# Patient Record
Sex: Female | Born: 1950 | Race: Black or African American | Hispanic: No | Marital: Single
Health system: Midwestern US, Community
[De-identification: ages and names within clinical notes are randomized; demographics above are authoritative.]

## PROBLEM LIST (undated history)

## (undated) DIAGNOSIS — G4730 Sleep apnea, unspecified: Secondary | ICD-10-CM

## (undated) DIAGNOSIS — J449 Chronic obstructive pulmonary disease, unspecified: Secondary | ICD-10-CM

## (undated) DIAGNOSIS — G459 Transient cerebral ischemic attack, unspecified: Secondary | ICD-10-CM

## (undated) DIAGNOSIS — E079 Disorder of thyroid, unspecified: Secondary | ICD-10-CM

## (undated) DIAGNOSIS — E6609 Other obesity due to excess calories: Secondary | ICD-10-CM

## (undated) DIAGNOSIS — G894 Chronic pain syndrome: Secondary | ICD-10-CM

## (undated) DIAGNOSIS — Z9289 Personal history of other medical treatment: Secondary | ICD-10-CM

## (undated) DIAGNOSIS — Z8719 Personal history of other diseases of the digestive system: Secondary | ICD-10-CM

## (undated) DIAGNOSIS — R51 Headache: Secondary | ICD-10-CM

## (undated) DIAGNOSIS — I10 Essential (primary) hypertension: Secondary | ICD-10-CM

## (undated) DIAGNOSIS — R519 Headache, unspecified: Secondary | ICD-10-CM

## (undated) DIAGNOSIS — R0600 Dyspnea, unspecified: Secondary | ICD-10-CM

## (undated) DIAGNOSIS — R252 Cramp and spasm: Secondary | ICD-10-CM

## (undated) DIAGNOSIS — M1990 Unspecified osteoarthritis, unspecified site: Secondary | ICD-10-CM

## (undated) DIAGNOSIS — E785 Hyperlipidemia, unspecified: Secondary | ICD-10-CM

## (undated) DIAGNOSIS — N189 Chronic kidney disease, unspecified: Secondary | ICD-10-CM

## (undated) DIAGNOSIS — J45909 Unspecified asthma, uncomplicated: Secondary | ICD-10-CM

## (undated) DIAGNOSIS — N393 Stress incontinence (female) (male): Secondary | ICD-10-CM

## (undated) DIAGNOSIS — J40 Bronchitis, not specified as acute or chronic: Secondary | ICD-10-CM

## (undated) DIAGNOSIS — K219 Gastro-esophageal reflux disease without esophagitis: Secondary | ICD-10-CM

## (undated) DIAGNOSIS — Z96649 Presence of unspecified artificial hip joint: Secondary | ICD-10-CM

## (undated) DIAGNOSIS — Z9889 Other specified postprocedural states: Secondary | ICD-10-CM

## (undated) DIAGNOSIS — Z972 Presence of dental prosthetic device (complete) (partial): Secondary | ICD-10-CM

## (undated) DIAGNOSIS — D649 Anemia, unspecified: Secondary | ICD-10-CM

## (undated) DIAGNOSIS — E66812 Obesity, class 2: Secondary | ICD-10-CM

## (undated) DIAGNOSIS — I739 Peripheral vascular disease, unspecified: Secondary | ICD-10-CM

## (undated) DIAGNOSIS — E119 Type 2 diabetes mellitus without complications: Secondary | ICD-10-CM

## (undated) DIAGNOSIS — E039 Hypothyroidism, unspecified: Secondary | ICD-10-CM

## (undated) DIAGNOSIS — K08109 Complete loss of teeth, unspecified cause, unspecified class: Secondary | ICD-10-CM

## (undated) DIAGNOSIS — I509 Heart failure, unspecified: Secondary | ICD-10-CM

## (undated) DIAGNOSIS — T84038A Mechanical loosening of other internal prosthetic joint, initial encounter: Secondary | ICD-10-CM

## (undated) DIAGNOSIS — R9439 Abnormal result of other cardiovascular function study: Secondary | ICD-10-CM

## (undated) DIAGNOSIS — I5032 Chronic diastolic (congestive) heart failure: Secondary | ICD-10-CM

## (undated) DIAGNOSIS — Z8614 Personal history of Methicillin resistant Staphylococcus aureus infection: Secondary | ICD-10-CM

## (undated) DIAGNOSIS — Z973 Presence of spectacles and contact lenses: Secondary | ICD-10-CM

## (undated) DIAGNOSIS — Z6836 Body mass index (BMI) 36.0-36.9, adult: Secondary | ICD-10-CM

## (undated) HISTORY — DX: Chronic pain syndrome: G89.4

## (undated) HISTORY — DX: Morbid (severe) obesity due to excess calories: E66.01

## (undated) HISTORY — DX: Anemia, unspecified: D64.9

## (undated) HISTORY — PX: DILATION AND CURETTAGE OF UTERUS: SHX78

## (undated) HISTORY — DX: Peripheral vascular disease, unspecified: I73.9

## (undated) HISTORY — PX: THYROIDECTOMY: SHX17

## (undated) HISTORY — DX: Hypothyroidism, unspecified: E03.9

## (undated) HISTORY — DX: Personal history of Methicillin resistant Staphylococcus aureus infection: Z86.14

## (undated) HISTORY — PX: MULTIPLE TOOTH EXTRACTIONS: SHX2053

## (undated) HISTORY — PX: HERNIA REPAIR: SHX51

## (undated) HISTORY — PX: FOREARM FRACTURE SURGERY: SHX649

## (undated) HISTORY — DX: Disorder of thyroid, unspecified: E07.9

## (undated) HISTORY — DX: Transient cerebral ischemic attack, unspecified: G45.9

## (undated) HISTORY — DX: Unspecified osteoarthritis, unspecified site: M19.90

## (undated) HISTORY — DX: Hyperlipidemia, unspecified: E78.5

## (undated) HISTORY — PX: ABDOMINAL HYSTERECTOMY: SHX81

## (undated) HISTORY — PX: LAPAROSCOPIC CHOLECYSTECTOMY: SUR755

## (undated) HISTORY — DX: Bronchitis, not specified as acute or chronic: J40

## (undated) HISTORY — DX: Essential (primary) hypertension: I10

## (undated) HISTORY — PX: VASCULAR SURGERY: SHX849

## (undated) HISTORY — DX: Gastro-esophageal reflux disease without esophagitis: K21.9

## (undated) HISTORY — DX: Abnormal result of other cardiovascular function study: R94.39

## (undated) HISTORY — PX: JOINT REPLACEMENT: SHX530

---

## 1993-10-08 HISTORY — PX: COLON SURGERY: SHX602

## 2008-08-10 ENCOUNTER — Encounter: Admission: RE | Admit: 2008-08-10 | Discharge: 2008-08-10 | Payer: Self-pay | Admitting: Internal Medicine

## 2008-11-28 ENCOUNTER — Inpatient Hospital Stay (HOSPITAL_COMMUNITY): Admission: EM | Admit: 2008-11-28 | Discharge: 2008-12-16 | Payer: Self-pay | Admitting: *Deleted

## 2008-11-29 ENCOUNTER — Encounter (INDEPENDENT_AMBULATORY_CARE_PROVIDER_SITE_OTHER): Payer: Self-pay | Admitting: General Surgery

## 2009-08-05 ENCOUNTER — Inpatient Hospital Stay (HOSPITAL_COMMUNITY): Admission: RE | Admit: 2009-08-05 | Discharge: 2009-08-09 | Payer: Self-pay | Admitting: Orthopaedic Surgery

## 2009-08-15 ENCOUNTER — Inpatient Hospital Stay (HOSPITAL_COMMUNITY): Admission: EM | Admit: 2009-08-15 | Discharge: 2009-08-19 | Payer: Self-pay | Admitting: Emergency Medicine

## 2009-09-15 ENCOUNTER — Encounter: Admission: RE | Admit: 2009-09-15 | Discharge: 2009-09-15 | Payer: Self-pay | Admitting: Internal Medicine

## 2010-03-15 ENCOUNTER — Ambulatory Visit: Payer: Self-pay | Admitting: Vascular Surgery

## 2010-06-21 ENCOUNTER — Inpatient Hospital Stay (HOSPITAL_COMMUNITY): Admission: EM | Admit: 2010-06-21 | Discharge: 2010-07-01 | Payer: Self-pay | Admitting: Emergency Medicine

## 2010-06-23 ENCOUNTER — Ambulatory Visit: Payer: Self-pay | Admitting: Surgery

## 2010-08-10 ENCOUNTER — Ambulatory Visit: Payer: Self-pay | Admitting: Vascular Surgery

## 2010-09-19 ENCOUNTER — Encounter
Admission: RE | Admit: 2010-09-19 | Discharge: 2010-09-19 | Payer: Self-pay | Source: Home / Self Care | Attending: Internal Medicine | Admitting: Internal Medicine

## 2010-09-21 ENCOUNTER — Ambulatory Visit: Payer: Self-pay | Admitting: Vascular Surgery

## 2010-10-29 ENCOUNTER — Encounter: Payer: Self-pay | Admitting: Internal Medicine

## 2010-11-09 ENCOUNTER — Other Ambulatory Visit: Payer: Self-pay | Admitting: Orthopaedic Surgery

## 2010-11-09 ENCOUNTER — Other Ambulatory Visit (HOSPITAL_COMMUNITY): Payer: Self-pay | Admitting: Orthopaedic Surgery

## 2010-11-09 DIAGNOSIS — Z96649 Presence of unspecified artificial hip joint: Secondary | ICD-10-CM

## 2010-11-09 DIAGNOSIS — M25551 Pain in right hip: Secondary | ICD-10-CM

## 2010-11-09 DIAGNOSIS — T84038A Mechanical loosening of other internal prosthetic joint, initial encounter: Secondary | ICD-10-CM

## 2010-11-17 ENCOUNTER — Other Ambulatory Visit: Payer: Self-pay | Admitting: Internal Medicine

## 2010-11-17 DIAGNOSIS — Z1231 Encounter for screening mammogram for malignant neoplasm of breast: Secondary | ICD-10-CM

## 2010-11-21 ENCOUNTER — Encounter (HOSPITAL_COMMUNITY)
Admission: RE | Admit: 2010-11-21 | Discharge: 2010-11-21 | Disposition: A | Discharge status: Home / Self Care | Payer: Medicare Other | Source: Ambulatory Visit | Attending: Orthopaedic Surgery | Admitting: Orthopaedic Surgery

## 2010-11-21 ENCOUNTER — Ambulatory Visit (HOSPITAL_COMMUNITY)
Admission: RE | Admit: 2010-11-21 | Discharge: 2010-11-21 | Disposition: A | Discharge status: Home / Self Care | Payer: Medicare Other | Source: Ambulatory Visit | Attending: Orthopaedic Surgery | Admitting: Orthopaedic Surgery

## 2010-11-21 ENCOUNTER — Ambulatory Visit
Admission: RE | Admit: 2010-11-21 | Discharge: 2010-11-21 | Disposition: A | Discharge status: Home / Self Care | Payer: Medicare Other | Source: Ambulatory Visit | Attending: Orthopaedic Surgery | Admitting: Orthopaedic Surgery

## 2010-11-21 ENCOUNTER — Encounter (HOSPITAL_COMMUNITY): Payer: Self-pay

## 2010-11-21 DIAGNOSIS — M25551 Pain in right hip: Secondary | ICD-10-CM

## 2010-11-21 DIAGNOSIS — T84038A Mechanical loosening of other internal prosthetic joint, initial encounter: Secondary | ICD-10-CM

## 2010-11-21 DIAGNOSIS — M25559 Pain in unspecified hip: Secondary | ICD-10-CM | POA: Insufficient documentation

## 2010-11-21 DIAGNOSIS — Z96649 Presence of unspecified artificial hip joint: Secondary | ICD-10-CM | POA: Insufficient documentation

## 2010-11-21 HISTORY — DX: Presence of unspecified artificial hip joint: Z96.649

## 2010-11-21 MED ORDER — TECHNETIUM TC 99M MEDRONATE IV KIT
24.0000 | PACK | Freq: Once | INTRAVENOUS | Status: AC | PRN
  Administered 2010-11-21: 24 via INTRAVENOUS

## 2010-11-23 ENCOUNTER — Ambulatory Visit
Admission: RE | Admit: 2010-11-23 | Discharge: 2010-11-23 | Disposition: A | Discharge status: Home / Self Care | Payer: Medicare Other | Source: Ambulatory Visit | Attending: Internal Medicine | Admitting: Internal Medicine

## 2010-11-23 DIAGNOSIS — Z1231 Encounter for screening mammogram for malignant neoplasm of breast: Secondary | ICD-10-CM

## 2010-11-29 ENCOUNTER — Other Ambulatory Visit: Payer: Self-pay | Admitting: Orthopaedic Surgery

## 2010-11-29 DIAGNOSIS — M4316 Spondylolisthesis, lumbar region: Secondary | ICD-10-CM

## 2010-12-06 ENCOUNTER — Ambulatory Visit
Admission: RE | Admit: 2010-12-06 | Discharge: 2010-12-06 | Disposition: A | Discharge status: Home / Self Care | Payer: Medicare Other | Source: Ambulatory Visit | Attending: Orthopaedic Surgery | Admitting: Orthopaedic Surgery

## 2010-12-06 DIAGNOSIS — M4316 Spondylolisthesis, lumbar region: Secondary | ICD-10-CM

## 2010-12-21 ENCOUNTER — Ambulatory Visit: Payer: Self-pay | Admitting: Vascular Surgery

## 2010-12-21 LAB — GLUCOSE, CAPILLARY
Glucose-Capillary: 111 mg/dL — ABNORMAL HIGH (ref 70–99)
Glucose-Capillary: 116 mg/dL — ABNORMAL HIGH (ref 70–99)
Glucose-Capillary: 116 mg/dL — ABNORMAL HIGH (ref 70–99)
Glucose-Capillary: 117 mg/dL — ABNORMAL HIGH (ref 70–99)
Glucose-Capillary: 124 mg/dL — ABNORMAL HIGH (ref 70–99)
Glucose-Capillary: 124 mg/dL — ABNORMAL HIGH (ref 70–99)
Glucose-Capillary: 128 mg/dL — ABNORMAL HIGH (ref 70–99)
Glucose-Capillary: 130 mg/dL — ABNORMAL HIGH (ref 70–99)
Glucose-Capillary: 131 mg/dL — ABNORMAL HIGH (ref 70–99)
Glucose-Capillary: 133 mg/dL — ABNORMAL HIGH (ref 70–99)
Glucose-Capillary: 136 mg/dL — ABNORMAL HIGH (ref 70–99)
Glucose-Capillary: 137 mg/dL — ABNORMAL HIGH (ref 70–99)
Glucose-Capillary: 138 mg/dL — ABNORMAL HIGH (ref 70–99)
Glucose-Capillary: 142 mg/dL — ABNORMAL HIGH (ref 70–99)
Glucose-Capillary: 148 mg/dL — ABNORMAL HIGH (ref 70–99)
Glucose-Capillary: 150 mg/dL — ABNORMAL HIGH (ref 70–99)
Glucose-Capillary: 159 mg/dL — ABNORMAL HIGH (ref 70–99)
Glucose-Capillary: 159 mg/dL — ABNORMAL HIGH (ref 70–99)
Glucose-Capillary: 160 mg/dL — ABNORMAL HIGH (ref 70–99)
Glucose-Capillary: 164 mg/dL — ABNORMAL HIGH (ref 70–99)
Glucose-Capillary: 172 mg/dL — ABNORMAL HIGH (ref 70–99)
Glucose-Capillary: 173 mg/dL — ABNORMAL HIGH (ref 70–99)
Glucose-Capillary: 174 mg/dL — ABNORMAL HIGH (ref 70–99)
Glucose-Capillary: 177 mg/dL — ABNORMAL HIGH (ref 70–99)
Glucose-Capillary: 183 mg/dL — ABNORMAL HIGH (ref 70–99)
Glucose-Capillary: 187 mg/dL — ABNORMAL HIGH (ref 70–99)
Glucose-Capillary: 196 mg/dL — ABNORMAL HIGH (ref 70–99)
Glucose-Capillary: 198 mg/dL — ABNORMAL HIGH (ref 70–99)
Glucose-Capillary: 199 mg/dL — ABNORMAL HIGH (ref 70–99)
Glucose-Capillary: 201 mg/dL — ABNORMAL HIGH (ref 70–99)
Glucose-Capillary: 207 mg/dL — ABNORMAL HIGH (ref 70–99)
Glucose-Capillary: 210 mg/dL — ABNORMAL HIGH (ref 70–99)
Glucose-Capillary: 223 mg/dL — ABNORMAL HIGH (ref 70–99)
Glucose-Capillary: 252 mg/dL — ABNORMAL HIGH (ref 70–99)
Glucose-Capillary: 326 mg/dL — ABNORMAL HIGH (ref 70–99)
Glucose-Capillary: 348 mg/dL — ABNORMAL HIGH (ref 70–99)
Glucose-Capillary: 361 mg/dL — ABNORMAL HIGH (ref 70–99)
Glucose-Capillary: 89 mg/dL (ref 70–99)
Glucose-Capillary: 94 mg/dL (ref 70–99)
Glucose-Capillary: 186 mg/dL — ABNORMAL HIGH (ref 70–99)
Glucose-Capillary: 190 mg/dL — ABNORMAL HIGH (ref 70–99)
Glucose-Capillary: 198 mg/dL — ABNORMAL HIGH (ref 70–99)

## 2010-12-21 LAB — URINE MICROSCOPIC-ADD ON

## 2010-12-21 LAB — BASIC METABOLIC PANEL
BUN: 12 mg/dL (ref 6–23)
BUN: 12 mg/dL (ref 6–23)
BUN: 14 mg/dL (ref 6–23)
BUN: 19 mg/dL (ref 6–23)
BUN: 19 mg/dL (ref 6–23)
BUN: 20 mg/dL (ref 6–23)
BUN: 22 mg/dL (ref 6–23)
BUN: 28 mg/dL — ABNORMAL HIGH (ref 6–23)
BUN: 30 mg/dL — ABNORMAL HIGH (ref 6–23)
CO2: 28 mEq/L (ref 19–32)
CO2: 29 mEq/L (ref 19–32)
CO2: 29 mEq/L (ref 19–32)
CO2: 31 mEq/L (ref 19–32)
CO2: 31 mEq/L (ref 19–32)
CO2: 32 mEq/L (ref 19–32)
CO2: 32 mEq/L (ref 19–32)
CO2: 33 mEq/L — ABNORMAL HIGH (ref 19–32)
CO2: 33 mEq/L — ABNORMAL HIGH (ref 19–32)
Calcium: 8.4 mg/dL (ref 8.4–10.5)
Calcium: 8.8 mg/dL (ref 8.4–10.5)
Calcium: 8.8 mg/dL (ref 8.4–10.5)
Calcium: 9 mg/dL (ref 8.4–10.5)
Calcium: 9 mg/dL (ref 8.4–10.5)
Calcium: 9 mg/dL (ref 8.4–10.5)
Calcium: 9.1 mg/dL (ref 8.4–10.5)
Calcium: 9.2 mg/dL (ref 8.4–10.5)
Calcium: 9.3 mg/dL (ref 8.4–10.5)
Chloride: 100 mEq/L (ref 96–112)
Chloride: 100 mEq/L (ref 96–112)
Chloride: 100 mEq/L (ref 96–112)
Chloride: 100 mEq/L (ref 96–112)
Chloride: 101 mEq/L (ref 96–112)
Chloride: 101 mEq/L (ref 96–112)
Chloride: 101 mEq/L (ref 96–112)
Chloride: 103 mEq/L (ref 96–112)
Chloride: 99 mEq/L (ref 96–112)
Creatinine, Ser: 1.07 mg/dL (ref 0.4–1.2)
Creatinine, Ser: 1.07 mg/dL (ref 0.4–1.2)
Creatinine, Ser: 1.15 mg/dL (ref 0.4–1.2)
Creatinine, Ser: 1.23 mg/dL — ABNORMAL HIGH (ref 0.4–1.2)
Creatinine, Ser: 1.32 mg/dL — ABNORMAL HIGH (ref 0.4–1.2)
Creatinine, Ser: 1.35 mg/dL — ABNORMAL HIGH (ref 0.4–1.2)
Creatinine, Ser: 1.39 mg/dL — ABNORMAL HIGH (ref 0.4–1.2)
Creatinine, Ser: 1.42 mg/dL — ABNORMAL HIGH (ref 0.4–1.2)
Creatinine, Ser: 1.47 mg/dL — ABNORMAL HIGH (ref 0.4–1.2)
GFR calc Af Amer: 44 mL/min — ABNORMAL LOW (ref >60)
GFR calc Af Amer: 46 mL/min — ABNORMAL LOW (ref >60)
GFR calc Af Amer: 47 mL/min — ABNORMAL LOW (ref >60)
GFR calc Af Amer: 49 mL/min — ABNORMAL LOW (ref >60)
GFR calc Af Amer: 50 mL/min — ABNORMAL LOW (ref >60)
GFR calc Af Amer: 54 mL/min — ABNORMAL LOW (ref >60)
GFR calc Af Amer: 58 mL/min — ABNORMAL LOW (ref >60)
GFR calc Af Amer: >60 mL/min (ref >60)
GFR calc Af Amer: >60 mL/min (ref >60)
GFR calc non Af Amer: 36 mL/min — ABNORMAL LOW (ref >60)
GFR calc non Af Amer: 38 mL/min — ABNORMAL LOW (ref >60)
GFR calc non Af Amer: 39 mL/min — ABNORMAL LOW (ref >60)
GFR calc non Af Amer: 40 mL/min — ABNORMAL LOW (ref >60)
GFR calc non Af Amer: 41 mL/min — ABNORMAL LOW (ref >60)
GFR calc non Af Amer: 45 mL/min — ABNORMAL LOW (ref >60)
GFR calc non Af Amer: 48 mL/min — ABNORMAL LOW (ref >60)
GFR calc non Af Amer: 52 mL/min — ABNORMAL LOW (ref >60)
GFR calc non Af Amer: 52 mL/min — ABNORMAL LOW (ref >60)
Glucose, Bld: 111 mg/dL — ABNORMAL HIGH (ref 70–99)
Glucose, Bld: 146 mg/dL — ABNORMAL HIGH (ref 70–99)
Glucose, Bld: 147 mg/dL — ABNORMAL HIGH (ref 70–99)
Glucose, Bld: 166 mg/dL — ABNORMAL HIGH (ref 70–99)
Glucose, Bld: 181 mg/dL — ABNORMAL HIGH (ref 70–99)
Glucose, Bld: 182 mg/dL — ABNORMAL HIGH (ref 70–99)
Glucose, Bld: 191 mg/dL — ABNORMAL HIGH (ref 70–99)
Glucose, Bld: 194 mg/dL — ABNORMAL HIGH (ref 70–99)
Glucose, Bld: 90 mg/dL (ref 70–99)
Potassium: 3.2 mEq/L — ABNORMAL LOW (ref 3.5–5.1)
Potassium: 3.3 mEq/L — ABNORMAL LOW (ref 3.5–5.1)
Potassium: 3.3 mEq/L — ABNORMAL LOW (ref 3.5–5.1)
Potassium: 3.5 mEq/L (ref 3.5–5.1)
Potassium: 3.6 mEq/L (ref 3.5–5.1)
Potassium: 3.6 mEq/L (ref 3.5–5.1)
Potassium: 3.8 mEq/L (ref 3.5–5.1)
Potassium: 4 mEq/L (ref 3.5–5.1)
Potassium: 4.5 mEq/L (ref 3.5–5.1)
Sodium: 137 mEq/L (ref 135–145)
Sodium: 139 mEq/L (ref 135–145)
Sodium: 140 mEq/L (ref 135–145)
Sodium: 141 mEq/L (ref 135–145)
Sodium: 141 mEq/L (ref 135–145)
Sodium: 142 mEq/L (ref 135–145)
Sodium: 142 mEq/L (ref 135–145)
Sodium: 142 mEq/L (ref 135–145)
Sodium: 143 mEq/L (ref 135–145)

## 2010-12-21 LAB — DIFFERENTIAL
Basophils Absolute: 0 10*3/uL (ref 0.0–0.1)
Basophils Relative: 0 % (ref 0–1)
Eosinophils Absolute: 0.1 10*3/uL (ref 0.0–0.7)
Eosinophils Relative: 1 % (ref 0–5)
Lymphocytes Relative: 15 % (ref 12–46)
Lymphs Abs: 2.6 10*3/uL (ref 0.7–4.0)
Monocytes Absolute: 0.7 10*3/uL (ref 0.1–1.0)
Monocytes Relative: 4 % (ref 3–12)
Neutro Abs: 14 10*3/uL — ABNORMAL HIGH (ref 1.7–7.7)
Neutrophils Relative %: 80 % — ABNORMAL HIGH (ref 43–77)

## 2010-12-21 LAB — URINALYSIS, ROUTINE W REFLEX MICROSCOPIC
Bilirubin Urine: NEGATIVE
Glucose, UA: NEGATIVE mg/dL
Hgb urine dipstick: NEGATIVE
Ketones, ur: NEGATIVE mg/dL
Leukocytes, UA: NEGATIVE
Nitrite: NEGATIVE
Protein, ur: 100 mg/dL — AB
Specific Gravity, Urine: 1.024 (ref 1.005–1.030)
Urobilinogen, UA: 0.2 mg/dL (ref 0.0–1.0)
pH: 6 (ref 5.0–8.0)

## 2010-12-21 LAB — CBC
HCT: 33.4 % — ABNORMAL LOW (ref 36.0–46.0)
HCT: 34.3 % — ABNORMAL LOW (ref 36.0–46.0)
HCT: 35.5 % — ABNORMAL LOW (ref 36.0–46.0)
HCT: 35.7 % — ABNORMAL LOW (ref 36.0–46.0)
HCT: 36.3 % (ref 36.0–46.0)
HCT: 38.2 % (ref 36.0–46.0)
HCT: 39.1 % (ref 36.0–46.0)
HCT: 40.6 % (ref 36.0–46.0)
Hemoglobin: 10.6 g/dL — ABNORMAL LOW (ref 12.0–15.0)
Hemoglobin: 10.8 g/dL — ABNORMAL LOW (ref 12.0–15.0)
Hemoglobin: 11.1 g/dL — ABNORMAL LOW (ref 12.0–15.0)
Hemoglobin: 11.1 g/dL — ABNORMAL LOW (ref 12.0–15.0)
Hemoglobin: 11.3 g/dL — ABNORMAL LOW (ref 12.0–15.0)
Hemoglobin: 11.7 g/dL — ABNORMAL LOW (ref 12.0–15.0)
Hemoglobin: 12.3 g/dL (ref 12.0–15.0)
Hemoglobin: 13.2 g/dL (ref 12.0–15.0)
MCH: 26.2 pg (ref 26.0–34.0)
MCH: 26.3 pg (ref 26.0–34.0)
MCH: 26.5 pg (ref 26.0–34.0)
MCH: 26.8 pg (ref 26.0–34.0)
MCH: 27 pg (ref 26.0–34.0)
MCH: 27 pg (ref 26.0–34.0)
MCH: 27.1 pg (ref 26.0–34.0)
MCH: 27.6 pg (ref 26.0–34.0)
MCHC: 30.6 g/dL (ref 30.0–36.0)
MCHC: 31.1 g/dL (ref 30.0–36.0)
MCHC: 31.1 g/dL (ref 30.0–36.0)
MCHC: 31.3 g/dL (ref 30.0–36.0)
MCHC: 31.5 g/dL (ref 30.0–36.0)
MCHC: 31.5 g/dL (ref 30.0–36.0)
MCHC: 31.7 g/dL (ref 30.0–36.0)
MCHC: 32.5 g/dL (ref 30.0–36.0)
MCV: 84.4 fL (ref 78.0–100.0)
MCV: 84.7 fL (ref 78.0–100.0)
MCV: 85.1 fL (ref 78.0–100.0)
MCV: 85.4 fL (ref 78.0–100.0)
MCV: 85.7 fL (ref 78.0–100.0)
MCV: 85.8 fL (ref 78.0–100.0)
MCV: 86.6 fL (ref 78.0–100.0)
MCV: 84.9 fL (ref 78.0–100.0)
Platelets: 329 10*3/uL (ref 150–400)
Platelets: 332 10*3/uL (ref 150–400)
Platelets: 336 10*3/uL (ref 150–400)
Platelets: 343 10*3/uL (ref 150–400)
Platelets: 353 10*3/uL (ref 150–400)
Platelets: 354 10*3/uL (ref 150–400)
Platelets: 363 10*3/uL (ref 150–400)
Platelets: 400 10*3/uL (ref 150–400)
RBC: 3.91 MIL/uL (ref 3.87–5.11)
RBC: 4.03 MIL/uL (ref 3.87–5.11)
RBC: 4.19 MIL/uL (ref 3.87–5.11)
RBC: 4.19 MIL/uL (ref 3.87–5.11)
RBC: 4.23 MIL/uL (ref 3.87–5.11)
RBC: 4.45 MIL/uL (ref 3.87–5.11)
RBC: 4.56 MIL/uL (ref 3.87–5.11)
RBC: 4.78 MIL/uL (ref 3.87–5.11)
RDW: 14.8 % (ref 11.5–15.5)
RDW: 15 % (ref 11.5–15.5)
RDW: 15.1 % (ref 11.5–15.5)
RDW: 15.2 % (ref 11.5–15.5)
RDW: 15.2 % (ref 11.5–15.5)
RDW: 15.3 % (ref 11.5–15.5)
RDW: 15.4 % (ref 11.5–15.5)
RDW: 14.9 % (ref 11.5–15.5)
WBC: 14 10*3/uL — ABNORMAL HIGH (ref 4.0–10.5)
WBC: 15.8 10*3/uL — ABNORMAL HIGH (ref 4.0–10.5)
WBC: 15.8 10*3/uL — ABNORMAL HIGH (ref 4.0–10.5)
WBC: 16 10*3/uL — ABNORMAL HIGH (ref 4.0–10.5)
WBC: 16.6 10*3/uL — ABNORMAL HIGH (ref 4.0–10.5)
WBC: 17.1 10*3/uL — ABNORMAL HIGH (ref 4.0–10.5)
WBC: 17.2 10*3/uL — ABNORMAL HIGH (ref 4.0–10.5)
WBC: 17.4 10*3/uL — ABNORMAL HIGH (ref 4.0–10.5)

## 2010-12-21 LAB — COMPREHENSIVE METABOLIC PANEL
ALT: 10 U/L (ref 0–35)
AST: 20 U/L (ref 0–37)
Albumin: 3.1 g/dL — ABNORMAL LOW (ref 3.5–5.2)
Alkaline Phosphatase: 101 U/L (ref 39–117)
BUN: 16 mg/dL (ref 6–23)
CO2: 30 mEq/L (ref 19–32)
Calcium: 9.2 mg/dL (ref 8.4–10.5)
Chloride: 97 mEq/L (ref 96–112)
Creatinine, Ser: 1.53 mg/dL — ABNORMAL HIGH (ref 0.4–1.2)
GFR calc Af Amer: 42 mL/min — ABNORMAL LOW (ref >60)
GFR calc non Af Amer: 35 mL/min — ABNORMAL LOW (ref >60)
Glucose, Bld: 199 mg/dL — ABNORMAL HIGH (ref 70–99)
Potassium: 2.8 mEq/L — ABNORMAL LOW (ref 3.5–5.1)
Sodium: 142 mEq/L (ref 135–145)
Total Bilirubin: 0.6 mg/dL (ref 0.3–1.2)
Total Protein: 7.9 g/dL (ref 6.0–8.3)

## 2010-12-21 LAB — CULTURE, BLOOD (ROUTINE X 2)
Culture  Setup Time: 201109150101
Culture  Setup Time: 201109150101
Culture: NO GROWTH
Culture: NO GROWTH

## 2010-12-21 LAB — VANCOMYCIN, TROUGH: Vancomycin Tr: 19.6 ug/mL (ref 10.0–20.0)

## 2010-12-21 LAB — URIC ACID
Uric Acid, Serum: 4.2 mg/dL (ref 2.4–7.0)
Uric Acid, Serum: 4.5 mg/dL (ref 2.4–7.0)

## 2010-12-21 LAB — HEMOGLOBIN A1C
Hgb A1c MFr Bld: 7.2 % — ABNORMAL HIGH (ref <5.7)
Mean Plasma Glucose: 160 mg/dL — ABNORMAL HIGH (ref <117)

## 2010-12-21 LAB — TSH: TSH: 2.141 u[IU]/mL (ref 0.350–4.500)

## 2010-12-21 LAB — MRSA PCR SCREENING: MRSA by PCR: NEGATIVE

## 2010-12-28 ENCOUNTER — Ambulatory Visit: Payer: Medicare Other | Admitting: Vascular Surgery

## 2011-01-10 LAB — GLUCOSE, CAPILLARY
Glucose-Capillary: 106 mg/dL — ABNORMAL HIGH (ref 70–99)
Glucose-Capillary: 109 mg/dL — ABNORMAL HIGH (ref 70–99)
Glucose-Capillary: 113 mg/dL — ABNORMAL HIGH (ref 70–99)
Glucose-Capillary: 121 mg/dL — ABNORMAL HIGH (ref 70–99)
Glucose-Capillary: 122 mg/dL — ABNORMAL HIGH (ref 70–99)
Glucose-Capillary: 136 mg/dL — ABNORMAL HIGH (ref 70–99)
Glucose-Capillary: 139 mg/dL — ABNORMAL HIGH (ref 70–99)
Glucose-Capillary: 139 mg/dL — ABNORMAL HIGH (ref 70–99)
Glucose-Capillary: 143 mg/dL — ABNORMAL HIGH (ref 70–99)
Glucose-Capillary: 146 mg/dL — ABNORMAL HIGH (ref 70–99)
Glucose-Capillary: 150 mg/dL — ABNORMAL HIGH (ref 70–99)
Glucose-Capillary: 156 mg/dL — ABNORMAL HIGH (ref 70–99)
Glucose-Capillary: 163 mg/dL — ABNORMAL HIGH (ref 70–99)
Glucose-Capillary: 175 mg/dL — ABNORMAL HIGH (ref 70–99)
Glucose-Capillary: 177 mg/dL — ABNORMAL HIGH (ref 70–99)
Glucose-Capillary: 196 mg/dL — ABNORMAL HIGH (ref 70–99)
Glucose-Capillary: 197 mg/dL — ABNORMAL HIGH (ref 70–99)
Glucose-Capillary: 200 mg/dL — ABNORMAL HIGH (ref 70–99)
Glucose-Capillary: 213 mg/dL — ABNORMAL HIGH (ref 70–99)
Glucose-Capillary: 284 mg/dL — ABNORMAL HIGH (ref 70–99)
Glucose-Capillary: 87 mg/dL (ref 70–99)
Glucose-Capillary: 88 mg/dL (ref 70–99)

## 2011-01-10 LAB — COMPREHENSIVE METABOLIC PANEL
ALT: 16 U/L (ref 0–35)
AST: 20 U/L (ref 0–37)
Albumin: 1.9 g/dL — ABNORMAL LOW (ref 3.5–5.2)
Alkaline Phosphatase: 105 U/L (ref 39–117)
BUN: 14 mg/dL (ref 6–23)
CO2: 29 mEq/L (ref 19–32)
Calcium: 8.2 mg/dL — ABNORMAL LOW (ref 8.4–10.5)
Chloride: 98 mEq/L (ref 96–112)
Creatinine, Ser: 1.13 mg/dL (ref 0.4–1.2)
GFR calc Af Amer: 60 mL/min — ABNORMAL LOW (ref >60)
GFR calc non Af Amer: 49 mL/min — ABNORMAL LOW (ref >60)
Glucose, Bld: 226 mg/dL — ABNORMAL HIGH (ref 70–99)
Potassium: 2.9 mEq/L — ABNORMAL LOW (ref 3.5–5.1)
Sodium: 134 mEq/L — ABNORMAL LOW (ref 135–145)
Total Bilirubin: 0.6 mg/dL (ref 0.3–1.2)
Total Protein: 5.8 g/dL — ABNORMAL LOW (ref 6.0–8.3)

## 2011-01-10 LAB — CBC
HCT: 23.2 % — ABNORMAL LOW (ref 36.0–46.0)
HCT: 23.8 % — ABNORMAL LOW (ref 36.0–46.0)
HCT: 26 % — ABNORMAL LOW (ref 36.0–46.0)
HCT: 26.7 % — ABNORMAL LOW (ref 36.0–46.0)
HCT: 27 % — ABNORMAL LOW (ref 36.0–46.0)
HCT: 27.4 % — ABNORMAL LOW (ref 36.0–46.0)
Hemoglobin: 7.7 g/dL — ABNORMAL LOW (ref 12.0–15.0)
Hemoglobin: 8 g/dL — ABNORMAL LOW (ref 12.0–15.0)
Hemoglobin: 8.4 g/dL — ABNORMAL LOW (ref 12.0–15.0)
Hemoglobin: 8.8 g/dL — ABNORMAL LOW (ref 12.0–15.0)
Hemoglobin: 9.1 g/dL — ABNORMAL LOW (ref 12.0–15.0)
Hemoglobin: 9.1 g/dL — ABNORMAL LOW (ref 12.0–15.0)
MCHC: 32.5 g/dL (ref 30.0–36.0)
MCHC: 33.1 g/dL (ref 30.0–36.0)
MCHC: 33.1 g/dL (ref 30.0–36.0)
MCHC: 33.2 g/dL (ref 30.0–36.0)
MCHC: 33.6 g/dL (ref 30.0–36.0)
MCHC: 33.7 g/dL (ref 30.0–36.0)
MCV: 83.1 fL (ref 78.0–100.0)
MCV: 83.1 fL (ref 78.0–100.0)
MCV: 83.1 fL (ref 78.0–100.0)
MCV: 84.3 fL (ref 78.0–100.0)
MCV: 85.1 fL (ref 78.0–100.0)
MCV: 85.6 fL (ref 78.0–100.0)
Platelets: 249 10*3/uL (ref 150–400)
Platelets: 251 10*3/uL (ref 150–400)
Platelets: 377 10*3/uL (ref 150–400)
Platelets: 385 10*3/uL (ref 150–400)
Platelets: 490 10*3/uL — ABNORMAL HIGH (ref 150–400)
Platelets: 506 10*3/uL — ABNORMAL HIGH (ref 150–400)
RBC: 2.75 MIL/uL — ABNORMAL LOW (ref 3.87–5.11)
RBC: 2.87 MIL/uL — ABNORMAL LOW (ref 3.87–5.11)
RBC: 3.04 MIL/uL — ABNORMAL LOW (ref 3.87–5.11)
RBC: 3.21 MIL/uL — ABNORMAL LOW (ref 3.87–5.11)
RBC: 3.23 MIL/uL — ABNORMAL LOW (ref 3.87–5.11)
RBC: 3.25 MIL/uL — ABNORMAL LOW (ref 3.87–5.11)
RDW: 14.6 % (ref 11.5–15.5)
RDW: 15 % (ref 11.5–15.5)
RDW: 15 % (ref 11.5–15.5)
RDW: 15.1 % (ref 11.5–15.5)
RDW: 15.1 % (ref 11.5–15.5)
RDW: 15.4 % (ref 11.5–15.5)
WBC: 10.5 10*3/uL (ref 4.0–10.5)
WBC: 12.9 10*3/uL — ABNORMAL HIGH (ref 4.0–10.5)
WBC: 15.5 10*3/uL — ABNORMAL HIGH (ref 4.0–10.5)
WBC: 19.4 10*3/uL — ABNORMAL HIGH (ref 4.0–10.5)
WBC: 19.4 10*3/uL — ABNORMAL HIGH (ref 4.0–10.5)
WBC: 28.4 10*3/uL — ABNORMAL HIGH (ref 4.0–10.5)

## 2011-01-10 LAB — URINE CULTURE: Colony Count: >=100000

## 2011-01-10 LAB — BASIC METABOLIC PANEL
BUN: 15 mg/dL (ref 6–23)
BUN: 19 mg/dL (ref 6–23)
BUN: 20 mg/dL (ref 6–23)
BUN: 24 mg/dL — ABNORMAL HIGH (ref 6–23)
BUN: 25 mg/dL — ABNORMAL HIGH (ref 6–23)
CO2: 25 mEq/L (ref 19–32)
CO2: 25 mEq/L (ref 19–32)
CO2: 26 mEq/L (ref 19–32)
CO2: 26 mEq/L (ref 19–32)
CO2: 27 mEq/L (ref 19–32)
Calcium: 8.1 mg/dL — ABNORMAL LOW (ref 8.4–10.5)
Calcium: 8.2 mg/dL — ABNORMAL LOW (ref 8.4–10.5)
Calcium: 8.4 mg/dL (ref 8.4–10.5)
Calcium: 8.5 mg/dL (ref 8.4–10.5)
Calcium: 8.6 mg/dL (ref 8.4–10.5)
Chloride: 102 mEq/L (ref 96–112)
Chloride: 104 mEq/L (ref 96–112)
Chloride: 104 mEq/L (ref 96–112)
Chloride: 105 mEq/L (ref 96–112)
Chloride: 91 mEq/L — ABNORMAL LOW (ref 96–112)
Creatinine, Ser: 1.13 mg/dL (ref 0.4–1.2)
Creatinine, Ser: 1.15 mg/dL (ref 0.4–1.2)
Creatinine, Ser: 1.42 mg/dL — ABNORMAL HIGH (ref 0.4–1.2)
Creatinine, Ser: 1.51 mg/dL — ABNORMAL HIGH (ref 0.4–1.2)
Creatinine, Ser: 1.64 mg/dL — ABNORMAL HIGH (ref 0.4–1.2)
GFR calc Af Amer: 39 mL/min — ABNORMAL LOW (ref >60)
GFR calc Af Amer: 43 mL/min — ABNORMAL LOW (ref >60)
GFR calc Af Amer: 46 mL/min — ABNORMAL LOW (ref >60)
GFR calc Af Amer: 59 mL/min — ABNORMAL LOW (ref >60)
GFR calc Af Amer: 60 mL/min — ABNORMAL LOW (ref >60)
GFR calc non Af Amer: 32 mL/min — ABNORMAL LOW (ref >60)
GFR calc non Af Amer: 35 mL/min — ABNORMAL LOW (ref >60)
GFR calc non Af Amer: 38 mL/min — ABNORMAL LOW (ref >60)
GFR calc non Af Amer: 48 mL/min — ABNORMAL LOW (ref >60)
GFR calc non Af Amer: 49 mL/min — ABNORMAL LOW (ref >60)
Glucose, Bld: 112 mg/dL — ABNORMAL HIGH (ref 70–99)
Glucose, Bld: 141 mg/dL — ABNORMAL HIGH (ref 70–99)
Glucose, Bld: 166 mg/dL — ABNORMAL HIGH (ref 70–99)
Glucose, Bld: 215 mg/dL — ABNORMAL HIGH (ref 70–99)
Glucose, Bld: 68 mg/dL — ABNORMAL LOW (ref 70–99)
Potassium: 2.7 mEq/L — CL (ref 3.5–5.1)
Potassium: 3.6 mEq/L (ref 3.5–5.1)
Potassium: 3.7 mEq/L (ref 3.5–5.1)
Potassium: 3.9 mEq/L (ref 3.5–5.1)
Potassium: 4 mEq/L (ref 3.5–5.1)
Sodium: 131 mEq/L — ABNORMAL LOW (ref 135–145)
Sodium: 135 mEq/L (ref 135–145)
Sodium: 137 mEq/L (ref 135–145)
Sodium: 137 mEq/L (ref 135–145)
Sodium: 139 mEq/L (ref 135–145)

## 2011-01-10 LAB — URINALYSIS, ROUTINE W REFLEX MICROSCOPIC
Bilirubin Urine: NEGATIVE
Glucose, UA: NEGATIVE mg/dL
Ketones, ur: NEGATIVE mg/dL
Nitrite: POSITIVE — AB
Protein, ur: >300 mg/dL — AB
Specific Gravity, Urine: 1.015 (ref 1.005–1.030)
Urobilinogen, UA: 1 mg/dL (ref 0.0–1.0)
pH: 6 (ref 5.0–8.0)

## 2011-01-10 LAB — CULTURE, BLOOD (ROUTINE X 2)

## 2011-01-10 LAB — DIFFERENTIAL
Basophils Absolute: 0 10*3/uL (ref 0.0–0.1)
Basophils Absolute: 0 10*3/uL (ref 0.0–0.1)
Basophils Relative: 0 % (ref 0–1)
Basophils Relative: 0 % (ref 0–1)
Eosinophils Absolute: 0 10*3/uL (ref 0.0–0.7)
Eosinophils Absolute: 0.1 10*3/uL (ref 0.0–0.7)
Eosinophils Relative: 0 % (ref 0–5)
Eosinophils Relative: 0 % (ref 0–5)
Lymphocytes Relative: 3 % — ABNORMAL LOW (ref 12–46)
Lymphocytes Relative: 3 % — ABNORMAL LOW (ref 12–46)
Lymphs Abs: 0.6 10*3/uL — ABNORMAL LOW (ref 0.7–4.0)
Lymphs Abs: 0.9 10*3/uL (ref 0.7–4.0)
Monocytes Absolute: 0.6 10*3/uL (ref 0.1–1.0)
Monocytes Absolute: 0.7 10*3/uL (ref 0.1–1.0)
Monocytes Relative: 2 % — ABNORMAL LOW (ref 3–12)
Monocytes Relative: 4 % (ref 3–12)
Neutro Abs: 18 10*3/uL — ABNORMAL HIGH (ref 1.7–7.7)
Neutro Abs: 26.9 10*3/uL — ABNORMAL HIGH (ref 1.7–7.7)
Neutrophils Relative %: 93 % — ABNORMAL HIGH (ref 43–77)
Neutrophils Relative %: 95 % — ABNORMAL HIGH (ref 43–77)

## 2011-01-10 LAB — URINE MICROSCOPIC-ADD ON

## 2011-01-10 LAB — CARDIAC PANEL(CRET KIN+CKTOT+MB+TROPI)
CK, MB: 1 ng/mL (ref 0.3–4.0)
Relative Index: INVALID (ref 0.0–2.5)
Total CK: 40 U/L (ref 7–177)
Troponin I: 0.03 ng/mL (ref 0.00–0.06)

## 2011-01-10 LAB — CK TOTAL AND CKMB (NOT AT ARMC)
CK, MB: 1.7 ng/mL (ref 0.3–4.0)
Relative Index: INVALID (ref 0.0–2.5)
Total CK: 99 U/L (ref 7–177)

## 2011-01-10 LAB — PROTIME-INR
INR: 1.18 (ref 0.00–1.49)
INR: 1.21 (ref 0.00–1.49)
INR: 1.71 — ABNORMAL HIGH (ref 0.00–1.49)
Prothrombin Time: 14.9 seconds (ref 11.6–15.2)
Prothrombin Time: 15.2 seconds (ref 11.6–15.2)
Prothrombin Time: 19.9 seconds — ABNORMAL HIGH (ref 11.6–15.2)

## 2011-01-10 LAB — HEMOGLOBIN A1C
Hgb A1c MFr Bld: 6.8 % — ABNORMAL HIGH (ref 4.6–6.1)
Mean Plasma Glucose: 148 mg/dL

## 2011-01-10 LAB — VANCOMYCIN, TROUGH: Vancomycin Tr: 24.2 ug/mL — ABNORMAL HIGH (ref 10.0–20.0)

## 2011-01-10 LAB — TROPONIN I: Troponin I: 0.06 ng/mL (ref 0.00–0.06)

## 2011-01-11 LAB — BASIC METABOLIC PANEL
BUN: 21 mg/dL (ref 6–23)
BUN: 22 mg/dL (ref 6–23)
BUN: 29 mg/dL — ABNORMAL HIGH (ref 6–23)
CO2: 25 mEq/L (ref 19–32)
CO2: 26 mEq/L (ref 19–32)
CO2: 26 mEq/L (ref 19–32)
Calcium: 8.1 mg/dL — ABNORMAL LOW (ref 8.4–10.5)
Calcium: 8.5 mg/dL (ref 8.4–10.5)
Calcium: 9.4 mg/dL (ref 8.4–10.5)
Chloride: 103 mEq/L (ref 96–112)
Chloride: 105 mEq/L (ref 96–112)
Chloride: 109 mEq/L (ref 96–112)
Creatinine, Ser: 1 mg/dL (ref 0.4–1.2)
Creatinine, Ser: 1.17 mg/dL (ref 0.4–1.2)
Creatinine, Ser: 1.34 mg/dL — ABNORMAL HIGH (ref 0.4–1.2)
GFR calc Af Amer: 49 mL/min — ABNORMAL LOW (ref >60)
GFR calc Af Amer: 57 mL/min — ABNORMAL LOW (ref >60)
GFR calc Af Amer: >60 mL/min (ref >60)
GFR calc non Af Amer: 41 mL/min — ABNORMAL LOW (ref >60)
GFR calc non Af Amer: 48 mL/min — ABNORMAL LOW (ref >60)
GFR calc non Af Amer: 57 mL/min — ABNORMAL LOW (ref >60)
Glucose, Bld: 158 mg/dL — ABNORMAL HIGH (ref 70–99)
Glucose, Bld: 161 mg/dL — ABNORMAL HIGH (ref 70–99)
Glucose, Bld: 162 mg/dL — ABNORMAL HIGH (ref 70–99)
Potassium: 3.6 mEq/L (ref 3.5–5.1)
Potassium: 4 mEq/L (ref 3.5–5.1)
Potassium: 4.1 mEq/L (ref 3.5–5.1)
Sodium: 136 mEq/L (ref 135–145)
Sodium: 137 mEq/L (ref 135–145)
Sodium: 141 mEq/L (ref 135–145)

## 2011-01-11 LAB — CBC
HCT: 23 % — ABNORMAL LOW (ref 36.0–46.0)
HCT: 27.2 % — ABNORMAL LOW (ref 36.0–46.0)
HCT: 39 % (ref 36.0–46.0)
Hemoglobin: 12.5 g/dL (ref 12.0–15.0)
Hemoglobin: 7.5 g/dL — CL (ref 12.0–15.0)
Hemoglobin: 8.8 g/dL — ABNORMAL LOW (ref 12.0–15.0)
MCHC: 32 g/dL (ref 30.0–36.0)
MCHC: 32.4 g/dL (ref 30.0–36.0)
MCHC: 32.4 g/dL (ref 30.0–36.0)
MCV: 84.1 fL (ref 78.0–100.0)
MCV: 84.6 fL (ref 78.0–100.0)
MCV: 85.1 fL (ref 78.0–100.0)
Platelets: 222 10*3/uL (ref 150–400)
Platelets: 273 10*3/uL (ref 150–400)
Platelets: 426 10*3/uL — ABNORMAL HIGH (ref 150–400)
RBC: 2.74 MIL/uL — ABNORMAL LOW (ref 3.87–5.11)
RBC: 3.19 MIL/uL — ABNORMAL LOW (ref 3.87–5.11)
RBC: 4.61 MIL/uL (ref 3.87–5.11)
RDW: 14.6 % (ref 11.5–15.5)
RDW: 14.9 % (ref 11.5–15.5)
RDW: 15.2 % (ref 11.5–15.5)
WBC: 14.8 10*3/uL — ABNORMAL HIGH (ref 4.0–10.5)
WBC: 15 10*3/uL — ABNORMAL HIGH (ref 4.0–10.5)
WBC: 15.2 10*3/uL — ABNORMAL HIGH (ref 4.0–10.5)

## 2011-01-11 LAB — URINALYSIS, ROUTINE W REFLEX MICROSCOPIC
Bilirubin Urine: NEGATIVE
Glucose, UA: NEGATIVE mg/dL
Hgb urine dipstick: NEGATIVE
Ketones, ur: NEGATIVE mg/dL
Leukocytes, UA: NEGATIVE
Nitrite: NEGATIVE
Protein, ur: 30 mg/dL — AB
Specific Gravity, Urine: 1.026 (ref 1.005–1.030)
Urobilinogen, UA: 0.2 mg/dL (ref 0.0–1.0)
pH: 5.5 (ref 5.0–8.0)

## 2011-01-11 LAB — DIFFERENTIAL
Basophils Absolute: 0 10*3/uL (ref 0.0–0.1)
Basophils Relative: 0 % (ref 0–1)
Eosinophils Absolute: 0.2 10*3/uL (ref 0.0–0.7)
Eosinophils Relative: 1 % (ref 0–5)
Lymphocytes Relative: 19 % (ref 12–46)
Lymphs Abs: 2.9 10*3/uL (ref 0.7–4.0)
Monocytes Absolute: 0.9 10*3/uL (ref 0.1–1.0)
Monocytes Relative: 6 % (ref 3–12)
Neutro Abs: 11.2 10*3/uL — ABNORMAL HIGH (ref 1.7–7.7)
Neutrophils Relative %: 74 % (ref 43–77)

## 2011-01-11 LAB — PROTIME-INR
INR: 0.89 (ref 0.00–1.49)
INR: 1.2 (ref 0.00–1.49)
INR: 1.22 (ref 0.00–1.49)
Prothrombin Time: 12 seconds (ref 11.6–15.2)
Prothrombin Time: 15.1 seconds (ref 11.6–15.2)
Prothrombin Time: 15.3 seconds — ABNORMAL HIGH (ref 11.6–15.2)

## 2011-01-11 LAB — TYPE AND SCREEN
ABO/RH(D): O POS
Antibody Screen: NEGATIVE

## 2011-01-11 LAB — GLUCOSE, CAPILLARY
Glucose-Capillary: 112 mg/dL — ABNORMAL HIGH (ref 70–99)
Glucose-Capillary: 120 mg/dL — ABNORMAL HIGH (ref 70–99)
Glucose-Capillary: 122 mg/dL — ABNORMAL HIGH (ref 70–99)
Glucose-Capillary: 135 mg/dL — ABNORMAL HIGH (ref 70–99)
Glucose-Capillary: 147 mg/dL — ABNORMAL HIGH (ref 70–99)
Glucose-Capillary: 151 mg/dL — ABNORMAL HIGH (ref 70–99)
Glucose-Capillary: 179 mg/dL — ABNORMAL HIGH (ref 70–99)
Glucose-Capillary: 182 mg/dL — ABNORMAL HIGH (ref 70–99)
Glucose-Capillary: 189 mg/dL — ABNORMAL HIGH (ref 70–99)
Glucose-Capillary: 220 mg/dL — ABNORMAL HIGH (ref 70–99)
Glucose-Capillary: 92 mg/dL (ref 70–99)
Glucose-Capillary: 97 mg/dL (ref 70–99)

## 2011-01-11 LAB — PREPARE RBC (CROSSMATCH)

## 2011-01-11 LAB — APTT: aPTT: 43 seconds — ABNORMAL HIGH (ref 24–37)

## 2011-01-11 LAB — ABO/RH: ABO/RH(D): O POS

## 2011-01-11 LAB — URINE MICROSCOPIC-ADD ON

## 2011-01-18 LAB — CBC
HCT: 19.5 % — ABNORMAL LOW (ref 36.0–46.0)
HCT: 23.6 % — ABNORMAL LOW (ref 36.0–46.0)
HCT: 23.8 % — ABNORMAL LOW (ref 36.0–46.0)
HCT: 23.8 % — ABNORMAL LOW (ref 36.0–46.0)
HCT: 24.4 % — ABNORMAL LOW (ref 36.0–46.0)
HCT: 24.6 % — ABNORMAL LOW (ref 36.0–46.0)
HCT: 25.4 % — ABNORMAL LOW (ref 36.0–46.0)
Hemoglobin: 6.5 g/dL — CL (ref 12.0–15.0)
Hemoglobin: 8 g/dL — ABNORMAL LOW (ref 12.0–15.0)
Hemoglobin: 8.1 g/dL — ABNORMAL LOW (ref 12.0–15.0)
Hemoglobin: 8.1 g/dL — ABNORMAL LOW (ref 12.0–15.0)
Hemoglobin: 8.2 g/dL — ABNORMAL LOW (ref 12.0–15.0)
Hemoglobin: 8.4 g/dL — ABNORMAL LOW (ref 12.0–15.0)
Hemoglobin: 8.6 g/dL — ABNORMAL LOW (ref 12.0–15.0)
MCHC: 33.4 g/dL (ref 30.0–36.0)
MCHC: 33.5 g/dL (ref 30.0–36.0)
MCHC: 33.7 g/dL (ref 30.0–36.0)
MCHC: 33.9 g/dL (ref 30.0–36.0)
MCHC: 34.1 g/dL (ref 30.0–36.0)
MCHC: 34.5 g/dL (ref 30.0–36.0)
MCHC: 34.6 g/dL (ref 30.0–36.0)
MCV: 83.9 fL (ref 78.0–100.0)
MCV: 85.3 fL (ref 78.0–100.0)
MCV: 86.3 fL (ref 78.0–100.0)
MCV: 86.8 fL (ref 78.0–100.0)
MCV: 87.2 fL (ref 78.0–100.0)
MCV: 87.3 fL (ref 78.0–100.0)
MCV: 87.6 fL (ref 78.0–100.0)
Platelets: 416 10*3/uL — ABNORMAL HIGH (ref 150–400)
Platelets: 421 10*3/uL — ABNORMAL HIGH (ref 150–400)
Platelets: 435 10*3/uL — ABNORMAL HIGH (ref 150–400)
Platelets: 495 10*3/uL — ABNORMAL HIGH (ref 150–400)
Platelets: 497 10*3/uL — ABNORMAL HIGH (ref 150–400)
Platelets: 503 10*3/uL — ABNORMAL HIGH (ref 150–400)
Platelets: 507 10*3/uL — ABNORMAL HIGH (ref 150–400)
RBC: 2.32 MIL/uL — ABNORMAL LOW (ref 3.87–5.11)
RBC: 2.73 MIL/uL — ABNORMAL LOW (ref 3.87–5.11)
RBC: 2.75 MIL/uL — ABNORMAL LOW (ref 3.87–5.11)
RBC: 2.76 MIL/uL — ABNORMAL LOW (ref 3.87–5.11)
RBC: 2.81 MIL/uL — ABNORMAL LOW (ref 3.87–5.11)
RBC: 2.83 MIL/uL — ABNORMAL LOW (ref 3.87–5.11)
RBC: 2.91 MIL/uL — ABNORMAL LOW (ref 3.87–5.11)
RDW: 14.6 % (ref 11.5–15.5)
RDW: 14.9 % (ref 11.5–15.5)
RDW: 15.3 % (ref 11.5–15.5)
RDW: 15.4 % (ref 11.5–15.5)
RDW: 15.6 % — ABNORMAL HIGH (ref 11.5–15.5)
RDW: 16.5 % — ABNORMAL HIGH (ref 11.5–15.5)
RDW: 17.6 % — ABNORMAL HIGH (ref 11.5–15.5)
WBC: 10.6 10*3/uL — ABNORMAL HIGH (ref 4.0–10.5)
WBC: 12.9 10*3/uL — ABNORMAL HIGH (ref 4.0–10.5)
WBC: 13.4 10*3/uL — ABNORMAL HIGH (ref 4.0–10.5)
WBC: 14.2 10*3/uL — ABNORMAL HIGH (ref 4.0–10.5)
WBC: 14.5 10*3/uL — ABNORMAL HIGH (ref 4.0–10.5)
WBC: 7.9 10*3/uL (ref 4.0–10.5)
WBC: 8.7 10*3/uL (ref 4.0–10.5)

## 2011-01-18 LAB — RENAL FUNCTION PANEL
Albumin: 1.6 g/dL — ABNORMAL LOW (ref 3.5–5.2)
BUN: 7 mg/dL (ref 6–23)
CO2: 31 mEq/L (ref 19–32)
Calcium: 8.4 mg/dL (ref 8.4–10.5)
Chloride: 103 mEq/L (ref 96–112)
Creatinine, Ser: 1.06 mg/dL (ref 0.4–1.2)
GFR calc Af Amer: >60 mL/min (ref >60)
GFR calc non Af Amer: 53 mL/min — ABNORMAL LOW (ref >60)
Glucose, Bld: 176 mg/dL — ABNORMAL HIGH (ref 70–99)
Phosphorus: 3.7 mg/dL (ref 2.3–4.6)
Potassium: 3.9 mEq/L (ref 3.5–5.1)
Sodium: 142 mEq/L (ref 135–145)

## 2011-01-18 LAB — GLUCOSE, CAPILLARY
Glucose-Capillary: 101 mg/dL — ABNORMAL HIGH (ref 70–99)
Glucose-Capillary: 101 mg/dL — ABNORMAL HIGH (ref 70–99)
Glucose-Capillary: 103 mg/dL — ABNORMAL HIGH (ref 70–99)
Glucose-Capillary: 107 mg/dL — ABNORMAL HIGH (ref 70–99)
Glucose-Capillary: 117 mg/dL — ABNORMAL HIGH (ref 70–99)
Glucose-Capillary: 119 mg/dL — ABNORMAL HIGH (ref 70–99)
Glucose-Capillary: 120 mg/dL — ABNORMAL HIGH (ref 70–99)
Glucose-Capillary: 121 mg/dL — ABNORMAL HIGH (ref 70–99)
Glucose-Capillary: 122 mg/dL — ABNORMAL HIGH (ref 70–99)
Glucose-Capillary: 123 mg/dL — ABNORMAL HIGH (ref 70–99)
Glucose-Capillary: 127 mg/dL — ABNORMAL HIGH (ref 70–99)
Glucose-Capillary: 128 mg/dL — ABNORMAL HIGH (ref 70–99)
Glucose-Capillary: 130 mg/dL — ABNORMAL HIGH (ref 70–99)
Glucose-Capillary: 131 mg/dL — ABNORMAL HIGH (ref 70–99)
Glucose-Capillary: 132 mg/dL — ABNORMAL HIGH (ref 70–99)
Glucose-Capillary: 132 mg/dL — ABNORMAL HIGH (ref 70–99)
Glucose-Capillary: 133 mg/dL — ABNORMAL HIGH (ref 70–99)
Glucose-Capillary: 134 mg/dL — ABNORMAL HIGH (ref 70–99)
Glucose-Capillary: 134 mg/dL — ABNORMAL HIGH (ref 70–99)
Glucose-Capillary: 137 mg/dL — ABNORMAL HIGH (ref 70–99)
Glucose-Capillary: 137 mg/dL — ABNORMAL HIGH (ref 70–99)
Glucose-Capillary: 139 mg/dL — ABNORMAL HIGH (ref 70–99)
Glucose-Capillary: 140 mg/dL — ABNORMAL HIGH (ref 70–99)
Glucose-Capillary: 140 mg/dL — ABNORMAL HIGH (ref 70–99)
Glucose-Capillary: 141 mg/dL — ABNORMAL HIGH (ref 70–99)
Glucose-Capillary: 141 mg/dL — ABNORMAL HIGH (ref 70–99)
Glucose-Capillary: 141 mg/dL — ABNORMAL HIGH (ref 70–99)
Glucose-Capillary: 143 mg/dL — ABNORMAL HIGH (ref 70–99)
Glucose-Capillary: 143 mg/dL — ABNORMAL HIGH (ref 70–99)
Glucose-Capillary: 146 mg/dL — ABNORMAL HIGH (ref 70–99)
Glucose-Capillary: 147 mg/dL — ABNORMAL HIGH (ref 70–99)
Glucose-Capillary: 147 mg/dL — ABNORMAL HIGH (ref 70–99)
Glucose-Capillary: 148 mg/dL — ABNORMAL HIGH (ref 70–99)
Glucose-Capillary: 154 mg/dL — ABNORMAL HIGH (ref 70–99)
Glucose-Capillary: 156 mg/dL — ABNORMAL HIGH (ref 70–99)
Glucose-Capillary: 157 mg/dL — ABNORMAL HIGH (ref 70–99)
Glucose-Capillary: 157 mg/dL — ABNORMAL HIGH (ref 70–99)
Glucose-Capillary: 167 mg/dL — ABNORMAL HIGH (ref 70–99)
Glucose-Capillary: 169 mg/dL — ABNORMAL HIGH (ref 70–99)
Glucose-Capillary: 182 mg/dL — ABNORMAL HIGH (ref 70–99)
Glucose-Capillary: 187 mg/dL — ABNORMAL HIGH (ref 70–99)
Glucose-Capillary: 220 mg/dL — ABNORMAL HIGH (ref 70–99)
Glucose-Capillary: 87 mg/dL (ref 70–99)

## 2011-01-18 LAB — BASIC METABOLIC PANEL
BUN: 10 mg/dL (ref 6–23)
BUN: 6 mg/dL (ref 6–23)
BUN: 7 mg/dL (ref 6–23)
BUN: 7 mg/dL (ref 6–23)
BUN: 8 mg/dL (ref 6–23)
CO2: 28 mEq/L (ref 19–32)
CO2: 29 mEq/L (ref 19–32)
CO2: 31 mEq/L (ref 19–32)
CO2: 32 mEq/L (ref 19–32)
CO2: 33 mEq/L — ABNORMAL HIGH (ref 19–32)
Calcium: 8.1 mg/dL — ABNORMAL LOW (ref 8.4–10.5)
Calcium: 8.1 mg/dL — ABNORMAL LOW (ref 8.4–10.5)
Calcium: 8.2 mg/dL — ABNORMAL LOW (ref 8.4–10.5)
Calcium: 8.3 mg/dL — ABNORMAL LOW (ref 8.4–10.5)
Calcium: 8.5 mg/dL (ref 8.4–10.5)
Chloride: 102 mEq/L (ref 96–112)
Chloride: 102 mEq/L (ref 96–112)
Chloride: 102 mEq/L (ref 96–112)
Chloride: 104 mEq/L (ref 96–112)
Chloride: 98 mEq/L (ref 96–112)
Creatinine, Ser: 0.94 mg/dL (ref 0.4–1.2)
Creatinine, Ser: 0.95 mg/dL (ref 0.4–1.2)
Creatinine, Ser: 0.96 mg/dL (ref 0.4–1.2)
Creatinine, Ser: 1.01 mg/dL (ref 0.4–1.2)
Creatinine, Ser: 1.34 mg/dL — ABNORMAL HIGH (ref 0.4–1.2)
GFR calc Af Amer: 49 mL/min — ABNORMAL LOW (ref >60)
GFR calc Af Amer: >60 mL/min (ref >60)
GFR calc Af Amer: >60 mL/min (ref >60)
GFR calc Af Amer: >60 mL/min (ref >60)
GFR calc Af Amer: >60 mL/min (ref >60)
GFR calc non Af Amer: 41 mL/min — ABNORMAL LOW (ref >60)
GFR calc non Af Amer: 56 mL/min — ABNORMAL LOW (ref >60)
GFR calc non Af Amer: 60 mL/min — ABNORMAL LOW (ref >60)
GFR calc non Af Amer: >60 mL/min (ref >60)
GFR calc non Af Amer: >60 mL/min (ref >60)
Glucose, Bld: 123 mg/dL — ABNORMAL HIGH (ref 70–99)
Glucose, Bld: 123 mg/dL — ABNORMAL HIGH (ref 70–99)
Glucose, Bld: 126 mg/dL — ABNORMAL HIGH (ref 70–99)
Glucose, Bld: 137 mg/dL — ABNORMAL HIGH (ref 70–99)
Glucose, Bld: 157 mg/dL — ABNORMAL HIGH (ref 70–99)
Potassium: 3.2 mEq/L — ABNORMAL LOW (ref 3.5–5.1)
Potassium: 3.3 mEq/L — ABNORMAL LOW (ref 3.5–5.1)
Potassium: 3.5 mEq/L (ref 3.5–5.1)
Potassium: 3.6 mEq/L (ref 3.5–5.1)
Potassium: 4.1 mEq/L (ref 3.5–5.1)
Sodium: 137 mEq/L (ref 135–145)
Sodium: 137 mEq/L (ref 135–145)
Sodium: 140 mEq/L (ref 135–145)
Sodium: 141 mEq/L (ref 135–145)
Sodium: 141 mEq/L (ref 135–145)

## 2011-01-18 LAB — TSH: TSH: 2.603 u[IU]/mL (ref 0.350–4.500)

## 2011-01-18 LAB — HEMOCCULT GUIAC POC 1CARD (OFFICE)
Fecal Occult Bld: NEGATIVE
Fecal Occult Bld: NEGATIVE
Fecal Occult Bld: NEGATIVE

## 2011-01-18 LAB — CROSSMATCH
ABO/RH(D): O POS
Antibody Screen: NEGATIVE

## 2011-01-18 LAB — DIFFERENTIAL
Basophils Absolute: 0 10*3/uL (ref 0.0–0.1)
Basophils Relative: 1 % (ref 0–1)
Eosinophils Absolute: 0.3 10*3/uL (ref 0.0–0.7)
Eosinophils Relative: 3 % (ref 0–5)
Lymphocytes Relative: 20 % (ref 12–46)
Lymphs Abs: 1.7 10*3/uL (ref 0.7–4.0)
Monocytes Absolute: 0.8 10*3/uL (ref 0.1–1.0)
Monocytes Relative: 9 % (ref 3–12)
Neutro Abs: 5.9 10*3/uL (ref 1.7–7.7)
Neutrophils Relative %: 68 % (ref 43–77)

## 2011-01-18 LAB — VANCOMYCIN, TROUGH
Vancomycin Tr: 14.3 ug/mL (ref 10.0–20.0)
Vancomycin Tr: 30.5 ug/mL (ref 10.0–20.0)

## 2011-01-23 LAB — BASIC METABOLIC PANEL
BUN: 14 mg/dL (ref 6–23)
BUN: 18 mg/dL (ref 6–23)
BUN: 18 mg/dL (ref 6–23)
BUN: 23 mg/dL (ref 6–23)
BUN: 26 mg/dL — ABNORMAL HIGH (ref 6–23)
BUN: 29 mg/dL — ABNORMAL HIGH (ref 6–23)
BUN: 43 mg/dL — ABNORMAL HIGH (ref 6–23)
CO2: 23 mEq/L (ref 19–32)
CO2: 23 mEq/L (ref 19–32)
CO2: 24 mEq/L (ref 19–32)
CO2: 25 mEq/L (ref 19–32)
CO2: 25 mEq/L (ref 19–32)
CO2: 27 mEq/L (ref 19–32)
CO2: 28 mEq/L (ref 19–32)
Calcium: 8 mg/dL — ABNORMAL LOW (ref 8.4–10.5)
Calcium: 8.1 mg/dL — ABNORMAL LOW (ref 8.4–10.5)
Calcium: 8.1 mg/dL — ABNORMAL LOW (ref 8.4–10.5)
Calcium: 8.1 mg/dL — ABNORMAL LOW (ref 8.4–10.5)
Calcium: 8.7 mg/dL (ref 8.4–10.5)
Calcium: 8.8 mg/dL (ref 8.4–10.5)
Calcium: 9.1 mg/dL (ref 8.4–10.5)
Chloride: 103 mEq/L (ref 96–112)
Chloride: 104 mEq/L (ref 96–112)
Chloride: 109 mEq/L (ref 96–112)
Chloride: 110 mEq/L (ref 96–112)
Chloride: 111 mEq/L (ref 96–112)
Chloride: 111 mEq/L (ref 96–112)
Chloride: 98 mEq/L (ref 96–112)
Creatinine, Ser: 0.91 mg/dL (ref 0.4–1.2)
Creatinine, Ser: 1.08 mg/dL (ref 0.4–1.2)
Creatinine, Ser: 1.09 mg/dL (ref 0.4–1.2)
Creatinine, Ser: 1.1 mg/dL (ref 0.4–1.2)
Creatinine, Ser: 1.15 mg/dL (ref 0.4–1.2)
Creatinine, Ser: 1.19 mg/dL (ref 0.4–1.2)
Creatinine, Ser: 1.62 mg/dL — ABNORMAL HIGH (ref 0.4–1.2)
GFR calc Af Amer: 39 mL/min — ABNORMAL LOW (ref >60)
GFR calc Af Amer: 56 mL/min — ABNORMAL LOW (ref >60)
GFR calc Af Amer: 59 mL/min — ABNORMAL LOW (ref >60)
GFR calc Af Amer: >60 mL/min (ref >60)
GFR calc Af Amer: >60 mL/min (ref >60)
GFR calc Af Amer: >60 mL/min (ref >60)
GFR calc Af Amer: >60 mL/min (ref >60)
GFR calc non Af Amer: 33 mL/min — ABNORMAL LOW (ref >60)
GFR calc non Af Amer: 47 mL/min — ABNORMAL LOW (ref >60)
GFR calc non Af Amer: 48 mL/min — ABNORMAL LOW (ref >60)
GFR calc non Af Amer: 51 mL/min — ABNORMAL LOW (ref >60)
GFR calc non Af Amer: 52 mL/min — ABNORMAL LOW (ref >60)
GFR calc non Af Amer: 52 mL/min — ABNORMAL LOW (ref >60)
GFR calc non Af Amer: >60 mL/min (ref >60)
Glucose, Bld: 126 mg/dL — ABNORMAL HIGH (ref 70–99)
Glucose, Bld: 137 mg/dL — ABNORMAL HIGH (ref 70–99)
Glucose, Bld: 148 mg/dL — ABNORMAL HIGH (ref 70–99)
Glucose, Bld: 190 mg/dL — ABNORMAL HIGH (ref 70–99)
Glucose, Bld: 276 mg/dL — ABNORMAL HIGH (ref 70–99)
Glucose, Bld: 394 mg/dL — ABNORMAL HIGH (ref 70–99)
Glucose, Bld: 77 mg/dL (ref 70–99)
Potassium: 2.5 mEq/L — CL (ref 3.5–5.1)
Potassium: 2.5 mEq/L — CL (ref 3.5–5.1)
Potassium: 2.7 mEq/L — CL (ref 3.5–5.1)
Potassium: 3.2 mEq/L — ABNORMAL LOW (ref 3.5–5.1)
Potassium: 3.5 mEq/L (ref 3.5–5.1)
Potassium: 3.6 mEq/L (ref 3.5–5.1)
Potassium: 3.8 mEq/L (ref 3.5–5.1)
Sodium: 135 mEq/L (ref 135–145)
Sodium: 137 mEq/L (ref 135–145)
Sodium: 139 mEq/L (ref 135–145)
Sodium: 142 mEq/L (ref 135–145)
Sodium: 142 mEq/L (ref 135–145)
Sodium: 143 mEq/L (ref 135–145)
Sodium: 143 mEq/L (ref 135–145)

## 2011-01-23 LAB — GRAM STAIN

## 2011-01-23 LAB — CBC
HCT: 21.3 % — ABNORMAL LOW (ref 36.0–46.0)
HCT: 21.5 % — ABNORMAL LOW (ref 36.0–46.0)
HCT: 21.8 % — ABNORMAL LOW (ref 36.0–46.0)
HCT: 25 % — ABNORMAL LOW (ref 36.0–46.0)
HCT: 31.8 % — ABNORMAL LOW (ref 36.0–46.0)
HCT: 32.2 % — ABNORMAL LOW (ref 36.0–46.0)
Hemoglobin: 10.9 g/dL — ABNORMAL LOW (ref 12.0–15.0)
Hemoglobin: 11.1 g/dL — ABNORMAL LOW (ref 12.0–15.0)
Hemoglobin: 7.1 g/dL — CL (ref 12.0–15.0)
Hemoglobin: 7.1 g/dL — CL (ref 12.0–15.0)
Hemoglobin: 7.5 g/dL — CL (ref 12.0–15.0)
Hemoglobin: 8.4 g/dL — ABNORMAL LOW (ref 12.0–15.0)
MCHC: 32.9 g/dL (ref 30.0–36.0)
MCHC: 33.5 g/dL (ref 30.0–36.0)
MCHC: 33.6 g/dL (ref 30.0–36.0)
MCHC: 34.1 g/dL (ref 30.0–36.0)
MCHC: 34.3 g/dL (ref 30.0–36.0)
MCHC: 34.3 g/dL (ref 30.0–36.0)
MCV: 82.3 fL (ref 78.0–100.0)
MCV: 82.5 fL (ref 78.0–100.0)
MCV: 83.2 fL (ref 78.0–100.0)
MCV: 83.5 fL (ref 78.0–100.0)
MCV: 84.3 fL (ref 78.0–100.0)
MCV: 84.5 fL (ref 78.0–100.0)
Platelets: 303 10*3/uL (ref 150–400)
Platelets: 315 10*3/uL (ref 150–400)
Platelets: 339 10*3/uL (ref 150–400)
Platelets: 369 10*3/uL (ref 150–400)
Platelets: 372 10*3/uL (ref 150–400)
Platelets: 373 10*3/uL (ref 150–400)
RBC: 2.55 MIL/uL — ABNORMAL LOW (ref 3.87–5.11)
RBC: 2.55 MIL/uL — ABNORMAL LOW (ref 3.87–5.11)
RBC: 2.65 MIL/uL — ABNORMAL LOW (ref 3.87–5.11)
RBC: 2.96 MIL/uL — ABNORMAL LOW (ref 3.87–5.11)
RBC: 3.82 MIL/uL — ABNORMAL LOW (ref 3.87–5.11)
RBC: 3.9 MIL/uL (ref 3.87–5.11)
RDW: 14.1 % (ref 11.5–15.5)
RDW: 14.3 % (ref 11.5–15.5)
RDW: 14.3 % (ref 11.5–15.5)
RDW: 14.5 % (ref 11.5–15.5)
RDW: 14.9 % (ref 11.5–15.5)
RDW: 15 % (ref 11.5–15.5)
WBC: 21 10*3/uL — ABNORMAL HIGH (ref 4.0–10.5)
WBC: 21.5 10*3/uL — ABNORMAL HIGH (ref 4.0–10.5)
WBC: 23.7 10*3/uL — ABNORMAL HIGH (ref 4.0–10.5)
WBC: 24.5 10*3/uL — ABNORMAL HIGH (ref 4.0–10.5)
WBC: 30 10*3/uL — ABNORMAL HIGH (ref 4.0–10.5)
WBC: 33.7 10*3/uL — ABNORMAL HIGH (ref 4.0–10.5)

## 2011-01-23 LAB — URINE MICROSCOPIC-ADD ON

## 2011-01-23 LAB — GLUCOSE, CAPILLARY
Glucose-Capillary: 103 mg/dL — ABNORMAL HIGH (ref 70–99)
Glucose-Capillary: 110 mg/dL — ABNORMAL HIGH (ref 70–99)
Glucose-Capillary: 111 mg/dL — ABNORMAL HIGH (ref 70–99)
Glucose-Capillary: 113 mg/dL — ABNORMAL HIGH (ref 70–99)
Glucose-Capillary: 117 mg/dL — ABNORMAL HIGH (ref 70–99)
Glucose-Capillary: 126 mg/dL — ABNORMAL HIGH (ref 70–99)
Glucose-Capillary: 133 mg/dL — ABNORMAL HIGH (ref 70–99)
Glucose-Capillary: 133 mg/dL — ABNORMAL HIGH (ref 70–99)
Glucose-Capillary: 137 mg/dL — ABNORMAL HIGH (ref 70–99)
Glucose-Capillary: 138 mg/dL — ABNORMAL HIGH (ref 70–99)
Glucose-Capillary: 139 mg/dL — ABNORMAL HIGH (ref 70–99)
Glucose-Capillary: 143 mg/dL — ABNORMAL HIGH (ref 70–99)
Glucose-Capillary: 144 mg/dL — ABNORMAL HIGH (ref 70–99)
Glucose-Capillary: 145 mg/dL — ABNORMAL HIGH (ref 70–99)
Glucose-Capillary: 148 mg/dL — ABNORMAL HIGH (ref 70–99)
Glucose-Capillary: 162 mg/dL — ABNORMAL HIGH (ref 70–99)
Glucose-Capillary: 162 mg/dL — ABNORMAL HIGH (ref 70–99)
Glucose-Capillary: 168 mg/dL — ABNORMAL HIGH (ref 70–99)
Glucose-Capillary: 172 mg/dL — ABNORMAL HIGH (ref 70–99)
Glucose-Capillary: 181 mg/dL — ABNORMAL HIGH (ref 70–99)
Glucose-Capillary: 199 mg/dL — ABNORMAL HIGH (ref 70–99)
Glucose-Capillary: 199 mg/dL — ABNORMAL HIGH (ref 70–99)
Glucose-Capillary: 219 mg/dL — ABNORMAL HIGH (ref 70–99)
Glucose-Capillary: 222 mg/dL — ABNORMAL HIGH (ref 70–99)
Glucose-Capillary: 236 mg/dL — ABNORMAL HIGH (ref 70–99)
Glucose-Capillary: 300 mg/dL — ABNORMAL HIGH (ref 70–99)
Glucose-Capillary: 304 mg/dL — ABNORMAL HIGH (ref 70–99)
Glucose-Capillary: 350 mg/dL — ABNORMAL HIGH (ref 70–99)
Glucose-Capillary: 64 mg/dL — ABNORMAL LOW (ref 70–99)
Glucose-Capillary: 65 mg/dL — ABNORMAL LOW (ref 70–99)
Glucose-Capillary: 69 mg/dL — ABNORMAL LOW (ref 70–99)
Glucose-Capillary: 70 mg/dL (ref 70–99)
Glucose-Capillary: 70 mg/dL (ref 70–99)
Glucose-Capillary: 76 mg/dL (ref 70–99)
Glucose-Capillary: 81 mg/dL (ref 70–99)
Glucose-Capillary: 89 mg/dL (ref 70–99)
Glucose-Capillary: 90 mg/dL (ref 70–99)
Glucose-Capillary: 92 mg/dL (ref 70–99)

## 2011-01-23 LAB — DIFFERENTIAL
Basophils Absolute: 0 10*3/uL (ref 0.0–0.1)
Basophils Absolute: 0 10*3/uL (ref 0.0–0.1)
Basophils Relative: 0 % (ref 0–1)
Basophils Relative: 0 % (ref 0–1)
Eosinophils Absolute: 0 10*3/uL (ref 0.0–0.7)
Eosinophils Absolute: 0.2 10*3/uL (ref 0.0–0.7)
Eosinophils Relative: 0 % (ref 0–5)
Eosinophils Relative: 1 % (ref 0–5)
Lymphocytes Relative: 11 % — ABNORMAL LOW (ref 12–46)
Lymphocytes Relative: 3 % — ABNORMAL LOW (ref 12–46)
Lymphs Abs: 1 10*3/uL (ref 0.7–4.0)
Lymphs Abs: 2.6 10*3/uL (ref 0.7–4.0)
Monocytes Absolute: 0.7 10*3/uL (ref 0.1–1.0)
Monocytes Absolute: 1 10*3/uL (ref 0.1–1.0)
Monocytes Relative: 3 % (ref 3–12)
Monocytes Relative: 3 % (ref 3–12)
Neutro Abs: 20.2 10*3/uL — ABNORMAL HIGH (ref 1.7–7.7)
Neutro Abs: 31.7 10*3/uL — ABNORMAL HIGH (ref 1.7–7.7)
Neutrophils Relative %: 85 % — ABNORMAL HIGH (ref 43–77)
Neutrophils Relative %: 94 % — ABNORMAL HIGH (ref 43–77)

## 2011-01-23 LAB — TYPE AND SCREEN
ABO/RH(D): O POS
Antibody Screen: NEGATIVE

## 2011-01-23 LAB — FUNGUS CULTURE W SMEAR: Fungal Smear: NONE SEEN

## 2011-01-23 LAB — URINALYSIS, ROUTINE W REFLEX MICROSCOPIC
Bilirubin Urine: NEGATIVE
Glucose, UA: 100 mg/dL — AB
Hgb urine dipstick: NEGATIVE
Ketones, ur: NEGATIVE mg/dL
Leukocytes, UA: NEGATIVE
Nitrite: NEGATIVE
Protein, ur: 30 mg/dL — AB
Specific Gravity, Urine: 1.021 (ref 1.005–1.030)
Urobilinogen, UA: 0.2 mg/dL (ref 0.0–1.0)
pH: 6 (ref 5.0–8.0)

## 2011-01-23 LAB — CULTURE, ROUTINE-ABSCESS

## 2011-01-23 LAB — ANAEROBIC CULTURE

## 2011-01-23 LAB — POCT I-STAT 4, (NA,K, GLUC, HGB,HCT)
Glucose, Bld: 109 mg/dL — ABNORMAL HIGH (ref 70–99)
HCT: 34 % — ABNORMAL LOW (ref 36.0–46.0)
Hemoglobin: 11.6 g/dL — ABNORMAL LOW (ref 12.0–15.0)
Potassium: 2.6 mEq/L — CL (ref 3.5–5.1)
Sodium: 145 mEq/L (ref 135–145)

## 2011-01-23 LAB — CULTURE, BLOOD (ROUTINE X 2)
Culture: NO GROWTH
Culture: NO GROWTH

## 2011-01-23 LAB — URINE CULTURE
Colony Count: NO GROWTH
Culture: NO GROWTH

## 2011-01-23 LAB — LIPID PANEL
Cholesterol: 151 mg/dL (ref 0–200)
HDL: <10 mg/dL — ABNORMAL LOW (ref >39)
Triglycerides: 245 mg/dL — ABNORMAL HIGH (ref <150)
VLDL: 49 mg/dL — ABNORMAL HIGH (ref 0–40)

## 2011-01-23 LAB — PROTIME-INR
INR: 1 (ref 0.00–1.49)
Prothrombin Time: 13.9 seconds (ref 11.6–15.2)

## 2011-01-23 LAB — PREALBUMIN
Prealbumin: 10.7 mg/dL — ABNORMAL LOW (ref 18.0–45.0)
Prealbumin: 8.8 mg/dL — ABNORMAL LOW (ref 18.0–45.0)

## 2011-01-23 LAB — PREPARE RBC (CROSSMATCH)

## 2011-01-23 LAB — HEPATIC FUNCTION PANEL
ALT: 14 U/L (ref 0–35)
AST: 11 U/L (ref 0–37)
Albumin: 2.1 g/dL — ABNORMAL LOW (ref 3.5–5.2)
Alkaline Phosphatase: 144 U/L — ABNORMAL HIGH (ref 39–117)
Bilirubin, Direct: <0.1 mg/dL (ref 0.0–0.3)
Total Bilirubin: 0.5 mg/dL (ref 0.3–1.2)
Total Protein: 6.6 g/dL (ref 6.0–8.3)

## 2011-01-23 LAB — POTASSIUM
Potassium: 3.1 mEq/L — ABNORMAL LOW (ref 3.5–5.1)
Potassium: 3.2 mEq/L — ABNORMAL LOW (ref 3.5–5.1)

## 2011-01-23 LAB — HEMOGLOBIN AND HEMATOCRIT, BLOOD
HCT: 23.1 % — ABNORMAL LOW (ref 36.0–46.0)
HCT: 25.3 % — ABNORMAL LOW (ref 36.0–46.0)
HCT: 30.9 % — ABNORMAL LOW (ref 36.0–46.0)
Hemoglobin: 10.4 g/dL — ABNORMAL LOW (ref 12.0–15.0)
Hemoglobin: 7.8 g/dL — CL (ref 12.0–15.0)
Hemoglobin: 8.7 g/dL — ABNORMAL LOW (ref 12.0–15.0)

## 2011-01-23 LAB — URIC ACID: Uric Acid, Serum: 10 mg/dL — ABNORMAL HIGH (ref 2.4–7.0)

## 2011-01-23 LAB — ABO/RH: ABO/RH(D): O POS

## 2011-01-23 LAB — VANCOMYCIN, TROUGH: Vancomycin Tr: 16.9 ug/mL (ref 10.0–20.0)

## 2011-01-23 LAB — MAGNESIUM: Magnesium: 1.6 mg/dL (ref 1.5–2.5)

## 2011-02-20 NOTE — Op Note (Signed)
NAMEKIMIYA, MATOTT             ACCOUNT NO.:  [id number]   MEDICAL RECORD NO.:  [id number]          PATIENT TYPE:  INP   LOCATION:  2550                         FACILITY:  [facility]   PHYSICIAN:  Edsel Petrin. Dalbert Batman, M.D.DATE OF BIRTH:  [birth year]   DATE OF PROCEDURE:  11/29/2008  DATE OF DISCHARGE:                               OPERATIVE REPORT   PREOPERATIVE DIAGNOSIS:  Complex soft tissue infection of the left upper  thigh.   POSTOPERATIVE DIAGNOSIS:  Complex soft tissue infection of the left  upper thigh, left vulva, and left inguinal area.   OPERATION PERFORMED:  Debridement of skin, subcutaneous tissue, and  muscle fascia of left upper thigh, left vulva, and left inguinal area.   SURGEON:  Edsel Petrin. Dalbert Batman, MD   OPERATIVE INDICATIONS:  This is a 60 year old morbidly obese black  female who has a past history of soft tissue infection of the lower  abdominal wall about 10 years ago, managed at [facility].  She has hypertension, morbid obesity, diabetes mellitus, hypothyroidism,  and gastroesophageal reflux disease.  She has had complained of swelling  and pain in the left upper inner thigh for several days and was seen by  Dr. [name] and Dr. Vincente Liberty and found to have cellulitis and  abscess.  Apparently, the emergency room doctors did a needle aspiration  and sent a culture and this has polymicrobial species on Gram-stain.  We  were asked to see the patient late this morning about 11:00 a.m. at  which time I felt that the patient had a fairly significant complex soft  tissue infection in the left upper thigh and needed immediate  debridement.  Her potassium was still 2.6 and we gave several runs of  potassium and got her to the operating room about 4 hours later for  surgery.   OPERATIVE FINDINGS:  The patient had extensive foul-smelling purulent  fluid and soft tissue in the left upper thigh, left vulvar area, and  left inguinal area.  This had a  grayish dark color to it and a very foul  odor.  Aerobic, anaerobic, and fungal cultures were taken.  We had to  debride skin and subcutaneous tissue and muscle fascia.  The deep muscle  compartments actually felt soft.  At the end of the case, although we  had left a very large wound at least 30 cm long by about 20 cm wide, we  felt that we had debrided all the devitalized tissue.   OPERATIVE TECHNIQUE:  Following the induction of general endotracheal  anesthesia, the patient was placed in rigid stirrups in a modified  dorsolithotomy position.  The lower abdominal wall, thigh, and genitalia  were prepped and draped in sterile fashion.  I made an incision in the  abscess and drained approximately 300 mL of very foul smelling pus.  This was cultured aerobically, aerobically, and for fungus and a stat  Gram-stain was requested.   I then had to spend about 1 hour debriding the skin, subcutaneous  tissue, and fascia because the infection extended well up into the left  inguinal canal, then down and  medial to the vulva and down into the  posterior medial thigh.  I debrided the skin, subcutaneous tissue, and  fascia.  Hemostasis was good and achieved with electrocautery.  After I  felt that all of the tissue and all of the infection had been  controlled, I used a pulsatile irrigator to clean up all of the debris  and then did pulse lavage for about 10-15 minutes.  I did a little more  hemostasis with electrocautery and then checked around and found no more  foul-smelling tissue.  No more dark or purulent tissue and hemostasis  was good.  We packed all of the wounds open with saline moistened Kerlix  and put extensive dry bandages on top and used  fishnet panties to hold this in place.  The patient tolerated the  procedure well and was taken to recovery room in stable condition.  Estimated blood loss was about 200 mL.  Complications none.  Sponge,  needle, and instrument counts were  correct.      Edsel Petrin. Dalbert Batman, M.D.  Electronically Signed     HMI/MEDQ  D:  11/29/2008  T:  11/30/2008  Job:  [id number]   cc:   [name], M.D.

## 2011-02-20 NOTE — Assessment & Plan Note (Signed)
OFFICE VISIT   [name] [name], [name] [name]  DOB:  11/05/1950                                       08/10/2010  [id number]   The patient returns for routine followup for peripheral arterial disease  primarily in her right leg.  She was last seen in June of 2011.  At that  time she was having short distance claudication and was put on Pletal.  She states that her walking distance had actually improved somewhat.  However, she developed cellulitis and was only recently discharged from  [facility] regarding this.  She was in the hospital for  approximately 2 weeks.  She states that most of her symptoms have now  resolved from this event and she is now off antibiotics.  However, she  states she is not walking very much because she had been off of her feet  for several weeks.  She currently is able to walk about 20 yards and  then her right foot begins to hurt.  She was walking 1 block before her  hospitalization.   REVIEW OF SYSTEMS:  Today she denies any shortness of breath or chest  pain.   PHYSICAL EXAM:  Blood pressure is 164/88 in the left arm, heart rate 71  and regular, respirations 18.  Lower extremities, the left foot is pink  and warm.  Her right leg and calf still have some mild erythema and  warmth.  The right foot seems well-perfused.  She has no open  ulcerations on the foot.  She has 2+ femoral pulses bilaterally with  absent popliteal and pedal pulses.   Her ABIs today were 0.87 on the left, 0.46 on the right.  This is  similar to her previous ABIs at her last visit.  Monophasic Doppler flow  was noted on the right side with biphasic Doppler flow on the left side.   The patient has marginal perfusion to her right lower extremity.  However, it is difficult to assess what her current symptomatology is  due to her recent cellulitis.  I believe she needs several weeks to  completely recover from this and then needs further evaluation to see  how her symptoms are after the cellulitis has completely resolved.  At  that point we will determine whether or not she needs any intervention  for her right lower extremity circulation.  She is not a very good open  revascularization candidate due to her severe obesity.  If she needs an  intervention hopefully we will be able to do this percutaneously.  I  will see her again in six weeks' time or sooner if her symptoms progress  before then.     Jessy Oto. Fields, MD  Electronically Signed   CEF/MEDQ  D:  08/10/2010  T:  08/11/2010  Job:  [id number]

## 2011-02-20 NOTE — Consult Note (Signed)
[name] [name], [name] [name]             ACCOUNT NO.:  [id number]   MEDICAL RECORD NO.:  [id number]          PATIENT TYPE:  INP   LOCATION:  2303                         FACILITY:  [place]   PHYSICIAN:  Edsel Petrin. Dalbert Batman, M.D.DATE OF BIRTH:  12/29/1950   DATE OF CONSULTATION:  11/29/2008  DATE OF DISCHARGE:                                 CONSULTATION   REQUESTING PHYSICIAN:  [name], MD   REASON FOR CONSULTATION:  Complex left thigh abscess.   HISTORY OF PRESENT ILLNESS:  Ms. Weingart is a 60 year old female patient  with history of diabetes and hypertension, prior history of MRSA  postoperative wound infection in the lower abdominal area.  She was  admitted by Internal Medicine early this morning because of increasing  thigh pain and was found to have a very large and complex thigh abscess.  Her initial white count was 33,000.  She was subsequently started on IV  vancomycin and Unasyn and her white count is only decreased slightly to  30,000 by today.  The patient reports she has never had an abscess like  this before, but did have the postoperative wound infection that was  positive for MRSA at another facility.  A surgical evaluation has been  requested for I&D of this area.   REVIEW OF SYSTEMS:  SKIN:  The patient reports onset of symptoms about 1  week ago in the upper thigh more medial and subsequently increased  largely and significantly over the past several days, especially for the  past 2 days she has had increasing pain.  No significant drainage.  She  is aware of increasing redness and fevers at home.  Pain radiates to the  rectum and into the labia.  ENDOCRINE:  Glucometers have been greater  than 200-250 at home.  She reports that since admission these have  improved.  Otherwise, other review of system categories are negative or  noncontributory.   SOCIAL HISTORY:  Positive for tobacco about 5 cigarettes per day.  Social alcohol only, married.   FAMILY MEDICAL  HISTORY:  Noncontributory.   PAST MEDICAL HISTORY:  1. Diabetes.  2. Obesity.  3. Hypertension.  4. Hypothyroidism.  5. GERD.  6. Abnormal lipids.   PAST SURGICAL HISTORY:  Ventral hernia repair with mesh, open  cholecystectomy, prior hysterectomy with subsequent postoperative MRSA  wound infection.   ALLERGIES:  NKDA.   HOME MEDICATIONS:  Gemfibrozil, indomethacin, doxycycline,  levothyroxine, Lasix, Azor, hydrochlorothiazide, metoprolol extended  release, Nexium, NovoLog pen b.i.d., and hydrocodone.  She also takes  ibuprofen 800 mg p.r.n.   PHYSICAL EXAMINATION:  GENERAL:  A pleasant but anxious female  complaining of severe left thigh pain.  Pain is contributing to the  patient's anxiety level.  VITAL SIGNS:  Temperature 98, BP 157/73, pulse 63 and regular, and  respirations 18.  PSYCH:  The patient is alert and oriented x3.  Affect is appropriate to  current situation.  NEURO:  Cranial nerves II-XII are grossly intact.  She is moving all  extremities x4 without focal neurological deficits.  EYES:  Sclerae are mildly injected, nonicteric.  EARS, NOSE, AND THROAT:  Ears are symmetrical.  No otorrhea.  Nose is  midline.  No rhinorrhea.  Oral mucous membranes are pink and dry.  CHEST:  Bilateral lung sounds are clear to auscultation.  Respiratory  effort is nonlabored.  She is currently on room air satting 97%.  CARDIOVASCULAR:  Heart sounds S1 and S2 without obvious rubs, murmurs,  or gallops.  No JVD.  No peripheral edema except for edema localized to  the thigh abscess to be discussed later in the skin infection.  Pulses  non-tachycardiac.  ABDOMEN:  Obese but soft.  Bowel sounds are present.  Abdomen itself is  nontender.  There are surgical scars as noted.  EXTREMITIES:  Symmetrical in appearance without cyanosis or clubbing.  SKIN:  There is a massively indurated area with probable fluctuant in  the left medial thigh, difficulty to evaluate because of the severe  pain  with even the lightest of touch.  This area extends up to the crease of  the thigh at the hip level involving the medial up to the most lateral  aspect of the thigh towards the hip.  There is significant erythema and  several areas of necrotic superficial skin tissue and significant odor.  There is an area in the upper thigh at the crease of the groin with  yellow purulent fluid under blister-like fluid collections.  On deeper  inspection of the perirectal and labia area, there is no involvement of  the perirectal area, although there is some tenderness with palpation  there.  There is no induration or fluctuance there.  The labia itself  are also not involved, but there are some smaller nodules on the labia  that could be either older abscesses or folliculitis.  She also has  cellulitic skin changes extending up the left groin crease towards the  thigh area and she has an old scar underneath her abdominal pannus which  is a small pannus based on her obesity.  The scar is not involved with  any hernia or infection.  Again, this area is exquisitely tender.   LABORATORY DATA:  White count 30,000 today, was 33,700 on admission;  hemoglobin 10.9; hematocrit 31.8; and platelets 372,000.  Neutrophils on  admission were 94%.  Sodium 142; potassium 2.5; chloride 109; CO2 of 25;  BUN 29; creatinine 1.19, this is down from 1.62; glucose is down from  394, now is 190.  PT is 13.9 and INR is 1.0.  LFTs are normal except for  mild elevation of alkaline phosphatase at 144, HDL is less than 10, LDL  was not calculated, triglycerides were 245.  Urinalysis was negative.  Uric acid 10.   DIAGNOSTICS:  She has an EKG that has been done, which shows sinus.  Brady, sinus arrhythmia, some low voltage criteria for LVH, but could be  normal variant.  She does have T-wave inversion in leads II, III, and  lateral leads V4, V5, and V6 with prolonged QT.   IMPRESSION:  1. Complex left thigh abscess with  cellulitis.  2. Superficial tissue necrosis, probable deeper necrotizing process.  3. Hypokalemia, profound.  4. Prior history of methicillin-resistant Staphylococcus aureus,      postop wound infection, Pfannenstiel incision.  5. Diabetes, uncontrolled at admission.  6. Hypertension, moderately controlled, influenced by increased pain.  7. Low level of regular tobacco usage.   PLAN:  1. Continue Unasyn and vancomycin.  Follow up on would cultures.  2. Operative intervention today.  The patient did eat at 8 o'clock  this morning, so need to await final determination per Anesthesia.  3. Add PCA Dilaudid for improved pain management.  4. N.p.o. for planned surgical procedure later today.  Start IV fluids      at 125 an hour.  Suspect the patient may be somewhat volume      depleted given recent hyperglycemia which has probably been ongoing      at home based on the patient's description.  5. Okay oral replete K, initial doses were x3.  We will go ahead and      at least give 1 dose since she will be n.p.o. and give the ordered      potassium runs x5.  Also start a second line for her maintenance      fluids with normal saline with 30 of potassium per liter, 125 an      hour as previously mentioned.      [place] Lissa Merlin, N.P.      Edsel Petrin. Dalbert Batman, M.D.  Electronically Signed    ALE/MEDQ  D:  11/29/2008  T:  11/29/2008  Job:  [id number]

## 2011-02-20 NOTE — Op Note (Signed)
NAMEKEVEN, SHIBLEY             ACCOUNT NO.:  [id number]   MEDICAL RECORD NO.:  [id number]          PATIENT TYPE:  INP   LOCATION:  2303                         FACILITY:  [place] [facility]   PHYSICIAN:  Edsel Petrin. Dalbert Batman, M.D.DATE OF BIRTH:  08-29-1951   DATE OF PROCEDURE:  11/30/2008  DATE OF DISCHARGE:                               OPERATIVE REPORT   PREOPERATIVE DIAGNOSIS:  Necrotizing fasciitis of left thigh and left  groin.   POSTOPERATIVE DIAGNOSIS:  Necrotizing fasciitis of left thigh and left  groin.   OPERATIONS PERFORMED:  1. Planned return to operating room for dressing change.  2. Debridement of skin, subcutaneous tissue, and muscle fascia of left      thigh and left groin.   SURGEON:  Edsel Petrin. Dalbert Batman, MD   ASSISTANT:  Henreitta Cea, PA   OPERATIVE INDICATIONS:  This is a 60 year old black female who has  insulin-dependent diabetes, morbid obesity, and hypertension.  She was  admitted yesterday with a large abscess in the left thigh proximally.  I  took her to the operating room and under general anesthesia, opened this  up and found that she had necrotizing fasciitis involving the proximal  left thigh extending into the vulva and down into the medial posterior  thigh as well as up into the left inguinal area.  I debrided this  aggressively and packed the wound open.  She has been stable.  She has  returned to the operating room as a planned event for dressing change  and debridement as necessary.   OPERATIVE FINDINGS:  The patient had some devitalized subcutaneous  tissue and fascia.  There was no significant odor at this time.  I could  not tell whether the tissue that I had to debride which was muscle  fascia and subcutaneous tissue and skin was grossly infected or simply  devitalized.  Nevertheless, I debrided it back to healthy bleeding  tissue.  These areas were in the most posterior area as well as the most  lateral in the inguinal area.  At the completion of  the case, I felt  like all of the infection was under control and that we could manage the  wound at the bedside from here on.   OPERATIVE TECHNIQUE:  Following the induction of general endotracheal  anesthesia, the patient was placed in rigid stirrups in a modified  dorsal lithotomy position.  We removed all the packing and inspected the  wound with findings as described above.  We prepped and draped widely  the abdomen, perineum, genitalia, thigh, abdominal wall, and posterior  thigh.  We opened up the wound and debrided some skin and subcutaneous  tissue and muscle fascia at the most lateral part of the inguinal area  as well as the most posterior part of the thigh wound.  This was  relatively conservative.  Hemostasis was excellent and achieved with  electrocautery.  We actually had good bleeding.  The skin actually  looked pretty healthy at the edges and the muscles felt soft, and I felt  we had good control of this.  We used a pulsatile irrigator  for  pulsatile lavage for about 10 or 12 minutes to clean up the wound.  After checking for hemostasis which was good, we packed the wound with  saline-moistened Kerlix and dry cover bandages.  The patient tolerated  the procedure well and was taken to recovery room in stable condition.  Estimated blood loss was about 40-50 mL.  Complications were none.  Sponge, needle and instrument counts were correct.      Edsel Petrin. Dalbert Batman, M.D.  Electronically Signed     HMI/MEDQ  D:  11/30/2008  T:  12/01/2008  Job:  [id number]   cc:   [name], M.D.

## 2011-02-20 NOTE — Assessment & Plan Note (Signed)
OFFICE VISIT   [name], [name]  DOB:  May 11, 1951                                       03/15/2010  [id number]   CHIEF COMPLAINT:  Right leg pain.   HISTORY OF PRESENT ILLNESS:  The patient is a 60 year old female  referred by Dr. [name] for complaints of left lower extremity coolness  and pain.  Chronic medical problems include diabetes, hypertension and  elevated cholesterol.  These are all currently stable and followed by  Dr. [name].  The patient states that her right leg feels cold to the  touch chronically.  This has been going on several months.  She  intermittently does have some pain in her right foot but this was  improved somewhat after starting colchicine and allopurinol for gout.  She can only walk about a block currently before developing calf pain on  the right side.  She states that sometimes she has pain on the left side  but the right is worse.  She had no history of ulcerations on the feet  or ankles.  She states the only thing that relieves her symptoms of calf  pain is rest.   She currently smokes one quarter pack of cigarettes per day and greater  than 3 minutes spent today regarding smoking cessation counseling.   She has extensive history of infections in both lower extremities.  She  had a fairly severe abscess in her left groin which grew out MRSA.  This  was in February of 2010 and was drained by Dr. Dalbert Batman.  She has also  previously had an abscess in her right groin which sounds similar but  this drained spontaneously.  She has also previously had a total  hysterectomy and developed a ventral hernia after this.  She had a mesh  repair of this and this became infected approximately 5 years ago and  required debridement at [facility] in [place].   Past surgical history is as mentioned above and in addition she had a  hip replacement in November of 2010, thyroidectomy, laparoscopic  cholecystectomy and a  left arm ORIF.   Past medical history is as listed above.   SOCIAL HISTORY:  She is married.  She is disabled.  Smoking history is  as listed above.  She does not consume alcohol regularly.  Please see  intake referral form for details regarding that.   Her medications include Colcrys, Nexium, gemfibrozil, albuterol,  hydrochlorothiazide, Allegra, NovoLog, Synthroid, amlodipine, Vesicare,  vitamin D, allopurinol, metoprolol.   She has an allergy listed to ACE inhibitors which causes angioedema.   PHYSICAL EXAM:  Blood pressure is 148/83 in the left arm, heart rate 64  and regular, oxygen saturation 98% on room air.  HEENT is unremarkable.  Neck has 2+ carotid pulses without bruit.  Chest is clear to  auscultation.  Cardiac exam is regular rate and rhythm without murmur.  Abdomen is soft, nontender, nondistended, obese.  Multiple well-healed  scars in the right upper quadrant and lower midline.  No masses.  Extremities:  She has 1+ radial pulses bilaterally.  She has 2+ femoral  pulses bilaterally.  She has absent popliteal and pedal pulses  bilaterally.  She has a large scar in her left groin which extends two  thirds of the way around the circumference of her thigh.  She also has a  4 cm scar in her right groin.  Skin otherwise has no other open ulcers  or rashes.  Cap refill is approximately 5 seconds in the right foot, 3  seconds in the left foot.   She had bilateral ABIs performed on 12/19/2009 at [facility].  This showed an ABI on the right side of 0.48 and on the left  of 0.81.  She had diffuse plaque and multiple areas of narrowing in the  superficial femoral artery bilaterally.   I had a lengthy conversation with the patient today regarding her  current symptoms and her overall perfusion to her lower extremities.  She does have history and exam consistent with peripheral arterial  occlusive disease bilaterally.  The left leg actually has fairly   reasonable perfusion with an ABI of 0.81 and is essentially  asymptomatic.  However, she does have fairly severe symptoms in the  right lower extremity with early claudication and an ABI of 0.5.  However, she does not have any pending limb loss in this right lower  extremity and due to her body habitus as well as multiple prior  infections with MRSA and abscesses especially in her groin and lower  extremities I am somewhat reluctant to consider any procedures at this  point due to her high risk of infection.  I believe the best management  initially is going to be prescription for Pletal and a walking program  of 30 minutes daily.  These were given to her today.  We will also  obtain followup ABIs in 3 months.  If her symptoms continue to  deteriorate we will consider doing an arteriogram in the near future.  She will also try to quit smoking.  She will return sooner if her  symptoms become more debilitating before 3 months is up or if she  develops any nonhealing wounds in her foot.  I emphasized again that she  would be very high risk for infection for any procedure if she requires  this in the future.     Jessy Oto. Fields, MD  Electronically Signed   CEF/MEDQ  D:  03/16/2010  T:  03/16/2010  Job:  [id number]

## 2011-02-20 NOTE — Discharge Summary (Signed)
[name] [name], [name] [name]             ACCOUNT NO.:  [id number]   MEDICAL RECORD NO.:  [id number]          PATIENT TYPE:  INP   LOCATION:  5148                         FACILITY:  [facility]   PHYSICIAN:  [name], M.D.    DATE OF BIRTH:  12-24-1950   DATE OF ADMISSION:  11/28/2008  DATE OF DISCHARGE:  12/16/2008                               DISCHARGE SUMMARY   HISTORY OF PRESENT ILLNESS:  [name] [name] is a 60 year old African  American lady with past medical history significant for type 2 diabetes  mellitus, systemic hypertension, hypothyroidism, gastroesophageal reflux  disease, dyslipidemia, and morbid obesity.  She was admitted via the  emergency room at [facility] with 1-week history of  progressively worsening pain, swelling, and redness over the left upper  thigh region.  She apparently had a prior history of MRSA infection of  the lower abdominal area some years ago and was treated at [facility].  This time, she came to the emergency room at  [facility] after she failed outpatient treatment with doxycycline for  this infection of her left thigh.  She was found to have a large  cellulitis with abscess involving the left upper thigh, was admitted to  the hospital for intravenous antibiotics and surgical exploration.  On  admission, her white count was 33,700, glucose was 394, potassium was  2.7, BUN was 43, creatinine was 1.6.  Her vital signs were stable with a  blood pressure of 147/64, temperature of 97.8, heart rate of 63 regular,  respiratory rate of 20.  She was initially started on intravenous Zosyn  and vancomycin and an urgent surgical consult was requested on November 29, 2008.   HOSPITAL COURSE:  On admission, the patient was evaluated by Dr. Fanny Skates, surgeon who examined the patient and found a massively indurated  area with fluctuance in the left medial thigh and was difficult to  completely evaluate due to severe pain at  the slightest touch.  The area  extended to the crease of the thigh at the hip level and there were  cellulitic skin changes extending off to the left groin crease towards  the thigh area.  There is an old scar underneath her abdominal pannus.  The scar was not involved in the infection of the area.  The patient was  quickly turned off for surgical exploration.  She received several  courses of IV potassium runs and was taken to the OR for surgery.  Operative findings showed an extensively foul-smelling purulent fluid  and soft tissue in the left upper thigh, left vulvar area, and left  inguinal area.  This had a grayish dark color to it and a very foul  odor.  Aerobic/anaerobic and fungal cultures were obtained.  The skin  was debrided of subcutaneous tissue and muscle fascia.  The deep muscle  compartments actually was felt to be soft.  At the end of the case,  there was a large wound at least 30-cm long by about 20-cm wide.  The  entire devitalized tissue were debrided.  Estimated blood loss was  about  200 mL without any complications.  She was taken to the recovery room in  stable condition.  She was placed in the surgical ICU for several days  and plastic surgery consult was also requested.  The patient was seen by  Dr. Denese Killings who helped with wound care and dressing changes.  She has  required intravenous and narcotic pain medication via PCA pump for  several days as well as intravenous morphine prior to dressing changes.  Wound culture did grow polymicrobial organisms as well as proteus  sensitive to ciprofloxacin.  Intravenous Primaxin, clindamycin, and  ciprofloxacin were added for several days to cover Proteus and MRSA.  Antibiotics were later scaled back to just vancomycin and Zosyn which  she continued until today.  Physical therapy consult was requested for  hydrotherapy by wound care by the surgeons were continued.  The wound  was reported to be with decreased erythema and  induration and the  surface was granulating well.  On December 15, 2008, she was taken off of  intravenous analgesia prior to wound dressings and tried on p.o.  medications, which she was able to tolerate.  On December 06, 2008, her  hemoglobin dropped to 6.5.  She initially refused blood transfusion on  the basis of family history of Jehovah's Witness, but she eventually  concurred that she was not in that fate and consented for blood  transfusion.  She received 2 units of packed red blood cell transfusions  with improvement of her hemoglobin to about 8.  Her CBGs were checked  before meals and at bedtime and covered with sliding scale NovoLog  insulin throughout hospitalization.  She was on DVT prophylaxis as well  as GI stress ulcer prophylaxis.  Her white count returned back to within  normal limits and renal function normalized during hospitalization.  She  is not ambulant with a walker, tolerating full diet, and having regular  bowel movements.  She is able to tolerate wound dressing changes with  just p.o. narcotic pain medicines.  She has had no fevers, no chills.   PHYSICAL EXAMINATION:  VITAL SIGNS:  Stable.  CHEST:  Clear to auscultation.  HEART:  Heart sounds 1 and 2 are heard with no murmurs.  No S3 gallops.  ABDOMEN:  Obese, soft, and nontender.  No masses.  Bowel sounds are  present.  EXTREMITIES:  Wound over the left thigh with dressings.  She had trace  pedal edema of the legs.  There is no calf tenderness or swelling.  Peripheral pulses are present bilaterally.  CNS:  She is alert and oriented x3 with no focal neurological deficits.   LABORATORY DATA:  Latest laboratory data of December 15, 2008 shows white  count of 7.9, hemoglobin 8.4, hematocrit 24.4, platelet count of 497.  Sodium is 140, potassium 4.1, chloride is 102, bicarbonate of 29, BUN is  10, creatinine 1.34, glucose is 137.  She is now considered stable for  discharge to home.   DISCHARGE DIAGNOSES:  1.  Necrotizing fasciitis of the left thigh, status post incision and      drainage and debridement, status post completion of intravenous      antibiotics with Zosyn and vancomycin for 2 weeks.  She is to      continue wound dressings at home and might require plastic surgery      consult at some point for wound closure.  2. Hypokalemia on admission, repleted.  3. Renal insufficiency, resolved.  4. Type 2 diabetes  mellitus poorly controlled, now well controlled.  5. Impaired mobility with deconditioning currently receiving physical      therapy.   CONDITION ON DISCHARGE:  Stable.   DISPOSITION:  Home.   DISCHARGE MEDICATIONS:  1. Percocet 5/325 1-2 q.4 h. p.r.n. for pain.  2. Oxycodone 5 mg 1-2 p.o. q.4 h. p.r.n. for breakthrough pain.  3. Ferrous sulfate 325 mg p.o. b.i.d.  4. Nicotine patch 14 mg per day.  5. Xanax 0.5 mg b.i.d. p.r.n. for anxiety.  6. Lopid 600 mg b.i.d.  7. Levothyroxine 175 mcg daily.  8. Lasix 40 mg daily.  9. Azor 10/40 one tablet daily.  10.HCTZ 12.5 mg p.o. daily.  11.Metoprolol succinate ER 200 mg daily.  12.Nexium 40 mg daily.  13.NovoLog 70/30 40 units in the a.m. and 30 units in the p.m.   She has been arranged to have home health with wound dressings wet-to-  dry with Kerlix once a day.  She is to call the surgeon if she begin to  get any purulent or pus-like drainage from the wound.  She can use over-  the-counter stool softener while on narcotic pain medicines.  [facility] will be responsible for her repeat med supplies  that includes rolling walker, bedside commode, toilet bench by wound  care nurse.  She is to follow up with Dr. Dalbert Batman surgeon in about 1-2  weeks and is to follow up with me in about a week.  The plan of care has  been discussed with her and her husband and their questions have been  answered.      [name], M.D.  Electronically Signed     EA/MEDQ  D:  12/16/2008  T:  12/17/2008  Job:  [id number]

## 2011-02-23 NOTE — Assessment & Plan Note (Signed)
OFFICE VISIT   DAKSHA, MACATANGAY  DOB:  1950-11-28                                       09/21/2010  [id number]   The patient returns for followup today.  She is a 60 year old female  with peripheral arterial disease.  When she was last seen in November of  2011 she had a cellulitis in her lower extremity.  This has now  resolved.  She states she feels her walking has improved somewhat,  overall her symptoms are stable.   CHRONIC MEDICAL PROBLEMS:  Include diabetes, elevated cholesterol and  hypertension.  Unfortunately, she continues to smoke 3 to 4 cigarettes  per day.  Greater than 3 minutes today were spent regarding smoking  cessation counseling.   REVIEW OF SYSTEMS:  She denies any shortness of breath or chest pain.   PHYSICAL EXAMINATION:  Vital signs:  Blood pressure 163/83 in her left  arm, heart rate 76 and regular.  Temperature is 98.2.  Abdomen:  Obese,  soft, nontender, nondistended.  No masses.  Extremities:  She has 2+  femoral pulses bilaterally.  She has absent popliteal and pedal pulses.  She has no open ulcerations or wounds on her foot.   Most recent ABIs were 0.46 on the right 0.87 on the left.   In summary, the patient's symptoms overall are stable at this point.  As  mentioned previously, she has severe obesity and has had severe  infections in the groin requiring I and D in the past and she would be  at higher risk for any lower extremity revascularization.  She may be a  candidate for percutaneous revascularization if she requires in the  future but currently her symptoms are stable.  She does not have rest  pain.  She does not have open ulcers on her feet.  I believe the best  option at this point is continued risk factor modification of her  underlying illnesses as well as smoking cessation.  She will follow up  in the office in 3 months' time for further review.     Jessy Oto. Fields, MD  Electronically Signed   CEF/MEDQ  D:  09/22/2010  T:  09/22/2010  Job:  [id number]

## 2011-03-08 ENCOUNTER — Ambulatory Visit: Payer: Medicare Other | Admitting: Vascular Surgery

## 2011-03-12 ENCOUNTER — Encounter: Payer: Self-pay | Admitting: Vascular Surgery

## 2011-04-05 ENCOUNTER — Ambulatory Visit: Payer: Medicare Other | Admitting: Vascular Surgery

## 2011-04-25 ENCOUNTER — Encounter: Payer: Self-pay | Admitting: Vascular Surgery

## 2011-04-26 ENCOUNTER — Encounter (INDEPENDENT_AMBULATORY_CARE_PROVIDER_SITE_OTHER): Payer: Medicaid Other

## 2011-04-26 ENCOUNTER — Encounter: Payer: Self-pay | Admitting: Vascular Surgery

## 2011-04-26 ENCOUNTER — Ambulatory Visit (INDEPENDENT_AMBULATORY_CARE_PROVIDER_SITE_OTHER): Payer: Medicaid Other | Admitting: Vascular Surgery

## 2011-04-26 VITALS — BP 177/99 | HR 92 | Ht 65.5 in | Wt 290.0 lb

## 2011-04-26 DIAGNOSIS — Z720 Tobacco use: Secondary | ICD-10-CM

## 2011-04-26 DIAGNOSIS — I739 Peripheral vascular disease, unspecified: Secondary | ICD-10-CM

## 2011-04-26 DIAGNOSIS — F17200 Nicotine dependence, unspecified, uncomplicated: Secondary | ICD-10-CM

## 2011-04-26 DIAGNOSIS — I70219 Atherosclerosis of native arteries of extremities with intermittent claudication, unspecified extremity: Secondary | ICD-10-CM

## 2011-04-26 NOTE — Progress Notes (Signed)
F/U claudication with labs today,

## 2011-04-26 NOTE — Progress Notes (Signed)
Subjective:     Patient ID: [name] [name], female   DOB: [birth year], 60 y.o.   MRN: [id number]  HPI Patient is a 60 year old female who we have been following for peripheral arterial disease she was last seen in December of 2011. She does not really complain of claudication symptoms at this time. She primarily complains of right hip pain when ambulating. She has had pain in her right hip ever since her right hip her placement. She is currently followed by Dr. Rush Farmer for this.  Chronic medical problems include diabetes elevated cholesterol hypertension. Chills or continues to smoke about a half a pack of cigarettes per day. Greater than 3 minutes they were spent regarding smoking cessation counseling.    Review of Systems  Cardiac: Denies chest pain  Respiratory: Gets short of breath with vigorous activity     Objective:   Physical Exam HEENT: Negative  Neck: No bruit  Chest: Clear to auscultation  Cardiac: Regular rate and rhythm without murmur  Abdomen: Obese soft nontender nondistended no mass  Extremities: No significant edema, 1-2+ femoral pulses bilaterally  Skin: No open ulcer or rash  Data: Bilateral ABIs were reviewed and interpreted by myself today shows ABI on the left side 0.8 to right is 0.55 these are essentially unchanged from November of 2011      Assessment:    Patient was stable peripheral arterial disease bilateral lower extremities right greater than left. 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    Plan:    #1: Smoking cessation #2: Needs to continue to try to lose weight  #3: Needs to walk 30 minutes daily; she currently only walks every 2-3 days  #4 Followup ABIs in 6 months

## 2011-07-31 ENCOUNTER — Other Ambulatory Visit: Payer: Self-pay | Admitting: Orthopaedic Surgery

## 2011-07-31 ENCOUNTER — Encounter (HOSPITAL_COMMUNITY): Payer: PRIVATE HEALTH INSURANCE

## 2011-07-31 ENCOUNTER — Ambulatory Visit (HOSPITAL_COMMUNITY)
Admission: RE | Admit: 2011-07-31 | Discharge: 2011-07-31 | Disposition: A | Discharge status: Home / Self Care | Payer: PRIVATE HEALTH INSURANCE | Source: Ambulatory Visit | Attending: Orthopaedic Surgery | Admitting: Orthopaedic Surgery

## 2011-07-31 ENCOUNTER — Other Ambulatory Visit (HOSPITAL_COMMUNITY): Payer: Self-pay | Admitting: Orthopaedic Surgery

## 2011-07-31 DIAGNOSIS — Z01818 Encounter for other preprocedural examination: Secondary | ICD-10-CM

## 2011-07-31 DIAGNOSIS — J449 Chronic obstructive pulmonary disease, unspecified: Secondary | ICD-10-CM | POA: Insufficient documentation

## 2011-07-31 DIAGNOSIS — J4489 Other specified chronic obstructive pulmonary disease: Secondary | ICD-10-CM | POA: Insufficient documentation

## 2011-07-31 DIAGNOSIS — R9431 Abnormal electrocardiogram [ECG] [EKG]: Secondary | ICD-10-CM | POA: Insufficient documentation

## 2011-07-31 DIAGNOSIS — T8489XA Other specified complication of internal orthopedic prosthetic devices, implants and grafts, initial encounter: Secondary | ICD-10-CM | POA: Insufficient documentation

## 2011-07-31 DIAGNOSIS — I10 Essential (primary) hypertension: Secondary | ICD-10-CM | POA: Insufficient documentation

## 2011-07-31 DIAGNOSIS — Z01812 Encounter for preprocedural laboratory examination: Secondary | ICD-10-CM | POA: Insufficient documentation

## 2011-07-31 DIAGNOSIS — F17200 Nicotine dependence, unspecified, uncomplicated: Secondary | ICD-10-CM | POA: Insufficient documentation

## 2011-07-31 DIAGNOSIS — I517 Cardiomegaly: Secondary | ICD-10-CM | POA: Insufficient documentation

## 2011-07-31 DIAGNOSIS — Y831 Surgical operation with implant of artificial internal device as the cause of abnormal reaction of the patient, or of later complication, without mention of misadventure at the time of the procedure: Secondary | ICD-10-CM | POA: Insufficient documentation

## 2011-07-31 DIAGNOSIS — Z01810 Encounter for preprocedural cardiovascular examination: Secondary | ICD-10-CM | POA: Insufficient documentation

## 2011-07-31 LAB — BASIC METABOLIC PANEL
BUN: 21 mg/dL (ref 6–23)
CO2: 28 mEq/L (ref 19–32)
Calcium: 9.4 mg/dL (ref 8.4–10.5)
Chloride: 101 mEq/L (ref 96–112)
Creatinine, Ser: 1.11 mg/dL — ABNORMAL HIGH (ref 0.50–1.10)
GFR calc Af Amer: 61 mL/min — ABNORMAL LOW (ref >90)
GFR calc non Af Amer: 53 mL/min — ABNORMAL LOW (ref >90)
Glucose, Bld: 194 mg/dL — ABNORMAL HIGH (ref 70–99)
Potassium: 3.9 mEq/L (ref 3.5–5.1)
Sodium: 141 mEq/L (ref 135–145)

## 2011-07-31 LAB — URINE MICROSCOPIC-ADD ON

## 2011-07-31 LAB — PROTIME-INR
INR: 0.92 (ref 0.00–1.49)
Prothrombin Time: 12.6 seconds (ref 11.6–15.2)

## 2011-07-31 LAB — C-REACTIVE PROTEIN: CRP: 1.07 mg/dL — ABNORMAL HIGH (ref <0.60)

## 2011-07-31 LAB — SEDIMENTATION RATE: Sed Rate: 16 mm/hr (ref 0–22)

## 2011-07-31 LAB — DIFFERENTIAL
Basophils Absolute: 0 10*3/uL (ref 0.0–0.1)
Basophils Relative: 0 % (ref 0–1)
Eosinophils Absolute: 0.1 10*3/uL (ref 0.0–0.7)
Eosinophils Relative: 1 % (ref 0–5)
Lymphocytes Relative: 20 % (ref 12–46)
Lymphs Abs: 2.3 10*3/uL (ref 0.7–4.0)
Monocytes Absolute: 0.6 10*3/uL (ref 0.1–1.0)
Monocytes Relative: 5 % (ref 3–12)
Neutro Abs: 8.6 10*3/uL — ABNORMAL HIGH (ref 1.7–7.7)
Neutrophils Relative %: 74 % (ref 43–77)

## 2011-07-31 LAB — URINALYSIS, ROUTINE W REFLEX MICROSCOPIC
Bilirubin Urine: NEGATIVE
Glucose, UA: NEGATIVE mg/dL
Hgb urine dipstick: NEGATIVE
Ketones, ur: NEGATIVE mg/dL
Leukocytes, UA: NEGATIVE
Nitrite: NEGATIVE
Protein, ur: 100 mg/dL — AB
Specific Gravity, Urine: 1.023 (ref 1.005–1.030)
Urobilinogen, UA: 0.2 mg/dL (ref 0.0–1.0)
pH: 5 (ref 5.0–8.0)

## 2011-07-31 LAB — SURGICAL PCR SCREEN
MRSA, PCR: NEGATIVE
Staphylococcus aureus: NEGATIVE

## 2011-07-31 LAB — CBC
HCT: 40.5 % (ref 36.0–46.0)
Hemoglobin: 12.7 g/dL (ref 12.0–15.0)
MCH: 27 pg (ref 26.0–34.0)
MCHC: 31.4 g/dL (ref 30.0–36.0)
MCV: 86 fL (ref 78.0–100.0)
Platelets: 273 10*3/uL (ref 150–400)
RBC: 4.71 MIL/uL (ref 3.87–5.11)
RDW: 15.7 % — ABNORMAL HIGH (ref 11.5–15.5)
WBC: 11.7 10*3/uL — ABNORMAL HIGH (ref 4.0–10.5)

## 2011-07-31 LAB — APTT: aPTT: 38 seconds — ABNORMAL HIGH (ref 24–37)

## 2011-08-02 LAB — TYPE AND SCREEN
ABO/RH(D): O POS
Antibody Screen: NEGATIVE

## 2011-08-03 ENCOUNTER — Inpatient Hospital Stay (HOSPITAL_COMMUNITY): Payer: PRIVATE HEALTH INSURANCE

## 2011-08-03 ENCOUNTER — Inpatient Hospital Stay (HOSPITAL_COMMUNITY)
Admission: RE | Admit: 2011-08-03 | Discharge: 2011-08-06 | DRG: 467 | Disposition: A | Discharge status: Home Health | Payer: PRIVATE HEALTH INSURANCE | Source: Ambulatory Visit | Attending: Orthopaedic Surgery | Admitting: Orthopaedic Surgery

## 2011-08-03 DIAGNOSIS — G4733 Obstructive sleep apnea (adult) (pediatric): Secondary | ICD-10-CM | POA: Diagnosis present

## 2011-08-03 DIAGNOSIS — N183 Chronic kidney disease, stage 3 unspecified: Secondary | ICD-10-CM | POA: Diagnosis present

## 2011-08-03 DIAGNOSIS — T8489XA Other specified complication of internal orthopedic prosthetic devices, implants and grafts, initial encounter: Principal | ICD-10-CM | POA: Diagnosis present

## 2011-08-03 DIAGNOSIS — E119 Type 2 diabetes mellitus without complications: Secondary | ICD-10-CM | POA: Diagnosis present

## 2011-08-03 DIAGNOSIS — Z794 Long term (current) use of insulin: Secondary | ICD-10-CM

## 2011-08-03 DIAGNOSIS — D62 Acute posthemorrhagic anemia: Secondary | ICD-10-CM | POA: Diagnosis not present

## 2011-08-03 DIAGNOSIS — M129 Arthropathy, unspecified: Secondary | ICD-10-CM | POA: Diagnosis present

## 2011-08-03 DIAGNOSIS — Z01810 Encounter for preprocedural cardiovascular examination: Secondary | ICD-10-CM

## 2011-08-03 DIAGNOSIS — Z01812 Encounter for preprocedural laboratory examination: Secondary | ICD-10-CM

## 2011-08-03 DIAGNOSIS — M76899 Other specified enthesopathies of unspecified lower limb, excluding foot: Secondary | ICD-10-CM | POA: Diagnosis present

## 2011-08-03 DIAGNOSIS — I4510 Unspecified right bundle-branch block: Secondary | ICD-10-CM | POA: Diagnosis present

## 2011-08-03 DIAGNOSIS — M25559 Pain in unspecified hip: Secondary | ICD-10-CM | POA: Diagnosis present

## 2011-08-03 DIAGNOSIS — Y831 Surgical operation with implant of artificial internal device as the cause of abnormal reaction of the patient, or of later complication, without mention of misadventure at the time of the procedure: Secondary | ICD-10-CM | POA: Diagnosis present

## 2011-08-03 DIAGNOSIS — Z96649 Presence of unspecified artificial hip joint: Secondary | ICD-10-CM

## 2011-08-03 DIAGNOSIS — F17200 Nicotine dependence, unspecified, uncomplicated: Secondary | ICD-10-CM | POA: Diagnosis present

## 2011-08-03 DIAGNOSIS — Z79899 Other long term (current) drug therapy: Secondary | ICD-10-CM

## 2011-08-03 DIAGNOSIS — K219 Gastro-esophageal reflux disease without esophagitis: Secondary | ICD-10-CM | POA: Diagnosis present

## 2011-08-03 DIAGNOSIS — I129 Hypertensive chronic kidney disease with stage 1 through stage 4 chronic kidney disease, or unspecified chronic kidney disease: Secondary | ICD-10-CM | POA: Diagnosis present

## 2011-08-03 DIAGNOSIS — E079 Disorder of thyroid, unspecified: Secondary | ICD-10-CM | POA: Diagnosis present

## 2011-08-03 LAB — GLUCOSE, CAPILLARY
Glucose-Capillary: 183 mg/dL — ABNORMAL HIGH (ref 70–99)
Glucose-Capillary: 215 mg/dL — ABNORMAL HIGH (ref 70–99)
Glucose-Capillary: 215 mg/dL — ABNORMAL HIGH (ref 70–99)
Glucose-Capillary: 169 mg/dL — ABNORMAL HIGH (ref 70–99)
Glucose-Capillary: 131 mg/dL — ABNORMAL HIGH (ref 70–99)

## 2011-08-03 LAB — GRAM STAIN

## 2011-08-04 LAB — BASIC METABOLIC PANEL
BUN: 18 mg/dL (ref 6–23)
CO2: 30 mEq/L (ref 19–32)
Calcium: 8.1 mg/dL — ABNORMAL LOW (ref 8.4–10.5)
Chloride: 104 mEq/L (ref 96–112)
Creatinine, Ser: 1.28 mg/dL — ABNORMAL HIGH (ref 0.50–1.10)
GFR calc Af Amer: 52 mL/min — ABNORMAL LOW (ref >90)
GFR calc non Af Amer: 45 mL/min — ABNORMAL LOW (ref >90)
Glucose, Bld: 102 mg/dL — ABNORMAL HIGH (ref 70–99)
Potassium: 3.6 mEq/L (ref 3.5–5.1)
Sodium: 140 mEq/L (ref 135–145)

## 2011-08-04 LAB — CBC
HCT: 33.4 % — ABNORMAL LOW (ref 36.0–46.0)
Hemoglobin: 10.2 g/dL — ABNORMAL LOW (ref 12.0–15.0)
MCH: 26.8 pg (ref 26.0–34.0)
MCHC: 30.5 g/dL (ref 30.0–36.0)
MCV: 87.7 fL (ref 78.0–100.0)
Platelets: 226 10*3/uL (ref 150–400)
RBC: 3.81 MIL/uL — ABNORMAL LOW (ref 3.87–5.11)
RDW: 15.8 % — ABNORMAL HIGH (ref 11.5–15.5)
WBC: 10.1 10*3/uL (ref 4.0–10.5)

## 2011-08-04 LAB — GLUCOSE, CAPILLARY
Glucose-Capillary: 112 mg/dL — ABNORMAL HIGH (ref 70–99)
Glucose-Capillary: 180 mg/dL — ABNORMAL HIGH (ref 70–99)
Glucose-Capillary: 153 mg/dL — ABNORMAL HIGH (ref 70–99)
Glucose-Capillary: 109 mg/dL — ABNORMAL HIGH (ref 70–99)

## 2011-08-05 LAB — BASIC METABOLIC PANEL
BUN: 19 mg/dL (ref 6–23)
CO2: 28 mEq/L (ref 19–32)
Calcium: 8.7 mg/dL (ref 8.4–10.5)
Chloride: 103 mEq/L (ref 96–112)
Creatinine, Ser: 1.21 mg/dL — ABNORMAL HIGH (ref 0.50–1.10)
GFR calc Af Amer: 55 mL/min — ABNORMAL LOW (ref >90)
GFR calc non Af Amer: 48 mL/min — ABNORMAL LOW (ref >90)
Glucose, Bld: 79 mg/dL (ref 70–99)
Potassium: 3.3 mEq/L — ABNORMAL LOW (ref 3.5–5.1)
Sodium: 136 mEq/L (ref 135–145)

## 2011-08-05 LAB — CBC
HCT: 30.1 % — ABNORMAL LOW (ref 36.0–46.0)
Hemoglobin: 9.3 g/dL — ABNORMAL LOW (ref 12.0–15.0)
MCH: 26.5 pg (ref 26.0–34.0)
MCHC: 30.9 g/dL (ref 30.0–36.0)
MCV: 85.8 fL (ref 78.0–100.0)
Platelets: 212 10*3/uL (ref 150–400)
RBC: 3.51 MIL/uL — ABNORMAL LOW (ref 3.87–5.11)
RDW: 15.8 % — ABNORMAL HIGH (ref 11.5–15.5)
WBC: 9.6 10*3/uL (ref 4.0–10.5)

## 2011-08-05 LAB — GLUCOSE, CAPILLARY
Glucose-Capillary: 75 mg/dL (ref 70–99)
Glucose-Capillary: 194 mg/dL — ABNORMAL HIGH (ref 70–99)
Glucose-Capillary: 226 mg/dL — ABNORMAL HIGH (ref 70–99)
Glucose-Capillary: 224 mg/dL — ABNORMAL HIGH (ref 70–99)

## 2011-08-05 NOTE — Op Note (Signed)
NAMEKATESSA, VATALARO             ACCOUNT NO.:  [id number]  MEDICAL RECORD NO.:  [id number]  LOCATION:  [id number]                         FACILITY:  [facility]  PHYSICIAN:  Lind Guest. Ninfa Linden, M.D.DATE OF BIRTH:  April 27, 1951  DATE OF PROCEDURE:  08/03/2011 DATE OF DISCHARGE:                              OPERATIVE REPORT   PREOPERATIVE DIAGNOSIS:  Painful right total hip arthroplasty.  POSTOPERATIVE DIAGNOSES: 1. 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 2. Bursitis, right hip.  PROCEDURES: 1. Irrigation and debridement of right hip superficial and deep     tissues. 2. Exchange of metal acetabular liner to polyethylene liner, and a new     hip ball.  COMPONENTS:  32 +4 neutral polyethylene liner, 32 +13 metal hip ball.  SURGEON:  Lind Guest. Ninfa Linden, M.D.  ASSISTANT:  Epimenio Foot, P.A.C.  ANESTHESIA:  General.  ANTIBIOTIC:  2 g IV Ancef.  ESTIMATED BLOOD LOSS:  350 mL.  COMPLICATIONS:  None.  INDICATIONS:  Ms. Russo is a 60 year old female who 2 years ago underwent a right total hip arthroplasty through an anterolateral approach.  She had a metal liner and a metal hip ball.  She has had pain in her hip over the last year and has not been getting better.  All of her peripheral laboratory exams showed no evidence of infection.  An MRI did not show any evidence of pseudotumor and no fluid collection.  She is a morbidly obese individual.  We are going to assess her back feeling that it may be coming from her back and this did show some changes in her back, but she felt the pain she is having in her groin and this is worsening with  weightbearing.  She wanted to have the metal liner exchanged and assessment of why she was hurting.  The risks and benefits of surgery were explained to her in length and she understood about proceeding with surgery.  PROCEDURE IN DETAIL:  After informed consent was obtained, the appropriate right hip was marked.   She was brought to the operating room, placed supine on the operative table.  General anesthesia was then obtained.  A Foley catheter was placed and she was turned into the lateral decubitus position with hip positioners in the front and the back, and an axillary roll placed and padding was done on operative arms.  Pads were placed under her down on operative left leg as well. Right hip was then prepped and draped with ChloraPrep and sterile drapes.  A time-out was called and she was identified as the correct patient and correct right hip.  I then made incision directly over her previous incision and dissected all the way down to the iliotibial band. When I opened the iliotibial band, there was serous fluid collection consistent with bursitis.  We did obtain cultures of this prior to giving antibiotics and even deep cultures as well.  Once I cleaned out the superficial tissue, we proceeded with a direct anterolateral approach to the hip.  I took the gluteus medius and minimus to back off the greater trochanter and reflected these anteriorly.  We dissected down to the hip capsule and opened up the hip capsule,  and there was no significant fluid at all in her capsule.  We did send cultures off from that area as well.  We then gave her antibiotics.  We then dislocated the hip and removed the size 36 hip ball.  I assessed the femoral component and it was stable with no evidence of infection and no loosening that you could see. The acetabulum was also assessed and found to not be loose.  We then removed the metal liner and again found no evidence of loosening.  Next, we placed a 32 +4 neutral polyethylene liner, and then we trialed several hip balls and since she went up to a 36 hip ball that we had to go down to 32 on a metal liner.  We had to go 32 +13 and this offered the most stability.  We then used pulsatile lavage to copiously irrigate the tissues.  Again, I closed the joint capsule  with interrupted #1 Ethibond suture followed by reapproximating the gluteus medius and minimus to the greater trochanter with #1 Ethibond.  We closed the IT band with interrupted #1 Vicryl suture followed by 2-0 Vicryl in the deep and subcutaneous tissue and staples on the skin.  Xeroform followed by sterile dressing was applied.  She was rolled back into a supine position, awakened, extubated, and taken to recovery room in stable condition.  All final counts were correct. There were no complications noted.  Of note, Phillips Hay, P.A.C was present during the entirety of case and her assistance was integral in getting the acetabular liner out and performing the revision components of the case.     Lind Guest. Ninfa Linden, M.D.     CYB/MEDQ  D:  08/03/2011  T:  08/03/2011  Job:  [id number]  Electronically Signed by Jean Rosenthal M.D. on 08/05/2011 10:22:09 PM

## 2011-08-05 NOTE — H&P (Signed)
[name] [name], [name] [name]             ACCOUNT NO.:  [id number]  MEDICAL RECORD NO.:  [id number]  LOCATION:  [id number]                         FACILITY:  [facility]  PHYSICIAN:  Lind Guest. Ninfa Linden, M.D.DATE OF BIRTH:  [birth year]  DATE OF ADMISSION:  08/03/2011 DATE OF DISCHARGE:                             HISTORY & PHYSICAL   CHIEF COMPLAINT:  Right hip pain.  HISTORY OF PRESENT ILLNESS:  [name] [name] is well known to me.  She had a right total hip arthroplasty in 2010.  She has had continued hip pain that has been worsening.  We did place some metal liner, but was not any kind of recalled  component.  We did an MRI of that hip and showed no evidence of loosening, no evidence of pseudotumor fluid collection.  We worked up her back due to her morbid obesity.  This showed some arthritic changes, but she still complains of pain mainly in the groin and the hip when she puts weight on her hip.  At this point, we have no choice, but to proceed with an exploration of her right hip and assessment of the components for infection with the idea of obtaining cultures and then switching up metal liner for a polyethylene liner and change at the hip level, they will revise the acetabular component as needed.  PAST MEDICAL HISTORY: 1. Diabetes. 2. High blood pressure. 3. Thyroid disease. 4. Acid reflux. 5. Arthritis. 6. Aspirin. 7. Bladder problems. 8. Sleep apnea.  MEDICATIONS: 1. Amlodipine. 2. Multivitamin. 3. Gemfibrozil. 4. NovoLog. 5. Flonase. 6. Allegra. 7. Ventolin. 8. Clonidine. 9. Cilostazol. 10.Nexium. 11.Hydrochlorothiazide. 12.Allopurinol. 13.Colcrys. 14.Lasix. 15.Benicar. 16.Levoxyl. 17.Meloxicam. 18.Percocet.  ALLERGIES: 1. MORPHINE. 2. ACE INHIBITOR.  SOCIAL HISTORY:  She does smoke half a pack a day and has done that for many years.  She is on disability.  She is married.  She drinks alcohol on occasion.  FAMILY MEDICAL HISTORY:  Diabetes and high blood  pressure.  REVIEW OF SYSTEMS:  Negative for chest pain, shortness of breath, fever, chills, nausea, vomiting.  PHYSICAL EXAMINATION:  VITAL SIGNS:  She is afebrile.  Stable vital signs and it can be seen on her chart. GENERAL:  She is alert and oriented x3, in no acute distress or obvious discomfort. HEENT:  Normocephalic, atraumatic.  Pupils equal, round, reactive to light. NECK:  Obese but supple. LUNGS:  Clear auscultation bilaterally. HEART:  Regular rate and rhythm. ABDOMEN:  Obese, but benign. EXTREMITIES:  Right hip shows fluid range of motion passively and actively with no pain on range of motion, but only pain on weightbearing.  X-RAYS:  Again reviewed and I did not see any evidence of loosening of acetabular component, or any complicating features.  ASSESSMENT:  Painful right total hip arthroplasty.  PLAN:  Again, we will proceed to the operating room today for exploration of her hip on the right side and possible exchange of components as necessary.  She understands if there is an infection that we find, that we will need to remove all components and treat that accordingly.     Lind Guest. Ninfa Linden, M.D.     CYB/MEDQ  D:  08/03/2011  T:  08/03/2011  Job:  [id number]  Electronically Signed by Jean Rosenthal M.D. on 08/05/2011 10:22:06 PM

## 2011-08-06 LAB — WOUND CULTURE
Culture: NO GROWTH
Culture: NO GROWTH

## 2011-08-06 LAB — CBC
HCT: 26.9 % — ABNORMAL LOW (ref 36.0–46.0)
Hemoglobin: 8.3 g/dL — ABNORMAL LOW (ref 12.0–15.0)
MCH: 26.3 pg (ref 26.0–34.0)
MCHC: 30.9 g/dL (ref 30.0–36.0)
MCV: 85.4 fL (ref 78.0–100.0)
Platelets: 215 10*3/uL (ref 150–400)
RBC: 3.15 MIL/uL — ABNORMAL LOW (ref 3.87–5.11)
RDW: 15.8 % — ABNORMAL HIGH (ref 11.5–15.5)
WBC: 10.5 10*3/uL (ref 4.0–10.5)

## 2011-08-06 LAB — BASIC METABOLIC PANEL
BUN: 16 mg/dL (ref 6–23)
CO2: 29 mEq/L (ref 19–32)
Calcium: 8.6 mg/dL (ref 8.4–10.5)
Chloride: 102 mEq/L (ref 96–112)
Creatinine, Ser: 1.27 mg/dL — ABNORMAL HIGH (ref 0.50–1.10)
GFR calc Af Amer: 52 mL/min — ABNORMAL LOW (ref >90)
GFR calc non Af Amer: 45 mL/min — ABNORMAL LOW (ref >90)
Glucose, Bld: 166 mg/dL — ABNORMAL HIGH (ref 70–99)
Potassium: 3.5 mEq/L (ref 3.5–5.1)
Sodium: 136 mEq/L (ref 135–145)

## 2011-08-06 LAB — GLUCOSE, CAPILLARY: Glucose-Capillary: 173 mg/dL — ABNORMAL HIGH (ref 70–99)

## 2011-08-07 NOTE — Discharge Summary (Signed)
  NAMEAPURVA, GAROFALO             ACCOUNT NO.:  [id number]  MEDICAL RECORD NO.:  [id number]  LOCATION:  [id number]                         FACILITY:  [facility]  PHYSICIAN:  Lind Guest. Ninfa Linden, M.D.DATE OF BIRTH:  24-Apr-1951  DATE OF ADMISSION:  08/03/2011 DATE OF DISCHARGE:  08/06/2011                              DISCHARGE SUMMARY   ADMITTING DIAGNOSIS:  Painful right total hip arthroplasty.  DISCHARGE DIAGNOSIS:  Painful right total hip arthroplasty.  PROCEDURES: 1. Irrigation and debridement of right hip with irrigation of     superficial tissues only. 2. Exchange of metal acetabular liner to polyethylene liner. 3. Exchange of hip ball.  HOSPITAL COURSE:  Ms. Scarborough is a 60 year old female with a painful right total hip arthroplasty.  She had a metal-on-metal implant and when we took her to the operating room, we found the implants to be intact. She likely was having metallosis, so we changed out the polyethylene liner and put a new metal head on a polyethylene liner.  She tolerated this well and was admitted as an inpatient.  Her postoperative course showed that she had acute blood loss anemia, which she did not require transfusion.  By the day of discharge, she was afebrile, has had stable vital signs, ambulating well and was asymptomatic and so she could be discharged safely to home.  DISPOSITION:  Discharged home.  DISCHARGE INSTRUCTIONS:  While she is at home, will get home health PT to work on balance, gait training, coordination, and anterior hip precautions.  She will keep her incision clean and dry.  I will see her back in 2 weeks for followup.  DISCHARGE MEDICATIONS: 1. Xarelto 10 mg p.o. daily. 2. Percocet as needed for pain. 3. Robaxin as needed for spasms. 4. See medical reconciliation orders for continuing all her same home     medications as before.     Lind Guest. Ninfa Linden, M.D.     CYB/MEDQ  D:  08/06/2011  T:  08/06/2011  Job:   [id number]  Electronically Signed by Jean Rosenthal M.D. on 08/07/2011 08:00:21 PM

## 2011-08-08 LAB — ANAEROBIC CULTURE

## 2011-10-25 ENCOUNTER — Ambulatory Visit (INDEPENDENT_AMBULATORY_CARE_PROVIDER_SITE_OTHER): Payer: PRIVATE HEALTH INSURANCE | Admitting: *Deleted

## 2011-10-25 DIAGNOSIS — I739 Peripheral vascular disease, unspecified: Secondary | ICD-10-CM

## 2012-01-18 ENCOUNTER — Other Ambulatory Visit: Payer: Self-pay | Admitting: Vascular Surgery

## 2012-03-10 ENCOUNTER — Other Ambulatory Visit: Payer: Self-pay | Admitting: Internal Medicine

## 2012-03-10 DIAGNOSIS — Z1231 Encounter for screening mammogram for malignant neoplasm of breast: Secondary | ICD-10-CM

## 2012-03-25 ENCOUNTER — Ambulatory Visit
Admission: RE | Admit: 2012-03-25 | Discharge: 2012-03-25 | Disposition: A | Discharge status: Home / Self Care | Payer: PRIVATE HEALTH INSURANCE | Source: Ambulatory Visit | Attending: Internal Medicine | Admitting: Internal Medicine

## 2012-03-25 DIAGNOSIS — Z1231 Encounter for screening mammogram for malignant neoplasm of breast: Secondary | ICD-10-CM

## 2012-05-04 ENCOUNTER — Other Ambulatory Visit: Payer: Self-pay | Admitting: Vascular Surgery

## 2012-08-01 ENCOUNTER — Other Ambulatory Visit: Payer: Self-pay | Admitting: Thoracic Diseases

## 2013-02-02 ENCOUNTER — Emergency Department (HOSPITAL_COMMUNITY): Payer: PRIVATE HEALTH INSURANCE

## 2013-02-02 ENCOUNTER — Other Ambulatory Visit: Payer: Self-pay

## 2013-02-02 ENCOUNTER — Inpatient Hospital Stay (HOSPITAL_COMMUNITY)
Admission: EM | Admit: 2013-02-02 | Discharge: 2013-02-20 | DRG: 853 | Disposition: A | Discharge status: Skilled Nursing Facility | Payer: PRIVATE HEALTH INSURANCE | Attending: Internal Medicine | Admitting: Internal Medicine

## 2013-02-02 ENCOUNTER — Inpatient Hospital Stay (HOSPITAL_COMMUNITY): Payer: PRIVATE HEALTH INSURANCE

## 2013-02-02 DIAGNOSIS — D72829 Elevated white blood cell count, unspecified: Secondary | ICD-10-CM

## 2013-02-02 DIAGNOSIS — R197 Diarrhea, unspecified: Secondary | ICD-10-CM | POA: Diagnosis present

## 2013-02-02 DIAGNOSIS — E119 Type 2 diabetes mellitus without complications: Secondary | ICD-10-CM

## 2013-02-02 DIAGNOSIS — J4489 Other specified chronic obstructive pulmonary disease: Secondary | ICD-10-CM | POA: Diagnosis present

## 2013-02-02 DIAGNOSIS — J449 Chronic obstructive pulmonary disease, unspecified: Secondary | ICD-10-CM | POA: Diagnosis present

## 2013-02-02 DIAGNOSIS — N179 Acute kidney failure, unspecified: Secondary | ICD-10-CM

## 2013-02-02 DIAGNOSIS — N058 Unspecified nephritic syndrome with other morphologic changes: Secondary | ICD-10-CM | POA: Diagnosis present

## 2013-02-02 DIAGNOSIS — I10 Essential (primary) hypertension: Secondary | ICD-10-CM

## 2013-02-02 DIAGNOSIS — E871 Hypo-osmolality and hyponatremia: Secondary | ICD-10-CM | POA: Diagnosis present

## 2013-02-02 DIAGNOSIS — L03317 Cellulitis of buttock: Secondary | ICD-10-CM

## 2013-02-02 DIAGNOSIS — D62 Acute posthemorrhagic anemia: Secondary | ICD-10-CM

## 2013-02-02 DIAGNOSIS — L0231 Cutaneous abscess of buttock: Secondary | ICD-10-CM

## 2013-02-02 DIAGNOSIS — Z6841 Body Mass Index (BMI) 40.0 and over, adult: Secondary | ICD-10-CM

## 2013-02-02 DIAGNOSIS — K5900 Constipation, unspecified: Secondary | ICD-10-CM | POA: Diagnosis present

## 2013-02-02 DIAGNOSIS — E1129 Type 2 diabetes mellitus with other diabetic kidney complication: Secondary | ICD-10-CM | POA: Diagnosis present

## 2013-02-02 DIAGNOSIS — A419 Sepsis, unspecified organism: Principal | ICD-10-CM

## 2013-02-02 DIAGNOSIS — E876 Hypokalemia: Secondary | ICD-10-CM

## 2013-02-02 DIAGNOSIS — K219 Gastro-esophageal reflux disease without esophagitis: Secondary | ICD-10-CM | POA: Diagnosis present

## 2013-02-02 DIAGNOSIS — N183 Chronic kidney disease, stage 3 unspecified: Secondary | ICD-10-CM

## 2013-02-02 DIAGNOSIS — N289 Disorder of kidney and ureter, unspecified: Secondary | ICD-10-CM

## 2013-02-02 DIAGNOSIS — N170 Acute kidney failure with tubular necrosis: Secondary | ICD-10-CM | POA: Diagnosis present

## 2013-02-02 DIAGNOSIS — M109 Gout, unspecified: Secondary | ICD-10-CM | POA: Diagnosis present

## 2013-02-02 DIAGNOSIS — Z720 Tobacco use: Secondary | ICD-10-CM

## 2013-02-02 DIAGNOSIS — N61 Mastitis without abscess: Secondary | ICD-10-CM | POA: Diagnosis present

## 2013-02-02 DIAGNOSIS — N184 Chronic kidney disease, stage 4 (severe): Secondary | ICD-10-CM | POA: Diagnosis present

## 2013-02-02 DIAGNOSIS — F17200 Nicotine dependence, unspecified, uncomplicated: Secondary | ICD-10-CM | POA: Diagnosis present

## 2013-02-02 DIAGNOSIS — E6609 Other obesity due to excess calories: Secondary | ICD-10-CM | POA: Diagnosis present

## 2013-02-02 DIAGNOSIS — R112 Nausea with vomiting, unspecified: Secondary | ICD-10-CM

## 2013-02-02 DIAGNOSIS — L0390 Cellulitis, unspecified: Secondary | ICD-10-CM

## 2013-02-02 DIAGNOSIS — I129 Hypertensive chronic kidney disease with stage 1 through stage 4 chronic kidney disease, or unspecified chronic kidney disease: Secondary | ICD-10-CM | POA: Diagnosis present

## 2013-02-02 DIAGNOSIS — Z96649 Presence of unspecified artificial hip joint: Secondary | ICD-10-CM

## 2013-02-02 LAB — URINE MICROSCOPIC-ADD ON

## 2013-02-02 LAB — URINALYSIS, ROUTINE W REFLEX MICROSCOPIC
Glucose, UA: NEGATIVE mg/dL
Ketones, ur: 15 mg/dL — AB
Nitrite: NEGATIVE
Protein, ur: 100 mg/dL — AB
Specific Gravity, Urine: 1.035 — ABNORMAL HIGH (ref 1.005–1.030)
Urobilinogen, UA: 1 mg/dL (ref 0.0–1.0)
pH: 5 (ref 5.0–8.0)

## 2013-02-02 LAB — GLUCOSE, CAPILLARY
Glucose-Capillary: 212 mg/dL — ABNORMAL HIGH (ref 70–99)
Glucose-Capillary: 245 mg/dL — ABNORMAL HIGH (ref 70–99)

## 2013-02-02 LAB — CBC WITH DIFFERENTIAL/PLATELET
Basophils Absolute: 0 10*3/uL (ref 0.0–0.1)
Basophils Relative: 0 % (ref 0–1)
Eosinophils Absolute: 0 10*3/uL (ref 0.0–0.7)
Eosinophils Relative: 0 % (ref 0–5)
HCT: 33.9 % — ABNORMAL LOW (ref 36.0–46.0)
Hemoglobin: 11.4 g/dL — ABNORMAL LOW (ref 12.0–15.0)
Lymphocytes Relative: 4 % — ABNORMAL LOW (ref 12–46)
Lymphs Abs: 1 10*3/uL (ref 0.7–4.0)
MCH: 26.4 pg (ref 26.0–34.0)
MCHC: 33.6 g/dL (ref 30.0–36.0)
MCV: 78.5 fL (ref 78.0–100.0)
Monocytes Absolute: 1.2 10*3/uL — ABNORMAL HIGH (ref 0.1–1.0)
Monocytes Relative: 5 % (ref 3–12)
Neutro Abs: 21.1 10*3/uL — ABNORMAL HIGH (ref 1.7–7.7)
Neutrophils Relative %: 91 % — ABNORMAL HIGH (ref 43–77)
Platelets: 209 10*3/uL (ref 150–400)
RBC: 4.32 MIL/uL (ref 3.87–5.11)
RDW: 16.1 % — ABNORMAL HIGH (ref 11.5–15.5)
WBC: 23.3 10*3/uL — ABNORMAL HIGH (ref 4.0–10.5)

## 2013-02-02 LAB — COMPREHENSIVE METABOLIC PANEL
ALT: 41 U/L — ABNORMAL HIGH (ref 0–35)
AST: 45 U/L — ABNORMAL HIGH (ref 0–37)
Albumin: 2.5 g/dL — ABNORMAL LOW (ref 3.5–5.2)
Alkaline Phosphatase: 123 U/L — ABNORMAL HIGH (ref 39–117)
BUN: 28 mg/dL — ABNORMAL HIGH (ref 6–23)
CO2: 22 mEq/L (ref 19–32)
Calcium: 9.6 mg/dL (ref 8.4–10.5)
Chloride: 93 mEq/L — ABNORMAL LOW (ref 96–112)
Creatinine, Ser: 1.77 mg/dL — ABNORMAL HIGH (ref 0.50–1.10)
GFR calc Af Amer: 34 mL/min — ABNORMAL LOW (ref >90)
GFR calc non Af Amer: 30 mL/min — ABNORMAL LOW (ref >90)
Glucose, Bld: 231 mg/dL — ABNORMAL HIGH (ref 70–99)
Potassium: 2.9 mEq/L — ABNORMAL LOW (ref 3.5–5.1)
Sodium: 130 mEq/L — ABNORMAL LOW (ref 135–145)
Total Bilirubin: 1.2 mg/dL (ref 0.3–1.2)
Total Protein: 7 g/dL (ref 6.0–8.3)

## 2013-02-02 LAB — BASIC METABOLIC PANEL
BUN: 35 mg/dL — ABNORMAL HIGH (ref 6–23)
CO2: 20 mEq/L (ref 19–32)
Calcium: 8.7 mg/dL (ref 8.4–10.5)
Chloride: 97 mEq/L (ref 96–112)
Creatinine, Ser: 2.23 mg/dL — ABNORMAL HIGH (ref 0.50–1.10)
GFR calc Af Amer: 26 mL/min — ABNORMAL LOW (ref >90)
GFR calc non Af Amer: 22 mL/min — ABNORMAL LOW (ref >90)
Glucose, Bld: 291 mg/dL — ABNORMAL HIGH (ref 70–99)
Potassium: 3.8 mEq/L (ref 3.5–5.1)
Sodium: 130 mEq/L — ABNORMAL LOW (ref 135–145)

## 2013-02-02 LAB — LIPASE, BLOOD: Lipase: 17 U/L (ref 11–59)

## 2013-02-02 LAB — TROPONIN I: Troponin I: <0.3 ng/mL (ref <0.30)

## 2013-02-02 LAB — LACTIC ACID, PLASMA: Lactic Acid, Venous: 1.8 mmol/L (ref 0.5–2.2)

## 2013-02-02 LAB — MAGNESIUM: Magnesium: 1.5 mg/dL (ref 1.5–2.5)

## 2013-02-02 LAB — PHOSPHORUS: Phosphorus: 2.9 mg/dL (ref 2.3–4.6)

## 2013-02-02 MED ORDER — SODIUM CHLORIDE 0.9 % IV BOLUS (SEPSIS)
1000.0000 mL | Freq: Once | INTRAVENOUS | Status: AC
  Administered 2013-02-02: 1000 mL via INTRAVENOUS

## 2013-02-02 MED ORDER — SODIUM CHLORIDE 0.9 % IV SOLN
INTRAVENOUS | Status: DC
  Administered 2013-02-02 – 2013-02-04 (×3): via INTRAVENOUS
  Administered 2013-02-04: 125 mL/h via INTRAVENOUS
  Administered 2013-02-05 – 2013-02-16 (×15): via INTRAVENOUS

## 2013-02-02 MED ORDER — OXYCODONE-ACETAMINOPHEN 5-325 MG PO TABS
1.0000 | ORAL_TABLET | ORAL | Status: DC | PRN
  Administered 2013-02-03: 1 via ORAL
  Filled 2013-02-02 (×2): qty 1

## 2013-02-02 MED ORDER — ONDANSETRON HCL 4 MG/2ML IJ SOLN: 4.0000 mg | Freq: Four times a day (QID) | INTRAMUSCULAR | Status: DC | PRN

## 2013-02-02 MED ORDER — POTASSIUM CHLORIDE 20 MEQ/15ML (10%) PO LIQD: 40.0000 meq | ORAL | Status: DC

## 2013-02-02 MED ORDER — POTASSIUM CHLORIDE 10 MEQ/100ML IV SOLN
10.0000 meq | INTRAVENOUS | Status: AC
  Administered 2013-02-02 (×2): 10 meq via INTRAVENOUS
  Filled 2013-02-02 (×4): qty 100

## 2013-02-02 MED ORDER — SODIUM CHLORIDE 0.9 % IV SOLN: INTRAVENOUS | Status: DC

## 2013-02-02 MED ORDER — POTASSIUM CHLORIDE CRYS ER 20 MEQ PO TBCR
40.0000 meq | EXTENDED_RELEASE_TABLET | ORAL | Status: AC
  Administered 2013-02-02 (×2): 40 meq via ORAL
  Filled 2013-02-02 (×2): qty 2

## 2013-02-02 MED ORDER — ENOXAPARIN SODIUM 30 MG/0.3ML ~~LOC~~ SOLN
30.0000 mg | SUBCUTANEOUS | Status: DC
  Administered 2013-02-02: 30 mg via SUBCUTANEOUS
  Filled 2013-02-02 (×2): qty 0.3

## 2013-02-02 MED ORDER — FLUTICASONE PROPIONATE 50 MCG/ACT NA SUSP
2.0000 | NASAL | Status: DC | PRN
Start: 2013-02-02 — End: 2013-02-20
  Filled 2013-02-02: qty 16

## 2013-02-02 MED ORDER — VANCOMYCIN HCL 10 G IV SOLR
1750.0000 mg | INTRAVENOUS | Status: DC
  Filled 2013-02-02: qty 1750

## 2013-02-02 MED ORDER — HYDROMORPHONE HCL PF 1 MG/ML IJ SOLN
1.0000 mg | Freq: Once | INTRAMUSCULAR | Status: AC
  Administered 2013-02-02: 1 mg via INTRAVENOUS
  Filled 2013-02-02: qty 1

## 2013-02-02 MED ORDER — ALLOPURINOL 100 MG PO TABS
100.0000 mg | ORAL_TABLET | Freq: Every day | ORAL | Status: DC
  Administered 2013-02-03 – 2013-02-20 (×15): 100 mg via ORAL
  Filled 2013-02-02 (×20): qty 1

## 2013-02-02 MED ORDER — INSULIN ASPART PROT & ASPART (70-30 MIX) 100 UNIT/ML ~~LOC~~ SUSP
30.0000 [IU] | Freq: Two times a day (BID) | SUBCUTANEOUS | Status: DC
  Administered 2013-02-02 – 2013-02-03 (×2): 30 [IU] via SUBCUTANEOUS
  Filled 2013-02-02: qty 10

## 2013-02-02 MED ORDER — ONDANSETRON HCL 4 MG/2ML IJ SOLN
4.0000 mg | Freq: Once | INTRAMUSCULAR | Status: AC
  Administered 2013-02-02: 4 mg via INTRAVENOUS
  Filled 2013-02-02: qty 2

## 2013-02-02 MED ORDER — VANCOMYCIN HCL 10 G IV SOLR
2000.0000 mg | Freq: Once | INTRAVENOUS | Status: AC
  Administered 2013-02-02: 2000 mg via INTRAVENOUS
  Filled 2013-02-02: qty 2000

## 2013-02-02 MED ORDER — ALBUTEROL SULFATE HFA 108 (90 BASE) MCG/ACT IN AERS
2.0000 | INHALATION_SPRAY | Freq: Four times a day (QID) | RESPIRATORY_TRACT | Status: DC | PRN
  Filled 2013-02-02: qty 6.7

## 2013-02-02 MED ORDER — ADULT MULTIVITAMIN W/MINERALS CH
1.0000 | ORAL_TABLET | Freq: Every day | ORAL | Status: DC
  Administered 2013-02-02 – 2013-02-20 (×15): 1 via ORAL
  Filled 2013-02-02 (×19): qty 1

## 2013-02-02 MED ORDER — PANTOPRAZOLE SODIUM 40 MG PO TBEC
40.0000 mg | DELAYED_RELEASE_TABLET | Freq: Every day | ORAL | Status: DC
  Administered 2013-02-02 – 2013-02-20 (×16): 40 mg via ORAL
  Filled 2013-02-02 (×15): qty 1

## 2013-02-02 MED ORDER — MAGNESIUM SULFATE 40 MG/ML IJ SOLN
4.0000 g | Freq: Once | INTRAMUSCULAR | Status: AC
  Administered 2013-02-02: 4 g via INTRAVENOUS
  Filled 2013-02-02: qty 100

## 2013-02-02 MED ORDER — PIPERACILLIN-TAZOBACTAM 3.375 G IVPB
3.3750 g | Freq: Three times a day (TID) | INTRAVENOUS | Status: DC
  Administered 2013-02-02 – 2013-02-17 (×41): 3.375 g via INTRAVENOUS
  Filled 2013-02-02 (×48): qty 50

## 2013-02-02 MED ORDER — IOHEXOL 300 MG/ML  SOLN
80.0000 mL | Freq: Once | INTRAMUSCULAR | Status: AC | PRN
  Administered 2013-02-02: 80 mL via INTRAVENOUS

## 2013-02-02 MED ORDER — LEVOTHYROXINE SODIUM 150 MCG PO TABS
150.0000 ug | ORAL_TABLET | Freq: Every day | ORAL | Status: DC
  Administered 2013-02-02 – 2013-02-20 (×19): 150 ug via ORAL
  Filled 2013-02-02 (×26): qty 1

## 2013-02-02 MED ORDER — CILOSTAZOL 100 MG PO TABS
100.0000 mg | ORAL_TABLET | Freq: Two times a day (BID) | ORAL | Status: DC
  Administered 2013-02-02 – 2013-02-20 (×32): 100 mg via ORAL
  Filled 2013-02-02 (×37): qty 1

## 2013-02-02 MED ORDER — ACETAMINOPHEN 325 MG PO TABS
650.0000 mg | ORAL_TABLET | Freq: Four times a day (QID) | ORAL | Status: DC | PRN
  Administered 2013-02-09 – 2013-02-20 (×4): 650 mg via ORAL
  Filled 2013-02-02 (×5): qty 2

## 2013-02-02 MED ORDER — IOHEXOL 300 MG/ML  SOLN
25.0000 mL | INTRAMUSCULAR | Status: AC
  Administered 2013-02-02 (×2): 25 mL via ORAL

## 2013-02-02 MED ORDER — POTASSIUM CHLORIDE 10 MEQ/100ML IV SOLN
INTRAVENOUS | Status: AC
  Administered 2013-02-02: 10 meq
  Filled 2013-02-02: qty 200

## 2013-02-02 MED ORDER — HYDROMORPHONE HCL PF 1 MG/ML IJ SOLN
1.0000 mg | INTRAMUSCULAR | Status: DC | PRN
  Administered 2013-02-02: 1 mg via INTRAVENOUS
  Administered 2013-02-03: 0.5 mg via INTRAVENOUS
  Administered 2013-02-03: 1 mg via INTRAVENOUS
  Administered 2013-02-03: 0.5 mg via INTRAVENOUS
  Filled 2013-02-02 (×3): qty 1

## 2013-02-02 NOTE — Progress Notes (Signed)
Utilization review completed.  

## 2013-02-02 NOTE — Consult Note (Signed)
Consult: renal cyst, possible bladder stone on CT Requested by: Dr. Broadus John  History of Present Illness:  Pt admitted for malaise, diarhea, possible abscess/cellulitis perineum. On CT possible fluid collection/calcific densities noted in the pelvis possibly related to bladder. There were also several renal cysts and left adrenal nodularity. Looking at the CT, the area in the pelvis seems separate from bladder which is supported by her clear U/A and lack of hematuria or dysuria. There seems to be a fat plane between the bladder and this pelvic fluid/calcifications. She also told me she had a few "feet" of colon removed in the past.   She does have some stress incontinence and mild urge incontinence. She is on Vesicare.    Past Medical History  Diagnosis Date  . History of hip replacement, total   . Hx MRSA infection     abscess left groin  . Arthritis   . GERD (gastroesophageal reflux disease)   . Gout   . Diabetes mellitus   . Hypertension   . Thyroid disease    Past Surgical History  Procedure Laterality Date  . Thyroidectomy    . Abdominal hysterectomy    . Ventral hernia repair    . Laparoscopic cholecystectomy    . Forearm fracture surgery      Left arm  . Joint replacement    . Hernia repair      w/ mesh    Home Medications:  Prescriptions prior to admission  Medication Sig Dispense Refill  . albuterol (PROVENTIL HFA;VENTOLIN HFA) 108 (90 BASE) MCG/ACT inhaler Inhale 2 puffs into the lungs every 6 (six) hours as needed for wheezing or shortness of breath.      . allopurinol (ZYLOPRIM) 100 MG tablet Take 100 mg by mouth daily.        Marland Kitchen amLODipine (NORVASC) 10 MG tablet Take 10 mg by mouth daily.        . B Complex-C (B-COMPLEX WITH VITAMIN C) tablet Take 1 tablet by mouth 2 (two) times daily.      . cilostazol (PLETAL) 100 MG tablet take 1 tablet by mouth twice a day  60 tablet  11  . colchicine 0.6 MG tablet Take 0.6 mg by mouth 2 (two) times daily.        . diclofenac  sodium (VOLTAREN) 1 % GEL Apply 2 g topically 2 (two) times daily as needed (for pain).      Marland Kitchen esomeprazole (NEXIUM) 40 MG capsule Take 40 mg by mouth daily before breakfast.        . fluticasone (FLONASE) 50 MCG/ACT nasal spray Place 2 sprays into the nose as needed for allergies.       . hydrochlorothiazide (,MICROZIDE/HYDRODIURIL,) 12.5 MG capsule Take 12.5 mg by mouth daily.        . Insulin Aspart Prot & Aspart (NOVOLOG MIX 70/30 FLEXPEN Carlos) Inject 30-40 Units into the skin 2 (two) times daily with a meal. take 40 units in the morning and 30 units in the evening      . insulin aspart protamine-insulin aspart (NOVOLOG 70/30) (70-30) 100 UNIT/ML injection Inject into the skin 2 (two) times daily with a meal.        . levothyroxine (SYNTHROID, LEVOTHROID) 150 MCG tablet Take 150 mcg by mouth daily.        . meloxicam (MOBIC) 15 MG tablet Take 15 mg by mouth daily.        . Multiple Vitamin (MULTIVITAMIN WITH MINERALS) TABS Take 1 tablet by mouth  daily.      . oxyCODONE-acetaminophen (PERCOCET/ROXICET) 5-325 MG per tablet Take 1 tablet by mouth every 4 (four) hours as needed for pain.      Marland Kitchen solifenacin (VESICARE) 5 MG tablet Take 5 mg by mouth daily.        Allergies:  Allergies  Allergen Reactions  . Ace Inhibitors     Tongue swell  . Morphine And Related Itching    Family History  Problem Relation Age of Onset  . Diabetes Brother    Social History:  reports that she has been smoking Cigarettes.  She has a 10 pack-year smoking history. She does not have any smokeless tobacco history on file. She reports that she does not drink alcohol or use illicit drugs.  ROS: A complete review of systems was performed.  All systems are negative except for pertinent findings as noted. @ROS @   Physical Exam:  Vital signs in last 24 hours: Temp:  [98.1 F (36.7 C)-99.8 F (37.7 C)] 98.1 F (36.7 C) (04/28 2108) Pulse Rate:  [122-134] 122 (04/28 2108) Resp:  [13-30] 30 (04/28 2108) BP:  (73-122)/(37-64) 118/50 mmHg (04/28 2108) SpO2:  [93 %-100 %] 93 % (04/28 2108) Weight:  [133.811 kg (295 lb)-134.7 kg (296 lb 15.4 oz)] 134.7 kg (296 lb 15.4 oz) (04/28 1629) General:  Alert and oriented, No acute distress HEENT: Normocephalic, atraumatic Neck: No JVD or lymphadenopathy Cardiovascular: Regular rate and rhythm Lungs: Regular rate and effort Abdomen: Soft, nontender, nondistended, no abdominal masses Back: No CVA tenderness Extremities: No edema Neurologic: Grossly intact  Laboratory Data:  Results for orders placed during the hospital encounter of 02/02/13 (from the past 24 hour(s))  GLUCOSE, CAPILLARY     Status: Abnormal   Collection Time    02/02/13  8:20 AM      Result Value Range   Glucose-Capillary 212 (*) 70 - 99 mg/dL  CBC WITH DIFFERENTIAL     Status: Abnormal   Collection Time    02/02/13  8:50 AM      Result Value Range   WBC 23.3 (*) 4.0 - 10.5 K/uL   RBC 4.32  3.87 - 5.11 MIL/uL   Hemoglobin 11.4 (*) 12.0 - 15.0 g/dL   HCT 33.9 (*) 36.0 - 46.0 %   MCV 78.5  78.0 - 100.0 fL   MCH 26.4  26.0 - 34.0 pg   MCHC 33.6  30.0 - 36.0 g/dL   RDW 16.1 (*) 11.5 - 15.5 %   Platelets 209  150 - 400 K/uL   Neutrophils Relative 91 (*) 43 - 77 %   Neutro Abs 21.1 (*) 1.7 - 7.7 K/uL   Lymphocytes Relative 4 (*) 12 - 46 %   Lymphs Abs 1.0  0.7 - 4.0 K/uL   Monocytes Relative 5  3 - 12 %   Monocytes Absolute 1.2 (*) 0.1 - 1.0 K/uL   Eosinophils Relative 0  0 - 5 %   Eosinophils Absolute 0.0  0.0 - 0.7 K/uL   Basophils Relative 0  0 - 1 %   Basophils Absolute 0.0  0.0 - 0.1 K/uL  COMPREHENSIVE METABOLIC PANEL     Status: Abnormal   Collection Time    02/02/13  8:50 AM      Result Value Range   Sodium 130 (*) 135 - 145 mEq/L   Potassium 2.9 (*) 3.5 - 5.1 mEq/L   Chloride 93 (*) 96 - 112 mEq/L   CO2 22  19 - 32 mEq/L  Glucose, Bld 231 (*) 70 - 99 mg/dL   BUN 28 (*) 6 - 23 mg/dL   Creatinine, Ser 1.77 (*) 0.50 - 1.10 mg/dL   Calcium 9.6  8.4 - 10.5 mg/dL    Total Protein 7.0  6.0 - 8.3 g/dL   Albumin 2.5 (*) 3.5 - 5.2 g/dL   AST 45 (*) 0 - 37 U/L   ALT 41 (*) 0 - 35 U/L   Alkaline Phosphatase 123 (*) 39 - 117 U/L   Total Bilirubin 1.2  0.3 - 1.2 mg/dL   GFR calc non Af Amer 30 (*) >90 mL/min   GFR calc Af Amer 34 (*) >90 mL/min  LIPASE, BLOOD     Status: None   Collection Time    02/02/13  8:50 AM      Result Value Range   Lipase 17  11 - 59 U/L  TROPONIN I     Status: None   Collection Time    02/02/13  8:50 AM      Result Value Range   Troponin I <0.30  <0.30 ng/mL  LACTIC ACID, PLASMA     Status: None   Collection Time    02/02/13 10:31 AM      Result Value Range   Lactic Acid, Venous 1.8  0.5 - 2.2 mmol/L  URINALYSIS, ROUTINE W REFLEX MICROSCOPIC     Status: Abnormal   Collection Time    02/02/13 12:23 PM      Result Value Range   Color, Urine AMBER (*) YELLOW   APPearance TURBID (*) CLEAR   Specific Gravity, Urine 1.035 (*) 1.005 - 1.030   pH 5.0  5.0 - 8.0   Glucose, UA NEGATIVE  NEGATIVE mg/dL   Hgb urine dipstick SMALL (*) NEGATIVE   Bilirubin Urine MODERATE (*) NEGATIVE   Ketones, ur 15 (*) NEGATIVE mg/dL   Protein, ur 100 (*) NEGATIVE mg/dL   Urobilinogen, UA 1.0  0.0 - 1.0 mg/dL   Nitrite NEGATIVE  NEGATIVE   Leukocytes, UA TRACE (*) NEGATIVE  URINE MICROSCOPIC-ADD ON     Status: Abnormal   Collection Time    02/02/13 12:23 PM      Result Value Range   Squamous Epithelial / LPF FEW (*) RARE   WBC, UA 0-2  <3 WBC/hpf   RBC / HPF 0-2  <3 RBC/hpf   Bacteria, UA FEW (*) RARE   Casts GRANULAR CAST (*) NEGATIVE   Urine-Other AMORPHOUS URATES/PHOSPHATES    MAGNESIUM     Status: None   Collection Time    02/02/13  2:47 PM      Result Value Range   Magnesium 1.5  1.5 - 2.5 mg/dL  PHOSPHORUS     Status: None   Collection Time    02/02/13  2:47 PM      Result Value Range   Phosphorus 2.9  2.3 - 4.6 mg/dL  GLUCOSE, CAPILLARY     Status: Abnormal   Collection Time    02/02/13  5:24 PM      Result Value Range    Glucose-Capillary 245 (*) 70 - 99 mg/dL   No results found for this or any previous visit (from the past 240 hour(s)). Creatinine:  Recent Labs  02/02/13 0850  CREATININE 1.77*    Impression/Assessment:  Patient Active Problem List   Diagnosis Date Noted  . Sepsis 02/02/2013  . DM (diabetes mellitus) 02/02/2013  . CKD (chronic kidney disease), stage III 02/02/2013  . Cellulitis 02/02/2013  .  PAD (peripheral artery disease) 04/26/2011  . Tobacco abuse 04/26/2011  Mixed incontinence  I would be concerned the pelvic fluid/calcifications are a bowel process given her diarrhea and peri-rectal pain. I would consider having Gen Surgery evaluate especially if there is concern for per-rectal/perineal infection.    Plan:  -F/u outpatient for cystoscopy to evaluate bladder and incontinence.  -Renal cysts/adrenal findings will need surveillance imaging in future and MRI is a good option as mentioned in the report.  -I gave patient my card/contact information for outpatient follow-  Cc: Dr. Burnett Sheng  Fredricka Bonine 02/02/2013, 9:17 PM

## 2013-02-02 NOTE — ED Notes (Signed)
CT notified that the pt finished her first cup of contrast.

## 2013-02-02 NOTE — H&P (Addendum)
[facility] History and Physical  [name] [name] [id number] DOB: Mar 08, 1951 DOA: 02/02/2013  Referring physician: EDP PCP: [name], MD    Chief Complaint: diarrhea and gluteal boil  HPI: [name] [name] is a 62 y.o. female with PMH of DM, Obesity, HTN, CKD 3, presents from home today with the above complaint. She c/o diarrhea since Friday, it is non bloodly and non bilious, associated with lower abdominal cramps, she had 2 episodes today thus far, h/o recent antibiotic use was 2-3 weeks ago for UTI. In addition she also noted a boil in her L buttock /perineal region 1 week ago, this has progressively increased in size and is very painful, she denies any drainage from it. She denies any fevers, reports chills. Upon evaluation in the ER she was noted to be tachycardic with HR of 130s, BP as low as 73/49 which improved with IVF to low 100s, WBC of 23K and electrolyte abormalities    Review of Systems:  The patient denies anorexia, fever, weight loss,, vision loss, decreased hearing, hoarseness, chest pain, syncope, dyspnea on exertion, peripheral edema, balance deficits, hemoptysis, melena, hematochezia, severe indigestion/heartburn, hematuria, incontinence, genital sores, muscle weakness, suspicious skin lesions, transient blindness, difficulty walking, depression, unusual weight change, abnormal bleeding, enlarged lymph nodes, angioedema, and breast masses.    Past Medical History  Diagnosis Date  . History of hip replacement, total   . Hx MRSA infection     abscess left groin  . Arthritis   . GERD (gastroesophageal reflux disease)   . Gout   . Diabetes mellitus   . Hypertension   . Thyroid disease    Past Surgical History  Procedure Laterality Date  . Thyroidectomy    . Abdominal hysterectomy    . Ventral hernia repair    . Laparoscopic cholecystectomy    . Forearm fracture surgery      Left arm  . Joint replacement    . Hernia repair      w/ mesh    Social History:  reports that she has been smoking Cigarettes.  She has a 10 pack-year smoking history. She does not have any smokeless tobacco history on file. She reports that she does not drink alcohol or use illicit drugs. Lives at home with nephew  Allergies  Allergen Reactions  . Ace Inhibitors     Tongue swell  . Morphine And Related Itching    Family History  Problem Relation Age of Onset  . Diabetes Brother    Prior to Admission medications   Medication Sig Start Date End Date Taking? Authorizing Provider  albuterol (PROVENTIL HFA;VENTOLIN HFA) 108 (90 BASE) MCG/ACT inhaler Inhale 2 puffs into the lungs every 6 (six) hours as needed for wheezing or shortness of breath.   Yes Historical Provider, MD  allopurinol (ZYLOPRIM) 100 MG tablet Take 100 mg by mouth daily.     Yes Historical Provider, MD  amLODipine (NORVASC) 10 MG tablet Take 10 mg by mouth daily.     Yes Historical Provider, MD  B Complex-C (B-COMPLEX WITH VITAMIN C) tablet Take 1 tablet by mouth 2 (two) times daily.   Yes Historical Provider, MD  cilostazol (PLETAL) 100 MG tablet take 1 tablet by mouth twice a day 08/01/12  Yes Conrad [place], MD  colchicine 0.6 MG tablet Take 0.6 mg by mouth 2 (two) times daily.     Yes Historical Provider, MD  diclofenac sodium (VOLTAREN) 1 % GEL Apply 2 g topically 2 (two) times daily as needed (for  pain).   Yes Historical Provider, MD  esomeprazole (NEXIUM) 40 MG capsule Take 40 mg by mouth daily before breakfast.     Yes Historical Provider, MD  fluticasone (FLONASE) 50 MCG/ACT nasal spray Place 2 sprays into the nose as needed for allergies.    Yes Historical Provider, MD  hydrochlorothiazide (,MICROZIDE/HYDRODIURIL,) 12.5 MG capsule Take 12.5 mg by mouth daily.     Yes Historical Provider, MD  Insulin Aspart Prot & Aspart (NOVOLOG MIX 70/30 FLEXPEN ) Inject 30-40 Units into the skin 2 (two) times daily with a meal. take 40 units in the morning and 30 units in the evening   Yes  Historical Provider, MD  insulin aspart protamine-insulin aspart (NOVOLOG 70/30) (70-30) 100 UNIT/ML injection Inject into the skin 2 (two) times daily with a meal.     Yes Historical Provider, MD  levothyroxine (SYNTHROID, LEVOTHROID) 150 MCG tablet Take 150 mcg by mouth daily.     Yes Historical Provider, MD  meloxicam (MOBIC) 15 MG tablet Take 15 mg by mouth daily.     Yes Historical Provider, MD  Multiple Vitamin (MULTIVITAMIN WITH MINERALS) TABS Take 1 tablet by mouth daily.   Yes Historical Provider, MD  oxyCODONE-acetaminophen (PERCOCET/ROXICET) 5-325 MG per tablet Take 1 tablet by mouth every 4 (four) hours as needed for pain.   Yes Historical Provider, MD  solifenacin (VESICARE) 5 MG tablet Take 5 mg by mouth daily.    Yes Historical Provider, MD   Physical Exam: Filed Vitals:   02/02/13 1330 02/02/13 1400 02/02/13 1430 02/02/13 1530  BP: 90/49 108/45 73/49 109/44  Pulse: 123  130   Temp:      TempSrc:      Resp: 27 22 24    Height:      Weight:      SpO2: 95%  96%      General:  AAOx3  HEENT: PERRLA, EOMI  Cardiovascular: S1S2/RRR  Respiratory: CTAB  Abdomen: soft, obese, minimal tenderness in periumbilical region on L, BS present, no rigidity or rebound  Ext: no edema c/c  Perineum: In L medial gluteal and perineal region, 10/12cm indurated mass, inflamed and tender to palpation, no fluctuation noted, no skin breakdown  Psychiatric: appropriate mood and affect  Neurologic: non focal  Labs on Admission:  Basic Metabolic Panel:  Recent Labs Lab 02/02/13 0850  NA 130*  K 2.9*  CL 93*  CO2 22  GLUCOSE 231*  BUN 28*  CREATININE 1.77*  CALCIUM 9.6   Liver Function Tests:  Recent Labs Lab 02/02/13 0850  AST 45*  ALT 41*  ALKPHOS 123*  BILITOT 1.2  PROT 7.0  ALBUMIN 2.5*    Recent Labs Lab 02/02/13 0850  LIPASE 17   No results found for this basename: AMMONIA,  in the last 168 hours CBC:  Recent Labs Lab 02/02/13 0850  WBC 23.3*   NEUTROABS 21.1*  HGB 11.4*  HCT 33.9*  MCV 78.5  PLT 209   Cardiac Enzymes:  Recent Labs Lab 02/02/13 0850  TROPONINI <0.30    BNP (last 3 results) No results found for this basename: PROBNP,  in the last 8760 hours CBG:  Recent Labs Lab 02/02/13 0820  GLUCAP 212*    Radiological Exams on Admission: Dg Chest 2 View  02/02/2013  *RADIOLOGY REPORT*  Clinical Data: 62 year old female with shortness of breath cough low grade fever diarrhea.  CHEST - 2 VIEW  Comparison: 07/31/2011 and earlier.  Findings: Semi upright AP and lateral views of the chest.  Cardiomegaly not significantly changed. Other mediastinal contours are within normal limits.  Visualized tracheal air column is within normal limits.  No pneumothorax or pulmonary edema.  No pleural effusion or consolidation.  No confluent pulmonary opacity. No acute osseous abnormality identified.  IMPRESSION: Stable cardiomegaly.  No acute cardiopulmonary abnormality.   Original Report Authenticated By: Roselyn Reef, M.D.    Ct Abdomen Pelvis W Contrast  02/02/2013  *RADIOLOGY REPORT*  Clinical Data: Abdominal pain and diarrhea.  CT ABDOMEN AND PELVIS WITH CONTRAST  Technique:  Multidetector CT imaging of the abdomen and pelvis was performed following the standard protocol during bolus administration of intravenous contrast.  Contrast: 34mL OMNIPAQUE IOHEXOL 300 MG/ML  SOLN  Comparison: None.  Findings: Lung bases are clear.  No evidence for free intraperitoneal air.  No gross abnormality to the liver, portal venous system or spleen. The gallbladder appears to be absent.  No gross abnormality to the pancreas.  There is a diffuse thickening and nodularity in the left adrenal gland.  Lateral left adrenal nodule measures up to 2.7 cm.  There is perinephric edema bilaterally, right side greater than left.  There is an exophytic hyperdense structure along the posterior lower pole of the right kidney that measures 1.4 cm. There is an adjacent low  density structure that measures 2.3 cm. Indeterminate 1.4 cm low density structure along the medial right kidney.  Suspect small cysts in the left kidney.  The patient has a right hip replacement that causes artifact in the pelvis.  However, there is a large amount of high density material along the posterior aspect of the bladder which is concerning for multiple stones or calcifications.  No significant free fluid or lymphadenopathy in the abdomen or pelvis.  There is a small ventral hernia containing fat.  No significant colon wall thickening.  No acute bony abnormality. There is subcutaneous edema in the left hemi pelvis.  There is no significant excretion from the kidneys on the delayed images.  The patient is noted to have creatinine level 1.77.  IMPRESSION: There is high density material along the posterior aspect of the bladder.  Differential diagnosis includes multiple bladder stones versus a calcified lesion.  Recommend urology consultation.  No excretion from the kidneys on the delayed images.  This finding along with the elevated creatinine level raises concern for renal function impairment and recommend following renal function to exclude worsening renal function or ATN.  There are multiple low-density structures involving both kidneys that could represent cysts.  There is an exophytic hyperdense structure in the right kidney lower pole which could represent a complex or hemorrhagic cyst.  However, these renal structures are indeterminate.  There is also nodularity and enlargement of the left adrenal gland which could represent hyperplasia or adenomas but indeterminate on this postcontrast examination.  The adrenal and renal findings could be more definitively evaluated with MRI.   Original Report Authenticated By: Markus Daft, M.D.      Assessment/Plan Active Problems:   Sepsis   DM (diabetes mellitus)   CKD (chronic kidney disease), stage III   Cellulitis   1. Sepsis - Source likely  cellulitis vs abscess in perineal/gluteal region - I am not able to palpate a fluctuant area to suspect an abscess at this time - check USG, if abscess noted will need I&D,  - Empiric IV Vanc and Zosyn - IVF - FU Blood CX -lactic acid normal, vitals improved with Abx and IVF -also check CDIFF PCR given diarrhea  2.  Diarrhea: may be sepsis related -check C diff PCR -CT abd pelvis noted - Diet as tolerated  3.  DM:  - resume Insulin 70/30, SSI -check HBaic  4. ARF on  CKD: Last creatinine 1.2-1.3 from 10/12 -hold ACE -IVF -Follow Output and BMET  5. Abnormal Renal/Bladder findings -unclear clinical significance  -Urology input requested  6. H/o PVD: continue pletal  Code Status: FULL Family Communication: none at bedside Disposition Plan: SDU  Time spent: 47min  Shonteria Abeln [facility] Pager [phone]  If 7PM-7AM, please contact night-coverage [email or web address] Password [email or web address] 02/02/2013, 3:46 PM

## 2013-02-02 NOTE — ED Notes (Signed)
Pt is here with abdominal pain all over and diarrhea for three days associated with nausea and denies vomiting.  Pt reports abscess to buttock area.  Pt is diabetic and has not had insulin today.  Pt states blood sugars have been in the 270s

## 2013-02-02 NOTE — ED Notes (Signed)
This RN placed the pt on 2L of O2 because the pt desated when pt coughs, and the percentage would not increase after asked to take a deep breath.

## 2013-02-02 NOTE — ED Notes (Signed)
Patient is unable to get uine at this time will try to get urine later

## 2013-02-02 NOTE — ED Provider Notes (Signed)
History     CSN: [id number]  Arrival date & time 02/02/13  0808   First MD Initiated Contact with Patient 02/02/13 [phone]      Chief Complaint  Patient presents with  . Abdominal Pain  . Abscess  . Diarrhea    (Consider location/radiation/quality/duration/timing/severity/associated sxs/prior treatment) HPI Comments: Pt with a PMH significant for HTN, DM, Arthritis, Gout, GERD, presents to the ED for generalized, crampy abdominal pain and profuse diarrhea x3 days associated with nausea but denies any episodes of vomiting.  Pt states diarrhea has been non-bloody and watery.  Has tried to eat and drink as normal but states everything "runs right through me".  She feels like this is making her weak.  Abx use a few weeks ago for UTI. Denies any recent fever, chills, sweats, or sick contacts.  Also endorses some urinary stress incontinence, notably when coughing, and some increase of urinary frequency- pt takes HCTZ.  Denies any dysuria, hematuria, or vaginal discharge.  Patient also notes she has a large boil on her left buttock. She has been keeping it bandaged, however it has become larger and increasingly TTP.  Denies any drainage from the area.  The history is provided by the patient.    Past Medical History  Diagnosis Date  . History of hip replacement, total   . Hx MRSA infection     abscess left groin  . Arthritis   . GERD (gastroesophageal reflux disease)   . Gout   . Diabetes mellitus   . Hypertension   . Thyroid disease     Past Surgical History  Procedure Laterality Date  . Thyroidectomy    . Abdominal hysterectomy    . Ventral hernia repair    . Laparoscopic cholecystectomy    . Forearm fracture surgery      Left arm  . Joint replacement    . Hernia repair      w/ mesh    Family History  Problem Relation Age of Onset  . Diabetes Brother     History  Substance Use Topics  . Smoking status: Current Every Day Smoker -- 0.25 packs/day for 40 years    Types:  Cigarettes  . Smokeless tobacco: Not on file  . Alcohol Use: No    OB History   Grav Para Term Preterm Abortions TAB SAB Ect Mult Living                  Review of Systems  Gastrointestinal: Positive for nausea, abdominal pain and diarrhea.  Skin:       abscess  All other systems reviewed and are negative.    Allergies  Ace inhibitors and Morphine and related  Home Medications   Current Outpatient Rx  Name  Route  Sig  Dispense  Refill  . ALBUTEROL IN   Inhalation   Inhale into the lungs as needed.           Marland Kitchen allopurinol (ZYLOPRIM) 100 MG tablet   Oral   Take 100 mg by mouth daily.           Marland Kitchen amLODipine (NORVASC) 10 MG tablet   Oral   Take 10 mg by mouth daily.           . cilostazol (PLETAL) 100 MG tablet      take 1 tablet by mouth twice a day   60 tablet   11   . colchicine 0.6 MG tablet   Oral   Take 0.6 mg by  mouth 2 (two) times daily.           Marland Kitchen esomeprazole (NEXIUM) 40 MG capsule   Oral   Take 40 mg by mouth daily before breakfast.           . fluticasone (FLONASE) 50 MCG/ACT nasal spray   Nasal   Place 2 sprays into the nose as needed.           . hydrochlorothiazide (,MICROZIDE/HYDRODIURIL,) 12.5 MG capsule   Oral   Take 12.5 mg by mouth daily.           . insulin aspart protamine-insulin aspart (NOVOLOG 70/30) (70-30) 100 UNIT/ML injection   Subcutaneous   Inject into the skin 2 (two) times daily with a meal.           . levothyroxine (SYNTHROID, LEVOTHROID) 150 MCG tablet   Oral   Take 150 mcg by mouth daily.           . meloxicam (MOBIC) 15 MG tablet   Oral   Take 15 mg by mouth daily.           . solifenacin (VESICARE) 5 MG tablet   Oral   Take 10 mg by mouth daily.             BP 122/64  Pulse 134  Temp(Src) 98.2 F (36.8 C) (Oral)  Resp 22  SpO2 100%  Physical Exam  Nursing note and vitals reviewed. Constitutional: She is oriented to person, place, and time. She appears ill.  Morbidly obese   HENT:  Head: Normocephalic and atraumatic.  Mouth/Throat: Oropharynx is clear and moist.  Eyes: Conjunctivae and EOM are normal. Pupils are equal, round, and reactive to light.  Neck: Normal range of motion. Neck supple.  Cardiovascular: Regular rhythm and normal heart sounds.  Tachycardia present.   Pulmonary/Chest: Tachypnea noted. No respiratory distress. She has rhonchi in the right lower field and the left lower field.  Abdominal: Soft. Bowel sounds are normal. There is generalized tenderness. There is no CVA tenderness, no tenderness at McBurney's point and negative Murphy's sign.  Genitourinary:     Musculoskeletal: Normal range of motion.  Neurological: She is alert and oriented to person, place, and time.  Skin: Skin is warm and dry.  Psychiatric: She has a normal mood and affect.    ED Course  Procedures (including critical care time)   Date: 02/02/2013  Rate: 129  Rhythm: sinus tachycardia  QRS Axis: normal  Intervals: normal  ST/T Wave abnormalities: normal  Conduction Disutrbances:none  Narrative Interpretation: sinus tach, LVH, occasional PVC's, no STEMI  Old EKG Reviewed: unchanged    Labs Reviewed  CBC WITH DIFFERENTIAL - Abnormal; Notable for the following:    WBC 23.3 (*)    Hemoglobin 11.4 (*)    HCT 33.9 (*)    RDW 16.1 (*)    Neutrophils Relative 91 (*)    Neutro Abs 21.1 (*)    Lymphocytes Relative 4 (*)    Monocytes Absolute 1.2 (*)    All other components within normal limits  COMPREHENSIVE METABOLIC PANEL - Abnormal; Notable for the following:    Sodium 130 (*)    Potassium 2.9 (*)    Chloride 93 (*)    Glucose, Bld 231 (*)    BUN 28 (*)    Creatinine, Ser 1.77 (*)    Albumin 2.5 (*)    AST 45 (*)    ALT 41 (*)    Alkaline Phosphatase 123 (*)  GFR calc non Af Amer 30 (*)    GFR calc Af Amer 34 (*)    All other components within normal limits  GLUCOSE, CAPILLARY - Abnormal; Notable for the following:    Glucose-Capillary 212 (*)      All other components within normal limits  CULTURE, BLOOD (ROUTINE X 2)  CULTURE, BLOOD (ROUTINE X 2)  CLOSTRIDIUM DIFFICILE BY PCR  LIPASE, BLOOD  TROPONIN I  LACTIC ACID, PLASMA  URINALYSIS, ROUTINE W REFLEX MICROSCOPIC   Dg Chest 2 View  02/02/2013  *RADIOLOGY REPORT*  Clinical Data: 62 year old female with shortness of breath cough low grade fever diarrhea.  CHEST - 2 VIEW  Comparison: 07/31/2011 and earlier.  Findings: Semi upright AP and lateral views of the chest. Cardiomegaly not significantly changed. Other mediastinal contours are within normal limits.  Visualized tracheal air column is within normal limits.  No pneumothorax or pulmonary edema.  No pleural effusion or consolidation.  No confluent pulmonary opacity. No acute osseous abnormality identified.  IMPRESSION: Stable cardiomegaly.  No acute cardiopulmonary abnormality.   Original Report Authenticated By: Roselyn Reef, M.D.    Ct Abdomen Pelvis W Contrast  02/02/2013  *RADIOLOGY REPORT*  Clinical Data: Abdominal pain and diarrhea.  CT ABDOMEN AND PELVIS WITH CONTRAST  Technique:  Multidetector CT imaging of the abdomen and pelvis was performed following the standard protocol during bolus administration of intravenous contrast.  Contrast: 29mL OMNIPAQUE IOHEXOL 300 MG/ML  SOLN  Comparison: None.  Findings: Lung bases are clear.  No evidence for free intraperitoneal air.  No gross abnormality to the liver, portal venous system or spleen. The gallbladder appears to be absent.  No gross abnormality to the pancreas.  There is a diffuse thickening and nodularity in the left adrenal gland.  Lateral left adrenal nodule measures up to 2.7 cm.  There is perinephric edema bilaterally, right side greater than left.  There is an exophytic hyperdense structure along the posterior lower pole of the right kidney that measures 1.4 cm. There is an adjacent low density structure that measures 2.3 cm. Indeterminate 1.4 cm low density structure along the  medial right kidney.  Suspect small cysts in the left kidney.  The patient has a right hip replacement that causes artifact in the pelvis.  However, there is a large amount of high density material along the posterior aspect of the bladder which is concerning for multiple stones or calcifications.  No significant free fluid or lymphadenopathy in the abdomen or pelvis.  There is a small ventral hernia containing fat.  No significant colon wall thickening.  No acute bony abnormality. There is subcutaneous edema in the left hemi pelvis.  There is no significant excretion from the kidneys on the delayed images.  The patient is noted to have creatinine level 1.77.  IMPRESSION: There is high density material along the posterior aspect of the bladder.  Differential diagnosis includes multiple bladder stones versus a calcified lesion.  Recommend urology consultation.  No excretion from the kidneys on the delayed images.  This finding along with the elevated creatinine level raises concern for renal function impairment and recommend following renal function to exclude worsening renal function or ATN.  There are multiple low-density structures involving both kidneys that could represent cysts.  There is an exophytic hyperdense structure in the right kidney lower pole which could represent a complex or hemorrhagic cyst.  However, these renal structures are indeterminate.  There is also nodularity and enlargement of the left adrenal gland which  could represent hyperplasia or adenomas but indeterminate on this postcontrast examination.  The adrenal and renal findings could be more definitively evaluated with MRI.   Original Report Authenticated By: Markus Daft, M.D.      1. Nausea vomiting and diarrhea   2. Renal impairment   3. Cellulitis   4. Leukocytosis   5. Hypokalemia       MDM   Pt presenting to the ED for profuse diarrhea and generalized abdominal pain x 3 days.  Also significant cellulitis of left buttock.  Pt tachycardic, mildly distressed and ill appearing.  IVF bolus infusing. Initiated sepsis work-up. Labs as above.  Significant Leukocytosis at 23.3- possibly due to GI complaints vs cellulitis.  IV vanc started in the ED. Incidental renal findings on CT abd/pelvis and renal impairment noted- consulted Dr. Junious Silk who will review CT and labs.  Hospitalist consulted, pt will be admitted- [facility], step down, Dr. Ardis Hughs to receive.  Temp admit orders placed.           Larene Pickett, PA-C 02/03/13 1329

## 2013-02-02 NOTE — Progress Notes (Signed)
Pt admitted from ED at 1637. Vital signs are stable. Left buttock is red and very tender to touch but no wound or open area is noted. Will continue to monitor.

## 2013-02-02 NOTE — Progress Notes (Addendum)
ANTIBIOTIC CONSULT NOTE - INITIAL  Pharmacy Consult for Vancomycin, Potassium Indication: cellulitis, hypokalemia  Allergies  Allergen Reactions  . Ace Inhibitors     Tongue swell  . Morphine And Related Itching    Patient Measurements: Height: 5\' 4"  (162.6 cm) Weight: 295 lb (133.811 kg) IBW/kg (Calculated) : 54.7  Vital Signs: Temp: 99.8 F (37.7 C) (04/28 0950) Temp src: Oral (04/28 0950) BP: 112/43 mmHg (04/28 1105) Pulse Rate: 123 (04/28 1105) Intake/Output from previous day:   Intake/Output from this shift:    Labs:  Recent Labs  02/02/13 0850  WBC 23.3*  HGB 11.4*  PLT 209  CREATININE 1.77*   The CrCl is unknown because both a height and weight (above a minimum accepted value) are required for this calculation. No results found for this basename: VANCOTROUGH, VANCOPEAK, VANCORANDOM, GENTTROUGH, GENTPEAK, GENTRANDOM, TOBRATROUGH, TOBRAPEAK, TOBRARND, AMIKACINPEAK, AMIKACINTROU, AMIKACIN,  in the last 72 hours   Microbiology: No results found for this or any previous visit (from the past 720 hour(s)).  Medical History: Past Medical History  Diagnosis Date  . History of hip replacement, total   . Hx MRSA infection     abscess left groin  . Arthritis   . GERD (gastroesophageal reflux disease)   . Gout   . Diabetes mellitus   . Hypertension   . Thyroid disease     Medications:  Albuterol, allopurinol, amlodipine, b-complex, pletal, colchicine, volatren gel, nexium, flonase, hctz, novolog 70/30, synthroid, mobic, mvi, percocet, vesicare  Assessment: [name] [name] is a 62 yo admitted with abdominal pain, diarrhea and buttock abscess. Noted WBC is elevated, patient is afebrile. SCr elevated, appears to be ARF compared to historical Scr.   Noted serum K of 2.9 without EKG changes. This maybe attributable to the diarrhea that the patient has been having, but doubt this is the only reason since HCO3 is nml. No baseline Mg available.  Vanc 4/28>>  4/28:  Blood: 4/28: Urine: 4/28: Cdiff:  Goal of Therapy:  Vancomycin trough level 15-20 mcg/ml  Plan:  - Vancomycin 2gm IV now, then 1750mg  IV q 24h - Will monitor cx/spec/sens, renal fn and clinical status daily. - Will plan to check vanc trough at [facility] d/t using obesity nomogram for dosing. - Will order 3 runs of KCL and 40 Meq PO KCL x 2 doses. - Will check Mg now - Will recheck K at 8pm today to assess need for further supplementation.  Thanks, Meera K. Posey Pronto, PharmD, BCPS.  Clinical Pharmacist Pager [phone]. 02/02/2013 1:55 PM    Addendum  Adding zosyn for gram neg coverage.   Plan  Zosyn 3.375g IV q8

## 2013-02-02 NOTE — ED Notes (Signed)
Pt states that her diarrhea is "lime green" in color. Pt cannot tolerate solids, but has been drinking soda and water.

## 2013-02-02 NOTE — ED Notes (Signed)
Did in and out cath on patient got dark urine in return

## 2013-02-03 ENCOUNTER — Inpatient Hospital Stay (HOSPITAL_COMMUNITY): Payer: PRIVATE HEALTH INSURANCE

## 2013-02-03 ENCOUNTER — Encounter (HOSPITAL_COMMUNITY): Payer: Self-pay | Admitting: *Deleted

## 2013-02-03 DIAGNOSIS — N61 Mastitis without abscess: Secondary | ICD-10-CM

## 2013-02-03 DIAGNOSIS — E66812 Obesity, class 2: Secondary | ICD-10-CM | POA: Diagnosis present

## 2013-02-03 DIAGNOSIS — L0231 Cutaneous abscess of buttock: Secondary | ICD-10-CM

## 2013-02-03 DIAGNOSIS — L03317 Cellulitis of buttock: Secondary | ICD-10-CM

## 2013-02-03 DIAGNOSIS — N179 Acute kidney failure, unspecified: Secondary | ICD-10-CM | POA: Diagnosis present

## 2013-02-03 DIAGNOSIS — I10 Essential (primary) hypertension: Secondary | ICD-10-CM

## 2013-02-03 DIAGNOSIS — A419 Sepsis, unspecified organism: Principal | ICD-10-CM

## 2013-02-03 DIAGNOSIS — N183 Chronic kidney disease, stage 3 unspecified: Secondary | ICD-10-CM

## 2013-02-03 DIAGNOSIS — E119 Type 2 diabetes mellitus without complications: Secondary | ICD-10-CM

## 2013-02-03 DIAGNOSIS — E876 Hypokalemia: Secondary | ICD-10-CM | POA: Diagnosis present

## 2013-02-03 DIAGNOSIS — R197 Diarrhea, unspecified: Secondary | ICD-10-CM | POA: Diagnosis present

## 2013-02-03 DIAGNOSIS — E871 Hypo-osmolality and hyponatremia: Secondary | ICD-10-CM | POA: Diagnosis present

## 2013-02-03 DIAGNOSIS — E6609 Other obesity due to excess calories: Secondary | ICD-10-CM | POA: Diagnosis present

## 2013-02-03 LAB — URINALYSIS, ROUTINE W REFLEX MICROSCOPIC
Glucose, UA: NEGATIVE mg/dL
Ketones, ur: NEGATIVE mg/dL
Leukocytes, UA: NEGATIVE
Nitrite: NEGATIVE
Protein, ur: 30 mg/dL — AB
Specific Gravity, Urine: 1.029 (ref 1.005–1.030)
Urobilinogen, UA: 1 mg/dL (ref 0.0–1.0)
pH: 5 (ref 5.0–8.0)

## 2013-02-03 LAB — CBC
HCT: 29.7 % — ABNORMAL LOW (ref 36.0–46.0)
Hemoglobin: 9.9 g/dL — ABNORMAL LOW (ref 12.0–15.0)
MCH: 26 pg (ref 26.0–34.0)
MCHC: 33.3 g/dL (ref 30.0–36.0)
MCV: 78 fL (ref 78.0–100.0)
Platelets: 193 10*3/uL (ref 150–400)
RBC: 3.81 MIL/uL — ABNORMAL LOW (ref 3.87–5.11)
RDW: 16.4 % — ABNORMAL HIGH (ref 11.5–15.5)
WBC: 20.3 10*3/uL — ABNORMAL HIGH (ref 4.0–10.5)

## 2013-02-03 LAB — COMPREHENSIVE METABOLIC PANEL
ALT: 37 U/L — ABNORMAL HIGH (ref 0–35)
AST: 36 U/L (ref 0–37)
Albumin: 1.9 g/dL — ABNORMAL LOW (ref 3.5–5.2)
Alkaline Phosphatase: 122 U/L — ABNORMAL HIGH (ref 39–117)
BUN: 41 mg/dL — ABNORMAL HIGH (ref 6–23)
CO2: 20 mEq/L (ref 19–32)
Calcium: 8.8 mg/dL (ref 8.4–10.5)
Chloride: 96 mEq/L (ref 96–112)
Creatinine, Ser: 2.4 mg/dL — ABNORMAL HIGH (ref 0.50–1.10)
GFR calc Af Amer: 24 mL/min — ABNORMAL LOW (ref >90)
GFR calc non Af Amer: 21 mL/min — ABNORMAL LOW (ref >90)
Glucose, Bld: 291 mg/dL — ABNORMAL HIGH (ref 70–99)
Potassium: 4.1 mEq/L (ref 3.5–5.1)
Sodium: 128 mEq/L — ABNORMAL LOW (ref 135–145)
Total Bilirubin: 1 mg/dL (ref 0.3–1.2)
Total Protein: 5.8 g/dL — ABNORMAL LOW (ref 6.0–8.3)

## 2013-02-03 LAB — GLUCOSE, CAPILLARY
Glucose-Capillary: 237 mg/dL — ABNORMAL HIGH (ref 70–99)
Glucose-Capillary: 225 mg/dL — ABNORMAL HIGH (ref 70–99)
Glucose-Capillary: 247 mg/dL — ABNORMAL HIGH (ref 70–99)

## 2013-02-03 LAB — URINE CULTURE
Colony Count: NO GROWTH
Culture: NO GROWTH

## 2013-02-03 LAB — URINE MICROSCOPIC-ADD ON

## 2013-02-03 LAB — CK: Total CK: 158 U/L (ref 7–177)

## 2013-02-03 LAB — SODIUM, URINE, RANDOM: Sodium, Ur: <10 mEq/L

## 2013-02-03 LAB — VANCOMYCIN, RANDOM: Vancomycin Rm: 10.7 ug/mL

## 2013-02-03 LAB — CREATININE, URINE, RANDOM: Creatinine, Urine: 160.54 mg/dL

## 2013-02-03 MED ORDER — HYDROMORPHONE HCL PF 1 MG/ML IJ SOLN
1.0000 mg | INTRAMUSCULAR | Status: DC | PRN
  Administered 2013-02-04: 1 mg via INTRAVENOUS
  Filled 2013-02-03 (×2): qty 1

## 2013-02-03 MED ORDER — INSULIN ASPART 100 UNIT/ML ~~LOC~~ SOLN
0.0000 [IU] | Freq: Every day | SUBCUTANEOUS | Status: DC
  Administered 2013-02-03: 2 [IU] via SUBCUTANEOUS

## 2013-02-03 MED ORDER — OXYCODONE-ACETAMINOPHEN 5-325 MG PO TABS
1.0000 | ORAL_TABLET | ORAL | Status: DC | PRN
  Administered 2013-02-03 – 2013-02-05 (×7): 2 via ORAL
  Filled 2013-02-03 (×7): qty 2

## 2013-02-03 MED ORDER — INSULIN ASPART PROT & ASPART (70-30 MIX) 100 UNIT/ML ~~LOC~~ SUSP: 40.0000 [IU] | Freq: Every day | SUBCUTANEOUS | Status: DC

## 2013-02-03 MED ORDER — ENOXAPARIN SODIUM 40 MG/0.4ML ~~LOC~~ SOLN
40.0000 mg | SUBCUTANEOUS | Status: AC
  Administered 2013-02-03: 40 mg via SUBCUTANEOUS
  Filled 2013-02-03: qty 0.4

## 2013-02-03 MED ORDER — ENOXAPARIN SODIUM 40 MG/0.4ML ~~LOC~~ SOLN
40.0000 mg | SUBCUTANEOUS | Status: DC
  Filled 2013-02-03: qty 0.4

## 2013-02-03 MED ORDER — ALBUTEROL SULFATE (5 MG/ML) 0.5% IN NEBU
2.5000 mg | INHALATION_SOLUTION | RESPIRATORY_TRACT | Status: DC | PRN
  Administered 2013-02-08 – 2013-02-16 (×2): 2.5 mg via RESPIRATORY_TRACT
  Filled 2013-02-03 (×4): qty 0.5

## 2013-02-03 MED ORDER — INSULIN ASPART PROT & ASPART (70-30 MIX) 100 UNIT/ML ~~LOC~~ SUSP
40.0000 [IU] | Freq: Every day | SUBCUTANEOUS | Status: DC
  Administered 2013-02-04 – 2013-02-07 (×3): 40 [IU] via SUBCUTANEOUS

## 2013-02-03 MED ORDER — ENOXAPARIN SODIUM 40 MG/0.4ML ~~LOC~~ SOLN
40.0000 mg | SUBCUTANEOUS | Status: DC
  Administered 2013-02-05 – 2013-02-07 (×3): 40 mg via SUBCUTANEOUS
  Filled 2013-02-03 (×4): qty 0.4

## 2013-02-03 MED ORDER — INSULIN ASPART PROT & ASPART (70-30 MIX) 100 UNIT/ML ~~LOC~~ SUSP
30.0000 [IU] | Freq: Every day | SUBCUTANEOUS | Status: DC
  Administered 2013-02-03 – 2013-02-04 (×2): 30 [IU] via SUBCUTANEOUS

## 2013-02-03 MED ORDER — LINEZOLID 2 MG/ML IV SOLN
600.0000 mg | Freq: Two times a day (BID) | INTRAVENOUS | Status: DC
  Administered 2013-02-03 – 2013-02-09 (×10): 600 mg via INTRAVENOUS
  Filled 2013-02-03 (×14): qty 300

## 2013-02-03 MED ORDER — INSULIN ASPART 100 UNIT/ML ~~LOC~~ SOLN
0.0000 [IU] | Freq: Every day | SUBCUTANEOUS | Status: DC
  Administered 2013-02-04: 5 [IU] via SUBCUTANEOUS

## 2013-02-03 NOTE — Progress Notes (Signed)
[facility] Progress Note [place] TEAM 1 - Stepdown/ICU TEAM   Page Danielski [id number] DOB: 20-Dec-1950 DOA: 02/02/2013 PCP: [name], MD  Brief narrative: [name] [name] is a 62 y.o. female with PMH of DM, Obesity, HTN, CKD 3, presents from home today with the above complaint.  She c/o diarrhea since Friday, it is non bloodly and non bilious, associated with lower abdominal cramps, she had 2 episodes today thus far, h/o recent antibiotic use was 2-3 weeks ago for UTI.  In addition she also noted a boil in her L buttock /perineal region 1 week ago, this has progressively increased in size and is very painful, she denies any drainage from it.  She denies any fevers, reports chills.  Upon evaluation in the ER she was noted to be tachycardic with HR of 130s, BP as low as 73/49 which improved with IVF to low 100s, WBC of 23K and electrolyte abormalities   Assessment/Plan: Principal Problem:   Sepsis/ Cellulitis - Vanc being changed to Zyvox due to ARF - surgery consulted for cellulitis- possible abscess - possible nec fascitis  - pt has h/o Nec fascitis in the past- MRSA and Proteus were cultured   Active Problems:     DM (diabetes mellitus) - increase AM 70/30 dose to 40 U as she takes at home - add sliding scale at lunch and bedtime      ARF (acute renal failure) on CKD (chronic kidney disease), stage III - likely prerenal and ATN - appreciate Nephro consult    Diarrhea Resolved per patient    Hyponatremia - likely sign of pre-renal state - cont NS     Hypokalemia -replaced   Morbid obesity  Code Status: full code Family Communication: none Disposition Plan: follow in SDU  Consultants: Nephro Surgery  Procedures: none  Antibiotics: Vanc- 4/29>>  Zosyn- 4/29 Zyvox- 4/29  DVT prophylaxis: Lovenox  HPI/Subjective: Having a significant amount of pain in left buttock- no other complaints. Diarrhea has resolved.    Objective: Blood  pressure 101/44, pulse 105, temperature 98.5 F (36.9 C), temperature source Oral, resp. rate 11, height 5\' 4"  (1.626 m), weight 134.7 kg (296 lb 15.4 oz), SpO2 92.00%.  Intake/Output Summary (Last 24 hours) at 02/03/13 1710 Last data filed at 02/03/13 1100  Gross per 24 hour  Intake   3365 ml  Output      0 ml  Net   3365 ml     Exam: General: No acute respiratory distress Lungs: Clear to auscultation bilaterally without wheezes or crackles Cardiovascular: Regular rate and rhythm without murmur gallop or rub normal S1 and S2 Abdomen: Nontender, nondistended, soft, bowel sounds positive, no rebound, no ascites, no appreciable mass Extremities: No significant cyanosis, clubbing, or edema bilateral lower extremities Skin- induration/ erythema/warm and tenderness noted in left buttock extending into upper thigh  Data Reviewed: Basic Metabolic Panel:  Recent Labs Lab 02/02/13 0850 02/02/13 1447 02/02/13 2102 02/03/13 0515  NA 130*  --  130* 128*  K 2.9*  --  3.8 4.1  CL 93*  --  97 96  CO2 22  --  20 20  GLUCOSE 231*  --  291* 291*  BUN 28*  --  35* 41*  CREATININE 1.77*  --  2.23* 2.40*  CALCIUM 9.6  --  8.7 8.8  MG  --  1.5  --   --   PHOS  --  2.9  --   --    Liver Function Tests:  Recent Labs  Lab 02/02/13 0850 02/03/13 0515  AST 45* 36  ALT 41* 37*  ALKPHOS 123* 122*  BILITOT 1.2 1.0  PROT 7.0 5.8*  ALBUMIN 2.5* 1.9*    Recent Labs Lab 02/02/13 0850  LIPASE 17   No results found for this basename: AMMONIA,  in the last 168 hours CBC:  Recent Labs Lab 02/02/13 0850 02/03/13 0515  WBC 23.3* 20.3*  NEUTROABS 21.1*  --   HGB 11.4* 9.9*  HCT 33.9* 29.7*  MCV 78.5 78.0  PLT 209 193   Cardiac Enzymes:  Recent Labs Lab 02/02/13 0850  TROPONINI <0.30   BNP (last 3 results) No results found for this basename: PROBNP,  in the last 8760 hours CBG:  Recent Labs Lab 02/02/13 0820 02/02/13 1724 02/03/13 1344  GLUCAP 212* 245* 237*    Recent  Results (from the past 240 hour(s))  CULTURE, BLOOD (ROUTINE X 2)     Status: None   Collection Time    02/02/13 10:27 AM      Result Value Range Status   Specimen Description BLOOD FOREARM RIGHT   Final   Special Requests BOTTLES DRAWN AEROBIC ONLY 5CC   Final   Culture  Setup Time 02/02/2013 15:05   Final   Culture     Final   Value:        BLOOD CULTURE RECEIVED NO GROWTH TO DATE CULTURE WILL BE HELD FOR 5 DAYS BEFORE ISSUING A FINAL NEGATIVE REPORT   Report Status PENDING   Incomplete  CULTURE, BLOOD (ROUTINE X 2)     Status: None   Collection Time    02/02/13 10:35 AM      Result Value Range Status   Specimen Description BLOOD HAND RIGHT   Final   Special Requests BOTTLES DRAWN AEROBIC AND ANAEROBIC 10CC   Final   Culture  Setup Time 02/02/2013 15:05   Final   Culture     Final   Value:        BLOOD CULTURE RECEIVED NO GROWTH TO DATE CULTURE WILL BE HELD FOR 5 DAYS BEFORE ISSUING A FINAL NEGATIVE REPORT   Report Status PENDING   Incomplete     Studies:  Recent x-ray studies have been reviewed in detail by the Attending Physician  Scheduled Meds:  Scheduled Meds: . allopurinol  100 mg Oral Daily  . cilostazol  100 mg Oral BID  . enoxaparin (LOVENOX) injection  40 mg Subcutaneous Q24H  . [START ON 02/05/2013] enoxaparin (LOVENOX) injection  40 mg Subcutaneous Q24H  . [START ON 02/04/2013] insulin aspart  0-15 Units Subcutaneous QAC lunch  . insulin aspart  0-5 Units Subcutaneous QHS  . insulin aspart protamine- aspart  30 Units Subcutaneous Q supper  . [START ON 02/04/2013] insulin aspart protamine- aspart  40 Units Subcutaneous Q breakfast  . levothyroxine  150 mcg Oral QAC breakfast  . linezolid  600 mg Intravenous Q12H  . multivitamin with minerals  1 tablet Oral Daily  . pantoprazole  40 mg Oral Daily  . piperacillin-tazobactam (ZOSYN)  IV  3.375 g Intravenous Q8H   Continuous Infusions: . sodium chloride 125 mL/hr at 02/03/13 0402    Time spent on care of this  patient: 35 min   [place]  [phone] Pager - Text Page per Shea Evans as per below:  On-Call/Text Page:      [email or web address]      password [email or web address]  If 7PM-7AM, please contact night-coverage [email or web address] Password [email or web address] 02/03/2013, 5:10 PM  LOS: 1 day

## 2013-02-03 NOTE — Consult Note (Signed)
[name] [name] [birth year]  [id number].   Primary Care MD: Dr. Tarry Kos. [name], MD Requesting MD: Dr. Domenic Polite, MD Chief Complaint/Reason for Consult: Gluteal Cellulitis/?Abscess HPI: Patient is a 62 year old African American female, who has a significant past medical history of diabetes, HTN, thyroid disease, CKD stage 3, presenting with non-bloody/non-bilious diarrhea x 4 days and R gluteal pain.  States that she has recently taken abx for a UTI.  Patient noticed a boil on her R buttock about a week ago that has increased in severity of pain.  Denies fever, abdominal pain, nausea and vomiting.  States that she has had a similar abscess in the past at the same location.   We were consulted due to the cellulitis and possible abscess noted on the R buttock region.     Family History  Problem Relation Age of Onset  . Diabetes Brother     Past Medical History  Diagnosis Date  . History of hip replacement, total   . Hx MRSA infection     abscess left groin  . Arthritis   . GERD (gastroesophageal reflux disease)   . Gout   . Diabetes mellitus   . Hypertension   . Thyroid disease     Past Surgical History  Procedure Laterality Date  . Thyroidectomy    . Abdominal hysterectomy    . Ventral hernia repair    . Laparoscopic cholecystectomy    . Forearm fracture surgery      Left arm  . Joint replacement    . Hernia repair      w/ mesh    Social History:  reports that she has been smoking Cigarettes.  She has a 10 pack-year smoking history. She does not have any smokeless tobacco history on file. She reports that she does not drink alcohol or use illicit drugs.  Allergies:  Allergies  Allergen Reactions  . Ace Inhibitors     Tongue swell  . Morphine And Related Itching    Medications Prior to Admission  Medication Sig Dispense Refill  . albuterol (PROVENTIL HFA;VENTOLIN HFA) 108 (90 BASE) MCG/ACT inhaler Inhale 2 puffs into the lungs every 6 (six) hours as needed for  wheezing or shortness of breath.      . allopurinol (ZYLOPRIM) 100 MG tablet Take 100 mg by mouth daily.        Marland Kitchen amLODipine (NORVASC) 10 MG tablet Take 10 mg by mouth daily.        . B Complex-C (B-COMPLEX WITH VITAMIN C) tablet Take 1 tablet by mouth 2 (two) times daily.      . cilostazol (PLETAL) 100 MG tablet take 1 tablet by mouth twice a day  60 tablet  11  . colchicine 0.6 MG tablet Take 0.6 mg by mouth 2 (two) times daily.        . diclofenac sodium (VOLTAREN) 1 % GEL Apply 2 g topically 2 (two) times daily as needed (for pain).      Marland Kitchen esomeprazole (NEXIUM) 40 MG capsule Take 40 mg by mouth daily before breakfast.        . fluticasone (FLONASE) 50 MCG/ACT nasal spray Place 2 sprays into the nose as needed for allergies.       . hydrochlorothiazide (,MICROZIDE/HYDRODIURIL,) 12.5 MG capsule Take 12.5 mg by mouth daily.        . Insulin Aspart Prot & Aspart (NOVOLOG MIX 70/30 FLEXPEN Mayer) Inject 30-40 Units into the skin 2 (two) times daily with a meal. take 40  units in the morning and 30 units in the evening      . insulin aspart protamine-insulin aspart (NOVOLOG 70/30) (70-30) 100 UNIT/ML injection Inject into the skin 2 (two) times daily with a meal.        . levothyroxine (SYNTHROID, LEVOTHROID) 150 MCG tablet Take 150 mcg by mouth daily.        . meloxicam (MOBIC) 15 MG tablet Take 15 mg by mouth daily.        . Multiple Vitamin (MULTIVITAMIN WITH MINERALS) TABS Take 1 tablet by mouth daily.      Marland Kitchen oxyCODONE-acetaminophen (PERCOCET/ROXICET) 5-325 MG per tablet Take 1 tablet by mouth every 4 (four) hours as needed for pain.      Marland Kitchen solifenacin (VESICARE) 5 MG tablet Take 5 mg by mouth daily.         Blood pressure 101/44, pulse 105, temperature 98.5 F (36.9 C), temperature source Oral, resp. rate 11, height 5\' 4"  (1.626 m), weight 134.7 kg (296 lb 15.4 oz), SpO2 92.00%. Physical Exam: General: pleasant, WD, obese African American female who is laying in bed in NAD HEENT: head is  normocephalic, atraumatic.  Sclera are noninjected.  Ears and nose without any masses or lesions.  Mouth is pink and moist Heart: regular, rate, and rhythm; no obvious murmurs, gallops, or rubs noted - though auscultation was difficult due to body habitus.  Palpable pedal pulses bilaterally Lungs: Bilateral expiratory wheezing noted.  Respiratory effort nonlabored Abd: soft, NT/ND, +BS, no masses, hernias, or organomegaly MS: all 4 extremities are symmetrical with no obvious cyanosis, clubbing, or edema. Skin: R buttock cellulitis that extends 11cm medially, which is indurated and very TTP.  There was an area of fluctuance noted around the gluteal cleft with a central area of ulceration.  No discharge was present. Psych: A&Ox3 with an appropriate affect.  Results for orders placed during the hospital encounter of 02/02/13 (from the past 48 hour(s))  GLUCOSE, CAPILLARY     Status: Abnormal   Collection Time    02/02/13  8:20 AM      Result Value Range   Glucose-Capillary 212 (*) 70 - 99 mg/dL  CBC WITH DIFFERENTIAL     Status: Abnormal   Collection Time    02/02/13  8:50 AM      Result Value Range   WBC 23.3 (*) 4.0 - 10.5 K/uL   RBC 4.32  3.87 - 5.11 MIL/uL   Hemoglobin 11.4 (*) 12.0 - 15.0 g/dL   HCT 33.9 (*) 36.0 - 46.0 %   MCV 78.5  78.0 - 100.0 fL   MCH 26.4  26.0 - 34.0 pg   MCHC 33.6  30.0 - 36.0 g/dL   RDW 16.1 (*) 11.5 - 15.5 %   Platelets 209  150 - 400 K/uL   Neutrophils Relative 91 (*) 43 - 77 %   Neutro Abs 21.1 (*) 1.7 - 7.7 K/uL   Lymphocytes Relative 4 (*) 12 - 46 %   Lymphs Abs 1.0  0.7 - 4.0 K/uL   Monocytes Relative 5  3 - 12 %   Monocytes Absolute 1.2 (*) 0.1 - 1.0 K/uL   Eosinophils Relative 0  0 - 5 %   Eosinophils Absolute 0.0  0.0 - 0.7 K/uL   Basophils Relative 0  0 - 1 %   Basophils Absolute 0.0  0.0 - 0.1 K/uL  COMPREHENSIVE METABOLIC PANEL     Status: Abnormal   Collection Time    02/02/13  8:50  AM      Result Value Range   Sodium 130 (*) 135 - 145  mEq/L   Potassium 2.9 (*) 3.5 - 5.1 mEq/L   Chloride 93 (*) 96 - 112 mEq/L   CO2 22  19 - 32 mEq/L   Glucose, Bld 231 (*) 70 - 99 mg/dL   BUN 28 (*) 6 - 23 mg/dL   Creatinine, Ser 1.77 (*) 0.50 - 1.10 mg/dL   Calcium 9.6  8.4 - 10.5 mg/dL   Total Protein 7.0  6.0 - 8.3 g/dL   Albumin 2.5 (*) 3.5 - 5.2 g/dL   AST 45 (*) 0 - 37 U/L   ALT 41 (*) 0 - 35 U/L   Alkaline Phosphatase 123 (*) 39 - 117 U/L   Total Bilirubin 1.2  0.3 - 1.2 mg/dL   GFR calc non Af Amer 30 (*) >90 mL/min   GFR calc Af Amer 34 (*) >90 mL/min   Comment:            The eGFR has been calculated     using the CKD EPI equation.     This calculation has not been     validated in all clinical     situations.     eGFR's persistently     <90 mL/min signify     possible Chronic Kidney Disease.  LIPASE, BLOOD     Status: None   Collection Time    02/02/13  8:50 AM      Result Value Range   Lipase 17  11 - 59 U/L  TROPONIN I     Status: None   Collection Time    02/02/13  8:50 AM      Result Value Range   Troponin I <0.30  <0.30 ng/mL   Comment:            Due to the release kinetics of cTnI,     a negative result within the first hours     of the onset of symptoms does not rule out     myocardial infarction with certainty.     If myocardial infarction is still suspected,     repeat the test at appropriate intervals.  CULTURE, BLOOD (ROUTINE X 2)     Status: None   Collection Time    02/02/13 10:27 AM      Result Value Range   Specimen Description BLOOD FOREARM RIGHT     Special Requests BOTTLES DRAWN AEROBIC ONLY 5CC     Culture  Setup Time 02/02/2013 15:05     Culture       Value:        BLOOD CULTURE RECEIVED NO GROWTH TO DATE CULTURE WILL BE HELD FOR 5 DAYS BEFORE ISSUING A FINAL NEGATIVE REPORT   Report Status PENDING    LACTIC ACID, PLASMA     Status: None   Collection Time    02/02/13 10:31 AM      Result Value Range   Lactic Acid, Venous 1.8  0.5 - 2.2 mmol/L  CULTURE, BLOOD (ROUTINE X 2)      Status: None   Collection Time    02/02/13 10:35 AM      Result Value Range   Specimen Description BLOOD HAND RIGHT     Special Requests BOTTLES DRAWN AEROBIC AND ANAEROBIC 10CC     Culture  Setup Time 02/02/2013 15:05     Culture       Value:  BLOOD CULTURE RECEIVED NO GROWTH TO DATE CULTURE WILL BE HELD FOR 5 DAYS BEFORE ISSUING A FINAL NEGATIVE REPORT   Report Status PENDING    URINALYSIS, ROUTINE W REFLEX MICROSCOPIC     Status: Abnormal   Collection Time    02/02/13 12:23 PM      Result Value Range   Color, Urine AMBER (*) YELLOW   Comment: BIOCHEMICALS MAY BE AFFECTED BY COLOR   APPearance TURBID (*) CLEAR   Specific Gravity, Urine 1.035 (*) 1.005 - 1.030   pH 5.0  5.0 - 8.0   Glucose, UA NEGATIVE  NEGATIVE mg/dL   Hgb urine dipstick SMALL (*) NEGATIVE   Bilirubin Urine MODERATE (*) NEGATIVE   Ketones, ur 15 (*) NEGATIVE mg/dL   Protein, ur 100 (*) NEGATIVE mg/dL   Urobilinogen, UA 1.0  0.0 - 1.0 mg/dL   Nitrite NEGATIVE  NEGATIVE   Leukocytes, UA TRACE (*) NEGATIVE  URINE MICROSCOPIC-ADD ON     Status: Abnormal   Collection Time    02/02/13 12:23 PM      Result Value Range   Squamous Epithelial / LPF FEW (*) RARE   WBC, UA 0-2  <3 WBC/hpf   RBC / HPF 0-2  <3 RBC/hpf   Bacteria, UA FEW (*) RARE   Casts GRANULAR CAST (*) NEGATIVE   Urine-Other AMORPHOUS URATES/PHOSPHATES    MAGNESIUM     Status: None   Collection Time    02/02/13  2:47 PM      Result Value Range   Magnesium 1.5  1.5 - 2.5 mg/dL  PHOSPHORUS     Status: None   Collection Time    02/02/13  2:47 PM      Result Value Range   Phosphorus 2.9  2.3 - 4.6 mg/dL  GLUCOSE, CAPILLARY     Status: Abnormal   Collection Time    02/02/13  5:24 PM      Result Value Range   Glucose-Capillary 245 (*) 70 - 99 mg/dL  BASIC METABOLIC PANEL     Status: Abnormal   Collection Time    02/02/13  9:02 PM      Result Value Range   Sodium 130 (*) 135 - 145 mEq/L   Potassium 3.8  3.5 - 5.1 mEq/L   Comment: NO  VISIBLE HEMOLYSIS   Chloride 97  96 - 112 mEq/L   CO2 20  19 - 32 mEq/L   Glucose, Bld 291 (*) 70 - 99 mg/dL   BUN 35 (*) 6 - 23 mg/dL   Creatinine, Ser 2.23 (*) 0.50 - 1.10 mg/dL   Calcium 8.7  8.4 - 10.5 mg/dL   GFR calc non Af Amer 22 (*) >90 mL/min   GFR calc Af Amer 26 (*) >90 mL/min   Comment:            The eGFR has been calculated     using the CKD EPI equation.     This calculation has not been     validated in all clinical     situations.     eGFR's persistently     <90 mL/min signify     possible Chronic Kidney Disease.  CBC     Status: Abnormal   Collection Time    02/03/13  5:15 AM      Result Value Range   WBC 20.3 (*) 4.0 - 10.5 K/uL   RBC 3.81 (*) 3.87 - 5.11 MIL/uL   Hemoglobin 9.9 (*) 12.0 - 15.0 g/dL  HCT 29.7 (*) 36.0 - 46.0 %   MCV 78.0  78.0 - 100.0 fL   MCH 26.0  26.0 - 34.0 pg   MCHC 33.3  30.0 - 36.0 g/dL   RDW 16.4 (*) 11.5 - 15.5 %   Platelets 193  150 - 400 K/uL  COMPREHENSIVE METABOLIC PANEL     Status: Abnormal   Collection Time    02/03/13  5:15 AM      Result Value Range   Sodium 128 (*) 135 - 145 mEq/L   Potassium 4.1  3.5 - 5.1 mEq/L   Chloride 96  96 - 112 mEq/L   CO2 20  19 - 32 mEq/L   Glucose, Bld 291 (*) 70 - 99 mg/dL   BUN 41 (*) 6 - 23 mg/dL   Creatinine, Ser 2.40 (*) 0.50 - 1.10 mg/dL   Calcium 8.8  8.4 - 10.5 mg/dL   Total Protein 5.8 (*) 6.0 - 8.3 g/dL   Albumin 1.9 (*) 3.5 - 5.2 g/dL   AST 36  0 - 37 U/L   ALT 37 (*) 0 - 35 U/L   Alkaline Phosphatase 122 (*) 39 - 117 U/L   Total Bilirubin 1.0  0.3 - 1.2 mg/dL   GFR calc non Af Amer 21 (*) >90 mL/min   GFR calc Af Amer 24 (*) >90 mL/min   Comment:            The eGFR has been calculated     using the CKD EPI equation.     This calculation has not been     validated in all clinical     situations.     eGFR's persistently     <90 mL/min signify     possible Chronic Kidney Disease.  GLUCOSE, CAPILLARY     Status: Abnormal   Collection Time    02/03/13  1:44 PM       Result Value Range   Glucose-Capillary 237 (*) 70 - 99 mg/dL   Dg Chest 2 View  02/02/2013  *RADIOLOGY REPORT*  Clinical Data: 62 year old female with shortness of breath cough low grade fever diarrhea.  CHEST - 2 VIEW  Comparison: 07/31/2011 and earlier.  Findings: Semi upright AP and lateral views of the chest. Cardiomegaly not significantly changed. Other mediastinal contours are within normal limits.  Visualized tracheal air column is within normal limits.  No pneumothorax or pulmonary edema.  No pleural effusion or consolidation.  No confluent pulmonary opacity. No acute osseous abnormality identified.  IMPRESSION: Stable cardiomegaly.  No acute cardiopulmonary abnormality.   Original Report Authenticated By: Roselyn Reef, M.D.    US Pelvis Limited  02/02/2013  *RADIOLOGY REPORT*  Clinical Data: Left peroneal pain.  Gluteal mass.  Abscess.  US PELVIS LIMITED OR FOLLOW UP  Comparison: CT 02/02/2013.  Findings: Scanning was per formed over the posterior peroneal region and left medial gluteal region.  Subcutaneous edema is present without focal fluid collection.  No abscess.  IMPRESSION: Negative for abscess.  Subcutaneous edema, probably representing cellulitis.   Original Report Authenticated By: Dereck Ligas, M.D.    Ct Abdomen Pelvis W Contrast  02/02/2013  *RADIOLOGY REPORT*  Clinical Data: Abdominal pain and diarrhea.  CT ABDOMEN AND PELVIS WITH CONTRAST  Technique:  Multidetector CT imaging of the abdomen and pelvis was performed following the standard protocol during bolus administration of intravenous contrast.  Contrast: 13mL OMNIPAQUE IOHEXOL 300 MG/ML  SOLN  Comparison: None.  Findings: Lung bases are clear.  No  evidence for free intraperitoneal air.  No gross abnormality to the liver, portal venous system or spleen. The gallbladder appears to be absent.  No gross abnormality to the pancreas.  There is a diffuse thickening and nodularity in the left adrenal gland.  Lateral left adrenal  nodule measures up to 2.7 cm.  There is perinephric edema bilaterally, right side greater than left.  There is an exophytic hyperdense structure along the posterior lower pole of the right kidney that measures 1.4 cm. There is an adjacent low density structure that measures 2.3 cm. Indeterminate 1.4 cm low density structure along the medial right kidney.  Suspect small cysts in the left kidney.  The patient has a right hip replacement that causes artifact in the pelvis.  However, there is a large amount of high density material along the posterior aspect of the bladder which is concerning for multiple stones or calcifications.  No significant free fluid or lymphadenopathy in the abdomen or pelvis.  There is a small ventral hernia containing fat.  No significant colon wall thickening.  No acute bony abnormality. There is subcutaneous edema in the left hemi pelvis.  There is no significant excretion from the kidneys on the delayed images.  The patient is noted to have creatinine level 1.77.  IMPRESSION: There is high density material along the posterior aspect of the bladder.  Differential diagnosis includes multiple bladder stones versus a calcified lesion.  Recommend urology consultation.  No excretion from the kidneys on the delayed images.  This finding along with the elevated creatinine level raises concern for renal function impairment and recommend following renal function to exclude worsening renal function or ATN.  There are multiple low-density structures involving both kidneys that could represent cysts.  There is an exophytic hyperdense structure in the right kidney lower pole which could represent a complex or hemorrhagic cyst.  However, these renal structures are indeterminate.  There is also nodularity and enlargement of the left adrenal gland which could represent hyperplasia or adenomas but indeterminate on this postcontrast examination.  The adrenal and renal findings could be more definitively  evaluated with MRI.   Original Report Authenticated By: Markus Daft, M.D.        Assessment/Plan 1. R Gluteal Cellulitis/?Abscess  - Since there is an area of fluctuance, it would be appropriate to I & D.  However, given the extension of cellulitis and pt's comorbidities and comfort level, it would be advised to perform this in the OR as opposed to the bedside.  Will plan on taking to the OR tomorrow (02/04/13). - Cont dilaudid/percocet for pain control - Cont vanc/zosyn for abx therapy - Begin NPO at midnight 02/04/13 for surgical intervention - Cont lovenox, SCDs for VTE prophylaxis 2. Diarrhea - Seems to be resolving - Cont IV fluids 3. CKD Stage 3 - Cont home meds per medicine 4. DMII - Cont home meds per medicine 5. HTN - Cont home meds per medicine Donah Driver 02/03/2013, 3:24 PM Office: [phone]

## 2013-02-03 NOTE — ED Provider Notes (Signed)
Medical screening examination/treatment/procedure(s) were conducted as a shared visit with non-physician practitioner(s) and myself.  I personally evaluated the patient during the encounter   Julianne Rice, MD 02/03/13 1740

## 2013-02-03 NOTE — Progress Notes (Addendum)
Pt has boil/abscess erythematous area on upper Left chest noted at bath. As info.  BP 90-100s sys. Pain improved with PO & IVP. Will continue to monitor.

## 2013-02-03 NOTE — Progress Notes (Signed)
Inpatient Diabetes Program Recommendations  AACE/ADA: New Consensus Statement on Inpatient Glycemic Control (2013)  Target Ranges:  Prepandial:   less than 140 mg/dL      Peak postprandial:   less than 180 mg/dL (1-2 hours)      Critically ill patients:  140 - 180 mg/dL   Reason for Visit: Results for LAKEVA, CLEMSON (MRN [id number]) as of 02/03/2013 13:00  Ref. Range 02/02/2013 08:50 02/02/2013 21:02 02/03/2013 05:15  Glucose Latest Range: 70-99 mg/dL 231 (H) 291 (H) 291 (H)   Please add Novolog moderate correction.  Also please consider checking A1C to determine pre-hospitalization glycemic control.  May need adjustment in 70/30 dose also. Will follow.

## 2013-02-03 NOTE — Consult Note (Signed)
Reason for Consult:AKI/CKD Referring Physician: Glendora Score, MD  [name] [name] is an 62 y.o. female.  HPI: Pt is a 62yo F with PMH sig for DM, Obesity, HTN, CKD 3, who presented to [facility] ED on 02/02/13 with c/o diarrhea since Friday, it is non bloody and associated with lower abdominal cramps.  She did have a h/o recent antibiotic use about 2-3 weeks ago for UTI. In addition she also noted a boil in her L buttock /perineal region 1 week ago, this has progressively increased in size and is very painful, she denies any drainage from it. She denies any fevers, reports chills. Upon evaluation in the ER she was noted to be tachycardic with HR of 130s, BP as low as 73/49 which improved with IVF to low 100s, WBC of 23K and electrolyte abormalities.    Of note, she also received IV contrast for a CT scan of abdomen and pelvis in the ED despite her elevated Scr and has also been dosed with Vancomycin.  We were asked to help further eval and manage her AKI/CKD.  Her trend in Scr is seen below:   Trend in Creatinine: Creatinine, Ser  Date/Time Value Range Status  02/03/2013  5:15 AM 2.40* 0.50 - 1.10 mg/dL Final  02/02/2013  9:02 PM 2.23* 0.50 - 1.10 mg/dL Final  02/02/2013  8:50 AM 1.77* 0.50 - 1.10 mg/dL Final  08/06/2011  3:53 AM 1.27* 0.50 - 1.10 mg/dL Final  08/05/2011  4:41 AM 1.21* 0.50 - 1.10 mg/dL Final  08/04/2011  4:45 AM 1.28* 0.50 - 1.10 mg/dL Final  07/31/2011 11:20 AM 1.11* 0.50 - 1.10 mg/dL Final  07/01/2010  4:55 AM 1.39* 0.4 - 1.2 mg/dL Final  06/30/2010  5:00 AM 1.35* 0.4 - 1.2 mg/dL Final  06/28/2010  5:00 AM 1.23* 0.4 - 1.2 mg/dL Final  06/27/2010  9:07 AM 1.47* 0.4 - 1.2 mg/dL Final  06/26/2010  5:00 AM 1.32* 0.4 - 1.2 mg/dL Final  06/25/2010  6:30 AM 1.07  0.4 - 1.2 mg/dL Final  06/24/2010  5:53 AM 1.07  0.4 - 1.2 mg/dL Final  06/23/2010  6:43 AM 1.15  0.4 - 1.2 mg/dL Final  06/22/2010  5:30 AM 1.42* 0.4 - 1.2 mg/dL Final  06/21/2010 12:38 PM 1.53* 0.4 - 1.2 mg/dL Final  08/19/2009 11:15  AM 1.42* 0.4 - 1.2 mg/dL Final  08/18/2009  5:25 AM 1.64* 0.4 - 1.2 mg/dL Final  08/17/2009  4:30 AM 1.51* 0.4 - 1.2 mg/dL Final  08/16/2009  4:55 AM 1.13  0.4 - 1.2 mg/dL Final  08/15/2009  5:00 PM 1.13  0.4 - 1.2 mg/dL Final  08/08/2009  4:03 AM 1.15  0.4 - 1.2 mg/dL Final  08/07/2009  4:30 AM 1.00  0.4 - 1.2 mg/dL Final  08/06/2009  5:00 AM 1.17  0.4 - 1.2 mg/dL Final  08/01/2009 11:20 AM 1.34* 0.4 - 1.2 mg/dL Final  12/15/2008  4:25 AM 1.34* 0.4 - 1.2 mg/dL Final  12/13/2008  4:48 AM 1.06  0.4 - 1.2 mg/dL Final  12/10/2008  4:15 AM 1.01  0.4 - 1.2 mg/dL Final  12/08/2008  4:00 AM 0.94  0.4 - 1.2 mg/dL Final  12/07/2008  4:10 AM 0.96  0.4 - 1.2 mg/dL Final  12/06/2008  4:51 AM 0.95  0.4 - 1.2 mg/dL Final  12/04/2008  5:52 AM 1.08  0.4 - 1.2 mg/dL Final  12/03/2008  4:00 AM 1.10  0.4 - 1.2 mg/dL Final  12/01/2008  4:35 AM 0.91  0.4 - 1.2  mg/dL Final  11/30/2008  3:40 AM 1.09  0.4 - 1.2 mg/dL Final  11/29/2008 12:05 PM 1.15  0.4 - 1.2 mg/dL Final  11/29/2008  7:05 AM 1.19  0.4 - 1.2 mg/dL Final  11/28/2008  6:30 AM 1.62* 0.4 - 1.2 mg/dL Final    PMH:   Past Medical History  Diagnosis Date  . History of hip replacement, total   . Hx MRSA infection     abscess left groin  . Arthritis   . GERD (gastroesophageal reflux disease)   . Gout   . Diabetes mellitus   . Hypertension   . Thyroid disease     PSH:   Past Surgical History  Procedure Laterality Date  . Thyroidectomy    . Abdominal hysterectomy    . Ventral hernia repair    . Laparoscopic cholecystectomy    . Forearm fracture surgery      Left arm  . Joint replacement    . Hernia repair      w/ mesh    Allergies:  Allergies  Allergen Reactions  . Ace Inhibitors     Tongue swell  . Morphine And Related Itching    Medications:   Prior to Admission medications   Medication Sig Start Date End Date Taking? Authorizing Provider  albuterol (PROVENTIL HFA;VENTOLIN HFA) 108 (90 BASE) MCG/ACT inhaler Inhale 2 puffs into the lungs  every 6 (six) hours as needed for wheezing or shortness of breath.   Yes Historical Provider, MD  allopurinol (ZYLOPRIM) 100 MG tablet Take 100 mg by mouth daily.     Yes Historical Provider, MD  amLODipine (NORVASC) 10 MG tablet Take 10 mg by mouth daily.     Yes Historical Provider, MD  B Complex-C (B-COMPLEX WITH VITAMIN C) tablet Take 1 tablet by mouth 2 (two) times daily.   Yes Historical Provider, MD  cilostazol (PLETAL) 100 MG tablet take 1 tablet by mouth twice a day 08/01/12  Yes Conrad Rockland, MD  colchicine 0.6 MG tablet Take 0.6 mg by mouth 2 (two) times daily.     Yes Historical Provider, MD  diclofenac sodium (VOLTAREN) 1 % GEL Apply 2 g topically 2 (two) times daily as needed (for pain).   Yes Historical Provider, MD  esomeprazole (NEXIUM) 40 MG capsule Take 40 mg by mouth daily before breakfast.     Yes Historical Provider, MD  fluticasone (FLONASE) 50 MCG/ACT nasal spray Place 2 sprays into the nose as needed for allergies.    Yes Historical Provider, MD  hydrochlorothiazide (,MICROZIDE/HYDRODIURIL,) 12.5 MG capsule Take 12.5 mg by mouth daily.     Yes Historical Provider, MD  Insulin Aspart Prot & Aspart (NOVOLOG MIX 70/30 FLEXPEN [place]) Inject 30-40 Units into the skin 2 (two) times daily with a meal. take 40 units in the morning and 30 units in the evening   Yes Historical Provider, MD  insulin aspart protamine-insulin aspart (NOVOLOG 70/30) (70-30) 100 UNIT/ML injection Inject into the skin 2 (two) times daily with a meal.     Yes Historical Provider, MD  levothyroxine (SYNTHROID, LEVOTHROID) 150 MCG tablet Take 150 mcg by mouth daily.     Yes Historical Provider, MD  meloxicam (MOBIC) 15 MG tablet Take 15 mg by mouth daily.     Yes Historical Provider, MD  Multiple Vitamin (MULTIVITAMIN WITH MINERALS) TABS Take 1 tablet by mouth daily.   Yes Historical Provider, MD  oxyCODONE-acetaminophen (PERCOCET/ROXICET) 5-325 MG per tablet Take 1 tablet by mouth every 4 (four)  hours as needed  for pain.   Yes Historical Provider, MD  solifenacin (VESICARE) 5 MG tablet Take 5 mg by mouth daily.    Yes Historical Provider, MD    Inpatient medications: . allopurinol  100 mg Oral Daily  . cilostazol  100 mg Oral BID  . enoxaparin (LOVENOX) injection  40 mg Subcutaneous Q24H  . [START ON 02/04/2013] insulin aspart  0-15 Units Subcutaneous QAC lunch  . insulin aspart  0-5 Units Subcutaneous QHS  . insulin aspart protamine- aspart  30 Units Subcutaneous Q supper  . [START ON 02/04/2013] insulin aspart protamine- aspart  40 Units Subcutaneous Q breakfast  . levothyroxine  150 mcg Oral QAC breakfast  . linezolid  600 mg Intravenous Q12H  . multivitamin with minerals  1 tablet Oral Daily  . pantoprazole  40 mg Oral Daily  . piperacillin-tazobactam (ZOSYN)  IV  3.375 g Intravenous Q8H    Discontinued Meds:   Medications Discontinued During This Encounter  Medication Reason  . clindamycin (CLEOCIN) 300 MG capsule Completed Course  . ergocalciferol (VITAMIN D2) 50000 UNITS capsule Discontinued by provider  . ferrous sulfate 325 (65 FE) MG tablet Patient has not taken in last 30 days  . fexofenadine (ALLEGRA) 180 MG tablet Patient has not taken in last 30 days  . gabapentin (NEURONTIN) 300 MG capsule Discontinued by provider  . gemfibrozil (LOPID) 600 MG tablet Patient has not taken in last 30 days  . guaiFENesin (MUCINEX) 600 MG 12 hr tablet Patient has not taken in last 30 days  . HYDROcodone-acetaminophen (NORCO) 5-325 MG per tablet Patient has not taken in last 30 days  . metoprolol (TOPROL-XL) 200 MG 24 hr tablet Discontinued by provider  . rosuvastatin (CRESTOR) 5 MG tablet Discontinued by provider  . ALBUTEROL IN Entry Error  . 0.9 %  sodium chloride infusion   . potassium chloride 20 MEQ/15ML (10%) liquid 40 mEq   . enoxaparin (LOVENOX) injection 30 mg   . HYDROmorphone (DILAUDID) injection 1 mg   . oxyCODONE-acetaminophen (PERCOCET/ROXICET) 5-325 MG per tablet 1 tablet   .  insulin aspart protamine- aspart (NOVOLOG 70/30) injection 30 Units   . insulin aspart protamine- aspart (NOVOLOG 70/30) injection 40 Units Formulary change  . vancomycin (VANCOCIN) 1,750 mg in sodium chloride 0.9 % 500 mL IVPB Change in therapy    Social History:  reports that she has been smoking Cigarettes.  She has a 10 pack-year smoking history. She does not have any smokeless tobacco history on file. She reports that she does not drink alcohol or use illicit drugs.  Family History:   Family History  Problem Relation Age of Onset  . Diabetes Brother     Pertinent items are noted in HPI. Weight change:   Intake/Output Summary (Last 24 hours) at 02/03/13 1503 Last data filed at 02/03/13 1100  Gross per 24 hour  Intake   3365 ml  Output      0 ml  Net   3365 ml    General appearance: cooperative, fatigued and mild distress Head: Normocephalic, without obvious abnormality, atraumatic Eyes: negative findings: lids and lashes normal, conjunctivae and sclerae normal and corneas clear Neck: no adenopathy, no carotid bruit, no JVD, supple, symmetrical, trachea midline and thyroid not enlarged, symmetric, no tenderness/mass/nodules Resp: diminished breath sounds bibasilar Cardio: regular rate and rhythm, S1, S2 normal, no murmur, click, rub or gallop GI: soft, non-tender; bowel sounds normal; no masses,  no organomegaly Extremities: extremities normal, atraumatic, no cyanosis or edema  Skin: large area of induration, erythema, tenderness, and warmth on right gluteal area moving medially  Labs: Basic Metabolic Panel:  Recent Labs Lab 02/02/13 0850 02/02/13 1447 02/02/13 2102 02/03/13 0515  NA 130*  --  130* 128*  K 2.9*  --  3.8 4.1  CL 93*  --  97 96  CO2 22  --  20 20  GLUCOSE 231*  --  291* 291*  BUN 28*  --  35* 41*  CREATININE 1.77*  --  2.23* 2.40*  ALBUMIN 2.5*  --   --  1.9*  CALCIUM 9.6  --  8.7 8.8  PHOS  --  2.9  --   --    Liver Function Tests:  Recent  Labs Lab 02/02/13 0850 02/03/13 0515  AST 45* 36  ALT 41* 37*  ALKPHOS 123* 122*  BILITOT 1.2 1.0  PROT 7.0 5.8*  ALBUMIN 2.5* 1.9*    Recent Labs Lab 02/02/13 0850  LIPASE 17   No results found for this basename: AMMONIA,  in the last 168 hours CBC:  Recent Labs Lab 02/02/13 0850 02/03/13 0515  WBC 23.3* 20.3*  NEUTROABS 21.1*  --   HGB 11.4* 9.9*  HCT 33.9* 29.7*  MCV 78.5 78.0  PLT 209 193   PT/INR: @labrcntip (inr:5) Cardiac Enzymes:  Recent Labs Lab 02/02/13 0850  TROPONINI <0.30   CBG:  Recent Labs Lab 02/02/13 0820 02/02/13 1724 02/03/13 1344  GLUCAP 212* 245* 237*    Iron Studies: No results found for this basename: IRON, TIBC, TRANSFERRIN, FERRITIN,  in the last 168 hours  Xrays/Other Studies: Dg Chest 2 View  02/02/2013  *RADIOLOGY REPORT*  Clinical Data: 62 year old female with shortness of breath cough low grade fever diarrhea.  CHEST - 2 VIEW  Comparison: 07/31/2011 and earlier.  Findings: Semi upright AP and lateral views of the chest. Cardiomegaly not significantly changed. Other mediastinal contours are within normal limits.  Visualized tracheal air column is within normal limits.  No pneumothorax or pulmonary edema.  No pleural effusion or consolidation.  No confluent pulmonary opacity. No acute osseous abnormality identified.  IMPRESSION: Stable cardiomegaly.  No acute cardiopulmonary abnormality.   Original Report Authenticated By: Roselyn Reef, M.D.    US Pelvis Limited  02/02/2013  *RADIOLOGY REPORT*  Clinical Data: Left peroneal pain.  Gluteal mass.  Abscess.  US PELVIS LIMITED OR FOLLOW UP  Comparison: CT 02/02/2013.  Findings: Scanning was per formed over the posterior peroneal region and left medial gluteal region.  Subcutaneous edema is present without focal fluid collection.  No abscess.  IMPRESSION: Negative for abscess.  Subcutaneous edema, probably representing cellulitis.   Original Report Authenticated By: Dereck Ligas, M.D.     Ct Abdomen Pelvis W Contrast  02/02/2013  *RADIOLOGY REPORT*  Clinical Data: Abdominal pain and diarrhea.  CT ABDOMEN AND PELVIS WITH CONTRAST  Technique:  Multidetector CT imaging of the abdomen and pelvis was performed following the standard protocol during bolus administration of intravenous contrast.  Contrast: 87mL OMNIPAQUE IOHEXOL 300 MG/ML  SOLN  Comparison: None.  Findings: Lung bases are clear.  No evidence for free intraperitoneal air.  No gross abnormality to the liver, portal venous system or spleen. The gallbladder appears to be absent.  No gross abnormality to the pancreas.  There is a diffuse thickening and nodularity in the left adrenal gland.  Lateral left adrenal nodule measures up to 2.7 cm.  There is perinephric edema bilaterally, right side greater than left.  There is an exophytic  hyperdense structure along the posterior lower pole of the right kidney that measures 1.4 cm. There is an adjacent low density structure that measures 2.3 cm. Indeterminate 1.4 cm low density structure along the medial right kidney.  Suspect small cysts in the left kidney.  The patient has a right hip replacement that causes artifact in the pelvis.  However, there is a large amount of high density material along the posterior aspect of the bladder which is concerning for multiple stones or calcifications.  No significant free fluid or lymphadenopathy in the abdomen or pelvis.  There is a small ventral hernia containing fat.  No significant colon wall thickening.  No acute bony abnormality. There is subcutaneous edema in the left hemi pelvis.  There is no significant excretion from the kidneys on the delayed images.  The patient is noted to have creatinine level 1.77.  IMPRESSION: There is high density material along the posterior aspect of the bladder.  Differential diagnosis includes multiple bladder stones versus a calcified lesion.  Recommend urology consultation.  No excretion from the kidneys on the delayed  images.  This finding along with the elevated creatinine level raises concern for renal function impairment and recommend following renal function to exclude worsening renal function or ATN.  There are multiple low-density structures involving both kidneys that could represent cysts.  There is an exophytic hyperdense structure in the right kidney lower pole which could represent a complex or hemorrhagic cyst.  However, these renal structures are indeterminate.  There is also nodularity and enlargement of the left adrenal gland which could represent hyperplasia or adenomas but indeterminate on this postcontrast examination.  The adrenal and renal findings could be more definitively evaluated with MRI.   Original Report Authenticated By: Markus Daft, M.D.      Assessment/Plan: 1.  AKI/CKD- oliguric.  Pt was hypotensive, volume depleted and received IV contrast, therefore likely combo of ischemic ATN further c/b contrast-induced nephropathy.  Would hold any further vancomycin as she will likely only require qod dosing in light of worsening renal function and oliguria.  Also would recommend foley cath as she is currently incontinent on herself. 2. SIRS/Gluteal cellulitis worrisome for early fournier's gangrene.  Seen by [facility] and for possible surgery tonight or tomorrow am.  Cont with broad spectrum abx, renal dose 3. DM- per primary svc 4. HTN- now hypotensive with early SIRS. 5. Hypokalemia- repleted and would be careful not to overshoot correction. 6. Gout- stble 7. Hyponatremia- related to AKI 8. ABLA- guaiac stool and follow 9. Diarrhea- ?C. Diff or related to cellulitis/intra-abd process. 10. SIRS- cultures pending.   Berneda Piccininni A 02/03/2013, 3:03 PM

## 2013-02-03 NOTE — Consult Note (Signed)
I have seen and examined the patient and agree with the assessment and plans.  Anjelita Sheahan A. Gabriellah Rabel  MD, FACS  

## 2013-02-04 ENCOUNTER — Encounter (HOSPITAL_COMMUNITY)
Admission: EM | Disposition: A | Discharge status: Skilled Nursing Facility | Payer: Self-pay | Source: Home / Self Care | Attending: Internal Medicine

## 2013-02-04 ENCOUNTER — Encounter (HOSPITAL_COMMUNITY): Payer: Self-pay | Admitting: Anesthesiology

## 2013-02-04 ENCOUNTER — Inpatient Hospital Stay (HOSPITAL_COMMUNITY): Payer: PRIVATE HEALTH INSURANCE | Admitting: Anesthesiology

## 2013-02-04 DIAGNOSIS — R112 Nausea with vomiting, unspecified: Secondary | ICD-10-CM

## 2013-02-04 DIAGNOSIS — R197 Diarrhea, unspecified: Secondary | ICD-10-CM

## 2013-02-04 HISTORY — PX: INCISION AND DRAINAGE ABSCESS: SHX5864

## 2013-02-04 LAB — RENAL FUNCTION PANEL
Albumin: 1.7 g/dL — ABNORMAL LOW (ref 3.5–5.2)
BUN: 48 mg/dL — ABNORMAL HIGH (ref 6–23)
CO2: 19 mEq/L (ref 19–32)
Calcium: 9.3 mg/dL (ref 8.4–10.5)
Chloride: 98 mEq/L (ref 96–112)
Creatinine, Ser: 2.53 mg/dL — ABNORMAL HIGH (ref 0.50–1.10)
GFR calc Af Amer: 22 mL/min — ABNORMAL LOW (ref >90)
GFR calc non Af Amer: 19 mL/min — ABNORMAL LOW (ref >90)
Glucose, Bld: 242 mg/dL — ABNORMAL HIGH (ref 70–99)
Phosphorus: 4.1 mg/dL (ref 2.3–4.6)
Potassium: 4 mEq/L (ref 3.5–5.1)
Sodium: 130 mEq/L — ABNORMAL LOW (ref 135–145)

## 2013-02-04 LAB — CBC
HCT: 30.9 % — ABNORMAL LOW (ref 36.0–46.0)
Hemoglobin: 10.5 g/dL — ABNORMAL LOW (ref 12.0–15.0)
MCH: 26.5 pg (ref 26.0–34.0)
MCHC: 34 g/dL (ref 30.0–36.0)
MCV: 78 fL (ref 78.0–100.0)
Platelets: 212 10*3/uL (ref 150–400)
RBC: 3.96 MIL/uL (ref 3.87–5.11)
RDW: 16.2 % — ABNORMAL HIGH (ref 11.5–15.5)
WBC: 21.1 10*3/uL — ABNORMAL HIGH (ref 4.0–10.5)

## 2013-02-04 LAB — GLUCOSE, CAPILLARY
Glucose-Capillary: 277 mg/dL — ABNORMAL HIGH (ref 70–99)
Glucose-Capillary: 232 mg/dL — ABNORMAL HIGH (ref 70–99)
Glucose-Capillary: 228 mg/dL — ABNORMAL HIGH (ref 70–99)
Glucose-Capillary: 226 mg/dL — ABNORMAL HIGH (ref 70–99)
Glucose-Capillary: 218 mg/dL — ABNORMAL HIGH (ref 70–99)
Glucose-Capillary: 217 mg/dL — ABNORMAL HIGH (ref 70–99)
Glucose-Capillary: 218 mg/dL — ABNORMAL HIGH (ref 70–99)

## 2013-02-04 SURGERY — INCISION AND DRAINAGE, ABSCESS
Anesthesia: General | Site: Buttocks | Laterality: Left | Wound class: Dirty or Infected

## 2013-02-04 MED ORDER — METOCLOPRAMIDE HCL 5 MG/ML IJ SOLN
10.0000 mg | Freq: Once | INTRAMUSCULAR | Status: DC | PRN
  Filled 2013-02-04: qty 2

## 2013-02-04 MED ORDER — LACTATED RINGERS IV SOLN
INTRAVENOUS | Status: DC
  Administered 2013-02-04: 11:00:00 via INTRAVENOUS

## 2013-02-04 MED ORDER — BUPIVACAINE-EPINEPHRINE PF 0.5-1:200000 % IJ SOLN
INTRAMUSCULAR | Status: AC
  Filled 2013-02-04: qty 30

## 2013-02-04 MED ORDER — PROPOFOL 10 MG/ML IV BOLUS
INTRAVENOUS | Status: DC | PRN
  Administered 2013-02-04: 120 mg via INTRAVENOUS
  Administered 2013-02-04: 200 mg via INTRAVENOUS

## 2013-02-04 MED ORDER — OXYCODONE HCL 5 MG/5ML PO SOLN: 5.0000 mg | Freq: Once | ORAL | Status: DC | PRN

## 2013-02-04 MED ORDER — SODIUM CHLORIDE 0.9 % IV BOLUS (SEPSIS): 250.0000 mL | Freq: Once | INTRAVENOUS | Status: AC

## 2013-02-04 MED ORDER — HYDROMORPHONE HCL PF 1 MG/ML IJ SOLN
INTRAMUSCULAR | Status: AC
  Administered 2013-02-04: 0.5 mg via INTRAVENOUS
  Filled 2013-02-04: qty 1

## 2013-02-04 MED ORDER — PHENYLEPHRINE HCL 10 MG/ML IJ SOLN
INTRAMUSCULAR | Status: DC | PRN
  Administered 2013-02-04: 80 ug via INTRAVENOUS
  Administered 2013-02-04 (×2): 160 ug via INTRAVENOUS

## 2013-02-04 MED ORDER — PHENYLEPHRINE HCL 10 MG/ML IJ SOLN
10.0000 mg | INTRAMUSCULAR | Status: DC | PRN
  Administered 2013-02-04: 50 ug/min via INTRAVENOUS

## 2013-02-04 MED ORDER — LIDOCAINE HCL (CARDIAC) 20 MG/ML IV SOLN
INTRAVENOUS | Status: DC | PRN
  Administered 2013-02-04: 100 mg via INTRAVENOUS

## 2013-02-04 MED ORDER — HYDROMORPHONE HCL PF 1 MG/ML IJ SOLN
1.0000 mg | INTRAMUSCULAR | Status: DC | PRN
  Administered 2013-02-04 – 2013-02-06 (×4): 1 mg via INTRAVENOUS
  Filled 2013-02-04 (×4): qty 1

## 2013-02-04 MED ORDER — 0.9 % SODIUM CHLORIDE (POUR BTL) OPTIME
TOPICAL | Status: DC | PRN
  Administered 2013-02-04: 1000 mL

## 2013-02-04 MED ORDER — INSULIN ASPART 100 UNIT/ML ~~LOC~~ SOLN
0.0000 [IU] | Freq: Three times a day (TID) | SUBCUTANEOUS | Status: DC
  Administered 2013-02-05 (×2): 4 [IU] via SUBCUTANEOUS
  Administered 2013-02-05 – 2013-02-07 (×3): 3 [IU] via SUBCUTANEOUS
  Administered 2013-02-08 (×3): 4 [IU] via SUBCUTANEOUS
  Administered 2013-02-09: 7 [IU] via SUBCUTANEOUS

## 2013-02-04 MED ORDER — SUCCINYLCHOLINE CHLORIDE 20 MG/ML IJ SOLN
INTRAMUSCULAR | Status: DC | PRN
  Administered 2013-02-04: 140 mg via INTRAVENOUS

## 2013-02-04 MED ORDER — HYDROMORPHONE HCL PF 1 MG/ML IJ SOLN
0.2500 mg | INTRAMUSCULAR | Status: DC | PRN
  Administered 2013-02-04: 0.5 mg via INTRAVENOUS

## 2013-02-04 MED ORDER — ARTIFICIAL TEARS OP OINT
TOPICAL_OINTMENT | OPHTHALMIC | Status: DC | PRN
  Administered 2013-02-04: 1 via OPHTHALMIC

## 2013-02-04 MED ORDER — OXYCODONE HCL 5 MG PO TABS: 5.0000 mg | ORAL_TABLET | Freq: Once | ORAL | Status: DC | PRN

## 2013-02-04 MED ORDER — SODIUM CHLORIDE 0.9 % IV BOLUS (SEPSIS)
500.0000 mL | Freq: Once | INTRAVENOUS | Status: AC
  Administered 2013-02-04: 500 mL via INTRAVENOUS

## 2013-02-04 MED ORDER — BUPIVACAINE-EPINEPHRINE PF 0.5-1:200000 % IJ SOLN
INTRAMUSCULAR | Status: DC | PRN
  Administered 2013-02-04: 20 mL

## 2013-02-04 MED ORDER — INSULIN ASPART 100 UNIT/ML ~~LOC~~ SOLN
0.0000 [IU] | Freq: Every day | SUBCUTANEOUS | Status: DC
  Administered 2013-02-04: 2 [IU] via SUBCUTANEOUS

## 2013-02-04 MED ORDER — FENTANYL CITRATE 0.05 MG/ML IJ SOLN
INTRAMUSCULAR | Status: DC | PRN
  Administered 2013-02-04 (×2): 50 ug via INTRAVENOUS

## 2013-02-04 MED ORDER — INSULIN ASPART 100 UNIT/ML ~~LOC~~ SOLN
SUBCUTANEOUS | Status: AC
  Filled 2013-02-04: qty 1

## 2013-02-04 MED ORDER — ONDANSETRON HCL 4 MG/2ML IJ SOLN
INTRAMUSCULAR | Status: DC | PRN
  Administered 2013-02-04: 4 mg via INTRAVENOUS

## 2013-02-04 SURGICAL SUPPLY — 33 items
BANDAGE GAUZE ELAST BULKY 4 IN (GAUZE/BANDAGES/DRESSINGS) ×2 IMPLANT
BLADE SURG 15 STRL LF DISP TIS (BLADE) ×2 IMPLANT
BLADE SURG 15 STRL SS (BLADE) ×2
CANISTER SUCTION 2500CC (MISCELLANEOUS) ×2 IMPLANT
CLOTH BEACON ORANGE TIMEOUT ST (SAFETY) ×2 IMPLANT
COVER SURGICAL LIGHT HANDLE (MISCELLANEOUS) ×2 IMPLANT
DRSG PAD ABDOMINAL 8X10 ST (GAUZE/BANDAGES/DRESSINGS) ×4 IMPLANT
ELECT CAUTERY BLADE 6.4 (BLADE) ×2 IMPLANT
ELECT REM PT RETURN 9FT ADLT (ELECTROSURGICAL) ×2
ELECTRODE REM PT RTRN 9FT ADLT (ELECTROSURGICAL) ×1 IMPLANT
GAUZE PACKING IODOFORM 1 (PACKING) IMPLANT
GLOVE SURG SIGNA 7.5 PF LTX (GLOVE) ×2 IMPLANT
GOWN PREVENTION PLUS XLARGE (GOWN DISPOSABLE) ×2 IMPLANT
GOWN STRL NON-REIN LRG LVL3 (GOWN DISPOSABLE) ×2 IMPLANT
KIT BASIN OR (CUSTOM PROCEDURE TRAY) ×2 IMPLANT
KIT ROOM TURNOVER OR (KITS) ×2 IMPLANT
NEEDLE HYPO 25GX1X1/2 BEV (NEEDLE) ×2 IMPLANT
NS IRRIG 1000ML POUR BTL (IV SOLUTION) ×2 IMPLANT
PACK LITHOTOMY IV (CUSTOM PROCEDURE TRAY) ×2 IMPLANT
PAD ARMBOARD 7.5X6 YLW CONV (MISCELLANEOUS) ×2 IMPLANT
PENCIL BUTTON HOLSTER BLD 10FT (ELECTRODE) ×2 IMPLANT
SPONGE GAUZE 4X4 12PLY (GAUZE/BANDAGES/DRESSINGS) ×4 IMPLANT
SPONGE LAP 18X18 X RAY DECT (DISPOSABLE) ×4 IMPLANT
SWAB COLLECTION DEVICE MRSA (MISCELLANEOUS) ×2 IMPLANT
SYR BULB 3OZ (MISCELLANEOUS) ×2 IMPLANT
SYR CONTROL 10ML LL (SYRINGE) ×2 IMPLANT
TAPE CLOTH SURG 6X10 WHT LF (GAUZE/BANDAGES/DRESSINGS) ×4 IMPLANT
TOWEL OR 17X24 6PK STRL BLUE (TOWEL DISPOSABLE) ×2 IMPLANT
TOWEL OR 17X26 10 PK STRL BLUE (TOWEL DISPOSABLE) ×2 IMPLANT
TUBE ANAEROBIC SPECIMEN COL (MISCELLANEOUS) ×2 IMPLANT
TUBE CONNECTING 12X1/4 (SUCTIONS) ×2 IMPLANT
UNDERPAD 30X30 INCONTINENT (UNDERPADS AND DIAPERS) ×2 IMPLANT
YANKAUER SUCT BULB TIP NO VENT (SUCTIONS) ×2 IMPLANT

## 2013-02-04 NOTE — Op Note (Signed)
INCISION AND DRAINAGE L BUTTOCK ABSCESS  Procedure Note  [name] [name] 02/02/2013 - 02/04/2013   Pre-op Diagnosis: left buttock abscess, left breast abscess     Post-op Diagnosis: same  Procedure(s): INCISION AND DRAINAGE L BUTTOCK ABSCESS AND L BREAST ABSCESS  Surgeon(s): Harl Bowie, MD  Anesthesia: General  Staff:  Circulator: Laury Deep, RN Relief Scrub: Kennieth Francois, RN Scrub Person: Rise Mu, CST  Estimated Blood Loss: Minimal                         [name]   Date: 02/04/2013  Time: 12:27 PM

## 2013-02-04 NOTE — Progress Notes (Signed)
Pt. Received back from PACU drowsy but follows directions. Family member at bedside and settled and oriented to the unit.

## 2013-02-04 NOTE — Anesthesia Procedure Notes (Signed)
Procedure Name: Intubation Date/Time: 02/04/2013 11:52 AM Performed by: Sampson Si E Pre-anesthesia Checklist: Patient identified, Timeout performed, Emergency Drugs available, Suction available and Patient being monitored Patient Re-evaluated:Patient Re-evaluated prior to inductionOxygen Delivery Method: Circle system utilized Preoxygenation: Pre-oxygenation with 100% oxygen Intubation Type: IV induction, Rapid sequence and Cricoid Pressure applied Laryngoscope Size: Miller and 3 Grade View: Grade I Tube type: Oral Tube size: 7.5 mm Number of attempts: 1 Airway Equipment and Method: Stylet Placement Confirmation: ETT inserted through vocal cords under direct vision,  breath sounds checked- equal and bilateral and positive ETCO2 Secured at: 22 cm Tube secured with: Tape Dental Injury: Teeth and Oropharynx as per pre-operative assessment

## 2013-02-04 NOTE — Progress Notes (Signed)
Patient ID: [name] [name], female   DOB: Oct 02, 1951, 62 y.o.   MRN: [id number] S:complains of her lips sticking together and wants something to wipe on them O:BP 114/44  Pulse 99  Temp(Src) 98.1 F (36.7 C) (Oral)  Resp 13  Ht 5\' 4"  (1.626 m)  Wt 138.8 kg (306 lb)  BMI 52.5 kg/m2  SpO2 98%  Intake/Output Summary (Last 24 hours) at 02/04/13 0923 Last data filed at 02/04/13 0802  Gross per 24 hour  Intake   2950 ml  Output   1025 ml  Net   1925 ml   Intake/Output: I/O last 3 completed shifts: In: [id number] [P.O.:840; I.V.:3375; IV Piggyback:400] Out: 825 [Urine:825]  Intake/Output this shift:  Total I/O In: -  Out: 200 [Urine:200] Weight change: 4.989 kg (11 lb) Gen:WD obese AAF in mild distress, moaning in pain CVS:no rub Resp:cta [id number] Ext:no edema, left gluteal area more indurated, with serous drainage, more erythemetous and tender with foul odor emanating from secretions.  +fluctuance.   Recent Labs Lab 02/02/13 0850 02/02/13 1447 02/02/13 2102 02/03/13 0515 02/04/13 0358  NA 130*  --  130* 128* 130*  K 2.9*  --  3.8 4.1 4.0  CL 93*  --  97 96 98  CO2 22  --  20 20 19   GLUCOSE 231*  --  291* 291* 242*  BUN 28*  --  35* 41* 48*  CREATININE 1.77*  --  2.23* 2.40* 2.53*  ALBUMIN 2.5*  --   --  1.9* 1.7*  CALCIUM 9.6  --  8.7 8.8 9.3  PHOS  --  2.9  --   --  4.1  AST 45*  --   --  36  --   ALT 41*  --   --  37*  --    Liver Function Tests:  Recent Labs Lab 02/02/13 0850 02/03/13 0515 02/04/13 0358  AST 45* 36  --   ALT 41* 37*  --   ALKPHOS 123* 122*  --   BILITOT 1.2 1.0  --   PROT 7.0 5.8*  --   ALBUMIN 2.5* 1.9* 1.7*    Recent Labs Lab 02/02/13 0850  LIPASE 17   No results found for this basename: AMMONIA,  in the last 168 hours CBC:  Recent Labs Lab 02/02/13 0850 02/03/13 0515 02/04/13 0358  WBC 23.3* 20.3* 21.1*  NEUTROABS 21.1*  --   --   HGB 11.4* 9.9* 10.5*  HCT 33.9* 29.7* 30.9*  MCV 78.5 78.0 78.0  PLT 209 193 212    Cardiac Enzymes:  Recent Labs Lab 02/02/13 0850 02/03/13 1632  CKTOTAL  --  158  TROPONINI <0.30  --    CBG:  Recent Labs Lab 02/02/13 1724 02/03/13 1344 02/03/13 1739 02/03/13 2140 02/04/13 0752  GLUCAP 245* 237* 225* 247* 277*    Iron Studies: No results found for this basename: IRON, TIBC, TRANSFERRIN, FERRITIN,  in the last 72 hours Studies/Results: Dg Chest 2 View  02/02/2013  *RADIOLOGY REPORT*  Clinical Data: 62 year old female with shortness of breath cough low grade fever diarrhea.  CHEST - 2 VIEW  Comparison: 07/31/2011 and earlier.  Findings: Semi upright AP and lateral views of the chest. Cardiomegaly not significantly changed. Other mediastinal contours are within normal limits.  Visualized tracheal air column is within normal limits.  No pneumothorax or pulmonary edema.  No pleural effusion or consolidation.  No confluent pulmonary opacity. No acute osseous abnormality identified.  IMPRESSION: Stable cardiomegaly.  No acute cardiopulmonary abnormality.  Original Report Authenticated By: Roselyn Reef, M.D.    US Pelvis Limited  02/02/2013  *RADIOLOGY REPORT*  Clinical Data: Left peroneal pain.  Gluteal mass.  Abscess.  US PELVIS LIMITED OR FOLLOW UP  Comparison: CT 02/02/2013.  Findings: Scanning was per formed over the posterior peroneal region and left medial gluteal region.  Subcutaneous edema is present without focal fluid collection.  No abscess.  IMPRESSION: Negative for abscess.  Subcutaneous edema, probably representing cellulitis.   Original Report Authenticated By: Dereck Ligas, M.D.    Ct Abdomen Pelvis W Contrast  02/03/2013  **ADDENDUM** CREATED: 02/03/2013 17:18:17  The collection with calcifications in the pelvis is separate from the urinary bladder. Urinary bladder appears to be decompressed or compressed from this irregular fluid collection with calcifications.  Unfortunately, this area is very difficult to evaluate due to artifact from the right hip  replacement. This collection roughly measures 9.2 x 5.1 x 8.7 cm. There is significant edema in the subcutaneous tissues, particularly in the left gluteal region.  The differential diagnosis for this fluid collection includes an atypical abscess or hematoma. The high dense material probably represents calcifications and extravasated oral contrast is thought to be less likely but cannot be completely excluded.  Consider a follow-up pelvic CT without contrast to see if the etiology of this collection is more clear as the previously given oral contrast moves through the distal small bowel and colon.  These findings were discussed with Dr. Junious Silk of Urology and Dr. Wynelle Cleveland of Internal Medicine.  **END ADDENDUM** SIGNED BY: Markus Daft, M.D.   02/02/2013  *RADIOLOGY REPORT*  Clinical Data: Abdominal pain and diarrhea.  CT ABDOMEN AND PELVIS WITH CONTRAST  Technique:  Multidetector CT imaging of the abdomen and pelvis was performed following the standard protocol during bolus administration of intravenous contrast.  Contrast: 49mL OMNIPAQUE IOHEXOL 300 MG/ML  SOLN  Comparison: None.  Findings: Lung bases are clear.  No evidence for free intraperitoneal air.  No gross abnormality to the liver, portal venous system or spleen. The gallbladder appears to be absent.  No gross abnormality to the pancreas.  There is a diffuse thickening and nodularity in the left adrenal gland.  Lateral left adrenal nodule measures up to 2.7 cm.  There is perinephric edema bilaterally, right side greater than left.  There is an exophytic hyperdense structure along the posterior lower pole of the right kidney that measures 1.4 cm. There is an adjacent low density structure that measures 2.3 cm. Indeterminate 1.4 cm low density structure along the medial right kidney.  Suspect small cysts in the left kidney.  The patient has a right hip replacement that causes artifact in the pelvis.  However, there is a large amount of high density material  along the posterior aspect of the bladder which is concerning for multiple stones or calcifications.  No significant free fluid or lymphadenopathy in the abdomen or pelvis.  There is a small ventral hernia containing fat.  No significant colon wall thickening.  No acute bony abnormality. There is subcutaneous edema in the left hemi pelvis.  There is no significant excretion from the kidneys on the delayed images.  The patient is noted to have creatinine level 1.77.  IMPRESSION: There is high density material along the posterior aspect of the bladder.  Differential diagnosis includes multiple bladder stones versus a calcified lesion.  Recommend urology consultation.  No excretion from the kidneys on the delayed images.  This finding along with the elevated creatinine level raises  concern for renal function impairment and recommend following renal function to exclude worsening renal function or ATN.  There are multiple low-density structures involving both kidneys that could represent cysts.  There is an exophytic hyperdense structure in the right kidney lower pole which could represent a complex or hemorrhagic cyst.  However, these renal structures are indeterminate.  There is also nodularity and enlargement of the left adrenal gland which could represent hyperplasia or adenomas but indeterminate on this postcontrast examination.  The adrenal and renal findings could be more definitively evaluated with MRI.  Original Report Authenticated By: Markus Daft, M.D.    US Renal  02/03/2013  *RADIOLOGY REPORT*  Clinical Data: Acute renal insufficiency.  Diabetes.  RENAL/URINARY TRACT ULTRASOUND COMPLETE  Comparison:  CT 02/02/2013  Findings:  Right Kidney:  11.7 cm. No hydronephrosis.  Suspect mild increased renal echogenicity.  Normal renal cortical thickness.  2.6 cm interpolar right renal cyst.  Upper pole right renal cyst of 1.0 cm.  Left Kidney:  13.2 cm. No hydronephrosis.  Suspect mild increased echogenicity and  decreased cortical thickness.  A 1.3 cm inter/upper pole lesion which is markedly hypoechoic to anechoic. Suboptimally evaluated.  Bladder:  Decompressed.  A complex cystic process which was evaluated on yesterday's CT is suboptimally evaluated today.  IMPRESSION:  1. No hydronephrosis. 2.  Findings of medical renal disease, mild. 3.  Probable bilateral renal cysts.  Left-sided renal lesion which is suboptimally evaluated, secondary to patient body habitus. 4.  Cystic pelvic process, as detailed on the 02/02/2013 CT. Please see that report.   Original Report Authenticated By: Abigail Miyamoto, M.D.    . allopurinol  100 mg Oral Daily  . cilostazol  100 mg Oral BID  . [START ON 02/05/2013] enoxaparin (LOVENOX) injection  40 mg Subcutaneous Q24H  . insulin aspart  0-15 Units Subcutaneous QAC lunch  . insulin aspart  0-5 Units Subcutaneous QHS  . insulin aspart protamine- aspart  30 Units Subcutaneous Q supper  . insulin aspart protamine- aspart  40 Units Subcutaneous Q breakfast  . levothyroxine  150 mcg Oral QAC breakfast  . linezolid  600 mg Intravenous Q12H  . multivitamin with minerals  1 tablet Oral Daily  . pantoprazole  40 mg Oral Daily  . piperacillin-tazobactam (ZOSYN)  IV  3.375 g Intravenous Q8H    BMET    Component Value Date/Time   NA 130* 02/04/2013 0358   K 4.0 02/04/2013 0358   CL 98 02/04/2013 0358   CO2 19 02/04/2013 0358   GLUCOSE 242* 02/04/2013 0358   BUN 48* 02/04/2013 0358   CREATININE 2.53* 02/04/2013 0358   CALCIUM 9.3 02/04/2013 0358   GFRNONAA 19* 02/04/2013 0358   GFRAA 22* 02/04/2013 0358   CBC    Component Value Date/Time   WBC 21.1* 02/04/2013 0358   RBC 3.96 02/04/2013 0358   HGB 10.5* 02/04/2013 0358   HCT 30.9* 02/04/2013 0358   PLT 212 02/04/2013 0358   MCV 78.0 02/04/2013 0358   MCH 26.5 02/04/2013 0358   MCHC 34.0 02/04/2013 0358   RDW 16.2* 02/04/2013 0358   LYMPHSABS 1.0 02/02/2013 0850   MONOABS 1.2* 02/02/2013 0850   EOSABS 0.0 02/02/2013 0850   BASOSABS 0.0  02/02/2013 0850     Assessment/Plan:  1. AKI/CKD- non-oliguric (now that foley catheter was placed we can quantify UOP). Pt was hypotensive, volume depleted and received IV contrast, therefore likely combo of ischemic ATN further c/b contrast-induced nephropathy. Would hold any further vancomycin as she  will likely only require qod dosing in light of worsening renal function and oliguria. S/p foley cath as she was incontinent on herself. 2. SIRS- cultures negative thus far. Okay to cont with vanco per pharmacy dosing as random level was only 10. 3. Gluteal cellulitis worrisome for early fournier's gangrene. Seen by [facility] and for surgery today around 11 am. Cont with broad spectrum abx, renal dose. 4. DM- per primary svc 5. HTN- now hypotensive with early SIRS. 6. Hypokalemia- repleted and would be careful not to overshoot correction. 7. Gout- stble 8. Hyponatremia- related to AKI 9. ABLA- guaiac stool and follow 10. Diarrhea- ?C. Diff or related to cellulitis/intra-abd process. 11. SIRS- cultures pending. [place]

## 2013-02-04 NOTE — Transfer of Care (Signed)
Immediate Anesthesia Transfer of Care Note  Patient: [name] [name]  Procedure(s) Performed: Procedure(s): INCISION AND DRAINAGE L BUTTOCK ABSCESS (Left)  Patient Location: PACU  Anesthesia Type:General  Level of Consciousness: lethargic and responds to stimulation  Airway & Oxygen Therapy: Patient Spontanous Breathing and Patient connected to face mask oxygen  Post-op Assessment: Report given to PACU RN  Post vital signs: Reviewed and stable  Complications: No apparent anesthesia complications

## 2013-02-04 NOTE — Anesthesia Preprocedure Evaluation (Addendum)
Anesthesia Evaluation  Patient identified by MRN, date of birth, ID band Patient awake    Reviewed: Allergy & Precautions, H&P , NPO status , Patient's Chart, lab work & pertinent test results, reviewed documented beta blocker date and time   Airway Mallampati: II TM Distance: >3 FB Neck ROM: full    Dental  (+) Edentulous Upper, Partial Lower and Dental Advisory Given   Pulmonary neg pulmonary ROS,  breath sounds clear to auscultation        Cardiovascular hypertension, On Medications + Peripheral Vascular Disease Rhythm:regular Rate:Tachycardia     Neuro/Psych negative neurological ROS  negative psych ROS   GI/Hepatic Neg liver ROS, GERD-  Medicated and Controlled,  Endo/Other  negative endocrine ROSdiabetes, Insulin DependentMorbid obesity  Renal/GU CRF and ARFRenal disease  negative genitourinary   Musculoskeletal   Abdominal   Peds  Hematology negative hematology ROS (+)   Anesthesia Other Findings See surgeon's H&P   Reproductive/Obstetrics negative OB ROS                          Anesthesia Physical Anesthesia Plan  ASA: III  Anesthesia Plan: General   Post-op Pain Management:    Induction: Intravenous  Airway Management Planned: Oral ETT  Additional Equipment:   Intra-op Plan:   Post-operative Plan: Extubation in OR  Informed Consent: I have reviewed the patients History and Physical, chart, labs and discussed the procedure including the risks, benefits and alternatives for the proposed anesthesia with the patient or authorized representative who has indicated his/her understanding and acceptance.   Dental Advisory Given  Plan Discussed with: CRNA and Surgeon  Anesthesia Plan Comments:         Anesthesia Quick Evaluation

## 2013-02-04 NOTE — Anesthesia Postprocedure Evaluation (Signed)
  Anesthesia Post-op Note  Patient: [name] [name]  Procedure(s) Performed: Procedure(s): INCISION AND DRAINAGE LEFT BUTTOCK ABSCESS; INCISION AND DRAINAGE LEFT BREAST ABSCESS (Left)  Patient Location: PACU  Anesthesia Type:General  Level of Consciousness: awake, oriented and patient cooperative  Airway and Oxygen Therapy: Patient Spontanous Breathing  Post-op Pain: mild  Post-op Assessment: Post-op Vital signs reviewed, Patient's Cardiovascular Status Stable, Respiratory Function Stable, Patent Airway, No signs of Nausea or vomiting and Pain level controlled  Post-op Vital Signs: stable  Complications: No apparent anesthesia complications

## 2013-02-04 NOTE — Anesthesia Postprocedure Evaluation (Deleted)
  Anesthesia Post-op Note  Patient: [name] [name]  Procedure(s) Performed: Procedure(s): INCISION AND DRAINAGE LEFT BUTTOCK ABSCESS; INCISION AND DRAINAGE LEFT BREAST ABSCESS (Left)  Patient Location: PACU  Anesthesia Type:General  Level of Consciousness: awake, oriented and patient cooperative  Airway and Oxygen Therapy: Patient Spontanous Breathing  Post-op Pain: mild  Post-op Assessment: Post-op Vital signs reviewed, Patient's Cardiovascular Status Stable, Respiratory Function Stable, Patent Airway, No signs of Nausea or vomiting and Pain level controlled  Post-op Vital Signs: stable  Complications: No apparent anesthesia complications and Patient re-intubated

## 2013-02-04 NOTE — Progress Notes (Signed)
[facility] Progress Note [name] TEAM 1 - Stepdown/ICU TEAM   [name] [name] [id number] DOB: August 16, 1951 DOA: 02/02/2013 PCP: [name], MD  Brief narrative: 62 y.o. female with PMH of DM, Obesity, HTN, CKD 3, who presented with c/o diarrhea and a gluteal boil.  She c/o diarrhea for ~5 days, non bloodly and non bilious, associated with lower abdominal cramps, with h/o recent antibiotic use 2-3 weeks prior for UTI.  In addition she noted a boil of her L buttock /perineal region for 1 week which had progressively increased in size and was very painful.  She denied any drainage from it.  She denied any fevers, but did report chills.  In the ER she was noted to be tachycardic with HR 130s, BP was low as 73/49 (improved with IVF to low 100s), WBC of 23K and electrolyte abormalities.  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  - cont broad coverage until cultures dictate otherwise  DM  - CBGs poorly controlled at the present time - adjust treatment regimen and follow trend  ARF on CKD stage III - likely combo of ischemic ATN further c/b contrast-induced nephropathy - appreciate Nephro consult  Diarrhea - Resolved per patient  Hyponatremia - likely related to her acute renal failure as well as volume depletion - slowly improving  Hypokalemia - Resolved  Morbid obesity  Abnormal fluid and calcium collection in pelvis (noted via CT scan) will need repeat imaging this hospital stay to attempt to further define (MRI suggested by Urologist)  Gout Quiescent  Code Status: FULL Family Communication: Spoke to patient and multiple family members at bedside Disposition Plan: SDU  Consultants: Nephro Gen Surgery  Procedures: 4/30 - I&D L breast and L gluteal abscesses   Antibiotics: Vanc- 4/29 Zosyn- 4/29 >> Zyvox- 4/29  >>  DVT prophylaxis: Lovenox  HPI/Subjective: Is resting comfortably postop.  She denies shortness of breath chest pain or abdominal pain.  Objective: Blood pressure 111/62, pulse 105, temperature 98 F (36.7 C), temperature source Oral, resp. rate 18, height 5\' 4"  (1.626 m), weight 138.8 kg (306 lb), SpO2 100.00%.  Intake/Output Summary (Last 24 hours) at 02/04/13 1751 Last data filed at 02/04/13 1435  Gross per 24 hour  Intake   2315 ml  Output   1245 ml  Net   1070 ml   Exam: General: No acute respiratory distress Lungs: Clear to auscultation bilaterally without wheezes or crackles Cardiovascular: Regular rate and rhythm without murmur gallop or rub normal S1 and S2 Abdomen: Nontender, nondistended, soft, bowel sounds positive, no rebound, no ascites, no appreciable mass Extremities: No significant cyanosis, clubbing, or edema bilateral lower extremities  Data Reviewed: Basic Metabolic Panel:  Recent Labs Lab 02/02/13 0850 02/02/13 1447 02/02/13 2102 02/03/13 0515 02/04/13 0358  NA 130*  --  130* 128* 130*  K 2.9*  --  3.8 4.1 4.0  CL 93*  --  97 96 98  CO2 22  --  20 20 19   GLUCOSE 231*  --  291* 291* 242*  BUN 28*  --  35* 41* 48*  CREATININE 1.77*  --  2.23* 2.40* 2.53*  CALCIUM 9.6  --  8.7 8.8 9.3  MG  --  1.5  --   --   --   PHOS  --  2.9  --   --  4.1   Liver Function Tests:  Recent Labs Lab 02/02/13 0850 02/03/13 0515 02/04/13 0358  AST 45* 36  --   ALT 41* 37*  --   ALKPHOS 123* 122*  --   BILITOT 1.2 1.0  --   PROT 7.0 5.8*  --   ALBUMIN 2.5* 1.9* 1.7*    Recent Labs Lab 02/02/13 0850  LIPASE 17   CBC:  Recent Labs Lab 02/02/13 0850 02/03/13 0515 02/04/13 0358  WBC 23.3* 20.3* 21.1*  NEUTROABS 21.1*  --   --   HGB 11.4* 9.9* 10.5*  HCT 33.9* 29.7* 30.9*  MCV 78.5 78.0 78.0  PLT 209 193 212   Cardiac Enzymes:  Recent Labs Lab 02/02/13 0850 02/03/13 1632  CKTOTAL  --  158  TROPONINI <0.30  --    CBG:  Recent  Labs Lab 02/04/13 1055 02/04/13 1242 02/04/13 1352 02/04/13 1432 02/04/13 1708  GLUCAP 232* 228* 226* 218* 217*    Recent Results (from the past 240 hour(s))  CULTURE, BLOOD (ROUTINE X 2)     Status: None   Collection Time    02/02/13 10:27 AM      Result Value Range Status   Specimen Description BLOOD FOREARM RIGHT   Final   Special Requests BOTTLES DRAWN AEROBIC ONLY 5CC   Final   Culture  Setup Time 02/02/2013 15:05   Final   Culture     Final   Value:        BLOOD CULTURE RECEIVED NO GROWTH TO DATE CULTURE WILL BE HELD FOR 5 DAYS BEFORE ISSUING A FINAL NEGATIVE REPORT   Report Status PENDING   Incomplete  CULTURE, BLOOD (ROUTINE X 2)     Status: None   Collection Time    02/02/13 10:35 AM      Result Value Range Status   Specimen Description BLOOD HAND RIGHT   Final   Special Requests BOTTLES DRAWN AEROBIC AND ANAEROBIC 10CC   Final   Culture  Setup Time 02/02/2013 15:05   Final   Culture     Final   Value:        BLOOD CULTURE RECEIVED NO GROWTH TO DATE CULTURE WILL BE HELD FOR 5 DAYS BEFORE ISSUING A FINAL NEGATIVE REPORT   Report Status PENDING   Incomplete  URINE CULTURE     Status: None   Collection Time    02/02/13 12:23 PM      Result Value Range Status   Specimen Description URINE, CATHETERIZED   Final   Special Requests ADDED [id number]   Final   Culture  Setup Time 02/02/2013 22:37   Final   [place] Count NO GROWTH   Final   Culture NO GROWTH   Final   Report Status 02/03/2013 FINAL   Final     Studies:  Recent x-ray studies have been reviewed in detail by the Attending Physician  Scheduled Meds:  Scheduled Meds: . allopurinol  100 mg Oral Daily  . cilostazol  100 mg Oral BID  . [START ON 02/05/2013] enoxaparin (LOVENOX) injection  40 mg Subcutaneous Q24H  . insulin aspart      . insulin aspart  0-15 Units Subcutaneous QAC lunch  . insulin aspart  0-5 Units Subcutaneous QHS  . insulin aspart protamine- aspart  30 Units Subcutaneous Q supper  .  insulin aspart protamine- aspart  40 Units Subcutaneous Q breakfast  . levothyroxine  150 mcg Oral QAC breakfast  . linezolid  600 mg Intravenous Q12H  .  multivitamin with minerals  1 tablet Oral Daily  . pantoprazole  40 mg Oral Daily  . piperacillin-tazobactam (ZOSYN)  IV  3.375 g Intravenous Q8H   Continuous Infusions: . sodium chloride 125 mL/hr (02/04/13 1622)  . lactated ringers 50 mL/hr at 02/04/13 1100    Time spent on care of this patient: 35 min   [place]  [phone] Pager - Text Page per Shea Evans as per below:  On-Call/Text Page:      [email or web address]      password [email or web address]  If 7PM-7AM, please contact night-coverage [email or web address] Password [email or web address] 02/04/2013, 5:51 PM   LOS: 2 days

## 2013-02-05 DIAGNOSIS — A419 Sepsis, unspecified organism: Secondary | ICD-10-CM

## 2013-02-05 DIAGNOSIS — L0291 Cutaneous abscess, unspecified: Secondary | ICD-10-CM

## 2013-02-05 LAB — GLUCOSE, CAPILLARY
Glucose-Capillary: 160 mg/dL — ABNORMAL HIGH (ref 70–99)
Glucose-Capillary: 143 mg/dL — ABNORMAL HIGH (ref 70–99)
Glucose-Capillary: 168 mg/dL — ABNORMAL HIGH (ref 70–99)
Glucose-Capillary: 99 mg/dL (ref 70–99)

## 2013-02-05 LAB — BASIC METABOLIC PANEL
BUN: 53 mg/dL — ABNORMAL HIGH (ref 6–23)
CO2: 21 mEq/L (ref 19–32)
Calcium: 9.1 mg/dL (ref 8.4–10.5)
Chloride: 99 mEq/L (ref 96–112)
Creatinine, Ser: 2.79 mg/dL — ABNORMAL HIGH (ref 0.50–1.10)
GFR calc Af Amer: 20 mL/min — ABNORMAL LOW (ref >90)
GFR calc non Af Amer: 17 mL/min — ABNORMAL LOW (ref >90)
Glucose, Bld: 191 mg/dL — ABNORMAL HIGH (ref 70–99)
Potassium: 3.8 mEq/L (ref 3.5–5.1)
Sodium: 130 mEq/L — ABNORMAL LOW (ref 135–145)

## 2013-02-05 LAB — CBC
HCT: 27.4 % — ABNORMAL LOW (ref 36.0–46.0)
Hemoglobin: 9.2 g/dL — ABNORMAL LOW (ref 12.0–15.0)
MCH: 26.1 pg (ref 26.0–34.0)
MCHC: 33.6 g/dL (ref 30.0–36.0)
MCV: 77.6 fL — ABNORMAL LOW (ref 78.0–100.0)
Platelets: 249 10*3/uL (ref 150–400)
RBC: 3.53 MIL/uL — ABNORMAL LOW (ref 3.87–5.11)
RDW: 16.3 % — ABNORMAL HIGH (ref 11.5–15.5)
WBC: 23.4 10*3/uL — ABNORMAL HIGH (ref 4.0–10.5)

## 2013-02-05 LAB — URINE CULTURE
Colony Count: NO GROWTH
Culture: NO GROWTH

## 2013-02-05 LAB — HEMOGLOBIN A1C
Hgb A1c MFr Bld: 7.6 % — ABNORMAL HIGH (ref <5.7)
Mean Plasma Glucose: 171 mg/dL — ABNORMAL HIGH (ref <117)

## 2013-02-05 MED ORDER — INSULIN ASPART PROT & ASPART (70-30 MIX) 100 UNIT/ML ~~LOC~~ SUSP
34.0000 [IU] | Freq: Every day | SUBCUTANEOUS | Status: DC
  Administered 2013-02-05 – 2013-02-06 (×2): 34 [IU] via SUBCUTANEOUS

## 2013-02-05 MED ORDER — DARIFENACIN HYDROBROMIDE ER 7.5 MG PO TB24
7.5000 mg | ORAL_TABLET | Freq: Every day | ORAL | Status: DC
  Administered 2013-02-05 – 2013-02-20 (×14): 7.5 mg via ORAL
  Filled 2013-02-05 (×16): qty 1

## 2013-02-05 NOTE — Progress Notes (Signed)
1 Day Post-Op  Subjective: C/o post op pain  Objective: Vital signs in last 24 hours: Temp:  [97.7 F (36.5 C)-98.9 F (37.2 C)] 98.3 F (36.8 C) (05/01 0400) Pulse Rate:  [54-109] 102 (04/30 2315) Resp:  [11-23] 17 (05/01 0400) BP: (89-144)/(33-90) 100/33 mmHg (05/01 0400) SpO2:  [90 %-100 %] 100 % (05/01 0400) Weight:  [317 lb 7.4 oz (144 kg)] 317 lb 7.4 oz (144 kg) (05/01 0434) Last BM Date: 02/02/13  Intake/Output from previous day: 04/30 0701 - 05/01 0700 In: 3775 [P.O.:300; I.V.:3475] Out: 870 [Urine:850; Blood:20] Intake/Output this shift:    Packing in place Still with swelling in left labial area  Lab Results:   Recent Labs  02/04/13 0358 02/05/13 0415  WBC 21.1* 23.4*  HGB 10.5* 9.2*  HCT 30.9* 27.4*  PLT 212 249   BMET  Recent Labs  02/04/13 0358 02/05/13 0415  NA 130* 130*  K 4.0 3.8  CL 98 99  CO2 19 21  GLUCOSE 242* 191*  BUN 48* 53*  CREATININE 2.53* 2.79*  CALCIUM 9.3 9.1   PT/INR No results found for this basename: LABPROT, INR,  in the last 72 hours ABG No results found for this basename: PHART, PCO2, PO2, HCO3,  in the last 72 hours  Studies/Results: US Renal  02/03/2013  *RADIOLOGY REPORT*  Clinical Data: Acute renal insufficiency.  Diabetes.  RENAL/URINARY TRACT ULTRASOUND COMPLETE  Comparison:  CT 02/02/2013  Findings:  Right Kidney:  11.7 cm. No hydronephrosis.  Suspect mild increased renal echogenicity.  Normal renal cortical thickness.  2.6 cm interpolar right renal cyst.  Upper pole right renal cyst of 1.0 cm.  Left Kidney:  13.2 cm. No hydronephrosis.  Suspect mild increased echogenicity and decreased cortical thickness.  A 1.3 cm inter/upper pole lesion which is markedly hypoechoic to anechoic. Suboptimally evaluated.  Bladder:  Decompressed.  A complex cystic process which was evaluated on yesterday's CT is suboptimally evaluated today.  IMPRESSION:  1. No hydronephrosis. 2.  Findings of medical renal disease, mild. 3.  Probable  bilateral renal cysts.  Left-sided renal lesion which is suboptimally evaluated, secondary to patient body habitus. 4.  Cystic pelvic process, as detailed on the 02/02/2013 CT. Please see that report.   Original Report Authenticated By: Abigail Miyamoto, M.D.     Anti-infectives: Anti-infectives   Start     Dose/Rate Route Frequency Ordered Stop   02/03/13 1500  linezolid (ZYVOX) IVPB 600 mg     600 mg 300 mL/hr over 60 Minutes Intravenous Every 12 hours 02/03/13 1353     02/03/13 1400  vancomycin (VANCOCIN) 1,750 mg in sodium chloride 0.9 % 500 mL IVPB  Status:  Discontinued     1,750 mg 250 mL/hr over 120 Minutes Intravenous Every 24 hours 02/02/13 1356 02/03/13 1443   02/02/13 1730  piperacillin-tazobactam (ZOSYN) IVPB 3.375 g     3.375 g 12.5 mL/hr over 240 Minutes Intravenous Every 8 hours 02/02/13 1646     02/02/13 1400  vancomycin (VANCOCIN) 2,000 mg in sodium chloride 0.9 % 500 mL IVPB     2,000 mg 250 mL/hr over 120 Minutes Intravenous  Once 02/02/13 1355 02/02/13 1701      Assessment/Plan: s/p Procedure(s): INCISION AND DRAINAGE LEFT BUTTOCK ABSCESS; INCISION AND DRAINAGE LEFT BREAST ABSCESS (Left)  Will return to OR for dressing change tomorrow and see if any further debridement needed  LOS: 3 days    Oney Folz A 02/05/2013

## 2013-02-05 NOTE — Progress Notes (Signed)
Utilization review completed.  

## 2013-02-05 NOTE — Progress Notes (Signed)
Patient ID: [name] [name], female   DOB: 02/05/1951, 62 y.o.   MRN: [id number] S:feels better O:BP 138/40  Pulse 102  Temp(Src) 97.8 F (36.6 C) (Oral)  Resp 17  Ht 5\' 4"  (1.626 m)  Wt 144 kg (317 lb 7.4 oz)  BMI 54.47 kg/m2  SpO2 100%  Intake/Output Summary (Last 24 hours) at 02/05/13 1126 Last data filed at 02/05/13 0815  Gross per 24 hour  Intake   4675 ml  Output    870 ml  Net   3805 ml   Intake/Output: I/O last 3 completed shifts: In: 5200 [P.O.:300; I.V.:4850; IV Piggyback:50] Out: [id number] [Urine:1675; Blood:20]  Intake/Output this shift:  Total I/O In: [id number] [P.O.:600; I.V.:125; IV Piggyback:50] Out: 200 [Urine:200] Weight change: 5.2 kg (11 lb 7.4 oz) Gen:WD morbidly obese AAF, who appears fatigued but in NAD [id number] Resp:cta [id number], NT Ext:+right gluteal induration/tenderness. No pretib edema   Recent Labs Lab 02/02/13 0850 02/02/13 1447 02/02/13 2102 02/03/13 0515 02/04/13 0358 02/05/13 0415  NA 130*  --  130* 128* 130* 130*  K 2.9*  --  3.8 4.1 4.0 3.8  CL 93*  --  97 96 98 99  CO2 22  --  20 20 19 21   GLUCOSE 231*  --  291* 291* 242* 191*  BUN 28*  --  35* 41* 48* 53*  CREATININE 1.77*  --  2.23* 2.40* 2.53* 2.79*  ALBUMIN 2.5*  --   --  1.9* 1.7*  --   CALCIUM 9.6  --  8.7 8.8 9.3 9.1  PHOS  --  2.9  --   --  4.1  --   AST 45*  --   --  36  --   --   ALT 41*  --   --  37*  --   --    Liver Function Tests:  Recent Labs Lab 02/02/13 0850 02/03/13 0515 02/04/13 0358  AST 45* 36  --   ALT 41* 37*  --   ALKPHOS 123* 122*  --   BILITOT 1.2 1.0  --   PROT 7.0 5.8*  --   ALBUMIN 2.5* 1.9* 1.7*    Recent Labs Lab 02/02/13 0850  LIPASE 17   No results found for this basename: AMMONIA,  in the last 168 hours CBC:  Recent Labs Lab 02/02/13 0850 02/03/13 0515 02/04/13 0358 02/05/13 0415  WBC 23.3* 20.3* 21.1* 23.4*  NEUTROABS 21.1*  --   --   --   HGB 11.4* 9.9* 10.5* 9.2*  HCT 33.9* 29.7* 30.9* 27.4*  MCV 78.5 78.0 78.0 77.6*   PLT 209 193 212 249   Cardiac Enzymes:  Recent Labs Lab 02/02/13 0850 02/03/13 1632  CKTOTAL  --  158  TROPONINI <0.30  --    CBG:  Recent Labs Lab 02/04/13 1352 02/04/13 1432 02/04/13 1708 02/04/13 2155 02/05/13 0807  GLUCAP 226* 218* 217* 218* 160*    Iron Studies: No results found for this basename: IRON, TIBC, TRANSFERRIN, FERRITIN,  in the last 72 hours Studies/Results: US Renal  02/03/2013  *RADIOLOGY REPORT*  Clinical Data: Acute renal insufficiency.  Diabetes.  RENAL/URINARY TRACT ULTRASOUND COMPLETE  Comparison:  CT 02/02/2013  Findings:  Right Kidney:  11.7 cm. No hydronephrosis.  Suspect mild increased renal echogenicity.  Normal renal cortical thickness.  2.6 cm interpolar right renal cyst.  Upper pole right renal cyst of 1.0 cm.  Left Kidney:  13.2 cm. No hydronephrosis.  Suspect mild increased echogenicity and decreased cortical thickness.  A 1.3 cm inter/upper pole lesion which is markedly hypoechoic to anechoic. Suboptimally evaluated.  Bladder:  Decompressed.  A complex cystic process which was evaluated on yesterday's CT is suboptimally evaluated today.  IMPRESSION:  1. No hydronephrosis. 2.  Findings of medical renal disease, mild. 3.  Probable bilateral renal cysts.  Left-sided renal lesion which is suboptimally evaluated, secondary to patient body habitus. 4.  Cystic pelvic process, as detailed on the 02/02/2013 CT. Please see that report.   Original Report Authenticated By: Abigail Miyamoto, M.D.    . allopurinol  100 mg Oral Daily  . cilostazol  100 mg Oral BID  . enoxaparin (LOVENOX) injection  40 mg Subcutaneous Q24H  . insulin aspart  0-20 Units Subcutaneous TID WC  . insulin aspart  0-5 Units Subcutaneous QHS  . insulin aspart protamine- aspart  30 Units Subcutaneous Q supper  . insulin aspart protamine- aspart  40 Units Subcutaneous Q breakfast  . levothyroxine  150 mcg Oral QAC breakfast  . linezolid  600 mg Intravenous Q12H  . multivitamin with minerals   1 tablet Oral Daily  . pantoprazole  40 mg Oral Daily  . piperacillin-tazobactam (ZOSYN)  IV  3.375 g Intravenous Q8H    BMET    Component Value Date/Time   NA 130* 02/05/2013 0415   K 3.8 02/05/2013 0415   CL 99 02/05/2013 0415   CO2 21 02/05/2013 0415   GLUCOSE 191* 02/05/2013 0415   BUN 53* 02/05/2013 0415   CREATININE 2.79* 02/05/2013 0415   CALCIUM 9.1 02/05/2013 0415   GFRNONAA 17* 02/05/2013 0415   GFRAA 20* 02/05/2013 0415   CBC    Component Value Date/Time   WBC 23.4* 02/05/2013 0415   RBC 3.53* 02/05/2013 0415   HGB 9.2* 02/05/2013 0415   HCT 27.4* 02/05/2013 0415   PLT 249 02/05/2013 0415   MCV 77.6* 02/05/2013 0415   MCH 26.1 02/05/2013 0415   MCHC 33.6 02/05/2013 0415   RDW 16.3* 02/05/2013 0415   LYMPHSABS 1.0 02/02/2013 0850   MONOABS 1.2* 02/02/2013 0850   EOSABS 0.0 02/02/2013 0850   BASOSABS 0.0 02/02/2013 0850     Assessment/Plan:  1. AKI/CKD- non-oliguric (now that foley catheter was placed we can quantify UOP). Pt was hypotensive, volume depleted and received IV contrast, therefore likely combo of ischemic ATN further c/b contrast-induced nephropathy. Would hold any further vancomycin as she will likely only require qod dosing in light of worsening renal function and oliguria. S/p foley cath as she was incontinent on herself. No urgent indication for HD. Will cont to follow closely 2. SIRS- GPC in pairs from gluteal abscess.  Urine and blood cultures negative thus far. Okay to cont with vanco per pharmacy dosing as random level was only 10. 3. Gluteal abscess.  S/p I&D 4. DM- per primary svc 5. HTN- now hypotensive with early SIRS. 6. Hypokalemia- repleted and would be careful not to overshoot correction. 7. Gout- stble 8. Hyponatremia- related to AKI 9. ABLA- guaiac stool and follow 10. Diarrhea- ?C. Diff or related to cellulitis/intra-abd process. [facility]

## 2013-02-05 NOTE — Progress Notes (Signed)
[facility] Progress Note [place] TEAM 1 - Stepdown/ICU TEAM   [name] [name] [id number] DOB: [birth year] DOA: 02/02/2013 PCP: [name], MD  Brief narrative: 62 y.o. female with PMH of DM, Obesity, HTN, CKD 3, who presented with c/o diarrhea and a gluteal boil.  She c/o diarrhea for ~5 days, non bloodly and non bilious, associated with lower abdominal cramps, with h/o recent antibiotic use 2-3 weeks prior for UTI.  In addition she noted a boil of her L buttock /perineal region for 1 week which had progressively increased in size and was very painful.  She denied any drainage from it.  She denied any fevers, but did report chills.  In the ER she was noted to be tachycardic with HR 130s, BP was low as 73/49 (improved with IVF to low 100s), WBC of 23K and electrolyte abormalities.  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 No significant change since admission-  Hypokalemia - Resolved  Morbid obesity  Abnormal fluid and calcium collection in pelvis (noted via CT scan) will need repeat imaging this hospital stay to attempt to further define (MRI suggested by Urologist)  Gout Quiescent  Code Status: FULL Family Communication: Spoke to patient  Disposition Plan: transfer to surgical floor  Consultants: Nephro Gen Surgery  Procedures: 4/30 - I&D L breast and L gluteal abscesses    Antibiotics: Vanc- 4/29 Zosyn- 4/29 >> Zyvox- 4/29 >>  DVT prophylaxis: Lovenox  HPI/Subjective: Is awake- pain is controlled- taking clears- willing to advance diet.   Objective: Blood pressure 138/40, pulse 102, temperature 98.3 F (36.8 C), temperature source Oral, resp. rate 17, height 5\' 4"  (1.626 m), weight 144 kg (317 lb 7.4 oz), SpO2 100.00%.  Intake/Output Summary (Last 24 hours) at 02/05/13 1458 Last data filed at 02/05/13 1226  Gross per 24 hour  Intake   4075 ml  Output    800 ml  Net   3275 ml   Exam: General: No acute respiratory distress Lungs: Clear to auscultation bilaterally without wheezes or crackles Cardiovascular: Regular rate and rhythm without murmur gallop or rub normal S1 and S2 Abdomen: Nontender, nondistended, soft, bowel sounds positive, no rebound, no ascites, no appreciable mass Extremities: No significant cyanosis, clubbing, or edema bilateral lower extremities- left buttock dressing appears bloody- not removed.   Data Reviewed: Basic Metabolic Panel:  Recent Labs Lab 02/02/13 0850 02/02/13 1447 02/02/13 2102 02/03/13 0515 02/04/13 0358 02/05/13 0415  NA 130*  --  130* 128* 130* 130*  K 2.9*  --  3.8 4.1 4.0 3.8  CL 93*  --  97 96 98 99  CO2 22  --  20 20 19 21   GLUCOSE 231*  --  291* 291* 242* 191*  BUN 28*  --  35* 41* 48* 53*  CREATININE 1.77*  --  2.23* 2.40* 2.53* 2.79*  CALCIUM 9.6  --  8.7 8.8 9.3  9.1  MG  --  1.5  --   --   --   --   PHOS  --  2.9  --   --  4.1  --    Liver Function Tests:  Recent Labs Lab 02/02/13 0850 02/03/13 0515 02/04/13 0358  AST 45* 36  --   ALT 41* 37*  --   ALKPHOS 123* 122*  --   BILITOT 1.2 1.0  --   PROT 7.0 5.8*  --   ALBUMIN 2.5* 1.9* 1.7*    Recent Labs Lab 02/02/13 0850  LIPASE 17   CBC:  Recent Labs Lab 02/02/13 0850 02/03/13 0515 02/04/13 0358 02/05/13 0415  WBC 23.3* 20.3* 21.1* 23.4*  NEUTROABS 21.1*  --   --   --   HGB 11.4* 9.9* 10.5* 9.2*  HCT 33.9*  29.7* 30.9* 27.4*  MCV 78.5 78.0 78.0 77.6*  PLT 209 193 212 249   Cardiac Enzymes:  Recent Labs Lab 02/02/13 0850 02/03/13 1632  CKTOTAL  --  158  TROPONINI <0.30  --    CBG:  Recent Labs Lab 02/04/13 1432 02/04/13 1708 02/04/13 2155 02/05/13 0807 02/05/13 1222  GLUCAP 218* 217* 218* 160* 143*    Recent Results (from the past 240 hour(s))  CULTURE, BLOOD (ROUTINE X 2)     Status: None   Collection Time    02/02/13 10:27 AM      Result Value Range Status   Specimen Description BLOOD FOREARM RIGHT   Final   Special Requests BOTTLES DRAWN AEROBIC ONLY 5CC   Final   Culture  Setup Time 02/02/2013 15:05   Final   Culture     Final   Value:        BLOOD CULTURE RECEIVED NO GROWTH TO DATE CULTURE WILL BE HELD FOR 5 DAYS BEFORE ISSUING A FINAL NEGATIVE REPORT   Report Status PENDING   Incomplete  CULTURE, BLOOD (ROUTINE X 2)     Status: None   Collection Time    02/02/13 10:35 AM      Result Value Range Status   Specimen Description BLOOD HAND RIGHT   Final   Special Requests BOTTLES DRAWN AEROBIC AND ANAEROBIC 10CC   Final   Culture  Setup Time 02/02/2013 15:05   Final   Culture     Final   Value:        BLOOD CULTURE RECEIVED NO GROWTH TO DATE CULTURE WILL BE HELD FOR 5 DAYS BEFORE ISSUING A FINAL NEGATIVE REPORT   Report Status PENDING   Incomplete  URINE CULTURE     Status: None   Collection Time    02/02/13 12:23 PM      Result Value Range Status   Specimen Description URINE, CATHETERIZED   Final   Special Requests ADDED [id number]   Final   Culture  Setup Time 02/02/2013 22:37   Final   Colony Count NO GROWTH   Final   Culture NO GROWTH   Final   Report Status 02/03/2013 FINAL   Final  URINE CULTURE     Status: None   Collection Time    02/03/13  6:08 PM      Result Value Range Status   Specimen Description URINE, CATHETERIZED   Final   Special Requests NONE   Final   Culture  Setup Time 02/03/2013 20:04   Final   Colony Count NO GROWTH   Final    Culture NO GROWTH   Final   Report  Status 02/05/2013 FINAL   Final  CULTURE, ROUTINE-ABSCESS     Status: None   Collection Time    02/04/13 12:11 PM      Result Value Range Status   Specimen Description ABSCESS   Final   Special Requests LEFT GLUTEAL PT ON ZOSYN   Final   Gram Stain     Final   Value: FEW WBC PRESENT,BOTH PMN AND MONONUCLEAR     NO SQUAMOUS EPITHELIAL CELLS SEEN     MODERATE GRAM POSITIVE COCCI     IN PAIRS   Culture NO GROWTH 1 DAY   Final   Report Status PENDING   Incomplete  ANAEROBIC CULTURE     Status: None   Collection Time    02/04/13 12:11 PM      Result Value Range Status   Specimen Description ABSCESS   Final   Special Requests LEFT GLUTEAL PT ON ZOSYN   Final   Gram Stain PENDING   Incomplete   Culture     Final   Value: NO ANAEROBES ISOLATED; CULTURE IN PROGRESS FOR 5 DAYS   Report Status PENDING   Incomplete     Studies:  Recent x-ray studies have been reviewed in detail by the Attending Physician  Scheduled Meds:  Scheduled Meds: . allopurinol  100 mg Oral Daily  . cilostazol  100 mg Oral BID  . enoxaparin (LOVENOX) injection  40 mg Subcutaneous Q24H  . insulin aspart  0-20 Units Subcutaneous TID WC  . insulin aspart  0-5 Units Subcutaneous QHS  . insulin aspart protamine- aspart  30 Units Subcutaneous Q supper  . insulin aspart protamine- aspart  40 Units Subcutaneous Q breakfast  . levothyroxine  150 mcg Oral QAC breakfast  . linezolid  600 mg Intravenous Q12H  . multivitamin with minerals  1 tablet Oral Daily  . pantoprazole  40 mg Oral Daily  . piperacillin-tazobactam (ZOSYN)  IV  3.375 g Intravenous Q8H   Continuous Infusions: . sodium chloride 125 mL/hr at 02/04/13 1800    Time spent on care of this patient: 25 min   Debbe Odea, [place]  [facility] Office  [phone] Pager - Text Page per Amion as per below:  On-Call/Text Page:      [email or web address]      password [email or web address]  If 7PM-7AM, please contact  night-coverage [email or web address] Password [email or web address] 02/05/2013, 2:58 PM   LOS: 3 days

## 2013-02-05 NOTE — Op Note (Signed)
[name], [name]             ACCOUNT NO.:  [id number]  MEDICAL RECORD NO.:  [id number]  LOCATION:  [id number]                         FACILITY:  [facility]  PHYSICIAN:  [name], M.D. DATE OF BIRTH:  1951/09/18  DATE OF PROCEDURE:  02/04/2013 DATE OF DISCHARGE:                              OPERATIVE REPORT   PREOPERATIVE DIAGNOSES: 1. Left buttock abscess. 2. Left breast abscess.  POSTOPERATIVE DIAGNOSES: 1. Left buttock abscess. 2. Left breast abscess.  PROCEDURE: 1. Incision and drainage of left buttock abscess. 2. Incision and drainage of left breast abscess.  SURGEON:  [name], M.D.  ANESTHESIA:  General and 0.5% Marcaine.  ESTIMATED BLOOD LOSS:  Minimal.  FINDINGS:  The patient was found to have a very large abscess in the left buttock moving towards the rectum as well as up toward the labia. There was necrotic tissue which was excised with the cautery.  The breast abscess was superficial.  PROCEDURE IN DETAIL:  The patient was brought to the operating, identified as [name] [name].  She was placed on the table and general anesthesia was induced.  The patient was then placed in lithotomy position.  Her perianal area was then prepped and draped in the usual sterile fashion.  I made a large incision with a scalpel over the top of the area of fluctuance which had been already draining, and I entered a very large abscess cavity that had a lot of foul-smelling purulent material inside.  I drained all the material out, some necrotic subcutaneous tissue and fascia and fat were excised with electrocautery. This did appear to track as well toward the rectum and toward the labia. I had extended the incision both posteriorly and anteriorly.  Once all the purulence was removed.  I irrigated the wound with saline.  I then packed it with a dry Kerlix for hemostasis.  Dry gauze were applied. The patient had a very superficial tiny abscess on her left breast.  I  unroofed this with a scalpel and then packed it with the edge of a 4 x 4 gauze.  The patient tolerated the procedure well.  All counts were correct at the end of procedure.  The patient was then extubated in operating room and taken in stable condition to recovery room.     [name], M.D.     DB/MEDQ  D:  02/04/2013  T:  02/05/2013  Job:  [id number]

## 2013-02-06 ENCOUNTER — Encounter (HOSPITAL_COMMUNITY)
Admission: EM | Disposition: A | Discharge status: Skilled Nursing Facility | Payer: Self-pay | Source: Home / Self Care | Attending: Internal Medicine

## 2013-02-06 ENCOUNTER — Inpatient Hospital Stay (HOSPITAL_COMMUNITY): Payer: PRIVATE HEALTH INSURANCE | Admitting: Anesthesiology

## 2013-02-06 ENCOUNTER — Inpatient Hospital Stay (HOSPITAL_COMMUNITY): Payer: PRIVATE HEALTH INSURANCE

## 2013-02-06 ENCOUNTER — Encounter (HOSPITAL_COMMUNITY): Payer: Self-pay | Admitting: Anesthesiology

## 2013-02-06 HISTORY — PX: IRRIGATION AND DEBRIDEMENT ABSCESS: SHX5252

## 2013-02-06 LAB — BASIC METABOLIC PANEL WITH GFR
BUN: 57 mg/dL — ABNORMAL HIGH (ref 6–23)
CO2: 19 meq/L (ref 19–32)
Calcium: 9.1 mg/dL (ref 8.4–10.5)
Chloride: 100 meq/L (ref 96–112)
Creatinine, Ser: 2.65 mg/dL — ABNORMAL HIGH (ref 0.50–1.10)
GFR calc Af Amer: 21 mL/min — ABNORMAL LOW
GFR calc non Af Amer: 18 mL/min — ABNORMAL LOW
Glucose, Bld: 125 mg/dL — ABNORMAL HIGH (ref 70–99)
Potassium: 3.9 meq/L (ref 3.5–5.1)
Sodium: 131 meq/L — ABNORMAL LOW (ref 135–145)

## 2013-02-06 LAB — GLUCOSE, CAPILLARY
Glucose-Capillary: 105 mg/dL — ABNORMAL HIGH (ref 70–99)
Glucose-Capillary: 119 mg/dL — ABNORMAL HIGH (ref 70–99)
Glucose-Capillary: 110 mg/dL — ABNORMAL HIGH (ref 70–99)
Glucose-Capillary: 118 mg/dL — ABNORMAL HIGH (ref 70–99)
Glucose-Capillary: 138 mg/dL — ABNORMAL HIGH (ref 70–99)
Glucose-Capillary: 105 mg/dL — ABNORMAL HIGH (ref 70–99)

## 2013-02-06 LAB — CBC
HCT: 27.7 % — ABNORMAL LOW (ref 36.0–46.0)
Hemoglobin: 9.2 g/dL — ABNORMAL LOW (ref 12.0–15.0)
MCH: 26 pg (ref 26.0–34.0)
MCHC: 33.2 g/dL (ref 30.0–36.0)
MCV: 78.2 fL (ref 78.0–100.0)
Platelets: 273 K/uL (ref 150–400)
RBC: 3.54 MIL/uL — ABNORMAL LOW (ref 3.87–5.11)
RDW: 16.3 % — ABNORMAL HIGH (ref 11.5–15.5)
WBC: 26.3 K/uL — ABNORMAL HIGH (ref 4.0–10.5)

## 2013-02-06 SURGERY — IRRIGATION AND DEBRIDEMENT ABSCESS
Anesthesia: General | Site: Buttocks | Wound class: Dirty or Infected

## 2013-02-06 MED ORDER — 0.9 % SODIUM CHLORIDE (POUR BTL) OPTIME
TOPICAL | Status: DC | PRN
  Administered 2013-02-06: 1000 mL

## 2013-02-06 MED ORDER — PROPOFOL 10 MG/ML IV BOLUS
INTRAVENOUS | Status: DC | PRN
  Administered 2013-02-06: 200 mg via INTRAVENOUS

## 2013-02-06 MED ORDER — HYDROMORPHONE HCL PF 1 MG/ML IJ SOLN: 0.2500 mg | INTRAMUSCULAR | Status: DC | PRN

## 2013-02-06 MED ORDER — BUPIVACAINE-EPINEPHRINE 0.5% -1:200000 IJ SOLN
INTRAMUSCULAR | Status: DC | PRN
  Administered 2013-02-06: 1 mL

## 2013-02-06 MED ORDER — OXYCODONE HCL 5 MG PO TABS: 5.0000 mg | ORAL_TABLET | Freq: Once | ORAL | Status: DC | PRN

## 2013-02-06 MED ORDER — HYDROMORPHONE HCL PF 1 MG/ML IJ SOLN
0.2500 mg | INTRAMUSCULAR | Status: DC | PRN
  Administered 2013-02-06: 0.25 mg via INTRAVENOUS
  Administered 2013-02-06: 13:00:00 via INTRAVENOUS

## 2013-02-06 MED ORDER — OXYCODONE HCL 5 MG/5ML PO SOLN: 5.0000 mg | Freq: Once | ORAL | Status: DC | PRN

## 2013-02-06 MED ORDER — ONDANSETRON HCL 4 MG/2ML IJ SOLN: 4.0000 mg | Freq: Once | INTRAMUSCULAR | Status: DC | PRN

## 2013-02-06 MED ORDER — GLUCERNA SHAKE PO LIQD
237.0000 mL | ORAL | Status: DC
  Administered 2013-02-06 – 2013-02-19 (×7): 237 mL via ORAL
  Filled 2013-02-06: qty 237

## 2013-02-06 MED ORDER — PRO-STAT SUGAR FREE PO LIQD
30.0000 mL | Freq: Three times a day (TID) | ORAL | Status: DC
  Administered 2013-02-06 – 2013-02-18 (×21): 30 mL via ORAL
  Filled 2013-02-06 (×38): qty 30

## 2013-02-06 MED ORDER — HYDROMORPHONE HCL PF 1 MG/ML IJ SOLN: 1.0000 mg | INTRAMUSCULAR | Status: DC | PRN

## 2013-02-06 MED ORDER — SUCCINYLCHOLINE CHLORIDE 20 MG/ML IJ SOLN
INTRAMUSCULAR | Status: DC | PRN
  Administered 2013-02-06: 120 mg via INTRAVENOUS

## 2013-02-06 MED ORDER — FENTANYL CITRATE 0.05 MG/ML IJ SOLN: 50.0000 ug | Freq: Once | INTRAMUSCULAR | Status: DC

## 2013-02-06 MED ORDER — WHITE PETROLATUM GEL
Status: AC
  Administered 2013-02-06: 15:00:00
  Filled 2013-02-06: qty 5

## 2013-02-06 MED ORDER — FENTANYL CITRATE 0.05 MG/ML IJ SOLN
INTRAMUSCULAR | Status: DC | PRN
  Administered 2013-02-06: 250 ug via INTRAVENOUS

## 2013-02-06 MED ORDER — HYDROMORPHONE HCL PF 1 MG/ML IJ SOLN
1.0000 mg | INTRAMUSCULAR | Status: DC | PRN
  Administered 2013-02-06 – 2013-02-18 (×57): 1 mg via INTRAVENOUS
  Filled 2013-02-06 (×39): qty 1
  Filled 2013-02-06: qty 2
  Filled 2013-02-06 (×18): qty 1

## 2013-02-06 MED ORDER — METOCLOPRAMIDE HCL 5 MG/ML IJ SOLN: 10.0000 mg | Freq: Once | INTRAMUSCULAR | Status: DC | PRN

## 2013-02-06 MED ORDER — LACTATED RINGERS IV SOLN
INTRAVENOUS | Status: DC | PRN
  Administered 2013-02-06: 11:00:00 via INTRAVENOUS

## 2013-02-06 MED ORDER — OXYCODONE HCL 5 MG PO TABS
5.0000 mg | ORAL_TABLET | ORAL | Status: DC | PRN
  Administered 2013-02-06 – 2013-02-09 (×9): 10 mg via ORAL
  Administered 2013-02-09: 5 mg via ORAL
  Administered 2013-02-09 – 2013-02-15 (×10): 10 mg via ORAL
  Administered 2013-02-16: 5 mg via ORAL
  Administered 2013-02-18 (×2): 10 mg via ORAL
  Filled 2013-02-06 (×10): qty 2
  Filled 2013-02-06: qty 1
  Filled 2013-02-06 (×13): qty 2

## 2013-02-06 SURGICAL SUPPLY — 39 items
BANDAGE GAUZE ELAST BULKY 4 IN (GAUZE/BANDAGES/DRESSINGS) ×4 IMPLANT
BLADE SURG 15 STRL LF DISP TIS (BLADE) ×1 IMPLANT
BLADE SURG 15 STRL SS (BLADE) ×1
CANISTER SUCTION 2500CC (MISCELLANEOUS) ×2 IMPLANT
CLOTH BEACON ORANGE TIMEOUT ST (SAFETY) ×2 IMPLANT
COVER MAYO STAND STRL (DRAPES) ×2 IMPLANT
COVER SURGICAL LIGHT HANDLE (MISCELLANEOUS) ×2 IMPLANT
DRSG PAD ABDOMINAL 8X10 ST (GAUZE/BANDAGES/DRESSINGS) ×4 IMPLANT
ELECT CAUTERY BLADE 6.4 (BLADE) ×2 IMPLANT
ELECT REM PT RETURN 9FT ADLT (ELECTROSURGICAL) ×2
ELECTRODE REM PT RTRN 9FT ADLT (ELECTROSURGICAL) ×1 IMPLANT
GAUZE PACKING IODOFORM 1 (PACKING) IMPLANT
GLOVE BIO SURGEON STRL SZ7.5 (GLOVE) ×2 IMPLANT
GLOVE BIOGEL PI IND STRL 7.0 (GLOVE) ×1 IMPLANT
GLOVE BIOGEL PI IND STRL 7.5 (GLOVE) ×2 IMPLANT
GLOVE BIOGEL PI INDICATOR 7.0 (GLOVE) ×1
GLOVE BIOGEL PI INDICATOR 7.5 (GLOVE) ×2
GLOVE SURG SIGNA 7.5 PF LTX (GLOVE) ×2 IMPLANT
GLOVE SURG SS PI 6.5 STRL IVOR (GLOVE) ×2 IMPLANT
GOWN PREVENTION PLUS XLARGE (GOWN DISPOSABLE) ×4 IMPLANT
GOWN STRL NON-REIN LRG LVL3 (GOWN DISPOSABLE) ×2 IMPLANT
KIT BASIN OR (CUSTOM PROCEDURE TRAY) ×2 IMPLANT
KIT ROOM TURNOVER OR (KITS) ×2 IMPLANT
NEEDLE HYPO 25GX1X1/2 BEV (NEEDLE) ×2 IMPLANT
NS IRRIG 1000ML POUR BTL (IV SOLUTION) ×2 IMPLANT
PACK LITHOTOMY IV (CUSTOM PROCEDURE TRAY) ×2 IMPLANT
PAD ARMBOARD 7.5X6 YLW CONV (MISCELLANEOUS) ×2 IMPLANT
PENCIL BUTTON HOLSTER BLD 10FT (ELECTRODE) ×2 IMPLANT
SPONGE GAUZE 4X4 12PLY (GAUZE/BANDAGES/DRESSINGS) IMPLANT
SPONGE LAP 18X18 X RAY DECT (DISPOSABLE) ×4 IMPLANT
SWAB COLLECTION DEVICE MRSA (MISCELLANEOUS) IMPLANT
SYR BULB 3OZ (MISCELLANEOUS) ×2 IMPLANT
SYR CONTROL 10ML LL (SYRINGE) ×2 IMPLANT
TOWEL OR 17X24 6PK STRL BLUE (TOWEL DISPOSABLE) IMPLANT
TOWEL OR 17X26 10 PK STRL BLUE (TOWEL DISPOSABLE) ×2 IMPLANT
TUBE ANAEROBIC SPECIMEN COL (MISCELLANEOUS) IMPLANT
TUBE CONNECTING 12X1/4 (SUCTIONS) ×2 IMPLANT
UNDERPAD 30X30 INCONTINENT (UNDERPADS AND DIAPERS) ×4 IMPLANT
YANKAUER SUCT BULB TIP NO VENT (SUCTIONS) ×2 IMPLANT

## 2013-02-06 NOTE — Progress Notes (Signed)
INITIAL NUTRITION ASSESSMENT  DOCUMENTATION CODES Per approved criteria  -Morbid Obesity   INTERVENTION: 1. RD to add Low Tyramine Diet restrictions to prevent potential food-medication interaction.  2. Add 30 ml Prostat PO BID and Glucerna Shake PO daily 3. RD to follow nutrition care plan  NUTRITION DIAGNOSIS: 1. Increased nutrient needs related to wound healing as evidenced by estimated needs.  2. Predicted food-medication interaction r/t combined administration of medication and food that results in undesirable interaction AEB order of zyvox.  Goal: 1. Intake to meet at least 90% of estimated needs. 2. Verbalize basic understanding of MAOI Nutrition Therapy.  Monitor:  weight trends, lab trends, I/O's, PO intake, supplement tolerance  Reason for Assessment: Use of Zyvox (Potential Food-Drug Interaction)  62 y.o. female  Admitting Dx: Sepsis  ASSESSMENT: PMHx of DM, obesity, HTN, CKD III. Admitted with diarrhea x 3 days and abdominal cramps. Recent abx use for UTI approx 2-3 weeks ago. Pt with boil increasing in size on L buttock/perineal region.  Surgery evaluated pt on 4/29 with recommendations of I&D of gluteal cellulitis/abscess. Underwent INCISION AND DRAINAGE L BUTTOCK ABSCESS AND L BREAST ABSCESS on 4/30. Returned to OR again today for IRRIGATION AND DEBRIDEMENT LEFT BUTTOCK ABSCESS AND DRESSING CHANGE  RD drawn to chart secondary to current prescription of zyvox, a medication with potential interactions with high tyramine-containing foods.  Oral intake of meals is poor, approximately 25%. Pt is at nutrition risk given increased calorie/protein needs with recent I&D and current poor oral intake.  Height: Ht Readings from Last 1 Encounters:  02/02/13 5\' 4"  (1.626 m)    Weight: Wt Readings from Last 1 Encounters:  02/06/13 303 lb 12.7 oz (137.8 kg)    Ideal Body Weight: 120 lb  % Ideal Body Weight: 253%  Wt Readings from Last 10 Encounters:  02/06/13 303 lb  12.7 oz (137.8 kg)  02/06/13 303 lb 12.7 oz (137.8 kg)  02/06/13 303 lb 12.7 oz (137.8 kg)  04/26/11 290 lb (131.543 kg)    Usual Body Weight: 290 lb  % Usual Body Weight: 104%  BMI:  Body mass index is 52.12 kg/(m^2). Obese Class III - Extreme  Estimated Nutritional Needs: Kcal: 2200 - 2400 Protein: 100 - 115 grams Fluid: 2.2 - 2.4 liters daily  Skin: L perineum + L breast incision  Diet Order: Carb Control Medium 1600 - 2000  EDUCATION NEEDS: -No education needs identified at this time   Intake/Output Summary (Last 24 hours) at 02/06/13 1157 Last data filed at 02/06/13 0517  Gross per 24 hour  Intake   2300 ml  Output    875 ml  Net   1425 ml    Last BM: 4/28  Labs:   Recent Labs Lab 02/02/13 1447  02/04/13 0358 02/05/13 0415 02/06/13 0450  NA  --   < > 130* 130* 131*  K  --   < > 4.0 3.8 3.9  CL  --   < > 98 99 100  CO2  --   < > 19 21 19   BUN  --   < > 48* 53* 57*  CREATININE  --   < > 2.53* 2.79* 2.65*  CALCIUM  --   < > 9.3 9.1 9.1  MG 1.5  --   --   --   --   PHOS 2.9  --  4.1  --   --   GLUCOSE  --   < > 242* 191* 125*  < > = values  in this interval not displayed.  CBG (last 3)   Recent Labs  02/05/13 2145 02/06/13 0735 02/06/13 1059  GLUCAP 99 105* 119*    Scheduled Meds: . allopurinol  100 mg Oral Daily  . cilostazol  100 mg Oral BID  . darifenacin  7.5 mg Oral Daily  . enoxaparin (LOVENOX) injection  40 mg Subcutaneous Q24H  . fentaNYL  50 mcg Intravenous Once  . insulin aspart  0-20 Units Subcutaneous TID WC  . insulin aspart  0-5 Units Subcutaneous QHS  . insulin aspart protamine- aspart  34 Units Subcutaneous Q supper  . insulin aspart protamine- aspart  40 Units Subcutaneous Q breakfast  . levothyroxine  150 mcg Oral QAC breakfast  . linezolid  600 mg Intravenous Q12H  . multivitamin with minerals  1 tablet Oral Daily  . pantoprazole  40 mg Oral Daily  . piperacillin-tazobactam (ZOSYN)  IV  3.375 g Intravenous Q8H  .  white petrolatum        Continuous Infusions: . sodium chloride 125 mL/hr at 02/05/13 2048    Past Medical History  Diagnosis Date  . History of hip replacement, total   . Hx MRSA infection     abscess left groin  . Arthritis   . GERD (gastroesophageal reflux disease)   . Gout   . Diabetes mellitus   . Hypertension   . Thyroid disease     Past Surgical History  Procedure Laterality Date  . Thyroidectomy    . Abdominal hysterectomy    . Ventral hernia repair    . Laparoscopic cholecystectomy    . Forearm fracture surgery      Left arm  . Joint replacement    . Hernia repair      w/ mesh    Inda Coke MS, RD, LDN Pager: [phone] After-hours pager: [phone]

## 2013-02-06 NOTE — Interval H&P Note (Signed)
History and Physical Interval Note: no change in H and P  02/06/2013 8:50 AM  [name] [name]  has presented today for surgery, with the diagnosis of buttock abscess  The various methods of treatment have been discussed with the patient and family. After consideration of risks, benefits and other options for treatment, the patient has consented to  Procedure(s): IRRIGATION AND DEBRIDEMENT BUTTOCK ABSCESS AND DRESSING CHANGE (N/A) as a surgical intervention .  The patient's history has been reviewed, patient examined, no change in status, stable for surgery.  I have reviewed the patient's chart and labs.  Questions were answered to the patient's satisfaction.     Elanore Talcott A

## 2013-02-06 NOTE — Progress Notes (Signed)
Patient ID: [name] [name], female   DOB: 09-03-1951, 62 y.o.   MRN: [id number]  Plan return to OR today for dressing change and possible further debridement of left buttock abscess She agrees to proceed.

## 2013-02-06 NOTE — Op Note (Signed)
IRRIGATION AND DEBRIDEMENT BUTTOCK ABSCESS AND DRESSING CHANGE  Procedure Note  Maddalena Horng 02/02/2013 - 02/06/2013   Pre-op Diagnosis: left buttock abscess     Post-op Diagnosis: same  Procedure(s): IRRIGATION AND DEBRIDEMENT LEFT BUTTOCK ABSCESS AND DRESSING CHANGE  Surgeon(s): Harl Bowie, MD  Anesthesia: General  Staff:  Circulator: Rosalio Macadamia, RN Relief Circulator: Jaci Standard, RN Scrub Person: Kateri Plummer, CST Circulator Assistant: Aneta Mins, RN; Megan Day Cavanaugh, RN; Cyd Silence, RN; Quincy Carnes, RN  Estimated Blood Loss: Minimal                         Kimoni Pagliarulo A   Date: 02/06/2013  Time: 11:43 AM

## 2013-02-06 NOTE — Transfer of Care (Signed)
Immediate Anesthesia Transfer of Care Note  Patient: [name] [name]  Procedure(s) Performed: Procedure(s): IRRIGATION AND DEBRIDEMENT BUTTOCK ABSCESS AND DRESSING CHANGE (N/A)  Patient Location: PACU  Anesthesia Type:General  Level of Consciousness: awake, alert , oriented and sedated  Airway & Oxygen Therapy: Patient Spontanous Breathing and Patient connected to nasal cannula oxygen  Post-op Assessment: Report given to PACU RN, Post -op Vital signs reviewed and stable and Patient moving all extremities  Post vital signs: Reviewed and stable  Complications: No apparent anesthesia complications

## 2013-02-06 NOTE — Anesthesia Preprocedure Evaluation (Addendum)
Anesthesia Evaluation  Patient identified by MRN, date of birth, ID band Patient awake    Reviewed: Allergy & Precautions, H&P , NPO status , Patient's Chart, lab work & pertinent test results, reviewed documented beta blocker date and time   Airway Mallampati: II TM Distance: >3 FB Neck ROM: full    Dental   Pulmonary neg pulmonary ROS,  breath sounds clear to auscultation        Cardiovascular hypertension, On Medications + Peripheral Vascular Disease negative cardio ROS  Rhythm:regular     Neuro/Psych negative neurological ROS  negative psych ROS   GI/Hepatic Neg liver ROS, GERD-  Medicated and Controlled,  Endo/Other  diabetesMorbid obesity  Renal/GU Renal disease  negative genitourinary   Musculoskeletal   Abdominal   Peds  Hematology negative hematology ROS (+)   Anesthesia Other Findings See surgeon's H&P   Reproductive/Obstetrics negative OB ROS                           Anesthesia Physical Anesthesia Plan  ASA: III  Anesthesia Plan: General   Post-op Pain Management:    Induction: Intravenous  Airway Management Planned: Oral ETT  Additional Equipment:   Intra-op Plan:   Post-operative Plan: Extubation in OR  Informed Consent: I have reviewed the patients History and Physical, chart, labs and discussed the procedure including the risks, benefits and alternatives for the proposed anesthesia with the patient or authorized representative who has indicated his/her understanding and acceptance.   Dental Advisory Given  Plan Discussed with: CRNA and Surgeon  Anesthesia Plan Comments:         Anesthesia Quick Evaluation

## 2013-02-06 NOTE — Progress Notes (Signed)
[facility] Progress Note [name] [id number] DOB: 1951-09-29 DOA: 02/02/2013 PCP: [name] Fendt, MD  Brief narrative: 62 y.o. female with PMH of DM, Obesity, HTN, CKD 3, who presented with c/o diarrhea and a gluteal boil.  She c/o diarrhea for ~5 days, non bloodly and non bilious, associated with lower abdominal cramps, with h/o recent antibiotic use 2-3 weeks prior for UTI.  In addition she noted a boil of her L buttock /perineal region for 1 week which had progressively increased in size and was very painful.  She denied any drainage from it.  She denied any fevers, but did report chills.  In the ER she was noted to be tachycardic with HR 130s, BP was low at 73/49 (improved with IVF to low 100s), WBC of 23K and electrolyte abormalities.  Since admission the patient has been treated with IV fluid resuscitation and empiric antibiotics.  General surgery was consulted and the patient has been taken to the operating room twice thus far for I and D of her gluteal abscess.  Culture results are still pending.  The patient stabilized and was able to be transferred to the surgical floor on 02/05/2013.  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  - cont broad coverage until cultures dictate otherwise  DM  - CBGs well controlled at this time - no change in treatment plan today  ARF on CKD stage III - likely combo of prerenal and ATN (contrast-induced nephropathy) - appreciate Nephro consult - renal fxn improving   Diarrhea - Resolved, with patient actually now complaining of constipation  Hyponatremia - likely related to her acute renal failure as well as volume depletion - slowly improving    Hypokalemia - Resolved  Morbid obesity  Abnormal fluid and calcium collection in pelvis (noted via CT scan) will need repeat imaging this hospital stay to attempt to further define (MRI suggested by Urologist) - scheduled for Saturday  Gout Quiescent  Code Status: FULL Family Communication: Spoke to patient  Disposition Plan: Medical bed  Consultants: Nephro Gen Surgery  Procedures: 4/30 - I&D L breast and L gluteal abscesses  5/2 - repeat I&D left gluteal abscess  Antibiotics: Vanc- 4/29 Zosyn- 4/29 >> Zyvox- 4/29 >>  DVT prophylaxis: Lovenox  HPI/Subjective: The patient is awake but somewhat sedated with pain medication.  She does report ongoing pain in her left buttock.  She denies chest pain or shortness of breath.  Objective: Blood pressure 130/52, pulse 103, temperature 98.4 F (36.9 C), temperature source Oral, resp. rate 16, height 5\' 4"  (1.626 m), weight 137.8 kg (303 lb 12.7 oz), SpO2 97.00%.  Intake/Output Summary (Last 24 hours) at 02/06/13 1602 Last data filed at 02/06/13 1400  Gross per 24 hour  Intake   2075 ml  Output   1500 ml  Net    575 ml   Exam: General: No acute respiratory distress Lungs: Clear to auscultation bilaterally without wheezes or crackles Cardiovascular: Regular rate and rhythm without murmur gallop or rub  Abdomen: Nontender, nondistended, soft, bowel sounds positive, no rebound, no ascites, no appreciable mass - obese Extremities: No significant cyanosis, clubbing, or edema bilateral lower extremities  Data Reviewed: Basic  Metabolic Panel:  Recent Labs Lab 02/02/13 1447 02/02/13 2102 02/03/13 0515 02/04/13 0358 02/05/13 0415 02/06/13 0450  NA  --  130* 128* 130* 130* 131*  K  --  3.8 4.1 4.0 3.8 3.9  CL  --  97 96 98 99 100  CO2  --  20 20 19 21 19   GLUCOSE  --  291* 291* 242* 191* 125*  BUN  --  35* 41* 48* 53* 57*  CREATININE  --  2.23* 2.40* 2.53* 2.79* 2.65*  CALCIUM  --  8.7 8.8 9.3 9.1 9.1  MG 1.5  --    --   --   --   --   PHOS 2.9  --   --  4.1  --   --    Liver Function Tests:  Recent Labs Lab 02/02/13 0850 02/03/13 0515 02/04/13 0358  AST 45* 36  --   ALT 41* 37*  --   ALKPHOS 123* 122*  --   BILITOT 1.2 1.0  --   PROT 7.0 5.8*  --   ALBUMIN 2.5* 1.9* 1.7*    Recent Labs Lab 02/02/13 0850  LIPASE 17   CBC:  Recent Labs Lab 02/02/13 0850 02/03/13 0515 02/04/13 0358 02/05/13 0415 02/06/13 0450  WBC 23.3* 20.3* 21.1* 23.4* 26.3*  NEUTROABS 21.1*  --   --   --   --   HGB 11.4* 9.9* 10.5* 9.2* 9.2*  HCT 33.9* 29.7* 30.9* 27.4* 27.7*  MCV 78.5 78.0 78.0 77.6* 78.2  PLT 209 193 212 249 273   Cardiac Enzymes:  Recent Labs Lab 02/02/13 0850 02/03/13 1632  CKTOTAL  --  158  TROPONINI <0.30  --    CBG:  Recent Labs Lab 02/05/13 2145 02/06/13 0735 02/06/13 1059 02/06/13 1227 02/06/13 1434  GLUCAP 99 105* 119* 110* 118*    Recent Results (from the past 240 hour(s))  CULTURE, BLOOD (ROUTINE X 2)     Status: None   Collection Time    02/02/13 10:27 AM      Result Value Range Status   Specimen Description BLOOD FOREARM RIGHT   Final   Special Requests BOTTLES DRAWN AEROBIC ONLY 5CC   Final   Culture  Setup Time 02/02/2013 15:05   Final   Culture     Final   Value:        BLOOD CULTURE RECEIVED NO GROWTH TO DATE CULTURE WILL BE HELD FOR 5 DAYS BEFORE ISSUING A FINAL NEGATIVE REPORT   Report Status PENDING   Incomplete  CULTURE, BLOOD (ROUTINE X 2)     Status: None   Collection Time    02/02/13 10:35 AM      Result Value Range Status   Specimen Description BLOOD HAND RIGHT   Final   Special Requests BOTTLES DRAWN AEROBIC AND ANAEROBIC 10CC   Final   Culture  Setup Time 02/02/2013 15:05   Final   Culture     Final   Value:        BLOOD CULTURE RECEIVED NO GROWTH TO DATE CULTURE WILL BE HELD FOR 5 DAYS BEFORE ISSUING A FINAL NEGATIVE REPORT   Report Status PENDING   Incomplete  URINE CULTURE     Status: None   Collection Time    02/02/13 12:23 PM       Result Value Range Status   Specimen Description URINE, CATHETERIZED   Final   Special Requests ADDED [id number]   Final   Culture  Setup Time 02/02/2013  22:37   Final   Colony Count NO GROWTH   Final   Culture NO GROWTH   Final   Report Status 02/03/2013 FINAL   Final  URINE CULTURE     Status: None   Collection Time    02/03/13  6:08 PM      Result Value Range Status   Specimen Description URINE, CATHETERIZED   Final   Special Requests NONE   Final   Culture  Setup Time 02/03/2013 20:04   Final   Colony Count NO GROWTH   Final   Culture NO GROWTH   Final   Report Status 02/05/2013 FINAL   Final  CULTURE, ROUTINE-ABSCESS     Status: None   Collection Time    02/04/13 12:11 PM      Result Value Range Status   Specimen Description ABSCESS   Final   Special Requests LEFT GLUTEAL PT ON ZOSYN   Final   Gram Stain     Final   Value: FEW WBC PRESENT,BOTH PMN AND MONONUCLEAR     NO SQUAMOUS EPITHELIAL CELLS SEEN     MODERATE GRAM POSITIVE COCCI     IN PAIRS   Culture Culture reincubated for better growth   Final   Report Status PENDING   Incomplete  ANAEROBIC CULTURE     Status: None   Collection Time    02/04/13 12:11 PM      Result Value Range Status   Specimen Description ABSCESS   Final   Special Requests LEFT GLUTEAL PT ON ZOSYN   Final   Gram Stain PENDING   Incomplete   Culture     Final   Value: NO ANAEROBES ISOLATED; CULTURE IN PROGRESS FOR 5 DAYS   Report Status PENDING   Incomplete     Studies:  Recent x-ray studies have been reviewed in detail by the Attending Physician  Scheduled Meds:  Scheduled Meds: . allopurinol  100 mg Oral Daily  . cilostazol  100 mg Oral BID  . darifenacin  7.5 mg Oral Daily  . enoxaparin (LOVENOX) injection  40 mg Subcutaneous Q24H  . feeding supplement  237 mL Oral Q24H  . feeding supplement  30 mL Oral TID WC  . fentaNYL  50 mcg Intravenous Once  . insulin aspart  0-20 Units Subcutaneous TID WC  . insulin aspart  0-5  Units Subcutaneous QHS  . insulin aspart protamine- aspart  34 Units Subcutaneous Q supper  . insulin aspart protamine- aspart  40 Units Subcutaneous Q breakfast  . levothyroxine  150 mcg Oral QAC breakfast  . linezolid  600 mg Intravenous Q12H  . multivitamin with minerals  1 tablet Oral Daily  . pantoprazole  40 mg Oral Daily  . piperacillin-tazobactam (ZOSYN)  IV  3.375 g Intravenous Q8H   Continuous Infusions: . sodium chloride 125 mL/hr at 02/05/13 2048    Time spent on care of this patient: 25 min   [place], MD [place]  [phone] Pager - Text Page per Shea Evans as per below:  On-Call/Text Page:      [email or web address]      password [email or web address]  If 7PM-7AM, please contact night-coverage [email or web address] Password [email or web address] 02/06/2013, 4:02 PM   LOS: 4 days

## 2013-02-06 NOTE — H&P (View-Only) (Signed)
Patient ID: [name] [name], female   DOB: Feb 14, 1951, 62 y.o.   MRN: [id number]  Plan return to OR today for dressing change and possible further debridement of left buttock abscess She agrees to proceed.

## 2013-02-06 NOTE — Anesthesia Procedure Notes (Signed)
Procedure Name: Intubation Date/Time: 02/06/2013 11:21 AM Performed by: Scheryl Darter Pre-anesthesia Checklist: Patient identified, Emergency Drugs available, Suction available, Patient being monitored and Timeout performed Patient Re-evaluated:Patient Re-evaluated prior to inductionOxygen Delivery Method: Circle system utilized Preoxygenation: Pre-oxygenation with 100% oxygen Intubation Type: IV induction, Rapid sequence and Cricoid Pressure applied Laryngoscope Size: Miller and 3 Grade View: Grade I Tube type: Oral Tube size: 7.5 mm Number of attempts: 1 Airway Equipment and Method: Stylet Placement Confirmation: ETT inserted through vocal cords under direct vision,  positive ETCO2 and breath sounds checked- equal and bilateral Secured at: 23 cm Tube secured with: Tape Dental Injury: Teeth and Oropharynx as per pre-operative assessment

## 2013-02-06 NOTE — Anesthesia Postprocedure Evaluation (Signed)
  Anesthesia Post-op Note  Patient: [name] [name]  Procedure(s) Performed: Procedure(s): IRRIGATION AND DEBRIDEMENT BUTTOCK ABSCESS AND DRESSING CHANGE (N/A)  Patient Location: PACU  Anesthesia Type:General  Level of Consciousness: awake, alert , oriented and patient cooperative  Airway and Oxygen Therapy: Patient Spontanous Breathing  Post-op Pain: mild  Post-op Assessment: Post-op Vital signs reviewed, Patient's Cardiovascular Status Stable, Respiratory Function Stable, Patent Airway, No signs of Nausea or vomiting and Pain level controlled  Post-op Vital Signs: stable  Complications: No apparent anesthesia complications

## 2013-02-06 NOTE — Progress Notes (Signed)
Patient ID: [name] [name], female   DOB: 06-01-1951, 62 y.o.   MRN: [id number] S:pt back from second debridement of gluteal abscess.  Somewhat lethargic from anesthesia. No complaints O:BP 108/58  Pulse 100  Temp(Src) 98.2 F (36.8 C) (Oral)  Resp 20  Ht 5\' 4"  (1.626 m)  Wt 137.8 kg (303 lb 12.7 oz)  BMI 52.12 kg/m2  SpO2 97%  Intake/Output Summary (Last 24 hours) at 02/06/13 1032 Last data filed at 02/06/13 0517  Gross per 24 hour  Intake   2425 ml  Output    875 ml  Net   1550 ml   Intake/Output: I/O last 3 completed shifts: In: 4960.4 [P.O.:600; I.V.:3660.4; IV Piggyback:700] Out: 1525 [Urine:1525]  Intake/Output this shift:    Weight change: -6.2 kg (-13 lb 10.7 oz) Gen:WD WN obese AAF CVS:no rub Resp:cta with decreased bs [id number] Ext:no edema   Recent Labs Lab 02/02/13 0850 02/02/13 1447 02/02/13 2102 02/03/13 0515 02/04/13 0358 02/05/13 0415 02/06/13 0450  NA 130*  --  130* 128* 130* 130* 131*  K 2.9*  --  3.8 4.1 4.0 3.8 3.9  CL 93*  --  97 96 98 99 100  CO2 22  --  20 20 19 21 19   GLUCOSE 231*  --  291* 291* 242* 191* 125*  BUN 28*  --  35* 41* 48* 53* 57*  CREATININE 1.77*  --  2.23* 2.40* 2.53* 2.79* 2.65*  ALBUMIN 2.5*  --   --  1.9* 1.7*  --   --   CALCIUM 9.6  --  8.7 8.8 9.3 9.1 9.1  PHOS  --  2.9  --   --  4.1  --   --   AST 45*  --   --  36  --   --   --   ALT 41*  --   --  37*  --   --   --    Liver Function Tests:  Recent Labs Lab 02/02/13 0850 02/03/13 0515 02/04/13 0358  AST 45* 36  --   ALT 41* 37*  --   ALKPHOS 123* 122*  --   BILITOT 1.2 1.0  --   PROT 7.0 5.8*  --   ALBUMIN 2.5* 1.9* 1.7*    Recent Labs Lab 02/02/13 0850  LIPASE 17   No results found for this basename: AMMONIA,  in the last 168 hours CBC:  Recent Labs Lab 02/02/13 0850 02/03/13 0515 02/04/13 0358 02/05/13 0415 02/06/13 0450  WBC 23.3* 20.3* 21.1* 23.4* 26.3*  NEUTROABS 21.1*  --   --   --   --   HGB 11.4* 9.9* 10.5* 9.2* 9.2*  HCT 33.9*  29.7* 30.9* 27.4* 27.7*  MCV 78.5 78.0 78.0 77.6* 78.2  PLT 209 193 212 249 273   Cardiac Enzymes:  Recent Labs Lab 02/02/13 0850 02/03/13 1632  CKTOTAL  --  158  TROPONINI <0.30  --    CBG:  Recent Labs Lab 02/05/13 0807 02/05/13 1222 02/05/13 1614 02/05/13 2145 02/06/13 0735  GLUCAP 160* 143* 168* 99 105*    Iron Studies: No results found for this basename: IRON, TIBC, TRANSFERRIN, FERRITIN,  in the last 72 hours Studies/Results: No results found. Marland Kitchen allopurinol  100 mg Oral Daily  . cilostazol  100 mg Oral BID  . darifenacin  7.5 mg Oral Daily  . enoxaparin (LOVENOX) injection  40 mg Subcutaneous Q24H  . insulin aspart  0-20 Units Subcutaneous TID WC  . insulin aspart  0-5  Units Subcutaneous QHS  . insulin aspart protamine- aspart  34 Units Subcutaneous Q supper  . insulin aspart protamine- aspart  40 Units Subcutaneous Q breakfast  . levothyroxine  150 mcg Oral QAC breakfast  . linezolid  600 mg Intravenous Q12H  . multivitamin with minerals  1 tablet Oral Daily  . pantoprazole  40 mg Oral Daily  . piperacillin-tazobactam (ZOSYN)  IV  3.375 g Intravenous Q8H  . white petrolatum        BMET    Component Value Date/Time   NA 131* 02/06/2013 0450   K 3.9 02/06/2013 0450   CL 100 02/06/2013 0450   CO2 19 02/06/2013 0450   GLUCOSE 125* 02/06/2013 0450   BUN 57* 02/06/2013 0450   CREATININE 2.65* 02/06/2013 0450   CALCIUM 9.1 02/06/2013 0450   GFRNONAA 18* 02/06/2013 0450   GFRAA 21* 02/06/2013 0450   CBC    Component Value Date/Time   WBC 26.3* 02/06/2013 0450   RBC 3.54* 02/06/2013 0450   HGB 9.2* 02/06/2013 0450   HCT 27.7* 02/06/2013 0450   PLT 273 02/06/2013 0450   MCV 78.2 02/06/2013 0450   MCH 26.0 02/06/2013 0450   MCHC 33.2 02/06/2013 0450   RDW 16.3* 02/06/2013 0450   LYMPHSABS 1.0 02/02/2013 0850   MONOABS 1.2* 02/02/2013 0850   EOSABS 0.0 02/02/2013 0850   BASOSABS 0.0 02/02/2013 0850     Assessment/Plan:  1. AKI/CKD- continuing to see an improvement in BUN/Scr.   Increasing UOP.  No indication for HD. Cont to follow.  1. non-oliguric. Pt was hypotensive, volume depleted and received IV contrast, therefore likely combo of ischemic ATN further c/b contrast-induced nephropathy.  2. SIRS- GPC in pairs from gluteal abscess. Urine and blood cultures negative thus far. Okay to cont with vanco per pharmacy dosing as random level was only 10. 3. Gluteal abscess. S/p I&D 4. DM- per primary svc 5. HTN- now hypotensive with early SIRS. 6. Hypokalemia- repleted and would be careful not to overshoot correction. 7. Gout- stble 8. Hyponatremia- related to AKI. stable 9. ABLA- guaiac stool and follow 10. Diarrhea- ?C. Diff or related to cellulitis/intra-abd process. [place]

## 2013-02-07 LAB — RENAL FUNCTION PANEL
Albumin: 1.5 g/dL — ABNORMAL LOW (ref 3.5–5.2)
BUN: 45 mg/dL — ABNORMAL HIGH (ref 6–23)
CO2: 20 mEq/L (ref 19–32)
Calcium: 9.1 mg/dL (ref 8.4–10.5)
Chloride: 104 mEq/L (ref 96–112)
Creatinine, Ser: 1.82 mg/dL — ABNORMAL HIGH (ref 0.50–1.10)
GFR calc Af Amer: 33 mL/min — ABNORMAL LOW (ref >90)
GFR calc non Af Amer: 29 mL/min — ABNORMAL LOW (ref >90)
Glucose, Bld: 119 mg/dL — ABNORMAL HIGH (ref 70–99)
Phosphorus: 4 mg/dL (ref 2.3–4.6)
Potassium: 4 mEq/L (ref 3.5–5.1)
Sodium: 135 mEq/L (ref 135–145)

## 2013-02-07 LAB — CULTURE, ROUTINE-ABSCESS

## 2013-02-07 LAB — GLUCOSE, CAPILLARY
Glucose-Capillary: 96 mg/dL (ref 70–99)
Glucose-Capillary: 99 mg/dL (ref 70–99)
Glucose-Capillary: 130 mg/dL — ABNORMAL HIGH (ref 70–99)
Glucose-Capillary: 153 mg/dL — ABNORMAL HIGH (ref 70–99)

## 2013-02-07 LAB — CBC
HCT: 27.1 % — ABNORMAL LOW (ref 36.0–46.0)
Hemoglobin: 8.9 g/dL — ABNORMAL LOW (ref 12.0–15.0)
MCH: 25.6 pg — ABNORMAL LOW (ref 26.0–34.0)
MCHC: 32.8 g/dL (ref 30.0–36.0)
MCV: 78.1 fL (ref 78.0–100.0)
Platelets: 268 10*3/uL (ref 150–400)
RBC: 3.47 MIL/uL — ABNORMAL LOW (ref 3.87–5.11)
RDW: 16.4 % — ABNORMAL HIGH (ref 11.5–15.5)
WBC: 22.3 10*3/uL — ABNORMAL HIGH (ref 4.0–10.5)

## 2013-02-07 MED ORDER — POLYETHYLENE GLYCOL 3350 17 G PO PACK
17.0000 g | PACK | Freq: Two times a day (BID) | ORAL | Status: DC
  Administered 2013-02-07 – 2013-02-20 (×13): 17 g via ORAL
  Filled 2013-02-07 (×30): qty 1

## 2013-02-07 NOTE — Progress Notes (Signed)
Poor po intake, will be NPO past midnight.  MD notified. New order received.

## 2013-02-07 NOTE — Progress Notes (Signed)
[facility] PROGRESS NOTE  [name] [name] [id number] DOB: 1951/09/07 DOA: 02/02/2013 PCP: [name], MD  Assessment/Plan: 62 y.o. female with PMH of DM, Obesity, HTN, CKD 3, who presented with c/o diarrhea and a gluteal boil. She c/o diarrhea for ~5 days, non bloodly and non bilious, associated with lower abdominal cramps, with h/o recent antibiotic use 2-3 weeks prior for UTI. In addition she noted a boil of her L buttock /perineal region for 1 week which had progressively increased in size and was very painful. She denied any drainage from it. She denied any fevers, but did report chills. In the ER she was noted to be tachycardic with HR 130s, BP was low at 73/49 (improved with IVF to low 100s), WBC of 23K and electrolyte abormalities.  Since admission the patient has been treated with IV fluid resuscitation and empiric antibiotics. General surgery was consulted and the patient has been taken to the operating room twice thus far for I and D of her gluteal abscess.  The patient stabilized and was able to be transferred to the surgical floor on 02/05/2013.    Assessment/Plan:  Sepsis due to Cellulitis - gluteal abscess  - sepsis resolved. - Vanc changed to Zyvox due to ARF  - s/p I&D in OR per Gen Surg 4/30, w/ return to OR 5/2 for dressing change and further I&D  - cont broad coverage. Will consider stop linezolid if ok with surgery. -Culture grew microaerophilic strep.  -Day 5 antibiotics.  -WBC trending down.  -Will give IV pain medication now.   DM  Continue with 70/30.  ARF on CKD stage III  - likely combo of prerenal and ATN (contrast-induced nephropathy)  - appreciate Nephro consult  - renal fxn improving, cr decrease to 1.8. Cr peak to 2.7 Diarrhea  - Resolved, with patient actually now complaining of constipation  Hyponatremia  - likely related to her acute renal failure as well as volume depletion  -Resolved.  Hypokalemia  - Resolved  Morbid obesity   Abnormal fluid and calcium collection in pelvis (noted via CT scan)  MRI 5-2: Interval resolution of intra pelvic fluid collection Gout : Continue with Allopurinol.     Code Status: Full  Family Communication: Care discussed with husband who was at bedside.  Disposition Plan: to be determine.    Consultants: Nephro  Gen Surgery   Procedures: 4/30 - I&D L breast and L gluteal abscesses  5/2 - repeat I&D left gluteal abscess   Antibiotics: Zosyn- 4/29 >>  Zyvox- 4/29 >>   HPI/Subjective: Patient complaining of buttock pain. Crying.  No abdominal pain.    Objective: Filed Vitals:   02/06/13 2250 02/07/13 0154 02/07/13 0648 02/07/13 0933  BP: 138/59 156/69 148/56 154/52  Pulse: 98 92 96 97  Temp: 97.8 F (36.6 C) 98.2 F (36.8 C) 97.9 F (36.6 C) 98.6 F (37 C)  TempSrc: Oral Oral Oral Oral  Resp: 20 20 20 22   Height:      Weight:      SpO2: 100% 92% 95% 97%    Intake/Output Summary (Last 24 hours) at 02/07/13 0947 Last data filed at 02/07/13 0600  Gross per 24 hour  Intake 2343.33 ml  Output   2275 ml  Net  68.33 ml   Filed Weights   02/04/13 0415 02/05/13 0434 02/06/13 0512  Weight: 138.8 kg (306 lb) 144 kg (317 lb 7.4 oz) 137.8 kg (303 lb 12.7 oz)    Exam:   General: Patient crying, distress because  of pain.   Cardiovascular: S 1, S 2 RRR  Respiratory: CTA  Abdomen: BS present, soft, NT.   Musculoskeletal: left Buttock with dressing.   Data Reviewed: Basic Metabolic Panel:  Recent Labs Lab 02/02/13 1447  02/03/13 0515 02/04/13 0358 02/05/13 0415 02/06/13 0450 02/07/13 0509  NA  --   < > 128* 130* 130* 131* 135  K  --   < > 4.1 4.0 3.8 3.9 4.0  CL  --   < > 96 98 99 100 104  CO2  --   < > 20 19 21 19 20   GLUCOSE  --   < > 291* 242* 191* 125* 119*  BUN  --   < > 41* 48* 53* 57* 45*  CREATININE  --   < > 2.40* 2.53* 2.79* 2.65* 1.82*  CALCIUM  --   < > 8.8 9.3 9.1 9.1 9.1  MG 1.5  --   --   --   --   --   --   PHOS 2.9  --    --  4.1  --   --  4.0  < > = values in this interval not displayed. Liver Function Tests:  Recent Labs Lab 02/02/13 0850 02/03/13 0515 02/04/13 0358 02/07/13 0509  AST 45* 36  --   --   ALT 41* 37*  --   --   ALKPHOS 123* 122*  --   --   BILITOT 1.2 1.0  --   --   PROT 7.0 5.8*  --   --   ALBUMIN 2.5* 1.9* 1.7* 1.5*    Recent Labs Lab 02/02/13 0850  LIPASE 17   No results found for this basename: AMMONIA,  in the last 168 hours CBC:  Recent Labs Lab 02/02/13 0850 02/03/13 0515 02/04/13 0358 02/05/13 0415 02/06/13 0450 02/07/13 0509  WBC 23.3* 20.3* 21.1* 23.4* 26.3* 22.3*  NEUTROABS 21.1*  --   --   --   --   --   HGB 11.4* 9.9* 10.5* 9.2* 9.2* 8.9*  HCT 33.9* 29.7* 30.9* 27.4* 27.7* 27.1*  MCV 78.5 78.0 78.0 77.6* 78.2 78.1  PLT 209 193 212 249 273 268   Cardiac Enzymes:  Recent Labs Lab 02/02/13 0850 02/03/13 1632  CKTOTAL  --  158  TROPONINI <0.30  --    BNP (last 3 results) No results found for this basename: PROBNP,  in the last 8760 hours CBG:  Recent Labs Lab 02/06/13 1227 02/06/13 1434 02/06/13 1725 02/06/13 2252 02/07/13 0805  GLUCAP 110* 118* 138* 105* 96    Recent Results (from the past 240 hour(s))  CULTURE, BLOOD (ROUTINE X 2)     Status: None   Collection Time    02/02/13 10:27 AM      Result Value Range Status   Specimen Description BLOOD FOREARM RIGHT   Final   Special Requests BOTTLES DRAWN AEROBIC ONLY 5CC   Final   Culture  Setup Time 02/02/2013 15:05   Final   Culture     Final   Value:        BLOOD CULTURE RECEIVED NO GROWTH TO DATE CULTURE WILL BE HELD FOR 5 DAYS BEFORE ISSUING A FINAL NEGATIVE REPORT   Report Status PENDING   Incomplete  CULTURE, BLOOD (ROUTINE X 2)     Status: None   Collection Time    02/02/13 10:35 AM      Result Value Range Status   Specimen Description BLOOD HAND RIGHT  Final   Special Requests BOTTLES DRAWN AEROBIC AND ANAEROBIC 10CC   Final   Culture  Setup Time 02/02/2013 15:05   Final    Culture     Final   Value:        BLOOD CULTURE RECEIVED NO GROWTH TO DATE CULTURE WILL BE HELD FOR 5 DAYS BEFORE ISSUING A FINAL NEGATIVE REPORT   Report Status PENDING   Incomplete  URINE CULTURE     Status: None   Collection Time    02/02/13 12:23 PM      Result Value Range Status   Specimen Description URINE, CATHETERIZED   Final   Special Requests ADDED [id number]   Final   Culture  Setup Time 02/02/2013 22:37   Final   Colony Count NO GROWTH   Final   Culture NO GROWTH   Final   Report Status 02/03/2013 FINAL   Final  URINE CULTURE     Status: None   Collection Time    02/03/13  6:08 PM      Result Value Range Status   Specimen Description URINE, CATHETERIZED   Final   Special Requests NONE   Final   Culture  Setup Time 02/03/2013 20:04   Final   Colony Count NO GROWTH   Final   Culture NO GROWTH   Final   Report Status 02/05/2013 FINAL   Final  CULTURE, ROUTINE-ABSCESS     Status: None   Collection Time    02/04/13 12:11 PM      Result Value Range Status   Specimen Description ABSCESS   Final   Special Requests LEFT GLUTEAL PT ON ZOSYN   Final   Gram Stain     Final   Value: FEW WBC PRESENT,BOTH PMN AND MONONUCLEAR     NO SQUAMOUS EPITHELIAL CELLS SEEN     MODERATE GRAM POSITIVE COCCI     IN PAIRS   Culture     Final   Value: FEW MICROAEROPHILIC STREPTOCOCCI     Note: Standardized susceptibility testing for this organism is not available.   Report Status 02/07/2013 FINAL   Final  ANAEROBIC CULTURE     Status: None   Collection Time    02/04/13 12:11 PM      Result Value Range Status   Specimen Description ABSCESS   Final   Special Requests LEFT GLUTEAL PT ON ZOSYN   Final   Gram Stain PENDING   Incomplete   Culture     Final   Value: NO ANAEROBES ISOLATED; CULTURE IN PROGRESS FOR 5 DAYS   Report Status PENDING   Incomplete     Studies: Mr Pelvis Wo Contrast  02/07/2013  *RADIOLOGY REPORT*  Clinical Data: Follow-up pelvic fluid collection.  MRI PELVIS WITHOUT  CONTRAST  Technique:  Multi-planar multi-sequence MR imaging of the pelvis was performed following the standard protocol. No intravenous contrast was administered.  Comparison: Pelvic CT 02/02/2013.  Findings: Study is mildly motion degraded, although sufficiently diagnostic.  A Foley catheter has been placed with decompression of the urinary bladder.  The previously demonstrated fluid collection is no longer present.  There is no significant free pelvic fluid. There are some dilated loops of small bowel within the false pelvis.  There is no evidence of pelvic mass or lymphadenopathy.  Subcutaneous edema is present within the anterior pelvic soft tissues.  No focal hernia is identified. There is there is also altered signal within the subcutaneous fat in the inferior left gluteal region where  there are two new focal areas of low T2 and intermediate T1 signal.  This is incompletely visualized on this examination and was not included on the prior pelvic CT.  Patient is status post right total hip arthroplasty with associated susceptibility artifact.  No acute osseous findings are identified. Mild disc bulging is present in the lower lumbar spine.  IMPRESSION:  1.  Interval resolution of intra pelvic fluid collection. 2.  Subcutaneous edema anteriorly with possible atypical fluid or air collections in the inferior left gluteal region. Is there a decubitus ulcer in this area or clinical concern of perianal pathology?  This is incompletely visualized by the current examination and requires clinical correlation.   Follow-up pelvic CT with more inferior imaging may be helpful especially if this is the site of the palpable concern previously evaluated by ultrasound.   Original Report Authenticated By: Richardean Sale, M.D.     Scheduled Meds: . allopurinol  100 mg Oral Daily  . cilostazol  100 mg Oral BID  . darifenacin  7.5 mg Oral Daily  . enoxaparin (LOVENOX) injection  40 mg Subcutaneous Q24H  . feeding  supplement  237 mL Oral Q24H  . feeding supplement  30 mL Oral TID WC  . insulin aspart  0-20 Units Subcutaneous TID WC  . insulin aspart  0-5 Units Subcutaneous QHS  . insulin aspart protamine- aspart  34 Units Subcutaneous Q supper  . insulin aspart protamine- aspart  40 Units Subcutaneous Q breakfast  . levothyroxine  150 mcg Oral QAC breakfast  . linezolid  600 mg Intravenous Q12H  . multivitamin with minerals  1 tablet Oral Daily  . pantoprazole  40 mg Oral Daily  . piperacillin-tazobactam (ZOSYN)  IV  3.375 g Intravenous Q8H   Continuous Infusions: . sodium chloride 100 mL/hr at 02/06/13 2234    Principal Problem:   Sepsis Active Problems:   DM (diabetes mellitus)   CKD (chronic kidney disease), stage III   Cellulitis   Morbid obesity   ARF (acute renal failure)   Diarrhea   Hyponatremia   Hypokalemia    Time spent: 35 minutes.     Gordon Carlson  [facility] Pager [phone]. If 7PM-7AM, please contact night-coverage at [email or web address], password [facility] 02/07/2013, 9:47 AM  LOS: 5 days

## 2013-02-07 NOTE — Progress Notes (Signed)
Patient ID: [name] [name], female   DOB: 1951-06-18, 62 y.o.   MRN: [id number] S:resting comfortably O:BP 148/56  Pulse 96  Temp(Src) 97.9 F (36.6 C) (Oral)  Resp 20  Ht 5\' 4"  (1.626 m)  Wt 137.8 kg (303 lb 12.7 oz)  BMI 52.12 kg/m2  SpO2 95%  Intake/Output Summary (Last 24 hours) at 02/07/13 0916 Last data filed at 02/07/13 0600  Gross per 24 hour  Intake 2343.33 ml  Output   2275 ml  Net  68.33 ml   Intake/Output: I/O last 3 completed shifts: In: 3668.3 [I.V.:3368.3; IV Piggyback:300] Out: 2875 [Urine:2850; Blood:25]  Intake/Output this shift:    Weight change:  Gen:WD morbidly obese AAF lying in bed in NAD CVS:no rub Resp:CTA with decreased air movement at bases [id number] obese, NT Ext:no edema, Left gluteal abscess +induration/tenderness   Recent Labs Lab 02/02/13 0850 02/02/13 1447 02/02/13 2102 02/03/13 0515 02/04/13 0358 02/05/13 0415 02/06/13 0450 02/07/13 0509  NA 130*  --  130* 128* 130* 130* 131* 135  K 2.9*  --  3.8 4.1 4.0 3.8 3.9 4.0  CL 93*  --  97 96 98 99 100 104  CO2 22  --  20 20 19 21 19 20   GLUCOSE 231*  --  291* 291* 242* 191* 125* 119*  BUN 28*  --  35* 41* 48* 53* 57* 45*  CREATININE 1.77*  --  2.23* 2.40* 2.53* 2.79* 2.65* 1.82*  ALBUMIN 2.5*  --   --  1.9* 1.7*  --   --  1.5*  CALCIUM 9.6  --  8.7 8.8 9.3 9.1 9.1 9.1  PHOS  --  2.9  --   --  4.1  --   --  4.0  AST 45*  --   --  36  --   --   --   --   ALT 41*  --   --  37*  --   --   --   --    Liver Function Tests:  Recent Labs Lab 02/02/13 0850 02/03/13 0515 02/04/13 0358 02/07/13 0509  AST 45* 36  --   --   ALT 41* 37*  --   --   ALKPHOS 123* 122*  --   --   BILITOT 1.2 1.0  --   --   PROT 7.0 5.8*  --   --   ALBUMIN 2.5* 1.9* 1.7* 1.5*    Recent Labs Lab 02/02/13 0850  LIPASE 17   No results found for this basename: AMMONIA,  in the last 168 hours CBC:  Recent Labs Lab 02/02/13 0850 02/03/13 0515 02/04/13 0358 02/05/13 0415 02/06/13 0450  02/07/13 0509  WBC 23.3* 20.3* 21.1* 23.4* 26.3* 22.3*  NEUTROABS 21.1*  --   --   --   --   --   HGB 11.4* 9.9* 10.5* 9.2* 9.2* 8.9*  HCT 33.9* 29.7* 30.9* 27.4* 27.7* 27.1*  MCV 78.5 78.0 78.0 77.6* 78.2 78.1  PLT 209 193 212 249 273 268   Cardiac Enzymes:  Recent Labs Lab 02/02/13 0850 02/03/13 1632  CKTOTAL  --  158  TROPONINI <0.30  --    CBG:  Recent Labs Lab 02/06/13 1227 02/06/13 1434 02/06/13 1725 02/06/13 2252 02/07/13 0805  GLUCAP 110* 118* 138* 105* 96    Iron Studies: No results found for this basename: IRON, TIBC, TRANSFERRIN, FERRITIN,  in the last 72 hours Studies/Results: Mr Pelvis Wo Contrast  02/07/2013  *RADIOLOGY REPORT*  Clinical Data: Follow-up pelvic  fluid collection.  MRI PELVIS WITHOUT CONTRAST  Technique:  Multi-planar multi-sequence MR imaging of the pelvis was performed following the standard protocol. No intravenous contrast was administered.  Comparison: Pelvic CT 02/02/2013.  Findings: Study is mildly motion degraded, although sufficiently diagnostic.  A Foley catheter has been placed with decompression of the urinary bladder.  The previously demonstrated fluid collection is no longer present.  There is no significant free pelvic fluid. There are some dilated loops of small bowel within the false pelvis.  There is no evidence of pelvic mass or lymphadenopathy.  Subcutaneous edema is present within the anterior pelvic soft tissues.  No focal hernia is identified. There is there is also altered signal within the subcutaneous fat in the inferior left gluteal region where there are two new focal areas of low T2 and intermediate T1 signal.  This is incompletely visualized on this examination and was not included on the prior pelvic CT.  Patient is status post right total hip arthroplasty with associated susceptibility artifact.  No acute osseous findings are identified. Mild disc bulging is present in the lower lumbar spine.  IMPRESSION:  1.  Interval  resolution of intra pelvic fluid collection. 2.  Subcutaneous edema anteriorly with possible atypical fluid or air collections in the inferior left gluteal region. Is there a decubitus ulcer in this area or clinical concern of perianal pathology?  This is incompletely visualized by the current examination and requires clinical correlation.   Follow-up pelvic CT with more inferior imaging may be helpful especially if this is the site of the palpable concern previously evaluated by ultrasound.   Original Report Authenticated By: Richardean Sale, M.D.    . allopurinol  100 mg Oral Daily  . cilostazol  100 mg Oral BID  . darifenacin  7.5 mg Oral Daily  . enoxaparin (LOVENOX) injection  40 mg Subcutaneous Q24H  . feeding supplement  237 mL Oral Q24H  . feeding supplement  30 mL Oral TID WC  . insulin aspart  0-20 Units Subcutaneous TID WC  . insulin aspart  0-5 Units Subcutaneous QHS  . insulin aspart protamine- aspart  34 Units Subcutaneous Q supper  . insulin aspart protamine- aspart  40 Units Subcutaneous Q breakfast  . levothyroxine  150 mcg Oral QAC breakfast  . linezolid  600 mg Intravenous Q12H  . multivitamin with minerals  1 tablet Oral Daily  . pantoprazole  40 mg Oral Daily  . piperacillin-tazobactam (ZOSYN)  IV  3.375 g Intravenous Q8H    BMET    Component Value Date/Time   NA 135 02/07/2013 0509   K 4.0 02/07/2013 0509   CL 104 02/07/2013 0509   CO2 20 02/07/2013 0509   GLUCOSE 119* 02/07/2013 0509   BUN 45* 02/07/2013 0509   CREATININE 1.82* 02/07/2013 0509   CALCIUM 9.1 02/07/2013 0509   GFRNONAA 29* 02/07/2013 0509   GFRAA 33* 02/07/2013 0509   CBC    Component Value Date/Time   WBC 22.3* 02/07/2013 0509   RBC 3.47* 02/07/2013 0509   HGB 8.9* 02/07/2013 0509   HCT 27.1* 02/07/2013 0509   PLT 268 02/07/2013 0509   MCV 78.1 02/07/2013 0509   MCH 25.6* 02/07/2013 0509   MCHC 32.8 02/07/2013 0509   RDW 16.4* 02/07/2013 0509   LYMPHSABS 1.0 02/02/2013 0850   MONOABS 1.2* 02/02/2013 0850   EOSABS 0.0  02/02/2013 0850   BASOSABS 0.0 02/02/2013 0850     Assessment/Plan:  1. AKI/CKD- continuing to see an improvement in  BUN/Scr. Increasing UOP and significant drop in Scr. Cont to follow.  1. non-oliguric. Pt was hypotensive, volume depleted and received IV contrast, therefore likely combo of ischemic ATN further c/b contrast-induced nephropathy.  2. SIRS- Microaerophillic strep from gluteal abscess..  On zosyn /zyvox.  Should narrow spectrum of abx if ok with primary svc and surgery.  Urine and blood cultures negative thus far. Off Vanco.  ? Need for linezolid.   3. Gluteal abscess. S/p I&D 4. DM- per primary svc 5. HTN- now hypotensive with early SIRS. 6. Hypokalemia- repleted and would be careful not to overshoot correction. 7. Gout- stble 8. Hyponatremia- related to AKI. Now resolved. 9. ABLA- guaiac stool and follow 10. Diarrhea- resolved. 11. Disposition- will need further surgical debridement/irrigation  Xzavian Semmel A

## 2013-02-07 NOTE — Op Note (Signed)
[name] [name], [name] [name]             ACCOUNT NO.:  [id number]  MEDICAL RECORD NO.:  [id number]  LOCATION:  6N08C                        FACILITY:  [facility]  PHYSICIAN:  [name], M.D. DATE OF BIRTH:  [birth year]  DATE OF PROCEDURE:  02/06/2013 DATE OF DISCHARGE:                              OPERATIVE REPORT   PREOPERATIVE DIAGNOSIS:  Left buttock abscess.  POSTOPERATIVE DIAGNOSIS:  Left buttock abscess.  PROCEDURE:  Irrigation and debridement and dressing change of left buttock abscess.  SURGEON:  [name], M.D.  ANESTHESIA:  General and 0.5% Marcaine.  ESTIMATED BLOOD LOSS:  Minimal.  INDICATIONS:  This is a 62 year old female, who is 48 hours ago underwent I and D, as well as debridement of a large left buttock abscess with necrotic tissue.  She returns to the operating room today for further debridement and dressing change.  FINDINGS:  The patient was found to have worsening purulence with necrotic debris going posteriorly as well as distally along the thigh and buttock.  PROCEDURE IN DETAIL:  The patient was brought to the operating room, identified as [name].  She was placed on the operating table and general anesthesia was induced.  She was then placed in lithotomy position.  The packing from her previous wound was then completely removed.  After probing, the wound had further ongoing purulence and necrotic tissue.  This is mostly a subcutaneous fat and some muscle which was debrided with the electrocautery.  Surprisingly, the tissue bled very well.  Once the necrotic tissue was cut away and the purulence was irrigated free, I then packed the wound with a Betadine-soaked Kerlix.  I placed another dry Kerlix on top of this.  Prior to this, I anesthetized the wound with Marcaine.  The patient was then extubated in the operating room and taken in a guarded condition back to the recovery room.     [name], M.D.     DB/MEDQ  D:   02/06/2013  T:  02/07/2013  Job:  [id number]

## 2013-02-07 NOTE — Progress Notes (Signed)
Sepsis  Assessment: S/P drainage of large complex left buttock abscess Developing some skin breakdown, likely from tape and pressure  Plan: External dressing changed today and a small amount of packing removed Will leave Foley in as she needs to keep area dry and not leak urine Will need to go to OR tomorrow for complete dressing change under anesthesia Will use mesh "panties" to keep dressings on as tape causing skin damage   Subjective: Still having moderate pain  Objective: Vital signs in last 24 hours: Temp:  [97.8 F (36.6 C)-98.4 F (36.9 C)] 97.9 F (36.6 C) (05/03 0648) Pulse Rate:  [92-104] 96 (05/03 0648) Resp:  [12-20] 20 (05/03 0648) BP: (105-156)/(51-110) 148/56 mmHg (05/03 0648) SpO2:  [92 %-100 %] 95 % (05/03 0648) Last BM Date: 02/02/13  Intake/Output from previous day: 05/02 0701 - 05/03 0700 In: 2343.3 [I.V.:2343.3] Out: 2275 [Urine:2250; Blood:25]  General appearance: alert, cooperative and mild distress GI: soft, non-tender; bowel sounds normal; no masses,  no organomegaly Wound: Dressings saturated, mainly stained with betadine and old blood. Still tender externally, painful to manipulate dressing much, some early skin breakdown above and lateral to dressing, suspect combination tape, moisture, and pressure Lab Results:  Results for orders placed during the hospital encounter of 02/02/13 (from the past 24 hour(s))  GLUCOSE, CAPILLARY     Status: Abnormal   Collection Time    02/06/13 10:59 AM      Result Value Range   Glucose-Capillary 119 (*) 70 - 99 mg/dL  GLUCOSE, CAPILLARY     Status: Abnormal   Collection Time    02/06/13 12:27 PM      Result Value Range   Glucose-Capillary 110 (*) 70 - 99 mg/dL   Comment 1 Documented in Chart     Comment 2 Notify RN    GLUCOSE, CAPILLARY     Status: Abnormal   Collection Time    02/06/13  2:34 PM      Result Value Range   Glucose-Capillary 118 (*) 70 - 99 mg/dL   Comment 1 Notify RN    GLUCOSE,  CAPILLARY     Status: Abnormal   Collection Time    02/06/13  5:25 PM      Result Value Range   Glucose-Capillary 138 (*) 70 - 99 mg/dL   Comment 1 Notify RN    GLUCOSE, CAPILLARY     Status: Abnormal   Collection Time    02/06/13 10:52 PM      Result Value Range   Glucose-Capillary 105 (*) 70 - 99 mg/dL  RENAL FUNCTION PANEL     Status: Abnormal   Collection Time    02/07/13  5:09 AM      Result Value Range   Sodium 135  135 - 145 mEq/L   Potassium 4.0  3.5 - 5.1 mEq/L   Chloride 104  96 - 112 mEq/L   CO2 20  19 - 32 mEq/L   Glucose, Bld 119 (*) 70 - 99 mg/dL   BUN 45 (*) 6 - 23 mg/dL   Creatinine, Ser 1.82 (*) 0.50 - 1.10 mg/dL   Calcium 9.1  8.4 - 10.5 mg/dL   Phosphorus 4.0  2.3 - 4.6 mg/dL   Albumin 1.5 (*) 3.5 - 5.2 g/dL   GFR calc non Af Amer 29 (*) >90 mL/min   GFR calc Af Amer 33 (*) >90 mL/min  CBC     Status: Abnormal   Collection Time    02/07/13  5:09 AM  Result Value Range   WBC 22.3 (*) 4.0 - 10.5 K/uL   RBC 3.47 (*) 3.87 - 5.11 MIL/uL   Hemoglobin 8.9 (*) 12.0 - 15.0 g/dL   HCT 27.1 (*) 36.0 - 46.0 %   MCV 78.1  78.0 - 100.0 fL   MCH 25.6 (*) 26.0 - 34.0 pg   MCHC 32.8  30.0 - 36.0 g/dL   RDW 16.4 (*) 11.5 - 15.5 %   Platelets 268  150 - 400 K/uL  GLUCOSE, CAPILLARY     Status: None   Collection Time    02/07/13  8:05 AM      Result Value Range   Glucose-Capillary 96  70 - 99 mg/dL   Comment 1 Notify RN       Studies/Results Radiology     MEDS, Scheduled . allopurinol  100 mg Oral Daily  . cilostazol  100 mg Oral BID  . darifenacin  7.5 mg Oral Daily  . enoxaparin (LOVENOX) injection  40 mg Subcutaneous Q24H  . feeding supplement  237 mL Oral Q24H  . feeding supplement  30 mL Oral TID WC  . insulin aspart  0-20 Units Subcutaneous TID WC  . insulin aspart  0-5 Units Subcutaneous QHS  . insulin aspart protamine- aspart  34 Units Subcutaneous Q supper  . insulin aspart protamine- aspart  40 Units Subcutaneous Q breakfast  .  levothyroxine  150 mcg Oral QAC breakfast  . linezolid  600 mg Intravenous Q12H  . multivitamin with minerals  1 tablet Oral Daily  . pantoprazole  40 mg Oral Daily  . piperacillin-tazobactam (ZOSYN)  IV  3.375 g Intravenous Q8H       LOS: 5 days    Haywood Lasso, MD, [facility], [place]   02/07/2013 8:24 AM

## 2013-02-08 ENCOUNTER — Encounter (HOSPITAL_COMMUNITY): Payer: Self-pay | Admitting: Certified Registered Nurse Anesthetist

## 2013-02-08 ENCOUNTER — Inpatient Hospital Stay (HOSPITAL_COMMUNITY): Payer: PRIVATE HEALTH INSURANCE | Admitting: Certified Registered Nurse Anesthetist

## 2013-02-08 ENCOUNTER — Encounter (HOSPITAL_COMMUNITY)
Admission: EM | Disposition: A | Discharge status: Skilled Nursing Facility | Payer: Self-pay | Source: Home / Self Care | Attending: Internal Medicine

## 2013-02-08 HISTORY — PX: IRRIGATION AND DEBRIDEMENT ABSCESS: SHX5252

## 2013-02-08 LAB — RENAL FUNCTION PANEL
Albumin: 1.6 g/dL — ABNORMAL LOW (ref 3.5–5.2)
BUN: 31 mg/dL — ABNORMAL HIGH (ref 6–23)
CO2: 23 mEq/L (ref 19–32)
Calcium: 9.3 mg/dL (ref 8.4–10.5)
Chloride: 107 mEq/L (ref 96–112)
Creatinine, Ser: 1.2 mg/dL — ABNORMAL HIGH (ref 0.50–1.10)
GFR calc Af Amer: 55 mL/min — ABNORMAL LOW (ref >90)
GFR calc non Af Amer: 47 mL/min — ABNORMAL LOW (ref >90)
Glucose, Bld: 161 mg/dL — ABNORMAL HIGH (ref 70–99)
Phosphorus: 3.1 mg/dL (ref 2.3–4.6)
Potassium: 4.1 mEq/L (ref 3.5–5.1)
Sodium: 138 mEq/L (ref 135–145)

## 2013-02-08 LAB — GLUCOSE, CAPILLARY
Glucose-Capillary: 150 mg/dL — ABNORMAL HIGH (ref 70–99)
Glucose-Capillary: 151 mg/dL — ABNORMAL HIGH (ref 70–99)
Glucose-Capillary: 155 mg/dL — ABNORMAL HIGH (ref 70–99)
Glucose-Capillary: 194 mg/dL — ABNORMAL HIGH (ref 70–99)
Glucose-Capillary: 158 mg/dL — ABNORMAL HIGH (ref 70–99)

## 2013-02-08 LAB — CBC
HCT: 26.8 % — ABNORMAL LOW (ref 36.0–46.0)
Hemoglobin: 9 g/dL — ABNORMAL LOW (ref 12.0–15.0)
MCH: 25.9 pg — ABNORMAL LOW (ref 26.0–34.0)
MCHC: 33.6 g/dL (ref 30.0–36.0)
MCV: 77.2 fL — ABNORMAL LOW (ref 78.0–100.0)
Platelets: 300 10*3/uL (ref 150–400)
RBC: 3.47 MIL/uL — ABNORMAL LOW (ref 3.87–5.11)
RDW: 16.4 % — ABNORMAL HIGH (ref 11.5–15.5)
WBC: 20.2 10*3/uL — ABNORMAL HIGH (ref 4.0–10.5)

## 2013-02-08 LAB — CULTURE, BLOOD (ROUTINE X 2)
Culture: NO GROWTH
Culture: NO GROWTH

## 2013-02-08 SURGERY — IRRIGATION AND DEBRIDEMENT ABSCESS
Anesthesia: General | Site: Buttocks | Laterality: Left | Wound class: Dirty or Infected

## 2013-02-08 MED ORDER — OXYCODONE HCL 5 MG PO TABS: 5.0000 mg | ORAL_TABLET | Freq: Once | ORAL | Status: DC | PRN

## 2013-02-08 MED ORDER — ALBUTEROL SULFATE (5 MG/ML) 0.5% IN NEBU
INHALATION_SOLUTION | RESPIRATORY_TRACT | Status: AC
  Filled 2013-02-08: qty 0.5

## 2013-02-08 MED ORDER — OXYCODONE HCL 5 MG/5ML PO SOLN: 5.0000 mg | Freq: Once | ORAL | Status: DC | PRN

## 2013-02-08 MED ORDER — OXYCODONE HCL 5 MG PO TABS
ORAL_TABLET | ORAL | Status: AC
  Filled 2013-02-08: qty 2

## 2013-02-08 MED ORDER — ONDANSETRON HCL 4 MG/2ML IJ SOLN: 4.0000 mg | Freq: Once | INTRAMUSCULAR | Status: DC | PRN

## 2013-02-08 MED ORDER — AMLODIPINE BESYLATE 5 MG PO TABS
5.0000 mg | ORAL_TABLET | Freq: Every day | ORAL | Status: DC
  Administered 2013-02-08 – 2013-02-14 (×6): 5 mg via ORAL
  Filled 2013-02-08 (×7): qty 1

## 2013-02-08 MED ORDER — LIDOCAINE HCL (CARDIAC) 20 MG/ML IV SOLN
INTRAVENOUS | Status: DC | PRN
  Administered 2013-02-08: 80 mg via INTRAVENOUS

## 2013-02-08 MED ORDER — METOPROLOL TARTRATE 1 MG/ML IV SOLN
INTRAVENOUS | Status: DC | PRN
  Administered 2013-02-08 (×2): 1 mg via INTRAVENOUS
  Administered 2013-02-08: 2 mg via INTRAVENOUS
  Administered 2013-02-08: 1 mg via INTRAVENOUS

## 2013-02-08 MED ORDER — INSULIN GLARGINE 100 UNIT/ML ~~LOC~~ SOLN
10.0000 [IU] | Freq: Every day | SUBCUTANEOUS | Status: DC
  Administered 2013-02-08 – 2013-02-13 (×6): 10 [IU] via SUBCUTANEOUS
  Filled 2013-02-08 (×7): qty 0.1

## 2013-02-08 MED ORDER — FENTANYL CITRATE 0.05 MG/ML IJ SOLN
INTRAMUSCULAR | Status: DC | PRN
  Administered 2013-02-08: 50 ug via INTRAVENOUS
  Administered 2013-02-08: 100 ug via INTRAVENOUS
  Administered 2013-02-08 (×2): 50 ug via INTRAVENOUS

## 2013-02-08 MED ORDER — 0.9 % SODIUM CHLORIDE (POUR BTL) OPTIME
TOPICAL | Status: DC | PRN
  Administered 2013-02-08: 1000 mL

## 2013-02-08 MED ORDER — PROPOFOL 10 MG/ML IV BOLUS
INTRAVENOUS | Status: DC | PRN
  Administered 2013-02-08: 200 mg via INTRAVENOUS

## 2013-02-08 MED ORDER — SUCCINYLCHOLINE CHLORIDE 20 MG/ML IJ SOLN
INTRAMUSCULAR | Status: DC | PRN
  Administered 2013-02-08: 140 mg via INTRAVENOUS

## 2013-02-08 MED ORDER — HYDROMORPHONE HCL PF 1 MG/ML IJ SOLN
0.2500 mg | INTRAMUSCULAR | Status: DC | PRN
  Administered 2013-02-08 (×4): 0.5 mg via INTRAVENOUS

## 2013-02-08 MED ORDER — HYDROMORPHONE HCL PF 1 MG/ML IJ SOLN
INTRAMUSCULAR | Status: AC
  Filled 2013-02-08: qty 1

## 2013-02-08 SURGICAL SUPPLY — 30 items
BANDAGE GAUZE ELAST BULKY 4 IN (GAUZE/BANDAGES/DRESSINGS) ×6 IMPLANT
CANISTER SUCTION 2500CC (MISCELLANEOUS) IMPLANT
CLOTH BEACON ORANGE TIMEOUT ST (SAFETY) ×2 IMPLANT
COVER SURGICAL LIGHT HANDLE (MISCELLANEOUS) ×2 IMPLANT
DRAPE LAPAROSCOPIC ABDOMINAL (DRAPES) IMPLANT
DRAPE PED LAPAROTOMY (DRAPES) IMPLANT
DRAPE UTILITY 15X26 W/TAPE STR (DRAPE) ×4 IMPLANT
DRSG PAD ABDOMINAL 8X10 ST (GAUZE/BANDAGES/DRESSINGS) ×2 IMPLANT
ELECT CAUTERY BLADE 6.4 (BLADE) ×2 IMPLANT
ELECT REM PT RETURN 9FT ADLT (ELECTROSURGICAL) ×2
ELECTRODE REM PT RTRN 9FT ADLT (ELECTROSURGICAL) ×1 IMPLANT
FLUID NSS /IRRIG 3000 ML XXX (IV SOLUTION) ×2 IMPLANT
GLOVE BIOGEL PI IND STRL 8 (GLOVE) ×1 IMPLANT
GLOVE BIOGEL PI INDICATOR 8 (GLOVE) ×1
GLOVE ECLIPSE 8.0 STRL XLNG CF (GLOVE) ×2 IMPLANT
GOWN STRL NON-REIN LRG LVL3 (GOWN DISPOSABLE) ×4 IMPLANT
HANDPIECE INTERPULSE COAX TIP (DISPOSABLE) ×1
KIT BASIN OR (CUSTOM PROCEDURE TRAY) ×2 IMPLANT
KIT ROOM TURNOVER OR (KITS) ×2 IMPLANT
LEGGING LITHOTOMY PAIR STRL (DRAPES) ×2 IMPLANT
NS IRRIG 1000ML POUR BTL (IV SOLUTION) ×2 IMPLANT
PACK GENERAL/GYN (CUSTOM PROCEDURE TRAY) ×2 IMPLANT
PAD ARMBOARD 7.5X6 YLW CONV (MISCELLANEOUS) ×2 IMPLANT
SET HNDPC FAN SPRY TIP SCT (DISPOSABLE) ×1 IMPLANT
SPONGE GAUZE 4X4 12PLY (GAUZE/BANDAGES/DRESSINGS) ×2 IMPLANT
SWAB COLLECTION DEVICE MRSA (MISCELLANEOUS) IMPLANT
TAPE CLOTH SURG 6X10 WHT LF (GAUZE/BANDAGES/DRESSINGS) ×2 IMPLANT
TOWEL OR 17X24 6PK STRL BLUE (TOWEL DISPOSABLE) ×2 IMPLANT
TOWEL OR 17X26 10 PK STRL BLUE (TOWEL DISPOSABLE) ×2 IMPLANT
TUBE ANAEROBIC SPECIMEN COL (MISCELLANEOUS) IMPLANT

## 2013-02-08 NOTE — Progress Notes (Signed)
Became disoriented,trying to get OOB,wanting to go home and pulled IV out . MD aware.Tolerating po pain med.

## 2013-02-08 NOTE — Transfer of Care (Signed)
Immediate Anesthesia Transfer of Care Note  Patient: [name] [name]  Procedure(s) Performed: Procedure(s): IRRIGATION AND DEBRIDEMENT ABSCESS/DRESSING CHANGE (Left)  Patient Location: PACU  Anesthesia Type:General  Level of Consciousness: awake  Airway & Oxygen Therapy: Patient Spontanous Breathing and Patient connected to nasal cannula oxygen  Post-op Assessment: Report given to PACU RN and Post -op Vital signs reviewed and stable  Post vital signs: Reviewed and stable  Complications: No apparent anesthesia complications

## 2013-02-08 NOTE — Progress Notes (Signed)
Patient ID: [name] [name], female   DOB: May 21, 1951, 62 y.o.   MRN: [id number] S:AMS/agitation of last night noted. Lethargic but arousable and oriented today O:BP 163/71  Pulse 102  Temp(Src) 98.4 F (36.9 C) (Axillary)  Resp 19  Ht 5\' 4"  (1.626 m)  Wt 150.4 kg (331 lb 9.2 oz)  BMI 56.89 kg/m2  SpO2 95%  Intake/Output Summary (Last 24 hours) at 02/08/13 0857 Last data filed at 02/08/13 [id number]  Gross per 24 hour  Intake      0 ml  Output   2100 ml  Net  -2100 ml   Intake/Output: I/O last 3 completed shifts: In: 1643.3 [I.V.:1643.3] Out: 3600 [Urine:3600]  Intake/Output this shift:    Weight change:  Gen:WD obese AAF VSS Ext- no edema   Recent Labs Lab 02/02/13 0850 02/02/13 1447 02/02/13 2102 02/03/13 0515 02/04/13 0358 02/05/13 0415 02/06/13 0450 02/07/13 0509 02/08/13 0645  NA 130*  --  130* 128* 130* 130* 131* 135 138  K 2.9*  --  3.8 4.1 4.0 3.8 3.9 4.0 4.1  CL 93*  --  97 96 98 99 100 104 107  CO2 22  --  20 20 19 21 19 20 23   GLUCOSE 231*  --  291* 291* 242* 191* 125* 119* 161*  BUN 28*  --  35* 41* 48* 53* 57* 45* 31*  CREATININE 1.77*  --  2.23* 2.40* 2.53* 2.79* 2.65* 1.82* 1.20*  ALBUMIN 2.5*  --   --  1.9* 1.7*  --   --  1.5* 1.6*  CALCIUM 9.6  --  8.7 8.8 9.3 9.1 9.1 9.1 9.3  PHOS  --  2.9  --   --  4.1  --   --  4.0 3.1  AST 45*  --   --  36  --   --   --   --   --   ALT 41*  --   --  37*  --   --   --   --   --    Liver Function Tests:  Recent Labs Lab 02/02/13 0850 02/03/13 0515 02/04/13 0358 02/07/13 0509 02/08/13 0645  AST 45* 36  --   --   --   ALT 41* 37*  --   --   --   ALKPHOS 123* 122*  --   --   --   BILITOT 1.2 1.0  --   --   --   PROT 7.0 5.8*  --   --   --   ALBUMIN 2.5* 1.9* 1.7* 1.5* 1.6*    Recent Labs Lab 02/02/13 0850  LIPASE 17   No results found for this basename: AMMONIA,  in the last 168 hours CBC:  Recent Labs Lab 02/02/13 0850  02/04/13 0358 02/05/13 0415 02/06/13 0450 02/07/13 0509 02/08/13 0645   WBC 23.3*  < > 21.1* 23.4* 26.3* 22.3* 20.2*  NEUTROABS 21.1*  --   --   --   --   --   --   HGB 11.4*  < > 10.5* 9.2* 9.2* 8.9* 9.0*  HCT 33.9*  < > 30.9* 27.4* 27.7* 27.1* 26.8*  MCV 78.5  < > 78.0 77.6* 78.2 78.1 77.2*  PLT 209  < > 212 249 273 268 300  < > = values in this interval not displayed. Cardiac Enzymes:  Recent Labs Lab 02/02/13 0850 02/03/13 1632  CKTOTAL  --  158  TROPONINI <0.30  --    CBG:  Recent Labs  Lab 02/07/13 0805 02/07/13 1222 02/07/13 1732 02/07/13 2158 02/08/13 0752  GLUCAP 96 99 130* 153* 150*    Iron Studies: No results found for this basename: IRON, TIBC, TRANSFERRIN, FERRITIN,  in the last 72 hours Studies/Results: Mr Pelvis Wo Contrast  02/07/2013  *RADIOLOGY REPORT*  Clinical Data: Follow-up pelvic fluid collection.  MRI PELVIS WITHOUT CONTRAST  Technique:  Multi-planar multi-sequence MR imaging of the pelvis was performed following the standard protocol. No intravenous contrast was administered.  Comparison: Pelvic CT 02/02/2013.  Findings: Study is mildly motion degraded, although sufficiently diagnostic.  A Foley catheter has been placed with decompression of the urinary bladder.  The previously demonstrated fluid collection is no longer present.  There is no significant free pelvic fluid. There are some dilated loops of small bowel within the false pelvis.  There is no evidence of pelvic mass or lymphadenopathy.  Subcutaneous edema is present within the anterior pelvic soft tissues.  No focal hernia is identified. There is there is also altered signal within the subcutaneous fat in the inferior left gluteal region where there are two new focal areas of low T2 and intermediate T1 signal.  This is incompletely visualized on this examination and was not included on the prior pelvic CT.  Patient is status post right total hip arthroplasty with associated susceptibility artifact.  No acute osseous findings are identified. Mild disc bulging is present in  the lower lumbar spine.  IMPRESSION:  1.  Interval resolution of intra pelvic fluid collection. 2.  Subcutaneous edema anteriorly with possible atypical fluid or air collections in the inferior left gluteal region. Is there a decubitus ulcer in this area or clinical concern of perianal pathology?  This is incompletely visualized by the current examination and requires clinical correlation.   Follow-up pelvic CT with more inferior imaging may be helpful especially if this is the site of the palpable concern previously evaluated by ultrasound.   Original Report Authenticated By: Richardean Sale, M.D.    . allopurinol  100 mg Oral Daily  . cilostazol  100 mg Oral BID  . darifenacin  7.5 mg Oral Daily  . enoxaparin (LOVENOX) injection  40 mg Subcutaneous Q24H  . feeding supplement  237 mL Oral Q24H  . feeding supplement  30 mL Oral TID WC  . insulin aspart  0-20 Units Subcutaneous TID WC  . levothyroxine  150 mcg Oral QAC breakfast  . linezolid  600 mg Intravenous Q12H  . multivitamin with minerals  1 tablet Oral Daily  . pantoprazole  40 mg Oral Daily  . piperacillin-tazobactam (ZOSYN)  IV  3.375 g Intravenous Q8H  . polyethylene glycol  17 g Oral BID    BMET    Component Value Date/Time   NA 138 02/08/2013 0645   K 4.1 02/08/2013 0645   CL 107 02/08/2013 0645   CO2 23 02/08/2013 0645   GLUCOSE 161* 02/08/2013 0645   BUN 31* 02/08/2013 0645   CREATININE 1.20* 02/08/2013 0645   CALCIUM 9.3 02/08/2013 0645   GFRNONAA 47* 02/08/2013 0645   GFRAA 55* 02/08/2013 0645   CBC    Component Value Date/Time   WBC 20.2* 02/08/2013 0645   RBC 3.47* 02/08/2013 0645   HGB 9.0* 02/08/2013 0645   HCT 26.8* 02/08/2013 0645   PLT 300 02/08/2013 0645   MCV 77.2* 02/08/2013 0645   MCH 25.9* 02/08/2013 0645   MCHC 33.6 02/08/2013 0645   RDW 16.4* 02/08/2013 0645   LYMPHSABS 1.0 02/02/2013 0850   MONOABS  1.2* 02/02/2013 0850   EOSABS 0.0 02/02/2013 0850   BASOSABS 0.0 02/02/2013 0850     Assessment/Plan:  1. AKI/CKD- non-oliguric.  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 Pt was hypotensive, volume depleted and received IV contrast, therefore likely combo of ischemic ATN further c/b contrast-induced nephropathy.  2. SIRS- Microaerophillic strep from gluteal abscess.. On zosyn /zyvox. Should narrow spectrum of abx if ok with primary svc and surgery. Urine and blood cultures negative thus far. Off Vanco. ? Need for linezolid.  3. Gluteal abscess. S/p I&D 4. DM- per primary svc 5. HTN- now hypotensive with early SIRS. 6. Hypokalemia- repleted and would be careful not to overshoot correction. 7. Gout- stble 8. Hyponatremia- related to AKI. Now resolved. 9. ABLA- guaiac stool and follow 10. Diarrhea- resolved. 11. Disposition- will need further surgical debridement/irrigation.  Nothing further to add.  Will sign off.  No need for outpt followup as her renal function is returning to baseline. Call with any questions/concerns   Audiel Scheiber A

## 2013-02-08 NOTE — Anesthesia Preprocedure Evaluation (Addendum)
Anesthesia Evaluation  Patient identified by MRN, date of birth, ID band Patient awake    Reviewed: Allergy & Precautions, H&P , NPO status , Patient's Chart, lab work & pertinent test results  Airway Mallampati: I TM Distance: >3 FB Neck ROM: Full    Dental  (+) Upper Dentures and Dental Advisory Given   Pulmonary  breath sounds clear to auscultation        Cardiovascular hypertension, Pt. on medications Rhythm:Regular Rate:Normal     Neuro/Psych    GI/Hepatic GERD-  Medicated and Controlled,  Endo/Other  diabetes, Poorly Controlled, Type 2, Insulin DependentMorbid obesity  Renal/GU Renal disease     Musculoskeletal   Abdominal   Peds  Hematology   Anesthesia Other Findings Removed upper denture plate prior to coming to holding area.  Reproductive/Obstetrics                          Anesthesia Physical Anesthesia Plan  ASA: III  Anesthesia Plan: General   Post-op Pain Management:    Induction: Intravenous  Airway Management Planned: Oral ETT  Additional Equipment:   Intra-op Plan:   Post-operative Plan: Extubation in OR  Informed Consent: I have reviewed the patients History and Physical, chart, labs and discussed the procedure including the risks, benefits and alternatives for the proposed anesthesia with the patient or authorized representative who has indicated his/her understanding and acceptance.   Dental advisory given  Plan Discussed with: CRNA, Anesthesiologist and Surgeon  Anesthesia Plan Comments:         Anesthesia Quick Evaluation

## 2013-02-08 NOTE — Anesthesia Procedure Notes (Signed)
Procedure Name: Intubation Date/Time: 02/08/2013 10:51 AM Performed by: Marinda Elk A Pre-anesthesia Checklist: Patient identified, Timeout performed, Emergency Drugs available, Suction available and Patient being monitored Patient Re-evaluated:Patient Re-evaluated prior to inductionOxygen Delivery Method: Circle system utilized Preoxygenation: Pre-oxygenation with 100% oxygen Intubation Type: IV induction Laryngoscope Size: Mac and 3 Grade View: Grade I Tube type: Oral Tube size: 7.5 mm Number of attempts: 1 Airway Equipment and Method: Stylet Placement Confirmation: ETT inserted through vocal cords under direct vision,  breath sounds checked- equal and bilateral and positive ETCO2 Secured at: 21 cm Tube secured with: Tape Dental Injury: Teeth and Oropharynx as per pre-operative assessment

## 2013-02-08 NOTE — Progress Notes (Signed)
[facility] PROGRESS NOTE  [name] [name] [id number] DOB: 1951/08/18 DOA: 02/02/2013 PCP: [name], MD  Assessment/Plan: 62 y.o. female with PMH of DM, Obesity, HTN, CKD 3, who presented with c/o diarrhea and a gluteal boil. She c/o diarrhea for ~5 days, non bloodly and non bilious, associated with lower abdominal cramps, with h/o recent antibiotic use 2-3 weeks prior for UTI. In addition she noted a boil of her L buttock /perineal region for 1 week which had progressively increased in size and was very painful. She denied any drainage from it. She denied any fevers, but did report chills. In the ER she was noted to be tachycardic with HR 130s, BP was low at 73/49 (improved with IVF to low 100s), WBC of 23K and electrolyte abormalities.  Since admission the patient has been treated with IV fluid resuscitation and empiric antibiotics. General surgery was consulted and the patient has been taken to the operating room twice thus far for I and D of her gluteal abscess.  The patient stabilized and was able to be transferred to the surgical floor on 02/05/2013.    Assessment/Plan:  Sepsis due to Cellulitis - perirectal and gluteal necrotizing abscess  - sepsis resolved. - Vanc changed to Zyvox due to ARF  - s/p I&D in OR per Gen Surg 4/30,  5/2 , 5/4. - cont broad coverage. Will consider stop linezolid if ok with surgery. -Culture grew microaerophilic strep.  -Day 6 antibiotics.  -WBC trending down, but still 20 K -Plan for more I and D by surgery.   DM  Patient has not been eating well, CBG in the 150 range today. Yesterday in the 100 range. 70/30 discontinue due to concern for hypoglycemia. I will start low dose lantus. Continue with SSI.   ARF on CKD stage III  - likely combo of prerenal and ATN (contrast-induced nephropathy)  - appreciate Nephro consult  - renal fxn improving, cr decrease to 1.2. Cr peak to 2.7 Continue with IV fluids.  Diarrhea  - Resolved. Hyponatremia   - likely related to her acute renal failure as well as volume depletion  -Resolved.  Hypokalemia  - Resolved  Morbid obesity  Abnormal fluid and calcium collection in pelvis (noted via CT scan)  MRI 5-2: Interval resolution of intra pelvic fluid collection Gout : Continue with Allopurinol.  Hypertension: I will start low dose Norvasc. SBP above 160.  Constipation: Continue with Miralax.     Code Status: Full  Family Communication: Care discussed with husband who was at bedside.  Disposition Plan: to be determine.    Consultants: Nephro  Gen Surgery   Procedures: 4/30 - I&D L breast and L gluteal abscesses  5/2 - repeat I&D left gluteal abscess   Antibiotics: Zosyn- 4/29 >>  Zyvox- 4/29 >>   HPI/Subjective: Patient calm. She came from surgery. No complaints. No BM, she is passing gas.    Objective: Filed Vitals:   02/08/13 1230 02/08/13 1245 02/08/13 1300 02/08/13 1323  BP: 179/104 186/92 197/69 160/77  Pulse: 98 100 102 102  Temp:    98.6 F (37 C)  TempSrc:    Oral  Resp: 15 19 15 16   Height:      Weight:      SpO2: 100% 99% 99% 100%    Intake/Output Summary (Last 24 hours) at 02/08/13 1536 Last data filed at 02/08/13 1324  Gross per 24 hour  Intake   1300 ml  Output   2880 ml  Net  -1580 ml  Filed Weights   02/05/13 0434 02/06/13 0512 02/08/13 0605  Weight: 144 kg (317 lb 7.4 oz) 137.8 kg (303 lb 12.7 oz) 150.4 kg (331 lb 9.2 oz)    Exam:   General: Patient crying, distress because of pain.   Cardiovascular: S 1, S 2 RRR  Respiratory: CTA  Abdomen: BS present, soft, NT.   Musculoskeletal: left Buttock with dressing.   Data Reviewed: Basic Metabolic Panel:  Recent Labs Lab 02/02/13 1447  02/04/13 0358 02/05/13 0415 02/06/13 0450 02/07/13 0509 02/08/13 0645  NA  --   < > 130* 130* 131* 135 138  K  --   < > 4.0 3.8 3.9 4.0 4.1  CL  --   < > 98 99 100 104 107  CO2  --   < > 19 21 19 20 23   GLUCOSE  --   < > 242* 191* 125*  119* 161*  BUN  --   < > 48* 53* 57* 45* 31*  CREATININE  --   < > 2.53* 2.79* 2.65* 1.82* 1.20*  CALCIUM  --   < > 9.3 9.1 9.1 9.1 9.3  MG 1.5  --   --   --   --   --   --   PHOS 2.9  --  4.1  --   --  4.0 3.1  < > = values in this interval not displayed. Liver Function Tests:  Recent Labs Lab 02/02/13 0850 02/03/13 0515 02/04/13 0358 02/07/13 0509 02/08/13 0645  AST 45* 36  --   --   --   ALT 41* 37*  --   --   --   ALKPHOS 123* 122*  --   --   --   BILITOT 1.2 1.0  --   --   --   PROT 7.0 5.8*  --   --   --   ALBUMIN 2.5* 1.9* 1.7* 1.5* 1.6*    Recent Labs Lab 02/02/13 0850  LIPASE 17   No results found for this basename: AMMONIA,  in the last 168 hours CBC:  Recent Labs Lab 02/02/13 0850  02/04/13 0358 02/05/13 0415 02/06/13 0450 02/07/13 0509 02/08/13 0645  WBC 23.3*  < > 21.1* 23.4* 26.3* 22.3* 20.2*  NEUTROABS 21.1*  --   --   --   --   --   --   HGB 11.4*  < > 10.5* 9.2* 9.2* 8.9* 9.0*  HCT 33.9*  < > 30.9* 27.4* 27.7* 27.1* 26.8*  MCV 78.5  < > 78.0 77.6* 78.2 78.1 77.2*  PLT 209  < > 212 249 273 268 300  < > = values in this interval not displayed. Cardiac Enzymes:  Recent Labs Lab 02/02/13 0850 02/03/13 1632  CKTOTAL  --  158  TROPONINI <0.30  --    BNP (last 3 results) No results found for this basename: PROBNP,  in the last 8760 hours CBG:  Recent Labs Lab 02/07/13 1732 02/07/13 2158 02/08/13 0752 02/08/13 0929 02/08/13 1304  GLUCAP 130* 153* 150* 151* 155*    Recent Results (from the past 240 hour(s))  CULTURE, BLOOD (ROUTINE X 2)     Status: None   Collection Time    02/02/13 10:27 AM      Result Value Range Status   Specimen Description BLOOD FOREARM RIGHT   Final   Special Requests BOTTLES DRAWN AEROBIC ONLY 5CC   Final   Culture  Setup Time 02/02/2013 15:05   Final  Culture NO GROWTH 5 DAYS   Final   Report Status 02/08/2013 FINAL   Final  CULTURE, BLOOD (ROUTINE X 2)     Status: None   Collection Time    02/02/13  10:35 AM      Result Value Range Status   Specimen Description BLOOD HAND RIGHT   Final   Special Requests BOTTLES DRAWN AEROBIC AND ANAEROBIC 10CC   Final   Culture  Setup Time 02/02/2013 15:05   Final   Culture NO GROWTH 5 DAYS   Final   Report Status 02/08/2013 FINAL   Final  URINE CULTURE     Status: None   Collection Time    02/02/13 12:23 PM      Result Value Range Status   Specimen Description URINE, CATHETERIZED   Final   Special Requests ADDED [id number]   Final   Culture  Setup Time 02/02/2013 22:37   Final   Colony Count NO GROWTH   Final   Culture NO GROWTH   Final   Report Status 02/03/2013 FINAL   Final  URINE CULTURE     Status: None   Collection Time    02/03/13  6:08 PM      Result Value Range Status   Specimen Description URINE, CATHETERIZED   Final   Special Requests NONE   Final   Culture  Setup Time 02/03/2013 20:04   Final   Colony Count NO GROWTH   Final   Culture NO GROWTH   Final   Report Status 02/05/2013 FINAL   Final  CULTURE, ROUTINE-ABSCESS     Status: None   Collection Time    02/04/13 12:11 PM      Result Value Range Status   Specimen Description ABSCESS   Final   Special Requests LEFT GLUTEAL PT ON ZOSYN   Final   Gram Stain     Final   Value: FEW WBC PRESENT,BOTH PMN AND MONONUCLEAR     NO SQUAMOUS EPITHELIAL CELLS SEEN     MODERATE GRAM POSITIVE COCCI     IN PAIRS   Culture     Final   Value: FEW MICROAEROPHILIC STREPTOCOCCI     Note: Standardized susceptibility testing for this organism is not available.   Report Status Feb 13, 2013 FINAL   Final  ANAEROBIC CULTURE     Status: None   Collection Time    02/04/13 12:11 PM      Result Value Range Status   Specimen Description ABSCESS   Final   Special Requests LEFT GLUTEAL PT ON ZOSYN   Final   Gram Stain PENDING   Incomplete   Culture     Final   Value: NO ANAEROBES ISOLATED; CULTURE IN PROGRESS FOR 5 DAYS   Report Status PENDING   Incomplete     Studies: Mr Pelvis Wo  Contrast  13-Feb-2013  *RADIOLOGY REPORT*  Clinical Data: Follow-up pelvic fluid collection.  MRI PELVIS WITHOUT CONTRAST  Technique:  Multi-planar multi-sequence MR imaging of the pelvis was performed following the standard protocol. No intravenous contrast was administered.  Comparison: Pelvic CT 02/02/2013.  Findings: Study is mildly motion degraded, although sufficiently diagnostic.  A Foley catheter has been placed with decompression of the urinary bladder.  The previously demonstrated fluid collection is no longer present.  There is no significant free pelvic fluid. There are some dilated loops of small bowel within the false pelvis.  There is no evidence of pelvic mass or lymphadenopathy.  Subcutaneous edema is present  within the anterior pelvic soft tissues.  No focal hernia is identified. There is there is also altered signal within the subcutaneous fat in the inferior left gluteal region where there are two new focal areas of low T2 and intermediate T1 signal.  This is incompletely visualized on this examination and was not included on the prior pelvic CT.  Patient is status post right total hip arthroplasty with associated susceptibility artifact.  No acute osseous findings are identified. Mild disc bulging is present in the lower lumbar spine.  IMPRESSION:  1.  Interval resolution of intra pelvic fluid collection. 2.  Subcutaneous edema anteriorly with possible atypical fluid or air collections in the inferior left gluteal region. Is there a decubitus ulcer in this area or clinical concern of perianal pathology?  This is incompletely visualized by the current examination and requires clinical correlation.   Follow-up pelvic CT with more inferior imaging may be helpful especially if this is the site of the palpable concern previously evaluated by ultrasound.   Original Report Authenticated By: Richardean Sale, M.D.     Scheduled Meds: . albuterol      . allopurinol  100 mg Oral Daily  . cilostazol   100 mg Oral BID  . darifenacin  7.5 mg Oral Daily  . feeding supplement  237 mL Oral Q24H  . feeding supplement  30 mL Oral TID WC  . HYDROmorphone      . insulin aspart  0-20 Units Subcutaneous TID WC  . levothyroxine  150 mcg Oral QAC breakfast  . linezolid  600 mg Intravenous Q12H  . multivitamin with minerals  1 tablet Oral Daily  . oxyCODONE      . pantoprazole  40 mg Oral Daily  . piperacillin-tazobactam (ZOSYN)  IV  3.375 g Intravenous Q8H  . polyethylene glycol  17 g Oral BID   Continuous Infusions: . sodium chloride      Principal Problem:   Sepsis Active Problems:   DM (diabetes mellitus)   CKD (chronic kidney disease), stage III   Cellulitis   Morbid obesity   ARF (acute renal failure)   Diarrhea   Hyponatremia   Hypokalemia    Time spent: 35 minutes.     [name] [name],[name] [name]  [facility] Pager [phone]. If 7PM-7AM, please contact night-coverage at [email or web address], password [facility] 02/08/2013, 3:36 PM  LOS: 6 days

## 2013-02-08 NOTE — Progress Notes (Signed)
Behavior inappropriate, pulled dressings off breast and buttocks could not explain why. Remains slow to respond, refusing treatments i.e VS. Attempted to remove foley ,however could not stand the pain and has since left alone. Tearful and apologetic

## 2013-02-08 NOTE — Op Note (Signed)
OPERATIVE REPORT  DATE OF OPERATION: 02/02/2013 - 02/08/2013  PATIENT:  [name] [name]  62 y.o. female  PRE-OPERATIVE DIAGNOSIS:  Perirectal abscess  POST-OPERATIVE DIAGNOSIS:  Perirectal and gluteal necrotizing soft tissue infection abscess  PROCEDURE:  Procedure(s): IRRIGATION AND DEBRIDEMENT ABSCESS/DRESSING CHANGE  SURGEON:  Surgeon(s): Gwenyth Ober, MD  ASSISTANT: None  ANESTHESIA:   general  EBL: <50 ml  BLOOD ADMINISTERED: none  DRAINS: Urinary Catheter (Foley)   SPECIMEN:  No Specimen  COUNTS CORRECT:  YES  PROCEDURE DETAILS: The patient was taken to the operating room and placed on the table in the supine position. After an adequate endotracheal anesthetic was administered she was placed in lithotomy position and then prepped and draped in usual sterile manner exposing her perineum and her left gluteal area. After proper time out was performed identifying the patient and the procedure to be performed we removed the packing. We investigate of areas of undrained a pockets of pus. Some were noted anteriorly underneath the labia a deeper pocket was found posteriorly which was opened up with drainage of pus. We then used a pulse lavage device nor to wash out all the wounds. A total 3 L of saline were used. Once we had hemostasis controlled using electrocautery we packed the wound using a total of 3 saline soaked Kerlix gauzes. A sterile dressing was applied all counts were correct.  PATIENT DISPOSITION:  PACU - hemodynamically stable.   Gwenyth Ober 5/4/201412:01 PM

## 2013-02-08 NOTE — Progress Notes (Signed)
Dr. Marval Regal ok'd PICC line insertion, creatinine of 1.20 today.

## 2013-02-08 NOTE — Anesthesia Postprocedure Evaluation (Signed)
  Anesthesia Post-op Note  Patient: [name] [name]  Procedure(s) Performed: Procedure(s): IRRIGATION AND DEBRIDEMENT ABSCESS/DRESSING CHANGE (Left)  Patient Location: PACU  Anesthesia Type:General  Level of Consciousness: awake, alert  and oriented  Airway and Oxygen Therapy: Patient Spontanous Breathing and Patient connected to face mask oxygen  Post-op Pain: mild  Post-op Assessment: Post-op Vital signs reviewed  Post-op Vital Signs: Reviewed  Complications: No apparent anesthesia complications

## 2013-02-09 ENCOUNTER — Inpatient Hospital Stay (HOSPITAL_COMMUNITY): Payer: PRIVATE HEALTH INSURANCE

## 2013-02-09 ENCOUNTER — Encounter (HOSPITAL_COMMUNITY): Payer: Self-pay | Admitting: Surgery

## 2013-02-09 DIAGNOSIS — L03317 Cellulitis of buttock: Secondary | ICD-10-CM

## 2013-02-09 DIAGNOSIS — L0231 Cutaneous abscess of buttock: Secondary | ICD-10-CM

## 2013-02-09 LAB — CBC
HCT: 25.1 % — ABNORMAL LOW (ref 36.0–46.0)
Hemoglobin: 8 g/dL — ABNORMAL LOW (ref 12.0–15.0)
MCH: 25.2 pg — ABNORMAL LOW (ref 26.0–34.0)
MCHC: 31.9 g/dL (ref 30.0–36.0)
MCV: 79.2 fL (ref 78.0–100.0)
Platelets: 294 10*3/uL (ref 150–400)
RBC: 3.17 MIL/uL — ABNORMAL LOW (ref 3.87–5.11)
RDW: 16.3 % — ABNORMAL HIGH (ref 11.5–15.5)
WBC: 19.2 10*3/uL — ABNORMAL HIGH (ref 4.0–10.5)

## 2013-02-09 LAB — RENAL FUNCTION PANEL
Albumin: 1.5 g/dL — ABNORMAL LOW (ref 3.5–5.2)
BUN: 21 mg/dL (ref 6–23)
CO2: 22 mEq/L (ref 19–32)
Calcium: 8.8 mg/dL (ref 8.4–10.5)
Chloride: 108 mEq/L (ref 96–112)
Creatinine, Ser: 1.01 mg/dL (ref 0.50–1.10)
GFR calc Af Amer: 68 mL/min — ABNORMAL LOW (ref >90)
GFR calc non Af Amer: 58 mL/min — ABNORMAL LOW (ref >90)
Glucose, Bld: 234 mg/dL — ABNORMAL HIGH (ref 70–99)
Phosphorus: 2.8 mg/dL (ref 2.3–4.6)
Potassium: 4.2 mEq/L (ref 3.5–5.1)
Sodium: 138 mEq/L (ref 135–145)

## 2013-02-09 LAB — ANAEROBIC CULTURE

## 2013-02-09 LAB — GLUCOSE, CAPILLARY
Glucose-Capillary: 214 mg/dL — ABNORMAL HIGH (ref 70–99)
Glucose-Capillary: 185 mg/dL — ABNORMAL HIGH (ref 70–99)
Glucose-Capillary: 176 mg/dL — ABNORMAL HIGH (ref 70–99)
Glucose-Capillary: 187 mg/dL — ABNORMAL HIGH (ref 70–99)

## 2013-02-09 LAB — MRSA PCR SCREENING: MRSA by PCR: NEGATIVE

## 2013-02-09 MED ORDER — HEPARIN SODIUM (PORCINE) 5000 UNIT/ML IJ SOLN
5000.0000 [IU] | Freq: Three times a day (TID) | INTRAMUSCULAR | Status: AC
  Administered 2013-02-09 – 2013-02-10 (×3): 5000 [IU] via SUBCUTANEOUS
  Filled 2013-02-09 (×3): qty 1

## 2013-02-09 MED ORDER — INSULIN ASPART 100 UNIT/ML ~~LOC~~ SOLN
0.0000 [IU] | Freq: Three times a day (TID) | SUBCUTANEOUS | Status: DC
  Administered 2013-02-09 – 2013-02-10 (×4): 4 [IU] via SUBCUTANEOUS
  Administered 2013-02-10: 7 [IU] via SUBCUTANEOUS
  Administered 2013-02-11: 4 [IU] via SUBCUTANEOUS
  Administered 2013-02-11: 7 [IU] via SUBCUTANEOUS
  Administered 2013-02-11: 3 [IU] via SUBCUTANEOUS
  Administered 2013-02-12 (×2): 4 [IU] via SUBCUTANEOUS
  Administered 2013-02-13: 7 [IU] via SUBCUTANEOUS
  Administered 2013-02-13 (×2): 4 [IU] via SUBCUTANEOUS
  Administered 2013-02-14: 7 [IU] via SUBCUTANEOUS
  Administered 2013-02-14: 4 [IU] via SUBCUTANEOUS
  Administered 2013-02-14 – 2013-02-15 (×2): 7 [IU] via SUBCUTANEOUS
  Administered 2013-02-15: 4 [IU] via SUBCUTANEOUS
  Administered 2013-02-15 – 2013-02-17 (×2): 7 [IU] via SUBCUTANEOUS
  Administered 2013-02-17: 4 [IU] via SUBCUTANEOUS
  Administered 2013-02-17: 7 [IU] via SUBCUTANEOUS
  Administered 2013-02-18 (×2): 3 [IU] via SUBCUTANEOUS
  Administered 2013-02-19 – 2013-02-20 (×5): 4 [IU] via SUBCUTANEOUS

## 2013-02-09 MED ORDER — HEPARIN SODIUM (PORCINE) 5000 UNIT/ML IJ SOLN: 5000.0000 [IU] | Freq: Three times a day (TID) | INTRAMUSCULAR | Status: DC

## 2013-02-09 MED ORDER — SODIUM CHLORIDE 0.9 % IJ SOLN
10.0000 mL | INTRAMUSCULAR | Status: DC | PRN
  Administered 2013-02-11 – 2013-02-19 (×8): 10 mL

## 2013-02-09 MED ORDER — SENNOSIDES-DOCUSATE SODIUM 8.6-50 MG PO TABS
1.0000 | ORAL_TABLET | Freq: Two times a day (BID) | ORAL | Status: DC
  Administered 2013-02-09 – 2013-02-20 (×10): 1 via ORAL
  Filled 2013-02-09 (×11): qty 1

## 2013-02-09 NOTE — Progress Notes (Signed)
More alert, co-operative,able to express needs and understanding of what's happening with her

## 2013-02-09 NOTE — Progress Notes (Signed)
1 Day Post-Op  Subjective: Pt doing okay, c/o pain in buttock.  Pt wants to know what caused her to get this large infection.  Pt states she hasn't been up ambulating yet.  She wants to eat something.  Objective: Vital signs in last 24 hours: Temp:  [98.2 F (36.8 C)-98.7 F (37.1 C)] 98.5 F (36.9 C) (05/05 0605) Pulse Rate:  [98-105] 105 (05/05 0605) Resp:  [15-25] 19 (05/05 0605) BP: (139-197)/(67-104) 149/67 mmHg (05/05 0605) SpO2:  [95 %-100 %] 96 % (05/05 0605) Weight:  [325 lb 13.4 oz (147.8 kg)] 325 lb 13.4 oz (147.8 kg) (05/05 0605) Last BM Date: 02/02/13  Intake/Output from previous day: 05/04 0701 - 05/05 0700 In: 3242 [P.O.:720; I.V.:2522] Out: 2480 [Urine:2450; Blood:30] Intake/Output this shift:    PE: Gen:  Alert, NAD, pleasant Buttock:  Packing in place, will get better exam at dressing change   Lab Results:   Recent Labs  02/08/13 0645 02/09/13 0500  WBC 20.2* 19.2*  HGB 9.0* 8.0*  HCT 26.8* 25.1*  PLT 300 294   BMET  Recent Labs  02/08/13 0645 02/09/13 0500  NA 138 138  K 4.1 4.2  CL 107 108  CO2 23 22  GLUCOSE 161* 234*  BUN 31* 21  CREATININE 1.20* 1.01  CALCIUM 9.3 8.8   PT/INR No results found for this basename: LABPROT, INR,  in the last 72 hours CMP     Component Value Date/Time   NA 138 02/09/2013 0500   K 4.2 02/09/2013 0500   CL 108 02/09/2013 0500   CO2 22 02/09/2013 0500   GLUCOSE 234* 02/09/2013 0500   BUN 21 02/09/2013 0500   CREATININE 1.01 02/09/2013 0500   CALCIUM 8.8 02/09/2013 0500   PROT 5.8* 02/03/2013 0515   ALBUMIN 1.5* 02/09/2013 0500   AST 36 02/03/2013 0515   ALT 37* 02/03/2013 0515   ALKPHOS 122* 02/03/2013 0515   BILITOT 1.0 02/03/2013 0515   GFRNONAA 58* 02/09/2013 0500   GFRAA 68* 02/09/2013 0500   Lipase     Component Value Date/Time   LIPASE 17 02/02/2013 0850       Studies/Results: No results found.  Anti-infectives: Anti-infectives   Start     Dose/Rate Route Frequency Ordered Stop   02/03/13 1500   linezolid (ZYVOX) IVPB 600 mg     600 mg 300 mL/hr over 60 Minutes Intravenous Every 12 hours 02/03/13 1353     02/03/13 1400  vancomycin (VANCOCIN) 1,750 mg in sodium chloride 0.9 % 500 mL IVPB  Status:  Discontinued     1,750 mg 250 mL/hr over 120 Minutes Intravenous Every 24 hours 02/02/13 1356 02/03/13 1443   02/02/13 1730  piperacillin-tazobactam (ZOSYN) IVPB 3.375 g     3.375 g 12.5 mL/hr over 240 Minutes Intravenous Every 8 hours 02/02/13 1646     02/02/13 1400  vancomycin (VANCOCIN) 2,000 mg in sodium chloride 0.9 % 500 mL IVPB     2,000 mg 250 mL/hr over 120 Minutes Intravenous  Once 02/02/13 1355 02/02/13 1701       Assessment/Plan Perirectal and gluteal necrotizing soft tissue infection & abscess POD #1-4 s/p multiple I&D abscess 1.  Cultures growing microaerophilic strep, Return to OR tomorrow for re-debridement/dressing change (tues) 2.  IVF, pain control, IV antibiotics okay to stop linezolid 3.  Mobilize and IS 4.  SCD's, start heparin, hold for OR tomorrow 5.  Diet-okay to advance to reg diet and NPO after midnight  Leukocytosis - improved some to  19.2    LOS: 7 days    [name] 02/09/2013, 8:37 AM Pager: [phone]

## 2013-02-09 NOTE — Progress Notes (Signed)
[facility] PROGRESS NOTE  [name] [name] [id number] DOB: 11-26-1950 DOA: 02/02/2013 PCP: [name], MD  Assessment/Plan: 62 y.o. female with PMH of DM, Obesity, HTN, CKD 3, who presented with c/o diarrhea and a gluteal boil. She c/o diarrhea for ~5 days, non bloodly and non bilious, associated with lower abdominal cramps, with h/o recent antibiotic use 2-3 weeks prior for UTI. In addition she noted a boil of her L buttock /perineal region for 1 week which had progressively increased in size and was very painful. She denied any drainage from it. She denied any fevers, but did report chills. In the ER she was noted to be tachycardic with HR 130s, BP was low at 73/49 (improved with IVF to low 100s), WBC of 23K and electrolyte abormalities.  Since admission the patient has been treated with IV fluid resuscitation and empiric antibiotics. General surgery was consulted and the patient has been taken to the operating room twice thus far for I and D of her gluteal abscess.  The patient stabilized and was able to be transferred to the surgical floor on 02/05/2013.    Assessment/Plan:  Sepsis due to Cellulitis - perirectal and gluteal necrotizing abscess  - Sepsis resolved. - Vanc changed to Zyvox due to ARF  - s/p I&D in OR per Gen Surg 4/30,  5/2 , 5/4. - cont broad coverage. linezolid stopped 5/5. -Culture grew microaerophilic strep.  -Day 7 antibiotics. Continue with Zosyn.  -WBC trending down, today at 19 K. -Plan for more I and D by surgery 5-6.   DM  Patient has not been eating well, . 70/30 discontinue due to concern for hypoglycemia. Continue with lantus 10 units and SSI.   ARF on CKD stage III  - likely combo of prerenal and ATN (contrast-induced nephropathy)  - appreciate Nephro consult  - renal fxn improving, cr decrease to 1.01Cr peak to 2.7 -Continue with IV fluids.   Abnormal fluid and calcium collection in pelvis (noted via CT scan)  MRI 5-2: Interval resolution  of intra pelvic fluid collection Gout : Continue with Allopurinol.  Hypertension: Continue with Norvasc started on 5-4.  Constipation: Continue with Miralax. Add docusate. Check KUB.  DVT Prophylaxis: Heparin.   Problems Resolved.   Morbid obesity  Diarrhea: Resolved.  Hyponatremia  - likely related to her acute renal failure as well as volume depletion  -Resolved.  Hypokalemia  - Resolved   Code Status: Full  Family Communication: Care discussed with husband who was at bedside.  Disposition Plan: to be determine.    Consultants: Nephro  Gen Surgery   Procedures: 4/30 - I&D L breast and L gluteal abscesses  5/2 - repeat I&D left gluteal abscess   Antibiotics: Zosyn- 4/29 >>  Zyvox- 4/29 >>5/5.   HPI/Subjective: Patient calm. She is still confuse. She denies BM. She relates feels abdomen tight.    Objective: Filed Vitals:   02/08/13 1323 02/08/13 2220 02/09/13 0605 02/09/13 1423  BP: 160/77 139/72 149/67 154/63  Pulse: 102 101 105 104  Temp: 98.6 F (37 C) 98.2 F (36.8 C) 98.5 F (36.9 C) 99.5 F (37.5 C)  TempSrc: Oral Oral  Oral  Resp: 16 19 19 18   Height:      Weight:   147.8 kg (325 lb 13.4 oz)   SpO2: 100% 95% 96% 92%    Intake/Output Summary (Last 24 hours) at 02/09/13 1434 Last data filed at 02/09/13 0610  Gross per 24 hour  Intake   1942 ml  Output  1700 ml  Net    242 ml   Filed Weights   02/06/13 0512 02/08/13 0605 02/09/13 0605  Weight: 137.8 kg (303 lb 12.7 oz) 150.4 kg (331 lb 9.2 oz) 147.8 kg (325 lb 13.4 oz)    Exam:   General: Patient crying, distress because of pain.   Cardiovascular: S 1, S 2 RRR  Respiratory: CTA  Abdomen: BS present, soft, NT.   Musculoskeletal: left Buttock with dressing.   Data Reviewed: Basic Metabolic Panel:  Recent Labs Lab 02/02/13 1447  02/04/13 0358 02/05/13 0415 02/06/13 0450 02/07/13 0509 02/08/13 0645 02/09/13 0500  NA  --   < > 130* 130* 131* 135 138 138  K  --   < > 4.0  3.8 3.9 4.0 4.1 4.2  CL  --   < > 98 99 100 104 107 108  CO2  --   < > 19 21 19 20 23 22   GLUCOSE  --   < > 242* 191* 125* 119* 161* 234*  BUN  --   < > 48* 53* 57* 45* 31* 21  CREATININE  --   < > 2.53* 2.79* 2.65* 1.82* 1.20* 1.01  CALCIUM  --   < > 9.3 9.1 9.1 9.1 9.3 8.8  MG 1.5  --   --   --   --   --   --   --   PHOS 2.9  --  4.1  --   --  4.0 3.1 2.8  < > = values in this interval not displayed. Liver Function Tests:  Recent Labs Lab 02/03/13 0515 02/04/13 0358 02/07/13 0509 02/08/13 0645 02/09/13 0500  AST 36  --   --   --   --   ALT 37*  --   --   --   --   ALKPHOS 122*  --   --   --   --   BILITOT 1.0  --   --   --   --   PROT 5.8*  --   --   --   --   ALBUMIN 1.9* 1.7* 1.5* 1.6* 1.5*   No results found for this basename: LIPASE, AMYLASE,  in the last 168 hours No results found for this basename: AMMONIA,  in the last 168 hours CBC:  Recent Labs Lab 02/05/13 0415 02/06/13 0450 02/07/13 0509 02/08/13 0645 02/09/13 0500  WBC 23.4* 26.3* 22.3* 20.2* 19.2*  HGB 9.2* 9.2* 8.9* 9.0* 8.0*  HCT 27.4* 27.7* 27.1* 26.8* 25.1*  MCV 77.6* 78.2 78.1 77.2* 79.2  PLT 249 273 268 300 294   Cardiac Enzymes:  Recent Labs Lab 02/03/13 1632  CKTOTAL 158   BNP (last 3 results) No results found for this basename: PROBNP,  in the last 8760 hours CBG:  Recent Labs Lab 02/08/13 1304 02/08/13 1658 02/08/13 2216 02/09/13 0739 02/09/13 1237  GLUCAP 155* 194* 158* 214* 185*    Recent Results (from the past 240 hour(s))  CULTURE, BLOOD (ROUTINE X 2)     Status: None   Collection Time    02/02/13 10:27 AM      Result Value Range Status   Specimen Description BLOOD FOREARM RIGHT   Final   Special Requests BOTTLES DRAWN AEROBIC ONLY 5CC   Final   Culture  Setup Time 02/02/2013 15:05   Final   Culture NO GROWTH 5 DAYS   Final   Report Status 02/08/2013 FINAL   Final  CULTURE, BLOOD (ROUTINE X 2)  Status: None   Collection Time    02/02/13 10:35 AM      Result  Value Range Status   Specimen Description BLOOD HAND RIGHT   Final   Special Requests BOTTLES DRAWN AEROBIC AND ANAEROBIC 10CC   Final   Culture  Setup Time 02/02/2013 15:05   Final   Culture NO GROWTH 5 DAYS   Final   Report Status 02/08/2013 FINAL   Final  URINE CULTURE     Status: None   Collection Time    02/02/13 12:23 PM      Result Value Range Status   Specimen Description URINE, CATHETERIZED   Final   Special Requests ADDED [id number]   Final   Culture  Setup Time 02/02/2013 22:37   Final   Colony Count NO GROWTH   Final   Culture NO GROWTH   Final   Report Status 02/03/2013 FINAL   Final  URINE CULTURE     Status: None   Collection Time    02/03/13  6:08 PM      Result Value Range Status   Specimen Description URINE, CATHETERIZED   Final   Special Requests NONE   Final   Culture  Setup Time 02/03/2013 20:04   Final   Colony Count NO GROWTH   Final   Culture NO GROWTH   Final   Report Status 02/05/2013 FINAL   Final  CULTURE, ROUTINE-ABSCESS     Status: None   Collection Time    02/04/13 12:11 PM      Result Value Range Status   Specimen Description ABSCESS   Final   Special Requests LEFT GLUTEAL PT ON ZOSYN   Final   Gram Stain     Final   Value: FEW WBC PRESENT,BOTH PMN AND MONONUCLEAR     NO SQUAMOUS EPITHELIAL CELLS SEEN     MODERATE GRAM POSITIVE COCCI     IN PAIRS   Culture     Final   Value: FEW MICROAEROPHILIC STREPTOCOCCI     Note: Standardized susceptibility testing for this organism is not available.   Report Status 02/07/2013 FINAL   Final  ANAEROBIC CULTURE     Status: None   Collection Time    02/04/13 12:11 PM      Result Value Range Status   Specimen Description ABSCESS   Final   Special Requests LEFT GLUTEAL PT ON ZOSYN   Final   Gram Stain     Final   Value: FEW WBC PRESENT,BOTH PMN AND MONONUCLEAR     NO SQUAMOUS EPITHELIAL CELLS SEEN     MODERATE GRAM POSITIVE COCCI     IN PAIRS   Culture NO ANAEROBES ISOLATED   Final   Report Status  02/09/2013 FINAL   Final  MRSA PCR SCREENING     Status: None   Collection Time    02/08/13 11:15 PM      Result Value Range Status   MRSA by PCR NEGATIVE  NEGATIVE Final   Comment:            The GeneXpert MRSA Assay (FDA     approved for NASAL specimens     only), is one component of a     comprehensive MRSA colonization     surveillance program. It is not     intended to diagnose MRSA     infection nor to guide or     monitor treatment for     MRSA infections.  Studies: No results found.  Scheduled Meds: . allopurinol  100 mg Oral Daily  . amLODipine  5 mg Oral Daily  . cilostazol  100 mg Oral BID  . darifenacin  7.5 mg Oral Daily  . feeding supplement  237 mL Oral Q24H  . feeding supplement  30 mL Oral TID WC  . heparin subcutaneous  5,000 Units Subcutaneous Q8H  . insulin aspart  0-20 Units Subcutaneous TID WC  . insulin glargine  10 Units Subcutaneous QHS  . levothyroxine  150 mcg Oral QAC breakfast  . multivitamin with minerals  1 tablet Oral Daily  . pantoprazole  40 mg Oral Daily  . piperacillin-tazobactam (ZOSYN)  IV  3.375 g Intravenous Q8H  . polyethylene glycol  17 g Oral BID  . senna-docusate  1 tablet Oral BID   Continuous Infusions: . sodium chloride 100 mL/hr at 02/09/13 0218    Principal Problem:   Sepsis Active Problems:   DM (diabetes mellitus)   CKD (chronic kidney disease), stage III   Cellulitis   Morbid obesity   ARF (acute renal failure)   Diarrhea   Hyponatremia   Hypokalemia    Time spent: 25 minutes.     Bevin Das  [facility] Pager [phone]. If 7PM-7AM, please contact night-coverage at [email or web address], password [facility] 02/09/2013, 2:34 PM  LOS: 7 days

## 2013-02-09 NOTE — Progress Notes (Signed)
PT Cancellation Note  Patient Details Name: [name] [name] MRN: [id number] DOB: 10/21/1950   Cancelled Treatment:    Reason Eval Not Completed: Patient at procedure or test/unavailable (undergoing PICC line placement)   Santosha Jividen 02/09/2013, 11:34 AM  02/09/2013 Barry Brunner, PT Pager: [phone]

## 2013-02-09 NOTE — Progress Notes (Signed)
Return to OR tomorrow for dressing change/ washout.  [name]. Georgette Dover, MD, [facility] Surgery  02/09/2013 2:59 PM

## 2013-02-09 NOTE — Progress Notes (Signed)
PT Cancellation Note  Patient Details Name: [name] [name] MRN: [id number] DOB: 30-Apr-1951   Cancelled Treatment:    Reason Eval/Treat Not Completed: Patient at procedure or test/unavailable-- gone to xray per RN.   [name] 02/09/2013, 3:59 PM Pager [phone]

## 2013-02-09 NOTE — Progress Notes (Signed)
Peripherally Inserted Central Catheter/Midline Placement  The IV Nurse has discussed with the patient and/or persons authorized to consent for the patient, the purpose of this procedure and the potential benefits and risks involved with this procedure.  The benefits include less needle sticks, lab draws from the catheter and patient may be discharged home with the catheter.  Risks include, but not limited to, infection, bleeding, blood clot (thrombus formation), and puncture of an artery; nerve damage and irregular heat beat.  Alternatives to this procedure were also discussed. Husband present when reviewed  PICC/Midline Placement Documentation        Sheralyn Boatman 02/09/2013, 12:19 PM

## 2013-02-10 ENCOUNTER — Encounter (HOSPITAL_COMMUNITY): Payer: Self-pay | Admitting: Certified Registered"

## 2013-02-10 ENCOUNTER — Inpatient Hospital Stay (HOSPITAL_COMMUNITY): Payer: PRIVATE HEALTH INSURANCE | Admitting: Certified Registered"

## 2013-02-10 ENCOUNTER — Encounter (HOSPITAL_COMMUNITY)
Admission: EM | Disposition: A | Discharge status: Skilled Nursing Facility | Payer: Self-pay | Source: Home / Self Care | Attending: Internal Medicine

## 2013-02-10 HISTORY — PX: INCISION AND DRAINAGE PERIRECTAL ABSCESS: SHX1804

## 2013-02-10 LAB — CBC
HCT: 25.1 % — ABNORMAL LOW (ref 36.0–46.0)
Hemoglobin: 8.3 g/dL — ABNORMAL LOW (ref 12.0–15.0)
MCH: 25.9 pg — ABNORMAL LOW (ref 26.0–34.0)
MCHC: 33.1 g/dL (ref 30.0–36.0)
MCV: 78.2 fL (ref 78.0–100.0)
Platelets: 327 10*3/uL (ref 150–400)
RBC: 3.21 MIL/uL — ABNORMAL LOW (ref 3.87–5.11)
RDW: 16.4 % — ABNORMAL HIGH (ref 11.5–15.5)
WBC: 19.2 10*3/uL — ABNORMAL HIGH (ref 4.0–10.5)

## 2013-02-10 LAB — RENAL FUNCTION PANEL
Albumin: 1.6 g/dL — ABNORMAL LOW (ref 3.5–5.2)
BUN: 14 mg/dL (ref 6–23)
CO2: 25 mEq/L (ref 19–32)
Calcium: 8.7 mg/dL (ref 8.4–10.5)
Chloride: 105 mEq/L (ref 96–112)
Creatinine, Ser: 0.94 mg/dL (ref 0.50–1.10)
GFR calc Af Amer: 74 mL/min — ABNORMAL LOW (ref >90)
GFR calc non Af Amer: 64 mL/min — ABNORMAL LOW (ref >90)
Glucose, Bld: 179 mg/dL — ABNORMAL HIGH (ref 70–99)
Phosphorus: 2.7 mg/dL (ref 2.3–4.6)
Potassium: 3.9 mEq/L (ref 3.5–5.1)
Sodium: 138 mEq/L (ref 135–145)

## 2013-02-10 LAB — GLUCOSE, CAPILLARY
Glucose-Capillary: 179 mg/dL — ABNORMAL HIGH (ref 70–99)
Glucose-Capillary: 153 mg/dL — ABNORMAL HIGH (ref 70–99)
Glucose-Capillary: 175 mg/dL — ABNORMAL HIGH (ref 70–99)
Glucose-Capillary: 227 mg/dL — ABNORMAL HIGH (ref 70–99)
Glucose-Capillary: 272 mg/dL — ABNORMAL HIGH (ref 70–99)

## 2013-02-10 SURGERY — INCISION AND DRAINAGE, ABSCESS, PERIRECTAL
Anesthesia: General | Site: Buttocks | Laterality: Left | Wound class: Dirty or Infected

## 2013-02-10 MED ORDER — SODIUM CHLORIDE 0.9 % IR SOLN
Status: DC | PRN
  Administered 2013-02-10: 3000 mL

## 2013-02-10 MED ORDER — PROPOFOL 10 MG/ML IV BOLUS
INTRAVENOUS | Status: DC | PRN
  Administered 2013-02-10: 150 mg via INTRAVENOUS

## 2013-02-10 MED ORDER — PROMETHAZINE HCL 25 MG/ML IJ SOLN: 6.2500 mg | INTRAMUSCULAR | Status: DC | PRN

## 2013-02-10 MED ORDER — 0.9 % SODIUM CHLORIDE (POUR BTL) OPTIME
TOPICAL | Status: DC | PRN
  Administered 2013-02-10: 1000 mL

## 2013-02-10 MED ORDER — OXYCODONE HCL 5 MG/5ML PO SOLN: 5.0000 mg | Freq: Once | ORAL | Status: DC | PRN

## 2013-02-10 MED ORDER — LIDOCAINE HCL (CARDIAC) 20 MG/ML IV SOLN
INTRAVENOUS | Status: DC | PRN
  Administered 2013-02-10: 60 mg via INTRAVENOUS

## 2013-02-10 MED ORDER — FENTANYL CITRATE 0.05 MG/ML IJ SOLN
INTRAMUSCULAR | Status: DC | PRN
  Administered 2013-02-10 (×2): 50 ug via INTRAVENOUS
  Administered 2013-02-10: 150 ug via INTRAVENOUS

## 2013-02-10 MED ORDER — ONDANSETRON HCL 4 MG/2ML IJ SOLN
INTRAMUSCULAR | Status: DC | PRN
  Administered 2013-02-10: 4 mg via INTRAVENOUS

## 2013-02-10 MED ORDER — HYDROMORPHONE HCL PF 1 MG/ML IJ SOLN
0.2500 mg | INTRAMUSCULAR | Status: DC | PRN
  Administered 2013-02-10 (×4): 0.5 mg via INTRAVENOUS

## 2013-02-10 MED ORDER — SUCCINYLCHOLINE CHLORIDE 20 MG/ML IJ SOLN
INTRAMUSCULAR | Status: DC | PRN
  Administered 2013-02-10: 100 mg via INTRAVENOUS

## 2013-02-10 MED ORDER — MEPERIDINE HCL 25 MG/ML IJ SOLN: 6.2500 mg | INTRAMUSCULAR | Status: DC | PRN

## 2013-02-10 MED ORDER — SORBITOL 70 % SOLN
30.0000 mL | Freq: Once | Status: AC
  Administered 2013-02-10: 30 mL via ORAL
  Filled 2013-02-10: qty 30

## 2013-02-10 MED ORDER — OXYCODONE HCL 5 MG PO TABS: 5.0000 mg | ORAL_TABLET | Freq: Once | ORAL | Status: DC | PRN

## 2013-02-10 MED ORDER — HYDROMORPHONE HCL PF 1 MG/ML IJ SOLN
INTRAMUSCULAR | Status: AC
  Filled 2013-02-10: qty 1

## 2013-02-10 MED ORDER — MIDAZOLAM HCL 2 MG/2ML IJ SOLN: 0.5000 mg | Freq: Once | INTRAMUSCULAR | Status: DC | PRN

## 2013-02-10 MED ORDER — OXYCODONE HCL 5 MG PO TABS
ORAL_TABLET | ORAL | Status: AC
  Administered 2013-02-10: 5 mg
  Filled 2013-02-10: qty 1

## 2013-02-10 MED ORDER — LACTATED RINGERS IV SOLN
INTRAVENOUS | Status: DC
  Administered 2013-02-10: 09:00:00 via INTRAVENOUS

## 2013-02-10 SURGICAL SUPPLY — 39 items
BANDAGE GAUZE ELAST BULKY 4 IN (GAUZE/BANDAGES/DRESSINGS) ×4 IMPLANT
BLADE SURG 10 STRL SS (BLADE) ×2 IMPLANT
BLADE SURG 15 STRL LF DISP TIS (BLADE) ×1 IMPLANT
BLADE SURG 15 STRL SS (BLADE) ×1
CLOTH BEACON ORANGE TIMEOUT ST (SAFETY) ×2 IMPLANT
COVER MAYO STAND STRL (DRAPES) ×2 IMPLANT
COVER SURGICAL LIGHT HANDLE (MISCELLANEOUS) ×2 IMPLANT
DRAIN PENROSE 1X12 LTX STRL (DRAIN) ×4 IMPLANT
DRAPE LAPAROSCOPIC ABDOMINAL (DRAPES) ×2 IMPLANT
DRAPE UTILITY 15X26 W/TAPE STR (DRAPE) ×4 IMPLANT
DRSG PAD ABDOMINAL 8X10 ST (GAUZE/BANDAGES/DRESSINGS) ×4 IMPLANT
ELECT CAUTERY BLADE 6.4 (BLADE) ×2 IMPLANT
ELECT REM PT RETURN 9FT ADLT (ELECTROSURGICAL) ×2
ELECTRODE REM PT RTRN 9FT ADLT (ELECTROSURGICAL) ×1 IMPLANT
GLOVE BIO SURGEON STRL SZ7 (GLOVE) ×2 IMPLANT
GLOVE BIOGEL PI IND STRL 7.0 (GLOVE) ×2 IMPLANT
GLOVE BIOGEL PI IND STRL 7.5 (GLOVE) ×1 IMPLANT
GLOVE BIOGEL PI INDICATOR 7.0 (GLOVE) ×2
GLOVE BIOGEL PI INDICATOR 7.5 (GLOVE) ×1
GLOVE SURG SS PI 7.0 STRL IVOR (GLOVE) ×2 IMPLANT
GOWN STRL NON-REIN LRG LVL3 (GOWN DISPOSABLE) ×4 IMPLANT
HANDPIECE INTERPULSE COAX TIP (DISPOSABLE) ×1
KIT BASIN OR (CUSTOM PROCEDURE TRAY) ×2 IMPLANT
KIT ROOM TURNOVER OR (KITS) ×2 IMPLANT
NS IRRIG 1000ML POUR BTL (IV SOLUTION) ×2 IMPLANT
PACK LITHOTOMY IV (CUSTOM PROCEDURE TRAY) ×2 IMPLANT
PAD ARMBOARD 7.5X6 YLW CONV (MISCELLANEOUS) ×4 IMPLANT
PENCIL BUTTON HOLSTER BLD 10FT (ELECTRODE) ×2 IMPLANT
SET HNDPC FAN SPRY TIP SCT (DISPOSABLE) ×1 IMPLANT
SPONGE GAUZE 4X4 12PLY (GAUZE/BANDAGES/DRESSINGS) IMPLANT
SPONGE LAP 18X18 X RAY DECT (DISPOSABLE) ×2 IMPLANT
SURGILUBE 2OZ TUBE FLIPTOP (MISCELLANEOUS) IMPLANT
SUT ETHILON 2 0 FS 18 (SUTURE) ×4 IMPLANT
SYR BULB 3OZ (MISCELLANEOUS) ×2 IMPLANT
TOWEL OR 17X24 6PK STRL BLUE (TOWEL DISPOSABLE) ×2 IMPLANT
TOWEL OR 17X26 10 PK STRL BLUE (TOWEL DISPOSABLE) ×2 IMPLANT
TUBE CONNECTING 12X1/4 (SUCTIONS) ×2 IMPLANT
WATER STERILE IRR 1000ML POUR (IV SOLUTION) IMPLANT
YANKAUER SUCT BULB TIP NO VENT (SUCTIONS) ×2 IMPLANT

## 2013-02-10 NOTE — Care Management Note (Signed)
  Page 2 of 2   02/18/2013     1:14:20 PM   CARE MANAGEMENT NOTE 02/18/2013  Patient:  [name] [name],[name] [name]   Account Number:  [id number]  Date Initiated:  02/03/2013  Documentation initiated by:  Marvetta Gibbons  Subjective/Objective Assessment:   Pt admitted with abd pain- sepsis     Action/Plan:   PTA pt lived at home with family- NCM to follow pt progression for d/c needs   Anticipated DC Date:     Anticipated DC Plan:  [place]  In-house referral  Clinical Social Worker      DC Planning Services  CM consult      Choice offered to / List presented to:             Status of service:  In process, will continue to follow Medicare Important Message given?   (If response is "NO", the following Medicare IM given date fields will be blank) Date Medicare IM given:   Date Additional Medicare IM given:    Discharge Disposition:    Per UR Regulation:  Reviewed for med. necessity/level of care/duration of stay  If discussed at [place] of Stay Meetings, dates discussed:    Comments:  02-18-13 Discussed home health with patient again . Patient still trying to find someone at home to assist her with dressing changes for when home health is not there. Patient open to discussing SNF short term . Social worker will see.  Magdalen Spatz RN BSN   02-10-13 Received referral for Bariatric bed needed wt= 150.4 kg. Dx is necrotizing gluteal wound, discussed with bedside on 02-09-13 who stated order was for Bariatric bed for use inpatient , bedside was going to order same.   Spoke with patient today , patient currently not on a Bariatric bed , stated she does not want one for home . Joaquim Lai is bedside nurse today and will order same for inpatient use.   Patient wants to go home at discharge.   She is currently married but does not live with her husband , however, patient states her husband helps her " as much as he can". Patinet lives with her sister , prior to this hospitalization she  was in progress of "finding her own place" , states she does not want to live with her sister .  Patient does have an nurses aide through [facility] 5 days a week for 2 hours at a time.  PT to eval , await recommendations. If plan is to discharge home with Home Health patient prefers [facility] ( has had them in the past).  P[place]et already has walker , cane bedside commode and shower chair at home.   Will continued to follow   Magdalen Spatz RN BSN [phone]   02/05/13- [place] RN, BSN [phone] Pt s/p Procedure(s): INCISION AND DRAINAGE LEFT BUTTOCK ABSCESS; INCISION AND DRAINAGE LEFT BREAST ABSCESS (Left) 02/04/13 Will return to OR for dressing change tomorrow and see if any further debridement needed NCM to follow pt for progression and d/c needs.

## 2013-02-10 NOTE — Progress Notes (Signed)
Cancel OT visit, pt off floor for surgical procedure. Will re attempt eval tomorrow  Mosetta Putt, OT 02/10/13

## 2013-02-10 NOTE — Op Note (Signed)
Preop diagnosis: necrotizing soft tissue infection of the left perirectal, perineal, and gluteal regions Postop diagnosis: Same Procedure performed:  Irrigation and debridement of left perirectal, peroneal, and gluteal regions with placement of Penrose drains Surgeon:Aarin Sparkman K. Anesthesia: Gen. Endotracheal Indications:this is a 62 year old female who has had multiple debridements of a large left buttock abscess with necrosis. This extends up into her left labia and down around the left pararectal regions. The wound is too large and far too tender for bedside dressing changes at this time.  Description of procedure: The patient brought to the operating room and placed in a supine position on the operating room table. After an adequate level of general anesthesia was obtained her legs were placed in yellowfin stirrups in lithotomy position. The old dressing was removed. Was prepped with Betadine and draped in sterile fashion. A timeout was taken. We inspected the wound. There was a couple of pockets of purulent pus posteriorly on the left. These were easily drained. There is some undermining of about 6 cm on the scan heading out and the buttock. Anteriorly the skin is undermined on the labia. Minimal purulence was noted in this area. We thoroughly irrigated the wound with pulse lavage using 3 L of normal saline. I then placed 1 inch Penrose drains in the posterior location heading down into the undermined area. This was sutured in place with 2-0 nylon sutures. We also placed another 1 inch Penrose drain anteriorly on the labia. The wound was then packed with 2 rolls of Kerlix gauze soaked in saline. A dry dressing was applied. The patient was then extubated and brought to the recovery was stable condition. All sponge, initially, and needle counts are correct.  [name]. Georgette Dover, MD, [facility] Surgery  General/ Trauma Surgery  02/10/2013 10:30 AM

## 2013-02-10 NOTE — Evaluation (Signed)
Physical Therapy Evaluation Patient Details Name: [name] [name] MRN: [id number] DOB: [birth year] Today's Date: 02/10/2013 Time: [id number] PT Time Calculation (min): 53 min  PT Assessment / Plan / Recommendation Clinical Impression  pt s/p multiple I and D's for necrotizing abscess.  On eval, pt weak and lacking much tolerance for activity, but pushed herself nicely.  Pt can benefit from Pt to maximize function.    PT Assessment  Patient needs continued PT services    Follow Up Recommendations  Home health PT;SNF;Other (comment) (depending on pt's caregiver support and wound needs)    Does the patient have the potential to tolerate intense rehabilitation      Barriers to Discharge Decreased caregiver support      Equipment Recommendations  Rolling walker with 5" wheels;Other (comment) (TBD)    Recommendations for Other Services     Frequency Min 3X/week    Precautions / Restrictions Precautions Precautions: Fall Restrictions Weight Bearing Restrictions: No   Pertinent Vitals/Pain      Mobility  Bed Mobility Bed Mobility: Rolling Right;Right Sidelying to Sit;Sitting - Scoot to Edge of Bed;Sit to Sidelying Right Rolling Right: 6: Modified independent (Device/Increase time);With rail Right Sidelying to Sit: With rails;HOB flat;4: Min guard Sitting - Scoot to Edge of Bed: 5: Supervision Sit to Sidelying Right: 4: Min assist;With rail;HOB flat Details for Bed Mobility Assistance: min guard to min for safety Transfers Transfers: Sit to Stand;Stand to Sit Sit to Stand: 4: Min assist;With upper extremity assist;From bed Stand to Sit: 5: Supervision;With upper extremity assist;To bed Details for Transfer Assistance: vc's for hand placement Ambulation/Gait Ambulation/Gait Assistance: 4: Min guard;4: Min assist Ambulation Distance (Feet): 3 Feet (feet forward/back) Assistive device: Rolling walker Ambulation/Gait Assistance Details: effortful, but steady Gait Pattern: Step-to  pattern;Decreased step length - right;Decreased step length - left Stairs: No Wheelchair Mobility Wheelchair Mobility: No    Exercises     PT Diagnosis: Generalized weakness;Difficulty walking  PT Problem List: Decreased strength;Decreased activity tolerance;Decreased mobility;Cardiopulmonary status limiting activity;Obesity;Pain PT Treatment Interventions: DME instruction;Gait training;Functional mobility training;Therapeutic activities;Therapeutic exercise;Stair training;Patient/family education   PT Goals Acute Rehab PT Goals PT Goal Formulation: With patient Time For Goal Achievement: 02/24/13 Potential to Achieve Goals: Good Pt will go Supine/Side to Sit: with modified independence;with HOB 0 degrees PT Goal: Supine/Side to Sit - Progress: Goal set today Pt will go Sit to Stand: with modified independence PT Goal: Sit to Stand - Progress: Goal set today Pt will Transfer Bed to Chair/Chair to Bed: with modified independence PT Transfer Goal: Bed to Chair/Chair to Bed - Progress: Goal set today Pt will Ambulate: 16 - 50 feet;with modified independence;with least restrictive assistive device PT Goal: Ambulate - Progress: Goal set today Pt will Go Up / Down Stairs: 1-2 stairs;with supervision;with rail(s);Other (comment) (if pt to go directly home) PT Goal: Up/Down Stairs - Progress: Goal set today  Visit Information  Last PT Received On: 02/10/13 Assistance Needed: +1    Subjective Data  Subjective: I love you.... Patient Stated Goal: I want to be able to live by myself.Marland KitchenMarland KitchenMarland KitchenI've got to find an apartment I can afford   Prior [place] Lives With: Family;Other (Comment) (lives with sister, dynamics not good with her adult nephew.) Available Help at Discharge: Family (TBD) Type of Home: House Home Access: Stairs to enter [employer] of Steps: several Home Layout: Two level;Other (Comment) (bed and no bath 2nd level) Alternate Level Stairs-Number of  Steps: flight Alternate Level Stairs-Rails: Right;Left Bathroom Shower/Tub: Tub/shower unit;Curtain Bathroom  Toilet: Standard Home Adaptive Equipment: Bedside commode/3-in-1;Shower chair with back;Tub transfer bench;Straight cane (? walker) Prior Function Level of Independence: Independent with assistive device(s) Able to Take Stairs?: Yes Dominant Hand: Right    Cognition  Cognition Arousal/Alertness: Awake/alert Behavior During Therapy: WFL for tasks assessed/performed Overall Cognitive Status: Within Functional Limits for tasks assessed (still a little confused)    Extremity/Trunk Assessment Right Upper Extremity Assessment RUE ROM/Strength/Tone: Within functional levels Left Upper Extremity Assessment LUE ROM/Strength/Tone: Within functional levels Right Lower Extremity Assessment RLE ROM/Strength/Tone: WFL for tasks assessed Left Lower Extremity Assessment LLE ROM/Strength/Tone: WFL for tasks assessed (generally weak bil, but function and able to bear weight)   Balance Balance Balance Assessed: Yes Static Standing Balance Static Standing - Balance Support: Bilateral upper extremity supported Static Standing - Level of Assistance: 4: Min assist;5: Stand by assistance Static Standing - Comment/# of Minutes: gets tired and propson elbows; stood greater that 4 minutes while bed beign made.  End of Session PT - End of Session Activity Tolerance: Patient tolerated treatment well Patient left: in bed;with call bell/phone within reach Nurse Communication: Mobility status  GP     Gitty Osterlund, [name] 02/10/2013, 5:18 PM 02/10/2013  [name], [place] [phone]  (pager)

## 2013-02-10 NOTE — Anesthesia Preprocedure Evaluation (Addendum)
Anesthesia Evaluation  Patient identified by MRN, date of birth, ID band Patient awake    Reviewed: Allergy & Precautions, H&P , NPO status , Patient's Chart, lab work & pertinent test results, reviewed documented beta blocker date and time   History of Anesthesia Complications Negative for: history of anesthetic complications  Airway Mallampati: II TM Distance: >3 FB Neck ROM: Full    Dental  (+) Edentulous Upper and Dental Advisory Given   Pulmonary asthma (daily inhalers) , Current Smoker,  breath sounds clear to auscultation  Pulmonary exam normal       Cardiovascular hypertension, Pt. on medications and Pt. on home beta blockers + Peripheral Vascular Disease Rhythm:Regular Rate:Normal     Neuro/Psych negative neurological ROS     GI/Hepatic GERD-  Medicated and Controlled,Elevated LFTs    Endo/Other  diabetes (glu 179), Type 2, Insulin DependentMorbid obesity  Renal/GU Renal InsufficiencyRenal disease (recent acute renal failure, creat 0.94, K+ 3.9 today)     Musculoskeletal  (+) Arthritis -, Osteoarthritis,    Abdominal (+) + obese,   Peds  Hematology  (+) Blood dyscrasia (Hb 8.3), anemia ,   Anesthesia Other Findings   Reproductive/Obstetrics                          Anesthesia Physical Anesthesia Plan  ASA: III  Anesthesia Plan: General   Post-op Pain Management:    Induction: Intravenous  Airway Management Planned: Oral ETT  Additional Equipment:   Intra-op Plan:   Post-operative Plan: Extubation in OR  Informed Consent: I have reviewed the patients History and Physical, chart, labs and discussed the procedure including the risks, benefits and alternatives for the proposed anesthesia with the patient or authorized representative who has indicated his/her understanding and acceptance.   Dental advisory given  Plan Discussed with: Surgeon and CRNA  Anesthesia Plan  Comments: (Plan routine monitors, GETA)        Anesthesia Quick Evaluation

## 2013-02-10 NOTE — Preoperative (Signed)
Beta Blockers   Reason not to administer Beta Blockers:Not Applicable 

## 2013-02-10 NOTE — Anesthesia Postprocedure Evaluation (Signed)
  Anesthesia Post-op Note  Patient: [name] [name]  Procedure(s) Performed: Procedure(s): IRRIGATION AND DEBRIDEMENT OF BUTTOCK/PERINEAL ABSCESS (Left)  Patient Location: PACU  Anesthesia Type:General  Level of Consciousness: awake, alert , oriented and patient cooperative  Airway and Oxygen Therapy: Patient Spontanous Breathing and Patient connected to nasal cannula oxygen  Post-op Pain: mild  Post-op Assessment: Post-op Vital signs reviewed, Patient's Cardiovascular Status Stable, Respiratory Function Stable, Patent Airway, No signs of Nausea or vomiting and Pain level controlled  Post-op Vital Signs: Reviewed and stable  Complications: No apparent anesthesia complications

## 2013-02-10 NOTE — Anesthesia Procedure Notes (Signed)
Procedure Name: Intubation Date/Time: 02/10/2013 9:49 AM Performed by: Julian Reil Pre-anesthesia Checklist: Patient identified, Emergency Drugs available, Suction available and Patient being monitored Patient Re-evaluated:Patient Re-evaluated prior to inductionOxygen Delivery Method: Circle system utilized Preoxygenation: Pre-oxygenation with 100% oxygen Intubation Type: IV induction Ventilation: Mask ventilation without difficulty Laryngoscope Size: Mac and 3 Grade View: Grade I Tube type: Oral Tube size: 7.0 mm Number of attempts: 1 Airway Equipment and Method: Stylet Placement Confirmation: ETT inserted through vocal cords under direct vision,  positive ETCO2 and breath sounds checked- equal and bilateral Secured at: 22 cm Tube secured with: Tape Dental Injury: Teeth and Oropharynx as per pre-operative assessment  Comments: Easy mask airway. DL x 1, Grade I view. Epiglottis and cords edematous. Atraumatic intubation.

## 2013-02-10 NOTE — Transfer of Care (Signed)
Immediate Anesthesia Transfer of Care Note  Patient: [name] [name]  Procedure(s) Performed: Procedure(s): IRRIGATION AND DEBRIDEMENT OF BUTTOCK/PERINEAL ABSCESS (Left)  Patient Location: PACU  Anesthesia Type:General  Level of Consciousness: awake, alert , oriented and patient cooperative  Airway & Oxygen Therapy: Patient Spontanous Breathing and Patient connected to face mask oxygen  Post-op Assessment: Report given to PACU RN, Post -op Vital signs reviewed and stable and Patient moving all extremities  Post vital signs: Reviewed and stable  Complications: No apparent anesthesia complications

## 2013-02-10 NOTE — Progress Notes (Signed)
[facility] PROGRESS NOTE  [name] [name] [id number] DOB: 03-Aug-1951 DOA: 02/02/2013 PCP: [name], MD  Assessment/Plan: 62 y.o. female with PMH of DM, Obesity, HTN, CKD 3, who presented with c/o diarrhea and a gluteal boil. She c/o diarrhea for ~5 days, non bloodly and non bilious, associated with lower abdominal cramps, with h/o recent antibiotic use 2-3 weeks prior for UTI. In addition she noted a boil of her L buttock /perineal region for 1 week which had progressively increased in size and was very painful. She denied any drainage from it. She denied any fevers, but did report chills. In the ER she was noted to be tachycardic with HR 130s, BP was low at 73/49 (improved with IVF to low 100s), WBC of 23K and electrolyte abormalities.  Since admission the patient has been treated with IV fluid resuscitation and empiric antibiotics. General surgery was consulted and the patient has been taken to the operating room multiple times  for I and D of her gluteal abscess.  The patient stabilized and was able to be transferred to the surgical floor on 02/05/2013.    Assessment/Plan:  Sepsis due to Cellulitis - perirectal and gluteal necrotizing abscess  - Sepsis resolved. - Vanc changed to Zyvox due to ARF  - s/p I&D in OR per Gen Surg 4/30,  5/2 , 5/4, 5-6. - cont broad coverage. linezolid stopped 5/5. -Culture grew microaerophilic strep.  -Continue with Zosyn day 8.  -WBC trending down, today at 19 K.  DM  Patient has not been eating well, . 70/30 discontinue due to concern for hypoglycemia. Continue with lantus 10 units and SSI.   ARF on CKD stage III  - likely combo of prerenal and ATN (contrast-induced nephropathy)  - appreciate Nephro consult  - renal fxn improving, cr decrease to 1.01Cr peak to 2.7 -Continue with IV fluids.   Abnormal fluid and calcium collection in pelvis (noted via CT scan)  MRI 5-2: Interval resolution of intra pelvic fluid collection. Gout :  Continue with Allopurinol.  Hypertension: Continue with Norvasc started on 5-4.  Constipation: Continue with Miralax. Add docusate.KUB pattern suggestive of ileum. Patient tolerating meals. Will give one dose of sorbitol. Monitor clinical course. Marland Kitchen  DVT Prophylaxis: Heparin.   Problems Resolved.   Morbid obesity  Diarrhea: Resolved.  Hyponatremia  - likely related to her acute renal failure as well as volume depletion  -Resolved.  Hypokalemia  - Resolved   Code Status: Full  Family Communication: Care discussed with husband who was at bedside.  Disposition Plan: to be determine.    Consultants: Nephro  Gen Surgery   Procedures: 4/30 - I&D L breast and L gluteal abscesses  5/2 - repeat I&D left gluteal abscess   Antibiotics: Zosyn- 4/29 >>  Zyvox- 4/29 >>5/5.   HPI/Subjective: Patient calm. She is less confuse. She denies BM. She relates feels abdomen is less tight, no significant pain, she is passing gas. No nausea. She is feeling better today.    Objective: Filed Vitals:   02/10/13 1135 02/10/13 1145 02/10/13 1149 02/10/13 1212  BP: 166/97  170/91 128/73  Pulse:  105 107 114  Temp:   98.5 F (36.9 C) 98.4 F (36.9 C)  TempSrc:    Oral  Resp:  20 16 18   Height:      Weight:      SpO2:  97% 95% 100%    Intake/Output Summary (Last 24 hours) at 02/10/13 1558 Last data filed at 02/10/13 1152  Gross per 24  hour  Intake    720 ml  Output   1975 ml  Net  -1255 ml   Filed Weights   02/09/13 0605 02/09/13 1849 02/10/13 0613  Weight: 147.8 kg (325 lb 13.4 oz) 152.6 kg (336 lb 6.8 oz) 152.6 kg (336 lb 6.8 oz)    Exam:   General: No distress, calm.   Cardiovascular: S 1, S 2 RRR  Respiratory: CTA  Abdomen: BS present, soft, NT.   Musculoskeletal: left Buttock with dressing.   Data Reviewed: Basic Metabolic Panel:  Recent Labs Lab 02/04/13 0358  02/06/13 0450 02/07/13 0509 02/08/13 0645 02/09/13 0500 02/10/13 0550  NA 130*  < > 131* 135 138  138 138  K 4.0  < > 3.9 4.0 4.1 4.2 3.9  CL 98  < > 100 104 107 108 105  CO2 19  < > 19 20 23 22 25   GLUCOSE 242*  < > 125* 119* 161* 234* 179*  BUN 48*  < > 57* 45* 31* 21 14  CREATININE 2.53*  < > 2.65* 1.82* 1.20* 1.01 0.94  CALCIUM 9.3  < > 9.1 9.1 9.3 8.8 8.7  PHOS 4.1  --   --  4.0 3.1 2.8 2.7  < > = values in this interval not displayed. Liver Function Tests:  Recent Labs Lab 02/04/13 0358 02/07/13 0509 02/08/13 0645 02/09/13 0500 02/10/13 0550  ALBUMIN 1.7* 1.5* 1.6* 1.5* 1.6*   No results found for this basename: LIPASE, AMYLASE,  in the last 168 hours No results found for this basename: AMMONIA,  in the last 168 hours CBC:  Recent Labs Lab 02/06/13 0450 02/07/13 0509 02/08/13 0645 02/09/13 0500 02/10/13 0550  WBC 26.3* 22.3* 20.2* 19.2* 19.2*  HGB 9.2* 8.9* 9.0* 8.0* 8.3*  HCT 27.7* 27.1* 26.8* 25.1* 25.1*  MCV 78.2 78.1 77.2* 79.2 78.2  PLT 273 268 300 294 327   Cardiac Enzymes:  Recent Labs Lab 02/03/13 1632  CKTOTAL 158   BNP (last 3 results) No results found for this basename: PROBNP,  in the last 8760 hours CBG:  Recent Labs Lab 02/09/13 1712 02/09/13 2132 02/10/13 0747 02/10/13 1036 02/10/13 1208  GLUCAP 176* 187* 179* 153* 175*    Recent Results (from the past 240 hour(s))  CULTURE, BLOOD (ROUTINE X 2)     Status: None   Collection Time    02/02/13 10:27 AM      Result Value Range Status   Specimen Description BLOOD FOREARM RIGHT   Final   Special Requests BOTTLES DRAWN AEROBIC ONLY 5CC   Final   Culture  Setup Time 02/02/2013 15:05   Final   Culture NO GROWTH 5 DAYS   Final   Report Status 02/08/2013 FINAL   Final  CULTURE, BLOOD (ROUTINE X 2)     Status: None   Collection Time    02/02/13 10:35 AM      Result Value Range Status   Specimen Description BLOOD HAND RIGHT   Final   Special Requests BOTTLES DRAWN AEROBIC AND ANAEROBIC 10CC   Final   Culture  Setup Time 02/02/2013 15:05   Final   Culture NO GROWTH 5 DAYS   Final    Report Status 02/08/2013 FINAL   Final  URINE CULTURE     Status: None   Collection Time    02/02/13 12:23 PM      Result Value Range Status   Specimen Description URINE, CATHETERIZED   Final   Special Requests ADDED [id number]  Final   Culture  Setup Time 02/02/2013 22:37   Final   Colony Count NO GROWTH   Final   Culture NO GROWTH   Final   Report Status 02/03/2013 FINAL   Final  URINE CULTURE     Status: None   Collection Time    02/03/13  6:08 PM      Result Value Range Status   Specimen Description URINE, CATHETERIZED   Final   Special Requests NONE   Final   Culture  Setup Time 02/03/2013 20:04   Final   Colony Count NO GROWTH   Final   Culture NO GROWTH   Final   Report Status 02/05/2013 FINAL   Final  CULTURE, ROUTINE-ABSCESS     Status: None   Collection Time    02/04/13 12:11 PM      Result Value Range Status   Specimen Description ABSCESS   Final   Special Requests LEFT GLUTEAL PT ON ZOSYN   Final   Gram Stain     Final   Value: FEW WBC PRESENT,BOTH PMN AND MONONUCLEAR     NO SQUAMOUS EPITHELIAL CELLS SEEN     MODERATE GRAM POSITIVE COCCI     IN PAIRS   Culture     Final   Value: FEW MICROAEROPHILIC STREPTOCOCCI     Note: Standardized susceptibility testing for this organism is not available.   Report Status 02/07/2013 FINAL   Final  ANAEROBIC CULTURE     Status: None   Collection Time    02/04/13 12:11 PM      Result Value Range Status   Specimen Description ABSCESS   Final   Special Requests LEFT GLUTEAL PT ON ZOSYN   Final   Gram Stain     Final   Value: FEW WBC PRESENT,BOTH PMN AND MONONUCLEAR     NO SQUAMOUS EPITHELIAL CELLS SEEN     MODERATE GRAM POSITIVE COCCI     IN PAIRS   Culture NO ANAEROBES ISOLATED   Final   Report Status 02/09/2013 FINAL   Final  MRSA PCR SCREENING     Status: None   Collection Time    02/08/13 11:15 PM      Result Value Range Status   MRSA by PCR NEGATIVE  NEGATIVE Final   Comment:            The GeneXpert MRSA  Assay (FDA     approved for NASAL specimens     only), is one component of a     comprehensive MRSA colonization     surveillance program. It is not     intended to diagnose MRSA     infection nor to guide or     monitor treatment for     MRSA infections.     Studies: Dg Abd 2 Views  02/09/2013  *RADIOLOGY REPORT*  Clinical Data: Abdominal pain.  Constipation.  ABDOMEN - 2 VIEW  Comparison: CT abdomen and pelvis 02/02/2013.  Findings: No free intraperitoneal air is identified.  There is a large volume of stool in the ascending and transverse colon. Multiple gas filled, dilated loops of small bowel are identified. The patient appears to have a left inguinal hernia with a loop of colon within it.  IMPRESSION:  1.  Bowel gas pattern most suggestive of ileus. 2.  Likely left inguinal hernia.   Original Report Authenticated By: Orlean Patten, M.D.     Scheduled Meds: . allopurinol  100 mg Oral Daily  . amLODipine  5 mg Oral Daily  . cilostazol  100 mg Oral BID  . darifenacin  7.5 mg Oral Daily  . feeding supplement  237 mL Oral Q24H  . feeding supplement  30 mL Oral TID WC  . HYDROmorphone      . HYDROmorphone      . insulin aspart  0-20 Units Subcutaneous TID WC  . insulin glargine  10 Units Subcutaneous QHS  . levothyroxine  150 mcg Oral QAC breakfast  . multivitamin with minerals  1 tablet Oral Daily  . pantoprazole  40 mg Oral Daily  . piperacillin-tazobactam (ZOSYN)  IV  3.375 g Intravenous Q8H  . polyethylene glycol  17 g Oral BID  . senna-docusate  1 tablet Oral BID  . sorbitol  30 mL Oral Once   Continuous Infusions: . sodium chloride 100 mL/hr at 02/10/13 1327    Principal Problem:   Sepsis Active Problems:   DM (diabetes mellitus)   CKD (chronic kidney disease), stage III   Cellulitis   Morbid obesity   ARF (acute renal failure)   Diarrhea   Hyponatremia   Hypokalemia   Cellulitis and abscess of buttock    Time spent: 25 minutes.      Saffron Busey  [facility] Pager [phone]. If 7PM-7AM, please contact night-coverage at [email or web address], password [facility] 02/10/2013, 3:58 PM  LOS: 8 days

## 2013-02-11 ENCOUNTER — Encounter (HOSPITAL_COMMUNITY): Payer: Self-pay | Admitting: Surgery

## 2013-02-11 DIAGNOSIS — E119 Type 2 diabetes mellitus without complications: Secondary | ICD-10-CM

## 2013-02-11 DIAGNOSIS — L0231 Cutaneous abscess of buttock: Secondary | ICD-10-CM

## 2013-02-11 DIAGNOSIS — N179 Acute kidney failure, unspecified: Secondary | ICD-10-CM

## 2013-02-11 DIAGNOSIS — L03317 Cellulitis of buttock: Secondary | ICD-10-CM

## 2013-02-11 LAB — CBC
HCT: 23.2 % — ABNORMAL LOW (ref 36.0–46.0)
Hemoglobin: 7.8 g/dL — ABNORMAL LOW (ref 12.0–15.0)
MCH: 26.6 pg (ref 26.0–34.0)
MCHC: 33.6 g/dL (ref 30.0–36.0)
MCV: 79.2 fL (ref 78.0–100.0)
Platelets: 296 10*3/uL (ref 150–400)
RBC: 2.93 MIL/uL — ABNORMAL LOW (ref 3.87–5.11)
RDW: 16.6 % — ABNORMAL HIGH (ref 11.5–15.5)
WBC: 18.5 10*3/uL — ABNORMAL HIGH (ref 4.0–10.5)

## 2013-02-11 LAB — RENAL FUNCTION PANEL
Albumin: 1.6 g/dL — ABNORMAL LOW (ref 3.5–5.2)
BUN: 14 mg/dL (ref 6–23)
CO2: 25 mEq/L (ref 19–32)
Calcium: 8.5 mg/dL (ref 8.4–10.5)
Chloride: 108 mEq/L (ref 96–112)
Creatinine, Ser: 1.08 mg/dL (ref 0.50–1.10)
GFR calc Af Amer: 62 mL/min — ABNORMAL LOW (ref >90)
GFR calc non Af Amer: 54 mL/min — ABNORMAL LOW (ref >90)
Glucose, Bld: 211 mg/dL — ABNORMAL HIGH (ref 70–99)
Phosphorus: 3.2 mg/dL (ref 2.3–4.6)
Potassium: 3.9 mEq/L (ref 3.5–5.1)
Sodium: 138 mEq/L (ref 135–145)

## 2013-02-11 LAB — GLUCOSE, CAPILLARY
Glucose-Capillary: 147 mg/dL — ABNORMAL HIGH (ref 70–99)
Glucose-Capillary: 240 mg/dL — ABNORMAL HIGH (ref 70–99)
Glucose-Capillary: 195 mg/dL — ABNORMAL HIGH (ref 70–99)
Glucose-Capillary: 197 mg/dL — ABNORMAL HIGH (ref 70–99)

## 2013-02-11 MED ORDER — HEPARIN SODIUM (PORCINE) 5000 UNIT/ML IJ SOLN
5000.0000 [IU] | Freq: Three times a day (TID) | INTRAMUSCULAR | Status: AC
  Administered 2013-02-11 (×2): 5000 [IU] via SUBCUTANEOUS
  Filled 2013-02-11 (×2): qty 1

## 2013-02-11 NOTE — Progress Notes (Signed)
Pt talked about her health; her family; her husband and son. At times she was tearful and we talked about her tears and depression. Chaplain provided emotional support, spiritual support and insight, and listening with compassion. At the end of visit, had prayer w/pt. She was very appreciative of visit. Will follow-up w/pt. Ernest Haber Chaplain  02/11/13 1600  Clinical Encounter Type  Visited With Patient

## 2013-02-11 NOTE — Progress Notes (Signed)
NUTRITION FOLLOW UP  Intervention:   1.  Supplements; continue Glucerna Shake po daily, each supplement provides 220 kcal and 10 grams of protein.  Continue Prostat TID, each supplement provides 100 kcal, and 15g protein. 2.  Modify diet; RD to add Low [place] diet restriction.  Low tyramine diet should be followed for 2 weeks after completing zyvox regimen.  Nutrition Dx:   Increased nutrient needs, ongoing.   Goal:  1. Intake to meet at least 90% of estimated needs.  2. Verbalize basic understanding of MAOI Nutrition Therapy.   Monitor:  weight trends, lab trends, I/O's, PO intake, supplement tolerance  Assessment:   PMHx of DM, obesity, HTN, CKD III. Admitted with diarrhea x 3 days and abdominal cramps. Recent abx use for UTI approx 2-3 weeks ago. Pt with boil increasing in size on L buttock/perineal region.  Pt s/p several I&D and planning for return to OR for dressing change.   Pt previously maintained on zyvox which has been discontinued. Pt's intake at meals has improved to 100% and she is currently accepting supplements- Glucerna once daily and Prostat 2-3 times daily.   Discussed nutrition-related needs with pt, including need for increased protein intake.  Pt verbalizes understanding.     Height: Ht Readings from Last 1 Encounters:  02/02/13 5\' 4"  (1.626 m)    Weight Status:   Wt Readings from Last 1 Encounters:  02/11/13 340 lb 2.7 oz (154.3 kg)    Re-estimated needs:  Kcal: 2200-2400 Protein: 100-115g Fluid: 2.2-2.4 L/day  Skin: incision, wounds  Diet Order: Carb Control   Intake/Output Summary (Last 24 hours) at 02/11/13 1504 Last data filed at 02/11/13 1344  Gross per 24 hour  Intake    480 ml  Output   1350 ml  Net   -870 ml    Last BM: 4/28   Labs:   Recent Labs Lab 02/09/13 0500 02/10/13 0550 02/11/13 0500  NA 138 138 138  K 4.2 3.9 3.9  CL 108 105 108  CO2 22 25 25   BUN 21 14 14   CREATININE 1.01 0.94 1.08  CALCIUM 8.8 8.7 8.5   PHOS 2.8 2.7 3.2  GLUCOSE 234* 179* 211*    CBG (last 3)   Recent Labs  02/10/13 2200 02/11/13 0742 02/11/13 1209  GLUCAP 272* 147* 240*    Scheduled Meds: . allopurinol  100 mg Oral Daily  . amLODipine  5 mg Oral Daily  . cilostazol  100 mg Oral BID  . darifenacin  7.5 mg Oral Daily  . feeding supplement  237 mL Oral Q24H  . feeding supplement  30 mL Oral TID WC  . heparin subcutaneous  5,000 Units Subcutaneous Q8H  . insulin aspart  0-20 Units Subcutaneous TID WC  . insulin glargine  10 Units Subcutaneous QHS  . levothyroxine  150 mcg Oral QAC breakfast  . multivitamin with minerals  1 tablet Oral Daily  . pantoprazole  40 mg Oral Daily  . piperacillin-tazobactam (ZOSYN)  IV  3.375 g Intravenous Q8H  . polyethylene glycol  17 g Oral BID  . senna-docusate  1 tablet Oral BID    Continuous Infusions: . sodium chloride 100 mL/hr at 02/11/13 1014    Brynda Greathouse, MS RD LDN Clinical Inpatient Dietitian Pager: [phone] Weekend/After hours pager: [phone]

## 2013-02-11 NOTE — Progress Notes (Signed)
[facility] PROGRESS NOTE  [name] [name] [id number] DOB: 1951-03-04 DOA: 02/02/2013 PCP: [name], MD  Assessment/Plan: Principal Problem:   Sepsis Active Problems:   DM (diabetes mellitus)   CKD (chronic kidney disease), stage III   Cellulitis   Morbid obesity   ARF (acute renal failure)   Diarrhea   Hyponatremia   Hypokalemia   Cellulitis and abscess of buttock    1. Sepsis/Perirectal and gluteal necrotizing abscess: Patient presented with a boil of her L buttock/perineal region for 1 week which had progressively increased in size and was very painful, associatyed with chills. In the ER she was noted to be tachycardic with HR 130s, BP was low at 73/49 (improved with IVF to low 100s), WBC was 23, consistent with SIRS/Sepsis. She was initially managed with [name] [name], then Zyvox was substituted for vancomycin, due to developing ARF. Dr [name] provided surgical consultation, and patient is s/p I&D in OR per Gen Surg 02/04/13, 02/06/13 , 02/08/13, and 02/10/13. Culture grew microaerophilic strept. Clinically, sepsis has resolved, and Zyvox was discontinued on 02/09/13. Now only on Zosyn day #8. [place] is trending down, patient is afebrile, and feels considerably better. Dressing change in the OR, is planned for 02/12/13.  2. DM: Patient has known insulin-requiring DM-2. Managing currently with Lantus/SSI, as patient has had poor appetite, and there was concern for the possiblity of hypoglycemia with 70/30, which has been discontinued. CBCs are reasonably, but sub-optimally controlled at this time. Will likely add meal cover on 02/12/13.  3. ARF on CKD stage III: Unfortunately, baseline creatinine is unknown. Patient had a creatinine of 1.77 at presentation, but this climbed to a maximum of 2.79 by 02/05/13, likely due to a combination of prerenal and ATN (contrast-induced nephropathy). Dr Katrine Coho provided nephrology consultation. Managed as recommended, and as of 02/08/13,  creatinine had normalized at 1.20. As of 02/10/13, creatinine was 1.01.  4. Abnormal fluid and calcium collection in pelvis (noted via CT scan): This was an incidental finding, of unclear etiology. MRI of 02/06/13, showed interval resolution of intra pelvic fluid collection.  5. Gout: Asymptomatic. Continued on Allopurinol.  6. Hypertension: Controlled on Norvasc, started on 02/08/13.  7. Constipation: Likely due to  Opioid analgesics. Managing with Miralax and Docusate. KUB pattern suggestive of ileum. Patient tolerating meals.  7. Diarrhea: Transient/Resolved.  8. Hyponatremia: Likely related to her acute renal failure as well as volume depletion/Resolved.  9. Hypokalemia: Repleted as indicated/Resolved    Code Status: Full Code.  Family Communication:  Disposition Plan: To be determined.    Brief narrative: 62 y.o. female with PMH of DM, Obesity, HTN, CKD 3, who presented with c/o diarrhea and a gluteal boil. She c/o diarrhea for ~5 days, non bloodly and non bilious, associated with lower abdominal cramps, with h/o recent antibiotic use 2-3 weeks prior for UTI. In addition she noted a boil of her L buttock /perineal region for 1 week which had progressively increased in size and was very painful. She denied any drainage from it. She denied any fevers, but did report chills. In the ER she was noted to be tachycardic with HR 130s, BP was low at 73/49 (improved with IVF to low 100s), WBC of 23K and electrolyte abormalities.  Since admission the patient has been treated with IV fluid resuscitation and empiric antibiotics. General surgery was consulted and the patient has been taken to the operating room multiple times for I and D of her gluteal abscess. The patient stabilized and was able  to be transferred to the surgical floor on 02/05/2013.    Consultants: Nephrology.  General  Surgery   Procedures:  See notes.   Antibiotics:  Zosyn.   HPI/Subjective: Feels better, but still has  perirectal pain. Appetite is improving.   Objective: Vital signs in last 24 hours: Temp:  [98.5 F (36.9 C)-98.6 F (37 C)] 98.6 F (37 C) (05/07 0506) Pulse Rate:  [95-113] 95 (05/07 0506) Resp:  [18] 18 (05/07 0506) BP: (125-134)/(51-62) 125/51 mmHg (05/07 0506) SpO2:  [99 %-100 %] 99 % (05/07 0506) Weight:  [154.3 kg (340 lb 2.7 oz)] 154.3 kg (340 lb 2.7 oz) (05/07 0506) Weight change: 1.7 kg (3 lb 12 oz) Last BM Date: 02/02/13  Intake/Output from previous day: 05/06 0701 - 05/07 0700 In: 960 [P.O.:300; I.V.:660] Out: 1500 [Urine:1450; Blood:50] Total I/O In: 240 [P.O.:240] Out: -    Physical Exam: General: Comfortable, alert, communicative, fully oriented, not short of breath at rest.  HEENT:  Moderate clinical pallor, no jaundice, no conjunctival injection or discharge. NECK:  Supple, JVP not seen, no carotid bruits, no palpable lymphadenopathy, no palpable goiter. CHEST:  Clinically clear to auscultation, no wheezes, no crackles. HEART:  Sounds 1 and 2 heard, normal, regular, no murmurs. ABDOMEN:  Morbidly obese, soft, non-tender, no palpable organomegaly, no palpable masses, normal bowel sounds. GENITALIA:  Not examined.  BUTTOCKS: Gluteal/sacral  region is under heavy dressings, but sanguinous drainage is evident. . LOWER EXTREMITIES:  No pitting edema, palpable peripheral pulses. MUSCULOSKELETAL SYSTEM:  Generalized osteoarthritic changes, otherwise, normal. CENTRAL NERVOUS SYSTEM:  No focal neurologic deficit on gross examination.  Lab Results:  Recent Labs  02/10/13 0550 02/11/13 0500  WBC 19.2* 18.5*  HGB 8.3* 7.8*  HCT 25.1* 23.2*  PLT 327 296    Recent Labs  02/10/13 0550 02/11/13 0500  NA 138 138  K 3.9 3.9  CL 105 108  CO2 25 25  GLUCOSE 179* 211*  BUN 14 14  CREATININE 0.94 1.08  CALCIUM 8.7 8.5   Recent Results (from the past 240 hour(s))  CULTURE, BLOOD (ROUTINE X 2)     Status: None   Collection Time    02/02/13 10:27 AM       Result Value Range Status   Specimen Description BLOOD FOREARM RIGHT   Final   Special Requests BOTTLES DRAWN AEROBIC ONLY 5CC   Final   Culture  Setup Time 02/02/2013 15:05   Final   Culture NO GROWTH 5 DAYS   Final   Report Status 02/08/2013 FINAL   Final  CULTURE, BLOOD (ROUTINE X 2)     Status: None   Collection Time    02/02/13 10:35 AM      Result Value Range Status   Specimen Description BLOOD HAND RIGHT   Final   Special Requests BOTTLES DRAWN AEROBIC AND ANAEROBIC 10CC   Final   Culture  Setup Time 02/02/2013 15:05   Final   Culture NO GROWTH 5 DAYS   Final   Report Status 02/08/2013 FINAL   Final  URINE CULTURE     Status: None   Collection Time    02/02/13 12:23 PM      Result Value Range Status   Specimen Description URINE, CATHETERIZED   Final   Special Requests ADDED [id number]   Final   Culture  Setup Time 02/02/2013 22:37   Final   Colony Count NO GROWTH   Final   Culture NO GROWTH   Final   Report  Status 02/03/2013 FINAL   Final  URINE CULTURE     Status: None   Collection Time    02/03/13  6:08 PM      Result Value Range Status   Specimen Description URINE, CATHETERIZED   Final   Special Requests NONE   Final   Culture  Setup Time 02/03/2013 20:04   Final   Colony Count NO GROWTH   Final   Culture NO GROWTH   Final   Report Status 02/05/2013 FINAL   Final  CULTURE, ROUTINE-ABSCESS     Status: None   Collection Time    02/04/13 12:11 PM      Result Value Range Status   Specimen Description ABSCESS   Final   Special Requests LEFT GLUTEAL PT ON ZOSYN   Final   Gram Stain     Final   Value: FEW WBC PRESENT,BOTH PMN AND MONONUCLEAR     NO SQUAMOUS EPITHELIAL CELLS SEEN     MODERATE GRAM POSITIVE COCCI     IN PAIRS   Culture     Final   Value: FEW MICROAEROPHILIC STREPTOCOCCI     Note: Standardized susceptibility testing for this organism is not available.   Report Status 02/07/2013 FINAL   Final  ANAEROBIC CULTURE     Status: None   Collection Time     02/04/13 12:11 PM      Result Value Range Status   Specimen Description ABSCESS   Final   Special Requests LEFT GLUTEAL PT ON ZOSYN   Final   Gram Stain     Final   Value: FEW WBC PRESENT,BOTH PMN AND MONONUCLEAR     NO SQUAMOUS EPITHELIAL CELLS SEEN     MODERATE GRAM POSITIVE COCCI     IN PAIRS   Culture NO ANAEROBES ISOLATED   Final   Report Status 2013-02-20 FINAL   Final  MRSA PCR SCREENING     Status: None   Collection Time    02/08/13 11:15 PM      Result Value Range Status   MRSA by PCR NEGATIVE  NEGATIVE Final   Comment:            The GeneXpert MRSA Assay (FDA     approved for NASAL specimens     only), is one component of a     comprehensive MRSA colonization     surveillance program. It is not     intended to diagnose MRSA     infection nor to guide or     monitor treatment for     MRSA infections.     Studies/Results: Dg Abd 2 Views  Feb 20, 2013  *RADIOLOGY REPORT*  Clinical Data: Abdominal pain.  Constipation.  ABDOMEN - 2 VIEW  Comparison: CT abdomen and pelvis 02/02/2013.  Findings: No free intraperitoneal air is identified.  There is a large volume of stool in the ascending and transverse colon. Multiple gas filled, dilated loops of small bowel are identified. The patient appears to have a left inguinal hernia with a loop of colon within it.  IMPRESSION:  1.  Bowel gas pattern most suggestive of ileus. 2.  Likely left inguinal hernia.   Original Report Authenticated By: Orlean Patten, M.D.     Medications: Scheduled Meds: . allopurinol  100 mg Oral Daily  . amLODipine  5 mg Oral Daily  . cilostazol  100 mg Oral BID  . darifenacin  7.5 mg Oral Daily  . feeding supplement  237 mL Oral Q24H  .  feeding supplement  30 mL Oral TID WC  . heparin subcutaneous  5,000 Units Subcutaneous Q8H  . insulin aspart  0-20 Units Subcutaneous TID WC  . insulin glargine  10 Units Subcutaneous QHS  . levothyroxine  150 mcg Oral QAC breakfast  . multivitamin with minerals  1  tablet Oral Daily  . pantoprazole  40 mg Oral Daily  . piperacillin-tazobactam (ZOSYN)  IV  3.375 g Intravenous Q8H  . polyethylene glycol  17 g Oral BID  . senna-docusate  1 tablet Oral BID   Continuous Infusions: . sodium chloride 100 mL/hr at 02/11/13 1014   PRN Meds:.acetaminophen, albuterol, fluticasone, HYDROmorphone (DILAUDID) injection, ondansetron (ZOFRAN) IV, oxyCODONE, sodium chloride    LOS: 9 days   Sierrah Luevano,CHRISTOPHER  [facility] Pager [phone]. If 8PM-8AM, please contact night-coverage at [email or web address], password [facility] 02/11/2013, 12:27 PM  LOS: 9 days

## 2013-02-11 NOTE — Progress Notes (Signed)
Slightly less tender Will plan dressing change in the OR tomorrow.    The surgical procedure has been discussed with the patient.  Potential risks, benefits, alternative treatments, and expected outcomes have been explained.  All of the patient's questions at this time have been answered.  The likelihood of reaching the patient's treatment goal is good.  The patient understand the proposed surgical procedure and wishes to proceed.  [name]. Georgette Dover, MD, [facility]  General/ Trauma Surgery  02/11/2013 11:55 AM

## 2013-02-11 NOTE — Plan of Care (Signed)
Problem: Phase II Progression Outcomes Goal: Discharge plan established Recommend HH OT for ADL trg and ADL mobility safety trg; may need SNF depending on progress after acute care d/c

## 2013-02-11 NOTE — Progress Notes (Signed)
Inpatient Diabetes Program Recommendations  AACE/ADA: New Consensus Statement on Inpatient Glycemic Control (2013)  Target Ranges:  Prepandial:   less than 140 mg/dL      Peak postprandial:   less than 180 mg/dL (1-2 hours)      Critically ill patients:  140 - 180 mg/dL   Inpatient Diabetes Program Recommendations Insulin - Meal Coverage: add Novolog 5 units TID with meals (must eat 50% of meal)  Note: Will be NPO at [place] tonight but once eating again, recommend adding Novolog with meals for elevated postprandial CBGs. Thank you  Raoul Pitch BSN, RN,CDE Inpatient Diabetes Coordinator [phone] (team pager)

## 2013-02-11 NOTE — Evaluation (Signed)
Occupational Therapy Evaluation Patient Details Name: [name] [name] MRN: [id number] DOB: 1950-11-24 Today's Date: 02/11/2013 Time: [id number] OT Time Calculation (min): 25 min  OT Assessment / Plan / Recommendation Clinical Impression  Pt demos decline in function with ADLs and ADL mobility following multiple I and D's for necrotizing abscess on buttocks. Pt would benefit from OT services to address impairments to  help retsore PLOF to return home safely. Pt became tearful during session    OT Assessment  Patient needs continued OT Services    Follow Up Recommendations  Home health OT;SNF;Other (comment) (depending on acute stay progress, may need SNF)    Barriers to Discharge Decreased caregiver support;None    Equipment Recommendations   None   Recommendations for Other Services    Frequency  Min 2X/week    Precautions / Restrictions Precautions Precautions: Fall Restrictions Weight Bearing Restrictions: No   Pertinent Vitals/Pain 6/10 L buttock surgical area    ADL  Grooming: Performed;Wash/dry hands;Wash/dry face;Min guard Where Assessed - Grooming: Supported standing Upper Body Bathing: Simulated;Supervision/safety;Set up Lower Body Bathing: Simulated;Maximal assistance Upper Body Dressing: Performed;Supervision/safety;Set up Lower Body Dressing: Performed;Maximal assistance Toilet Transfer: Performed;Minimal assistance Toilet Transfer Method: Sit to stand Toilet Transfer Equipment: Raised toilet seat with arms (or 3-in-1 over toilet);Regular height toilet;Grab bars Toileting - Clothing Manipulation and Hygiene: Simulated;Minimal assistance;Moderate assistance Where Assessed - Toileting Clothing Manipulation and Hygiene: Standing Transfers/Ambulation Related to ADLs: VCs for safe hand placement    OT Diagnosis: Generalized weakness;Acute pain  OT Problem List: Decreased strength;Decreased activity tolerance;Pain;Impaired balance (sitting and/or standing);Decreased  knowledge of use of DME or AE OT Treatment Interventions: Self-care/ADL training;Balance training;Therapeutic exercise;Neuromuscular education;Therapeutic activities;DME and/or AE instruction;Patient/family education   OT Goals Acute Rehab OT Goals OT Goal Formulation: With patient Time For Goal Achievement: 02/18/13 Potential to Achieve Goals: Good ADL Goals Pt Will Perform Grooming: with set-up;with supervision;Standing at sink ADL Goal: Grooming - Progress: Goal set today Pt Will Perform Lower Body Bathing: with min assist;with mod assist ADL Goal: Lower Body Bathing - Progress: Goal set today Pt Will Perform Lower Body Dressing: with min assist;with mod assist ADL Goal: Lower Body Dressing - Progress: Goal set today Pt Will Transfer to Toilet: with supervision;with DME;Grab bars ADL Goal: Toilet Transfer - Progress: Goal set today Pt Will Perform Toileting - Clothing Manipulation: with supervision;with min assist ADL Goal: Toileting - Clothing Manipulation - Progress: Goal set today Pt Will Perform Toileting - Hygiene: with supervision;with min assist ADL Goal: Toileting - Hygiene - Progress: Goal set today  Visit Information  Last OT Received On: 02/11/13 Assistance Needed: +1    Subjective Data  Subjective: " My butt just hurts " Patient Stated Goal: To return home   Prior [place] Lives With: Family;Other (Comment) (nephew) Available Help at Discharge: Family Type of Home: House Home Access: Stairs to enter [employer] of Steps: several Home Layout: Two level Alternate Level Stairs-Number of Steps: flight Alternate Level Stairs-Rails: Right;Left Bathroom Shower/Tub: Tub/shower unit;Curtain Bathroom Toilet: Standard Bathroom Accessibility: Yes How Accessible: Accessible via wheelchair [place]: Bedside commode/3-in-1;Shower chair with back;Tub transfer bench;Straight cane Prior Function Level of Independence:  Independent with assistive device(s) Able to Take Stairs?: Yes Driving: No Vocation: Unemployed Communication Communication: No difficulties Dominant Hand: Right         Vision/Perception Vision - History Baseline Vision: Wears glasses only for reading Patient Visual Report: No change from baseline Perception Perception: Within Functional Limits   Cognition  Cognition Arousal/Alertness:  Awake/alert Behavior During Therapy: WFL for tasks assessed/performed Overall Cognitive Status: Within Functional Limits for tasks assessed    Extremity/Trunk Assessment Right Upper Extremity Assessment RUE ROM/Strength/Tone: Within functional levels RUE Sensation: WFL - Light Touch;WFL - Proprioception RUE Coordination: WFL - gross/fine motor Left Upper Extremity Assessment LUE ROM/Strength/Tone: Within functional levels LUE Sensation: WFL - Light Touch;WFL - Proprioception LUE Coordination: WFL - gross/fine motor     Mobility Bed Mobility Bed Mobility: Rolling Right;Right Sidelying to Sit;Sitting - Scoot to Edge of Bed;Sit to Sidelying Right Rolling Right: 6: Modified independent (Device/Increase time);With rail Right Sidelying to Sit: With rails;HOB flat;4: Min guard Sitting - Scoot to Edge of Bed: 5: Supervision Sit to Sidelying Right: 4: Min assist;With rail;HOB flat Transfers Transfers: Sit to Stand;Stand to Sit Sit to Stand: 4: Min assist;With upper extremity assist;From bed;From chair/3-in-1;From toilet Stand to Sit: With upper extremity assist;To bed;4: Min guard;To chair/3-in-1;To toilet Details for Transfer Assistance: vc's for hand placement     Exercise     Balance Balance Balance Assessed: Yes Static Standing Balance Static Standing - Balance Support: Bilateral upper extremity supported Static Standing - Level of Assistance: 4: Min assist;5: Stand by assistance Dynamic Standing Balance Dynamic Standing - Balance Support: Bilateral upper extremity supported;During  functional activity Dynamic Standing - Level of Assistance: 4: Min assist   End of Session OT - End of Session Equipment Utilized During Treatment: Gait belt;Other (comment) (RW, BSC)  GO     Britt Bottom 02/11/2013, 1:28 PM

## 2013-02-11 NOTE — Progress Notes (Signed)
1 Day Post-Op  Subjective: Pt is feeling okay.  C/o soreness from buttock wounds.  Pt denies abdominal pain.  Pt tolerating reg diet.  Urinating, but no BM's per records??  Ambulating with PT.  Objective: Vital signs in last 24 hours: Temp:  [98.1 F (36.7 C)-98.6 F (37 C)] 98.6 F (37 C) (05/07 0506) Pulse Rate:  [95-116] 95 (05/07 0506) Resp:  [10-29] 18 (05/07 0506) BP: (125-190)/(51-124) 125/51 mmHg (05/07 0506) SpO2:  [95 %-100 %] 99 % (05/07 0506) Weight:  [340 lb 2.7 oz (154.3 kg)] 340 lb 2.7 oz (154.3 kg) (05/07 0506) Last BM Date: 02/02/13  Intake/Output from previous day: 05/06 0701 - 05/07 0700 In: 960 [P.O.:300; I.V.:660] Out: 1500 [Urine:1450; Blood:50] Intake/Output this shift: Total I/O In: 240 [P.O.:240] Out: -   PE: Gen:  Alert, NAD, pleasant, emotional and crying Groin wound:  Deferred, will look at the wound in the OR tomorrow   Lab Results:   Recent Labs  02/10/13 0550 02/11/13 0500  WBC 19.2* 18.5*  HGB 8.3* 7.8*  HCT 25.1* 23.2*  PLT 327 296   BMET  Recent Labs  02/10/13 0550 02/11/13 0500  NA 138 138  K 3.9 3.9  CL 105 108  CO2 25 25  GLUCOSE 179* 211*  BUN 14 14  CREATININE 0.94 1.08  CALCIUM 8.7 8.5   PT/INR No results found for this basename: LABPROT, INR,  in the last 72 hours CMP     Component Value Date/Time   NA 138 02/11/2013 0500   K 3.9 02/11/2013 0500   CL 108 02/11/2013 0500   CO2 25 02/11/2013 0500   GLUCOSE 211* 02/11/2013 0500   BUN 14 02/11/2013 0500   CREATININE 1.08 02/11/2013 0500   CALCIUM 8.5 02/11/2013 0500   PROT 5.8* 02/03/2013 0515   ALBUMIN 1.6* 02/11/2013 0500   AST 36 02/03/2013 0515   ALT 37* 02/03/2013 0515   ALKPHOS 122* 02/03/2013 0515   BILITOT 1.0 02/03/2013 0515   GFRNONAA 54* 02/11/2013 0500   GFRAA 62* 02/11/2013 0500   Lipase     Component Value Date/Time   LIPASE 17 02/02/2013 0850       Studies/Results: Dg Abd 2 Views  02/21/13  *RADIOLOGY REPORT*  Clinical Data: Abdominal pain.   Constipation.  ABDOMEN - 2 VIEW  Comparison: CT abdomen and pelvis 02/02/2013.  Findings: No free intraperitoneal air is identified.  There is a large volume of stool in the ascending and transverse colon. Multiple gas filled, dilated loops of small bowel are identified. The patient appears to have a left inguinal hernia with a loop of colon within it.  IMPRESSION:  1.  Bowel gas pattern most suggestive of ileus. 2.  Likely left inguinal hernia.   Original Report Authenticated By: Orlean Patten, M.D.     Anti-infectives: Anti-infectives   Start     Dose/Rate Route Frequency Ordered Stop   02/03/13 1500  linezolid (ZYVOX) IVPB 600 mg  Status:  Discontinued     600 mg 300 mL/hr over 60 Minutes Intravenous Every 12 hours 02/03/13 1353 02/21/2013 1056   02/03/13 1400  vancomycin (VANCOCIN) 1,750 mg in sodium chloride 0.9 % 500 mL IVPB  Status:  Discontinued     1,750 mg 250 mL/hr over 120 Minutes Intravenous Every 24 hours 02/02/13 1356 02/03/13 1443   02/02/13 1730  piperacillin-tazobactam (ZOSYN) IVPB 3.375 g     3.375 g 12.5 mL/hr over 240 Minutes Intravenous Every 8 hours 02/02/13 1646  02/02/13 1400  vancomycin (VANCOCIN) 2,000 mg in sodium chloride 0.9 % 500 mL IVPB     2,000 mg 250 mL/hr over 120 Minutes Intravenous  Once 02/02/13 1355 02/02/13 1701       Assessment/Plan Perirectal and gluteal necrotizing soft tissue infection & abscess HD #10 s/p multiple I&D abscess and dressing changes most recent yesterday and now has 2 penrose drains 1. Cultures growing microaerophilic strep, Return to OR tomorrow for re-debridement/dressing change (thurs)  2. IVF, pain control, IV antibiotics 3. Mobilize and IS  4. SCD's, restart heparin, hold for OR tomorrow  5. Carb mod diet now and NPO after midnight     LOS: 9 days    [name] [name], [name] [name] 02/11/2013, 10:08 AM Pager: [phone]

## 2013-02-12 ENCOUNTER — Encounter (HOSPITAL_COMMUNITY): Payer: Self-pay | Admitting: Anesthesiology

## 2013-02-12 ENCOUNTER — Inpatient Hospital Stay (HOSPITAL_COMMUNITY): Payer: PRIVATE HEALTH INSURANCE | Admitting: Anesthesiology

## 2013-02-12 ENCOUNTER — Encounter (HOSPITAL_COMMUNITY)
Admission: EM | Disposition: A | Discharge status: Skilled Nursing Facility | Payer: Self-pay | Source: Home / Self Care | Attending: Internal Medicine

## 2013-02-12 HISTORY — PX: INCISION AND DRAINAGE ABSCESS: SHX5864

## 2013-02-12 LAB — CBC
HCT: 23.3 % — ABNORMAL LOW (ref 36.0–46.0)
Hemoglobin: 7.6 g/dL — ABNORMAL LOW (ref 12.0–15.0)
MCH: 26 pg (ref 26.0–34.0)
MCHC: 32.6 g/dL (ref 30.0–36.0)
MCV: 79.8 fL (ref 78.0–100.0)
Platelets: 321 10*3/uL (ref 150–400)
RBC: 2.92 MIL/uL — ABNORMAL LOW (ref 3.87–5.11)
RDW: 16.7 % — ABNORMAL HIGH (ref 11.5–15.5)
WBC: 16.6 10*3/uL — ABNORMAL HIGH (ref 4.0–10.5)

## 2013-02-12 LAB — GLUCOSE, CAPILLARY
Glucose-Capillary: 187 mg/dL — ABNORMAL HIGH (ref 70–99)
Glucose-Capillary: 177 mg/dL — ABNORMAL HIGH (ref 70–99)
Glucose-Capillary: 166 mg/dL — ABNORMAL HIGH (ref 70–99)
Glucose-Capillary: 192 mg/dL — ABNORMAL HIGH (ref 70–99)
Glucose-Capillary: 165 mg/dL — ABNORMAL HIGH (ref 70–99)

## 2013-02-12 SURGERY — INCISION AND DRAINAGE, ABSCESS
Anesthesia: General | Wound class: Dirty or Infected

## 2013-02-12 MED ORDER — INSULIN ASPART 100 UNIT/ML ~~LOC~~ SOLN
4.0000 [IU] | Freq: Three times a day (TID) | SUBCUTANEOUS | Status: DC
  Administered 2013-02-12 – 2013-02-13 (×3): 4 [IU] via SUBCUTANEOUS

## 2013-02-12 MED ORDER — LACTATED RINGERS IV SOLN
INTRAVENOUS | Status: DC | PRN
  Administered 2013-02-12: 09:00:00 via INTRAVENOUS

## 2013-02-12 MED ORDER — METOCLOPRAMIDE HCL 5 MG/ML IJ SOLN: 10.0000 mg | Freq: Once | INTRAMUSCULAR | Status: DC | PRN

## 2013-02-12 MED ORDER — PROPOFOL 10 MG/ML IV BOLUS
INTRAVENOUS | Status: DC | PRN
  Administered 2013-02-12: 300 mg via INTRAVENOUS

## 2013-02-12 MED ORDER — MIDAZOLAM HCL 5 MG/5ML IJ SOLN
INTRAMUSCULAR | Status: DC | PRN
  Administered 2013-02-12: 2 mg via INTRAVENOUS

## 2013-02-12 MED ORDER — 0.9 % SODIUM CHLORIDE (POUR BTL) OPTIME
TOPICAL | Status: DC | PRN
  Administered 2013-02-12: 1000 mL

## 2013-02-12 MED ORDER — FENTANYL CITRATE 0.05 MG/ML IJ SOLN
100.0000 ug | Freq: Once | INTRAMUSCULAR | Status: AC
  Administered 2013-02-12: 25 ug via INTRAVENOUS

## 2013-02-12 MED ORDER — HYDROMORPHONE HCL PF 1 MG/ML IJ SOLN
INTRAMUSCULAR | Status: AC
  Filled 2013-02-12: qty 1

## 2013-02-12 MED ORDER — FENTANYL CITRATE 0.05 MG/ML IJ SOLN
INTRAMUSCULAR | Status: AC
  Administered 2013-02-12: 50 ug via INTRAVENOUS
  Filled 2013-02-12: qty 2

## 2013-02-12 MED ORDER — FENTANYL CITRATE 0.05 MG/ML IJ SOLN
INTRAMUSCULAR | Status: DC | PRN
  Administered 2013-02-12: 100 ug via INTRAVENOUS

## 2013-02-12 MED ORDER — SUCCINYLCHOLINE CHLORIDE 20 MG/ML IJ SOLN
INTRAMUSCULAR | Status: DC | PRN
  Administered 2013-02-12: 140 mg via INTRAVENOUS

## 2013-02-12 MED ORDER — LIDOCAINE HCL (CARDIAC) 20 MG/ML IV SOLN
INTRAVENOUS | Status: DC | PRN
  Administered 2013-02-12: 100 mg via INTRAVENOUS

## 2013-02-12 MED ORDER — HYDROMORPHONE HCL PF 1 MG/ML IJ SOLN
0.2500 mg | INTRAMUSCULAR | Status: DC | PRN
  Administered 2013-02-12 (×2): 0.5 mg via INTRAVENOUS

## 2013-02-12 MED ORDER — OXYCODONE HCL 5 MG PO TABS: 5.0000 mg | ORAL_TABLET | Freq: Once | ORAL | Status: DC | PRN

## 2013-02-12 MED ORDER — OXYCODONE HCL 5 MG/5ML PO SOLN: 5.0000 mg | Freq: Once | ORAL | Status: DC | PRN

## 2013-02-12 MED ORDER — SODIUM CHLORIDE 0.9 % IR SOLN
Status: DC | PRN
  Administered 2013-02-12: 3000 mL

## 2013-02-12 MED ORDER — ONDANSETRON HCL 4 MG/2ML IJ SOLN
INTRAMUSCULAR | Status: DC | PRN
  Administered 2013-02-12: 4 mg via INTRAVENOUS

## 2013-02-12 SURGICAL SUPPLY — 31 items
BANDAGE GAUZE ELAST BULKY 4 IN (GAUZE/BANDAGES/DRESSINGS) ×4 IMPLANT
BLADE SURG 15 STRL LF DISP TIS (BLADE) ×1 IMPLANT
BLADE SURG 15 STRL SS (BLADE) ×1
BLADE SURG ROTATE 9660 (MISCELLANEOUS) IMPLANT
CANISTER SUCTION 2500CC (MISCELLANEOUS) ×2 IMPLANT
CLOTH BEACON ORANGE TIMEOUT ST (SAFETY) ×2 IMPLANT
COVER SURGICAL LIGHT HANDLE (MISCELLANEOUS) ×2 IMPLANT
DRAIN PENROSE 1X12 LTX STRL (DRAIN) ×2 IMPLANT
DRAPE LAPAROSCOPIC ABDOMINAL (DRAPES) ×2 IMPLANT
DRSG PAD ABDOMINAL 8X10 ST (GAUZE/BANDAGES/DRESSINGS) ×4 IMPLANT
ELECT REM PT RETURN 9FT ADLT (ELECTROSURGICAL) ×2
ELECTRODE REM PT RTRN 9FT ADLT (ELECTROSURGICAL) ×1 IMPLANT
GAUZE SPONGE 4X4 16PLY XRAY LF (GAUZE/BANDAGES/DRESSINGS) ×2 IMPLANT
GLOVE BIO SURGEON STRL SZ7 (GLOVE) ×2 IMPLANT
GLOVE BIOGEL PI IND STRL 7.5 (GLOVE) ×1 IMPLANT
GLOVE BIOGEL PI INDICATOR 7.5 (GLOVE) ×1
GOWN STRL NON-REIN LRG LVL3 (GOWN DISPOSABLE) ×4 IMPLANT
KIT BASIN OR (CUSTOM PROCEDURE TRAY) ×2 IMPLANT
KIT ROOM TURNOVER OR (KITS) ×2 IMPLANT
NS IRRIG 1000ML POUR BTL (IV SOLUTION) ×2 IMPLANT
PACK LITHOTOMY IV (CUSTOM PROCEDURE TRAY) ×2 IMPLANT
PAD ARMBOARD 7.5X6 YLW CONV (MISCELLANEOUS) ×4 IMPLANT
SPONGE GAUZE 4X4 12PLY (GAUZE/BANDAGES/DRESSINGS) ×2 IMPLANT
SPONGE LAP 18X18 X RAY DECT (DISPOSABLE) IMPLANT
SUT ETHILON 2 0 FS 18 (SUTURE) ×2 IMPLANT
TAPE CLOTH SURG 6X10 WHT LF (GAUZE/BANDAGES/DRESSINGS) ×2 IMPLANT
TOWEL OR 17X24 6PK STRL BLUE (TOWEL DISPOSABLE) ×2 IMPLANT
TOWEL OR 17X26 10 PK STRL BLUE (TOWEL DISPOSABLE) ×2 IMPLANT
TUBE CONNECTING 12X1/4 (SUCTIONS) ×2 IMPLANT
WATER STERILE IRR 1000ML POUR (IV SOLUTION) ×2 IMPLANT
YANKAUER SUCT BULB TIP NO VENT (SUCTIONS) ×2 IMPLANT

## 2013-02-12 NOTE — Anesthesia Procedure Notes (Addendum)
Performed by: Izora Gala    Procedure Name: Intubation Date/Time: 02/12/2013 9:21 AM Performed by: Izora Gala Pre-anesthesia Checklist: Patient identified, Emergency Drugs available, Suction available, Patient being monitored and Timeout performed Patient Re-evaluated:Patient Re-evaluated prior to inductionOxygen Delivery Method: Circle system utilized Preoxygenation: Pre-oxygenation with 100% oxygen Intubation Type: IV induction Ventilation: Mask ventilation without difficulty and Oral airway inserted - appropriate to patient size Laryngoscope Size: Miller and 2 Grade View: Grade II Tube size: 7.5 mm Number of attempts: 1 (Intubation by Melene Muller CRNA) Airway Equipment and Method: Stylet Placement Confirmation: ETT inserted through vocal cords under direct vision,  breath sounds checked- equal and bilateral and positive ETCO2 Secured at: 23 cm Tube secured with: Tape Dental Injury: Teeth and Oropharynx as per pre-operative assessment

## 2013-02-12 NOTE — Op Note (Signed)
Preop diagnosis: necrotizing soft tissue infection of the left perirectal, perineal, and gluteal regions  Postop diagnosis: Same  Procedure performed: Irrigation and debridement of left perirectal, peroneal, and gluteal regions with placement of Penrose drains  Surgeon:Namrata Dangler K.  Anesthesia: Gen. Endotracheal  Indications:this is a 62 year old female who has had multiple debridements of a large left buttock abscess with necrosis. This extends up into her left labia and down around the left pararectal regions. The wound is too large and far too tender for bedside dressing changes at this time.  Description of procedure: The patient brought to the operating room and placed in a supine position on the operating room table. After an adequate level of general anesthesia was obtained her legs were placed in yellowfin stirrups in lithotomy position. The old dressing was removed. Was prepped with Betadine and draped in sterile fashion. A timeout was taken. We inspected the wound. The Penrose drains remain in place in the labia and posteriorly in the buttock. There seems to be another couple of deep areas posteriorly.  There is some undermining of about 6 cm under the skin heading out into the buttock. Anteriorly the skin is undermined on the labia. Minimal purulence was noted in this area. We thoroughly irrigated the wound with pulse lavage using 3 L of normal saline. I then placed 1 inch Penrose drains in the posterior location heading down into the undermined area. This was sutured in place with 2-0 nylon sutures.  There is one penrose drain anteriorly under the labia and a total of three penrose drains posteriorly in deep pockets.  Overall the sides of the wound are beginning to granulate. The wound was then packed with 2 rolls of Kerlix gauze soaked in saline. A dry dressing was applied. The patient was then extubated and brought to the recovery was stable condition. All sponge, initially, and needle counts  are correct.  [name]. Georgette Dover, MD, [facility] General Par Surgery  General/ Trauma Surgery  02/12/2013 10:15 AM

## 2013-02-12 NOTE — Progress Notes (Signed)
[facility] PROGRESS NOTE  Hanalei Mockus [id number] DOB: [birth year] DOA: 02/02/2013 PCP: [name], MD  Assessment/Plan: Principal Problem:   Sepsis Active Problems:   DM (diabetes mellitus)   CKD (chronic kidney disease), stage III   Cellulitis   Morbid obesity   ARF (acute renal failure)   Diarrhea   Hyponatremia   Hypokalemia   Cellulitis and abscess of buttock    1. Sepsis/Perirectal and gluteal necrotizing abscess: Patient presented with a boil of her L buttock/perineal region for 1 week which had progressively increased in size and was very painful, associatyed with chills. In the ER she was noted to be tachycardic with HR 130s, BP was low at 73/49 (improved with IVF to low 100s), WBC was 23, consistent with SIRS/Sepsis. She was initially managed with [name] [name], then Zyvox was substituted for vancomycin, due to developing ARF. Dr [name] provided surgical consultation, and patient is s/p I&D in OR per Gen Surg 02/04/13, 02/06/13 , 02/08/13, and 02/10/13. Culture grew microaerophilic strept. Clinically, sepsis has resolved, and Zyvox was discontinued on 02/09/13. Now only on Zosyn day #8. Isabella is trending down, patient is afebrile, and feels considerably better. Patient is s/p irrigation and debridement of left perirectal, peroneal, and gluteal regions with placement of Penrose drains by Dr Georgette Dover today.  2. DM: Patient has known insulin-requiring DM-2. Managing currently with Lantus/SSI, as patient has had poor appetite, and there was concern for the possiblity of hypoglycemia with 70/30, which has been discontinued. CBCs are reasonably, but sub-optimally controlled at this time. Have added meal cover on 02/12/13.  3. ARF on CKD stage III: Unfortunately, baseline creatinine is unknown. Patient had a creatinine of 1.77 at presentation, but this climbed to a maximum of 2.79 by 02/05/13, likely due to a combination of prerenal and ATN (contrast-induced nephropathy). Dr  Katrine Coho provided nephrology consultation. Managed as recommended, and as of 02/08/13, creatinine had normalized at 1.20. As of 02/10/13, creatinine was 1.01.  4. Abnormal fluid and calcium collection in pelvis (noted via CT scan): This was an incidental finding, of unclear etiology. MRI of 02/06/13, showed interval resolution of intra pelvic fluid collection.  5. Gout: Asymptomatic. Continued on Allopurinol.  6. Hypertension: Controlled on Norvasc, started on 02/08/13.  7. Constipation: Likely due to  Opioid analgesics. Managing with Miralax and Docusate. KUB pattern suggestive of ileum. Patient tolerating meals.  7. Diarrhea: Transient/Resolved.  8. Hyponatremia: Likely related to her acute renal failure as well as volume depletion/Resolved.  9. Hypokalemia: Repleted as indicated/Resolved    Code Status: Full Code.  Family Communication:  Disposition Plan: To be determined.    Brief narrative: 62 y.o. female with PMH of DM, Obesity, HTN, CKD 3, who presented with c/o diarrhea and a gluteal boil. She c/o diarrhea for ~5 days, non bloodly and non bilious, associated with lower abdominal cramps, with h/o recent antibiotic use 2-3 weeks prior for UTI. In addition she noted a boil of her L buttock /perineal region for 1 week which had progressively increased in size and was very painful. She denied any drainage from it. She denied any fevers, but did report chills. In the ER she was noted to be tachycardic with HR 130s, BP was low at 73/49 (improved with IVF to low 100s), WBC of 23K and electrolyte abormalities.  Since admission the patient has been treated with IV fluid resuscitation and empiric antibiotics. General surgery was consulted and the patient has been taken to the operating room multiple times for I  and D of her gluteal abscess. The patient stabilized and was able to be transferred to the surgical floor on 02/05/2013.    Consultants: Nephrology.  General   Surgery   Procedures:  See notes.   Antibiotics:  Zosyn.   HPI/Subjective: Has some pain, immediately post-op today, otherwise, no new issues. .   Objective: Vital signs in last 24 hours: Temp:  [97.7 F (36.5 C)-98.9 F (37.2 C)] 98.1 F (36.7 C) (05/08 1119) Pulse Rate:  [96-113] 97 (05/08 1120) Resp:  [10-22] 14 (05/08 1120) BP: (138-203)/(63-97) 138/63 mmHg (05/08 1120) SpO2:  [96 %-100 %] 100 % (05/08 1120) Weight change:  Last BM Date: 02/02/13  Intake/Output from previous day: 05/07 0701 - 05/08 0700 In: 3316.7 [P.O.:240; I.V.:3026.7; IV Piggyback:50] Out: 850 [Urine:850] Total I/O In: 450 [I.V.:450] Out: 350 [Urine:350]   Physical Exam: General: Comfortable, alert, communicative, fully oriented, not short of breath at rest.  HEENT:  Moderate clinical pallor, no jaundice, no conjunctival injection or discharge. NECK:  Supple, JVP not seen, no carotid bruits, no palpable lymphadenopathy, no palpable goiter. CHEST:  Clinically clear to auscultation, no wheezes, no crackles. HEART:  Sounds 1 and 2 heard, normal, regular, no murmurs. ABDOMEN:  Morbidly obese, soft, non-tender, no palpable organomegaly, no palpable masses, normal bowel sounds. GENITALIA:  Not examined.  BUTTOCKS: Not examined today, as post-op. LOWER EXTREMITIES:  No pitting edema, palpable peripheral pulses. MUSCULOSKELETAL SYSTEM:  Generalized osteoarthritic changes, otherwise, normal. CENTRAL NERVOUS SYSTEM:  No focal neurologic deficit on gross examination.  Lab Results:  Recent Labs  02/11/13 0500 02/12/13 0450  WBC 18.5* 16.6*  HGB 7.8* 7.6*  HCT 23.2* 23.3*  PLT 296 321    Recent Labs  02/10/13 0550 02/11/13 0500  NA 138 138  K 3.9 3.9  CL 105 108  CO2 25 25  GLUCOSE 179* 211*  BUN 14 14  CREATININE 0.94 1.08  CALCIUM 8.7 8.5   Recent Results (from the past 240 hour(s))  URINE CULTURE     Status: None   Collection Time    02/02/13 12:23 PM      Result Value Range  Status   Specimen Description URINE, CATHETERIZED   Final   Special Requests ADDED [id number]   Final   Culture  Setup Time 02/02/2013 22:37   Final   Colony Count NO GROWTH   Final   Culture NO GROWTH   Final   Report Status 02/03/2013 FINAL   Final  URINE CULTURE     Status: None   Collection Time    02/03/13  6:08 PM      Result Value Range Status   Specimen Description URINE, CATHETERIZED   Final   Special Requests NONE   Final   Culture  Setup Time 02/03/2013 20:04   Final   Colony Count NO GROWTH   Final   Culture NO GROWTH   Final   Report Status 02/05/2013 FINAL   Final  CULTURE, ROUTINE-ABSCESS     Status: None   Collection Time    02/04/13 12:11 PM      Result Value Range Status   Specimen Description ABSCESS   Final   Special Requests LEFT GLUTEAL PT ON ZOSYN   Final   Gram Stain     Final   Value: FEW WBC PRESENT,BOTH PMN AND MONONUCLEAR     NO SQUAMOUS EPITHELIAL CELLS SEEN     MODERATE GRAM POSITIVE COCCI     IN PAIRS   Culture  Final   Value: FEW MICROAEROPHILIC STREPTOCOCCI     Note: Standardized susceptibility testing for this organism is not available.   Report Status 02/07/2013 FINAL   Final  ANAEROBIC CULTURE     Status: None   Collection Time    02/04/13 12:11 PM      Result Value Range Status   Specimen Description ABSCESS   Final   Special Requests LEFT GLUTEAL PT ON ZOSYN   Final   Gram Stain     Final   Value: FEW WBC PRESENT,BOTH PMN AND MONONUCLEAR     NO SQUAMOUS EPITHELIAL CELLS SEEN     MODERATE GRAM POSITIVE COCCI     IN PAIRS   Culture NO ANAEROBES ISOLATED   Final   Report Status 02/09/2013 FINAL   Final  MRSA PCR SCREENING     Status: None   Collection Time    02/08/13 11:15 PM      Result Value Range Status   MRSA by PCR NEGATIVE  NEGATIVE Final   Comment:            The GeneXpert MRSA Assay (FDA     approved for NASAL specimens     only), is one component of a     comprehensive MRSA colonization     surveillance program.  It is not     intended to diagnose MRSA     infection nor to guide or     monitor treatment for     MRSA infections.     Studies/Results: No results found.  Medications: Scheduled Meds: . allopurinol  100 mg Oral Daily  . amLODipine  5 mg Oral Daily  . cilostazol  100 mg Oral BID  . darifenacin  7.5 mg Oral Daily  . feeding supplement  237 mL Oral Q24H  . feeding supplement  30 mL Oral TID WC  . HYDROmorphone      . insulin aspart  0-20 Units Subcutaneous TID WC  . insulin glargine  10 Units Subcutaneous QHS  . levothyroxine  150 mcg Oral QAC breakfast  . multivitamin with minerals  1 tablet Oral Daily  . pantoprazole  40 mg Oral Daily  . piperacillin-tazobactam (ZOSYN)  IV  3.375 g Intravenous Q8H  . polyethylene glycol  17 g Oral BID  . senna-docusate  1 tablet Oral BID   Continuous Infusions: . sodium chloride 100 mL/hr at 02/12/13 0641   PRN Meds:.acetaminophen, albuterol, fluticasone, HYDROmorphone (DILAUDID) injection, ondansetron (ZOFRAN) IV, oxyCODONE, sodium chloride    LOS: 10 days   Sedric Guia,CHRISTOPHER  [facility] Pager [phone]. If 8PM-8AM, please contact night-coverage at [email or web address], password [facility] 02/12/2013, 11:54 AM  LOS: 10 days

## 2013-02-12 NOTE — Progress Notes (Signed)
Cancel OT visit, pt off floor for surgical procedure. Will re attempt tx tomorrow  Mosetta Putt, OT 02/12/13

## 2013-02-12 NOTE — Preoperative (Signed)
Beta Blockers   Reason not to administer Beta Blockers:Not Applicable 

## 2013-02-12 NOTE — Anesthesia Preprocedure Evaluation (Signed)
Anesthesia Evaluation  Patient identified by MRN, date of birth, ID band Patient awake    Reviewed: Allergy & Precautions, H&P , NPO status , Patient's Chart, lab work & pertinent test results, reviewed documented beta blocker date and time   Airway Mallampati: II TM Distance: >3 FB Neck ROM: full    Dental   Pulmonary neg pulmonary ROS,  breath sounds clear to auscultation        Cardiovascular hypertension, On Medications + Peripheral Vascular Disease Rhythm:regular     Neuro/Psych negative neurological ROS  negative psych ROS   GI/Hepatic Neg liver ROS, GERD-  Medicated and Controlled,  Endo/Other  diabetesMorbid obesity  Renal/GU Renal disease  negative genitourinary   Musculoskeletal   Abdominal   Peds  Hematology negative hematology ROS (+)   Anesthesia Other Findings See surgeon's H&P   Reproductive/Obstetrics negative OB ROS                           Anesthesia Physical Anesthesia Plan  ASA: III  Anesthesia Plan: General   Post-op Pain Management:    Induction: Intravenous  Airway Management Planned: Oral ETT  Additional Equipment:   Intra-op Plan:   Post-operative Plan: Extubation in OR  Informed Consent: I have reviewed the patients History and Physical, chart, labs and discussed the procedure including the risks, benefits and alternatives for the proposed anesthesia with the patient or authorized representative who has indicated his/her understanding and acceptance.   Dental Advisory Given  Plan Discussed with: CRNA and Surgeon  Anesthesia Plan Comments:         Anesthesia Quick Evaluation

## 2013-02-12 NOTE — Progress Notes (Signed)
Pt said she felt better today. She explained her surgery this morning and how scared she was and how painful it was for her. She explained to staff and said they apologized. She said she started praying and felt better. Pt was glad for visit and prayer and wishes for continued visits. Ernest Haber Chaplain  02/12/13 1600  Clinical Encounter Type  Visited With Patient

## 2013-02-12 NOTE — Anesthesia Postprocedure Evaluation (Signed)
Anesthesia Post Note  Patient: [name] [name]  Procedure(s) Performed: Procedure(s) (LRB): INCISION AND DEBRIDEMENT BUTTOCK WOUND  (N/A)  Anesthesia type: General  Patient location: PACU  Post pain: Pain level controlled  Post assessment: Patient's Cardiovascular Status Stable  Last Vitals:  Filed Vitals:   02/12/13 1120  BP: 138/63  Pulse: 97  Temp:   Resp: 14    Post vital signs: Reviewed and stable  Level of consciousness: alert  Complications: No apparent anesthesia complications

## 2013-02-12 NOTE — Transfer of Care (Signed)
Immediate Anesthesia Transfer of Care Note  Patient: [name] [name]  Procedure(s) Performed: Procedure(s): INCISION AND DEBRIDEMENT BUTTOCK WOUND  (N/A)  Patient Location: PACU  Anesthesia Type:General  Level of Consciousness: awake, alert , oriented and sedated  Airway & Oxygen Therapy: Patient Spontanous Breathing and Patient connected to nasal cannula oxygen  Post-op Assessment: Report given to PACU RN, Post -op Vital signs reviewed and stable and Patient moving all extremities  Post vital signs: Reviewed and stable  Complications: No apparent anesthesia complications

## 2013-02-12 NOTE — Progress Notes (Signed)
Physical Therapy Treatment Patient Details Name: [name] [name] MRN: [id number] DOB: 09-04-1951 Today's Date: 02/12/2013 Time: [id number] PT Time Calculation (min): 28 min  PT Assessment / Plan / Recommendation Comments on Treatment Session  Progressing slowly, but steady despite return to OR for I and D.    Follow Up Recommendations  Home health PT;SNF;Other (comment) (leaning more toward SNF given family's ability to support he)     Does the patient have the potential to tolerate intense rehabilitation     Barriers to Discharge        Equipment Recommendations  Rolling walker with 5" wheels;Other (comment)    Recommendations for Other Services    Frequency Min 3X/week   Plan Discharge plan remains appropriate;Frequency remains appropriate    Precautions / Restrictions Precautions Precautions: Fall Restrictions Weight Bearing Restrictions: No   Pertinent Vitals/Pain     Mobility  Bed Mobility Bed Mobility: Rolling Right;Rolling Left;Right Sidelying to Sit;Sitting - Scoot to Edge of Bed;Sit to Sidelying Right Rolling Right: 6: Modified independent (Device/Increase time) Rolling Left: 6: Modified independent (Device/Increase time) Right Sidelying to Sit: 4: Min guard;With rails Sitting - Scoot to Edge of Bed: 5: Supervision Sit to Sidelying Right: 4: Min assist Details for Bed Mobility Assistance: min guard to min for safety Transfers Transfers: Sit to Stand;Stand to Sit Sit to Stand: 4: Min assist;From bed Stand to Sit: 4: Min assist;To bed Details for Transfer Assistance: vc's for hand placement Ambulation/Gait Ambulation/Gait Assistance: 4: Min guard;4: Min assist Ambulation Distance (Feet): 14 Feet (total with standing rests on elbows) Assistive device: Rolling walker Ambulation/Gait Assistance Details: effortful, but steady Gait Pattern: Step-to pattern;Decreased step length - right;Decreased step length - left Stairs: No Wheelchair Mobility Wheelchair  Mobility: No    Exercises     PT Diagnosis:    PT Problem List:   PT Treatment Interventions:     PT Goals Acute Rehab PT Goals Time For Goal Achievement: 02/24/13 Potential to Achieve Goals: Good Pt will go Supine/Side to Sit: with modified independence;with HOB 0 degrees PT Goal: Supine/Side to Sit - Progress: Progressing toward goal Pt will go Sit to Stand: with modified independence PT Goal: Sit to Stand - Progress: Progressing toward goal Pt will Transfer Bed to Chair/Chair to Bed: with modified independence PT Transfer Goal: Bed to Chair/Chair to Bed - Progress: Progressing toward goal Pt will Ambulate: 16 - 50 feet;with modified independence;with least restrictive assistive device PT Goal: Ambulate - Progress: Progressing toward goal Pt will Go Up / Down Stairs: 1-2 stairs;with supervision;with rail(s);Other (comment) PT Goal: Up/Down Stairs - Progress: Not met  Visit Information  Last PT Received On: 02/12/13 Assistance Needed: +1    Subjective Data      Cognition  Cognition Arousal/Alertness: Awake/alert Behavior During Therapy: WFL for tasks assessed/performed Overall Cognitive Status: Within Functional Limits for tasks assessed    Balance     End of Session PT - End of Session Activity Tolerance: Patient tolerated treatment well Patient left: in bed;with call bell/phone within reach Nurse Communication: Mobility status   GP     [name] [name], [name] 02/12/2013, 6:43 PM 02/12/2013  [name], PT [phone]  (pager)

## 2013-02-12 NOTE — Progress Notes (Signed)
Occupational Therapy Treatment Patient Details Name: [name] [name] MRN: [id number] DOB: 08-24-1951 Today's Date: 02/12/2013 Time: [id number] OT Time Calculation (min): 28 min  OT Assessment / Plan / Recommendation Comments on Treatment Session Basic functional mobility requiring increased effort and time today.   Making progress slowly.  Feel that pt will benefit from [facility] to further progress rehab before eventually returning home.    Follow Up Recommendations  SNF    Barriers to Discharge       Equipment Recommendations  3 in 1 bedside comode (bariatric)    Recommendations for Other Services    Frequency Min 2X/week   Plan Discharge plan needs to be updated    Precautions / Restrictions     Pertinent Vitals/Pain See vitals    ADL  Lower Body Dressing: Performed;Maximal assistance Where Assessed - Lower Body Dressing: Supported sit to stand Toilet Transfer: Simulated;Minimal assistance Toilet Transfer Method: Sit to stand Toilet Transfer Equipment:  (bed) Equipment Used: Rolling walker Transfers/Ambulation Related to ADLs: Min assist and multiple rest breaks ADL Comments: Pt requiring increased time and effort during all aspects of functinoal mobility and demon'ing poor activity tolerance.      OT Diagnosis:    OT Problem List:   OT Treatment Interventions:     OT Goals ADL Goals Pt Will Transfer to Toilet: with supervision;with DME;Grab bars ADL Goal: Toilet Transfer - Progress: Progressing toward goals Miscellaneous OT Goals Miscellaneous OT Goal #1: Pt will tolerate >15 min of therepeutic activity at sup level with 1 standing rest break. OT Goal: Miscellaneous Goal #1 - Progress: Goal set today  Visit Information  Last OT Received On: 02/12/13    Subjective Data      Prior Functioning       Cognition  Cognition Arousal/Alertness: Awake/alert Behavior During Therapy: WFL for tasks assessed/performed Overall Cognitive Status: Within Functional Limits for  tasks assessed    Mobility  Bed Mobility Bed Mobility: Rolling Right;Rolling Left;Right Sidelying to Sit;Sitting - Scoot to Edge of Bed;Sit to Sidelying Right Rolling Right: 6: Modified independent (Device/Increase time) Rolling Left: 6: Modified independent (Device/Increase time) Right Sidelying to Sit: 4: Min guard;With rails Sitting - Scoot to Edge of Bed: 5: Supervision Sit to Sidelying Right: 4: Min assist Transfers Transfers: Sit to Stand;Stand to Sit Sit to Stand: 4: Min assist;From bed Stand to Sit: 4: Min assist;To bed    Exercises      Balance     End of Session OT - End of Session Equipment Utilized During Treatment:  (RW) Activity Tolerance: Patient limited by fatigue Patient left: in bed;with call bell/phone within reach;with family/visitor present  GO   02/12/2013 Darrol Jump OTR/L Pager [phone] Office [phone]   Darrol Jump 02/12/2013, 6:36 PM

## 2013-02-13 ENCOUNTER — Encounter (HOSPITAL_COMMUNITY): Payer: Self-pay | Admitting: Surgery

## 2013-02-13 DIAGNOSIS — F17200 Nicotine dependence, unspecified, uncomplicated: Secondary | ICD-10-CM

## 2013-02-13 LAB — CBC
HCT: 24.8 % — ABNORMAL LOW (ref 36.0–46.0)
Hemoglobin: 7.9 g/dL — ABNORMAL LOW (ref 12.0–15.0)
MCH: 26.4 pg (ref 26.0–34.0)
MCHC: 31.9 g/dL (ref 30.0–36.0)
MCV: 82.9 fL (ref 78.0–100.0)
Platelets: 314 10*3/uL (ref 150–400)
RBC: 2.99 MIL/uL — ABNORMAL LOW (ref 3.87–5.11)
RDW: 16.8 % — ABNORMAL HIGH (ref 11.5–15.5)
WBC: 16.7 10*3/uL — ABNORMAL HIGH (ref 4.0–10.5)

## 2013-02-13 LAB — BASIC METABOLIC PANEL
BUN: 10 mg/dL (ref 6–23)
CO2: 29 mEq/L (ref 19–32)
Calcium: 9 mg/dL (ref 8.4–10.5)
Chloride: 104 mEq/L (ref 96–112)
Creatinine, Ser: 0.91 mg/dL (ref 0.50–1.10)
GFR calc Af Amer: 77 mL/min — ABNORMAL LOW (ref >90)
GFR calc non Af Amer: 66 mL/min — ABNORMAL LOW (ref >90)
Glucose, Bld: 180 mg/dL — ABNORMAL HIGH (ref 70–99)
Potassium: 4.2 mEq/L (ref 3.5–5.1)
Sodium: 139 mEq/L (ref 135–145)

## 2013-02-13 LAB — GLUCOSE, CAPILLARY
Glucose-Capillary: 172 mg/dL — ABNORMAL HIGH (ref 70–99)
Glucose-Capillary: 192 mg/dL — ABNORMAL HIGH (ref 70–99)
Glucose-Capillary: 205 mg/dL — ABNORMAL HIGH (ref 70–99)
Glucose-Capillary: 164 mg/dL — ABNORMAL HIGH (ref 70–99)

## 2013-02-13 MED ORDER — IPRATROPIUM BROMIDE 0.02 % IN SOLN
0.5000 mg | Freq: Three times a day (TID) | RESPIRATORY_TRACT | Status: DC
  Administered 2013-02-13 – 2013-02-20 (×20): 0.5 mg via RESPIRATORY_TRACT
  Filled 2013-02-13 (×22): qty 2.5

## 2013-02-13 MED ORDER — INSULIN ASPART 100 UNIT/ML ~~LOC~~ SOLN
8.0000 [IU] | Freq: Three times a day (TID) | SUBCUTANEOUS | Status: DC
  Administered 2013-02-13 – 2013-02-18 (×11): 8 [IU] via SUBCUTANEOUS
  Administered 2013-02-18: 6 [IU] via SUBCUTANEOUS
  Administered 2013-02-19 – 2013-02-20 (×5): 8 [IU] via SUBCUTANEOUS

## 2013-02-13 MED ORDER — GUAIFENESIN ER 600 MG PO TB12
1200.0000 mg | ORAL_TABLET | Freq: Two times a day (BID) | ORAL | Status: DC
  Administered 2013-02-13 – 2013-02-20 (×14): 1200 mg via ORAL
  Filled 2013-02-13 (×5): qty 2
  Filled 2013-02-13: qty 1
  Filled 2013-02-13 (×11): qty 2

## 2013-02-13 MED ORDER — ALBUTEROL SULFATE (5 MG/ML) 0.5% IN NEBU
2.5000 mg | INHALATION_SOLUTION | Freq: Three times a day (TID) | RESPIRATORY_TRACT | Status: DC
  Administered 2013-02-13 – 2013-02-20 (×20): 2.5 mg via RESPIRATORY_TRACT
  Filled 2013-02-13 (×19): qty 0.5

## 2013-02-13 MED ORDER — INSULIN ASPART 100 UNIT/ML ~~LOC~~ SOLN: 8.0000 [IU] | Freq: Three times a day (TID) | SUBCUTANEOUS | Status: DC

## 2013-02-13 NOTE — Progress Notes (Signed)
MD made aware of the patients perineal dressing stained with stool. Staff tried to clean up but packing is not to be taken out.

## 2013-02-13 NOTE — Progress Notes (Signed)
OT Cancellation Note  Patient Details Name: [name] [name] MRN: [id number] DOB: May 21, 1951   Cancelled Treatment:    Reason Eval/Treat Not Completed: Pain limiting ability to participate. Pt reports 8/10 pain in surgical area, fatigue after BM and hygiene/pericare by staff. Pt stated that she will participate tomorrow  Britt Bottom 02/13/2013, 3:16 PM

## 2013-02-13 NOTE — Progress Notes (Signed)
0150 Patient had a small loose stool small amt on packing area clean with normal saline and outer dressing changed.

## 2013-02-13 NOTE — Progress Notes (Signed)
1 Day Post-Op  Subjective: Pt resting in bed upon entering the room. Complains of significant pain around the incision site on the buttock as well as L groin pain, which she says began yesterday after having a sheet putted off of her too quickly.  Tolerating her diet well, urinating per Foley, ambulating per PT/OT and stated she had a small BM last night (which is not on record). Pt also complains of dyspnea and cough.    Objective: Vital signs in last 24 hours: Temp:  [98 F (36.7 C)-98.8 F (37.1 C)] 98.8 F (37.1 C) (05/09 0512) Pulse Rate:  [96-110] 109 (05/09 0512) Resp:  [10-22] 18 (05/09 0512) BP: (138-203)/(63-97) 157/74 mmHg (05/09 0512) SpO2:  [95 %-100 %] 95 % (05/09 0512) Weight:  [158.5 kg (349 lb 6.9 oz)] 158.5 kg (349 lb 6.9 oz) (05/09 0512) Last BM Date: 02/02/13  Intake/Output from previous day: 05/08 0701 - 05/09 0700 In: 3003.3 [P.O.:690; I.V.:2213.3; IV Piggyback:100] Out: 1850 [Urine:1850] Intake/Output this shift:    PE: Gen:  Alert, NAD, pleasant Card:  RRR, no M/G/R heard Pulm:  Bilateral anterior expiratory wheezing in upper lobes Skin: L buttock incision intact intermittent packing with gauze dressing with pale yellow, nonpurulent drainage; very TTP around the incision site; slightly indurated around tape of dressing with no fluctuance; unable to visualize the 5 Penrose drains due to patient discomfort/pain  Lab Results:   Recent Labs  02/12/13 0450 02/13/13 0500  WBC 16.6* 16.7*  HGB 7.6* 7.9*  HCT 23.3* 24.8*  PLT 321 314   BMET  Recent Labs  02/11/13 0500 02/13/13 0500  NA 138 139  K 3.9 4.2  CL 108 104  CO2 25 29  GLUCOSE 211* 180*  BUN 14 10  CREATININE 1.08 0.91  CALCIUM 8.5 9.0   PT/INR No results found for this basename: LABPROT, INR,  in the last 72 hours CMP     Component Value Date/Time   NA 139 02/13/2013 0500   K 4.2 02/13/2013 0500   CL 104 02/13/2013 0500   CO2 29 02/13/2013 0500   GLUCOSE 180* 02/13/2013 0500   BUN 10  02/13/2013 0500   CREATININE 0.91 02/13/2013 0500   CALCIUM 9.0 02/13/2013 0500   PROT 5.8* 02/03/2013 0515   ALBUMIN 1.6* 02/11/2013 0500   AST 36 02/03/2013 0515   ALT 37* 02/03/2013 0515   ALKPHOS 122* 02/03/2013 0515   BILITOT 1.0 02/03/2013 0515   GFRNONAA 66* 02/13/2013 0500   GFRAA 77* 02/13/2013 0500   Lipase     Component Value Date/Time   LIPASE 17 02/02/2013 0850       Studies/Results: No results found.  Anti-infectives: Anti-infectives   Start     Dose/Rate Route Frequency Ordered Stop   02/03/13 1500  linezolid (ZYVOX) IVPB 600 mg  Status:  Discontinued     600 mg 300 mL/hr over 60 Minutes Intravenous Every 12 hours 02/03/13 1353 02/09/13 1056   02/03/13 1400  vancomycin (VANCOCIN) 1,750 mg in sodium chloride 0.9 % 500 mL IVPB  Status:  Discontinued     1,750 mg 250 mL/hr over 120 Minutes Intravenous Every 24 hours 02/02/13 1356 02/03/13 1443   02/02/13 1730  piperacillin-tazobactam (ZOSYN) IVPB 3.375 g     3.375 g 12.5 mL/hr over 240 Minutes Intravenous Every 8 hours 02/02/13 1646     02/02/13 1400  vancomycin (VANCOCIN) 2,000 mg in sodium chloride 0.9 % 500 mL IVPB     2,000 mg 250 mL/hr over 120  Minutes Intravenous  Once 02/02/13 1355 02/02/13 1701       Assessment/Plan 1. Perirectal and gluteal necrotizing soft tissue infection and abscess; S/P multiple irrigation and debridement with placement of 5 Penrose drains  - Culture grew microaerophilic streptococcus; cont abx therapy - Will go back to OR tomorrow for further debridement.  Will make NPO after midnight, obtain consent, hold heparin. - Cont PT/OT, SCDs, pain medication   2. Dyspnea/Cough - Pt states she has a PMH significant for asthma and exhibits dyspnea and expiratory wheezing on PE.  Will add IS to the treatment plan for this patient, as she is high risk for atelectasis or PNA.   - Cont albuterol neb treatment PRN     LOS: 11 days    Donah Driver 02/13/2013, 8:15 AM [phone]

## 2013-02-13 NOTE — Progress Notes (Signed)
Plan OR for dressing change/ debridement tomorrow.  [name]. Georgette Dover, MD, [facility] Surgery  General/ Trauma Surgery  02/13/2013 11:27 AM

## 2013-02-13 NOTE — Progress Notes (Signed)
[facility] PROGRESS NOTE  [name] [name] [id number] DOB: 06/30/1951 DOA: 02/02/2013 PCP: [name], MD  Assessment/Plan: Principal Problem:   Sepsis Active Problems:   DM (diabetes mellitus)   CKD (chronic kidney disease), stage III   Cellulitis   Morbid obesity   ARF (acute renal failure)   Diarrhea   Hyponatremia   Hypokalemia   Cellulitis and abscess of buttock    1. Sepsis/Perirectal and gluteal necrotizing abscess: Patient presented with a boil of her L buttock/perineal region for 1 week which had progressively increased in size and was very painful, associatyed with chills. In the ER she was noted to be tachycardic with HR 130s, BP was low at 73/49 (improved with IVF to low 100s), WBC was 23, consistent with SIRS/Sepsis. She was initially managed with [name] [name], then Zyvox was substituted for vancomycin, due to developing ARF. Dr [name] provided surgical consultation, and patient is s/p I&D in OR per Gen Surg 02/04/13, 02/06/13 , 02/08/13, and 02/10/13. Culture grew microaerophilic strept. Clinically, sepsis has resolved, and Zyvox was discontinued on 02/09/13. Now only on Zosyn day #10. [name] is trending down, patient is afebrile, and feels considerably better. Patient is s/p irrigation and debridement of left perirectal, peroneal, and gluteal regions with placement of Penrose drains by Dr Georgette Dover on 02/12/13, and return to OR is planned for 02/14/13.  2. DM: Patient has known insulin-requiring DM-2. Managing currently with Lantus/SSI, as patient has had poor appetite, and there was concern for the possiblity of hypoglycemia with 70/30, which has been discontinued. CBCs are reasonably, but sub-optimally controlled at this time. Have added meal cover on 02/12/13, adjusting as indicated.  3. ARF on CKD stage III: Unfortunately, baseline creatinine is unknown. Patient had a creatinine of 1.77 at presentation, but this climbed to a maximum of 2.79 by 02/05/13, likely due to a  combination of prerenal and ATN (contrast-induced nephropathy). Dr Katrine Coho provided nephrology consultation. Managed as recommended, and as of 02/08/13, creatinine had normalized at 1.20. As of 02/10/13, creatinine was 1.01.  4. Abnormal fluid and calcium collection in pelvis (noted via CT scan): This was an incidental finding, of unclear etiology. MRI of 02/06/13, showed interval resolution of intra pelvic fluid collection.  5. Gout: Asymptomatic. Continued on Allopurinol.  6. Hypertension: Controlled on Norvasc, started on 02/08/13.  7. Constipation: Likely due to  Opioid analgesics. Managing with Miralax and Docusate. KUB pattern suggestive of ileum. Patient tolerating meals.  7. Diarrhea: Transient/Resolved.  8. Hyponatremia: Likely related to her acute renal failure as well as volume depletion/Resolved.  9. Hypokalemia: Repleted as indicated/Resolved  10. Cough: Patient has a mild cough, productive of occasionally brownish phlegm. Chest is clear. Suspect repeated GA/oral airway, contributory. Will add mucinex. Surgical team has commenced incentive spirometry.    Code Status: Full Code.  Family Communication:  Disposition Plan: To be determined.    Brief narrative: 62 y.o. female with PMH of DM, Obesity, HTN, CKD 3, who presented with c/o diarrhea and a gluteal boil. She c/o diarrhea for ~5 days, non bloodly and non bilious, associated with lower abdominal cramps, with h/o recent antibiotic use 2-3 weeks prior for UTI. In addition she noted a boil of her L buttock /perineal region for 1 week which had progressively increased in size and was very painful. She denied any drainage from it. She denied any fevers, but did report chills. In the ER she was noted to be tachycardic with HR 130s, BP was low at 73/49 (improved with IVF  to low 100s), WBC of 23K and electrolyte abormalities.  Since admission the patient has been treated with IV fluid resuscitation and empiric antibiotics. General surgery  was consulted and the patient has been taken to the operating room multiple times for I and D of her gluteal abscess. The patient stabilized and was able to be transferred to the surgical floor on 02/05/2013.    Consultants: Nephrology.  General  Surgery   Procedures:  See notes.   Antibiotics:  Zosyn.   HPI/Subjective: No new issues.   Objective: Vital signs in last 24 hours: Temp:  [98.5 F (36.9 C)-98.8 F (37.1 C)] 98.8 F (37.1 C) (05/09 0512) Pulse Rate:  [109-110] 109 (05/09 0512) Resp:  [16-18] 18 (05/09 0512) BP: (157-162)/(74-87) 157/74 mmHg (05/09 0512) SpO2:  [95 %-100 %] 95 % (05/09 0512) Weight:  [158.5 kg (349 lb 6.9 oz)] 158.5 kg (349 lb 6.9 oz) (05/09 0512) Weight change:  Last BM Date: 02/02/13  Intake/Output from previous day: 05/08 0701 - 05/09 0700 In: 3003.3 [P.O.:690; I.V.:2213.3; IV Piggyback:100] Out: 1850 [Urine:1850]     Physical Exam: General: Comfortable, alert, communicative, fully oriented, not short of breath at rest.  HEENT:  Moderate clinical pallor, no jaundice, no conjunctival injection or discharge. NECK:  Supple, JVP not seen, no carotid bruits, no palpable lymphadenopathy, no palpable goiter. CHEST:  Clinically clear to auscultation, no wheezes, no crackles. HEART:  Sounds 1 and 2 heard, normal, regular, no murmurs. ABDOMEN:  Morbidly obese, soft, non-tender, no palpable organomegaly, no palpable masses, normal bowel sounds. GENITALIA:  Not examined.  BUTTOCKS: Not examined today. LOWER EXTREMITIES:  No pitting edema, palpable peripheral pulses. MUSCULOSKELETAL SYSTEM:  Generalized osteoarthritic changes, otherwise, normal. CENTRAL NERVOUS SYSTEM:  No focal neurologic deficit on gross examination.  Lab Results:  Recent Labs  02/12/13 0450 02/13/13 0500  WBC 16.6* 16.7*  HGB 7.6* 7.9*  HCT 23.3* 24.8*  PLT 321 314    Recent Labs  02/11/13 0500 02/13/13 0500  NA 138 139  K 3.9 4.2  CL 108 104  CO2 25 29   GLUCOSE 211* 180*  BUN 14 10  CREATININE 1.08 0.91  CALCIUM 8.5 9.0   Recent Results (from the past 240 hour(s))  URINE CULTURE     Status: None   Collection Time    02/03/13  6:08 PM      Result Value Range Status   Specimen Description URINE, CATHETERIZED   Final   Special Requests NONE   Final   Culture  Setup Time 02/03/2013 20:04   Final   Colony Count NO GROWTH   Final   Culture NO GROWTH   Final   Report Status 02/05/2013 FINAL   Final  CULTURE, ROUTINE-ABSCESS     Status: None   Collection Time    02/04/13 12:11 PM      Result Value Range Status   Specimen Description ABSCESS   Final   Special Requests LEFT GLUTEAL PT ON ZOSYN   Final   Gram Stain     Final   Value: FEW WBC PRESENT,BOTH PMN AND MONONUCLEAR     NO SQUAMOUS EPITHELIAL CELLS SEEN     MODERATE GRAM POSITIVE COCCI     IN PAIRS   Culture     Final   Value: FEW MICROAEROPHILIC STREPTOCOCCI     Note: Standardized susceptibility testing for this organism is not available.   Report Status 02/07/2013 FINAL   Final  ANAEROBIC CULTURE     Status: None  Collection Time    02/04/13 12:11 PM      Result Value Range Status   Specimen Description ABSCESS   Final   Special Requests LEFT GLUTEAL PT ON ZOSYN   Final   Gram Stain     Final   Value: FEW WBC PRESENT,BOTH PMN AND MONONUCLEAR     NO SQUAMOUS EPITHELIAL CELLS SEEN     MODERATE GRAM POSITIVE COCCI     IN PAIRS   Culture NO ANAEROBES ISOLATED   Final   Report Status 02/09/2013 FINAL   Final  MRSA PCR SCREENING     Status: None   Collection Time    02/08/13 11:15 PM      Result Value Range Status   MRSA by PCR NEGATIVE  NEGATIVE Final   Comment:            The GeneXpert MRSA Assay (FDA     approved for NASAL specimens     only), is one component of a     comprehensive MRSA colonization     surveillance program. It is not     intended to diagnose MRSA     infection nor to guide or     monitor treatment for     MRSA infections.      Studies/Results: No results found.  Medications: Scheduled Meds: . allopurinol  100 mg Oral Daily  . amLODipine  5 mg Oral Daily  . cilostazol  100 mg Oral BID  . darifenacin  7.5 mg Oral Daily  . feeding supplement  237 mL Oral Q24H  . feeding supplement  30 mL Oral TID WC  . insulin aspart  0-20 Units Subcutaneous TID WC  . insulin aspart  4 Units Subcutaneous TID WC  . insulin glargine  10 Units Subcutaneous QHS  . levothyroxine  150 mcg Oral QAC breakfast  . multivitamin with minerals  1 tablet Oral Daily  . pantoprazole  40 mg Oral Daily  . piperacillin-tazobactam (ZOSYN)  IV  3.375 g Intravenous Q8H  . polyethylene glycol  17 g Oral BID  . senna-docusate  1 tablet Oral BID   Continuous Infusions: . sodium chloride 100 mL/hr at 02/12/13 0641   PRN Meds:.acetaminophen, albuterol, fluticasone, HYDROmorphone (DILAUDID) injection, ondansetron (ZOFRAN) IV, oxyCODONE, sodium chloride    LOS: 11 days   Celina Shiley,CHRISTOPHER  [facility] Pager [phone]. If 8PM-8AM, please contact night-coverage at [email or web address], password [facility] 02/13/2013, 12:17 PM  LOS: 11 days

## 2013-02-13 NOTE — Progress Notes (Signed)
Pt's husband was bedside today and they were about to have lunch. Pt informed me she will have surgery again tomorrow. I told her I will be praying for her and will follow-up on Monday. Brief visit since she was about to have lunch. She was thankful for visit. Ernest Haber Chaplain  02/13/13 1200  Clinical Encounter Type  Visited With Patient and family together

## 2013-02-14 ENCOUNTER — Encounter (HOSPITAL_COMMUNITY)
Admission: EM | Disposition: A | Discharge status: Skilled Nursing Facility | Payer: Self-pay | Source: Home / Self Care | Attending: Internal Medicine

## 2013-02-14 ENCOUNTER — Encounter (HOSPITAL_COMMUNITY): Payer: Self-pay | Admitting: Certified Registered"

## 2013-02-14 ENCOUNTER — Inpatient Hospital Stay (HOSPITAL_COMMUNITY): Payer: PRIVATE HEALTH INSURANCE | Admitting: Certified Registered"

## 2013-02-14 DIAGNOSIS — I10 Essential (primary) hypertension: Secondary | ICD-10-CM

## 2013-02-14 DIAGNOSIS — D62 Acute posthemorrhagic anemia: Secondary | ICD-10-CM

## 2013-02-14 HISTORY — PX: INCISION AND DRAINAGE ABSCESS: SHX5864

## 2013-02-14 LAB — CBC
HCT: 24.5 % — ABNORMAL LOW (ref 36.0–46.0)
Hemoglobin: 7.7 g/dL — ABNORMAL LOW (ref 12.0–15.0)
MCH: 26.1 pg (ref 26.0–34.0)
MCHC: 31.4 g/dL (ref 30.0–36.0)
MCV: 83.1 fL (ref 78.0–100.0)
Platelets: 347 10*3/uL (ref 150–400)
RBC: 2.95 MIL/uL — ABNORMAL LOW (ref 3.87–5.11)
RDW: 17.4 % — ABNORMAL HIGH (ref 11.5–15.5)
WBC: 16 10*3/uL — ABNORMAL HIGH (ref 4.0–10.5)

## 2013-02-14 LAB — BASIC METABOLIC PANEL
BUN: 9 mg/dL (ref 6–23)
CO2: 30 mEq/L (ref 19–32)
Calcium: 8.3 mg/dL — ABNORMAL LOW (ref 8.4–10.5)
Chloride: 103 mEq/L (ref 96–112)
Creatinine, Ser: 0.93 mg/dL (ref 0.50–1.10)
GFR calc Af Amer: 75 mL/min — ABNORMAL LOW (ref >90)
GFR calc non Af Amer: 65 mL/min — ABNORMAL LOW (ref >90)
Glucose, Bld: 173 mg/dL — ABNORMAL HIGH (ref 70–99)
Potassium: 4 mEq/L (ref 3.5–5.1)
Sodium: 138 mEq/L (ref 135–145)

## 2013-02-14 LAB — PREPARE RBC (CROSSMATCH)

## 2013-02-14 LAB — GLUCOSE, CAPILLARY
Glucose-Capillary: 165 mg/dL — ABNORMAL HIGH (ref 70–99)
Glucose-Capillary: 226 mg/dL — ABNORMAL HIGH (ref 70–99)
Glucose-Capillary: 244 mg/dL — ABNORMAL HIGH (ref 70–99)
Glucose-Capillary: 217 mg/dL — ABNORMAL HIGH (ref 70–99)

## 2013-02-14 SURGERY — INCISION AND DRAINAGE, ABSCESS
Anesthesia: General | Site: Buttocks | Wound class: Dirty or Infected

## 2013-02-14 MED ORDER — LIDOCAINE HCL 4 % MT SOLN
OROMUCOSAL | Status: DC | PRN
  Administered 2013-02-14: 4 mL via TOPICAL

## 2013-02-14 MED ORDER — AMLODIPINE BESYLATE 10 MG PO TABS
10.0000 mg | ORAL_TABLET | Freq: Every day | ORAL | Status: DC
  Administered 2013-02-15 – 2013-02-20 (×6): 10 mg via ORAL
  Filled 2013-02-14 (×6): qty 1

## 2013-02-14 MED ORDER — HYDROMORPHONE HCL PF 1 MG/ML IJ SOLN
0.2500 mg | INTRAMUSCULAR | Status: DC | PRN
  Administered 2013-02-14 (×2): 0.5 mg via INTRAVENOUS

## 2013-02-14 MED ORDER — ALBUTEROL SULFATE HFA 108 (90 BASE) MCG/ACT IN AERS
INHALATION_SPRAY | RESPIRATORY_TRACT | Status: DC | PRN
  Administered 2013-02-14: 2 via RESPIRATORY_TRACT

## 2013-02-14 MED ORDER — PROPOFOL 10 MG/ML IV BOLUS
INTRAVENOUS | Status: DC | PRN
  Administered 2013-02-14: 200 mg via INTRAVENOUS

## 2013-02-14 MED ORDER — LIDOCAINE HCL (CARDIAC) 20 MG/ML IV SOLN
INTRAVENOUS | Status: DC | PRN
  Administered 2013-02-14: 100 mg via INTRAVENOUS

## 2013-02-14 MED ORDER — SUCCINYLCHOLINE CHLORIDE 20 MG/ML IJ SOLN
INTRAMUSCULAR | Status: DC | PRN
  Administered 2013-02-14: 160 mg via INTRAVENOUS

## 2013-02-14 MED ORDER — 0.9 % SODIUM CHLORIDE (POUR BTL) OPTIME
TOPICAL | Status: DC | PRN
  Administered 2013-02-14: 1000 mL

## 2013-02-14 MED ORDER — FUROSEMIDE 10 MG/ML IJ SOLN
20.0000 mg | Freq: Once | INTRAMUSCULAR | Status: AC
  Administered 2013-02-14: 20 mg via INTRAVENOUS
  Filled 2013-02-14 (×2): qty 2

## 2013-02-14 MED ORDER — INSULIN GLARGINE 100 UNIT/ML ~~LOC~~ SOLN
15.0000 [IU] | Freq: Every day | SUBCUTANEOUS | Status: DC
  Administered 2013-02-14: 15 [IU] via SUBCUTANEOUS
  Filled 2013-02-14 (×2): qty 0.15

## 2013-02-14 MED ORDER — OXYCODONE HCL 5 MG/5ML PO SOLN: 5.0000 mg | Freq: Once | ORAL | Status: DC | PRN

## 2013-02-14 MED ORDER — METOCLOPRAMIDE HCL 5 MG/ML IJ SOLN: 10.0000 mg | Freq: Once | INTRAMUSCULAR | Status: DC | PRN

## 2013-02-14 MED ORDER — HYDRALAZINE HCL 25 MG PO TABS
25.0000 mg | ORAL_TABLET | Freq: Three times a day (TID) | ORAL | Status: DC
  Administered 2013-02-14 – 2013-02-20 (×18): 25 mg via ORAL
  Filled 2013-02-14 (×24): qty 1

## 2013-02-14 MED ORDER — OXYCODONE HCL 5 MG PO TABS: 5.0000 mg | ORAL_TABLET | Freq: Once | ORAL | Status: DC | PRN

## 2013-02-14 MED ORDER — FENTANYL CITRATE 0.05 MG/ML IJ SOLN
INTRAMUSCULAR | Status: DC | PRN
  Administered 2013-02-14: 150 ug via INTRAVENOUS

## 2013-02-14 SURGICAL SUPPLY — 25 items
BANDAGE GAUZE ELAST BULKY 4 IN (GAUZE/BANDAGES/DRESSINGS) ×2 IMPLANT
CANISTER SUCTION 2500CC (MISCELLANEOUS) ×2 IMPLANT
CLOTH BEACON ORANGE TIMEOUT ST (SAFETY) ×2 IMPLANT
COVER SURGICAL LIGHT HANDLE (MISCELLANEOUS) ×2 IMPLANT
DRAPE LAPAROSCOPIC ABDOMINAL (DRAPES) ×2 IMPLANT
DRAPE UTILITY 15X26 W/TAPE STR (DRAPE) ×4 IMPLANT
DRSG PAD ABDOMINAL 8X10 ST (GAUZE/BANDAGES/DRESSINGS) ×2 IMPLANT
ELECT CAUTERY BLADE 6.4 (BLADE) ×2 IMPLANT
ELECT REM PT RETURN 9FT ADLT (ELECTROSURGICAL) ×2
ELECTRODE REM PT RTRN 9FT ADLT (ELECTROSURGICAL) ×1 IMPLANT
GLOVE SURG SIGNA 7.5 PF LTX (GLOVE) ×2 IMPLANT
GOWN PREVENTION PLUS XLARGE (GOWN DISPOSABLE) ×2 IMPLANT
GOWN STRL NON-REIN LRG LVL3 (GOWN DISPOSABLE) ×2 IMPLANT
KIT BASIN OR (CUSTOM PROCEDURE TRAY) ×2 IMPLANT
KIT ROOM TURNOVER OR (KITS) ×2 IMPLANT
NS IRRIG 1000ML POUR BTL (IV SOLUTION) ×2 IMPLANT
PACK GENERAL/GYN (CUSTOM PROCEDURE TRAY) ×2 IMPLANT
PAD ARMBOARD 7.5X6 YLW CONV (MISCELLANEOUS) ×4 IMPLANT
SPECIMEN JAR SMALL (MISCELLANEOUS) IMPLANT
SPONGE GAUZE 4X4 12PLY (GAUZE/BANDAGES/DRESSINGS) ×2 IMPLANT
SWAB COLLECTION DEVICE MRSA (MISCELLANEOUS) IMPLANT
TAPE CLOTH SURG 6X10 WHT LF (GAUZE/BANDAGES/DRESSINGS) ×2 IMPLANT
TOWEL OR 17X24 6PK STRL BLUE (TOWEL DISPOSABLE) ×2 IMPLANT
TOWEL OR 17X26 10 PK STRL BLUE (TOWEL DISPOSABLE) ×2 IMPLANT
TUBE ANAEROBIC SPECIMEN COL (MISCELLANEOUS) IMPLANT

## 2013-02-14 NOTE — Transfer of Care (Signed)
Immediate Anesthesia Transfer of Care Note  Patient: [name] [name]  Procedure(s) Performed: Procedure(s): INCISION AND DRAINAGE/DRESSING CHANGE (N/A)  Patient Location: PACU  Anesthesia Type:General  Level of Consciousness: awake and alert   Airway & Oxygen Therapy: Patient on BIPAP mask with 50% O2  Post-op Assessment: Report given to PACU RN, Post -op Vital signs reviewed and stable and Patient moving all extremities  Post vital signs: Reviewed and stable  Complications: No apparent anesthesia complications

## 2013-02-14 NOTE — Preoperative (Signed)
Beta Blockers   Reason not to administer Beta Blockers:Not Applicable 

## 2013-02-14 NOTE — Anesthesia Procedure Notes (Signed)
Procedure Name: Intubation Date/Time: 02/14/2013 9:06 AM Performed by: Melina Copa, Talasia Saulter R Pre-anesthesia Checklist: Patient identified, Emergency Drugs available, Suction available, Patient being monitored and Timeout performed Patient Re-evaluated:Patient Re-evaluated prior to inductionOxygen Delivery Method: Circle system utilized Preoxygenation: Pre-oxygenation with 100% oxygen Intubation Type: IV induction and Rapid sequence Laryngoscope Size: Mac and 3 Grade View: Grade I Tube type: Oral Tube size: 7.5 mm Number of attempts: 1 Airway Equipment and Method: Stylet Placement Confirmation: ETT inserted through vocal cords under direct vision,  positive ETCO2 and breath sounds checked- equal and bilateral Secured at: 23 cm Tube secured with: Tape Dental Injury: Teeth and Oropharynx as per pre-operative assessment

## 2013-02-14 NOTE — Progress Notes (Signed)
Called to PACU to set up BIPAP. Pt being bagged at time of arrival. Pt placed on NIV/PC 16/8 RR 15 100%. Pt appears much more comfortable on BIPAP. HR is 118, SpO2 99%, diminished bs bilaterally. RT will continue to monitor.

## 2013-02-14 NOTE — Anesthesia Postprocedure Evaluation (Signed)
Anesthesia Post Note  Patient: [name] [name]  Procedure(s) Performed: Procedure(s) (LRB): INCISION AND DRAINAGE/DRESSING CHANGE (N/A)  Anesthesia type: General  Patient location: PACU  Post pain: Pain level controlled  Post assessment: Patient's Cardiovascular Status Stable  Last Vitals:  Filed Vitals:   02/14/13 1115  BP: 189/97  Pulse: 110  Temp:   Resp: 15    Post vital signs: Reviewed and stable  Level of consciousness: alert  Complications: No apparent anesthesia complications   Patient to be placed on CPAP per current home orders and compliance education for the patient initiated.

## 2013-02-14 NOTE — Progress Notes (Signed)
[facility] PROGRESS NOTE  [name] [name] [id number] DOB: 1951/02/25 DOA: 02/02/2013 PCP: [name], MD  Assessment/Plan: Principal Problem:   Sepsis Active Problems:   DM (diabetes mellitus)   CKD (chronic kidney disease), stage III   Cellulitis   Morbid obesity   ARF (acute renal failure)   Diarrhea   Hyponatremia   Hypokalemia   Cellulitis and abscess of buttock    1. Sepsis/Perirectal and gluteal necrotizing abscess: Patient presented with a boil of her L buttock/perineal region for 1 week which had progressively increased in size and was very painful, associatyed with chills. In the ER she was noted to be tachycardic with HR 130s, BP was low at 73/49 (improved with IVF to low 100s), WBC was 23, consistent with SIRS/Sepsis. She was initially managed with [name] [name], then Zyvox was substituted for vancomycin, due to developing ARF. Dr [name] provided surgical consultation, and patient is s/p I&D in OR per Gen Surg 02/04/13, 02/06/13 , 02/08/13, and 02/10/13. Culture grew microaerophilic strept. Clinically, sepsis has resolved, and Zyvox was discontinued on 02/09/13. Now only on Zosyn day #10. [place] is trending down, patient is afebrile, and feels considerably better. Patient is s/p irrigation and debridement of left perirectal, peroneal, and gluteal regions with placement of Penrose drains by Dr Georgette Dover on 02/12/13, and dressings in have been done on 02/14/13.  2. DM: Patient has known insulin-requiring DM-2. Managing currently with Lantus/SSI, as patient has had poor appetite, and there was concern for the possiblity of hypoglycemia with 70/30, which has been discontinued. CBCs have improved, but are still sub-optimally controlled at this time. Have added meal cover on 02/12/13, adjusting Lantus as indicated.  3. ARF on CKD stage III: Unfortunately, baseline creatinine is unknown. Patient had a creatinine of 1.77 at presentation, but this climbed to a maximum of 2.79 by  02/05/13, likely due to a combination of prerenal and ATN (contrast-induced nephropathy). Dr Katrine Coho provided nephrology consultation. Managed as recommended, and as of 02/08/13, creatinine had normalized at 1.20. As of 02/10/13, creatinine was 1.01.  4. Abnormal fluid and calcium collection in pelvis (noted via CT scan): This was an incidental finding, of unclear etiology. MRI of 02/06/13, showed interval resolution of intra pelvic fluid collection.  5. Gout: Asymptomatic. Continued on Allopurinol.  6. Hypertension: Uncontrolled Managing with Norvasc, started on 02/08/13. Have added Hydralazine today.  7. Constipation: Likely due to opioid analgesics. Managing with Miralax and Docusate. KUB pattern suggestive of ileum. Patient tolerating meals.  7. Diarrhea: Transient/Resolved.  8. Hyponatremia: Likely related to her acute renal failure as well as volume depletion/Resolved.  9. Hypokalemia: Repleted as indicated/Resolved  10. Cough: Patient has a mild cough, productive of occasionally brownish phlegm. Chest is clear. Suspect repeated GA/oral airway, contributory. Added Mucinex on 59/14. Surgical team has commenced incentive spirometry.  11. Anemia: HB at presentation was 11.4, but has drifted down during the course of hospitalization, presumably due to ABLA from repeaated surgeries. Today, HB is 7.7 Will transfuse 1 unit PRBC and follow CBC.    Code Status: Full Code.  Family Communication:  Disposition Plan: To be determined.    Brief narrative: 62 y.o. female with PMH of DM, Obesity, HTN, CKD 3, who presented with c/o diarrhea and a gluteal boil. She c/o diarrhea for ~5 days, non bloodly and non bilious, associated with lower abdominal cramps, with h/o recent antibiotic use 2-3 weeks prior for UTI. In addition she noted a boil of her L buttock /perineal region for 1 week  which had progressively increased in size and was very painful. She denied any drainage from it. She denied any fevers, but  did report chills. In the ER she was noted to be tachycardic with HR 130s, BP was low at 73/49 (improved with IVF to low 100s), WBC of 23K and electrolyte abormalities.  Since admission the patient has been treated with IV fluid resuscitation and empiric antibiotics. General surgery was consulted and the patient has been taken to the operating room multiple times for I and D of her gluteal abscess. The patient stabilized and was able to be transferred to the surgical floor on 02/05/2013.    Consultants: Nephrology.  General  Surgery   Procedures:  See notes.   Antibiotics:  Zosyn.   HPI/Subjective: Feels "weak".   Objective: Vital signs in last 24 hours: Temp:  [97.5 F (36.4 C)-99.5 F (37.5 C)] 98.5 F (36.9 C) (05/10 1240) Pulse Rate:  [106-133] 116 (05/10 1240) Resp:  [8-88] 15 (05/10 1240) BP: (154-213)/(66-129) 161/86 mmHg (05/10 1240) SpO2:  [1 %-100 %] 100 % (05/10 1243) FiO2 (%):  [30 %-100 %] 30 % (05/10 1130) Weight change:  Last BM Date: 02/14/13  Intake/Output from previous day: 05/09 0701 - 05/10 0700 In: 240 [P.O.:240] Out: 4025 [Urine:4025] Total I/O In: 545 [I.V.:545] Out: -    Physical Exam: General: Comfortable, alert, communicative, fully oriented, not short of breath at rest.  HEENT:  Moderate-marked clinical pallor, no jaundice, no conjunctival injection or discharge. NECK:  Supple, JVP not seen, no carotid bruits, no palpable lymphadenopathy, no palpable goiter. CHEST:  Clinically clear to auscultation, no wheezes, no crackles. HEART:  Sounds 1 and 2 heard, normal, regular, no murmurs. ABDOMEN:  Morbidly obese, soft, non-tender, no palpable organomegaly, no palpable masses, normal bowel sounds. GENITALIA:  Not examined.  BUTTOCKS: Not examined today. LOWER EXTREMITIES:  No pitting edema, palpable peripheral pulses. MUSCULOSKELETAL SYSTEM:  Generalized osteoarthritic changes, otherwise, normal. CENTRAL NERVOUS SYSTEM:  No focal neurologic  deficit on gross examination.  Lab Results:  Recent Labs  02/13/13 0500 02/14/13 0436  WBC 16.7* 16.0*  HGB 7.9* 7.7*  HCT 24.8* 24.5*  PLT 314 347    Recent Labs  02/13/13 0500 02/14/13 0436  NA 139 138  K 4.2 4.0  CL 104 103  CO2 29 30  GLUCOSE 180* 173*  BUN 10 9  CREATININE 0.91 0.93  CALCIUM 9.0 8.3*   Recent Results (from the past 240 hour(s))  MRSA PCR SCREENING     Status: None   Collection Time    02/08/13 11:15 PM      Result Value Range Status   MRSA by PCR NEGATIVE  NEGATIVE Final   Comment:            The GeneXpert MRSA Assay (FDA     approved for NASAL specimens     only), is one component of a     comprehensive MRSA colonization     surveillance program. It is not     intended to diagnose MRSA     infection nor to guide or     monitor treatment for     MRSA infections.     Studies/Results: No results found.  Medications: Scheduled Meds: . ipratropium  0.5 mg Nebulization TID   And  . albuterol  2.5 mg Nebulization TID  . allopurinol  100 mg Oral Daily  . [START ON 02/15/2013] amLODipine  10 mg Oral Daily  . cilostazol  100 mg Oral BID  .  darifenacin  7.5 mg Oral Daily  . feeding supplement  237 mL Oral Q24H  . feeding supplement  30 mL Oral TID WC  . furosemide  20 mg Intravenous Once  . guaiFENesin  1,200 mg Oral BID  . insulin aspart  0-20 Units Subcutaneous TID WC  . insulin aspart  8 Units Subcutaneous TID WC  . insulin glargine  15 Units Subcutaneous QHS  . levothyroxine  150 mcg Oral QAC breakfast  . multivitamin with minerals  1 tablet Oral Daily  . pantoprazole  40 mg Oral Daily  . piperacillin-tazobactam (ZOSYN)  IV  3.375 g Intravenous Q8H  . polyethylene glycol  17 g Oral BID  . senna-docusate  1 tablet Oral BID   Continuous Infusions: . sodium chloride 100 mL/hr at 02/14/13 0045   PRN Meds:.acetaminophen, albuterol, fluticasone, HYDROmorphone (DILAUDID) injection, ondansetron (ZOFRAN) IV, oxyCODONE, sodium  chloride    LOS: 12 days   Rishika Mccollom,CHRISTOPHER  [facility] Pager [phone]. If 8PM-8AM, please contact night-coverage at [email or web address], password [facility] 02/14/2013, 2:31 PM  LOS: 12 days

## 2013-02-14 NOTE — Op Note (Signed)
INCISION AND DRAINAGE/DRESSING CHANGE  Procedure Note  [name] [name] 02/02/2013 - 02/14/2013   Pre-op Diagnosis: dressing change     Post-op Diagnosis: sam3  Procedure(s): INCISION AND DRAINAGE/DRESSING CHANGE  Surgeon(s): Harl Bowie, MD  Anesthesia: General  Staff:  Circulator: Normajean Glasgow, RN Scrub Person: Lorenza Chick, CST  Estimated Blood Loss: Minimal               Indications: This is a 62 year old female with a left buttock/thigh wound status post incision and drainage of abscess who now presents for dressing change under anesthesia  Procedure: She was brought to the operating room and identified as the correct patient. She was placed supine on the operating room table Gen. Anesthesia was induced. The patient was placed in the lithotomy position. The packing was removed from her wound as well as the Penrose drains. The wound was mostly clean with some stool in it. I irrigated the wound extensively with saline. There was no further necrotic tissue or purulence. I placed the Penrose drains back in their locations and packed the wound with a wet-to-dry saline Curlex. Dry gauze and ABDs were then applied. She tolerated the procedure well.  all counts were correct at the end of the procedure.  She was then extubated and taken in a separate condition to the recovery room.          [name]   Date: 02/14/2013  Time: 9:22 AM

## 2013-02-14 NOTE — Progress Notes (Signed)
2 Days Post-Op  Subjective: Having some abdominal pain  Objective: Vital signs in last 24 hours: Temp:  [98.2 F (36.8 C)-99.5 F (37.5 C)] 98.2 F (36.8 C) (05/10 0636) Pulse Rate:  [110-124] 112 (05/10 0636) Resp:  [18-20] 18 (05/10 0636) BP: (147-169)/(66-86) 161/86 mmHg (05/10 0636) SpO2:  [95 %-97 %] 95 % (05/10 0636) Last BM Date: 02/13/13  Intake/Output from previous day: 05/09 0701 - 05/10 0700 In: 240 [P.O.:240] Out: 4025 [Urine:4025] Intake/Output this shift:   Abdomen soft Wounds with packing  Lab Results:   Recent Labs  02/13/13 0500 02/14/13 0436  WBC 16.7* 16.0*  HGB 7.9* 7.7*  HCT 24.8* 24.5*  PLT 314 347   BMET  Recent Labs  02/13/13 0500 02/14/13 0436  NA 139 138  K 4.2 4.0  CL 104 103  CO2 29 30  GLUCOSE 180* 173*  BUN 10 9  CREATININE 0.91 0.93  CALCIUM 9.0 8.3*   PT/INR No results found for this basename: LABPROT, INR,  in the last 72 hours ABG No results found for this basename: PHART, PCO2, PO2, HCO3,  in the last 72 hours  Studies/Results: No results found.  Anti-infectives: Anti-infectives   Start     Dose/Rate Route Frequency Ordered Stop   02/03/13 1500  linezolid (ZYVOX) IVPB 600 mg  Status:  Discontinued     600 mg 300 mL/hr over 60 Minutes Intravenous Every 12 hours 02/03/13 1353 02/09/13 1056   02/03/13 1400  vancomycin (VANCOCIN) 1,750 mg in sodium chloride 0.9 % 500 mL IVPB  Status:  Discontinued     1,750 mg 250 mL/hr over 120 Minutes Intravenous Every 24 hours 02/02/13 1356 02/03/13 1443   02/02/13 1730  piperacillin-tazobactam (ZOSYN) IVPB 3.375 g     3.375 g 12.5 mL/hr over 240 Minutes Intravenous Every 8 hours 02/02/13 1646     02/02/13 1400  vancomycin (VANCOCIN) 2,000 mg in sodium chloride 0.9 % 500 mL IVPB     2,000 mg 250 mL/hr over 120 Minutes Intravenous  Once 02/02/13 1355 02/02/13 1701      Assessment/Plan: s/p Procedure(s): INCISION AND DEBRIDEMENT BUTTOCK WOUND  (N/A)  To OR today for  dressing change.  I discussed this with her in detail.  She agrees to proceed.  LOS: 12 days    Shahrzad Koble A 02/14/2013

## 2013-02-14 NOTE — Anesthesia Preprocedure Evaluation (Signed)
Anesthesia Evaluation  Patient identified by MRN, date of birth, ID band Patient awake    Reviewed: Allergy & Precautions, H&P , NPO status , Patient's Chart, lab work & pertinent test results, reviewed documented beta blocker date and time   Airway Mallampati: II TM Distance: >3 FB Neck ROM: full    Dental   Pulmonary neg pulmonary ROS,  breath sounds clear to auscultation        Cardiovascular hypertension, On Medications + Peripheral Vascular Disease Rhythm:regular     Neuro/Psych negative neurological ROS  negative psych ROS   GI/Hepatic Neg liver ROS, GERD-  Medicated and Controlled,  Endo/Other  diabetesMorbid obesity  Renal/GU CRFRenal disease  negative genitourinary   Musculoskeletal   Abdominal   Peds  Hematology negative hematology ROS (+)   Anesthesia Other Findings See surgeon's H&P   Reproductive/Obstetrics negative OB ROS                           Anesthesia Physical Anesthesia Plan  ASA: III  Anesthesia Plan: General   Post-op Pain Management:    Induction: Intravenous  Airway Management Planned: Oral ETT  Additional Equipment:   Intra-op Plan:   Post-operative Plan: Extubation in OR  Informed Consent: I have reviewed the patients History and Physical, chart, labs and discussed the procedure including the risks, benefits and alternatives for the proposed anesthesia with the patient or authorized representative who has indicated his/her understanding and acceptance.   Dental Advisory Given  Plan Discussed with: CRNA and Surgeon  Anesthesia Plan Comments:         Anesthesia Quick Evaluation

## 2013-02-14 NOTE — Progress Notes (Signed)
Received back from PACU via bed.  Respiratory in to give treatment.  O2/2L via nasal cannula.

## 2013-02-15 LAB — PREPARE RBC (CROSSMATCH)

## 2013-02-15 LAB — BASIC METABOLIC PANEL
BUN: 11 mg/dL (ref 6–23)
CO2: 29 mEq/L (ref 19–32)
Calcium: 8.6 mg/dL (ref 8.4–10.5)
Chloride: 101 mEq/L (ref 96–112)
Creatinine, Ser: 0.99 mg/dL (ref 0.50–1.10)
GFR calc Af Amer: 69 mL/min — ABNORMAL LOW (ref >90)
GFR calc non Af Amer: 60 mL/min — ABNORMAL LOW (ref >90)
Glucose, Bld: 168 mg/dL — ABNORMAL HIGH (ref 70–99)
Potassium: 4 mEq/L (ref 3.5–5.1)
Sodium: 137 mEq/L (ref 135–145)

## 2013-02-15 LAB — CBC
HCT: 24.6 % — ABNORMAL LOW (ref 36.0–46.0)
Hemoglobin: 8 g/dL — ABNORMAL LOW (ref 12.0–15.0)
MCH: 27.3 pg (ref 26.0–34.0)
MCHC: 32.5 g/dL (ref 30.0–36.0)
MCV: 84 fL (ref 78.0–100.0)
Platelets: 344 10*3/uL (ref 150–400)
RBC: 2.93 MIL/uL — ABNORMAL LOW (ref 3.87–5.11)
RDW: 17.6 % — ABNORMAL HIGH (ref 11.5–15.5)
WBC: 12.7 10*3/uL — ABNORMAL HIGH (ref 4.0–10.5)

## 2013-02-15 LAB — TYPE AND SCREEN
ABO/RH(D): O POS
Antibody Screen: NEGATIVE
Unit division: 0

## 2013-02-15 LAB — GLUCOSE, CAPILLARY
Glucose-Capillary: 191 mg/dL — ABNORMAL HIGH (ref 70–99)
Glucose-Capillary: 209 mg/dL — ABNORMAL HIGH (ref 70–99)
Glucose-Capillary: 220 mg/dL — ABNORMAL HIGH (ref 70–99)
Glucose-Capillary: 210 mg/dL — ABNORMAL HIGH (ref 70–99)

## 2013-02-15 MED ORDER — HEPARIN SODIUM (PORCINE) 5000 UNIT/ML IJ SOLN
5000.0000 [IU] | Freq: Three times a day (TID) | INTRAMUSCULAR | Status: AC
  Administered 2013-02-15 (×3): 5000 [IU] via SUBCUTANEOUS
  Filled 2013-02-15 (×2): qty 1

## 2013-02-15 MED ORDER — INSULIN GLARGINE 100 UNIT/ML ~~LOC~~ SOLN
20.0000 [IU] | Freq: Every day | SUBCUTANEOUS | Status: DC
  Administered 2013-02-15 – 2013-02-16 (×2): 20 [IU] via SUBCUTANEOUS
  Filled 2013-02-15 (×3): qty 0.2

## 2013-02-15 MED ORDER — FUROSEMIDE 10 MG/ML IJ SOLN
20.0000 mg | Freq: Once | INTRAMUSCULAR | Status: AC
  Administered 2013-02-15: 20 mg via INTRAVENOUS
  Filled 2013-02-15: qty 2

## 2013-02-15 NOTE — Progress Notes (Signed)
1 Day Post-Op  Subjective: Pt feeling much better.  Starting to have BM's, urinating well.  Pain much less.  She is also in good spirits.    Objective: Vital signs in last 24 hours: Temp:  [97.5 F (36.4 C)-98.7 F (37.1 C)] 98.4 F (36.9 C) (05/11 0614) Pulse Rate:  [105-133] 114 (05/11 0614) Resp:  [8-88] 18 (05/11 0614) BP: (135-213)/(53-129) 169/78 mmHg (05/11 0614) SpO2:  [1 %-100 %] 97 % (05/11 0614) FiO2 (%):  [30 %-100 %] 30 % (05/10 1130) Last BM Date: 02/14/13  Intake/Output from previous day: 05/10 0701 - 05/11 0700 In: 1538 [I.V.:1538] Out: 2500 [Urine:2500] Intake/Output this shift:    PE: Gen:  Alert, NAD, pleasant Skin:  Buttock/groin dressings in place  Lab Results:   Recent Labs  02/13/13 0500 02/14/13 0436  WBC 16.7* 16.0*  HGB 7.9* 7.7*  HCT 24.8* 24.5*  PLT 314 347   BMET  Recent Labs  02/13/13 0500 02/14/13 0436  NA 139 138  K 4.2 4.0  CL 104 103  CO2 29 30  GLUCOSE 180* 173*  BUN 10 9  CREATININE 0.91 0.93  CALCIUM 9.0 8.3*   PT/INR No results found for this basename: LABPROT, INR,  in the last 72 hours CMP     Component Value Date/Time   NA 138 02/14/2013 0436   K 4.0 02/14/2013 0436   CL 103 02/14/2013 0436   CO2 30 02/14/2013 0436   GLUCOSE 173* 02/14/2013 0436   BUN 9 02/14/2013 0436   CREATININE 0.93 02/14/2013 0436   CALCIUM 8.3* 02/14/2013 0436   PROT 5.8* 02/03/2013 0515   ALBUMIN 1.6* 02/11/2013 0500   AST 36 02/03/2013 0515   ALT 37* 02/03/2013 0515   ALKPHOS 122* 02/03/2013 0515   BILITOT 1.0 02/03/2013 0515   GFRNONAA 65* 02/14/2013 0436   GFRAA 75* 02/14/2013 0436   Lipase     Component Value Date/Time   LIPASE 17 02/02/2013 0850       Studies/Results: No results found.  Anti-infectives: Anti-infectives   Start     Dose/Rate Route Frequency Ordered Stop   02/03/13 1500  linezolid (ZYVOX) IVPB 600 mg  Status:  Discontinued     600 mg 300 mL/hr over 60 Minutes Intravenous Every 12 hours 02/03/13 1353 02/09/13  1056   02/03/13 1400  vancomycin (VANCOCIN) 1,750 mg in sodium chloride 0.9 % 500 mL IVPB  Status:  Discontinued     1,750 mg 250 mL/hr over 120 Minutes Intravenous Every 24 hours 02/02/13 1356 02/03/13 1443   02/02/13 1730  piperacillin-tazobactam (ZOSYN) IVPB 3.375 g     3.375 g 12.5 mL/hr over 240 Minutes Intravenous Every 8 hours 02/02/13 1646     02/02/13 1400  vancomycin (VANCOCIN) 2,000 mg in sodium chloride 0.9 % 500 mL IVPB     2,000 mg 250 mL/hr over 120 Minutes Intravenous  Once 02/02/13 1355 02/02/13 1701       Assessment/Plan 1. Perirectal and gluteal necrotizing soft tissue infection and abscess; S/P multiple irrigation and debridement with placement of 5 Penrose drains  - Culture grew microaerophilic streptococcus; cont abx therapy  - Will go back to OR tomorrow (02/16/13) for further debridement. Will make NPO after midnight, obtain consent, hold heparin.  - Cont PT/OT, SCDs, pain medication  - restart heparin and hold after tonight's dose - labs ordered for tomorrow - will need HH wound changes (wet to dry) BID to TID, can be discharged when tolerating dressing changes with PO alone,  now she's still needing trips to the OR for dressing changes due to pain      LOS: 13 days    [name] [name], [name] [name] 02/15/2013, 7:32 AM Pager: [phone]

## 2013-02-15 NOTE — Progress Notes (Signed)
[facility] PROGRESS NOTE  [name] [name] [id number] DOB: January 16, 1951 DOA: 02/02/2013 PCP: [name], MD  Assessment/Plan: Principal Problem:   Sepsis Active Problems:   DM (diabetes mellitus)   CKD (chronic kidney disease), stage III   Cellulitis   Morbid obesity   ARF (acute renal failure)   Diarrhea   Hyponatremia   Hypokalemia   Cellulitis and abscess of buttock   Acute blood loss anemia   HTN (hypertension)    1. Sepsis/Perirectal and gluteal necrotizing abscess: Patient presented with a boil of her L buttock/perineal region for 1 week which had progressively increased in size and was very painful, associatyed with chills. In the ER she was noted to be tachycardic with HR 130s, BP was low at 73/49 (improved with IVF to low 100s), WBC was 23, consistent with SIRS/Sepsis. She was initially managed with [name] [name], then Zyvox was substituted for vancomycin, due to developing ARF. Dr [name] provided surgical consultation, and patient is s/p I&D in OR per Gen Surg 02/04/13, 02/06/13 , 02/08/13, and 02/10/13. Culture grew microaerophilic strept. Clinically, sepsis has resolved, and Zyvox was discontinued on 02/09/13. Now only on Zosyn day #10. [place] is trending down, patient is afebrile, and feels considerably better. Patient is s/p irrigation and debridement of left perirectal, peroneal, and gluteal regions with placement of Penrose drains by Dr Georgette Dover on 02/12/13, and dressings in have been done on 02/14/13. Another OR session is planned for 02/16/13.  2. DM: Patient has known insulin-requiring DM-2. Managing currently with Lantus/SSI, as patient has had poor appetite, and there was concern for the possiblity of hypoglycemia with 70/30, which has been discontinued. CBCs have improved, added meal cover on 02/12/13, adjusting Lantus as indicated.  3. ARF on CKD stage III: Unfortunately, baseline creatinine is unknown. Patient had a creatinine of 1.77 at presentation, but this  climbed to a maximum of 2.79 by 02/05/13, likely due to a combination of prerenal and ATN (contrast-induced nephropathy). Dr Katrine Coho provided nephrology consultation. Managed as recommended, and as of 02/08/13, creatinine had normalized at 1.20. As of 02/10/13, creatinine was 1.01.  4. Abnormal fluid and calcium collection in pelvis (noted via CT scan): This was an incidental finding, of unclear etiology. MRI of 02/06/13, showed interval resolution of intra pelvic fluid collection.  5. Gout: Asymptomatic. Continued on Allopurinol.  6. Hypertension: Uncontrolled Managing with Norvasc, started on 02/08/13. Have added Hydralazine today.  7. Constipation: Likely due to opioid analgesics. Managing with Miralax and Docusate. KUB pattern suggestive of ileum. Patient tolerating meals.  7. Diarrhea: Transient/Resolved.  8. Hyponatremia: Likely related to her acute renal failure as well as volume depletion/Resolved.  9. Hypokalemia: Repleted as indicated/Resolved  10. Cough: Patient has a mild cough, productive of occasionally brownish phlegm. Chest is clear. Suspect repeated GA/oral airway, contributory. Added Mucinex on 59/14. Surgical team has commenced incentive spirometry.  11. Anemia: HB at presentation was 11.4, but has drifted down during the course of hospitalization, presumably due to ABLA from repeaated surgeries. On 02/14/13, HB was 7.7. Transfused 1 unit PRBC, with a bump in HB to 8.0 today. Will transfuse another unit PRBC today.    Code Status: Full Code.  Family Communication:  Disposition Plan: To be determined.    Brief narrative: 62 y.o. female with PMH of DM, Obesity, HTN, CKD 3, who presented with c/o diarrhea and a gluteal boil. She c/o diarrhea for ~5 days, non bloodly and non bilious, associated with lower abdominal cramps, with h/o recent antibiotic use 2-3  weeks prior for UTI. In addition she noted a boil of her L buttock /perineal region for 1 week which had progressively increased  in size and was very painful. She denied any drainage from it. She denied any fevers, but did report chills. In the ER she was noted to be tachycardic with HR 130s, BP was low at 73/49 (improved with IVF to low 100s), WBC of 23K and electrolyte abormalities.  Since admission the patient has been treated with IV fluid resuscitation and empiric antibiotics. General surgery was consulted and the patient has been taken to the operating room multiple times for I and D of her gluteal abscess. The patient stabilized and was able to be transferred to the surgical floor on 02/05/2013.    Consultants: Nephrology.  General  Surgery   Procedures:  See notes.   Antibiotics:  Zosyn.   HPI/Subjective: Stronger today. No new issues.   Objective: Vital signs in last 24 hours: Temp:  [98.4 F (36.9 C)-98.7 F (37.1 C)] 98.4 F (36.9 C) (05/11 0614) Pulse Rate:  [105-121] 114 (05/11 0614) Resp:  [18-22] 18 (05/11 0614) BP: (135-171)/(53-78) 169/78 mmHg (05/11 0614) SpO2:  [96 %-99 %] 97 % (05/11 [id number]) Weight change:  Last BM Date: 02/14/13  Intake/Output from previous day: 05/10 0701 - 05/11 0700 In: 1538 [I.V.:1538] Out: 2500 [Urine:2500] Total I/O In: -  Out: 400 [Urine:400]   Physical Exam: General: Comfortable, alert, communicative, fully oriented, not short of breath at rest.  HEENT:  Moderate-marked clinical pallor, no jaundice, no conjunctival injection or discharge. NECK:  Supple, JVP not seen, no carotid bruits, no palpable lymphadenopathy, no palpable goiter. CHEST:  Clinically clear to auscultation, no wheezes, no crackles. HEART:  Sounds 1 and 2 heard, normal, regular, no murmurs. ABDOMEN:  Morbidly obese, soft, non-tender, no palpable organomegaly, no palpable masses, normal bowel sounds. GENITALIA:  Not examined.  BUTTOCKS: Not examined today. LOWER EXTREMITIES:  No pitting edema, palpable peripheral pulses. MUSCULOSKELETAL SYSTEM:  Generalized osteoarthritic changes,  otherwise, normal. CENTRAL NERVOUS SYSTEM:  No focal neurologic deficit on gross examination.  Lab Results:  Recent Labs  02/14/13 0436 02/15/13 0500  WBC 16.0* 12.7*  HGB 7.7* 8.0*  HCT 24.5* 24.6*  PLT 347 344    Recent Labs  02/14/13 0436 02/15/13 0500  NA 138 137  K 4.0 4.0  CL 103 101  CO2 30 29  GLUCOSE 173* 168*  BUN 9 11  CREATININE 0.93 0.99  CALCIUM 8.3* 8.6   Recent Results (from the past 240 hour(s))  MRSA PCR SCREENING     Status: None   Collection Time    02/08/13 11:15 PM      Result Value Range Status   MRSA by PCR NEGATIVE  NEGATIVE Final   Comment:            The GeneXpert MRSA Assay (FDA     approved for NASAL specimens     only), is one component of a     comprehensive MRSA colonization     surveillance program. It is not     intended to diagnose MRSA     infection nor to guide or     monitor treatment for     MRSA infections.     Studies/Results: No results found.  Medications: Scheduled Meds: . ipratropium  0.5 mg Nebulization TID   And  . albuterol  2.5 mg Nebulization TID  . allopurinol  100 mg Oral Daily  . amLODipine  10 mg Oral  Daily  . cilostazol  100 mg Oral BID  . darifenacin  7.5 mg Oral Daily  . feeding supplement  237 mL Oral Q24H  . feeding supplement  30 mL Oral TID WC  . guaiFENesin  1,200 mg Oral BID  . heparin subcutaneous  5,000 Units Subcutaneous Q8H  . hydrALAZINE  25 mg Oral Q8H  . insulin aspart  0-20 Units Subcutaneous TID WC  . insulin aspart  8 Units Subcutaneous TID WC  . insulin glargine  15 Units Subcutaneous QHS  . levothyroxine  150 mcg Oral QAC breakfast  . multivitamin with minerals  1 tablet Oral Daily  . pantoprazole  40 mg Oral Daily  . piperacillin-tazobactam (ZOSYN)  IV  3.375 g Intravenous Q8H  . polyethylene glycol  17 g Oral BID  . senna-docusate  1 tablet Oral BID   Continuous Infusions: . sodium chloride 50 mL/hr at 02/15/13 0501   PRN Meds:.acetaminophen, albuterol, fluticasone,  HYDROmorphone (DILAUDID) injection, ondansetron (ZOFRAN) IV, oxyCODONE, sodium chloride    LOS: 13 days   Emily Massar,CHRISTOPHER  [facility] Pager [phone]. If 8PM-8AM, please contact night-coverage at [email or web address], password [facility] 02/15/2013, 12:52 PM  LOS: 13 days

## 2013-02-15 NOTE — Progress Notes (Signed)
Unable to wear with stuffy nose and head congestion. Explained to patient we could try again tomorrow night.

## 2013-02-15 NOTE — Progress Notes (Signed)
General surgery attending note:  Patient interviewed this morning. She is doing reasonably well and in good spirits, considering. No new problems. Wounds looked pretty clean yesterday. Question whether she needs any ongoing antibiotics. We'll continue until  wounds are examined tomorrow.   Edsel Petrin. Dalbert Batman, M.D., [facility] General and Minimally invasive Surgery Breast and Colorectal Surgery Office:   [phone] Pager:   [phone]

## 2013-02-16 ENCOUNTER — Encounter (HOSPITAL_COMMUNITY): Payer: Self-pay | Admitting: Certified Registered Nurse Anesthetist

## 2013-02-16 ENCOUNTER — Encounter (HOSPITAL_COMMUNITY)
Admission: EM | Disposition: A | Discharge status: Skilled Nursing Facility | Payer: Self-pay | Source: Home / Self Care | Attending: Internal Medicine

## 2013-02-16 ENCOUNTER — Inpatient Hospital Stay (HOSPITAL_COMMUNITY): Payer: PRIVATE HEALTH INSURANCE | Admitting: Certified Registered Nurse Anesthetist

## 2013-02-16 HISTORY — PX: INCISION AND DRAINAGE PERIRECTAL ABSCESS: SHX1804

## 2013-02-16 LAB — GLUCOSE, CAPILLARY
Glucose-Capillary: 155 mg/dL — ABNORMAL HIGH (ref 70–99)
Glucose-Capillary: 161 mg/dL — ABNORMAL HIGH (ref 70–99)
Glucose-Capillary: 178 mg/dL — ABNORMAL HIGH (ref 70–99)
Glucose-Capillary: 180 mg/dL — ABNORMAL HIGH (ref 70–99)
Glucose-Capillary: 265 mg/dL — ABNORMAL HIGH (ref 70–99)

## 2013-02-16 LAB — CBC
HCT: 27.4 % — ABNORMAL LOW (ref 36.0–46.0)
Hemoglobin: 8.8 g/dL — ABNORMAL LOW (ref 12.0–15.0)
MCH: 26.9 pg (ref 26.0–34.0)
MCHC: 32.1 g/dL (ref 30.0–36.0)
MCV: 83.8 fL (ref 78.0–100.0)
Platelets: 355 10*3/uL (ref 150–400)
RBC: 3.27 MIL/uL — ABNORMAL LOW (ref 3.87–5.11)
RDW: 18.1 % — ABNORMAL HIGH (ref 11.5–15.5)
WBC: 10.8 10*3/uL — ABNORMAL HIGH (ref 4.0–10.5)

## 2013-02-16 LAB — BASIC METABOLIC PANEL
BUN: 10 mg/dL (ref 6–23)
CO2: 32 mEq/L (ref 19–32)
Calcium: 8.6 mg/dL (ref 8.4–10.5)
Chloride: 101 mEq/L (ref 96–112)
Creatinine, Ser: 0.99 mg/dL (ref 0.50–1.10)
GFR calc Af Amer: 69 mL/min — ABNORMAL LOW (ref >90)
GFR calc non Af Amer: 60 mL/min — ABNORMAL LOW (ref >90)
Glucose, Bld: 185 mg/dL — ABNORMAL HIGH (ref 70–99)
Potassium: 4 mEq/L (ref 3.5–5.1)
Sodium: 137 mEq/L (ref 135–145)

## 2013-02-16 SURGERY — INCISION AND DRAINAGE, ABSCESS, PERIRECTAL
Anesthesia: General | Site: Perineum | Wound class: Dirty or Infected

## 2013-02-16 MED ORDER — LACTATED RINGERS IV SOLN
INTRAVENOUS | Status: DC | PRN
  Administered 2013-02-16: 13:00:00 via INTRAVENOUS

## 2013-02-16 MED ORDER — LIDOCAINE HCL (CARDIAC) 20 MG/ML IV SOLN
INTRAVENOUS | Status: DC | PRN
  Administered 2013-02-16: 90 mg via INTRAVENOUS

## 2013-02-16 MED ORDER — 0.9 % SODIUM CHLORIDE (POUR BTL) OPTIME
TOPICAL | Status: DC | PRN
  Administered 2013-02-16: 1000 mL

## 2013-02-16 MED ORDER — MIDAZOLAM HCL 5 MG/5ML IJ SOLN
INTRAMUSCULAR | Status: DC | PRN
  Administered 2013-02-16: 2 mg via INTRAVENOUS

## 2013-02-16 MED ORDER — LABETALOL HCL 5 MG/ML IV SOLN
INTRAVENOUS | Status: AC
  Filled 2013-02-16: qty 4

## 2013-02-16 MED ORDER — PHENYLEPHRINE HCL 10 MG/ML IJ SOLN
INTRAMUSCULAR | Status: DC | PRN
  Administered 2013-02-16: 80 ug via INTRAVENOUS
  Administered 2013-02-16: 120 ug via INTRAVENOUS

## 2013-02-16 MED ORDER — HYDROMORPHONE HCL PF 1 MG/ML IJ SOLN
0.2500 mg | INTRAMUSCULAR | Status: DC | PRN
  Administered 2013-02-16: 0.5 mg via INTRAVENOUS

## 2013-02-16 MED ORDER — SUCCINYLCHOLINE CHLORIDE 20 MG/ML IJ SOLN
INTRAMUSCULAR | Status: DC | PRN
  Administered 2013-02-16: 100 mg via INTRAVENOUS

## 2013-02-16 MED ORDER — LABETALOL HCL 5 MG/ML IV SOLN
10.0000 mg | INTRAVENOUS | Status: DC | PRN
  Administered 2013-02-16: 10 mg via INTRAVENOUS

## 2013-02-16 MED ORDER — ONDANSETRON HCL 4 MG/2ML IJ SOLN: 4.0000 mg | Freq: Once | INTRAMUSCULAR | Status: DC | PRN

## 2013-02-16 MED ORDER — HYDROMORPHONE HCL PF 1 MG/ML IJ SOLN
INTRAMUSCULAR | Status: AC
  Filled 2013-02-16: qty 1

## 2013-02-16 MED ORDER — PROPOFOL 10 MG/ML IV BOLUS
INTRAVENOUS | Status: DC | PRN
  Administered 2013-02-16: 300 mg via INTRAVENOUS

## 2013-02-16 MED ORDER — ONDANSETRON HCL 4 MG/2ML IJ SOLN
INTRAMUSCULAR | Status: DC | PRN
  Administered 2013-02-16: 4 mg via INTRAVENOUS

## 2013-02-16 MED ORDER — ARTIFICIAL TEARS OP OINT
TOPICAL_OINTMENT | OPHTHALMIC | Status: DC | PRN
  Administered 2013-02-16: 1 via OPHTHALMIC

## 2013-02-16 MED ORDER — FENTANYL CITRATE 0.05 MG/ML IJ SOLN
INTRAMUSCULAR | Status: DC | PRN
  Administered 2013-02-16: 100 ug via INTRAVENOUS
  Administered 2013-02-16: 150 ug via INTRAVENOUS

## 2013-02-16 SURGICAL SUPPLY — 38 items
BANDAGE GAUZE ELAST BULKY 4 IN (GAUZE/BANDAGES/DRESSINGS) ×2 IMPLANT
BLADE SURG 15 STRL LF DISP TIS (BLADE) ×1 IMPLANT
BLADE SURG 15 STRL SS (BLADE) ×1
CANISTER SUCTION 2500CC (MISCELLANEOUS) ×2 IMPLANT
CLEANER TIP ELECTROSURG 2X2 (MISCELLANEOUS) IMPLANT
CLOTH BEACON ORANGE TIMEOUT ST (SAFETY) ×2 IMPLANT
COVER SURGICAL LIGHT HANDLE (MISCELLANEOUS) ×2 IMPLANT
DRAPE UTILITY 15X26 W/TAPE STR (DRAPE) ×4 IMPLANT
DRSG PAD ABDOMINAL 8X10 ST (GAUZE/BANDAGES/DRESSINGS) ×4 IMPLANT
ELECT REM PT RETURN 9FT ADLT (ELECTROSURGICAL)
ELECTRODE REM PT RTRN 9FT ADLT (ELECTROSURGICAL) IMPLANT
GAUZE PACKING IODOFORM 1 (PACKING) IMPLANT
GAUZE SPONGE 4X4 16PLY XRAY LF (GAUZE/BANDAGES/DRESSINGS) ×2 IMPLANT
GLOVE BIO SURGEON STRL SZ8 (GLOVE) ×2 IMPLANT
GLOVE BIOGEL PI IND STRL 7.0 (GLOVE) ×1 IMPLANT
GLOVE BIOGEL PI IND STRL 8 (GLOVE) ×1 IMPLANT
GLOVE BIOGEL PI INDICATOR 7.0 (GLOVE) ×1
GLOVE BIOGEL PI INDICATOR 8 (GLOVE) ×1
GLOVE ECLIPSE 7.0 STRL STRAW (GLOVE) ×2 IMPLANT
GOWN PREVENTION PLUS XLARGE (GOWN DISPOSABLE) ×4 IMPLANT
GOWN STRL NON-REIN LRG LVL3 (GOWN DISPOSABLE) ×4 IMPLANT
KIT BASIN OR (CUSTOM PROCEDURE TRAY) ×2 IMPLANT
KIT ROOM TURNOVER OR (KITS) ×2 IMPLANT
NS IRRIG 1000ML POUR BTL (IV SOLUTION) ×2 IMPLANT
PACK LITHOTOMY IV (CUSTOM PROCEDURE TRAY) ×2 IMPLANT
PAD ARMBOARD 7.5X6 YLW CONV (MISCELLANEOUS) ×4 IMPLANT
PENCIL BUTTON HOLSTER BLD 10FT (ELECTRODE) ×2 IMPLANT
SPONGE GAUZE 4X4 12PLY (GAUZE/BANDAGES/DRESSINGS) IMPLANT
STAPLER VISISTAT 35W (STAPLE) ×2 IMPLANT
SWAB COLLECTION DEVICE MRSA (MISCELLANEOUS) IMPLANT
TAPE CLOTH SURG 6X10 WHT LF (GAUZE/BANDAGES/DRESSINGS) ×2 IMPLANT
TOWEL OR 17X24 6PK STRL BLUE (TOWEL DISPOSABLE) ×2 IMPLANT
TOWEL OR 17X26 10 PK STRL BLUE (TOWEL DISPOSABLE) ×2 IMPLANT
TUBE ANAEROBIC SPECIMEN COL (MISCELLANEOUS) IMPLANT
TUBE CONNECTING 12X1/4 (SUCTIONS) ×2 IMPLANT
UNDERPAD 30X30 INCONTINENT (UNDERPADS AND DIAPERS) ×2 IMPLANT
WATER STERILE IRR 1000ML POUR (IV SOLUTION) ×2 IMPLANT
YANKAUER SUCT BULB TIP NO VENT (SUCTIONS) ×2 IMPLANT

## 2013-02-16 NOTE — Op Note (Addendum)
02/02/2013 - 02/16/2013  2:16 PM  PATIENT:  [name] [name]  62 y.o. female  PRE-OPERATIVE DIAGNOSIS:  perineal abscess  POST-OPERATIVE DIAGNOSIS:  perineal abscess  PROCEDURE:  Procedure(s): IRRIGATION AND DEBRIDEMENT PERINEAL ABSCESS, DRESSING CHANGE UNDER ANESTHESIA  SURGEON:  Surgeon(s): Zenovia Jarred, MD  PHYSICIAN ASSISTANT:   ASSISTANTS: none   ANESTHESIA:   general  EBL:  Total I/O In: 0  Out: 1250 [Urine:1250]  BLOOD ADMINISTERED:none  DRAINS: Penrose drain in the L perineum/buttock - multiple drains remain in place   SPECIMEN:  No Specimen  DISPOSITION OF SPECIMEN:  N/A  COUNTS:  YES  DICTATION: .Dragon Dictation  Patient returns to the operating room for dressing change/incision and drainage abscess left buttock and perineal area. She is on antibiotic protocol. She was identified in the preop holding area. Her site was indicated. She is brought to the operating room and general  Anesthesia Was a minister by the anesthesia staff. She was placed in lithotomy position. Time out procedure was done. Packing was removed from her left perineal/buttock wound. Penrose drains remain. The entire area was prepped and draped in sterile fashion. The wound was closely examined. 80% clean granulation tissue. 20% fibrin slough. Wound was debrided mechanically with sponge cleaning most of the slough away. Next, pulse lavage with 3 L bag was then used to thoroughly irrigate the wound. No undrained collections. Tissue all viable. Penrose drains were placed into previous position. 1 Curlex saline soaked gauze was used for packing. Sterile dressing was placed. All counts were correct. Patient tolerated procedure without apparent complication was taken recovery in stable condition. Plan will be for bedside dressings from now on unless the wound worsens.  PATIENT DISPOSITION:  PACU - hemodynamically stable.   Delay start of Pharmacological VTE agent (>24hrs) due to surgical blood loss or  risk of bleeding:  no  Georganna Skeans, MD, MPH, FACS Pager: [phone]  5/12/20142:16 PM

## 2013-02-16 NOTE — Progress Notes (Signed)
[facility] PROGRESS NOTE  [name] [name] [id number] DOB: 30-Dec-1950 DOA: 02/02/2013 PCP: [name], MD  Assessment/Plan: Principal Problem:   Sepsis Active Problems:   DM (diabetes mellitus)   CKD (chronic kidney disease), stage III   Cellulitis   Morbid obesity   ARF (acute renal failure)   Diarrhea   Hyponatremia   Hypokalemia   Cellulitis and abscess of buttock   Acute blood loss anemia   HTN (hypertension)    1. Sepsis/Perirectal and gluteal necrotizing abscess: Patient presented with a boil of her L buttock/perineal region for 1 week which had progressively increased in size and was very painful, associatyed with chills. In the ER she was noted to be tachycardic with HR 130s, BP was low at 73/49 (improved with IVF to low 100s), WBC was 23, consistent with SIRS/Sepsis. She was initially managed with [name] [name], then Zyvox was substituted for vancomycin, due to developing ARF. Dr [name] provided surgical consultation, and patient is s/p I&D in OR per Gen Surg 02/04/13, 02/06/13 , 02/08/13, and 02/10/13. Culture grew microaerophilic strept. Clinically, sepsis has resolved, and Zyvox was discontinued on 02/09/13. Now only on Zosyn day #15. Herrick is trending down, patient is afebrile, and feels considerably better. Patient is s/p irrigation and debridement of left perirectal, peroneal, and gluteal regions with placement of Penrose drains by Dr Georgette Dover on 02/12/13, and dressings in have been done on 02/14/13. Another OR session is planned for 02/16/13.  2. DM: Patient has known insulin-requiring DM-2. Managing currently with Lantus/SSI, as patient has had poor appetite, and there was concern for the possiblity of hypoglycemia with 70/30, which has been discontinued. CBCs have improved, added meal cover on 02/12/13, adjusting Lantus as indicated.  3. ARF on CKD stage III: Unfortunately, baseline creatinine is unknown. Patient had a creatinine of 1.77 at presentation, but this  climbed to a maximum of 2.79 by 02/05/13, likely due to a combination of prerenal and ATN (contrast-induced nephropathy). Dr Katrine Coho provided nephrology consultation. Managed as recommended, and as of 02/08/13, creatinine had normalized at 1.20. As of 02/10/13, creatinine was 1.01.  4. Abnormal fluid and calcium collection in pelvis (noted via CT scan): This was an incidental finding, of unclear etiology. MRI of 02/06/13, showed interval resolution of intra pelvic fluid collection.  5. Gout: Asymptomatic. Continued on Allopurinol.  6. Hypertension: Uncontrolled Managing with Norvasc, started on 02/08/13. Have added Hydralazine today.  7. Constipation: Likely due to opioid analgesics. Managing with Miralax and Docusate. KUB pattern suggestive of ileum. Patient tolerating meals.  7. Diarrhea: Transient/Resolved.  8. Hyponatremia: Likely related to her acute renal failure as well as volume depletion/Resolved.  9. Hypokalemia: Repleted as indicated/Resolved  10. Cough: Patient has a mild cough, productive of occasionally brownish phlegm. Chest is clear. Suspect repeated GA/oral airway, contributory. Added Mucinex on 59/14. Surgical team has commenced incentive spirometry.  11. Anemia: HB at presentation was 11.4, but has drifted down during the course of hospitalization, presumably due to ABLA from repeaated surgeries. On 02/14/13, HB was 7.7. Transfused total of 2 units PRBC and HB is reasonable at 8.8 today. Following CBC. .    Code Status: Full Code.  Family Communication:  Disposition Plan: To be determined.    Brief narrative: 62 y.o. female with PMH of DM, Obesity, HTN, CKD 3, who presented with c/o diarrhea and a gluteal boil. She c/o diarrhea for ~5 days, non bloodly and non bilious, associated with lower abdominal cramps, with h/o recent antibiotic use 2-3 weeks prior  for UTI. In addition she noted a boil of her L buttock /perineal region for 1 week which had progressively increased in size and  was very painful. She denied any drainage from it. She denied any fevers, but did report chills. In the ER she was noted to be tachycardic with HR 130s, BP was low at 73/49 (improved with IVF to low 100s), WBC of 23K and electrolyte abormalities.  Since admission the patient has been treated with IV fluid resuscitation and empiric antibiotics. General surgery was consulted and the patient has been taken to the operating room multiple times for I and D of her gluteal abscess. The patient stabilized and was able to be transferred to the surgical floor on 02/05/2013.    Consultants: Nephrology.  General  Surgery   Procedures:  See notes.   Antibiotics:  Zosyn.   HPI/Subjective: Stronger today. No new issues.   Objective: Vital signs in last 24 hours: Temp:  [98 F (36.7 C)-98.6 F (37 C)] 98.4 F (36.9 C) (05/12 0620) Pulse Rate:  [107-116] 107 (05/12 0620) Resp:  [16-20] 18 (05/12 0620) BP: (153-166)/(52-86) 161/86 mmHg (05/12 0620) SpO2:  [96 %-100 %] 96 % (05/12 0943) Weight change:  Last BM Date: 02/16/13  Intake/Output from previous day: 05/11 0701 - 05/12 0700 In: 500 [I.V.:250; Blood:250] Out: 5000 [Urine:5000]     Physical Exam: General: Comfortable, alert, communicative, fully oriented, not short of breath at rest.  HEENT:  Moderate-marked clinical pallor, no jaundice, no conjunctival injection or discharge. NECK:  Supple, JVP not seen, no carotid bruits, no palpable lymphadenopathy, no palpable goiter. CHEST:  Clinically clear to auscultation, no wheezes, no crackles. HEART:  Sounds 1 and 2 heard, normal, regular, no murmurs. ABDOMEN:  Morbidly obese, soft, non-tender, no palpable organomegaly, no palpable masses, normal bowel sounds. GENITALIA:  Not examined.  BUTTOCKS: Not examined today. LOWER EXTREMITIES:  No pitting edema, palpable peripheral pulses. MUSCULOSKELETAL SYSTEM:  Generalized osteoarthritic changes, otherwise, normal. CENTRAL NERVOUS SYSTEM:   No focal neurologic deficit on gross examination.  Lab Results:  Recent Labs  02/15/13 0500 02/16/13 0500  WBC 12.7* 10.8*  HGB 8.0* 8.8*  HCT 24.6* 27.4*  PLT 344 355    Recent Labs  02/15/13 0500 02/16/13 0500  NA 137 137  K 4.0 4.0  CL 101 101  CO2 29 32  GLUCOSE 168* 185*  BUN 11 10  CREATININE 0.99 0.99  CALCIUM 8.6 8.6   Recent Results (from the past 240 hour(s))  MRSA PCR SCREENING     Status: None   Collection Time    02/08/13 11:15 PM      Result Value Range Status   MRSA by PCR NEGATIVE  NEGATIVE Final   Comment:            The GeneXpert MRSA Assay (FDA     approved for NASAL specimens     only), is one component of a     comprehensive MRSA colonization     surveillance program. It is not     intended to diagnose MRSA     infection nor to guide or     monitor treatment for     MRSA infections.     Studies/Results: No results found.  Medications: Scheduled Meds: . ipratropium  0.5 mg Nebulization TID   And  . albuterol  2.5 mg Nebulization TID  . allopurinol  100 mg Oral Daily  . amLODipine  10 mg Oral Daily  . cilostazol  100 mg  Oral BID  . darifenacin  7.5 mg Oral Daily  . feeding supplement  237 mL Oral Q24H  . feeding supplement  30 mL Oral TID WC  . guaiFENesin  1,200 mg Oral BID  . hydrALAZINE  25 mg Oral Q8H  . insulin aspart  0-20 Units Subcutaneous TID WC  . insulin aspart  8 Units Subcutaneous TID WC  . insulin glargine  20 Units Subcutaneous QHS  . levothyroxine  150 mcg Oral QAC breakfast  . multivitamin with minerals  1 tablet Oral Daily  . pantoprazole  40 mg Oral Daily  . piperacillin-tazobactam (ZOSYN)  IV  3.375 g Intravenous Q8H  . polyethylene glycol  17 g Oral BID  . senna-docusate  1 tablet Oral BID   Continuous Infusions: . sodium chloride 50 mL/hr at 02/16/13 0813   PRN Meds:.acetaminophen, albuterol, fluticasone, HYDROmorphone (DILAUDID) injection, ondansetron (ZOFRAN) IV, oxyCODONE, sodium chloride    LOS:  14 days   Dezi Schaner,CHRISTOPHER  [facility] Pager [phone]. If 8PM-8AM, please contact night-coverage at [email or web address], password [facility] 02/16/2013, 11:04 AM  LOS: 14 days

## 2013-02-16 NOTE — Progress Notes (Signed)
2 Days Post-Op  Subjective: Hungry, NPO for OR, having early satiety when she does eat  Objective: Vital signs in last 24 hours: Temp:  [98 F (36.7 C)-98.6 F (37 C)] 98.4 F (36.9 C) (05/12 0620) Pulse Rate:  [107-116] 107 (05/12 0620) Resp:  [16-20] 18 (05/12 0620) BP: (153-166)/(52-86) 161/86 mmHg (05/12 0620) SpO2:  [96 %-100 %] 99 % (05/12 0620) Last BM Date: 02/16/13  Intake/Output from previous day: 05/11 0701 - 05/12 0700 In: 500 [I.V.:250; Blood:250] Out: 5000 [Urine:5000] Intake/Output this shift:    General appearance: alert and cooperative Chest wall: NT, lungs CTA Cardio: regular rate and rhythm GI: soft, NT, perineal dressing left in place  Lab Results:   Recent Labs  02/15/13 0500 02/16/13 0500  WBC 12.7* 10.8*  HGB 8.0* 8.8*  HCT 24.6* 27.4*  PLT 344 355   BMET  Recent Labs  02/15/13 0500 02/16/13 0500  NA 137 137  K 4.0 4.0  CL 101 101  CO2 29 32  GLUCOSE 168* 185*  BUN 11 10  CREATININE 0.99 0.99  CALCIUM 8.6 8.6   PT/INR No results found for this basename: LABPROT, INR,  in the last 72 hours ABG No results found for this basename: PHART, PCO2, PO2, HCO3,  in the last 72 hours  Studies/Results: No results found.  Anti-infectives: Anti-infectives   Start     Dose/Rate Route Frequency Ordered Stop   02/03/13 1500  linezolid (ZYVOX) IVPB 600 mg  Status:  Discontinued     600 mg 300 mL/hr over 60 Minutes Intravenous Every 12 hours 02/03/13 1353 02/09/13 1056   02/03/13 1400  vancomycin (VANCOCIN) 1,750 mg in sodium chloride 0.9 % 500 mL IVPB  Status:  Discontinued     1,750 mg 250 mL/hr over 120 Minutes Intravenous Every 24 hours 02/02/13 1356 02/03/13 1443   02/02/13 1730  piperacillin-tazobactam (ZOSYN) IVPB 3.375 g     3.375 g 12.5 mL/hr over 240 Minutes Intravenous Every 8 hours 02/02/13 1646     02/02/13 1400  vancomycin (VANCOCIN) 2,000 mg in sodium chloride 0.9 % 500 mL IVPB     2,000 mg 250 mL/hr over 120 Minutes  Intravenous  Once 02/02/13 1355 02/02/13 1701      Assessment/Plan: s/p Procedure(s): INCISION AND DRAINAGE/DRESSING CHANGE (N/A) 1. Perirectal and gluteal necrotizing soft tissue infection and abscess; S/P multiple irrigation and debridement with placement of 5 Penrose drains  - Culture grew microaerophilic streptococcus; cont zosyn - Will go back to OR today - procedure, risks, benefits D/W patient who agrees 2. VTE - restart heparin post-op  LOS: 14 days    [name] [name] E 02/16/2013

## 2013-02-16 NOTE — Progress Notes (Signed)
Rn had long conversation with patient's husband about surgery. He is concerned about the patient continuing to go under general anesthesia for dressing changes. He was very upset that no one called him today regarding surgery and what the plan is. Rn assured him that tomorrow the md will update him on what the plan is regarding his wife. Husband was determined to get answers tonight regarding what the wound looks like etc. Rn encouraged the family member to wait until in the morning for update.

## 2013-02-16 NOTE — Preoperative (Signed)
Beta Blockers   Reason not to administer Beta Blockers:Not Applicable 

## 2013-02-16 NOTE — Progress Notes (Signed)
Physical Therapy Treatment Patient Details Name: [name] [name] MRN: [id number] DOB: [birth year] Today's Date: 02/16/2013 Time: [id number] PT Time Calculation (min): 24 min  PT Assessment / Plan / Recommendation Comments on Treatment Session  Good progress today, some dyspnea, but maintained sats on RA at 93%  EHR though was 138bpm.    Follow Up Recommendations  Home health PT;SNF;Other (comment)     Does the patient have the potential to tolerate intense rehabilitation     Barriers to Discharge        Equipment Recommendations  Rolling walker with 5" wheels;Other (comment)    Recommendations for Other Services    Frequency Min 3X/week   Plan Discharge plan remains appropriate;Frequency remains appropriate    Precautions / Restrictions Precautions Precautions: Fall   Pertinent Vitals/Pain See comments above    Mobility  Bed Mobility Bed Mobility: Supine to Sit;Sitting - Scoot to [employer] of Bed;Sit to Supine Supine to Sit: 4: Min assist Sitting - Scoot to Edge of Bed: 6: Modified independent (Device/Increase time) Sit to Supine: 4: Min assist Details for Bed Mobility Assistance: moving well, helping a bit due to pain Transfers Transfers: Sit to Stand;Stand to Sit Sit to Stand: 4: Min guard;From bed;From chair/3-in-1 Stand to Sit: 4: Min guard;With upper extremity assist;To bed;To chair/3-in-1 Details for Transfer Assistance: vc's for hand placement Ambulation/Gait Ambulation/Gait Assistance: 4: Min guard Ambulation Distance (Feet): 50 Feet Assistive device: Rolling walker Ambulation/Gait Assistance Details: less effortful, but still laborious with dyspnea Gait Pattern: Step-through pattern;Decreased step length - right;Decreased step length - left;Decreased stride length Stairs: No    Exercises     PT Diagnosis:    PT Problem List:   PT Treatment Interventions:     PT Goals Acute Rehab PT Goals Time For Goal Achievement: 02/24/13 Potential to Achieve Goals:  Good Pt will go Supine/Side to Sit: with modified independence;with HOB 0 degrees PT Goal: Supine/Side to Sit - Progress: Progressing toward goal Pt will go Sit to Stand: with modified independence PT Goal: Sit to Stand - Progress: Progressing toward goal Pt will Transfer Bed to Chair/Chair to Bed: with modified independence PT Transfer Goal: Bed to Chair/Chair to Bed - Progress: Progressing toward goal Pt will Ambulate: 16 - 50 feet;with modified independence;with least restrictive assistive device PT Goal: Ambulate - Progress: Progressing toward goal  Visit Information  Last PT Received On: 02/16/13 Assistance Needed: +1    Subjective Data  Subjective: I just can't sit long, that's when it hurts the most   Cognition  Cognition Arousal/Alertness: Awake/alert Behavior During Therapy: WFL for tasks assessed/performed Overall Cognitive Status: Within Functional Limits for tasks assessed    Balance  Balance Balance Assessed: Yes Static Standing Balance Static Standing - Balance Support: No upper extremity supported;Bilateral upper extremity supported Static Standing - Level of Assistance: 5: Stand by assistance Dynamic Standing Balance Dynamic Standing - Balance Support: No upper extremity supported;During functional activity;Right upper extremity supported;Left upper extremity supported Dynamic Standing - Level of Assistance: 5: Stand by assistance;Other (comment) (washing hands and cleaning up aound sink over 1-2 min)  End of Session PT - End of Session Activity Tolerance: Patient tolerated treatment well Patient left: in bed;with call bell/phone within reach Nurse Communication: Mobility status   GP     Carry Weesner, [name] 02/16/2013, 10:58 AM 02/16/2013  [name], PT [phone]  (pager)

## 2013-02-16 NOTE — Transfer of Care (Addendum)
Immediate Anesthesia Transfer of Care Note  Patient: [name] [name]  Procedure(s) Performed: Procedure(s): IRRIGATION AND DEBRIDEMENT PERINEAL ABSCESS (N/A)  Patient Location: PACU  Anesthesia Type:General  Level of Consciousness: awake, alert , oriented and patient cooperative  Airway & Oxygen Therapy: Patient Spontanous Breathing and Patient connected to face mask oxygen via NRB.    Post-op Assessment: Report given to PACU RN, Post -op Vital signs reviewed and stable and Patient moving all extremities X 4  Post vital signs: Reviewed and stable  Pt changed from NRB 15L to Bipap per RT.  RT at [facility].  See respiratory flow sheet for settings.  Complications: No apparent anesthesia complications

## 2013-02-16 NOTE — Anesthesia Preprocedure Evaluation (Signed)
Anesthesia Evaluation  Patient identified by MRN, date of birth, ID band Patient awake    Reviewed: Allergy & Precautions, H&P , NPO status , Patient's Chart, lab work & pertinent test results  Airway Mallampati: II TM Distance: >3 FB Neck ROM: full    Dental   Pulmonary Current Smoker,          Cardiovascular hypertension, + Peripheral Vascular Disease Rhythm:regular Rate:Normal     Neuro/Psych    GI/Hepatic GERD-  ,  Endo/Other  diabetes, Type 2, Insulin Dependent and Oral Hypoglycemic AgentsMorbid obesity  Renal/GU Renal disease     Musculoskeletal   Abdominal   Peds  Hematology   Anesthesia Other Findings   Reproductive/Obstetrics                           Anesthesia Physical Anesthesia Plan  ASA: III  Anesthesia Plan: General   Post-op Pain Management:    Induction: Intravenous  Airway Management Planned: Oral ETT  Additional Equipment:   Intra-op Plan:   Post-operative Plan: Extubation in OR  Informed Consent: I have reviewed the patients History and Physical, chart, labs and discussed the procedure including the risks, benefits and alternatives for the proposed anesthesia with the patient or authorized representative who has indicated his/her understanding and acceptance.     Plan Discussed with: CRNA, Anesthesiologist and Surgeon  Anesthesia Plan Comments:         Anesthesia Quick Evaluation

## 2013-02-17 ENCOUNTER — Encounter (HOSPITAL_COMMUNITY): Payer: Self-pay | Admitting: General Surgery

## 2013-02-17 LAB — BASIC METABOLIC PANEL
BUN: 10 mg/dL (ref 6–23)
CO2: 32 mEq/L (ref 19–32)
Calcium: 8.6 mg/dL (ref 8.4–10.5)
Chloride: 100 mEq/L (ref 96–112)
Creatinine, Ser: 0.98 mg/dL (ref 0.50–1.10)
GFR calc Af Amer: 70 mL/min — ABNORMAL LOW (ref >90)
GFR calc non Af Amer: 61 mL/min — ABNORMAL LOW (ref >90)
Glucose, Bld: 193 mg/dL — ABNORMAL HIGH (ref 70–99)
Potassium: 4.1 mEq/L (ref 3.5–5.1)
Sodium: 138 mEq/L (ref 135–145)

## 2013-02-17 LAB — GLUCOSE, CAPILLARY
Glucose-Capillary: 152 mg/dL — ABNORMAL HIGH (ref 70–99)
Glucose-Capillary: 229 mg/dL — ABNORMAL HIGH (ref 70–99)
Glucose-Capillary: 224 mg/dL — ABNORMAL HIGH (ref 70–99)
Glucose-Capillary: 139 mg/dL — ABNORMAL HIGH (ref 70–99)

## 2013-02-17 LAB — TYPE AND SCREEN
ABO/RH(D): O POS
Antibody Screen: NEGATIVE
Unit division: 0

## 2013-02-17 LAB — CBC
HCT: 26 % — ABNORMAL LOW (ref 36.0–46.0)
Hemoglobin: 8.3 g/dL — ABNORMAL LOW (ref 12.0–15.0)
MCH: 27.4 pg (ref 26.0–34.0)
MCHC: 31.9 g/dL (ref 30.0–36.0)
MCV: 85.8 fL (ref 78.0–100.0)
Platelets: 366 10*3/uL (ref 150–400)
RBC: 3.03 MIL/uL — ABNORMAL LOW (ref 3.87–5.11)
RDW: 18.6 % — ABNORMAL HIGH (ref 11.5–15.5)
WBC: 9.2 10*3/uL (ref 4.0–10.5)

## 2013-02-17 MED ORDER — FERROUS SULFATE 325 (65 FE) MG PO TABS
325.0000 mg | ORAL_TABLET | Freq: Three times a day (TID) | ORAL | Status: DC
  Administered 2013-02-17 – 2013-02-20 (×8): 325 mg via ORAL
  Filled 2013-02-17 (×14): qty 1

## 2013-02-17 MED ORDER — HYDROMORPHONE HCL PF 1 MG/ML IJ SOLN
1.5000 mg | INTRAMUSCULAR | Status: AC
  Administered 2013-02-17: 1.5 mg via INTRAVENOUS

## 2013-02-17 MED ORDER — INSULIN GLARGINE 100 UNIT/ML ~~LOC~~ SOLN
25.0000 [IU] | Freq: Every day | SUBCUTANEOUS | Status: DC
  Administered 2013-02-17 – 2013-02-19 (×3): 25 [IU] via SUBCUTANEOUS
  Filled 2013-02-17 (×5): qty 0.25

## 2013-02-17 MED ORDER — OXYCODONE HCL 5 MG PO TABS
10.0000 mg | ORAL_TABLET | ORAL | Status: DC | PRN
  Administered 2013-02-17 – 2013-02-19 (×7): 15 mg via ORAL
  Filled 2013-02-17: qty 3
  Filled 2013-02-17: qty 2
  Filled 2013-02-17 (×6): qty 3

## 2013-02-17 NOTE — Anesthesia Postprocedure Evaluation (Signed)
  Anesthesia Post-op Note  Patient: [name] [name]  Procedure(s) Performed: Procedure(s): IRRIGATION AND DEBRIDEMENT PERINEAL ABSCESS (N/A)  Patient Location: Nursing Unit  Anesthesia Type:General  Level of Consciousness: awake, alert , oriented and patient cooperative  Airway and Oxygen Therapy: Patient Spontanous Breathing  Post-op Pain: moderate  Post-op Assessment: Post-op Vital signs reviewed, Patient's Cardiovascular Status Stable, Respiratory Function Stable, Patent Airway, No signs of Nausea or vomiting and Pain level controlled  Post-op Vital Signs: Reviewed and stable  Complications: No apparent anesthesia complications

## 2013-02-17 NOTE — Progress Notes (Signed)
1 Day Post-Op  Subjective: Anxious and wheezing some.  She has had CPAP for some years but doesn't use it.  She has been using IV dilaudid , since her surgeries.  Foley in place.  She has had limited mobility at home, because of right hip replacement x 2. She was also smoking up until she was admitted which also adds to her pulmonary issues.  Objective: Vital signs in last 24 hours: Temp:  [96.7 F (35.9 C)-98.4 F (36.9 C)] 97.9 F (36.6 C) (05/13 0658) Pulse Rate:  [84-122] 105 (05/13 0658) Resp:  [12-30] 16 (05/13 0658) BP: (120-211)/(55-143) 144/81 mmHg (05/13 0658) SpO2:  [89 %-100 %] 99 % (05/13 0755) Last BM Date: 02/16/13 Afebrile,VSS (sats 98% with 2 liter [place]) Labs OK on going anemia Diet: carb modified Last BM this AM. Intake/Output from previous day: 05/12 0701 - 05/13 0700 In: 2292.4 [I.V.:2225.1; IV Piggyback:67.3] Out: 2270 [Urine:2250; Blood:20] Intake/Output this shift:    General appearance: alert, cooperative and no distress Breasts: normal appearance, no masses or tenderness, left breast I/D site looks fine about 1 cm in diam, clean and healing well. GI: soft, non-tender; bowel sounds normal; no masses,  no organomegaly Skin: Skin color, texture, turgor normal. No rashes or lesions or Open area is 10--12 cm in diameter.  She has penrose drains going to sites below the skin each about 8 CM deep.  the wound is clean and pink.  some purulent and some stool staining to the packing.    Lab Results:   Recent Labs  02/16/13 0500 02/17/13 0530  WBC 10.8* 9.2  HGB 8.8* 8.3*  HCT 27.4* 26.0*  PLT 355 366    BMET  Recent Labs  02/16/13 0500 02/17/13 0530  NA 137 138  K 4.0 4.1  CL 101 100  CO2 32 32  GLUCOSE 185* 193*  BUN 10 10  CREATININE 0.99 0.98  CALCIUM 8.6 8.6   PT/INR No results found for this basename: LABPROT, INR,  in the last 72 hours   Recent Labs Lab 02/11/13 0500  ALBUMIN 1.6*     Lipase     Component Value Date/Time    LIPASE 17 02/02/2013 0850     Studies/Results: No results found.  Medications: . ipratropium  0.5 mg Nebulization TID   And  . albuterol  2.5 mg Nebulization TID  . allopurinol  100 mg Oral Daily  . amLODipine  10 mg Oral Daily  . cilostazol  100 mg Oral BID  . darifenacin  7.5 mg Oral Daily  . feeding supplement  237 mL Oral Q24H  . feeding supplement  30 mL Oral TID WC  . guaiFENesin  1,200 mg Oral BID  . hydrALAZINE  25 mg Oral Q8H  . insulin aspart  0-20 Units Subcutaneous TID WC  . insulin aspart  8 Units Subcutaneous TID WC  . insulin glargine  20 Units Subcutaneous QHS  . levothyroxine  150 mcg Oral QAC breakfast  . multivitamin with minerals  1 tablet Oral Daily  . pantoprazole  40 mg Oral Daily  . piperacillin-tazobactam (ZOSYN)  IV  3.375 g Intravenous Q8H  . polyethylene glycol  17 g Oral BID  . senna-docusate  1 tablet Oral BID    Assessment/Plan left buttock abscess, left breast abscess  S/p INCISION AND DRAINAGE L BUTTOCK ABSCESS AND L BREAST ABSCESS, 02/04/2013, Harl Bowie, MD IRRIGATION AND DEBRIDEMENT LEFT BUTTOCK ABSCESS AND DRESSING CHANGE, 02/06/2013, 02/07/13, Harl Bowie, MD  Perirectal and  gluteal necrotizing soft tissue infection abscess, IRRIGATION AND DEBRIDEMENT ABSCESS/DRESSING CHANGE, Gwenyth Ober, MD.02/08/2013   Irrigation and debridement of left perirectal, peroneal, and gluteal regions with placement of Penrose drains, 02/10/2013,  [name]. Tsuei, MD  performed: Irrigation and debridement of left perirectal, peroneal, and gluteal regions with placement of Penrose drains  Surgeon:TSUEI,MATTHEW K. 02/12/2013.  dressing change, 02/14/2013 Harl Bowie, MD  IRRIGATION AND DEBRIDEMENT PERINEAL ABSCESS, DRESSING CHANGE UNDER ANESTHESIA  02/16/2013, Zenovia Jarred, MD.  Sepsis:   8 trips to OR for dressing change of her abscess. FEW MICROAEROPHILIC STREPTOCOCCI Note: Standardized susceptibility testing for this organism  is not available.   Day 15 of Zosyn DM (diabetes mellitus) Poor control CKD (chronic kidney disease), stage III  Cellulitis  Morbid obesity Body mass index is 59.9 ARF (acute renal failure) Acute blood loss anemia  HTN (hypertension) COPD/Tobacco use Sleep apnea, she does not use her CPAP at home.   Plan:  Start her on Wet to dry dressing changes at the bedside, BID. Her wound looks good and there is no more necrotic tissue. We could even get her in the shower if she can stand safely. Work on switching her over to oral medicines, for pain control.    She needs good glucose control.  We need to start mobilizing her so she can get up and use the toilet to void; get her foley out after she starts getting up. Also, to help with pulmonary issues  I think we need to consider SNF for bid dressing changes after d/c.  She does have a sister who may be able to help with dressing changes after she goes home.  She is married, but lives alone and as noted above has limited mobility under the best of circumstances.      LOS: 15 days    Saagar Tortorella 02/17/2013

## 2013-02-17 NOTE — Progress Notes (Signed)
[facility] PROGRESS NOTE  [name] [name] [id number] DOB: 16-May-1951 DOA: 02/02/2013 PCP: [name], MD  Assessment/Plan: Principal Problem:   Sepsis Active Problems:   DM (diabetes mellitus)   CKD (chronic kidney disease), stage III   Cellulitis   Morbid obesity   ARF (acute renal failure)   Diarrhea   Hyponatremia   Hypokalemia   Cellulitis and abscess of buttock   Acute blood loss anemia   HTN (hypertension)    1. Sepsis/Perirectal and gluteal necrotizing abscess: Patient presented with a boil of her L buttock/perineal region for 1 week which had progressively increased in size and was very painful, associatyed with chills. In the ER she was noted to be tachycardic with HR 130s, BP was low at 73/49 (improved with IVF to low 100s), WBC was 23, consistent with SIRS/Sepsis. She was initially managed with [name] [name], then Zyvox was substituted for vancomycin, due to developing ARF. Dr [name] provided surgical consultation, and patient is s/p I&D in OR per Gen Surg 02/04/13, 02/06/13 , 02/08/13, and 02/10/13. Culture grew microaerophilic strept. Clinically, sepsis has resolved, and Zyvox was discontinued on 02/09/13. Now only on Zosyn day #16. [place] is trending down, patient is afebrile, and feels considerably better. Patient is s/p irrigation and debridement of left perirectal, peroneal, and gluteal regions with placement of Penrose drains by Dr Georgette Dover on 02/12/13, dressings change in OR on 02/14/13 and dressing change in OR on 02/16/13. Have discussed duration of antibiotics with surgeons, today, and as wound appears clean, they have recommended discontinue antibiotics today.  2. DM: Patient has known insulin-requiring DM-2. Managing currently with Lantus/SSI, as patient has had poor appetite, and there was concern for the possiblity of hypoglycemia with 70/30, which has been discontinued. CBCs have improved, added meal cover on 02/12/13, adjusting Lantus as indicated.  3. ARF  on CKD stage III: Unfortunately, baseline creatinine is unknown. Patient had a creatinine of 1.77 at presentation, but this climbed to a maximum of 2.79 by 02/05/13, likely due to a combination of prerenal and ATN (contrast-induced nephropathy). Dr Katrine Coho provided nephrology consultation. Managed as recommended, and as of 02/08/13, creatinine had normalized at 1.20. As of 02/10/13, creatinine was 1.01.  4. Abnormal fluid and calcium collection in pelvis (noted via CT scan): This was an incidental finding, of unclear etiology. MRI of 02/06/13, showed interval resolution of intra pelvic fluid collection.  5. Gout: Asymptomatic. Continued on Allopurinol.  6. Hypertension: Uncontrolled initially. Managing with Norvasc, started on 02/08/13, with satisfactory control.  7. Constipation: Likely due to opioid analgesics. Managing with Miralax and Docusate. KUB pattern suggestive of ileum. Patient tolerating meals.  7. Diarrhea: Transient/Resolved.  8. Hyponatremia: Likely related to her acute renal failure as well as volume depletion/Resolved.  9. Hypokalemia: Repleted as indicated/Resolved  10. Cough: Patient has a mild cough, productive of occasionally brownish phlegm. Chest is clear. Suspect repeated GA/oral airway, contributory. Added Mucinex on 59/14. Surgical team has commenced incentive spirometry. As of 02/16/13, cough had resolved. 11. Anemia: HB at presentation was 11.4, but has drifted down during the course of hospitalization, presumably due to ABLA from repeaated surgeries. On 02/14/13, HB was 7.7. Transfused total of 2 units PRBC and HB was reasonable at 8.8 on 02/16/13, and has remained stable. Following CBC. Have commenced iron supplements.    Code Status: Full Code.  Family Communication:  Disposition Plan: To be determined. Recommended CIR vs SNF. Rehab MD consult requested on 02/17/13.    Brief narrative: 62 y.o. female with  PMH of DM, Obesity, HTN, CKD 3, who presented with c/o diarrhea and  a gluteal boil. She c/o diarrhea for ~5 days, non bloodly and non bilious, associated with lower abdominal cramps, with h/o recent antibiotic use 2-3 weeks prior for UTI. In addition she noted a boil of her L buttock /perineal region for 1 week which had progressively increased in size and was very painful. She denied any drainage from it. She denied any fevers, but did report chills. In the ER she was noted to be tachycardic with HR 130s, BP was low at 73/49 (improved with IVF to low 100s), WBC of 23K and electrolyte abormalities.  Since admission the patient has been treated with IV fluid resuscitation and empiric antibiotics. General surgery was consulted and the patient has been taken to the operating room multiple times for I and D of her gluteal abscess. The patient stabilized and was able to be transferred to the surgical floor on 02/05/2013.    Consultants: Nephrology.  General  Surgery   Procedures:  See notes.   Antibiotics:  Zosyn discontinued on 02/17/13. Marland Kitchen   HPI/Subjective: No new issues.   Objective: Vital signs in last 24 hours: Temp:  [97.9 F (36.6 C)-98.4 F (36.9 C)] 98.1 F (36.7 C) (05/13 1404) Pulse Rate:  [84-137] 113 (05/13 1404) Resp:  [12-30] 18 (05/13 1404) BP: (120-202)/(46-143) 135/46 mmHg (05/13 1404) SpO2:  [88 %-100 %] 97 % (05/13 1404) Weight change:  Last BM Date: 02/16/13  Intake/Output from previous day: 05/12 0701 - 05/13 0700 In: 2292.4 [I.V.:2225.1; IV Piggyback:67.3] Out: 2270 [Urine:2250; Blood:20] Total I/O In: 600 [P.O.:600] Out: 725 [Urine:725]   Physical Exam: General: Comfortable, sitting in chair, alert, communicative, fully oriented, not short of breath at rest.  HEENT:  Moderate clinical pallor, no jaundice, no conjunctival injection or discharge. NECK:  Supple, JVP not seen, no carotid bruits, no palpable lymphadenopathy, no palpable goiter. CHEST:  Clinically clear to auscultation, no wheezes, no crackles. HEART:  Sounds  1 and 2 heard, normal, regular, no murmurs. ABDOMEN:  Morbidly obese, soft, non-tender, no palpable organomegaly, no palpable masses, normal bowel sounds. GENITALIA:  Not examined.  BUTTOCKS: Not examined today. LOWER EXTREMITIES:  No pitting edema, palpable peripheral pulses. MUSCULOSKELETAL SYSTEM:  Generalized osteoarthritic changes, otherwise, normal. CENTRAL NERVOUS SYSTEM:  No focal neurologic deficit on gross examination.  Lab Results:  Recent Labs  02/16/13 0500 02/17/13 0530  WBC 10.8* 9.2  HGB 8.8* 8.3*  HCT 27.4* 26.0*  PLT 355 366    Recent Labs  02/16/13 0500 02/17/13 0530  NA 137 138  K 4.0 4.1  CL 101 100  CO2 32 32  GLUCOSE 185* 193*  BUN 10 10  CREATININE 0.99 0.98  CALCIUM 8.6 8.6   Recent Results (from the past 240 hour(s))  MRSA PCR SCREENING     Status: None   Collection Time    02/08/13 11:15 PM      Result Value Range Status   MRSA by PCR NEGATIVE  NEGATIVE Final   Comment:            The GeneXpert MRSA Assay (FDA     approved for NASAL specimens     only), is one component of a     comprehensive MRSA colonization     surveillance program. It is not     intended to diagnose MRSA     infection nor to guide or     monitor treatment for     MRSA  infections.     Studies/Results: No results found.  Medications: Scheduled Meds: . ipratropium  0.5 mg Nebulization TID   And  . albuterol  2.5 mg Nebulization TID  . allopurinol  100 mg Oral Daily  . amLODipine  10 mg Oral Daily  . cilostazol  100 mg Oral BID  . darifenacin  7.5 mg Oral Daily  . feeding supplement  237 mL Oral Q24H  . feeding supplement  30 mL Oral TID WC  . guaiFENesin  1,200 mg Oral BID  . hydrALAZINE  25 mg Oral Q8H  . insulin aspart  0-20 Units Subcutaneous TID WC  . insulin aspart  8 Units Subcutaneous TID WC  . insulin glargine  25 Units Subcutaneous QHS  . levothyroxine  150 mcg Oral QAC breakfast  . multivitamin with minerals  1 tablet Oral Daily  .  pantoprazole  40 mg Oral Daily  . piperacillin-tazobactam (ZOSYN)  IV  3.375 g Intravenous Q8H  . polyethylene glycol  17 g Oral BID  . senna-docusate  1 tablet Oral BID   Continuous Infusions:   PRN Meds:.acetaminophen, albuterol, fluticasone, HYDROmorphone (DILAUDID) injection, ondansetron (ZOFRAN) IV, oxyCODONE, oxyCODONE, sodium chloride    LOS: 15 days   Aroldo Galli,CHRISTOPHER  [facility] Pager [phone]. If 8PM-8AM, please contact night-coverage at [email or web address], password [facility] 02/17/2013, 2:36 PM  LOS: 15 days

## 2013-02-17 NOTE — Progress Notes (Signed)
Rehab Admissions Coordinator Note:  Patient was screened by Cleatrice Burke for appropriateness for an Inpatient Acute Rehab Consult.  At this time, we are recommending [place] or [facility].Marland Kitchen [employer] will not approve an inpt rehab admission for this diagnosis.  Cleatrice Burke 02/17/2013, 9:36 PM  I can be reached at [phone].

## 2013-02-17 NOTE — Progress Notes (Signed)
Physical Therapy Treatment Patient Details Name: [name] [name] MRN: [id number] DOB: [birth year] Today's Date: 02/17/2013 Time: [id number] PT Time Calculation (min): 38 min  PT Assessment / Plan / Recommendation Comments on Treatment Session  62 y.o. female admitted to [facility] for diarrhea and gluteal boil.  She has since had the boil /abcess drained and has been back to the OR many times for I&D and dressing changes.  Today was the first day they did not do the dressing change in the OR.  The pt is progressing slowly, but steadily with mobility.  She has a pretty low tolerance of activity due to breathing and HR increasing, but does not need much outside assistance to be safe.  Would inpatient rehab be a viable option for her?      Follow Up Recommendations  CIR     Does the patient have the potential to tolerate intense rehabilitation    yes  Barriers to Discharge   decreased caregiver support      Equipment Recommendations  Rolling walker with 5" wheels (bariatric)    Recommendations for Other Services Rehab consult  Frequency Min 3X/week   Plan Discharge plan remains appropriate;Frequency remains appropriate    Precautions / Restrictions Precautions Precautions: Fall Precaution Comments: monitor O2 sats with gait Restrictions Weight Bearing Restrictions: No   Pertinent Vitals/Pain O2 sats dropped to 88% on RA with gait, HR increased to 137 from 117 during gait.  DOE 3/4.      Mobility  Bed Mobility Bed Mobility: Supine to Sit Supine to Sit: 3: Mod assist;HOB elevated Sitting - Scoot to Edge of Bed: 5: Supervision Sit to Supine: 3: Mod assist;With rail;HOB flat Details for Bed Mobility Assistance: mod assist of bil legs to get back into bed.   Transfers Sit to Stand: 5: Supervision;With upper extremity assist;With armrests;From chair/3-in-1 Stand to Sit: 5: Supervision;With upper extremity assist;To bed;To elevated surface;With armrests Details for Transfer Assistance:  supervision for safety due to increased time and effort needed to stand from chair.   Ambulation/Gait Ambulation/Gait Assistance: 4: Min assist Ambulation Distance (Feet): 60 Feet Assistive device: Rolling walker Ambulation/Gait Assistance Details: pt with increased DOE with gait, O2 sats did drop today to 88% while walking and HR increased to 137 bpm  Gait Pattern: Step-through pattern;Shuffle;Trunk flexed Gait velocity: less than 1.8 ft/sec indicating risk for recurrent falls General Gait Details: pt needed ~30 sec standing rest break to catch her breath while walking.   She like to lean on her elbows while walking.      Exercises Other Exercises Other Exercises: Spoke at length re: energy conservation, need for multple rest breaks when doing her normal routiene at home.  We also spoke at length re: discharge destination.  She reports that she would really like to go home, but knows that she will need someone to change her dressing twice a day and doesn't have anyone right now who can do that.      PT Goals Acute Rehab PT Goals PT Goal: Sit to Stand - Progress: Progressing toward goal PT Goal: Ambulate - Progress: Progressing toward goal  Visit Information  Last PT Received On: 02/17/13 Assistance Needed: +1    Subjective Data  Subjective: Pt reports she has been sitting up for over an hour Patient Stated Goal: to go home   Cognition  Cognition Arousal/Alertness: Awake/alert Behavior During Therapy: WFL for tasks assessed/performed Overall Cognitive Status: Within Functional Limits for tasks assessed    Balance  Static Standing Balance Static Standing -  Balance Support: Right upper extremity supported;Left upper extremity supported Static Standing - Level of Assistance: 5: Stand by assistance Static Standing - Comment/# of Minutes: Pt stood for 8-10 mins but propped on elbows at the sink majority of time.  End of Session PT - End of Session Activity Tolerance: Patient limited  by fatigue;Patient limited by pain Patient left: in bed;with call bell/phone within reach Nurse Communication: Other (comment) (O2 sats with gait on RA)    Nelli Swalley B. [place], [place], DPT [phone]   02/17/2013, 2:09 PM

## 2013-02-17 NOTE — Progress Notes (Signed)
Patient seen and examined.  Agree with PA's note.  

## 2013-02-17 NOTE — Progress Notes (Signed)
Occupational Therapy Treatment Patient Details Name: [name] [name] MRN: [id number] DOB: May 02, 1951 Today's Date: 02/17/2013 Time: 1112-1200 OT Time Calculation (min): 48 min  OT Assessment / Plan / Recommendation Comments on Treatment Session Pt overall min assist for sit to stand from lower toilet and min guard assist for mobility to and from the bathroom.  O2 sats 88-95% on room air, but unable to obtain consistent accurrate reading.  Able to tolerate increased activity this session including static standing at the sink for 8-10 mins without rest.      Follow Up Recommendations  SNF       Equipment Recommendations  3 in 1 bedside comode       Frequency Min 2X/week   Plan Discharge plan remains appropriate    Precautions / Restrictions Precautions Precautions: Fall Restrictions Weight Bearing Restrictions: No   Pertinent Vitals/Pain Buttocks pain meds given prior to session, O2 sats 88-95% on room air    ADL  Grooming: Performed;Wash/dry hands;Wash/dry face;Teeth care;Min guard Where Assessed - Grooming: Supported standing Lower Body Dressing: Performed;+1 Total assistance;Other (comment) (gripper socks, no assistive device) Where Assessed - Lower Body Dressing: Supported sit to Lobbyist: Performed;Minimal assistance Toilet Transfer Method: Other (comment) (ambulate with RW) Science writer: Comfort height toilet;Grab bars Toileting - Clothing Manipulation and Hygiene: Simulated;Maximal assistance Where Assessed - Toileting Clothing Manipulation and Hygiene: Sit to stand from 3-in-1 or toilet Transfers/Ambulation Related to ADLs: Pt overall min guard assist for mobility using the RW. ADL Comments: Pt motivated to get better.  Able to stand at the sink for 8-10 mins during grooming tasks, but needs use of the counter to prop on her arms.  When attempting to stand unsupported, such as when putting toothpaste on her toothbrush, she needs min assist to keep  from losing her balance.  O2 sats     OT Goals Acute Rehab OT Goals OT Goal Formulation: With patient Time For Goal Achievement: 03/03/13 Potential to Achieve Goals: Good ADL Goals Pt Will Perform Grooming: with modified independence;Supported ADL Goal: Grooming - Progress: Goal set today Pt Will Perform Lower Body Bathing: with supervision;with adaptive equipment;Sit to stand from bed ADL Goal: Lower Body Bathing - Progress: Goal set today Pt Will Perform Lower Body Dressing: with supervision;Sit to stand from bed;with adaptive equipment ADL Goal: Lower Body Dressing - Progress: Goal set today Pt Will Transfer to Toilet: with supervision;with DME;Extra wide 3-in-1 ADL Goal: Toilet Transfer - Progress: Goal set today Pt Will Perform Toileting - Clothing Manipulation: with supervision;Sitting on 3-in-1 or toilet ADL Goal: Toileting - Clothing Manipulation - Progress: Goal set today Pt Will Perform Toileting - Hygiene: with supervision;with adaptive equipment;Sit to stand from 3-in-1/toilet ADL Goal: Toileting - Hygiene - Progress: Goal set today Miscellaneous OT Goals Miscellaneous OT Goal #1: Pt will transfer supine to sit EOB with supervision in preparation for selfcare tasks.  Visit Information  Last OT Received On: 02/17/13 Assistance Needed: +1    Subjective Data  Subjective: I don't know if I can sit up in that chair, but I want to try.      Cognition  Cognition Arousal/Alertness: Awake/alert Behavior During Therapy: WFL for tasks assessed/performed Overall Cognitive Status: Within Functional Limits for tasks assessed    Mobility  Bed Mobility Bed Mobility: Supine to Sit Supine to Sit: 3: Mod assist;HOB elevated Sitting - Scoot to Edge of Bed: 5: Supervision Transfers Transfers: Sit to Stand Sit to Stand: 4: Min assist;From toilet;With upper extremity assist Stand to Sit: 4:  Min guard;With upper extremity assist;To chair/3-in-1       Balance Static Standing  Balance Static Standing - Balance Support: Right upper extremity supported;Left upper extremity supported Static Standing - Level of Assistance: 5: Stand by assistance Static Standing - Comment/# of Minutes: Pt stood for 8-10 mins but propped on elbows at the sink majority of time.   End of Session OT - End of Session Activity Tolerance: Patient tolerated treatment well Patient left: in chair;with call bell/phone within reach Nurse Communication: Mobility status     Waldo OTR/L Pager number [phone] 02/17/2013, 1:17 PM

## 2013-02-18 LAB — BASIC METABOLIC PANEL
BUN: 9 mg/dL (ref 6–23)
CO2: 34 mEq/L — ABNORMAL HIGH (ref 19–32)
Calcium: 8.7 mg/dL (ref 8.4–10.5)
Chloride: 102 mEq/L (ref 96–112)
Creatinine, Ser: 0.95 mg/dL (ref 0.50–1.10)
GFR calc Af Amer: 73 mL/min — ABNORMAL LOW (ref >90)
GFR calc non Af Amer: 63 mL/min — ABNORMAL LOW (ref >90)
Glucose, Bld: 119 mg/dL — ABNORMAL HIGH (ref 70–99)
Potassium: 4 mEq/L (ref 3.5–5.1)
Sodium: 141 mEq/L (ref 135–145)

## 2013-02-18 LAB — GLUCOSE, CAPILLARY
Glucose-Capillary: 105 mg/dL — ABNORMAL HIGH (ref 70–99)
Glucose-Capillary: 148 mg/dL — ABNORMAL HIGH (ref 70–99)
Glucose-Capillary: 147 mg/dL — ABNORMAL HIGH (ref 70–99)
Glucose-Capillary: 163 mg/dL — ABNORMAL HIGH (ref 70–99)

## 2013-02-18 LAB — CBC
HCT: 27.8 % — ABNORMAL LOW (ref 36.0–46.0)
Hemoglobin: 8.6 g/dL — ABNORMAL LOW (ref 12.0–15.0)
MCH: 26.8 pg (ref 26.0–34.0)
MCHC: 30.9 g/dL (ref 30.0–36.0)
MCV: 86.6 fL (ref 78.0–100.0)
Platelets: 373 10*3/uL (ref 150–400)
RBC: 3.21 MIL/uL — ABNORMAL LOW (ref 3.87–5.11)
RDW: 18.6 % — ABNORMAL HIGH (ref 11.5–15.5)
WBC: 8 10*3/uL (ref 4.0–10.5)

## 2013-02-18 NOTE — Progress Notes (Signed)
[facility] PROGRESS NOTE  [name] [name] [id number] DOB: 1950/11/03 DOA: 02/02/2013 PCP: [name], MD  Assessment/Plan: 1. Sepsis/Perirectal and gluteal necrotizing abscess: Patient presented with a boil of her L buttock/perineal region for 1 week which had progressively increased in size and was very painful, associatyed with chills. Patient is s/p irrigation and debridement of left perirectal, peroneal, and gluteal regions with placement of Penrose drains by Dr Georgette Dover on 02/12/13, dressings change in OR on 02/14/13 and dressing change in OR on 02/16/13. Surgical recs to stop antibiotics 2. DM: Patient has known insulin-requiring DM-2. Managing currently with Lantus/SSI, as patient has had poor appetite, and there was concern for the possiblity of hypoglycemia with 70/30, which has been discontinued. Patient continues on Lantus. 3. ARF on CKD stage III: Patient is a peak creatinine of 2.79. This has since resolved. 4. Gout: Asymptomatic. Continued on Allopurinol. Stable 5. Hypertension: Stable  6. Constipation: Reports multiple bowel movements today.  7. Diarrhea: Transient/Resolved.  8. Hyponatremia: Resolved 9. Hypokalemia: Repleted as indicated/Resolved  10. Cough: Resolved.  11. Anemia: HB at presentation was 11.4, but has drifted down during the course of hospitalization, presumably due to ABLA from repeaated surgeries. On 02/14/13, HB was 7.7. Transfused total of 2 units PRBC and HB was reasonable at 8.8 on 02/16/13, resolved w/ transfusion.     Code Status: Full Family Communication: Patient (indicate person spoken with, relationship, and if by phone, the number) Disposition Plan: Skilled nursing facility   Consultants:  Dr. Grandville Silos, M.D.-surgery  HPI/Subjective: Patient with out overnight events. Currently complains of residual pain.  Objective: Filed Vitals:   02/18/13 0638 02/18/13 0733 02/18/13 1028 02/18/13 1536  BP: 158/90  155/76 138/70  Pulse: 108  106  88  Temp: 98.2 F (36.8 C)  98.2 F (36.8 C) 98.1 F (36.7 C)  TempSrc: Oral  Oral Oral  Resp: 18  19 19   Height:      Weight:      SpO2: 98% 98% 98% 92%    Intake/Output Summary (Last 24 hours) at 02/18/13 1550 Last data filed at 02/18/13 1300  Gross per 24 hour  Intake  468.2 ml  Output   2327 ml  Net -1858.8 ml   Filed Weights   02/10/13 0613 02/11/13 0506 02/13/13 0512  Weight: 152.6 kg (336 lb 6.8 oz) 154.3 kg (340 lb 2.7 oz) 158.5 kg (349 lb 6.9 oz)    Exam:   General:  Patient's awake alert and oriented in no apparent distress  Cardiovascular: Regular S1-S2 no murmurs  Respiratory: Normal respiratory effort, no wheezing no crackles  Abdomen: Soft obese nontender nondistended  Musculoskeletal: We'll perfused, no clubbing or cyanosis   Data Reviewed: Basic Metabolic Panel:  Recent Labs Lab 02/14/13 0436 02/15/13 0500 02/16/13 0500 02/17/13 0530 02/18/13 0500  NA 138 137 137 138 141  K 4.0 4.0 4.0 4.1 4.0  CL 103 101 101 100 102  CO2 30 29 32 32 34*  GLUCOSE 173* 168* 185* 193* 119*  BUN 9 11 10 10 9   CREATININE 0.93 0.99 0.99 0.98 0.95  CALCIUM 8.3* 8.6 8.6 8.6 8.7   Liver Function Tests: No results found for this basename: AST, ALT, ALKPHOS, BILITOT, PROT, ALBUMIN,  in the last 168 hours No results found for this basename: LIPASE, AMYLASE,  in the last 168 hours No results found for this basename: AMMONIA,  in the last 168 hours CBC:  Recent Labs Lab 02/14/13 0436 02/15/13 0500 02/16/13 0500 02/17/13 0530 02/18/13 0500  WBC 16.0* 12.7* 10.8* 9.2 8.0  HGB 7.7* 8.0* 8.8* 8.3* 8.6*  HCT 24.5* 24.6* 27.4* 26.0* 27.8*  MCV 83.1 84.0 83.8 85.8 86.6  PLT 347 344 355 366 373   Cardiac Enzymes: No results found for this basename: CKTOTAL, CKMB, CKMBINDEX, TROPONINI,  in the last 168 hours BNP (last 3 results) No results found for this basename: PROBNP,  in the last 8760 hours CBG:  Recent Labs Lab 02/17/13 1203 02/17/13 1711  02/17/13 2132 02/18/13 0749 02/18/13 1138  GLUCAP 229* 224* 139* 105* 148*    Recent Results (from the past 240 hour(s))  MRSA PCR SCREENING     Status: None   Collection Time    02/08/13 11:15 PM      Result Value Range Status   MRSA by PCR NEGATIVE  NEGATIVE Final   Comment:            The GeneXpert MRSA Assay (FDA     approved for NASAL specimens     only), is one component of a     comprehensive MRSA colonization     surveillance program. It is not     intended to diagnose MRSA     infection nor to guide or     monitor treatment for     MRSA infections.     Studies: No results found.  Scheduled Meds: . ipratropium  0.5 mg Nebulization TID   And  . albuterol  2.5 mg Nebulization TID  . allopurinol  100 mg Oral Daily  . amLODipine  10 mg Oral Daily  . cilostazol  100 mg Oral BID  . darifenacin  7.5 mg Oral Daily  . feeding supplement  237 mL Oral Q24H  . ferrous sulfate  325 mg Oral TID WC  . guaiFENesin  1,200 mg Oral BID  . hydrALAZINE  25 mg Oral Q8H  . insulin aspart  0-20 Units Subcutaneous TID WC  . insulin aspart  8 Units Subcutaneous TID WC  . insulin glargine  25 Units Subcutaneous QHS  . levothyroxine  150 mcg Oral QAC breakfast  . multivitamin with minerals  1 tablet Oral Daily  . pantoprazole  40 mg Oral Daily  . polyethylene glycol  17 g Oral BID  . senna-docusate  1 tablet Oral BID   Continuous Infusions:   Principal Problem:   Sepsis Active Problems:   DM (diabetes mellitus)   CKD (chronic kidney disease), stage III   Cellulitis   Morbid obesity   ARF (acute renal failure)   Diarrhea   Hyponatremia   Hypokalemia   Cellulitis and abscess of buttock   Acute blood loss anemia   HTN (hypertension)    Time spent: 20 minutes    CHIU, [facility] Pager [phone]. If 7PM-7AM, please contact night-coverage at [email or web address], password [facility] 02/18/2013, 3:50 PM  LOS: 16 days

## 2013-02-18 NOTE — Progress Notes (Signed)
Patient examined and I agree with the assessment and plan  Georganna Skeans, MD, MPH, FACS Pager: [phone]  02/18/2013 3:15 PM

## 2013-02-18 NOTE — Progress Notes (Signed)
NUTRITION FOLLOW UP  Intervention:   1.  Supplements; continue Glucerna Shake po daily, each supplement provides 220 kcal and 10 grams of protein.  D/C Prostat if pt as pt is refusing. 2.  Modify diet; RD to add double protein portions to minimize CHO-containing supplements while encouraging protein intake.    Nutrition Dx:   Increased nutrient needs, ongoing.   Goal:  1. Intake to meet at least 90% of estimated needs. Met with adequate kcal intake, working to ensure protein adequate. 2. Verbalize basic understanding of MAOI Nutrition Therapy.   Monitor:  weight trends, lab trends, I/O's, PO intake, supplement tolerance  Assessment:   PMHx of DM, obesity, HTN, CKD III. Admitted with diarrhea x 3 days and abdominal cramps. Recent abx use for UTI approx 2-3 weeks ago. Pt with boil increasing in size on L buttock/perineal region.  Pt s/p several I&D and return to OR for dressing change.   Pt previously maintained on zyvox which has been discontinued. Pt's intake at meals has improved to 100% and she is currently accepting supplements- Glucerna once daily and refusing Prostat 2-3 times daily due to diarrhea.  No diet restriction needed at this time as Zyvox completed ~2 weeks ago. Discussed nutrition-related needs with pt, including need for increased protein intake.  Pt verbalizes understanding.  Will order double protein portions.    Height: Ht Readings from Last 1 Encounters:  02/02/13 5\' 4"  (1.626 m)    Weight Status:   Wt Readings from Last 1 Encounters:  02/13/13 349 lb 6.9 oz (158.5 kg)    Re-estimated needs:  Kcal: 2200-2400 Protein: 100-115g Fluid: 2.2-2.4 L/day  Skin: incision, wounds  Diet Order: Carb Control   Intake/Output Summary (Last 24 hours) at 02/18/13 1143 Last data filed at 02/18/13 1000  Gross per 24 hour  Intake  708.2 ml  Output   3051 ml  Net -2342.8 ml    Last BM: 4/28   Labs:   Recent Labs Lab 02/16/13 0500 02/17/13 0530 02/18/13 0500   NA 137 138 141  K 4.0 4.1 4.0  CL 101 100 102  CO2 32 32 34*  BUN 10 10 9   CREATININE 0.99 0.98 0.95  CALCIUM 8.6 8.6 8.7  GLUCOSE 185* 193* 119*    CBG (last 3)   Recent Labs  02/17/13 2132 02/18/13 0749 02/18/13 1138  GLUCAP 139* 105* 148*    Scheduled Meds: . ipratropium  0.5 mg Nebulization TID   And  . albuterol  2.5 mg Nebulization TID  . allopurinol  100 mg Oral Daily  . amLODipine  10 mg Oral Daily  . cilostazol  100 mg Oral BID  . darifenacin  7.5 mg Oral Daily  . feeding supplement  237 mL Oral Q24H  . feeding supplement  30 mL Oral TID WC  . ferrous sulfate  325 mg Oral TID WC  . guaiFENesin  1,200 mg Oral BID  . hydrALAZINE  25 mg Oral Q8H  . insulin aspart  0-20 Units Subcutaneous TID WC  . insulin aspart  8 Units Subcutaneous TID WC  . insulin glargine  25 Units Subcutaneous QHS  . levothyroxine  150 mcg Oral QAC breakfast  . multivitamin with minerals  1 tablet Oral Daily  . pantoprazole  40 mg Oral Daily  . polyethylene glycol  17 g Oral BID  . senna-docusate  1 tablet Oral BID    Continuous Infusions:    Brynda Greathouse, MS RD LDN Clinical Inpatient Dietitian Pager: [phone]  Weekend/After hours pager: [phone]

## 2013-02-18 NOTE — Progress Notes (Signed)
2 Days Post-Op  Subjective: Up to bathroom, still very tender.  Objective: Vital signs in last 24 hours: Temp:  [97.7 F (36.5 C)-98.2 F (36.8 C)] 98.2 F (36.8 C) (05/14 [id number]) Pulse Rate:  [91-137] 108 (05/14 0638) Resp:  [18] 18 (05/14 0638) BP: (122-166)/(46-90) 158/90 mmHg (05/14 0638) SpO2:  [88 %-99 %] 98 % (05/14 0733) Last BM Date: 02/18/13 Diet: Carb modified Afebrile, BP up some, VSS. Labs stable  Intake/Output from previous day: 05/13 0701 - 05/14 0700 In: 828.2 [P.O.:600] Out: 2325 [Urine:2325] Intake/Output this shift: Total I/O In: 240 [P.O.:240] Out: 551 [Urine:550; Stool:1]  General appearance: alert, cooperative, no distress and anxious Skin: Open perirectal area is clean.  I had to put the drains back in place, she had just had a BM.  The site looks very good.  Lab Results:   Recent Labs  02/17/13 0530 02/18/13 0500  WBC 9.2 8.0  HGB 8.3* 8.6*  HCT 26.0* 27.8*  PLT 366 373    BMET  Recent Labs  02/17/13 0530 02/18/13 0500  NA 138 141  K 4.1 4.0  CL 100 102  CO2 32 34*  GLUCOSE 193* 119*  BUN 10 9  CREATININE 0.98 0.95  CALCIUM 8.6 8.7   PT/INR No results found for this basename: LABPROT, INR,  in the last 72 hours  No results found for this basename: AST, ALT, ALKPHOS, BILITOT, PROT, ALBUMIN,  in the last 168 hours   Lipase     Component Value Date/Time   LIPASE 17 02/02/2013 0850     Studies/Results: No results found.  Medications: . ipratropium  0.5 mg Nebulization TID   And  . albuterol  2.5 mg Nebulization TID  . allopurinol  100 mg Oral Daily  . amLODipine  10 mg Oral Daily  . cilostazol  100 mg Oral BID  . darifenacin  7.5 mg Oral Daily  . feeding supplement  237 mL Oral Q24H  . feeding supplement  30 mL Oral TID WC  . ferrous sulfate  325 mg Oral TID WC  . guaiFENesin  1,200 mg Oral BID  . hydrALAZINE  25 mg Oral Q8H  . insulin aspart  0-20 Units Subcutaneous TID WC  . insulin aspart  8 Units Subcutaneous  TID WC  . insulin glargine  25 Units Subcutaneous QHS  . levothyroxine  150 mcg Oral QAC breakfast  . multivitamin with minerals  1 tablet Oral Daily  . pantoprazole  40 mg Oral Daily  . polyethylene glycol  17 g Oral BID  . senna-docusate  1 tablet Oral BID    Assessment/Plan left buttock abscess, left breast abscess  S/p INCISION AND DRAINAGE L BUTTOCK ABSCESS AND L BREAST ABSCESS, 02/04/2013, Harl Bowie, MD  IRRIGATION AND DEBRIDEMENT LEFT BUTTOCK ABSCESS AND DRESSING CHANGE, 02/06/2013, 02/07/13, Harl Bowie, MD  Perirectal and gluteal necrotizing soft tissue infection abscess, IRRIGATION AND DEBRIDEMENT ABSCESS/DRESSING CHANGE, Gwenyth Ober, MD.02/08/2013  Irrigation and debridement of left perirectal, peroneal, and gluteal regions with placement of Penrose drains, 02/10/2013, [name]. Tsuei, MD  performed: Irrigation and debridement of left perirectal, peroneal, and gluteal regions with placement of Penrose drains  Surgeon:TSUEI,MATTHEW K. 02/12/2013.  dressing change, 02/14/2013 Harl Bowie, MD  IRRIGATION AND DEBRIDEMENT PERINEAL ABSCESS, DRESSING CHANGE UNDER ANESTHESIA  02/16/2013, Zenovia Jarred, MD.   Sepsis: 8 trips to OR for dressing change of her abscess.  FEW MICROAEROPHILIC STREPTOCOCCI Note: Standardized susceptibility testing for this organism is not available.  Day 15 of Zosyn (stopped 02/17/13) DM (diabetes mellitus) Poor control  CKD (chronic kidney disease), stage III  Cellulitis  Morbid obesity Body mass index is 59.9  ARF (acute renal failure)  Acute blood loss anemia  HTN (hypertension)  COPD/Tobacco use  Sleep apnea, she does not use her CPAP at home.     Plan;  D/c foley, continue to mobilize.  She needs to be mobilized, and bid dressing changes.  I think we could even start letting her shower in this area. She does have a PICC line so just showering would not work.  LOS: 16 days    Jeffren Dombek 02/18/2013

## 2013-02-19 LAB — GLUCOSE, CAPILLARY
Glucose-Capillary: 161 mg/dL — ABNORMAL HIGH (ref 70–99)
Glucose-Capillary: 176 mg/dL — ABNORMAL HIGH (ref 70–99)
Glucose-Capillary: 194 mg/dL — ABNORMAL HIGH (ref 70–99)

## 2013-02-19 MED ORDER — OXYCODONE HCL 5 MG PO TABS: 20.0000 mg | ORAL_TABLET | ORAL | Status: DC | PRN

## 2013-02-19 MED ORDER — OXYCODONE HCL 5 MG PO TABS
5.0000 mg | ORAL_TABLET | ORAL | Status: DC | PRN
  Administered 2013-02-19 (×2): 20 mg via ORAL
  Administered 2013-02-19: 10 mg via ORAL
  Administered 2013-02-20 (×3): 20 mg via ORAL
  Filled 2013-02-19: qty 4
  Filled 2013-02-19: qty 2
  Filled 2013-02-19 (×4): qty 4

## 2013-02-19 NOTE — Clinical Social Work Psychosocial (Signed)
     Clinical Social Work Department BRIEF PSYCHOSOCIAL ASSESSMENT 02/19/2013  Patient:  [name] [name]     Account Number:  [id number]     Admit date:  02/02/2013  Clinical Social Worker:  Otilio Saber  Date/Time:  02/19/2013 03:25 PM  Referred by:  RN  Date Referred:  02/19/2013 Referred for  SNF Placement   Other Referral:   Interview type:  Patient Other interview type:    PSYCHOSOCIAL DATA Living Status:  ALONE Admitted from facility:   Level of care:   Primary support name:  Tayona Beckert Primary support relationship to patient:  SPOUSE Degree of support available:   adequate, but husband does not live with patient.    CURRENT CONCERNS Current Concerns  Post-Acute Placement   Other Concerns:    SOCIAL WORK ASSESSMENT / PLAN Clincial Social Worker received referral for short term SNF placement at discharge. CSW introduced self and explained reason for visit. CSW explained SNF process and provided SNF list. Patient reported that she prefers to stay within [facility] if at all possible. CSW will initiate SNF search in [facility].    CSW will complete fl2 for MD's signature and continue to follow and update patient when bed offers are made.   Assessment/plan status:  Psychosocial Support/Ongoing Assessment of Needs Other assessment/ plan:   Information/referral to community resources:   SNF packet    PATIENTS/FAMILYS RESPONSE TO PLAN OF CARE: Patient reported that she is agreeable for SNF placement at discharge and is hopeful to stay within [facility]. Patient was appreciative of CSW's visit and assistance.

## 2013-02-19 NOTE — Progress Notes (Signed)
[facility] PROGRESS NOTE  [name] [name] [id number] DOB: [birth year] DOA: 02/02/2013 PCP: [name], MD  Assessment/Plan: 1. Sepsis/Perirectal and gluteal necrotizing abscess: Patient presented with a boil of her L buttock/perineal region for 1 week which had progressively increased in size and was very painful, associatyed with chills. Patient is s/p irrigation and debridement of left perirectal, peroneal, and gluteal regions with placement of Penrose drains by Dr Georgette Dover on 02/12/13, dressings change in OR on 02/14/13 and dressing change in OR on 02/16/13. Surgical recs to hold antibiotics Foley catheter has been reinserted to prevent contamination of dressings to 2. DM: Patient has known insulin-requiring DM-2. Managing currently with Lantus/SSI, as patient has had poor appetite, and there was concern for the possiblity of hypoglycemia with 70/30, which has been discontinued. Patient continues on Lantus.  3. ARF on CKD stage III: Patient is a peak creatinine of 2.79. This has since resolved.  4. Gout: Asymptomatic. Continued on Allopurinol. Stable  5. Hypertension: Stable  6. Constipation: Reports multiple bowel movements today.  7. Diarrhea: Transient/Resolved.  8. Hyponatremia: Resolved  9. Hypokalemia: Repleted as indicated/Resolved  10. Cough: Resolved.  11. Anemia: HB at presentation was 11.4, but has drifted down during the course of hospitalization, presumably due to ABLA from repeaated surgeries. On 02/14/13, HB was 7.7. Transfused total of 2 units PRBC and HB was reasonable at 8.8 on 02/16/13, resolved w/ transfusion. 12. Disposition: Awaiting possible placement to skilled nursing facility   Code Status: Full Family Communication: Patient in room (indicate person spoken with, relationship, and if by phone, the number) Disposition Plan: Pending  Skilled nursing facility   Consultants:  Surgery  Procedures:  Irrigation and debridement of perineal abscess, dressing  change under anesthesia on 02/16/13    HPI/Subjective: Patient complains of intermittent pain of moderate severity partially improved with oral opioids. Objective: Filed Vitals:   02/18/13 2100 02/19/13 0624 02/19/13 0632 02/19/13 0750  BP: 151/87 167/80    Pulse: 113 96    Temp: 98.5 F (36.9 C) 98.1 F (36.7 C)    TempSrc: Oral Oral    Resp: 14 20    Height:      Weight:   154.6 kg (340 lb 13.3 oz)   SpO2: 100% 98%  98%    Intake/Output Summary (Last 24 hours) at 02/19/13 1139 Last data filed at 02/19/13 0819  Gross per 24 hour  Intake     10 ml  Output   1701 ml  Net  -1691 ml   Filed Weights   02/11/13 0506 02/13/13 0512 02/19/13 [id number]  Weight: 154.3 kg (340 lb 2.7 oz) 158.5 kg (349 lb 6.9 oz) 154.6 kg (340 lb 13.3 oz)    Exam:   General:  Patient's awake in no apparent distress  Cardiovascular: Regular S1-S2 no murmurs  Respiratory: Normal respiratory effort no crackles no wheezing  Abdomen: Morbidly obese soft nontender positive bowel sounds  Musculoskeletal: Well perfused no clubbing or cyanosis   Data Reviewed: Basic Metabolic Panel:  Recent Labs Lab 02/14/13 0436 02/15/13 0500 02/16/13 0500 02/17/13 0530 02/18/13 0500  NA 138 137 137 138 141  K 4.0 4.0 4.0 4.1 4.0  CL 103 101 101 100 102  CO2 30 29 32 32 34*  GLUCOSE 173* 168* 185* 193* 119*  BUN 9 11 10 10 9   CREATININE 0.93 0.99 0.99 0.98 0.95  CALCIUM 8.3* 8.6 8.6 8.6 8.7   Liver Function Tests: No results found for this basename: AST, ALT, ALKPHOS, BILITOT, PROT, ALBUMIN,  in the last 168 hours No results found for this basename: LIPASE, AMYLASE,  in the last 168 hours No results found for this basename: AMMONIA,  in the last 168 hours CBC:  Recent Labs Lab 02/14/13 0436 02/15/13 0500 02/16/13 0500 02/17/13 0530 02/18/13 0500  WBC 16.0* 12.7* 10.8* 9.2 8.0  HGB 7.7* 8.0* 8.8* 8.3* 8.6*  HCT 24.5* 24.6* 27.4* 26.0* 27.8*  MCV 83.1 84.0 83.8 85.8 86.6  PLT 347 344 355 366 373    Cardiac Enzymes: No results found for this basename: CKTOTAL, CKMB, CKMBINDEX, TROPONINI,  in the last 168 hours BNP (last 3 results) No results found for this basename: PROBNP,  in the last 8760 hours CBG:  Recent Labs Lab 02/18/13 0749 02/18/13 1138 02/18/13 1720 02/18/13 2143 02/19/13 0732  GLUCAP 105* 148* 147* 163* 161*    No results found for this or any previous visit (from the past 240 hour(s)).   Studies: No results found.  Scheduled Meds: . ipratropium  0.5 mg Nebulization TID   And  . albuterol  2.5 mg Nebulization TID  . allopurinol  100 mg Oral Daily  . amLODipine  10 mg Oral Daily  . cilostazol  100 mg Oral BID  . darifenacin  7.5 mg Oral Daily  . feeding supplement  237 mL Oral Q24H  . ferrous sulfate  325 mg Oral TID WC  . guaiFENesin  1,200 mg Oral BID  . hydrALAZINE  25 mg Oral Q8H  . insulin aspart  0-20 Units Subcutaneous TID WC  . insulin aspart  8 Units Subcutaneous TID WC  . insulin glargine  25 Units Subcutaneous QHS  . levothyroxine  150 mcg Oral QAC breakfast  . multivitamin with minerals  1 tablet Oral Daily  . pantoprazole  40 mg Oral Daily  . polyethylene glycol  17 g Oral BID  . senna-docusate  1 tablet Oral BID   Continuous Infusions:   Principal Problem:   Sepsis Active Problems:   DM (diabetes mellitus)   CKD (chronic kidney disease), stage III   Cellulitis   Morbid obesity   ARF (acute renal failure)   Diarrhea   Hyponatremia   Hypokalemia   Cellulitis and abscess of buttock   Acute blood loss anemia   HTN (hypertension)    Time spent: 20 minutes    [facility], [facility] Pager [phone]. If 7PM-7AM, please contact night-coverage at [email or web address], password [facility] 02/19/2013, 11:39 AM  LOS: 17 days

## 2013-02-19 NOTE — Progress Notes (Signed)
Notified Dr. Georgette Dover that pt dressing getting saturated with urine and falling out every time pt urinates and that dressing including packing has been changed 3 times since 7 pm. Received order to replace foley catheter.

## 2013-02-19 NOTE — Clinical Social Work Placement (Addendum)
    Clinical Social Work Department CLINICAL SOCIAL WORK PLACEMENT NOTE 02/19/2013  Patient:  [name] [name],[name] [name]  Account Number:  [id number] Admit date:  02/02/2013  Clinical Social Worker:  Leandro Reasoner, Latanya Presser  Date/time:  02/19/2013 04:17 PM  Clinical Social Work is seeking post-discharge placement for this patient at the following level of care:   [place]   (*CSW will update this form in Epic as items are completed)   02/19/2013  Patient/family provided with [place] Department of Clinical Social Work's list of facilities offering this level of care within the geographic area requested by the patient (or if unable, by the patient's family).  02/19/2013  Patient/family informed of their freedom to choose among providers that offer the needed level of care, that participate in Medicare, Medicaid or managed care program needed by the patient, have an available bed and are willing to accept the patient.  02/19/2013  Patient/family informed of [facility]' ownership interest in [facility], as well as of the fact that they are under no obligation to receive care at this facility.  PASARR submitted to EDS on 02/19/2013 PASARR number received from EDS on 02/19/2013  FL2 transmitted to all facilities in geographic area requested by pt/family on  02/19/2013 FL2 transmitted to all facilities within larger geographic area on   Patient informed that his/her managed care company has contracts with or will negotiate with  certain facilities, including the following:     Patient/family informed of bed offers received:  02-20-13 Patient chooses bed at [facility] Physician recommends and patient chooses bed at    Patient to be transferred to [facility] on 02-20-13   Patient to be transferred to facility by [facility]  The following physician request were entered in Epic:   Additional Comments:

## 2013-02-19 NOTE — Progress Notes (Signed)
Wound care.  PA removed penroses. Patient examined and I agree with the assessment and plan  Georganna Skeans, MD, MPH, FACS Pager: [phone]  02/19/2013 1:50 PM

## 2013-02-19 NOTE — Progress Notes (Signed)
3 Days Post-Op  Subjective: No real change, she could not keep foley out, because she would soil the dressing each time she voided.  She is contaminating would with stool at each BM also.  She still has allot of discomfort with dressing changes, but is tolerating it.  Objective: Vital signs in last 24 hours: Temp:  [98.1 F (36.7 C)-98.5 F (36.9 C)] 98.1 F (36.7 C) (05/15 0624) Pulse Rate:  [88-113] 96 (05/15 0624) Resp:  [14-20] 20 (05/15 0624) BP: (138-167)/(70-87) 167/80 mmHg (05/15 0624) SpO2:  [92 %-100 %] 98 % (05/15 0750) Weight:  [154.6 kg (340 lb 13.3 oz)] 154.6 kg (340 lb 13.3 oz) (05/15 [id number]) Last BM Date: 02/18/13 2 stools recorded. Foley replaced because of she would soak dressing with each voiding and required new dressing change. Afebrile, VSS,  BP up some. No labs glucose 105-160's Intake/Output from previous day: 05/14 0701 - 05/15 0700 In: 240 [P.O.:240] Out: 2427 [Urine:2425; Stool:2] Intake/Output this shift: Total I/O In: 10 [I.V.:10] Out: -   General appearance: alert, cooperative, no distress and very uncomfortable still. Skin: I have found the best way to do dressing change is with her lying on her abdomen.  She had just had a BM, so there was some stool contamination of the wound.  We took the drains out and cleaned her site as well as we could.  I then repacked the site.  it is clean and beefy red.  Lab Results:   Recent Labs  02/17/13 0530 02/18/13 0500  WBC 9.2 8.0  HGB 8.3* 8.6*  HCT 26.0* 27.8*  PLT 366 373    BMET  Recent Labs  02/17/13 0530 02/18/13 0500  NA 138 141  K 4.1 4.0  CL 100 102  CO2 32 34*  GLUCOSE 193* 119*  BUN 10 9  CREATININE 0.98 0.95  CALCIUM 8.6 8.7   PT/INR No results found for this basename: LABPROT, INR,  in the last 72 hours  No results found for this basename: AST, ALT, ALKPHOS, BILITOT, PROT, ALBUMIN,  in the last 168 hours   Lipase     Component Value Date/Time   LIPASE 17 02/02/2013 0850      Studies/Results: No results found.  Medications: . ipratropium  0.5 mg Nebulization TID   And  . albuterol  2.5 mg Nebulization TID  . allopurinol  100 mg Oral Daily  . amLODipine  10 mg Oral Daily  . cilostazol  100 mg Oral BID  . darifenacin  7.5 mg Oral Daily  . feeding supplement  237 mL Oral Q24H  . ferrous sulfate  325 mg Oral TID WC  . guaiFENesin  1,200 mg Oral BID  . hydrALAZINE  25 mg Oral Q8H  . insulin aspart  0-20 Units Subcutaneous TID WC  . insulin aspart  8 Units Subcutaneous TID WC  . insulin glargine  25 Units Subcutaneous QHS  . levothyroxine  150 mcg Oral QAC breakfast  . multivitamin with minerals  1 tablet Oral Daily  . pantoprazole  40 mg Oral Daily  . polyethylene glycol  17 g Oral BID  . senna-docusate  1 tablet Oral BID    Assessment/Plan left buttock abscess, left breast abscess  S/p INCISION AND DRAINAGE L BUTTOCK ABSCESS AND L BREAST ABSCESS, 02/04/2013, Harl Bowie, MD  IRRIGATION AND DEBRIDEMENT LEFT BUTTOCK ABSCESS AND DRESSING CHANGE, 02/06/2013, 02/07/13, Harl Bowie, MD  Perirectal and gluteal necrotizing soft tissue infection abscess, IRRIGATION AND DEBRIDEMENT ABSCESS/DRESSING CHANGE, Jeneen Rinks  Michael Boston, MD.02/08/2013  Irrigation and debridement of left perirectal, peroneal, and gluteal regions with placement of Penrose drains, 02/10/2013, [name]. Tsuei, MD  performed: Irrigation and debridement of left perirectal, peroneal, and gluteal regions with placement of Penrose drains  Surgeon:TSUEI,MATTHEW K. 02/12/2013.  dressing change, 02/14/2013 Harl Bowie, MD  IRRIGATION AND DEBRIDEMENT PERINEAL ABSCESS, DRESSING CHANGE UNDER ANESTHESIA  02/16/2013, Zenovia Jarred, MD.  Sepsis: 8 trips to OR for dressing change of her abscess.  FEW MICROAEROPHILIC STREPTOCOCCI Note: Standardized susceptibility testing for this organism is not available.   Day 15 of Zosyn (stopped 02/17/13)  DM (diabetes mellitus) Poor control  CKD  (chronic kidney disease), stage III  Cellulitis  Morbid obesity Body mass index is 59.9  ARF (acute renal failure)  Acute blood loss anemia  HTN (hypertension)  COPD/Tobacco use  Sleep apnea, she does not use her CPAP at home.  Plan:  I am going to have her shower and use hand held shower to clean and irrigate the site BID and with each BM.  She needs at least BID dressing changes, but with her going to the bathroom I think it will be more.  She needs SNF for this;  At least for now.  I have shown nurse how to place packing into undermined areas.  She can go to SNF from our standpoint and follow up in the office in 1-2 weeks with Dr. Ninfa Linden. I have placed discharge follow up and wound care instructions in the AVS for her once she is placed in SNF.       LOS: 17 days    [name] [name] 02/19/2013

## 2013-02-20 LAB — GLUCOSE, CAPILLARY
Glucose-Capillary: 171 mg/dL — ABNORMAL HIGH (ref 70–99)
Glucose-Capillary: 181 mg/dL — ABNORMAL HIGH (ref 70–99)

## 2013-02-20 MED ORDER — CLINDAMYCIN PHOSPHATE 1 % EX GEL
Freq: Three times a day (TID) | CUTANEOUS | Status: DC
  Administered 2013-02-20: 15:00:00 via TOPICAL
  Filled 2013-02-20 (×2): qty 30

## 2013-02-20 MED ORDER — FERROUS SULFATE 325 (65 FE) MG PO TABS: 325.0000 mg | ORAL_TABLET | Freq: Three times a day (TID) | ORAL | Status: DC

## 2013-02-20 MED ORDER — HYDRALAZINE HCL 25 MG PO TABS: 25.0000 mg | ORAL_TABLET | Freq: Three times a day (TID) | ORAL | Status: DC

## 2013-02-20 MED ORDER — POLYETHYLENE GLYCOL 3350 17 G PO PACK: 17.0000 g | PACK | Freq: Two times a day (BID) | ORAL | Status: DC

## 2013-02-20 MED ORDER — SENNOSIDES-DOCUSATE SODIUM 8.6-50 MG PO TABS: 1.0000 | ORAL_TABLET | Freq: Two times a day (BID) | ORAL | Status: DC

## 2013-02-20 MED ORDER — OXYCODONE-ACETAMINOPHEN 10-325 MG PO TABS: 1.0000 | ORAL_TABLET | ORAL | Status: DC | PRN

## 2013-02-20 MED ORDER — CLINDAMYCIN PHOSPHATE 1 % EX GEL: Freq: Three times a day (TID) | CUTANEOUS | Status: DC

## 2013-02-20 NOTE — Discharge Summary (Signed)
Physician Discharge Summary  Luz Reitmeier [id number] DOB: [birth year] DOA: 02/02/2013  PCP: [name], MD  Admit date: 02/02/2013 Discharge date: 02/20/2013  Time spent: 25 minutes  Recommendations for Outpatient Follow-up:  1. Followup with Dr. Grandville Silos and scheduled 2. Follow up with your primary care provider in 1-2 weeks  Discharge Diagnoses:  Principal Problem:   Sepsis Active Problems:   DM (diabetes mellitus)   CKD (chronic kidney disease), stage III   Cellulitis   Morbid obesity   ARF (acute renal failure)   Diarrhea   Hyponatremia   Hypokalemia   Cellulitis and abscess of buttock   Acute blood loss anemia   HTN (hypertension)   Discharge Condition: Improved  Diet recommendation: Diabetic  Filed Weights   02/11/13 0506 02/13/13 0512 02/19/13 [id number]  Weight: 154.3 kg (340 lb 2.7 oz) 158.5 kg (349 lb 6.9 oz) 154.6 kg (340 lb 13.3 oz)    History of present illness:  Joniqua Lukins is a 62 y.o. female with PMH of DM, Obesity, HTN, CKD 3, presents from home today with diarrhea and the gluteal boil .  She c/o diarrhea for several days prior to admission, it is non bloodly and non bilious, associated with lower abdominal cramps, she had 2 episodes today thus far, h/o recent antibiotic use was 2-3 weeks ago for UTI.  In addition she also noted a boil in her L buttock /perineal region 1 week prior to admission, this has progressively increased in size and is very painful, she denies any drainage from it.  She denies any fevers, reports chills.  Upon evaluation in the ER she was noted to be tachycardic with HR of 130s, BP as low as 73/49 which improved with IVF to low 100s, WBC of 23K and electrolyte abormalities   Hospital Course:  1. Sepsis/Perirectal and gluteal necrotizing abscess: Patient presented with a boil of her L buttock/perineal region for 1 week which had progressively increased in size and was very painful, associatyed with chills. In the ER she was  noted to be tachycardic with HR 130s, BP was low at 73/49 (improved with IVF to low 100s), WBC was 23, consistent with SIRS/Sepsis. She was initially managed with [name] [name], then Zyvox was substituted for vancomycin, due to developing ARF. Dr [name] provided surgical consultation, and patient is s/p I&D in OR per Gen Surg 02/04/13, 02/06/13 , 02/08/13, and 02/10/13. Culture grew microaerophilic strept. Clinically, sepsis has resolved, and Zyvox was discontinued on 02/09/13.  The patient was ultimately continued on another 16 days of Zosyn which was later discontinued per surgery recommendations. Patient is s/p irrigation and debridement of left perirectal, peroneal, and gluteal regions with placement of Penrose drains by Dr Georgette Dover on 02/12/13, dressings change in OR on 02/14/13 and dressing change in OR on 02/16/13. Patient will followup with surgery upon hospital discharge. 2. DM: Patient has known insulin-requiring DM-2. Managing currently with Lantus/SSI, as patient has had poor appetite, and there was concern for the possiblity of hypoglycemia with 70/30, which had been discontinued temporarily. The patient resume her home regimen upon discharge. 3. ARF on CKD stage III: Unfortunately, baseline creatinine is unknown. Patient had a creatinine of 1.77 at presentation, but this climbed to a maximum of 2.79 by 02/05/13, likely due to a combination of prerenal and ATN (contrast-induced nephropathy). Dr Katrine Coho provided nephrology consultation. Managed as recommended, and as of 02/08/13, creatinine had normalized at 1.20. As of 02/10/13, creatinine was 1.01. The creatinine has remained stable through the remainder  of the hospital course 4. Abnormal fluid and calcium collection in pelvis (noted via CT scan): This was an incidental finding, of unclear etiology. MRI of 02/06/13, showed interval resolution of intra pelvic fluid collection.  5. Gout: Asymptomatic. Continued on Allopurinol.  6. Hypertension:  Uncontrolled initially. Managing with Norvasc, started on 02/08/13, with satisfactory control.  7. Constipation: Likely due to opioid analgesics. Managing with Miralax and Docusate. KUB pattern suggestive of ileum.  7. Diarrhea: Transient/Resolved.  8. Hyponatremia: Likely related to her acute renal failure as well as volume depletion/Resolved.  9. Hypokalemia: Resolved 10. Cough: Patient has a mild cough, productive of occasionally brownish phlegm. Chest is clear. Suspect repeated GA/oral airway, contributory. Added Mucinex on 59/14. Surgical team has commenced incentive spirometry. As of 02/16/13, cough had resolved.  11. Anemia: HB at presentation was 11.4, but has drifted down during the course of hospitalization, presumably due to ABLA from repeaated surgeries. On 02/14/13, HB was 7.7. Transfused total of 2 units PRBC and HB was reasonable at 8.8 on 02/16/13, and has remained stable. The patient was started on iron replacement.  Procedures:  I&D in OR per Gen Surg 02/04/13, 02/06/13 , 02/08/13, and 02/10/13  Consultations:  Dr. Grandville Silos of surgery  Dr. Arty Baumgartner of nephrology  Discharge Exam: Filed Vitals:   02/19/13 2055 02/20/13 0539 02/20/13 0828 02/20/13 1037  BP: 162/75 174/89  135/70  Pulse: 110 120  126  Temp: 99.1 F (37.3 C)     TempSrc: Oral     Resp: 18 18  20   Height:      Weight:      SpO2: 100% 100% 100% 100%    General: Patient is awake in no apparent Cardiovascular: Regular S1-S2 no murmurs Respiratory: Normal respiratory effort no crackles no wheezing  Discharge Instructions     Medication List    ASK your doctor about these medications       albuterol 108 (90 BASE) MCG/ACT inhaler  Commonly known as:  PROVENTIL HFA;VENTOLIN HFA  Inhale 2 puffs into the lungs every 6 (six) hours as needed for wheezing or shortness of breath.     allopurinol 100 MG tablet  Commonly known as:  ZYLOPRIM  Take 100 mg by mouth daily.     amLODipine 10 MG tablet  Commonly  known as:  NORVASC  Take 10 mg by mouth daily.     B-complex with vitamin C tablet  Take 1 tablet by mouth 2 (two) times daily.     cilostazol 100 MG tablet  Commonly known as:  PLETAL  take 1 tablet by mouth twice a day     colchicine 0.6 MG tablet  Take 0.6 mg by mouth 2 (two) times daily.     diclofenac sodium 1 % Gel  Commonly known as:  VOLTAREN  Apply 2 g topically 2 (two) times daily as needed (for pain).     esomeprazole 40 MG capsule  Commonly known as:  NEXIUM  Take 40 mg by mouth daily before breakfast.     fluticasone 50 MCG/ACT nasal spray  Commonly known as:  FLONASE  Place 2 sprays into the nose as needed for allergies.     hydrochlorothiazide 12.5 MG capsule  Commonly known as:  MICROZIDE  Take 12.5 mg by mouth daily.     insulin aspart protamine- aspart (70-30) 100 UNIT/ML injection  Commonly known as:  NOVOLOG 70/30  Inject into the skin 2 (two) times daily with a meal.     NOVOLOG MIX 70/30  FLEXPEN [place]  Inject 30-40 Units into the skin 2 (two) times daily with a meal. take 40 units in the morning and 30 units in the evening     levothyroxine 150 MCG tablet  Commonly known as:  SYNTHROID, LEVOTHROID  Take 150 mcg by mouth daily.     meloxicam 15 MG tablet  Commonly known as:  MOBIC  Take 15 mg by mouth daily.     multivitamin with minerals Tabs  Take 1 tablet by mouth daily.     oxyCODONE-acetaminophen 5-325 MG per tablet  Commonly known as:  PERCOCET/ROXICET  Take 1 tablet by mouth every 4 (four) hours as needed for pain.     solifenacin 5 MG tablet  Commonly known as:  VESICARE  Take 5 mg by mouth daily.       Allergies  Allergen Reactions  . Ace Inhibitors     Tongue swell  . Morphine And Related Itching       Follow-up Information   Follow up with [facility], MD. Schedule an appointment as soon as possible for a visit in 1 week. (Make a follow up appointment for 1-2 weeks after discharge from hospital .  Continue twice  daily and as needed dressing changes to abscess cavity.)    Contact information:   [place] [id number] [phone]       Follow up with [name], MD In 2 weeks.   Contact information:   [place] [address] [phone]        The results of significant diagnostics from this hospitalization (including imaging, microbiology, ancillary and laboratory) are listed below for reference.    Significant Diagnostic Studies: Dg Chest 2 View  02/02/2013   *RADIOLOGY REPORT*  Clinical Data: 62 year old female with shortness of breath cough low grade fever diarrhea.  CHEST - 2 VIEW  Comparison: 07/31/2011 and earlier.  Findings: Semi upright AP and lateral views of the chest. Cardiomegaly not significantly changed. Other mediastinal contours are within normal limits.  Visualized tracheal air column is within normal limits.  No pneumothorax or pulmonary edema.  No pleural effusion or consolidation.  No confluent pulmonary opacity. No acute osseous abnormality identified.  IMPRESSION: Stable cardiomegaly.  No acute cardiopulmonary abnormality.   Original Report Authenticated By: Roselyn Reef, M.D.   Mr Pelvis Wo Contrast  02/07/2013   *RADIOLOGY REPORT*  Clinical Data: Follow-up pelvic fluid collection.  MRI PELVIS WITHOUT CONTRAST  Technique:  Multi-planar multi-sequence MR imaging of the pelvis was performed following the standard protocol. No intravenous contrast was administered.  Comparison: Pelvic CT 02/02/2013.  Findings: Study is mildly motion degraded, although sufficiently diagnostic.  A Foley catheter has been placed with decompression of the urinary bladder.  The previously demonstrated fluid collection is no longer present.  There is no significant free pelvic fluid. There are some dilated loops of small bowel within the false pelvis.  There is no evidence of pelvic mass or lymphadenopathy.  Subcutaneous edema is present within the anterior  pelvic soft tissues.  No focal hernia is identified. There is there is also altered signal within the subcutaneous fat in the inferior left gluteal region where there are two new focal areas of low T2 and intermediate T1 signal.  This is incompletely visualized on this examination and was not included on the prior pelvic CT.  Patient is status post right total hip arthroplasty with associated susceptibility artifact.  No acute osseous findings are identified. Mild  disc bulging is present in the lower lumbar spine.  IMPRESSION:  1.  Interval resolution of intra pelvic fluid collection. 2.  Subcutaneous edema anteriorly with possible atypical fluid or air collections in the inferior left gluteal region. Is there a decubitus ulcer in this area or clinical concern of perianal pathology?  This is incompletely visualized by the current examination and requires clinical correlation.   Follow-up pelvic CT with more inferior imaging may be helpful especially if this is the site of the palpable concern previously evaluated by ultrasound.   Original Report Authenticated By: Richardean Sale, M.D.   US Pelvis Limited  02/02/2013   *RADIOLOGY REPORT*  Clinical Data: Left peroneal pain.  Gluteal mass.  Abscess.  US PELVIS LIMITED OR FOLLOW UP  Comparison: CT 02/02/2013.  Findings: Scanning was per formed over the posterior peroneal region and left medial gluteal region.  Subcutaneous edema is present without focal fluid collection.  No abscess.  IMPRESSION: Negative for abscess.  Subcutaneous edema, probably representing cellulitis.   Original Report Authenticated By: Dereck Ligas, M.D.   Ct Abdomen Pelvis W Contrast  02/03/2013   **ADDENDUM** CREATED: 02/03/2013 17:18:17  The collection with calcifications in the pelvis is separate from the urinary bladder. Urinary bladder appears to be decompressed or compressed from this irregular fluid collection with calcifications.  Unfortunately, this area is very difficult to  evaluate due to artifact from the right hip replacement. This collection roughly measures 9.2 x 5.1 x 8.7 cm. There is significant edema in the subcutaneous tissues, particularly in the left gluteal region.  The differential diagnosis for this fluid collection includes an atypical abscess or hematoma. The high dense material probably represents calcifications and extravasated oral contrast is thought to be less likely but cannot be completely excluded.  Consider a follow-up pelvic CT without contrast to see if the etiology of this collection is more clear as the previously given oral contrast moves through the distal small bowel and colon.  These findings were discussed with Dr. Junious Silk of Urology and Dr. Wynelle Cleveland of Internal Medicine.  **END ADDENDUM** SIGNED BY: Markus Daft, M.D.  02/02/2013   *RADIOLOGY REPORT*  Clinical Data: Abdominal pain and diarrhea.  CT ABDOMEN AND PELVIS WITH CONTRAST  Technique:  Multidetector CT imaging of the abdomen and pelvis was performed following the standard protocol during bolus administration of intravenous contrast.  Contrast: 44mL OMNIPAQUE IOHEXOL 300 MG/ML  SOLN  Comparison: None.  Findings: Lung bases are clear.  No evidence for free intraperitoneal air.  No gross abnormality to the liver, portal venous system or spleen. The gallbladder appears to be absent.  No gross abnormality to the pancreas.  There is a diffuse thickening and nodularity in the left adrenal gland.  Lateral left adrenal nodule measures up to 2.7 cm.  There is perinephric edema bilaterally, right side greater than left.  There is an exophytic hyperdense structure along the posterior lower pole of the right kidney that measures 1.4 cm. There is an adjacent low density structure that measures 2.3 cm. Indeterminate 1.4 cm low density structure along the medial right kidney.  Suspect small cysts in the left kidney.  The patient has a right hip replacement that causes artifact in the pelvis.  However, there is  a large amount of high density material along the posterior aspect of the bladder which is concerning for multiple stones or calcifications.  No significant free fluid or lymphadenopathy in the abdomen or pelvis.  There is a small ventral hernia containing  fat.  No significant colon wall thickening.  No acute bony abnormality. There is subcutaneous edema in the left hemi pelvis.  There is no significant excretion from the kidneys on the delayed images.  The patient is noted to have creatinine level 1.77.  IMPRESSION: There is high density material along the posterior aspect of the bladder.  Differential diagnosis includes multiple bladder stones versus a calcified lesion.  Recommend urology consultation.  No excretion from the kidneys on the delayed images.  This finding along with the elevated creatinine level raises concern for renal function impairment and recommend following renal function to exclude worsening renal function or ATN.  There are multiple low-density structures involving both kidneys that could represent cysts.  There is an exophytic hyperdense structure in the right kidney lower pole which could represent a complex or hemorrhagic cyst.  However, these renal structures are indeterminate.  There is also nodularity and enlargement of the left adrenal gland which could represent hyperplasia or adenomas but indeterminate on this postcontrast examination.  The adrenal and renal findings could be more definitively evaluated with MRI.  Original Report Authenticated By: Markus Daft, M.D.   US Renal  02/03/2013   *RADIOLOGY REPORT*  Clinical Data: Acute renal insufficiency.  Diabetes.  RENAL/URINARY TRACT ULTRASOUND COMPLETE  Comparison:  CT 02/02/2013  Findings:  Right Kidney:  11.7 cm. No hydronephrosis.  Suspect mild increased renal echogenicity.  Normal renal cortical thickness.  2.6 cm interpolar right renal cyst.  Upper pole right renal cyst of 1.0 cm.  Left Kidney:  13.2 cm. No hydronephrosis.   Suspect mild increased echogenicity and decreased cortical thickness.  A 1.3 cm inter/upper pole lesion which is markedly hypoechoic to anechoic. Suboptimally evaluated.  Bladder:  Decompressed.  A complex cystic process which was evaluated on yesterday's CT is suboptimally evaluated today.  IMPRESSION:  1. No hydronephrosis. 2.  Findings of medical renal disease, mild. 3.  Probable bilateral renal cysts.  Left-sided renal lesion which is suboptimally evaluated, secondary to patient body habitus. 4.  Cystic pelvic process, as detailed on the 02/02/2013 CT. Please see that report.   Original Report Authenticated By: Abigail Miyamoto, M.D.   Dg Abd 2 Views  02/09/2013   *RADIOLOGY REPORT*  Clinical Data: Abdominal pain.  Constipation.  ABDOMEN - 2 VIEW  Comparison: CT abdomen and pelvis 02/02/2013.  Findings: No free intraperitoneal air is identified.  There is a large volume of stool in the ascending and transverse colon. Multiple gas filled, dilated loops of small bowel are identified. The patient appears to have a left inguinal hernia with a loop of colon within it.  IMPRESSION:  1.  Bowel gas pattern most suggestive of ileus. 2.  Likely left inguinal hernia.   Original Report Authenticated By: Orlean Patten, M.D.    Microbiology: No results found for this or any previous visit (from the past 240 hour(s)).   Labs: Basic Metabolic Panel:  Recent Labs Lab 02/14/13 0436 02/15/13 0500 02/16/13 0500 02/17/13 0530 02/18/13 0500  NA 138 137 137 138 141  K 4.0 4.0 4.0 4.1 4.0  CL 103 101 101 100 102  CO2 30 29 32 32 34*  GLUCOSE 173* 168* 185* 193* 119*  BUN 9 11 10 10 9   CREATININE 0.93 0.99 0.99 0.98 0.95  CALCIUM 8.3* 8.6 8.6 8.6 8.7   Liver Function Tests: No results found for this basename: AST, ALT, ALKPHOS, BILITOT, PROT, ALBUMIN,  in the last 168 hours No results found for this basename: LIPASE,  AMYLASE,  in the last 168 hours No results found for this basename: AMMONIA,  in the last 168  hours CBC:  Recent Labs Lab 02/14/13 0436 02/15/13 0500 02/16/13 0500 02/17/13 0530 02/18/13 0500  WBC 16.0* 12.7* 10.8* 9.2 8.0  HGB 7.7* 8.0* 8.8* 8.3* 8.6*  HCT 24.5* 24.6* 27.4* 26.0* 27.8*  MCV 83.1 84.0 83.8 85.8 86.6  PLT 347 344 355 366 373   Cardiac Enzymes: No results found for this basename: CKTOTAL, CKMB, CKMBINDEX, TROPONINI,  in the last 168 hours BNP: BNP (last 3 results) No results found for this basename: PROBNP,  in the last 8760 hours CBG:  Recent Labs Lab 02/19/13 0732 02/19/13 1206 02/19/13 1702 02/20/13 0741 02/20/13 1137  GLUCAP 161* 176* 194* 171* 181*       Signed:  CHIU, STEPHEN K  [facility] 02/20/2013, 1:47 PM

## 2013-02-20 NOTE — Clinical Social Work Note (Addendum)
Clinical Social Worker provided patient a list of bed offers received for SNF placement. CSW will check with patient later for confirmation on decision for SNF placement.   13:01pm CSW spoke with patient to see what facility she confirmed and she confirmed [facility] SNF who is able to accept patient today. CSW will notify MD.  14:10pm CSW facilitated discharge by contacting facility, [facility]. Patient will be transported via ambulance. Patient's discharge packet will be placed with shadow chart. CSW will sign off, as social work intervention is no longer needed.  Leandro Reasoner MSW, [place]

## 2013-02-20 NOTE — Progress Notes (Signed)
Physical Therapy Treatment Patient Details Name: [name] [name] MRN: [id number] DOB: 1951-03-21 Today's Date: 02/20/2013 Time: [id number] PT Time Calculation (min): 29 min  PT Assessment / Plan / Recommendation Comments on Treatment Session  62 y.o. female admitted to [facility] for diarrhea and gluteal boil. She has since had the boil /abcess drained and has been back to the OR many times for I&D and dressing changes. Progressing slowly, but well. Anticipate d/c to SNF soon'    Follow Up Recommendations  SNF;Supervision for mobility/OOB     Does the patient have the potential to tolerate intense rehabilitation     Barriers to Discharge        Equipment Recommendations  Rolling walker with 5" wheels (bariatric)    Recommendations for Other Services    Frequency Min 2X/week   Plan Discharge plan needs to be updated;Frequency needs to be updated    Precautions / Restrictions Precautions Precautions: Fall Precaution Comments: monitor O2 sats with gait   Pertinent Vitals/Pain Buttock pain 5/10 throughout session; pt pre-medicated for pain    Mobility  Bed Mobility Bed Mobility: Rolling Left;Left Sidelying to Sit;Sit to Sidelying Left Rolling Left: 6: Modified independent (Device/Increase time) Left Sidelying to Sit: 5: Supervision Sit to Supine: HOB flat;4: Min assist Details for Bed Mobility Assistance: pt up to EOB with supervision due to air mattress and height of bed (for safety), returned to supine with assist for raising legs (for MD to examine pt's groin) and then up to sitting again Transfers Sit to Stand: With upper extremity assist;4: Min guard Stand to Sit: 5: Supervision;With upper extremity assist Details for Transfer Assistance: minguard due to pt pushing on RW with Lt hand and RW tipped slightly; PT to stabilize RW Ambulation/Gait Ambulation/Gait Assistance: 5: Supervision Ambulation Distance (Feet): 120 Feet Assistive device: Rolling walker Ambulation/Gait Assistance  Details: instructed to walk slowly focusing on controling her breath; vc for upright posture and safe use of RW Gait Pattern: Step-through pattern;Trunk flexed Gait velocity: less than 1.8 ft/sec indicating risk for recurrent falls General Gait Details: pt with standing rest break x1 for 30 sec to check SaO2 (97% on RA); stood on return to room to place lunch order x 3 minutes    Exercises Other Exercises Other Exercises: Discussed d/c plan, anticipated progress with activity and emphasized safety concerns with pt getting up alone (she has gone to [facility] by herself); pt did well with self-monitoring her energy/breath support today   PT Diagnosis:    PT Problem List:   PT Treatment Interventions:     PT Goals Acute Rehab PT Goals Pt will go Supine/Side to Sit: with modified independence;with HOB 0 degrees PT Goal: Supine/Side to Sit - Progress: Progressing toward goal Pt will go Sit to Stand: with modified independence PT Goal: Sit to Stand - Progress: Progressing toward goal Pt will Transfer Bed to Chair/Chair to Bed: with modified independence PT Transfer Goal: Bed to Chair/Chair to Bed - Progress: Progressing toward goal Pt will Ambulate: 16 - 50 feet;with modified independence;with least restrictive assistive device PT Goal: Ambulate - Progress: Progressing toward goal  Visit Information  Last PT Received On: 02/20/13 Assistance Needed: +1    Subjective Data  Subjective: Pt reports she is going to go to SNF Patient Stated Goal: to go to SNF for wound care and rehab, then home   Cognition  Cognition Arousal/Alertness: Awake/alert Behavior During Therapy: [facility] for tasks assessed/performed Overall Cognitive Status: Within Functional Limits for tasks assessed    Balance  End of Session PT - End of Session Activity Tolerance: Patient limited by fatigue Patient left: with call bell/phone within reach;in chair   GP     [name] [name] 02/20/2013, 9:48 AM Pager [phone]

## 2013-02-24 ENCOUNTER — Telehealth (INDEPENDENT_AMBULATORY_CARE_PROVIDER_SITE_OTHER): Payer: Self-pay

## 2013-02-24 NOTE — Telephone Encounter (Signed)
Nurse calling in asking to make an appt with Dr Ninfa Linden for this week since pt discharged from the hospital. I read the discharge note which stated the pt f/u with Dr Grandville Silos in the next 2wks but the pt thinks she is really supposed to see Dr Ninfa Linden. Please call pt to make appt.

## 2013-03-10 ENCOUNTER — Ambulatory Visit (INDEPENDENT_AMBULATORY_CARE_PROVIDER_SITE_OTHER): Payer: PRIVATE HEALTH INSURANCE | Admitting: Surgery

## 2013-03-10 ENCOUNTER — Encounter (INDEPENDENT_AMBULATORY_CARE_PROVIDER_SITE_OTHER): Payer: Self-pay | Admitting: Surgery

## 2013-03-10 VITALS — BP 132/72 | HR 112 | Temp 96.3°F | Resp 18

## 2013-03-10 DIAGNOSIS — Z09 Encounter for follow-up examination after completed treatment for conditions other than malignant neoplasm: Secondary | ICD-10-CM

## 2013-03-10 NOTE — Progress Notes (Signed)
Subjective:     Patient ID: [name] [name], female   DOB: 05-Feb-1951, 62 y.o.   MRN: [id number]  HPI She is here for a postop visit. She had a long hospital visit for severe left buttock abscess which required multiple debridements in the operating room. She is now in a rehabilitation facility undergoing wet-to-dry dressing changes twice daily. Currently she feels well.  Review of Systems     Objective:   Physical Exam Even though she is in a wheelchair, she looks much better than I expected. She moved herself on exam table very well. The wound is still large but shows excellent granulation tissue with no purulence or signs of infection    Assessment:     Patient doing well postop     Plan:     We will continue the twice daily wet to dry dressing changes and I will see her back in one month

## 2013-03-27 ENCOUNTER — Encounter (INDEPENDENT_AMBULATORY_CARE_PROVIDER_SITE_OTHER): Payer: Self-pay | Admitting: General Surgery

## 2013-03-27 ENCOUNTER — Ambulatory Visit (INDEPENDENT_AMBULATORY_CARE_PROVIDER_SITE_OTHER): Payer: PRIVATE HEALTH INSURANCE | Admitting: General Surgery

## 2013-03-27 VITALS — BP 160/92 | HR 102 | Temp 98.6°F | Resp 18 | Ht 65.0 in | Wt 305.0 lb

## 2013-03-27 DIAGNOSIS — L0231 Cutaneous abscess of buttock: Secondary | ICD-10-CM

## 2013-03-27 DIAGNOSIS — L03317 Cellulitis of buttock: Secondary | ICD-10-CM

## 2013-03-27 NOTE — Progress Notes (Signed)
Chief Complaint  Patient presents with  . Colon Polyps    thigh abscess    HISTORY:  [name] [name] is a 62 y.o. female who presents to clinic with a new mass on her buttock.  She is s/p an I&D that required a prolonged hospitalization.  She denies fevers.  Her blood glucose levels have been ranging between 150 and 250 per pt report.    Past Medical History  Diagnosis Date  . History of hip replacement, total   . Hx MRSA infection     abscess left groin  . Arthritis   . GERD (gastroesophageal reflux disease)   . Gout   . Diabetes mellitus   . Hypertension   . Thyroid disease        Past Surgical History  Procedure Laterality Date  . Thyroidectomy    . Abdominal hysterectomy    . Ventral hernia repair    . Laparoscopic cholecystectomy    . Forearm fracture surgery      Left arm  . Joint replacement    . Hernia repair      w/ mesh  . Incision and drainage abscess Left 02/04/2013    Procedure: INCISION AND DRAINAGE LEFT BUTTOCK ABSCESS; INCISION AND DRAINAGE LEFT BREAST ABSCESS;  Surgeon: Harl Bowie, MD;  Location: [facility];  Service: General;  Laterality: Left;  . Irrigation and debridement abscess N/A 02/06/2013    Procedure: IRRIGATION AND DEBRIDEMENT BUTTOCK ABSCESS AND DRESSING CHANGE;  Surgeon: Harl Bowie, MD;  Location: [facility];  Service: General;  Laterality: N/A;  . Irrigation and debridement abscess Left 02/08/2013    Procedure: IRRIGATION AND DEBRIDEMENT ABSCESS/DRESSING CHANGE;  Surgeon: Gwenyth Ober, MD;  Location: [facility];  Service: General;  Laterality: Left;  . Incision and drainage perirectal abscess Left 02/10/2013    Procedure: IRRIGATION AND DEBRIDEMENT OF BUTTOCK/PERINEAL ABSCESS;  Surgeon: [name]. Georgette Dover, MD;  Location: [facility];  Service: General;  Laterality: Left;  . Incision and drainage abscess N/A 02/12/2013    Procedure: INCISION AND DEBRIDEMENT BUTTOCK WOUND ;  Surgeon: [name]. Georgette Dover, MD;  Location: [facility];  Service: General;  Laterality: N/A;   . Incision and drainage abscess N/A 02/14/2013    Procedure: INCISION AND DRAINAGE/DRESSING CHANGE;  Surgeon: Harl Bowie, MD;  Location: [facility];  Service: General;  Laterality: N/A;  . Incision and drainage perirectal abscess N/A 02/16/2013    Procedure: IRRIGATION AND DEBRIDEMENT PERINEAL ABSCESS;  Surgeon: Zenovia Jarred, MD;  Location: [facility];  Service: General;  Laterality: N/A;      Current Outpatient Prescriptions  Medication Sig Dispense Refill  . albuterol (PROVENTIL HFA;VENTOLIN HFA) 108 (90 BASE) MCG/ACT inhaler Inhale 2 puffs into the lungs every 6 (six) hours as needed for wheezing or shortness of breath.      . allopurinol (ZYLOPRIM) 100 MG tablet Take 100 mg by mouth daily.        Marland Kitchen amLODipine (NORVASC) 10 MG tablet Take 10 mg by mouth daily.        . B Complex-C (B-COMPLEX WITH VITAMIN C) tablet Take 1 tablet by mouth 2 (two) times daily.      . cilostazol (PLETAL) 100 MG tablet take 1 tablet by mouth twice a day  60 tablet  11  . clindamycin (CLINDAGEL) 1 % gel Apply topically 3 (three) times daily.  30 g  0  . colchicine 0.6 MG tablet Take 0.6 mg by mouth 2 (two) times daily.        Marland Kitchen  diclofenac sodium (VOLTAREN) 1 % GEL Apply 2 g topically 2 (two) times daily as needed (for pain).      Marland Kitchen esomeprazole (NEXIUM) 40 MG capsule Take 40 mg by mouth daily before breakfast.        . ferrous sulfate 325 (65 FE) MG tablet Take 1 tablet (325 mg total) by mouth 3 (three) times daily with meals.  30 tablet  0  . fluticasone (FLONASE) 50 MCG/ACT nasal spray Place 2 sprays into the nose as needed for allergies.       . hydrALAZINE (APRESOLINE) 25 MG tablet Take 1 tablet (25 mg total) by mouth every 8 (eight) hours.  90 tablet  0  . hydrochlorothiazide (,MICROZIDE/HYDRODIURIL,) 12.5 MG capsule Take 12.5 mg by mouth daily.        . Insulin Aspart Prot & Aspart (NOVOLOG MIX 70/30 FLEXPEN [place]) Inject 30-40 Units into the skin 2 (two) times daily with a meal. take 40 units in the morning  and 30 units in the evening      . insulin aspart protamine-insulin aspart (NOVOLOG 70/30) (70-30) 100 UNIT/ML injection Inject into the skin 2 (two) times daily with a meal.        . levothyroxine (SYNTHROID, LEVOTHROID) 150 MCG tablet Take 150 mcg by mouth daily.        . meloxicam (MOBIC) 15 MG tablet Take 15 mg by mouth daily.        . Multiple Vitamin (MULTIVITAMIN WITH MINERALS) TABS Take 1 tablet by mouth daily.      Marland Kitchen oxyCODONE-acetaminophen (PERCOCET) 10-325 MG per tablet Take 1 tablet by mouth every 4 (four) hours as needed for pain.  30 tablet  0  . polyethylene glycol (MIRALAX / GLYCOLAX) packet Take 17 g by mouth 2 (two) times daily.  14 each  0  . senna-docusate (SENOKOT-S) 8.6-50 MG per tablet Take 1 tablet by mouth 2 (two) times daily.  30 tablet  0  . solifenacin (VESICARE) 5 MG tablet Take 5 mg by mouth daily.        No current facility-administered medications for this visit.     Allergies  Allergen Reactions  . Ace Inhibitors     Tongue swell  . Morphine And Related Itching      Family History  Problem Relation Age of Onset  . Diabetes Brother       History   Social History  . Marital Status: Married    Spouse Name: N/A    Number of Children: N/A  . Years of Education: N/A   Social History Main Topics  . Smoking status: Current Every Day Smoker -- 0.25 packs/day for 40 years    Types: Cigarettes  . Smokeless tobacco: None  . Alcohol Use: No  . Drug Use: No  . Sexually Active:    Other Topics Concern  . None   Social History Narrative  . None       REVIEW OF SYSTEMS - PERTINENT POSITIVES ONLY: Review of Systems - General ROS: negative for - chills or fever Gastrointestinal ROS: negative for - change in stools, diarrhea or nausea/vomiting Genito-Urinary ROS: no dysuria, trouble voiding, or hematuria  EXAM: Filed Vitals:   03/27/13 1537  BP: 160/92  Pulse: 102  Temp: 98.6 F (37 C)  Resp: 18    General appearance: alert and  cooperative Resp: clear to auscultation bilaterally Cardio: regular rate and rhythm GI: normal findings: soft, non-tender Large perineal wound with good granulation on inner thigh.  Fluctuant mass noted distal to this that is tender to palpation.  Another fluctuant mass noted on opposite thigh that is nontender to touch and not erythematous.    Procedure: I&D Surgeon: Marcello Moores Assistant: Alphonzo Severance After the risks and benefits were explained, verbal consent was obtained for above procedure  Anesthesia: none Diagnosis: abscess of L inner thigh Findings: Area infused with lidocaine.  Aspirated for culture.  Wound opened and large amount of purulent drainage evacuated.  Edges trimmed to facilitate packing.  Wound packed tightly with a 4x4 gauze   ASSESSMENT AND PLAN: ADABEL SWORDS is a 62 y.o. F with a h/o MRSA abscess, who presents to office with an enlarging mass near her previous abscess site.  This was a new abscess, which was drained and packed.  Pt to f/u with Dr Ninfa Linden on Mon.  Continue antibiotics.  Percocet for pain.  Will send wound drainage for culture.  Strict blood glucose control recommended to rehab facility.      Rosario Adie, MD Colon and Rectal Surgery / [facility]      Visit Diagnoses: 1. Cellulitis and abscess of buttock     Primary Care Physician: [name], MD

## 2013-03-27 NOTE — Patient Instructions (Signed)
See attached consult sheet

## 2013-03-30 ENCOUNTER — Ambulatory Visit (INDEPENDENT_AMBULATORY_CARE_PROVIDER_SITE_OTHER): Payer: PRIVATE HEALTH INSURANCE | Admitting: Surgery

## 2013-03-30 ENCOUNTER — Encounter (INDEPENDENT_AMBULATORY_CARE_PROVIDER_SITE_OTHER): Payer: Self-pay | Admitting: Surgery

## 2013-03-30 VITALS — BP 140/80 | HR 108 | Temp 97.6°F | Resp 18 | Ht 64.0 in | Wt 306.4 lb

## 2013-03-30 DIAGNOSIS — L03317 Cellulitis of buttock: Secondary | ICD-10-CM

## 2013-03-30 DIAGNOSIS — Z09 Encounter for follow-up examination after completed treatment for conditions other than malignant neoplasm: Secondary | ICD-10-CM

## 2013-03-30 DIAGNOSIS — L0231 Cutaneous abscess of buttock: Secondary | ICD-10-CM

## 2013-03-30 NOTE — Progress Notes (Signed)
Subjective:     Patient ID: [name] [name], female   DOB: [birth year], 62 y.o.   MRN: [id number]  HPI She is here for a followup from the incision and drainage of her wound on Friday. She is more comfortable today  Review of Systems     Objective:   Physical Exam On exam, all wounds are clean. There is no further purulence. The culture showed Escherichia coli.    Assessment:     Perirectal abscesses     Plan:     I believe her wound care can be moved from twice a day to daily. She will continue her antibiotics and followup with me on July 7

## 2013-03-31 LAB — WOUND CULTURE
Gram Stain: NONE SEEN
Gram Stain: NONE SEEN
Gram Stain: NONE SEEN

## 2013-04-13 ENCOUNTER — Encounter (INDEPENDENT_AMBULATORY_CARE_PROVIDER_SITE_OTHER): Payer: Self-pay | Admitting: Surgery

## 2013-04-13 ENCOUNTER — Ambulatory Visit (INDEPENDENT_AMBULATORY_CARE_PROVIDER_SITE_OTHER): Payer: PRIVATE HEALTH INSURANCE | Admitting: Surgery

## 2013-04-13 ENCOUNTER — Telehealth (INDEPENDENT_AMBULATORY_CARE_PROVIDER_SITE_OTHER): Payer: Self-pay | Admitting: General Surgery

## 2013-04-13 VITALS — BP 152/82 | HR 88 | Resp 18 | Ht 64.0 in | Wt 296.0 lb

## 2013-04-13 DIAGNOSIS — Z09 Encounter for follow-up examination after completed treatment for conditions other than malignant neoplasm: Secondary | ICD-10-CM

## 2013-04-13 NOTE — Telephone Encounter (Signed)
LMOM for patient to call back for apt with Dr Ninfa Linden

## 2013-04-13 NOTE — Progress Notes (Signed)
Subjective:     Patient ID: AFI CHESTANG, female   DOB: 15-Sep-1951, 62 y.o.   MRN: [id number]  HPI She is here for another wound check. She is doing well has no complaints. She is undergoing twice a day wet-to-dry dressing changes  Review of Systems     Objective:   Physical Exam The skin is starting to undermine somewhat but there is excellent granulation tissue and no evidence of infection    Assessment:     Patient stable postop     Plan:     We will continue the current wound care and I will see her back in 3 weeks

## 2013-04-16 ENCOUNTER — Encounter (INDEPENDENT_AMBULATORY_CARE_PROVIDER_SITE_OTHER): Payer: Self-pay | Admitting: Surgery

## 2013-04-16 ENCOUNTER — Ambulatory Visit (INDEPENDENT_AMBULATORY_CARE_PROVIDER_SITE_OTHER): Payer: PRIVATE HEALTH INSURANCE | Admitting: Surgery

## 2013-04-16 VITALS — BP 138/78 | HR 98 | Temp 98.2°F | Resp 16 | Ht 64.0 in | Wt 294.6 lb

## 2013-04-16 DIAGNOSIS — L02419 Cutaneous abscess of limb, unspecified: Secondary | ICD-10-CM | POA: Insufficient documentation

## 2013-04-16 NOTE — Progress Notes (Signed)
Bangs, MD,  [address]  [place], [place]    [place] Phone:  [phone] FAX:  [phone]   Re:   [name] [name] DOB:   [birth year] MRN:   [id number]  URGENT OFFICE  ASSESSMENT AND PLAN: 1.  Right upper inner thigh abscess, approx 2 cm  I&D in office today.  The changing of this wound can be included with her left thigh wound dressing.  I don't think that there is a reason for antibiotics at this time.  She is to see Dr. Ninfa Linden back at the end of this month.  Her son [name] is here with her today.  2.  Major left thigh/perineum abscess drained at multiple I&D from 02/04/2013 - 02/16/2013 by Drs. Georjean Mode, and [place].  Still getting left thigh dressing changes twice a week with [facility].  Her sister, Frederich Balding, is learning to change the dressing.  3.  GERD 4.  Gout 5.  Diabetes mellitus 6.  Hypertension 7.  Morbid obesity  HISTORY OF PRESENT ILLNESS: Chief Complaint  Patient presents with  . New Evaluation    abcess on rt buttock    [name] [name] is a 62 y.o. (DOB: March 11, 1951)  AA  female who is a patient of AVBUERE,EDWIN A, MD and comes to Urgent Office today for new right upper inner thigh abscess.  The patient is getting home health care dressing changes to her left upper inner thigh and perineum for a large wound. There was noticed a new abscess involving her right upper inner thigh. She came to the office for evaluation of this abscess.  Past Medical History  Diagnosis Date  . History of hip replacement, total   . Hx MRSA infection     abscess left groin  . Arthritis   . GERD (gastroesophageal reflux disease)   . Gout   . Diabetes mellitus   . Hypertension   . Thyroid disease     Current Outpatient Prescriptions  Medication Sig Dispense Refill  . albuterol (PROVENTIL HFA;VENTOLIN HFA) 108 (90 BASE) MCG/ACT inhaler Inhale 2 puffs into the lungs every 6 (six) hours as  needed for wheezing or shortness of breath.      . allopurinol (ZYLOPRIM) 100 MG tablet Take 100 mg by mouth daily.        Marland Kitchen amLODipine (NORVASC) 10 MG tablet Take 10 mg by mouth daily.        . B Complex-C (B-COMPLEX WITH VITAMIN C) tablet Take 1 tablet by mouth 2 (two) times daily.      . cilostazol (PLETAL) 100 MG tablet take 1 tablet by mouth twice a day  60 tablet  11  . clindamycin (CLINDAGEL) 1 % gel Apply topically 3 (three) times daily.  30 g  0  . colchicine 0.6 MG tablet Take 0.6 mg by mouth 2 (two) times daily.        . diclofenac sodium (VOLTAREN) 1 % GEL Apply 2 g topically 2 (two) times daily as needed (for pain).      Marland Kitchen esomeprazole (NEXIUM) 40 MG capsule Take 40 mg by mouth daily before breakfast.        . ferrous sulfate 325 (65 FE) MG tablet Take 1 tablet (325 mg total) by mouth 3 (three) times daily with meals.  30 tablet  0  . fluticasone (FLONASE) 50 MCG/ACT nasal spray Place 2 sprays into the nose as needed for allergies.       Marland Kitchen  hydrALAZINE (APRESOLINE) 25 MG tablet Take 1 tablet (25 mg total) by mouth every 8 (eight) hours.  90 tablet  0  . hydrochlorothiazide (,MICROZIDE/HYDRODIURIL,) 12.5 MG capsule Take 12.5 mg by mouth daily.        . Insulin Aspart Prot & Aspart (NOVOLOG MIX 70/30 FLEXPEN [place]) Inject 30-40 Units into the skin 2 (two) times daily with a meal. take 40 units in the morning and 30 units in the evening      . insulin aspart protamine-insulin aspart (NOVOLOG 70/30) (70-30) 100 UNIT/ML injection Inject into the skin 2 (two) times daily with a meal.        . levothyroxine (SYNTHROID, LEVOTHROID) 150 MCG tablet Take 150 mcg by mouth daily.        . meloxicam (MOBIC) 15 MG tablet Take 15 mg by mouth daily.        . Multiple Vitamin (MULTIVITAMIN WITH MINERALS) TABS Take 1 tablet by mouth daily.      Marland Kitchen oxyCODONE-acetaminophen (PERCOCET) 10-325 MG per tablet Take 1 tablet by mouth every 4 (four) hours as needed for pain.  30 tablet  0  . polyethylene glycol  (MIRALAX / GLYCOLAX) packet Take 17 g by mouth 2 (two) times daily.  14 each  0  . senna-docusate (SENOKOT-S) 8.6-50 MG per tablet Take 1 tablet by mouth 2 (two) times daily.  30 tablet  0  . solifenacin (VESICARE) 5 MG tablet Take 5 mg by mouth daily.        No current facility-administered medications for this visit.    PHYSICAL EXAM: BP 138/78  Pulse 98  Temp(Src) 98.2 F (36.8 C) (Temporal)  Resp 16  Ht 5\' 4"  (1.626 m)  Wt 294 lb 9.6 oz (133.63 kg)  BMI 50.54 kg/m2  Perineum/thigh:  Open wound on left side is clean with good granulation tissue.  Right upper inner thigh she has a 2 cm abscess  Procedure:  The area was prepped with ChloroPrep.  Infiltrated with 10 cc 1% xylocaine with epi.  An 2 cm incision was made into the abscess.  The wound was packed.  I did not culture the wound.  DATA REVIEWED: Epic notes.  Alphonsa Overall, MD, [place] Office:  [phone]

## 2013-05-04 ENCOUNTER — Encounter (INDEPENDENT_AMBULATORY_CARE_PROVIDER_SITE_OTHER): Payer: Self-pay | Admitting: Surgery

## 2013-05-04 ENCOUNTER — Ambulatory Visit (INDEPENDENT_AMBULATORY_CARE_PROVIDER_SITE_OTHER): Payer: PRIVATE HEALTH INSURANCE | Admitting: Surgery

## 2013-05-04 VITALS — BP 130/80 | HR 92 | Temp 97.8°F | Resp 16 | Ht 64.0 in | Wt 289.0 lb

## 2013-05-04 DIAGNOSIS — T8189XA Other complications of procedures, not elsewhere classified, initial encounter: Secondary | ICD-10-CM

## 2013-05-04 NOTE — Progress Notes (Signed)
Subjective:     Patient ID: [name] [name], female   DOB: [birth year], 62 y.o.   MRN: [id number]  HPI She is here for another wound check. She reports she is developing a small nodule on the left side. This is near an area that Dr. Lucia Gaskins had to incise and drain several weeks ago. She reports no problems with her large wound on the left leg  Review of Systems     Objective:   Physical Exam There is a small palpable nodule on the right perianal area laterally. The right leg wound is well healing with excellent granulation tissue. I treated this with silver nitrate.    Assessment:     Nonhealing surgical wound as well as new developing nodule     Plan:     This area is quite small. I will place her on antibiotics and see her back and hopefully this will resolve without need for open drainage

## 2013-05-15 ENCOUNTER — Telehealth (INDEPENDENT_AMBULATORY_CARE_PROVIDER_SITE_OTHER): Payer: Self-pay

## 2013-05-15 NOTE — Telephone Encounter (Signed)
Patient is scheduled for [facility] 05/18/13 2:45 with DR. Newman patient is aware

## 2013-05-15 NOTE — Telephone Encounter (Signed)
Patient states she has a boil on her rt inner thigh; She has p/o appt on 05/22/13 . She is asking to be seen for both areas and asking for sooner appointment please advise

## 2013-05-18 ENCOUNTER — Ambulatory Visit (INDEPENDENT_AMBULATORY_CARE_PROVIDER_SITE_OTHER): Payer: PRIVATE HEALTH INSURANCE | Admitting: Surgery

## 2013-05-18 ENCOUNTER — Encounter (INDEPENDENT_AMBULATORY_CARE_PROVIDER_SITE_OTHER): Payer: Self-pay | Admitting: Surgery

## 2013-05-18 VITALS — BP 170/84 | HR 100 | Temp 97.9°F | Resp 16 | Ht 64.0 in | Wt 297.8 lb

## 2013-05-18 DIAGNOSIS — L02419 Cutaneous abscess of limb, unspecified: Secondary | ICD-10-CM

## 2013-05-18 DIAGNOSIS — L03119 Cellulitis of unspecified part of limb: Secondary | ICD-10-CM

## 2013-05-18 NOTE — Progress Notes (Addendum)
[place], MD,  [address]  [place], [place]    [place] Phone:  [phone] FAX:  [phone]   Re:   [name] [name] DOB:   04-Apr-1951 MRN:   [id number]  ASSESSMENT AND PLAN: 1.  Right pubic area abscess (1.5 cm) - this about 4 cm above inguinal crease.   [Photo at the end of the note]  I&D in office today  To see Dr. Ninfa Linden on Thursday - 8/14.  2.  Right thigh abscess (2.0 cm - most of this had already drained) -  This about 2 cm below the inguinal crease.  I&D in office today  3.  Major left thigh/perineum abscess drained at multiple I&D from 02/04/2013 - 02/16/2013 by Drs. Georjean Mode, and [place].  Wound - clean 4.  GERD  5.  Gout  6.  Diabetes mellitus  7.  Hypertension  8.  Morbid obesity 9.  Chronic indwelling foley  HISTORY OF PRESENT ILLNESS: Chief Complaint  Patient presents with  . URGENT    rt thigh abscess/boil    [name] [name] is a 62 y.o. (DOB: [birth year])  AA  female who is a patient of AVBUERE,EDWIN A, MD and comes to the urgent office today for 2 draining areas on her right side.  One is in her lower pubic area and one is on her right upper thigh.  Current Outpatient Prescriptions  Medication Sig Dispense Refill  . albuterol (PROVENTIL HFA;VENTOLIN HFA) 108 (90 BASE) MCG/ACT inhaler Inhale 2 puffs into the lungs every 6 (six) hours as needed for wheezing or shortness of breath.      . allopurinol (ZYLOPRIM) 100 MG tablet Take 100 mg by mouth daily.        Marland Kitchen amLODipine (NORVASC) 10 MG tablet Take 10 mg by mouth daily.        . B Complex-C (B-COMPLEX WITH VITAMIN C) tablet Take 1 tablet by mouth 2 (two) times daily.      Marland Kitchen BENICAR 40 MG tablet       . cilostazol (PLETAL) 100 MG tablet take 1 tablet by mouth twice a day  60 tablet  11  . clindamycin (CLINDAGEL) 1 % gel Apply topically 3 (three) times daily.  30 g  0  . colchicine 0.6 MG tablet Take 0.6 mg by mouth 2 (two) times  daily.        . diclofenac sodium (VOLTAREN) 1 % GEL Apply 2 g topically 2 (two) times daily as needed (for pain).      Marland Kitchen esomeprazole (NEXIUM) 40 MG capsule Take 40 mg by mouth daily before breakfast.        . ferrous sulfate 325 (65 FE) MG tablet Take 1 tablet (325 mg total) by mouth 3 (three) times daily with meals.  30 tablet  0  . fluticasone (FLONASE) 50 MCG/ACT nasal spray Place 2 sprays into the nose as needed for allergies.       . furosemide (LASIX) 40 MG tablet       . glycopyrrolate (ROBINUL) 1 MG tablet       . hydrALAZINE (APRESOLINE) 25 MG tablet Take 1 tablet (25 mg total) by mouth every 8 (eight) hours.  90 tablet  0  . hydrochlorothiazide (,MICROZIDE/HYDRODIURIL,) 12.5 MG capsule Take 12.5 mg by mouth daily.        . Insulin Aspart Prot & Aspart (NOVOLOG MIX 70/30 FLEXPEN [place]) Inject 30-40 Units into the skin 2 (  two) times daily with a meal. take 40 units in the morning and 30 units in the evening      . insulin aspart protamine-insulin aspart (NOVOLOG 70/30) (70-30) 100 UNIT/ML injection Inject into the skin 2 (two) times daily with a meal.        . KLOR-CON M20 20 MEQ tablet       . levothyroxine (SYNTHROID, LEVOTHROID) 150 MCG tablet Take 150 mcg by mouth daily.        . meloxicam (MOBIC) 15 MG tablet Take 15 mg by mouth daily.        . Multiple Vitamin (MULTIVITAMIN WITH MINERALS) TABS Take 1 tablet by mouth daily.      Marland Kitchen oxyCODONE-acetaminophen (PERCOCET) 10-325 MG per tablet Take 1 tablet by mouth every 4 (four) hours as needed for pain.  30 tablet  0  . polyethylene glycol (MIRALAX / GLYCOLAX) packet Take 17 g by mouth 2 (two) times daily.  14 each  0  . senna-docusate (SENOKOT-S) 8.6-50 MG per tablet Take 1 tablet by mouth 2 (two) times daily.  30 tablet  0  . solifenacin (VESICARE) 5 MG tablet Take 5 mg by mouth daily.        No current facility-administered medications for this visit.   Social History: Her sister helps her changes the dressing.  But she is here with  her son.  He was not in the room.  PHYSICAL EXAM: BP 170/84  Pulse 100  Temp(Src) 97.9 F (36.6 C) (Temporal)  Resp 16  Ht 5\' 4"  (1.626 m)  Wt 297 lb 12.8 oz (135.081 kg)  BMI 51.09 kg/m2  General: Obese AA F who is alert. Abdomen: Soft. Obese.  Right pubic area abscess (1.5 cm) - this about 4 cm above inguinal crease.    Has foley in place. Extremities:  Right upper thigh (about 2 cm below inguinal crease), she has a small 3 mm area which has drained with a harder 2.5 cm area under this.  Left perineum, the wound is healing.  Procedure:  The area in the right pubic area and the right thigh were cleaned with Chloroprep.  The skin was infiltrated with a total of 1% xylocaine with epi.  I did an I&D of both the right pubic area (about 3 cc of pus) and the right thigh area (very little pus).  The areas with sterilely dressed with gauze.    DATA REVIEWED: Epic notes   Alphonsa Overall, MD,  [facility], [place] [address]  [place], [place]    [place] Phone:  [phone] FAX:  [phone]

## 2013-05-22 ENCOUNTER — Ambulatory Visit (INDEPENDENT_AMBULATORY_CARE_PROVIDER_SITE_OTHER): Payer: PRIVATE HEALTH INSURANCE | Admitting: Surgery

## 2013-05-22 ENCOUNTER — Encounter (INDEPENDENT_AMBULATORY_CARE_PROVIDER_SITE_OTHER): Payer: Self-pay | Admitting: Surgery

## 2013-05-22 VITALS — BP 140/90 | HR 84 | Temp 98.0°F | Resp 16 | Ht 64.0 in | Wt 296.2 lb

## 2013-05-22 DIAGNOSIS — Z09 Encounter for follow-up examination after completed treatment for conditions other than malignant neoplasm: Secondary | ICD-10-CM

## 2013-05-22 MED ORDER — DOXYCYCLINE HYCLATE 100 MG PO TABS: 100.0000 mg | ORAL_TABLET | Freq: Two times a day (BID) | ORAL | Status: DC

## 2013-05-22 NOTE — Progress Notes (Signed)
Subjective:     Patient ID: [name] [name], female   DOB: 01/09/1951, 62 y.o.   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 Dr. Lucia Gaskins had to drain two more abscesses last week.    Review of Systems     Objective:   Physical Exam On exam, but the sites are healing well.She has a slightly enlarged lymph node in the groin. There was an area in the perineum which I inserted a needle into but found no purulence. Her other large abscess site is healing well    Assessment:     Patient stable postop     Plan:     She will continue her current wound care. I will place her back on doxycycline for 3 weeks. I will see her back in 3 weeks

## 2013-05-25 ENCOUNTER — Telehealth (INDEPENDENT_AMBULATORY_CARE_PROVIDER_SITE_OTHER): Payer: Self-pay

## 2013-05-25 DIAGNOSIS — G8918 Other acute postprocedural pain: Secondary | ICD-10-CM

## 2013-05-25 DIAGNOSIS — T3695XA Adverse effect of unspecified systemic antibiotic, initial encounter: Secondary | ICD-10-CM

## 2013-05-25 DIAGNOSIS — B379 Candidiasis, unspecified: Secondary | ICD-10-CM

## 2013-05-25 MED ORDER — FLUCONAZOLE 150 MG PO TABS: ORAL_TABLET | ORAL | Status: DC

## 2013-05-25 MED ORDER — HYDROCODONE-ACETAMINOPHEN 5-325 MG PO TABS: ORAL_TABLET | ORAL | Status: DC

## 2013-05-25 NOTE — Telephone Encounter (Signed)
Patient calling in to request a refill on her Oxycodone pain medication.  Patient also reports that she has developed a yeast infection from taking antibiotics.  Patient would like to have her [place] pick up her pain medication rx for Oxycodone and for yeast infection.  Per Dr. Ninfa Linden call in pain medication protocol for Norco 5/325mg , 1 po, q4-6 hrs prn pain, #30, 0 refills.  Also, Diflucan 150mg , patient to take 1 tablet po, w/3 refills. Drug interaction allergy was flagged at the time of entering the order in EPIC.  Called [facility] and spoke to pharmacist to discuss the drug interaction between Colchicine and Diflucan.  Pharmacy will place drug interaction alert on the medication prior to given to patient to ALERT to hold Colchicine while taking the Diflucan.  Prescription called in to [facility] on [address] in [place], [place].  Patient aware and will pick up medication.  Patient verbalized understanding.

## 2013-05-26 ENCOUNTER — Other Ambulatory Visit: Payer: Self-pay

## 2013-05-26 DIAGNOSIS — Z1231 Encounter for screening mammogram for malignant neoplasm of breast: Secondary | ICD-10-CM

## 2013-06-09 ENCOUNTER — Encounter (INDEPENDENT_AMBULATORY_CARE_PROVIDER_SITE_OTHER): Payer: Self-pay | Admitting: Surgery

## 2013-06-09 ENCOUNTER — Ambulatory Visit (INDEPENDENT_AMBULATORY_CARE_PROVIDER_SITE_OTHER): Payer: PRIVATE HEALTH INSURANCE | Admitting: Surgery

## 2013-06-09 VITALS — BP 150/90 | HR 116 | Resp 16 | Ht 64.0 in | Wt 291.2 lb

## 2013-06-09 DIAGNOSIS — L02419 Cutaneous abscess of limb, unspecified: Secondary | ICD-10-CM

## 2013-06-09 MED ORDER — SULFAMETHOXAZOLE-TRIMETHOPRIM 800-160 MG PO TABS: 2.0000 | ORAL_TABLET | Freq: Two times a day (BID) | ORAL | Status: DC

## 2013-06-09 NOTE — Progress Notes (Signed)
Subjective:     Patient ID: [name] [name], female   DOB: 11/15/1950, 62 y.o.   MRN: [id number]  HPI She is here for another visit. She has had several areas that have opened up and drained. She reports her perineal wound is doing well.   she still has a Foley catheter in place  Review of Systems     Objective:   Physical Exam On exam, her wounds are all clean and healing well    Assessment:     Patient with chronic perianal wounds     Plan:     I am going to switch her to Bactrim and get her Foley catheter out. I will see her back in 3 weeks

## 2013-06-16 ENCOUNTER — Ambulatory Visit: Payer: PRIVATE HEALTH INSURANCE

## 2013-06-22 ENCOUNTER — Ambulatory Visit: Payer: PRIVATE HEALTH INSURANCE

## 2013-06-30 ENCOUNTER — Ambulatory Visit (INDEPENDENT_AMBULATORY_CARE_PROVIDER_SITE_OTHER): Payer: PRIVATE HEALTH INSURANCE | Admitting: Surgery

## 2013-06-30 ENCOUNTER — Encounter (INDEPENDENT_AMBULATORY_CARE_PROVIDER_SITE_OTHER): Payer: Self-pay | Admitting: Surgery

## 2013-06-30 VITALS — BP 150/70 | HR 104 | Temp 97.4°F | Resp 18 | Ht 64.5 in | Wt 289.2 lb

## 2013-06-30 DIAGNOSIS — L02419 Cutaneous abscess of limb, unspecified: Secondary | ICD-10-CM

## 2013-06-30 NOTE — Progress Notes (Signed)
Subjective:     Patient ID: [name] [name], female   DOB: Mar 21, 1951, 62 y.o.   MRN: [id number]  HPI She is here for a followup visit. She is doing well and has no complaints. Her voiding is improving with the catheter out. She does have some mild incontinence  Review of Systems     Objective:   Physical Exam Her wounds are now healed and there is no recurrent infections    Assessment:     Patient improved     Plan:     She will finish her course of antibiotics. I will see her back as needed. If she still has urinary issues, we could consider referral to a urologist

## 2013-07-09 ENCOUNTER — Ambulatory Visit (INDEPENDENT_AMBULATORY_CARE_PROVIDER_SITE_OTHER): Payer: PRIVATE HEALTH INSURANCE | Admitting: Internal Medicine

## 2013-07-09 ENCOUNTER — Encounter: Payer: Self-pay | Admitting: Internal Medicine

## 2013-07-09 VITALS — BP 162/64 | HR 96 | Temp 98.4°F | Wt 286.0 lb

## 2013-07-09 DIAGNOSIS — L089 Local infection of the skin and subcutaneous tissue, unspecified: Secondary | ICD-10-CM

## 2013-07-09 NOTE — Progress Notes (Signed)
[facility] NOTE  RFV: community referral  Subjective:    Patient ID: [name] [name], female    DOB: 01-31-1951, 62 y.o.   MRN: [id number]  HPI 62 yo F with DM who recurrent boils, and skin abscess requiring frequent I x D and various rounds of antibiotics. Mostly occurring in buttocks area. She was I x D'd in June requiring packing, cx + pan sensitive ecoli. Since then has had still smaller lesions lanced and courses of doxycycline and bactrim. She has remote hx of MRSA deep tissue infection from hernia repair as well as nec fasciitis of left thigh in 2010 threatening the loss of left leg. Dr. Ninfa Linden is now managing her wound. Currently she does not have any lesions, and all healed over the last 10 days. Finished bactrim a few days ago.  Allergies  Allergen Reactions  . Ace Inhibitors     Tongue swell  . Morphine And Related Itching    Current Outpatient Prescriptions on File Prior to Visit  Medication Sig Dispense Refill  . albuterol (PROVENTIL HFA;VENTOLIN HFA) 108 (90 BASE) MCG/ACT inhaler Inhale 2 puffs into the lungs every 6 (six) hours as needed for wheezing or shortness of breath.      . allopurinol (ZYLOPRIM) 100 MG tablet Take 100 mg by mouth daily.        Marland Kitchen amLODipine (NORVASC) 10 MG tablet Take 10 mg by mouth daily.        . B Complex-C (B-COMPLEX WITH VITAMIN C) tablet Take 1 tablet by mouth 2 (two) times daily.      Marland Kitchen BENICAR 40 MG tablet       . cilostazol (PLETAL) 100 MG tablet take 1 tablet by mouth twice a day  60 tablet  11  . colchicine 0.6 MG tablet Take 0.6 mg by mouth 2 (two) times daily.        . diclofenac sodium (VOLTAREN) 1 % GEL Apply 2 g topically 2 (two) times daily as needed (for pain).      Marland Kitchen esomeprazole (NEXIUM) 40 MG capsule Take 40 mg by mouth daily before breakfast.        . ferrous sulfate 325 (65 FE) MG tablet Take 1 tablet (325 mg total) by mouth 3 (three) times daily with meals.  30 tablet  0  . fluconazole (DIFLUCAN) 150 MG tablet Take 1 tablet  by mouth.  DO NOT TAKE COLCHICINE while taking Diflucan.  1 tablet  3  . fluticasone (FLONASE) 50 MCG/ACT nasal spray Place 2 sprays into the nose as needed for allergies.       . furosemide (LASIX) 40 MG tablet       . glycopyrrolate (ROBINUL) 1 MG tablet       . hydrALAZINE (APRESOLINE) 25 MG tablet Take 1 tablet (25 mg total) by mouth every 8 (eight) hours.  90 tablet  0  . hydrochlorothiazide (,MICROZIDE/HYDRODIURIL,) 12.5 MG capsule Take 12.5 mg by mouth daily.        Marland Kitchen HYDROcodone-acetaminophen (NORCO) 5-325 MG per tablet Take 1 tablet by mouth every 4-6 hours as needed for pain  30 tablet  0  . Insulin Aspart Prot & Aspart (NOVOLOG MIX 70/30 FLEXPEN [place]) Inject 30-40 Units into the skin 2 (two) times daily with a meal. take 40 units in the morning and 30 units in the evening      . insulin aspart protamine-insulin aspart (NOVOLOG 70/30) (70-30) 100 UNIT/ML injection Inject into the skin 2 (two) times daily with a  meal.        . KLOR-CON M20 20 MEQ tablet       . levothyroxine (SYNTHROID, LEVOTHROID) 150 MCG tablet Take 150 mcg by mouth daily.        . meloxicam (MOBIC) 15 MG tablet Take 15 mg by mouth daily.        . Multiple Vitamin (MULTIVITAMIN WITH MINERALS) TABS Take 1 tablet by mouth daily.      Marland Kitchen oxyCODONE-acetaminophen (PERCOCET) 10-325 MG per tablet Take 1 tablet by mouth every 4 (four) hours as needed for pain.  30 tablet  0  . polyethylene glycol (MIRALAX / GLYCOLAX) packet Take 17 g by mouth 2 (two) times daily.  14 each  0  . senna-docusate (SENOKOT-S) 8.6-50 MG per tablet Take 1 tablet by mouth 2 (two) times daily.  30 tablet  0  . solifenacin (VESICARE) 5 MG tablet Take 5 mg by mouth daily.        No current facility-administered medications on file prior to visit.    History   Social History  . Marital Status: Married    Spouse Name: N/A    Number of Children: N/A  . Years of Education: N/A   Occupational History  . Not on file.   Social History Main Topics  .  Smoking status: Current Every Day Smoker -- 0.25 packs/day for 40 years    Types: Cigarettes  . Smokeless tobacco: Not on file  . Alcohol Use: No  . Drug Use: No  . Sexual Activity: Not on file   Other Topics Concern  . Not on file   Social History Narrative  . No narrative on file   family history includes Diabetes in her brother.  Review of Systems Review of Systems  Constitutional: Negative for fever, chills, diaphoresis, activity change, appetite change, fatigue and unexpected weight change.  HENT: Negative for congestion, sore throat, rhinorrhea, sneezing, trouble swallowing and sinus pressure.  Eyes: Negative for photophobia and visual disturbance.  Respiratory: Negative for cough, chest tightness, shortness of breath, wheezing and stridor.  Cardiovascular: Negative for chest pain, palpitations and leg swelling.  Gastrointestinal: Negative for nausea, vomiting, abdominal pain, diarrhea, constipation, blood in stool, abdominal distention and anal bleeding.  Genitourinary: urinary incontinence with depends. Musculoskeletal: Negative for myalgias, back pain, joint swelling, arthralgias and gait problem.  Skin: Negative for color change, pallor, rash and wound.  Neurological: Negative for dizziness, tremors, weakness and light-headedness.  Hematological: Negative for adenopathy. Does not bruise/bleed easily.  Psychiatric/Behavioral: Negative for behavioral problems, confusion, sleep disturbance, dysphoric mood, decreased concentration and agitation.       Objective:   Physical Exam BP 162/64  Pulse 96  Temp(Src) 98.4 F (36.9 C) (Oral)  Wt 286 lb (129.729 kg)  BMI 48.35 kg/m2 Physical Exam  Constitutional: oriented to person, place, and time.  appears well-developed and well-nourished. No distress.  HENT:  Mouth/Throat: Oropharynx is clear and moist. No oropharyngeal exudate.  Cardiovascular: Normal rate, regular rhythm and normal heart sounds. Exam reveals no gallop and  no friction rub.  No murmur heard.  Pulmonary/Chest: Effort normal and breath sounds normal. No respiratory distress.  no wheezes.  Abdominal: Soft. Bowel sounds are normal.  exhibits no distension. There is no tenderness.  Lymphadenopathy: no cervical adenopathy.  Neurological:  alert and oriented to person, place, and time.  Skin: Skin is warm and dry. No rash noted. No erythema.  Psychiatric:  a normal mood and affect.  behavior is normal.  Assessment & Plan:   furuncles = now resolved. Screen for mrsa to see if decolonization. With chg bath, and mupirocin cream

## 2013-07-12 LAB — MRSA CULTURE

## 2013-07-13 ENCOUNTER — Ambulatory Visit
Admission: RE | Admit: 2013-07-13 | Discharge: 2013-07-13 | Disposition: A | Discharge status: Home / Self Care | Payer: PRIVATE HEALTH INSURANCE | Source: Ambulatory Visit

## 2013-07-13 DIAGNOSIS — Z1231 Encounter for screening mammogram for malignant neoplasm of breast: Secondary | ICD-10-CM

## 2013-07-23 ENCOUNTER — Ambulatory Visit (INDEPENDENT_AMBULATORY_CARE_PROVIDER_SITE_OTHER): Payer: PRIVATE HEALTH INSURANCE | Admitting: Internal Medicine

## 2013-07-23 ENCOUNTER — Encounter: Payer: Self-pay | Admitting: Internal Medicine

## 2013-07-23 VITALS — BP 169/92 | HR 62 | Temp 97.7°F | Wt 287.0 lb

## 2013-07-23 DIAGNOSIS — L0292 Furuncle, unspecified: Secondary | ICD-10-CM

## 2013-07-23 DIAGNOSIS — L0293 Carbuncle, unspecified: Secondary | ICD-10-CM

## 2013-07-23 MED ORDER — OXYCODONE-ACETAMINOPHEN 10-325 MG PO TABS: 1.0000 | ORAL_TABLET | Freq: Four times a day (QID) | ORAL | Status: DC | PRN

## 2013-07-23 MED ORDER — DOXYCYCLINE HYCLATE 100 MG PO TABS: 100.0000 mg | ORAL_TABLET | Freq: Two times a day (BID) | ORAL | Status: DC

## 2013-07-23 NOTE — Progress Notes (Signed)
[facility] NOTE  RFV: recurrent skin abscess Subjective:    Patient ID: [name] [name], female    DOB: [birth year], 62 y.o.   MRN: [id number]  HPI 62 yo F with DM who recurrent boils, and skin abscess requiring frequent I x D and various rounds of antibiotics. Mostly occurring in buttocks area. She was I x D'd in June requiring packing, cx + pan sensitive ecoli. Since then has had still smaller lesions lanced and courses of doxycycline and bactrim. She has remote hx of MRSA deep tissue infection from hernia repair as well as nec fasciitis of left thigh in 2010 threatening the loss of left leg. Dr. Ninfa Linden is now managing her wound.   Currently she has had 3 lesions occur noticeably in right groin, mons pubis, and right axilla  Current Outpatient Prescriptions on File Prior to Visit  Medication Sig Dispense Refill  . albuterol (PROVENTIL HFA;VENTOLIN HFA) 108 (90 BASE) MCG/ACT inhaler Inhale 2 puffs into the lungs every 6 (six) hours as needed for wheezing or shortness of breath.      . allopurinol (ZYLOPRIM) 100 MG tablet Take 100 mg by mouth daily.        . B Complex-C (B-COMPLEX WITH VITAMIN C) tablet Take 1 tablet by mouth 2 (two) times daily.      Marland Kitchen BENICAR 40 MG tablet       . cilostazol (PLETAL) 100 MG tablet take 1 tablet by mouth twice a day  60 tablet  11  . diclofenac sodium (VOLTAREN) 1 % GEL Apply 2 g topically 2 (two) times daily as needed (for pain).      Marland Kitchen esomeprazole (NEXIUM) 40 MG capsule Take 40 mg by mouth daily before breakfast.        . ferrous sulfate 325 (65 FE) MG tablet Take 1 tablet (325 mg total) by mouth 3 (three) times daily with meals.  30 tablet  0  . fluticasone (FLONASE) 50 MCG/ACT nasal spray Place 2 sprays into the nose as needed for allergies.       . furosemide (LASIX) 40 MG tablet       . glycopyrrolate (ROBINUL) 1 MG tablet       . hydrochlorothiazide (,MICROZIDE/HYDRODIURIL,) 12.5 MG capsule Take 12.5 mg by mouth daily.        . Insulin Aspart  Prot & Aspart (NOVOLOG MIX 70/30 FLEXPEN [place]) Inject 30-40 Units into the skin 2 (two) times daily with a meal. take 40 units in the morning and 30 units in the evening      . insulin aspart protamine-insulin aspart (NOVOLOG 70/30) (70-30) 100 UNIT/ML injection Inject into the skin 2 (two) times daily with a meal.        . KLOR-CON M20 20 MEQ tablet       . levothyroxine (SYNTHROID, LEVOTHROID) 150 MCG tablet Take 150 mcg by mouth daily.        . meloxicam (MOBIC) 15 MG tablet Take 15 mg by mouth daily.        . Multiple Vitamin (MULTIVITAMIN WITH MINERALS) TABS Take 1 tablet by mouth daily.      Marland Kitchen oxyCODONE-acetaminophen (PERCOCET) 10-325 MG per tablet Take 1 tablet by mouth every 4 (four) hours as needed for pain.  30 tablet  0  . solifenacin (VESICARE) 5 MG tablet Take 5 mg by mouth daily.        No current facility-administered medications on file prior to visit.    Soc hx = had hectic  day today running between appt    Review of Systems 12 point ROS negative other than recurrent skin infection, feels like she is having concurrent yeast infection    Objective:   Physical Exam bp 169/92 and pulse 62 BP 177/112  Pulse 121  Temp(Src) 97.7 F (36.5 C) (Oral)  Wt 287 lb (130.182 kg)  BMI 48.52 kg/m2 gen = a x o by 3 gu = hard nodule in right inguinal canal area. Left nodule with purulent exudate on left side of mons pubis. Right axilla firm tender nodule no erythema, no fluctuance       Assessment & Plan:  Recurrent boils = will give another course of antibiotics. Recommend doxycycline 100mg  BID x 10 days. Use hot compress to facilitate drainage. Will give percocet for associated pain  Vaginal candidiasis = fluc 150mg  x 1

## 2013-08-04 ENCOUNTER — Encounter: Payer: Self-pay | Admitting: Podiatry

## 2013-08-04 ENCOUNTER — Ambulatory Visit (INDEPENDENT_AMBULATORY_CARE_PROVIDER_SITE_OTHER): Payer: Medicare Other | Admitting: Podiatry

## 2013-08-04 VITALS — BP 158/80 | HR 83 | Resp 20 | Ht 65.0 in | Wt 295.0 lb

## 2013-08-04 DIAGNOSIS — B353 Tinea pedis: Secondary | ICD-10-CM

## 2013-08-04 DIAGNOSIS — L02619 Cutaneous abscess of unspecified foot: Secondary | ICD-10-CM

## 2013-08-04 NOTE — Progress Notes (Signed)
N - itching L - between 4th and 5th toes right D - September 2014 O - gradual C - raw, moist, peeling skin, better A - none T - cream Rx'd while in hosptial? Jessicia presents today with a chief complaint of painful itching and the split between the fourth and fifth toes of the right foot. She states this been going on for the past week or so. Has gotten some better with some of the medication that she's been using for rash under her arms. She states that the diabetic shoes that she was dispensed several months back are painful and she's unable to wear them. She did not bring them with her today.  Objective: Pulses are palpable bilateral lower extremity no calf pain. She has a very mild superficial laceration between the fourth and fifth digits of the right foot. This appears to be healing. There is mild erythema but appears to be resolving. There is some area of maceration but again it appears to be resolving as well.  Assessment: Probable cellulitis with tinea pedis.  Plan: I provided her with samples of Naftin gel today. She will start applying this on a twice a day basis. I expressed to her that she needs a dry between her toes thoroughly. She will also start soaking in Epsom salts and water with a small amount of white vinegar. She will bring her diabetic shoes with her in one month when she follows up for this tinea infection.

## 2013-09-01 ENCOUNTER — Other Ambulatory Visit: Payer: Self-pay | Admitting: *Deleted

## 2013-09-01 DIAGNOSIS — I739 Peripheral vascular disease, unspecified: Secondary | ICD-10-CM

## 2013-09-01 MED ORDER — CILOSTAZOL 100 MG PO TABS: 100.0000 mg | ORAL_TABLET | Freq: Two times a day (BID) | ORAL | Status: DC

## 2013-09-08 ENCOUNTER — Encounter: Payer: Self-pay | Admitting: Podiatry

## 2013-09-08 ENCOUNTER — Ambulatory Visit (INDEPENDENT_AMBULATORY_CARE_PROVIDER_SITE_OTHER): Payer: Medicare Other | Admitting: Podiatry

## 2013-09-08 VITALS — BP 128/87 | HR 79 | Resp 16

## 2013-09-08 DIAGNOSIS — M79609 Pain in unspecified limb: Secondary | ICD-10-CM

## 2013-09-08 NOTE — Progress Notes (Signed)
[name] [name] presents today for followup of her painful right foot. She states it is no longer itching and pain between the fourth and fifth toes of the right foot.  Objective: Vital signs are stable she is alert and oriented x3. She has mild tinea pedis which is slowly resolving to the fourth interdigital space of the right foot.  Assessment: Well-healing tinea pedis.  Plan: Continue the use of the Naftin cream and followup with me as needed.

## 2013-10-12 ENCOUNTER — Encounter (INDEPENDENT_AMBULATORY_CARE_PROVIDER_SITE_OTHER): Payer: Self-pay | Admitting: General Surgery

## 2013-10-12 ENCOUNTER — Ambulatory Visit (INDEPENDENT_AMBULATORY_CARE_PROVIDER_SITE_OTHER): Payer: PRIVATE HEALTH INSURANCE | Admitting: General Surgery

## 2013-10-12 VITALS — BP 148/82 | HR 108 | Temp 98.3°F | Resp 15 | Ht 65.0 in | Wt 297.0 lb

## 2013-10-12 DIAGNOSIS — L0231 Cutaneous abscess of buttock: Secondary | ICD-10-CM

## 2013-10-12 DIAGNOSIS — L03317 Cellulitis of buttock: Secondary | ICD-10-CM

## 2013-10-12 MED ORDER — DOXYCYCLINE HYCLATE 100 MG PO CAPS: 100.0000 mg | ORAL_CAPSULE | Freq: Two times a day (BID) | ORAL | Status: DC

## 2013-10-12 NOTE — Progress Notes (Signed)
Subjective:     Patient ID: [name] [name], female   DOB: 02/28/1951, 63 y.o.   MRN: [id number]  HPI Patient's history of diabetes and is status post large incision and drainage of left peritonsillar abscess last year by Dr. Ninfa Linden. She developed some pain and a little bit of drainage from the superior portion of her previous scar. She's not had fevers. She comes to urgent clinic to be evaluated as a referral by Dr. Remigio Eisenmenger to see her in consultation.  Review of Systems     Objective:   Physical Exam  Constitutional: She appears well-developed. No distress.  Cardiovascular: Normal rate and normal heart sounds.   Pulmonary/Chest: Effort normal. No respiratory distress. She has no wheezes.  Abdominal: Soft. She exhibits no distension. There is no tenderness.  Genitourinary:  Left buttock at superior scar there is induration without redness or drainage, remainder of scar and perianal region appears without infection  Procedure area of induration was prepped in a sterile fashion. Local was injected. Incision was made and there was a small amount of bloody drainage. No significant purulent drainage. Iodoform was packed and a dressing was placed       Assessment:     Left buttock abscess    Plan:     This was incised and drained as above. Wound care instructions were given. Doxycycline 100 mg twice a day. Return in 3 days for recheck.

## 2013-10-15 ENCOUNTER — Encounter (INDEPENDENT_AMBULATORY_CARE_PROVIDER_SITE_OTHER): Payer: Self-pay

## 2013-10-15 ENCOUNTER — Ambulatory Visit (INDEPENDENT_AMBULATORY_CARE_PROVIDER_SITE_OTHER): Payer: PRIVATE HEALTH INSURANCE | Admitting: General Surgery

## 2013-10-15 ENCOUNTER — Encounter (INDEPENDENT_AMBULATORY_CARE_PROVIDER_SITE_OTHER): Payer: Self-pay | Admitting: General Surgery

## 2013-10-15 VITALS — BP 146/88 | HR 80 | Temp 97.0°F | Resp 18 | Ht 64.0 in | Wt 294.2 lb

## 2013-10-15 DIAGNOSIS — L03317 Cellulitis of buttock: Secondary | ICD-10-CM

## 2013-10-15 DIAGNOSIS — L0231 Cutaneous abscess of buttock: Secondary | ICD-10-CM

## 2013-10-15 NOTE — Progress Notes (Signed)
Subjective:     Patient ID: [name] [name], female   DOB: [birth year], 63 y.o.   MRN: [id number]  HPI  Patient is status post incision and drainage of left buttock abscess. She is doing well. Drainage has decreased. She is taking Doxycycline. Review of Systems     Objective:   Physical Exam Incision and drainage site is closing. There is no surrounding induration. Erythema is resolved. No evidence of significant ongoing infection.    Assessment:     Improving status post incision and drainage of left buttock abscess    Plan:     Complete course of doxycycline. Wound care instructions were given. Return when necessary.

## 2013-10-15 NOTE — Patient Instructions (Signed)
Completes your course of antibiotics. Please call if any symptoms return.

## 2013-11-03 ENCOUNTER — Other Ambulatory Visit (HOSPITAL_COMMUNITY): Payer: Self-pay | Admitting: Internal Medicine

## 2013-11-03 DIAGNOSIS — G459 Transient cerebral ischemic attack, unspecified: Secondary | ICD-10-CM

## 2013-11-05 ENCOUNTER — Ambulatory Visit (HOSPITAL_COMMUNITY)
Admission: RE | Admit: 2013-11-05 | Discharge: 2013-11-05 | Disposition: A | Discharge status: Home / Self Care | Payer: PRIVATE HEALTH INSURANCE | Source: Ambulatory Visit | Attending: Internal Medicine | Admitting: Internal Medicine

## 2013-11-05 DIAGNOSIS — G459 Transient cerebral ischemic attack, unspecified: Secondary | ICD-10-CM

## 2013-11-05 DIAGNOSIS — Z6841 Body Mass Index (BMI) 40.0 and over, adult: Secondary | ICD-10-CM | POA: Insufficient documentation

## 2013-11-05 DIAGNOSIS — E119 Type 2 diabetes mellitus without complications: Secondary | ICD-10-CM

## 2013-11-05 DIAGNOSIS — F17200 Nicotine dependence, unspecified, uncomplicated: Secondary | ICD-10-CM | POA: Insufficient documentation

## 2013-11-05 DIAGNOSIS — Z8673 Personal history of transient ischemic attack (TIA), and cerebral infarction without residual deficits: Secondary | ICD-10-CM | POA: Insufficient documentation

## 2013-11-05 DIAGNOSIS — I10 Essential (primary) hypertension: Secondary | ICD-10-CM

## 2013-11-05 DIAGNOSIS — I079 Rheumatic tricuspid valve disease, unspecified: Secondary | ICD-10-CM | POA: Insufficient documentation

## 2013-11-05 NOTE — Progress Notes (Signed)
Echocardiogram 2D Echocardiogram has been performed.  [name] [name] 11/05/2013, 12:25 PM

## 2013-12-06 DIAGNOSIS — R9439 Abnormal result of other cardiovascular function study: Secondary | ICD-10-CM

## 2013-12-06 HISTORY — DX: Abnormal result of other cardiovascular function study: R94.39

## 2013-12-23 ENCOUNTER — Encounter: Payer: Self-pay | Admitting: Neurology

## 2013-12-23 ENCOUNTER — Telehealth: Payer: Self-pay | Admitting: Neurology

## 2013-12-23 ENCOUNTER — Encounter (INDEPENDENT_AMBULATORY_CARE_PROVIDER_SITE_OTHER): Payer: Self-pay

## 2013-12-23 ENCOUNTER — Ambulatory Visit (INDEPENDENT_AMBULATORY_CARE_PROVIDER_SITE_OTHER): Payer: PRIVATE HEALTH INSURANCE | Admitting: Neurology

## 2013-12-23 VITALS — BP 173/100 | HR 91 | Ht 65.0 in | Wt 296.0 lb

## 2013-12-23 DIAGNOSIS — N179 Acute kidney failure, unspecified: Secondary | ICD-10-CM

## 2013-12-23 DIAGNOSIS — I10 Essential (primary) hypertension: Secondary | ICD-10-CM

## 2013-12-23 DIAGNOSIS — R29898 Other symptoms and signs involving the musculoskeletal system: Secondary | ICD-10-CM

## 2013-12-23 DIAGNOSIS — Z720 Tobacco use: Secondary | ICD-10-CM

## 2013-12-23 DIAGNOSIS — E119 Type 2 diabetes mellitus without complications: Secondary | ICD-10-CM

## 2013-12-23 DIAGNOSIS — F17200 Nicotine dependence, unspecified, uncomplicated: Secondary | ICD-10-CM

## 2013-12-23 DIAGNOSIS — G459 Transient cerebral ischemic attack, unspecified: Secondary | ICD-10-CM | POA: Insufficient documentation

## 2013-12-23 MED ORDER — CLOPIDOGREL BISULFATE 75 MG PO TABS: 75.0000 mg | ORAL_TABLET | Freq: Every day | ORAL | Status: DC

## 2013-12-23 NOTE — Telephone Encounter (Addendum)
I received call from [facility] radiologist, I failed to reach her by cell, home phones  Right frontal small acute stroke.  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  Sandy, please call her for above result, also advise her to go to ED for worsening focal symptoms, palpitation, chest pain.  Please make sure that she got her prolonged 28 days cardiac monitoring and ECHO soon, order is placed.

## 2013-12-23 NOTE — Progress Notes (Signed)
PATIENT: [name] [name] DOB: June 01, 1951  HISTORICAL  [name] [name] is a 63 years old AA female, referred by her primary care physician Dr. [name] for evaluation of left arm difficulty  She has PMHx of obesity, HTN, DM, insulin-dependent, presented with recurrent episode of left arm difficulty  About 3 months ago, in December 2014, while sitting down watching TV, she felt left facial droopy, she massaged it for few minutes, gradually subsided,  2 months ago, in January 2015, while folding her cloth, reaching down, she noticed left arm weakness, has difficulty lifting it up against gravity, the blood pressure was 60/50s,  when she checked on it, her symptoms lost about few minutes, gradually subsided  The second episode was a few weeks ago, she was standing on, felt fainting sensation, her left arm went limp, she has difficulty moving her left fourth and fifth fingers, lasting for 5 minutes, recovered after she lied down resting  Today, on her way here, around 9:30 AM, she felt her heart was acting out, irregular heart rate, feeling weird, lasting for a few minutes, she felt again left arm a little bit heavy, clumsy, she can still recent left arm against gravity without difficulty, she has mild gait difficulty due to her right hip pain,    She had a history of right hip replacement. She denies significant chest pain, no shortness of breath    REVIEW OF SYSTEMS: Full 14 system review of systems performed and notable only for palpitation, swelling in legs, blurry vision, shortness of breath, snoring, incontinence, joint pain, cramps, allergies, frequent infection, weakness, snoring, restless legs, too much sleep, decreased energy   ALLERGIES: Allergies  Allergen Reactions  . Ace Inhibitors     Tongue swell  . Morphine And Related Itching    HOME MEDICATIONS: Current Outpatient Prescriptions on File Prior to Visit  Medication Sig Dispense Refill  . albuterol (PROVENTIL  HFA;VENTOLIN HFA) 108 (90 BASE) MCG/ACT inhaler Inhale 2 puffs into the lungs every 6 (six) hours as needed for wheezing or shortness of breath.      . allopurinol (ZYLOPRIM) 100 MG tablet Take 100 mg by mouth daily.        . B Complex-C (B-COMPLEX WITH VITAMIN C) tablet Take 1 tablet by mouth 2 (two) times daily.      Marland Kitchen BENICAR 40 MG tablet       . cilostazol (PLETAL) 100 MG tablet       . esomeprazole (NEXIUM) 40 MG capsule Take 40 mg by mouth daily before breakfast.        . fluticasone (FLONASE) 50 MCG/ACT nasal spray Place 2 sprays into the nose as needed for allergies.       . furosemide (LASIX) 40 MG tablet       . hydrochlorothiazide (,MICROZIDE/HYDRODIURIL,) 12.5 MG capsule Take 12.5 mg by mouth daily.        . insulin aspart protamine-insulin aspart (NOVOLOG 70/30) (70-30) 100 UNIT/ML injection Inject into the skin 2 (two) times daily with a meal.        . KLOR-CON M20 20 MEQ tablet       . levothyroxine (SYNTHROID, LEVOTHROID) 150 MCG tablet Take 150 mcg by mouth daily.        . meloxicam (MOBIC) 15 MG tablet Take 15 mg by mouth daily.        . Multiple Vitamin (MULTIVITAMIN WITH MINERALS) TABS Take 1 tablet by mouth daily.      Marland Kitchen oxyCODONE-acetaminophen (PERCOCET) 10-325  MG per tablet Take 1 tablet by mouth every 6 (six) hours as needed for pain.  20 tablet  0  . VESICARE 10 MG tablet          PAST MEDICAL HISTORY: Past Medical History  Diagnosis Date  . History of hip replacement, total right 2010, revise in 2012.   Marland Kitchen Hx MRSA infection     abscess left groin  . Arthritis   . GERD (gastroesophageal reflux disease)   . Gout   . Diabetes mellitus since 2008   . Hypertension   . Thyroid disease     PAST SURGICAL HISTORY: Past Surgical History  Procedure Laterality Date  . Thyroidectomy    . Abdominal hysterectomy    . Ventral hernia repair    . Laparoscopic cholecystectomy    . Forearm fracture surgery      Left arm  . Joint replacement    . Hernia repair      w/  mesh  . Incision and drainage abscess Left 02/04/2013    Procedure: INCISION AND DRAINAGE LEFT BUTTOCK ABSCESS; INCISION AND DRAINAGE LEFT BREAST ABSCESS;  Surgeon: Harl Bowie, MD;  Location: [facility];  Service: General;  Laterality: Left;  . Irrigation and debridement abscess N/A 02/06/2013    Procedure: IRRIGATION AND DEBRIDEMENT BUTTOCK ABSCESS AND DRESSING CHANGE;  Surgeon: Harl Bowie, MD;  Location: [facility];  Service: General;  Laterality: N/A;  . Irrigation and debridement abscess Left 02/08/2013    Procedure: IRRIGATION AND DEBRIDEMENT ABSCESS/DRESSING CHANGE;  Surgeon: Gwenyth Ober, MD;  Location: [facility];  Service: General;  Laterality: Left;  . Incision and drainage perirectal abscess Left 02/10/2013    Procedure: IRRIGATION AND DEBRIDEMENT OF BUTTOCK/PERINEAL ABSCESS;  Surgeon: [name]. Georgette Dover, MD;  Location: [facility];  Service: General;  Laterality: Left;  . Incision and drainage abscess N/A 02/12/2013    Procedure: INCISION AND DEBRIDEMENT BUTTOCK WOUND ;  Surgeon: [name]. Georgette Dover, MD;  Location: [facility];  Service: General;  Laterality: N/A;  . Incision and drainage abscess N/A 02/14/2013    Procedure: INCISION AND DRAINAGE/DRESSING CHANGE;  Surgeon: Harl Bowie, MD;  Location: [facility];  Service: General;  Laterality: N/A;  . Incision and drainage perirectal abscess N/A 02/16/2013    Procedure: IRRIGATION AND DEBRIDEMENT PERINEAL ABSCESS;  Surgeon: Zenovia Jarred, MD;  Location: [facility];  Service: General;  Laterality: N/A;    FAMILY HISTORY: Family History  Problem Relation Age of Onset  . Diabetes Brother     SOCIAL HISTORY:  History   Social History  . Marital Status: Married    Spouse Name: N/A    Number of Children: 1  . Years of Education: college   Occupational History    Disabled   Social History Main Topics  . Smoking status: Current Every Day Smoker -- 0.25 packs/day for 40 years    Types: Cigarettes  . Smokeless tobacco: Never Used  . Alcohol Use: 0.5  oz/week    1 drink(s) per week     Comment: OCC  . Drug Use: No     Comment: Quit four years ago.  Marland Kitchen Sexual Activity: Not on file   Other Topics Concern  . Not on file   Social History Narrative   Patient is separated  and lives at home with her friend. Patient is disabled.   Education one year of college   Right handed   Caffeine two cups daily.  PHYSICAL EXAM   Filed Vitals:   12/23/13 1033  BP: 173/100  Pulse: 91  Height: 5\' 5"  (1.651 m)  Weight: 296 lb (134.265 kg)    Not recorded    Body mass index is 49.26 kg/(m^2).   Generalized: In no acute distress  Neck: Supple, no carotid bruits   Cardiac: Regular rate rhythm  Pulmonary: Clear to auscultation bilaterally  Musculoskeletal: No deformity  Neurological examination  Mentation: Alert oriented to time, place, history taking, and causual conversation  Cranial nerve II-XII: Pupils were equal round reactive to light. Extraocular movements were full.  Visual field were full on confrontational test. Bilateral fundi were sharp.  Facial sensation and strength were normal. Hearing was intact to finger rubbing bilaterally. Uvula tongue midline.  Head turning and shoulder shrug and were normal and symmetric.Tongue protrusion into cheek strength was normal.  Motor: Normal tone, bulk and strength,  She has a surgical scar at left wrist, mild fixation of left arm upon rapid alternating movements,.  Sensory: Intact to fine touch, pinprick, preserved vibratory sensation, and proprioception at toes.  Coordination: Normal finger to nose, heel-to-shin bilaterally there was no truncal ataxia  Gait: Rising up from seated position without assistance, normal stance,  mild ache allergy, cautious gait,  Deep tendon reflexes: Hypoactive and symmetric plantar responses were flexor bilaterally.   DIAGNOSTIC DATA (LABS, IMAGING, TESTING) - I reviewed patient records, labs, notes, testing and imaging myself where  available.  Lab Results  Component Value Date   WBC 8.0 02/18/2013   HGB 8.6* 02/18/2013   HCT 27.8* 02/18/2013   MCV 86.6 02/18/2013   PLT 373 02/18/2013      Component Value Date/Time   NA 141 02/18/2013 0500   K 4.0 02/18/2013 0500   CL 102 02/18/2013 0500   CO2 34* 02/18/2013 0500   GLUCOSE 119* 02/18/2013 0500   BUN 9 02/18/2013 0500   CREATININE 0.95 02/18/2013 0500   CALCIUM 8.7 02/18/2013 0500   PROT 5.8* 02/03/2013 0515   ALBUMIN 1.6* 02/11/2013 0500   AST 36 02/03/2013 0515   ALT 37* 02/03/2013 0515   ALKPHOS 122* 02/03/2013 0515   BILITOT 1.0 02/03/2013 0515   GFRNONAA 63* 02/18/2013 0500   GFRAA 73* 02/18/2013 0500   Lab Results  Component Value Date   CHOL  Value: 151        ATP III CLASSIFICATION:  <200     mg/dL   Desirable  200-239  mg/dL   Borderline High  >=240    mg/dL   High        11/28/2008   HDL <10* 11/28/2008   LDLCALC NOT CALCULATED 11/28/2008   TRIG 245* 11/28/2008   CHOLHDL NOT CALCULATED 11/28/2008   Lab Results  Component Value Date   HGBA1C 7.6* 02/05/2013   No results found for this basename: [id number]   Lab Results  Component Value Date   TSH 2.141 06/21/2010      ASSESSMENT AND PLAN  [name] [name] is a 63 y.o. female  with vascular risk factor of obesity, hypertension, diabetes, hyperlipidemia, presenting with recurrent episode of left arm weakness, most suggestive of a TIA, involving right hemisphere, She also complains of heart beating weird on her way to office today could indicate arrhythmia,  1 I have suggested her going to the emergency room to expedite the workup, but she first to have workup as outpatient  2. My office has coordinated with [facility], she would be able to have MRI of  brain, MRA of brain and neck today at [facility]. 3.   I will also refer her to cardiac monitoring, echocardiogram  4.she is already taking aspirin 81 mg, add on Plavix   75 mg  5 laboratory evaluation today, including troponin  6. I have advised her to go to  the emergency room for recurrent chest pain, irregular heartbeat, left-sided weakness she voice understanding,  7 return to clinic in 2 weeks, I will call her report of above workup. Marcial Pacas, M.D. Ph.D.  [facility] [address], [place], [place] [address] [phone]

## 2013-12-24 ENCOUNTER — Other Ambulatory Visit: Payer: Self-pay | Admitting: *Deleted

## 2013-12-24 DIAGNOSIS — R002 Palpitations: Secondary | ICD-10-CM

## 2013-12-24 LAB — C-REACTIVE PROTEIN: CRP: 17.5 mg/L — ABNORMAL HIGH (ref 0.0–4.9)

## 2013-12-24 LAB — LIPID PANEL
Chol/HDL Ratio: 3.9 ratio units (ref 0.0–4.4)
Cholesterol, Total: 188 mg/dL (ref 100–199)
HDL: 48 mg/dL (ref >39)
LDL Calculated: 119 mg/dL — ABNORMAL HIGH (ref 0–99)
Triglycerides: 103 mg/dL (ref 0–149)
VLDL Cholesterol Cal: 21 mg/dL (ref 5–40)

## 2013-12-24 LAB — CBC WITH DIFFERENTIAL
Basophils Absolute: 0 10*3/uL (ref 0.0–0.2)
Basos: 0 %
Eos: 1 %
Eosinophils Absolute: 0.1 10*3/uL (ref 0.0–0.4)
HCT: 36.4 % (ref 34.0–46.6)
Hemoglobin: 12 g/dL (ref 11.1–15.9)
Immature Grans (Abs): 0 10*3/uL (ref 0.0–0.1)
Immature Granulocytes: 0 %
Lymphocytes Absolute: 2.5 10*3/uL (ref 0.7–3.1)
Lymphs: 19 %
MCH: 25.6 pg — ABNORMAL LOW (ref 26.6–33.0)
MCHC: 33 g/dL (ref 31.5–35.7)
MCV: 78 fL — ABNORMAL LOW (ref 79–97)
Monocytes Absolute: 0.5 10*3/uL (ref 0.1–0.9)
Monocytes: 4 %
Neutrophils Absolute: 10 10*3/uL — ABNORMAL HIGH (ref 1.4–7.0)
Neutrophils Relative %: 76 %
Platelets: 326 10*3/uL (ref 150–379)
RBC: 4.68 x10E6/uL (ref 3.77–5.28)
RDW: 15.7 % — ABNORMAL HIGH (ref 12.3–15.4)
WBC: 13.2 10*3/uL — ABNORMAL HIGH (ref 3.4–10.8)

## 2013-12-24 LAB — COMPREHENSIVE METABOLIC PANEL
ALT: 10 IU/L (ref 0–32)
AST: 13 IU/L (ref 0–40)
Albumin/Globulin Ratio: 1.3 (ref 1.1–2.5)
Albumin: 4.1 g/dL (ref 3.6–4.8)
Alkaline Phosphatase: 88 IU/L (ref 39–117)
BUN/Creatinine Ratio: 22 (ref 11–26)
BUN: 31 mg/dL — ABNORMAL HIGH (ref 8–27)
CO2: 22 mmol/L (ref 18–29)
Calcium: 9.9 mg/dL (ref 8.7–10.3)
Chloride: 104 mmol/L (ref 97–108)
Creatinine, Ser: 1.38 mg/dL — ABNORMAL HIGH (ref 0.57–1.00)
GFR calc Af Amer: 47 mL/min/{1.73_m2} — ABNORMAL LOW (ref >59)
GFR calc non Af Amer: 41 mL/min/{1.73_m2} — ABNORMAL LOW (ref >59)
Globulin, Total: 3.2 g/dL (ref 1.5–4.5)
Glucose: 86 mg/dL (ref 65–99)
Potassium: 4.5 mmol/L (ref 3.5–5.2)
Sodium: 146 mmol/L — ABNORMAL HIGH (ref 134–144)
Total Bilirubin: 0.2 mg/dL (ref 0.0–1.2)
Total Protein: 7.3 g/dL (ref 6.0–8.5)

## 2013-12-24 LAB — THYROID PANEL WITH TSH
Free Thyroxine Index: 2.9 (ref 1.2–4.9)
T3 Uptake Ratio: 29 % (ref 24–39)
T4, Total: 10 ug/dL (ref 4.5–12.0)
TSH: 0.262 u[IU]/mL — ABNORMAL LOW (ref 0.450–4.500)

## 2013-12-24 LAB — HGB A1C W/O EAG: Hgb A1c MFr Bld: 7.5 % — ABNORMAL HIGH (ref 4.8–5.6)

## 2013-12-24 LAB — CK TOTAL AND CKMB (NOT AT ARMC)
CK-MB Index: 2.5 ng/mL (ref 0.0–5.3)
Total CK: 81 U/L (ref 24–173)

## 2013-12-24 LAB — RPR: RPR: NONREACTIVE

## 2013-12-24 LAB — VITAMIN B12: Vitamin B-12: 1058 pg/mL — ABNORMAL HIGH (ref 211–946)

## 2013-12-24 LAB — TROPONIN T

## 2013-12-24 LAB — ANA W/REFLEX IF POSITIVE: Anti Nuclear Antibody(ANA): NEGATIVE

## 2013-12-24 LAB — FOLATE: Folate: >19.9 ng/mL (ref >3.0)

## 2013-12-24 NOTE — Telephone Encounter (Addendum)
I called pt and relayed the information below.  She verbalized understanding.   I told her about the cardiac event monitor and EKG.  2D echo cancelled since had one in January 2015 per referrals.   She will call back if questions.   She has friend living with her.  She was informed if worsening sx , palpitations or chest pain to go to ED.

## 2013-12-24 NOTE — Addendum Note (Signed)
Addended by: Marcial Pacas on: 12/24/2013 10:44 AM   Modules accepted: Orders

## 2013-12-24 NOTE — Addendum Note (Signed)
Addended byOliver Hum on: 12/24/2013 11:52 AM   Modules accepted: Orders

## 2013-12-25 ENCOUNTER — Telehealth: Payer: Self-pay | Admitting: Neurology

## 2013-12-25 NOTE — Telephone Encounter (Signed)
I have called patient, she has mild acute right frontal stroke, she has one more episode of heart rate irregular beat December 24 2013, but there was no recurrent weakness, she is taking aspirin, plus Plavix right now, cardiac monitoring is scheduled for March 20 fourth, laboratory showed elevated creatinine 1 point 3 8, which is higher than her baseline, elevated white count 13 point 2, TSH, she denies fever, no signs of infection,  1, I will fax the laboratory result to her primary care, she were seeing him again in March 31 2, continue aspirin, Plavix, keep  well hydration,

## 2013-12-29 ENCOUNTER — Encounter: Payer: Self-pay | Admitting: *Deleted

## 2013-12-29 ENCOUNTER — Ambulatory Visit (INDEPENDENT_AMBULATORY_CARE_PROVIDER_SITE_OTHER): Payer: PRIVATE HEALTH INSURANCE | Admitting: Cardiology

## 2013-12-29 ENCOUNTER — Encounter: Payer: Self-pay | Admitting: Cardiology

## 2013-12-29 ENCOUNTER — Other Ambulatory Visit: Payer: Self-pay | Admitting: *Deleted

## 2013-12-29 ENCOUNTER — Encounter (INDEPENDENT_AMBULATORY_CARE_PROVIDER_SITE_OTHER): Payer: PRIVATE HEALTH INSURANCE

## 2013-12-29 VITALS — BP 160/80 | HR 90 | Ht 65.0 in | Wt 298.0 lb

## 2013-12-29 DIAGNOSIS — I499 Cardiac arrhythmia, unspecified: Secondary | ICD-10-CM

## 2013-12-29 DIAGNOSIS — R42 Dizziness and giddiness: Secondary | ICD-10-CM

## 2013-12-29 DIAGNOSIS — R002 Palpitations: Secondary | ICD-10-CM

## 2013-12-29 DIAGNOSIS — R55 Syncope and collapse: Secondary | ICD-10-CM

## 2013-12-29 NOTE — Progress Notes (Signed)
Spoke with Dr Krista Blue and will change her event monitor to 48 hour monitor secondary to billing issues. Order in [place] under Dr Meda Coffee, doctor of the day.

## 2013-12-29 NOTE — Progress Notes (Signed)
Patient ID: [name] [name], female   DOB: Jun 29, 1951, 63 y.o.   MRN: [id number] E-Cardio 48 hour holter monitor applied to patient.  Order changed from event monitor to 48 hour per [facility] after she spoke with Dr. Krista Blue to clarify orders.

## 2013-12-30 NOTE — Progress Notes (Signed)
Patient ID: [name] [name], female   DOB: May 12, 1951, 63 y.o.   MRN: [id number]  The patient came for an ECG.

## 2014-01-19 ENCOUNTER — Telehealth: Payer: Self-pay | Admitting: Cardiovascular Disease

## 2014-01-25 ENCOUNTER — Ambulatory Visit (INDEPENDENT_AMBULATORY_CARE_PROVIDER_SITE_OTHER): Payer: PRIVATE HEALTH INSURANCE | Admitting: Neurology

## 2014-01-25 ENCOUNTER — Encounter: Payer: Self-pay | Admitting: Neurology

## 2014-01-25 ENCOUNTER — Encounter (INDEPENDENT_AMBULATORY_CARE_PROVIDER_SITE_OTHER): Payer: Self-pay

## 2014-01-25 VITALS — Ht 65.0 in | Wt 294.0 lb

## 2014-01-25 DIAGNOSIS — E119 Type 2 diabetes mellitus without complications: Secondary | ICD-10-CM

## 2014-01-25 DIAGNOSIS — F17200 Nicotine dependence, unspecified, uncomplicated: Secondary | ICD-10-CM

## 2014-01-25 DIAGNOSIS — D62 Acute posthemorrhagic anemia: Secondary | ICD-10-CM

## 2014-01-25 DIAGNOSIS — I10 Essential (primary) hypertension: Secondary | ICD-10-CM

## 2014-01-25 DIAGNOSIS — Z720 Tobacco use: Secondary | ICD-10-CM

## 2014-01-25 MED ORDER — ROSUVASTATIN CALCIUM 10 MG PO TABS: 10.0000 mg | ORAL_TABLET | Freq: Every day | ORAL | Status: DC

## 2014-01-25 NOTE — Progress Notes (Addendum)
PATIENT: [name] [name] DOB: [birth year]  HISTORICAL  [name] [name] is a 63 years old AA female, referred by her primary care physician Dr. [name] for evaluation of left arm difficulty, initial visit March 18th 2015.  She has PMHx of obesity, HTN, DM, insulin-dependent, presented with recurrent episode of left arm difficulty  About 3 months ago, in December 2014, while sitting down watching TV, she felt left facial droopy, she massaged it for few minutes, gradually subsided,  2 months ago, in January 2015, while folding her cloth, reaching down, she noticed left arm weakness, has difficulty lifting it up against gravity, the blood pressure was 60/50s,  when she checked on it, her symptoms lost about few minutes, gradually subsided  The second episode was a few weeks ago, she was standing on, felt fainting sensation, her left arm went limp, she has difficulty moving her left fourth and fifth fingers, lasting for 5 minutes, recovered after she lied down resting  Today, March 18th 2015, on her way here, around 9:30 AM, she felt her heart was acting out, irregular heart rate, feeling weird, lasting for a few minutes, she felt again left arm a little bit heavy, clumsy, she can still raise her left arm against gravity without difficulty, she has mild gait difficulty due to her right hip pain,    She had a history of right hip replacement. She denies significant chest pain, no shortness of breath  UPDATE April 20th 2015;  Ultrasound of carotid artery in January 2015: has demonstrate bilateral 1-39% internal carotid artery stenosis  Echocardiogram: Left ventricle: The cavity size was normal. There was moderate concentric hypertrophy. Systolic function was mildly reduced. The estimated ejection fraction was in the range of 45% to 50%. Possible hypokinesis of the lateral myocardium. Doppler parameters are consistent with abnormal left ventricular relaxation   She also had cardiac monitoring  March 25th to 27th, 2015, which has demonstrated sinus bradycardia to sinus tachycardia, with heart rate ranging from 49-140 eats per minute, first degree AV block, recurrence of the brought T-wave, there was supraventricular ectopy, 222 single ventricular ectopy, she was made appointments with his cardiologist Dr. Gena Fray in May 8th 2015.  She also reported 2 weeks history of heart beating loud tinnitus since early April 2015, she complained of dark black stool, asa was taken off by her PCP Dr. Rhys Martini, per patient, stool occult blood was negative,  now she has no black stool anymore, she is still taking Plavix 75 mg a day, before she came to see me in March 2015,  she was taking aspirin 81 mg daily, I have added Plavix to her treatment. 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  MRA of the neck was normal, patent bilateral carotid, and vertebral arteries.  Laboratory evaluation showed normal or negative F63, folic acid, RPR, ANA, CMP, with exception of mild elevated creatinine 1.3 8, mild elevated WBC 13.7, MCV 78, Decreased LDL 119, normal CK-MB,  REVIEW OF SYSTEMS: Full 14 system review of systems performed and notable only for blurred vision, shortness of  breath, leg swelling, palpitation, frequent awakening, muscle cramps, environmental allergies, mole, bruise easily   ALLERGIES: Allergies  Allergen Reactions  . Ace Inhibitors     Tongue swell  . Morphine And Related Itching    HOME  MEDICATIONS: Current Outpatient Prescriptions on File Prior to Visit  Medication Sig Dispense Refill  . albuterol (PROVENTIL HFA;VENTOLIN HFA) 108 (90 BASE) MCG/ACT inhaler Inhale 2 puffs into the lungs every 6 (six) hours as needed for wheezing or shortness of breath.      . allopurinol (ZYLOPRIM) 100 MG tablet Take 100 mg by mouth daily.        . B Complex-C (B-COMPLEX WITH VITAMIN C) tablet Take 1 tablet by mouth 2 (two) times daily.      Marland Kitchen BENICAR 40 MG tablet       . cilostazol (PLETAL) 100 MG tablet       . esomeprazole (NEXIUM) 40 MG capsule Take 40 mg by mouth daily before breakfast.        . fluticasone (FLONASE) 50 MCG/ACT nasal spray Place 2 sprays into the nose as needed for allergies.       . furosemide (LASIX) 40 MG tablet       . hydrochlorothiazide (,MICROZIDE/HYDRODIURIL,) 12.5 MG capsule Take 12.5 mg by mouth daily.        . insulin aspart protamine-insulin aspart (NOVOLOG 70/30) (70-30) 100 UNIT/ML injection Inject into the skin 2 (two) times daily with a meal.        . KLOR-CON M20 20 MEQ tablet       . levothyroxine (SYNTHROID, LEVOTHROID) 150 MCG tablet Take 150 mcg by mouth daily.        . meloxicam (MOBIC) 15 MG tablet Take 15 mg by mouth daily.        . Multiple Vitamin (MULTIVITAMIN WITH MINERALS) TABS Take 1 tablet by mouth daily.      Marland Kitchen oxyCODONE-acetaminophen (PERCOCET) 10-325 MG per tablet Take 1 tablet by mouth every 6 (six) hours as needed for pain.  20 tablet  0  . VESICARE 10 MG tablet          PAST MEDICAL HISTORY: Past Medical History  Diagnosis Date  . History of hip replacement, total right 2010, revise in 2012.   Marland Kitchen Hx MRSA infection     abscess left groin  . Arthritis   . GERD (gastroesophageal reflux disease)   . Gout   . Diabetes mellitus since 2008   . Hypertension   . Thyroid disease     PAST SURGICAL HISTORY: Past Surgical History  Procedure Laterality Date  . Thyroidectomy    . Abdominal hysterectomy    . Ventral hernia repair     . Laparoscopic cholecystectomy    . Forearm fracture surgery      Left arm  . Joint replacement    . Hernia repair      w/ mesh  . Incision and drainage abscess Left 02/04/2013    Procedure: INCISION AND DRAINAGE LEFT BUTTOCK ABSCESS; INCISION AND DRAINAGE LEFT BREAST ABSCESS;  Surgeon: Harl Bowie, MD;  Location: [facility];  Service: General;  Laterality: Left;  . Irrigation and debridement abscess N/A 02/06/2013    Procedure: IRRIGATION AND DEBRIDEMENT BUTTOCK ABSCESS AND DRESSING CHANGE;  Surgeon: Harl Bowie, MD;  Location: [facility];  Service: General;  Laterality: N/A;  . Irrigation and debridement abscess Left 02/08/2013    Procedure: IRRIGATION AND DEBRIDEMENT ABSCESS/DRESSING CHANGE;  Surgeon: Gwenyth Ober, MD;  [facility];  Service: General;  Laterality: Left;  . Incision and drainage perirectal abscess Left 02/10/2013    Procedure: IRRIGATION AND DEBRIDEMENT OF BUTTOCK/PERINEAL ABSCESS;  Surgeon: [name]. Georgette Dover, MD;  Location: [place];  Service: General;  Laterality: Left;  . Incision and drainage abscess N/A 02/12/2013    Procedure: INCISION AND DEBRIDEMENT BUTTOCK WOUND ;  Surgeon: [name]. Georgette Dover, MD;  Location: [place];  Service: General;  Laterality: N/A;  . Incision and drainage abscess N/A 02/14/2013    Procedure: INCISION AND DRAINAGE/DRESSING CHANGE;  Surgeon: Harl Bowie, MD;  Location: [place];  Service: General;  Laterality: N/A;  . Incision and drainage perirectal abscess N/A 02/16/2013    Procedure: IRRIGATION AND DEBRIDEMENT PERINEAL ABSCESS;  Surgeon: Zenovia Jarred, MD;  Location: [place];  Service: General;  Laterality: N/A;    FAMILY HISTORY: Family History  Problem Relation Age of Onset  . Diabetes Brother     SOCIAL HISTORY:  History   Social History  . Marital Status: Married    Spouse Name: N/A    Number of Children: 1  . Years of Education: college   Occupational History    Disabled   Social History Main Topics  . Smoking status:  Current Every Day Smoker -- 0.25 packs/day for 40 years    Types: Cigarettes  . Smokeless tobacco: Never Used  . Alcohol Use: 0.5 oz/week    1 drink(s) per week     Comment: OCC  . Drug Use: No     Comment: Quit four years ago.  Marland Kitchen Sexual Activity: Not on file   Other Topics Concern  . Not on file   Social History Narrative   Patient is separated  and lives at home with her friend. Patient is disabled.   Education one year of college   Right handed   Caffeine two cups daily.         PHYSICAL EXAM   Filed Vitals:   01/25/14 1258  Height: 5\' 5"  (1.651 m)  Weight: 294 lb (133.358 kg)    Not recorded    Body mass index is 48.92 kg/(m^2).   Generalized: In no acute distress  Neck: Supple, no carotid bruits   Cardiac: Regular rate rhythm  Pulmonary: Clear to auscultation bilaterally  Musculoskeletal: No deformity  Neurological examination  Mentation: Alert oriented to time, place, history taking, and causual conversation  Cranial nerve II-XII: Pupils were equal round reactive to light. Extraocular movements were full.  Visual field were full on confrontational test. Bilateral fundi were sharp.  Facial sensation and strength were normal. Hearing was intact to finger rubbing bilaterally. Uvula tongue midline.  Head turning and shoulder shrug and were normal and symmetric.Tongue protrusion into cheek strength was normal.  Motor: Normal tone, bulk and strength,  She has a surgical scar at left wrist, mild fixation of left arm upon rapid alternating movements,.  Sensory: Intact to fine touch, pinprick, preserved vibratory sensation, and proprioception at toes.  Coordination: Normal finger to nose, heel-to-shin bilaterally there was no truncal ataxia  Gait: Rising up from seated position without assistance, normal stance,  mild atalgic cautious gait,  Deep tendon reflexes: Hypoactive and symmetric plantar responses were flexor bilaterally.   DIAGNOSTIC DATA (LABS,  IMAGING, TESTING) - I reviewed patient records, labs, notes, testing and imaging myself where available.  Lab Results  Component Value Date   WBC 13.2* 12/23/2013   HGB 12.0 12/23/2013   HCT 36.4 12/23/2013   MCV 78* 12/23/2013  PLT 326 12/23/2013      Component Value Date/Time   NA 146* 12/23/2013 1132   NA 141 02/18/2013 0500   K 4.5 12/23/2013 1132   CL 104 12/23/2013 1132   CO2 22 12/23/2013 1132   GLUCOSE 86 12/23/2013 1132   GLUCOSE 119* 02/18/2013 0500   BUN 31* 12/23/2013 1132   BUN 9 02/18/2013 0500   CREATININE 1.38* 12/23/2013 1132   CALCIUM 9.9 12/23/2013 1132   PROT 7.3 12/23/2013 1132   PROT 5.8* 02/03/2013 0515   ALBUMIN 1.6* 02/11/2013 0500   AST 13 12/23/2013 1132   ALT 10 12/23/2013 1132   ALKPHOS 88 12/23/2013 1132   BILITOT 0.2 12/23/2013 1132   GFRNONAA 41* 12/23/2013 1132   GFRAA 47* 12/23/2013 1132   Lab Results  Component Value Date   CHOL  Value: 151        ATP III CLASSIFICATION:  <200     mg/dL   Desirable  200-239  mg/dL   Borderline High  >=240    mg/dL   High        11/28/2008   HDL 48 12/23/2013   LDLCALC 119* 12/23/2013   TRIG 103 12/23/2013   CHOLHDL 3.9 12/23/2013   Lab Results  Component Value Date   HGBA1C 7.5* 12/23/2013   Lab Results  Component Value Date   VITAMINB12 1058* 12/23/2013   Lab Results  Component Value Date   TSH 0.262* 12/23/2013      ASSESSMENT AND PLAN  [name] [name] is a 64 y.o. female  with vascular risk factor of obesity, hypertension, diabetes, hyperlipidemia, presenting with recurrent episode of left arm weakness, MRI confirmed small right frontal stroke, which consistent with her complaints, potential etiology of her stroke including small vessel, vs. cardiac embolic stroke, with her complaints of racing heart, she has developed black stool while taking aspirin, Plavix, and Pletal, per patient, stool occult blood study testing was negative, she is now only taking Plavix, no longer has black stool,  1. she is to continue  Plavix 2. Continue follow up with her cardiologist 3. return to clinic with Hoyle Sauer in 3-4 months    Marcial Pacas, M.D. Ph.D.  [facility] [address], [place], [place] [address] [phone]

## 2014-01-28 NOTE — Telephone Encounter (Signed)
Closed encounter °

## 2014-02-02 ENCOUNTER — Ambulatory Visit: Payer: PRIVATE HEALTH INSURANCE | Admitting: Cardiovascular Disease

## 2014-02-09 ENCOUNTER — Other Ambulatory Visit (HOSPITAL_COMMUNITY): Payer: Self-pay | Admitting: Orthopaedic Surgery

## 2014-02-09 DIAGNOSIS — M25559 Pain in unspecified hip: Secondary | ICD-10-CM

## 2014-02-12 ENCOUNTER — Encounter: Payer: Self-pay | Admitting: Cardiovascular Disease

## 2014-02-12 ENCOUNTER — Ambulatory Visit (INDEPENDENT_AMBULATORY_CARE_PROVIDER_SITE_OTHER): Payer: PRIVATE HEALTH INSURANCE | Admitting: Cardiovascular Disease

## 2014-02-12 VITALS — BP 136/74 | HR 87 | Ht 65.0 in | Wt 296.0 lb

## 2014-02-12 DIAGNOSIS — E785 Hyperlipidemia, unspecified: Secondary | ICD-10-CM

## 2014-02-12 DIAGNOSIS — E119 Type 2 diabetes mellitus without complications: Secondary | ICD-10-CM

## 2014-02-12 DIAGNOSIS — I10 Essential (primary) hypertension: Secondary | ICD-10-CM

## 2014-02-12 MED ORDER — LOSARTAN POTASSIUM 100 MG PO TABS: 100.0000 mg | ORAL_TABLET | Freq: Every day | ORAL | Status: DC

## 2014-02-12 NOTE — Assessment & Plan Note (Signed)
Controlled on current medications. She does say that she checks her blood pressure and her systolics are in the 008 range. I'm going to increase her losartan from 50-100 mg daily.

## 2014-02-12 NOTE — Assessment & Plan Note (Signed)
On statin therapy followed by her PCP 

## 2014-02-12 NOTE — Progress Notes (Signed)
02/12/2014 [name] [name]   03/12/1951  [id number]  Primary Physician [name], MD Primary Cardiologist: Lorretta Harp MD Renae Gloss   HPI:  [name] [name] is a very pleasant 63 year old severely overweight married African American female mother of one son, grandmother of 2 grandchildren referred for cardiovascular evaluation because of multiple cardiac risk factors and recent TIAs. Her neurologist is Dr. Krista Blue and her primary care physician is Dr. Shawna Orleans. Her cardiovascular risk factor profile is notable for 50 pack years of tobacco abuse continuing to smoke 7 cigarettes a day. Her mother died of a myocardial function age 80. She has treated hypertension, diabetes and hyperlipidemia. She has never had a heart attack. She apparently said to TIAs which resolved and has had a neurologic workup by Dr. Krista Blue. Her carotid Dopplers were unremarkable. And the monitor showed PVCs. A 2-D echo showed low normal LV function with an ejection fraction of 45-50% and question of a lateral wall motion abnormality. The patient was put on Plavix prophylactically.   Current Outpatient Prescriptions  Medication Sig Dispense Refill  . albuterol (PROVENTIL HFA;VENTOLIN HFA) 108 (90 BASE) MCG/ACT inhaler Inhale 2 puffs into the lungs every 6 (six) hours as needed for wheezing or shortness of breath.      . allopurinol (ZYLOPRIM) 100 MG tablet Take 100 mg by mouth daily.        . B Complex-C (B-COMPLEX WITH VITAMIN C) tablet Take 1 tablet by mouth 2 (two) times daily.      . Blood Glucose Monitoring Suppl (ONE TOUCH ULTRA MINI) W/DEVICE KIT       . clopidogrel (PLAVIX) 75 MG tablet Take 1 tablet (75 mg total) by mouth daily with breakfast.  30 tablet  12  . esomeprazole (NEXIUM) 40 MG capsule Take 40 mg by mouth daily before breakfast.        . etodolac (LODINE) 400 MG tablet 400 mg daily.      . fluticasone (FLONASE) 50 MCG/ACT nasal spray Place 2 sprays into the nose as needed for allergies.        . furosemide (LASIX) 40 MG tablet       . hydrochlorothiazide (,MICROZIDE/HYDRODIURIL,) 12.5 MG capsule Take 12.5 mg by mouth daily.        . insulin aspart protamine-insulin aspart (NOVOLOG 70/30) (70-30) 100 UNIT/ML injection Inject into the skin 2 (two) times daily with a meal.        . KLOR-CON M20 20 MEQ tablet Take 20 mEq by mouth once.       Marland Kitchen levothyroxine (SYNTHROID, LEVOTHROID) 150 MCG tablet Take 150 mcg by mouth daily.        Marland Kitchen losartan (COZAAR) 100 MG tablet Take 1 tablet (100 mg total) by mouth daily.  30 tablet  11  . Multiple Vitamin (MULTIVITAMIN WITH MINERALS) TABS Take 1 tablet by mouth daily.      . NON FORMULARY [place]      . omega-3 acid ethyl esters (LOVAZA) 1 G capsule Take by mouth 2 (two) times daily.      . ONE TOUCH ULTRA TEST test strip       . oxyCODONE-acetaminophen (PERCOCET) 10-325 MG per tablet Take 1 tablet by mouth every 6 (six) hours as needed for pain.  20 tablet  0  . rosuvastatin (CRESTOR) 10 MG tablet Take 1 tablet (10 mg total) by mouth daily.  30 tablet  12  . tiZANidine (ZANAFLEX) 4 MG tablet 4 mg daily.      Marland Kitchen  VESICARE 10 MG tablet Take 10 mg by mouth daily.        No current facility-administered medications for this visit.    Allergies  Allergen Reactions  . Ace Inhibitors     Tongue swell  . Morphine And Related Itching    History   Social History  . Marital Status: Married    Spouse Name: N/A    Number of Children: 1  . Years of Education: college   Occupational History  .      Disabled   Social History Main Topics  . Smoking status: Current Every Day Smoker -- 0.25 packs/day for 40 years    Types: Cigarettes  . Smokeless tobacco: Never Used  . Alcohol Use: 0.5 oz/week    1 drink(s) per week     Comment: OCC  . Drug Use: No     Comment: Quit four years ago.  Marland Kitchen Sexual Activity: Not on file   Other Topics Concern  . Not on file   Social History Narrative   Patient is se prated  and lives at home with her friend.  Patient is disabled.   Education one year of college   Right handed   Caffeine two cups daily.           Review of Systems: General: negative for chills, fever, night sweats or weight changes.  Cardiovascular: negative for chest pain, dyspnea on exertion, edema, orthopnea, palpitations, paroxysmal nocturnal dyspnea or shortness of breath Dermatological: negative for rash Respiratory: negative for cough or wheezing Urologic: negative for hematuria Abdominal: negative for nausea, vomiting, diarrhea, bright red blood per rectum, melena, or hematemesis Neurologic: negative for visual changes, syncope, or dizziness All other systems reviewed and are otherwise negative except as noted above.    Blood pressure 136/74, pulse 87, height '5\' 5"'  (1.651 m), weight 296 lb (134.265 kg).  General appearance: alert and no distress Neck: no adenopathy, no carotid bruit, no JVD, supple, symmetrical, trachea midline and thyroid not enlarged, symmetric, no tenderness/mass/nodules Lungs: clear to auscultation bilaterally Heart: regular rate and rhythm, S1, S2 normal, no murmur, click, rub or gallop Extremities: extremities normal, atraumatic, no cyanosis or edema and 1+ pedal pulses bilaterally  EKG normal sinus rhythm at 87 with left ventricular hypertrophy  ASSESSMENT AND PLAN:   HTN (hypertension) Controlled on current medications. She does say that she checks her blood pressure and her systolics are in the 403 range. I'm going to increase her losartan from 50-100 mg daily.  Hyperlipidemia On statin therapy followed by her PCP.      Lorretta Harp MD FACP,FACC,FAHA, [facility] 02/12/2014 8:47 AM

## 2014-02-12 NOTE — Patient Instructions (Addendum)
We request that you follow-up in: 6 months with an extender and in 1 year with Dr Andria Rhein will receive a reminder letter in the mail two months in advance. If you don't receive a letter, please call our office to schedule the follow-up appointment.  We have referred you to Estelle Grumbles, nutritionist.  I have made the referral for you.  Her number is [phone]  Increase your losartan to 100mg  daily

## 2014-02-23 ENCOUNTER — Encounter (HOSPITAL_COMMUNITY)
Admission: RE | Admit: 2014-02-23 | Discharge: 2014-02-23 | Disposition: A | Discharge status: Home / Self Care | Payer: PRIVATE HEALTH INSURANCE | Source: Ambulatory Visit | Attending: Orthopaedic Surgery | Admitting: Orthopaedic Surgery

## 2014-02-23 DIAGNOSIS — M25559 Pain in unspecified hip: Secondary | ICD-10-CM | POA: Insufficient documentation

## 2014-02-23 MED ORDER — TECHNETIUM TC 99M MEDRONATE IV KIT
25.0000 | PACK | Freq: Once | INTRAVENOUS | Status: AC | PRN
  Administered 2014-02-23: 25 via INTRAVENOUS

## 2014-05-07 ENCOUNTER — Ambulatory Visit: Payer: PRIVATE HEALTH INSURANCE | Admitting: Nurse Practitioner

## 2014-05-17 ENCOUNTER — Telehealth: Payer: Self-pay | Admitting: *Deleted

## 2014-05-17 NOTE — Telephone Encounter (Signed)
Calling to see if he can call me in a prescription for some cream to go on my toes.  I got that rash again or do I need to come in?  Please call me.  Have a blessed day.

## 2014-05-17 NOTE — Telephone Encounter (Signed)
You may prescribe the same medication she had before.

## 2014-05-18 MED ORDER — NAFTIFINE HCL 1 % EX CREA: TOPICAL_CREAM | Freq: Every day | CUTANEOUS | Status: DC

## 2014-05-18 NOTE — Telephone Encounter (Signed)
I called and informed her that Dr. Marta Antu the refill.  I sent to order to [facility] on [address].  She asked if we could send it to [facility] on [address].  I told her I would change it.

## 2014-05-20 ENCOUNTER — Encounter: Payer: Self-pay | Admitting: Nurse Practitioner

## 2014-05-20 ENCOUNTER — Ambulatory Visit (INDEPENDENT_AMBULATORY_CARE_PROVIDER_SITE_OTHER): Payer: PRIVATE HEALTH INSURANCE | Admitting: Nurse Practitioner

## 2014-05-20 VITALS — BP 158/77 | HR 87 | Ht 65.0 in | Wt 295.0 lb

## 2014-05-20 DIAGNOSIS — I10 Essential (primary) hypertension: Secondary | ICD-10-CM

## 2014-05-20 DIAGNOSIS — G459 Transient cerebral ischemic attack, unspecified: Secondary | ICD-10-CM

## 2014-05-20 NOTE — Patient Instructions (Addendum)
Continue Plavix for secondary stroke prevention Continue with cane for safe ambulation Keep B/P less than 683 systolic LDL cholesterol less than 70 F/U in 6 months

## 2014-05-20 NOTE — Progress Notes (Signed)
I agree with the assessment and plan as directed by NP .The patient is followed by Dr Alexandria Lodge, MD

## 2014-05-20 NOTE — Progress Notes (Signed)
[facility]  PATIENT: [name] [name] DOB: July 12, 1951   REASON FOR VISIT: Followup for TIA   HISTORY OF PRESENT ILLNESS: UPDATE 05/20/14  [name] [name], 63 year old female returns for followup. She was last seen by Dr. Krista Blue 01/26/2012. MRI of the brain showed 2 tiny acute subacute infarctions in the right frontal lobe with chronic periventricular white matter ischemic changes. 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 She is currently on Plavix with minimal bruising. She denies further stroke or TIA symptoms. She still complains of palpitations and follows with her cardiologist Dr. Gwenlyn Found. She is also having a lot of right hip pain and is due to see an orthopedist she continues to smoke. She has risk factors of hypertension, morbid obesity, insulin-dependent diabetic and hyperlipidemia. She returns for reevaluation  UPDATE April 20th 2015;  Ultrasound of carotid artery in January 2015: has demonstrate bilateral 1-39% internal carotid artery stenosis  Echocardiogram: Left ventricle: The cavity size was normal. There was moderate concentric hypertrophy. Systolic function was mildly reduced. The estimated ejection fraction was in the range of 45% to 50%. Possible hypokinesis of the lateral myocardium. Doppler parameters are consistent with abnormal left ventricular relaxation  She also had cardiac monitoring March 25th to 27th, 2015, which has demonstrated sinus bradycardia to sinus tachycardia, with heart rate ranging from 49-140 eats per minute, first degree AV block, recurrence of the brought T-wave, there was supraventricular ectopy, 222 single ventricular ectopy, she was made appointments with his cardiologist Dr. Gena Fray in May 8th 2015.  She also reported 2 weeks history of heart beating loud  tinnitus since early April 2015, she complained of dark black stool, asa was taken off by her PCP Dr. Rhys Martini, per patient, stool occult blood was negative, now she has no black stool anymore, she is still taking Plavix 75 mg a day, before she came to see me in March 2015, she was taking aspirin 81 mg daily, I have added Plavix to her treatment. 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  MRA of the neck was normal, patent bilateral carotid, and vertebral arteries.  Laboratory evaluation showed normal or negative Z61, folic acid, RPR, ANA, CMP, with exception of mild elevated creatinine 1.3 8, mild elevated WBC 13.7, MCV 78, Decreased LDL 119, normal CK-MB,  HISTORY:[name] [name] is a 63 years old AA female, referred by her primary care physician Dr. [name] for evaluation of left arm difficulty, initial visit March 18th 2015.  She has PMHx of obesity, HTN, DM, insulin-dependent, presented with recurrent episode of left arm difficulty  About 3 months ago, in December 2014, while sitting down watching TV, she felt left facial droopy, she massaged it for few minutes, gradually subsided,  2 months ago, in January 2015, while folding her cloth, reaching down, she noticed left arm weakness, has difficulty lifting it up against gravity, the blood pressure was 60/50s, when she checked on it, her symptoms  lost about few minutes, gradually subsided  The second episode was a few weeks ago, she was standing on, felt  fainting sensation, her left arm went limp, she has difficulty moving her left fourth and fifth fingers, lasting for 5 minutes, recovered after she lied down resting  Today, March 18th 2015, on her way here, around 9:30 AM, she felt her heart was acting out, irregular heart rate, feeling weird, lasting for a few minutes, she felt again left arm a little bit heavy, clumsy, she can still raise her left arm against gravity without difficulty, she has mild gait difficulty due to her right hip pain,  She had a history of right hip replacement. She denies significant chest pain, no shortness of breath    REVIEW OF SYSTEMS: Full 14 system review of systems performed and notable only for those listed, all others are neg:  Constitutional: Fatigue Cardiovascular: Palpitations  Ear/Nose/Throat: N/A  Skin: N/A  Eyes: Blurred vision Respiratory: Cough, wheezing Gastroitestinal: Urinary urgency Hematology/Lymphatic: N/A  Endocrine: N/A Musculoskeletal: Joint pain  Allergy/Immunology: N/A  Neurological: N/A Psychiatric: N/A Sleep : Daytime sleepiness   ALLERGIES: Allergies  Allergen Reactions  . Ace Inhibitors     Tongue swell  . Morphine And Related Itching    HOME MEDICATIONS: Outpatient Prescriptions Prior to Visit  Medication Sig Dispense Refill  . albuterol (PROVENTIL HFA;VENTOLIN HFA) 108 (90 BASE) MCG/ACT inhaler Inhale 2 puffs into the lungs every 6 (six) hours as needed for wheezing or shortness of breath.      . allopurinol (ZYLOPRIM) 100 MG tablet Take 100 mg by mouth daily.        . B Complex-C (B-COMPLEX WITH VITAMIN C) tablet Take 1 tablet by mouth 2 (two) times daily.      . Blood Glucose Monitoring Suppl (ONE TOUCH ULTRA MINI) W/DEVICE KIT       . clopidogrel (PLAVIX) 75 MG tablet Take 1 tablet (75 mg total) by mouth daily with breakfast.  30 tablet  12  . etodolac (LODINE) 400 MG tablet 400 mg daily.      . fluticasone (FLONASE) 50 MCG/ACT nasal spray Place 2 sprays into the  nose as needed for allergies.       . furosemide (LASIX) 40 MG tablet       . hydrochlorothiazide (,MICROZIDE/HYDRODIURIL,) 12.5 MG capsule Take 12.5 mg by mouth daily.        . insulin aspart protamine-insulin aspart (NOVOLOG 70/30) (70-30) 100 UNIT/ML injection Inject into the skin 2 (two) times daily with a meal.        . KLOR-CON M20 20 MEQ tablet Take 20 mEq by mouth once.       Marland Kitchen levothyroxine (SYNTHROID, LEVOTHROID) 150 MCG tablet Take 150 mcg by mouth daily.        Marland Kitchen losartan (COZAAR) 100 MG tablet Take 1 tablet (100 mg total) by mouth daily.  30 tablet  11  . Multiple Vitamin (MULTIVITAMIN WITH MINERALS) TABS Take 1 tablet by mouth daily.      . NON FORMULARY [place]      . omega-3 acid ethyl esters (LOVAZA) 1 G capsule Take by mouth 2 (two) times daily.      . ONE TOUCH ULTRA TEST test strip       . oxyCODONE-acetaminophen (PERCOCET) 10-325 MG per tablet Take 1 tablet by mouth every 6 (six) hours as needed for pain.  20 tablet  0  . rosuvastatin (CRESTOR) 10 MG tablet Take 1 tablet (10 mg total)  by mouth daily.  30 tablet  12  . VESICARE 10 MG tablet Take 10 mg by mouth daily.       Marland Kitchen esomeprazole (NEXIUM) 40 MG capsule Take 40 mg by mouth daily before breakfast.        . naftifine (NAFTIN) 1 % cream Apply topically daily.  90 g  1  . tiZANidine (ZANAFLEX) 4 MG tablet 4 mg daily.       No facility-administered medications prior to visit.    PAST MEDICAL HISTORY: Past Medical History  Diagnosis Date  . History of hip replacement, total   . Hx MRSA infection     abscess left groin  . Arthritis   . GERD (gastroesophageal reflux disease)   . Gout   . Diabetes mellitus   . Hypertension   . Thyroid disease   . Hyperlipidemia   . TIA (transient ischemic attack)     PAST SURGICAL HISTORY: Past Surgical History  Procedure Laterality Date  . Thyroidectomy    . Abdominal hysterectomy    . Ventral hernia repair    . Laparoscopic cholecystectomy    . Forearm fracture surgery       Left arm  . Joint replacement    . Hernia repair      w/ mesh  . Incision and drainage abscess Left 02/04/2013    Procedure: INCISION AND DRAINAGE LEFT BUTTOCK ABSCESS; INCISION AND DRAINAGE LEFT BREAST ABSCESS;  Surgeon: Harl Bowie, MD;  Location: [place];  Service: General;  Laterality: Left;  . Irrigation and debridement abscess N/A 02/06/2013    Procedure: IRRIGATION AND DEBRIDEMENT BUTTOCK ABSCESS AND DRESSING CHANGE;  Surgeon: Harl Bowie, MD;  Location: [place];  Service: General;  Laterality: N/A;  . Irrigation and debridement abscess Left 02/08/2013    Procedure: IRRIGATION AND DEBRIDEMENT ABSCESS/DRESSING CHANGE;  Surgeon: Gwenyth Ober, MD;  Location: [place];  Service: General;  Laterality: Left;  . Incision and drainage perirectal abscess Left 02/10/2013    Procedure: IRRIGATION AND DEBRIDEMENT OF BUTTOCK/PERINEAL ABSCESS;  Surgeon: [name]. Georgette Dover, MD;  Location: [place];  Service: General;  Laterality: Left;  . Incision and drainage abscess N/A 02/12/2013    Procedure: INCISION AND DEBRIDEMENT BUTTOCK WOUND ;  Surgeon: [name]. Georgette Dover, MD;  Location: [place];  Service: General;  Laterality: N/A;  . Incision and drainage abscess N/A 02/14/2013    Procedure: INCISION AND DRAINAGE/DRESSING CHANGE;  Surgeon: Harl Bowie, MD;  Location: [place];  Service: General;  Laterality: N/A;  . Incision and drainage perirectal abscess N/A 02/16/2013    Procedure: IRRIGATION AND DEBRIDEMENT PERINEAL ABSCESS;  Surgeon: Zenovia Jarred, MD;  Location: [place];  Service: General;  Laterality: N/A;    FAMILY HISTORY: Family History  Problem Relation Age of Onset  . Diabetes Brother     SOCIAL HISTORY: History   Social History  . Marital Status: Married    Spouse Name: N/A    Number of Children: 1  . Years of Education: college   Occupational History  .      Disabled   Social History Main Topics  . Smoking status: Current Every Day Smoker -- 0.25 packs/day for 40 years     Types: Cigarettes  . Smokeless tobacco: Never Used  . Alcohol Use: 0.5 oz/week    1 drink(s) per week     Comment: OCC  . Drug Use: No     Comment: Quit four years ago.  Marland Kitchen Sexual Activity: Not  on file   Other Topics Concern  . Not on file   Social History Narrative   Patient is se prated  and lives at home with her friend. Patient is disabled.   Education one year of college   Right handed   Caffeine two cups daily.           PHYSICAL EXAM  Filed Vitals:   05/20/14 0949  BP: 164/87  Pulse: 96  Height: '5\' 5"'  (1.651 m)  Weight: 295 lb (133.811 kg)   Body mass index is 49.09 kg/(m^2). Generalized: In no acute distress, morbidly obese  Neck: Supple, no carotid bruits  Cardiac: Regular rate rhythm  Pulmonary: Clear to auscultation bilaterally  Musculoskeletal: No deformity  Neurological examination  Mentation: Alert oriented to time, place, history taking, and causual conversation  Cranial nerve II-XII: Pupils were equal round reactive to light. Extraocular movements were full. Visual field were full on confrontational test. Bilateral fundi were sharp. Facial sensation and strength were normal. Hearing was intact to finger rubbing bilaterally. Uvula tongue midline. Head turning and shoulder shrug and were normal and symmetric.Tongue protrusion into cheek strength was normal.  Motor: Normal tone, bulk and strength, She has a surgical scar at left wrist, mild fixation of left arm upon rapid alternating movements,.  Sensory: Intact to fine touch, pinprick, preserved vibratory sensation, and proprioception at toes.  Coordination: Normal finger to nose, heel-to-shin bilaterally  Gait: Rising up from seated position without assistance, normal stance, mild atalgic cautious gait, ambulates with single-point cane Deep tendon reflexes: Hypoactive and symmetric plantar responses were flexor bilaterally.    DIAGNOSTIC DATA (LABS, IMAGING, TESTING) - I reviewed patient records, labs,  notes, testing and imaging myself where available.  Lab Results  Component Value Date   WBC 13.2* 12/23/2013   HGB 12.0 12/23/2013   HCT 36.4 12/23/2013   MCV 78* 12/23/2013   PLT 326 12/23/2013      Component Value Date/Time   NA 146* 12/23/2013 1132   NA 141 02/18/2013 0500   K 4.5 12/23/2013 1132   CL 104 12/23/2013 1132   CO2 22 12/23/2013 1132   GLUCOSE 86 12/23/2013 1132   GLUCOSE 119* 02/18/2013 0500   BUN 31* 12/23/2013 1132   BUN 9 02/18/2013 0500   CREATININE 1.38* 12/23/2013 1132   CALCIUM 9.9 12/23/2013 1132   PROT 7.3 12/23/2013 1132   PROT 5.8* 02/03/2013 0515   ALBUMIN 1.6* 02/11/2013 0500   AST 13 12/23/2013 1132   ALT 10 12/23/2013 1132   ALKPHOS 88 12/23/2013 1132   BILITOT 0.2 12/23/2013 1132   GFRNONAA 41* 12/23/2013 1132   GFRAA 47* 12/23/2013 1132   Lab Results  Component Value Date   CHOL  Value: 151        ATP III CLASSIFICATION:  <200     mg/dL   Desirable  200-239  mg/dL   Borderline High  >=240    mg/dL   High        11/28/2008   HDL 48 12/23/2013   LDLCALC 119* 12/23/2013   TRIG 103 12/23/2013   CHOLHDL 3.9 12/23/2013   Lab Results  Component Value Date   HGBA1C 7.5* 12/23/2013   Lab Results  Component Value Date   VITAMINB12 1058* 12/23/2013   Lab Results  Component Value Date   TSH 0.262* 12/23/2013      ASSESSMENT AND PLAN  63 y.o. year old female  has a past medical history of TIA with vascular risk factors of  obesity, hypertension, insulin-dependent diabetes hyperlipidemia. 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 She is currently on Plavix with minimal bruising. She is a patient of Dr. Krista Blue who is out of the office  Continue Plavix for secondary stroke prevention Continue with cane for safe ambulation Keep B/P less than 292 systolic todays reading 909/03. LDL cholesterol less than 70 Need to  think about sleep study F/U in 6 months Dennie Bible, [facility], [facility], APRN  [facility] [address], [place],  [place] [address] [phone]

## 2014-06-07 ENCOUNTER — Other Ambulatory Visit: Payer: Self-pay

## 2014-06-07 DIAGNOSIS — Z1231 Encounter for screening mammogram for malignant neoplasm of breast: Secondary | ICD-10-CM

## 2014-06-08 ENCOUNTER — Telehealth: Payer: Self-pay | Admitting: *Deleted

## 2014-06-08 DIAGNOSIS — Z9889 Other specified postprocedural states: Secondary | ICD-10-CM

## 2014-06-08 DIAGNOSIS — Z01818 Encounter for other preprocedural examination: Secondary | ICD-10-CM

## 2014-06-08 HISTORY — DX: Other specified postprocedural states: Z98.890

## 2014-06-08 NOTE — Telephone Encounter (Signed)
Dr Gwenlyn Found reviewed the chart and recommds that patient under a lexiscan myoview prior to hip surgery.   I called patient and she is agreeable to proceed with stress test for clearance

## 2014-06-09 ENCOUNTER — Other Ambulatory Visit (HOSPITAL_COMMUNITY): Payer: Self-pay | Admitting: Cardiovascular Disease

## 2014-06-09 ENCOUNTER — Telehealth (HOSPITAL_COMMUNITY): Payer: Self-pay | Admitting: *Deleted

## 2014-06-09 DIAGNOSIS — Z01818 Encounter for other preprocedural examination: Secondary | ICD-10-CM

## 2014-06-11 ENCOUNTER — Telehealth (HOSPITAL_COMMUNITY): Payer: Self-pay

## 2014-06-11 NOTE — Telephone Encounter (Signed)
Encounter complete. 

## 2014-06-15 ENCOUNTER — Telehealth (HOSPITAL_COMMUNITY): Payer: Self-pay

## 2014-06-15 NOTE — Telephone Encounter (Signed)
Encounter complete. 

## 2014-06-16 ENCOUNTER — Encounter (HOSPITAL_COMMUNITY): Payer: PRIVATE HEALTH INSURANCE

## 2014-06-17 ENCOUNTER — Encounter (HOSPITAL_COMMUNITY): Payer: PRIVATE HEALTH INSURANCE

## 2014-06-18 ENCOUNTER — Telehealth (HOSPITAL_COMMUNITY): Payer: Self-pay

## 2014-06-18 NOTE — Telephone Encounter (Signed)
Encounter complete. 

## 2014-06-22 ENCOUNTER — Telehealth (HOSPITAL_COMMUNITY): Payer: Self-pay

## 2014-06-22 NOTE — Telephone Encounter (Signed)
Encounter complete. 

## 2014-06-23 ENCOUNTER — Ambulatory Visit (HOSPITAL_COMMUNITY)
Admission: RE | Admit: 2014-06-23 | Discharge: 2014-06-23 | Disposition: A | Discharge status: Home / Self Care | Payer: Commercial Managed Care - HMO | Source: Ambulatory Visit | Attending: Cardiovascular Disease | Admitting: Cardiovascular Disease

## 2014-06-23 DIAGNOSIS — I10 Essential (primary) hypertension: Secondary | ICD-10-CM | POA: Insufficient documentation

## 2014-06-23 DIAGNOSIS — E785 Hyperlipidemia, unspecified: Secondary | ICD-10-CM | POA: Diagnosis not present

## 2014-06-23 DIAGNOSIS — Z8249 Family history of ischemic heart disease and other diseases of the circulatory system: Secondary | ICD-10-CM | POA: Diagnosis not present

## 2014-06-23 DIAGNOSIS — Z01818 Encounter for other preprocedural examination: Secondary | ICD-10-CM

## 2014-06-23 DIAGNOSIS — Z01810 Encounter for preprocedural cardiovascular examination: Secondary | ICD-10-CM

## 2014-06-23 MED ORDER — REGADENOSON 0.4 MG/5ML IV SOLN
0.4000 mg | Freq: Once | INTRAVENOUS | Status: AC
  Administered 2014-06-23: 0.4 mg via INTRAVENOUS

## 2014-06-23 MED ORDER — TECHNETIUM TC 99M SESTAMIBI GENERIC - CARDIOLITE
30.0000 | Freq: Once | INTRAVENOUS | Status: AC | PRN
  Administered 2014-06-23: 30 via INTRAVENOUS

## 2014-06-23 NOTE — Procedures (Addendum)
[facility] [address] [place] [address] [phone]  Cardiology Nuclear Med Study  [name] [name] is a 63 y.o. female     MRN : [id number]     DOB: 05-31-1951  Procedure Date: 06/23/2014  Nuclear Med Background Indication for Stress Test:  Surgical Clearance History:  Asthma and No prior cardiac history reported;No priorNUC MPI for comparison. Cardiac Risk Factors: Family History - CAD, Hypertension, Lipids, NIDDM, Obesity, PVD and TIA  Symptoms:  DOE and Left arm weakness!!!   Nuclear Pre-Procedure Caffeine/Decaff Intake:  1:00am NPO After: 11am   IV Site: R Forearm  IV 0.9% NS with Angio Cath:  22g  Chest Size (in):  n/a IV Started by: Rolene Course, RN  Height: 5\' 5"  (1.651 m)  Cup Size: D  BMI:  Body mass index is 49.26 kg/(m^2). Weight:  296 lb (134.265 kg)   Tech Comments:  n/a    Nuclear Med Study 1 or 2 day study: 2 day  Stress Test Type:  [place] Provider:  Quay Burow, MD   Resting Radionuclide: Technetium 29m Sestamibi  Resting Radionuclide Dose: 30.9 mCi   Stress Radionuclide:  Technetium 41m Sestamibi  Stress Radionuclide Dose: 29.2 mCi           Stress Protocol Rest HR: 90 Stress HR: 117  Rest BP:168/99 Stress BP: 172/72  Exercise Time (min): n/a METS: n/a          Dose of Adenosine (mg):  n/a Dose of Lexiscan: 0.4 mg  Dose of Atropine (mg): n/a Dose of Dobutamine: n/a mcg/kg/min (at max HR)  Stress Test Technologist: Mellody Memos, CCT Nuclear Technologist: Otho Perl, CNMT   Rest Procedure:  Myocardial perfusion imaging was performed at rest 45 minutes following the intravenous administration of Technetium 64m Sestamibi. Stress Procedure:  The patient received IV Lexiscan 0.4 mg over 15-seconds.  Technetium 26m Sestamibi injected IV at 30-seconds.  There were no significant changes with Lexiscan.  Quantitative spect images were obtained after a 45 minute  delay.  Transient Ischemic Dilatation (Normal <1.22):  0.82 QGS EDV:  163 ml QGS ESV:  111 ml LV Ejection Fraction: 32%  Rest ECG: NSR with IVCD  Stress ECG: No significant change from baseline ECG  QPS Raw Data Images:  Significant bowel activity, does not interfere with the study. There is a gating abnormality. Stress Images:  There is decreased uptake in the anterior wall. Rest Images:  Normal homogeneous uptake in all areas of the myocardium. Subtraction (SDS):  These findings are consistent with ischemia.  Impression Exercise Capacity:  Lexiscan with no exercise. BP Response:  Normal blood pressure response. Clinical Symptoms:  No symptoms. ECG Impression:  No significant ECG changes with Lexiscan. Comparison with Prior Nuclear Study: No previous nuclear study performed  Overall Impression:  High risk stress nuclear study with significant reversible anterior ischemia.  LV Wall Motion:  Global hypokinesis with EF of 32% ( there is gating abnormality in the raw images, therefore, the EF may not be accurate)  Pixie Casino, MD, [facility] Board Certified in Nuclear Cardiology Attending Cardiologist [place], MD  06/24/2014 1:09 PM

## 2014-06-24 ENCOUNTER — Encounter: Payer: Self-pay | Admitting: Cardiovascular Disease

## 2014-06-24 ENCOUNTER — Encounter (HOSPITAL_COMMUNITY): Payer: Medicare HMO

## 2014-06-24 ENCOUNTER — Ambulatory Visit (HOSPITAL_COMMUNITY)
Admission: RE | Admit: 2014-06-24 | Discharge: 2014-06-24 | Disposition: A | Discharge status: Home / Self Care | Payer: Commercial Managed Care - HMO | Source: Ambulatory Visit | Attending: Internal Medicine | Admitting: Internal Medicine

## 2014-06-24 DIAGNOSIS — Z8673 Personal history of transient ischemic attack (TIA), and cerebral infarction without residual deficits: Secondary | ICD-10-CM | POA: Insufficient documentation

## 2014-06-24 DIAGNOSIS — R9439 Abnormal result of other cardiovascular function study: Secondary | ICD-10-CM | POA: Diagnosis not present

## 2014-06-24 DIAGNOSIS — Z01818 Encounter for other preprocedural examination: Secondary | ICD-10-CM

## 2014-06-24 DIAGNOSIS — F17200 Nicotine dependence, unspecified, uncomplicated: Secondary | ICD-10-CM | POA: Diagnosis not present

## 2014-06-24 DIAGNOSIS — M7989 Other specified soft tissue disorders: Secondary | ICD-10-CM | POA: Diagnosis not present

## 2014-06-24 DIAGNOSIS — Z01810 Encounter for preprocedural cardiovascular examination: Secondary | ICD-10-CM

## 2014-06-24 DIAGNOSIS — R0602 Shortness of breath: Secondary | ICD-10-CM | POA: Insufficient documentation

## 2014-06-24 DIAGNOSIS — I10 Essential (primary) hypertension: Secondary | ICD-10-CM | POA: Diagnosis present

## 2014-06-24 DIAGNOSIS — E785 Hyperlipidemia, unspecified: Secondary | ICD-10-CM | POA: Diagnosis present

## 2014-06-24 MED ORDER — TECHNETIUM TC 99M SESTAMIBI GENERIC - CARDIOLITE
30.0000 | Freq: Once | INTRAVENOUS | Status: AC | PRN
  Administered 2014-06-24: 30 via INTRAVENOUS

## 2014-06-29 ENCOUNTER — Ambulatory Visit
Admission: RE | Admit: 2014-06-29 | Discharge: 2014-06-29 | Disposition: A | Discharge status: Home / Self Care | Payer: Medicare HMO | Source: Ambulatory Visit | Attending: Cardiovascular Disease | Admitting: Cardiovascular Disease

## 2014-06-29 ENCOUNTER — Encounter: Payer: Self-pay | Admitting: Cardiovascular Disease

## 2014-06-29 ENCOUNTER — Ambulatory Visit (INDEPENDENT_AMBULATORY_CARE_PROVIDER_SITE_OTHER): Payer: Commercial Managed Care - HMO | Admitting: Cardiovascular Disease

## 2014-06-29 VITALS — BP 156/75 | HR 85 | Ht 64.0 in | Wt 303.5 lb

## 2014-06-29 DIAGNOSIS — Z01818 Encounter for other preprocedural examination: Secondary | ICD-10-CM

## 2014-06-29 DIAGNOSIS — Z720 Tobacco use: Secondary | ICD-10-CM

## 2014-06-29 DIAGNOSIS — E785 Hyperlipidemia, unspecified: Secondary | ICD-10-CM

## 2014-06-29 DIAGNOSIS — I10 Essential (primary) hypertension: Secondary | ICD-10-CM

## 2014-06-29 DIAGNOSIS — F17200 Nicotine dependence, unspecified, uncomplicated: Secondary | ICD-10-CM

## 2014-06-29 DIAGNOSIS — R9439 Abnormal result of other cardiovascular function study: Secondary | ICD-10-CM

## 2014-06-29 LAB — CBC
HCT: 33.9 % — ABNORMAL LOW (ref 36.0–46.0)
Hemoglobin: 10.8 g/dL — ABNORMAL LOW (ref 12.0–15.0)
MCH: 25.9 pg — ABNORMAL LOW (ref 26.0–34.0)
MCHC: 31.9 g/dL (ref 30.0–36.0)
MCV: 81.3 fL (ref 78.0–100.0)
Platelets: 345 10*3/uL (ref 150–400)
RBC: 4.17 MIL/uL (ref 3.87–5.11)
RDW: 17.2 % — ABNORMAL HIGH (ref 11.5–15.5)
WBC: 10.9 10*3/uL — ABNORMAL HIGH (ref 4.0–10.5)

## 2014-06-29 LAB — BASIC METABOLIC PANEL
BUN: 40 mg/dL — ABNORMAL HIGH (ref 6–23)
CO2: 28 mEq/L (ref 19–32)
Calcium: 9.6 mg/dL (ref 8.4–10.5)
Chloride: 105 mEq/L (ref 96–112)
Creat: 1.32 mg/dL — ABNORMAL HIGH (ref 0.50–1.10)
Glucose, Bld: 89 mg/dL (ref 70–99)
Potassium: 4.1 mEq/L (ref 3.5–5.3)
Sodium: 146 mEq/L — ABNORMAL HIGH (ref 135–145)

## 2014-06-29 LAB — PROTIME-INR
INR: 1.03 (ref <1.50)
Prothrombin Time: 13.5 seconds (ref 11.6–15.2)

## 2014-06-29 LAB — APTT: aPTT: 39 seconds — ABNORMAL HIGH (ref 24–37)

## 2014-06-29 LAB — TSH: TSH: 0.505 u[IU]/mL (ref 0.350–4.500)

## 2014-06-29 NOTE — Progress Notes (Signed)
   06/29/2014 [name] [name]   04/14/1951  [id number]  Primary Physician AVBUERE,EDWIN A, MD Primary Cardiologist: Jonathan J. Berry MD FACP,FACC,FAHA, FSCAI   HPI:  Ms. [name] [name] is a very pleasant 63-year-old severely overweight married African American female mother of one son, grandmother of 2 grandchildren referred for cardiovascular evaluation because of multiple cardiac risk factors and recent TIAs. Her neurologist is Dr. Yan and her primary care physician is Dr. Avebeure. Her cardiovascular risk factor profile is notable for 50 pack years of tobacco abuse continuing to smoke 7 cigarettes a day. Her mother died of a myocardial function age 49. She has treated hypertension, diabetes and hyperlipidemia. She has never had a heart attack. She apparently said to TIAs which resolved and has had a neurologic workup by Dr. YAN. Her carotid Dopplers were unremarkable. And the monitor showed PVCs. A 2-D echo showed low normal LV function with an ejection fraction of 45-50% and question of a lateral wall motion abnormality. The patient was put on Plavix prophylactically. She had a Myoview stress test performed on 06/24/14 which was read as a high risk with anterolateral ischemia. She denies chest pain. She is scheduled for elective total hip replacement in November. Because of this she'll need to undergo elective outpatient cardiac catheterization to define anatomy and risk stratify her.    Current Outpatient Prescriptions  Medication Sig Dispense Refill  . allopurinol (ZYLOPRIM) 100 MG tablet Take 100 mg by mouth daily.        . B Complex-C (B-COMPLEX WITH VITAMIN C) tablet Take 1 tablet by mouth 2 (two) times daily.      . Blood Glucose Monitoring Suppl (ONE TOUCH ULTRA MINI) W/DEVICE KIT       . budesonide-formoterol (SYMBICORT) 160-4.5 MCG/ACT inhaler Inhale 2 puffs into the lungs 2 (two) times daily.      . Cholecalciferol (VITAMIN D) 400 UNIT/ML LIQD Take 400 Units by mouth.      . clopidogrel  (PLAVIX) 75 MG tablet Take 1 tablet (75 mg total) by mouth daily with breakfast.  30 tablet  12  . fluticasone (FLONASE) 50 MCG/ACT nasal spray Place 2 sprays into the nose as needed for allergies.       . furosemide (LASIX) 40 MG tablet       . gemfibrozil (LOPID) 600 MG tablet Take 600 mg by mouth 2 (two) times daily before a meal.      . hydrochlorothiazide (,MICROZIDE/HYDRODIURIL,) 12.5 MG capsule Take 12.5 mg by mouth daily.        . insulin aspart protamine-insulin aspart (NOVOLOG 70/30) (70-30) 100 UNIT/ML injection Inject into the skin 2 (two) times daily with a meal.        . KLOR-CON M20 20 MEQ tablet Take 20 mEq by mouth once.       . levothyroxine (SYNTHROID, LEVOTHROID) 150 MCG tablet Take 150 mcg by mouth daily.        . losartan (COZAAR) 100 MG tablet Take 1 tablet (100 mg total) by mouth daily.  30 tablet  11  . Multiple Vitamin (MULTIVITAMIN WITH MINERALS) TABS Take 1 tablet by mouth daily.      . omega-3 acid ethyl esters (LOVAZA) 1 G capsule Take by mouth 2 (two) times daily.      . ONE TOUCH ULTRA TEST test strip       . oxyCODONE-acetaminophen (PERCOCET) 10-325 MG per tablet Take 1 tablet by mouth every 6 (six) hours as needed for pain.  20 tablet    0  . rosuvastatin (CRESTOR) 10 MG tablet Take 1 tablet (10 mg total) by mouth daily.  30 tablet  12  . VESICARE 10 MG tablet Take 10 mg by mouth daily.        No current facility-administered medications for this visit.    Allergies  Allergen Reactions  . Ace Inhibitors     Tongue swell  . Morphine And Related Itching    History   Social History  . Marital Status: Married    Spouse Name: N/A    Number of Children: 1  . Years of Education: college   Occupational History  .      Disabled   Social History Main Topics  . Smoking status: Current Every Day Smoker -- 0.25 packs/day for 40 years    Types: Cigarettes  . Smokeless tobacco: Never Used  . Alcohol Use: 0.5 oz/week    1 drink(s) per week     Comment: OCC    . Drug Use: No     Comment: Quit four years ago.  . Sexual Activity: Not on file   Other Topics Concern  . Not on file   Social History Narrative   Patient is se prated  and lives at home with her friend. Patient is disabled.   Education one year of college   Right handed   Caffeine two cups daily.           Review of Systems: General: negative for chills, fever, night sweats or weight changes.  Cardiovascular: negative for chest pain, dyspnea on exertion, edema, orthopnea, palpitations, paroxysmal nocturnal dyspnea or shortness of breath Dermatological: negative for rash Respiratory: negative for cough or wheezing Urologic: negative for hematuria Abdominal: negative for nausea, vomiting, diarrhea, bright red blood per rectum, melena, or hematemesis Neurologic: negative for visual changes, syncope, or dizziness All other systems reviewed and are otherwise negative except as noted above.    Blood pressure 156/75, pulse 85, height 5' 4" (1.626 m), weight 303 lb 8 oz (137.667 kg).  General appearance: alert and no distress Neck: no adenopathy, no carotid bruit, no JVD, supple, symmetrical, trachea midline and thyroid not enlarged, symmetric, no tenderness/mass/nodules Lungs: clear to auscultation bilaterally Heart: regular rate and rhythm, S1, S2 normal, no murmur, click, rub or gallop Extremities: extremities normal, atraumatic, no cyanosis or edema  EKG not performed today  ASSESSMENT AND PLAN:   Positive cardiac stress test Ms. [name] [name] had a high-risk Myoview stress test performed 06/24/14 done for preoperative surgical clearance before elective total hip replacement. She has multiple cardiac risk factors. I'm going to perform an outpatient diagnostic coronary angiography via the right radial approach. I thoroughly discussed the risks and benefits.  Hyperlipidemia On Lopid, followed by her PCP  HTN (hypertension) Controlled on current medications      Jonathan J.  Berry MD FACP,FACC,FAHA, FSCAI 06/29/2014 10:07 AM  

## 2014-06-29 NOTE — Assessment & Plan Note (Signed)
[name] [name] had a high-risk Myoview stress test performed 06/24/14 done for preoperative surgical clearance before elective total hip replacement. She has multiple cardiac risk factors. I'm going to perform an outpatient diagnostic coronary angiography via the right radial approach. I thoroughly discussed the risks and benefits.

## 2014-06-29 NOTE — Assessment & Plan Note (Signed)
Controlled on current medications 

## 2014-06-29 NOTE — Patient Instructions (Signed)
Your physician has requested that you have a cardiac catheterization (radial). Cardiac catheterization is used to diagnose and/or treat various heart conditions. Doctors may recommend this procedure for a number of different reasons. The most common reason is to evaluate chest pain. Chest pain can be a symptom of coronary artery disease (CAD), and cardiac catheterization can show whether plaque is narrowing or blocking your heart's arteries. This procedure is also used to evaluate the valves, as well as measure the blood flow and oxygen levels in different parts of your heart. For further information please visit [email or web address].   Following your catheterization, you will not be allowed to drive for 3 days.  No lifting, pushing, or pulling greater that 10 pounds is allowed for 1 week.  You will be required to have the following tests prior to the procedure:  1. Blood work-the blood work can be done no more than 7 days prior to the procedure.  It can be done at any [facility] lab.  There is one downstairs on the first floor of this building and one in the [place] ([address])  2. Chest Xray-the chest xray order has already been placed at the [facility].

## 2014-06-29 NOTE — Assessment & Plan Note (Signed)
On Lopid, followed by her PCP

## 2014-07-01 ENCOUNTER — Encounter (HOSPITAL_COMMUNITY): Payer: Self-pay | Admitting: Pharmacy Technician

## 2014-07-02 ENCOUNTER — Ambulatory Visit (HOSPITAL_COMMUNITY)
Admission: RE | Admit: 2014-07-02 | Discharge: 2014-07-02 | Disposition: A | Discharge status: Home / Self Care | Payer: Medicare HMO | Source: Ambulatory Visit | Attending: Cardiovascular Disease | Admitting: Cardiovascular Disease

## 2014-07-02 ENCOUNTER — Encounter (HOSPITAL_COMMUNITY)
Admission: RE | Disposition: A | Discharge status: Home / Self Care | Payer: Self-pay | Source: Ambulatory Visit | Attending: Cardiovascular Disease

## 2014-07-02 DIAGNOSIS — E119 Type 2 diabetes mellitus without complications: Secondary | ICD-10-CM | POA: Diagnosis not present

## 2014-07-02 DIAGNOSIS — Z01818 Encounter for other preprocedural examination: Secondary | ICD-10-CM

## 2014-07-02 DIAGNOSIS — E663 Overweight: Secondary | ICD-10-CM | POA: Diagnosis not present

## 2014-07-02 DIAGNOSIS — Z794 Long term (current) use of insulin: Secondary | ICD-10-CM | POA: Insufficient documentation

## 2014-07-02 DIAGNOSIS — F17200 Nicotine dependence, unspecified, uncomplicated: Secondary | ICD-10-CM | POA: Diagnosis not present

## 2014-07-02 DIAGNOSIS — E785 Hyperlipidemia, unspecified: Secondary | ICD-10-CM | POA: Insufficient documentation

## 2014-07-02 DIAGNOSIS — I10 Essential (primary) hypertension: Secondary | ICD-10-CM | POA: Insufficient documentation

## 2014-07-02 DIAGNOSIS — Z8673 Personal history of transient ischemic attack (TIA), and cerebral infarction without residual deficits: Secondary | ICD-10-CM | POA: Insufficient documentation

## 2014-07-02 DIAGNOSIS — R9439 Abnormal result of other cardiovascular function study: Secondary | ICD-10-CM | POA: Diagnosis present

## 2014-07-02 DIAGNOSIS — Z7902 Long term (current) use of antithrombotics/antiplatelets: Secondary | ICD-10-CM | POA: Insufficient documentation

## 2014-07-02 DIAGNOSIS — Z720 Tobacco use: Secondary | ICD-10-CM

## 2014-07-02 HISTORY — PX: LEFT HEART CATHETERIZATION WITH CORONARY ANGIOGRAM: SHX5451

## 2014-07-02 LAB — GLUCOSE, CAPILLARY
Glucose-Capillary: 129 mg/dL — ABNORMAL HIGH (ref 70–99)
Glucose-Capillary: 108 mg/dL — ABNORMAL HIGH (ref 70–99)

## 2014-07-02 SURGERY — LEFT HEART CATHETERIZATION WITH CORONARY ANGIOGRAM
Anesthesia: LOCAL

## 2014-07-02 MED ORDER — HEPARIN SODIUM (PORCINE) 1000 UNIT/ML IJ SOLN
INTRAMUSCULAR | Status: AC
  Filled 2014-07-02: qty 1

## 2014-07-02 MED ORDER — LIDOCAINE HCL (PF) 1 % IJ SOLN
INTRAMUSCULAR | Status: AC
  Filled 2014-07-02: qty 30

## 2014-07-02 MED ORDER — SODIUM CHLORIDE 0.9 % IJ SOLN: 3.0000 mL | INTRAMUSCULAR | Status: DC | PRN

## 2014-07-02 MED ORDER — HEPARIN (PORCINE) IN NACL 2-0.9 UNIT/ML-% IJ SOLN
INTRAMUSCULAR | Status: AC
  Filled 2014-07-02: qty 1000

## 2014-07-02 MED ORDER — SODIUM CHLORIDE 0.9 % IV SOLN
INTRAVENOUS | Status: DC
  Administered 2014-07-02: 09:00:00 via INTRAVENOUS

## 2014-07-02 MED ORDER — NITROGLYCERIN 1 MG/10 ML FOR IR/CATH LAB
INTRA_ARTERIAL | Status: AC
  Filled 2014-07-02: qty 10

## 2014-07-02 MED ORDER — ASPIRIN 81 MG PO CHEW
CHEWABLE_TABLET | ORAL | Status: AC
  Administered 2014-07-02: 81 mg via ORAL
  Filled 2014-07-02: qty 1

## 2014-07-02 MED ORDER — SODIUM CHLORIDE 0.9 % IV SOLN: INTRAVENOUS | Status: AC

## 2014-07-02 MED ORDER — ONDANSETRON HCL 4 MG/2ML IJ SOLN: 4.0000 mg | Freq: Four times a day (QID) | INTRAMUSCULAR | Status: DC | PRN

## 2014-07-02 MED ORDER — FENTANYL CITRATE 0.05 MG/ML IJ SOLN
INTRAMUSCULAR | Status: AC
  Filled 2014-07-02: qty 2

## 2014-07-02 MED ORDER — MIDAZOLAM HCL 2 MG/2ML IJ SOLN
INTRAMUSCULAR | Status: AC
  Filled 2014-07-02: qty 2

## 2014-07-02 MED ORDER — VERAPAMIL HCL 2.5 MG/ML IV SOLN
INTRAVENOUS | Status: AC
  Filled 2014-07-02: qty 2

## 2014-07-02 MED ORDER — ASPIRIN 81 MG PO CHEW
81.0000 mg | CHEWABLE_TABLET | ORAL | Status: AC
  Administered 2014-07-02: 81 mg via ORAL

## 2014-07-02 MED ORDER — HYDRALAZINE HCL 20 MG/ML IJ SOLN: 10.0000 mg | INTRAMUSCULAR | Status: DC | PRN

## 2014-07-02 MED ORDER — ACETAMINOPHEN 325 MG PO TABS: 650.0000 mg | ORAL_TABLET | ORAL | Status: DC | PRN

## 2014-07-02 NOTE — Discharge Instructions (Signed)
Radial Site Care Refer to this sheet in the next few weeks. These instructions provide you with information on caring for yourself after your procedure. Your caregiver may also give you more specific instructions. Your treatment has been planned according to current medical practices, but problems sometimes occur. Call your caregiver if you have any problems or questions after your procedure. HOME CARE INSTRUCTIONS  You may shower the day after the procedure.Remove the bandage (dressing) and gently wash the site with plain soap and water.Gently pat the site dry.  Do not apply powder or lotion to the site.  Do not submerge the affected site in water for 3 to 5 days.  Inspect the site at least twice daily.  Do not flex or bend the affected arm for 24 hours.  No lifting over 5 pounds (2.3 kg) for 5 days after your procedure.  Do not drive home if you are discharged the same day of the procedure. Have someone else drive you.  You may drive 87-56 hours after the procedure unless otherwise instructed by your caregiver.  Do not operate machinery or power tools for 48 hours.  A responsible adult should be with you for the first 24 hours after you arrive home. What to expect:  Any bruising will usually fade within 1 to 2 weeks.  Blood that collects in the tissue (hematoma) may be painful to the touch. It should usually decrease in size and tenderness within 1 to 2 weeks. SEEK IMMEDIATE MEDICAL CARE IF:  You have unusual pain at the radial site.  You have redness, warmth, swelling, or pain at the radial site.  You have drainage (other than a small amount of blood on the dressing).  You have chills.  You have a fever or persistent symptoms for more than 72 hours.  You have a fever and your symptoms suddenly get worse.  Your arm becomes pale, cool, tingly, or numb.  You have heavy bleeding from the site. Hold pressure on the site and call 911. Document Released: 10/27/2010 Document  Revised: 12/17/2011 Document Reviewed: 10/27/2010 [facility] Patient Information 2015 [place], [place]. This information is not intended to replace advice given to you by your health care provider. Make sure you discuss any questions you have with your health care provider.

## 2014-07-02 NOTE — Interval H&P Note (Signed)
Cath Lab Visit (complete for each Cath Lab visit)  Clinical Evaluation Leading to the Procedure:   ACS: No.  Non-ACS:    Anginal Classification: No Symptoms  Anti-ischemic medical therapy: No Therapy  Non-Invasive Test Results: High-risk stress test findings: cardiac mortality >3%/year  Prior CABG: No previous CABG      History and Physical Interval Note:  07/02/2014 10:17 AM  [name] [name]  has presented today for surgery, with the diagnosis of blockage/adnormal stress  The various methods of treatment have been discussed with the patient and family. After consideration of risks, benefits and other options for treatment, the patient has consented to  Procedure(s): LEFT HEART CATHETERIZATION WITH CORONARY ANGIOGRAM (N/A) as a surgical intervention .  The patient's history has been reviewed, patient examined, no change in status, stable for surgery.  I have reviewed the patient's chart and labs.  Questions were answered to the patient's satisfaction.     Lorretta Harp

## 2014-07-02 NOTE — H&P (View-Only) (Signed)
   06/29/2014 [name] [name]   07/10/1951  [id number]  Primary Physician AVBUERE,[name] A, MD Primary Cardiologist: [name] J. Berry MD FACP,FACC,FAHA, FSCAI   HPI:  Ms. [name] [name] is a very pleasant 63-year-old severely overweight married African American female mother of one son, grandmother of 2 grandchildren referred for cardiovascular evaluation because of multiple cardiac risk factors and recent TIAs. Her neurologist is Dr. Yan and her primary care physician is Dr. Avebeure. Her cardiovascular risk factor profile is notable for 50 pack years of tobacco abuse continuing to smoke 7 cigarettes a day. Her mother died of a myocardial function age 49. She has treated hypertension, diabetes and hyperlipidemia. She has never had a heart attack. She apparently said to TIAs which resolved and has had a neurologic workup by Dr. YAN. Her carotid Dopplers were unremarkable. And the monitor showed PVCs. A 2-D echo showed low normal LV function with an ejection fraction of 45-50% and question of a lateral wall motion abnormality. The patient was put on Plavix prophylactically. She had a Myoview stress test performed on 06/24/14 which was read as a high risk with anterolateral ischemia. She denies chest pain. She is scheduled for elective total hip replacement in November. Because of this she'll need to undergo elective outpatient cardiac catheterization to define anatomy and risk stratify her.    Current Outpatient Prescriptions  Medication Sig Dispense Refill  . allopurinol (ZYLOPRIM) 100 MG tablet Take 100 mg by mouth daily.        . B Complex-C (B-COMPLEX WITH VITAMIN C) tablet Take 1 tablet by mouth 2 (two) times daily.      . Blood Glucose Monitoring Suppl (ONE TOUCH ULTRA MINI) W/DEVICE KIT       . budesonide-formoterol (SYMBICORT) 160-4.5 MCG/ACT inhaler Inhale 2 puffs into the lungs 2 (two) times daily.      . Cholecalciferol (VITAMIN D) 400 UNIT/ML LIQD Take 400 Units by mouth.      . clopidogrel  (PLAVIX) 75 MG tablet Take 1 tablet (75 mg total) by mouth daily with breakfast.  30 tablet  12  . fluticasone (FLONASE) 50 MCG/ACT nasal spray Place 2 sprays into the nose as needed for allergies.       . furosemide (LASIX) 40 MG tablet       . gemfibrozil (LOPID) 600 MG tablet Take 600 mg by mouth 2 (two) times daily before a meal.      . hydrochlorothiazide (,MICROZIDE/HYDRODIURIL,) 12.5 MG capsule Take 12.5 mg by mouth daily.        . insulin aspart protamine-insulin aspart (NOVOLOG 70/30) (70-30) 100 UNIT/ML injection Inject into the skin 2 (two) times daily with a meal.        . KLOR-CON M20 20 MEQ tablet Take 20 mEq by mouth once.       . levothyroxine (SYNTHROID, LEVOTHROID) 150 MCG tablet Take 150 mcg by mouth daily.        . losartan (COZAAR) 100 MG tablet Take 1 tablet (100 mg total) by mouth daily.  30 tablet  11  . Multiple Vitamin (MULTIVITAMIN WITH MINERALS) TABS Take 1 tablet by mouth daily.      . omega-3 acid ethyl esters (LOVAZA) 1 G capsule Take by mouth 2 (two) times daily.      . ONE TOUCH ULTRA TEST test strip       . oxyCODONE-acetaminophen (PERCOCET) 10-325 MG per tablet Take 1 tablet by mouth every 6 (six) hours as needed for pain.  20 tablet    0  . rosuvastatin (CRESTOR) 10 MG tablet Take 1 tablet (10 mg total) by mouth daily.  30 tablet  12  . VESICARE 10 MG tablet Take 10 mg by mouth daily.        No current facility-administered medications for this visit.    Allergies  Allergen Reactions  . Ace Inhibitors     Tongue swell  . Morphine And Related Itching    History   Social History  . Marital Status: Married    Spouse Name: N/A    Number of Children: 1  . Years of Education: college   Occupational History  .      Disabled   Social History Main Topics  . Smoking status: Current Every Day Smoker -- 0.25 packs/day for 40 years    Types: Cigarettes  . Smokeless tobacco: Never Used  . Alcohol Use: 0.5 oz/week    1 drink(s) per week     Comment: OCC    . Drug Use: No     Comment: Quit four years ago.  . Sexual Activity: Not on file   Other Topics Concern  . Not on file   Social History Narrative   Patient is se prated  and lives at home with her friend. Patient is disabled.   Education one year of college   Right handed   Caffeine two cups daily.           Review of Systems: General: negative for chills, fever, night sweats or weight changes.  Cardiovascular: negative for chest pain, dyspnea on exertion, edema, orthopnea, palpitations, paroxysmal nocturnal dyspnea or shortness of breath Dermatological: negative for rash Respiratory: negative for cough or wheezing Urologic: negative for hematuria Abdominal: negative for nausea, vomiting, diarrhea, bright red blood per rectum, melena, or hematemesis Neurologic: negative for visual changes, syncope, or dizziness All other systems reviewed and are otherwise negative except as noted above.    Blood pressure 156/75, pulse 85, height 5' 4" (1.626 m), weight 303 lb 8 oz (137.667 kg).  General appearance: alert and no distress Neck: no adenopathy, no carotid bruit, no JVD, supple, symmetrical, trachea midline and thyroid not enlarged, symmetric, no tenderness/mass/nodules Lungs: clear to auscultation bilaterally Heart: regular rate and rhythm, S1, S2 normal, no murmur, click, rub or gallop Extremities: extremities normal, atraumatic, no cyanosis or edema  EKG not performed today  ASSESSMENT AND PLAN:   Positive cardiac stress test [name] had a high-risk Myoview stress test performed 06/24/14 done for preoperative surgical clearance before elective total hip replacement. She has multiple cardiac risk factors. I'm going to perform an outpatient diagnostic coronary angiography via the right radial approach. I thoroughly discussed the risks and benefits.  Hyperlipidemia On Lopid, followed by her PCP  HTN (hypertension) Controlled on current medications      [name] J.  Berry MD FACP,FACC,FAHA, FSCAI 06/29/2014 10:07 AM  

## 2014-07-02 NOTE — CV Procedure (Signed)
[name] [name] is a 63 y.o. female    [id number] LOCATION:  FACILITY: [facility]  PHYSICIAN: Quay Burow, M.D. [birth year]   DATE OF PROCEDURE:  07/02/2014  DATE OF DISCHARGE:     CARDIAC CATHETERIZATION     History obtained from chart review.[name] is a very pleasant 63 year old severely overweight married African American female mother of one son, grandmother of 2 grandchildren referred for cardiovascular evaluation because of multiple cardiac risk factors and recent TIAs. Her neurologist is Dr. Krista Blue and her primary care physician is Dr. Shawna Orleans. Her cardiovascular risk factor profile is notable for 50 pack years of tobacco abuse continuing to smoke 7 cigarettes a day. Her mother died of a myocardial function age 75. She has treated hypertension, diabetes and hyperlipidemia. She has never had a heart attack. She apparently said to TIAs which resolved and has had a neurologic workup by Dr. Krista Blue. Her carotid Dopplers were unremarkable. And the monitor showed PVCs. A 2-D echo showed low normal LV function with an ejection fraction of 45-50% and question of a lateral wall motion abnormality. The patient was put on Plavix prophylactically. She had a Myoview stress test performed on 06/24/14 which was read as a high risk with anterolateral ischemia. She denies chest pain. She is scheduled for elective total hip replacement in November. Because of this she'll need to undergo elective outpatient cardiac catheterization to define anatomy and risk stratify her.    PROCEDURE DESCRIPTION:   The patient was brought to the second floor [facility] cath lab in the postabsorptive state. She was premedicated with Valium 5 mg by mouth. Her right wristwas prepped and shaved in usual sterile fashion. Xylocaine 1% was used  for local anesthesia. A 5 French sheath was inserted into the right radial artery using standard Seldinger technique. The patient received 6000 units  of heparin  intravenously.  A 5  French right and left Judkins method catheters and pigtail catheters were used for selective coronary angiography and left ventriculography respectively.Omnipaque dye was used for the entirety of the case (80 cc administered the patient). Retrograde aorta, left ventricular end pullback pressures were recorded.    HEMODYNAMICS:    AO SYSTOLIC/AO DIASTOLIC: 956/38   LV SYSTOLIC/LV DIASTOLIC: 756/43  ANGIOGRAPHIC RESULTS:   1. Left main; normal  2. LAD; normal 3. Left circumflex; left dominant and normal.  4. Right coronary artery; nondominant and normal 5. Left ventriculography; RAO left ventriculogram was performed using  25 mL of Visipaque dye at 12 mL/second. The overall LVEF estimated  40-45 % Without wall motion abnormalities  IMPRESSION:Ms. Seier has normal coronary arteries and mild LV dysfunction. She is left dominant. I believe her Myoview was false positive because of her body habitus. She'll be cleared for her upcoming hip surgery at low cardiovascular risk. The sheath was removed and a TR Band was placed on the right wrist to achieve patent hemostasis. The patient left the lab in stable condition. She'll be hydrated for 2 hours and discharged home.  Lorretta Harp MD, [facility] 07/02/2014 11:05 AM

## 2014-07-05 ENCOUNTER — Encounter: Payer: Self-pay | Admitting: *Deleted

## 2014-07-06 ENCOUNTER — Telehealth: Payer: Self-pay | Admitting: *Deleted

## 2014-07-06 NOTE — Telephone Encounter (Signed)
Dr Gwenlyn Found cleared patient for her hip surgery.  He signed form for [facility] and form was faxed back.   Patient notified

## 2014-07-15 ENCOUNTER — Ambulatory Visit
Admission: RE | Admit: 2014-07-15 | Discharge: 2014-07-15 | Disposition: A | Discharge status: Home / Self Care | Payer: Commercial Managed Care - HMO | Source: Ambulatory Visit

## 2014-07-15 DIAGNOSIS — Z1231 Encounter for screening mammogram for malignant neoplasm of breast: Secondary | ICD-10-CM

## 2014-07-22 ENCOUNTER — Other Ambulatory Visit: Payer: Self-pay | Admitting: Orthopedic Surgery

## 2014-07-22 NOTE — Progress Notes (Signed)
Preoperative surgical orders have been place into the Epic hospital system for [name] [name] on 07/22/2014, 1:08 PM  by Mickel Crow for surgery on 08/11/2014.  Preop Total Hip orders including Experel Injecion, IV Tylenol, and IV Decadron as long as there are no contraindications to the above medications. Arlee Muslim, PA-C

## 2014-07-27 ENCOUNTER — Telehealth: Payer: Self-pay | Admitting: Neurology

## 2014-07-27 NOTE — Telephone Encounter (Signed)
[name] [name], I have generated a letter dated Oct 20th 2015, please fax over to [facility]

## 2014-07-27 NOTE — Telephone Encounter (Signed)
Drew from [facility] calling to check if it is safe for patient to come off of Plavix before surgery and if so, for how long. Please return call and advise.

## 2014-07-29 NOTE — Telephone Encounter (Signed)
Note has been faxed.

## 2014-08-02 ENCOUNTER — Encounter (HOSPITAL_COMMUNITY): Payer: Self-pay | Admitting: Pharmacy Technician

## 2014-08-03 ENCOUNTER — Other Ambulatory Visit: Payer: Self-pay

## 2014-08-03 MED ORDER — LOSARTAN POTASSIUM 100 MG PO TABS: 100.0000 mg | ORAL_TABLET | ORAL | Status: DC

## 2014-08-03 NOTE — Telephone Encounter (Signed)
Rx was sent to pharmacy electronically. 

## 2014-08-04 ENCOUNTER — Encounter (HOSPITAL_COMMUNITY): Payer: Self-pay

## 2014-08-04 ENCOUNTER — Ambulatory Visit (HOSPITAL_COMMUNITY)
Admission: RE | Admit: 2014-08-04 | Discharge: 2014-08-04 | Disposition: A | Discharge status: Home / Self Care | Payer: Medicare HMO | Source: Ambulatory Visit | Attending: Orthopedic Surgery | Admitting: Orthopedic Surgery

## 2014-08-04 ENCOUNTER — Encounter (HOSPITAL_COMMUNITY)
Admission: RE | Admit: 2014-08-04 | Discharge: 2014-08-04 | Disposition: A | Discharge status: Home / Self Care | Payer: Medicare HMO | Source: Ambulatory Visit | Attending: Orthopedic Surgery | Admitting: Orthopedic Surgery

## 2014-08-04 DIAGNOSIS — Z01818 Encounter for other preprocedural examination: Secondary | ICD-10-CM | POA: Diagnosis not present

## 2014-08-04 DIAGNOSIS — X58XXXA Exposure to other specified factors, initial encounter: Secondary | ICD-10-CM | POA: Insufficient documentation

## 2014-08-04 DIAGNOSIS — T84030S Mechanical loosening of internal right hip prosthetic joint, sequela: Secondary | ICD-10-CM | POA: Insufficient documentation

## 2014-08-04 HISTORY — DX: Cramp and spasm: R25.2

## 2014-08-04 HISTORY — DX: Mechanical loosening of other internal prosthetic joint, initial encounter: T84.038A

## 2014-08-04 HISTORY — DX: Headache, unspecified: R51.9

## 2014-08-04 HISTORY — DX: Presence of unspecified artificial hip joint: Z96.649

## 2014-08-04 HISTORY — DX: Personal history of other medical treatment: Z92.89

## 2014-08-04 HISTORY — DX: Unspecified asthma, uncomplicated: J45.909

## 2014-08-04 HISTORY — DX: Stress incontinence (female) (male): N39.3

## 2014-08-04 HISTORY — DX: Sleep apnea, unspecified: G47.30

## 2014-08-04 HISTORY — DX: Other specified postprocedural states: Z98.890

## 2014-08-04 HISTORY — DX: Headache: R51

## 2014-08-04 LAB — APTT: aPTT: 43 seconds — ABNORMAL HIGH (ref 24–37)

## 2014-08-04 LAB — COMPREHENSIVE METABOLIC PANEL
ALT: 9 U/L (ref 0–35)
AST: 17 U/L (ref 0–37)
Albumin: 3.4 g/dL — ABNORMAL LOW (ref 3.5–5.2)
Alkaline Phosphatase: 86 U/L (ref 39–117)
Anion gap: 12 (ref 5–15)
BUN: 40 mg/dL — ABNORMAL HIGH (ref 6–23)
CO2: 29 mEq/L (ref 19–32)
Calcium: 9.5 mg/dL (ref 8.4–10.5)
Chloride: 99 mEq/L (ref 96–112)
Creatinine, Ser: 1.65 mg/dL — ABNORMAL HIGH (ref 0.50–1.10)
GFR calc Af Amer: 37 mL/min — ABNORMAL LOW (ref >90)
GFR calc non Af Amer: 32 mL/min — ABNORMAL LOW (ref >90)
Glucose, Bld: 96 mg/dL (ref 70–99)
Potassium: 3.7 mEq/L (ref 3.7–5.3)
Sodium: 140 mEq/L (ref 137–147)
Total Bilirubin: <0.2 mg/dL — ABNORMAL LOW (ref 0.3–1.2)
Total Protein: 7.8 g/dL (ref 6.0–8.3)

## 2014-08-04 LAB — PROTIME-INR
INR: 1.04 (ref 0.00–1.49)
Prothrombin Time: 13.7 seconds (ref 11.6–15.2)

## 2014-08-04 LAB — URINALYSIS, ROUTINE W REFLEX MICROSCOPIC
Bilirubin Urine: NEGATIVE
Glucose, UA: NEGATIVE mg/dL
Hgb urine dipstick: NEGATIVE
Ketones, ur: NEGATIVE mg/dL
Nitrite: NEGATIVE
Protein, ur: NEGATIVE mg/dL
Specific Gravity, Urine: 1.015 (ref 1.005–1.030)
Urobilinogen, UA: 0.2 mg/dL (ref 0.0–1.0)
pH: 5.5 (ref 5.0–8.0)

## 2014-08-04 LAB — CBC
HCT: 37.1 % (ref 36.0–46.0)
Hemoglobin: 11.3 g/dL — ABNORMAL LOW (ref 12.0–15.0)
MCH: 26.3 pg (ref 26.0–34.0)
MCHC: 30.5 g/dL (ref 30.0–36.0)
MCV: 86.5 fL (ref 78.0–100.0)
Platelets: 334 10*3/uL (ref 150–400)
RBC: 4.29 MIL/uL (ref 3.87–5.11)
RDW: 16.2 % — ABNORMAL HIGH (ref 11.5–15.5)
WBC: 11.6 10*3/uL — ABNORMAL HIGH (ref 4.0–10.5)

## 2014-08-04 LAB — URINE MICROSCOPIC-ADD ON

## 2014-08-04 LAB — SURGICAL PCR SCREEN
MRSA, PCR: NEGATIVE
Staphylococcus aureus: NEGATIVE

## 2014-08-04 NOTE — Patient Instructions (Signed)
[name] [name]  08/04/2014                           YOUR PROCEDURE IS SCHEDULED ON:  08/11/14                ENTER FROM [address] - GO TO [address] DECK               LOOK FOR [facility]  / [employer]                              FOLLOW  SIGNS TO SHORT STAY CENTER                 ARRIVE AT SHORT STAY AT: 12:45 PM               CALL THIS NUMBER IF ANY PROBLEMS THE DAY OF SURGERY :               [phone]                                REMEMBER:   Do not eat food AFTER MIDNIGHT              MAY HAVE CLEAR LIQUIDS UNTIL 9:15 AM     CLEAR LIQUID DIET   Foods Allowed                                                                     Foods Excluded  Coffee and tea, regular and decaf                             liquids that you cannot  Plain Jell-O in any flavor                                             see through such as: Fruit ices (not with fruit pulp)                                     milk, soups, orange juice  Iced Popsicles                                    All solid food Carbonated beverages, regular and diet                                    Cranberry, grape and apple juices Sports drinks like Gatorade Lightly seasoned clear broth or consume(fat free) Sugar, honey syrup                      Take these medicines the morning of surgery with  A SIPS OF WATER :     ALLOPURINOL  / NEXIUM / GEMFIBROZIL / LEVOTHYROXINE / TIZANIDINE / VESICARE / USE SYMBICORT / MAY USE FLONASE              TAKE ONLY 1/2 DOSE OF EVENING INSULIN AND NO INSULIN THE MORNING OF SURGERY     Do not wear jewelry, make-up   Do not wear lotions, powders, or perfumes.   Do not shave legs or underarms 12 hrs. before surgery (men may shave face)  Do not bring valuables to the hospital.  Contacts, dentures or bridgework may not be worn into surgery.  Leave suitcase in the car. After surgery it may be brought to your room.  For patients admitted to the  hospital more than one night, checkout time is            11:00 AM                                                         ________________________________________________________________________                                                                                                  [place]  Before surgery, you can play an important role.  Because skin is not sterile, your skin needs to be as free of germs as possible.  You can reduce the number of germs on your skin by washing with CHG (chlorahexidine gluconate) soap before surgery.  CHG is an antiseptic cleaner which kills germs and bonds with the skin to continue killing germs even after washing. Please DO NOT use if you have an allergy to CHG or antibacterial soaps.  If your skin becomes reddened/irritated stop using the CHG and inform your nurse when you arrive at Short Stay. Do not shave (including legs and underarms) for at least 48 hours prior to the first CHG shower.  You may shave your face. Please follow these instructions carefully:   1.  Shower with CHG Soap the night before surgery and the  morning of Surgery.   2.  If you choose to wash your hair, wash your hair first as usual with your  normal  Shampoo.   3.  After you shampoo, rinse your hair and body thoroughly to remove the  shampoo.                                         4.  Use CHG as you would any other liquid soap.  You can apply chg directly  to the skin and wash . Gently wash with scrungie or clean wascloth    5.  Apply the CHG Soap to your body ONLY FROM THE NECK DOWN.   Do not use on open  Wound or open sores. Avoid contact with eyes, ears mouth and genitals (private parts).                        Genitals (private parts) with your normal soap.              6.  Wash thoroughly, paying special attention to the area where your surgery  will be performed.   7.  Thoroughly rinse your body with warm  water from the neck down.   8.  DO NOT shower/wash with your normal soap after using and rinsing off  the CHG Soap .                9.  Pat yourself dry with a clean towel.             10.  Wear clean pajamas.             11.  Place clean sheets on your bed the night of your first shower and do not  sleep with pets.  Day of Surgery : Do not apply any lotions/deodorants the morning of surgery.  Please wear clean clothes to the hospital/surgery center.  FAILURE TO FOLLOW THESE INSTRUCTIONS MAY RESULT IN THE CANCELLATION OF YOUR SURGERY    PATIENT SIGNATURE_________________________________  ______________________________________________________________________     [name]  An incentive spirometer is a tool that can help keep your lungs clear and active. This tool measures how well you are filling your lungs with each breath. Taking long deep breaths may help reverse or decrease the chance of developing breathing (pulmonary) problems (especially infection) following:  A long period of time when you are unable to move or be active. BEFORE THE PROCEDURE   If the spirometer includes an indicator to show your best effort, your nurse or respiratory therapist will set it to a desired goal.  If possible, sit up straight or lean slightly forward. Try not to slouch.  Hold the incentive spirometer in an upright position. INSTRUCTIONS FOR USE  1. Sit on the edge of your bed if possible, or sit up as far as you can in bed or on a chair. 2. Hold the incentive spirometer in an upright position. 3. Breathe out normally. 4. Place the mouthpiece in your mouth and seal your lips tightly around it. 5. Breathe in slowly and as deeply as possible, raising the piston or the ball toward the top of the column. 6. Hold your breath for 3-5 seconds or for as long as possible. Allow the piston or ball to fall to the bottom of the column. 7. Remove the mouthpiece from your mouth and breathe  out normally. 8. Rest for a few seconds and repeat Steps 1 through 7 at least 10 times every 1-2 hours when you are awake. Take your time and take a few normal breaths between deep breaths. 9. The spirometer may include an indicator to show your best effort. Use the indicator as a goal to work toward during each repetition. 10. After each set of 10 deep breaths, practice coughing to be sure your lungs are clear. If you have an incision (the cut made at the time of surgery), support your incision when coughing by placing a pillow or rolled up towels firmly against it. Once you are able to get out of bed, walk around indoors and cough well. You may stop using the incentive spirometer when instructed by your caregiver.  RISKS AND COMPLICATIONS  Take your time so you do not get dizzy or light-headed.  If you are in pain, you may need to take or ask for pain medication before doing incentive spirometry. It is harder to take a deep breath if you are having pain. AFTER USE  Rest and breathe slowly and easily.  It can be helpful to keep track of a log of your progress. Your caregiver can provide you with a simple table to help with this. If you are using the spirometer at home, follow these instructions: [place] IF:   You are having difficultly using the spirometer.  You have trouble using the spirometer as often as instructed.  Your pain medication is not giving enough relief while using the spirometer.  You develop fever of 100.5 F (38.1 C) or higher. SEEK IMMEDIATE MEDICAL CARE IF:   You cough up bloody sputum that had not been present before.  You develop fever of 102 F (38.9 C) or greater.  You develop worsening pain at or near the incision site. MAKE SURE YOU:   Understand these instructions.  Will watch your condition.  Will get help right away if you are not doing well or get worse. Document Released: 02/04/2007 Document Revised: 12/17/2011 Document Reviewed:  04/07/2007 ExitCare Patient Information 2014 Exit[employer], [place].   ________________________________________________________________________  WHAT IS A BLOOD TRANSFUSION? Blood Transfusion Information  A transfusion is the replacement of blood or some of its parts. Blood is made up of multiple cells which provide different functions.  Red blood cells carry oxygen and are used for blood loss replacement.  White blood cells fight against infection.  Platelets control bleeding.  Plasma helps clot blood.  Other blood products are available for specialized needs, such as hemophilia or other clotting disorders. BEFORE THE TRANSFUSION  Who gives blood for transfusions?   Healthy volunteers who are fully evaluated to make sure their blood is safe. This is blood bank blood. Transfusion therapy is the safest it has ever been in the practice of medicine. Before blood is taken from a donor, a complete history is taken to make sure that person has no history of diseases nor engages in risky social behavior (examples are intravenous drug use or sexual activity with multiple partners). The donor's travel history is screened to minimize risk of transmitting infections, such as malaria. The donated blood is tested for signs of infectious diseases, such as HIV and hepatitis. The blood is then tested to be sure it is compatible with you in order to minimize the chance of a transfusion reaction. If you or a relative donates blood, this is often done in anticipation of surgery and is not appropriate for emergency situations. It takes many days to process the donated blood. RISKS AND COMPLICATIONS Although transfusion therapy is very safe and saves many lives, the main dangers of transfusion include:   Getting an infectious disease.  Developing a transfusion reaction. This is an allergic reaction to something in the blood you were given. Every precaution is taken to prevent this. The decision to have a blood  transfusion has been considered carefully by your caregiver before blood is given. Blood is not given unless the benefits outweigh the risks. AFTER THE TRANSFUSION  Right after receiving a blood transfusion, you will usually feel much better and more energetic. This is especially true if your red blood cells have gotten low (anemic). The transfusion raises the level of the red blood cells which carry oxygen, and this usually causes an energy  increase.  The nurse administering the transfusion will monitor you carefully for complications. HOME CARE INSTRUCTIONS  No special instructions are needed after a transfusion. You may find your energy is better. Speak with your caregiver about any limitations on activity for underlying diseases you may have. SEEK MEDICAL CARE IF:   Your condition is not improving after your transfusion.  You develop redness or irritation at the intravenous (IV) site. SEEK IMMEDIATE MEDICAL CARE IF:  Any of the following symptoms occur over the next 12 hours:  Shaking chills.  You have a temperature by mouth above 102 F (38.9 C), not controlled by medicine.  Chest, back, or muscle pain.  People around you feel you are not acting correctly or are confused.  Shortness of breath or difficulty breathing.  Dizziness and fainting.  You get a rash or develop hives.  You have a decrease in urine output.  Your urine turns a dark color or changes to pink, red, or brown. Any of the following symptoms occur over the next 10 days:  You have a temperature by mouth above 102 F (38.9 C), not controlled by medicine.  Shortness of breath.  Weakness after normal activity.  The white part of the eye turns yellow (jaundice).  You have a decrease in the amount of urine or are urinating less often.  Your urine turns a dark color or changes to pink, red, or brown. Document Released: 09/21/2000 Document Revised: 12/17/2011 Document Reviewed: 05/10/2008 [facility] Patient  Information 2014 [place], [place].  _______________________________________________________________________

## 2014-08-05 ENCOUNTER — Ambulatory Visit: Payer: Self-pay | Admitting: Orthopedic Surgery

## 2014-08-05 ENCOUNTER — Other Ambulatory Visit: Payer: Self-pay | Admitting: *Deleted

## 2014-08-05 MED ORDER — LOSARTAN POTASSIUM 100 MG PO TABS
100.0000 mg | ORAL_TABLET | ORAL | Status: DC
Start: 2014-08-05 — End: 2016-03-14

## 2014-08-05 NOTE — Progress Notes (Signed)
Abnormal CBC / CMET / UA faxed to Dr, Wynelle Link

## 2014-08-05 NOTE — Telephone Encounter (Signed)
Refilled electronically after adjusting to correct pharmacy

## 2014-08-05 NOTE — H&P (Signed)
[name] [name] DOB: Jan 18, 1951 Married / Language: [name] [name] / Race: Black or African American, White Female Date of Admission:  08-11-2014 Chief Complaint:  Loose Right Total Hip Arthroplasty History of Present Illness The patient is a 63 year old female who comes in  for a preoperative History and Physical. The patient is scheduled for a right actetabular revison versus total hip revision to be performed by Dr. Dione Plover. Aluisio, MD at [facility] on 08/11/2014. The patient is a 63 year old female who presents with a hip problem. The patient was seen for a second opinion.The patient reports right hip problems including pain symptoms that have been present for 5 year(s). The symptoms began without any known injury. Prior to being seen today the patient was previously evaluated by a colleague 5 year(s) ago. Symptoms present at the patient's previous evaluation included pain in the hip. Previous workup for this problem has included pelvic x-rays, hip x-rays, spinal MRI, hip MRI, bone scan and surgical consultation. She had the hip replaced 08/05/10. She had complications after that and had the liner revised from metal to poly on 08/02/12. She continues to have pain. She states that she did fairly well initially after the initial procedure in 2011. She rapidly started to have increasing pain along the lateral aspect of the hip and right groin. She also had a metallic taste in her mouth. She was revised from metal on metal to metal on polyethylene in 2013. That did well initially, but then she started to develop this increasing pain again. She is at her wit's end now. She is hurting at all times. Pain is throughout the lateral hip and right groin. It does radiate down to the anterior thigh. It is worse with weight bearing but also does occur at rest. She's not having any lower extremity weakness or paresthesias associated with this. She is very discouraged and upset. She had a bone scan and was  told that the bone scan did not show abnormality. Unfortunately, we don't have old x-rays to see if the cup has actually shifted. I feel it probably has shifted because she's got excessive polyethylene wear, as she's got excessive loads going in a superior position from the verticality of the cup. I have told her that there is no definitive loosening of the socket but she probably has loosening; regardless, she has excessive wear at an early state and either has a problem with the polyethylene itself or there are abnormal stresses on it. Unfortunately, the only thing that is going to potentially get her better is to revise this once again. Even if the cup is not loose, we may have to revise the cup to put it in a position where the poly will not be as stressed and will not wear as quickly. We discussed all this in detail, the procedure, risks and potential complications, rehab. course and discussed that there is a possibility that her pain might not get better with revision surgery. I was very clear about this, but she said she is at her wit's end and feels like she needs to do something. She is ready to proceed with surgery at this time. They have been treated conservatively in the past for the above stated problem and despite conservative measures, they continue to have progressive pain and severe functional limitations and dysfunction. They have failed non-operative management including home exercise, medications, and one revision procedure in 2013. It is felt that they would benefit from undergoing revison total joint replacement.  Risks and benefits of the procedure have been discussed with the patient and they elect to proceed with surgery. There are no active contraindications to surgery such as ongoing infection or rapidly progressive neurological disease.  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 First Degree Relatives reported Diabetes Mellitus Brother, Maternal Grandmother. Heart Disease Mother. Hypertension Brother, Maternal Grandmother, Mother, Sister. child Heart disease in female family member before age 49  Social History Children 1 Current drinker 06/04/2014: Currently drinks wine only occasionally per week Exercise Exercises rarely Current work status disabled Tobacco use Current every day smoker. 06/04/2014: smoke(d) less than 1/2 pack(s) per day Marital status married Living situation live alone No history of drug/alcohol rehab Number of flights of stairs before winded less than 1 Not under pain contract  Medication History Gemfibrozil (600MG  Tablet, Oral) Active. Hydrochlorothiazide (12.5MG  Tablet, Oral) Active. Flonase (50MCG/ACT Suspension, Nasal) Active. Allopurinol (100MG  Tablet, Oral) Active. ProAir HFA (108 (90 Base)MCG/ACT Aerosol Soln, Inhalation) Active. VESIcare (10MG  Tablet, Oral) Active. Furosemide (40MG  Tablet, Oral) Active. NovoLOG Mix 70/30 FlexPen ((70-30) 100UNIT/ML Susp Pen-inj, Subcutaneous) Active. Potassium Chloride Crys ER (20MEQ Tablet ER, Oral) Active. Levothyroxine Sodium (137MCG Tablet, Oral) Active. Ferrous Sulfate (325 (65 Fe)MG Tablet, Oral) Active. Crestor (10MG  Tablet, Oral) Active. Oxycodone-Acetaminophen (5-325MG  Tablet, Oral) Active. Symbicort (160-4.5MCG/ACT Aerosol, Inhalation) Active. Meloxicam (15MG  Tablet, Oral) Active. NexIUM (40MG  Capsule DR, Oral) Active. Clopidogrel  Bisulfate (75MG  Tablet, Oral) Active. (Plavix) Losartan Potassium (100MG  Tablet, Oral) Active. Vitamin D (400UNIT Tablet, Oral) Active. Lovaza (1GM Capsule, Oral) Active. (Omega 3)   Past Surgical History Thyroidectomy; Total Inguinal Hernia Repair open: left Total Hip Replacement right Tubal Ligation Colectomy partial Carpal Tunnel Repair bilateral Colon Polyp Removal - Colonoscopy Hysterectomy complete (non-cancerous) Gallbladder Surgery open   Review of Systems General Present- Night Sweats. Not Present- Chills, Fatigue, Fever, Memory Loss, Weight Gain and Weight Loss. Skin Not Present- Eczema, Hives, Itching, Lesions and Rash. HEENT Present- Blurred Vision. Not Present- Dentures, Double Vision, Headache, Hearing Loss, Tinnitus and Visual Loss. Respiratory Not Present- Allergies, Chronic Cough, Coughing up blood, Shortness of breath at rest and Shortness of breath with exertion. Cardiovascular Not Present- Chest Pain, Difficulty Breathing Lying Down, Murmur, Palpitations, Racing/skipping heartbeats and Swelling. Gastrointestinal Not Present- Abdominal Pain, Bloody Stool, Constipation, Diarrhea, Difficulty Swallowing, Heartburn, Jaundice, Loss of appetitie, Nausea and Vomiting. Female Genitourinary Present- Urinary frequency and Urinating at Night. Not Present- Blood in Urine, Discharge, Flank Pain, Incontinence, Painful Urination, Urgency, Urinary Retention and Weak urinary stream. Musculoskeletal Present- Joint Pain, Joint Swelling and Spasms. Not Present- Back Pain, Morning Stiffness, Muscle Pain and Muscle Weakness. Neurological Not Present- Blackout spells, Difficulty with balance, Dizziness, Paralysis, Tremor and Weakness. Psychiatric Not Present- Insomnia.  Vitals  Height: 64in Pulse: 76 (Regular)  BP: 168/86 (Sitting, Right Arm, Standard)   Physical Exam General Mental Status -Alert, cooperative and good historian. General Appearance-pleasant, Not  in acute distress. Orientation-Oriented X3. Build & Nutrition-Well nourished and Well developed.  Head and Neck Head-normocephalic, atraumatic . Neck Global Assessment - supple, no bruit auscultated on the right, no bruit auscultated on the left. Note: upper denture plate   Eye Pupil - Bilateral-Regular  and Round. Motion - Bilateral-EOMI.  Chest and Lung Exam Auscultation Breath sounds - clear at anterior chest wall and clear at posterior chest wall. Adventitious sounds - No Adventitious sounds.  Cardiovascular Auscultation Rhythm - Regular rate and rhythm. Heart Sounds - S1 WNL and S2 WNL. Murmurs & Other Heart Sounds - Auscultation of the heart reveals - No Murmurs.  Abdomen Palpation/Percussion Tenderness - Abdomen is non-tender to palpation. Rigidity (guarding) - Abdomen is soft. Auscultation Auscultation of the abdomen reveals - Bowel sounds normal.  Female Genitourinary Note: Not done, not pertinent to present illness   Musculoskeletal Note: On exam, she's A&O, in no apparent distress. Evaluation of her right hip shows flexion to 110, rotation in 20, out 30, abduction 30 with pain on ROM. She does have some tenderness of the greater trochanter. Palpation there does not reproduce her pain. Left hip shows pain-free ROM.  RADIOGRAPHS: AP pelvis and AP and lateral of the right hip show that she has a Corail stem in good position with no abnormalities. She has a Pinnacle cup which, to me, looks like it has probably shifted. It is abducted and anteverted and probably has shifted at some point. She has an excessive amount of polyethylene wear in this hip. She does have some osteolysis in the inferior pubic ramus.  Bone scan. For the most part it looks normal, but there is one window where she has intense uptake around the acetabular component.   Assessment & Plan Complications of internal orthopedic device right hip, implant, and graft  (T84.9XXA)  Note:Plan is for a Right Acetabular versus Total Hip Revision by Dr. Wynelle Link.  Plan is to go home following the procedure.  PCP - Dr. [name] Neurology - Dr. Krista Blue - Patient has been seen preoperatively and felt to be stable for surgery. Dr. Krista Blue detailed a letter stating that the patient could come off her Plavix for 5 days prior to surgery, but to be aware that she is at high risk for recurrent stroke with her mulitple vascualr risk factors.  Cardiology - Dr. Adora Fridge - Patient has been seen preoperatively and felt to be stable for surgery.  Topical TXA - history of CVA/TIA  Signed electronically by Joelene Millin, III PA-C

## 2014-08-10 MED ORDER — DEXTROSE 5 % IV SOLN
3.0000 g | INTRAVENOUS | Status: AC
  Administered 2014-08-11: 3 g via INTRAVENOUS
  Filled 2014-08-10: qty 3000

## 2014-08-11 ENCOUNTER — Inpatient Hospital Stay (HOSPITAL_COMMUNITY): Payer: Medicare HMO | Admitting: Anesthesiology

## 2014-08-11 ENCOUNTER — Encounter (HOSPITAL_COMMUNITY)
Admission: RE | Disposition: A | Discharge status: Home Health | Payer: Self-pay | Source: Ambulatory Visit | Attending: Orthopedic Surgery

## 2014-08-11 ENCOUNTER — Encounter (HOSPITAL_COMMUNITY): Payer: Self-pay | Admitting: *Deleted

## 2014-08-11 ENCOUNTER — Inpatient Hospital Stay (HOSPITAL_COMMUNITY)
Admission: RE | Admit: 2014-08-11 | Discharge: 2014-08-13 | DRG: 467 | Disposition: A | Discharge status: Home Health | Payer: Medicare HMO | Source: Ambulatory Visit | Attending: Orthopedic Surgery | Admitting: Orthopedic Surgery

## 2014-08-11 ENCOUNTER — Inpatient Hospital Stay (HOSPITAL_COMMUNITY): Payer: Medicare HMO

## 2014-08-11 DIAGNOSIS — I5022 Chronic systolic (congestive) heart failure: Secondary | ICD-10-CM | POA: Diagnosis present

## 2014-08-11 DIAGNOSIS — T84018A Broken internal joint prosthesis, other site, initial encounter: Secondary | ICD-10-CM

## 2014-08-11 DIAGNOSIS — N183 Chronic kidney disease, stage 3 (moderate): Secondary | ICD-10-CM | POA: Diagnosis present

## 2014-08-11 DIAGNOSIS — E039 Hypothyroidism, unspecified: Secondary | ICD-10-CM | POA: Diagnosis present

## 2014-08-11 DIAGNOSIS — I129 Hypertensive chronic kidney disease with stage 1 through stage 4 chronic kidney disease, or unspecified chronic kidney disease: Secondary | ICD-10-CM | POA: Diagnosis present

## 2014-08-11 DIAGNOSIS — E780 Pure hypercholesterolemia: Secondary | ICD-10-CM | POA: Diagnosis present

## 2014-08-11 DIAGNOSIS — Z6841 Body Mass Index (BMI) 40.0 and over, adult: Secondary | ICD-10-CM

## 2014-08-11 DIAGNOSIS — T8489XA Other specified complication of internal orthopedic prosthetic devices, implants and grafts, initial encounter: Secondary | ICD-10-CM | POA: Diagnosis present

## 2014-08-11 DIAGNOSIS — N184 Chronic kidney disease, stage 4 (severe): Secondary | ICD-10-CM | POA: Diagnosis present

## 2014-08-11 DIAGNOSIS — T84060D Wear of articular bearing surface of internal prosthetic right hip joint, subsequent encounter: Secondary | ICD-10-CM

## 2014-08-11 DIAGNOSIS — E6609 Other obesity due to excess calories: Secondary | ICD-10-CM | POA: Diagnosis present

## 2014-08-11 DIAGNOSIS — Z8673 Personal history of transient ischemic attack (TIA), and cerebral infarction without residual deficits: Secondary | ICD-10-CM | POA: Diagnosis not present

## 2014-08-11 DIAGNOSIS — Z8614 Personal history of Methicillin resistant Staphylococcus aureus infection: Secondary | ICD-10-CM

## 2014-08-11 DIAGNOSIS — T84018D Broken internal joint prosthesis, other site, subsequent encounter: Secondary | ICD-10-CM

## 2014-08-11 DIAGNOSIS — E119 Type 2 diabetes mellitus without complications: Secondary | ICD-10-CM | POA: Diagnosis present

## 2014-08-11 DIAGNOSIS — T84030A Mechanical loosening of internal right hip prosthetic joint, initial encounter: Secondary | ICD-10-CM | POA: Diagnosis present

## 2014-08-11 DIAGNOSIS — E869 Volume depletion, unspecified: Secondary | ICD-10-CM | POA: Diagnosis present

## 2014-08-11 DIAGNOSIS — G4733 Obstructive sleep apnea (adult) (pediatric): Secondary | ICD-10-CM | POA: Diagnosis present

## 2014-08-11 DIAGNOSIS — Z794 Long term (current) use of insulin: Secondary | ICD-10-CM

## 2014-08-11 DIAGNOSIS — T84090A Other mechanical complication of internal right hip prosthesis, initial encounter: Secondary | ICD-10-CM | POA: Diagnosis present

## 2014-08-11 DIAGNOSIS — E785 Hyperlipidemia, unspecified: Secondary | ICD-10-CM | POA: Diagnosis present

## 2014-08-11 DIAGNOSIS — F17210 Nicotine dependence, cigarettes, uncomplicated: Secondary | ICD-10-CM | POA: Diagnosis present

## 2014-08-11 DIAGNOSIS — I5023 Acute on chronic systolic (congestive) heart failure: Secondary | ICD-10-CM | POA: Diagnosis present

## 2014-08-11 DIAGNOSIS — M109 Gout, unspecified: Secondary | ICD-10-CM | POA: Diagnosis present

## 2014-08-11 DIAGNOSIS — J45909 Unspecified asthma, uncomplicated: Secondary | ICD-10-CM | POA: Diagnosis present

## 2014-08-11 DIAGNOSIS — J4489 Other specified chronic obstructive pulmonary disease: Secondary | ICD-10-CM | POA: Diagnosis present

## 2014-08-11 DIAGNOSIS — Z720 Tobacco use: Secondary | ICD-10-CM | POA: Diagnosis present

## 2014-08-11 DIAGNOSIS — J449 Chronic obstructive pulmonary disease, unspecified: Secondary | ICD-10-CM | POA: Diagnosis present

## 2014-08-11 DIAGNOSIS — K219 Gastro-esophageal reflux disease without esophagitis: Secondary | ICD-10-CM | POA: Diagnosis present

## 2014-08-11 DIAGNOSIS — N189 Chronic kidney disease, unspecified: Secondary | ICD-10-CM

## 2014-08-11 DIAGNOSIS — E1122 Type 2 diabetes mellitus with diabetic chronic kidney disease: Secondary | ICD-10-CM

## 2014-08-11 DIAGNOSIS — Z96649 Presence of unspecified artificial hip joint: Secondary | ICD-10-CM

## 2014-08-11 DIAGNOSIS — I10 Essential (primary) hypertension: Secondary | ICD-10-CM | POA: Diagnosis present

## 2014-08-11 DIAGNOSIS — R34 Anuria and oliguria: Secondary | ICD-10-CM | POA: Diagnosis present

## 2014-08-11 HISTORY — PX: TOTAL HIP REVISION: SHX763

## 2014-08-11 LAB — COMPREHENSIVE METABOLIC PANEL
ALT: 20 U/L (ref 0–35)
AST: 31 U/L (ref 0–37)
Albumin: 3.3 g/dL — ABNORMAL LOW (ref 3.5–5.2)
Alkaline Phosphatase: 90 U/L (ref 39–117)
Anion gap: 14 (ref 5–15)
BUN: 43 mg/dL — ABNORMAL HIGH (ref 6–23)
CO2: 25 mEq/L (ref 19–32)
Calcium: 9.5 mg/dL (ref 8.4–10.5)
Chloride: 100 mEq/L (ref 96–112)
Creatinine, Ser: 1.56 mg/dL — ABNORMAL HIGH (ref 0.50–1.10)
GFR calc Af Amer: 40 mL/min — ABNORMAL LOW (ref >90)
GFR calc non Af Amer: 34 mL/min — ABNORMAL LOW (ref >90)
Glucose, Bld: 180 mg/dL — ABNORMAL HIGH (ref 70–99)
Potassium: 4.1 mEq/L (ref 3.7–5.3)
Sodium: 139 mEq/L (ref 137–147)
Total Bilirubin: <0.2 mg/dL — ABNORMAL LOW (ref 0.3–1.2)
Total Protein: 7.7 g/dL (ref 6.0–8.3)

## 2014-08-11 LAB — TYPE AND SCREEN
ABO/RH(D): O POS
Antibody Screen: NEGATIVE

## 2014-08-11 LAB — CBC WITH DIFFERENTIAL/PLATELET
Basophils Absolute: 0 10*3/uL (ref 0.0–0.1)
Basophils Relative: 0 % (ref 0–1)
Eosinophils Absolute: 0 10*3/uL (ref 0.0–0.7)
Eosinophils Relative: 0 % (ref 0–5)
HCT: 36.6 % (ref 36.0–46.0)
Hemoglobin: 11.1 g/dL — ABNORMAL LOW (ref 12.0–15.0)
Lymphocytes Relative: 8 % — ABNORMAL LOW (ref 12–46)
Lymphs Abs: 1.6 10*3/uL (ref 0.7–4.0)
MCH: 26.4 pg (ref 26.0–34.0)
MCHC: 30.3 g/dL (ref 30.0–36.0)
MCV: 86.9 fL (ref 78.0–100.0)
Monocytes Absolute: 0.9 10*3/uL (ref 0.1–1.0)
Monocytes Relative: 5 % (ref 3–12)
Neutro Abs: 16.4 10*3/uL — ABNORMAL HIGH (ref 1.7–7.7)
Neutrophils Relative %: 87 % — ABNORMAL HIGH (ref 43–77)
Platelets: 307 10*3/uL (ref 150–400)
RBC: 4.21 MIL/uL (ref 3.87–5.11)
RDW: 15.7 % — ABNORMAL HIGH (ref 11.5–15.5)
WBC: 18.9 10*3/uL — ABNORMAL HIGH (ref 4.0–10.5)

## 2014-08-11 LAB — GLUCOSE, CAPILLARY
Glucose-Capillary: 138 mg/dL — ABNORMAL HIGH (ref 70–99)
Glucose-Capillary: 170 mg/dL — ABNORMAL HIGH (ref 70–99)
Glucose-Capillary: 158 mg/dL — ABNORMAL HIGH (ref 70–99)

## 2014-08-11 LAB — PRO B NATRIURETIC PEPTIDE: Pro B Natriuretic peptide (BNP): 449.3 pg/mL — ABNORMAL HIGH (ref 0–125)

## 2014-08-11 SURGERY — TOTAL HIP REVISION
Anesthesia: General | Site: Hip | Laterality: Right

## 2014-08-11 MED ORDER — HYDROMORPHONE HCL 1 MG/ML IJ SOLN
INTRAMUSCULAR | Status: AC
  Administered 2014-08-11: 0.5 mg via INTRAVENOUS
  Filled 2014-08-11: qty 1

## 2014-08-11 MED ORDER — ACETAMINOPHEN 10 MG/ML IV SOLN
1000.0000 mg | Freq: Once | INTRAVENOUS | Status: AC
  Administered 2014-08-11: 1000 mg via INTRAVENOUS
  Filled 2014-08-11: qty 100

## 2014-08-11 MED ORDER — TRANEXAMIC ACID 100 MG/ML IV SOLN
2000.0000 mg | Freq: Once | INTRAVENOUS | Status: DC
  Filled 2014-08-11: qty 20

## 2014-08-11 MED ORDER — FUROSEMIDE 40 MG PO TABS
40.0000 mg | ORAL_TABLET | Freq: Every day | ORAL | Status: DC
  Filled 2014-08-11: qty 1

## 2014-08-11 MED ORDER — FLUTICASONE PROPIONATE 50 MCG/ACT NA SUSP
2.0000 | NASAL | Status: DC | PRN
  Filled 2014-08-11: qty 16

## 2014-08-11 MED ORDER — ACETAMINOPHEN 500 MG PO TABS
1000.0000 mg | ORAL_TABLET | Freq: Four times a day (QID) | ORAL | Status: AC
  Administered 2014-08-11 – 2014-08-12 (×4): 1000 mg via ORAL
  Filled 2014-08-11 (×6): qty 2

## 2014-08-11 MED ORDER — INSULIN ASPART PROT & ASPART (70-30 MIX) 100 UNIT/ML ~~LOC~~ SUSP
40.0000 [IU] | Freq: Every day | SUBCUTANEOUS | Status: DC
  Administered 2014-08-12 – 2014-08-13 (×2): 40 [IU] via SUBCUTANEOUS
  Filled 2014-08-11: qty 10

## 2014-08-11 MED ORDER — LACTATED RINGERS IV SOLN
INTRAVENOUS | Status: DC | PRN
  Administered 2014-08-11 (×2): via INTRAVENOUS

## 2014-08-11 MED ORDER — ALLOPURINOL 100 MG PO TABS
100.0000 mg | ORAL_TABLET | Freq: Every day | ORAL | Status: DC
  Administered 2014-08-12 – 2014-08-13 (×2): 100 mg via ORAL
  Filled 2014-08-11 (×2): qty 1

## 2014-08-11 MED ORDER — ESMOLOL HCL 10 MG/ML IV SOLN
INTRAVENOUS | Status: DC | PRN
  Administered 2014-08-11: 30 mg via INTRAVENOUS

## 2014-08-11 MED ORDER — BUPIVACAINE HCL (PF) 0.25 % IJ SOLN
INTRAMUSCULAR | Status: AC
  Filled 2014-08-11: qty 30

## 2014-08-11 MED ORDER — TRAMADOL HCL 50 MG PO TABS: 50.0000 mg | ORAL_TABLET | Freq: Four times a day (QID) | ORAL | Status: DC | PRN

## 2014-08-11 MED ORDER — LACTATED RINGERS IV SOLN
INTRAVENOUS | Status: DC
  Administered 2014-08-11: 1000 mL via INTRAVENOUS

## 2014-08-11 MED ORDER — TRANEXAMIC ACID 100 MG/ML IV SOLN
2000.0000 mg | INTRAVENOUS | Status: DC | PRN
  Administered 2014-08-11: 2000 mg via TOPICAL

## 2014-08-11 MED ORDER — LIDOCAINE HCL (CARDIAC) 20 MG/ML IV SOLN
INTRAVENOUS | Status: DC | PRN
  Administered 2014-08-11: 60 mg via INTRAVENOUS

## 2014-08-11 MED ORDER — SODIUM CHLORIDE 0.9 % IJ SOLN
INTRAMUSCULAR | Status: DC | PRN
  Administered 2014-08-11: 30 mL

## 2014-08-11 MED ORDER — GLYCOPYRROLATE 0.2 MG/ML IJ SOLN
INTRAMUSCULAR | Status: AC
  Filled 2014-08-11: qty 3

## 2014-08-11 MED ORDER — OXYCODONE HCL 5 MG PO TABS
5.0000 mg | ORAL_TABLET | ORAL | Status: DC | PRN
  Administered 2014-08-12 – 2014-08-13 (×11): 20 mg via ORAL
  Filled 2014-08-11 (×11): qty 4

## 2014-08-11 MED ORDER — POTASSIUM CHLORIDE CRYS ER 20 MEQ PO TBCR
20.0000 meq | EXTENDED_RELEASE_TABLET | Freq: Every day | ORAL | Status: DC
  Administered 2014-08-11 – 2014-08-12 (×2): 20 meq via ORAL
  Filled 2014-08-11 (×3): qty 1

## 2014-08-11 MED ORDER — ALBUTEROL SULFATE (2.5 MG/3ML) 0.083% IN NEBU: 2.5000 mg | INHALATION_SOLUTION | RESPIRATORY_TRACT | Status: DC | PRN

## 2014-08-11 MED ORDER — ROCURONIUM BROMIDE 100 MG/10ML IV SOLN
INTRAVENOUS | Status: DC | PRN
  Administered 2014-08-11: 40 mg via INTRAVENOUS
  Administered 2014-08-11: 10 mg via INTRAVENOUS

## 2014-08-11 MED ORDER — ROCURONIUM BROMIDE 100 MG/10ML IV SOLN
INTRAVENOUS | Status: AC
  Filled 2014-08-11: qty 1

## 2014-08-11 MED ORDER — METOCLOPRAMIDE HCL 10 MG PO TABS: 5.0000 mg | ORAL_TABLET | Freq: Three times a day (TID) | ORAL | Status: DC | PRN

## 2014-08-11 MED ORDER — PROPOFOL 10 MG/ML IV BOLUS
INTRAVENOUS | Status: AC
  Filled 2014-08-11: qty 20

## 2014-08-11 MED ORDER — METOCLOPRAMIDE HCL 5 MG/ML IJ SOLN: 5.0000 mg | Freq: Three times a day (TID) | INTRAMUSCULAR | Status: DC | PRN

## 2014-08-11 MED ORDER — ONDANSETRON HCL 4 MG/2ML IJ SOLN
INTRAMUSCULAR | Status: DC | PRN
  Administered 2014-08-11: 4 mg via INTRAVENOUS

## 2014-08-11 MED ORDER — BUDESONIDE-FORMOTEROL FUMARATE 160-4.5 MCG/ACT IN AERO
2.0000 | INHALATION_SPRAY | Freq: Two times a day (BID) | RESPIRATORY_TRACT | Status: DC
  Administered 2014-08-11 – 2014-08-13 (×4): 2 via RESPIRATORY_TRACT
  Filled 2014-08-11: qty 6

## 2014-08-11 MED ORDER — LACTATED RINGERS IV BOLUS (SEPSIS): 500.0000 mL | Freq: Once | INTRAVENOUS | Status: DC

## 2014-08-11 MED ORDER — HYDROMORPHONE HCL 2 MG/ML IJ SOLN
INTRAMUSCULAR | Status: AC
  Filled 2014-08-11: qty 1

## 2014-08-11 MED ORDER — HYDROMORPHONE HCL 1 MG/ML IJ SOLN
0.5000 mg | INTRAMUSCULAR | Status: DC | PRN
  Administered 2014-08-11 – 2014-08-12 (×3): 1 mg via INTRAVENOUS
  Filled 2014-08-11 (×3): qty 1

## 2014-08-11 MED ORDER — MIDAZOLAM HCL 5 MG/5ML IJ SOLN
INTRAMUSCULAR | Status: DC | PRN
  Administered 2014-08-11: 2 mg via INTRAVENOUS

## 2014-08-11 MED ORDER — ONDANSETRON HCL 4 MG/2ML IJ SOLN: 4.0000 mg | Freq: Four times a day (QID) | INTRAMUSCULAR | Status: DC | PRN

## 2014-08-11 MED ORDER — LACTATED RINGERS IV BOLUS (SEPSIS): 250.0000 mL | Freq: Once | INTRAVENOUS | Status: DC

## 2014-08-11 MED ORDER — CHLORHEXIDINE GLUCONATE 4 % EX LIQD: 60.0000 mL | Freq: Once | CUTANEOUS | Status: DC

## 2014-08-11 MED ORDER — 0.9 % SODIUM CHLORIDE (POUR BTL) OPTIME
TOPICAL | Status: DC | PRN
  Administered 2014-08-11: 1000 mL

## 2014-08-11 MED ORDER — HYDROMORPHONE HCL 1 MG/ML IJ SOLN
0.2500 mg | INTRAMUSCULAR | Status: DC | PRN
  Administered 2014-08-11 (×3): 0.5 mg via INTRAVENOUS

## 2014-08-11 MED ORDER — PHENOL 1.4 % MT LIQD: 1.0000 | OROMUCOSAL | Status: DC | PRN

## 2014-08-11 MED ORDER — NEOSTIGMINE METHYLSULFATE 10 MG/10ML IV SOLN
INTRAVENOUS | Status: AC
  Filled 2014-08-11: qty 1

## 2014-08-11 MED ORDER — BUPIVACAINE LIPOSOME 1.3 % IJ SUSP
INTRAMUSCULAR | Status: DC | PRN
  Administered 2014-08-11: 20 mL

## 2014-08-11 MED ORDER — ONDANSETRON HCL 4 MG/2ML IJ SOLN
INTRAMUSCULAR | Status: AC
  Filled 2014-08-11: qty 2

## 2014-08-11 MED ORDER — BUPIVACAINE HCL 0.25 % IJ SOLN
INTRAMUSCULAR | Status: DC | PRN
  Administered 2014-08-11: 20 mL

## 2014-08-11 MED ORDER — BISACODYL 10 MG RE SUPP: 10.0000 mg | Freq: Every day | RECTAL | Status: DC | PRN

## 2014-08-11 MED ORDER — STERILE WATER FOR IRRIGATION IR SOLN
Status: DC | PRN
  Administered 2014-08-11: 1500 mL

## 2014-08-11 MED ORDER — PROPOFOL 10 MG/ML IV BOLUS
INTRAVENOUS | Status: DC | PRN
  Administered 2014-08-11: 300 mg via INTRAVENOUS

## 2014-08-11 MED ORDER — ESMOLOL HCL 10 MG/ML IV SOLN
INTRAVENOUS | Status: AC
  Filled 2014-08-11: qty 10

## 2014-08-11 MED ORDER — TIZANIDINE HCL 4 MG PO TABS
4.0000 mg | ORAL_TABLET | Freq: Two times a day (BID) | ORAL | Status: DC
  Administered 2014-08-11 – 2014-08-13 (×4): 4 mg via ORAL
  Filled 2014-08-11 (×5): qty 1

## 2014-08-11 MED ORDER — RIVAROXABAN 10 MG PO TABS
10.0000 mg | ORAL_TABLET | Freq: Every day | ORAL | Status: DC
Start: 2014-08-12 — End: 2014-08-13
  Administered 2014-08-12 – 2014-08-13 (×2): 10 mg via ORAL
  Filled 2014-08-11 (×3): qty 1

## 2014-08-11 MED ORDER — MIDAZOLAM HCL 2 MG/2ML IJ SOLN
INTRAMUSCULAR | Status: AC
  Filled 2014-08-11: qty 2

## 2014-08-11 MED ORDER — BUPIVACAINE LIPOSOME 1.3 % IJ SUSP
20.0000 mL | Freq: Once | INTRAMUSCULAR | Status: DC
  Filled 2014-08-11: qty 20

## 2014-08-11 MED ORDER — ACETAMINOPHEN 650 MG RE SUPP: 650.0000 mg | Freq: Four times a day (QID) | RECTAL | Status: DC | PRN

## 2014-08-11 MED ORDER — SODIUM CHLORIDE 0.9 % IJ SOLN
INTRAMUSCULAR | Status: AC
Start: 2014-08-11 — End: 2014-08-11
  Filled 2014-08-11: qty 50

## 2014-08-11 MED ORDER — FENTANYL CITRATE 0.05 MG/ML IJ SOLN
INTRAMUSCULAR | Status: DC | PRN
  Administered 2014-08-11: 50 ug via INTRAVENOUS
  Administered 2014-08-11 (×2): 75 ug via INTRAVENOUS
  Administered 2014-08-11: 50 ug via INTRAVENOUS

## 2014-08-11 MED ORDER — INSULIN ASPART 100 UNIT/ML ~~LOC~~ SOLN
0.0000 [IU] | SUBCUTANEOUS | Status: DC
  Administered 2014-08-11 – 2014-08-12 (×2): 2 [IU] via SUBCUTANEOUS

## 2014-08-11 MED ORDER — DEXAMETHASONE SODIUM PHOSPHATE 10 MG/ML IJ SOLN
10.0000 mg | Freq: Once | INTRAMUSCULAR | Status: AC
  Administered 2014-08-12: 10 mg via INTRAVENOUS
  Filled 2014-08-11: qty 1

## 2014-08-11 MED ORDER — POLYETHYLENE GLYCOL 3350 17 G PO PACK: 17.0000 g | PACK | Freq: Every day | ORAL | Status: DC | PRN

## 2014-08-11 MED ORDER — METHOCARBAMOL 500 MG PO TABS
500.0000 mg | ORAL_TABLET | Freq: Four times a day (QID) | ORAL | Status: DC | PRN
  Administered 2014-08-12 – 2014-08-13 (×4): 500 mg via ORAL
  Filled 2014-08-11 (×4): qty 1

## 2014-08-11 MED ORDER — FENTANYL CITRATE 0.05 MG/ML IJ SOLN
INTRAMUSCULAR | Status: AC
  Filled 2014-08-11: qty 5

## 2014-08-11 MED ORDER — MENTHOL 3 MG MT LOZG: 1.0000 | LOZENGE | OROMUCOSAL | Status: DC | PRN

## 2014-08-11 MED ORDER — INSULIN ASPART PROT & ASPART (70-30 MIX) 100 UNIT/ML ~~LOC~~ SUSP
30.0000 [IU] | Freq: Every day | SUBCUTANEOUS | Status: DC
  Administered 2014-08-12 – 2014-08-13 (×2): 30 [IU] via SUBCUTANEOUS

## 2014-08-11 MED ORDER — DARIFENACIN HYDROBROMIDE ER 15 MG PO TB24
15.0000 mg | ORAL_TABLET | Freq: Every day | ORAL | Status: DC
  Administered 2014-08-12 – 2014-08-13 (×2): 15 mg via ORAL
  Filled 2014-08-11 (×2): qty 1

## 2014-08-11 MED ORDER — FLEET ENEMA 7-19 GM/118ML RE ENEM: 1.0000 | ENEMA | Freq: Once | RECTAL | Status: AC | PRN

## 2014-08-11 MED ORDER — NEOSTIGMINE METHYLSULFATE 10 MG/10ML IV SOLN
INTRAVENOUS | Status: DC | PRN
  Administered 2014-08-11: 5 mg via INTRAVENOUS

## 2014-08-11 MED ORDER — PANTOPRAZOLE SODIUM 40 MG PO TBEC
80.0000 mg | DELAYED_RELEASE_TABLET | Freq: Every day | ORAL | Status: DC
  Filled 2014-08-11: qty 2

## 2014-08-11 MED ORDER — ACETAMINOPHEN 325 MG PO TABS: 650.0000 mg | ORAL_TABLET | Freq: Four times a day (QID) | ORAL | Status: DC | PRN

## 2014-08-11 MED ORDER — LIDOCAINE HCL (CARDIAC) 20 MG/ML IV SOLN
INTRAVENOUS | Status: AC
  Filled 2014-08-11: qty 5

## 2014-08-11 MED ORDER — LABETALOL HCL 5 MG/ML IV SOLN
INTRAVENOUS | Status: AC
Start: 2014-08-11 — End: 2014-08-11
  Administered 2014-08-11: 5 mg via INTRAVENOUS
  Filled 2014-08-11: qty 4

## 2014-08-11 MED ORDER — PROMETHAZINE HCL 25 MG/ML IJ SOLN: 6.2500 mg | INTRAMUSCULAR | Status: DC | PRN

## 2014-08-11 MED ORDER — GLYCOPYRROLATE 0.2 MG/ML IJ SOLN
INTRAMUSCULAR | Status: DC | PRN
  Administered 2014-08-11: .8 mg via INTRAVENOUS

## 2014-08-11 MED ORDER — GLYCOPYRROLATE 0.2 MG/ML IJ SOLN
INTRAMUSCULAR | Status: AC
  Filled 2014-08-11: qty 1

## 2014-08-11 MED ORDER — LOSARTAN POTASSIUM 50 MG PO TABS
100.0000 mg | ORAL_TABLET | Freq: Every day | ORAL | Status: DC
  Administered 2014-08-12 – 2014-08-13 (×2): 100 mg via ORAL
  Filled 2014-08-11 (×2): qty 2

## 2014-08-11 MED ORDER — HYDROMORPHONE HCL 1 MG/ML IJ SOLN
INTRAMUSCULAR | Status: DC | PRN
  Administered 2014-08-11: 0.5 mg via INTRAVENOUS
  Administered 2014-08-11: 1 mg via INTRAVENOUS
  Administered 2014-08-11: 0.5 mg via INTRAVENOUS

## 2014-08-11 MED ORDER — POLYVINYL ALCOHOL 1.4 % OP SOLN
2.0000 [drp] | Freq: Every day | OPHTHALMIC | Status: DC | PRN
  Filled 2014-08-11: qty 15

## 2014-08-11 MED ORDER — LABETALOL HCL 5 MG/ML IV SOLN
INTRAVENOUS | Status: DC | PRN
  Administered 2014-08-11: 5 mg via INTRAVENOUS

## 2014-08-11 MED ORDER — LOSARTAN POTASSIUM 50 MG PO TABS: 100.0000 mg | ORAL_TABLET | ORAL | Status: DC

## 2014-08-11 MED ORDER — LEVOTHYROXINE SODIUM 137 MCG PO TABS
137.0000 ug | ORAL_TABLET | Freq: Every day | ORAL | Status: DC
  Administered 2014-08-12 – 2014-08-13 (×2): 137 ug via ORAL
  Filled 2014-08-11 (×3): qty 1

## 2014-08-11 MED ORDER — SUCCINYLCHOLINE CHLORIDE 20 MG/ML IJ SOLN
INTRAMUSCULAR | Status: DC | PRN
  Administered 2014-08-11: 100 mg via INTRAVENOUS

## 2014-08-11 MED ORDER — DIPHENHYDRAMINE HCL 12.5 MG/5ML PO ELIX
12.5000 mg | ORAL_SOLUTION | ORAL | Status: DC | PRN
  Administered 2014-08-12 (×2): 25 mg via ORAL
  Filled 2014-08-11 (×2): qty 10

## 2014-08-11 MED ORDER — SODIUM CHLORIDE 0.9 % IV SOLN: INTRAVENOUS | Status: DC

## 2014-08-11 MED ORDER — INSULIN ASPART 100 UNIT/ML ~~LOC~~ SOLN
0.0000 [IU] | Freq: Three times a day (TID) | SUBCUTANEOUS | Status: DC
  Administered 2014-08-12: 3 [IU] via SUBCUTANEOUS
  Administered 2014-08-12: 5 [IU] via SUBCUTANEOUS
  Administered 2014-08-12: 2 [IU] via SUBCUTANEOUS
  Administered 2014-08-13: 5 [IU] via SUBCUTANEOUS

## 2014-08-11 MED ORDER — HYDROCHLOROTHIAZIDE 12.5 MG PO CAPS
12.5000 mg | ORAL_CAPSULE | Freq: Every day | ORAL | Status: DC
  Administered 2014-08-12 – 2014-08-13 (×2): 12.5 mg via ORAL
  Filled 2014-08-11 (×2): qty 1

## 2014-08-11 MED ORDER — METHOCARBAMOL 1000 MG/10ML IJ SOLN
500.0000 mg | Freq: Four times a day (QID) | INTRAVENOUS | Status: DC | PRN
  Administered 2014-08-11: 500 mg via INTRAVENOUS
  Filled 2014-08-11 (×2): qty 5

## 2014-08-11 MED ORDER — ONDANSETRON HCL 4 MG PO TABS: 4.0000 mg | ORAL_TABLET | Freq: Four times a day (QID) | ORAL | Status: DC | PRN

## 2014-08-11 MED ORDER — CEFAZOLIN SODIUM-DEXTROSE 2-3 GM-% IV SOLR
2.0000 g | Freq: Four times a day (QID) | INTRAVENOUS | Status: AC
  Administered 2014-08-11 – 2014-08-12 (×2): 2 g via INTRAVENOUS
  Filled 2014-08-11 (×3): qty 50

## 2014-08-11 MED ORDER — DEXAMETHASONE SODIUM PHOSPHATE 10 MG/ML IJ SOLN: 10.0000 mg | Freq: Once | INTRAMUSCULAR | Status: DC

## 2014-08-11 MED ORDER — GEMFIBROZIL 600 MG PO TABS
600.0000 mg | ORAL_TABLET | Freq: Two times a day (BID) | ORAL | Status: DC
  Administered 2014-08-12 – 2014-08-13 (×4): 600 mg via ORAL
  Filled 2014-08-11 (×5): qty 1

## 2014-08-11 MED ORDER — INSULIN ASPART PROT & ASPART (70-30 MIX) 100 UNIT/ML ~~LOC~~ SUSP: 30.0000 [IU] | Freq: Two times a day (BID) | SUBCUTANEOUS | Status: DC

## 2014-08-11 MED ORDER — LABETALOL HCL 5 MG/ML IV SOLN
5.0000 mg | INTRAVENOUS | Status: DC | PRN
  Administered 2014-08-11 (×2): 5 mg via INTRAVENOUS

## 2014-08-11 MED ORDER — DOCUSATE SODIUM 100 MG PO CAPS
100.0000 mg | ORAL_CAPSULE | Freq: Two times a day (BID) | ORAL | Status: DC
  Administered 2014-08-11 – 2014-08-13 (×4): 100 mg via ORAL

## 2014-08-11 MED ORDER — SODIUM CHLORIDE 0.9 % IV SOLN
INTRAVENOUS | Status: DC
  Administered 2014-08-11: 1000 mL via INTRAVENOUS

## 2014-08-11 SURGICAL SUPPLY — 65 items
BAG ZIPLOCK 12X15 (MISCELLANEOUS) ×6 IMPLANT
BIT DRILL 2.8X128 (BIT) ×2 IMPLANT
BLADE EXTENDED COATED 6.5IN (ELECTRODE) ×2 IMPLANT
BLADE SAW SAG 73X25 THK (BLADE) ×1
BLADE SAW SGTL 73X25 THK (BLADE) ×1 IMPLANT
BRUSH FEMORAL CANAL (MISCELLANEOUS) IMPLANT
CUP PINNACLE SZ 56MM (Hips) ×2 IMPLANT
DRAPE INCISE IOBAN 66X45 STRL (DRAPES) ×2 IMPLANT
DRAPE POUCH INSTRU U-SHP 10X18 (DRAPES) ×2 IMPLANT
DRAPE SPLIT 77X100IN (DRAPES) ×4 IMPLANT
DRAPE U-SHAPE 47X51 STRL (DRAPES) ×2 IMPLANT
DRSG ADAPTIC 3X8 NADH LF (GAUZE/BANDAGES/DRESSINGS) ×2 IMPLANT
DRSG EMULSION OIL 3X16 NADH (GAUZE/BANDAGES/DRESSINGS) ×2 IMPLANT
DRSG MEPILEX BORDER 4X4 (GAUZE/BANDAGES/DRESSINGS) ×2 IMPLANT
DRSG MEPILEX BORDER 4X8 (GAUZE/BANDAGES/DRESSINGS) ×4 IMPLANT
DURAPREP 26ML APPLICATOR (WOUND CARE) ×4 IMPLANT
ELECT REM PT RETURN 9FT ADLT (ELECTROSURGICAL) ×2
ELECTRODE REM PT RTRN 9FT ADLT (ELECTROSURGICAL) ×1 IMPLANT
EVACUATOR 1/8 PVC DRAIN (DRAIN) ×2 IMPLANT
FACESHIELD WRAPAROUND (MASK) ×8 IMPLANT
GAUZE SPONGE 4X4 12PLY STRL (GAUZE/BANDAGES/DRESSINGS) IMPLANT
GLOVE BIO SURGEON STRL SZ7.5 (GLOVE) ×2 IMPLANT
GLOVE BIO SURGEON STRL SZ8 (GLOVE) ×2 IMPLANT
GLOVE BIOGEL PI IND STRL 8 (GLOVE) ×3 IMPLANT
GLOVE BIOGEL PI INDICATOR 8 (GLOVE) ×3
GLOVE SURG SS PI 6.5 STRL IVOR (GLOVE) ×4 IMPLANT
GOWN STRL REUS W/TWL LRG LVL3 (GOWN DISPOSABLE) ×4 IMPLANT
GOWN STRL REUS W/TWL XL LVL3 (GOWN DISPOSABLE) ×2 IMPLANT
HANDPIECE INTERPULSE COAX TIP (DISPOSABLE)
HEAD FEM STD 32X+13 STRL (Hips) ×2 IMPLANT
IMMOBILIZER KNEE 20 (SOFTGOODS) ×2
IMMOBILIZER KNEE 20 THIGH 36 (SOFTGOODS) ×1 IMPLANT
KIT BASIN OR (CUSTOM PROCEDURE TRAY) ×2 IMPLANT
LINER MARATHON 32 50 (Hips) ×2 IMPLANT
MANIFOLD NEPTUNE II (INSTRUMENTS) ×2 IMPLANT
NDL SAFETY ECLIPSE 18X1.5 (NEEDLE) ×2 IMPLANT
NEEDLE HYPO 18GX1.5 SHARP (NEEDLE) ×2
NS IRRIG 1000ML POUR BTL (IV SOLUTION) ×2 IMPLANT
PACK TOTAL JOINT (CUSTOM PROCEDURE TRAY) ×2 IMPLANT
PADDING CAST COTTON 6X4 STRL (CAST SUPPLIES) IMPLANT
PASSER SUT SWANSON 36MM LOOP (INSTRUMENTS) ×2 IMPLANT
POSITIONER SURGICAL ARM (MISCELLANEOUS) ×2 IMPLANT
PRESSURIZER FEMORAL UNIV (MISCELLANEOUS) IMPLANT
SCREW 6.5MMX30MM (Screw) ×2 IMPLANT
SCREW 6.5MMX35MM (Screw) ×2 IMPLANT
SCREW TPR HD 5.0MM DIA 50 (Screw) ×4 IMPLANT
SET HNDPC FAN SPRY TIP SCT (DISPOSABLE) IMPLANT
SPONGE LAP 18X18 X RAY DECT (DISPOSABLE) ×2 IMPLANT
STAPLER VISISTAT 35W (STAPLE) IMPLANT
STRIP CLOSURE SKIN 1/2X4 (GAUZE/BANDAGES/DRESSINGS) ×4 IMPLANT
SUCTION FRAZIER TIP 10 FR DISP (SUCTIONS) ×2 IMPLANT
SUT ETHIBOND NAB CT1 #1 30IN (SUTURE) ×4 IMPLANT
SUT MNCRL AB 4-0 PS2 18 (SUTURE) ×2 IMPLANT
SUT VIC AB 1 CT1 27 (SUTURE) ×3
SUT VIC AB 1 CT1 27XBRD ANTBC (SUTURE) ×3 IMPLANT
SUT VIC AB 2-0 CT1 27 (SUTURE) ×3
SUT VIC AB 2-0 CT1 TAPERPNT 27 (SUTURE) ×3 IMPLANT
SUT VLOC 180 0 24IN GS25 (SUTURE) ×4 IMPLANT
SWAB COLLECTION DEVICE MRSA (MISCELLANEOUS) IMPLANT
SYR 50ML LL SCALE MARK (SYRINGE) ×4 IMPLANT
TOWEL OR 17X26 10 PK STRL BLUE (TOWEL DISPOSABLE) ×4 IMPLANT
TOWER CARTRIDGE SMART MIX (DISPOSABLE) IMPLANT
TRAY FOLEY CATH 14FRSI W/METER (CATHETERS) ×2 IMPLANT
TUBE ANAEROBIC SPECIMEN COL (MISCELLANEOUS) IMPLANT
WATER STERILE IRR 1500ML POUR (IV SOLUTION) ×2 IMPLANT

## 2014-08-11 NOTE — Transfer of Care (Signed)
Immediate Anesthesia Transfer of Care Note  Patient: [name] [name]  Procedure(s) Performed: Procedure(s): RIGHT ACETABULAR REVISION (Right)  Patient Location: PACU  Anesthesia Type:General  Level of Consciousness: awake, alert , oriented and patient cooperative  Airway & Oxygen Therapy: Patient Spontanous Breathing and Patient connected to face mask oxygen  Post-op Assessment: Report given to PACU RN and Post -op Vital signs reviewed and stable  Post vital signs: Reviewed and stable  Complications: No apparent anesthesia complications

## 2014-08-11 NOTE — Op Note (Signed)
NAMEVERNE, COVE             ACCOUNT NO.:  [id number]  MEDICAL RECORD NO.:  [id number]  LOCATION:  80                         FACILITY:  [facility]  PHYSICIAN:  Gaynelle Arabian, M.D.    DATE OF BIRTH:  May 25, 1951  DATE OF PROCEDURE:  08/11/2014 DATE OF DISCHARGE:                              OPERATIVE REPORT   PREOPERATIVE DIAGNOSIS:  Failed right total hip arthroplasty.  POSTOPERATIVE DIAGNOSIS:  Failed right total hip arthroplasty.  PROCEDURE:  Right total hip arthroplasty revision.  SURGEON:  Gaynelle Arabian, M.D.  ASSISTANT:  Alexzandrew L. Dara Lords, PA-C  ANESTHESIA:  General.  ESTIMATED BLOOD LOSS:  250.  DRAINS:  Hemovac x1.  COMPLICATIONS:  None.  CONDITION:  Stable to recovery.  BRIEF CLINICAL NOTE:  [name] [name] is a 63 year old female with complex history in regards to her right hip.  She had a right total hip arthroplasty done in the outside facility in 2011, with metal-on-metal. She had a reaction to the metal, eventually had a conversion to a polyethylene liner.  She has had persistent pain since.  I saw her in second opinion few months ago and noted that she had severe wear of the polyethylene and it appeared that her acetabular component was excessively anteverted and abducted.  It was felt that it was either a loose cup or just impingement leading to poly wear.  She had significant pain and dysfunction and presents for acetabular versus total hip revision.  PROCEDURE IN DETAIL:  After successful administration of general anesthetic, the patient was placed in the left lateral decubitus position with the right side up and held with the hip positioner.  Right lower extremity was isolated from her perineum with plastic drapes and prepped and draped in the usual sterile fashion.  Standard posterolateral incision was made with a 10 blade through subcutaneous tissue to the fascia lata which was incised in line with the skin incision.  Sciatic nerve was palpated  and protected and posterior pseudocapsule was isolated off the femur and removed.  The hip was then dislocated.  The femoral head was removed off the femoral neck.  The femoral stem was well fixed and was in good position.  The femur was then translocated anteriorly to gain acetabular exposure.  Acetabular retractors are placed to gain circumferential exposure.  Soft tissue was removed around the rim of the acetabulum.  The component was excessively anteverted and was abducted about 90 degrees.  There was severe wear of the polyethylene posteriorly consistent with impingement of the femoral head or neck against the posterior lip of the acetabulum with complete wear through the polyethylene.  I removed the polyethylene from the acetabular shell with the extraction device.  This was a pinnacle acetabular shell.  We then utilized a Moreland revision acetabular instruments to disrupt the interface between the femoral component and bone and removed the acetabular component.  It was stable and fixed, but easily removed as there were only a few points of fixation between the bone and the shell.  Fortunately, there was no significant bony defect. She did have some defect posterosuperior, but the posterior column and anterior column were intact.  I then reamed starting at 47 mm up to 55  mm in about 40 degrees of abduction, 20 degrees of anteversion.  I impacted a 56 mm pinnacle revision acetabular shell with Gription.  We actually had excellent purchase with the shell and then placed 2 additional dome screws and 2 peripheral screws with good purchase.  The shell was placed in slightly less abduction than usual in order to get better bony coverage.  The 32 mm neutral +4 marathon liner was then placed into the acetabular shell.  We trialed the femoral head, so we need along this possible femoral head which was a 32+ 13.  I placed in a 32+ 13 metal head and reduced the hip with excellent stability  with full extension, full external rotation, 70 degrees flexion, 40 degrees adduction, 90 degrees internal rotation, 90 degrees of flexion and 70 degrees of internal rotation.  The trials were removed and the permanent 32+ 13 head was placed in the same stability parameters.  Wound was copiously irrigated with saline solution and posterior soft tissues reattached to the femur through drill holes with Ethibond suture.  A 20 mL of Exparel mixed with 40 mL of saline that injected into the fascia lata and gluteal muscles, and subcu tissue.  Additional 20 mL of 0.25% Marcaine was injected into the same tissues.  The fascia lata was then closed over Hemovac drain with a running #1 V-Loc suture.  Deep subcu was closed with another V-lock superficial subcu interrupted 2-0 Vicryl and subcuticular running 4-0 Monocryl.  The drains were hooked to suction.  Incision cleaned and dried and Steri-Strips and a bulky sterile dressing applied.  She was placed into a knee immobilizer, awakened and transported to recovery in stable condition.  Note that, a surgical assistant was a medical necessity for this procedure to do it in a safe and expeditious manner.  Surgical assistance necessary for retraction of vital neurovascular structures and proper positioning of the limb for removal of the old prosthesis and safe placement and accurate placement of the new prosthesis.     Gaynelle Arabian, M.D.     FA/MEDQ  D:  08/11/2014  T:  08/11/2014  Job:  [id number]

## 2014-08-11 NOTE — Consult Note (Signed)
PCP:  [name], MD    Assessment/Plan 63 yo F with hx of  Morbid obesity, CKD, stage III, CHF, HTN, DM2 on insulin, OSA non-compliant with CPAP, hx of positive stress test but negative cardiac catheterization here after revision of Right hip prosthesis.  Was noted to have poor urine output wile in PACU which improved after 1L NS bolus.   Present on Admission:  . CKD (chronic kidney disease), stage III - will obtain renal labs, continue to monitor urine output, agree with gentle IVF, hold lasix for tonight but likely will need to restart to avoid fluid overload.  . Morbid obesity - chronic with multiple co morbidities, dietary consult . HTN (hypertension) - hold HCTZ, continue cozaar and monitor labs.  . Low urine output - patient was probably fluid down perioperatively due to poor PO intake, gentle IV hydration until able to take good PO and careful monitoring of Fluid status.  . Chronic systolic CHF (congestive heart failure) - hold lasix for tonight, patient on cozaar. Monitor daily weight, strict urine out put Diabetes mellitus the chronic kidney disease - while taking poor by mouth intake agreed with a lower dose of insulin. Written for sliding scale. We'll need to increase baseline insulin as her intake improves. Hypothyroidism -  continue home medications check TSH Obstructive sleep apnea - patient refuses to use CPAP this has been offered, patient and family has been educated Tobacco abuse ongoing  - spoke about importance of quitting at this point patient is not interested History of positives stress test -  repeat artery catheterization showed no evidence of coronary artery disease History of TIAs  - Plavix currently on hold due to recent surgery Reason for consult multiple medical problems:    HPI: [name] [name] is a 63 y.o. female   has a past medical history of History of hip replacement, total; MRSA infection; Arthritis; GERD (gastroesophageal reflux disease); Gout;  Diabetes mellitus; Hypertension; Thyroid disease; Hyperlipidemia; TIA (transient ischemic attack); Positive cardiac stress test (12/2013); cardiac cath (06/2014); Asthma; Headache; Loosening of prosthetic hip; Cramping of feet; Stress incontinence; History of transfusion; and Sleep apnea.   Patient had undergone right hip revision on 11/4. Her medical problems include DM 2 on insulin 84/69, HTN, systolic CHF and CKD with Cr at 1.6 1 week ago. She denies any recent chest pain or shortness of breath. Patient was not aware of CKD diagnosis. She is on Lasix 40 mg a day. Patient endorses peripheral edema. She has 2 pillow orthopnea. Denies any significant dyspnea. She has known hx of OSA but refuses CPAP. While being evaluated noted to have apneic episode with desaturation despite being on 2 L of O2.  In PACU patient was noted to have decreased urine output she was given 1L NS bolus and when was brought back up to the floor. Put out 50 ml of clear urine.  Hospitalist was called for consult for  Management of medical problems.   Review of Systems:    Pertinent positives include: Bilateral lower extremity swelling   Constitutional:  No weight loss, night sweats, Fevers, chills, fatigue, weight loss  HEENT:  No headaches, Difficulty swallowing,Tooth/dental problems,Sore throat,  No sneezing, itching, ear ache, nasal congestion, post nasal drip,  Cardio-vascular:  No chest pain, Orthopnea, PND, anasarca, dizziness, palpitations  GI:  No heartburn, indigestion, abdominal pain, nausea, vomiting, diarrhea, change in bowel habits, loss of appetite, melena, blood in stool, hematemesis Resp:  no shortness of breath at rest. No dyspnea on  exertion, No excess mucus, no productive cough, No non-productive cough, No coughing up of blood.No change in color of mucus.No wheezing. Skin:  no rash or lesions. No jaundice GU:  no dysuria, change in color of urine, no urgency or frequency. No straining to urinate.  No  flank pain.  Musculoskeletal:  No joint pain or no joint swelling. No decreased range of motion. No back pain.  Psych:  No change in mood or affect. No depression or anxiety. No memory loss.  Neuro: no localizing neurological complaints, no tingling, no weakness, no double vision, no gait abnormality, no slurred speech, no confusion  Otherwise ROS are negative except for above, 10 systems were reviewed  Past Medical History: Past Medical History  Diagnosis Date  . History of hip replacement, total   . Hx MRSA infection     abscess left groin  . Arthritis   . GERD (gastroesophageal reflux disease)   . Gout   . Diabetes mellitus   . Hypertension   . Thyroid disease   . Hyperlipidemia   . TIA (transient ischemic attack)      X2 NO RESIDUAL PROBLEMS  . Positive cardiac stress test 12/2013    anterior and lateral ischemia on Myoview  . Hx of cardiac cath 06/2014  . Asthma   . Headache   . Loosening of prosthetic hip   . Cramping of feet   . Stress incontinence   . History of transfusion   . Sleep apnea     UNABLE TO TOLERATE C PAP   Past Surgical History  Procedure Laterality Date  . Thyroidectomy    . Abdominal hysterectomy    . Laparoscopic cholecystectomy    . Forearm fracture surgery      Left arm  . Hernia repair      w/ mesh  . Incision and drainage abscess Left 02/04/2013    Procedure: INCISION AND DRAINAGE LEFT BUTTOCK ABSCESS; INCISION AND DRAINAGE LEFT BREAST ABSCESS;  Surgeon: Harl Bowie, MD;  Location: [place];  Service: General;  Laterality: Left;  . Irrigation and debridement abscess N/A 02/06/2013    Procedure: IRRIGATION AND DEBRIDEMENT BUTTOCK ABSCESS AND DRESSING CHANGE;  Surgeon: Harl Bowie, MD;  Location: [place];  Service: General;  Laterality: N/A;  . Irrigation and debridement abscess Left 02/08/2013    Procedure: IRRIGATION AND DEBRIDEMENT ABSCESS/DRESSING CHANGE;  Surgeon: Gwenyth Ober, MD;  Location: [place];  Service: General;  Laterality:  Left;  . Incision and drainage perirectal abscess Left 02/10/2013    Procedure: IRRIGATION AND DEBRIDEMENT OF BUTTOCK/PERINEAL ABSCESS;  Surgeon: [name]. Georgette Dover, MD;  Location: [place];  Service: General;  Laterality: Left;  . Incision and drainage abscess N/A 02/12/2013    Procedure: INCISION AND DEBRIDEMENT BUTTOCK WOUND ;  Surgeon: [name]. Georgette Dover, MD;  Location: [place];  Service: General;  Laterality: N/A;  . Incision and drainage abscess N/A 02/14/2013    Procedure: INCISION AND DRAINAGE/DRESSING CHANGE;  Surgeon: Harl Bowie, MD;  Location: [place];  Service: General;  Laterality: N/A;  . Incision and drainage perirectal abscess N/A 02/16/2013    Procedure: IRRIGATION AND DEBRIDEMENT PERINEAL ABSCESS;  Surgeon: Zenovia Jarred, MD;  Location: [place];  Service: General;  Laterality: N/A;  . Joint replacement  2010 / 2012   . Colon surgery  1995    DUE TO POLYP     Medications: Prior to Admission medications   Medication Sig Start Date End Date Taking? Authorizing Provider  allopurinol (ZYLOPRIM) 100 MG tablet Take 100 mg by mouth every morning.    Yes Historical Provider, MD  B Complex-C (B-COMPLEX WITH VITAMIN C) tablet Take 1 tablet by mouth 2 (two) times daily.   Yes Historical Provider, MD  Blood Glucose Monitoring Suppl (ONE TOUCH ULTRA MINI) W/DEVICE KIT  11/25/13  Yes Historical Provider, MD  budesonide-formoterol (SYMBICORT) 160-4.5 MCG/ACT inhaler Inhale 2 puffs into the lungs 2 (two) times daily.   Yes Historical Provider, MD  Cholecalciferol (VITAMIN D PO) Take 1 capsule by mouth every morning.   Yes Historical Provider, MD  esomeprazole (NEXIUM) 40 MG capsule Take 40 mg by mouth every morning.   Yes Historical Provider, MD  fluticasone (FLONASE) 50 MCG/ACT nasal spray Place 2 sprays into the nose as needed for allergies.    Yes Historical Provider, MD  furosemide (LASIX) 40 MG tablet Take 40 mg by mouth at bedtime.  04/07/13  Yes Historical Provider, MD  gemfibrozil (LOPID) 600  MG tablet Take 600 mg by mouth 2 (two) times daily before a meal.   Yes Historical Provider, MD  hydrochlorothiazide (,MICROZIDE/HYDRODIURIL,) 12.5 MG capsule Take 12.5 mg by mouth every morning.    Yes Historical Provider, MD  insulin aspart protamine-insulin aspart (NOVOLOG 70/30) (70-30) 100 UNIT/ML injection Inject 30-40 Units into the skin 2 (two) times daily with a meal. 40 units in the morning and 30 units in the evening.   Yes Historical Provider, MD  KLOR-CON M20 20 MEQ tablet Take 20 mEq by mouth at bedtime.  04/08/13  Yes Historical Provider, MD  levothyroxine (SYNTHROID, LEVOTHROID) 137 MCG tablet Take 137 mcg by mouth daily before breakfast.   Yes Historical Provider, MD  losartan (COZAAR) 100 MG tablet Take 1 tablet (100 mg total) by mouth every morning. 08/05/14  Yes Lorretta Harp, MD  Multiple Vitamin (MULTIVITAMIN WITH MINERALS) TABS Take 1 tablet by mouth every morning.    Yes Historical Provider, MD  omega-3 acid ethyl esters (LOVAZA) 1 G capsule Take 1 g by mouth 2 (two) times daily.    Yes Historical Provider, MD  polyvinyl alcohol (LIQUIFILM TEARS) 1.4 % ophthalmic solution Place 2 drops into both eyes daily as needed for dry eyes.   Yes Historical Provider, MD  rosuvastatin (CRESTOR) 10 MG tablet Take 10 mg by mouth at bedtime.   Yes Historical Provider, MD  tiZANidine (ZANAFLEX) 4 MG tablet Take 4 mg by mouth 2 (two) times daily.   Yes Historical Provider, MD  VESICARE 10 MG tablet Take 10 mg by mouth every morning.  09/24/13  Yes Historical Provider, MD  clopidogrel (PLAVIX) 75 MG tablet Take 75 mg by mouth every morning.    Historical Provider, MD  naftifine (NAFTIN) 1 % cream Apply 1 application topically daily as needed (rash).    Historical Provider, MD  ONE TOUCH ULTRA TEST test strip 1 each by Other route 3 (three) times daily as needed (testing blood sugar.).  11/25/13   Historical Provider, MD  oxyCODONE-acetaminophen (PERCOCET/ROXICET) 5-325 MG per tablet Take 1-2  tablets by mouth every 6 (six) hours as needed for moderate pain or severe pain.    Historical Provider, MD    Allergies:   Allergies  Allergen Reactions  . Ace Inhibitors     Tongue swell  . Morphine And Related Itching    Social History:  Ambulatory  cane,   Lives at home  With family     reports that she has been smoking Cigarettes.  She has  a 10 pack-year smoking history. She has never used smokeless tobacco. She reports that she drinks about 0.5 oz of alcohol per week. She reports that she does not use illicit drugs.    Family History: family history includes Cardiomyopathy in her mother; Diabetes in her brother.    Physical Exam: Patient Vitals for the past 24 hrs:  BP Temp Temp src Pulse Resp SpO2 Height Weight  08/11/14 2027 (!) 146/61 mmHg 97.8 F (36.6 C) - 69 12 92 % - -  08/11/14 2000 - 97.8 F (36.6 C) - 71 16 97 % - -  08/11/14 1945 (!) 143/83 mmHg - - 70 10 95 % - -  08/11/14 1930 (!) 168/75 mmHg - - 75 10 96 % - -  08/11/14 1915 (!) 162/93 mmHg - - 72 (!) 8 92 % - -  08/11/14 1912 (!) 167/83 mmHg - - - - - - -  08/11/14 1904 (!) 168/95 mmHg - - - - - - -  08/11/14 1902 (!) 175/81 mmHg - - - - - - -  08/11/14 1900 (!) 173/89 mmHg - - - - 98 % - -  08/11/14 1858 (!) 181/94 mmHg - - - - - - -  08/11/14 1854 (!) 159/87 mmHg - - - - - - -  08/11/14 1848 (!) 194/100 mmHg - - - - - - -  08/11/14 1846 (!) 187/104 mmHg - - - - - - -  08/11/14 1845 - - - 87 11 99 % - -  08/11/14 1843 (!) 189/108 mmHg - - - - - - -  08/11/14 1840 (!) 209/104 mmHg - - - - - - -  08/11/14 1838 (!) 199/101 mmHg - - - - - - -  08/11/14 1836 (!) 203/104 mmHg - - - - - - -  08/11/14 1834 (!) 191/97 mmHg - - - - - - -  08/11/14 1832 (!) 205/97 mmHg 98 F (36.7 C) - 91 14 95 % - -  08/11/14 1830 (!) 199/109 mmHg - - 90 17 95 % - -  08/11/14 1825 (!) 197/116 mmHg - - - - - - -  08/11/14 1311 - - - - - - '5\' 4"'  (1.626 m) (!) 140.615 kg (310 lb)  08/11/14 1237 (!) 175/78 mmHg 97.8 F  (36.6 C) Oral 77 16 96 % - -    1. General:  in No Acute distress 2. Psychological: somnolent but Oriented 3. Head/ENT:    Dy Mucous Membranes                          Head Non traumatic, neck supple                          Normal  dntition 4. SKIN: normal  sin turgor,  Skin clean Dry and intact no rash 5. Heart: Regular rate and rhythm no Murmur, Rub or gallop 6. Lungs: Clear to auscultation bilaterally, no wheezes or crackles   7. Abdomen: Soft, non-tender, Non distended 8. Lower extremities: no clubbing, cyanosis, or edema 9. Neurologically Grossly intact, moving all 4 extremities equally 10. MSK: Normal range of motion  body mass index is 53.19 kg/(m^2).   Labs on Admission:   Results for orders placed or performed during the hospital encounter of 08/11/14 (from the past 24 hour(s))  Glucose, capillary     Status: Abnormal   Collection Time: 08/11/14 12:34  PM  Result Value Ref Range   Glucose-Capillary 138 (H) 70 - 99 mg/dL  Glucose, capillary     Status: Abnormal   Collection Time: 08/11/14  6:55 PM  Result Value Ref Range   Glucose-Capillary 170 (H) 70 - 99 mg/dL    Lab Results  Component Value Date   HGBA1C 7.5* 12/23/2013    Estimated Creatinine Clearance: 49.1 mL/min (by C-G formula based on Cr of 1.65).  BNP (last 3 results) No results for input(s): PROBNP in the last 8760 hours.   Filed Weights   08/11/14 1311  Weight: 140.615 kg (310 lb)    Radiological Exams on Admission: Dg Pelvis Portable  08/11/2014   CLINICAL DATA:  Subsequent encounter for postop right hip surgery today.  EXAM: PORTABLE PELVIS 1-2 VIEWS  COMPARISON:  02/09/2013.  FINDINGS: Frontal pelvis shows interval revision of the right total hip arthroplasty. Fine detail is limited by portable technique. No gross hardware complication at this time.  IMPRESSION: Status post right total hip revision without evidence for complicating features.   Electronically Signed   By: Misty Stanley M.D.    On: 08/11/2014 18:38    Chart has been reviewed   CODE STATUS:  FULL CODE   Other plan as per orders.  I have spent a total of 65  on this consult thank you fall on his to participate in patient's care  Vance 08/11/2014, 9:03 PM  [facility]  Pager [phone]   after 2 AM please page floor coverage PA If 7AM-7PM, please contact the day team taking care of the patient  [email or web address]  Password [email or web address]

## 2014-08-11 NOTE — Brief Op Note (Signed)
08/11/2014  6:33 PM  PATIENT:  [name]  63 y.o. female  PRE-OPERATIVE DIAGNOSIS:  loose right total hip arthroplasty  POST-OPERATIVE DIAGNOSIS:  loose right total hip arthroplasty  PROCEDURE:  Procedure(s): RIGHT ACETABULAR REVISION (Right)  SURGEON:  Surgeon(s) and Role:    * Gearlean Alf, MD - Primary  PHYSICIAN ASSISTANT:   ASSISTANTS: Arlee Muslim, PA-C   ANESTHESIA:   general  EBL:  Total I/O In: 1200 [I.V.:1200] Out: 300 [Urine:50; Blood:250]  BLOOD ADMINISTERED:none  DRAINS: (Medium) Hemovact drain(s) in the right hip with  Suction Open   LOCAL MEDICATIONS USED:  OTHER Expare  COUNTS:  YES  TOURNIQUET:  * No tourniquets in log *  DICTATION: .Other Dictation: Dictation Number [id number]  PLAN OF CARE: Admit to inpatient   PATIENT DISPOSITION:  PACU - hemodynamically stable.

## 2014-08-11 NOTE — H&P (View-Only) (Signed)
[name] [name] DOB: [birth year] Married / Language: [name] [name] / Race: Black or African American, White Female Date of Admission:  08-11-2014 Chief Complaint:  Loose Right Total Hip Arthroplasty History of Present Illness The patient is a 63 year old female who comes in  for a preoperative History and Physical. The patient is scheduled for a right actetabular revison versus total hip revision to be performed by Dr. Dione Plover. Aluisio, MD at [facility] on 08/11/2014. The patient is a 63 year old female who presents with a hip problem. The patient was seen for a second opinion.The patient reports right hip problems including pain symptoms that have been present for 5 year(s). The symptoms began without any known injury. Prior to being seen today the patient was previously evaluated by a colleague 5 year(s) ago. Symptoms present at the patient's previous evaluation included pain in the hip. Previous workup for this problem has included pelvic x-rays, hip x-rays, spinal MRI, hip MRI, bone scan and surgical consultation. She had the hip replaced 08/05/10. She had complications after that and had the liner revised from metal to poly on 08/02/12. She continues to have pain. She states that she did fairly well initially after the initial procedure in 2011. She rapidly started to have increasing pain along the lateral aspect of the hip and right groin. She also had a metallic taste in her mouth. She was revised from metal on metal to metal on polyethylene in 2013. That did well initially, but then she started to develop this increasing pain again. She is at her wit's end now. She is hurting at all times. Pain is throughout the lateral hip and right groin. It does radiate down to the anterior thigh. It is worse with weight bearing but also does occur at rest. She's not having any lower extremity weakness or paresthesias associated with this. She is very discouraged and upset. She had a bone scan and was  told that the bone scan did not show abnormality. Unfortunately, we don't have old x-rays to see if the cup has actually shifted. I feel it probably has shifted because she's got excessive polyethylene wear, as she's got excessive loads going in a superior position from the verticality of the cup. I have told her that there is no definitive loosening of the socket but she probably has loosening; regardless, she has excessive wear at an early state and either has a problem with the polyethylene itself or there are abnormal stresses on it. Unfortunately, the only thing that is going to potentially get her better is to revise this once again. Even if the cup is not loose, we may have to revise the cup to put it in a position where the poly will not be as stressed and will not wear as quickly. We discussed all this in detail, the procedure, risks and potential complications, rehab. course and discussed that there is a possibility that her pain might not get better with revision surgery. I was very clear about this, but she said she is at her wit's end and feels like she needs to do something. She is ready to proceed with surgery at this time. They have been treated conservatively in the past for the above stated problem and despite conservative measures, they continue to have progressive pain and severe functional limitations and dysfunction. They have failed non-operative management including home exercise, medications, and one revision procedure in 2013. It is felt that they would benefit from undergoing revison total joint replacement.  Risks and benefits of the procedure have been discussed with the patient and they elect to proceed with surgery. There are no active contraindications to surgery such as ongoing infection or rapidly progressive neurological disease.  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 First Degree Relatives reported Diabetes Mellitus Brother, Maternal Grandmother. Heart Disease Mother. Hypertension Brother, Maternal Grandmother, Mother, Sister. child Heart disease in female family member before age 76  Social History Children 1 Current drinker 06/04/2014: Currently drinks wine only occasionally per week Exercise Exercises rarely Current work status disabled Tobacco use Current every day smoker. 06/04/2014: smoke(d) less than 1/2 pack(s) per day Marital status married Living situation live alone No history of drug/alcohol rehab Number of flights of stairs before winded less than 1 Not under pain contract  Medication History Gemfibrozil (600MG  Tablet, Oral) Active. Hydrochlorothiazide (12.5MG  Tablet, Oral) Active. Flonase (50MCG/ACT Suspension, Nasal) Active. Allopurinol (100MG  Tablet, Oral) Active. ProAir HFA (108 (90 Base)MCG/ACT Aerosol Soln, Inhalation) Active. VESIcare (10MG  Tablet, Oral) Active. Furosemide (40MG  Tablet, Oral) Active. NovoLOG Mix 70/30 FlexPen ((70-30) 100UNIT/ML Susp Pen-inj, Subcutaneous) Active. Potassium Chloride Crys ER (20MEQ Tablet ER, Oral) Active. Levothyroxine Sodium (137MCG Tablet, Oral) Active. Ferrous Sulfate (325 (65 Fe)MG Tablet, Oral) Active. Crestor (10MG  Tablet, Oral) Active. Oxycodone-Acetaminophen (5-325MG  Tablet, Oral) Active. Symbicort (160-4.5MCG/ACT Aerosol, Inhalation) Active. Meloxicam (15MG  Tablet, Oral) Active. NexIUM (40MG  Capsule DR, Oral) Active. Clopidogrel  Bisulfate (75MG  Tablet, Oral) Active. (Plavix) Losartan Potassium (100MG  Tablet, Oral) Active. Vitamin D (400UNIT Tablet, Oral) Active. Lovaza (1GM Capsule, Oral) Active. (Omega 3)   Past Surgical History Thyroidectomy; Total Inguinal Hernia Repair open: left Total Hip Replacement right Tubal Ligation Colectomy partial Carpal Tunnel Repair bilateral Colon Polyp Removal - Colonoscopy Hysterectomy complete (non-cancerous) Gallbladder Surgery open   Review of Systems General Present- Night Sweats. Not Present- Chills, Fatigue, Fever, Memory Loss, Weight Gain and Weight Loss. Skin Not Present- Eczema, Hives, Itching, Lesions and Rash. HEENT Present- Blurred Vision. Not Present- Dentures, Double Vision, Headache, Hearing Loss, Tinnitus and Visual Loss. Respiratory Not Present- Allergies, Chronic Cough, Coughing up blood, Shortness of breath at rest and Shortness of breath with exertion. Cardiovascular Not Present- Chest Pain, Difficulty Breathing Lying Down, Murmur, Palpitations, Racing/skipping heartbeats and Swelling. Gastrointestinal Not Present- Abdominal Pain, Bloody Stool, Constipation, Diarrhea, Difficulty Swallowing, Heartburn, Jaundice, Loss of appetitie, Nausea and Vomiting. Female Genitourinary Present- Urinary frequency and Urinating at Night. Not Present- Blood in Urine, Discharge, Flank Pain, Incontinence, Painful Urination, Urgency, Urinary Retention and Weak urinary stream. Musculoskeletal Present- Joint Pain, Joint Swelling and Spasms. Not Present- Back Pain, Morning Stiffness, Muscle Pain and Muscle Weakness. Neurological Not Present- Blackout spells, Difficulty with balance, Dizziness, Paralysis, Tremor and Weakness. Psychiatric Not Present- Insomnia.  Vitals  Height: 64in Pulse: 76 (Regular)  BP: 168/86 (Sitting, Right Arm, Standard)   Physical Exam General Mental Status -Alert, cooperative and good historian. General Appearance-pleasant, Not  in acute distress. Orientation-Oriented X3. Build & Nutrition-Well nourished and Well developed.  Head and Neck Head-normocephalic, atraumatic . Neck Global Assessment - supple, no bruit auscultated on the right, no bruit auscultated on the left. Note: upper denture plate   Eye Pupil - Bilateral-Regular  and Round. Motion - Bilateral-EOMI.  Chest and Lung Exam Auscultation Breath sounds - clear at anterior chest wall and clear at posterior chest wall. Adventitious sounds - No Adventitious sounds.  Cardiovascular Auscultation Rhythm - Regular rate and rhythm. Heart Sounds - S1 WNL and S2 WNL. Murmurs & Other Heart Sounds - Auscultation of the heart reveals - No Murmurs.  Abdomen Palpation/Percussion Tenderness - Abdomen is non-tender to palpation. Rigidity (guarding) - Abdomen is soft. Auscultation Auscultation of the abdomen reveals - Bowel sounds normal.  Female Genitourinary Note: Not done, not pertinent to present illness   Musculoskeletal Note: On exam, she's A&O, in no apparent distress. Evaluation of her right hip shows flexion to 110, rotation in 20, out 30, abduction 30 with pain on ROM. She does have some tenderness of the greater trochanter. Palpation there does not reproduce her pain. Left hip shows pain-free ROM.  RADIOGRAPHS: AP pelvis and AP and lateral of the right hip show that she has a Corail stem in good position with no abnormalities. She has a Pinnacle cup which, to me, looks like it has probably shifted. It is abducted and anteverted and probably has shifted at some point. She has an excessive amount of polyethylene wear in this hip. She does have some osteolysis in the inferior pubic ramus.  Bone scan. For the most part it looks normal, but there is one window where she has intense uptake around the acetabular component.   Assessment & Plan Complications of internal orthopedic device right hip, implant, and graft  (T84.9XXA)  Note:Plan is for a Right Acetabular versus Total Hip Revision by Dr. Wynelle Link.  Plan is to go home following the procedure.  PCP - Dr. [name] Neurology - Dr. Krista Blue - Patient has been seen preoperatively and felt to be stable for surgery. Dr. Krista Blue detailed a letter stating that the patient could come off her Plavix for 5 days prior to surgery, but to be aware that she is at high risk for recurrent stroke with her mulitple vascualr risk factors.  Cardiology - Dr. Adora Fridge - Patient has been seen preoperatively and felt to be stable for surgery.  Topical TXA - history of CVA/TIA  Signed electronically by Joelene Millin, III PA-C

## 2014-08-11 NOTE — Interval H&P Note (Signed)
History and Physical Interval Note:  08/11/2014 3:20 PM  [name] [name]  has presented today for surgery, with the diagnosis of loose right total hip arthroplasty  The various methods of treatment have been discussed with the patient and family. After consideration of risks, benefits and other options for treatment, the patient has consented to  Procedure(s): RIGHT ACETABULAR VS RIGHT TOTAL HIP REVISION (Right) as a surgical intervention .  The patient's history has been reviewed, patient examined, no change in status, stable for surgery.  I have reviewed the patient's chart and labs.  Questions were answered to the patient's satisfaction.     Gearlean Alf

## 2014-08-11 NOTE — Anesthesia Postprocedure Evaluation (Addendum)
  Anesthesia Post-op Note  Patient: [name] [name]  Procedure(s) Performed: Procedure(s) (LRB): RIGHT ACETABULAR REVISION (Right)  Patient Location: PACU  Anesthesia Type: General  Level of Consciousness: awake and alert   Airway and Oxygen Therapy: Patient Spontanous Breathing  Post-op Pain: mild  Post-op Assessment: Post-op Vital signs reviewed, Patient's Cardiovascular Status Stable, Respiratory Function Stable, Patent Airway and No signs of Nausea or vomiting  Last Vitals:  Filed Vitals:   08/11/14 1930  BP: 168/75  Pulse: 75  Temp:   Resp: 10    Post-op Vital Signs: stable   Complications: Low urine output in PACU. She urinated just before going to the OR. Only 50 cc urine in the OR. 1200 cc IVF in OR, EBL 250. 750 cc crystalloid bolus in PACU with increased urine output. Bladder scan performed and showing empty bladder. EF 32 % on recent nuclear medicine imaging and EF 45-50% on ECHO in January 2015. Cr 1.65; H/O Acute renal failure and CKD stage 3.  I have called the ortho service and recommended hospitalist consult to manage this complicated patient on the floor.

## 2014-08-11 NOTE — Anesthesia Preprocedure Evaluation (Addendum)
Anesthesia Evaluation  Patient identified by MRN, date of birth, ID band Patient awake    Reviewed: Allergy & Precautions, H&P , NPO status , Patient's Chart, lab work & pertinent test results  Airway Mallampati: II  TM Distance: >3 FB Neck ROM: Full    Dental no notable dental hx.    Pulmonary asthma , sleep apnea , Current Smoker,  CXR: reviewed. breath sounds clear to auscultation  Pulmonary exam normal       Cardiovascular hypertension, Pt. on medications + Peripheral Vascular Disease Rhythm:Regular Rate:Normal  ECG: reviewed.  Cath 07-02-14 reviewed: normal coronaries.  Clearance Dr. Gwenlyn Found 07-06-14   Neuro/Psych  Headaches, TIAnegative psych ROS   GI/Hepatic Neg liver ROS, GERD-  Medicated,  Endo/Other  diabetes, Insulin Dependent  Renal/GU Renal disease  negative genitourinary   Musculoskeletal  (+) Arthritis -,   Abdominal   Peds negative pediatric ROS (+)  Hematology  (+) anemia ,   Anesthesia Other Findings   Reproductive/Obstetrics negative OB ROS                         Anesthesia Physical Anesthesia Plan  ASA: III  Anesthesia Plan: General   Post-op Pain Management:    Induction: Intravenous  Airway Management Planned: Oral ETT  Additional Equipment:   Intra-op Plan:   Post-operative Plan: Extubation in OR  Informed Consent: I have reviewed the patients History and Physical, chart, labs and discussed the procedure including the risks, benefits and alternatives for the proposed anesthesia with the patient or authorized representative who has indicated his/her understanding and acceptance.   Dental advisory given  Plan Discussed with: CRNA  Anesthesia Plan Comments:         Anesthesia Quick Evaluation

## 2014-08-12 ENCOUNTER — Encounter (HOSPITAL_COMMUNITY): Payer: Self-pay

## 2014-08-12 DIAGNOSIS — T84098D Other mechanical complication of other internal joint prosthesis, subsequent encounter: Secondary | ICD-10-CM

## 2014-08-12 DIAGNOSIS — E039 Hypothyroidism, unspecified: Secondary | ICD-10-CM

## 2014-08-12 DIAGNOSIS — G4733 Obstructive sleep apnea (adult) (pediatric): Secondary | ICD-10-CM

## 2014-08-12 LAB — CBC
HCT: 33.5 % — ABNORMAL LOW (ref 36.0–46.0)
Hemoglobin: 10 g/dL — ABNORMAL LOW (ref 12.0–15.0)
MCH: 26 pg (ref 26.0–34.0)
MCHC: 29.9 g/dL — ABNORMAL LOW (ref 30.0–36.0)
MCV: 87 fL (ref 78.0–100.0)
Platelets: 313 10*3/uL (ref 150–400)
RBC: 3.85 MIL/uL — ABNORMAL LOW (ref 3.87–5.11)
RDW: 15.8 % — ABNORMAL HIGH (ref 11.5–15.5)
WBC: 12 10*3/uL — ABNORMAL HIGH (ref 4.0–10.5)

## 2014-08-12 LAB — GLUCOSE, CAPILLARY
Glucose-Capillary: 192 mg/dL — ABNORMAL HIGH (ref 70–99)
Glucose-Capillary: 161 mg/dL — ABNORMAL HIGH (ref 70–99)
Glucose-Capillary: 123 mg/dL — ABNORMAL HIGH (ref 70–99)
Glucose-Capillary: 205 mg/dL — ABNORMAL HIGH (ref 70–99)

## 2014-08-12 LAB — BASIC METABOLIC PANEL
Anion gap: 13 (ref 5–15)
BUN: 42 mg/dL — ABNORMAL HIGH (ref 6–23)
CO2: 25 mEq/L (ref 19–32)
Calcium: 9.2 mg/dL (ref 8.4–10.5)
Chloride: 101 mEq/L (ref 96–112)
Creatinine, Ser: 1.52 mg/dL — ABNORMAL HIGH (ref 0.50–1.10)
GFR calc Af Amer: 41 mL/min — ABNORMAL LOW (ref >90)
GFR calc non Af Amer: 35 mL/min — ABNORMAL LOW (ref >90)
Glucose, Bld: 177 mg/dL — ABNORMAL HIGH (ref 70–99)
Potassium: 4.1 mEq/L (ref 3.7–5.3)
Sodium: 139 mEq/L (ref 137–147)

## 2014-08-12 LAB — HEMOGLOBIN A1C
Hgb A1c MFr Bld: 6.9 % — ABNORMAL HIGH (ref <5.7)
Mean Plasma Glucose: 151 mg/dL — ABNORMAL HIGH (ref <117)

## 2014-08-12 LAB — TSH: TSH: 0.381 u[IU]/mL (ref 0.350–4.500)

## 2014-08-12 MED ORDER — DIPHENHYDRAMINE HCL 25 MG PO CAPS: 25.0000 mg | ORAL_CAPSULE | Freq: Four times a day (QID) | ORAL | Status: DC | PRN

## 2014-08-12 MED ORDER — ESOMEPRAZOLE MAGNESIUM 20 MG PO CPDR
40.0000 mg | DELAYED_RELEASE_CAPSULE | Freq: Every morning | ORAL | Status: DC
  Administered 2014-08-12 – 2014-08-13 (×2): 40 mg via ORAL
  Filled 2014-08-12 (×2): qty 2

## 2014-08-12 NOTE — Progress Notes (Signed)
UR completed.  Mariane Masters, BSN, CM [phone].

## 2014-08-12 NOTE — Plan of Care (Signed)
Problem: Phase I Progression Outcomes Goal: Incision/dressings dry and intact Outcome: Completed/Met Date Met:  08/12/14     

## 2014-08-12 NOTE — Evaluation (Signed)
Occupational Therapy Evaluation Patient Details Name: [name] [name] MRN: [id number] DOB: Apr 02, 1951 Today's Date: 08/12/2014    History of Present Illness R THR revision   Clinical Impression   This 63 year old female was admitted for the above surgery.  She will benefit from skilled OT to increase safety and independence with adls; goals in acute are for supervision level following posterior THPs.  Prior to sx, pt was mod I with adls.     Follow Up Recommendations  Home health OT;Supervision/Assistance - 24 hour    Equipment Recommendations  3 in 1 bedside comode    Recommendations for Other Services       Precautions / Restrictions Precautions Precautions: Posterior Hip;Fall Restrictions Other Position/Activity Restrictions: WBAT      Mobility Bed Mobility               General bed mobility comments: not tested with OT  Transfers   Equipment used: Rolling walker (2 wheeled) Transfers: Sit to/from Stand Sit to Stand: Min assist         General transfer comment: cues for UE/LE placement    Balance                                            ADL Overall ADL's : Needs assistance/impaired     Grooming: Set up;Sitting   Upper Body Bathing: Set up;Sitting   Lower Body Bathing: Moderate assistance;Sit to/from stand   Upper Body Dressing : Set up;Sitting   Lower Body Dressing: Maximal assistance;Sit to/from stand   Toilet Transfer: Minimal assistance;BSC;Stand-pivot   Toileting- Clothing Manipulation and Hygiene: Maximal assistance;Sit to/from stand         General ADL Comments: Pt had last hip sx in '12.  Reviewed ADLs and THPs.  Pt needs reinforcement.  She stil has a reacher at home but no longer has a sock aide.  She wants to be as independent as possible.  Will work through ADL on next visit.       Vision                     Perception     Praxis      Pertinent Vitals/Pain Pain Score: 8  Pain Location: R  hip Pain Descriptors / Indicators: Aching Pain Intervention(s): Limited activity within patient's tolerance     Hand Dominance Right   Extremity/Trunk Assessment Upper Extremity Assessment Upper Extremity Assessment: Overall WFL for tasks assessed           Communication Communication Communication: No difficulties   Cognition Arousal/Alertness: Awake/alert Behavior During Therapy: WFL for tasks assessed/performed Overall Cognitive Status: Within Functional Limits for tasks assessed                     General Comments       Exercises       Shoulder Instructions      Home Living Family/patient expects to be discharged to:: Private residence Living Arrangements: Spouse/significant other Available Help at Discharge: Family Type of Home: Apartment Home Access: Stairs to enter Technical brewer of Steps: 2 Entrance Stairs-Rails: Right Home Layout: One level               Home Equipment: Cane - single point          Prior Functioning/Environment Level of Independence: Independent with assistive device(s)  OT Diagnosis: Generalized weakness;Acute pain   OT Problem List: Decreased strength;Decreased activity tolerance;Decreased knowledge of use of DME or AE;Decreased knowledge of precautions;Pain   OT Treatment/Interventions: Self-care/ADL training;DME and/or AE instruction;Patient/family education    OT Goals(Current goals can be found in the care plan section) Acute Rehab OT Goals OT Goal Formulation: With patient Time For Goal Achievement: 08/26/14 Potential to Achieve Goals: Good  OT Frequency: Min 2X/week   Barriers to D/C:            Co-evaluation              End of Session    Activity Tolerance: Patient tolerated treatment well Patient left: in chair;with call bell/phone within reach   Time: 1400-1426 OT Time Calculation (min): 26 min Charges:  OT General Charges $OT Visit: 1 Procedure OT  Evaluation $Initial OT Evaluation Tier I: 1 Procedure OT Treatments $Self Care/Home Management : 8-22 mins G-Codes:    [name] Aug 14, 2014, 3:53 PM  [name], OTR/L [phone] 08-14-14

## 2014-08-12 NOTE — Evaluation (Signed)
Physical Therapy Evaluation Patient Details Name: [name] [name] MRN: [id number] DOB: Jan 26, 1951 Today's Date: 08/12/2014   History of Present Illness  R THR revision  Clinical Impression  Pt s/p R THR revision presents with decreased R LE strength/ROM, post op pain, and posterior THP limiting functional mobility.  Pt should progress to d/c home with family assist and HHPT follow up.    Follow Up Recommendations Home health PT    Equipment Recommendations  Rolling walker with 5" wheels (Pt will need WIDE RW)    Recommendations for Other Services OT consult     Precautions / Restrictions Precautions Precautions: Posterior Hip;Fall Restrictions Weight Bearing Restrictions: No Other Position/Activity Restrictions: WBAT      Mobility  Bed Mobility Overal bed mobility: Needs Assistance Bed Mobility: Supine to Sit;Sit to Supine     Supine to sit: Min assist;Mod assist;+2 for physical assistance     General bed mobility comments: cues for sequence and use of L LE to self assist  Transfers Overall transfer level: Needs assistance Equipment used: Rolling walker (2 wheeled) Transfers: Sit to/from Stand Sit to Stand: Min assist;From elevated surface;+2 physical assistance         General transfer comment: cues for LE management and use of UEs to self assist  Ambulation/Gait Ambulation/Gait assistance: Min assist;+2 physical assistance Ambulation Distance (Feet): 50 Feet Assistive device: Rolling walker (2 wheeled) Gait Pattern/deviations: Step-to pattern;Decreased step length - right;Decreased step length - left;Shuffle Gait velocity: decr   General Gait Details: cues for sequence, posture, position from [employer]            Wheelchair Mobility    Modified Rankin (Stroke Patients Only)       Balance                                             Pertinent Vitals/Pain Pain Assessment: 0-10 Pain Score: 4  Pain Location: R hip Pain  Descriptors / Indicators: Aching;Sore Pain Intervention(s): Limited activity within patient's tolerance;Monitored during session;Premedicated before session;Ice applied    Home Living Family/patient expects to be discharged to:: Private residence Living Arrangements: Spouse/significant other Available Help at Discharge: Family Type of Home: Apartment Home Access: Stairs to enter Entrance Stairs-Rails: Right Entrance Stairs-Number of Steps: 2 Home Layout: One level Home Equipment: Cane - single point      Prior Function Level of Independence: Independent with assistive device(s)               Hand Dominance   Dominant Hand: Right    Extremity/Trunk Assessment   Upper Extremity Assessment: Overall WFL for tasks assessed           Lower Extremity Assessment: RLE deficits/detail RLE Deficits / Details: 2+/5 hip strength with AAROM to 75 flex and 15 abd    Cervical / Trunk Assessment: Normal  Communication   Communication: No difficulties  Cognition Arousal/Alertness: Awake/alert Behavior During Therapy: WFL for tasks assessed/performed Overall Cognitive Status: Within Functional Limits for tasks assessed                      General Comments      Exercises Total Joint Exercises Ankle Circles/Pumps: AROM;Both;15 reps;Supine Quad Sets: AROM;Both;10 reps;Supine Heel Slides: AAROM;15 reps;Supine;Right Hip ABduction/ADduction: AAROM;Right;10 reps;Supine      Assessment/Plan    PT Assessment Patient needs continued PT services  PT  Diagnosis Difficulty walking   PT Problem List Decreased strength;Decreased range of motion;Decreased activity tolerance;Decreased mobility;Decreased knowledge of use of DME;Pain;Obesity;Decreased knowledge of precautions  PT Treatment Interventions DME instruction;Gait training;Stair training;Functional mobility training;Therapeutic activities;Therapeutic exercise;Patient/family education   PT Goals (Current goals can be  found in the Care Plan section) Acute Rehab PT Goals Patient Stated Goal: Resume previous lifestyle with decreased pain PT Goal Formulation: With patient Time For Goal Achievement: 08/19/14 Potential to Achieve Goals: Good    Frequency 7X/week   Barriers to discharge        Co-evaluation               End of Session Equipment Utilized During Treatment: Gait belt Activity Tolerance: Patient tolerated treatment well Patient left: in chair;with call bell/phone within reach Nurse Communication: Mobility status         Time: 3254-9826 PT Time Calculation (min): 33 min   Charges:   PT Evaluation $Initial PT Evaluation Tier I: 1 Procedure PT Treatments $Gait Training: 8-22 mins $Therapeutic Exercise: 8-22 mins   PT G Codes:          Sheng Pritz 08/12/2014, 12:43 PM

## 2014-08-12 NOTE — Progress Notes (Signed)
RT offered CPAP to Pt and she declined for tonight.  Pt reminded to call RT if she changes her mind.  RT to monitor and assess as needed.

## 2014-08-12 NOTE — Progress Notes (Signed)
Physical Therapy Treatment Patient Details Name: [name] [name] MRN: [id number] DOB: May 18, 1951 Today's Date: 2014/08/14    History of Present Illness R THR revision    PT Comments    Pt progressing with increased activity tolerance this pm.  Follow Up Recommendations  Home health PT     Equipment Recommendations  Rolling walker with 5" wheels    Recommendations for Other Services OT consult     Precautions / Restrictions Precautions Precautions: Posterior Hip;Fall Precaution Booklet Issued: Yes (comment) Precaution Comments: sign hung on wall Restrictions Weight Bearing Restrictions: No Other Position/Activity Restrictions: WBAT    Mobility  Bed Mobility Overal bed mobility: Needs Assistance Bed Mobility: Sit to Supine       Sit to supine: Min assist;Mod assist   General bed mobility comments: not tested with OT  Transfers Overall transfer level: Needs assistance Equipment used: Rolling walker (2 wheeled) Transfers: Sit to/from Stand Sit to Stand: Min assist         General transfer comment: cues for UE/LE placement  Ambulation/Gait Ambulation/Gait assistance: Min assist Ambulation Distance (Feet): 88 Feet Assistive device: Rolling walker (2 wheeled) Gait Pattern/deviations: Step-to pattern;Decreased step length - right;Decreased step length - left;Shuffle Gait velocity: decr   General Gait Details: cues for sequence, posture, position from [employer]            Wheelchair Mobility    Modified Rankin (Stroke Patients Only)       Balance                                    Cognition Arousal/Alertness: Awake/alert Behavior During Therapy: WFL for tasks assessed/performed Overall Cognitive Status: Within Functional Limits for tasks assessed                      Exercises      General Comments        Pertinent Vitals/Pain Pain Assessment: 0-10 Pain Score: 8  Pain Location: R hip Pain Descriptors /  Indicators: Aching Pain Intervention(s): Limited activity within patient's tolerance    Home Living Family/patient expects to be discharged to:: Private residence Living Arrangements: Spouse/significant other Available Help at Discharge: Family Type of Home: Apartment Home Access: Stairs to enter Entrance Stairs-Rails: Right Home Layout: One level Home Equipment: Cane - single point      Prior Function Level of Independence: Independent with assistive device(s)          PT Goals (current goals can now be found in the care plan section) Acute Rehab PT Goals Patient Stated Goal: Resume previous lifestyle with decreased pain PT Goal Formulation: With patient Time For Goal Achievement: 08/19/14 Potential to Achieve Goals: Good Progress towards PT goals: Progressing toward goals    Frequency  7X/week    PT Plan Current plan remains appropriate    Co-evaluation             End of Session Equipment Utilized During Treatment: Gait belt Activity Tolerance: Patient tolerated treatment well Patient left: with call bell/phone within reach;in bed     Time: 1440-1507 PT Time Calculation (min): 27 min  Charges:  $Gait Training: 8-22 mins $Therapeutic Activity: 8-22 mins                    G Codes:      Ichelle Harral 2014/08/14, 4:45 PM

## 2014-08-12 NOTE — Care Management Note (Signed)
    Page 1 of 2   08/12/2014     3:35:41 PM CARE MANAGEMENT NOTE 08/12/2014  Patient:  [name],[name]   Account Number:  [id number]  Date Initiated:  08/12/2014  Documentation initiated by:  [facility]  Subjective/Objective Assessment:   adm:  RIGHT ACETABULAR REVISION (Right)     Action/Plan:   discharge planning   Anticipated DC Date:  08/12/2014   Anticipated DC Plan:  [place]  CM consult      [facility] Choice  HOME HEALTH   Choice offered to / List presented to:  C-1 Patient   DME arranged  [place]      DME agency  [place] arranged  HH-2 PT      [place]   Status of service:  In process, will continue to follow Medicare Important Message given?   (If response is "NO", the following Medicare IM given date fields will be blank) Date Medicare IM given:   Medicare IM given by:   Date Additional Medicare IM given:   Additional Medicare IM given by:    Discharge Disposition:  [place]  Per UR Regulation:    If discussed at Long Length of Stay Meetings, dates discussed:    Comments:  08/12/14 07:45 CM met with pt in room to offer choice of home health agency.  Pt chooses [facility] to render HHPT.  Pt needs rolling walker and CM waiting for MD to place DME order Mcarthur Rossetti requires MD electronic signature).  CM will fax request to [facility] ins.). Address and contact information verified with pt.  Referral called to [facility] for HHPT.  No other CM needs were communicated. Mariane Masters, BSN, CM [phone].

## 2014-08-12 NOTE — Progress Notes (Addendum)
Subjective: 1 Day Post-Op Procedure(s) (LRB): RIGHT ACETABULAR REVISION (Right) Patient reports pain as mild and moderate.   Patient seen in rounds by Dr. Wynelle Link. Patient is well, but has had some minor complaints of pain in the hip, requiring pain medications We will start therapy today.  Plan is to go Home after hospital stay.  Objective: Vital signs in last 24 hours: Temp:  [97.7 F (36.5 C)-98.1 F (36.7 C)] 98.1 F (36.7 C) (11/05 0525) Pulse Rate:  [68-91] 84 (11/05 0525) Resp:  [8-20] 18 (11/05 0525) BP: (143-209)/(61-116) 163/87 mmHg (11/05 0525) SpO2:  [92 %-99 %] 97 % (11/05 0525) Weight:  [140.615 kg (310 lb)] 140.615 kg (310 lb) (11/04 1311)  Intake/Output from previous day:  Intake/Output Summary (Last 24 hours) at 08/12/14 0726 Last data filed at 08/12/14 0600  Gross per 24 hour  Intake   2015 ml  Output   1225 ml  Net    790 ml   Labs:  Recent Labs  08/11/14 2112 08/12/14 0448  HGB 11.1* 10.0*    Recent Labs  08/11/14 2112 08/12/14 0448  WBC 18.9* 12.0*  RBC 4.21 3.85*  HCT 36.6 33.5*  PLT 307 313    Recent Labs  08/11/14 2112 08/12/14 0448  NA 139 139  K 4.1 4.1  CL 100 101  CO2 25 25  BUN 43* 42*  CREATININE 1.56* 1.52*  GLUCOSE 180* 177*  CALCIUM 9.5 9.2   No results for input(s): LABPT, INR in the last 72 hours.  EXAM General - Patient is Alert, Appropriate and Oriented Extremity - Neurovascular intact Sensation intact distally Dorsiflexion/Plantar flexion intact Dressing - dressing C/D/I Motor Function - intact, moving foot and toes well on exam.  Hemovac pulled without difficulty.  Past Medical History  Diagnosis Date  . History of hip replacement, total   . Hx MRSA infection     abscess left groin  . Arthritis   . GERD (gastroesophageal reflux disease)   . Gout   . Diabetes mellitus   . Hypertension   . Thyroid disease   . Hyperlipidemia   . TIA (transient ischemic attack)      X2 NO RESIDUAL PROBLEMS  .  Positive cardiac stress test 12/2013    anterior and lateral ischemia on Myoview  . Hx of cardiac cath 06/2014  . Asthma   . Headache   . Loosening of prosthetic hip   . Cramping of feet   . Stress incontinence   . History of transfusion   . Sleep apnea     UNABLE TO TOLERATE C PAP    Assessment/Plan: 1 Day Post-Op Procedure(s) (LRB): RIGHT ACETABULAR REVISION (Right) Principal Problem:   Failed total hip arthroplasty Active Problems:   Tobacco abuse   DM (diabetes mellitus)   CKD (chronic kidney disease), stage III   Morbid obesity   HTN (hypertension)   Low urine output   Chronic systolic CHF (congestive heart failure)   Asthma, chronic   Hypothyroidism   OSA (obstructive sleep apnea)  Estimated body mass index is 53.19 kg/(m^2) as calculated from the following:   Height as of this encounter: 5\' 4"  (1.626 m).   Weight as of this encounter: 140.615 kg (310 lb). Advance diet Up with therapy Discharge home with home health  DVT Prophylaxis - Xarelto for three weeks and then resume her Plavix Weight Bearing As Tolerated right Leg D/C Knee Immobilizer Hemovac Pulled Begin Therapy Hip Preacutions  Appreciate Medicine consult last night.  CKD - UOP good since last night.  I&O's look okay at this point Hx of TIA's - on Plavix preop.  Held five days prior to surgery.  Currently on Xarelto for three weeks postop and then will resume the Plavix. Cozaar - pressures have been on higher side but put parameter if drops to prevent hypotension.  Arlee Muslim, PA-C Orthopaedic Surgery 08/12/2014, 7:26 AM

## 2014-08-12 NOTE — Progress Notes (Signed)
[facility] PROGRESS NOTE  [name] [name] AVW:[id number] DOB: 05-Feb-1951 DOA: 08/11/2014 PCP: [name], MD  Assessment/Plan: #1 failed right total hip arthroplasty Status post right total hip arthroplasty revision 08/11/2014. Per primary team.  #2 poor urine output Likely secondary to volume depletion secondary to poor oral intake prior to surgery. Improvement in urine output with gentle hydration. Continue to hold Lasix today. Follow.  #3 hypertension Continue Cozaar and HCTZ. Will add low-dose Norvasc.  #4 hypothyroidism Continue home dose Synthroid.  #5 diabetes mellitus Hemoglobin A1c is 6.9. CBGs have ranged from 123 -192. Continue current regimen of 70/30. Sliding scale insulin.  #6 obstructive sleep apnea Patient stated unable to tolerate full facemask at home and that's why she doesn't use her CPAP. Will place on CPAP during this hospitalization with a nasal mask.  #7 chronic kidney disease stage III Stable. Continue to hold Lasix. Follow. Monitor for fluid overload.  #8 tobacco abuse Tobacco cessation.  #9 history of TIAs Patient on xarelto for DVT prophylaxis.Once DVT prophylaxis is done patient will be placed back on Plavix.  Code Status: full Family Communication: updated patient and husband at bedside. Disposition Plan: per primary team.   Consultants:  [facility] hospitalist: Dr. Roel Cluck 08/11/2014  Procedures:  Right total hip arthroplasty revision 08/11/2014 Dr.Aluisio  Antibiotics:  none  HPI/Subjective: Patient with no complaints. Patient states voiding well.  Objective: Filed Vitals:   08/12/14 1125  BP: 149/61  Pulse: 96  Temp: 97.7 F (36.5 C)  Resp:     Intake/Output Summary (Last 24 hours) at 08/12/14 1348 Last data filed at 08/12/14 1300  Gross per 24 hour  Intake   2495 ml  Output   1775 ml  Net    720 ml   Filed Weights   08/11/14 1311  Weight: 140.615 kg (310 lb)    Exam:   General:   NAD  Cardiovascular: RRR with no murmurs rubs or gallops  Respiratory: clear to auscultation bilaterally.  Abdomen: soft, nontender, nondistended, positive bowel sounds.  Musculoskeletal: no clubbing cyanosis or edema  Data Reviewed: Basic Metabolic Panel:  Recent Labs Lab 08/11/14 2112 08/12/14 0448  NA 139 139  K 4.1 4.1  CL 100 101  CO2 25 25  GLUCOSE 180* 177*  BUN 43* 42*  CREATININE 1.56* 1.52*  CALCIUM 9.5 9.2   Liver Function Tests:  Recent Labs Lab 08/11/14 2112  AST 31  ALT 20  ALKPHOS 90  BILITOT <0.2*  PROT 7.7  ALBUMIN 3.3*   No results for input(s): LIPASE, AMYLASE in the last 168 hours. No results for input(s): AMMONIA in the last 168 hours. CBC:  Recent Labs Lab 08/11/14 2112 08/12/14 0448  WBC 18.9* 12.0*  NEUTROABS 16.4*  --   HGB 11.1* 10.0*  HCT 36.6 33.5*  MCV 86.9 87.0  PLT 307 313   Cardiac Enzymes: No results for input(s): CKTOTAL, CKMB, CKMBINDEX, TROPONINI in the last 168 hours. BNP (last 3 results)  Recent Labs  08/11/14 2113  PROBNP 449.3*   CBG:  Recent Labs Lab 08/11/14 1855 08/11/14 2207 08/12/14 0514 08/12/14 0728 08/12/14 1203  GLUCAP 170* 158* 192* 161* 123*    Recent Results (from the past 240 hour(s))  Surgical pcr screen     Status: None   Collection Time: 08/04/14  4:30 PM  Result Value Ref Range Status   MRSA, PCR NEGATIVE NEGATIVE Final   Staphylococcus aureus NEGATIVE NEGATIVE Final    Comment:        The  Xpert SA Assay (FDA approved for NASAL specimens in patients over 44 years of age), is one component of a comprehensive surveillance program.  Test performance has been validated by [employer] for patients greater than or equal to 3 year old. It is not intended to diagnose infection nor to guide or monitor treatment.     Studies: Dg Pelvis Portable  08/11/2014   CLINICAL DATA:  Subsequent encounter for postop right hip surgery today.  EXAM: PORTABLE PELVIS 1-2 VIEWS   COMPARISON:  02/09/2013.  FINDINGS: Frontal pelvis shows interval revision of the right total hip arthroplasty. Fine detail is limited by portable technique. No gross hardware complication at this time.  IMPRESSION: Status post right total hip revision without evidence for complicating features.   Electronically Signed   By: Misty Stanley M.D.   On: 08/11/2014 18:38    Scheduled Meds: . acetaminophen  1,000 mg Oral 4 times per day  . allopurinol  100 mg Oral Daily  . budesonide-formoterol  2 puff Inhalation BID  . darifenacin  15 mg Oral Daily  . docusate sodium  100 mg Oral BID  . esomeprazole  40 mg Oral q morning - 10a  . gemfibrozil  600 mg Oral BID AC  . hydrochlorothiazide  12.5 mg Oral Daily  . insulin aspart  0-15 Units Subcutaneous TID WC  . insulin aspart protamine- aspart  30 Units Subcutaneous Q supper  . insulin aspart protamine- aspart  40 Units Subcutaneous Q breakfast  . levothyroxine  137 mcg Oral QAC breakfast  . losartan  100 mg Oral Daily  . potassium chloride SA  20 mEq Oral QHS  . rivaroxaban  10 mg Oral Q breakfast  . tiZANidine  4 mg Oral BID   Continuous Infusions: . sodium chloride 50 mL/hr at 08/11/14 2146    Principal Problem:   Failed total hip arthroplasty Active Problems:   Tobacco abuse   DM (diabetes mellitus)   CKD (chronic kidney disease), stage III   Morbid obesity   HTN (hypertension)   Low urine output   Chronic systolic CHF (congestive heart failure)   Asthma, chronic   Hypothyroidism   OSA (obstructive sleep apnea)    Time spent: 5 minutes    Elgie Landino M.D. [facility] Pager [phone]. If 7PM-7AM, please contact night-coverage at [email or web address], password [email or web address] 08/12/2014, 1:48 PM  LOS: 1 day

## 2014-08-12 NOTE — Discharge Instructions (Addendum)
Dr. Gaynelle Arabian Total Joint Specialist [facility] [address], [place], [place] [address] [phone]   TOTAL HIP REPLACEMENT POSTOPERATIVE DIRECTIONS    Hip Rehabilitation, Guidelines Following Surgery  The results of a hip operation are greatly improved after range of motion and muscle strengthening exercises. Follow all safety measures which are given to protect your hip. If any of these exercises cause increased pain or swelling in your joint, decrease the amount until you are comfortable again. Then slowly increase the exercises. Call your caregiver if you have problems or questions.  HOME CARE INSTRUCTIONS  Most of the following instructions are designed to prevent the dislocation of your new hip.  Remove items at home which could result in a fall. This includes throw rugs or furniture in walking pathways.  Continue medications as instructed at time of discharge.  You may have some home medications which will be placed on hold until you complete the course of blood thinner medication.  You may start showering once you are discharged home but do not submerge the incision under water. Just pat the incision dry and apply a dry gauze dressing on daily. Do not put on socks or shoes without following the instructions of your caregivers.  Sit on high chairs so your hips are not bent more than 90 degrees.  Sit on chairs with arms. Use the chair arms to help push yourself up when arising.  Keep your leg on the side of the operation out in front of you when standing up.  Arrange for the use of a toilet seat elevator so you are not sitting low.  Do not do any exercises or get in any positions that cause your toes to point in (pigeon toed).  Always sleep with a pillow between your legs. Do not lie on your side in sleep with both knees touching the bed.   Walk with walker as instructed.  You may resume a sexual relationship in one month or when given the OK by  your caregiver.  Use walker as long as suggested by your caregivers.  You may put full weight on your legs and walk as much as is comfortable. Avoid periods of inactivity such as sitting longer than an hour when not asleep. This helps prevent blood clots.  You may return to work once you are cleared by [employer], production.  Do not drive a car for 6 weeks or until released by your surgeon.  Do not drive while taking narcotics.  Wear elastic stockings for three weeks following surgery during the day but you may remove then at night.  Make sure you keep all of your appointments after your operation with all of your doctors and caregivers. You should call the office at the above phone number and make an appointment for approximately two weeks after the date of your surgery. Change the dressing daily and reapply a dry dressing each time. Please pick up a stool softener and laxative for home use as long as you are requiring pain medications.  Continue to use ice on the hip for pain and swelling from surgery. You may notice swelling that will progress down to the foot and ankle.  This is normal after  surgery.  Elevate the leg when you are not up walking on it.   It is important for you to complete the blood thinner medication as prescribed by your doctor.  Continue to use the breathing machine which will help keep your temperature down.  It  is common for your temperature to cycle up and down following surgery, especially at night when you are not up moving around and exerting yourself.  The breathing machine keeps your lungs expanded and your temperature down.  RANGE OF MOTION AND STRENGTHENING EXERCISES  These exercises are designed to help you keep full movement of your hip joint. Follow your caregiver's or physical therapist's instructions. Perform all exercises about fifteen times, three times per day or as directed. Exercise both hips, even if you have had only one joint replacement. These exercises can be  done on a training (exercise) mat, on the floor, on a table or on a bed. Use whatever works the best and is most comfortable for you. Use music or television while you are exercising so that the exercises are a pleasant break in your day. This will make your life better with the exercises acting as a break in routine you can look forward to.  Lying on your back, slowly slide your foot toward your buttocks, raising your knee up off the floor. Then slowly slide your foot back down until your leg is straight again.  Lying on your back spread your legs as far apart as you can without causing discomfort.  Lying on your side, raise your upper leg and foot straight up from the floor as far as is comfortable. Slowly lower the leg and repeat.  Lying on your back, tighten up the muscle in the front of your thigh (quadriceps muscles). You can do this by keeping your leg straight and trying to raise your heel off the floor. This helps strengthen the largest muscle supporting your knee.  Lying on your back, tighten up the muscles of your buttocks both with the legs straight and with the knee bent at a comfortable angle while keeping your heel on the floor.   SKILLED REHAB INSTRUCTIONS: If the patient is transferred to a skilled rehab facility following release from the hospital, a list of the current medications will be sent to the facility for the patient to continue.  When discharged from the skilled rehab facility, please have the facility set up the patient's [place] prior to being released. Also, the skilled facility will be responsible for providing the patient with their medications at time of release from the facility to include their pain medication, the muscle relaxants, and their blood thinner medication. If the patient is still at the rehab facility at time of the two week follow up appointment, the skilled rehab facility will also need to assist the patient in arranging follow up  appointment in our office and any transportation needs.  MAKE SURE YOU:  Understand these instructions.  Will watch your condition.  Will get help right away if you are not doing well or get worse.  Pick up stool softner and laxative for home. Do not submerge incision under water. May shower. Continue to use ice for pain and swelling from surgery. Hip precautions.  Total Hip Protocol.  Take Xarelto for eight more days, then discontinue Xarelto. Once the patient has completed the Xarelto after eight days, they may resume the Plavix 75 mg daily at home.  Postoperative Constipation Protocol  Constipation - defined medically as fewer than three stools per week and severe constipation as less than one stool per week.  One of the most common issues patients have following surgery is constipation.  Even if you have a regular bowel pattern at home, your normal regimen is likely to be  disrupted due to multiple reasons following surgery.  Combination of anesthesia, postoperative narcotics, change in appetite and fluid intake all can affect your bowels.  In order to avoid complications following surgery, here are some recommendations in order to help you during your recovery period.  Colace (docusate) - Pick up an over-the-counter form of Colace or another stool softener and take twice a day as long as you are requiring postoperative pain medications.  Take with a full glass of water daily.  If you experience loose stools or diarrhea, hold the colace until you stool forms back up.  If your symptoms do not get better within 1 week or if they get worse, check with your doctor.  Dulcolax (bisacodyl) - Pick up over-the-counter and take as directed by the product packaging as needed to assist with the movement of your bowels.  Take with a full glass of water.  Use this product as needed if not relieved by Colace only.   MiraLax (polyethylene glycol) - Pick up over-the-counter to have on hand.  MiraLax is  a solution that will increase the amount of water in your bowels to assist with bowel movements.  Take as directed and can mix with a glass of water, juice, soda, coffee, or tea.  Take if you go more than two days without a movement. Do not use MiraLax more than once per day. Call your doctor if you are still constipated or irregular after using this medication for 7 days in a row.  If you continue to have problems with postoperative constipation, please contact the office for further assistance and recommendations.  If you experience "the worst abdominal pain ever" or develop nausea or vomiting, please contact the office immediatly for further recommendations for treatment.    Information on my medicine - XARELTO (Rivaroxaban)  This medication education was reviewed with me or my healthcare representative as part of my discharge preparation.  The pharmacist that spoke with me during my hospital stay was:  Absher, Julieta Bellini, RPH  Why was Xarelto prescribed for you? Xarelto was prescribed for you to reduce the risk of blood clots forming after orthopedic surgery. The medical term for these abnormal blood clots is venous thromboembolism (VTE).  What do you need to know about xarelto ? Take your Xarelto ONCE DAILY at the same time every day. You may take it either with or without food.  If you have difficulty swallowing the tablet whole, you may crush it and mix in applesauce just prior to taking your dose.  Take Xarelto exactly as prescribed by your doctor and DO NOT stop taking Xarelto without talking to the doctor who prescribed the medication.  Stopping without other VTE prevention medication to take the place of Xarelto may increase your risk of developing a clot.  After discharge, you should have regular check-up appointments with your healthcare provider that is prescribing your Xarelto.    What do you do if you miss a dose? If you miss a dose, take it as soon as you remember on  the same day then continue your regularly scheduled once daily regimen the next day. Do not take two doses of Xarelto on the same day.   Important Safety Information A possible side effect of Xarelto is bleeding. You should call your healthcare provider right away if you experience any of the following: ? Bleeding from an injury or your nose that does not stop. ? Unusual colored urine (red or dark brown) or unusual colored stools (red  or black). ? Unusual bruising for unknown reasons. ? A serious fall or if you hit your head (even if there is no bleeding).  Some medicines may interact with Xarelto and might increase your risk of bleeding while on Xarelto. To help avoid this, consult your healthcare provider or pharmacist prior to using any new prescription or non-prescription medications, including herbals, vitamins, non-steroidal anti-inflammatory drugs (NSAIDs) and supplements.  This website has more information on Xarelto: [email or web address]

## 2014-08-13 DIAGNOSIS — R34 Anuria and oliguria: Secondary | ICD-10-CM

## 2014-08-13 LAB — CBC
HCT: 28.7 % — ABNORMAL LOW (ref 36.0–46.0)
Hemoglobin: 8.8 g/dL — ABNORMAL LOW (ref 12.0–15.0)
MCH: 26.3 pg (ref 26.0–34.0)
MCHC: 30.7 g/dL (ref 30.0–36.0)
MCV: 85.7 fL (ref 78.0–100.0)
Platelets: 283 10*3/uL (ref 150–400)
RBC: 3.35 MIL/uL — ABNORMAL LOW (ref 3.87–5.11)
RDW: 15.8 % — ABNORMAL HIGH (ref 11.5–15.5)
WBC: 11 10*3/uL — ABNORMAL HIGH (ref 4.0–10.5)

## 2014-08-13 LAB — GLUCOSE, CAPILLARY
Glucose-Capillary: 250 mg/dL — ABNORMAL HIGH (ref 70–99)
Glucose-Capillary: 158 mg/dL — ABNORMAL HIGH (ref 70–99)
Glucose-Capillary: 227 mg/dL — ABNORMAL HIGH (ref 70–99)
Glucose-Capillary: 146 mg/dL — ABNORMAL HIGH (ref 70–99)

## 2014-08-13 LAB — BASIC METABOLIC PANEL
Anion gap: 13 (ref 5–15)
BUN: 38 mg/dL — ABNORMAL HIGH (ref 6–23)
CO2: 26 mEq/L (ref 19–32)
Calcium: 9.1 mg/dL (ref 8.4–10.5)
Chloride: 102 mEq/L (ref 96–112)
Creatinine, Ser: 1.36 mg/dL — ABNORMAL HIGH (ref 0.50–1.10)
GFR calc Af Amer: 47 mL/min — ABNORMAL LOW (ref >90)
GFR calc non Af Amer: 40 mL/min — ABNORMAL LOW (ref >90)
Glucose, Bld: 188 mg/dL — ABNORMAL HIGH (ref 70–99)
Potassium: 3.7 mEq/L (ref 3.7–5.3)
Sodium: 141 mEq/L (ref 137–147)

## 2014-08-13 MED ORDER — TRAMADOL HCL 50 MG PO TABS: 50.0000 mg | ORAL_TABLET | Freq: Four times a day (QID) | ORAL | Status: DC | PRN

## 2014-08-13 MED ORDER — OXYCODONE HCL 5 MG PO TABS: 5.0000 mg | ORAL_TABLET | ORAL | Status: DC | PRN

## 2014-08-13 MED ORDER — METHOCARBAMOL 500 MG PO TABS: 500.0000 mg | ORAL_TABLET | Freq: Four times a day (QID) | ORAL | Status: DC | PRN

## 2014-08-13 MED ORDER — AMLODIPINE BESYLATE 5 MG PO TABS
5.0000 mg | ORAL_TABLET | Freq: Every day | ORAL | Status: DC
  Administered 2014-08-13: 5 mg via ORAL
  Filled 2014-08-13: qty 1

## 2014-08-13 MED ORDER — RIVAROXABAN 10 MG PO TABS: 10.0000 mg | ORAL_TABLET | Freq: Every day | ORAL | Status: DC

## 2014-08-13 NOTE — Progress Notes (Signed)
Physical Therapy Treatment Patient Details Name: [name] [name] MRN: [id number] DOB: 04/28/1951 Today's Date: 2014-08-26    History of Present Illness R THR revision    PT Comments    Pt progressing well.  Reviewed stairs, car transfers and post THP.  Follow Up Recommendations  Home health PT     Equipment Recommendations  Rolling walker with 5" wheels    Recommendations for Other Services OT consult     Precautions / Restrictions Precautions Precautions: Posterior Hip;Fall Precaution Booklet Issued: Yes (comment) Precaution Comments: all THP reviewed Restrictions Weight Bearing Restrictions: No Other Position/Activity Restrictions: WBAT    Mobility  Bed Mobility           Sit to supine: Min assist   General bed mobility comments: Pt states reviewed with OT  Transfers Overall transfer level: Needs assistance Equipment used: Rolling walker (2 wheeled) Transfers: Sit to/from Stand Sit to Stand: Min guard;Supervision Stand pivot transfers: Min guard;Supervision       General transfer comment: min cues for LE management and use of UEs to self assist  Ambulation/Gait Ambulation/Gait assistance: Min guard;Supervision Ambulation Distance (Feet): 80 Feet Assistive device: Rolling walker (2 wheeled) Gait Pattern/deviations: Step-to pattern;Decreased step length - right;Decreased step length - left;Shuffle;Trunk flexed;Antalgic Gait velocity: decr   General Gait Details: cues for sequence, posture, position from RW   Stairs Stairs: Yes Stairs assistance: Min assist Stair Management: One rail Right;Step to pattern;Forwards;With crutches Number of Stairs: 3 General stair comments: cues for sequence and crutch/foot placement  Wheelchair Mobility    Modified Rankin (Stroke Patients Only)       Balance                                    Cognition Arousal/Alertness: Awake/alert Behavior During Therapy: WFL for tasks  assessed/performed Overall Cognitive Status: Within Functional Limits for tasks assessed                      Exercises      General Comments        Pertinent Vitals/Pain Pain Assessment: 0-10 Pain Score: 5  Pain Location: R hip Pain Descriptors / Indicators: Aching;Sore Pain Intervention(s): Limited activity within patient's tolerance;Monitored during session;Premedicated before session;Ice applied    Home Living                      Prior Function            PT Goals (current goals can now be found in the care plan section) Acute Rehab PT Goals Patient Stated Goal: Resume previous lifestyle with decreased pain PT Goal Formulation: With patient Time For Goal Achievement: 08/19/14 Potential to Achieve Goals: Good Progress towards PT goals: Progressing toward goals    Frequency  7X/week    PT Plan Current plan remains appropriate    Co-evaluation             End of Session   Activity Tolerance: Patient tolerated treatment well Patient left: in chair;with call bell/phone within reach     Time: 1143-1208 PT Time Calculation (min): 25 min  Charges:  $Gait Training: 8-22 mins $Therapeutic Exercise: 8-22 mins                    G Codes:      Raheel Kunkle 2014-08-26, 12:55 PM

## 2014-08-13 NOTE — Progress Notes (Signed)
Occupational Therapy Treatment Patient Details Name: [name] [name] MRN: [id number] DOB: 1951-03-13 Today's Date: 08/13/2014    History of present illness R THR revision   OT comments  Performed ADL and reviewed THPs/AE  Follow Up Recommendations  Home health OT;Supervision/Assistance - 24 hour    Equipment Recommendations  3 in 1 bedside comode    Recommendations for Other Services      Precautions / Restrictions Precautions Precautions: Posterior Hip;Fall Precaution Booklet Issued: Yes (comment) Precaution Comments: all THP reviewed Restrictions Weight Bearing Restrictions: No Other Position/Activity Restrictions: WBAT       Mobility Bed Mobility           Sit to supine: Min assist   General bed mobility comments: Pt states reviewed with OT  Transfers Overall transfer level: Needs assistance Equipment used: Rolling walker (2 wheeled) Transfers: Sit to/from Stand Sit to Stand: Min guard;Supervision Stand pivot transfers: Min guard;Supervision       General transfer comment: cues for LE placement    Balance                                   ADL                       Lower Body Dressing: Minimal assistance;With adaptive equipment;Adhering to hip precautions;Sit to/from stand   Toilet Transfer: Minimal assistance;BSC;Stand-pivot   Toileting- Clothing Manipulation and Hygiene: Maximal assistance;Sit to/from stand         General ADL Comments: Pt completed dressing with AE.  Cues for sock aide for THPs and assist to pull pants and underwear over hips due to tightness.  Pt requires rest breaks as she gets SOB.      Vision                     Perception     Praxis      Cognition   Behavior During Therapy: WFL for tasks assessed/performed Overall Cognitive Status: Within Functional Limits for tasks assessed                       Extremity/Trunk Assessment               Exercises      Shoulder Instructions       General Comments      Pertinent Vitals/ Pain       Pain Assessment: 0-10 Pain Score: 3  Pain Location: R hip Pain Descriptors / Indicators: Sore Pain Intervention(s): Limited activity within patient's tolerance;Monitored during session;Premedicated before session;Repositioned  Home Living Family/patient expects to be discharged to:: Private residence                                        Prior Functioning/Environment              Frequency Min 2X/week     Progress Toward Goals  OT Goals(current goals can now be found in the care plan section)  Progress towards OT goals: Progressing toward goals  Acute Rehab OT Goals Patient Stated Goal: Resume previous lifestyle with decreased pain  Plan      Co-evaluation                 End of Session     Activity Tolerance Patient  tolerated treatment well   Patient Left in chair;with call bell/phone within reach   Nurse Communication          Time: [id number] OT Time Calculation (min): 28 min  Charges: OT General Charges $OT Visit: 1 Procedure OT Treatments $Self Care/Home Management : 23-37 mins  Krish Bailly 08/13/2014, 1:48 PM  [name], OTR/L [phone] 08/13/2014

## 2014-08-13 NOTE — Progress Notes (Signed)
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 Per primary team.  #2 poor urine output Likely secondary to volume depletion secondary to poor oral intake prior to surgery. Improvement in urine output with gentle hydration. Resume lasix on discharge. Follow.  #3 hypertension Continue Cozaar and HCTZ. Resume lasix on discharge. Outpatient follow up.  #4 hypothyroidism Continue home dose Synthroid.  #5 diabetes mellitus Hemoglobin A1c is 6.9. CBGs have ranged from 158 -250. Continue current regimen of 70/30. Sliding scale insulin.Outpatient follow up.  #6 obstructive sleep apnea Patient stated unable to tolerate full facemask at home and that's why she doesn't use her CPAP. Patient refused CPAP last night. Outpatient follow up.  #7 chronic kidney disease stage III Stable. May resume lasix on discharge. Follow. Monitor for fluid overload.  #8 tobacco abuse Tobacco cessation.  #9 history of TIAs Patient on xarelto for DVT prophylaxis.Once DVT prophylaxis is done patient will be placed back on Plavix.  Code Status: full Family Communication: updated patient, no family at bedside. Disposition Plan: ok to d/c from medicine standpoint.   Consultants:  [facility] hospital[facility]: Dr. Roel Cluck 08/11/2014  Procedures:  Right total hip arthroplasty revision 08/11/2014 Dr.Aluisio  Antibiotics:  none  HPI/Subjective: Patient with no complaints. Patient with good urine output.  Objective: Filed Vitals:   08/13/14 0500  BP: 153/66  Pulse: 77  Temp: 99.6 F (37.6 C)  Resp: 18    Intake/Output Summary (Last 24 hours) at 08/13/14 0857 Last data filed at 08/13/14 0550  Gross per 24 hour  Intake    480 ml  Output   2150 ml  Net  -1670 ml   Filed Weights   08/11/14 1311 08/12/14 1515  08/13/14 0735  Weight: 140.615 kg (310 lb) 146.1 kg (322 lb 1.5 oz) 141.2 kg (311 lb 4.6 oz)    Exam:   General:  NAD  Cardiovascular: RRR with no murmurs rubs or gallops  Respiratory: clear to auscultation bilaterally.  Abdomen: soft, nontender, nondistended, positive bowel sounds.  Musculoskeletal: no clubbing cyanosis or edema  Data Reviewed: Basic Metabolic Panel:  Recent Labs Lab 08/11/14 2112 08/12/14 0448 08/13/14 0500  NA 139 139 141  K 4.1 4.1 3.7  CL 100 101 102  CO2 25 25 26   GLUCOSE 180* 177* 188*  BUN 43* 42* 38*  CREATININE 1.56* 1.52* 1.36*  CALCIUM 9.5 9.2 9.1   Liver Function Tests:  Recent Labs Lab 08/11/14 2112  AST 31  ALT 20  ALKPHOS 90  BILITOT <0.2*  PROT 7.7  ALBUMIN 3.3*   No results for input(s): LIPASE, AMYLASE in the last 168 hours. No results for input(s): AMMONIA in the last 168 hours. CBC:  Recent Labs Lab 08/11/14 2112 08/12/14 0448 08/13/14 0500  WBC 18.9* 12.0* 11.0*  NEUTROABS 16.4*  --   --   HGB 11.1* 10.0* 8.8*  HCT 36.6 33.5* 28.7*  MCV 86.9 87.0 85.7  PLT 307 313 283   Cardiac Enzymes: No results for input(s): CKTOTAL, CKMB, CKMBINDEX, TROPONINI in the last 168 hours. BNP (last 3 results)  Recent Labs  08/11/14 2113  PROBNP 449.3*   CBG:  Recent Labs Lab 08/12/14 0728 08/12/14 1203 08/12/14 1715 08/12/14 2132 08/13/14 0730  GLUCAP 161* 123* 205* 250* 158*    Recent Results (from the past 240 hour(s))  Surgical pcr screen     Status:  None   Collection Time: 08/04/14  4:30 PM  Result Value Ref Range Status   MRSA, PCR NEGATIVE NEGATIVE Final   Staphylococcus aureus NEGATIVE NEGATIVE Final    Comment:        The Xpert SA Assay (FDA approved for NASAL specimens in patients over 31 years of age), is one component of a comprehensive surveillance program.  Test performance has been validated by [employer] for patients greater than or equal to 44 year old. It is not intended to  diagnose infection nor to guide or monitor treatment.     Studies: Dg Pelvis Portable  08/11/2014   CLINICAL DATA:  Subsequent encounter for postop right hip surgery today.  EXAM: PORTABLE PELVIS 1-2 VIEWS  COMPARISON:  02/09/2013.  FINDINGS: Frontal pelvis shows interval revision of the right total hip arthroplasty. Fine detail is limited by portable technique. No gross hardware complication at this time.  IMPRESSION: Status post right total hip revision without evidence for complicating features.   Electronically Signed   By: Misty Stanley M.D.   On: 08/11/2014 18:38    Scheduled Meds: . allopurinol  100 mg Oral Daily  . amLODipine  5 mg Oral Daily  . budesonide-formoterol  2 puff Inhalation BID  . darifenacin  15 mg Oral Daily  . docusate sodium  100 mg Oral BID  . esomeprazole  40 mg Oral q morning - 10a  . gemfibrozil  600 mg Oral BID AC  . hydrochlorothiazide  12.5 mg Oral Daily  . insulin aspart  0-15 Units Subcutaneous TID WC  . insulin aspart protamine- aspart  30 Units Subcutaneous Q supper  . insulin aspart protamine- aspart  40 Units Subcutaneous Q breakfast  . levothyroxine  137 mcg Oral QAC breakfast  . losartan  100 mg Oral Daily  . potassium chloride SA  20 mEq Oral QHS  . rivaroxaban  10 mg Oral Q breakfast  . tiZANidine  4 mg Oral BID   Continuous Infusions: . sodium chloride 50 mL/hr at 08/11/14 2146    Principal Problem:   Failed total hip arthroplasty Active Problems:   Tobacco abuse   DM (diabetes mellitus)   CKD (chronic kidney disease), stage III   Morbid obesity   HTN (hypertension)   Low urine output   Chronic systolic CHF (congestive heart failure)   Asthma, chronic   Hypothyroidism   OSA (obstructive sleep apnea)    Time spent: 106 minutes    Sabrinna Yearwood M.D. [facility] Pager [phone]. If 7PM-7AM, please contact night-coverage at [email or web address], password [facility] 08/13/2014, 8:57 AM  LOS: 2 days

## 2014-08-13 NOTE — Progress Notes (Signed)
Occupational Therapy Treatment Patient Details Name: RAHI CHANDONNET MRN: [id number] DOB: 10/03/1951 Today's Date: 08/13/2014    History of present illness R THR revision   OT comments  Continues to need min cues for THPs  Follow Up Recommendations  Home health OT;Supervision/Assistance - 24 hour    Equipment Recommendations  3 in 1 bedside comode    Recommendations for Other Services      Precautions / Restrictions Precautions Precautions: Posterior Hip;Fall Precaution Booklet Issued: Yes (comment) Restrictions Weight Bearing Restrictions: No Other Position/Activity Restrictions: WBAT       Mobility Bed Mobility           Sit to supine: Min assist   General bed mobility comments: with leg lifter.  Cues for THPs when scooting forward  Transfers   Equipment used: Rolling walker (2 wheeled) Transfers: Sit to/from Stand Sit to Stand: Min guard         General transfer comment: cues for LE placement    Balance                                   ADL                           Toilet Transfer: Minimal assistance;BSC;Stand-pivot   Toileting- Clothing Manipulation and Hygiene: Maximal assistance;Sit to/from stand         General ADL Comments: Pt ambulated around bed with min guard to recliner after using commode.  She does not have clothing here yet so I will return later to have to practice with AE.  Pt used leg lifter to get OOB--cues for THPs when she was scooting to EOB.  Also demonstrated use of sheet in place of leg lifter      Vision                     Perception     Praxis      Cognition   Behavior During Therapy: WFL for tasks assessed/performed Overall Cognitive Status: Within Functional Limits for tasks assessed                       Extremity/Trunk Assessment               Exercises     Shoulder Instructions       General Comments      Pertinent Vitals/ Pain       Pain Score:  6  Pain Location: R hip Pain Descriptors / Indicators: Aching Pain Intervention(s): Limited activity within patient's tolerance;Monitored during session;Premedicated before session;Repositioned;Ice applied  Home Living                                          Prior Functioning/Environment              Frequency Min 2X/week     Progress Toward Goals  OT Goals(current goals can now be found in the care plan section)  Progress towards OT goals: Progressing toward goals     Plan      Co-evaluation                 End of Session     Activity Tolerance Patient limited by pain   Patient Left in  chair;with call bell/phone within reach   Nurse Communication          Time: [phone] OT Time Calculation (min): 28 min  Charges: OT General Charges $OT Visit: 1 Procedure OT Treatments $Self Care/Home Management : 23-37 mins  Nelline Lio 08/13/2014, 10:06 AM  [name], OTR/L [phone] 08/13/2014

## 2014-08-13 NOTE — Progress Notes (Signed)
Subjective: 2 Days Post-Op Procedure(s) (LRB): RIGHT ACETABULAR REVISION (Right) Patient reports pain as mild and moderate.   Patient seen in rounds with Dr. Wynelle Link. Patient is well, but has had some minor complaints of pain in the hip, requiring pain medications.  The deep pain has improved since surgery. Patient is ready to go home  Objective: Vital signs in last 24 hours: Temp:  [97.7 F (36.5 C)-99.6 F (37.6 C)] 99.6 F (37.6 C) (11/06 0500) Pulse Rate:  [73-96] 77 (11/06 0500) Resp:  [18] 18 (11/06 0500) BP: (139-160)/(57-69) 153/66 mmHg (11/06 0500) SpO2:  [95 %-100 %] 98 % (11/06 0500) Weight:  [146.1 kg (322 lb 1.5 oz)] 146.1 kg (322 lb 1.5 oz) (11/05 1515)  Intake/Output from previous day:  Intake/Output Summary (Last 24 hours) at 08/13/14 0716 Last data filed at 08/13/14 0550  Gross per 24 hour  Intake    480 ml  Output   2150 ml  Net  -1670 ml     Labs:  Recent Labs  08/11/14 2112 08/12/14 0448 08/13/14 0500  HGB 11.1* 10.0* 8.8*    Recent Labs  08/12/14 0448 08/13/14 0500  WBC 12.0* 11.0*  RBC 3.85* 3.35*  HCT 33.5* 28.7*  PLT 313 283    Recent Labs  08/12/14 0448 08/13/14 0500  NA 139 141  K 4.1 3.7  CL 101 102  CO2 25 26  BUN 42* 38*  CREATININE 1.52* 1.36*  GLUCOSE 177* 188*  CALCIUM 9.2 9.1   No results for input(s): LABPT, INR in the last 72 hours.  EXAM: General - Patient is Alert, Appropriate and Oriented Extremity - Neurovascular intact Sensation intact distally Incision - clean, dry, no drainage Motor Function - intact, moving foot and toes well on exam.   Assessment/Plan: 2 Days Post-Op Procedure(s) (LRB): RIGHT ACETABULAR REVISION (Right) Procedure(s) (LRB): RIGHT ACETABULAR REVISION (Right) Past Medical History  Diagnosis Date  . History of hip replacement, total   . Hx MRSA infection     abscess left groin  . Arthritis   . GERD (gastroesophageal reflux disease)   . Gout   . Diabetes mellitus   .  Hypertension   . Thyroid disease   . Hyperlipidemia   . TIA (transient ischemic attack)      X2 NO RESIDUAL PROBLEMS  . Positive cardiac stress test 12/2013    anterior and lateral ischemia on Myoview  . Hx of cardiac cath 06/2014  . Asthma   . Headache   . Loosening of prosthetic hip   . Cramping of feet   . Stress incontinence   . History of transfusion   . Sleep apnea     UNABLE TO TOLERATE C PAP   Principal Problem:   Failed total hip arthroplasty Active Problems:   Tobacco abuse   DM (diabetes mellitus)   CKD (chronic kidney disease), stage III   Morbid obesity   HTN (hypertension)   Low urine output   Chronic systolic CHF (congestive heart failure)   Asthma, chronic   Hypothyroidism   OSA (obstructive sleep apnea)  Estimated body mass index is 55.26 kg/(m^2) as calculated from the following:   Height as of this encounter: 5\' 4"  (1.626 m).   Weight as of this encounter: 146.1 kg (322 lb 1.5 oz). Up with therapy Discharge home with home health Diet - Cardiac diet, Diabetic diet and Renal diet Follow up - in 2 weeks Activity - WBAT Disposition - Home Condition Upon Discharge - improving D/C Meds -  See DC Summary DVT Prophylaxis - Xarelto  Arlee Muslim, PA-C Orthopaedic Surgery 08/13/2014, 7:16 AM

## 2014-08-13 NOTE — Plan of Care (Signed)
Problem: Consults Goal: Total Joint Replacement Patient Education See Patient Education Module for education specifics. Outcome: Completed/Met Date Met:  08/13/14 Goal: Diagnosis- Total Joint Replacement Outcome: Completed/Met Date Met:  08/13/14 Revision Total Hip RIGHT Goal: Skin Care Protocol Initiated - if Braden Score 18 or less If consults are not indicated, leave blank or document N/A Outcome: Not Applicable Date Met:  93/01/23 Goal: Nutrition Consult-if indicated Outcome: Not Applicable Date Met:  79/90/94 Goal: Diabetes Guidelines if Diabetic/Glucose > 140 If diabetic or lab glucose is > 140 mg/dl - Initiate Diabetes/Hyperglycemia Guidelines & Document Interventions  Outcome: Completed/Met Date Met:  08/13/14  Problem: Phase I Progression Outcomes Goal: CMS/Neurovascular status WDL Outcome: Completed/Met Date Met:  08/13/14 Goal: Pain controlled with appropriate interventions Outcome: Completed/Met Date Met:  08/13/14 Goal: Dangle or out of bed evening of surgery Outcome: Completed/Met Date Met:  08/13/14 Goal: Initial discharge plan identified Outcome: Completed/Met Date Met:  08/13/14 Goal: Hemodynamically stable Outcome: Completed/Met Date Met:  08/13/14 Goal: Other Phase I Outcomes/Goals Outcome: Not Applicable Date Met:  00/05/05  Problem: Phase II Progression Outcomes Goal: Ambulates Outcome: Completed/Met Date Met:  08/13/14 Goal: Tolerating diet Outcome: Completed/Met Date Met:  08/13/14 Goal: Discharge plan established Outcome: Completed/Met Date Met:  08/13/14 Goal: Other Phase II Outcomes/Goals Outcome: Not Applicable Date Met:  67/88/93  Problem: Phase III Progression Outcomes Goal: Pain controlled on oral analgesia Outcome: Completed/Met Date Met:  08/13/14 Goal: Ambulates Outcome: Completed/Met Date Met:  08/13/14 Goal: Discharge plan remains appropriate-arrangements made Outcome: Completed/Met Date Met:  08/13/14 Goal: Anticoagulant follow-up in  place Outcome: Not Applicable Date Met:  38/82/66 Xarelto VTE no f/u needed. Goal: Other Phase III Outcomes/Goals Outcome: Not Applicable Date Met:  66/48/61  Problem: Discharge Progression Outcomes Goal: Barriers To Progression Addressed/Resolved Outcome: Not Applicable Date Met:  61/22/40 Goal: CMS/Neurovascular status at or above baseline Outcome: Completed/Met Date Met:  08/13/14 Goal: Anticoagulant follow-up in place Outcome: Not Applicable Date Met:  01/80/97 Goal: Pain controlled with appropriate interventions Outcome: Completed/Met Date Met:  08/13/14 Goal: Hemodynamically stable Outcome: Completed/Met Date Met:  04/49/25 Goal: Complications resolved/controlled Outcome: Completed/Met Date Met:  08/13/14 Goal: Tolerates diet Outcome: Completed/Met Date Met:  08/13/14 Goal: Activity appropriate for discharge plan Outcome: Completed/Met Date Met:  08/13/14

## 2014-08-13 NOTE — Discharge Summary (Signed)
Physician Discharge Summary   Patient ID: [name] [name] MRN: [id number] DOB/AGE: [birth year] 63 y.o.  Admit date: 08/11/2014 Discharge date: 08-13-2014  Primary Diagnosis:  Failed right total hip arthroplasty. Admission Diagnoses:  Past Medical History  Diagnosis Date  . History of hip replacement, total   . Hx MRSA infection     abscess left groin  . Arthritis   . GERD (gastroesophageal reflux disease)   . Gout   . Diabetes mellitus   . Hypertension   . Thyroid disease   . Hyperlipidemia   . TIA (transient ischemic attack)      X2 NO RESIDUAL PROBLEMS  . Positive cardiac stress test 12/2013    anterior and lateral ischemia on Myoview  . Hx of cardiac cath 06/2014  . Asthma   . Headache   . Loosening of prosthetic hip   . Cramping of feet   . Stress incontinence   . History of transfusion   . Sleep apnea     UNABLE TO TOLERATE C PAP   Discharge Diagnoses:   Principal Problem:   Failed total hip arthroplasty Active Problems:   Tobacco abuse   DM (diabetes mellitus)   CKD (chronic kidney disease), stage III   Morbid obesity   HTN (hypertension)   Low urine output   Chronic systolic CHF (congestive heart failure)   Asthma, chronic   Hypothyroidism   OSA (obstructive sleep apnea)  Estimated body mass index is 53.41 kg/(m^2) as calculated from the following:   Height as of this encounter: '5\' 4"'  (1.626 m).   Weight as of this encounter: 141.2 kg (311 lb 4.6 oz).  Procedure(s) (LRB): RIGHT ACETABULAR REVISION (Right)   Consults: Medicine Consult  HPI: [name] [name] is a 63 year old female with complex history in regards to her right hip. She had a right total hip arthroplasty done in the outside facility in 2011, with metal-on-metal. She had a reaction to the metal, eventually had a conversion to a polyethylene liner. She has had persistent pain since. I saw her in second opinion few months ago and noted that she had severe wear of the polyethylene and it  appeared that her acetabular component was excessively anteverted and abducted. It was felt that it was either a loose cup or just impingement leading to poly wear. She had significant pain and dysfunction and presents for acetabular versus total hip Revision.  Laboratory Data: Admission on 08/11/2014, Discharged on 08/13/2014  Component Date Value Ref Range Status  . Glucose-Capillary 08/11/2014 138* 70 - 99 mg/dL Final  . Glucose-Capillary 08/11/2014 170* 70 - 99 mg/dL Final  . WBC 08/12/2014 12.0* 4.0 - 10.5 K/uL Final  . RBC 08/12/2014 3.85* 3.87 - 5.11 MIL/uL Final  . Hemoglobin 08/12/2014 10.0* 12.0 - 15.0 g/dL Final  . HCT 08/12/2014 33.5* 36.0 - 46.0 % Final  . MCV 08/12/2014 87.0  78.0 - 100.0 fL Final  . MCH 08/12/2014 26.0  26.0 - 34.0 pg Final  . MCHC 08/12/2014 29.9* 30.0 - 36.0 g/dL Final  . RDW 08/12/2014 15.8* 11.5 - 15.5 % Final  . Platelets 08/12/2014 313  150 - 400 K/uL Final  . Sodium 08/12/2014 139  137 - 147 mEq/L Final  . Potassium 08/12/2014 4.1  3.7 - 5.3 mEq/L Final  . Chloride 08/12/2014 101  96 - 112 mEq/L Final  . CO2 08/12/2014 25  19 - 32 mEq/L Final  . Glucose, Bld 08/12/2014 177* 70 - 99 mg/dL Final  . BUN 08/12/2014 42* 6 -  23 mg/dL Final  . Creatinine, Ser 08/12/2014 1.52* 0.50 - 1.10 mg/dL Final  . Calcium 08/12/2014 9.2  8.4 - 10.5 mg/dL Final  . GFR calc non Af Amer 08/12/2014 35* >90 mL/min Final  . GFR calc Af Amer 08/12/2014 41* >90 mL/min Final   Comment: (NOTE) The eGFR has been calculated using the CKD EPI equation. This calculation has not been validated in all clinical situations. eGFR's persistently <90 mL/min signify possible Chronic Kidney Disease.   . Anion gap 08/12/2014 13  5 - 15 Final  . Sodium 08/11/2014 139  137 - 147 mEq/L Final  . Potassium 08/11/2014 4.1  3.7 - 5.3 mEq/L Final  . Chloride 08/11/2014 100  96 - 112 mEq/L Final  . CO2 08/11/2014 25  19 - 32 mEq/L Final  . Glucose, Bld 08/11/2014 180* 70 - 99 mg/dL  Final  . BUN 08/11/2014 43* 6 - 23 mg/dL Final  . Creatinine, Ser 08/11/2014 1.56* 0.50 - 1.10 mg/dL Final  . Calcium 08/11/2014 9.5  8.4 - 10.5 mg/dL Final  . Total Protein 08/11/2014 7.7  6.0 - 8.3 g/dL Final  . Albumin 08/11/2014 3.3* 3.5 - 5.2 g/dL Final  . AST 08/11/2014 31  0 - 37 U/L Final  . ALT 08/11/2014 20  0 - 35 U/L Final  . Alkaline Phosphatase 08/11/2014 90  39 - 117 U/L Final  . Total Bilirubin 08/11/2014 <0.2* 0.3 - 1.2 mg/dL Final  . GFR calc non Af Amer 08/11/2014 34* >90 mL/min Final  . GFR calc Af Amer 08/11/2014 40* >90 mL/min Final   Comment: (NOTE) The eGFR has been calculated using the CKD EPI equation. This calculation has not been validated in all clinical situations. eGFR's persistently <90 mL/min signify possible Chronic Kidney Disease.   . Anion gap 08/11/2014 14  5 - 15 Final  . WBC 08/11/2014 18.9* 4.0 - 10.5 K/uL Final  . RBC 08/11/2014 4.21  3.87 - 5.11 MIL/uL Final  . Hemoglobin 08/11/2014 11.1* 12.0 - 15.0 g/dL Final  . HCT 08/11/2014 36.6  36.0 - 46.0 % Final  . MCV 08/11/2014 86.9  78.0 - 100.0 fL Final  . MCH 08/11/2014 26.4  26.0 - 34.0 pg Final  . MCHC 08/11/2014 30.3  30.0 - 36.0 g/dL Final  . RDW 08/11/2014 15.7* 11.5 - 15.5 % Final  . Platelets 08/11/2014 307  150 - 400 K/uL Final  . Neutrophils Relative % 08/11/2014 87* 43 - 77 % Final  . Neutro Abs 08/11/2014 16.4* 1.7 - 7.7 K/uL Final  . Lymphocytes Relative 08/11/2014 8* 12 - 46 % Final  . Lymphs Abs 08/11/2014 1.6  0.7 - 4.0 K/uL Final  . Monocytes Relative 08/11/2014 5  3 - 12 % Final  . Monocytes Absolute 08/11/2014 0.9  0.1 - 1.0 K/uL Final  . Eosinophils Relative 08/11/2014 0  0 - 5 % Final  . Eosinophils Absolute 08/11/2014 0.0  0.0 - 0.7 K/uL Final  . Basophils Relative 08/11/2014 0  0 - 1 % Final  . Basophils Absolute 08/11/2014 0.0  0.0 - 0.1 K/uL Final  . Pro B Natriuretic peptide (BNP) 08/11/2014 449.3* 0 - 125 pg/mL Final  . Hgb A1c MFr Bld 08/11/2014 6.9* <5.7 % Final    Comment: (NOTE)  According to the ADA Clinical Practice Recommendations for 2011, when HbA1c is used as a screening test:  >=6.5%   Diagnostic of Diabetes Mellitus           (if abnormal result is confirmed) 5.7-6.4%   Increased risk of developing Diabetes Mellitus References:Diagnosis and Classification of Diabetes Mellitus,Diabetes LPFX,9024,09(BDZHG 1):S62-S69 and Standards of Medical Care in         Diabetes - 2011,Diabetes DJME,2683,41 (Suppl 1):S11-S61.   . Mean Plasma Glucose 08/11/2014 151* <117 mg/dL Final   Performed at [facility]  . TSH 08/12/2014 0.381  0.350 - 4.500 uIU/mL Final   Performed at [facility]  . Glucose-Capillary 08/11/2014 158* 70 - 99 mg/dL Final  . Comment 1 08/11/2014 Notify RN   Final  . Glucose-Capillary 08/12/2014 192* 70 - 99 mg/dL Final  . Comment 1 08/12/2014 Notify RN   Final  . Glucose-Capillary 08/12/2014 161* 70 - 99 mg/dL Final  . Glucose-Capillary 08/12/2014 123* 70 - 99 mg/dL Final  . WBC 08/13/2014 11.0* 4.0 - 10.5 K/uL Final  . RBC 08/13/2014 3.35* 3.87 - 5.11 MIL/uL Final  . Hemoglobin 08/13/2014 8.8* 12.0 - 15.0 g/dL Final  . HCT 08/13/2014 28.7* 36.0 - 46.0 % Final  . MCV 08/13/2014 85.7  78.0 - 100.0 fL Final  . MCH 08/13/2014 26.3  26.0 - 34.0 pg Final  . MCHC 08/13/2014 30.7  30.0 - 36.0 g/dL Final  . RDW 08/13/2014 15.8* 11.5 - 15.5 % Final  . Platelets 08/13/2014 283  150 - 400 K/uL Final  . Sodium 08/13/2014 141  137 - 147 mEq/L Final  . Potassium 08/13/2014 3.7  3.7 - 5.3 mEq/L Final  . Chloride 08/13/2014 102  96 - 112 mEq/L Final  . CO2 08/13/2014 26  19 - 32 mEq/L Final  . Glucose, Bld 08/13/2014 188* 70 - 99 mg/dL Final  . BUN 08/13/2014 38* 6 - 23 mg/dL Final  . Creatinine, Ser 08/13/2014 1.36* 0.50 - 1.10 mg/dL Final  . Calcium 08/13/2014 9.1  8.4 - 10.5 mg/dL Final  . GFR calc non Af Amer 08/13/2014 40* >90 mL/min Final  . GFR  calc Af Amer 08/13/2014 47* >90 mL/min Final   Comment: (NOTE) The eGFR has been calculated using the CKD EPI equation. This calculation has not been validated in all clinical situations. eGFR's persistently <90 mL/min signify possible Chronic Kidney Disease.   . Anion gap 08/13/2014 13  5 - 15 Final  . Glucose-Capillary 08/12/2014 205* 70 - 99 mg/dL Final  . Glucose-Capillary 08/12/2014 250* 70 - 99 mg/dL Final  . Glucose-Capillary 08/13/2014 158* 70 - 99 mg/dL Final  . Glucose-Capillary 08/13/2014 227* 70 - 99 mg/dL Final  . Glucose-Capillary 08/13/2014 146* 70 - 99 mg/dL Final  Hospital Outpatient Visit on 08/04/2014  Component Date Value Ref Range Status  . aPTT 08/04/2014 43* 24 - 37 seconds Final   Comment:                                 IF BASELINE aPTT IS ELEVATED,                          SUGGEST PATIENT RISK ASSESSMENT                          BE USED TO DETERMINE APPROPRIATE  ANTICOAGULANT THERAPY.  . WBC 08/04/2014 11.6* 4.0 - 10.5 K/uL Final  . RBC 08/04/2014 4.29  3.87 - 5.11 MIL/uL Final  . Hemoglobin 08/04/2014 11.3* 12.0 - 15.0 g/dL Final  . HCT 08/04/2014 37.1  36.0 - 46.0 % Final  . MCV 08/04/2014 86.5  78.0 - 100.0 fL Final  . MCH 08/04/2014 26.3  26.0 - 34.0 pg Final  . MCHC 08/04/2014 30.5  30.0 - 36.0 g/dL Final  . RDW 08/04/2014 16.2* 11.5 - 15.5 % Final  . Platelets 08/04/2014 334  150 - 400 K/uL Final  . Sodium 08/04/2014 140  137 - 147 mEq/L Final  . Potassium 08/04/2014 3.7  3.7 - 5.3 mEq/L Final  . Chloride 08/04/2014 99  96 - 112 mEq/L Final  . CO2 08/04/2014 29  19 - 32 mEq/L Final  . Glucose, Bld 08/04/2014 96  70 - 99 mg/dL Final  . BUN 08/04/2014 40* 6 - 23 mg/dL Final  . Creatinine, Ser 08/04/2014 1.65* 0.50 - 1.10 mg/dL Final  . Calcium 08/04/2014 9.5  8.4 - 10.5 mg/dL Final  . Total Protein 08/04/2014 7.8  6.0 - 8.3 g/dL Final  . Albumin 08/04/2014 3.4* 3.5 - 5.2 g/dL Final  . AST 08/04/2014 17  0 - 37 U/L Final  .  ALT 08/04/2014 9  0 - 35 U/L Final  . Alkaline Phosphatase 08/04/2014 86  39 - 117 U/L Final  . Total Bilirubin 08/04/2014 <0.2* 0.3 - 1.2 mg/dL Final  . GFR calc non Af Amer 08/04/2014 32* >90 mL/min Final  . GFR calc Af Amer 08/04/2014 37* >90 mL/min Final   Comment: (NOTE)                          The eGFR has been calculated using the CKD EPI equation.                          This calculation has not been validated in all clinical situations.                          eGFR's persistently <90 mL/min signify possible Chronic Kidney                          Disease.  . Anion gap 08/04/2014 12  5 - 15 Final  . Prothrombin Time 08/04/2014 13.7  11.6 - 15.2 seconds Final  . INR 08/04/2014 1.04  0.00 - 1.49 Final  . Color, Urine 08/04/2014 YELLOW  YELLOW Final  . APPearance 08/04/2014 CLEAR  CLEAR Final  . Specific Gravity, Urine 08/04/2014 1.015  1.005 - 1.030 Final  . pH 08/04/2014 5.5  5.0 - 8.0 Final  . Glucose, UA 08/04/2014 NEGATIVE  NEGATIVE mg/dL Final  . Hgb urine dipstick 08/04/2014 NEGATIVE  NEGATIVE Final  . Bilirubin Urine 08/04/2014 NEGATIVE  NEGATIVE Final  . Ketones, ur 08/04/2014 NEGATIVE  NEGATIVE mg/dL Final  . Protein, ur 08/04/2014 NEGATIVE  NEGATIVE mg/dL Final  . Urobilinogen, UA 08/04/2014 0.2  0.0 - 1.0 mg/dL Final  . Nitrite 08/04/2014 NEGATIVE  NEGATIVE Final  . Leukocytes, UA 08/04/2014 TRACE* NEGATIVE Final  . ABO/RH(D) 08/04/2014 O POS   Final  . Antibody Screen 08/04/2014 NEG   Final  . Sample Expiration 08/04/2014 08/14/2014   Final  . MRSA, PCR 08/04/2014 NEGATIVE  NEGATIVE Final  . Staphylococcus aureus 08/04/2014  NEGATIVE  NEGATIVE Final   Comment:                                 The Xpert SA Assay (FDA                          approved for NASAL specimens                          in patients over 18 years of age),                          is one component of                          a comprehensive surveillance                          program.   Test performance has                          been validated by [employer] for patients greater                          than or equal to 23 year old.                          It is not intended                          to diagnose infection nor to                          guide or monitor treatment.  . Squamous Epithelial / LPF 08/04/2014 RARE  RARE Final  . WBC, UA 08/04/2014 3-6  <3 WBC/hpf Final  Admission on 07/02/2014, Discharged on 07/02/2014  Component Date Value Ref Range Status  . Glucose-Capillary 07/02/2014 129* 70 - 99 mg/dL Final  . Glucose-Capillary 07/02/2014 108* 70 - 99 mg/dL Final  Office Visit on 06/29/2014  Component Date Value Ref Range Status  . aPTT 06/29/2014 39* 24 - 37 seconds Final   Comment: This test is for screening purposes only; it should not be used for                          therapeutic unfractionated heparin monitoring.  Please refer to                          Heparin Anti-Xa (71696).  . Sodium 06/29/2014 146* 135 - 145 mEq/L Final  . Potassium 06/29/2014 4.1  3.5 - 5.3 mEq/L Final  . Chloride 06/29/2014 105  96 - 112 mEq/L Final  . CO2 06/29/2014 28  19 - 32 mEq/L Final  . Glucose, Bld 06/29/2014 89  70 - 99 mg/dL Final  . BUN 06/29/2014 40* 6 - 23 mg/dL Final  . Creat 06/29/2014 1.32* 0.50 - 1.10 mg/dL Final  .  Calcium 06/29/2014 9.6  8.4 - 10.5 mg/dL Final  . WBC 06/29/2014 10.9* 4.0 - 10.5 K/uL Final  . RBC 06/29/2014 4.17  3.87 - 5.11 MIL/uL Final  . Hemoglobin 06/29/2014 10.8* 12.0 - 15.0 g/dL Final  . HCT 06/29/2014 33.9* 36.0 - 46.0 % Final  . MCV 06/29/2014 81.3  78.0 - 100.0 fL Final  . MCH 06/29/2014 25.9* 26.0 - 34.0 pg Final  . MCHC 06/29/2014 31.9  30.0 - 36.0 g/dL Final  . RDW 06/29/2014 17.2* 11.5 - 15.5 % Final  . Platelets 06/29/2014 345  150 - 400 K/uL Final  . Prothrombin Time 06/29/2014 13.5  11.6 - 15.2 seconds Final  . INR 06/29/2014 1.03  <1.50 Final   Comment: The INR is of principal  utility in following patients on stable doses                          of oral anticoagulants.  The therapeutic range is generally 2.0 to                          3.0, but may be 3.0 to 4.0 in patients with mechanical cardiac valves,                          recurrent embolisms and antiphospholipid antibodies (including lupus                          inhibitors).  . TSH 06/29/2014 0.505  0.350 - 4.500 uIU/mL Final     X-Rays:Dg Hip Complete Right  08/04/2014   CLINICAL DATA:  Preop right hip surgery peeled right total hip arthroplasty devices loose.  EXAM: RIGHT HIP - COMPLETE 2+ VIEW  COMPARISON:  02/02/2013.  02/23/2014.  FINDINGS: The femoral component of the right hip arthroplasty device appears to be intact. No periprosthetic lucency identified. No fracture. There is of lucency along the inferior margin of the acetabular component of the right hip arthroplasty device. Moderate to advanced changes involve the left hip.  IMPRESSION: 1. Lucency noted along the inferior margin of the acetabular component of the right hip arthroplasty device. This may reflect sequelae of loosening. 2. No periprosthetic fracture or dislocation.   Electronically Signed   By: Kerby Moors M.D.   On: 08/04/2014 16:51   Dg Pelvis Portable  08/11/2014   CLINICAL DATA:  Subsequent encounter for postop right hip surgery today.  EXAM: PORTABLE PELVIS 1-2 VIEWS  COMPARISON:  02/09/2013.  FINDINGS: Frontal pelvis shows interval revision of the right total hip arthroplasty. Fine detail is limited by portable technique. No gross hardware complication at this time.  IMPRESSION: Status post right total hip revision without evidence for complicating features.   Electronically Signed   By: Misty Stanley M.D.   On: 08/11/2014 18:38    EKG: Orders placed or performed during the hospital encounter of 07/02/14  . EKG 12-Lead  . EKG 12-Lead     Hospital Course: Patient was admitted to [facility] and taken to the OR and  underwent the above state procedure without complications.  Patient tolerated the procedure well and was later transferred to the recovery room and then to the orthopaedic floor for postoperative care.  They were given PO and IV analgesics for pain control following their surgery.  They were given 24 hours of postoperative antibiotics of  Anti-infectives  Start     Dose/Rate Route Frequency Ordered Stop   08/11/14 2200  ceFAZolin (ANCEF) IVPB 2 g/50 mL premix     2 g100 mL/hr over 30 Minutes Intravenous Every 6 hours 08/11/14 2042 08/12/14 0529   08/11/14 0600  ceFAZolin (ANCEF) 3 g in dextrose 5 % 50 mL IVPB     3 g160 mL/hr over 30 Minutes Intravenous On call to O.R. 08/10/14 1412 08/11/14 1611     and started on DVT prophylaxis in the form of Xarelto.   PT and OT were ordered for total hip protocol.  The patient was allowed to be WBAT with therapy. Discharge planning was consulted to help with postop disposition and equipment needs. Medicine consult was called to assist with medical management of the patient.  Reason for consult multiple medical problems:  HPI: Shenique Childers Tapley is a 63 y.o. female   has a past medical history of History of hip replacement, total; MRSA infection; Arthritis; GERD (gastroesophageal reflux disease); Gout; Diabetes mellitus; Hypertension; Thyroid disease; Hyperlipidemia; TIA (transient ischemic attack); Positive cardiac stress test (12/2013); cardiac cath (06/2014); Asthma; Headache; Loosening of prosthetic hip; Cramping of feet; Stress incontinence; History of transfusion; and Sleep apnea.  Patient had undergone right hip revision on 11/4. Her medical problems include DM 2 on insulin 45/80, HTN, systolic CHF and CKD with Cr at 1.6 1 week ago. She denies any recent chest pain or shortness of breath. Patient was not aware of CKD diagnosis. She is on Lasix 40 mg a day. Patient endorses peripheral edema. She has 2 pillow orthopnea. Denies any significant dyspnea. She  has known hx of OSA but refuses CPAP. While being evaluated noted to have apneic episode with desaturation despite being on 2 L of O2.  In PACU patient was noted to have decreased urine output she was given 1L NS bolus and when was brought back up to the floor. Put out 50 ml of clear urine.  Hospitalist was called for consult for Management of medical problems.  POD 1 - Patient had a tough night on the evening of surgery but was doing a little better the next morning.  They started to get up OOB with therapy on day one.  Hemovac drain was pulled without difficulty.  The knee immobilizer was removed and discontinued.   POD 2 - Continued to work with therapy into day two.  Dressing was changed on day two and the incision was healing well. Assessment/Plan: #1 failed right total hip arthroplasty Status post right total hip arthroplasty revision 08/11/2014. Per primary team. #2 poor urine output Likely secondary to volume depletion secondary to poor oral intake prior to surgery. Improvement in urine output with gentle hydration. Continue to hold Lasix today. Follow. #3 hypertension Continue Cozaar and HCTZ. Will add low-dose Norvasc. #4 hypothyroidism Continue home dose Synthroid. #5 diabetes mellitus Hemoglobin A1c is 6.9. CBGs have ranged from 123 -192. Continue current regimen of 70/30. Sliding scale insulin. #6 obstructive sleep apnea Patient stated unable to tolerate full facemask at home and that's why she doesn't use her CPAP. Will place on CPAP during this hospitalization with a nasal mask. #7 chronic kidney disease stage III Stable. Continue to hold Lasix. Follow. Monitor for fluid overload. #8 tobacco abuse Tobacco cessation. #9 history of TIAs Patient on xarelto for DVT prophylaxis.Once DVT prophylaxis is done patient will be placed back on Plavix.  Patient was seen in rounds  On day two by Dr. Wynelle Link and was ready to go home.  Discharge home with home health Diet - Cardiac  diet, Diabetic diet and Renal diet Follow up - in 2 weeks Activity - WBAT Disposition - Home Condition Upon Discharge - improving D/C Meds - See DC Summary DVT Prophylaxis - Xarelto      Discharge Instructions    Call MD / Call 911    Complete by:  As directed   If you experience chest pain or shortness of breath, CALL 911 and be transported to the hospital emergency room.  If you develope a fever above 101 F, pus (white drainage) or increased drainage or redness at the wound, or calf pain, call your surgeon's office.     Change dressing    Complete by:  As directed   You may change your dressing dressing daily with sterile 4 x 4 inch gauze dressing and paper tape.  Do not submerge the incision under water.     Constipation Prevention    Complete by:  As directed   Drink plenty of fluids.  Prune juice may be helpful.  You may use a stool softener, such as Colace (over the counter) 100 mg twice a day.  Use MiraLax (over the counter) for constipation as needed.     Diet - low sodium heart healthy    Complete by:  As directed      Diet Carb Modified    Complete by:  As directed      Discharge instructions    Complete by:  As directed   Pick up stool softner and laxative for home. Do not submerge incision under water. May shower. Continue to use ice for pain and swelling from surgery. Hip precautions.  Total Hip Protocol.  Take Xarelto for eight more days, then discontinue Xarelto. Once the patient has completed the Xarelto after eight days, they may resume the Plavix 75 mg daily at home.     Do not sit on low chairs, stoools or toilet seats, as it may be difficult to get up from low surfaces    Complete by:  As directed      Driving restrictions    Complete by:  As directed   No driving until released by the physician.     Follow the hip precautions as taught in Physical Therapy    Complete by:  As directed      Increase activity slowly as tolerated    Complete by:  As directed       Lifting restrictions    Complete by:  As directed   No lifting until released by the physician.     Patient may shower    Complete by:  As directed   You may shower without a dressing once there is no drainage.  Do not wash over the wound.  If drainage remains, do not shower until drainage stops.     TED hose    Complete by:  As directed   Use stockings (TED hose) for 3 weeks on both leg(s).  You may remove them at night for sleeping.     Weight bearing as tolerated    Complete by:  As directed             Medication List    STOP taking these medications        B-complex with vitamin C tablet     clopidogrel 75 MG tablet  Commonly known as:  PLAVIX     multivitamin with minerals Tabs tablet     omega-3  acid ethyl esters 1 G capsule  Commonly known as:  LOVAZA     ONE TOUCH ULTRA MINI W/DEVICE Kit     oxyCODONE-acetaminophen 5-325 MG per tablet  Commonly known as:  PERCOCET/ROXICET     tiZANidine 4 MG tablet  Commonly known as:  ZANAFLEX     VITAMIN D PO      TAKE these medications        allopurinol 100 MG tablet  Commonly known as:  ZYLOPRIM  Take 100 mg by mouth every morning.     budesonide-formoterol 160-4.5 MCG/ACT inhaler  Commonly known as:  SYMBICORT  Inhale 2 puffs into the lungs 2 (two) times daily.     esomeprazole 40 MG capsule  Commonly known as:  NEXIUM  Take 40 mg by mouth every morning.     fluticasone 50 MCG/ACT nasal spray  Commonly known as:  FLONASE  Place 2 sprays into the nose as needed for allergies.     furosemide 40 MG tablet  Commonly known as:  LASIX  Take 40 mg by mouth at bedtime.     gemfibrozil 600 MG tablet  Commonly known as:  LOPID  Take 600 mg by mouth 2 (two) times daily before a meal.     hydrochlorothiazide 12.5 MG capsule  Commonly known as:  MICROZIDE  Take 12.5 mg by mouth every morning.     insulin aspart protamine- aspart (70-30) 100 UNIT/ML injection  Commonly known as:  NOVOLOG MIX 70/30    Inject 30-40 Units into the skin 2 (two) times daily with a meal. 40 units in the morning and 30 units in the evening.     KLOR-CON M20 20 MEQ tablet  Generic drug:  potassium chloride SA  Take 20 mEq by mouth at bedtime.     levothyroxine 137 MCG tablet  Commonly known as:  SYNTHROID, LEVOTHROID  Take 137 mcg by mouth daily before breakfast.     losartan 100 MG tablet  Commonly known as:  COZAAR  Take 1 tablet (100 mg total) by mouth every morning.     methocarbamol 500 MG tablet  Commonly known as:  ROBAXIN  Take 1 tablet (500 mg total) by mouth every 6 (six) hours as needed for muscle spasms.     naftifine 1 % cream  Commonly known as:  NAFTIN  Apply 1 application topically daily as needed (rash).     ONE TOUCH ULTRA TEST test strip  Generic drug:  glucose blood  1 each by Other route 3 (three) times daily as needed (testing blood sugar.).     oxyCODONE 5 MG immediate release tablet  Commonly known as:  Oxy IR/ROXICODONE  Take 1-4 tablets (5-20 mg total) by mouth every 3 (three) hours as needed for moderate pain, severe pain or breakthrough pain.     polyvinyl alcohol 1.4 % ophthalmic solution  Commonly known as:  LIQUIFILM TEARS  Place 2 drops into both eyes daily as needed for dry eyes.     rivaroxaban 10 MG Tabs tablet  Commonly known as:  XARELTO  - Take 1 tablet (10 mg total) by mouth daily with breakfast. Take Xarelto for eight more days, then discontinue Xarelto.  - Once the patient has completed the Xarelto after eight days, they may resume the Plavix 75 mg daily at home.     rosuvastatin 10 MG tablet  Commonly known as:  CRESTOR  Take 10 mg by mouth at bedtime.     traMADol 50 MG tablet  Commonly known as:  ULTRAM  Take 1-2 tablets (50-100 mg total) by mouth every 6 (six) hours as needed (mild pain).     VESICARE 10 MG tablet  Generic drug:  solifenacin  Take 10 mg by mouth every morning.       Follow-up Information    Follow up with [facility].   Why:  home health physical therapy   Contact information:   [address] [place] [address] [phone]       Follow up with [facility].   Why:  rolling walker   Contact information:   [address] [place] [address] [phone]       Follow up with Gearlean Alf, MD. Schedule an appointment as soon as possible for a visit on 08/24/2014.   Specialty:  Orthopedic Surgery   Why:  Call office at [phone] to set up appointment on Tuesday 08/24/2014 with Dr. Wynelle Link.   Contact information:   [address] [place] [address] [phone]       Follow up with [name], MD Today.   Specialty:  Internal Medicine   Why:  f/u as scheduled   Contact information:   [address] [place] [address] [phone]       Signed: Arlee Muslim, PA-C Orthopaedic Surgery 08/26/2014, 9:17 AM

## 2014-08-14 NOTE — Progress Notes (Signed)
CARE MANAGEMENT NOTE 08/14/2014  Patient:  [name] [name],[name] [name]   Account Number:  [id number]  Date Initiated:  08/12/2014  Documentation initiated by:  [facility]  Subjective/Objective Assessment:   adm:  RIGHT ACETABULAR REVISION (Right)     Action/Plan:   discharge planning   Anticipated DC Date:  08/12/2014   Anticipated DC Plan:  [place]  CM consult      [facility]  HOME HEALTH   Choice offered to / List presented to:  C-1 Patient   DME arranged  [place]      DME agency  [place] arranged  HH-2 PT      [place]   Status of service:  Completed, signed off Medicare Important Message given?  NA - LOS <3 / Initial given by admissions (If response is "NO", the following Medicare IM given date fields will be blank) Date Medicare IM given:   Medicare IM given by:   Date Additional Medicare IM given:   Additional Medicare IM given by:    Discharge Disposition:  [place]  Per UR Regulation:    If discussed at Long Length of Stay Meetings, dates discussed:    Comments:  [id number] and order for face to face and rolling walker faxed to [employer]/request for r.walker to be delivered to the patients room at 1210.  Fax successful transmission recieved/rhonda davis,RN,BSN,CCM  08/12/14 07:45 CM met with pt in room to offer choice of home health agency.  Pt chooses [facility] to render HHPT.  Pt needs rolling walker and CM waiting for MD to place DME order Mcarthur Rossetti requires MD electronic signature).  CM will fax request to [facility] ins.). Address and contact information verified with pt.  Referral called to [facility] for HHPT.  No other CM needs were communicated. Mariane Masters, BSN, CM [phone].

## 2014-08-31 ENCOUNTER — Encounter: Payer: Self-pay | Admitting: Neurology

## 2014-09-15 ENCOUNTER — Ambulatory Visit (INDEPENDENT_AMBULATORY_CARE_PROVIDER_SITE_OTHER): Payer: Commercial Managed Care - HMO | Admitting: Physician Assistant

## 2014-09-15 ENCOUNTER — Encounter: Payer: Self-pay | Admitting: Physician Assistant

## 2014-09-15 VITALS — BP 178/92 | HR 93 | Ht 64.0 in | Wt 310.8 lb

## 2014-09-15 DIAGNOSIS — I10 Essential (primary) hypertension: Secondary | ICD-10-CM

## 2014-09-15 DIAGNOSIS — I5022 Chronic systolic (congestive) heart failure: Secondary | ICD-10-CM

## 2014-09-15 DIAGNOSIS — E785 Hyperlipidemia, unspecified: Secondary | ICD-10-CM

## 2014-09-15 MED ORDER — AMLODIPINE BESYLATE 5 MG PO TABS: 5.0000 mg | ORAL_TABLET | Freq: Every day | ORAL | Status: DC

## 2014-09-15 NOTE — Assessment & Plan Note (Signed)
She seems to be euvolemic. However given her body habitus it's hard to tell. Her lungs are clear on exam and she has minimal lower extremity edema. Continue current Lasix and HCTZ doses.

## 2014-09-15 NOTE — Assessment & Plan Note (Signed)
We discussed diet and exercise in detail.  She did see a dietitian one time but is not interested in going back. She feels confident that she can do this on her own. We discussed all the foods that she needs to avoid. She will likely need insulin adjustment, if she can actually decrease her carbohydrate intake.

## 2014-09-15 NOTE — Assessment & Plan Note (Signed)
Lipid Panel     Component Value Date/Time   CHOL  11/28/2008 0630    151        ATP III CLASSIFICATION:  <200     mg/dL   Desirable  200-239  mg/dL   Borderline High  >=240    mg/dL   High          TRIG 103 12/23/2013 1132   HDL 48 12/23/2013 1132   HDL <10* 11/28/2008 0630   CHOLHDL 3.9 12/23/2013 1132   CHOLHDL NOT CALCULATED 11/28/2008 0630   VLDL 49* 11/28/2008 0630   LDLCALC 119* 12/23/2013 1132   LDLCALC NOT CALCULATED 11/28/2008 0630   Continue statin

## 2014-09-15 NOTE — Patient Instructions (Signed)
Your physician wants you to follow-up in: 6 Months with Dr Andria Rhein will receive a reminder letter in the mail two months in advance. If you don't receive a letter, please call our office to schedule the follow-up appointment.  Your physician recommends that you schedule a follow-up appointment in: 2 weeks with [facility] for Blood pressure check  Your physician has recommended you make the following change in your medication: Start Amlodipine 5 mg daily

## 2014-09-15 NOTE — Progress Notes (Signed)
Patient ID: [name] [name], female   DOB: 1951/03/21, 63 y.o.   MRN: [id number]    Date:  09/15/2014   ID:  [name], [name] [birth year], MRN [id number]  PCP:  [name] Fendt, MD  Primary Cardiologist:  [name] [name]    History of Present Illness: [name] [name] is a 63 y.o. female severely overweight married African American female mother of one son, grandmother of 2 grandchildren referred for cardiovascular evaluation because of multiple cardiac risk factors and recent TIAs. Her neurologist is Dr. Krista [name] and her primary care physician is Dr. Shawna [name]. Her cardiovascular risk factor profile is notable for 50 pack years of tobacco abuse continuing to smoke 7 cigarettes a day. Her mother died of a myocardial function age 64. She has treated hypertension, diabetes and hyperlipidemia. She has never had a heart attack. She apparently said to TIAs which resolved and has had a neurologic workup by Dr. Krista [name]. Her carotid Dopplers were unremarkable. And the monitor showed PVCs. A 2-D echo showed low normal LV function with an ejection fraction of 45-50% and question of a lateral wall motion abnormality. The patient was put on Plavix prophylactically. She had a Myoview stress test performed on 06/24/14 which was read as a high risk with anterolateral ischemia.   The patient underwent cardiac catheterization which revealed normal coronary arteries and the ejection fraction of 40-45%. She had her hip surgery and feels fantastic. She's been having pain now for 5 years and is completely resolved. She no longer requires pain medicine.  She was severely short of breath upon walking into the office. She likely has not been doing any walking for the last 5 years and is severely deconditioned. The patient currently denies nausea, vomiting, fever, chest pain, shortness of breath, orthopnea, dizziness, PND, cough, congestion, abdominal pain, hematochezia, melena, lower extremity edema.  Wt Readings from Last 3 Encounters:    09/15/14 310 lb 12.8 oz (140.978 kg)  08/13/14 311 lb 4.6 oz (141.2 kg)  08/04/14 310 lb (140.615 kg)     Past Medical History  Diagnosis Date  . History of hip replacement, total   . Hx MRSA infection     abscess left groin  . Arthritis   . GERD (gastroesophageal reflux disease)   . Gout   . Diabetes mellitus   . Hypertension   . Thyroid disease   . Hyperlipidemia   . TIA (transient ischemic attack)      X2 NO RESIDUAL PROBLEMS  . Positive cardiac stress test 12/2013    anterior and lateral ischemia on Myoview  . Hx of cardiac cath 06/2014  . Asthma   . Headache   . Loosening of prosthetic hip   . Cramping of feet   . Stress incontinence   . History of transfusion   . Sleep apnea     UNABLE TO TOLERATE C PAP    Current Outpatient Prescriptions  Medication Sig Dispense Refill  . allopurinol (ZYLOPRIM) 100 MG tablet Take 100 mg by mouth every morning.     . budesonide-formoterol (SYMBICORT) 160-4.5 MCG/ACT inhaler Inhale 2 puffs into the lungs 2 (two) times daily.    . clopidogrel (PLAVIX) 75 MG tablet Take 1 tablet by mouth daily.  8  . esomeprazole (NEXIUM) 40 MG capsule Take 40 mg by mouth every morning.    . fluticasone (FLONASE) 50 MCG/ACT nasal spray Place 2 sprays into the nose as needed for allergies.     . furosemide (LASIX) 40 MG tablet Take 40 mg  by mouth at bedtime.     Marland Kitchen gemfibrozil (LOPID) 600 MG tablet Take 600 mg by mouth 2 (two) times daily before a meal.    . hydrochlorothiazide (,MICROZIDE/HYDRODIURIL,) 12.5 MG capsule Take 12.5 mg by mouth every morning.     . insulin aspart protamine-insulin aspart (NOVOLOG 70/30) (70-30) 100 UNIT/ML injection Inject 30-40 Units into the skin 2 (two) times daily with a meal. 40 units in the morning and 30 units in the evening.    Marland Kitchen KLOR-CON M20 20 MEQ tablet Take 20 mEq by mouth at bedtime.     Marland Kitchen levothyroxine (SYNTHROID, LEVOTHROID) 137 MCG tablet Take 137 mcg by mouth daily before breakfast.    . losartan (COZAAR)  100 MG tablet Take 1 tablet (100 mg total) by mouth every morning. 90 tablet 3  . methocarbamol (ROBAXIN) 500 MG tablet Take 1 tablet (500 mg total) by mouth every 6 (six) hours as needed for muscle spasms. 90 tablet 0  . naftifine (NAFTIN) 1 % cream Apply 1 application topically daily as needed (rash).    . ONE TOUCH ULTRA TEST test strip 1 each by Other route 3 (three) times daily as needed (testing blood sugar.).     Marland Kitchen polyvinyl alcohol (LIQUIFILM TEARS) 1.4 % ophthalmic solution Place 2 drops into both eyes daily as needed for dry eyes.    . rosuvastatin (CRESTOR) 10 MG tablet Take 10 mg by mouth at bedtime.    . traMADol (ULTRAM) 50 MG tablet Take 1-2 tablets (50-100 mg total) by mouth every 6 (six) hours as needed (mild pain). 60 tablet 1  . VESICARE 10 MG tablet Take 10 mg by mouth every morning.     Marland Kitchen amLODipine (NORVASC) 5 MG tablet Take 1 tablet (5 mg total) by mouth daily. 30 tablet 6   No current facility-administered medications for this visit.    Allergies:    Allergies  Allergen Reactions  . Ace Inhibitors     Tongue swell  . Morphine And Related Itching    Social History:  The patient  reports that she has been smoking Cigarettes.  She has a 10 pack-year smoking history. She has never used smokeless tobacco. She reports that she drinks about 0.5 oz of alcohol per week. She reports that she does not use illicit drugs.   Family history:   Family History  Problem Relation Age of Onset  . Diabetes Brother   . Cardiomyopathy Mother     ROS:  Please see the history of present illness.  All other systems reviewed and negative.   PHYSICAL EXAM: VS:  BP 178/92 mmHg  Pulse 93  Ht 5\' 4"  (1.626 m)  Wt 310 lb 12.8 oz (140.978 kg)  BMI 53.32 kg/m2 Morbidly obese, well developed, in no acute distress HEENT: Pupils are equal round react to light accommodation extraocular movements are intact.  Neck:No cervical lymphadenopathy. Cardiac: Regular rate and rhythm without murmurs  rubs or gallops. Lungs:  clear to auscultation bilaterally, no wheezing, rhonchi or rales Abd: soft, nontender, positive bowel sounds Ext: 1+ lower extremity edema.  2+ radial and dorsalis pedis pulses. Skin: warm and dry Neuro:  Grossly normal  EKG:  Normal sinus rhythm to ventricular conduction delay. Rate 92 bpm  ASSESSMENT AND PLAN:  Problem List Items Addressed This Visit    Chronic systolic CHF (congestive heart failure)    She seems to be euvolemic. However given her body habitus it's hard to tell. Her lungs are clear on exam and she  has minimal lower extremity edema. Continue current Lasix and HCTZ doses.    Relevant Medications      amLODIpine (NORVASC) tablet   HTN (hypertension) - Primary    Adding amlodipine 5 mg daily. She'll follow-up in 2 weeks for blood pressure check.    Relevant Medications      amLODIpine (NORVASC) tablet   Other Relevant Orders      EKG 12-Lead   Hyperlipidemia    Lipid Panel     Component Value Date/Time   CHOL  11/28/2008 0630    151        ATP III CLASSIFICATION:  <200     mg/dL   Desirable  200-239  mg/dL   Borderline High  >=240    mg/dL   High          TRIG 103 12/23/2013 1132   HDL 48 12/23/2013 1132   HDL <10* 11/28/2008 0630   CHOLHDL 3.9 12/23/2013 1132   CHOLHDL NOT CALCULATED 11/28/2008 0630   VLDL 49* 11/28/2008 0630   LDLCALC 119* 12/23/2013 1132   LDLCALC NOT CALCULATED 11/28/2008 0630   Continue statin     Relevant Medications      amLODIpine (NORVASC) tablet   Morbid obesity    We discussed diet and exercise in detail.  She did see a dietitian one time but is not interested in going back. She feels confident that she can do this on her own. We discussed all the foods that she needs to avoid. She will likely need insulin adjustment, if she can actually decrease her carbohydrate intake.

## 2014-09-15 NOTE — Assessment & Plan Note (Signed)
Adding amlodipine 5 mg daily. She'll follow-up in 2 weeks for blood pressure check.

## 2014-09-16 ENCOUNTER — Encounter (HOSPITAL_COMMUNITY): Payer: Self-pay | Admitting: Cardiovascular Disease

## 2014-09-18 ENCOUNTER — Encounter (HOSPITAL_COMMUNITY): Payer: Self-pay | Admitting: Emergency Medicine

## 2014-09-18 ENCOUNTER — Emergency Department (HOSPITAL_COMMUNITY)
Admission: EM | Admit: 2014-09-18 | Discharge: 2014-09-18 | Disposition: A | Discharge status: Home / Self Care | Payer: Commercial Managed Care - HMO | Attending: Emergency Medicine | Admitting: Emergency Medicine

## 2014-09-18 DIAGNOSIS — J45909 Unspecified asthma, uncomplicated: Secondary | ICD-10-CM | POA: Diagnosis not present

## 2014-09-18 DIAGNOSIS — Z720 Tobacco use: Secondary | ICD-10-CM | POA: Insufficient documentation

## 2014-09-18 DIAGNOSIS — E079 Disorder of thyroid, unspecified: Secondary | ICD-10-CM | POA: Insufficient documentation

## 2014-09-18 DIAGNOSIS — M109 Gout, unspecified: Secondary | ICD-10-CM | POA: Diagnosis not present

## 2014-09-18 DIAGNOSIS — M62838 Other muscle spasm: Secondary | ICD-10-CM | POA: Insufficient documentation

## 2014-09-18 DIAGNOSIS — E119 Type 2 diabetes mellitus without complications: Secondary | ICD-10-CM | POA: Diagnosis not present

## 2014-09-18 DIAGNOSIS — Z794 Long term (current) use of insulin: Secondary | ICD-10-CM | POA: Insufficient documentation

## 2014-09-18 DIAGNOSIS — G4730 Sleep apnea, unspecified: Secondary | ICD-10-CM | POA: Insufficient documentation

## 2014-09-18 DIAGNOSIS — Z96641 Presence of right artificial hip joint: Secondary | ICD-10-CM | POA: Diagnosis not present

## 2014-09-18 DIAGNOSIS — Z79899 Other long term (current) drug therapy: Secondary | ICD-10-CM | POA: Insufficient documentation

## 2014-09-18 DIAGNOSIS — K219 Gastro-esophageal reflux disease without esophagitis: Secondary | ICD-10-CM | POA: Diagnosis not present

## 2014-09-18 DIAGNOSIS — Z7901 Long term (current) use of anticoagulants: Secondary | ICD-10-CM | POA: Insufficient documentation

## 2014-09-18 DIAGNOSIS — Z7951 Long term (current) use of inhaled steroids: Secondary | ICD-10-CM | POA: Diagnosis not present

## 2014-09-18 DIAGNOSIS — I10 Essential (primary) hypertension: Secondary | ICD-10-CM | POA: Diagnosis not present

## 2014-09-18 DIAGNOSIS — Z8619 Personal history of other infectious and parasitic diseases: Secondary | ICD-10-CM | POA: Insufficient documentation

## 2014-09-18 DIAGNOSIS — Z8673 Personal history of transient ischemic attack (TIA), and cerebral infarction without residual deficits: Secondary | ICD-10-CM | POA: Insufficient documentation

## 2014-09-18 MED ORDER — DIAZEPAM 5 MG PO TABS: 5.0000 mg | ORAL_TABLET | Freq: Two times a day (BID) | ORAL | Status: DC

## 2014-09-18 MED ORDER — HYDROMORPHONE HCL 1 MG/ML IJ SOLN
1.0000 mg | Freq: Once | INTRAMUSCULAR | Status: AC
  Administered 2014-09-18: 1 mg via INTRAMUSCULAR
  Filled 2014-09-18: qty 1

## 2014-09-18 NOTE — Discharge Instructions (Signed)
Return to the emergency room with worsening of symptoms, new symptoms or with symptoms that are concerning, especially fevers, loss of control of bladder or bowels, numbness or tingling around genital region or anus, weakness. RICE: Rest, Ice (three cycles of 20 mins on, 77mins off at least twice a day), compression/brace, elevation. Heating pad works well for back pain. Thallium for severe muscle spasms. Do not drink alcohol, take other sedating meds, drive or operate any machinery. Follow up with PCP if symptoms worsen or are persistent. Continue to drink plenty of fluids and take your medicines as prescribed.   Heat Therapy Heat therapy can help make painful, stiff muscles and joints feel better. Do not use heat on new injuries. Wait at least 48 hours after an injury to use heat. Do not use heat when you have aches or pains right after an activity. If you still have pain 3 hours after stopping the activity, then you may use heat. HOME CARE Wet heat pack  Soak a clean towel in warm water. Squeeze out the extra water.  Put the warm, wet towel in a plastic bag.  Place a thin, dry towel between your skin and the bag.  Put the heat pack on the area for 5 minutes, and check your skin. Your skin may be pink, but it should not be red.  Leave the heat pack on the area for 15 to 30 minutes.  Repeat this every 2 to 4 hours while awake. Do not use heat while you are sleeping. Warm water bath  Fill a tub with warm water.  Place the affected body part in the tub.  Soak the area for 20 to 40 minutes.  Repeat as needed. Hot water bottle  Fill the water bottle half full with hot water.  Press out the extra air. Close the cap tightly.  Place a dry towel between your skin and the bottle.  Put the bottle on the area for 5 minutes, and check your skin. Your skin may be pink, but it should not be red.  Leave the bottle on the area for 15 to 30 minutes.  Repeat this every 2 to 4 hours while  awake. Electric heating pad  Place a dry towel between your skin and the heating pad.  Set the heating pad on low heat.  Put the heating pad on the area for 10 minutes, and check your skin. Your skin may be pink, but it should not be red.  Leave the heating pad on the area for 20 to 40 minutes.  Repeat this every 2 to 4 hours while awake.  Do not lie on the heating pad.  Do not fall asleep while using the heating pad.  Do not use the heating pad near water. GET HELP RIGHT AWAY IF:  You get blisters or red skin.  Your skin is puffy (swollen), or you lose feeling (numbness) in the affected area.  You have any new problems.  Your problems are getting worse.  You have any questions or concerns. If you have any problems, stop using heat therapy until you see your doctor. MAKE SURE YOU:  Understand these instructions.  Will watch your condition.  Will get help right away if you are not doing well or get worse. Document Released: 12/17/2011 Document Reviewed: 11/17/2013 [facility] Patient Information 2015 [place]. This information is not intended to replace advice given to you by your health care provider. Make sure you discuss any questions you have with your health  care provider.

## 2014-09-18 NOTE — ED Notes (Signed)
Declined W/C at D/C and was escorted to lobby by RN. 

## 2014-09-18 NOTE — ED Provider Notes (Signed)
CSN: [id number]     Arrival date & time 09/18/14  1447 History   This chart was scribed for non-physician practitione, Doyce Loose, PA-C working with Carmin Muskrat, MD, by Erling Conte, ED Scribe. This patient was seen in room TR05C/TR05C and the patient's care was started at 3:30 PM.    Chief Complaint  Patient presents with  . Spasms    The history is provided by the patient. No language interpreter was used.    HPI Comments: [name] [name] is a 63 y.o. female with a h/o hip replacement, arthritis, GERD, gout, DM, HTN, thyroid disease, hyperlipidemia, TIA, asthma, and sleep apnea who presents to the Emergency Department complaining of generalized body spasms that began about 12 hours ago. She states she woke up in the middle of the night and began having "cramping" pain that spread all over her body. She is having an associated mild HA that came on gradually. Pt notes she does not get HAs often. But it is similar to other HA. Pt applied an ice back pain no relief. She also took her prescribed Tramadol and muscle relaxer with no relief. Pt states the pain is worse with movement. She is having difficulty ambulating due to pain and notes she usually walks with a walker.She has a nurses aids that comes to help her at home during the day. She denies any fever, chills, chest pain, nausea, vomiting, or abdominal pain, saddle anesthesia, numbness, tingling or weakness. No fevers, chills, night sweats, weight loss, IVDU, history of malignancy.   PCP: Dr. [name]     Past Medical History  Diagnosis Date  . History of hip replacement, total   . Hx MRSA infection     abscess left groin  . Arthritis   . GERD (gastroesophageal reflux disease)   . Gout   . Diabetes mellitus   . Hypertension   . Thyroid disease   . Hyperlipidemia   . TIA (transient ischemic attack)      X2 NO RESIDUAL PROBLEMS  . Positive cardiac stress test 12/2013    anterior and lateral ischemia on Myoview  . Hx of  cardiac cath 06/2014  . Asthma   . Headache   . Loosening of prosthetic hip   . Cramping of feet   . Stress incontinence   . History of transfusion   . Sleep apnea     UNABLE TO TOLERATE C PAP   Past Surgical History  Procedure Laterality Date  . Thyroidectomy    . Abdominal hysterectomy    . Laparoscopic cholecystectomy    . Forearm fracture surgery      Left arm  . Hernia repair      w/ mesh  . Incision and drainage abscess Left 02/04/2013    Procedure: INCISION AND DRAINAGE LEFT BUTTOCK ABSCESS; INCISION AND DRAINAGE LEFT BREAST ABSCESS;  Surgeon: Harl Bowie, MD;  Location: [facility];  Service: General;  Laterality: Left;  . Irrigation and debridement abscess N/A 02/06/2013    Procedure: IRRIGATION AND DEBRIDEMENT BUTTOCK ABSCESS AND DRESSING CHANGE;  Surgeon: Harl Bowie, MD;  Location: [place];  Service: General;  Laterality: N/A;  . Irrigation and debridement abscess Left 02/08/2013    Procedure: IRRIGATION AND DEBRIDEMENT ABSCESS/DRESSING CHANGE;  Surgeon: Gwenyth Ober, MD;  Location: [place];  Service: General;  Laterality: Left;  . Incision and drainage perirectal abscess Left 02/10/2013    Procedure: IRRIGATION AND DEBRIDEMENT OF BUTTOCK/PERINEAL ABSCESS;  Surgeon: [name]. Georgette Dover, MD;  Location: [facility];  Service: General;  Laterality: Left;  . Incision and drainage abscess N/A 02/12/2013    Procedure: INCISION AND DEBRIDEMENT BUTTOCK WOUND ;  Surgeon: [name]. Georgette Dover, MD;  Location: [place];  Service: General;  Laterality: N/A;  . Incision and drainage abscess N/A 02/14/2013    Procedure: INCISION AND DRAINAGE/DRESSING CHANGE;  Surgeon: Harl Bowie, MD;  Location: [place];  Service: General;  Laterality: N/A;  . Incision and drainage perirectal abscess N/A 02/16/2013    Procedure: IRRIGATION AND DEBRIDEMENT PERINEAL ABSCESS;  Surgeon: Zenovia Jarred, MD;  Location: [facility];  Service: General;  Laterality: N/A;  . Joint replacement  2010 / 2012   . Colon surgery  1995     DUE TO POLYP  . Total hip revision Right 08/11/2014    Procedure: RIGHT ACETABULAR REVISION;  Surgeon: Gearlean Alf, MD;  Location: [facility];  Service: Orthopedics;  Laterality: Right;  . Left heart catheterization with coronary angiogram N/A 07/02/2014    Procedure: LEFT HEART CATHETERIZATION WITH CORONARY ANGIOGRAM;  Surgeon: Lorretta Harp, MD;  Location: [facility] C[facility];  Service: Cardiovascular;  Laterality: N/A;   Family History  Problem Relation Age of Onset  . Diabetes Brother   . Cardiomyopathy Mother    History  Substance Use Topics  . Smoking status: Current Every Day Smoker -- 0.25 packs/day for 40 years    Types: Cigarettes  . Smokeless tobacco: Never Used  . Alcohol Use: 0.5 oz/week    1 Not specified per week     Comment: OCC   OB History    No data available     Review of Systems  Constitutional: Negative for fever and chills.  Cardiovascular: Negative for chest pain.  Gastrointestinal: Negative for nausea, vomiting and abdominal pain.  Endocrine: Negative for polyuria.  Genitourinary: Negative for frequency, flank pain and difficulty urinating.  Musculoskeletal: Positive for myalgias (generalized) and gait problem (due to pain).  Skin: Negative for wound.  Neurological: Positive for headaches. Negative for weakness and numbness.      Allergies  Ace inhibitors and Morphine and related  Home Medications   Prior to Admission medications   Medication Sig Start Date End Date Taking? Authorizing Provider  allopurinol (ZYLOPRIM) 100 MG tablet Take 100 mg by mouth every morning.     Historical Provider, MD  amLODipine (NORVASC) 5 MG tablet Take 1 tablet (5 mg total) by mouth daily. 09/15/14   Brett Canales, PA-C  budesonide-formoterol (SYMBICORT) 160-4.5 MCG/ACT inhaler Inhale 2 puffs into the lungs 2 (two) times daily.    Historical Provider, MD  clopidogrel (PLAVIX) 75 MG tablet Take 1 tablet by mouth daily. 06/13/14   Historical Provider, MD  diazepam (VALIUM) 5  MG tablet Take 1 tablet (5 mg total) by mouth 2 (two) times daily. 09/18/14   Pura Spice, PA-C  esomeprazole (NEXIUM) 40 MG capsule Take 40 mg by mouth every morning.    Historical Provider, MD  fluticasone (FLONASE) 50 MCG/ACT nasal spray Place 2 sprays into the nose as needed for allergies.     Historical Provider, MD  furosemide (LASIX) 40 MG tablet Take 40 mg by mouth at bedtime.  04/07/13   Historical Provider, MD  gemfibrozil (LOPID) 600 MG tablet Take 600 mg by mouth 2 (two) times daily before a meal.    Historical Provider, MD  hydrochlorothiazide (,MICROZIDE/HYDRODIURIL,) 12.5 MG capsule Take 12.5 mg by mouth every morning.     Historical Provider, MD  insulin aspart  protamine-insulin aspart (NOVOLOG 70/30) (70-30) 100 UNIT/ML injection Inject 30-40 Units into the skin 2 (two) times daily with a meal. 40 units in the morning and 30 units in the evening.    Historical Provider, MD  KLOR-CON M20 20 MEQ tablet Take 20 mEq by mouth at bedtime.  04/08/13   Historical Provider, MD  levothyroxine (SYNTHROID, LEVOTHROID) 137 MCG tablet Take 137 mcg by mouth daily before breakfast.    Historical Provider, MD  losartan (COZAAR) 100 MG tablet Take 1 tablet (100 mg total) by mouth every morning. 08/05/14   Lorretta Harp, MD  methocarbamol (ROBAXIN) 500 MG tablet Take 1 tablet (500 mg total) by mouth every 6 (six) hours as needed for muscle spasms. 08/13/14   Arlee Muslim, PA-C  naftifine (NAFTIN) 1 % cream Apply 1 application topically daily as needed (rash).    Historical Provider, MD  ONE TOUCH ULTRA TEST test strip 1 each by Other route 3 (three) times daily as needed (testing blood sugar.).  11/25/13   Historical Provider, MD  polyvinyl alcohol (LIQUIFILM TEARS) 1.4 % ophthalmic solution Place 2 drops into both eyes daily as needed for dry eyes.    Historical Provider, MD  rosuvastatin (CRESTOR) 10 MG tablet Take 10 mg by mouth at bedtime.    Historical Provider, MD  traMADol (ULTRAM) 50 MG  tablet Take 1-2 tablets (50-100 mg total) by mouth every 6 (six) hours as needed (mild pain). 08/13/14   Arlee Muslim, PA-C  VESICARE 10 MG tablet Take 10 mg by mouth every morning.  09/24/13   Historical Provider, MD   Triage Vitals: BP 183/94 mmHg  Pulse 109  Temp(Src) 98.2 F (36.8 C)  Resp 18  Ht 5\' 4"  (1.626 m)  Wt 300 lb (136.079 kg)  BMI 51.47 kg/m2  SpO2 95%  Physical Exam  Constitutional: She appears well-developed and well-nourished. No distress.  HENT:  Head: Normocephalic and atraumatic.  Eyes: Conjunctivae and EOM are normal. Right eye exhibits no discharge. Left eye exhibits no discharge.  Cardiovascular: Normal rate, regular rhythm and normal heart sounds.   Pulmonary/Chest: Effort normal and breath sounds normal. No respiratory distress. She has no wheezes.  Abdominal: Soft. Bowel sounds are normal. She exhibits no distension. There is no tenderness.  Musculoskeletal:  No midline back tenderness, step off or crepitus. Right and left sided lower back tenderness. No CVA tenderness.    Neurological: She is alert. She exhibits normal muscle tone. Coordination normal.  Equal muscle tone. 5/5 strength in lower extremities. Diminished DTRs but equal bilaterally.Negative straight leg test. Antalgic gait with assistance, steady.   Skin: Skin is warm and dry. She is not diaphoretic.  Nursing note and vitals reviewed.   ED Course  Procedures (including critical care time) Labs Review Labs Reviewed - No data to display  Imaging Review No results found.   EKG Interpretation None      MDM   Final diagnoses:  Muscle spasm   Patient with muscle spasms in back, arms and legs as well as back pain. No loss of bowel or bladder control. No saddle anesthesia. No fever, night sweats, weight loss, h/o cancer, IVDU. VSS. No neurological deficits and normal neuro exam. Patient can walk but states is painful. No concern for cauda equina.  RICE protocol and pain medicine indicated  and discussed with patient. Driving and sedation precautions provided. Discussed the importance of oral intake as well as taking all her medications as prescribed. Pain managed in ED. Patient is afebrile,  nontoxic, and in no acute distress. Patient is appropriate for outpatient management and is stable for discharge. Pt to follow up with her PCP.  Discussed return precautions with patient. Discussed all results and patient verbalizes understanding and agrees with plan.      Pura Spice, PA-C 09/18/14 1811  Carmin Muskrat, MD 09/19/14 [phone]

## 2014-09-18 NOTE — ED Notes (Signed)
Pt c/o generalized body spasms onset 0200 today. Pt tearful in triage. Pt took tramadol at 1200 today and muscle relaxer last night all without relief.

## 2014-09-29 ENCOUNTER — Ambulatory Visit: Payer: Medicare HMO | Admitting: Pharmacist Clinician (PhC)/ Clinical Pharmacy Specialist

## 2014-10-22 ENCOUNTER — Encounter: Payer: Self-pay | Admitting: Pharmacist Clinician (PhC)/ Clinical Pharmacy Specialist

## 2014-10-22 ENCOUNTER — Ambulatory Visit (INDEPENDENT_AMBULATORY_CARE_PROVIDER_SITE_OTHER): Payer: Commercial Managed Care - HMO | Admitting: Pharmacist Clinician (PhC)/ Clinical Pharmacy Specialist

## 2014-10-22 VITALS — BP 162/84 | HR 88 | Ht 64.0 in | Wt 313.4 lb

## 2014-10-22 DIAGNOSIS — I10 Essential (primary) hypertension: Secondary | ICD-10-CM

## 2014-10-22 DIAGNOSIS — E785 Hyperlipidemia, unspecified: Secondary | ICD-10-CM

## 2014-10-22 MED ORDER — AMLODIPINE BESYLATE 10 MG PO TABS: 10.0000 mg | ORAL_TABLET | Freq: Every day | ORAL | Status: DC

## 2014-10-22 MED ORDER — FENOFIBRATE 145 MG PO TABS: 145.0000 mg | ORAL_TABLET | Freq: Every day | ORAL | Status: DC

## 2014-10-22 NOTE — Progress Notes (Signed)
10/22/2014 [name] [name] Feb 15, 1951 [id number]   HPI:  [name] [name] is a 64 y.o. female patient of Dr [name] [name], with a PMH below who presents today for hypertension clinic evaluation.  Her non-cardiac history is significant for a hip replacement this past November.  She is feeling much better, with no pain and can now move more freely.   Also of note in her chart, she has been taking gemfibrozil and crestor, and does complain of muscle pains in her legs today.  Cardiac Hx: hypertension, hyperlipidemia, diabetes (last A1c was 7 per patient), high risk Myoview stress test (06/2014) with anterolateral ischemia, 50 pack year smoker  Family Hx: mother died of MI at age 84, son has hypertension also  Social Hx: states that 1 pack cigarettes lasts her 2-3 days, drinks only rare alcohol, mostly diet caffeine free soda, some coffee  Diet: states she was used to baking her meats, trying to improve her diet, but her ex-husband has been cooking for her since her hip replacement, he tends to fry foods.  Exercise: none prior to surgery, now just getting to point where she can move freely.  Home BP readings: highes 202/100, mostly in 150-160s, did not bring cuff  Current antihypertensive medications: losartan 100mg , HCTZ 12.5 mg, amlodipine 5 mg   Current Outpatient Prescriptions  Medication Sig Dispense Refill  . allopurinol (ZYLOPRIM) 100 MG tablet Take 100 mg by mouth every morning.     Marland Kitchen amLODipine (NORVASC) 5 MG tablet Take 1 tablet (5 mg total) by mouth daily. 30 tablet 6  . budesonide-formoterol (SYMBICORT) 160-4.5 MCG/ACT inhaler Inhale 2 puffs into the lungs 2 (two) times daily.    . clopidogrel (PLAVIX) 75 MG tablet Take 1 tablet by mouth daily.  8  . diazepam (VALIUM) 5 MG tablet Take 1 tablet (5 mg total) by mouth 2 (two) times daily. 10 tablet 0  . esomeprazole (NEXIUM) 40 MG capsule Take 40 mg by mouth every morning.    . fluticasone (FLONASE) 50 MCG/ACT nasal spray Place 2  sprays into the nose as needed for allergies.     . furosemide (LASIX) 40 MG tablet Take 40 mg by mouth at bedtime.     Marland Kitchen gemfibrozil (LOPID) 600 MG tablet Take 600 mg by mouth 2 (two) times daily before a meal.    . hydrochlorothiazide (,MICROZIDE/HYDRODIURIL,) 12.5 MG capsule Take 12.5 mg by mouth every morning.     . insulin aspart protamine-insulin aspart (NOVOLOG 70/30) (70-30) 100 UNIT/ML injection Inject 30-40 Units into the skin 2 (two) times daily with a meal. 40 units in the morning and 30 units in the evening.    Marland Kitchen KLOR-CON M20 20 MEQ tablet Take 20 mEq by mouth at bedtime.     Marland Kitchen levothyroxine (SYNTHROID, LEVOTHROID) 137 MCG tablet Take 137 mcg by mouth daily before breakfast.    . losartan (COZAAR) 100 MG tablet Take 1 tablet (100 mg total) by mouth every morning. 90 tablet 3  . methocarbamol (ROBAXIN) 500 MG tablet Take 1 tablet (500 mg total) by mouth every 6 (six) hours as needed for muscle spasms. 90 tablet 0  . naftifine (NAFTIN) 1 % cream Apply 1 application topically daily as needed (rash).    . ONE TOUCH ULTRA TEST test strip 1 each by Other route 3 (three) times daily as needed (testing blood sugar.).     Marland Kitchen polyvinyl alcohol (LIQUIFILM TEARS) 1.4 % ophthalmic solution Place 2 drops into both eyes daily as needed  for dry eyes.    . rosuvastatin (CRESTOR) 10 MG tablet Take 10 mg by mouth at bedtime.    . traMADol (ULTRAM) 50 MG tablet Take 1-2 tablets (50-100 mg total) by mouth every 6 (six) hours as needed (mild pain). 60 tablet 1  . VESICARE 10 MG tablet Take 10 mg by mouth every morning.      No current facility-administered medications for this visit.    Allergies  Allergen Reactions  . Ace Inhibitors     Tongue swell  . Morphine And Related Itching    Past Medical History  Diagnosis Date  . History of hip replacement, total   . Hx MRSA infection     abscess left groin  . Arthritis   . GERD (gastroesophageal reflux disease)   . Gout   . Diabetes mellitus   .  Hypertension   . Thyroid disease   . Hyperlipidemia   . TIA (transient ischemic attack)      X2 NO RESIDUAL PROBLEMS  . Positive cardiac stress test 12/2013    anterior and lateral ischemia on Myoview  . Hx of cardiac cath 06/2014  . Asthma   . Headache   . Loosening of prosthetic hip   . Cramping of feet   . Stress incontinence   . History of transfusion   . Sleep apnea     UNABLE TO TOLERATE C PAP    Blood pressure 162/84, pulse 88, height 5\' 4"  (1.626 m), weight 313 lb 6.4 oz (142.157 kg).    Tommy Medal PharmD CPP [facility]

## 2014-10-22 NOTE — Assessment & Plan Note (Signed)
Her blood pressure remains elevated today, despite the addition of amlodipine 5mg .  She takes all 3 blood pressure medications in the mornings.  I have asked her to increase the amlodipine to 10 mg and switch it to be with her nighttime meds.  She is to continue with home BP checks several times each week and I will see her back in another month for follow up

## 2014-10-22 NOTE — Patient Instructions (Signed)
Return for a a follow up appointment in 1 month  Your blood pressure today is 162/84  Check your blood pressure at home daily (if able) and keep record of the readings.  Take your BP meds as follows: increase amlodipine to 10 mg once daily,  Take in the evenings  Bring all of your meds, your BP cuff and your record of home blood pressures to your next appointment.  Exercise as you're able, try to walk approximately 30 minutes per day.  Keep salt intake to a minimum, especially watch canned and prepared boxed foods.  Eat more fresh fruits and vegetables and fewer canned items.  Avoid eating in fast food restaurants.    HOW TO TAKE YOUR BLOOD PRESSURE: . Rest 5 minutes before taking your blood pressure. .  Don't smoke or drink caffeinated beverages for at least 30 minutes before. . Take your blood pressure before (not after) you eat. . Sit comfortably with your back supported and both feet on the floor (don't cross your legs). . Elevate your arm to heart level on a table or a desk. . Use the proper sized cuff. It should fit smoothly and snugly around your bare upper arm. There should be enough room to slip a fingertip under the cuff. The bottom edge of the cuff should be 1 inch above the crease of the elbow. . Ideally, take 3 measurements at one sitting and record the average.   Stop your gemfibrozil now.  Start the fenofibrate on February 1.  If you continue to have muscle aches/pains, please give me a call.  Erasmo Downer at [phone]

## 2014-10-22 NOTE — Assessment & Plan Note (Signed)
Pt has been on gemfibrozil, which is contraindicated with her Crestor.  She does complain of muscle aches/pain in her thighs and calves.  I have asked that she stop the gemfibrozil completely and explained the interaction to her.  I will send in a prescription for fenofibrate 145 mg for her to replace it.  I did ask that she wait about two weeks before starting the new medication, to let her muscles improve.

## 2014-11-19 ENCOUNTER — Ambulatory Visit: Payer: Medicare HMO | Admitting: Pharmacist Clinician (PhC)/ Clinical Pharmacy Specialist

## 2014-11-22 ENCOUNTER — Ambulatory Visit: Payer: Medicaid Other | Admitting: Neurology

## 2014-11-22 ENCOUNTER — Ambulatory Visit: Payer: PRIVATE HEALTH INSURANCE | Admitting: Nurse Practitioner

## 2014-12-15 ENCOUNTER — Ambulatory Visit (INDEPENDENT_AMBULATORY_CARE_PROVIDER_SITE_OTHER): Payer: Commercial Managed Care - HMO | Admitting: Pharmacist Clinician (PhC)/ Clinical Pharmacy Specialist

## 2014-12-15 VITALS — BP 150/68 | Ht 64.0 in | Wt 312.9 lb

## 2014-12-15 DIAGNOSIS — I10 Essential (primary) hypertension: Secondary | ICD-10-CM

## 2014-12-15 NOTE — Patient Instructions (Signed)
Return for a a follow up appointment in 2 weeks  Your blood pressure today is 150/68  Check your blood pressure at home 3-4 times each week and keep record of the readings.  Take your BP meds as follows: continue all medicines EXCEPT PLEASE STOP AMLODIPINE  Continue with your fenofibrate, but leave off the Crestor for now.  Repeat your cholesterol labs in 1 week  Bring all of your meds, your BP cuff and your record of home blood pressures to your next appointment.  Exercise as you're able, try to walk approximately 30 minutes per day.  Keep salt intake to a minimum, especially watch canned and prepared boxed foods.  Eat more fresh fruits and vegetables and fewer canned items.  Avoid eating in fast food restaurants.    HOW TO TAKE YOUR BLOOD PRESSURE: . Rest 5 minutes before taking your blood pressure. .  Don't smoke or drink caffeinated beverages for at least 30 minutes before. . Take your blood pressure before (not after) you eat. . Sit comfortably with your back supported and both feet on the floor (don't cross your legs). . Elevate your arm to heart level on a table or a desk. . Use the proper sized cuff. It should fit smoothly and snugly around your bare upper arm. There should be enough room to slip a fingertip under the cuff. The bottom edge of the cuff should be 1 inch above the crease of the elbow. . Ideally, take 3 measurements at one sitting and record the average.

## 2014-12-16 ENCOUNTER — Encounter: Payer: Self-pay | Admitting: Pharmacist Clinician (PhC)/ Clinical Pharmacy Specialist

## 2014-12-16 NOTE — Progress Notes (Signed)
12/16/2014 [name] [name] [birth year] [id number]   HPI:  [name] [name] is a 64 y.o. female patient of Dr Gwenlyn Found, with a PMH below who presents today for a return hypertension clinic visit.  Her non-cardiac history is significant for a hip replacement this past November.  When I last saw her, BP was elevated at 162/84 and I increased the amlodipine from 5 mg to 10 mg daily.  Since then she has noted an increase in swelling/edema, to the point that she is uncomfortable and hurting much of the time.  She says the fluid pills (hctz, furosemide) seem to work, but every morning the swelling returns.  She saw her PCP recently, with these symptoms, but apparently no changes were made, although some labs were drawn.  We will try to get copies of these.      Cardiac Hx: hypertension, hyperlipidemia, diabetes (last A1c was 7 per patient), high risk Myoview stress test (06/2014) with anterolateral ischemia, 50 pack year smoker  Family Hx: mother died of MI at age 96, son has hypertension also  Social Hx: states that 1 pack cigarettes lasts her 2-3 days, drinks only rare alcohol, mostly diet caffeine free soda, some coffee  Diet: states she was used to baking her meats, trying to improve her diet, but her ex-husband has been cooking for her since her hip replacement, he tends to fry foods.  Exercise: none prior to surgery, now just getting to point where she can move freely.  Has not checked her home BP much, as she has been "too miserable"  Current antihypertensive medications: losartan 100mg , HCTZ 12.5 mg, amlodipine 10 mg   Current Outpatient Prescriptions  Medication Sig Dispense Refill  . allopurinol (ZYLOPRIM) 100 MG tablet Take 100 mg by mouth every morning.     Marland Kitchen amLODipine (NORVASC) 10 MG tablet Take 1 tablet (10 mg total) by mouth daily. 90 tablet 1  . budesonide-formoterol (SYMBICORT) 160-4.5 MCG/ACT inhaler Inhale 2 puffs into the lungs 2 (two) times daily.    . clopidogrel  (PLAVIX) 75 MG tablet Take 1 tablet by mouth daily.  8  . diazepam (VALIUM) 5 MG tablet Take 1 tablet (5 mg total) by mouth 2 (two) times daily. 10 tablet 0  . esomeprazole (NEXIUM) 40 MG capsule Take 40 mg by mouth every morning.    . fenofibrate (TRICOR) 145 MG tablet Take 1 tablet (145 mg total) by mouth daily. 90 tablet 1  . fluticasone (FLONASE) 50 MCG/ACT nasal spray Place 2 sprays into the nose as needed for allergies.     . furosemide (LASIX) 40 MG tablet Take 40 mg by mouth at bedtime.     . hydrochlorothiazide (,MICROZIDE/HYDRODIURIL,) 12.5 MG capsule Take 12.5 mg by mouth every morning.     . insulin aspart protamine-insulin aspart (NOVOLOG 70/30) (70-30) 100 UNIT/ML injection Inject 30-40 Units into the skin 2 (two) times daily with a meal. 40 units in the morning and 30 units in the evening.    Marland Kitchen KLOR-CON M20 20 MEQ tablet Take 20 mEq by mouth at bedtime.     Marland Kitchen levothyroxine (SYNTHROID, LEVOTHROID) 137 MCG tablet Take 137 mcg by mouth daily before breakfast.    . losartan (COZAAR) 100 MG tablet Take 1 tablet (100 mg total) by mouth every morning. 90 tablet 3  . methocarbamol (ROBAXIN) 500 MG tablet Take 1 tablet (500 mg total) by mouth every 6 (six) hours as needed for muscle spasms. 90 tablet 0  . naftifine (NAFTIN)  1 % cream Apply 1 application topically daily as needed (rash).    . ONE TOUCH ULTRA TEST test strip 1 each by Other route 3 (three) times daily as needed (testing blood sugar.).     Marland Kitchen polyvinyl alcohol (LIQUIFILM TEARS) 1.4 % ophthalmic solution Place 2 drops into both eyes daily as needed for dry eyes.    . rosuvastatin (CRESTOR) 10 MG tablet Take 10 mg by mouth at bedtime.    . traMADol (ULTRAM) 50 MG tablet Take 1-2 tablets (50-100 mg total) by mouth every 6 (six) hours as needed (mild pain). 60 tablet 1  . VESICARE 10 MG tablet Take 10 mg by mouth every morning.      No current facility-administered medications for this visit.    Allergies  Allergen Reactions  .  Ace Inhibitors     Tongue swell  . Morphine And Related Itching    Past Medical History  Diagnosis Date  . History of hip replacement, total   . Hx MRSA infection     abscess left groin  . Arthritis   . GERD (gastroesophageal reflux disease)   . Gout   . Diabetes mellitus   . Hypertension   . Thyroid disease   . Hyperlipidemia   . TIA (transient ischemic attack)      X2 NO RESIDUAL PROBLEMS  . Positive cardiac stress test 12/2013    anterior and lateral ischemia on Myoview  . Hx of cardiac cath 06/2014  . Asthma   . Headache   . Loosening of prosthetic hip   . Cramping of feet   . Stress incontinence   . History of transfusion   . Sleep apnea     UNABLE TO TOLERATE C PAP    Blood pressure 150/68, height 5\' 4"  (1.626 m), weight 312 lb 14.4 oz (141.931 kg).    Tommy Medal PharmD CPP [facility]

## 2014-12-16 NOTE — Assessment & Plan Note (Signed)
Edema may be caused by increase in amlodipine, will have patient discontinue med today.  She was tearful in the office, but felt better after I explained that this may be the cause of her discomfort.  Her BP was actually better than has been in awhile, at 150/68.  I am stopping amlodipine, but not starting anything else, as need to determine if this is the cause of edema.  I will see her back in 2 weeks.  Advised her to call the office in 4-5 days if the edema does not resolve.  Otherwise I will see her in 2 weeks to determine what the next step in BP management needs to be.

## 2014-12-23 ENCOUNTER — Ambulatory Visit: Payer: Commercial Managed Care - HMO | Admitting: Neurology

## 2014-12-29 ENCOUNTER — Telehealth: Payer: Self-pay | Admitting: Pharmacist Clinician (PhC)/ Clinical Pharmacy Specialist

## 2014-12-31 ENCOUNTER — Ambulatory Visit: Payer: Commercial Managed Care - HMO | Admitting: Pharmacist Clinician (PhC)/ Clinical Pharmacy Specialist

## 2014-12-31 NOTE — Telephone Encounter (Signed)
Pt states feeling much better now off amlodipine.  Swelling has decreased and she states home BP readings all WNL  Would like to cancel appt and will call if she has further problems.

## 2015-01-19 ENCOUNTER — Encounter: Payer: Self-pay | Admitting: Neurology

## 2015-01-19 ENCOUNTER — Telehealth: Payer: Self-pay | Admitting: *Deleted

## 2015-01-19 ENCOUNTER — Ambulatory Visit (INDEPENDENT_AMBULATORY_CARE_PROVIDER_SITE_OTHER): Payer: Commercial Managed Care - HMO | Admitting: Neurology

## 2015-01-19 VITALS — BP 156/77 | HR 96 | Ht 64.0 in | Wt 305.0 lb

## 2015-01-19 DIAGNOSIS — G459 Transient cerebral ischemic attack, unspecified: Secondary | ICD-10-CM

## 2015-01-19 DIAGNOSIS — E1149 Type 2 diabetes mellitus with other diabetic neurological complication: Secondary | ICD-10-CM

## 2015-01-19 DIAGNOSIS — I10 Essential (primary) hypertension: Secondary | ICD-10-CM

## 2015-01-19 NOTE — Telephone Encounter (Signed)
No showed appt today.

## 2015-01-19 NOTE — Progress Notes (Signed)
PATIENT: [name] [name] DOB: 05-14-1951  HISTORICAL  [name] [name] is a 64 years old AA female, referred by her primary care physician Dr. [name] for evaluation of left arm difficulty, initial visit March 18th 2015.  She has PMHx of obesity, HTN, DM, insulin-dependent, presented with recurrent episode of left arm difficulty  About 3 months ago, in December 2014, while sitting down watching TV, she felt left facial droopy, she massaged it for few minutes, gradually subsided,  2 months ago, in January 2015, while folding her cloth, reaching down, she noticed left arm weakness, has difficulty lifting it up against gravity, the blood pressure was 60/50s,  when she checked on it, her symptoms lost about few minutes, gradually subsided  The second episode was a few weeks ago, she was standing on, felt fainting sensation, her left arm went limp, she has difficulty moving her left fourth and fifth fingers, lasting for 5 minutes, recovered after she lied down resting  Today, March 18th 2015, on her way here, around 9:30 AM, she felt her heart was acting out, irregular heart rate, feeling weird, lasting for a few minutes, she felt again left arm a little bit heavy, clumsy, she can still raise her left arm against gravity without difficulty, she has mild gait difficulty due to her right hip pain,    She had a history of right hip replacement. She denies significant chest pain, no shortness of breath  UPDATE April 20th 2015;  Ultrasound of carotid artery in January 2015: has demonstrate bilateral 1-39% internal carotid artery stenosis  Echocardiogram: Left ventricle: The cavity size was normal. There was moderate concentric hypertrophy. Systolic function was mildly reduced. The estimated ejection fraction was in the range of 45% to 50%. Possible hypokinesis of the lateral myocardium. Doppler parameters are consistent with abnormal left ventricular relaxation   She also had cardiac monitoring  March 25th to 27th, 2015, which has demonstrated sinus bradycardia to sinus tachycardia, with heart rate ranging from 49-140 eats per minute, first degree AV block, recurrence of the brought T-wave, there was supraventricular ectopy, 222 single ventricular ectopy, she was made appointments with his cardiologist Dr. Gena Fray in May 8th 2015.  She also reported 2 weeks history of heart beating loud tinnitus since early April 2015, she complained of dark black stool, asa was taken off by her PCP Dr. Rhys Martini, per patient, stool occult blood was negative,  now she has no black stool anymore, she is still taking Plavix 75 mg a day, before she came to see me in March 2015,  she was taking aspirin 81 mg daily, I have added Plavix to her treatment. 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  MRA of the neck was normal, patent bilateral carotid, and vertebral arteries.  Laboratory evaluation showed normal or negative L89, folic acid, RPR, ANA, CMP, with exception of mild elevated creatinine 1.3 8, mild elevated WBC 13.7, MCV 78, Decreased LDL 119, normal CK-MB,  UPDATE January 19 2015: She has right hip replacement in Nov 2015, reported recent normal cardiac cath by  Dr. Gwenlyn Found, recent adjustment of her BP medications, swelling, lasix has helped. No stroke like symptoms, no recurrent episode of stroke like symptoms  REVIEW OF SYSTEMS: Full 14 system review of systems performed and notable only for runny nose, blurred  vision, cough, wheezing, leg swelling, excessive thirst, abdominal pain, bladder incontinence, urgency, joint pain, swelling, back pain, achy muscles, muscle cramps, walking difficulty, bruise easily   ALLERGIES: Allergies  Allergen Reactions  . Ace Inhibitors     Tongue swell  . Morphine And Related Itching    HOME MEDICATIONS: Current Outpatient Prescriptions on File Prior to Visit  Medication Sig Dispense Refill  . albuterol (PROVENTIL HFA;VENTOLIN HFA) 108 (90 BASE) MCG/ACT inhaler Inhale 2 puffs into the lungs every 6 (six) hours as needed for wheezing or shortness of breath.      . allopurinol (ZYLOPRIM) 100 MG tablet Take 100 mg by mouth daily.        . B Complex-C (B-COMPLEX WITH VITAMIN C) tablet Take 1 tablet by mouth 2 (two) times daily.      Marland Kitchen BENICAR 40 MG tablet       . cilostazol (PLETAL) 100 MG tablet       . esomeprazole (NEXIUM) 40 MG capsule Take 40 mg by mouth daily before breakfast.        . fluticasone (FLONASE) 50 MCG/ACT nasal spray Place 2 sprays into the nose as needed for allergies.       . furosemide (LASIX) 40 MG tablet       . hydrochlorothiazide (,MICROZIDE/HYDRODIURIL,) 12.5 MG capsule Take 12.5 mg by mouth daily.        . insulin aspart protamine-insulin aspart (NOVOLOG 70/30) (70-30) 100 UNIT/ML injection Inject into the skin 2 (two) times daily with a meal.        . KLOR-CON M20 20 MEQ tablet       . levothyroxine (SYNTHROID, LEVOTHROID) 150 MCG tablet Take 150 mcg by mouth daily.        . meloxicam (MOBIC) 15 MG tablet Take 15 mg by mouth daily.        . Multiple Vitamin (MULTIVITAMIN WITH MINERALS) TABS Take 1 tablet by mouth daily.      Marland Kitchen oxyCODONE-acetaminophen (PERCOCET) 10-325 MG per tablet Take 1 tablet by mouth every 6 (six) hours as needed for pain.  20 tablet  0  . VESICARE 10 MG tablet          PAST MEDICAL HISTORY: Past Medical History  Diagnosis Date  . History of hip replacement, total right 2010, revise in 2012.   Marland Kitchen Hx MRSA  infection     abscess left groin  . Arthritis   . GERD (gastroesophageal reflux disease)   . Gout   . Diabetes mellitus since 2008   . Hypertension   . Thyroid disease     PAST SURGICAL HISTORY: Past Surgical History  Procedure Laterality Date  . Thyroidectomy    . Abdominal hysterectomy    . Ventral hernia repair    . Laparoscopic cholecystectomy    . Forearm fracture surgery      Left arm  . Joint replacement    . Hernia repair      w/ mesh  . Incision and drainage abscess Left 02/04/2013    Procedure: INCISION AND DRAINAGE LEFT BUTTOCK ABSCESS; INCISION AND DRAINAGE LEFT BREAST ABSCESS;  Surgeon: Harl Bowie, MD;  Location: [place];  Service: General;  Laterality: Left;  . Irrigation and debridement abscess N/A 02/06/2013  Procedure: IRRIGATION AND DEBRIDEMENT BUTTOCK ABSCESS AND DRESSING CHANGE;  Surgeon: Harl Bowie, MD;  Location: [place];  Service: General;  Laterality: N/A;  . Irrigation and debridement abscess Left 02/08/2013    Procedure: IRRIGATION AND DEBRIDEMENT ABSCESS/DRESSING CHANGE;  Surgeon: Gwenyth Ober, MD;  Location: [place];  Service: General;  Laterality: Left;  . Incision and drainage perirectal abscess Left 02/10/2013    Procedure: IRRIGATION AND DEBRIDEMENT OF BUTTOCK/PERINEAL ABSCESS;  Surgeon: [name]. Georgette Dover, MD;  Location: [place];  Service: General;  Laterality: Left;  . Incision and drainage abscess N/A 02/12/2013    Procedure: INCISION AND DEBRIDEMENT BUTTOCK WOUND ;  Surgeon: [name]. Georgette Dover, MD;  Location: [place];  Service: General;  Laterality: N/A;  . Incision and drainage abscess N/A 02/14/2013    Procedure: INCISION AND DRAINAGE/DRESSING CHANGE;  Surgeon: Harl Bowie, MD;  Location: [place];  Service: General;  Laterality: N/A;  . Incision and drainage perirectal abscess N/A 02/16/2013    Procedure: IRRIGATION AND DEBRIDEMENT PERINEAL ABSCESS;  Surgeon: Zenovia Jarred, MD;  Location: [place];  Service: General;  Laterality: N/A;     FAMILY HISTORY: Family History  Problem Relation Age of Onset  . Diabetes Brother   . Cardiomyopathy Mother     SOCIAL HISTORY:  History   Social History  . Marital Status: Married    Spouse Name: N/A    Number of Children: 1  . Years of Education: college   Occupational History    Disabled   Social History Main Topics  . Smoking status: Current Every Day Smoker -- 0.25 packs/day for 40 years    Types: Cigarettes  . Smokeless tobacco: Never Used  . Alcohol Use: 0.5 oz/week    1 drink(s) per week     Comment: OCC  . Drug Use: No     Comment: Quit four years ago.  Marland Kitchen Sexual Activity: Not on file   Other Topics Concern  . Not on file   Social History Narrative   Patient is separated  and lives at home with her friend. Patient is disabled.   Education one year of college   Right handed   Caffeine two cups daily.         PHYSICAL EXAM   There were no vitals filed for this visit.  Not recorded      There is no weight on file to calculate BMI.  PHYSICAL EXAMNIATION:  Gen: NAD, conversant, well nourised, obese, well groomed                     Cardiovascular: Regular rate rhythm, no peripheral edema, warm, nontender. Eyes: Conjunctivae clear without exudates or hemorrhage Neck: Supple, no carotid bruise. Pulmonary: Clear to auscultation bilaterally   NEUROLOGICAL EXAM:  MENTAL STATUS: Speech:    Speech is normal; fluent and spontaneous with normal comprehension.  Cognition:    The patient is oriented to person, place, and time;     recent and remote memory intact;     language fluent;     normal attention, concentration,     fund of knowledge.  CRANIAL NERVES: CN II: Visual fields are full to confrontation. Fundoscopic exam is normal with sharp discs and no vascular changes. Venous pulsations are present bilaterally. Pupils are 4 mm and briskly reactive to light. Visual acuity is 20/20 bilaterally. CN III, IV, VI: extraocular movement are normal.  No ptosis. CN V: Facial sensation is intact to pinprick in all 3  divisions bilaterally. Corneal responses are intact.  CN VII: Face is symmetric with normal eye closure and smile. CN VIII: Hearing is normal to rubbing fingers CN IX, X: Palate elevates symmetrically. Phonation is normal. CN XI: Head turning and shoulder shrug are intact CN XII: Tongue is midline with normal movements and no atrophy.  MOTOR: There is no pronator drift of out-stretched arms. Muscle bulk and tone are normal. Muscle strength is normal.   Shoulder abduction Shoulder external rotation Elbow flexion Elbow extension Wrist flexion Wrist extension Finger abduction Hip flexion Knee flexion Knee extension Ankle dorsi flexion Ankle plantar flexion  R 5 5 5 5 5 5 5 5 5 5 5 5   L 5 5 5 5 5 5 5 5 5 5 5 5     REFLEXES: Reflexes are 2+ and symmetric at the biceps, triceps, knees, and ankles. Plantar responses are flexor.  SENSORY: Light touch, pinprick, position sense, and vibration sense are intact in fingers and toes.  COORDINATION: Rapid alternating movements and fine finger movements are intact. There is no dysmetria on finger-to-nose and heel-knee-shin. There are no abnormal or extraneous movements.   GAIT/STANCE: Obese, antaglic, cautious gait Romberg is absent.  DIAGNOSTIC DATA (LABS, IMAGING, TESTING) - I reviewed patient records, labs, notes, testing and imaging myself where available.  Lab Results  Component Value Date   WBC 11.0* 08/13/2014   HGB 8.8* 08/13/2014   HCT 28.7* 08/13/2014   MCV 85.7 08/13/2014   PLT 283 08/13/2014      Component Value Date/Time   NA 141 08/13/2014 0500   NA 146* 12/23/2013 1132   K 3.7 08/13/2014 0500   CL 102 08/13/2014 0500   CO2 26 08/13/2014 0500   GLUCOSE 188* 08/13/2014 0500   GLUCOSE 86 12/23/2013 1132   BUN 38* 08/13/2014 0500   BUN 31* 12/23/2013 1132   CREATININE 1.36* 08/13/2014 0500   CREATININE 1.32* 06/29/2014 1100   CALCIUM 9.1 08/13/2014 0500    PROT 7.7 08/11/2014 2112   PROT 7.3 12/23/2013 1132   ALBUMIN 3.3* 08/11/2014 2112   AST 31 08/11/2014 2112   ALT 20 08/11/2014 2112   ALKPHOS 90 08/11/2014 2112   BILITOT <0.2* 08/11/2014 2112   GFRNONAA 40* 08/13/2014 0500   GFRAA 47* 08/13/2014 0500   Lab Results  Component Value Date   CHOL 188 12/23/2013   HDL 48 12/23/2013   LDLCALC 119* 12/23/2013   TRIG 103 12/23/2013   CHOLHDL 3.9 12/23/2013   Lab Results  Component Value Date   HGBA1C 6.9* 08/11/2014   Lab Results  Component Value Date   VITAMINB12 1058* 12/23/2013   Lab Results  Component Value Date   TSH 0.381 08/12/2014      ASSESSMENT AND PLAN  [name] [name] is a 64 y.o. female  with vascular risk factor of obesity, hypertension, diabetes, hyperlipidemia, presenting with recurrent episode of left arm weakness, MRI confirmed small right frontal stroke, which consistent with her complaints, potential etiology of her stroke including small vessel, vs. cardiac embolic stroke, with her complaints of racing heart, she has developed black stool while taking aspirin, Plavix, and Pletal, per patient, stool occult blood study testing was negative, she is now only taking Plavix, no longer has black stool,  1. she is to continue Plavix 2. Continue to optimize her BP, DM, HLD control 3. Water Aerobic 4. RTC as needed.   Marcial Pacas, M.D. Ph.D.  [facility] [address] [place], [place] [address] [phone]

## 2015-01-19 NOTE — Telephone Encounter (Signed)
Patient showed late for appt and Dr.Yan agreed to see her.

## 2015-02-22 ENCOUNTER — Other Ambulatory Visit: Payer: Self-pay | Admitting: Nephrology

## 2015-02-23 ENCOUNTER — Other Ambulatory Visit: Payer: Self-pay | Admitting: Nephrology

## 2015-02-23 DIAGNOSIS — I159 Secondary hypertension, unspecified: Secondary | ICD-10-CM

## 2015-02-23 DIAGNOSIS — R32 Unspecified urinary incontinence: Secondary | ICD-10-CM

## 2015-02-23 DIAGNOSIS — N189 Chronic kidney disease, unspecified: Secondary | ICD-10-CM

## 2015-02-28 ENCOUNTER — Ambulatory Visit
Admission: RE | Admit: 2015-02-28 | Discharge: 2015-02-28 | Disposition: A | Discharge status: Home / Self Care | Payer: Commercial Managed Care - HMO | Source: Ambulatory Visit | Attending: Nephrology | Admitting: Nephrology

## 2015-02-28 DIAGNOSIS — N189 Chronic kidney disease, unspecified: Secondary | ICD-10-CM

## 2015-02-28 DIAGNOSIS — I159 Secondary hypertension, unspecified: Secondary | ICD-10-CM

## 2015-02-28 DIAGNOSIS — R32 Unspecified urinary incontinence: Secondary | ICD-10-CM

## 2015-03-21 ENCOUNTER — Encounter (HOSPITAL_COMMUNITY)
Admission: EM | Disposition: A | Discharge status: Home Health | Payer: Self-pay | Source: Home / Self Care | Attending: Internal Medicine

## 2015-03-21 ENCOUNTER — Inpatient Hospital Stay (HOSPITAL_COMMUNITY): Payer: Commercial Managed Care - HMO | Admitting: Anesthesiology

## 2015-03-21 ENCOUNTER — Inpatient Hospital Stay (HOSPITAL_COMMUNITY)
Admission: EM | Admit: 2015-03-21 | Discharge: 2015-03-23 | DRG: 603 | Disposition: A | Discharge status: Home Health | Payer: Commercial Managed Care - HMO | Attending: Internal Medicine | Admitting: Internal Medicine

## 2015-03-21 ENCOUNTER — Encounter (HOSPITAL_COMMUNITY): Payer: Self-pay | Admitting: Emergency Medicine

## 2015-03-21 DIAGNOSIS — I739 Peripheral vascular disease, unspecified: Secondary | ICD-10-CM | POA: Diagnosis present

## 2015-03-21 DIAGNOSIS — Z79891 Long term (current) use of opiate analgesic: Secondary | ICD-10-CM | POA: Diagnosis not present

## 2015-03-21 DIAGNOSIS — Z8614 Personal history of Methicillin resistant Staphylococcus aureus infection: Secondary | ICD-10-CM

## 2015-03-21 DIAGNOSIS — Z8673 Personal history of transient ischemic attack (TIA), and cerebral infarction without residual deficits: Secondary | ICD-10-CM | POA: Diagnosis not present

## 2015-03-21 DIAGNOSIS — E1121 Type 2 diabetes mellitus with diabetic nephropathy: Secondary | ICD-10-CM | POA: Diagnosis present

## 2015-03-21 DIAGNOSIS — Z79899 Other long term (current) drug therapy: Secondary | ICD-10-CM | POA: Diagnosis not present

## 2015-03-21 DIAGNOSIS — G4730 Sleep apnea, unspecified: Secondary | ICD-10-CM | POA: Diagnosis present

## 2015-03-21 DIAGNOSIS — Z6841 Body Mass Index (BMI) 40.0 and over, adult: Secondary | ICD-10-CM

## 2015-03-21 DIAGNOSIS — L03311 Cellulitis of abdominal wall: Secondary | ICD-10-CM | POA: Diagnosis present

## 2015-03-21 DIAGNOSIS — L03319 Cellulitis of trunk, unspecified: Secondary | ICD-10-CM

## 2015-03-21 DIAGNOSIS — F17210 Nicotine dependence, cigarettes, uncomplicated: Secondary | ICD-10-CM | POA: Diagnosis present

## 2015-03-21 DIAGNOSIS — I10 Essential (primary) hypertension: Secondary | ICD-10-CM | POA: Diagnosis present

## 2015-03-21 DIAGNOSIS — Z7902 Long term (current) use of antithrombotics/antiplatelets: Secondary | ICD-10-CM

## 2015-03-21 DIAGNOSIS — I129 Hypertensive chronic kidney disease with stage 1 through stage 4 chronic kidney disease, or unspecified chronic kidney disease: Secondary | ICD-10-CM | POA: Diagnosis present

## 2015-03-21 DIAGNOSIS — Z885 Allergy status to narcotic agent status: Secondary | ICD-10-CM | POA: Diagnosis not present

## 2015-03-21 DIAGNOSIS — N184 Chronic kidney disease, stage 4 (severe): Secondary | ICD-10-CM | POA: Diagnosis present

## 2015-03-21 DIAGNOSIS — B9689 Other specified bacterial agents as the cause of diseases classified elsewhere: Secondary | ICD-10-CM | POA: Diagnosis present

## 2015-03-21 DIAGNOSIS — L02211 Cutaneous abscess of abdominal wall: Secondary | ICD-10-CM | POA: Diagnosis present

## 2015-03-21 DIAGNOSIS — K219 Gastro-esophageal reflux disease without esophagitis: Secondary | ICD-10-CM | POA: Diagnosis present

## 2015-03-21 DIAGNOSIS — Z96649 Presence of unspecified artificial hip joint: Secondary | ICD-10-CM | POA: Diagnosis present

## 2015-03-21 DIAGNOSIS — I509 Heart failure, unspecified: Secondary | ICD-10-CM | POA: Diagnosis present

## 2015-03-21 DIAGNOSIS — N179 Acute kidney failure, unspecified: Secondary | ICD-10-CM | POA: Diagnosis present

## 2015-03-21 DIAGNOSIS — N183 Chronic kidney disease, stage 3 (moderate): Secondary | ICD-10-CM | POA: Diagnosis present

## 2015-03-21 DIAGNOSIS — Z794 Long term (current) use of insulin: Secondary | ICD-10-CM | POA: Diagnosis not present

## 2015-03-21 DIAGNOSIS — R21 Rash and other nonspecific skin eruption: Secondary | ICD-10-CM | POA: Diagnosis present

## 2015-03-21 DIAGNOSIS — N189 Chronic kidney disease, unspecified: Secondary | ICD-10-CM

## 2015-03-21 DIAGNOSIS — Z833 Family history of diabetes mellitus: Secondary | ICD-10-CM | POA: Diagnosis not present

## 2015-03-21 DIAGNOSIS — M1990 Unspecified osteoarthritis, unspecified site: Secondary | ICD-10-CM | POA: Diagnosis present

## 2015-03-21 DIAGNOSIS — J45909 Unspecified asthma, uncomplicated: Secondary | ICD-10-CM | POA: Diagnosis present

## 2015-03-21 DIAGNOSIS — D649 Anemia, unspecified: Secondary | ICD-10-CM | POA: Diagnosis present

## 2015-03-21 DIAGNOSIS — E785 Hyperlipidemia, unspecified: Secondary | ICD-10-CM | POA: Diagnosis present

## 2015-03-21 DIAGNOSIS — E039 Hypothyroidism, unspecified: Secondary | ICD-10-CM | POA: Diagnosis present

## 2015-03-21 DIAGNOSIS — E1129 Type 2 diabetes mellitus with other diabetic kidney complication: Secondary | ICD-10-CM

## 2015-03-21 DIAGNOSIS — Z888 Allergy status to other drugs, medicaments and biological substances status: Secondary | ICD-10-CM | POA: Diagnosis not present

## 2015-03-21 DIAGNOSIS — M109 Gout, unspecified: Secondary | ICD-10-CM | POA: Diagnosis present

## 2015-03-21 DIAGNOSIS — E1122 Type 2 diabetes mellitus with diabetic chronic kidney disease: Secondary | ICD-10-CM

## 2015-03-21 HISTORY — PX: INCISION AND DRAINAGE ABSCESS: SHX5864

## 2015-03-21 LAB — COMPREHENSIVE METABOLIC PANEL
ALT: 14 U/L (ref 14–54)
AST: 19 U/L (ref 15–41)
Albumin: 3.3 g/dL — ABNORMAL LOW (ref 3.5–5.0)
Alkaline Phosphatase: 46 U/L (ref 38–126)
Anion gap: 11 (ref 5–15)
BUN: 62 mg/dL — ABNORMAL HIGH (ref 6–20)
CO2: 29 mmol/L (ref 22–32)
Calcium: 9.4 mg/dL (ref 8.9–10.3)
Chloride: 104 mmol/L (ref 101–111)
Creatinine, Ser: 2 mg/dL — ABNORMAL HIGH (ref 0.44–1.00)
GFR calc Af Amer: 29 mL/min — ABNORMAL LOW (ref >60)
GFR calc non Af Amer: 25 mL/min — ABNORMAL LOW (ref >60)
Glucose, Bld: 184 mg/dL — ABNORMAL HIGH (ref 65–99)
Potassium: 3.6 mmol/L (ref 3.5–5.1)
Sodium: 144 mmol/L (ref 135–145)
Total Bilirubin: 0.4 mg/dL (ref 0.3–1.2)
Total Protein: 7.2 g/dL (ref 6.5–8.1)

## 2015-03-21 LAB — CBG MONITORING, ED: Glucose-Capillary: 169 mg/dL — ABNORMAL HIGH (ref 65–99)

## 2015-03-21 LAB — CBC WITH DIFFERENTIAL/PLATELET
Basophils Absolute: 0 10*3/uL (ref 0.0–0.1)
Basophils Relative: 0 % (ref 0–1)
Eosinophils Absolute: 0.1 10*3/uL (ref 0.0–0.7)
Eosinophils Relative: 1 % (ref 0–5)
HCT: 33.5 % — ABNORMAL LOW (ref 36.0–46.0)
Hemoglobin: 10 g/dL — ABNORMAL LOW (ref 12.0–15.0)
Lymphocytes Relative: 15 % (ref 12–46)
Lymphs Abs: 2.1 10*3/uL (ref 0.7–4.0)
MCH: 24.5 pg — ABNORMAL LOW (ref 26.0–34.0)
MCHC: 29.9 g/dL — ABNORMAL LOW (ref 30.0–36.0)
MCV: 82.1 fL (ref 78.0–100.0)
Monocytes Absolute: 0.7 10*3/uL (ref 0.1–1.0)
Monocytes Relative: 5 % (ref 3–12)
Neutro Abs: 11 10*3/uL — ABNORMAL HIGH (ref 1.7–7.7)
Neutrophils Relative %: 79 % — ABNORMAL HIGH (ref 43–77)
Platelets: 339 10*3/uL (ref 150–400)
RBC: 4.08 MIL/uL (ref 3.87–5.11)
RDW: 18.3 % — ABNORMAL HIGH (ref 11.5–15.5)
WBC: 14 10*3/uL — ABNORMAL HIGH (ref 4.0–10.5)

## 2015-03-21 LAB — GLUCOSE, CAPILLARY: Glucose-Capillary: 162 mg/dL — ABNORMAL HIGH (ref 65–99)

## 2015-03-21 SURGERY — INCISION AND DRAINAGE, ABSCESS
Anesthesia: General | Site: Perineum

## 2015-03-21 MED ORDER — NEOSTIGMINE METHYLSULFATE 10 MG/10ML IV SOLN
INTRAVENOUS | Status: AC
  Filled 2015-03-21: qty 1

## 2015-03-21 MED ORDER — LIDOCAINE HCL (CARDIAC) 20 MG/ML IV SOLN
INTRAVENOUS | Status: DC | PRN
  Administered 2015-03-21: 80 mg via INTRAVENOUS

## 2015-03-21 MED ORDER — FENTANYL CITRATE (PF) 250 MCG/5ML IJ SOLN
INTRAMUSCULAR | Status: AC
  Filled 2015-03-21: qty 5

## 2015-03-21 MED ORDER — ALBUTEROL SULFATE HFA 108 (90 BASE) MCG/ACT IN AERS
INHALATION_SPRAY | RESPIRATORY_TRACT | Status: DC | PRN
  Administered 2015-03-21: 2 via RESPIRATORY_TRACT

## 2015-03-21 MED ORDER — ALBUTEROL SULFATE HFA 108 (90 BASE) MCG/ACT IN AERS
INHALATION_SPRAY | RESPIRATORY_TRACT | Status: AC
  Filled 2015-03-21: qty 6.7

## 2015-03-21 MED ORDER — ONDANSETRON HCL 4 MG/2ML IJ SOLN
INTRAMUSCULAR | Status: AC
  Filled 2015-03-21: qty 2

## 2015-03-21 MED ORDER — ALBUTEROL SULFATE (2.5 MG/3ML) 0.083% IN NEBU
INHALATION_SOLUTION | RESPIRATORY_TRACT | Status: AC
  Filled 2015-03-21: qty 3

## 2015-03-21 MED ORDER — LIDOCAINE HCL (CARDIAC) 20 MG/ML IV SOLN
INTRAVENOUS | Status: AC
  Filled 2015-03-21: qty 5

## 2015-03-21 MED ORDER — SODIUM CHLORIDE 0.9 % IV SOLN
INTRAVENOUS | Status: DC | PRN
  Administered 2015-03-21: 21:00:00 via INTRAVENOUS

## 2015-03-21 MED ORDER — PROPOFOL 10 MG/ML IV BOLUS
INTRAVENOUS | Status: DC | PRN
  Administered 2015-03-21: 150 mg via INTRAVENOUS

## 2015-03-21 MED ORDER — ALBUTEROL SULFATE (2.5 MG/3ML) 0.083% IN NEBU
2.5000 mg | INHALATION_SOLUTION | Freq: Once | RESPIRATORY_TRACT | Status: AC
  Administered 2015-03-21: 2.5 mg via RESPIRATORY_TRACT

## 2015-03-21 MED ORDER — PROMETHAZINE HCL 25 MG/ML IJ SOLN: 6.2500 mg | INTRAMUSCULAR | Status: DC | PRN

## 2015-03-21 MED ORDER — SUCCINYLCHOLINE CHLORIDE 20 MG/ML IJ SOLN
INTRAMUSCULAR | Status: DC | PRN
  Administered 2015-03-21: 100 mg via INTRAVENOUS

## 2015-03-21 MED ORDER — PROPOFOL 10 MG/ML IV BOLUS
INTRAVENOUS | Status: AC
  Filled 2015-03-21: qty 20

## 2015-03-21 MED ORDER — FENTANYL CITRATE (PF) 100 MCG/2ML IJ SOLN
INTRAMUSCULAR | Status: DC | PRN
  Administered 2015-03-21: 100 ug via INTRAVENOUS
  Administered 2015-03-21: 150 ug via INTRAVENOUS

## 2015-03-21 MED ORDER — ROCURONIUM BROMIDE 100 MG/10ML IV SOLN
INTRAVENOUS | Status: AC
  Filled 2015-03-21: qty 1

## 2015-03-21 MED ORDER — ONDANSETRON HCL 4 MG/2ML IJ SOLN
INTRAMUSCULAR | Status: DC | PRN
  Administered 2015-03-21: 4 mg via INTRAVENOUS

## 2015-03-21 MED ORDER — ARTIFICIAL TEARS OP OINT
TOPICAL_OINTMENT | OPHTHALMIC | Status: AC
  Filled 2015-03-21: qty 3.5

## 2015-03-21 MED ORDER — PHENYLEPHRINE HCL 10 MG/ML IJ SOLN
INTRAMUSCULAR | Status: DC | PRN
  Administered 2015-03-21 (×2): 80 ug via INTRAVENOUS

## 2015-03-21 MED ORDER — FENTANYL CITRATE (PF) 100 MCG/2ML IJ SOLN
INTRAMUSCULAR | Status: AC
  Filled 2015-03-21: qty 2

## 2015-03-21 MED ORDER — LIDOCAINE-EPINEPHRINE 1 %-1:100000 IJ SOLN
INTRAMUSCULAR | Status: AC
  Filled 2015-03-21: qty 1

## 2015-03-21 MED ORDER — FENTANYL CITRATE (PF) 100 MCG/2ML IJ SOLN
25.0000 ug | INTRAMUSCULAR | Status: DC | PRN
  Administered 2015-03-21: 25 ug via INTRAVENOUS
  Administered 2015-03-21: 50 ug via INTRAVENOUS
  Administered 2015-03-21 (×2): 25 ug via INTRAVENOUS

## 2015-03-21 MED ORDER — 0.9 % SODIUM CHLORIDE (POUR BTL) OPTIME
TOPICAL | Status: DC | PRN
  Administered 2015-03-21: 1000 mL

## 2015-03-21 MED ORDER — VANCOMYCIN HCL IN DEXTROSE 1-5 GM/200ML-% IV SOLN
1000.0000 mg | Freq: Once | INTRAVENOUS | Status: AC
  Administered 2015-03-21: 1000 mg via INTRAVENOUS
  Filled 2015-03-21: qty 200

## 2015-03-21 MED ORDER — GLYCOPYRROLATE 0.2 MG/ML IJ SOLN
INTRAMUSCULAR | Status: AC
  Filled 2015-03-21: qty 2

## 2015-03-21 MED ORDER — SODIUM CHLORIDE 0.9 % IV BOLUS (SEPSIS)
1000.0000 mL | Freq: Once | INTRAVENOUS | Status: AC
  Administered 2015-03-21: 1000 mL via INTRAVENOUS

## 2015-03-21 SURGICAL SUPPLY — 25 items
BNDG GAUZE ELAST 4 BULKY (GAUZE/BANDAGES/DRESSINGS) IMPLANT
DECANTER SPIKE VIAL GLASS SM (MISCELLANEOUS) IMPLANT
DRAPE LAPAROSCOPIC ABDOMINAL (DRAPES) ×2 IMPLANT
DRSG PAD ABDOMINAL 8X10 ST (GAUZE/BANDAGES/DRESSINGS) IMPLANT
ELECT REM PT RETURN 9FT ADLT (ELECTROSURGICAL) ×2
ELECTRODE REM PT RTRN 9FT ADLT (ELECTROSURGICAL) ×1 IMPLANT
GAUZE SPONGE 4X4 12PLY STRL (GAUZE/BANDAGES/DRESSINGS) ×2 IMPLANT
GLOVE BIO SURGEON STRL SZ7.5 (GLOVE) ×2 IMPLANT
GLOVE ECLIPSE 7.5 STRL STRAW (GLOVE) ×2 IMPLANT
GOWN STRL REUS W/TWL XL LVL3 (GOWN DISPOSABLE) ×4 IMPLANT
KIT BASIN OR (CUSTOM PROCEDURE TRAY) ×2 IMPLANT
NEEDLE HYPO 25X1 1.5 SAFETY (NEEDLE) ×2 IMPLANT
NS IRRIG 1000ML POUR BTL (IV SOLUTION) ×2 IMPLANT
PACK GENERAL/GYN (CUSTOM PROCEDURE TRAY) ×2 IMPLANT
PAD ABD 8X10 STRL (GAUZE/BANDAGES/DRESSINGS) ×2 IMPLANT
SPONGE LAP 18X18 X RAY DECT (DISPOSABLE) ×2 IMPLANT
SUT MNCRL AB 4-0 PS2 18 (SUTURE) IMPLANT
SUT VIC AB 3-0 SH 27 (SUTURE)
SUT VIC AB 3-0 SH 27XBRD (SUTURE) IMPLANT
SWAB COLLECTION DEVICE MRSA (MISCELLANEOUS) ×2 IMPLANT
SYR CONTROL 10ML LL (SYRINGE) ×2 IMPLANT
TAPE CLOTH SURG 4X10 WHT LF (GAUZE/BANDAGES/DRESSINGS) ×2 IMPLANT
TOWEL OR 17X26 10 PK STRL BLUE (TOWEL DISPOSABLE) ×2 IMPLANT
TOWEL OR NON WOVEN STRL DISP B (DISPOSABLE) ×2 IMPLANT
TUBE ANAEROBIC SPECIMEN COL (MISCELLANEOUS) ×2 IMPLANT

## 2015-03-21 NOTE — Anesthesia Procedure Notes (Signed)
Procedure Name: Intubation Date/Time: 03/21/2015 9:48 PM Performed by: Bard Haupert, Virgel Gess Pre-anesthesia Checklist: Patient identified, Emergency Drugs available, Suction available, Patient being monitored and Timeout performed Patient Re-evaluated:Patient Re-evaluated prior to inductionOxygen Delivery Method: Circle system utilized Preoxygenation: Pre-oxygenation with 100% oxygen Intubation Type: IV induction Ventilation: Mask ventilation without difficulty Laryngoscope Size: Mac and 4 Grade View: Grade I Tube type: Oral Tube size: 7.5 mm Number of attempts: 1 Airway Equipment and Method: Stylet Placement Confirmation: ETT inserted through vocal cords under direct vision,  positive ETCO2,  CO2 detector and breath sounds checked- equal and bilateral Secured at: 22 cm Tube secured with: Tape Dental Injury: Teeth and Oropharynx as per pre-operative assessment

## 2015-03-21 NOTE — Op Note (Signed)
Preoperative Diagnosis: Abscess and Cellulitis of suprapubic region   Postoprative Diagnosis: Same  Procedure: Procedure(s): INCISION AND DRAINAGE PUBIC ABSCESS   Surgeon: Excell Seltzer T   Assistants: None  Anesthesia:  General endotracheal anesthesia  Indications: Patient is a 64 year old female with comorbidities of morbid obesity and diabetes mellitus with a history of multiple previous skin and soft tissue infections of the groin and thighs. She presents with several days of worsening pain and swelling in the suprapubic area to the left of midline. Examination reveals about a 7 cm area of erythema and induration and central fluctuance. I have recommended incision and drainage under general anesthesia. We discussed the nature the procedure and its indications as well as risks of anesthesia, bleeding, infection and she is in agreement    Procedure Detail:  Patient was brought to the operating room, placed in supine position on the operating table and general endotracheal anesthesia induced. She had received preoperative antibiotics. PAS port in place. The suprapubic area was widely sterilely prepped and draped. Patient timeout was performed and correct procedure verified. I incised into the central area of fluctuance and drained a moderate amount of thick bloody purulent material. Cultures were obtained. I elliptically excised approximately 1.5 x 1 cm area of thinned skin over the abscess cavity and the abscess was widely opened and drained and measured only about 2 cm in diameter with a lot of surrounding induration and cellulitis. No deep tracking out of the subcutaneous tissue and no necrotic tissue. The wound was packed with Betadine gauze. Dry sterile dressing was applied. Sponge needle and instrument counts were correct.    Findings: As above  Estimated Blood Loss:  Minimal         Drains: wound packed open with iodine gauze  Blood Given: none          Specimens: Culture  and sensitivity        Complications:  * No complications entered in OR log *         Disposition: PACU - hemodynamically stable.         Condition: stable

## 2015-03-21 NOTE — Anesthesia Postprocedure Evaluation (Signed)
  Anesthesia Post-op Note  Patient: [name] [name]  Procedure(s) Performed: Procedure(s): INCISION AND DRAINAGE PUBIC ABSCESS (N/A)  Patient Location: PACU  Anesthesia Type:General  Level of Consciousness: awake  Airway and Oxygen Therapy: Patient Spontanous Breathing  Post-op Pain: mild  Post-op Assessment: Post-op Vital signs reviewed              Post-op Vital Signs: Reviewed  Last Vitals:  Filed Vitals:   03/21/15 1904  BP: 121/49  Pulse: 83  Temp: 36.9 C  Resp: 20    Complications: No apparent anesthesia complications

## 2015-03-21 NOTE — ED Notes (Signed)
[name] [name] (husband) would like to be contacted when pt is out of surgery. [phone].

## 2015-03-21 NOTE — Anesthesia Preprocedure Evaluation (Signed)
Anesthesia Evaluation  Patient identified by MRN, date of birth, ID band Patient awake    Reviewed: Allergy & Precautions, NPO status , Patient's Chart, lab work & pertinent test results  Airway Mallampati: II  TM Distance: >3 FB Neck ROM: Full    Dental   Pulmonary asthma , sleep apnea , Current Smoker,  breath sounds clear to auscultation        Cardiovascular hypertension, + Peripheral Vascular Disease and +CHF Rhythm:Regular Rate:Normal     Neuro/Psych    GI/Hepatic Neg liver ROS, GERD-  ,  Endo/Other  diabetesHypothyroidism   Renal/GU Renal disease     Musculoskeletal   Abdominal   Peds  Hematology   Anesthesia Other Findings   Reproductive/Obstetrics                             Anesthesia Physical Anesthesia Plan  ASA: III  Anesthesia Plan: General   Post-op Pain Management:    Induction: Intravenous  Airway Management Planned: Oral ETT  Additional Equipment:   Intra-op Plan:   Post-operative Plan: Possible Post-op intubation/ventilation  Informed Consent: I have reviewed the patients History and Physical, chart, labs and discussed the procedure including the risks, benefits and alternatives for the proposed anesthesia with the patient or authorized representative who has indicated his/her understanding and acceptance.   Dental advisory given  Plan Discussed with: CRNA, Anesthesiologist and Surgeon  Anesthesia Plan Comments:         Anesthesia Quick Evaluation

## 2015-03-21 NOTE — ED Notes (Signed)
Md at bedside

## 2015-03-21 NOTE — H&P (Addendum)
[facility] History and Physical  [name] [name] AVW:[id number] DOB: 1951-07-22 DOA: 03/21/2015  Referring physician: Caryl Ada, PA PCP: [name], MD   Chief Complaint: Abdominal wall infection  HPI: [name] [name] is a 64 y.o. female with history of Diabetes type 2 CKD III hyperlipidemia hypertension hypothyroid Asthma presents with a rash around the suprapubic abdominal wall. Patient is a longstanding diabetic. She states that she has noted overnight appearance of an induratetd area which is very tender around the pubic area and abdominal wall. She states that she has had similar abscesses in the past which were in the groin area and buttocks. She states that she almost lost her leg from one of the abscesses. Patient states that the has felt feverish though she is afebrile here. Patient states she has some nausea no vomiting. She has no diarrhea noted. She has some pain down her leg. Patient states that pain down her leg started before the abscess became visible. Patient states that she has some cough noted and she does admit to smoking. She is bringing up some green sputum. Patient states that she has no headaches and she has some back ache noted.    Review of Systems:  Constitutional:  No weight loss, +night sweats, +chills, +fatigue.  HEENT:  No headaches, itching, ear ache, nasal congestion, post nasal drip,  Cardio-vascular:  No chest pain, PND, +swelling in lower extremities, dizziness  GI:  No heartburn, indigestion, +abdominal pain, +nausea, no vomiting, diarrhea  Resp:  +shortness of breath with exertion. +productive cough,No coughing up of blood.  Skin:  Induration noted with erythema over pubic area  GU:  no dysuria, change in color of urine Musculoskeletal:  No joint pain or swelling. +No back pain.  Psych:  No change in mood or affect. No depression or anxiety  Past Medical History  Diagnosis Date  . History of hip replacement, total   . Hx MRSA  infection     abscess left groin  . Arthritis   . GERD (gastroesophageal reflux disease)   . Gout   . Diabetes mellitus   . Hypertension   . Thyroid disease   . Hyperlipidemia   . TIA (transient ischemic attack)      X2 NO RESIDUAL PROBLEMS  . Positive cardiac stress test 12/2013    anterior and lateral ischemia on Myoview  . Hx of cardiac cath 06/2014  . Asthma   . Headache   . Loosening of prosthetic hip   . Cramping of feet   . Stress incontinence   . History of transfusion   . Sleep apnea     UNABLE TO TOLERATE C PAP   Past Surgical History  Procedure Laterality Date  . Thyroidectomy    . Abdominal hysterectomy    . Laparoscopic cholecystectomy    . Forearm fracture surgery      Left arm  . Hernia repair      w/ mesh  . Incision and drainage abscess Left 02/04/2013    Procedure: INCISION AND DRAINAGE LEFT BUTTOCK ABSCESS; INCISION AND DRAINAGE LEFT BREAST ABSCESS;  Surgeon: Harl Bowie, MD;  Location: [place];  Service: General;  Laterality: Left;  . Irrigation and debridement abscess N/A 02/06/2013    Procedure: IRRIGATION AND DEBRIDEMENT BUTTOCK ABSCESS AND DRESSING CHANGE;  Surgeon: Harl Bowie, MD;  Location: [place];  Service: General;  Laterality: N/A;  . Irrigation and debridement abscess Left 02/08/2013    Procedure: IRRIGATION AND DEBRIDEMENT ABSCESS/DRESSING CHANGE;  Surgeon:  Gwenyth Ober, MD;  Location: [place];  Service: General;  Laterality: Left;  . Incision and drainage perirectal abscess Left 02/10/2013    Procedure: IRRIGATION AND DEBRIDEMENT OF BUTTOCK/PERINEAL ABSCESS;  Surgeon: [name]. Georgette Dover, MD;  Location: [place];  Service: General;  Laterality: Left;  . Incision and drainage abscess N/A 02/12/2013    Procedure: INCISION AND DEBRIDEMENT BUTTOCK WOUND ;  Surgeon: [name]. Georgette Dover, MD;  Location: [place];  Service: General;  Laterality: N/A;  . Incision and drainage abscess N/A 02/14/2013    Procedure: INCISION AND DRAINAGE/DRESSING CHANGE;  Surgeon:  Harl Bowie, MD;  Location: [place];  Service: General;  Laterality: N/A;  . Incision and drainage perirectal abscess N/A 02/16/2013    Procedure: IRRIGATION AND DEBRIDEMENT PERINEAL ABSCESS;  Surgeon: Zenovia Jarred, MD;  Location: [place];  Service: General;  Laterality: N/A;  . Joint replacement  2010 / 2012   . Colon surgery  1995    DUE TO POLYP  . Total hip revision Right 08/11/2014    Procedure: RIGHT ACETABULAR REVISION;  Surgeon: Gearlean Alf, MD;  Location: [facility];  Service: Orthopedics;  Laterality: Right;  . Left heart catheterization with coronary angiogram N/A 07/02/2014    Procedure: LEFT HEART CATHETERIZATION WITH CORONARY ANGIOGRAM;  Surgeon: Lorretta Harp, MD;  Location: [facility] CATH LAB;  Service: Cardiovascular;  Laterality: N/A;   Social History:  reports that she has been smoking Cigarettes.  She has a 10 pack-year smoking history. She has never used smokeless tobacco. She reports that she drinks about 0.5 oz of alcohol per week. She reports that she does not use illicit drugs.  Allergies  Allergen Reactions  . Ace Inhibitors     Tongue swell  . Morphine And Related Itching    Family History  Problem Relation Age of Onset  . Diabetes Brother   . Cardiomyopathy Mother      Prior to Admission medications   Medication Sig Start Date End Date Taking? Authorizing Provider  allopurinol (ZYLOPRIM) 100 MG tablet Take 100 mg by mouth every morning.    Yes Historical Provider, MD  B Complex Vitamins (VITAMIN B COMPLEX PO) Take by mouth.   Yes Historical Provider, MD  budesonide-formoterol (SYMBICORT) 160-4.5 MCG/ACT inhaler Inhale 2 puffs into the lungs 2 (two) times daily.   Yes Historical Provider, MD  cetirizine (ZYRTEC) 10 MG tablet Take 1 tablet by mouth daily as needed. allergies 03/14/15  Yes Historical Provider, MD  clopidogrel (PLAVIX) 75 MG tablet Take 1 tablet by mouth daily. 06/13/14  Yes Historical Provider, MD  esomeprazole (NEXIUM) 40 MG capsule Take 40 mg  by mouth daily at 12 noon.   Yes Historical Provider, MD  fenofibrate (TRICOR) 145 MG tablet Take 1 tablet (145 mg total) by mouth daily. 10/22/14  Yes Lorretta Harp, MD  fluticasone (FLONASE) 50 MCG/ACT nasal spray Place 2 sprays into the nose as needed for allergies.    Yes Historical Provider, MD  furosemide (LASIX) 40 MG tablet Take 40 mg by mouth 2 (two) times daily.  04/07/13  Yes Historical Provider, MD  hydrochlorothiazide (,MICROZIDE/HYDRODIURIL,) 12.5 MG capsule Take 12.5 mg by mouth every morning.    Yes Historical Provider, MD  insulin aspart protamine- aspart (NOVOLOG MIX 70/30) (70-30) 100 UNIT/ML injection Inject 30-40 Units into the skin 2 (two) times daily with a meal. Take 40 units in the AM and 30 units at Night   Yes Historical Provider, MD  levothyroxine (SYNTHROID, LEVOTHROID)  137 MCG tablet Take 137 mcg by mouth daily before breakfast.   Yes Historical Provider, MD  losartan (COZAAR) 100 MG tablet Take 1 tablet (100 mg total) by mouth every morning. 08/05/14  Yes Lorretta Harp, MD  ONE TOUCH ULTRA TEST test strip 1 each by Other route 3 (three) times daily as needed (testing blood sugar.).  11/25/13  Yes Historical Provider, MD  oxyCODONE-acetaminophen (PERCOCET/ROXICET) 5-325 MG per tablet Take 1 tablet by mouth every 6 (six) hours as needed for moderate pain or severe pain.  01/14/15  Yes Historical Provider, MD  polyvinyl alcohol (LIQUIFILM TEARS) 1.4 % ophthalmic solution Place 2 drops into both eyes daily as needed for dry eyes.   Yes Historical Provider, MD  potassium chloride SA (K-DUR,KLOR-CON) 20 MEQ tablet Take 20 mEq by mouth every evening.   Yes Historical Provider, MD  tiZANidine (ZANAFLEX) 4 MG capsule Take 4 mg by mouth 3 (three) times daily.   Yes Historical Provider, MD  VESICARE 10 MG tablet Take 10 mg by mouth every morning.  09/24/13  Yes Historical Provider, MD   Physical Exam: Filed Vitals:   03/21/15 1351  BP: 141/71  Pulse: 91  Temp: 97.7 F (36.5  C)  TempSrc: Oral  Resp: 18  SpO2: 96%    Wt Readings from Last 3 Encounters:  01/19/15 138.347 kg (305 lb)  12/16/14 141.931 kg (312 lb 14.4 oz)  10/22/14 142.157 kg (313 lb 6.4 oz)    General:  Appears calm and comfortable Eyes: PERRL, normal lids, irises & conjunctiva ENT: grossly normal hearing, lips & tongue Neck: no LAD, masses or thyromegaly Cardiovascular: RRR, no m/r/g. No LE edema. Respiratory: CTA bilaterally, no w/r/r. Normal respiratory effort. Abdomen: soft, +suprapubic area induration and erythema with tenderness Skin: +induration with erythema.noted suprapubic area Musculoskeletal: grossly normal tone BUE/BLE Psychiatric: grossly normal mood and affect Neurologic: grossly non-focal.          Labs on Admission:  Basic Metabolic Panel:  Recent Labs Lab 03/21/15 1741  NA 144  K 3.6  CL 104  CO2 29  GLUCOSE 184*  BUN 62*  CREATININE 2.00*  CALCIUM 9.4   Liver Function Tests:  Recent Labs Lab 03/21/15 1741  AST 19  ALT 14  ALKPHOS 46  BILITOT 0.4  PROT 7.2  ALBUMIN 3.3*   No results for input(s): LIPASE, AMYLASE in the last 168 hours. No results for input(s): AMMONIA in the last 168 hours. CBC:  Recent Labs Lab 03/21/15 1741  WBC 14.0*  NEUTROABS 11.0*  HGB 10.0*  HCT 33.5*  MCV 82.1  PLT 339   Cardiac Enzymes: No results for input(s): CKTOTAL, CKMB, CKMBINDEX, TROPONINI in the last 168 hours.  BNP (last 3 results) No results for input(s): BNP in the last 8760 hours.  ProBNP (last 3 results)  Recent Labs  08/11/14 2113  PROBNP 449.3*    CBG: No results for input(s): GLUCAP in the last 168 hours.  Radiological Exams on Admission: No results found.    Assessment/Plan Principal Problem:   Cellulitis of abdominal wall Active Problems:   CKD (chronic kidney disease), stage III   HTN (hypertension)   Hyperlipidemia   Hypothyroidism   Acute on chronic renal failure   Type 2 diabetes mellitus   1. Cellulitis of  Abdominal Wall with abscess -will start on Vanc per cellulitis protocol in addition to zosyn -I think she needs to have surgery evaluate for possible I&D  -ED spoke with surgery they will try to  see tonight or in the morning -Consider a CT but she cannot have contrast due to renal issues -will hold plavix for now in case she needs I&D  2. CKD III with acute renal failure -will hold diuretics for now -monitor labs -?baseline creatinine is around 1.3-1.5 now 2.0 -will need to monitor IOs closely due to fluid retention  3. HTN -will monitor pressures her pressures in the ED were on the soft side -will hold diuretics for now though due to her creatinine being elevated and hold losartan  4. Hypothyroid -check TSH -synthroid replacement  5. Hyperlipidemia -will check lipid panel  6. Type 2 diabetes with kidney manifestations -will monitor FSBS -SSI as needed -continue with insulin regimen  7. Chronic Anemia -appears to be at baseline -monitor labs  8. Asthma -will continue with symbicort as ordered     Code Status: Full Code (must indicate code status--if unknown or must be presumed, indicate so) DVT Prophylaxis:Heparin Family Communication: None (indicate person spoken with, if applicable, with phone number if by telephone) Disposition Plan: Home (indicate anticipated LOS)  Time spent: 22mn  Roxanne Orner A [facility] Pager [phone]

## 2015-03-21 NOTE — ED Provider Notes (Signed)
CSN: [id number]     Arrival date & time 03/21/15  1346 History   First MD Initiated Contact with Patient 03/21/15 1643     Chief Complaint  Patient presents with  . Abscess     (Consider location/radiation/quality/duration/timing/severity/associated sxs/prior Treatment) Patient is a 64 y.o. female presenting with rash. The history is provided by the patient. No language interpreter was used.  Rash Location: suprapubic. Quality: painful and redness   Pain details:    Quality:  Hot   Severity:  Severe   Onset quality:  Gradual   Duration:  4 days   Timing:  Constant Timing:  Constant Progression:  Worsening Chronicity:  New Relieved by:  Nothing Worsened by:  Nothing tried Ineffective treatments:  None tried Pt has a history of multiple hospitalizations second to infections and abscesses  Past Medical History  Diagnosis Date  . History of hip replacement, total   . Hx MRSA infection     abscess left groin  . Arthritis   . GERD (gastroesophageal reflux disease)   . Gout   . Diabetes mellitus   . Hypertension   . Thyroid disease   . Hyperlipidemia   . TIA (transient ischemic attack)      X2 NO RESIDUAL PROBLEMS  . Positive cardiac stress test 12/2013    anterior and lateral ischemia on Myoview  . Hx of cardiac cath 06/2014  . Asthma   . Headache   . Loosening of prosthetic hip   . Cramping of feet   . Stress incontinence   . History of transfusion   . Sleep apnea     UNABLE TO TOLERATE C PAP   Past Surgical History  Procedure Laterality Date  . Thyroidectomy    . Abdominal hysterectomy    . Laparoscopic cholecystectomy    . Forearm fracture surgery      Left arm  . Hernia repair      w/ mesh  . Incision and drainage abscess Left 02/04/2013    Procedure: INCISION AND DRAINAGE LEFT BUTTOCK ABSCESS; INCISION AND DRAINAGE LEFT BREAST ABSCESS;  Surgeon: Harl Bowie, MD;  Location: [place];  Service: General;  Laterality: Left;  . Irrigation and debridement  abscess N/A 02/06/2013    Procedure: IRRIGATION AND DEBRIDEMENT BUTTOCK ABSCESS AND DRESSING CHANGE;  Surgeon: Harl Bowie, MD;  Location: [place];  Service: General;  Laterality: N/A;  . Irrigation and debridement abscess Left 02/08/2013    Procedure: IRRIGATION AND DEBRIDEMENT ABSCESS/DRESSING CHANGE;  Surgeon: Gwenyth Ober, MD;  Location: [place];  Service: General;  Laterality: Left;  . Incision and drainage perirectal abscess Left 02/10/2013    Procedure: IRRIGATION AND DEBRIDEMENT OF BUTTOCK/PERINEAL ABSCESS;  Surgeon: [name]. Georgette Dover, MD;  Location: [place];  Service: General;  Laterality: Left;  . Incision and drainage abscess N/A 02/12/2013    Procedure: INCISION AND DEBRIDEMENT BUTTOCK WOUND ;  Surgeon: [name]. Georgette Dover, MD;  Location: [place];  Service: General;  Laterality: N/A;  . Incision and drainage abscess N/A 02/14/2013    Procedure: INCISION AND DRAINAGE/DRESSING CHANGE;  Surgeon: Harl Bowie, MD;  Location: [place];  Service: General;  Laterality: N/A;  . Incision and drainage perirectal abscess N/A 02/16/2013    Procedure: IRRIGATION AND DEBRIDEMENT PERINEAL ABSCESS;  Surgeon: Zenovia Jarred, MD;  Location: [place];  Service: General;  Laterality: N/A;  . Joint replacement  2010 / 2012   . Colon surgery  1995    DUE TO POLYP  .  Total hip revision Right 08/11/2014    Procedure: RIGHT ACETABULAR REVISION;  Surgeon: Gearlean Alf, MD;  Location: [facility];  Service: Orthopedics;  Laterality: Right;  . Left heart catheterization with coronary angiogram N/A 07/02/2014    Procedure: LEFT HEART CATHETERIZATION WITH CORONARY ANGIOGRAM;  Surgeon: Lorretta Harp, MD;  Location: [facility] C[facility] LAB;  Service: Cardiovascular;  Laterality: N/A;   Family History  Problem Relation Age of Onset  . Diabetes Brother   . Cardiomyopathy Mother    History  Substance Use Topics  . Smoking status: Current Every Day Smoker -- 0.25 packs/day for 40 years    Types: Cigarettes  . Smokeless tobacco: Never  Used  . Alcohol Use: 0.5 oz/week    1 Standard drinks or equivalent per week     Comment: OCC   OB History    No data available     Review of Systems  Skin: Positive for rash.  All other systems reviewed and are negative.     Allergies  Ace inhibitors and Morphine and related  Home Medications   Prior to Admission medications   Medication Sig Start Date End Date Taking? Authorizing Provider  allopurinol (ZYLOPRIM) 100 MG tablet Take 100 mg by mouth every morning.     Historical Provider, MD  B Complex Vitamins (VITAMIN B COMPLEX PO) Take by mouth.    Historical Provider, MD  budesonide-formoterol (SYMBICORT) 160-4.5 MCG/ACT inhaler Inhale 2 puffs into the lungs 2 (two) times daily.    Historical Provider, MD  clopidogrel (PLAVIX) 75 MG tablet Take 1 tablet by mouth daily. 06/13/14   Historical Provider, MD  fenofibrate (TRICOR) 145 MG tablet Take 1 tablet (145 mg total) by mouth daily. 10/22/14   Lorretta Harp, MD  fluticasone (FLONASE) 50 MCG/ACT nasal spray Place 2 sprays into the nose as needed for allergies.     Historical Provider, MD  furosemide (LASIX) 40 MG tablet Take 40 mg by mouth at bedtime.  04/07/13   Historical Provider, MD  gabapentin (NEURONTIN) 100 MG capsule 3 (three) times daily. 01/17/15   Historical Provider, MD  hydrochlorothiazide (,MICROZIDE/HYDRODIURIL,) 12.5 MG capsule Take 12.5 mg by mouth every morning.     Historical Provider, MD  insulin aspart protamine-insulin aspart (NOVOLOG 70/30) (70-30) 100 UNIT/ML injection Inject 30-40 Units into the skin 2 (two) times daily with a meal. 40 units in the morning and 30 units in the evening.    Historical Provider, MD  KLOR-CON M20 20 MEQ tablet Take 20 mEq by mouth at bedtime.  04/08/13   Historical Provider, MD  levothyroxine (SYNTHROID, LEVOTHROID) 137 MCG tablet Take 137 mcg by mouth daily before breakfast.    Historical Provider, MD  losartan (COZAAR) 100 MG tablet Take 1 tablet (100 mg total) by mouth every  morning. 08/05/14   Lorretta Harp, MD  ONE TOUCH ULTRA TEST test strip 1 each by Other route 3 (three) times daily as needed (testing blood sugar.).  11/25/13   Historical Provider, MD  oxyCODONE-acetaminophen (PERCOCET/ROXICET) 5-325 MG per tablet  01/14/15   Historical Provider, MD  polyvinyl alcohol (LIQUIFILM TEARS) 1.4 % ophthalmic solution Place 2 drops into both eyes daily as needed for dry eyes.    Historical Provider, MD  rosuvastatin (CRESTOR) 10 MG tablet Take 10 mg by mouth at bedtime.    Historical Provider, MD  tiZANidine (ZANAFLEX) 4 MG capsule Take 4 mg by mouth 3 (three) times daily.    Historical Provider, MD  VESICARE 10 MG  tablet Take 10 mg by mouth every morning.  09/24/13   Historical Provider, MD   BP 141/71 mmHg  Pulse 91  Temp(Src) 97.7 F (36.5 C) (Oral)  Resp 18  SpO2 96% Physical Exam  Constitutional: She is oriented to person, place, and time. She appears well-developed and well-nourished.  HENT:  Head: Normocephalic.  Eyes: EOM are normal. Pupils are equal, round, and reactive to light.  Neck: Normal range of motion.  Cardiovascular: Normal rate and normal heart sounds.   Pulmonary/Chest: Effort normal.  Abdominal: She exhibits no distension.  Musculoskeletal: Normal range of motion.  Neurological: She is alert and oriented to person, place, and time.  Skin: There is erythema.  Large red swollen area suprapubic, tender to palpation   Psychiatric: She has a normal mood and affect.  Nursing note and vitals reviewed.   ED Course  Procedures (including critical care time) Labs Review Labs Reviewed - No data to display  Imaging Review No results found.   EKG Interpretation None      MDM  Pt given Iv fluids, labs show wbc's 14.0 Bun 62 and creatine is 2.00. Increased from 7 months ago.   I will obtain bllod cultures, start vancomycin and consult hospitalist for evaluation and admission   Final diagnoses:  None    Dr. Humphrey Rolls evaluated pt.   I  spoke to Dr. Excell Seltzer who will consult.  He advised medicine admission    Fransico Meadow, PA-C 03/21/15 [place], PA-C 03/21/15 2103  Lacretia Leigh, MD 03/21/15 [phone]

## 2015-03-21 NOTE — Transfer of Care (Signed)
Immediate Anesthesia Transfer of Care Note  Patient: [name] [name]  Procedure(s) Performed: Procedure(s): INCISION AND DRAINAGE PUBIC ABSCESS (N/A)  Patient Location: PACU  Anesthesia Type:General  Level of Consciousness:  sedated, patient cooperative and responds to stimulation  Airway & Oxygen Therapy:Patient Spontanous Breathing and Patient connected to face mask oxgen  Post-op Assessment:  Report given to PACU RN and Post -op Vital signs reviewed and stable  Post vital signs:  Reviewed and stable  Last Vitals:  Filed Vitals:   03/21/15 1904  BP: 121/49  Pulse: 83  Temp: 36.9 C  Resp: 20    Complications: No apparent anesthesia complications

## 2015-03-21 NOTE — Consult Note (Signed)
Reason for Consult:Soft tissue abscess    [name] [name] is an 64 y.o. female.  HPI: She has multiple chronic medical problems including diabetes mellitus and morbid obesity. She has a history of multiple skin and soft tissue abscesses involving the groins and upper thighs. She states that 4 days ago she noted a small area of swelling and pain on the left side of her pubis. This was not severe until she awoke this morning when she said it had enlarged dramatically overnight and become quite painful. She presented to the emergency department. She has had some fever and felt chilled. No drainage.  Past Medical History  Diagnosis Date  . History of hip replacement, total   . Hx MRSA infection     abscess left groin  . Arthritis   . GERD (gastroesophageal reflux disease)   . Gout   . Diabetes mellitus   . Hypertension   . Thyroid disease   . Hyperlipidemia   . TIA (transient ischemic attack)      X2 NO RESIDUAL PROBLEMS  . Positive cardiac stress test 12/2013    anterior and lateral ischemia on Myoview  . Hx of cardiac cath 06/2014  . Asthma   . Headache   . Loosening of prosthetic hip   . Cramping of feet   . Stress incontinence   . History of transfusion   . Sleep apnea     UNABLE TO TOLERATE C PAP    Past Surgical History  Procedure Laterality Date  . Thyroidectomy    . Abdominal hysterectomy    . Laparoscopic cholecystectomy    . Forearm fracture surgery      Left arm  . Hernia repair      w/ mesh  . Incision and drainage abscess Left 02/04/2013    Procedure: INCISION AND DRAINAGE LEFT BUTTOCK ABSCESS; INCISION AND DRAINAGE LEFT BREAST ABSCESS;  Surgeon: Harl Bowie, MD;  Location: [place];  Service: General;  Laterality: Left;  . Irrigation and debridement abscess N/A 02/06/2013    Procedure: IRRIGATION AND DEBRIDEMENT BUTTOCK ABSCESS AND DRESSING CHANGE;  Surgeon: Harl Bowie, MD;  Location: [facility];  Service: General;  Laterality: N/A;  . Irrigation and  debridement abscess Left 02/08/2013    Procedure: IRRIGATION AND DEBRIDEMENT ABSCESS/DRESSING CHANGE;  Surgeon: Gwenyth Ober, MD;  Location: [place];  Service: General;  Laterality: Left;  . Incision and drainage perirectal abscess Left 02/10/2013    Procedure: IRRIGATION AND DEBRIDEMENT OF BUTTOCK/PERINEAL ABSCESS;  Surgeon: [name]. Georgette Dover, MD;  Location: [place] [employer] [facility];  Service: General;  Laterality: Left;  . Incision and drainage abscess N/A 02/12/2013    Procedure: INCISION AND DEBRIDEMENT BUTTOCK WOUND ;  Surgeon: [name]. Georgette Dover, MD;  Location: [place];  Service: General;  Laterality: N/A;  . Incision and drainage abscess N/A 02/14/2013    Procedure: INCISION AND DRAINAGE/DRESSING CHANGE;  Surgeon: Harl Bowie, MD;  Location: [place];  Service: General;  Laterality: N/A;  . Incision and drainage perirectal abscess N/A 02/16/2013    Procedure: IRRIGATION AND DEBRIDEMENT PERINEAL ABSCESS;  Surgeon: Zenovia Jarred, MD;  Location: [place];  Service: General;  Laterality: N/A;  . Joint replacement  2010 / 2012   . Colon surgery  1995    DUE TO POLYP  . Total hip revision Right 08/11/2014    Procedure: RIGHT ACETABULAR REVISION;  Surgeon: Gearlean Alf, MD;  Location: [facility];  Service: Orthopedics;  Laterality: Right;  . Left heart catheterization  with coronary angiogram N/A 07/02/2014    Procedure: LEFT HEART CATHETERIZATION WITH CORONARY ANGIOGRAM;  Surgeon: Lorretta Harp, MD;  [facility] C[facility];  Service: Cardiovascular;  Laterality: N/A;    Family History  Problem Relation Age of Onset  . Diabetes Brother   . Cardiomyopathy Mother     Social History:  reports that she has been smoking Cigarettes.  She has a 10 pack-year smoking history. She has never used smokeless tobacco. She reports that she drinks about 0.5 oz of alcohol per week. She reports that she does not use illicit drugs.  Allergies:  Allergies  Allergen Reactions  . Ace Inhibitors     Tongue swell  . Morphine And Related  Itching    Current Facility-Administered Medications  Medication Dose Route Frequency Provider Last Rate Last Dose  . vancomycin (VANCOCIN) IVPB 1000 mg/200 mL premix  1,000 mg Intravenous Once Fransico Meadow, PA-C 200 mL/hr at 03/21/15 2002 1,000 mg at 03/21/15 2002   Current Outpatient Prescriptions  Medication Sig Dispense Refill  . allopurinol (ZYLOPRIM) 100 MG tablet Take 100 mg by mouth every morning.     . B Complex Vitamins (VITAMIN B COMPLEX PO) Take by mouth.    . budesonide-formoterol (SYMBICORT) 160-4.5 MCG/ACT inhaler Inhale 2 puffs into the lungs 2 (two) times daily.    . cetirizine (ZYRTEC) 10 MG tablet Take 1 tablet by mouth daily as needed. allergies    . clopidogrel (PLAVIX) 75 MG tablet Take 1 tablet by mouth daily.  8  . esomeprazole (NEXIUM) 40 MG capsule Take 40 mg by mouth daily at 12 noon.    . fenofibrate (TRICOR) 145 MG tablet Take 1 tablet (145 mg total) by mouth daily. 90 tablet 1  . fluticasone (FLONASE) 50 MCG/ACT nasal spray Place 2 sprays into the nose as needed for allergies.     . furosemide (LASIX) 40 MG tablet Take 40 mg by mouth 2 (two) times daily.     . hydrochlorothiazide (,MICROZIDE/HYDRODIURIL,) 12.5 MG capsule Take 12.5 mg by mouth every morning.     . insulin aspart protamine- aspart (NOVOLOG MIX 70/30) (70-30) 100 UNIT/ML injection Inject 30-40 Units into the skin 2 (two) times daily with a meal. Take 40 units in the AM and 30 units at Night    . levothyroxine (SYNTHROID, LEVOTHROID) 137 MCG tablet Take 137 mcg by mouth daily before breakfast.    . losartan (COZAAR) 100 MG tablet Take 1 tablet (100 mg total) by mouth every morning. 90 tablet 3  . ONE TOUCH ULTRA TEST test strip 1 each by Other route 3 (three) times daily as needed (testing blood sugar.).     Marland Kitchen oxyCODONE-acetaminophen (PERCOCET/ROXICET) 5-325 MG per tablet Take 1 tablet by mouth every 6 (six) hours as needed for moderate pain or severe pain.   0  . polyvinyl alcohol (LIQUIFILM  TEARS) 1.4 % ophthalmic solution Place 2 drops into both eyes daily as needed for dry eyes.    . potassium chloride SA (K-DUR,KLOR-CON) 20 MEQ tablet Take 20 mEq by mouth every evening.    Marland Kitchen tiZANidine (ZANAFLEX) 4 MG capsule Take 4 mg by mouth 3 (three) times daily.    . VESICARE 10 MG tablet Take 10 mg by mouth every morning.       Results for orders placed or performed during the hospital encounter of 03/21/15 (from the past 48 hour(s))  CBC with Differential/Platelet     Status: Abnormal   Collection Time: 03/21/15  5:41  PM  Result Value Ref Range   WBC 14.0 (H) 4.0 - 10.5 K/uL   RBC 4.08 3.87 - 5.11 MIL/uL   Hemoglobin 10.0 (L) 12.0 - 15.0 g/dL   HCT 33.5 (L) 36.0 - 46.0 %   MCV 82.1 78.0 - 100.0 fL   MCH 24.5 (L) 26.0 - 34.0 pg   MCHC 29.9 (L) 30.0 - 36.0 g/dL   RDW 18.3 (H) 11.5 - 15.5 %   Platelets 339 150 - 400 K/uL   Neutrophils Relative % 79 (H) 43 - 77 %   Neutro Abs 11.0 (H) 1.7 - 7.7 K/uL   Lymphocytes Relative 15 12 - 46 %   Lymphs Abs 2.1 0.7 - 4.0 K/uL   Monocytes Relative 5 3 - 12 %   Monocytes Absolute 0.7 0.1 - 1.0 K/uL   Eosinophils Relative 1 0 - 5 %   Eosinophils Absolute 0.1 0.0 - 0.7 K/uL   Basophils Relative 0 0 - 1 %   Basophils Absolute 0.0 0.0 - 0.1 K/uL  Comprehensive metabolic panel     Status: Abnormal   Collection Time: 03/21/15  5:41 PM  Result Value Ref Range   Sodium 144 135 - 145 mmol/L   Potassium 3.6 3.5 - 5.1 mmol/L   Chloride 104 101 - 111 mmol/L   CO2 29 22 - 32 mmol/L   Glucose, Bld 184 (H) 65 - 99 mg/dL   BUN 62 (H) 6 - 20 mg/dL   Creatinine, Ser 2.00 (H) 0.44 - 1.00 mg/dL   Calcium 9.4 8.9 - 10.3 mg/dL   Total Protein 7.2 6.5 - 8.1 g/dL   Albumin 3.3 (L) 3.5 - 5.0 g/dL   AST 19 15 - 41 U/L   ALT 14 14 - 54 U/L   Alkaline Phosphatase 46 38 - 126 U/L   Total Bilirubin 0.4 0.3 - 1.2 mg/dL   GFR calc non Af Amer 25 (L) >60 mL/min   GFR calc Af Amer 29 (L) >60 mL/min    Comment: (NOTE) The eGFR has been calculated using the CKD  EPI equation. This calculation has not been validated in all clinical situations. eGFR's persistently <60 mL/min signify possible Chronic Kidney Disease.    Anion gap 11 5 - 15  CBG monitoring, ED     Status: Abnormal   Collection Time: 03/21/15  7:17 PM  Result Value Ref Range   Glucose-Capillary 169 (H) 65 - 99 mg/dL    No results found.  Review of Systems  Constitutional: Positive for fever and chills.  Respiratory: Negative.   Cardiovascular: Negative.   Gastrointestinal: Negative.    Blood pressure 121/49, pulse 83, temperature 98.4 F (36.9 C), temperature source Oral, resp. rate 20, SpO2 97 %. Physical Exam General: Morbidly obese Afro-American female in no acute distress Skin: Warm and dry. See abdomen below HEENT: Sclerae nonicteric. No palpable masses. Lungs: Clear breath sounds bilaterally without wheezing or increased work of breathing Cardiac: Regular rate and rhythm. 1+ lower extremity edema. No JVD. Abdomen: Obese. Healed right upper quadrant incision. Generally soft and nontender. Over the left mons pubis is a 7 cm area of swelling and tenderness and erythema with central fluctuance. Extremities: Healed previous I and D sites proximal left thigh. No infection. Trace edema Neurologic: She is alert and oriented and conversant. No focal deficits. Affect and thought content normal.  Assessment/Plan: Large pubic abscess patient with diabetes and morbid obesity and history of multiple skin and soft tissue infections. This will need to be  incised and drained under general anesthesia. I discussed this with the patient including the nature of the procedure and some risks of general anesthesia including pulmonary and cardiac problems and the fact that we will need to be left open with packing and require IV antibiotics in the hospital for at least a couple of days. All questions were answered and she is in agreement.  Angeliyah Kirkey T 03/21/2015, 8:37 PM

## 2015-03-21 NOTE — ED Notes (Signed)
Pt c/o vaginal abscess that started thursday, woke up to it larger.

## 2015-03-22 ENCOUNTER — Encounter (HOSPITAL_COMMUNITY): Payer: Self-pay | Admitting: General Surgery

## 2015-03-22 DIAGNOSIS — E785 Hyperlipidemia, unspecified: Secondary | ICD-10-CM

## 2015-03-22 DIAGNOSIS — N189 Chronic kidney disease, unspecified: Secondary | ICD-10-CM

## 2015-03-22 DIAGNOSIS — N179 Acute kidney failure, unspecified: Secondary | ICD-10-CM

## 2015-03-22 LAB — COMPREHENSIVE METABOLIC PANEL
ALT: 13 U/L — ABNORMAL LOW (ref 14–54)
AST: 20 U/L (ref 15–41)
Albumin: 3.2 g/dL — ABNORMAL LOW (ref 3.5–5.0)
Alkaline Phosphatase: 45 U/L (ref 38–126)
Anion gap: 10 (ref 5–15)
BUN: 59 mg/dL — ABNORMAL HIGH (ref 6–20)
CO2: 25 mmol/L (ref 22–32)
Calcium: 8.6 mg/dL — ABNORMAL LOW (ref 8.9–10.3)
Chloride: 106 mmol/L (ref 101–111)
Creatinine, Ser: 2.06 mg/dL — ABNORMAL HIGH (ref 0.44–1.00)
GFR calc Af Amer: 28 mL/min — ABNORMAL LOW (ref >60)
GFR calc non Af Amer: 24 mL/min — ABNORMAL LOW (ref >60)
Glucose, Bld: 236 mg/dL — ABNORMAL HIGH (ref 65–99)
Potassium: 3.7 mmol/L (ref 3.5–5.1)
Sodium: 141 mmol/L (ref 135–145)
Total Bilirubin: 0.5 mg/dL (ref 0.3–1.2)
Total Protein: 6.6 g/dL (ref 6.5–8.1)

## 2015-03-22 LAB — CBC
HCT: 30.8 % — ABNORMAL LOW (ref 36.0–46.0)
Hemoglobin: 9.3 g/dL — ABNORMAL LOW (ref 12.0–15.0)
MCH: 24.8 pg — ABNORMAL LOW (ref 26.0–34.0)
MCHC: 30.2 g/dL (ref 30.0–36.0)
MCV: 82.1 fL (ref 78.0–100.0)
Platelets: 336 10*3/uL (ref 150–400)
RBC: 3.75 MIL/uL — ABNORMAL LOW (ref 3.87–5.11)
RDW: 18.4 % — ABNORMAL HIGH (ref 11.5–15.5)
WBC: 14.2 10*3/uL — ABNORMAL HIGH (ref 4.0–10.5)

## 2015-03-22 LAB — TSH: TSH: 2.523 u[IU]/mL (ref 0.350–4.500)

## 2015-03-22 LAB — HIV ANTIBODY (ROUTINE TESTING W REFLEX): HIV Screen 4th Generation wRfx: NONREACTIVE

## 2015-03-22 LAB — GLUCOSE, CAPILLARY
Glucose-Capillary: 162 mg/dL — ABNORMAL HIGH (ref 65–99)
Glucose-Capillary: 200 mg/dL — ABNORMAL HIGH (ref 65–99)
Glucose-Capillary: 152 mg/dL — ABNORMAL HIGH (ref 65–99)
Glucose-Capillary: 182 mg/dL — ABNORMAL HIGH (ref 65–99)
Glucose-Capillary: 161 mg/dL — ABNORMAL HIGH (ref 65–99)

## 2015-03-22 LAB — LIPID PANEL
Cholesterol: 130 mg/dL (ref 0–200)
HDL: 27 mg/dL — ABNORMAL LOW (ref >40)
LDL Cholesterol: 74 mg/dL (ref 0–99)
Total CHOL/HDL Ratio: 4.8 RATIO
Triglycerides: 144 mg/dL (ref <150)
VLDL: 29 mg/dL (ref 0–40)

## 2015-03-22 LAB — PROTIME-INR
INR: 1.09 (ref 0.00–1.49)
Prothrombin Time: 14.3 seconds (ref 11.6–15.2)

## 2015-03-22 LAB — APTT: aPTT: 31 seconds (ref 24–37)

## 2015-03-22 MED ORDER — INSULIN ASPART PROT & ASPART (70-30 MIX) 100 UNIT/ML ~~LOC~~ SUSP
40.0000 [IU] | Freq: Every day | SUBCUTANEOUS | Status: DC
  Administered 2015-03-22 – 2015-03-23 (×2): 40 [IU] via SUBCUTANEOUS
  Filled 2015-03-22: qty 10

## 2015-03-22 MED ORDER — FENOFIBRATE 160 MG PO TABS
160.0000 mg | ORAL_TABLET | Freq: Every day | ORAL | Status: DC
  Administered 2015-03-22 – 2015-03-23 (×2): 160 mg via ORAL
  Filled 2015-03-22 (×2): qty 1

## 2015-03-22 MED ORDER — LORATADINE 10 MG PO TABS
10.0000 mg | ORAL_TABLET | Freq: Every day | ORAL | Status: DC
  Administered 2015-03-22 – 2015-03-23 (×2): 10 mg via ORAL
  Filled 2015-03-22 (×2): qty 1

## 2015-03-22 MED ORDER — HEPARIN SODIUM (PORCINE) 5000 UNIT/ML IJ SOLN
5000.0000 [IU] | Freq: Three times a day (TID) | INTRAMUSCULAR | Status: DC
  Administered 2015-03-22 – 2015-03-23 (×5): 5000 [IU] via SUBCUTANEOUS
  Filled 2015-03-22 (×8): qty 1

## 2015-03-22 MED ORDER — DARIFENACIN HYDROBROMIDE ER 15 MG PO TB24
15.0000 mg | ORAL_TABLET | Freq: Every day | ORAL | Status: DC
  Administered 2015-03-22 – 2015-03-23 (×2): 15 mg via ORAL
  Filled 2015-03-22 (×2): qty 1

## 2015-03-22 MED ORDER — BUDESONIDE-FORMOTEROL FUMARATE 160-4.5 MCG/ACT IN AERO
2.0000 | INHALATION_SPRAY | Freq: Two times a day (BID) | RESPIRATORY_TRACT | Status: DC
  Administered 2015-03-22 – 2015-03-23 (×3): 2 via RESPIRATORY_TRACT
  Filled 2015-03-22: qty 6

## 2015-03-22 MED ORDER — INSULIN ASPART 100 UNIT/ML ~~LOC~~ SOLN
0.0000 [IU] | Freq: Three times a day (TID) | SUBCUTANEOUS | Status: DC
  Administered 2015-03-22 (×3): 3 [IU] via SUBCUTANEOUS
  Administered 2015-03-23: 2 [IU] via SUBCUTANEOUS
  Administered 2015-03-23: 3 [IU] via SUBCUTANEOUS

## 2015-03-22 MED ORDER — VANCOMYCIN HCL 10 G IV SOLR
1750.0000 mg | INTRAVENOUS | Status: DC
  Administered 2015-03-22 – 2015-03-23 (×2): 1750 mg via INTRAVENOUS
  Filled 2015-03-22 (×2): qty 1750

## 2015-03-22 MED ORDER — ACETAMINOPHEN 325 MG PO TABS: 650.0000 mg | ORAL_TABLET | Freq: Four times a day (QID) | ORAL | Status: DC | PRN

## 2015-03-22 MED ORDER — VITAMIN B-1 100 MG PO TABS
100.0000 mg | ORAL_TABLET | Freq: Every day | ORAL | Status: DC
  Administered 2015-03-22 – 2015-03-23 (×2): 100 mg via ORAL
  Filled 2015-03-22 (×2): qty 1

## 2015-03-22 MED ORDER — POLYVINYL ALCOHOL 1.4 % OP SOLN
2.0000 [drp] | Freq: Every day | OPHTHALMIC | Status: DC | PRN
  Filled 2015-03-22: qty 15

## 2015-03-22 MED ORDER — HYDRALAZINE HCL 20 MG/ML IJ SOLN: 5.0000 mg | INTRAMUSCULAR | Status: DC | PRN

## 2015-03-22 MED ORDER — ACETAMINOPHEN 650 MG RE SUPP: 650.0000 mg | Freq: Four times a day (QID) | RECTAL | Status: DC | PRN

## 2015-03-22 MED ORDER — VANCOMYCIN HCL IN DEXTROSE 1-5 GM/200ML-% IV SOLN: 1000.0000 mg | Freq: Once | INTRAVENOUS | Status: DC

## 2015-03-22 MED ORDER — FOLIC ACID 1 MG PO TABS
1.0000 mg | ORAL_TABLET | Freq: Every day | ORAL | Status: DC
  Administered 2015-03-22 – 2015-03-23 (×2): 1 mg via ORAL
  Filled 2015-03-22 (×2): qty 1

## 2015-03-22 MED ORDER — HYDROCHLOROTHIAZIDE 12.5 MG PO CAPS
12.5000 mg | ORAL_CAPSULE | ORAL | Status: DC
  Administered 2015-03-22: 12.5 mg via ORAL
  Filled 2015-03-22 (×2): qty 1

## 2015-03-22 MED ORDER — HYDROMORPHONE HCL 1 MG/ML IJ SOLN
0.5000 mg | Freq: Once | INTRAMUSCULAR | Status: AC
  Administered 2015-03-22: 0.5 mg via INTRAVENOUS
  Filled 2015-03-22: qty 1

## 2015-03-22 MED ORDER — INSULIN ASPART PROT & ASPART (70-30 MIX) 100 UNIT/ML ~~LOC~~ SUSP
30.0000 [IU] | Freq: Every day | SUBCUTANEOUS | Status: DC
  Administered 2015-03-22: 30 [IU] via SUBCUTANEOUS
  Filled 2015-03-22: qty 10

## 2015-03-22 MED ORDER — SODIUM CHLORIDE 0.9 % IV SOLN
INTRAVENOUS | Status: DC
Start: 2015-03-22 — End: 2015-03-23
  Administered 2015-03-22: 15:00:00 via INTRAVENOUS

## 2015-03-22 MED ORDER — PANTOPRAZOLE SODIUM 40 MG PO TBEC
80.0000 mg | DELAYED_RELEASE_TABLET | Freq: Every day | ORAL | Status: DC
  Administered 2015-03-22 – 2015-03-23 (×2): 80 mg via ORAL
  Filled 2015-03-22 (×3): qty 2

## 2015-03-22 MED ORDER — OXYCODONE HCL 5 MG PO TABS
5.0000 mg | ORAL_TABLET | ORAL | Status: DC | PRN
  Administered 2015-03-22 (×3): 5 mg via ORAL
  Filled 2015-03-22 (×3): qty 1

## 2015-03-22 MED ORDER — ONDANSETRON HCL 4 MG/2ML IJ SOLN: 4.0000 mg | Freq: Four times a day (QID) | INTRAMUSCULAR | Status: DC | PRN

## 2015-03-22 MED ORDER — LEVOTHYROXINE SODIUM 137 MCG PO TABS
137.0000 ug | ORAL_TABLET | Freq: Every day | ORAL | Status: DC
  Administered 2015-03-22 – 2015-03-23 (×2): 137 ug via ORAL
  Filled 2015-03-22 (×3): qty 1

## 2015-03-22 MED ORDER — DOCUSATE SODIUM 100 MG PO CAPS
100.0000 mg | ORAL_CAPSULE | Freq: Two times a day (BID) | ORAL | Status: DC
  Administered 2015-03-22 – 2015-03-23 (×3): 100 mg via ORAL
  Filled 2015-03-22 (×4): qty 1

## 2015-03-22 MED ORDER — ALLOPURINOL 100 MG PO TABS
100.0000 mg | ORAL_TABLET | ORAL | Status: DC
  Administered 2015-03-22 – 2015-03-23 (×2): 100 mg via ORAL
  Filled 2015-03-22 (×3): qty 1

## 2015-03-22 MED ORDER — ONDANSETRON HCL 4 MG PO TABS: 4.0000 mg | ORAL_TABLET | Freq: Four times a day (QID) | ORAL | Status: DC | PRN

## 2015-03-22 MED ORDER — SODIUM CHLORIDE 0.45 % IV SOLN
INTRAVENOUS | Status: DC
  Administered 2015-03-22: 01:00:00 via INTRAVENOUS

## 2015-03-22 MED ORDER — FLUTICASONE PROPIONATE 50 MCG/ACT NA SUSP
2.0000 | Freq: Every day | NASAL | Status: DC
  Administered 2015-03-22 – 2015-03-23 (×2): 2 via NASAL
  Filled 2015-03-22: qty 16

## 2015-03-22 MED ORDER — ADULT MULTIVITAMIN W/MINERALS CH
1.0000 | ORAL_TABLET | Freq: Every day | ORAL | Status: DC
  Administered 2015-03-22 – 2015-03-23 (×2): 1 via ORAL
  Filled 2015-03-22 (×2): qty 1

## 2015-03-22 MED ORDER — PIPERACILLIN-TAZOBACTAM 3.375 G IVPB
3.3750 g | Freq: Three times a day (TID) | INTRAVENOUS | Status: DC
  Administered 2015-03-22 – 2015-03-23 (×5): 3.375 g via INTRAVENOUS
  Filled 2015-03-22 (×7): qty 50

## 2015-03-22 MED ORDER — TIZANIDINE HCL 4 MG PO TABS
4.0000 mg | ORAL_TABLET | Freq: Three times a day (TID) | ORAL | Status: DC
  Administered 2015-03-22 – 2015-03-23 (×4): 4 mg via ORAL
  Filled 2015-03-22 (×6): qty 1

## 2015-03-22 NOTE — Care Management Note (Signed)
Case Management Note  Patient Details  Name: [name] [name] MRN: [id number] Date of Birth: 07-03-1951  Subjective/Objective:          Admitted s/p I & D of abscess       Action/Plan: Discharge planning. Spoke with patient at bedside, has aide from [employer] 4h/d, 7 d/wk. Will need wound care at d/c and would like to use [facility] for [facility] services, contacted [facility] to arrange. Anticipate d/c in am.         Expected Discharge Date:   (unknown)               Expected Discharge Plan:  [place]  In-House Referral:  NA  Discharge planning Services  CM Consult  Post Acute Care Choice:  Home Health Choice offered to:  Patient  DME Arranged:    DME Agency:     HH Arranged:  RN, Disease Management [place] Agency:  [place]  Status of Service:  Completed, signed off  Medicare Important Message Given:  N/A - LOS <3 / Initial given by admissions Date Medicare IM Given:    Medicare IM give by:    Date Additional Medicare IM Given:    Additional Medicare Important Message give by:     If discussed at [place] of Stay Meetings, dates discussed:    Additional Comments:  Guadalupe Maple, RN 03/22/2015, 2:07 PM

## 2015-03-22 NOTE — Progress Notes (Signed)
Patient ID: [name] [name], female   DOB: [birth year], 64 y.o.   MRN: [id number] 1 Day Post-Op  Subjective: Pt having some pain this morning, but controlled.    Objective: Vital signs in last 24 hours: Temp:  [97.7 F (36.5 C)-98.6 F (37 C)] 98.6 F (37 C) (06/14 0555) Pulse Rate:  [66-91] 84 (06/14 0555) Resp:  [11-20] 18 (06/14 0555) BP: (119-152)/(45-123) 119/63 mmHg (06/14 0555) SpO2:  [90 %-100 %] 96 % (06/14 0843) Weight:  [140.2 kg (309 lb 1.4 oz)-141.8 kg (312 lb 9.8 oz)] 141.8 kg (312 lb 9.8 oz) (06/14 0500) Last BM Date: 03/20/15  Intake/Output from previous day: 06/13 0701 - 06/14 0700 In: 573.3 [P.O.:325; I.V.:248.3] Out: 400 [Urine:400] Intake/Output this shift: Total I/O In: -  Out: 300 [Urine:300]  PE: Skin: pubic wound is clean and packed.  Still with some surrounding induration, but no active bleeding or purulent drainage currently.  Lab Results:   Recent Labs  03/21/15 1741 03/22/15 0259  WBC 14.0* 14.2*  HGB 10.0* 9.3*  HCT 33.5* 30.8*  PLT 339 336   BMET  Recent Labs  03/21/15 1741 03/22/15 0236  NA 144 141  K 3.6 3.7  CL 104 106  CO2 29 25  GLUCOSE 184* 236*  BUN 62* 59*  CREATININE 2.00* 2.06*  CALCIUM 9.4 8.6*   PT/INR  Recent Labs  03/22/15 0236  LABPROT 14.3  INR 1.09   CMP     Component Value Date/Time   NA 141 03/22/2015 0236   NA 146* 12/23/2013 1132   K 3.7 03/22/2015 0236   CL 106 03/22/2015 0236   CO2 25 03/22/2015 0236   GLUCOSE 236* 03/22/2015 0236   GLUCOSE 86 12/23/2013 1132   BUN 59* 03/22/2015 0236   BUN 31* 12/23/2013 1132   CREATININE 2.06* 03/22/2015 0236   CREATININE 1.32* 06/29/2014 1100   CALCIUM 8.6* 03/22/2015 0236   PROT 6.6 03/22/2015 0236   PROT 7.3 12/23/2013 1132   ALBUMIN 3.2* 03/22/2015 0236   AST 20 03/22/2015 0236   ALT 13* 03/22/2015 0236   ALKPHOS 45 03/22/2015 0236   BILITOT 0.5 03/22/2015 0236   GFRNONAA 24* 03/22/2015 0236   GFRAA 28* 03/22/2015 0236   Lipase      Component Value Date/Time   LIPASE 17 02/02/2013 0850       Studies/Results: No results found.  Anti-infectives: Anti-infectives    Start     Dose/Rate Route Frequency Ordered Stop   03/22/15 0900  vancomycin (VANCOCIN) 1,750 mg in sodium chloride 0.9 % 500 mL IVPB     1,750 mg 250 mL/hr over 120 Minutes Intravenous Every 24 hours 03/22/15 0832     03/22/15 0045  piperacillin-tazobactam (ZOSYN) IVPB 3.375 g     3.375 g 12.5 mL/hr over 240 Minutes Intravenous 3 times per day 03/22/15 0031     03/22/15 0030  vancomycin (VANCOCIN) IVPB 1000 mg/200 mL premix  Status:  Discontinued     1,000 mg 200 mL/hr over 60 Minutes Intravenous  Once 03/22/15 0023 03/22/15 0029   03/21/15 1900  vancomycin (VANCOCIN) IVPB 1000 mg/200 mL premix     1,000 mg 200 mL/hr over 60 Minutes Intravenous  Once 03/21/15 1850 03/21/15 2102       Assessment/Plan  POD 1, s/p I&D of mons pubis wound - [facility] - 03/21/2015  -begin NS WD dressing changes BID today (note suture possibly has caught gauze) -cont IV vanc and zosyn, await cultures, prelim GPC, GPR, GNR -hopefully  if doing well, can go home tomorrow.  Will follow.   DM CDK - Creatinine - 2.06 - 03/22/2015 Morbid obesity   LOS: 1 day   OSBORNE,KELLY E 03/22/2015, 8:48 AM Pager: [phone]  Agree with above. She is on chronic disability for hips and other problems. Her brother and sister are visiting her.  Alphonsa Overall, MD, [facility] Surgery Pager: [phone] Office phone:  [phone]

## 2015-03-22 NOTE — Progress Notes (Signed)
[facility] PROGRESS NOTE  [name] [name] TKZ:[id number] DOB: [birth year] DOA: 03/21/2015 PCP: [name] Fendt, MD  Assessment/Plan: 1. Abdominal wall cellulitis without sepsis 1. General Surgery following 2. Pt is s/p I/D of pubic abscess on 6/13 3. Wound cx with mod gm neg rods and mod gm pos cocci 4. Currently continued on empiric vanc and zosyn 5. Awaiting final culture results 6. Mild leukocytosis remains stable 7. Surgery recs for NS WD dressing changes bid 8. Hopefully d/c soon 2. CDK3 1. Cr remains stable at around 2 2. Will continue on NS ata 75cc/hr 3. Monitor renal function 3. HTN 1. BP stable albeit suboptimally contolled 2. Will hold HCTZ as pt is being hydrated per above 3. Start PRN hydralazine 4. Hypothyroid 1. Thyroid replacement per home regimen 5. Hyperlipidemia 1. Cont on fibrate per home regimen 6. DM2 with nephropathy 1. Stable 2. Cont insulin per home regimen 3. On SSI coverage 7. Chronic anemia 1. Hgb stable 2. Cont monitor hgb 8. Asthma 1. Stable 2. No wheezing on exam 9. DVT prophylaxis 1. Heparin subQ  Code Status: Full Family Communication: Pt in room (indicate person spoken with, relationship, and if by phone, the number) Disposition Plan: Possible d/c home 24-48hrs   Consultants:  General Surgery  Procedures:  I/D of pubic abscess 6/13  Antibiotics:  Vancomycin 6/13>>>  Zosyn 6/13>>>  HPI/Subjective: No complaints.  Objective: Filed Vitals:   03/22/15 0555 03/22/15 0843 03/22/15 1015 03/22/15 1350  BP: 119/63  132/56 146/62  Pulse: 84  82 72  Temp: 98.6 F (37 C)  97.8 F (36.6 C) 98.2 F (36.8 C)  TempSrc: Oral  Oral Oral  Resp: '18  18 18  '$ Height:      Weight:      SpO2: 90% 96% 92% 100%    Intake/Output Summary (Last 24 hours) at 03/22/15 1407 Last data filed at 03/22/15 1350  Gross per 24 hour  Intake 1063.33 ml  Output    700 ml  Net 363.33 ml   Filed Weights   03/22/15 0024 03/22/15 0500   Weight: 140.2 kg (309 lb 1.4 oz) 141.8 kg (312 lb 9.8 oz)    Exam:   General:  Awake, in nad  Cardiovascular: regular, s1, s2  Respiratory: normal resp effort, no wheezing  Abdomen: soft,nondistended, post-op dressings in place  Musculoskeletal: perfused, no clubbing   Data Reviewed: Basic Metabolic Panel:  Recent Labs Lab 03/21/15 1741 03/22/15 0236  NA 144 141  K 3.6 3.7  CL 104 106  CO2 29 25  GLUCOSE 184* 236*  BUN 62* 59*  CREATININE 2.00* 2.06*  CALCIUM 9.4 8.6*   Liver Function Tests:  Recent Labs Lab 03/21/15 1741 03/22/15 0236  AST 19 20  ALT 14 13*  ALKPHOS 46 45  BILITOT 0.4 0.5  PROT 7.2 6.6  ALBUMIN 3.3* 3.2*   No results for input(s): LIPASE, AMYLASE in the last 168 hours. No results for input(s): AMMONIA in the last 168 hours. CBC:  Recent Labs Lab 03/21/15 1741 03/22/15 0259  WBC 14.0* 14.2*  NEUTROABS 11.0*  --   HGB 10.0* 9.3*  HCT 33.5* 30.8*  MCV 82.1 82.1  PLT 339 336   Cardiac Enzymes: No results for input(s): CKTOTAL, CKMB, CKMBINDEX, TROPONINI in the last 168 hours. BNP (last 3 results) No results for input(s): BNP in the last 8760 hours.  ProBNP (last 3 results)  Recent Labs  08/11/14 2113  PROBNP 449.3*    CBG:  Recent Labs Lab 03/21/15  1917 03/21/15 2234 03/22/15 0123 03/22/15 0745 03/22/15 1227  GLUCAP 169* 162* 162* 200* 152*    Recent Results (from the past 240 hour(s))  Anaerobic culture     Status: None (Preliminary result)   Collection Time: 03/21/15  9:59 PM  Result Value Ref Range Status   Specimen Description ABSCESS  Final   Special Requests NONE  Final   Gram Stain PENDING  Incomplete   Culture   Final    NO ANAEROBES ISOLATED; CULTURE IN PROGRESS FOR 5 DAYS Performed at [facility]    Report Status PENDING  Incomplete  Culture, routine-abscess     Status: None (Preliminary result)   Collection Time: 03/21/15  9:59 PM  Result Value Ref Range Status   Specimen  Description ABSCESS  Final   Special Requests NONE  Final   Gram Stain   Final    FEW WBC PRESENT,BOTH PMN AND MONONUCLEAR NO SQUAMOUS EPITHELIAL CELLS SEEN MODERATE GRAM POSITIVE COCCI IN PAIRS MODERATE GRAM NEGATIVE RODS RARE GRAM POSITIVE RODS    Culture NO GROWTH Performed at [facility]   Final   Report Status PENDING  Incomplete     Studies: No results found.  Scheduled Meds: . allopurinol  100 mg Oral BH-q7a  . budesonide-formoterol  2 puff Inhalation BID  . darifenacin  15 mg Oral Daily  . docusate sodium  100 mg Oral BID  . fenofibrate  160 mg Oral Daily  . fluticasone  2 spray Each Nare Daily  . folic acid  1 mg Oral Daily  . heparin  5,000 Units Subcutaneous 3 times per day  . hydrochlorothiazide  12.5 mg Oral BH-q7a  . insulin aspart  0-15 Units Subcutaneous TID WC  . insulin aspart protamine- aspart  30 Units Subcutaneous Q supper  . insulin aspart protamine- aspart  40 Units Subcutaneous Q breakfast  . levothyroxine  137 mcg Oral QAC breakfast  . loratadine  10 mg Oral Daily  . multivitamin with minerals  1 tablet Oral Daily  . pantoprazole  80 mg Oral Q1200  . piperacillin-tazobactam (ZOSYN)  IV  3.375 g Intravenous 3 times per day  . thiamine  100 mg Oral Daily  . tiZANidine  4 mg Oral TID  . vancomycin  1,750 mg Intravenous Q24H   Continuous Infusions: . sodium chloride 10 mL/hr at 03/22/15 0110    Principal Problem:   Cellulitis of abdominal wall Active Problems:   CKD (chronic kidney disease), stage III   HTN (hypertension)   Hyperlipidemia   Hypothyroidism   Acute on chronic renal failure   Type 2 diabetes mellitus   Abdominal wall abscess    [name] K  [facility] Pager [phone]. If 7PM-7AM, please contact night-coverage at [email or web address], password [facility] 03/22/2015, 2:07 PM  LOS: 1 day

## 2015-03-22 NOTE — Progress Notes (Signed)
ANTIBIOTIC CONSULT NOTE - INITIAL  Pharmacy Consult for Vancomycin and Zosyn  Indication: abdominal infection, abscess  Allergies  Allergen Reactions  . Ace Inhibitors     Tongue swell  . Morphine And Related Itching    Patient Measurements: Height: 5' 4.5" (163.8 cm) Weight: (!) 309 lb 1.4 oz (140.2 kg) IBW/kg (Calculated) : 55.85 Adjusted Body Weight:   Vital Signs: Temp: 97.9 F (36.6 C) (06/14 0215) Temp Source: Oral (06/14 0215) BP: 123/52 mmHg (06/14 0215) Pulse Rate: 89 (06/14 0215) Intake/Output from previous day: 06/13 0701 - 06/14 0700 In: 533.3 [P.O.:325; I.V.:208.3] Out: 400 [Urine:400] Intake/Output from this shift: Total I/O In: 533.3 [P.O.:325; I.V.:208.3] Out: 400 [Urine:400]  Labs:  Recent Labs  03/21/15 1741 03/22/15 0236 03/22/15 0259  WBC 14.0*  --  14.2*  HGB 10.0*  --  9.3*  PLT 339  --  336  CREATININE 2.00* 2.06*  --    Estimated Creatinine Clearance: 39 mL/min (by C-G formula based on Cr of 2.06). No results for input(s): VANCOTROUGH, VANCOPEAK, VANCORANDOM, GENTTROUGH, GENTPEAK, GENTRANDOM, TOBRATROUGH, TOBRAPEAK, TOBRARND, AMIKACINPEAK, AMIKACINTROU, AMIKACIN in the last 72 hours.   Microbiology: No results found for this or any previous visit (from the past 720 hour(s)).  Medical History: Past Medical History  Diagnosis Date  . History of hip replacement, total   . Hx MRSA infection     abscess left groin  . Arthritis   . GERD (gastroesophageal reflux disease)   . Gout   . Diabetes mellitus   . Hypertension   . Thyroid disease   . Hyperlipidemia   . TIA (transient ischemic attack)      X2 NO RESIDUAL PROBLEMS  . Positive cardiac stress test 12/2013    anterior and lateral ischemia on Myoview  . Hx of cardiac cath 06/2014  . Asthma   . Headache   . Loosening of prosthetic hip   . Cramping of feet   . Stress incontinence   . History of transfusion   . Sleep apnea     UNABLE TO TOLERATE C PAP    Medications:   Anti-infectives    Start     Dose/Rate Route Frequency Ordered Stop   03/22/15 0045  piperacillin-tazobactam (ZOSYN) IVPB 3.375 g     3.375 g 12.5 mL/hr over 240 Minutes Intravenous 3 times per day 03/22/15 0031     03/22/15 0030  vancomycin (VANCOCIN) IVPB 1000 mg/200 mL premix  Status:  Discontinued     1,000 mg 200 mL/hr over 60 Minutes Intravenous  Once 03/22/15 0023 03/22/15 0029   03/21/15 1900  vancomycin (VANCOCIN) IVPB 1000 mg/200 mL premix     1,000 mg 200 mL/hr over 60 Minutes Intravenous  Once 03/21/15 1850 03/21/15 2102     Assessment: Patient with diabetes and abdominal abscess/infection.  First dose of antibiotics already given.  Patient with reduced renal function.  Goal of Therapy:  Vancomycin trough level 15-20 mcg/ml  Zosyn based on renal function   Plan:  Measure antibiotic drug levels at steady state Follow up culture results Vancomycin '1750mg'$  iv q24hr   Zosyn 3.375g IV Q8H infused over 4hrs.   Tyler Deis, Shea Stakes [place] 03/22/2015,5:38 AM

## 2015-03-23 DIAGNOSIS — L03311 Cellulitis of abdominal wall: Secondary | ICD-10-CM

## 2015-03-23 DIAGNOSIS — I10 Essential (primary) hypertension: Secondary | ICD-10-CM

## 2015-03-23 DIAGNOSIS — N183 Chronic kidney disease, stage 3 (moderate): Secondary | ICD-10-CM

## 2015-03-23 DIAGNOSIS — L02211 Cutaneous abscess of abdominal wall: Principal | ICD-10-CM

## 2015-03-23 LAB — GLUCOSE, CAPILLARY
Glucose-Capillary: 142 mg/dL — ABNORMAL HIGH (ref 65–99)
Glucose-Capillary: 179 mg/dL — ABNORMAL HIGH (ref 65–99)

## 2015-03-23 LAB — HEMOGLOBIN A1C
Hgb A1c MFr Bld: 9.3 % — ABNORMAL HIGH (ref 4.8–5.6)
Mean Plasma Glucose: 220 mg/dL

## 2015-03-23 MED ORDER — HYDROMORPHONE HCL 1 MG/ML IJ SOLN
0.5000 mg | Freq: Once | INTRAMUSCULAR | Status: AC
  Administered 2015-03-23: 0.5 mg via INTRAVENOUS
  Filled 2015-03-23: qty 1

## 2015-03-23 MED ORDER — DOXYCYCLINE MONOHYDRATE 100 MG PO TABS: 100.0000 mg | ORAL_TABLET | Freq: Two times a day (BID) | ORAL | Status: DC

## 2015-03-23 MED ORDER — OXYCODONE HCL 5 MG PO TABS
5.0000 mg | ORAL_TABLET | ORAL | Status: DC | PRN
  Administered 2015-03-23: 10 mg via ORAL
  Filled 2015-03-23: qty 2

## 2015-03-23 MED ORDER — ACETAMINOPHEN 325 MG PO TABS: 650.0000 mg | ORAL_TABLET | Freq: Four times a day (QID) | ORAL | Status: DC | PRN

## 2015-03-23 NOTE — Discharge Summary (Signed)
Physician Discharge Summary  [name] [name] KVQ:[id number] DOB: 01-Mar-1951 DOA: 03/21/2015  PCP: [name], MD  Admit date: 03/21/2015 Discharge date: 03/23/2015  Recommendations for Outpatient Follow-up:  1. Please continue doxycycline for 12 more days on discharge  2. Please note we have put on hold Lasix and HCTZ because of renal insufficiency. Please check with your primary care physician when would be safe to continue these medications. 3. Please have your primary care physician recheck kidney function during next appointment.  Discharge Diagnoses:  Principal Problem:   Cellulitis of abdominal wall Active Problems:   CKD (chronic kidney disease), stage III   HTN (hypertension)   Hyperlipidemia   Hypothyroidism   Acute on chronic renal failure   Type 2 diabetes mellitus   Abdominal wall abscess    Discharge Condition: stable   Diet recommendation: as tolerated   History of present illness:  64 y.o. female with history of Diabetes type 2, CKD stage 3, hyperlipidemia, hypertension, hypothyroidism, asthma who presented to [facility] ED with suprapubic rash which she noted overnight and it was very tender to touch. Apparently she has had history of abscesses in this area in past. She reported associated fevers on admission. No nausea or vomiting. No chest pain.No blood ins tool or urine.  She underwent incision and drainage by surgery. She was on vanco and zosyn on admission and D/C on doxycycline as mentioned above.    Hospital Course:   Abdominal wall cellulitis without sepsis / leukocytosis  - Status post I&D on pubic abscess 6/13 by surgery - Initially on vanco and zosyn but D/C on doxycycline as prescribed - Wound cx with mod gm neg rods and mod gm pos cocci  Chronic kidney disease, stage 3 - Cr remains stable at around 2  Anemia of chronic disease - Secondary to CKD - Hemoglobin stable   Essential HTN - Continue losartan on discharge   Hypothyroidism -  Continue synthroid  Hyperlipidemia - Cont fibrate per home regimen  DM2 with nephropathy and renal manifestations - Continue insulin per home regimen   DVT prophylaxis - Heparin subQ in hospital   Code Status: Full    Consultants:  General Surgery  Procedures:  I/D of pubic abscess 6/13  Antibiotics:  Vancomycin 6/13>>> 03/23/2015  Zosyn 6/13>>> 03/23/2015    Signed:  Leisa Lenz, MD  [facility] 03/23/2015, 10:32 AM  Pager #: [phone]  Time spent in minutes: more than 30 minutes   Discharge Exam: Filed Vitals:   03/23/15 0923  BP:   Pulse: 79  Temp:   Resp: 18   Filed Vitals:   03/22/15 2151 03/23/15 0612 03/23/15 0900 03/23/15 0923  BP: 155/60 156/57    Pulse: 80 77 79 79  Temp: 98.3 F (36.8 C) 98.3 F (36.8 C)    TempSrc: Oral Oral    Resp: '18 18 18 18  '$ Height:      Weight:      SpO2: 98% 97% 98% 98%    General: Pt is alert, follows commands appropriately, not in acute distress Cardiovascular: Regular rate and rhythm, S1/S2 +, no murmurs Respiratory: Clear to auscultation bilaterally, no wheezing, no crackles, no rhonchi Abdominal: Soft, non tender, non distended, bowel sounds +, no guarding Extremities: no edema, no cyanosis, pulses palpable bilaterally DP and PT Neuro: Grossly nonfocal  Discharge Instructions  Discharge Instructions    Call MD for:  persistant dizziness or light-headedness    Complete by:  As directed      Call MD  for:  persistant nausea and vomiting    Complete by:  As directed      Call MD for:  severe uncontrolled pain    Complete by:  As directed      Diet - low sodium heart healthy    Complete by:  As directed      Discharge instructions    Complete by:  As directed   1. Please continue doxycycline for 12 more days on discharge  2. Please note we have put on hold Lasix and HCTZ because of renal insufficiency. Please check with your primary care physician when would be safe to continue these  medications. 3. Please have your primary care physician recheck kidney function during next appointment.     Increase activity slowly    Complete by:  As directed             Medication List    STOP taking these medications        furosemide 40 MG tablet  Commonly known as:  LASIX     hydrochlorothiazide 12.5 MG capsule  Commonly known as:  MICROZIDE     potassium chloride SA 20 MEQ tablet  Commonly known as:  K-DUR,KLOR-CON      TAKE these medications        acetaminophen 325 MG tablet  Commonly known as:  TYLENOL  Take 2 tablets (650 mg total) by mouth every 6 (six) hours as needed for mild pain (or Fever >/= 101).     allopurinol 100 MG tablet  Commonly known as:  ZYLOPRIM  Take 100 mg by mouth every morning.     budesonide-formoterol 160-4.5 MCG/ACT inhaler  Commonly known as:  SYMBICORT  Inhale 2 puffs into the lungs 2 (two) times daily.     cetirizine 10 MG tablet  Commonly known as:  ZYRTEC  Take 1 tablet by mouth daily as needed. allergies     clopidogrel 75 MG tablet  Commonly known as:  PLAVIX  Take 1 tablet by mouth daily.     doxycycline 100 MG tablet  Commonly known as:  ADOXA  Take 1 tablet (100 mg total) by mouth 2 (two) times daily.     esomeprazole 40 MG capsule  Commonly known as:  NEXIUM  Take 40 mg by mouth daily at 12 noon.     fenofibrate 145 MG tablet  Commonly known as:  TRICOR  Take 1 tablet (145 mg total) by mouth daily.     fluticasone 50 MCG/ACT nasal spray  Commonly known as:  FLONASE  Place 2 sprays into the nose as needed for allergies.     insulin aspart protamine- aspart (70-30) 100 UNIT/ML injection  Commonly known as:  NOVOLOG MIX 70/30  Inject 30-40 Units into the skin 2 (two) times daily with a meal. Take 40 units in the AM and 30 units at Night     levothyroxine 137 MCG tablet  Commonly known as:  SYNTHROID, LEVOTHROID  Take 137 mcg by mouth daily before breakfast.     losartan 100 MG tablet  Commonly known  as:  COZAAR  Take 1 tablet (100 mg total) by mouth every morning.     ONE TOUCH ULTRA TEST test strip  Generic drug:  glucose blood  1 each by Other route 3 (three) times daily as needed (testing blood sugar.).     oxyCODONE-acetaminophen 5-325 MG per tablet  Commonly known as:  PERCOCET/ROXICET  Take 1 tablet by mouth every 6 (six) hours as  needed for moderate pain or severe pain.     polyvinyl alcohol 1.4 % ophthalmic solution  Commonly known as:  LIQUIFILM TEARS  Place 2 drops into both eyes daily as needed for dry eyes.     tiZANidine 4 MG capsule  Commonly known as:  ZANAFLEX  Take 4 mg by mouth 3 (three) times daily.     VESICARE 10 MG tablet  Generic drug:  solifenacin  Take 10 mg by mouth every morning.     VITAMIN B COMPLEX PO  Take by mouth.           Follow-up Information    Follow up with [facility].   Why:  Nurse for wound care   Contact information:   [address] [place] [address] [phone]       Follow up with [facility] On 04/12/2015.   Specialty:  General Surgery   Why:  [facility], 3:30pm arrive no later than 3:00pm for paperwork   Contact information:   [address] [place] [address] [phone]       Follow up with [name] A, MD. Schedule an appointment as soon as possible for a visit in 1 week.   Specialty:  Internal Medicine   Why:  Follow up appt after recent hospitalization   Contact information:   [place] [address] [phone]        The results of significant diagnostics from this hospitalization (including imaging, microbiology, ancillary and laboratory) are listed below for reference.    Significant Diagnostic Studies: US Renal  02/28/2015   CLINICAL DATA:  Chronic kidney disease. Incontinence. Hypertension. History of renal cysts.  EXAM: RENAL / URINARY TRACT ULTRASOUND COMPLETE  COMPARISON:  None.  FINDINGS: Right Kidney:   Length: 11.7 cm. Echogenicity is normal. No hydronephrosis. Lateral midpole cyst is 2.2 x 1.9 x 1.9 cm.  Left Kidney:  Length: 12.8 cm. Echogenicity is normal. No hydronephrosis. Midpole cyst is 1.4 x 1.4 x 1.4 cm. Lower pole cyst is 2.2 x 2.2 x 2.1 cm.  Bladder:  Appears normal for degree of bladder distention.  Study is technically degraded by patient body habitus.  IMPRESSION: 1. Bilateral renal cysts. 2. No hydronephrosis.   Electronically Signed   By: Nolon Nations M.D.   On: 02/28/2015 12:26    Microbiology: Recent Results (from the past 240 hour(s))  Blood culture (routine x 2)     Status: None (Preliminary result)   Collection Time: 03/21/15  7:42 PM  Result Value Ref Range Status   Specimen Description BLOOD RIGHT ANTECUBITAL  Final   Special Requests BOTTLES DRAWN AEROBIC AND ANAEROBIC 5 CC EACH  Final   Culture   Final           BLOOD CULTURE RECEIVED NO GROWTH TO DATE CULTURE WILL BE HELD FOR 5 DAYS BEFORE ISSUING A FINAL NEGATIVE REPORT Performed at [facility]    Report Status PENDING  Incomplete  Blood culture (routine x 2)     Status: None (Preliminary result)   Collection Time: 03/21/15  7:42 PM  Result Value Ref Range Status   Specimen Description BLOOD LEFT HAND  Final   Special Requests BOTTLES DRAWN AEROBIC AND ANAEROBIC 5 CC EACH  Final   Culture   Final           BLOOD CULTURE RECEIVED NO GROWTH TO DATE CULTURE WILL BE HELD FOR 5 DAYS BEFORE ISSUING  A FINAL NEGATIVE REPORT Performed at [facility]    Report Status PENDING  Incomplete  Anaerobic culture     Status: None (Preliminary result)   Collection Time: 03/21/15  9:59 PM  Result Value Ref Range Status   Specimen Description ABSCESS  Final   Special Requests NONE  Final   Gram Stain PENDING  Incomplete   Culture   Final    NO ANAEROBES ISOLATED; CULTURE IN PROGRESS FOR 5 DAYS Performed at [facility]    Report Status PENDING  Incomplete  Culture, routine-abscess     Status:  None (Preliminary result)   Collection Time: 03/21/15  9:59 PM  Result Value Ref Range Status   Specimen Description ABSCESS  Final   Special Requests NONE  Final   Gram Stain   Final    FEW WBC PRESENT,BOTH PMN AND MONONUCLEAR NO SQUAMOUS EPITHELIAL CELLS SEEN MODERATE GRAM POSITIVE COCCI IN PAIRS MODERATE GRAM NEGATIVE RODS RARE GRAM POSITIVE RODS    Culture   Final    NO GROWTH 1 DAY Performed at [facility]    Report Status PENDING  Incomplete  Culture, blood (routine x 2)     Status: None (Preliminary result)   Collection Time: 03/22/15  2:36 AM  Result Value Ref Range Status   Specimen Description BLOOD RIGHT ARM  Final   Special Requests BOTTLES DRAWN AEROBIC AND ANAEROBIC 5CC  Final   Culture   Final           BLOOD CULTURE RECEIVED NO GROWTH TO DATE CULTURE WILL BE HELD FOR 5 DAYS BEFORE ISSUING A FINAL NEGATIVE REPORT Performed at [facility]    Report Status PENDING  Incomplete  Culture, blood (routine x 2)     Status: None (Preliminary result)   Collection Time: 03/22/15  2:59 AM  Result Value Ref Range Status   Specimen Description BLOOD RIGHT HAND  Final   Special Requests BOTTLES DRAWN AEROBIC AND ANAEROBIC 5CC  Final   Culture   Final           BLOOD CULTURE RECEIVED NO GROWTH TO DATE CULTURE WILL BE HELD FOR 5 DAYS BEFORE ISSUING A FINAL NEGATIVE REPORT Performed at [facility]    Report Status PENDING  Incomplete     Labs: Basic Metabolic Panel:  Recent Labs Lab 03/21/15 1741 03/22/15 0236  NA 144 141  K 3.6 3.7  CL 104 106  CO2 29 25  GLUCOSE 184* 236*  BUN 62* 59*  CREATININE 2.00* 2.06*  CALCIUM 9.4 8.6*   Liver Function Tests:  Recent Labs Lab 03/21/15 1741 03/22/15 0236  AST 19 20  ALT 14 13*  ALKPHOS 46 45  BILITOT 0.4 0.5  PROT 7.2 6.6  ALBUMIN 3.3* 3.2*   No results for input(s): LIPASE, AMYLASE in the last 168 hours. No results for input(s): AMMONIA in the last 168 hours. CBC:  Recent  Labs Lab 03/21/15 1741 03/22/15 0259  WBC 14.0* 14.2*  NEUTROABS 11.0*  --   HGB 10.0* 9.3*  HCT 33.5* 30.8*  MCV 82.1 82.1  PLT 339 336   Cardiac Enzymes: No results for input(s): CKTOTAL, CKMB, CKMBINDEX, TROPONINI in the last 168 hours. BNP: BNP (last 3 results) No results for input(s): BNP in the last 8760 hours.  ProBNP (last 3 results)  Recent Labs  08/11/14 2113  PROBNP 449.3*    CBG:  Recent Labs Lab 03/22/15 0745 03/22/15 1227 03/22/15 1800 03/22/15 2240 03/23/15 1610  GLUCAP 200* 152* 182* 161* 142*

## 2015-03-23 NOTE — Progress Notes (Signed)
Pt for discharge. [facility] to be out this evening for dressing change. Discharge instructions discussed with pt until no further questions ask.

## 2015-03-23 NOTE — Progress Notes (Addendum)
Patient ID: [name] [name], female   DOB: 12-02-1950, 64 y.o.   MRN: [id number] 2 Days Post-Op  Subjective: Pt having pain, but otherwise ok.  Objective: Vital signs in last 24 hours: Temp:  [97.8 F (36.6 C)-98.3 F (36.8 C)] 98.3 F (36.8 C) (06/15 0612) Pulse Rate:  [72-82] 77 (06/15 0612) Resp:  [18] 18 (06/15 0612) BP: (132-156)/(56-62) 156/57 mmHg (06/15 0612) SpO2:  [92 %-100 %] 97 % (06/15 0612) Last BM Date: 03/20/15  Intake/Output from previous day: 06/14 0701 - 06/15 0700 In: 2846.3 [P.O.:1690; I.V.:1106.3; IV Piggyback:50] Out: 2050 [Urine:2050] Intake/Output this shift:    PE: Abd: wound is clean with no induration or erythema  Lab Results:   Recent Labs  03/21/15 1741 03/22/15 0259  WBC 14.0* 14.2*  HGB 10.0* 9.3*  HCT 33.5* 30.8*  PLT 339 336   BMET  Recent Labs  03/21/15 1741 03/22/15 0236  NA 144 141  K 3.6 3.7  CL 104 106  CO2 29 25  GLUCOSE 184* 236*  BUN 62* 59*  CREATININE 2.00* 2.06*  CALCIUM 9.4 8.6*   PT/INR  Recent Labs  03/22/15 0236  LABPROT 14.3  INR 1.09   CMP     Component Value Date/Time   NA 141 03/22/2015 0236   NA 146* 12/23/2013 1132   K 3.7 03/22/2015 0236   CL 106 03/22/2015 0236   CO2 25 03/22/2015 0236   GLUCOSE 236* 03/22/2015 0236   GLUCOSE 86 12/23/2013 1132   BUN 59* 03/22/2015 0236   BUN 31* 12/23/2013 1132   CREATININE 2.06* 03/22/2015 0236   CREATININE 1.32* 06/29/2014 1100   CALCIUM 8.6* 03/22/2015 0236   PROT 6.6 03/22/2015 0236   PROT 7.3 12/23/2013 1132   ALBUMIN 3.2* 03/22/2015 0236   AST 20 03/22/2015 0236   ALT 13* 03/22/2015 0236   ALKPHOS 45 03/22/2015 0236   BILITOT 0.5 03/22/2015 0236   GFRNONAA 24* 03/22/2015 0236   GFRAA 28* 03/22/2015 0236   Lipase     Component Value Date/Time   LIPASE 17 02/02/2013 0850       Studies/Results: No results found.  Anti-infectives: Anti-infectives    Start     Dose/Rate Route Frequency Ordered Stop   03/22/15 0900  vancomycin  (VANCOCIN) 1,750 mg in sodium chloride 0.9 % 500 mL IVPB     1,750 mg 250 mL/hr over 120 Minutes Intravenous Every 24 hours 03/22/15 0832     03/22/15 0045  piperacillin-tazobactam (ZOSYN) IVPB 3.375 g     3.375 g 12.5 mL/hr over 240 Minutes Intravenous 3 times per day 03/22/15 0031     03/22/15 0030  vancomycin (VANCOCIN) IVPB 1000 mg/200 mL premix  Status:  Discontinued     1,000 mg 200 mL/hr over 60 Minutes Intravenous  Once 03/22/15 0023 03/22/15 0029   03/21/15 1900  vancomycin (VANCOCIN) IVPB 1000 mg/200 mL premix     1,000 mg 200 mL/hr over 60 Minutes Intravenous  Once 03/21/15 1850 03/21/15 2102     Assessment/Plan  1. POD2, s/p I&D od mons pubis abscess -wound is clean -arrange [facility] -patient is surgically stable for dc home. -cx are still prelim, but given G+C present would cover here with doxy or bactrim to go home given she has a hx of MRSA -follow up with [name] in 2-3 weeks for wound check    LOS: 2 days    OSBORNE,KELLY E 03/23/2015, 9:06 AM Pager: [phone]  Agree with above. Home health care in room arranging follow  up.  Alphonsa Overall, MD, [facility] Pager: [phone] Office phone:  [phone]

## 2015-03-23 NOTE — Discharge Instructions (Signed)
Dressing Change °A dressing is a material placed over wounds. It keeps the wound clean, dry, and protected from further injury. This provides an environment that favors wound healing.  °BEFORE YOU BEGIN °· Get your supplies together. Things you may need include: °¨ Saline solution. °¨ Flexible gauze dressing. °¨ Medicated cream. °¨ Tape. °¨ Gloves. °¨ Abdominal dressing pads. °¨ Gauze squares. °¨ Plastic bags. °· Take pain medicine 30 minutes before the dressing change if you need it. °· Take a shower before you do the first dressing change of the day. Use plastic wrap or a plastic bag to prevent the dressing from getting wet. °REMOVING YOUR OLD DRESSING  °· Wash your hands with soap and water. Dry your hands with a clean towel. °· Put on your gloves. °· Remove any tape. °· Carefully remove the old dressing. If the dressing sticks, you may dampen it with warm water to loosen it, or follow your caregiver's specific directions. °· Remove any gauze or packing tape that is in your wound. °· Take off your gloves. °· Put the gloves, tape, gauze, or any packing tape into a plastic bag. °CHANGING YOUR DRESSING °· Open the supplies. °· Take the cap off the saline solution. °· Open the gauze package so that the gauze remains on the inside of the package. °· Put on your gloves. °· Clean your wound as told by your caregiver. °· If you have been told to keep your wound dry, follow those instructions. °· Your caregiver may tell you to do one or more of the following: °¨ Pick up the gauze. Pour the saline solution over the gauze. Squeeze out the extra saline solution. °¨ Put medicated cream or other medicine on your wound if you have been told to do so. °¨ Put the solution soaked gauze only in your wound, not on the skin around it. °¨ Pack your wound loosely or as told by your caregiver. °¨ Put dry gauze on your wound. °¨ Put abdominal dressing pads over the dry gauze if your wet gauze soaks through. °· Tape the abdominal dressing  pads in place so they will not fall off. Do not wrap the tape completely around the affected part (arm, leg, abdomen). °· Wrap the dressing pads with a flexible gauze dressing to secure it in place. °· Take off your gloves. Put them in the plastic bag with the old dressing. Tie the bag shut and throw it away. °· Keep the dressing clean and dry until your next dressing change. °· Wash your hands. °SEEK MEDICAL CARE IF: °· Your skin around the wound looks red. °· Your wound feels more tender or sore. °· You see pus in the wound. °· Your wound smells bad. °· You have a fever. °· Your skin around the wound has a rash that itches and burns. °· You see black or yellow skin in your wound that was not there before. °· You feel nauseous, throw up, and feel very tired. °Document Released: 11/01/2004 Document Revised: 12/17/2011 Document Reviewed: 08/06/2011 °[employer]® Patient Information ©2015 [employer]. This information is not intended to replace advice given to you by your health care provider. Make sure you discuss any questions you have with your health care provider. ° °

## 2015-03-25 LAB — CULTURE, ROUTINE-ABSCESS

## 2015-03-26 LAB — ANAEROBIC CULTURE

## 2015-03-28 LAB — CULTURE, BLOOD (ROUTINE X 2)
Culture: NO GROWTH
Culture: NO GROWTH
Culture: NO GROWTH
Culture: NO GROWTH

## 2015-04-08 ENCOUNTER — Emergency Department (HOSPITAL_COMMUNITY): Payer: Commercial Managed Care - HMO

## 2015-04-08 ENCOUNTER — Encounter (HOSPITAL_COMMUNITY): Payer: Self-pay | Admitting: Emergency Medicine

## 2015-04-08 ENCOUNTER — Emergency Department (HOSPITAL_COMMUNITY)
Admission: EM | Admit: 2015-04-08 | Discharge: 2015-04-08 | Disposition: A | Discharge status: Home / Self Care | Payer: Commercial Managed Care - HMO | Attending: Emergency Medicine | Admitting: Emergency Medicine

## 2015-04-08 DIAGNOSIS — Z7902 Long term (current) use of antithrombotics/antiplatelets: Secondary | ICD-10-CM | POA: Insufficient documentation

## 2015-04-08 DIAGNOSIS — M7989 Other specified soft tissue disorders: Secondary | ICD-10-CM | POA: Diagnosis not present

## 2015-04-08 DIAGNOSIS — Z8669 Personal history of other diseases of the nervous system and sense organs: Secondary | ICD-10-CM | POA: Diagnosis not present

## 2015-04-08 DIAGNOSIS — N189 Chronic kidney disease, unspecified: Secondary | ICD-10-CM | POA: Diagnosis not present

## 2015-04-08 DIAGNOSIS — R0602 Shortness of breath: Secondary | ICD-10-CM

## 2015-04-08 DIAGNOSIS — Z720 Tobacco use: Secondary | ICD-10-CM | POA: Diagnosis not present

## 2015-04-08 DIAGNOSIS — Z9889 Other specified postprocedural states: Secondary | ICD-10-CM | POA: Diagnosis not present

## 2015-04-08 DIAGNOSIS — Z7951 Long term (current) use of inhaled steroids: Secondary | ICD-10-CM | POA: Insufficient documentation

## 2015-04-08 DIAGNOSIS — M1990 Unspecified osteoarthritis, unspecified site: Secondary | ICD-10-CM | POA: Insufficient documentation

## 2015-04-08 DIAGNOSIS — K219 Gastro-esophageal reflux disease without esophagitis: Secondary | ICD-10-CM | POA: Insufficient documentation

## 2015-04-08 DIAGNOSIS — E119 Type 2 diabetes mellitus without complications: Secondary | ICD-10-CM | POA: Diagnosis not present

## 2015-04-08 DIAGNOSIS — E039 Hypothyroidism, unspecified: Secondary | ICD-10-CM | POA: Insufficient documentation

## 2015-04-08 DIAGNOSIS — M109 Gout, unspecified: Secondary | ICD-10-CM | POA: Insufficient documentation

## 2015-04-08 DIAGNOSIS — Z8673 Personal history of transient ischemic attack (TIA), and cerebral infarction without residual deficits: Secondary | ICD-10-CM | POA: Insufficient documentation

## 2015-04-08 DIAGNOSIS — Z792 Long term (current) use of antibiotics: Secondary | ICD-10-CM | POA: Diagnosis not present

## 2015-04-08 DIAGNOSIS — Z794 Long term (current) use of insulin: Secondary | ICD-10-CM | POA: Insufficient documentation

## 2015-04-08 DIAGNOSIS — J45901 Unspecified asthma with (acute) exacerbation: Secondary | ICD-10-CM | POA: Insufficient documentation

## 2015-04-08 DIAGNOSIS — Z79899 Other long term (current) drug therapy: Secondary | ICD-10-CM | POA: Diagnosis not present

## 2015-04-08 DIAGNOSIS — I129 Hypertensive chronic kidney disease with stage 1 through stage 4 chronic kidney disease, or unspecified chronic kidney disease: Secondary | ICD-10-CM | POA: Insufficient documentation

## 2015-04-08 DIAGNOSIS — E785 Hyperlipidemia, unspecified: Secondary | ICD-10-CM | POA: Diagnosis not present

## 2015-04-08 DIAGNOSIS — Z96649 Presence of unspecified artificial hip joint: Secondary | ICD-10-CM | POA: Insufficient documentation

## 2015-04-08 HISTORY — DX: Chronic kidney disease, unspecified: N18.9

## 2015-04-08 LAB — CBC WITH DIFFERENTIAL/PLATELET
Basophils Absolute: 0 10*3/uL (ref 0.0–0.1)
Basophils Relative: 0 % (ref 0–1)
Eosinophils Absolute: 0.1 10*3/uL (ref 0.0–0.7)
Eosinophils Relative: 1 % (ref 0–5)
HCT: 29.2 % — ABNORMAL LOW (ref 36.0–46.0)
Hemoglobin: 8.6 g/dL — ABNORMAL LOW (ref 12.0–15.0)
Lymphocytes Relative: 17 % (ref 12–46)
Lymphs Abs: 2.3 10*3/uL (ref 0.7–4.0)
MCH: 24.2 pg — ABNORMAL LOW (ref 26.0–34.0)
MCHC: 29.5 g/dL — ABNORMAL LOW (ref 30.0–36.0)
MCV: 82.3 fL (ref 78.0–100.0)
Monocytes Absolute: 0.6 10*3/uL (ref 0.1–1.0)
Monocytes Relative: 5 % (ref 3–12)
Neutro Abs: 10.2 10*3/uL — ABNORMAL HIGH (ref 1.7–7.7)
Neutrophils Relative %: 77 % (ref 43–77)
Platelets: 308 10*3/uL (ref 150–400)
RBC: 3.55 MIL/uL — ABNORMAL LOW (ref 3.87–5.11)
RDW: 19.9 % — ABNORMAL HIGH (ref 11.5–15.5)
WBC: 13.3 10*3/uL — ABNORMAL HIGH (ref 4.0–10.5)

## 2015-04-08 LAB — BASIC METABOLIC PANEL
Anion gap: 8 (ref 5–15)
BUN: 46 mg/dL — ABNORMAL HIGH (ref 6–20)
CO2: 29 mmol/L (ref 22–32)
Calcium: 9.3 mg/dL (ref 8.9–10.3)
Chloride: 107 mmol/L (ref 101–111)
Creatinine, Ser: 1.49 mg/dL — ABNORMAL HIGH (ref 0.44–1.00)
GFR calc Af Amer: 42 mL/min — ABNORMAL LOW (ref >60)
GFR calc non Af Amer: 36 mL/min — ABNORMAL LOW (ref >60)
Glucose, Bld: 114 mg/dL — ABNORMAL HIGH (ref 65–99)
Potassium: 4 mmol/L (ref 3.5–5.1)
Sodium: 144 mmol/L (ref 135–145)

## 2015-04-08 LAB — TROPONIN I: Troponin I: 0.05 ng/mL — ABNORMAL HIGH (ref <0.031)

## 2015-04-08 MED ORDER — FUROSEMIDE 10 MG/ML IJ SOLN
80.0000 mg | Freq: Once | INTRAMUSCULAR | Status: AC
  Administered 2015-04-08: 80 mg via INTRAVENOUS
  Filled 2015-04-08: qty 8

## 2015-04-08 NOTE — ED Notes (Signed)
Unable to start PIV and draw blood after 2 attempts, second RN to assess.

## 2015-04-08 NOTE — Discharge Instructions (Signed)
Recommend continue taking her Lasix as directed on a when necessary basis. Most likely need a dose tomorrow. Today's workup showed improvement in your kidney function. You are anemic and will need follow-up blood work to make sure that you don't drop below the threshold of 8 on your hemoglobin and require blood transfusion. Use resource guide below to help you find a regular doctor since she wanted switch doctors. Chest x-ray raises concerns for a pneumonia and CT scan shows no evidence of any pulmonary problems. 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   Emergency Department Resource Guide 1) Find a Doctor and Pay Out of Pocket Although you won't have to find out who is covered by your insurance plan, it is a good idea to ask around and get recommendations. You will then need to call the office and see if the doctor you have chosen will accept you as a new patient and what types of options they offer for patients who are self-pay. Some doctors offer discounts or will set up payment plans for their patients who do not have insurance, but you will need to ask so you aren't surprised when you get to your appointment.  2) Contact Your Local Health Department Not all health departments have doctors that can see patients for sick visits, but many do, so it is worth a call to see if yours does. If you don't know where your local health department is, you can check in your phone book. The CDC also has a tool to help you locate your state's health department, and many state websites also have listings of all of their local health departments.  3) Find a [facility] If your illness is not likely to be very severe or complicated, you may want to try a walk in clinic. These are popping up all over the country in pharmacies, drugstores, and shopping centers. They're usually staffed by nurse practitioners or physician assistants that have been trained to treat common illnesses and complaints. They're usually fairly  quick and inexpensive. However, if you have serious medical issues or chronic medical problems, these are probably not your best option.  No Primary Care Doctor: - Call Health Connect at  [phone] - they can help you locate a primary care doctor that  accepts your insurance, provides certain services, etc. - Physician Referral Service- [phone]  Chronic Pain Problems: Organization         Address  Phone   Notes  [facility]  [phone] Patients need to be referred by their primary care doctor.   Medication Assistance: Organization         Address  Phone   Notes  [facility] Medication [facility] [address] [place], [place] [address] [phone] --Must be a resident of [facility] -- Must have NO insurance coverage whatsoever (no Medicaid/ Medicare, etc.) -- The pt. MUST have a primary care doctor that directs their care regularly and follows them in the community   MedAssist  [phone]   [employer]  [phone]    Agencies that provide inexpensive medical care: Organization         Address  Phone   Notes  [place]  [phone]   [facility]    [phone]   [facility] [place], [place] [address] [phone]   [place] [address], [place] [phone]  Planned Parenthood    [phone]   [facility]    [phone]   Community [facility] and [address], [place] Phone:  [phone], Fax:  [phone] Hours of Operation:  9 am - 6 pm, M-F.  Also accepts Medicaid/Medicare and self-pay.  [facility] [place], [address], [place] Phone: [phone], Fax: [phone]. Hours of Operation:  8:30 am - 5:30 pm, M-F.  Also accepts Medicaid and self-pay.  [facility] [address] [place] Phone: [phone]    [place], [place], [place] [phone], Ext. [phone] Mondays & Thursdays: 7-9 AM.  First 15 patients are seen on a first come, first serve basis.    [place] Providers:  Organization         Address  Phone   Notes  [facility] [address], [place] [phone] Also accepts self-pay patients.  [facility] [address], [place]  [phone]   [place], [address], [place] [phone]   [facility] [address] [place] [phone]   Lucianne Lei [address], [place]   [phone] Only accepts [place] Access [place] patients after they have their name applied to their card.   Self-Pay (no insurance) in [facility]:  Organization         Address  Phone   Notes  Sickle Cell Patients, [facility] [place] [phone]   [facility] [place] [phone]   [facility] [place]  [place], [place], [place] [phone]   [facility]/Dr. Osei-Bonsu  [address], [place] or [address], [place] [phone] Phone number for both [place] and [place] locations is the same.  [facility] [address] [place] [phone]   [facility] [address] [place] or [address] [phone]   [facility] [address] [place] [phone], phone; [phone], fax Sees patients 1st and 3rd Saturday of every month.  Must not qualify for public or private insurance (i.e. Medicaid, Medicare, [place] Health Choice, Veterans' Benefits)  Household income should be no more than 200% of the poverty level The clinic cannot treat you if you are  pregnant or think you are pregnant  Sexually transmitted diseases are not treated at the clinic.    Dental Care: Organization         Address  Phone  Notes  [facility] Department of [facility] [place] [phone] Accepts children up to age 53 who are enrolled in [place] or [place]; pregnant women with a Medicaid card; and children who have applied for Medicaid or [place] Health Choice, but were declined, whose parents can pay a reduced fee at time of service.  [facility] Department of [facility]  [address], [place] [phone] Accepts children up to age 22 who are enrolled in [place] or [place]; pregnant women with a Medicaid card; and children who have applied for Medicaid or [place], but were declined,  whose parents can pay a reduced fee at time of service.  [facility] Adult [facility] Access PROGRAM  [place] [phone] Patients are seen by appointment only. Walk-ins are not accepted. [place] will see patients 63 years of age and older. Monday - Tuesday (8am-5pm) Most Wednesdays (8:30-5pm) $30 per visit, cash only  [facility] Adult [facility] Access PRO[facility]  [address], [facility] [phone] Patients are seen by appointment only. Walk-ins are not accepted. [place] will see patients 27 years of age and older. One Wednesday Evening (Monthly: Volunteer Based).  $30 per visit, cash only  [place]  [phone] for adults; Children under age 65, call Graduate Pediatric Dentistry at [phone]. Children aged 65-14, please call [phone] to request a pediatric application.  Dental services are provided in all areas of dental care including fillings, crowns and bridges, complete and partial dentures, implants, gum treatment, root canals, and extractions. Preventive care is also provided. Treatment is provided to  both adults and children. Patients are selected via a lottery and there is often a waiting list.   [facility] [address], [place]  [phone] [email or web address]   [facility] [address] [place], [place] [phone], Ext. [phone] Second and Fourth Thursday of each month, opens at 6:30 AM; Clinic ends at 9 AM.  Patients are seen on a first-come first-served basis, and a limited number are seen during each clinic.   [facility]  [address] Hillard Danker [place], [place] [phone]   Eligibility Requirements You must have lived in [place], [place], or [place] counties for at least the last three months.   You cannot be eligible for state or federal sponsored [employer], including [employer], [place], or [employer].   You generally cannot be eligible for healthcare insurance through your employer.    How to apply: Eligibility screenings are held every Tuesday and Wednesday afternoon from 1:00 pm until 4:00 pm. You do not need an appointment for the interview!  [facility] [address], [place], [place]   [place]  [place] Department  [place]  [phone]    Behavioral Health Resources in the Community: Intensive Outpatient Programs Organization         Address  Phone  Notes  [place]. [address], [place], [place] [phone]   [facility] [address], [place], [place]   ADS: Alcohol & Drug Svcs [address] [place], [place]   [place] [address]  [place], [place] or [phone]   Substance Abuse Resources Organization         Address  Phone  Notes  Alcohol and Drug Services  [phone]   [place]  [phone]   The [place]   Chinita Pester  [phone]   Residential & Outpatient Substance Abuse Program  [phone]   Psychological Services Organization         Address  Phone  Notes  [facility] [place]  [place]  [phone]   [place] [address] [place] or [phone]    Mobile Crisis Teams Organization         Address  Phone  Notes  [facility]rapeutic Alternatives, Mobile  Crisis Care Unit  [phone]   [facility]ive Psychotherapeutic Services  [address] [place], [place]   [facility] [address] [place] [phone]    Self-Help/Support Groups Organization         Address  Phone             Notes  [place]. of [place] - variety of support groups  [place] Call for more information  Narcotics Anonymous (NA), [facility] [address], [employer] [place]  2 meetings at this location   Special educational needs teacher         Address  Phone  Notes  [facility]AP Residential Treatment [place],    [place]  [phone]   [facility]  [address], [place] [address], [place], [place]   [place], [place] [phone] Admissions: 8am-3pm M-F  Incentives Substance [place] [address],    [place], [place] [phone]   [facility] [address] [place], [place], [place]   [facility] [address],  [place], [place]   Insight Programs - Intensive Outpatient [address] Kristeen [address], [place], [place]   [facility] ([place].,  [place], [place] [phone] or [phone]   [facility] Services (RTS) [address], [place], [place] Accepts Medicaid  [facility] [place] [address]  [place] [phone] Substance Abuse/Addiction Treatment   [facility] Organization         Address  Phone  Notes  [facility]  [phone]   Domenic Schwab, PhD [address] Arlis Porta [place], [place]   [phone] or [phone]   [place], [place] [phone]   [facility] [address], [place], [place] [phone] Insurance/Medicaid/sponsorship through [facility] and Families [address] [place]                                    [place], [place] [phone] [place] [address] [place], [place] [phone]    Dr. Adele Schilder  [phone]   [facility] Dept. 1) [address] [place] 2) [place] 3)  [place] [address], [place] [phone]  [phone]   [place] [phone] or [phone] (After Hours)

## 2015-04-08 NOTE — ED Notes (Signed)
Natalie RN at bedside to attempt ultrasound IV.

## 2015-04-08 NOTE — ED Notes (Signed)
Pt reports Chest tightness that began this am along with SOB. Pt has audible wheezing. Also has bilateral leg swelling. Pt on lasix at home, but swelling continuing to get worse.

## 2015-04-08 NOTE — ED Provider Notes (Signed)
CSN: [id number]     Arrival date & time 04/08/15  1217 History   First MD Initiated Contact with Patient 04/08/15 1233     Chief Complaint  Patient presents with  . Chest Pain  . Shortness of Breath     (Consider location/radiation/quality/duration/timing/severity/associated sxs/prior Treatment) Patient is a 64 y.o. female presenting with chest pain and shortness of breath. The history is provided by the patient.  Chest Pain Associated symptoms: cough, fatigue and shortness of breath   Associated symptoms: no abdominal pain, no back pain, no fever and no headache   Shortness of Breath Associated symptoms: cough   Associated symptoms: no abdominal pain, no chest pain, no fever, no headaches and no rash    patient presents with bilateral leg swelling and shortness of breath. Felt the patient was wheezing upon arrival. Patient states that the leg swelling is been increasing over the past several days. Patient supposed to be on Lasix but it is a when necessary basis but probably has not been taking it because she says it gives her cramps. Patient with some very brief chest tightness no prolonged chest pain. Patient without history of congestive heart failure. Patient known to have renal insufficiency. Patient followed by nephrology.  Past Medical History  Diagnosis Date  . History of hip replacement, total   . Hx MRSA infection     abscess left groin  . Arthritis   . GERD (gastroesophageal reflux disease)   . Gout   . Diabetes mellitus   . Hypertension   . Thyroid disease   . Hyperlipidemia   . TIA (transient ischemic attack)      X2 NO RESIDUAL PROBLEMS  . Positive cardiac stress test 12/2013    anterior and lateral ischemia on Myoview  . Hx of cardiac cath 06/2014  . Asthma   . Headache   . Loosening of prosthetic hip   . Cramping of feet   . Stress incontinence   . History of transfusion   . Sleep apnea     UNABLE TO TOLERATE C PAP  . Chronic kidney disease    Past  Surgical History  Procedure Laterality Date  . Thyroidectomy    . Abdominal hysterectomy    . Laparoscopic cholecystectomy    . Forearm fracture surgery      Left arm  . Hernia repair      w/ mesh  . Incision and drainage abscess Left 02/04/2013    Procedure: INCISION AND DRAINAGE LEFT BUTTOCK ABSCESS; INCISION AND DRAINAGE LEFT BREAST ABSCESS;  Surgeon: Harl Bowie, MD;  Location: [place];  Service: General;  Laterality: Left;  . Irrigation and debridement abscess N/A 02/06/2013    Procedure: IRRIGATION AND DEBRIDEMENT BUTTOCK ABSCESS AND DRESSING CHANGE;  Surgeon: Harl Bowie, MD;  Location: [place];  Service: General;  Laterality: N/A;  . Irrigation and debridement abscess Left 02/08/2013    Procedure: IRRIGATION AND DEBRIDEMENT ABSCESS/DRESSING CHANGE;  Surgeon: Gwenyth Ober, MD;  Location: [place];  Service: General;  Laterality: Left;  . Incision and drainage perirectal abscess Left 02/10/2013    Procedure: IRRIGATION AND DEBRIDEMENT OF BUTTOCK/PERINEAL ABSCESS;  Surgeon: [name]. Georgette Dover, MD;  Location: [place];  Service: General;  Laterality: Left;  . Incision and drainage abscess N/A 02/12/2013    Procedure: INCISION AND DEBRIDEMENT BUTTOCK WOUND ;  Surgeon: [name]. Georgette Dover, MD;  Location: [place];  Service: General;  Laterality: N/A;  . Incision and drainage abscess N/A 02/14/2013  Procedure: INCISION AND DRAINAGE/DRESSING CHANGE;  Surgeon: Harl Bowie, MD;  Location: [place];  Service: General;  Laterality: N/A;  . Incision and drainage perirectal abscess N/A 02/16/2013    Procedure: IRRIGATION AND DEBRIDEMENT PERINEAL ABSCESS;  Surgeon: Zenovia Jarred, MD;  Location: [place];  Service: General;  Laterality: N/A;  . Joint replacement  2010 / 2012   . Colon surgery  1995    DUE TO POLYP  . Total hip revision Right 08/11/2014    Procedure: RIGHT ACETABULAR REVISION;  Surgeon: Gearlean Alf, MD;  Location: [facility];  Service: Orthopedics;  Laterality: Right;  . Left heart  catheterization with coronary angiogram N/A 07/02/2014    Procedure: LEFT HEART CATHETERIZATION WITH CORONARY ANGIOGRAM;  Surgeon: Lorretta Harp, MD;  Location: [facility];  Service: Cardiovascular;  Laterality: N/A;  . Incision and drainage abscess N/A 03/21/2015    Procedure: INCISION AND DRAINAGE PUBIC ABSCESS;  Surgeon: Excell Seltzer, MD;  Location: [facility];  Service: General;  Laterality: N/A;   Family History  Problem Relation Age of Onset  . Diabetes Brother   . Cardiomyopathy Mother    History  Substance Use Topics  . Smoking status: Current Every Day Smoker -- 0.25 packs/day for 40 years    Types: Cigarettes  . Smokeless tobacco: Never Used  . Alcohol Use: 0.5 oz/week    1 Standard drinks or equivalent per week     Comment: OCC   OB History    No data available     Review of Systems  Constitutional: Positive for fatigue. Negative for fever.  HENT: Positive for congestion.   Eyes: Negative for visual disturbance.  Respiratory: Positive for cough and shortness of breath.   Cardiovascular: Negative for chest pain.  Gastrointestinal: Negative for abdominal pain.  Genitourinary: Negative for dysuria.  Musculoskeletal: Negative for back pain.  Skin: Negative for rash.  Neurological: Negative for headaches.  Hematological: Does not bruise/bleed easily.  Psychiatric/Behavioral: Negative for confusion.      Allergies  Ace inhibitors; Bystolic; and Morphine and related  Home Medications   Prior to Admission medications   Medication Sig Start Date End Date Taking? Authorizing Provider  acetaminophen (TYLENOL) 325 MG tablet Take 2 tablets (650 mg total) by mouth every 6 (six) hours as needed for mild pain (or Fever >/= 101). 03/23/15  Yes Robbie Lis, MD  allopurinol (ZYLOPRIM) 100 MG tablet Take 100 mg by mouth every morning.    Yes Historical Provider, MD  B Complex Vitamins (VITAMIN B COMPLEX PO) Take 1 tablet by mouth 2 (two) times daily.    Yes Historical  Provider, MD  budesonide-formoterol (SYMBICORT) 160-4.5 MCG/ACT inhaler Inhale 2 puffs into the lungs 2 (two) times daily.   Yes Historical Provider, MD  BYSTOLIC 10 MG tablet Take 10 mg by mouth daily. 03/29/15  Yes Historical Provider, MD  cetirizine (ZYRTEC) 10 MG tablet Take 1 tablet by mouth daily as needed. allergies 03/14/15  Yes Historical Provider, MD  clopidogrel (PLAVIX) 75 MG tablet Take 75 mg by mouth daily.  06/13/14  Yes Historical Provider, MD  esomeprazole (NEXIUM) 40 MG capsule Take 40 mg by mouth daily at 12 noon.   Yes Historical Provider, MD  fenofibrate (TRICOR) 145 MG tablet Take 1 tablet (145 mg total) by mouth daily. 10/22/14  Yes Lorretta Harp, MD  fluticasone (FLONASE) 50 MCG/ACT nasal spray Place 2 sprays into the nose as needed for allergies.    Yes Historical Provider, MD  furosemide (LASIX) 40 MG tablet Take 40 mg by mouth daily. 03/31/15  Yes Historical Provider, MD  insulin aspart protamine- aspart (NOVOLOG MIX 70/30) (70-30) 100 UNIT/ML injection Inject 30-40 Units into the skin 2 (two) times daily with a meal. Take 40 units in the AM and 30 units at Night   Yes Historical Provider, MD  levothyroxine (SYNTHROID, LEVOTHROID) 137 MCG tablet Take 137 mcg by mouth daily before breakfast.   Yes Historical Provider, MD  losartan (COZAAR) 100 MG tablet Take 1 tablet (100 mg total) by mouth every morning. 08/05/14  Yes Lorretta Harp, MD  ONE TOUCH ULTRA TEST test strip 1 each by Other route 3 (three) times daily as needed (testing blood sugar.).  11/25/13  Yes Historical Provider, MD  oxyCODONE-acetaminophen (PERCOCET/ROXICET) 5-325 MG per tablet Take 1 tablet by mouth every 6 (six) hours as needed for moderate pain or severe pain.  01/14/15  Yes Historical Provider, MD  polyvinyl alcohol (LIQUIFILM TEARS) 1.4 % ophthalmic solution Place 2 drops into both eyes daily as needed for dry eyes.   Yes Historical Provider, MD  tiZANidine (ZANAFLEX) 4 MG capsule Take 4 mg by mouth 3  (three) times daily.   Yes Historical Provider, MD  VESICARE 10 MG tablet Take 10 mg by mouth every morning.  09/24/13  Yes Historical Provider, MD  doxycycline (ADOXA) 100 MG tablet Take 1 tablet (100 mg total) by mouth 2 (two) times daily. Patient not taking: Reported on 04/08/2015 03/23/15   Robbie Lis, MD   BP 154/56 mmHg  Pulse 67  Temp(Src) 97.8 F (36.6 C) (Oral)  Resp 24  SpO2 95% Physical Exam  Constitutional: She is oriented to person, place, and time. She appears well-developed and well-nourished. No distress.  HENT:  Head: Normocephalic and atraumatic.  Mouth/Throat: Oropharynx is clear and moist.  Eyes: Conjunctivae and EOM are normal. Pupils are equal, round, and reactive to light.  Neck: Normal range of motion.  Cardiovascular: Normal rate, regular rhythm and normal heart sounds.   Pulmonary/Chest: Effort normal and breath sounds normal.  Abdominal: Soft. Bowel sounds are normal. There is no tenderness.  Musculoskeletal: She exhibits edema.  Neurological: She is alert and oriented to person, place, and time. No cranial nerve deficit. She exhibits normal muscle tone. Coordination normal.  Skin: Skin is warm.  Nursing note and vitals reviewed.   ED Course  Procedures (including critical care time) Labs Review Labs Reviewed  CBC WITH DIFFERENTIAL/PLATELET - Abnormal; Notable for the following:    WBC 13.3 (*)    RBC 3.55 (*)    Hemoglobin 8.6 (*)    HCT 29.2 (*)    MCH 24.2 (*)    MCHC 29.5 (*)    RDW 19.9 (*)    Neutro Abs 10.2 (*)    All other components within normal limits  BASIC METABOLIC PANEL - Abnormal; Notable for the following:    Glucose, Bld 114 (*)    BUN 46 (*)    Creatinine, Ser 1.49 (*)    GFR calc non Af Amer 36 (*)    GFR calc Af Amer 42 (*)    All other components within normal limits  TROPONIN I - Abnormal; Notable for the following:    Troponin I 0.05 (*)    All other components within normal limits    Imaging Review Dg Chest 2  View  04/08/2015   CLINICAL DATA:  Initial encounter for shortness of breath on limb swelling.  EXAM: CHEST  2 VIEW  COMPARISON:  06/29/2014.  FINDINGS: Two views study limited by patient body habitus. The cardio pericardial silhouette is enlarged. There is pulmonary vascular congestion without overt pulmonary edema. Focal 3 cm opacity in the right apex concerning for neoplasm. Imaged bony structures of the thorax are intact.  IMPRESSION: 3 cm focal opacity in the right apex. Neoplasm is a distinct concern. CT chest recommended to further evaluate.  These results will be called to the ordering clinician or representative by the Radiologist Assistant, and communication documented in the PACS or zVision Dashboard.   Electronically Signed   By: Misty Stanley M.D.   On: 04/08/2015 13:43   Ct Chest Wo Contrast  04/08/2015   CLINICAL DATA:  Chest tightness today with shortness of breath an audible wheezing. Bilateral leg swelling. Initial encounter.  EXAM: CT CHEST WITHOUT CONTRAST  TECHNIQUE: Multidetector CT imaging of the chest was performed following the standard protocol without IV contrast.  COMPARISON:  Radiographs 06/29/2014 and today.  CT 08/15/2009.  FINDINGS: Mediastinum/Nodes: There are no enlarged mediastinal, hilar or axillary lymph nodes. There is a small hiatal hernia. There is stable cardiomegaly. No significant pericardial effusion. Minimal atherosclerosis of the aorta and coronary arteries noted.  Lungs/Pleura: There is no pleural effusion. 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 The lungs are clear.  Upper abdomen:  Diffuse hepatic steatosis.  No acute findings.  Musculoskeletal/Chest wall: There is no chest wall mass or suspicious osseous finding.  IMPRESSION: 1. No acute chest findings demonstrated on noncontrast imaging. 2. Stable cardiomegaly and mild atherosclerosis. 3. 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 4. Diffuse hepatic  steatosis.   Electronically Signed   By: Richardean Sale M.D.   On: 04/08/2015 14:56     EKG Interpretation   Date/Time:  Friday April 08 2015 12:32:42 EDT Ventricular Rate:  62 PR Interval:  163 QRS Duration: 120 QT Interval:  471 QTC Calculation: 478 R Axis:   -32 Text Interpretation:  Sinus rhythm Atrial premature complex Left  ventricular hypertrophy Borderline T abnormalities, diffuse leads No  significant change since last tracing Confirmed by Laqueshia Cihlar  MD, Boluwatife Mutchler  ([id number]) on 04/08/2015 12:38:23 PM      MDM   Final diagnoses:  SOB (shortness of breath)  Leg swelling    Patient presented with concerns for shortness of breath no real chest pain. And the bilateral leg swelling. Felt like she had swelling in her lungs. Patient is known to have renal insufficiency. Patient is on Lasix on a when necessary basis.   Workup here showed no evidence of any congestive heart failure or pulmonary edema. Regular plain x-ray raises concerns for a mass in the apex. CT of the chest showed that this was a lipomatous outside the lungs. Patient's renal function here today shows improvement from June 14. Patient's hemoglobin is a little low but still above 8 this down some from June. No history of any bleeding. Patient's oxygen saturations off oxygen are in the mid 90s. On oxygen she is in the upper 90s. Patient currently stable and can probably be discharged home. Patient will be gotten up on her feet and ambulated to make sure that she's doing okay. Patient prefers to go home. Patient once information to help find a new primary care doctor.   Patient's leg swelling probably due to not taking her Lasix on a regular basis.  Fredia Sorrow, MD 04/08/15 [phone]

## 2015-04-14 ENCOUNTER — Other Ambulatory Visit (HOSPITAL_COMMUNITY): Payer: Self-pay | Admitting: Internal Medicine

## 2015-04-14 DIAGNOSIS — R609 Edema, unspecified: Secondary | ICD-10-CM

## 2015-04-15 ENCOUNTER — Ambulatory Visit (HOSPITAL_COMMUNITY)
Admission: RE | Admit: 2015-04-15 | Discharge: 2015-04-15 | Disposition: A | Discharge status: Home / Self Care | Payer: Commercial Managed Care - HMO | Source: Ambulatory Visit | Attending: Internal Medicine | Admitting: Internal Medicine

## 2015-04-15 DIAGNOSIS — I10 Essential (primary) hypertension: Secondary | ICD-10-CM | POA: Diagnosis not present

## 2015-04-15 DIAGNOSIS — R609 Edema, unspecified: Secondary | ICD-10-CM | POA: Diagnosis not present

## 2015-04-15 DIAGNOSIS — E119 Type 2 diabetes mellitus without complications: Secondary | ICD-10-CM | POA: Insufficient documentation

## 2015-04-15 DIAGNOSIS — R0600 Dyspnea, unspecified: Secondary | ICD-10-CM | POA: Insufficient documentation

## 2015-04-15 DIAGNOSIS — E785 Hyperlipidemia, unspecified: Secondary | ICD-10-CM | POA: Diagnosis not present

## 2015-04-15 NOTE — Progress Notes (Signed)
  Echocardiogram 2D Echocardiogram has been performed.  Diamond Nickel 04/15/2015, 1:47 PM

## 2015-05-11 ENCOUNTER — Other Ambulatory Visit: Payer: Self-pay | Admitting: Cardiovascular Disease

## 2015-05-11 NOTE — Telephone Encounter (Signed)
REFILL 

## 2015-05-22 ENCOUNTER — Other Ambulatory Visit: Payer: Self-pay | Admitting: Podiatry

## 2015-09-19 ENCOUNTER — Other Ambulatory Visit: Payer: Self-pay

## 2015-09-19 DIAGNOSIS — Z1231 Encounter for screening mammogram for malignant neoplasm of breast: Secondary | ICD-10-CM

## 2015-09-30 ENCOUNTER — Other Ambulatory Visit: Payer: Self-pay | Admitting: Specialist

## 2015-09-30 DIAGNOSIS — M545 Low back pain: Secondary | ICD-10-CM

## 2015-10-05 ENCOUNTER — Other Ambulatory Visit: Payer: Self-pay | Admitting: Neurology

## 2015-10-12 ENCOUNTER — Ambulatory Visit: Payer: Commercial Managed Care - HMO | Attending: Physical Medicine and Rehabilitation

## 2015-10-12 DIAGNOSIS — R293 Abnormal posture: Secondary | ICD-10-CM | POA: Diagnosis present

## 2015-10-12 DIAGNOSIS — M6281 Muscle weakness (generalized): Secondary | ICD-10-CM | POA: Diagnosis present

## 2015-10-12 DIAGNOSIS — R29898 Other symptoms and signs involving the musculoskeletal system: Secondary | ICD-10-CM

## 2015-10-12 DIAGNOSIS — R6889 Other general symptoms and signs: Secondary | ICD-10-CM | POA: Diagnosis present

## 2015-10-12 DIAGNOSIS — M5442 Lumbago with sciatica, left side: Secondary | ICD-10-CM | POA: Insufficient documentation

## 2015-10-12 DIAGNOSIS — M6289 Other specified disorders of muscle: Secondary | ICD-10-CM | POA: Insufficient documentation

## 2015-10-12 NOTE — Therapy (Signed)
[place], [place], [address] Phone: [phone]   Fax:  [phone]  Physical Therapy Evaluation  Patient Details  Name: [name] [name] MRN: [id number] Date of Birth: [birth year] Referring Provider: Suella Broad, MD  Encounter Date: 10/12/2015      PT End of Session - 10/12/15 1308    Visit Number 1   Number of Visits 12   Date for PT Re-Evaluation 11/04/15   Authorization Type Medicare   PT Start Time 1225   PT Stop Time 1330   PT Time Calculation (min) 65 min   Activity Tolerance Patient limited by pain   Behavior During Therapy [facility] for tasks assessed/performed      Past Medical History  Diagnosis Date  . History of hip replacement, total   . Hx MRSA infection     abscess left groin  . Arthritis   . GERD (gastroesophageal reflux disease)   . Gout   . Diabetes mellitus   . Hypertension   . Thyroid disease   . Hyperlipidemia   . TIA (transient ischemic attack)      X2 NO RESIDUAL PROBLEMS  . Positive cardiac stress test 12/2013    anterior and lateral ischemia on Myoview  . Hx of cardiac cath 06/2014  . Asthma   . Headache   . Loosening of prosthetic hip   . Cramping of feet   . Stress incontinence   . History of transfusion   . Sleep apnea     UNABLE TO TOLERATE C PAP  . Chronic kidney disease     Past Surgical History  Procedure Laterality Date  . Thyroidectomy    . Abdominal hysterectomy    . Laparoscopic cholecystectomy    . Forearm fracture surgery      Left arm  . Hernia repair      w/ mesh  . Incision and drainage abscess Left 02/04/2013    Procedure: INCISION AND DRAINAGE LEFT BUTTOCK ABSCESS; INCISION AND DRAINAGE LEFT BREAST ABSCESS;  Surgeon: Harl Bowie, MD;  Location: [place];  Service: General;  Laterality: Left;  . Irrigation and debridement abscess N/A 02/06/2013    Procedure: IRRIGATION AND DEBRIDEMENT BUTTOCK ABSCESS AND DRESSING CHANGE;  Surgeon: Harl Bowie, MD;  Location: [place];  Service: General;  Laterality: N/A;  . Irrigation and debridement abscess Left 02/08/2013    Procedure: IRRIGATION AND DEBRIDEMENT ABSCESS/DRESSING CHANGE;  Surgeon: Gwenyth Ober, MD;  Location: [place];  Service: General;  Laterality: Left;  . Incision and drainage perirectal abscess Left 02/10/2013    Procedure: IRRIGATION AND DEBRIDEMENT OF BUTTOCK/PERINEAL ABSCESS;  Surgeon: [name]. Georgette Dover, MD;  Location: [place];  Service: General;  Laterality: Left;  . Incision and drainage abscess N/A 02/12/2013    Procedure: INCISION AND DEBRIDEMENT BUTTOCK WOUND ;  Surgeon: [name]. Georgette Dover, MD;  Location: [place];  Service: General;  Laterality: N/A;  . Incision and drainage abscess N/A 02/14/2013    Procedure: INCISION AND DRAINAGE/DRESSING CHANGE;  Surgeon: Harl Bowie, MD;  Location: [place];  Service: General;  Laterality: N/A;  . Incision and drainage perirectal abscess N/A 02/16/2013    Procedure: IRRIGATION AND DEBRIDEMENT PERINEAL ABSCESS;  Surgeon: Zenovia Jarred, MD;  Location: [place];  Service: General;  Laterality: N/A;  . Joint replacement  2010 / 2012   . Colon surgery  1995    DUE TO POLYP  . Total hip revision Right 08/11/2014    Procedure:  RIGHT ACETABULAR REVISION;  Surgeon: Gearlean Alf, MD;  [facility];  Service: Orthopedics;  Laterality: Right;  . Left heart catheterization with coronary angiogram N/A 07/02/2014    Procedure: LEFT HEART CATHETERIZATION WITH CORONARY ANGIOGRAM;  Surgeon: Lorretta Harp, MD;  Location: [facility];  Service: Cardiovascular;  Laterality: N/A;  . Incision and drainage abscess N/A 03/21/2015    Procedure: INCISION AND DRAINAGE PUBIC ABSCESS;  Surgeon: Excell Seltzer, MD;  [facility];  Service: General;  Laterality: N/A;    There were no vitals filed for this visit.  Visit Diagnosis:  Bilateral low back pain with left-sided sciatica  Activity intolerance  Abnormal posture  Weakness of both  hips  Weakness of trunk musculature      Subjective Assessment - 10/12/15 1231    Subjective She reports LBP with Posterior lateral LT leg pain. She reports lumbar stenosis pressing on nerves.  She began to have more pain post THA RT in 2015. She had 3 THA on same hp.    Pertinent History RT THA   Limitations Walking  Unable to do anythign without severe pain.    How long can you sit comfortably? She is able to sit 2 ours.    How long can you stand comfortably? 2 min   How long can you walk comfortably? 50-75 feet   Diagnostic tests MRI in future   Patient Stated Goals Decrease pain.    Currently in Pain? Yes   Pain Score 1   8-9/10 when on feet.    Pain Location Back   Pain Orientation Lower;Posterior;Right;Left  more to LT   Pain Descriptors / Indicators Throbbing  beating on  it   Pain Type Chronic pain   Pain Radiating Towards LT leg   Pain Onset More than a month ago   Pain Frequency Constant   Aggravating Factors  Standing and walking   Pain Relieving Factors Sitting   Effect of Pain on Daily Activities Limited in all areas   Multiple Pain Sites No            [facility] PT Assessment - 10/12/15 1239    Assessment   Medical Diagnosis Lumbar stenosis /DDD/ pain   Referring Provider Suella Broad, MD   Onset Date/Surgical Date --  She has chronic back pian over 6 years and worsening in past   Hand Dominance Right   Next MD Visit After 3rd week of PT   Prior Therapy None   Precautions   Precautions None   Required Braces or Orthoses --  No   Balance Screen   Has the patient fallen in the past 6 months No   Has the patient had a decrease in activity level because of a fear of falling?  --  Yes due to pain   Is the patient reluctant to leave their home because of a fear of falling?  No   Home Environment   Living Environment Private residence   Living Arrangements Alone   Type of [place] to enter   Entrance Stairs-Number of Steps One  rail 2 steps   Prior Function   Level of Independence Needs assistance with homemaking;Needs assistance with ADLs   Cognition   Overall Cognitive Status Within Functional Limits for tasks assessed   Posture/Postural Control   Posture Comments Flexed in trunk and hips.    ROM / Strength   AROM / PROM / Strength AROM;Strength   AROM  AROM Assessment Site Lumbar   Lumbar Flexion She is able to touch her toes   Lumbar Extension Unable to come to erect position   Lumbar - Right Side Bend 25   Lumbar - Left Side Bend 25   Lumbar - Right Rotation lower trunk decr 50%   Lumbar - Left Rotation lower trunk decr 50%   Strength   Overall Strength Comments Below hips 4+/5 , LT hip 4/5  RT 3/5  flexion  abduction RT 3-/5  LT 3+/5    Extension   3+/5 bilaterally    rotation RT 3+/5  RT 4/5  ,   Abdominals 4/5 , good pelvic tilt   Flexibility   Soft Tissue Assessment /Muscle Length yes   Hamstrings WNL with asssitance..    Palpation   Palpation comment Tender across lower back    Bed Mobility   Bed Mobility --  She did not need help but was having to make a strenuous eff   Transfers   Comments Need use of hands to go to mat from chair.    Ambulation/Gait   Ambulation Distance (Feet) 75 Feet   Assistive device None   Gait Pattern --  flexed in trunk and hips bilaterally     There Exer:  Abdominal setting with posterior pelvic tilt with tactile and verbal cues 15 reps, 3-5 sec hold              [facility] Adult PT Treatment/Exercise - 10/12/15 1239    Modalities   Modalities Electrical Stimulation;Moist Heat   Moist Heat Therapy   Number Minutes Moist Heat 20 Minutes   Moist Heat Location Lumbar Spine   Electrical Stimulation   Electrical Stimulation Location Lumbar spine   Electrical Stimulation Action IFC   Electrical Stimulation Parameters L15   Electrical Stimulation Goals Pain                PT Education - 10/12/15 1307    Education provided Yes   Education  Details POC   Person(s) Educated Patient   Methods Explanation   Comprehension Verbalized understanding          PT Short Term Goals - 10/12/15 1314    PT SHORT TERM GOAL #1   Title She will reort able to stand for 5 -8 min before need to sit.    Time 3   Period Weeks   Status New   PT SHORT TERM GOAL #2   Title She will be able to walk 150 feet before need to stop due to pain   Time 3   Period Weeks   Status New   PT SHORT TERM GOAL #3   Title She will be able to demo inital HEP   Time 3   Period Weeks   Status New   PT SHORT TERM GOAL #4   Title She will be able to walk in and out of clinic  , no need of wheel chair    Time 3   Period Weeks   Status New           PT Long Term Goals - 10/12/15 1315    PT LONG TERM GOAL #1   Title She will be able to perform all hEP as of last visit   Time 6   Period Weeks   Status New   PT LONG TERM GOAL #2   Title She will report pain decrased so she is able to be on feet 10-15 min before  need to sit   Time 6   Period Weeks   Status New   PT LONG TERM GOAL #3   Title She will be able to walk 200 feet or more without need to sit due to pain.    Time 6   Period Weeks   Status New               Plan - 10-28-15 1309    Clinical Impression Statement ms Runyan presents with sever difficulty with all mobility and home and selfcare tasks. She is most comfortable sitting . Weakness of hips and trunk expecially RT hip .   If her stenosis is bad she has limited prognosis  but she is  not doing any exercise for strength of LE and core so she needs to initiate this and be consistent at home.    Pt will benefit from skilled therapeutic intervention in order to improve on the following deficits Pain;Postural dysfunction;Increased muscle spasms;Decreased mobility;Decreased activity tolerance;Decreased range of motion;Decreased strength;Difficulty walking   Rehab Potential Fair   Clinical Impairments Affecting Rehab Potential  weakness, obesity, ultiple revisions of THA on LT   PT Frequency --  2x/week for first week then 3x/week for 2 weeks and if improving continue 2-3x/week for 2-3 weeks to see if improvement continues.    PT Treatment/Interventions Electrical Stimulation;Moist Heat;Therapeutic exercise;Patient/family education;Manual techniques;Taping;Passive range of motion   PT Next Visit Plan LE and core stability exercise and HEP , modalities if helpful   Consulted and Agree with Plan of Care Patient          G-Codes - October 28, 2015 1741    Functional Assessment Tool Used FOTO 65% limited   Functional Limitation Other PT primary   Other PT Primary Current Status (H9622) At least 60 percent but less than 80 percent impaired, limited or restricted   Other PT Primary Goal Status ([id number]) At least 40 percent but less than 60 percent impaired, limited or restricted       Problem List Patient Active Problem List   Diagnosis Date Noted  . Cellulitis of abdominal wall 03/21/2015  . Acute on chronic renal failure ([place]) 03/21/2015  . Type 2 diabetes mellitus ([place]) 03/21/2015  . Abdominal wall abscess 03/21/2015  . Failed total hip arthroplasty ([place]) 08/11/2014  . Low urine output 08/11/2014  . Chronic systolic CHF (congestive heart failure) ([place]) 08/11/2014  . Asthma, chronic 08/11/2014  . Hypothyroidism 08/11/2014  . OSA (obstructive sleep apnea) 08/11/2014  . Positive cardiac stress test 06/29/2014  . Hyperlipidemia 02/12/2014  . Left arm weakness 12/23/2013  . TIA (transient ischemic attack) 12/23/2013  . Left buttock abscess 10/12/2013  . Cellulitis and abscess of leg, right 04/16/2013  . Acute blood loss anemia 02/14/2013  . HTN (hypertension) 02/14/2013  . Cellulitis and abscess of buttock 02/09/2013  . Morbid obesity ([place]) 02/03/2013  . ARF (acute renal failure) ([place]) 02/03/2013  . Diarrhea 02/03/2013  . Hyponatremia 02/03/2013  . Hypokalemia 02/03/2013  . Sepsis ([place]) 02/02/2013  . DM  (diabetes mellitus) ([place]) 02/02/2013  . CKD (chronic kidney disease), stage III 02/02/2013  . Cellulitis 02/02/2013  . PAD (peripheral artery disease) ([place]) 04/26/2011  . Tobacco abuse 04/26/2011    Darrel Hoover PT 10-28-15, 5:42 PM  [facility] [address] [place], [place], [address] Phone: [phone]   Fax:  [phone]  Name: [name] [name] MRN: [id number] Date of Birth: [birth year]

## 2015-10-13 ENCOUNTER — Ambulatory Visit: Payer: Commercial Managed Care - HMO | Admitting: Physical Therapy

## 2015-10-13 VITALS — BP 160/100 | HR 104

## 2015-10-13 DIAGNOSIS — R293 Abnormal posture: Secondary | ICD-10-CM

## 2015-10-13 DIAGNOSIS — R6889 Other general symptoms and signs: Secondary | ICD-10-CM

## 2015-10-13 DIAGNOSIS — M5442 Lumbago with sciatica, left side: Secondary | ICD-10-CM

## 2015-10-13 NOTE — Patient Instructions (Signed)

## 2015-10-13 NOTE — Therapy (Signed)
[place], [place], [address] Phone: [phone]   Fax:  [phone]  Physical Therapy Treatment  Patient Details  Name: [name] [name] MRN: [id number] Date of Birth: Aug 01, 1951 Referring Provider: Suella Broad, MD  Encounter Date: 10/13/2015      PT End of Session - 10/13/15 1735    Visit Number 2   Number of Visits 12   Date for PT Re-Evaluation 11/04/15   PT Start Time 1632   PT Stop Time 1725   PT Time Calculation (min) 53 min   Activity Tolerance Patient tolerated treatment well   Behavior During Therapy [facility] for tasks assessed/performed      Past Medical History  Diagnosis Date  . History of hip replacement, total   . Hx MRSA infection     abscess left groin  . Arthritis   . GERD (gastroesophageal reflux disease)   . Gout   . Diabetes mellitus   . Hypertension   . Thyroid disease   . Hyperlipidemia   . TIA (transient ischemic attack)      X2 NO RESIDUAL PROBLEMS  . Positive cardiac stress test 12/2013    anterior and lateral ischemia on Myoview  . Hx of cardiac cath 06/2014  . Asthma   . Headache   . Loosening of prosthetic hip   . Cramping of feet   . Stress incontinence   . History of transfusion   . Sleep apnea     UNABLE TO TOLERATE C PAP  . Chronic kidney disease     Past Surgical History  Procedure Laterality Date  . Thyroidectomy    . Abdominal hysterectomy    . Laparoscopic cholecystectomy    . Forearm fracture surgery      Left arm  . Hernia repair      w/ mesh  . Incision and drainage abscess Left 02/04/2013    Procedure: INCISION AND DRAINAGE LEFT BUTTOCK ABSCESS; INCISION AND DRAINAGE LEFT BREAST ABSCESS;  Surgeon: Harl Bowie, MD;  Location: [place];  Service: General;  Laterality: Left;  . Irrigation and debridement abscess N/A 02/06/2013    Procedure: IRRIGATION AND DEBRIDEMENT BUTTOCK ABSCESS AND DRESSING CHANGE;  Surgeon: Harl Bowie, MD;   Location: [place];  Service: General;  Laterality: N/A;  . Irrigation and debridement abscess Left 02/08/2013    Procedure: IRRIGATION AND DEBRIDEMENT ABSCESS/DRESSING CHANGE;  Surgeon: Gwenyth Ober, MD;  Location: [place];  Service: General;  Laterality: Left;  . Incision and drainage perirectal abscess Left 02/10/2013    Procedure: IRRIGATION AND DEBRIDEMENT OF BUTTOCK/PERINEAL ABSCESS;  Surgeon: [name]. Georgette Dover, MD;  Location: [place];  Service: General;  Laterality: Left;  . Incision and drainage abscess N/A 02/12/2013    Procedure: INCISION AND DEBRIDEMENT BUTTOCK WOUND ;  Surgeon: [name]. Georgette Dover, MD;  Location: [place];  Service: General;  Laterality: N/A;  . Incision and drainage abscess N/A 02/14/2013    Procedure: INCISION AND DRAINAGE/DRESSING CHANGE;  Surgeon: Harl Bowie, MD;  Location: [place];  Service: General;  Laterality: N/A;  . Incision and drainage perirectal abscess N/A 02/16/2013    Procedure: IRRIGATION AND DEBRIDEMENT PERINEAL ABSCESS;  Surgeon: Zenovia Jarred, MD;  Location: [place];  Service: General;  Laterality: N/A;  . Joint replacement  2010 / 2012   . Colon surgery  1995    DUE TO POLYP  . Total hip revision Right 08/11/2014    Procedure: RIGHT ACETABULAR REVISION;  Surgeon:  Gearlean Alf, MD;  [facility];  Service: Orthopedics;  Laterality: Right;  . Left heart catheterization with coronary angiogram N/A 07/02/2014    Procedure: LEFT HEART CATHETERIZATION WITH CORONARY ANGIOGRAM;  Surgeon: Lorretta Harp, MD;  [facility];  Service: Cardiovascular;  Laterality: N/A;  . Incision and drainage abscess N/A 03/21/2015    Procedure: INCISION AND DRAINAGE PUBIC ABSCESS;  Surgeon: Excell Seltzer, MD;  [facility];  Service: General;  Laterality: N/A;    Filed Vitals:   10/13/15 1639  BP: 160/100  Pulse: 104  SpO2: 97%    Visit Diagnosis:  Bilateral low back pain with left-sided sciatica  Activity intolerance  Abnormal posture       Subjective Assessment - 10/13/15 1638    Subjective Has Asthma,  Short of breath walking to the door.              [facility] PT Assessment - 10/12/15 1239    Assessment   Medical Diagnosis Lumbar stenosis /DDD/ pain   Referring Provider Suella Broad, MD   Onset Date/Surgical Date --  She has chronic back pian over 6 years and worsening in past   Hand Dominance Right   Next MD Visit After 3rd week of PT   Prior Therapy None   Precautions   Precautions None   Required Braces or Orthoses --  No   Balance Screen   Has the patient fallen in the past 6 months No   Has the patient had a decrease in activity level because of a fear of falling?  --  Yes due to pain   Is the patient reluctant to leave their home because of a fear of falling?  No   Home Environment   Living Environment Private residence   Living Arrangements Alone   Type of [place] to enter   Entrance Stairs-Number of Steps One rail 2 steps   Prior Function   Level of Independence Needs assistance with homemaking;Needs assistance with ADLs   Cognition   Overall Cognitive Status Within Functional Limits for tasks assessed   Posture/Postural Control   Posture Comments Flexed in trunk and hips.    ROM / Strength   AROM / PROM / Strength AROM;Strength   AROM   AROM Assessment Site Lumbar   Lumbar Flexion She is able to touch her toes   Lumbar Extension Unable to come to erect position   Lumbar - Right Side Bend 25   Lumbar - Left Side Bend 25   Lumbar - Right Rotation lower trunk decr 50%   Lumbar - Left Rotation lower trunk decr 50%   Strength   Overall Strength Comments Below hips 4+/5 , LT hip 4/5  RT 3/5  flexion  abduction RT 3-/5  LT 3+/5    Extension   3+/5 bilaterally    rotation RT 3+/5  RT 4/5  ,   Abdominals 4/5 , good pelvic tilt   Flexibility   Soft Tissue Assessment /Muscle Length yes   Hamstrings WNL with asssitance..    Palpation   Palpation comment Tender across lower  back    Bed Mobility   Bed Mobility --  She did not need help but was having to make a strenuous eff   Transfers   Comments Need use of hands to go to mat from chair.    Ambulation/Gait   Ambulation Distance (Feet) 75 Feet   Assistive device None   Gait  Pattern --  flexed in trunk and hips bilaterally                     [facility] Adult PT Treatment/Exercise - 10/13/15 0001    Transfers   Transfer Cueing car, sit first then turn legs   Self-Care   Self-Care --  posture sitting, bed, car transfers   Modalities   Modalities Electrical Stimulation;Moist Heat   Moist Heat Therapy   Number Minutes Moist Heat 20 Minutes   Moist Heat Location Lumbar Spine   Electrical Stimulation   Electrical Stimulation Location Lumbar   Electrical Stimulation Action IFC   Electrical Stimulation Parameters 12   Electrical Stimulation Goals Pain                PT Education - 10/13/15 1710    Education provided --  ADL posture   Education Details Posture   Person(s) Educated Patient   Methods Explanation;Demonstration;Handout   Comprehension Verbalized understanding          PT Short Term Goals - 10/31/15 1314    PT SHORT TERM GOAL #1   Title She will reort able to stand for 5 -8 min before need to sit.    Time 3   Period Weeks   Status New   PT SHORT TERM GOAL #2   Title She will be able to walk 150 feet before need to stop due to pain   Time 3   Period Weeks   Status New   PT SHORT TERM GOAL #3   Title She will be able to demo inital HEP   Time 3   Period Weeks   Status New   PT SHORT TERM GOAL #4   Title She will be able to walk in and out of clinic  , no need of wheel chair    Time 3   Period Weeks   Status New           PT Long Term Goals - Oct 31, 2015 1315    PT LONG TERM GOAL #1   Title She will be able to perform all hEP as of last visit   Time 6   Period Weeks   Status New   PT LONG TERM GOAL #2   Title She will report pain decrased so she  is able to be on feet 10-15 min before need to sit   Time 6   Period Weeks   Status New   PT LONG TERM GOAL #3   Title She will be able to walk 200 feet or more without need to sit due to pain.    Time 6   Period Weeks   Status New               Plan - 10/13/15 1735    Clinical Impression Statement Patient having high BP/HR and shortness of breath. Patient did not want to go home she agreed to education and modalities.  Pain improved with modalities.  Patient was open to any suggestions re: posture.  I opted to pass on exercises today.   PT Next Visit Plan LE and core stability exercise and HEP , modalities if helpful   PT Home Exercise Plan practice good posture   Consulted and Agree with Plan of Care Patient          G-Codes - 2015-10-31 1741    Functional Assessment Tool Used FOTO 65% limited   Functional Limitation Other PT primary   Other  PT Primary Current Status [phone]) At least 60 percent but less than 80 percent impaired, limited or restricted   Other PT Primary Goal Status ([id number]) At least 40 percent but less than 60 percent impaired, limited or restricted      Problem List Patient Active Problem List   Diagnosis Date Noted  . Cellulitis of abdominal wall 03/21/2015  . Acute on chronic renal failure ([employer]) 03/21/2015  . Type 2 diabetes mellitus ([place]) 03/21/2015  . Abdominal wall abscess 03/21/2015  . Failed total hip arthroplasty ([place]) 08/11/2014  . Low urine output 08/11/2014  . Chronic systolic CHF (congestive heart failure) ([place]) 08/11/2014  . Asthma, chronic 08/11/2014  . Hypothyroidism 08/11/2014  . OSA (obstructive sleep apnea) 08/11/2014  . Positive cardiac stress test 06/29/2014  . Hyperlipidemia 02/12/2014  . Left arm weakness 12/23/2013  . TIA (transient ischemic attack) 12/23/2013  . Left buttock abscess 10/12/2013  . Cellulitis and abscess of leg, right 04/16/2013  . Acute blood loss anemia 02/14/2013  . HTN (hypertension) 02/14/2013  .  Cellulitis and abscess of buttock 02/09/2013  . Morbid obesity ([place]) 02/03/2013  . ARF (acute renal failure) ([place]) 02/03/2013  . Diarrhea 02/03/2013  . Hyponatremia 02/03/2013  . Hypokalemia 02/03/2013  . Sepsis ([place]) 02/02/2013  . DM (diabetes mellitus) ([place]) 02/02/2013  . CKD (chronic kidney disease), stage III 02/02/2013  . Cellulitis 02/02/2013  . PAD (peripheral artery disease) ([place]) 04/26/2011  . Tobacco abuse 04/26/2011    [name] [name] 10/13/2015, 5:39 PM  [facility] [address] [place], [place], [address] Phone: [phone]   Fax:  [phone]  Name: [name] [name] MRN: [id number] Date of Birth: 1950/12/01    Melvenia Needles, PTA 10/13/2015 5:39 PM Phone: [phone] Fax: [phone]

## 2015-10-17 ENCOUNTER — Ambulatory Visit: Payer: Commercial Managed Care - HMO | Admitting: Physical Therapy

## 2015-10-20 ENCOUNTER — Ambulatory Visit
Admission: RE | Admit: 2015-10-20 | Discharge: 2015-10-20 | Disposition: A | Discharge status: Home / Self Care | Payer: Commercial Managed Care - HMO | Source: Ambulatory Visit

## 2015-10-20 DIAGNOSIS — Z1231 Encounter for screening mammogram for malignant neoplasm of breast: Secondary | ICD-10-CM

## 2015-10-21 ENCOUNTER — Ambulatory Visit: Payer: Commercial Managed Care - HMO

## 2015-10-21 VITALS — BP 175/90 | HR 116 | Resp 36

## 2015-10-21 DIAGNOSIS — R6889 Other general symptoms and signs: Secondary | ICD-10-CM

## 2015-10-21 DIAGNOSIS — M5442 Lumbago with sciatica, left side: Secondary | ICD-10-CM

## 2015-10-21 NOTE — Patient Instructions (Signed)
A note was prepared with HR, BP readings and this was sent to her primary care MD whom she was seeing after this appointment

## 2015-10-21 NOTE — Therapy (Signed)
[place], [place], [address] Phone: [phone]   Fax:  [phone]  Physical Therapy Treatment  Patient Details  Name: [name] [name] MRN: [id number] Date of Birth: [birth year] Referring Provider: Suella Broad, MD  Encounter Date: 10/21/2015      PT End of Session - 10/21/15 1148    Visit Number 3   Number of Visits 12   Date for PT Re-Evaluation 11/04/15   PT Start Time 1105   PT Stop Time 1132   PT Time Calculation (min) 27 min   Activity Tolerance Patient tolerated treatment well   Behavior During Therapy [facility] for tasks assessed/performed      Past Medical History  Diagnosis Date  . History of hip replacement, total   . Hx MRSA infection     abscess left groin  . Arthritis   . GERD (gastroesophageal reflux disease)   . Gout   . Diabetes mellitus   . Hypertension   . Thyroid disease   . Hyperlipidemia   . TIA (transient ischemic attack)      X2 NO RESIDUAL PROBLEMS  . Positive cardiac stress test 12/2013    anterior and lateral ischemia on Myoview  . Hx of cardiac cath 06/2014  . Asthma   . Headache   . Loosening of prosthetic hip   . Cramping of feet   . Stress incontinence   . History of transfusion   . Sleep apnea     UNABLE TO TOLERATE C PAP  . Chronic kidney disease     Past Surgical History  Procedure Laterality Date  . Thyroidectomy    . Abdominal hysterectomy    . Laparoscopic cholecystectomy    . Forearm fracture surgery      Left arm  . Hernia repair      w/ mesh  . Incision and drainage abscess Left 02/04/2013    Procedure: INCISION AND DRAINAGE LEFT BUTTOCK ABSCESS; INCISION AND DRAINAGE LEFT BREAST ABSCESS;  Surgeon: Harl Bowie, MD;  Location: [place];  Service: General;  Laterality: Left;  . Irrigation and debridement abscess N/A 02/06/2013    Procedure: IRRIGATION AND DEBRIDEMENT BUTTOCK ABSCESS AND DRESSING CHANGE;  Surgeon: Harl Bowie, MD;   Location: [place];  Service: General;  Laterality: N/A;  . Irrigation and debridement abscess Left 02/08/2013    Procedure: IRRIGATION AND DEBRIDEMENT ABSCESS/DRESSING CHANGE;  Surgeon: Gwenyth Ober, MD;  Location: [place];  Service: General;  Laterality: Left;  . Incision and drainage perirectal abscess Left 02/10/2013    Procedure: IRRIGATION AND DEBRIDEMENT OF BUTTOCK/PERINEAL ABSCESS;  Surgeon: [name]. Georgette Dover, MD;  Location: [place];  Service: General;  Laterality: Left;  . Incision and drainage abscess N/A 02/12/2013    Procedure: INCISION AND DEBRIDEMENT BUTTOCK WOUND ;  Surgeon: [name]. Georgette Dover, MD;  Location: [place];  Service: General;  Laterality: N/A;  . Incision and drainage abscess N/A 02/14/2013    Procedure: INCISION AND DRAINAGE/DRESSING CHANGE;  Surgeon: Harl Bowie, MD;  Location: [place];  Service: General;  Laterality: N/A;  . Incision and drainage perirectal abscess N/A 02/16/2013    Procedure: IRRIGATION AND DEBRIDEMENT PERINEAL ABSCESS;  Surgeon: Zenovia Jarred, MD;  Location: [place];  Service: General;  Laterality: N/A;  . Joint replacement  2010 / 2012   . Colon surgery  1995    DUE TO POLYP  . Total hip revision Right 08/11/2014    Procedure: RIGHT ACETABULAR REVISION;  Surgeon:  Gearlean Alf, MD;  Location: [facility];  Service: Orthopedics;  Laterality: Right;  . Left heart catheterization with coronary angiogram N/A 07/02/2014    Procedure: LEFT HEART CATHETERIZATION WITH CORONARY ANGIOGRAM;  Surgeon: Lorretta Harp, MD;  [facility];  Service: Cardiovascular;  Laterality: N/A;  . Incision and drainage abscess N/A 03/21/2015    Procedure: INCISION AND DRAINAGE PUBIC ABSCESS;  Surgeon: Excell Seltzer, MD;  Location: [facility];  Service: General;  Laterality: N/A;    Filed Vitals:   10/21/15 1105  BP: 175/90  Pulse: 116  Resp: 36  SpO2: 94%    Visit Diagnosis:  Bilateral low back pain with left-sided sciatica  Activity intolerance      Subjective  Assessment - 10/21/15 1124    Subjective I think my high BP related to my pain. I need some help. Sitting is good, on feet is not.   Currently in Pain? Yes   Pain Score 2    Pain Location Back   Pain Orientation Lower;Posterior;Right;Left   Pain Descriptors / Indicators Throbbing   Pain Type Chronic pain   Pain Onset More than a month ago   Pain Frequency Constant   Aggravating Factors  Standing and walking   Pain Relieving Factors sitting   Multiple Pain Sites No                                 PT Education - 10/21/15 1147    Education provided Yes   Education Details I related the importance of controlled BP and how not doing this increased her likely hood of MI and CVA and when high we had to limit  the amount of exercise and that we could not see her for only heat and electric stim.    Person(s) Educated Patient   Methods Explanation   Comprehension Verbalized understanding          PT Short Term Goals - 10/21/15 1153    PT SHORT TERM GOAL #1   Title She will report able to stand for 5 -8 min before need to sit due to pain.    Status On-going   PT SHORT TERM GOAL #2   Title She will be able to walk 150 feet before need to stop due to pain   Status On-going   PT SHORT TERM GOAL #3   Title She will be able to demo inital HEP   Status On-going   PT SHORT TERM GOAL #4   Title She will be able to walk in and out of clinic  , no need of wheel chair or walker   Status On-going           PT Long Term Goals - 10/12/15 1315    PT LONG TERM GOAL #1   Title She will be able to perform all hEP as of last visit   Time 6   Period Weeks   Status New   PT LONG TERM GOAL #2   Title She will report pain decrased so she is able to be on feet 10-15 min before need to sit   Time 6   Period Weeks   Status New   PT LONG TERM GOAL #3   Title She will be able to walk 200 feet or more without need to sit due to pain.    Time 6   Period Weeks   Status New  Plan - 10/21/15 1149    Clinical Impression Statement With assessment of HR and BP and preping note to MD and limited time due to next appointment at 11:45 with MD  we did not do any treatment. She walked better with using WC for support. SOB by pt related to asthma but anxiety may contribute. A note related to BP was sent  with MS Stansbery to MD today.   PT Next Visit Plan Assess HR and BP and begin stretching and core strength exercise, HMP and electric stinm   Consulted and Agree with Plan of Care Patient        Problem List Patient Active Problem List   Diagnosis Date Noted  . Cellulitis of abdominal wall 03/21/2015  . Acute on chronic renal failure ([place]) 03/21/2015  . Type 2 diabetes mellitus ([place]) 03/21/2015  . Abdominal wall abscess 03/21/2015  . Failed total hip arthroplasty ([place]) 08/11/2014  . Low urine output 08/11/2014  . Chronic systolic CHF (congestive heart failure) ([place]) 08/11/2014  . Asthma, chronic 08/11/2014  . Hypothyroidism 08/11/2014  . OSA (obstructive sleep apnea) 08/11/2014  . Positive cardiac stress test 06/29/2014  . Hyperlipidemia 02/12/2014  . Left arm weakness 12/23/2013  . TIA (transient ischemic attack) 12/23/2013  . Left buttock abscess 10/12/2013  . Cellulitis and abscess of leg, right 04/16/2013  . Acute blood loss anemia 02/14/2013  . HTN (hypertension) 02/14/2013  . Cellulitis and abscess of buttock 02/09/2013  . Morbid obesity ([place]) 02/03/2013  . ARF (acute renal failure) ([place]) 02/03/2013  . Diarrhea 02/03/2013  . Hyponatremia 02/03/2013  . Hypokalemia 02/03/2013  . Sepsis ([place]) 02/02/2013  . DM (diabetes mellitus) ([place]) 02/02/2013  . CKD (chronic kidney disease), stage III 02/02/2013  . Cellulitis 02/02/2013  . PAD (peripheral artery disease) ([place]) 04/26/2011  . Tobacco abuse 04/26/2011    Darrel Hoover PT 10/21/2015, 11:55 AM  [facility] [address] [place], [place], [address] Phone: [phone]   Fax:  [phone]  Name: PALMINA CLODFELTER MRN: [id number] Date of Birth: May 22, 1951

## 2015-10-24 ENCOUNTER — Ambulatory Visit: Payer: Commercial Managed Care - HMO

## 2015-10-24 VITALS — BP 155/75 | HR 105 | Resp 15

## 2015-10-24 DIAGNOSIS — M5442 Lumbago with sciatica, left side: Secondary | ICD-10-CM

## 2015-10-24 DIAGNOSIS — M6281 Muscle weakness (generalized): Secondary | ICD-10-CM

## 2015-10-24 DIAGNOSIS — R293 Abnormal posture: Secondary | ICD-10-CM

## 2015-10-24 DIAGNOSIS — R6889 Other general symptoms and signs: Secondary | ICD-10-CM

## 2015-10-24 DIAGNOSIS — R29898 Other symptoms and signs involving the musculoskeletal system: Secondary | ICD-10-CM

## 2015-10-24 NOTE — Patient Instructions (Signed)
From cabinet ocre stab exercises with transverse abdom, pelvic floor and clams, bridge,  All 10-15 reps 2 x/day, and stretching knee to chest sit or supine  30 sec with strap supine. Sx/day 1-3 reps

## 2015-10-24 NOTE — Therapy (Signed)
[name], [place], [address] Phone: [phone]   Fax:  [phone]  Physical Therapy Treatment  Patient Details  Name: [name] [name] MRN: [id number] Date of Birth: [birth year] Referring Provider: Suella Broad, MD  Encounter Date: 10/24/2015      PT End of Session - 10/24/15 1315    Visit Number 4   Number of Visits 12   Date for PT Re-Evaluation 11/04/15   PT Start Time 1220   PT Stop Time 1323   PT Time Calculation (min) 63 min   Activity Tolerance Patient tolerated treatment well   Behavior During Therapy [facility] for tasks assessed/performed      Past Medical History  Diagnosis Date  . History of hip replacement, total   . Hx MRSA infection     abscess left groin  . Arthritis   . GERD (gastroesophageal reflux disease)   . Gout   . Diabetes mellitus   . Hypertension   . Thyroid disease   . Hyperlipidemia   . TIA (transient ischemic attack)      X2 NO RESIDUAL PROBLEMS  . Positive cardiac stress test 12/2013    anterior and lateral ischemia on Myoview  . Hx of cardiac cath 06/2014  . Asthma   . Headache   . Loosening of prosthetic hip   . Cramping of feet   . Stress incontinence   . History of transfusion   . Sleep apnea     UNABLE TO TOLERATE C PAP  . Chronic kidney disease     Past Surgical History  Procedure Laterality Date  . Thyroidectomy    . Abdominal hysterectomy    . Laparoscopic cholecystectomy    . Forearm fracture surgery      Left arm  . Hernia repair      w/ mesh  . Incision and drainage abscess Left 02/04/2013    Procedure: INCISION AND DRAINAGE LEFT BUTTOCK ABSCESS; INCISION AND DRAINAGE LEFT BREAST ABSCESS;  Surgeon: Harl Bowie, MD;  Location: [place];  Service: General;  Laterality: Left;  . Irrigation and debridement abscess N/A 02/06/2013    Procedure: IRRIGATION AND DEBRIDEMENT BUTTOCK ABSCESS AND DRESSING CHANGE;  Surgeon: Harl Bowie, MD;   Location: [place];  Service: General;  Laterality: N/A;  . Irrigation and debridement abscess Left 02/08/2013    Procedure: IRRIGATION AND DEBRIDEMENT ABSCESS/DRESSING CHANGE;  Surgeon: Gwenyth Ober, MD;  Location: [place];  Service: General;  Laterality: Left;  . Incision and drainage perirectal abscess Left 02/10/2013    Procedure: IRRIGATION AND DEBRIDEMENT OF BUTTOCK/PERINEAL ABSCESS;  Surgeon: [name]. Georgette Dover, MD;  Location: [place];  Service: General;  Laterality: Left;  . Incision and drainage abscess N/A 02/12/2013    Procedure: INCISION AND DEBRIDEMENT BUTTOCK WOUND ;  Surgeon: [name]. Georgette Dover, MD;  Location: [place];  Service: General;  Laterality: N/A;  . Incision and drainage abscess N/A 02/14/2013    Procedure: INCISION AND DRAINAGE/DRESSING CHANGE;  Surgeon: Harl Bowie, MD;  Location: [place];  Service: General;  Laterality: N/A;  . Incision and drainage perirectal abscess N/A 02/16/2013    Procedure: IRRIGATION AND DEBRIDEMENT PERINEAL ABSCESS;  Surgeon: Zenovia Jarred, MD;  Location: [place];  Service: General;  Laterality: N/A;  . Joint replacement  2010 / 2012   . Colon surgery  1995    DUE TO POLYP  . Total hip revision Right 08/11/2014    Procedure: RIGHT ACETABULAR REVISION;  Surgeon:  Gearlean Alf, MD;  [facility];  Service: Orthopedics;  Laterality: Right;  . Left heart catheterization with coronary angiogram N/A 07/02/2014    Procedure: LEFT HEART CATHETERIZATION WITH CORONARY ANGIOGRAM;  Surgeon: Lorretta Harp, MD;  Location: [facility] C[facility] LAB;  Service: Cardiovascular;  Laterality: N/A;  . Incision and drainage abscess N/A 03/21/2015    Procedure: INCISION AND DRAINAGE PUBIC ABSCESS;  Surgeon: Excell Seltzer, MD;  [facility];  Service: General;  Laterality: N/A;    Filed Vitals:   10/24/15 1234  BP: 155/75  Pulse: 105  Resp: 15    Visit Diagnosis:  Bilateral low back pain with left-sided sciatica  Activity intolerance  Abnormal posture  Weakness of  both hips  Weakness of trunk musculature      Subjective Assessment - 10/24/15 1235    Subjective MD gave me some BP medication (cannot remember name) and antibiotics for lung congestion   Currently in Pain? Yes   Pain Score 2   sitting Pain always worse with being on feet   Pain Location Back                         [facility] Adult PT Treatment/Exercise - 10/24/15 1238    Exercises   Exercises Lumbar   Lumbar Exercises: Stretches   Single Knee to Chest Stretch 2 reps;30 seconds  with strap   Lower Trunk Rotation 2 reps;30 seconds  RT and LT   Lumbar Exercises: Seated   Other Seated Lumbar Exercises pelivic floor x 8 reps 5 sec hold , cued to breathe and hip flexion stretches. 30 sec x1 RT and LT    Lumbar Exercises: Supine   Ab Set 15 reps   Bridge 15 reps   Moist Heat Therapy   Number Minutes Moist Heat 15 Minutes   Moist Heat Location Lumbar Spine     clams x 12 RT and LT done well after verbal and tactile cues, demo.            PT Education - 10/24/15 1315    Education provided Yes   Education Details HEP   Person(s) Educated Patient   Methods Explanation;Demonstration;Verbal cues;Handout;Tactile cues   Comprehension Returned demonstration;Verbalized understanding          PT Short Term Goals - 10/21/15 1153    PT SHORT TERM GOAL #1   Title She will report able to stand for 5 -8 min before need to sit due to pain.    Status On-going   PT SHORT TERM GOAL #2   Title She will be able to walk 150 feet before need to stop due to pain   Status On-going   PT SHORT TERM GOAL #3   Title She will be able to demo inital HEP   Status On-going   PT SHORT TERM GOAL #4   Title She will be able to walk in and out of clinic  , no need of wheel chair or walker   Status On-going           PT Long Term Goals - 10/12/15 1315    PT LONG TERM GOAL #1   Title She will be able to perform all hEP as of last visit   Time 6   Period Weeks   Status New    PT LONG TERM GOAL #2   Title She will report pain decrased so she is able to be on feet 10-15 min before need  to sit   Time 6   Period Weeks   Status New   PT LONG TERM GOAL #3   Title She will be able to walk 200 feet or more without need to sit due to pain.    Time 6   Period Weeks   Status New               Plan - 10/24/15 1316    Clinical Impression Statement Ms Dimitri now on BP meds and BP improved with diastolic  but not systolic. Will monitor this/ She had her RW today and she was able to walk back to gym with improved smoothness and less exertion. . She did well with exercise today   PT Next Visit Plan Assess HR and BP and begin stretching and core strength exercise, HMP    PT Home Exercise Plan cont HEP , add LTR    Consulted and Agree with Plan of Care Patient        Problem List Patient Active Problem List   Diagnosis Date Noted  . Cellulitis of abdominal wall 03/21/2015  . Acute on chronic renal failure ([place]) 03/21/2015  . Type 2 diabetes mellitus ([place]) 03/21/2015  . Abdominal wall abscess 03/21/2015  . Failed total hip arthroplasty ([place]) 08/11/2014  . Low urine output 08/11/2014  . Chronic systolic CHF (congestive heart failure) ([place]) 08/11/2014  . Asthma, chronic 08/11/2014  . Hypothyroidism 08/11/2014  . OSA (obstructive sleep apnea) 08/11/2014  . Positive cardiac stress test 06/29/2014  . Hyperlipidemia 02/12/2014  . Left arm weakness 12/23/2013  . TIA (transient ischemic attack) 12/23/2013  . Left buttock abscess 10/12/2013  . Cellulitis and abscess of leg, right 04/16/2013  . Acute blood loss anemia 02/14/2013  . HTN (hypertension) 02/14/2013  . Cellulitis and abscess of buttock 02/09/2013  . Morbid obesity ([place]) 02/03/2013  . ARF (acute renal failure) ([place]) 02/03/2013  . Diarrhea 02/03/2013  . Hyponatremia 02/03/2013  . Hypokalemia 02/03/2013  . Sepsis ([place]) 02/02/2013  . DM (diabetes mellitus) ([place]) 02/02/2013  . CKD (chronic kidney  disease), stage III 02/02/2013  . Cellulitis 02/02/2013  . PAD (peripheral artery disease) ([place]) 04/26/2011  . Tobacco abuse 04/26/2011    Darrel Hoover PT 10/24/2015, 1:19 PM  [facility] [address] [place], [place], [address] Phone: [phone]   Fax:  [phone]  Name: ROWENE SUTO MRN: [id number] Date of Birth: 03-07-1951

## 2015-10-26 ENCOUNTER — Ambulatory Visit: Payer: Commercial Managed Care - HMO

## 2015-10-26 DIAGNOSIS — M5442 Lumbago with sciatica, left side: Secondary | ICD-10-CM | POA: Diagnosis not present

## 2015-10-26 DIAGNOSIS — R293 Abnormal posture: Secondary | ICD-10-CM

## 2015-10-26 DIAGNOSIS — R6889 Other general symptoms and signs: Secondary | ICD-10-CM

## 2015-10-26 DIAGNOSIS — R29898 Other symptoms and signs involving the musculoskeletal system: Secondary | ICD-10-CM

## 2015-10-26 DIAGNOSIS — M6281 Muscle weakness (generalized): Secondary | ICD-10-CM

## 2015-10-26 NOTE — Therapy (Signed)
[place], [place], [address] Phone: [phone]   Fax:  [phone]  Physical Therapy Treatment  Patient Details  Name: [name] [name] MRN: [id number] Date of Birth: 10-10-1950 Referring Provider: Suella Broad, MD  Encounter Date: 10/26/2015      PT End of Session - 10/26/15 1317    Visit Number 5   Number of Visits 12   Date for PT Re-Evaluation 11/04/15   PT Start Time 1230   PT Stop Time 1330   PT Time Calculation (min) 60 min   Activity Tolerance Patient tolerated treatment well   Behavior During Therapy [facility] for tasks assessed/performed      Past Medical History  Diagnosis Date  . History of hip replacement, total   . Hx MRSA infection     abscess left groin  . Arthritis   . GERD (gastroesophageal reflux disease)   . Gout   . Diabetes mellitus   . Hypertension   . Thyroid disease   . Hyperlipidemia   . TIA (transient ischemic attack)      X2 NO RESIDUAL PROBLEMS  . Positive cardiac stress test 12/2013    anterior and lateral ischemia on Myoview  . Hx of cardiac cath 06/2014  . Asthma   . Headache   . Loosening of prosthetic hip   . Cramping of feet   . Stress incontinence   . History of transfusion   . Sleep apnea     UNABLE TO TOLERATE C PAP  . Chronic kidney disease     Past Surgical History  Procedure Laterality Date  . Thyroidectomy    . Abdominal hysterectomy    . Laparoscopic cholecystectomy    . Forearm fracture surgery      Left arm  . Hernia repair      w/ mesh  . Incision and drainage abscess Left 02/04/2013    Procedure: INCISION AND DRAINAGE LEFT BUTTOCK ABSCESS; INCISION AND DRAINAGE LEFT BREAST ABSCESS;  Surgeon: Harl Bowie, MD;  Location: [place];  Service: General;  Laterality: Left;  . Irrigation and debridement abscess N/A 02/06/2013    Procedure: IRRIGATION AND DEBRIDEMENT BUTTOCK ABSCESS AND DRESSING CHANGE;  Surgeon: Harl Bowie, MD;   Location: [place];  Service: General;  Laterality: N/A;  . Irrigation and debridement abscess Left 02/08/2013    Procedure: IRRIGATION AND DEBRIDEMENT ABSCESS/DRESSING CHANGE;  Surgeon: Gwenyth Ober, MD;  Location: [place];  Service: General;  Laterality: Left;  . Incision and drainage perirectal abscess Left 02/10/2013    Procedure: IRRIGATION AND DEBRIDEMENT OF BUTTOCK/PERINEAL ABSCESS;  Surgeon: [name]. Georgette Dover, MD;  Location: [place];  Service: General;  Laterality: Left;  . Incision and drainage abscess N/A 02/12/2013    Procedure: INCISION AND DEBRIDEMENT BUTTOCK WOUND ;  Surgeon: [name]. Georgette Dover, MD;  Location: [place];  Service: General;  Laterality: N/A;  . Incision and drainage abscess N/A 02/14/2013    Procedure: INCISION AND DRAINAGE/DRESSING CHANGE;  Surgeon: Harl Bowie, MD;  Location: [place];  Service: General;  Laterality: N/A;  . Incision and drainage perirectal abscess N/A 02/16/2013    Procedure: IRRIGATION AND DEBRIDEMENT PERINEAL ABSCESS;  Surgeon: Zenovia Jarred, MD;  Location: [place];  Service: General;  Laterality: N/A;  . Joint replacement  2010 / 2012   . Colon surgery  1995    DUE TO POLYP  . Total hip revision Right 08/11/2014    Procedure: RIGHT ACETABULAR REVISION;  Surgeon:  Gearlean Alf, MD;  Location: [facility];  Service: Orthopedics;  Laterality: Right;  . Left heart catheterization with coronary angiogram N/A 07/02/2014    Procedure: LEFT HEART CATHETERIZATION WITH CORONARY ANGIOGRAM;  Surgeon: Lorretta Harp, MD;  Location: [facility];  Service: Cardiovascular;  Laterality: N/A;  . Incision and drainage abscess N/A 03/21/2015    Procedure: INCISION AND DRAINAGE PUBIC ABSCESS;  Surgeon: Excell Seltzer, MD;  Location: [facility];  Service: General;  Laterality: N/A;    There were no vitals filed for this visit.  Visit Diagnosis:  Bilateral low back pain with left-sided sciatica  Activity intolerance  Abnormal posture  Weakness of both hips  Weakness of trunk  musculature      Subjective Assessment - 10/26/15 1247    Subjective Edarbi 40 mg was new medication fro MD. no functional or pain improvement   Currently in Pain? Yes   Pain Score 5    Pain Location Back   Pain Orientation Lower;Posterior   Pain Descriptors / Indicators Throbbing   Pain Type Chronic pain   Pain Radiating Towards LT leg   Pain Onset More than a month ago   Pain Frequency Constant   Multiple Pain Sites No  Knees felt stiff after nustep and HR 95 post                         [facility] Adult PT Treatment/Exercise - 10/26/15 1250    Ambulation/Gait   Ambulation Distance (Feet) 75 Feet  then 30 feet  HR 122 bpm   Assistive device Rolling walker   Gait Pattern Step-through pattern   Ambulation Surface Level;Indoor   Gait Comments then 60 feet x 3 with HR max 126 bpm with 2 min rest with best HR 96 bpm   Lumbar Exercises: Aerobic   Stationary Bike Nustep legs only L4 5 min   Lumbar Exercises: Seated   Other Seated Lumbar Exercises We worked for 20 min with pelvic floor and transverse abdominus contraction by it self then with arm and leg movement with cues to keep trunk erect.    Moist Heat Therapy   Number Minutes Moist Heat 15 Minutes   Moist Heat Location Lumbar Spine                PT Education - 10/26/15 1316    Education provided Yes   Education Details Followec up with lower core contration and added extremity movements   Person(s) Educated Patient   Methods Explanation;Demonstration;Verbal cues;Tactile cues   Comprehension Returned demonstration          PT Short Term Goals - 10/26/15 1319    PT SHORT TERM GOAL #1   Title She will report able to stand for 5 -8 min before need to sit due to pain.    Status On-going   PT SHORT TERM GOAL #2   Title She will be able to walk 150 feet before need to stop due to pain   Status On-going   PT SHORT TERM GOAL #3   Title She will be able to demo inital HEP   Status On-going   PT  SHORT TERM GOAL #4   Title She will be able to walk in and out of clinic  , no need of wheel chair or walker   Status On-going           PT Long Term Goals - 10/12/15 1315    PT LONG TERM GOAL #  1   Title She will be able to perform all hEP as of last visit   Time 6   Period Weeks   Status New   PT LONG TERM GOAL #2   Title She will report pain decrased so she is able to be on feet 10-15 min before need to sit   Time 6   Period Weeks   Status New   PT LONG TERM GOAL #3   Title She will be able to walk 200 feet or more without need to sit due to pain.    Time 6   Period Weeks   Status New               Plan - 10/26/15 1317    Clinical Impression Statement Today HR and BP still elevated with wlaking but we did some for general conditioning. She did well with lower abdominals and pelvic floor contractin and movements. She appears to understand as when I asked for less contraction she was able to adjust.    PT Next Visit Plan Assess HR and BP and begin stretching and core strength exercise, HMP    PT Home Exercise Plan cont HEP , add LTR    Consulted and Agree with Plan of Care Patient        Problem List Patient Active Problem List   Diagnosis Date Noted  . Cellulitis of abdominal wall 03/21/2015  . Acute on chronic renal failure ([place]) 03/21/2015  . Type 2 diabetes mellitus ([place]) 03/21/2015  . Abdominal wall abscess 03/21/2015  . Failed total hip arthroplasty ([place]) 08/11/2014  . Low urine output 08/11/2014  . Chronic systolic CHF (congestive heart failure) ([place]) 08/11/2014  . Asthma, chronic 08/11/2014  . Hypothyroidism 08/11/2014  . OSA (obstructive sleep apnea) 08/11/2014  . Positive cardiac stress test 06/29/2014  . Hyperlipidemia 02/12/2014  . Left arm weakness 12/23/2013  . TIA (transient ischemic attack) 12/23/2013  . Left buttock abscess 10/12/2013  . Cellulitis and abscess of leg, right 04/16/2013  . Acute blood loss anemia 02/14/2013  . HTN  (hypertension) 02/14/2013  . Cellulitis and abscess of buttock 02/09/2013  . Morbid obesity ([place]) 02/03/2013  . ARF (acute renal failure) ([place]) 02/03/2013  . Diarrhea 02/03/2013  . Hyponatremia 02/03/2013  . Hypokalemia 02/03/2013  . Sepsis ([place]) 02/02/2013  . DM (diabetes mellitus) ([place]) 02/02/2013  . CKD (chronic kidney disease), stage III 02/02/2013  . Cellulitis 02/02/2013  . PAD (peripheral artery disease) ([place]) 04/26/2011  . Tobacco abuse 04/26/2011    Darrel Hoover PT 10/26/2015, 1:28 PM  [facility] [address] [place], [place], [address] Phone: [phone]   Fax:  [phone]  Name: [name] [name] MRN: [id number] Date of Birth: 10/13/1950

## 2015-10-28 ENCOUNTER — Ambulatory Visit: Payer: Commercial Managed Care - HMO

## 2015-10-28 VITALS — BP 165/90 | HR 107

## 2015-10-28 DIAGNOSIS — M5442 Lumbago with sciatica, left side: Secondary | ICD-10-CM

## 2015-10-28 DIAGNOSIS — R6889 Other general symptoms and signs: Secondary | ICD-10-CM

## 2015-10-28 DIAGNOSIS — R29898 Other symptoms and signs involving the musculoskeletal system: Secondary | ICD-10-CM

## 2015-10-28 DIAGNOSIS — R293 Abnormal posture: Secondary | ICD-10-CM

## 2015-10-28 NOTE — Therapy (Signed)
[place], [place], [address] Phone: [phone]   Fax:  [phone]  Physical Therapy Treatment  Patient Details  Name: [name] [name] MRN: [id number] Date of Birth: Feb 04, 1951 Referring Provider: Suella Broad, MD  Encounter Date: 10/28/2015      PT End of Session - 10/28/15 1151    Visit Number 6   Number of Visits 12   Authorization Time Period KX at visit 15   PT Start Time 1100   PT Stop Time 1200   PT Time Calculation (min) 60 min   Activity Tolerance Patient tolerated treatment well   Behavior During Therapy [facility] for tasks assessed/performed      Past Medical History  Diagnosis Date  . History of hip replacement, total   . Hx MRSA infection     abscess left groin  . Arthritis   . GERD (gastroesophageal reflux disease)   . Gout   . Diabetes mellitus   . Hypertension   . Thyroid disease   . Hyperlipidemia   . TIA (transient ischemic attack)      X2 NO RESIDUAL PROBLEMS  . Positive cardiac stress test 12/2013    anterior and lateral ischemia on Myoview  . Hx of cardiac cath 06/2014  . Asthma   . Headache   . Loosening of prosthetic hip   . Cramping of feet   . Stress incontinence   . History of transfusion   . Sleep apnea     UNABLE TO TOLERATE C PAP  . Chronic kidney disease     Past Surgical History  Procedure Laterality Date  . Thyroidectomy    . Abdominal hysterectomy    . Laparoscopic cholecystectomy    . Forearm fracture surgery      Left arm  . Hernia repair      w/ mesh  . Incision and drainage abscess Left 02/04/2013    Procedure: INCISION AND DRAINAGE LEFT BUTTOCK ABSCESS; INCISION AND DRAINAGE LEFT BREAST ABSCESS;  Surgeon: Harl Bowie, MD;  Location: [place];  Service: General;  Laterality: Left;  . Irrigation and debridement abscess N/A 02/06/2013    Procedure: IRRIGATION AND DEBRIDEMENT BUTTOCK ABSCESS AND DRESSING CHANGE;  Surgeon: Harl Bowie, MD;   Location: [place];  Service: General;  Laterality: N/A;  . Irrigation and debridement abscess Left 02/08/2013    Procedure: IRRIGATION AND DEBRIDEMENT ABSCESS/DRESSING CHANGE;  Surgeon: Gwenyth Ober, MD;  Location: [place];  Service: General;  Laterality: Left;  . Incision and drainage perirectal abscess Left 02/10/2013    Procedure: IRRIGATION AND DEBRIDEMENT OF BUTTOCK/PERINEAL ABSCESS;  Surgeon: [name]. Georgette Dover, MD;  Location: [place];  Service: General;  Laterality: Left;  . Incision and drainage abscess N/A 02/12/2013    Procedure: INCISION AND DEBRIDEMENT BUTTOCK WOUND ;  Surgeon: [name]. Georgette Dover, MD;  Location: [place];  Service: General;  Laterality: N/A;  . Incision and drainage abscess N/A 02/14/2013    Procedure: INCISION AND DRAINAGE/DRESSING CHANGE;  Surgeon: Harl Bowie, MD;  Location: [place];  Service: General;  Laterality: N/A;  . Incision and drainage perirectal abscess N/A 02/16/2013    Procedure: IRRIGATION AND DEBRIDEMENT PERINEAL ABSCESS;  Surgeon: Zenovia Jarred, MD;  Location: [place];  Service: General;  Laterality: N/A;  . Joint replacement  2010 / 2012   . Colon surgery  1995    DUE TO POLYP  . Total hip revision Right 08/11/2014    Procedure: RIGHT ACETABULAR REVISION;  Surgeon: Gearlean Alf, MD;  Location[facility];  Service: Orthopedics;  Laterality: Right;  . Left heart catheterization with coronary angiogram N/A 07/02/2014    Procedure: LEFT HEART CATHETERIZATION WITH CORONARY ANGIOGRAM;  Surgeon: Lorretta Harp, MD;  Location[facility];  Service: Cardiovascular;  Laterality: N/A;  . Incision and drainage abscess N/A 03/21/2015    Procedure: INCISION AND DRAINAGE PUBIC ABSCESS;  Surgeon: Excell Seltzer, MD;  Location[facility];  Service: General;  Laterality: N/A;    Filed Vitals:   10/28/15 1123  BP: 165/90  Pulse: 107    Visit Diagnosis:  Bilateral low back pain with left-sided sciatica  Activity intolerance  Abnormal posture  Weakness of both hips       Subjective Assessment - 10/28/15 1123    Subjective Pain is better 2/10 but BP may be high as I've had some bad news.    Currently in Pain? Yes   Pain Score 2    Pain Location Back   Pain Orientation Lower;Posterior   Pain Descriptors / Indicators Throbbing   Pain Type Chronic pain   Pain Frequency Constant   Aggravating Factors  stand and walking   Pain Relieving Factors sitting   Multiple Pain Sites No                         [facility] Adult PT Treatment/Exercise - 10/28/15 1125    Lumbar Exercises: Aerobic   Stationary Bike Nustep legs only L4 5 min  pst this HR 111 and BP 178/90   Lumbar Exercises: Seated   Other Seated Lumbar Exercises Worked again with abdominal sets with movement of UE and LE multiple reps each 2 sets  with 2 sets of flexion stretch over knees 20 sec each. She had hard time breathing so kept stretch < 30 sec. Then rotation stretch RT and Lt 10 sec. She had a cramp RT side from rotating too far so stopped this.  and with adduction/abduction  5 set of 3 reps and with LE flex to extension.   HR was105 bpm and BP was 160/90 after exercise   Moist Heat Therapy   Number Minutes Moist Heat 15 Minutes   Moist Heat Location Lumbar Spine                  PT Short Term Goals - 10/26/15 1319    PT SHORT TERM GOAL #1   Title She will report able to stand for 5 -8 min before need to sit due to pain.    Status On-going   PT SHORT TERM GOAL #2   Title She will be able to walk 150 feet before need to stop due to pain   Status On-going   PT SHORT TERM GOAL #3   Title She will be able to demo inital HEP   Status On-going   PT SHORT TERM GOAL #4   Title She will be able to walk in and out of clinic  , no need of wheel chair or walker   Status On-going           PT Long Term Goals - 10/12/15 1315    PT LONG TERM GOAL #1   Title She will be able to perform all hEP as of last visit   Time 6   Period Weeks   Status New   PT LONG TERM GOAL  #2   Title She will report pain decrased so she is  able to be on feet 10-15 min before need to sit   Time 6   Period Weeks   Status New   PT LONG TERM GOAL #3   Title She will be able to walk 200 feet or more without need to sit due to pain.    Time 6   Period Weeks   Status New               Plan - 10/28/15 1152    Clinical Impression Statement It appears her pain has improved . BP and HR are still elevated and increases with even the shortest walk of 25 to 50 feet. Will contniue to work of core strength and stretching, HMP PRN   Pt will benefit from skilled therapeutic intervention in order to improve on the following deficits Pain;Postural dysfunction;Increased muscle spasms;Decreased mobility;Decreased activity tolerance;Decreased range of motion;Decreased strength;Difficulty walking   PT Next Visit Plan Assess HR and BP and begin stretching and core strength exercise, HMP    Consulted and Agree with Plan of Care Patient     Before leaving HR 104  BP 162/90   Problem List Patient Active Problem List   Diagnosis Date Noted  . Cellulitis of abdominal wall 03/21/2015  . Acute on chronic renal failure ([place]) 03/21/2015  . Type 2 diabetes mellitus ([place]) 03/21/2015  . Abdominal wall abscess 03/21/2015  . Failed total hip arthroplasty ([place]) 08/11/2014  . Low urine output 08/11/2014  . Chronic systolic CHF (congestive heart failure) ([place]) 08/11/2014  . Asthma, chronic 08/11/2014  . Hypothyroidism 08/11/2014  . OSA (obstructive sleep apnea) 08/11/2014  . Positive cardiac stress test 06/29/2014  . Hyperlipidemia 02/12/2014  . Left arm weakness 12/23/2013  . TIA (transient ischemic attack) 12/23/2013  . Left buttock abscess 10/12/2013  . Cellulitis and abscess of leg, right 04/16/2013  . Acute blood loss anemia 02/14/2013  . HTN (hypertension) 02/14/2013  . Cellulitis and abscess of buttock 02/09/2013  . Morbid obesity ([place]) 02/03/2013  . ARF (acute renal failure) ([place])  02/03/2013  . Diarrhea 02/03/2013  . Hyponatremia 02/03/2013  . Hypokalemia 02/03/2013  . Sepsis ([place]) 02/02/2013  . DM (diabetes mellitus) ([place]) 02/02/2013  . CKD (chronic kidney disease), stage III 02/02/2013  . Cellulitis 02/02/2013  . PAD (peripheral artery disease) ([place]) 04/26/2011  . Tobacco abuse 04/26/2011    Darrel Hoover PT 10/28/2015, 11:55 AM  [facility] [address] [place], [place], [address] Phone: [phone]   Fax:  [phone]  Name: WENDIE DISKIN MRN: [id number] Date of Birth: November 29, 1950

## 2015-10-31 ENCOUNTER — Ambulatory Visit: Payer: Commercial Managed Care - HMO

## 2015-10-31 VITALS — BP 158/85 | HR 104

## 2015-10-31 DIAGNOSIS — R6889 Other general symptoms and signs: Secondary | ICD-10-CM

## 2015-10-31 DIAGNOSIS — M6281 Muscle weakness (generalized): Secondary | ICD-10-CM

## 2015-10-31 DIAGNOSIS — M5442 Lumbago with sciatica, left side: Secondary | ICD-10-CM

## 2015-10-31 DIAGNOSIS — R29898 Other symptoms and signs involving the musculoskeletal system: Secondary | ICD-10-CM

## 2015-10-31 NOTE — Therapy (Signed)
[place], [place], [address] Phone: [phone]   Fax:  [phone]  Physical Therapy Treatment  Patient Details  Name: [name] [name] MRN: [id number] Date of Birth: 01/15/1951 Referring Provider: Suella Broad, MD  Encounter Date: 10/31/2015      PT End of Session - 10/31/15 1320    Visit Number 6   Number of Visits 12   Date for PT Re-Evaluation 11/04/15   PT Start Time 1225   PT Stop Time 1325   PT Time Calculation (min) 60 min   Activity Tolerance Treatment limited secondary to medical complications (Comment)   Behavior During Therapy [facility] for tasks assessed/performed      Past Medical History  Diagnosis Date  . History of hip replacement, total   . Hx MRSA infection     abscess left groin  . Arthritis   . GERD (gastroesophageal reflux disease)   . Gout   . Diabetes mellitus   . Hypertension   . Thyroid disease   . Hyperlipidemia   . TIA (transient ischemic attack)      X2 NO RESIDUAL PROBLEMS  . Positive cardiac stress test 12/2013    anterior and lateral ischemia on Myoview  . Hx of cardiac cath 06/2014  . Asthma   . Headache   . Loosening of prosthetic hip   . Cramping of feet   . Stress incontinence   . History of transfusion   . Sleep apnea     UNABLE TO TOLERATE C PAP  . Chronic kidney disease     Past Surgical History  Procedure Laterality Date  . Thyroidectomy    . Abdominal hysterectomy    . Laparoscopic cholecystectomy    . Forearm fracture surgery      Left arm  . Hernia repair      w/ mesh  . Incision and drainage abscess Left 02/04/2013    Procedure: INCISION AND DRAINAGE LEFT BUTTOCK ABSCESS; INCISION AND DRAINAGE LEFT BREAST ABSCESS;  Surgeon: Harl Bowie, MD;  Location: [place];  Service: General;  Laterality: Left;  . Irrigation and debridement abscess N/A 02/06/2013    Procedure: IRRIGATION AND DEBRIDEMENT BUTTOCK ABSCESS AND DRESSING CHANGE;  Surgeon:  Harl Bowie, MD;  Location: [place];  Service: General;  Laterality: N/A;  . Irrigation and debridement abscess Left 02/08/2013    Procedure: IRRIGATION AND DEBRIDEMENT ABSCESS/DRESSING CHANGE;  Surgeon: Gwenyth Ober, MD;  Location: [place];  Service: General;  Laterality: Left;  . Incision and drainage perirectal abscess Left 02/10/2013    Procedure: IRRIGATION AND DEBRIDEMENT OF BUTTOCK/PERINEAL ABSCESS;  Surgeon: [name]. Georgette Dover, MD;  Location: [place];  Service: General;  Laterality: Left;  . Incision and drainage abscess N/A 02/12/2013    Procedure: INCISION AND DEBRIDEMENT BUTTOCK WOUND ;  Surgeon: [name]. Georgette Dover, MD;  Location: [place];  Service: General;  Laterality: N/A;  . Incision and drainage abscess N/A 02/14/2013    Procedure: INCISION AND DRAINAGE/DRESSING CHANGE;  Surgeon: Harl Bowie, MD;  Location: [place];  Service: General;  Laterality: N/A;  . Incision and drainage perirectal abscess N/A 02/16/2013    Procedure: IRRIGATION AND DEBRIDEMENT PERINEAL ABSCESS;  Surgeon: Zenovia Jarred, MD;  Location: [place];  Service: General;  Laterality: N/A;  . Joint replacement  2010 / 2012   . Colon surgery  1995    DUE TO POLYP  . Total hip revision Right 08/11/2014    Procedure: RIGHT ACETABULAR  REVISION;  Surgeon: Gearlean Alf, MD;  [facility];  Service: Orthopedics;  Laterality: Right;  . Left heart catheterization with coronary angiogram N/A 07/02/2014    Procedure: LEFT HEART CATHETERIZATION WITH CORONARY ANGIOGRAM;  Surgeon: Lorretta Harp, MD;  Location: [facility] CATH LAB;  Service: Cardiovascular;  Laterality: N/A;  . Incision and drainage abscess N/A 03/21/2015    Procedure: INCISION AND DRAINAGE PUBIC ABSCESS;  Surgeon: Excell Seltzer, MD;  [facility];  Service: General;  Laterality: N/A;    Filed Vitals:   10/31/15 1230  BP: 158/85  Pulse: 104    Visit Diagnosis:  Bilateral low back pain with left-sided sciatica  Activity intolerance  Weakness of both  hips  Weakness of trunk musculature      Subjective Assessment - 10/31/15 1230    Subjective LBP 2/10, Shoulder RT>LT 8/10.  Worse since yesterday. She says she has done nothing different. She reports OA.      HR 118 bpm and BP 168/82 after walking back 75 feet.    Currently in Pain? Yes   Pain Score 2    Pain Location Back   Pain Orientation Posterior;Lower   Pain Descriptors / Indicators Aching   Pain Type Chronic pain   Pain Onset More than a month ago   Pain Frequency Constant   Multiple Pain Sites Yes  RT shoulder 8/10                         [facility] Adult PT Treatment/Exercise - 10/31/15 1234    Lumbar Exercises: Aerobic   Stationary Bike Nustep legs only L4 5 min  3 sets  worse HE 120 bpm, BP 162/88 all sets   Lumbar Exercises: Seated   Other Seated Lumbar Exercises Worked again with abdominal sets with movement   LE 10 reps leg circles LT and RT  each 2 sets  and with adductionball squeeze/abduction blue band  10  reps .   HR was105 bpm and BP was 164/90 after exercise   Moist Heat Therapy   Number Minutes Moist Heat 15 Minutes   Moist Heat Location Lumbar Spine      BP on leaving 165/80 and BP 104 BPM            PT Short Term Goals - 10/26/15 1319    PT SHORT TERM GOAL #1   Title She will report able to stand for 5 -8 min before need to sit due to pain.    Status On-going   PT SHORT TERM GOAL #2   Title She will be able to walk 150 feet before need to stop due to pain   Status On-going   PT SHORT TERM GOAL #3   Title She will be able to demo inital HEP   Status On-going   PT SHORT TERM GOAL #4   Title She will be able to walk in and out of clinic  , no need of wheel chair or walker   Status On-going           PT Long Term Goals - 10/12/15 1315    PT LONG TERM GOAL #1   Title She will be able to perform all hEP as of last visit   Time 6   Period Weeks   Status New   PT LONG TERM GOAL #2   Title She will report pain decrased so  she is able to be on feet 10-15 min before need  to sit   Time 6   Period Weeks   Status New   PT LONG TERM GOAL #3   Title She will be able to walk 200 feet or more without need to sit due to pain.    Time 6   Period Weeks   Status New               Plan - 10/31/15 1321    Clinical Impression Statement She reports increased shoulder pain for no reason yesterday., LBP is still improve to level she spoke of returning to Silver sneakers at [facility] and she could use the nustep if she returns. This would help overall conditining. IF pain same in back next 3 session will look to discharge   PT Next Visit Plan HEP for core strength and stretch , cont HMP   Consulted and Agree with Plan of Care Patient        Problem List Patient Active Problem List   Diagnosis Date Noted  . Cellulitis of abdominal wall 03/21/2015  . Acute on chronic renal failure ([place]) 03/21/2015  . Type 2 diabetes mellitus ([place]) 03/21/2015  . Abdominal wall abscess 03/21/2015  . Failed total hip arthroplasty ([place]) 08/11/2014  . Low urine output 08/11/2014  . Chronic systolic CHF (congestive heart failure) ([place]) 08/11/2014  . Asthma, chronic 08/11/2014  . Hypothyroidism 08/11/2014  . OSA (obstructive sleep apnea) 08/11/2014  . Positive cardiac stress test 06/29/2014  . Hyperlipidemia 02/12/2014  . Left arm weakness 12/23/2013  . TIA (transient ischemic attack) 12/23/2013  . Left buttock abscess 10/12/2013  . Cellulitis and abscess of leg, right 04/16/2013  . Acute blood loss anemia 02/14/2013  . HTN (hypertension) 02/14/2013  . Cellulitis and abscess of buttock 02/09/2013  . Morbid obesity ([place]) 02/03/2013  . ARF (acute renal failure) ([place]) 02/03/2013  . Diarrhea 02/03/2013  . Hyponatremia 02/03/2013  . Hypokalemia 02/03/2013  . Sepsis ([place]) 02/02/2013  . DM (diabetes mellitus) ([place]) 02/02/2013  . CKD (chronic kidney disease), stage III 02/02/2013  . Cellulitis 02/02/2013  . PAD (peripheral artery  disease) ([place]) 04/26/2011  . Tobacco abuse 04/26/2011    Darrel Hoover PT 10/31/2015, 1:25 PM  [facility] [address] [place], [place], [address] Phone: [phone]   Fax:  [phone]  Name: [name] [name] MRN: [id number] Date of Birth: Jun 03, 1951

## 2015-11-02 ENCOUNTER — Ambulatory Visit: Payer: Commercial Managed Care - HMO

## 2015-11-02 VITALS — BP 192/100 | HR 118

## 2015-11-02 DIAGNOSIS — M5442 Lumbago with sciatica, left side: Secondary | ICD-10-CM

## 2015-11-02 DIAGNOSIS — R6889 Other general symptoms and signs: Secondary | ICD-10-CM

## 2015-11-02 DIAGNOSIS — R29898 Other symptoms and signs involving the musculoskeletal system: Secondary | ICD-10-CM

## 2015-11-02 DIAGNOSIS — M6281 Muscle weakness (generalized): Secondary | ICD-10-CM

## 2015-11-02 NOTE — Addendum Note (Signed)
Addended by: Darrel Hoover on: 11/02/2015 01:37 PM   Modules accepted: Orders

## 2015-11-02 NOTE — Therapy (Signed)
[place], [place], [address] Phone: [phone]   Fax:  [phone]  Physical Therapy Treatment  Patient Details  Name: [name] [name] MRN: [id number] Date of Birth: 03/11/1951 Referring Provider: Suella Broad, MD  Encounter Date: 11/02/2015      PT End of Session - 11/02/15 1255    Visit Number 7   Number of Visits 12   Date for PT Re-Evaluation 11/04/15   PT Start Time 1240   PT Stop Time 1312   PT Time Calculation (min) 32 min   Activity Tolerance Other (comment)  Limited by high BP   Behavior During Therapy [facility] for tasks assessed/performed      Past Medical History  Diagnosis Date  . History of hip replacement, total   . Hx MRSA infection     abscess left groin  . Arthritis   . GERD (gastroesophageal reflux disease)   . Gout   . Diabetes mellitus   . Hypertension   . Thyroid disease   . Hyperlipidemia   . TIA (transient ischemic attack)      X2 NO RESIDUAL PROBLEMS  . Positive cardiac stress test 12/2013    anterior and lateral ischemia on Myoview  . Hx of cardiac cath 06/2014  . Asthma   . Headache   . Loosening of prosthetic hip   . Cramping of feet   . Stress incontinence   . History of transfusion   . Sleep apnea     UNABLE TO TOLERATE C PAP  . Chronic kidney disease     Past Surgical History  Procedure Laterality Date  . Thyroidectomy    . Abdominal hysterectomy    . Laparoscopic cholecystectomy    . Forearm fracture surgery      Left arm  . Hernia repair      w/ mesh  . Incision and drainage abscess Left 02/04/2013    Procedure: INCISION AND DRAINAGE LEFT BUTTOCK ABSCESS; INCISION AND DRAINAGE LEFT BREAST ABSCESS;  Surgeon: [name] Bowie, MD;  Location: [place];  Service: General;  Laterality: Left;  . Irrigation and debridement abscess N/A 02/06/2013    Procedure: IRRIGATION AND DEBRIDEMENT BUTTOCK ABSCESS AND DRESSING CHANGE;  Surgeon: [name] Bowie, MD;   Location: [place];  Service: General;  Laterality: N/A;  . Irrigation and debridement abscess Left 02/08/2013    Procedure: IRRIGATION AND DEBRIDEMENT ABSCESS/DRESSING CHANGE;  Surgeon: [name] Ober, MD;  Location: [place];  Service: General;  Laterality: Left;  . Incision and drainage perirectal abscess Left 02/10/2013    Procedure: IRRIGATION AND DEBRIDEMENT OF BUTTOCK/PERINEAL ABSCESS;  Surgeon: [name] Burn. Georgette Dover, MD;  Location: [place];  Service: General;  Laterality: Left;  . Incision and drainage abscess N/A 02/12/2013    Procedure: INCISION AND DEBRIDEMENT BUTTOCK WOUND ;  Surgeon: [name] Burn. Georgette Dover, MD;  Location: [place];  Service: General;  Laterality: N/A;  . Incision and drainage abscess N/A 02/14/2013    Procedure: INCISION AND DRAINAGE/DRESSING CHANGE;  Surgeon: [name] Bowie, MD;  Location: [place];  Service: General;  Laterality: N/A;  . Incision and drainage perirectal abscess N/A 02/16/2013    Procedure: IRRIGATION AND DEBRIDEMENT PERINEAL ABSCESS;  Surgeon: [name] Jarred, MD;  Location: [place];  Service: General;  Laterality: N/A;  . Joint replacement  2010 / 2012   . Colon surgery  1995    DUE TO POLYP  . Total hip revision Right 08/11/2014    Procedure: RIGHT ACETABULAR  REVISION;  Surgeon: [name] Alf, MD;  [facility];  Service: Orthopedics;  Laterality: Right;  . Left heart catheterization with coronary angiogram N/A 07/02/2014    Procedure: LEFT HEART CATHETERIZATION WITH CORONARY ANGIOGRAM;  Surgeon: [name] Harp, MD;  Location: [facility] CATH LAB;  Service: Cardiovascular;  Laterality: N/A;  . Incision and drainage abscess N/A 03/21/2015    Procedure: INCISION AND DRAINAGE PUBIC ABSCESS;  Surgeon: [name] Seltzer, MD;  [facility];  Service: General;  Laterality: N/A;    Filed Vitals:   11/02/15 1242  BP: 192/100  Pulse: 118    Visit Diagnosis:  Bilateral low back pain with left-sided sciatica  Activity intolerance  Weakness of both hips  Weakness of trunk  musculature      Subjective Assessment - 11/02/15 1248    Subjective Had to rush to get here so BP up (after 5 min rest HR 96  BP  178/90   Currently in Pain? Yes   Pain Score 2    Pain Location Back   Pain Orientation Posterior;Lower   Pain Descriptors / Indicators Aching   Pain Type Chronic pain                         [facility] Adult PT Treatment/Exercise - 11/02/15 1251    Lumbar Exercises: Aerobic   Stationary Bike Nustep legs only L6  5 min  BP 200/100 post HR 118  and O2 sat 87    BP and H, O2 monitored from time she arrived in clinic until she left.               PT Short Term Goals - 11/02/15 1321    PT SHORT TERM GOAL #1   Title She will report able to stand for 5 -8 min before need to sit due to pain.    Status On-going   PT SHORT TERM GOAL #2   Title She will be able to walk 150 feet before need to stop due to pain   Status On-going   PT SHORT TERM GOAL #3   Title She will be able to demo inital HEP   Status Achieved  did this last visit   PT SHORT TERM GOAL #4   Title She will be able to walk in and out of clinic  , no need of wheel chair or walker   Baseline No need for wheel chair   Status Partially Met           PT Long Term Goals - 10/12/15 1315    PT LONG TERM GOAL #1   Title She will be able to perform all hEP as of last visit   Time 6   Period Weeks   Status New   PT LONG TERM GOAL #2   Title She will report pain decrased so she is able to be on feet 10-15 min before need to sit   Time 6   Period Weeks   Status New   PT LONG TERM GOAL #3   Title She will be able to walk 200 feet or more without need to sit due to pain.    Time 6   Period Weeks   Status New               Plan - 11/02/15 1259    Clinical Impression Statement [name] [name] BP contniues to be significantly elevated. Her pain is much improved in lower back . Her  BP did only came down to 185/100 after 5 min  rest post nustep so I did not feel  comfortable moving to exercise. Her O2 sat was 86-87 while resting and this varied some but stayed below 90 , also influenciing my choice not to continue the treatment.     PT Next Visit Plan HEP for core strength and stretch , cont HMP, REduce Nustep to L4 again    PT Home Exercise Plan cont HEP , add LTR    Consulted and Agree with Plan of Care Patient        Problem List Patient Active Problem List   Diagnosis Date Noted  . Cellulitis of abdominal wall 03/21/2015  . Acute on chronic renal failure ([place]) 03/21/2015  . Type 2 diabetes mellitus ([place]) 03/21/2015  . Abdominal wall abscess 03/21/2015  . Failed total hip arthroplasty ([place]) 08/11/2014  . Low urine output 08/11/2014  . Chronic systolic CHF (congestive heart failure) ([place]) 08/11/2014  . Asthma, chronic 08/11/2014  . Hypothyroidism 08/11/2014  . OSA (obstructive sleep apnea) 08/11/2014  . Positive cardiac stress test 06/29/2014  . Hyperlipidemia 02/12/2014  . Left arm weakness 12/23/2013  . TIA (transient ischemic attack) 12/23/2013  . Left buttock abscess 10/12/2013  . Cellulitis and abscess of leg, right 04/16/2013  . Acute blood loss anemia 02/14/2013  . HTN (hypertension) 02/14/2013  . Cellulitis and abscess of buttock 02/09/2013  . Morbid obesity ([place]) 02/03/2013  . ARF (acute renal failure) ([place]) 02/03/2013  . Diarrhea 02/03/2013  . Hyponatremia 02/03/2013  . Hypokalemia 02/03/2013  . Sepsis ([place]) 02/02/2013  . DM (diabetes mellitus) ([place]) 02/02/2013  . CKD (chronic kidney disease), stage III 02/02/2013  . Cellulitis 02/02/2013  . PAD (peripheral artery disease) ([place]) 04/26/2011  . Tobacco abuse 04/26/2011    Darrel Hoover PT 11/02/2015, 1:23 PM  [facility] [address] [place], [place], [address] Phone: [phone]   Fax:  [phone]  Name: [name] [name] MRN: [id number] Date of Birth: 09-May-1951

## 2015-11-04 ENCOUNTER — Ambulatory Visit: Payer: Commercial Managed Care - HMO

## 2015-11-04 VITALS — BP 170/88 | HR 114

## 2015-11-04 DIAGNOSIS — R29898 Other symptoms and signs involving the musculoskeletal system: Secondary | ICD-10-CM

## 2015-11-04 DIAGNOSIS — R6889 Other general symptoms and signs: Secondary | ICD-10-CM

## 2015-11-04 DIAGNOSIS — R293 Abnormal posture: Secondary | ICD-10-CM

## 2015-11-04 DIAGNOSIS — M6281 Muscle weakness (generalized): Secondary | ICD-10-CM

## 2015-11-04 DIAGNOSIS — M5442 Lumbago with sciatica, left side: Secondary | ICD-10-CM

## 2015-11-04 NOTE — Therapy (Signed)
[place], [place], [address] Phone: [phone]   Fax:  [phone]  Physical Therapy Treatment  Patient Details  Name: [name] [name] MRN: [id number] Date of Birth: 12/29/1950 Referring Provider: Suella Broad, MD  Encounter Date: 11/04/2015      PT End of Session - 11/04/15 1031    Visit Number 8   Number of Visits 12   Date for PT Re-Evaluation 11/25/15   PT Start Time 1020   PT Stop Time 1110   PT Time Calculation (min) 50 min   Activity Tolerance Patient limited by pain   Behavior During Therapy [facility] for tasks assessed/performed      Past Medical History  Diagnosis Date  . History of hip replacement, total   . Hx MRSA infection     abscess left groin  . Arthritis   . GERD (gastroesophageal reflux disease)   . Gout   . Diabetes mellitus   . Hypertension   . Thyroid disease   . Hyperlipidemia   . TIA (transient ischemic attack)      X2 NO RESIDUAL PROBLEMS  . Positive cardiac stress test 12/2013    anterior and lateral ischemia on Myoview  . Hx of cardiac cath 06/2014  . Asthma   . Headache   . Loosening of prosthetic hip   . Cramping of feet   . Stress incontinence   . History of transfusion   . Sleep apnea     UNABLE TO TOLERATE C PAP  . Chronic kidney disease     Past Surgical History  Procedure Laterality Date  . Thyroidectomy    . Abdominal hysterectomy    . Laparoscopic cholecystectomy    . Forearm fracture surgery      Left arm  . Hernia repair      w/ mesh  . Incision and drainage abscess Left 02/04/2013    Procedure: INCISION AND DRAINAGE LEFT BUTTOCK ABSCESS; INCISION AND DRAINAGE LEFT BREAST ABSCESS;  Surgeon: Harl Bowie, MD;  Location: [place];  Service: General;  Laterality: Left;  . Irrigation and debridement abscess N/A 02/06/2013    Procedure: IRRIGATION AND DEBRIDEMENT BUTTOCK ABSCESS AND DRESSING CHANGE;  Surgeon: Harl Bowie, MD;  Location: [place];  Service: General;  Laterality: N/A;  . Irrigation and debridement abscess Left 02/08/2013    Procedure: IRRIGATION AND DEBRIDEMENT ABSCESS/DRESSING CHANGE;  Surgeon: Gwenyth Ober, MD;  Location: [place];  Service: General;  Laterality: Left;  . Incision and drainage perirectal abscess Left 02/10/2013    Procedure: IRRIGATION AND DEBRIDEMENT OF BUTTOCK/PERINEAL ABSCESS;  Surgeon: [name]. Georgette Dover, MD;  Location: [place];  Service: General;  Laterality: Left;  . Incision and drainage abscess N/A 02/12/2013    Procedure: INCISION AND DEBRIDEMENT BUTTOCK WOUND ;  Surgeon: [name]. Georgette Dover, MD;  Location: [place];  Service: General;  Laterality: N/A;  . Incision and drainage abscess N/A 02/14/2013    Procedure: INCISION AND DRAINAGE/DRESSING CHANGE;  Surgeon: Harl Bowie, MD;  Location: [place];  Service: General;  Laterality: N/A;  . Incision and drainage perirectal abscess N/A 02/16/2013    Procedure: IRRIGATION AND DEBRIDEMENT PERINEAL ABSCESS;  Surgeon: Zenovia Jarred, MD;  Location: [place];  Service: General;  Laterality: N/A;  . Joint replacement  2010 / 2012   . Colon surgery  1995    DUE TO POLYP  . Total hip revision Right 08/11/2014    Procedure: RIGHT ACETABULAR REVISION;  Surgeon:  Gearlean Alf, MD;  [facility];  Service: Orthopedics;  Laterality: Right;  . Left heart catheterization with coronary angiogram N/A 07/02/2014    Procedure: LEFT HEART CATHETERIZATION WITH CORONARY ANGIOGRAM;  Surgeon: Lorretta Harp, MD;  Location: [facility] CATH LAB;  Service: Cardiovascular;  Laterality: N/A;  . Incision and drainage abscess N/A 03/21/2015    Procedure: INCISION AND DRAINAGE PUBIC ABSCESS;  Surgeon: Excell Seltzer, MD;  [facility];  Service: General;  Laterality: N/A;    Filed Vitals:   11/04/15 1022  BP: 170/88  Pulse: 114    Visit Diagnosis:  Bilateral low back pain with left-sided sciatica  Activity intolerance  Weakness of both hips  Weakness of trunk  musculature  Abnormal posture      Subjective Assessment - 11/04/15 1027    Subjective Vitals taken after walking back from lobby. LBP 1/10.   RT shoulder higher moderate to severe.    Currently in Pain? Yes   Pain Score 1    Pain Location Back   Multiple Pain Sites Yes  RT shoulder                         [facility] Adult PT Treatment/Exercise - 11/04/15 1033    Ambulation/Gait   Gait Comments After rest  prior to walk HR 96 , BP 150/90, O2 91.  Walked 100 feet withRW with LBP 2/10 max  and HR 118, BP 170/88, O2 9  then she rested 3-4 min and walked agoain 150 feet . Prior to HR  98  O2  92  BP 165/82  . Post walk  HR 118  O2 92 BP  165/90.         Lumbar Exercises: Supine   Heel Slides 10 reps   RT and LT     Bent Knee Raise 10 reps;1 second  RT and LT 2 sets   Other Supine Lumbar Exercises Ball squeeze adductionx 15 , single leg drop LT and RT x 12 and post pelvic tilt and abdominal tightening. x 15.      Nustep L4 5 min LE.  Much of treatment is spent monitoring her vital signs and monitoring her vitals and pain level while she rests.             PT Short Term Goals - 11/04/15 1029    PT SHORT TERM GOAL #1   Title She will report able to stand for 5 -8 min before need to sit due to pain.    Baseline 3-5 min   Status Partially Met   PT SHORT TERM GOAL #2   Title She will be able to walk 150 feet before need to stop due to pain   Baseline Pain was 4/10   Status Achieved   PT SHORT TERM GOAL #3   Title She will be able to demo inital HEP   Status Achieved   PT SHORT TERM GOAL #4   Title She will be able to walk in and out of clinic  , no need of wheel chair or walker   Baseline No need for wheel chair   Status Partially Met           PT Long Term Goals - 11/04/15 1031    PT LONG TERM GOAL #1   Title She will be able to perform all HEP as of last visit   Status On-going   PT LONG TERM GOAL #2   Title  She will report pain decreased so she is  able to be on feet 10-15 min before need to sit   Status On-going   PT LONG TERM GOAL #3   Title She will be able to walk 200 feet or more without need to sit due to pain.    Baseline 150n feet max with increased pain and respirations   Status On-going               Plan - 11/04/15 1205    Clinical Impression Statement Her LBP remains low now when she sits and after walks 62f100 feet or less but 150 feet  gave LBP 4/10 and increase respiration.  After mat exercise she reported back pain 5/10 after leg movements and stated her RT shoulder was high level of pain and this is affecting her life more than her back pain.  I advised her to go see her MD for check up to her back and to assess shoulder pain.  Her HR  and BP are high but more steady now. We will continue 2 more weeks for back treatment then discharge.    PT Next Visit Plan HEP for core strength and stretch , cont HMP, REduce Nustep to L4 again    Consulted and Agree with Plan of Care Patient        Problem List Patient Active Problem List   Diagnosis Date Noted  . Cellulitis of abdominal wall 03/21/2015  . Acute on chronic renal failure ([place] 03/21/2015  . Type 2 diabetes mellitus ([place] 03/21/2015  . Abdominal wall abscess 03/21/2015  . Failed total hip arthroplasty ([place] 08/11/2014  . Low urine output 08/11/2014  . Chronic systolic CHF (congestive heart failure) ([place] 08/11/2014  . Asthma, chronic 08/11/2014  . Hypothyroidism 08/11/2014  . OSA (obstructive sleep apnea) 08/11/2014  . Positive cardiac stress test 06/29/2014  . Hyperlipidemia 02/12/2014  . Left arm weakness 12/23/2013  . TIA (transient ischemic attack) 12/23/2013  . Left buttock abscess 10/12/2013  . Cellulitis and abscess of leg, right 04/16/2013  . Acute blood loss anemia 02/14/2013  . HTN (hypertension) 02/14/2013  . Cellulitis and abscess of buttock 02/09/2013  . Morbid obesity ([place] 02/03/2013  . ARF (acute renal failure) ([place] 02/03/2013  .  Diarrhea 02/03/2013  . Hyponatremia 02/03/2013  . Hypokalemia 02/03/2013  . Sepsis ([place] 02/02/2013  . DM (diabetes mellitus) ([place] 02/02/2013  . CKD (chronic kidney disease), stage III 02/02/2013  . Cellulitis 02/02/2013  . PAD (peripheral artery disease) ([place] 04/26/2011  . Tobacco abuse 04/26/2011    CDarrel HooverPT 11/04/2015, 12:11 PM  [facility] [place] [address]Phone: [phone]  Fax:  [phone] Name: [name] PEACHMRN: [id number] of Birth: [birth year]

## 2015-11-08 ENCOUNTER — Ambulatory Visit: Payer: Commercial Managed Care - HMO

## 2015-11-14 ENCOUNTER — Ambulatory Visit: Payer: Commercial Managed Care - HMO

## 2015-11-16 ENCOUNTER — Ambulatory Visit: Payer: Commercial Managed Care - HMO

## 2015-11-21 ENCOUNTER — Ambulatory Visit: Payer: Commercial Managed Care - HMO | Attending: Physical Medicine and Rehabilitation

## 2015-11-21 VITALS — BP 175/80 | HR 104

## 2015-11-21 DIAGNOSIS — R6889 Other general symptoms and signs: Secondary | ICD-10-CM

## 2015-11-21 DIAGNOSIS — R293 Abnormal posture: Secondary | ICD-10-CM | POA: Diagnosis present

## 2015-11-21 DIAGNOSIS — M5442 Lumbago with sciatica, left side: Secondary | ICD-10-CM

## 2015-11-21 DIAGNOSIS — M6289 Other specified disorders of muscle: Secondary | ICD-10-CM | POA: Diagnosis present

## 2015-11-21 DIAGNOSIS — M6281 Muscle weakness (generalized): Secondary | ICD-10-CM | POA: Diagnosis present

## 2015-11-21 DIAGNOSIS — R29898 Other symptoms and signs involving the musculoskeletal system: Secondary | ICD-10-CM

## 2015-11-21 NOTE — Therapy (Signed)
[place], [place], [address] Phone: [phone]   Fax:  [phone]  Physical Therapy Treatment  Patient Details  Name: [name] [name] MRN: [id number] Date of Birth: [birth year] Referring Provider: Suella Broad, MD  Encounter Date: 11/21/2015      PT End of Session - 11/21/15 1315    Visit Number 9   Number of Visits 12   PT Start Time 3716   PT Stop Time 1312   PT Time Calculation (min) 48 min   Activity Tolerance Patient tolerated treatment well   Behavior During Therapy [facility] for tasks assessed/performed      Past Medical History  Diagnosis Date  . History of hip replacement, total   . Hx MRSA infection     abscess left groin  . Arthritis   . GERD (gastroesophageal reflux disease)   . Gout   . Diabetes mellitus   . Hypertension   . Thyroid disease   . Hyperlipidemia   . TIA (transient ischemic attack)      X2 NO RESIDUAL PROBLEMS  . Positive cardiac stress test 12/2013    anterior and lateral ischemia on Myoview  . Hx of cardiac cath 06/2014  . Asthma   . Headache   . Loosening of prosthetic hip   . Cramping of feet   . Stress incontinence   . History of transfusion   . Sleep apnea     UNABLE TO TOLERATE C PAP  . Chronic kidney disease     Past Surgical History  Procedure Laterality Date  . Thyroidectomy    . Abdominal hysterectomy    . Laparoscopic cholecystectomy    . Forearm fracture surgery      Left arm  . Hernia repair      w/ mesh  . Incision and drainage abscess Left 02/04/2013    Procedure: INCISION AND DRAINAGE LEFT BUTTOCK ABSCESS; INCISION AND DRAINAGE LEFT BREAST ABSCESS;  Surgeon: Harl Bowie, MD;  Location: [place];  Service: General;  Laterality: Left;  . Irrigation and debridement abscess N/A 02/06/2013    Procedure: IRRIGATION AND DEBRIDEMENT BUTTOCK ABSCESS AND DRESSING CHANGE;  Surgeon: Harl Bowie, MD;  Location: [place];  Service: General;   Laterality: N/A;  . Irrigation and debridement abscess Left 02/08/2013    Procedure: IRRIGATION AND DEBRIDEMENT ABSCESS/DRESSING CHANGE;  Surgeon: Gwenyth Ober, MD;  Location: [place];  Service: General;  Laterality: Left;  . Incision and drainage perirectal abscess Left 02/10/2013    Procedure: IRRIGATION AND DEBRIDEMENT OF BUTTOCK/PERINEAL ABSCESS;  Surgeon: [name]. Georgette Dover, MD;  Location: [place];  Service: General;  Laterality: Left;  . Incision and drainage abscess N/A 02/12/2013    Procedure: INCISION AND DEBRIDEMENT BUTTOCK WOUND ;  Surgeon: [name]. Georgette Dover, MD;  Location: [place];  Service: General;  Laterality: N/A;  . Incision and drainage abscess N/A 02/14/2013    Procedure: INCISION AND DRAINAGE/DRESSING CHANGE;  Surgeon: Harl Bowie, MD;  Location: [place];  Service: General;  Laterality: N/A;  . Incision and drainage perirectal abscess N/A 02/16/2013    Procedure: IRRIGATION AND DEBRIDEMENT PERINEAL ABSCESS;  Surgeon: Zenovia Jarred, MD;  Location: [place];  Service: General;  Laterality: N/A;  . Joint replacement  2010 / 2012   . Colon surgery  1995    DUE TO POLYP  . Total hip revision Right 08/11/2014    Procedure: RIGHT ACETABULAR REVISION;  Surgeon: Gearlean Alf, MD;  Location: [facility];  Service: Orthopedics;  Laterality: Right;  . Left heart catheterization with coronary angiogram N/A 07/02/2014    Procedure: LEFT HEART CATHETERIZATION WITH CORONARY ANGIOGRAM;  Surgeon: Lorretta Harp, MD;  Location: [facility] CATH LAB;  Service: Cardiovascular;  Laterality: N/A;  . Incision and drainage abscess N/A 03/21/2015    Procedure: INCISION AND DRAINAGE PUBIC ABSCESS;  Surgeon: Excell Seltzer, MD;  [facility];  Service: General;  Laterality: N/A;    Filed Vitals:   11/21/15 1224  BP: 175/80  Pulse: 104    Visit Diagnosis:  Bilateral low back pain with left-sided sciatica  Activity intolerance  Weakness of both hips  Weakness of trunk musculature  Abnormal posture       Subjective Assessment - 11/21/15 1224    Subjective I was told by MD to rest arm for a while and hold PT after injection to RT shoulder to calm pain. She reports injection eased pain but he only used 1/2 dose due to concerns about diabetes.  She reported shs does her HEP daily when sitting and lying.    Pain Score 2    Pain Location Back   Pain Orientation Posterior;Lower   Pain Descriptors / Indicators Aching   Pain Type Chronic pain   Pain Onset More than a month ago   Pain Frequency Constant   Aggravating Factors  stand and walk   Pain Relieving Factors sitting   Multiple Pain Sites Yes  RT shoulder 3/10            [facility] PT Assessment - 11/21/15 1219    AROM   Lumbar Flexion She is able to touch her toes   Lumbar Extension Unable to come to erect position   Lumbar - Right Side Bend 25   Lumbar - Left Side Bend 25                     [facility] Adult PT Treatment/Exercise - 11/21/15 1229    Ambulation/Gait   Pre-Gait Activities We timed standing tolerance. and she was able to stand for 1 min 42 sec and sat due to back paion at 4/10 level and stood with limited weight to LT leg.    Gait Comments Prior to walk HR 104 bpm, BP 135/80,   walked 140 feet and stopped due to tightness in LT posterior thigh and calf  and this eased off after sitting for 2-3 min.  Then walked after BP decr to  160/78 she then walked 140 feet and needed to stop for same reason of tighness into LT leg than eased after 2-3 min sitting. Her BP at end of walk was 180/84  HR 108  BPM   Her LBP was no more than 2.5 /10                 PT Education - 11/21/15 1249    Education provided Yes   Education Details Discussed discharge due to lack of progress with limiting factors of radicular symptoms, incr BP, RT shoulder pain. all a factor in limiting time in feet and walking.           PT Short Term Goals - 11/21/15 1253    PT SHORT TERM GOAL #1   Title She will report able to stand for 5  -8 min before need to sit due to pain.    Status Not Met   PT SHORT TERM GOAL #2   Title She will be able to walk 150  feet before need to stop due to pain   PT SHORT TERM GOAL #3   Title She will be able to demo inital HEP   Status Deferred   PT SHORT TERM GOAL #4   Title She will be able to walk in and out of clinic  , no need of wheel chair or walker   Status Not Met           PT Long Term Goals - 2015/12/03 1254    PT LONG TERM GOAL #1   Title She will be able to perform all HEP as of last visit   Status Achieved   PT LONG TERM GOAL #2   Title She will report pain decreased so she is able to be on feet 10-15 min before need to sit   Baseline 28mn 45 sec   Status Not Met   PT LONG TERM GOAL #3   Title She will be able to walk 200 feet or more without need to sit due to pain.    Status Not Met               Plan - [id number]    Clinical Impression Statement Ms PBentlyis no worse today after being off PT for a week. She has deccr shoulder pain post injection and   back pain limits her less per her report tolerating walking and standing longer though still limited She stands on RT leg due to pain /tightness in LT leg.  She has maxed PT benefit and she was encouraged to continue HEP daily and to see MD for injection when appropriate.   If injections make a significant benefit and the doctors feel she could do a more advanced HEP, return to PT may be appropriate.    PT Next Visit Plan discharge today   Consulted and Agree with Plan of Care Patient          G-Codes - [id number]    Functional Assessment Tool Used FOTO 63% limited   Functional Limitation Other PT primary   Other PT Primary Goal Status ((Q6761 At least 40 percent but less than 60 percent impaired, limited or restricted   Other PT Primary Discharge Status ((P5093 At least 60 percent but less than 80 percent impaired, limited or restricted      Problem List Patient Active Problem List   Diagnosis  Date Noted  . Cellulitis of abdominal wall 03/21/2015  . Acute on chronic renal failure ([facility] 03/21/2015  . Type 2 diabetes mellitus ([facility] 03/21/2015  . Abdominal wall abscess 03/21/2015  . Failed total hip arthroplasty ([facility] 08/11/2014  . Low urine output 08/11/2014  . Chronic systolic CHF (congestive heart failure) ([facility] 08/11/2014  . Asthma, chronic 08/11/2014  . Hypothyroidism 08/11/2014  . OSA (obstructive sleep apnea) 08/11/2014  . Positive cardiac stress test 06/29/2014  . Hyperlipidemia 02/12/2014  . Left arm weakness 12/23/2013  . TIA (transient ischemic attack) 12/23/2013  . Left buttock abscess 10/12/2013  . Cellulitis and abscess of leg, right 04/16/2013  . Acute blood loss anemia 02/14/2013  . HTN (hypertension) 02/14/2013  . Cellulitis and abscess of buttock 02/09/2013  . Morbid obesity ([facility] 02/03/2013  . ARF (acute renal failure) ([facility] 02/03/2013  . Diarrhea 02/03/2013  . Hyponatremia 02/03/2013  . Hypokalemia 02/03/2013  . Sepsis ([facility] 02/02/2013  . DM (diabetes mellitus) ([facility] 02/02/2013  . CKD (chronic kidney disease), stage III 02/02/2013  . Cellulitis 02/02/2013  . PAD (peripheral artery disease) ([facility] 04/26/2011  .  Tobacco abuse 04/26/2011    Darrel Hoover PT 11/21/2015, 1:26 PM  [facility] [address] [place], [place], [address] Phone: [phone]   Fax:  [phone]  Name: [name] [name] MRN: [id number] Date of Birth: 1951/06/20   PHYSICAL THERAPY DISCHARGE SUMMARY  Visits from Start of Care: 9  Current functional level related to goals / functional outcomes: See Above   Remaining deficits: Continues limited in standing and wlaking due to LBP, radicular symptoms, shoulder pain, high BP   Education / Equipment: HEP Plan: Patient agrees to discharge.  Patient goals were partially met. Patient is being discharged due to                                                     ?????    Maximizing rehab potential

## 2016-02-14 ENCOUNTER — Other Ambulatory Visit: Payer: Self-pay | Admitting: Vascular Surgery

## 2016-02-14 ENCOUNTER — Encounter: Payer: Self-pay | Admitting: Vascular Surgery

## 2016-02-14 DIAGNOSIS — I739 Peripheral vascular disease, unspecified: Secondary | ICD-10-CM

## 2016-02-29 ENCOUNTER — Encounter: Payer: Self-pay | Admitting: Vascular Surgery

## 2016-03-08 ENCOUNTER — Encounter: Payer: Self-pay | Admitting: Vascular Surgery

## 2016-03-08 ENCOUNTER — Ambulatory Visit (INDEPENDENT_AMBULATORY_CARE_PROVIDER_SITE_OTHER): Payer: Medicare Other | Admitting: Vascular Surgery

## 2016-03-08 ENCOUNTER — Other Ambulatory Visit: Payer: Self-pay

## 2016-03-08 ENCOUNTER — Ambulatory Visit (HOSPITAL_COMMUNITY)
Admission: RE | Admit: 2016-03-08 | Discharge: 2016-03-08 | Disposition: A | Discharge status: Home / Self Care | Payer: Medicare Other | Source: Ambulatory Visit | Attending: Vascular Surgery | Admitting: Vascular Surgery

## 2016-03-08 VITALS — BP 178/92 | HR 102 | Ht 64.5 in | Wt 310.0 lb

## 2016-03-08 DIAGNOSIS — I739 Peripheral vascular disease, unspecified: Secondary | ICD-10-CM | POA: Insufficient documentation

## 2016-03-08 DIAGNOSIS — N189 Chronic kidney disease, unspecified: Secondary | ICD-10-CM | POA: Insufficient documentation

## 2016-03-08 DIAGNOSIS — K219 Gastro-esophageal reflux disease without esophagitis: Secondary | ICD-10-CM | POA: Diagnosis not present

## 2016-03-08 DIAGNOSIS — F17210 Nicotine dependence, cigarettes, uncomplicated: Secondary | ICD-10-CM | POA: Diagnosis not present

## 2016-03-08 DIAGNOSIS — R938 Abnormal findings on diagnostic imaging of other specified body structures: Secondary | ICD-10-CM | POA: Insufficient documentation

## 2016-03-08 DIAGNOSIS — I129 Hypertensive chronic kidney disease with stage 1 through stage 4 chronic kidney disease, or unspecified chronic kidney disease: Secondary | ICD-10-CM | POA: Diagnosis not present

## 2016-03-08 DIAGNOSIS — E785 Hyperlipidemia, unspecified: Secondary | ICD-10-CM | POA: Diagnosis not present

## 2016-03-08 DIAGNOSIS — E1122 Type 2 diabetes mellitus with diabetic chronic kidney disease: Secondary | ICD-10-CM | POA: Diagnosis not present

## 2016-03-08 NOTE — Progress Notes (Signed)
Referring Physician: [name], MD  Patient name: [name] [name] MRN: [id number] DOB: January 28, 1951 Sex: female  REASON FOR CONSULT: Right foot pain  HPI: [name] [name] is a 65 y.o. female that we have seen intermittently over the last several years for peripheral arterial disease. She was last seen about 5 years ago. At that time she had bilateral ABIs in the 0.5 range and had some mild claudication symptoms. Over the last few years she has progressed significantly. She now has pain in her right foot intermittently consistent with rest pain. She also has very short claudication distance right leg worse than the left. Unfortunately despite several smoking cessation counseling session over the last few years she continues to smoke. Greater than 3 minutes were again spent regarding smoking cessation counseling. Other medical problems include diabetes, previous MRSA infection left groin, hypertension, hyperlipidemia, obesity, asthma, sleep apnea. These are all currently stable. She is on Plavix. She is followed by Dr. Florene Glen for some element of renal dysfunction.  Past Medical History  Diagnosis Date  . History of hip replacement, total   . Hx MRSA infection     abscess left groin  . Arthritis   . GERD (gastroesophageal reflux disease)   . Gout   . Diabetes mellitus   . Hypertension   . Thyroid disease   . Hyperlipidemia   . TIA (transient ischemic attack)      X2 NO RESIDUAL PROBLEMS  . Positive cardiac stress test 12/2013    anterior and lateral ischemia on Myoview  . Hx of cardiac cath 06/2014  . Asthma   . Headache   . 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[place])   . Cramping of feet   . Stress incontinence   . History of transfusion   . Sleep apnea     UNABLE TO TOLERATE C PAP  . Chronic kidney disease   . Chronic kidney disease   . Anemia   . Bronchitis   . Chronic pain syndrome   . Morbidly obese ([place])   . Hypothyroid   . Peripheral vascular disease [facility])    Past  Surgical History  Procedure Laterality Date  . Thyroidectomy    . Abdominal hysterectomy    . Laparoscopic cholecystectomy    . Forearm fracture surgery      Left arm  . Hernia repair      w/ mesh  . Incision and drainage abscess Left 02/04/2013    Procedure: INCISION AND DRAINAGE LEFT BUTTOCK ABSCESS; INCISION AND DRAINAGE LEFT BREAST ABSCESS;  Surgeon: Harl Bowie, MD;  Location: [place];  Service: General;  Laterality: Left;  . Irrigation and debridement abscess N/A 02/06/2013    Procedure: IRRIGATION AND DEBRIDEMENT BUTTOCK ABSCESS AND DRESSING CHANGE;  Surgeon: Harl Bowie, MD;  Location: [place];  Service: General;  Laterality: N/A;  . Irrigation and debridement abscess Left 02/08/2013    Procedure: IRRIGATION AND DEBRIDEMENT ABSCESS/DRESSING CHANGE;  Surgeon: Gwenyth Ober, MD;  Location: [place];  Service: General;  Laterality: Left;  . Incision and drainage perirectal abscess Left 02/10/2013    Procedure: IRRIGATION AND DEBRIDEMENT OF BUTTOCK/PERINEAL ABSCESS;  Surgeon: [name]. Georgette Dover, MD;  Location: [place];  Service: General;  Laterality: Left;  . Incision and drainage abscess N/A 02/12/2013    Procedure: INCISION AND DEBRIDEMENT BUTTOCK WOUND ;  Surgeon: [name]. Georgette Dover, MD;  Location: [place];  Service: General;  Laterality: N/A;  . Incision and drainage abscess N/A 02/14/2013  Procedure: INCISION AND DRAINAGE/DRESSING CHANGE;  Surgeon: Harl Bowie, MD;  Location: [place];  Service: General;  Laterality: N/A;  . Incision and drainage perirectal abscess N/A 02/16/2013    Procedure: IRRIGATION AND DEBRIDEMENT PERINEAL ABSCESS;  Surgeon: Zenovia Jarred, MD;  Location: [place];  Service: General;  Laterality: N/A;  . Joint replacement  2010 / 2012   . Colon surgery  1995    DUE TO POLYP  . Total hip revision Right 08/11/2014    Procedure: RIGHT ACETABULAR REVISION;  Surgeon: Gearlean Alf, MD;  [facility];  Service: Orthopedics;  Laterality: Right;  . Left heart  catheterization with coronary angiogram N/A 07/02/2014    Procedure: LEFT HEART CATHETERIZATION WITH CORONARY ANGIOGRAM;  Surgeon: Lorretta Harp, MD;  Location: [facility] CATH LAB;  Service: Cardiovascular;  Laterality: N/A;  . Incision and drainage abscess N/A 03/21/2015    Procedure: INCISION AND DRAINAGE PUBIC ABSCESS;  Surgeon: Excell Seltzer, MD;  [facility];  Service: General;  Laterality: N/A;    Family History  Problem Relation Age of Onset  . Diabetes Brother   . Cardiomyopathy Mother   . Heart disease Mother   . Cancer Father     SOCIAL HISTORY: Social History   Social History  . Marital Status: Married    Spouse Name: N/A  . Number of Children: 1  . Years of Education: college   Occupational History  .      Disabled   Social History Main Topics  . Smoking status: Current Every Day Smoker -- 0.50 packs/day for 40 years    Types: Cigarettes  . Smokeless tobacco: Never Used  . Alcohol Use: 0.6 oz/week    1 Standard drinks or equivalent per week     Comment: OCC  . Drug Use: No     Comment: Quit four years ago.  Marland Kitchen Sexual Activity: Not on file   Other Topics Concern  . Not on file   Social History Narrative   Patient is se prated  and lives at home with her friend. Patient is disabled.   Education one year of college   Right handed   Caffeine two cups daily.          Allergies  Allergen Reactions  . Ace Inhibitors     Tongue swell  . Bystolic [Nebivolol Hcl] Swelling and Other (See Comments)    Chest pain   . Morphine And Related Itching    Current Outpatient Prescriptions  Medication Sig Dispense Refill  . acetaminophen (TYLENOL) 325 MG tablet Take 2 tablets (650 mg total) by mouth every 6 (six) hours as needed for mild pain (or Fever >/= 101). 30 tablet 0  . albuterol (PROVENTIL) (2.5 MG/3ML) 0.083% nebulizer solution Take 2.5 mg by nebulization every 6 (six) hours as needed for wheezing or shortness of breath.    . allopurinol (ZYLOPRIM) 100  MG tablet Take 100 mg by mouth every morning.     . B Complex Vitamins (VITAMIN B COMPLEX PO) Take 1 tablet by mouth 2 (two) times daily.     . budesonide-formoterol (SYMBICORT) 160-4.5 MCG/ACT inhaler Inhale 2 puffs into the lungs 2 (two) times daily.    . cetirizine (ZYRTEC) 10 MG tablet Take 1 tablet by mouth daily as needed. allergies    . clopidogrel (PLAVIX) 75 MG tablet TAKE 1 TABLET EVERY DAY 90 tablet 1  . diclofenac sodium (VOLTAREN) 1 % GEL Apply 4 g topically 4 (four) times daily.    Marland Kitchen  Dulaglutide (TRULICITY) 7.85 YI/5.0YD SOPN Inject 0.75 mg into the skin.    Marland Kitchen esomeprazole (NEXIUM) 40 MG capsule Take 40 mg by mouth daily at 12 noon.    . fenofibrate (TRICOR) 145 MG tablet TAKE 1 TABLET (145 MG TOTAL) BY MOUTH DAILY. 90 tablet 2  . fluticasone (FLONASE) 50 MCG/ACT nasal spray Place 2 sprays into the nose as needed for allergies. Reported on 02/14/2016    . furosemide (LASIX) 40 MG tablet Take 40 mg by mouth daily.    . Garcinia Cambogia-Chromium 500-200 MG-MCG TABS Take 1,600 mg by mouth daily.    . insulin aspart protamine- aspart (NOVOLOG MIX 70/30) (70-30) 100 UNIT/ML injection Inject 30-40 Units into the skin 2 (two) times daily with a meal. Take 40 units in the AM and 30 units at Night    . levothyroxine (SYNTHROID, LEVOTHROID) 137 MCG tablet Take 150 mcg by mouth daily before breakfast.     . losartan (COZAAR) 100 MG tablet Take 1 tablet (100 mg total) by mouth every morning. 90 tablet 3  . mirabegron ER (MYRBETRIQ) 50 MG TB24 tablet Take 50 mg by mouth daily.    . naftifine (NAFTIN) 1 % cream APPLY TO THE AFFECTED AREA ON SKIN EVERY DAY 60 g 0  . nortriptyline (PAMELOR) 10 MG capsule Take 10 mg by mouth at bedtime.    . ONE TOUCH ULTRA TEST test strip 1 each by Other route 3 (three) times daily as needed (testing blood sugar.).     Marland Kitchen oxyCODONE-acetaminophen (PERCOCET/ROXICET) 5-325 MG per tablet Take 1 tablet by mouth every 6 (six) hours as needed for moderate pain or severe  pain.   0  . polyvinyl alcohol (LIQUIFILM TEARS) 1.4 % ophthalmic solution Place 2 drops into both eyes daily as needed for dry eyes.    . potassium chloride SA (K-DUR,KLOR-CON) 20 MEQ tablet Take 20 mEq by mouth 2 (two) times daily.    Marland Kitchen tiZANidine (ZANAFLEX) 4 MG capsule Take 4 mg by mouth 3 (three) times daily.    . Azilsartan Medoxomil (EDARBI) 40 MG TABS Take 40 mg by mouth daily. Reported on 03/08/2016    . BYSTOLIC 10 MG tablet Take 10 mg by mouth daily. Reported on 03/08/2016  2  . doxycycline (ADOXA) 100 MG tablet Take 1 tablet (100 mg total) by mouth 2 (two) times daily. (Patient not taking: Reported on 10/12/2015) 24 tablet 0  . VESICARE 10 MG tablet Take 10 mg by mouth every morning. Reported on 03/08/2016     No current facility-administered medications for this visit.    ROS:   General:  No weight loss, Fever, chills  HEENT: No recent headaches, no nasal bleeding, no visual changes, no sore throat  Neurologic: No dizziness, blackouts, seizures. No recent symptoms of stroke or mini- stroke. No recent episodes of slurred speech, or temporary blindness.  Cardiac: No recent episodes of chest pain/pressure, no shortness of breath at rest.  No shortness of breath with exertion.  Denies history of atrial fibrillation or irregular heartbeat  Vascular: No history of rest pain in feet.  + history of claudication.  No history of non-healing ulcer, No history of DVT   Pulmonary: No home oxygen, no productive cough, no hemoptysis,  + asthma or wheezing  Musculoskeletal:  '[ ]'$  Arthritis, '[ ]'$  Low back pain,  '[ ]'$  Joint pain  Hematologic:No history of hypercoagulable state.  No history of easy bleeding.  No history of anemia  Gastrointestinal: No hematochezia or melena,  No gastroesophageal reflux, no trouble swallowing  Urinary: [x ] chronic Kidney disease, '[ ]'$  on HD - '[ ]'$  MWF or '[ ]'$  TTHS, '[ ]'$  Burning with urination, '[ ]'$  Frequent urination, '[ ]'$  Difficulty urinating;   Skin: No  rashes  Psychological: No history of anxiety,  No history of depression   Physical Examination  Filed Vitals:   03/08/16 1013 03/08/16 1016  BP: 172/90 178/92  Pulse: 102   Height: 5' 4.5" (1.638 m)   Weight: 310 lb (140.615 kg)   SpO2: 95%     Body mass index is 52.41 kg/(m^2).  General:  Alert and oriented, no acute distress HEENT: Normal Neck: No bruit or JVD Pulmonary: Clear to auscultation bilaterally Cardiac: Regular Rate and Rhythm without murmur Abdomen: Soft, non-tender, non-distended, no mass, no scars Skin: No rash Extremity Pulses:  2+ radial, brachial, femoral, Absent dorsalis pedis, posterior tibial pulses bilaterally Musculoskeletal: No deformity trace right ankle edema dependent rubor right foot no ulcers  Neurologic: Upper and lower extremity motor 5/5 and symmetric  DATA:  Jeanette bilateral ABIs were 0.3 on the right 0.57 on the left monophasic waveforms bilaterally this is a significant decline of about 50% on both sides from January 2013.  ASSESSMENT:  Worsening peripheral arterial disease right foot now threatened   PLAN:  Aortogram bilateral lower extremity runoff possible intervention scheduled for June 9.  Risks benefits possible palpitations and procedure details were discussed the patient today including but not limited to bleeding infection and much higher risk of this due to her obesity. Also possible contrast reaction or vessel injury. We will need to check her creatinine preprocedure to make sure that it is reasonable prior to giving her contrast load. The patient would not be a candidate for open revascularization due to her severe obesity and high risk of wound complications. Hopefully a percutaneous option will exist for her. It was again strongly emphasized to the patient today to quit smoking.   [name], MD [facility] Office: [phone] Pager: [phone]

## 2016-03-15 ENCOUNTER — Ambulatory Visit: Payer: Commercial Managed Care - HMO | Admitting: Vascular Surgery

## 2016-03-16 ENCOUNTER — Other Ambulatory Visit: Payer: Self-pay | Admitting: *Deleted

## 2016-03-16 ENCOUNTER — Encounter (HOSPITAL_COMMUNITY)
Admission: RE | Disposition: A | Discharge status: Home / Self Care | Payer: Self-pay | Source: Ambulatory Visit | Attending: Vascular Surgery

## 2016-03-16 ENCOUNTER — Ambulatory Visit (HOSPITAL_COMMUNITY)
Admission: RE | Admit: 2016-03-16 | Discharge: 2016-03-16 | Disposition: A | Discharge status: Home / Self Care | Payer: Medicare Other | Source: Ambulatory Visit | Attending: Vascular Surgery | Admitting: Vascular Surgery

## 2016-03-16 DIAGNOSIS — Z6829 Body mass index (BMI) 29.0-29.9, adult: Secondary | ICD-10-CM | POA: Insufficient documentation

## 2016-03-16 DIAGNOSIS — I739 Peripheral vascular disease, unspecified: Secondary | ICD-10-CM | POA: Insufficient documentation

## 2016-03-16 DIAGNOSIS — L97509 Non-pressure chronic ulcer of other part of unspecified foot with unspecified severity: Secondary | ICD-10-CM | POA: Diagnosis not present

## 2016-03-16 DIAGNOSIS — Z6841 Body Mass Index (BMI) 40.0 and over, adult: Secondary | ICD-10-CM | POA: Diagnosis not present

## 2016-03-16 DIAGNOSIS — N289 Disorder of kidney and ureter, unspecified: Secondary | ICD-10-CM | POA: Diagnosis not present

## 2016-03-16 DIAGNOSIS — R7989 Other specified abnormal findings of blood chemistry: Secondary | ICD-10-CM

## 2016-03-16 DIAGNOSIS — F17210 Nicotine dependence, cigarettes, uncomplicated: Secondary | ICD-10-CM | POA: Diagnosis not present

## 2016-03-16 DIAGNOSIS — E669 Obesity, unspecified: Secondary | ICD-10-CM | POA: Diagnosis not present

## 2016-03-16 DIAGNOSIS — Z5309 Procedure and treatment not carried out because of other contraindication: Secondary | ICD-10-CM | POA: Diagnosis not present

## 2016-03-16 LAB — POCT I-STAT, CHEM 8
BUN: 68 mg/dL — ABNORMAL HIGH (ref 6–20)
Calcium, Ion: 1.26 mmol/L (ref 1.13–1.30)
Chloride: 99 mmol/L — ABNORMAL LOW (ref 101–111)
Creatinine, Ser: 2.3 mg/dL — ABNORMAL HIGH (ref 0.44–1.00)
Glucose, Bld: 235 mg/dL — ABNORMAL HIGH (ref 65–99)
HCT: 40 % (ref 36.0–46.0)
Hemoglobin: 13.6 g/dL (ref 12.0–15.0)
Potassium: 4.2 mmol/L (ref 3.5–5.1)
Sodium: 139 mmol/L (ref 135–145)
TCO2: 32 mmol/L (ref 0–100)

## 2016-03-16 SURGERY — ABDOMINAL AORTOGRAM W/LOWER EXTREMITY

## 2016-03-16 MED ORDER — SODIUM CHLORIDE 0.9 % IV SOLN
INTRAVENOUS | Status: DC
Start: 2016-03-16 — End: 2016-03-16
  Administered 2016-03-16: 06:00:00 via INTRAVENOUS

## 2016-03-16 NOTE — Progress Notes (Signed)
[facility]  Subjective  - pt scheduled for agram today.  Says her right foot is a little better.  She has no wounds on the foot   Objective 170/85 94 98.8 F (37.1 C) (Oral) 16 95% No intake or output data in the 24 hours ending 03/16/16 0742  Right foot pink warm no open wounds  No current facility-administered medications on file prior to encounter.   Current Outpatient Prescriptions on File Prior to Encounter  Medication Sig Dispense Refill  . albuterol (PROVENTIL) (2.5 MG/3ML) 0.083% nebulizer solution Take 2.5 mg by nebulization every 6 (six) hours as needed for wheezing or shortness of breath.    . allopurinol (ZYLOPRIM) 100 MG tablet Take 100 mg by mouth every morning.     . B Complex Vitamins (VITAMIN B COMPLEX PO) Take 1 tablet by mouth 2 (two) times daily.     . budesonide-formoterol (SYMBICORT) 160-4.5 MCG/ACT inhaler Inhale 2 puffs into the lungs 2 (two) times daily.    . cetirizine (ZYRTEC) 10 MG tablet Take 10 mg by mouth daily as needed for allergies.     Marland Kitchen clopidogrel (PLAVIX) 75 MG tablet TAKE 1 TABLET EVERY DAY 90 tablet 1  . diclofenac sodium (VOLTAREN) 1 % GEL Apply 4 g topically 4 (four) times daily as needed (For pain.).     Marland Kitchen Dulaglutide (TRULICITY) 4.43 XV/4.0GQ SOPN Inject 0.75 mg into the skin every Monday.     . esomeprazole (NEXIUM) 40 MG capsule Take 40 mg by mouth daily.     . fenofibrate (TRICOR) 145 MG tablet TAKE 1 TABLET (145 MG TOTAL) BY MOUTH DAILY. 90 tablet 2  . fluticasone (FLONASE) 50 MCG/ACT nasal spray Place 2 sprays into the nose as needed for allergies.     . furosemide (LASIX) 40 MG tablet Take 120 mg by mouth daily.     . insulin aspart protamine- aspart (NOVOLOG MIX 70/30) (70-30) 100 UNIT/ML injection Inject 40-50 Units into the skin 2 (two) times daily with a meal. Take 50 units in the morning and 40 units at bedtime.    Marland Kitchen levothyroxine (SYNTHROID, LEVOTHROID) 137 MCG tablet Take 150 mcg by mouth daily  before breakfast.     . mirabegron ER (MYRBETRIQ) 50 MG TB24 tablet Take 50 mg by mouth daily.    . naftifine (NAFTIN) 1 % cream APPLY TO THE AFFECTED AREA ON SKIN EVERY DAY 60 g 0  . nortriptyline (PAMELOR) 10 MG capsule Take 10 mg by mouth at bedtime.    . ONE TOUCH ULTRA TEST test strip 1 each by Other route 3 (three) times daily as needed (testing blood sugar.).     Marland Kitchen oxyCODONE-acetaminophen (PERCOCET/ROXICET) 5-325 MG per tablet Take 1 tablet by mouth every 6 (six) hours as needed for moderate pain or severe pain.   0  . polyvinyl alcohol (LIQUIFILM TEARS) 1.4 % ophthalmic solution Place 2 drops into both eyes daily as needed for dry eyes.    . potassium chloride SA (K-DUR,KLOR-CON) 20 MEQ tablet Take 40-80 mEq by mouth 2 (two) times daily. Take four tablets in the morning and two tablets at bedtime.    Marland Kitchen tiZANidine (ZANAFLEX) 4 MG capsule Take 4 mg by mouth 2 (two) times daily.        Assessment/Planning:  Pt with PAD some element of rest pain right foot but found to have creatinine above 2 today.  She will not be a good candidate for CO2 contrast due to her obesity making  image quality poor.  She would be at high risk for contrast nephropathy and permanent dialysis if we proceed.  Will arrange for outpt appt with Dr Florene Glen to see if her function can be improved any.  Otherwise may defer agram until her foot gets worse with open wound and also have her continue to try to quit smoking.  Will arrange for outpt appt with pt to see me after seeing Dr Gretchen Portela, Juanda Crumble 03/16/2016 7:42 AM --  Laboratory Lab Results:  Recent Labs  03/16/16 0639  HGB 13.6  HCT 40.0   BMET  Recent Labs  03/16/16 0639  NA 139  K 4.2  CL 99*  GLUCOSE 235*  BUN 68*  CREATININE 2.30*    COAG Lab Results  Component Value Date   INR 1.09 03/22/2015   INR 1.04 08/04/2014   INR 1.03 06/29/2014   No results found for: PTT

## 2016-03-16 NOTE — Progress Notes (Signed)
Pt procedure cancelled due to kidney function.  Pt instruction to follow up with Dr Oneida Alar .

## 2016-03-31 ENCOUNTER — Emergency Department: Admit: 2016-03-31 | Payer: PRIVATE HEALTH INSURANCE

## 2016-03-31 ENCOUNTER — Inpatient Hospital Stay
Admit: 2016-03-31 | Discharge: 2016-04-01 | Disposition: A | Discharge status: Home / Self Care | Payer: PRIVATE HEALTH INSURANCE | Attending: Emergency Medicine

## 2016-03-31 DIAGNOSIS — I739 Peripheral vascular disease, unspecified: Secondary | ICD-10-CM

## 2016-03-31 LAB — METABOLIC PANEL, COMPREHENSIVE
A-G Ratio: 0.8 (ref 0.8–1.7)
ALT (SGPT): 22 U/L (ref 13–56)
AST (SGOT): 26 U/L (ref 15–37)
Albumin: 3.4 g/dL (ref 3.4–5.0)
Alk. phosphatase: 77 U/L (ref 45–117)
Anion gap: 10 mmol/L (ref 3.0–18)
BUN/Creatinine ratio: 20 (ref 12–20)
BUN: 56 MG/DL — ABNORMAL HIGH (ref 7.0–18)
Bilirubin, total: 0.3 MG/DL (ref 0.2–1.0)
CO2: 28 mmol/L (ref 21–32)
Calcium: 9.4 MG/DL (ref 8.5–10.1)
Chloride: 100 mmol/L (ref 100–108)
Creatinine: 2.81 MG/DL — ABNORMAL HIGH (ref 0.6–1.3)
GFR est AA: 20 mL/min/{1.73_m2} — ABNORMAL LOW (ref >60)
GFR est non-AA: 17 mL/min/{1.73_m2} — ABNORMAL LOW (ref >60)
Globulin: 4.1 g/dL — ABNORMAL HIGH (ref 2.0–4.0)
Glucose: 214 mg/dL — ABNORMAL HIGH (ref 74–99)
Potassium: 4.5 mmol/L (ref 3.5–5.5)
Protein, total: 7.5 g/dL (ref 6.4–8.2)
Sodium: 138 mmol/L (ref 136–145)

## 2016-03-31 LAB — CBC WITH AUTOMATED DIFF
ABS. BASOPHILS: 0 10*3/uL (ref 0.0–0.06)
ABS. EOSINOPHILS: 0.1 10*3/uL (ref 0.0–0.4)
ABS. LYMPHOCYTES: 2 10*3/uL (ref 0.9–3.6)
ABS. MONOCYTES: 0.5 10*3/uL (ref 0.05–1.2)
ABS. NEUTROPHILS: 9.2 10*3/uL — ABNORMAL HIGH (ref 1.8–8.0)
BASOPHILS: 0 % (ref 0–2)
EOSINOPHILS: 1 % (ref 0–5)
HCT: 39.3 % (ref 35.0–45.0)
HGB: 12.3 g/dL (ref 12.0–16.0)
LYMPHOCYTES: 17 % — ABNORMAL LOW (ref 21–52)
MCH: 26.7 PG (ref 24.0–34.0)
MCHC: 31.3 g/dL (ref 31.0–37.0)
MCV: 85.4 FL (ref 74.0–97.0)
MONOCYTES: 4 % (ref 3–10)
MPV: 10.7 FL (ref 9.2–11.8)
NEUTROPHILS: 78 % — ABNORMAL HIGH (ref 40–73)
PLATELET: 302 10*3/uL (ref 135–420)
RBC: 4.6 M/uL (ref 4.20–5.30)
RDW: 17.3 % — ABNORMAL HIGH (ref 11.6–14.5)
WBC: 11.7 10*3/uL (ref 4.6–13.2)

## 2016-03-31 LAB — URINALYSIS W/ RFLX MICROSCOPIC
Bilirubin: NEGATIVE
Blood: NEGATIVE
Glucose: 500 mg/dL — AB
Ketone: NEGATIVE mg/dL
Leukocyte Esterase: NEGATIVE
Nitrites: NEGATIVE
Protein: NEGATIVE mg/dL
Specific gravity: 1.015 (ref 1.005–1.030)
Urobilinogen: 0.2 EU/dL (ref 0.2–1.0)
pH (UA): 5 (ref 5.0–8.0)

## 2016-03-31 LAB — NT-PRO BNP: NT pro-BNP: 1822 PG/ML — ABNORMAL HIGH (ref 0–900)

## 2016-03-31 MED ORDER — DIPHENHYDRAMINE HCL 50 MG/ML IJ SOLN
50 mg/mL | INTRAMUSCULAR | Status: AC
Start: 2016-03-31 — End: 2016-03-31
  Administered 2016-03-31: 22:00:00 via INTRAVENOUS

## 2016-03-31 MED ORDER — MORPHINE 4 MG/ML SYRINGE
4 mg/mL | INTRAMUSCULAR | Status: AC
Start: 2016-03-31 — End: 2016-03-31
  Administered 2016-03-31: via INTRAVENOUS

## 2016-03-31 MED ORDER — MORPHINE 4 MG/ML SYRINGE
4 mg/mL | INTRAMUSCULAR | Status: AC
Start: 2016-03-31 — End: 2016-03-31
  Administered 2016-03-31: 22:00:00 via INTRAVENOUS

## 2016-03-31 MED FILL — MORPHINE 4 MG/ML SYRINGE: 4 mg/mL | INTRAMUSCULAR | Qty: 1

## 2016-03-31 MED FILL — DIPHENHYDRAMINE HCL 50 MG/ML IJ SOLN: 50 mg/mL | INTRAMUSCULAR | Qty: 1

## 2016-03-31 NOTE — Procedures (Signed)
[facility]  *** FINAL REPORT ***    Name: Mao, Arthella  MRN: [id number]    Outpatient  DOB: 14 Oct 1950  HIS Order #: [id number]  TRAKnet Visit #: [id number]  Date: 31 Mar 2016    TYPE OF TEST: Peripheral Arterial Testing    REASON FOR TEST  Pulseless    Right Leg  Doppler:    Abnormal  Ankle/Brachial: 0.35    Left Leg  Doppler:    Abnormal  Ankle/Brachial: 0.75    INTERPRETATION/FINDINGS  Physiologic testing was performed using continuous wave Doppler and  segmental pressures.  1. Severe peripheral arterial disease indicated at rest in the right  leg at the level of the superficial femoral artery.  2. Moderate peripheral arterial disease indicated at rest in the left  leg at the level of the superficial femoral artery.  3. The right ankle/brachial index is 0.35 and the left ankle/brachial  index is 0.75.  4. The right digit/brachial index is 0.34 and the left digit/brachial  index is 0.63.    ADDITIONAL COMMENTS  Stacy Forbes, PA-C given a verbal preliminary.    I have personally reviewed the data relevant to the interpretation of  this  study.    TECHNOLOGIST: Theresa Trowbridge, RVT  Signed: 03/31/2016 09:41 PM    PHYSICIAN: Frederick N. Amarius Toto, MD  Signed: 04/01/2016 11:26 PM

## 2016-03-31 NOTE — Procedures (Signed)
[facility]  *** FINAL REPORT ***    Name: Cheyenne French, Cheyenne French  MRN: [id number]    Outpatient  DOB: 14 Oct 1950  HIS Order #: [id number]  TRAKnet Visit #: [id number]  Date: 31 Mar 2016    TYPE OF TEST: Peripheral Venous Testing    REASON FOR TEST  Pain in limb, Limb swelling    Right Leg:-  Deep venous thrombosis:           No  Superficial venous thrombosis:    No  Deep venous insufficiency:        Not examined  Superficial venous insufficiency: Not examined      INTERPRETATION/FINDINGS  Duplex images were obtained using 2-D gray scale, color flow, and  spectral Doppler analysis.  Right leg :  1. Deep vein(s) visualized include the common femoral, deep femoral,  proximal femoral, mid femoral, distal femoral, popliteal(above knee),  popliteal(fossa), popliteal(below knee), posterior tibial and peroneal   veins.  2. No evidence of deep venous thrombosis detected in the veins  visualized.  3. No evidence of deep vein thrombosis in the contralateral common  femoral vein.  4. No evidence of superficial thrombosis detected.    ADDITIONAL COMMENTS    I have personally reviewed the data relevant to the interpretation of  this  study.    TECHNOLOGIST: Theresa Trowbridge, RVT  Signed: 03/31/2016 06:55 PM    PHYSICIAN: Frederick N. Shadi Larner, MD  Signed: 04/01/2016 11:26 PM

## 2016-03-31 NOTE — ED Notes (Signed)
I have reviewed discharge instructions with the patient and family member.  The patient and family member verbalized understanding. Patient NAD, VSS.  Patient armband removed and shredded.

## 2016-03-31 NOTE — ED Triage Notes (Signed)
Patient c/o right leg pain and swelling.

## 2016-03-31 NOTE — ED Provider Notes (Signed)
HPI Comments: Patient is a 65 y/o female w/ PMH TIA, CKD, BL leg edema, blood clots, HTN, who presents to the ER via EMS c/o right leg pain and heaviness.  Patient states her pain in the right leg last night, and has worsened since.  She was at a family picnic this afternoon, when she stood up, stating her right leg felt heavy.  This occurred around 1430.  Patient states she drove from [facility] yesterday.  She stopped taking her HTN meds back in February.  She denied any headache, dizziness, SOB, chest pain, abdominal pain, nausea, vomiting, numbness or tingling and has no other complaints.  Patient states she is taking high doses of lasix due to the swelling in her legs.      Patient is a 65 y.o. female presenting with leg pain. The history is provided by the patient.   Leg Pain           No past medical history on file.    No past surgical history on file.      No family history on file.    Social History     Social History   ??? Marital status: SINGLE     Spouse name: N/A   ??? Number of children: N/A   ??? Years of education: N/A     Occupational History   ??? Not on file.     Social History Main Topics   ??? Smoking status: Not on file   ??? Smokeless tobacco: Not on file   ??? Alcohol use Not on file   ??? Drug use: Not on file   ??? Sexual activity: Not on file     Other Topics Concern   ??? Not on file     Social History Narrative         ALLERGIES: Ace inhibitors and Morphine    Review of Systems   Constitutional: Negative for chills, fatigue and fever.   HENT: Negative.  Negative for sore throat.    Eyes: Negative.    Respiratory: Negative for cough and shortness of breath.    Cardiovascular: Negative for chest pain and palpitations.   Gastrointestinal: Negative for abdominal pain, nausea and vomiting.   Genitourinary: Negative for dysuria.   Musculoskeletal: Negative.    Skin: Negative.    Neurological: Positive for weakness. Negative for dizziness, light-headedness and headaches.        Right leg pain, heaviness    Psychiatric/Behavioral: Negative.    All other systems reviewed and are negative.      Vitals:    03/31/16 1603   BP: (!) 176/109   Pulse: (!) 115   Resp: 18   Temp: 97.4 ??F (36.3 ??C)   SpO2: 96%            Physical Exam   Constitutional: She is oriented to person, place, and time. She appears well-developed and well-nourished. No distress.   Morbidly obese   HENT:   Head: Normocephalic and atraumatic.   Mouth/Throat: Oropharynx is clear and moist.   Eyes: Conjunctivae and EOM are normal. Pupils are equal, round, and reactive to light. No scleral icterus.   Neck: Neck supple. No JVD present. No tracheal deviation present.   Cardiovascular: Regular rhythm and normal heart sounds.  Tachycardia present.    Pulmonary/Chest: Effort normal and breath sounds normal. No respiratory distress. She has no wheezes. She has no rales. She exhibits no tenderness.   Abdominal: Soft. She exhibits no distension. There is no tenderness.  There is no rebound and no guarding.   Musculoskeletal: Normal range of motion.        Right ankle: She exhibits swelling and abnormal pulse.        Right lower leg: She exhibits swelling and edema.        Left lower leg: She exhibits swelling and edema.   + posterior tibialis pulse with doppler; unable to obtain dorsalis pedis pulse   Neurological: She is alert and oriented to person, place, and time. She has normal strength. No cranial nerve deficit. GCS eye subscore is 4. GCS verbal subscore is 5. GCS motor subscore is 6.   Stroke screen negative; CN 2-12 grossly in tact   Skin: Skin is warm and dry. She is not diaphoretic.   Psychiatric: She has a normal mood and affect.   Nursing note and vitals reviewed.       MDM  Number of Diagnoses or Management Options  Essential hypertension:   PAD (peripheral artery disease) ([place]):   Right foot pain:   Diagnosis management comments: 4:51 PM  65 y/o female w/ significant PMH presents to ER via EMS c/o right leg  pain/heaviness onset earlier today.  States she is feeling better now, however concerned due to previous mini-stroke hx in past.  Also currently on plavix; unsure for what.  States hx of clots, and TIA.  Stroke screen negative at this time.  Will plan on labs, imaging and reeval.  Randolm Idol, PA-C    7:10 PM  Venous duplex negative for acute DVT.  Discussed pt with Dr. Louretta Parma, who also evaluated patient.  Pain seems out of proportion to exam.  Findings not really consistent with cellulitis or infection.  Will obtain arterial duplex to rule out acute blockage.  No white count.  UA unremarkable.  No shift noted in electrolytes.  Will medicate for pain control and reeval.  Randolm Idol, PA-C    8:48 PM  Vascular at bedside.  Having difficulty obtaining ABI due to pt stating pain in leg/cramping is worsening.  Will plan on IV Valium for pain relief.  Randolm Idol, PA-C    9:36 PM  Per vascular, pt noted to have severe disease in right leg, and moderate disease in left.  Discussed this with pt.  Per patient, she is already established with vascular in [place].  They have discussed options to improve blood flow to the right leg already.  Patient states she forgot to bring her percocet with her to [place], and is requesting a refill until returning home tomorrow.  Will plan on small prescription.  Also discussed BP readings.  Pt advised to follow up with pcp since she has decided to stop taking meds just because.  Strict return precautions given.  All questions answered and patient in agreement with plan of care.  Will plan for discharge.  Randolm Idol, PA-C      Clinical Impression:  Right leg pain, PAD, HTN         Amount and/or Complexity of Data Reviewed  Clinical lab tests: ordered and reviewed  Tests in the radiology section of CPT??: ordered and reviewed    Risk of Complications, Morbidity, and/or Mortality  Presenting problems: moderate  Diagnostic procedures: moderate  Management options: moderate     Patient Progress  Patient progress: stable    ED Course       Procedures           Vitals:  Patient Vitals  for the past 12 hrs:   Temp Pulse Resp BP SpO2   03/31/16 1603 97.4 ??F (36.3 ??C) (!) 115 18 (!) 176/109 96 %         Medications ordered:   Medications   morphine injection 4 mg (4 mg IntraVENous Given 03/31/16 1805)   diphenhydrAMINE (BENADRYL) injection 25 mg (25 mg IntraVENous Given 03/31/16 1804)   morphine injection 4 mg (4 mg IntraVENous Given 03/31/16 1934)   diazePAM (VALIUM) injection 5 mg (5 mg IntraVENous Given 03/31/16 2052)         Lab findings:  Recent Results (from the past 12 hour(s))   CBC WITH AUTOMATED DIFF    Collection Time: 03/31/16  4:52 PM   Result Value Ref Range    WBC 11.7 4.6 - 13.2 K/uL    RBC 4.60 4.20 - 5.30 M/uL    HGB 12.3 12.0 - 16.0 g/dL    HCT 39.3 35.0 - 45.0 %    MCV 85.4 74.0 - 97.0 FL    MCH 26.7 24.0 - 34.0 PG    MCHC 31.3 31.0 - 37.0 g/dL    RDW 17.3 (H) 11.6 - 14.5 %    PLATELET 302 135 - 420 K/uL    MPV 10.7 9.2 - 11.8 FL    NEUTROPHILS 78 (H) 40 - 73 %    LYMPHOCYTES 17 (L) 21 - 52 %    MONOCYTES 4 3 - 10 %    EOSINOPHILS 1 0 - 5 %    BASOPHILS 0 0 - 2 %    ABS. NEUTROPHILS 9.2 (H) 1.8 - 8.0 K/UL    ABS. LYMPHOCYTES 2.0 0.9 - 3.6 K/UL    ABS. MONOCYTES 0.5 0.05 - 1.2 K/UL    ABS. EOSINOPHILS 0.1 0.0 - 0.4 K/UL    ABS. BASOPHILS 0.0 0.0 - 0.06 K/UL    DF AUTOMATED     METABOLIC PANEL, COMPREHENSIVE    Collection Time: 03/31/16  4:52 PM   Result Value Ref Range    Sodium 138 136 - 145 mmol/L    Potassium 4.5 3.5 - 5.5 mmol/L    Chloride 100 100 - 108 mmol/L    CO2 28 21 - 32 mmol/L    Anion gap 10 3.0 - 18 mmol/L    Glucose 214 (H) 74 - 99 mg/dL    BUN 56 (H) 7.0 - 18 MG/DL    Creatinine 2.81 (H) 0.6 - 1.3 MG/DL    BUN/Creatinine ratio 20 12 - 20      GFR est AA 20 (L) >60 ml/min/1.2m    GFR est non-AA 17 (L) >60 ml/min/1.715m   Calcium 9.4 8.5 - 10.1 MG/DL    Bilirubin, total 0.3 0.2 - 1.0 MG/DL    ALT (SGPT) 22 13 - 56 U/L    AST (SGOT) 26 15 - 37 U/L     Alk. phosphatase 77 45 - 117 U/L    Protein, total 7.5 6.4 - 8.2 g/dL    Albumin 3.4 3.4 - 5.0 g/dL    Globulin 4.1 (H) 2.0 - 4.0 g/dL    A-G Ratio 0.8 0.8 - 1.7     PRO-BNP    Collection Time: 03/31/16  4:52 PM   Result Value Ref Range    NT pro-BNP 1822 (H) 0 - 900 PG/ML   URINALYSIS W/ RFLX MICROSCOPIC    Collection Time: 03/31/16  4:53 PM   Result Value Ref Range    Color DARK YELLOW  Appearance CLEAR      Specific gravity 1.015 1.005 - 1.030      pH (UA) 5.0 5.0 - 8.0      Protein NEGATIVE  NEG mg/dL    Glucose 500 (A) NEG mg/dL    Ketone NEGATIVE  NEG mg/dL    Bilirubin NEGATIVE  NEG      Blood NEGATIVE  NEG      Urobilinogen 0.2 0.2 - 1.0 EU/dL    Nitrites NEGATIVE  NEG      Leukocyte Esterase NEGATIVE  NEG             X-Ray, CT or other radiology findings or impressions:  ANKLE BRACHIAL INDEX         DUPLEX LOWER EXT VENOUS RIGHT         CT HEAD WO CONT    (Results Pending)           Disposition:  Diagnosis:   1. Right foot pain    2. PAD (peripheral artery disease) ([place])    3. Essential hypertension        Disposition: discharged    Follow-up Information     Follow up With Details Comments Contact Info    [facility] EMERGENCY DEPT  If symptoms worsen [place] [id number]  [phone]           Patient's Medications   Start Taking    DIAZEPAM (VALIUM) 5 MG TABLET    Take 1 Tab by mouth every eight (8) hours as needed (pain, cramps). Max Daily Amount: 15 mg.    OXYCODONE-ACETAMINOPHEN (PERCOCET) 5-325 MG PER TABLET    Take 1 Tab by mouth every six (6) hours as needed for Pain. Max Daily Amount: 4 Tabs.   Continue Taking    ESOMEPRAZOLE (NEXIUM) 40 MG CAPSULE    Take 40 mg by mouth daily.    FUROSEMIDE (LASIX) 80 MG TABLET    Take 240 mg by mouth daily. Patient says that she takes 160 mg in the morning and 80 mg at night   These Medications have changed    No medications on file   Stop Taking    No medications on file

## 2016-04-01 MED ORDER — DIAZEPAM 5 MG TAB
5 mg | ORAL_TABLET | Freq: Three times a day (TID) | ORAL | 0 refills | Status: AC | PRN
Start: 2016-04-01 — End: ?

## 2016-04-01 MED ORDER — DIAZEPAM 5 MG/ML SYRINGE
5 mg/mL | Freq: Once | INTRAMUSCULAR | Status: AC
Start: 2016-04-01 — End: 2016-03-31
  Administered 2016-04-01: 01:00:00 via INTRAVENOUS

## 2016-04-01 MED ORDER — OXYCODONE-ACETAMINOPHEN 5 MG-325 MG TAB
5-325 mg | ORAL_TABLET | Freq: Four times a day (QID) | ORAL | 0 refills | Status: AC | PRN
Start: 2016-04-01 — End: ?

## 2016-04-01 MED FILL — DIAZEPAM 5 MG/ML SYRINGE: 5 mg/mL | INTRAMUSCULAR | Qty: 2

## 2016-04-01 NOTE — Procedures (Signed)
[facility]  *** FINAL REPORT ***    Name: Cheyenne French, Cheyenne French  MRN: [id number]    Outpatient  DOB: 14 Oct 1950  HIS Order #: [id number]  TRAKnet Visit #: [id number]  Date: 31 Mar 2016    TYPE OF TEST: Peripheral Venous Testing    REASON FOR TEST  Pain in limb, Limb swelling    Right Leg:-  Deep venous thrombosis:           No  Superficial venous thrombosis:    No  Deep venous insufficiency:        Not examined  Superficial venous insufficiency: Not examined      INTERPRETATION/FINDINGS  Duplex images were obtained using 2-D gray scale, color flow, and  spectral Doppler analysis.  Right leg :  1. Deep vein(s) visualized include the common femoral, deep femoral,  proximal femoral, mid femoral, distal femoral, popliteal(above knee),  popliteal(fossa), popliteal(below knee), posterior tibial and peroneal   veins.  2. No evidence of deep venous thrombosis detected in the veins  visualized.  3. No evidence of deep vein thrombosis in the contralateral common  femoral vein.  4. No evidence of superficial thrombosis detected.    ADDITIONAL COMMENTS    I have personally reviewed the data relevant to the interpretation of  this  study.    TECHNOLOGIST: Darcel Smallingheresa Trowbridge, RVT  Signed: 03/31/2016 06:55 PM    PHYSICIAN: Modena SlaterFrederick N. Keondra Haydu, MD  Signed: 04/01/2016 11:26 PM

## 2016-04-01 NOTE — Procedures (Signed)
[facility]  *** [facility] ***    Name: Cheyenne SiasRICK, Nadirah  MRN: [id number]    Outpatient  DOB: 14 Oct 1950  HIS Order #: [id number]  TRAKnet Visit #: [id number]  Date: 31 Mar 2016    TYPE OF TEST: Peripheral Arterial Testing    REASON FOR TEST  Pulseless    Right Leg  Doppler:    Abnormal  Ankle/Brachial: 0.35    Left Leg  Doppler:    Abnormal  Ankle/Brachial: 0.75    INTERPRETATION/FINDINGS  Physiologic testing was performed using continuous wave Doppler and  segmental pressures.  1. Severe peripheral arterial disease indicated at rest in the right  leg at the level of the superficial femoral artery.  2. Moderate peripheral arterial disease indicated at rest in the left  leg at the level of the superficial femoral artery.  3. The right ankle/brachial index is 0.35 and the left ankle/brachial  index is 0.75.  4. The right digit/brachial index is 0.34 and the left digit/brachial  index is 0.63.    ADDITIONAL COMMENTS  Janit BernStacy Forbes, PA-C given a verbal preliminary.    I have personally reviewed the data relevant to the interpretation of  this  study.    TECHNOLOGIST: Darcel Smallingheresa Trowbridge, RVT  Signed: 03/31/2016 09:41 PM    PHYSICIAN: Modena SlaterFrederick N. Jamil Castillo, MD  Signed: 04/01/2016 11:26 PM

## 2016-04-03 ENCOUNTER — Telehealth: Payer: Self-pay

## 2016-04-03 ENCOUNTER — Telehealth: Payer: Self-pay | Admitting: Vascular Surgery

## 2016-04-03 NOTE — Telephone Encounter (Signed)
Spoke to [facility], moved appt w/ Dr. Florene Glen to 04/04/16 - pt aware. Resched [facility] appt to 04/12/16; labs at 9:30 and MD at 11:45. Spoke to pt to inform them of appts.

## 2016-04-03 NOTE — Telephone Encounter (Signed)
Pt. called to inform Dr. Oneida Alar that she recently had tests done on her legs at a [facility].  Reported she was advised the "right leg has severe blockage" and the left leg has "moderate blockage", in the arterial system.  Reported she was to have an arteriogram done, but due to kidney function, was not able to get this done.  The pt. stated she has an appt. with Dr. Florene Glen 7/12, and was told by Dr. Oneida Alar that our office would try to move that appt. To an earlier date.  C/o pain right leg and foot with walking, and doesn't stop with rest.  Reported she was told, due to the blockage, she would risk losing her right leg.  Advised pt. Will attempt to move appt. Forward with Dr. Florene Glen and contact her re: this.  Also will check with Dr. Oneida Alar re: next step in evaluation.  Encouraged pt. to obtain copies of the vascular studies done at the [facility]. Agreed with plan.

## 2016-04-03 NOTE — Telephone Encounter (Signed)
-----   Message from Mena Goes, RN sent at 03/16/2016  1:13 PM EDT ----- Regarding: schedule   ----- Message -----    From: Elam Dutch, MD    Sent: 03/16/2016   7:47 AM      To: [facility] Charge Pool  Angio cancelled today due to elevated creatinine Please arrange for pt to see Dr Florene Glen to see if there is anything we can do to improve kidney function  Please have pt see me in office with bilateral ABI and bilateral duplex after she sees [employer]

## 2016-04-03 NOTE — Telephone Encounter (Signed)
[name] Salt Pullins, RN  P [facility] Admin Pool            This pt. Was seen by [facility] 6/1; scheduled for Aortogram 6/9, which had to be cancelled due to worsening renal function. Per a note by Dr. Oneida Alar on 6/9, pt. Needs to see Dr. Florene Glen, to try to see if renal function can be improved. She has an appt. with Dr. Florene Glen on 7/12; Can we try to move that to an earlier date, since she reports that recent testing at a [facility] revealed she has severe blockage in right LE? She reported her Right LE symptoms have worsened.

## 2016-04-04 NOTE — Telephone Encounter (Signed)
RE: need recommendation  Received: Today    Elam Dutch, MD  Denman George, RN           Do not schedule agram. Needs office visit with me after seeing Tomi Likens

## 2016-04-11 ENCOUNTER — Encounter: Payer: Self-pay | Admitting: Vascular Surgery

## 2016-04-12 ENCOUNTER — Ambulatory Visit (HOSPITAL_COMMUNITY)
Admission: RE | Admit: 2016-04-12 | Discharge: 2016-04-12 | Disposition: A | Discharge status: Home / Self Care | Payer: Medicare Other | Source: Ambulatory Visit | Attending: Vascular Surgery | Admitting: Vascular Surgery

## 2016-04-12 ENCOUNTER — Ambulatory Visit (INDEPENDENT_AMBULATORY_CARE_PROVIDER_SITE_OTHER)
Admission: RE | Admit: 2016-04-12 | Discharge: 2016-04-12 | Disposition: A | Discharge status: Home / Self Care | Payer: Medicare Other | Source: Ambulatory Visit | Attending: Vascular Surgery | Admitting: Vascular Surgery

## 2016-04-12 ENCOUNTER — Encounter: Payer: Self-pay | Admitting: Vascular Surgery

## 2016-04-12 ENCOUNTER — Encounter: Payer: Self-pay | Admitting: *Deleted

## 2016-04-12 ENCOUNTER — Ambulatory Visit (INDEPENDENT_AMBULATORY_CARE_PROVIDER_SITE_OTHER): Payer: Medicare Other | Admitting: Vascular Surgery

## 2016-04-12 VITALS — BP 162/91 | HR 100 | Temp 98.2°F | Ht 64.5 in | Wt 309.0 lb

## 2016-04-12 DIAGNOSIS — E785 Hyperlipidemia, unspecified: Secondary | ICD-10-CM | POA: Diagnosis not present

## 2016-04-12 DIAGNOSIS — K219 Gastro-esophageal reflux disease without esophagitis: Secondary | ICD-10-CM | POA: Insufficient documentation

## 2016-04-12 DIAGNOSIS — E1122 Type 2 diabetes mellitus with diabetic chronic kidney disease: Secondary | ICD-10-CM | POA: Diagnosis not present

## 2016-04-12 DIAGNOSIS — I129 Hypertensive chronic kidney disease with stage 1 through stage 4 chronic kidney disease, or unspecified chronic kidney disease: Secondary | ICD-10-CM | POA: Diagnosis not present

## 2016-04-12 DIAGNOSIS — I70203 Unspecified atherosclerosis of native arteries of extremities, bilateral legs: Secondary | ICD-10-CM | POA: Diagnosis not present

## 2016-04-12 DIAGNOSIS — R0989 Other specified symptoms and signs involving the circulatory and respiratory systems: Secondary | ICD-10-CM | POA: Diagnosis present

## 2016-04-12 DIAGNOSIS — I739 Peripheral vascular disease, unspecified: Secondary | ICD-10-CM

## 2016-04-12 DIAGNOSIS — N189 Chronic kidney disease, unspecified: Secondary | ICD-10-CM | POA: Diagnosis not present

## 2016-04-12 DIAGNOSIS — G4730 Sleep apnea, unspecified: Secondary | ICD-10-CM | POA: Diagnosis not present

## 2016-04-12 NOTE — Progress Notes (Signed)
Referring Physician: [name], MD  Patient name: [name] [name]        MRN: [id number]       DOB: [birth year]           Sex: female  REASON FOR CONSULT: Right foot pain  HPI: [name] [name] is a 65 y.o. female that we have seen intermittently over the last several years for peripheral arterial disease. She was last seen about 5 years ago. At that time she had bilateral ABIs in the 0.5 range and had some mild claudication symptoms. Over the last few years she has progressed significantly. She now has pain in her right foot intermittently consistent with rest pain. She also has very short claudication distance right leg worse than the left.  Other medical problems include diabetes, previous MRSA infection left groin, hypertension, hyperlipidemia, obesity, asthma, sleep apnea. These are all currently stable. She is on Plavix. She was recently scheduled for an arteriogram time of her procedure she was noted to have an elevated creatinine of 2.3 .  Her procedure was canceled and then she was evaluated by Dr. Florene Glen. She returns today for follow-up after that visit with Dr. Florene Glen. Dr. Tito Dine notes were reviewed and he also expressed concerns that she would be at high risk for contrast nephropathy. He did recommend IV hydration the night prior to the procedure and limiting contrast if possible.    Past Medical History   Diagnosis  Date   .  History of hip replacement, total     .  Hx MRSA infection         abscess left groin   .  Arthritis     .  GERD (gastroesophageal reflux disease)     .  Gout     .  Diabetes mellitus     .  Hypertension     .  Thyroid disease     .  Hyperlipidemia     .  TIA (transient ischemic attack)          X2 NO RESIDUAL PROBLEMS   .  Positive cardiac stress test  12/2013       anterior and lateral ischemia on Myoview   .  Hx of cardiac cath  06/2014   .  Asthma     .  Headache     .  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  Cramping of feet     .  Stress  incontinence     .  History of transfusion     .  Sleep apnea         UNABLE TO TOLERATE C PAP   .  Chronic kidney disease     .  Chronic kidney disease     .  Anemia     .  Bronchitis     .  Chronic pain syndrome     .  Morbidly obese ([place])     .  Hypothyroid     .  Peripheral vascular disease [facility])      Past Surgical History   Procedure  Laterality  Date   .  Thyroidectomy       .  Abdominal hysterectomy       .  Laparoscopic cholecystectomy       .  Forearm fracture surgery           Left arm   .  Hernia repair  w/ mesh   .  Incision and drainage abscess  Left  02/04/2013       Procedure: INCISION AND DRAINAGE LEFT BUTTOCK ABSCESS; INCISION AND DRAINAGE LEFT BREAST ABSCESS;  Surgeon: Harl Bowie, MD;  Location: [place];  Service: General;  Laterality: Left;   .  Irrigation and debridement abscess  N/A  02/06/2013       Procedure: IRRIGATION AND DEBRIDEMENT BUTTOCK ABSCESS AND DRESSING CHANGE;  Surgeon: Harl Bowie, MD;  Location: [place];  Service: General;  Laterality: N/A;   .  Irrigation and debridement abscess  Left  02/08/2013       Procedure: IRRIGATION AND DEBRIDEMENT ABSCESS/DRESSING CHANGE;  Surgeon: Gwenyth Ober, MD;  Location: [place];  Service: General;  Laterality: Left;   .  Incision and drainage perirectal abscess  Left  02/10/2013       Procedure: IRRIGATION AND DEBRIDEMENT OF BUTTOCK/PERINEAL ABSCESS;  Surgeon: [name]. Georgette Dover, MD;  Location: [place];  Service: General;  Laterality: Left;   .  Incision and drainage abscess  N/A  02/12/2013       Procedure: INCISION AND DEBRIDEMENT BUTTOCK WOUND ;  Surgeon: [name]. Georgette Dover, MD;  Location: [place];  Service: General;  Laterality: N/A;   .  Incision and drainage abscess  N/A  02/14/2013       Procedure: INCISION AND DRAINAGE/DRESSING CHANGE;  Surgeon: Harl Bowie, MD;  Location: [place];  Service: General;  Laterality: N/A;   .  Incision and drainage perirectal abscess  N/A  02/16/2013       Procedure:  IRRIGATION AND DEBRIDEMENT PERINEAL ABSCESS;  Surgeon: Zenovia Jarred, MD;  Location: [place];  Service: General;  Laterality: N/A;   .  Joint replacement    2010 / 2012    .  Colon surgery    1995       DUE TO POLYP   .  Total hip revision  Right  08/11/2014       Procedure: RIGHT ACETABULAR REVISION;  Surgeon: Gearlean Alf, MD;  [facility];  Service: Orthopedics;  Laterality: Right;   .  Left heart catheterization with coronary angiogram  N/A  07/02/2014       Procedure: LEFT HEART CATHETERIZATION WITH CORONARY ANGIOGRAM;  Surgeon: Lorretta Harp, MD;  Location: [facility] CATH LAB;  Service: Cardiovascular;  Laterality: N/A;   .  Incision and drainage abscess  N/A  03/21/2015       Procedure: INCISION AND DRAINAGE PUBIC ABSCESS;  Surgeon: Excell Seltzer, MD;  [facility];  Service: General;  Laterality: N/A;       Family History   Problem  Relation  Age of Onset   .  Diabetes  Brother     .  Cardiomyopathy  Mother     .  Heart disease  Mother     .  Cancer  Father       SOCIAL HISTORY: Social History      Social History   .  Marital Status:  Married       Spouse Name:  N/A   .  Number of Children:  1   .  Years of Education:  college      Occupational History   .           Disabled      Social History Main Topics   .  Smoking status:  Current Every Day Smoker -- 0.50 packs/day for 40 years  Types:  Cigarettes   .  Smokeless tobacco:  Never Used   .  Alcohol Use:  0.6 oz/week       1 Standard drinks or equivalent per week         Comment: OCC   .  Drug Use:  No         Comment: Quit four years ago.   Marland Kitchen  Sexual Activity:  Not on file      Other Topics  Concern   .  Not on file      Social History Narrative     Patient is se prated  and lives at home with her friend. Patient is disabled.     Education one year of college     Right handed     Caffeine two cups daily.                 Allergies   Allergen  Reactions   .  Ace Inhibitors          Tongue swell   .  Bystolic [Nebivolol Hcl]  Swelling and Other (See Comments)       Chest pain    .  Morphine And Related  Itching       Current Outpatient Prescriptions   Medication  Sig  Dispense  Refill   .  acetaminophen (TYLENOL) 325 MG tablet  Take 2 tablets (650 mg total) by mouth every 6 (six) hours as needed for mild pain (or Fever >/= 101).  30 tablet  0   .  albuterol (PROVENTIL) (2.5 MG/3ML) 0.083% nebulizer solution  Take 2.5 mg by nebulization every 6 (six) hours as needed for wheezing or shortness of breath.       .  allopurinol (ZYLOPRIM) 100 MG tablet  Take 100 mg by mouth every morning.        .  B Complex Vitamins (VITAMIN B COMPLEX PO)  Take 1 tablet by mouth 2 (two) times daily.        .  budesonide-formoterol (SYMBICORT) 160-4.5 MCG/ACT inhaler  Inhale 2 puffs into the lungs 2 (two) times daily.       .  cetirizine (ZYRTEC) 10 MG tablet  Take 1 tablet by mouth daily as needed. allergies       .  clopidogrel (PLAVIX) 75 MG tablet  TAKE 1 TABLET EVERY DAY  90 tablet  1   .  diclofenac sodium (VOLTAREN) 1 % GEL  Apply 4 g topically 4 (four) times daily.       .  Dulaglutide (TRULICITY) 8.18 EX/9.3ZJ SOPN  Inject 0.75 mg into the skin.       Marland Kitchen  esomeprazole (NEXIUM) 40 MG capsule  Take 40 mg by mouth daily at 12 noon.       .  fenofibrate (TRICOR) 145 MG tablet  TAKE 1 TABLET (145 MG TOTAL) BY MOUTH DAILY.  90 tablet  2   .  fluticasone (FLONASE) 50 MCG/ACT nasal spray  Place 2 sprays into the nose as needed for allergies. Reported on 02/14/2016       .  furosemide (LASIX) 40 MG tablet  Take 40 mg by mouth daily.       .  Garcinia Cambogia-Chromium 500-200 MG-MCG TABS  Take 1,600 mg by mouth daily.       .  insulin aspart protamine- aspart (NOVOLOG MIX 70/30) (70-30) 100 UNIT/ML injection  Inject 30-40 Units into the skin 2 (two) times daily with a meal.  Take 40 units in the AM and 30 units at Night       .  levothyroxine (SYNTHROID, LEVOTHROID) 137 MCG tablet  Take 150 mcg by  mouth daily before breakfast.        .  losartan (COZAAR) 100 MG tablet  Take 1 tablet (100 mg total) by mouth every morning.  90 tablet  3   .  mirabegron ER (MYRBETRIQ) 50 MG TB24 tablet  Take 50 mg by mouth daily.       .  naftifine (NAFTIN) 1 % cream  APPLY TO THE AFFECTED AREA ON SKIN EVERY DAY  60 g  0   .  nortriptyline (PAMELOR) 10 MG capsule  Take 10 mg by mouth at bedtime.       .  ONE TOUCH ULTRA TEST test strip  1 each by Other route 3 (three) times daily as needed (testing blood sugar.).        Marland Kitchen  oxyCODONE-acetaminophen (PERCOCET/ROXICET) 5-325 MG per tablet  Take 1 tablet by mouth every 6 (six) hours as needed for moderate pain or severe pain.     0   .  polyvinyl alcohol (LIQUIFILM TEARS) 1.4 % ophthalmic solution  Place 2 drops into both eyes daily as needed for dry eyes.       .  potassium chloride SA (K-DUR,KLOR-CON) 20 MEQ tablet  Take 20 mEq by mouth 2 (two) times daily.       Marland Kitchen  tiZANidine (ZANAFLEX) 4 MG capsule  Take 4 mg by mouth 3 (three) times daily.       .  Azilsartan Medoxomil (EDARBI) 40 MG TABS  Take 40 mg by mouth daily. Reported on 03/08/2016       .  BYSTOLIC 10 MG tablet  Take 10 mg by mouth daily. Reported on 03/08/2016    2   .  doxycycline (ADOXA) 100 MG tablet  Take 1 tablet (100 mg total) by mouth 2 (two) times daily. (Patient not taking: Reported on 10/12/2015)  24 tablet  0   .  VESICARE 10 MG tablet  Take 10 mg by mouth every morning. Reported on 03/08/2016          No current facility-administered medications for this visit.     ROS:    General:  No weight loss, Fever, chills  HEENT: No recent headaches, no nasal bleeding, no visual changes, no sore throat  Neurologic: No dizziness, blackouts, seizures. No recent symptoms of stroke or mini- stroke. No recent episodes of slurred speech, or temporary blindness.  Cardiac: No recent episodes of chest pain/pressure, no shortness of breath at rest.  No shortness of breath with exertion.  Denies history of  atrial fibrillation or irregular heartbeat  Vascular: No history of rest pain in feet.  + history of claudication.  No history of non-healing ulcer, No history of DVT    Pulmonary: No home oxygen, no productive cough, no hemoptysis,  + asthma or wheezing  Musculoskeletal:  '[ ]'$  Arthritis, '[ ]'$  Low back pain,  '[ ]'$  Joint pain  Hematologic:No history of hypercoagulable state.  No history of easy bleeding.  No history of anemia  Gastrointestinal: No hematochezia or melena,  No gastroesophageal reflux, no trouble swallowing  Urinary: [x ] chronic Kidney disease, '[ ]'$  on HD - '[ ]'$  MWF or '[ ]'$  TTHS, '[ ]'$  Burning with urination, '[ ]'$  Frequent urination, '[ ]'$  Difficulty urinating;    Skin: No rashes  Psychological: No history of anxiety,  No history of depression   Physical Examination    Filed Vitals:   04/12/16 1131 04/12/16 1136  BP: 164/96 162/91  Pulse: 100   Temp: 98.2 F (36.8 C)   TempSrc: Oral   Height: 5' 4.5" (1.638 m)   Weight: 309 lb (140.161 kg)   SpO2: 93%     General:  Alert and oriented, no acute distress HEENT: Normal Skin: No rash, right foot rubor no ulcer Extremity Pulses:  2+ radial, brachial, femoral, Absent dorsalis pedis, posterior tibial pulses bilaterally Musculoskeletal: No deformity trace right ankle edema dependent rubor right foot no ulcers   Neurologic: Upper and lower extremity motor 5/5 and symmetric  DATA:  Patient had repeat ABIs today which were 0 on the right side and 0.45 on the left side. Arterial duplex suggested right popliteal artery stenosis as well as left superficial femoral artery stenosis. The tibial vessels were not visualized distally in the right leg.  ASSESSMENT:  Worsening peripheral arterial disease right foot now threatened.  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 I discussed with the patient today that this may be unreconstructable. I also discussed with her that without intervention she is at high risk of  losing her leg. She may lose her leg in either case. I also discussed with her the findings of her recent visit with Dr. Florene Glen and discussed with her that she will be at high risk for contrast nephropathy and possible dialysis. We will admit her to the hospital the night before and hydrate her in order to try to reduce this risk. Unfortunately due to the patient's obesity she is not a very good candidate for CO2 contrast as this usually does not provide very good visualization but we will attempt this. She also was informed she is high risk because of her obesity for bleeding complications from the groin.   PLAN:  Aortogram bilateral lower extremity runoff possible intervention scheduled for April 20, 2016.   Hopefully a percutaneous option will exist for her. It was again strongly emphasized to the patient today to quit smoking.   [name], MD [facility] [place] Office: [phone] Pager: [phone]

## 2016-04-19 ENCOUNTER — Encounter (HOSPITAL_COMMUNITY): Payer: Self-pay | Admitting: General Practice

## 2016-04-19 ENCOUNTER — Inpatient Hospital Stay (HOSPITAL_COMMUNITY)
Admission: RE | Admit: 2016-04-19 | Discharge: 2016-04-21 | DRG: 253 | Disposition: A | Discharge status: Home / Self Care | Payer: Medicare Other | Source: Ambulatory Visit | Attending: Vascular Surgery | Admitting: Vascular Surgery

## 2016-04-19 DIAGNOSIS — E1122 Type 2 diabetes mellitus with diabetic chronic kidney disease: Secondary | ICD-10-CM | POA: Diagnosis not present

## 2016-04-19 DIAGNOSIS — K219 Gastro-esophageal reflux disease without esophagitis: Secondary | ICD-10-CM | POA: Diagnosis not present

## 2016-04-19 DIAGNOSIS — E1151 Type 2 diabetes mellitus with diabetic peripheral angiopathy without gangrene: Secondary | ICD-10-CM | POA: Diagnosis present

## 2016-04-19 DIAGNOSIS — Z96641 Presence of right artificial hip joint: Secondary | ICD-10-CM | POA: Diagnosis present

## 2016-04-19 DIAGNOSIS — Z8673 Personal history of transient ischemic attack (TIA), and cerebral infarction without residual deficits: Secondary | ICD-10-CM

## 2016-04-19 DIAGNOSIS — Z7902 Long term (current) use of antithrombotics/antiplatelets: Secondary | ICD-10-CM | POA: Diagnosis not present

## 2016-04-19 DIAGNOSIS — E662 Morbid (severe) obesity with alveolar hypoventilation: Secondary | ICD-10-CM | POA: Diagnosis present

## 2016-04-19 DIAGNOSIS — I129 Hypertensive chronic kidney disease with stage 1 through stage 4 chronic kidney disease, or unspecified chronic kidney disease: Secondary | ICD-10-CM | POA: Diagnosis not present

## 2016-04-19 DIAGNOSIS — G894 Chronic pain syndrome: Secondary | ICD-10-CM | POA: Diagnosis present

## 2016-04-19 DIAGNOSIS — E875 Hyperkalemia: Secondary | ICD-10-CM | POA: Diagnosis not present

## 2016-04-19 DIAGNOSIS — Z6841 Body Mass Index (BMI) 40.0 and over, adult: Secondary | ICD-10-CM

## 2016-04-19 DIAGNOSIS — I70221 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, right leg: Secondary | ICD-10-CM | POA: Diagnosis not present

## 2016-04-19 DIAGNOSIS — E785 Hyperlipidemia, unspecified: Secondary | ICD-10-CM | POA: Diagnosis not present

## 2016-04-19 DIAGNOSIS — M79671 Pain in right foot: Secondary | ICD-10-CM | POA: Diagnosis present

## 2016-04-19 DIAGNOSIS — E039 Hypothyroidism, unspecified: Secondary | ICD-10-CM | POA: Diagnosis not present

## 2016-04-19 DIAGNOSIS — F17210 Nicotine dependence, cigarettes, uncomplicated: Secondary | ICD-10-CM | POA: Diagnosis not present

## 2016-04-19 DIAGNOSIS — I70229 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, unspecified extremity: Secondary | ICD-10-CM | POA: Diagnosis present

## 2016-04-19 LAB — URINALYSIS, ROUTINE W REFLEX MICROSCOPIC
Bilirubin Urine: NEGATIVE
Glucose, UA: NEGATIVE mg/dL
Hgb urine dipstick: NEGATIVE
Ketones, ur: NEGATIVE mg/dL
Leukocytes, UA: NEGATIVE
Nitrite: NEGATIVE
Protein, ur: NEGATIVE mg/dL
Specific Gravity, Urine: 1.016 (ref 1.005–1.030)
pH: 5.5 (ref 5.0–8.0)

## 2016-04-19 LAB — CBC
HCT: 38.3 % (ref 36.0–46.0)
Hemoglobin: 11.7 g/dL — ABNORMAL LOW (ref 12.0–15.0)
MCH: 26.7 pg (ref 26.0–34.0)
MCHC: 30.5 g/dL (ref 30.0–36.0)
MCV: 87.4 fL (ref 78.0–100.0)
Platelets: 345 10*3/uL (ref 150–400)
RBC: 4.38 MIL/uL (ref 3.87–5.11)
RDW: 16.8 % — ABNORMAL HIGH (ref 11.5–15.5)
WBC: 12 10*3/uL — ABNORMAL HIGH (ref 4.0–10.5)

## 2016-04-19 LAB — GLUCOSE, CAPILLARY
Glucose-Capillary: 146 mg/dL — ABNORMAL HIGH (ref 65–99)
Glucose-Capillary: 247 mg/dL — ABNORMAL HIGH (ref 65–99)

## 2016-04-19 LAB — COMPREHENSIVE METABOLIC PANEL
ALT: 14 U/L (ref 14–54)
AST: 25 U/L (ref 15–41)
Albumin: 3.2 g/dL — ABNORMAL LOW (ref 3.5–5.0)
Alkaline Phosphatase: 40 U/L (ref 38–126)
Anion gap: 10 (ref 5–15)
BUN: 62 mg/dL — ABNORMAL HIGH (ref 6–20)
CO2: 31 mmol/L (ref 22–32)
Calcium: 9.6 mg/dL (ref 8.9–10.3)
Chloride: 99 mmol/L — ABNORMAL LOW (ref 101–111)
Creatinine, Ser: 2.24 mg/dL — ABNORMAL HIGH (ref 0.44–1.00)
GFR calc Af Amer: 25 mL/min — ABNORMAL LOW (ref >60)
GFR calc non Af Amer: 22 mL/min — ABNORMAL LOW (ref >60)
Glucose, Bld: 165 mg/dL — ABNORMAL HIGH (ref 65–99)
Potassium: 3.8 mmol/L (ref 3.5–5.1)
Sodium: 140 mmol/L (ref 135–145)
Total Bilirubin: 0.4 mg/dL (ref 0.3–1.2)
Total Protein: 6.9 g/dL (ref 6.5–8.1)

## 2016-04-19 LAB — PROTIME-INR
INR: 1.05 (ref 0.00–1.49)
Prothrombin Time: 14 seconds (ref 11.6–15.2)

## 2016-04-19 MED ORDER — POTASSIUM CHLORIDE CRYS ER 20 MEQ PO TBCR
40.0000 meq | EXTENDED_RELEASE_TABLET | Freq: Every day | ORAL | Status: DC
  Administered 2016-04-19 – 2016-04-20 (×2): 40 meq via ORAL
  Filled 2016-04-19 (×2): qty 2

## 2016-04-19 MED ORDER — OXYCODONE-ACETAMINOPHEN 5-325 MG PO TABS
1.0000 | ORAL_TABLET | ORAL | Status: DC | PRN
  Administered 2016-04-20 – 2016-04-21 (×4): 1 via ORAL
  Filled 2016-04-19 (×4): qty 1

## 2016-04-19 MED ORDER — METOPROLOL TARTRATE 5 MG/5ML IV SOLN: 2.0000 mg | INTRAVENOUS | Status: DC | PRN

## 2016-04-19 MED ORDER — HYDRALAZINE HCL 20 MG/ML IJ SOLN: 5.0000 mg | INTRAMUSCULAR | Status: DC | PRN

## 2016-04-19 MED ORDER — LEVOTHYROXINE SODIUM 75 MCG PO TABS
150.0000 ug | ORAL_TABLET | Freq: Every day | ORAL | Status: DC
  Administered 2016-04-20 – 2016-04-21 (×2): 150 ug via ORAL
  Filled 2016-04-19 (×2): qty 2

## 2016-04-19 MED ORDER — ALLOPURINOL 100 MG PO TABS
100.0000 mg | ORAL_TABLET | ORAL | Status: DC
  Administered 2016-04-20: 100 mg via ORAL
  Filled 2016-04-19 (×2): qty 1

## 2016-04-19 MED ORDER — ALUM & MAG HYDROXIDE-SIMETH 200-200-20 MG/5ML PO SUSP: 15.0000 mL | ORAL | Status: DC | PRN

## 2016-04-19 MED ORDER — MORPHINE SULFATE (PF) 2 MG/ML IV SOLN
2.0000 mg | INTRAVENOUS | Status: DC | PRN
  Administered 2016-04-20: 4 mg via INTRAVENOUS
  Filled 2016-04-19: qty 2

## 2016-04-19 MED ORDER — OXYCODONE HCL 5 MG PO TABS
5.0000 mg | ORAL_TABLET | ORAL | Status: DC | PRN
  Administered 2016-04-19 – 2016-04-21 (×9): 5 mg via ORAL
  Filled 2016-04-19 (×9): qty 1

## 2016-04-19 MED ORDER — LORATADINE 10 MG PO TABS
10.0000 mg | ORAL_TABLET | Freq: Every day | ORAL | Status: DC
  Administered 2016-04-20: 10 mg via ORAL
  Filled 2016-04-19 (×2): qty 1

## 2016-04-19 MED ORDER — PHENOL 1.4 % MT LIQD: 1.0000 | OROMUCOSAL | Status: DC | PRN

## 2016-04-19 MED ORDER — SODIUM CHLORIDE 0.45 % IV SOLN
INTRAVENOUS | Status: DC
  Administered 2016-04-19 (×2): via INTRAVENOUS

## 2016-04-19 MED ORDER — ALBUTEROL SULFATE (2.5 MG/3ML) 0.083% IN NEBU
2.5000 mg | INHALATION_SOLUTION | Freq: Four times a day (QID) | RESPIRATORY_TRACT | Status: DC | PRN
Start: 2016-04-19 — End: 2016-04-21
  Administered 2016-04-19 – 2016-04-21 (×3): 2.5 mg via RESPIRATORY_TRACT
  Filled 2016-04-19 (×3): qty 3

## 2016-04-19 MED ORDER — DULAGLUTIDE 0.75 MG/0.5ML ~~LOC~~ SOAJ: 0.7500 mg | SUBCUTANEOUS | Status: DC

## 2016-04-19 MED ORDER — GUAIFENESIN-DM 100-10 MG/5ML PO SYRP: 15.0000 mL | ORAL_SOLUTION | ORAL | Status: DC | PRN

## 2016-04-19 MED ORDER — TIZANIDINE HCL 4 MG PO TABS
4.0000 mg | ORAL_TABLET | Freq: Two times a day (BID) | ORAL | Status: DC | PRN
  Filled 2016-04-19: qty 1

## 2016-04-19 MED ORDER — POTASSIUM CHLORIDE CRYS ER 20 MEQ PO TBCR: 40.0000 meq | EXTENDED_RELEASE_TABLET | Freq: Two times a day (BID) | ORAL | Status: DC

## 2016-04-19 MED ORDER — MIRABEGRON ER 25 MG PO TB24
50.0000 mg | ORAL_TABLET | Freq: Every day | ORAL | Status: DC
  Administered 2016-04-20: 50 mg via ORAL
  Filled 2016-04-19 (×2): qty 2

## 2016-04-19 MED ORDER — POLYVINYL ALCOHOL 1.4 % OP SOLN: 2.0000 [drp] | Freq: Every day | OPHTHALMIC | Status: DC | PRN

## 2016-04-19 MED ORDER — INSULIN ASPART 100 UNIT/ML ~~LOC~~ SOLN
0.0000 [IU] | Freq: Three times a day (TID) | SUBCUTANEOUS | Status: DC
  Administered 2016-04-19: 4 [IU] via SUBCUTANEOUS
  Administered 2016-04-20: 11 [IU] via SUBCUTANEOUS
  Administered 2016-04-20 – 2016-04-21 (×2): 15 [IU] via SUBCUTANEOUS

## 2016-04-19 MED ORDER — OXYCODONE-ACETAMINOPHEN 10-325 MG PO TABS: 1.0000 | ORAL_TABLET | ORAL | Status: DC | PRN

## 2016-04-19 MED ORDER — PANTOPRAZOLE SODIUM 40 MG PO TBEC
80.0000 mg | DELAYED_RELEASE_TABLET | Freq: Every day | ORAL | Status: DC
  Administered 2016-04-20: 80 mg via ORAL
  Filled 2016-04-19: qty 2

## 2016-04-19 MED ORDER — MOMETASONE FURO-FORMOTEROL FUM 200-5 MCG/ACT IN AERO
2.0000 | INHALATION_SPRAY | Freq: Two times a day (BID) | RESPIRATORY_TRACT | Status: DC
  Administered 2016-04-20 – 2016-04-21 (×2): 2 via RESPIRATORY_TRACT
  Filled 2016-04-19: qty 8.8

## 2016-04-19 MED ORDER — LABETALOL HCL 5 MG/ML IV SOLN: 10.0000 mg | INTRAVENOUS | Status: DC | PRN

## 2016-04-19 MED ORDER — TERBINAFINE HCL 1 % EX CREA
1.0000 "application " | TOPICAL_CREAM | Freq: Every day | CUTANEOUS | Status: DC
  Filled 2016-04-19: qty 12

## 2016-04-19 MED ORDER — FLUTICASONE PROPIONATE 50 MCG/ACT NA SUSP: 2.0000 | NASAL | Status: DC | PRN

## 2016-04-19 MED ORDER — FENOFIBRATE 160 MG PO TABS
160.0000 mg | ORAL_TABLET | Freq: Every day | ORAL | Status: DC
  Administered 2016-04-20: 160 mg via ORAL
  Filled 2016-04-19 (×2): qty 1

## 2016-04-19 MED ORDER — ENOXAPARIN SODIUM 40 MG/0.4ML ~~LOC~~ SOLN
40.0000 mg | SUBCUTANEOUS | Status: DC
  Administered 2016-04-20: 40 mg via SUBCUTANEOUS
  Filled 2016-04-19 (×2): qty 0.4

## 2016-04-19 MED ORDER — POTASSIUM CHLORIDE CRYS ER 20 MEQ PO TBCR
80.0000 meq | EXTENDED_RELEASE_TABLET | Freq: Every day | ORAL | Status: DC
  Filled 2016-04-19 (×2): qty 4

## 2016-04-19 MED ORDER — FUROSEMIDE 80 MG PO TABS
120.0000 mg | ORAL_TABLET | Freq: Every day | ORAL | Status: DC
  Administered 2016-04-20: 120 mg via ORAL
  Filled 2016-04-19 (×2): qty 1

## 2016-04-19 MED ORDER — ONDANSETRON HCL 4 MG/2ML IJ SOLN: 4.0000 mg | Freq: Four times a day (QID) | INTRAMUSCULAR | Status: DC | PRN

## 2016-04-19 MED ORDER — NORTRIPTYLINE HCL 10 MG PO CAPS
10.0000 mg | ORAL_CAPSULE | Freq: Every day | ORAL | Status: DC
  Administered 2016-04-19 – 2016-04-20 (×2): 10 mg via ORAL
  Filled 2016-04-19 (×2): qty 1

## 2016-04-20 ENCOUNTER — Encounter (HOSPITAL_COMMUNITY)
Admission: RE | Disposition: A | Discharge status: Home / Self Care | Payer: Self-pay | Source: Ambulatory Visit | Attending: Vascular Surgery

## 2016-04-20 ENCOUNTER — Other Ambulatory Visit: Payer: Self-pay | Admitting: *Deleted

## 2016-04-20 ENCOUNTER — Encounter (HOSPITAL_COMMUNITY): Payer: Self-pay | Admitting: Vascular Surgery

## 2016-04-20 DIAGNOSIS — M79671 Pain in right foot: Secondary | ICD-10-CM | POA: Diagnosis not present

## 2016-04-20 DIAGNOSIS — I739 Peripheral vascular disease, unspecified: Secondary | ICD-10-CM

## 2016-04-20 DIAGNOSIS — I70221 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, right leg: Secondary | ICD-10-CM

## 2016-04-20 HISTORY — PX: PERIPHERAL VASCULAR CATHETERIZATION: SHX172C

## 2016-04-20 HISTORY — PX: LOWER EXTREMITY ANGIOGRAM: SHX5508

## 2016-04-20 LAB — BASIC METABOLIC PANEL
Anion gap: 8 (ref 5–15)
BUN: 66 mg/dL — ABNORMAL HIGH (ref 6–20)
CO2: 30 mmol/L (ref 22–32)
Calcium: 9.2 mg/dL (ref 8.9–10.3)
Chloride: 98 mmol/L — ABNORMAL LOW (ref 101–111)
Creatinine, Ser: 2.28 mg/dL — ABNORMAL HIGH (ref 0.44–1.00)
GFR calc Af Amer: 25 mL/min — ABNORMAL LOW (ref >60)
GFR calc non Af Amer: 21 mL/min — ABNORMAL LOW (ref >60)
Glucose, Bld: 291 mg/dL — ABNORMAL HIGH (ref 65–99)
Potassium: 4.3 mmol/L (ref 3.5–5.1)
Sodium: 136 mmol/L (ref 135–145)

## 2016-04-20 LAB — CBC
HCT: 34.9 % — ABNORMAL LOW (ref 36.0–46.0)
Hemoglobin: 10.6 g/dL — ABNORMAL LOW (ref 12.0–15.0)
MCH: 26.4 pg (ref 26.0–34.0)
MCHC: 30.4 g/dL (ref 30.0–36.0)
MCV: 86.8 fL (ref 78.0–100.0)
Platelets: 345 10*3/uL (ref 150–400)
RBC: 4.02 MIL/uL (ref 3.87–5.11)
RDW: 16.9 % — ABNORMAL HIGH (ref 11.5–15.5)
WBC: 12 10*3/uL — ABNORMAL HIGH (ref 4.0–10.5)

## 2016-04-20 LAB — GLUCOSE, CAPILLARY
Glucose-Capillary: 260 mg/dL — ABNORMAL HIGH (ref 65–99)
Glucose-Capillary: 351 mg/dL — ABNORMAL HIGH (ref 65–99)
Glucose-Capillary: 260 mg/dL — ABNORMAL HIGH (ref 65–99)

## 2016-04-20 LAB — POCT ACTIVATED CLOTTING TIME
Activated Clotting Time: 202 seconds
Activated Clotting Time: 153 seconds
Activated Clotting Time: 257 seconds

## 2016-04-20 SURGERY — ABDOMINAL AORTOGRAM
Anesthesia: LOCAL | Laterality: Right

## 2016-04-20 MED ORDER — PANTOPRAZOLE SODIUM 40 MG PO TBEC: 40.0000 mg | DELAYED_RELEASE_TABLET | Freq: Every day | ORAL | Status: DC

## 2016-04-20 MED ORDER — ONDANSETRON HCL 4 MG/2ML IJ SOLN
INTRAMUSCULAR | Status: AC
  Filled 2016-04-20: qty 2

## 2016-04-20 MED ORDER — HEPARIN SODIUM (PORCINE) 1000 UNIT/ML IJ SOLN
INTRAMUSCULAR | Status: AC
  Filled 2016-04-20: qty 1

## 2016-04-20 MED ORDER — HEPARIN (PORCINE) IN NACL 2-0.9 UNIT/ML-% IJ SOLN
INTRAMUSCULAR | Status: AC
  Filled 2016-04-20: qty 1000

## 2016-04-20 MED ORDER — SODIUM CHLORIDE 0.9 % IV SOLN
INTRAVENOUS | Status: DC | PRN
  Administered 2016-04-20: 500 mL via INTRAVENOUS

## 2016-04-20 MED ORDER — SODIUM CHLORIDE 0.45 % IV SOLN: INTRAVENOUS | Status: DC

## 2016-04-20 MED ORDER — CLOPIDOGREL BISULFATE 300 MG PO TABS
ORAL_TABLET | ORAL | Status: DC | PRN
  Administered 2016-04-20: 75 mg via ORAL

## 2016-04-20 MED ORDER — LIDOCAINE HCL (PF) 1 % IJ SOLN
INTRAMUSCULAR | Status: DC | PRN
  Administered 2016-04-20: 15 mL via SUBCUTANEOUS

## 2016-04-20 MED ORDER — ACETAMINOPHEN 325 MG PO TABS: 325.0000 mg | ORAL_TABLET | ORAL | Status: DC | PRN

## 2016-04-20 MED ORDER — HYDRALAZINE HCL 20 MG/ML IJ SOLN: 5.0000 mg | INTRAMUSCULAR | Status: DC | PRN

## 2016-04-20 MED ORDER — CLOPIDOGREL BISULFATE 75 MG PO TABS
ORAL_TABLET | ORAL | Status: AC
  Filled 2016-04-20: qty 1

## 2016-04-20 MED ORDER — ONDANSETRON HCL 4 MG/2ML IJ SOLN
INTRAMUSCULAR | Status: DC | PRN
  Administered 2016-04-20: 4 mg via INTRAVENOUS

## 2016-04-20 MED ORDER — DIPHENHYDRAMINE HCL 25 MG PO CAPS
25.0000 mg | ORAL_CAPSULE | Freq: Four times a day (QID) | ORAL | Status: DC | PRN
  Administered 2016-04-20: 25 mg via ORAL
  Filled 2016-04-20: qty 1

## 2016-04-20 MED ORDER — HEPARIN SODIUM (PORCINE) 1000 UNIT/ML IJ SOLN
INTRAMUSCULAR | Status: DC | PRN
  Administered 2016-04-20 (×2): 7000 [IU] via INTRAVENOUS
  Administered 2016-04-20: 3000 [IU] via INTRAVENOUS

## 2016-04-20 MED ORDER — MORPHINE SULFATE (PF) 2 MG/ML IV SOLN: 2.0000 mg | INTRAVENOUS | Status: DC | PRN

## 2016-04-20 MED ORDER — CLOPIDOGREL BISULFATE 75 MG PO TABS
75.0000 mg | ORAL_TABLET | ORAL | Status: AC
  Administered 2016-04-20: 75 mg via ORAL

## 2016-04-20 MED ORDER — ONDANSETRON HCL 4 MG/2ML IJ SOLN: 4.0000 mg | Freq: Four times a day (QID) | INTRAMUSCULAR | Status: DC | PRN

## 2016-04-20 MED ORDER — DOCUSATE SODIUM 100 MG PO CAPS
100.0000 mg | ORAL_CAPSULE | Freq: Every day | ORAL | Status: DC
  Filled 2016-04-20: qty 1

## 2016-04-20 MED ORDER — ALUM & MAG HYDROXIDE-SIMETH 200-200-20 MG/5ML PO SUSP: 15.0000 mL | ORAL | Status: DC | PRN

## 2016-04-20 MED ORDER — LIDOCAINE HCL (PF) 1 % IJ SOLN
INTRAMUSCULAR | Status: AC
  Filled 2016-04-20: qty 30

## 2016-04-20 MED ORDER — SODIUM CHLORIDE 0.9 % IV SOLN: 500.0000 mL | Freq: Once | INTRAVENOUS | Status: DC | PRN

## 2016-04-20 MED ORDER — IODIXANOL 320 MG/ML IV SOLN
INTRAVENOUS | Status: DC | PRN
  Administered 2016-04-20: 40 mL via INTRA_ARTERIAL

## 2016-04-20 MED ORDER — HEPARIN (PORCINE) IN NACL 2-0.9 UNIT/ML-% IJ SOLN
INTRAMUSCULAR | Status: DC | PRN
  Administered 2016-04-20: 1000 mL

## 2016-04-20 MED ORDER — ACETAMINOPHEN 325 MG RE SUPP: 325.0000 mg | RECTAL | Status: DC | PRN

## 2016-04-20 SURGICAL SUPPLY — 34 items
BALLN LUTONIX DCB 5X40X130 (BALLOONS) ×3
BALLN MUSTANG 4X150X135 (BALLOONS) ×3
BALLN MUSTANG 5X200X135 (BALLOONS) ×3
BALLOON LUTONIX DCB 5X40X130 (BALLOONS) ×2 IMPLANT
BALLOON MUSTANG 4X150X135 (BALLOONS) ×2 IMPLANT
BALLOON MUSTANG 5X200X135 (BALLOONS) ×2 IMPLANT
CATH CROSS OVER TEMPO 5F (CATHETERS) ×3 IMPLANT
CATH NAVICROSS ANGLED 135CM (MICROCATHETER) ×3 IMPLANT
CATH OMNI FLUSH 5F 65CM (CATHETERS) ×3 IMPLANT
CATH STRAIGHT 5FR 65CM (CATHETERS) ×3 IMPLANT
CATH TEMPO AQUA 5F 100CM (CATHETERS) ×3 IMPLANT
COVER PRB 48X5XTLSCP FOLD TPE (BAG) ×2 IMPLANT
COVER PROBE 5X48 (BAG) ×1
DEVICE CLOSURE PERCLS PRGLD 6F (VASCULAR PRODUCTS) ×2 IMPLANT
FILTER CO2 0.2 MICRON (VASCULAR PRODUCTS) ×3 IMPLANT
GUIDEWIRE ANGLED .035X150CM (WIRE) ×3 IMPLANT
GUIDEWIRE ANGLED .035X260CM (WIRE) ×3 IMPLANT
HOVERMATT SINGLE USE (MISCELLANEOUS) ×3 IMPLANT
KIT ENCORE 26 ADVANTAGE (KITS) ×3 IMPLANT
KIT MICROINTRODUCER STIFF 5F (SHEATH) ×3 IMPLANT
KIT PV (KITS) ×3 IMPLANT
PERCLOSE PROGLIDE 6F (VASCULAR PRODUCTS) ×3
RESERVOIR CO2 (VASCULAR PRODUCTS) ×3 IMPLANT
SET FLUSH CO2 (MISCELLANEOUS) ×3 IMPLANT
SHEATH PINNACLE 5F 10CM (SHEATH) ×3 IMPLANT
SHEATH PINNACLE MP 7F 45CM (SHEATH) ×3 IMPLANT
STENT INNOVA 6X200X130 (Permanent Stent) ×3 IMPLANT
STOPCOCK MORSE 400PSI 3WAY (MISCELLANEOUS) ×3 IMPLANT
SYR MEDRAD MARK V 150ML (SYRINGE) IMPLANT
TRANSDUCER W/STOPCOCK (MISCELLANEOUS) ×3 IMPLANT
TRAY PV CATH (CUSTOM PROCEDURE TRAY) ×3 IMPLANT
TUBING CIL FLEX 10 FLL-RA (TUBING) ×3 IMPLANT
WIRE HITORQ VERSACORE ST 145CM (WIRE) ×6 IMPLANT
WIRE ROSEN-J .035X260CM (WIRE) ×3 IMPLANT

## 2016-04-20 NOTE — Progress Notes (Signed)
Utilization review completed.  

## 2016-04-20 NOTE — Progress Notes (Signed)
Results for [name] [name], [name] [name] (MRN [id number]) as of 04/20/2016 13:45  Ref. Range 04/19/2016 16:38 04/19/2016 21:19 04/20/2016 06:41  Glucose-Capillary Latest Ref Range: 65-99 mg/dL 146 (H) 247 (H) 260 (H)  Patient takes 70/30 insulin at home along with the Trulicity once a week.  Recommend starting at least 1/2 of the home dose of 70/30. Start on 70/30  25 units BID if eating at least 50% of meals and continue Novolog correction scale either MODERATE or RESISTANT TID & HS. Will continue to monitor blood sugars while in the hospital. Harvel Ricks RN BSN CDE

## 2016-04-20 NOTE — H&P (Signed)
Referring Physician: [name], MD  Patient name: [name] Utecht PatrickMRN: [id number] DOB: 10/02/1952Sex: female  REASON FOR CONSULT: Right foot pain  HPI: [name] [name] is a 65 y.o. female that we have seen intermittently over the last several years for peripheral arterial disease. She was last seen about 5 years ago. At that time she had bilateral ABIs in the 0.5 range and had some mild claudication symptoms. Over the last few years she has progressed significantly. She now has pain in her right foot intermittently consistent with rest pain. She also has very short claudication distance right leg worse than the left. Unfortunately despite several smoking cessation counseling session over the last few years she continues to smoke. Greater than 3 minutes were again spent regarding smoking cessation counseling. Other medical problems include diabetes, previous MRSA infection left groin, hypertension, hyperlipidemia, obesity, asthma, sleep apnea. These are all currently stable. She is on Plavix. She is followed by Dr. Florene Glen for some element of renal dysfunction.  Past Medical History  Diagnosis Date  . History of hip replacement, total   . Hx MRSA infection     abscess left groin  . Arthritis   . GERD (gastroesophageal reflux disease)   . Gout   . Diabetes mellitus   . Hypertension   . Thyroid disease   . Hyperlipidemia   . TIA (transient ischemic attack)     X2 NO RESIDUAL PROBLEMS  . Positive cardiac stress test 12/2013    anterior and lateral ischemia on Myoview  . Hx of cardiac cath 06/2014  . Asthma   . Headache   . 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 Cramping of feet   . Stress incontinence   . History of transfusion   . Sleep apnea     UNABLE TO TOLERATE C PAP  . Chronic kidney disease   . Chronic kidney disease   . Anemia   . Bronchitis   . Chronic  pain syndrome   . Morbidly obese ([place])   . Hypothyroid   . Peripheral vascular disease [facility])    Past Surgical History  Procedure Laterality Date  . Thyroidectomy    . Abdominal hysterectomy    . Laparoscopic cholecystectomy    . Forearm fracture surgery      Left arm  . Hernia repair      w/ mesh  . Incision and drainage abscess Left 02/04/2013    Procedure: INCISION AND DRAINAGE LEFT BUTTOCK ABSCESS; INCISION AND DRAINAGE LEFT BREAST ABSCESS; Surgeon: Harl Bowie, MD; Location: [place]; Service: General; Laterality: Left;  . Irrigation and debridement abscess N/A 02/06/2013    Procedure: IRRIGATION AND DEBRIDEMENT BUTTOCK ABSCESS AND DRESSING CHANGE; Surgeon: Harl Bowie, MD; Location: [place]; Service: General; Laterality: N/A;  . Irrigation and debridement abscess Left 02/08/2013    Procedure: IRRIGATION AND DEBRIDEMENT ABSCESS/DRESSING CHANGE; Surgeon: Gwenyth Ober, MD; Location: [place]; Service: General; Laterality: Left;  . Incision and drainage perirectal abscess Left 02/10/2013    Procedure: IRRIGATION AND DEBRIDEMENT OF BUTTOCK/PERINEAL ABSCESS; Surgeon: [name]. Georgette Dover, MD; Location: [place]; Service: General; Laterality: Left;  . Incision and drainage abscess N/A 02/12/2013    Procedure: INCISION AND DEBRIDEMENT BUTTOCK WOUND ; Surgeon: [name]. Georgette Dover, MD; Location: [place]; Service: General; Laterality: N/A;  . Incision and drainage abscess N/A 02/14/2013    Procedure: INCISION AND DRAINAGE/DRESSING CHANGE; Surgeon: Harl Bowie, MD; Location: [place]; Service: General; Laterality: N/A;  . Incision and drainage perirectal abscess  N/A 02/16/2013    Procedure: IRRIGATION AND DEBRIDEMENT PERINEAL ABSCESS; Surgeon: Zenovia Jarred, MD; Location: [place]; Service: General; Laterality: N/A;  . Joint replacement  2010 / 2012   . Colon surgery  1995    DUE  TO POLYP  . Total hip revision Right 08/11/2014    Procedure: RIGHT ACETABULAR REVISION; Surgeon: Gearlean Alf, MD; [facility]; Service: Orthopedics; Laterality: Right;  . Left heart catheterization with coronary angiogram N/A 07/02/2014    Procedure: LEFT HEART CATHETERIZATION WITH CORONARY ANGIOGRAM; Surgeon: Lorretta Harp, MD; Location: [facility]; Service: Cardiovascular; Laterality: N/A;  . Incision and drainage abscess N/A 03/21/2015    Procedure: INCISION AND DRAINAGE PUBIC ABSCESS; Surgeon: Excell Seltzer, MD; [facility]; Service: General; Laterality: N/A;    Family History  Problem Relation Age of Onset  . Diabetes Brother   . Cardiomyopathy Mother   . Heart disease Mother   . Cancer Father     SOCIAL HISTORY: Social History   Social History  . Marital Status: Married    Spouse Name: N/A  . Number of Children: 1  . Years of Education: college   Occupational History  .      Disabled   Social History Main Topics  . Smoking status: Current Every Day Smoker -- 0.50 packs/day for 40 years    Types: Cigarettes  . Smokeless tobacco: Never Used  . Alcohol Use: 0.6 oz/week    1 Standard drinks or equivalent per week     Comment: OCC  . Drug Use: No     Comment: Quit four years ago.  Marland Kitchen Sexual Activity: Not on file   Other Topics Concern  . Not on file   Social History Narrative   Patient is se prated and lives at home with her friend. Patient is disabled.   Education one year of college   Right handed   Caffeine two cups daily.          Allergies  Allergen Reactions  . Ace Inhibitors     Tongue swell  . Bystolic [Nebivolol Hcl] Swelling and Other (See Comments)    Chest pain   . Morphine And Related Itching    Current Outpatient Prescriptions  Medication Sig Dispense Refill    . acetaminophen (TYLENOL) 325 MG tablet Take 2 tablets (650 mg total) by mouth every 6 (six) hours as needed for mild pain (or Fever >/= 101). 30 tablet 0  . albuterol (PROVENTIL) (2.5 MG/3ML) 0.083% nebulizer solution Take 2.5 mg by nebulization every 6 (six) hours as needed for wheezing or shortness of breath.    . allopurinol (ZYLOPRIM) 100 MG tablet Take 100 mg by mouth every morning.     . B Complex Vitamins (VITAMIN B COMPLEX PO) Take 1 tablet by mouth 2 (two) times daily.     . budesonide-formoterol (SYMBICORT) 160-4.5 MCG/ACT inhaler Inhale 2 puffs into the lungs 2 (two) times daily.    . cetirizine (ZYRTEC) 10 MG tablet Take 1 tablet by mouth daily as needed. allergies    . clopidogrel (PLAVIX) 75 MG tablet TAKE 1 TABLET EVERY DAY 90 tablet 1  . diclofenac sodium (VOLTAREN) 1 % GEL Apply 4 g topically 4 (four) times daily.    . Dulaglutide (TRULICITY) 5.78 IO/9.6EX SOPN Inject 0.75 mg into the skin.    Marland Kitchen esomeprazole (NEXIUM) 40 MG capsule Take 40 mg by mouth daily at 12 noon.    . fenofibrate (TRICOR) 145 MG tablet TAKE 1 TABLET (  145 MG TOTAL) BY MOUTH DAILY. 90 tablet 2  . fluticasone (FLONASE) 50 MCG/ACT nasal spray Place 2 sprays into the nose as needed for allergies. Reported on 02/14/2016    . furosemide (LASIX) 40 MG tablet Take 40 mg by mouth daily.    . Garcinia Cambogia-Chromium 500-200 MG-MCG TABS Take 1,600 mg by mouth daily.    . insulin aspart protamine- aspart (NOVOLOG MIX 70/30) (70-30) 100 UNIT/ML injection Inject 30-40 Units into the skin 2 (two) times daily with a meal. Take 40 units in the AM and 30 units at Night    . levothyroxine (SYNTHROID, LEVOTHROID) 137 MCG tablet Take 150 mcg by mouth daily before breakfast.     . losartan (COZAAR) 100 MG tablet Take 1 tablet (100 mg total) by mouth every morning. 90 tablet 3  . mirabegron ER (MYRBETRIQ) 50 MG TB24 tablet Take 50 mg by mouth  daily.    . naftifine (NAFTIN) 1 % cream APPLY TO THE AFFECTED AREA ON SKIN EVERY DAY 60 g 0  . nortriptyline (PAMELOR) 10 MG capsule Take 10 mg by mouth at bedtime.    . ONE TOUCH ULTRA TEST test strip 1 each by Other route 3 (three) times daily as needed (testing blood sugar.).     Marland Kitchen oxyCODONE-acetaminophen (PERCOCET/ROXICET) 5-325 MG per tablet Take 1 tablet by mouth every 6 (six) hours as needed for moderate pain or severe pain.   0  . polyvinyl alcohol (LIQUIFILM TEARS) 1.4 % ophthalmic solution Place 2 drops into both eyes daily as needed for dry eyes.    . potassium chloride SA (K-DUR,KLOR-CON) 20 MEQ tablet Take 20 mEq by mouth 2 (two) times daily.    Marland Kitchen tiZANidine (ZANAFLEX) 4 MG capsule Take 4 mg by mouth 3 (three) times daily.    . Azilsartan Medoxomil (EDARBI) 40 MG TABS Take 40 mg by mouth daily. Reported on 03/08/2016    . BYSTOLIC 10 MG tablet Take 10 mg by mouth daily. Reported on 03/08/2016  2  . doxycycline (ADOXA) 100 MG tablet Take 1 tablet (100 mg total) by mouth 2 (two) times daily. (Patient not taking: Reported on 10/12/2015) 24 tablet 0  . VESICARE 10 MG tablet Take 10 mg by mouth every morning. Reported on 03/08/2016      Filed Vitals:   04/19/16 1143 04/19/16 1948 04/19/16 2156 04/20/16 0421  BP: 150/76 165/81  143/57  Pulse: 98 99 96 102  Temp:  97.9 F (36.6 C)  98.2 F (36.8 C)  TempSrc:  Oral  Oral  Resp:  '20 20 20  '$ Height: 5' 4.5" (1.638 m)     Weight: 319 lb 14.4 oz (145.106 kg)     SpO2: 93% 93% 97% 93%    Body mass index is 52.41 kg/(m^2).  General: Alert and oriented, no acute distress HEENT: Normal Neck: No bruit or JVD Pulmonary: Clear to auscultation bilaterally Cardiac: Regular Rate and Rhythm without murmur Abdomen: Soft, non-tender, non-distended, no mass, no scars Skin: No rash Extremity Pulses: 2+ radial, brachial, femoral, Absent dorsalis pedis, posterior tibial pulses  bilaterally Musculoskeletal: No deformity trace right ankle edema dependent rubor right foot no ulcers Neurologic: Upper and lower extremity motor 5/5 and symmetric  DATA:  [name] [name] bilateral ABIs were 0.3 on the right 0.57 on the left monophasic waveforms bilaterally this is a significant decline of about 50% on both sides from January 2013.  ASSESSMENT: Worsening peripheral arterial disease right foot now threatened   PLAN: Aortogram bilateral lower extremity runoff  possible intervention scheduled for June 9. Risks benefits possible palpitations and procedure details were discussed the patient today including but not limited to bleeding infection and much higher risk of this due to her obesity. Also possible contrast reaction or vessel injury. We will need to check her creatinine preprocedure to make sure that it is reasonable prior to giving her contrast load. The patient would not be a candidate for open revascularization due to her severe obesity and high risk of wound complications. Hopefully a percutaneous option will exist for her. It was again strongly emphasized to the patient today to quit smoking.   [name], MD [facility] [place] Office: [phone] Pager: [phone]

## 2016-04-20 NOTE — Op Note (Signed)
Procedure: Abdominal aortogram with right lower extremity runoff, right superficial femoral artery angioplasty, right superficial femoral artery/popliteal artery stent  Preoperative diagnosis: Rest pain right foot  Postoperative diagnosis: Same  Anesthesia: Local  Operative findings: #1 patent aortoiliac system                                #2  80% stenosis origin right superficial femoral artery angioplasty with Lutonix drug coated balloon to 0 residual stenosis                                 #3 right distal superficial femoral artery above-knee popliteal artery occlusion recanalized and stented with a 6 x 200 Innova self-expanding stent                                #4 total contrast 40 cc  Operative details: After obtaining informed consent, the patient was taken the PV lab. The patient was placed in supine position the Angio table. Both groins were prepped and draped in usual sterile fashion. Local anesthesia was demonstrated of the left common femoral artery. Ultrasound was used to identify the left common femoral artery as well as the femoral bifurcation. A micropuncture needle was then used to cannulate the left common femoral artery under ultrasound guidance. A micropunch or wire was advanced through this. The micropuncture sheath was placed over this. The micropuncture sheath dilator and guidewire were removed and an 035 versacore wire advanced up into the abdominal aorta under fluoroscopic guidance. The micropuncture sheath was removed and exchanged for a 5 French short sheath and this was thoroughly flushed with heparinized saline. Next a 5 French Omni Flush catheter was advanced over the guidewire up in the abdominal aorta. An abdominal aortogram was then obtained in AP projection using carbon dioxide contrast. The infrarenal abdominal aorta is patent. The left and right renal arteries are patent. The left and right common external and internal iliac arteries are all widely patent. Next  the Omni Flush catheter was removed over a guidewire and exchanged for a 5 [place] crossover catheter. This was used to slightly catheterized the right common iliac artery. The versacore wire was then advanced down in the distal right external iliac artery and a crossover catheter exchange for a 5 French straight catheter. Contrast angiogram using again carbon dioxide was then used to do a right lower extremity runoff film. The right common femoral artery is patent. There is a long common femoral artery with the femoral bifurcation well below the ischial bone. The profunda is widely patent. The right superficial femoral artery has an 80% stenosis at the origin and is diffusely diseased throughout its course. The distal superficial femoral artery occludes and the popliteal artery does reconstitute via SFA collaterals and profunda collaterals. Using CO2 contrast it appeared that the patient did have patent runoff at least by the posterior tibial artery to the foot. However, the CO2 contrast did not give precise views I felt I needed to use regular eye and a contrast at this point to get good views to perform an intervention on the right lower extremity. The patient was given 7000 units of intravenous heparin. She was then given an additional 3000 units and an additional 7000 units of heparin during the course of the case. ACT was confirmed to  be greater than 200 during the entirety of intervention. The 5 French sheath was then exchanged for a 7 [place] Pinnacle sheath and this was advanced up and over the aortic bifurcation to the distal right external iliac artery using fluoroscopic guidance. I then advanced a a Nevi cross catheter down into the right superficial femoral artery and attempted to reflux a Glidewire to recanalize the artery however there was a large collateral that the guidewire kept advancing into. Therefore I removed the Nevi cross catheter and exchange this for a 5 French straight catheter. I was able  to advance this down to the level of the occlusion and then reflux the wire on itself and then I was able to gain reentry into the popliteal artery. This was confirmed with contrast angiogram. I then proceeded to predilate the entire occluded region with a 4 x 1 50 balloon inflated to 10 atm for 1 minute. Contrast angiogram was then again performed to determine entire length that would need to be stented. A 6 x 200 I know the self-expanding stent was brought up in the operative field and this was advanced to the popliteal artery at the level of the patella and then extended proximally. It was then postdilated with a 5 x 200 balloon to 10 atm for 1 minute. Completion angiogram of this showed no dissection I then brought up a 5 x 40 L on X balloon and this drug coated balloon was advanced to the level of the origin of the right superficial femoral artery. This was inflated at this area to 6 atm for 3 minutes. This was then deflated and advanced down to the proximal level of the stent and reinflated to 6 atm for 30 seconds just to make sure that we had the proximal aspect of the stent fully deployed. Completion arteriogram was then performed which showed residual less than 20% proximal SFA stenosis a widely patent 0 residual percent stenosis of the distal SFA and popliteal artery above the knee through the stent. There was runoff via the proximal anterior tibial posterior tibial and peroneal arteries. The posterior tibial artery however was the primary runoff vessel to the foot. At this point the guidewire and sheath were pulled down and the left hemipelvis. The guidewire was then advanced up into the abdominal aorta. A 7 French sheath was removed and a 6 [place] Proglide advanced over the guidewire into the left common femoral artery. The guidewire was then removed and the Proglide device deployed. The sutures were then retrieved and cinched down. There was good hemostasis. Direct pressure was then held for  approximately 10 minutes. The patient did briefly experience a vagal episode with break brief systolic pressure in the 34H. This recovered with a fluid bolus she also had some nausea which also resolved as her blood pressure improved. The patient was then taken to the holding area in stable condition.  [name], MD [facility] [place] [facility] Office: [phone] Pager: [phone]

## 2016-04-21 DIAGNOSIS — I70221 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, right leg: Secondary | ICD-10-CM | POA: Diagnosis not present

## 2016-04-21 DIAGNOSIS — M79671 Pain in right foot: Secondary | ICD-10-CM | POA: Diagnosis not present

## 2016-04-21 LAB — CBC
HCT: 36.4 % (ref 36.0–46.0)
Hemoglobin: 10.8 g/dL — ABNORMAL LOW (ref 12.0–15.0)
MCH: 26.5 pg (ref 26.0–34.0)
MCHC: 29.7 g/dL — ABNORMAL LOW (ref 30.0–36.0)
MCV: 89.4 fL (ref 78.0–100.0)
Platelets: 315 10*3/uL (ref 150–400)
RBC: 4.07 MIL/uL (ref 3.87–5.11)
RDW: 16.8 % — ABNORMAL HIGH (ref 11.5–15.5)
WBC: 14.6 10*3/uL — ABNORMAL HIGH (ref 4.0–10.5)

## 2016-04-21 LAB — BASIC METABOLIC PANEL
Anion gap: 12 (ref 5–15)
BUN: 60 mg/dL — ABNORMAL HIGH (ref 6–20)
CO2: 27 mmol/L (ref 22–32)
Calcium: 9.2 mg/dL (ref 8.9–10.3)
Chloride: 97 mmol/L — ABNORMAL LOW (ref 101–111)
Creatinine, Ser: 2.47 mg/dL — ABNORMAL HIGH (ref 0.44–1.00)
GFR calc Af Amer: 22 mL/min — ABNORMAL LOW (ref >60)
GFR calc non Af Amer: 19 mL/min — ABNORMAL LOW (ref >60)
Glucose, Bld: 345 mg/dL — ABNORMAL HIGH (ref 65–99)
Potassium: 5.3 mmol/L — ABNORMAL HIGH (ref 3.5–5.1)
Sodium: 136 mmol/L (ref 135–145)

## 2016-04-21 LAB — GLUCOSE, CAPILLARY: Glucose-Capillary: 344 mg/dL — ABNORMAL HIGH (ref 65–99)

## 2016-04-21 MED ORDER — POTASSIUM CHLORIDE CRYS ER 20 MEQ PO TBCR: 80.0000 meq | EXTENDED_RELEASE_TABLET | Freq: Every day | ORAL | Status: DC

## 2016-04-21 MED ORDER — INSULIN ASPART PROT & ASPART (70-30 MIX) 100 UNIT/ML ~~LOC~~ SUSP
25.0000 [IU] | Freq: Two times a day (BID) | SUBCUTANEOUS | Status: DC
  Filled 2016-04-21: qty 10

## 2016-04-21 MED ORDER — CLOPIDOGREL BISULFATE 75 MG PO TABS: 75.0000 mg | ORAL_TABLET | Freq: Every day | ORAL | Status: DC

## 2016-04-21 MED ORDER — ALBUTEROL SULFATE (2.5 MG/3ML) 0.083% IN NEBU
2.5000 mg | INHALATION_SOLUTION | Freq: Three times a day (TID) | RESPIRATORY_TRACT | Status: DC
  Administered 2016-04-21: 2.5 mg via RESPIRATORY_TRACT
  Filled 2016-04-21: qty 3

## 2016-04-21 MED ORDER — POTASSIUM CHLORIDE CRYS ER 20 MEQ PO TBCR: 40.0000 meq | EXTENDED_RELEASE_TABLET | Freq: Every day | ORAL | Status: DC

## 2016-04-21 NOTE — Progress Notes (Addendum)
  [facility] Progress Note  Subjective  - POD #1  Complaining of swelling right foot. Says that this has been present for the past several months. Denies chest discomfort, shortness of breath.   Objective Filed Vitals:   04/20/16 2048 04/21/16 0614  BP: 136/65 122/53  Pulse: 74 98  Temp: 98.2 F (36.8 C) 97.6 F (36.4 C)  Resp: 18 18    Intake/Output Summary (Last 24 hours) at 04/21/16 0833 Last data filed at 04/21/16 0530  Gross per 24 hour  Intake      0 ml  Output   1500 ml  Net  -1500 ml   Left groin without hematoma.  Lungs clear RRR Right foot swollen.    Assessment/Planning: 65 y.o. female is s/p: abdominal aortogram with right lower extremity runoff, right superficial femoral artery angioplasty, right superficial femoral artery/popliteal artery stent for rest pain right foot.  1 Day Post-Op   Left groin without hematoma. Plavix started today.  CKD: Creatinine elevated at 2.47. Was 2.28 yesterday. Previous arteriogram canceled due to elevated creatinine. Total contrast 40 cc. Good UOP.  Hyperkalemia: 5.3 today. Currently sinus tachy (low 100s). Will monitor.  Will monitor renal function and potassium today. D/c in am if labs improve.     Alvia Grove 04/21/2016 8:33 AM --  Laboratory CBC    Component Value Date/Time   WBC 14.6* 04/21/2016 0551   WBC 13.2* 12/23/2013 1132   HGB 10.8* 04/21/2016 0551   HCT 36.4 04/21/2016 0551   PLT 315 04/21/2016 0551    BMET    Component Value Date/Time   NA 136 04/21/2016 0551   NA 146* 12/23/2013 1132   K 5.3* 04/21/2016 0551   CL 97* 04/21/2016 0551   CO2 27 04/21/2016 0551   GLUCOSE 345* 04/21/2016 0551   GLUCOSE 86 12/23/2013 1132   BUN 60* 04/21/2016 0551   BUN 31* 12/23/2013 1132   CREATININE 2.47* 04/21/2016 0551   CREATININE 1.32* 06/29/2014 1100   CALCIUM 9.2 04/21/2016 0551   GFRNONAA 19* 04/21/2016 0551   GFRAA 22* 04/21/2016 0551    COAG Lab Results  Component  Value Date   INR 1.05 04/19/2016   INR 1.09 03/22/2015   INR 1.04 08/04/2014   No results found for: PTT  Antibiotics Anti-infectives    None       Virgina Jock, PA-C [facility] Office: [phone] Pager: [phone] 04/21/2016 8:33 AM  Addendum  I have independently interviewed and examined the patient, and I agree with the physician assistant's findings.  No access complication.  Ok to D/C  Adele Barthel, MD [facility] [place] Office: [phone] Pager: [phone]  04/21/2016, 10:02 AM

## 2016-04-21 NOTE — Progress Notes (Signed)
Pt given discharge instructions, medication lists, follow up appointments, and when to call the doctor.  Pt verbalizes understanding. Pt given signs and symptoms of infection.

## 2016-04-21 NOTE — Progress Notes (Signed)
Patient refused all daily medications.  She states she will take when she gets home. Payton Emerald, RN

## 2016-04-23 ENCOUNTER — Telehealth: Payer: Self-pay | Admitting: Vascular Surgery

## 2016-04-23 MED FILL — Clopidogrel Bisulfate Tab 75 MG (Base Equiv): ORAL | Qty: 1 | Status: AC

## 2016-04-23 NOTE — Telephone Encounter (Signed)
Sched appt 8/10; labs at 10:00 and MD at 1:00. Spoke to pt to inform them of appt.

## 2016-04-23 NOTE — Telephone Encounter (Signed)
-----   Message from Mena Goes, RN sent at 04/20/2016  1:18 PM EDT ----- Regarding: post angio, needs LE duplex and ABIs    ----- Message -----    From: Elam Dutch, MD    Sent: 04/20/2016  11:59 AM      To: [facility] Charge Pool  [name] groin Right SFA drug balloon PTA of proximal SFA Right popliteal artery stent  She needs follow up duplex and ABIs and appt with me in 1 month  [name]

## 2016-04-26 NOTE — Discharge Summary (Signed)
[facility] Discharge Summary  [name] [name] 06-23-1951 65 y.o. female  [id number]  Admission Date: 04/19/2016  Discharge Date: 04/20/2016  Physician: [name], MD  Admission Diagnosis: pvd  HPI:   This is a 65 y.o. female that we have seen intermittently over the last several years for peripheral arterial disease. She was last seen about 5 years ago. At that time she had bilateral ABIs in the 0.5 range and had some mild claudication symptoms. Over the last few years she has progressed significantly. She now has pain in her right foot intermittently consistent with rest pain. She also has very short claudication distance right leg worse than the left. Unfortunately despite several smoking cessation counseling session over the last few years she continues to smoke. Greater than 3 minutes were again spent regarding smoking cessation counseling. Other medical problems include diabetes, previous MRSA infection left groin, hypertension, hyperlipidemia, obesity, asthma, sleep apnea. These are all currently stable. She is on Plavix. She is followed by Dr. Florene Glen for some element of renal dysfunction.  Hospital Course:  The patient was admitted to the hospital and taken to the operating room on 04/19/2016 and underwent abdominal aortogram with right lower extremity runoff, right superficial femoral artery angioplasty, right superficial femoral artery/popliteal artery stent.  Operative findings: #1 patent aortoiliac system #2 80% stenosis origin right superficial femoral artery angioplasty with Lutonix drug coated balloon to 0 residual stenosis  #3 right distal superficial femoral artery above-knee popliteal artery occlusion recanalized and stented with a 6 x 200 Innova self-expanding stent #4 total contrast 40 cc  The patient tolerated the procedure well and was transported to the PACU in good condition.   The patient had no access site complications post-op. She  had mild hyperkalemia but was asymptomatic. Her serum creatinine was minimally elevated from her baseline. She was discharged home on POD 1 in good condition.      CBC    Component Value Date/Time   WBC 14.6* 04/21/2016 0551   WBC 13.2* 12/23/2013 1132   RBC 4.07 04/21/2016 0551   RBC 4.68 12/23/2013 1132   HGB 10.8* 04/21/2016 0551   HCT 36.4 04/21/2016 0551   PLT 315 04/21/2016 0551   MCV 89.4 04/21/2016 0551   MCH 26.5 04/21/2016 0551   MCH 25.6* 12/23/2013 1132   MCHC 29.7* 04/21/2016 0551   MCHC 33.0 12/23/2013 1132   RDW 16.8* 04/21/2016 0551   RDW 15.7* 12/23/2013 1132   LYMPHSABS 2.3 04/08/2015 1333   LYMPHSABS 2.5 12/23/2013 1132   MONOABS 0.6 04/08/2015 1333   EOSABS 0.1 04/08/2015 1333   EOSABS 0.1 12/23/2013 1132   BASOSABS 0.0 04/08/2015 1333   BASOSABS 0.0 12/23/2013 1132    BMET    Component Value Date/Time   NA 136 04/21/2016 0551   NA 146* 12/23/2013 1132   K 5.3* 04/21/2016 0551   CL 97* 04/21/2016 0551   CO2 27 04/21/2016 0551   GLUCOSE 345* 04/21/2016 0551   GLUCOSE 86 12/23/2013 1132   BUN 60* 04/21/2016 0551   BUN 31* 12/23/2013 1132   CREATININE 2.47* 04/21/2016 0551   CREATININE 1.32* 06/29/2014 1100   CALCIUM 9.2 04/21/2016 0551   GFRNONAA 19* 04/21/2016 0551   GFRAA 22* 04/21/2016 0551     Discharge Instructions:   The patient is discharged to home with extensive instructions on wound care and progressive ambulation.  They are instructed not to drive or perform any heavy lifting until returning to see the physician in his  office.  Discharge Instructions    Call MD for:  redness, tenderness, or signs of infection (pain, swelling, bleeding, redness, odor or green/yellow discharge around incision site)    Complete by:  As directed      Call MD for:  severe or increased pain, loss or decreased feeling  in affected limb(s)    Complete by:  As directed      Call MD for:  temperature >100.5    Complete by:  As directed      Discharge  wound care:    Complete by:  As directed   Wash left groin with soap and water and pat dry for 3 days. You do not have to reapply a dressing.     Driving Restrictions    Complete by:  As directed   No driving for 1 week     Increase activity slowly    Complete by:  As directed   Keep right leg elevated above level of the heart to minimize swelling.     Lifting restrictions    Complete by:  As directed   No lifting for 1 week     Resume previous diet    Complete by:  As directed            Discharge Diagnosis:  pvd  Secondary Diagnosis: Patient Active Problem List   Diagnosis Date Noted  . 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 Cellulitis of abdominal wall 03/21/2015  . Acute on chronic renal failure ([place]) 03/21/2015  . Type 2 diabetes mellitus ([place]) 03/21/2015  . Abdominal wall abscess 03/21/2015  . 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 Low urine output 08/11/2014  . Chronic systolic CHF (congestive heart failure) ([place]) 08/11/2014  . Asthma, chronic 08/11/2014  . Hypothyroidism 08/11/2014  . OSA (obstructive sleep apnea) 08/11/2014  . Positive cardiac stress test 06/29/2014  . Hyperlipidemia 02/12/2014  . Left arm weakness 12/23/2013  . TIA (transient ischemic attack) 12/23/2013  . Left buttock abscess 10/12/2013  . Cellulitis and abscess of leg, right 04/16/2013  . Acute blood loss anemia 02/14/2013  . HTN (hypertension) 02/14/2013  . Cellulitis and abscess of buttock 02/09/2013  . Morbid obesity ([place]) 02/03/2013  . ARF (acute renal failure) ([place]) 02/03/2013  . Diarrhea 02/03/2013  . Hyponatremia 02/03/2013  . Hypokalemia 02/03/2013  . Sepsis ([place]) 02/02/2013  . DM (diabetes mellitus) ([place]) 02/02/2013  . CKD (chronic kidney disease), stage III 02/02/2013  . Cellulitis 02/02/2013  . PAD (peripheral artery disease) ([place]) 04/26/2011  . Tobacco abuse 04/26/2011   Past Medical History    Diagnosis Date  . History of hip replacement, total   . Hx MRSA infection     abscess left groin  . Arthritis   . GERD (gastroesophageal reflux disease)   . Gout   . Diabetes mellitus   . Hypertension   . Thyroid disease   . Hyperlipidemia   . TIA (transient ischemic attack)      X2 NO RESIDUAL PROBLEMS  . Positive cardiac stress test 12/2013    anterior and lateral ischemia on Myoview  . Hx of cardiac cath 06/2014  . Asthma   . Headache   . 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 Cramping of feet   . Stress incontinence   . History of transfusion   . Sleep apnea     UNABLE TO TOLERATE C PAP  . Chronic kidney disease   .  Chronic kidney disease   . Anemia   . Bronchitis   . Chronic pain syndrome   . Morbidly obese ([place])   . Hypothyroid   . Peripheral vascular disease ([place])        Medication List    TAKE these medications        albuterol (2.5 MG/3ML) 0.083% nebulizer solution  Commonly known as:  PROVENTIL  Take 2.5 mg by nebulization every 6 (six) hours as needed for wheezing or shortness of breath.     allopurinol 100 MG tablet  Commonly known as:  ZYLOPRIM  Take 100 mg by mouth every morning.     budesonide-formoterol 160-4.5 MCG/ACT inhaler  Commonly known as:  SYMBICORT  Inhale 2 puffs into the lungs 2 (two) times daily as needed (for SOB).     cetirizine 10 MG tablet  Commonly known as:  ZYRTEC  Take 10 mg by mouth daily as needed for allergies.     clopidogrel 75 MG tablet  Commonly known as:  PLAVIX  Take 1 tablet (75 mg total) by mouth daily.     diazepam 5 MG tablet  Commonly known as:  VALIUM  Take 5 mg by mouth daily as needed.     esomeprazole 40 MG capsule  Commonly known as:  NEXIUM  Take 40 mg by mouth daily.     fenofibrate 145 MG tablet  Commonly known as:  TRICOR  TAKE 1 TABLET (145 MG TOTAL) BY MOUTH DAILY.     fluticasone 50 MCG/ACT nasal spray  Commonly known as:  FLONASE  Place 2 sprays into the nose as needed for  allergies.     furosemide 40 MG tablet  Commonly known as:  LASIX  Take 180 mg by mouth 2 (two) times daily.     furosemide 40 MG tablet  Commonly known as:  LASIX  Take 120 mg by mouth daily.     insulin aspart protamine- aspart (70-30) 100 UNIT/ML injection  Commonly known as:  NOVOLOG MIX 70/30  Inject 50 Units into the skin 2 (two) times daily with a meal.     levothyroxine 150 MCG tablet  Commonly known as:  SYNTHROID, LEVOTHROID  Take 150 mcg by mouth daily before breakfast.     multivitamin with minerals Tabs tablet  Take 1 tablet by mouth daily.     MYRBETRIQ 50 MG Tb24 tablet  Generic drug:  mirabegron ER  Take 50 mg by mouth daily.     naftifine 1 % cream  Commonly known as:  NAFTIN  APPLY TO THE AFFECTED AREA ON SKIN EVERY DAY     nortriptyline 10 MG capsule  Commonly known as:  PAMELOR  Take 10 mg by mouth at bedtime.     oxyCODONE-acetaminophen 10-325 MG tablet  Commonly known as:  PERCOCET  Take 1 tablet by mouth every 4 (four) hours as needed for pain.     polyvinyl alcohol 1.4 % ophthalmic solution  Commonly known as:  LIQUIFILM TEARS  Place 2 drops into both eyes daily as needed for dry eyes.     potassium chloride SA 20 MEQ tablet  Commonly known as:  K-DUR,KLOR-CON  Take 40 mEq by mouth 2 (two) times daily.     tiZANidine 4 MG capsule  Commonly known as:  ZANAFLEX  Take 4 mg by mouth 2 (two) times daily.     TRULICITY 8.36 OQ/9.4TM Sopn  Generic drug:  Dulaglutide  Inject 0.75 mg into the skin every Monday.  VITAMIN B COMPLEX PO  Take 1 tablet by mouth 2 (two) times daily.        Disposition: Home  Patient's condition: is Good  Follow up: 1. Dr. Oneida Alar in 4 weeks   Virgina Jock, PA-C [facility] [phone] 04/26/2016  9:36 AM

## 2016-05-03 ENCOUNTER — Encounter (HOSPITAL_COMMUNITY): Payer: Medicare Other

## 2016-05-03 ENCOUNTER — Ambulatory Visit: Payer: Medicare Other | Admitting: Vascular Surgery

## 2016-05-10 ENCOUNTER — Encounter: Payer: Self-pay | Admitting: Vascular Surgery

## 2016-05-17 ENCOUNTER — Ambulatory Visit (INDEPENDENT_AMBULATORY_CARE_PROVIDER_SITE_OTHER): Payer: Medicare Other | Admitting: Vascular Surgery

## 2016-05-17 ENCOUNTER — Ambulatory Visit (HOSPITAL_COMMUNITY)
Admission: RE | Admit: 2016-05-17 | Discharge: 2016-05-17 | Disposition: A | Discharge status: Home / Self Care | Payer: Medicare Other | Source: Ambulatory Visit | Attending: Vascular Surgery | Admitting: Vascular Surgery

## 2016-05-17 ENCOUNTER — Encounter: Payer: Self-pay | Admitting: Vascular Surgery

## 2016-05-17 ENCOUNTER — Ambulatory Visit (INDEPENDENT_AMBULATORY_CARE_PROVIDER_SITE_OTHER)
Admission: RE | Admit: 2016-05-17 | Discharge: 2016-05-17 | Disposition: A | Discharge status: Home / Self Care | Payer: Medicare Other | Source: Ambulatory Visit | Attending: Vascular Surgery | Admitting: Vascular Surgery

## 2016-05-17 VITALS — BP 124/86 | HR 76 | Temp 97.8°F | Resp 18 | Ht 64.5 in | Wt 320.0 lb

## 2016-05-17 DIAGNOSIS — I739 Peripheral vascular disease, unspecified: Secondary | ICD-10-CM

## 2016-05-17 DIAGNOSIS — I129 Hypertensive chronic kidney disease with stage 1 through stage 4 chronic kidney disease, or unspecified chronic kidney disease: Secondary | ICD-10-CM | POA: Insufficient documentation

## 2016-05-17 DIAGNOSIS — E1122 Type 2 diabetes mellitus with diabetic chronic kidney disease: Secondary | ICD-10-CM | POA: Insufficient documentation

## 2016-05-17 DIAGNOSIS — K219 Gastro-esophageal reflux disease without esophagitis: Secondary | ICD-10-CM | POA: Insufficient documentation

## 2016-05-17 DIAGNOSIS — G4730 Sleep apnea, unspecified: Secondary | ICD-10-CM | POA: Diagnosis not present

## 2016-05-17 DIAGNOSIS — E785 Hyperlipidemia, unspecified: Secondary | ICD-10-CM | POA: Insufficient documentation

## 2016-05-17 DIAGNOSIS — N189 Chronic kidney disease, unspecified: Secondary | ICD-10-CM | POA: Insufficient documentation

## 2016-05-17 DIAGNOSIS — E039 Hypothyroidism, unspecified: Secondary | ICD-10-CM | POA: Diagnosis not present

## 2016-05-17 DIAGNOSIS — E1152 Type 2 diabetes mellitus with diabetic peripheral angiopathy with gangrene: Secondary | ICD-10-CM | POA: Diagnosis not present

## 2016-05-17 DIAGNOSIS — R0989 Other specified symptoms and signs involving the circulatory and respiratory systems: Secondary | ICD-10-CM | POA: Diagnosis present

## 2016-05-17 NOTE — Progress Notes (Signed)
Referring Physician: [name], MD   Patient name: [name] [name]        MRN: [id number]       DOB: 1951-09-03           Sex: female   REASON FOR CONSULT: Right foot pain   HPI: [name] [name] is a 65 y.o. female that we have seen intermittently over the last several years for peripheral arterial disease. She recently underwent angioplasty with a drug-eluting balloon of her right superficial femoral artery as well as distal right superficial femoral proximal popliteal artery stenting. His was done for rest pain and shortness and claudication. However, the patient is minimally ambulatory secondary to osteoarthritis and her significant obesity. She states her right foot rest pain has resolved. Unfortunately she continues to smoke. Other medical problems include diabetes, previous MRSA infection left groin, hypertension, hyperlipidemia, obesity, asthma, sleep apnea. These are all currently stable. She is on Plavix.         Past Medical History   Diagnosis  Date   .  History of hip replacement, total     .  Hx MRSA infection         abscess left groin   .  Arthritis     .  GERD (gastroesophageal reflux disease)     .  Gout     .  Diabetes mellitus     .  Hypertension     .  Thyroid disease     .  Hyperlipidemia     .  TIA (transient ischemic attack)          X2 NO RESIDUAL PROBLEMS   .  Positive cardiac stress test  12/2013       anterior and lateral ischemia on Myoview   .  Hx of cardiac cath  06/2014   .  Asthma     .  Headache     .  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  Cramping of feet     .  Stress incontinence     .  History of transfusion     .  Sleep apnea         UNABLE TO TOLERATE C PAP   .  Chronic kidney disease     .  Chronic kidney disease     .  Anemia     .  Bronchitis     .  Chronic pain syndrome     .  Morbidly obese ([place])     .  Hypothyroid     .  Peripheral vascular disease [facility])             Past Surgical History   Procedure   Laterality  Date   .  Thyroidectomy       .  Abdominal hysterectomy       .  Laparoscopic cholecystectomy       .  Forearm fracture surgery           Left arm   .  Hernia repair           w/ mesh   .  Incision and drainage abscess  Left  02/04/2013       Procedure: INCISION AND DRAINAGE LEFT BUTTOCK ABSCESS; INCISION AND DRAINAGE LEFT BREAST ABSCESS;  Surgeon: Harl Bowie, MD;  Location: [place];  Service: General;  Laterality: Left;   .  Irrigation and debridement abscess  N/A  02/06/2013  Procedure: IRRIGATION AND DEBRIDEMENT BUTTOCK ABSCESS AND DRESSING CHANGE;  Surgeon: Harl Bowie, MD;  Location: [place];  Service: General;  Laterality: N/A;   .  Irrigation and debridement abscess  Left  02/08/2013       Procedure: IRRIGATION AND DEBRIDEMENT ABSCESS/DRESSING CHANGE;  Surgeon: Gwenyth Ober, MD;  Location: [place];  Service: General;  Laterality: Left;   .  Incision and drainage perirectal abscess  Left  02/10/2013       Procedure: IRRIGATION AND DEBRIDEMENT OF BUTTOCK/PERINEAL ABSCESS;  Surgeon: [name]. Georgette Dover, MD;  Location: [place];  Service: General;  Laterality: Left;   .  Incision and drainage abscess  N/A  02/12/2013       Procedure: INCISION AND DEBRIDEMENT BUTTOCK WOUND ;  Surgeon: [name]. Georgette Dover, MD;  Location: [place];  Service: General;  Laterality: N/A;   .  Incision and drainage abscess  N/A  02/14/2013       Procedure: INCISION AND DRAINAGE/DRESSING CHANGE;  Surgeon: Harl Bowie, MD;  Location: [place];  Service: General;  Laterality: N/A;   .  Incision and drainage perirectal abscess  N/A  02/16/2013       Procedure: IRRIGATION AND DEBRIDEMENT PERINEAL ABSCESS;  Surgeon: Zenovia Jarred, MD;  Location: [place];  Service: General;  Laterality: N/A;   .  Joint replacement    2010 / 2012    .  Colon surgery    1995       DUE TO POLYP   .  Total hip revision  Right  08/11/2014       Procedure: RIGHT ACETABULAR REVISION;  Surgeon: Gearlean Alf, MD;  [facility];   Service: Orthopedics;  Laterality: Right;   .  Left heart catheterization with coronary angiogram  N/A  07/02/2014       Procedure: LEFT HEART CATHETERIZATION WITH CORONARY ANGIOGRAM;  Surgeon: Lorretta Harp, MD;  Location: [facility];  Service: Cardiovascular;  Laterality: N/A;   .  Incision and drainage abscess  N/A  03/21/2015       Procedure: INCISION AND DRAINAGE PUBIC ABSCESS;  Surgeon: Excell Seltzer, MD;  Location: [facility];  Service: General;  Laterality: N/A;            Family History   Problem  Relation  Age of Onset   .  Diabetes  Brother     .  Cardiomyopathy  Mother     .  Heart disease  Mother     .  Cancer  Father         SOCIAL HISTORY: Social History          Social History   .  Marital Status:  Married       Spouse Name:  N/A   .  Number of Children:  1   .  Years of Education:  college          Occupational History   .           Disabled           Social History Main Topics   .  Smoking status:  Current Every Day Smoker -- 0.50 packs/day for 40 years       Types:  Cigarettes   .  Smokeless tobacco:  Never Used   .  Alcohol Use:  0.6 oz/week       1 Standard drinks or equivalent per week  Comment: OCC   .  Drug Use:  No         Comment: Quit four years ago.   Marland Kitchen  Sexual Activity:  Not on file         Other Topics  Concern   .  Not on file        Social History Narrative     Patient is se prated  and lives at home with her friend. Patient is disabled.     Education one year of college     Right handed     Caffeine two cups daily.                      Allergies   Allergen  Reactions   .  Ace Inhibitors         Tongue swell   .  Bystolic [Nebivolol Hcl]  Swelling and Other (See Comments)       Chest pain    .  Morphine And Related  Itching       Current Outpatient Prescriptions on File Prior to Visit  Medication Sig Dispense Refill  . albuterol (PROVENTIL) (2.5 MG/3ML) 0.083% nebulizer solution Take 2.5 mg by nebulization  every 6 (six) hours as needed for wheezing or shortness of breath.    . allopurinol (ZYLOPRIM) 100 MG tablet Take 100 mg by mouth every morning.     . B Complex Vitamins (VITAMIN B COMPLEX PO) Take 1 tablet by mouth 2 (two) times daily.     . budesonide-formoterol (SYMBICORT) 160-4.5 MCG/ACT inhaler Inhale 2 puffs into the lungs 2 (two) times daily as needed (for SOB).     . cetirizine (ZYRTEC) 10 MG tablet Take 10 mg by mouth daily as needed for allergies.     Marland Kitchen clopidogrel (PLAVIX) 75 MG tablet Take 1 tablet (75 mg total) by mouth daily. 30 tablet 11  . Dulaglutide (TRULICITY) 2.69 SW/5.4OE SOPN Inject 0.75 mg into the skin every Monday.     . esomeprazole (NEXIUM) 40 MG capsule Take 40 mg by mouth daily.     . fenofibrate (TRICOR) 145 MG tablet TAKE 1 TABLET (145 MG TOTAL) BY MOUTH DAILY. 90 tablet 2  . fluticasone (FLONASE) 50 MCG/ACT nasal spray Place 2 sprays into the nose as needed for allergies.     . furosemide (LASIX) 40 MG tablet Take 180 mg by mouth 2 (two) times daily.    Marland Kitchen levothyroxine (SYNTHROID, LEVOTHROID) 150 MCG tablet Take 150 mcg by mouth daily before breakfast.    . mirabegron ER (MYRBETRIQ) 50 MG TB24 tablet Take 50 mg by mouth daily.    . Multiple Vitamin (MULTIVITAMIN WITH MINERALS) TABS tablet Take 1 tablet by mouth daily.    . naftifine (NAFTIN) 1 % cream APPLY TO THE AFFECTED AREA ON SKIN EVERY DAY (Patient taking differently: APPLY TO THE AFFECTED AREA ON SKIN AS NEEDED FOR IRRITATION) 60 g 0  . nortriptyline (PAMELOR) 10 MG capsule Take 10 mg by mouth at bedtime.    Marland Kitchen oxyCODONE-acetaminophen (PERCOCET) 10-325 MG tablet Take 1 tablet by mouth every 4 (four) hours as needed for pain.    . polyvinyl alcohol (LIQUIFILM TEARS) 1.4 % ophthalmic solution Place 2 drops into both eyes daily as needed for dry eyes.    . potassium chloride SA (K-DUR,KLOR-CON) 20 MEQ tablet Take 40 mEq by mouth 2 (two) times daily.     Marland Kitchen tiZANidine (ZANAFLEX) 4 MG capsule Take 4 mg by mouth 2  (  two) times daily.     . diazepam (VALIUM) 5 MG tablet Take 5 mg by mouth daily as needed.  0  . furosemide (LASIX) 40 MG tablet Take 120 mg by mouth daily.     . insulin aspart protamine- aspart (NOVOLOG MIX 70/30) (70-30) 100 UNIT/ML injection Inject 50 Units into the skin 2 (two) times daily with a meal.      No current facility-administered medications on file prior to visit.      ROS:     General:  No weight loss, Fever, chills   HEENT: No recent headaches, no nasal bleeding, no visual changes, no sore throat   Neurologic: No dizziness, blackouts, seizures. No recent symptoms of stroke or mini- stroke. No recent episodes of slurred speech, or temporary blindness.   Cardiac: No recent episodes of chest pain/pressure, no shortness of breath at rest.  No shortness of breath with exertion.  Denies history of atrial fibrillation or irregular heartbeat   Vascular: No history of rest pain in feet.  + history of claudication.  No history of non-healing ulcer, No history of DVT     Pulmonary: No home oxygen, no productive cough, no hemoptysis,  + asthma or wheezing   Musculoskeletal:  '[ ]'$  Arthritis, '[ ]'$  Low back pain,  '[ ]'$  Joint pain   Hematologic:No history of hypercoagulable state.  No history of easy bleeding.  No history of anemia   Gastrointestinal: No hematochezia or melena,  No gastroesophageal reflux, no trouble swallowing   Urinary: [x ] chronic Kidney disease, '[ ]'$  on HD - '[ ]'$  MWF or '[ ]'$  TTHS, '[ ]'$  Burning with urination, '[ ]'$  Frequent urination, '[ ]'$  Difficulty urinating;     Skin: No rashes   Psychological: No history of anxiety,  No history of depression     Physical Examination    Vitals:   05/17/16 1218  BP: 124/86  Pulse: 76  Resp: 18  Temp: 97.8 F (36.6 C)  TempSrc: Oral  SpO2: 94%  Weight: (!) 320 lb (145.2 kg)  Height: 5' 4.5" (1.638 m)     General:  Alert and oriented, no acute distress Skin: No rash, right foot rubor no ulcer Extremity Pulses:  2+  radial, brachial, femoral, Absent dorsalis pedis, posterior tibial pulses bilaterally Musculoskeletal: No deformity trace right ankle edema right foot no ulcers , 3 cm area of ecchymosis mid right lateral thigh no obvious point tenderness hip or thigh Neurologic: Upper and lower extremity motor 5/5 and symmetric   DATA:  Patient had repeat ABIs today which were 0.65 improved from 0 on the right side and 0.49 on the left side. Arterial duplex suggested right popliteal artery and superficial femoral arteries are patent.    ASSESSMENT:   Patent right superficial and popliteal artery after recent angioplasty and stenting with resolution of her rest pain.  She would not be a candidate for an open bypass procedure due to her significant obesity as well as minimally ambulatory status.     PLAN:   Patient will be at very high risk for restenosis and potentially limb loss if she continues to smoke. She will continue her Plavix. It was again strongly emphasized to the patient today to quit smoking.  She will follow-up in 3 months time with repeat ABIs repeat duplex.     [name], MD [facility] Office: [phone] Pager: [phone]

## 2016-07-10 ENCOUNTER — Other Ambulatory Visit: Payer: Self-pay | Admitting: Internal Medicine

## 2016-07-10 DIAGNOSIS — E2839 Other primary ovarian failure: Secondary | ICD-10-CM

## 2016-07-24 ENCOUNTER — Ambulatory Visit
Admission: RE | Admit: 2016-07-24 | Discharge: 2016-07-24 | Disposition: A | Discharge status: Home / Self Care | Payer: Medicare Other | Source: Ambulatory Visit | Attending: Internal Medicine | Admitting: Internal Medicine

## 2016-07-24 ENCOUNTER — Encounter (HOSPITAL_COMMUNITY): Payer: Self-pay | Admitting: *Deleted

## 2016-07-24 DIAGNOSIS — E2839 Other primary ovarian failure: Secondary | ICD-10-CM

## 2016-08-27 ENCOUNTER — Other Ambulatory Visit: Payer: Self-pay | Admitting: *Deleted

## 2016-08-27 DIAGNOSIS — I739 Peripheral vascular disease, unspecified: Secondary | ICD-10-CM

## 2016-08-29 ENCOUNTER — Encounter: Payer: Self-pay | Admitting: Vascular Surgery

## 2016-09-06 ENCOUNTER — Encounter (HOSPITAL_COMMUNITY): Payer: Medicare Other

## 2016-09-06 ENCOUNTER — Ambulatory Visit: Payer: Medicare Other | Admitting: Vascular Surgery

## 2016-09-06 ENCOUNTER — Inpatient Hospital Stay (HOSPITAL_COMMUNITY): Admission: RE | Admit: 2016-09-06 | Payer: Medicare Other | Source: Ambulatory Visit

## 2016-09-11 ENCOUNTER — Telehealth: Payer: Self-pay | Admitting: Cardiovascular Disease

## 2016-09-11 NOTE — Telephone Encounter (Signed)
Okay to interrupt antiplatelet therapy for injection

## 2016-09-11 NOTE — Telephone Encounter (Signed)
Requesting surgical clearance:  1. Type of procedure:  Injection---not specified what type  2. Surgeon: not specified  3.Surgical Date:  09/19/16  4. Medications that need to be held: Plavix, 5 days prior to injection   5. CAD: No  6. I will defer to:  Dr. Pearla Dubonnet Information:  Phone: [phone] Fax: [phone]

## 2016-09-12 ENCOUNTER — Encounter: Payer: Self-pay | Admitting: Cardiovascular Disease

## 2016-09-12 NOTE — Telephone Encounter (Signed)
Clearance faxed to number provided via EPIC. 

## 2016-11-27 ENCOUNTER — Other Ambulatory Visit: Payer: Self-pay | Admitting: Internal Medicine

## 2016-11-27 DIAGNOSIS — Z1231 Encounter for screening mammogram for malignant neoplasm of breast: Secondary | ICD-10-CM

## 2016-11-30 ENCOUNTER — Encounter: Payer: Self-pay | Admitting: Vascular Surgery

## 2016-12-06 ENCOUNTER — Ambulatory Visit: Payer: Medicare Other | Admitting: Vascular Surgery

## 2016-12-06 ENCOUNTER — Ambulatory Visit (INDEPENDENT_AMBULATORY_CARE_PROVIDER_SITE_OTHER)
Admission: RE | Admit: 2016-12-06 | Discharge: 2016-12-06 | Disposition: A | Discharge status: Home / Self Care | Payer: Medicare Other | Source: Ambulatory Visit | Attending: Vascular Surgery | Admitting: Vascular Surgery

## 2016-12-06 ENCOUNTER — Ambulatory Visit (HOSPITAL_COMMUNITY)
Admission: RE | Admit: 2016-12-06 | Discharge: 2016-12-06 | Disposition: A | Discharge status: Home / Self Care | Payer: Medicare Other | Source: Ambulatory Visit | Attending: Vascular Surgery | Admitting: Vascular Surgery

## 2016-12-06 DIAGNOSIS — Z95820 Peripheral vascular angioplasty status with implants and grafts: Secondary | ICD-10-CM | POA: Diagnosis not present

## 2016-12-06 DIAGNOSIS — I739 Peripheral vascular disease, unspecified: Secondary | ICD-10-CM | POA: Insufficient documentation

## 2016-12-07 ENCOUNTER — Encounter: Payer: Self-pay | Admitting: Vascular Surgery

## 2016-12-13 ENCOUNTER — Ambulatory Visit
Admission: RE | Admit: 2016-12-13 | Discharge: 2016-12-13 | Disposition: A | Discharge status: Home / Self Care | Payer: Medicare Other | Source: Ambulatory Visit | Attending: Internal Medicine | Admitting: Internal Medicine

## 2016-12-13 DIAGNOSIS — Z1231 Encounter for screening mammogram for malignant neoplasm of breast: Secondary | ICD-10-CM

## 2016-12-17 ENCOUNTER — Other Ambulatory Visit: Payer: Self-pay | Admitting: Internal Medicine

## 2016-12-17 DIAGNOSIS — R928 Other abnormal and inconclusive findings on diagnostic imaging of breast: Secondary | ICD-10-CM

## 2016-12-19 ENCOUNTER — Encounter: Payer: Self-pay | Admitting: Vascular Surgery

## 2016-12-19 ENCOUNTER — Ambulatory Visit (INDEPENDENT_AMBULATORY_CARE_PROVIDER_SITE_OTHER): Payer: Medicare Other | Admitting: Vascular Surgery

## 2016-12-19 VITALS — BP 159/79 | HR 100 | Temp 97.9°F | Resp 18 | Ht 64.0 in | Wt 289.0 lb

## 2016-12-19 DIAGNOSIS — I739 Peripheral vascular disease, unspecified: Secondary | ICD-10-CM

## 2016-12-19 NOTE — Progress Notes (Signed)
Vitals:   12/19/16 1336  BP: (!) 151/85  Pulse: 100  Resp: 18  Temp: 97.9 F (36.6 C)  SpO2: 95%  Weight: 289 lb (131.1 kg)  Height: '5\' 4"'$  (1.626 m)

## 2016-12-19 NOTE — Progress Notes (Signed)
Referring Physician: [name], MD   Patient name: [name] [name]        MRN: [id number]       DOB: [birth year]           Sex: female   REASON FOR CONSULT: Right foot pain   HPI: [name] [name] is a 66 y.o. female that we have seen intermittently over the last several years for peripheral arterial disease. She underwent angioplasty with a drug-eluting balloon of her right superficial femoral artery as well as distal right superficial femoral proximal popliteal artery stenting. His was done for rest pain and short distance claudication. However, the patient is minimally ambulatory secondary to osteoarthritis/back pain and her significant obesity. She states her right foot rest pain has resolved.  She denies claudication. She has no nonhealing wounds. Unfortunately she continues to smoke. Other medical problems include diabetes, previous MRSA infection left groin, hypertension, hyperlipidemia, obesity, asthma, sleep apnea. These are all currently stable. She is on Plavix.               Past Medical History   Diagnosis  Date   .  History of hip replacement, total     .  Hx MRSA infection         abscess left groin   .  Arthritis     .  GERD (gastroesophageal reflux disease)     .  Gout     .  Diabetes mellitus     .  Hypertension     .  Thyroid disease     .  Hyperlipidemia     .  TIA (transient ischemic attack)          X2 NO RESIDUAL PROBLEMS   .  Positive cardiac stress test  12/2013       anterior and lateral ischemia on Myoview   .  Hx of cardiac cath  06/2014   .  Asthma     .  Headache     .  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  Cramping of feet     .  Stress incontinence     .  History of transfusion     .  Sleep apnea         UNABLE TO TOLERATE C PAP   .  Chronic kidney disease     .  Chronic kidney disease     .  Anemia     .  Bronchitis     .  Chronic pain syndrome     .  Morbidly obese ([place])     .  Hypothyroid     .  Peripheral vascular disease  [facility])                  Past Surgical History   Procedure  Laterality  Date   .  Thyroidectomy       .  Abdominal hysterectomy       .  Laparoscopic cholecystectomy       .  Forearm fracture surgery           Left arm   .  Hernia repair           w/ mesh   .  Incision and drainage abscess  Left  02/04/2013       Procedure: INCISION AND DRAINAGE LEFT BUTTOCK ABSCESS; INCISION AND DRAINAGE LEFT BREAST ABSCESS;  Surgeon: Harl Bowie, MD;  Location:  [place] [facility];  Service: General;  Laterality: Left;   .  Irrigation and debridement abscess  N/A  02/06/2013       Procedure: IRRIGATION AND DEBRIDEMENT BUTTOCK ABSCESS AND DRESSING CHANGE;  Surgeon: Harl Bowie, MD;  Location: [place];  Service: General;  Laterality: N/A;   .  Irrigation and debridement abscess  Left  02/08/2013       Procedure: IRRIGATION AND DEBRIDEMENT ABSCESS/DRESSING CHANGE;  Surgeon: Gwenyth Ober, MD;  Location: [place];  Service: General;  Laterality: Left;   .  Incision and drainage perirectal abscess  Left  02/10/2013       Procedure: IRRIGATION AND DEBRIDEMENT OF BUTTOCK/PERINEAL ABSCESS;  Surgeon: [name]. Georgette Dover, MD;  Location: [place];  Service: General;  Laterality: Left;   .  Incision and drainage abscess  N/A  02/12/2013       Procedure: INCISION AND DEBRIDEMENT BUTTOCK WOUND ;  Surgeon: [name]. Georgette Dover, MD;  Location: [place];  Service: General;  Laterality: N/A;   .  Incision and drainage abscess  N/A  02/14/2013       Procedure: INCISION AND DRAINAGE/DRESSING CHANGE;  Surgeon: Harl Bowie, MD;  Location: [place];  Service: General;  Laterality: N/A;   .  Incision and drainage perirectal abscess  N/A  02/16/2013       Procedure: IRRIGATION AND DEBRIDEMENT PERINEAL ABSCESS;  Surgeon: Zenovia Jarred, MD;  Location: [place];  Service: General;  Laterality: N/A;   .  Joint replacement    2010 / 2012    .  Colon surgery    1995       DUE TO POLYP   .  Total hip revision  Right  08/11/2014       Procedure: RIGHT  ACETABULAR REVISION;  Surgeon: Gearlean Alf, MD;  [facility];  Service: Orthopedics;  Laterality: Right;   .  Left heart catheterization with coronary angiogram  N/A  07/02/2014       Procedure: LEFT HEART CATHETERIZATION WITH CORONARY ANGIOGRAM;  Surgeon: Lorretta Harp, MD;  Location: [facility];  Service: Cardiovascular;  Laterality: N/A;   .  Incision and drainage abscess  N/A  03/21/2015       Procedure: INCISION AND DRAINAGE PUBIC ABSCESS;  Surgeon: Excell Seltzer, MD;  [facility];  Service: General;  Laterality: N/A;                Family History   Problem  Relation  Age of Onset   .  Diabetes  Brother     .  Cardiomyopathy  Mother     .  Heart disease  Mother     .  Cancer  Father         SOCIAL HISTORY: Social History              Social History   .  Marital Status:  Married       Spouse Name:  N/A   .  Number of Children:  1   .  Years of Education:  college              Occupational History   .           Disabled                Social History Main Topics   .  Smoking status:  Current Every Day Smoker -- 0.50 packs/day for 40 years  Types:  Cigarettes   .  Smokeless tobacco:  Never Used   .  Alcohol Use:  0.6 oz/week       1 Standard drinks or equivalent per week         Comment: OCC   .  Drug Use:  No         Comment: Quit four years ago.   Marland Kitchen  Sexual Activity:  Not on file            Other Topics  Concern   .  Not on file          Social History Narrative     Patient is se prated  and lives at home with her friend. Patient is disabled.     Education one year of college     Right handed     Caffeine two cups daily.                          Allergies   Allergen  Reactions   .  Ace Inhibitors         Tongue swell   .  Bystolic [Nebivolol Hcl]  Swelling and Other (See Comments)       Chest pain    .  Morphine And Related  Itching       Current Outpatient Prescriptions on File Prior to Visit  Medication Sig Dispense  Refill  . albuterol (PROVENTIL) (2.5 MG/3ML) 0.083% nebulizer solution Take 2.5 mg by nebulization every 6 (six) hours as needed for wheezing or shortness of breath.    . allopurinol (ZYLOPRIM) 100 MG tablet Take 100 mg by mouth every morning.     . B Complex Vitamins (VITAMIN B COMPLEX PO) Take 1 tablet by mouth 2 (two) times daily.     . budesonide-formoterol (SYMBICORT) 160-4.5 MCG/ACT inhaler Inhale 2 puffs into the lungs 2 (two) times daily as needed (for SOB).     . cetirizine (ZYRTEC) 10 MG tablet Take 10 mg by mouth daily as needed for allergies.     Marland Kitchen clopidogrel (PLAVIX) 75 MG tablet Take 1 tablet (75 mg total) by mouth daily. 30 tablet 11  . Dulaglutide (TRULICITY) 7.16 RC/7.8LF SOPN Inject 0.75 mg into the skin every Monday.     . fenofibrate (TRICOR) 145 MG tablet TAKE 1 TABLET (145 MG TOTAL) BY MOUTH DAILY. 90 tablet 2  . fluticasone (FLONASE) 50 MCG/ACT nasal spray Place 2 sprays into the nose as needed for allergies.     . furosemide (LASIX) 40 MG tablet Take 120 mg by mouth daily.     . insulin aspart protamine- aspart (NOVOLOG MIX 70/30) (70-30) 100 UNIT/ML injection Inject 50 Units into the skin 2 (two) times daily with a meal.     . insulin lispro protamine-lispro (HUMALOG 75/25 MIX) (75-25) 100 UNIT/ML SUSP injection Inject 50 Units into the skin 2 (two) times daily with a meal.    . levothyroxine (SYNTHROID, LEVOTHROID) 150 MCG tablet Take 150 mcg by mouth daily before breakfast.    . mirabegron ER (MYRBETRIQ) 50 MG TB24 tablet Take 50 mg by mouth daily.    . Multiple Vitamin (MULTIVITAMIN WITH MINERALS) TABS tablet Take 1 tablet by mouth daily.    . naftifine (NAFTIN) 1 % cream APPLY TO THE AFFECTED AREA ON SKIN EVERY DAY (Patient taking differently: APPLY TO THE AFFECTED AREA ON SKIN AS NEEDED FOR IRRITATION) 60 g 0  . oxyCODONE-acetaminophen (PERCOCET) 10-325 MG  tablet Take 1 tablet by mouth every 4 (four) hours as needed for pain.    . polyvinyl alcohol (LIQUIFILM TEARS)  1.4 % ophthalmic solution Place 2 drops into both eyes daily as needed for dry eyes.    . potassium chloride SA (K-DUR,KLOR-CON) 20 MEQ tablet Take 40 mEq by mouth 2 (two) times daily.     Marland Kitchen SIMVASTATIN PO Take by mouth daily.    . diazepam (VALIUM) 5 MG tablet Take 5 mg by mouth daily as needed.  0  . esomeprazole (NEXIUM) 40 MG capsule Take 40 mg by mouth daily.     . furosemide (LASIX) 40 MG tablet Take 180 mg by mouth 2 (two) times daily.    . nortriptyline (PAMELOR) 10 MG capsule Take 10 mg by mouth at bedtime.    Marland Kitchen tiZANidine (ZANAFLEX) 4 MG capsule Take 4 mg by mouth 2 (two) times daily.      No current facility-administered medications on file prior to visit.      ROS:     General:  No weight loss, Fever, chills   HEENT: No recent headaches, no nasal bleeding, no visual changes, no sore throat   Neurologic: No dizziness, blackouts, seizures. No recent symptoms of stroke or mini- stroke. No recent episodes of slurred speech, or temporary blindness.   Cardiac: No recent episodes of chest pain/pressure, no shortness of breath at rest.  No shortness of breath with exertion.  Denies history of atrial fibrillation or irregular heartbeat   Vascular: No history of rest pain in feet.  + history of claudication.  No history of non-healing ulcer, No history of DVT     Pulmonary: No home oxygen, no productive cough, no hemoptysis,  + asthma or wheezing   Musculoskeletal:  '[ ]'$  Arthritis, '[ ]'$  Low back pain,  '[ ]'$  Joint pain   Hematologic:No history of hypercoagulable state.  No history of easy bleeding.  No history of anemia   Gastrointestinal: No hematochezia or melena,  No gastroesophageal reflux, no trouble swallowing   Urinary: [x ] chronic Kidney disease, '[ ]'$  on HD - '[ ]'$  MWF or '[ ]'$  TTHS, '[ ]'$  Burning with urination, '[ ]'$  Frequent urination, '[ ]'$  Difficulty urinating;     Skin: No rashes   Psychological: No history of anxiety,  No history of depression     Physical Examination        Vitals:   12/19/16 1336 12/19/16 1342  BP: (!) 151/85 (!) 159/79  Pulse: 100 100  Resp: 18   Temp: 97.9 F (36.6 C)   SpO2: 95%   Weight: 289 lb (131.1 kg)   Height: '5\' 4"'$  (1.626 m)      General:  Alert and oriented, no acute distress Skin: No rash, no ulcer Extremity Pulses:  2+ radial, brachial, femoral, Absent dorsalis pedis, posterior tibial pulses bilaterally Musculoskeletal: No deformity trace right ankle edema right foot no ulcers Neurologic: Upper and lower extremity motor 5/5 and symmetric   DATA:  Patient had repeat ABIs today which were 0.61 improved from 0 on the right side pre procedure and 0.49 on the left side. Arterial duplex suggested right popliteal artery and superficial femoral arteries are patent.    ASSESSMENT:   Patent right superficial and popliteal artery after recent angioplasty and stenting by duplex with resolution of her rest pain.   however, her ABIs are discrepant from the duplex findings and I believe we need to get a CT scan with runoff to establish whether  or not the SFA is still patent bilaterally.  We will schedule this in the next few weeks and then she will see me following the CT scan for further plan.  Greater than 3 minutes today spent regarding smoking cessation counseling.  She would not be a candidate for an open bypass procedure due to her significant obesity as well as minimally ambulatory status.     PLAN:   Patient will be at very high risk for restenosis and potentially limb loss if she continues to smoke. She will continue her Plavix. It was again strongly emphasized to the patient today to quit smoking.   She will follow-up with me after her CT angiogram of the abdomen and pelvis with lower extremity runoff.     [name], MD [facility] [place] Office: [phone] Pager: [phone]

## 2016-12-19 NOTE — Addendum Note (Signed)
Addended by: Lianne Cure A on: 12/19/2016 03:08 PM   Modules accepted: Orders

## 2016-12-20 ENCOUNTER — Other Ambulatory Visit: Payer: Medicare Other

## 2016-12-26 ENCOUNTER — Other Ambulatory Visit: Payer: Medicare Other

## 2016-12-27 ENCOUNTER — Ambulatory Visit: Payer: Medicare Other | Admitting: Vascular Surgery

## 2016-12-31 ENCOUNTER — Ambulatory Visit
Admission: RE | Admit: 2016-12-31 | Discharge: 2016-12-31 | Disposition: A | Discharge status: Home / Self Care | Payer: Medicare Other | Source: Ambulatory Visit | Attending: Internal Medicine | Admitting: Internal Medicine

## 2016-12-31 ENCOUNTER — Other Ambulatory Visit: Payer: Self-pay | Admitting: *Deleted

## 2016-12-31 ENCOUNTER — Ambulatory Visit
Admission: RE | Admit: 2016-12-31 | Discharge: 2016-12-31 | Disposition: A | Discharge status: Home / Self Care | Payer: Medicare Other | Source: Ambulatory Visit | Attending: Vascular Surgery | Admitting: Vascular Surgery

## 2016-12-31 ENCOUNTER — Telehealth: Payer: Self-pay | Admitting: *Deleted

## 2016-12-31 DIAGNOSIS — R928 Other abnormal and inconclusive findings on diagnostic imaging of breast: Secondary | ICD-10-CM

## 2016-12-31 DIAGNOSIS — I739 Peripheral vascular disease, unspecified: Secondary | ICD-10-CM

## 2016-12-31 MED ORDER — IOPAMIDOL (ISOVUE-370) INJECTION 76%: 125.0000 mL | Freq: Once | INTRAVENOUS | Status: DC | PRN

## 2016-12-31 NOTE — Telephone Encounter (Signed)
LMTCB to patient to discuss this.     Elam Dutch, MD sent to Mena Goes, RN        Repeat duplex and ABIs in June   [name]   Previous Messages    ----- Message -----  From: Mena Goes, RN  Sent: 12/31/2016 11:43 AM  To: Elam Dutch, MD  Subject: CTA w/ runoff cancelled              Imaging called to cancel her Runoff CTA. Pt's labwork abnormal. Creat 2.6, BUN 76 and GFR is 21. She is the pt that had the discrepancy between her ABIs and LE duplex. They won't do dye with GFR below 30. What do you what me to schedule?

## 2016-12-31 NOTE — Telephone Encounter (Signed)
-----   Message from Elam Dutch, MD sent at 12/31/2016  3:54 PM EDT ----- She needs to see renal if she hasnt in the past  [name] ----- Message ----- From: Doyne Keel. Danie Chandler Sent: 12/31/2016  11:53 AM To: Elam Dutch, MD

## 2017-01-01 ENCOUNTER — Telehealth: Payer: Self-pay | Admitting: Vascular Surgery

## 2017-01-01 NOTE — Telephone Encounter (Signed)
-----   Message from Mena Goes, RN sent at 12/31/2016  4:26 PM EDT ----- Regarding: Orders are in EPIC    ----- Message ----- From: Elam Dutch, MD Sent: 12/31/2016   3:52 PM To: Mena Goes, RN Subject: RE: CTA w/ runoff cancelled                    Repeat duplex and ABIs in June  Charles Fields ----- Message ----- From: Mena Goes, RN Sent: 12/31/2016  11:43 AM To: Elam Dutch, MD Subject: CTA w/ runoff cancelled                        Imaging called to cancel her Runoff CTA. Pt's labwork abnormal. Creat 2.6, BUN 76 and GFR is 21. She is the pt that had the discrepancy between her ABIs and LE duplex. They won't do dye with GFR below 30. What do you what me to schedule?

## 2017-01-01 NOTE — Telephone Encounter (Signed)
Sched appt 04/04/17; lab at 12:00, MD at 1:00. Spoke to pt, pt said she will be out of town that day. Resched to 03/14/17; labs at 3:00, MD at 3:45.

## 2017-01-17 ENCOUNTER — Ambulatory Visit: Payer: Medicare Other | Admitting: Vascular Surgery

## 2017-03-11 ENCOUNTER — Encounter: Payer: Self-pay | Admitting: Vascular Surgery

## 2017-03-14 ENCOUNTER — Encounter: Payer: Self-pay | Admitting: Vascular Surgery

## 2017-03-14 ENCOUNTER — Ambulatory Visit (HOSPITAL_COMMUNITY)
Admission: RE | Admit: 2017-03-14 | Discharge: 2017-03-14 | Disposition: A | Discharge status: Home / Self Care | Payer: Medicare Other | Source: Ambulatory Visit | Attending: Vascular Surgery | Admitting: Vascular Surgery

## 2017-03-14 ENCOUNTER — Ambulatory Visit (INDEPENDENT_AMBULATORY_CARE_PROVIDER_SITE_OTHER): Payer: Medicare Other | Admitting: Vascular Surgery

## 2017-03-14 ENCOUNTER — Ambulatory Visit (INDEPENDENT_AMBULATORY_CARE_PROVIDER_SITE_OTHER)
Admission: RE | Admit: 2017-03-14 | Discharge: 2017-03-14 | Disposition: A | Discharge status: Home / Self Care | Payer: Medicare Other | Source: Ambulatory Visit | Attending: Vascular Surgery | Admitting: Vascular Surgery

## 2017-03-14 VITALS — BP 185/99 | HR 105 | Temp 97.4°F | Resp 18 | Ht 64.0 in | Wt 285.5 lb

## 2017-03-14 DIAGNOSIS — I739 Peripheral vascular disease, unspecified: Secondary | ICD-10-CM

## 2017-03-14 DIAGNOSIS — Z959 Presence of cardiac and vascular implant and graft, unspecified: Secondary | ICD-10-CM | POA: Diagnosis not present

## 2017-03-14 NOTE — Progress Notes (Signed)
Patient is a 66 year old female who returns for follow-up today for peripheral arterial disease. She underwent angioplasty and drug-eluting balloon in her right superficial femoral artery as well as distal right superficial femoral proximal popliteal artery stenting. This was done July 2017. She does not currently complain of rest pain or claudication symptoms. However, she is minimally ambulatory. She states that she has lost some weight recently about 5 pounds. She still currently weighs 285 pounds. Unfortunate she continues to smoke and she was counseled against this again today. She is on Plavix and on fenofibrate for her cholesterol.  Review of systems: She develops shortness of breath with minimal exertion. She denies chest pain. She recently had an injection for her back and states that this back issue limits her walking as well.  Current Outpatient Prescriptions on File Prior to Visit  Medication Sig Dispense Refill  . albuterol (PROVENTIL) (2.5 MG/3ML) 0.083% nebulizer solution Take 2.5 mg by nebulization every 6 (six) hours as needed for wheezing or shortness of breath.    . allopurinol (ZYLOPRIM) 100 MG tablet Take 100 mg by mouth every morning.     . B Complex Vitamins (VITAMIN B COMPLEX PO) Take 1 tablet by mouth 2 (two) times daily.     . budesonide-formoterol (SYMBICORT) 160-4.5 MCG/ACT inhaler Inhale 2 puffs into the lungs 2 (two) times daily as needed (for SOB).     . cetirizine (ZYRTEC) 10 MG tablet Take 10 mg by mouth daily as needed for allergies.     Marland Kitchen clopidogrel (PLAVIX) 75 MG tablet Take 1 tablet (75 mg total) by mouth daily. 30 tablet 11  . Dulaglutide (TRULICITY) 6.38 VF/6.4PP SOPN Inject 0.75 mg into the skin every Monday.     . fenofibrate (TRICOR) 145 MG tablet TAKE 1 TABLET (145 MG TOTAL) BY MOUTH DAILY. 90 tablet 2  . furosemide (LASIX) 40 MG tablet Take 120 mg by mouth daily.     . furosemide (LASIX) 40 MG tablet Take 180 mg by mouth 2 (two) times daily.    . insulin  aspart protamine- aspart (NOVOLOG MIX 70/30) (70-30) 100 UNIT/ML injection Inject 50 Units into the skin 2 (two) times daily with a meal.     . levothyroxine (SYNTHROID, LEVOTHROID) 150 MCG tablet Take 150 mcg by mouth daily before breakfast.    . mirabegron ER (MYRBETRIQ) 50 MG TB24 tablet Take 50 mg by mouth daily.    . Multiple Vitamin (MULTIVITAMIN WITH MINERALS) TABS tablet Take 1 tablet by mouth daily.    . naftifine (NAFTIN) 1 % cream APPLY TO THE AFFECTED AREA ON SKIN EVERY DAY (Patient taking differently: APPLY TO THE AFFECTED AREA ON SKIN AS NEEDED FOR IRRITATION) 60 g 0  . oxyCODONE-acetaminophen (PERCOCET) 10-325 MG tablet Take 1 tablet by mouth every 4 (four) hours as needed for pain.    . polyvinyl alcohol (LIQUIFILM TEARS) 1.4 % ophthalmic solution Place 2 drops into both eyes daily as needed for dry eyes.    . potassium chloride SA (K-DUR,KLOR-CON) 20 MEQ tablet Take 40 mEq by mouth 2 (two) times daily.     Marland Kitchen SIMVASTATIN PO Take by mouth daily.    Marland Kitchen tiZANidine (ZANAFLEX) 4 MG capsule Take 4 mg by mouth 2 (two) times daily.     . diazepam (VALIUM) 5 MG tablet Take 5 mg by mouth daily as needed.  0  . esomeprazole (NEXIUM) 40 MG capsule Take 40 mg by mouth daily.     . fluticasone (FLONASE) 50 MCG/ACT nasal spray  Place 2 sprays into the nose as needed for allergies.     Marland Kitchen insulin lispro protamine-lispro (HUMALOG 75/25 MIX) (75-25) 100 UNIT/ML SUSP injection Inject 50 Units into the skin 2 (two) times daily with a meal.    . nortriptyline (PAMELOR) 10 MG capsule Take 10 mg by mouth at bedtime.     No current facility-administered medications on file prior to visit.      Physical exam:  Vitals:   03/14/17 1612 03/14/17 1613  BP: (!) 182/98 (!) 185/99  Pulse: (!) 105   Resp: 18   Temp: 97.4 F (36.3 C)   TempSrc: Oral   SpO2: 94%   Weight: 285 lb 8 oz (129.5 kg)   Height: 5\' 4"  (1.626 m)     Extremities: No palpable pedal pulses  Abdomen: Obese  Data: Patient had  bilateral ABIs performed today which were 0.61 on the left 0.61 on the right monophasic waveforms bilaterally duplex ultrasound shows a right lower extremity stent with triphasic waveforms throughout the stent.  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  Plan: The patient will follow-up in 6 months time with repeat duplex and bilateral ABIs. She'll follow-up sooner she has any worsening of her symptoms.  [name], MD [facility] Office: [phone] Pager: [phone]

## 2017-03-27 ENCOUNTER — Ambulatory Visit: Payer: Medicare Other | Admitting: Podiatry

## 2017-03-29 NOTE — Addendum Note (Signed)
Addended by: Lianne Cure A on: 03/29/2017 09:38 AM   Modules accepted: Orders

## 2017-04-04 ENCOUNTER — Encounter (HOSPITAL_COMMUNITY): Payer: Medicare Other

## 2017-04-04 ENCOUNTER — Ambulatory Visit: Payer: Medicare Other | Admitting: Vascular Surgery

## 2017-05-06 ENCOUNTER — Emergency Department (HOSPITAL_COMMUNITY): Payer: Medicare Other

## 2017-05-06 ENCOUNTER — Encounter (HOSPITAL_COMMUNITY): Payer: Self-pay | Admitting: Emergency Medicine

## 2017-05-06 ENCOUNTER — Emergency Department (HOSPITAL_COMMUNITY)
Admission: EM | Admit: 2017-05-06 | Discharge: 2017-05-07 | Disposition: A | Discharge status: Home / Self Care | Payer: Medicare Other | Attending: Emergency Medicine | Admitting: Emergency Medicine

## 2017-05-06 DIAGNOSIS — K644 Residual hemorrhoidal skin tags: Secondary | ICD-10-CM | POA: Diagnosis not present

## 2017-05-06 DIAGNOSIS — K625 Hemorrhage of anus and rectum: Secondary | ICD-10-CM

## 2017-05-06 DIAGNOSIS — J45909 Unspecified asthma, uncomplicated: Secondary | ICD-10-CM | POA: Diagnosis not present

## 2017-05-06 DIAGNOSIS — F17210 Nicotine dependence, cigarettes, uncomplicated: Secondary | ICD-10-CM | POA: Insufficient documentation

## 2017-05-06 DIAGNOSIS — I129 Hypertensive chronic kidney disease with stage 1 through stage 4 chronic kidney disease, or unspecified chronic kidney disease: Secondary | ICD-10-CM | POA: Insufficient documentation

## 2017-05-06 DIAGNOSIS — Z794 Long term (current) use of insulin: Secondary | ICD-10-CM | POA: Diagnosis not present

## 2017-05-06 DIAGNOSIS — D649 Anemia, unspecified: Secondary | ICD-10-CM | POA: Diagnosis not present

## 2017-05-06 DIAGNOSIS — N183 Chronic kidney disease, stage 3 (moderate): Secondary | ICD-10-CM | POA: Insufficient documentation

## 2017-05-06 DIAGNOSIS — Z8673 Personal history of transient ischemic attack (TIA), and cerebral infarction without residual deficits: Secondary | ICD-10-CM | POA: Insufficient documentation

## 2017-05-06 DIAGNOSIS — E119 Type 2 diabetes mellitus without complications: Secondary | ICD-10-CM | POA: Insufficient documentation

## 2017-05-06 DIAGNOSIS — R1030 Lower abdominal pain, unspecified: Secondary | ICD-10-CM | POA: Diagnosis present

## 2017-05-06 LAB — CBC
HCT: 36.2 % (ref 36.0–46.0)
HCT: 42 % (ref 36.0–46.0)
Hemoglobin: 11.1 g/dL — ABNORMAL LOW (ref 12.0–15.0)
Hemoglobin: 13.3 g/dL (ref 12.0–15.0)
MCH: 26.1 pg (ref 26.0–34.0)
MCH: 26.5 pg (ref 26.0–34.0)
MCHC: 30.7 g/dL (ref 30.0–36.0)
MCHC: 31.7 g/dL (ref 30.0–36.0)
MCV: 85 fL (ref 78.0–100.0)
MCV: 83.7 fL (ref 78.0–100.0)
Platelets: 343 10*3/uL (ref 150–400)
Platelets: 299 10*3/uL (ref 150–400)
RBC: 4.26 MIL/uL (ref 3.87–5.11)
RBC: 5.02 MIL/uL (ref 3.87–5.11)
RDW: 15.6 % — ABNORMAL HIGH (ref 11.5–15.5)
RDW: 15.1 % (ref 11.5–15.5)
WBC: 11.8 10*3/uL — ABNORMAL HIGH (ref 4.0–10.5)
WBC: 13.4 10*3/uL — ABNORMAL HIGH (ref 4.0–10.5)

## 2017-05-06 LAB — URINALYSIS, ROUTINE W REFLEX MICROSCOPIC
Bilirubin Urine: NEGATIVE
Glucose, UA: 150 mg/dL — AB
Hgb urine dipstick: NEGATIVE
Ketones, ur: NEGATIVE mg/dL
Leukocytes, UA: NEGATIVE
Nitrite: NEGATIVE
Protein, ur: NEGATIVE mg/dL
Specific Gravity, Urine: 1.014 (ref 1.005–1.030)
pH: 5 (ref 5.0–8.0)

## 2017-05-06 LAB — COMPREHENSIVE METABOLIC PANEL
ALT: 29 U/L (ref 14–54)
AST: 33 U/L (ref 15–41)
Albumin: 3.3 g/dL — ABNORMAL LOW (ref 3.5–5.0)
Alkaline Phosphatase: 58 U/L (ref 38–126)
Anion gap: 13 (ref 5–15)
BUN: 91 mg/dL — ABNORMAL HIGH (ref 6–20)
CO2: 28 mmol/L (ref 22–32)
Calcium: 9.6 mg/dL (ref 8.9–10.3)
Chloride: 97 mmol/L — ABNORMAL LOW (ref 101–111)
Creatinine, Ser: 2.44 mg/dL — ABNORMAL HIGH (ref 0.44–1.00)
GFR calc Af Amer: 23 mL/min — ABNORMAL LOW (ref >60)
GFR calc non Af Amer: 20 mL/min — ABNORMAL LOW (ref >60)
Glucose, Bld: 191 mg/dL — ABNORMAL HIGH (ref 65–99)
Potassium: 3.2 mmol/L — ABNORMAL LOW (ref 3.5–5.1)
Sodium: 138 mmol/L (ref 135–145)
Total Bilirubin: 0.5 mg/dL (ref 0.3–1.2)
Total Protein: 7 g/dL (ref 6.5–8.1)

## 2017-05-06 LAB — POC OCCULT BLOOD, ED: Fecal Occult Bld: POSITIVE — AB

## 2017-05-06 LAB — LIPASE, BLOOD: Lipase: 91 U/L — ABNORMAL HIGH (ref 11–51)

## 2017-05-06 IMAGING — US US RENAL
1 series · 14 of 25 positions shown · non-contrast
Comparison: None.

CLINICAL DATA: Chronic kidney disease. Incontinence. Hypertension.
History of renal cysts.

EXAM:
RENAL / URINARY TRACT ULTRASOUND COMPLETE

[Series 1: us renal · 0.28mm/px · 14 of 36 slices shown]
[im 1/36]
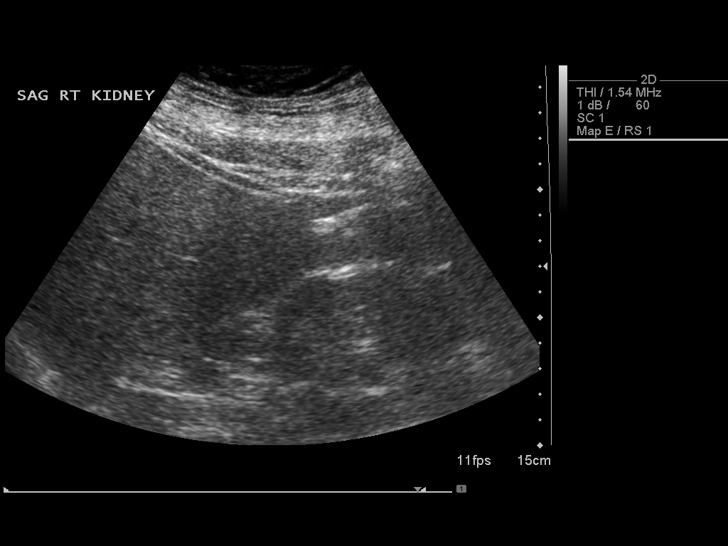
[im 3/36]
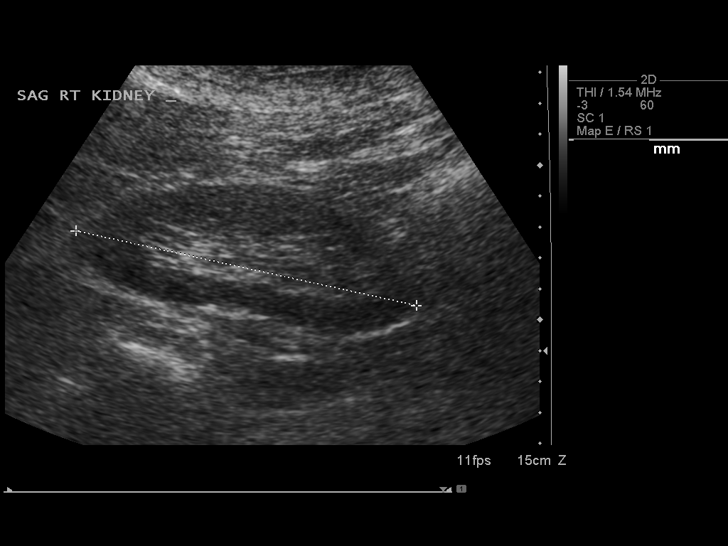
[im 6/36]
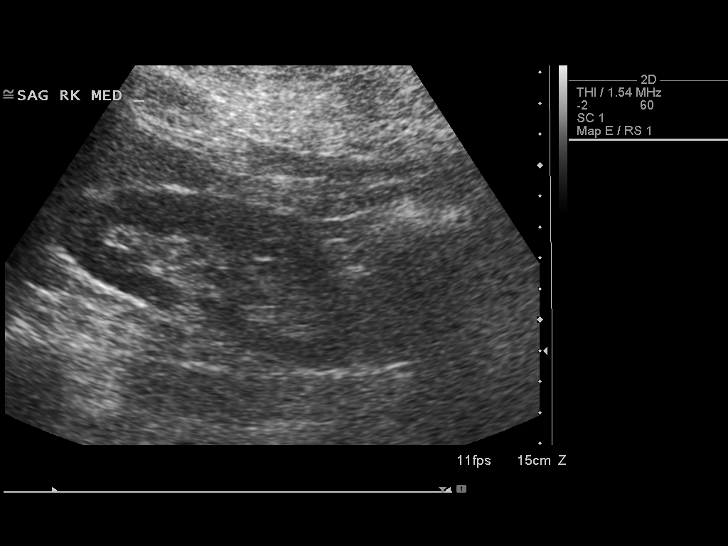
[im 9/36]
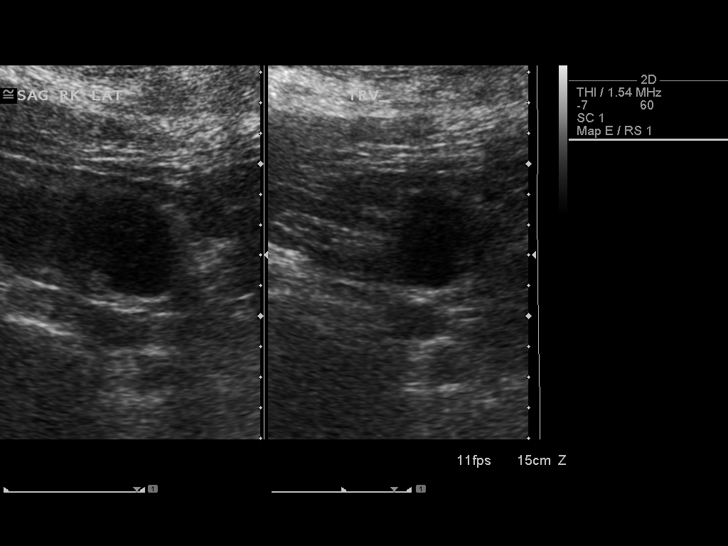
[im 12/36]
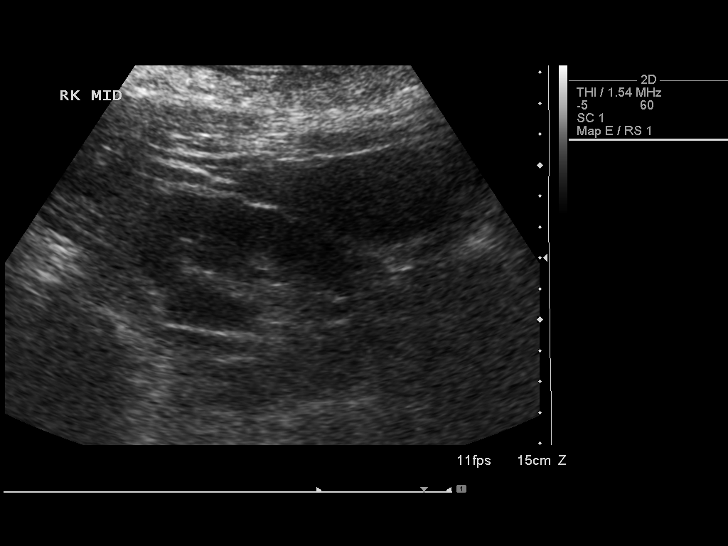
[im 14/36]
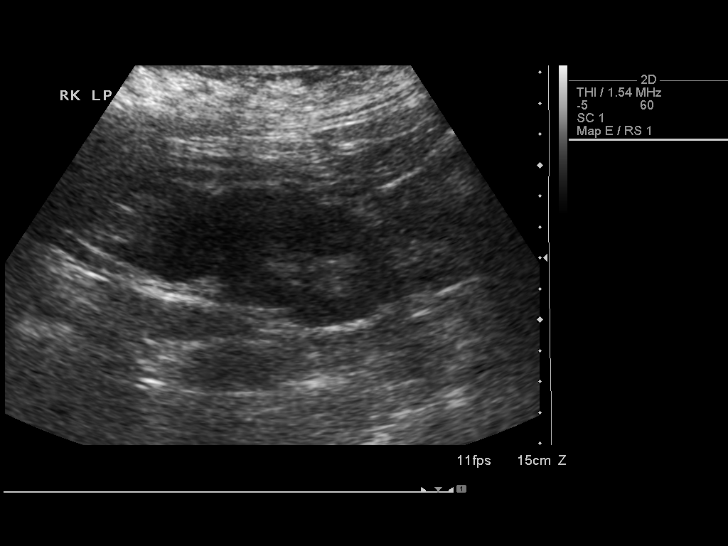
[im 17/36]
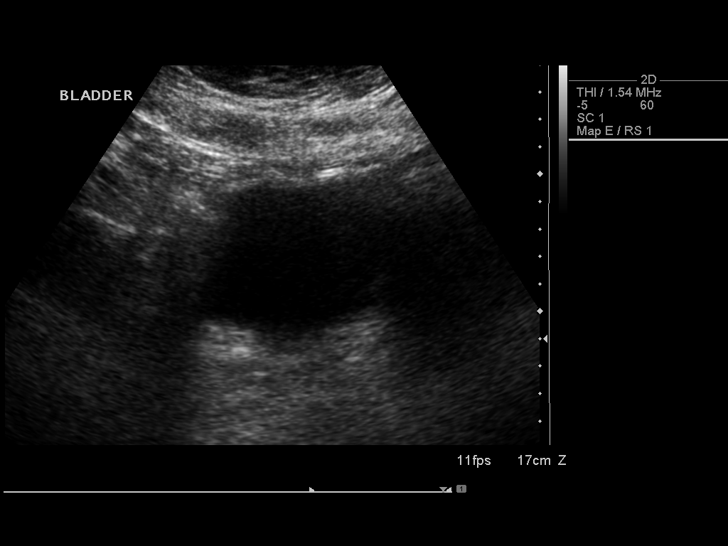
[im 19/36]
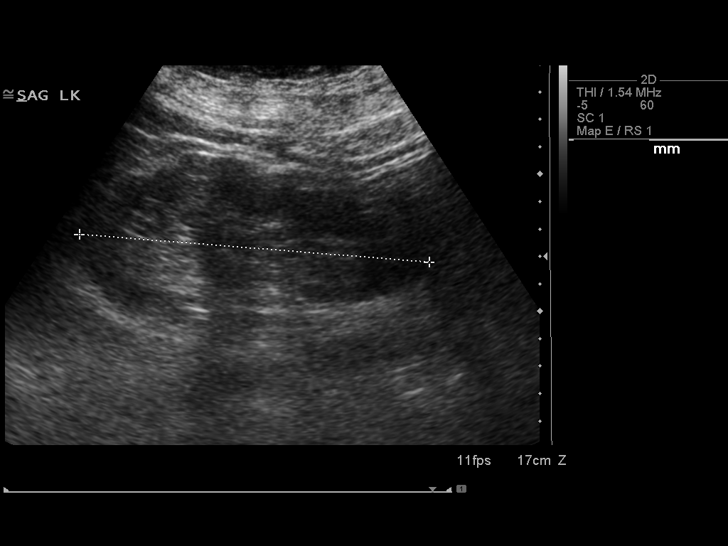
[im 22/36]
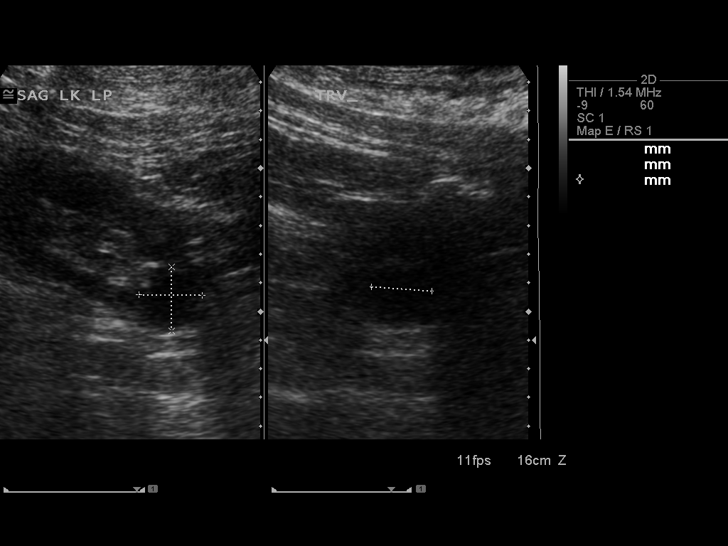
[im 24/36]
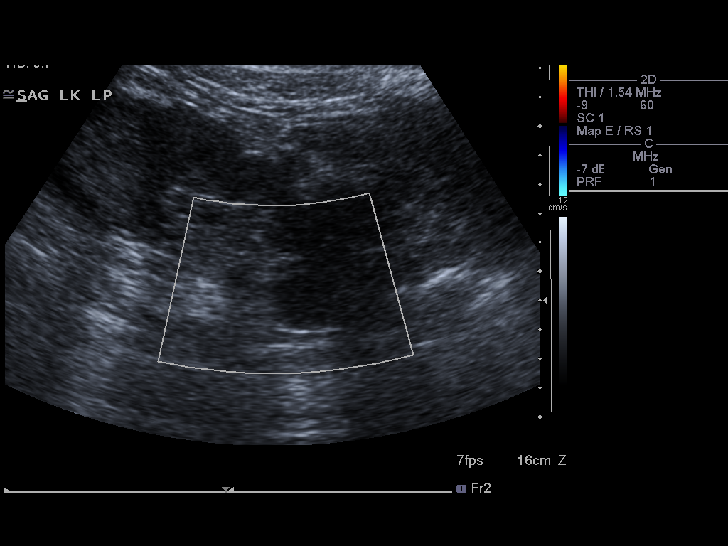
[im 27/36]
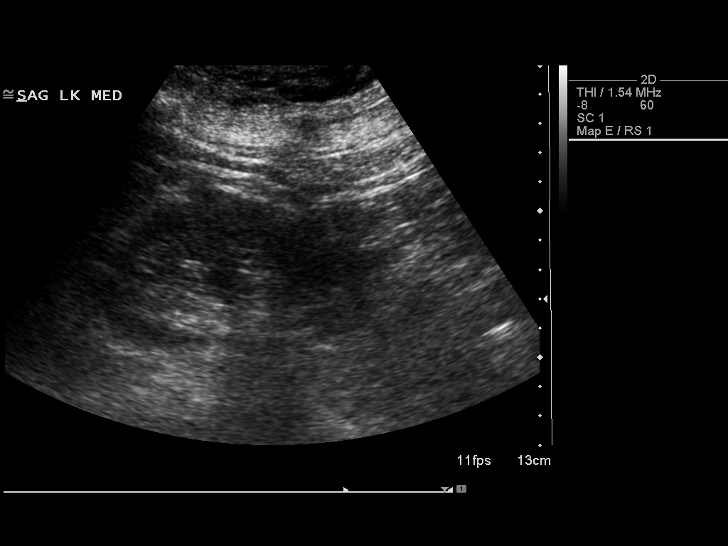
[im 30/36]
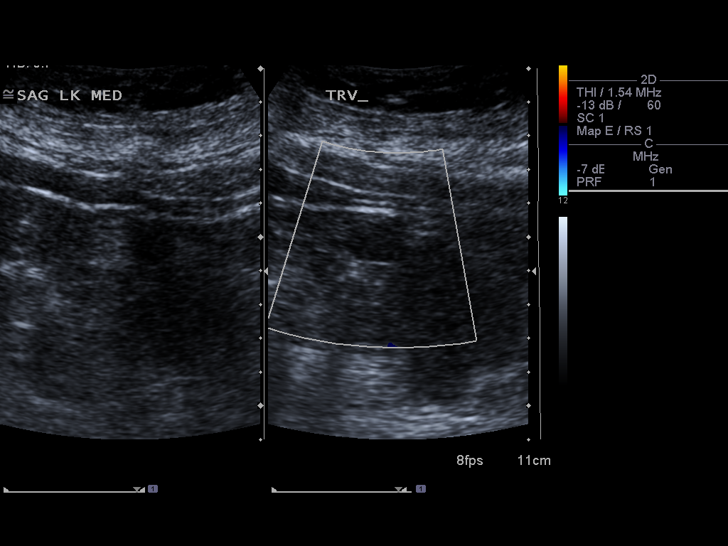
[im 33/36]
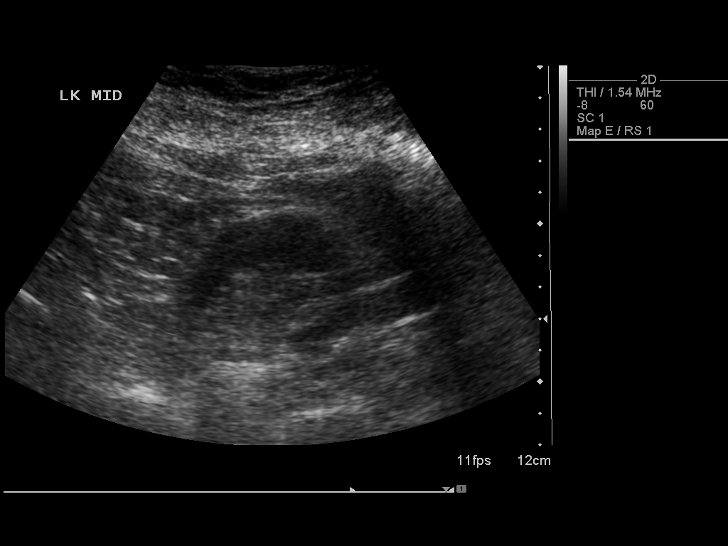
[im 36/36]
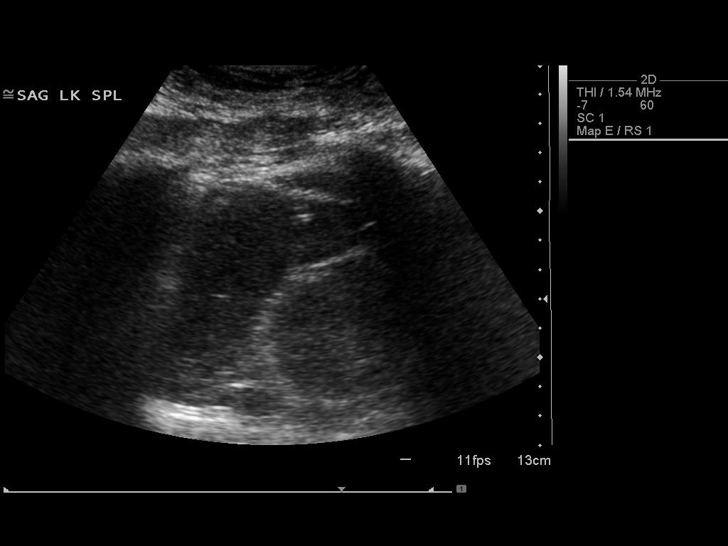

[14 of 25 positions shown; findings below may reference images not displayed]

FINDINGS: Right Kidney:

Length: 11.7 cm. Echogenicity is normal. No hydronephrosis. Lateral
midpole cyst is 2.2 x 1.9 x 1.9 cm.

Left Kidney:

Length: 12.8 cm. Echogenicity is normal. No hydronephrosis. Midpole
cyst is 1.4 x 1.4 x 1.4 cm. Lower pole cyst is 2.2 x 2.2 x 2.1 cm.

Bladder:

Appears normal for degree of bladder distention.

Study is technically degraded by patient body habitus.
IMPRESSION: 1. Bilateral renal cysts.
2. No hydronephrosis.

## 2017-05-06 MED ORDER — ONDANSETRON HCL 4 MG/2ML IJ SOLN
4.0000 mg | Freq: Once | INTRAMUSCULAR | Status: AC
  Administered 2017-05-06: 4 mg via INTRAVENOUS
  Filled 2017-05-06: qty 2

## 2017-05-06 MED ORDER — HYDROMORPHONE HCL 1 MG/ML IJ SOLN
1.0000 mg | Freq: Once | INTRAMUSCULAR | Status: AC
  Administered 2017-05-06: 1 mg via INTRAVENOUS
  Filled 2017-05-06: qty 1

## 2017-05-06 MED ORDER — SODIUM CHLORIDE 0.9 % IV BOLUS (SEPSIS)
1000.0000 mL | Freq: Once | INTRAVENOUS | Status: AC
  Administered 2017-05-06: 1000 mL via INTRAVENOUS

## 2017-05-06 MED ORDER — IOPAMIDOL (ISOVUE-300) INJECTION 61%
INTRAVENOUS | Status: AC
  Filled 2017-05-06: qty 30

## 2017-05-06 NOTE — ED Provider Notes (Signed)
[facility] DEPT Provider Note   CSN: [id number] Arrival date & time: 05/06/17  1524     History   Chief Complaint Chief Complaint  Patient presents with  . Abdominal Pain    HPI [name] [name] is a 66 y.o. female.  HPI   66 y.o. female with a hx of Anemia, CKD, DM, HTN, HLD, PVD, presents to the Emergency Department today due to lower abdominal pain with onset x 3-4 days ago. Notes bright red stool with clots present from rectum. States pain is 3/10 and isolated to LLQ. Pt notes hx hernia with mesh implanted several years ago. Denies emesis, but does endorse nausea. Able to tolerate PO. Pt with BMs with blood mixed in stool as well as out of stool. No black stools. Pain with BMs. No CP/SOB. No fevers. No URI symptoms. No hx diverticulitis. Denies dysuria. No vaginal bleeding/discharge. Pt is on Plavix for stroke prophylaxis. No other symptoms noted   Past Medical History:  Diagnosis Date  . Anemia   . Arthritis   . Asthma   . Bronchitis   . Chronic kidney disease   . Chronic kidney disease   . Chronic pain syndrome   . Cramping of feet   . Diabetes mellitus   . GERD (gastroesophageal reflux disease)   . Gout   . Headache   . History of hip replacement, total   . History of transfusion   . Hx MRSA infection    abscess left groin  . Hx of cardiac cath 06/2014  . Hyperlipidemia   . Hypertension   . Hypothyroid   . 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 Morbidly obese ([place])   . Peripheral vascular disease ([place])   . Positive cardiac stress test 12/2013   anterior and lateral ischemia on Myoview  . Sleep apnea    UNABLE TO TOLERATE C PAP  . Stress incontinence   . Thyroid disease   . TIA (transient ischemic attack)     X2 NO RESIDUAL PROBLEMS    Patient Active Problem List   Diagnosis Date Noted  . 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 Cellulitis of abdominal wall 03/21/2015  . Acute on  chronic renal failure ([place]) 03/21/2015  . Type 2 diabetes mellitus ([place]) 03/21/2015  . Abdominal wall abscess 03/21/2015  . 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 Low urine output 08/11/2014  . Chronic systolic CHF (congestive heart failure) ([place]) 08/11/2014  . Asthma, chronic 08/11/2014  . Hypothyroidism 08/11/2014  . OSA (obstructive sleep apnea) 08/11/2014  . Positive cardiac stress test 06/29/2014  . Hyperlipidemia 02/12/2014  . Left arm weakness 12/23/2013  . TIA (transient ischemic attack) 12/23/2013  . Left buttock abscess 10/12/2013  . Cellulitis and abscess of leg, right 04/16/2013  . Acute blood loss anemia 02/14/2013  . HTN (hypertension) 02/14/2013  . Cellulitis and abscess of buttock 02/09/2013  . Morbid obesity ([place]) 02/03/2013  . ARF (acute renal failure) ([place]) 02/03/2013  . Diarrhea 02/03/2013  . Hyponatremia 02/03/2013  . Hypokalemia 02/03/2013  . Sepsis ([place]) 02/02/2013  . DM (diabetes mellitus) ([place]) 02/02/2013  . CKD (chronic kidney disease), stage III 02/02/2013  . Cellulitis 02/02/2013  . PAD (peripheral artery disease) ([place]) 04/26/2011  . Tobacco abuse 04/26/2011    Past Surgical History:  Procedure Laterality Date  . ABDOMINAL HYSTERECTOMY    . COLON SURGERY  1995   DUE TO POLYP  . FOREARM FRACTURE SURGERY  Left arm  . HERNIA REPAIR     w/ mesh  . INCISION AND DRAINAGE ABSCESS Left 02/04/2013   Procedure: INCISION AND DRAINAGE LEFT BUTTOCK ABSCESS; INCISION AND DRAINAGE LEFT BREAST ABSCESS;  Surgeon: Harl Bowie, MD;  Location: [facility];  Service: General;  Laterality: Left;  . INCISION AND DRAINAGE ABSCESS N/A 02/12/2013   Procedure: INCISION AND DEBRIDEMENT BUTTOCK WOUND ;  Surgeon: [name]. Georgette Dover, MD;  Location: [facility];  Service: General;  Laterality: N/A;  . INCISION AND DRAINAGE ABSCESS N/A 02/14/2013   Procedure: INCISION AND DRAINAGE/DRESSING CHANGE;  Surgeon: Harl Bowie, MD;  Location: [facility];  Service: General;   Laterality: N/A;  . INCISION AND DRAINAGE ABSCESS N/A 03/21/2015   Procedure: INCISION AND DRAINAGE PUBIC ABSCESS;  Surgeon: Excell Seltzer, MD;  Location: [facility];  Service: General;  Laterality: N/A;  . INCISION AND DRAINAGE PERIRECTAL ABSCESS Left 02/10/2013   Procedure: IRRIGATION AND DEBRIDEMENT OF BUTTOCK/PERINEAL ABSCESS;  Surgeon: [name]. Georgette Dover, MD;  Location: [facility];  Service: General;  Laterality: Left;  . INCISION AND DRAINAGE PERIRECTAL ABSCESS N/A 02/16/2013   Procedure: IRRIGATION AND DEBRIDEMENT PERINEAL ABSCESS;  Surgeon: Zenovia Jarred, MD;  Location: [facility];  Service: General;  Laterality: N/A;  . IRRIGATION AND DEBRIDEMENT ABSCESS N/A 02/06/2013   Procedure: IRRIGATION AND DEBRIDEMENT BUTTOCK ABSCESS AND DRESSING CHANGE;  Surgeon: Harl Bowie, MD;  Location: [facility];  Service: General;  Laterality: N/A;  . IRRIGATION AND DEBRIDEMENT ABSCESS Left 02/08/2013   Procedure: IRRIGATION AND DEBRIDEMENT ABSCESS/DRESSING CHANGE;  Surgeon: Gwenyth Ober, MD;  Location: [facility];  Service: General;  Laterality: Left;  . JOINT REPLACEMENT  2010 / 2012   . LAPAROSCOPIC CHOLECYSTECTOMY    . LEFT HEART CATHETERIZATION WITH CORONARY ANGIOGRAM N/A 07/02/2014   Procedure: LEFT HEART CATHETERIZATION WITH CORONARY ANGIOGRAM;  Surgeon: Lorretta Harp, MD;  Location: [facility] C[facility];  Service: Cardiovascular;  Laterality: N/A;  . LOWER EXTREMITY ANGIOGRAM Right 04/20/2016   Procedure: Lower Extremity Angiogram;  Surgeon: Elam Dutch, MD;  Location: [facility] CV LAB;  Service: Cardiovascular;  Laterality: Right;  . PERIPHERAL VASCULAR CATHETERIZATION N/A 04/20/2016   Procedure: Abdominal Aortogram;  Surgeon: Elam Dutch, MD;  Location: [facility];  Service: Cardiovascular;  Laterality: N/A;  . PERIPHERAL VASCULAR CATHETERIZATION Right 04/20/2016   Procedure: Peripheral Vascular Intervention;  Surgeon: Elam Dutch, MD;  Location: [facility];  Service: Cardiovascular;   Laterality: Right;  popiteal  . THYROIDECTOMY    . TOTAL HIP REVISION Right 08/11/2014   Procedure: RIGHT ACETABULAR REVISION;  Surgeon: Gearlean Alf, MD;  Location: [facility];  Service: Orthopedics;  Laterality: Right;    OB History    No data available       Home Medications    Prior to Admission medications   Medication Sig Start Date End Date Taking? Authorizing Provider  albuterol (PROVENTIL) (2.5 MG/3ML) 0.083% nebulizer solution Take 2.5 mg by nebulization every 6 (six) hours as needed for wheezing or shortness of breath.    [provider]  allopurinol (ZYLOPRIM) 100 MG tablet Take 100 mg by mouth every morning.     [provider]  B Complex Vitamins (VITAMIN B COMPLEX PO) Take 1 tablet by mouth 2 (two) times daily.     [provider]  budesonide-formoterol (SYMBICORT) 160-4.5 MCG/ACT inhaler Inhale 2 puffs into the lungs 2 (two) times daily as needed (for SOB).     [provider]  cetirizine (ZYRTEC) 10 MG tablet Take 10 mg by mouth daily as needed for allergies.  03/14/15   [provider]  clopidogrel (PLAVIX) 75 MG tablet Take 1 tablet (75 mg total) by mouth daily. 04/21/16   Virgina Jock A, PA-C  diazepam (VALIUM) 5 MG tablet Take 5 mg by mouth daily as needed. 03/31/16   [provider]  Dulaglutide (TRULICITY) 6.83 MH/9.6QI SOPN Inject 0.75 mg into the skin every Monday.     [provider]  esomeprazole (NEXIUM) 40 MG capsule Take 40 mg by mouth daily.     [provider]  fenofibrate (TRICOR) 145 MG tablet TAKE 1 TABLET (145 MG TOTAL) BY MOUTH DAILY. 05/11/15   Lorretta Harp, MD  fluticasone (FLONASE) 50 MCG/ACT nasal spray Place 2 sprays into the nose as needed for allergies.     [provider]  furosemide (LASIX) 40 MG tablet Take 120 mg by mouth daily.  03/31/15   [provider]  furosemide (LASIX) 40 MG tablet Take 180 mg by mouth 2 (two) times daily.    [provider]  insulin aspart protamine- aspart (NOVOLOG MIX 70/30) (70-30) 100 UNIT/ML injection Inject 50 Units into the skin 2 (two) times daily with a meal.     [provider]  insulin lispro protamine-lispro (HUMALOG 75/25 MIX) (75-25) 100 UNIT/ML SUSP injection Inject 50 Units into the skin 2 (two) times daily with a meal.    [provider]  levothyroxine (SYNTHROID, LEVOTHROID) 150 MCG tablet Take 150 mcg by mouth daily before breakfast.    [provider]  mirabegron ER (MYRBETRIQ) 50 MG TB24 tablet Take 50 mg by mouth daily.    [provider]  Multiple Vitamin (MULTIVITAMIN WITH MINERALS) TABS tablet Take 1 tablet by mouth daily.    [provider]  naftifine (NAFTIN) 1 % cream APPLY TO THE AFFECTED AREA ON SKIN EVERY DAY Patient taking differently: APPLY TO THE AFFECTED AREA ON SKIN AS NEEDED FOR IRRITATION 05/23/15   Hyatt, Max T, DPM  nortriptyline (PAMELOR) 10 MG capsule Take 10 mg by mouth at bedtime.    [provider]  oxyCODONE-acetaminophen (PERCOCET) 10-325 MG tablet Take 1 tablet by mouth every 4 (four) hours as needed for pain.    [provider]  polyvinyl alcohol (LIQUIFILM TEARS) 1.4 % ophthalmic solution Place 2 drops into both eyes daily as needed for dry eyes.    [provider]  potassium chloride SA (K-DUR,KLOR-CON) 20 MEQ tablet Take 40 mEq by mouth 2 (two) times daily.     [provider]  SIMVASTATIN PO Take by mouth daily.    [provider]  tiZANidine (ZANAFLEX) 4 MG capsule Take 4 mg by mouth 2 (two) times daily.     [provider]    Family History Family History  Problem Relation Age of Onset  . Diabetes Brother   . Cardiomyopathy Mother   . Heart disease Mother   . Cancer Father     Social History Social History  Substance Use Topics  . Smoking status: Current Every Day Smoker    Packs/day: 0.50    Years: 40.00    Types: Cigarettes  . Smokeless tobacco:  Never Used  . Alcohol use 0.6 oz/week    1 Standard drinks or equivalent per week     Comment: OCC   Allergies   Nebivolol; Ace inhibitors; Bystolic [nebivolol hcl]; and Morphine and related   Review of Systems Review of  Systems ROS reviewed and all are negative for acute change except as noted in the HPI.  Physical Exam Updated Vital Signs BP (!) 156/82 (BP Location: Left Arm)   Pulse 91   Temp 97.9 F (36.6 C) (Oral)   Resp 15   Ht 5\' 4"  (1.626 m)   Wt 129.3 kg (285 lb)   SpO2 98%   BMI 48.92 kg/m   Physical Exam  Constitutional: She is oriented to person, place, and time. Vital signs are normal. She appears well-developed and well-nourished. No distress.  Morbidly obese  HENT:  Head: Normocephalic and atraumatic.  Right Ear: Hearing, tympanic membrane, external ear and ear canal normal.  Left Ear: Hearing, tympanic membrane, external ear and ear canal normal.  Nose: Nose normal.  Mouth/Throat: Uvula is midline, oropharynx is clear and moist and mucous membranes are normal. No trismus in the jaw. No oropharyngeal exudate, posterior oropharyngeal erythema or tonsillar abscesses.  Eyes: Pupils are equal, round, and reactive to light. Conjunctivae and EOM are normal.  Neck: Normal range of motion. Neck supple. No tracheal deviation present.  Cardiovascular: Normal rate, regular rhythm, S1 normal, S2 normal, normal heart sounds, intact distal pulses and normal pulses.   Pulmonary/Chest: Effort normal and breath sounds normal. No respiratory distress. She has no decreased breath sounds. She has no wheezes. She has no rhonchi. She has no rales.  Abdominal: Soft. Normal appearance and bowel sounds are normal. There is tenderness in the left lower quadrant. There is no rigidity, no rebound, no guarding, no CVA tenderness, no tenderness at McBurney's point and negative Murphy's sign. A hernia is present.  Possible hernia palpable in LLQ. No discoloration. TTP. Difficult to discern  given body habitus.   Genitourinary:  Genitourinary Comments: Chaperone was present. Patient with pain around the rectal area. There are no external fissures noted. No induration of the skin or swelling.External hemorrhoids seen. Non thrombosed. Patient able to tolerate examination. I was able to feel the first 2-3cm of the rectum digitally without gross abnormality. There is no gross blood.  No signs of perirectal abscess.  Musculoskeletal: Normal range of motion.  Neurological: She is alert and oriented to person, place, and time.  Skin: Skin is warm and dry.  Psychiatric: She has a normal mood and affect. Her speech is normal and behavior is normal. Thought content normal.  Nursing note and vitals reviewed.  ED Treatments / Results  Labs (all labs ordered are listed, but only abnormal results are displayed) Labs Reviewed  LIPASE, BLOOD - Abnormal; Notable for the following:       Result Value   Lipase 91 (*)    All other components within normal limits  COMPREHENSIVE METABOLIC PANEL - Abnormal; Notable for the following:    Potassium 3.2 (*)    Chloride 97 (*)    Glucose, Bld 191 (*)    BUN 91 (*)    Creatinine, Ser 2.44 (*)    Albumin 3.3 (*)    GFR calc non Af Amer 20 (*)    GFR calc Af Amer 23 (*)    All other components within normal limits  CBC - Abnormal; Notable for the following:    WBC 11.8 (*)    Hemoglobin 11.1 (*)    RDW 15.6 (*)    All other components within normal limits  URINALYSIS, ROUTINE W REFLEX MICROSCOPIC - Abnormal; Notable for the following:    APPearance HAZY (*)    Glucose, UA 150 (*)    All  other components within normal limits  CBC - Abnormal; Notable for the following:    WBC 13.4 (*)    All other components within normal limits  POC OCCULT BLOOD, ED - Abnormal; Notable for the following:    Fecal Occult Bld POSITIVE (*)    All other components within normal limits    EKG  EKG Interpretation None      Radiology Ct Abdomen Pelvis Wo  Contrast  Result Date: 05/07/2017 CLINICAL DATA:  66 year old female with left lower quadrant abdominal pain and rectal bleeding. EXAM: CT ABDOMEN AND PELVIS WITHOUT CONTRAST TECHNIQUE: Multidetector CT imaging of the abdomen and pelvis was performed following the standard protocol without IV contrast. COMPARISON:  Abdominal CT dated 02/02/2013 FINDINGS: Evaluation of this exam is limited in the absence of intravenous contrast. Lower chest: The visualized lung bases are clear. No intra-abdominal free air or free fluid. Hepatobiliary: There is diffuse fatty infiltration of the liver. No intrahepatic biliary ductal dilatation. Cholecystectomy. Pancreas: Unremarkable. No pancreatic ductal dilatation or surrounding inflammatory changes. Spleen: Normal in size without focal abnormality. Adrenals/Urinary Tract: There is thickened appearance of the left adrenal gland as seen on the prior CT which may represent adrenal thickening/ hyperplasia. The right adrenal gland is unremarkable. There is mild bilateral renal parenchymal atrophy. Multiple small renal hypodense lesions as seen on the prior CT. These lesions are not characterized on this noncontrast CT. Ultrasound may provide better characterization if clinically indicated. Punctate nonobstructing left renal mid to lower pole calculi may be present. There is no hydronephrosis or obstructing stone on either side. The visualized ureters appear unremarkable. The urinary bladder is grossly unremarkable. Stomach/Bowel: This postsurgical changes of partial sigmoid resection with anastomotic suture. Small scattered colonic diverticula noted without active inflammatory changes. There is no evidence of bowel obstruction or active inflammation. Normal appendix. Vascular/Lymphatic: There is moderate aortoiliac atherosclerotic disease. Evaluation of the abdominal aorta and IVC is very limited on this noncontrast study. No portal venous gas identified. There is no adenopathy.  Reproductive: Hysterectomy. Other: None Musculoskeletal: There is osteopenia with degenerative changes of the spine. There is a total right hip arthroplasty. There is moderate osteoarthritic changes of the left hip. No acute fracture. IMPRESSION: 1. No acute intra-abdominal or pelvic pathology. No bowel obstruction or active inflammation. Normal appendix. 2. Scattered colonic diverticula without active inflammatory changes. 3. Fatty liver. 4. No hydronephrosis or obstructing stone. 5.  Aortic Atherosclerosis (ICD10-I70.0). Electronically Signed   By: Anner Crete M.D.   On: 05/07/2017 00:07    Procedures Procedures (including critical care time)  Medications Ordered in ED Medications  iopamidol (ISOVUE-300) 61 % injection (not administered)  sodium chloride 0.9 % bolus 1,000 mL (1,000 mLs Intravenous New Bag/Given 05/06/17 2223)  ondansetron (ZOFRAN) injection 4 mg (4 mg Intravenous Given 05/06/17 2223)  HYDROmorphone (DILAUDID) injection 1 mg (1 mg Intravenous Given 05/06/17 2223)   Initial Impression / Assessment and Plan / ED Course  I have reviewed the triage vital signs and the nursing notes.  Pertinent labs & imaging results that were available during my care of the patient were reviewed by me and considered in my medical decision making (see chart for details).  Final Clinical Impressions(s) / ED Diagnoses  {I have reviewed and evaluated the relevant laboratory values. {I have reviewed and evaluated the relevant imaging studies.  {I have reviewed the relevant previous healthcare records.   {I obtained HPI from historian. {Patient discussed with supervising physician.  ED Course:  Assessment: Pt is a  66 y.o. female with a hx of Anemia, CKD, DM, HTN, HLD, PVD, presents to the Emergency Department today due to lower abdominal pain with onset x 3-4 days ago. Notes bright red stool with clots present from rectum. States pain is 3/10 and isolated to LLQ. Pt notes hx hernia with mesh  implanted several years ago. Denies emesis, but does endorse nausea. Able to tolerate PO. Pt with BMs with blood mixed in stool as well as out of stool. Pain with BMs. No CP/SOB. No fevers. No URI symptoms. No hx diverticulitis. Denies dysuria. No vaginal bleeding/discharge. Pt is on Plavix for stroke prophylaxis. On exam, pt in NAD. Nontoxic/nonseptic appearing. VSS. Afebrile. Lungs CTA. Heart RRR. Abdomen TTP LLQ. Possible hernia, but difficult to discern given body habitus. No discoloration. Labs with mild leukocytosis. CBC stable at 11.1. Repeat with improved Hgb. Creatinine baseline CKD. BUN elevated with suspicion for bleed. POC stool positive. DRE with external hemorrhoid seen. Non thrombosed. Not gross blood on exam. No melena. Anal fissure is possible due to pain with examination, but did not appreciate any findings. Given analgesia. Due to exam, will further evaluate for possible hernia. Clinically does not appear incarcerated or strangulated. Attempted to reduce in ED withotu success given body habitus. Will eval further for possible diverticulitis given rectal bleeding as well as leukocytosis. CT Abdomen without contrast pending. Given analgesia and antiemetics in ED. Dispo is pending CT imaging. Anticipate DC home with follow up  with Gastroenterology for Colonoscopy and further evaluation.   12:16 AM- CT negative for hernia. Likely body habitus. Negative for diverticulitis. Diverticula noted. Will DC patient home with follow up to Gastroenterology for outpatient colonoscopy. At time of discharge, Patient is in no acute distress. Vital Signs are stable. Patient is able to ambulate. Patient able to tolerate PO.   Disposition/Plan:  DC Home Strict return precautions given Pt acknowledges and agrees with plan  Supervising Physician Mesner, Corene Cornea, MD  Final diagnoses:  Rectal bleeding  External hemorrhoid    New Prescriptions New Prescriptions   No medications on file     Conni Slipper 05/07/17 0021    Mesner, Corene Cornea, MD 05/07/17 [phone]

## 2017-05-06 NOTE — ED Notes (Signed)
ED Provider at bedside. 

## 2017-05-06 NOTE — ED Triage Notes (Signed)
Pt to ER with complaint of lower abdominal pain onset 3-4 days ago with bright red blood clots per rectum, per patient. States is on plavix. States nausea without vomiting. VSS. A/o x4.

## 2017-05-07 DIAGNOSIS — K644 Residual hemorrhoidal skin tags: Secondary | ICD-10-CM | POA: Diagnosis not present

## 2017-05-07 NOTE — ED Notes (Signed)
Pt understood dc material. NAD noted. 

## 2017-05-07 NOTE — Discharge Instructions (Signed)
Please read and follow all provided instructions.  Your diagnoses today include:  1. Rectal bleeding   2. External hemorrhoid     Tests performed today include: Blood counts and electrolytes Blood tests to check liver and kidney function Blood tests to check pancreas function Urine test to look for infection and pregnancy (in women) Vital signs. See below for your results today.   Medications prescribed:   Take any prescribed medications only as directed.  Home care instructions:  Follow any educational materials contained in this packet.  Follow-up instructions: Please follow-up with your primary care provider in the next 2 days for further evaluation of your symptoms.    Return instructions:  SEEK IMMEDIATE MEDICAL ATTENTION IF: The pain does not go away or becomes severe  A temperature above 101F develops  Repeated vomiting occurs (multiple episodes)  The pain becomes localized to portions of the abdomen. The right side could possibly be appendicitis. In an adult, the left lower portion of the abdomen could be colitis or diverticulitis.  Blood is being passed in stools or vomit (bright red or black tarry stools)  You develop chest pain, difficulty breathing, dizziness or fainting, or become confused, poorly responsive, or inconsolable (young children) If you have any other emergent concerns regarding your health  Additional Information: Abdominal (belly) pain can be caused by many things. Your caregiver performed an examination and possibly ordered blood/urine tests and imaging (CT scan, x-rays, ultrasound). Many cases can be observed and treated at home after initial evaluation in the emergency department. Even though you are being discharged home, abdominal pain can be unpredictable. Therefore, you need a repeated exam if your pain does not resolve, returns, or worsens. Most patients with abdominal pain don't have to be admitted to the hospital or have surgery, but serious  problems like appendicitis and gallbladder attacks can start out as nonspecific pain. Many abdominal conditions cannot be diagnosed in one visit, so follow-up evaluations are very important.  Your vital signs today were: BP (!) 145/83    Pulse 80    Temp 97.9 F (36.6 C) (Oral)    Resp 15    Ht 5\' 4"  (1.626 m)    Wt 129.3 kg (285 lb)    SpO2 98%    BMI 48.92 kg/m  If your blood pressure (bp) was elevated above 135/85 this visit, please have this repeated by your doctor within one month. --------------

## 2017-05-22 ENCOUNTER — Other Ambulatory Visit: Payer: Self-pay | Admitting: Internal Medicine

## 2017-05-22 DIAGNOSIS — N63 Unspecified lump in unspecified breast: Secondary | ICD-10-CM

## 2017-06-11 ENCOUNTER — Other Ambulatory Visit: Payer: Self-pay | Admitting: Vascular Surgery

## 2017-06-14 ENCOUNTER — Other Ambulatory Visit: Payer: Self-pay | Admitting: *Deleted

## 2017-06-14 MED ORDER — CLOPIDOGREL BISULFATE 75 MG PO TABS: 75.0000 mg | ORAL_TABLET | Freq: Every day | ORAL | 11 refills | Status: DC

## 2017-07-04 ENCOUNTER — Other Ambulatory Visit: Payer: Medicare Other

## 2017-07-09 ENCOUNTER — Ambulatory Visit
Admission: RE | Admit: 2017-07-09 | Discharge: 2017-07-09 | Disposition: A | Discharge status: Home / Self Care | Payer: Medicare Other | Source: Ambulatory Visit | Attending: Internal Medicine | Admitting: Internal Medicine

## 2017-07-09 ENCOUNTER — Other Ambulatory Visit: Payer: Self-pay | Admitting: Internal Medicine

## 2017-07-09 DIAGNOSIS — N63 Unspecified lump in unspecified breast: Secondary | ICD-10-CM

## 2017-07-24 ENCOUNTER — Ambulatory Visit (INDEPENDENT_AMBULATORY_CARE_PROVIDER_SITE_OTHER): Payer: Medicare Other | Admitting: Cardiovascular Disease

## 2017-07-24 ENCOUNTER — Encounter: Payer: Self-pay | Admitting: Cardiovascular Disease

## 2017-07-24 DIAGNOSIS — E7800 Pure hypercholesterolemia, unspecified: Secondary | ICD-10-CM

## 2017-07-24 DIAGNOSIS — Z720 Tobacco use: Secondary | ICD-10-CM

## 2017-07-24 DIAGNOSIS — R9439 Abnormal result of other cardiovascular function study: Secondary | ICD-10-CM | POA: Diagnosis not present

## 2017-07-24 NOTE — Assessment & Plan Note (Addendum)
History of continued tobacco abuse of one third of a pack a day recalcitrant to modification.

## 2017-07-24 NOTE — Addendum Note (Signed)
Addended by: Zebedee Iba on: 07/24/2017 04:39 PM   Modules accepted: Orders

## 2017-07-24 NOTE — Progress Notes (Signed)
07/24/2017 [name] [name]   1951-04-07  [id number]  Primary Physician [name], MD Primary Cardiologist: Lorretta Harp MD Lupe Carney, [place]  HPI:  [name] [name] is a 66 y.o. female severely overweight married African American female mother of one son, grandmother of 2 grandchildren referred for cardiovascular evaluation because of multiple cardiac risk factors and recent TIAs. Her neurologist is Dr. Krista Blue and her primary care physician is Dr. Shawna Orleans. I last saw her in the office 06/29/14. Her cardiovascular risk factor profile is notable for 50 pack years of tobacco abuse continuing to smoke 7 cigarettes a day. Her mother died of a myocardial function age 45. She has treated hypertension, diabetes and hyperlipidemia. She has never had a heart attack. She apparently said to TIAs which resolved and has had a neurologic workup by Dr. Krista Blue. Her carotid Dopplers were unremarkable. And the monitor showed PVCs. A 2-D echo showed low normal LV function with an ejection fraction of 45-50% and question of a lateral wall motion abnormality. The patient was put on Plavix prophylactically. She had a Myoview stress test performed on 06/24/14 which was read as a high risk with anterolateral ischemia. She denies chest pain. She was scheduled for elective total hip replacement in November 2015. Because of this she underwent outpatient diagnostic were angiography by myself 07/02/14. The right radial approach revealing essentially normal coronary arteries and moderate LV dysfunction consistent with a nonischemic cardiomyopathy. She scheduled for elective colonoscopy in the near future.  Current Meds  Medication Sig  . albuterol (PROVENTIL) (2.5 MG/3ML) 0.083% nebulizer solution Take 2.5 mg by nebulization every 6 (six) hours as needed for wheezing or shortness of breath.  . allopurinol (ZYLOPRIM) 100 MG tablet Take 100 mg by mouth every morning.   . B Complex Vitamins (VITAMIN B COMPLEX PO) Take 1  tablet by mouth 2 (two) times daily.   . budesonide-formoterol (SYMBICORT) 160-4.5 MCG/ACT inhaler Inhale 2 puffs into the lungs 2 (two) times daily as needed (for SOB).   . cetirizine (ZYRTEC) 10 MG tablet Take 10 mg by mouth daily as needed for allergies.   Marland Kitchen clopidogrel (PLAVIX) 75 MG tablet TAKE 1 TABLET BY MOUTH EVERY DAY  . Dulaglutide (TRULICITY) 7.98 XQ/1.1HE SOPN Inject 1.5 mg into the skin every Monday.   . fenofibrate (TRICOR) 145 MG tablet TAKE 1 TABLET (145 MG TOTAL) BY MOUTH DAILY.  . fluticasone (FLONASE) 50 MCG/ACT nasal spray Place 2 sprays into the nose as needed for allergies.   . furosemide (LASIX) 80 MG tablet Take 160 mg by mouth 2 (two) times daily.  . insulin lispro protamine-lispro (HUMALOG 75/25 MIX) (75-25) 100 UNIT/ML SUSP injection Inject 70 Units into the skin 2 (two) times daily with a meal.   . levothyroxine (SYNTHROID, LEVOTHROID) 150 MCG tablet Take 150 mcg by mouth daily before breakfast.  . mirabegron ER (MYRBETRIQ) 50 MG TB24 tablet Take 50 mg by mouth daily.  . Multiple Vitamin (MULTIVITAMIN WITH MINERALS) TABS tablet Take 1 tablet by mouth daily.  . naftifine (NAFTIN) 1 % cream APPLY TO THE AFFECTED AREA ON SKIN EVERY DAY (Patient taking differently: APPLY TO THE AFFECTED AREA ON SKIN AS NEEDED FOR IRRITATION)  . oxyCODONE-acetaminophen (PERCOCET) 10-325 MG tablet Take 1 tablet by mouth every 4 (four) hours as needed for pain.  . polyvinyl alcohol (LIQUIFILM TEARS) 1.4 % ophthalmic solution Place 2 drops into both eyes daily as needed for dry eyes.  . potassium chloride SA (K-DUR,KLOR-CON) 20 MEQ  tablet Take 40 mEq by mouth 2 (two) times daily.   . simvastatin (ZOCOR) 20 MG tablet Take 20 mg by mouth at bedtime.  Marland Kitchen tiZANidine (ZANAFLEX) 4 MG capsule Take 4 mg by mouth 2 (two) times daily.      Allergies  Allergen Reactions  . Nebivolol Swelling  . Ace Inhibitors Swelling and Other (See Comments)    Tongue swell  . Bystolic [Nebivolol Hcl] Swelling and  Other (See Comments)    Chest pain   . Morphine And Related Itching    Social History   Social History  . Marital status: Legally Separated    Spouse name: N/A  . Number of children: 1  . Years of education: college   Occupational History  .      Disabled   Social History Main Topics  . Smoking status: Current Every Day Smoker    Packs/day: 0.50    Years: 40.00    Types: Cigarettes  . Smokeless tobacco: Never Used  . Alcohol use 0.6 oz/week    1 Standard drinks or equivalent per week     Comment: OCC  . Drug use: No     Comment: Quit four years ago.  Marland Kitchen Sexual activity: Not on file   Other Topics Concern  . Not on file   Social History Narrative   Patient is se prated  and lives at home with her friend. Patient is disabled.   Education one year of college   Right handed   Caffeine two cups daily.           Review of Systems: General: negative for chills, fever, night sweats or weight changes.  Cardiovascular: negative for chest pain, dyspnea on exertion, edema, orthopnea, palpitations, paroxysmal nocturnal dyspnea or shortness of breath Dermatological: negative for rash Respiratory: negative for cough or wheezing Urologic: negative for hematuria Abdominal: negative for nausea, vomiting, diarrhea, bright red blood per rectum, melena, or hematemesis Neurologic: negative for visual changes, syncope, or dizziness All other systems reviewed and are otherwise negative except as noted above.    Blood pressure (!) 150/78, pulse 85, height 5\' 4"  (1.626 m), weight 278 lb 12.8 oz (126.5 kg).  General appearance: alert and no distress Neck: no adenopathy, no carotid bruit, no JVD, supple, symmetrical, trachea midline and thyroid not enlarged, symmetric, no tenderness/mass/nodules Lungs: clear to auscultation bilaterally Heart: regular rate and rhythm, S1, S2 normal, no murmur, click, rub or gallop Extremities: extremities normal, atraumatic, no cyanosis or edema Pulses:  Absent pedal pulses Skin: Skin color, texture, turgor normal. No rashes or lesions Neurologic: Alert and oriented X 3, normal strength and tone. Normal symmetric reflexes. Normal coordination and gait  EKG sinus rhythm at 85 with left bundle branch block. I personally reviewed this EKG.  ASSESSMENT AND PLAN:   Tobacco abuse History of continued tobacco abuse of one third of a pack a day recalcitrant to modification.  Hyperlipidemia History of hyperlipidemia on statin therapy followed by her PCP  Positive cardiac stress test History of abnormal stress test performed 06/23/14 suggesting reversible anterior ischemia. This did lead to an outpatient cardiac catheterization by myself 07/02/14 that showed essentially normal coronary arteries and EF of 40-45% suggesting a nonischemic cardiomyopathy and a false positive stress test.      Lorretta Harp MD [facility] 07/24/2017 11:25 AM

## 2017-07-24 NOTE — Patient Instructions (Signed)
Medication Instructions: Your physician recommends that you continue on your current medications as directed. Please refer to the Current Medication list given to you today.  Follow-Up: Your physician recommends that you schedule a follow-up appointment as needed with Dr. Gwenlyn Found.  I will fax your clearance letter to Dr. Dr. Edwyna Shell office.

## 2017-07-24 NOTE — Assessment & Plan Note (Signed)
History of hyperlipidemia on statin therapy followed by her PCP. 

## 2017-07-24 NOTE — Assessment & Plan Note (Signed)
History of abnormal stress test performed 06/23/14 suggesting reversible anterior ischemia. This did lead to an outpatient cardiac catheterization by myself 07/02/14 that showed essentially normal coronary arteries and EF of 40-45% suggesting a nonischemic cardiomyopathy and a false positive stress test.

## 2017-08-02 ENCOUNTER — Telehealth: Payer: Self-pay | Admitting: Cardiovascular Disease

## 2017-08-02 NOTE — Telephone Encounter (Signed)
Patient reports that she has taken her Plavix today. As stated, we need her to hold this medication for 5 days prior to procedure. Advised patient of this. Returned call to [employer]. Advised the staff of this as well. They will be sending a message to her MD for further recommendations.

## 2017-08-02 NOTE — Telephone Encounter (Signed)
Chart reviewed, she was cleared by Dr. Gwenlyn Found on 07/24/2017 to proceed with surgery. History of nonischemic cardiomyopathy, abnormal myoview but normal coronaries on cath. She is on plavix for TIA?  She will need to hold plavix for 5 days prior to colonoscopy. If she has taken her plavix this morning and her procedure is scheduled on 10/30, her procedure may need to be pushed back for 1-2 days, if she has not taken her plavix, she should be fine going through the colonoscopy. She will need to restart afterward as soon as deemed low bleeding risk by GI  Signed, Almyra Deforest PA Pager: [phone]

## 2017-08-02 NOTE — Telephone Encounter (Signed)
New message   Alyse Low @ [facility] [phone] Needs clarification on what date to stop Plavix, procedure on 10/30. Clearance says low risk, but did not give stop date of medication. Please call.

## 2017-08-02 NOTE — Telephone Encounter (Signed)
[email or web address]

## 2017-08-06 NOTE — Telephone Encounter (Signed)
Please let surgeon know to hold Plavix 5 days prior to colonoscopy and close encounter.

## 2017-08-07 NOTE — Telephone Encounter (Signed)
Called to instruct Alyse Low at [facility] that patient needs to hold Plavix 5 days prior to procedure. She is agreeable

## 2017-09-13 ENCOUNTER — Ambulatory Visit (HOSPITAL_COMMUNITY)
Admission: RE | Admit: 2017-09-13 | Discharge: 2017-09-13 | Disposition: A | Discharge status: Home / Self Care | Payer: Medicare Other | Source: Ambulatory Visit | Attending: Vascular Surgery | Admitting: Vascular Surgery

## 2017-09-13 ENCOUNTER — Ambulatory Visit (INDEPENDENT_AMBULATORY_CARE_PROVIDER_SITE_OTHER): Payer: Medicare Other | Admitting: Family

## 2017-09-13 ENCOUNTER — Encounter: Payer: Self-pay | Admitting: Family

## 2017-09-13 ENCOUNTER — Ambulatory Visit (INDEPENDENT_AMBULATORY_CARE_PROVIDER_SITE_OTHER)
Admission: RE | Admit: 2017-09-13 | Discharge: 2017-09-13 | Disposition: A | Discharge status: Home / Self Care | Payer: Medicare Other | Source: Ambulatory Visit | Attending: Vascular Surgery | Admitting: Vascular Surgery

## 2017-09-13 VITALS — BP 151/83 | HR 97 | Temp 98.9°F | Resp 17 | Wt 273.3 lb

## 2017-09-13 DIAGNOSIS — I779 Disorder of arteries and arterioles, unspecified: Secondary | ICD-10-CM

## 2017-09-13 DIAGNOSIS — I739 Peripheral vascular disease, unspecified: Secondary | ICD-10-CM | POA: Diagnosis not present

## 2017-09-13 DIAGNOSIS — Z959 Presence of cardiac and vascular implant and graft, unspecified: Secondary | ICD-10-CM

## 2017-09-13 NOTE — Progress Notes (Signed)
[facility] [place]   CC: Follow up peripheral artery occlusive disease  History of Present Illness [name] [name] is a 66 y.o. female who returns for follow-up today for peripheral arterial disease. She underwent angioplasty and drug-eluting balloon in her right superficial femoral artery as well as distal right superficial femoral proximal popliteal artery stenting in July 2017 by Dr. Oneida Alar. She does not currently complain of rest pain or claudication symptoms. However, she is minimally ambulatory.   Unfortunate she continues to smoke and she was counseled against this again today. She is on Plavix and on fenofibrate for her cholesterol.  Review of systems: She develops shortness of breath with minimal exertion. She denies chest pain. She recently had an injection for her back and states that this back issue limits her walking as well.   She has stage 4 CKD.   Pt states no heart problems.   Dr. Oneida Alar last evaluated pt on 03-14-17. At that time ABIs were 0.61 on the left 0.61 on the right, monophasic waveforms bilaterally; duplex ultrasound showed a right lower extremity stent with triphasic waveforms throughout the stent. Patent right superficial femoral artery stent, asymptomatic at that visit,  ABIs of 0.6 probably suggestive of tibial disease below the level of the stent. The patient was to follow-up in 6 months time with repeat duplex and bilateral ABIs. She'll follow-up sooner she has any worsening of her symptoms.  She reports two strokes about 2008, denies any residual neurological deficits.   Pt states her blood pressure was OK in her PCP's office recently.    Diabetic: Yes, states her last A1C was 7.9 Tobacco use: smoker  (6 cigarettes/day, started at age 54 yrs)  Pt meds include: Statin :Yes ASA: No Other anticoagulants/antiplatelets: Plavix  Past Medical History:  Diagnosis Date  . Anemia   . Arthritis   . Asthma   . Bronchitis   .  Chronic kidney disease   . Chronic kidney disease   . Chronic pain syndrome   . Cramping of feet   . Diabetes mellitus   . GERD (gastroesophageal reflux disease)   . Gout   . Headache   . History of hip replacement, total   . History of transfusion   . Hx MRSA infection    abscess left groin  . Hx of cardiac cath 06/2014  . Hyperlipidemia   . Hypertension   . Hypothyroid   . 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 Morbidly obese ([place])   . Peripheral vascular disease ([place])   . Positive cardiac stress test 12/2013   anterior and lateral ischemia on Myoview  . Sleep apnea    UNABLE TO TOLERATE C PAP  . Stress incontinence   . Thyroid disease   . TIA (transient ischemic attack)     X2 NO RESIDUAL PROBLEMS    Social History Social History   Tobacco Use  . Smoking status: Current Every Day Smoker    Packs/day: 0.25    Years: 40.00    Pack years: 10.00    Types: Cigarettes  . Smokeless tobacco: Never Used  Substance Use Topics  . Alcohol use: Yes    Alcohol/week: 0.6 oz    Types: 1 [name] drinks or equivalent per week    Comment: OCC  . Drug use: No    Comment: Quit four years ago.    Family History Family History  Problem Relation Age of Onset  . Diabetes Brother   . Cardiomyopathy Mother   .  Heart disease Mother   . Cancer Father     Past Surgical History:  Procedure Laterality Date  . ABDOMINAL HYSTERECTOMY    . COLON SURGERY  1995   DUE TO POLYP  . FOREARM FRACTURE SURGERY     Left arm  . HERNIA REPAIR     w/ mesh  . INCISION AND DRAINAGE ABSCESS Left 02/04/2013   Procedure: INCISION AND DRAINAGE LEFT BUTTOCK ABSCESS; INCISION AND DRAINAGE LEFT BREAST ABSCESS;  Surgeon: Harl Bowie, MD;  Location: [place];  Service: General;  Laterality: Left;  . INCISION AND DRAINAGE ABSCESS N/A 02/12/2013   Procedure: INCISION AND DEBRIDEMENT BUTTOCK WOUND ;  Surgeon: [name]. Georgette Dover, MD;  Location: [place];  Service: General;  Laterality: N/A;  . INCISION AND  DRAINAGE ABSCESS N/A 02/14/2013   Procedure: INCISION AND DRAINAGE/DRESSING CHANGE;  Surgeon: Harl Bowie, MD;  Location: [place];  Service: General;  Laterality: N/A;  . INCISION AND DRAINAGE ABSCESS N/A 03/21/2015   Procedure: INCISION AND DRAINAGE PUBIC ABSCESS;  Surgeon: Excell Seltzer, MD;  Location: [facility];  Service: General;  Laterality: N/A;  . INCISION AND DRAINAGE PERIRECTAL ABSCESS Left 02/10/2013   Procedure: IRRIGATION AND DEBRIDEMENT OF BUTTOCK/PERINEAL ABSCESS;  Surgeon: [name]. Georgette Dover, MD;  Location: [place];  Service: General;  Laterality: Left;  . INCISION AND DRAINAGE PERIRECTAL ABSCESS N/A 02/16/2013   Procedure: IRRIGATION AND DEBRIDEMENT PERINEAL ABSCESS;  Surgeon: Zenovia Jarred, MD;  Location: [place];  Service: General;  Laterality: N/A;  . IRRIGATION AND DEBRIDEMENT ABSCESS N/A 02/06/2013   Procedure: IRRIGATION AND DEBRIDEMENT BUTTOCK ABSCESS AND DRESSING CHANGE;  Surgeon: Harl Bowie, MD;  Location: [place];  Service: General;  Laterality: N/A;  . IRRIGATION AND DEBRIDEMENT ABSCESS Left 02/08/2013   Procedure: IRRIGATION AND DEBRIDEMENT ABSCESS/DRESSING CHANGE;  Surgeon: Gwenyth Ober, MD;  Location: [place];  Service: General;  Laterality: Left;  . JOINT REPLACEMENT  2010 / 2012   . LAPAROSCOPIC CHOLECYSTECTOMY    . LEFT HEART CATHETERIZATION WITH CORONARY ANGIOGRAM N/A 07/02/2014   Procedure: LEFT HEART CATHETERIZATION WITH CORONARY ANGIOGRAM;  Surgeon: Lorretta Harp, MD;  [facility];  Service: Cardiovascular;  Laterality: N/A;  . LOWER EXTREMITY ANGIOGRAM Right 04/20/2016   Procedure: Lower Extremity Angiogram;  Surgeon: Elam Dutch, MD;  Location: [place] CV [facility];  Service: Cardiovascular;  Laterality: Right;  . PERIPHERAL VASCULAR CATHETERIZATION N/A 04/20/2016   Procedure: Abdominal Aortogram;  Surgeon: Elam Dutch, MD;  Location: [place] CV [facility];  Service: Cardiovascular;  Laterality: N/A;  . PERIPHERAL VASCULAR CATHETERIZATION Right  04/20/2016   Procedure: Peripheral Vascular Intervention;  Surgeon: Elam Dutch, MD;  Location: [place] CV [facility];  Service: Cardiovascular;  Laterality: Right;  popiteal  . THYROIDECTOMY    . TOTAL HIP REVISION Right 08/11/2014   Procedure: RIGHT ACETABULAR REVISION;  Surgeon: Gearlean Alf, MD;  Location: [facility];  Service: Orthopedics;  Laterality: Right;    Allergies  Allergen Reactions  . Nebivolol Swelling  . Ace Inhibitors Swelling and Other (See Comments)    Tongue swell  . Bystolic [Nebivolol Hcl] Swelling and Other (See Comments)    Chest pain   . Morphine And Related Itching    Current Outpatient Medications  Medication Sig Dispense Refill  . albuterol (PROVENTIL) (2.5 MG/3ML) 0.083% nebulizer solution Take 2.5 mg by nebulization every 6 (six) hours as needed for wheezing or shortness of breath.    . allopurinol (ZYLOPRIM) 100 MG tablet Take  100 mg by mouth every morning.     . B Complex Vitamins (VITAMIN B COMPLEX PO) Take 1 tablet by mouth 2 (two) times daily.     . budesonide-formoterol (SYMBICORT) 160-4.5 MCG/ACT inhaler Inhale 2 puffs into the lungs 2 (two) times daily as needed (for SOB).     . cetirizine (ZYRTEC) 10 MG tablet Take 10 mg by mouth daily as needed for allergies.     Marland Kitchen clopidogrel (PLAVIX) 75 MG tablet TAKE 1 TABLET BY MOUTH EVERY DAY 30 tablet 0  . Dulaglutide 1.5 MG/0.5ML SOPN Inject into the skin.    . fenofibrate (TRICOR) 145 MG tablet TAKE 1 TABLET (145 MG TOTAL) BY MOUTH DAILY. 90 tablet 2  . fluticasone (FLONASE) 50 MCG/ACT nasal spray Place 2 sprays into the nose as needed for allergies.     . furosemide (LASIX) 80 MG tablet Take 160 mg by mouth 2 (two) times daily.    . insulin lispro protamine-lispro (HUMALOG 75/25 MIX) (75-25) 100 UNIT/ML SUSP injection Inject 70 Units into the skin 2 (two) times daily with a meal.     . levothyroxine (SYNTHROID, LEVOTHROID) 150 MCG tablet Take 150 mcg by mouth daily before breakfast.    . mirabegron ER  (MYRBETRIQ) 50 MG TB24 tablet Take 50 mg by mouth daily.    . Multiple Vitamin (MULTIVITAMIN WITH MINERALS) TABS tablet Take 1 tablet by mouth daily.    . naftifine (NAFTIN) 1 % cream APPLY TO THE AFFECTED AREA ON SKIN EVERY DAY (Patient taking differently: APPLY TO THE AFFECTED AREA ON SKIN AS NEEDED FOR IRRITATION) 60 g 0  . oxyCODONE-acetaminophen (PERCOCET) 10-325 MG tablet Take 1 tablet by mouth every 4 (four) hours as needed for pain.    . polyvinyl alcohol (LIQUIFILM TEARS) 1.4 % ophthalmic solution Place 2 drops into both eyes daily as needed for dry eyes.    . potassium chloride SA (K-DUR,KLOR-CON) 20 MEQ tablet Take 40 mEq by mouth 2 (two) times daily.     . simvastatin (ZOCOR) 20 MG tablet Take 20 mg by mouth at bedtime.  1  . tiZANidine (ZANAFLEX) 4 MG capsule Take 4 mg by mouth 2 (two) times daily.     . metolazone (ZAROXOLYN) 2.5 MG tablet TK 1 T PO BID B TAKING LASIX  2   No current facility-administered medications for this visit.     ROS: See HPI for pertinent positives and negatives.   Physical Examination  Vitals:   09/13/17 1426 09/13/17 1429  BP: (!) 163/85 (!) 151/83  Pulse: 97   Resp: 17   Temp: 98.9 F (37.2 C)   TempSrc: Oral   SpO2: 93%   Weight: 273 lb 4.8 oz (124 kg)    Body mass index is 46.91 kg/m.  General: A&O x 3, WDWN, morbidly obese female. Gait: seated in w/c, from exam table to w/c needs assistance.  Eyes: PERRLA. Pulmonary: Respirations are non labored, CTAB, distant breath sounds Cardiac: regular Rrythm, no detected murmur.         Carotid Bruits Right Left   Negative Negative   Radial pulses are 2+ palpable bilaterally   Adominal aortic pulse is not palpable                         VASCULAR EXAM: Extremities without ischemic changes, without Gangrene; without open wounds.  LE Pulses Right Left       FEMORAL  not palpable  1+  palpable        POPLITEAL  not palpable   not palpable       POSTERIOR TIBIAL  not palpable   not palpable        DORSALIS PEDIS      ANTERIOR TIBIAL not palpable  not palpable    Abdomen: soft, NT, no palpable masses, large panuus. Skin: no rashes, no ulcers noted. Musculoskeletal: no muscle wasting or atrophy.  Neurologic: A&O X 3; Appropriate Affect ; SENSATION: normal; MOTOR FUNCTION:  moving all extremities equally, motor strength 5/5 throughout. Speech is fluent/normal. CN 2-12 intact.    ASSESSMENT: [name] [name] is a 66 y.o. female who is s/p angioplasty and drug-eluting balloon in her right superficial femoral artery as well as distal right superficial femoral proximal popliteal artery stenting in July 2017.  There are no signs of ischemia in her feet or legs.   She does not walk much, not enough to elicit claudication symptoms , seems mostly due to lumbar spine issues with right sciatic pain, sees Dr. Nelva Bush for this, receives injections in her spine to help pain.   DATA  Right LE Arterial Duplex (09/13/17): Patent right distal SFA stent with no restenosis, bi and triphasic waveforms. No significant change compared to the exam on 03-14-17.   ABI (Date: 09/13/2017):  R:   ABI: 0.68 (was 0.61 on 03-14-17),   PT: bi  DP: bi  TBI:  0.53 (was 0.59)  L:   ABI: 0.47 (was 0.61),   PT: bi  DP: bi  TBI: 0.43 (was 0.42) Slightly improved on the right with moderate disease, decline in the left ABI with severe disease; all biphasic waveforms. Bilateral TBI are stable.     PLAN:  Based on the patient's vascular studies and examination, pt will return to clinic in 6 months with ABI's and right LE arterial duplex.  I advised her to notify us if she develops concerns re the circulation in her feet or legs.  Daily seated leg exercises discussed and demonstrated.   I discussed in depth with the patient the nature of atherosclerosis, and emphasized the importance  of maximal medical management including strict control of blood pressure, blood glucose, and lipid levels, obtaining regular exercise, and continued cessation of smoking.  The patient is aware that without maximal medical management the underlying atherosclerotic disease process will progress, limiting the benefit of any interventions.  The patient was given information about PAD including signs, symptoms, treatment, what symptoms should prompt the patient to seek immediate medical care, and risk reduction measures to take.  Clemon Chambers, RN, MSN, FNP-C [facility] Phone: [phone]  Clinic MD: [place]  09/13/17 2:38 PM

## 2017-09-13 NOTE — Patient Instructions (Signed)
Steps to Quit Smoking Smoking tobacco can be bad for your health. It can also affect almost every organ in your body. Smoking puts you and people around you at risk for many serious long-lasting (chronic) diseases. Quitting smoking is hard, but it is one of the best things that you can do for your health. It is never too late to quit. What are the benefits of quitting smoking? When you quit smoking, you lower your risk for getting serious diseases and conditions. They can include:  Lung cancer or lung disease.  Heart disease.  Stroke.  Heart attack.  Not being able to have children (infertility).  Weak bones (osteoporosis) and broken bones (fractures).  If you have coughing, wheezing, and shortness of breath, those symptoms may get better when you quit. You may also get sick less often. If you are pregnant, quitting smoking can help to lower your chances of having a baby of low birth weight. What can I do to help me quit smoking? Talk with your doctor about what can help you quit smoking. Some things you can do (strategies) include:  Quitting smoking totally, instead of slowly cutting back how much you smoke over a period of time.  Going to in-person counseling. You are more likely to quit if you go to many counseling sessions.  Using resources and support systems, such as: ? Online chats with a counselor. ? Phone quitlines. ? Printed self-help materials. ? Support groups or group counseling. ? Text messaging programs. ? Mobile phone apps or applications.  Taking medicines. Some of these medicines may have nicotine in them. If you are pregnant or breastfeeding, do not take any medicines to quit smoking unless your doctor says it is okay. Talk with your doctor about counseling or other things that can help you.  Talk with your doctor about using more than one strategy at the same time, such as taking medicines while you are also going to in-person counseling. This can help make  quitting easier. What things can I do to make it easier to quit? Quitting smoking might feel very hard at first, but there is a lot that you can do to make it easier. Take these steps:  Talk to your family and friends. Ask them to support and encourage you.  Call phone quitlines, reach out to support groups, or work with a counselor.  Ask people who smoke to not smoke around you.  Avoid places that make you want (trigger) to smoke, such as: ? Bars. ? Parties. ? Smoke-break areas at work.  Spend time with people who do not smoke.  Lower the stress in your life. Stress can make you want to smoke. Try these things to help your stress: ? Getting regular exercise. ? Deep-breathing exercises. ? Yoga. ? Meditating. ? Doing a body scan. To do this, close your eyes, focus on one area of your body at a time from head to toe, and notice which parts of your body are tense. Try to relax the muscles in those areas.  Download or buy apps on your mobile phone or tablet that can help you stick to your quit plan. There are many free apps, such as QuitGuide from the CDC (Centers for Disease Control and Prevention). You can find more support from smokefree.gov and other websites.  This information is not intended to replace advice given to you by your health care provider. Make sure you discuss any questions you have with your health care provider. Document Released: 07/21/2009 Document   Revised: 05/22/2016 Document Reviewed: 02/08/2015 Elsevier Interactive Patient Education  2018 Elsevier Inc.     Peripheral Vascular Disease Peripheral vascular disease (PVD) is a disease of the blood vessels that are not part of your heart and brain. A simple term for PVD is poor circulation. In most cases, PVD narrows the blood vessels that carry blood from your heart to the rest of your body. This can result in a decreased supply of blood to your arms, legs, and internal organs, like your stomach or kidneys.  However, it most often affects a person's lower legs and feet. There are two types of PVD.  Organic PVD. This is the more common type. It is caused by damage to the structure of blood vessels.  Functional PVD. This is caused by conditions that make blood vessels contract and tighten (spasm).  Without treatment, PVD tends to get worse over time. PVD can also lead to acute ischemic limb. This is when an arm or limb suddenly has trouble getting enough blood. This is a medical emergency. Follow these instructions at home:  Take medicines only as told by your doctor.  Do not use any tobacco products, including cigarettes, chewing tobacco, or electronic cigarettes. If you need help quitting, ask your doctor.  Lose weight if you are overweight, and maintain a healthy weight as told by your doctor.  Eat a diet that is low in fat and cholesterol. If you need help, ask your doctor.  Exercise regularly. Ask your doctor for some good activities for you.  Take good care of your feet. ? Wear comfortable shoes that fit well. ? Check your feet often for any cuts or sores. Contact a doctor if:  You have cramps in your legs while walking.  You have leg pain when you are at rest.  You have coldness in a leg or foot.  Your skin changes.  You are unable to get or have an erection (erectile dysfunction).  You have cuts or sores on your feet that are not healing. Get help right away if:  Your arm or leg turns cold and blue.  Your arms or legs become red, warm, swollen, painful, or numb.  You have chest pain or trouble breathing.  You suddenly have weakness in your face, arm, or leg.  You become very confused or you cannot speak.  You suddenly have a very bad headache.  You suddenly cannot see. This information is not intended to replace advice given to you by your health care provider. Make sure you discuss any questions you have with your health care provider. Document Released:  12/19/2009 Document Revised: 03/01/2016 Document Reviewed: 03/04/2014 Elsevier Interactive Patient Education  2017 Elsevier Inc.  

## 2017-10-11 ENCOUNTER — Other Ambulatory Visit: Payer: Self-pay | Admitting: Internal Medicine

## 2017-10-11 DIAGNOSIS — N63 Unspecified lump in unspecified breast: Secondary | ICD-10-CM

## 2017-10-14 ENCOUNTER — Other Ambulatory Visit: Payer: Medicare Other

## 2017-10-23 ENCOUNTER — Ambulatory Visit
Admission: RE | Admit: 2017-10-23 | Discharge: 2017-10-23 | Disposition: A | Discharge status: Home / Self Care | Payer: Medicare Other | Source: Ambulatory Visit | Attending: Internal Medicine | Admitting: Internal Medicine

## 2017-10-23 ENCOUNTER — Other Ambulatory Visit: Payer: Self-pay | Admitting: Internal Medicine

## 2017-10-23 DIAGNOSIS — N63 Unspecified lump in unspecified breast: Secondary | ICD-10-CM

## 2017-12-25 ENCOUNTER — Other Ambulatory Visit: Payer: Self-pay | Admitting: Internal Medicine

## 2017-12-25 ENCOUNTER — Ambulatory Visit
Admission: RE | Admit: 2017-12-25 | Discharge: 2017-12-25 | Disposition: A | Discharge status: Home / Self Care | Payer: Medicare Other | Source: Ambulatory Visit | Attending: Internal Medicine | Admitting: Internal Medicine

## 2017-12-25 DIAGNOSIS — N63 Unspecified lump in unspecified breast: Secondary | ICD-10-CM

## 2017-12-27 ENCOUNTER — Other Ambulatory Visit: Payer: Medicare Other

## 2018-01-01 ENCOUNTER — Ambulatory Visit
Admission: RE | Admit: 2018-01-01 | Discharge: 2018-01-01 | Disposition: A | Discharge status: Home / Self Care | Payer: Medicare Other | Source: Ambulatory Visit | Attending: Internal Medicine | Admitting: Internal Medicine

## 2018-01-01 DIAGNOSIS — N63 Unspecified lump in unspecified breast: Secondary | ICD-10-CM

## 2018-03-07 ENCOUNTER — Other Ambulatory Visit: Payer: Self-pay

## 2018-03-07 DIAGNOSIS — I779 Disorder of arteries and arterioles, unspecified: Secondary | ICD-10-CM

## 2018-03-07 DIAGNOSIS — I739 Peripheral vascular disease, unspecified: Secondary | ICD-10-CM

## 2018-03-20 ENCOUNTER — Encounter: Payer: Self-pay | Admitting: Family

## 2018-03-20 ENCOUNTER — Ambulatory Visit (INDEPENDENT_AMBULATORY_CARE_PROVIDER_SITE_OTHER): Payer: Medicare Other | Admitting: Family

## 2018-03-20 ENCOUNTER — Ambulatory Visit (INDEPENDENT_AMBULATORY_CARE_PROVIDER_SITE_OTHER)
Admission: RE | Admit: 2018-03-20 | Discharge: 2018-03-20 | Disposition: A | Discharge status: Home / Self Care | Payer: Medicare Other | Source: Ambulatory Visit | Attending: Vascular Surgery | Admitting: Vascular Surgery

## 2018-03-20 ENCOUNTER — Ambulatory Visit (HOSPITAL_COMMUNITY)
Admission: RE | Admit: 2018-03-20 | Discharge: 2018-03-20 | Disposition: A | Discharge status: Home / Self Care | Payer: Medicare Other | Source: Ambulatory Visit | Attending: Vascular Surgery | Admitting: Vascular Surgery

## 2018-03-20 VITALS — BP 172/86 | HR 92 | Temp 98.3°F | Resp 16 | Wt 262.0 lb

## 2018-03-20 DIAGNOSIS — F17200 Nicotine dependence, unspecified, uncomplicated: Secondary | ICD-10-CM

## 2018-03-20 DIAGNOSIS — Z959 Presence of cardiac and vascular implant and graft, unspecified: Secondary | ICD-10-CM

## 2018-03-20 DIAGNOSIS — E785 Hyperlipidemia, unspecified: Secondary | ICD-10-CM | POA: Insufficient documentation

## 2018-03-20 DIAGNOSIS — I739 Peripheral vascular disease, unspecified: Secondary | ICD-10-CM

## 2018-03-20 DIAGNOSIS — Z95828 Presence of other vascular implants and grafts: Secondary | ICD-10-CM | POA: Insufficient documentation

## 2018-03-20 DIAGNOSIS — I779 Disorder of arteries and arterioles, unspecified: Secondary | ICD-10-CM

## 2018-03-20 DIAGNOSIS — I10 Essential (primary) hypertension: Secondary | ICD-10-CM | POA: Insufficient documentation

## 2018-03-20 DIAGNOSIS — E119 Type 2 diabetes mellitus without complications: Secondary | ICD-10-CM | POA: Diagnosis not present

## 2018-03-20 NOTE — Progress Notes (Signed)
[facility] [place]   CC: Follow up peripheral artery occlusive disease  History of Present Illness [name] [name] is a 67 y.o. female who returns for follow-up today for peripheral arterial disease. She underwent angioplasty and drug-eluting balloon in her right superficial femoral artery as well as distal right superficial femoral proximal popliteal artery stenting in July 2017 by Dr. Oneida Alar. She does not currently complain of rest pain or claudication symptoms. However, she is minimally ambulatory.   Review of systems: She develops shortness of breath with minimal exertion. She denies chest pain. She receives injections for her back and states that this back issue limits her walking as well.   She has stage 4 CKD.   Pt states no heart problems.   Dr. Oneida Alar last evaluated pt on 03-14-17. At that time ABIs were 0.61 on the left 0.61 on the right, monophasic waveforms bilaterally; duplex ultrasound showed a right lower extremity stent with triphasic waveforms throughout the stent. Patent right superficial femoral artery stent, asymptomatic at that visit,  ABIs of 0.6 probably suggestive of tibial disease below the level of the stent. The patient was to follow-up in 6 months time with repeat duplex and bilateral ABIs. She'll follow-up sooner she has any worsening of her symptoms.  She reports two strokes about 2008, denies any residual neurological deficits.   Pt states her blood pressure was OK in her PCP's office recently.    Diabetic: Yes, states her last A1C was 7.1 Tobacco use: smoker  (4-5 cigarettes/day, started at age 69 yrs)  Pt meds include: Statin :Yes ASA: No Other anticoagulants/antiplatelets: Plavix    Past Medical History:  Diagnosis Date  . Anemia   . Arthritis   . Asthma   . Bronchitis   . Chronic kidney disease   . Chronic kidney disease   . Chronic pain syndrome   . Cramping of feet   . Diabetes mellitus   . GERD  (gastroesophageal reflux disease)   . Gout   . Headache   . History of hip replacement, total   . History of transfusion   . Hx MRSA infection    abscess left groin  . Hx of cardiac cath 06/2014  . Hyperlipidemia   . Hypertension   . Hypothyroid   . 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 Morbidly obese ([place])   . Peripheral vascular disease ([place])   . Positive cardiac stress test 12/2013   anterior and lateral ischemia on Myoview  . Sleep apnea    UNABLE TO TOLERATE C PAP  . Stress incontinence   . Thyroid disease   . TIA (transient ischemic attack)     X2 NO RESIDUAL PROBLEMS    Social History Social History   Tobacco Use  . Smoking status: Current Every Day Smoker    Packs/day: 0.25    Years: 40.00    Pack years: 10.00    Types: Cigarettes  . Smokeless tobacco: Never Used  Substance Use Topics  . Alcohol use: Yes    Alcohol/week: 0.6 oz    Types: 1 Standard drinks or equivalent per week    Comment: OCC  . Drug use: No    Comment: Quit four years ago.    Family History Family History  Problem Relation Age of Onset  . Diabetes Brother   . Cardiomyopathy Mother   . Heart disease Mother   . Cancer Father     Past Surgical History:  Procedure Laterality Date  .  ABDOMINAL HYSTERECTOMY    . COLON SURGERY  1995   DUE TO POLYP  . FOREARM FRACTURE SURGERY     Left arm  . HERNIA REPAIR     w/ mesh  . INCISION AND DRAINAGE ABSCESS Left 02/04/2013   Procedure: INCISION AND DRAINAGE LEFT BUTTOCK ABSCESS; INCISION AND DRAINAGE LEFT BREAST ABSCESS;  Surgeon: Harl Bowie, MD;  Location: [facility];  Service: General;  Laterality: Left;  . INCISION AND DRAINAGE ABSCESS N/A 02/12/2013   Procedure: INCISION AND DEBRIDEMENT BUTTOCK WOUND ;  Surgeon: [name]. Georgette Dover, MD;  Location: [facility];  Service: General;  Laterality: N/A;  . INCISION AND DRAINAGE ABSCESS N/A 02/14/2013   Procedure: INCISION AND DRAINAGE/DRESSING CHANGE;  Surgeon: Harl Bowie, MD;  Location: [facility];   Service: General;  Laterality: N/A;  . INCISION AND DRAINAGE ABSCESS N/A 03/21/2015   Procedure: INCISION AND DRAINAGE PUBIC ABSCESS;  Surgeon: Excell Seltzer, MD;  Location: [facility];  Service: General;  Laterality: N/A;  . INCISION AND DRAINAGE PERIRECTAL ABSCESS Left 02/10/2013   Procedure: IRRIGATION AND DEBRIDEMENT OF BUTTOCK/PERINEAL ABSCESS;  Surgeon: [name]. Georgette Dover, MD;  Location: [facility];  Service: General;  Laterality: Left;  . INCISION AND DRAINAGE PERIRECTAL ABSCESS N/A 02/16/2013   Procedure: IRRIGATION AND DEBRIDEMENT PERINEAL ABSCESS;  Surgeon: Zenovia Jarred, MD;  Location: [facility];  Service: General;  Laterality: N/A;  . IRRIGATION AND DEBRIDEMENT ABSCESS N/A 02/06/2013   Procedure: IRRIGATION AND DEBRIDEMENT BUTTOCK ABSCESS AND DRESSING CHANGE;  Surgeon: Harl Bowie, MD;  Location: [facility];  Service: General;  Laterality: N/A;  . IRRIGATION AND DEBRIDEMENT ABSCESS Left 02/08/2013   Procedure: IRRIGATION AND DEBRIDEMENT ABSCESS/DRESSING CHANGE;  Surgeon: Gwenyth Ober, MD;  Location: [facility];  Service: General;  Laterality: Left;  . JOINT REPLACEMENT  2010 / 2012   . LAPAROSCOPIC CHOLECYSTECTOMY    . LEFT HEART CATHETERIZATION WITH CORONARY ANGIOGRAM N/A 07/02/2014   Procedure: LEFT HEART CATHETERIZATION WITH CORONARY ANGIOGRAM;  Surgeon: Lorretta Harp, MD;  Location: [facility];  Service: Cardiovascular;  Laterality: N/A;  . LOWER EXTREMITY ANGIOGRAM Right 04/20/2016   Procedure: Lower Extremity Angiogram;  Surgeon: Elam Dutch, MD;  Location: [facility];  Service: Cardiovascular;  Laterality: Right;  . PERIPHERAL VASCULAR CATHETERIZATION N/A 04/20/2016   Procedure: Abdominal Aortogram;  Surgeon: Elam Dutch, MD;  Location: [place] [facility];  Service: Cardiovascular;  Laterality: N/A;  . PERIPHERAL VASCULAR CATHETERIZATION Right 04/20/2016   Procedure: Peripheral Vascular Intervention;  Surgeon: Elam Dutch, MD;  Location: [facility];  Service:  Cardiovascular;  Laterality: Right;  popiteal  . THYROIDECTOMY    . TOTAL HIP REVISION Right 08/11/2014   Procedure: RIGHT ACETABULAR REVISION;  Surgeon: Gearlean Alf, MD;  Location: [facility];  Service: Orthopedics;  Laterality: Right;    Allergies  Allergen Reactions  . Nebivolol Swelling  . Ace Inhibitors Swelling and Other (See Comments)    Tongue swell  . Bystolic [Nebivolol Hcl] Swelling and Other (See Comments)    Chest pain   . Morphine And Related Itching    Current Outpatient Medications  Medication Sig Dispense Refill  . albuterol (PROVENTIL) (2.5 MG/3ML) 0.083% nebulizer solution Take 2.5 mg by nebulization every 6 (six) hours as needed for wheezing or shortness of breath.    . allopurinol (ZYLOPRIM) 100 MG tablet Take 100 mg by mouth every morning.     . B Complex Vitamins (VITAMIN B COMPLEX PO) Take 1 tablet  by mouth 2 (two) times daily.     . budesonide-formoterol (SYMBICORT) 160-4.5 MCG/ACT inhaler Inhale 2 puffs into the lungs 2 (two) times daily as needed (for SOB).     . Calcium Carbonate-Vit D-Min (CALCIUM 1200 PO) Take by mouth.    . cetirizine (ZYRTEC) 10 MG tablet Take 10 mg by mouth daily as needed for allergies.     Marland Kitchen clopidogrel (PLAVIX) 75 MG tablet TAKE 1 TABLET BY MOUTH EVERY DAY 30 tablet 0  . Dulaglutide 1.5 MG/0.5ML SOPN Inject into the skin.    . fenofibrate (TRICOR) 145 MG tablet TAKE 1 TABLET (145 MG TOTAL) BY MOUTH DAILY. 90 tablet 2  . fluticasone (FLONASE) 50 MCG/ACT nasal spray Place 2 sprays into the nose as needed for allergies.     . furosemide (LASIX) 80 MG tablet Take 160 mg by mouth 2 (two) times daily.    . insulin lispro protamine-lispro (HUMALOG 75/25 MIX) (75-25) 100 UNIT/ML SUSP injection Inject 70 Units into the skin 2 (two) times daily with a meal.     . levothyroxine (SYNTHROID, LEVOTHROID) 150 MCG tablet Take 150 mcg by mouth daily before breakfast.    . MAGNESIUM PO Take 600 mg by mouth daily.    . metolazone (ZAROXOLYN) 2.5 MG  tablet TK 1 T PO BID B TAKING LASIX  2  . mirabegron ER (MYRBETRIQ) 50 MG TB24 tablet Take 50 mg by mouth daily.    . Multiple Vitamin (MULTIVITAMIN WITH MINERALS) TABS tablet Take 1 tablet by mouth daily.    . naftifine (NAFTIN) 1 % cream APPLY TO THE AFFECTED AREA ON SKIN EVERY DAY (Patient taking differently: APPLY TO THE AFFECTED AREA ON SKIN AS NEEDED FOR IRRITATION) 60 g 0  . oxyCODONE-acetaminophen (PERCOCET) 10-325 MG tablet Take 1 tablet by mouth every 4 (four) hours as needed for pain.    . polyvinyl alcohol (LIQUIFILM TEARS) 1.4 % ophthalmic solution Place 2 drops into both eyes daily as needed for dry eyes.    . potassium chloride SA (K-DUR,KLOR-CON) 20 MEQ tablet Take 40 mEq by mouth 2 (two) times daily.     . simvastatin (ZOCOR) 20 MG tablet Take 20 mg by mouth at bedtime.  1  . tiZANidine (ZANAFLEX) 4 MG capsule Take 4 mg by mouth 2 (two) times daily.      No current facility-administered medications for this visit.     ROS: See HPI for pertinent positives and negatives.   Physical Examination  Vitals:   03/20/18 1311 03/20/18 1313  BP: (!) 157/87 (!) 172/86  Pulse: 95 92  Resp: 16   Temp: 98.3 F (36.8 C)   TempSrc: Oral   SpO2: 95% 95%  Weight: 262 lb (118.8 kg)    Body mass index is 44.97 kg/m.  General: A&O x 3, WDWN, morbidly obese female. Gait: slow, steady, using cane Eyes: PERRLA. Pulmonary: Respirations are non labored, CTAB, distant breath sounds Cardiac: regular Rrythm, no detected murmur.         Carotid Bruits Right Left   Negative Negative   Radial pulses are 2+ palpable bilaterally   Adominal aortic pulse is not palpable                         VASCULAR EXAM: Extremities without ischemic changes, without Gangrene; without open wounds. Right foot larger than left which seems to be pitting and non pitting edema and adipose tissue.  LE Pulses Right Left       FEMORAL  faintly palpable  1+ palpable        POPLITEAL  not palpable   not palpable       POSTERIOR TIBIAL  not palpable   not palpable        DORSALIS PEDIS      ANTERIOR TIBIAL not palpable  not palpable    Abdomen: soft, NT, no palpable masses, large panuus. Musculoskeletal: no muscle wasting or atrophy.      Skin: no rashes, no cellulitis, no ulcers noted. Neurologic: A&O X 3; appropriate affect, Sensation is normal; MOTOR FUNCTION:  moving all extremities equally, motor strength 5/5 throughout. Speech is fluent/normal. CN 2-12 intact. Psychiatric: Thought content is normal, mood appropriate for clinical situation.     ASSESSMENT: HERMENIA FRITCHER is a 67 y.o. female who is s/p angioplasty and drug-eluting balloon in her right superficial femoral artery as well as distal right superficial femoral proximal popliteal artery stenting in July 2017.  There are no signs of ischemia in her feet or legs.   She does not walk much, not enough to elicit claudication symptoms , seems mostly due to lumbar spine issues with right sciatic pain, sees Dr. Nelva Bush for this, receives injections in her spine to help pain.   She has been walking more since her last visit, and left ABI has improved, pt is pleased with her weight loss.    DATA  Right LE Arterial Duplex (03-20-18): Patent right distal SFA stent with no restenosis, biphasic waveforms. No significant change compared to the exams on 03-14-17 and 09-13-17.    ABI (Date: 03/20/2018):  R:   ABI: 0.70 (was 0.68 on 09-13-17),   PT: bi  DP: bi  TBI:  0.55 (was 0.53)  L:   ABI: 0.56 (was 0.49),   PT: bi  DP: bi  TBI: 0.47 (was 0.43)  Stable right ABI and TBI with moderate disease; improved left ABI with moderate disease, left TBI stable. All biphasic waveforms.      PLAN:  Based on the patient's vascular studies and examination, pt will  return to clinic in 1 year with ABI's and right LE arterial duplex.  I advised her to notify us if she develops concerns re the circulation in her feet or legs.  Daily seated leg exercises discussed and demonstrated.    Over 3 minutes was spent counseling patient re smoking cessation, and patient was given several free resources re smoking cessation.  I discussed in depth with the patient the nature of atherosclerosis, and emphasized the importance of maximal medical management including strict control of blood pressure, blood glucose, and lipid levels, obtaining regular exercise, and cessation of smoking.  The patient is aware that without maximal medical management the underlying atherosclerotic disease process will progress, limiting the benefit of any interventions.  The patient was given information about PAD including signs, symptoms, treatment, what symptoms should prompt the patient to seek immediate medical care, and risk reduction measures to take.  Clemon Chambers, RN, MSN, FNP-C [facility] Phone: [phone]  Clinic MD: Oneida Alar  03/20/18 1:15 PM

## 2018-03-20 NOTE — Patient Instructions (Signed)
Steps to Quit Smoking Smoking tobacco can be bad for your health. It can also affect almost every organ in your body. Smoking puts you and people around you at risk for many serious long-lasting (chronic) diseases. Quitting smoking is hard, but it is one of the best things that you can do for your health. It is never too late to quit. What are the benefits of quitting smoking? When you quit smoking, you lower your risk for getting serious diseases and conditions. They can include:  Lung cancer or lung disease.  Heart disease.  Stroke.  Heart attack.  Not being able to have children (infertility).  Weak bones (osteoporosis) and broken bones (fractures).  If you have coughing, wheezing, and shortness of breath, those symptoms may get better when you quit. You may also get sick less often. If you are pregnant, quitting smoking can help to lower your chances of having a baby of low birth weight. What can I do to help me quit smoking? Talk with your doctor about what can help you quit smoking. Some things you can do (strategies) include:  Quitting smoking totally, instead of slowly cutting back how much you smoke over a period of time.  Going to in-person counseling. You are more likely to quit if you go to many counseling sessions.  Using resources and support systems, such as: ? Online chats with a counselor. ? Phone quitlines. ? Printed self-help materials. ? Support groups or group counseling. ? Text messaging programs. ? Mobile phone apps or applications.  Taking medicines. Some of these medicines may have nicotine in them. If you are pregnant or breastfeeding, do not take any medicines to quit smoking unless your doctor says it is okay. Talk with your doctor about counseling or other things that can help you.  Talk with your doctor about using more than one strategy at the same time, such as taking medicines while you are also going to in-person counseling. This can help make  quitting easier. What things can I do to make it easier to quit? Quitting smoking might feel very hard at first, but there is a lot that you can do to make it easier. Take these steps:  Talk to your family and friends. Ask them to support and encourage you.  Call phone quitlines, reach out to support groups, or work with a counselor.  Ask people who smoke to not smoke around you.  Avoid places that make you want (trigger) to smoke, such as: ? Bars. ? Parties. ? Smoke-break areas at work.  Spend time with people who do not smoke.  Lower the stress in your life. Stress can make you want to smoke. Try these things to help your stress: ? Getting regular exercise. ? Deep-breathing exercises. ? Yoga. ? Meditating. ? Doing a body scan. To do this, close your eyes, focus on one area of your body at a time from head to toe, and notice which parts of your body are tense. Try to relax the muscles in those areas.  Download or buy apps on your mobile phone or tablet that can help you stick to your quit plan. There are many free apps, such as QuitGuide from the CDC (Centers for Disease Control and Prevention). You can find more support from smokefree.gov and other websites.  This information is not intended to replace advice given to you by your health care provider. Make sure you discuss any questions you have with your health care provider. Document Released: 07/21/2009 Document   Revised: 05/22/2016 Document Reviewed: 02/08/2015 Elsevier Interactive Patient Education  2018 Elsevier Inc.     Peripheral Vascular Disease Peripheral vascular disease (PVD) is a disease of the blood vessels that are not part of your heart and brain. A simple term for PVD is poor circulation. In most cases, PVD narrows the blood vessels that carry blood from your heart to the rest of your body. This can result in a decreased supply of blood to your arms, legs, and internal organs, like your stomach or kidneys.  However, it most often affects a person's lower legs and feet. There are two types of PVD.  Organic PVD. This is the more common type. It is caused by damage to the structure of blood vessels.  Functional PVD. This is caused by conditions that make blood vessels contract and tighten (spasm).  Without treatment, PVD tends to get worse over time. PVD can also lead to acute ischemic limb. This is when an arm or limb suddenly has trouble getting enough blood. This is a medical emergency. Follow these instructions at home:  Take medicines only as told by your doctor.  Do not use any tobacco products, including cigarettes, chewing tobacco, or electronic cigarettes. If you need help quitting, ask your doctor.  Lose weight if you are overweight, and maintain a healthy weight as told by your doctor.  Eat a diet that is low in fat and cholesterol. If you need help, ask your doctor.  Exercise regularly. Ask your doctor for some good activities for you.  Take good care of your feet. ? Wear comfortable shoes that fit well. ? Check your feet often for any cuts or sores. Contact a doctor if:  You have cramps in your legs while walking.  You have leg pain when you are at rest.  You have coldness in a leg or foot.  Your skin changes.  You are unable to get or have an erection (erectile dysfunction).  You have cuts or sores on your feet that are not healing. Get help right away if:  Your arm or leg turns cold and blue.  Your arms or legs become red, warm, swollen, painful, or numb.  You have chest pain or trouble breathing.  You suddenly have weakness in your face, arm, or leg.  You become very confused or you cannot speak.  You suddenly have a very bad headache.  You suddenly cannot see. This information is not intended to replace advice given to you by your health care provider. Make sure you discuss any questions you have with your health care provider. Document Released:  12/19/2009 Document Revised: 03/01/2016 Document Reviewed: 03/04/2014 Elsevier Interactive Patient Education  2017 Elsevier Inc.  

## 2018-10-08 HISTORY — PX: BACK SURGERY: SHX140

## 2018-10-20 ENCOUNTER — Other Ambulatory Visit: Payer: Self-pay

## 2018-10-20 ENCOUNTER — Emergency Department (HOSPITAL_COMMUNITY): Payer: Medicare Other

## 2018-10-20 ENCOUNTER — Inpatient Hospital Stay (HOSPITAL_COMMUNITY)
Admission: EM | Admit: 2018-10-20 | Discharge: 2018-10-31 | DRG: 286 | Disposition: A | Discharge status: Home / Self Care | Payer: Medicare Other | Attending: Family Medicine | Admitting: Family Medicine

## 2018-10-20 ENCOUNTER — Inpatient Hospital Stay (HOSPITAL_COMMUNITY): Payer: Medicare Other

## 2018-10-20 ENCOUNTER — Encounter (HOSPITAL_COMMUNITY): Payer: Self-pay | Admitting: Emergency Medicine

## 2018-10-20 DIAGNOSIS — Z79891 Long term (current) use of opiate analgesic: Secondary | ICD-10-CM

## 2018-10-20 DIAGNOSIS — Z885 Allergy status to narcotic agent status: Secondary | ICD-10-CM

## 2018-10-20 DIAGNOSIS — D509 Iron deficiency anemia, unspecified: Secondary | ICD-10-CM | POA: Diagnosis present

## 2018-10-20 DIAGNOSIS — I70229 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, unspecified extremity: Secondary | ICD-10-CM | POA: Diagnosis present

## 2018-10-20 DIAGNOSIS — I130 Hypertensive heart and chronic kidney disease with heart failure and stage 1 through stage 4 chronic kidney disease, or unspecified chronic kidney disease: Principal | ICD-10-CM | POA: Diagnosis present

## 2018-10-20 DIAGNOSIS — I959 Hypotension, unspecified: Secondary | ICD-10-CM | POA: Diagnosis not present

## 2018-10-20 DIAGNOSIS — E6609 Other obesity due to excess calories: Secondary | ICD-10-CM | POA: Diagnosis present

## 2018-10-20 DIAGNOSIS — J9811 Atelectasis: Secondary | ICD-10-CM | POA: Diagnosis present

## 2018-10-20 DIAGNOSIS — I5033 Acute on chronic diastolic (congestive) heart failure: Secondary | ICD-10-CM

## 2018-10-20 DIAGNOSIS — Z90710 Acquired absence of both cervix and uterus: Secondary | ICD-10-CM | POA: Diagnosis not present

## 2018-10-20 DIAGNOSIS — T502X5A Adverse effect of carbonic-anhydrase inhibitors, benzothiadiazides and other diuretics, initial encounter: Secondary | ICD-10-CM | POA: Diagnosis not present

## 2018-10-20 DIAGNOSIS — Z794 Long term (current) use of insulin: Secondary | ICD-10-CM

## 2018-10-20 DIAGNOSIS — I472 Ventricular tachycardia: Secondary | ICD-10-CM | POA: Diagnosis not present

## 2018-10-20 DIAGNOSIS — N183 Chronic kidney disease, stage 3 (moderate): Secondary | ICD-10-CM

## 2018-10-20 DIAGNOSIS — F17210 Nicotine dependence, cigarettes, uncomplicated: Secondary | ICD-10-CM | POA: Diagnosis present

## 2018-10-20 DIAGNOSIS — I5023 Acute on chronic systolic (congestive) heart failure: Secondary | ICD-10-CM | POA: Diagnosis not present

## 2018-10-20 DIAGNOSIS — I509 Heart failure, unspecified: Secondary | ICD-10-CM | POA: Diagnosis not present

## 2018-10-20 DIAGNOSIS — I447 Left bundle-branch block, unspecified: Secondary | ICD-10-CM | POA: Diagnosis present

## 2018-10-20 DIAGNOSIS — G4733 Obstructive sleep apnea (adult) (pediatric): Secondary | ICD-10-CM | POA: Diagnosis present

## 2018-10-20 DIAGNOSIS — R911 Solitary pulmonary nodule: Secondary | ICD-10-CM | POA: Diagnosis present

## 2018-10-20 DIAGNOSIS — Y92239 Unspecified place in hospital as the place of occurrence of the external cause: Secondary | ICD-10-CM | POA: Diagnosis not present

## 2018-10-20 DIAGNOSIS — Z720 Tobacco use: Secondary | ICD-10-CM | POA: Diagnosis not present

## 2018-10-20 DIAGNOSIS — Z6841 Body Mass Index (BMI) 40.0 and over, adult: Secondary | ICD-10-CM | POA: Diagnosis not present

## 2018-10-20 DIAGNOSIS — R0602 Shortness of breath: Secondary | ICD-10-CM

## 2018-10-20 DIAGNOSIS — I370 Nonrheumatic pulmonary valve stenosis: Secondary | ICD-10-CM | POA: Diagnosis not present

## 2018-10-20 DIAGNOSIS — J9601 Acute respiratory failure with hypoxia: Secondary | ICD-10-CM | POA: Diagnosis present

## 2018-10-20 DIAGNOSIS — Z8614 Personal history of Methicillin resistant Staphylococcus aureus infection: Secondary | ICD-10-CM

## 2018-10-20 DIAGNOSIS — Z7902 Long term (current) use of antithrombotics/antiplatelets: Secondary | ICD-10-CM

## 2018-10-20 DIAGNOSIS — I739 Peripheral vascular disease, unspecified: Secondary | ICD-10-CM | POA: Diagnosis present

## 2018-10-20 DIAGNOSIS — E1122 Type 2 diabetes mellitus with diabetic chronic kidney disease: Secondary | ICD-10-CM | POA: Diagnosis present

## 2018-10-20 DIAGNOSIS — Z833 Family history of diabetes mellitus: Secondary | ICD-10-CM

## 2018-10-20 DIAGNOSIS — Z7951 Long term (current) use of inhaled steroids: Secondary | ICD-10-CM

## 2018-10-20 DIAGNOSIS — R609 Edema, unspecified: Secondary | ICD-10-CM

## 2018-10-20 DIAGNOSIS — I5043 Acute on chronic combined systolic (congestive) and diastolic (congestive) heart failure: Secondary | ICD-10-CM | POA: Diagnosis present

## 2018-10-20 DIAGNOSIS — G894 Chronic pain syndrome: Secondary | ICD-10-CM | POA: Diagnosis present

## 2018-10-20 DIAGNOSIS — E875 Hyperkalemia: Secondary | ICD-10-CM | POA: Diagnosis not present

## 2018-10-20 DIAGNOSIS — Z888 Allergy status to other drugs, medicaments and biological substances status: Secondary | ICD-10-CM

## 2018-10-20 DIAGNOSIS — Z5329 Procedure and treatment not carried out because of patient's decision for other reasons: Secondary | ICD-10-CM | POA: Diagnosis present

## 2018-10-20 DIAGNOSIS — R0600 Dyspnea, unspecified: Secondary | ICD-10-CM

## 2018-10-20 DIAGNOSIS — M1990 Unspecified osteoarthritis, unspecified site: Secondary | ICD-10-CM | POA: Diagnosis present

## 2018-10-20 DIAGNOSIS — K219 Gastro-esophageal reflux disease without esophagitis: Secondary | ICD-10-CM | POA: Diagnosis present

## 2018-10-20 DIAGNOSIS — Z8673 Personal history of transient ischemic attack (TIA), and cerebral infarction without residual deficits: Secondary | ICD-10-CM

## 2018-10-20 DIAGNOSIS — E1151 Type 2 diabetes mellitus with diabetic peripheral angiopathy without gangrene: Secondary | ICD-10-CM | POA: Diagnosis present

## 2018-10-20 DIAGNOSIS — Z96641 Presence of right artificial hip joint: Secondary | ICD-10-CM | POA: Diagnosis present

## 2018-10-20 DIAGNOSIS — E785 Hyperlipidemia, unspecified: Secondary | ICD-10-CM | POA: Diagnosis present

## 2018-10-20 DIAGNOSIS — Z9049 Acquired absence of other specified parts of digestive tract: Secondary | ICD-10-CM | POA: Diagnosis not present

## 2018-10-20 DIAGNOSIS — J209 Acute bronchitis, unspecified: Secondary | ICD-10-CM | POA: Diagnosis present

## 2018-10-20 DIAGNOSIS — E1129 Type 2 diabetes mellitus with other diabetic kidney complication: Secondary | ICD-10-CM | POA: Diagnosis present

## 2018-10-20 DIAGNOSIS — N184 Chronic kidney disease, stage 4 (severe): Secondary | ICD-10-CM | POA: Diagnosis present

## 2018-10-20 DIAGNOSIS — E662 Morbid (severe) obesity with alveolar hypoventilation: Secondary | ICD-10-CM | POA: Diagnosis present

## 2018-10-20 DIAGNOSIS — Z8249 Family history of ischemic heart disease and other diseases of the circulatory system: Secondary | ICD-10-CM

## 2018-10-20 DIAGNOSIS — N179 Acute kidney failure, unspecified: Secondary | ICD-10-CM

## 2018-10-20 DIAGNOSIS — Z716 Tobacco abuse counseling: Secondary | ICD-10-CM

## 2018-10-20 DIAGNOSIS — I272 Pulmonary hypertension, unspecified: Secondary | ICD-10-CM | POA: Diagnosis present

## 2018-10-20 DIAGNOSIS — Z79890 Hormone replacement therapy (postmenopausal): Secondary | ICD-10-CM

## 2018-10-20 DIAGNOSIS — I255 Ischemic cardiomyopathy: Secondary | ICD-10-CM | POA: Diagnosis present

## 2018-10-20 DIAGNOSIS — E873 Alkalosis: Secondary | ICD-10-CM | POA: Diagnosis not present

## 2018-10-20 DIAGNOSIS — Z79899 Other long term (current) drug therapy: Secondary | ICD-10-CM

## 2018-10-20 DIAGNOSIS — I361 Nonrheumatic tricuspid (valve) insufficiency: Secondary | ICD-10-CM | POA: Diagnosis not present

## 2018-10-20 DIAGNOSIS — N185 Chronic kidney disease, stage 5: Secondary | ICD-10-CM | POA: Diagnosis not present

## 2018-10-20 LAB — GLUCOSE, CAPILLARY
Glucose-Capillary: 208 mg/dL — ABNORMAL HIGH (ref 70–99)
Glucose-Capillary: 235 mg/dL — ABNORMAL HIGH (ref 70–99)

## 2018-10-20 LAB — COMPREHENSIVE METABOLIC PANEL
ALT: 20 U/L (ref 0–44)
AST: 33 U/L (ref 15–41)
Albumin: 3.6 g/dL (ref 3.5–5.0)
Alkaline Phosphatase: 39 U/L (ref 38–126)
Anion gap: 12 (ref 5–15)
BUN: 55 mg/dL — ABNORMAL HIGH (ref 8–23)
CO2: 28 mmol/L (ref 22–32)
Calcium: 10.1 mg/dL (ref 8.9–10.3)
Chloride: 101 mmol/L (ref 98–111)
Creatinine, Ser: 1.97 mg/dL — ABNORMAL HIGH (ref 0.44–1.00)
GFR calc Af Amer: 30 mL/min — ABNORMAL LOW (ref >60)
GFR calc non Af Amer: 25 mL/min — ABNORMAL LOW (ref >60)
Glucose, Bld: 154 mg/dL — ABNORMAL HIGH (ref 70–99)
Potassium: 3.5 mmol/L (ref 3.5–5.1)
Sodium: 141 mmol/L (ref 135–145)
Total Bilirubin: 0.8 mg/dL (ref 0.3–1.2)
Total Protein: 7 g/dL (ref 6.5–8.1)

## 2018-10-20 LAB — CBC WITH DIFFERENTIAL/PLATELET
Abs Immature Granulocytes: 0.05 10*3/uL (ref 0.00–0.07)
Basophils Absolute: 0 10*3/uL (ref 0.0–0.1)
Basophils Relative: 0 %
Eosinophils Absolute: 0.2 10*3/uL (ref 0.0–0.5)
Eosinophils Relative: 1 %
HCT: 37.2 % (ref 36.0–46.0)
Hemoglobin: 11.2 g/dL — ABNORMAL LOW (ref 12.0–15.0)
Immature Granulocytes: 0 %
Lymphocytes Relative: 12 %
Lymphs Abs: 1.7 10*3/uL (ref 0.7–4.0)
MCH: 25.6 pg — ABNORMAL LOW (ref 26.0–34.0)
MCHC: 30.1 g/dL (ref 30.0–36.0)
MCV: 85.1 fL (ref 80.0–100.0)
Monocytes Absolute: 0.6 10*3/uL (ref 0.1–1.0)
Monocytes Relative: 4 %
Neutro Abs: 11.3 10*3/uL — ABNORMAL HIGH (ref 1.7–7.7)
Neutrophils Relative %: 83 %
Platelets: 356 10*3/uL (ref 150–400)
RBC: 4.37 MIL/uL (ref 3.87–5.11)
RDW: 16.5 % — ABNORMAL HIGH (ref 11.5–15.5)
WBC: 13.8 10*3/uL — ABNORMAL HIGH (ref 4.0–10.5)
nRBC: 0 % (ref 0.0–0.2)

## 2018-10-20 LAB — PROTIME-INR
INR: 0.96
Prothrombin Time: 12.6 seconds (ref 11.4–15.2)

## 2018-10-20 LAB — URINALYSIS, ROUTINE W REFLEX MICROSCOPIC
Bacteria, UA: NONE SEEN
Bilirubin Urine: NEGATIVE
Glucose, UA: NEGATIVE mg/dL
Hgb urine dipstick: NEGATIVE
Ketones, ur: NEGATIVE mg/dL
Leukocytes, UA: NEGATIVE
Nitrite: NEGATIVE
Protein, ur: 30 mg/dL — AB
Specific Gravity, Urine: 1.011 (ref 1.005–1.030)
pH: 5 (ref 5.0–8.0)

## 2018-10-20 LAB — HEMOGLOBIN A1C
Hgb A1c MFr Bld: 7.2 % — ABNORMAL HIGH (ref 4.8–5.6)
Mean Plasma Glucose: 159.94 mg/dL

## 2018-10-20 LAB — CBC
HCT: 37.1 % (ref 36.0–46.0)
Hemoglobin: 11.3 g/dL — ABNORMAL LOW (ref 12.0–15.0)
MCH: 26.5 pg (ref 26.0–34.0)
MCHC: 30.5 g/dL (ref 30.0–36.0)
MCV: 86.9 fL (ref 80.0–100.0)
Platelets: 365 10*3/uL (ref 150–400)
RBC: 4.27 MIL/uL (ref 3.87–5.11)
RDW: 16.5 % — ABNORMAL HIGH (ref 11.5–15.5)
WBC: 13.6 10*3/uL — ABNORMAL HIGH (ref 4.0–10.5)
nRBC: 0 % (ref 0.0–0.2)

## 2018-10-20 LAB — CREATININE, SERUM
Creatinine, Ser: 2.07 mg/dL — ABNORMAL HIGH (ref 0.44–1.00)
GFR calc Af Amer: 28 mL/min — ABNORMAL LOW (ref >60)
GFR calc non Af Amer: 24 mL/min — ABNORMAL LOW (ref >60)

## 2018-10-20 LAB — I-STAT TROPONIN, ED: Troponin i, poc: 0.1 ng/mL (ref 0.00–0.08)

## 2018-10-20 LAB — BRAIN NATRIURETIC PEPTIDE: B Natriuretic Peptide: 500.3 pg/mL — ABNORMAL HIGH (ref 0.0–100.0)

## 2018-10-20 MED ORDER — INSULIN ASPART PROT & ASPART (70-30 MIX) 100 UNIT/ML ~~LOC~~ SUSP
60.0000 [IU] | Freq: Two times a day (BID) | SUBCUTANEOUS | Status: DC
  Filled 2018-10-20: qty 10

## 2018-10-20 MED ORDER — HYDROXYZINE HCL 25 MG PO TABS
25.0000 mg | ORAL_TABLET | Freq: Two times a day (BID) | ORAL | Status: DC | PRN
  Administered 2018-10-23 – 2018-10-30 (×7): 25 mg via ORAL
  Filled 2018-10-20 (×8): qty 1

## 2018-10-20 MED ORDER — ALLOPURINOL 100 MG PO TABS
100.0000 mg | ORAL_TABLET | Freq: Every day | ORAL | Status: DC
  Administered 2018-10-20 – 2018-10-31 (×12): 100 mg via ORAL
  Filled 2018-10-20 (×12): qty 1

## 2018-10-20 MED ORDER — OXYCODONE-ACETAMINOPHEN 10-325 MG PO TABS: 1.0000 | ORAL_TABLET | ORAL | Status: DC | PRN

## 2018-10-20 MED ORDER — MAGNESIUM OXIDE 400 (241.3 MG) MG PO TABS
600.0000 mg | ORAL_TABLET | Freq: Every day | ORAL | Status: DC
  Administered 2018-10-20 – 2018-10-23 (×4): 600 mg via ORAL
  Filled 2018-10-20 (×4): qty 2

## 2018-10-20 MED ORDER — FLUTICASONE PROPIONATE 50 MCG/ACT NA SUSP
2.0000 | NASAL | Status: DC | PRN
  Filled 2018-10-20: qty 16

## 2018-10-20 MED ORDER — FUROSEMIDE 10 MG/ML IJ SOLN
160.0000 mg | Freq: Two times a day (BID) | INTRAVENOUS | Status: DC
  Administered 2018-10-20 – 2018-10-22 (×4): 160 mg via INTRAVENOUS
  Filled 2018-10-20: qty 16
  Filled 2018-10-20: qty 10
  Filled 2018-10-20: qty 4
  Filled 2018-10-20 (×2): qty 16

## 2018-10-20 MED ORDER — ADULT MULTIVITAMIN W/MINERALS CH
1.0000 | ORAL_TABLET | Freq: Every day | ORAL | Status: DC
  Administered 2018-10-20 – 2018-10-31 (×12): 1 via ORAL
  Filled 2018-10-20 (×12): qty 1

## 2018-10-20 MED ORDER — SIMVASTATIN 20 MG PO TABS
20.0000 mg | ORAL_TABLET | Freq: Every day | ORAL | Status: DC
  Administered 2018-10-20 – 2018-10-30 (×11): 20 mg via ORAL
  Filled 2018-10-20 (×11): qty 1

## 2018-10-20 MED ORDER — FENOFIBRATE 160 MG PO TABS
160.0000 mg | ORAL_TABLET | Freq: Every day | ORAL | Status: DC
  Administered 2018-10-20 – 2018-10-31 (×12): 160 mg via ORAL
  Filled 2018-10-20 (×12): qty 1

## 2018-10-20 MED ORDER — CARVEDILOL 3.125 MG PO TABS
3.1250 mg | ORAL_TABLET | Freq: Two times a day (BID) | ORAL | Status: DC
  Administered 2018-10-20 – 2018-10-31 (×22): 3.125 mg via ORAL
  Filled 2018-10-20 (×22): qty 1

## 2018-10-20 MED ORDER — CLOPIDOGREL BISULFATE 75 MG PO TABS
75.0000 mg | ORAL_TABLET | Freq: Every day | ORAL | Status: DC
  Administered 2018-10-20 – 2018-10-31 (×12): 75 mg via ORAL
  Filled 2018-10-20 (×12): qty 1

## 2018-10-20 MED ORDER — SODIUM CHLORIDE 0.9% FLUSH
3.0000 mL | INTRAVENOUS | Status: DC | PRN
  Administered 2018-10-21: 3 mL via INTRAVENOUS
  Filled 2018-10-20: qty 3

## 2018-10-20 MED ORDER — METOLAZONE 5 MG PO TABS
5.0000 mg | ORAL_TABLET | Freq: Two times a day (BID) | ORAL | Status: DC
  Administered 2018-10-20 – 2018-10-21 (×2): 5 mg via ORAL
  Filled 2018-10-20 (×2): qty 1

## 2018-10-20 MED ORDER — SODIUM CHLORIDE 0.9% FLUSH
3.0000 mL | Freq: Two times a day (BID) | INTRAVENOUS | Status: DC
  Administered 2018-10-21 – 2018-10-30 (×14): 3 mL via INTRAVENOUS

## 2018-10-20 MED ORDER — INSULIN ASPART 100 UNIT/ML ~~LOC~~ SOLN
0.0000 [IU] | Freq: Three times a day (TID) | SUBCUTANEOUS | Status: DC
  Administered 2018-10-21: 4 [IU] via SUBCUTANEOUS

## 2018-10-20 MED ORDER — CALCIUM 1200 1200-1000 MG-UNIT PO CHEW: CHEWABLE_TABLET | Freq: Every day | ORAL | Status: DC

## 2018-10-20 MED ORDER — IPRATROPIUM-ALBUTEROL 0.5-2.5 (3) MG/3ML IN SOLN
3.0000 mL | Freq: Once | RESPIRATORY_TRACT | Status: AC
  Administered 2018-10-20: 3 mL via RESPIRATORY_TRACT
  Filled 2018-10-20: qty 3

## 2018-10-20 MED ORDER — ALBUTEROL SULFATE (2.5 MG/3ML) 0.083% IN NEBU
2.5000 mg | INHALATION_SOLUTION | Freq: Four times a day (QID) | RESPIRATORY_TRACT | Status: DC | PRN
  Administered 2018-10-20 – 2018-10-22 (×3): 2.5 mg via RESPIRATORY_TRACT
  Filled 2018-10-20 (×3): qty 3

## 2018-10-20 MED ORDER — OXYCODONE HCL 5 MG PO TABS
5.0000 mg | ORAL_TABLET | ORAL | Status: DC | PRN
  Administered 2018-10-20 – 2018-10-31 (×34): 5 mg via ORAL
  Filled 2018-10-20 (×36): qty 1

## 2018-10-20 MED ORDER — DULAGLUTIDE 1.5 MG/0.5ML ~~LOC~~ SOAJ
1.5000 mg | SUBCUTANEOUS | Status: DC
  Administered 2018-10-20 – 2018-10-27 (×2): 1.5 mg via SUBCUTANEOUS
  Filled 2018-10-20: qty 1

## 2018-10-20 MED ORDER — SPIRONOLACTONE 25 MG PO TABS
25.0000 mg | ORAL_TABLET | Freq: Every day | ORAL | Status: DC
  Administered 2018-10-20 – 2018-10-21 (×2): 25 mg via ORAL
  Filled 2018-10-20 (×2): qty 1

## 2018-10-20 MED ORDER — SODIUM CHLORIDE 0.9 % IV SOLN
250.0000 mL | INTRAVENOUS | Status: DC | PRN
  Administered 2018-10-22: 250 mL via INTRAVENOUS

## 2018-10-20 MED ORDER — ONDANSETRON HCL 4 MG/2ML IJ SOLN
4.0000 mg | Freq: Four times a day (QID) | INTRAMUSCULAR | Status: DC | PRN
  Administered 2018-10-21 – 2018-10-31 (×6): 4 mg via INTRAVENOUS
  Filled 2018-10-20 (×6): qty 2

## 2018-10-20 MED ORDER — CALCIUM CARBONATE-VITAMIN D 500-200 MG-UNIT PO TABS
1.0000 | ORAL_TABLET | Freq: Every day | ORAL | Status: DC
  Administered 2018-10-21: 1 via ORAL
  Filled 2018-10-20 (×2): qty 1

## 2018-10-20 MED ORDER — POTASSIUM CHLORIDE CRYS ER 20 MEQ PO TBCR
40.0000 meq | EXTENDED_RELEASE_TABLET | Freq: Two times a day (BID) | ORAL | Status: DC
  Administered 2018-10-20 – 2018-10-21 (×3): 40 meq via ORAL
  Filled 2018-10-20 (×3): qty 2

## 2018-10-20 MED ORDER — TIZANIDINE HCL 4 MG PO TABS
4.0000 mg | ORAL_TABLET | Freq: Two times a day (BID) | ORAL | Status: DC
  Administered 2018-10-20 – 2018-10-31 (×22): 4 mg via ORAL
  Filled 2018-10-20 (×22): qty 1

## 2018-10-20 MED ORDER — ENOXAPARIN SODIUM 60 MG/0.6ML ~~LOC~~ SOLN
60.0000 mg | SUBCUTANEOUS | Status: DC
  Administered 2018-10-20 – 2018-10-21 (×2): 60 mg via SUBCUTANEOUS
  Filled 2018-10-20 (×2): qty 0.6

## 2018-10-20 MED ORDER — OXYCODONE-ACETAMINOPHEN 5-325 MG PO TABS
1.0000 | ORAL_TABLET | ORAL | Status: DC | PRN
  Administered 2018-10-20 – 2018-10-31 (×36): 1 via ORAL
  Filled 2018-10-20 (×38): qty 1

## 2018-10-20 MED ORDER — LEVOTHYROXINE SODIUM 75 MCG PO TABS
150.0000 ug | ORAL_TABLET | Freq: Every day | ORAL | Status: DC
  Administered 2018-10-21 – 2018-10-31 (×11): 150 ug via ORAL
  Filled 2018-10-20 (×11): qty 2

## 2018-10-20 MED ORDER — MOMETASONE FURO-FORMOTEROL FUM 200-5 MCG/ACT IN AERO
2.0000 | INHALATION_SPRAY | Freq: Two times a day (BID) | RESPIRATORY_TRACT | Status: DC
  Administered 2018-10-20 – 2018-10-31 (×21): 2 via RESPIRATORY_TRACT
  Filled 2018-10-20 (×2): qty 8.8

## 2018-10-20 MED ORDER — ACETAMINOPHEN 325 MG PO TABS
650.0000 mg | ORAL_TABLET | ORAL | Status: DC | PRN
  Administered 2018-10-26: 650 mg via ORAL
  Filled 2018-10-20 (×2): qty 2

## 2018-10-20 MED ORDER — POLYVINYL ALCOHOL 1.4 % OP SOLN
2.0000 [drp] | Freq: Every day | OPHTHALMIC | Status: DC | PRN
  Administered 2018-10-24: 2 [drp] via OPHTHALMIC
  Filled 2018-10-20: qty 15

## 2018-10-20 NOTE — ED Provider Notes (Signed)
[facility] Provider Note   CSN: [id number] Arrival date & time: 10/20/18  0840     History   Chief Complaint Chief Complaint  Patient presents with  . Shortness of Breath    HPI [name] [name] is a 68 y.o. female.  68 year old female with prior medical history as detailed below presents for evaluation of dyspnea.  Patient reports gradually worsening shortness of breath over the last 2 weeks.  She denies cough.  She complains of "chest congestion."  She denies chest pain or fever.  She does report worse symptoms with exertion.  She complains of increased lower extremity edema.  She complains of difficulty sleeping flat.  She reports that she has taken her regular breathing treatments at home without improvement.  Patient denies prior history of CHF.    The history is provided by medical records, a relative and the patient.  Shortness of Breath  Severity:  Moderate Onset quality:  Gradual Duration:  2 weeks Timing:  Constant Progression:  Worsening Chronicity:  New Relieved by:  Nothing Worsened by:  Exertion Ineffective treatments:  None tried Associated symptoms: no chest pain and no fever     Past Medical History:  Diagnosis Date  . Anemia   . Arthritis   . Asthma   . Bronchitis   . Chronic kidney disease   . Chronic kidney disease   . Chronic pain syndrome   . Cramping of feet   . Diabetes mellitus   . GERD (gastroesophageal reflux disease)   . Gout   . Headache   . History of hip replacement, total   . History of transfusion   . Hx MRSA infection    abscess left groin  . Hx of cardiac cath 06/2014  . Hyperlipidemia   . Hypertension   . Hypothyroid   . 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 Morbidly obese ([place])   . Peripheral vascular disease ([place])   . Positive cardiac stress test 12/2013   anterior and lateral ischemia on Myoview  . Sleep apnea    UNABLE TO TOLERATE C PAP  . Stress incontinence   .  Thyroid disease   . TIA (transient ischemic attack)     X2 NO RESIDUAL PROBLEMS    Patient Active Problem List   Diagnosis Date Noted  . 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 Cellulitis of abdominal wall 03/21/2015  . Acute on chronic renal failure ([place]) 03/21/2015  . Type 2 diabetes mellitus ([place]) 03/21/2015  . Abdominal wall abscess 03/21/2015  . 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 Low urine output 08/11/2014  . Chronic systolic CHF (congestive heart failure) ([place]) 08/11/2014  . Asthma, chronic 08/11/2014  . Hypothyroidism 08/11/2014  . OSA (obstructive sleep apnea) 08/11/2014  . Positive cardiac stress test 06/29/2014  . Hyperlipidemia 02/12/2014  . Left arm weakness 12/23/2013  . TIA (transient ischemic attack) 12/23/2013  . Left buttock abscess 10/12/2013  . Cellulitis and abscess of leg, right 04/16/2013  . Acute blood loss anemia 02/14/2013  . HTN (hypertension) 02/14/2013  . Cellulitis and abscess of buttock 02/09/2013  . Morbid obesity ([place]) 02/03/2013  . ARF (acute renal failure) ([place]) 02/03/2013  . Diarrhea 02/03/2013  . Hyponatremia 02/03/2013  . Hypokalemia 02/03/2013  . Sepsis ([place]) 02/02/2013  . DM (diabetes mellitus) ([place]) 02/02/2013  . CKD (chronic kidney disease), stage III ([place]) 02/02/2013  . Cellulitis 02/02/2013  . PAD (  peripheral artery disease) ([place]) 04/26/2011  . Tobacco abuse 04/26/2011    Past Surgical History:  Procedure Laterality Date  . ABDOMINAL HYSTERECTOMY    . COLON SURGERY  1995   DUE TO POLYP  . FOREARM FRACTURE SURGERY     Left arm  . HERNIA REPAIR     w/ mesh  . INCISION AND DRAINAGE ABSCESS Left 02/04/2013   Procedure: INCISION AND DRAINAGE LEFT BUTTOCK ABSCESS; INCISION AND DRAINAGE LEFT BREAST ABSCESS;  Surgeon: Harl Bowie, MD;  Location: [facility];  Service: General;  Laterality: Left;  . INCISION AND DRAINAGE ABSCESS N/A 02/12/2013    Procedure: INCISION AND DEBRIDEMENT BUTTOCK WOUND ;  Surgeon: [name]. Georgette Dover, MD;  Location: [facility];  Service: General;  Laterality: N/A;  . INCISION AND DRAINAGE ABSCESS N/A 02/14/2013   Procedure: INCISION AND DRAINAGE/DRESSING CHANGE;  Surgeon: Harl Bowie, MD;  Location: [facility];  Service: General;  Laterality: N/A;  . INCISION AND DRAINAGE ABSCESS N/A 03/21/2015   Procedure: INCISION AND DRAINAGE PUBIC ABSCESS;  Surgeon: Excell Seltzer, MD;  Location: [facility];  Service: General;  Laterality: N/A;  . INCISION AND DRAINAGE PERIRECTAL ABSCESS Left 02/10/2013   Procedure: IRRIGATION AND DEBRIDEMENT OF BUTTOCK/PERINEAL ABSCESS;  Surgeon: [name]. Georgette Dover, MD;  Location: [facility];  Service: General;  Laterality: Left;  . INCISION AND DRAINAGE PERIRECTAL ABSCESS N/A 02/16/2013   Procedure: IRRIGATION AND DEBRIDEMENT PERINEAL ABSCESS;  Surgeon: Zenovia Jarred, MD;  Location: [facility];  Service: General;  Laterality: N/A;  . IRRIGATION AND DEBRIDEMENT ABSCESS N/A 02/06/2013   Procedure: IRRIGATION AND DEBRIDEMENT BUTTOCK ABSCESS AND DRESSING CHANGE;  Surgeon: Harl Bowie, MD;  Location: [facility];  Service: General;  Laterality: N/A;  . IRRIGATION AND DEBRIDEMENT ABSCESS Left 02/08/2013   Procedure: IRRIGATION AND DEBRIDEMENT ABSCESS/DRESSING CHANGE;  Surgeon: Gwenyth Ober, MD;  Location: [facility];  Service: General;  Laterality: Left;  . JOINT REPLACEMENT  2010 / 2012   . LAPAROSCOPIC CHOLECYSTECTOMY    . LEFT HEART CATHETERIZATION WITH CORONARY ANGIOGRAM N/A 07/02/2014   Procedure: LEFT HEART CATHETERIZATION WITH CORONARY ANGIOGRAM;  Surgeon: Lorretta Harp, MD;  Location: [facility] C[facility];  Service: Cardiovascular;  Laterality: N/A;  . LOWER EXTREMITY ANGIOGRAM Right 04/20/2016   Procedure: Lower Extremity Angiogram;  Surgeon: Elam Dutch, MD;  Location: [facility] CV LAB;  Service: Cardiovascular;  Laterality: Right;  . PERIPHERAL VASCULAR CATHETERIZATION N/A 04/20/2016   Procedure: Abdominal  Aortogram;  Surgeon: Elam Dutch, MD;  Location: [facility];  Service: Cardiovascular;  Laterality: N/A;  . PERIPHERAL VASCULAR CATHETERIZATION Right 04/20/2016   Procedure: Peripheral Vascular Intervention;  Surgeon: Elam Dutch, MD;  Location: [facility] CV [facility];  Service: Cardiovascular;  Laterality: Right;  popiteal  . THYROIDECTOMY    . TOTAL HIP REVISION Right 08/11/2014   Procedure: RIGHT ACETABULAR REVISION;  Surgeon: Gearlean Alf, MD;  Location: [facility];  Service: Orthopedics;  Laterality: Right;     OB History   No obstetric history on file.      Home Medications    Prior to Admission medications   Medication Sig Start Date End Date Taking? Authorizing Provider  albuterol (PROVENTIL) (2.5 MG/3ML) 0.083% nebulizer solution Take 2.5 mg by nebulization every 6 (six) hours as needed for wheezing or shortness of breath.    [provider]  allopurinol (ZYLOPRIM) 100 MG tablet Take 100 mg by mouth every morning.     [provider]  B Complex Vitamins (  VITAMIN B COMPLEX PO) Take 1 tablet by mouth 2 (two) times daily.     [provider]  budesonide-formoterol (SYMBICORT) 160-4.5 MCG/ACT inhaler Inhale 2 puffs into the lungs 2 (two) times daily as needed (for SOB).     [provider]  Calcium Carbonate-Vit D-Min (CALCIUM 1200 PO) Take by mouth.    [provider]  cetirizine (ZYRTEC) 10 MG tablet Take 10 mg by mouth daily as needed for allergies.  03/14/15   [provider]  clopidogrel (PLAVIX) 75 MG tablet TAKE 1 TABLET BY MOUTH EVERY DAY 06/18/17   Waynetta Sandy, MD  Dulaglutide 1.5 MG/0.5ML SOPN Inject into the skin.    [provider]  fenofibrate (TRICOR) 145 MG tablet TAKE 1 TABLET (145 MG TOTAL) BY MOUTH DAILY. 05/11/15   Lorretta Harp, MD  fluticasone (FLONASE) 50 MCG/ACT nasal spray Place 2 sprays into the nose as needed for allergies.     [provider]  furosemide (LASIX)  80 MG tablet Take 160 mg by mouth 2 (two) times daily.    [provider]  insulin lispro protamine-lispro (HUMALOG 75/25 MIX) (75-25) 100 UNIT/ML SUSP injection Inject 70 Units into the skin 2 (two) times daily with a meal.     [provider]  levothyroxine (SYNTHROID, LEVOTHROID) 150 MCG tablet Take 150 mcg by mouth daily before breakfast.    [provider]  MAGNESIUM PO Take 600 mg by mouth daily.    [provider]  metolazone (ZAROXOLYN) 2.5 MG tablet TK 1 T PO BID B TAKING LASIX 08/15/17   [provider]  mirabegron ER (MYRBETRIQ) 50 MG TB24 tablet Take 50 mg by mouth daily.    [provider]  Multiple Vitamin (MULTIVITAMIN WITH MINERALS) TABS tablet Take 1 tablet by mouth daily.    [provider]  naftifine (NAFTIN) 1 % cream APPLY TO THE AFFECTED AREA ON SKIN EVERY DAY Patient taking differently: APPLY TO THE AFFECTED AREA ON SKIN AS NEEDED FOR IRRITATION 05/23/15   Hyatt, Max T, DPM  oxyCODONE-acetaminophen (PERCOCET) 10-325 MG tablet Take 1 tablet by mouth every 4 (four) hours as needed for pain.    [provider]  polyvinyl alcohol (LIQUIFILM TEARS) 1.4 % ophthalmic solution Place 2 drops into both eyes daily as needed for dry eyes.    [provider]  potassium chloride SA (K-DUR,KLOR-CON) 20 MEQ tablet Take 40 mEq by mouth 2 (two) times daily.     [provider]  simvastatin (ZOCOR) 20 MG tablet Take 20 mg by mouth at bedtime. 04/08/17   [provider]  tiZANidine (ZANAFLEX) 4 MG capsule Take 4 mg by mouth 2 (two) times daily.     [provider]    Family History Family History  Problem Relation Age of Onset  . Diabetes Brother   . Cardiomyopathy Mother   . Heart disease Mother   . Cancer Father     Social History Social History   Tobacco Use  . Smoking status: Current Every Day Smoker    Packs/day: 0.25    Years: 40.00    Pack years: 10.00    Types:  Cigarettes  . Smokeless tobacco: Never Used  Substance Use Topics  . Alcohol use: Yes    Alcohol/week: 1.0 standard drinks    Types: 1 Standard drinks or equivalent per week    Comment: OCC  . Drug use: No    Comment: Quit four years ago.  Allergies   Nebivolol; Ace inhibitors; Bystolic [nebivolol hcl]; and Morphine and related   Review of Systems Review of Systems  Constitutional: Negative for fever.  Respiratory: Positive for shortness of breath.   Cardiovascular: Negative for chest pain.  All other systems reviewed and are negative.    Physical Exam Updated Vital Signs BP (!) 175/83 (BP Location: Right Arm)   Pulse 94   Temp 99 F (37.2 C) (Oral)   Resp 20   SpO2 94%   Physical Exam Vitals signs and nursing note reviewed.  Constitutional:      General: She is not in acute distress.    Appearance: She is well-developed. She is obese.  HENT:     Head: Normocephalic and atraumatic.  Eyes:     Conjunctiva/sclera: Conjunctivae normal.     Pupils: Pupils are equal, round, and reactive to light.  Neck:     Musculoskeletal: Normal range of motion and neck supple.  Cardiovascular:     Rate and Rhythm: Normal rate and regular rhythm.     Heart sounds: Normal heart sounds.  Pulmonary:     Effort: Pulmonary effort is normal. No respiratory distress.     Breath sounds: Examination of the right-middle field reveals decreased breath sounds. Examination of the left-middle field reveals decreased breath sounds. Examination of the right-lower field reveals decreased breath sounds. Examination of the left-lower field reveals decreased breath sounds. Decreased breath sounds present.  Abdominal:     General: There is no distension.     Palpations: Abdomen is soft.     Tenderness: There is no abdominal tenderness.  Musculoskeletal: Normal range of motion.        General: No deformity.  Skin:    General: Skin is warm and dry.  Neurological:     Mental Status: She is alert  and oriented to person, place, and time.      ED Treatments / Results  Labs (all labs ordered are listed, but only abnormal results are displayed) Labs Reviewed  COMPREHENSIVE METABOLIC PANEL - Abnormal; Notable for the following components:      Result Value   Glucose, Bld 154 (*)    BUN 55 (*)    Creatinine, Ser 1.97 (*)    GFR calc non Af Amer 25 (*)    GFR calc Af Amer 30 (*)    All other components within normal limits  CBC WITH DIFFERENTIAL/PLATELET - Abnormal; Notable for the following components:   WBC 13.8 (*)    Hemoglobin 11.2 (*)    MCH 25.6 (*)    RDW 16.5 (*)    Neutro Abs 11.3 (*)    All other components within normal limits  BRAIN NATRIURETIC PEPTIDE - Abnormal; Notable for the following components:   B Natriuretic Peptide 500.3 (*)    All other components within normal limits  I-STAT TROPONIN, ED - Abnormal; Notable for the following components:   Troponin i, poc 0.10 (*)    All other components within normal limits  PROTIME-INR  URINALYSIS, ROUTINE W REFLEX MICROSCOPIC  I-STAT VENOUS BLOOD GAS, ED    EKG EKG Interpretation  Date/Time:  Monday October 20 2018 08:49:46 EST Ventricular Rate:  96 PR Interval:    QRS Duration: 134 QT Interval:  402 QTC Calculation: 508 R Axis:   -45 Text Interpretation:  Sinus rhythm Nonspecific IVCD with LAD Left ventricular hypertrophy ST elevation, consider inferior injury Confirmed by Dene Gentry [phone]) on 10/20/2018 8:52:01 AM   Radiology Dg Chest 1 View  Result Date: 10/20/2018 CLINICAL DATA:  Dyspnea EXAM: CHEST  1 VIEW COMPARISON:  04/17/2015 FINDINGS: Cardiac shadow is enlarged but stable. Aortic calcifications are again seen. The lungs are well aerated bilaterally without focal infiltrate or sizable effusion. No acute bony abnormality is seen. IMPRESSION: No acute abnormality noted. Electronically Signed   By: Inez Catalina M.D.   On: 10/20/2018 09:15   Vas [name] Lower Extremity Venous (dvt) (only Mc & Wl  7a-7p)  Result Date: 10/20/2018  Lower Venous Study Indications: SOB, and Edema. Other Indications: Morbid obesity. Risk Factors: Tobacco abuse, PAD. Limitations: Body habitus and patient would not allow compression. Comparison Study: No prior study on file Performing Technologist: Sharion Dove RVS  Examination Guidelines: A complete evaluation includes B-mode imaging, spectral Doppler, color Doppler, and power Doppler as needed of all accessible portions of each vessel. Bilateral testing is considered an integral part of a complete examination. Limited examinations for reoccurring indications may be performed as noted.  Right Venous Findings: +---------+---------------+---------+-----------+----------+--------------+          CompressibilityPhasicitySpontaneityPropertiesSummary        +---------+---------------+---------+-----------+----------+--------------+ CFV                     Yes      Yes                                 +---------+---------------+---------+-----------+----------+--------------+ FV Prox  Full           Yes      Yes                                 +---------+---------------+---------+-----------+----------+--------------+ FV Mid                  Yes      Yes                  visualized     +---------+---------------+---------+-----------+----------+--------------+ FV Distal               Yes      Yes                  visualized     +---------+---------------+---------+-----------+----------+--------------+ PFV                                                   Not visualized +---------+---------------+---------+-----------+----------+--------------+ POP      Full           Yes      Yes                                 +---------+---------------+---------+-----------+----------+--------------+ PTV                                                   Not visualized +---------+---------------+---------+-----------+----------+--------------+  PERO  Not visualized +---------+---------------+---------+-----------+----------+--------------+  Left Venous Findings: +---------+---------------+---------+-----------+----------+--------------+          CompressibilityPhasicitySpontaneityPropertiesSummary        +---------+---------------+---------+-----------+----------+--------------+ CFV                     Yes      Yes                  visualized     +---------+---------------+---------+-----------+----------+--------------+ FV Prox                 Yes      Yes                  visualized     +---------+---------------+---------+-----------+----------+--------------+ FV Mid                  Yes      Yes                  visualized     +---------+---------------+---------+-----------+----------+--------------+ FV Distal               Yes      Yes                  visualized     +---------+---------------+---------+-----------+----------+--------------+ PFV                                                   Not visualized +---------+---------------+---------+-----------+----------+--------------+ POP      Full           Yes      Yes                                 +---------+---------------+---------+-----------+----------+--------------+ PTV                                                   visualized     +---------+---------------+---------+-----------+----------+--------------+ PERO                                                  Not visualized +---------+---------------+---------+-----------+----------+--------------+    Summary: Right: There is no evidence of deep vein thrombosis in the lower extremity. However, portions of this examination were limited- see technologist comments above. Left: There is no evidence of deep vein thrombosis in the lower extremity. However, portions of this examination were limited- see technologist comments  above.  *See table(s) above for measurements and observations.    Preliminary     Procedures Procedures (including critical care time)  Medications Ordered in ED Medications  ipratropium-albuterol (DUONEB) 0.5-2.5 (3) MG/3ML nebulizer solution 3 mL (has no administration in time range)     Initial Impression / Assessment and Plan / ED Course  I have reviewed the triage vital signs and the nursing notes.  Pertinent labs & imaging results that were available during my care of the patient were reviewed by me and considered in my medical decision making (see chart for details).     MDM  Screen  complete  Patient is presenting for evaluation of gradually worsening dyspnea.  Symptoms are concerning for possible CHF exacerbation.  Patient noted to have a mildly elevated troponin at 0.1 with renal dysfunction apparently close to baseline.  EKG is without evidence of acute ischemia.  Chest x-ray does not demonstrate clear fluid overload but given patient's body habitus this is somewhat unreliable.  Patient likely would benefit from admission for further work-up and treatment.  Dr. Evangeline Gula of the hospitalist service is aware of case and will evaluate for admission.  Final Clinical Impressions(s) / ED Diagnoses   Final diagnoses:  Dyspnea, unspecified type    ED Discharge Orders    None       Valarie Merino, MD 10/20/18 1208

## 2018-10-20 NOTE — ED Triage Notes (Signed)
Pt in from home with worsening sob x 1 wk. C/o cough, has wheezing and leg swelling bilaterally. Able to speak in short sentences, sats 93% on RA.

## 2018-10-20 NOTE — Progress Notes (Signed)
VASCULAR LAB PRELIMINARY  PRELIMINARY  PRELIMINARY  PRELIMINARY  Bilateral lower extremity venous duplex completed.    Preliminary report:  There is no obvious evidence of DVT or SVT noted in the visualized veins of the bilateral lower extremities.  See CV Proc for preliminary report.  Gave results to Dr. Thera Flake, [facility], RVT 10/20/2018, 10:58 AM

## 2018-10-20 NOTE — ED Notes (Signed)
Dr. Francia Greaves notified of elevated I-stat trop of 0.10

## 2018-10-20 NOTE — H&P (Signed)
History and Physical    [name] [name]:[id number] DOB: August 18, 1951 DOA: 10/20/2018  PCP: [name], MD   Patient consultants: Dr. Gwenlyn Found from cardiology  Patient coming from: Home  I have personally briefly reviewed patient's old medical records in [place]  Chief Complaint: Shortness of breath worse in the past couple of weeks  HPI: [name] [name] is a 68 y.o. female with medical history significant of diabetes type II, chronic kidney disease stage III, hyperlipidemia, hypertension, and hypothyroidism and obstructive sleep apnea who presents to the emergency department for evaluation of dyspnea.  She has had gradually worsening shortness of breath over the past 2 weeks.  She has had no cough.  She complains of chest congestions but no sputum production she has had no chest pain or fever.  Symptoms are worse with exertion and significantly worse with lying flat she has now developed 3 pillow orthopnea and significant PND.  She is waking up every night breath.  Seated symptoms include worsening lower extremity edema for the past week.  She has taken her regular breathing treatments at home without any improvement.  Patient is also complaining of associated right arm pain that she says is coming from the swelling.  ED Course: X-ray shows no acute cardiopulmonary abnormality but on my review there is cardiomegaly with increase in volume when compared to 2016.  EKG is unremarkable troponin is 0.10, BNP is elevated at 500 previously was measured at 308 years ago, creatinine is 1.97 which appears to be somewhere near her baseline.  Blood pressure is markedly elevated.  Is improved in the ER from 175/83 on presentation to 157/81.  Patient to be admitted to the hospital for further evaluation and management of acute systolic congestive heart failure.  Review of Systems: As per HPI otherwise all other systems reviewed and  negative.    Past Medical History:  Diagnosis Date  .  Anemia   . Arthritis   . Asthma   . Bronchitis   . Chronic kidney disease   . Chronic kidney disease   . Chronic pain syndrome   . Cramping of feet   . Diabetes mellitus   . GERD (gastroesophageal reflux disease)   . Gout   . Headache   . History of hip replacement, total   . History of transfusion   . Hx MRSA infection    abscess left groin  . Hx of cardiac cath 06/2014  . Hyperlipidemia   . Hypertension   . Hypothyroid   . 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 Morbidly obese ([place])   . Peripheral vascular disease ([place])   . Positive cardiac stress test 12/2013   anterior and lateral ischemia on Myoview  . Sleep apnea    UNABLE TO TOLERATE C PAP  . Stress incontinence   . Thyroid disease   . TIA (transient ischemic attack)     X2 NO RESIDUAL PROBLEMS    Past Surgical History:  Procedure Laterality Date  . ABDOMINAL HYSTERECTOMY    . COLON SURGERY  1995   DUE TO POLYP  . FOREARM FRACTURE SURGERY     Left arm  . HERNIA REPAIR     w/ mesh  . INCISION AND DRAINAGE ABSCESS Left 02/04/2013   Procedure: INCISION AND DRAINAGE LEFT BUTTOCK ABSCESS; INCISION AND DRAINAGE LEFT BREAST ABSCESS;  Surgeon: Harl Bowie, MD;  Location: [facility];  Service: General;  Laterality: Left;  . INCISION AND DRAINAGE ABSCESS N/A 02/12/2013  Procedure: INCISION AND DEBRIDEMENT BUTTOCK WOUND ;  Surgeon: [name]. Georgette Dover, MD;  Location: [facility];  Service: General;  Laterality: N/A;  . INCISION AND DRAINAGE ABSCESS N/A 02/14/2013   Procedure: INCISION AND DRAINAGE/DRESSING CHANGE;  Surgeon: Harl Bowie, MD;  Location: [place];  Service: General;  Laterality: N/A;  . INCISION AND DRAINAGE ABSCESS N/A 03/21/2015   Procedure: INCISION AND DRAINAGE PUBIC ABSCESS;  Surgeon: Excell Seltzer, MD;  Location: [facility];  Service: General;  Laterality: N/A;  . INCISION AND DRAINAGE PERIRECTAL ABSCESS Left 02/10/2013   Procedure: IRRIGATION AND DEBRIDEMENT OF BUTTOCK/PERINEAL ABSCESS;  Surgeon: [name].  Georgette Dover, MD;  Location: [facility];  Service: General;  Laterality: Left;  . INCISION AND DRAINAGE PERIRECTAL ABSCESS N/A 02/16/2013   Procedure: IRRIGATION AND DEBRIDEMENT PERINEAL ABSCESS;  Surgeon: Zenovia Jarred, MD;  Location: [place];  Service: General;  Laterality: N/A;  . IRRIGATION AND DEBRIDEMENT ABSCESS N/A 02/06/2013   Procedure: IRRIGATION AND DEBRIDEMENT BUTTOCK ABSCESS AND DRESSING CHANGE;  Surgeon: Harl Bowie, MD;  Location: [facility];  Service: General;  Laterality: N/A;  . IRRIGATION AND DEBRIDEMENT ABSCESS Left 02/08/2013   Procedure: IRRIGATION AND DEBRIDEMENT ABSCESS/DRESSING CHANGE;  Surgeon: Gwenyth Ober, MD;  Location: [place];  Service: General;  Laterality: Left;  . JOINT REPLACEMENT  2010 / 2012   . LAPAROSCOPIC CHOLECYSTECTOMY    . LEFT HEART CATHETERIZATION WITH CORONARY ANGIOGRAM N/A 07/02/2014   Procedure: LEFT HEART CATHETERIZATION WITH CORONARY ANGIOGRAM;  Surgeon: Lorretta Harp, MD;  Location: [facility];  Service: Cardiovascular;  Laterality: N/A;  . LOWER EXTREMITY ANGIOGRAM Right 04/20/2016   Procedure: Lower Extremity Angiogram;  Surgeon: Elam Dutch, MD;  Location: [facility];  Service: Cardiovascular;  Laterality: Right;  . PERIPHERAL VASCULAR CATHETERIZATION N/A 04/20/2016   Procedure: Abdominal Aortogram;  Surgeon: Elam Dutch, MD;  Location: [place] [facility];  Service: Cardiovascular;  Laterality: N/A;  . PERIPHERAL VASCULAR CATHETERIZATION Right 04/20/2016   Procedure: Peripheral Vascular Intervention;  Surgeon: Elam Dutch, MD;  Location: [place] [facility];  Service: Cardiovascular;  Laterality: Right;  popiteal  . THYROIDECTOMY    . TOTAL HIP REVISION Right 08/11/2014   Procedure: RIGHT ACETABULAR REVISION;  Surgeon: Gearlean Alf, MD;  Location: [facility];  Service: Orthopedics;  Laterality: Right;    Social History   Social History Narrative   Patient is separated  and lives at home with her friend. Patient is disabled.    Education one year of college   Right handed   Caffeine two cups daily.        reports that she has been smoking cigarettes. She has a 10.00 pack-year smoking history. She has never used smokeless tobacco. She reports current alcohol use of about 1.0 standard drinks of alcohol per week. She reports that she does not use drugs.  Allergies  Allergen Reactions  . Nebivolol Swelling  . Ace Inhibitors Swelling and Other (See Comments)    Tongue swell  . Bystolic [Nebivolol Hcl] Swelling and Other (See Comments)    Chest pain   . Morphine And Related Itching    Family History  Problem Relation Age of Onset  . Diabetes Brother   . Cardiomyopathy Mother   . Heart disease Mother   . Cancer Father     Prior to Admission medications   Medication Sig Start Date End Date Taking? Authorizing Provider  albuterol (PROVENTIL) (2.5 MG/3ML) 0.083% nebulizer solution Take 2.5 mg by nebulization  every 6 (six) hours as needed for wheezing or shortness of breath.   Yes [provider]  allopurinol (ZYLOPRIM) 100 MG tablet Take 100 mg by mouth every morning.    Yes [provider]  B Complex Vitamins (VITAMIN B COMPLEX PO) Take 1 tablet by mouth 2 (two) times daily.    Yes [provider]  budesonide-formoterol (SYMBICORT) 160-4.5 MCG/ACT inhaler Inhale 2 puffs into the lungs 2 (two) times daily as needed (for SOB).    Yes [provider]  Calcium Carbonate-Vit D-Min (CALCIUM 1200 PO) Take 1,200 mg by mouth daily.    Yes [provider]  cetirizine (ZYRTEC) 10 MG tablet Take 10 mg by mouth daily as needed for allergies.  03/14/15  Yes [provider]  clopidogrel (PLAVIX) 75 MG tablet TAKE 1 TABLET BY MOUTH EVERY DAY Patient taking differently: Take 75 mg by mouth daily.  06/18/17  Yes Waynetta Sandy, MD  Dulaglutide 1.5 MG/0.5ML SOPN Inject 1.5 mg into the skin every Monday.    Yes [provider]  fenofibrate (TRICOR) 145 MG tablet  TAKE 1 TABLET (145 MG TOTAL) BY MOUTH DAILY. Patient taking differently: Take 145 mg by mouth daily.  05/11/15  Yes Lorretta Harp, MD  fluticasone (FLONASE) 50 MCG/ACT nasal spray Place 2 sprays into the nose as needed for allergies.    Yes [provider]  furosemide (LASIX) 80 MG tablet Take 160 mg by mouth 2 (two) times daily.   Yes [provider]  hydrOXYzine (ATARAX/VISTARIL) 25 MG tablet Take 25 mg by mouth 2 (two) times daily as needed for itching.   Yes [provider]  insulin lispro protamine-lispro (HUMALOG 75/25 MIX) (75-25) 100 UNIT/ML SUSP injection Inject 70 Units into the skin 2 (two) times daily with a meal.    Yes [provider]  levothyroxine (SYNTHROID, LEVOTHROID) 150 MCG tablet Take 150 mcg by mouth daily before breakfast.   Yes [provider]  MAGNESIUM PO Take 600 mg by mouth daily.   Yes [provider]  metolazone (ZAROXOLYN) 2.5 MG tablet Take 2.5 mg by mouth 2 (two) times daily. Take with laix 08/15/17  Yes [provider]  mirabegron ER (MYRBETRIQ) 50 MG TB24 tablet Take 50 mg by mouth daily.   Yes [provider]  Multiple Vitamin (MULTIVITAMIN WITH MINERALS) TABS tablet Take 1 tablet by mouth daily.   Yes [provider]  naftifine (NAFTIN) 1 % cream APPLY TO THE AFFECTED AREA ON SKIN EVERY DAY Patient taking differently: Apply 1 application topically daily as needed (skin irritation).  05/23/15  Yes Hyatt, Max T, DPM  oxyCODONE-acetaminophen (PERCOCET) 10-325 MG tablet Take 1 tablet by mouth every 4 (four) hours as needed for pain.   Yes [provider]  polyvinyl alcohol (LIQUIFILM TEARS) 1.4 % ophthalmic solution Place 2 drops into both eyes daily as needed for dry eyes.   Yes [provider]  potassium chloride SA (K-DUR,KLOR-CON) 20 MEQ tablet Take 40 mEq by mouth 2 (two) times daily.    Yes [provider]  simvastatin (ZOCOR) 20 MG tablet Take 20 mg by  mouth at bedtime. 04/08/17  Yes [provider]  tiZANidine (ZANAFLEX) 4 MG capsule Take 4 mg by mouth 2 (two) times daily.    Yes [provider]    Physical Exam:  Constitutional: Sitting bolt upright in the bed with difficulty forming complete sentences due to dyspnea.  Mild accessory muscle use. Vitals:   10/20/18  1100 10/20/18 1130 10/20/18 1215 10/20/18 1230  BP: (!) 157/98 (!) 173/87 (!) 157/81 (!) 148/82  Pulse: 88 94 90 93  Resp: (!) 23 (!) 21 17 (!) 24  Temp:      TempSrc:      SpO2: 95% 95% 100% 97%  Weight:       Eyes: PERRL, lids and conjunctivae normal ENMT: Mucous membranes are moist. Posterior pharynx clear of any exudate or lesions.Normal dentition.  Neck: normal, supple, no masses, no thyromegaly Respiratory:   Bilateral rales halfway up the lung fields, with end expiratory wheezing which are faint consistent with cardiac wheezes, no rhonchi appreciated, no dullness to percussion.  Adequate air movement. Cardiovascular: Regular rate and rhythm, no murmurs / rubs / gallops.  3+ extremity edema. 2+ pedal pulses. No carotid bruits.,  No JVD Abdomen: no tenderness, no masses palpated. No hepatosplenomegaly. Bowel sounds positive.  Musculoskeletal: no clubbing / cyanosis. No joint deformity upper and lower extremities. Good ROM, no contractures. Normal muscle tone.  Skin: no rashes, lesions, ulcers. No induration Neurologic: CN 2-12 grossly intact. Sensation intact, DTR normal. Strength 5/5 in all 4.  Psychiatric: Normal judgment and insight. Alert and oriented x 3. Normal mood.   Labs on Admission: I have personally reviewed following labs and imaging studies  CBC: Recent Labs  Lab 10/20/18 0853  WBC 13.8*  NEUTROABS 11.3*  HGB 11.2*  HCT 37.2  MCV 85.1  PLT 297   Basic Metabolic Panel: Recent Labs  Lab 10/20/18 0853  NA 141  K 3.5  CL 101  CO2 28  GLUCOSE 154*  BUN 55*  CREATININE 1.97*  CALCIUM 10.1   GFR: CrCl cannot be  calculated (Unknown ideal weight.). Liver Function Tests: Recent Labs  Lab 10/20/18 0853  AST 33  ALT 20  ALKPHOS 39  BILITOT 0.8  PROT 7.0  ALBUMIN 3.6   No results for input(s): LIPASE, AMYLASE in the last 168 hours. No results for input(s): AMMONIA in the last 168 hours. Coagulation Profile: Recent Labs  Lab 10/20/18 0853  INR 0.96   Urine analysis:    Component Value Date/Time   COLORURINE YELLOW 05/06/2017 1616   APPEARANCEUR HAZY (A) 05/06/2017 1616   LABSPEC 1.014 05/06/2017 1616   PHURINE 5.0 05/06/2017 1616   GLUCOSEU 150 (A) 05/06/2017 1616   HGBUR NEGATIVE 05/06/2017 1616   BILIRUBINUR NEGATIVE 05/06/2017 1616   KETONESUR NEGATIVE 05/06/2017 1616   PROTEINUR NEGATIVE 05/06/2017 1616   UROBILINOGEN 0.2 08/04/2014 1409   NITRITE NEGATIVE 05/06/2017 1616   LEUKOCYTESUR NEGATIVE 05/06/2017 1616    Radiological Exams on Admission: Dg Chest 1 View  Result Date: 10/20/2018 CLINICAL DATA:  Dyspnea EXAM: CHEST  1 VIEW COMPARISON:  04/17/2015 FINDINGS: Cardiac shadow is enlarged but stable. Aortic calcifications are again seen. The lungs are well aerated bilaterally without focal infiltrate or sizable effusion. No acute bony abnormality is seen. IMPRESSION: No acute abnormality noted. Electronically Signed   By: Inez Catalina M.D.   On: 10/20/2018 09:15   Vas [name] Lower Extremity Venous (dvt) (only Mc & Wl 7a-7p)  Result Date: 10/20/2018  Lower Venous Study Indications: SOB, and Edema. Other Indications: Morbid obesity. Risk Factors: Tobacco abuse, PAD. Limitations: Body habitus and patient would not allow compression. Comparison Study: No prior study on file Performing Technologist: Sharion Dove RVS  Examination Guidelines: A complete evaluation includes B-mode imaging, spectral Doppler, color Doppler, and power Doppler as needed of all accessible portions of each vessel. Bilateral testing is considered an  integral part of a complete examination. Limited examinations for  reoccurring indications may be performed as noted.  Right Venous Findings: +---------+---------------+---------+-----------+----------+--------------+          CompressibilityPhasicitySpontaneityPropertiesSummary        +---------+---------------+---------+-----------+----------+--------------+ CFV                     Yes      Yes                                 +---------+---------------+---------+-----------+----------+--------------+ FV Prox  Full           Yes      Yes                                 +---------+---------------+---------+-----------+----------+--------------+ FV Mid                  Yes      Yes                  visualized     +---------+---------------+---------+-----------+----------+--------------+ FV Distal               Yes      Yes                  visualized     +---------+---------------+---------+-----------+----------+--------------+ PFV                                                   Not visualized +---------+---------------+---------+-----------+----------+--------------+ POP      Full           Yes      Yes                                 +---------+---------------+---------+-----------+----------+--------------+ PTV                                                   Not visualized +---------+---------------+---------+-----------+----------+--------------+ PERO                                                  Not visualized +---------+---------------+---------+-----------+----------+--------------+  Left Venous Findings: +---------+---------------+---------+-----------+----------+--------------+          CompressibilityPhasicitySpontaneityPropertiesSummary        +---------+---------------+---------+-----------+----------+--------------+ CFV                     Yes      Yes                  visualized     +---------+---------------+---------+-----------+----------+--------------+ FV Prox                  Yes      Yes                  visualized     +---------+---------------+---------+-----------+----------+--------------+ FV Mid  Yes      Yes                  visualized     +---------+---------------+---------+-----------+----------+--------------+ FV Distal               Yes      Yes                  visualized     +---------+---------------+---------+-----------+----------+--------------+ PFV                                                   Not visualized +---------+---------------+---------+-----------+----------+--------------+ POP      Full           Yes      Yes                                 +---------+---------------+---------+-----------+----------+--------------+ PTV                                                   visualized     +---------+---------------+---------+-----------+----------+--------------+ PERO                                                  Not visualized +---------+---------------+---------+-----------+----------+--------------+    Summary: Right: There is no evidence of deep vein thrombosis in the lower extremity. However, portions of this examination were limited- see technologist comments above. Left: There is no evidence of deep vein thrombosis in the lower extremity. However, portions of this examination were limited- see technologist comments above.  *See table(s) above for measurements and observations.    Preliminary     EKG: Independently reviewed.  Sinus rhythm with nonspecific interventricular conduction delay with left axis deviation and LVH slightly elevated STs.  When compared to prior no significant change.  Chest x-ray: My personal reading of the chest x-ray shows no acute cardiopulmonary abnormality but increased vascular markings and cardiomegaly cardiomegaly is unchanged since 2016 but concerning for CHF.  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  Acute on chronic systolic congestive heart failure New York Heart Association class III: Patient takes 160 mg twice a day of Lasix at home along with metolazone.  Based on her history I suspect that she is drinking as much liquids as she feels she needs.  I do not think that she is observing a fluid restriction.  She has not been weighing herself daily.  Patient will be started on Lasix 160 IV every 12 hours as well as increasing metolazone  to 5 mg twice daily 30 minutes prior to Lasix.  We will do strict I's and O's and a 1200 mL fluid restriction.  If patient is not responding to current therapy may require cardiology consultation.  2.  Hypertensive heart and renal disease stage III with unspecified chronic kidney disease with heart failure: Concerned the patient may be developing cardiorenal syndrome.  We will need to watch her creatinine very closely with this increased level of diuresis.  On her to closely may require cardiology or renal consultation.  3.  Chronic kidney disease stage III: Avoid nephrotoxic agents monitor renal function closely measure ins and outs and daily weights.  4.  Diabetes type II with renal complications: Continue home insulin regimen.  Will check stick blood glucoses before meals and at bedtime and cover with sliding scale.  Hemoglobin A1c.  5.  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  Patient seen by Dr. Oneida Alar as an outpatient.  Continue to  monitor.  6.  Morbid obesity: Dietary counseling prior to discharge may be of benefit.  7.  Obstructive sleep apnea: This is listed as a diagnosis in the patient's history although the patient says she does not have snoring or sleep apnea.  I am wondering if this needs to be looked into as an outpatient.   DVT prophylaxis: Lovenox Code Status: Full code Family Communication: No family present.  Patient retains capacity Disposition Plan: Home in 3 to 4 days when breathing better Consults called: None Admission status: Inpatient   [name] MD FACP [facility] Pager [phone]  If 7PM-7AM, please contact night-coverage [email or web address] Password [facility]  10/20/2018, 12:41 PM

## 2018-10-20 NOTE — Progress Notes (Signed)
PT Cancellation Note  Patient Details Name: [name] [name] MRN: [id number] DOB: March 14, 1951   Cancelled Treatment:    Reason Eval/Treat Not Completed: Other (comment).  Pt was with the pharmacist and will try again at another time.  Has just arrived from the ED.   Ramond Dial 10/20/2018, 4:18 PM   Mee Hives, PT MS Acute Rehab Dept. Number: [place]

## 2018-10-21 ENCOUNTER — Inpatient Hospital Stay (HOSPITAL_COMMUNITY): Payer: Medicare Other

## 2018-10-21 DIAGNOSIS — I370 Nonrheumatic pulmonary valve stenosis: Secondary | ICD-10-CM

## 2018-10-21 DIAGNOSIS — I361 Nonrheumatic tricuspid (valve) insufficiency: Secondary | ICD-10-CM

## 2018-10-21 DIAGNOSIS — I70229 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, unspecified extremity: Secondary | ICD-10-CM

## 2018-10-21 LAB — HIV ANTIBODY (ROUTINE TESTING W REFLEX): HIV Screen 4th Generation wRfx: NONREACTIVE

## 2018-10-21 LAB — BASIC METABOLIC PANEL
Anion gap: 9 (ref 5–15)
BUN: 62 mg/dL — ABNORMAL HIGH (ref 8–23)
CO2: 31 mmol/L (ref 22–32)
Calcium: 10 mg/dL (ref 8.9–10.3)
Chloride: 100 mmol/L (ref 98–111)
Creatinine, Ser: 2.36 mg/dL — ABNORMAL HIGH (ref 0.44–1.00)
GFR calc Af Amer: 24 mL/min — ABNORMAL LOW (ref >60)
GFR calc non Af Amer: 20 mL/min — ABNORMAL LOW (ref >60)
Glucose, Bld: 191 mg/dL — ABNORMAL HIGH (ref 70–99)
Potassium: 4.8 mmol/L (ref 3.5–5.1)
Sodium: 140 mmol/L (ref 135–145)

## 2018-10-21 LAB — GLUCOSE, CAPILLARY
Glucose-Capillary: 171 mg/dL — ABNORMAL HIGH (ref 70–99)
Glucose-Capillary: 199 mg/dL — ABNORMAL HIGH (ref 70–99)
Glucose-Capillary: 181 mg/dL — ABNORMAL HIGH (ref 70–99)
Glucose-Capillary: 208 mg/dL — ABNORMAL HIGH (ref 70–99)

## 2018-10-21 LAB — ECHOCARDIOGRAM COMPLETE
Height: 64 in
Weight: 4377.6 oz

## 2018-10-21 MED ORDER — PNEUMOCOCCAL VAC POLYVALENT 25 MCG/0.5ML IJ INJ
0.5000 mL | INJECTION | INTRAMUSCULAR | Status: DC
  Filled 2018-10-21: qty 0.5

## 2018-10-21 MED ORDER — INSULIN ASPART PROT & ASPART (70-30 MIX) 100 UNIT/ML ~~LOC~~ SUSP
30.0000 [IU] | Freq: Two times a day (BID) | SUBCUTANEOUS | Status: DC
  Administered 2018-10-21 – 2018-10-30 (×17): 30 [IU] via SUBCUTANEOUS
  Filled 2018-10-21 (×2): qty 10

## 2018-10-21 MED ORDER — IPRATROPIUM-ALBUTEROL 0.5-2.5 (3) MG/3ML IN SOLN: 3.0000 mL | Freq: Three times a day (TID) | RESPIRATORY_TRACT | Status: DC

## 2018-10-21 MED ORDER — CALCIUM CARBONATE ANTACID 500 MG PO CHEW
1.0000 | CHEWABLE_TABLET | Freq: Three times a day (TID) | ORAL | Status: DC | PRN
  Administered 2018-10-28 – 2018-10-30 (×4): 200 mg via ORAL
  Filled 2018-10-21 (×4): qty 1

## 2018-10-21 MED ORDER — METHYLPREDNISOLONE SODIUM SUCC 40 MG IJ SOLR
40.0000 mg | Freq: Two times a day (BID) | INTRAMUSCULAR | Status: AC
  Administered 2018-10-21 – 2018-10-22 (×4): 40 mg via INTRAVENOUS
  Filled 2018-10-21 (×4): qty 1

## 2018-10-21 MED ORDER — INSULIN ASPART 100 UNIT/ML ~~LOC~~ SOLN: 0.0000 [IU] | Freq: Every day | SUBCUTANEOUS | Status: DC

## 2018-10-21 MED ORDER — POTASSIUM CHLORIDE CRYS ER 20 MEQ PO TBCR: 40.0000 meq | EXTENDED_RELEASE_TABLET | Freq: Every day | ORAL | Status: DC

## 2018-10-21 MED ORDER — IPRATROPIUM-ALBUTEROL 0.5-2.5 (3) MG/3ML IN SOLN
3.0000 mL | Freq: Three times a day (TID) | RESPIRATORY_TRACT | Status: DC
  Administered 2018-10-21 – 2018-10-24 (×10): 3 mL via RESPIRATORY_TRACT
  Filled 2018-10-21 (×10): qty 3

## 2018-10-21 MED ORDER — INSULIN ASPART 100 UNIT/ML ~~LOC~~ SOLN
0.0000 [IU] | Freq: Three times a day (TID) | SUBCUTANEOUS | Status: DC
  Administered 2018-10-21: 2 [IU] via SUBCUTANEOUS

## 2018-10-21 NOTE — Progress Notes (Signed)
PROGRESS NOTE  [name] [name]  JEH:[id number] DOB: 23-Dec-1950 DOA: 10/20/2018 PCP: [name], MD  Outpatient Specialists: Cardiology, Dr. Gwenlyn Found Brief Narrative: [name] [name] is a 68 y.o. female with a history of morbid obesity, OSA, HTN, HLD, T2DM, stage III CKD who presented to the ED at the insistence of her husband for shortness of breath for several weeks limiting her already limited mobility. She had orthopnea, leg swelling, and PND and stopped exerting herself due to dyspnea, and has also had increasing cough. Evaluation in ED included elevated BNP, troponin 0.10, creatinine near baseline at 1.97. ECG is nonischemic. 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 - Wean oxygen as tolerated - Diuresis as below - Breathing treatments as below - OOB as much as possible, PT/OT.   Acute on chronic systolic, diastolic CHF: Echo 3/78 shows EF 45-50%, elevated left sided filling pressures. RV systolic function ok.  - Hold metolazone with AKI. Still has crackles so will plan on continuing lasix 160mg  IVPB BID (on 160mg  PO BID at home) - Strict I/O, daily weights - Will ask for  cardiology consultation on 1/15.  Wheezing/bronchitis, tobacco use:  - Dulera, albuterol prn, start IV steroids.  - Cessation counseling discussed  AKI on stage III CKD: Due to diuresis.  - Monitor BMP in AM - Avoid nephrotoxins  IDT2DM: HbA1c 7.2% - Continue basal-bolus insulin and correct with SSI. Decrease 70/30 mix because she will not be eating nearly as poorly here as at home. - Continue trulicity.  OSA:  - Needs outpatient evaluation  PAD:  - Continue plavix, statin  Morbid obesity: BMI 46. Discussed that this is a primary underlying factor in her future health. - Dietitian consult  Hypothyroidism:  - Continue synthroid  Incidental pulmonary nodule: Interval development of 11 mm nodule seen in left lower lobe. This is concerning for possible neoplasm or malignancy.  - Consider one of the following in 3 months for both low-risk and high-risk individuals: (a) repeat chest CT, (b) follow-up PET-CT, or (c) tissue sampling.  PAD:  - Follow up with Dr. Oneida Alar  DVT prophylaxis: Weight-dosed lovenox Code Status: Full Family Communication: Husband at bedside Disposition Plan: Uncertain.   Consultants:   Cardiology to evaluate 1/15  Procedures:   Echocardiogram 10/21/2018: - Left ventricle: The cavity size was mildly dilated. Wall   thickness was normal. Systolic function was mildly reduced. The   estimated ejection fraction was in the range of 45% to 50%.   Diffuse hypokinesis. Doppler parameters are consistent with both   elevated ventricular end-diastolic filling pressure and elevated   left atrial filling pressure. - Left atrium: The atrium was moderately dilated.  Antimicrobials:  None   Subjective: Breathing is worse than usual, not significantly improved from admission. Hasn't  gotten up yet. No chest pain.  Objective: Vitals:   10/21/18 0802 10/21/18 0854 10/21/18 1151 10/21/18 1421  BP: 125/64 (!) 141/69 122/60   Pulse: 82 96 84   Resp:  19 17     Temp:  98.3 F (36.8 C) 98.4 F (36.9 C)   TempSrc:  Oral Oral   SpO2: 95% 94% 90% 95%  Weight:      Height:        Intake/Output Summary (Last 24 hours) at 10/21/2018 1930 Last data filed at 10/21/2018 1849 Gross per 24 hour  Intake 1323 ml  Output 2700 ml  Net -1377 ml   Filed Weights   10/20/18 0922 10/21/18 0325  Weight: 118.8 kg 124.1 kg   Gen: Obese female in no distress  Pulm: Tachypneic with supplemental oxygen, audible wheezing grossly and confirmed bilaterally posteriorly. Bibasilar crackles also noted.  CV: Regular rate and rhythm. No murmur, rub, or gallop. Uncertain JVD, + pedal edema. GI: Abdomen soft, obese, non-tender, non-distended, with normoactive bowel sounds. No organomegaly or masses felt. Ext: Warm, no deformities Skin: No rashes, lesions or ulcers Neuro: Alert and oriented. No focal neurological deficits. Psych: Judgement and insight appear normal. Mood & affect appropriate.   Data Reviewed: I have personally reviewed following labs and imaging studies  CBC: Recent Labs  Lab 10/20/18 0853 10/20/18 1342  WBC 13.8* 13.6*  NEUTROABS 11.3*  --   HGB 11.2* 11.3*  HCT 37.2 37.1  MCV 85.1 86.9  PLT 356 035   Basic Metabolic Panel: Recent Labs  Lab 10/20/18 0853 10/20/18 1342 10/21/18 0442  NA 141  --  140  K 3.5  --  4.8  CL 101  --  100  CO2 28  --  31  GLUCOSE 154*  --  191*  BUN 55*  --  62*  CREATININE 1.97* 2.07* 2.36*  CALCIUM 10.1  --  10.0   GFR: Estimated Creatinine Clearance: 29.7 mL/min (A) (by C-G formula based on SCr of 2.36 mg/dL (H)). Liver Function Tests: Recent Labs  Lab 10/20/18 0853  AST 33  ALT 20  ALKPHOS 39  BILITOT 0.8  PROT 7.0  ALBUMIN 3.6   No results for input(s): LIPASE, AMYLASE in the last 168 hours. No results for input(s): AMMONIA in the last 168 hours. Coagulation Profile: Recent Labs  Lab 10/20/18 0853  INR 0.96   Cardiac Enzymes: No results for input(s): CKTOTAL, CKMB, CKMBINDEX,  TROPONINI in the last 168 hours. BNP (last 3 results) No results for input(s): PROBNP in the last 8760 hours. HbA1C: Recent Labs    10/20/18 1607  HGBA1C 7.2*   CBG: Recent Labs  Lab 10/20/18 1623 10/20/18 2109 10/21/18 0850 10/21/18 1152 10/21/18 1715  GLUCAP 208* 235* 171* 199* 181*   Lipid Profile: No results for input(s): CHOL, HDL, LDLCALC, TRIG, CHOLHDL, LDLDIRECT in the last 72 hours. Thyroid Function Tests: No results for input(s): TSH, T4TOTAL, FREET4, T3FREE, THYROIDAB in the last 72 hours. Anemia Panel: No results for input(s): VITAMINB12, FOLATE, FERRITIN, TIBC, IRON, RETICCTPCT in the last 72 hours. Urine analysis:    Component Value Date/Time   COLORURINE YELLOW 10/20/2018 0853   APPEARANCEUR HAZY (A) 10/20/2018 0853   LABSPEC 1.011 10/20/2018 0853   PHURINE 5.0 10/20/2018 0853   GLUCOSEU NEGATIVE 10/20/2018 0853   HGBUR NEGATIVE 10/20/2018 0853   BILIRUBINUR NEGATIVE 10/20/2018 0853   KETONESUR NEGATIVE 10/20/2018 0853   PROTEINUR 30 (A) 10/20/2018 0853   UROBILINOGEN 0.2 08/04/2014 1409   NITRITE  NEGATIVE 10/20/2018 0853   LEUKOCYTESUR NEGATIVE 10/20/2018 0853   No results found for this or any previous visit (from the past 240 hour(s)).    Radiology Studies: Dg Chest 1 View  Result Date: 10/20/2018 CLINICAL DATA:  Dyspnea EXAM: CHEST  1 VIEW COMPARISON:  04/17/2015 FINDINGS: Cardiac shadow is enlarged but stable. Aortic calcifications are again seen. The lungs are well aerated bilaterally without focal infiltrate or sizable effusion. No acute bony abnormality is seen. IMPRESSION: No acute abnormality noted. Electronically Signed   By: Inez Catalina M.D.   On: 10/20/2018 09:15   Dg Chest 2 View  Result Date: 10/21/2018 CLINICAL DATA:  Congestive heart failure. EXAM: CHEST - 2 VIEW COMPARISON:  Radiograph of October 20, 2018. FINDINGS: Stable cardiomegaly. Atherosclerosis of thoracic aorta is noted. No pneumothorax is noted. No acute pulmonary disease  is noted. Bony thorax is unremarkable. IMPRESSION: Stable cardiomegaly.  No acute cardiopulmonary abnormality seen. Aortic Atherosclerosis (ICD10-I70.0). Electronically Signed   By: Marijo Conception, M.D.   On: 10/21/2018 07:43   Ct Chest Wo Contrast  Result Date: 10/20/2018 CLINICAL DATA:  Shortness of breath, cough. EXAM: CT CHEST WITHOUT CONTRAST TECHNIQUE: Multidetector CT imaging of the chest was performed following the standard protocol without IV contrast. COMPARISON:  CT scan of April 08, 2015. FINDINGS: Cardiovascular: Atherosclerosis of thoracic aorta is noted without aneurysm formation. Mild cardiomegaly is noted. No pericardial effusion is noted. Mild coronary artery calcifications are noted. Mediastinum/Nodes: No enlarged mediastinal or axillary lymph nodes. Thyroid gland, trachea, and esophagus demonstrate no significant findings. Lungs/Pleura: No pneumothorax or pleural effusion is noted. 11 x 10 mm nodule is seen in left lower lobe best seen on image number 89 of series 4. This was not present on prior exam and is concerning for possible neoplasm. 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 Upper Abdomen: No acute abnormality. Musculoskeletal: No chest wall mass or suspicious bone lesions identified. IMPRESSION: Interval development of 11 mm nodule seen in left lower lobe. This is concerning for possible neoplasm or malignancy. Consider one of the following in 3 months for both low-risk and high-risk individuals: (a) repeat chest CT, (b) follow-up PET-CT, or (c) tissue sampling. This recommendation follows the consensus statement: Guidelines for Management of Incidental Pulmonary Nodules Detected on CT Images: From the Fleischner Society 2017; Radiology 2017; 284:228-243. Mild coronary artery calcifications are noted. Aortic Atherosclerosis (ICD10-I70.0). Electronically Signed   By: Marijo Conception, M.D.   On: 10/20/2018 13:18   Vas [name] Lower Extremity Venous (dvt) (only Mc & Wl  7a-7p)  Result Date: 10/20/2018  Lower Venous Study Indications: SOB, and Edema. Other Indications: Morbid obesity. Risk Factors: Tobacco abuse, PAD. Limitations: Body habitus and patient would not allow compression. Comparison Study: No prior study on file Performing Technologist: Sharion Dove RVS  Examination Guidelines: A complete evaluation includes B-mode imaging, spectral Doppler, color Doppler, and power Doppler as needed of all accessible portions of each vessel. Bilateral testing is considered an integral part of a complete examination. Limited examinations for reoccurring indications may be performed as noted.  Right Venous Findings: +---------+---------------+---------+-----------+----------+--------------+          CompressibilityPhasicitySpontaneityPropertiesSummary        +---------+---------------+---------+-----------+----------+--------------+ CFV                     Yes      Yes                                 +---------+---------------+---------+-----------+----------+--------------+  FV Prox  Full           Yes      Yes                                 +---------+---------------+---------+-----------+----------+--------------+ FV Mid                  Yes      Yes                  visualized     +---------+---------------+---------+-----------+----------+--------------+ FV Distal               Yes      Yes                  visualized     +---------+---------------+---------+-----------+----------+--------------+ PFV                                                   Not visualized +---------+---------------+---------+-----------+----------+--------------+ POP      Full           Yes      Yes                                 +---------+---------------+---------+-----------+----------+--------------+ PTV                                                   Not visualized +---------+---------------+---------+-----------+----------+--------------+  PERO                                                  Not visualized +---------+---------------+---------+-----------+----------+--------------+  Left Venous Findings: +---------+---------------+---------+-----------+----------+--------------+          CompressibilityPhasicitySpontaneityPropertiesSummary        +---------+---------------+---------+-----------+----------+--------------+ CFV                     Yes      Yes                  visualized     +---------+---------------+---------+-----------+----------+--------------+ FV Prox                 Yes      Yes                  visualized     +---------+---------------+---------+-----------+----------+--------------+ FV Mid                  Yes      Yes                  visualized     +---------+---------------+---------+-----------+----------+--------------+ FV Distal               Yes      Yes                  visualized     +---------+---------------+---------+-----------+----------+--------------+ PFV  Not visualized +---------+---------------+---------+-----------+----------+--------------+ POP      Full           Yes      Yes                                 +---------+---------------+---------+-----------+----------+--------------+ PTV                                                   visualized     +---------+---------------+---------+-----------+----------+--------------+ PERO                                                  Not visualized +---------+---------------+---------+-----------+----------+--------------+    Summary: Right: There is no evidence of deep vein thrombosis in the lower extremity. However, portions of this examination were limited- see technologist comments above. Left: There is no evidence of deep vein thrombosis in the lower extremity. However, portions of this examination were limited- see technologist comments  above.  *See table(s) above for measurements and observations. Electronically signed by Deitra Mayo MD on 10/20/2018 at 5:11:00 PM.    Final     Scheduled Meds: . allopurinol  100 mg Oral Daily  . calcium-vitamin D  1 tablet Oral Q breakfast  . carvedilol  3.125 mg Oral BID WC  . clopidogrel  75 mg Oral Daily  . Dulaglutide  1.5 mg Subcutaneous Q Mon  . enoxaparin (LOVENOX) injection  60 mg Subcutaneous Q24H  . fenofibrate  160 mg Oral Daily  . insulin aspart  0-5 Units Subcutaneous QHS  . insulin aspart  0-9 Units Subcutaneous TID WC  . insulin aspart protamine- aspart  30 Units Subcutaneous BID WC  . ipratropium-albuterol  3 mL Nebulization TID  . levothyroxine  150 mcg Oral QAC breakfast  . magnesium oxide  600 mg Oral Daily  . methylPREDNISolone (SOLU-MEDROL) injection  40 mg Intravenous Q12H  . mometasone-formoterol  2 puff Inhalation BID  . multivitamin with minerals  1 tablet Oral Daily  . [START ON 10/22/2018] pneumococcal 23 valent vaccine  0.5 mL Intramuscular Tomorrow-1000  . [START ON 10/22/2018] potassium chloride SA  40 mEq Oral Daily  . simvastatin  20 mg Oral QHS  . sodium chloride flush  3 mL Intravenous Q12H  . tiZANidine  4 mg Oral BID   Continuous Infusions: . sodium chloride    . furosemide 160 mg (10/21/18 1316)     LOS: 1 day   Time spent: 25 minutes.  Patrecia Pour, MD [facility] [email or web address] Password [email or web address] 10/21/2018, 7:30 PM

## 2018-10-21 NOTE — Care Management Note (Signed)
Case Management Note  Patient Details  Name: [name] [name] MRN: [id number] Date of Birth: 10-18-1950  Subjective/Objective:  CHF                 Action/Plan: Patient lives at home alone; she has a spouse that stays with his brother but he checks on her also; PCP: [name], MD; has private insurance with [employer]; pharmacy of choice is [facility] on [address]; she was getting her medication through [facility] but stated that they would send her too much (90 day supply); CM talked to the patient about the benefit of mail order pharmacy; patient stated " I just don't like looking at all that medication." she has a nurses aide throught a [facility] 6 days a week for 3.5 hrs; patient cooks but continues to use salt; Nutritional consult placed. Patient continues to smoke cigarettes - 1 pack in 3 days and is trying to stop; DME - cane and walker at home; [facility] offered to patient/ [facility], she chose [facility]; Dan with [facility] called for arrangements; CM will continue to follow for progression of care.  Expected Discharge Date:    possibly 10/25/2018              Expected Discharge Plan:  [place]  Discharge planning Services  CM Consult  Choice offered to:  Patient  HH Arranged:  RN, Disease Management, PT [place] Agency:  [facility]  Status of Service:  In process, will continue to follow  Sherrilyn Rist [phone] 10/21/2018, 11:24 AM

## 2018-10-21 NOTE — Progress Notes (Signed)
Patient transported to Radiology department in the bed.

## 2018-10-21 NOTE — Progress Notes (Signed)
PRN zofran given for nausea after returning from [facility]

## 2018-10-21 NOTE — Progress Notes (Signed)
PT Cancellation Note  Patient Details Name: EMERY BINZ MRN: [id number] DOB: 02-Jun-1951   Cancelled Treatment:    Reason Eval/Treat Not Completed: Medical issues which prohibited therapy- RN advises that patient is too short of breath to participate in therapy today. Will return tomorrow.    Harley Fitzwater 10/21/2018, 12:18 PM

## 2018-10-21 NOTE — Progress Notes (Signed)
  Echocardiogram 2D Echocardiogram has been performed.  [name] [name] 10/21/2018, 4:47 PM

## 2018-10-22 DIAGNOSIS — N183 Chronic kidney disease, stage 3 (moderate): Secondary | ICD-10-CM

## 2018-10-22 DIAGNOSIS — E875 Hyperkalemia: Secondary | ICD-10-CM

## 2018-10-22 DIAGNOSIS — I130 Hypertensive heart and chronic kidney disease with heart failure and stage 1 through stage 4 chronic kidney disease, or unspecified chronic kidney disease: Principal | ICD-10-CM

## 2018-10-22 DIAGNOSIS — I5023 Acute on chronic systolic (congestive) heart failure: Secondary | ICD-10-CM

## 2018-10-22 LAB — GLUCOSE, CAPILLARY
Glucose-Capillary: 148 mg/dL — ABNORMAL HIGH (ref 70–99)
Glucose-Capillary: 181 mg/dL — ABNORMAL HIGH (ref 70–99)
Glucose-Capillary: 229 mg/dL — ABNORMAL HIGH (ref 70–99)
Glucose-Capillary: 234 mg/dL — ABNORMAL HIGH (ref 70–99)
Glucose-Capillary: 248 mg/dL — ABNORMAL HIGH (ref 70–99)

## 2018-10-22 LAB — BASIC METABOLIC PANEL
Anion gap: 10 (ref 5–15)
BUN: 73 mg/dL — ABNORMAL HIGH (ref 8–23)
CO2: 34 mmol/L — ABNORMAL HIGH (ref 22–32)
Calcium: 10.5 mg/dL — ABNORMAL HIGH (ref 8.9–10.3)
Chloride: 95 mmol/L — ABNORMAL LOW (ref 98–111)
Creatinine, Ser: 2.99 mg/dL — ABNORMAL HIGH (ref 0.44–1.00)
GFR calc Af Amer: 18 mL/min — ABNORMAL LOW (ref >60)
GFR calc non Af Amer: 15 mL/min — ABNORMAL LOW (ref >60)
Glucose, Bld: 202 mg/dL — ABNORMAL HIGH (ref 70–99)
Potassium: 5.7 mmol/L — ABNORMAL HIGH (ref 3.5–5.1)
Sodium: 139 mmol/L (ref 135–145)

## 2018-10-22 MED ORDER — HEPARIN SODIUM (PORCINE) 5000 UNIT/ML IJ SOLN
5000.0000 [IU] | Freq: Three times a day (TID) | INTRAMUSCULAR | Status: DC
  Administered 2018-10-23 – 2018-10-30 (×24): 5000 [IU] via SUBCUTANEOUS
  Filled 2018-10-22 (×25): qty 1

## 2018-10-22 MED ORDER — PREDNISONE 20 MG PO TABS
30.0000 mg | ORAL_TABLET | Freq: Every day | ORAL | Status: DC
  Administered 2018-10-23: 30 mg via ORAL
  Filled 2018-10-22: qty 1

## 2018-10-22 MED ORDER — CALCIUM CARBONATE-VITAMIN D 500-200 MG-UNIT PO TABS
1.0000 | ORAL_TABLET | Freq: Every day | ORAL | Status: DC
  Administered 2018-10-22 – 2018-10-31 (×10): 1 via ORAL
  Filled 2018-10-22 (×10): qty 1

## 2018-10-22 MED ORDER — SODIUM POLYSTYRENE SULFONATE 15 GM/60ML PO SUSP
60.0000 g | Freq: Once | ORAL | Status: AC
  Administered 2018-10-22: 60 g via ORAL
  Filled 2018-10-22: qty 240

## 2018-10-22 MED ORDER — INSULIN ASPART 100 UNIT/ML ~~LOC~~ SOLN
0.0000 [IU] | Freq: Three times a day (TID) | SUBCUTANEOUS | Status: DC
  Administered 2018-10-22 (×2): 3 [IU] via SUBCUTANEOUS
  Administered 2018-10-22: 2 [IU] via SUBCUTANEOUS
  Administered 2018-10-23: 5 [IU] via SUBCUTANEOUS
  Administered 2018-10-23: 3 [IU] via SUBCUTANEOUS
  Administered 2018-10-23: 1 [IU] via SUBCUTANEOUS
  Administered 2018-10-24: 2 [IU] via SUBCUTANEOUS
  Administered 2018-10-24: 5 [IU] via SUBCUTANEOUS
  Administered 2018-10-25: 2 [IU] via SUBCUTANEOUS
  Administered 2018-10-26 (×2): 1 [IU] via SUBCUTANEOUS
  Administered 2018-10-26 – 2018-10-29 (×5): 2 [IU] via SUBCUTANEOUS
  Administered 2018-10-30: 1 [IU] via SUBCUTANEOUS

## 2018-10-22 NOTE — Plan of Care (Signed)
Nutrition Education Note  RD consulted for nutrition education regarding CHF and diabetes.   Lab Results  Component Value Date   HGBA1C 7.2 (H) 10/20/2018  PTA DM medications are 1.5 mg dulagultide every Monday and 70 units insulin lispro-protamine lispro BID   Labs reviewed: CBGS: 148-208 (inpatient orders for glycemic control are 1.5 mg dulaglutide every Monday, 0-5 units insulin aspart q HS, and 0-9 units insulin aspart TID).    Spoke with pt at bedside, who reports frustration about not being able to consume foods that she likes. She shares that she typivally consumes 2 meals per day (Breakfast: 2 slices of bacon, grits, eggs, and toast (which she usually consumes half of and consumes the rest in the afternoon for lunch) and Dinner: frozen meal OR chicken with pasta salad or green salad). Pt also endorses consuming fast food approximately 2-3 times per week (popcorn chicken and steak fries from [facility] or [employer]). Pt reports she salts food at the table and also when cooking. She also complains about lack of flavor of foods; she is amenable to using Mrs Deliah Boston, however "I'll need a gallon of that on my food for it to have any flavor".   Spent most of the visit discussing with pt ways to reduce sodium intake in her diet. Pt shares that an RD told her not to salt when cooking, but use it only at the table after she has flavored it. Pt seemed somewhat receptive to this concept and encouraged her to continue to decrease added salt in her diet.   RD provided "Heart Healthy, Consistent Carbohydrate Nutrition Therapy" handout from the Academy of Nutrition and Dietetics. Reviewed patient's dietary recall. Provided examples on ways to decrease sodium intake in diet. Discouraged intake of processed foods and use of salt shaker. Encouraged fresh fruits and vegetables as well as whole grain sources of carbohydrates to maximize fiber intake.   RD discussed why it is important for patient to adhere to diet  recommendations, and emphasized the role of fluids, foods to avoid, and importance of weighing self daily.   Discussed different food groups and their effects on blood sugar, emphasizing carbohydrate-containing foods. Provided list of carbohydrates and recommended serving sizes of common foods.  Discussed importance of controlled and consistent carbohydrate intake throughout the day. Provided examples of ways to balance meals/snacks and encouraged intake of high-fiber, whole grain complex carbohydrates. Teach back method used.  Expect fair to poor compliance.  Body mass index is 46.74 kg/m. Pt meets criteria for extreme obesity, class III based on current BMI.  Current diet order is Heart Healthy/ Carb Modified with 1.8 L fluid restriction, patient is consuming approximately 100% of meals at this time. Labs and medications reviewed. No further nutrition interventions warranted at this time. RD contact information provided. If additional nutrition issues arise, please re-consult RD.   Meiko Ives A. Jimmye Norman, RD, LDN, CDE Pager: [phone] After hours Pager: [phone]

## 2018-10-22 NOTE — Consult Note (Signed)
Cardiology Consultation:   Patient ID: [name] [name] MRN: [id number]; DOB: Sep 02, 1951  Admit date: 10/20/2018 Date of Consult: 10/22/2018  Primary Care Provider: [name], MD Primary Cardiologist: Dr. Quay Burow Primary Electrophysiologist:  None    Patient Profile:   [name] [name] is a 68 y.o. female with a hx of ischemic cardiomyopathy who is being seen today for the evaluation of combined systolic and diastolic heart failure at the request of Dr Waldron Labs.  History of Present Illness:   [name] [name] is a 68 year old morbidly overweight married African-American female mother of 1 son, grandmother of 2 grandchildren who I last saw in the office 07/24/2017.  She has a history of treated hypertension, diabetes and hyperlipidemia.  She does continue to smoke a third of a pack a day.  I performed cardiac catheterization on her 9/15 revealing normal coronary arteries with mild LV dysfunction in the 45 to 50% range.  She is also had peripheral angiography and intervention by Dr. Oneida Alar 3 years ago.  She admits to dietary indiscretion over the holidays.  She was on high-dose oral diuretics as an outpatient.  She noticed progressive increasing dyspnea lower extremity edema and was admitted with combined systolic and diastolic heart failure.  Past Medical History:  Diagnosis Date  . Anemia   . Arthritis   . Asthma   . Bronchitis   . Chronic kidney disease   . Chronic kidney disease   . Chronic pain syndrome   . Cramping of feet   . Diabetes mellitus   . GERD (gastroesophageal reflux disease)   . Gout   . Headache   . History of hip replacement, total   . History of transfusion   . Hx MRSA infection    abscess left groin  . Hx of cardiac cath 06/2014  . Hyperlipidemia   . Hypertension   . Hypothyroid   . 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 Morbidly obese ([place])   . Peripheral vascular disease ([place])   . Positive cardiac stress test 12/2013   anterior and lateral  ischemia on Myoview  . Sleep apnea    UNABLE TO TOLERATE C PAP  . Stress incontinence   . Thyroid disease   . TIA (transient ischemic attack)     X2 NO RESIDUAL PROBLEMS    Past Surgical History:  Procedure Laterality Date  . ABDOMINAL HYSTERECTOMY    . COLON SURGERY  1995   DUE TO POLYP  . FOREARM FRACTURE SURGERY     Left arm  . HERNIA REPAIR     w/ mesh  . INCISION AND DRAINAGE ABSCESS Left 02/04/2013   Procedure: INCISION AND DRAINAGE LEFT BUTTOCK ABSCESS; INCISION AND DRAINAGE LEFT BREAST ABSCESS;  Surgeon: Harl Bowie, MD;  Location: [facility];  Service: General;  Laterality: Left;  . INCISION AND DRAINAGE ABSCESS N/A 02/12/2013   Procedure: INCISION AND DEBRIDEMENT BUTTOCK WOUND ;  Surgeon: [name]. Georgette Dover, MD;  Location: [place];  Service: General;  Laterality: N/A;  . INCISION AND DRAINAGE ABSCESS N/A 02/14/2013   Procedure: INCISION AND DRAINAGE/DRESSING CHANGE;  Surgeon: Harl Bowie, MD;  Location: [place];  Service: General;  Laterality: N/A;  . INCISION AND DRAINAGE ABSCESS N/A 03/21/2015   Procedure: INCISION AND DRAINAGE PUBIC ABSCESS;  Surgeon: Excell Seltzer, MD;  Location: [facility];  Service: General;  Laterality: N/A;  . INCISION AND DRAINAGE PERIRECTAL ABSCESS Left 02/10/2013   Procedure: IRRIGATION AND DEBRIDEMENT OF BUTTOCK/PERINEAL ABSCESS;  Surgeon: [name]. Tsuei,  MD;  Location: [place];  Service: General;  Laterality: Left;  . INCISION AND DRAINAGE PERIRECTAL ABSCESS N/A 02/16/2013   Procedure: IRRIGATION AND DEBRIDEMENT PERINEAL ABSCESS;  Surgeon: Zenovia Jarred, MD;  Location: [place];  Service: General;  Laterality: N/A;  . IRRIGATION AND DEBRIDEMENT ABSCESS N/A 02/06/2013   Procedure: IRRIGATION AND DEBRIDEMENT BUTTOCK ABSCESS AND DRESSING CHANGE;  Surgeon: Harl Bowie, MD;  Location: [place];  Service: General;  Laterality: N/A;  . IRRIGATION AND DEBRIDEMENT ABSCESS Left 02/08/2013   Procedure: IRRIGATION AND DEBRIDEMENT ABSCESS/DRESSING CHANGE;   Surgeon: Gwenyth Ober, MD;  Location: [place];  Service: General;  Laterality: Left;  . JOINT REPLACEMENT  2010 / 2012   . LAPAROSCOPIC CHOLECYSTECTOMY    . LEFT HEART CATHETERIZATION WITH CORONARY ANGIOGRAM N/A 07/02/2014   Procedure: LEFT HEART CATHETERIZATION WITH CORONARY ANGIOGRAM;  Surgeon: Lorretta Harp, MD;  Location: [facility];  Service: Cardiovascular;  Laterality: N/A;  . LOWER EXTREMITY ANGIOGRAM Right 04/20/2016   Procedure: Lower Extremity Angiogram;  Surgeon: Elam Dutch, MD;  Location: [place] [facility];  Service: Cardiovascular;  Laterality: Right;  . PERIPHERAL VASCULAR CATHETERIZATION N/A 04/20/2016   Procedure: Abdominal Aortogram;  Surgeon: Elam Dutch, MD;  Location: [place] [facility];  Service: Cardiovascular;  Laterality: N/A;  . PERIPHERAL VASCULAR CATHETERIZATION Right 04/20/2016   Procedure: Peripheral Vascular Intervention;  Surgeon: Elam Dutch, MD;  Location: [place];  Service: Cardiovascular;  Laterality: Right;  popiteal  . THYROIDECTOMY    . TOTAL HIP REVISION Right 08/11/2014   Procedure: RIGHT ACETABULAR REVISION;  Surgeon: Gearlean Alf, MD;  Location: [facility];  Service: Orthopedics;  Laterality: Right;     Home Medications:  Prior to Admission medications   Medication Sig Start Date End Date Taking? Authorizing Provider  albuterol (PROVENTIL) (2.5 MG/3ML) 0.083% nebulizer solution Take 2.5 mg by nebulization every 6 (six) hours as needed for wheezing or shortness of breath.   Yes [provider]  allopurinol (ZYLOPRIM) 100 MG tablet Take 100 mg by mouth every morning.    Yes [provider]  B Complex Vitamins (VITAMIN B COMPLEX PO) Take 1 tablet by mouth 2 (two) times daily.    Yes [provider]  budesonide-formoterol (SYMBICORT) 160-4.5 MCG/ACT inhaler Inhale 2 puffs into the lungs 2 (two) times daily as needed (for SOB).    Yes [provider]  Calcium Carbonate-Vit D-Min (CALCIUM 1200 PO)  Take 1,200 mg by mouth daily.    Yes [provider]  cetirizine (ZYRTEC) 10 MG tablet Take 10 mg by mouth daily as needed for allergies.  03/14/15  Yes [provider]  clopidogrel (PLAVIX) 75 MG tablet TAKE 1 TABLET BY MOUTH EVERY DAY Patient taking differently: Take 75 mg by mouth daily.  06/18/17  Yes Waynetta Sandy, MD  Dulaglutide 1.5 MG/0.5ML SOPN Inject 1.5 mg into the skin every Monday.    Yes [provider]  fenofibrate (TRICOR) 145 MG tablet TAKE 1 TABLET (145 MG TOTAL) BY MOUTH DAILY. Patient taking differently: Take 145 mg by mouth daily.  05/11/15  Yes Lorretta Harp, MD  fluticasone (FLONASE) 50 MCG/ACT nasal spray Place 2 sprays into the nose as needed for allergies.    Yes [provider]  furosemide (LASIX) 80 MG tablet Take 160 mg by mouth 2 (two) times daily.   Yes [provider]  hydrOXYzine (ATARAX/VISTARIL) 25 MG tablet Take 25 mg by mouth 2 (two) times  daily as needed for itching.   Yes [provider]  insulin lispro protamine-lispro (HUMALOG 75/25 MIX) (75-25) 100 UNIT/ML SUSP injection Inject 40 Units into the skin 2 (two) times daily with a meal.    Yes [provider]  levothyroxine (SYNTHROID, LEVOTHROID) 150 MCG tablet Take 150 mcg by mouth daily before breakfast.   Yes [provider]  MAGNESIUM PO Take 600 mg by mouth daily.   Yes [provider]  metolazone (ZAROXOLYN) 2.5 MG tablet Take 2.5 mg by mouth 2 (two) times daily. Take with laix 08/15/17  Yes [provider]  mirabegron ER (MYRBETRIQ) 50 MG TB24 tablet Take 50 mg by mouth daily.   Yes [provider]  Multiple Vitamin (MULTIVITAMIN WITH MINERALS) TABS tablet Take 1 tablet by mouth daily.   Yes [provider]  naftifine (NAFTIN) 1 % cream APPLY TO THE AFFECTED AREA ON SKIN EVERY DAY Patient taking differently: Apply 1 application topically daily as needed (skin irritation).  05/23/15  Yes  Hyatt, Max T, DPM  oxyCODONE-acetaminophen (PERCOCET) 10-325 MG tablet Take 1 tablet by mouth every 4 (four) hours as needed for pain.   Yes [provider]  polyvinyl alcohol (LIQUIFILM TEARS) 1.4 % ophthalmic solution Place 2 drops into both eyes daily as needed for dry eyes.   Yes [provider]  potassium chloride SA (K-DUR,KLOR-CON) 20 MEQ tablet Take 40 mEq by mouth 2 (two) times daily.    Yes [provider]  simvastatin (ZOCOR) 20 MG tablet Take 20 mg by mouth at bedtime. 04/08/17  Yes [provider]  tiZANidine (ZANAFLEX) 4 MG capsule Take 4 mg by mouth 2 (two) times daily.    Yes [provider]    Inpatient Medications: Scheduled Meds: . allopurinol  100 mg Oral Daily  . calcium-vitamin D  1 tablet Oral Q breakfast  . carvedilol  3.125 mg Oral BID WC  . clopidogrel  75 mg Oral Daily  . Dulaglutide  1.5 mg Subcutaneous Q Mon  . fenofibrate  160 mg Oral Daily  . [START ON 10/23/2018] heparin injection (subcutaneous)  5,000 Units Subcutaneous Q8H  . insulin aspart  0-5 Units Subcutaneous QHS  . insulin aspart  0-9 Units Subcutaneous TID WC  . insulin aspart protamine- aspart  30 Units Subcutaneous BID WC  . ipratropium-albuterol  3 mL Nebulization TID  . levothyroxine  150 mcg Oral QAC breakfast  . magnesium oxide  600 mg Oral Daily  . methylPREDNISolone (SOLU-MEDROL) injection  40 mg Intravenous Q12H  . mometasone-formoterol  2 puff Inhalation BID  . multivitamin with minerals  1 tablet Oral Daily  . pneumococcal 23 valent vaccine  0.5 mL Intramuscular Tomorrow-1000  . [START ON 10/23/2018] predniSONE  30 mg Oral Q breakfast  . simvastatin  20 mg Oral QHS  . sodium chloride flush  3 mL Intravenous Q12H  . sodium polystyrene  60 g Oral Once  . tiZANidine  4 mg Oral BID   Continuous Infusions: . sodium chloride Stopped (10/22/18 0200)   PRN Meds: sodium chloride, acetaminophen, albuterol, calcium carbonate, fluticasone,  hydrOXYzine, ondansetron (ZOFRAN) IV, oxyCODONE-acetaminophen **AND** oxyCODONE, polyvinyl alcohol, sodium chloride flush  Allergies:    Allergies  Allergen Reactions  . Nebivolol Swelling  . Ace Inhibitors Swelling and Other (See Comments)    Tongue swell  . Bystolic [Nebivolol Hcl] Swelling and Other (See Comments)    Chest pain   . Morphine And Related Itching    Social History:  Social History   Socioeconomic History  . Marital status: Legally Separated    Spouse name: Not on file  . Number of children: 1  . Years of education: college  . Highest education level: Not on file  Occupational History    Comment: Disabled  Social Needs  . Financial resource strain: Not on file  . Food insecurity:    Worry: Not on file    Inability: Not on file  . Transportation needs:    Medical: Not on file    Non-medical: Not on file  Tobacco Use  . Smoking status: Current Every Day Smoker    Packs/day: 0.25    Years: 40.00    Pack years: 10.00    Types: Cigarettes  . Smokeless tobacco: Never Used  Substance and Sexual Activity  . Alcohol use: Yes    Alcohol/week: 1.0 standard drinks    Types: 1 Standard drinks or equivalent per week    Comment: OCC  . Drug use: No    Comment: Quit four years ago.  Marland Kitchen Sexual activity: Not on file  Lifestyle  . Physical activity:    Days per week: Not on file    Minutes per session: Not on file  . Stress: Not on file  Relationships  . Social connections:    Talks on phone: Not on file    Gets together: Not on file    Attends religious service: Not on file    Active member of club or organization: Not on file    Attends meetings of clubs or organizations: Not on file    Relationship status: Not on file  . Intimate partner violence:    Fear of current or ex partner: Not on file    Emotionally abused: Not on file    Physically abused: Not on file    Forced sexual activity: Not on file  Other Topics Concern  . Not on file  Social History  Narrative   Patient is separated  and lives at home with her friend. Patient is disabled.   Education one year of college   Right handed   Caffeine two cups daily.       Family History:    Family History  Problem Relation Age of Onset  . Diabetes Brother   . Cardiomyopathy Mother   . Heart disease Mother   . Cancer Father      ROS:  Please see the history of present illness.   All other ROS reviewed and negative.     Physical Exam/Data:   Vitals:   10/22/18 0043 10/22/18 0536 10/22/18 0813 10/22/18 1209  BP: 136/82 126/63  (!) 112/57  Pulse: 99 92  77  Resp: 13 20  18   Temp: 98.5 F (36.9 C) 97.9 F (36.6 C)  98.7 F (37.1 C)  TempSrc: Oral Oral  Oral  SpO2: 98% 95% 95% 96%  Weight:  123.5 kg    Height:        Intake/Output Summary (Last 24 hours) at 10/22/2018 1336 Last data filed at 10/22/2018 0958 Gross per 24 hour  Intake 653.77 ml  Output 2250 ml  Net -1596.23 ml   Last 3 Weights 10/22/2018 10/21/2018 10/20/2018  Weight (lbs) 272 lb 4.8 oz 273 lb 9.6 oz 261 lb 14.5 oz  Weight (kg) 123.514 kg 124.104 kg 118.8 kg     Body mass index is 46.74 kg/m.  General:  Well nourished, well developed, in no acute distress HEENT: normal Lymph: no adenopathy Neck:  no JVD Endocrine:  No thryomegaly Vascular: No carotid bruits; FA pulses 2+ bilaterally without bruits  Cardiac:  normal S1, S2; RRR; no murmur  Lungs:  clear to auscultation bilaterally, no wheezing, rhonchi or rales  Abd: soft, nontender, no hepatomegaly  Ext: 2-3+ pitting edema bilaterally Musculoskeletal:  No deformities, BUE and BLE strength normal and equal Skin: warm and dry  Neuro:  CNs 2-12 intact, no focal abnormalities noted Psych:  Normal affect   EKG:  The EKG was personally reviewed and demonstrates: Sinus rhythm at 96 with left axis deviation and nonspecific IVCD Telemetry:  Telemetry was personally reviewed and demonstrates: Minus rhythm with runs of nonsustained ventricular  tachycardia  Relevant CV Studies:   2D echocardiogram (10/21/2018)  Study Conclusions  - Left ventricle: The cavity size was mildly dilated. Wall   thickness was normal. Systolic function was mildly reduced. The   estimated ejection fraction was in the range of 45% to 50%.   Diffuse hypokinesis. Doppler parameters are consistent with both   elevated ventricular end-diastolic filling pressure and elevated   left atrial filling pressure. - Left atrium: The atrium was moderately dilated.  Laboratory Data:  Chemistry Recent Labs  Lab 10/20/18 0853 10/20/18 1342 10/21/18 0442 10/22/18 0453  NA 141  --  140 139  K 3.5  --  4.8 5.7*  CL 101  --  100 95*  CO2 28  --  31 34*  GLUCOSE 154*  --  191* 202*  BUN 55*  --  62* 73*  CREATININE 1.97* 2.07* 2.36* 2.99*  CALCIUM 10.1  --  10.0 10.5*  GFRNONAA 25* 24* 20* 15*  GFRAA 30* 28* 24* 18*  ANIONGAP 12  --  9 10    Recent Labs  Lab 10/20/18 0853  PROT 7.0  ALBUMIN 3.6  AST 33  ALT 20  ALKPHOS 39  BILITOT 0.8   Hematology Recent Labs  Lab 10/20/18 0853 10/20/18 1342  WBC 13.8* 13.6*  RBC 4.37 4.27  HGB 11.2* 11.3*  HCT 37.2 37.1  MCV 85.1 86.9  MCH 25.6* 26.5  MCHC 30.1 30.5  RDW 16.5* 16.5*  PLT 356 365   Cardiac EnzymesNo results for input(s): TROPONINI in the last 168 hours.  Recent Labs  Lab 10/20/18 0904  TROPIPOC 0.10*    BNP Recent Labs  Lab 10/20/18 0853  BNP 500.3*    DDimer No results for input(s): DDIMER in the last 168 hours.  Radiology/Studies:  Dg Chest 1 View  Result Date: 10/20/2018 CLINICAL DATA:  Dyspnea EXAM: CHEST  1 VIEW COMPARISON:  04/17/2015 FINDINGS: Cardiac shadow is enlarged but stable. Aortic calcifications are again seen. The lungs are well aerated bilaterally without focal infiltrate or sizable effusion. No acute bony abnormality is seen. IMPRESSION: No acute abnormality noted. Electronically Signed   By: Inez Catalina M.D.   On: 10/20/2018 09:15   Dg Chest 2  View  Result Date: 10/21/2018 CLINICAL DATA:  Congestive heart failure. EXAM: CHEST - 2 VIEW COMPARISON:  Radiograph of October 20, 2018. FINDINGS: Stable cardiomegaly. Atherosclerosis of thoracic aorta is noted. No pneumothorax is noted. No acute pulmonary disease is noted. Bony thorax is unremarkable. IMPRESSION: Stable cardiomegaly.  No acute cardiopulmonary abnormality seen. Aortic Atherosclerosis (ICD10-I70.0). Electronically Signed   By: Marijo Conception, M.D.   On: 10/21/2018 07:43   Ct Chest Wo Contrast  Result Date: 10/20/2018 CLINICAL DATA:  Shortness of breath, cough. EXAM: CT CHEST WITHOUT CONTRAST TECHNIQUE: Multidetector CT imaging of the chest  was performed following the standard protocol without IV contrast. COMPARISON:  CT scan of April 08, 2015. FINDINGS: Cardiovascular: Atherosclerosis of thoracic aorta is noted without aneurysm formation. Mild cardiomegaly is noted. No pericardial effusion is noted. Mild coronary artery calcifications are noted. Mediastinum/Nodes: No enlarged mediastinal or axillary lymph nodes. Thyroid gland, trachea, and esophagus demonstrate no significant findings. Lungs/Pleura: No pneumothorax or pleural effusion is noted. 11 x 10 mm nodule is seen in left lower lobe best seen on image number 89 of series 4. This was not present on prior exam and is concerning for possible neoplasm. 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 Upper Abdomen: No acute abnormality. Musculoskeletal: No chest wall mass or suspicious bone lesions identified. IMPRESSION: Interval development of 11 mm nodule seen in left lower lobe. This is concerning for possible neoplasm or malignancy. Consider one of the following in 3 months for both low-risk and high-risk individuals: (a) repeat chest CT, (b) follow-up PET-CT, or (c) tissue sampling. This recommendation follows the consensus statement: Guidelines for Management of Incidental Pulmonary Nodules Detected on CT Images: From the  Fleischner Society 2017; Radiology 2017; [id number]:228-243. Mild coronary artery calcifications are noted. Aortic Atherosclerosis (ICD10-I70.0). Electronically Signed   By: Marijo Conception, M.D.   On: 10/20/2018 13:18   Vas [name] Lower Extremity Venous (dvt) (only Mc & Wl 7a-7p)  Result Date: 10/20/2018  Lower Venous Study Indications: SOB, and Edema. Other Indications: Morbid obesity. Risk Factors: Tobacco abuse, PAD. Limitations: Body habitus and patient would not allow compression. Comparison Study: No prior study on file Performing Technologist: Sharion Dove RVS  Examination Guidelines: A complete evaluation includes B-mode imaging, spectral Doppler, color Doppler, and power Doppler as needed of all accessible portions of each vessel. Bilateral testing is considered an integral part of a complete examination. Limited examinations for reoccurring indications may be performed as noted.  Right Venous Findings: +---------+---------------+---------+-----------+----------+--------------+          CompressibilityPhasicitySpontaneityPropertiesSummary        +---------+---------------+---------+-----------+----------+--------------+ CFV                     Yes      Yes                                 +---------+---------------+---------+-----------+----------+--------------+ FV Prox  Full           Yes      Yes                                 +---------+---------------+---------+-----------+----------+--------------+ FV Mid                  Yes      Yes                  visualized     +---------+---------------+---------+-----------+----------+--------------+ FV Distal               Yes      Yes                  visualized     +---------+---------------+---------+-----------+----------+--------------+ PFV  Not visualized +---------+---------------+---------+-----------+----------+--------------+ POP      Full           Yes       Yes                                 +---------+---------------+---------+-----------+----------+--------------+ PTV                                                   Not visualized +---------+---------------+---------+-----------+----------+--------------+ PERO                                                  Not visualized +---------+---------------+---------+-----------+----------+--------------+  Left Venous Findings: +---------+---------------+---------+-----------+----------+--------------+          CompressibilityPhasicitySpontaneityPropertiesSummary        +---------+---------------+---------+-----------+----------+--------------+ CFV                     Yes      Yes                  visualized     +---------+---------------+---------+-----------+----------+--------------+ FV Prox                 Yes      Yes                  visualized     +---------+---------------+---------+-----------+----------+--------------+ FV Mid                  Yes      Yes                  visualized     +---------+---------------+---------+-----------+----------+--------------+ FV Distal               Yes      Yes                  visualized     +---------+---------------+---------+-----------+----------+--------------+ PFV                                                   Not visualized +---------+---------------+---------+-----------+----------+--------------+ POP      Full           Yes      Yes                                 +---------+---------------+---------+-----------+----------+--------------+ PTV                                                   visualized     +---------+---------------+---------+-----------+----------+--------------+ PERO  Not visualized +---------+---------------+---------+-----------+----------+--------------+    Summary: Right: There is no evidence of deep vein  thrombosis in the lower extremity. However, portions of this examination were limited- see technologist comments above. Left: There is no evidence of deep vein thrombosis in the lower extremity. However, portions of this examination were limited- see technologist comments above.  *See table(s) above for measurements and observations. Electronically signed by Deitra Mayo MD on 10/20/2018 at 5:11:00 PM.    Final     Assessment and Plan:   1. Combined systolic and diastolic heart failure- she was admitted with significant volume overload.  She was on high-dose oral diuretics at home.  She does admit to dietary indiscretion.  She is placed on IV furosemide 160 mg p.o. twice daily with only 1.8 L of output.  Her serum creatinine rose from 2-3 over the last 2 days.  I do not think that the addition of inotropes would be beneficial.  She was on metolazone at home which was discontinued by her nephrologist Dr. Florene Glen.  We will ask for the assistance of the advanced heart failure service for further recommendations with regards to diuresis. 2. Tobacco abuse- continues to smoke a third of a pack a day 3. Hyperlipidemia-on statin therapy 4. Abnormal stress test- history of abnormal stress test in the past with cardiac catheterization for performed by myself 07/02/2014 revealing normal coronary arteries 5. Nonsustained ventricular tachycardia- low-dose carvedilol was added.  Would hold off on diuresis today given rising creatinine.  He still has 2+ pitting edema and is significantly volume overloaded.  Will ask the advanced heart failure service for recommendations regarding additional therapy.     For questions or updates, please contact [place] Please consult [email or web address] for contact info under     Signed, Quay Burow, MD  10/22/2018 1:36 PM

## 2018-10-22 NOTE — Plan of Care (Signed)
  Problem: Activity: Goal: Ability to tolerate increased activity will improve Outcome: Progressing   Problem: Respiratory: Goal: Ability to maintain a clear airway will improve Outcome: Progressing

## 2018-10-22 NOTE — Evaluation (Signed)
Occupational Therapy Evaluation Patient Details Name: [name] [name] MRN: [id number] DOB: 01/12/1951 Today's Date: 10/22/2018    History of Present Illness Pt is a 68 y.o. female admitted 10/20/18 with worsening SOB. Worked up for CHF exacerbation. 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 PMH includes CHF, CKD III, HTN, DM2, obesity, OSA.   Clinical Impression   Pt presents to O2 with deficits in functional activity tolerance and cardiorespiratory support that limits her ability to perform ADLs with PLOF. Pt completed full UB/LB bathing/dressing with min guard during standing. Pt required intermittent redirection to task and frequent rest breaks. Pt's SpO2 levels fluctuated from 87-90% following standing intervals. Recommend HHOT upon d/c and OT will follow acutely.    Follow Up Recommendations  Home health OT    Equipment Recommendations  None recommended by OT    Recommendations for Other Services Speech consult;PT consult     Precautions / Restrictions Precautions Precautions: Fall;Other (comment) Precaution Comments: Currently requiring 2L O2 [place] to maintain SpO2 >90% Restrictions Weight Bearing Restrictions: No      Mobility Bed Mobility               General bed mobility comments: Received sitting EOB  Transfers Overall transfer level: Needs assistance Equipment used: Rolling walker (2 wheeled) Transfers: Sit to/from [facility] Sit to Stand: Supervision Stand pivot transfers: Min guard       General transfer comment: Required cues to lock rollator brakes prior to sitting in hallway; supervision for safety    Balance Overall balance assessment: Needs assistance Sitting-balance support: Feet supported Sitting balance-Leahy Scale: Good     Standing balance support: Bilateral upper extremity supported Standing balance-Leahy Scale: Fair Standing balance comment: Can static stand without UE support; dynamic stability  improved with at least single UE support                           ADL either performed or assessed with clinical judgement   ADL Overall ADL's : Needs assistance/impaired Eating/Feeding: Independent;Sitting   Grooming: Wash/dry face;Wash/dry hands;Supervision/safety;Standing   Upper Body Bathing: Supervision/ safety;Sitting   Lower Body Bathing: Min guard;Sit to/from stand   Upper Body Dressing : Set up;Sitting   Lower Body Dressing: Min guard;Sit to/from stand   Toilet Transfer: Min guard;RW   Toileting- Water quality scientist and Hygiene: Min guard;Sit to/from stand       Functional mobility during ADLs: Min guard;Rolling walker General ADL Comments: Pt completed 2 intervals of functional mobility, one with RW and one without, both min guard      Vision Baseline Vision/History: No visual deficits Patient Visual Report: No change from baseline Vision Assessment?: No apparent visual deficits            Pertinent Vitals/Pain Pain Assessment: No/denies pain     Hand Dominance Right   Extremity/Trunk Assessment Upper Extremity Assessment Upper Extremity Assessment: Overall WFL for tasks assessed   Lower Extremity Assessment Lower Extremity Assessment: Defer to PT evaluation   Cervical / Trunk Assessment Cervical / Trunk Assessment: Kyphotic   Communication Communication Communication: No difficulties   Cognition Arousal/Alertness: Awake/alert Behavior During Therapy: WFL for tasks assessed/performed Overall Cognitive Status: Within Functional Limits for tasks assessed                                 General Comments: WFL for simple tasks. Some decreased attention and  poor insight into deficits, likely baseline cognition   General Comments  Husband present during half of session            [place] expects to be discharged to:: Private residence Living Arrangements: Spouse/significant other Available Help at  Discharge: Family;Personal care attendant;Available PRN/intermittently(Aide comes 6 days/week for 3.5 hours) Type of Home: Apartment Home Access: Stairs to enter Entrance Stairs-Number of Steps: 3 Entrance Stairs-Rails: Right Home Layout: One level     Bathroom Shower/Tub: Teacher, early years/pre: Standard     Home Equipment: Hand held shower head;Shower seat;Cane - single point;Walker - 4 wheels   Additional Comments: Husband works      Prior Functioning/Environment Level of Independence: Needs assistance  Gait / Transfers Assistance Needed: Ambulatory with intermittent use of rollator or SPC. Community amb with rollator ADL's / Homemaking Assistance Needed: Aide comes 3 hrs/day for 6 days/wk to assist with household tasks (food prep, cleaning) and ADLs (bathing, setting out clothes, dressing)   Comments: Not on home O2 at baseline        OT Problem List: Decreased strength;Impaired balance (sitting and/or standing);Decreased knowledge of use of DME or AE;Decreased safety awareness;Decreased knowledge of precautions;Cardiopulmonary status limiting activity;Obesity;Decreased activity tolerance      OT Treatment/Interventions: Self-care/ADL training;Therapeutic exercise;Energy conservation;DME and/or AE instruction;Therapeutic activities;Balance training;Patient/family education    OT Goals(Current goals can be found in the care plan section) Acute Rehab OT Goals Patient Stated Goal: Return home without having to wear oxygen OT Goal Formulation: With patient/family Time For Goal Achievement: 11/01/18 Potential to Achieve Goals: Good  OT Frequency: Min 2X/week    AM-PAC OT "6 Clicks" Daily Activity     Outcome Measure Help from another person eating meals?: None Help from another person taking care of personal grooming?: None Help from another person toileting, which includes using toliet, bedpan, or urinal?: A Little Help from another person bathing (including  washing, rinsing, drying)?: A Little Help from another person to put on and taking off regular upper body clothing?: A Little Help from another person to put on and taking off regular lower body clothing?: A Little 6 Click Score: 20   End of Session Equipment Utilized During Treatment: Oxygen(2LNC) Nurse Communication: Mobility status;Other (comment)(slight skin tear under skin fold on L flank)  Activity Tolerance: Patient tolerated treatment well Patient left: in bed;with call bell/phone within reach;with bed alarm set  OT Visit Diagnosis: Unsteadiness on feet (R26.81);Muscle weakness (generalized) (M62.81)                Time: 4163-8453 OT Time Calculation (min): 62 min Charges:  OT General Charges $OT Visit: 1 Visit OT Evaluation $OT Eval Low Complexity: 1 Low OT Treatments $Self Care/Home Management : 38-52 mins  Curtis Sites OTR/L  10/22/2018, 1:18 PM

## 2018-10-22 NOTE — Progress Notes (Signed)
Pt. Requesting a shower, explained to patient the safety concerns about getting in the shower with her shortness of breath. Pt verbalized understanding.

## 2018-10-22 NOTE — Progress Notes (Addendum)
PROGRESS NOTE  [name] [name]  IEP:[id number] DOB: 1951-02-05 DOA: 10/20/2018 PCP: [name], MD  Outpatient Specialists: Cardiology, Dr. Gwenlyn Found  Brief Narrative: [name] [name] is a 68 y.o. female with a history of morbid obesity, OSA, HTN, HLD, T2DM, stage III CKD who presented to the ED at the insistence of her husband for shortness of breath for several weeks limiting her already limited mobility. She had orthopnea, leg swelling, and PND and stopped exerting herself due to dyspnea, and has also had increasing cough. Evaluation in ED included elevated BNP, troponin 0.10, creatinine near baseline at 1.97. ECG is nonischemic. 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 -Some wheezing, possible bronchitis on admission,and definitely complications of obesity including OHS/OSA, and atelectasis from sedentary lifestyle. -Patient to use incentive spirometry and flutter valve, remains on oxygen, will wean as tolerated  Acute on chronic systolic, diastolic CHF: -  Echo 6/60 shows EF 45-50%, elevated left sided filling pressures. RV  systolic function ok.  -On 160 mg oral Lasix twice daily at home, with significant volume overload currently, she is on IV Lasix 160 mg IV twice daily, significant bump in creatinine today to 2.99, I will hold her IV diuresis today, she is 1.8 L negative since admission, with appears to be inaccurate, as 119 kg on admission, currently 123?? -Cardiology has been consulted  -Zaroxolyn is currently on hold  -Was counseled about fluid restriction and low-salt diet today  Wheezing/bronchitis, tobacco use:  - Dulera, albuterol prn, he is on IV steroids, will transition to prednisone - Cessation counseling discussed  AKI CKD stage IV  -Baseline creatinine around 2-2.4, it did increase to 2.99 today, this is secondary to diuresis, which I will hold, hold nephrotoxic medications.  Hyperkalemia -potassium of 5.7 today, will give Kayexalate, will hold potassium supplements.  IDT2DM: HbA1c 7.2% - Continue basal-bolus insulin and correct with SSI. Decrease 70/30 mix because she will not be eating nearly as poorly here as at home. - Continue trulicity.  OSA:  - Needs outpatient evaluation  PAD:  - Continue plavix, statin  Morbid obesity: BMI 46. Discussed that this is a primary underlying factor in her future health. - Dietitian consult  Hypothyroidism:  - Continue synthroid  Incidental pulmonary nodule: Interval development of 11 mm nodule seen in left lower lobe. This is concerning for possible neoplasm or malignancy.  - Consider one of the following in 3 months for both low-risk and high-risk individuals: (a) repeat chest CT, (b) follow-up PET-CT, or (c) tissue sampling.  PAD:  - Follow up with Dr. Oneida Alar  DVT prophylaxis: Weight-dosed lovenox Code Status: Full Family Communication: Husband at bedside Disposition Plan: Uncertain.   Consultants:  Cardiology to evaluate 1/15  Procedures:   Echocardiogram 10/21/2018: - Left ventricle: The cavity size was mildly dilated. Wall    thickness was normal. Systolic function was mildly reduced. The   estimated ejection fraction was in the range of 45% to 50%.   Diffuse hypokinesis. Doppler parameters are consistent with both   elevated ventricular end-diastolic filling pressure and elevated   left atrial filling pressure. - Left atrium: The atrium was moderately dilated.  Antimicrobials:  None     Objective: Vitals:   10/21/18 1959 10/22/18 0043 10/22/18 0536 10/22/18 0813  BP:  136/82 126/63   Pulse: 84 99 92   Resp: 16 13 20    Temp:  98.5 F (36.9 C) 97.9 F (36.6 C)   TempSrc:  Oral Oral   SpO2: 94% 98% 95% 95%  Weight:   123.5 kg   Height:        Intake/Output Summary (Last 24 hours) at 10/22/2018 1152 Last data filed at 10/22/2018 0958 Gross per 24 hour  Intake 653.77 ml  Output 2250 ml  Net -1596.23 ml   Filed Weights   10/20/18 0922 10/21/18 0325 10/22/18 0536  Weight: 118.8 kg 124.1 kg 123.5 kg    Physical exam:  Awake Alert, Oriented X 3, No new F.N deficits, Normal affect Symmetrical Chest wall movement, Minister at the bases, with bibasilar crackles, no wheezing today RRR,No Gallops,Rubs or new Murmurs, No Parasternal Heave +ve B.Sounds, Abd Soft, No tenderness, No rebound - guarding or rigidity. No Cyanosis, Clubbing , +2 edema, No new Rash or bruise    Data Reviewed: I have personally reviewed following labs and imaging studies  CBC: Recent Labs  Lab 10/20/18 0853 10/20/18 1342  WBC 13.8* 13.6*  NEUTROABS 11.3*  --   HGB 11.2* 11.3*  HCT 37.2 37.1  MCV 85.1 86.9  PLT 356 194   Basic Metabolic Panel: Recent Labs  Lab 10/20/18 0853 10/20/18 1342 10/21/18 0442 10/22/18 0453  NA 141  --  140 139  K 3.5  --  4.8 5.7*  CL 101  --  100 95*  CO2 28  --  31 34*  GLUCOSE 154*  --  191* 202*  BUN 55*  --  62* 73*  CREATININE 1.97* 2.07* 2.36* 2.99*  CALCIUM 10.1  --  10.0 10.5*   GFR: Estimated Creatinine Clearance: 23.4 mL/min (A) (by C-G formula based on SCr of 2.99  mg/dL (H)). Liver Function Tests: Recent Labs  Lab 10/20/18 0853  AST 33  ALT 20  ALKPHOS 39  BILITOT 0.8  PROT 7.0  ALBUMIN 3.6   No results for input(s): LIPASE, AMYLASE in the last 168 hours. No results for input(s): AMMONIA in the last 168 hours. Coagulation Profile: Recent Labs  Lab 10/20/18 0853  INR 0.96   Cardiac Enzymes: No results for input(s): CKTOTAL, CKMB, CKMBINDEX, TROPONINI in the last 168 hours. BNP (last 3 results) No results for input(s): PROBNP in the last 8760 hours. HbA1C: Recent Labs    10/20/18 1607  HGBA1C 7.2*   CBG: Recent Labs  Lab 10/21/18 1715 10/21/18 2103 10/22/18 0034 10/22/18 0736 10/22/18 1138  GLUCAP 181* 208* 148* 229* 181*   Lipid Profile: No results for input(s): CHOL, HDL, LDLCALC, TRIG, CHOLHDL, LDLDIRECT in the last 72 hours. Thyroid Function Tests: No results for input(s): TSH, T4TOTAL, FREET4, T3FREE, THYROIDAB in the last 72 hours. Anemia Panel: No results for input(s): VITAMINB12, FOLATE, FERRITIN, TIBC, IRON, RETICCTPCT in the last 72 hours. Urine analysis:  Component Value Date/Time   COLORURINE YELLOW 10/20/2018 0853   APPEARANCEUR HAZY (A) 10/20/2018 0853   LABSPEC 1.011 10/20/2018 0853   PHURINE 5.0 10/20/2018 [place] 10/20/2018 0853   HGBUR NEGATIVE 10/20/2018 0853   [place] 10/20/2018 0853   KETONESUR NEGATIVE 10/20/2018 0853   PROTEINUR 30 (A) 10/20/2018 0853   UROBILINOGEN 0.2 08/04/2014 1409   NITRITE NEGATIVE 10/20/2018 0853   LEUKOCYTESUR NEGATIVE 10/20/2018 0853   No results found for this or any previous visit (from the past 240 hour(s)).    Radiology Studies: Dg Chest 2 View  Result Date: 10/21/2018 CLINICAL DATA:  Congestive heart failure. EXAM: CHEST - 2 VIEW COMPARISON:  Radiograph of October 20, 2018. FINDINGS: Stable cardiomegaly. Atherosclerosis of thoracic aorta is noted. No pneumothorax is noted. No acute pulmonary disease is noted. Bony thorax is  unremarkable. IMPRESSION: Stable cardiomegaly.  No acute cardiopulmonary abnormality seen. Aortic Atherosclerosis (ICD10-I70.0). Electronically Signed   By: Marijo Conception, M.D.   On: 10/21/2018 07:43   Ct Chest Wo Contrast  Result Date: 10/20/2018 CLINICAL DATA:  Shortness of breath, cough. EXAM: CT CHEST WITHOUT CONTRAST TECHNIQUE: Multidetector CT imaging of the chest was performed following the standard protocol without IV contrast. COMPARISON:  CT scan of April 08, 2015. FINDINGS: Cardiovascular: Atherosclerosis of thoracic aorta is noted without aneurysm formation. Mild cardiomegaly is noted. No pericardial effusion is noted. Mild coronary artery calcifications are noted. Mediastinum/Nodes: No enlarged mediastinal or axillary lymph nodes. Thyroid gland, trachea, and esophagus demonstrate no significant findings. Lungs/Pleura: No pneumothorax or pleural effusion is noted. 11 x 10 mm nodule is seen in left lower lobe best seen on image number 89 of series 4. This was not present on prior exam and is concerning for possible neoplasm. 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 Upper Abdomen: No acute abnormality. Musculoskeletal: No chest wall mass or suspicious bone lesions identified. IMPRESSION: Interval development of 11 mm nodule seen in left lower lobe. This is concerning for possible neoplasm or malignancy. Consider one of the following in 3 months for both low-risk and high-risk individuals: (a) repeat chest CT, (b) follow-up PET-CT, or (c) tissue sampling. This recommendation follows the consensus statement: Guidelines for Management of Incidental Pulmonary Nodules Detected on CT Images: From the Fleischner Society 2017; Radiology 2017; 284:228-243. Mild coronary artery calcifications are noted. Aortic Atherosclerosis (ICD10-I70.0). Electronically Signed   By: Marijo Conception, M.D.   On: 10/20/2018 13:18    Scheduled Meds: . allopurinol  100 mg Oral Daily  . calcium-vitamin D  1  tablet Oral Q breakfast  . carvedilol  3.125 mg Oral BID WC  . clopidogrel  75 mg Oral Daily  . Dulaglutide  1.5 mg Subcutaneous Q Mon  . enoxaparin (LOVENOX) injection  60 mg Subcutaneous Q24H  . fenofibrate  160 mg Oral Daily  . insulin aspart  0-5 Units Subcutaneous QHS  . insulin aspart  0-9 Units Subcutaneous TID WC  . insulin aspart protamine- aspart  30 Units Subcutaneous BID WC  . ipratropium-albuterol  3 mL Nebulization TID  . levothyroxine  150 mcg Oral QAC breakfast  . magnesium oxide  600 mg Oral Daily  . methylPREDNISolone (SOLU-MEDROL) injection  40 mg Intravenous Q12H  . mometasone-formoterol  2 puff Inhalation BID  . multivitamin with minerals  1 tablet Oral Daily  . pneumococcal 23 valent vaccine  0.5 mL Intramuscular Tomorrow-1000  . potassium chloride SA  40 mEq Oral Daily  . simvastatin  20 mg Oral QHS  . sodium chloride flush  3 mL Intravenous Q12H  . tiZANidine  4 mg Oral BID   Continuous Infusions: . sodium chloride Stopped (10/22/18 0200)  . furosemide Stopped (10/22/18 0154)     LOS: 2 days    Phillips Climes, MD [facility] [email or web address] Password [facility] 10/22/2018, 11:52 AM

## 2018-10-22 NOTE — Progress Notes (Signed)
SATURATION QUALIFICATIONS: (This note is used to comply with regulatory documentation for home oxygen)  Patient Saturations on Room Air at Rest = 88%  Patient Saturations on Room Air while Ambulating = 84%  Patient Saturations on 2 Liters of oxygen while Ambulating = 94%  Please briefly explain why patient needs home oxygen: Pt required supplemental oxygen to maintain SpO2 >/88% with ambulation.  Mabeline Caras, PT, DPT Acute Rehabilitation Services  Pager [phone] Office [phone]

## 2018-10-22 NOTE — Evaluation (Addendum)
Physical Therapy Evaluation Patient Details Name: [name] [name] MRN: [id number] DOB: 1951/08/21 Today's Date: 10/22/2018   History of Present Illness  Pt is a 68 y.o. female admitted 10/20/18 with worsening SOB. Worked up for CHF exacerbation. 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 PMH includes CHF, CKD III, HTN, DM2, obesity, OSA.    Clinical Impression  Pt presents with an overall decrease in functional mobility secondary to above. PTA, pt ambulatory with rollator and SPC; lives with husband who works; has aide support 6x/wk to assist with household tasks and ADLs. Today, pt ambulatory with rollator at supervision-level; educ on energy conservation and cues for safety with rollator (I.e. locking brakes prior to seated rest). Pt required 2L O2 [place] to maintain SpO2 >/88% (see SATURATIONS QUALIFICATIONS note). Pt would benefit from continued acute PT services to maximize functional mobility and independence prior to d/c with HHPT services.     Follow Up Recommendations Home health PT;Supervision - Intermittent    Equipment Recommendations  None recommended by PT    Recommendations for Other Services       Precautions / Restrictions Precautions Precautions: Fall;Other (comment) Precaution Comments: Currently requiring 2L O2 [place] to maintain SpO2 >90% Restrictions Weight Bearing Restrictions: No      Mobility  Bed Mobility               General bed mobility comments: Received sitting EOB  Transfers Overall transfer level: Needs assistance Equipment used: 4-wheeled walker Transfers: Sit to/from Stand Sit to Stand: Supervision         General transfer comment: Required cues to lock rollator brakes prior to sitting in hallway; supervision for safety  Ambulation/Gait Ambulation/Gait assistance: Supervision Gait Distance (Feet): 200 Feet Assistive device: 4-wheeled walker Gait Pattern/deviations: Step-through pattern;Decreased stride  length;Trunk flexed   Gait velocity interpretation: 1.31 - 2.62 ft/sec, indicative of limited community ambulator General Gait Details: Fast, swerving gait with rollator requiring cues to slow speed as pt SOB; Spo2 down to 84% on RA, returning to 94% on 2L O2 [place]. Pt required seated rest break on rollator seat secondary to fatigue and DOE  Stairs            Wheelchair Mobility    Modified Rankin (Stroke Patients Only)       Balance Overall balance assessment: Needs assistance   Sitting balance-Leahy Scale: Good       Standing balance-Leahy Scale: Fair Standing balance comment: Can static stand without UE support; dynamic stability improved with at least single UE support                             Pertinent Vitals/Pain Pain Assessment: No/denies pain    Home Living Family/patient expects to be discharged to:: Private residence Living Arrangements: Spouse/significant other Available Help at Discharge: Family;Available PRN/intermittently Type of Home: Apartment Home Access: Stairs to enter Entrance Stairs-Rails: Right Entrance Stairs-Number of Steps: 3 Home Layout: One level Home Equipment: Hand held shower head;Shower seat;Cane - single point;Walker - 4 wheels Additional Comments: Husband works    Prior Function Level of Independence: Needs assistance   Gait / Transfers Assistance Needed: Ambulatory with intermittent use of rollator or SPC. Community amb with rollator  ADL's / Homemaking Assistance Needed: Aide comes 3 hrs/day for 6 days/wk to assist with household tasks (food prep, cleaning) and ADLs (bathing, setting out clothes, dressing)  Comments: Not on home O2 at baseline     Hand  Dominance   Dominant Hand: Right    Extremity/Trunk Assessment   Upper Extremity Assessment Upper Extremity Assessment: Overall WFL for tasks assessed    Lower Extremity Assessment Lower Extremity Assessment: Generalized weakness    Cervical / Trunk  Assessment Cervical / Trunk Assessment: Kyphotic  Communication   Communication: No difficulties  Cognition Arousal/Alertness: Awake/alert Behavior During Therapy: WFL for tasks assessed/performed Overall Cognitive Status: Within Functional Limits for tasks assessed                                 General Comments: WFL for simple tasks. Some decreased attention and poor insight into deficits, likely baseline cognition      General Comments General comments (skin integrity, edema, etc.): Husband present at beginning of session    Exercises     Assessment/Plan    PT Assessment Patient needs continued PT services  PT Problem List Decreased strength;Decreased activity tolerance;Decreased balance;Decreased mobility;Cardiopulmonary status limiting activity       PT Treatment Interventions DME instruction;Gait training;Stair training;Functional mobility training;Therapeutic activities;Therapeutic exercise;Balance training;Patient/family education    PT Goals (Current goals can be found in the Care Plan section)  Acute Rehab PT Goals Patient Stated Goal: Return home without having to wear oxygen PT Goal Formulation: With patient Time For Goal Achievement: 11/05/18 Potential to Achieve Goals: Fair    Frequency Min 3X/week   Barriers to discharge Decreased caregiver support Husband works majority of day; aide support 3 hrs/day    Co-evaluation               AM-PAC PT "6 Clicks" Mobility  Outcome Measure Help needed turning from your back to your side while in a flat bed without using bedrails?: None Help needed moving from lying on your back to sitting on the side of a flat bed without using bedrails?: A Little Help needed moving to and from a bed to a chair (including a wheelchair)?: A Little Help needed standing up from a chair using your arms (e.g., wheelchair or bedside chair)?: A Little Help needed to walk in hospital room?: A Little Help needed  climbing 3-5 steps with a railing? : A Lot 6 Click Score: 18    End of Session Equipment Utilized During Treatment: Oxygen Activity Tolerance: Patient tolerated treatment well Patient left: in bed;with call bell/phone within reach(seated EOB) Nurse Communication: Mobility status PT Visit Diagnosis: Other abnormalities of gait and mobility (R26.89)    Time: 0347-4259 PT Time Calculation (min) (ACUTE ONLY): 24 min   Charges:   PT Evaluation $PT Eval Moderate Complexity: 1 Mod PT Treatments $Gait Training: 8-22 mins      Mabeline Caras, PT, DPT Acute Rehabilitation Services  Pager [phone] Office [phone]  Derry Lory 10/22/2018, 11:52 AM

## 2018-10-23 DIAGNOSIS — N184 Chronic kidney disease, stage 4 (severe): Secondary | ICD-10-CM

## 2018-10-23 DIAGNOSIS — Z720 Tobacco use: Secondary | ICD-10-CM

## 2018-10-23 DIAGNOSIS — N179 Acute kidney failure, unspecified: Secondary | ICD-10-CM

## 2018-10-23 LAB — BASIC METABOLIC PANEL
Anion gap: 11 (ref 5–15)
BUN: 84 mg/dL — ABNORMAL HIGH (ref 8–23)
CO2: 35 mmol/L — ABNORMAL HIGH (ref 22–32)
Calcium: 10.3 mg/dL (ref 8.9–10.3)
Chloride: 95 mmol/L — ABNORMAL LOW (ref 98–111)
Creatinine, Ser: 2.72 mg/dL — ABNORMAL HIGH (ref 0.44–1.00)
GFR calc Af Amer: 20 mL/min — ABNORMAL LOW (ref >60)
GFR calc non Af Amer: 17 mL/min — ABNORMAL LOW (ref >60)
Glucose, Bld: 121 mg/dL — ABNORMAL HIGH (ref 70–99)
Potassium: 4.3 mmol/L (ref 3.5–5.1)
Sodium: 141 mmol/L (ref 135–145)

## 2018-10-23 LAB — GLUCOSE, CAPILLARY
Glucose-Capillary: 144 mg/dL — ABNORMAL HIGH (ref 70–99)
Glucose-Capillary: 212 mg/dL — ABNORMAL HIGH (ref 70–99)
Glucose-Capillary: 260 mg/dL — ABNORMAL HIGH (ref 70–99)
Glucose-Capillary: 165 mg/dL — ABNORMAL HIGH (ref 70–99)

## 2018-10-23 LAB — MAGNESIUM: Magnesium: 2.8 mg/dL — ABNORMAL HIGH (ref 1.7–2.4)

## 2018-10-23 MED ORDER — ISOSORB DINITRATE-HYDRALAZINE 20-37.5 MG PO TABS
1.0000 | ORAL_TABLET | Freq: Three times a day (TID) | ORAL | Status: DC
  Administered 2018-10-23 – 2018-10-24 (×4): 1 via ORAL
  Filled 2018-10-23 (×4): qty 1

## 2018-10-23 MED ORDER — METOLAZONE 2.5 MG PO TABS
2.5000 mg | ORAL_TABLET | Freq: Once | ORAL | Status: AC
  Administered 2018-10-23: 2.5 mg via ORAL
  Filled 2018-10-23: qty 1

## 2018-10-23 MED ORDER — PREDNISONE 10 MG PO TABS
10.0000 mg | ORAL_TABLET | Freq: Every day | ORAL | Status: DC
  Administered 2018-10-24: 10 mg via ORAL
  Filled 2018-10-23: qty 1

## 2018-10-23 MED ORDER — POLYETHYLENE GLYCOL 3350 17 G PO PACK
34.0000 g | PACK | Freq: Every day | ORAL | Status: DC
  Administered 2018-10-23 – 2018-10-27 (×4): 34 g via ORAL
  Filled 2018-10-23 (×8): qty 2

## 2018-10-23 MED ORDER — FUROSEMIDE 10 MG/ML IJ SOLN
15.0000 mg/h | INTRAVENOUS | Status: DC
  Administered 2018-10-23 – 2018-10-26 (×5): 15 mg/h via INTRAVENOUS
  Filled 2018-10-23: qty 20
  Filled 2018-10-23 (×2): qty 25
  Filled 2018-10-23: qty 21
  Filled 2018-10-23 (×2): qty 25

## 2018-10-23 MED ORDER — FUROSEMIDE 10 MG/ML IJ SOLN
80.0000 mg | Freq: Once | INTRAMUSCULAR | Status: AC
  Administered 2018-10-23: 80 mg via INTRAVENOUS
  Filled 2018-10-23: qty 8

## 2018-10-23 NOTE — Consult Note (Addendum)
Advanced Heart Failure Team Consult Note   Primary Physician: [name], MD PCP-Cardiologist: Dr Gwenlyn Found  Reason for Consultation: A/C combined HF  HPI:    [name] [name] is seen today for evaluation of A/C combined HF at the request of Dr Gwenlyn Found.   [name] [name] is a 68 y.o. female with a history of TIAs, tobacco use, HTN, DM, HL, PAD followed by Dr Oneida Alar, chronic diastolic HF, CKD IV followed by Dr Florene Glen, hypothyroidism, morbid obesity, and LBBB.  She was last seen in Dr Kennon Holter office 07/2017. She was referred from neuro due to cardiac risk factors and hx of TIAs.   Her primary care doctor has been dosing her diuretics. She was previously taking lasix 160 mg BID + metolazone 5 mg three times weekly. She said she ran out of metolazone a few weeks ago and the pharmacy would not let her refill it. She started to hold onto fluid after that. Weight is up 15+ lbs. +orthopnea. +cough. +faituge. Appetite is great. No CP. She had still been urinating a lot at home. Was taking all medications besides metolazone. She does not snore that she knows of. 1 pack of cigarettes will last her 3-4 days. No ETOH or drug use. Lives with her husband, who has to take care of his brother with massive CVA, so is home alone a lot. Has an aide that helps her. She was previously able to walk around grocery store with no SOB. No issues with transportation or affording medications.   She presented to [facility] 10/20/2018 with SOB x 2 weeks, BLE edema, and orthopnea. Has required O2 since admission.  Pertinent admission labs include: K 3.5, creatinine 1.97, BNP 500, troponin 0.10, WBC 13.8. CXR stable CM. No acute cardiopulmonary abnormality.   She had been diuresing with 160 mg IV lasix BID with sluggish diuresis. Weight has gone up 3 lbs since admission. She received 2 doses of metolazone since admit, but was stopped 2 days ago. Last stopped on 1/14 with rise in creatinine to 2.99. Creatinine now 2.72 (baseline  unclear.) She had hyperkalemia to 5.7 yesterday. Received kayexelate. K is 4.3 today.   She is tearful today. She wants to feel better and get out of the hospital. SOB is a little bit better. She does not want HD.   Echo 10/2018: EF 45-50%, diffuse HK, LA mildly dilated  Echo 04/2015: EF 60-65%, grade 1 DD  LHC 07/02/14: essentially normal coronary arteries and EF of 40-45%  Myoview stress test 06/24/14: high risk with anterolateral ischemia.   Echo 11/05/13: EF 45-50% and question of a lateral wall motion abnormality  Review of systems complete and found to be negative unless listed in HPI.   SH: lives with her husband. No ETOH or drugs. Current smoker. 1 pack lasts 3-4 days. No problems with affording medications or transportation.   FH: Mother with CHF  Home Medications Prior to Admission medications   Medication Sig Start Date End Date Taking? Authorizing Provider  albuterol (PROVENTIL) (2.5 MG/3ML) 0.083% nebulizer solution Take 2.5 mg by nebulization every 6 (six) hours as needed for wheezing or shortness of breath.   Yes [provider]  allopurinol (ZYLOPRIM) 100 MG tablet Take 100 mg by mouth every morning.    Yes [provider]  B Complex Vitamins (VITAMIN B COMPLEX PO) Take 1 tablet by mouth 2 (two) times daily.    Yes [provider]  budesonide-formoterol (SYMBICORT) 160-4.5 MCG/ACT inhaler Inhale 2 puffs into the  lungs 2 (two) times daily as needed (for SOB).    Yes [provider]  Calcium Carbonate-Vit D-Min (CALCIUM 1200 PO) Take 1,200 mg by mouth daily.    Yes [provider]  cetirizine (ZYRTEC) 10 MG tablet Take 10 mg by mouth daily as needed for allergies.  03/14/15  Yes [provider]  clopidogrel (PLAVIX) 75 MG tablet TAKE 1 TABLET BY MOUTH EVERY DAY Patient taking differently: Take 75 mg by mouth daily.  06/18/17  Yes Waynetta Sandy, MD  Dulaglutide 1.5 MG/0.5ML SOPN Inject 1.5 mg into the skin every  Monday.    Yes [provider]  fenofibrate (TRICOR) 145 MG tablet TAKE 1 TABLET (145 MG TOTAL) BY MOUTH DAILY. Patient taking differently: Take 145 mg by mouth daily.  05/11/15  Yes Lorretta Harp, MD  fluticasone (FLONASE) 50 MCG/ACT nasal spray Place 2 sprays into the nose as needed for allergies.    Yes [provider]  furosemide (LASIX) 80 MG tablet Take 160 mg by mouth 2 (two) times daily.   Yes [provider]  hydrOXYzine (ATARAX/VISTARIL) 25 MG tablet Take 25 mg by mouth 2 (two) times daily as needed for itching.   Yes [provider]  insulin lispro protamine-lispro (HUMALOG 75/25 MIX) (75-25) 100 UNIT/ML SUSP injection Inject 40 Units into the skin 2 (two) times daily with a meal.    Yes [provider]  levothyroxine (SYNTHROID, LEVOTHROID) 150 MCG tablet Take 150 mcg by mouth daily before breakfast.   Yes [provider]  MAGNESIUM PO Take 600 mg by mouth daily.   Yes [provider]  metolazone (ZAROXOLYN) 2.5 MG tablet Take 2.5 mg by mouth 2 (two) times daily. Take with laix 08/15/17  Yes [provider]  mirabegron ER (MYRBETRIQ) 50 MG TB24 tablet Take 50 mg by mouth daily.   Yes [provider]  Multiple Vitamin (MULTIVITAMIN WITH MINERALS) TABS tablet Take 1 tablet by mouth daily.   Yes [provider]  naftifine (NAFTIN) 1 % cream APPLY TO THE AFFECTED AREA ON SKIN EVERY DAY Patient taking differently: Apply 1 application topically daily as needed (skin irritation).  05/23/15  Yes Hyatt, Max T, DPM  oxyCODONE-acetaminophen (PERCOCET) 10-325 MG tablet Take 1 tablet by mouth every 4 (four) hours as needed for pain.   Yes [provider]  polyvinyl alcohol (LIQUIFILM TEARS) 1.4 % ophthalmic solution Place 2 drops into both eyes daily as needed for dry eyes.   Yes [provider]  potassium chloride SA (K-DUR,KLOR-CON) 20 MEQ tablet Take 40 mEq by mouth 2 (two) times daily.     Yes [provider]  simvastatin (ZOCOR) 20 MG tablet Take 20 mg by mouth at bedtime. 04/08/17  Yes [provider]  tiZANidine (ZANAFLEX) 4 MG capsule Take 4 mg by mouth 2 (two) times daily.    Yes [provider]    Past Medical History: Past Medical History:  Diagnosis Date  . Anemia   . Arthritis   . Asthma   . Bronchitis   . Chronic kidney disease   . Chronic kidney disease   . Chronic pain syndrome   . Cramping of feet   . Diabetes mellitus   . GERD (gastroesophageal reflux disease)   . Gout   . Headache   . History of hip replacement, total   . History of transfusion   . Hx MRSA infection    abscess left groin  . Hx of cardiac cath  06/2014  . Hyperlipidemia   . Hypertension   . Hypothyroid   . 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 Morbidly obese ([place])   . Peripheral vascular disease ([place])   . Positive cardiac stress test 12/2013   anterior and lateral ischemia on Myoview  . Sleep apnea    UNABLE TO TOLERATE C PAP  . Stress incontinence   . Thyroid disease   . TIA (transient ischemic attack)     X2 NO RESIDUAL PROBLEMS    Past Surgical History: Past Surgical History:  Procedure Laterality Date  . ABDOMINAL HYSTERECTOMY    . COLON SURGERY  1995   DUE TO POLYP  . FOREARM FRACTURE SURGERY     Left arm  . HERNIA REPAIR     w/ mesh  . INCISION AND DRAINAGE ABSCESS Left 02/04/2013   Procedure: INCISION AND DRAINAGE LEFT BUTTOCK ABSCESS; INCISION AND DRAINAGE LEFT BREAST ABSCESS;  Surgeon: Harl Bowie, MD;  Location: [place];  Service: General;  Laterality: Left;  . INCISION AND DRAINAGE ABSCESS N/A 02/12/2013   Procedure: INCISION AND DEBRIDEMENT BUTTOCK WOUND ;  Surgeon: [name]. Georgette Dover, MD;  Location: [place];  Service: General;  Laterality: N/A;  . INCISION AND DRAINAGE ABSCESS N/A 02/14/2013   Procedure: INCISION AND DRAINAGE/DRESSING CHANGE;  Surgeon: Harl Bowie, MD;  Location: [place];  Service: General;  Laterality: N/A;  .  INCISION AND DRAINAGE ABSCESS N/A 03/21/2015   Procedure: INCISION AND DRAINAGE PUBIC ABSCESS;  Surgeon: Excell Seltzer, MD;  Location: [facility];  Service: General;  Laterality: N/A;  . INCISION AND DRAINAGE PERIRECTAL ABSCESS Left 02/10/2013   Procedure: IRRIGATION AND DEBRIDEMENT OF BUTTOCK/PERINEAL ABSCESS;  Surgeon: [name]. Georgette Dover, MD;  Location: [place];  Service: General;  Laterality: Left;  . INCISION AND DRAINAGE PERIRECTAL ABSCESS N/A 02/16/2013   Procedure: IRRIGATION AND DEBRIDEMENT PERINEAL ABSCESS;  Surgeon: Zenovia Jarred, MD;  Location: [place];  Service: General;  Laterality: N/A;  . IRRIGATION AND DEBRIDEMENT ABSCESS N/A 02/06/2013   Procedure: IRRIGATION AND DEBRIDEMENT BUTTOCK ABSCESS AND DRESSING CHANGE;  Surgeon: Harl Bowie, MD;  Location: [place];  Service: General;  Laterality: N/A;  . IRRIGATION AND DEBRIDEMENT ABSCESS Left 02/08/2013   Procedure: IRRIGATION AND DEBRIDEMENT ABSCESS/DRESSING CHANGE;  Surgeon: Gwenyth Ober, MD;  Location: [place];  Service: General;  Laterality: Left;  . JOINT REPLACEMENT  2010 / 2012   . LAPAROSCOPIC CHOLECYSTECTOMY    . LEFT HEART CATHETERIZATION WITH CORONARY ANGIOGRAM N/A 07/02/2014   Procedure: LEFT HEART CATHETERIZATION WITH CORONARY ANGIOGRAM;  Surgeon: Lorretta Harp, MD;  Location: [facility];  Service: Cardiovascular;  Laterality: N/A;  . LOWER EXTREMITY ANGIOGRAM Right 04/20/2016   Procedure: Lower Extremity Angiogram;  Surgeon: Elam Dutch, MD;  Location: [place] [facility];  Service: Cardiovascular;  Laterality: Right;  . PERIPHERAL VASCULAR CATHETERIZATION N/A 04/20/2016   Procedure: Abdominal Aortogram;  Surgeon: Elam Dutch, MD;  Location: [place] [facility];  Service: Cardiovascular;  Laterality: N/A;  . PERIPHERAL VASCULAR CATHETERIZATION Right 04/20/2016   Procedure: Peripheral Vascular Intervention;  Surgeon: Elam Dutch, MD;  Location: [place] [facility];  Service: Cardiovascular;  Laterality: Right;  popiteal   . THYROIDECTOMY    . TOTAL HIP REVISION Right 08/11/2014   Procedure: RIGHT ACETABULAR REVISION;  Surgeon: Gearlean Alf, MD;  Location: [facility];  Service: Orthopedics;  Laterality: Right;    Family History: Family History  Problem Relation Age of Onset  .  Diabetes Brother   . Cardiomyopathy Mother   . Heart disease Mother   . Cancer Father     Social History: Social History   Socioeconomic History  . Marital status: Legally Separated    Spouse name: Not on file  . Number of children: 1  . Years of education: college  . Highest education level: Not on file  Occupational History    Comment: Disabled  Social Needs  . Financial resource strain: Not on file  . Food insecurity:    Worry: Not on file    Inability: Not on file  . Transportation needs:    Medical: Not on file    Non-medical: Not on file  Tobacco Use  . Smoking status: Current Every Day Smoker    Packs/day: 0.25    Years: 40.00    Pack years: 10.00    Types: Cigarettes  . Smokeless tobacco: Never Used  Substance and Sexual Activity  . Alcohol use: Yes    Alcohol/week: 1.0 standard drinks    Types: 1 Standard drinks or equivalent per week    Comment: OCC  . Drug use: No    Comment: Quit four years ago.  Marland Kitchen Sexual activity: Not on file  Lifestyle  . Physical activity:    Days per week: Not on file    Minutes per session: Not on file  . Stress: Not on file  Relationships  . Social connections:    Talks on phone: Not on file    Gets together: Not on file    Attends religious service: Not on file    Active member of club or organization: Not on file    Attends meetings of clubs or organizations: Not on file    Relationship status: Not on file  Other Topics Concern  . Not on file  Social History Narrative   Patient is separated  and lives at home with her friend. Patient is disabled.   Education one year of college   Right handed   Caffeine two cups daily.       Allergies:  Allergies    Allergen Reactions  . Nebivolol Swelling  . Ace Inhibitors Swelling and Other (See Comments)    Tongue swell  . Bystolic [Nebivolol Hcl] Swelling and Other (See Comments)    Chest pain   . Morphine And Related Itching    Objective:    Vital Signs:   Temp:  [98.5 F (36.9 C)-98.7 F (37.1 C)] 98.6 F (37 C) (01/16 0435) Pulse Rate:  [77-103] 96 (01/16 0816) Resp:  [16-20] 20 (01/16 0435) BP: (112-146)/(57-84) 146/82 (01/16 0816) SpO2:  [96 %-100 %] 97 % (01/16 0435) FiO2 (%):  [98 %] 98 % (01/15 1421) Weight:  [125.6 kg] 125.6 kg (01/16 0435) Last BM Date: 10/20/18  Weight change: Filed Weights   10/21/18 0325 10/22/18 0536 10/23/18 0435  Weight: 124.1 kg 123.5 kg 125.6 kg    Intake/Output:   Intake/Output Summary (Last 24 hours) at 10/23/2018 0936 Last data filed at 10/23/2018 0600 Gross per 24 hour  Intake 720 ml  Output 900 ml  Net -180 ml      Physical Exam    General:  Obese. Tearful. Sitting on side of bed. No resp difficulty HEENT: normal Neck: supple. JVP to jaw. Carotids 2+ bilat; no bruits. No lymphadenopathy or thyromegaly appreciated. Cor: PMI nondisplaced. Regular rate & rhythm. No rubs, gallops or murmurs. Lungs: tight, occ wheeze. Sitting on side of bed.  Abdomen: soft, nontender, nondistended.  No hepatosplenomegaly. No bruits or masses. Good bowel sounds. Extremities: no cyanosis, clubbing, rash, 2+ edema, cool extremities.  Neuro: alert & orientedx3, cranial nerves grossly intact. moves all 4 extremities w/o difficulty. Affect pleasant   Telemetry   NSR 90s with IVCD. Personally reviewed.   EKG    NSR with LBBB 96 bpm. Personally reviewed.   Labs   Basic Metabolic Panel: Recent Labs  Lab 10/20/18 0853 10/20/18 1342 10/21/18 0442 10/22/18 0453 10/23/18 0555  NA 141  --  140 139 141  K 3.5  --  4.8 5.7* 4.3  CL 101  --  100 95* 95*  CO2 28  --  31 34* 35*  GLUCOSE 154*  --  191* 202* 121*  BUN 55*  --  62* 73* 84*  CREATININE  1.97* 2.07* 2.36* 2.99* 2.72*  CALCIUM 10.1  --  10.0 10.5* 10.3  MG  --   --   --   --  2.8*    Liver Function Tests: Recent Labs  Lab 10/20/18 0853  AST 33  ALT 20  ALKPHOS 39  BILITOT 0.8  PROT 7.0  ALBUMIN 3.6   No results for input(s): LIPASE, AMYLASE in the last 168 hours. No results for input(s): AMMONIA in the last 168 hours.  CBC: Recent Labs  Lab 10/20/18 0853 10/20/18 1342  WBC 13.8* 13.6*  NEUTROABS 11.3*  --   HGB 11.2* 11.3*  HCT 37.2 37.1  MCV 85.1 86.9  PLT 356 365    Cardiac Enzymes: No results for input(s): CKTOTAL, CKMB, CKMBINDEX, TROPONINI in the last 168 hours.  BNP: BNP (last 3 results) Recent Labs    10/20/18 0853  BNP 500.3*    ProBNP (last 3 results) No results for input(s): PROBNP in the last 8760 hours.   CBG: Recent Labs  Lab 10/22/18 0736 10/22/18 1138 10/22/18 1651 10/22/18 2113 10/23/18 0805  GLUCAP 229* 181* 234* 248* 144*    Coagulation Studies: No results for input(s): LABPROT, INR in the last 72 hours.   Imaging    No results found.   Medications:     Current Medications: . allopurinol  100 mg Oral Daily  . calcium-vitamin D  1 tablet Oral Q breakfast  . carvedilol  3.125 mg Oral BID WC  . clopidogrel  75 mg Oral Daily  . Dulaglutide  1.5 mg Subcutaneous Q Mon  . fenofibrate  160 mg Oral Daily  . heparin injection (subcutaneous)  5,000 Units Subcutaneous Q8H  . insulin aspart  0-5 Units Subcutaneous QHS  . insulin aspart  0-9 Units Subcutaneous TID WC  . insulin aspart protamine- aspart  30 Units Subcutaneous BID WC  . ipratropium-albuterol  3 mL Nebulization TID  . levothyroxine  150 mcg Oral QAC breakfast  . magnesium oxide  600 mg Oral Daily  . mometasone-formoterol  2 puff Inhalation BID  . multivitamin with minerals  1 tablet Oral Daily  . pneumococcal 23 valent vaccine  0.5 mL Intramuscular Tomorrow-1000  . polyethylene glycol  34 g Oral Daily  . predniSONE  30 mg Oral Q breakfast  .  simvastatin  20 mg Oral QHS  . sodium chloride flush  3 mL Intravenous Q12H  . tiZANidine  4 mg Oral BID     Infusions: . sodium chloride Stopped (10/22/18 0200)       Patient Profile   [name] [name] is a 68 y.o. female with a history of TIAs, tobacco use, HTN, DM, HL, PAD followed by Dr  Fields, chronic diastolic HF, CKD IV followed by Dr Florene Glen, hypothyroidism, morbid obesity, and LBBB.  Admitted with volume overload despite high dose diuretics at home. Dr Gwenlyn Found requests that we assist with diuresis in setting of AKI.  Assessment/Plan   1. A/C combined (primary diastolic) HF - LHC 3295 with normal coronaries.  - Echo 1/15: EF 45-50%. Echo 04/2015: EF 60-65%, grade 1 DD. Echo 10/2018: EF 45-50%, diffuse HK, LA mildly dilated - Volume elevated. Up at least 15 lbs. Complicated by CKD IV with AKI.  - Lasix has been on hold with AKI. Will need to resume. Give 80 mg IV lasix + 2.5 mg metolazone now, then start lasix drip @ 15 mg/hr. - Continue coreg 3.125 mg BID - Add bidil 1 tab TID. SBP 140s.   2. CKD IV with AKI - Unclear baseline. No labs in epic since 04/2017, but creatinine was 2.44 at that time. - Followed with Dr Florene Glen outpatient.  - Creatinine 2.72 today.  - May need to consult renal. She wants to avoid HD.   3. Acute hypoxic respiratory failure - On 2 L O2. Does not wear any oxygen at home.  - On prednisone and PRN nebs.  - No PFTs on file.   4. Tobacco use - Continues to smoke. 1 pack lasts her 3-4 days.   5. Obesity - Body mass index is 47.53 kg/m.  6. Hyperkalemia - K 5.7 yesterday. Given kayexalate. K 4.3 today  7. ?OSA - Needs sleep study.   8. Hx of TIA - Continue plavix and simvastatin.   Medication concerns reviewed with patient and pharmacy team. Barriers identified: none at the time.   Length of Stay: [place], NP  10/23/2018, 9:36 AM  Advanced Heart Failure Team Pager [phone] (M-F; 7a - 4p)  Please contact [facility] for  night-coverage after hours (4p -7a ) and weekends on [email or web address]  68 yo with CKD stage IV, chronic primarily diastolic CHF, smoking/suspected COPD presented with dyspnea likely primarily due to volume overload.  She ran out of metolazone a few weeks prior to admission and weight rose steadily.  She was initially diuresed here with IV Lasix 160 bid but creatinine rose from 1.97 => 2.7 today and she has been off diuretics since yesterday morning.    ECG with NSR, LBBB.   On exam, she is volume overloaded with JVP 14+ cm and 2+ edema to knees.  Regular S1S2. Mild crackles at bases.   1. Acute on chronic primarily diastolic CHF: Echo with EF 45-50% in 1/20. CHF is complicated by CKD stage IV at baseline, now with AKI in hospital.  She remains very volume overloaded and short of breath.   - Continue Coreg.  - Agree with use of Bidil, make sure that BP does not drop significantly (currently SBP 140s).  - Would give Lasix 80 mg IV x 1 followed by Lasix 15 mg/hr with metolazone 2.5 x 1.  Hopefully, if we can effect some diuresis, renal venous pressure will lower and creatinine will improve.   2. AKI on CKD stage IV: Patient is volume overloaded but creatinine is rising.  Difficult situation.  Will continue to attempt diuresis as above.  If creatinine continues to rise, may need input from nephrology.  She likely would be a dialysis candidate if necessary.  3. Smoking/suspected COPD:  I urged her to quit smoking.  Currently on prednisone for ?component of COPD exacerbation.  4. H/o TIA: On Plavix + statin.  Loralie Champagne 10/23/2018 11:18 AM

## 2018-10-23 NOTE — Progress Notes (Signed)
Occupational Therapy Treatment Patient Details Name: [name] [name] MRN: [id number] DOB: 02-09-1951 Today's Date: 10/23/2018    History of present illness Pt is a 68 y.o. female admitted 10/20/18 with worsening SOB. Worked up for CHF exacerbation. 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 PMH includes CHF, CKD III, HTN, DM2, obesity, OSA.   OT comments  Pt making good progress with functional goals. Pt very pleasant, cooperative and jovial.Pt participated in ADL transfers to toilet and shower, toileting tasks and grooming/hygiene tasks standing at sink. Pt's sister present this session. OT will continue to follow acutely  Follow Up Recommendations  Home health OT    Equipment Recommendations  None recommended by OT    Recommendations for Other Services      Precautions / Restrictions Precautions Precautions: Fall;Other (comment) Precaution Comments: 2 L O2 Restrictions Weight Bearing Restrictions: No       Mobility Bed Mobility Overal bed mobility: Independent                Transfers Overall transfer level: Needs assistance Equipment used: Rolling walker (2 wheeled);1 person hand held assist Transfers: Sit to/from Stand Sit to Stand: Supervision Stand pivot transfers: Supervision       General transfer comment: cues for safety    Balance Overall balance assessment: Needs assistance Sitting-balance support: Feet supported Sitting balance-Leahy Scale: Good     Standing balance support: Bilateral upper extremity supported;During functional activity Standing balance-Leahy Scale: Fair                             ADL either performed or assessed with clinical judgement   ADL Overall ADL's : Needs assistance/impaired     Grooming: Wash/dry face;Wash/dry hands;Supervision/safety;Standing   Upper Body Bathing: Supervision/ safety;Sitting   Lower Body Bathing: Min guard;Sit to/from stand;Supervison/ safety   Upper Body  Dressing : Sitting;Supervision/safety   Lower Body Dressing: Min guard;Sit to/from stand;Supervision/safety Lower Body Dressing Details (indicate cue type and reason): donnes sicks sitting EOB Toilet Transfer: Designer, television/film set Details (indicate cue type and reason): pt ambulating too quickly to bathroom, getting tangled in O2 lines Toileting- Clothing Manipulation and Hygiene: Sit to/from stand;Supervision/safety       Functional mobility during ADLs: Rolling walker;Supervision/safety General ADL Comments: pt stood at sink for hygiene and ADL tasks     Vision Patient Visual Report: No change from baseline     Perception     Praxis      Cognition Arousal/Alertness: Awake/alert Behavior During Therapy: WFL for tasks assessed/performed Overall Cognitive Status: Within Functional Limits for tasks assessed                                          Exercises     Shoulder Instructions       General Comments      Pertinent Vitals/ Pain       Pain Assessment: No/denies pain  Home Living                                          Prior Functioning/Environment              Frequency  Min 2X/week        Progress Toward Goals  OT Goals(current goals can now be found in the care plan section)  Progress towards OT goals: Progressing toward goals     Plan Discharge plan remains appropriate    Co-evaluation                 AM-PAC OT "6 Clicks" Daily Activity     Outcome Measure   Help from another person eating meals?: None Help from another person taking care of personal grooming?: None Help from another person toileting, which includes using toliet, bedpan, or urinal?: A Little Help from another person bathing (including washing, rinsing, drying)?: A Little Help from another person to put on and taking off regular upper body clothing?: A Little Help from another person to put on and taking off regular lower  body clothing?: A Little 6 Click Score: 20    End of Session Equipment Utilized During Treatment: Oxygen;Gait belt  OT Visit Diagnosis: Unsteadiness on feet (R26.81);Muscle weakness (generalized) (M62.81)   Activity Tolerance Patient tolerated treatment well   Patient Left with call bell/phone within reach;in chair;with family/visitor present   Nurse Communication          Time: 1610-9604 OT Time Calculation (min): 34 min  Charges: OT Treatments $Self Care/Home Management : 8-22 mins $Therapeutic Activity: 8-22 mins     Britt Bottom 10/23/2018, 12:53 PM

## 2018-10-23 NOTE — Progress Notes (Signed)
RN rounded on pt. Pt states she does not need anything at this time. 

## 2018-10-23 NOTE — Care Management Important Message (Signed)
Important Message  Patient Details  Name: [name] [name] MRN: [id number] Date of Birth: July 05, 1951   Medicare Important Message Given:  Yes    Val Schiavo P Jayleen Afonso 10/23/2018, 4:07 PM

## 2018-10-23 NOTE — Progress Notes (Signed)
PROGRESS NOTE  [name]  [facility]:[id number] DOB: 04-04-1951 DOA: 10/20/2018 PCP: [name], MD  Outpatient Specialists: Cardiology, Dr. Gwenlyn Found  Brief Narrative: [name] [name] is a 68 y.o. female with a history of morbid obesity, OSA, HTN, HLD, T2DM, stage III CKD who presented to the ED at the insistence of her husband for shortness of breath for several weeks limiting her already limited mobility. She had orthopnea, leg swelling, and PND and stopped exerting herself due to dyspnea, and has also had increasing cough. Evaluation in ED included elevated BNP, troponin 0.10, creatinine near baseline at 1.97. ECG is nonischemic. 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 -encourage to use incentive perimetry, flutter valve, continue with diuresis . -Is on oxygen, wean as tolerated   Acute on chronic systolic, diastolic CHF: -  Echo 6/62 shows EF 45-50%, elevated left sided filling pressures. RV systolic function ok.  - On 160 mg oral Lasix twice daily at home, with significant volume overload currently, on IV Lasix 160 IV twice daily, which has been held  given worsening renal function, her home dose metolazone has been held by her primary nephrologist as an outpatient . -CHF team input greatly appreciated, she will be started on Lasix drip, metolazone, renal function closely . -Was counseled about fluid restriction and low-salt diet today  Wheezing/bronchitis, tobacco use:  - Dulera, albuterol prn, he is on IV steroids, transition to prednisone, will continue with rapid taper - Cessation counseling discussed  AKI CKD stage IV  -Baseline creatinine around 2-2.4, it did increase to 2.99, will continue with diuresis, will monitor renal function closely, if worsening on diuresis, will then will consult renal.  Hyperkalemia -potassium of 5.7, resolved with Kayexalate, will monitor closely as she is on diuresis,  IDT2DM: HbA1c 7.2% - Continue basal-bolus insulin and correct with SSI. Decrease 70/30 mix because she will not be eating nearly as poorly here as at home. - Continue trulicity.  OSA:  - Needs outpatient evaluation  PAD:  - Continue plavix, statin  Morbid obesity: BMI 46. Discussed that this is a primary underlying factor in her future health. - Dietitian consult  Hypothyroidism:  - Continue synthroid  Incidental pulmonary nodule: Interval development of 11 mm nodule seen in left lower lobe. This is concerning for possible neoplasm or malignancy.  - Consider one of the following in 3 months for both low-risk and high-risk individuals: (a) repeat chest CT, (b) follow-up PET-CT, or (c) tissue sampling. -I have scheduled an appointment for her with pulmonary as an outpatient with Dr. Elsworth Soho on January 30 at 11:15 AM  PAD:  - Follow up with Dr. Oneida Alar  DVT prophylaxis: Weight-dosed lovenox Code Status: Full Family Communication: Husband  at bedside Disposition Plan: Uncertain.   Consultants:   Cardiology to evaluate 1/15  Procedures:   Echocardiogram 10/21/2018: - Left ventricle: The cavity size was mildly dilated. Wall    thickness was normal. Systolic function was mildly reduced. The   estimated ejection fraction was in the range of 45% to 50%.   Diffuse hypokinesis. Doppler parameters are consistent with both   elevated ventricular end-diastolic filling pressure and elevated   left atrial filling pressure. - Left atrium: The atrium was moderately dilated.  Antimicrobials:  None     Objective: Vitals:   10/23/18 0435 10/23/18 0737 10/23/18 0816 10/23/18 1000  BP: 140/84  (!) 146/82 (!) 148/82  Pulse: (!) 103  96 94  Resp: 20     Temp: 98.6 F (37 C)     TempSrc: Oral     SpO2: 97% 94%    Weight: 125.6 kg     Height:        Intake/Output Summary (Last 24 hours) at 10/23/2018 1313 Last data filed at 10/23/2018 1029 Gross per 24 hour  Intake 483 ml  Output 900 ml  Net -417 ml   Filed Weights   10/21/18 0325 10/22/18 0536 10/23/18 0435  Weight: 124.1 kg 123.5 kg 125.6 kg    Physical exam:  Awake Alert, Oriented X 3, No new F.N deficits, Normal affect Symmetrical Chest wall movement, Minister entry at the bases, no wheezing RRR,No Gallops,Rubs or new Murmurs, No Parasternal Heave +ve B.Sounds, Abd Soft, No tenderness, No rebound - guarding or rigidity. No Cyanosis, Clubbing, +2 edema, No new Rash or bruise     Data Reviewed: I have personally reviewed following labs and imaging studies  CBC: Recent Labs  Lab 10/20/18 0853 10/20/18 1342  WBC 13.8* 13.6*  NEUTROABS 11.3*  --   HGB 11.2* 11.3*  HCT 37.2 37.1  MCV 85.1 86.9  PLT 356 093   Basic Metabolic Panel: Recent Labs  Lab 10/20/18 0853 10/20/18 1342 10/21/18 0442 10/22/18 0453 10/23/18 0555  NA 141  --  140 139 141  K 3.5  --  4.8 5.7* 4.3  CL 101  --  100 95* 95*  CO2 28  --  31 34* 35*  GLUCOSE 154*  --  191* 202* 121*  BUN 55*  --  62* 73* 84*  CREATININE 1.97* 2.07* 2.36* 2.99* 2.72*  CALCIUM 10.1  --  10.0 10.5* 10.3  MG  --   --   --   --  2.8*   GFR: Estimated Creatinine Clearance: 26 mL/min (A) (by  C-G formula based on SCr of 2.72 mg/dL (H)). Liver Function Tests: Recent Labs  Lab 10/20/18 0853  AST 33  ALT 20  ALKPHOS 39  BILITOT 0.8  PROT 7.0  ALBUMIN 3.6   No results for input(s): LIPASE, AMYLASE in the last 168 hours. No results for input(s): AMMONIA in the last 168 hours. Coagulation Profile: Recent Labs  Lab 10/20/18 0853  INR 0.96   Cardiac Enzymes: No results for input(s): CKTOTAL, CKMB, CKMBINDEX, TROPONINI in the last 168 hours. BNP (last 3 results) No results for input(s): PROBNP in the last 8760 hours. HbA1C: Recent Labs    10/20/18 1607  HGBA1C 7.2*   CBG: Recent Labs  Lab 10/22/18 1138 10/22/18 1651 10/22/18 2113 10/23/18 0805 10/23/18 1149  GLUCAP 181* 234* 248* 144* 212*   Lipid Profile: No results for input(s): CHOL, HDL, LDLCALC, TRIG, CHOLHDL, LDLDIRECT in the last 72 hours. Thyroid Function Tests: No results  for input(s): TSH, T4TOTAL, FREET4, T3FREE, THYROIDAB in the last 72 hours. Anemia Panel: No results for input(s): VITAMINB12, FOLATE, FERRITIN, TIBC, IRON, RETICCTPCT in the last 72 hours. Urine analysis:    Component Value Date/Time   COLORURINE YELLOW 10/20/2018 0853   APPEARANCEUR HAZY (A) 10/20/2018 0853   LABSPEC 1.011 10/20/2018 0853   PHURINE 5.0 10/20/2018 [place] 10/20/2018 0853   HGBUR NEGATIVE 10/20/2018 0853   [place] 10/20/2018 0853   [place] 10/20/2018 0853   PROTEINUR 30 (A) 10/20/2018 0853   UROBILINOGEN 0.2 08/04/2014 1409   NITRITE NEGATIVE 10/20/2018 0853   LEUKOCYTESUR NEGATIVE 10/20/2018 0853   No results found for this or any previous visit (from the past 240 hour(s)).    Radiology Studies: No results found.  Scheduled Meds: . allopurinol  100 mg Oral Daily  . calcium-vitamin D  1 tablet Oral Q breakfast  . carvedilol  3.125 mg Oral BID WC  . clopidogrel  75 mg Oral Daily  . Dulaglutide  1.5 mg Subcutaneous Q Mon  . fenofibrate  160 mg Oral Daily  .  heparin injection (subcutaneous)  5,000 Units Subcutaneous Q8H  . insulin aspart  0-5 Units Subcutaneous QHS  . insulin aspart  0-9 Units Subcutaneous TID WC  . insulin aspart protamine- aspart  30 Units Subcutaneous BID WC  . ipratropium-albuterol  3 mL Nebulization TID  . isosorbide-hydrALAZINE  1 tablet Oral TID  . levothyroxine  150 mcg Oral QAC breakfast  . mometasone-formoterol  2 puff Inhalation BID  . multivitamin with minerals  1 tablet Oral Daily  . pneumococcal 23 valent vaccine  0.5 mL Intramuscular Tomorrow-1000  . polyethylene glycol  34 g Oral Daily  . predniSONE  30 mg Oral Q breakfast  . simvastatin  20 mg Oral QHS  . sodium chloride flush  3 mL Intravenous Q12H  . tiZANidine  4 mg Oral BID   Continuous Infusions: . sodium chloride Stopped (10/22/18 0200)  . furosemide (LASIX) infusion 15 mg/hr (10/23/18 1157)     LOS: 3 days    Phillips Climes, MD [facility] [email or web address] Password [email or web address] 10/23/2018, 1:13 PM

## 2018-10-23 NOTE — Plan of Care (Signed)
  Problem: Education: Goal: Ability to demonstrate management of disease process will improve Outcome: Progressing   Problem: Activity: Goal: Capacity to carry out activities will improve Outcome: Progressing   Problem: Cardiac: Goal: Ability to achieve and maintain adequate cardiopulmonary perfusion will improve Outcome: Progressing   Problem: Education: Goal: Knowledge of disease or condition will improve Outcome: Progressing   Problem: Activity: Goal: Ability to tolerate increased activity will improve Outcome: Progressing   Problem: Respiratory: Goal: Ability to maintain a clear airway will improve Outcome: Progressing   Problem: Education: Goal: Knowledge of General Education information will improve Description Including pain rating scale, medication(s)/side effects and non-pharmacologic comfort measures Outcome: Progressing

## 2018-10-24 LAB — GLUCOSE, CAPILLARY
Glucose-Capillary: 120 mg/dL — ABNORMAL HIGH (ref 70–99)
Glucose-Capillary: 268 mg/dL — ABNORMAL HIGH (ref 70–99)
Glucose-Capillary: 179 mg/dL — ABNORMAL HIGH (ref 70–99)
Glucose-Capillary: 151 mg/dL — ABNORMAL HIGH (ref 70–99)

## 2018-10-24 LAB — BASIC METABOLIC PANEL
Anion gap: 8 (ref 5–15)
BUN: 90 mg/dL — ABNORMAL HIGH (ref 8–23)
CO2: 40 mmol/L — ABNORMAL HIGH (ref 22–32)
Calcium: 9.8 mg/dL (ref 8.9–10.3)
Chloride: 91 mmol/L — ABNORMAL LOW (ref 98–111)
Creatinine, Ser: 2.68 mg/dL — ABNORMAL HIGH (ref 0.44–1.00)
GFR calc Af Amer: 20 mL/min — ABNORMAL LOW (ref >60)
GFR calc non Af Amer: 18 mL/min — ABNORMAL LOW (ref >60)
Glucose, Bld: 157 mg/dL — ABNORMAL HIGH (ref 70–99)
Potassium: 3.8 mmol/L (ref 3.5–5.1)
Sodium: 139 mmol/L (ref 135–145)

## 2018-10-24 MED ORDER — METOLAZONE 5 MG PO TABS
5.0000 mg | ORAL_TABLET | Freq: Once | ORAL | Status: AC
  Administered 2018-10-24: 5 mg via ORAL
  Filled 2018-10-24: qty 1

## 2018-10-24 MED ORDER — IPRATROPIUM-ALBUTEROL 0.5-2.5 (3) MG/3ML IN SOLN
3.0000 mL | Freq: Two times a day (BID) | RESPIRATORY_TRACT | Status: DC
  Administered 2018-10-24 – 2018-10-30 (×12): 3 mL via RESPIRATORY_TRACT
  Filled 2018-10-24 (×11): qty 3

## 2018-10-24 MED ORDER — METOLAZONE 2.5 MG PO TABS: 2.5000 mg | ORAL_TABLET | Freq: Once | ORAL | Status: DC

## 2018-10-24 MED ORDER — ISOSORB DINITRATE-HYDRALAZINE 20-37.5 MG PO TABS: 0.5000 | ORAL_TABLET | Freq: Three times a day (TID) | ORAL | Status: DC

## 2018-10-24 NOTE — Progress Notes (Addendum)
Advanced Heart Failure Rounding Note  PCP-Cardiologist: No primary care provider on file.   Subjective:    Given 80 mg IV lasix + metolazone, then started on lasix drip at 15 mg/hr. I/O only negative 350 mls. Lots of PO intake. Weight down 2 lbs. Now on 1 L [place].   Creatinine improving 2.72 -> 2.68. K 3.8.   Started on bidil. No BP readings this morning, but 90-110s overnight.   SOB is better. Says she doesn't drink everything that's brought in.   Objective:   Weight Range: 124.5 kg Body mass index is 47.12 kg/m.   Vital Signs:   Temp:  [98 F (36.7 C)-98.8 F (37.1 C)] 98.8 F (37.1 C) (01/16 2136) Pulse Rate:  [90-94] 90 (01/16 2321) Resp:  [18-20] 20 (01/16 2136) BP: (94-148)/(60-82) 110/60 (01/16 2321) SpO2:  [86 %-99 %] 97 % (01/16 2324) Weight:  [124.5 kg] 124.5 kg (01/17 0500) Last BM Date: 10/20/18  Weight change: Filed Weights   10/22/18 0536 10/23/18 0435 10/24/18 0500  Weight: 123.5 kg 125.6 kg 124.5 kg    Intake/Output:   Intake/Output Summary (Last 24 hours) at 10/24/2018 0831 Last data filed at 10/24/2018 0345 Gross per 24 hour  Intake 1488.67 ml  Output 2200 ml  Net -711.33 ml      Physical Exam    General:  No resp difficulty HEENT: Normal Neck: Supple. JVP 10-12. Carotids 2+ bilat; no bruits. No lymphadenopathy or thyromegaly appreciated. Cor: PMI nondisplaced. Regular rate & rhythm. No rubs, gallops or murmurs. Lungs: crackles in LLL Abdomen: Soft, nontender, nondistended. No hepatosplenomegaly. No bruits or masses. Good bowel sounds. Extremities: No cyanosis, clubbing, rash, BLE 1+ edema.  Neuro: Alert & orientedx3, cranial nerves grossly intact. moves all 4 extremities w/o difficulty. Affect pleasant   Telemetry   NSR 90-100s. Personally reviewed.   EKG    No new tracings.   Labs    CBC No results for input(s): WBC, NEUTROABS, HGB, HCT, MCV, PLT in the last 72 hours. Basic Metabolic Panel Recent Labs    10/23/18 0555  10/24/18 0408  NA 141 139  K 4.3 3.8  CL 95* 91*  CO2 35* 40*  GLUCOSE 121* 157*  BUN 84* 90*  CREATININE 2.72* 2.68*  CALCIUM 10.3 9.8  MG 2.8*  --    Liver Function Tests No results for input(s): AST, ALT, ALKPHOS, BILITOT, PROT, ALBUMIN in the last 72 hours. No results for input(s): LIPASE, AMYLASE in the last 72 hours. Cardiac Enzymes No results for input(s): CKTOTAL, CKMB, CKMBINDEX, TROPONINI in the last 72 hours.  BNP: BNP (last 3 results) Recent Labs    10/20/18 0853  BNP 500.3*    ProBNP (last 3 results) No results for input(s): PROBNP in the last 8760 hours.   D-Dimer No results for input(s): DDIMER in the last 72 hours. Hemoglobin A1C No results for input(s): HGBA1C in the last 72 hours. Fasting Lipid Panel No results for input(s): CHOL, HDL, LDLCALC, TRIG, CHOLHDL, LDLDIRECT in the last 72 hours. Thyroid Function Tests No results for input(s): TSH, T4TOTAL, T3FREE, THYROIDAB in the last 72 hours.  Invalid input(s): FREET3  Other results:   Imaging     No results found.   Medications:     Scheduled Medications: . allopurinol  100 mg Oral Daily  . calcium-vitamin D  1 tablet Oral Q breakfast  . carvedilol  3.125 mg Oral BID WC  . clopidogrel  75 mg Oral Daily  . Dulaglutide  1.5  mg Subcutaneous Q Mon  . fenofibrate  160 mg Oral Daily  . heparin injection (subcutaneous)  5,000 Units Subcutaneous Q8H  . insulin aspart  0-5 Units Subcutaneous QHS  . insulin aspart  0-9 Units Subcutaneous TID WC  . insulin aspart protamine- aspart  30 Units Subcutaneous BID WC  . ipratropium-albuterol  3 mL Nebulization TID  . isosorbide-hydrALAZINE  1 tablet Oral TID  . levothyroxine  150 mcg Oral QAC breakfast  . mometasone-formoterol  2 puff Inhalation BID  . multivitamin with minerals  1 tablet Oral Daily  . pneumococcal 23 valent vaccine  0.5 mL Intramuscular Tomorrow-1000  . polyethylene glycol  34 g Oral Daily  . predniSONE  10 mg Oral Q breakfast    . simvastatin  20 mg Oral QHS  . sodium chloride flush  3 mL Intravenous Q12H  . tiZANidine  4 mg Oral BID     Infusions: . sodium chloride Stopped (10/22/18 0200)  . furosemide (LASIX) infusion 15 mg/hr (10/24/18 0314)     PRN Medications:  sodium chloride, acetaminophen, albuterol, calcium carbonate, fluticasone, hydrOXYzine, ondansetron (ZOFRAN) IV, oxyCODONE-acetaminophen **AND** oxyCODONE, polyvinyl alcohol, sodium chloride flush    Patient Profile   [name] [name] is a 68 y.o. female with a history of TIAs, tobacco use, HTN, DM, HL, PAD followed by Dr Oneida Alar, chronic diastolic HF, CKD IV followed by Dr Florene Glen, hypothyroidism, morbid obesity, and LBBB.  Admitted with volume overload despite high dose diuretics at home. Dr Gwenlyn Found requests that we assist with diuresis in setting of AKI.  Assessment/Plan   1. A/C combined (primary diastolic) HF - LHC 5277 with normal coronaries.  - Echo 1/15: EF 45-50%. Echo 04/2015: EF 60-65%, grade 1 DD. Echo 10/2018: EF 45-50%, diffuse HK, LA mildly dilated - Volume elevated, but responded okay to lasix drip. Complicated by CKD IV with AKI.  - Continue lasix drip @ 15 mg/hr. Repeat metolazone 2.5 mg today.  - Continue coreg 3.125 mg BID - SBP 90-110s. Decrease bidil to 1/2 tab TID. - Discussed limiting fluid intake. Will add restriction to her diet.   2. CKD IV with AKI - Unclear baseline. No labs in epic since 04/2017, but creatinine was 2.44 at that time. - Followed with Dr Florene Glen outpatient.  - Creatinine 2.72 -> 2.68 - May need to consult renal. She wants to avoid HD.   3. Acute hypoxic respiratory failure - On RA this morning. Does not wear any oxygen at home.  - On prednisone and PRN nebs.  - No PFTs on file.   4. Tobacco use - Continues to smoke. 1 pack lasts her 3-4 days. Discussed cessation.   5. Obesity - Needs to lose weight.   6. Hyperkalemia - K 3.8 today.   7. ?OSA - Needs sleep study. No change.    8. Hx of TIA - Continue plavix and simvastatin. No change.   Medication concerns reviewed with patient and pharmacy team. Barriers identified: none at the time.    Length of Stay: [place], NP  10/24/2018, 8:31 AM  Advanced Heart Failure Team Pager [phone] (M-F; 7a - 4p)  Please contact [facility] for night-coverage after hours (4p -7a ) and weekends on [email or web address]  Patient seen with NP, agree with the above note.   Did better on walk with PT today, was able to walk without oxygen.   On exam, she remains volume overloaded with JVP 14 cm and 1+ edema to knees.  Regular S1S2. Mild  crackles at bases.   1. Acute on chronic primarily diastolic CHF: Echo with EF 45-50% in 1/20. CHF is complicated by CKD stage IV at baseline, now with AKI in hospital.  She diuresed some yesterday, still volume overloaded.  BUN/creatinine elevated but stable.    - Continue Coreg.  - Stop Bidil for now, avoid low BP.   - Continue Lasix 15 mg/hr with metolazone 5 mg x 1 today.  Hopefully, if we can effect some diuresis, renal venous pressure will lower and creatinine will improve.   2. AKI on CKD stage IV: Patient is volume overloaded but creatinine is elevated.  Difficult situation.  Will continue to attempt diuresis as above.  If creatinine continues to rise, may need input from nephrology.  She likely would be a dialysis candidate if necessary.  BUN/creatinine stable today.  3. Smoking/suspected COPD:  I urged her to quit smoking.  Currently on prednisone for ?component of COPD exacerbation.  4. H/o TIA: On Plavix + statin.   Loralie Champagne 10/24/2018 8:52 AM

## 2018-10-24 NOTE — Progress Notes (Signed)
PROGRESS NOTE  ZAIYAH SOTTILE  CVE:[id number] DOB: 1950-10-23 DOA: 10/20/2018 PCP: [name], MD  Outpatient Specialists: Cardiology, Dr. Gwenlyn Found  Brief Narrative: Karmella Bouvier Cisek is a 68 y.o. female with a history of morbid obesity, OSA, HTN, HLD, T2DM, stage III CKD who presented to the ED at the insistence of her husband for shortness of breath for several weeks limiting her already limited mobility. She had orthopnea, leg swelling, and PND and stopped exerting herself due to dyspnea, and has also had increasing cough. Evaluation in ED included elevated BNP, troponin 0.10, creatinine near baseline at 1.97. ECG is nonischemic. 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 -encourage to use incentive perimetry, flutter valve, continue with diuresis . -Wean down as tolerated, down from 3 L to 1 L today  Acute on chronic systolic/diastolic CHF -  Echo 0/25 shows EF 45-50%, elevated left sided filling pressures. RV systolic function ok.  -She is on large dose Lasix at home 160 mg oral twice daily, on 160 mg oral Lasix twice daily at home, diuresis has been  difficult initially giving worsening renal function, and CKD stage IV, CHF team input greatly appreciated, currently diuresis per CHF team, she received IV Lasix yesterday, currently on Lasix drip 15 mg/h, and dosed with metolazone on as needed, renal function remained stable, diuresing well, . -Started on Coreg 3.125 mg oral twice daily  -Pressure remains soft, no room to add BiDil,  -As well has ACE allergy  -Was counseled about fluid restriction and low-salt diet today  Wheezing/bronchitis, tobacco use:  - Dulera, albuterol prn, he is on IV steroids, currently steroids has been tapered- Cessation counseling discussed  AKI CKD stage IV  -Baseline creatinine around 2-2.4, it did increase to 2.99, will continue with diuresis, will monitor renal function closely, if worsening on diuresis, will then will consult renal.  Hyperkalemia -potassium of 5.7, resolved with Kayexalate, will monitor closely as she is on diuresis,  IDT2DM: HbA1c 7.2% - Continue basal-bolus insulin and correct with SSI. Decrease 70/30 mix because she will not be eating nearly as poorly here as at home. - Continue trulicity.  OSA:  - Needs outpatient evaluation  PAD:  - Continue plavix, statin  Morbid obesity: BMI 46. Discussed that this is a primary underlying factor in her future health. - Dietitian consult  Hypothyroidism:  - Continue synthroid  Incidental pulmonary nodule: Interval development of 11 mm nodule seen in left lower lobe. This is concerning for possible neoplasm or malignancy.  - Consider one of the following in 3 months for both low-risk and high-risk individuals: (a) repeat chest CT, (b) follow-up PET-CT, or (c) tissue sampling. -I have scheduled an appointment for her with pulmonary as  an outpatient with Dr. Elsworth Soho on January 30 at 11:15 AM  Tobacco abuse -Was counseled  DVT prophylaxis: Weight-dosed lovenox Code Status: Full Family Communication: None at bedside Disposition Plan: Home when  stable  Consultants:   Cardiology to evaluate 1/15  Procedures:   Echocardiogram 10/21/2018: - Left ventricle: The cavity size was mildly dilated. Wall   thickness was normal. Systolic function was mildly reduced. The   estimated ejection fraction was in the range of 45% to 50%.   Diffuse hypokinesis. Doppler parameters are consistent with both   elevated ventricular end-diastolic filling pressure and elevated   left atrial filling pressure. - Left atrium: The atrium was moderately dilated.  Antimicrobials:  None     Objective: Vitals:   10/23/18 2322 10/23/18 2324 10/24/18 0500 10/24/18 0858  BP:      Pulse:    90  Resp:    18  Temp:      TempSrc:      SpO2: 97% 97%    Weight:   124.5 kg   Height:        Intake/Output Summary (Last 24 hours) at 10/24/2018 1122 Last data filed at 10/24/2018 0345 Gross per 24 hour  Intake 1485.67 ml  Output 2200 ml  Net -714.33 ml   Filed Weights   10/22/18 0536 10/23/18 0435 10/24/18 0500  Weight: 123.5 kg 125.6 kg 124.5 kg    Physical exam:  Awake Alert, Oriented X 3, No new F.N deficits, Normal affect Symmetrical Chest wall movement, Good air movement bilaterally, diminished air entry at the bases, no wheezing RRR,No Gallops,Rubs or new Murmurs, No Parasternal Heave +ve B.Sounds, Abd Soft, No tenderness, No rebound - guarding or rigidity. No Cyanosis, Clubbing, +2 edema, No new Rash or bruise     Data Reviewed: I have personally reviewed following labs and imaging studies  CBC: Recent Labs  Lab 10/20/18 0853 10/20/18 1342  WBC 13.8* 13.6*  NEUTROABS 11.3*  --   HGB 11.2* 11.3*  HCT 37.2 37.1  MCV 85.1 86.9  PLT 356 903   Basic Metabolic Panel: Recent Labs  Lab 10/20/18 0853 10/20/18 1342 10/21/18 0442 10/22/18 0453 10/23/18 0555 10/24/18 0408  NA 141  --  140 139 141 139  K 3.5  --  4.8 5.7* 4.3 3.8  CL 101  --  100 95* 95* 91*  CO2 28  --  31 34* 35* 40*  GLUCOSE 154*  --  191* 202* 121* 157*  BUN  55*  --  62* 73* 84* 90*  CREATININE 1.97* 2.07* 2.36* 2.99* 2.72* 2.68*  CALCIUM 10.1  --  10.0 10.5* 10.3 9.8  MG  --   --   --   --  2.8*  --    GFR: Estimated Creatinine Clearance: 26.2 mL/min (A) (by C-G formula based on SCr of 2.68 mg/dL (H)). Liver Function Tests: Recent Labs  Lab 10/20/18 0853  AST 33  ALT 20  ALKPHOS 39  BILITOT 0.8  PROT 7.0  ALBUMIN 3.6   No results for input(s): LIPASE, AMYLASE in the last 168 hours. No results for input(s): AMMONIA in the last 168 hours. Coagulation Profile: Recent Labs  Lab 10/20/18 0853  INR 0.96   Cardiac Enzymes: No results for input(s): CKTOTAL, CKMB, CKMBINDEX, TROPONINI in the last 168 hours. BNP (last 3 results) No results for input(s): PROBNP in the last 8760 hours. HbA1C: No results for input(s): HGBA1C in the last 72 hours. CBG: Recent Labs  Lab 10/23/18 0805 10/23/18  1149 10/23/18 1630 10/23/18 2213 10/24/18 0734  GLUCAP 144* 212* 260* 165* 120*   Lipid Profile: No results for input(s): CHOL, HDL, LDLCALC, TRIG, CHOLHDL, LDLDIRECT in the last 72 hours. Thyroid Function Tests: No results for input(s): TSH, T4TOTAL, FREET4, T3FREE, THYROIDAB in the last 72 hours. Anemia Panel: No results for input(s): VITAMINB12, FOLATE, FERRITIN, TIBC, IRON, RETICCTPCT in the last 72 hours. Urine analysis:    Component Value Date/Time   COLORURINE YELLOW 10/20/2018 0853   APPEARANCEUR HAZY (A) 10/20/2018 0853   LABSPEC 1.011 10/20/2018 0853   PHURINE 5.0 10/20/2018 [place] 10/20/2018 0853   HGBUR NEGATIVE 10/20/2018 0853   [place] 10/20/2018 0853   [place] 10/20/2018 0853   PROTEINUR 30 (A) 10/20/2018 0853   UROBILINOGEN 0.2 08/04/2014 1409   NITRITE NEGATIVE 10/20/2018 0853   LEUKOCYTESUR NEGATIVE 10/20/2018 0853   No results found for this or any previous visit (from the past 240 hour(s)).    Radiology Studies: No results found.  Scheduled Meds: . allopurinol  100  mg Oral Daily  . calcium-vitamin D  1 tablet Oral Q breakfast  . carvedilol  3.125 mg Oral BID WC  . clopidogrel  75 mg Oral Daily  . Dulaglutide  1.5 mg Subcutaneous Q Mon  . fenofibrate  160 mg Oral Daily  . heparin injection (subcutaneous)  5,000 Units Subcutaneous Q8H  . insulin aspart  0-5 Units Subcutaneous QHS  . insulin aspart  0-9 Units Subcutaneous TID WC  . insulin aspart protamine- aspart  30 Units Subcutaneous BID WC  . ipratropium-albuterol  3 mL Nebulization TID  . levothyroxine  150 mcg Oral QAC breakfast  . metolazone  5 mg Oral Once  . mometasone-formoterol  2 puff Inhalation BID  . multivitamin with minerals  1 tablet Oral Daily  . pneumococcal 23 valent vaccine  0.5 mL Intramuscular Tomorrow-1000  . polyethylene glycol  34 g Oral Daily  . predniSONE  10 mg Oral Q breakfast  . simvastatin  20 mg Oral QHS  . sodium chloride flush  3 mL Intravenous Q12H  . tiZANidine  4 mg Oral BID   Continuous Infusions: . sodium chloride Stopped (10/22/18 0200)  . furosemide (LASIX) infusion 15 mg/hr (10/24/18 0314)     LOS: 4 days    Phillips Climes, MD [facility] [email or web address] Password [email or web address] 10/24/2018, 11:22 AM

## 2018-10-24 NOTE — Progress Notes (Signed)
Physical Therapy Treatment Patient Details Name: [name] [name] MRN: [id number] DOB: [birth year] Today's Date: 10/24/2018    History of Present Illness Pt is a 68 y.o. female admitted 10/20/18 with worsening SOB. Worked up for CHF exacerbation. 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 PMH includes CHF, CKD III, HTN, DM2, obesity, OSA.   PT Comments    Pt progressing with mobility. Ambulatory with rollator at supervision-level, improved gait mechanics this session; pt reports increased fatigue, although breathing noted to be improved. SpO2 >/89% on RA with ambulation; 96% on RA at rest. Performed 10x sit<>stand progressing to no UE support. Encouraged pt to continue ambulating at least 2-3x/day during hospital admission. Will follow acutely.    Follow Up Recommendations  Home health PT;Supervision - Intermittent     Equipment Recommendations  None recommended by PT    Recommendations for Other Services       Precautions / Restrictions Precautions Precautions: Fall Restrictions Weight Bearing Restrictions: No    Mobility  Bed Mobility Overal bed mobility: Independent             General bed mobility comments: Momentum to power into sitting  Transfers Overall transfer level: Modified independent Equipment used: 4-wheeled walker Transfers: Sit to/from Stand           General transfer comment: Performed 10x sit<>stand mod indep with and without UE support  Ambulation/Gait Ambulation/Gait assistance: Supervision Gait Distance (Feet): 120 Feet Assistive device: 4-wheeled walker Gait Pattern/deviations: Step-through pattern;Decreased stride length;Trunk flexed Gait velocity: Decreased Gait velocity interpretation: 1.31 - 2.62 ft/sec, indicative of limited community ambulator General Gait Details: More slow, controlled gait with rollator this session; supervision for safety. Pt reports increased fatigue. SpO2 >/89% on RA    Stairs              Wheelchair Mobility    Modified Rankin (Stroke Patients Only)       Balance Overall balance assessment: Needs assistance Sitting-balance support: Feet supported Sitting balance-Leahy Scale: Good     Standing balance support: Bilateral upper extremity supported;During functional activity Standing balance-Leahy Scale: Fair Standing balance comment: Can static stand without UE support; dynamic stability improved with at least single UE support                            Cognition Arousal/Alertness: Awake/alert Behavior During Therapy: WFL for tasks assessed/performed Overall Cognitive Status: Within Functional Limits for tasks assessed                                 General Comments: [facility] for simple tasks. Some decreased attention and poor insight into deficits, likely baseline cognition      Exercises Other Exercises Other Exercises: 10x sit<>stand, progressing to no UE support    General Comments General comments (skin integrity, edema, etc.): Educ on importance of walking at least 2-3x/day while admitted, with supervision/assist from staff for IV pole      Pertinent Vitals/Pain Pain Assessment: Faces Faces Pain Scale: Hurts a little bit Pain Location: R knee Pain Descriptors / Indicators: Discomfort Pain Intervention(s): Monitored during session    Home Living                      Prior Function            PT Goals (current goals can now be found in the care  plan section) Acute Rehab PT Goals Patient Stated Goal: Return home without having to wear oxygen PT Goal Formulation: With patient Time For Goal Achievement: 11/05/18 Potential to Achieve Goals: Good Progress towards PT goals: Progressing toward goals    Frequency    Min 3X/week      PT Plan Current plan remains appropriate    Co-evaluation              AM-PAC PT "6 Clicks" Mobility   Outcome Measure  Help needed turning from your back to  your side while in a flat bed without using bedrails?: None Help needed moving from lying on your back to sitting on the side of a flat bed without using bedrails?: None Help needed moving to and from a bed to a chair (including a wheelchair)?: None Help needed standing up from a chair using your arms (e.g., wheelchair or bedside chair)?: A Little Help needed to walk in hospital room?: A Little Help needed climbing 3-5 steps with a railing? : A Lot 6 Click Score: 20    End of Session   Activity Tolerance: Patient tolerated treatment well Patient left: in chair;with call bell/phone within reach Nurse Communication: Mobility status PT Visit Diagnosis: Other abnormalities of gait and mobility (R26.89)     Time: 8478-4128 PT Time Calculation (min) (ACUTE ONLY): 24 min  Charges:  $Gait Training: 8-22 mins $Therapeutic Exercise: 8-22 mins                    Mabeline Caras, PT, DPT Acute Rehabilitation Services  Pager [phone] Office [place] 10/24/2018, 9:41 AM

## 2018-10-25 LAB — GLUCOSE, CAPILLARY
Glucose-Capillary: 110 mg/dL — ABNORMAL HIGH (ref 70–99)
Glucose-Capillary: 91 mg/dL (ref 70–99)
Glucose-Capillary: 161 mg/dL — ABNORMAL HIGH (ref 70–99)
Glucose-Capillary: 107 mg/dL — ABNORMAL HIGH (ref 70–99)

## 2018-10-25 LAB — BASIC METABOLIC PANEL
Anion gap: 12 (ref 5–15)
BUN: 95 mg/dL — ABNORMAL HIGH (ref 8–23)
CO2: 42 mmol/L — ABNORMAL HIGH (ref 22–32)
Calcium: 10.4 mg/dL — ABNORMAL HIGH (ref 8.9–10.3)
Chloride: 85 mmol/L — ABNORMAL LOW (ref 98–111)
Creatinine, Ser: 2.41 mg/dL — ABNORMAL HIGH (ref 0.44–1.00)
GFR calc Af Amer: 23 mL/min — ABNORMAL LOW (ref >60)
GFR calc non Af Amer: 20 mL/min — ABNORMAL LOW (ref >60)
Glucose, Bld: 93 mg/dL (ref 70–99)
Potassium: 3.8 mmol/L (ref 3.5–5.1)
Sodium: 139 mmol/L (ref 135–145)

## 2018-10-25 MED ORDER — POTASSIUM CHLORIDE CRYS ER 10 MEQ PO TBCR
20.0000 meq | EXTENDED_RELEASE_TABLET | Freq: Every day | ORAL | Status: DC
  Administered 2018-10-25 – 2018-10-28 (×4): 20 meq via ORAL
  Filled 2018-10-25 (×6): qty 2

## 2018-10-25 MED ORDER — ACETAZOLAMIDE 250 MG PO TABS
250.0000 mg | ORAL_TABLET | Freq: Two times a day (BID) | ORAL | Status: DC
  Administered 2018-10-25 – 2018-10-26 (×4): 250 mg via ORAL
  Filled 2018-10-25 (×5): qty 1

## 2018-10-25 NOTE — Progress Notes (Signed)
CN responded to room, bed alarm went off  Pt states she has not fallen in the last 6 months and wants the bed alarm turned off  Primary RN aware and understanding  Bed alarm turned off Pt educated on calling for assistance due to IV pump  Pt verbalizes understanding  Family at bedside  Will continue to monitor

## 2018-10-25 NOTE — Progress Notes (Addendum)
Advanced Heart Failure Rounding Note  PCP-Cardiologist: No primary care provider on file.   Subjective:    Yesterday switch to lasix gtt at 15 and metolazone added. Diuresis improved. Weight down 5 pounds. Still with several cups of water on her bedside table but says she is cutting back. Breathing better but still with orthopnea. Not ambulating much. Legs sore and having hard time with TED hose.   Objective:   Weight Range: 122.1 kg Body mass index is 46.21 kg/m.   Vital Signs:   Temp:  [98 F (36.7 C)-99 F (37.2 C)] 98 F (36.7 C) (01/18 0602) Pulse Rate:  [84-98] 86 (01/18 0903) Resp:  [18] 18 (01/18 0602) BP: (132-140)/(63-94) 138/63 (01/18 0903) SpO2:  [91 %-97 %] 96 % (01/18 0828) FiO2 (%):  [94 %] 94 % (01/17 1945) Weight:  [122.1 kg] 122.1 kg (01/18 0602) Last BM Date: 10/25/18  Weight change: Filed Weights   10/23/18 0435 10/24/18 0500 10/25/18 0602  Weight: 125.6 kg 124.5 kg 122.1 kg    Intake/Output:   Intake/Output Summary (Last 24 hours) at 10/25/2018 1057 Last data filed at 10/25/2018 0937 Gross per 24 hour  Intake 942 ml  Output 6800 ml  Net -5858 ml      Physical Exam    General:  Obese woman lying in bed NAD HEENT: normal Neck: supple. JVP hard to see but appears ~ 10. Carotids 2+ bilat; no bruits. No lymphadenopathy or thryomegaly appreciated. Cor: PMI nondisplaced. Regular rate & rhythm. 2/6 TR Lungs: clear Abdomen: obese soft, nontender, nondistended. No hepatosplenomegaly. No bruits or masses. Good bowel sounds. Extremities: no cyanosis, clubbing, rash, 1-2+ edema Neuro: alert & orientedx3, cranial nerves grossly intact. moves all 4 extremities w/o difficulty. Affect pleasant  Telemetry   NSR 80-90s. Personally reviewed.   EKG    No new tracings.   Labs    CBC No results for input(s): WBC, NEUTROABS, HGB, HCT, MCV, PLT in the last 72 hours. Basic Metabolic Panel Recent Labs    10/23/18 0555 10/24/18 0408 10/25/18 0648    NA 141 139 139  K 4.3 3.8 3.8  CL 95* 91* 85*  CO2 35* 40* 42*  GLUCOSE 121* 157* 93  BUN 84* 90* 95*  CREATININE 2.72* 2.68* 2.41*  CALCIUM 10.3 9.8 10.4*  MG 2.8*  --   --    Liver Function Tests No results for input(s): AST, ALT, ALKPHOS, BILITOT, PROT, ALBUMIN in the last 72 hours. No results for input(s): LIPASE, AMYLASE in the last 72 hours. Cardiac Enzymes No results for input(s): CKTOTAL, CKMB, CKMBINDEX, TROPONINI in the last 72 hours.  BNP: BNP (last 3 results) Recent Labs    10/20/18 0853  BNP 500.3*    ProBNP (last 3 results) No results for input(s): PROBNP in the last 8760 hours.   D-Dimer No results for input(s): DDIMER in the last 72 hours. Hemoglobin A1C No results for input(s): HGBA1C in the last 72 hours. Fasting Lipid Panel No results for input(s): CHOL, HDL, LDLCALC, TRIG, CHOLHDL, LDLDIRECT in the last 72 hours. Thyroid Function Tests No results for input(s): TSH, T4TOTAL, T3FREE, THYROIDAB in the last 72 hours.  Invalid input(s): FREET3  Other results:   Imaging    No results found.   Medications:     Scheduled Medications: . allopurinol  100 mg Oral Daily  . calcium-vitamin D  1 tablet Oral Q breakfast  . carvedilol  3.125 mg Oral BID WC  . clopidogrel  75 mg Oral  Daily  . Dulaglutide  1.5 mg Subcutaneous Q Mon  . fenofibrate  160 mg Oral Daily  . heparin injection (subcutaneous)  5,000 Units Subcutaneous Q8H  . insulin aspart  0-5 Units Subcutaneous QHS  . insulin aspart  0-9 Units Subcutaneous TID WC  . insulin aspart protamine- aspart  30 Units Subcutaneous BID WC  . ipratropium-albuterol  3 mL Nebulization BID  . levothyroxine  150 mcg Oral QAC breakfast  . mometasone-formoterol  2 puff Inhalation BID  . multivitamin with minerals  1 tablet Oral Daily  . pneumococcal 23 valent vaccine  0.5 mL Intramuscular Tomorrow-1000  . polyethylene glycol  34 g Oral Daily  . simvastatin  20 mg Oral QHS  . sodium chloride flush  3 mL  Intravenous Q12H  . tiZANidine  4 mg Oral BID    Infusions: . sodium chloride Stopped (10/22/18 0200)  . furosemide (LASIX) infusion 15 mg/hr (10/24/18 2149)    PRN Medications: sodium chloride, acetaminophen, albuterol, calcium carbonate, fluticasone, hydrOXYzine, ondansetron (ZOFRAN) IV, oxyCODONE-acetaminophen **AND** oxyCODONE, polyvinyl alcohol, sodium chloride flush    Patient Profile   MIKAELAH TROSTLE is a 68 y.o. female with a history of TIAs, tobacco use, HTN, DM, HL, PAD followed by Dr Oneida Alar, chronic diastolic HF, CKD IV followed by Dr Florene Glen, hypothyroidism, morbid obesity, and LBBB.  Admitted with volume overload despite high dose diuretics at home. Dr Gwenlyn Found requests that we assist with diuresis in setting of AKI.  Assessment/Plan   1. A/C combined (primary diastolic) HF - LHC 4008 with normal coronaries.  - Echo 1/15: EF 45-50%. Echo 04/2015: EF 60-65%, grade 1 DD. Echo 10/2018: EF 45-50%, diffuse HK, LA mildly dilated - Now responding well to lasix drip @ 15 mg/hr + metolazone 2.5 mg today. Weight down 5 pounds overnight. Still volume overloaded.  - Creatinine improving with diuresis  - Bicarb up to 42. Add diamox 250 bid. Discussed dosing with PharmD personally. - Continue coreg 3.125 mg BID - Bidil decreased to 1/2 tab TID. BP improved - Discussed limiting fluid intake.  2. CKD IV with AKI - Unclear baseline. No labs in epic since 04/2017, but creatinine was 2.44 at that time. - Followed with Dr Florene Glen outpatient.  - Creatinine 2.72 -> 2.68 -> 2.41 - May need to consult renal. She wants to avoid HD.   3. Acute hypoxic respiratory failure - On RA this morning. Does not wear any oxygen at home.  - On prednisone and PRN nebs.  - No PFTs on file.   4. Tobacco use - Continues to smoke. 1 pack lasts her 3-4 days. Discussed cessation.   5. Obesity - Needs to lose weight.   6. Hyperkalemia - K 3.8 today. Will supp   7. ?OSA - Needs sleep study. No  change.   8. Hx of TIA - Continue plavix and simvastatin. No change.   Medication concerns reviewed with patient and pharmacy team. Barriers identified: none at the time.    Length of Stay: Rafael Hernandez, MD  10/25/2018, 10:57 AM  Advanced Heart Failure Team Pager [phone] (M-F; 7a - 4p)  Please contact [facility] for night-coverage after hours (4p -7a ) and weekends on [email or web address]

## 2018-10-25 NOTE — Progress Notes (Signed)
PROGRESS NOTE  [name]  BTD:[id number] DOB: 1951-09-18 DOA: 10/20/2018 PCP: [name], MD  Outpatient Specialists: Cardiology, Dr. Gwenlyn Found  Brief Narrative:  [name] [name] is a 68 y.o. female with a history of morbid obesity, OSA, HTN, HLD, T2DM, stage III CKD who presented to the ED at the insistence of [name] [name] husband for shortness of breath for several weeks limiting [name] [name] already limited mobility. [name] [name] had orthopnea, leg swelling, and PND and stopped exerting herself due to dyspnea, and has also had increasing cough. Evaluation in ED included elevated BNP, troponin 0.10, creatinine near baseline at 1.97. ECG is nonischemic. 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 -encourage to use incentive perimetry, flutter valve, continue with diuresis . -resolved , currently on room air  Acute on chronic systolic/diastolic CHF -  Echo 1/06 shows EF 45-50%, elevated left sided filling pressures. RV systolic function ok.  -[name] [name] is on large dose Lasix at home 160 mg oral twice daily, on 160 mg oral Lasix twice daily at home,  diuresis has been difficult initially giving worsening renal function, and CKD stage IV, CHF team input greatly appreciated, currently diuresis per CHF team, [name] [name] has been diuresing well on Lasix drip 15 mg/h, and metolazone as needed, [name] [name] weight is down by 5 pounds overnight, [name] [name] still with significant volume overload, so we will continue with current diuresis especially with fattening function improving with diuresis. -Monitor bicarb and chloride level closely. -Started on Coreg, and BiDil as well. -As well has ACE allergy  -Was counseled about fluid restriction and low-salt diet   Wheezing/bronchitis, tobacco use:  - Dulera, albuterol prn, [name] [name] is on IV steroids, currently steroids has been tapered- Cessation counseling discussed  AKI CKD stage IV  -Baseline creatinine around 2-2.4, it did increase to 2.99, will continue with diuresis, will monitor renal function closely, if worsening on diuresis, will then will consult renal.  Hyperkalemia -potassium of 5.7, resolved with Kayexalate, will monitor closely as [name] [name] is on diuresis,  IDT2DM: HbA1c 7.2% - Continue basal-bolus insulin and correct with SSI. Decrease 70/30 mix because [name] [name] will not be eating nearly as poorly here as at home. - Continue trulicity.  OSA:  - Needs outpatient evaluation  PAD:  - Continue plavix, statin  Morbid obesity: BMI 46. Discussed that this is a primary underlying factor in [name] [name] future health. - Dietitian consult  Hypothyroidism:  - Continue synthroid  Incidental pulmonary nodule: Interval development of 11 mm nodule seen in left lower lobe. This is concerning for possible neoplasm or malignancy.  - Consider one of the following in 3 months for both low-risk and high-risk individuals: (a) repeat chest CT, (b) follow-up PET-CT, or (  c) tissue sampling. -I have scheduled an appointment for [name] [name] with pulmonary as an outpatient with Dr. Elsworth Soho on January 30 at 11:15 AM  Tobacco abuse -Was counseled  DVT  prophylaxis: Weight-dosed lovenox Code Status: Full Family Communication: None at bedside Disposition Plan: Home when stable  Consultants:   Cardiology to evaluate 1/15  Procedures:   Echocardiogram 10/21/2018: - Left ventricle: The cavity size was mildly dilated. Wall   thickness was normal. Systolic function was mildly reduced. The   estimated ejection fraction was in the range of 45% to 50%.   Diffuse hypokinesis. Doppler parameters are consistent with both   elevated ventricular end-diastolic filling pressure and elevated   left atrial filling pressure. - Left atrium: The atrium was moderately dilated.  Antimicrobials:  None     Objective: Vitals:   10/24/18 1945 10/25/18 0602 10/25/18 0828 10/25/18 0903  BP: (!) 140/94 132/74  138/63  Pulse: 95 84  86  Resp: 18 18    Temp: 98.5 F (36.9 C) 98 F (36.7 C)    TempSrc: Oral Oral    SpO2: 91% 97% 96%   Weight:  122.1 kg    Height:        Intake/Output Summary (Last 24 hours) at 10/25/2018 1109 Last data filed at 10/25/2018 1751 Gross per 24 hour  Intake 942 ml  Output 6800 ml  Net -5858 ml   Filed Weights   10/23/18 0435 10/24/18 0500 10/25/18 0602  Weight: 125.6 kg 124.5 kg 122.1 kg    Physical exam:  Awake Alert, Oriented X 3, No new F.N deficits, Normal affect Symmetrical Chest wall movement, Good air movement bilaterally, CTAB RRR,No Gallops,Rubs or new Murmurs, No Parasternal Heave +ve B.Sounds, Abd Soft, No tenderness, No rebound - guarding or rigidity. No Cyanosis, Clubbing , +2 edema, No new Rash or bruise      Data Reviewed: I have personally reviewed following labs and imaging studies  CBC: Recent Labs  Lab 10/20/18 0853 10/20/18 1342  WBC 13.8* 13.6*  NEUTROABS 11.3*  --   HGB 11.2* 11.3*  HCT 37.2 37.1  MCV 85.1 86.9  PLT 356 025   Basic Metabolic Panel: Recent Labs  Lab 10/21/18 0442 10/22/18 0453 10/23/18 0555 10/24/18 0408 10/25/18 0648  NA 140 139 141 139 139  K 4.8  5.7* 4.3 3.8 3.8  CL 100 95* 95* 91* 85*  CO2 31 34* 35* 40* 42*  GLUCOSE 191* 202* 121* 157* 93  BUN 62* 73* 84* 90* 95*  CREATININE 2.36* 2.99* 2.72* 2.68* 2.41*  CALCIUM 10.0 10.5* 10.3 9.8 10.4*  MG  --   --  2.8*  --   --    GFR: Estimated Creatinine Clearance: 28.8 mL/min (A) (by C-G formula based on SCr of 2.41 mg/dL (H)). Liver Function Tests: Recent Labs  Lab 10/20/18 0853  AST 33  ALT 20  ALKPHOS 39  BILITOT 0.8  PROT 7.0  ALBUMIN 3.6   No results for input(s): LIPASE, AMYLASE in the last 168 hours. No results for input(s): AMMONIA in the last 168 hours. Coagulation Profile: Recent Labs  Lab 10/20/18 0853  INR 0.96   Cardiac Enzymes: No results for input(s): CKTOTAL, CKMB, CKMBINDEX, TROPONINI in the last 168 hours. BNP (last 3 results) No results for input(s): PROBNP in the last 8760 hours. HbA1C: No results for input(s): HGBA1C in the last 72 hours. CBG: Recent Labs  Lab 10/24/18 0734 10/24/18 1145 10/24/18 1631 10/24/18 2119 10/25/18 0836  GLUCAP 120* 268* 179*  151* 110*   Lipid Profile: No results for input(s): CHOL, HDL, LDLCALC, TRIG, CHOLHDL, LDLDIRECT in the last 72 hours. Thyroid Function Tests: No results for input(s): TSH, T4TOTAL, FREET4, T3FREE, THYROIDAB in the last 72 hours. Anemia Panel: No results for input(s): VITAMINB12, FOLATE, FERRITIN, TIBC, IRON, RETICCTPCT in the last 72 hours. Urine analysis:    Component Value Date/Time   COLORURINE YELLOW 10/20/2018 0853   APPEARANCEUR HAZY (A) 10/20/2018 0853   LABSPEC 1.011 10/20/2018 0853   PHURINE 5.0 10/20/2018 [place] 10/20/2018 0853   HGBUR NEGATIVE 10/20/2018 0853   [place] 10/20/2018 0853   [place] 10/20/2018 0853   PROTEINUR 30 (A) 10/20/2018 0853   UROBILINOGEN 0.2 08/04/2014 1409   NITRITE NEGATIVE 10/20/2018 0853   LEUKOCYTESUR NEGATIVE 10/20/2018 0853   No results found for this or any previous visit (from the past 240  hour(s)).    Radiology Studies: No results found.  Scheduled Meds: . allopurinol  100 mg Oral Daily  . calcium-vitamin D  1 tablet Oral Q breakfast  . carvedilol  3.125 mg Oral BID WC  . clopidogrel  75 mg Oral Daily  . Dulaglutide  1.5 mg Subcutaneous Q Mon  . fenofibrate  160 mg Oral Daily  . heparin injection (subcutaneous)  5,000 Units Subcutaneous Q8H  . insulin aspart  0-5 Units Subcutaneous QHS  . insulin aspart  0-9 Units Subcutaneous TID WC  . insulin aspart protamine- aspart  30 Units Subcutaneous BID WC  . ipratropium-albuterol  3 mL Nebulization BID  . levothyroxine  150 mcg Oral QAC breakfast  . mometasone-formoterol  2 puff Inhalation BID  . multivitamin with minerals  1 tablet Oral Daily  . pneumococcal 23 valent vaccine  0.5 mL Intramuscular Tomorrow-1000  . polyethylene glycol  34 g Oral Daily  . potassium chloride  20 mEq Oral Daily  . simvastatin  20 mg Oral QHS  . sodium chloride flush  3 mL Intravenous Q12H  . tiZANidine  4 mg Oral BID   Continuous Infusions: . sodium chloride Stopped (10/22/18 0200)  . furosemide (LASIX) infusion 15 mg/hr (10/24/18 2149)     LOS: 5 days    Phillips Climes, MD [facility] [email or web address] Password [email or web address] 10/25/2018, 11:09 AM

## 2018-10-26 LAB — GLUCOSE, CAPILLARY
Glucose-Capillary: 126 mg/dL — ABNORMAL HIGH (ref 70–99)
Glucose-Capillary: 138 mg/dL — ABNORMAL HIGH (ref 70–99)
Glucose-Capillary: 187 mg/dL — ABNORMAL HIGH (ref 70–99)
Glucose-Capillary: 164 mg/dL — ABNORMAL HIGH (ref 70–99)

## 2018-10-26 LAB — BASIC METABOLIC PANEL
Anion gap: 11 (ref 5–15)
BUN: 100 mg/dL — ABNORMAL HIGH (ref 8–23)
CO2: 43 mmol/L — ABNORMAL HIGH (ref 22–32)
Calcium: 10 mg/dL (ref 8.9–10.3)
Chloride: 85 mmol/L — ABNORMAL LOW (ref 98–111)
Creatinine, Ser: 2.93 mg/dL — ABNORMAL HIGH (ref 0.44–1.00)
GFR calc Af Amer: 18 mL/min — ABNORMAL LOW (ref >60)
GFR calc non Af Amer: 16 mL/min — ABNORMAL LOW (ref >60)
Glucose, Bld: 115 mg/dL — ABNORMAL HIGH (ref 70–99)
Potassium: 4 mmol/L (ref 3.5–5.1)
Sodium: 139 mmol/L (ref 135–145)

## 2018-10-26 LAB — CBC
HCT: 35.8 % — ABNORMAL LOW (ref 36.0–46.0)
Hemoglobin: 10.9 g/dL — ABNORMAL LOW (ref 12.0–15.0)
MCH: 26.3 pg (ref 26.0–34.0)
MCHC: 30.4 g/dL (ref 30.0–36.0)
MCV: 86.5 fL (ref 80.0–100.0)
Platelets: 366 10*3/uL (ref 150–400)
RBC: 4.14 MIL/uL (ref 3.87–5.11)
RDW: 16.2 % — ABNORMAL HIGH (ref 11.5–15.5)
WBC: 9.6 10*3/uL (ref 4.0–10.5)
nRBC: 0 % (ref 0.0–0.2)

## 2018-10-26 LAB — MAGNESIUM: Magnesium: 2.7 mg/dL — ABNORMAL HIGH (ref 1.7–2.4)

## 2018-10-26 NOTE — Progress Notes (Addendum)
Advanced Heart Failure Rounding Note  PCP-Cardiologist: No primary care provider on file.   Subjective:    Remains on lasix gtt at 15 and metolazone. Diamox added 1/18 for contraction alkalosis.   Weight down another 6 pounds. (13 pounds total). Says she is still SOB and feels her legs are swollen. No orthopnea or PND. Creatinine 2.4 -> 2.9   Objective:   Weight Range: 119.3 kg Body mass index is 45.16 kg/m.   Vital Signs:   Temp:  [98.2 F (36.8 C)-99.3 F (37.4 C)] 98.2 F (36.8 C) (01/19 0341) Pulse Rate:  [78-97] 85 (01/19 0923) Resp:  [18] 18 (01/19 0341) BP: (113-132)/(61-77) 124/77 (01/19 0923) SpO2:  [84 %-94 %] 92 % (01/19 1042) Weight:  [119.3 kg] 119.3 kg (01/19 0333) Last BM Date: 10/25/18  Weight change: Filed Weights   10/24/18 0500 10/25/18 0602 10/26/18 0333  Weight: 124.5 kg 122.1 kg 119.3 kg    Intake/Output:   Intake/Output Summary (Last 24 hours) at 10/26/2018 1124 Last data filed at 10/26/2018 0742 Gross per 24 hour  Intake 968.39 ml  Output 2400 ml  Net -1431.61 ml      Physical Exam    General:  Sitting in chair. No resp difficulty HEENT: normal Neck: supple. JVP 7-8  Carotids 2+ bilat; no bruits. No lymphadenopathy or thryomegaly appreciated. Cor: PMI nondisplaced. Regular rate & rhythm. No rubs, gallops or murmurs. Lungs: clear Abdomen: obese soft, nontender, nondistended. No hepatosplenomegaly. No bruits or masses. Good bowel sounds. Extremities: no cyanosis, clubbing, rash, tr-1+ edema (sore to palpation) TED on R none on left Neuro: alert & orientedx3, cranial nerves grossly intact. moves all 4 extremities w/o difficulty. Affect pleasant   Telemetry   NSR 80-90s. Personally reviewed.   EKG    No new tracings.   Labs    CBC Recent Labs    10/26/18 0450  WBC 9.6  HGB 10.9*  HCT 35.8*  MCV 86.5  PLT 536   Basic Metabolic Panel Recent Labs    10/25/18 0648 10/26/18 0450  NA 139 139  K 3.8 4.0  CL 85* 85*    CO2 42* 43*  GLUCOSE 93 115*  BUN 95* 100*  CREATININE 2.41* 2.93*  CALCIUM 10.4* 10.0  MG  --  2.7*   Liver Function Tests No results for input(s): AST, ALT, ALKPHOS, BILITOT, PROT, ALBUMIN in the last 72 hours. No results for input(s): LIPASE, AMYLASE in the last 72 hours. Cardiac Enzymes No results for input(s): CKTOTAL, CKMB, CKMBINDEX, TROPONINI in the last 72 hours.  BNP: BNP (last 3 results) Recent Labs    10/20/18 0853  BNP 500.3*    ProBNP (last 3 results) No results for input(s): PROBNP in the last 8760 hours.   D-Dimer No results for input(s): DDIMER in the last 72 hours. Hemoglobin A1C No results for input(s): HGBA1C in the last 72 hours. Fasting Lipid Panel No results for input(s): CHOL, HDL, LDLCALC, TRIG, CHOLHDL, LDLDIRECT in the last 72 hours. Thyroid Function Tests No results for input(s): TSH, T4TOTAL, T3FREE, THYROIDAB in the last 72 hours.  Invalid input(s): FREET3  Other results:   Imaging    No results found.   Medications:     Scheduled Medications: . acetaZOLAMIDE  250 mg Oral BID  . allopurinol  100 mg Oral Daily  . calcium-vitamin D  1 tablet Oral Q breakfast  . carvedilol  3.125 mg Oral BID WC  . clopidogrel  75 mg Oral Daily  . Dulaglutide  1.5 mg Subcutaneous Q Mon  . fenofibrate  160 mg Oral Daily  . heparin injection (subcutaneous)  5,000 Units Subcutaneous Q8H  . insulin aspart  0-5 Units Subcutaneous QHS  . insulin aspart  0-9 Units Subcutaneous TID WC  . insulin aspart protamine- aspart  30 Units Subcutaneous BID WC  . ipratropium-albuterol  3 mL Nebulization BID  . levothyroxine  150 mcg Oral QAC breakfast  . mometasone-formoterol  2 puff Inhalation BID  . multivitamin with minerals  1 tablet Oral Daily  . pneumococcal 23 valent vaccine  0.5 mL Intramuscular Tomorrow-1000  . polyethylene glycol  34 g Oral Daily  . potassium chloride  20 mEq Oral Daily  . simvastatin  20 mg Oral QHS  . sodium chloride flush  3 mL  Intravenous Q12H  . tiZANidine  4 mg Oral BID    Infusions: . sodium chloride Stopped (10/22/18 0200)  . furosemide (LASIX) infusion 15 mg/hr (10/26/18 0922)    PRN Medications: sodium chloride, acetaminophen, albuterol, calcium carbonate, fluticasone, hydrOXYzine, ondansetron (ZOFRAN) IV, oxyCODONE-acetaminophen **AND** oxyCODONE, polyvinyl alcohol, sodium chloride flush    Patient Profile   [name] [name] is a 68 y.o. female with a history of TIAs, tobacco use, HTN, DM, HL, PAD followed by Dr Oneida Alar, chronic diastolic HF, CKD IV followed by Dr Florene Glen, hypothyroidism, morbid obesity, and LBBB.  Admitted with volume overload despite high dose diuretics at home. Dr Gwenlyn Found requests that we assist with diuresis in setting of AKI.  Assessment/Plan   1. A/C combined (primary diastolic) HF - LHC 1607 with normal coronaries.  - Echo 1/15: EF 45-50%. Echo 04/2015: EF 60-65%, grade 1 DD. Echo 10/2018: EF 45-50%, diffuse HK, LA mildly dilated. RV normal  - Has responded well  to lasix drip @ 15 mg/hr + metolazone 2.5 mg today. Weight down 13 pounds total.  - Volume status improved and CVP doesn't seem to bad (hard to see on exam with body habitus). I looked at echo personally and RV normal . - She feels she is still volume overloaded but creatinine rising.   - Will stop diuretics for now.  Plan RHC tomorrow. Will make NPO. D/w Dr. Marvel Plan  - Bicarb up to 54. On diamox 250 bid. Will continue one more day. Should improve with holding diuretics - Continue coreg 3.125 mg BID - Bidil decreased to 1/2 tab TID. BP improved intake.  2. Acute on chronic renal failure - baseline CKD IV KI - Unclear baseline. No labs in epic since 04/2017, but creatinine was 2.44 at that time. - Followed with Dr Florene Glen outpatient.  - Creatinine 2.72 -> 2.68 -> 2.41 -> 2.93 - Will stop diuretics. RHC tomorrow  3. Acute hypoxic respiratory failure - On RA now. Does not wear any oxygen at home.  - On prednisone  and PRN nebs.  - No PFTs on file.   4. Tobacco use - Continues to smoke. 1 pack lasts her 3-4 days. Discussed cessation.   5. Obesity - Needs to lose weight.   6. Hyperkalemia - K 4.0 today. Will supp   7. ?OSA - Needs sleep study. No change.   8. Hx of TIA - Continue plavix and simvastatin. No change.   Medication concerns reviewed with patient and pharmacy team. Barriers identified: none at the time.    Length of Stay: [place], MD  10/26/2018, 11:24 AM  Advanced Heart Failure Team Pager [phone] (M-F; 7a - 4p)  Please contact [facility] for night-coverage after  hours (4p -7a ) and weekends on [email or web address]

## 2018-10-26 NOTE — Progress Notes (Signed)
PROGRESS NOTE  [name] [name]  NID:[id number] DOB: Nov 04, 1950 DOA: 10/20/2018 PCP: [name], MD  Outpatient Specialists: Cardiology, Dr. Gwenlyn Found  Brief Narrative:  [name] [name] is a 68 y.o. female with a history of morbid obesity, OSA, HTN, HLD, T2DM, stage III CKD who presented to the ED at the insistence of her husband for shortness of breath for several weeks limiting her already limited mobility. She had orthopnea, leg swelling, and PND and stopped exerting herself due to dyspnea, and has also had increasing cough. Evaluation in ED included elevated BNP, troponin 0.10, creatinine near baseline at 1.97. ECG is nonischemic. 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 -encourage to use incentive perimetry, flutter valve, continue with diuresis . -resolved , currently on room air  Acute on chronic systolic/diastolic CHF -  Echo 1/44 shows EF 45-50%, elevated left sided filling pressures. RV systolic function ok.  -She is on large dose Lasix at home 160 mg oral twice daily, on 160 mg oral Lasix twice daily at home, diuresis has been difficult initially giving worsening renal  function, and CKD stage IV, CHF team input greatly appreciated, currently diuresis per CHF team, he diuresed well on Lasix drip 15 mg/h, total 13 pounds since admission, renal function continued to worsen today, she does have contraction alkalosis as well, steroids has been held today, plan for right heart cath tomorrow. -Monitor bicarb and chloride level closely. -Started on Coreg, and BiDil as well. -As well has ACE allergy  -Was counseled about fluid restriction and low-salt diet   Wheezing/bronchitis, tobacco use:  - Dulera, albuterol prn, he is on IV steroids, currently steroids has been tapered- Cessation counseling discussed  AKI CKD stage IV  -Baseline creatinine around 2-2.4, it did increase to 2.99, initially improved with diuresis, but it is back to 2.9 today, diuresis are on hold . -Continue with Diamox today for traction alkalosis   Hyperkalemia -potassium of 5.7, resolved with Kayexalate, will monitor closely as she is on diuresis,  IDT2DM: HbA1c 7.2% - Continue basal-bolus insulin and correct with SSI. Decrease 70/30 mix because she will not be eating nearly as poorly here as at home. - Continue trulicity.  OSA:  - Needs outpatient evaluation  PAD:  - Continue plavix, statin  Morbid obesity: BMI 46. Discussed that this is a primary underlying factor in her future health. - Dietitian consult  Hypothyroidism:  - Continue synthroid  Incidental pulmonary nodule: Interval development of 11 mm nodule seen in left lower lobe. This is concerning for possible neoplasm or malignancy.  - Consider one of the following in 3 months for both low-risk and high-risk individuals: (a) repeat chest CT, (b) follow-up PET-CT, or (c) tissue sampling. -I have scheduled an appointment for  her with pulmonary as an outpatient with Dr. Elsworth Soho on January 30 at 11:15 AM, I have discussed at length with the patient  Tobacco abuse -Was counseled  DVT prophylaxis: Weight-dosed lovenox Code  Status: Full Family Communication: None at bedside Disposition Plan: Home when stable  Consultants:   Cardiology to evaluate 1/15  Procedures:   Echocardiogram 10/21/2018: - Left ventricle: The cavity size was mildly dilated. Wall   thickness was normal. Systolic function was mildly reduced. The   estimated ejection fraction was in the range of 45% to 50%.   Diffuse hypokinesis. Doppler parameters are consistent with both   elevated ventricular end-diastolic filling pressure and elevated   left atrial filling pressure. - Left atrium: The atrium was moderately dilated.  Antimicrobials:  None     Objective: Vitals:   10/26/18 0341 10/26/18 0923 10/26/18 1042 10/26/18 1206  BP: 123/61 124/77  127/71  Pulse: 78 85  82  Resp: 18   20  Temp: 98.2 F (36.8 C)   97.6 F (36.4 C)  TempSrc: Oral   Oral  SpO2: 92%  92% 96%  Weight:      Height:        Intake/Output Summary (Last 24 hours) at 10/26/2018 1233 Last data filed at 10/26/2018 1134 Gross per 24 hour  Intake 1208.39 ml  Output 3300 ml  Net -2091.61 ml   Filed Weights   10/24/18 0500 10/25/18 0602 10/26/18 0333  Weight: 124.5 kg 122.1 kg 119.3 kg    Physical exam:  Awake Alert, Oriented X 3, No new F.N deficits, Normal affect Symmetrical Chest wall movement, Good air movement bilaterally, CTAB RRR,No Gallops,Rubs or new Murmurs, No Parasternal Heave +ve B.Sounds, Abd Soft, No tenderness, No rebound - guarding or rigidity. No Cyanosis, Clubbing , +1 edema, No new Rash or bruise      Data Reviewed: I have personally reviewed following labs and imaging studies  CBC: Recent Labs  Lab 10/20/18 0853 10/20/18 1342 10/26/18 0450  WBC 13.8* 13.6* 9.6  NEUTROABS 11.3*  --   --   HGB 11.2* 11.3* 10.9*  HCT 37.2 37.1 35.8*  MCV 85.1 86.9 86.5  PLT 356 365 846   Basic Metabolic Panel: Recent Labs  Lab 10/22/18 0453 10/23/18 0555 10/24/18 0408 10/25/18 0648 10/26/18 0450  NA 139 141 139 139 139  K  5.7* 4.3 3.8 3.8 4.0  CL 95* 95* 91* 85* 85*  CO2 34* 35* 40* 42* 43*  GLUCOSE 202* 121* 157* 93 115*  BUN 73* 84* 90* 95* 100*  CREATININE 2.99* 2.72* 2.68* 2.41* 2.93*  CALCIUM 10.5* 10.3 9.8 10.4* 10.0  MG  --  2.8*  --   --  2.7*   GFR: Estimated Creatinine Clearance: 23.4 mL/min (A) (by C-G formula based on SCr of 2.93 mg/dL (H)). Liver Function Tests: Recent Labs  Lab 10/20/18 0853  AST 33  ALT 20  ALKPHOS 39  BILITOT 0.8  PROT 7.0  ALBUMIN 3.6   No results for input(s): LIPASE, AMYLASE in the last 168 hours. No results for input(s): AMMONIA in the last 168 hours. Coagulation Profile: Recent Labs  Lab 10/20/18 0853  INR 0.96   Cardiac Enzymes: No results for input(s): CKTOTAL, CKMB, CKMBINDEX, TROPONINI in the last 168 hours. BNP (last 3 results) No results for input(s): PROBNP in the last 8760 hours. HbA1C: No results for input(s): HGBA1C in the last 72 hours. CBG: Recent Labs  Lab 10/25/18 0836 10/25/18 1236 10/25/18 1645 10/25/18 2115 10/26/18 9629  GLUCAP 110* 91 161* 107* 126*   Lipid Profile: No results for input(s): CHOL, HDL, LDLCALC, TRIG, CHOLHDL, LDLDIRECT in the last 72 hours. Thyroid Function Tests: No results for input(s): TSH, T4TOTAL, FREET4, T3FREE, THYROIDAB in the last 72 hours. Anemia Panel: No results for input(s): VITAMINB12, FOLATE, FERRITIN, TIBC, IRON, RETICCTPCT in the last 72 hours. Urine analysis:    Component Value Date/Time   COLORURINE YELLOW 10/20/2018 0853   APPEARANCEUR HAZY (A) 10/20/2018 0853   LABSPEC 1.011 10/20/2018 0853   PHURINE 5.0 10/20/2018 [place] 10/20/2018 0853   HGBUR NEGATIVE 10/20/2018 0853   [place] 10/20/2018 0853   [place] 10/20/2018 0853   PROTEINUR 30 (A) 10/20/2018 0853   UROBILINOGEN 0.2 08/04/2014 1409   NITRITE NEGATIVE 10/20/2018 0853   LEUKOCYTESUR NEGATIVE 10/20/2018 0853   No results found for this or any previous visit (from the past 240  hour(s)).    Radiology Studies: No results found.  Scheduled Meds: . acetaZOLAMIDE  250 mg Oral BID  . allopurinol  100 mg Oral Daily  . calcium-vitamin D  1 tablet Oral Q breakfast  . carvedilol  3.125 mg Oral BID WC  . clopidogrel  75 mg Oral Daily  . Dulaglutide  1.5 mg Subcutaneous Q Mon  . fenofibrate  160 mg Oral Daily  . heparin injection (subcutaneous)  5,000 Units Subcutaneous Q8H  . insulin aspart  0-5 Units Subcutaneous QHS  . insulin aspart  0-9 Units Subcutaneous TID WC  . insulin aspart protamine- aspart  30 Units Subcutaneous BID WC  . ipratropium-albuterol  3 mL Nebulization BID  . levothyroxine  150 mcg Oral QAC breakfast  . mometasone-formoterol  2 puff Inhalation BID  . multivitamin with minerals  1 tablet Oral Daily  . pneumococcal 23 valent vaccine  0.5 mL Intramuscular Tomorrow-1000  . polyethylene glycol  34 g Oral Daily  . potassium chloride  20 mEq Oral Daily  . simvastatin  20 mg Oral QHS  . sodium chloride flush  3 mL Intravenous Q12H  . tiZANidine  4 mg Oral BID   Continuous Infusions: . sodium chloride Stopped (10/22/18 0200)     LOS: 6 days    Phillips Climes, MD [facility] [email or web address] Password [email or web address] 10/26/2018, 12:33 PM

## 2018-10-26 NOTE — Progress Notes (Signed)
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

## 2018-10-27 ENCOUNTER — Encounter (HOSPITAL_COMMUNITY): Payer: Self-pay | Admitting: Cardiology

## 2018-10-27 ENCOUNTER — Encounter (HOSPITAL_COMMUNITY)
Admission: EM | Disposition: A | Discharge status: Home / Self Care | Payer: Self-pay | Source: Home / Self Care | Attending: Internal Medicine

## 2018-10-27 DIAGNOSIS — I509 Heart failure, unspecified: Secondary | ICD-10-CM

## 2018-10-27 HISTORY — PX: RIGHT HEART CATH: CATH118263

## 2018-10-27 LAB — BASIC METABOLIC PANEL
Anion gap: 13 (ref 5–15)
BUN: 105 mg/dL — ABNORMAL HIGH (ref 8–23)
CO2: 41 mmol/L — ABNORMAL HIGH (ref 22–32)
Calcium: 10.1 mg/dL (ref 8.9–10.3)
Chloride: 86 mmol/L — ABNORMAL LOW (ref 98–111)
Creatinine, Ser: 3.02 mg/dL — ABNORMAL HIGH (ref 0.44–1.00)
GFR calc Af Amer: 18 mL/min — ABNORMAL LOW (ref >60)
GFR calc non Af Amer: 15 mL/min — ABNORMAL LOW (ref >60)
Glucose, Bld: 127 mg/dL — ABNORMAL HIGH (ref 70–99)
Potassium: 4.1 mmol/L (ref 3.5–5.1)
Sodium: 140 mmol/L (ref 135–145)

## 2018-10-27 LAB — POCT I-STAT 3, VENOUS BLOOD GAS (G3P V)
Acid-Base Excess: 19 mmol/L — ABNORMAL HIGH (ref 0.0–2.0)
Acid-Base Excess: 20 mmol/L — ABNORMAL HIGH (ref 0.0–2.0)
Bicarbonate: 48.3 mmol/L — ABNORMAL HIGH (ref 20.0–28.0)
Bicarbonate: 49.3 mmol/L — ABNORMAL HIGH (ref 20.0–28.0)
O2 Saturation: 61 %
O2 Saturation: 62 %
TCO2: >50 mmol/L — ABNORMAL HIGH (ref 22–32)
TCO2: >50 mmol/L — ABNORMAL HIGH (ref 22–32)
pCO2, Ven: 84 mmHg (ref 44.0–60.0)
pCO2, Ven: 85.7 mmHg (ref 44.0–60.0)
pH, Ven: 7.368 (ref 7.250–7.430)
pH, Ven: 7.368 (ref 7.250–7.430)
pO2, Ven: 35 mmHg (ref 32.0–45.0)
pO2, Ven: 36 mmHg (ref 32.0–45.0)

## 2018-10-27 LAB — GLUCOSE, CAPILLARY
Glucose-Capillary: 117 mg/dL — ABNORMAL HIGH (ref 70–99)
Glucose-Capillary: 195 mg/dL — ABNORMAL HIGH (ref 70–99)
Glucose-Capillary: 195 mg/dL — ABNORMAL HIGH (ref 70–99)
Glucose-Capillary: 195 mg/dL — ABNORMAL HIGH (ref 70–99)

## 2018-10-27 SURGERY — RIGHT HEART CATH
Anesthesia: LOCAL

## 2018-10-27 MED ORDER — LIDOCAINE HCL (PF) 1 % IJ SOLN
INTRAMUSCULAR | Status: AC
  Filled 2018-10-27: qty 30

## 2018-10-27 MED ORDER — LIDOCAINE HCL (PF) 1 % IJ SOLN
INTRAMUSCULAR | Status: DC | PRN
  Administered 2018-10-27: 2 mL

## 2018-10-27 MED ORDER — HEPARIN (PORCINE) IN NACL 1000-0.9 UT/500ML-% IV SOLN
INTRAVENOUS | Status: AC
  Filled 2018-10-27: qty 1000

## 2018-10-27 MED ORDER — SODIUM CHLORIDE 0.9 % IV SOLN
INTRAVENOUS | Status: DC
  Administered 2018-10-27: 09:00:00 via INTRAVENOUS

## 2018-10-27 MED ORDER — FENTANYL CITRATE (PF) 100 MCG/2ML IJ SOLN
INTRAMUSCULAR | Status: AC
  Filled 2018-10-27: qty 2

## 2018-10-27 MED ORDER — HEPARIN (PORCINE) IN NACL 1000-0.9 UT/500ML-% IV SOLN
INTRAVENOUS | Status: DC | PRN
  Administered 2018-10-27: 500 mL

## 2018-10-27 MED ORDER — FENTANYL CITRATE (PF) 100 MCG/2ML IJ SOLN
INTRAMUSCULAR | Status: DC | PRN
  Administered 2018-10-27: 25 ug via INTRAVENOUS

## 2018-10-27 SURGICAL SUPPLY — 8 items
CATH BALLN WEDGE 5F 110CM (CATHETERS) ×2
HOVERMATT SINGLE USE (MISCELLANEOUS) ×2
KIT PV (KITS) ×2
PACK CARDIAC CATHETERIZATION (CUSTOM PROCEDURE TRAY) ×2
SHEATH GLIDE SLENDER 4/5FR (SHEATH) ×2
TRANSDUCER W/STOPCOCK (MISCELLANEOUS) ×2
TUBING ART PRESS 72  MALE/FEM (TUBING) ×1
TUBING ART PRESS 72 MALE/FEM (TUBING) ×1

## 2018-10-27 NOTE — Progress Notes (Signed)
Physical Therapy Treatment Patient Details Name: [name] [name] MRN: [id number] DOB: 10/23/1950 Today's Date: 10/27/2018    History of Present Illness Pt is a 68 y.o. female admitted 10/20/18 with worsening SOB. Worked up for CHF exacerbation. 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 PMH includes CHF, CKD III, HTN, DM2, obesity, OSA.   PT Comments    Pt progressing with mobility. Ambulatory with rollator at supervision-level; SpO2 >90% on RA, although pt with DOE up to 3/4 requiring prolonged rest breaks. Demonstrates good technique with incentive spirometry. Pt hopeful to not require supplemental O2 at d/c.    Follow Up Recommendations  Home health PT;Supervision - Intermittent     Equipment Recommendations  None recommended by PT    Recommendations for Other Services       Precautions / Restrictions Precautions Precautions: Fall Precaution Comments: Watch SpO2 Restrictions Weight Bearing Restrictions: No    Mobility  Bed Mobility Overal bed mobility: Modified Independent             General bed mobility comments: Received sitting in recliner  Transfers Overall transfer level: Modified independent Equipment used: 4-wheeled walker Transfers: Sit to/from Stand              Ambulation/Gait Ambulation/Gait assistance: Supervision Gait Distance (Feet): 160 Feet Assistive device: 4-wheeled walker Gait Pattern/deviations: Step-through pattern;Decreased stride length;Trunk flexed Gait velocity: Decreased Gait velocity interpretation: 1.31 - 2.62 ft/sec, indicative of limited community ambulator General Gait Details: Slow, mostly steady gait with rollator and supervision for safety and to monitor SpO2. Required 1x prolonged seated rest break on rollator seat in hallway; pt demonstrates good safety with rollator brakes/positioning. SpO2 down to 90% on RA   Stairs Stairs: (Pt declined; reports confident ascending 3 steps with rail into  home, also has a door with no steps)           Wheelchair Mobility    Modified Rankin (Stroke Patients Only)       Balance Overall balance assessment: Mild deficits observed, not formally tested                                          Cognition Arousal/Alertness: Awake/alert Behavior During Therapy: WFL for tasks assessed/performed Overall Cognitive Status: Within Functional Limits for tasks assessed                                 General Comments: Sometimes slower processing; potentially baseline cognition      Exercises Other Exercises Other Exercises: incentive spirometer x 10    General Comments        Pertinent Vitals/Pain Pain Assessment: No/denies pain    Home Living                      Prior Function            PT Goals (current goals can now be found in the care plan section) Acute Rehab PT Goals Patient Stated Goal: Return home without having to wear oxygen PT Goal Formulation: With patient Time For Goal Achievement: 11/05/18 Potential to Achieve Goals: Good Progress towards PT goals: Progressing toward goals    Frequency    Min 3X/week      PT Plan Current plan remains appropriate    Co-evaluation  AM-PAC PT "6 Clicks" Mobility   Outcome Measure  Help needed turning from your back to your side while in a flat bed without using bedrails?: None Help needed moving from lying on your back to sitting on the side of a flat bed without using bedrails?: None Help needed moving to and from a bed to a chair (including a wheelchair)?: None Help needed standing up from a chair using your arms (e.g., wheelchair or bedside chair)?: None Help needed to walk in hospital room?: A Little Help needed climbing 3-5 steps with a railing? : A Lot 6 Click Score: 21    End of Session   Activity Tolerance: Patient tolerated treatment well Patient left: in chair;with call bell/phone within  reach Nurse Communication: Mobility status PT Visit Diagnosis: Other abnormalities of gait and mobility (R26.89)     Time: 9150-4136 PT Time Calculation (min) (ACUTE ONLY): 30 min  Charges:  $Gait Training: 23-37 mins                    Mabeline Caras, PT, DPT Acute Rehabilitation Services  Pager [phone] Office [place] 10/27/2018, 4:03 PM

## 2018-10-27 NOTE — Progress Notes (Signed)
PROGRESS NOTE  [name] [name]  ZDG:[id number] DOB: [birth year] DOA: 10/20/2018 PCP: [name], MD  Outpatient Specialists: Cardiology, Dr. Gwenlyn Found  Brief Narrative:  [name] [name] is a 68 y.o. female with a history of morbid obesity, OSA, HTN, HLD, T2DM, stage III CKD who presented to the ED at the insistence of her husband for shortness of breath for several weeks limiting her already limited mobility. She had orthopnea, leg swelling, and PND and stopped exerting herself due to dyspnea, Significant for acute CHF, her 2D echo showing drop in EF to 45%, uresis was managed by CHF team giving her advanced kidney disease, she went for right heart cath 10/27/2018.  Subjective:  Ports his dyspnea has improved, she still feels volume overloaded, went for right heart cath this morning.  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 -encourage to use incentive perimetry, flutter valve, continue with diuresis . -resolved , currently on room air  Acute on chronic systolic/diastolic CHF -  Echo 5/95 shows EF 45-50%, elevated left sided filling pressures. RV systolic function ok.  -Diuresis per cardiology, she was diuresing well with Lasix drip 15 mg/h, stable renal  function, yesterday renal function has worsened, overall she is down 18 pounds, diuretics has been held yesterday, renal function remains elevated today at 3, she went for right heart cath today, which did show that filling pressures are only mildly elevated at this point, this is currently on hold in the setting of her AKI, continue to monitor volume status closely, if creatinine remains elevated, then will need renal input. -Continue with Coreg at low dose, of BiDil to keep blood pressure up - well has ACE allergy  -Was counseled about fluid restriction and low-salt diet   Wheezing/bronchitis, tobacco use:  - Dulera, albuterol prn, he is on IV steroids, currently steroids has been tapered- Cessation counseling discussed  AKI CKD stage IV  -Baseline creatinine around 2-2.4, did peak at 2.9 initially, then improved with diuresis, now creatinine is at 3, monitor closely as diuresis on hold . - she did require Diamox for contraction alkalosis  Hyperkalemia -potassium of 5.7, resolved with Kayexalate, will monitor closely as she is on diuresis,  IDT2DM: HbA1c 7.2% - Continue basal-bolus insulin and correct with SSI. Decrease 70/30 mix because she will not be eating nearly as poorly here as at home. - Continue trulicity.  OSA:  - Needs outpatient evaluation  PAD:  - Continue plavix, statin  Morbid obesity: BMI 46. Discussed that this is a primary underlying factor in her future health. - Dietitian consult  Hypothyroidism:  - Continue synthroid  Incidental pulmonary nodule: Interval development of 11 mm nodule seen in left lower lobe. This is concerning for possible neoplasm or malignancy.  - Consider one of the following in 3 months for both low-risk and high-risk  individuals: (a) repeat chest CT, (b) follow-up PET-CT, or (c) tissue sampling. -I have scheduled an appointment for her with pulmonary as an outpatient with Dr. Elsworth Soho on January 30 at 11:15 AM, I have discussed at length with  the patient  Tobacco abuse -Was counseled  DVT prophylaxis: Weight-dosed lovenox Code Status: Full Family Communication: Son at bedside Disposition Plan: Home when stable  Consultants:   Cardiology to evaluate 1/15  Procedures:   Echocardiogram 10/21/2018: - Left ventricle: The cavity size was mildly dilated. Wall   thickness was normal. Systolic function was mildly reduced. The   estimated ejection fraction was in the range of 45% to 50%.   Diffuse hypokinesis. Doppler parameters are consistent with both   elevated ventricular end-diastolic filling pressure and elevated   left atrial filling pressure. - Left atrium: The atrium was moderately dilated.  Antimicrobials:  None     Objective: Vitals:   10/27/18 0721 10/27/18 0728 10/27/18 0859 10/27/18 1036  BP:    (!) 142/70  Pulse:    75  Resp:      Temp:      TempSrc:      SpO2: (!) 78% 96% 95%   Weight:      Height:        Intake/Output Summary (Last 24 hours) at 10/27/2018 1603 Last data filed at 10/27/2018 1356 Gross per 24 hour  Intake 680 ml  Output 1200 ml  Net -520 ml   Filed Weights   10/25/18 0602 10/26/18 0333 10/27/18 0447  Weight: 122.1 kg 119.3 kg 118.9 kg    Physical exam:  Awake Alert, Oriented X 3, No new F.N deficits, Normal affect Symmetrical Chest wall movement, Good air movement bilaterally, CTAB RRR,No Gallops,Rubs or new Murmurs, No Parasternal Heave +ve B.Sounds, Abd Soft, No tenderness, No rebound - guarding or rigidity. No Cyanosis, Clubbing ,+1  edema, No new Rash or bruise      Data Reviewed: I have personally reviewed following labs and imaging studies  CBC: Recent Labs  Lab 10/26/18 0450  WBC 9.6  HGB 10.9*  HCT 35.8*  MCV 86.5  PLT 578   Basic Metabolic Panel: Recent Labs  Lab 10/23/18 0555 10/24/18 0408 10/25/18 0648 10/26/18 0450 10/27/18 0521  NA 141 139 139 139 140  K 4.3 3.8 3.8 4.0 4.1  CL 95* 91* 85* 85* 86*  CO2 35* 40* 42* 43* 41*  GLUCOSE  121* 157* 93 115* 127*  BUN 84* 90* 95* 100* 105*  CREATININE 2.72* 2.68* 2.41* 2.93* 3.02*  CALCIUM 10.3 9.8 10.4* 10.0 10.1  MG 2.8*  --   --  2.7*  --    GFR: Estimated Creatinine Clearance: 22.6 mL/min (A) (by C-G formula based on SCr of 3.02 mg/dL (H)). Liver Function Tests: No results for input(s): AST, ALT, ALKPHOS, BILITOT, PROT, ALBUMIN in the last 168 hours. No results for input(s): LIPASE, AMYLASE in the last 168 hours. No results for input(s): AMMONIA in the last 168 hours. Coagulation Profile: No results for input(s): INR, PROTIME in the last 168 hours. Cardiac Enzymes: No results for input(s): CKTOTAL, CKMB, CKMBINDEX, TROPONINI in the last 168 hours. BNP (last 3 results) No results for input(s): PROBNP in the last 8760 hours. HbA1C: No results for input(s): HGBA1C in the last 72 hours. CBG: Recent Labs  Lab 10/26/18 1159 10/26/18 1701 10/26/18 2116 10/27/18 0810 10/27/18 1237  GLUCAP 138* 187* 164* 117* 195*   Lipid Profile: No results for input(s): CHOL, HDL, LDLCALC, TRIG, CHOLHDL,  LDLDIRECT in the last 72 hours. Thyroid Function Tests: No results for input(s): TSH, T4TOTAL, FREET4, T3FREE, THYROIDAB in the last 72 hours. Anemia Panel: No results for input(s): VITAMINB12, FOLATE, FERRITIN, TIBC, IRON, RETICCTPCT in the last 72 hours. Urine analysis:    Component Value Date/Time   COLORURINE YELLOW 10/20/2018 0853   APPEARANCEUR HAZY (A) 10/20/2018 0853   LABSPEC 1.011 10/20/2018 0853   PHURINE 5.0 10/20/2018 [place] 10/20/2018 0853   HGBUR NEGATIVE 10/20/2018 0853   [place] 10/20/2018 0853   [place] 10/20/2018 0853   PROTEINUR 30 (A) 10/20/2018 0853   UROBILINOGEN 0.2 08/04/2014 1409   NITRITE NEGATIVE 10/20/2018 0853   LEUKOCYTESUR NEGATIVE 10/20/2018 0853   No results found for this or any previous visit (from the past 240 hour(s)).    Radiology Studies: No results found.  Scheduled Meds: .  allopurinol  100 mg Oral Daily  . calcium-vitamin D  1 tablet Oral Q breakfast  . carvedilol  3.125 mg Oral BID WC  . clopidogrel  75 mg Oral Daily  . Dulaglutide  1.5 mg Subcutaneous Q Mon  . fenofibrate  160 mg Oral Daily  . heparin injection (subcutaneous)  5,000 Units Subcutaneous Q8H  . insulin aspart  0-5 Units Subcutaneous QHS  . insulin aspart  0-9 Units Subcutaneous TID WC  . insulin aspart protamine- aspart  30 Units Subcutaneous BID WC  . ipratropium-albuterol  3 mL Nebulization BID  . levothyroxine  150 mcg Oral QAC breakfast  . mometasone-formoterol  2 puff Inhalation BID  . multivitamin with minerals  1 tablet Oral Daily  . pneumococcal 23 valent vaccine  0.5 mL Intramuscular Tomorrow-1000  . polyethylene glycol  34 g Oral Daily  . potassium chloride  20 mEq Oral Daily  . simvastatin  20 mg Oral QHS  . sodium chloride flush  3 mL Intravenous Q12H  . tiZANidine  4 mg Oral BID   Continuous Infusions: . sodium chloride Stopped (10/22/18 0200)     LOS: 7 days    Phillips Climes, MD [facility] [email or web address] Password [email or web address] 10/27/2018, 4:03 PM

## 2018-10-27 NOTE — Progress Notes (Signed)
PT Cancellation Note  Patient Details Name: [name] [name] MRN: [id number] DOB: 1951-08-22   Cancelled Treatment:    Reason Eval/Treat Not Completed: Patient at procedure or test/unavailable, off floor for RHC. Will follow-up for PT treatment as schedule permits.  Mabeline Caras, PT, DPT Acute Rehabilitation Services  Pager [phone] Office [place] 10/27/2018, 8:48 AM

## 2018-10-27 NOTE — Progress Notes (Signed)
Pt refusing CPAP

## 2018-10-27 NOTE — Progress Notes (Signed)
Occupational Therapy Treatment Patient Details Name: [name] [name] MRN: [id number] DOB: 1951-01-13 Today's Date: 10/27/2018    History of present illness Pt is a 68 y.o. female admitted 10/20/18 with worsening SOB. Worked up for CHF exacerbation. 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 PMH includes CHF, CKD III, HTN, DM2, obesity, OSA.   OT comments  Pt making progress although continues to require O2 for mobility. Completed simple ADL at sink level followed by ambulation @ 100 ft on RA with O2 sats dropping to 85. Dyspnea 2/4. Pt reports she feels "better but not normal". Pt educated on use of incentive spirometer. Began education on energy conservation. Discussed smoking cessation and educational materials given to pt. Will continue to follow acutely. Continue to recommend [place] for follow up.   Follow Up Recommendations  Home health OT;Supervision - Intermittent    Equipment Recommendations  None recommended by OT    Recommendations for Other Services      Precautions / Restrictions Precautions Precautions: Fall Precaution Comments: 2 L O2; watch O2 Sats Restrictions Weight Bearing Restrictions: No       Mobility Bed Mobility Overal bed mobility: Modified Independent                Transfers Overall transfer level: Modified independent                    Balance Overall balance assessment: Mild deficits observed, not formally tested                                         ADL either performed or assessed with clinical judgement   ADL Overall ADL's : Needs assistance/impaired                                     Functional mobility during ADLs: Supervision/safety;Rolling walker General ADL Comments: able to complete ADL with set up @ sink level followed by ambulation @ 100 ft with desat to 85 on RA. Discussed need to use rollator when out in community. discussed desat on RA. Began educaiton  regaridng energy conservation. Husband present for session.      Vision       Perception     Praxis      Cognition Arousal/Alertness: Awake/alert Behavior During Therapy: WFL for tasks assessed/performed Overall Cognitive Status: Within Functional Limits for tasks assessed                                 General Comments: Increased time for processing; may be related to meds        Exercises Other Exercises Other Exercises: incentive spirometer x 10   Shoulder Instructions       General Comments      Pertinent Vitals/ Pain       Pain Assessment: No/denies pain  Home Living                                          Prior Functioning/Environment              Frequency  Min 2X/week        Progress  Toward Goals  OT Goals(current goals can now be found in the care plan section)  Progress towards OT goals: Progressing toward goals  Acute Rehab OT Goals Patient Stated Goal: Return home without having to wear oxygen OT Goal Formulation: With patient/family Time For Goal Achievement: 11/01/18 Potential to Achieve Goals: Good ADL Goals Pt Will Perform Upper Body Bathing: with modified independence Pt Will Perform Lower Body Bathing: with modified independence Pt Will Perform Upper Body Dressing: with modified independence Pt Will Perform Lower Body Dressing: with modified independence Pt Will Transfer to Toilet: with modified independence  Plan Discharge plan remains appropriate    Co-evaluation                 AM-PAC OT "6 Clicks" Daily Activity     Outcome Measure   Help from another person eating meals?: None Help from another person taking care of personal grooming?: None Help from another person toileting, which includes using toliet, bedpan, or urinal?: None Help from another person bathing (including washing, rinsing, drying)?: A Little Help from another person to put on and taking off regular upper body  clothing?: A Little Help from another person to put on and taking off regular lower body clothing?: A Little 6 Click Score: 21    End of Session Equipment Utilized During Treatment: Gait belt;Oxygen(2L; rollator)  OT Visit Diagnosis: Unsteadiness on feet (R26.81);Muscle weakness (generalized) (M62.81)   Activity Tolerance Patient tolerated treatment well   Patient Left in chair;with call bell/phone within reach;with family/visitor present   Nurse Communication Mobility status;Other (comment)(O2 sats)        Time: 2800-3491 OT Time Calculation (min): 29 min  Charges: OT General Charges $OT Visit: 1 Visit OT Treatments $Self Care/Home Management : 23-37 mins  Maurie Boettcher, OT/L   Acute OT Clinical Specialist [place] Pager [phone] Office [phone]    [facility] 10/27/2018, 1:30 PM

## 2018-10-27 NOTE — Plan of Care (Signed)
  Problem: Education: Goal: Ability to demonstrate management of disease process will improve Outcome: Progressing Goal: Ability to verbalize understanding of medication therapies will improve Outcome: Progressing Goal: Individualized Educational Video(s) Outcome: Progressing   Problem: Activity: Goal: Capacity to carry out activities will improve Outcome: Progressing   Problem: Cardiac: Goal: Ability to achieve and maintain adequate cardiopulmonary perfusion will improve Outcome: Progressing   Problem: Education: Goal: Knowledge of disease or condition will improve Outcome: Progressing Goal: Knowledge of the prescribed therapeutic regimen will improve Outcome: Progressing Goal: Individualized Educational Video(s) Outcome: Progressing   Problem: Activity: Goal: Ability to tolerate increased activity will improve Outcome: Progressing Goal: Will verbalize the importance of balancing activity with adequate rest periods Outcome: Progressing   Problem: Respiratory: Goal: Ability to maintain a clear airway will improve Outcome: Progressing Goal: Levels of oxygenation will improve Outcome: Progressing Goal: Ability to maintain adequate ventilation will improve Outcome: Progressing   Problem: Education: Goal: Knowledge of General Education information will improve Description Including pain rating scale, medication(s)/side effects and non-pharmacologic comfort measures Outcome: Progressing   Problem: Health Behavior/Discharge Planning: Goal: Ability to manage health-related needs will improve Outcome: Progressing   Problem: Clinical Measurements: Goal: Ability to maintain clinical measurements within normal limits will improve Outcome: Progressing Goal: Will remain free from infection Outcome: Progressing Goal: Diagnostic test results will improve Outcome: Progressing Goal: Respiratory complications will improve Outcome: Progressing Goal: Cardiovascular complication  will be avoided Outcome: Progressing   Problem: Activity: Goal: Risk for activity intolerance will decrease Outcome: Progressing   Problem: Nutrition: Goal: Adequate nutrition will be maintained Outcome: Progressing   Problem: Coping: Goal: Level of anxiety will decrease Outcome: Progressing   Problem: Elimination: Goal: Will not experience complications related to bowel motility Outcome: Progressing Goal: Will not experience complications related to urinary retention Outcome: Progressing   Problem: Pain Managment: Goal: General experience of comfort will improve Outcome: Progressing   Problem: Safety: Goal: Ability to remain free from injury will improve Outcome: Progressing   Problem: Skin Integrity: Goal: Risk for impaired skin integrity will decrease Outcome: Progressing

## 2018-10-27 NOTE — Progress Notes (Signed)
Pt has hx of OSA and has used c-pap in the past, but does not use now, RN explained the importance of the c-pap to help with OSA,, will continue to monitor, Thanks Arvella Nigh RN.

## 2018-10-27 NOTE — Progress Notes (Signed)
Pt stated she can tolerate the nasal pillows for her c-pap, but she does not have this mask at home, has a full face mask instead which she does not tolerate, thanks Arvella Nigh RN.

## 2018-10-27 NOTE — Progress Notes (Addendum)
Advanced Heart Failure Rounding Note  PCP-Cardiologist: No primary care provider on file.   Subjective:    Lasix and metolazone held yesterday with rising creatinine. Diamox continued. Creatinine 2.4 -> 2.9 -> 3.0. Weight down 1 more lbs (14 lbs total).   Denies SOB. Says she walked in hallways yesterday with no problem. She still feels like she has extra fluid. Back on O2 this am.   Going for RHC this morning.   Objective:   Weight Range: 118.9 kg Body mass index is 45.01 kg/m.   Vital Signs:   Temp:  [97.6 F (36.4 C)-98.5 F (36.9 C)] 98.5 F (36.9 C) (01/20 0447) Pulse Rate:  [80-89] 89 (01/20 0447) Resp:  [18-20] 18 (01/20 0447) BP: (122-132)/(56-77) 132/61 (01/20 0447) SpO2:  [78 %-100 %] 96 % (01/20 0728) Weight:  [118.9 kg] 118.9 kg (01/20 0447) Last BM Date: 10/25/18  Weight change: Filed Weights   10/25/18 0602 10/26/18 0333 10/27/18 0447  Weight: 122.1 kg 119.3 kg 118.9 kg    Intake/Output:   Intake/Output Summary (Last 24 hours) at 10/27/2018 0801 Last data filed at 10/27/2018 0700 Gross per 24 hour  Intake 680 ml  Output 2000 ml  Net -1320 ml      Physical Exam    General: No resp difficulty. HEENT: Normal Neck: Supple. JVP difficult, but does not appear elevated. Carotids 2+ bilat; no bruits. No thyromegaly or nodule noted. Cor: PMI nondisplaced. RRR, No M/G/R noted Lungs: CTAB, normal effort. On 3 L [place] Abdomen: Soft, non-tender, non-distended, no HSM. No bruits or masses. +BS  Extremities: No cyanosis, clubbing, or rash. R and LLE trace ankle edema. TED on RLE Neuro: Alert & orientedx3, cranial nerves grossly intact. moves all 4 extremities w/o difficulty. Affect pleasant  Telemetry   NSR 80-90s. Personally reviewed.   EKG    No new tracings.   Labs    CBC Recent Labs    10/26/18 0450  WBC 9.6  HGB 10.9*  HCT 35.8*  MCV 86.5  PLT 798   Basic Metabolic Panel Recent Labs    10/26/18 0450 10/27/18 0521  NA 139 140    K 4.0 4.1  CL 85* 86*  CO2 43* 41*  GLUCOSE 115* 127*  BUN 100* 105*  CREATININE 2.93* 3.02*  CALCIUM 10.0 10.1  MG 2.7*  --    Liver Function Tests No results for input(s): AST, ALT, ALKPHOS, BILITOT, PROT, ALBUMIN in the last 72 hours. No results for input(s): LIPASE, AMYLASE in the last 72 hours. Cardiac Enzymes No results for input(s): CKTOTAL, CKMB, CKMBINDEX, TROPONINI in the last 72 hours.  BNP: BNP (last 3 results) Recent Labs    10/20/18 0853  BNP 500.3*    ProBNP (last 3 results) No results for input(s): PROBNP in the last 8760 hours.   D-Dimer No results for input(s): DDIMER in the last 72 hours. Hemoglobin A1C No results for input(s): HGBA1C in the last 72 hours. Fasting Lipid Panel No results for input(s): CHOL, HDL, LDLCALC, TRIG, CHOLHDL, LDLDIRECT in the last 72 hours. Thyroid Function Tests No results for input(s): TSH, T4TOTAL, T3FREE, THYROIDAB in the last 72 hours.  Invalid input(s): FREET3  Other results:   Imaging    No results found.   Medications:     Scheduled Medications: . acetaZOLAMIDE  250 mg Oral BID  . allopurinol  100 mg Oral Daily  . calcium-vitamin D  1 tablet Oral Q breakfast  . carvedilol  3.125 mg Oral BID WC  .  clopidogrel  75 mg Oral Daily  . Dulaglutide  1.5 mg Subcutaneous Q Mon  . fenofibrate  160 mg Oral Daily  . heparin injection (subcutaneous)  5,000 Units Subcutaneous Q8H  . insulin aspart  0-5 Units Subcutaneous QHS  . insulin aspart  0-9 Units Subcutaneous TID WC  . insulin aspart protamine- aspart  30 Units Subcutaneous BID WC  . ipratropium-albuterol  3 mL Nebulization BID  . levothyroxine  150 mcg Oral QAC breakfast  . mometasone-formoterol  2 puff Inhalation BID  . multivitamin with minerals  1 tablet Oral Daily  . pneumococcal 23 valent vaccine  0.5 mL Intramuscular Tomorrow-1000  . polyethylene glycol  34 g Oral Daily  . potassium chloride  20 mEq Oral Daily  . simvastatin  20 mg Oral QHS  .  sodium chloride flush  3 mL Intravenous Q12H  . tiZANidine  4 mg Oral BID    Infusions: . sodium chloride Stopped (10/22/18 0200)    PRN Medications: sodium chloride, acetaminophen, albuterol, calcium carbonate, fluticasone, hydrOXYzine, ondansetron (ZOFRAN) IV, oxyCODONE-acetaminophen **AND** oxyCODONE, polyvinyl alcohol, sodium chloride flush    Patient Profile   [name] [name] is a 68 y.o. female with a history of TIAs, tobacco use, HTN, DM, HL, PAD followed by Dr Oneida Alar, chronic diastolic HF, CKD IV followed by Dr Florene Glen, hypothyroidism, morbid obesity, and LBBB.  Admitted with volume overload despite high dose diuretics at home. Dr Gwenlyn Found requests that we assist with diuresis in setting of AKI.  Assessment/Plan   1. A/C combined (primary diastolic) HF - LHC 1610 with normal coronaries.  - Echo 1/15: EF 45-50%. Echo 04/2015: EF 60-65%, grade 1 DD. Echo 10/2018: EF 45-50%, diffuse HK, LA mildly dilated. RV normal  - Volume looks okay on exam. She is still symptomatic. Weight down 14 lbs total. Lasix and metolazone held yesterday with rising creatinine. Creatinine worse today. Going for RHC today to assess hemodynamics. Discussed procedure with patient.  - Bicarb 41. On diamox 250 bid. May be able to stop today. Will discuss with MD.  - Continue coreg 3.125 mg BID - Off bidil with soft BPs.   2. Acute on chronic renal failure - baseline CKD IV KI - Unclear baseline. No labs in epic since 04/2017, but creatinine was 2.44 at that time. - Followed with Dr Florene Glen outpatient.  - Creatinine 2.72 -> 2.68 -> 2.41 -> 2.93 -> 3.02 - Diuretics on hold. RHC today.   3. Acute hypoxic respiratory failure - Does not wear any oxygen at home. On 3L [place] this am.  - Finished prednisone. - No PFTs on file.   4. Tobacco use - Continues to smoke. 1 pack lasts her 3-4 days. Discussed cessation. No change.   5. Obesity - Needs to lose weight. No change.   6. Hyperkalemia - K 4.1  today  7. ?OSA - Needs sleep study. No change.   8. Hx of TIA - Continue plavix and simvastatin. No change.   Medication concerns reviewed with patient and pharmacy team. Barriers identified: none at the time.    Length of Stay: [place], NP  10/27/2018, 8:01 AM  Advanced Heart Failure Team Pager [phone] (M-F; 7a - 4p)  Please contact [facility] for night-coverage after hours (4p -7a ) and weekends on [email or web address]  Patient seen with NP, agree with the above note.   Still feels short of breath walking but was able to walk in halls yesterday.  Diuretics on hold with elevated BUN/creatinine (  up to BUN 105, creatinine 3 today).   Still with good UOP yesterday.  BP stable.  RHC done today as below.   RHC Procedural Findings (1/20): Hemodynamics (mmHg) RA mean 9 RV 45/11 PA 42/17, mean 27 PCWP mean 18 Oxygen saturations: PA 62% AO 96% Cardiac Output (Fick) 5.79  Cardiac Index (Fick) 2.64 PVR 1.6 WU  On exam, JVP 8 cm.  No peripheral edema.  Regular S1S2, mildly distant BS.   1. Acute on chronic primarily diastolic CHF: Echo with EF 45-50% in 1/20. CHF is complicated by CKD stage IV at baseline, now with AKI in hospital. RHC today showed that filling pressures are only mildly elevated at this point.  BUN/creatinine continue to rise with BUN 105, creatinine 3 today.   - Continue Coreg at low dose.  - Off Bidil to keep BP up.    - Will leave off diuretics today and follow creatinine trend.  Will eventually need to restart.   2. AKI on CKD stage IV: Patient was admitted with volume overload and AKI, creatinine has worsened with diuresis. BP is stable.  She is now off diuretics. She likely would be a dialysis candidate if necessary.  - If creatinine continues to worsen off IV diuresis, will need nephrology input.  3. Smoking/suspected COPD: I urged her to quit smoking. She had course of prednisone this admission for ?component of COPD exacerbation.  Suspect  some of her residual dyspnea is due to COPD.  4. H/o TIA: On Plavix + statin.  Loralie Champagne 10/27/2018 9:44 AM

## 2018-10-27 NOTE — Progress Notes (Signed)
OT Cancellation    10/27/18 0900  OT Visit Information  Last OT Received On 10/27/18  Reason Eval/Treat Not Completed Patient at procedure or test/ unavailable ([place])  Maurie Boettcher, OT/L   Acute OT Clinical Specialist [place] Pager [phone] Office [phone]

## 2018-10-27 NOTE — Care Management Important Message (Signed)
Important Message  Patient Details  Name: [name] [name] MRN: [id number] Date of Birth: 1951/01/14   Medicare Important Message Given:  Yes    Jamarius Saha P [place] 10/27/2018, 2:27 PM

## 2018-10-28 DIAGNOSIS — N179 Acute kidney failure, unspecified: Secondary | ICD-10-CM

## 2018-10-28 LAB — BASIC METABOLIC PANEL
Anion gap: 11 (ref 5–15)
BUN: 104 mg/dL — ABNORMAL HIGH (ref 8–23)
CO2: 39 mmol/L — ABNORMAL HIGH (ref 22–32)
Calcium: 10.5 mg/dL — ABNORMAL HIGH (ref 8.9–10.3)
Chloride: 87 mmol/L — ABNORMAL LOW (ref 98–111)
Creatinine, Ser: 3.36 mg/dL — ABNORMAL HIGH (ref 0.44–1.00)
GFR calc Af Amer: 15 mL/min — ABNORMAL LOW (ref >60)
GFR calc non Af Amer: 13 mL/min — ABNORMAL LOW (ref >60)
Glucose, Bld: 70 mg/dL (ref 70–99)
Potassium: 4.2 mmol/L (ref 3.5–5.1)
Sodium: 137 mmol/L (ref 135–145)

## 2018-10-28 LAB — IRON AND TIBC
Iron: 68 ug/dL (ref 28–170)
Saturation Ratios: 13 % (ref 10.4–31.8)
TIBC: 511 ug/dL — ABNORMAL HIGH (ref 250–450)
UIBC: 443 ug/dL

## 2018-10-28 LAB — GLUCOSE, CAPILLARY
Glucose-Capillary: 87 mg/dL (ref 70–99)
Glucose-Capillary: 109 mg/dL — ABNORMAL HIGH (ref 70–99)
Glucose-Capillary: 99 mg/dL (ref 70–99)
Glucose-Capillary: 122 mg/dL — ABNORMAL HIGH (ref 70–99)

## 2018-10-28 LAB — FERRITIN: Ferritin: 63 ng/mL (ref 11–307)

## 2018-10-28 MED ORDER — PREGABALIN 25 MG PO CAPS
25.0000 mg | ORAL_CAPSULE | Freq: Every day | ORAL | Status: DC
  Administered 2018-10-28 – 2018-10-30 (×3): 25 mg via ORAL
  Filled 2018-10-28 (×3): qty 1

## 2018-10-28 MED FILL — Heparin Sod (Porcine)-NaCl IV Soln 1000 Unit/500ML-0.9%: INTRAVENOUS | Qty: 500 | Status: AC

## 2018-10-28 NOTE — Consult Note (Signed)
Reason for Consult: Acute kidney injury on chronic kidney disease stage IIIb/IV Referring Physician: Wyline Beady M.D. [facility])  HPI:  68 year old African-American woman with past medical history significant for hypertension, type 2 diabetes mellitus, combined systolic and diastolic heart failure, peripheral vascular disease, ongoing tobacco abuse and chronic kidney disease stage IIIb/IV at baseline (baseline creatinine about 2.0-2.4; follows up with Dr. [name]).  She was admitted 1 week ago with symptoms and signs of volume overload following inability to procure metolazone for about 3 weeks prior to presentation.  She was noted to have gained about 15 pounds of weight as a result of this and had evidence of exacerbation of congestive heart failure.  She has been on diuretics with volume unloading until about 2 days ago when diuretics were discontinued due to rising creatinine.  She was noted to have relative hypotension yesterday with a blood pressure of 103/58.  She has not had any iodinated intravenous contrast or high risk medication exposure.  She reports improvement of her shortness of breath following earlier diuretic therapy and denies any current orthopnea or exertional dyspnea.  Her main complaint is that of lower extremity edema and she expresses apprehension for dialysis.   10/21/2018  10/22/2018  10/23/2018  10/24/2018  10/25/2018  10/26/2018  10/27/2018  10/28/2018   BUN 62 (H) 73 (H) 84 (H) 90 (H) 95 (H) 100 (H) 105 (H) 104 (H)  Creatinine 2.36 (H) 2.99 (H) 2.72 (H) 2.68 (H) 2.41 (H) 2.93 (H) 3.02 (H) 3.36 (H)    Past Medical History:  Diagnosis Date  . Anemia   . Arthritis   . Asthma   . Bronchitis   . Chronic kidney disease   . Chronic kidney disease   . Chronic pain syndrome   . Cramping of feet   . Diabetes mellitus   . GERD (gastroesophageal reflux disease)   . Gout   . Headache   . History of hip replacement, total   . History of transfusion   . Hx MRSA infection     abscess left groin  . Hx of cardiac cath 06/2014  . Hyperlipidemia   . Hypertension   . Hypothyroid   . 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 Morbidly obese ([place])   . Peripheral vascular disease ([place])   . Positive cardiac stress test 12/2013   anterior and lateral ischemia on Myoview  . Sleep apnea    UNABLE TO TOLERATE C PAP  . Stress incontinence   . Thyroid disease   . TIA (transient ischemic attack)     X2 NO RESIDUAL PROBLEMS    Past Surgical History:  Procedure Laterality Date  . ABDOMINAL HYSTERECTOMY    . COLON SURGERY  1995   DUE TO POLYP  . FOREARM FRACTURE SURGERY     Left arm  . HERNIA REPAIR     w/ mesh  . INCISION AND DRAINAGE ABSCESS Left 02/04/2013   Procedure: INCISION AND DRAINAGE LEFT BUTTOCK ABSCESS; INCISION AND DRAINAGE LEFT BREAST ABSCESS;  Surgeon: Harl Bowie, MD;  Location: [place];  Service: General;  Laterality: Left;  . INCISION AND DRAINAGE ABSCESS N/A 02/12/2013   Procedure: INCISION AND DEBRIDEMENT BUTTOCK WOUND ;  Surgeon: [name]. Georgette Dover, MD;  Location: [place];  Service: General;  Laterality: N/A;  . INCISION AND DRAINAGE ABSCESS N/A 02/14/2013   Procedure: INCISION AND DRAINAGE/DRESSING CHANGE;  Surgeon: Harl Bowie, MD;  Location: [place];  Service: General;  Laterality: N/A;  . INCISION AND DRAINAGE  ABSCESS N/A 03/21/2015   Procedure: INCISION AND DRAINAGE PUBIC ABSCESS;  Surgeon: Excell Seltzer, MD;  Location: [facility];  Service: General;  Laterality: N/A;  . INCISION AND DRAINAGE PERIRECTAL ABSCESS Left 02/10/2013   Procedure: IRRIGATION AND DEBRIDEMENT OF BUTTOCK/PERINEAL ABSCESS;  Surgeon: [name]. Georgette Dover, MD;  Location: [place];  Service: General;  Laterality: Left;  . INCISION AND DRAINAGE PERIRECTAL ABSCESS N/A 02/16/2013   Procedure: IRRIGATION AND DEBRIDEMENT PERINEAL ABSCESS;  Surgeon: Zenovia Jarred, MD;  Location: [place];  Service: General;  Laterality: N/A;  . IRRIGATION AND DEBRIDEMENT ABSCESS N/A 02/06/2013   Procedure:  IRRIGATION AND DEBRIDEMENT BUTTOCK ABSCESS AND DRESSING CHANGE;  Surgeon: Harl Bowie, MD;  Location: [place];  Service: General;  Laterality: N/A;  . IRRIGATION AND DEBRIDEMENT ABSCESS Left 02/08/2013   Procedure: IRRIGATION AND DEBRIDEMENT ABSCESS/DRESSING CHANGE;  Surgeon: Gwenyth Ober, MD;  Location: [place];  Service: General;  Laterality: Left;  . JOINT REPLACEMENT  2010 / 2012   . LAPAROSCOPIC CHOLECYSTECTOMY    . LEFT HEART CATHETERIZATION WITH CORONARY ANGIOGRAM N/A 07/02/2014   Procedure: LEFT HEART CATHETERIZATION WITH CORONARY ANGIOGRAM;  Surgeon: Lorretta Harp, MD;  Location: [facility];  Service: Cardiovascular;  Laterality: N/A;  . LOWER EXTREMITY ANGIOGRAM Right 04/20/2016   Procedure: Lower Extremity Angiogram;  Surgeon: Elam Dutch, MD;  Location: [place] [facility];  Service: Cardiovascular;  Laterality: Right;  . PERIPHERAL VASCULAR CATHETERIZATION N/A 04/20/2016   Procedure: Abdominal Aortogram;  Surgeon: Elam Dutch, MD;  Location: [place] [facility];  Service: Cardiovascular;  Laterality: N/A;  . PERIPHERAL VASCULAR CATHETERIZATION Right 04/20/2016   Procedure: Peripheral Vascular Intervention;  Surgeon: Elam Dutch, MD;  Location: [place] [facility];  Service: Cardiovascular;  Laterality: Right;  popiteal  . RIGHT HEART CATH N/A 10/27/2018   Procedure: RIGHT HEART CATH;  Surgeon: Larey Dresser, MD;  Location: [place] [facility];  Service: Cardiovascular;  Laterality: N/A;  . THYROIDECTOMY    . TOTAL HIP REVISION Right 08/11/2014   Procedure: RIGHT ACETABULAR REVISION;  Surgeon: Gearlean Alf, MD;  Location: [facility];  Service: Orthopedics;  Laterality: Right;    Family History  Problem Relation Age of Onset  . Diabetes Brother   . Cardiomyopathy Mother   . Heart disease Mother   . Cancer Father     Social History:  reports that she has been smoking cigarettes. She has a 10.00 pack-year smoking history. She has never used smokeless tobacco. She  reports current alcohol use of about 1.0 standard drinks of alcohol per week. She reports that she does not use drugs.  Allergies:  Allergies  Allergen Reactions  . Nebivolol Swelling  . Ace Inhibitors Swelling and Other (See Comments)    Tongue swell  . Bystolic [Nebivolol Hcl] Swelling and Other (See Comments)    Chest pain   . Morphine And Related Itching    Medications:  Scheduled: . allopurinol  100 mg Oral Daily  . calcium-vitamin D  1 tablet Oral Q breakfast  . carvedilol  3.125 mg Oral BID WC  . clopidogrel  75 mg Oral Daily  . Dulaglutide  1.5 mg Subcutaneous Q Mon  . fenofibrate  160 mg Oral Daily  . heparin injection (subcutaneous)  5,000 Units Subcutaneous Q8H  . insulin aspart  0-5 Units Subcutaneous QHS  . insulin aspart  0-9 Units Subcutaneous TID WC  . insulin aspart protamine- aspart  30 Units Subcutaneous BID WC  . ipratropium-albuterol  3 mL Nebulization BID  . levothyroxine  150 mcg Oral QAC breakfast  . mometasone-formoterol  2 puff Inhalation BID  . multivitamin with minerals  1 tablet Oral Daily  . pneumococcal 23 valent vaccine  0.5 mL Intramuscular Tomorrow-1000  . polyethylene glycol  34 g Oral Daily  . potassium chloride  20 mEq Oral Daily  . simvastatin  20 mg Oral QHS  . sodium chloride flush  3 mL Intravenous Q12H  . tiZANidine  4 mg Oral BID    BMP Latest Ref Rng & Units 10/28/2018 10/27/2018 10/26/2018  Glucose 70 - 99 mg/dL 70 127(H) 115(H)  BUN 8 - 23 mg/dL 104(H) 105(H) 100(H)  Creatinine 0.44 - 1.00 mg/dL 3.36(H) 3.02(H) 2.93(H)  BUN/Creat Ratio 11 - 26 - - -  Sodium 135 - 145 mmol/L 137 140 139  Potassium 3.5 - 5.1 mmol/L 4.2 4.1 4.0  Chloride 98 - 111 mmol/L 87(L) 86(L) 85(L)  CO2 22 - 32 mmol/L 39(H) 41(H) 43(H)  Calcium 8.9 - 10.3 mg/dL 10.5(H) 10.1 10.0   CBC Latest Ref Rng & Units 10/26/2018 10/20/2018 10/20/2018  WBC 4.0 - 10.5 K/uL 9.6 13.6(H) 13.8(H)  Hemoglobin 12.0 - 15.0 g/dL 10.9(L) 11.3(L) 11.2(L)  Hematocrit 36.0 - 46.0 %  35.8(L) 37.1 37.2  Platelets 150 - 400 K/uL 366 365 356   No results found.  Review of Systems  Constitutional: Positive for malaise/fatigue. Negative for chills and fever.  HENT: Negative.   Eyes: Negative.   Respiratory: Negative for cough, sputum production and shortness of breath.   Cardiovascular: Positive for leg swelling. Negative for chest pain, palpitations and orthopnea.  Gastrointestinal: Negative.   Genitourinary: Negative.   Musculoskeletal: Negative.   Skin: Negative.   Neurological: Negative for dizziness and headaches.   Blood pressure 135/68, pulse 71, temperature 97.6 F (36.4 C), temperature source Oral, resp. rate 18, height 5\' 4"  (1.626 m), weight 119.4 kg, SpO2 97 %. Physical Exam  Nursing note and vitals reviewed. Constitutional: She is oriented to person, place, and time. She appears well-developed and well-nourished. No distress.  HENT:  Head: Normocephalic and atraumatic.  Nose: Nose normal.  Mouth/Throat: Oropharynx is clear and moist. No oropharyngeal exudate.  Eyes: Pupils are equal, round, and reactive to light. Conjunctivae and EOM are normal. No scleral icterus.  Neck: Normal range of motion. Neck supple.  Cardiovascular: Normal rate, regular rhythm and normal heart sounds.  No murmur heard. Respiratory: Effort normal and breath sounds normal. She has no wheezes. She has no rales.  GI: Soft. Bowel sounds are normal. There is no abdominal tenderness. There is no guarding.  Musculoskeletal: Normal range of motion.        General: Edema present.     Comments: 2+ lower extremity edema  Lymphadenopathy:    She has no cervical adenopathy.  Neurological: She is alert and oriented to person, place, and time. No cranial nerve deficit. Coordination normal.  Skin: Skin is warm and dry. No rash noted. No pallor.  Psychiatric: She has a normal mood and affect.    Assessment/Plan: 1.  Acute kidney injury on chronic kidney disease stage IIIb/IV: With  underlying chronic kidney disease that appears to be from diabetes mellitus/hypertension as well as prior episodes of acute kidney injury.  Current acute kidney injury appears to be hemodynamically mediated from recent escalation of diuretic use and possibly intravascular volume contraction in the setting of interstitial volume excess.  I agree with holding diuretics for another 24 hours to allow for fluid  re-equilibration into the intravascular space with potential improvement of renal perfusion.  She does not have any indications for dialysis at this time but I have sensitized her to the gravity of her situation and the possibility of needing extracorporeal fluid removal if she continues to have worsening renal function or development of uremic symptoms.  Reiterated the importance of sodium restriction and fluid restriction especially in the setting of holding diuretics. 2.  Acute exacerbation of congestive heart failure: With successful volume unloading with earlier diuretic therapy which is currently on hold because of her renal insufficiency.  Other than for pedal edema, she appears to be comfortable and without any evidence of volume overload. 3.  Anemia: Hemoglobin level/hematocrit drifting downwards, will check iron studies with next labs. 4.  Hypertension: Blood pressures appear to be under fair control with some transient relative hypotension noted yesterday.  Will need to limit aggressive blood pressure lowering in order to maintain appropriate renal perfusion. 5.  Tobacco use: Discussed the importance of tobacco cessation and revisited deleterious health effects of ongoing tobacco use. 6.  Obesity: Encouraged efforts at caloric restriction/aerobic exercise for promoting weight loss.   Gradie Ohm K. 10/28/2018, 11:09 AM

## 2018-10-28 NOTE — Progress Notes (Signed)
Patients family has brought in a all natural medication, will consult with MD's about use for home.

## 2018-10-28 NOTE — Progress Notes (Signed)
PROGRESS NOTE  [name] [name]  POE:[id number] DOB: 01/02/1951 DOA: 10/20/2018 PCP: [name], MD  Outpatient Specialists: Cardiology, Dr. Gwenlyn Found  Brief Narrative:  [name] [name] is a 68 y.o. female with a history of morbid obesity, OSA, HTN, HLD, T2DM, stage III CKD who presented to the ED at the insistence of her husband for shortness of breath for several weeks limiting her already limited mobility. She had orthopnea, leg swelling, and PND and stopped exerting herself due to dyspnea, Significant for acute CHF, her 2D echo showing drop in EF to 45%, uresis was managed by CHF team giving her advanced kidney disease, she went for right heart cath 10/27/2018, which did show only moderately elevated filling pressure, initially diuresing very well, but her renal function continues to worsen, so she is off diuresis currently, and renal consulted Subjective:  Ports her dyspnea has significantly improved, complains of lower extremity neuropathic pain .  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 -encourage to use incentive perimetry, flutter valve, continue with diuresis . -resolved , currently on room air  Acute on chronic systolic/diastolic CHF -  Echo 3/15 shows  EF 45-50%, elevated left sided filling pressures. RV systolic function ok.  -CHF team input greatly appreciated, initially she was doing well with diuresis, Lasix drip 15 mg/h, for she is -12.4 L during hospital stay, but given worsening renal function, diuresis has been held since yesterday, despite that renal function continues to increase, it is 3.3 today, so renal has been consulted regarding further recommendations . -Continue with Coreg at low dose, off BiDil to keep blood pressure up -as  well has ACE allergy  -Was counseled about fluid restriction and low-salt diet   Wheezing/bronchitis, tobacco use:  - Dulera, albuterol prn, he is on IV steroids, currently steroids has been tapered- Cessation counseling discussed  AKI CKD stage IV  -Baseline creatinine around 2-2.4, did peak at 2.9 initially, then improved with diuresis, creatinine trending up, she is off dialysis for last 24 hours, despite that creatinine is 2.3 today, renal consulted . -She did require Diamox for contraction alkalosis, has been stopped, bicarb is 39 now.  Hyperkalemia -potassium of 5.7, resolved with Kayexalate, will monitor closely as she is on diuresis,  IDT2DM: HbA1c 7.2% - Continue basal-bolus insulin and correct with SSI. Decrease 70/30 mix because she will not be eating nearly as poorly here as at home. - Continue trulicity. -With significant diabetic neuropathic pain, reports the [place] has not been held in the past, I will start on very low-dose Lyrica keeping in mind her renal failure.  OSA:  - Needs outpatient evaluation  PAD:  - Continue plavix, statin  Morbid obesity: BMI 46. Discussed that this is a primary underlying factor in her future health. - Dietitian consult  Hypothyroidism:  - Continue synthroid  Incidental pulmonary nodule: Interval development of 11 mm nodule seen in left lower lobe. This is concerning for possible neoplasm or malignancy.  - Consider one of the following in 3  months for both low-risk and high-risk individuals: (a) repeat chest CT, (b) follow-up PET-CT, or (c) tissue sampling. -I have scheduled an appointment for her with pulmonary as an outpatient with Dr. Elsworth Soho on January 30 at 11:15 AM, I have discussed at length with the patient and her son at bedside.  Aware of these findings and the importance of the follow-up  Tobacco abuse -Was counseled  DVT prophylaxis: Weight-dosed lovenox Code Status: Full Family Communication: Discussed with son at bedside yesterday Disposition Plan: Home when stable  Consultants:   Cardiology to evaluate 1/15  Procedures:   Echocardiogram 10/21/2018: - Left ventricle: The cavity size was mildly dilated. Wall   thickness was normal. Systolic function was mildly reduced. The   estimated ejection fraction was in the range of 45% to 50%.   Diffuse hypokinesis. Doppler parameters are consistent with both   elevated ventricular end-diastolic filling pressure and elevated   left atrial filling pressure. - Left atrium: The atrium was moderately dilated.  Antimicrobials:  None     Objective: Vitals:   10/28/18 0530 10/28/18 0800 10/28/18 1008 10/28/18 1158  BP: 129/63 135/68  140/72  Pulse: 70 71  72  Resp: 18   18  Temp: 98.1 F (36.7 C) 97.6 F (36.4 C)    TempSrc: Oral Oral    SpO2: 95% 94% 97% 90%  Weight: 119.4 kg     Height:        Intake/Output Summary (Last 24 hours) at 10/28/2018 1216 Last data filed at 10/28/2018 0959 Gross per 24 hour  Intake 1003 ml  Output 1900 ml  Net -897 ml   Filed Weights   10/26/18 0333 10/27/18 0447 10/28/18 0530  Weight: 119.3 kg 118.9 kg 119.4 kg    Physical exam:  Awake Alert, Oriented X 3, No new F.N deficits, Normal affect Symmetrical Chest wall movement, Good air movement bilaterally, CTAB RRR,No Gallops,Rubs or new Murmurs, No Parasternal Heave +ve B.Sounds, Abd Soft, No tenderness, No rebound - guarding or rigidity. No Cyanosis, Clubbing, +1  edema, No new Rash or bruise       Data Reviewed: I have personally reviewed following labs and imaging studies  CBC: Recent Labs  Lab 10/26/18 0450  WBC 9.6  HGB 10.9*  HCT 35.8*  MCV 86.5  PLT 540   Basic Metabolic Panel: Recent Labs  Lab 10/23/18 0555 10/24/18 0408 10/25/18 0648 10/26/18 0450 10/27/18 0521 10/28/18 0432  NA 141 139 139 139 140 137  K 4.3 3.8 3.8 4.0 4.1 4.2  CL 95* 91* 85* 85* 86* 87*  CO2 35* 40* 42* 43* 41* 39*  GLUCOSE 121* 157* 93 115* 127* 70  BUN 84* 90* 95* 100* 105* 104*  CREATININE 2.72* 2.68* 2.41* 2.93* 3.02* 3.36*  CALCIUM 10.3 9.8 10.4* 10.0 10.1 10.5*  MG 2.8*  --   --  2.7*  --   --    GFR: Estimated Creatinine Clearance: 20.4 mL/min (A) (by C-G formula based on SCr of 3.36 mg/dL (H)). Liver Function Tests: No results for input(s): AST, ALT, ALKPHOS, BILITOT, PROT, ALBUMIN in the last 168 hours. No results for input(s): LIPASE, AMYLASE in the last 168 hours. No results for input(s): AMMONIA in the last 168 hours. Coagulation Profile: No results for input(s): INR, PROTIME in the last 168 hours. Cardiac  Enzymes: No results for input(s): CKTOTAL, CKMB, CKMBINDEX, TROPONINI in the last 168 hours. BNP (last 3 results) No results for input(s): PROBNP in the last 8760 hours. HbA1C: No results for input(s): HGBA1C in the last 72 hours. CBG: Recent Labs  Lab 10/27/18 1237 10/27/18 1732 10/27/18 2103 10/28/18 0755 10/28/18 1155  GLUCAP 195* 195* 195* 87 109*   Lipid Profile: No results for input(s): CHOL, HDL, LDLCALC, TRIG, CHOLHDL, LDLDIRECT in the last 72 hours. Thyroid Function Tests: No results for input(s): TSH, T4TOTAL, FREET4, T3FREE, THYROIDAB in the last 72 hours. Anemia Panel: No results for input(s): VITAMINB12, FOLATE, FERRITIN, TIBC, IRON, RETICCTPCT in the last 72 hours. Urine analysis:    Component Value Date/Time   COLORURINE YELLOW 10/20/2018 0853   APPEARANCEUR HAZY (A) 10/20/2018 0853   LABSPEC 1.011  10/20/2018 0853   PHURINE 5.0 10/20/2018 [place] 10/20/2018 0853   HGBUR NEGATIVE 10/20/2018 0853   [place] 10/20/2018 0853   [place] 10/20/2018 0853   PROTEINUR 30 (A) 10/20/2018 0853   UROBILINOGEN 0.2 08/04/2014 1409   NITRITE NEGATIVE 10/20/2018 0853   LEUKOCYTESUR NEGATIVE 10/20/2018 0853   No results found for this or any previous visit (from the past 240 hour(s)).    Radiology Studies: No results found.  Scheduled Meds: . allopurinol  100 mg Oral Daily  . calcium-vitamin D  1 tablet Oral Q breakfast  . carvedilol  3.125 mg Oral BID WC  . clopidogrel  75 mg Oral Daily  . Dulaglutide  1.5 mg Subcutaneous Q Mon  . fenofibrate  160 mg Oral Daily  . heparin injection (subcutaneous)  5,000 Units Subcutaneous Q8H  . insulin aspart  0-5 Units Subcutaneous QHS  . insulin aspart  0-9 Units Subcutaneous TID WC  . insulin aspart protamine- aspart  30 Units Subcutaneous BID WC  . ipratropium-albuterol  3 mL Nebulization BID  . levothyroxine  150 mcg Oral QAC breakfast  . mometasone-formoterol  2 puff Inhalation BID  . multivitamin with minerals  1 tablet Oral Daily  . pneumococcal 23 valent vaccine  0.5 mL Intramuscular Tomorrow-1000  . polyethylene glycol  34 g Oral Daily  . potassium chloride  20 mEq Oral Daily  . simvastatin  20 mg Oral QHS  . sodium chloride flush  3 mL Intravenous Q12H  . tiZANidine  4 mg Oral BID   Continuous Infusions: . sodium chloride Stopped (10/22/18 0200)     LOS: 8 days    Phillips Climes, MD [facility] [email or web address] Password [email or web address] 10/28/2018, 12:16 PM

## 2018-10-28 NOTE — Progress Notes (Signed)
Physical Therapy Treatment Patient Details Name: [name] [name] MRN: [id number] DOB: Dec 13, 1950 Today's Date: 10/28/2018    History of Present Illness Pt is a 68 y.o. female admitted 10/20/18 with worsening SOB. Worked up for CHF exacerbation. 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 PMH includes CHF, CKD III, HTN, DM2, obesity, OSA.   PT Comments    Pt with increased lethargy this session, falling asleep during conversation. Of note, has ambulated twice today and been visiting with family. Pt on 2L O2 [place] upon entering room with SpO2 96%. [place] removed and SpO2 fluctuating from 88-94% while sitting and talking; replaced 1L O2 [place] (RN notified) as pt falling asleep. Pt mod indep to perform transfers in room. Encouraged continued incentive spirometry use and LE therex.    Follow Up Recommendations  Home health PT;Supervision - Intermittent     Equipment Recommendations  None recommended by PT    Recommendations for Other Services       Precautions / Restrictions Precautions Precautions: Fall Precaution Comments: Watch SpO2 Restrictions Weight Bearing Restrictions: No    Mobility  Bed Mobility Overal bed mobility: Modified Independent             General bed mobility comments: Received sitting in recliner  Transfers Overall transfer level: Modified independent Equipment used: None Transfers: Sit to/from Stand           General transfer comment: Mod indep to stand and take steps from recliner<>BSC with UE support on table/rail  Ambulation/Gait Ambulation/Gait assistance: Supervision               Stairs             Wheelchair Mobility    Modified Rankin (Stroke Patients Only)       Balance Overall balance assessment: Needs assistance Sitting-balance support: Feet supported Sitting balance-Leahy Scale: Good       Standing balance-Leahy Scale: Fair Standing balance comment: Can static stand without UE support;  dynamic stability improved with at least single UE support                            Cognition Arousal/Alertness: Lethargic Behavior During Therapy: Flat affect Overall Cognitive Status: Within Functional Limits for tasks assessed                                 General Comments: Pt falling asleep during conversation. Then sitting on BSC talking on phone, dozing off and starting to drop phone      Exercises Other Exercises Other Exercises: Repeated sit<>stands, LAQ, seated hip flexion    General Comments General comments (skin integrity, edema, etc.): While seated talking to pt, SpO2 fluctuating between 88-94% on RA (pt had been on 2L O2 [place] upon entering room with SpO2 96%); replaced 1L O2 [place] as pt falling asleep      Pertinent Vitals/Pain Pain Assessment: Faces Faces Pain Scale: Hurts a little bit Pain Location: BLEs Pain Descriptors / Indicators: Discomfort Pain Intervention(s): Monitored during session    Home Living                      Prior Function            PT Goals (current goals can now be found in the care plan section) Acute Rehab PT Goals Patient Stated Goal: Return home without having to wear  oxygen PT Goal Formulation: With patient Time For Goal Achievement: 11/05/18 Potential to Achieve Goals: Fair Progress towards PT goals: Progressing toward goals    Frequency    Min 3X/week      PT Plan Current plan remains appropriate    Co-evaluation              AM-PAC PT "6 Clicks" Mobility   Outcome Measure  Help needed turning from your back to your side while in a flat bed without using bedrails?: None Help needed moving from lying on your back to sitting on the side of a flat bed without using bedrails?: None Help needed moving to and from a bed to a chair (including a wheelchair)?: None Help needed standing up from a chair using your arms (e.g., wheelchair or bedside chair)?: None Help needed to walk in  hospital room?: A Little Help needed climbing 3-5 steps with a railing? : A Little 6 Click Score: 22    End of Session Equipment Utilized During Treatment: Oxygen Activity Tolerance: Patient limited by lethargy Patient left: in chair;with call bell/phone within reach Nurse Communication: Mobility status PT Visit Diagnosis: Other abnormalities of gait and mobility (R26.89)     Time: 2103-1281 PT Time Calculation (min) (ACUTE ONLY): 16 min  Charges:  $Therapeutic Activity: 8-22 mins                    Mabeline Caras, PT, DPT Acute Rehabilitation Services  Pager [phone] Office [place] 10/28/2018, 2:58 PM

## 2018-10-28 NOTE — Plan of Care (Signed)
  Problem: Education: Goal: Ability to demonstrate management of disease process will improve Outcome: Progressing   Problem: Activity: Goal: Capacity to carry out activities will improve Outcome: Progressing   

## 2018-10-28 NOTE — Progress Notes (Addendum)
Advanced Heart Failure Rounding Note  PCP-Cardiologist: No primary care provider on file.   Subjective:    Lasix and metolazone stopped 1/19. Diamox stopped yesterday. Creatinine 2.4 -> 2.9 -> 3.0 -> 3.36. RHC yesterday with only mildly elevated filling pressures. Weight up 1 lb.   Denies SOB. Walking in hallways with no problem. Still feels swollen in her legs.   RHC Procedural Findings (1/20): Hemodynamics (mmHg) RA mean 9 RV 45/11 PA 42/17, mean 27 PCWP mean 18 Oxygen saturations: PA 62% AO 96% Cardiac Output (Fick) 5.79  Cardiac Index (Fick) 2.64 PVR 1.6 WU  Objective:   Weight Range: 119.4 kg Body mass index is 45.18 kg/m.   Vital Signs:   Temp:  [98 F (36.7 C)-98.1 F (36.7 C)] 98.1 F (36.7 C) (01/21 0530) Pulse Rate:  [70-82] 70 (01/21 0530) Resp:  [18] 18 (01/21 0530) BP: (129-143)/(61-70) 129/63 (01/21 0530) SpO2:  [78 %-96 %] 95 % (01/21 0530) Weight:  [119.4 kg] 119.4 kg (01/21 0530) Last BM Date: 10/27/18  Weight change: Filed Weights   10/26/18 0333 10/27/18 0447 10/28/18 0530  Weight: 119.3 kg 118.9 kg 119.4 kg    Intake/Output:   Intake/Output Summary (Last 24 hours) at 10/28/2018 0714 Last data filed at 10/28/2018 0600 Gross per 24 hour  Intake 723 ml  Output 1900 ml  Net -1177 ml      Physical Exam    General: Obese. No resp difficulty. HEENT: Normal Neck: Supple. JVP ~7. Carotids 2+ bilat; no bruits. No thyromegaly or nodule noted. Cor: PMI nondisplaced. RRR, No M/G/R noted Lungs: clear, distant Abdomen: Soft, non-tender, non-distended, no HSM. No bruits or masses. +BS  Extremities: No cyanosis, clubbing, or rash. R and LLE 1+ ankle edema..  Neuro: Alert & orientedx3, cranial nerves grossly intact. moves all 4 extremities w/o difficulty. Affect pleasant  Telemetry   NSR 70s. Personally reviewed.   EKG    No new tracings.   Labs    CBC Recent Labs    10/26/18 0450  WBC 9.6  HGB 10.9*  HCT 35.8*  MCV 86.5    PLT 161   Basic Metabolic Panel Recent Labs    10/26/18 0450 10/27/18 0521 10/28/18 0432  NA 139 140 137  K 4.0 4.1 4.2  CL 85* 86* 87*  CO2 43* 41* 39*  GLUCOSE 115* 127* 70  BUN 100* 105* 104*  CREATININE 2.93* 3.02* 3.36*  CALCIUM 10.0 10.1 10.5*  MG 2.7*  --   --    Liver Function Tests No results for input(s): AST, ALT, ALKPHOS, BILITOT, PROT, ALBUMIN in the last 72 hours. No results for input(s): LIPASE, AMYLASE in the last 72 hours. Cardiac Enzymes No results for input(s): CKTOTAL, CKMB, CKMBINDEX, TROPONINI in the last 72 hours.  BNP: BNP (last 3 results) Recent Labs    10/20/18 0853  BNP 500.3*    ProBNP (last 3 results) No results for input(s): PROBNP in the last 8760 hours.   D-Dimer No results for input(s): DDIMER in the last 72 hours. Hemoglobin A1C No results for input(s): HGBA1C in the last 72 hours. Fasting Lipid Panel No results for input(s): CHOL, HDL, LDLCALC, TRIG, CHOLHDL, LDLDIRECT in the last 72 hours. Thyroid Function Tests No results for input(s): TSH, T4TOTAL, T3FREE, THYROIDAB in the last 72 hours.  Invalid input(s): FREET3  Other results:   Imaging    No results found.   Medications:     Scheduled Medications: . allopurinol  100 mg Oral Daily  .  calcium-vitamin D  1 tablet Oral Q breakfast  . carvedilol  3.125 mg Oral BID WC  . clopidogrel  75 mg Oral Daily  . Dulaglutide  1.5 mg Subcutaneous Q Mon  . fenofibrate  160 mg Oral Daily  . heparin injection (subcutaneous)  5,000 Units Subcutaneous Q8H  . insulin aspart  0-5 Units Subcutaneous QHS  . insulin aspart  0-9 Units Subcutaneous TID WC  . insulin aspart protamine- aspart  30 Units Subcutaneous BID WC  . ipratropium-albuterol  3 mL Nebulization BID  . levothyroxine  150 mcg Oral QAC breakfast  . mometasone-formoterol  2 puff Inhalation BID  . multivitamin with minerals  1 tablet Oral Daily  . pneumococcal 23 valent vaccine  0.5 mL Intramuscular Tomorrow-1000   . polyethylene glycol  34 g Oral Daily  . potassium chloride  20 mEq Oral Daily  . simvastatin  20 mg Oral QHS  . sodium chloride flush  3 mL Intravenous Q12H  . tiZANidine  4 mg Oral BID    Infusions: . sodium chloride Stopped (10/22/18 0200)    PRN Medications: sodium chloride, acetaminophen, albuterol, calcium carbonate, fluticasone, hydrOXYzine, ondansetron (ZOFRAN) IV, oxyCODONE-acetaminophen **AND** oxyCODONE, polyvinyl alcohol, sodium chloride flush    Patient Profile   [name] [name] is a 68 y.o. female with a history of TIAs, tobacco use, HTN, DM, HL, PAD followed by Dr Oneida Alar, chronic diastolic HF, CKD IV followed by Dr Florene Glen, hypothyroidism, morbid obesity, and LBBB.  Admitted with volume overload despite high dose diuretics at home. Dr Gwenlyn Found requests that we assist with diuresis in setting of AKI.  Assessment/Plan   1. A/C combined (primary diastolic) HF - LHC 7824 with normal coronaries.  - Echo 1/15: EF 45-50%. Echo 04/2015: EF 60-65%, grade 1 DD. Echo 10/2018: EF 45-50%, diffuse HK, LA mildly dilated. RV normal  - RHC 10/27/18 with only mildly elevated filling pressures, mild pulmonary HTN, and good CO.  - Volume okay on exam. Down 13 lbs total. Diuretics on hold with AKI.  - Bicarb 39. Diamox stopped yesterday.  - Continue coreg 3.125 mg BID - Off bidil with soft BPs.   2. Acute on chronic renal failure - baseline CKD IV  - Unclear baseline. No labs in epic since 04/2017, but creatinine was 2.44 at that time. - Followed with Dr Florene Glen outpatient.  - Creatinine 2.72 -> 2.68 -> 2.41 -> 2.93 -> 3.02 -> 3.36. - Diuretics on hold since 1/19. Likely will need to consult renal today.  3. Acute hypoxic respiratory failure - Does not wear any oxygen at home. On RA today.  - Finished prednisone. - No PFTs on file.   4. Tobacco use - Continues to smoke. 1 pack lasts her 3-4 days. Discussed cessation. No change.   5. Obesity - Needs to lose weight. No  change.   6. Hyperkalemia - K 4.2 today  7. ?OSA - Needs sleep study. Refusing CPAP.   8. Hx of TIA - Continue plavix and simvastatin. No change.   Medication concerns reviewed with patient and pharmacy team. Barriers identified: none at the time.    Length of Stay: [place], NP  10/28/2018, 7:14 AM  Advanced Heart Failure Team Pager [phone] (M-F; [place])  Please contact [facility] for night-coverage after hours (4p -7a ) and weekends on [email or web address]  Patient seen with NP, agree with the above note.  Creatinine is up again today.  She is off diuretics.  We had renal see  her, plan for now is to keep her off diuretics for another day to allow renal function to equilibrate.  On exam, she does not looks significantly volume overloaded.   Continue to mobilize, walk in halls.   [name] [name] 10/28/2018 4:12 PM

## 2018-10-28 NOTE — Progress Notes (Signed)
Pt has been agitated all night long. Pt has refused bed alarm, CPAP and help when walking. Pt states we have been mean to her and not been compliant to what she needs and wants. RN has met all requests per knowledge and paged the provider when warranted.

## 2018-10-29 DIAGNOSIS — N185 Chronic kidney disease, stage 5: Secondary | ICD-10-CM

## 2018-10-29 DIAGNOSIS — Z794 Long term (current) use of insulin: Secondary | ICD-10-CM

## 2018-10-29 DIAGNOSIS — E1122 Type 2 diabetes mellitus with diabetic chronic kidney disease: Secondary | ICD-10-CM

## 2018-10-29 DIAGNOSIS — I739 Peripheral vascular disease, unspecified: Secondary | ICD-10-CM

## 2018-10-29 DIAGNOSIS — G4733 Obstructive sleep apnea (adult) (pediatric): Secondary | ICD-10-CM

## 2018-10-29 LAB — RENAL FUNCTION PANEL
Albumin: 3.4 g/dL — ABNORMAL LOW (ref 3.5–5.0)
Anion gap: 9 (ref 5–15)
BUN: 102 mg/dL — ABNORMAL HIGH (ref 8–23)
CO2: 37 mmol/L — ABNORMAL HIGH (ref 22–32)
Calcium: 10.6 mg/dL — ABNORMAL HIGH (ref 8.9–10.3)
Chloride: 92 mmol/L — ABNORMAL LOW (ref 98–111)
Creatinine, Ser: 3.02 mg/dL — ABNORMAL HIGH (ref 0.44–1.00)
GFR calc Af Amer: 18 mL/min — ABNORMAL LOW (ref >60)
GFR calc non Af Amer: 15 mL/min — ABNORMAL LOW (ref >60)
Glucose, Bld: 84 mg/dL (ref 70–99)
Phosphorus: 3.6 mg/dL (ref 2.5–4.6)
Potassium: 4.2 mmol/L (ref 3.5–5.1)
Sodium: 138 mmol/L (ref 135–145)

## 2018-10-29 LAB — GLUCOSE, CAPILLARY
Glucose-Capillary: 107 mg/dL — ABNORMAL HIGH (ref 70–99)
Glucose-Capillary: 175 mg/dL — ABNORMAL HIGH (ref 70–99)
Glucose-Capillary: 191 mg/dL — ABNORMAL HIGH (ref 70–99)
Glucose-Capillary: 170 mg/dL — ABNORMAL HIGH (ref 70–99)

## 2018-10-29 MED ORDER — ALUM & MAG HYDROXIDE-SIMETH 200-200-20 MG/5ML PO SUSP
30.0000 mL | Freq: Once | ORAL | Status: AC
  Administered 2018-10-29: 30 mL via ORAL
  Filled 2018-10-29: qty 30

## 2018-10-29 MED ORDER — LIDOCAINE VISCOUS HCL 2 % MT SOLN
15.0000 mL | Freq: Once | OROMUCOSAL | Status: AC
  Administered 2018-10-29: 15 mL via ORAL
  Filled 2018-10-29: qty 15

## 2018-10-29 MED ORDER — SODIUM CHLORIDE 0.9 % IV SOLN
510.0000 mg | Freq: Once | INTRAVENOUS | Status: AC
  Administered 2018-10-29: 510 mg via INTRAVENOUS
  Filled 2018-10-29: qty 17

## 2018-10-29 NOTE — Progress Notes (Signed)
Patient ID: [name] [name], female   DOB: [birth year], 68 y.o.   MRN: [id number] [facility] Progress Note   Assessment/ Plan:   1.  Acute kidney injury on chronic kidney disease stage IIIb/IV: Acute kidney injury suspected to be hemodynamically mediated in the setting of CHF exacerbation and diuretic use.  Renal function improving with transient withholding of diuretics to allow for fluid equilibration/improve renal perfusion.  Recommend restarting diuretics tomorrow at her oral home dose. 2.  Acute exacerbation of congestive heart failure: With successful response to diuretics following admission/volume unloading manifest with improvement of her dyspnea.  She has pedal edema and I recommend restarting her diuretics again tomorrow. 3.  Anemia: Hemoglobin level/hematocrit drifting downwards, with iron deficiency noted on labs-we will give intravenous iron today. 4.  Hypertension: Blood pressures appear to be under fair control with some transient relative hypotension noted yesterday.    Continue to monitor blood pressure and limit hypotensive episodes. 5.  Tobacco use: Discussed the importance of tobacco cessation and revisited deleterious health effects of ongoing tobacco use. 6.  Obesity: Encouraged efforts at caloric restriction/aerobic exercise for promoting weight loss.  Subjective:   Reports to be feeling anxious because of inability to shower/engage in her usual routine.  Denies any shortness of breath or chest pain.   Objective:   BP 122/60 (BP Location: Left Arm)   Pulse 75   Temp 98 F (36.7 C) (Oral)   Resp 18   Ht 5\' 4"  (1.626 m)   Wt 119.7 kg   SpO2 97%   BMI 45.28 kg/m   Intake/Output Summary (Last 24 hours) at 10/29/2018 0847 Last data filed at 10/29/2018 0500 Gross per 24 hour  Intake 580 ml  Output 1001 ml  Net -421 ml   Weight change: 0.272 kg  Physical Exam: Gen: Comfortably sitting in bed, talking on phone CVS: Pulse regular rhythm, normal rate,  S1 and S2 normal Resp: Clear to auscultation, no rales/rhonchi Abd: Soft, obese, nontender Ext: 2+ edema lower extremity edema  Imaging: No results found.  Labs: BMET Recent Labs  Lab 10/23/18 0555 10/24/18 0408 10/25/18 7902 10/26/18 0450 10/27/18 0521 10/28/18 0432 10/29/18 0412  NA 141 139 139 139 140 137 138  K 4.3 3.8 3.8 4.0 4.1 4.2 4.2  CL 95* 91* 85* 85* 86* 87* 92*  CO2 35* 40* 42* 43* 41* 39* 37*  GLUCOSE 121* 157* 93 115* 127* 70 84  BUN 84* 90* 95* 100* 105* 104* 102*  CREATININE 2.72* 2.68* 2.41* 2.93* 3.02* 3.36* 3.02*  CALCIUM 10.3 9.8 10.4* 10.0 10.1 10.5* 10.6*  PHOS  --   --   --   --   --   --  3.6   CBC Recent Labs  Lab 10/26/18 0450  WBC 9.6  HGB 10.9*  HCT 35.8*  MCV 86.5  PLT 366    Medications:    . allopurinol  100 mg Oral Daily  . calcium-vitamin D  1 tablet Oral Q breakfast  . carvedilol  3.125 mg Oral BID WC  . clopidogrel  75 mg Oral Daily  . Dulaglutide  1.5 mg Subcutaneous Q Mon  . fenofibrate  160 mg Oral Daily  . heparin injection (subcutaneous)  5,000 Units Subcutaneous Q8H  . insulin aspart  0-5 Units Subcutaneous QHS  . insulin aspart  0-9 Units Subcutaneous TID WC  . insulin aspart protamine- aspart  30 Units Subcutaneous BID WC  . ipratropium-albuterol  3 mL Nebulization BID  . levothyroxine  150 mcg Oral QAC breakfast  . mometasone-formoterol  2 puff Inhalation BID  . multivitamin with minerals  1 tablet Oral Daily  . pneumococcal 23 valent vaccine  0.5 mL Intramuscular Tomorrow-1000  . polyethylene glycol  34 g Oral Daily  . pregabalin  25 mg Oral QHS  . simvastatin  20 mg Oral QHS  . sodium chloride flush  3 mL Intravenous Q12H  . tiZANidine  4 mg Oral BID   Elmarie Shiley, MD 10/29/2018, 8:47 AM

## 2018-10-29 NOTE — Progress Notes (Addendum)
Advanced Heart Failure Rounding Note  PCP-Cardiologist: No primary care provider on file.   Subjective:    Lasix and metolazone stopped 1/19. Diamox stopped 1/20. Weight unchanged. Creatinine better 3.36 -> 3.0 this morning. Renal consulted yesterday and agreed with holding diuretics. Not a candidate for HD currently, but they discussed at length.   Feels okay. Says her oxygen drops when she walks. No SOB. Wants to go home. Says her family thinks she is delirious.   RHC Procedural Findings (1/20): Hemodynamics (mmHg) RA mean 9 RV 45/11 PA 42/17, mean 27 PCWP mean 18 Oxygen saturations: PA 62% AO 96% Cardiac Output (Fick) 5.79  Cardiac Index (Fick) 2.64 PVR 1.6 WU  Objective:   Weight Range: 119.7 kg Body mass index is 45.28 kg/m.   Vital Signs:   Temp:  [97.3 F (36.3 C)-98.4 F (36.9 C)] 98 F (36.7 C) (01/22 0503) Pulse Rate:  [66-81] 75 (01/22 0503) Resp:  [18] 18 (01/22 0503) BP: (122-140)/(60-72) 122/60 (01/22 0503) SpO2:  [85 %-100 %] 97 % (01/22 0503) FiO2 (%):  [90 %-94 %] 94 % (01/21 1935) Weight:  [119.7 kg] 119.7 kg (01/22 0503) Last BM Date: 10/27/18  Weight change: Filed Weights   10/27/18 0447 10/28/18 0530 10/29/18 0503  Weight: 118.9 kg 119.4 kg 119.7 kg    Intake/Output:   Intake/Output Summary (Last 24 hours) at 10/29/2018 0733 Last data filed at 10/29/2018 0500 Gross per 24 hour  Intake 580 ml  Output 1001 ml  Net -421 ml      Physical Exam    General: Obese. No resp difficulty. HEENT: Normal Neck: Supple. JVP 7-8. Carotids 2+ bilat; no bruits. No thyromegaly or nodule noted. Cor: PMI nondisplaced. RRR, No M/G/R noted Lungs: clear, distant.  Abdomen: Soft, non-tender, non-distended, no HSM. No bruits or masses. +BS  Extremities: No cyanosis, clubbing, or rash. R and LLE 1+ ankle edema.  Neuro: Alert & orientedx3, cranial nerves grossly intact. moves all 4 extremities w/o difficulty. Affect pleasant   Telemetry   NSR  70-80s. Personally reviewed.   EKG    No new tracings.   Labs    CBC No results for input(s): WBC, NEUTROABS, HGB, HCT, MCV, PLT in the last 72 hours. Basic Metabolic Panel Recent Labs    10/27/18 0521 10/28/18 0432  NA 140 137  K 4.1 4.2  CL 86* 87*  CO2 41* 39*  GLUCOSE 127* 70  BUN 105* 104*  CREATININE 3.02* 3.36*  CALCIUM 10.1 10.5*   Liver Function Tests No results for input(s): AST, ALT, ALKPHOS, BILITOT, PROT, ALBUMIN in the last 72 hours. No results for input(s): LIPASE, AMYLASE in the last 72 hours. Cardiac Enzymes No results for input(s): CKTOTAL, CKMB, CKMBINDEX, TROPONINI in the last 72 hours.  BNP: BNP (last 3 results) Recent Labs    10/20/18 0853  BNP 500.3*    ProBNP (last 3 results) No results for input(s): PROBNP in the last 8760 hours.   D-Dimer No results for input(s): DDIMER in the last 72 hours. Hemoglobin A1C No results for input(s): HGBA1C in the last 72 hours. Fasting Lipid Panel No results for input(s): CHOL, HDL, LDLCALC, TRIG, CHOLHDL, LDLDIRECT in the last 72 hours. Thyroid Function Tests No results for input(s): TSH, T4TOTAL, T3FREE, THYROIDAB in the last 72 hours.  Invalid input(s): FREET3  Other results:   Imaging    No results found.   Medications:     Scheduled Medications: . allopurinol  100 mg Oral Daily  .  calcium-vitamin D  1 tablet Oral Q breakfast  . carvedilol  3.125 mg Oral BID WC  . clopidogrel  75 mg Oral Daily  . Dulaglutide  1.5 mg Subcutaneous Q Mon  . fenofibrate  160 mg Oral Daily  . heparin injection (subcutaneous)  5,000 Units Subcutaneous Q8H  . insulin aspart  0-5 Units Subcutaneous QHS  . insulin aspart  0-9 Units Subcutaneous TID WC  . insulin aspart protamine- aspart  30 Units Subcutaneous BID WC  . ipratropium-albuterol  3 mL Nebulization BID  . levothyroxine  150 mcg Oral QAC breakfast  . mometasone-formoterol  2 puff Inhalation BID  . multivitamin with minerals  1 tablet Oral  Daily  . pneumococcal 23 valent vaccine  0.5 mL Intramuscular Tomorrow-1000  . polyethylene glycol  34 g Oral Daily  . pregabalin  25 mg Oral QHS  . simvastatin  20 mg Oral QHS  . sodium chloride flush  3 mL Intravenous Q12H  . tiZANidine  4 mg Oral BID    Infusions: . sodium chloride Stopped (10/22/18 0200)    PRN Medications: sodium chloride, acetaminophen, albuterol, calcium carbonate, fluticasone, hydrOXYzine, ondansetron (ZOFRAN) IV, oxyCODONE-acetaminophen **AND** oxyCODONE, polyvinyl alcohol, sodium chloride flush    Patient Profile   [name] [name] is a 68 y.o. female with a history of TIAs, tobacco use, HTN, DM, HL, PAD followed by Dr Oneida Alar, chronic diastolic HF, CKD IV followed by Dr Florene Glen, hypothyroidism, morbid obesity, and LBBB.  Admitted with volume overload despite high dose diuretics at home. Dr Gwenlyn Found requests that we assist with diuresis in setting of AKI.  Assessment/Plan   1. A/C combined (primary diastolic) HF - LHC 9563 with normal coronaries.  - Echo 1/15: EF 45-50%. Echo 04/2015: EF 60-65%, grade 1 DD. Echo 10/2018: EF 45-50%, diffuse HK, LA mildly dilated. RV normal  - RHC 10/27/18 with only mildly elevated filling pressures, mild pulmonary HTN, and good CO.  - Volume okay on exam. Down 13 lbs total. Diuretics on hold with AKI. May be able to restart tomorrow.  - Continue coreg 3.125 mg BID - Off bidil with soft BPs.   2. Acute on chronic renal failure - baseline CKD IV  - Unclear baseline. No labs in epic since 04/2017, but creatinine was 2.44 at that time. - Followed with Dr Florene Glen outpatient.  - Creatinine 2.72 -> 2.68 -> 2.41 -> 2.93 -> 3.02 -> 3.36 -> 3.0 - Diuretics on hold since 1/19. Renal consulted and agreed with holding diuretics at least 24 hours. Not HD candidate at this time.  3. Acute hypoxic respiratory failure - Does not wear any oxygen at home. Wearing 2 L here. Will check desat screen today.  - Finished prednisone. - No PFTs  on file.   4. Tobacco use - Continues to smoke. 1 pack lasts her 3-4 days. Discussed cessation. No change.   5. Obesity - Needs to lose weight. No change.   6. Hyperkalemia - K 4.2  7. ?OSA - Needs sleep study. Refusing CPAP. We discussed this again today.   8. Hx of TIA - Continue plavix and simvastatin. No change.  Medication concerns reviewed with patient and pharmacy team. Barriers identified: none at the time.    Length of Stay: [address], NP  10/29/2018, 7:33 AM  Advanced Heart Failure Team Pager [phone] (M-F; 7a - 4p)  Please contact [facility] for night-coverage after hours (4p -7a ) and weekends on [email or web address]  Patient seen with NP, agree with  the above note.   We still have her off diuretics.  Creatinine is now starting to trend down.  No JVD or edema.  Weight is stable and she denies dyspnea.  - Would aim to start her on torsemide tomorrow if creatinine remains stable, likely 80 qam/40 qpm.  She was on Lasix 160 mg bid at home prior to admission. Would watch her 1 day of po diuretics to ensure creatinine stability then hopefully home Friday.   Loralie Champagne 10/29/2018 1:29 PM

## 2018-10-29 NOTE — Progress Notes (Signed)
Occupational Therapy Treatment and Discharge Patient Details Name: [name] [name] MRN: [id number] DOB: 03/18/1951 Today's Date: 10/29/2018    History of present illness Pt is a 68 y.o. female admitted 10/20/18 with worsening SOB. Worked up for CHF exacerbation. 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 PMH includes CHF, CKD III, HTN, DM2, obesity, OSA.   OT comments  Pt is functioning modified independently in ADL. Showered today in sitting. Fatigued with dyspnea at end of shower, educated in energy conservation strategies. Pt verbalized understanding. Has an aide who assists with ADL and IADL at home. No further OT needs.  Follow Up Recommendations  No OT follow up    Equipment Recommendations  None recommended by OT    Recommendations for Other Services      Precautions / Restrictions Precautions Precautions: Fall Precaution Comments: Watch SpO2       Mobility Bed Mobility                  Transfers Overall transfer level: Modified independent Equipment used: None                  Balance     Sitting balance-Leahy Scale: Good       Standing balance-Leahy Scale: Good Standing balance comment: ambulating without a device in room                           ADL either performed or assessed with clinical judgement   ADL Overall ADL's : Needs assistance/impaired     Grooming: Oral care;Standing;Set up   Upper Body Bathing: Set up;Sitting   Lower Body Bathing: Set up;Sitting/lateral leans   Upper Body Dressing : Set up;Sitting   Lower Body Dressing: Maximal assistance;Set up Lower Body Dressing Details (indicate cue type and reason): max for socks due to fatigue, donned depends independently Toilet Transfer: Modified Independent;Ambulation;BSC   Toileting- Clothing Manipulation and Hygiene: Modified independent;Sit to/from stand   Tub/ Shower Transfer: Supervision/safety;Ambulation;3 in 1   Functional  mobility during ADLs: Modified independent(within room) General ADL Comments: pt completing shower upon OT's arrival, dyspneic     Vision       Perception     Praxis      Cognition Arousal/Alertness: Awake/alert Behavior During Therapy: WFL for tasks assessed/performed Overall Cognitive Status: Within Functional Limits for tasks assessed                                 General Comments: pt with pushes herself to the point of fatigue and decreased safety        Exercises     Shoulder Instructions       General Comments      Pertinent Vitals/ Pain       Pain Assessment: Faces Faces Pain Scale: Hurts little more Pain Location: lower legs Pain Descriptors / Indicators: Discomfort;Guarding Pain Intervention(s): Monitored during session  Home Living                                          Prior Functioning/Environment              Frequency           Progress Toward Goals  OT Goals(current goals can now be found in the care plan  section)  Progress towards OT goals: Goals met/education completed, patient discharged from OT  Acute Rehab OT Goals Patient Stated Goal: Return home without having to wear oxygen  Plan All goals met and education completed, patient discharged from OT services    Co-evaluation                 AM-PAC OT "6 Clicks" Daily Activity     Outcome Measure   Help from another person eating meals?: None Help from another person taking care of personal grooming?: None Help from another person toileting, which includes using toliet, bedpan, or urinal?: None Help from another person bathing (including washing, rinsing, drying)?: None Help from another person to put on and taking off regular upper body clothing?: None Help from another person to put on and taking off regular lower body clothing?: None 6 Click Score: 24    End of Session    OT Visit Diagnosis: Other (comment)(decreased activity  tolerance)   Activity Tolerance Patient tolerated treatment well   Patient Left in chair;with call bell/phone within reach;with family/visitor present   Nurse Communication          Time: 4862-8241 OT Time Calculation (min): 30 min  Charges: OT General Charges $OT Visit: 1 Visit OT Treatments $Self Care/Home Management : 23-37 mins  Nestor Lewandowsky, OTR/L Acute Rehabilitation Services Pager: [phone] Office: [phone]   [name] 10/29/2018, 1:22 PM

## 2018-10-29 NOTE — Progress Notes (Signed)
PROGRESS NOTE    [name] [name]  QIO:[id number] DOB: 05-Mar-1951 DOA: 10/20/2018 PCP: [name], MD   Brief Narrative: [name] [name] is a 68 y.o. Female with history of morbid obesity, OSA, hypertension, hyperlipidemia, diabetes, CKD stage III.  Patient presented secondary to shortness of breath and was found to have an acute heart failure exacerbation in addition to acute respiratory failure requiring oxygen supplementation.  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  Acute respiratory failure with hypoxia Secondary to heart failure exacerbation.  Patient is going to room air.  Resolved.  Acute kidney injury on CKD stage IV Baseline creatinine of 2-2.4.  Initial AKI with creatinine peak of 2.9 which improved with diuresis.  However, with ongoing diuresis, creatinine continue to worsen and nephrology was consulted.  Patient with a new peak of 3.36 and creatinine is down today but not baseline. -Nephrology  recommendations  Diabetes mellitus, type 2 Hemoglobin A1C of 7.2%. -Continue Insulin 70-30 30 units BID, SSI  Obstructive sleep apnea -outpatient follow-up  PAD -Continue Plavix  Hypothyroidism -Continue Synthroid  Morbid obesity Body mass index is 45.28 kg/m.  Pulmonary nodule Incidental finding. Patient is a smoker. Recommendation for 3 month follow-up with repeat chest CT vs follow-up PET-CT vs tissue sampling. Patient is scheduled for pulmonology follow-up as an outpatient.  Tobacco abuse Patient counseled earlier this admission.   DVT prophylaxis: Heparin Code Status:   Code Status: Full Code Family Communication: None at bedside Disposition Plan: Discharge when kidney function improved   Consultants:   Cardiology  Nephrology  Procedures:   Transthoracic Echocardiogram (10/21/18) Study Conclusions  - Left ventricle: The cavity size was mildly dilated. Wall   thickness was normal. Systolic function was mildly reduced. The   estimated ejection fraction was in the range of 45% to 50%.   Diffuse hypokinesis. Doppler parameters are consistent with both   elevated ventricular end-diastolic filling pressure and elevated   left atrial filling pressure. - Left atrium: The atrium was moderately dilated.  Antimicrobials:  None    Subjective: Reports hallucination today. Otherwise, no issues.  Objective: Vitals:   10/29/18 0854 10/29/18 1012 10/29/18 1242 10/29/18 1928  BP:  (!) 142/87 123/68   Pulse:   77   Resp:   18   Temp:   98 F (36.7 C)   TempSrc:   Oral   SpO2: 98%  93% 94%  Weight:      Height:        Intake/Output Summary (  Last 24 hours) at 10/29/2018 2017 Last data filed at 10/29/2018 1500 Gross per 24 hour  Intake 283.53 ml  Output 701 ml  Net -417.47 ml   Filed Weights   10/27/18 0447 10/28/18 0530 10/29/18 0503  Weight: 118.9 kg 119.4 kg 119.7 kg    Examination:  General exam: Appears calm and comfortable Respiratory system:  rales at bases auscultation. Respiratory effort normal. Cardiovascular system: S1 & S2 heard, RRR. No murmurs, rubs, gallops or clicks. Gastrointestinal system: Abdomen is nondistended, soft and nontender. No organomegaly or masses felt. Normal bowel sounds heard. Central nervous system: Alert and oriented. No focal neurological deficits. Extremities: LE edema. Anterior and calf tenderness bilaterally Skin: No cyanosis. No rashes Psychiatry: Judgement and insight appear normal. Mood & affect appropriate.     Data Reviewed: I have personally reviewed following labs and imaging studies  CBC: Recent Labs  Lab 10/26/18 0450  WBC 9.6  HGB 10.9*  HCT 35.8*  MCV 86.5  PLT 767   Basic Metabolic Panel: Recent Labs  Lab 10/23/18 0555  10/25/18 0648 10/26/18 0450 10/27/18 0521 10/28/18 0432 10/29/18 0412  NA 141   < > 139 139 140 137 138  K 4.3   < > 3.8 4.0 4.1 4.2 4.2  CL 95*   < > 85* 85* 86* 87* 92*  CO2 35*   < > 42* 43* 41* 39* 37*  GLUCOSE 121*   < > 93 115* 127* 70 84  BUN 84*   < > 95* 100* 105* 104* 102*  CREATININE 2.72*   < > 2.41* 2.93* 3.02* 3.36* 3.02*  CALCIUM 10.3   < > 10.4* 10.0 10.1 10.5* 10.6*  MG 2.8*  --   --  2.7*  --   --   --   PHOS  --   --   --   --   --   --  3.6   < > = values in this interval not displayed.   GFR: Estimated Creatinine Clearance: 22.7 mL/min (A) (by C-G formula based on SCr of 3.02 mg/dL (H)). Liver Function Tests: Recent Labs  Lab 10/29/18 0412  ALBUMIN 3.4*   No results for input(s): LIPASE, AMYLASE in the last 168 hours. No results for input(s): AMMONIA in the last 168 hours. Coagulation Profile: No results for input(s): INR, PROTIME in the last 168 hours. Cardiac Enzymes: No results for input(s): CKTOTAL, CKMB, CKMBINDEX, TROPONINI in the last 168 hours. BNP (last 3 results) No results for input(s): PROBNP in the last 8760 hours. HbA1C: No results for input(s): HGBA1C in the last 72 hours. CBG: Recent Labs  Lab  10/28/18 1623 10/28/18 2049 10/29/18 0834 10/29/18 1240 10/29/18 1725  GLUCAP 99 122* 107* 175* 191*   Lipid Profile: No results for input(s): CHOL, HDL, LDLCALC, TRIG, CHOLHDL, LDLDIRECT in the last 72 hours. Thyroid Function Tests: No results for input(s): TSH, T4TOTAL, FREET4, T3FREE, THYROIDAB in the last 72 hours. Anemia Panel: Recent Labs    10/28/18 1222  FERRITIN 63  TIBC 511*  IRON 68   Sepsis Labs: No results for input(s): PROCALCITON, LATICACIDVEN in the last 168 hours.  No results found for this or any previous visit (from the past 240 hour(s)).       Radiology Studies: No results found.      Scheduled Meds: . allopurinol  100 mg Oral Daily  . calcium-vitamin D  1 tablet Oral Q breakfast  . carvedilol  3.125 mg Oral BID WC  . clopidogrel  75 mg Oral Daily  . fenofibrate  160 mg Oral Daily  . heparin injection (subcutaneous)  5,000 Units Subcutaneous Q8H  . insulin aspart  0-5 Units Subcutaneous QHS  . insulin aspart  0-9 Units Subcutaneous TID WC  . insulin aspart protamine- aspart  30 Units Subcutaneous BID WC  . ipratropium-albuterol  3 mL Nebulization BID  . levothyroxine  150 mcg Oral QAC breakfast  . mometasone-formoterol  2 puff Inhalation BID  . multivitamin with minerals  1 tablet Oral Daily  . pneumococcal 23 valent vaccine  0.5 mL Intramuscular Tomorrow-1000  . polyethylene glycol  34 g Oral Daily  . pregabalin  25 mg Oral QHS  . simvastatin  20 mg Oral QHS  . sodium chloride flush  3 mL Intravenous Q12H  . tiZANidine  4 mg Oral BID   Continuous Infusions: . sodium chloride Stopped (10/22/18 0200)     LOS: 9 days     Cordelia Poche, MD [facility] 10/29/2018, 8:17 PM  If 7PM-7AM, please contact night-coverage [email or web address]

## 2018-10-30 LAB — RENAL FUNCTION PANEL
Albumin: 3.2 g/dL — ABNORMAL LOW (ref 3.5–5.0)
Anion gap: 10 (ref 5–15)
BUN: 96 mg/dL — ABNORMAL HIGH (ref 8–23)
CO2: 32 mmol/L (ref 22–32)
Calcium: 10.1 mg/dL (ref 8.9–10.3)
Chloride: 96 mmol/L — ABNORMAL LOW (ref 98–111)
Creatinine, Ser: 2.89 mg/dL — ABNORMAL HIGH (ref 0.44–1.00)
GFR calc Af Amer: 19 mL/min — ABNORMAL LOW (ref >60)
GFR calc non Af Amer: 16 mL/min — ABNORMAL LOW (ref >60)
Glucose, Bld: 121 mg/dL — ABNORMAL HIGH (ref 70–99)
Phosphorus: 2.9 mg/dL (ref 2.5–4.6)
Potassium: 3.9 mmol/L (ref 3.5–5.1)
Sodium: 138 mmol/L (ref 135–145)

## 2018-10-30 LAB — GLUCOSE, CAPILLARY
Glucose-Capillary: 101 mg/dL — ABNORMAL HIGH (ref 70–99)
Glucose-Capillary: 132 mg/dL — ABNORMAL HIGH (ref 70–99)
Glucose-Capillary: 169 mg/dL — ABNORMAL HIGH (ref 70–99)
Glucose-Capillary: 153 mg/dL — ABNORMAL HIGH (ref 70–99)

## 2018-10-30 MED ORDER — TORSEMIDE 20 MG PO TABS
40.0000 mg | ORAL_TABLET | Freq: Every day | ORAL | Status: DC
  Administered 2018-10-30: 40 mg via ORAL
  Filled 2018-10-30: qty 2

## 2018-10-30 MED ORDER — TORSEMIDE 20 MG PO TABS
80.0000 mg | ORAL_TABLET | Freq: Every day | ORAL | Status: DC
  Administered 2018-10-30 – 2018-10-31 (×2): 80 mg via ORAL
  Filled 2018-10-30 (×2): qty 4

## 2018-10-30 NOTE — Progress Notes (Signed)
PROGRESS NOTE    [name] [name]  QIO:[id number] DOB: [birth year] DOA: 10/20/2018 PCP: [name], MD   Brief Narrative: [name] [name] is a 68 y.o. Female with history of morbid obesity, OSA, hypertension, hyperlipidemia, diabetes, CKD stage III.  Patient presented secondary to shortness of breath and was found to have an acute heart failure exacerbation in addition to acute respiratory failure requiring oxygen supplementation.  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  Acute respiratory failure with hypoxia Secondary to heart failure exacerbation.  Patient is going to room air.  Resolved.  Acute kidney injury on CKD stage IV Baseline creatinine of 2-2.4.  Initial AKI with creatinine peak of 2.9 which improved with diuresis.  However, with ongoing diuresis, creatinine continue to worsen and nephrology was consulted.  Patient with a new peak of 3.36 and creatinine continues to trend down slowly -Nephrology  recommendations: signed off  Uremia Secondary to acute kidney injury. Symptoms improving.  Diabetes mellitus, type 2 Hemoglobin A1C of 7.2%. -Continue Insulin 70-30 30 units BID -Discontinue SSI  Obstructive sleep apnea -Outpatient follow-up  PAD -Continue Plavix  Hypothyroidism -Continue Synthroid  Morbid obesity Body mass index is 45.25 kg/m.  Pulmonary nodule Incidental finding. Patient is a smoker. Recommendation for 3 month follow-up with repeat chest CT vs follow-up PET-CT vs tissue sampling. Patient is scheduled for pulmonology follow-up as an outpatient.  Tobacco abuse Patient counseled earlier this admission.   DVT prophylaxis: Heparin Code Status:   Code Status: Full Code Family Communication: None at bedside Disposition Plan: Discharge when kidney function improved and per cardiology recommendations   Consultants:   Cardiology  Nephrology  Procedures:   Transthoracic Echocardiogram (10/21/18) Study Conclusions  - Left ventricle: The cavity size was mildly dilated. Wall   thickness was normal. Systolic function was mildly reduced. The   estimated ejection fraction was in the range of 45% to 50%.   Diffuse hypokinesis. Doppler parameters are consistent with both   elevated ventricular end-diastolic filling pressure and elevated   left atrial filling pressure. - Left atrium: The atrium was moderately dilated.  Antimicrobials:  None    Subjective: Had some confusion yesterday, but is feeling a little more clear.  Objective: Vitals:   10/30/18 0550 10/30/18 0815 10/30/18 0931 10/30/18 1141  BP: (!) 127/59 117/67  (!) 121/56  Pulse: 68 68  70  Resp: 18   18  Temp: 97.6 F (36.4 C) 97.7 F (36.5 C)  97.8 F (36.6 C)  TempSrc:  Oral Oral  Oral  SpO2: 91% 95% 91% 95%  Weight: 119.6 kg     Height:        Intake/Output Summary (Last 24 hours) at 10/30/2018 1343 Last data filed at 10/30/2018 1100 Gross per 24 hour  Intake 876.53 ml    Output 500 ml  Net 376.53 ml   Filed Weights   10/28/18 0530 10/29/18 0503 10/30/18 0550  Weight: 119.4 kg 119.7 kg 119.6 kg    Examination:  General exam: Appears calm and comfortable Respiratory system: Clear to auscultation. Respiratory effort normal. Cardiovascular system: S1 & S2 heard, RRR. No murmurs, rubs, gallops or clicks. Gastrointestinal system: Abdomen is nondistended, soft and nontender. No organomegaly or masses felt. Normal bowel sounds heard. Central nervous system: Alert and oriented. No focal neurological deficits. Extremities: LE edema. No calf tenderness Skin: No cyanosis. No rashes Psychiatry: Judgement and insight appear normal. Mood & affect appropriate.   Data Reviewed: I have personally reviewed following labs and imaging studies  CBC: Recent Labs  Lab 10/26/18 0450  WBC 9.6  HGB 10.9*  HCT 35.8*  MCV 86.5  PLT 025   Basic Metabolic Panel: Recent Labs  Lab 10/26/18 0450 10/27/18 0521 10/28/18 0432 10/29/18 0412 10/30/18 0533  NA 139 140 137 138 138  K 4.0 4.1 4.2 4.2 3.9  CL 85* 86* 87* 92* 96*  CO2 43* 41* 39* 37* 32  GLUCOSE 115* 127* 70 84 121*  BUN 100* 105* 104* 102* 96*  CREATININE 2.93* 3.02* 3.36* 3.02* 2.89*  CALCIUM 10.0 10.1 10.5* 10.6* 10.1  MG 2.7*  --   --   --   --   PHOS  --   --   --  3.6 2.9   GFR: Estimated Creatinine Clearance: 23.7 mL/min (A) (by C-G formula based on SCr of 2.89 mg/dL (H)). Liver Function Tests: Recent Labs  Lab 10/29/18 0412 10/30/18 0533  ALBUMIN 3.4* 3.2*   No results for input(s): LIPASE, AMYLASE in the last 168 hours. No results for input(s): AMMONIA in the last 168 hours. Coagulation Profile: No results for input(s): INR, PROTIME in the last 168 hours. Cardiac Enzymes: No results for input(s): CKTOTAL, CKMB, CKMBINDEX, TROPONINI in the last 168 hours. BNP (last 3 results) No results for input(s): PROBNP in the last 8760 hours. HbA1C: No results for input(s): HGBA1C in the last  72 hours. CBG: Recent Labs  Lab 10/29/18 1240 10/29/18 1725 10/29/18 2135 10/30/18 0802 10/30/18 1132  GLUCAP 175* 191* 170* 101* 132*   Lipid Profile: No results for input(s): CHOL, HDL, LDLCALC, TRIG, CHOLHDL, LDLDIRECT in the last 72 hours. Thyroid Function Tests: No results for input(s): TSH, T4TOTAL, FREET4, T3FREE, THYROIDAB in the last 72 hours. Anemia Panel: Recent Labs    10/28/18 1222  FERRITIN 63  TIBC 511*  IRON 68   Sepsis Labs: No results for input(s): PROCALCITON, LATICACIDVEN in the last 168 hours.  No results found for this or any previous visit (from the past 240 hour(s)).       Radiology Studies: No results found.      Scheduled Meds: . allopurinol  100 mg Oral Daily  . calcium-vitamin D  1 tablet Oral Q breakfast  . carvedilol  3.125 mg Oral BID WC  . clopidogrel  75 mg Oral Daily  . fenofibrate  160 mg Oral Daily  . heparin injection (subcutaneous)  5,000 Units Subcutaneous Q8H  . insulin aspart  0-5 Units Subcutaneous QHS  . insulin aspart  0-9 Units  Subcutaneous TID WC  . insulin aspart protamine- aspart  30 Units Subcutaneous BID WC  . levothyroxine  150 mcg Oral QAC breakfast  . mometasone-formoterol  2 puff Inhalation BID  . multivitamin with minerals  1 tablet Oral Daily  . pneumococcal 23 valent vaccine  0.5 mL Intramuscular Tomorrow-1000  . polyethylene glycol  34 g Oral Daily  . pregabalin  25 mg Oral QHS  . simvastatin  20 mg Oral QHS  . sodium chloride flush  3 mL Intravenous Q12H  . tiZANidine  4 mg Oral BID  . torsemide  40 mg Oral Daily  . torsemide  80 mg Oral Daily   Continuous Infusions: . sodium chloride Stopped (10/22/18 0200)     LOS: 10 days     Cordelia Poche, MD [facility] 10/30/2018, 1:43 PM  If 7PM-7AM, please contact night-coverage [email or web address]

## 2018-10-30 NOTE — Progress Notes (Addendum)
Advanced Heart Failure Rounding Note  PCP-Cardiologist: No primary care provider on file.   Subjective:    Lasix and metolazone stopped 1/19. Diamox stopped 1/20. Weight unchanged. Creatinine improving 2.89 today.   Denies SOB. Swelling is a little worse. She wants to file complaints about staff.    [facility] Procedural Findings (1/20): Hemodynamics (mmHg) RA mean 9 RV 45/11 PA 42/17, mean 27 PCWP mean 18 Oxygen saturations: PA 62% AO 96% Cardiac Output (Fick) 5.79  Cardiac Index (Fick) 2.64 PVR 1.6 WU  Objective:   Weight Range: 119.6 kg Body mass index is 45.25 kg/m.   Vital Signs:   Temp:  [97.6 F (36.4 C)-98.9 F (37.2 C)] 97.6 F (36.4 C) (01/23 0550) Pulse Rate:  [68-80] 68 (01/23 0550) Resp:  [18] 18 (01/23 0550) BP: (108-142)/(46-87) 127/59 (01/23 0550) SpO2:  [91 %-98 %] 91 % (01/23 0550) Weight:  [119.6 kg] 119.6 kg (01/23 0550) Last BM Date: 10/29/18  Weight change: Filed Weights   10/28/18 0530 10/29/18 0503 10/30/18 0550  Weight: 119.4 kg 119.7 kg 119.6 kg    Intake/Output:   Intake/Output Summary (Last 24 hours) at 10/30/2018 0728 Last data filed at 10/30/2018 0552 Gross per 24 hour  Intake 283.53 ml  Output 500 ml  Net -216.47 ml      Physical Exam    General: Obese. Appears fatigued. No resp difficulty. HEENT: Normal Neck: Supple. JVP 7-8. Carotids 2+ bilat; no bruits. No thyromegaly or nodule noted. Cor: PMI nondisplaced. RRR, No M/G/R noted Lungs:clear Abdomen: Soft, non-tender, non-distended, no HSM. No bruits or masses. +BS  Extremities: No cyanosis, clubbing, or rash. R and LLE 1+ ankle edema.  Neuro: Alert & orientedx3, cranial nerves grossly intact. moves all 4 extremities w/o difficulty. Affect pleasant  Telemetry   NSR 60-70s. Personally reviewed.   EKG    No new tracings.   Labs    CBC No results for input(s): WBC, NEUTROABS, HGB, HCT, MCV, PLT in the last 72 hours. Basic Metabolic Panel Recent Labs   10/29/18 0412 10/30/18 0533  NA 138 138  K 4.2 3.9  CL 92* 96*  CO2 37* 32  GLUCOSE 84 121*  BUN 102* 96*  CREATININE 3.02* 2.89*  CALCIUM 10.6* 10.1  PHOS 3.6 2.9   Liver Function Tests Recent Labs    10/29/18 0412 10/30/18 0533  ALBUMIN 3.4* 3.2*   No results for input(s): LIPASE, AMYLASE in the last 72 hours. Cardiac Enzymes No results for input(s): CKTOTAL, CKMB, CKMBINDEX, TROPONINI in the last 72 hours.  BNP: BNP (last 3 results) Recent Labs    10/20/18 0853  BNP 500.3*    ProBNP (last 3 results) No results for input(s): PROBNP in the last 8760 hours.   D-Dimer No results for input(s): DDIMER in the last 72 hours. Hemoglobin A1C No results for input(s): HGBA1C in the last 72 hours. Fasting Lipid Panel No results for input(s): CHOL, HDL, LDLCALC, TRIG, CHOLHDL, LDLDIRECT in the last 72 hours. Thyroid Function Tests No results for input(s): TSH, T4TOTAL, T3FREE, THYROIDAB in the last 72 hours.  Invalid input(s): FREET3  Other results:   Imaging    No results found.   Medications:     Scheduled Medications: . allopurinol  100 mg Oral Daily  . calcium-vitamin D  1 tablet Oral Q breakfast  . carvedilol  3.125 mg Oral BID WC  . clopidogrel  75 mg Oral Daily  . fenofibrate  160 mg Oral Daily  . heparin injection (subcutaneous)  5,000 Units Subcutaneous Q8H  . insulin aspart  0-5 Units Subcutaneous QHS  . insulin aspart  0-9 Units Subcutaneous TID WC  . insulin aspart protamine- aspart  30 Units Subcutaneous BID WC  . ipratropium-albuterol  3 mL Nebulization BID  . levothyroxine  150 mcg Oral QAC breakfast  . mometasone-formoterol  2 puff Inhalation BID  . multivitamin with minerals  1 tablet Oral Daily  . pneumococcal 23 valent vaccine  0.5 mL Intramuscular Tomorrow-1000  . polyethylene glycol  34 g Oral Daily  . pregabalin  25 mg Oral QHS  . simvastatin  20 mg Oral QHS  . sodium chloride flush  3 mL Intravenous Q12H  . tiZANidine  4 mg  Oral BID    Infusions: . sodium chloride Stopped (10/22/18 0200)    PRN Medications: sodium chloride, acetaminophen, albuterol, calcium carbonate, fluticasone, hydrOXYzine, ondansetron (ZOFRAN) IV, oxyCODONE-acetaminophen **AND** oxyCODONE, polyvinyl alcohol, sodium chloride flush    Patient Profile   [name] [name] is a 68 y.o. female with a history of TIAs, tobacco use, HTN, DM, HL, PAD followed by Dr Oneida Alar, chronic diastolic HF, CKD IV followed by Dr Florene Glen, hypothyroidism, morbid obesity, and LBBB.  Admitted with volume overload despite high dose diuretics at home. Dr Gwenlyn Found requests that we assist with diuresis in setting of AKI.  Assessment/Plan   1. A/C combined (primary diastolic) HF - LHC 7673 with normal coronaries.  - Echo 1/15: EF 45-50%. Echo 04/2015: EF 60-65%, grade 1 DD. Echo 10/2018: EF 45-50%, diffuse HK, LA mildly dilated. RV normal  - RHC 10/27/18 with only mildly elevated filling pressures, mild pulmonary HTN, and good CO.  - Volume okay. Down 13 lbs total. Creatinine improving. - Start torsemide 80 mg am, 40 mg pm for home (she was on lasix 160 mg BID + metolazone 5 mg 3x/week). Will make sure creatinine is stable on PO diuretics prior to discharge.  - Continue coreg 3.125 mg BID - Off bidil with soft BPs. SBP 120-130s generally  2. Acute on chronic renal failure - baseline CKD IV  - Unclear baseline. No labs in epic since 04/2017, but creatinine was 2.44 at that time. - Followed with Dr Florene Glen outpatient.  - Creatinine 2.72 -> 2.68 -> 2.41 -> 2.93 -> 3.02 -> 3.36 -> 3.0 -> 2.89 - Diuretics on hold since 1/19. Resume oral diuretics today as above - Nephrology following.   3. Acute hypoxic respiratory failure - Does not wear any oxygen at home. Wearing 2 L here. Desat screen ordered.  - Finished prednisone. - No PFTs on file.   4. Tobacco use - Continues to smoke. 1 pack lasts her 3-4 days. Discussed cessation. No change.    5. Obesity - Needs  to lose weight. No change.   6. Hyperkalemia - K 3.9  7. ?OSA - Needs sleep study. Refusing CPAP. No change.   8. Hx of TIA - Continue plavix and simvastatin. No change.   Medication concerns reviewed with patient and pharmacy team. Barriers identified: none at the time.   I passed on that she had complaints to the secretary, who was already aware.   Length of Stay: [place], NP  10/30/2018, 7:28 AM  Advanced Heart Failure Team Pager [phone] (M-F; 7a - 4p)  Please contact [facility] for night-coverage after hours (4p -7a ) and weekends on [email or web address]  Patient seen with NP, agree with the above note.   Creatinine coming down now off diuretics, creatinine 2.89.  On  exam, JVP mildly elevated at 8-9 with 1+ ankle edema.  - Start torsemide today, 80 qam/40 qpm.  She was on Lasix 160 mg bid at home prior to admission. Would watch her 1 day of po diuretics to ensure creatinine stability then hopefully home Friday.   Loralie Champagne 10/30/2018 1:36 PM

## 2018-10-30 NOTE — Plan of Care (Signed)
  Problem: Education: Goal: Ability to demonstrate management of disease process will improve Outcome: Progressing   Problem: Education: Goal: Ability to verbalize understanding of medication therapies will improve Outcome: Progressing   

## 2018-10-30 NOTE — Progress Notes (Signed)
PT Cancellation Note  Patient Details Name: [name] [name] MRN: [id number] DOB: 30-May-1951   Cancelled Treatment:    Reason Eval/Treat Not Completed: Other (comment). Currently ambulating with mobility tech. Will follow-up as schedule permits.  Mabeline Caras, PT, DPT Acute Rehabilitation Services  Pager [phone] Office [employer] 10/30/2018, 10:47 AM

## 2018-10-30 NOTE — Progress Notes (Addendum)
Patient ID: [name] [name], female   DOB: Apr 07, 1951, 68 y.o.   MRN: [id number] [facility] Progress Note   Assessment/ Plan:   1.  Acute kidney injury on chronic kidney disease stage IIIb/IV: Hemodynamically mediated acute kidney injury in the setting of diuretic use for volume overload/CHF exacerbation.  Renal function improving with diuretic holiday and today restarted on oral torsemide to help augment diuresis with a suitable outpatient regimen. 2.  Acute exacerbation of congestive heart failure: With successful response to diuretics following admission/volume unloading, she continues to have pedal edema and torsemide restarted today. 3.  Anemia: Hemoglobin level/hematocrit drifting downwards, status post intravenous iron. 4.  Hypertension: Blood pressures appear to be under fair control with some transient relative hypotension noted yesterday.    Continue to monitor blood pressure and limit hypotensive episodes. 5.  Tobacco use: Discussed the importance of tobacco cessation and revisited deleterious health effects of ongoing tobacco use. 6.  Obesity: Encouraged efforts at caloric restriction/aerobic exercise for promoting weight loss.  Will sign off at this time, please call with questions or concerns.  She will follow-up as scheduled with Dr. [name].  Subjective:   Reports to be feeling better today especially upon hearing that labs look better.   Objective:   BP 117/67 (BP Location: Left Arm)   Pulse 68   Temp 97.7 F (36.5 C) (Oral)   Resp 18   Ht 5\' 4"  (1.626 m)   Wt 119.6 kg   SpO2 95%   BMI 45.25 kg/m   Intake/Output Summary (Last 24 hours) at 10/30/2018 0910 Last data filed at 10/30/2018 3474 Gross per 24 hour  Intake 283.53 ml  Output 500 ml  Net -216.47 ml   Weight change: -0.091 kg  Physical Exam: Gen: Comfortably sitting in bed, ordering lunch [employer]: Pulse regular rhythm, normal rate, S1 and S2 normal Resp: Clear to auscultation, no  rales/rhonchi Abd: Soft, obese, nontender Ext: 2+ edema lower extremity edema  Imaging: No results found.  Labs: BMET Recent Labs  Lab 10/24/18 0408 10/25/18 2595 10/26/18 0450 10/27/18 0521 10/28/18 0432 10/29/18 0412 10/30/18 0533  NA 139 139 139 140 137 138 138  K 3.8 3.8 4.0 4.1 4.2 4.2 3.9  CL 91* 85* 85* 86* 87* 92* 96*  CO2 40* 42* 43* 41* 39* 37* 32  GLUCOSE 157* 93 115* 127* 70 84 121*  BUN 90* 95* 100* 105* 104* 102* 96*  CREATININE 2.68* 2.41* 2.93* 3.02* 3.36* 3.02* 2.89*  CALCIUM 9.8 10.4* 10.0 10.1 10.5* 10.6* 10.1  PHOS  --   --   --   --   --  3.6 2.9   CBC Recent Labs  Lab 10/26/18 0450  WBC 9.6  HGB 10.9*  HCT 35.8*  MCV 86.5  PLT 366    Medications:    . allopurinol  100 mg Oral Daily  . calcium-vitamin D  1 tablet Oral Q breakfast  . carvedilol  3.125 mg Oral BID WC  . clopidogrel  75 mg Oral Daily  . fenofibrate  160 mg Oral Daily  . heparin injection (subcutaneous)  5,000 Units Subcutaneous Q8H  . insulin aspart  0-5 Units Subcutaneous QHS  . insulin aspart  0-9 Units Subcutaneous TID WC  . insulin aspart protamine- aspart  30 Units Subcutaneous BID WC  . ipratropium-albuterol  3 mL Nebulization BID  . levothyroxine  150 mcg Oral QAC breakfast  . mometasone-formoterol  2 puff Inhalation BID  . multivitamin with minerals  1 tablet  Oral Daily  . pneumococcal 23 valent vaccine  0.5 mL Intramuscular Tomorrow-1000  . polyethylene glycol  34 g Oral Daily  . pregabalin  25 mg Oral QHS  . simvastatin  20 mg Oral QHS  . sodium chloride flush  3 mL Intravenous Q12H  . tiZANidine  4 mg Oral BID  . torsemide  40 mg Oral Daily  . torsemide  80 mg Oral Daily   Elmarie Shiley, MD 10/30/2018, 9:10 AM

## 2018-10-30 NOTE — Care Management Important Message (Signed)
Important Message  Patient Details  Name: [name] [name] MRN: [id number] Date of Birth: [birth year]   Medicare Important Message Given:  Yes    Rane Dumm P [place] 10/30/2018, 11:11 AM

## 2018-10-31 LAB — RENAL FUNCTION PANEL
Albumin: 2.9 g/dL — ABNORMAL LOW (ref 3.5–5.0)
Anion gap: 11 (ref 5–15)
BUN: 96 mg/dL — ABNORMAL HIGH (ref 8–23)
CO2: 29 mmol/L (ref 22–32)
Calcium: 9.8 mg/dL (ref 8.9–10.3)
Chloride: 98 mmol/L (ref 98–111)
Creatinine, Ser: 2.92 mg/dL — ABNORMAL HIGH (ref 0.44–1.00)
GFR calc Af Amer: 18 mL/min — ABNORMAL LOW (ref >60)
GFR calc non Af Amer: 16 mL/min — ABNORMAL LOW (ref >60)
Glucose, Bld: 101 mg/dL — ABNORMAL HIGH (ref 70–99)
Phosphorus: 2.9 mg/dL (ref 2.5–4.6)
Potassium: 4 mmol/L (ref 3.5–5.1)
Sodium: 138 mmol/L (ref 135–145)

## 2018-10-31 LAB — GLUCOSE, CAPILLARY: Glucose-Capillary: 91 mg/dL (ref 70–99)

## 2018-10-31 MED ORDER — CARVEDILOL 3.125 MG PO TABS: 3.1250 mg | ORAL_TABLET | Freq: Two times a day (BID) | ORAL | 0 refills | Status: DC

## 2018-10-31 MED ORDER — TORSEMIDE 20 MG PO TABS: ORAL_TABLET | ORAL | 0 refills | Status: DC

## 2018-10-31 NOTE — Discharge Instructions (Signed)
[place] were treated for heart failure. Your medications have been adjusted. Please follow-up with the heart doctor and the kidney doctor. You had some hallucinations. This may be medication related. Your Lyrica has been discontinued.

## 2018-10-31 NOTE — Discharge Summary (Signed)
Physician Discharge Summary  [name]:[id number] DOB: 1950/12/26 DOA: 10/20/2018  PCP: [name], MD  Admit date: 10/20/2018 Discharge date: 10/31/2018  Admitted From: Home Disposition: Home  Recommendations for Outpatient Follow-up:  1. Follow up with PCP in 1 week 2. Follow up with cardiology and nephrology 3. Please obtain BMP/CBC in one week 4. Please follow up on the following pending results: None  Home Health: PT, RN Equipment/Devices: None  Discharge Condition: Stable CODE STATUS: Full code Diet recommendation: Heart healthy/renal/carb modified  Brief/Interim Summary:  Admission HPI written by [name], MD   Chief Complaint: Shortness of breath worse in the past couple of weeks  HPI: [name] [name] is a 68 y.o. female with medical history significant of diabetes type II, chronic kidney disease stage III, hyperlipidemia, hypertension, and hypothyroidism and obstructive sleep apnea who presents to the emergency department for evaluation of dyspnea.  She has had gradually worsening shortness of breath over the past 2 weeks.  She has had no cough.  She complains of chest congestions but no sputum production she has had no chest pain or fever.  Symptoms are worse with exertion and significantly worse with lying flat she has now developed 3 pillow orthopnea and significant PND.  She is waking up every night breath.  Seated symptoms include worsening lower extremity edema for the past week.  She has taken her regular breathing treatments at home without any improvement.  Patient is also complaining of associated right arm pain that she says is coming from the swelling.  ED Course: X-ray shows no acute cardiopulmonary abnormality but on my review there is cardiomegaly with increase in volume when compared to 2016.  EKG is unremarkable troponin is 0.10, BNP is elevated at 500 previously was measured at 308 years ago, creatinine is 1.97 which appears to  be somewhere near her baseline.  Blood pressure is markedly elevated.  Is improved in the ER from 175/83 on presentation to 157/81.  Patient to be admitted to the hospital for further evaluation and management of acute systolic congestive heart failure.    Hospital course:  Acute on chronic combined systolic and diastolic heart failure Echo obtained on January 14 significant for an EF of 45 to 50%, patient has been diuresed with Lasix drip per heart failure team and is been held secondary to worsening AKI. AKI improved and patient started on oral torsemide for discharge.  Acute respiratory failure with hypoxia Secondary to heart failure exacerbation.  Patient is going to room air.  Resolved.  Acute kidney injury on CKD stage IV Baseline creatinine of 2-2.4.  Initial AKI with creatinine peak of 2.9 which improved with diuresis.  However, with ongoing diuresis, creatinine continue to worsen and nephrology was consulted.  Patient with a new peak of 3.36 and creatinine continues to trend down. Nephrology signed off. Outpatient follow-up.  Uremia Secondary to acute kidney injury. Symptoms improving. Discussed with nephrology and will follow-up outpatient.  Diabetes mellitus, type 2 Hemoglobin A1C of 7.2%. Continue Insulin 70-30 on discharge.  Obstructive sleep apnea Outpatient follow-up  PAD Continue Plavix  Hypothyroidism Continue Synthroid  Morbid obesity Body mass index is 45.25 kg/m.  Pulmonary nodule Incidental finding. Patient is a smoker. Recommendation for 3 month follow-up with repeat chest CT vs follow-up PET-CT vs tissue sampling. Patient is scheduled for pulmonology follow-up as an outpatient.  Tobacco abuse Patient counseled earlier this admission.  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, unspecified extremity (HCC)   Hypertensive heart and renal disease, stage 1-4 or unspecified chronic kidney disease, with heart failure (HCC)   CHF (congestive heart failure), NYHA class III, acute on chronic, systolic (HCC)   AKI (acute kidney injury) [employer])    Discharge Instructions  Discharge Instructions    Increase activity slowly   Complete by:  As directed      Allergies as of 10/31/2018      Reactions   Nebivolol Swelling   Ace Inhibitors Swelling, Other (See Comments)   Tongue swell   Bystolic [nebivolol Hcl] Swelling, Other (See Comments)   Chest pain    Morphine And Related Itching      Medication List    STOP taking these medications   furosemide 80 MG tablet Commonly known as:  LASIX   MAGNESIUM PO   metolazone 2.5 MG tablet Commonly known as:  ZAROXOLYN   potassium chloride SA 20 MEQ tablet Commonly known as:  K-DUR,KLOR-CON     TAKE these medications   albuterol (2.5 MG/3ML) 0.083% nebulizer solution Commonly known as:  PROVENTIL Take 2.5 mg by nebulization every 6 (six) hours as needed for wheezing or shortness of breath.   allopurinol 100 MG tablet Commonly known as:  ZYLOPRIM Take 100 mg by mouth every morning.   budesonide-formoterol 160-4.5 MCG/ACT inhaler Commonly known as:  SYMBICORT Inhale 2 puffs into the lungs 2 (two) times daily as needed (for SOB).   CALCIUM 1200 PO Take 1,200 mg by mouth daily.   carvedilol 3.125 MG tablet Commonly known as:  COREG Take 1 tablet (3.125 mg total) by mouth 2 (two) times daily with a meal.   cetirizine 10 MG tablet Commonly known as:  ZYRTEC Take 10 mg by mouth daily as needed for allergies.   clopidogrel 75 MG tablet Commonly known as:  PLAVIX TAKE 1 TABLET BY MOUTH EVERY DAY     Dulaglutide 1.5 MG/0.5ML Sopn Inject 1.5 mg into the skin every Monday.   fenofibrate 145 MG tablet Commonly known as:  TRICOR TAKE 1 TABLET (145 MG TOTAL) BY MOUTH DAILY. What changed:  See the new instructions.   fluticasone 50 MCG/ACT nasal spray Commonly known as:  FLONASE Place 2 sprays into the nose as needed for allergies.   hydrOXYzine 25 MG tablet Commonly known as:  ATARAX/VISTARIL Take 25 mg by mouth 2 (two) times daily as needed for itching.   insulin lispro protamine-lispro (75-25) 100 UNIT/ML Susp injection Commonly known as:  HUMALOG 75/25 MIX Inject 40 Units into the skin 2 (two) times daily with a meal.   levothyroxine 150 MCG tablet Commonly known as:  SYNTHROID, LEVOTHROID Take 150 mcg by mouth daily before breakfast.   multivitamin with minerals Tabs tablet Take 1 tablet by mouth daily.   MYRBETRIQ 50 MG Tb24 tablet Generic drug:  mirabegron ER Take 50 mg by mouth daily.   naftifine 1 % cream Commonly known as:  NAFTIN APPLY TO THE AFFECTED AREA ON SKIN EVERY DAY What changed:  See the new instructions.   oxyCODONE-acetaminophen 10-325 MG tablet Commonly known as:  PERCOCET Take 1 tablet by mouth every 4 (four) hours as  needed for pain.   polyvinyl alcohol 1.4 % ophthalmic solution Commonly known as:  LIQUIFILM TEARS Place 2 drops into both eyes daily as needed for dry eyes.   simvastatin 20 MG tablet Commonly known as:  ZOCOR Take 20 mg by mouth at bedtime.   tiZANidine 4 MG capsule Commonly known as:  ZANAFLEX Take 4 mg by mouth 2 (two) times daily.   torsemide 20 MG tablet Commonly known as:  DEMADEX Take 4 tablets (80 mg total) by mouth every morning AND 2 tablets (40 mg total) every evening.   VITAMIN B COMPLEX PO Take 1 tablet by mouth 2 (two) times daily.      [facility] Follow up.   Why:  They will do your home health care at your home Contact information: [place] [address] [phone]        Rigoberto Noel, MD Follow up.   Specialty:  Pulmonary Disease Why:  Appointment on January 30th, at 11:15 AM Contact information: [place] [address] [phone]        [facility]. Go on 11/14/2018.   Specialty:  Cardiology Why:  at 0830 in the [facility].  Please bring all medications to appt.  Gate code is [phone]. Contact information: [address] [phone] [place]       [name], MD. Go on 11/07/2018.   Specialty:  Internal Medicine Why:  @12 :00am Contact information: [place] [address] [phone]          Allergies  Allergen Reactions  . Nebivolol Swelling  . Ace Inhibitors Swelling and Other (See Comments)    Tongue swell  . Bystolic [Nebivolol Hcl] Swelling and Other (See Comments)    Chest pain   . Morphine And Related Itching    Consultations:  Cardiology  Nephrology   Procedures/Studies: Dg Chest 1 View  Result Date: 10/20/2018 CLINICAL DATA:  Dyspnea EXAM: CHEST  1 VIEW COMPARISON:  04/17/2015 FINDINGS: Cardiac shadow is enlarged but stable. Aortic calcifications are again seen. The lungs are well aerated bilaterally without focal infiltrate or sizable effusion. No acute bony abnormality is seen. IMPRESSION: No acute abnormality noted. Electronically Signed   By: Inez Catalina M.D.   On: 10/20/2018 09:15   Dg Chest 2 View  Result Date: 10/21/2018 CLINICAL DATA:  Congestive heart failure. EXAM: CHEST - 2 VIEW COMPARISON:  Radiograph of October 20, 2018. FINDINGS: Stable cardiomegaly. Atherosclerosis of thoracic aorta is noted. No pneumothorax is noted. No acute pulmonary disease is noted. Bony thorax is unremarkable. IMPRESSION: Stable cardiomegaly.  No acute cardiopulmonary abnormality seen. Aortic Atherosclerosis (ICD10-I70.0).  Electronically Signed   By: Marijo Conception, M.D.   On: 10/21/2018 07:43   Ct Chest Wo Contrast  Result Date: 10/20/2018 CLINICAL DATA:  Shortness of breath, cough. EXAM: CT CHEST WITHOUT CONTRAST TECHNIQUE: Multidetector CT imaging of the chest was performed following the standard protocol without IV contrast. COMPARISON:  CT scan of April 08, 2015. FINDINGS: Cardiovascular: Atherosclerosis of thoracic aorta is noted without aneurysm formation. Mild cardiomegaly is noted. No pericardial effusion is noted. Mild coronary artery calcifications are noted. Mediastinum/Nodes: No enlarged mediastinal or axillary lymph nodes. Thyroid gland, trachea, and esophagus demonstrate no significant findings. Lungs/Pleura: No pneumothorax or pleural effusion is noted. 11 x 10 mm nodule is seen in left lower  lobe best seen on image number 89 of series 4. This was not present on prior exam and is concerning for possible neoplasm. 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 Upper Abdomen: No acute abnormality. Musculoskeletal: No chest wall mass or suspicious bone lesions identified. IMPRESSION: Interval development of 11 mm nodule seen in left lower lobe. This is concerning for possible neoplasm or malignancy. Consider one of the following in 3 months for both low-risk and high-risk individuals: (a) repeat chest CT, (b) follow-up PET-CT, or (c) tissue sampling. This recommendation follows the consensus statement: Guidelines for Management of Incidental Pulmonary Nodules Detected on CT Images: From the Fleischner Society 2017; Radiology 2017; [id number]:[id number]. Mild coronary artery calcifications are noted. Aortic Atherosclerosis (ICD10-I70.0). Electronically Signed   By: Marijo Conception, M.D.   On: 10/20/2018 13:18   Vas [name] Lower Extremity Venous (dvt) (only Mc & Wl 7a-7p)  Result Date: 10/20/2018  Lower Venous Study Indications: SOB, and Edema. Other Indications: Morbid obesity. Risk Factors: Tobacco abuse, PAD.  Limitations: Body habitus and patient would not allow compression. Comparison Study: No prior study on file Performing Technologist: Sharion Dove RVS  Examination Guidelines: A complete evaluation includes B-mode imaging, spectral Doppler, color Doppler, and power Doppler as needed of all accessible portions of each vessel. Bilateral testing is considered an integral part of a complete examination. Limited examinations for reoccurring indications may be performed as noted.  Right Venous Findings: +---------+---------------+---------+-----------+----------+--------------+          CompressibilityPhasicitySpontaneityPropertiesSummary        +---------+---------------+---------+-----------+----------+--------------+ CFV                     Yes      Yes                                 +---------+---------------+---------+-----------+----------+--------------+ FV Prox  Full           Yes      Yes                                 +---------+---------------+---------+-----------+----------+--------------+ FV Mid                  Yes      Yes                  visualized     +---------+---------------+---------+-----------+----------+--------------+ FV Distal               Yes      Yes                  visualized     +---------+---------------+---------+-----------+----------+--------------+ PFV                                                   Not visualized +---------+---------------+---------+-----------+----------+--------------+ POP      Full           Yes      Yes                                 +---------+---------------+---------+-----------+----------+--------------+ PTV  Not visualized +---------+---------------+---------+-----------+----------+--------------+ PERO                                                  Not visualized +---------+---------------+---------+-----------+----------+--------------+   Left Venous Findings: +---------+---------------+---------+-----------+----------+--------------+          CompressibilityPhasicitySpontaneityPropertiesSummary        +---------+---------------+---------+-----------+----------+--------------+ CFV                     Yes      Yes                  visualized     +---------+---------------+---------+-----------+----------+--------------+ FV Prox                 Yes      Yes                  visualized     +---------+---------------+---------+-----------+----------+--------------+ FV Mid                  Yes      Yes                  visualized     +---------+---------------+---------+-----------+----------+--------------+ FV Distal               Yes      Yes                  visualized     +---------+---------------+---------+-----------+----------+--------------+ PFV                                                   Not visualized +---------+---------------+---------+-----------+----------+--------------+ POP      Full           Yes      Yes                                 +---------+---------------+---------+-----------+----------+--------------+ PTV                                                   visualized     +---------+---------------+---------+-----------+----------+--------------+ PERO                                                  Not visualized +---------+---------------+---------+-----------+----------+--------------+    Summary: Right: There is no evidence of deep vein thrombosis in the lower extremity. However, portions of this examination were limited- see technologist comments above. Left: There is no evidence of deep vein thrombosis in the lower extremity. However, portions of this examination were limited- see technologist comments above.  *See table(s) above for measurements and observations. Electronically signed by Deitra Mayo MD on 10/20/2018 at 5:11:00 PM.    Final       Transthoracic Echocardiogram (1/14/220) Study Conclusions  - Left ventricle: The cavity size was mildly dilated. Wall   thickness was normal. Systolic function was  mildly reduced. The   estimated ejection fraction was in the range of 45% to 50%.   Diffuse hypokinesis. Doppler parameters are consistent with both   elevated ventricular end-diastolic filling pressure and elevated   left atrial filling pressure. - Left atrium: The atrium was moderately dilated.   Subjective: No issues this morning.  Discharge Exam: Vitals:   10/30/18 2008 10/31/18 0555  BP: (!) 128/54 123/68  Pulse: 76 81  Resp: 18 18  Temp: 98.4 F (36.9 C) 97.8 F (36.6 C)  SpO2: 93% 96%   Vitals:   10/30/18 1908 10/30/18 2008 10/31/18 0257 10/31/18 0555  BP:  (!) 128/54  123/68  Pulse:  76  81  Resp:  18  18  Temp:  98.4 F (36.9 C)  97.8 F (36.6 C)  TempSrc:  Oral  Oral  SpO2: 96% 93%  96%  Weight:   119.4 kg   Height:        General: Pt is alert, awake, not in acute distress Cardiovascular: RRR, S1/S2 +, no rubs, no gallops Respiratory: CTA bilaterally, no wheezing, no rhonchi Abdominal: Soft, NT, ND, bowel sounds + Extremities: LE edema, no cyanosis    The results of significant diagnostics from this hospitalization (including imaging, microbiology, ancillary and laboratory) are listed below for reference.     Microbiology: No results found for this or any previous visit (from the past 240 hour(s)).   Labs: BNP (last 3 results) Recent Labs    10/20/18 0853  BNP 109.6*   Basic Metabolic Panel: Recent Labs  Lab 10/26/18 0450 10/27/18 0521 10/28/18 0432 10/29/18 0412 10/30/18 0533 10/31/18 0524  NA 139 140 137 138 138 138  K 4.0 4.1 4.2 4.2 3.9 4.0  CL 85* 86* 87* 92* 96* 98  CO2 43* 41* 39* 37* 32 29  GLUCOSE 115* 127* 70 84 121* 101*  BUN 100* 105* 104* 102* 96* 96*  CREATININE 2.93* 3.02* 3.36* 3.02* 2.89* 2.92*  CALCIUM 10.0 10.1 10.5* 10.6* 10.1 9.8  MG 2.7*   --   --   --   --   --   PHOS  --   --   --  3.6 2.9 2.9   Liver Function Tests: Recent Labs  Lab 10/29/18 0412 10/30/18 0533 10/31/18 0524  ALBUMIN 3.4* 3.2* 2.9*   No results for input(s): LIPASE, AMYLASE in the last 168 hours. No results for input(s): AMMONIA in the last 168 hours. CBC: Recent Labs  Lab 10/26/18 0450  WBC 9.6  HGB 10.9*  HCT 35.8*  MCV 86.5  PLT 366   Cardiac Enzymes: No results for input(s): CKTOTAL, CKMB, CKMBINDEX, TROPONINI in the last 168 hours. BNP: Invalid input(s): POCBNP CBG: Recent Labs  Lab 10/30/18 0802 10/30/18 1132 10/30/18 1620 10/30/18 2113 10/31/18 0906  GLUCAP 101* 132* 169* 153* 91   D-Dimer No results for input(s): DDIMER in the last 72 hours. Hgb A1c No results for input(s): HGBA1C in the last 72 hours. Lipid Profile No results for input(s): CHOL, HDL, LDLCALC, TRIG, CHOLHDL, LDLDIRECT in the last 72 hours. Thyroid function studies No results for input(s): TSH, T4TOTAL, T3FREE, THYROIDAB in the last 72 hours.  Invalid input(s): FREET3 Anemia work up Recent Labs    10/28/18 1222  FERRITIN 63  TIBC 511*  IRON 68   Urinalysis    Component Value Date/Time   COLORURINE YELLOW 10/20/2018 [place] (A) 10/20/2018 [place]   LABSPEC 1.011 10/20/2018 [place] 5.0 10/20/2018 0454  GLUCOSEU NEGATIVE 10/20/2018 [place] 10/20/2018 [place] 10/20/2018 [place] 10/20/2018 0853   PROTEINUR 30 (A) 10/20/2018 0853   UROBILINOGEN 0.2 08/04/2014 1409   NITRITE NEGATIVE 10/20/2018 0853   LEUKOCYTESUR NEGATIVE 10/20/2018 1225     SIGNED:   Cordelia Poche, MD [facility] 10/31/2018, 12:14 PM

## 2018-10-31 NOTE — Progress Notes (Addendum)
Advanced Heart Failure Rounding Note  PCP-Cardiologist: No primary care provider on file.   Subjective:    PO torsemide started yesterday. Good diuresis with net negative 750 mls (lots of PO intake). Creatinine stable 2.89 -> 2.92. Weight unchanged.   Feels fine. Walking in hallways with no SOB. Wants to go home.   RHC Procedural Findings (1/20): Hemodynamics (mmHg) RA mean 9 RV 45/11 PA 42/17, mean 27 PCWP mean 18 Oxygen saturations: PA 62% AO 96% Cardiac Output (Fick) 5.79  Cardiac Index (Fick) 2.64 PVR 1.6 WU  Objective:   Weight Range: 119.4 kg Body mass index is 45.18 kg/m.   Vital Signs:   Temp:  [97.7 F (36.5 C)-98.4 F (36.9 C)] 97.8 F (36.6 C) (01/24 0555) Pulse Rate:  [68-82] 81 (01/24 0555) Resp:  [16-18] 18 (01/24 0555) BP: (117-144)/(54-71) 123/68 (01/24 0555) SpO2:  [91 %-99 %] 96 % (01/24 0555) Weight:  [119.4 kg] 119.4 kg (01/24 0257) Last BM Date: 10/29/18  Weight change: Filed Weights   10/29/18 0503 10/30/18 0550 10/31/18 0257  Weight: 119.7 kg 119.6 kg 119.4 kg    Intake/Output:   Intake/Output Summary (Last 24 hours) at 10/31/2018 0732 Last data filed at 10/31/2018 0600 Gross per 24 hour  Intake 1793 ml  Output 2550 ml  Net -757 ml      Physical Exam    General: Obese. No resp difficulty. HEENT: Normal Neck: Supple. JVP 7-8. Carotids 2+ bilat; no bruits. No thyromegaly or nodule noted. Cor: PMI nondisplaced. RRR, No M/G/R noted Lungs: CTAB, normal effort. Abdomen: Soft, non-tender, non-distended, no HSM. No bruits or masses. +BS  Extremities: No cyanosis, clubbing, or rash. R and LLE 1+ ankle edema Neuro: Alert & orientedx3, cranial nerves grossly intact. moves all 4 extremities w/o difficulty. Affect pleasant  Telemetry   NSR 70s. Personally reviewed.   EKG    No new tracings.   Labs    CBC No results for input(s): WBC, NEUTROABS, HGB, HCT, MCV, PLT in the last 72 hours. Basic Metabolic Panel Recent Labs      10/30/18 0533 10/31/18 0524  NA 138 138  K 3.9 4.0  CL 96* 98  CO2 32 29  GLUCOSE 121* 101*  BUN 96* 96*  CREATININE 2.89* 2.92*  CALCIUM 10.1 9.8  PHOS 2.9 2.9   Liver Function Tests Recent Labs    10/30/18 0533 10/31/18 0524  ALBUMIN 3.2* 2.9*   No results for input(s): LIPASE, AMYLASE in the last 72 hours. Cardiac Enzymes No results for input(s): CKTOTAL, CKMB, CKMBINDEX, TROPONINI in the last 72 hours.  BNP: BNP (last 3 results) Recent Labs    10/20/18 0853  BNP 500.3*    ProBNP (last 3 results) No results for input(s): PROBNP in the last 8760 hours.   D-Dimer No results for input(s): DDIMER in the last 72 hours. Hemoglobin A1C No results for input(s): HGBA1C in the last 72 hours. Fasting Lipid Panel No results for input(s): CHOL, HDL, LDLCALC, TRIG, CHOLHDL, LDLDIRECT in the last 72 hours. Thyroid Function Tests No results for input(s): TSH, T4TOTAL, T3FREE, THYROIDAB in the last 72 hours.  Invalid input(s): FREET3  Other results:   Imaging    No results found.   Medications:     Scheduled Medications: . allopurinol  100 mg Oral Daily  . calcium-vitamin D  1 tablet Oral Q breakfast  . carvedilol  3.125 mg Oral BID WC  . clopidogrel  75 mg Oral Daily  . fenofibrate  160 mg  Oral Daily  . heparin injection (subcutaneous)  5,000 Units Subcutaneous Q8H  . insulin aspart protamine- aspart  30 Units Subcutaneous BID WC  . levothyroxine  150 mcg Oral QAC breakfast  . mometasone-formoterol  2 puff Inhalation BID  . multivitamin with minerals  1 tablet Oral Daily  . pneumococcal 23 valent vaccine  0.5 mL Intramuscular Tomorrow-1000  . polyethylene glycol  34 g Oral Daily  . pregabalin  25 mg Oral QHS  . simvastatin  20 mg Oral QHS  . sodium chloride flush  3 mL Intravenous Q12H  . tiZANidine  4 mg Oral BID  . torsemide  40 mg Oral Daily  . torsemide  80 mg Oral Daily    Infusions: . sodium chloride Stopped (10/22/18 0200)    PRN  Medications: sodium chloride, acetaminophen, albuterol, calcium carbonate, fluticasone, hydrOXYzine, ondansetron (ZOFRAN) IV, oxyCODONE-acetaminophen **AND** oxyCODONE, polyvinyl alcohol, sodium chloride flush    Patient Profile   [name] [name] is a 68 y.o. female with a history of TIAs, tobacco use, HTN, DM, HL, PAD followed by Dr Oneida Alar, chronic diastolic HF, CKD IV followed by Dr Florene Glen, hypothyroidism, morbid obesity, and LBBB.  Admitted with volume overload despite high dose diuretics at home. Dr Gwenlyn Found requests that we assist with diuresis in setting of AKI.  Assessment/Plan   1. A/C combined (primary diastolic) HF - LHC 8242 with normal coronaries.  - Echo 1/15: EF 45-50%. Echo 04/2015: EF 60-65%, grade 1 DD. Echo 10/2018: EF 45-50%, diffuse HK, LA mildly dilated. RV normal  - RHC 10/27/18 with only mildly elevated filling pressures, mild pulmonary HTN, and good CO.  - Volume status okay. Down 13 lbs total.  - Continue torsemide 80 mg am, 40 mg pm for home. Creatinine stable on this regimen. - Continue coreg 3.125 mg BID - Off bidil with soft BPs. SBP 120s generally - Discussed limiting fluid intake. Also discussed how to get to HF clinic for follow up.   2. Acute on chronic renal failure - baseline CKD IV  - Unclear baseline. No labs in epic since 04/2017, but creatinine was 2.44 at that time. - Followed with Dr Florene Glen outpatient.  - Creatinine 2.72 -> 2.68 -> 2.41 -> 2.93 -> 3.02 -> 3.36 -> 3.0 -> 2.89 -> 2.92 - Stable with starting torsemide.  - Nephrology following.   3. Acute hypoxic respiratory failure - Does not wear any oxygen at home. Back on RA - Finished prednisone. - No PFTs on file.   4. Tobacco use - Continues to smoke. 1 pack lasts her 3-4 days. Discussed cessation. No change.   5. Obesity - Needs to lose weight. No change.   6. Hyperkalemia - K 4.0  7. ?OSA - Needs sleep study. Refusing CPAP. No change.    8. Hx of TIA - Continue plavix  and simvastatin. No change.   Medication concerns reviewed with patient and pharmacy team. Barriers identified: none at the time.    Heart failure team will sign off as of 10/31/18  HF Team Medication Recommendations for Home: Coreg 3.125 mg BID Plavix 75 mg daily Simvastatin 20 mg daily Torsemide 80 mg am, 40 mg pm NO metolazone.   Other recommendations (Labs,testing, etc): BMET at follow up.   Follow up as an outpatient: 11/14/18 at 8:30 am in HF clinic.    Length of Stay: [address] [place]  10/31/2018, 7:32 AM  Advanced Heart Failure Team Pager [phone] (M-F; 7a - 4p)  Please contact [facility] for night-coverage after hours (4p -7a ) and weekends on [email or web address]  Patient seen with NP, agree with the above note.    BUN/creatinine elevated but stable on current diuretic regimen. Volume status looks stable/near-euvolemic.  I think she can go home today on the above regimen, I do not think she needs further acute care in the hospital but will need close followup in CHF clinic and with nephrology.   We will sign off.   Loralie Champagne 10/31/2018 10:52 AM

## 2018-10-31 NOTE — Progress Notes (Signed)
Physical Therapy Discharge Patient Details Name: APOORVA BUGAY MRN: [id number] DOB: March 11, 1951 Today's Date: 10/31/2018 Time:  -     Patient discharged from PT services secondary to goals met and no further PT needs identified. Ambulating hallway mod indep with rollator. No SOB; SpO2 >90% on RA per mobility tech. Pt not interested in stair training during PT treatment sessions.  Please see latest therapy progress note for current level of functioning and progress toward goals.    Progress and discharge plan discussed with patient and/or caregiver: Patient/Caregiver agrees with plan  GP    Mabeline Caras, PT, DPT Acute Rehabilitation Services  Pager [phone] Office [place] 10/31/2018, 1:22 PM

## 2018-11-06 ENCOUNTER — Encounter: Payer: Self-pay | Admitting: Pulmonary Disease

## 2018-11-06 ENCOUNTER — Ambulatory Visit (INDEPENDENT_AMBULATORY_CARE_PROVIDER_SITE_OTHER): Payer: Medicare Other | Admitting: Pulmonary Disease

## 2018-11-06 ENCOUNTER — Institutional Professional Consult (permissible substitution): Payer: Medicare Other | Admitting: Pulmonary Disease

## 2018-11-06 DIAGNOSIS — R911 Solitary pulmonary nodule: Secondary | ICD-10-CM | POA: Diagnosis not present

## 2018-11-06 DIAGNOSIS — I5023 Acute on chronic systolic (congestive) heart failure: Secondary | ICD-10-CM

## 2018-11-06 DIAGNOSIS — R918 Other nonspecific abnormal finding of lung field: Secondary | ICD-10-CM

## 2018-11-06 DIAGNOSIS — J4530 Mild persistent asthma, uncomplicated: Secondary | ICD-10-CM

## 2018-11-06 NOTE — Patient Instructions (Signed)
You have a 11 mm nodule in the left lower lung. CT chest without contrast in 3 months.  Schedule PFTs.  Cardiology follow-up appointment Watch the salt in your diet. You have to quit smoking

## 2018-11-06 NOTE — Progress Notes (Signed)
Subjective:    Patient ID: [name] [name], female    DOB: 1950/10/29, 68 y.o.   MRN: [id number]  HPI   Chief Complaint  Patient presents with  . Hospitalization Follow-up    Was in the hospital for 11 days for dyspnea.     68 year old smoker, diabetic, hypertensive, presents for evaluation of pulmonary nodule noted incidentally during hospitalization. She was hospitalized 1/13-1/24 for dyspnea and diagnosed as acute systolic heart failure, EF was noted to be 45 to 50%.  He also has CKD with a baseline creatinine between 2 and 2.4.  She was diuresed CT chest without contrast was obtained 10/20/2018 and compared to that from 04/2015.  This showed a new 11 x 10 mm nodule in the left lower lobe.  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  After hospitalization she has cut down to 3 to 4 cigarettes daily Weight has decreased from 273 on hospital admit to 258 now  She denies dyspnea but reports chest pain between her shoulder blades nonradiating.  In the hospital they were using a heating pad which seem to provide some relief  Past Medical History:  Diagnosis Date  . Anemia   . Arthritis   . Asthma   . Bronchitis   . Chronic kidney disease   . Chronic kidney disease   . Chronic pain syndrome   . Cramping of feet   . Diabetes mellitus   . GERD (gastroesophageal reflux disease)   . Gout   . Headache   . History of hip replacement, total   . History of transfusion   . Hx MRSA infection    abscess left groin  . Hx of cardiac cath 06/2014  . Hyperlipidemia   . Hypertension   . Hypothyroid   . 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 Morbidly obese ([place])   . Peripheral vascular disease ([place])   . Positive  cardiac stress test 12/2013   anterior and lateral ischemia on Myoview  . Sleep apnea    UNABLE TO TOLERATE C PAP  . Stress incontinence   . Thyroid disease   . TIA (transient ischemic attack)     X2 NO RESIDUAL PROBLEMS    Past Surgical History:  Procedure Laterality Date  . ABDOMINAL HYSTERECTOMY    . COLON SURGERY  1995   DUE TO POLYP  . FOREARM FRACTURE SURGERY     Left arm  . HERNIA REPAIR     w/ mesh  . INCISION AND DRAINAGE ABSCESS Left 02/04/2013   Procedure: INCISION AND DRAINAGE LEFT BUTTOCK ABSCESS; INCISION AND DRAINAGE LEFT BREAST ABSCESS;  Surgeon: Harl Bowie, MD;  Location: [place];  Service: General;  Laterality: Left;  . INCISION AND DRAINAGE ABSCESS N/A 02/12/2013   Procedure: INCISION AND DEBRIDEMENT BUTTOCK WOUND ;  Surgeon: [name]. Georgette Dover, MD;  Location: [place];  Service: General;  Laterality: N/A;  . INCISION AND DRAINAGE ABSCESS N/A 02/14/2013   Procedure: INCISION AND DRAINAGE/DRESSING CHANGE;  Surgeon: Harl Bowie, MD;  Location: [place];  Service: General;  Laterality: N/A;  . INCISION AND DRAINAGE ABSCESS N/A 03/21/2015   Procedure: INCISION AND DRAINAGE PUBIC ABSCESS;  Surgeon: Excell Seltzer, MD;  Location: [facility];  Service:  General;  Laterality: N/A;  . INCISION AND DRAINAGE PERIRECTAL ABSCESS Left 02/10/2013   Procedure: IRRIGATION AND DEBRIDEMENT OF BUTTOCK/PERINEAL ABSCESS;  Surgeon: [name]. Georgette Dover, MD;  Location: [place];  Service: General;  Laterality: Left;  . INCISION AND DRAINAGE PERIRECTAL ABSCESS N/A 02/16/2013   Procedure: IRRIGATION AND DEBRIDEMENT PERINEAL ABSCESS;  Surgeon: Zenovia Jarred, MD;  Location: [place];  Service: General;  Laterality: N/A;  . IRRIGATION AND DEBRIDEMENT ABSCESS N/A 02/06/2013   Procedure: IRRIGATION AND DEBRIDEMENT BUTTOCK ABSCESS AND DRESSING CHANGE;  Surgeon: Harl Bowie, MD;  Location: [place];  Service: General;  Laterality: N/A;  . IRRIGATION AND DEBRIDEMENT ABSCESS Left 02/08/2013   Procedure:  IRRIGATION AND DEBRIDEMENT ABSCESS/DRESSING CHANGE;  Surgeon: Gwenyth Ober, MD;  Location: [place];  Service: General;  Laterality: Left;  . JOINT REPLACEMENT  2010 / 2012   . LAPAROSCOPIC CHOLECYSTECTOMY    . LEFT HEART CATHETERIZATION WITH CORONARY ANGIOGRAM N/A 07/02/2014   Procedure: LEFT HEART CATHETERIZATION WITH CORONARY ANGIOGRAM;  Surgeon: Lorretta Harp, MD;  Location: [facility];  Service: Cardiovascular;  Laterality: N/A;  . LOWER EXTREMITY ANGIOGRAM Right 04/20/2016   Procedure: Lower Extremity Angiogram;  Surgeon: Elam Dutch, MD;  Location: [place] CV LAB;  Service: Cardiovascular;  Laterality: Right;  . PERIPHERAL VASCULAR CATHETERIZATION N/A 04/20/2016   Procedure: Abdominal Aortogram;  Surgeon: Elam Dutch, MD;  Location: [place] [facility];  Service: Cardiovascular;  Laterality: N/A;  . PERIPHERAL VASCULAR CATHETERIZATION Right 04/20/2016   Procedure: Peripheral Vascular Intervention;  Surgeon: Elam Dutch, MD;  Location: [place] [facility];  Service: Cardiovascular;  Laterality: Right;  popiteal  . RIGHT HEART CATH N/A 10/27/2018   Procedure: RIGHT HEART CATH;  Surgeon: Larey Dresser, MD;  Location: [place] [facility];  Service: Cardiovascular;  Laterality: N/A;  . THYROIDECTOMY    . TOTAL HIP REVISION Right 08/11/2014   Procedure: RIGHT ACETABULAR REVISION;  Surgeon: Gearlean Alf, MD;  Location: [facility];  Service: Orthopedics;  Laterality: Right;    Allergies  Allergen Reactions  . Nebivolol Swelling  . Ace Inhibitors Swelling and Other (See Comments)    Tongue swell  . Bystolic [Nebivolol Hcl] Swelling and Other (See Comments)    Chest pain   . Morphine And Related Itching    Social History   Socioeconomic History  . Marital status: Legally Separated    Spouse name: Not on file  . Number of children: 1  . Years of education: college  . Highest education level: Not on file  Occupational History    Comment: Disabled  Social Needs  .  Financial resource strain: Not on file  . Food insecurity:    Worry: Not on file    Inability: Not on file  . Transportation needs:    Medical: Not on file    Non-medical: Not on file  Tobacco Use  . Smoking status: Current Some Day Smoker    Packs/day: 0.25    Years: 40.00    Pack years: 10.00    Types: Cigarettes  . Smokeless tobacco: Never Used  Substance and Sexual Activity  . Alcohol use: Yes    Alcohol/week: 1.0 standard drinks    Types: 1 Standard drinks or equivalent per week    Comment: OCC  . Drug use: No    Comment: Quit four years ago.  Marland Kitchen Sexual activity: Not on file  Lifestyle  . Physical activity:    Days per week: Not on  file    Minutes per session: Not on file  . Stress: Not on file  Relationships  . Social connections:    Talks on phone: Not on file    Gets together: Not on file    Attends religious service: Not on file    Active member of club or organization: Not on file    Attends meetings of clubs or organizations: Not on file    Relationship status: Not on file  . Intimate partner violence:    Fear of current or ex partner: Not on file    Emotionally abused: Not on file    Physically abused: Not on file    Forced sexual activity: Not on file  Other Topics Concern  . Not on file  Social History Narrative   Patient is separated  and lives at home with her friend. Patient is disabled.   Education one year of college   Right handed   Caffeine two cups daily.        Family History  Problem Relation Age of Onset  . Diabetes Brother   . Cardiomyopathy Mother   . Heart disease Mother   . Cancer Father      Review of Systems Constitutional: negative for anorexia, fevers and sweats  Eyes: negative for irritation, redness and visual disturbance  Ears, nose, mouth, throat, and face: negative for earaches, epistaxis, nasal congestion and sore throat  Respiratory: negative for cough, sputum and wheezing  Cardiovascular: negative for  orthopnea,  palpitations and syncope  Gastrointestinal: negative for abdominal pain, constipation, diarrhea, melena, nausea and vomiting  Genitourinary:negative for dysuria, frequency and hematuria  Hematologic/lymphatic: negative for bleeding, easy bruising and lymphadenopathy  Musculoskeletal:negative for arthralgias, muscle weakness and stiff joints  Neurological: negative for coordination problems, gait problems, headaches and weakness  Endocrine: negative for diabetic symptoms including polydipsia, polyuria and weight loss     Objective:   Physical Exam  Gen. Pleasant, obese, in no distress, normal affect ENT - no pallor,icterus, no post nasal drip, class 2-3 airway Neck: No JVD, no thyromegaly, no carotid bruits Lungs: no use of accessory muscles, no dullness to percussion, decreased without rales or rhonchi  Cardiovascular: Rhythm regular, heart sounds  normal, no murmurs or gallops, no peripheral edema Abdomen: soft and non-tender, no hepatosplenomegaly, BS normal. Musculoskeletal: No deformities, no cyanosis or clubbing Neuro:  alert, non focal, no tremors       Assessment & Plan:

## 2018-11-06 NOTE — Assessment & Plan Note (Signed)
Obtain PFTs to clarify whether she has COPD. Continue Symbicort meanwhile

## 2018-11-06 NOTE — Assessment & Plan Note (Signed)
Discussed sodium restriction. She is watching her weight, has renal follow-up. I asked her to make cardiology appointment since she is known to their service

## 2018-11-06 NOTE — Assessment & Plan Note (Signed)
a 11 mm nodule in the left lower lung suspicious in this smoker CT chest without contrast in 3 months.  Schedule PFTs

## 2018-11-13 NOTE — Progress Notes (Signed)
PCP: Primary Cardiologist: Dr Gwenlyn Found  Nephrology: Dr Florene Glen HF MD: Dr Aundra Dubin  HPI: [name] Fairbairn Patrickis a 68 y.o.femalewith a history of TIAs, tobacco use, HTN, DM, HL, PAD followed by Dr Oneida Alar, chronic diastolic HF, CKD IV followed by Dr Virgina Jock, morbid obesity,amoker, and LBBB.  Admitted 10/20/2018 with volume overload despite high dose diuretics at home(lasix 160 bid + metolazone 5 mg 3x/week). Prior to admit she ran out of metolazone.  Hospital course was complicated by AKI on CKD. Diuresed with IV lasix and transitioned to torsemide 80 mg /40 mg.   Today she returns for post hospital follow up. Overall feeling fine. Says she is limited with back pain.  SOB with exertion. Denies PND/Orthopnea. Appetite ok. Tries to limit salt intake and she has started limiting fluid intake to < 2 liters. No fever or chills. Weight at home 250  pounds. Taking all medications. Followed by [facility] for RN/PT. Lives with her husband.   Cardiac Test ECHO 10/2018  EF 45-50%  Echo 04/2015: EF 60-65%, grade 1 DD  LHC 07/02/14: essentially normal coronary arteries and EF of 40-45% Myoview stress test 06/24/14: high risk with anterolateral ischemia.   RHC Procedural Findings (1/20): Hemodynamics (mmHg) RA mean 9 RV 45/11 PA 42/17, mean 27 PCWP mean 18 Oxygen saturations: PA 62% AO 96% Cardiac Output (Fick) 5.79  Cardiac Index (Fick) 2.64 PVR 1.6 WU   ROS: All systems negative except as listed in HPI, PMH and Problem List.  SH:  Social History   Socioeconomic History  . Marital status: Legally Separated    Spouse name: Not on file  . Number of children: 1  . Years of education: college  . Highest education level: Not on file  Occupational History    Comment: Disabled  Social Needs  . Financial resource strain: Not on file  . Food insecurity:    Worry: Not on file    Inability: Not on file  . Transportation needs:    Medical: Not on file    Non-medical: Not on file    Tobacco Use  . Smoking status: Current Some Day Smoker    Packs/day: 0.25    Years: 40.00    Pack years: 10.00    Types: Cigarettes  . Smokeless tobacco: Never Used  Substance and Sexual Activity  . Alcohol use: Yes    Alcohol/week: 1.0 standard drinks    Types: 1 Standard drinks or equivalent per week    Comment: OCC  . Drug use: No    Comment: Quit four years ago.  Marland Kitchen Sexual activity: Not on file  Lifestyle  . Physical activity:    Days per week: Not on file    Minutes per session: Not on file  . Stress: Not on file  Relationships  . Social connections:    Talks on phone: Not on file    Gets together: Not on file    Attends religious service: Not on file    Active member of club or organization: Not on file    Attends meetings of clubs or organizations: Not on file    Relationship status: Not on file  . Intimate partner violence:    Fear of current or ex partner: Not on file    Emotionally abused: Not on file    Physically abused: Not on file    Forced sexual activity: Not on file  Other Topics Concern  . Not on file  Social History Narrative   Patient is separated  and lives  at home with her friend. Patient is disabled.   Education one year of college   Right handed   Caffeine two cups daily.       FH:  Family History  Problem Relation Age of Onset  . Diabetes Brother   . Cardiomyopathy Mother   . Heart disease Mother   . Cancer Father     Past Medical History:  Diagnosis Date  . Anemia   . Arthritis   . Asthma   . Bronchitis   . Chronic kidney disease   . Chronic kidney disease   . Chronic pain syndrome   . Cramping of feet   . Diabetes mellitus   . GERD (gastroesophageal reflux disease)   . Gout   . Headache   . History of hip replacement, total   . History of transfusion   . Hx MRSA infection    abscess left groin  . Hx of cardiac cath 06/2014  . Hyperlipidemia   . Hypertension   . Hypothyroid   . 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  Morbidly obese ([place])   . Peripheral vascular disease ([place])   . Positive cardiac stress test 12/2013   anterior and lateral ischemia on Myoview  . Sleep apnea    UNABLE TO TOLERATE C PAP  . Stress incontinence   . Thyroid disease   . TIA (transient ischemic attack)     X2 NO RESIDUAL PROBLEMS    Current Outpatient Medications  Medication Sig Dispense Refill  . albuterol (PROVENTIL) (2.5 MG/3ML) 0.083% nebulizer solution Take 2.5 mg by nebulization every 6 (six) hours as needed for wheezing or shortness of breath.    . allopurinol (ZYLOPRIM) 100 MG tablet Take 100 mg by mouth every morning.     . B Complex Vitamins (VITAMIN B COMPLEX PO) Take 1 tablet by mouth 2 (two) times daily.     . budesonide-formoterol (SYMBICORT) 160-4.5 MCG/ACT inhaler Inhale 2 puffs into the lungs 2 (two) times daily as needed (for SOB).     . Calcium Carbonate-Vit D-Min (CALCIUM 1200 PO) Take 1,200 mg by mouth daily.     . carvedilol (COREG) 3.125 MG tablet Take 1 tablet (3.125 mg total) by mouth 2 (two) times daily with a meal. 60 tablet 0  . cetirizine (ZYRTEC) 10 MG tablet Take 10 mg by mouth daily as needed for allergies.     Marland Kitchen clopidogrel (PLAVIX) 75 MG tablet TAKE 1 TABLET BY MOUTH EVERY DAY (Patient taking differently: Take 75 mg by mouth daily. ) 30 tablet 0  . Dulaglutide 1.5 MG/0.5ML SOPN Inject 1.5 mg into the skin every Monday.     . fenofibrate (TRICOR) 145 MG tablet TAKE 1 TABLET (145 MG TOTAL) BY MOUTH DAILY. (Patient taking differently: Take 145 mg by mouth daily. ) 90 tablet 2  . fluticasone (FLONASE) 50 MCG/ACT nasal spray Place 2 sprays into the nose as needed for allergies.     . hydrOXYzine (ATARAX/VISTARIL) 25 MG tablet Take 25 mg by mouth 2 (two) times daily as needed for itching.    . insulin lispro protamine-lispro (HUMALOG 75/25 MIX) (75-25) 100 UNIT/ML SUSP injection Inject 40 Units into the skin 2 (two) times daily with a meal.     . levothyroxine (SYNTHROID, LEVOTHROID) 150 MCG tablet  Take 150 mcg by mouth daily before breakfast.    . mirabegron ER (MYRBETRIQ) 50 MG TB24 tablet Take 50 mg by mouth daily.    . Multiple Vitamin (MULTIVITAMIN WITH MINERALS) TABS  tablet Take 1 tablet by mouth daily.    Marland Kitchen oxyCODONE-acetaminophen (PERCOCET) 10-325 MG tablet Take 1 tablet by mouth every 4 (four) hours as needed for pain.    . polyvinyl alcohol (LIQUIFILM TEARS) 1.4 % ophthalmic solution Place 2 drops into both eyes daily as needed for dry eyes.    . simvastatin (ZOCOR) 20 MG tablet Take 20 mg by mouth at bedtime.  1  . tiZANidine (ZANAFLEX) 4 MG capsule Take 4 mg by mouth 2 (two) times daily.     Marland Kitchen torsemide (DEMADEX) 20 MG tablet Take 4 tablets (80 mg total) by mouth every morning AND 2 tablets (40 mg total) every evening. 180 tablet 0  . naftifine (NAFTIN) 1 % cream APPLY TO THE AFFECTED AREA ON SKIN EVERY DAY (Patient not taking: No sig reported) 60 g 0   No current facility-administered medications for this encounter.    Wt Readings from Last 3 Encounters:  11/14/18 114.8 kg (253 lb)  11/06/18 117 kg (258 lb)  10/31/18 119.4 kg (263 lb 3.2 oz)     Vitals:   11/14/18 0835  BP: (!) 168/88  Pulse: 88  SpO2: 95%  Weight: 114.8 kg (253 lb)    PHYSICAL EXAM: General:  Appears chronically ill. No resp difficulty HEENT: normal Neck: supple. JVP 6-7 . Carotids 2+ bilaterally; no bruits. No lymphadenopathy or thryomegaly appreciated. Cor: PMI normal. Regular rate & rhythm. No rubs, gallops or murmurs. Lungs: clear Abdomen: obese, soft, nontender, nondistended. No hepatosplenomegaly. No bruits or masses. Good bowel sounds. Extremities: no cyanosis, clubbing, rash, edema Neuro: alert & orientedx3, cranial nerves grossly intact. Moves all 4 extremities w/o difficulty. Affect pleasant.   ASSESSMENT & PLAN: 1. Chronic combined(primary diastolic)HF - LHC 0254 with normal coronaries.Echo 10/2018: EF 45-50%, diffuse HK, LA mildly dilated. RV normal  - RHC 10/27/18 with only  mildly elevated filling pressures, mild pulmonary HTN, and good CO.  -NYHA IIIb. Volume status stable.  - Continue torsemide 80 mg am, 40 mg pm for home. I have asker her to call HF clinic if her weight is 255 pounds or greater.  - Increase coreg 6.25 mg twice a day. - Bidil added recent hospitalization but later stopped due to hypotension  2. CKD - baseline CKD IV  - Creatinine baseline 2-2.4 -Followed by [place] [facility] today.   3.. Tobacco use -Current tobacco abuse. Discussed smoking cessation.   4. Obesity Body mass index is 43.43 kg/m.  Discussed portion control.   5. ?OSA - Needs sleep study but she does not want to pursue.      6.Hx of TIA - Continue plavix and simvastatin.No change.   7. HTN Elevated. Increase coreg as noted above.   I reviewed d/c summary from 10/31/18. Check BMEt today. Continue AHC for HHRN/HHPT. Discussed limiting sodium intake and limiting fluid intake to < 2 liters per day.   Follow up in 4 weeks.    Doylene Splinter NP-C  8:40 AM

## 2018-11-14 ENCOUNTER — Ambulatory Visit (HOSPITAL_COMMUNITY)
Admission: RE | Admit: 2018-11-14 | Discharge: 2018-11-14 | Disposition: A | Discharge status: Home / Self Care | Payer: Medicare Other | Source: Ambulatory Visit | Attending: Internal Medicine | Admitting: Internal Medicine

## 2018-11-14 ENCOUNTER — Encounter (HOSPITAL_COMMUNITY): Payer: Self-pay

## 2018-11-14 ENCOUNTER — Other Ambulatory Visit: Payer: Self-pay | Admitting: Internal Medicine

## 2018-11-14 VITALS — BP 168/88 | HR 88 | Wt 253.0 lb

## 2018-11-14 DIAGNOSIS — M109 Gout, unspecified: Secondary | ICD-10-CM | POA: Insufficient documentation

## 2018-11-14 DIAGNOSIS — I5042 Chronic combined systolic (congestive) and diastolic (congestive) heart failure: Secondary | ICD-10-CM | POA: Insufficient documentation

## 2018-11-14 DIAGNOSIS — G4730 Sleep apnea, unspecified: Secondary | ICD-10-CM | POA: Diagnosis not present

## 2018-11-14 DIAGNOSIS — I272 Pulmonary hypertension, unspecified: Secondary | ICD-10-CM | POA: Diagnosis not present

## 2018-11-14 DIAGNOSIS — E1151 Type 2 diabetes mellitus with diabetic peripheral angiopathy without gangrene: Secondary | ICD-10-CM | POA: Diagnosis not present

## 2018-11-14 DIAGNOSIS — E785 Hyperlipidemia, unspecified: Secondary | ICD-10-CM | POA: Insufficient documentation

## 2018-11-14 DIAGNOSIS — E669 Obesity, unspecified: Secondary | ICD-10-CM

## 2018-11-14 DIAGNOSIS — E1122 Type 2 diabetes mellitus with diabetic chronic kidney disease: Secondary | ICD-10-CM | POA: Diagnosis not present

## 2018-11-14 DIAGNOSIS — N184 Chronic kidney disease, stage 4 (severe): Secondary | ICD-10-CM | POA: Diagnosis present

## 2018-11-14 DIAGNOSIS — I130 Hypertensive heart and chronic kidney disease with heart failure and stage 1 through stage 4 chronic kidney disease, or unspecified chronic kidney disease: Secondary | ICD-10-CM | POA: Diagnosis not present

## 2018-11-14 DIAGNOSIS — G894 Chronic pain syndrome: Secondary | ICD-10-CM | POA: Insufficient documentation

## 2018-11-14 DIAGNOSIS — F17210 Nicotine dependence, cigarettes, uncomplicated: Secondary | ICD-10-CM | POA: Insufficient documentation

## 2018-11-14 DIAGNOSIS — M1990 Unspecified osteoarthritis, unspecified site: Secondary | ICD-10-CM | POA: Diagnosis not present

## 2018-11-14 DIAGNOSIS — Z809 Family history of malignant neoplasm, unspecified: Secondary | ICD-10-CM | POA: Diagnosis not present

## 2018-11-14 DIAGNOSIS — E039 Hypothyroidism, unspecified: Secondary | ICD-10-CM | POA: Insufficient documentation

## 2018-11-14 DIAGNOSIS — Z8249 Family history of ischemic heart disease and other diseases of the circulatory system: Secondary | ICD-10-CM | POA: Diagnosis not present

## 2018-11-14 DIAGNOSIS — Z794 Long term (current) use of insulin: Secondary | ICD-10-CM | POA: Insufficient documentation

## 2018-11-14 DIAGNOSIS — Z79890 Hormone replacement therapy (postmenopausal): Secondary | ICD-10-CM | POA: Insufficient documentation

## 2018-11-14 DIAGNOSIS — Z79899 Other long term (current) drug therapy: Secondary | ICD-10-CM | POA: Insufficient documentation

## 2018-11-14 DIAGNOSIS — Z833 Family history of diabetes mellitus: Secondary | ICD-10-CM | POA: Diagnosis not present

## 2018-11-14 DIAGNOSIS — Z6841 Body Mass Index (BMI) 40.0 and over, adult: Secondary | ICD-10-CM | POA: Diagnosis not present

## 2018-11-14 DIAGNOSIS — Z720 Tobacco use: Secondary | ICD-10-CM

## 2018-11-14 DIAGNOSIS — I5023 Acute on chronic systolic (congestive) heart failure: Secondary | ICD-10-CM | POA: Diagnosis not present

## 2018-11-14 DIAGNOSIS — Z8673 Personal history of transient ischemic attack (TIA), and cerebral infarction without residual deficits: Secondary | ICD-10-CM | POA: Insufficient documentation

## 2018-11-14 DIAGNOSIS — Z7902 Long term (current) use of antithrombotics/antiplatelets: Secondary | ICD-10-CM | POA: Insufficient documentation

## 2018-11-14 DIAGNOSIS — I10 Essential (primary) hypertension: Secondary | ICD-10-CM | POA: Diagnosis not present

## 2018-11-14 DIAGNOSIS — J45909 Unspecified asthma, uncomplicated: Secondary | ICD-10-CM | POA: Diagnosis not present

## 2018-11-14 DIAGNOSIS — I447 Left bundle-branch block, unspecified: Secondary | ICD-10-CM | POA: Diagnosis not present

## 2018-11-14 LAB — BASIC METABOLIC PANEL
Anion gap: 11 (ref 5–15)
BUN: 81 mg/dL — ABNORMAL HIGH (ref 8–23)
CO2: 26 mmol/L (ref 22–32)
Calcium: 10.3 mg/dL (ref 8.9–10.3)
Chloride: 103 mmol/L (ref 98–111)
Creatinine, Ser: 2.02 mg/dL — ABNORMAL HIGH (ref 0.44–1.00)
GFR calc Af Amer: 29 mL/min — ABNORMAL LOW (ref >60)
GFR calc non Af Amer: 25 mL/min — ABNORMAL LOW (ref >60)
Glucose, Bld: 158 mg/dL — ABNORMAL HIGH (ref 70–99)
Potassium: 4.2 mmol/L (ref 3.5–5.1)
Sodium: 140 mmol/L (ref 135–145)

## 2018-11-14 LAB — BRAIN NATRIURETIC PEPTIDE: B Natriuretic Peptide: 206.8 pg/mL — ABNORMAL HIGH (ref 0.0–100.0)

## 2018-11-14 MED ORDER — CARVEDILOL 6.25 MG PO TABS: 6.2500 mg | ORAL_TABLET | Freq: Two times a day (BID) | ORAL | 3 refills | Status: DC

## 2018-11-14 NOTE — Patient Instructions (Signed)
Labs were done today. We will call you with any ABNORMAL results. No news is good news!  INCREASE Carvedilol to 6.25mg  TWICE A DAY, this prescription has been sent to your pharmacy.   Your physician recommends that you schedule a follow-up appointment in: 4 WEEKS.

## 2018-11-24 ENCOUNTER — Other Ambulatory Visit: Payer: Self-pay | Admitting: Internal Medicine

## 2018-11-24 DIAGNOSIS — Z1231 Encounter for screening mammogram for malignant neoplasm of breast: Secondary | ICD-10-CM

## 2018-11-27 ENCOUNTER — Encounter (HOSPITAL_COMMUNITY): Payer: Self-pay | Admitting: Emergency Medicine

## 2018-11-27 ENCOUNTER — Observation Stay (HOSPITAL_COMMUNITY): Payer: Medicare Other

## 2018-11-27 ENCOUNTER — Observation Stay (HOSPITAL_COMMUNITY)
Admission: EM | Admit: 2018-11-27 | Discharge: 2018-11-28 | Disposition: A | Discharge status: Home / Self Care | Payer: Medicare Other | Attending: Family Medicine | Admitting: Family Medicine

## 2018-11-27 ENCOUNTER — Other Ambulatory Visit: Payer: Self-pay

## 2018-11-27 ENCOUNTER — Emergency Department (HOSPITAL_COMMUNITY): Payer: Medicare Other

## 2018-11-27 DIAGNOSIS — E1151 Type 2 diabetes mellitus with diabetic peripheral angiopathy without gangrene: Secondary | ICD-10-CM | POA: Insufficient documentation

## 2018-11-27 DIAGNOSIS — Z794 Long term (current) use of insulin: Secondary | ICD-10-CM | POA: Insufficient documentation

## 2018-11-27 DIAGNOSIS — Z885 Allergy status to narcotic agent status: Secondary | ICD-10-CM | POA: Insufficient documentation

## 2018-11-27 DIAGNOSIS — Z86718 Personal history of other venous thrombosis and embolism: Secondary | ICD-10-CM | POA: Insufficient documentation

## 2018-11-27 DIAGNOSIS — E785 Hyperlipidemia, unspecified: Secondary | ICD-10-CM | POA: Insufficient documentation

## 2018-11-27 DIAGNOSIS — I130 Hypertensive heart and chronic kidney disease with heart failure and stage 1 through stage 4 chronic kidney disease, or unspecified chronic kidney disease: Secondary | ICD-10-CM | POA: Insufficient documentation

## 2018-11-27 DIAGNOSIS — Z79890 Hormone replacement therapy (postmenopausal): Secondary | ICD-10-CM | POA: Insufficient documentation

## 2018-11-27 DIAGNOSIS — Z7951 Long term (current) use of inhaled steroids: Secondary | ICD-10-CM | POA: Diagnosis not present

## 2018-11-27 DIAGNOSIS — Z8249 Family history of ischemic heart disease and other diseases of the circulatory system: Secondary | ICD-10-CM | POA: Insufficient documentation

## 2018-11-27 DIAGNOSIS — R55 Syncope and collapse: Secondary | ICD-10-CM

## 2018-11-27 DIAGNOSIS — Z7902 Long term (current) use of antithrombotics/antiplatelets: Secondary | ICD-10-CM | POA: Diagnosis not present

## 2018-11-27 DIAGNOSIS — R1013 Epigastric pain: Secondary | ICD-10-CM | POA: Diagnosis present

## 2018-11-27 DIAGNOSIS — R079 Chest pain, unspecified: Secondary | ICD-10-CM | POA: Diagnosis present

## 2018-11-27 DIAGNOSIS — D631 Anemia in chronic kidney disease: Secondary | ICD-10-CM | POA: Insufficient documentation

## 2018-11-27 DIAGNOSIS — I447 Left bundle-branch block, unspecified: Secondary | ICD-10-CM | POA: Diagnosis not present

## 2018-11-27 DIAGNOSIS — N183 Chronic kidney disease, stage 3 (moderate): Secondary | ICD-10-CM | POA: Diagnosis not present

## 2018-11-27 DIAGNOSIS — Z8673 Personal history of transient ischemic attack (TIA), and cerebral infarction without residual deficits: Secondary | ICD-10-CM | POA: Insufficient documentation

## 2018-11-27 DIAGNOSIS — G894 Chronic pain syndrome: Secondary | ICD-10-CM | POA: Insufficient documentation

## 2018-11-27 DIAGNOSIS — M109 Gout, unspecified: Secondary | ICD-10-CM | POA: Diagnosis not present

## 2018-11-27 DIAGNOSIS — G4733 Obstructive sleep apnea (adult) (pediatric): Secondary | ICD-10-CM | POA: Diagnosis not present

## 2018-11-27 DIAGNOSIS — E1122 Type 2 diabetes mellitus with diabetic chronic kidney disease: Secondary | ICD-10-CM | POA: Insufficient documentation

## 2018-11-27 DIAGNOSIS — E039 Hypothyroidism, unspecified: Secondary | ICD-10-CM | POA: Insufficient documentation

## 2018-11-27 DIAGNOSIS — Z79899 Other long term (current) drug therapy: Secondary | ICD-10-CM | POA: Diagnosis not present

## 2018-11-27 DIAGNOSIS — I509 Heart failure, unspecified: Secondary | ICD-10-CM | POA: Insufficient documentation

## 2018-11-27 DIAGNOSIS — Z888 Allergy status to other drugs, medicaments and biological substances status: Secondary | ICD-10-CM | POA: Insufficient documentation

## 2018-11-27 DIAGNOSIS — F17210 Nicotine dependence, cigarettes, uncomplicated: Secondary | ICD-10-CM | POA: Diagnosis not present

## 2018-11-27 DIAGNOSIS — Z79891 Long term (current) use of opiate analgesic: Secondary | ICD-10-CM | POA: Diagnosis not present

## 2018-11-27 DIAGNOSIS — R0789 Other chest pain: Secondary | ICD-10-CM | POA: Diagnosis not present

## 2018-11-27 DIAGNOSIS — R9431 Abnormal electrocardiogram [ECG] [EKG]: Secondary | ICD-10-CM | POA: Insufficient documentation

## 2018-11-27 DIAGNOSIS — Z833 Family history of diabetes mellitus: Secondary | ICD-10-CM | POA: Insufficient documentation

## 2018-11-27 DIAGNOSIS — K219 Gastro-esophageal reflux disease without esophagitis: Secondary | ICD-10-CM | POA: Diagnosis not present

## 2018-11-27 DIAGNOSIS — E119 Type 2 diabetes mellitus without complications: Secondary | ICD-10-CM

## 2018-11-27 DIAGNOSIS — N184 Chronic kidney disease, stage 4 (severe): Secondary | ICD-10-CM | POA: Diagnosis present

## 2018-11-27 LAB — HEPATIC FUNCTION PANEL
ALT: 15 U/L (ref 0–44)
AST: 22 U/L (ref 15–41)
Albumin: 3.1 g/dL — ABNORMAL LOW (ref 3.5–5.0)
Alkaline Phosphatase: 35 U/L — ABNORMAL LOW (ref 38–126)
Bilirubin, Direct: 0.1 mg/dL (ref 0.0–0.2)
Indirect Bilirubin: 0.5 mg/dL (ref 0.3–0.9)
Total Bilirubin: 0.6 mg/dL (ref 0.3–1.2)
Total Protein: 6.1 g/dL — ABNORMAL LOW (ref 6.5–8.1)

## 2018-11-27 LAB — CBC
HCT: 36.7 % (ref 36.0–46.0)
Hemoglobin: 11.1 g/dL — ABNORMAL LOW (ref 12.0–15.0)
MCH: 26.2 pg (ref 26.0–34.0)
MCHC: 30.2 g/dL (ref 30.0–36.0)
MCV: 86.8 fL (ref 80.0–100.0)
Platelets: 294 10*3/uL (ref 150–400)
RBC: 4.23 MIL/uL (ref 3.87–5.11)
RDW: 17.5 % — ABNORMAL HIGH (ref 11.5–15.5)
WBC: 10 10*3/uL (ref 4.0–10.5)
nRBC: 0 % (ref 0.0–0.2)

## 2018-11-27 LAB — BASIC METABOLIC PANEL
Anion gap: 11 (ref 5–15)
BUN: 70 mg/dL — ABNORMAL HIGH (ref 8–23)
CO2: 30 mmol/L (ref 22–32)
Calcium: 9.9 mg/dL (ref 8.9–10.3)
Chloride: 99 mmol/L (ref 98–111)
Creatinine, Ser: 2.2 mg/dL — ABNORMAL HIGH (ref 0.44–1.00)
GFR calc Af Amer: 26 mL/min — ABNORMAL LOW (ref >60)
GFR calc non Af Amer: 22 mL/min — ABNORMAL LOW (ref >60)
Glucose, Bld: 110 mg/dL — ABNORMAL HIGH (ref 70–99)
Potassium: 4.9 mmol/L (ref 3.5–5.1)
Sodium: 140 mmol/L (ref 135–145)

## 2018-11-27 LAB — URINALYSIS, ROUTINE W REFLEX MICROSCOPIC
Bilirubin Urine: NEGATIVE
Glucose, UA: NEGATIVE mg/dL
Hgb urine dipstick: NEGATIVE
Ketones, ur: NEGATIVE mg/dL
Leukocytes,Ua: NEGATIVE
Nitrite: NEGATIVE
Protein, ur: NEGATIVE mg/dL
Specific Gravity, Urine: 1.008 (ref 1.005–1.030)
pH: 5 (ref 5.0–8.0)

## 2018-11-27 LAB — GLUCOSE, CAPILLARY: Glucose-Capillary: 165 mg/dL — ABNORMAL HIGH (ref 70–99)

## 2018-11-27 LAB — TROPONIN I: Troponin I: 0.07 ng/mL (ref <0.03)

## 2018-11-27 LAB — LIPASE, BLOOD: Lipase: 43 U/L (ref 11–51)

## 2018-11-27 LAB — I-STAT TROPONIN, ED
Troponin i, poc: 0.08 ng/mL (ref 0.00–0.08)
Troponin i, poc: 0.06 ng/mL (ref 0.00–0.08)

## 2018-11-27 LAB — D-DIMER, QUANTITATIVE: D-Dimer, Quant: 1.09 ug/mL-FEU — ABNORMAL HIGH (ref 0.00–0.50)

## 2018-11-27 MED ORDER — ASPIRIN 81 MG PO CHEW
324.0000 mg | CHEWABLE_TABLET | Freq: Once | ORAL | Status: AC
  Administered 2018-11-27: 324 mg via ORAL
  Filled 2018-11-27: qty 4

## 2018-11-27 MED ORDER — INSULIN ASPART 100 UNIT/ML ~~LOC~~ SOLN
0.0000 [IU] | Freq: Three times a day (TID) | SUBCUTANEOUS | Status: DC
  Administered 2018-11-28: 2 [IU] via SUBCUTANEOUS
  Administered 2018-11-28: 1 [IU] via SUBCUTANEOUS

## 2018-11-27 MED ORDER — ENOXAPARIN SODIUM 40 MG/0.4ML ~~LOC~~ SOLN
40.0000 mg | SUBCUTANEOUS | Status: DC
  Administered 2018-11-27: 40 mg via SUBCUTANEOUS
  Filled 2018-11-27: qty 0.4

## 2018-11-27 MED ORDER — FENTANYL CITRATE (PF) 100 MCG/2ML IJ SOLN
25.0000 ug | Freq: Once | INTRAMUSCULAR | Status: AC
  Administered 2018-11-27: 25 ug via INTRAVENOUS
  Filled 2018-11-27: qty 2

## 2018-11-27 MED ORDER — ACETAMINOPHEN 325 MG PO TABS
650.0000 mg | ORAL_TABLET | Freq: Four times a day (QID) | ORAL | Status: DC | PRN
  Administered 2018-11-27: 650 mg via ORAL
  Filled 2018-11-27: qty 2

## 2018-11-27 MED ORDER — ACETAMINOPHEN 650 MG RE SUPP: 650.0000 mg | Freq: Four times a day (QID) | RECTAL | Status: DC | PRN

## 2018-11-27 MED ORDER — OXYCODONE HCL 5 MG PO TABS
10.0000 mg | ORAL_TABLET | Freq: Once | ORAL | Status: AC
  Administered 2018-11-27: 10 mg via ORAL
  Filled 2018-11-27: qty 2

## 2018-11-27 NOTE — ED Triage Notes (Signed)
[facility]: pt from home with complaints of weakness. Home health nurse states she was eating lunch and fell backwards and her eyes rolled back in her head. ;ethargic for 2 days.    BP 126/68 HR 70 CBG 137 99% RA RR 18

## 2018-11-27 NOTE — ED Notes (Signed)
Pt and "friends" being disrespectful, rude, demanding and calling RNs inappropriate names while laughing about it. I informed pt that we are respectful to her and that we expect to be treated the same way.

## 2018-11-27 NOTE — ED Notes (Signed)
Pt aware we need a urine specimen.

## 2018-11-27 NOTE — ED Provider Notes (Signed)
[facility] Emergency Department Provider Note MRN:  [id number]  Arrival date & time: 11/27/18     Chief Complaint   Chest Pain  History of Present Illness   [name] [name] is a 68 y.o. year-old female with a history of diabetes, PAD presenting to the ED with chief complaint of chest pain.  Patient has been feeling general malaise and decreased energy, generalized weakness for the past 2 days.  Had a near syncopal event today while laying in bed, causing her to fall backwards in bed onto the mattress.  Since 8 or 9 AM this morning, patient is experiencing central chest pain, described as a pressure, radiates to her back.  Denies recent fever or cough, no numbness weakness to the arms or legs, no headache or vision change, no abdominal pain, no dysuria.  Review of Systems  A complete 10 system review of systems was obtained and all systems are negative except as noted in the HPI and PMH.   Patient's Health History    Past Medical History:  Diagnosis Date  . Anemia   . Arthritis   . Asthma   . Bronchitis   . Chronic kidney disease   . Chronic kidney disease   . Chronic pain syndrome   . Cramping of feet   . Diabetes mellitus   . GERD (gastroesophageal reflux disease)   . Gout   . Headache   . History of hip replacement, total   . History of transfusion   . Hx MRSA infection    abscess left groin  . Hx of cardiac cath 06/2014  . Hyperlipidemia   . Hypertension   . Hypothyroid   . 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 Morbidly obese ([place])   . Peripheral vascular disease ([place])   . Positive cardiac stress test 12/2013   anterior and lateral ischemia on Myoview  . Sleep apnea    UNABLE TO TOLERATE C PAP  . Stress incontinence   . Thyroid disease   . TIA (transient ischemic attack)     X2 NO RESIDUAL PROBLEMS    Past Surgical History:  Procedure Laterality Date  . ABDOMINAL HYSTERECTOMY    . COLON SURGERY  1995   DUE TO POLYP  . FOREARM FRACTURE  SURGERY     Left arm  . HERNIA REPAIR     w/ mesh  . INCISION AND DRAINAGE ABSCESS Left 02/04/2013   Procedure: INCISION AND DRAINAGE LEFT BUTTOCK ABSCESS; INCISION AND DRAINAGE LEFT BREAST ABSCESS;  Surgeon: Harl Bowie, MD;  Location: [place];  Service: General;  Laterality: Left;  . INCISION AND DRAINAGE ABSCESS N/A 02/12/2013   Procedure: INCISION AND DEBRIDEMENT BUTTOCK WOUND ;  Surgeon: [name]. Georgette Dover, MD;  Location: [place];  Service: General;  Laterality: N/A;  . INCISION AND DRAINAGE ABSCESS N/A 02/14/2013   Procedure: INCISION AND DRAINAGE/DRESSING CHANGE;  Surgeon: Harl Bowie, MD;  Location: [place];  Service: General;  Laterality: N/A;  . INCISION AND DRAINAGE ABSCESS N/A 03/21/2015   Procedure: INCISION AND DRAINAGE PUBIC ABSCESS;  Surgeon: Excell Seltzer, MD;  Location: [facility];  Service: General;  Laterality: N/A;  . INCISION AND DRAINAGE PERIRECTAL ABSCESS Left 02/10/2013   Procedure: IRRIGATION AND DEBRIDEMENT OF BUTTOCK/PERINEAL ABSCESS;  Surgeon: [name]. Georgette Dover, MD;  Location: [place];  Service: General;  Laterality: Left;  . INCISION AND DRAINAGE PERIRECTAL ABSCESS N/A 02/16/2013   Procedure: IRRIGATION AND DEBRIDEMENT PERINEAL ABSCESS;  Surgeon: Zenovia Jarred, MD;  Location: [place] [facility];  Service: General;  Laterality: N/A;  . IRRIGATION AND DEBRIDEMENT ABSCESS N/A 02/06/2013   Procedure: IRRIGATION AND DEBRIDEMENT BUTTOCK ABSCESS AND DRESSING CHANGE;  Surgeon: Harl Bowie, MD;  Location: [place];  Service: General;  Laterality: N/A;  . IRRIGATION AND DEBRIDEMENT ABSCESS Left 02/08/2013   Procedure: IRRIGATION AND DEBRIDEMENT ABSCESS/DRESSING CHANGE;  Surgeon: Gwenyth Ober, MD;  Location: [place];  Service: General;  Laterality: Left;  . JOINT REPLACEMENT  2010 / 2012   . LAPAROSCOPIC CHOLECYSTECTOMY    . LEFT HEART CATHETERIZATION WITH CORONARY ANGIOGRAM N/A 07/02/2014   Procedure: LEFT HEART CATHETERIZATION WITH CORONARY ANGIOGRAM;  Surgeon: Lorretta Harp, MD;   Location: [facility] CATH LAB;  Service: Cardiovascular;  Laterality: N/A;  . LOWER EXTREMITY ANGIOGRAM Right 04/20/2016   Procedure: Lower Extremity Angiogram;  Surgeon: Elam Dutch, MD;  Location: [place] [facility];  Service: Cardiovascular;  Laterality: Right;  . PERIPHERAL VASCULAR CATHETERIZATION N/A 04/20/2016   Procedure: Abdominal Aortogram;  Surgeon: Elam Dutch, MD;  Location: [place] [facility];  Service: Cardiovascular;  Laterality: N/A;  . PERIPHERAL VASCULAR CATHETERIZATION Right 04/20/2016   Procedure: Peripheral Vascular Intervention;  Surgeon: Elam Dutch, MD;  Location: [place] [facility];  Service: Cardiovascular;  Laterality: Right;  popiteal  . RIGHT HEART CATH N/A 10/27/2018   Procedure: RIGHT HEART CATH;  Surgeon: Larey Dresser, MD;  Location: [place] [facility];  Service: Cardiovascular;  Laterality: N/A;  . THYROIDECTOMY    . TOTAL HIP REVISION Right 08/11/2014   Procedure: RIGHT ACETABULAR REVISION;  Surgeon: Gearlean Alf, MD;  Location[facility];  Service: Orthopedics;  Laterality: Right;    Family History  Problem Relation Age of Onset  . Diabetes Brother   . Cardiomyopathy Mother   . Heart disease Mother   . Cancer Father     Social History   Socioeconomic History  . Marital status: Legally Separated    Spouse name: Not on file  . Number of children: 1  . Years of education: college  . Highest education level: Not on file  Occupational History    Comment: Disabled  Social Needs  . Financial resource strain: Not on file  . Food insecurity:    Worry: Not on file    Inability: Not on file  . Transportation needs:    Medical: Not on file    Non-medical: Not on file  Tobacco Use  . Smoking status: Current Some Day Smoker    Packs/day: 0.25    Years: 40.00    Pack years: 10.00    Types: Cigarettes  . Smokeless tobacco: Never Used  Substance and Sexual Activity  . Alcohol use: Yes    Alcohol/week: 1.0 standard drinks    Types: 1 Standard  drinks or equivalent per week    Comment: OCC  . Drug use: No    Comment: Quit four years ago.  Marland Kitchen Sexual activity: Not on file  Lifestyle  . Physical activity:    Days per week: Not on file    Minutes per session: Not on file  . Stress: Not on file  Relationships  . Social connections:    Talks on phone: Not on file    Gets together: Not on file    Attends religious service: Not on file    Active member of club or organization: Not on file    Attends meetings of clubs or organizations: Not on file    Relationship status:  Not on file  . Intimate partner violence:    Fear of current or ex partner: Not on file    Emotionally abused: Not on file    Physically abused: Not on file    Forced sexual activity: Not on file  Other Topics Concern  . Not on file  Social History Narrative   Patient is separated  and lives at home with her friend. Patient is disabled.   Education one year of college   Right handed   Caffeine two cups daily.        Physical Exam  Vital Signs and Nursing Notes reviewed Vitals:   11/27/18 1345 11/27/18 1500  BP: (!) 147/74 137/66  Pulse: 77 75  Resp: 18 17  SpO2: 97% 98%    CONSTITUTIONAL: Well-appearing, NAD NEURO:  Alert and oriented x 3, no focal deficits EYES:  eyes equal and reactive ENT/NECK:  no LAD, no JVD CARDIO: Regular rate, well-perfused, normal S1 and S2 PULM:  CTAB no wheezing or rhonchi GI/GU:  normal bowel sounds, non-distended, non-tender MSK/SPINE:  No gross deformities, no edema SKIN:  no rash, atraumatic PSYCH:  Appropriate speech and behavior  Diagnostic and Interventional Summary    EKG Interpretation  Date/Time:  Thursday November 27 2018 12:09:39 EST Ventricular Rate:  76 PR Interval:    QRS Duration: 136 QT Interval:  421 QTC Calculation: 474 R Axis:   -48 Text Interpretation:  Sinus rhythm Nonspecific IVCD with LAD Left ventricular hypertrophy Borderline T abnormalities, diffuse leads Baseline wander in lead(s)  II III aVL aVF Confirmed by Gerlene Fee [phone]) on 11/27/2018 12:46:49 PM      Labs Reviewed  CBC - Abnormal; Notable for the following components:      Result Value   Hemoglobin 11.1 (*)    RDW 17.5 (*)    All other components within normal limits  URINALYSIS, ROUTINE W REFLEX MICROSCOPIC  BASIC METABOLIC PANEL  D-DIMER, QUANTITATIVE (NOT AT [facility])  LIPASE, BLOOD  I-STAT TROPONIN, ED  I-STAT TROPONIN, ED    DG Chest 2 View  Final Result      Medications  aspirin chewable tablet 324 mg (324 mg Oral Given 11/27/18 1347)  fentaNYL (SUBLIMAZE) injection 25 mcg (25 mcg Intravenous Given 11/27/18 1349)     Procedures Critical Care  ED Course and Medical Decision Making  I have reviewed the triage vital signs and the nursing notes.  Pertinent labs & imaging results that were available during my care of the patient were reviewed by me and considered in my medical decision making (see below for details).  Considering unstable angina in this 68 year old female with multiple risk factors, recent admission for heart failure exacerbation also raises the concern for pulmonary embolism and/or DVT.  Patient's right leg is tender to palpation, but patient states this is normal for her.  Screening with d-dimer, troponin pending.  There was a lab delay, but labs are currently pending.  First troponin is negative, second troponin pending, d-dimer still pending.  Would CTA chest if d-dimer positive.  If negative, can be admitted to hospital service for chest pain rule out and further management or observation of high risk syncope/presyncope.  Signed out to Dr. Billy Fischer at shift change.  Barth Kirks. Sedonia Small, [place] [email or web address]  Final Clinical Impressions(s) / ED Diagnoses     ICD-10-CM   1. Chest pain R07.9 DG Chest 2 View    DG Chest 2 View    ED  Discharge Orders    None         Maudie Flakes, MD 11/27/18 [phone]

## 2018-11-27 NOTE — ED Notes (Signed)
Pt family asked Network engineer for update on pt  This RN attempted to update family on pt status, family currently away from the bedside.

## 2018-11-27 NOTE — ED Notes (Signed)
Pt given [place] sandwich, crackers, and gingerale.

## 2018-11-27 NOTE — H&P (Signed)
History and Physical    CHARLYN VIALPANDO ONG:[id number] DOB: [birth year] DOA: 11/27/2018  PCP: [name], MD Patient coming from: Home  Chief Complaint: Chest pain, generalized weakness  HPI: OLANDA BOUGHNER is a 68 y.o. female with medical history significant of type 2 diabetes, CKD stage III, hypertension, hyperlipidemia, hypothyroidism, OSA presenting to the hospital for evaluation of chest pain and generalized weakness.  Patient states she sat down to eat breakfast this morning and before she could eat she felt lightheaded and weak all over.  Her aide checked her sugar at that time and it was 137.  Patient does not think she lost consciousness as she could hear her aide call her name the entire time.  States she started vomiting and experience substernal/epigastric pressure.  Symptoms lasted until she arrived to the ED around lunchtime.  Reports having history of clot in her left leg several years ago.  States her right leg is chronically swollen and painful.  Denies having any recent fevers, abdominal pain, diarrhea, or constipation.  Patient was upset about being placed in the emergency room hallway and no additional history could be obtained from her.  Patient was admitted to the hospital from 1/13 to 1/24 for CHF exacerbation.  Review of Systems: As per HPI otherwise 10 point review of systems negative.  Past Medical History:  Diagnosis Date  . Anemia   . Arthritis   . Asthma   . Bronchitis   . Chronic kidney disease   . Chronic kidney disease   . Chronic pain syndrome   . Cramping of feet   . Diabetes mellitus   . GERD (gastroesophageal reflux disease)   . Gout   . Headache   . History of hip replacement, total   . History of transfusion   . Hx MRSA infection    abscess left groin  . Hx of cardiac cath 06/2014  . Hyperlipidemia   . Hypertension   . Hypothyroid   . 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 Morbidly obese ([place])   . Peripheral vascular disease ([place])     . Positive cardiac stress test 12/2013   anterior and lateral ischemia on Myoview  . Sleep apnea    UNABLE TO TOLERATE C PAP  . Stress incontinence   . Thyroid disease   . TIA (transient ischemic attack)     X2 NO RESIDUAL PROBLEMS    Past Surgical History:  Procedure Laterality Date  . ABDOMINAL HYSTERECTOMY    . COLON SURGERY  1995   DUE TO POLYP  . FOREARM FRACTURE SURGERY     Left arm  . HERNIA REPAIR     w/ mesh  . INCISION AND DRAINAGE ABSCESS Left 02/04/2013   Procedure: INCISION AND DRAINAGE LEFT BUTTOCK ABSCESS; INCISION AND DRAINAGE LEFT BREAST ABSCESS;  Surgeon: Harl Bowie, MD;  Location: [place];  Service: General;  Laterality: Left;  . INCISION AND DRAINAGE ABSCESS N/A 02/12/2013   Procedure: INCISION AND DEBRIDEMENT BUTTOCK WOUND ;  Surgeon: [name]. Georgette Dover, MD;  Location: [place];  Service: General;  Laterality: N/A;  . INCISION AND DRAINAGE ABSCESS N/A 02/14/2013   Procedure: INCISION AND DRAINAGE/DRESSING CHANGE;  Surgeon: Harl Bowie, MD;  Location: [place];  Service: General;  Laterality: N/A;  . INCISION AND DRAINAGE ABSCESS N/A 03/21/2015   Procedure: INCISION AND DRAINAGE PUBIC ABSCESS;  Surgeon: Excell Seltzer, MD;  Location: [facility];  Service: General;  Laterality: N/A;  . INCISION AND DRAINAGE PERIRECTAL ABSCESS  Left 02/10/2013   Procedure: IRRIGATION AND DEBRIDEMENT OF BUTTOCK/PERINEAL ABSCESS;  Surgeon: [name]. Georgette Dover, MD;  Location: [place];  Service: General;  Laterality: Left;  . INCISION AND DRAINAGE PERIRECTAL ABSCESS N/A 02/16/2013   Procedure: IRRIGATION AND DEBRIDEMENT PERINEAL ABSCESS;  Surgeon: Zenovia Jarred, MD;  Location: [place];  Service: General;  Laterality: N/A;  . IRRIGATION AND DEBRIDEMENT ABSCESS N/A 02/06/2013   Procedure: IRRIGATION AND DEBRIDEMENT BUTTOCK ABSCESS AND DRESSING CHANGE;  Surgeon: Harl Bowie, MD;  Location: [place];  Service: General;  Laterality: N/A;  . IRRIGATION AND DEBRIDEMENT ABSCESS Left 02/08/2013    Procedure: IRRIGATION AND DEBRIDEMENT ABSCESS/DRESSING CHANGE;  Surgeon: Gwenyth Ober, MD;  Location: [place];  Service: General;  Laterality: Left;  . JOINT REPLACEMENT  2010 / 2012   . LAPAROSCOPIC CHOLECYSTECTOMY    . LEFT HEART CATHETERIZATION WITH CORONARY ANGIOGRAM N/A 07/02/2014   Procedure: LEFT HEART CATHETERIZATION WITH CORONARY ANGIOGRAM;  Surgeon: Lorretta Harp, MD;  Location: [facility];  Service: Cardiovascular;  Laterality: N/A;  . LOWER EXTREMITY ANGIOGRAM Right 04/20/2016   Procedure: Lower Extremity Angiogram;  Surgeon: Elam Dutch, MD;  Location: [place] CV [facility];  Service: Cardiovascular;  Laterality: Right;  . PERIPHERAL VASCULAR CATHETERIZATION N/A 04/20/2016   Procedure: Abdominal Aortogram;  Surgeon: Elam Dutch, MD;  Location: [place] [facility];  Service: Cardiovascular;  Laterality: N/A;  . PERIPHERAL VASCULAR CATHETERIZATION Right 04/20/2016   Procedure: Peripheral Vascular Intervention;  Surgeon: Elam Dutch, MD;  Location: [place] [facility];  Service: Cardiovascular;  Laterality: Right;  popiteal  . RIGHT HEART CATH N/A 10/27/2018   Procedure: RIGHT HEART CATH;  Surgeon: Larey Dresser, MD;  Location: [place] [facility];  Service: Cardiovascular;  Laterality: N/A;  . THYROIDECTOMY    . TOTAL HIP REVISION Right 08/11/2014   Procedure: RIGHT ACETABULAR REVISION;  Surgeon: Gearlean Alf, MD;  Location: [facility];  Service: Orthopedics;  Laterality: Right;     reports that she has been smoking cigarettes. She has a 10.00 pack-year smoking history. She has never used smokeless tobacco. She reports current alcohol use of about 1.0 standard drinks of alcohol per week. She reports that she does not use drugs.  Allergies  Allergen Reactions  . Nebivolol Swelling  . Ace Inhibitors Swelling and Other (See Comments)    Tongue swell  . Bystolic [Nebivolol Hcl] Swelling and Other (See Comments)    Chest pain   . Morphine And Related Itching    Family  History  Problem Relation Age of Onset  . Diabetes Brother   . Cardiomyopathy Mother   . Heart disease Mother   . Cancer Father     Prior to Admission medications   Medication Sig Start Date End Date Taking? Authorizing Provider  albuterol (PROVENTIL) (2.5 MG/3ML) 0.083% nebulizer solution Take 2.5 mg by nebulization every 6 (six) hours as needed for wheezing or shortness of breath.    [provider]  allopurinol (ZYLOPRIM) 100 MG tablet Take 100 mg by mouth every morning.     [provider]  B Complex Vitamins (VITAMIN B COMPLEX PO) Take 1 tablet by mouth 2 (two) times daily.     [provider]  budesonide-formoterol (SYMBICORT) 160-4.5 MCG/ACT inhaler Inhale 2 puffs into the lungs 2 (two) times daily as needed (for SOB).     [provider]  Calcium Carbonate-Vit D-Min (CALCIUM 1200 PO) Take 1,200 mg by mouth daily.     [provider]  carvedilol (COREG) 6.25 MG tablet TAKE 1 TABLET BY MOUTH TWICE DAILY, WITH A MEAL 11/17/18   Bensimhon, Shaune Pascal, MD  cetirizine (ZYRTEC) 10 MG tablet Take 10 mg by mouth daily as needed for allergies.  03/14/15   [provider]  clopidogrel (PLAVIX) 75 MG tablet TAKE 1 TABLET BY MOUTH EVERY DAY Patient taking differently: Take 75 mg by mouth daily.  06/18/17   Waynetta Sandy, MD  Dulaglutide 1.5 MG/0.5ML SOPN Inject 1.5 mg into the skin every Monday.     [provider]  fenofibrate (TRICOR) 145 MG tablet TAKE 1 TABLET (145 MG TOTAL) BY MOUTH DAILY. Patient taking differently: Take 145 mg by mouth daily.  05/11/15   Lorretta Harp, MD  fluticasone (FLONASE) 50 MCG/ACT nasal spray Place 2 sprays into the nose as needed for allergies.     [provider]  hydrOXYzine (ATARAX/VISTARIL) 25 MG tablet Take 25 mg by mouth 2 (two) times daily as needed for itching.    [provider]  insulin lispro protamine-lispro (HUMALOG 75/25 MIX) (75-25) 100 UNIT/ML SUSP injection  Inject 40 Units into the skin 2 (two) times daily with a meal.     [provider]  levothyroxine (SYNTHROID, LEVOTHROID) 150 MCG tablet Take 150 mcg by mouth daily before breakfast.    [provider]  mirabegron ER (MYRBETRIQ) 50 MG TB24 tablet Take 50 mg by mouth daily.    [provider]  Multiple Vitamin (MULTIVITAMIN WITH MINERALS) TABS tablet Take 1 tablet by mouth daily.    [provider]  naftifine (NAFTIN) 1 % cream APPLY TO THE AFFECTED AREA ON SKIN EVERY DAY Patient not taking: No sig reported 05/23/15   Hyatt, Max T, DPM  oxyCODONE-acetaminophen (PERCOCET) 10-325 MG tablet Take 1 tablet by mouth every 4 (four) hours as needed for pain.    [provider]  polyvinyl alcohol (LIQUIFILM TEARS) 1.4 % ophthalmic solution Place 2 drops into both eyes daily as needed for dry eyes.    [provider]  simvastatin (ZOCOR) 20 MG tablet Take 20 mg by mouth at bedtime. 04/08/17   [provider]  tiZANidine (ZANAFLEX) 4 MG capsule Take 4 mg by mouth 2 (two) times daily.     [provider]  torsemide (DEMADEX) 20 MG tablet Take 4 tablets (80 mg total) by mouth every morning AND 2 tablets (40 mg total) every evening. 10/31/18   Mariel Aloe, MD    Physical Exam: Vitals:   11/27/18 1330 11/27/18 1345 11/27/18 1500 11/27/18 1800  BP: (!) 146/67 (!) 147/74 137/66   Pulse: 80 77 75   Resp: 19 18 17    Temp:    98.1 F (36.7 C)  TempSrc:    Oral  SpO2: 97% 97% 98%   Weight:      Height:        Physical Exam  Constitutional: She is oriented to person, place, and time. She appears well-developed and well-nourished. No distress.  Sitting up in a hospital stretcher eating a sandwich  HENT:  Head: Normocephalic.  Mouth/Throat: Oropharynx is clear and moist.  Eyes: EOM are normal. Right eye exhibits no discharge. Left eye exhibits no discharge.  Neck: Neck supple.  Cardiovascular: Normal rate, regular rhythm and intact  distal pulses.  Pulmonary/Chest: Effort normal and breath sounds normal. No respiratory distress. She has no wheezes. She has no rales.  Abdominal: Soft. Bowel sounds are normal. She exhibits no distension. There is  abdominal tenderness. There is guarding. There is no rebound.  Epigastrium tender to palpation with guarding  Musculoskeletal:     Comments: Right lower extremity with pedal edema and appears slightly larger in size compared to the left lower extremity.  No erythema or tenderness. Chest pain reproducible on exam.  Neurological: She is alert and oriented to person, place, and time.  Speech fluent, tongue midline, no facial droop. Strength 5 out of 5 in bilateral upper and lower extremities. Sensation to light touch intact throughout.  Skin: Skin is warm and dry. She is not diaphoretic.  Psychiatric:  Agitated     Labs on Admission: I have personally reviewed following labs and imaging studies  CBC: Recent Labs  Lab 11/27/18 1250  WBC 10.0  HGB 11.1*  HCT 36.7  MCV 86.8  PLT 387   Basic Metabolic Panel: Recent Labs  Lab 11/27/18 1250  NA 140  K 4.9  CL 99  CO2 30  GLUCOSE 110*  BUN 70*  CREATININE 2.20*  CALCIUM 9.9   GFR: Estimated Creatinine Clearance: 30.1 mL/min (A) (by C-G formula based on SCr of 2.2 mg/dL (H)). Liver Function Tests: No results for input(s): AST, ALT, ALKPHOS, BILITOT, PROT, ALBUMIN in the last 168 hours. Recent Labs  Lab 11/27/18 1250  LIPASE 43   No results for input(s): AMMONIA in the last 168 hours. Coagulation Profile: No results for input(s): INR, PROTIME in the last 168 hours. Cardiac Enzymes: No results for input(s): CKTOTAL, CKMB, CKMBINDEX, TROPONINI in the last 168 hours. BNP (last 3 results) No results for input(s): PROBNP in the last 8760 hours. HbA1C: No results for input(s): HGBA1C in the last 72 hours. CBG: No results for input(s): GLUCAP in the last 168 hours. Lipid Profile: No results for input(s): CHOL,  HDL, LDLCALC, TRIG, CHOLHDL, LDLDIRECT in the last 72 hours. Thyroid Function Tests: No results for input(s): TSH, T4TOTAL, FREET4, T3FREE, THYROIDAB in the last 72 hours. Anemia Panel: No results for input(s): VITAMINB12, FOLATE, FERRITIN, TIBC, IRON, RETICCTPCT in the last 72 hours. Urine analysis:    Component Value Date/Time   COLORURINE YELLOW 11/27/2018 1523   APPEARANCEUR CLEAR 11/27/2018 1523   LABSPEC 1.008 11/27/2018 1523   PHURINE 5.0 11/27/2018 1523   GLUCOSEU NEGATIVE 11/27/2018 1523   HGBUR NEGATIVE 11/27/2018 1523   BILIRUBINUR NEGATIVE 11/27/2018 1523   KETONESUR NEGATIVE 11/27/2018 1523   PROTEINUR NEGATIVE 11/27/2018 1523   UROBILINOGEN 0.2 08/04/2014 1409   NITRITE NEGATIVE 11/27/2018 1523   LEUKOCYTESUR NEGATIVE 11/27/2018 1523    Radiological Exams on Admission: Dg Chest 2 View  Result Date: 11/27/2018 CLINICAL DATA:  Shortness of breath, chest pain. EXAM: CHEST - 2 VIEW COMPARISON:  Radiographs of October 21, 2018. FINDINGS: Stable cardiomegaly. No pneumothorax or pleural effusion is noted. Both lungs are clear. The visualized skeletal structures are unremarkable. IMPRESSION: No active cardiopulmonary disease. Electronically Signed   By: Marijo Conception, M.D.   On: 11/27/2018 13:18    EKG: Independently reviewed.  Sinus rhythm, nonspecific ST changes, baseline wander in inferior leads.  Assessment/Plan Principal Problem:   Chest pain Active Problems:   DM (diabetes mellitus) (HCC)   CKD (chronic kidney disease), stage III (HCC)   Pre-syncope   Epigastric abdominal pain   Chest pain, presyncope -Hemodynamically stable. -Right lower extremity appears slightly larger in size and edematous compared to the left lower extremity.  D-dimer 1.09.  She is at risk for VTE in the setting of recent hospitalization.  Patient is unable to  get a CT angiogram due to renal insufficiency.  Not hypoxic, tachycardic, or hypotensive.  VQ scan has been ordered.  Check right  lower extremity Doppler to rule out DVT. -Chest x-ray showing no active cardiopulmonary disease. -I-STAT troponin x 2 negative.  EKG with nonspecific ST changes.  Recent echo done 1/14 with diffuse hypokinesis but no regional wall motion abnormality reported.  ?MSK component as chest pain reproducible on exam.  She does have significant risk factors for CAD.  Received full dose aspirin in the ED.  Continue to trend troponin. -No neuro deficit appreciated on exam to explain presyncope. -Cardiac monitoring -Check orthostatics  Epigastric abdominal pain, nausea -Afebrile and no leukocytosis.  Lipase normal.  She did have epigastric tenderness and guarding on palpation but otherwise patient appeared comfortable on exam and was eating a sandwich.  No history of peptic ulcer disease in the chart. -Check LFTs -Abdominal x-ray  CKD 3 -Creatinine 2.2, was 2.0 on 2/7. -Continue to monitor renal function  Chronic anemia -Stable.  Hemoglobin 11.1, at baseline.  Type 2 diabetes  -Check A1c.  Sliding scale insulin sensitive and CBG checks.  Unable to safely order home medications at this time as pharmacy medication reconciliation pending is pending.  DVT prophylaxis: Lovenox Code Status: Patient wishes to be full code. Family Communication: No family available. Disposition Plan: Anticipate discharge after clinical improvement. Consults called: None Admission status: Observation, cardiac telemetry   Shela Leff MD [facility] Pager [phone]  If 7PM-7AM, please contact night-coverage [email or web address] Password [facility]  11/27/2018, 7:39 PM

## 2018-11-27 NOTE — ED Notes (Signed)
This RN assisted the primary RN to get the pt to the restroom. Pt was disrespectful to both RNs calling staff inappropriate names. Family was also laughing at staff while pt was making rude remarks. This RN got pt back in bed after she used the restroom and gave her an extra gown and blankets for comfort.

## 2018-11-27 NOTE — ED Notes (Addendum)
Pt being rude to staff, making inappropriate comments, calling staff members names, and making demands.  This RN attempted to deescalate the situation without success.  Charge RN aware.

## 2018-11-27 NOTE — ED Notes (Signed)
Pt and family member being rude to staff members  Calling staff members names  Staff repeatedly in room and attempting to meet pt needs  This RN heard pt tell Percell Locus RN to leave room and never come back.   Charge RN aware

## 2018-11-27 NOTE — ED Notes (Signed)
Phlebotomy to draw delta Trop

## 2018-11-27 NOTE — ED Notes (Signed)
Pt family still away from bedside, unable to update. Pt updated as to plan of care.

## 2018-11-28 ENCOUNTER — Observation Stay (HOSPITAL_COMMUNITY): Payer: Medicare Other

## 2018-11-28 ENCOUNTER — Encounter (HOSPITAL_COMMUNITY): Payer: Self-pay

## 2018-11-28 ENCOUNTER — Observation Stay (HOSPITAL_BASED_OUTPATIENT_CLINIC_OR_DEPARTMENT_OTHER): Payer: Medicare Other

## 2018-11-28 DIAGNOSIS — N183 Chronic kidney disease, stage 3 (moderate): Secondary | ICD-10-CM | POA: Diagnosis not present

## 2018-11-28 DIAGNOSIS — R55 Syncope and collapse: Secondary | ICD-10-CM | POA: Diagnosis not present

## 2018-11-28 DIAGNOSIS — R7989 Other specified abnormal findings of blood chemistry: Secondary | ICD-10-CM | POA: Diagnosis not present

## 2018-11-28 DIAGNOSIS — R0782 Intercostal pain: Secondary | ICD-10-CM | POA: Diagnosis not present

## 2018-11-28 DIAGNOSIS — R0789 Other chest pain: Secondary | ICD-10-CM | POA: Diagnosis not present

## 2018-11-28 LAB — BASIC METABOLIC PANEL
Anion gap: 11 (ref 5–15)
BUN: 75 mg/dL — ABNORMAL HIGH (ref 8–23)
CO2: 29 mmol/L (ref 22–32)
Calcium: 9.7 mg/dL (ref 8.9–10.3)
Chloride: 100 mmol/L (ref 98–111)
Creatinine, Ser: 2.41 mg/dL — ABNORMAL HIGH (ref 0.44–1.00)
GFR calc Af Amer: 23 mL/min — ABNORMAL LOW (ref >60)
GFR calc non Af Amer: 20 mL/min — ABNORMAL LOW (ref >60)
Glucose, Bld: 146 mg/dL — ABNORMAL HIGH (ref 70–99)
Potassium: 3.7 mmol/L (ref 3.5–5.1)
Sodium: 140 mmol/L (ref 135–145)

## 2018-11-28 LAB — GLUCOSE, CAPILLARY
Glucose-Capillary: 154 mg/dL — ABNORMAL HIGH (ref 70–99)
Glucose-Capillary: 130 mg/dL — ABNORMAL HIGH (ref 70–99)

## 2018-11-28 LAB — HEMOGLOBIN A1C
Hgb A1c MFr Bld: 7 % — ABNORMAL HIGH (ref 4.8–5.6)
Mean Plasma Glucose: 154.2 mg/dL

## 2018-11-28 LAB — TROPONIN I
Troponin I: 0.08 ng/mL (ref <0.03)
Troponin I: 0.08 ng/mL (ref <0.03)

## 2018-11-28 MED ORDER — CARVEDILOL 6.25 MG PO TABS
6.2500 mg | ORAL_TABLET | Freq: Once | ORAL | Status: AC
  Administered 2018-11-28: 6.25 mg via ORAL
  Filled 2018-11-28: qty 1

## 2018-11-28 MED ORDER — TECHNETIUM TC 99M DIETHYLENETRIAME-PENTAACETIC ACID
30.0000 | Freq: Once | INTRAVENOUS | Status: AC | PRN
  Administered 2018-11-28: 30 via RESPIRATORY_TRACT

## 2018-11-28 MED ORDER — OXYCODONE-ACETAMINOPHEN 5-325 MG PO TABS
1.0000 | ORAL_TABLET | Freq: Four times a day (QID) | ORAL | Status: DC | PRN
  Administered 2018-11-28 (×2): 2 via ORAL
  Filled 2018-11-28 (×2): qty 2

## 2018-11-28 MED ORDER — TECHNETIUM TO 99M ALBUMIN AGGREGATED
4.0000 | Freq: Once | INTRAVENOUS | Status: AC | PRN
  Administered 2018-11-28: 4 via INTRAVENOUS

## 2018-11-28 NOTE — Discharge Instructions (Signed)
[place] were in the hospital because of chest pain and passing out. Overnight, the tests we ran did not give a good reason for your symptoms. On my exam, you had a lot of pain when I pressed on your chest. I recommended some pain cream to help with your symptoms. There was no evidence of heart issues or a blood clot, thankfully. Please follow-up with your primary care physician.

## 2018-11-28 NOTE — Progress Notes (Signed)
RLE venous duplex       has been completed. Preliminary results can be found under CV proc through chart review. Que Meneely, BS, RDMS, RVT   

## 2018-11-28 NOTE — Discharge Summary (Signed)
Physician Discharge Summary  [name] [name]:[id number] DOB: [birth year] DOA: 11/27/2018  PCP: [name], MD  Admit date: 11/27/2018 Discharge date: 11/28/2018  Admitted From: Home Disposition: Home  Recommendations for Outpatient Follow-up:  1. Follow up with PCP in 1 week 2. Please obtain BMP/CBC in one week 3. Please follow up on the following pending results: None  Home Health: None Equipment/Devices: None  Discharge Condition: Stable CODE STATUS: Full code Diet recommendation: Renal diet/heart healthy   Brief/Interim Summary:  Admission HPI written by Shela Leff, MD   Chief Complaint: Chest pain, generalized weakness  HPI: [name] [name] is a 68 y.o. female with medical history significant of type 2 diabetes, CKD stage III, hypertension, hyperlipidemia, hypothyroidism, OSA presenting to the hospital for evaluation of chest pain and generalized weakness.  Patient states she sat down to eat breakfast this morning and before she could eat she felt lightheaded and weak all over.  Her aide checked her sugar at that time and it was 137.  Patient does not think she lost consciousness as she could hear her aide call her name the entire time.  States she started vomiting and experience substernal/epigastric pressure.  Symptoms lasted until she arrived to the ED around lunchtime.  Reports having history of clot in her left leg several years ago.  States her right leg is chronically swollen and painful.  Denies having any recent fevers, abdominal pain, diarrhea, or constipation.  Patient was upset about being placed in the emergency room hallway and no additional history could be obtained from her.  Patient was admitted to the hospital from 1/13 to 1/24 for CHF exacerbation.   Hospital course:  Chest pain Pre-syncope Conflicting story as patient states she didn't pass out, but family stated she did. Unsure of etiology. V/Q low probability for PE and LE duplex  negative for DVT. Troponin cycled with flat trend. Transthoracic Echocardiogram recently done so not ordered this admission. Chest pain persisted overnight and is reproducible on exam.  Epigastric pain Nausea Resolved. Abdominal x-ray unremarkable.  CKD stage 3 Slight worsening. Will need repeat BMP. Follow-up with nephrology  Diabetes mellitus, type 2 Continue home regimen  Discharge Diagnoses:  Principal Problem:   Chest pain Active Problems:   DM (diabetes mellitus) (HCC)   CKD (chronic kidney disease), stage III (HCC)   Pre-syncope   Epigastric abdominal pain    Discharge Instructions  Discharge Instructions    Increase activity slowly   Complete by:  As directed      Allergies as of 11/28/2018      Reactions   Nebivolol Swelling   Ace Inhibitors Swelling, Other (See Comments)   Tongue swell   Bystolic [nebivolol Hcl] Swelling, Other (See Comments)   Chest pain    Morphine And Related Itching      Medication List    STOP taking these medications   naftifine 1 % cream Commonly known as:  NAFTIN     TAKE these medications   albuterol (2.5 MG/3ML) 0.083% nebulizer solution Commonly known as:  PROVENTIL Take 2.5 mg by nebulization every 6 (six) hours as needed for wheezing or shortness of breath.   allopurinol 100 MG tablet Commonly known as:  ZYLOPRIM Take 100 mg by mouth every morning.   budesonide-formoterol 160-4.5 MCG/ACT inhaler Commonly known as:  SYMBICORT Inhale 2 puffs into the lungs 2 (two) times daily as needed (for SOB).   CALCIUM 1200 PO Take 1,200 mg by mouth daily.   carvedilol 6.25  MG tablet Commonly known as:  COREG TAKE 1 TABLET BY MOUTH TWICE DAILY, WITH A MEAL What changed:  See the new instructions.   cetirizine 10 MG tablet Commonly known as:  ZYRTEC Take 10 mg by mouth daily as needed for allergies.   clopidogrel 75 MG tablet Commonly known as:  PLAVIX TAKE 1 TABLET BY MOUTH EVERY DAY   Dulaglutide 1.5 MG/0.5ML  Sopn Inject 1.5 mg into the skin every Monday.   fenofibrate 145 MG tablet Commonly known as:  TRICOR TAKE 1 TABLET (145 MG TOTAL) BY MOUTH DAILY. What changed:  See the new instructions.   fluticasone 50 MCG/ACT nasal spray Commonly known as:  FLONASE Place 2 sprays into the nose as needed for allergies.   hydrOXYzine 25 MG tablet Commonly known as:  ATARAX/VISTARIL Take 25 mg by mouth 2 (two) times daily as needed for itching.   insulin lispro protamine-lispro (75-25) 100 UNIT/ML Susp injection Commonly known as:  HUMALOG 75/25 MIX Inject 35-40 Units into the skin See admin instructions. 40units in the morning and 35units in the evening   levothyroxine 150 MCG tablet Commonly known as:  SYNTHROID, LEVOTHROID Take 150 mcg by mouth daily before breakfast.   multivitamin with minerals Tabs tablet Take 1 tablet by mouth daily.   MYRBETRIQ 50 MG Tb24 tablet Generic drug:  mirabegron ER Take 50 mg by mouth daily.   oxyCODONE-acetaminophen 10-325 MG tablet Commonly known as:  PERCOCET Take 1 tablet by mouth every 4 (four) hours as needed for pain.   polyvinyl alcohol 1.4 % ophthalmic solution Commonly known as:  LIQUIFILM TEARS Place 2 drops into both eyes daily as needed for dry eyes.   simvastatin 20 MG tablet Commonly known as:  ZOCOR Take 20 mg by mouth at bedtime.   tiZANidine 4 MG capsule Commonly known as:  ZANAFLEX Take 4 mg by mouth 2 (two) times daily.   torsemide 20 MG tablet Commonly known as:  DEMADEX Take 4 tablets (80 mg total) by mouth every morning AND 2 tablets (40 mg total) every evening.   VITAMIN B COMPLEX PO Take 1 tablet by mouth 2 (two) times daily.       Allergies  Allergen Reactions  . Nebivolol Swelling  . Ace Inhibitors Swelling and Other (See Comments)    Tongue swell  . Bystolic [Nebivolol Hcl] Swelling and Other (See Comments)    Chest pain   . Morphine And Related Itching     Consultations:  None   Procedures/Studies: Dg Chest 2 View  Result Date: 11/27/2018 CLINICAL DATA:  Shortness of breath, chest pain. EXAM: CHEST - 2 VIEW COMPARISON:  Radiographs of October 21, 2018. FINDINGS: Stable cardiomegaly. No pneumothorax or pleural effusion is noted. Both lungs are clear. The visualized skeletal structures are unremarkable. IMPRESSION: No active cardiopulmonary disease. Electronically Signed   By: Marijo Conception, M.D.   On: 11/27/2018 13:18   Nm Pulmonary Vent And Perf (v/q Scan)  Result Date: 11/28/2018 CLINICAL DATA:  Shortness of breath, weakness, D-dimer EXAM: NUCLEAR MEDICINE VENTILATION - PERFUSION LUNG SCAN TECHNIQUE: Ventilation images were obtained in multiple projections using inhaled aerosol Tc-57m DTPA. Perfusion images were obtained in multiple projections after intravenous injection of Tc-59m MAA. RADIOPHARMACEUTICALS:  30.9 mCi of Tc-23m DTPA aerosol inhalation and 4.2 mCi Tc23m MAA IV COMPARISON:  Chest radiograph, 11/27/2018 FINDINGS: Ventilation: No focal ventilation defect. Perfusion: No wedge shaped peripheral perfusion defects to suggest acute pulmonary embolism. Cardiomegaly. IMPRESSION: 1. Very low probability examination for pulmonary embolism  by modified PIOPED criteria (PE absent). 2.  Cardiomegaly. Electronically Signed   By: Eddie Candle M.D.   On: 11/28/2018 08:20   Dg Abd 2 Views  Result Date: 11/27/2018 CLINICAL DATA:  Syncopal episode eating lunch.  Lethargy.  Vomiting. EXAM: ABDOMEN - 2 VIEW COMPARISON:  CT abdomen and pelvis May 06, 2017 FINDINGS: The bowel gas pattern is normal. There is no evidence of free air. No radio-opaque calculi or other significant radiographic abnormality is seen. Surgical and anastomotic staples project in pelvis. Subcentimeter linear density projecting over pelvis most compatible with vascular clips. Status post RIGHT hip total arthroplasty. Phleboliths project in the pelvis. IMPRESSION: Normal bowel gas  pattern. Electronically Signed   By: Elon Alas M.D.   On: 11/27/2018 20:43   Vas [name] Lower Extremity Venous (dvt)  Result Date: 11/28/2018  Lower Venous Study Indications: Edema. Other Indications: D dimer. Limitations: Poor tolerance of venous compressions. Performing Technologist: June Leap RDMS, RVT  Examination Guidelines: A complete evaluation includes B-mode imaging, spectral Doppler, color Doppler, and power Doppler as needed of all accessible portions of each vessel. Bilateral testing is considered an integral part of a complete examination. Limited examinations for reoccurring indications may be performed as noted.  Right Venous Findings: +---------+---------------+---------+-----------+----------+-------+          CompressibilityPhasicitySpontaneityPropertiesSummary +---------+---------------+---------+-----------+----------+-------+ CFV      Full           Yes      Yes                          +---------+---------------+---------+-----------+----------+-------+ SFJ      Full                                                 +---------+---------------+---------+-----------+----------+-------+ FV Prox  Full                                                 +---------+---------------+---------+-----------+----------+-------+ FV Mid   Full                                                 +---------+---------------+---------+-----------+----------+-------+ FV DistalFull                                                 +---------+---------------+---------+-----------+----------+-------+ PFV      Full                                                 +---------+---------------+---------+-----------+----------+-------+ POP      Full           Yes      Yes                          +---------+---------------+---------+-----------+----------+-------+ PTV  Full                                                  +---------+---------------+---------+-----------+----------+-------+ PERO     Full                                                 +---------+---------------+---------+-----------+----------+-------+  Left Venous Findings: +---+---------------+---------+-----------+----------+-------+    CompressibilityPhasicitySpontaneityPropertiesSummary +---+---------------+---------+-----------+----------+-------+ CFVFull           Yes      Yes                          +---+---------------+---------+-----------+----------+-------+    Summary: Right: There is no evidence of deep vein thrombosis in the lower extremity. No cystic structure found in the popliteal fossa. Left: No evidence of common femoral vein obstruction.  *See table(s) above for measurements and observations.    Preliminary       Subjective: Some mild chest pain today  Discharge Exam: Vitals:   11/27/18 2349 11/28/18 0515  BP: 131/63 (!) 125/59  Pulse:  81  Resp:  18  Temp:  98.7 F (37.1 C)  SpO2:  100%   Vitals:   11/27/18 2107 11/27/18 2347 11/27/18 2349 11/28/18 0515  BP: (!) 154/86 (!) 107/43 131/63 (!) 125/59  Pulse: 86 85  81  Resp: 20 18  18   Temp: 98 F (36.7 C) 99.3 F (37.4 C)  98.7 F (37.1 C)  TempSrc: Oral Oral  Oral  SpO2: 100% 93%  100%  Weight: 112.4 kg   113.2 kg  Height: 5\' 4"  (1.626 m)       General: Pt is alert, awake, not in acute distress Chest: reproducible chest pain Cardiovascular: RRR, S1/S2 +, no rubs, no gallops Respiratory: CTA bilaterally, no wheezing, no rhonchi Abdominal: Soft, NT, ND, bowel sounds + Extremities: no edema, no cyanosis    The results of significant diagnostics from this hospitalization (including imaging, microbiology, ancillary and laboratory) are listed below for reference.     Microbiology: No results found for this or any previous visit (from the past 240 hour(s)).   Labs: BNP (last 3 results) Recent Labs    10/20/18 0853 11/14/18 0936   BNP 500.3* 194.1*   Basic Metabolic Panel: Recent Labs  Lab 11/27/18 1250 11/28/18 0614  NA 140 140  K 4.9 3.7  CL 99 100  CO2 30 29  GLUCOSE 110* 146*  BUN 70* 75*  CREATININE 2.20* 2.41*  CALCIUM 9.9 9.7   Liver Function Tests: Recent Labs  Lab 11/27/18 1954  AST 22  ALT 15  ALKPHOS 35*  BILITOT 0.6  PROT 6.1*  ALBUMIN 3.1*   Recent Labs  Lab 11/27/18 1250  LIPASE 43   No results for input(s): AMMONIA in the last 168 hours. CBC: Recent Labs  Lab 11/27/18 1250  WBC 10.0  HGB 11.1*  HCT 36.7  MCV 86.8  PLT 294   Cardiac Enzymes: Recent Labs  Lab 11/27/18 1954 11/28/18 0111 11/28/18 0614  TROPONINI 0.07* 0.08* 0.08*   BNP: Invalid input(s): POCBNP CBG: Recent Labs  Lab 11/27/18 2122 11/28/18 0833  GLUCAP 165* 154*   D-Dimer Recent Labs    11/27/18  1250  DDIMER 1.09*   Hgb A1c Recent Labs    11/28/18 0111  HGBA1C 7.0*   Lipid Profile No results for input(s): CHOL, HDL, LDLCALC, TRIG, CHOLHDL, LDLDIRECT in the last 72 hours. Thyroid function studies No results for input(s): TSH, T4TOTAL, T3FREE, THYROIDAB in the last 72 hours.  Invalid input(s): FREET3 Anemia work up No results for input(s): VITAMINB12, FOLATE, FERRITIN, TIBC, IRON, RETICCTPCT in the last 72 hours. Urinalysis    Component Value Date/Time   COLORURINE YELLOW 11/27/2018 1523   APPEARANCEUR CLEAR 11/27/2018 1523   LABSPEC 1.008 11/27/2018 1523   PHURINE 5.0 11/27/2018 1523   GLUCOSEU NEGATIVE 11/27/2018 1523   HGBUR NEGATIVE 11/27/2018 1523   [place] 11/27/2018 1523   [place] 11/27/2018 1523   PROTEINUR NEGATIVE 11/27/2018 1523   UROBILINOGEN 0.2 08/04/2014 1409   NITRITE NEGATIVE 11/27/2018 1523   [place] 11/27/2018 1523     SIGNED:   Cordelia Poche, MD [facility] 11/28/2018, 12:58 PM

## 2018-12-12 ENCOUNTER — Ambulatory Visit (HOSPITAL_COMMUNITY)
Admission: RE | Admit: 2018-12-12 | Discharge: 2018-12-12 | Disposition: A | Discharge status: Home / Self Care | Payer: Medicare Other | Source: Ambulatory Visit | Attending: Cardiology | Admitting: Cardiology

## 2018-12-12 ENCOUNTER — Other Ambulatory Visit: Payer: Self-pay

## 2018-12-12 ENCOUNTER — Encounter (HOSPITAL_COMMUNITY): Payer: Self-pay

## 2018-12-12 VITALS — BP 130/82 | HR 80 | Wt 255.1 lb

## 2018-12-12 DIAGNOSIS — Z79899 Other long term (current) drug therapy: Secondary | ICD-10-CM | POA: Insufficient documentation

## 2018-12-12 DIAGNOSIS — I272 Pulmonary hypertension, unspecified: Secondary | ICD-10-CM | POA: Diagnosis not present

## 2018-12-12 DIAGNOSIS — G4733 Obstructive sleep apnea (adult) (pediatric): Secondary | ICD-10-CM | POA: Diagnosis not present

## 2018-12-12 DIAGNOSIS — M109 Gout, unspecified: Secondary | ICD-10-CM | POA: Diagnosis not present

## 2018-12-12 DIAGNOSIS — Z794 Long term (current) use of insulin: Secondary | ICD-10-CM | POA: Diagnosis not present

## 2018-12-12 DIAGNOSIS — Z720 Tobacco use: Secondary | ICD-10-CM

## 2018-12-12 DIAGNOSIS — Z6841 Body Mass Index (BMI) 40.0 and over, adult: Secondary | ICD-10-CM | POA: Diagnosis not present

## 2018-12-12 DIAGNOSIS — I130 Hypertensive heart and chronic kidney disease with heart failure and stage 1 through stage 4 chronic kidney disease, or unspecified chronic kidney disease: Secondary | ICD-10-CM

## 2018-12-12 DIAGNOSIS — E1151 Type 2 diabetes mellitus with diabetic peripheral angiopathy without gangrene: Secondary | ICD-10-CM | POA: Diagnosis not present

## 2018-12-12 DIAGNOSIS — I10 Essential (primary) hypertension: Secondary | ICD-10-CM

## 2018-12-12 DIAGNOSIS — Z8249 Family history of ischemic heart disease and other diseases of the circulatory system: Secondary | ICD-10-CM | POA: Diagnosis not present

## 2018-12-12 DIAGNOSIS — F17210 Nicotine dependence, cigarettes, uncomplicated: Secondary | ICD-10-CM | POA: Diagnosis not present

## 2018-12-12 DIAGNOSIS — I447 Left bundle-branch block, unspecified: Secondary | ICD-10-CM | POA: Diagnosis not present

## 2018-12-12 DIAGNOSIS — I739 Peripheral vascular disease, unspecified: Secondary | ICD-10-CM

## 2018-12-12 DIAGNOSIS — Z809 Family history of malignant neoplasm, unspecified: Secondary | ICD-10-CM | POA: Insufficient documentation

## 2018-12-12 DIAGNOSIS — E039 Hypothyroidism, unspecified: Secondary | ICD-10-CM | POA: Insufficient documentation

## 2018-12-12 DIAGNOSIS — Z8673 Personal history of transient ischemic attack (TIA), and cerebral infarction without residual deficits: Secondary | ICD-10-CM | POA: Diagnosis not present

## 2018-12-12 DIAGNOSIS — Z833 Family history of diabetes mellitus: Secondary | ICD-10-CM | POA: Diagnosis not present

## 2018-12-12 DIAGNOSIS — J45909 Unspecified asthma, uncomplicated: Secondary | ICD-10-CM | POA: Diagnosis not present

## 2018-12-12 DIAGNOSIS — E7800 Pure hypercholesterolemia, unspecified: Secondary | ICD-10-CM

## 2018-12-12 DIAGNOSIS — E785 Hyperlipidemia, unspecified: Secondary | ICD-10-CM | POA: Diagnosis not present

## 2018-12-12 DIAGNOSIS — E669 Obesity, unspecified: Secondary | ICD-10-CM

## 2018-12-12 DIAGNOSIS — Z96649 Presence of unspecified artificial hip joint: Secondary | ICD-10-CM | POA: Insufficient documentation

## 2018-12-12 DIAGNOSIS — Z7902 Long term (current) use of antithrombotics/antiplatelets: Secondary | ICD-10-CM | POA: Insufficient documentation

## 2018-12-12 DIAGNOSIS — N184 Chronic kidney disease, stage 4 (severe): Secondary | ICD-10-CM | POA: Insufficient documentation

## 2018-12-12 DIAGNOSIS — I5042 Chronic combined systolic (congestive) and diastolic (congestive) heart failure: Secondary | ICD-10-CM | POA: Diagnosis not present

## 2018-12-12 DIAGNOSIS — E1122 Type 2 diabetes mellitus with diabetic chronic kidney disease: Secondary | ICD-10-CM | POA: Diagnosis not present

## 2018-12-12 DIAGNOSIS — I5023 Acute on chronic systolic (congestive) heart failure: Secondary | ICD-10-CM

## 2018-12-12 DIAGNOSIS — I779 Disorder of arteries and arterioles, unspecified: Secondary | ICD-10-CM

## 2018-12-12 LAB — BASIC METABOLIC PANEL
Anion gap: 11 (ref 5–15)
BUN: 76 mg/dL — ABNORMAL HIGH (ref 8–23)
CO2: 29 mmol/L (ref 22–32)
Calcium: 10.5 mg/dL — ABNORMAL HIGH (ref 8.9–10.3)
Chloride: 99 mmol/L (ref 98–111)
Creatinine, Ser: 2.38 mg/dL — ABNORMAL HIGH (ref 0.44–1.00)
GFR calc Af Amer: 23 mL/min — ABNORMAL LOW (ref >60)
GFR calc non Af Amer: 20 mL/min — ABNORMAL LOW (ref >60)
Glucose, Bld: 62 mg/dL — ABNORMAL LOW (ref 70–99)
Potassium: 3.8 mmol/L (ref 3.5–5.1)
Sodium: 139 mmol/L (ref 135–145)

## 2018-12-12 NOTE — Patient Instructions (Signed)
Routine lab work today. Will notify you of abnormal results  Follow up in 8-10 weeks

## 2018-12-12 NOTE — Progress Notes (Signed)
PCP: Primary Cardiologist: Dr Gwenlyn Found  Nephrology: Dr Florene Glen HF MD: Dr Aundra Dubin  HPI: [name] Hakala Patrickis a 68 y.o.femalewith a history of TIAs, tobacco use, HTN, DM, HL, PAD followed by Dr Oneida Alar, chronic diastolic HF, CKD IV followed by Dr Virgina Jock, morbid obesity,amoker, and LBBB.  Admitted 10/20/2018 with volume overload despite high dose diuretics at home(lasix 160 bid + metolazone 5 mg 3x/week). Prior to admit she ran out of metolazone.  Hospital course was complicated by AKI on CKD. Diuresed with IV lasix and transitioned to torsemide 80 mg /40 mg.    Admitted 2/20-2/21/20 with SOB and chest pressure in the setting of running out of her diuretics for 3-4 days and with ? Syncopal episode. Work up was unremarkable, troponin flat. Meds resumed.   She presents today for regular follow up. Feeling better. No further chest pressure. Saw renal earlier this week and instructed to take extra torsemide for 3 days. She will finish tomorrow. Feeling better. Denies PND/Orthopnea. She is watching her salt and fluid intake and trying to lose weight. Use to weigh > 300 lbs. She is also trying to exercise. She was previously diagnosed with OSA, but doesn't tolerate CPAP. Reports taking all medication as directed.   Cardiac Test ECHO 10/2018  EF 45-50%  Echo 04/2015: EF 60-65%, grade 1 DD  LHC 07/02/14: essentially normal coronary arteries and EF of 40-45% Myoview stress test 06/24/14: high risk with anterolateral ischemia.   RHC Procedural Findings (1/20): Hemodynamics (mmHg) RA mean 9 RV 45/11 PA 42/17, mean 27 PCWP mean 18 Oxygen saturations: PA 62% AO 96% Cardiac Output (Fick) 5.79  Cardiac Index (Fick) 2.64 PVR 1.6 WU  Review of systems complete and found to be negative unless listed in HPI.    SH:  Social History   Socioeconomic History  . Marital status: Legally Separated    Spouse name: Not on file  . Number of children: 1  . Years of education: college  .  Highest education level: Not on file  Occupational History    Comment: Disabled  Social Needs  . Financial resource strain: Not on file  . Food insecurity:    Worry: Not on file    Inability: Not on file  . Transportation needs:    Medical: Not on file    Non-medical: Not on file  Tobacco Use  . Smoking status: Current Some Day Smoker    Packs/day: 0.25    Years: 40.00    Pack years: 10.00    Types: Cigarettes  . Smokeless tobacco: Never Used  Substance and Sexual Activity  . Alcohol use: Yes    Alcohol/week: 1.0 standard drinks    Types: 1 Standard drinks or equivalent per week    Comment: OCC  . Drug use: No    Comment: Quit four years ago.  Marland Kitchen Sexual activity: Not on file  Lifestyle  . Physical activity:    Days per week: Not on file    Minutes per session: Not on file  . Stress: Not on file  Relationships  . Social connections:    Talks on phone: Not on file    Gets together: Not on file    Attends religious service: Not on file    Active member of club or organization: Not on file    Attends meetings of clubs or organizations: Not on file    Relationship status: Not on file  . Intimate partner violence:    Fear of current or ex partner: Not on  file    Emotionally abused: Not on file    Physically abused: Not on file    Forced sexual activity: Not on file  Other Topics Concern  . Not on file  Social History Narrative   Patient is separated  and lives at home with her friend. Patient is disabled.   Education one year of college   Right handed   Caffeine two cups daily.       FH:  Family History  Problem Relation Age of Onset  . Diabetes Brother   . Cardiomyopathy Mother   . Heart disease Mother   . Cancer Father     Past Medical History:  Diagnosis Date  . Anemia   . Arthritis   . Asthma   . Bronchitis   . Chronic kidney disease   . Chronic kidney disease   . Chronic pain syndrome   . Cramping of feet   . Diabetes mellitus   . GERD  (gastroesophageal reflux disease)   . Gout   . Headache   . History of hip replacement, total   . History of transfusion   . Hx MRSA infection    abscess left groin  . Hx of cardiac cath 06/2014  . Hyperlipidemia   . Hypertension   . Hypothyroid   . 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 Morbidly obese ([place])   . Peripheral vascular disease ([place])   . Positive cardiac stress test 12/2013   anterior and lateral ischemia on Myoview  . Sleep apnea    UNABLE TO TOLERATE C PAP  . Stress incontinence   . Thyroid disease   . TIA (transient ischemic attack)     X2 NO RESIDUAL PROBLEMS    Current Outpatient Medications  Medication Sig Dispense Refill  . albuterol (PROVENTIL) (2.5 MG/3ML) 0.083% nebulizer solution Take 2.5 mg by nebulization every 6 (six) hours as needed for wheezing or shortness of breath.    . allopurinol (ZYLOPRIM) 100 MG tablet Take 100 mg by mouth every morning.     . B Complex Vitamins (VITAMIN B COMPLEX PO) Take 1 tablet by mouth 2 (two) times daily.     . budesonide-formoterol (SYMBICORT) 160-4.5 MCG/ACT inhaler Inhale 2 puffs into the lungs 2 (two) times daily as needed (for SOB).     . Calcium Carbonate-Vit D-Min (CALCIUM 1200 PO) Take 1,200 mg by mouth daily.     . carvedilol (COREG) 6.25 MG tablet TAKE 1 TABLET BY MOUTH TWICE DAILY, WITH A MEAL 180 tablet 3  . cetirizine (ZYRTEC) 10 MG tablet Take 10 mg by mouth daily as needed for allergies.     Marland Kitchen clopidogrel (PLAVIX) 75 MG tablet TAKE 1 TABLET BY MOUTH EVERY DAY 30 tablet 0  . Dulaglutide 1.5 MG/0.5ML SOPN Inject 1.5 mg into the skin every Monday.     . fenofibrate (TRICOR) 145 MG tablet TAKE 1 TABLET (145 MG TOTAL) BY MOUTH DAILY. 90 tablet 2  . fluticasone (FLONASE) 50 MCG/ACT nasal spray Place 2 sprays into the nose as needed for allergies.     . hydrOXYzine (ATARAX/VISTARIL) 25 MG tablet Take 25 mg by mouth 2 (two) times daily as needed for itching.    . insulin lispro protamine-lispro (HUMALOG 75/25 MIX)  (75-25) 100 UNIT/ML SUSP injection Inject 35-40 Units into the skin See admin instructions. 40units in the morning and 35units in the evening    . levothyroxine (SYNTHROID, LEVOTHROID) 150 MCG tablet Take 150 mcg by mouth daily before breakfast.    .  mirabegron ER (MYRBETRIQ) 50 MG TB24 tablet Take 50 mg by mouth daily.    . Multiple Vitamin (MULTIVITAMIN WITH MINERALS) TABS tablet Take 1 tablet by mouth daily.    Marland Kitchen oxyCODONE-acetaminophen (PERCOCET) 10-325 MG tablet Take 1 tablet by mouth every 4 (four) hours as needed for pain.    . polyvinyl alcohol (LIQUIFILM TEARS) 1.4 % ophthalmic solution Place 2 drops into both eyes daily as needed for dry eyes.    . simvastatin (ZOCOR) 20 MG tablet Take 20 mg by mouth at bedtime.  1  . tiZANidine (ZANAFLEX) 4 MG capsule Take 4 mg by mouth 2 (two) times daily.     Marland Kitchen torsemide (DEMADEX) 20 MG tablet Take 4 tablets (80 mg total) by mouth every morning AND 2 tablets (40 mg total) every evening. 180 tablet 0   No current facility-administered medications for this encounter.    Vitals:   12/12/18 1117  BP: 130/82  Pulse: 80  SpO2: 94%  Weight: 115.7 kg (255 lb 2 oz)   Wt Readings from Last 3 Encounters:  12/12/18 115.7 kg (255 lb 2 oz)  11/28/18 113.2 kg (249 lb 9.6 oz)  11/14/18 114.8 kg (253 lb)    PHYSICAL EXAM: General: Obese and fatigued appearing.  HEENT: Normal Neck: Supple. JVP difficult, thick. Appears at least 8-9 cm. Carotids 2+ bilat; no bruits. No thyromegaly or nodule noted. Cor: PMI nondisplaced. RRR, No M/G/R noted Lungs: CTAB, normal effort. Abdomen: Soft, non-tender, non-distended, no HSM. No bruits or masses. +BS  Extremities: No cyanosis, clubbing, or rash. Trace BLE edema.  Neuro: Alert & orientedx3, cranial nerves grossly intact. moves all 4 extremities w/o difficulty. Affect pleasant   ASSESSMENT & PLAN: 1. Chronic combined(primary diastolic)HF - LHC 3009 with normal coronaries.Echo 10/2018: EF 45-50%, diffuse HK, LA  mildly dilated. RV normal  - RHC 10/27/18 with only mildly elevated filling pressures, mild pulmonary HTN, and good CO.  - NYHA III-IIIb symptoms. Confounded by deconditioning and immobility.  - Continue torsemide 80 mg BID x 3 days, then continued 80 mg q am and 40 mg q pm.  - Continue coreg 6.25 mg twice a day.  - Did not tolerate Bidil with hypotension.   2. CKD - baseline CKD IV  - Creatinine baseline 2-2.4 - Followed by [place] today.   3.. Tobacco use -Current tobacco abuse. Discussed smoking cessation. No change.   4. Obesity - Body mass index is 43.79 kg/m.  - Discussed portion control.   5. ?OSA - Needs sleep study but she does not want to pursue.    She refuses.   6.Hx of TIA - Continue plavix and simvastatin.No change.    7. HTN - Meds as above. Diuresing.   Doing well after recent admission in setting of running out of her medications. Has them all back. Taking extra diuretics per renal. Strongly encouraged weight loss. Labs today. RTC 2 months. Sooner with symptoms.   Shirley Friar, PA-C  11:24 AM   Greater than 50% of the 25 minute visit was spent in counseling/coordination of care regarding disease state education, salt/fluid restriction, sliding scale diuretics, and medication compliance.

## 2018-12-31 ENCOUNTER — Ambulatory Visit: Payer: Medicare Other

## 2019-01-22 ENCOUNTER — Telehealth: Payer: Self-pay | Admitting: *Deleted

## 2019-01-22 NOTE — Telephone Encounter (Signed)
Covid-19 travel screening questions  Have you traveled in the last 14 days? If yes where? No Do you now or have you had a fever in the last 14 days? No Do you have any respiratory symptoms of shortness of breath or cough now or in the last 14 days? No Do you have any family members or close contacts with diagnosed or suspected Covid-19? No      

## 2019-01-23 ENCOUNTER — Other Ambulatory Visit: Payer: Self-pay

## 2019-01-23 ENCOUNTER — Ambulatory Visit (INDEPENDENT_AMBULATORY_CARE_PROVIDER_SITE_OTHER)
Admission: RE | Admit: 2019-01-23 | Discharge: 2019-01-23 | Disposition: A | Discharge status: Home / Self Care | Payer: Medicare Other | Source: Ambulatory Visit | Attending: Pulmonary Disease | Admitting: Pulmonary Disease

## 2019-01-23 DIAGNOSIS — R918 Other nonspecific abnormal finding of lung field: Secondary | ICD-10-CM

## 2019-01-29 ENCOUNTER — Ambulatory Visit (INDEPENDENT_AMBULATORY_CARE_PROVIDER_SITE_OTHER): Payer: Medicare Other | Admitting: Primary Care

## 2019-01-29 ENCOUNTER — Other Ambulatory Visit: Payer: Self-pay

## 2019-01-29 ENCOUNTER — Encounter: Payer: Self-pay | Admitting: Primary Care

## 2019-01-29 ENCOUNTER — Ambulatory Visit: Payer: Medicare Other | Admitting: Pulmonary Disease

## 2019-01-29 DIAGNOSIS — Z720 Tobacco use: Secondary | ICD-10-CM

## 2019-01-29 DIAGNOSIS — J45909 Unspecified asthma, uncomplicated: Secondary | ICD-10-CM | POA: Diagnosis not present

## 2019-01-29 DIAGNOSIS — R911 Solitary pulmonary nodule: Secondary | ICD-10-CM

## 2019-01-29 NOTE — Patient Instructions (Addendum)
Asthma  - Recommend using Symbicort twice a day scheduled - Needs pulmonary function testing to determine whether you have COPD   Solitary pulmonary nodule - CT chest showed pulmonary nodule has not changed in size since January - Needs PET scan in 3 months per Dr. Elsworth Soho (due July 2020)  Tobacco abuse - Continues smoking 0.25 pack per day - Smoking cessation discussed at length   Follow up: - Dr. Elsworth Soho in 3 months with PFTs (preferably after PET scan)   Steps to Quit Smoking  Smoking tobacco can be bad for your health. It can also affect almost every organ in your body. Smoking puts you and people around you at risk for many serious long-lasting (chronic) diseases. Quitting smoking is hard, but it is one of the best things that you can do for your health. It is never too late to quit. What are the benefits of quitting smoking? When you quit smoking, you lower your risk for getting serious diseases and conditions. They can include:  Lung cancer or lung disease.  Heart disease.  Stroke.  Heart attack.  Not being able to have children (infertility).  Weak bones (osteoporosis) and broken bones (fractures). If you have coughing, wheezing, and shortness of breath, those symptoms may get better when you quit. You may also get sick less often. If you are pregnant, quitting smoking can help to lower your chances of having a baby of low birth weight. What can I do to help me quit smoking? Talk with your doctor about what can help you quit smoking. Some things you can do (strategies) include:  Quitting smoking totally, instead of slowly cutting back how much you smoke over a period of time.  Going to in-person counseling. You are more likely to quit if you go to many counseling sessions.  Using resources and support systems, such as: ? Database administrator with a Social worker. ? Phone quitlines. ? Careers information officer. ? Support groups or group counseling. ? Text messaging programs. ?  Mobile phone apps or applications.  Taking medicines. Some of these medicines may have nicotine in them. If you are pregnant or breastfeeding, do not take any medicines to quit smoking unless your doctor says it is okay. Talk with your doctor about counseling or other things that can help you. Talk with your doctor about using more than one strategy at the same time, such as taking medicines while you are also going to in-person counseling. This can help make quitting easier. What things can I do to make it easier to quit? Quitting smoking might feel very hard at first, but there is a lot that you can do to make it easier. Take these steps:  Talk to your family and friends. Ask them to support and encourage you.  Call phone quitlines, reach out to support groups, or work with a Social worker.  Ask people who smoke to not smoke around you.  Avoid places that make you want (trigger) to smoke, such as: ? Bars. ? Parties. ? Smoke-break areas at work.  Spend time with people who do not smoke.  Lower the stress in your life. Stress can make you want to smoke. Try these things to help your stress: ? Getting regular exercise. ? Deep-breathing exercises. ? Yoga. ? Meditating. ? Doing a body scan. To do this, close your eyes, focus on one area of your body at a time from head to toe, and notice which parts of your body are tense. Try to relax the  muscles in those areas.  Download or buy apps on your mobile phone or tablet that can help you stick to your quit plan. There are many free apps, such as QuitGuide from the [employer] Office manager for Disease Control and Prevention). You can find more support from smokefree.gov and other websites. This information is not intended to replace advice given to you by your health care provider. Make sure you discuss any questions you have with your health care provider. Document Released: 07/21/2009 Document Revised: 05/22/2016 Document Reviewed: 02/08/2015 Elsevier  Interactive Patient Education  2019 [employer].

## 2019-01-29 NOTE — Progress Notes (Signed)
Virtual Visit via Telephone Note  I connected with [name] [name] on 01/29/19 at 11:00 AM EDT by telephone and verified that I am speaking with the correct person using two identifiers.   I discussed the limitations, risks, security and privacy concerns of performing an evaluation and management service by telephone and the availability of in person appointments. I also discussed with the patient that there may be a patient responsible charge related to this service. The patient expressed understanding and agreed to proceed.   History of Present Illness: 68 year old female, current smoker.  PMH significant for asthma, OSA, solitary pulmonary nodule. CT chest  in January showed a new 11 x 74mm LLL nodule.   01/29/2019 Patient called today for 3 month follow-up. She is doing well, continues to smoke .25 pack daily. Gets a little short of breath at rest and on exertion. She only uses Symbicort as needed. Discussed CT Chest results from April which showed left lower lobe nodule remains stable in size, however, continue surveillance is indicated. Dr. Elsworth Soho recommending PET scan in 3 months (July). Discussed smoking cessation at length. Denies hemoptysis.   Observations/Objective:  - No shortness of breath, wheezing or cough  Assessment and Plan:  Asthma  - Recommend using Symbicort twice a day scheduled - Needs PFTs to determine whether she has COPD  Solitary pulmonary nodule - CT chest LLL nodule has not changed in size since January, dimensions 61mm x 35mm  - Needs PET scan in 3 months (due July 2020)  Tobacco abuse - Continues smoking 0.25 pack per day - Smoking cessation discussed at length   Follow Up Instructions:  - Follow up with Dr. Elsworth Soho in 3 months with PFTs (preferably after PET scan)   I discussed the assessment and treatment plan with the patient. The patient was provided an opportunity to ask questions and all were answered. The patient agreed with the plan and  demonstrated an understanding of the instructions.   The patient was advised to call back or seek an in-person evaluation if the symptoms worsen or if the condition fails to improve as anticipated.  I provided 22 minutes of non-face-to-face time during this encounter.   Martyn Ehrich, NP

## 2019-02-03 ENCOUNTER — Ambulatory Visit: Payer: Medicare Other

## 2019-02-05 NOTE — Progress Notes (Signed)
Heart Failure TeleHealth Note  Due to national recommendations of social distancing due to [place] 19, telehealth visit is felt to be most appropriate for this patient at this time.  I discussed the limitations, risks, security and privacy concerns of performing an evaluation and management service by telephone and the availability of in person appointments. I also discussed with the patient that there may be a patient responsible charge related to this service. The patient expressed understanding and agreed to proceed.   ID:  [name] [name], DOB Feb 19, 1951, MRN [id number]  Location: Home  Provider location: [address] [place] Type of Visit: Established patient   PCP:  [name], MD  Cardiologist:  No primary care provider on file. Primary HF: Dr Aundra Dubin  Chief Complaint: HF follow up   History of Present Illness: [name] Britz Patrickis a 68 y.o.femalewith a history of TIAs, tobacco use, HTN, DM, HL, PAD followed by Dr Oneida Alar, chronic diastolic HF, CKD IV followed by Dr Virgina Jock, morbid obesity,smoker, and LBBB.  Admitted 10/20/2018 with volume overload despite high dose diuretics at home(lasix 160 bid + metolazone 5 mg 3x/week). Prior to admit she ran out of metolazone.  Hospital course was complicated by AKI on CKD. Diuresed with IV lasix and transitioned to torsemide 80 mg /40 mg.    Admitted 2/20-2/21/20 with SOB and chest pressure in the setting of running out of her diuretics for 3-4 days and with ? Syncopal episode. Work up was unremarkable, troponin flat. Meds resumed.   Patient presents via audio conferencing for a telehealth visit today. She was last seen in HF clinic 12/2018 and was asked to take extra torsemide for a few days. Overall doing okay. Getting SOB with ADLs and walking short distances. Has had some trouble with extra fluid lately. Weight has been trending up and got up to 260 lbs. She took extra 40 mg torsemide last night and is  down 3 lbs to 257 lbs. Good UOP. +BLE edema, but improving. No orthopnea or PND. Appetite poor. No cough or fever. Taking all medications. She has been eating out a lot. Back has been hurting her so she hasn't been cooking. BP 138/81, HR 80 on home check.   Pt denies symptoms of cough, fevers, chills, or new SOB worrisome for COVID 19.    ECHO 10/2018  EF 45-50%   Past Medical History:  Diagnosis Date  . Anemia   . Arthritis   . Asthma   . Bronchitis   . Chronic kidney disease   . Chronic kidney disease   . Chronic pain syndrome   . Cramping of feet   . Diabetes mellitus   . GERD (gastroesophageal reflux disease)   . Gout   . Headache   . History of hip replacement, total   . History of transfusion   . Hx MRSA infection    abscess left groin  . Hx of cardiac cath 06/2014  . Hyperlipidemia   . Hypertension   . Hypothyroid   . 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 Morbidly obese ([place])   . Peripheral vascular disease ([place])   . Positive cardiac stress test 12/2013   anterior and lateral ischemia on Myoview  . Sleep apnea    UNABLE TO TOLERATE C PAP  . Stress incontinence   . Thyroid disease   . TIA (transient ischemic attack)     X2 NO RESIDUAL PROBLEMS   Past Surgical History:  Procedure Laterality Date  .  ABDOMINAL HYSTERECTOMY    . COLON SURGERY  1995   DUE TO POLYP  . FOREARM FRACTURE SURGERY     Left arm  . HERNIA REPAIR     w/ mesh  . INCISION AND DRAINAGE ABSCESS Left 02/04/2013   Procedure: INCISION AND DRAINAGE LEFT BUTTOCK ABSCESS; INCISION AND DRAINAGE LEFT BREAST ABSCESS;  Surgeon: Harl Bowie, MD;  Location: [facility];  Service: General;  Laterality: Left;  . INCISION AND DRAINAGE ABSCESS N/A 02/12/2013   Procedure: INCISION AND DEBRIDEMENT BUTTOCK WOUND ;  Surgeon: [name]. Georgette Dover, MD;  Location: [facility];  Service: General;  Laterality: N/A;  . INCISION AND DRAINAGE ABSCESS N/A 02/14/2013   Procedure: INCISION AND DRAINAGE/DRESSING CHANGE;  Surgeon: Harl Bowie, MD;  Location: [facility];  Service: General;  Laterality: N/A;  . INCISION AND DRAINAGE ABSCESS N/A 03/21/2015   Procedure: INCISION AND DRAINAGE PUBIC ABSCESS;  Surgeon: Excell Seltzer, MD;  Location: [facility];  Service: General;  Laterality: N/A;  . INCISION AND DRAINAGE PERIRECTAL ABSCESS Left 02/10/2013   Procedure: IRRIGATION AND DEBRIDEMENT OF BUTTOCK/PERINEAL ABSCESS;  Surgeon: [name]. Georgette Dover, MD;  Location: [facility];  Service: General;  Laterality: Left;  . INCISION AND DRAINAGE PERIRECTAL ABSCESS N/A 02/16/2013   Procedure: IRRIGATION AND DEBRIDEMENT PERINEAL ABSCESS;  Surgeon: Zenovia Jarred, MD;  Location: [facility];  Service: General;  Laterality: N/A;  . IRRIGATION AND DEBRIDEMENT ABSCESS N/A 02/06/2013   Procedure: IRRIGATION AND DEBRIDEMENT BUTTOCK ABSCESS AND DRESSING CHANGE;  Surgeon: Harl Bowie, MD;  Location: [facility];  Service: General;  Laterality: N/A;  . IRRIGATION AND DEBRIDEMENT ABSCESS Left 02/08/2013   Procedure: IRRIGATION AND DEBRIDEMENT ABSCESS/DRESSING CHANGE;  Surgeon: Gwenyth Ober, MD;  Location: [facility];  Service: General;  Laterality: Left;  . JOINT REPLACEMENT  2010 / 2012   . LAPAROSCOPIC CHOLECYSTECTOMY    . LEFT HEART CATHETERIZATION WITH CORONARY ANGIOGRAM N/A 07/02/2014   Procedure: LEFT HEART CATHETERIZATION WITH CORONARY ANGIOGRAM;  Surgeon: Lorretta Harp, MD;  Location: [facility];  Service: Cardiovascular;  Laterality: N/A;  . LOWER EXTREMITY ANGIOGRAM Right 04/20/2016   Procedure: Lower Extremity Angiogram;  Surgeon: Elam Dutch, MD;  Location: [facility];  Service: Cardiovascular;  Laterality: Right;  . PERIPHERAL VASCULAR CATHETERIZATION N/A 04/20/2016   Procedure: Abdominal Aortogram;  Surgeon: Elam Dutch, MD;  Location: [facility];  Service: Cardiovascular;  Laterality: N/A;  . PERIPHERAL VASCULAR CATHETERIZATION Right 04/20/2016   Procedure: Peripheral Vascular Intervention;  Surgeon: Elam Dutch, MD;  Location:  [facility];  Service: Cardiovascular;  Laterality: Right;  popiteal  . RIGHT HEART CATH N/A 10/27/2018   Procedure: RIGHT HEART CATH;  Surgeon: Larey Dresser, MD;  Location: [facility];  Service: Cardiovascular;  Laterality: N/A;  . THYROIDECTOMY    . TOTAL HIP REVISION Right 08/11/2014   Procedure: RIGHT ACETABULAR REVISION;  Surgeon: Gearlean Alf, MD;  Location: [facility];  Service: Orthopedics;  Laterality: Right;     Current Outpatient Medications  Medication Sig Dispense Refill  . carvedilol (COREG) 6.25 MG tablet TAKE 1 TABLET BY MOUTH TWICE DAILY, WITH A MEAL 180 tablet 3  . clopidogrel (PLAVIX) 75 MG tablet TAKE 1 TABLET BY MOUTH EVERY DAY 30 tablet 0  . simvastatin (ZOCOR) 20 MG tablet Take 20 mg by mouth at bedtime.  1  . torsemide (DEMADEX) 20 MG tablet Take 4 tablets (80 mg total) by mouth every morning AND 2  tablets (40 mg total) every evening. 180 tablet 0  . albuterol (PROVENTIL) (2.5 MG/3ML) 0.083% nebulizer solution Take 2.5 mg by nebulization every 6 (six) hours as needed for wheezing or shortness of breath.    . allopurinol (ZYLOPRIM) 100 MG tablet Take 100 mg by mouth every morning.     . B Complex Vitamins (VITAMIN B COMPLEX PO) Take 1 tablet by mouth 2 (two) times daily.     . budesonide-formoterol (SYMBICORT) 160-4.5 MCG/ACT inhaler Inhale 2 puffs into the lungs 2 (two) times daily as needed (for SOB).     . Calcium Carbonate-Vit D-Min (CALCIUM 1200 PO) Take 1,200 mg by mouth daily.     . cetirizine (ZYRTEC) 10 MG tablet Take 10 mg by mouth daily as needed for allergies.     . Dulaglutide 1.5 MG/0.5ML SOPN Inject 1.5 mg into the skin every Monday.     . fenofibrate (TRICOR) 145 MG tablet TAKE 1 TABLET (145 MG TOTAL) BY MOUTH DAILY. 90 tablet 2  . fluticasone (FLONASE) 50 MCG/ACT nasal spray Place 2 sprays into the nose as needed for allergies.     . hydrOXYzine (ATARAX/VISTARIL) 25 MG tablet Take 25 mg by mouth 2 (two) times daily as needed for itching.     . insulin lispro protamine-lispro (HUMALOG 75/25 MIX) (75-25) 100 UNIT/ML SUSP injection Inject 35-40 Units into the skin See admin instructions. 40units in the morning and 35units in the evening    . levothyroxine (SYNTHROID, LEVOTHROID) 150 MCG tablet Take 150 mcg by mouth daily before breakfast.    . mirabegron ER (MYRBETRIQ) 50 MG TB24 tablet Take 50 mg by mouth daily.    . Multiple Vitamin (MULTIVITAMIN WITH MINERALS) TABS tablet Take 1 tablet by mouth daily.    Marland Kitchen oxyCODONE-acetaminophen (PERCOCET) 10-325 MG tablet Take 1 tablet by mouth every 4 (four) hours as needed for pain.    . polyvinyl alcohol (LIQUIFILM TEARS) 1.4 % ophthalmic solution Place 2 drops into both eyes daily as needed for dry eyes.    Marland Kitchen tiZANidine (ZANAFLEX) 4 MG capsule Take 4 mg by mouth 2 (two) times daily.      No current facility-administered medications for this encounter.     Allergies:   Nebivolol; Ace inhibitors; Bystolic [nebivolol hcl]; and Morphine and related   Social History:  The patient  reports that she has been smoking cigarettes. She has a 10.00 pack-year smoking history. She has never used smokeless tobacco. She reports current alcohol use of about 1.0 standard drinks of alcohol per week. She reports that she does not use drugs.   Family History:  The patient's family history includes Cancer in her father; Cardiomyopathy in her mother; Diabetes in her brother; Heart disease in her mother.   ROS:  Please see the history of present illness.   All other systems are personally reviewed and negative.    Exam:  (Video/Tele Health Call; Exam is subjective and or/visual.) General:  Speaks in full sentences. No resp difficulty. Lungs: Normal respiratory effort with conversation.  Abdomen: No distension per patient report Extremities: +BLE edema. Neuro: Alert & oriented x 3.   Recent Labs: 10/26/2018: Magnesium 2.7 11/14/2018: B Natriuretic Peptide 206.8 11/27/2018: ALT 15; Hemoglobin 11.1; Platelets  294 12/12/2018: BUN 76; Creatinine, Ser 2.38; Potassium 3.8; Sodium 139  Personally reviewed   Wt Readings from Last 3 Encounters:  12/12/18 115.7 kg (255 lb 2 oz)  11/28/18 113.2 kg (249 lb 9.6 oz)  11/14/18 114.8 kg (253 lb)  ASSESSMENT AND PLAN:  1. Chronic combined(primary diastolic)HF - LHC 3507 with normal coronaries.Echo 10/2018: EF 45-50%, diffuse HK, LA mildly dilated. RV normal  - RHC 10/27/18 with only mildly elevated filling pressures, mild pulmonary HTN, and good CO.  - Chronic NYHA III-IIIb symptoms. Confounded by deconditioning and immobility.  - Volume sounds elevated in setting of eating out.  - Continue torsemide 80 mg q am and 40 mg q pm. Take extra 40 mg torsemide for 2 more days.  - Continue coreg 6.25 mg twice a day.  - No spiro, ACEi/ARB/ARNI with CKD IV - Did not tolerate Bidil with hypotension.  - Refer to HF CSW for meals.   2. CKD - baseline CKD IV  - Creatinine baseline 2-2.4 - Followed by [facility]. Sees them again in May or June.    - Creatinine stable 2.38, K 3.8 on labs 12/12/18  3.. Tobacco use - Still smoking 1/4 ppd. Discussed smoking cessation.   4. Obesity - Body mass index is 43.79 kg/m.  - Discussed portion control. No change.   5. ?OSA - Needs sleep study but she does not want to pursue.  She refuses.   6.Hx of TIA - Continue plavix and simvastatin.No change.    7. HTN - Mildly elevated today in setting of volume overload.    COVID screen The patient does not have any symptoms that suggest any further testing/ screening at this time.  Social distancing reinforced today.   Patient Risk: After full review of this patients clinical status, I feel that they are at moderate risk for cardiac decompensation at this time.  Orders/Follow up: Take additional 40 mg torsemide today and tomorrow. Follow up in 2-3 months. Refer to HF CSW for meals.   Today, I have spent 15 minutes with the patient with telehealth  technology discussing the above issues.    Signed, Georgiana Shore, NP  02/06/2019 10:36 AM   [facility] [address] [facility] [place] [address] [phone] (office) [phone] (fax)

## 2019-02-06 ENCOUNTER — Other Ambulatory Visit: Payer: Self-pay

## 2019-02-06 ENCOUNTER — Telehealth (HOSPITAL_COMMUNITY): Payer: Self-pay | Admitting: Licensed Clinical Social Worker

## 2019-02-06 ENCOUNTER — Ambulatory Visit (HOSPITAL_COMMUNITY)
Admission: RE | Admit: 2019-02-06 | Discharge: 2019-02-06 | Disposition: A | Discharge status: Home / Self Care | Payer: Medicare Other | Source: Ambulatory Visit | Attending: Internal Medicine | Admitting: Internal Medicine

## 2019-02-06 DIAGNOSIS — E669 Obesity, unspecified: Secondary | ICD-10-CM

## 2019-02-06 DIAGNOSIS — I5042 Chronic combined systolic (congestive) and diastolic (congestive) heart failure: Secondary | ICD-10-CM

## 2019-02-06 DIAGNOSIS — Z720 Tobacco use: Secondary | ICD-10-CM

## 2019-02-06 DIAGNOSIS — N184 Chronic kidney disease, stage 4 (severe): Secondary | ICD-10-CM

## 2019-02-06 DIAGNOSIS — I10 Essential (primary) hypertension: Secondary | ICD-10-CM

## 2019-02-06 NOTE — Patient Instructions (Signed)
INCREASE Torsemide to 80 mg twice a day for 2 days, then resume normal dose of 80 mg in the AM and 40 mg in the PM  You have been referred to [facility] to further discuss your needs - they will be in contact to discuss further  Your physician recommends that you schedule a follow-up appointment in: 2-3 months 04/08/19 @ 1030A   Do the following things EVERYDAY: 1) Weigh yourself in the morning before breakfast. Write it down and keep it in a log. 2) Take your medicines as prescribed 3) Eat low salt foods-Limit salt (sodium) to 2000 mg per day.  4) Stay as active as you can everyday 5) Limit all fluids for the day to less than 2 liters

## 2019-02-06 NOTE — Telephone Encounter (Signed)
CSW received clinic referral for concerns with food insecurity.   CSW spoke with pt who states she is unable to cook for herself at this time due to physical limitations and this has lead to her ordering lots of food.  Pt unable to afford continuing to order food from restaurants and is concerned about the food not being heart healthy  CSW referred patient to the [facility] Food Delivery program to provide pt with 7 prepared heart healthy meals a week.  Pt informed that food will be delivered to the door with no face to face contact- pt expressed understanding.  CSW will continue to follow and assist as needed  Jorge Ny, [facility] Desk#: [phone] Cell#: [phone]

## 2019-02-06 NOTE — Addendum Note (Signed)
Encounter addended by: Kerry Dory, CMA on: 02/06/2019 11:08 AM  Actions taken: Clinical Note Signed

## 2019-02-16 ENCOUNTER — Telehealth (HOSPITAL_COMMUNITY): Payer: Self-pay | Admitting: Licensed Clinical Social Worker

## 2019-02-16 NOTE — Telephone Encounter (Signed)
CSW contacted patient to follow up on weekly food delivery package. Patient informed of delivery time and no face to face contact during delivery. Patient verbalizes understanding and grateful for the assistance.  CSW continues to follow for supportive needs. Jackie Demiana Crumbley, LCSW, CCSW-MCS [phone]  

## 2019-02-23 ENCOUNTER — Telehealth (HOSPITAL_COMMUNITY): Payer: Self-pay | Admitting: Licensed Clinical Social Worker

## 2019-02-23 NOTE — Telephone Encounter (Signed)
CSW contacted patient to follow up on weekly food delivery package. Patient informed of delivery time and no face to face contact during delivery. Patient verbalizes understanding and grateful for the assistance.  CSW continues to follow for supportive needs. Jackie Elishia Kaczorowski, LCSW, CCSW-MCS [phone]  

## 2019-02-26 ENCOUNTER — Telehealth (HOSPITAL_COMMUNITY): Payer: Self-pay | Admitting: Licensed Clinical Social Worker

## 2019-02-26 NOTE — Telephone Encounter (Signed)
CSW contacted patient to follow up on weekly food delivery package. Patient informed of no face to face contact during delivery. Patient verbalizes understanding and grateful for the assistance.  CSW continues to follow for supportive needs. Raquel Sarna, [place], [place]

## 2019-03-09 ENCOUNTER — Telehealth (HOSPITAL_COMMUNITY): Payer: Self-pay | Admitting: Licensed Clinical Social Worker

## 2019-03-09 NOTE — Telephone Encounter (Signed)
CSW contacted patient to follow up on weekly food delivery package. Patient informed of no face to face contact during delivery. CSW discussed transition option for food delivery as the Covid 19 Food relief program will be ending on March 20, 2019. Patient verbalizes understanding and grateful for the assistance.  CSW continues to follow for supportive needs. Jackie Kora Groom, LCSW, CCSW-MCS [phone] 

## 2019-03-16 ENCOUNTER — Other Ambulatory Visit: Payer: Self-pay

## 2019-03-16 ENCOUNTER — Emergency Department (HOSPITAL_COMMUNITY): Payer: Medicare Other

## 2019-03-16 ENCOUNTER — Inpatient Hospital Stay (HOSPITAL_COMMUNITY)
Admission: EM | Admit: 2019-03-16 | Discharge: 2019-03-21 | DRG: 286 | Disposition: A | Discharge status: Home / Self Care | Payer: Medicare Other | Attending: Internal Medicine | Admitting: Internal Medicine

## 2019-03-16 ENCOUNTER — Telehealth (HOSPITAL_COMMUNITY): Payer: Self-pay | Admitting: Licensed Clinical Social Worker

## 2019-03-16 DIAGNOSIS — Z6841 Body Mass Index (BMI) 40.0 and over, adult: Secondary | ICD-10-CM

## 2019-03-16 DIAGNOSIS — Z8673 Personal history of transient ischemic attack (TIA), and cerebral infarction without residual deficits: Secondary | ICD-10-CM

## 2019-03-16 DIAGNOSIS — N184 Chronic kidney disease, stage 4 (severe): Secondary | ICD-10-CM | POA: Diagnosis present

## 2019-03-16 DIAGNOSIS — K219 Gastro-esophageal reflux disease without esophagitis: Secondary | ICD-10-CM | POA: Diagnosis present

## 2019-03-16 DIAGNOSIS — I130 Hypertensive heart and chronic kidney disease with heart failure and stage 1 through stage 4 chronic kidney disease, or unspecified chronic kidney disease: Secondary | ICD-10-CM | POA: Diagnosis present

## 2019-03-16 DIAGNOSIS — E890 Postprocedural hypothyroidism: Secondary | ICD-10-CM | POA: Diagnosis present

## 2019-03-16 DIAGNOSIS — I248 Other forms of acute ischemic heart disease: Secondary | ICD-10-CM | POA: Diagnosis present

## 2019-03-16 DIAGNOSIS — I5023 Acute on chronic systolic (congestive) heart failure: Secondary | ICD-10-CM | POA: Diagnosis not present

## 2019-03-16 DIAGNOSIS — N179 Acute kidney failure, unspecified: Secondary | ICD-10-CM | POA: Diagnosis not present

## 2019-03-16 DIAGNOSIS — I890 Lymphedema, not elsewhere classified: Secondary | ICD-10-CM | POA: Diagnosis present

## 2019-03-16 DIAGNOSIS — M109 Gout, unspecified: Secondary | ICD-10-CM | POA: Diagnosis present

## 2019-03-16 DIAGNOSIS — J449 Chronic obstructive pulmonary disease, unspecified: Secondary | ICD-10-CM | POA: Diagnosis present

## 2019-03-16 DIAGNOSIS — Z888 Allergy status to other drugs, medicaments and biological substances status: Secondary | ICD-10-CM

## 2019-03-16 DIAGNOSIS — Z885 Allergy status to narcotic agent status: Secondary | ICD-10-CM

## 2019-03-16 DIAGNOSIS — I5033 Acute on chronic diastolic (congestive) heart failure: Secondary | ICD-10-CM | POA: Diagnosis not present

## 2019-03-16 DIAGNOSIS — E039 Hypothyroidism, unspecified: Secondary | ICD-10-CM | POA: Diagnosis present

## 2019-03-16 DIAGNOSIS — I10 Essential (primary) hypertension: Secondary | ICD-10-CM | POA: Diagnosis present

## 2019-03-16 DIAGNOSIS — Z720 Tobacco use: Secondary | ICD-10-CM | POA: Diagnosis not present

## 2019-03-16 DIAGNOSIS — I509 Heart failure, unspecified: Secondary | ICD-10-CM

## 2019-03-16 DIAGNOSIS — F17210 Nicotine dependence, cigarettes, uncomplicated: Secondary | ICD-10-CM | POA: Diagnosis present

## 2019-03-16 DIAGNOSIS — E1165 Type 2 diabetes mellitus with hyperglycemia: Secondary | ICD-10-CM | POA: Diagnosis present

## 2019-03-16 DIAGNOSIS — Z1159 Encounter for screening for other viral diseases: Secondary | ICD-10-CM | POA: Diagnosis not present

## 2019-03-16 DIAGNOSIS — Z79899 Other long term (current) drug therapy: Secondary | ICD-10-CM

## 2019-03-16 DIAGNOSIS — D72829 Elevated white blood cell count, unspecified: Secondary | ICD-10-CM | POA: Diagnosis present

## 2019-03-16 DIAGNOSIS — E1151 Type 2 diabetes mellitus with diabetic peripheral angiopathy without gangrene: Secondary | ICD-10-CM | POA: Diagnosis present

## 2019-03-16 DIAGNOSIS — E1122 Type 2 diabetes mellitus with diabetic chronic kidney disease: Secondary | ICD-10-CM | POA: Diagnosis present

## 2019-03-16 DIAGNOSIS — G894 Chronic pain syndrome: Secondary | ICD-10-CM | POA: Diagnosis present

## 2019-03-16 DIAGNOSIS — I5043 Acute on chronic combined systolic (congestive) and diastolic (congestive) heart failure: Secondary | ICD-10-CM

## 2019-03-16 DIAGNOSIS — G4733 Obstructive sleep apnea (adult) (pediatric): Secondary | ICD-10-CM | POA: Diagnosis present

## 2019-03-16 DIAGNOSIS — Z794 Long term (current) use of insulin: Secondary | ICD-10-CM

## 2019-03-16 DIAGNOSIS — Z9111 Patient's noncompliance with dietary regimen: Secondary | ICD-10-CM

## 2019-03-16 DIAGNOSIS — I5021 Acute systolic (congestive) heart failure: Secondary | ICD-10-CM | POA: Diagnosis not present

## 2019-03-16 DIAGNOSIS — Z96641 Presence of right artificial hip joint: Secondary | ICD-10-CM | POA: Diagnosis present

## 2019-03-16 DIAGNOSIS — K224 Dyskinesia of esophagus: Secondary | ICD-10-CM | POA: Diagnosis present

## 2019-03-16 DIAGNOSIS — Z833 Family history of diabetes mellitus: Secondary | ICD-10-CM

## 2019-03-16 DIAGNOSIS — Z8614 Personal history of Methicillin resistant Staphylococcus aureus infection: Secondary | ICD-10-CM

## 2019-03-16 DIAGNOSIS — R1310 Dysphagia, unspecified: Secondary | ICD-10-CM

## 2019-03-16 DIAGNOSIS — E785 Hyperlipidemia, unspecified: Secondary | ICD-10-CM | POA: Diagnosis present

## 2019-03-16 DIAGNOSIS — Z79890 Hormone replacement therapy (postmenopausal): Secondary | ICD-10-CM

## 2019-03-16 DIAGNOSIS — M1990 Unspecified osteoarthritis, unspecified site: Secondary | ICD-10-CM | POA: Diagnosis present

## 2019-03-16 DIAGNOSIS — E119 Type 2 diabetes mellitus without complications: Secondary | ICD-10-CM

## 2019-03-16 DIAGNOSIS — Z7902 Long term (current) use of antithrombotics/antiplatelets: Secondary | ICD-10-CM

## 2019-03-16 DIAGNOSIS — R0602 Shortness of breath: Secondary | ICD-10-CM | POA: Diagnosis present

## 2019-03-16 DIAGNOSIS — R079 Chest pain, unspecified: Secondary | ICD-10-CM | POA: Diagnosis not present

## 2019-03-16 DIAGNOSIS — Z8249 Family history of ischemic heart disease and other diseases of the circulatory system: Secondary | ICD-10-CM

## 2019-03-16 DIAGNOSIS — Z90710 Acquired absence of both cervix and uterus: Secondary | ICD-10-CM

## 2019-03-16 DIAGNOSIS — R7989 Other specified abnormal findings of blood chemistry: Secondary | ICD-10-CM | POA: Diagnosis not present

## 2019-03-16 HISTORY — DX: Dyspnea, unspecified: R06.00

## 2019-03-16 HISTORY — DX: Heart failure, unspecified: I50.9

## 2019-03-16 LAB — CBC WITH DIFFERENTIAL/PLATELET
Abs Immature Granulocytes: 0.05 10*3/uL (ref 0.00–0.07)
Basophils Absolute: 0 10*3/uL (ref 0.0–0.1)
Basophils Relative: 0 %
Eosinophils Absolute: 0.1 10*3/uL (ref 0.0–0.5)
Eosinophils Relative: 1 %
HCT: 38.4 % (ref 36.0–46.0)
Hemoglobin: 12.1 g/dL (ref 12.0–15.0)
Immature Granulocytes: 0 %
Lymphocytes Relative: 16 %
Lymphs Abs: 1.9 10*3/uL (ref 0.7–4.0)
MCH: 26.8 pg (ref 26.0–34.0)
MCHC: 31.5 g/dL (ref 30.0–36.0)
MCV: 85 fL (ref 80.0–100.0)
Monocytes Absolute: 0.7 10*3/uL (ref 0.1–1.0)
Monocytes Relative: 6 %
Neutro Abs: 9 10*3/uL — ABNORMAL HIGH (ref 1.7–7.7)
Neutrophils Relative %: 77 %
Platelets: 338 10*3/uL (ref 150–400)
RBC: 4.52 MIL/uL (ref 3.87–5.11)
RDW: 15.5 % (ref 11.5–15.5)
WBC: 11.7 10*3/uL — ABNORMAL HIGH (ref 4.0–10.5)
nRBC: 0 % (ref 0.0–0.2)

## 2019-03-16 LAB — BASIC METABOLIC PANEL
Anion gap: 14 (ref 5–15)
BUN: 75 mg/dL — ABNORMAL HIGH (ref 8–23)
CO2: 27 mmol/L (ref 22–32)
Calcium: 10.4 mg/dL — ABNORMAL HIGH (ref 8.9–10.3)
Chloride: 100 mmol/L (ref 98–111)
Creatinine, Ser: 2.05 mg/dL — ABNORMAL HIGH (ref 0.44–1.00)
GFR calc Af Amer: 28 mL/min — ABNORMAL LOW (ref >60)
GFR calc non Af Amer: 24 mL/min — ABNORMAL LOW (ref >60)
Glucose, Bld: 125 mg/dL — ABNORMAL HIGH (ref 70–99)
Potassium: 3.9 mmol/L (ref 3.5–5.1)
Sodium: 141 mmol/L (ref 135–145)

## 2019-03-16 LAB — SARS CORONAVIRUS 2 BY RT PCR (HOSPITAL ORDER, PERFORMED IN ~~LOC~~ HOSPITAL LAB): SARS Coronavirus 2: NEGATIVE

## 2019-03-16 LAB — SEDIMENTATION RATE: Sed Rate: 15 mm/hr (ref 0–22)

## 2019-03-16 LAB — TROPONIN I
Troponin I: 0.08 ng/mL (ref <0.03)
Troponin I: 0.08 ng/mL (ref <0.03)
Troponin I: 0.09 ng/mL (ref <0.03)

## 2019-03-16 LAB — GLUCOSE, CAPILLARY: Glucose-Capillary: 212 mg/dL — ABNORMAL HIGH (ref 70–99)

## 2019-03-16 LAB — TSH: TSH: 0.599 u[IU]/mL (ref 0.350–4.500)

## 2019-03-16 LAB — CBG MONITORING, ED: Glucose-Capillary: 120 mg/dL — ABNORMAL HIGH (ref 70–99)

## 2019-03-16 LAB — BRAIN NATRIURETIC PEPTIDE: B Natriuretic Peptide: 196.4 pg/mL — ABNORMAL HIGH (ref 0.0–100.0)

## 2019-03-16 MED ORDER — HYDROXYZINE HCL 25 MG PO TABS: 25.0000 mg | ORAL_TABLET | Freq: Two times a day (BID) | ORAL | Status: DC | PRN

## 2019-03-16 MED ORDER — MIRABEGRON ER 50 MG PO TB24
50.0000 mg | ORAL_TABLET | Freq: Every day | ORAL | Status: DC
  Administered 2019-03-17 – 2019-03-21 (×5): 50 mg via ORAL
  Filled 2019-03-16 (×6): qty 1

## 2019-03-16 MED ORDER — CARVEDILOL 6.25 MG PO TABS
6.2500 mg | ORAL_TABLET | Freq: Two times a day (BID) | ORAL | Status: DC
  Administered 2019-03-17 – 2019-03-21 (×9): 6.25 mg via ORAL
  Filled 2019-03-16 (×9): qty 1

## 2019-03-16 MED ORDER — POLYVINYL ALCOHOL 1.4 % OP SOLN
2.0000 [drp] | Freq: Every day | OPHTHALMIC | Status: DC | PRN
  Filled 2019-03-16: qty 15

## 2019-03-16 MED ORDER — FENOFIBRATE 160 MG PO TABS: 160.0000 mg | ORAL_TABLET | Freq: Every day | ORAL | Status: DC

## 2019-03-16 MED ORDER — FUROSEMIDE 10 MG/ML IJ SOLN
80.0000 mg | Freq: Once | INTRAMUSCULAR | Status: AC
  Administered 2019-03-16: 80 mg via INTRAVENOUS
  Filled 2019-03-16: qty 8

## 2019-03-16 MED ORDER — SIMVASTATIN 20 MG PO TABS
20.0000 mg | ORAL_TABLET | Freq: Every day | ORAL | Status: DC
  Administered 2019-03-16 – 2019-03-20 (×5): 20 mg via ORAL
  Filled 2019-03-16 (×5): qty 1

## 2019-03-16 MED ORDER — CLOPIDOGREL BISULFATE 75 MG PO TABS
75.0000 mg | ORAL_TABLET | Freq: Every day | ORAL | Status: DC
  Administered 2019-03-17 – 2019-03-21 (×5): 75 mg via ORAL
  Filled 2019-03-16 (×5): qty 1

## 2019-03-16 MED ORDER — MOMETASONE FURO-FORMOTEROL FUM 200-5 MCG/ACT IN AERO
2.0000 | INHALATION_SPRAY | Freq: Two times a day (BID) | RESPIRATORY_TRACT | Status: DC
  Administered 2019-03-16 – 2019-03-21 (×10): 2 via RESPIRATORY_TRACT
  Filled 2019-03-16 (×2): qty 8.8

## 2019-03-16 MED ORDER — ASPIRIN 81 MG PO CHEW
324.0000 mg | CHEWABLE_TABLET | Freq: Once | ORAL | Status: AC
  Administered 2019-03-16: 324 mg via ORAL
  Filled 2019-03-16: qty 4

## 2019-03-16 MED ORDER — LEVOTHYROXINE SODIUM 75 MCG PO TABS
150.0000 ug | ORAL_TABLET | Freq: Every day | ORAL | Status: DC
  Administered 2019-03-17 – 2019-03-21 (×5): 150 ug via ORAL
  Filled 2019-03-16 (×5): qty 2

## 2019-03-16 MED ORDER — INSULIN ASPART 100 UNIT/ML ~~LOC~~ SOLN
0.0000 [IU] | Freq: Three times a day (TID) | SUBCUTANEOUS | Status: DC
  Administered 2019-03-17: 5 [IU] via SUBCUTANEOUS
  Administered 2019-03-17 – 2019-03-18 (×4): 3 [IU] via SUBCUTANEOUS
  Administered 2019-03-18 – 2019-03-19 (×2): 2 [IU] via SUBCUTANEOUS
  Administered 2019-03-19: 5 [IU] via SUBCUTANEOUS
  Administered 2019-03-20 (×2): 3 [IU] via SUBCUTANEOUS
  Administered 2019-03-20 – 2019-03-21 (×2): 5 [IU] via SUBCUTANEOUS

## 2019-03-16 MED ORDER — FUROSEMIDE 10 MG/ML IJ SOLN: 8.0000 mg/h | INTRAVENOUS | Status: DC

## 2019-03-16 MED ORDER — SODIUM CHLORIDE 0.9% FLUSH
3.0000 mL | Freq: Two times a day (BID) | INTRAVENOUS | Status: DC
  Administered 2019-03-16 – 2019-03-20 (×8): 3 mL via INTRAVENOUS

## 2019-03-16 MED ORDER — ACETAMINOPHEN 325 MG PO TABS
650.0000 mg | ORAL_TABLET | ORAL | Status: DC | PRN
  Administered 2019-03-17 (×2): 650 mg via ORAL
  Filled 2019-03-16 (×2): qty 2

## 2019-03-16 MED ORDER — LORATADINE 10 MG PO TABS: 10.0000 mg | ORAL_TABLET | Freq: Every day | ORAL | Status: DC | PRN

## 2019-03-16 MED ORDER — ALBUTEROL SULFATE (2.5 MG/3ML) 0.083% IN NEBU
2.5000 mg | INHALATION_SOLUTION | Freq: Four times a day (QID) | RESPIRATORY_TRACT | Status: DC | PRN
  Administered 2019-03-19 – 2019-03-20 (×2): 2.5 mg via RESPIRATORY_TRACT
  Filled 2019-03-16 (×2): qty 3

## 2019-03-16 MED ORDER — FUROSEMIDE 10 MG/ML IJ SOLN
15.0000 mg/h | INTRAVENOUS | Status: DC
  Administered 2019-03-17 (×2): 8 mg/h via INTRAVENOUS
  Administered 2019-03-18 – 2019-03-19 (×2): 15 mg/h via INTRAVENOUS
  Filled 2019-03-16 (×4): qty 25
  Filled 2019-03-16: qty 21

## 2019-03-16 MED ORDER — HEPARIN SODIUM (PORCINE) 5000 UNIT/ML IJ SOLN
5000.0000 [IU] | Freq: Three times a day (TID) | INTRAMUSCULAR | Status: DC
  Administered 2019-03-16 – 2019-03-20 (×12): 5000 [IU] via SUBCUTANEOUS
  Filled 2019-03-16 (×13): qty 1

## 2019-03-16 MED ORDER — SODIUM CHLORIDE 0.9 % IV SOLN: 250.0000 mL | INTRAVENOUS | Status: DC | PRN

## 2019-03-16 MED ORDER — SODIUM CHLORIDE 0.9% FLUSH: 3.0000 mL | INTRAVENOUS | Status: DC | PRN

## 2019-03-16 MED ORDER — ONDANSETRON HCL 4 MG/2ML IJ SOLN: 4.0000 mg | Freq: Four times a day (QID) | INTRAMUSCULAR | Status: DC | PRN

## 2019-03-16 NOTE — ED Triage Notes (Signed)
Pt arrived via [facility] from home c/o sob with exertion. Pt denies sob at rest, but has hx of CHF. Denies cp at time of triage. Pt states she has been sob x1 week but has gotten worse today. Alert and oriented x4. EMS v/s Sp0298% ra, rr24, cbg 186, 150/68. EMS reported no orthostatic changes.

## 2019-03-16 NOTE — Plan of Care (Signed)
  Problem: Education: Goal: Knowledge of General Education information will improve Description Including pain rating scale, medication(s)/side effects and non-pharmacologic comfort measures Outcome: Progressing   Problem: Clinical Measurements: Goal: Will remain free from infection Outcome: Progressing Goal: Diagnostic test results will improve Outcome: Progressing   Problem: Activity: Goal: Risk for activity intolerance will decrease Outcome: Progressing   Problem: Safety: Goal: Ability to remain free from injury will improve Outcome: Progressing

## 2019-03-16 NOTE — ED Notes (Addendum)
Called 3E pt to go upstairs

## 2019-03-16 NOTE — ED Notes (Signed)
Patient transported to X-ray 

## 2019-03-16 NOTE — H&P (Addendum)
History and Physical    [name] [name] HDQ:[id number] DOB: 1951-02-08 DOA: 03/16/2019  Referring MD/NP/PA: Sherwood Gambler, MD PCP: [name], MD  Patient coming from: Home via EMS  Chief Complaint: Shortness of breath  I have personally briefly reviewed patient's old medical records in [place]   HPI: [name] [name] is a 68 y.o. female with medical history significant of systolic CHF, DM type II, CKD stage III, obesity, OSA, and hypothyroidism; who presents with complaints of progressively worsening shortness of breath over the last 1 week.  She reports and her weight is up approximately 9 pounds and she said lower extremity swelling and difficulty laying flat at night due to her symptoms.  Patient normally is on 80 mg of torsemide in the morning and 40 mg of torsemide at night which she reports taking as advised.  As she felt that she was retaining fluid she tried taking 80 mg twice daily for 3 days without any improvement in symptoms.  Other associated symptoms include chest pressure, wheezing, mildly productive cough, and dizziness/lightheadedness.  Patient reports feeling dizziness/lightheaded feeling occurs whenever she lays down or sits up or moves her head too quickly for the last week.  She has not passed out or lost consciousness.  Patient reports that she stopped taking the calcium supplements, but still takes a multivitamin.  Review of records shows patient have been being on Lasix drip during previous admissions when treated for heart failure exacerbations.  ED Course: Upon admission to the emergency department patient was noted to be afebrile, respirations 17-25, blood pressures 146/94-151/81, and O2 saturation maintained on room air.  Labs revealed WBC 11.7, BUN 75, creatinine 2.05, calcium 10.4, BMP 196.4, and troponin 0.08.  Showing mild pulmonary edema.  Patient was given full dose aspirin and 80 mg of Lasix IV.  TRH called to admit.  Review of Systems   Constitutional: Positive for chills. Negative for diaphoresis and fever.  HENT: Negative for ear discharge and hearing loss.   Eyes: Negative for double vision and photophobia.  Respiratory: Positive for cough, sputum production and shortness of breath.   Cardiovascular: Positive for chest pain, orthopnea and leg swelling.  Gastrointestinal: Negative for nausea and vomiting.  Genitourinary: Negative for dysuria and hematuria.  Musculoskeletal: Negative for falls.  Skin: Negative for itching.  Neurological: Positive for dizziness. Negative for focal weakness and loss of consciousness.  Endo/Heme/Allergies: Negative for polydipsia.  Psychiatric/Behavioral: Negative for memory loss and substance abuse.    Past Medical History:  Diagnosis Date  . Anemia   . Arthritis   . Asthma   . Bronchitis   . Chronic kidney disease   . Chronic kidney disease   . Chronic pain syndrome   . Cramping of feet   . Diabetes mellitus   . GERD (gastroesophageal reflux disease)   . Gout   . Headache   . History of hip replacement, total   . History of transfusion   . Hx MRSA infection    abscess left groin  . Hx of cardiac cath 06/2014  . Hyperlipidemia   . Hypertension   . Hypothyroid   . 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 Morbidly obese ([place])   . Peripheral vascular disease ([place])   . Positive cardiac stress test 12/2013   anterior and lateral ischemia on Myoview  . Sleep apnea    UNABLE TO TOLERATE C PAP  . Stress incontinence   . Thyroid disease   . TIA (transient ischemic  attack)     X2 NO RESIDUAL PROBLEMS    Past Surgical History:  Procedure Laterality Date  . ABDOMINAL HYSTERECTOMY    . COLON SURGERY  1995   DUE TO POLYP  . FOREARM FRACTURE SURGERY     Left arm  . HERNIA REPAIR     w/ mesh  . INCISION AND DRAINAGE ABSCESS Left 02/04/2013   Procedure: INCISION AND DRAINAGE LEFT BUTTOCK ABSCESS; INCISION AND DRAINAGE LEFT BREAST ABSCESS;  Surgeon: Harl Bowie, MD;   Location: [facility];  Service: General;  Laterality: Left;  . INCISION AND DRAINAGE ABSCESS N/A 02/12/2013   Procedure: INCISION AND DEBRIDEMENT BUTTOCK WOUND ;  Surgeon: [name]. Georgette Dover, MD;  Location: [facility];  Service: General;  Laterality: N/A;  . INCISION AND DRAINAGE ABSCESS N/A 02/14/2013   Procedure: INCISION AND DRAINAGE/DRESSING CHANGE;  Surgeon: Harl Bowie, MD;  Location: [facility];  Service: General;  Laterality: N/A;  . INCISION AND DRAINAGE ABSCESS N/A 03/21/2015   Procedure: INCISION AND DRAINAGE PUBIC ABSCESS;  Surgeon: Excell Seltzer, MD;  Location: [facility];  Service: General;  Laterality: N/A;  . INCISION AND DRAINAGE PERIRECTAL ABSCESS Left 02/10/2013   Procedure: IRRIGATION AND DEBRIDEMENT OF BUTTOCK/PERINEAL ABSCESS;  Surgeon: [name]. Georgette Dover, MD;  Location: [facility];  Service: General;  Laterality: Left;  . INCISION AND DRAINAGE PERIRECTAL ABSCESS N/A 02/16/2013   Procedure: IRRIGATION AND DEBRIDEMENT PERINEAL ABSCESS;  Surgeon: Zenovia Jarred, MD;  Location: [facility];  Service: General;  Laterality: N/A;  . IRRIGATION AND DEBRIDEMENT ABSCESS N/A 02/06/2013   Procedure: IRRIGATION AND DEBRIDEMENT BUTTOCK ABSCESS AND DRESSING CHANGE;  Surgeon: Harl Bowie, MD;  Location: [facility];  Service: General;  Laterality: N/A;  . IRRIGATION AND DEBRIDEMENT ABSCESS Left 02/08/2013   Procedure: IRRIGATION AND DEBRIDEMENT ABSCESS/DRESSING CHANGE;  Surgeon: Gwenyth Ober, MD;  Location: [facility];  Service: General;  Laterality: Left;  . JOINT REPLACEMENT  2010 / 2012   . LAPAROSCOPIC CHOLECYSTECTOMY    . LEFT HEART CATHETERIZATION WITH CORONARY ANGIOGRAM N/A 07/02/2014   Procedure: LEFT HEART CATHETERIZATION WITH CORONARY ANGIOGRAM;  Surgeon: Lorretta Harp, MD;  Location: [facility];  Service: Cardiovascular;  Laterality: N/A;  . LOWER EXTREMITY ANGIOGRAM Right 04/20/2016   Procedure: Lower Extremity Angiogram;  Surgeon: Elam Dutch, MD;  Location: [facility];  Service: Cardiovascular;   Laterality: Right;  . PERIPHERAL VASCULAR CATHETERIZATION N/A 04/20/2016   Procedure: Abdominal Aortogram;  Surgeon: Elam Dutch, MD;  Location: [facility];  Service: Cardiovascular;  Laterality: N/A;  . PERIPHERAL VASCULAR CATHETERIZATION Right 04/20/2016   Procedure: Peripheral Vascular Intervention;  Surgeon: Elam Dutch, MD;  Location: [facility];  Service: Cardiovascular;  Laterality: Right;  popiteal  . RIGHT HEART CATH N/A 10/27/2018   Procedure: RIGHT HEART CATH;  Surgeon: Larey Dresser, MD;  Location: [facility];  Service: Cardiovascular;  Laterality: N/A;  . THYROIDECTOMY    . TOTAL HIP REVISION Right 08/11/2014   Procedure: RIGHT ACETABULAR REVISION;  Surgeon: Gearlean Alf, MD;  Location: [facility];  Service: Orthopedics;  Laterality: Right;     reports that she has been smoking cigarettes. She has a 10.00 pack-year smoking history. She has never used smokeless tobacco. She reports current alcohol use of about 1.0 standard drinks of alcohol per week. She reports that she does not use drugs.  Allergies  Allergen Reactions  . Nebivolol Swelling  . Ace Inhibitors Swelling and Other (See  Comments)    Tongue swell  . Bystolic [Nebivolol Hcl] Swelling and Other (See Comments)    Chest pain   . Morphine And Related Itching    Family History  Problem Relation Age of Onset  . Diabetes Brother   . Cardiomyopathy Mother   . Heart disease Mother   . Cancer Father     Prior to Admission medications   Medication Sig Start Date End Date Taking? Authorizing Provider  albuterol (PROAIR HFA) 108 (90 Base) MCG/ACT inhaler Inhale 2 puffs into the lungs 4 (four) times daily as needed for wheezing or shortness of breath.   Yes [provider]  albuterol (PROVENTIL) (2.5 MG/3ML) 0.083% nebulizer solution Take 2.5 mg by nebulization every 6 (six) hours as needed for wheezing or shortness of breath.   Yes [provider]  allopurinol (ZYLOPRIM)  100 MG tablet Take 100 mg by mouth every morning.    Yes [provider]  B Complex Vitamins (VITAMIN B COMPLEX PO) Take 1 tablet by mouth 2 (two) times daily.    Yes [provider]  budesonide-formoterol (SYMBICORT) 160-4.5 MCG/ACT inhaler Inhale 2 puffs into the lungs 2 (two) times daily as needed (for SOB).    Yes [provider]  carvedilol (COREG) 6.25 MG tablet TAKE 1 TABLET BY MOUTH TWICE DAILY, WITH A MEAL Patient taking differently: Take 6.25 mg by mouth 2 (two) times daily with a meal.  11/17/18  Yes Bensimhon, Shaune Pascal, MD  cetirizine (ZYRTEC) 10 MG tablet Take 10 mg by mouth daily as needed for allergies.  03/14/15  Yes [provider]  clopidogrel (PLAVIX) 75 MG tablet TAKE 1 TABLET BY MOUTH EVERY DAY Patient taking differently: Take 75 mg by mouth daily.  06/18/17  Yes Waynetta Sandy, MD  Dulaglutide 1.5 MG/0.5ML SOPN Inject 1.5 mg into the skin every Monday.    Yes [provider]  fenofibrate (TRICOR) 145 MG tablet TAKE 1 TABLET (145 MG TOTAL) BY MOUTH DAILY. Patient taking differently: Take 145 mg by mouth daily.  05/11/15  Yes Lorretta Harp, MD  fluticasone (FLONASE) 50 MCG/ACT nasal spray Place 2 sprays into the nose as needed for allergies.    Yes [provider]  hydrOXYzine (ATARAX/VISTARIL) 25 MG tablet Take 25 mg by mouth 2 (two) times daily as needed for itching.   Yes [provider]  insulin lispro protamine-lispro (HUMALOG 75/25 MIX) (75-25) 100 UNIT/ML SUSP injection Inject 35-40 Units into the skin See admin instructions. 40units in the morning and 35units in the evening   Yes [provider]  levothyroxine (SYNTHROID, LEVOTHROID) 150 MCG tablet Take 150 mcg by mouth daily before breakfast.   Yes [provider]  mirabegron ER (MYRBETRIQ) 50 MG TB24 tablet Take 50 mg by mouth daily.   Yes [provider]  Multiple Vitamin (MULTIVITAMIN WITH MINERALS) TABS tablet Take 1  tablet by mouth daily.   Yes [provider]  oxyCODONE-acetaminophen (PERCOCET) 10-325 MG tablet Take 1 tablet by mouth every 4 (four) hours as needed for pain.   Yes [provider]  polyvinyl alcohol (LIQUIFILM TEARS) 1.4 % ophthalmic solution Place 2 drops into both eyes daily as needed for dry eyes.   Yes [provider]  simvastatin (ZOCOR) 20 MG tablet Take 20 mg by mouth at bedtime. 04/08/17  Yes [provider]  tiZANidine (ZANAFLEX) 4 MG capsule Take 4 mg by mouth 2 (two) times daily.    Yes [provider]  torsemide (  DEMADEX) 20 MG tablet Take 4 tablets (80 mg total) by mouth every morning AND 2 tablets (40 mg total) every evening. 10/31/18  Yes Mariel Aloe, MD    Physical Exam:  Constitutional: Obese female in no acute distress at this time Vitals:   03/16/19 1215 03/16/19 1215 03/16/19 1230  BP: (!) 146/94  (!) 151/81  Pulse: 80    Resp: (!) 25  17  Temp:  98.7 F (37.1 C)   TempSrc:  Oral   SpO2: 95%     Eyes: PERRL, lids and conjunctivae normal ENMT: Mucous membranes are moist. Posterior pharynx clear of any exudate or lesions.  Neck: normal, supple, no masses, no thyromegaly  Respiratory: Mildly tachypneic with positive crackles appreciated.  No significant wheezes.  Patient on room air maintaining O2 saturations. Cardiovascular: Regular rate and rhythm, no murmurs / rubs / gallops.  1+ pitting bilateral lower extremity edema. 2+ pedal pulses. No carotid bruits.  Abdomen: no tenderness, no masses palpated. No hepatosplenomegaly. Bowel sounds positive.  Musculoskeletal: no clubbing / cyanosis. No joint deformity upper and lower extremities. Good ROM, no contractures. Normal muscle tone.  Skin: no rashes, lesions, ulcers. No induration Neurologic: CN 2-12 grossly intact. Sensation intact, DTR normal. Strength 5/5 in all 4.  Psychiatric: Normal judgment and insight. Alert and oriented x 3. Normal mood.     Labs on  Admission: I have personally reviewed following labs and imaging studies  CBC: Recent Labs  Lab 03/16/19 1341  WBC 11.7*  NEUTROABS 9.0*  HGB 12.1  HCT 38.4  MCV 85.0  PLT 485   Basic Metabolic Panel: Recent Labs  Lab 03/16/19 1341  NA 141  K 3.9  CL 100  CO2 27  GLUCOSE 125*  BUN 75*  CREATININE 2.05*  CALCIUM 10.4*   GFR: CrCl cannot be calculated (Unknown ideal weight.). Liver Function Tests: No results for input(s): AST, ALT, ALKPHOS, BILITOT, PROT, ALBUMIN in the last 168 hours. No results for input(s): LIPASE, AMYLASE in the last 168 hours. No results for input(s): AMMONIA in the last 168 hours. Coagulation Profile: No results for input(s): INR, PROTIME in the last 168 hours. Cardiac Enzymes: Recent Labs  Lab 03/16/19 1341  TROPONINI 0.08*   BNP (last 3 results) No results for input(s): PROBNP in the last 8760 hours. HbA1C: No results for input(s): HGBA1C in the last 72 hours. CBG: No results for input(s): GLUCAP in the last 168 hours. Lipid Profile: No results for input(s): CHOL, HDL, LDLCALC, TRIG, CHOLHDL, LDLDIRECT in the last 72 hours. Thyroid Function Tests: No results for input(s): TSH, T4TOTAL, FREET4, T3FREE, THYROIDAB in the last 72 hours. Anemia Panel: No results for input(s): VITAMINB12, FOLATE, FERRITIN, TIBC, IRON, RETICCTPCT in the last 72 hours. Urine analysis:    Component Value Date/Time   COLORURINE YELLOW 11/27/2018 1523   APPEARANCEUR CLEAR 11/27/2018 1523   LABSPEC 1.008 11/27/2018 1523   PHURINE 5.0 11/27/2018 1523   GLUCOSEU NEGATIVE 11/27/2018 1523   HGBUR NEGATIVE 11/27/2018 1523   BILIRUBINUR NEGATIVE 11/27/2018 1523   KETONESUR NEGATIVE 11/27/2018 1523   PROTEINUR NEGATIVE 11/27/2018 1523   UROBILINOGEN 0.2 08/04/2014 1409   NITRITE NEGATIVE 11/27/2018 1523   LEUKOCYTESUR NEGATIVE 11/27/2018 1523   Sepsis Labs: No results found for this or any previous visit (from the past 240 hour(s)).   Radiological Exams on  Admission: Dg Chest 2 View  Result Date: 03/16/2019 CLINICAL DATA:  Shortness of breath with exertion. Leg swelling. History of CHF. EXAM: CHEST - 2  VIEW COMPARISON:  CT chest dated January 23, 2019. Chest x-ray dated November 27, 2018. FINDINGS: Stable cardiomegaly. New pulmonary vascular congestion and mild interstitial thickening. No focal consolidation, pleural effusion, or pneumothorax. No acute osseous abnormality. IMPRESSION: 1. New mild interstitial pulmonary edema. Electronically Signed   By: Titus Dubin M.D.   On: 03/16/2019 13:05   Ct Head Wo Contrast  Result Date: 03/16/2019 CLINICAL DATA:  Headache. EXAM: CT HEAD WITHOUT CONTRAST TECHNIQUE: Contiguous axial images were obtained from the base of the skull through the vertex without intravenous contrast. COMPARISON:  None. FINDINGS: Brain: Mild chronic ischemic white matter disease is noted. No mass effect or midline shift is noted. Ventricular size is within normal limits. There is no evidence of mass lesion, hemorrhage or acute infarction. Vascular: No hyperdense vessel or unexpected calcification. Skull: Normal. Negative for fracture or focal lesion. Sinuses/Orbits: No acute finding. Other: None. IMPRESSION: Mild chronic ischemic white matter disease. No acute intracranial abnormality seen. Electronically Signed   By: Marijo Conception M.D.   On: 03/16/2019 15:04    EKG: Independently reviewed.  Sinus rhythm at 81 bpm. QTc 507  Assessment/Plan Systolic CHF exacerbation: Acute on chronic.  Patient reports lower extremity swelling and shortness of breath with 9 pound weight gain.  BNP 196.4 which appears lower than previous.  Mildly chest x-ray showing mild interstitial edema. Last echocardiogram revealed EF of 45 to 50% with diffuse hypokinesis in 10/2018.    - Admit to a progressive bed - Heart failure orders set  initiated  - Continuous pulse oximetry with nasal cannula oxygen as needed to keep O2 saturations >92% - Strict I&Os and daily  weights - Elevate lower extremities - Lasix drip at 8 mg/h - Reassess in a.m. and adjust diuresis as needed. - Check echocardiogram if warranted by cardiology - Continue beta-blocker and ACE inhibitor held due to renal insufficiency - Optimize medical management as able - Message sent for cardiology to eval in a.m.  Elevated troponin: Patient notes complaints of chest pressure.  Troponin elevated 0.08.  Suspect secondary to demand. - Continue to trend troponin  Hypercalcemia: Acute.  Calcium mildly elevated at 10.4.  Previously seen mildly elevated during last admission as well in March.  Patient reported to still be taking a multivitamin that likely has calcium in it. - Check ionized calcium in a.m. - Advised patient to discontinue multivitamin  Leukocytosis: Acute. WBC elevated at 11.7.  Patient denies having any significant infectious symptoms.  Chest x-ray showing only mild interstitial edema. - Follow-up COVID-19 screening - Continue to monitor  Diabetes mellitus type 2: Uncontrolled.  Last hemoglobin A1c 7 on 11/28/2018.  Patient normally on 7525 - Hypoglycemic protocol - Held Trulicity - CBGs q. before meals and at bedtime with moderate SSI for now - Consider restarting home regimen in a.m.  Essential hypertension: Blood pressures mildly elevated. - Continue Coreg  Dizziness: Patient reports feeling as though the room is spinning around her with changes in position.  Question BPPV. - Physical therapy to eval and treat  Chronic kidney disease stage IV: Creatinine appears slightly better than last baseline 2.3. - Continue to monitor with diuresis  COPD, without exacerbation: Patient reports having wheezing but no wheezing appreciated on physical exam. - Continue pharmacy substitution of Dulera, albuterol nebs as needed for shortness of breath/wheezing  Hypothyroidism: No recent TSH on file. - Check TSH - Continue levothyroxine   Dyslipidemia - Continue fenofibrate and  simvastatin  Tobacco use: Patient still reports smoking 5-6  cigarettes/day on average.  Declined nicotine patch. - Counseled on the need of cessation of tobacco use.    DVT prophylaxis: Heparin Code Status: Full Family Communication: No family present at bedside Disposition Plan: [place] discharge home when to 3 days Consults called: cardiology Admission status: Inpatient   Norval Morton MD [facility] Pager [phone]   If 7PM-7AM, please contact night-coverage [email or web address] Password [facility]  03/16/2019, 3:59 PM

## 2019-03-16 NOTE — ED Provider Notes (Signed)
[facility] EMERGENCY DEPARTMENT Provider Note   CSN: [id number] Arrival date & time: 03/16/19  1154    History   Chief Complaint Chief Complaint  Patient presents with  . Shortness of Breath    HPI [name] [name] is a 68 y.o. female.     HPI  68 year old female presents with shortness of breath.  Has been progressively worsening for about a week.  Originally started with bilateral leg swelling, right greater than left.  Whenever her legs swell, it is always worse than the right.  She reports compliance with her diuretic.  She has had some intermittent chest pressure that goes to her back that she states occurs while eating and feels like something is stuck.  Eventually goes through and is better.  No current chest pain.  Has had increased gas.  Has also had an on and off right-sided headache that is not currently there.  Sometimes feels like it is behind her eye.  No acute or new weakness/numbness. She's been having on and off dizziness like she's going to pass out.  Past Medical History:  Diagnosis Date  . Anemia   . Arthritis   . Asthma   . Bronchitis   . Chronic kidney disease   . Chronic kidney disease   . Chronic pain syndrome   . Cramping of feet   . Diabetes mellitus   . GERD (gastroesophageal reflux disease)   . Gout   . Headache   . History of hip replacement, total   . History of transfusion   . Hx MRSA infection    abscess left groin  . Hx of cardiac cath 06/2014  . Hyperlipidemia   . Hypertension   . Hypothyroid   . 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 Morbidly obese ([place])   . Peripheral vascular disease ([place])   . Positive cardiac stress test 12/2013   anterior and lateral ischemia on Myoview  . Sleep apnea    UNABLE TO TOLERATE C PAP  . Stress incontinence   . Thyroid disease   . TIA (transient ischemic attack)     X2 NO RESIDUAL PROBLEMS    Patient Active Problem List   Diagnosis Date Noted  . Chest pain 11/27/2018  .  Pre-syncope 11/27/2018  . Epigastric abdominal pain 11/27/2018  . Solitary pulmonary nodule 11/06/2018  . Hypertensive heart and renal disease, stage 1-4 or unspecified chronic kidney disease, with heart failure ([place]) 10/20/2018  . CHF (congestive heart failure), NYHA class III, acute on chronic, systolic ([place]) 76/16/0737  . 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 Cellulitis of abdominal wall 03/21/2015  . Acute on chronic renal failure ([place]) 03/21/2015  . DM (diabetes mellitus), type 2 with renal complications ([place]) 10/62/6948  . Abdominal wall abscess 03/21/2015  . Failed total hip arthroplasty ([place]) 08/11/2014  . Low urine output 08/11/2014  . Acute on chronic systolic CHF (congestive heart failure), NYHA class 3 ([place]) 08/11/2014  . Asthma, chronic 08/11/2014  . Hypothyroidism 08/11/2014  . OSA (obstructive sleep apnea) 08/11/2014  . Positive cardiac stress test 06/29/2014  . Hyperlipidemia 02/12/2014  . Left arm weakness 12/23/2013  . TIA (transient ischemic attack) 12/23/2013  . Left buttock abscess 10/12/2013  . Cellulitis and abscess of leg, right 04/16/2013  . Acute blood loss anemia 02/14/2013  . HTN (hypertension) 02/14/2013  . Cellulitis and abscess of buttock 02/09/2013  . Morbid obesity ([place]) 02/03/2013  . Diarrhea  02/03/2013  . Hyponatremia 02/03/2013  . DM (diabetes mellitus) ([place]) 02/02/2013  . CKD (chronic kidney disease), stage III ([place]) 02/02/2013  . Cellulitis 02/02/2013  . PAD (peripheral artery disease) ([place]) 04/26/2011  . Tobacco abuse 04/26/2011    Past Surgical History:  Procedure Laterality Date  . ABDOMINAL HYSTERECTOMY    . COLON SURGERY  1995   DUE TO POLYP  . FOREARM FRACTURE SURGERY     Left arm  . HERNIA REPAIR     w/ mesh  . INCISION AND DRAINAGE ABSCESS Left 02/04/2013   Procedure: INCISION AND DRAINAGE LEFT BUTTOCK ABSCESS; INCISION AND DRAINAGE LEFT BREAST ABSCESS;   Surgeon: Harl Bowie, MD;  Location: [facility];  Service: General;  Laterality: Left;  . INCISION AND DRAINAGE ABSCESS N/A 02/12/2013   Procedure: INCISION AND DEBRIDEMENT BUTTOCK WOUND ;  Surgeon: [name]. Georgette Dover, MD;  Location: [facility];  Service: General;  Laterality: N/A;  . INCISION AND DRAINAGE ABSCESS N/A 02/14/2013   Procedure: INCISION AND DRAINAGE/DRESSING CHANGE;  Surgeon: Harl Bowie, MD;  Location: [facility];  Service: General;  Laterality: N/A;  . INCISION AND DRAINAGE ABSCESS N/A 03/21/2015   Procedure: INCISION AND DRAINAGE PUBIC ABSCESS;  Surgeon: Excell Seltzer, MD;  Location: [facility];  Service: General;  Laterality: N/A;  . INCISION AND DRAINAGE PERIRECTAL ABSCESS Left 02/10/2013   Procedure: IRRIGATION AND DEBRIDEMENT OF BUTTOCK/PERINEAL ABSCESS;  Surgeon: [name]. Georgette Dover, MD;  Location: [facility];  Service: General;  Laterality: Left;  . INCISION AND DRAINAGE PERIRECTAL ABSCESS N/A 02/16/2013   Procedure: IRRIGATION AND DEBRIDEMENT PERINEAL ABSCESS;  Surgeon: Zenovia Jarred, MD;  Location: [facility];  Service: General;  Laterality: N/A;  . IRRIGATION AND DEBRIDEMENT ABSCESS N/A 02/06/2013   Procedure: IRRIGATION AND DEBRIDEMENT BUTTOCK ABSCESS AND DRESSING CHANGE;  Surgeon: Harl Bowie, MD;  Location: [facility];  Service: General;  Laterality: N/A;  . IRRIGATION AND DEBRIDEMENT ABSCESS Left 02/08/2013   Procedure: IRRIGATION AND DEBRIDEMENT ABSCESS/DRESSING CHANGE;  Surgeon: Gwenyth Ober, MD;  Location: [facility];  Service: General;  Laterality: Left;  . JOINT REPLACEMENT  2010 / 2012   . LAPAROSCOPIC CHOLECYSTECTOMY    . LEFT HEART CATHETERIZATION WITH CORONARY ANGIOGRAM N/A 07/02/2014   Procedure: LEFT HEART CATHETERIZATION WITH CORONARY ANGIOGRAM;  Surgeon: Lorretta Harp, MD;  Location: [facility] CATH LAB;  Service: Cardiovascular;  Laterality: N/A;  . LOWER EXTREMITY ANGIOGRAM Right 04/20/2016   Procedure: Lower Extremity Angiogram;  Surgeon: Elam Dutch, MD;  Location: [facility]  CV LAB;  Service: Cardiovascular;  Laterality: Right;  . PERIPHERAL VASCULAR CATHETERIZATION N/A 04/20/2016   Procedure: Abdominal Aortogram;  Surgeon: Elam Dutch, MD;  Location: [facility] CV LAB;  Service: Cardiovascular;  Laterality: N/A;  . PERIPHERAL VASCULAR CATHETERIZATION Right 04/20/2016   Procedure: Peripheral Vascular Intervention;  Surgeon: Elam Dutch, MD;  Location: [facility] CV LAB;  Service: Cardiovascular;  Laterality: Right;  popiteal  . RIGHT HEART CATH N/A 10/27/2018   Procedure: RIGHT HEART CATH;  Surgeon: Larey Dresser, MD;  Location: [facility] CV LAB;  Service: Cardiovascular;  Laterality: N/A;  . THYROIDECTOMY    . TOTAL HIP REVISION Right 08/11/2014   Procedure: RIGHT ACETABULAR REVISION;  Surgeon: Gearlean Alf, MD;  Location: [facility];  Service: Orthopedics;  Laterality: Right;     OB History   No obstetric history on file.      Home Medications    Prior to Admission medications   Medication Sig Start Date  End Date Taking? Authorizing Provider  albuterol (PROAIR HFA) 108 (90 Base) MCG/ACT inhaler Inhale 2 puffs into the lungs 4 (four) times daily as needed for wheezing or shortness of breath.   Yes [provider]  albuterol (PROVENTIL) (2.5 MG/3ML) 0.083% nebulizer solution Take 2.5 mg by nebulization every 6 (six) hours as needed for wheezing or shortness of breath.   Yes [provider]  allopurinol (ZYLOPRIM) 100 MG tablet Take 100 mg by mouth every morning.    Yes [provider]  B Complex Vitamins (VITAMIN B COMPLEX PO) Take 1 tablet by mouth 2 (two) times daily.    Yes [provider]  budesonide-formoterol (SYMBICORT) 160-4.5 MCG/ACT inhaler Inhale 2 puffs into the lungs 2 (two) times daily as needed (for SOB).    Yes [provider]  carvedilol (COREG) 6.25 MG tablet TAKE 1 TABLET BY MOUTH TWICE DAILY, WITH A MEAL Patient taking differently: Take 6.25 mg by mouth 2 (two) times daily with a  meal.  11/17/18  Yes Bensimhon, Shaune Pascal, MD  cetirizine (ZYRTEC) 10 MG tablet Take 10 mg by mouth daily as needed for allergies.  03/14/15  Yes [provider]  clopidogrel (PLAVIX) 75 MG tablet TAKE 1 TABLET BY MOUTH EVERY DAY Patient taking differently: Take 75 mg by mouth daily.  06/18/17  Yes Waynetta Sandy, MD  Dulaglutide 1.5 MG/0.5ML SOPN Inject 1.5 mg into the skin every Monday.    Yes [provider]  fenofibrate (TRICOR) 145 MG tablet TAKE 1 TABLET (145 MG TOTAL) BY MOUTH DAILY. Patient taking differently: Take 145 mg by mouth daily.  05/11/15  Yes Lorretta Harp, MD  fluticasone (FLONASE) 50 MCG/ACT nasal spray Place 2 sprays into the nose as needed for allergies.    Yes [provider]  hydrOXYzine (ATARAX/VISTARIL) 25 MG tablet Take 25 mg by mouth 2 (two) times daily as needed for itching.   Yes [provider]  insulin lispro protamine-lispro (HUMALOG 75/25 MIX) (75-25) 100 UNIT/ML SUSP injection Inject 35-40 Units into the skin See admin instructions. 40units in the morning and 35units in the evening   Yes [provider]  levothyroxine (SYNTHROID, LEVOTHROID) 150 MCG tablet Take 150 mcg by mouth daily before breakfast.   Yes [provider]  mirabegron ER (MYRBETRIQ) 50 MG TB24 tablet Take 50 mg by mouth daily.   Yes [provider]  Multiple Vitamin (MULTIVITAMIN WITH MINERALS) TABS tablet Take 1 tablet by mouth daily.   Yes [provider]  oxyCODONE-acetaminophen (PERCOCET) 10-325 MG tablet Take 1 tablet by mouth every 4 (four) hours as needed for pain.   Yes [provider]  polyvinyl alcohol (LIQUIFILM TEARS) 1.4 % ophthalmic solution Place 2 drops into both eyes daily as needed for dry eyes.   Yes [provider]  simvastatin (ZOCOR) 20 MG tablet Take 20 mg by mouth at bedtime. 04/08/17  Yes [provider]  tiZANidine (ZANAFLEX) 4 MG capsule Take 4 mg by mouth 2 (two) times  daily.    Yes [provider]  torsemide (DEMADEX) 20 MG tablet Take 4 tablets (80 mg total) by mouth every morning AND 2 tablets (40 mg total) every evening. 10/31/18  Yes Mariel Aloe, MD    Family History Family History  Problem Relation Age of Onset  . Diabetes Brother   . Cardiomyopathy Mother   . Heart disease Mother   . Cancer Father     Social History Social History   Tobacco  Use  . Smoking status: Current Some Day Smoker    Packs/day: 0.25    Years: 40.00    Pack years: 10.00    Types: Cigarettes  . Smokeless tobacco: Never Used  Substance Use Topics  . Alcohol use: Yes    Alcohol/week: 1.0 standard drinks    Types: 1 Standard drinks or equivalent per week    Comment: OCC  . Drug use: No    Comment: Quit four years ago.     Allergies   Nebivolol; Ace inhibitors; Bystolic [nebivolol hcl]; and Morphine and related   Review of Systems Review of Systems  Constitutional: Negative for fever.  Respiratory: Positive for shortness of breath. Negative for cough.   Cardiovascular: Positive for chest pain and leg swelling.  Gastrointestinal: Positive for nausea. Negative for abdominal pain.  Neurological: Positive for dizziness and headaches. Negative for weakness and numbness.  All other systems reviewed and are negative.    Physical Exam Updated Vital Signs BP (!) 151/81   Pulse 80   Temp 98.7 F (37.1 C) (Oral)   Resp 17   SpO2 95%   Physical Exam Vitals signs and nursing note reviewed.  Constitutional:      Appearance: She is well-developed. She is obese.  HENT:     Head: Normocephalic and atraumatic.     Right Ear: External ear normal.     Left Ear: External ear normal.     Nose: Nose normal.  Eyes:     General:        Right eye: No discharge.        Left eye: No discharge.     Extraocular Movements: Extraocular movements intact.     Pupils: Pupils are equal, round, and reactive to light.  Cardiovascular:     Rate and Rhythm:  Normal rate and regular rhythm.     Heart sounds: Normal heart sounds.  Pulmonary:     Effort: Pulmonary effort is normal.     Breath sounds: Examination of the right-lower field reveals rales. Examination of the left-lower field reveals rales. Rales present.  Abdominal:     Palpations: Abdomen is soft.     Tenderness: There is no abdominal tenderness.  Musculoskeletal:     Right lower leg: Edema present.     Left lower leg: Edema present.  Skin:    General: Skin is warm and dry.  Neurological:     Mental Status: She is alert.     Comments: CN 3-12 grossly intact. 5/5 strength RUE, LUE, LLE. 4/5 RLE, chronic per patient from prior hip problems. Grossly normal sensation. Normal finger to nose.   Psychiatric:        Mood and Affect: Mood is not anxious.      ED Treatments / Results  Labs (all labs ordered are listed, but only abnormal results are displayed) Labs Reviewed  BASIC METABOLIC PANEL - Abnormal; Notable for the following components:      Result Value   Glucose, Bld 125 (*)    BUN 75 (*)    Creatinine, Ser 2.05 (*)    Calcium 10.4 (*)    GFR calc non Af Amer 24 (*)    GFR calc Af Amer 28 (*)    All other components within normal limits  BRAIN NATRIURETIC PEPTIDE - Abnormal; Notable for the following components:   B Natriuretic Peptide 196.4 (*)    All other components within normal limits  TROPONIN I - Abnormal; Notable for the following components:  Troponin I 0.08 (*)    All other components within normal limits  CBC WITH DIFFERENTIAL/PLATELET - Abnormal; Notable for the following components:   WBC 11.7 (*)    Neutro Abs 9.0 (*)    All other components within normal limits  SARS CORONAVIRUS 2 (HOSPITAL ORDER, [facility])  SEDIMENTATION RATE    EKG EKG Interpretation  Date/Time:  Monday March 16 2019 12:14:54 EDT Ventricular Rate:  81 PR Interval:    QRS Duration: 137 QT Interval:  436 QTC Calculation: 507 R Axis:   -54  Text Interpretation:  Sinus rhythm Left bundle branch block no significant change since Feb 2020 Confirmed by Sherwood Gambler [phone]) on 03/16/2019 12:41:20 PM   Radiology Dg Chest 2 View  Result Date: 03/16/2019 CLINICAL DATA:  Shortness of breath with exertion. Leg swelling. History of CHF. EXAM: CHEST - 2 VIEW COMPARISON:  CT chest dated January 23, 2019. Chest x-ray dated November 27, 2018. FINDINGS: Stable cardiomegaly. New pulmonary vascular congestion and mild interstitial thickening. No focal consolidation, pleural effusion, or pneumothorax. No acute osseous abnormality. IMPRESSION: 1. New mild interstitial pulmonary edema. Electronically Signed   By: Titus Dubin M.D.   On: 03/16/2019 13:05   Ct Head Wo Contrast  Result Date: 03/16/2019 CLINICAL DATA:  Headache. EXAM: CT HEAD WITHOUT CONTRAST TECHNIQUE: Contiguous axial images were obtained from the base of the skull through the vertex without intravenous contrast. COMPARISON:  None. FINDINGS: Brain: Mild chronic ischemic white matter disease is noted. No mass effect or midline shift is noted. Ventricular size is within normal limits. There is no evidence of mass lesion, hemorrhage or acute infarction. Vascular: No hyperdense vessel or unexpected calcification. Skull: Normal. Negative for fracture or focal lesion. Sinuses/Orbits: No acute finding. Other: None. IMPRESSION: Mild chronic ischemic white matter disease. No acute intracranial abnormality seen. Electronically Signed   By: Marijo Conception M.D.   On: 03/16/2019 15:04    Procedures Procedures (including critical care time)  Medications Ordered in ED Medications  furosemide (LASIX) injection 80 mg (has no administration in time range)  aspirin chewable tablet 324 mg (324 mg Oral Given 03/16/19 1511)     Initial Impression / Assessment and Plan / ED Course  I have reviewed the triage vital signs and the nursing notes.  Pertinent labs & imaging results that were available during my  care of the patient were reviewed by me and considered in my medical decision making (see chart for details).        Patient's work-up is consistent with an acute CHF exacerbation.  She does have the on and off right-sided headache and given she is on Plavix a CT head was obtained but does not show any obvious acute pathology.  No fever or acute neurologic symptoms.  ESR negative given the unilateral pain near her temple.  Otherwise, her low level troponin appears baseline.  However I estimate she will need multiple doses of IV diuretic to help diuresis and given her significant dyspnea and near syncope at home I think she will need admission and monitoring.  Dr. Tamala Julian will admit.  Final Clinical Impressions(s) / ED Diagnoses   Final diagnoses:  Acute on chronic congestive heart failure, unspecified heart failure type [facility])    ED Discharge Orders    None       Sherwood Gambler, MD 03/16/19 [phone]

## 2019-03-16 NOTE — Telephone Encounter (Signed)
  CSW contacted patient to follow up on weekly food delivery package. Patient informed of no face to face contact during delivery. CSW discussed transition option for food delivery as the Covid 19 Food relief program will be ending on March 18, 2019. Patient verbalizes understanding and grateful for the assistance.  CSW continues to follow for supportive needs. Raquel Sarna, [place], [place]

## 2019-03-16 NOTE — ED Notes (Signed)
Pt denies cp at this time. 324mg  ASA given.

## 2019-03-17 ENCOUNTER — Encounter (HOSPITAL_COMMUNITY): Payer: Self-pay | Admitting: General Practice

## 2019-03-17 DIAGNOSIS — R7989 Other specified abnormal findings of blood chemistry: Secondary | ICD-10-CM

## 2019-03-17 DIAGNOSIS — R079 Chest pain, unspecified: Secondary | ICD-10-CM

## 2019-03-17 DIAGNOSIS — I10 Essential (primary) hypertension: Secondary | ICD-10-CM

## 2019-03-17 DIAGNOSIS — I5023 Acute on chronic systolic (congestive) heart failure: Secondary | ICD-10-CM

## 2019-03-17 LAB — CBC WITH DIFFERENTIAL/PLATELET
Abs Immature Granulocytes: 0.05 10*3/uL (ref 0.00–0.07)
Basophils Absolute: 0 10*3/uL (ref 0.0–0.1)
Basophils Relative: 0 %
Eosinophils Absolute: 0.1 10*3/uL (ref 0.0–0.5)
Eosinophils Relative: 1 %
HCT: 36.9 % (ref 36.0–46.0)
Hemoglobin: 11.6 g/dL — ABNORMAL LOW (ref 12.0–15.0)
Immature Granulocytes: 0 %
Lymphocytes Relative: 17 %
Lymphs Abs: 2.2 10*3/uL (ref 0.7–4.0)
MCH: 26.5 pg (ref 26.0–34.0)
MCHC: 31.4 g/dL (ref 30.0–36.0)
MCV: 84.2 fL (ref 80.0–100.0)
Monocytes Absolute: 0.7 10*3/uL (ref 0.1–1.0)
Monocytes Relative: 6 %
Neutro Abs: 9.6 10*3/uL — ABNORMAL HIGH (ref 1.7–7.7)
Neutrophils Relative %: 76 %
Platelets: 341 10*3/uL (ref 150–400)
RBC: 4.38 MIL/uL (ref 3.87–5.11)
RDW: 15.3 % (ref 11.5–15.5)
WBC: 12.7 10*3/uL — ABNORMAL HIGH (ref 4.0–10.5)
nRBC: 0 % (ref 0.0–0.2)

## 2019-03-17 LAB — BASIC METABOLIC PANEL
Anion gap: 15 (ref 5–15)
BUN: 76 mg/dL — ABNORMAL HIGH (ref 8–23)
CO2: 26 mmol/L (ref 22–32)
Calcium: 10.2 mg/dL (ref 8.9–10.3)
Chloride: 99 mmol/L (ref 98–111)
Creatinine, Ser: 2.13 mg/dL — ABNORMAL HIGH (ref 0.44–1.00)
GFR calc Af Amer: 27 mL/min — ABNORMAL LOW (ref >60)
GFR calc non Af Amer: 23 mL/min — ABNORMAL LOW (ref >60)
Glucose, Bld: 172 mg/dL — ABNORMAL HIGH (ref 70–99)
Potassium: 3.8 mmol/L (ref 3.5–5.1)
Sodium: 140 mmol/L (ref 135–145)

## 2019-03-17 LAB — GLUCOSE, CAPILLARY
Glucose-Capillary: 174 mg/dL — ABNORMAL HIGH (ref 70–99)
Glucose-Capillary: 180 mg/dL — ABNORMAL HIGH (ref 70–99)
Glucose-Capillary: 228 mg/dL — ABNORMAL HIGH (ref 70–99)
Glucose-Capillary: 109 mg/dL — ABNORMAL HIGH (ref 70–99)

## 2019-03-17 LAB — TROPONIN I: Troponin I: 0.09 ng/mL (ref <0.03)

## 2019-03-17 MED ORDER — OXYCODONE-ACETAMINOPHEN 5-325 MG PO TABS
1.0000 | ORAL_TABLET | ORAL | Status: DC | PRN
  Administered 2019-03-17 – 2019-03-21 (×10): 1 via ORAL
  Filled 2019-03-17 (×10): qty 1

## 2019-03-17 MED ORDER — METOLAZONE 2.5 MG PO TABS
2.5000 mg | ORAL_TABLET | Freq: Once | ORAL | Status: AC
  Administered 2019-03-17: 2.5 mg via ORAL
  Filled 2019-03-17: qty 1

## 2019-03-17 MED ORDER — ALLOPURINOL 100 MG PO TABS
100.0000 mg | ORAL_TABLET | ORAL | Status: DC
  Administered 2019-03-17 – 2019-03-21 (×6): 100 mg via ORAL
  Filled 2019-03-17 (×7): qty 1

## 2019-03-17 MED ORDER — TIZANIDINE HCL 4 MG PO TABS
4.0000 mg | ORAL_TABLET | Freq: Two times a day (BID) | ORAL | Status: DC
  Administered 2019-03-17 – 2019-03-21 (×9): 4 mg via ORAL
  Filled 2019-03-17 (×9): qty 1

## 2019-03-17 MED ORDER — OXYCODONE HCL 5 MG PO TABS
5.0000 mg | ORAL_TABLET | ORAL | Status: DC | PRN
  Administered 2019-03-17 – 2019-03-20 (×7): 5 mg via ORAL
  Filled 2019-03-17 (×7): qty 1

## 2019-03-17 MED ORDER — INSULIN ASPART PROT & ASPART (70-30 MIX) 100 UNIT/ML ~~LOC~~ SUSP
20.0000 [IU] | Freq: Two times a day (BID) | SUBCUTANEOUS | Status: DC
  Administered 2019-03-17 – 2019-03-18 (×3): 20 [IU] via SUBCUTANEOUS
  Filled 2019-03-17: qty 10

## 2019-03-17 MED ORDER — OXYCODONE-ACETAMINOPHEN 10-325 MG PO TABS: 1.0000 | ORAL_TABLET | ORAL | Status: DC | PRN

## 2019-03-17 MED ORDER — FLUTICASONE PROPIONATE 50 MCG/ACT NA SUSP
2.0000 | NASAL | Status: DC | PRN
  Filled 2019-03-17: qty 16

## 2019-03-17 NOTE — Progress Notes (Signed)
[facility] Progress Note  Patient: [name] [name] VZD:[id number]   PCP: [name], MD DOB: [birth year]   DOA: 03/16/2019   DOS: 03/17/2019   Date of Service: the patient was seen and examined on 03/17/2019  Brief hospital course: Pt. with PMH of systolic CHF, DM type II, CKD stage III, obesity, OSA, and hypothyroidism; admitted on 03/16/2019, presented with complaint of shortnes of breath , was found to have acute on chronic diastolic CHF. Currently further plan is continue diuresis.  Subjective: FEELING SHORTNES OF BREATH, no chest pain, no fever or chills  Assessment and Plan: Systolic CHF exacerbation: Acute on chronic.  Patient reports lower extremity swelling and shortness of breath with 9 pound weight gain.  BNP 196.4 which appears lower than previous.  Mildly chest x-ray showing mild interstitial edema. Last echocardiogram revealed EF of 45 to 50% with diffuse hypokinesis in 10/2018.    - Admit to a progressive bed - Heart failure orders set  initiated  - Continuous pulse oximetry with nasal cannula oxygen as needed to keep O2 saturations >92% - Strict I&Os and daily weights - Elevate lower extremities - Lasix drip at 8 mg/h - Reassess in a.m. and adjust diuresis as needed. - Check echocardiogram if warranted by cardiology - Continue beta-blocker and ACE inhibitor held due to renal insufficiency - Optimize medical management as able - Message sent for cardiology to eval in a.m.  Elevated troponin: Patient notes complaints of chest pressure.  Troponin elevated 0.08.  Suspect secondary to demand. - Continue to trend troponin  Hypercalcemia: Acute.  Calcium mildly elevated at 10.4.  Previously seen mildly elevated during last admission as well in March.  Patient reported to still be taking a multivitamin that likely has calcium in it. - Check ionized calcium in a.m. - Advised patient to discontinue multivitamin  Leukocytosis: Acute. WBC elevated at 11.7.  Patient denies  having any significant infectious symptoms.  Chest x-ray showing only mild interstitial edema. - Follow-up COVID-19 screening - Continue to monitor  Diabetes mellitus type 2: Uncontrolled.  Last hemoglobin A1c 7 on 11/28/2018.  Patient normally on 7525 - Hypoglycemic protocol - Held Trulicity - CBGs q. before meals and at bedtime with moderate SSI for now - Consider restarting home regimen in a.m.  Essential hypertension: Blood pressures mildly elevated. - Continue Coreg  Dizziness: Patient reports feeling as though the room is spinning around her with changes in position.  Question BPPV. - Physical therapy to eval and treat  Chronic kidney disease stage IV: Creatinine appears slightly better than last baseline 2.3. - Continue to monitor with diuresis  COPD, without exacerbation: Patient reports having wheezing but no wheezing appreciated on physical exam. - Continue pharmacy substitution of Dulera, albuterol nebs as needed for shortness of breath/wheezing  Hypothyroidism: No recent TSH on file. - Check TSH - Continue levothyroxine   Dyslipidemia - Continue fenofibrate and simvastatin  Tobacco use: Patient still reports smoking 5-6 cigarettes/day on average.  Declined nicotine patch. - Counseled on the need of cessation of tobacco use.  Diet: Cardiac diet DVT Prophylaxis: Subcutaneous Heparin   Advance goals of care discussion: full code  Family Communication: no family was present at bedside, at the time of interview.   Disposition:  Discharge to Home .  Consultants: cardiology  Procedures: none  Scheduled Meds: . allopurinol  100 mg Oral BH-q7a  . carvedilol  6.25 mg Oral BID WC  . clopidogrel  75 mg Oral Daily  . heparin  5,000 Units Subcutaneous  Q8H  . insulin aspart  0-15 Units Subcutaneous TID WC  . insulin aspart protamine- aspart  20 Units Subcutaneous BID WC  . levothyroxine  150 mcg Oral QAC breakfast  . mirabegron ER  50 mg Oral Daily  .  mometasone-formoterol  2 puff Inhalation BID  . simvastatin  20 mg Oral QHS  . sodium chloride flush  3 mL Intravenous Q12H  . tiZANidine  4 mg Oral BID   Continuous Infusions: . sodium chloride    . furosemide (LASIX) infusion 8 mg/hr (03/17/19 1235)   PRN Meds: sodium chloride, acetaminophen, albuterol, fluticasone, hydrOXYzine, loratadine, ondansetron (ZOFRAN) IV, oxyCODONE-acetaminophen **AND** oxyCODONE, polyvinyl alcohol, sodium chloride flush Antibiotics: Anti-infectives (From admission, onward)   None       Objective: Physical Exam: Vitals:   03/17/19 0722 03/17/19 0920 03/17/19 1145 03/17/19 1745  BP: (!) 181/85  (!) 179/93 (!) 151/71  Pulse: 86  82 81  Resp: (!) 21  20 18   Temp: 98.2 F (36.8 C)  97.7 F (36.5 C)   TempSrc: Oral  Oral   SpO2: 94% 94% 95%   Weight:      Height:        Intake/Output Summary (Last 24 hours) at 03/17/2019 1751 Last data filed at 03/17/2019 1514 Gross per 24 hour  Intake 1262.64 ml  Output 1200 ml  Net 62.64 ml   Filed Weights   03/16/19 2004 03/17/19 0320  Weight: 117.5 kg 116 kg   General: alert and oriented to time, place, and person. Appear in moderate distress, affect appropriate Eyes: PERRL, Conjunctiva normal ENT: Oral Mucosa Clear, moist  Neck: no JVD, no Abnormal Mass Or lumps Cardiovascular: S1 and S2 Present, no Murmur, peripheral pulses symmetrical Respiratory: normal respiratory effort, Bilateral Air entry equal and Decreased, no use of accessory muscle, Clear to Auscultation, no Crackles, no wheezes Abdomen: Bowel Sound present, Soft and no tenderness, no hernia Skin: no rashes  Extremities: no Pedal edema, no calf tenderness Neurologic: normal without focal findings, mental status, speech normal, alert and oriented x3, PERLA, Motor strength 5/5 and symmetric and sensation grossly normal to light touch Gait not checked due to patient safety concerns  Data Reviewed: CBC: Recent Labs  Lab 03/16/19 1341 03/17/19  0413  WBC 11.7* 12.7*  NEUTROABS 9.0* 9.6*  HGB 12.1 11.6*  HCT 38.4 36.9  MCV 85.0 84.2  PLT 338 465   Basic Metabolic Panel: Recent Labs  Lab 03/16/19 1341 03/17/19 0413  NA 141 140  K 3.9 3.8  CL 100 99  CO2 27 26  GLUCOSE 125* 172*  BUN 75* 76*  CREATININE 2.05* 2.13*  CALCIUM 10.4* 10.2    Liver Function Tests: No results for input(s): AST, ALT, ALKPHOS, BILITOT, PROT, ALBUMIN in the last 168 hours. No results for input(s): LIPASE, AMYLASE in the last 168 hours. No results for input(s): AMMONIA in the last 168 hours. Coagulation Profile: No results for input(s): INR, PROTIME in the last 168 hours. Cardiac Enzymes: Recent Labs  Lab 03/16/19 1341 03/16/19 1730 03/16/19 2138 03/17/19 0413  TROPONINI 0.08* 0.08* 0.09* 0.09*   BNP (last 3 results) No results for input(s): PROBNP in the last 8760 hours. CBG: Recent Labs  Lab 03/16/19 1621 03/16/19 2010 03/17/19 0620 03/17/19 1147 03/17/19 1606  GLUCAP 120* 212* 174* 180* 228*   Studies: No results found.   Time spent: 35 minutes  Author: Berle Mull, MD [facility] 03/17/2019 5:51 PM  To reach On-call, see care teams to locate the  attending and reach out to them via [email or web address]. If 7PM-7AM, please contact night-coverage If you still have difficulty reaching the attending provider, please page the [facility] (Director on Call) for [facility] on amion for assistance.

## 2019-03-17 NOTE — Evaluation (Signed)
Physical Therapy Evaluation Patient Details Name: [name] [name] MRN: [id number] DOB: 1951-03-22 Today's Date: 03/17/2019   History of Present Illness  68 y.o. female admitted with SOB, edema, CHF exacerbation. PMHx: systolic CHF, DM type II, CKD stage III, gout, RT THA, cellulitis, obesity, OSA, and hypothyroidism  Clinical Impression  Pt sitting in bed, pleasant and eager to return home.  Pt reports feeling dizzy for the past week with turning left. Performed Marye Round with no nystagmus bil but symptoms provoked with right head turn immediately lasting 12 sec rating of 6/10 dizziness and delayed onset to left after 5 sec lasting only 5 sec. Performed Epley maneuver right to left with pt reporting no symptoms after maneuver. Pt educated for canalith repositioning with handout provided for home performance. Pt with decreased strength, gait, mobility and vestibular dysfunction who will benefit from acute therapy to maximize mobility function and safety to return home.      Follow Up Recommendations Home health PT(vestibular if symptoms persist)    Equipment Recommendations  None recommended by PT    Recommendations for Other Services       Precautions / Restrictions Precautions Precautions: Fall Restrictions Weight Bearing Restrictions: No      Mobility  Bed Mobility Overal bed mobility: Modified Independent                Transfers Overall transfer level: Modified independent                  Ambulation/Gait Ambulation/Gait assistance: Min guard Gait Distance (Feet): 150 Feet Assistive device: Rolling walker (2 wheeled) Gait Pattern/deviations: Step-through pattern;Decreased stride length;Trunk flexed   Gait velocity interpretation: 1.31 - 2.62 ft/sec, indicative of limited community ambulator General Gait Details: pt with reliance on RW for gait with standing rest x 2 due to back and shoulder pain. pt with HR 94-130 with gait. Cues for posture and  position in RW  Stairs            Wheelchair Mobility    Modified Rankin (Stroke Patients Only)       Balance Overall balance assessment: Needs assistance   Sitting balance-Leahy Scale: Good     Standing balance support: Bilateral upper extremity supported Standing balance-Leahy Scale: Poor                               Pertinent Vitals/Pain Pain Assessment: 0-10 Pain Score: 4  Pain Location: rt shoulder Pain Descriptors / Indicators: Aching Pain Intervention(s): Limited activity within patient's tolerance;Monitored during session;Repositioned    Home Living Family/patient expects to be discharged to:: Private residence Living Arrangements: Alone Available Help at Discharge: Family;Personal care attendant;Available PRN/intermittently Type of Home: Apartment Home Access: Stairs to enter   Entrance Stairs-Number of Steps: 3 Home Layout: One level Home Equipment: Hand held shower head;Shower seat;Cane - single point;Walker - 4 wheels      Prior Function Level of Independence: Needs assistance   Gait / Transfers Assistance Needed: Ambulatory with intermittent use of rollator or SPC. Community amb with rollator  ADL's / Homemaking Assistance Needed: Aide comes 3 hrs/day for 6 days/wk to assist with household tasks (food prep, cleaning) and ADLs (bathing, setting out clothes, dressing)        Hand Dominance        Extremity/Trunk Assessment   Upper Extremity Assessment Upper Extremity Assessment: Generalized weakness    Lower Extremity Assessment Lower Extremity Assessment: Generalized weakness  Cervical / Trunk Assessment Cervical / Trunk Assessment: Kyphotic  Communication   Communication: No difficulties  Cognition Arousal/Alertness: Awake/alert Behavior During Therapy: WFL for tasks assessed/performed Overall Cognitive Status: Within Functional Limits for tasks assessed                                         General Comments      Exercises     Assessment/Plan    PT Assessment Patient needs continued PT services  PT Problem List Decreased balance;Decreased activity tolerance;Decreased coordination;Other (comment);Decreased strength;Decreased mobility;Decreased knowledge of use of DME(vestibular dysfunction)       PT Treatment Interventions DME instruction;Functional mobility training;Balance training;Patient/family education;Other (comment);Therapeutic activities;Gait training;Therapeutic exercise(vestibular training)    PT Goals (Current goals can be found in the Care Plan section)  Acute Rehab PT Goals Patient Stated Goal: return home PT Goal Formulation: With patient Time For Goal Achievement: 03/31/19 Potential to Achieve Goals: Good    Frequency Min 3X/week   Barriers to discharge Decreased caregiver support      Co-evaluation               AM-PAC PT "6 Clicks" Mobility  Outcome Measure Help needed turning from your back to your side while in a flat bed without using bedrails?: A Little Help needed moving from lying on your back to sitting on the side of a flat bed without using bedrails?: A Little Help needed moving to and from a bed to a chair (including a wheelchair)?: A Little Help needed standing up from a chair using your arms (e.g., wheelchair or bedside chair)?: A Little Help needed to walk in hospital room?: A Little Help needed climbing 3-5 steps with a railing? : A Little 6 Click Score: 18    End of Session Equipment Utilized During Treatment: Gait belt Activity Tolerance: Patient tolerated treatment well Patient left: in chair;with call bell/phone within reach Nurse Communication: Mobility status PT Visit Diagnosis: Other abnormalities of gait and mobility (R26.89)    Time: 8333-8329 PT Time Calculation (min) (ACUTE ONLY): 47 min   Charges:   PT Evaluation $PT Eval Moderate Complexity: 1 Mod PT Treatments $Therapeutic Activity: 8-22  mins $Canalith Rep Proc: 8-22 mins        Catron, PT Acute Rehabilitation Services Pager: [phone] Office: [place] 03/17/2019, 12:35 PM

## 2019-03-17 NOTE — Plan of Care (Signed)
  Problem: Health Behavior/Discharge Planning: Goal: Ability to manage health-related needs will improve Outcome: Progressing   Problem: Clinical Measurements: Goal: Will remain free from infection Outcome: Progressing Goal: Respiratory complications will improve Outcome: Progressing   Problem: Activity: Goal: Risk for activity intolerance will decrease Outcome: Progressing   Problem: Coping: Goal: Level of anxiety will decrease Outcome: Progressing   Problem: Elimination: Goal: Will not experience complications related to bowel motility Outcome: Progressing

## 2019-03-17 NOTE — Consult Note (Addendum)
Advanced HF Consult Note    Patient ID: [name] [name] MRN: [id number], DOB/AGE: October 05, 1951   Admit date: 03/16/2019 Date of Consult: 03/17/2019  Primary Physician: [name], [name] [name] Primary Cardiologist: [name] Champagne, [name] [name] Requesting Provider: Fuller Plan, [name] [name]  Patient Profile    [name] [name] is a 68 y.o. female with a history of normal coronary arteries on cardiac catheterization in 06/7988, chronic diastolic CHF, PAD followed by Dr. Oneida Alar,  prior TIAs, hypertension, hyperlipidemia, diabetes mellitus, hypothyroidism, CKD stage IV followed by Dr. Florene Glen, morbid obesity, and tobacco use who is being seen today for the evaluation of CHF at the request of Dr. Tamala Julian.  History of Present Illness    [name] [name] is a 68 year old female with the above history who is followed by Dr. Aundra Dubin. Patient was admitted from 10/20/2018 to 10/31/2018 for volume overload acute and acute hypoxic respiratory failure secondary to CHF exacerbation despite high dose diuretics at home (Lasix 160mg  twice daily and Metolazone 5mg  three times weekly). Patient ran out of Metolazone prior to admission. Hospital course was complicated by AKI on CKD stage IV. Patient was initially diuresed with IV Lasix and then was transitioned to Torsemide 80mg  in the morning and 40mg  in the evening. Patient was admitted again from 11/27/2018 to 11/28/2018 with shortness of breath and chest pressure in the setting of running out of her diuretics for 3-4 days and a questionable syncopal episode. Work-up was unremarkable. Troponin was flat. Home medications were resumed.  Patient was last seen by [name] Mountain, [name] [name], on 02/06/2019 for a virtual visit at which time she reported getting short of breath with activities of daily living and with walking short distances as well as some worsening edema. Her weight had been trending up to 260 lbs. Patient took an extra dose of Torsemide 40mg  the night prior to this visit and weight down 3 lbs to 257 lbs  with that. No orthopnea or PND. Torsemide 80mg  in the morning and 40mg  in the evening was continued at that time. Patient was advised to take additional 40mg  for the next 2 days. No other medication changes were made.   Patient presented to the ED via EMS yesterday for evaluation of dyspnea with exertion.  Patient reports shortness of breath both at rest and with exertion, PND, lower extremity edema, weight gain over the last week.  Patient estimates gained about 10 pounds in the past month.  She reports compliance with her Torsemide but does state she has been "eating out" some recently.  Patient also reports some atypical chest pain that is reproducible with palpation of chest wall.  Patient states the pain only occurs when she pushes on her chest. She also reports some mild vague chest pressure that started about 1 week ago with her shortness of breath. Patient also reports a recent head that started around her right eye and dizziness which she states is the main reason she came to the ED yesterday. She has noticed some blurry vision at times but no significant vision changes. She reports feeling like she is going to pass out but denies any syncope.  No recent fevers or illnesses.  She does report a cough that is productive at times but denies any known exposure to the coronavirus.  In the ED, vitals stable. EKG showed no acute ischemic changes compared to prior tracings. Initial troponin minimally elevated at 0.08. BNP mildly elevated at 196.4. Chest x-ray showed new mild interstitial pulmonary edema. Head CT showed no acute  intracranial abnormality. WBC 11.7, Hgb 12.1, Plts 338. Na 141, K 3.9, Glucose 125, SCr 2.05. COVID-19 testing negative. Patient received one dose of IV Lasix 80mg  in the ED. Patient was admitted and then started on IV Lasix drip.   At the time of this evaluation, patient has multiple complaints including shortness of breath which she does not feel has improved much since being started  on the Lasix drip. She also reports a headache, dizziness, nausea, right arm pain, chest pain reproducible on exam,  and cramping in her legs. She states a physical therapist came and saw her today and said she had "salt crystals" in her ears which was causing her dizziness. The therapist reportedly gave her exercises to help her dizziness improve. Based on this, it sounds like vertigo but I do not see any documentation in her chart at this time.   Patient reports a 50+ year smoking history and states she currently smokes 5 cigarettes per day. She reports occasional alcohol use but denies any recreational drug use. Patient states her mother had an "enlarged heart" but denies any other known family history of heart disease.   Past Medical History   Past Medical History:  Diagnosis Date  . Anemia   . Arthritis   . Asthma   . Bronchitis   . Chronic kidney disease   . Chronic kidney disease   . Chronic pain syndrome   . Cramping of feet   . Diabetes mellitus   . GERD (gastroesophageal reflux disease)   . Gout   . Headache   . History of hip replacement, total   . History of transfusion   . Hx MRSA infection    abscess left groin  . Hx of cardiac cath 06/2014  . Hyperlipidemia   . Hypertension   . Hypothyroid   . 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 Morbidly obese ([place])   . Peripheral vascular disease ([place])   . Positive cardiac stress test 12/2013   anterior and lateral ischemia on Myoview  . Sleep apnea    UNABLE TO TOLERATE C PAP  . Stress incontinence   . Thyroid disease   . TIA (transient ischemic attack)     X2 NO RESIDUAL PROBLEMS    Past Surgical History:  Procedure Laterality Date  . ABDOMINAL HYSTERECTOMY    . COLON SURGERY  1995   DUE TO POLYP  . FOREARM FRACTURE SURGERY     Left arm  . HERNIA REPAIR     w/ mesh  . INCISION AND DRAINAGE ABSCESS Left 02/04/2013   Procedure: INCISION AND DRAINAGE LEFT BUTTOCK ABSCESS; INCISION AND DRAINAGE LEFT BREAST ABSCESS;   Surgeon: Harl Bowie, [name] [name];  Location: [facility];  Service: General;  Laterality: Left;  . INCISION AND DRAINAGE ABSCESS N/A 02/12/2013   Procedure: INCISION AND DEBRIDEMENT BUTTOCK WOUND ;  Surgeon: [name]. Georgette Dover, [name] [name];  Location: [place];  Service: General;  Laterality: N/A;  . INCISION AND DRAINAGE ABSCESS N/A 02/14/2013   Procedure: INCISION AND DRAINAGE/DRESSING CHANGE;  Surgeon: Harl Bowie, [name] [name];  Location: [facility];  Service: General;  Laterality: N/A;  . INCISION AND DRAINAGE ABSCESS N/A 03/21/2015   Procedure: INCISION AND DRAINAGE PUBIC ABSCESS;  Surgeon: Excell Seltzer, [name] [name];  Location: [facility];  Service: General;  Laterality: N/A;  . INCISION AND DRAINAGE PERIRECTAL ABSCESS Left 02/10/2013   Procedure: IRRIGATION AND DEBRIDEMENT OF BUTTOCK/PERINEAL ABSCESS;  Surgeon: [name]. Georgette Dover, [name] [name];  Location: [facility];  Service: General;  Laterality:  Left;  . INCISION AND DRAINAGE PERIRECTAL ABSCESS N/A 02/16/2013   Procedure: IRRIGATION AND DEBRIDEMENT PERINEAL ABSCESS;  Surgeon: Zenovia Jarred, [name] [name];  Location: [facility];  Service: General;  Laterality: N/A;  . IRRIGATION AND DEBRIDEMENT ABSCESS N/A 02/06/2013   Procedure: IRRIGATION AND DEBRIDEMENT BUTTOCK ABSCESS AND DRESSING CHANGE;  Surgeon: Harl Bowie, [name] [name];  Location: [place];  Service: General;  Laterality: N/A;  . IRRIGATION AND DEBRIDEMENT ABSCESS Left 02/08/2013   Procedure: IRRIGATION AND DEBRIDEMENT ABSCESS/DRESSING CHANGE;  Surgeon: Gwenyth Ober, [name] [name];  Location: [place];  Service: General;  Laterality: Left;  . JOINT REPLACEMENT  2010 / 2012   . LAPAROSCOPIC CHOLECYSTECTOMY    . LEFT HEART CATHETERIZATION WITH CORONARY ANGIOGRAM N/A 07/02/2014   Procedure: LEFT HEART CATHETERIZATION WITH CORONARY ANGIOGRAM;  Surgeon: Lorretta Harp, [name] [name];  Location: [facility];  Service: Cardiovascular;  Laterality: N/A;  . LOWER EXTREMITY ANGIOGRAM Right 04/20/2016   Procedure: Lower Extremity Angiogram;  Surgeon: Elam Dutch, [name] [name];  Location: [place]  CV LAB;  Service: Cardiovascular;  Laterality: Right;  . PERIPHERAL VASCULAR CATHETERIZATION N/A 04/20/2016   Procedure: Abdominal Aortogram;  Surgeon: Elam Dutch, [name] [name];  Location: [place] [facility] CV LAB;  Service: Cardiovascular;  Laterality: N/A;  . PERIPHERAL VASCULAR CATHETERIZATION Right 04/20/2016   Procedure: Peripheral Vascular Intervention;  Surgeon: Elam Dutch, [name] [name];  Location: [place] CV LAB;  Service: Cardiovascular;  Laterality: Right;  popiteal  . RIGHT HEART CATH N/A 10/27/2018   Procedure: RIGHT HEART CATH;  Surgeon: Larey Dresser, [name] [name];  Location: [place] CV LAB;  Service: Cardiovascular;  Laterality: N/A;  . THYROIDECTOMY    . TOTAL HIP REVISION Right 08/11/2014   Procedure: RIGHT ACETABULAR REVISION;  Surgeon: Gearlean Alf, [name] [name];  Location: [facility];  Service: Orthopedics;  Laterality: Right;     Allergies  Allergies  Allergen Reactions  . Nebivolol Swelling  . Ace Inhibitors Swelling and Other (See Comments)    Tongue swell  . Bystolic [Nebivolol Hcl] Swelling and Other (See Comments)    Chest pain   . Morphine And Related Itching    Inpatient Medications    . carvedilol  6.25 mg Oral BID WC  . clopidogrel  75 mg Oral Daily  . heparin  5,000 Units Subcutaneous Q8H  . insulin aspart  0-15 Units Subcutaneous TID WC  . levothyroxine  150 mcg Oral QAC breakfast  . mirabegron ER  50 mg Oral Daily  . mometasone-formoterol  2 puff Inhalation BID  . simvastatin  20 mg Oral QHS  . sodium chloride flush  3 mL Intravenous Q12H    Family History    Family History  Problem Relation Age of Onset  . Diabetes Brother   . Cardiomyopathy Mother   . Heart disease Mother   . Cancer Father    She indicated that her mother is deceased. She indicated that her father is deceased. She indicated that her brother is alive.   Social History    Social History   Socioeconomic History  . Marital status: Legally Separated    Spouse name: Not on file  . Number of  children: 1  . Years of education: college  . Highest education level: Not on file  Occupational History    Comment: Disabled  Social Needs  . Financial resource strain: Not on file  . Food insecurity:    Worry: Not on file    Inability: Not on file  . Transportation needs:  Medical: Not on file    Non-medical: Not on file  Tobacco Use  . Smoking status: Current Some Day Smoker    Packs/day: 0.25    Years: 40.00    Pack years: 10.00    Types: Cigarettes  . Smokeless tobacco: Never Used  Substance and Sexual Activity  . Alcohol use: Yes    Alcohol/week: 1.0 standard drinks    Types: 1 Standard drinks or equivalent per week    Comment: OCC  . Drug use: No    Comment: Quit four years ago.  Marland Kitchen Sexual activity: Not on file  Lifestyle  . Physical activity:    Days per week: Not on file    Minutes per session: Not on file  . Stress: Not on file  Relationships  . Social connections:    Talks on phone: Not on file    Gets together: Not on file    Attends religious service: Not on file    Active member of club or organization: Not on file    Attends meetings of clubs or organizations: Not on file    Relationship status: Not on file  . Intimate partner violence:    Fear of current or ex partner: Not on file    Emotionally abused: Not on file    Physically abused: Not on file    Forced sexual activity: Not on file  Other Topics Concern  . Not on file  Social History Narrative   Patient is separated  and lives at home with her friend. Patient is disabled.   Education one year of college   Right handed   Caffeine two cups daily.        Review of Systems    Review of Systems  Constitutional: Negative for chills and fever.  HENT: Negative for congestion.   Eyes: Positive for blurred vision. Negative for double vision.  Respiratory: Positive for cough, sputum production and shortness of breath. Negative for hemoptysis.   Cardiovascular: Positive for chest pain,  palpitations, orthopnea, leg swelling and PND.  Gastrointestinal: Positive for nausea. Negative for abdominal pain, blood in stool and vomiting.  Genitourinary: Negative for hematuria.  Musculoskeletal: Negative for myalgias.       Right arm pain  Neurological: Positive for dizziness and headaches. Negative for loss of consciousness.  Endo/Heme/Allergies: Positive for environmental allergies. Does not bruise/bleed easily.  Psychiatric/Behavioral: Positive for substance abuse.    Physical Exam    Blood pressure (!) 181/85, pulse 86, temperature 98.2 F (36.8 C), temperature source Oral, resp. rate (!) 21, height 5\' 3"  (1.6 m), weight 116 kg, SpO2 94 %.  General: 68 y.o. obese African-American female resting comfortably in no acute distress. Pleasant and cooperative. HEENT: Normal.  Neck: Supple. JVD difficult to assess due to body habitus.  Lungs: Some mild labored breathing. Diminished breath sound in bases with some mild crackles appreciated. A couple scattered wheezes noted. Heart: RRR. Distinct S1 and S2. No murmurs, gallops, or rubs. Pinpoint tenderness with palpation of chest wall over sternum.  Abdomen: Soft, non-distended, and non-tender to palpation. Bowel sounds present. Extremities: 1+ pitting edema of bilaterally lower extremities. Radial pulses 2+ and equal bilaterally. Skin: Warm and dry. Neuro: Alert and oriented x3. No focal deficits. Moves all extremities spontaneously. Psych: Normal affect. Responds appropriately.   Labs    Troponin (Point of Care Test) No results for input(s): TROPIPOC in the last 72 hours. Recent Labs    03/16/19 1341 03/16/19 1730 03/16/19 2138 03/17/19 0413  TROPONINI 0.08* 0.08* 0.09* 0.09*   Lab Results  Component Value Date   WBC 12.7 (H) 03/17/2019   HGB 11.6 (L) 03/17/2019   HCT 36.9 03/17/2019   MCV 84.2 03/17/2019   PLT 341 03/17/2019    Recent Labs  Lab 03/17/19 0413  NA 140  K 3.8  CL 99  CO2 26  BUN 76*  CREATININE  2.13*  CALCIUM 10.2  GLUCOSE 172*   Lab Results  Component Value Date   CHOL 130 03/22/2015   HDL 27 (L) 03/22/2015   LDLCALC 74 03/22/2015   TRIG 144 03/22/2015   Lab Results  Component Value Date   DDIMER 1.09 (H) 11/27/2018     Radiology Studies    Dg Chest 2 View  Result Date: 03/16/2019 CLINICAL DATA:  Shortness of breath with exertion. Leg swelling. History of CHF. EXAM: CHEST - 2 VIEW COMPARISON:  CT chest dated January 23, 2019. Chest x-ray dated November 27, 2018. FINDINGS: Stable cardiomegaly. New pulmonary vascular congestion and mild interstitial thickening. No focal consolidation, pleural effusion, or pneumothorax. No acute osseous abnormality. IMPRESSION: 1. New mild interstitial pulmonary edema. Electronically Signed   By: Titus Dubin M.D.   On: 03/16/2019 13:05   Ct Head Wo Contrast  Result Date: 03/16/2019 CLINICAL DATA:  Headache. EXAM: CT HEAD WITHOUT CONTRAST TECHNIQUE: Contiguous axial images were obtained from the base of the skull through the vertex without intravenous contrast. COMPARISON:  None. FINDINGS: Brain: Mild chronic ischemic white matter disease is noted. No mass effect or midline shift is noted. Ventricular size is within normal limits. There is no evidence of mass lesion, hemorrhage or acute infarction. Vascular: No hyperdense vessel or unexpected calcification. Skull: Normal. Negative for fracture or focal lesion. Sinuses/Orbits: No acute finding. Other: None. IMPRESSION: Mild chronic ischemic white matter disease. No acute intracranial abnormality seen. Electronically Signed   By: Marijo Conception M.D.   On: 03/16/2019 15:04    EKG     EKG: EKG was personally reviewed and demonstrates: Normal sinus rhythm with known LBBB but no acute ST/T changes compared to prior tracings.  Telemetry: Telemetry was personally reviewed and demonstrates: Sinus rhythm with rates ranging from the 80's to 120's with multiple PVCs.   Cardiac Imaging    Echocardiogram  10/21/2018: Study Conclusions: - Left ventricle: The cavity size was mildly dilated. Wall   thickness was normal. Systolic function was mildly reduced. The   estimated ejection fraction was in the range of 45% to 50%.   Diffuse hypokinesis. Doppler parameters are consistent with both   elevated ventricular end-diastolic filling pressure and elevated   left atrial filling pressure. - Left atrium: The atrium was moderately dilated. _______________  Right Heart Catheterization 10/27/2018: 1. Filling pressures only mildly elevated.  2. Mild pulmonary hypertension.  3. Good cardiac output.   Assessment & Plan    Acute on Chronic Combined CHF - Patient presents with progressive dyspnea on exertion.  - Chest x-ray showed new mildly interstitial pulmonary edema. - BNP mildly elevated at 196.4.  - Echo from 10/2018 showed LVEF of 45-50% with diffuse hypokinesis. Right heart catheterization from 10/27/2018 showed mildly elevated filling pressures with good cardiac output as well as mild pulmonary hypertension. - Patient on Torsemide 80mg  in the morning and 40mg  in the evening at home. She received one dose of IV Lasix 80mg  in the ED and then was started on IV Lasix drip at 8mg /hr. Documented output of 700cc since yesterday.  - Will defer diuresis  to [name] [name].  - Continue home Coreg 6.25mg  twice daily at home. - No ACEI/ARB or Spironolactone due to renal function. - Will need to monitor daily weights, strict I/O's, and renal function.  Chest Pain with Elevated Troponin - Patient reports very atypical chest pain that is reproducible with palpation of chest well as well as some vague chest pressure that started about 1 week ago with the shortness of breath. Patient had normal coronaries on catheterization in 2015.  - EKG shows no acute ST/T changes. - Troponin minimally elevated and flat at 0.08 >> 0.08 >> 0.09 >> 0.09. Not consistent with ACS. Suspect demand ischemia due to acute CHF as well as from CKD  stage IV. - Do not anticipate any additional ischemic work-up at this time.   Hypertension - BP significantly elevated at 181/85 this morning. - Continue home Coreg. - No ACEi/ARB given renal function.  - May need to consider adding Hydralazine for further BP control.   Hyperlipidemia - Continue home Simvastatin 20mg  daily.   Diabetes Mellitus - Management per primary team.   History of TIAs - Patient on Plavix and Simvastatin at home. Continue here.   CKD Stage IV - Serum creatinine 2.05 on admission and 2.13 today after IV Lasix. Recent baseline 2.2 to 3.0.  - Continue to avoid nephrotoxic agents. - Followed by Dr. Florene Glen as an outpatient.  Otherwise, per primary team.   Signed, Darreld [name], PA-C 03/17/2019, 9:05 AM Pager: [phone] For questions or updates, please contact   Please consult [email or web address] for contact info under Cardiology/STEMI.   Patient seen and examined with the above-signed Advanced Practice Provider and/or Housestaff. I personally reviewed laboratory data, imaging studies and relevant notes. I independently examined the patient and formulated the important aspects of the plan. I have edited the note to reflect any of my changes or salient points. I have personally discussed the plan with the patient and/or family.  68 y/o woman as above with multiple medical issues including morbid obesity, tobacco use, CKD IV, DM and diastolic HF. Admitted with worsening HF symptoms in the setting of dietary indiscretion. Says she has been eating out frequently with COVID and weight up 10-15 pounds.  BNP at baseline but CXR with mild pulmonary edema.   On exam Obese woman sitting up in bed mildly SOB Unable to see JVP sue to size Lungs with basilar crackles R>L Ab obese NT Ext warm with 1+ edema  She appears volume overloaded. Currently sluggish diuresis on lasix gtt a 8/hr. Will increase to 15/hr and give one dose of metolazone 2.5. Follow renal function and  electrolytes closely. Counseled on need for smoking cessation.   Glori Bickers, [name] [name]  8:28 PM

## 2019-03-17 NOTE — Progress Notes (Signed)
At pts. Beside to restart IV infusion. Occluded.

## 2019-03-17 NOTE — Progress Notes (Signed)
At patients bedside for shift report.

## 2019-03-18 ENCOUNTER — Inpatient Hospital Stay (HOSPITAL_COMMUNITY): Payer: Medicare Other

## 2019-03-18 ENCOUNTER — Ambulatory Visit: Payer: Medicare Other

## 2019-03-18 DIAGNOSIS — I5033 Acute on chronic diastolic (congestive) heart failure: Secondary | ICD-10-CM

## 2019-03-18 DIAGNOSIS — I5023 Acute on chronic systolic (congestive) heart failure: Secondary | ICD-10-CM

## 2019-03-18 LAB — CALCIUM, IONIZED: Calcium, Ionized, Serum: 5.5 mg/dL (ref 4.5–5.6)

## 2019-03-18 LAB — BASIC METABOLIC PANEL
Anion gap: 9 (ref 5–15)
BUN: 72 mg/dL — ABNORMAL HIGH (ref 8–23)
CO2: 32 mmol/L (ref 22–32)
Calcium: 10.1 mg/dL (ref 8.9–10.3)
Chloride: 99 mmol/L (ref 98–111)
Creatinine, Ser: 1.96 mg/dL — ABNORMAL HIGH (ref 0.44–1.00)
GFR calc Af Amer: 30 mL/min — ABNORMAL LOW (ref >60)
GFR calc non Af Amer: 26 mL/min — ABNORMAL LOW (ref >60)
Glucose, Bld: 160 mg/dL — ABNORMAL HIGH (ref 70–99)
Potassium: 4 mmol/L (ref 3.5–5.1)
Sodium: 140 mmol/L (ref 135–145)

## 2019-03-18 LAB — CBC
HCT: 35.5 % — ABNORMAL LOW (ref 36.0–46.0)
Hemoglobin: 11.1 g/dL — ABNORMAL LOW (ref 12.0–15.0)
MCH: 26.6 pg (ref 26.0–34.0)
MCHC: 31.3 g/dL (ref 30.0–36.0)
MCV: 85.1 fL (ref 80.0–100.0)
Platelets: 326 10*3/uL (ref 150–400)
RBC: 4.17 MIL/uL (ref 3.87–5.11)
RDW: 15.3 % (ref 11.5–15.5)
WBC: 10.6 10*3/uL — ABNORMAL HIGH (ref 4.0–10.5)
nRBC: 0 % (ref 0.0–0.2)

## 2019-03-18 LAB — GLUCOSE, CAPILLARY
Glucose-Capillary: 160 mg/dL — ABNORMAL HIGH (ref 70–99)
Glucose-Capillary: 162 mg/dL — ABNORMAL HIGH (ref 70–99)
Glucose-Capillary: 134 mg/dL — ABNORMAL HIGH (ref 70–99)
Glucose-Capillary: 190 mg/dL — ABNORMAL HIGH (ref 70–99)

## 2019-03-18 LAB — ECHOCARDIOGRAM COMPLETE
Height: 63 in
Weight: 4081.6 oz

## 2019-03-18 LAB — MAGNESIUM: Magnesium: 2.6 mg/dL — ABNORMAL HIGH (ref 1.7–2.4)

## 2019-03-18 MED ORDER — INSULIN ASPART PROT & ASPART (70-30 MIX) 100 UNIT/ML ~~LOC~~ SUSP
20.0000 [IU] | Freq: Two times a day (BID) | SUBCUTANEOUS | Status: DC
  Administered 2019-03-19 – 2019-03-20 (×4): 20 [IU] via SUBCUTANEOUS

## 2019-03-18 MED ORDER — POTASSIUM CHLORIDE CRYS ER 20 MEQ PO TBCR
40.0000 meq | EXTENDED_RELEASE_TABLET | Freq: Once | ORAL | Status: AC
  Administered 2019-03-18: 40 meq via ORAL
  Filled 2019-03-18: qty 2

## 2019-03-18 MED ORDER — ISOSORB DINITRATE-HYDRALAZINE 20-37.5 MG PO TABS
0.5000 | ORAL_TABLET | Freq: Three times a day (TID) | ORAL | Status: DC
  Administered 2019-03-18 – 2019-03-21 (×9): 0.5 via ORAL
  Filled 2019-03-18 (×9): qty 1

## 2019-03-18 MED ORDER — METOLAZONE 2.5 MG PO TABS
2.5000 mg | ORAL_TABLET | Freq: Once | ORAL | Status: AC
  Administered 2019-03-18: 2.5 mg via ORAL
  Filled 2019-03-18: qty 1

## 2019-03-18 NOTE — Plan of Care (Signed)
  Problem: Education: Goal: Knowledge of General Education information will improve Description Including pain rating scale, medication(s)/side effects and non-pharmacologic comfort measures Outcome: Progressing   

## 2019-03-18 NOTE — Progress Notes (Signed)
Patient ID: [name] [name], female   DOB: [birth year], 68 y.o.   MRN: [id number]     Advanced Heart Failure Rounding Note  PCP-Cardiologist: No primary care provider on file.   Subjective:    Good diuresis yesterday, net negative 3108 cc.  Breathing better but still feels like she is not at her baseline.  Creatinine mildly lower today at 1.96.    Objective:   Weight Range: 115.7 kg Body mass index is 45.19 kg/m.   Vital Signs:   Temp:  [97.7 F (36.5 C)-98.7 F (37.1 C)] 97.7 F (36.5 C) (06/10 1113) Pulse Rate:  [75-82] 75 (06/10 1113) Resp:  [18-20] 18 (06/10 1113) BP: (126-184)/(45-93) 126/45 (06/10 1113) SpO2:  [93 %-98 %] 96 % (06/10 1113) Weight:  [115.7 kg] 115.7 kg (06/10 0521) Last BM Date: 03/10/19  Weight change: Filed Weights   03/16/19 2004 03/17/19 0320 03/18/19 0521  Weight: 117.5 kg 116 kg 115.7 kg    Intake/Output:   Intake/Output Summary (Last 24 hours) at 03/18/2019 1141 Last data filed at 03/18/2019 1119 Gross per 24 hour  Intake 942 ml  Output 4150 ml  Net -3208 ml      Physical Exam    General:  Well appearing. No resp difficulty HEENT: Normal Neck: Supple. JVP difficult, suspect at least 10 cm. Carotids 2+ bilat; no bruits. No lymphadenopathy or thyromegaly appreciated. Cor: PMI nonpalpable. Regular rate & rhythm. No rubs, gallops or murmurs. Lungs: Clear Abdomen: Soft, nontender, nondistended. No hepatosplenomegaly. No bruits or masses. Good bowel sounds. Extremities: No cyanosis, clubbing, rash. 1+ ankle edema.  Neuro: Alert & orientedx3, cranial nerves grossly intact. moves all 4 extremities w/o difficulty. Affect pleasant   Telemetry   NSR with short NSVT run. Personally reviewed.   Labs    CBC Recent Labs    03/16/19 1341 03/17/19 0413 03/18/19 0314  WBC 11.7* 12.7* 10.6*  NEUTROABS 9.0* 9.6*  --   HGB 12.1 11.6* 11.1*  HCT 38.4 36.9 35.5*  MCV 85.0 84.2 85.1  PLT 338 341 784   Basic Metabolic Panel Recent Labs    03/17/19 0413 03/18/19 0314  NA 140 140  K 3.8 4.0  CL 99 99  CO2 26 32  GLUCOSE 172* 160*  BUN 76* 72*  CREATININE 2.13* 1.96*  CALCIUM 10.2 10.1  MG  --  2.6*   Liver Function Tests No results for input(s): AST, ALT, ALKPHOS, BILITOT, PROT, ALBUMIN in the last 72 hours. No results for input(s): LIPASE, AMYLASE in the last 72 hours. Cardiac Enzymes Recent Labs    03/16/19 1730 03/16/19 2138 03/17/19 0413  TROPONINI 0.08* 0.09* 0.09*    BNP: BNP (last 3 results) Recent Labs    10/20/18 0853 11/14/18 0936 03/16/19 1341  BNP 500.3* 206.8* 196.4*    ProBNP (last 3 results) No results for input(s): PROBNP in the last 8760 hours.   D-Dimer No results for input(s): DDIMER in the last 72 hours. Hemoglobin A1C No results for input(s): HGBA1C in the last 72 hours. Fasting Lipid Panel No results for input(s): CHOL, HDL, LDLCALC, TRIG, CHOLHDL, LDLDIRECT in the last 72 hours. Thyroid Function Tests Recent Labs    03/16/19 1730  TSH 0.599    Other results:   Imaging     No results found.   Medications:     Scheduled Medications: . allopurinol  100 mg Oral BH-q7a  . carvedilol  6.25 mg Oral BID WC  . clopidogrel  75 mg Oral Daily  . heparin  5,000 Units Subcutaneous Q8H  . insulin aspart  0-15 Units Subcutaneous TID WC  . insulin aspart protamine- aspart  20 Units Subcutaneous BID WC  . isosorbide-hydrALAZINE  0.5 tablet Oral TID  . levothyroxine  150 mcg Oral QAC breakfast  . metolazone  2.5 mg Oral Once  . mirabegron ER  50 mg Oral Daily  . mometasone-formoterol  2 puff Inhalation BID  . potassium chloride  40 mEq Oral Once  . simvastatin  20 mg Oral QHS  . sodium chloride flush  3 mL Intravenous Q12H  . tiZANidine  4 mg Oral BID     Infusions: . sodium chloride    . furosemide (LASIX) infusion 15 mg/hr (03/18/19 0807)     PRN Medications:  sodium chloride, acetaminophen, albuterol, fluticasone, hydrOXYzine, loratadine, ondansetron  (ZOFRAN) IV, oxyCODONE-acetaminophen **AND** oxyCODONE, polyvinyl alcohol, sodium chloride flush   Assessment/Plan    1. 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 She had been on torsemide 80 qam/40 qpm at home but became progressively short of breath. She diuresed well yesterday with Lasix gtt + metolazone. Creatinine lower today.  Exam difficult for volume but probably volume overloaded still.  - Continue Lasix gtt at 15 mg/hr today and will give another dose of metolazone 2.5 x 1.  Possibly back to po diuretics tomorrow.  - Coreg 6.25 mg bid.  - Add Bidil 1/2 tab tid with ongoing high BP.  - Repeat echo.  2. Elevated troponin: Patient reports very atypical chest pain that was reproducible with palpation of chest wall as well as some vague chest pressure that started about 1 week ago with the shortness of breath. Patient had normal coronaries on catheterization in 2015. EKG showed no acute ST/T changes.  Mild troponin elevation with no trend.  Suspect demand ischemia with volume overload.  3. HTN: BP remains high.  Will add Bidil as above.  4. Hyperlipidemia: Continue home Simvastatin 20 mg daily.  5. Diabetes Mellitus: Management per primary team.  6. History of TIAs: Patient on Plavix and Simvastatin at home. Continue here.  7. CKD Stage IV: Recent baseline creatinine 2.2 to 3.0. Creatinine 1.96 today, mildly lower.  Follow BMET closely with diuresis.  8. Smoking: Encouraged her to quit.   Length of Stay: 2  Loralie Champagne, MD  03/18/2019, 11:41 AM  Advanced Heart Failure Team Pager [phone] (M-F; 7a - 4p)  Please contact [facility] for night-coverage after hours (4p -7a ) and weekends on [email or web address]

## 2019-03-18 NOTE — Progress Notes (Signed)
  Echocardiogram 2D Echocardiogram has been performed.  [name] [name] 03/18/2019, 2:52 PM

## 2019-03-18 NOTE — Progress Notes (Signed)
Physical Therapy Treatment Patient Details Name: [name] [name] MRN: [id number] DOB: 1951-04-10 Today's Date: 03/18/2019    History of Present Illness 68 y.o. female admitted with SOB, edema, CHF exacerbation. PMHx: systolic CHF, DM type II, CKD stage III, gout, RT THA, cellulitis, obesity, OSA, and hypothyroidism    PT Comments    Pt admitted with above diagnosis. Pt currently with functional limitations due to balance and endurance deficits. Pt was able to ambulatewith min guard assist with RW and incr distance.  Endurance poor and pt needs cues to self regulate when she needs to rest.  Has rollator at home so that will help pt.  Vertigo resolved today.  Pt will benefit from skilled PT to increase their independence and safety with mobility to allow discharge to the venue listed below.     Follow Up Recommendations  Home health PT;Supervision/Assistance - 24 hour     Equipment Recommendations  None recommended by PT    Recommendations for Other Services       Precautions / Restrictions Precautions Precautions: Fall Restrictions Weight Bearing Restrictions: No    Mobility  Bed Mobility Overal bed mobility: Modified Independent                Transfers Overall transfer level: Modified independent                  Ambulation/Gait Ambulation/Gait assistance: Min guard Gait Distance (Feet): 180 Feet Assistive device: Rolling walker (2 wheeled) Gait Pattern/deviations: Step-through pattern;Decreased stride length;Trunk flexed   Gait velocity interpretation: 1.31 - 2.62 ft/sec, indicative of limited community ambulator General Gait Details: pt with reliance on RW for gait with standing rest x 2 due to back and shoulder pain. Pt leans on RW to stretch back. pt with HR 94-130 with gait. DOE 3/4 at end of walk but O2 sats were 93% throughout on RA. Cues for posture and position in RW   Stairs             Wheelchair Mobility    Modified Rankin (Stroke  Patients Only)       Balance Overall balance assessment: Needs assistance   Sitting balance-Leahy Scale: Good     Standing balance support: Bilateral upper extremity supported Standing balance-Leahy Scale: Poor Standing balance comment: relies on UE support                            Cognition Arousal/Alertness: Awake/alert Behavior During Therapy: WFL for tasks assessed/performed Overall Cognitive Status: Within Functional Limits for tasks assessed                                        Exercises      General Comments        Pertinent Vitals/Pain Pain Assessment: 0-10 Pain Score: 4  Pain Location: rt shoulder Pain Descriptors / Indicators: Aching Pain Intervention(s): Limited activity within patient's tolerance;Monitored during session;Repositioned;Premedicated before session    Home Living                      Prior Function            PT Goals (current goals can now be found in the care plan section) Acute Rehab PT Goals Patient Stated Goal: return home Progress towards PT goals: Progressing toward goals    Frequency    Min  3X/week      PT Plan Current plan remains appropriate    Co-evaluation              AM-PAC PT "6 Clicks" Mobility   Outcome Measure  Help needed turning from your back to your side while in a flat bed without using bedrails?: A Little Help needed moving from lying on your back to sitting on the side of a flat bed without using bedrails?: A Little Help needed moving to and from a bed to a chair (including a wheelchair)?: A Little Help needed standing up from a chair using your arms (e.g., wheelchair or bedside chair)?: A Little Help needed to walk in hospital room?: A Little Help needed climbing 3-5 steps with a railing? : A Little 6 Click Score: 18    End of Session Equipment Utilized During Treatment: Gait belt Activity Tolerance: Patient tolerated treatment well Patient left: in  chair;with call bell/phone within reach Nurse Communication: Mobility status PT Visit Diagnosis: Other abnormalities of gait and mobility (R26.89)     Time: 2992-4268 PT Time Calculation (min) (ACUTE ONLY): 16 min  Charges:  $Gait Training: 8-22 mins                     [place] Pager:  [phone]  Office:  [place] 03/18/2019, 1:27 PM

## 2019-03-18 NOTE — Progress Notes (Signed)
[facility] Progress Note  Patient: [name] [name] TML:[id number]   PCP: [name], MD DOB: 10-28-1950   DOA: 03/16/2019   DOS: 03/18/2019   Date of Service: the patient was seen and examined on 03/18/2019  Brief hospital course: Pt. with PMH of systolic CHF, DM type II, CKD stage III, obesity, OSA, and hypothyroidism; admitted on 03/16/2019, presented with complaint of shortnes of breath , was found to have acute on chronic diastolic CHF. Currently further plan is continue diuresis.  Subjective: Still has shortness of breath.  No nausea no vomiting.  While walking had to take a long break.  No chest pain abdominal pain.  No nausea no vomiting.  Assessment and Plan: Systolic CHF exacerbation: Acute on chronic.  Patient reports lower extremity swelling and shortness of breath with 9 pound weight gain.  BNP 196.4 which appears lower than previous.  Mildly chest x-ray showing mild interstitial edema. Last echocardiogram revealed EF of 45 to 50% with diffuse hypokinesis in 10/2018.    Initially started on IV Lasix infusion. Advanced heart failure team consulted. Continue Lasix GTT for now.  Zaroxolyn added.  Elevated troponin: Patient notes complaints of chest pressure.  Troponin elevated 0.08.  Suspect secondary to demand. Follow-up on echocardiogram.  Hypercalcemia: Acute.  Calcium mildly elevated at 10.4.  Previously seen mildly elevated during last admission as well in March.  Patient reported to still be taking a multivitamin that likely has calcium in it. Advised patient to discontinue multivitamin  Leukocytosis: Acute. WBC elevated at 11.7.  Patient denies having any significant infectious symptoms.  Chest x-ray showing only mild interstitial edema. Currently no evidence of active infection.  No further work-up.  Diabetes mellitus type 2: Uncontrolled.  Last hemoglobin A1c 7 on 11/28/2018.  Patient normally on 7525 - Hypoglycemic protocol - Held Trulicity - CBGs q. before  meals and at bedtime with moderate SSI for now - Continue home insulin regimen.  Essential hypertension: Blood pressures mildly elevated. - Continue Coreg  Dizziness: Patient reports feeling as though the room is spinning around her with changes in position.  Question BPPV. - Physical therapy to eval and treat  Chronic kidney disease stage IV: Creatinine appears slightly better than last baseline 2.3. - Continue to monitor with diuresis  COPD, without exacerbation: Patient reports having wheezing but no wheezing appreciated on physical exam. - Continue pharmacy substitution of Dulera, albuterol nebs as needed for shortness of breath/wheezing  Hypothyroidism: No recent TSH on file. - Normal TSH - Continue levothyroxine   Dyslipidemia - Continue fenofibrate and simvastatin  Tobacco use: Patient still reports smoking 5-6 cigarettes/day on average.  Declined nicotine patch. - Counseled on the need of cessation of tobacco use.  Diet: Cardiac diet DVT Prophylaxis: Subcutaneous Heparin   Advance goals of care discussion: full code  Family Communication: no family was present at bedside, at the time of interview.   Disposition:  Discharge to Home .  Consultants: cardiology  Procedures: none  Scheduled Meds: . allopurinol  100 mg Oral BH-q7a  . carvedilol  6.25 mg Oral BID WC  . clopidogrel  75 mg Oral Daily  . heparin  5,000 Units Subcutaneous Q8H  . insulin aspart  0-15 Units Subcutaneous TID WC  . insulin aspart protamine- aspart  20 Units Subcutaneous BID WC  . isosorbide-hydrALAZINE  0.5 tablet Oral TID  . levothyroxine  150 mcg Oral QAC breakfast  . mirabegron ER  50 mg Oral Daily  . mometasone-formoterol  2 puff Inhalation BID  .  simvastatin  20 mg Oral QHS  . sodium chloride flush  3 mL Intravenous Q12H  . tiZANidine  4 mg Oral BID   Continuous Infusions: . sodium chloride    . furosemide (LASIX) infusion 15 mg/hr (03/18/19 0807)   PRN Meds: sodium  chloride, acetaminophen, albuterol, fluticasone, hydrOXYzine, loratadine, ondansetron (ZOFRAN) IV, oxyCODONE-acetaminophen **AND** oxyCODONE, polyvinyl alcohol, sodium chloride flush Antibiotics: Anti-infectives (From admission, onward)   None       Objective: Physical Exam: Vitals:   03/18/19 1113 03/18/19 1740 03/18/19 1935 03/18/19 1939  BP: (!) 126/45 (!) 145/78  (!) 124/55  Pulse: 75 84 89 84  Resp: 18  20 17   Temp: 97.7 F (36.5 C)   98.4 F (36.9 C)  TempSrc: Oral   Oral  SpO2: 96%  95% 95%  Weight:      Height:        Intake/Output Summary (Last 24 hours) at 03/18/2019 1943 Last data filed at 03/18/2019 1725 Gross per 24 hour  Intake 702 ml  Output 4700 ml  Net -3998 ml   Filed Weights   03/16/19 2004 03/17/19 0320 03/18/19 0521  Weight: 117.5 kg 116 kg 115.7 kg   General: alert and oriented to time, place, and person. Appear in moderate distress, affect appropriate Eyes: PERRL, Conjunctiva normal ENT: Oral Mucosa Clear, moist  Neck: no JVD, no Abnormal Mass Or lumps Cardiovascular: S1 and S2 Present, no Murmur, peripheral pulses symmetrical Respiratory: normal respiratory effort, Bilateral Air entry equal and Decreased, no use of accessory muscle, Clear to Auscultation, no Crackles, no wheezes Abdomen: Bowel Sound present, Soft and no tenderness, no hernia Skin: no rashes  Extremities: no Pedal edema, no calf tenderness Neurologic: normal without focal findings, mental status, speech normal, alert and oriented x3, PERLA, Motor strength 5/5 and symmetric and sensation grossly normal to light touch Gait not checked due to patient safety concerns  Data Reviewed: CBC: Recent Labs  Lab 03/16/19 1341 03/17/19 0413 03/18/19 0314  WBC 11.7* 12.7* 10.6*  NEUTROABS 9.0* 9.6*  --   HGB 12.1 11.6* 11.1*  HCT 38.4 36.9 35.5*  MCV 85.0 84.2 85.1  PLT 338 341 371   Basic Metabolic Panel: Recent Labs  Lab 03/16/19 1341 03/17/19 0413 03/18/19 0314  NA 141 140  140  K 3.9 3.8 4.0  CL 100 99 99  CO2 27 26 32  GLUCOSE 125* 172* 160*  BUN 75* 76* 72*  CREATININE 2.05* 2.13* 1.96*  CALCIUM 10.4* 10.2 10.1  MG  --   --  2.6*    Liver Function Tests: No results for input(s): AST, ALT, ALKPHOS, BILITOT, PROT, ALBUMIN in the last 168 hours. No results for input(s): LIPASE, AMYLASE in the last 168 hours. No results for input(s): AMMONIA in the last 168 hours. Coagulation Profile: No results for input(s): INR, PROTIME in the last 168 hours. Cardiac Enzymes: Recent Labs  Lab 03/16/19 1341 03/16/19 1730 03/16/19 2138 03/17/19 0413  TROPONINI 0.08* 0.08* 0.09* 0.09*   BNP (last 3 results) No results for input(s): PROBNP in the last 8760 hours. CBG: Recent Labs  Lab 03/17/19 1606 03/17/19 2026 03/18/19 0621 03/18/19 1115 03/18/19 1603  GLUCAP 228* 109* 160* 162* 134*   Studies: No results found.   Time spent: 35 minutes  Author: Berle Mull, MD [facility] 03/18/2019 7:43 PM  To reach On-call, see care teams to locate the attending and reach out to them via [email or web address]. If 7PM-7AM, please contact night-coverage If you still have difficulty reaching  the attending provider, please page the [facility] (Director on Call) for [facility] on amion for assistance.

## 2019-03-19 ENCOUNTER — Inpatient Hospital Stay (HOSPITAL_COMMUNITY): Payer: Medicare Other

## 2019-03-19 DIAGNOSIS — I5043 Acute on chronic combined systolic (congestive) and diastolic (congestive) heart failure: Secondary | ICD-10-CM

## 2019-03-19 DIAGNOSIS — N179 Acute kidney failure, unspecified: Secondary | ICD-10-CM

## 2019-03-19 DIAGNOSIS — I5021 Acute systolic (congestive) heart failure: Secondary | ICD-10-CM

## 2019-03-19 LAB — GLUCOSE, CAPILLARY
Glucose-Capillary: 137 mg/dL — ABNORMAL HIGH (ref 70–99)
Glucose-Capillary: 148 mg/dL — ABNORMAL HIGH (ref 70–99)
Glucose-Capillary: 134 mg/dL — ABNORMAL HIGH (ref 70–99)
Glucose-Capillary: 226 mg/dL — ABNORMAL HIGH (ref 70–99)
Glucose-Capillary: 139 mg/dL — ABNORMAL HIGH (ref 70–99)

## 2019-03-19 LAB — CBC
HCT: 35.3 % — ABNORMAL LOW (ref 36.0–46.0)
Hemoglobin: 10.9 g/dL — ABNORMAL LOW (ref 12.0–15.0)
MCH: 26.3 pg (ref 26.0–34.0)
MCHC: 30.9 g/dL (ref 30.0–36.0)
MCV: 85.1 fL (ref 80.0–100.0)
Platelets: 327 10*3/uL (ref 150–400)
RBC: 4.15 MIL/uL (ref 3.87–5.11)
RDW: 15.5 % (ref 11.5–15.5)
WBC: 11 10*3/uL — ABNORMAL HIGH (ref 4.0–10.5)
nRBC: 0 % (ref 0.0–0.2)

## 2019-03-19 LAB — BASIC METABOLIC PANEL
Anion gap: 13 (ref 5–15)
BUN: 80 mg/dL — ABNORMAL HIGH (ref 8–23)
CO2: 32 mmol/L (ref 22–32)
Calcium: 10.2 mg/dL (ref 8.9–10.3)
Chloride: 94 mmol/L — ABNORMAL LOW (ref 98–111)
Creatinine, Ser: 2.8 mg/dL — ABNORMAL HIGH (ref 0.44–1.00)
GFR calc Af Amer: 19 mL/min — ABNORMAL LOW (ref >60)
GFR calc non Af Amer: 17 mL/min — ABNORMAL LOW (ref >60)
Glucose, Bld: 141 mg/dL — ABNORMAL HIGH (ref 70–99)
Potassium: 4.1 mmol/L (ref 3.5–5.1)
Sodium: 139 mmol/L (ref 135–145)

## 2019-03-19 LAB — MAGNESIUM: Magnesium: 2.7 mg/dL — ABNORMAL HIGH (ref 1.7–2.4)

## 2019-03-19 MED ORDER — PANTOPRAZOLE SODIUM 40 MG PO TBEC
40.0000 mg | DELAYED_RELEASE_TABLET | Freq: Every day | ORAL | Status: DC
  Administered 2019-03-19 – 2019-03-21 (×5): 40 mg via ORAL
  Filled 2019-03-19 (×4): qty 1

## 2019-03-19 NOTE — Progress Notes (Addendum)
PROGRESS NOTE    [name] [name]  GDJ:[id number]  DOB: 1951/02/24  DOA: 03/16/2019 PCP: [name], MD  Brief Narrative:  68 y/o female with h/o systolic CHF EF 62-22%, DM type II, CKDstage III,COPD, obesity, OSA,andhypothyroidism; admitted on 03/16/2019, presented with complaint of shortnes of breath , was found to have acute on chronic CHF. Patient seen by cardiology and been receiving IV diuresis through Lasix gtt and Metolazone oral. She also has elevated troponin but felt to be secondary to demand ischemia.   Subjective:   Patient today reports trouble with expiration > inspiration. She also feels she has to catch her breath while swallowing as she feels her food is stuck at times in middle of chest. Is an active smoker but denies any wheezing. She reports being diagnosed with COPD but does not use home 02. Leg swellings much improved. Seen by cardiology today and diuresis held in view of worsening creatinine.  Objective: Vitals:   03/19/19 0410 03/19/19 0749 03/19/19 0800 03/19/19 0859  BP:    (!) 117/96  Pulse:    83  Resp:      Temp:      TempSrc:      SpO2:  95% 95% (!) 88%  Weight: 115.4 kg     Height:        Intake/Output Summary (Last 24 hours) at 03/19/2019 1557 Last data filed at 03/19/2019 1457 Gross per 24 hour  Intake 1642.12 ml  Output 3700 ml  Net -2057.88 ml   Filed Weights   03/17/19 0320 03/18/19 0521 03/19/19 0410  Weight: 116 kg 115.7 kg 115.4 kg    Physical Examination:  General exam: Appears calm and comfortable  Respiratory system: Decreased breath sounds at bases right greater than left  to auscultation. Respiratory effort normal. Cardiovascular system: S1 & S2 heard, RRR. No JVD, murmurs, rubs, gallops or clicks.  Trace pedal edema. Gastrointestinal system: Abdomen obese, soft and nontender. No organomegaly or masses felt. Normal bowel sounds heard. Central nervous system: Alert and oriented. No focal neurological deficits. Extremities:  Symmetric 5 x 5 power. Skin: No rashes, lesions or ulcers Psychiatry: Judgement and insight appear normal. Mood & affect appropriate.     Data Reviewed: I have personally reviewed following labs and imaging studies  CBC: Recent Labs  Lab 03/16/19 1341 03/17/19 0413 03/18/19 0314 03/19/19 0525  WBC 11.7* 12.7* 10.6* 11.0*  NEUTROABS 9.0* 9.6*  --   --   HGB 12.1 11.6* 11.1* 10.9*  HCT 38.4 36.9 35.5* 35.3*  MCV 85.0 84.2 85.1 85.1  PLT 338 341 326 979   Basic Metabolic Panel: Recent Labs  Lab 03/16/19 1341 03/17/19 0413 03/18/19 0314 03/19/19 0525  NA 141 140 140 139  K 3.9 3.8 4.0 4.1  CL 100 99 99 94*  CO2 27 26 32 32  GLUCOSE 125* 172* 160* 141*  BUN 75* 76* 72* 80*  CREATININE 2.05* 2.13* 1.96* 2.80*  CALCIUM 10.4* 10.2 10.1 10.2  MG  --   --  2.6* 2.7*   GFR: Estimated Creatinine Clearance: 23.6 mL/min (A) (by C-G formula based on SCr of 2.8 mg/dL (H)). Liver Function Tests: No results for input(s): AST, ALT, ALKPHOS, BILITOT, PROT, ALBUMIN in the last 168 hours. No results for input(s): LIPASE, AMYLASE in the last 168 hours. No results for input(s): AMMONIA in the last 168 hours. Coagulation Profile: No results for input(s): INR, PROTIME in the last 168 hours. Cardiac Enzymes: Recent Labs  Lab 03/16/19 1341 03/16/19 1730 03/16/19  2138 03/17/19 0413  TROPONINI 0.08* 0.08* 0.09* 0.09*   BNP (last 3 results) No results for input(s): PROBNP in the last 8760 hours. HbA1C: No results for input(s): HGBA1C in the last 72 hours. CBG: Recent Labs  Lab 03/18/19 1603 03/18/19 2122 03/19/19 0630 03/19/19 1129 03/19/19 1414  GLUCAP 134* 190* 137* 148* 134*   Lipid Profile: No results for input(s): CHOL, HDL, LDLCALC, TRIG, CHOLHDL, LDLDIRECT in the last 72 hours. Thyroid Function Tests: Recent Labs    03/16/19 1730  TSH 0.599   Anemia Panel: No results for input(s): VITAMINB12, FOLATE, FERRITIN, TIBC, IRON, RETICCTPCT in the last 72 hours. Sepsis  Labs: No results for input(s): PROCALCITON, LATICACIDVEN in the last 168 hours.  Recent Results (from the past 240 hour(s))  SARS Coronavirus 2 (CEPHEID - Performed in [address] hospital lab), Hosp Order     Status: None   Collection Time: 03/16/19  3:00 PM   Specimen: Nasopharyngeal Swab  Result Value Ref Range Status   SARS Coronavirus 2 NEGATIVE NEGATIVE Final    Comment: (NOTE) If result is NEGATIVE SARS-CoV-2 target nucleic acids are NOT DETECTED. The SARS-CoV-2 RNA is generally detectable in upper and lower  respiratory specimens during the acute phase of infection. The lowest  concentration of SARS-CoV-2 viral copies this assay can detect is 250  copies / mL. A negative result does not preclude SARS-CoV-2 infection  and should not be used as the sole basis for treatment or other  patient management decisions.  A negative result may occur with  improper specimen collection / handling, submission of specimen other  than nasopharyngeal swab, presence of viral mutation(s) within the  areas targeted by this assay, and inadequate number of viral copies  (<250 copies / mL). A negative result must be combined with clinical  observations, patient history, and epidemiological information. If result is POSITIVE SARS-CoV-2 target nucleic acids are DETECTED. The SARS-CoV-2 RNA is generally detectable in upper and lower  respiratory specimens dur ing the acute phase of infection.  Positive  results are indicative of active infection with SARS-CoV-2.  Clinical  correlation with patient history and other diagnostic information is  necessary to determine patient infection status.  Positive results do  not rule out bacterial infection or co-infection with other viruses. If result is PRESUMPTIVE POSTIVE SARS-CoV-2 nucleic acids MAY BE PRESENT.   A presumptive positive result was obtained on the submitted specimen  and confirmed on repeat testing.  While 2019 novel coronavirus  (SARS-CoV-2)  nucleic acids may be present in the submitted sample  additional confirmatory testing may be necessary for epidemiological  and / or clinical management purposes  to differentiate between  SARS-CoV-2 and other Sarbecovirus currently known to infect humans.  If clinically indicated additional testing with an alternate test  methodology [phone]) is advised. The SARS-CoV-2 RNA is generally  detectable in upper and lower respiratory sp ecimens during the acute  phase of infection. The expected result is Negative. Fact Sheet for Patients:  StrictlyIdeas.no Fact Sheet for Healthcare Providers: BankingDealers.co.za This test is not yet approved or cleared by the [place] FDA and has been authorized for detection and/or diagnosis of SARS-CoV-2 by FDA under an Emergency Use Authorization (EUA).  This EUA will remain in effect (meaning this test can be used) for the duration of the COVID-19 declaration under Section 564(b)(1) of the Act, 21 U.S.C. section 360bbb-3(b)(1), unless the authorization is terminated or revoked sooner. Performed at [facility], [address],  [place] [address]       Radiology Studies: Dg Esophagus W Single Cm (sol Or Thin Ba)  Result Date: 03/19/2019 CLINICAL DATA:  Idiopathic dysphagia. Food sticking in chest. EXAM: ESOPHOGRAM/BARIUM SWALLOW TECHNIQUE: Single contrast examination was performed using thin barium and barium tablet. FLUOROSCOPY TIME:  Fluoroscopy Time:  2 minutes 24 seconds Radiation Exposure Index (if provided by the fluoroscopic device): 33.0 mGy Number of Acquired Spot Images: 0 COMPARISON:  None. FINDINGS: Single-contrast study shows no evidence of esophageal mass or stricture. Moderate esophageal dysmotility is seen with break up of primary peristalsis in the upper thoracic esophagus causing esophageal stasis. A 13 mm barium tablet stated in the lower esophagus due to esophageal  stasis. No evidence of hiatal hernia. No gastroesophageal reflux seen during the exam. IMPRESSION: 1. Moderate esophageal dysmotility. 2. No evidence of esophageal stricture or hiatal hernia. Electronically Signed   By: Earle Gell M.D.   On: 03/19/2019 13:54        Scheduled Meds:  allopurinol  100 mg Oral BH-q7a   carvedilol  6.25 mg Oral BID WC   clopidogrel  75 mg Oral Daily   heparin  5,000 Units Subcutaneous Q8H   insulin aspart  0-15 Units Subcutaneous TID WC   insulin aspart protamine- aspart  20 Units Subcutaneous BID WC   isosorbide-hydrALAZINE  0.5 tablet Oral TID   levothyroxine  150 mcg Oral QAC breakfast   mirabegron ER  50 mg Oral Daily   mometasone-formoterol  2 puff Inhalation BID   pantoprazole  40 mg Oral Daily   simvastatin  20 mg Oral QHS   sodium chloride flush  3 mL Intravenous Q12H   tiZANidine  4 mg Oral BID   Continuous Infusions:  sodium chloride      Assessment & Plan:    1.  Acute on chronic combined systolic/diastolic CHF exacerbation: BNP on admission 196.  Last echo in January showed EF of 45 to 50%.  Repeat echo this admission shows EF 40 to 45% with impaired LV relaxation.  Patient reported 9 pound weight gain with lower extremity swelling.  I's and O's showed -2.4 L.  Weight down from 259 pounds on admission to 254 pounds now.  Leg swellings much improved.  Saturating well on room air.  Given rising creatinine, diuretics have been held for today by cardiology. Although some of her symptoms could be related to underlying COPD/dysphagia/reflux disease, she has no wheezing or significant leg swellings on exam.  Cardiology contemplating on right heart cath for fluid assessment.  Continue Coreg, nitrates and hydralazine.  2.  Chronic kidney disease stage IV: Patient's GFR has fluctuated between 20 to 30 in 2016-2017 and earlier this year fluctuated mostly between 15-20.  Patient serum creatinine has been chronically elevated ranging  between 1.9 to 3.3.  Patient's creatinine was 2.0 on presentation this time, stable last couple of days but abruptly jumped to 2.8 today prompting hold on diuresis by cardiology.  Patient apparently follows Dr. Danie Chandler with [facility].  Will consult in a.m. if creatinine worsens or plan for cath/contrast by cardiology.  3.  Dysphagia: Upper GI series today revealed thoracic esophageal dysmotility which explains her symptoms.  Will avoid Reglan given concern for prolonged QTC on prior EKG.  Will add PPI for possible associated reflux/respiratory symptoms and consult GI (discussed with [facility] on-call) for additional recommendations.  Soft diet.   4.  COPD/tobacco use: Patient reports being diagnosed with COPD but continues to smoke.  She is  saturating well on room air currently.  No active wheezing on exam.  Nebs PRN.  Patient counseled to quit smoking and advised the risks of malignancy, worsening heart disease and renal failure.  5. Hypertension/hyperlipidemia: Resume home medications  6.  Diabetes mellitus: On 70/30 insulin 20 units twice daily.  Watch for hypoglycemia with fluctuating renal function.  7.  History of TIA: On Plavix/statins.  8.  Hypothyroidism: Synthroid   DVT prophylaxis: Heparin Code Status: Full code Family / Patient Communication: Discussed with patient and all questions answered Disposition Plan: Home when medically cleared     LOS: 3 days    Time spent:     Guilford Shi, MD [facility] Pager [phone]  If 7PM-7AM, please contact night-coverage [email or web address] Password [facility] 03/19/2019, 3:57 PM

## 2019-03-19 NOTE — Progress Notes (Signed)
Patient ID: [name] [name], female   DOB: [birth year], 68 y.o.   MRN: [id number]     Advanced Heart Failure Rounding Note  PCP-Cardiologist: No primary care provider on file.   Subjective:    Good diuresis again yesterday with Lasix gtt + metolazone 2.5, net negative 2408 cc.  However, weight has not changed much. Creatinine has jumped up to 2.8.  Main complaint today is difficulty swallowing.  When she tries to swallow, she feels like food is getting stuck, then she gets dyspnea and chest pain. Still with orthopnea.   Echo with EF 40-45%, RV not visualized.    Objective:   Weight Range: 115.4 kg Body mass index is 45.06 kg/m.   Vital Signs:   Temp:  [97.7 F (36.5 C)-98.4 F (36.9 C)] 97.7 F (36.5 C) (06/11 0407) Pulse Rate:  [73-89] 73 (06/11 0407) Resp:  [17-20] 17 (06/11 0407) BP: (124-145)/(45-78) 130/59 (06/11 0407) SpO2:  [94 %-96 %] 95 % (06/11 0800) Weight:  [115.4 kg] 115.4 kg (06/11 0410) Last BM Date: 03/10/19  Weight change: Filed Weights   03/17/19 0320 03/18/19 0521 03/19/19 0410  Weight: 116 kg 115.7 kg 115.4 kg    Intake/Output:   Intake/Output Summary (Last 24 hours) at 03/19/2019 0854 Last data filed at 03/19/2019 0753 Gross per 24 hour  Intake 1642.12 ml  Output 2950 ml  Net -1307.88 ml      Physical Exam    General: NAD Neck: JVP 8-9 cm, no thyromegaly or thyroid nodule.  Lungs: Clear to auscultation bilaterally with normal respiratory effort. CV: Nondisplaced PMI.  Heart regular S1/S2, no S3/S4, no murmur.  Trace ankle edema.   Abdomen: Soft, nontender, no hepatosplenomegaly, no distention.  Skin: Intact without lesions or rashes.  Neurologic: Alert and oriented x 3.  Psych: Normal affect. Extremities: No clubbing or cyanosis.  HEENT: Normal.    Telemetry   NSR. Personally reviewed.   Labs    CBC Recent Labs    03/16/19 1341 03/17/19 0413 03/18/19 0314 03/19/19 0525  WBC 11.7* 12.7* 10.6* 11.0*  NEUTROABS 9.0* 9.6*  --    --   HGB 12.1 11.6* 11.1* 10.9*  HCT 38.4 36.9 35.5* 35.3*  MCV 85.0 84.2 85.1 85.1  PLT 338 341 326 573   Basic Metabolic Panel Recent Labs    03/18/19 0314 03/19/19 0525  NA 140 139  K 4.0 4.1  CL 99 94*  CO2 32 32  GLUCOSE 160* 141*  BUN 72* 80*  CREATININE 1.96* 2.80*  CALCIUM 10.1 10.2  MG 2.6* 2.7*   Liver Function Tests No results for input(s): AST, ALT, ALKPHOS, BILITOT, PROT, ALBUMIN in the last 72 hours. No results for input(s): LIPASE, AMYLASE in the last 72 hours. Cardiac Enzymes Recent Labs    03/16/19 1730 03/16/19 2138 03/17/19 0413  TROPONINI 0.08* 0.09* 0.09*    BNP: BNP (last 3 results) Recent Labs    10/20/18 0853 11/14/18 0936 03/16/19 1341  BNP 500.3* 206.8* 196.4*    ProBNP (last 3 results) No results for input(s): PROBNP in the last 8760 hours.   D-Dimer No results for input(s): DDIMER in the last 72 hours. Hemoglobin A1C No results for input(s): HGBA1C in the last 72 hours. Fasting Lipid Panel No results for input(s): CHOL, HDL, LDLCALC, TRIG, CHOLHDL, LDLDIRECT in the last 72 hours. Thyroid Function Tests Recent Labs    03/16/19 1730  TSH 0.599    Other results:   Imaging    No results found.  Medications:     Scheduled Medications: . allopurinol  100 mg Oral BH-q7a  . carvedilol  6.25 mg Oral BID WC  . clopidogrel  75 mg Oral Daily  . heparin  5,000 Units Subcutaneous Q8H  . insulin aspart  0-15 Units Subcutaneous TID WC  . insulin aspart protamine- aspart  20 Units Subcutaneous BID WC  . isosorbide-hydrALAZINE  0.5 tablet Oral TID  . levothyroxine  150 mcg Oral QAC breakfast  . mirabegron ER  50 mg Oral Daily  . mometasone-formoterol  2 puff Inhalation BID  . simvastatin  20 mg Oral QHS  . sodium chloride flush  3 mL Intravenous Q12H  . tiZANidine  4 mg Oral BID    Infusions: . sodium chloride      PRN Medications: sodium chloride, acetaminophen, albuterol, fluticasone, hydrOXYzine, loratadine,  ondansetron (ZOFRAN) IV, oxyCODONE-acetaminophen **AND** oxyCODONE, polyvinyl alcohol, sodium chloride flush   Assessment/Plan    1. Acute on chronic systolic CHF: Echo in 6/96 with EF 45-50%. Echo this admission with EF 40-45%, RV not visualized. She had been on torsemide 80 qam/40 qpm at home but became progressively short of breath. She diuresed well again yesterday with Lasix gtt + metolazone but weight not much changed.  Creatinine up considerably today to 2.8.  Volume difficult on exam, suspect only mildly volume overloaded.  - Stop Lasix gtt given rise in creatinine, reassess in am.  May need RHC to assess filling pressures given rise in creatinine and difficult exam, will keep NPO at midnight and decide tomorrow am based on clinical trajectory.  - Coreg 6.25 mg bid.  - Continue Bidil 1 tab tid.  2. Elevated troponin: Patient reports very atypical chest pain that was reproducible with palpation of chest wall as well as some vague chest pressure that started about 1 week ago with the shortness of breath. Patient had normal coronaries on catheterization in 2015. EKG showed no acute ST/T changes.  Mild troponin elevation with no trend.  Suspect demand ischemia with volume overload.  3. HTN: BP controlled.  4. Hyperlipidemia: Continue home Simvastatin 20 mg daily.  5. Diabetes Mellitus: Management per primary team.  6. History of TIAs: Patient on Plavix and Simvastatin at home. Continue here.  7. AKI on CKD Stage IV: Recent baseline creatinine 2.2 to 3.0. Creatinine up to 2.8 today from 1.96.   - As above, hold Lasix gtt today.  8. Smoking: Encouraged her to quit. 9. Dysphagia: Patient feels like food getting stuck, associated with dyspnea + chest pain.  - Will order barium swallow.    Length of Stay: 3  Loralie Champagne, MD  03/19/2019, 8:54 AM  Advanced Heart Failure Team Pager [phone] (M-F; 7a - 4p)  Please contact [facility] for night-coverage after hours (4p -7a ) and weekends on  [email or web address]

## 2019-03-19 NOTE — Progress Notes (Signed)
Upon arrival to patient room this AM to give inhaler, RT noted that patient was having trouble catching her breath.  Gave patient PRN Albuterol which patient states helped once finished.  Sats and vitals were stable.  Patient stated that when she eats shes feels as though food is having a harder time going down and patient is having to work harder in order to eat and it takes her breath away.  Instructed patient to inform MD about current issues to make sure patient swallowing is appropriate.  Will check back with patient this afternoon to see if needs another treatment.  Will continue to monitor.

## 2019-03-19 NOTE — Care Management Important Message (Signed)
Important Message  Patient Details  Name: [name] [name] MRN: [id number] Date of Birth: 02-Oct-1951   Medicare Important Message Given:  Yes    Shelda Altes 03/19/2019, 3:23 PM

## 2019-03-19 NOTE — Progress Notes (Signed)
SLP phoned RN, per MD notes, he desired Esophagram but ordered MBS.  Cancelled MBS and SLP consult and ordered correct exam.  Informed Tai RN of plan.   Luanna Salk, [facility] SLP Acute Rehab Services Pager [phone] Office [phone]

## 2019-03-20 ENCOUNTER — Encounter (HOSPITAL_COMMUNITY): Payer: Self-pay | Admitting: Cardiology

## 2019-03-20 ENCOUNTER — Encounter (HOSPITAL_COMMUNITY)
Admission: EM | Disposition: A | Discharge status: Home / Self Care | Payer: Self-pay | Source: Home / Self Care | Attending: Internal Medicine

## 2019-03-20 DIAGNOSIS — I5023 Acute on chronic systolic (congestive) heart failure: Secondary | ICD-10-CM

## 2019-03-20 DIAGNOSIS — R1310 Dysphagia, unspecified: Secondary | ICD-10-CM

## 2019-03-20 HISTORY — PX: RIGHT HEART CATH: CATH118263

## 2019-03-20 LAB — POCT I-STAT EG7
Acid-Base Excess: 7 mmol/L — ABNORMAL HIGH (ref 0.0–2.0)
Acid-Base Excess: 7 mmol/L — ABNORMAL HIGH (ref 0.0–2.0)
Bicarbonate: 32.7 mmol/L — ABNORMAL HIGH (ref 20.0–28.0)
Bicarbonate: 33.3 mmol/L — ABNORMAL HIGH (ref 20.0–28.0)
Calcium, Ion: 1.32 mmol/L (ref 1.15–1.40)
Calcium, Ion: 1.35 mmol/L (ref 1.15–1.40)
HCT: 35 % — ABNORMAL LOW (ref 36.0–46.0)
HCT: 35 % — ABNORMAL LOW (ref 36.0–46.0)
Hemoglobin: 11.9 g/dL — ABNORMAL LOW (ref 12.0–15.0)
Hemoglobin: 11.9 g/dL — ABNORMAL LOW (ref 12.0–15.0)
O2 Saturation: 58 %
O2 Saturation: 63 %
Potassium: 3.9 mmol/L (ref 3.5–5.1)
Potassium: 4 mmol/L (ref 3.5–5.1)
Sodium: 138 mmol/L (ref 135–145)
Sodium: 138 mmol/L (ref 135–145)
TCO2: 34 mmol/L — ABNORMAL HIGH (ref 22–32)
TCO2: 35 mmol/L — ABNORMAL HIGH (ref 22–32)
pCO2, Ven: 53 mmHg (ref 44.0–60.0)
pCO2, Ven: 54.2 mmHg (ref 44.0–60.0)
pH, Ven: 7.396 (ref 7.250–7.430)
pH, Ven: 7.399 (ref 7.250–7.430)
pO2, Ven: 31 mmHg — CL (ref 32.0–45.0)
pO2, Ven: 34 mmHg (ref 32.0–45.0)

## 2019-03-20 LAB — CBC
HCT: 34.6 % — ABNORMAL LOW (ref 36.0–46.0)
Hemoglobin: 10.7 g/dL — ABNORMAL LOW (ref 12.0–15.0)
MCH: 26.2 pg (ref 26.0–34.0)
MCHC: 30.9 g/dL (ref 30.0–36.0)
MCV: 84.8 fL (ref 80.0–100.0)
Platelets: 346 10*3/uL (ref 150–400)
RBC: 4.08 MIL/uL (ref 3.87–5.11)
RDW: 15.3 % (ref 11.5–15.5)
WBC: 12.1 10*3/uL — ABNORMAL HIGH (ref 4.0–10.5)
nRBC: 0 % (ref 0.0–0.2)

## 2019-03-20 LAB — BASIC METABOLIC PANEL
Anion gap: 11 (ref 5–15)
BUN: 92 mg/dL — ABNORMAL HIGH (ref 8–23)
CO2: 30 mmol/L (ref 22–32)
Calcium: 10.2 mg/dL (ref 8.9–10.3)
Chloride: 96 mmol/L — ABNORMAL LOW (ref 98–111)
Creatinine, Ser: 2.77 mg/dL — ABNORMAL HIGH (ref 0.44–1.00)
GFR calc Af Amer: 20 mL/min — ABNORMAL LOW (ref >60)
GFR calc non Af Amer: 17 mL/min — ABNORMAL LOW (ref >60)
Glucose, Bld: 168 mg/dL — ABNORMAL HIGH (ref 70–99)
Potassium: 4 mmol/L (ref 3.5–5.1)
Sodium: 137 mmol/L (ref 135–145)

## 2019-03-20 LAB — GLUCOSE, CAPILLARY
Glucose-Capillary: 166 mg/dL — ABNORMAL HIGH (ref 70–99)
Glucose-Capillary: 211 mg/dL — ABNORMAL HIGH (ref 70–99)
Glucose-Capillary: 179 mg/dL — ABNORMAL HIGH (ref 70–99)
Glucose-Capillary: 178 mg/dL — ABNORMAL HIGH (ref 70–99)

## 2019-03-20 SURGERY — RIGHT HEART CATH
Anesthesia: LOCAL

## 2019-03-20 MED ORDER — HEPARIN (PORCINE) IN NACL 1000-0.9 UT/500ML-% IV SOLN
INTRAVENOUS | Status: DC | PRN
  Administered 2019-03-20: 500 mL

## 2019-03-20 MED ORDER — SODIUM CHLORIDE 0.9% FLUSH
3.0000 mL | Freq: Two times a day (BID) | INTRAVENOUS | Status: DC
  Administered 2019-03-20 (×2): 3 mL via INTRAVENOUS

## 2019-03-20 MED ORDER — SODIUM CHLORIDE 0.9 % IV SOLN
INTRAVENOUS | Status: AC | PRN
  Administered 2019-03-20: 10 mL/h via INTRAVENOUS

## 2019-03-20 MED ORDER — HYDRALAZINE HCL 20 MG/ML IJ SOLN
5.0000 mg | Freq: Once | INTRAMUSCULAR | Status: AC
  Administered 2019-03-20: 5 mg via INTRAVENOUS
  Filled 2019-03-20: qty 1

## 2019-03-20 MED ORDER — LIDOCAINE HCL (PF) 1 % IJ SOLN
INTRAMUSCULAR | Status: AC
  Filled 2019-03-20: qty 30

## 2019-03-20 MED ORDER — PANTOPRAZOLE SODIUM 40 MG PO TBEC
40.0000 mg | DELAYED_RELEASE_TABLET | Freq: Every day | ORAL | Status: DC
  Administered 2019-03-20: 40 mg via ORAL

## 2019-03-20 MED ORDER — LIDOCAINE HCL (PF) 1 % IJ SOLN
INTRAMUSCULAR | Status: DC | PRN
  Administered 2019-03-20: 2 mL

## 2019-03-20 MED ORDER — SODIUM CHLORIDE 0.9% FLUSH: 3.0000 mL | INTRAVENOUS | Status: DC | PRN

## 2019-03-20 MED ORDER — SODIUM CHLORIDE 0.9 % IV SOLN: INTRAVENOUS | Status: DC

## 2019-03-20 MED ORDER — SODIUM CHLORIDE 0.9 % IV SOLN: 250.0000 mL | INTRAVENOUS | Status: DC | PRN

## 2019-03-20 MED ORDER — SODIUM CHLORIDE 0.9 % IV SOLN
INTRAVENOUS | Status: AC
  Administered 2019-03-20: 14:00:00 via INTRAVENOUS

## 2019-03-20 MED ORDER — HEPARIN (PORCINE) IN NACL 1000-0.9 UT/500ML-% IV SOLN
INTRAVENOUS | Status: AC
  Filled 2019-03-20: qty 500

## 2019-03-20 SURGICAL SUPPLY — 7 items
CATH BALLN WEDGE 5F 110CM (CATHETERS) ×2 IMPLANT
KIT HEART LEFT (KITS) ×2 IMPLANT
PACK CARDIAC CATHETERIZATION (CUSTOM PROCEDURE TRAY) ×2 IMPLANT
SHEATH GLIDE SLENDER 4/5FR (SHEATH) ×2 IMPLANT
TRANSDUCER W/STOPCOCK (MISCELLANEOUS) ×2 IMPLANT
TUBING CIL FLEX 10 FLL-RA (TUBING) ×2 IMPLANT
WIRE EMERALD 3MM-J .025X260CM (WIRE) ×2 IMPLANT

## 2019-03-20 NOTE — Consult Note (Signed)
Subjective:   HPI  The patient is a 68 year old female who we are asked to see in regards to dysphagia.  She complains of solid and liquid sticking in the mid chest sometimes.  A barium swallow was done yesterday showing esophageal dysmotility.  No stricture.  Patient also complains of heartburn.  She states the only thing she takes for her heartburn are Tums.     Past Medical History:  Diagnosis Date  . Anemia   . Arthritis   . Asthma   . Bronchitis   . CHF (congestive heart failure) ([place])   . Chronic kidney disease   . Chronic kidney disease   . Chronic pain syndrome   . Cramping of feet   . Diabetes mellitus   . Dyspnea   . GERD (gastroesophageal reflux disease)   . Gout   . Headache   . History of hip replacement, total   . History of transfusion   . Hx MRSA infection    abscess left groin  . Hx of cardiac cath 06/2014  . Hyperlipidemia   . Hypertension   . Hypothyroid   . 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[place])   . Morbidly obese ([place])   . Peripheral vascular disease ([place])   . Positive cardiac stress test 12/2013   anterior and lateral ischemia on Myoview  . Sleep apnea    UNABLE TO TOLERATE C PAP  . Stress incontinence   . Thyroid disease   . TIA (transient ischemic attack)     X2 NO RESIDUAL PROBLEMS   Past Surgical History:  Procedure Laterality Date  . ABDOMINAL HYSTERECTOMY    . COLON SURGERY  1995   DUE TO POLYP  . FOREARM FRACTURE SURGERY     Left arm  . HERNIA REPAIR     w/ mesh  . INCISION AND DRAINAGE ABSCESS Left 02/04/2013   Procedure: INCISION AND DRAINAGE LEFT BUTTOCK ABSCESS; INCISION AND DRAINAGE LEFT BREAST ABSCESS;  Surgeon: Harl Bowie, MD;  Location: [place];  Service: General;  Laterality: Left;  . INCISION AND DRAINAGE ABSCESS N/A 02/12/2013   Procedure: INCISION AND DEBRIDEMENT BUTTOCK WOUND ;  Surgeon: [name]. Georgette Dover, MD;  Location: [place];  Service: General;  Laterality: N/A;  . INCISION AND DRAINAGE ABSCESS N/A 02/14/2013   Procedure: INCISION AND DRAINAGE/DRESSING CHANGE;  Surgeon: Harl Bowie, MD;  Location: [place];  Service: General;  Laterality: N/A;  . INCISION AND DRAINAGE ABSCESS N/A 03/21/2015   Procedure: INCISION AND DRAINAGE PUBIC ABSCESS;  Surgeon: Excell Seltzer, MD;  Location: [facility];  Service: General;  Laterality: N/A;  . INCISION AND DRAINAGE PERIRECTAL ABSCESS Left 02/10/2013   Procedure: IRRIGATION AND DEBRIDEMENT OF BUTTOCK/PERINEAL ABSCESS;  Surgeon: [name]. Georgette Dover, MD;  Location: [place];  Service: General;  Laterality: Left;  . INCISION AND DRAINAGE PERIRECTAL ABSCESS N/A 02/16/2013   Procedure: IRRIGATION AND DEBRIDEMENT PERINEAL ABSCESS;  Surgeon: Zenovia Jarred, MD;  Location: [place];  Service: General;  Laterality: N/A;  . IRRIGATION AND DEBRIDEMENT ABSCESS N/A 02/06/2013   Procedure: IRRIGATION AND DEBRIDEMENT BUTTOCK ABSCESS AND DRESSING CHANGE;  Surgeon: Harl Bowie, MD;  Location: [place];  Service: General;  Laterality: N/A;  . IRRIGATION AND DEBRIDEMENT ABSCESS Left 02/08/2013   Procedure: IRRIGATION AND DEBRIDEMENT ABSCESS/DRESSING CHANGE;  Surgeon: Gwenyth Ober, MD;  Location: [place];  Service: General;  Laterality: Left;  . JOINT REPLACEMENT  2010 / 2012   . LAPAROSCOPIC CHOLECYSTECTOMY    . LEFT HEART CATHETERIZATION WITH  CORONARY ANGIOGRAM N/A 07/02/2014   Procedure: LEFT HEART CATHETERIZATION WITH CORONARY ANGIOGRAM;  Surgeon: Lorretta Harp, MD;  Location[facility];  Service: Cardiovascular;  Laterality: N/A;  . LOWER EXTREMITY ANGIOGRAM Right 04/20/2016   Procedure: Lower Extremity Angiogram;  Surgeon: Elam Dutch, MD;  Location: [place] CV [facility];  Service: Cardiovascular;  Laterality: Right;  . PERIPHERAL VASCULAR CATHETERIZATION N/A 04/20/2016   Procedure: Abdominal Aortogram;  Surgeon: Elam Dutch, MD;  Location: [place] [facility];  Service: Cardiovascular;  Laterality: N/A;  . PERIPHERAL VASCULAR CATHETERIZATION Right 04/20/2016   Procedure:  Peripheral Vascular Intervention;  Surgeon: Elam Dutch, MD;  Location: [place] CV LAB;  Service: Cardiovascular;  Laterality: Right;  popiteal  . RIGHT HEART CATH N/A 10/27/2018   Procedure: RIGHT HEART CATH;  Surgeon: Larey Dresser, MD;  Location: [place] CV LAB;  Service: Cardiovascular;  Laterality: N/A;  . THYROIDECTOMY    . TOTAL HIP REVISION Right 08/11/2014   Procedure: RIGHT ACETABULAR REVISION;  Surgeon: Gearlean Alf, MD;  Location: [facility];  Service: Orthopedics;  Laterality: Right;   Social History   Socioeconomic History  . Marital status: Legally Separated    Spouse name: Not on file  . Number of children: 1  . Years of education: college  . Highest education level: Not on file  Occupational History    Comment: Disabled  Social Needs  . Financial resource strain: Not on file  . Food insecurity    Worry: Not on file    Inability: Not on file  . Transportation needs    Medical: Not on file    Non-medical: Not on file  Tobacco Use  . Smoking status: Current Some Day Smoker    Packs/day: 0.25    Years: 40.00    Pack years: 10.00    Types: Cigarettes  . Smokeless tobacco: Never Used  Substance and Sexual Activity  . Alcohol use: Yes    Alcohol/week: 1.0 standard drinks    Types: 1 Standard drinks or equivalent per week    Comment: OCC  . Drug use: No    Comment: Quit four years ago.  Marland Kitchen Sexual activity: Not on file  Lifestyle  . Physical activity    Days per week: Not on file    Minutes per session: Not on file  . Stress: Not on file  Relationships  . Social Herbalist on phone: Not on file    Gets together: Not on file    Attends religious service: Not on file    Active member of club or organization: Not on file    Attends meetings of clubs or organizations: Not on file    Relationship status: Not on file  . Intimate partner violence    Fear of current or ex partner: Not on file    Emotionally abused: Not on file     Physically abused: Not on file    Forced sexual activity: Not on file  Other Topics Concern  . Not on file  Social History Narrative   Patient is separated  and lives at home with her friend. Patient is disabled.   Education one year of college   Right handed   Caffeine two cups daily.      family history includes Cancer in her father; Cardiomyopathy in her mother; Diabetes in her brother; Heart disease in her mother.  Current Facility-Administered Medications:  .  [MAR Hold] 0.9 %  sodium chloride  infusion, 250 mL, Intravenous, PRN, Smith, Rondell A, MD .  0.9 %  sodium chloride infusion, 250 mL, Intravenous, PRN, Larey Dresser, MD .  Derrill Memo ON 03/21/2019] 0.9 %  sodium chloride infusion, , Intravenous, Continuous, Larey Dresser, MD .  Doug Sou Hold] acetaminophen (TYLENOL) tablet 650 mg, 650 mg, Oral, Q4H PRN, Fuller Plan A, MD, 650 mg at 03/17/19 0903 .  [MAR Hold] albuterol (PROVENTIL) (2.5 MG/3ML) 0.083% nebulizer solution 2.5 mg, 2.5 mg, Nebulization, Q6H PRN, Tamala Julian, Rondell A, MD, 2.5 mg at 03/20/19 0030 .  [MAR Hold] allopurinol (ZYLOPRIM) tablet 100 mg, 100 mg, Oral, Arlee Muslim, MD, 100 mg at 03/20/19 0932 .  [MAR Hold] carvedilol (COREG) tablet 6.25 mg, 6.25 mg, Oral, BID WC, Smith, Rondell A, MD, 6.25 mg at 03/20/19 0803 .  [MAR Hold] clopidogrel (PLAVIX) tablet 75 mg, 75 mg, Oral, Daily, Tamala Julian, Rondell A, MD, 75 mg at 03/20/19 0804 .  [MAR Hold] fluticasone (FLONASE) 50 MCG/ACT nasal spray 2 spray, 2 spray, Each Nare, PRN, Lavina Hamman, MD .  Heparin (Porcine) in NaCl 1000-0.9 UT/500ML-% SOLN, , , PRN, Larey Dresser, MD, 500 mL at 03/20/19 1049 .  [MAR Hold] heparin injection 5,000 Units, 5,000 Units, Subcutaneous, Q8H, Fuller Plan A, MD, 5,000 Units at 03/20/19 0553 .  [MAR Hold] hydrOXYzine (ATARAX/VISTARIL) tablet 25 mg, 25 mg, Oral, BID PRN, Fuller Plan A, MD .  Doug Sou Hold] insulin aspart (novoLOG) injection 0-15 Units, 0-15 Units, Subcutaneous,  TID WC, Fuller Plan A, MD, 3 Units at 03/20/19 0816 .  [MAR Hold] insulin aspart protamine- aspart (NOVOLOG MIX 70/30) injection 20 Units, 20 Units, Subcutaneous, BID WC, Lavina Hamman, MD, 20 Units at 03/19/19 2159 .  [MAR Hold] isosorbide-hydrALAZINE (BIDIL) 20-37.5 MG per tablet 0.5 tablet, 0.5 tablet, Oral, TID, Larey Dresser, MD, 0.5 tablet at 03/20/19 0804 .  [MAR Hold] levothyroxine (SYNTHROID) tablet 150 mcg, 150 mcg, Oral, QAC breakfast, Fuller Plan A, MD, 150 mcg at 03/20/19 0322 .  lidocaine (PF) (XYLOCAINE) 1 % injection, , , PRN, Larey Dresser, MD, 2 mL at 03/20/19 1052 .  [MAR Hold] loratadine (CLARITIN) tablet 10 mg, 10 mg, Oral, Daily PRN, Fuller Plan A, MD .  Doug Sou Hold] mirabegron ER (MYRBETRIQ) tablet 50 mg, 50 mg, Oral, Daily, Smith, Rondell A, MD, 50 mg at 03/19/19 1106 .  [MAR Hold] mometasone-formoterol (DULERA) 200-5 MCG/ACT inhaler 2 puff, 2 puff, Inhalation, BID, Fuller Plan A, MD, 2 puff at 03/20/19 0818 .  [MAR Hold] ondansetron (ZOFRAN) injection 4 mg, 4 mg, Intravenous, Q6H PRN, Norval Morton, MD .  Doug Sou Hold] oxyCODONE-acetaminophen (PERCOCET/ROXICET) 5-325 MG per tablet 1 tablet, 1 tablet, Oral, Q4H PRN, 1 tablet at 03/20/19 0803 **AND** [MAR Hold] oxyCODONE (Oxy IR/ROXICODONE) immediate release tablet 5 mg, 5 mg, Oral, Q4H PRN, Einar Grad, RPH, 5 mg at 03/19/19 2015 .  [MAR Hold] pantoprazole (PROTONIX) EC tablet 40 mg, 40 mg, Oral, Daily, Kamineni, Neelima, MD, 40 mg at 03/20/19 0803 .  [MAR Hold] polyvinyl alcohol (LIQUIFILM TEARS) 1.4 % ophthalmic solution 2 drop, 2 drop, Both Eyes, Daily PRN, Tamala Julian, Rondell A, MD .  Doug Sou Hold] simvastatin (ZOCOR) tablet 20 mg, 20 mg, Oral, QHS, Smith, Rondell A, MD, 20 mg at 03/19/19 2123 .  [MAR Hold] sodium chloride flush (NS) 0.9 % injection 3 mL, 3 mL, Intravenous, Q12H, Smith, Rondell A, MD, 3 mL at 03/20/19 0813 .  [MAR Hold] sodium chloride flush (NS) 0.9 % injection 3 mL, 3  mL, Intravenous, PRN,  Norval Morton, MD .  Doug Sou Hold] sodium chloride flush (NS) 0.9 % injection 3 mL, 3 mL, Intravenous, Q12H, Larey Dresser, MD, 3 mL at 03/20/19 0814 .  sodium chloride flush (NS) 0.9 % injection 3 mL, 3 mL, Intravenous, PRN, Larey Dresser, MD .  Doug Sou Hold] tiZANidine (ZANAFLEX) tablet 4 mg, 4 mg, Oral, BID, Lavina Hamman, MD, 4 mg at 03/20/19 3159 Allergies  Allergen Reactions  . Nebivolol Swelling  . Ace Inhibitors Swelling and Other (See Comments)    Tongue swell  . Bystolic [Nebivolol Hcl] Swelling and Other (See Comments)    Chest pain   . Morphine And Related Itching     Objective:     BP 120/76   Pulse 80   Temp 97.6 F (36.4 C) (Oral)   Resp 16   Ht 5\' 3"  (1.6 m)   Wt 115.7 kg Comment: scale A  SpO2 92%   BMI 45.17 kg/m     No distress  Heart regular rhythm  Lungs clear  Abdomen soft nontender  Laboratory No components found for: D1    Assessment:     Dysphagia secondary to esophageal dysmotility.  There could be a component of reflux causing this since she has heartburn.  There is no evidence of an esophageal stricture.      Plan:     I recommended to the patient that we put her on a PPI to suppress acid.  She needs to chew her food well, keep it moist, and eat slowly.  Not much more that we can do for esophageal dysmotility at this point.  I will start her on a PPI.  Call us again if needed.  No further intervention planned from a GI standpoint.

## 2019-03-20 NOTE — Progress Notes (Signed)
Patient remains NPO for heart cath today. Has consult for PIV due to diffulcult stick. She has been oob to bsc without c/o's noted. Dr.[name] has been in see patient discuss heart cath procedure with patient. Vs stable no distress noted.

## 2019-03-20 NOTE — Plan of Care (Signed)

## 2019-03-20 NOTE — Plan of Care (Signed)
  Problem: Education: Goal: Knowledge of General Education information will improve Description Including pain rating scale, medication(s)/side effects and non-pharmacologic comfort measures Outcome: Progressing   

## 2019-03-20 NOTE — Progress Notes (Addendum)
Patient ID: [name] [name], female   DOB: Aug 23, 1951, 68 y.o.   MRN: [id number]     Advanced Heart Failure Rounding Note  PCP-Cardiologist: No primary care provider on file.   Subjective:    Lasix gtt stopped yesterday with rise in creatinine.  BUN/creatinine remain elevated but relatively stable, 80/2.8 => 92/2.77. She was short of breath and orthopneic last night, needed a breathing treatment.  Better this am.   Echo with EF 40-45%, RV not visualized.    Objective:   Weight Range: 115.7 kg Body mass index is 45.17 kg/m.   Vital Signs:   Temp:  [97.6 F (36.4 C)-98.7 F (37.1 C)] 97.6 F (36.4 C) (06/12 0757) Pulse Rate:  [65-92] 80 (06/12 0757) Resp:  [16-22] 16 (06/12 0757) BP: (117-174)/(55-96) 120/76 (06/12 0757) SpO2:  [88 %-99 %] 92 % (06/12 0757) Weight:  [115.7 kg] 115.7 kg (06/12 0530) Last BM Date: 03/10/19  Weight change: Filed Weights   03/18/19 0521 03/19/19 0410 03/20/19 0530  Weight: 115.7 kg 115.4 kg 115.7 kg    Intake/Output:   Intake/Output Summary (Last 24 hours) at 03/20/2019 0758 Last data filed at 03/20/2019 0600 Gross per 24 hour  Intake 505.13 ml  Output 2400 ml  Net -1894.87 ml      Physical Exam    General: NAD Neck: Thick, JVP difficult, no thyromegaly or thyroid nodule.  Lungs: Decreased BS at bases. CV: Nonpalpable PMI.  Heart regular S1/S2, no S3/S4, no murmur.  Trace ankle edema.   Abdomen: Soft, nontender, no hepatosplenomegaly, no distention.  Skin: Intact without lesions or rashes.  Neurologic: Alert and oriented x 3.  Psych: Normal affect. Extremities: No clubbing or cyanosis.  HEENT: Normal.    Telemetry   NSR. Personally reviewed.   Labs    CBC Recent Labs    03/19/19 0525 03/20/19 0538  WBC 11.0* 12.1*  HGB 10.9* 10.7*  HCT 35.3* 34.6*  MCV 85.1 84.8  PLT 327 568   Basic Metabolic Panel Recent Labs    03/18/19 0314 03/19/19 0525 03/20/19 0538  NA 140 139 137  K 4.0 4.1 4.0  CL 99 94* 96*  CO2  32 32 30  GLUCOSE 160* 141* 168*  BUN 72* 80* 92*  CREATININE 1.96* 2.80* 2.77*  CALCIUM 10.1 10.2 10.2  MG 2.6* 2.7*  --    Liver Function Tests No results for input(s): AST, ALT, ALKPHOS, BILITOT, PROT, ALBUMIN in the last 72 hours. No results for input(s): LIPASE, AMYLASE in the last 72 hours. Cardiac Enzymes No results for input(s): CKTOTAL, CKMB, CKMBINDEX, TROPONINI in the last 72 hours.  BNP: BNP (last 3 results) Recent Labs    10/20/18 0853 11/14/18 0936 03/16/19 1341  BNP 500.3* 206.8* 196.4*    ProBNP (last 3 results) No results for input(s): PROBNP in the last 8760 hours.   D-Dimer No results for input(s): DDIMER in the last 72 hours. Hemoglobin A1C No results for input(s): HGBA1C in the last 72 hours. Fasting Lipid Panel No results for input(s): CHOL, HDL, LDLCALC, TRIG, CHOLHDL, LDLDIRECT in the last 72 hours. Thyroid Function Tests No results for input(s): TSH, T4TOTAL, T3FREE, THYROIDAB in the last 72 hours.  Invalid input(s): FREET3  Other results:   Imaging    Dg Esophagus W Single Cm (sol Or Thin Ba)  Result Date: 03/19/2019 CLINICAL DATA:  Idiopathic dysphagia. Food sticking in chest. EXAM: ESOPHOGRAM/BARIUM SWALLOW TECHNIQUE: Single contrast examination was performed using thin barium and barium tablet. FLUOROSCOPY TIME:  Fluoroscopy  Time:  2 minutes 24 seconds Radiation Exposure Index (if provided by the fluoroscopic device): 33.0 mGy Number of Acquired Spot Images: 0 COMPARISON:  None. FINDINGS: Single-contrast study shows no evidence of esophageal mass or stricture. Moderate esophageal dysmotility is seen with break up of primary peristalsis in the upper thoracic esophagus causing esophageal stasis. A 13 mm barium tablet stated in the lower esophagus due to esophageal stasis. No evidence of hiatal hernia. No gastroesophageal reflux seen during the exam. IMPRESSION: 1. Moderate esophageal dysmotility. 2. No evidence of esophageal stricture or hiatal  hernia. Electronically Signed   By: Earle Gell M.D.   On: 03/19/2019 13:54     Medications:     Scheduled Medications:  allopurinol  100 mg Oral BH-q7a   carvedilol  6.25 mg Oral BID WC   clopidogrel  75 mg Oral Daily   heparin  5,000 Units Subcutaneous Q8H   insulin aspart  0-15 Units Subcutaneous TID WC   insulin aspart protamine- aspart  20 Units Subcutaneous BID WC   isosorbide-hydrALAZINE  0.5 tablet Oral TID   levothyroxine  150 mcg Oral QAC breakfast   mirabegron ER  50 mg Oral Daily   mometasone-formoterol  2 puff Inhalation BID   pantoprazole  40 mg Oral Daily   simvastatin  20 mg Oral QHS   sodium chloride flush  3 mL Intravenous Q12H   sodium chloride flush  3 mL Intravenous Q12H   tiZANidine  4 mg Oral BID    Infusions:  sodium chloride      PRN Medications: sodium chloride, acetaminophen, albuterol, fluticasone, hydrOXYzine, loratadine, ondansetron (ZOFRAN) IV, oxyCODONE-acetaminophen **AND** oxyCODONE, polyvinyl alcohol, sodium chloride flush   Assessment/Plan    1. Acute on chronic systolic CHF: Echo in 4/74 with EF 45-50%. Echo this admission with EF 40-45%, RV not visualized. She had been on torsemide 80 qam/40 qpm at home but became progressively short of breath. She was taken off Lasix gtt with AKI on CKD stage 4.  Volume status difficult to discern on exam, she does not look volume overloaded but is still short of breath and orthopneic.  - I think that she needs a RHC to sort out filling pressures/assess PA pressure.  We discussed risks/benefits and she agrees to procedure.  Will need right brachial IV.  - Coreg 6.25 mg bid.  - Continue Bidil 0.5 tab tid.  2. Elevated troponin: Patient reports very atypical chest pain that was reproducible with palpation of chest wall as well as some vague chest pressure that started about 1 week ago with the shortness of breath. Patient had normal coronaries on catheterization in 2015. EKG showed no acute  ST/T changes.  Mild troponin elevation with no trend.  Suspect demand ischemia with volume overload.  3. HTN: BP controlled.  4. Hyperlipidemia: Continue home Simvastatin 20 mg daily.  5. Diabetes Mellitus: Management per primary team.  6. History of TIAs: Patient on Plavix and Simvastatin at home. Continue here.  7. AKI on CKD Stage IV: Recent baseline creatinine 2.2 to 3.0. BUN/creatinine elevated but stable today.    - Hold off on diuretics for now pending RHC.  8. Smoking: Encouraged her to quit. 9. Dysphagia: Patient feels like food getting stuck, associated with dyspnea + chest pain.  She had barium swallow showing esophageal dysmotility.  - PPI started, agree with no Reglan with somewhat prolonged QTc.     Length of Stay: 4  Loralie Champagne, MD  03/20/2019, 7:58 AM  Advanced Heart Failure Team  Pager [phone] (M-F; [place])  Please contact [facility] for night-coverage after hours (4p -7a ) and weekends on [email or web address]  RHC shows low filling pressures.  Will give gentle IV fluid and hold off on diuretics for now.   Loralie Champagne 03/20/2019 11:11 AM

## 2019-03-20 NOTE — Progress Notes (Signed)
Physical Therapy Treatment Patient Details Name: [name] [name] MRN: [id number] DOB: [birth year] Today's Date: 03/20/2019    History of Present Illness 68 y.o. female admitted with SOB, edema, CHF exacerbation. PMHx: systolic CHF, DM type II, CKD stage III, gout, RT THA, cellulitis, obesity, OSA, and hypothyroidism    PT Comments    Pt frustrated on arrival with lack of food and not know when her cath will happen today but then during gait made aware of pending cath and session limited by prep for cath. Pt states she had some dizziness this morning but upon further questioning not the same as with her BPPV. Performed epley additional time to ensure all crystals in place and prevent return vertigo. Pt very appreciative of canalith repositioning and of ability to increase mobility. Will continue to follow with pt encouraged to walk again with nursing if allowed after cath.  Pt with spO2 >90% on RA throughout    Follow Up Recommendations  Home health PT;Supervision - Intermittent     Equipment Recommendations  None recommended by PT    Recommendations for Other Services       Precautions / Restrictions Precautions Precautions: Fall    Mobility  Bed Mobility Overal bed mobility: Modified Independent             General bed mobility comments: pt able to transition to long sitting in bed, perform Epley maneuver and transition to side and sitting with cues  Transfers Overall transfer level: Independent   Transfers: Sit to/from Stand           General transfer comment: pt able to stand from bed and return to bed without cues or physical assist. Pt stood from bed, to and from [facility], from bed to standing scale and back to bed  Ambulation/Gait Ambulation/Gait assistance: Min guard Gait Distance (Feet): 80 Feet Assistive device: Rolling walker (2 wheeled) Gait Pattern/deviations: Step-through pattern;Decreased stride length;Trunk flexed   Gait velocity interpretation: >2.62  ft/sec, indicative of community ambulatory General Gait Details: reliance on RW for gait with distance limited by pt needing to prep for cardiac cath, cues to posture as pt propping on RW midway through gait   Stairs Stairs: Yes Stairs assistance: Min guard Stair Management: Step to pattern;Sideways;One rail Right Number of Stairs: 3 General stair comments: pt ascended forward with left rail and HHA to mimic cane use, descent sideways with bil hands on rail. pt with good stability with bil UE support for stairs and no SOB   Wheelchair Mobility    Modified Rankin (Stroke Patients Only)       Balance Overall balance assessment: Needs assistance   Sitting balance-Leahy Scale: Good     Standing balance support: Bilateral upper extremity supported Standing balance-Leahy Scale: Poor Standing balance comment: relies on UE support                            Cognition   Behavior During Therapy: WFL for tasks assessed/performed Overall Cognitive Status: Within Functional Limits for tasks assessed                                        Exercises      General Comments        Pertinent Vitals/Pain Pain Assessment: No/denies pain    Home Living  Prior Function            PT Goals (current goals can now be found in the care plan section) Progress towards PT goals: Progressing toward goals    Frequency           PT Plan Current plan remains appropriate    Co-evaluation              AM-PAC PT "6 Clicks" Mobility   Outcome Measure  Help needed turning from your back to your side while in a flat bed without using bedrails?: None Help needed moving from lying on your back to sitting on the side of a flat bed without using bedrails?: None Help needed moving to and from a bed to a chair (including a wheelchair)?: A Little Help needed standing up from a chair using your arms (e.g., wheelchair or bedside  chair)?: None Help needed to walk in hospital room?: A Little Help needed climbing 3-5 steps with a railing? : A Little 6 Click Score: 21    End of Session Equipment Utilized During Treatment: Gait belt Activity Tolerance: Patient tolerated treatment well Patient left: in bed(with transporter for transition to cath lab) Nurse Communication: Mobility status PT Visit Diagnosis: Other abnormalities of gait and mobility (R26.89)     Time: 1001-1027 PT Time Calculation (min) (ACUTE ONLY): 26 min  Charges:  $Gait Training: 8-22 mins $Therapeutic Activity: 8-22 mins                     [place] Pager: [phone] Office: [phone]    Chaun Uemura B Olivine Hiers 03/20/2019, 1:39 PM

## 2019-03-20 NOTE — H&P (View-Only) (Signed)
Patient ID: [name] [name], female   DOB: 26-Dec-1950, 68 y.o.   MRN: [id number]     Advanced Heart Failure Rounding Note  PCP-Cardiologist: No primary care provider on file.   Subjective:    Lasix gtt stopped yesterday with rise in creatinine.  BUN/creatinine remain elevated but relatively stable, 80/2.8 => 92/2.77. She was short of breath and orthopneic last night, needed a breathing treatment.  Better this am.   Echo with EF 40-45%, RV not visualized.    Objective:   Weight Range: 115.7 kg Body mass index is 45.17 kg/m.   Vital Signs:   Temp:  [97.6 F (36.4 C)-98.7 F (37.1 C)] 97.6 F (36.4 C) (06/12 0757) Pulse Rate:  [65-92] 80 (06/12 0757) Resp:  [16-22] 16 (06/12 0757) BP: (117-174)/(55-96) 120/76 (06/12 0757) SpO2:  [88 %-99 %] 92 % (06/12 0757) Weight:  [115.7 kg] 115.7 kg (06/12 0530) Last BM Date: 03/10/19  Weight change: Filed Weights   03/18/19 0521 03/19/19 0410 03/20/19 0530  Weight: 115.7 kg 115.4 kg 115.7 kg    Intake/Output:   Intake/Output Summary (Last 24 hours) at 03/20/2019 0758 Last data filed at 03/20/2019 0600 Gross per 24 hour  Intake 505.13 ml  Output 2400 ml  Net -1894.87 ml      Physical Exam    General: NAD Neck: Thick, JVP difficult, no thyromegaly or thyroid nodule.  Lungs: Decreased BS at bases. CV: Nonpalpable PMI.  Heart regular S1/S2, no S3/S4, no murmur.  Trace ankle edema.   Abdomen: Soft, nontender, no hepatosplenomegaly, no distention.  Skin: Intact without lesions or rashes.  Neurologic: Alert and oriented x 3.  Psych: Normal affect. Extremities: No clubbing or cyanosis.  HEENT: Normal.    Telemetry   NSR. Personally reviewed.   Labs    CBC Recent Labs    03/19/19 0525 03/20/19 0538  WBC 11.0* 12.1*  HGB 10.9* 10.7*  HCT 35.3* 34.6*  MCV 85.1 84.8  PLT 327 660   Basic Metabolic Panel Recent Labs    03/18/19 0314 03/19/19 0525 03/20/19 0538  NA 140 139 137  K 4.0 4.1 4.0  CL 99 94* 96*  CO2  32 32 30  GLUCOSE 160* 141* 168*  BUN 72* 80* 92*  CREATININE 1.96* 2.80* 2.77*  CALCIUM 10.1 10.2 10.2  MG 2.6* 2.7*  --    Liver Function Tests No results for input(s): AST, ALT, ALKPHOS, BILITOT, PROT, ALBUMIN in the last 72 hours. No results for input(s): LIPASE, AMYLASE in the last 72 hours. Cardiac Enzymes No results for input(s): CKTOTAL, CKMB, CKMBINDEX, TROPONINI in the last 72 hours.  BNP: BNP (last 3 results) Recent Labs    10/20/18 0853 11/14/18 0936 03/16/19 1341  BNP 500.3* 206.8* 196.4*    ProBNP (last 3 results) No results for input(s): PROBNP in the last 8760 hours.   D-Dimer No results for input(s): DDIMER in the last 72 hours. Hemoglobin A1C No results for input(s): HGBA1C in the last 72 hours. Fasting Lipid Panel No results for input(s): CHOL, HDL, LDLCALC, TRIG, CHOLHDL, LDLDIRECT in the last 72 hours. Thyroid Function Tests No results for input(s): TSH, T4TOTAL, T3FREE, THYROIDAB in the last 72 hours.  Invalid input(s): FREET3  Other results:   Imaging    Dg Esophagus W Single Cm (sol Or Thin Ba)  Result Date: 03/19/2019 CLINICAL DATA:  Idiopathic dysphagia. Food sticking in chest. EXAM: ESOPHOGRAM/BARIUM SWALLOW TECHNIQUE: Single contrast examination was performed using thin barium and barium tablet. FLUOROSCOPY TIME:  Fluoroscopy  Time:  2 minutes 24 seconds Radiation Exposure Index (if provided by the fluoroscopic device): 33.0 mGy Number of Acquired Spot Images: 0 COMPARISON:  None. FINDINGS: Single-contrast study shows no evidence of esophageal mass or stricture. Moderate esophageal dysmotility is seen with break up of primary peristalsis in the upper thoracic esophagus causing esophageal stasis. A 13 mm barium tablet stated in the lower esophagus due to esophageal stasis. No evidence of hiatal hernia. No gastroesophageal reflux seen during the exam. IMPRESSION: 1. Moderate esophageal dysmotility. 2. No evidence of esophageal stricture or hiatal  hernia. Electronically Signed   By: Earle Gell M.D.   On: 03/19/2019 13:54     Medications:     Scheduled Medications: . allopurinol  100 mg Oral BH-q7a  . carvedilol  6.25 mg Oral BID WC  . clopidogrel  75 mg Oral Daily  . heparin  5,000 Units Subcutaneous Q8H  . insulin aspart  0-15 Units Subcutaneous TID WC  . insulin aspart protamine- aspart  20 Units Subcutaneous BID WC  . isosorbide-hydrALAZINE  0.5 tablet Oral TID  . levothyroxine  150 mcg Oral QAC breakfast  . mirabegron ER  50 mg Oral Daily  . mometasone-formoterol  2 puff Inhalation BID  . pantoprazole  40 mg Oral Daily  . simvastatin  20 mg Oral QHS  . sodium chloride flush  3 mL Intravenous Q12H  . sodium chloride flush  3 mL Intravenous Q12H  . tiZANidine  4 mg Oral BID    Infusions: . sodium chloride      PRN Medications: sodium chloride, acetaminophen, albuterol, fluticasone, hydrOXYzine, loratadine, ondansetron (ZOFRAN) IV, oxyCODONE-acetaminophen **AND** oxyCODONE, polyvinyl alcohol, sodium chloride flush   Assessment/Plan    1. Acute on chronic systolic CHF: Echo in 1/61 with EF 45-50%. Echo this admission with EF 40-45%, RV not visualized. She had been on torsemide 80 qam/40 qpm at home but became progressively short of breath. She was taken off Lasix gtt with AKI on CKD stage 4.  Volume status difficult to discern on exam, she does not look markedly volume overloaded but is still short of breath and orthopneic.  - I think that she needs a RHC to sort out filling pressures/assess PA pressure.  We discussed risks/benefits and she agrees to procedure.  Will need right brachial IV.  - Coreg 6.25 mg bid.  - Continue Bidil 0.5 tab tid.  2. Elevated troponin: Patient reports very atypical chest pain that was reproducible with palpation of chest wall as well as some vague chest pressure that started about 1 week ago with the shortness of breath. Patient had normal coronaries on catheterization in 2015. EKG showed  no acute ST/T changes.  Mild troponin elevation with no trend.  Suspect demand ischemia with volume overload.  3. HTN: BP controlled.  4. Hyperlipidemia: Continue home Simvastatin 20 mg daily.  5. Diabetes Mellitus: Management per primary team.  6. History of TIAs: Patient on Plavix and Simvastatin at home. Continue here.  7. AKI on CKD Stage IV: Recent baseline creatinine 2.2 to 3.0. BUN/creatinine elevated but stable today.    - Hold off on diuretics for now pending RHC.  8. Smoking: Encouraged her to quit. 9. Dysphagia: Patient feels like food getting stuck, associated with dyspnea + chest pain.  She had barium swallow showing esophageal dysmotility.  - PPI started, agree with no Reglan with somewhat prolonged QTc.     Length of Stay: 4  Loralie Champagne, MD  03/20/2019, 7:58 AM  Advanced Heart Failure  Team Pager [phone] (M-F; [place])  Please contact [facility] for night-coverage after hours (4p -7a ) and weekends on [email or web address]

## 2019-03-20 NOTE — Interval H&P Note (Signed)
History and Physical Interval Note:  03/20/2019 10:45 AM  [name] [name]  has presented today for surgery, with the diagnosis of Heart failure.  The various methods of treatment have been discussed with the patient and family. After consideration of risks, benefits and other options for treatment, the patient has consented to  Procedure(s): RIGHT HEART CATH (N/A) as a surgical intervention.  The patient's history has been reviewed, patient examined, no change in status, stable for surgery.  I have reviewed the patient's chart and labs.  Questions were answered to the patient's satisfaction.     [name] [name]

## 2019-03-20 NOTE — Progress Notes (Signed)
PROGRESS NOTE    [name] [name]  RCV:[id number]  DOB: September 16, 1951  DOA: 03/16/2019 PCP: [name], MD  Brief Narrative:  68 y/o female with h/o systolic CHF EF 10-25%, DM type II, CKDstage III,COPD, obesity, OSA,andhypothyroidism; admitted on 03/16/2019, presented with complaint of shortnes of breath , was found to have acute on chronic CHF. Patient seen by cardiology and been receiving IV diuresis through Lasix gtt and Metolazone oral. She also has elevated troponin but felt to be secondary to demand ischemia. During the hospital course patient reported trouble with expiration > inspiration. She also feels she has to catch her breath while swallowing as she feels her food is stuck at times in middle of chest. Is an active smoker but denies any wheezing.  She underwent barium swallow which revealed esophageal dysmotility and GI consulted.   Subjective:   Patient underwent right heart cath by cardiology this morning and now started on IV fluids.  Seen by GI.  Objective: Vitals:   03/20/19 1059 03/20/19 1104 03/20/19 1121 03/20/19 1238  BP: 135/68 (!) 142/74 (!) 142/67 127/75  Pulse: 71 70 72 79  Resp: 20 16  20   Temp:    (!) 97.5 F (36.4 C)  TempSrc:    Oral  SpO2: 98% 98% 100% 97%  Weight:      Height:        Intake/Output Summary (Last 24 hours) at 03/20/2019 1624 Last data filed at 03/20/2019 0958 Gross per 24 hour  Intake 283.13 ml  Output 1000 ml  Net -716.87 ml   Filed Weights   03/18/19 0521 03/19/19 0410 03/20/19 0530  Weight: 115.7 kg 115.4 kg 115.7 kg    Physical Examination:  General exam: Appears calm and comfortable  Respiratory system: Decreased breath sounds at bases right greater than left  to auscultation. Respiratory effort normal. Cardiovascular system: S1 & S2 heard, RRR. No JVD, murmurs, rubs, gallops or clicks.  Trace pedal edema. Gastrointestinal system: Abdomen obese, soft and nontender. No organomegaly or masses felt. Normal bowel sounds  heard. Central nervous system: Alert and oriented. No focal neurological deficits. Extremities: Symmetric 5 x 5 power. Skin: No rashes, lesions or ulcers Psychiatry: Judgement and insight appear normal. Mood & affect appropriate.     Data Reviewed: I have personally reviewed following labs and imaging studies  CBC: Recent Labs  Lab 03/16/19 1341 03/17/19 0413 03/18/19 0314 03/19/19 0525 03/20/19 0538  WBC 11.7* 12.7* 10.6* 11.0* 12.1*  NEUTROABS 9.0* 9.6*  --   --   --   HGB 12.1 11.6* 11.1* 10.9* 10.7*  HCT 38.4 36.9 35.5* 35.3* 34.6*  MCV 85.0 84.2 85.1 85.1 84.8  PLT 338 341 326 327 852   Basic Metabolic Panel: Recent Labs  Lab 03/16/19 1341 03/17/19 0413 03/18/19 0314 03/19/19 0525 03/20/19 0538  NA 141 140 140 139 137  K 3.9 3.8 4.0 4.1 4.0  CL 100 99 99 94* 96*  CO2 27 26 32 32 30  GLUCOSE 125* 172* 160* 141* 168*  BUN 75* 76* 72* 80* 92*  CREATININE 2.05* 2.13* 1.96* 2.80* 2.77*  CALCIUM 10.4* 10.2 10.1 10.2 10.2  MG  --   --  2.6* 2.7*  --    GFR: Estimated Creatinine Clearance: 23.8 mL/min (A) (by C-G formula based on SCr of 2.77 mg/dL (H)). Liver Function Tests: No results for input(s): AST, ALT, ALKPHOS, BILITOT, PROT, ALBUMIN in the last 168 hours. No results for input(s): LIPASE, AMYLASE in the last 168 hours. No results  for input(s): AMMONIA in the last 168 hours. Coagulation Profile: No results for input(s): INR, PROTIME in the last 168 hours. Cardiac Enzymes: Recent Labs  Lab 03/16/19 1341 03/16/19 1730 03/16/19 2138 03/17/19 0413  TROPONINI 0.08* 0.08* 0.09* 0.09*   BNP (last 3 results) No results for input(s): PROBNP in the last 8760 hours. HbA1C: No results for input(s): HGBA1C in the last 72 hours. CBG: Recent Labs  Lab 03/19/19 1414 03/19/19 1614 03/19/19 2135 03/20/19 0611 03/20/19 1315  GLUCAP 134* 226* 139* 166* 211*   Lipid Profile: No results for input(s): CHOL, HDL, LDLCALC, TRIG, CHOLHDL, LDLDIRECT in the last 72  hours. Thyroid Function Tests: No results for input(s): TSH, T4TOTAL, FREET4, T3FREE, THYROIDAB in the last 72 hours. Anemia Panel: No results for input(s): VITAMINB12, FOLATE, FERRITIN, TIBC, IRON, RETICCTPCT in the last 72 hours. Sepsis Labs: No results for input(s): PROCALCITON, LATICACIDVEN in the last 168 hours.  Recent Results (from the past 240 hour(s))  SARS Coronavirus 2 (CEPHEID - Performed in [place] hospital lab), Hosp Order     Status: None   Collection Time: 03/16/19  3:00 PM   Specimen: Nasopharyngeal Swab  Result Value Ref Range Status   SARS Coronavirus 2 NEGATIVE NEGATIVE Final    Comment: (NOTE) If result is NEGATIVE SARS-CoV-2 target nucleic acids are NOT DETECTED. The SARS-CoV-2 RNA is generally detectable in upper and lower  respiratory specimens during the acute phase of infection. The lowest  concentration of SARS-CoV-2 viral copies this assay can detect is 250  copies / mL. A negative result does not preclude SARS-CoV-2 infection  and should not be used as the sole basis for treatment or other  patient management decisions.  A negative result may occur with  improper specimen collection / handling, submission of specimen other  than nasopharyngeal swab, presence of viral mutation(s) within the  areas targeted by this assay, and inadequate number of viral copies  (<250 copies / mL). A negative result must be combined with clinical  observations, patient history, and epidemiological information. If result is POSITIVE SARS-CoV-2 target nucleic acids are DETECTED. The SARS-CoV-2 RNA is generally detectable in upper and lower  respiratory specimens dur ing the acute phase of infection.  Positive  results are indicative of active infection with SARS-CoV-2.  Clinical  correlation with patient history and other diagnostic information is  necessary to determine patient infection status.  Positive results do  not rule out bacterial infection or co-infection with  other viruses. If result is PRESUMPTIVE POSTIVE SARS-CoV-2 nucleic acids MAY BE PRESENT.   A presumptive positive result was obtained on the submitted specimen  and confirmed on repeat testing.  While 2019 novel coronavirus  (SARS-CoV-2) nucleic acids may be present in the submitted sample  additional confirmatory testing may be necessary for epidemiological  and / or clinical management purposes  to differentiate between  SARS-CoV-2 and other Sarbecovirus currently known to infect humans.  If clinically indicated additional testing with an alternate test  methodology [phone]) is advised. The SARS-CoV-2 RNA is generally  detectable in upper and lower respiratory sp ecimens during the acute  phase of infection. The expected result is Negative. Fact Sheet for Patients:  StrictlyIdeas.no Fact Sheet for Healthcare Providers: BankingDealers.co.za This test is not yet approved or cleared by the [place] FDA and has been authorized for detection and/or diagnosis of SARS-CoV-2 by FDA under an Emergency Use Authorization (EUA).  This EUA will remain in effect (meaning this test can be used) for the  duration of the COVID-19 declaration under Section 564(b)(1) of the Act, 21 U.S.C. section 360bbb-3(b)(1), unless the authorization is terminated or revoked sooner. Performed at [facility], [place] [address], [place], [place] [address]       Radiology Studies: Dg Esophagus W Single Cm (sol Or Thin Ba)  Result Date: 03/19/2019 CLINICAL DATA:  Idiopathic dysphagia. Food sticking in chest. EXAM: ESOPHOGRAM/BARIUM SWALLOW TECHNIQUE: Single contrast examination was performed using thin barium and barium tablet. FLUOROSCOPY TIME:  Fluoroscopy Time:  2 minutes 24 seconds Radiation Exposure Index (if provided by the fluoroscopic device): 33.0 mGy Number of Acquired Spot Images: 0 COMPARISON:  None. FINDINGS: Single-contrast study shows no evidence  of esophageal mass or stricture. Moderate esophageal dysmotility is seen with break up of primary peristalsis in the upper thoracic esophagus causing esophageal stasis. A 13 mm barium tablet stated in the lower esophagus due to esophageal stasis. No evidence of hiatal hernia. No gastroesophageal reflux seen during the exam. IMPRESSION: 1. Moderate esophageal dysmotility. 2. No evidence of esophageal stricture or hiatal hernia. Electronically Signed   By: Earle Gell M.D.   On: 03/19/2019 13:54        Scheduled Meds:  allopurinol  100 mg Oral BH-q7a   carvedilol  6.25 mg Oral BID WC   clopidogrel  75 mg Oral Daily   heparin  5,000 Units Subcutaneous Q8H   insulin aspart  0-15 Units Subcutaneous TID WC   insulin aspart protamine- aspart  20 Units Subcutaneous BID WC   isosorbide-hydrALAZINE  0.5 tablet Oral TID   levothyroxine  150 mcg Oral QAC breakfast   mirabegron ER  50 mg Oral Daily   mometasone-formoterol  2 puff Inhalation BID   pantoprazole  40 mg Oral Daily   simvastatin  20 mg Oral QHS   sodium chloride flush  3 mL Intravenous Q12H   sodium chloride flush  3 mL Intravenous Q12H   tiZANidine  4 mg Oral BID   Continuous Infusions:  sodium chloride      Assessment & Plan:    1.  Acute on chronic combined systolic/diastolic CHF exacerbation: BNP on admission 196.  Repeat echo this admission shows EF 40 to 45% with impaired LV relaxation (previously 45 to 50%).  Patient reported 9 pound weight gain with lower extremity swelling.  Weight down from 259 pounds on admission to 254 pounds now.  Leg swellings much improved.  Saturating well on room air.  Given rising creatinine, diuretics held and patient underwent cardiac cath which did not show evidence of significant volume overload (official report pending) and might have been over diuresed.  Patient now started on fluids by cardiology. Continue Coreg, nitrates and hydralazine.  2.  Chronic kidney disease stage IV:  Patient's GFR has fluctuated between 20 to 30 in 2016-2017 and earlier this year fluctuated mostly between 15-20.  Patient serum creatinine has been chronically elevated ranging between 1.9 to 3.3.  Patient's creatinine was 2.0 on presentation this time, stable last couple of days but abruptly jumped to 2.8 on 6/11  prompting hold on diuresis by cardiology.  Status post cardiac cath today and now on IV fluids.  Patient apparently follows Dr. Danie Chandler with [facility].  Will consult in a.m. if creatinine worsens.  3.  Dysphagia: Upper GI series today revealed thoracic esophageal dysmotility which explains her symptoms.  Will avoid Reglan given concern for prolonged QTC on prior EKG. Seen by GI who recommended adequate chewing (which patient in says she does  not also reports dysphagia to liquids) and agreed with PPI. Soft diet.   4.  COPD/tobacco use: Patient reports being diagnosed with COPD but continues to smoke.  She is saturating well on room air currently.  No active wheezing on exam.  Nebs PRN (she did require one treatment last night), patient counseled to quit smoking and advised of fatal risks.  5. Hypertension/hyperlipidemia: Resume home medications  6.  Diabetes mellitus: On 70/30 insulin 20 units twice daily.  Watch for hypoglycemia with fluctuating renal function.  7.  History of TIA: On Plavix/statins.  8.  Hypothyroidism: Synthroid   DVT prophylaxis: Heparin Code Status: Full code Family / Patient Communication: Discussed with patient and all questions answered Disposition Plan: Home when medically cleared likely in next 48 to 72 hours     LOS: 4 days    Time spent: 35 minutes    Guilford Shi, MD [facility] Pager [phone]  If 7PM-7AM, please contact night-coverage [email or web address] Password [facility] 03/20/2019, 4:24 PM

## 2019-03-21 LAB — CBC
HCT: 33.6 % — ABNORMAL LOW (ref 36.0–46.0)
Hemoglobin: 10.5 g/dL — ABNORMAL LOW (ref 12.0–15.0)
MCH: 26.6 pg (ref 26.0–34.0)
MCHC: 31.3 g/dL (ref 30.0–36.0)
MCV: 85.3 fL (ref 80.0–100.0)
Platelets: 302 10*3/uL (ref 150–400)
RBC: 3.94 MIL/uL (ref 3.87–5.11)
RDW: 15.2 % (ref 11.5–15.5)
WBC: 10.4 10*3/uL (ref 4.0–10.5)
nRBC: 0 % (ref 0.0–0.2)

## 2019-03-21 LAB — BASIC METABOLIC PANEL
Anion gap: 11 (ref 5–15)
BUN: 90 mg/dL — ABNORMAL HIGH (ref 8–23)
CO2: 29 mmol/L (ref 22–32)
Calcium: 10.3 mg/dL (ref 8.9–10.3)
Chloride: 99 mmol/L (ref 98–111)
Creatinine, Ser: 2.34 mg/dL — ABNORMAL HIGH (ref 0.44–1.00)
GFR calc Af Amer: 24 mL/min — ABNORMAL LOW (ref >60)
GFR calc non Af Amer: 21 mL/min — ABNORMAL LOW (ref >60)
Glucose, Bld: 105 mg/dL — ABNORMAL HIGH (ref 70–99)
Potassium: 4.4 mmol/L (ref 3.5–5.1)
Sodium: 139 mmol/L (ref 135–145)

## 2019-03-21 LAB — GLUCOSE, CAPILLARY
Glucose-Capillary: 88 mg/dL (ref 70–99)
Glucose-Capillary: 202 mg/dL — ABNORMAL HIGH (ref 70–99)

## 2019-03-21 MED ORDER — ISOSORB DINITRATE-HYDRALAZINE 20-37.5 MG PO TABS: 0.5000 | ORAL_TABLET | Freq: Three times a day (TID) | ORAL | 2 refills | Status: DC

## 2019-03-21 MED ORDER — PANTOPRAZOLE SODIUM 40 MG PO TBEC: 40.0000 mg | DELAYED_RELEASE_TABLET | Freq: Every day | ORAL | 1 refills | Status: DC

## 2019-03-21 NOTE — Discharge Summary (Signed)
Physician Discharge Summary  [name] [name] YWV:[id number] DOB: [birth year] DOA: 03/16/2019  PCP: [name], MD  Admit date: 03/16/2019 Discharge date: 03/21/2019 Consultations:  Admitted From: home Disposition:  home  Discharge Diagnoses:  Principal Problem:   CHF (congestive heart failure), NYHA class III, acute on chronic, systolic (HCC) Active Problems:   Tobacco abuse   DM (diabetes mellitus) ([place])   CKD (chronic kidney disease), stage IV (HCC)   HTN (hypertension)   Hypothyroidism   Leukocytosis   Brief/Interim Summary: 68 y/o female with h/o systolic CHF EF 85-46%, DM type II, CKDstage III,COPD, obesity, OSA,andhypothyroidism; admitted on6/05/2019, presented with complaint of shortnes of breath , was found to have acute on chronic CHF. Patient seen by cardiology and been receiving IV diuresis through Lasix gtt and Metolazone oral. She also has elevated troponin but felt to be secondary to demand ischemia. During the hospital course patient reported trouble with expiration > inspiration. She also feels she has to catch her breath while swallowing as she feels her food is stuck at times in middle of chest. Is an active smoker but denies any wheezing.  She underwent barium swallow which revealed esophageal dysmotility and GI was consulted.  1.  Acute on chronic combined systolic/diastolic CHF exacerbation: BNP on admission 196.  Repeat echo this admission shows EF 40 to 45% with impaired LV relaxation (previously 45 to 50%).  Patient reported 9 pound weight gain with lower extremity swelling.  Weight down from 259 pounds on admission to 254 pounds now.  Leg swellings much improved.  Saturating well on room air.  Given rising creatinine, diuretics held and patient underwent Right heart cardiac cath which did not show evidence of significant volume overload (mean PA 17, PCWP 8, CI 2.6. Overall low filling pressures) and might have been over diuresed.  Patient received IV fluids  overnight with some improvement in creatinine. Seen by cardiology for follow up today and cleared for discharge with instructions to resume home diuretics tomorrow. Continue Coreg and Bidil on discharge  2.  Chronic kidney disease stage IV: Patient's GFR has fluctuated between 20 to 30 in 2016-2017 and earlier this year fluctuated mostly between 15-20.  Patient serum creatinine has been chronically elevated ranging between 1.9 to 3.3.  Patient's creatinine was 2.0 on presentation this time, stable last couple of days but abruptly jumped to 2.8 on 6/11  prompting hold on diuresis by cardiology.  After [facility] cath revealing overall low filling pressures, patient received IV fluids. Her labs today show BUN 90( baseline 70-80) and creatinine at 2.4.Seen by cardiology for follow up today and cleared for discharge with instructions to resume home diuretics tomorrow. Patient apparently follows Dr. Danie Chandler with [facility] and is advised to follow up next week to recommend further.  3.  Dysphagia: Upper GI series today revealed thoracic esophageal dysmotility which explains her symptoms.  Will avoid Reglan given concern for prolonged QTC on prior EKG. Seen by GI who recommended adequate chewing (which patient in says she does not also reports dysphagia to liquids) and agreed with PPI. Soft diet.   4.  COPD/tobacco use: Patient reports being diagnosed with COPD but continues to smoke.  She is saturating well on room air currently.  No active wheezing on exam.  Nebs PRN (she did require one treatment last night), patient counseled to quit smoking and advised of fatal risks.  5. Hypertension/hyperlipidemia: Resume home medications  6.  Diabetes mellitus: patient been on On 70/30 insulin 20 units twice daily  while here but takes much higher doses of insulin at home, likely diet non compliant. Patient advised to watch for hypoglycemia and titrate insulin dosage as needed at home given fluctuating renal  function and dysphagia limiting oral intake.   7.  History of TIA: On Plavix/statins.  8.  Hypothyroidism: Synthroid    Discharge Exam: Vitals:   03/21/19 0741 03/21/19 1126  BP: (!) 117/95 136/70  Pulse: 74 73  Resp: 20 18  Temp: 97.6 F (36.4 C) 97.9 F (36.6 C)  SpO2: 97% 99%   Vitals:   03/21/19 0437 03/21/19 0719 03/21/19 0741 03/21/19 1126  BP: (!) 116/58  (!) 117/95 136/70  Pulse: 76  74 73  Resp: 18  20 18   Temp: 98 F (36.7 C)  97.6 F (36.4 C) 97.9 F (36.6 C)  TempSrc: Oral  Oral Oral  SpO2: 96% 98% 97% 99%  Weight:      Height:        General: Pt is alert, awake, not in acute distress Cardiovascular: RRR, S1/S2 +, no rubs, no gallops Respiratory: CTA bilaterally, no wheezing, no rhonchi Abdominal: Soft, NT, ND, bowel sounds + Extremities: much improved edema/resolved, no cyanosis  Discharge Instructions  Discharge Instructions    (HEART FAILURE PATIENTS) Call MD:  Anytime you have any of the following symptoms: 1) 3 pound weight gain in 24 hours or 5 pounds in 1 week 2) shortness of breath, with or without a dry hacking cough 3) swelling in the hands, feet or stomach 4) if you have to sleep on extra pillows at night in order to breathe.   Complete by: As directed    Call MD for:  difficulty breathing, headache or visual disturbances   Complete by: As directed    Call MD for:  persistant dizziness or light-headedness   Complete by: As directed    Diet - low sodium heart healthy   Complete by: As directed    Increase activity slowly   Complete by: As directed      Allergies as of 03/21/2019      Reactions   Nebivolol Swelling   Ace Inhibitors Swelling, Other (See Comments)   Tongue swell   Bystolic [nebivolol Hcl] Swelling, Other (See Comments)   Chest pain    Morphine And Related Itching      Medication List    TAKE these medications   albuterol (2.5 MG/3ML) 0.083% nebulizer solution Commonly known as: PROVENTIL Take 2.5 mg by  nebulization every 6 (six) hours as needed for wheezing or shortness of breath.   ProAir HFA 108 (90 Base) MCG/ACT inhaler Generic drug: albuterol Inhale 2 puffs into the lungs 4 (four) times daily as needed for wheezing or shortness of breath.   allopurinol 100 MG tablet Commonly known as: ZYLOPRIM Take 100 mg by mouth every morning.   budesonide-formoterol 160-4.5 MCG/ACT inhaler Commonly known as: SYMBICORT Inhale 2 puffs into the lungs 2 (two) times daily as needed (for SOB).   carvedilol 6.25 MG tablet Commonly known as: COREG TAKE 1 TABLET BY MOUTH TWICE DAILY, WITH A MEAL What changed: See the new instructions.   cetirizine 10 MG tablet Commonly known as: ZYRTEC Take 10 mg by mouth daily as needed for allergies.   clopidogrel 75 MG tablet Commonly known as: PLAVIX TAKE 1 TABLET BY MOUTH EVERY DAY   Dulaglutide 1.5 MG/0.5ML Sopn Inject 1.5 mg into the skin every Monday.   fenofibrate 145 MG tablet Commonly known as: TRICOR TAKE 1 TABLET (145  MG TOTAL) BY MOUTH DAILY. What changed: See the new instructions.   fluticasone 50 MCG/ACT nasal spray Commonly known as: FLONASE Place 2 sprays into the nose as needed for allergies.   hydrOXYzine 25 MG tablet Commonly known as: ATARAX/VISTARIL Take 25 mg by mouth 2 (two) times daily as needed for itching.   insulin lispro protamine-lispro (75-25) 100 UNIT/ML Susp injection Commonly known as: HUMALOG 75/25 MIX Inject 35-40 Units into the skin See admin instructions. 40units in the morning and 35units in the evening   isosorbide-hydrALAZINE 20-37.5 MG tablet Commonly known as: BIDIL Take 0.5 tablets by mouth 3 (three) times daily.   levothyroxine 150 MCG tablet Commonly known as: SYNTHROID Take 150 mcg by mouth daily before breakfast.   multivitamin with minerals Tabs tablet Take 1 tablet by mouth daily.   Myrbetriq 50 MG Tb24 tablet Generic drug: mirabegron ER Take 50 mg by mouth daily.    oxyCODONE-acetaminophen 10-325 MG tablet Commonly known as: PERCOCET Take 1 tablet by mouth every 4 (four) hours as needed for pain.   pantoprazole 40 MG tablet Commonly known as: PROTONIX Take 1 tablet (40 mg total) by mouth daily. Start taking on: March 22, 2019   polyvinyl alcohol 1.4 % ophthalmic solution Commonly known as: LIQUIFILM TEARS Place 2 drops into both eyes daily as needed for dry eyes.   simvastatin 20 MG tablet Commonly known as: ZOCOR Take 20 mg by mouth at bedtime.   tiZANidine 4 MG capsule Commonly known as: ZANAFLEX Take 4 mg by mouth 2 (two) times daily.   torsemide 20 MG tablet Commonly known as: DEMADEX Take 4 tablets (80 mg total) by mouth every morning AND 2 tablets (40 mg total) every evening.   VITAMIN B COMPLEX PO Take 1 tablet by mouth 2 (two) times daily.       Allergies  Allergen Reactions  . Nebivolol Swelling  . Ace Inhibitors Swelling and Other (See Comments)    Tongue swell  . Bystolic [Nebivolol Hcl] Swelling and Other (See Comments)    Chest pain   . Morphine And Related Itching       Discharge Condition: stable CODE STATUS: Full code Diet recommendation: low salt diabetic diet Recommendations for Outpatient Follow-up:  1. Follow up with PCP in 1 week 2. Please obtain BMP/CBC in one week 3. Please follow up with primary cardiologist and nephrologist       The results of significant diagnostics from this hospitalization (including imaging, microbiology, ancillary and laboratory) are listed below for reference.     Microbiology: Recent Results (from the past 240 hour(s))  SARS Coronavirus 2 (CEPHEID - Performed in [place] hospital lab), Hosp Order     Status: None   Collection Time: 03/16/19  3:00 PM   Specimen: Nasopharyngeal Swab  Result Value Ref Range Status   SARS Coronavirus 2 NEGATIVE NEGATIVE Final    Comment: (NOTE) If result is NEGATIVE SARS-CoV-2 target nucleic acids are NOT DETECTED. The  SARS-CoV-2 RNA is generally detectable in upper and lower  respiratory specimens during the acute phase of infection. The lowest  concentration of SARS-CoV-2 viral copies this assay can detect is 250  copies / mL. A negative result does not preclude SARS-CoV-2 infection  and should not be used as the sole basis for treatment or other  patient management decisions.  A negative result may occur with  improper specimen collection / handling, submission of specimen other  than nasopharyngeal swab, presence of viral mutation(s) within the  areas  targeted by this assay, and inadequate number of viral copies  (<250 copies / mL). A negative result must be combined with clinical  observations, patient history, and epidemiological information. If result is POSITIVE SARS-CoV-2 target nucleic acids are DETECTED. The SARS-CoV-2 RNA is generally detectable in upper and lower  respiratory specimens dur ing the acute phase of infection.  Positive  results are indicative of active infection with SARS-CoV-2.  Clinical  correlation with patient history and other diagnostic information is  necessary to determine patient infection status.  Positive results do  not rule out bacterial infection or co-infection with other viruses. If result is PRESUMPTIVE POSTIVE SARS-CoV-2 nucleic acids MAY BE PRESENT.   A presumptive positive result was obtained on the submitted specimen  and confirmed on repeat testing.  While 2019 novel coronavirus  (SARS-CoV-2) nucleic acids may be present in the submitted sample  additional confirmatory testing may be necessary for epidemiological  and / or clinical management purposes  to differentiate between  SARS-CoV-2 and other Sarbecovirus currently known to infect humans.  If clinically indicated additional testing with an alternate test  methodology [phone]) is advised. The SARS-CoV-2 RNA is generally  detectable in upper and lower respiratory sp ecimens during the acute   phase of infection. The expected result is Negative. Fact Sheet for Patients:  StrictlyIdeas.no Fact Sheet for Healthcare Providers: BankingDealers.co.za This test is not yet approved or cleared by the [place] FDA and has been authorized for detection and/or diagnosis of SARS-CoV-2 by FDA under an Emergency Use Authorization (EUA).  This EUA will remain in effect (meaning this test can be used) for the duration of the COVID-19 declaration under Section 564(b)(1) of the Act, 21 U.S.C. section 360bbb-3(b)(1), unless the authorization is terminated or revoked sooner. Performed at [facility], [place] [address], [place], [place] [address]      Labs: BNP (last 3 results) Recent Labs    10/20/18 0853 11/14/18 0936 03/16/19 1341  BNP 500.3* 206.8* 878.6*   Basic Metabolic Panel: Recent Labs  Lab 03/17/19 0413 03/18/19 0314 03/19/19 0525 03/20/19 0538 03/20/19 1103 03/21/19 0455  NA 140 140 139 137 138  138 139  K 3.8 4.0 4.1 4.0 3.9  4.0 4.4  CL 99 99 94* 96*  --  99  CO2 26 32 32 30  --  29  GLUCOSE 172* 160* 141* 168*  --  105*  BUN 76* 72* 80* 92*  --  90*  CREATININE 2.13* 1.96* 2.80* 2.77*  --  2.34*  CALCIUM 10.2 10.1 10.2 10.2  --  10.3  MG  --  2.6* 2.7*  --   --   --    Liver Function Tests: No results for input(s): AST, ALT, ALKPHOS, BILITOT, PROT, ALBUMIN in the last 168 hours. No results for input(s): LIPASE, AMYLASE in the last 168 hours. No results for input(s): AMMONIA in the last 168 hours. CBC: Recent Labs  Lab 03/16/19 1341 03/17/19 0413 03/18/19 0314 03/19/19 0525 03/20/19 0538 03/20/19 1103 03/21/19 0455  WBC 11.7* 12.7* 10.6* 11.0* 12.1*  --  10.4  NEUTROABS 9.0* 9.6*  --   --   --   --   --   HGB 12.1 11.6* 11.1* 10.9* 10.7* 11.9*  11.9* 10.5*  HCT 38.4 36.9 35.5* 35.3* 34.6* 35.0*  35.0* 33.6*  MCV 85.0 84.2 85.1 85.1 84.8  --  85.3  PLT 338 341 326 327 346  --  302    Cardiac Enzymes: Recent Labs  Lab 03/16/19 1341 03/16/19 1730 03/16/19 2138 03/17/19 0413  TROPONINI 0.08* 0.08* 0.09* 0.09*   BNP: Invalid input(s): POCBNP CBG: Recent Labs  Lab 03/20/19 1315 03/20/19 1659 03/20/19 2112 03/21/19 0611 03/21/19 1124  GLUCAP 211* 179* 178* 88 202*   D-Dimer No results for input(s): DDIMER in the last 72 hours. Hgb A1c No results for input(s): HGBA1C in the last 72 hours. Lipid Profile No results for input(s): CHOL, HDL, LDLCALC, TRIG, CHOLHDL, LDLDIRECT in the last 72 hours. Thyroid function studies No results for input(s): TSH, T4TOTAL, T3FREE, THYROIDAB in the last 72 hours.  Invalid input(s): FREET3 Anemia work up No results for input(s): VITAMINB12, FOLATE, FERRITIN, TIBC, IRON, RETICCTPCT in the last 72 hours. Urinalysis    Component Value Date/Time   COLORURINE YELLOW 11/27/2018 1523   APPEARANCEUR CLEAR 11/27/2018 1523   LABSPEC 1.008 11/27/2018 1523   PHURINE 5.0 11/27/2018 1523   GLUCOSEU NEGATIVE 11/27/2018 1523   HGBUR NEGATIVE 11/27/2018 1523   BILIRUBINUR NEGATIVE 11/27/2018 1523   KETONESUR NEGATIVE 11/27/2018 1523   PROTEINUR NEGATIVE 11/27/2018 1523   UROBILINOGEN 0.2 08/04/2014 1409   NITRITE NEGATIVE 11/27/2018 1523   LEUKOCYTESUR NEGATIVE 11/27/2018 1523   Sepsis Labs Invalid input(s): PROCALCITONIN,  WBC,  LACTICIDVEN Microbiology Recent Results (from the past 240 hour(s))  SARS Coronavirus 2 (CEPHEID - Performed in [place] hospital lab), Hosp Order     Status: None   Collection Time: 03/16/19  3:00 PM   Specimen: Nasopharyngeal Swab  Result Value Ref Range Status   SARS Coronavirus 2 NEGATIVE NEGATIVE Final    Comment: (NOTE) If result is NEGATIVE SARS-CoV-2 target nucleic acids are NOT DETECTED. The SARS-CoV-2 RNA is generally detectable in upper and lower  respiratory specimens during the acute phase of infection. The lowest  concentration of SARS-CoV-2 viral copies this assay can detect is  250  copies / mL. A negative result does not preclude SARS-CoV-2 infection  and should not be used as the sole basis for treatment or other  patient management decisions.  A negative result may occur with  improper specimen collection / handling, submission of specimen other  than nasopharyngeal swab, presence of viral mutation(s) within the  areas targeted by this assay, and inadequate number of viral copies  (<250 copies / mL). A negative result must be combined with clinical  observations, patient history, and epidemiological information. If result is POSITIVE SARS-CoV-2 target nucleic acids are DETECTED. The SARS-CoV-2 RNA is generally detectable in upper and lower  respiratory specimens dur ing the acute phase of infection.  Positive  results are indicative of active infection with SARS-CoV-2.  Clinical  correlation with patient history and other diagnostic information is  necessary to determine patient infection status.  Positive results do  not rule out bacterial infection or co-infection with other viruses. If result is PRESUMPTIVE POSTIVE SARS-CoV-2 nucleic acids MAY BE PRESENT.   A presumptive positive result was obtained on the submitted specimen  and confirmed on repeat testing.  While 2019 novel coronavirus  (SARS-CoV-2) nucleic acids may be present in the submitted sample  additional confirmatory testing may be necessary for epidemiological  and / or clinical management purposes  to differentiate between  SARS-CoV-2 and other Sarbecovirus currently known to infect humans.  If clinically indicated additional testing with an alternate test  methodology [phone]) is advised. The SARS-CoV-2 RNA is generally  detectable in upper and lower respiratory sp ecimens during the acute  phase of infection. The expected result is Negative. Fact Sheet  for Patients:  StrictlyIdeas.no Fact Sheet for Healthcare  Providers: BankingDealers.co.za This test is not yet approved or cleared by the [place] FDA and has been authorized for detection and/or diagnosis of SARS-CoV-2 by FDA under an Emergency Use Authorization (EUA).  This EUA will remain in effect (meaning this test can be used) for the duration of the COVID-19 declaration under Section 564(b)(1) of the Act, 21 U.S.C. section 360bbb-3(b)(1), unless the authorization is terminated or revoked sooner. Performed at [facility], [place] [address], [place], [place] [address]     Procedures/Studies: Dg Chest 2 View  Result Date: 03/16/2019 CLINICAL DATA:  Shortness of breath with exertion. Leg swelling. History of CHF. EXAM: CHEST - 2 VIEW COMPARISON:  CT chest dated January 23, 2019. Chest x-ray dated November 27, 2018. FINDINGS: Stable cardiomegaly. New pulmonary vascular congestion and mild interstitial thickening. No focal consolidation, pleural effusion, or pneumothorax. No acute osseous abnormality. IMPRESSION: 1. New mild interstitial pulmonary edema. Electronically Signed   By: Titus Dubin M.D.   On: 03/16/2019 13:05   Ct Head Wo Contrast  Result Date: 03/16/2019 CLINICAL DATA:  Headache. EXAM: CT HEAD WITHOUT CONTRAST TECHNIQUE: Contiguous axial images were obtained from the base of the skull through the vertex without intravenous contrast. COMPARISON:  None. FINDINGS: Brain: Mild chronic ischemic white matter disease is noted. No mass effect or midline shift is noted. Ventricular size is within normal limits. There is no evidence of mass lesion, hemorrhage or acute infarction. Vascular: No hyperdense vessel or unexpected calcification. Skull: Normal. Negative for fracture or focal lesion. Sinuses/Orbits: No acute finding. Other: None. IMPRESSION: Mild chronic ischemic white matter disease. No acute intracranial abnormality seen. Electronically Signed   By: Marijo Conception M.D.   On: 03/16/2019 15:04   Dg Esophagus  W Single Cm (sol Or Thin Ba)  Result Date: 03/19/2019 CLINICAL DATA:  Idiopathic dysphagia. Food sticking in chest. EXAM: ESOPHOGRAM/BARIUM SWALLOW TECHNIQUE: Single contrast examination was performed using thin barium and barium tablet. FLUOROSCOPY TIME:  Fluoroscopy Time:  2 minutes 24 seconds Radiation Exposure Index (if provided by the fluoroscopic device): 33.0 mGy Number of Acquired Spot Images: 0 COMPARISON:  None. FINDINGS: Single-contrast study shows no evidence of esophageal mass or stricture. Moderate esophageal dysmotility is seen with break up of primary peristalsis in the upper thoracic esophagus causing esophageal stasis. A 13 mm barium tablet stated in the lower esophagus due to esophageal stasis. No evidence of hiatal hernia. No gastroesophageal reflux seen during the exam. IMPRESSION: 1. Moderate esophageal dysmotility. 2. No evidence of esophageal stricture or hiatal hernia. Electronically Signed   By: Earle Gell M.D.   On: 03/19/2019 13:54    (Echo, Carotid, EGD, Colonoscopy, ERCP)  Time coordinating discharge: Over 30 minutes  SIGNED:   Guilford Shi, MD  [facility] 03/21/2019, 12:18 PM Pager   If 7PM-7AM, please contact night-coverage [email or web address] Password [email or web address]

## 2019-03-21 NOTE — Progress Notes (Signed)
Progress Note  Patient Name: [name] [name] Date of Encounter: 03/21/2019  Primary Cardiologist: Marigene Ehlers  Subjective   SOB has improved  Inpatient Medications    Scheduled Meds: . allopurinol  100 mg Oral BH-q7a  . carvedilol  6.25 mg Oral BID WC  . clopidogrel  75 mg Oral Daily  . heparin  5,000 Units Subcutaneous Q8H  . insulin aspart  0-15 Units Subcutaneous TID WC  . insulin aspart protamine- aspart  20 Units Subcutaneous BID WC  . isosorbide-hydrALAZINE  0.5 tablet Oral TID  . levothyroxine  150 mcg Oral QAC breakfast  . mirabegron ER  50 mg Oral Daily  . mometasone-formoterol  2 puff Inhalation BID  . pantoprazole  40 mg Oral Daily  . simvastatin  20 mg Oral QHS  . sodium chloride flush  3 mL Intravenous Q12H  . sodium chloride flush  3 mL Intravenous Q12H  . tiZANidine  4 mg Oral BID   Continuous Infusions: . sodium chloride     PRN Meds: sodium chloride, acetaminophen, albuterol, fluticasone, hydrOXYzine, loratadine, ondansetron (ZOFRAN) IV, oxyCODONE-acetaminophen **AND** oxyCODONE, polyvinyl alcohol, sodium chloride flush   Vital Signs    Vitals:   03/21/19 0215 03/21/19 0437 03/21/19 0719 03/21/19 0741  BP:  (!) 116/58  (!) 117/95  Pulse:  76  74  Resp:  18  20  Temp:  98 F (36.7 C)  97.6 F (36.4 C)  TempSrc:  Oral  Oral  SpO2:  96% 98% 97%  Weight: 116.8 kg     Height:        Intake/Output Summary (Last 24 hours) at 03/21/2019 1100 Last data filed at 03/21/2019 0900 Gross per 24 hour  Intake 480 ml  Output 1450 ml  Net -970 ml   Last 3 Weights 03/21/2019 03/20/2019 03/19/2019  Weight (lbs) 257 lb 8 oz 255 lb 254 lb 6.4 oz  Weight (kg) 116.801 kg 115.667 kg 115.395 kg      Telemetry    SR- Personally Reviewed  ECG    na - Personally Reviewed  Physical Exam   GEN: No acute distress.   Neck: No JVD Cardiac: RRR, no murmurs, rubs, or gallops.  Respiratory: Clear to auscultation bilaterally. GI: Soft, nontender, non-distended   MS: trace bialteral pitting edema. Bilateral lymphedematous swelling in both feet Neuro:  Nonfocal  Psych: Normal affect   Labs    Chemistry Recent Labs  Lab 03/19/19 0525 03/20/19 0538 03/20/19 1103 03/21/19 0455  NA 139 137 138  138 139  K 4.1 4.0 3.9  4.0 4.4  CL 94* 96*  --  99  CO2 32 30  --  29  GLUCOSE 141* 168*  --  105*  BUN 80* 92*  --  90*  CREATININE 2.80* 2.77*  --  2.34*  CALCIUM 10.2 10.2  --  10.3  GFRNONAA 17* 17*  --  21*  GFRAA 19* 20*  --  24*  ANIONGAP 13 11  --  11     Hematology Recent Labs  Lab 03/19/19 0525 03/20/19 0538 03/20/19 1103 03/21/19 0455  WBC 11.0* 12.1*  --  10.4  RBC 4.15 4.08  --  3.94  HGB 10.9* 10.7* 11.9*  11.9* 10.5*  HCT 35.3* 34.6* 35.0*  35.0* 33.6*  MCV 85.1 84.8  --  85.3  MCH 26.3 26.2  --  26.6  MCHC 30.9 30.9  --  31.3  RDW 15.5 15.3  --  15.2  PLT 327 346  --  302    Cardiac Enzymes Recent Labs  Lab 03/16/19 1341 03/16/19 1730 03/16/19 2138 03/17/19 0413  TROPONINI 0.08* 0.08* 0.09* 0.09*   No results for input(s): TROPIPOC in the last 168 hours.   BNP Recent Labs  Lab 03/16/19 1341  BNP 196.4*     DDimer No results for input(s): DDIMER in the last 168 hours.   Radiology    Dg Esophagus W Single Cm (sol Or Thin Ba)  Result Date: 03/19/2019 CLINICAL DATA:  Idiopathic dysphagia. Food sticking in chest. EXAM: ESOPHOGRAM/BARIUM SWALLOW TECHNIQUE: Single contrast examination was performed using thin barium and barium tablet. FLUOROSCOPY TIME:  Fluoroscopy Time:  2 minutes 24 seconds Radiation Exposure Index (if provided by the fluoroscopic device): 33.0 mGy Number of Acquired Spot Images: 0 COMPARISON:  None. FINDINGS: Single-contrast study shows no evidence of esophageal mass or stricture. Moderate esophageal dysmotility is seen with break up of primary peristalsis in the upper thoracic esophagus causing esophageal stasis. A 13 mm barium tablet stated in the lower esophagus due to esophageal  stasis. No evidence of hiatal hernia. No gastroesophageal reflux seen during the exam. IMPRESSION: 1. Moderate esophageal dysmotility. 2. No evidence of esophageal stricture or hiatal hernia. Electronically Signed   By: Earle Gell M.D.   On: 03/19/2019 13:54    Cardiac Studies     Patient Profile     [name] [name] is a 68 y.o. female with a history of normal coronary arteries on cardiac catheterization in 06/1477, chronic diastolic CHF, PAD followed by Dr. Oneida Alar,  prior TIAs, hypertension, hyperlipidemia, diabetes mellitus, hypothyroidism, CKD stage IV followed by Dr. Florene Glen, morbid obesity, and tobacco use who is being seen today for the evaluation of CHF at the request of Dr. Tamala Julian.  Assessment & Plan    1. Acute on chronic systolic HF - 2/95/62 echo LVEF 13-08%, grade I diastolic dysfunction - had been on lasix gtt, stopped with elevation of Cr/BUN. Due to difficultly in evaluating volume status was referred for RHC - RHC this admit: mean PA 17, PCWP 8, CI 2.6. Overall low filling pressures  - was given gentle IVFs yesterday though not documented in I/Os, diuretics held.  - other medical therapy with coreg 6.25mg  bid, bidil 1/2 tablet tid. No ACE/ARB/ARNI/Aldactone due to renal dysfunction  - no signs of fluid overload. Would hold diuretics today, restart tomorrow torsemide 80mg  in am and 60mg  in PM.  - residual swelling in legs looks like lymphedema as opposed to CHF.   2. Chest pain - atypical, reproducible to palpation - normal coronaries by cath 2015. Mild trop elevation in setting of HF and renal dysfunction flat, not consistent with ACS  3. AKI on CKD IV - Recent baseline creatinine 2.2 to 3.0 - improved Cr with IVFs, down from 2.77 yesterday to 2.34 today.   4. Dysphagia: Patient feels like food getting stuck, associated with dyspnea + chest pain.  She had barium swallow showing esophageal dysmotility.  - PPI started, agree with no Reglan with somewhat prolonged QTc.      5. COPD - noted in primary team note, management per primary team   [facility] for discharge from cardiac standpoint  For questions or updates, please contact [facility] Please consult [email or web address] for contact info under        Signed, Carlyle Dolly, MD  03/21/2019, 11:00 AM

## 2019-03-21 NOTE — Progress Notes (Signed)
Discharge instructions given to patient she verbalized understanding. No distress noted patient reported calling for her ride home.

## 2019-03-21 NOTE — Care Management (Signed)
Spoke w [facility] on [address], Bidil has $0 coapy.

## 2019-03-21 NOTE — Plan of Care (Signed)
  Problem: Education: Goal: Knowledge of General Education information will improve Description: Including pain rating scale, medication(s)/side effects and non-pharmacologic comfort measures Outcome: Adequate for Discharge   

## 2019-03-21 NOTE — Progress Notes (Signed)
Patient refuses bed alarm.

## 2019-03-24 ENCOUNTER — Telehealth (HOSPITAL_COMMUNITY): Payer: Self-pay | Admitting: Licensed Clinical Social Worker

## 2019-03-24 NOTE — Telephone Encounter (Signed)
CSW contacted patient to follow up on [employer] application. CSW informed patient that application submitted, and delivery date of meals will be June 17th.  Patient grateful for the opportunity to continue with the [employer] program for ongoing heart healthy meals. Patient informed this program is temporary although hopeful to support patient through the summer. CSW will continue to monitor and be available as needed.  Jackie Eloy Fehl, LCSW, CCSW-MCS [phone] 

## 2019-03-27 ENCOUNTER — Telehealth (HOSPITAL_COMMUNITY): Payer: Self-pay

## 2019-03-27 NOTE — Telephone Encounter (Signed)
Pt reported taking bidil as prescribed. Pt states that she is having side effects of dizziness, lightheadedness, being foggy and having "weak eyes". Pt reports that she wants to stop but wanted to talk to [name] first.

## 2019-03-27 NOTE — Telephone Encounter (Signed)
Spoke to pt to relay the orders per Amy NP-C. Pt agreeable and verbalized understanding. Reminded pt of upcoming appointment.

## 2019-03-27 NOTE — Telephone Encounter (Signed)
Ok to stop bidil.   Please call.  Amy Clegg NP-C  10:41 AM

## 2019-04-08 ENCOUNTER — Encounter (HOSPITAL_COMMUNITY): Payer: Self-pay

## 2019-04-08 ENCOUNTER — Emergency Department (HOSPITAL_COMMUNITY): Payer: Medicare Other

## 2019-04-08 ENCOUNTER — Other Ambulatory Visit: Payer: Self-pay

## 2019-04-08 ENCOUNTER — Encounter (HOSPITAL_COMMUNITY): Payer: Self-pay | Admitting: Adult Health

## 2019-04-08 ENCOUNTER — Emergency Department (HOSPITAL_COMMUNITY)
Admission: EM | Admit: 2019-04-08 | Discharge: 2019-04-08 | Disposition: A | Discharge status: Home / Self Care | Payer: Medicare Other | Attending: Emergency Medicine | Admitting: Emergency Medicine

## 2019-04-08 ENCOUNTER — Ambulatory Visit (HOSPITAL_COMMUNITY)
Admission: RE | Admit: 2019-04-08 | Discharge: 2019-04-08 | Disposition: A | Discharge status: Home / Self Care | Payer: Medicare Other | Source: Ambulatory Visit | Attending: Cardiology | Admitting: Cardiology

## 2019-04-08 DIAGNOSIS — R05 Cough: Secondary | ICD-10-CM | POA: Insufficient documentation

## 2019-04-08 DIAGNOSIS — F17210 Nicotine dependence, cigarettes, uncomplicated: Secondary | ICD-10-CM | POA: Insufficient documentation

## 2019-04-08 DIAGNOSIS — Z7901 Long term (current) use of anticoagulants: Secondary | ICD-10-CM | POA: Insufficient documentation

## 2019-04-08 DIAGNOSIS — Z79899 Other long term (current) drug therapy: Secondary | ICD-10-CM | POA: Insufficient documentation

## 2019-04-08 DIAGNOSIS — R609 Edema, unspecified: Secondary | ICD-10-CM | POA: Insufficient documentation

## 2019-04-08 DIAGNOSIS — I509 Heart failure, unspecified: Secondary | ICD-10-CM

## 2019-04-08 DIAGNOSIS — R0789 Other chest pain: Secondary | ICD-10-CM | POA: Diagnosis not present

## 2019-04-08 DIAGNOSIS — I5022 Chronic systolic (congestive) heart failure: Secondary | ICD-10-CM | POA: Diagnosis not present

## 2019-04-08 DIAGNOSIS — I130 Hypertensive heart and chronic kidney disease with heart failure and stage 1 through stage 4 chronic kidney disease, or unspecified chronic kidney disease: Secondary | ICD-10-CM | POA: Diagnosis not present

## 2019-04-08 DIAGNOSIS — E1122 Type 2 diabetes mellitus with diabetic chronic kidney disease: Secondary | ICD-10-CM | POA: Diagnosis not present

## 2019-04-08 DIAGNOSIS — E039 Hypothyroidism, unspecified: Secondary | ICD-10-CM | POA: Insufficient documentation

## 2019-04-08 DIAGNOSIS — N184 Chronic kidney disease, stage 4 (severe): Secondary | ICD-10-CM | POA: Diagnosis not present

## 2019-04-08 DIAGNOSIS — Z20828 Contact with and (suspected) exposure to other viral communicable diseases: Secondary | ICD-10-CM | POA: Diagnosis not present

## 2019-04-08 DIAGNOSIS — R0602 Shortness of breath: Secondary | ICD-10-CM | POA: Diagnosis present

## 2019-04-08 DIAGNOSIS — Z20822 Contact with and (suspected) exposure to covid-19: Secondary | ICD-10-CM

## 2019-04-08 LAB — CBC WITH DIFFERENTIAL/PLATELET
Abs Immature Granulocytes: 0.06 10*3/uL (ref 0.00–0.07)
Basophils Absolute: 0 10*3/uL (ref 0.0–0.1)
Basophils Relative: 0 %
Eosinophils Absolute: 0.1 10*3/uL (ref 0.0–0.5)
Eosinophils Relative: 1 %
HCT: 39.8 % (ref 36.0–46.0)
Hemoglobin: 12.3 g/dL (ref 12.0–15.0)
Immature Granulocytes: 1 %
Lymphocytes Relative: 18 %
Lymphs Abs: 2.1 10*3/uL (ref 0.7–4.0)
MCH: 26.3 pg (ref 26.0–34.0)
MCHC: 30.9 g/dL (ref 30.0–36.0)
MCV: 85.2 fL (ref 80.0–100.0)
Monocytes Absolute: 0.6 10*3/uL (ref 0.1–1.0)
Monocytes Relative: 5 %
Neutro Abs: 9.2 10*3/uL — ABNORMAL HIGH (ref 1.7–7.7)
Neutrophils Relative %: 75 %
Platelets: 356 10*3/uL (ref 150–400)
RBC: 4.67 MIL/uL (ref 3.87–5.11)
RDW: 15.8 % — ABNORMAL HIGH (ref 11.5–15.5)
WBC: 12.1 10*3/uL — ABNORMAL HIGH (ref 4.0–10.5)
nRBC: 0 % (ref 0.0–0.2)

## 2019-04-08 LAB — BRAIN NATRIURETIC PEPTIDE: B Natriuretic Peptide: 294.4 pg/mL — ABNORMAL HIGH (ref 0.0–100.0)

## 2019-04-08 LAB — COMPREHENSIVE METABOLIC PANEL
ALT: 14 U/L (ref 0–44)
AST: 24 U/L (ref 15–41)
Albumin: 3.7 g/dL (ref 3.5–5.0)
Alkaline Phosphatase: 52 U/L (ref 38–126)
Anion gap: 11 (ref 5–15)
BUN: 64 mg/dL — ABNORMAL HIGH (ref 8–23)
CO2: 33 mmol/L — ABNORMAL HIGH (ref 22–32)
Calcium: 10.6 mg/dL — ABNORMAL HIGH (ref 8.9–10.3)
Chloride: 99 mmol/L (ref 98–111)
Creatinine, Ser: 2.2 mg/dL — ABNORMAL HIGH (ref 0.44–1.00)
GFR calc Af Amer: 26 mL/min — ABNORMAL LOW (ref >60)
GFR calc non Af Amer: 22 mL/min — ABNORMAL LOW (ref >60)
Glucose, Bld: 76 mg/dL (ref 70–99)
Potassium: 3.4 mmol/L — ABNORMAL LOW (ref 3.5–5.1)
Sodium: 143 mmol/L (ref 135–145)
Total Bilirubin: 0.5 mg/dL (ref 0.3–1.2)
Total Protein: 7.2 g/dL (ref 6.5–8.1)

## 2019-04-08 LAB — TROPONIN I (HIGH SENSITIVITY)
Troponin I (High Sensitivity): 93 ng/L — ABNORMAL HIGH (ref <18)
Troponin I (High Sensitivity): 88 ng/L — ABNORMAL HIGH (ref <18)

## 2019-04-08 LAB — URINALYSIS, ROUTINE W REFLEX MICROSCOPIC
Bilirubin Urine: NEGATIVE
Glucose, UA: NEGATIVE mg/dL
Hgb urine dipstick: NEGATIVE
Ketones, ur: NEGATIVE mg/dL
Leukocytes,Ua: NEGATIVE
Nitrite: NEGATIVE
Protein, ur: NEGATIVE mg/dL
Specific Gravity, Urine: 1.008 (ref 1.005–1.030)
pH: 5 (ref 5.0–8.0)

## 2019-04-08 LAB — MAGNESIUM: Magnesium: 2.1 mg/dL (ref 1.7–2.4)

## 2019-04-08 LAB — SARS CORONAVIRUS 2 BY RT PCR (HOSPITAL ORDER, PERFORMED IN ~~LOC~~ HOSPITAL LAB): SARS Coronavirus 2: NEGATIVE

## 2019-04-08 MED ORDER — FUROSEMIDE 10 MG/ML IJ SOLN
60.0000 mg | Freq: Once | INTRAMUSCULAR | Status: AC
  Administered 2019-04-08: 60 mg via INTRAVENOUS
  Filled 2019-04-08: qty 6

## 2019-04-08 MED ORDER — ASPIRIN 81 MG PO CHEW
324.0000 mg | CHEWABLE_TABLET | Freq: Once | ORAL | Status: AC
  Administered 2019-04-08: 324 mg via ORAL
  Filled 2019-04-08: qty 4

## 2019-04-08 NOTE — ED Triage Notes (Signed)
Pt arrives POV for eval of SOB onset this AM. Was recently hospitalized for same, hx of CHF. Pt sates SOB worse w/ exertion. Endorses cough overnight, states malaise and worsening fatigue. GCS 15 A&Ox4.

## 2019-04-08 NOTE — ED Notes (Signed)
Patient verbalizes understanding of discharge instructions. Opportunity for questioning and answers were provided. Armband removed by staff, pt discharged from ED via wheelchair w/ family

## 2019-04-08 NOTE — Discharge Instructions (Signed)
Continue taking your torsemide.  Follow-up with PCP/ cardiology for reevaluation.  Return to the ED for any new or worsening symptoms.

## 2019-04-08 NOTE — ED Provider Notes (Addendum)
[facility] EMERGENCY DEPARTMENT Provider Note   CSN: [id number] Arrival date & time: 04/08/19  1151  History   Chief Complaint Chest Pain, shortness of breath, COVID exposure  HPI [name] [name] is a 68 y.o. female with past medical history significant for CHF, CKD, chronic pain, retention, hyperlipidemia, sleep apnea, TIA who presents for evaluation of shortness of breath. Patient has multiple family members have tested positive for COVID losing her granddaughter who was recently living with her.  Patient states she is also 4-6 pounds up from her dry weight however was unable to tell me her dry weight.  States she has increased her nighttime furosemide to 4 pills instead of 2 however continues to have bilateral lower extremity swelling over the last day.  Patient states she went in for a routine visit with cardiology and was noted to have a low-grade temperature.  Given COVID exposure they sent her to the emergency department for evaluation.  States she did have an episode of chest pain this morning however no longer currently has chest pain. Chest pain nonpleuritic and nonexertional in nature.  No associated radiation, diaphoresis, nausea, vomiting. Does have history of sleep apnea however does not use a CPAP machine at night.  Admits to orthopnea and PND states this is persistent and constant.  This is not new.  Denies any new worsening symptoms..  Has felt generalized body aches and pains, nonproductive cough which started yesterday.  Has not taken anything for symptoms. Does not use home oxygen.  Recently hospitalized on 03/16/2023 CHF exacerbation.  History obtained from patient and past medical records. No interpretor was used.     HPI  Past Medical History:  Diagnosis Date  . Anemia   . Arthritis   . Asthma   . Bronchitis   . CHF (congestive heart failure) ([place])   . Chronic kidney disease   . Chronic kidney disease   . Chronic pain syndrome   . Cramping of  feet   . Diabetes mellitus   . Dyspnea   . GERD (gastroesophageal reflux disease)   . Gout   . Headache   . History of hip replacement, total   . History of transfusion   . Hx MRSA infection    abscess left groin  . Hx of cardiac cath 06/2014  . Hyperlipidemia   . Hypertension   . Hypothyroid   . 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 Morbidly obese ([place])   . Peripheral vascular disease ([place])   . Positive cardiac stress test 12/2013   anterior and lateral ischemia on Myoview  . Sleep apnea    UNABLE TO TOLERATE C PAP  . Stress incontinence   . Thyroid disease   . TIA (transient ischemic attack)     X2 NO RESIDUAL PROBLEMS    Patient Active Problem List   Diagnosis Date Noted  . Leukocytosis 03/16/2019  . Chest pain 11/27/2018  . Pre-syncope 11/27/2018  . Epigastric abdominal pain 11/27/2018  . Solitary pulmonary nodule 11/06/2018  . Hypertensive heart and renal disease, stage 1-4 or unspecified chronic kidney disease, with heart failure ([place]) 10/20/2018  . CHF (congestive heart failure), NYHA class III, acute on chronic, systolic ([place]) 62/37/6283  . 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 Cellulitis of abdominal wall 03/21/2015  . Acute on chronic renal failure ([place]) 03/21/2015  . DM (diabetes mellitus), type 2 with renal complications ([place]) 15/17/6160  . Abdominal wall  abscess 03/21/2015  . 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 Low urine output 08/11/2014  . Acute on chronic systolic CHF (congestive heart failure), NYHA class 3 ([place]) 08/11/2014  . Asthma, chronic 08/11/2014  . Hypothyroidism 08/11/2014  . OSA (obstructive sleep apnea) 08/11/2014  . Positive cardiac stress test 06/29/2014  . Hyperlipidemia 02/12/2014  . Left arm weakness 12/23/2013  . TIA (transient ischemic attack) 12/23/2013  . Left buttock abscess 10/12/2013  . Cellulitis and abscess of leg, right 04/16/2013  . Acute  blood loss anemia 02/14/2013  . HTN (hypertension) 02/14/2013  . Cellulitis and abscess of buttock 02/09/2013  . Morbid obesity ([place]) 02/03/2013  . Diarrhea 02/03/2013  . Hyponatremia 02/03/2013  . DM (diabetes mellitus) ([place]) 02/02/2013  . CKD (chronic kidney disease), stage IV ([place]) 02/02/2013  . Cellulitis 02/02/2013  . PAD (peripheral artery disease) ([place]) 04/26/2011  . Tobacco abuse 04/26/2011    Past Surgical History:  Procedure Laterality Date  . ABDOMINAL HYSTERECTOMY    . COLON SURGERY  1995   DUE TO POLYP  . FOREARM FRACTURE SURGERY     Left arm  . HERNIA REPAIR     w/ mesh  . INCISION AND DRAINAGE ABSCESS Left 02/04/2013   Procedure: INCISION AND DRAINAGE LEFT BUTTOCK ABSCESS; INCISION AND DRAINAGE LEFT BREAST ABSCESS;  Surgeon: Harl Bowie, MD;  Location: [facility];  Service: General;  Laterality: Left;  . INCISION AND DRAINAGE ABSCESS N/A 02/12/2013   Procedure: INCISION AND DEBRIDEMENT BUTTOCK WOUND ;  Surgeon: [name]. Georgette Dover, MD;  Location: [facility];  Service: General;  Laterality: N/A;  . INCISION AND DRAINAGE ABSCESS N/A 02/14/2013   Procedure: INCISION AND DRAINAGE/DRESSING CHANGE;  Surgeon: Harl Bowie, MD;  Location: [facility];  Service: General;  Laterality: N/A;  . INCISION AND DRAINAGE ABSCESS N/A 03/21/2015   Procedure: INCISION AND DRAINAGE PUBIC ABSCESS;  Surgeon: Excell Seltzer, MD;  Location: [facility];  Service: General;  Laterality: N/A;  . INCISION AND DRAINAGE PERIRECTAL ABSCESS Left 02/10/2013   Procedure: IRRIGATION AND DEBRIDEMENT OF BUTTOCK/PERINEAL ABSCESS;  Surgeon: [name]. Georgette Dover, MD;  Location: [facility];  Service: General;  Laterality: Left;  . INCISION AND DRAINAGE PERIRECTAL ABSCESS N/A 02/16/2013   Procedure: IRRIGATION AND DEBRIDEMENT PERINEAL ABSCESS;  Surgeon: Zenovia Jarred, MD;  Location: [facility];  Service: General;  Laterality: N/A;  . IRRIGATION AND DEBRIDEMENT ABSCESS N/A 02/06/2013   Procedure: IRRIGATION AND DEBRIDEMENT BUTTOCK ABSCESS  AND DRESSING CHANGE;  Surgeon: Harl Bowie, MD;  Location: [facility];  Service: General;  Laterality: N/A;  . IRRIGATION AND DEBRIDEMENT ABSCESS Left 02/08/2013   Procedure: IRRIGATION AND DEBRIDEMENT ABSCESS/DRESSING CHANGE;  Surgeon: Gwenyth Ober, MD;  Location: [facility];  Service: General;  Laterality: Left;  . JOINT REPLACEMENT  2010 / 2012   . LAPAROSCOPIC CHOLECYSTECTOMY    . LEFT HEART CATHETERIZATION WITH CORONARY ANGIOGRAM N/A 07/02/2014   Procedure: LEFT HEART CATHETERIZATION WITH CORONARY ANGIOGRAM;  Surgeon: Lorretta Harp, MD;  Location: [facility] C[facility] LAB;  Service: Cardiovascular;  Laterality: N/A;  . LOWER EXTREMITY ANGIOGRAM Right 04/20/2016   Procedure: Lower Extremity Angiogram;  Surgeon: Elam Dutch, MD;  Location: [facility] CV LAB;  Service: Cardiovascular;  Laterality: Right;  . PERIPHERAL VASCULAR CATHETERIZATION N/A 04/20/2016   Procedure: Abdominal Aortogram;  Surgeon: Elam Dutch, MD;  Location: [facility] CV LAB;  Service: Cardiovascular;  Laterality: N/A;  . PERIPHERAL VASCULAR CATHETERIZATION Right 04/20/2016   Procedure: Peripheral Vascular Intervention;  Surgeon: Juanda Crumble  Antony Blackbird, MD;  Location: [facility] CV LAB;  Service: Cardiovascular;  Laterality: Right;  popiteal  . RIGHT HEART CATH N/A 10/27/2018   Procedure: RIGHT HEART CATH;  Surgeon: Larey Dresser, MD;  Location: [facility] CV LAB;  Service: Cardiovascular;  Laterality: N/A;  . RIGHT HEART CATH N/A 03/20/2019   Procedure: RIGHT HEART CATH;  Surgeon: Larey Dresser, MD;  Location: [facility] CV LAB;  Service: Cardiovascular;  Laterality: N/A;  . THYROIDECTOMY    . TOTAL HIP REVISION Right 08/11/2014   Procedure: RIGHT ACETABULAR REVISION;  Surgeon: Gearlean Alf, MD;  Location: [facility];  Service: Orthopedics;  Laterality: Right;     OB History   No obstetric history on file.    Home Medications    Prior to Admission medications   Medication Sig Start Date End Date Taking? Authorizing  Provider  albuterol (PROAIR HFA) 108 (90 Base) MCG/ACT inhaler Inhale 2 puffs into the lungs 4 (four) times daily as needed for wheezing or shortness of breath.    [provider]  albuterol (PROVENTIL) (2.5 MG/3ML) 0.083% nebulizer solution Take 2.5 mg by nebulization every 6 (six) hours as needed for wheezing or shortness of breath.    [provider]  allopurinol (ZYLOPRIM) 100 MG tablet Take 100 mg by mouth every morning.     [provider]  B Complex Vitamins (VITAMIN B COMPLEX PO) Take 1 tablet by mouth 2 (two) times daily.     [provider]  budesonide-formoterol (SYMBICORT) 160-4.5 MCG/ACT inhaler Inhale 2 puffs into the lungs 2 (two) times daily as needed (for SOB).     [provider]  carvedilol (COREG) 6.25 MG tablet TAKE 1 TABLET BY MOUTH TWICE DAILY, WITH A MEAL Patient taking differently: Take 6.25 mg by mouth 2 (two) times daily with a meal.  11/17/18   Bensimhon, Shaune Pascal, MD  cetirizine (ZYRTEC) 10 MG tablet Take 10 mg by mouth daily as needed for allergies.  03/14/15   [provider]  clopidogrel (PLAVIX) 75 MG tablet TAKE 1 TABLET BY MOUTH EVERY DAY Patient taking differently: Take 75 mg by mouth daily.  06/18/17   Waynetta Sandy, MD  Dulaglutide 1.5 MG/0.5ML SOPN Inject 1.5 mg into the skin every Monday.     [provider]  fenofibrate (TRICOR) 145 MG tablet TAKE 1 TABLET (145 MG TOTAL) BY MOUTH DAILY. Patient taking differently: Take 145 mg by mouth daily.  05/11/15   Lorretta Harp, MD  fluticasone (FLONASE) 50 MCG/ACT nasal spray Place 2 sprays into the nose as needed for allergies.     [provider]  hydrOXYzine (ATARAX/VISTARIL) 25 MG tablet Take 25 mg by mouth 2 (two) times daily as needed for itching.    [provider]  insulin lispro protamine-lispro (HUMALOG 75/25 MIX) (75-25) 100 UNIT/ML SUSP injection Inject 35-40 Units into the skin See admin instructions. 40units in the  morning and 35units in the evening    [provider]  levothyroxine (SYNTHROID, LEVOTHROID) 150 MCG tablet Take 150 mcg by mouth daily before breakfast.    [provider]  mirabegron ER (MYRBETRIQ) 50 MG TB24 tablet Take 50 mg by mouth daily.    [provider]  Multiple Vitamin (MULTIVITAMIN WITH MINERALS) TABS tablet Take 1 tablet by mouth daily.    [provider]  oxyCODONE-acetaminophen (PERCOCET) 10-325 MG tablet Take 1 tablet by mouth every 4 (four) hours as needed for pain.    [provider]  pantoprazole (PROTONIX) 40 MG tablet Take 1 tablet (40 mg total) by mouth daily. 03/22/19   Guilford Shi, MD  polyvinyl alcohol (LIQUIFILM TEARS) 1.4 % ophthalmic solution Place 2 drops into both eyes daily as needed for dry eyes.    [provider]  simvastatin (ZOCOR) 20 MG tablet Take 20 mg by mouth at bedtime. 04/08/17   [provider]  tiZANidine (ZANAFLEX) 4 MG capsule Take 4 mg by mouth 2 (two) times daily.     [provider]  torsemide (DEMADEX) 20 MG tablet Take 4 tablets (80 mg total) by mouth every morning AND 2 tablets (40 mg total) every evening. 10/31/18   Mariel Aloe, MD    Family History Family History  Problem Relation Age of Onset  . Diabetes Brother   . Cardiomyopathy Mother   . Heart disease Mother   . Cancer Father     Social History Social History   Tobacco Use  . Smoking status: Current Some Day Smoker    Packs/day: 0.25    Years: 40.00    Pack years: 10.00    Types: Cigarettes  . Smokeless tobacco: Never Used  Substance Use Topics  . Alcohol use: Yes    Alcohol/week: 1.0 standard drinks    Types: 1 Standard drinks or equivalent per week    Comment: OCC  . Drug use: No    Comment: Quit four years ago.     Allergies   Nebivolol, Ace inhibitors, Bystolic [nebivolol hcl], and Morphine and related   Review of Systems Review of Systems  Constitutional: Positive for fatigue.  Negative for activity change, appetite change, chills, diaphoresis, fever and unexpected weight change.  HENT: Negative.   Respiratory: Positive for cough and shortness of breath. Negative for apnea, choking, chest tightness, wheezing and stridor.   Cardiovascular: Positive for chest pain. Negative for palpitations and leg swelling.  Genitourinary: Negative.   Musculoskeletal: Negative.   Skin: Negative.   Neurological: Negative.   All other systems reviewed and are negative.  Physical Exam Updated Vital Signs BP (!) 114/56   Pulse 80   Temp 98.2 F (36.8 C)   Resp 17   Ht 5\' 4"  (1.626 m)   Wt 117.9 kg   SpO2 91%   BMI 44.63 kg/m   Physical Exam Vitals signs and nursing note reviewed.  Constitutional:      General: She is not in acute distress.    Appearance: She is well-developed. She is not ill-appearing, toxic-appearing or diaphoretic.  HENT:     Head: Normocephalic and atraumatic.     Mouth/Throat:     Mouth: Mucous membranes are moist.     Pharynx: Oropharynx is clear.  Eyes:     Pupils: Pupils are equal, round, and reactive to light.  Neck:     Musculoskeletal: Normal range of motion.  Cardiovascular:     Rate and Rhythm: Normal rate.     Pulses: Normal pulses.     Heart sounds: Normal heart sounds.  Pulmonary:     Effort: Pulmonary effort is normal. No accessory muscle usage or respiratory distress.     Breath sounds: Normal breath sounds. No decreased breath sounds.  Abdominal:     General: Bowel sounds are normal. There is no distension.     Palpations: Abdomen is soft.  Musculoskeletal: Normal range of motion.     Right lower leg: She exhibits no tenderness. No edema.     Left lower leg: She exhibits no  tenderness. No edema.     Comments: 2+ bilateral lower extremity pitting edema up to shins.  No tenderness palpation of posterior legs.  No unilateral leg swelling.  No edema or warmth.  2+  DP, PT pulses bilaterally.  Skin:    General: Skin is warm and  dry.     Capillary Refill: Capillary refill takes less than 2 seconds.  Neurological:     General: No focal deficit present.     Mental Status: She is alert and oriented to person, place, and time.    ED Treatments / Results  Labs (all labs ordered are listed, but only abnormal results are displayed) Labs Reviewed  CBC WITH DIFFERENTIAL/PLATELET - Abnormal; Notable for the following components:      Result Value   WBC 12.1 (*)    RDW 15.8 (*)    Neutro Abs 9.2 (*)    All other components within normal limits  COMPREHENSIVE METABOLIC PANEL - Abnormal; Notable for the following components:   Potassium 3.4 (*)    CO2 33 (*)    BUN 64 (*)    Creatinine, Ser 2.20 (*)    Calcium 10.6 (*)    GFR calc non Af Amer 22 (*)    GFR calc Af Amer 26 (*)    All other components within normal limits  TROPONIN I (HIGH SENSITIVITY) - Abnormal; Notable for the following components:   Troponin I (High Sensitivity) 93 (*)    All other components within normal limits  TROPONIN I (HIGH SENSITIVITY) - Abnormal; Notable for the following components:   Troponin I (High Sensitivity) 88 (*)    All other components within normal limits  BRAIN NATRIURETIC PEPTIDE - Abnormal; Notable for the following components:   B Natriuretic Peptide 294.4 (*)    All other components within normal limits  URINALYSIS, ROUTINE W REFLEX MICROSCOPIC - Abnormal; Notable for the following components:   APPearance HAZY (*)    All other components within normal limits  SARS CORONAVIRUS 2 (HOSPITAL ORDER, [facility])  CULTURE, BLOOD (ROUTINE X 2)  CULTURE, BLOOD (ROUTINE X 2)  MAGNESIUM    EKG EKG Interpretation  Date/Time:  Wednesday April 08 2019 12:43:21 EDT Ventricular Rate:  76 PR Interval:    QRS Duration: 144 QT Interval:  408 QTC Calculation: 459 R Axis:   -37 Text Interpretation:  Normal sinus rhythm Premature ventricular complexes Non-specific intra-ventricular conduction delay No  significant change since last tracing except pvcs Confirmed by Pattricia Boss [phone]) on 04/08/2019 1:09:14 PM   Radiology Dg Chest Portable 1 View  Result Date: 04/08/2019 CLINICAL DATA:  Shortness of breath EXAM: PORTABLE CHEST 1 VIEW COMPARISON:  March 16, 2019 FINDINGS: Lungs are clear. Heart is mildly enlarged with pulmonary vascularity within normal limits. No adenopathy. No bone lesions. No pneumothorax. IMPRESSION: No edema or consolidation. Mild cardiomegaly. No adenopathy evident. Electronically Signed   By: Lowella Grip III M.D.   On: 04/08/2019 13:08    Procedures Procedures (including critical care time)  Medications Ordered in ED Medications  aspirin chewable tablet 324 mg (324 mg Oral Given 04/08/19 1527)  furosemide (LASIX) injection 60 mg (60 mg Intravenous Given 04/08/19 1538)   Initial Impression / Assessment and Plan / ED Course  I have reviewed the triage vital signs and the nursing notes.  Pertinent labs & imaging results that were available during my care of the patient were reviewed by me and considered in my medical decision making (see  chart for details).  68 year old who appears otherwise well presents for evaluation of SOB.  One episode of chest pain this morning.  None currently.  Patient seen by cardiology today and was noted to have positive questionnaire for COVID.  Feeling never did recently test positive for coded.  Has generalized aches and pains.  Heart clear.  Lungs mildly wet.  She does have 2+ bilateral pitting edema.  Patient states she is up 6 pounds from her dry weight.  Has recently increased her torsemide at home.  Afebrile, nonseptic, non-ill-appearing.  No tachycardia, tachypnea or hypoxia.  Labs and imaging personally reviewed CBC eval leukocytosis at 22.2 Metabolic panel mild hypokalemia at 3.4, creatinine 2.20, consistent with her baseline Troponin 93, repeat 88--- Troponin consistent with prior troponin's. No significant increase Magnesium 2.1  BNP 294.4 COVID negative  Prior Medical records reviewed.  ECHO 03/18/19 with EF at 40-45%.  Cardiac catheterization 2015 with normal coronary arteries.  Patient without any complaints.  She denies any chest pain or shortness of breath.  Delta torponin negative.  Mildly elevated BNP however chest x-ray negative for pulmonary edema.  Shared decision making with patient possible inpatient admission for diuresis versus home therapy.  Patient prefers to increase her home medications and follow-up with cardiology.  Patient was previously told by cardiology and nephrology she may increase her torsemide as needed.  She is without tachycardia, tachypnea or hypoxia.  She has no new onset oxygen requirement.  Feel this is reasonable for her to increase her torsemide and have close follow-up with cardiology or her PCP.  Ambulatory in her room with oxygen saturations greater than 95%.  Low suspicion for ACS, PE, dissection, infectious process, pericarditis, myocarditis or endocarditis as cause of her shortness of breath.  Patient is to be discharged with recommendation to follow up with Cardiology in regards to today's hospital visit. Wells criteria low risk, VSS, no tracheal deviation, no JVD or new murmur, RRR, breath sounds equal bilaterally, EKG without acute abnormalities, troponin consistent with CHF, and negative CXR. Pt has been advised to return to the ED if CP becomes exertional, associated with diaphoresis or nausea, radiates to left jaw/arm, worsens or becomes concerning in any way. Pt appears reliable for follow up and is agreeable to discharge.     Patient has been seen evaluated by my attending, Dr. [name] who agrees above treatment, plan and disposition.   Final Clinical Impressions(s) / ED Diagnoses   Final diagnoses:  Acute on chronic congestive heart failure, unspecified heart failure type ([place])  Exposure to Covid-19 Virus    ED Discharge Orders    None       Dionicia Cerritos A, PA-C  04/08/19 1705    Keithen Capo A, PA-C 04/08/19 1708    Pattricia Boss, MD 04/09/19 1432

## 2019-04-08 NOTE — Progress Notes (Signed)
   Today she showed up to the HF clinic for an appointment. Prescreening questions complete prior to being roomed. At that time she denied being exposed to coronavirus.   I entered the room and she was reporting body aches, chills, and shortness of breath. She was wearing a mask the entire time.   I asked if she had been around anyone sick and she said that her granddaughter was + for Covid 19. Apparently she was around her  3-4 days ago and just found out today but forgot to mention that during her screening.     She was taken to [facility] for further evaluation and COVID 19 testing.   Alexya Mcdaris NP-C  12:10 PM

## 2019-04-13 LAB — CULTURE, BLOOD (ROUTINE X 2)
Culture: NO GROWTH
Culture: NO GROWTH
Special Requests: ADEQUATE

## 2019-04-30 ENCOUNTER — Other Ambulatory Visit: Payer: Self-pay

## 2019-04-30 ENCOUNTER — Encounter (HOSPITAL_COMMUNITY)
Admission: RE | Admit: 2019-04-30 | Discharge: 2019-04-30 | Disposition: A | Discharge status: Home / Self Care | Payer: Medicare Other | Source: Ambulatory Visit | Attending: Primary Care | Admitting: Primary Care

## 2019-04-30 DIAGNOSIS — K760 Fatty (change of) liver, not elsewhere classified: Secondary | ICD-10-CM | POA: Diagnosis not present

## 2019-04-30 DIAGNOSIS — N281 Cyst of kidney, acquired: Secondary | ICD-10-CM | POA: Diagnosis not present

## 2019-04-30 DIAGNOSIS — R911 Solitary pulmonary nodule: Secondary | ICD-10-CM

## 2019-04-30 LAB — GLUCOSE, CAPILLARY: Glucose-Capillary: 119 mg/dL — ABNORMAL HIGH (ref 70–99)

## 2019-04-30 MED ORDER — FLUDEOXYGLUCOSE F - 18 (FDG) INJECTION
12.9700 | Freq: Once | INTRAVENOUS | Status: AC | PRN
  Administered 2019-04-30: 12.97 via INTRAVENOUS

## 2019-05-01 ENCOUNTER — Other Ambulatory Visit: Payer: Self-pay | Admitting: *Deleted

## 2019-05-01 DIAGNOSIS — R911 Solitary pulmonary nodule: Secondary | ICD-10-CM

## 2019-05-01 NOTE — Progress Notes (Signed)
Spoke with patient regarding PET scan results. Can you please mail results to patient and order CT chest w/o contrast in 3-6 months re: LLL nodule

## 2019-05-05 ENCOUNTER — Telehealth (HOSPITAL_COMMUNITY): Payer: Self-pay | Admitting: Licensed Clinical Social Worker

## 2019-05-05 NOTE — Telephone Encounter (Signed)
CSW contacted patient to inform the last delivery of the [employer] Summer Covid relief program will be July 31st. Patient verbalizes understanding and grateful for the support during this health crisis. Patient provided with the [employer] number to contact if interested in continuing with a private pay option. Patient denies any other concerns at this time. CSW continues to be available if needed.  Raquel Sarna, [place], [place]

## 2019-05-14 ENCOUNTER — Ambulatory Visit
Admission: RE | Admit: 2019-05-14 | Discharge: 2019-05-14 | Disposition: A | Discharge status: Home / Self Care | Payer: Medicare Other | Source: Ambulatory Visit | Attending: Internal Medicine | Admitting: Internal Medicine

## 2019-05-14 ENCOUNTER — Other Ambulatory Visit: Payer: Self-pay

## 2019-05-14 DIAGNOSIS — Z1231 Encounter for screening mammogram for malignant neoplasm of breast: Secondary | ICD-10-CM

## 2019-07-15 ENCOUNTER — Other Ambulatory Visit: Payer: Self-pay | Admitting: Internal Medicine

## 2019-07-15 ENCOUNTER — Other Ambulatory Visit (HOSPITAL_COMMUNITY): Payer: Self-pay | Admitting: Internal Medicine

## 2019-07-15 DIAGNOSIS — E876 Hypokalemia: Secondary | ICD-10-CM

## 2019-07-24 ENCOUNTER — Telehealth: Payer: Self-pay | Admitting: *Deleted

## 2019-07-24 NOTE — Telephone Encounter (Signed)
   [facility] Pre-operative Risk Assessment    Request for surgical clearance:  1. What type of surgery is being performed? TBD  2. When is this surgery scheduled? L4-5 LUMBAR FUSION  3. What type of clearance is required (medical clearance vs. Pharmacy clearance to hold med vs. Both)? Medical   4. Are there any medications that need to be held prior to surgery and how long?plavix  5. Practice name and name of physician performing surgery? [facility]; DR DAVID Adah Salvage  6. What is your office phone number [phone]  EXT [phone]     7.   What is your office fax number [phone]  8.   Anesthesia type (None, local, MAC, general) ? GENERAL   [name] [name] 07/24/2019, 12:25 PM  _________________________________________________________________   (provider comments below)

## 2019-07-27 NOTE — Telephone Encounter (Signed)
Pt contacted to evaluate for cardiac operative clearance. At this time she is very limited in her activity (only able to wash dishes while sitting). She will need an office visit for cardiac evaluation.

## 2019-07-27 NOTE — Telephone Encounter (Signed)
Pt has been scheduled to see Truitt Merle, NP, 08/04/2019 at 11:30. Pt has been made aware that her appt is at the [facility] st location as Dr. Kennon Holter office doesn't have any openings in the next week or so. Pt grateful and confirmed the appt. Will route back to the requesting surgeon's office to make them aware.

## 2019-07-29 ENCOUNTER — Telehealth: Payer: Self-pay

## 2019-07-29 ENCOUNTER — Encounter (HOSPITAL_COMMUNITY): Payer: Medicare Other

## 2019-07-29 ENCOUNTER — Telehealth: Payer: Self-pay | Admitting: Primary Care

## 2019-07-29 DIAGNOSIS — R911 Solitary pulmonary nodule: Secondary | ICD-10-CM

## 2019-07-29 NOTE — Telephone Encounter (Signed)
Osvaldo Shipper, NT  P [facility] Triage Pool  Cc: McMichael, Orland Mustard, Cameron Proud, Zachery Conch, Doristine Devoid, Bret Harte J        Please put in new order and reschedule patient at another facility. We are unable to do CT because of insurance. Patient was scheduled 10/26.   Pt is aware appt was canceled.   Thank you   Erline Levine

## 2019-07-29 NOTE — Telephone Encounter (Signed)
Order placed Will route to PCC's.

## 2019-07-29 NOTE — Progress Notes (Signed)
CARDIOLOGY OFFICE NOTE  Date:  08/04/2019    [name] [name] Date of Birth: 1951/06/15 Medical Record #[id number]  PCP:  [name], MD  Cardiologist:  Manson Allan   Chief Complaint  Patient presents with  . Pre-op Exam    Seen for Dr. Gwenlyn Found    History of Present Illness: [name] [name] is a 68 y.o. female who presents today for a pre op visit. Seen for Dr. Gwenlyn Found and Dr. Aundra Dubin (she is followed in CHF).   She has a history of HTN, DM, HLD, CKD stage III, COPD, OSA, ongoing tobacco abuse, morbid obesity, prior cath showing normal coronaries in 2015, PAD - followed at [facility], and chronic combined systolic and diastolic HF. She is on Plavix - looks like for prior TIA and possibly her PAD - no apparent cardiac indication for Plavix.   Had been admitted in June with heart failure - had elevated troponin but felt to be due to demand ischemia. Still smoking. Found to also have esophageal dysmotility. She had right heart cath showing no evidence of significant volume overload.   Was to have been seen in CHF clinic in early July but endorsed aches, chills and SOB - had had family member + for COVID - she was sent to the ER for evaluation and stable findings.   The patient does not have symptoms concerning for COVID-19 infection (fever, chills, cough, or new shortness of breath).   Comes in today. Here alone. She is in a wheelchair. She is needing clearance for back surgery with Dr. Ronnald Ramp. Also has planned wrist surgery with Dr. Burney Gauze. Her hand surgery she says is tomorrow. She denies chest pain. She has what sounds like stable DOE that she attributes to her asthma. She is very sedentary. She uses a walker and cane in her apartment. She has an aid that helps with housework and ADL's. She has been trying to intentionally work on her weight and has been successful. She is still smoking - 1/2 pack a day. She notes less swelling - right foot is always worse by her report. No  syncope noted.   Past Medical History:  Diagnosis Date  . Anemia   . Arthritis   . Asthma   . Bronchitis   . CHF (congestive heart failure) ([place])   . Chronic kidney disease   . Chronic kidney disease   . Chronic pain syndrome   . Cramping of feet   . Diabetes mellitus   . Dyspnea   . GERD (gastroesophageal reflux disease)   . Gout   . Headache   . History of hip replacement, total   . History of transfusion   . Hx MRSA infection    abscess left groin  . Hx of cardiac cath 06/2014  . Hyperlipidemia   . Hypertension   . Hypothyroid   . 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 Morbidly obese ([place])   . Peripheral vascular disease ([place])   . Positive cardiac stress test 12/2013   anterior and lateral ischemia on Myoview  . Sleep apnea    UNABLE TO TOLERATE C PAP  . Stress incontinence   . Thyroid disease   . TIA (transient ischemic attack)     X2 NO RESIDUAL PROBLEMS    Past Surgical History:  Procedure Laterality Date  . ABDOMINAL HYSTERECTOMY    . COLON SURGERY  1995   DUE TO POLYP  . FOREARM FRACTURE SURGERY     Left  arm  . HERNIA REPAIR     w/ mesh  . INCISION AND DRAINAGE ABSCESS Left 02/04/2013   Procedure: INCISION AND DRAINAGE LEFT BUTTOCK ABSCESS; INCISION AND DRAINAGE LEFT BREAST ABSCESS;  Surgeon: Harl Bowie, MD;  Location: [place];  Service: General;  Laterality: Left;  . INCISION AND DRAINAGE ABSCESS N/A 02/12/2013   Procedure: INCISION AND DEBRIDEMENT BUTTOCK WOUND ;  Surgeon: [name]. Georgette Dover, MD;  Location: [place];  Service: General;  Laterality: N/A;  . INCISION AND DRAINAGE ABSCESS N/A 02/14/2013   Procedure: INCISION AND DRAINAGE/DRESSING CHANGE;  Surgeon: Harl Bowie, MD;  Location: [facility];  Service: General;  Laterality: N/A;  . INCISION AND DRAINAGE ABSCESS N/A 03/21/2015   Procedure: INCISION AND DRAINAGE PUBIC ABSCESS;  Surgeon: Excell Seltzer, MD;  Location: [facility];  Service: General;  Laterality: N/A;  . INCISION AND DRAINAGE PERIRECTAL  ABSCESS Left 02/10/2013   Procedure: IRRIGATION AND DEBRIDEMENT OF BUTTOCK/PERINEAL ABSCESS;  Surgeon: [name]. Georgette Dover, MD;  Location: [place];  Service: General;  Laterality: Left;  . INCISION AND DRAINAGE PERIRECTAL ABSCESS N/A 02/16/2013   Procedure: IRRIGATION AND DEBRIDEMENT PERINEAL ABSCESS;  Surgeon: Zenovia Jarred, MD;  Location: [facility];  Service: General;  Laterality: N/A;  . IRRIGATION AND DEBRIDEMENT ABSCESS N/A 02/06/2013   Procedure: IRRIGATION AND DEBRIDEMENT BUTTOCK ABSCESS AND DRESSING CHANGE;  Surgeon: Harl Bowie, MD;  Location: [employer];  Service: General;  Laterality: N/A;  . IRRIGATION AND DEBRIDEMENT ABSCESS Left 02/08/2013   Procedure: IRRIGATION AND DEBRIDEMENT ABSCESS/DRESSING CHANGE;  Surgeon: Gwenyth Ober, MD;  Location: [place];  Service: General;  Laterality: Left;  . JOINT REPLACEMENT  2010 / 2012   . LAPAROSCOPIC CHOLECYSTECTOMY    . LEFT HEART CATHETERIZATION WITH CORONARY ANGIOGRAM N/A 07/02/2014   Procedure: LEFT HEART CATHETERIZATION WITH CORONARY ANGIOGRAM;  Surgeon: Lorretta Harp, MD;  Location: [facility];  Service: Cardiovascular;  Laterality: N/A;  . LOWER EXTREMITY ANGIOGRAM Right 04/20/2016   Procedure: Lower Extremity Angiogram;  Surgeon: Elam Dutch, MD;  Location: [place] CV [facility];  Service: Cardiovascular;  Laterality: Right;  . PERIPHERAL VASCULAR CATHETERIZATION N/A 04/20/2016   Procedure: Abdominal Aortogram;  Surgeon: Elam Dutch, MD;  Location: [place] [facility];  Service: Cardiovascular;  Laterality: N/A;  . PERIPHERAL VASCULAR CATHETERIZATION Right 04/20/2016   Procedure: Peripheral Vascular Intervention;  Surgeon: Elam Dutch, MD;  Location: [place] [facility];  Service: Cardiovascular;  Laterality: Right;  popiteal  . RIGHT HEART CATH N/A 10/27/2018   Procedure: RIGHT HEART CATH;  Surgeon: Larey Dresser, MD;  Location: [place] [facility];  Service: Cardiovascular;  Laterality: N/A;  . RIGHT HEART CATH N/A 03/20/2019    Procedure: RIGHT HEART CATH;  Surgeon: Larey Dresser, MD;  Location: [place] [facility];  Service: Cardiovascular;  Laterality: N/A;  . THYROIDECTOMY    . TOTAL HIP REVISION Right 08/11/2014   Procedure: RIGHT ACETABULAR REVISION;  Surgeon: Gearlean Alf, MD;  [facility];  Service: Orthopedics;  Laterality: Right;     Medications: Current Meds  Medication Sig  . albuterol (PROAIR HFA) 108 (90 Base) MCG/ACT inhaler Inhale 2 puffs into the lungs 4 (four) times daily as needed for wheezing or shortness of breath.  Marland Kitchen albuterol (PROVENTIL) (2.5 MG/3ML) 0.083% nebulizer solution Take 2.5 mg by nebulization every 6 (six) hours as needed for wheezing or shortness of breath.  . allopurinol (ZYLOPRIM) 100 MG tablet Take 100 mg by mouth every morning.   Marland Kitchen  B Complex Vitamins (VITAMIN B COMPLEX PO) Take 1 tablet by mouth 2 (two) times daily.   . budesonide-formoterol (SYMBICORT) 160-4.5 MCG/ACT inhaler Inhale 2 puffs into the lungs 2 (two) times daily as needed (for SOB).   . carvedilol (COREG) 6.25 MG tablet TAKE 1 TABLET BY MOUTH TWICE DAILY, WITH A MEAL (Patient taking differently: Take 1 1/2 tabs per day)  . cetirizine (ZYRTEC) 10 MG tablet Take 10 mg by mouth daily as needed for allergies.   Marland Kitchen clopidogrel (PLAVIX) 75 MG tablet TAKE 1 TABLET BY MOUTH EVERY DAY  . Dulaglutide 1.5 MG/0.5ML SOPN Inject 1.5 mg into the skin every Monday.   . fenofibrate (TRICOR) 145 MG tablet TAKE 1 TABLET (145 MG TOTAL) BY MOUTH DAILY.  . fluticasone (FLONASE) 50 MCG/ACT nasal spray Place 2 sprays into the nose as needed for allergies.   . furosemide (LASIX) 80 MG tablet Take 80 mg by mouth 2 (two) times daily.  . hydrOXYzine (ATARAX/VISTARIL) 25 MG tablet Take 25 mg by mouth 2 (two) times daily as needed for itching.  . insulin lispro protamine-lispro (HUMALOG 75/25 MIX) (75-25) 100 UNIT/ML SUSP injection Inject 35-40 Units into the skin See admin instructions. 40units in the morning and 35units in the evening   . levothyroxine (SYNTHROID, LEVOTHROID) 150 MCG tablet Take 150 mcg by mouth daily before breakfast.  . metolazone (ZAROXOLYN) 2.5 MG tablet Take 2.5 mg by mouth daily as needed.  . mirabegron ER (MYRBETRIQ) 50 MG TB24 tablet Take 50 mg by mouth daily.  . Multiple Vitamin (MULTIVITAMIN WITH MINERALS) TABS tablet Take 1 tablet by mouth daily.  . Multiple Vitamin (MULTIVITAMIN) capsule Take by mouth. Use as directed  . oxyCODONE-acetaminophen (PERCOCET) 10-325 MG tablet Take 1 tablet by mouth every 4 (four) hours as needed for pain.  . pantoprazole (PROTONIX) 40 MG tablet Take 1 tablet (40 mg total) by mouth daily.  . polyvinyl alcohol (LIQUIFILM TEARS) 1.4 % ophthalmic solution Place 2 drops into both eyes daily as needed for dry eyes.  . Potassium Chloride ER 20 MEQ TBCR Take 1 tablet by mouth daily.  . simvastatin (ZOCOR) 20 MG tablet Take 20 mg by mouth at bedtime.  Marland Kitchen tiZANidine (ZANAFLEX) 4 MG capsule Take 4 mg by mouth 2 (two) times daily.   Marland Kitchen torsemide (DEMADEX) 20 MG tablet Take 4 tablets (80 mg total) by mouth every morning AND 2 tablets (40 mg total) every evening.     Allergies: Allergies  Allergen Reactions  . Nebivolol Swelling  . Ace Inhibitors Swelling and Other (See Comments)    Tongue swell  . Bystolic [Nebivolol Hcl] Swelling and Other (See Comments)    Chest pain   . Morphine And Related Itching    Social History: The patient  reports that she has been smoking cigarettes. She has a 10.00 pack-year smoking history. She has never used smokeless tobacco. She reports current alcohol use of about 1.0 standard drinks of alcohol per week. She reports that she does not use drugs.   Family History: The patient's family history includes Cancer in her father; Cardiomyopathy in her mother; Diabetes in her brother; Heart disease in her mother.   Review of Systems: Please see the history of present illness.   All other systems are reviewed and negative.   Physical Exam: VS:   BP 140/72   Pulse 85   Ht 5\' 4"  (1.626 m)   Wt 248 lb 12.8 oz (112.9 kg)   SpO2 96%   BMI 42.71 kg/m  .  BMI Body mass index is 42.71 kg/m.  Wt Readings from Last 3 Encounters:  08/04/19 248 lb 12.8 oz (112.9 kg)  04/08/19 260 lb (117.9 kg)  03/21/19 257 lb 8 oz (116.8 kg)    General: Pleasant. Morbidly obese. Alert and in no acute distress.   HEENT: Normal.  Neck: Supple with no JVD.   Cardiac: Heart tones are distant due to body habitus.  Respiratory:  Lungs are fairly clear to auscultation bilaterally with normal work of breathing.  GI: Obese, Soft and nontender.  MS: No deformity or atrophy. Gait not tested. She is in a wheelchair. Has a cyst on the right wrist noted.  Skin: Warm and dry. Color is normal.  Neuro:  Strength and sensation are intact and no gross focal deficits noted.  Psych: Alert, appropriate and with normal affect.   LABORATORY DATA:  EKG:  EKG is ordered today. This demonstrates NSR - with LBBB. This is unchanged.   Lab Results  Component Value Date   WBC 12.1 (H) 04/08/2019   HGB 12.3 04/08/2019   HCT 39.8 04/08/2019   PLT 356 04/08/2019   GLUCOSE 76 04/08/2019   CHOL 130 03/22/2015   TRIG 144 03/22/2015   HDL 27 (L) 03/22/2015   LDLCALC 74 03/22/2015   ALT 14 04/08/2019   AST 24 04/08/2019   NA 143 04/08/2019   K 3.4 (L) 04/08/2019   CL 99 04/08/2019   CREATININE 2.20 (H) 04/08/2019   BUN 64 (H) 04/08/2019   CO2 33 (H) 04/08/2019   TSH 0.599 03/16/2019   INR 0.96 10/20/2018   HGBA1C 7.0 (H) 11/28/2018     BNP (last 3 results) Recent Labs    11/14/18 0936 03/16/19 1341 04/08/19 1245  BNP 206.8* 196.4* 294.4*    ProBNP (last 3 results) No results for input(s): PROBNP in the last 8760 hours.   Other Studies Reviewed Today:  RIGHT HEART CATH 03/2019  Conclusion  Low filling pressures, no significant CHF.      ECHO IMPRESSIONS 03/2019   1. The left ventricle has mild-moderately reduced systolic function, with an  ejection fraction of 40-45%. The cavity size was normal. Left ventricular diastolic Doppler parameters are consistent with impaired relaxation.  2. Left atrial size was not well visualized.  3. The aortic root and ascending aorta are normal in size and structure.  4. The interatrial septum was not well visualized.   Myoview 2015 Overall Impression:  High risk stress nuclear study with significant reversible anterior ischemia.  LV Wall Motion:  Global hypokinesis with EF of 32% ( there is gating abnormality in the raw images, therefore, the EF may not be accurate)  Pixie Casino, MD, [facility] Board Certified in Nuclear Cardiology Attending Cardiologist [place], MD  06/24/2014 1:09 PM   ANGIOGRAPHIC RESULTS:   1. Left main; normal  2. LAD; normal 3. Left circumflex; left dominant and normal.  4. Right coronary artery; nondominant and normal 5. Left ventriculography; RAO left ventriculogram was performed using  25 mL of Visipaque dye at 12 mL/second. The overall LVEF estimated  40-45 % Without wall motion abnormalities  IMPRESSION:Ms. Verma has normal coronary arteries and mild LV dysfunction. She is left dominant. I believe her Myoview was false positive because of her body habitus. She'll be cleared for her upcoming hip surgery at low cardiovascular risk. The sheath was removed and a TR Band was placed on the right wrist to achieve patent hemostasis. The patient left the lab in  stable condition. She'll be hydrated for 2 hours and discharged home.  Lorretta Harp MD, [facility] 07/02/2014 11:05 AM   Assessment/Plan:  1. Pre op clearance for lumbar fusion surgery as well as wrist surgery - she will be at moderate risk given her multiple co-morbidities. She has no active chest pain at this time. Her breathing seems stable. We do not prescribe her Plavix - looks to be on for prior TIA/?PAD. Unclear to me if this needed to be held for tomorrow's  procedure. She has had prior false + Myoview due to body habitus. I do not think further testing is warranted, nor will it reduce her risk. Plavix should be addressed by prescribing provider.   2. Chronic combined systolic and diastolic HF - weight is trending down - she says her breathing is stable and that her swelling is improving. She seems to be doing better with salt restriction.   3. CKD - progressive - followed by Nephrology by her report.   4. Morbid obesity - crux of her issues - very immobile.   5. COPD - breathing reportedly stable.   6. Ongoing tobacco abuse - total abstinence recommended - especially with upcoming surgery. I do not get the feeling she is ready to stop.   7. Lung nodule - has had PET scan from July that showed no hypermetabolism in the left lower lobe nodule however "well differentiated or low grade neoplasm can be poorly FDG" and close follow up was recommended.   8. HTN - BP is fair.   9. HLD - on Zocor and Tricor - she says her labs are checked by her PCP.   10. DM - per PCP  11. History of TIA - on Plavix  12. Aortic atherosclerosis - noted on PET - has had normal coronaries by cath in 2015 - she has no active chest pain but multiple risk factors and needs aggressive CV risk factor modification.   71. COVID-19 Education: The signs and symptoms of COVID-19 were discussed with the patient and how to seek care for testing (follow up with PCP or arrange E-visit).  The importance of social distancing, staying at home, hand hygiene and wearing a mask when out in public were discussed today.  Current medicines are reviewed with the patient today.  The patient does not have concerns regarding medicines other than what has been noted above.  The following changes have been made:  See above.  Labs/ tests ordered today include:    Orders Placed This Encounter  Procedures  . EKG 12-Lead     Disposition:   FU with Dr. Gwenlyn Found in 6 months.   Patient is  agreeable to this plan and will call if any problems develop in the interim.   SignedTruitt Merle, NP  08/04/2019 12:47 PM  [place] [address] [place],   [address] Phone: [phone] Fax: [phone]

## 2019-07-29 NOTE — Telephone Encounter (Signed)
-----   Message from Osvaldo Shipper, [place] sent at 07/29/2019  4:22 PM EDT ----- Regarding: CT need New order and to be Resch Please put in new order and reschedule patient at another facility. We are unable to do CT because of insurance. Patient was scheduled 10/26.   Pt is aware appt was canceled.  Thank you   Erline Levine

## 2019-08-03 ENCOUNTER — Encounter: Payer: Self-pay | Admitting: Nurse Practitioner

## 2019-08-03 ENCOUNTER — Other Ambulatory Visit: Payer: Medicare Other

## 2019-08-03 ENCOUNTER — Inpatient Hospital Stay: Admission: RE | Admit: 2019-08-03 | Payer: Medicare Other | Source: Ambulatory Visit

## 2019-08-03 ENCOUNTER — Telehealth: Payer: Self-pay | Admitting: *Deleted

## 2019-08-03 NOTE — Telephone Encounter (Signed)
The patient has an appointment with Truitt Merle, NP tomorrow for surgical clearance (separate procedure). Clearance for this procedure can be determined at that visit as well. I will fwd to Truitt Merle, NP and remove the phone note from the preop pool. Richardson Dopp, PA-C    08/03/2019 3:34 PM

## 2019-08-03 NOTE — Telephone Encounter (Signed)
   [facility] Pre-operative Risk Assessment    Request for surgical clearance: PT ALSO HAS ANOTHER SURGERY CLEARANCE PUT IN 07/24/19 BY SHARON MARTIN FOR LUMBAR FUSION  1. What type of surgery is being performed? EXCISION OF RIGHT WRIST VOLAR GANGLION CYST and RIGHT  THUMB CARPOMETACARPAL JOINT CYST   2. When is this surgery scheduled? 08/05/19   3. What type of clearance is required (medical clearance vs. Pharmacy clearance to hold med vs. Both)? MEDICAL  4. Are there any medications that need to be held prior to surgery and how long? PLAVIX    5. Practice name and name of physician performing surgery? [facility]; DR. Charlotte Crumb   6. What is your office phone number [phone]    7.   What is your office fax number [phone]  8.   Anesthesia type (None, local, MAC, general) ? AXILLARY BLOCK, MAC   Julaine Hua 08/03/2019, 3:01 PM  _________________________________________________________________   (provider comments below)

## 2019-08-04 ENCOUNTER — Ambulatory Visit (INDEPENDENT_AMBULATORY_CARE_PROVIDER_SITE_OTHER): Payer: Medicare Other | Admitting: Nurse Practitioner

## 2019-08-04 ENCOUNTER — Encounter: Payer: Self-pay | Admitting: Nurse Practitioner

## 2019-08-04 ENCOUNTER — Other Ambulatory Visit: Payer: Self-pay

## 2019-08-04 VITALS — BP 140/72 | HR 85 | Ht 64.0 in | Wt 248.8 lb

## 2019-08-04 DIAGNOSIS — Z7189 Other specified counseling: Secondary | ICD-10-CM

## 2019-08-04 DIAGNOSIS — E669 Obesity, unspecified: Secondary | ICD-10-CM | POA: Diagnosis not present

## 2019-08-04 DIAGNOSIS — E7800 Pure hypercholesterolemia, unspecified: Secondary | ICD-10-CM

## 2019-08-04 DIAGNOSIS — Z720 Tobacco use: Secondary | ICD-10-CM | POA: Diagnosis not present

## 2019-08-04 DIAGNOSIS — I5042 Chronic combined systolic (congestive) and diastolic (congestive) heart failure: Secondary | ICD-10-CM | POA: Diagnosis not present

## 2019-08-04 DIAGNOSIS — Z01810 Encounter for preprocedural cardiovascular examination: Secondary | ICD-10-CM

## 2019-08-04 DIAGNOSIS — N189 Chronic kidney disease, unspecified: Secondary | ICD-10-CM

## 2019-08-04 DIAGNOSIS — N184 Chronic kidney disease, stage 4 (severe): Secondary | ICD-10-CM

## 2019-08-04 DIAGNOSIS — I130 Hypertensive heart and chronic kidney disease with heart failure and stage 1 through stage 4 chronic kidney disease, or unspecified chronic kidney disease: Secondary | ICD-10-CM

## 2019-08-04 NOTE — Addendum Note (Signed)
Addended by: Jeremy Johann on: 08/04/2019 01:42 PM   Modules accepted: Orders

## 2019-08-04 NOTE — Telephone Encounter (Signed)
Suanne Marker from [facility] confirmed that the fax was received.

## 2019-08-04 NOTE — Telephone Encounter (Signed)
Follow up   [facility] per Suanne Marker has questions about the Clearance. The patient is scheduled for surgery tomorrow please call.

## 2019-08-04 NOTE — Telephone Encounter (Signed)
Callback pool. Please manually fax the cardiac clearance office note from today by Truitt Merle to: [phone] attn: [email or web address].   Also please call Suanne Marker of [facility] to confirm they have received the clearance. Her phone number is [phone]

## 2019-08-04 NOTE — Telephone Encounter (Signed)
Called the requesting office of [facility] and spoke with Suanne Marker to see if the fax was received. Suanne Marker stated that she still has not received the fax. I informed her that the note from today was faxed via [place]. I let her know that I will have a co-worker who is in office manually fax over the note from today and to confirm the fax number which is [phone] and that I will give her a call back to make sure the fax was received. She thank me for following up and will be on the look out for the fax.

## 2019-08-04 NOTE — Patient Instructions (Addendum)
After Visit Summary:  We will be checking the following labs today - NONE   Medication Instructions:    Continue with your current medicines.    If you need a refill on your cardiac medications before your next appointment, please call your pharmacy.     Testing/Procedures To Be Arranged:  N/A  Follow-Up:   See Dr. Gwenlyn Found in about 6 months - You will receive a reminder letter in the mail two months in advance. If you don't receive a letter, please call our office to schedule the follow-up appointment.      At [facility], you and your health needs are our priority.  As part of our continuing mission to provide you with exceptional heart care, we have created designated Provider Care Teams.  These Care Teams include your primary Cardiologist (physician) and Advanced Practice Providers (APPs -  Physician Assistants and Nurse Practitioners) who all work together to provide you with the care you need, when you need it.  Special Instructions:  . Stay safe, stay home, wash your hands for at least 20 seconds and wear a mask when out in public.  . It was good to talk with you today.  . I will send a note to Dr. Burney Gauze and Dr. Ronnald Ramp . Total smoking cessation is needed.  Marland Kitchen Keep up the good work with losing weight.   Call the [place] office at [phone] if you have any questions, problems or concerns.

## 2019-08-05 ENCOUNTER — Other Ambulatory Visit: Payer: Self-pay

## 2019-08-07 ENCOUNTER — Other Ambulatory Visit: Payer: Self-pay

## 2019-08-07 ENCOUNTER — Encounter (HOSPITAL_COMMUNITY)
Admission: RE | Admit: 2019-08-07 | Discharge: 2019-08-07 | Disposition: A | Discharge status: Home / Self Care | Payer: Medicare Other | Source: Ambulatory Visit | Attending: Internal Medicine | Admitting: Internal Medicine

## 2019-08-07 DIAGNOSIS — E876 Hypokalemia: Secondary | ICD-10-CM | POA: Diagnosis present

## 2019-08-07 MED ORDER — TECHNETIUM TC 99M SESTAMIBI GENERIC - CARDIOLITE
24.7000 | Freq: Once | INTRAVENOUS | Status: AC | PRN
  Administered 2019-08-07: 24.7 via INTRAVENOUS

## 2019-08-10 ENCOUNTER — Ambulatory Visit
Admission: RE | Admit: 2019-08-10 | Discharge: 2019-08-10 | Disposition: A | Discharge status: Home / Self Care | Payer: Medicare Other | Source: Ambulatory Visit | Attending: Primary Care | Admitting: Primary Care

## 2019-08-10 DIAGNOSIS — R911 Solitary pulmonary nodule: Secondary | ICD-10-CM

## 2019-08-14 ENCOUNTER — Other Ambulatory Visit: Payer: Self-pay | Admitting: *Deleted

## 2019-08-14 NOTE — Progress Notes (Signed)
Please let patient know CT chest showed stable 12 mm left lower lobe pulmonary nodule. Recommend continued follow-up with chest CT wo contrast in 6 months (please order)

## 2019-09-01 ENCOUNTER — Other Ambulatory Visit: Payer: Self-pay | Admitting: Neurological Surgery

## 2019-09-14 NOTE — Pre-Procedure Instructions (Signed)
SHON MANSOURI  09/14/2019      [id number] [address] [place] [address] Phone: [phone] Fax: [phone]  [facility] [place], [place] - [address] [place] [address] Phone: [phone] Fax: [phone]    Your procedure is scheduled on September 18, 2019.  Report to [facility] Entrance "A" at 745 AM.  Call this number if you have problems the morning of surgery:  [phone]   Remember:  Do not eat or drink after midnight.   Take these medicines the morning of surgery with A SIP OF WATER  Albuterol inhaler-if needed-bring with you Albuterol nebulizer -if needed Allopurinol (zyloprim) Symbicort inhaler Carvedilol (coreg) Cetrizine (zyrtec) Hydroxyzine (atarax) Pantoprazole (protonix) Levothyroxine (synthroid) Tizanidine (zanaflex) Eye drops-if needed Fluticasone (flonase) nasal spray-if needed Oxycodone-acetaminophen (percocet)-if needed  7 days prior to surgery STOP taking any Aspirin (unless otherwise instructed by your surgeon), Aleve, Naproxen, Ibuprofen, Motrin, Advil, Goody's, BC's, all herbal medications, fish oil, and all vitamins  Follow your surgeon's instructions on when to hold/resume Plavix.  If no instructions were given call the office to determine how they would like to you take Plavix   WHAT DO I DO ABOUT MY DIABETES MEDICATION?  Do not take oral diabetes medicines (pills) the morning of surgery.   . THE NIGHT BEFORE SURGERY, take 24 units of Humalog 75/25 insulin (70% of normal dose).       . THE MORNING OF SURGERY, take NO Humalog insulin.  . The day of surgery, do not take other diabetes injectables, including Byetta (exenatide), Bydureon (exenatide ER), Victoza (liraglutide), or Trulicity (dulaglutide).    Reviewed and Endorsed by [facility] Patient Education  Committee, August 2015   How to Manage Your Diabetes Before and After Surgery  Why is it important to control my blood sugar before and after surgery? . Improving blood sugar levels before and after surgery helps healing and can limit problems. . A way of improving blood sugar control is eating a healthy diet by: o  Eating less sugar and carbohydrates o  Increasing activity/exercise o  Talking with your doctor about reaching your blood sugar goals . High blood sugars (greater than 180 mg/dL) can raise your risk of infections and slow your recovery, so you will need to focus on controlling your diabetes during the weeks before surgery. . Make sure that the doctor who takes care of your diabetes knows about your planned surgery including the date and location.  How do I manage my blood sugar before surgery? . Check your blood sugar at least 4 times a day, starting 2 days before surgery, to make sure that the level is not too high or low. o Check your blood sugar the morning of your surgery when you wake up and every 2 hours until you get to the Short Stay unit. . If your blood sugar is less than 70 mg/dL, you will need to treat for low blood sugar: o Do not take insulin. o Treat a low blood sugar (less than 70 mg/dL) with  cup of clear juice (cranberry or apple), 4 glucose tablets, OR glucose gel. Recheck blood sugar in 15 minutes after treatment (to make sure it is greater than 70 mg/dL). If your blood sugar is not greater than 70 mg/dL on recheck, call [phone] o  for further instructions. Marland Kitchen  Report your blood sugar to the short stay nurse when you get to Short Stay.  . If you are admitted to the hospital after surgery: o Your blood sugar will be checked by the staff and you will probably be given insulin after surgery (instead of oral diabetes medicines) to make sure you have good blood sugar levels. o The goal for blood sugar control after surgery is 80-180 mg/dL  [facility]-  Preparing For Surgery  Before surgery, you can play an important role. Because skin is not sterile, your skin needs to be as free of germs as possible. You can reduce the number of germs on your skin by washing with CHG (chlorahexidine gluconate) Soap before surgery.  CHG is an antiseptic cleaner which kills germs and bonds with the skin to continue killing germs even after washing.    Oral Hygiene is also important to reduce your risk of infection.  Remember - BRUSH YOUR TEETH THE MORNING OF SURGERY WITH YOUR REGULAR TOOTHPASTE  Please do not use if you have an allergy to CHG or antibacterial soaps. If your skin becomes reddened/irritated stop using the CHG.  Do not shave (including legs and underarms) for at least 48 hours prior to first CHG shower. It is OK to shave your face.  Please follow these instructions carefully.   1. Shower the NIGHT BEFORE SURGERY and the MORNING OF SURGERY with CHG.   2. If you chose to wash your hair, wash your hair first as usual with your normal shampoo.  3. After you shampoo, rinse your hair and body thoroughly to remove the shampoo.  4. Use CHG as you would any other liquid soap. You can apply CHG directly to the skin and wash gently with a scrungie or a clean washcloth.   5. Apply the CHG Soap to your body ONLY FROM THE NECK DOWN.  Do not use on open wounds or open sores. Avoid contact with your eyes, ears, mouth and genitals (private parts). Wash Face and genitals (private parts)  with your normal soap.  6. Wash thoroughly, paying special attention to the area where your surgery will be performed.  7. Thoroughly rinse your body with warm water from the neck down.  8. DO NOT shower/wash with your normal soap after using and rinsing off the CHG Soap.  9. Pat yourself dry with a CLEAN TOWEL.  10. Wear CLEAN PAJAMAS to bed the night before surgery, wear comfortable clothes the morning of surgery  11. Place CLEAN SHEETS on your bed the night of your  first shower and DO NOT SLEEP WITH PETS.  Day of Surgery: Shower as above Do not apply any deodorants/lotions.  Please wear clean clothes to the hospital/surgery center.   Remember to brush your teeth WITH YOUR REGULAR TOOTHPASTE.   Do not wear jewelry, make-up or nail polish.  Do not wear lotions, powders, or perfumes, or deodorant.  Do not shave 48 hours prior to surgery.   Do not bring valuables to the hospital.  [facility] is not responsible for any belongings or valuables.  Contacts, dentures or bridgework may not be worn into surgery.  Leave your suitcase in the car.  After surgery it may be brought to your room.  For patients admitted to the hospital, discharge time will be determined by your treatment team.  Patients discharged the day of surgery will not be allowed to drive home.   Please read over the following fact sheets that you were given.

## 2019-09-15 ENCOUNTER — Other Ambulatory Visit: Payer: Self-pay

## 2019-09-15 ENCOUNTER — Encounter (HOSPITAL_COMMUNITY)
Admission: RE | Admit: 2019-09-15 | Discharge: 2019-09-15 | Disposition: A | Discharge status: Home / Self Care | Payer: Medicare Other | Source: Ambulatory Visit | Attending: Neurological Surgery | Admitting: Neurological Surgery

## 2019-09-15 ENCOUNTER — Other Ambulatory Visit (HOSPITAL_COMMUNITY)
Admission: RE | Admit: 2019-09-15 | Discharge: 2019-09-15 | Disposition: A | Discharge status: Home / Self Care | Payer: Medicare Other | Source: Ambulatory Visit | Attending: Neurological Surgery | Admitting: Neurological Surgery

## 2019-09-15 ENCOUNTER — Encounter (HOSPITAL_COMMUNITY): Payer: Self-pay

## 2019-09-15 DIAGNOSIS — Z01818 Encounter for other preprocedural examination: Secondary | ICD-10-CM | POA: Insufficient documentation

## 2019-09-15 DIAGNOSIS — M4310 Spondylolisthesis, site unspecified: Secondary | ICD-10-CM | POA: Insufficient documentation

## 2019-09-15 HISTORY — DX: Personal history of other diseases of the digestive system: Z87.19

## 2019-09-15 HISTORY — DX: Chronic obstructive pulmonary disease, unspecified: J44.9

## 2019-09-15 LAB — GLUCOSE, CAPILLARY: Glucose-Capillary: 169 mg/dL — ABNORMAL HIGH (ref 70–99)

## 2019-09-15 LAB — BASIC METABOLIC PANEL
Anion gap: 17 — ABNORMAL HIGH (ref 5–15)
BUN: 127 mg/dL — ABNORMAL HIGH (ref 8–23)
CO2: 29 mmol/L (ref 22–32)
Calcium: 9.6 mg/dL (ref 8.9–10.3)
Chloride: 92 mmol/L — ABNORMAL LOW (ref 98–111)
Creatinine, Ser: 3.08 mg/dL — ABNORMAL HIGH (ref 0.44–1.00)
GFR calc Af Amer: 17 mL/min — ABNORMAL LOW (ref >60)
GFR calc non Af Amer: 15 mL/min — ABNORMAL LOW (ref >60)
Glucose, Bld: 167 mg/dL — ABNORMAL HIGH (ref 70–99)
Potassium: 3.3 mmol/L — ABNORMAL LOW (ref 3.5–5.1)
Sodium: 138 mmol/L (ref 135–145)

## 2019-09-15 LAB — CBC WITH DIFFERENTIAL/PLATELET
Abs Immature Granulocytes: 0.07 10*3/uL (ref 0.00–0.07)
Basophils Absolute: 0 10*3/uL (ref 0.0–0.1)
Basophils Relative: 0 %
Eosinophils Absolute: 0.1 10*3/uL (ref 0.0–0.5)
Eosinophils Relative: 1 %
HCT: 34.9 % — ABNORMAL LOW (ref 36.0–46.0)
Hemoglobin: 10.6 g/dL — ABNORMAL LOW (ref 12.0–15.0)
Immature Granulocytes: 1 %
Lymphocytes Relative: 17 %
Lymphs Abs: 1.7 10*3/uL (ref 0.7–4.0)
MCH: 24.7 pg — ABNORMAL LOW (ref 26.0–34.0)
MCHC: 30.4 g/dL (ref 30.0–36.0)
MCV: 81.4 fL (ref 80.0–100.0)
Monocytes Absolute: 0.7 10*3/uL (ref 0.1–1.0)
Monocytes Relative: 7 %
Neutro Abs: 7.5 10*3/uL (ref 1.7–7.7)
Neutrophils Relative %: 74 %
Platelets: 384 10*3/uL (ref 150–400)
RBC: 4.29 MIL/uL (ref 3.87–5.11)
RDW: 19.8 % — ABNORMAL HIGH (ref 11.5–15.5)
WBC: 10 10*3/uL (ref 4.0–10.5)
nRBC: 0 % (ref 0.0–0.2)

## 2019-09-15 LAB — TYPE AND SCREEN
ABO/RH(D): O POS
Antibody Screen: NEGATIVE

## 2019-09-15 LAB — SURGICAL PCR SCREEN
MRSA, PCR: NEGATIVE
Staphylococcus aureus: POSITIVE — AB

## 2019-09-15 LAB — HEMOGLOBIN A1C
Hgb A1c MFr Bld: 7.6 % — ABNORMAL HIGH (ref 4.8–5.6)
Mean Plasma Glucose: 171.42 mg/dL

## 2019-09-15 LAB — PROTIME-INR
INR: 1.1 (ref 0.8–1.2)
Prothrombin Time: 13.6 seconds (ref 11.4–15.2)

## 2019-09-15 NOTE — Progress Notes (Addendum)
PCP - [name], MD Cardiologist - Quay Burow, MD Rehab- Suella Broad, MD Pulmonologist- Kara Mead, MD Nephrologist- Jannifer Hick, MD  PPM/ICD - Denies  Chest x-ray - 09/15/2019 EKG - 09/15/2019 Stress Test -Positive stress test 12/2013 & 06/23/2014 ECHO - 03/28/2019 Cardiac Cath - 03/2019  Sleep Study - Yes CPAP - Denies  Fasting Blood Sugar - 120-150 Checks Blood Sugar 2 times a day  Blood Thinner Instructions: Per patient, stop plavix 7 days prior to sx. Last dose 09/10/2019 Aspirin Instructions: N/A  ERAS Protcol - N/A PRE-SURGERY Ensure or G2- N/A  COVID TEST- 09/15/2019   Anesthesia review: Yes, cardiac hx  Patient denies shortness of breath, fever, cough and chest pain at PAT appointment  Coronavirus Screening Have you experienced the following symptoms:  Cough yes/no: No Fever (>100.58F)  yes/no: No Runny nose yes/no: No Sore throat yes/no: No Difficulty breathing/shortness of breath  yes/no: No  Have you or a family member traveled in the last 14 days and where? yes/no: No   If the patient indicates "YES" to the above questions, their PAT will be rescheduled to limit the exposure to others and, the surgeon will be notified. THE PATIENT WILL NEED TO BE ASYMPTOMATIC FOR 14 DAYS.   If the patient is not experiencing any of these symptoms, the PAT nurse will instruct them to NOT bring anyone with them to their appointment since they may have these symptoms or traveled as well.   Please remind your patients and families that hospital visitation restrictions are in effect and the importance of the restrictions.    All instructions explained to the patient, with a verbal understanding of the material. Patient agrees to go over the instructions while at home for a better understanding. Patient also instructed to self quarantine after being tested for COVID-19. The opportunity to ask questions was provided.

## 2019-09-16 LAB — NOVEL CORONAVIRUS, NAA (HOSP ORDER, SEND-OUT TO REF LAB; TAT 18-24 HRS): SARS-CoV-2, NAA: NOT DETECTED

## 2019-09-16 NOTE — Anesthesia Preprocedure Evaluation (Addendum)
Anesthesia Evaluation  Patient identified by MRN, date of birth, ID band Patient awake    Reviewed: Allergy & Precautions, NPO status , Patient's Chart, lab work & pertinent test results  Airway Mallampati: II  TM Distance: >3 FB Neck ROM: Full    Dental  (+) Upper Dentures, Dental Advisory Given   Pulmonary asthma , sleep apnea , COPD,  COPD inhaler, Current Smoker and Patient abstained from smoking.,    breath sounds clear to auscultation       Cardiovascular hypertension, Pt. on home beta blockers + Peripheral Vascular Disease and +CHF   Rhythm:Regular Rate:Normal     Neuro/Psych  Headaches, TIAnegative psych ROS   GI/Hepatic Neg liver ROS, hiatal hernia, GERD  Medicated,  Endo/Other  diabetes, Type 2, Insulin DependentHypothyroidism   Renal/GU ESRFRenal disease     Musculoskeletal  (+) Arthritis ,   Abdominal (+) + obese,   Peds  Hematology   Anesthesia Other Findings - HLD  Reproductive/Obstetrics                           Anesthesia Physical Anesthesia Plan  ASA: III  Anesthesia Plan: General   Post-op Pain Management:    Induction: Intravenous  PONV Risk Score and Plan: 3 and Ondansetron, Dexamethasone and Midazolam  Airway Management Planned: Oral ETT  Additional Equipment: None  Intra-op Plan:   Post-operative Plan: Extubation in OR  Informed Consent: I have reviewed the patients History and Physical, chart, labs and discussed the procedure including the risks, benefits and alternatives for the proposed anesthesia with the patient or authorized representative who has indicated his/her understanding and acceptance.     Dental advisory given  Plan Discussed with: CRNA  Anesthesia Plan Comments: (See PAT note written 09/16/2019 by Myra Gianotti, PA-C. She has cardiology and nephrology input.  )      Anesthesia Quick Evaluation

## 2019-09-16 NOTE — Progress Notes (Signed)
Anesthesia Chart Review:  Case: [id number] Date/Time: 09/18/19 0930   Procedure: PLIF - L4-L5 (N/A Back)   Anesthesia type: General   Pre-op diagnosis: Spondylolisthesis   Location: [facility] OR ROOM 46 / [place] OR   Surgeon: Eustace Moore, MD      DISCUSSION: Patient is a 68 year old female scheduled for the above procedure.  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 BMI is consistent with morbid obesity.  - Patient was evaluated at [facility] by Truitt Merle, NP on 08/04/19 for follow-up and preoperative evaluation for upcoming wrist and back surgeries. She wrote, "she will be at moderate risk given her multiple co-morbidities. She has no active chest pain at this time. Her breathing seems stable.Marland KitchenMarland KitchenShe has had prior false + Myoview due to body habitus. I do not think further testing is warranted, nor will it reduce her risk. Plavix should be addressed by prescribing provider."    Reports she was advised to hold Plavix 7 days prior to surgery, last dose 09/10/2019. Cardiology notes indicate that she in on Plavix for either prior TIA and/or for PAD.   - Nephrologist Corliss Parish, MD (covering for Dr. [name] while on leave) classified patient at "Moderate Risk" and added that she has "moderately advanced CKD and has tendency for volume overload if is inpatient nephrology would be happy to help with issues". 09/15/19 Creatinine  elevated at 3.08, BUN 127, eGRF 17. Baseline Cr 2.0-2.4 prior to 10/2018 with peak up to 3.36 10/28/18. From 11/2018-03/2019 labs in [facility] show a range of Cr from 1.96-2.80, but primarily ~ 2.2-2.4. I forwarded PAT labs to Dr. Moshe Cipro to review. She felt patient was likely dry and advised to hold metolazone and decrease torsemide to 40 mg BID for now. She will have her staff notify the patient. She did not recommend repeating labs prior to surgery--patient had been seen at their office on 09/11/19 by Penninger, Ria Comment, PA-C with Cr 2.76, BUN 117, eGFR 20.      09/15/2019 presurgical COVID-19 test negative.  Based on currently available information I would anticipate that she could proceed as planned if no acute changes.   VS: BP 135/81   Pulse 76   Temp 37.1 C (Oral)   Resp 16   Ht '5\' 3"'  (1.6 m)   Wt 110.7 kg   SpO2 93%   BMI 43.22 kg/m    PROVIDERS: - PCP: [name], MD - Cardiologist: Quay Burow, MD. Last evaluation with Truitt Merle, NP on 08/04/19.  - HF Cardiologist: Loralie Champagne, MD. Last evaluation 03/2019 during her acute on chronic CHF admission with [facility] with "Overall low filling pressures".  Had mild elevation in troponin in setting of heart failure and renal dysfunction, and not felt consistent with ACS. - Rehab: Suella Broad, MD - Pulmonologist: Kara Mead, MD. Last evaluation with Geraldo Pitter, NP on 01/29/19. - Nephrologist: Jannifer Hick, MD. Last evaluation was on 11/11/18 by Jen Mow, PA-C.   LABS: Preoperative labs noted. See DISCUSSION.  (all labs ordered are listed, but only abnormal results are displayed)  Labs Reviewed  SURGICAL PCR SCREEN -  Abnormal; Notable for the following components:      Result Value   Staphylococcus aureus POSITIVE (*)    All other components within normal limits  GLUCOSE, CAPILLARY - Abnormal; Notable for the following components:   Glucose-Capillary 169 (*)    All other components within normal limits  BASIC  METABOLIC PANEL - Abnormal; Notable for the following components:   Potassium 3.3 (*)    Chloride 92 (*)    Glucose, Bld 167 (*)    BUN 127 (*)    Creatinine, Ser 3.08 (*)    GFR calc non Af Amer 15 (*)    GFR calc Af Amer 17 (*)    Anion gap 17 (*)    All other components within normal limits  CBC WITH DIFFERENTIAL/PLATELET - Abnormal; Notable for the following components:   Hemoglobin 10.6 (*)    HCT 34.9 (*)    MCH 24.7 (*)    RDW 19.8 (*)    All other components within normal limits  HEMOGLOBIN A1C - Abnormal; Notable for the following components:   Hgb A1c MFr Bld 7.6 (*)    All other components within normal limits  PROTIME-INR  TYPE AND SCREEN   Lab Results  Component Value Date   CREATININE 3.08 (H) 09/15/2019   CREATININE 2.20 (H) 04/08/2019   CREATININE 2.34 (H) 03/21/2019     IMAGES: CXR 09/15/19: FINDINGS: Cardiac shadow is stable. The known left lower lobe nodule is again seen and stable. No focal infiltrate or sizable effusion is seen. Degenerative changes of the thoracic spine are noted. IMPRESSION: No acute abnormality noted.  Stable chronic changes as described.  CT Chest 08/10/19: IMPRESSION: - Stable 12 mm left lower lobe pulmonary nodule. Recommend continued follow-up with chest CT in 6 months or further evaluation with PET-CT. - No evidence of lymphadenopathy or pleural effusion. - Aortic Atherosclerosis (ICD10-I70.0). (Follow-up chest CT in 6 months per Geraldo Pitter, NP)  PET Scan 04/30/19: IMPRESSION: 1. No hypermetabolism in the 8 x 12 mm left lower lobe pulmonary nodule. Well differentiated or low-grade neoplasm can be poorly FDG avid. Close follow-up recommended. 2. Hepatic steatosis. 3. Bilateral renal cysts. 4.  Aortic Atherosclerois (ICD10-170.0)   EKG: 09/15/19: Normal sinus rhythm with sinus arrhythmia Left axis deviation Left bundle branch block No significant change since last tracing Confirmed by Croitoru, Mihai [phone])  on 09/15/2019 6:04:14 PM - LBBB is old   CV: [facility] 03/20/19: Findings: Hemodynamics (mmHg) RA mean 5 RV 25/1 PA 32/9, mean 17 PCWP mean 8  Oxygen saturations: PA 61% AO 96%  Cardiac Output (Fick) 5.61  Cardiac Index (Fick) 2.61   Echo 03/18/19: IMPRESSIONS  1. The left ventricle has mild-moderately reduced systolic function, with an ejection fraction of 40-45%. The cavity size was normal. Left ventricular diastolic Doppler parameters are consistent with impaired relaxation.  2. Left atrial size was not well visualized.  3. The aortic root and ascending aorta are normal in size and structure.  4. The interatrial septum was not well visualized.   [facility] 07/02/14: ANGIOGRAPHIC RESULTS:  1. Left main; normal  2. LAD; normal 3. Left circumflex; left dominant and normal.  4. Right coronary artery; nondominant and normal 5. Left ventriculography; RAO left ventriculogram was performed using  25 mL of Visipaque dye at 12 mL/second. The overall LVEF estimated  40-45 % Without wall motion abnormalities IMPRESSION:Ms. Shawhan has normal coronary arteries and mild LV dysfunction. She is left dominant. I believe her Myoview was false positive because of  her body habitus.     Past Medical History:  Diagnosis Date  . Anemia   . Arthritis   . Asthma   . Bronchitis   . CHF (congestive heart failure) ([place])   . Chronic kidney disease   . Chronic kidney disease   . Chronic pain syndrome   . COPD (chronic obstructive pulmonary disease) ([place])   . Cramping of feet   . Diabetes mellitus   . Dyspnea   . GERD (gastroesophageal reflux disease)   . Gout   . Headache   . History of hiatal hernia   . History of hip replacement, total   . History of transfusion   . Hx MRSA infection    abscess left groin  . Hx of cardiac cath 06/2014  . Hyperlipidemia   . Hypertension   . Hypothyroid   . 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 Morbidly obese ([place])   . Peripheral vascular disease ([place])    . Positive cardiac stress test 12/2013   anterior and lateral ischemia on Myoview  . Sleep apnea    UNABLE TO TOLERATE C PAP  . Stress incontinence   . Thyroid disease   . TIA (transient ischemic attack)     X2 NO RESIDUAL PROBLEMS    Past Surgical History:  Procedure Laterality Date  . ABDOMINAL HYSTERECTOMY    . COLON SURGERY  1995   DUE TO POLYP  . DILATION AND CURETTAGE OF UTERUS    . FOREARM FRACTURE SURGERY     Left arm  . HERNIA REPAIR     w/ mesh  . INCISION AND DRAINAGE ABSCESS Left 02/04/2013   Procedure: INCISION AND DRAINAGE LEFT BUTTOCK ABSCESS; INCISION AND DRAINAGE LEFT BREAST ABSCESS;  Surgeon: Harl Bowie, MD;  Location: [place];  Service: General;  Laterality: Left;  . INCISION AND DRAINAGE ABSCESS N/A 02/12/2013   Procedure: INCISION AND DEBRIDEMENT BUTTOCK WOUND ;  Surgeon: [name]. Georgette Dover, MD;  Location: [place];  Service: General;  Laterality: N/A;  . INCISION AND DRAINAGE ABSCESS N/A 02/14/2013   Procedure: INCISION AND DRAINAGE/DRESSING CHANGE;  Surgeon: Harl Bowie, MD;  Location: [place];  Service: General;  Laterality: N/A;  . INCISION AND DRAINAGE ABSCESS N/A 03/21/2015   Procedure: INCISION AND DRAINAGE PUBIC ABSCESS;  Surgeon: Excell Seltzer, MD;  Location: [facility];  Service: General;  Laterality: N/A;  . INCISION AND DRAINAGE PERIRECTAL ABSCESS Left 02/10/2013   Procedure: IRRIGATION AND DEBRIDEMENT OF BUTTOCK/PERINEAL ABSCESS;  Surgeon: [name]. Georgette Dover, MD;  Location: [place];  Service: General;  Laterality: Left;  . INCISION AND DRAINAGE PERIRECTAL ABSCESS N/A 02/16/2013   Procedure: IRRIGATION AND DEBRIDEMENT PERINEAL ABSCESS;  Surgeon: Zenovia Jarred, MD;  Location: [place];  Service: General;  Laterality: N/A;  . IRRIGATION AND DEBRIDEMENT ABSCESS N/A 02/06/2013   Procedure: IRRIGATION AND DEBRIDEMENT BUTTOCK ABSCESS AND DRESSING CHANGE;  Surgeon: Harl Bowie, MD;  Location: [place];  Service: General;  Laterality: N/A;  . IRRIGATION AND  DEBRIDEMENT ABSCESS Left 02/08/2013   Procedure: IRRIGATION AND DEBRIDEMENT ABSCESS/DRESSING CHANGE;  Surgeon: Gwenyth Ober, MD;  Location: [place];  Service: General;  Laterality: Left;  . JOINT REPLACEMENT  2010 / 2012   . LAPAROSCOPIC CHOLECYSTECTOMY    . LEFT HEART CATHETERIZATION WITH CORONARY ANGIOGRAM N/A 07/02/2014   Procedure: LEFT HEART CATHETERIZATION WITH CORONARY ANGIOGRAM;  Surgeon: Lorretta Harp, MD;  [facility] C[facility] LAB;  Service: Cardiovascular;  Laterality: N/A;  . LOWER EXTREMITY ANGIOGRAM  Right 04/20/2016   Procedure: Lower Extremity Angiogram;  Surgeon: Elam Dutch, MD;  Location: [place] CV LAB;  Service: Cardiovascular;  Laterality: Right;  . PERIPHERAL VASCULAR CATHETERIZATION N/A 04/20/2016   Procedure: Abdominal Aortogram;  Surgeon: Elam Dutch, MD;  Location: [place] CV LAB;  Service: Cardiovascular;  Laterality: N/A;  . PERIPHERAL VASCULAR CATHETERIZATION Right 04/20/2016   Procedure: Peripheral Vascular Intervention;  Surgeon: Elam Dutch, MD;  Location: [place] CV LAB;  Service: Cardiovascular;  Laterality: Right;  popiteal  . RIGHT HEART CATH N/A 10/27/2018   Procedure: RIGHT HEART CATH;  Surgeon: Larey Dresser, MD;  Location: [place] CV LAB;  Service: Cardiovascular;  Laterality: N/A;  . RIGHT HEART CATH N/A 03/20/2019   Procedure: RIGHT HEART CATH;  Surgeon: Larey Dresser, MD;  Location: [place] CV LAB;  Service: Cardiovascular;  Laterality: N/A;  . THYROIDECTOMY    . TOTAL HIP REVISION Right 08/11/2014   Procedure: RIGHT ACETABULAR REVISION;  Surgeon: Gearlean Alf, MD;  Location: [facility];  Service: Orthopedics;  Laterality: Right;  Marland Kitchen VASCULAR SURGERY      MEDICATIONS: . albuterol (PROAIR HFA) 108 (90 Base) MCG/ACT inhaler  . albuterol (PROVENTIL) (2.5 MG/3ML) 0.083% nebulizer solution  . allopurinol (ZYLOPRIM) 100 MG tablet  . B Complex Vitamins (VITAMIN B COMPLEX PO)  . budesonide-formoterol (SYMBICORT) 160-4.5  MCG/ACT inhaler  . carvedilol (COREG) 6.25 MG tablet  . cetirizine (ZYRTEC) 10 MG tablet  . Cholecalciferol (VITAMIN D) 50 MCG (2000 UT) CAPS  . cinacalcet (SENSIPAR) 30 MG tablet  . clopidogrel (PLAVIX) 75 MG tablet  . Dulaglutide 1.5 MG/0.5ML SOPN  . fenofibrate (TRICOR) 145 MG tablet  . fluticasone (FLONASE) 50 MCG/ACT nasal spray  . hydrOXYzine (ATARAX/VISTARIL) 25 MG tablet  . insulin lispro protamine-lispro (HUMALOG 75/25 MIX) (75-25) 100 UNIT/ML SUSP injection  . levothyroxine (SYNTHROID, LEVOTHROID) 150 MCG tablet  . metolazone (ZAROXOLYN) 5 MG tablet  . mirabegron ER (MYRBETRIQ) 50 MG TB24 tablet  . Multiple Vitamin (MULTIVITAMIN WITH MINERALS) TABS tablet  . Multiple Vitamin (MULTIVITAMIN) capsule  . oxyCODONE-acetaminophen (PERCOCET) 10-325 MG tablet  . pantoprazole (PROTONIX) 40 MG tablet  . polyvinyl alcohol (LIQUIFILM TEARS) 1.4 % ophthalmic solution  . Potassium Chloride ER 20 MEQ TBCR  . simvastatin (ZOCOR) 20 MG tablet  . tiZANidine (ZANAFLEX) 4 MG tablet  . torsemide (DEMADEX) 20 MG tablet   No current facility-administered medications for this encounter.      Myra Gianotti, PA-C Surgical Short Stay/Anesthesiology [facility] Phone [phone] [facility] Phone [phone] 09/16/2019 2:56 PM

## 2019-09-18 ENCOUNTER — Inpatient Hospital Stay (HOSPITAL_COMMUNITY)
Admission: RE | Admit: 2019-09-18 | Discharge: 2019-09-22 | DRG: 454 | Disposition: A | Discharge status: Home Health | Payer: Medicare Other | Attending: Neurological Surgery | Admitting: Neurological Surgery

## 2019-09-18 ENCOUNTER — Other Ambulatory Visit: Payer: Self-pay

## 2019-09-18 ENCOUNTER — Inpatient Hospital Stay (HOSPITAL_COMMUNITY): Payer: Medicare Other

## 2019-09-18 ENCOUNTER — Inpatient Hospital Stay (HOSPITAL_COMMUNITY): Payer: Medicare Other | Admitting: Vascular Surgery

## 2019-09-18 ENCOUNTER — Encounter (HOSPITAL_COMMUNITY)
Admission: RE | Disposition: A | Discharge status: Home / Self Care | Payer: Self-pay | Source: Home / Self Care | Attending: Neurological Surgery

## 2019-09-18 ENCOUNTER — Inpatient Hospital Stay (HOSPITAL_COMMUNITY): Payer: Medicare Other | Admitting: Anesthesiology

## 2019-09-18 ENCOUNTER — Encounter (HOSPITAL_COMMUNITY): Payer: Self-pay | Admitting: Neurological Surgery

## 2019-09-18 DIAGNOSIS — Z6841 Body Mass Index (BMI) 40.0 and over, adult: Secondary | ICD-10-CM

## 2019-09-18 DIAGNOSIS — E039 Hypothyroidism, unspecified: Secondary | ICD-10-CM | POA: Diagnosis present

## 2019-09-18 DIAGNOSIS — E785 Hyperlipidemia, unspecified: Secondary | ICD-10-CM | POA: Diagnosis present

## 2019-09-18 DIAGNOSIS — Z794 Long term (current) use of insulin: Secondary | ICD-10-CM | POA: Diagnosis not present

## 2019-09-18 DIAGNOSIS — Z7902 Long term (current) use of antithrombotics/antiplatelets: Secondary | ICD-10-CM

## 2019-09-18 DIAGNOSIS — N184 Chronic kidney disease, stage 4 (severe): Secondary | ICD-10-CM | POA: Diagnosis present

## 2019-09-18 DIAGNOSIS — M48061 Spinal stenosis, lumbar region without neurogenic claudication: Secondary | ICD-10-CM | POA: Diagnosis present

## 2019-09-18 DIAGNOSIS — E1122 Type 2 diabetes mellitus with diabetic chronic kidney disease: Secondary | ICD-10-CM | POA: Diagnosis present

## 2019-09-18 DIAGNOSIS — E1151 Type 2 diabetes mellitus with diabetic peripheral angiopathy without gangrene: Secondary | ICD-10-CM | POA: Diagnosis present

## 2019-09-18 DIAGNOSIS — G894 Chronic pain syndrome: Secondary | ICD-10-CM | POA: Diagnosis present

## 2019-09-18 DIAGNOSIS — I130 Hypertensive heart and chronic kidney disease with heart failure and stage 1 through stage 4 chronic kidney disease, or unspecified chronic kidney disease: Secondary | ICD-10-CM | POA: Diagnosis present

## 2019-09-18 DIAGNOSIS — Z8673 Personal history of transient ischemic attack (TIA), and cerebral infarction without residual deficits: Secondary | ICD-10-CM

## 2019-09-18 DIAGNOSIS — I509 Heart failure, unspecified: Secondary | ICD-10-CM | POA: Diagnosis present

## 2019-09-18 DIAGNOSIS — M4316 Spondylolisthesis, lumbar region: Secondary | ICD-10-CM | POA: Diagnosis present

## 2019-09-18 DIAGNOSIS — Z419 Encounter for procedure for purposes other than remedying health state, unspecified: Secondary | ICD-10-CM

## 2019-09-18 DIAGNOSIS — Z79890 Hormone replacement therapy (postmenopausal): Secondary | ICD-10-CM

## 2019-09-18 DIAGNOSIS — J449 Chronic obstructive pulmonary disease, unspecified: Secondary | ICD-10-CM | POA: Diagnosis present

## 2019-09-18 DIAGNOSIS — Z90710 Acquired absence of both cervix and uterus: Secondary | ICD-10-CM | POA: Diagnosis not present

## 2019-09-18 DIAGNOSIS — Z79899 Other long term (current) drug therapy: Secondary | ICD-10-CM | POA: Diagnosis not present

## 2019-09-18 DIAGNOSIS — F17210 Nicotine dependence, cigarettes, uncomplicated: Secondary | ICD-10-CM | POA: Diagnosis present

## 2019-09-18 DIAGNOSIS — Z981 Arthrodesis status: Secondary | ICD-10-CM

## 2019-09-18 DIAGNOSIS — L299 Pruritus, unspecified: Secondary | ICD-10-CM | POA: Diagnosis present

## 2019-09-18 DIAGNOSIS — K219 Gastro-esophageal reflux disease without esophagitis: Secondary | ICD-10-CM | POA: Diagnosis present

## 2019-09-18 DIAGNOSIS — N179 Acute kidney failure, unspecified: Secondary | ICD-10-CM

## 2019-09-18 DIAGNOSIS — Z20828 Contact with and (suspected) exposure to other viral communicable diseases: Secondary | ICD-10-CM | POA: Diagnosis present

## 2019-09-18 LAB — GLUCOSE, CAPILLARY
Glucose-Capillary: 113 mg/dL — ABNORMAL HIGH (ref 70–99)
Glucose-Capillary: 133 mg/dL — ABNORMAL HIGH (ref 70–99)
Glucose-Capillary: 169 mg/dL — ABNORMAL HIGH (ref 70–99)
Glucose-Capillary: 313 mg/dL — ABNORMAL HIGH (ref 70–99)

## 2019-09-18 SURGERY — POSTERIOR LUMBAR FUSION 1 LEVEL
Anesthesia: General | Site: Back

## 2019-09-18 MED ORDER — FENTANYL CITRATE (PF) 100 MCG/2ML IJ SOLN: 25.0000 ug | INTRAMUSCULAR | Status: DC | PRN

## 2019-09-18 MED ORDER — VITAMIN D 25 MCG (1000 UNIT) PO TABS
2000.0000 [IU] | ORAL_TABLET | Freq: Every day | ORAL | Status: DC
  Administered 2019-09-19 – 2019-09-22 (×4): 2000 [IU] via ORAL
  Filled 2019-09-18 (×4): qty 2

## 2019-09-18 MED ORDER — THROMBIN 5000 UNITS EX SOLR
CUTANEOUS | Status: AC
  Filled 2019-09-18: qty 5000

## 2019-09-18 MED ORDER — INSULIN ASPART 100 UNIT/ML ~~LOC~~ SOLN
0.0000 [IU] | Freq: Three times a day (TID) | SUBCUTANEOUS | Status: DC
  Administered 2019-09-18: 3 [IU] via SUBCUTANEOUS

## 2019-09-18 MED ORDER — THROMBIN 5000 UNITS EX SOLR
OROMUCOSAL | Status: DC | PRN
  Administered 2019-09-18: 5 mL via TOPICAL

## 2019-09-18 MED ORDER — LIDOCAINE 2% (20 MG/ML) 5 ML SYRINGE
INTRAMUSCULAR | Status: AC
  Filled 2019-09-18: qty 5

## 2019-09-18 MED ORDER — B COMPLEX-C PO TABS
1.0000 | ORAL_TABLET | Freq: Two times a day (BID) | ORAL | Status: DC
  Administered 2019-09-18 – 2019-09-22 (×8): 1 via ORAL
  Filled 2019-09-18 (×8): qty 1

## 2019-09-18 MED ORDER — LACTATED RINGERS IV SOLN: INTRAVENOUS | Status: DC

## 2019-09-18 MED ORDER — THROMBIN 20000 UNITS EX SOLR
CUTANEOUS | Status: DC | PRN
  Administered 2019-09-18: 20 mL via TOPICAL

## 2019-09-18 MED ORDER — FENTANYL CITRATE (PF) 100 MCG/2ML IJ SOLN
25.0000 ug | INTRAMUSCULAR | Status: DC | PRN
  Administered 2019-09-18 (×2): 25 ug via INTRAVENOUS

## 2019-09-18 MED ORDER — SODIUM CHLORIDE 0.9 % IV SOLN
INTRAVENOUS | Status: DC
  Administered 2019-09-18: 09:00:00 via INTRAVENOUS

## 2019-09-18 MED ORDER — ALLOPURINOL 100 MG PO TABS
100.0000 mg | ORAL_TABLET | Freq: Every day | ORAL | Status: DC
  Administered 2019-09-19 – 2019-09-22 (×4): 100 mg via ORAL
  Filled 2019-09-18 (×4): qty 1

## 2019-09-18 MED ORDER — TORSEMIDE 20 MG PO TABS
80.0000 mg | ORAL_TABLET | Freq: Every day | ORAL | Status: DC
  Administered 2019-09-19 – 2019-09-21 (×3): 80 mg via ORAL
  Filled 2019-09-18 (×3): qty 4

## 2019-09-18 MED ORDER — DEXAMETHASONE SODIUM PHOSPHATE 10 MG/ML IJ SOLN
INTRAMUSCULAR | Status: AC
  Filled 2019-09-18: qty 1

## 2019-09-18 MED ORDER — INSULIN ASPART 100 UNIT/ML ~~LOC~~ SOLN
0.0000 [IU] | SUBCUTANEOUS | Status: DC
  Administered 2019-09-18: 15 [IU] via SUBCUTANEOUS
  Administered 2019-09-19: 20 [IU] via SUBCUTANEOUS
  Administered 2019-09-19: 13:00:00 11 [IU] via SUBCUTANEOUS
  Administered 2019-09-19: 3 [IU] via SUBCUTANEOUS
  Administered 2019-09-19 – 2019-09-20 (×3): 11 [IU] via SUBCUTANEOUS
  Administered 2019-09-20 (×2): 15 [IU] via SUBCUTANEOUS
  Administered 2019-09-20: 3 [IU] via SUBCUTANEOUS
  Administered 2019-09-20: 4 [IU] via SUBCUTANEOUS
  Administered 2019-09-21: 15 [IU] via SUBCUTANEOUS
  Administered 2019-09-21: 7 [IU] via SUBCUTANEOUS
  Administered 2019-09-21: 15 [IU] via SUBCUTANEOUS
  Administered 2019-09-21: 3 [IU] via SUBCUTANEOUS
  Administered 2019-09-21: 7 [IU] via SUBCUTANEOUS
  Administered 2019-09-22 (×2): 3 [IU] via SUBCUTANEOUS

## 2019-09-18 MED ORDER — PROPOFOL 10 MG/ML IV BOLUS
INTRAVENOUS | Status: AC
  Filled 2019-09-18: qty 20

## 2019-09-18 MED ORDER — 0.9 % SODIUM CHLORIDE (POUR BTL) OPTIME
TOPICAL | Status: DC | PRN
  Administered 2019-09-18: 1000 mL

## 2019-09-18 MED ORDER — ACETAMINOPHEN 325 MG PO TABS
650.0000 mg | ORAL_TABLET | ORAL | Status: DC | PRN
  Filled 2019-09-18: qty 2

## 2019-09-18 MED ORDER — TORSEMIDE 20 MG PO TABS: 40.0000 mg | ORAL_TABLET | Freq: Two times a day (BID) | ORAL | Status: DC

## 2019-09-18 MED ORDER — CHLORHEXIDINE GLUCONATE CLOTH 2 % EX PADS: 6.0000 | MEDICATED_PAD | Freq: Once | CUTANEOUS | Status: DC

## 2019-09-18 MED ORDER — ACETAMINOPHEN 650 MG RE SUPP: 650.0000 mg | RECTAL | Status: DC | PRN

## 2019-09-18 MED ORDER — ROCURONIUM BROMIDE 10 MG/ML (PF) SYRINGE
PREFILLED_SYRINGE | INTRAVENOUS | Status: DC | PRN
  Administered 2019-09-18: 60 mg via INTRAVENOUS
  Administered 2019-09-18: 20 mg via INTRAVENOUS

## 2019-09-18 MED ORDER — FENTANYL CITRATE (PF) 100 MCG/2ML IJ SOLN
INTRAMUSCULAR | Status: DC | PRN
  Administered 2019-09-18: 50 ug via INTRAVENOUS
  Administered 2019-09-18: 75 ug via INTRAVENOUS

## 2019-09-18 MED ORDER — BUPIVACAINE HCL (PF) 0.25 % IJ SOLN
INTRAMUSCULAR | Status: DC | PRN
  Administered 2019-09-18: 5 mL

## 2019-09-18 MED ORDER — CINACALCET HCL 30 MG PO TABS
30.0000 mg | ORAL_TABLET | Freq: Every day | ORAL | Status: DC
  Administered 2019-09-18 – 2019-09-22 (×5): 30 mg via ORAL
  Filled 2019-09-18 (×6): qty 1

## 2019-09-18 MED ORDER — PROPOFOL 10 MG/ML IV BOLUS
INTRAVENOUS | Status: DC | PRN
  Administered 2019-09-18: 110 mg via INTRAVENOUS

## 2019-09-18 MED ORDER — ROCURONIUM BROMIDE 10 MG/ML (PF) SYRINGE
PREFILLED_SYRINGE | INTRAVENOUS | Status: AC
  Filled 2019-09-18: qty 10

## 2019-09-18 MED ORDER — HEPARIN SODIUM (PORCINE) 1000 UNIT/ML IJ SOLN
INTRAMUSCULAR | Status: DC | PRN
  Administered 2019-09-18: 5000 [IU]

## 2019-09-18 MED ORDER — VITAMIN B COMPLEX PO TABS: ORAL_TABLET | Freq: Two times a day (BID) | ORAL | Status: DC

## 2019-09-18 MED ORDER — FENTANYL CITRATE (PF) 250 MCG/5ML IJ SOLN
INTRAMUSCULAR | Status: AC
  Filled 2019-09-18: qty 5

## 2019-09-18 MED ORDER — HYDROMORPHONE HCL 1 MG/ML IJ SOLN: 0.2500 mg | INTRAMUSCULAR | Status: DC | PRN

## 2019-09-18 MED ORDER — MIDAZOLAM HCL 2 MG/2ML IJ SOLN
INTRAMUSCULAR | Status: AC
  Filled 2019-09-18: qty 2

## 2019-09-18 MED ORDER — PHENYLEPHRINE HCL-NACL 20-0.9 MG/250ML-% IV SOLN
INTRAVENOUS | Status: DC | PRN
  Administered 2019-09-18: 100 ug/min via INTRAVENOUS

## 2019-09-18 MED ORDER — POTASSIUM CHLORIDE IN NACL 20-0.9 MEQ/L-% IV SOLN: INTRAVENOUS | Status: DC

## 2019-09-18 MED ORDER — TIZANIDINE HCL 4 MG PO TABS
4.0000 mg | ORAL_TABLET | Freq: Two times a day (BID) | ORAL | Status: DC
  Administered 2019-09-18 – 2019-09-22 (×8): 4 mg via ORAL
  Filled 2019-09-18 (×8): qty 1

## 2019-09-18 MED ORDER — ALBUMIN HUMAN 5 % IV SOLN
INTRAVENOUS | Status: DC | PRN
  Administered 2019-09-18: 13:00:00 via INTRAVENOUS

## 2019-09-18 MED ORDER — LIDOCAINE 2% (20 MG/ML) 5 ML SYRINGE
INTRAMUSCULAR | Status: DC | PRN
  Administered 2019-09-18: 50 mg via INTRAVENOUS

## 2019-09-18 MED ORDER — PROMETHAZINE HCL 25 MG/ML IJ SOLN: 6.2500 mg | INTRAMUSCULAR | Status: DC | PRN

## 2019-09-18 MED ORDER — SENNA 8.6 MG PO TABS
1.0000 | ORAL_TABLET | Freq: Two times a day (BID) | ORAL | Status: DC
  Administered 2019-09-18 – 2019-09-22 (×8): 8.6 mg via ORAL
  Filled 2019-09-18 (×8): qty 1

## 2019-09-18 MED ORDER — ASPIRIN EC 81 MG PO TBEC
81.0000 mg | DELAYED_RELEASE_TABLET | Freq: Every day | ORAL | Status: DC
  Administered 2019-09-19 – 2019-09-22 (×4): 81 mg via ORAL
  Filled 2019-09-18 (×4): qty 1

## 2019-09-18 MED ORDER — ALBUTEROL SULFATE (2.5 MG/3ML) 0.083% IN NEBU
2.5000 mg | INHALATION_SOLUTION | Freq: Once | RESPIRATORY_TRACT | Status: AC
  Administered 2019-09-18: 09:00:00 2.5 mg via RESPIRATORY_TRACT
  Filled 2019-09-18: qty 3

## 2019-09-18 MED ORDER — TORSEMIDE 20 MG PO TABS
40.0000 mg | ORAL_TABLET | Freq: Every day | ORAL | Status: DC
  Administered 2019-09-18 – 2019-09-20 (×3): 40 mg via ORAL
  Filled 2019-09-18 (×3): qty 2

## 2019-09-18 MED ORDER — HEPARIN SODIUM (PORCINE) 1000 UNIT/ML IJ SOLN
INTRAMUSCULAR | Status: AC
  Filled 2019-09-18: qty 1

## 2019-09-18 MED ORDER — DEXAMETHASONE SODIUM PHOSPHATE 10 MG/ML IJ SOLN
10.0000 mg | Freq: Once | INTRAMUSCULAR | Status: AC
  Administered 2019-09-18: 10 mg via INTRAVENOUS
  Filled 2019-09-18: qty 1

## 2019-09-18 MED ORDER — PHENYLEPHRINE 40 MCG/ML (10ML) SYRINGE FOR IV PUSH (FOR BLOOD PRESSURE SUPPORT)
PREFILLED_SYRINGE | INTRAVENOUS | Status: DC | PRN
  Administered 2019-09-18: 160 ug via INTRAVENOUS
  Administered 2019-09-18: 120 ug via INTRAVENOUS
  Administered 2019-09-18: 200 ug via INTRAVENOUS
  Administered 2019-09-18: 120 ug via INTRAVENOUS
  Administered 2019-09-18: 80 ug via INTRAVENOUS
  Administered 2019-09-18: 120 ug via INTRAVENOUS

## 2019-09-18 MED ORDER — BUPIVACAINE HCL (PF) 0.25 % IJ SOLN
INTRAMUSCULAR | Status: AC
  Filled 2019-09-18: qty 30

## 2019-09-18 MED ORDER — EPHEDRINE SULFATE-NACL 50-0.9 MG/10ML-% IV SOSY
PREFILLED_SYRINGE | INTRAVENOUS | Status: DC | PRN
  Administered 2019-09-18: 15 mg via INTRAVENOUS
  Administered 2019-09-18: 20 mg via INTRAVENOUS

## 2019-09-18 MED ORDER — CHLORHEXIDINE GLUCONATE CLOTH 2 % EX PADS
6.0000 | MEDICATED_PAD | Freq: Every day | CUTANEOUS | Status: DC
  Administered 2019-09-22: 6 via TOPICAL

## 2019-09-18 MED ORDER — ALBUTEROL SULFATE (2.5 MG/3ML) 0.083% IN NEBU
2.5000 mg | INHALATION_SOLUTION | Freq: Four times a day (QID) | RESPIRATORY_TRACT | Status: DC | PRN
  Administered 2019-09-19: 2.5 mg via RESPIRATORY_TRACT
  Filled 2019-09-18: qty 3

## 2019-09-18 MED ORDER — LEVOTHYROXINE SODIUM 75 MCG PO TABS
150.0000 ug | ORAL_TABLET | Freq: Every day | ORAL | Status: DC
  Administered 2019-09-19 – 2019-09-22 (×4): 150 ug via ORAL
  Filled 2019-09-18 (×4): qty 2

## 2019-09-18 MED ORDER — OXYCODONE HCL 5 MG PO TABS
10.0000 mg | ORAL_TABLET | ORAL | Status: DC | PRN
  Administered 2019-09-18 – 2019-09-20 (×12): 10 mg via ORAL
  Filled 2019-09-18 (×12): qty 2

## 2019-09-18 MED ORDER — HYDROXYZINE HCL 25 MG PO TABS
25.0000 mg | ORAL_TABLET | Freq: Two times a day (BID) | ORAL | Status: DC
  Administered 2019-09-18 – 2019-09-22 (×8): 25 mg via ORAL
  Filled 2019-09-18 (×8): qty 1

## 2019-09-18 MED ORDER — MIRABEGRON ER 25 MG PO TB24
50.0000 mg | ORAL_TABLET | Freq: Every day | ORAL | Status: DC
  Administered 2019-09-18 – 2019-09-22 (×5): 50 mg via ORAL
  Filled 2019-09-18 (×5): qty 2

## 2019-09-18 MED ORDER — CEFAZOLIN SODIUM-DEXTROSE 2-4 GM/100ML-% IV SOLN
2.0000 g | Freq: Two times a day (BID) | INTRAVENOUS | Status: AC
  Administered 2019-09-18 – 2019-09-19 (×2): 2 g via INTRAVENOUS
  Filled 2019-09-18 (×2): qty 100

## 2019-09-18 MED ORDER — MIDAZOLAM HCL 2 MG/2ML IJ SOLN
INTRAMUSCULAR | Status: DC | PRN
  Administered 2019-09-18: 2 mg via INTRAVENOUS

## 2019-09-18 MED ORDER — PANTOPRAZOLE SODIUM 40 MG PO TBEC
40.0000 mg | DELAYED_RELEASE_TABLET | Freq: Every day | ORAL | Status: DC
  Administered 2019-09-19 – 2019-09-22 (×4): 40 mg via ORAL
  Filled 2019-09-18 (×4): qty 1

## 2019-09-18 MED ORDER — PHENOL 1.4 % MT LIQD: 1.0000 | OROMUCOSAL | Status: DC | PRN

## 2019-09-18 MED ORDER — ONDANSETRON HCL 4 MG/2ML IJ SOLN
INTRAMUSCULAR | Status: AC
  Filled 2019-09-18: qty 2

## 2019-09-18 MED ORDER — OXYCODONE HCL 5 MG PO TABS
ORAL_TABLET | ORAL | Status: AC
  Filled 2019-09-18: qty 2

## 2019-09-18 MED ORDER — SODIUM CHLORIDE 0.9% FLUSH: 3.0000 mL | INTRAVENOUS | Status: DC | PRN

## 2019-09-18 MED ORDER — SODIUM CHLORIDE 0.9% FLUSH
3.0000 mL | Freq: Two times a day (BID) | INTRAVENOUS | Status: DC
  Administered 2019-09-18 – 2019-09-22 (×6): 3 mL via INTRAVENOUS

## 2019-09-18 MED ORDER — FENTANYL CITRATE (PF) 100 MCG/2ML IJ SOLN
INTRAMUSCULAR | Status: AC
  Administered 2019-09-18: 50 ug via INTRAVENOUS
  Filled 2019-09-18: qty 2

## 2019-09-18 MED ORDER — MENTHOL 3 MG MT LOZG: 1.0000 | LOZENGE | OROMUCOSAL | Status: DC | PRN

## 2019-09-18 MED ORDER — ONDANSETRON HCL 4 MG PO TABS: 4.0000 mg | ORAL_TABLET | Freq: Four times a day (QID) | ORAL | Status: DC | PRN

## 2019-09-18 MED ORDER — ONDANSETRON HCL 4 MG/2ML IJ SOLN: 4.0000 mg | Freq: Four times a day (QID) | INTRAMUSCULAR | Status: DC | PRN

## 2019-09-18 MED ORDER — ACETAMINOPHEN 160 MG/5ML PO SOLN: 325.0000 mg | Freq: Once | ORAL | Status: DC | PRN

## 2019-09-18 MED ORDER — CELECOXIB 200 MG PO CAPS
200.0000 mg | ORAL_CAPSULE | ORAL | Status: DC
  Administered 2019-09-18 – 2019-09-19 (×2): 200 mg via ORAL
  Filled 2019-09-18 (×3): qty 1

## 2019-09-18 MED ORDER — ACETAMINOPHEN 325 MG PO TABS: 325.0000 mg | ORAL_TABLET | Freq: Once | ORAL | Status: DC | PRN

## 2019-09-18 MED ORDER — ARTHREX ANGEL - ACD-A SOLUTION (CHARTING ONLY) OPTIME
TOPICAL | Status: DC | PRN
  Administered 2019-09-18: 10 mL via TOPICAL

## 2019-09-18 MED ORDER — LACTATED RINGERS IV SOLN
INTRAVENOUS | Status: DC | PRN
  Administered 2019-09-18: 10:00:00 via INTRAVENOUS

## 2019-09-18 MED ORDER — ALBUTEROL SULFATE HFA 108 (90 BASE) MCG/ACT IN AERS: 2.0000 | INHALATION_SPRAY | Freq: Four times a day (QID) | RESPIRATORY_TRACT | Status: DC | PRN

## 2019-09-18 MED ORDER — SUGAMMADEX SODIUM 200 MG/2ML IV SOLN
INTRAVENOUS | Status: DC | PRN
  Administered 2019-09-18: 400 mg via INTRAVENOUS

## 2019-09-18 MED ORDER — MEPERIDINE HCL 25 MG/ML IJ SOLN: 6.2500 mg | INTRAMUSCULAR | Status: DC | PRN

## 2019-09-18 MED ORDER — ACETAMINOPHEN 10 MG/ML IV SOLN: 1000.0000 mg | Freq: Once | INTRAVENOUS | Status: DC | PRN

## 2019-09-18 MED ORDER — SODIUM CHLORIDE (PF) 0.9 % IJ SOLN
INTRAMUSCULAR | Status: DC | PRN
  Administered 2019-09-18: 5 mL

## 2019-09-18 MED ORDER — MOMETASONE FURO-FORMOTEROL FUM 200-5 MCG/ACT IN AERO
2.0000 | INHALATION_SPRAY | Freq: Two times a day (BID) | RESPIRATORY_TRACT | Status: DC
  Administered 2019-09-18 – 2019-09-22 (×8): 2 via RESPIRATORY_TRACT
  Filled 2019-09-18: qty 8.8

## 2019-09-18 MED ORDER — FENOFIBRATE 54 MG PO TABS
54.0000 mg | ORAL_TABLET | Freq: Every day | ORAL | Status: DC
  Administered 2019-09-18 – 2019-09-22 (×5): 54 mg via ORAL
  Filled 2019-09-18 (×5): qty 1

## 2019-09-18 MED ORDER — SODIUM CHLORIDE 0.9 % IV SOLN: 250.0000 mL | INTRAVENOUS | Status: DC

## 2019-09-18 MED ORDER — ACETAMINOPHEN 10 MG/ML IV SOLN
INTRAVENOUS | Status: AC
  Administered 2019-09-18: 1000 mg via INTRAVENOUS
  Filled 2019-09-18: qty 100

## 2019-09-18 MED ORDER — THROMBIN 20000 UNITS EX SOLR
CUTANEOUS | Status: AC
  Filled 2019-09-18: qty 20000

## 2019-09-18 MED ORDER — CEFAZOLIN SODIUM-DEXTROSE 2-4 GM/100ML-% IV SOLN
2.0000 g | INTRAVENOUS | Status: DC
  Filled 2019-09-18: qty 100

## 2019-09-18 MED ORDER — CARVEDILOL 6.25 MG PO TABS
9.2500 mg | ORAL_TABLET | Freq: Two times a day (BID) | ORAL | Status: DC
  Administered 2019-09-18 – 2019-09-22 (×8): 9.375 mg via ORAL
  Filled 2019-09-18 (×8): qty 1

## 2019-09-18 MED ORDER — MORPHINE SULFATE (PF) 2 MG/ML IV SOLN: 2.0000 mg | INTRAVENOUS | Status: DC | PRN

## 2019-09-18 MED ORDER — SODIUM CHLORIDE 0.9 % IV SOLN
INTRAVENOUS | Status: DC | PRN
  Administered 2019-09-18: 500 mL

## 2019-09-18 SURGICAL SUPPLY — 66 items
BAG DECANTER FOR FLEXI CONT (MISCELLANEOUS) ×2 IMPLANT
BASKET BONE COLLECTION (BASKET) ×2 IMPLANT
BENZOIN TINCTURE PRP APPL 2/3 (GAUZE/BANDAGES/DRESSINGS) ×2 IMPLANT
BLADE CLIPPER SURG (BLADE) IMPLANT
BUR MATCHSTICK NEURO 3.0 LAGG (BURR) ×2 IMPLANT
CANISTER SUCT 3000ML PPV (MISCELLANEOUS) ×2 IMPLANT
CARTRIDGE OIL MAESTRO DRILL (MISCELLANEOUS) ×1 IMPLANT
CONT SPEC 4OZ CLIKSEAL STRL BL (MISCELLANEOUS) ×2 IMPLANT
COVER BACK TABLE 60X90IN (DRAPES) ×2 IMPLANT
COVER WAND RF STERILE (DRAPES) ×2 IMPLANT
DERMABOND ADVANCED (GAUZE/BANDAGES/DRESSINGS) ×1
DERMABOND ADVANCED .7 DNX12 (GAUZE/BANDAGES/DRESSINGS) ×1 IMPLANT
DIFFUSER DRILL AIR PNEUMATIC (MISCELLANEOUS) ×2 IMPLANT
DRAPE C-ARM 42X72 X-RAY (DRAPES) ×4 IMPLANT
DRAPE LAPAROTOMY 100X72X124 (DRAPES) ×2 IMPLANT
DRAPE SURG 17X23 STRL (DRAPES) ×2 IMPLANT
DRSG OPSITE POSTOP 4X6 (GAUZE/BANDAGES/DRESSINGS) ×2 IMPLANT
DURAPREP 26ML APPLICATOR (WOUND CARE) ×2 IMPLANT
ELECT REM PT RETURN 9FT ADLT (ELECTROSURGICAL) ×2
ELECTRODE REM PT RTRN 9FT ADLT (ELECTROSURGICAL) ×1 IMPLANT
EVACUATOR 1/8 PVC DRAIN (DRAIN) ×2 IMPLANT
GAUZE 4X4 16PLY RFD (DISPOSABLE) IMPLANT
GLOVE BIO SURGEON STRL SZ 6 (GLOVE) ×6 IMPLANT
GLOVE BIO SURGEON STRL SZ 6.5 (GLOVE) ×6 IMPLANT
GLOVE BIO SURGEON STRL SZ7 (GLOVE) ×2 IMPLANT
GLOVE BIO SURGEON STRL SZ8 (GLOVE) ×4 IMPLANT
GLOVE BIOGEL PI IND STRL 6.5 (GLOVE) ×2 IMPLANT
GLOVE BIOGEL PI IND STRL 7.0 (GLOVE) ×1 IMPLANT
GLOVE BIOGEL PI INDICATOR 6.5 (GLOVE) ×2
GLOVE BIOGEL PI INDICATOR 7.0 (GLOVE) ×1
GLOVE SURG SS PI 7.5 STRL IVOR (GLOVE) ×2 IMPLANT
GLOVE SURG SS PI 8.0 STRL IVOR (GLOVE) ×2 IMPLANT
GOWN STRL REUS W/ TWL LRG LVL3 (GOWN DISPOSABLE) IMPLANT
GOWN STRL REUS W/ TWL XL LVL3 (GOWN DISPOSABLE) ×2 IMPLANT
GOWN STRL REUS W/TWL 2XL LVL3 (GOWN DISPOSABLE) IMPLANT
GOWN STRL REUS W/TWL LRG LVL3 (GOWN DISPOSABLE)
GOWN STRL REUS W/TWL XL LVL3 (GOWN DISPOSABLE) ×2
HEMOSTAT POWDER KIT SURGIFOAM (HEMOSTASIS) ×2 IMPLANT
KIT BASIN OR (CUSTOM PROCEDURE TRAY) ×2 IMPLANT
KIT BONE MRW ASP ANGEL CPRP (KITS) ×2 IMPLANT
KIT TURNOVER KIT B (KITS) ×2 IMPLANT
MILL MEDIUM DISP (BLADE) IMPLANT
NEEDLE HYPO 25X1 1.5 SAFETY (NEEDLE) ×2 IMPLANT
NS IRRIG 1000ML POUR BTL (IV SOLUTION) ×2 IMPLANT
OIL CARTRIDGE MAESTRO DRILL (MISCELLANEOUS) ×2
PACK LAMINECTOMY NEURO (CUSTOM PROCEDURE TRAY) ×2 IMPLANT
PAD ARMBOARD 7.5X6 YLW CONV (MISCELLANEOUS) IMPLANT
PUTTY DBM ALLOSYNC PURE 10CC (Putty) ×2 IMPLANT
ROD LORD LIPPED TI 5.5X40 (Rod) ×4 IMPLANT
SCREW CANC PA 6.5X40 (Screw) ×4 IMPLANT
SCREW CORT SHANK MOD 6.5X40 (Screw) ×4 IMPLANT
SCREW POLYAXIAL TULIP (Screw) ×4 IMPLANT
SET SCREW (Screw) ×4 IMPLANT
SET SCREW SPNE (Screw) ×4 IMPLANT
SPACER IDENTITI PS 10X9X25 10D (Spacer) ×4 IMPLANT
SPONGE LAP 4X18 RFD (DISPOSABLE) IMPLANT
SPONGE SURGIFOAM ABS GEL 100 (HEMOSTASIS) ×2 IMPLANT
STRIP CLOSURE SKIN 1/2X4 (GAUZE/BANDAGES/DRESSINGS) ×2 IMPLANT
SUT VIC AB 0 CT1 18XCR BRD8 (SUTURE) ×1 IMPLANT
SUT VIC AB 0 CT1 8-18 (SUTURE) ×1
SUT VIC AB 2-0 CP2 18 (SUTURE) ×2 IMPLANT
SUT VIC AB 3-0 SH 8-18 (SUTURE) ×4 IMPLANT
TOWEL GREEN STERILE (TOWEL DISPOSABLE) ×2 IMPLANT
TOWEL GREEN STERILE FF (TOWEL DISPOSABLE) ×2 IMPLANT
TRAY FOLEY MTR SLVR 16FR STAT (SET/KITS/TRAYS/PACK) ×2 IMPLANT
WATER STERILE IRR 1000ML POUR (IV SOLUTION) ×2 IMPLANT

## 2019-09-18 NOTE — Op Note (Signed)
09/18/2019  12:58 PM  PATIENT:  [name] [name]  68 y.o. 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 Decompressive lumbar laminectomy L4-5 requiring more work than would be required for a simple exposure of the disk for PLIF in order to adequately decompress the neural elements and address the spinal stenosis 2. Posterior lumbar interbody fusion L4-5 using porous titanium interbody cages packed with morcellized allograft and autograft soaked with a bone marrow aspirate obtained through a separate fascial incision over the right iliac crest 3. Posterior fixation L4-5 using Alphatec cortical pedicle screws.  4. Intertransverse arthrodesis L4-5 using morcellized autograft and allograft.  SURGEON:  Sherley Bounds, MD  ASSISTANTS: Dr. Venetia Constable  ANESTHESIA:  General  EBL: 200 ml  Total I/O In: -  Out: 967 [Urine:375; Blood:400]  BLOOD ADMINISTERED:none  DRAINS: none   INDICATION FOR PROCEDURE: This patient presented with back and bilateral leg pain. Imaging revealed dynamic spondylolisthesis with stenosis L4-5. The patient tried a reasonable attempt at conservative medical measures without relief. I recommended decompression and instrumented fusion to address the stenosis as well as the segmental  instability.  Patient understood the risks, benefits, and alternatives and potential outcomes and wished to proceed.  PROCEDURE DETAILS:  The patient was brought to the operating room. After induction of generalized endotracheal anesthesia the patient was rolled into the prone position on chest rolls and all pressure points were padded. The patient's lumbar region was cleaned and then prepped with DuraPrep and draped in the usual sterile fashion. Anesthesia was injected and then a dorsal midline incision was made and carried down to the lumbosacral fascia. The fascia was  opened and the paraspinous musculature was taken down in a subperiosteal fashion to expose L4-5. A self-retaining retractor was placed. Intraoperative fluoroscopy confirmed my level, and I started with placement of the L4 cortical pedicle screws. The pedicle screw entry zones were identified utilizing surface landmarks and  AP and lateral fluoroscopy. I scored the cortex with the high-speed drill and then used the hand drill to drill an upward and outward direction into the pedicle. I then tapped line to line. I then placed a 6.5 x 40 mm cortical pedicle screw into the pedicles of L4 bilaterally.  I then dissected in a suprafascial plane to expose the iliac crest.  Opened the fascia and we used a Jamshidi needle to extract 60 cc of bone marrow aspirate from the iliac crest with the assistance of my nurse practitioner.  This was then spun down by [facility] device and 2 to 4 cc of  BMAC was soaked on morselized allograft for later arthrodesis.  I dried the hole with Surgifoam and closed the fascia.  I then turned my attention to the decompression and complete lumbar laminectomies, hemi- facetectomies, and foraminotomies were performed at L4-5.  My nurse practitioner was directly involved in the decompression and exposure of the neural elements. the patient had significant spinal stenosis and this required more work than would be required for a simple exposure of the disc for posterior lumbar interbody fusion which would only require a limited laminotomy. Much more generous decompression and generous foraminotomy was undertaken in order to adequately decompress the neural elements and address the patient's leg pain. The yellow ligament was removed to expose the underlying dura and nerve roots, and generous foraminotomies were performed to adequately decompress the neural elements. Both the exiting and traversing nerve roots  were decompressed on both sides until a coronary dilator passed easily along the nerve roots. Once  the decompression was complete, I turned my attention to the posterior lower lumbar interbody fusion. The epidural venous vasculature was coagulated and cut sharply. Disc space was incised and the initial discectomy was performed with pituitary rongeurs. The disc space was distracted with sequential distractors to a height of 10 mm. We then used a series of scrapers and shavers to prepare the endplates for fusion. The midline was prepared with Epstein curettes. Once the complete discectomy was finished, we packed an appropriate sized interbody cage with local autograft and morcellized allograft, gently retracted the nerve root, and tapped the cage into position at L4-5.  The midline between the cages was packed with morselized autograft and allograft. We then turned our attention to the placement of the lower pedicle screws. The pedicle screw entry zones were identified utilizing surface landmarks and fluoroscopy. I drilled into each pedicle utilizing the hand drill, and tapped each pedicle with the appropriate tap. We palpated with a ball probe to assure no break in the cortex. We then placed 6.5 x 40 mm cortical pedicle screws into the pedicles bilaterally at L5.  My nurse practitioner assisted in placement of the pedicle screws.  We then decorticated the transverse processes and laid a mixture of morcellized autograft and allograft out over these to perform intertransverse arthrodesis at L4-5. We then placed lordotic rods into the multiaxial screw heads of the pedicle screws and locked these in position with the locking caps and anti-torque device. We then checked our construct with AP and lateral fluoroscopy. Irrigated with copious amounts of bacitracin-containing saline solution. Inspected the nerve roots once again to assure adequate decompression, lined to the dura with Gelfoam, placed powdered vancomycin into the wound, and then we closed the muscle and the fascia with 0 Vicryl. Closed the subcutaneous  tissues with 2-0 Vicryl and subcuticular tissues with 3-0 Vicryl. The skin was closed with benzoin and Steri-Strips. Dressing was then applied, the patient was awakened from general anesthesia and transported to the recovery room in stable condition. At the end of the procedure all sponge, needle and instrument counts were correct.   PLAN OF CARE: admit to inpatient  PATIENT DISPOSITION:  PACU - hemodynamically stable.   Delay start of Pharmacological VTE agent (>24hrs) due to surgical blood loss or risk of bleeding:  yes

## 2019-09-18 NOTE — Progress Notes (Signed)
Orthopedic Tech Progress Note Patient Details:  [name] [name] 11/09/1950 [id number] Called in order to [facility] for an extra large quick draw/ LSO Patient ID: [name] [name], female   DOB: 17-May-1951, 68 y.o.   MRN: [id number]   [name] [name] 09/18/2019, 4:21 PM

## 2019-09-18 NOTE — Progress Notes (Addendum)
Nurse received report from PACU nurse. Patient arrived to unit. Patient alert and oriented at this time. No telemetry orders, vitals signs stable. All orders released. Bed placed in lowest position with call light and telephone within reach. Will continue to monitor closely.  Gwendolyn Grant, RN

## 2019-09-18 NOTE — Progress Notes (Signed)
Patient's CBG was 313 mg/dL. Notified Dr.Thomas Ostergard and received an order for sliding scale insulin. Order implemented and will continue to monitor.

## 2019-09-18 NOTE — Transfer of Care (Signed)
Immediate Anesthesia Transfer of Care Note  Patient: [name] [name]  Procedure(s) Performed: Posterior Lumbar Interbody Fusion- Lumbar four-Lumbar five (N/A Back)  Patient Location: PACU  Anesthesia Type:General  Level of Consciousness: awake, alert  and oriented  Airway & Oxygen Therapy: Patient Spontanous Breathing and Patient connected to face mask oxygen  Post-op Assessment: Report given to RN and Post -op Vital signs reviewed and stable  Post vital signs: Reviewed and stable  Last Vitals:  Vitals Value Taken Time  BP 157/71 09/18/19 1325  Temp    Pulse 78 09/18/19 1325  Resp 13 09/18/19 1325  SpO2 100 % 09/18/19 1325  Vitals shown include unvalidated device data.  Last Pain:  Vitals:   09/18/19 0840  PainSc: 6          Complications: No apparent anesthesia complications

## 2019-09-18 NOTE — Anesthesia Procedure Notes (Signed)
Procedure Name: Intubation Date/Time: 09/18/2019 9:56 AM Performed by: Leonor Liv, CRNA Pre-anesthesia Checklist: Patient identified, Emergency Drugs available, Suction available and Patient being monitored Patient Re-evaluated:Patient Re-evaluated prior to induction Oxygen Delivery Method: Circle System Utilized Preoxygenation: Pre-oxygenation with 100% oxygen Induction Type: IV induction Ventilation: Mask ventilation without difficulty Laryngoscope Size: Mac and 3 Grade View: Grade I Tube type: Oral Tube size: 7.0 mm Number of attempts: 1 Airway Equipment and Method: Stylet and Oral airway Placement Confirmation: ETT inserted through vocal cords under direct vision,  positive ETCO2 and breath sounds checked- equal and bilateral Secured at: 21 cm Tube secured with: Tape Dental Injury: Teeth and Oropharynx as per pre-operative assessment

## 2019-09-18 NOTE — H&P (Signed)
Subjective: Patient is a 68 y.o. female admitted for dynamic instability with stenosis L4-5. Onset of symptoms was several months ago, gradually worsening since that time.  The pain is rated severe, and is located at the across the lower back and radiates to legs. The pain is described as aching and occurs all day. The symptoms have been progressive. Symptoms are exacerbated by exercise. MRI or CT showed dynamic instability L4-5 with spinal stenosis  Past Medical History:  Diagnosis Date  . Anemia   . Arthritis   . Asthma   . Bronchitis   . CHF (congestive heart failure) ([place])   . Chronic kidney disease   . Chronic kidney disease   . Chronic pain syndrome   . COPD (chronic obstructive pulmonary disease) ([place])   . Cramping of feet   . Diabetes mellitus   . Dyspnea   . GERD (gastroesophageal reflux disease)   . Gout   . Headache   . History of hiatal hernia   . History of hip replacement, total   . History of transfusion   . Hx MRSA infection    abscess left groin  . Hx of cardiac cath 06/2014  . Hyperlipidemia   . Hypertension   . Hypothyroid   . 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[place])   . Morbidly obese ([place])   . Peripheral vascular disease ([place])   . Positive cardiac stress test 12/2013   anterior and lateral ischemia on Myoview  . Sleep apnea    UNABLE TO TOLERATE C PAP  . Stress incontinence   . Thyroid disease   . TIA (transient ischemic attack)     X2 NO RESIDUAL PROBLEMS    Past Surgical History:  Procedure Laterality Date  . ABDOMINAL HYSTERECTOMY    . COLON SURGERY  1995   DUE TO POLYP  . DILATION AND CURETTAGE OF UTERUS    . FOREARM FRACTURE SURGERY     Left arm  . HERNIA REPAIR     w/ mesh  . INCISION AND DRAINAGE ABSCESS Left 02/04/2013   Procedure: INCISION AND DRAINAGE LEFT BUTTOCK ABSCESS; INCISION AND DRAINAGE LEFT BREAST ABSCESS;  Surgeon: Harl Bowie, MD;  Location: [facility];  Service: General;  Laterality: Left;  . INCISION AND DRAINAGE ABSCESS N/A  02/12/2013   Procedure: INCISION AND DEBRIDEMENT BUTTOCK WOUND ;  Surgeon: [name]. Georgette Dover, MD;  Location: [facility];  Service: General;  Laterality: N/A;  . INCISION AND DRAINAGE ABSCESS N/A 02/14/2013   Procedure: INCISION AND DRAINAGE/DRESSING CHANGE;  Surgeon: Harl Bowie, MD;  Location: [facility];  Service: General;  Laterality: N/A;  . INCISION AND DRAINAGE ABSCESS N/A 03/21/2015   Procedure: INCISION AND DRAINAGE PUBIC ABSCESS;  Surgeon: Excell Seltzer, MD;  Location: [facility];  Service: General;  Laterality: N/A;  . INCISION AND DRAINAGE PERIRECTAL ABSCESS Left 02/10/2013   Procedure: IRRIGATION AND DEBRIDEMENT OF BUTTOCK/PERINEAL ABSCESS;  Surgeon: [name]. Georgette Dover, MD;  Location: [facility];  Service: General;  Laterality: Left;  . INCISION AND DRAINAGE PERIRECTAL ABSCESS N/A 02/16/2013   Procedure: IRRIGATION AND DEBRIDEMENT PERINEAL ABSCESS;  Surgeon: Zenovia Jarred, MD;  Location: [place];  Service: General;  Laterality: N/A;  . IRRIGATION AND DEBRIDEMENT ABSCESS N/A 02/06/2013   Procedure: IRRIGATION AND DEBRIDEMENT BUTTOCK ABSCESS AND DRESSING CHANGE;  Surgeon: Harl Bowie, MD;  Location: [facility];  Service: General;  Laterality: N/A;  . IRRIGATION AND DEBRIDEMENT ABSCESS Left 02/08/2013   Procedure: IRRIGATION AND DEBRIDEMENT ABSCESS/DRESSING CHANGE;  Surgeon: Kathryne Eriksson  Hulen Skains, MD;  Location: [place];  Service: General;  Laterality: Left;  . JOINT REPLACEMENT  2010 / 2012   . LAPAROSCOPIC CHOLECYSTECTOMY    . LEFT HEART CATHETERIZATION WITH CORONARY ANGIOGRAM N/A 07/02/2014   Procedure: LEFT HEART CATHETERIZATION WITH CORONARY ANGIOGRAM;  Surgeon: Lorretta Harp, MD;  [facility] C[facility];  Service: Cardiovascular;  Laterality: N/A;  . LOWER EXTREMITY ANGIOGRAM Right 04/20/2016   Procedure: Lower Extremity Angiogram;  Surgeon: Elam Dutch, MD;  Location: [place] CV LAB;  Service: Cardiovascular;  Laterality: Right;  . PERIPHERAL VASCULAR CATHETERIZATION N/A 04/20/2016   Procedure:  Abdominal Aortogram;  Surgeon: Elam Dutch, MD;  Location: [place] CV LAB;  Service: Cardiovascular;  Laterality: N/A;  . PERIPHERAL VASCULAR CATHETERIZATION Right 04/20/2016   Procedure: Peripheral Vascular Intervention;  Surgeon: Elam Dutch, MD;  Location: [place] CV LAB;  Service: Cardiovascular;  Laterality: Right;  popiteal  . RIGHT HEART CATH N/A 10/27/2018   Procedure: RIGHT HEART CATH;  Surgeon: Larey Dresser, MD;  Location: [place] CV LAB;  Service: Cardiovascular;  Laterality: N/A;  . RIGHT HEART CATH N/A 03/20/2019   Procedure: RIGHT HEART CATH;  Surgeon: Larey Dresser, MD;  Location: [facility] CV LAB;  Service: Cardiovascular;  Laterality: N/A;  . THYROIDECTOMY    . TOTAL HIP REVISION Right 08/11/2014   Procedure: RIGHT ACETABULAR REVISION;  Surgeon: Gearlean Alf, MD;  Location: [facility];  Service: Orthopedics;  Laterality: Right;  [facility] VASCULAR SURGERY      Prior to Admission medications   Medication Sig Start Date End Date Taking? Authorizing Provider  albuterol (PROAIR HFA) 108 (90 Base) MCG/ACT inhaler Inhale 2 puffs into the lungs 4 (four) times daily as needed for wheezing or shortness of breath.   Yes [provider]  allopurinol (ZYLOPRIM) 100 MG tablet Take 100 mg by mouth every morning.    Yes [provider]  B Complex Vitamins (VITAMIN B COMPLEX PO) Take 1 tablet by mouth 2 (two) times daily.    Yes [provider]  budesonide-formoterol (SYMBICORT) 160-4.5 MCG/ACT inhaler Inhale 2 puffs into the lungs 2 (two) times daily as needed (for SOB).    Yes [provider]  carvedilol (COREG) 6.25 MG tablet Take 9.25 mg by mouth 2 (two) times daily.    Yes [provider]  cetirizine (ZYRTEC) 10 MG tablet Take 10 mg by mouth daily.  03/14/15  Yes [provider]  Cholecalciferol (VITAMIN D) 50 MCG (2000 UT) CAPS Take 2,000 Units by mouth daily.   Yes [provider]  cinacalcet (SENSIPAR) 30 MG  tablet Take 30 mg by mouth daily.   Yes [provider]  clopidogrel (PLAVIX) 75 MG tablet TAKE 1 TABLET BY MOUTH EVERY DAY Patient taking differently: Take 75 mg by mouth daily.  06/18/17  Yes Waynetta Sandy, MD  Dulaglutide 1.5 MG/0.5ML SOPN Inject 1.5 mg into the skin every Monday.    Yes [provider]  fenofibrate (TRICOR) 145 MG tablet TAKE 1 TABLET (145 MG TOTAL) BY MOUTH DAILY. Patient taking differently: Take 145 mg by mouth daily.  05/11/15  Yes Lorretta Harp, MD  fluticasone (FLONASE) 50 MCG/ACT nasal spray Place 2 sprays into the nose as needed for allergies.    Yes [provider]  hydrOXYzine (ATARAX/VISTARIL) 25 MG tablet Take 25 mg by mouth 2 (two) times daily.    Yes [provider]  insulin lispro protamine-lispro (HUMALOG 75/25 MIX) (75-25) 100  UNIT/ML SUSP injection Inject 35-40 Units into the skin See admin instructions. Inject 40 units into the skin morning and 35 units in the evening   Yes [provider]  levothyroxine (SYNTHROID, LEVOTHROID) 150 MCG tablet Take 150 mcg by mouth daily before breakfast.   Yes [provider]  metolazone (ZAROXOLYN) 5 MG tablet Take 5 mg by mouth every Monday, Wednesday, and Friday.  05/22/19  Yes [provider]  mirabegron ER (MYRBETRIQ) 50 MG TB24 tablet Take 50 mg by mouth daily.   Yes [provider]  Multiple Vitamin (MULTIVITAMIN WITH MINERALS) TABS tablet Take 1 tablet by mouth daily.   Yes [provider]  Multiple Vitamin (MULTIVITAMIN) capsule Take 1 capsule by mouth daily. Use as directed   Yes [provider]  oxyCODONE-acetaminophen (PERCOCET) 10-325 MG tablet Take 1 tablet by mouth every 8 (eight) hours as needed for pain.    Yes [provider]  pantoprazole (PROTONIX) 40 MG tablet Take 1 tablet (40 mg total) by mouth daily. 03/22/19  Yes Guilford Shi, MD  polyvinyl alcohol (LIQUIFILM TEARS) 1.4 % ophthalmic solution  Place 2 drops into both eyes daily as needed for dry eyes.   Yes [provider]  Potassium Chloride ER 20 MEQ TBCR Take 20 mEq by mouth every Monday, Wednesday, and Friday.  07/15/19  Yes [provider]  simvastatin (ZOCOR) 20 MG tablet Take 20 mg by mouth at bedtime. 04/08/17  Yes [provider]  tiZANidine (ZANAFLEX) 4 MG tablet Take 4 mg by mouth 2 (two) times daily.    Yes [provider]  torsemide (DEMADEX) 20 MG tablet Take 4 tablets (80 mg total) by mouth every morning AND 2 tablets (40 mg total) every evening. Patient taking differently: Take 80 mg by mouth every morning and 40 every evening 10/31/18  Yes Mariel Aloe, MD  albuterol (PROVENTIL) (2.5 MG/3ML) 0.083% nebulizer solution Take 2.5 mg by nebulization every 6 (six) hours as needed for wheezing or shortness of breath.    [provider]   Allergies  Allergen Reactions  . Nebivolol Swelling    Chest pain  . Ace Inhibitors Swelling and Other (See Comments)    Tongue swell  . Morphine And Related Itching    Social History   Tobacco Use  . Smoking status: Current Some Day Smoker    Packs/day: 0.25    Years: 40.00    Pack years: 10.00    Types: Cigarettes  . Smokeless tobacco: Never Used  Substance Use Topics  . Alcohol use: Yes    Alcohol/week: 1.0 standard drinks    Types: 1 Standard drinks or equivalent per week    Comment: OCC    Family History  Problem Relation Age of Onset  . Diabetes Brother   . Cardiomyopathy Mother   . Heart disease Mother   . Cancer Father      Review of Systems  Positive ROS: Negative  All other systems have been reviewed and were otherwise negative with the exception of those mentioned in the HPI and as above.  Objective: Vital signs in last 24 hours: Temp:  [98.4 F (36.9 C)] 98.4 F (36.9 C) (12/11 0807) Pulse Rate:  [79] 79 (12/11 0807) Resp:  [18] 18 (12/11 0807) BP: (145)/(55) 145/55 (12/11 0807) SpO2:  [98 %] 98 % (12/11  0807)  General Appearance: Alert, cooperative, no distress, appears stated age Head: Normocephalic, without obvious abnormality, atraumatic Eyes: PERRL, conjunctiva/corneas clear, EOM's intact  Neck: Supple, symmetrical, trachea midline Back: Symmetric, no curvature, ROM normal, no CVA tenderness Lungs:  respirations unlabored Heart: Regular rate and rhythm Abdomen: Soft, non-tender Extremities: Extremities normal, atraumatic, no cyanosis or edema Pulses: 2+ and symmetric all extremities Skin: Skin color, texture, turgor normal, no rashes or lesions  NEUROLOGIC:   Mental status: Alert and oriented x4,  no aphasia, good attention span, fund of knowledge, and memory Motor Exam - grossly normal Sensory Exam - grossly normal Reflexes: Trace Coordination - grossly normal Gait - grossly normal Balance - grossly normal Cranial Nerves: I: smell Not tested  II: visual acuity  OS: nl    OD: nl  II: visual fields Full to confrontation  II: pupils Equal, round, reactive to light  III,VII: ptosis None  III,IV,VI: extraocular muscles  Full ROM  V: mastication Normal  V: facial light touch sensation  Normal  V,VII: corneal reflex  Present  VII: facial muscle function - upper  Normal  VII: facial muscle function - lower Normal  VIII: hearing Not tested  IX: soft palate elevation  Normal  IX,X: gag reflex Present  XI: trapezius strength  5/5  XI: sternocleidomastoid strength 5/5  XI: neck flexion strength  5/5  XII: tongue strength  Normal    Data Review Lab Results  Component Value Date   WBC 10.0 09/15/2019   HGB 10.6 (L) 09/15/2019   HCT 34.9 (L) 09/15/2019   MCV 81.4 09/15/2019   PLT 384 09/15/2019   Lab Results  Component Value Date   NA 138 09/15/2019   K 3.3 (L) 09/15/2019   CL 92 (L) 09/15/2019   CO2 29 09/15/2019   BUN 127 (H) 09/15/2019   CREATININE 3.08 (H) 09/15/2019   GLUCOSE 167 (H) 09/15/2019   Lab Results  Component Value Date   INR 1.1 09/15/2019     Assessment/Plan:  Estimated body mass index is 43.22 kg/m as calculated from the following:   Height as of 09/15/19: 5\' 3"  (1.6 m).   Weight as of 09/15/19: 110.7 kg. Patient admitted for lumbar decompression and fusion L4-5. Patient has failed a reasonable attempt at conservative therapy.  I explained the condition and procedure to the patient and answered any questions.  Patient wishes to proceed with procedure as planned. Understands risks/ benefits and typical outcomes of procedure.   [name] [name] 09/18/2019 1:06 PM

## 2019-09-18 NOTE — Evaluation (Signed)
Physical Therapy Evaluation Patient Details Name: [name] [name] MRN: [id number] DOB: Jul 21, 1951 Today's Date: 09/18/2019   History of Present Illness  68 y.o. 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 Pt now s/p L4-5 lumbar decompression and fusion. PMH significant for CHF, CKD, COPD, DM, gout, MRSA, PVD, TIA.  Clinical Impression  Pt presents to PT with deficits in functional mobility, gait, balance, endurance, strength, power, and with significant back pain. Pt requires minG-minA for bed mobility, transfers, and short household ambulation at this time, limited some by back pain. Pt will benefit from continued acute PT services to reduce falls risk, and improve LE strength and activity tolerance.    Follow Up Recommendations Home health PT;Supervision/Assistance - 24 hour    Equipment Recommendations  Rolling walker with 5" wheels    Recommendations for Other Services       Precautions / Restrictions Precautions Precautions: Fall;Back Precaution Comments: Pt educated on spinal precautions and brace Required Braces or Orthoses: Spinal Brace Spinal Brace: Lumbar corset;Applied in sitting position Restrictions Weight Bearing Restrictions: No      Mobility  Bed Mobility Overal bed mobility: Needs Assistance Bed Mobility: Sidelying to Sit   Sidelying to sit: Min guard          Transfers Overall transfer level: Needs assistance Equipment used: Rolling walker (2 wheeled) Transfers: Sit to/from Stand Sit to Stand: Min assist            Ambulation/Gait Ambulation/Gait assistance: Min guard Gait Distance (Feet): 50 Feet Assistive device: Rolling walker (2 wheeled) Gait Pattern/deviations: Step-to pattern Gait velocity: reduced Gait velocity interpretation: <1.8 ft/sec, indicate of risk for recurrent falls General Gait Details: pt with shortened step to gait, reduced stride length and gait speed. Slow and methodical turns  Marine scientist Rankin (Stroke Patients Only)       Balance Overall balance assessment: Needs assistance Sitting-balance support: No upper extremity supported;Feet supported Sitting balance-Leahy Scale: Good Sitting balance - Comments: supervision   Standing balance support: Bilateral upper extremity supported Standing balance-Leahy Scale: Fair Standing balance comment: minG                             Pertinent Vitals/Pain Pain Assessment: Faces Faces Pain Scale: Hurts whole lot Pain Location: back Pain Descriptors / Indicators: Aching Pain Intervention(s): Limited activity within patient's tolerance;Patient requesting pain meds-RN notified    Home Living Family/patient expects to be discharged to:: Private residence Living Arrangements: Alone Available Help at Discharge: Family;Personal care attendant;Available PRN/intermittently Type of Home: Apartment Home Access: Stairs to enter Entrance Stairs-Rails: Right Entrance Stairs-Number of Steps: 3 Home Layout: One level Home Equipment: Hand held shower head;Shower seat;Cane - single point;Walker - 4 wheels Additional Comments: son lives with patient, personal care attendant 3hrs/day 6 days/wk    Prior Function Level of Independence: Needs assistance   Gait / Transfers Assistance Needed: ambulates short household distances with use of cane or rollator, pt indicating flexed posture 2/2 back pain  ADL's / Homemaking Assistance Needed: Aide comes 3 hrs/day for 6 days/wk to assist with household tasks (food prep, cleaning) and ADLs (bathing, setting out clothes, dressing)        Hand Dominance   Dominant Hand: Right    Extremity/Trunk Assessment   Upper Extremity Assessment Upper Extremity Assessment: Overall WFL for tasks assessed    Lower Extremity Assessment Lower  Extremity Assessment: Generalized weakness    Cervical / Trunk Assessment Cervical / Trunk Assessment: Other  exceptions(body habitus)  Communication   Communication: No difficulties  Cognition Arousal/Alertness: Awake/alert Behavior During Therapy: WFL for tasks assessed/performed Overall Cognitive Status: Within Functional Limits for tasks assessed Area of Impairment: Orientation                                      General Comments General comments (skin integrity, edema, etc.): VSS, pt excited about how well she is able to walk currently compared to pre-surgery    Exercises     Assessment/Plan    PT Assessment Patient needs continued PT services  PT Problem List Decreased strength;Decreased activity tolerance;Decreased balance;Decreased mobility;Decreased knowledge of use of DME;Pain       PT Treatment Interventions DME instruction;Gait training;Stair training;Functional mobility training;Therapeutic activities;Therapeutic exercise;Balance training;Neuromuscular re-education;Patient/family education    PT Goals (Current goals can be found in the Care Plan section)  Acute Rehab PT Goals Patient Stated Goal: To walk better PT Goal Formulation: With patient Time For Goal Achievement: 10/02/19 Potential to Achieve Goals: Good    Frequency Min 5X/week   Barriers to discharge        Co-evaluation               AM-PAC PT "6 Clicks" Mobility  Outcome Measure Help needed turning from your back to your side while in a flat bed without using bedrails?: A Little Help needed moving from lying on your back to sitting on the side of a flat bed without using bedrails?: A Little Help needed moving to and from a bed to a chair (including a wheelchair)?: A Little Help needed standing up from a chair using your arms (e.g., wheelchair or bedside chair)?: A Little Help needed to walk in hospital room?: A Little Help needed climbing 3-5 steps with a railing? : A Lot 6 Click Score: 17    End of Session Equipment Utilized During Treatment: (none) Activity Tolerance:  Patient tolerated treatment well Patient left: in chair;with call bell/phone within reach Nurse Communication: Mobility status PT Visit Diagnosis: Other abnormalities of gait and mobility (R26.89)    Time: 1625-1700 PT Time Calculation (min) (ACUTE ONLY): 35 min   Charges:   PT Evaluation $PT Eval Moderate Complexity: 1 Mod PT Treatments $Gait Training: 8-22 mins        Zenaida Niece, PT, DPT Acute Rehabilitation Pager: [phone]   Zenaida Niece 09/18/2019, 5:11 PM

## 2019-09-19 LAB — GLUCOSE, CAPILLARY
Glucose-Capillary: 118 mg/dL — ABNORMAL HIGH (ref 70–99)
Glucose-Capillary: 129 mg/dL — ABNORMAL HIGH (ref 70–99)
Glucose-Capillary: 134 mg/dL — ABNORMAL HIGH (ref 70–99)
Glucose-Capillary: 251 mg/dL — ABNORMAL HIGH (ref 70–99)
Glucose-Capillary: 296 mg/dL — ABNORMAL HIGH (ref 70–99)
Glucose-Capillary: 297 mg/dL — ABNORMAL HIGH (ref 70–99)
Glucose-Capillary: 359 mg/dL — ABNORMAL HIGH (ref 70–99)
Glucose-Capillary: 64 mg/dL — ABNORMAL LOW (ref 70–99)

## 2019-09-19 MED ORDER — DIPHENHYDRAMINE HCL 25 MG PO CAPS
25.0000 mg | ORAL_CAPSULE | Freq: Four times a day (QID) | ORAL | Status: DC | PRN
  Administered 2019-09-19 – 2019-09-20 (×4): 25 mg via ORAL
  Filled 2019-09-19 (×4): qty 1

## 2019-09-19 NOTE — Evaluation (Signed)
Occupational Therapy Evaluation Patient Details Name: [name] [name] MRN: [id number] DOB: July 22, 1951 Today's Date: 09/19/2019    History of Present Illness 68 y.o. 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 Pt now s/p L4-5 lumbar decompression and fusion. PMH significant for CHF, CKD, COPD, DM, gout, MRSA, PVD, TIA.   Clinical Impression   PTA patient independent with mobility using RW, some assist with LB ADLs and aide assist with IADLs; has 24/7 support from son.  Admitted for above and limited by problem list below, including impaired balance, back pain, back precautions, decreased activity tolerance.  Patient educated on back precautions, ADL compensatory techniques, recommendations, safety and DME.  She currently requires min assist for UB ADLs, mod-max assist for LB ADLs, min guard for transfers and mod assist for toileting.  She will benefit from continued OT services while admitted and after dc at [facility] level in order to optimize independence and safety with ADLs, mobility for return to PLOF.     Follow Up Recommendations  Home health OT;Supervision/Assistance - 24 hour    Equipment Recommendations  None recommended by OT    Recommendations for Other Services       Precautions / Restrictions Precautions Precautions: Fall;Back Precaution Booklet Issued: Yes (comment) Precaution Comments: reviewed with patient, able to recall 3/3 without cueing; min cueing functionally Required Braces or Orthoses: Spinal Brace Spinal Brace: Lumbar corset;Applied in sitting position Restrictions Weight Bearing Restrictions: No      Mobility Bed Mobility Overal bed mobility: Needs Assistance Bed Mobility: Sidelying to Sit;Rolling Rolling: Min guard Sidelying to sit: Min guard       General bed mobility comments: min guard for safety , cueing for techniques  Transfers Overall transfer level: Needs assistance Equipment used: Rolling walker (2 wheeled) Transfers: Sit  to/from [facility] Sit to Stand: Min guard Stand pivot transfers: Min guard       General transfer comment: min guard for safety and balance, good hand placement and technique    Balance Overall balance assessment: Needs assistance Sitting-balance support: No upper extremity supported;Feet supported Sitting balance-Leahy Scale: Good Sitting balance - Comments: supervision   Standing balance support: Bilateral upper extremity supported;Single extremity supported;During functional activity Standing balance-Leahy Scale: Fair Standing balance comment: min guard for safety, able to complete toileting with 1 UE support but relaint on BUE support dynamically                            ADL either performed or assessed with clinical judgement   ADL Overall ADL's : Needs assistance/impaired     Grooming: Set up;Sitting   Upper Body Bathing: Set up;Sitting   Lower Body Bathing: Moderate assistance;Sit to/from stand   Upper Body Dressing : Minimal assistance;Sitting Upper Body Dressing Details (indicate cue type and reason): for brace mgmt  Lower Body Dressing: Maximal assistance;Sit to/from stand Lower Body Dressing Details (indicate cue type and reason): to don socks and manage clothing; min guard sit to stand  Toilet Transfer: Min Dentist Details (indicate cue type and reason): to [facility], cueing for hand placement and safety Toileting- Clothing Manipulation and Hygiene: Moderate assistance;Sit to/from stand Toileting - Clothing Manipulation Details (indicate cue type and reason): will need assist for posterior hygiene, clothing mgmt; min guard sit to stand      Functional mobility during ADLs: Min guard;Rolling walker;Cueing for safety General ADL Comments: reviewed back precautions, safety, ADL compensatory techniques, brace mgmt and safety  Vision   Vision Assessment?: No apparent visual deficits     Perception      Praxis      Pertinent Vitals/Pain Pain Assessment: Faces Faces Pain Scale: Hurts whole lot Pain Location: back Pain Descriptors / Indicators: Aching Pain Intervention(s): Limited activity within patient's tolerance;Monitored during session;Repositioned     Hand Dominance Right   Extremity/Trunk Assessment Upper Extremity Assessment Upper Extremity Assessment: Overall WFL for tasks assessed   Lower Extremity Assessment Lower Extremity Assessment: Defer to PT evaluation   Cervical / Trunk Assessment Cervical / Trunk Assessment: Other exceptions Cervical / Trunk Exceptions: s/p lumbar surgery   Communication Communication Communication: No difficulties   Cognition Arousal/Alertness: Awake/alert Behavior During Therapy: WFL for tasks assessed/performed Overall Cognitive Status: Within Functional Limits for tasks assessed                                 General Comments: appears [facility]    General Comments       Exercises     Shoulder Instructions      Home Living Family/patient expects to be discharged to:: Private residence Living Arrangements: Alone Available Help at Discharge: Family;Personal care attendant;Available PRN/intermittently Type of Home: Apartment Home Access: Stairs to enter Entrance Stairs-Number of Steps: 3 Entrance Stairs-Rails: Right Home Layout: One level     Bathroom Shower/Tub: Teacher, early years/pre: Standard     Home Equipment: Hand held shower head;Shower seat;Cane - single point;Walker - 4 wheels;Bedside commode   Additional Comments: son lives with patient, personal care attendant 3hrs/day 6 days/wk      Prior Functioning/Environment Level of Independence: Needs assistance  Gait / Transfers Assistance Needed: ambulates short household distances with use of cane or rollator, pt indicating flexed posture 2/2 back pain ADL's / Homemaking Assistance Needed: Aide comes 3 hrs/day for 6 days/wk to assist with  household tasks (food prep, cleaning) and ADLs (bathing, setting out clothes, dressing--LB and bra)            OT Problem List: Decreased strength;Decreased activity tolerance;Impaired balance (sitting and/or standing);Decreased safety awareness;Decreased knowledge of use of DME or AE;Decreased knowledge of precautions;Pain      OT Treatment/Interventions: Self-care/ADL training;DME and/or AE instruction;Energy conservation;Therapeutic activities;Patient/family education;Balance training    OT Goals(Current goals can be found in the care plan section) Acute Rehab OT Goals Patient Stated Goal: less pain OT Goal Formulation: With patient Time For Goal Achievement: 10/03/19 Potential to Achieve Goals: Good  OT Frequency: Min 2X/week   Barriers to D/C:            Co-evaluation              AM-PAC OT "6 Clicks" Daily Activity     Outcome Measure Help from another person eating meals?: None Help from another person taking care of personal grooming?: A Little Help from another person toileting, which includes using toliet, bedpan, or urinal?: A Lot Help from another person bathing (including washing, rinsing, drying)?: A Lot Help from another person to put on and taking off regular upper body clothing?: A Little Help from another person to put on and taking off regular lower body clothing?: A Lot 6 Click Score: 16   End of Session Equipment Utilized During Treatment: Rolling walker;Back brace Nurse Communication: Mobility status;Precautions  Activity Tolerance: Patient tolerated treatment well Patient left: in chair;with call bell/phone within reach  OT Visit Diagnosis: Other abnormalities of gait and mobility (  R26.89);Pain Pain - part of body: (back)                Time: 7955-8316 OT Time Calculation (min): 32 min Charges:  OT General Charges $OT Visit: 1 Visit OT Evaluation $OT Eval Moderate Complexity: 1 Mod OT Treatments $Self Care/Home Management : 8-22  mins  Jolaine Artist, OT Acute Rehabilitation Services Pager [phone] Office [phone]   Delight Stare 09/19/2019, 10:08 AM

## 2019-09-19 NOTE — Progress Notes (Signed)
Physical Therapy Treatment Patient Details Name: [name] [name] MRN: [id number] DOB: 1951/03/11 Today's Date: 09/19/2019    History of Present Illness 68 y.o. 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 Pt now s/p L4-5 lumbar decompression and fusion. PMH significant for CHF, CKD, COPD, DM, gout, MRSA, PVD, TIA.    PT Comments    Making slow but steady progress towards functional goals. Reviewed safety with mobility, DME use, and back precautions. Family present and supportive. Too fatigued to navigate steps after ambulating up to 50 ft this visit. Significant DOE however with seated rest SpO2 100% on room air. RN provided inhaler. Continue to recommend HHPT and she will benefit from acute physical therapy prior to d/c. Plan for stair training as tolerated at next visit.   Follow Up Recommendations  Home health PT;Supervision/Assistance - 24 hour     Equipment Recommendations  Rolling walker with 5" wheels    Recommendations for Other Services       Precautions / Restrictions Precautions Precautions: Fall;Back Precaution Booklet Issued: Yes (comment) Precaution Comments: reviewed with patient, able to recall 3/3 without cueing; min cueing functionally Required Braces or Orthoses: Spinal Brace Spinal Brace: Lumbar corset;Applied in sitting position Restrictions Weight Bearing Restrictions: No    Mobility  Bed Mobility               General bed mobility comments: In recliner whe PT entered room.  Transfers Overall transfer level: Needs assistance Equipment used: Rolling walker (2 wheeled) Transfers: Sit to/from [facility] Sit to Stand: Min guard Stand pivot transfers: Min guard       General transfer comment: Min gaurd for safety, VC for hand placement and how to safely rise within precautionary limits. Practiced from chair and BSC.  Ambulation/Gait Ambulation/Gait assistance: Min guard Gait Distance (Feet): 50 Feet(+ 2 small 10  foot bouts in room) Assistive device: Rolling walker (2 wheeled) Gait Pattern/deviations: Step-to pattern Gait velocity: reduced   General Gait Details: Decresed stride, cues for upright posture, awareness, and proximity of walker for support as needed.    Stairs Stairs: (Declines due to fatigue)           Wheelchair Mobility    Modified Rankin (Stroke Patients Only)       Balance Overall balance assessment: Needs assistance Sitting-balance support: No upper extremity supported;Feet supported Sitting balance-Leahy Scale: Good Sitting balance - Comments: supervision   Standing balance support: During functional activity;No upper extremity supported Standing balance-Leahy Scale: Fair                              Cognition Arousal/Alertness: Awake/alert Behavior During Therapy: WFL for tasks assessed/performed Overall Cognitive Status: Within Functional Limits for tasks assessed                                        Exercises      General Comments General comments (skin integrity, edema, etc.): Urinary incontinene, pericare practiced, cues for knee bends vs trunk flexion, son present and observed as he will be caregiver at home for a while. DOE significant however upon sitting and using pursed lip breathing technique SpO2 100% on room air. We also discussed safe method for getting into high bed; she has a step stool and will utilize similarly to how she already uses however with support from RW.  Pertinent Vitals/Pain Pain Assessment: Faces Pain Score: 6  Pain Location: back Pain Descriptors / Indicators: Aching Pain Intervention(s): Monitored during session;Repositioned;Limited activity within patient's tolerance    Home Living                      Prior Function            PT Goals (current goals can now be found in the care plan section) Acute Rehab PT Goals Patient Stated Goal: less pain PT Goal Formulation:  With patient Time For Goal Achievement: 10/02/19 Potential to Achieve Goals: Good Progress towards PT goals: Progressing toward goals    Frequency    Min 5X/week      PT Plan Current plan remains appropriate    Co-evaluation              AM-PAC PT "6 Clicks" Mobility   Outcome Measure  Help needed turning from your back to your side while in a flat bed without using bedrails?: A Little Help needed moving from lying on your back to sitting on the side of a flat bed without using bedrails?: A Little Help needed moving to and from a bed to a chair (including a wheelchair)?: A Little Help needed standing up from a chair using your arms (e.g., wheelchair or bedside chair)?: A Little Help needed to walk in hospital room?: A Little Help needed climbing 3-5 steps with a railing? : A Lot 6 Click Score: 17    End of Session Equipment Utilized During Treatment: Gait belt;Back brace Activity Tolerance: Patient limited by fatigue Patient left: in chair;with call bell/phone within reach;with family/visitor present;with nursing/sitter in room Nurse Communication: Mobility status PT Visit Diagnosis: Other abnormalities of gait and mobility (R26.89);Pain;Difficulty in walking, not elsewhere classified (R26.2) Pain - part of body: (back)     Time: 1125-1150 PT Time Calculation (min) (ACUTE ONLY): 25 min  Charges:  $Gait Training: 8-22 mins $Therapeutic Activity: 8-22 mins                     [employer], PT    Ellouise Newer 09/19/2019, 12:11 PM

## 2019-09-19 NOTE — Progress Notes (Addendum)
Neurosurgery Service Progress Note  Subjective: No acute events overnight, having some diffuse pruritus    Objective: Vitals:   09/19/19 0340 09/19/19 0753 09/19/19 0952 09/19/19 1111  BP: 121/61  (!) 151/77 (!) 101/53  Pulse: 86 90 85 77  Resp: 19 19 (!) 9 15  Temp: 97.7 F (36.5 C)  98 F (36.7 C) 98.2 F (36.8 C)  TempSrc: Oral  Oral Oral  SpO2: 100% 100% 97% 97%   Temp (24hrs), Avg:98 F (36.7 C), Min:97.2 F (36.2 C), Max:98.9 F (37.2 C)  CBC Latest Ref Rng & Units 09/15/2019 04/08/2019 03/21/2019  WBC 4.0 - 10.5 K/uL 10.0 12.1(H) 10.4  Hemoglobin 12.0 - 15.0 g/dL 10.6(L) 12.3 10.5(L)  Hematocrit 36.0 - 46.0 % 34.9(L) 39.8 33.6(L)  Platelets 150 - 400 K/uL 384 356 302   BMP Latest Ref Rng & Units 09/15/2019 04/08/2019 03/21/2019  Glucose 70 - 99 mg/dL 167(H) 76 105(H)  BUN 8 - 23 mg/dL 127(H) 64(H) 90(H)  Creatinine 0.44 - 1.00 mg/dL 3.08(H) 2.20(H) 2.34(H)  BUN/Creat Ratio 11 - 26 - - -  Sodium 135 - 145 mmol/L 138 143 139  Potassium 3.5 - 5.1 mmol/L 3.3(L) 3.4(L) 4.4  Chloride 98 - 111 mmol/L 92(L) 99 99  CO2 22 - 32 mmol/L 29 33(H) 29  Calcium 8.9 - 10.3 mg/dL 9.6 10.6(H) 10.3    Intake/Output Summary (Last 24 hours) at 09/19/2019 1213 Last data filed at 09/19/2019 0650 Gross per 24 hour  Intake 1171.85 ml  Output 1000 ml  Net 171.85 ml    Current Facility-Administered Medications:  .  0.9 %  sodium chloride infusion, 250 mL, Intravenous, Continuous, Eustace Moore, MD .  0.9 % NaCl with KCl 20 mEq/ L  infusion, , Intravenous, Continuous, Eustace Moore, MD .  acetaminophen (TYLENOL) tablet 650 mg, 650 mg, Oral, Q4H PRN **OR** acetaminophen (TYLENOL) suppository 650 mg, 650 mg, Rectal, Q4H PRN, Eustace Moore, MD .  albuterol (PROVENTIL) (2.5 MG/3ML) 0.083% nebulizer solution 2.5 mg, 2.5 mg, Nebulization, Q6H PRN, Eustace Moore, MD, 2.5 mg at 09/19/19 1154 .  allopurinol (ZYLOPRIM) tablet 100 mg, 100 mg, Oral, Daily, Eustace Moore, MD, 100 mg at 09/19/19  3474 .  aspirin EC tablet 81 mg, 81 mg, Oral, Daily, Eustace Moore, MD, 81 mg at 09/19/19 2595 .  B-complex with vitamin C tablet 1 tablet, 1 tablet, Oral, BID, Eustace Moore, MD, 1 tablet at 09/19/19 [phone] .  carvedilol (COREG) tablet 9.375 mg, 9.375 mg, Oral, BID, Eustace Moore, MD, 9.375 mg at 09/19/19 0917 .  celecoxib (CELEBREX) capsule 200 mg, 200 mg, Oral, Q24H, Eustace Moore, MD, 200 mg at 09/18/19 2048 .  Chlorhexidine Gluconate Cloth 2 % PADS 6 each, 6 each, Topical, Daily, Eustace Moore, MD .  cholecalciferol (VITAMIN D3) tablet 2,000 Units, 2,000 Units, Oral, Daily, Eustace Moore, MD, 2,000 Units at 09/19/19 0919 .  cinacalcet (SENSIPAR) tablet 30 mg, 30 mg, Oral, Q breakfast, Eustace Moore, MD, 30 mg at 09/19/19 0919 .  fenofibrate tablet 54 mg, 54 mg, Oral, Daily, Eustace Moore, MD, 54 mg at 09/19/19 0919 .  hydrOXYzine (ATARAX/VISTARIL) tablet 25 mg, 25 mg, Oral, BID, Eustace Moore, MD, 25 mg at 09/19/19 0920 .  insulin aspart (novoLOG) injection 0-20 Units, 0-20 Units, Subcutaneous, Q4H, Judith Part, MD, 3 Units at 09/19/19 0353 .  levothyroxine (SYNTHROID) tablet 150 mcg, 150 mcg, Oral, QAC breakfast, Eustace Moore, MD, 150 mcg at  09/19/19 2481 .  menthol-cetylpyridinium (CEPACOL) lozenge 3 mg, 1 lozenge, Oral, PRN **OR** phenol (CHLORASEPTIC) mouth spray 1 spray, 1 spray, Mouth/Throat, PRN, Eustace Moore, MD .  mirabegron ER [facility]) tablet 50 mg, 50 mg, Oral, Daily, Eustace Moore, MD, 50 mg at 09/19/19 0919 .  mometasone-formoterol (DULERA) 200-5 MCG/ACT inhaler 2 puff, 2 puff, Inhalation, BID, Eustace Moore, MD, 2 puff at 09/19/19 [phone] .  morphine 2 MG/ML injection 2 mg, 2 mg, Intravenous, Q2H PRN, Eustace Moore, MD .  ondansetron [facility]) tablet 4 mg, 4 mg, Oral, Q6H PRN **OR** ondansetron (ZOFRAN) injection 4 mg, 4 mg, Intravenous, Q6H PRN, Eustace Moore, MD .  oxyCODONE (Oxy IR/ROXICODONE) immediate release tablet 10 mg, 10 mg, Oral, Q3H PRN, Eustace Moore, MD, 10 mg at 09/19/19 0956 .  pantoprazole (PROTONIX) EC tablet 40 mg, 40 mg, Oral, Daily, Eustace Moore, MD, 40 mg at 09/19/19 9311 .  senna (SENOKOT) tablet 8.6 mg, 1 tablet, Oral, BID, Eustace Moore, MD, 8.6 mg at 09/19/19 2162 .  sodium chloride flush (NS) 0.9 % injection 3 mL, 3 mL, Intravenous, Q12H, Eustace Moore, MD, 3 mL at 09/18/19 2051 .  sodium chloride flush (NS) 0.9 % injection 3 mL, 3 mL, Intravenous, PRN, Eustace Moore, MD .  tiZANidine (ZANAFLEX) tablet 4 mg, 4 mg, Oral, BID, Eustace Moore, MD, 4 mg at 09/19/19 4469 .  torsemide (DEMADEX) tablet 40 mg, 40 mg, Oral, q1800, Eustace Moore, MD, 40 mg at 09/18/19 1645 .  torsemide (DEMADEX) tablet 80 mg, 80 mg, Oral, QAC breakfast, Eustace Moore, MD, 80 mg at 09/19/19 5072   Physical Exam: Strength 5/5x4, SILTx4  Assessment & Plan: 68 y.o. woman s/p L4-5 PLIF, recovering well. Post-op, expected back pain, radicular symptoms completely resolved, pt is very pleased with her result.  -was getting a nebulizer 2/2 SOB while mobilizing when I rounded, likely will need another day before she's ready to go home -ISS -diphenhydramine prn pruritus -SCDs/TEDs  Judith Part  09/19/19 12:13 PM

## 2019-09-20 LAB — GLUCOSE, CAPILLARY
Glucose-Capillary: 139 mg/dL — ABNORMAL HIGH (ref 70–99)
Glucose-Capillary: 151 mg/dL — ABNORMAL HIGH (ref 70–99)
Glucose-Capillary: 203 mg/dL — ABNORMAL HIGH (ref 70–99)
Glucose-Capillary: 254 mg/dL — ABNORMAL HIGH (ref 70–99)
Glucose-Capillary: 319 mg/dL — ABNORMAL HIGH (ref 70–99)
Glucose-Capillary: 338 mg/dL — ABNORMAL HIGH (ref 70–99)

## 2019-09-20 LAB — CREATININE, SERUM
Creatinine, Ser: 3.8 mg/dL — ABNORMAL HIGH (ref 0.44–1.00)
GFR calc Af Amer: 13 mL/min — ABNORMAL LOW (ref >60)
GFR calc non Af Amer: 12 mL/min — ABNORMAL LOW (ref >60)

## 2019-09-20 MED ORDER — DIPHENHYDRAMINE HCL 25 MG PO CAPS
50.0000 mg | ORAL_CAPSULE | Freq: Four times a day (QID) | ORAL | Status: DC | PRN
  Administered 2019-09-20 – 2019-09-22 (×5): 50 mg via ORAL
  Filled 2019-09-20 (×6): qty 2

## 2019-09-20 MED ORDER — HEPARIN SODIUM (PORCINE) 5000 UNIT/ML IJ SOLN
5000.0000 [IU] | Freq: Three times a day (TID) | INTRAMUSCULAR | Status: DC
  Administered 2019-09-20 – 2019-09-21 (×3): 5000 [IU] via SUBCUTANEOUS
  Filled 2019-09-20 (×3): qty 1

## 2019-09-20 MED ORDER — HYDROCODONE-ACETAMINOPHEN 10-325 MG PO TABS
1.0000 | ORAL_TABLET | ORAL | Status: DC | PRN
  Administered 2019-09-20 – 2019-09-21 (×3): 2 via ORAL
  Filled 2019-09-20 (×3): qty 2

## 2019-09-20 NOTE — Progress Notes (Signed)
Physical Therapy Treatment Patient Details Name: [name] [name] MRN: [id number] DOB: December 26, 1950 Today's Date: 09/20/2019    History of Present Illness 68 y.o. 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 Pt now s/p L4-5 lumbar decompression and fusion. PMH significant for CHF, CKD, COPD, DM, gout, MRSA, PVD, TIA.    PT Comments    Pt seated edge of bed on arrival and reports pain in low back.  Pt not wearing brace. Pt continues to benefit from skilled rehab. Today's session focused on stair training.  She relies heavily on B UE support and only has unilateral railing at home.  Plan next session to stair training with cane in L hand.  If she can not negotiate stair without BUE railing use she may require non emergency medical transport home.  Continues to recommend HHPT with 34 hr assistance.     Follow Up Recommendations  Home health PT;Supervision/Assistance - 24 hour     Equipment Recommendations  Rolling walker with 5" wheels(may need non emergency EMS pending ability to negotiate stairs.)    Recommendations for Other Services       Precautions / Restrictions Precautions Precautions: Fall;Back Precaution Booklet Issued: Yes (comment) Precaution Comments: reviewed with patient, able to recall 3/3 without cueing; min cueing functionally Required Braces or Orthoses: Spinal Brace Spinal Brace: Lumbar corset;Applied in sitting position Restrictions Weight Bearing Restrictions: No    Mobility  Bed Mobility               General bed mobility comments: Pt seated on edge of bed without brace donned.  Educated to wear back brace when upright and to remove only when lying flat.  Transfers Overall transfer level: Needs assistance Equipment used: Rolling walker (2 wheeled) Transfers: Sit to/from [facility] Sit to Stand: Min guard;Min assist Stand pivot transfers: Min assist       General transfer comment: Cues for hand placement.  Pt  utilized rocking momemtum to achieve standing.  Pt required increased assistance from recliner.  Slow to ascend and descend.  Ambulation/Gait Ambulation/Gait assistance: Min guard Gait Distance (Feet): 4 Feet(x4) Assistive device: Rolling walker (2 wheeled) Gait Pattern/deviations: Step-to pattern;Ataxic;Trunk flexed Gait velocity: reduced   General Gait Details: Pt performed short boutd of gt from bed to bedside commode and from recliner to stair trainer and back.  Deferred further gt to focus on stair training.   Stairs Stairs: Yes Stairs assistance: Mod assist Stair Management: Two rails Number of Stairs: 2(x6 in height) General stair comments: Pt relied heavily on B UEsupport.  She only has R railing at home so will plan for repeated stair training with cane in L hand.  Pt very anxious and required increased assistance to descend stairs.   Wheelchair Mobility    Modified Rankin (Stroke Patients Only)       Balance Overall balance assessment: Needs assistance   Sitting balance-Leahy Scale: Good Sitting balance - Comments: supervision     Standing balance-Leahy Scale: Fair Standing balance comment: Pt is heavily reliant on BUE in standing.                            Cognition Arousal/Alertness: Awake/alert Behavior During Therapy: WFL for tasks assessed/performed Overall Cognitive Status: Within Functional Limits for tasks assessed  Exercises      General Comments        Pertinent Vitals/Pain Pain Assessment: 0-10 Pain Score: 10-Worst pain ever Faces Pain Scale: Hurts whole lot Pain Location: back Pain Descriptors / Indicators: Aching Pain Intervention(s): Monitored during session    Home Living                      Prior Function            PT Goals (current goals can now be found in the care plan section) Acute Rehab PT Goals Patient Stated Goal: less pain PT Goal  Formulation: With patient Potential to Achieve Goals: Good Progress towards PT goals: Progressing toward goals    Frequency    Min 5X/week      PT Plan Current plan remains appropriate    Co-evaluation              AM-PAC PT "6 Clicks" Mobility   Outcome Measure  Help needed turning from your back to your side while in a flat bed without using bedrails?: A Little Help needed moving from lying on your back to sitting on the side of a flat bed without using bedrails?: A Little Help needed moving to and from a bed to a chair (including a wheelchair)?: A Little Help needed standing up from a chair using your arms (e.g., wheelchair or bedside chair)?: A Little Help needed to walk in hospital room?: A Little Help needed climbing 3-5 steps with a railing? : A Lot 6 Click Score: 17    End of Session Equipment Utilized During Treatment: Gait belt;Back brace Activity Tolerance: Patient limited by fatigue Patient left: in chair;with call bell/phone within reach;with family/visitor present;with nursing/sitter in room Nurse Communication: Mobility status PT Visit Diagnosis: Other abnormalities of gait and mobility (R26.89);Pain;Difficulty in walking, not elsewhere classified (R26.2) Pain - part of body: (back)     Time: 1751-0258 PT Time Calculation (min) (ACUTE ONLY): 25 min  Charges:  $Gait Training: 8-22 mins $Therapeutic Activity: 8-22 mins                     Erasmo Leventhal , PTA Acute Rehabilitation Services Pager [phone] Office [phone]     [place] Brah[place]t Eli Hose 09/20/2019, 3:06 PM

## 2019-09-20 NOTE — Progress Notes (Signed)
Pharmacy notified nurse of patients' elevated creatinine level and advised nurse to page on call MD about administration of scheduled torsemide and Celebrex. Nurse notified on call MD Nudelman. MD advised nurse to administer torsemide at scheduled time and hold Celebrex at 2200. Nurse will pass along information in report for day shift nurse to follow up with Dr. Ronnald Ramp tomorrow regarding medication administration as advised by MD.  Gwendolyn Grant, RN

## 2019-09-20 NOTE — Progress Notes (Addendum)
Neurosurgery Service Progress Note  Subjective: No acute events overnight, no new complaints  Objective: Vitals:   09/20/19 0400 09/20/19 0813 09/20/19 0853 09/20/19 1116  BP: (!) 142/70 (!) 148/68  (!) 132/52  Pulse: 88 95 (!) 101 91  Resp: 20 14 19 18   Temp: 98 F (36.7 C) 98.7 F (37.1 C)  98.3 F (36.8 C)  TempSrc: Oral Oral  Oral  SpO2: 99% 91% 93% 90%   Temp (24hrs), Avg:98.2 F (36.8 C), Min:97.8 F (36.6 C), Max:98.7 F (37.1 C)  CBC Latest Ref Rng & Units 09/15/2019 04/08/2019 03/21/2019  WBC 4.0 - 10.5 K/uL 10.0 12.1(H) 10.4  Hemoglobin 12.0 - 15.0 g/dL 10.6(L) 12.3 10.5(L)  Hematocrit 36.0 - 46.0 % 34.9(L) 39.8 33.6(L)  Platelets 150 - 400 K/uL 384 356 302   BMP Latest Ref Rng & Units 09/20/2019 09/15/2019 04/08/2019  Glucose 70 - 99 mg/dL - 167(H) 76  BUN 8 - 23 mg/dL - 127(H) 64(H)  Creatinine 0.44 - 1.00 mg/dL 3.80(H) 3.08(H) 2.20(H)  BUN/Creat Ratio 11 - 26 - - -  Sodium 135 - 145 mmol/L - 138 143  Potassium 3.5 - 5.1 mmol/L - 3.3(L) 3.4(L)  Chloride 98 - 111 mmol/L - 92(L) 99  CO2 22 - 32 mmol/L - 29 33(H)  Calcium 8.9 - 10.3 mg/dL - 9.6 10.6(H)    Intake/Output Summary (Last 24 hours) at 09/20/2019 1155 Last data filed at 09/19/2019 1731 Gross per 24 hour  Intake 505 ml  Output 400 ml  Net 105 ml    Current Facility-Administered Medications:  .  0.9 %  sodium chloride infusion, 250 mL, Intravenous, Continuous, Eustace Moore, MD .  0.9 % NaCl with KCl 20 mEq/ L  infusion, , Intravenous, Continuous, Eustace Moore, MD .  acetaminophen (TYLENOL) tablet 650 mg, 650 mg, Oral, Q4H PRN **OR** acetaminophen (TYLENOL) suppository 650 mg, 650 mg, Rectal, Q4H PRN, Eustace Moore, MD .  albuterol (PROVENTIL) (2.5 MG/3ML) 0.083% nebulizer solution 2.5 mg, 2.5 mg, Nebulization, Q6H PRN, Eustace Moore, MD, 2.5 mg at 09/19/19 1154 .  allopurinol (ZYLOPRIM) tablet 100 mg, 100 mg, Oral, Daily, Eustace Moore, MD, 100 mg at 09/20/19 0926 .  aspirin EC tablet 81 mg, 81  mg, Oral, Daily, Eustace Moore, MD, 81 mg at 09/20/19 9937 .  B-complex with vitamin C tablet 1 tablet, 1 tablet, Oral, BID, Eustace Moore, MD, 1 tablet at 09/20/19 [phone] .  carvedilol (COREG) tablet 9.375 mg, 9.375 mg, Oral, BID, Eustace Moore, MD, 9.375 mg at 09/20/19 0925 .  celecoxib (CELEBREX) capsule 200 mg, 200 mg, Oral, Q24H, Eustace Moore, MD, 200 mg at 09/19/19 2148 .  Chlorhexidine Gluconate Cloth 2 % PADS 6 each, 6 each, Topical, Daily, Eustace Moore, MD .  cholecalciferol (VITAMIN D3) tablet 2,000 Units, 2,000 Units, Oral, Daily, Eustace Moore, MD, 2,000 Units at 09/20/19 [phone] .  cinacalcet (SENSIPAR) tablet 30 mg, 30 mg, Oral, Q breakfast, Eustace Moore, MD, 30 mg at 09/20/19 0931 .  diphenhydrAMINE (BENADRYL) capsule 25 mg, 25 mg, Oral, Q6H PRN, Judith Part, MD, 25 mg at 09/20/19 0925 .  fenofibrate tablet 54 mg, 54 mg, Oral, Daily, Eustace Moore, MD, 54 mg at 09/20/19 0930 .  hydrOXYzine (ATARAX/VISTARIL) tablet 25 mg, 25 mg, Oral, BID, Eustace Moore, MD, 25 mg at 09/20/19 0926 .  insulin aspart (novoLOG) injection 0-20 Units, 0-20 Units, Subcutaneous, Q4H, Judith Part, MD, 3 Units at 09/20/19 [phone] .  levothyroxine (SYNTHROID) tablet 150 mcg, 150 mcg, Oral, QAC breakfast, Eustace Moore, MD, 150 mcg at 09/20/19 [phone] .  menthol-cetylpyridinium (CEPACOL) lozenge 3 mg, 1 lozenge, Oral, PRN **OR** phenol (CHLORASEPTIC) mouth spray 1 spray, 1 spray, Mouth/Throat, PRN, Eustace Moore, MD .  mirabegron ER [facility]) tablet 50 mg, 50 mg, Oral, Daily, Eustace Moore, MD, 50 mg at 09/20/19 0926 .  mometasone-formoterol (DULERA) 200-5 MCG/ACT inhaler 2 puff, 2 puff, Inhalation, BID, Eustace Moore, MD, 2 puff at 09/20/19 [phone] .  morphine 2 MG/ML injection 2 mg, 2 mg, Intravenous, Q2H PRN, Eustace Moore, MD .  ondansetron [facility]) tablet 4 mg, 4 mg, Oral, Q6H PRN **OR** ondansetron (ZOFRAN) injection 4 mg, 4 mg, Intravenous, Q6H PRN, Eustace Moore, MD .  oxyCODONE (Oxy  IR/ROXICODONE) immediate release tablet 10 mg, 10 mg, Oral, Q3H PRN, Eustace Moore, MD, 10 mg at 09/20/19 2197 .  pantoprazole (PROTONIX) EC tablet 40 mg, 40 mg, Oral, Daily, Eustace Moore, MD, 40 mg at 09/20/19 0926 .  senna (SENOKOT) tablet 8.6 mg, 1 tablet, Oral, BID, Eustace Moore, MD, 8.6 mg at 09/20/19 5883 .  sodium chloride flush (NS) 0.9 % injection 3 mL, 3 mL, Intravenous, Q12H, Eustace Moore, MD, 3 mL at 09/19/19 2146 .  sodium chloride flush (NS) 0.9 % injection 3 mL, 3 mL, Intravenous, PRN, Eustace Moore, MD .  tiZANidine (ZANAFLEX) tablet 4 mg, 4 mg, Oral, BID, Eustace Moore, MD, 4 mg at 09/20/19 2549 .  torsemide (DEMADEX) tablet 40 mg, 40 mg, Oral, q1800, Eustace Moore, MD, 40 mg at 09/19/19 1719 .  torsemide (DEMADEX) tablet 80 mg, 80 mg, Oral, QAC breakfast, Eustace Moore, MD, 80 mg at 09/20/19 8264   Physical Exam: Strength 5/5x4, SILTx4  Assessment & Plan: 68 y.o. woman s/p L4-5 PLIF, recovering well. Post-op, expected back pain, radicular symptoms completely resolved, pt is very pleased with her result.  -ISS -not comfortable going home yet, PT rec'd home PT -diphenhydramine prn pruritus, will increase dose, try switching oxy to hydrocodone -SCDs/TEDs, POD2 will start [place]  09/20/19 11:55 AM

## 2019-09-21 ENCOUNTER — Inpatient Hospital Stay (HOSPITAL_COMMUNITY): Payer: Medicare Other

## 2019-09-21 LAB — BASIC METABOLIC PANEL
Anion gap: 17 — ABNORMAL HIGH (ref 5–15)
BUN: 140 mg/dL — ABNORMAL HIGH (ref 8–23)
CO2: 26 mmol/L (ref 22–32)
Calcium: 8.5 mg/dL — ABNORMAL LOW (ref 8.9–10.3)
Chloride: 96 mmol/L — ABNORMAL LOW (ref 98–111)
Creatinine, Ser: 4.17 mg/dL — ABNORMAL HIGH (ref 0.44–1.00)
GFR calc Af Amer: 12 mL/min — ABNORMAL LOW (ref >60)
GFR calc non Af Amer: 10 mL/min — ABNORMAL LOW (ref >60)
Glucose, Bld: 106 mg/dL — ABNORMAL HIGH (ref 70–99)
Potassium: 3.8 mmol/L (ref 3.5–5.1)
Sodium: 139 mmol/L (ref 135–145)

## 2019-09-21 LAB — GLUCOSE, CAPILLARY
Glucose-Capillary: 136 mg/dL — ABNORMAL HIGH (ref 70–99)
Glucose-Capillary: 106 mg/dL — ABNORMAL HIGH (ref 70–99)
Glucose-Capillary: 308 mg/dL — ABNORMAL HIGH (ref 70–99)
Glucose-Capillary: 323 mg/dL — ABNORMAL HIGH (ref 70–99)
Glucose-Capillary: 239 mg/dL — ABNORMAL HIGH (ref 70–99)

## 2019-09-21 LAB — URINALYSIS, ROUTINE W REFLEX MICROSCOPIC
Bilirubin Urine: NEGATIVE
Glucose, UA: 50 mg/dL — AB
Hgb urine dipstick: NEGATIVE
Ketones, ur: NEGATIVE mg/dL
Leukocytes,Ua: NEGATIVE
Nitrite: NEGATIVE
Protein, ur: NEGATIVE mg/dL
Specific Gravity, Urine: 1.009 (ref 1.005–1.030)
pH: 5 (ref 5.0–8.0)

## 2019-09-21 MED ORDER — OXYCODONE-ACETAMINOPHEN 7.5-325 MG PO TABS
1.0000 | ORAL_TABLET | ORAL | Status: DC | PRN
  Administered 2019-09-21 (×2): 1 via ORAL
  Filled 2019-09-21 (×2): qty 1

## 2019-09-21 MED ORDER — SODIUM CHLORIDE 0.9 % IV SOLN
INTRAVENOUS | Status: DC
  Administered 2019-09-21: 22:00:00 via INTRAVENOUS

## 2019-09-21 MED ORDER — OXYCODONE-ACETAMINOPHEN 5-325 MG PO TABS
1.0000 | ORAL_TABLET | ORAL | Status: DC | PRN
  Administered 2019-09-21 – 2019-09-22 (×3): 2 via ORAL
  Filled 2019-09-21 (×4): qty 2

## 2019-09-21 NOTE — TOC Transition Note (Signed)
Transition of Care [facility]) - CM/SW Discharge Note   Patient Details  Name: [name] [name] MRN: [id number] Date of Birth: 23-Mar-1951  Transition of Care [facility]) CM/SW Contact:  Pollie Friar, RN Phone Number: 09/21/2019, 11:40 AM   Clinical Narrative:    Pt with orders for [facility] services. CM provided choice and [facility] selected. Butch Penny with [facility] accepted the referral.  Pt with orders for walker and 3 in 1. DME to be delivered to the room. Pt to work on stairs prior to d/c home. She may need PTAR when ready depending on progress. CM following.     Barriers to Discharge: Continued Medical Work up   Patient Goals and CMS Choice   CMS Medicare.gov Compare Post Acute Care list provided to:: Patient Choice offered to / list presented to : Patient  Discharge Placement                       Discharge Plan and Services   Discharge Planning Services: CM Consult Post Acute Care Choice: Durable Medical Equipment, Home Health          DME Arranged: 3-N-1, Walker rolling DME Agency: [employer] Date DME Agency Contacted: 09/21/19   Representative spoke with at DME Agency: [place]: PT, OT [place] Agency: [place] ([place] Date [place]: 09/21/19   Representative spoke with at [place]: [place] ([place]) Interventions     Readmission Risk Interventions No flowsheet data found.

## 2019-09-21 NOTE — Progress Notes (Signed)
Physical Therapy Treatment Patient Details Name: ARTHA STAVROS MRN: [id number] DOB: 16-Jan-1951 Today's Date: 09/21/2019    History of Present Illness 68 y.o. 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 Pt now s/p L4-5 lumbar decompression and fusion. PMH significant for CHF, CKD, COPD, DM, gout, MRSA, PVD, TIA.    PT Comments    Pt performed limited gt training and functional mobility.  Focus of session remains on stair training.  Performed this session with use of SPC in L hand.  She continues to require moderate assistance and is very hesitant about entry into her home.  Spoke with patient about use on EMS ( non emergent) for transport home.  Pt reports she wants to wait and see how she does tomorrow.  Continue to recommend HHPT at d/c with 24 hr assistance.     Follow Up Recommendations  Home health PT;Supervision/Assistance - 24 hour     Equipment Recommendations  Rolling walker with 5" wheels(Pt may need non emergent EMS for transpoert into home.)    Recommendations for Other Services       Precautions / Restrictions Precautions Precautions: Fall;Back Precaution Booklet Issued: Yes (comment) Precaution Comments: reviewed with patient, able to recall 3/3 without cueing; min cueing functionally Required Braces or Orthoses: Spinal Brace Spinal Brace: Lumbar corset;Applied in sitting position Restrictions Weight Bearing Restrictions: No    Mobility  Bed Mobility               General bed mobility comments: Pt seated on edge of bed without brace donned.  Educated to wear back brace when upright and to remove only when lying flat.  Transfers Overall transfer level: Needs assistance Equipment used: Rolling walker (2 wheeled) Transfers: Sit to/from [facility] Sit to Stand: Min guard;Supervision Stand pivot transfers: Min guard       General transfer comment: Pt continues to required cues for hand placement. She performed all  transfers from elevated surface heights.  Ambulation/Gait Ambulation/Gait assistance: Min guard Gait Distance (Feet): 10 Feet(x2) Assistive device: Rolling walker (2 wheeled) Gait Pattern/deviations: Step-to pattern;Trunk flexed Gait velocity: reduced   General Gait Details: Pt required cues for upper trunk control, RW safety and sequencing.  Pt limited due to fatigue and pain.   Stairs Stairs: Yes Stairs assistance: Mod assist Stair Management: One rail Left;With cane Number of Stairs: 2 General stair comments: Cues for sequencing and hand placement and use of SPC.  Pt slow and guarded and heavily reliant on UE support.  Pt very anxious on the stairs.   Wheelchair Mobility    Modified Rankin (Stroke Patients Only)       Balance Overall balance assessment: Needs assistance Sitting-balance support: No upper extremity supported;Feet supported Sitting balance-Leahy Scale: Good Sitting balance - Comments: supervision     Standing balance-Leahy Scale: Fair                              Cognition Arousal/Alertness: Awake/alert Behavior During Therapy: WFL for tasks assessed/performed Overall Cognitive Status: Within Functional Limits for tasks assessed Area of Impairment: Orientation                               General Comments: appears [facility]       Exercises      General Comments        Pertinent Vitals/Pain Pain Assessment: 0-10 Pain Score: 6  Pain  Location: back Pain Descriptors / Indicators: Aching Pain Intervention(s): Monitored during session;Repositioned    Home Living                      Prior Function            PT Goals (current goals can now be found in the care plan section) Acute Rehab PT Goals Patient Stated Goal: less pain Potential to Achieve Goals: Good Progress towards PT goals: Progressing toward goals    Frequency    Min 5X/week      PT Plan Current plan remains appropriate     Co-evaluation              AM-PAC PT "6 Clicks" Mobility   Outcome Measure  Help needed turning from your back to your side while in a flat bed without using bedrails?: A Little Help needed moving from lying on your back to sitting on the side of a flat bed without using bedrails?: A Little Help needed moving to and from a bed to a chair (including a wheelchair)?: A Little Help needed standing up from a chair using your arms (e.g., wheelchair or bedside chair)?: A Little Help needed to walk in hospital room?: A Little Help needed climbing 3-5 steps with a railing? : A Little 6 Click Score: 18    End of Session Equipment Utilized During Treatment: Gait belt;Back brace Activity Tolerance: Patient limited by fatigue Patient left: in chair(left in shower with NT and RN.) Nurse Communication: Mobility status PT Visit Diagnosis: Other abnormalities of gait and mobility (R26.89);Pain;Difficulty in walking, not elsewhere classified (R26.2) Pain - part of body: (back)     Time: 1032-1100 PT Time Calculation (min) (ACUTE ONLY): 28 min  Charges:  $Gait Training: 8-22 mins $Therapeutic Activity: 8-22 mins                     Erasmo Leventhal , PTA Acute Rehabilitation Services Pager [phone] Office [phone]     Rashiya Lof[place] Eli Hose 09/21/2019, 1:58 PM

## 2019-09-21 NOTE — Consult Note (Addendum)
[facility] Nephrology Consultation Note  Requesting MD: Dr Sherley Bounds Reason for consult: AKI on CKD  HPI:  [name] [name] is a 68 y.o. 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 Ronnald Ramp for AKI on CKD.  Patient reported that she takes torsemide as and was recently reduced the dose.  She follows with Dr. [name] at [facility] and was seen by PA about 2 to 3 weeks ago.  She does not remember exact creatinine level and lab values.  On admission the creatinine level was 3.08 and progressed to 4.17 today and BUN 140.  She received Celebrex for pain management which was discontinued today.  Also she has been receiving torsemide as home medication. Patient reports good output however no exact ins and outs recorded.  She denied chest pain, shortness of breath, cough, nausea, vomiting, weakness, dysgeusia.  Recovering well from the surgery.  Unknown if she was hypotensive during surgery.  Estimated blood loss was around 200 cc recorded. Denies dysuria, urgency, frequency. Her son at bedside.  PMHx:   Past Medical History:  Diagnosis Date  . Anemia   . Arthritis   . Asthma   . Bronchitis   . CHF (congestive heart failure) ([place])   . Chronic kidney disease   . Chronic kidney disease   . Chronic pain syndrome   . COPD (chronic obstructive pulmonary disease) ([place])   . Cramping of feet   . Diabetes mellitus   . Dyspnea   . GERD (gastroesophageal reflux disease)   . Gout   . Headache   . History of hiatal hernia   . History of hip replacement, total   . History of transfusion   . Hx MRSA infection    abscess left groin  . Hx of cardiac cath 06/2014  . Hyperlipidemia   . Hypertension   . Hypothyroid   . 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[place])   .  Morbidly obese ([place])   . Peripheral vascular disease ([place])   . Positive cardiac stress test 12/2013   anterior and lateral ischemia on Myoview  . Sleep apnea    UNABLE TO TOLERATE C PAP  . Stress incontinence   . Thyroid disease   . TIA (transient ischemic attack)     X2 NO RESIDUAL PROBLEMS    Past Surgical History:  Procedure Laterality Date  . ABDOMINAL HYSTERECTOMY    . COLON SURGERY  1995   DUE TO POLYP  . DILATION AND CURETTAGE OF UTERUS    . FOREARM FRACTURE SURGERY     Left arm  . HERNIA REPAIR     w/ mesh  . INCISION AND DRAINAGE ABSCESS Left 02/04/2013   Procedure: INCISION AND DRAINAGE LEFT BUTTOCK ABSCESS; INCISION AND DRAINAGE LEFT BREAST ABSCESS;  Surgeon: Harl Bowie, MD;  Location: [facility];  Service: General;  Laterality: Left;  . INCISION AND DRAINAGE ABSCESS N/A 02/12/2013   Procedure: INCISION AND DEBRIDEMENT BUTTOCK WOUND ;  Surgeon: [name]. Georgette Dover, MD;  Location: [facility];  Service: General;  Laterality: N/A;  . INCISION AND DRAINAGE ABSCESS N/A 02/14/2013   Procedure: INCISION AND DRAINAGE/DRESSING CHANGE;  Surgeon: Harl Bowie, MD;  Location: [facility];  Service: General;  Laterality: N/A;  . INCISION AND DRAINAGE ABSCESS N/A 03/21/2015   Procedure: INCISION AND DRAINAGE  PUBIC ABSCESS;  Surgeon: Excell Seltzer, MD;  Location: [facility];  Service: General;  Laterality: N/A;  . INCISION AND DRAINAGE PERIRECTAL ABSCESS Left 02/10/2013   Procedure: IRRIGATION AND DEBRIDEMENT OF BUTTOCK/PERINEAL ABSCESS;  Surgeon: [name]. Georgette Dover, MD;  Location: [facility];  Service: General;  Laterality: Left;  . INCISION AND DRAINAGE PERIRECTAL ABSCESS N/A 02/16/2013   Procedure: IRRIGATION AND DEBRIDEMENT PERINEAL ABSCESS;  Surgeon: Zenovia Jarred, MD;  Location: [facility];  Service: General;  Laterality: N/A;  . IRRIGATION AND DEBRIDEMENT ABSCESS N/A 02/06/2013   Procedure: IRRIGATION AND DEBRIDEMENT BUTTOCK ABSCESS AND DRESSING CHANGE;  Surgeon: Harl Bowie, MD;  Location: [facility];  Service: General;  Laterality: N/A;  . IRRIGATION AND DEBRIDEMENT ABSCESS Left 02/08/2013   Procedure: IRRIGATION AND DEBRIDEMENT ABSCESS/DRESSING CHANGE;  Surgeon: Gwenyth Ober, MD;  Location: [place];  Service: General;  Laterality: Left;  . JOINT REPLACEMENT  2010 / 2012   . LAPAROSCOPIC CHOLECYSTECTOMY    . LEFT HEART CATHETERIZATION WITH CORONARY ANGIOGRAM N/A 07/02/2014   Procedure: LEFT HEART CATHETERIZATION WITH CORONARY ANGIOGRAM;  Surgeon: Lorretta Harp, MD;  Location: [facility] CATH LAB;  Service: Cardiovascular;  Laterality: N/A;  . LOWER EXTREMITY ANGIOGRAM Right 04/20/2016   Procedure: Lower Extremity Angiogram;  Surgeon: Elam Dutch, MD;  Location: [place] [facility];  Service: Cardiovascular;  Laterality: Right;  . PERIPHERAL VASCULAR CATHETERIZATION N/A 04/20/2016   Procedure: Abdominal Aortogram;  Surgeon: Elam Dutch, MD;  Location: [place] [facility];  Service: Cardiovascular;  Laterality: N/A;  . PERIPHERAL VASCULAR CATHETERIZATION Right 04/20/2016   Procedure: Peripheral Vascular Intervention;  Surgeon: Elam Dutch, MD;  Location: [place] [facility];  Service: Cardiovascular;  Laterality: Right;  popiteal  . RIGHT HEART CATH N/A 10/27/2018   Procedure: RIGHT HEART CATH;  Surgeon: Larey Dresser, MD;  Location: [place] [facility];  Service: Cardiovascular;  Laterality: N/A;  . RIGHT HEART CATH N/A 03/20/2019   Procedure: RIGHT HEART CATH;  Surgeon: Larey Dresser, MD;  Location: [place] [facility];  Service: Cardiovascular;  Laterality: N/A;  . THYROIDECTOMY    . TOTAL HIP REVISION Right 08/11/2014   Procedure: RIGHT ACETABULAR REVISION;  Surgeon: Gearlean Alf, MD;  Location[facility];  Service: Orthopedics;  Laterality: Right;  [facility] VASCULAR SURGERY      Family Hx:  Family History  Problem Relation Age of Onset  . Diabetes Brother   . Cardiomyopathy Mother   . Heart disease Mother   . Cancer Father     Social History:  reports that she has been smoking  cigarettes. She has a 10.00 pack-year smoking history. She has never used smokeless tobacco. She reports current alcohol use of about 1.0 standard drinks of alcohol per week. She reports that she does not use drugs.  Allergies:  Allergies  Allergen Reactions  . Nebivolol Swelling    Chest pain  . Ace Inhibitors Swelling and Other (See Comments)    Tongue swell  . Morphine And Related Itching    Medications: Prior to Admission medications   Medication Sig Start Date End Date Taking? Authorizing Provider  albuterol (PROAIR HFA) 108 (90 Base) MCG/ACT inhaler Inhale 2 puffs into the lungs 4 (four) times daily as needed for wheezing or shortness of breath.   Yes [provider]  allopurinol (ZYLOPRIM) 100 MG tablet Take 100 mg by mouth every morning.    Yes [provider]  B Complex Vitamins (VITAMIN B COMPLEX PO) Take  1 tablet by mouth 2 (two) times daily.    Yes [provider]  budesonide-formoterol (SYMBICORT) 160-4.5 MCG/ACT inhaler Inhale 2 puffs into the lungs 2 (two) times daily as needed (for SOB).    Yes [provider]  carvedilol (COREG) 6.25 MG tablet Take 9.25 mg by mouth 2 (two) times daily.    Yes [provider]  cetirizine (ZYRTEC) 10 MG tablet Take 10 mg by mouth daily.  03/14/15  Yes [provider]  Cholecalciferol (VITAMIN D) 50 MCG (2000 UT) CAPS Take 2,000 Units by mouth daily.   Yes [provider]  cinacalcet (SENSIPAR) 30 MG tablet Take 30 mg by mouth daily.   Yes [provider]  clopidogrel (PLAVIX) 75 MG tablet TAKE 1 TABLET BY MOUTH EVERY DAY Patient taking differently: Take 75 mg by mouth daily.  06/18/17  Yes Waynetta Sandy, MD  Dulaglutide 1.5 MG/0.5ML SOPN Inject 1.5 mg into the skin every Monday.    Yes [provider]  fenofibrate (TRICOR) 145 MG tablet TAKE 1 TABLET (145 MG TOTAL) BY MOUTH DAILY. Patient taking differently: Take 145 mg by mouth daily.  05/11/15  Yes  Lorretta Harp, MD  fluticasone (FLONASE) 50 MCG/ACT nasal spray Place 2 sprays into the nose as needed for allergies.    Yes [provider]  hydrOXYzine (ATARAX/VISTARIL) 25 MG tablet Take 25 mg by mouth 2 (two) times daily.    Yes [provider]  insulin lispro protamine-lispro (HUMALOG 75/25 MIX) (75-25) 100 UNIT/ML SUSP injection Inject 35-40 Units into the skin See admin instructions. Inject 40 units into the skin morning and 35 units in the evening   Yes [provider]  levothyroxine (SYNTHROID, LEVOTHROID) 150 MCG tablet Take 150 mcg by mouth daily before breakfast.   Yes [provider]  metolazone (ZAROXOLYN) 5 MG tablet Take 5 mg by mouth every Monday, Wednesday, and Friday.  05/22/19  Yes [provider]  mirabegron ER (MYRBETRIQ) 50 MG TB24 tablet Take 50 mg by mouth daily.   Yes [provider]  Multiple Vitamin (MULTIVITAMIN WITH MINERALS) TABS tablet Take 1 tablet by mouth daily.   Yes [provider]  Multiple Vitamin (MULTIVITAMIN) capsule Take 1 capsule by mouth daily. Use as directed   Yes [provider]  oxyCODONE-acetaminophen (PERCOCET) 10-325 MG tablet Take 1 tablet by mouth every 8 (eight) hours as needed for pain.    Yes [provider]  pantoprazole (PROTONIX) 40 MG tablet Take 1 tablet (40 mg total) by mouth daily. 03/22/19  Yes Guilford Shi, MD  polyvinyl alcohol (LIQUIFILM TEARS) 1.4 % ophthalmic solution Place 2 drops into both eyes daily as needed for dry eyes.   Yes [provider]  Potassium Chloride ER 20 MEQ TBCR Take 20 mEq by mouth every Monday, Wednesday, and Friday.  07/15/19  Yes [provider]  simvastatin (ZOCOR) 20 MG tablet Take 20 mg by mouth at bedtime. 04/08/17  Yes [provider]  tiZANidine (ZANAFLEX) 4 MG tablet Take 4 mg by mouth 2 (two) times daily.    Yes [provider]  torsemide (DEMADEX) 20 MG tablet Take 4 tablets (80 mg  total) by mouth every morning AND 2 tablets (40 mg total) every evening. Patient taking differently: Take 80 mg by mouth every morning and 40 every evening 10/31/18  Yes Mariel Aloe, MD  albuterol (PROVENTIL) (2.5 MG/3ML) 0.083% nebulizer solution Take 2.5 mg by nebulization every 6 (six) hours as needed for  wheezing or shortness of breath.    [provider]    I have reviewed the patient's current medications.  Labs:  Results for orders placed or performed during the hospital encounter of 09/18/19 (from the past 48 hour(s))  Glucose, capillary     Status: Abnormal   Collection Time: 09/19/19  5:09 PM  Result Value Ref Range   Glucose-Capillary 359 (H) 70 - 99 mg/dL  Glucose, capillary     Status: Abnormal   Collection Time: 09/19/19  7:39 PM  Result Value Ref Range   Glucose-Capillary 297 (H) 70 - 99 mg/dL  Glucose, capillary     Status: Abnormal   Collection Time: 09/19/19 11:16 PM  Result Value Ref Range   Glucose-Capillary 118 (H) 70 - 99 mg/dL   Comment 1 Notify RN    Comment 2 Document in Chart   Glucose, capillary     Status: Abnormal   Collection Time: 09/20/19  3:45 AM  Result Value Ref Range   Glucose-Capillary 151 (H) 70 - 99 mg/dL   Comment 1 Notify RN    Comment 2 Document in Chart   Glucose, capillary     Status: Abnormal   Collection Time: 09/20/19  8:01 AM  Result Value Ref Range   Glucose-Capillary 139 (H) 70 - 99 mg/dL  Creatinine, serum     Status: Abnormal   Collection Time: 09/20/19 10:21 AM  Result Value Ref Range   Creatinine, Ser 3.80 (H) 0.44 - 1.00 mg/dL   GFR calc non Af Amer 12 (L) >60 mL/min   GFR calc Af Amer 13 (L) >60 mL/min    Comment: Performed at [facility], [address] [place], [place] [address]  Glucose, capillary     Status: Abnormal   Collection Time: 09/20/19 11:13 AM  Result Value Ref Range   Glucose-Capillary 254 (H) 70 - 99 mg/dL  Glucose, capillary     Status: Abnormal   Collection Time: 09/20/19  4:29 PM   Result Value Ref Range   Glucose-Capillary 338 (H) 70 - 99 mg/dL  Glucose, capillary     Status: Abnormal   Collection Time: 09/20/19  7:15 PM  Result Value Ref Range   Glucose-Capillary 319 (H) 70 - 99 mg/dL   Comment 1 Notify RN   Glucose, capillary     Status: Abnormal   Collection Time: 09/20/19 11:36 PM  Result Value Ref Range   Glucose-Capillary 203 (H) 70 - 99 mg/dL  Glucose, capillary     Status: Abnormal   Collection Time: 09/21/19  3:27 AM  Result Value Ref Range   Glucose-Capillary 136 (H) 70 - 99 mg/dL  Glucose, capillary     Status: Abnormal   Collection Time: 09/21/19  7:39 AM  Result Value Ref Range   Glucose-Capillary 106 (H) 70 - 99 mg/dL   Comment 1 Notify RN    Comment 2 Document in Chart   Basic metabolic panel     Status: Abnormal   Collection Time: 09/21/19  7:55 AM  Result Value Ref Range   Sodium 139 135 - 145 mmol/L   Potassium 3.8 3.5 - 5.1 mmol/L   Chloride 96 (L) 98 - 111 mmol/L   CO2 26 22 - 32 mmol/L   Glucose, Bld 106 (H) 70 - 99 mg/dL   BUN 140 (H) 8 - 23 mg/dL   Creatinine, Ser 4.17 (H) 0.44 - 1.00 mg/dL   Calcium 8.5 (L) 8.9 - 10.3 mg/dL   GFR calc non  Af Amer 10 (L) >60 mL/min   GFR calc Af Amer 12 (L) >60 mL/min   Anion gap 17 (H) 5 - 15    Comment: Performed at [facility] [address] [place], [place] [address]  Glucose, capillary     Status: Abnormal   Collection Time: 09/21/19 11:40 AM  Result Value Ref Range   Glucose-Capillary 308 (H) 70 - 99 mg/dL   Comment 1 Notify RN    Comment 2 Document in Chart   Glucose, capillary     Status: Abnormal   Collection Time: 09/21/19  3:31 PM  Result Value Ref Range   Glucose-Capillary 323 (H) 70 - 99 mg/dL   Comment 1 Notify RN    Comment 2 Document in Chart      ROS:  Pertinent items noted in HPI and remainder of comprehensive ROS otherwise negative.  Physical Exam: Vitals:   09/21/19 1138 09/21/19 1528  BP: (!) 92/46 (!) 126/49  Pulse: 79 77  Resp: 16 16  Temp:  (!) 96.3 F (35.7 C) (!) 97.5 F (36.4 C)  SpO2: 98% 97%     General exam: Appears calm and comfortable  Respiratory system: Clear to auscultation. Respiratory effort normal. No wheezing or crackle Cardiovascular system: S1 & S2 heard, RRR.  No pedal edema. Gastrointestinal system: Abdomen is nondistended, soft and nontender. Normal bowel sounds heard. Central nervous system: Alert and oriented. No focal neurological deficits. Extremities: No edema, cyanosis. Skin: No rashes, lesions or ulcers Psychiatry: Judgement and insight appear normal. Mood & affect appropriate.   Assessment/Plan:  #Acute kidney injury on CKD stage IV likely hemodynamically mediated post surgery concomitant with use of NSAIDs and diuretics.  She looks euvolemic on exam.  BUN significantly elevated.  No uremic symptoms at the moment. Check UA, US renal and bladder scan to rule out obstruction Strict ins and out Discontinue any NSAIDs including Celebrex She looks like intravascularly depleted therefore discontinue torsemide and try gentle IV hydration for 12 hours only.   No indication for dialysis.  #Hypertension: Blood pressure mostly acceptable with intermittent low BP.  On Coreg.  #Spinal stenosis status post decompressive lumbar laminectomy: Per neurosurgery.  It seems like she is recovering well.  #Hyperparathyroidism with parathyroid adenoma: Currently on Sensipar.  Monitor calcium level.  #Anemia due to CKD and blood loss: Monitor hemoglobin.  No sign of active bleed.  Thank you for the consult.  We will follow.   Egan Berkheimer Tanna Furry 09/21/2019, 4:12 PM  [facility].

## 2019-09-21 NOTE — Plan of Care (Signed)
  Problem: Nutrition: Goal: Adequate nutrition will be maintained Outcome: Progressing   

## 2019-09-21 NOTE — Progress Notes (Signed)
Inpatient Diabetes Program Recommendations  AACE/ADA: New Consensus Statement on Inpatient Glycemic Control (2015)  Target Ranges:  Prepandial:   less than 140 mg/dL      Peak postprandial:   less than 180 mg/dL (1-2 hours)      Critically ill patients:  140 - 180 mg/dL   Lab Results  Component Value Date   GLUCAP 323 (H) 09/21/2019   HGBA1C 7.6 (H) 09/15/2019    Review of Glycemic Control Results for [name] [name], [name] [name] (MRN [id number]) as of 09/21/2019 17:49  Ref. Range 09/20/2019 23:36 09/21/2019 03:27 09/21/2019 07:39 09/21/2019 11:40 09/21/2019 15:31  Glucose-Capillary Latest Ref Range: 70 - 99 mg/dL 203 (H) 136 (H) 106 (H) 308 (H) 323 (H)   Diabetes history: DM2 Outpatient Diabetes medications: 75/25 40 units am + 35 units pm Current orders for Inpatient glycemic control: Novolog resistant correction q 4 hrs.  Inpatient Diabetes Program Recommendations:   -Start 50% home insulin dose 70/30 20 units bid ac meals  -Decrease Novolog correction to tid + hs 0-5 units  Thank you, Bethena Roys E. Va Broadwell, RN, MSN, CDE  Diabetes Coordinator Inpatient Glycemic Control Team Team Pager [phone] (8am-5pm) 09/21/2019 5:51 PM

## 2019-09-21 NOTE — Progress Notes (Signed)
Occupational Therapy Treatment Patient Details Name: [name] [name] MRN: [id number] DOB: 01-29-1951 Today's Date: 09/21/2019    History of present illness 68 y.o. 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 Pt now s/p L4-5 lumbar decompression and fusion. PMH significant for CHF, CKD, COPD, DM, gout, MRSA, PVD, TIA.   OT comments  Pt received sitting on BSC. Pt required minA to powerup from [facility] and required vc for adherence to back precaution. Pt demonstrated difficulty with pericare and educated pt on adaptive equipment to assist with toileting hygiene. Pt able to state 3/3 back precautions. She required minA for brace management and minA for bed mobility to return to supine. Pt limited by pain this session with reports of 10/10 pain, RN provided pain meds about 1 hour prior to OT arrival, per pt. Pt will continue to benefit from skilled OT services to maximize safety and independence with ADL/IADL and functional mobility. Will continue to follow acutely and progress as tolerated.    Follow Up Recommendations  Home health OT;Supervision/Assistance - 24 hour    Equipment Recommendations  3 in 1 bedside commode    Recommendations for Other Services      Precautions / Restrictions Precautions Precautions: Fall;Back Precaution Booklet Issued: Yes (comment) Precaution Comments: reviewed with patient, able to recall 3/3 without cueing; min cueing functionally Required Braces or Orthoses: Spinal Brace Spinal Brace: Lumbar corset;Applied in sitting position Restrictions Weight Bearing Restrictions: No       Mobility Bed Mobility Overal bed mobility: Needs Assistance Bed Mobility: Sit to Sidelying;Rolling Rolling: Min guard       Sit to sidelying: Min assist General bed mobility comments: minA to return BLE to bed;cues for technique  Transfers Overall transfer level: Needs assistance Equipment used: Rolling walker (2 wheeled) Transfers: Sit to/from [employer] Sit to Stand: Min assist Stand pivot transfers: Min guard;Min assist       General transfer comment: mina for progressing to standing with upright posture    Balance Overall balance assessment: Needs assistance Sitting-balance support: No upper extremity supported;Feet supported Sitting balance-Leahy Scale: Good Sitting balance - Comments: supervision   Standing balance support: During functional activity;Single extremity supported Standing balance-Leahy Scale: Fair Standing balance comment: reliant on UE support in standing                           ADL either performed or assessed with clinical judgement   ADL Overall ADL's : Needs assistance/impaired Eating/Feeding: Set up;Sitting   Grooming: Set up;Sitting           Upper Body Dressing : Minimal assistance;Sitting Upper Body Dressing Details (indicate cue type and reason): for brace mgmt Lower Body Dressing: Maximal assistance;Sit to/from stand   Toilet Transfer: Minimal Scientist, forensic Details (indicate cue type and reason): from [facility] to EOB;cueing for sit<>stand with upright posture;minA for powerup Toileting- Clothing Manipulation and Hygiene: Moderate assistance;Sit to/from stand Toileting - Clothing Manipulation Details (indicate cue type and reason): modA for anterior hygiene due to large body habitus;discussed and education pt on toilet tongs to implement during adl     Functional mobility during ADLs: Min guard;Rolling walker;Minimal assistance General ADL Comments: continue to review back precautions and safety;educated pt on AE to assist with ADL     Vision   Vision Assessment?: No apparent visual deficits   Perception     Praxis      Cognition Arousal/Alertness: Awake/alert Behavior During Therapy: WFL for tasks assessed/performed  Overall Cognitive Status: Within Functional Limits for tasks assessed Area of Impairment: Orientation                                General Comments: able to recall 3/3 precautions, able to reason through hypothetical scenarios to adhere to precautions upon return home        Exercises     Shoulder Instructions       General Comments pt's son present during today's session;noted blood following anterior pericare;RN notified    Pertinent Vitals/ Pain       Pain Assessment: 0-10 Pain Score: 10-Worst pain ever Pain Location: back Pain Descriptors / Indicators: Aching;Grimacing;Guarding Pain Intervention(s): Limited activity within patient's tolerance;Monitored during session;Premedicated before session  Home Living                                          Prior Functioning/Environment              Frequency  Min 2X/week        Progress Toward Goals  OT Goals(current goals can now be found in the care plan section)  Progress towards OT goals: Progressing toward goals  Acute Rehab OT Goals Patient Stated Goal: to not be in pain OT Goal Formulation: With patient Time For Goal Achievement: 10/03/19 Potential to Achieve Goals: Good ADL Goals Pt Will Perform Grooming: with modified independence;standing Pt Will Perform Lower Body Bathing: with supervision;sit to/from stand;with adaptive equipment;sitting/lateral leans Pt Will Perform Lower Body Dressing: with supervision;with adaptive equipment;sit to/from stand Pt Will Transfer to Toilet: with modified independence;ambulating;bedside commode Pt Will Perform Toileting - Clothing Manipulation and hygiene: with modified independence;sit to/from stand;sitting/lateral leans  Plan Discharge plan remains appropriate    Co-evaluation                 AM-PAC OT "6 Clicks" Daily Activity     Outcome Measure   Help from another person eating meals?: None Help from another person taking care of personal grooming?: A Little Help from another person toileting, which includes using toliet, bedpan, or  urinal?: A Lot Help from another person bathing (including washing, rinsing, drying)?: A Lot Help from another person to put on and taking off regular upper body clothing?: A Little Help from another person to put on and taking off regular lower body clothing?: A Lot 6 Click Score: 16    End of Session Equipment Utilized During Treatment: Rolling walker;Back brace  OT Visit Diagnosis: Other abnormalities of gait and mobility (R26.89);Pain Pain - part of body: (back)   Activity Tolerance Patient limited by pain   Patient Left with call bell/phone within reach;in bed;with family/visitor present   Nurse Communication Mobility status;Other (comment)(pt bleeding from perineal area with wiping)        Time: 1430-1446 OT Time Calculation (min): 16 min  Charges: OT General Charges $OT Visit: 1 Visit OT Treatments $Self Care/Home Management : 8-22 mins  Dorinda Hill OTR/L Acute Rehabilitation Services Office: [place] 09/21/2019, 2:55 PM

## 2019-09-21 NOTE — Progress Notes (Signed)
Subjective: Patient reports appropriate back soreness, no l;eg pain or NTW  Objective: Vital signs in last 24 hours: Temp:  [96.3 F (35.7 C)-98.7 F (37.1 C)] 96.3 F (35.7 C) (12/14 1138) Pulse Rate:  [78-89] 79 (12/14 1138) Resp:  [15-20] 16 (12/14 1138) BP: (92-142)/(45-71) 92/46 (12/14 1138) SpO2:  [90 %-98 %] 98 % (12/14 1138)  Intake/Output from previous day: No intake/output data recorded. Intake/Output this shift: No intake/output data recorded.  PE normal to in bed exam, minor LE edema seems stable  Lab Results: Lab Results  Component Value Date   WBC 10.0 09/15/2019   HGB 10.6 (L) 09/15/2019   HCT 34.9 (L) 09/15/2019   MCV 81.4 09/15/2019   PLT 384 09/15/2019   Lab Results  Component Value Date   INR 1.1 09/15/2019   BMET Lab Results  Component Value Date   NA 139 09/21/2019   K 3.8 09/21/2019   CL 96 (L) 09/21/2019   CO2 26 09/21/2019   GLUCOSE 106 (H) 09/21/2019   BUN 140 (H) 09/21/2019   CREATININE 4.17 (H) 09/21/2019   CALCIUM 8.5 (L) 09/21/2019    Studies/Results: No results found.  Assessment/Plan: 1. Doing well after lumbar fusion, but needs to be more mobile for D/C 2. Increased Cr - will ask nephrology to see today; celebrex stopped, torsemide as per PCP pre-op recs  Estimated body mass index is 43.22 kg/m as calculated from the following:   Height as of 09/15/19: 5\' 3"  (1.6 m).   Weight as of 09/15/19: 110.7 kg.    LOS: 3 days    [name] [name] 09/21/2019, 11:42 AM

## 2019-09-21 NOTE — Care Management Important Message (Signed)
Important Message  Patient Details  Name: [name] [name] MRN: [id number] Date of Birth: 08/10/1951   Medicare Important Message Given:  Yes     Lashunta Frieden 09/21/2019, 1:29 PM

## 2019-09-22 LAB — RENAL FUNCTION PANEL
Albumin: 2.4 g/dL — ABNORMAL LOW (ref 3.5–5.0)
Anion gap: 14 (ref 5–15)
BUN: 134 mg/dL — ABNORMAL HIGH (ref 8–23)
CO2: 26 mmol/L (ref 22–32)
Calcium: 8.3 mg/dL — ABNORMAL LOW (ref 8.9–10.3)
Chloride: 99 mmol/L (ref 98–111)
Creatinine, Ser: 3.81 mg/dL — ABNORMAL HIGH (ref 0.44–1.00)
GFR calc Af Amer: 13 mL/min — ABNORMAL LOW (ref >60)
GFR calc non Af Amer: 11 mL/min — ABNORMAL LOW (ref >60)
Glucose, Bld: 146 mg/dL — ABNORMAL HIGH (ref 70–99)
Phosphorus: 4 mg/dL (ref 2.5–4.6)
Potassium: 3.7 mmol/L (ref 3.5–5.1)
Sodium: 139 mmol/L (ref 135–145)

## 2019-09-22 LAB — GLUCOSE, CAPILLARY
Glucose-Capillary: 133 mg/dL — ABNORMAL HIGH (ref 70–99)
Glucose-Capillary: 142 mg/dL — ABNORMAL HIGH (ref 70–99)
Glucose-Capillary: 146 mg/dL — ABNORMAL HIGH (ref 70–99)
Glucose-Capillary: 154 mg/dL — ABNORMAL HIGH (ref 70–99)
Glucose-Capillary: 255 mg/dL — ABNORMAL HIGH (ref 70–99)
Glucose-Capillary: 256 mg/dL — ABNORMAL HIGH (ref 70–99)

## 2019-09-22 MED ORDER — INSULIN ASPART 100 UNIT/ML ~~LOC~~ SOLN
0.0000 [IU] | SUBCUTANEOUS | Status: DC
  Administered 2019-09-22: 11 [IU] via SUBCUTANEOUS

## 2019-09-22 MED ORDER — CLOPIDOGREL BISULFATE 75 MG PO TABS
75.0000 mg | ORAL_TABLET | Freq: Every day | ORAL | Status: DC
  Administered 2019-09-22: 75 mg via ORAL
  Filled 2019-09-22: qty 1

## 2019-09-22 MED ORDER — INSULIN ASPART 100 UNIT/ML ~~LOC~~ SOLN
0.0000 [IU] | SUBCUTANEOUS | Status: DC
  Administered 2019-09-22: 4 [IU] via SUBCUTANEOUS
  Administered 2019-09-22: 11 [IU] via SUBCUTANEOUS

## 2019-09-22 MED ORDER — OXYCODONE-ACETAMINOPHEN 10-325 MG PO TABS: 1.0000 | ORAL_TABLET | ORAL | 0 refills | Status: DC | PRN

## 2019-09-22 MED ORDER — INSULIN ASPART PROT & ASPART (70-30 MIX) 100 UNIT/ML ~~LOC~~ SUSP
20.0000 [IU] | Freq: Two times a day (BID) | SUBCUTANEOUS | Status: DC
  Administered 2019-09-22: 20 [IU] via SUBCUTANEOUS
  Filled 2019-09-22: qty 10

## 2019-09-22 NOTE — Discharge Summary (Signed)
Physician Discharge Summary  Patient ID: [name] [name] MRN: [id number] DOB/AGE: July 28, 1951 68 y.o.  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 The patient tolerated the procedure well and was taken to the recovery room and then to the   floor in stable condition. The hospital course was routine. There were no complications. The wound remained clean dry and intact. Pt had appropriate  back soreness. No complaints of  leg pain or new N/T/W. The patient remained afebrile with stable vital signs, and tolerated a regular diet. The patient continued to increase activities, and pain was well controlled with oral pain medications.  She was slow to mobilize but overall started to make improvements with therapy.  Nephrology saw her for her increased creatinine and gently hydrated her.  Creatinine responded nicely.  Consults: nephrology  Significant Diagnostic Studies:  Results for orders placed or performed during the hospital encounter of 09/18/19  Glucose, capillary  Result Value Ref Range   Glucose-Capillary 113 (H) 70 - 99 mg/dL  Glucose, capillary  Result Value Ref Range   Glucose-Capillary 133 (H) 70 - 99 mg/dL   Comment 1 Notify RN   Glucose, capillary  Result Value Ref Range   Glucose-Capillary 169 (H) 70 - 99 mg/dL  Glucose, capillary  Result Value Ref Range   Glucose-Capillary 313 (H) 70 - 99 mg/dL  Glucose, capillary  Result Value Ref Range   Glucose-Capillary 296 (H) 70 - 99 mg/dL  Glucose, capillary  Result Value Ref Range   Glucose-Capillary 134 (H) 70 - 99 mg/dL  Glucose, capillary  Result Value Ref Range   Glucose-Capillary 64 (L) 70 - 99 mg/dL  Glucose, capillary  Result Value Ref Range   Glucose-Capillary 129  (H) 70 - 99 mg/dL  Glucose, capillary  Result Value Ref Range   Glucose-Capillary 251 (H) 70 - 99 mg/dL  Glucose, capillary  Result Value Ref Range   Glucose-Capillary 359 (H) 70 - 99 mg/dL  Glucose, capillary  Result Value Ref Range   Glucose-Capillary 297 (H) 70 - 99 mg/dL  Glucose, capillary  Result Value Ref Range   Glucose-Capillary 118 (H) 70 - 99 mg/dL   Comment 1 Notify RN    Comment 2 Document in Chart   Glucose, capillary  Result Value Ref Range   Glucose-Capillary 151 (H) 70 - 99 mg/dL   Comment 1 Notify RN    Comment 2 Document in Chart   Glucose, capillary  Result Value Ref Range   Glucose-Capillary 139 (H) 70 - 99 mg/dL  Creatinine, serum  Result Value Ref Range   Creatinine, Ser 3.80 (H) 0.44 - 1.00 mg/dL   GFR calc non Af Amer 12 (L) >60 mL/min   GFR calc Af Amer 13 (L) >60 mL/min  Glucose, capillary  Result Value Ref Range   Glucose-Capillary 254 (H) 70 - 99 mg/dL  Glucose, capillary  Result Value Ref Range   Glucose-Capillary 338 (H) 70 - 99 mg/dL  Glucose, capillary  Result Value Ref Range   Glucose-Capillary 319 (H) 70 - 99 mg/dL   Comment 1 Notify RN   Glucose, capillary  Result Value Ref Range   Glucose-Capillary 203 (H) 70 - 99 mg/dL  Glucose, capillary  Result Value Ref Range   Glucose-Capillary 136 (H) 70 -  99 mg/dL  Glucose, capillary  Result Value Ref Range   Glucose-Capillary 106 (H) 70 - 99 mg/dL   Comment 1 Notify RN    Comment 2 Document in Chart   Basic metabolic panel  Result Value Ref Range   Sodium 139 135 - 145 mmol/L   Potassium 3.8 3.5 - 5.1 mmol/L   Chloride 96 (L) 98 - 111 mmol/L   CO2 26 22 - 32 mmol/L   Glucose, Bld 106 (H) 70 - 99 mg/dL   BUN 140 (H) 8 - 23 mg/dL   Creatinine, Ser 4.17 (H) 0.44 - 1.00 mg/dL   Calcium 8.5 (L) 8.9 - 10.3 mg/dL   GFR calc non Af Amer 10 (L) >60 mL/min   GFR calc Af Amer 12 (L) >60 mL/min   Anion gap 17 (H) 5 - 15  Glucose, capillary  Result Value Ref Range   Glucose-Capillary 308  (H) 70 - 99 mg/dL   Comment 1 Notify RN    Comment 2 Document in Chart   Glucose, capillary  Result Value Ref Range   Glucose-Capillary 323 (H) 70 - 99 mg/dL   Comment 1 Notify RN    Comment 2 Document in Chart   Urinalysis, Routine w reflex microscopic  Result Value Ref Range   Color, Urine YELLOW YELLOW   APPearance CLEAR CLEAR   Specific Gravity, Urine 1.009 1.005 - 1.030   pH 5.0 5.0 - 8.0   Glucose, UA 50 (A) NEGATIVE mg/dL   Hgb urine dipstick NEGATIVE NEGATIVE   Bilirubin Urine NEGATIVE NEGATIVE   Ketones, ur NEGATIVE NEGATIVE mg/dL   Protein, ur NEGATIVE NEGATIVE mg/dL   Nitrite NEGATIVE NEGATIVE   Leukocytes,Ua NEGATIVE NEGATIVE  Renal function panel  Result Value Ref Range   Sodium 139 135 - 145 mmol/L   Potassium 3.7 3.5 - 5.1 mmol/L   Chloride 99 98 - 111 mmol/L   CO2 26 22 - 32 mmol/L   Glucose, Bld 146 (H) 70 - 99 mg/dL   BUN 134 (H) 8 - 23 mg/dL   Creatinine, Ser 3.81 (H) 0.44 - 1.00 mg/dL   Calcium 8.3 (L) 8.9 - 10.3 mg/dL   Phosphorus 4.0 2.5 - 4.6 mg/dL   Albumin 2.4 (L) 3.5 - 5.0 g/dL   GFR calc non Af Amer 11 (L) >60 mL/min   GFR calc Af Amer 13 (L) >60 mL/min   Anion gap 14 5 - 15  Glucose, capillary  Result Value Ref Range   Glucose-Capillary 239 (H) 70 - 99 mg/dL  Glucose, capillary  Result Value Ref Range   Glucose-Capillary 133 (H) 70 - 99 mg/dL  Glucose, capillary  Result Value Ref Range   Glucose-Capillary 146 (H) 70 - 99 mg/dL  Glucose, capillary  Result Value Ref Range   Glucose-Capillary 142 (H) 70 - 99 mg/dL  Glucose, capillary  Result Value Ref Range   Glucose-Capillary 154 (H) 70 - 99 mg/dL  Glucose, capillary  Result Value Ref Range   Glucose-Capillary 256 (H) 70 - 99 mg/dL  Glucose, capillary  Result Value Ref Range   Glucose-Capillary 255 (H) 70 - 99 mg/dL    Chest 2 View  Result Date: 09/15/2019 CLINICAL DATA:  Preop evaluation for upcoming back surgery EXAM: CHEST - 2 VIEW COMPARISON:  04/08/2019, 08/10/2019 FINDINGS:  Cardiac shadow is stable. The known left lower lobe nodule is again seen and stable. No focal infiltrate or sizable effusion is seen. Degenerative changes of the thoracic spine are noted. IMPRESSION: No acute  abnormality noted.  Stable chronic changes as described. Electronically Signed   By: Inez Catalina M.D.   On: 09/15/2019 15:05   DG Lumbar Spine 2-3 Views  Result Date: 09/18/2019 CLINICAL DATA:  L4-5 fusion. FLUOROSCOPY TIME:  1 minutes 4 seconds. Images: Two EXAM: LUMBAR SPINE - 2-3 VIEW COMPARISON:  None. FINDINGS: There are pedicle rods and screws at L4 and L5. A disc spacer device is seen at the same levels, in good position. IMPRESSION: Hardware placement at L4-5 as above. Electronically Signed   By: Dorise Bullion III M.D   On: 09/18/2019 13:27   US RENAL  Result Date: 09/21/2019 CLINICAL DATA:  AKI, history of CKD, diabetes, hypertension and morbid obesity EXAM: RENAL / URINARY TRACT ULTRASOUND COMPLETE COMPARISON:  CT abdomen pelvis 05/06/2017, renal ultrasound 02/28/2015 FINDINGS: Right Kidney: Renal measurements: 10.1 x 5.3 x 5.5 cm = volume: 154.7 mL. 1.4 x 1.0 x 1.4 cm upper pole simple renal cyst. Additional 1.7 x 1.8 x 1.9 cm lower pole simple appearing cyst. Echogenicity within normal limits. No worrisome renal lesion, right urolithiasis or hydronephrosis. Left Kidney: Renal measurements: 13.6 x 5.8 x 5.8 cm = volume: 243.4 mL. Partially exophytic 1.6 x 2.1 x 1.8 cm anechoic cyst. Additional left lower pole 1.8 x 1.8 x 1.9 cm anechoic cyst. Echogenicity within normal limits. No worrisome mass, urolithiasis or hydronephrosis. Bladder: Appears normal for degree of bladder distention. Other: Imaging quality is suboptimal due to patient body habitus. IMPRESSION: Suboptimal imaging quality due to patient body habitus. Simple appearing bilateral renal cysts. Otherwise unremarkable renal ultrasound. Electronically Signed   By: Lovena Le M.D.   On: 09/21/2019 17:54   DG C-Arm 1-60  Min  Result Date: 09/18/2019 CLINICAL DATA:  Surgery. EXAM: DG C-ARM 1-60 MIN FLUOROSCOPY TIME:  Fluoroscopy Time:  1 minutes and 4 seconds Number of Acquired Spot Images: 2 COMPARISON:  None. FINDINGS: Pedicle rods and screws have been placed at L4-5 with a disc spacer device at the same level. IMPRESSION: Rods and screws at L4-5. Surgical hardware placement at L4-5 as above. Electronically Signed   By: Dorise Bullion III M.D   On: 09/18/2019 13:28    Antibiotics:  Anti-infectives (From admission, onward)   Start     Dose/Rate Route Frequency Ordered Stop   09/18/19 1700  ceFAZolin (ANCEF) IVPB 2g/100 mL premix     2 g 200 mL/hr over 30 Minutes Intravenous Every 12 hours 09/18/19 1611 09/19/19 0427   09/18/19 0931  bacitracin 50,000 Units in sodium chloride 0.9 % 500 mL irrigation  Status:  Discontinued       As needed 09/18/19 1052 09/18/19 1303   09/18/19 0815  ceFAZolin (ANCEF) IVPB 2g/100 mL premix  Status:  Discontinued     2 g 200 mL/hr over 30 Minutes Intravenous On call to O.R. 09/18/19 0806 09/18/19 1603      Discharge Exam: Blood pressure 137/67, pulse 84, temperature 98 F (36.7 C), temperature source Oral, resp. rate 15, SpO2 98 %. Neurologic: Grossly normal   Dressing dry  Discharge Medications:   Allergies as of 09/22/2019      Reactions   Nebivolol Swelling   Chest pain   Ace Inhibitors Swelling, Other (See Comments)   Tongue swell   Morphine And Related Itching      Medication List    TAKE these medications   albuterol (2.5 MG/3ML) 0.083% nebulizer solution Commonly known as: PROVENTIL Take 2.5 mg by nebulization every 6 (six) hours as needed for  wheezing or shortness of breath.   ProAir HFA 108 (90 Base) MCG/ACT inhaler Generic drug: albuterol Inhale 2 puffs into the lungs 4 (four) times daily as needed for wheezing or shortness of breath.   allopurinol 100 MG tablet Commonly known as: ZYLOPRIM Take 100 mg by mouth every morning.    budesonide-formoterol 160-4.5 MCG/ACT inhaler Commonly known as: SYMBICORT Inhale 2 puffs into the lungs 2 (two) times daily as needed (for SOB).   carvedilol 6.25 MG tablet Commonly known as: COREG Take 9.25 mg by mouth 2 (two) times daily.   cetirizine 10 MG tablet Commonly known as: ZYRTEC Take 10 mg by mouth daily.   cinacalcet 30 MG tablet Commonly known as: SENSIPAR Take 30 mg by mouth daily.   clopidogrel 75 MG tablet Commonly known as: PLAVIX TAKE 1 TABLET BY MOUTH EVERY DAY   Dulaglutide 1.5 MG/0.5ML Sopn Inject 1.5 mg into the skin every Monday.   fenofibrate 145 MG tablet Commonly known as: TRICOR TAKE 1 TABLET (145 MG TOTAL) BY MOUTH DAILY. What changed: See the new instructions.   fluticasone 50 MCG/ACT nasal spray Commonly known as: FLONASE Place 2 sprays into the nose as needed for allergies.   hydrOXYzine 25 MG tablet Commonly known as: ATARAX/VISTARIL Take 25 mg by mouth 2 (two) times daily.   insulin lispro protamine-lispro (75-25) 100 UNIT/ML Susp injection Commonly known as: HUMALOG 75/25 MIX Inject 35-40 Units into the skin See admin instructions. Inject 40 units into the skin morning and 35 units in the evening   levothyroxine 150 MCG tablet Commonly known as: SYNTHROID Take 150 mcg by mouth daily before breakfast.   metolazone 5 MG tablet Commonly known as: ZAROXOLYN Take 5 mg by mouth every Monday, Wednesday, and Friday.   multivitamin capsule Take 1 capsule by mouth daily. Use as directed   multivitamin with minerals Tabs tablet Take 1 tablet by mouth daily.   Myrbetriq 50 MG Tb24 tablet Generic drug: mirabegron ER Take 50 mg by mouth daily.   oxyCODONE-acetaminophen 10-325 MG tablet Commonly known as: PERCOCET Take 1 tablet by mouth every 4 (four) hours as needed for pain. What changed: when to take this   pantoprazole 40 MG tablet Commonly known as: PROTONIX Take 1 tablet (40 mg total) by mouth daily.   polyvinyl alcohol  1.4 % ophthalmic solution Commonly known as: LIQUIFILM TEARS Place 2 drops into both eyes daily as needed for dry eyes.   Potassium Chloride ER 20 MEQ Tbcr Take 20 mEq by mouth every Monday, Wednesday, and Friday.   simvastatin 20 MG tablet Commonly known as: ZOCOR Take 20 mg by mouth at bedtime.   tiZANidine 4 MG tablet Commonly known as: ZANAFLEX Take 4 mg by mouth 2 (two) times daily.   torsemide 20 MG tablet Commonly known as: DEMADEX Take 4 tablets (80 mg total) by mouth every morning AND 2 tablets (40 mg total) every evening. What changed: See the new instructions.   VITAMIN B COMPLEX PO Take 1 tablet by mouth 2 (two) times daily.   Vitamin D 50 MCG (2000 UT) Caps Take 2,000 Units by mouth daily.            Durable Medical Equipment  (From admission, onward)         Start     Ordered   09/18/19 1611  DME Walker rolling  Once    Question:  Patient needs a walker to treat with the following condition  Answer:  S/P lumbar fusion  09/18/19 1611   09/18/19 1611  DME 3 n 1  Once     09/18/19 1611          Disposition: [facility]   Final Dx: PLIF L4-5  Discharge Instructions    Call MD for:  difficulty breathing, headache or visual disturbances   Complete by: As directed    Call MD for:  persistant nausea and vomiting   Complete by: As directed    Call MD for:  redness, tenderness, or signs of infection (pain, swelling, redness, odor or green/yellow discharge around incision site)   Complete by: As directed    Call MD for:  severe uncontrolled pain   Complete by: As directed    Call MD for:  temperature >100.4   Complete by: As directed    Diet - low sodium heart healthy   Complete by: As directed    Increase activity slowly   Complete by: As directed    Remove dressing in 24 hours   Complete by: As directed       Follow-up Information    [facility] Follow up.   Why: The home health agency will contact you for the first home  visit. Contact information: [phone]       Eustace Moore, MD. Schedule an appointment as soon as possible for a visit in 2 week(s).   Specialty: Neurosurgery Contact information: [address] [place] [address] [phone]            Signed: Eustace Moore 09/22/2019, 5:02 PM

## 2019-09-22 NOTE — Progress Notes (Signed)
Physical Therapy Treatment Patient Details Name: [name] [name] MRN: [id number] DOB: October 30, 1950 Today's Date: 09/22/2019    History of Present Illness 68 y.o. female admitted with dynamic instability with stenosis L4-5. Pt now s/p L4-5 lumbar decompression and fusion. PMH significant for CHF, CKD, COPD, DM, gout, MRSA, PVD, TIA.    PT Comments    Pt hopeful to go home today, home activity level discussed with pt and her son who will be helping her at home. Discussed assist up stairs into home. Pt donned brace and pivoted to [facility] prior to ambulation, min-guard A for safety. Pt ambulated 65' with RW and min-guard A with 2/4 DOE and HR up to 113 bpm, SPO2 93%. Took 3 standing rest breaks to work on posture and to control dyspnea. PT will continue to follow.    Follow Up Recommendations  Home health PT;Supervision/Assistance - 24 hour     Equipment Recommendations  Rolling walker with 5" wheels    Recommendations for Other Services       Precautions / Restrictions Precautions Precautions: Fall;Back Precaution Booklet Issued: No Precaution Comments: verbalizes 3/3/ precautions Required Braces or Orthoses: Spinal Brace Spinal Brace: Lumbar corset;Applied in sitting position Restrictions Weight Bearing Restrictions: No    Mobility  Bed Mobility                  Transfers Overall transfer level: Needs assistance Equipment used: Rolling walker (2 wheeled) Transfers: Sit to/from [facility] Sit to Stand: Min assist Stand pivot transfers: Min guard       General transfer comment: min a to steady with first sit<>stand, min-guard with subsequent transfers  Ambulation/Gait Ambulation/Gait assistance: Min guard Gait Distance (Feet): 50 Feet Assistive device: Rolling walker (2 wheeled) Gait Pattern/deviations: Trunk flexed;Step-through pattern;Decreased stride length Gait velocity: reduced Gait velocity interpretation: <1.31 ft/sec, indicative of  household ambulator General Gait Details: pt cannot ambulate with erect posture so 3 standing rest breaks were taken to work on posture, let HR come down, and breathe. This helped overall posture nd pt's anxiety considerably   Stairs         General stair comments: discussed stair entry into home with pt and son. She did not want to practice today as she expects to d/c today and does not want to get too worn out   Wheelchair Mobility    Modified Rankin (Stroke Patients Only)       Balance Overall balance assessment: Needs assistance Sitting-balance support: No upper extremity supported;Feet supported Sitting balance-Leahy Scale: Good Sitting balance - Comments: supervision   Standing balance support: During functional activity;No upper extremity supported Standing balance-Leahy Scale: Fair Standing balance comment: able to pull up depends once therapist brought them to reachable level, with both hands, min-guard for safety                            Cognition Arousal/Alertness: Awake/alert Behavior During Therapy: WFL for tasks assessed/performed Overall Cognitive Status: Within Functional Limits for tasks assessed                                        Exercises      General Comments General comments (skin integrity, edema, etc.): HR up to 113 bpm with ambulation, SPO2 93%, 2/4 DOE. Discussed with pt and son the need for frequent ambulation (every hour) of short bouts due  to her reduced endurance. Both voiced understanding      Pertinent Vitals/Pain Pain Assessment: 0-10 Pain Score: 9  Pain Location: back Pain Descriptors / Indicators: Aching;Grimacing;Guarding Pain Intervention(s): Limited activity within patient's tolerance;Monitored during session    Home Living                      Prior Function            PT Goals (current goals can now be found in the care plan section) Acute Rehab PT Goals Patient Stated Goal: to  not be in pain PT Goal Formulation: With patient Time For Goal Achievement: 10/02/19 Potential to Achieve Goals: Good Progress towards PT goals: Progressing toward goals    Frequency    Min 5X/week      PT Plan Current plan remains appropriate    Co-evaluation              AM-PAC PT "6 Clicks" Mobility   Outcome Measure  Help needed turning from your back to your side while in a flat bed without using bedrails?: A Little Help needed moving from lying on your back to sitting on the side of a flat bed without using bedrails?: A Little Help needed moving to and from a bed to a chair (including a wheelchair)?: A Little Help needed standing up from a chair using your arms (e.g., wheelchair or bedside chair)?: A Little Help needed to walk in hospital room?: A Little Help needed climbing 3-5 steps with a railing? : A Little 6 Click Score: 18    End of Session Equipment Utilized During Treatment: Back brace Activity Tolerance: Patient limited by fatigue Patient left: in chair;with call bell/phone within reach;with family/visitor present Nurse Communication: Mobility status PT Visit Diagnosis: Other abnormalities of gait and mobility (R26.89);Pain;Difficulty in walking, not elsewhere classified (R26.2) Pain - part of body: (back)     Time: 2336-1224 PT Time Calculation (min) (ACUTE ONLY): 28 min  Charges:  $Gait Training: 8-22 mins $Therapeutic Activity: 8-22 mins                     Leighton Roach, [place]  Pager [phone] Office [place] 09/22/2019, 1:24 PM

## 2019-09-22 NOTE — Progress Notes (Signed)
[facility] NEPHROLOGY PROGRESS NOTE  Assessment/ Plan: Pt is a 68 y.o. 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 Ronnald Ramp for AKI on CKD.  #Acute kidney injury on CKD stage IV likely hemodynamically mediated post surgery concomitant with use of NSAIDs and diuretics.  She looks euvolemic on exam.  Received IVF for about 10 hours.  Improvement in both BUN and creatinine level.  No sign of uremia and close to euvolemia today.  Continue to hold torsemide and monitor lab. UA and US kidneys unremarkable. Nonoliguric. Discontinued NSAID and advised to avoid any nephrotoxins.  No indication for dialysis.  #Hypertension: Blood pressure acceptable.  On Coreg.  #Spinal stenosis status post decompressive lumbar laminectomy: Per neurosurgery.  It seems like she is recovering well.  #Hyperparathyroidism with parathyroid adenoma: Currently on Sensipar.  Monitor calcium level.  #Anemia due to CKD and blood loss: Monitor hemoglobin.  No sign of active bleed.  Subjective: Seen and examined at bedside.  No new event.  Denies nausea, vomiting, chest pain, shortness of breath. Objective Vital signs in last 24 hours: Vitals:   09/22/19 0043 09/22/19 0342 09/22/19 0742 09/22/19 0805  BP: 136/70 (!) 121/46 (!) 157/85   Pulse: 86 89 92   Resp: 20 18 19    Temp: 98.2 F (36.8 C) 98.4 F (36.9 C) 98.8 F (37.1 C)   TempSrc: Oral Oral Oral   SpO2: 96% 94% 97% 95%   Weight change:   Intake/Output Summary (Last 24 hours) at 09/22/2019 1057 Last data filed at 09/22/2019 0641 Gross per 24 hour  Intake 674.65 ml  Output 1200 ml  Net -525.35 ml       Labs: Basic Metabolic Panel: Recent Labs  Lab 09/15/19 1058 09/20/19 1021 09/21/19 0755 09/22/19 0328  NA  138  --  139 139  K 3.3*  --  3.8 3.7  CL 92*  --  96* 99  CO2 29  --  26 26  GLUCOSE 167*  --  106* 146*  BUN 127*  --  140* 134*  CREATININE 3.08* 3.80* 4.17* 3.81*  CALCIUM 9.6  --  8.5* 8.3*  PHOS  --   --   --  4.0   Liver Function Tests: Recent Labs  Lab 09/22/19 0328  ALBUMIN 2.4*   No results for input(s): LIPASE, AMYLASE in the last 168 hours. No results for input(s): AMMONIA in the last 168 hours. CBC: Recent Labs  Lab 09/15/19 1058  WBC 10.0  NEUTROABS 7.5  HGB 10.6*  HCT 34.9*  MCV 81.4  PLT 384   Cardiac Enzymes: No results for input(s): CKTOTAL, CKMB, CKMBINDEX, TROPONINI in the last 168 hours. CBG: Recent Labs  Lab 09/21/19 1927 09/22/19 0010 09/22/19 0133 09/22/19 0338 09/22/19 0725  GLUCAP 239* 133* 146* 142* 154*    Iron Studies: No results for input(s): IRON, TIBC, TRANSFERRIN, FERRITIN in the last 72 hours. Studies/Results: US RENAL  Result Date: 09/21/2019 CLINICAL DATA:  AKI, history of CKD, diabetes, hypertension and morbid obesity EXAM: RENAL / URINARY TRACT ULTRASOUND COMPLETE COMPARISON:  CT abdomen pelvis 05/06/2017, renal ultrasound 02/28/2015 FINDINGS: Right Kidney: Renal measurements: 10.1 x 5.3 x 5.5 cm = volume: 154.7 mL. 1.4 x 1.0 x 1.4 cm upper pole simple renal cyst. Additional 1.7 x 1.8 x 1.9 cm lower pole  simple appearing cyst. Echogenicity within normal limits. No worrisome renal lesion, right urolithiasis or hydronephrosis. Left Kidney: Renal measurements: 13.6 x 5.8 x 5.8 cm = volume: 243.4 mL. Partially exophytic 1.6 x 2.1 x 1.8 cm anechoic cyst. Additional left lower pole 1.8 x 1.8 x 1.9 cm anechoic cyst. Echogenicity within normal limits. No worrisome mass, urolithiasis or hydronephrosis. Bladder: Appears normal for degree of bladder distention. Other: Imaging quality is suboptimal due to patient body habitus. IMPRESSION: Suboptimal imaging quality due to patient body habitus. Simple appearing bilateral renal cysts. Otherwise  unremarkable renal ultrasound. Electronically Signed   By: Lovena Le M.D.   On: 09/21/2019 17:54    Medications: Infusions: . sodium chloride      Scheduled Medications: . allopurinol  100 mg Oral Daily  . aspirin EC  81 mg Oral Daily  . B-complex with vitamin C  1 tablet Oral BID  . carvedilol  9.375 mg Oral BID  . Chlorhexidine Gluconate Cloth  6 each Topical Daily  . cholecalciferol  2,000 Units Oral Daily  . cinacalcet  30 mg Oral Q breakfast  . clopidogrel  75 mg Oral Daily  . fenofibrate  54 mg Oral Daily  . hydrOXYzine  25 mg Oral BID  . insulin aspart  0-5 Units Subcutaneous Q4H  . insulin aspart protamine- aspart  20 Units Subcutaneous BID WC  . levothyroxine  150 mcg Oral QAC breakfast  . mirabegron ER  50 mg Oral Daily  . mometasone-formoterol  2 puff Inhalation BID  . pantoprazole  40 mg Oral Daily  . senna  1 tablet Oral BID  . sodium chloride flush  3 mL Intravenous Q12H  . tiZANidine  4 mg Oral BID    have reviewed scheduled and prn medications.  Physical Exam: General:NAD, comfortable Heart:RRR, s1s2 nl, no rubs Lungs:clear b/l, no crackle Abdomen:soft, Non-tender, non-distended Extremities:No edema Neurology: Alert, awake and following commands  [name] [name] 09/22/2019,10:57 AM  LOS: 4 days  Pager: [phone]

## 2019-09-22 NOTE — TOC Progression Note (Signed)
Transition of Care [facility]) - Progression Note    Patient Details  Name: [name] [name] MRN: [id number] Date of Birth: 29-Mar-1951  Transition of Care [facility]) CM/SW [place], Junior Work Phone Number: 09/22/2019, 10:28 AM  Clinical Narrative:    MSW Intern met with PT to discuss discharge planning. Pt was adamant that she did not want to go to SNF and would prefer to be home. PT lives alone, but has support from her son and an aide. HH and DME have been set up. SW will continue to follow.  Expected Discharge Plan: (TBD pending PT/OT eval) Barriers to Discharge: Continued Medical Work up  Expected Discharge Plan and Services Expected Discharge Plan: (TBD pending PT/OT eval)   Discharge Planning Services: CM Consult Post Acute Care Choice: Durable Medical Equipment, Home Health                   DME Arranged: 3-N-1, Walker rolling DME Agency: [employer] Date DME Agency Contacted: 09/21/19   Representative spoke with at DME Agency: [place]: PT, OT [place] Agency: [place] ([place]) Date [place]: 09/21/19   Representative spoke with at [place]: [place] ([place]) Interventions    Readmission Risk Interventions No flowsheet data found.

## 2019-09-22 NOTE — Progress Notes (Signed)
Pt given discharge summary and pt discharged home via son as transportation.

## 2019-09-22 NOTE — Progress Notes (Signed)
Occupational Therapy Treatment Patient Details Name: [name] [name] MRN: [id number] DOB: [birth year] Today's Date: 09/22/2019    History of present illness 68 y.o. female admitted with dynamic instability with stenosis L4-5. Pt now s/p L4-5 lumbar decompression and fusion. PMH significant for CHF, CKD, COPD, DM, gout, MRSA, PVD, TIA.   OT comments  Pt making steady progress towards OT goals this session. Overall, pt requires Min guard- MIN A for functional mobility and transfers, min guard for standing groomng. Pt limited by generalized weakness and pain needing to take 1 seated reat beak during household distance functional mobility. Education provided on all AE for LB dressing with pt and son verbalizing understanding. Pt reports having aid at home to assist with all  ADLs. Reinforced all back precautions in relation to ADL routine with pt able to correctly problem solve safe situations at home. Pt hopeful to DC home today, DC plan remains appropriate, will continue to follow acutely per POC.     Follow Up Recommendations  Home health OT;Supervision/Assistance - 24 hour    Equipment Recommendations  3 in 1 bedside commode    Recommendations for Other Services      Precautions / Restrictions Precautions Precautions: Fall;Back Precaution Booklet Issued: No Precaution Comments: verbalizes 3/3/ precautions Required Braces or Orthoses: Spinal Brace Spinal Brace: Lumbar corset;Applied in sitting position Restrictions Weight Bearing Restrictions: No       Mobility Bed Mobility               General bed mobility comments: OOB in recliner but visually demo'ed use of log roll for bed mobility  Transfers Overall transfer level: Needs assistance Equipment used: Rolling walker (2 wheeled) Transfers: Sit to/from Stand Sit to Stand: Min guard;Min assist Stand pivot transfers: Min guard       General transfer comment: min guard from recliner, light min A for sit>stand from [facility],  cues for technique    Balance Overall balance assessment: Needs assistance Sitting-balance support: No upper extremity supported;Feet supported Sitting balance-Leahy Scale: Good Sitting balance - Comments: supervision   Standing balance support: Bilateral upper extremity supported Standing balance-Leahy Scale: Poor Standing balance comment: reliant on BUE support                           ADL either performed or assessed with clinical judgement   ADL Overall ADL's : Needs assistance/impaired     Grooming: Wash/dry hands;Standing;Supervision/safety         Lower Body Bathing Details (indicate cue type and reason): education on using LH sponge for LB bathing       Lower Body Dressing Details (indicate cue type and reason): education on LB AE for dressing with pt reporting her aid assists with LB dressing Toilet Transfer: Ambulation;RW;Min guard;Minimal assistance;BSC Toilet Transfer Details (indicate cue type and reason): MIN guard for functional mobility with RW to BSC, MIN A for balance with initial sit>stand Toileting- Clothing Manipulation and Hygiene: Minimal assistance;Sit to/from stand Toileting - Clothing Manipulation Details (indicate cue type and reason): MIN A to manage gown during pericare     Functional mobility during ADLs: Min guard;Rolling walker;Minimal assistance General ADL Comments: session focus on LB AE for bathing and dressing and incorporating back precautions into ADL routine. overall pt requires min guard- MIN A for functional mobility and transfers     Vision   Vision Assessment?: No apparent visual deficits   Perception     Praxis  Cognition Arousal/Alertness: Awake/alert Behavior During Therapy: WFL for tasks assessed/performed Overall Cognitive Status: Within Functional Limits for tasks assessed                                 General Comments: able to recall 3/3 precautions, able to reason through  hypothetical scenarios to adhere to precautions upon return home        Exercises     Shoulder Instructions       General Comments VSS, reported fatigue during functional mobility needing to sit down. HR 87 O2 98 on RA    Pertinent Vitals/ Pain       Pain Assessment: 0-10 Pain Score: 7  Pain Location: back Pain Descriptors / Indicators: Aching;Grimacing;Guarding Pain Intervention(s): Limited activity within patient's tolerance;Monitored during session;Repositioned  Home Living                                          Prior Functioning/Environment              Frequency  Min 2X/week        Progress Toward Goals  OT Goals(current goals can now be found in the care plan section)  Progress towards OT goals: Progressing toward goals  Acute Rehab OT Goals Patient Stated Goal: to not be in pain OT Goal Formulation: With patient Time For Goal Achievement: 10/03/19 Potential to Achieve Goals: Good  Plan Discharge plan remains appropriate    Co-evaluation                 AM-PAC OT "6 Clicks" Daily Activity     Outcome Measure   Help from another person eating meals?: None Help from another person taking care of personal grooming?: A Little Help from another person toileting, which includes using toliet, bedpan, or urinal?: A Lot Help from another person bathing (including washing, rinsing, drying)?: A Lot Help from another person to put on and taking off regular upper body clothing?: A Little Help from another person to put on and taking off regular lower body clothing?: A Lot 6 Click Score: 16    End of Session Equipment Utilized During Treatment: Rolling walker;Back brace  OT Visit Diagnosis: Other abnormalities of gait and mobility (R26.89);Pain   Activity Tolerance Patient tolerated treatment well   Patient Left in chair;with call bell/phone within reach;with family/visitor present   Nurse Communication Mobility status         Time: 7510-2585 OT Time Calculation (min): 40 min  Charges: OT General Charges $OT Visit: 1 Visit OT Treatments $Self Care/Home Management : 38-52 mins  Lanier Clam., COTA/L Acute Rehabilitation Services [phone]    Ihor Gully 09/22/2019, 4:54 PM

## 2019-09-22 NOTE — Progress Notes (Signed)
Subjective: Patient reports pain is better, but still sore, leg seems better, c/o generalized weakness  Objective: Vital signs in last 24 hours: Temp:  [96.3 F (35.7 C)-98.8 F (37.1 C)] 98.8 F (37.1 C) (12/15 0742) Pulse Rate:  [77-92] 92 (12/15 0742) Resp:  [16-20] 19 (12/15 0742) BP: (92-157)/(46-85) 157/85 (12/15 0742) SpO2:  [94 %-98 %] 97 % (12/15 0742)  Intake/Output from previous day: 12/14 0701 - 12/15 0700 In: 674.7 [I.V.:674.7] Out: 1200 [Urine:1200] Intake/Output this shift: No intake/output data recorded.  seems normal on exam  Lab Results: Lab Results  Component Value Date   WBC 10.0 09/15/2019   HGB 10.6 (L) 09/15/2019   HCT 34.9 (L) 09/15/2019   MCV 81.4 09/15/2019   PLT 384 09/15/2019   Lab Results  Component Value Date   INR 1.1 09/15/2019   BMET Lab Results  Component Value Date   NA 139 09/22/2019   K 3.7 09/22/2019   CL 99 09/22/2019   CO2 26 09/22/2019   GLUCOSE 146 (H) 09/22/2019   BUN 134 (H) 09/22/2019   CREATININE 3.81 (H) 09/22/2019   CALCIUM 8.3 (L) 09/22/2019    Studies/Results: US RENAL  Result Date: 09/21/2019 CLINICAL DATA:  AKI, history of CKD, diabetes, hypertension and morbid obesity EXAM: RENAL / URINARY TRACT ULTRASOUND COMPLETE COMPARISON:  CT abdomen pelvis 05/06/2017, renal ultrasound 02/28/2015 FINDINGS: Right Kidney: Renal measurements: 10.1 x 5.3 x 5.5 cm = volume: 154.7 mL. 1.4 x 1.0 x 1.4 cm upper pole simple renal cyst. Additional 1.7 x 1.8 x 1.9 cm lower pole simple appearing cyst. Echogenicity within normal limits. No worrisome renal lesion, right urolithiasis or hydronephrosis. Left Kidney: Renal measurements: 13.6 x 5.8 x 5.8 cm = volume: 243.4 mL. Partially exophytic 1.6 x 2.1 x 1.8 cm anechoic cyst. Additional left lower pole 1.8 x 1.8 x 1.9 cm anechoic cyst. Echogenicity within normal limits. No worrisome mass, urolithiasis or hydronephrosis. Bladder: Appears normal for degree of bladder distention. Other:  Imaging quality is suboptimal due to patient body habitus. IMPRESSION: Suboptimal imaging quality due to patient body habitus. Simple appearing bilateral renal cysts. Otherwise unremarkable renal ultrasound. Electronically Signed   By: Lovena Le M.D.   On: 09/21/2019 17:54    Assessment/Plan: Pain somewhat better. Appreciate nephrology help. Cr better. Still slow to mobilize and I think she needs snf to get stronger  Estimated body mass index is 43.22 kg/m as calculated from the following:   Height as of 09/15/19: 5\' 3"  (1.6 m).   Weight as of 09/15/19: 110.7 kg.    LOS: 4 days    Eustace Moore 09/22/2019, 7:50 AM

## 2019-09-22 NOTE — TOC Transition Note (Signed)
Transition of Care [facility]) - CM/SW Discharge Note   Patient Details  Name: [name] [name] MRN: [id number] Date of Birth: 02/07/1951  [facility]) CM/SW Contact:  Pollie Friar, RN Phone Number: 09/22/2019, 4:15 PM   Clinical Narrative:    CM met with the patient and her son. They asked about SNF rehab and only staying a week. CM went over that it is up to the rehab if she is ready in a week to d/c. CM also updated them on the no-visitor policy for the SNFs as per their request. They have decided to d/c home with [facility] services. Son is able to provide most of the needed supervision. She also has an aide 3 hours a day and a neighbor that can assist as needed.  DME at the bedside for home.  Son can provide transport home.   Final next level of care: Home w Home Health Services Barriers to Discharge: No Barriers Identified   Patient Goals and CMS Choice   CMS Medicare.gov Compare Post Acute Care list provided to:: Patient Choice offered to / list presented to : Patient  Discharge Placement                       Discharge Plan and Services   Discharge Planning Services: CM Consult Post Acute Care Choice: Durable Medical Equipment, Home Health          DME Arranged: 3-N-1, Walker rolling DME Agency: [employer] Date DME Agency Contacted: 09/21/19   Representative spoke with at DME Agency: [place]: PT, OT [place] Agency: [place] ([place]) Date [place]: 09/21/19   Representative spoke with at [place]: [place] ([place]) Interventions     Readmission Risk Interventions No flowsheet data found.

## 2019-09-25 ENCOUNTER — Encounter: Payer: Self-pay | Admitting: *Deleted

## 2019-09-28 NOTE — Anesthesia Postprocedure Evaluation (Signed)
Anesthesia Post Note  Patient: [name] [name]  Procedure(s) Performed: Posterior Lumbar Interbody Fusion- Lumbar four-Lumbar five (N/A Back)     Patient location during evaluation: PACU Anesthesia Type: General Level of consciousness: awake and alert Pain management: pain level controlled Vital Signs Assessment: post-procedure vital signs reviewed and stable Respiratory status: spontaneous breathing, nonlabored ventilation, respiratory function stable and patient connected to nasal cannula oxygen Cardiovascular status: blood pressure returned to baseline and stable Postop Assessment: no apparent nausea or vomiting Anesthetic complications: no    Last Vitals:  Vitals:   09/22/19 0805 09/22/19 1150  BP:  137/67  Pulse:  84  Resp:  15  Temp:  36.7 C  SpO2: 95% 98%    Last Pain:  Vitals:   09/22/19 1150  TempSrc: Oral  PainSc:                  Effie Berkshire

## 2019-10-26 ENCOUNTER — Encounter (HOSPITAL_COMMUNITY): Payer: Self-pay | Admitting: Emergency Medicine

## 2019-10-26 ENCOUNTER — Observation Stay (HOSPITAL_COMMUNITY)
Admission: EM | Admit: 2019-10-26 | Discharge: 2019-10-27 | Disposition: A | Discharge status: Home / Self Care | Payer: Medicare Other | Attending: Internal Medicine | Admitting: Internal Medicine

## 2019-10-26 ENCOUNTER — Other Ambulatory Visit: Payer: Self-pay

## 2019-10-26 DIAGNOSIS — E1122 Type 2 diabetes mellitus with diabetic chronic kidney disease: Secondary | ICD-10-CM | POA: Insufficient documentation

## 2019-10-26 DIAGNOSIS — E785 Hyperlipidemia, unspecified: Secondary | ICD-10-CM | POA: Diagnosis not present

## 2019-10-26 DIAGNOSIS — D631 Anemia in chronic kidney disease: Secondary | ICD-10-CM | POA: Diagnosis not present

## 2019-10-26 DIAGNOSIS — Z90710 Acquired absence of both cervix and uterus: Secondary | ICD-10-CM | POA: Insufficient documentation

## 2019-10-26 DIAGNOSIS — E119 Type 2 diabetes mellitus without complications: Secondary | ICD-10-CM

## 2019-10-26 DIAGNOSIS — G894 Chronic pain syndrome: Secondary | ICD-10-CM | POA: Diagnosis not present

## 2019-10-26 DIAGNOSIS — D649 Anemia, unspecified: Secondary | ICD-10-CM | POA: Diagnosis present

## 2019-10-26 DIAGNOSIS — I5023 Acute on chronic systolic (congestive) heart failure: Secondary | ICD-10-CM | POA: Diagnosis not present

## 2019-10-26 DIAGNOSIS — Z79899 Other long term (current) drug therapy: Secondary | ICD-10-CM | POA: Insufficient documentation

## 2019-10-26 DIAGNOSIS — E1151 Type 2 diabetes mellitus with diabetic peripheral angiopathy without gangrene: Secondary | ICD-10-CM | POA: Insufficient documentation

## 2019-10-26 DIAGNOSIS — J449 Chronic obstructive pulmonary disease, unspecified: Secondary | ICD-10-CM | POA: Insufficient documentation

## 2019-10-26 DIAGNOSIS — Z992 Dependence on renal dialysis: Secondary | ICD-10-CM | POA: Insufficient documentation

## 2019-10-26 DIAGNOSIS — Z86010 Personal history of colonic polyps: Secondary | ICD-10-CM | POA: Insufficient documentation

## 2019-10-26 DIAGNOSIS — M1990 Unspecified osteoarthritis, unspecified site: Secondary | ICD-10-CM | POA: Diagnosis not present

## 2019-10-26 DIAGNOSIS — Z809 Family history of malignant neoplasm, unspecified: Secondary | ICD-10-CM | POA: Insufficient documentation

## 2019-10-26 DIAGNOSIS — Z8673 Personal history of transient ischemic attack (TIA), and cerebral infarction without residual deficits: Secondary | ICD-10-CM | POA: Diagnosis not present

## 2019-10-26 DIAGNOSIS — Z9049 Acquired absence of other specified parts of digestive tract: Secondary | ICD-10-CM | POA: Insufficient documentation

## 2019-10-26 DIAGNOSIS — K219 Gastro-esophageal reflux disease without esophagitis: Secondary | ICD-10-CM | POA: Diagnosis not present

## 2019-10-26 DIAGNOSIS — I130 Hypertensive heart and chronic kidney disease with heart failure and stage 1 through stage 4 chronic kidney disease, or unspecified chronic kidney disease: Secondary | ICD-10-CM | POA: Diagnosis not present

## 2019-10-26 DIAGNOSIS — I5022 Chronic systolic (congestive) heart failure: Secondary | ICD-10-CM | POA: Diagnosis not present

## 2019-10-26 DIAGNOSIS — Z20822 Contact with and (suspected) exposure to covid-19: Secondary | ICD-10-CM | POA: Insufficient documentation

## 2019-10-26 DIAGNOSIS — N184 Chronic kidney disease, stage 4 (severe): Secondary | ICD-10-CM | POA: Diagnosis not present

## 2019-10-26 DIAGNOSIS — Z7902 Long term (current) use of antithrombotics/antiplatelets: Secondary | ICD-10-CM | POA: Insufficient documentation

## 2019-10-26 DIAGNOSIS — Z833 Family history of diabetes mellitus: Secondary | ICD-10-CM | POA: Insufficient documentation

## 2019-10-26 DIAGNOSIS — I509 Heart failure, unspecified: Secondary | ICD-10-CM

## 2019-10-26 DIAGNOSIS — E890 Postprocedural hypothyroidism: Secondary | ICD-10-CM | POA: Insufficient documentation

## 2019-10-26 DIAGNOSIS — M109 Gout, unspecified: Secondary | ICD-10-CM | POA: Diagnosis not present

## 2019-10-26 DIAGNOSIS — I10 Essential (primary) hypertension: Secondary | ICD-10-CM | POA: Diagnosis present

## 2019-10-26 DIAGNOSIS — Z8614 Personal history of Methicillin resistant Staphylococcus aureus infection: Secondary | ICD-10-CM | POA: Insufficient documentation

## 2019-10-26 DIAGNOSIS — I5043 Acute on chronic combined systolic (congestive) and diastolic (congestive) heart failure: Secondary | ICD-10-CM

## 2019-10-26 DIAGNOSIS — Z888 Allergy status to other drugs, medicaments and biological substances status: Secondary | ICD-10-CM | POA: Insufficient documentation

## 2019-10-26 DIAGNOSIS — Z794 Long term (current) use of insulin: Secondary | ICD-10-CM

## 2019-10-26 DIAGNOSIS — F17210 Nicotine dependence, cigarettes, uncomplicated: Secondary | ICD-10-CM | POA: Diagnosis not present

## 2019-10-26 DIAGNOSIS — Z6841 Body Mass Index (BMI) 40.0 and over, adult: Secondary | ICD-10-CM | POA: Insufficient documentation

## 2019-10-26 DIAGNOSIS — Z7951 Long term (current) use of inhaled steroids: Secondary | ICD-10-CM | POA: Insufficient documentation

## 2019-10-26 DIAGNOSIS — Z885 Allergy status to narcotic agent status: Secondary | ICD-10-CM | POA: Insufficient documentation

## 2019-10-26 DIAGNOSIS — E039 Hypothyroidism, unspecified: Secondary | ICD-10-CM | POA: Diagnosis present

## 2019-10-26 DIAGNOSIS — G4733 Obstructive sleep apnea (adult) (pediatric): Secondary | ICD-10-CM | POA: Diagnosis not present

## 2019-10-26 DIAGNOSIS — Z8249 Family history of ischemic heart disease and other diseases of the circulatory system: Secondary | ICD-10-CM | POA: Insufficient documentation

## 2019-10-26 LAB — COMPREHENSIVE METABOLIC PANEL
ALT: 13 U/L (ref 0–44)
AST: 25 U/L (ref 15–41)
Albumin: 3.1 g/dL — ABNORMAL LOW (ref 3.5–5.0)
Alkaline Phosphatase: 64 U/L (ref 38–126)
Anion gap: 15 (ref 5–15)
BUN: 104 mg/dL — ABNORMAL HIGH (ref 8–23)
CO2: 30 mmol/L (ref 22–32)
Calcium: 9 mg/dL (ref 8.9–10.3)
Chloride: 93 mmol/L — ABNORMAL LOW (ref 98–111)
Creatinine, Ser: 3.26 mg/dL — ABNORMAL HIGH (ref 0.44–1.00)
GFR calc Af Amer: 16 mL/min — ABNORMAL LOW (ref >60)
GFR calc non Af Amer: 14 mL/min — ABNORMAL LOW (ref >60)
Glucose, Bld: 178 mg/dL — ABNORMAL HIGH (ref 70–99)
Potassium: 3.7 mmol/L (ref 3.5–5.1)
Sodium: 138 mmol/L (ref 135–145)
Total Bilirubin: 0.4 mg/dL (ref 0.3–1.2)
Total Protein: 6.9 g/dL (ref 6.5–8.1)

## 2019-10-26 LAB — CBC WITH DIFFERENTIAL/PLATELET
Abs Immature Granulocytes: 0.09 10*3/uL — ABNORMAL HIGH (ref 0.00–0.07)
Basophils Absolute: 0 10*3/uL (ref 0.0–0.1)
Basophils Relative: 0 %
Eosinophils Absolute: 0.2 10*3/uL (ref 0.0–0.5)
Eosinophils Relative: 1 %
HCT: 24.1 % — ABNORMAL LOW (ref 36.0–46.0)
Hemoglobin: 7.1 g/dL — ABNORMAL LOW (ref 12.0–15.0)
Immature Granulocytes: 1 %
Lymphocytes Relative: 17 %
Lymphs Abs: 2.1 10*3/uL (ref 0.7–4.0)
MCH: 24.2 pg — ABNORMAL LOW (ref 26.0–34.0)
MCHC: 29.5 g/dL — ABNORMAL LOW (ref 30.0–36.0)
MCV: 82.3 fL (ref 80.0–100.0)
Monocytes Absolute: 0.8 10*3/uL (ref 0.1–1.0)
Monocytes Relative: 6 %
Neutro Abs: 9.4 10*3/uL — ABNORMAL HIGH (ref 1.7–7.7)
Neutrophils Relative %: 75 %
Platelets: 471 10*3/uL — ABNORMAL HIGH (ref 150–400)
RBC: 2.93 MIL/uL — ABNORMAL LOW (ref 3.87–5.11)
RDW: 17.7 % — ABNORMAL HIGH (ref 11.5–15.5)
WBC: 12.6 10*3/uL — ABNORMAL HIGH (ref 4.0–10.5)
nRBC: 0 % (ref 0.0–0.2)

## 2019-10-26 LAB — PREPARE RBC (CROSSMATCH)

## 2019-10-26 LAB — POC OCCULT BLOOD, ED: Fecal Occult Bld: NEGATIVE

## 2019-10-26 MED ORDER — ACETAMINOPHEN 325 MG PO TABS
650.0000 mg | ORAL_TABLET | Freq: Four times a day (QID) | ORAL | Status: DC | PRN
  Administered 2019-10-27: 02:00:00 650 mg via ORAL
  Filled 2019-10-26: qty 2

## 2019-10-26 MED ORDER — INSULIN ASPART 100 UNIT/ML ~~LOC~~ SOLN
0.0000 [IU] | Freq: Every day | SUBCUTANEOUS | Status: DC
  Administered 2019-10-27: 02:00:00 2 [IU] via SUBCUTANEOUS

## 2019-10-26 MED ORDER — SODIUM CHLORIDE 0.9% FLUSH
3.0000 mL | Freq: Two times a day (BID) | INTRAVENOUS | Status: DC
  Administered 2019-10-27: 01:00:00 3 mL via INTRAVENOUS

## 2019-10-26 MED ORDER — ACETAMINOPHEN 650 MG RE SUPP: 650.0000 mg | Freq: Four times a day (QID) | RECTAL | Status: DC | PRN

## 2019-10-26 MED ORDER — INSULIN ASPART 100 UNIT/ML ~~LOC~~ SOLN
0.0000 [IU] | Freq: Three times a day (TID) | SUBCUTANEOUS | Status: DC
  Administered 2019-10-27: 2 [IU] via SUBCUTANEOUS

## 2019-10-26 MED ORDER — SODIUM CHLORIDE 0.9 % IV SOLN: 250.0000 mL | INTRAVENOUS | Status: DC | PRN

## 2019-10-26 MED ORDER — FUROSEMIDE 10 MG/ML IJ SOLN
40.0000 mg | Freq: Once | INTRAMUSCULAR | Status: AC
  Administered 2019-10-27: 40 mg via INTRAVENOUS
  Filled 2019-10-26: qty 4

## 2019-10-26 MED ORDER — SODIUM CHLORIDE 0.9% FLUSH: 3.0000 mL | INTRAVENOUS | Status: DC | PRN

## 2019-10-26 MED ORDER — SODIUM CHLORIDE 0.9 % IV SOLN
10.0000 mL/h | Freq: Once | INTRAVENOUS | Status: AC
  Administered 2019-10-27: 01:00:00 10 mL/h via INTRAVENOUS

## 2019-10-26 NOTE — ED Triage Notes (Signed)
Pt st's she had blood drawn earlier today at her MD's office and was called and told to come to ED ref. Low hgb

## 2019-10-26 NOTE — H&P (Signed)
TRH H&P    Patient Demographics:    Zavia Pullen, is a 69 y.o. female  MRN: [id number]  DOB - [birth year]  Admit Date - 10/26/2019  Referring MD/NP/PA:  Vivi Martens  Outpatient Primary MD for the patient is [name], MD  Patient coming from:  home  Chief complaint-   anemia   HPI:    Carmine Youngberg  is a 69 y.o. female,  w  Hypothyroidism, Gout hypertension, hyperlipidemia, Dm2,  CKD stage 4, CHF(EF 40-45%)  H/o TIA, Asthma/ Copd, pulmonary nodule left lower lung , OSA, w recent back surgery 09/18/19, presents with c/o anemia, sent by her PCP to ED for evaluation.  Pt denies n/v, abd pain, diarrhea, brbpr, black stool  Pt notes has had colonoscopy in the past but can't recall results.  Pt notes has had blood transfusion in the past. Pt denies hx of GI bleeding.    In ED,  T 98.1, P 84  R 16, Bp 150/55 pox 97% on RA Wt 115.2 kg  Ultrasound renal -> 09/21/2019 ->  No hydronephrosis  Wbc 12.6, Hgb 7.1, Plt 471 Na 138, K 3.7, Bun 104, Creatinine 3.26 Ast 25, Alt 13  Pt will be admitted for symptomatic anemia    Review of systems:    In addition to the HPI above,  Slight dyspnea No Fever-chills, No Headache, No changes with Vision or hearing, No problems swallowing food or Liquids, No Chest pain, Cough  No Abdominal pain, No Nausea or Vomiting, bowel movements are regular, No Blood in stool or Urine, No dysuria, No new skin rashes or bruises, No new joints pains-aches,  No new weakness, tingling, numbness in any extremity, No recent weight gain or loss, No polyuria, polydypsia or polyphagia, No significant Mental Stressors.  All other systems reviewed and are negative.    Past History of the following :    Past Medical History:  Diagnosis Date  . Anemia   . Arthritis   . Asthma   . Bronchitis   . CHF (congestive heart failure) ([place])   . Chronic kidney disease   . Chronic  kidney disease   . Chronic pain syndrome   . COPD (chronic obstructive pulmonary disease) ([place])   . Cramping of feet   . Diabetes mellitus   . Dyspnea   . GERD (gastroesophageal reflux disease)   . Gout   . Headache   . History of hiatal hernia   . History of hip replacement, total   . History of transfusion   . Hx MRSA infection    abscess left groin  . Hx of cardiac cath 06/2014  . Hyperlipidemia   . Hypertension   . Hypothyroid   . 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 Morbidly obese ([place])   . Peripheral vascular disease ([place])   . Positive cardiac stress test 12/2013   anterior and lateral ischemia on Myoview  . Sleep apnea    UNABLE TO TOLERATE C PAP  . Stress incontinence   . Thyroid disease   .  TIA (transient ischemic attack)     X2 NO RESIDUAL PROBLEMS      Past Surgical History:  Procedure Laterality Date  . ABDOMINAL HYSTERECTOMY    . COLON SURGERY  1995   DUE TO POLYP  . DILATION AND CURETTAGE OF UTERUS    . FOREARM FRACTURE SURGERY     Left arm  . HERNIA REPAIR     w/ mesh  . INCISION AND DRAINAGE ABSCESS Left 02/04/2013   Procedure: INCISION AND DRAINAGE LEFT BUTTOCK ABSCESS; INCISION AND DRAINAGE LEFT BREAST ABSCESS;  Surgeon: Harl Bowie, MD;  Location: [facility];  Service: General;  Laterality: Left;  . INCISION AND DRAINAGE ABSCESS N/A 02/12/2013   Procedure: INCISION AND DEBRIDEMENT BUTTOCK WOUND ;  Surgeon: [name]. Georgette Dover, MD;  Location: [facility];  Service: General;  Laterality: N/A;  . INCISION AND DRAINAGE ABSCESS N/A 02/14/2013   Procedure: INCISION AND DRAINAGE/DRESSING CHANGE;  Surgeon: Harl Bowie, MD;  Location: [facility];  Service: General;  Laterality: N/A;  . INCISION AND DRAINAGE ABSCESS N/A 03/21/2015   Procedure: INCISION AND DRAINAGE PUBIC ABSCESS;  Surgeon: Excell Seltzer, MD;  Location: [facility];  Service: General;  Laterality: N/A;  . INCISION AND DRAINAGE PERIRECTAL ABSCESS Left 02/10/2013   Procedure: IRRIGATION AND DEBRIDEMENT OF  BUTTOCK/PERINEAL ABSCESS;  Surgeon: [name]. Georgette Dover, MD;  Location: [facility];  Service: General;  Laterality: Left;  . INCISION AND DRAINAGE PERIRECTAL ABSCESS N/A 02/16/2013   Procedure: IRRIGATION AND DEBRIDEMENT PERINEAL ABSCESS;  Surgeon: Zenovia Jarred, MD;  Location: [facility];  Service: General;  Laterality: N/A;  . IRRIGATION AND DEBRIDEMENT ABSCESS N/A 02/06/2013   Procedure: IRRIGATION AND DEBRIDEMENT BUTTOCK ABSCESS AND DRESSING CHANGE;  Surgeon: Harl Bowie, MD;  Location: [facility];  Service: General;  Laterality: N/A;  . IRRIGATION AND DEBRIDEMENT ABSCESS Left 02/08/2013   Procedure: IRRIGATION AND DEBRIDEMENT ABSCESS/DRESSING CHANGE;  Surgeon: Gwenyth Ober, MD;  Location: [facility];  Service: General;  Laterality: Left;  . JOINT REPLACEMENT  2010 / 2012   . LAPAROSCOPIC CHOLECYSTECTOMY    . LEFT HEART CATHETERIZATION WITH CORONARY ANGIOGRAM N/A 07/02/2014   Procedure: LEFT HEART CATHETERIZATION WITH CORONARY ANGIOGRAM;  Surgeon: Lorretta Harp, MD;  Location: [facility];  Service: Cardiovascular;  Laterality: N/A;  . LOWER EXTREMITY ANGIOGRAM Right 04/20/2016   Procedure: Lower Extremity Angiogram;  Surgeon: Elam Dutch, MD;  Location: [facility];  Service: Cardiovascular;  Laterality: Right;  . PERIPHERAL VASCULAR CATHETERIZATION N/A 04/20/2016   Procedure: Abdominal Aortogram;  Surgeon: Elam Dutch, MD;  Location: [facility];  Service: Cardiovascular;  Laterality: N/A;  . PERIPHERAL VASCULAR CATHETERIZATION Right 04/20/2016   Procedure: Peripheral Vascular Intervention;  Surgeon: Elam Dutch, MD;  Location: [facility];  Service: Cardiovascular;  Laterality: Right;  popiteal  . RIGHT HEART CATH N/A 10/27/2018   Procedure: RIGHT HEART CATH;  Surgeon: Larey Dresser, MD;  Location: [facility];  Service: Cardiovascular;  Laterality: N/A;  . RIGHT HEART CATH N/A 03/20/2019   Procedure: RIGHT HEART CATH;  Surgeon: Larey Dresser, MD;  Location: [facility];  Service: Cardiovascular;  Laterality: N/A;  . THYROIDECTOMY    . TOTAL HIP REVISION Right 08/11/2014   Procedure: RIGHT ACETABULAR REVISION;  Surgeon: Gearlean Alf, MD;  Location[facility];  Service: Orthopedics;  Laterality: Right;  [facility] VASCULAR SURGERY        Social History:  Social History   Tobacco Use  . Smoking status: Current Some Day Smoker    Packs/day: 0.25    Years: 40.00    Pack years: 10.00    Types: Cigarettes  . Smokeless tobacco: Never Used  Substance Use Topics  . Alcohol use: Yes    Alcohol/week: 1.0 standard drinks    Types: 1 Standard drinks or equivalent per week    Comment: OCC       Family History :     Family History  Problem Relation Age of Onset  . Diabetes Brother   . Cardiomyopathy Mother   . Heart disease Mother   . Cancer Father        Home Medications:   Prior to Admission medications   Medication Sig Start Date End Date Taking? Authorizing Provider  albuterol (PROAIR HFA) 108 (90 Base) MCG/ACT inhaler Inhale 2 puffs into the lungs 4 (four) times daily as needed for wheezing or shortness of breath.    [provider]  albuterol (PROVENTIL) (2.5 MG/3ML) 0.083% nebulizer solution Take 2.5 mg by nebulization every 6 (six) hours as needed for wheezing or shortness of breath.    [provider]  allopurinol (ZYLOPRIM) 100 MG tablet Take 100 mg by mouth every morning.     [provider]  B Complex Vitamins (VITAMIN B COMPLEX PO) Take 1 tablet by mouth 2 (two) times daily.     [provider]  budesonide-formoterol (SYMBICORT) 160-4.5 MCG/ACT inhaler Inhale 2 puffs into the lungs 2 (two) times daily as needed (for SOB).     [provider]  carvedilol (COREG) 6.25 MG tablet Take 9.25 mg by mouth 2 (two) times daily.     [provider]  cetirizine (ZYRTEC) 10 MG tablet Take 10 mg by mouth daily.  03/14/15   [provider]  Cholecalciferol (VITAMIN D) 50 MCG  (2000 UT) CAPS Take 2,000 Units by mouth daily.    [provider]  cinacalcet (SENSIPAR) 30 MG tablet Take 30 mg by mouth daily.    [provider]  clopidogrel (PLAVIX) 75 MG tablet TAKE 1 TABLET BY MOUTH EVERY DAY Patient taking differently: Take 75 mg by mouth daily.  06/18/17   Waynetta Sandy, MD  Dulaglutide 1.5 MG/0.5ML SOPN Inject 1.5 mg into the skin every Monday.     [provider]  fenofibrate (TRICOR) 145 MG tablet TAKE 1 TABLET (145 MG TOTAL) BY MOUTH DAILY. Patient taking differently: Take 145 mg by mouth daily.  05/11/15   Lorretta Harp, MD  fluticasone (FLONASE) 50 MCG/ACT nasal spray Place 2 sprays into the nose as needed for allergies.     [provider]  hydrOXYzine (ATARAX/VISTARIL) 25 MG tablet Take 25 mg by mouth 2 (two) times daily.     [provider]  insulin lispro protamine-lispro (HUMALOG 75/25 MIX) (75-25) 100 UNIT/ML SUSP injection Inject 35-40 Units into the skin See admin instructions. Inject 40 units into the skin morning and 35 units in the evening    [provider]  levothyroxine (SYNTHROID, LEVOTHROID) 150 MCG tablet Take 150 mcg by mouth daily before breakfast.    [provider]  metolazone (ZAROXOLYN) 5 MG tablet Take 5 mg by mouth every Monday, Wednesday, and Friday.  05/22/19   [provider]  mirabegron ER (MYRBETRIQ) 50 MG TB24 tablet Take 50 mg by mouth daily.    [provider]  Multiple Vitamin (MULTIVITAMIN WITH MINERALS) TABS tablet Take 1 tablet by  mouth daily.    [provider]  Multiple Vitamin (MULTIVITAMIN) capsule Take 1 capsule by mouth daily. Use as directed    [provider]  oxyCODONE-acetaminophen (PERCOCET) 10-325 MG tablet Take 1 tablet by mouth every 4 (four) hours as needed for pain. 09/22/19   Eustace Moore, MD  pantoprazole (PROTONIX) 40 MG tablet Take 1 tablet (40 mg total) by mouth daily. 03/22/19   Guilford Shi, MD   polyvinyl alcohol (LIQUIFILM TEARS) 1.4 % ophthalmic solution Place 2 drops into both eyes daily as needed for dry eyes.    [provider]  Potassium Chloride ER 20 MEQ TBCR Take 20 mEq by mouth every Monday, Wednesday, and Friday.  07/15/19   [provider]  simvastatin (ZOCOR) 20 MG tablet Take 20 mg by mouth at bedtime. 04/08/17   [provider]  tiZANidine (ZANAFLEX) 4 MG tablet Take 4 mg by mouth 2 (two) times daily.     [provider]  torsemide (DEMADEX) 20 MG tablet Take 4 tablets (80 mg total) by mouth every morning AND 2 tablets (40 mg total) every evening. Patient taking differently: Take 80 mg by mouth every morning and 40 every evening 10/31/18   Mariel Aloe, MD     Allergies:     Allergies  Allergen Reactions  . Nebivolol Swelling    Chest pain  . Ace Inhibitors Swelling and Other (See Comments)    Tongue swell  . Morphine And Related Itching     Physical Exam:   Vitals  Blood pressure (!) 150/55, pulse 84, temperature 98.1 F (36.7 C), temperature source Oral, resp. rate 16, height 5\' 4"  (1.626 m), weight 115.2 kg, SpO2 97 %.  1.  General: axoxo3  2. Psychiatric: euthymic  3. Neurologic: nonfocal  4. HEENMT:  Anicteric, pale conjuctiva,  pupils 1.75mm symmetric, direct, consensual intact Neck: no jvd  5. Respiratory : CTAB  6. Cardiovascular : rrr s1, s2, no m/g/r  7. Gastrointestinal:  Abd: soft, nt, nd, +bs  8. Skin:  Ext: no c/c/e, no rash  9.Musculoskeletal:  Good ROM    Data Review:    CBC Recent Labs  Lab 10/26/19 1612  WBC 12.6*  HGB 7.1*  HCT 24.1*  PLT 471*  MCV 82.3  MCH 24.2*  MCHC 29.5*  RDW 17.7*  LYMPHSABS 2.1  MONOABS 0.8  EOSABS 0.2  BASOSABS 0.0   ------------------------------------------------------------------------------------------------------------------  Results for orders placed or performed during the hospital encounter of 10/26/19 (from the past 48 hour(s))    CBC with Differential     Status: Abnormal   Collection Time: 10/26/19  4:12 PM  Result Value Ref Range   WBC 12.6 (H) 4.0 - 10.5 K/uL   RBC 2.93 (L) 3.87 - 5.11 MIL/uL   Hemoglobin 7.1 (L) 12.0 - 15.0 g/dL   HCT 24.1 (L) 36.0 - 46.0 %   MCV 82.3 80.0 - 100.0 fL   MCH 24.2 (L) 26.0 - 34.0 pg   MCHC 29.5 (L) 30.0 - 36.0 g/dL   RDW 17.7 (H) 11.5 - 15.5 %   Platelets 471 (H) 150 - 400 K/uL   nRBC 0.0 0.0 - 0.2 %   Neutrophils Relative % 75 %   Neutro Abs 9.4 (H) 1.7 - 7.7 K/uL   Lymphocytes Relative 17 %   Lymphs Abs 2.1 0.7 - 4.0 K/uL   Monocytes Relative 6 %   Monocytes Absolute 0.8 0.1 - 1.0 K/uL   Eosinophils Relative 1 %  Eosinophils Absolute 0.2 0.0 - 0.5 K/uL   Basophils Relative 0 %   Basophils Absolute 0.0 0.0 - 0.1 K/uL   Immature Granulocytes 1 %   Abs Immature Granulocytes 0.09 (H) 0.00 - 0.07 K/uL    Comment: Performed at [facility] [address], [place], [place] [address]  Comprehensive metabolic panel     Status: Abnormal   Collection Time: 10/26/19  4:12 PM  Result Value Ref Range   Sodium 138 135 - 145 mmol/L   Potassium 3.7 3.5 - 5.1 mmol/L   Chloride 93 (L) 98 - 111 mmol/L   CO2 30 22 - 32 mmol/L   Glucose, Bld 178 (H) 70 - 99 mg/dL   BUN 104 (H) 8 - 23 mg/dL   Creatinine, Ser 3.26 (H) 0.44 - 1.00 mg/dL   Calcium 9.0 8.9 - 10.3 mg/dL   Total Protein 6.9 6.5 - 8.1 g/dL   Albumin 3.1 (L) 3.5 - 5.0 g/dL   AST 25 15 - 41 U/L   ALT 13 0 - 44 U/L   Alkaline Phosphatase 64 38 - 126 U/L   Total Bilirubin 0.4 0.3 - 1.2 mg/dL   GFR calc non Af Amer 14 (L) >60 mL/min   GFR calc Af Amer 16 (L) >60 mL/min   Anion gap 15 5 - 15    Comment: Performed at [facility], [place] [address] [place], [place] [address]  Type and screen [place]     Status: None (Preliminary result)   Collection Time: 10/26/19  4:12 PM  Result Value Ref Range   ABO/RH(D) O POS    Antibody Screen NEG    Sample Expiration      10/29/2019,2359 Performed  at [facility], [place] [address] [place],  [address]    Unit Number [id number]    Blood Component Type RED CELLS,LR    Unit division 00    Status of Unit ALLOCATED    Transfusion Status OK TO TRANSFUSE    Crossmatch Result Compatible    Unit Number [id number]    Blood Component Type RED CELLS,LR    Unit division 00    Status of Unit ALLOCATED    Transfusion Status OK TO TRANSFUSE    Crossmatch Result Compatible   POC occult blood, ED     Status: None   Collection Time: 10/26/19  9:47 PM  Result Value Ref Range   Fecal Occult Bld NEGATIVE NEGATIVE  Prepare RBC     Status: None   Collection Time: 10/26/19 10:00 PM  Result Value Ref Range   Order Confirmation      ORDER PROCESSED BY BLOOD BANK Performed at [facility], [place] [address] [place],  [address]     Chemistries  Recent Labs  Lab 10/26/19 1612  NA 138  K 3.7  CL 93*  CO2 30  GLUCOSE 178*  BUN 104*  CREATININE 3.26*  CALCIUM 9.0  AST 25  ALT 13  ALKPHOS 64  BILITOT 0.4   ------------------------------------------------------------------------------------------------------------------  ------------------------------------------------------------------------------------------------------------------ GFR: Estimated Creatinine Clearance: 20.3 mL/min (A) (by C-G formula based on SCr of 3.26 mg/dL (H)). Liver Function Tests: Recent Labs  Lab 10/26/19 1612  AST 25  ALT 13  ALKPHOS 64  BILITOT 0.4  PROT 6.9  ALBUMIN 3.1*   No results for input(s): LIPASE, AMYLASE in the last 168 hours. No results for input(s): AMMONIA in the last 168 hours. Coagulation Profile: No results for input(s):  INR, PROTIME in the last 168 hours. Cardiac Enzymes: No results for input(s): CKTOTAL, CKMB, CKMBINDEX, TROPONINI in the last 168 hours. BNP (last 3 results) No results for input(s): PROBNP in the last 8760 hours. HbA1C: No results for input(s): HGBA1C in the last 72 hours. CBG: No  results for input(s): GLUCAP in the last 168 hours. Lipid Profile: No results for input(s): CHOL, HDL, LDLCALC, TRIG, CHOLHDL, LDLDIRECT in the last 72 hours. Thyroid Function Tests: No results for input(s): TSH, T4TOTAL, FREET4, T3FREE, THYROIDAB in the last 72 hours. Anemia Panel: No results for input(s): VITAMINB12, FOLATE, FERRITIN, TIBC, IRON, RETICCTPCT in the last 72 hours.  --------------------------------------------------------------------------------------------------------------- Urine analysis:    Component Value Date/Time   COLORURINE YELLOW 09/21/2019 1844   APPEARANCEUR CLEAR 09/21/2019 1844   LABSPEC 1.009 09/21/2019 1844   PHURINE 5.0 09/21/2019 1844   GLUCOSEU 50 (A) 09/21/2019 1844   HGBUR NEGATIVE 09/21/2019 1844   BILIRUBINUR NEGATIVE 09/21/2019 1844   KETONESUR NEGATIVE 09/21/2019 1844   PROTEINUR NEGATIVE 09/21/2019 1844   UROBILINOGEN 0.2 08/04/2014 1409   NITRITE NEGATIVE 09/21/2019 1844   LEUKOCYTESUR NEGATIVE 09/21/2019 1844      Imaging Results:    No results found.     Assessment & Plan:    Principal Problem:   Anemia Active Problems:   DM (diabetes mellitus) ([place])   CKD (chronic kidney disease), stage IV ([facility])   HTN (hypertension)   Hypothyroidism   Chronic CHF (congestive heart failure) ([facility])   Anemia Type and screen  Transfusion of 2 units prbc ordered by ED Lasix 40mg  iv x1 Check cbc in am  Chronic systolic CHF (EF 15-40%) Cont Carevedilol 0.25mg  po bid Cont Torsemide Cont Zaroxolyn  Hyperlipidemia Cont Simvastatin 20mg  po qhs Cont Tricor 145mg   (needs renal dosing) => reduce dose to 54mg  po qday due to CKD stage4    Dm2 Cont Dulaglutide Fsbs ac and qhs, ISS  CKD stage 4 Cont Sensipar 30mg  po qday  H/o TIIA/ PVD Cont Plavix 75mg  po qday Cont Simvastatin/ Tricor as above  Hypothyroidism Cont Levothyroxine 150 micrograms po qday  Gerd Cont PPI  Copd/ Asthma Cont Albuterol HFA Cont Symbicort-> Dulera 2puff  bid  H/o Gout Cont Allopurinol 100mg  po qday  Urinary incontinence Cont Myrbetriq 50mg  po qday    DVT Prophylaxis-   - SCDs   AM Labs Ordered, also please review Full Orders  Family Communication: Admission, patients condition and plan of care including tests being ordered have been discussed with the patient  who indicate understanding and agree with the plan and Code Status.  Code Status:  FULL CODE per patient, attempted to notify son that patient admitted to [facility], no response  Admission status: Observation: Based on patients clinical presentation and evaluation of above clinical data, I have made determination that patient meets observation criteria at this time.    Time spent in minutes : 70 minutes   Jani Gravel M.D on 10/26/2019 at 10:43 PM

## 2019-10-26 NOTE — ED Provider Notes (Addendum)
[facility] EMERGENCY DEPARTMENT Provider Note   CSN: [id number] Arrival date & time: 10/26/19  1536     History Chief Complaint  Patient presents with  . Abnormal Lab    [name] [name] is a 69 y.o. female.  69 year old female presents with increased dyspnea on exertion as well as weakness.  Was seen her doctor today and was found to have a hemoglobin of 7.  Patient does have a remote history of colonic polyp which she had several inches of her colon removed.  Denies any current rectal bleeding at this time.  Denies any hematemesis.  No abdominal discomfort.  No other means of blood loss noted.  Was seen here for further evaluation        Past Medical History:  Diagnosis Date  . Anemia   . Arthritis   . Asthma   . Bronchitis   . CHF (congestive heart failure) ([place])   . Chronic kidney disease   . Chronic kidney disease   . Chronic pain syndrome   . COPD (chronic obstructive pulmonary disease) ([place])   . Cramping of feet   . Diabetes mellitus   . Dyspnea   . GERD (gastroesophageal reflux disease)   . Gout   . Headache   . History of hiatal hernia   . History of hip replacement, total   . History of transfusion   . Hx MRSA infection    abscess left groin  . Hx of cardiac cath 06/2014  . Hyperlipidemia   . Hypertension   . Hypothyroid   . 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 Morbidly obese ([place])   . Peripheral vascular disease ([place])   . Positive cardiac stress test 12/2013   anterior and lateral ischemia on Myoview  . Sleep apnea    UNABLE TO TOLERATE C PAP  . Stress incontinence   . Thyroid disease   . TIA (transient ischemic attack)     X2 NO RESIDUAL PROBLEMS    Patient Active Problem List   Diagnosis Date Noted  . S/P lumbar fusion 09/18/2019  . Leukocytosis 03/16/2019  . Chest pain 11/27/2018  . Pre-syncope 11/27/2018  . Epigastric abdominal pain 11/27/2018  . Solitary pulmonary nodule 11/06/2018  . Hypertensive heart and  renal disease, stage 1-4 or unspecified chronic kidney disease, with heart failure ([place]) 10/20/2018  . CHF (congestive heart failure), NYHA class III, acute on chronic, systolic ([place]) 76/19/5093  . 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 Cellulitis of abdominal wall 03/21/2015  . Acute on chronic renal failure ([place]) 03/21/2015  . DM (diabetes mellitus), type 2 with renal complications ([place]) 26/71/2458  . Abdominal wall abscess 03/21/2015  . 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 Low urine output 08/11/2014  . Acute on chronic systolic CHF (congestive heart failure), NYHA class 3 ([place]) 08/11/2014  . Asthma, chronic 08/11/2014  . Hypothyroidism 08/11/2014  . OSA (obstructive sleep apnea) 08/11/2014  . Positive cardiac stress test 06/29/2014  . Hyperlipidemia 02/12/2014  . Left arm weakness 12/23/2013  . TIA (transient ischemic attack) 12/23/2013  . Left buttock abscess 10/12/2013  . Cellulitis and abscess of leg, right 04/16/2013  . Acute blood loss anemia 02/14/2013  . HTN (hypertension) 02/14/2013  . Cellulitis and abscess of buttock 02/09/2013  . Morbid obesity ([place]) 02/03/2013  . Diarrhea 02/03/2013  . Hyponatremia 02/03/2013  . DM (diabetes mellitus) ([place]) 02/02/2013  . CKD (chronic kidney disease), stage  IV ([place]) 02/02/2013  . Cellulitis 02/02/2013  . PAD (peripheral artery disease) ([place]) 04/26/2011  . Tobacco abuse 04/26/2011    Past Surgical History:  Procedure Laterality Date  . ABDOMINAL HYSTERECTOMY    . COLON SURGERY  1995   DUE TO POLYP  . DILATION AND CURETTAGE OF UTERUS    . FOREARM FRACTURE SURGERY     Left arm  . HERNIA REPAIR     w/ mesh  . INCISION AND DRAINAGE ABSCESS Left 02/04/2013   Procedure: INCISION AND DRAINAGE LEFT BUTTOCK ABSCESS; INCISION AND DRAINAGE LEFT BREAST ABSCESS;  Surgeon: Harl Bowie, MD;  Location: [facility];  Service: General;  Laterality: Left;  .  INCISION AND DRAINAGE ABSCESS N/A 02/12/2013   Procedure: INCISION AND DEBRIDEMENT BUTTOCK WOUND ;  Surgeon: [name]. Georgette Dover, MD;  Location: [facility];  Service: General;  Laterality: N/A;  . INCISION AND DRAINAGE ABSCESS N/A 02/14/2013   Procedure: INCISION AND DRAINAGE/DRESSING CHANGE;  Surgeon: Harl Bowie, MD;  Location: [facility];  Service: General;  Laterality: N/A;  . INCISION AND DRAINAGE ABSCESS N/A 03/21/2015   Procedure: INCISION AND DRAINAGE PUBIC ABSCESS;  Surgeon: Excell Seltzer, MD;  Location: [facility];  Service: General;  Laterality: N/A;  . INCISION AND DRAINAGE PERIRECTAL ABSCESS Left 02/10/2013   Procedure: IRRIGATION AND DEBRIDEMENT OF BUTTOCK/PERINEAL ABSCESS;  Surgeon: [name]. Georgette Dover, MD;  Location: [facility];  Service: General;  Laterality: Left;  . INCISION AND DRAINAGE PERIRECTAL ABSCESS N/A 02/16/2013   Procedure: IRRIGATION AND DEBRIDEMENT PERINEAL ABSCESS;  Surgeon: Zenovia Jarred, MD;  Location: [facility];  Service: General;  Laterality: N/A;  . IRRIGATION AND DEBRIDEMENT ABSCESS N/A 02/06/2013   Procedure: IRRIGATION AND DEBRIDEMENT BUTTOCK ABSCESS AND DRESSING CHANGE;  Surgeon: Harl Bowie, MD;  Location: [facility];  Service: General;  Laterality: N/A;  . IRRIGATION AND DEBRIDEMENT ABSCESS Left 02/08/2013   Procedure: IRRIGATION AND DEBRIDEMENT ABSCESS/DRESSING CHANGE;  Surgeon: Gwenyth Ober, MD;  Location: [facility];  Service: General;  Laterality: Left;  . JOINT REPLACEMENT  2010 / 2012   . LAPAROSCOPIC CHOLECYSTECTOMY    . LEFT HEART CATHETERIZATION WITH CORONARY ANGIOGRAM N/A 07/02/2014   Procedure: LEFT HEART CATHETERIZATION WITH CORONARY ANGIOGRAM;  Surgeon: Lorretta Harp, MD;  Location: [facility] C[facility] LAB;  Service: Cardiovascular;  Laterality: N/A;  . LOWER EXTREMITY ANGIOGRAM Right 04/20/2016   Procedure: Lower Extremity Angiogram;  Surgeon: Elam Dutch, MD;  Location: [facility] LAB;  Service: Cardiovascular;  Laterality: Right;  . PERIPHERAL VASCULAR CATHETERIZATION  N/A 04/20/2016   Procedure: Abdominal Aortogram;  Surgeon: Elam Dutch, MD;  Location: [facility];  Service: Cardiovascular;  Laterality: N/A;  . PERIPHERAL VASCULAR CATHETERIZATION Right 04/20/2016   Procedure: Peripheral Vascular Intervention;  Surgeon: Elam Dutch, MD;  Location: [facility] LAB;  Service: Cardiovascular;  Laterality: Right;  popiteal  . RIGHT HEART CATH N/A 10/27/2018   Procedure: RIGHT HEART CATH;  Surgeon: Larey Dresser, MD;  Location: [facility] CV LAB;  Service: Cardiovascular;  Laterality: N/A;  . RIGHT HEART CATH N/A 03/20/2019   Procedure: RIGHT HEART CATH;  Surgeon: Larey Dresser, MD;  Location: [facility];  Service: Cardiovascular;  Laterality: N/A;  . THYROIDECTOMY    . TOTAL HIP REVISION Right 08/11/2014   Procedure: RIGHT ACETABULAR REVISION;  Surgeon: Gearlean Alf, MD;  Location: [facility];  Service: Orthopedics;  Laterality: Right;  [facility] VASCULAR SURGERY       OB History  No obstetric history on file.     Family History  Problem Relation Age of Onset  . Diabetes Brother   . Cardiomyopathy Mother   . Heart disease Mother   . Cancer Father     Social History   Tobacco Use  . Smoking status: Current Some Day Smoker    Packs/day: 0.25    Years: 40.00    Pack years: 10.00    Types: Cigarettes  . Smokeless tobacco: Never Used  Substance Use Topics  . Alcohol use: Yes    Alcohol/week: 1.0 standard drinks    Types: 1 Standard drinks or equivalent per week    Comment: OCC  . Drug use: No    Comment: Quit four years ago.    Home Medications Prior to Admission medications   Medication Sig Start Date End Date Taking? Authorizing Provider  albuterol (PROAIR HFA) 108 (90 Base) MCG/ACT inhaler Inhale 2 puffs into the lungs 4 (four) times daily as needed for wheezing or shortness of breath.    [provider]  albuterol (PROVENTIL) (2.5 MG/3ML) 0.083% nebulizer solution Take 2.5 mg by nebulization every 6 (six)  hours as needed for wheezing or shortness of breath.    [provider]  allopurinol (ZYLOPRIM) 100 MG tablet Take 100 mg by mouth every morning.     [provider]  B Complex Vitamins (VITAMIN B COMPLEX PO) Take 1 tablet by mouth 2 (two) times daily.     [provider]  budesonide-formoterol (SYMBICORT) 160-4.5 MCG/ACT inhaler Inhale 2 puffs into the lungs 2 (two) times daily as needed (for SOB).     [provider]  carvedilol (COREG) 6.25 MG tablet Take 9.25 mg by mouth 2 (two) times daily.     [provider]  cetirizine (ZYRTEC) 10 MG tablet Take 10 mg by mouth daily.  03/14/15   [provider]  Cholecalciferol (VITAMIN D) 50 MCG (2000 UT) CAPS Take 2,000 Units by mouth daily.    [provider]  cinacalcet (SENSIPAR) 30 MG tablet Take 30 mg by mouth daily.    [provider]  clopidogrel (PLAVIX) 75 MG tablet TAKE 1 TABLET BY MOUTH EVERY DAY Patient taking differently: Take 75 mg by mouth daily.  06/18/17   Waynetta Sandy, MD  Dulaglutide 1.5 MG/0.5ML SOPN Inject 1.5 mg into the skin every Monday.     [provider]  fenofibrate (TRICOR) 145 MG tablet TAKE 1 TABLET (145 MG TOTAL) BY MOUTH DAILY. Patient taking differently: Take 145 mg by mouth daily.  05/11/15   Lorretta Harp, MD  fluticasone (FLONASE) 50 MCG/ACT nasal spray Place 2 sprays into the nose as needed for allergies.     [provider]  hydrOXYzine (ATARAX/VISTARIL) 25 MG tablet Take 25 mg by mouth 2 (two) times daily.     [provider]  insulin lispro protamine-lispro (HUMALOG 75/25 MIX) (75-25) 100 UNIT/ML SUSP injection Inject 35-40 Units into the skin See admin instructions. Inject 40 units into the skin morning and 35 units in the evening    [provider]  levothyroxine (SYNTHROID, LEVOTHROID) 150 MCG tablet Take 150 mcg by mouth daily before breakfast.    [provider]  metolazone  (ZAROXOLYN) 5 MG tablet Take 5 mg by mouth every Monday, Wednesday, and Friday.  05/22/19   [provider]  mirabegron ER (MYRBETRIQ) 50 MG TB24 tablet Take 50 mg by mouth daily.    [provider]  Multiple Vitamin (MULTIVITAMIN  WITH MINERALS) TABS tablet Take 1 tablet by mouth daily.    [provider]  Multiple Vitamin (MULTIVITAMIN) capsule Take 1 capsule by mouth daily. Use as directed    [provider]  oxyCODONE-acetaminophen (PERCOCET) 10-325 MG tablet Take 1 tablet by mouth every 4 (four) hours as needed for pain. 09/22/19   Eustace Moore, MD  pantoprazole (PROTONIX) 40 MG tablet Take 1 tablet (40 mg total) by mouth daily. 03/22/19   Guilford Shi, MD  polyvinyl alcohol (LIQUIFILM TEARS) 1.4 % ophthalmic solution Place 2 drops into both eyes daily as needed for dry eyes.    [provider]  Potassium Chloride ER 20 MEQ TBCR Take 20 mEq by mouth every Monday, Wednesday, and Friday.  07/15/19   [provider]  simvastatin (ZOCOR) 20 MG tablet Take 20 mg by mouth at bedtime. 04/08/17   [provider]  tiZANidine (ZANAFLEX) 4 MG tablet Take 4 mg by mouth 2 (two) times daily.     [provider]  torsemide (DEMADEX) 20 MG tablet Take 4 tablets (80 mg total) by mouth every morning AND 2 tablets (40 mg total) every evening. Patient taking differently: Take 80 mg by mouth every morning and 40 every evening 10/31/18   Mariel Aloe, MD    Allergies    Nebivolol, Ace inhibitors, and Morphine and related  Review of Systems   Review of Systems  All other systems reviewed and are negative.   Physical Exam Updated Vital Signs BP (!) 150/55 (BP Location: Right Arm)   Pulse 84   Temp 98.1 F (36.7 C) (Oral)   Resp 16   Ht 1.626 m (5\' 4" )   Wt 115.2 kg   SpO2 97%   BMI 43.60 kg/m   Physical Exam Vitals and nursing note reviewed.  Constitutional:      General: She is not in acute distress.    Appearance: Normal  appearance. She is well-developed. She is not toxic-appearing.  HENT:     Head: Normocephalic and atraumatic.  Eyes:     General: Lids are normal.     Conjunctiva/sclera: Conjunctivae normal.     Pupils: Pupils are equal, round, and reactive to light.  Neck:     Thyroid: No thyroid mass.     Trachea: No tracheal deviation.  Cardiovascular:     Rate and Rhythm: Normal rate and regular rhythm.     Heart sounds: Normal heart sounds. No murmur. No gallop.   Pulmonary:     Effort: Pulmonary effort is normal. No respiratory distress.     Breath sounds: Normal breath sounds. No stridor. No decreased breath sounds, wheezing, rhonchi or rales.  Abdominal:     General: Bowel sounds are normal. There is no distension.     Palpations: Abdomen is soft.     Tenderness: There is no abdominal tenderness. There is no rebound.  Genitourinary:    Rectum: No tenderness.  Musculoskeletal:        General: No tenderness. Normal range of motion.     Cervical back: Normal range of motion and neck supple.  Skin:    General: Skin is warm and dry.     Findings: No abrasion or rash.  Neurological:     Mental Status: She is alert and oriented to person, place, and time.     GCS: GCS eye subscore is 4. GCS verbal subscore is 5. GCS motor subscore is 6.     Cranial Nerves: No cranial nerve deficit.  Sensory: No sensory deficit.  Psychiatric:        Speech: Speech normal.        Behavior: Behavior normal.     ED Results / Procedures / Treatments   Labs (all labs ordered are listed, but only abnormal results are displayed) Labs Reviewed  CBC WITH DIFFERENTIAL/PLATELET - Abnormal; Notable for the following components:      Result Value   WBC 12.6 (*)    RBC 2.93 (*)    Hemoglobin 7.1 (*)    HCT 24.1 (*)    MCH 24.2 (*)    MCHC 29.5 (*)    RDW 17.7 (*)    Platelets 471 (*)    Neutro Abs 9.4 (*)    Abs Immature Granulocytes 0.09 (*)    All other components within normal limits  COMPREHENSIVE  METABOLIC PANEL - Abnormal; Notable for the following components:   Chloride 93 (*)    Glucose, Bld 178 (*)    BUN 104 (*)    Creatinine, Ser 3.26 (*)    Albumin 3.1 (*)    GFR calc non Af Amer 14 (*)    GFR calc Af Amer 16 (*)    All other components within normal limits  OCCULT BLOOD X 1 CARD TO LAB, STOOL  TYPE AND SCREEN  PREPARE RBC (CROSSMATCH)    EKG None  Radiology No results found.  Procedures Procedures (including critical care time)  Medications Ordered in ED Medications  0.9 %  sodium chloride infusion (has no administration in time range)    ED Course  I have reviewed the triage vital signs and the nursing notes.  Pertinent labs & imaging results that were available during my care of the patient were reviewed by me and considered in my medical decision making (see chart for details).    MDM Rules/Calculators/A&P                      Patient is guaiac negative here.  Has evidence of low hemoglobin.  Also has chronic kidney disease as well as history of CHF.  Will order transfusion of packed red blood cells and consult hospitalist for admission  CRITICAL CARE Performed by: Leota Jacobsen Total critical care time: 45 minutes Critical care time was exclusive of separately billable procedures and treating other patients. Critical care was necessary to treat or prevent imminent or life-threatening deterioration. Critical care was time spent personally by me on the following activities: development of treatment plan with patient and/or surrogate as well as nursing, discussions with consultants, evaluation of patient's response to treatment, examination of patient, obtaining history from patient or surrogate, ordering and performing treatments and interventions, ordering and review of laboratory studies, ordering and review of radiographic studies, pulse oximetry and re-evaluation of patient's condition.  Final Clinical Impression(s) / ED Diagnoses Final  diagnoses:  None    Rx / DC Orders ED Discharge Orders    None       Lacretia Leigh, MD 10/26/19 2146    Lacretia Leigh, MD 11/09/19 1246

## 2019-10-26 NOTE — ED Notes (Signed)
Assisted MD with hemocult

## 2019-10-27 DIAGNOSIS — D649 Anemia, unspecified: Secondary | ICD-10-CM | POA: Diagnosis not present

## 2019-10-27 DIAGNOSIS — D631 Anemia in chronic kidney disease: Secondary | ICD-10-CM | POA: Diagnosis not present

## 2019-10-27 DIAGNOSIS — N184 Chronic kidney disease, stage 4 (severe): Secondary | ICD-10-CM | POA: Diagnosis not present

## 2019-10-27 LAB — COMPREHENSIVE METABOLIC PANEL
ALT: 14 U/L (ref 0–44)
AST: 23 U/L (ref 15–41)
Albumin: 3 g/dL — ABNORMAL LOW (ref 3.5–5.0)
Alkaline Phosphatase: 59 U/L (ref 38–126)
Anion gap: 14 (ref 5–15)
BUN: 105 mg/dL — ABNORMAL HIGH (ref 8–23)
CO2: 30 mmol/L (ref 22–32)
Calcium: 8.9 mg/dL (ref 8.9–10.3)
Chloride: 95 mmol/L — ABNORMAL LOW (ref 98–111)
Creatinine, Ser: 3.13 mg/dL — ABNORMAL HIGH (ref 0.44–1.00)
GFR calc Af Amer: 17 mL/min — ABNORMAL LOW (ref >60)
GFR calc non Af Amer: 14 mL/min — ABNORMAL LOW (ref >60)
Glucose, Bld: 156 mg/dL — ABNORMAL HIGH (ref 70–99)
Potassium: 3.4 mmol/L — ABNORMAL LOW (ref 3.5–5.1)
Sodium: 139 mmol/L (ref 135–145)
Total Bilirubin: 0.6 mg/dL (ref 0.3–1.2)
Total Protein: 6.3 g/dL — ABNORMAL LOW (ref 6.5–8.1)

## 2019-10-27 LAB — HIV ANTIBODY (ROUTINE TESTING W REFLEX): HIV Screen 4th Generation wRfx: NONREACTIVE

## 2019-10-27 LAB — CBC
HCT: 28.5 % — ABNORMAL LOW (ref 36.0–46.0)
Hemoglobin: 8.7 g/dL — ABNORMAL LOW (ref 12.0–15.0)
MCH: 25.5 pg — ABNORMAL LOW (ref 26.0–34.0)
MCHC: 30.5 g/dL (ref 30.0–36.0)
MCV: 83.6 fL (ref 80.0–100.0)
Platelets: 435 10*3/uL — ABNORMAL HIGH (ref 150–400)
RBC: 3.41 MIL/uL — ABNORMAL LOW (ref 3.87–5.11)
RDW: 17.8 % — ABNORMAL HIGH (ref 11.5–15.5)
WBC: 11.8 10*3/uL — ABNORMAL HIGH (ref 4.0–10.5)
nRBC: 0.2 % (ref 0.0–0.2)

## 2019-10-27 LAB — SARS CORONAVIRUS 2 (TAT 6-24 HRS): SARS Coronavirus 2: NEGATIVE

## 2019-10-27 LAB — CBG MONITORING, ED
Glucose-Capillary: 203 mg/dL — ABNORMAL HIGH (ref 70–99)
Glucose-Capillary: 139 mg/dL — ABNORMAL HIGH (ref 70–99)
Glucose-Capillary: 197 mg/dL — ABNORMAL HIGH (ref 70–99)

## 2019-10-27 LAB — MAGNESIUM: Magnesium: 1.8 mg/dL (ref 1.7–2.4)

## 2019-10-27 MED ORDER — POLYVINYL ALCOHOL 1.4 % OP SOLN
2.0000 [drp] | Freq: Every day | OPHTHALMIC | Status: DC | PRN
  Filled 2019-10-27: qty 15

## 2019-10-27 MED ORDER — LEVOTHYROXINE SODIUM 75 MCG PO TABS
150.0000 ug | ORAL_TABLET | Freq: Every day | ORAL | Status: DC
  Administered 2019-10-27: 150 ug via ORAL
  Filled 2019-10-27: qty 2

## 2019-10-27 MED ORDER — FERROUS SULFATE 325 (65 FE) MG PO TABS: 325.0000 mg | ORAL_TABLET | Freq: Every day | ORAL | 0 refills | Status: DC

## 2019-10-27 MED ORDER — DULAGLUTIDE 1.5 MG/0.5ML ~~LOC~~ SOAJ: 1.5000 mg | SUBCUTANEOUS | Status: DC

## 2019-10-27 MED ORDER — CINACALCET HCL 30 MG PO TABS
30.0000 mg | ORAL_TABLET | Freq: Every day | ORAL | Status: DC
  Filled 2019-10-27: qty 1

## 2019-10-27 MED ORDER — PANTOPRAZOLE SODIUM 40 MG PO TBEC
40.0000 mg | DELAYED_RELEASE_TABLET | Freq: Every day | ORAL | Status: DC
  Administered 2019-10-27: 40 mg via ORAL
  Filled 2019-10-27 (×2): qty 1

## 2019-10-27 MED ORDER — HYDROXYZINE HCL 25 MG PO TABS
25.0000 mg | ORAL_TABLET | Freq: Two times a day (BID) | ORAL | Status: DC
  Administered 2019-10-27: 25 mg via ORAL
  Filled 2019-10-27 (×2): qty 1

## 2019-10-27 MED ORDER — GERITOL TONIC PO LIQD: Freq: Every day | ORAL | Status: DC

## 2019-10-27 MED ORDER — VITAMIN B COMPLEX PO TABS: ORAL_TABLET | Freq: Two times a day (BID) | ORAL | Status: DC

## 2019-10-27 MED ORDER — B COMPLEX-C PO TABS
1.0000 | ORAL_TABLET | Freq: Every day | ORAL | Status: DC
  Filled 2019-10-27: qty 1

## 2019-10-27 MED ORDER — VITAMIN D 25 MCG (1000 UNIT) PO TABS
2000.0000 [IU] | ORAL_TABLET | Freq: Every day | ORAL | Status: DC
  Administered 2019-10-27: 10:00:00 2000 [IU] via ORAL
  Filled 2019-10-27: qty 2

## 2019-10-27 MED ORDER — FLUTICASONE PROPIONATE 50 MCG/ACT NA SUSP: 2.0000 | Freq: Every day | NASAL | Status: DC

## 2019-10-27 MED ORDER — MOMETASONE FURO-FORMOTEROL FUM 200-5 MCG/ACT IN AERO: 2.0000 | INHALATION_SPRAY | Freq: Two times a day (BID) | RESPIRATORY_TRACT | Status: DC

## 2019-10-27 MED ORDER — METOLAZONE 5 MG PO TABS: 5.0000 mg | ORAL_TABLET | ORAL | Status: DC

## 2019-10-27 MED ORDER — DARBEPOETIN ALFA 60 MCG/0.3ML IJ SOSY
60.0000 ug | PREFILLED_SYRINGE | Freq: Once | INTRAMUSCULAR | Status: AC
  Administered 2019-10-27: 14:00:00 60 ug via SUBCUTANEOUS
  Filled 2019-10-27: qty 0.3

## 2019-10-27 MED ORDER — ADULT MULTIVITAMIN W/MINERALS CH
1.0000 | ORAL_TABLET | Freq: Every day | ORAL | Status: DC
  Administered 2019-10-27: 10:00:00 1 via ORAL
  Filled 2019-10-27: qty 1

## 2019-10-27 MED ORDER — OXYCODONE-ACETAMINOPHEN 10-325 MG PO TABS: 1.0000 | ORAL_TABLET | ORAL | Status: DC | PRN

## 2019-10-27 MED ORDER — FENOFIBRATE 54 MG PO TABS
54.0000 mg | ORAL_TABLET | Freq: Every day | ORAL | Status: DC
  Filled 2019-10-27: qty 1

## 2019-10-27 MED ORDER — CARVEDILOL 12.5 MG PO TABS
12.5000 mg | ORAL_TABLET | Freq: Two times a day (BID) | ORAL | Status: DC
  Administered 2019-10-27: 10:00:00 12.5 mg via ORAL
  Filled 2019-10-27: qty 1

## 2019-10-27 MED ORDER — ALBUTEROL SULFATE HFA 108 (90 BASE) MCG/ACT IN AERS: 2.0000 | INHALATION_SPRAY | Freq: Four times a day (QID) | RESPIRATORY_TRACT | Status: DC | PRN

## 2019-10-27 MED ORDER — TORSEMIDE 20 MG PO TABS: 40.0000 mg | ORAL_TABLET | Freq: Every evening | ORAL | Status: DC

## 2019-10-27 MED ORDER — OXYCODONE HCL 5 MG PO TABS: 5.0000 mg | ORAL_TABLET | ORAL | Status: DC | PRN

## 2019-10-27 MED ORDER — OXYCODONE-ACETAMINOPHEN 5-325 MG PO TABS
1.0000 | ORAL_TABLET | ORAL | Status: DC | PRN
  Administered 2019-10-27: 1 via ORAL
  Filled 2019-10-27: qty 1

## 2019-10-27 MED ORDER — TIZANIDINE HCL 4 MG PO TABS
4.0000 mg | ORAL_TABLET | Freq: Two times a day (BID) | ORAL | Status: DC
  Administered 2019-10-27: 10:00:00 4 mg via ORAL
  Filled 2019-10-27: qty 1

## 2019-10-27 MED ORDER — ALLOPURINOL 100 MG PO TABS
100.0000 mg | ORAL_TABLET | ORAL | Status: DC
  Administered 2019-10-27: 08:00:00 100 mg via ORAL
  Filled 2019-10-27: qty 1

## 2019-10-27 MED ORDER — MIRABEGRON ER 50 MG PO TB24
50.0000 mg | ORAL_TABLET | Freq: Every day | ORAL | Status: DC
  Filled 2019-10-27: qty 1

## 2019-10-27 MED ORDER — FERROUS SULFATE 325 (65 FE) MG PO TABS
325.0000 mg | ORAL_TABLET | Freq: Every day | ORAL | Status: DC
  Administered 2019-10-27: 325 mg via ORAL
  Filled 2019-10-27: qty 1

## 2019-10-27 MED ORDER — SIMVASTATIN 20 MG PO TABS: 20.0000 mg | ORAL_TABLET | Freq: Every day | ORAL | Status: DC

## 2019-10-27 MED ORDER — TORSEMIDE 20 MG PO TABS
80.0000 mg | ORAL_TABLET | Freq: Every day | ORAL | Status: DC
  Administered 2019-10-27: 10:00:00 80 mg via ORAL
  Filled 2019-10-27: qty 4

## 2019-10-27 MED ORDER — POTASSIUM CHLORIDE CRYS ER 20 MEQ PO TBCR: 40.0000 meq | EXTENDED_RELEASE_TABLET | Freq: Once | ORAL | Status: DC

## 2019-10-27 MED ORDER — LORATADINE 10 MG PO TABS
10.0000 mg | ORAL_TABLET | Freq: Every day | ORAL | Status: DC
  Administered 2019-10-27: 10:00:00 10 mg via ORAL
  Filled 2019-10-27 (×2): qty 1

## 2019-10-27 MED ORDER — POTASSIUM CHLORIDE CRYS ER 20 MEQ PO TBCR: 20.0000 meq | EXTENDED_RELEASE_TABLET | ORAL | Status: DC

## 2019-10-27 NOTE — ED Notes (Signed)
Pt resting in bed. No distress noted. Will continue to monitor.  

## 2019-10-27 NOTE — ED Notes (Signed)
Pt verbalized understanding of discharge instructions. Follow up care and prescriptions reviewed. Pt brought to lobby to wait for son to pick her up.

## 2019-10-27 NOTE — ED Notes (Addendum)
Pt ambulated to bathroom with walker.

## 2019-10-27 NOTE — ED Notes (Signed)
Medications given and charted per [facility]. Tolerated well. Blood transfusing per MAR. No distress noted.

## 2019-10-27 NOTE — ED Notes (Signed)
Pt resting in bed. Blood transfusion complete. No distress noted. Will continue to monitor. Pt denies new or worsening complaints.

## 2019-10-27 NOTE — ED Notes (Signed)
Lunch Tray Ordered @ 1059.  

## 2019-10-27 NOTE — Discharge Summary (Signed)
Physician Discharge Summary  MAKELA NIEHOFF HWE:[id number] DOB: 01-30-1951 DOA: 10/26/2019  PCP: [name], MD  Admit date: 10/26/2019 Discharge date: 10/27/2019  Time spent: 45 minutes  Recommendations for Outpatient Follow-up:  1. Follow-up with PCP in 1 week for evaluation of symptoms.  Also recommend CBC to track hemoglobin.  Also recommend basic metabolic panel to check potassium level 2. Follow-up with PCP in 1 month.  Recommend evaluation of iron stores    Discharge Diagnoses:  Principal Problem:   Anemia Active Problems:   DM (diabetes mellitus) ([facility])   CKD (chronic kidney disease), stage IV ([facility])   HTN (hypertension)   Hypothyroidism   Chronic CHF (congestive heart failure) ([facility])   Discharge Condition: Stable  Diet recommendation: Carb modified/heart healthy  Filed Weights   10/26/19 1604  Weight: 115.2 kg    History of present illness:   [name] [name]  is a 69 y.o. female,  w  Hypothyroidism, Gout hypertension, hyperlipidemia, Dm2,  CKD stage 4, CHF(EF 40-45%)  H/o TIA, Asthma/ Copd, pulmonary nodule left lower lung , OSA, w recent back surgery 09/18/19, presented 1/18 with c/o anemia, sent by her PCP to ED for evaluation.  Pt denied n/v, abd pain, diarrhea, brbpr, Antwan Bribiesca stool  Pt noted  had colonoscopy in the past but can't recall results.  She also states that at that time Dr. Rockey Situ her "I would never have to have another colonoscopy". Pt noted has had blood transfusion in the past. Pt denied hx of GI bleeding.  Medications do include Plavix.  Denied NSAID use  Hospital Course:  #1.  Acute on chronic anemia.  Patient with a history of anemia due to chronic kidney disease.  Last month she had back surgery at which time her hemoglobin was 10.  At presentation to the emergency department hemoglobin 7.1.  Home medications do include Plavix.  She denies NSAID use.  Denies hematochezia or melena.  Anemia panel not obtained prior to transfusion.  FOBT negative.  She  received 2 units of packed red blood cells.  At discharge hemoglobin 8.7.  We will also discharge her with iron supplement daily for 1 month.  Recommend iron stores be checked in 30 days.  Have also instructed her to follow-up with her primary care provider in 1 week to track her hemoglobin.  #2.  Chronic systolic heart failure.  Compensated.  Recent echo reveals an EF of 40%.  Home medications include beta-blocker, torsemide and Zaroxolyn.  She will continue these at discharge.  #3.  Diabetes type 2.  Hemoglobin A1c 7.32-month ago.  She will continue her home regimen at discharge.  Follow-up with primary care provider for optimal diabetes control  #4.  Chronic kidney disease stage IV.  Stable at baseline.  Home medications include Sensipar  #5.  History of TIA/PVD.  Home medications include Plavix, simvastatin, TriCor.  Continue home medications at discharge  #6.  Hyperlipidemia.  Continue home meds   Procedures:  Transfused 2 units  Consultations:   Discharge Exam: Vitals:   10/27/19 0600 10/27/19 0730  BP: (!) 118/54 129/77  Pulse: 83 81  Resp: 15   Temp:    SpO2: 90% 95%    General: obese awake no acute distress Cardiovascular: rrr no mgr no LE edema Respiratory: normal effort BS clear bilaterally no wheeze  Discharge Instructions   Discharge Instructions    Call MD for:  difficulty breathing, headache or visual disturbances   Complete by: As directed    Call MD for:  persistant dizziness or light-headedness   Complete by: As directed    Call MD for:  temperature >100.4   Complete by: As directed    Diet - low sodium heart healthy   Complete by: As directed    Discharge instructions   Complete by: As directed    Take medications as prescribed Follow up with PCP in 1 week for lab work. Need to check CBC at that time to track Hg. Also recommend BMET to track potassium level Recommend lab work in 1 month to check iron stores   Increase activity slowly   Complete  by: As directed      Allergies as of 10/27/2019      Reactions   Nebivolol Swelling   Chest pain   Ace Inhibitors Swelling, Other (See Comments)   Tongue swell   Morphine And Related Itching      Medication List    TAKE these medications   albuterol (2.5 MG/3ML) 0.083% nebulizer solution Commonly known as: PROVENTIL Take 2.5 mg by nebulization every 6 (six) hours as needed for wheezing or shortness of breath.   ProAir HFA 108 (90 Base) MCG/ACT inhaler Generic drug: albuterol Inhale 2 puffs into the lungs 4 (four) times daily as needed for wheezing or shortness of breath.   allopurinol 100 MG tablet Commonly known as: ZYLOPRIM Take 100 mg by mouth every morning.   budesonide-formoterol 160-4.5 MCG/ACT inhaler Commonly known as: SYMBICORT Inhale 2 puffs into the lungs 2 (two) times daily as needed (for SOB).   carvedilol 6.25 MG tablet Commonly known as: COREG Take 12.5 mg by mouth 2 (two) times daily.   cetirizine 10 MG tablet Commonly known as: ZYRTEC Take 10 mg by mouth daily.   cinacalcet 30 MG tablet Commonly known as: SENSIPAR Take 30 mg by mouth daily.   clopidogrel 75 MG tablet Commonly known as: PLAVIX TAKE 1 TABLET BY MOUTH EVERY DAY   Dulaglutide 1.5 MG/0.5ML Sopn Inject 1.5 mg into the skin every Monday.   fenofibrate 145 MG tablet Commonly known as: TRICOR TAKE 1 TABLET (145 MG TOTAL) BY MOUTH DAILY. What changed: See the new instructions.   ferrous sulfate 325 (65 FE) MG tablet Take 1 tablet (325 mg total) by mouth daily with breakfast.   fluticasone 50 MCG/ACT nasal spray Commonly known as: FLONASE Place 2 sprays into the nose as needed for allergies.   GERITOL TONIC PO Take 15 mLs by mouth daily.   hydrOXYzine 25 MG tablet Commonly known as: ATARAX/VISTARIL Take 25 mg by mouth 2 (two) times daily.   insulin lispro protamine-lispro (75-25) 100 UNIT/ML Susp injection Commonly known as: HUMALOG 75/25 MIX Inject 35-40 Units into the  skin See admin instructions. Inject 40 units into the skin morning and 35 units in the evening   levothyroxine 150 MCG tablet Commonly known as: SYNTHROID Take 150 mcg by mouth daily before breakfast.   metolazone 5 MG tablet Commonly known as: ZAROXOLYN Take 5 mg by mouth every Monday, Wednesday, and Friday.   multivitamin with minerals Tabs tablet Take 1 tablet by mouth daily.   Myrbetriq 50 MG Tb24 tablet Generic drug: mirabegron ER Take 50 mg by mouth daily.   oxyCODONE-acetaminophen 10-325 MG tablet Commonly known as: PERCOCET Take 1 tablet by mouth every 4 (four) hours as needed for pain.   pantoprazole 40 MG tablet Commonly known as: PROTONIX Take 1 tablet (40 mg total) by mouth daily.   polyvinyl alcohol 1.4 % ophthalmic solution Commonly known as: LIQUIFILM  TEARS Place 2 drops into both eyes daily as needed for dry eyes.   Potassium Chloride ER 20 MEQ Tbcr Take 20 mEq by mouth every Monday, Wednesday, and Friday.   simvastatin 20 MG tablet Commonly known as: ZOCOR Take 20 mg by mouth at bedtime.   tiZANidine 4 MG tablet Commonly known as: ZANAFLEX Take 4 mg by mouth 2 (two) times daily.   torsemide 20 MG tablet Commonly known as: DEMADEX Take 4 tablets (80 mg total) by mouth every morning AND 2 tablets (40 mg total) every evening. What changed: See the new instructions.   VITAMIN B COMPLEX PO Take 1 tablet by mouth 2 (two) times daily.   Vitamin D 50 MCG (2000 UT) Caps Take 2,000 Units by mouth daily.      Allergies  Allergen Reactions  . Nebivolol Swelling    Chest pain  . Ace Inhibitors Swelling and Other (See Comments)    Tongue swell  . Morphine And Related Itching      The results of significant diagnostics from this hospitalization (including imaging, microbiology, ancillary and laboratory) are listed below for reference.    Significant Diagnostic Studies: No results found.  Microbiology: Recent Results (from the past 240 hour(s))   SARS CORONAVIRUS 2 (TAT 6-24 HRS) Nasopharyngeal Nasopharyngeal Swab     Status: None   Collection Time: 10/27/19 12:29 AM   Specimen: Nasopharyngeal Swab  Result Value Ref Range Status   SARS Coronavirus 2 NEGATIVE NEGATIVE Final    Comment: (NOTE) SARS-CoV-2 target nucleic acids are NOT DETECTED. The SARS-CoV-2 RNA is generally detectable in upper and lower respiratory specimens during the acute phase of infection. Negative results do not preclude SARS-CoV-2 infection, do not rule out co-infections with other pathogens, and should not be used as the sole basis for treatment or other patient management decisions. Negative results must be combined with clinical observations, patient history, and epidemiological information. The expected result is Negative. Fact Sheet for Patients: SugarRoll.be Fact Sheet for Healthcare Providers: https://www.woods-mathews.com/ This test is not yet approved or cleared by the [place] FDA and  has been authorized for detection and/or diagnosis of SARS-CoV-2 by FDA under an Emergency Use Authorization (EUA). This EUA will remain  in effect (meaning this test can be used) for the duration of the COVID-19 declaration under Section 56 4(b)(1) of the Act, 21 U.S.C. section 360bbb-3(b)(1), unless the authorization is terminated or revoked sooner. Performed at [facility], [place] [address], [place], [place] [address]      Labs: Basic Metabolic Panel: Recent Labs  Lab 10/26/19 1612 10/27/19 0520 10/27/19 0805  NA 138 139  --   K 3.7 3.4*  --   CL 93* 95*  --   CO2 30 30  --   GLUCOSE 178* 156*  --   BUN 104* 105*  --   CREATININE 3.26* 3.13*  --   CALCIUM 9.0 8.9  --   MG  --   --  1.8   Liver Function Tests: Recent Labs  Lab 10/26/19 1612 10/27/19 0520  AST 25 23  ALT 13 14  ALKPHOS 64 59  BILITOT 0.4 0.6  PROT 6.9 6.3*  ALBUMIN 3.1* 3.0*   No results for input(s): LIPASE, AMYLASE  in the last 168 hours. No results for input(s): AMMONIA in the last 168 hours. CBC: Recent Labs  Lab 10/26/19 1612 10/27/19 0520  WBC 12.6* 11.8*  NEUTROABS 9.4*  --   HGB 7.1* 8.7*  HCT 24.1* 28.5*  MCV 82.3 83.6  PLT 471* 435*   Cardiac Enzymes: No results for input(s): CKTOTAL, CKMB, CKMBINDEX, TROPONINI in the last 168 hours. BNP: BNP (last 3 results) Recent Labs    11/14/18 0936 03/16/19 1341 04/08/19 1245  BNP 206.8* 196.4* 294.4*    ProBNP (last 3 results) No results for input(s): PROBNP in the last 8760 hours.  CBG: Recent Labs  Lab 10/27/19 0140 10/27/19 0729  GLUCAP 203* 139*       Signed:  Radene Gunning NP.  [facility] 10/27/2019, 11:24 AM

## 2019-10-27 NOTE — ED Notes (Signed)
Blood sent to lab

## 2019-10-27 NOTE — ED Notes (Signed)
Pt conscious, breathing, and A&Ox4. No distress noted. Blood infusing per MAR. No distress noted.

## 2019-10-27 NOTE — ED Notes (Signed)
Medications given and charted per MAR. Tolerated well. No distress noted. 

## 2019-10-28 LAB — TYPE AND SCREEN
ABO/RH(D): O POS
Antibody Screen: NEGATIVE
Unit division: 0
Unit division: 0

## 2019-10-28 LAB — BPAM RBC
Blood Product Expiration Date: 202102162359
Blood Product Expiration Date: 202102162359
ISSUE DATE / TIME: 202101182344
ISSUE DATE / TIME: 202101190155
Unit Type and Rh: 5100
Unit Type and Rh: 5100

## 2019-10-29 ENCOUNTER — Other Ambulatory Visit (HOSPITAL_COMMUNITY): Payer: Self-pay

## 2019-10-29 NOTE — Discharge Instructions (Signed)
  Epoetin Alfa injection What is this medicine? EPOETIN ALFA (e POE e tin AL fa) helps your body make more red blood cells. This medicine is used to treat anemia caused by chronic kidney disease, cancer chemotherapy, or HIV-therapy. It may also be used before surgery if you have anemia. This medicine may be used for other purposes; ask your health care provider or pharmacist if you have questions. COMMON BRAND NAME(S): Epogen, Procrit, Retacrit What should I tell my health care provider before I take this medicine? They need to know if you have any of these conditions:  cancer  heart disease  high blood pressure  history of blood clots  history of stroke  low levels of folate, iron, or vitamin B12 in the blood  seizures  an unusual or allergic reaction to erythropoietin, albumin, benzyl alcohol, hamster proteins, other medicines, foods, dyes, or preservatives  pregnant or trying to get pregnant  breast-feeding How should I use this medicine? This medicine is for injection into a vein or under the skin. It is usually given by a health care professional in a hospital or clinic setting. If you get this medicine at home, you will be taught how to prepare and give this medicine. Use exactly as directed. Take your medicine at regular intervals. Do not take your medicine more often than directed. It is important that you put your used needles and syringes in a special sharps container. Do not put them in a trash can. If you do not have a sharps container, call your pharmacist or healthcare provider to get one. A special MedGuide will be given to you by the pharmacist with each prescription and refill. Be sure to read this information carefully each time. Talk to your pediatrician regarding the use of this medicine in children. While this drug may be prescribed for selected conditions, precautions do apply. Overdosage: If you think you have taken too much of this medicine contact a  poison control center or emergency room at once. NOTE: This medicine is only for you. Do not share this medicine with others. What if I miss a dose? If you miss a dose, take it as soon as you can. If it is almost time for your next dose, take only that dose. Do not take double or extra doses. What may interact with this medicine? Interactions have not been studied. This list may not describe all possible interactions. Give your health care provider a list of all the medicines, herbs, non-prescription drugs, or dietary supplements you use. Also tell them if you smoke, drink alcohol, or use illegal drugs. Some items may interact with your medicine. What should I watch for while using this medicine? Your condition will be monitored carefully while you are receiving this medicine. You may need blood work done while you are taking this medicine. This medicine may cause a decrease in vitamin B6. You should make sure that you get enough vitamin B6 while you are taking this medicine. Discuss the foods you eat and the vitamins you take with your health care professional. What side effects may I notice from receiving this medicine? Side effects that you should report to your doctor or health care professional as soon as possible:  allergic reactions like skin rash, itching or hives, swelling of the face, lips, or tongue  seizures  signs and symptoms of a blood clot such as breathing problems; changes in vision; chest pain; severe, sudden headache; pain, swelling, warmth in the leg; trouble speaking; sudden   numbness or weakness of the face, arm or leg  signs and symptoms of a stroke like changes in vision; confusion; trouble speaking or understanding; severe headaches; sudden numbness or weakness of the face, arm or leg; trouble walking; dizziness; loss of balance or coordination Side effects that usually do not require medical attention (report to your doctor or health care professional if they continue  or are bothersome):  chills  cough  dizziness  fever  headaches  joint pain  muscle cramps  muscle pain  nausea, vomiting  pain, redness, or irritation at site where injected This list may not describe all possible side effects. Call your doctor for medical advice about side effects. You may report side effects to FDA at 1-800-FDA-1088. Where should I keep my medicine? Keep out of the reach of children. Store in a refrigerator between 2 and 8 degrees C (36 and 46 degrees F). Do not freeze or shake. Throw away any unused portion if using a single-dose vial. Multi-dose vials can be kept in the refrigerator for up to 21 days after the initial dose. Throw away unused medicine. NOTE: This sheet is a summary. It may not cover all possible information. If you have questions about this medicine, talk to your doctor, pharmacist, or health care provider.  2020 Elsevier/Gold Standard (2017-05-03 08:35:19) Ferumoxytol injection What is this medicine? FERUMOXYTOL is an iron complex. Iron is used to make healthy red blood cells, which carry oxygen and nutrients throughout the body. This medicine is used to treat iron deficiency anemia. This medicine may be used for other purposes; ask your health care provider or pharmacist if you have questions. COMMON BRAND NAME(S): Feraheme What should I tell my health care provider before I take this medicine? They need to know if you have any of these conditions:  anemia not caused by low iron levels  high levels of iron in the blood  magnetic resonance imaging (MRI) test scheduled  an unusual or allergic reaction to iron, other medicines, foods, dyes, or preservatives  pregnant or trying to get pregnant  breast-feeding How should I use this medicine? This medicine is for injection into a vein. It is given by a health care professional in a hospital or clinic setting. Talk to your pediatrician regarding the use of this medicine in children.  Special care may be needed. Overdosage: If you think you have taken too much of this medicine contact a poison control center or emergency room at once. NOTE: This medicine is only for you. Do not share this medicine with others. What if I miss a dose? It is important not to miss your dose. Call your doctor or health care professional if you are unable to keep an appointment. What may interact with this medicine? This medicine may interact with the following medications:  other iron products This list may not describe all possible interactions. Give your health care provider a list of all the medicines, herbs, non-prescription drugs, or dietary supplements you use. Also tell them if you smoke, drink alcohol, or use illegal drugs. Some items may interact with your medicine. What should I watch for while using this medicine? Visit your doctor or healthcare professional regularly. Tell your doctor or healthcare professional if your symptoms do not start to get better or if they get worse. You may need blood work done while you are taking this medicine. You may need to follow a special diet. Talk to your doctor. Foods that contain iron include: whole grains/cereals, dried fruits,   beans, or peas, leafy green vegetables, and organ meats (liver, kidney). What side effects may I notice from receiving this medicine? Side effects that you should report to your doctor or health care professional as soon as possible:  allergic reactions like skin rash, itching or hives, swelling of the face, lips, or tongue  breathing problems  changes in blood pressure  feeling faint or lightheaded, falls  fever or chills  flushing, sweating, or hot feelings  swelling of the ankles or feet Side effects that usually do not require medical attention (report to your doctor or health care professional if they continue or are bothersome):  diarrhea  headache  nausea, vomiting  stomach pain This list may not  describe all possible side effects. Call your doctor for medical advice about side effects. You may report side effects to FDA at 1-800-FDA-1088. Where should I keep my medicine? This drug is given in a hospital or clinic and will not be stored at home. NOTE: This sheet is a summary. It may not cover all possible information. If you have questions about this medicine, talk to your doctor, pharmacist, or health care provider.  2020 Elsevier/Gold Standard (2016-11-12 20:21:10)  

## 2019-10-30 ENCOUNTER — Ambulatory Visit (HOSPITAL_COMMUNITY)
Admission: RE | Admit: 2019-10-30 | Discharge: 2019-10-30 | Disposition: A | Discharge status: Home / Self Care | Payer: Medicare Other | Source: Ambulatory Visit | Attending: Internal Medicine | Admitting: Internal Medicine

## 2019-10-30 ENCOUNTER — Other Ambulatory Visit: Payer: Self-pay

## 2019-10-30 VITALS — BP 150/72 | HR 81 | Temp 98.0°F | Resp 18 | Ht 64.0 in | Wt 254.0 lb

## 2019-10-30 DIAGNOSIS — N179 Acute kidney failure, unspecified: Secondary | ICD-10-CM

## 2019-10-30 DIAGNOSIS — N184 Chronic kidney disease, stage 4 (severe): Secondary | ICD-10-CM | POA: Insufficient documentation

## 2019-10-30 LAB — POCT HEMOGLOBIN-HEMACUE: Hemoglobin: 9.3 g/dL — ABNORMAL LOW (ref 12.0–15.0)

## 2019-10-30 MED ORDER — EPOETIN ALFA-EPBX 10000 UNIT/ML IJ SOLN
INTRAMUSCULAR | Status: AC
  Administered 2019-10-30: 11:00:00 20000 [IU] via SUBCUTANEOUS
  Filled 2019-10-30: qty 2

## 2019-10-30 MED ORDER — SODIUM CHLORIDE 0.9 % IV SOLN
510.0000 mg | INTRAVENOUS | Status: DC
  Administered 2019-10-30: 510 mg via INTRAVENOUS
  Filled 2019-10-30: qty 17

## 2019-10-30 MED ORDER — EPOETIN ALFA-EPBX 10000 UNIT/ML IJ SOLN: 20000.0000 [IU] | INTRAMUSCULAR | Status: DC

## 2019-11-06 ENCOUNTER — Encounter (HOSPITAL_COMMUNITY)
Admission: RE | Admit: 2019-11-06 | Discharge: 2019-11-06 | Disposition: A | Discharge status: Home / Self Care | Payer: Medicare Other | Source: Ambulatory Visit | Attending: Internal Medicine | Admitting: Internal Medicine

## 2019-11-06 DIAGNOSIS — D631 Anemia in chronic kidney disease: Secondary | ICD-10-CM | POA: Diagnosis present

## 2019-11-06 DIAGNOSIS — N189 Chronic kidney disease, unspecified: Secondary | ICD-10-CM | POA: Diagnosis not present

## 2019-11-06 MED ORDER — SODIUM CHLORIDE 0.9 % IV SOLN
510.0000 mg | INTRAVENOUS | Status: DC
  Administered 2019-11-06: 510 mg via INTRAVENOUS
  Filled 2019-11-06: qty 17

## 2019-11-27 ENCOUNTER — Encounter (HOSPITAL_COMMUNITY): Payer: Medicare Other

## 2019-12-04 ENCOUNTER — Ambulatory Visit (HOSPITAL_COMMUNITY)
Admission: RE | Admit: 2019-12-04 | Discharge: 2019-12-04 | Disposition: A | Discharge status: Home / Self Care | Payer: Medicare Other | Source: Ambulatory Visit | Attending: Internal Medicine | Admitting: Internal Medicine

## 2019-12-04 ENCOUNTER — Other Ambulatory Visit: Payer: Self-pay

## 2019-12-04 VITALS — BP 160/62 | HR 78 | Temp 96.8°F | Resp 18

## 2019-12-04 DIAGNOSIS — N179 Acute kidney failure, unspecified: Secondary | ICD-10-CM | POA: Diagnosis not present

## 2019-12-04 DIAGNOSIS — N184 Chronic kidney disease, stage 4 (severe): Secondary | ICD-10-CM | POA: Diagnosis present

## 2019-12-04 LAB — RENAL FUNCTION PANEL
Albumin: 3.4 g/dL — ABNORMAL LOW (ref 3.5–5.0)
Anion gap: 15 (ref 5–15)
BUN: 130 mg/dL — ABNORMAL HIGH (ref 8–23)
CO2: 30 mmol/L (ref 22–32)
Calcium: 9.4 mg/dL (ref 8.9–10.3)
Chloride: 92 mmol/L — ABNORMAL LOW (ref 98–111)
Creatinine, Ser: 3.38 mg/dL — ABNORMAL HIGH (ref 0.44–1.00)
GFR calc Af Amer: 15 mL/min — ABNORMAL LOW (ref >60)
GFR calc non Af Amer: 13 mL/min — ABNORMAL LOW (ref >60)
Glucose, Bld: 137 mg/dL — ABNORMAL HIGH (ref 70–99)
Phosphorus: 4.6 mg/dL (ref 2.5–4.6)
Potassium: 3.2 mmol/L — ABNORMAL LOW (ref 3.5–5.1)
Sodium: 137 mmol/L (ref 135–145)

## 2019-12-04 LAB — IRON AND TIBC
Iron: 83 ug/dL (ref 28–170)
Saturation Ratios: 15 % (ref 10.4–31.8)
TIBC: 545 ug/dL — ABNORMAL HIGH (ref 250–450)
UIBC: 462 ug/dL

## 2019-12-04 LAB — POCT HEMOGLOBIN-HEMACUE: Hemoglobin: 9.1 g/dL — ABNORMAL LOW (ref 12.0–15.0)

## 2019-12-04 LAB — FERRITIN: Ferritin: 160 ng/mL (ref 11–307)

## 2019-12-04 MED ORDER — EPOETIN ALFA-EPBX 10000 UNIT/ML IJ SOLN
INTRAMUSCULAR | Status: AC
  Filled 2019-12-04: qty 2

## 2019-12-04 MED ORDER — EPOETIN ALFA-EPBX 10000 UNIT/ML IJ SOLN
20000.0000 [IU] | INTRAMUSCULAR | Status: DC
  Administered 2019-12-04: 20000 [IU] via SUBCUTANEOUS

## 2019-12-05 LAB — PTH, INTACT AND CALCIUM
Calcium, Total (PTH): 9.7 mg/dL (ref 8.7–10.3)
PTH: 79 pg/mL — ABNORMAL HIGH (ref 15–65)

## 2019-12-31 ENCOUNTER — Other Ambulatory Visit (HOSPITAL_COMMUNITY): Payer: Self-pay | Admitting: *Deleted

## 2020-01-01 ENCOUNTER — Other Ambulatory Visit: Payer: Self-pay

## 2020-01-01 ENCOUNTER — Ambulatory Visit (HOSPITAL_COMMUNITY)
Admission: RE | Admit: 2020-01-01 | Discharge: 2020-01-01 | Disposition: A | Discharge status: Home / Self Care | Payer: Medicare Other | Source: Ambulatory Visit | Attending: Internal Medicine | Admitting: Internal Medicine

## 2020-01-01 VITALS — BP 136/67 | HR 73 | Resp 20

## 2020-01-01 DIAGNOSIS — N184 Chronic kidney disease, stage 4 (severe): Secondary | ICD-10-CM | POA: Diagnosis present

## 2020-01-01 DIAGNOSIS — N179 Acute kidney failure, unspecified: Secondary | ICD-10-CM | POA: Insufficient documentation

## 2020-01-01 LAB — IRON AND TIBC
Iron: 50 ug/dL (ref 28–170)
Saturation Ratios: 10 % — ABNORMAL LOW (ref 10.4–31.8)
TIBC: 508 ug/dL — ABNORMAL HIGH (ref 250–450)
UIBC: 458 ug/dL

## 2020-01-01 LAB — FERRITIN: Ferritin: 31 ng/mL (ref 11–307)

## 2020-01-01 LAB — POCT HEMOGLOBIN-HEMACUE: Hemoglobin: 9.3 g/dL — ABNORMAL LOW (ref 12.0–15.0)

## 2020-01-01 MED ORDER — EPOETIN ALFA-EPBX 10000 UNIT/ML IJ SOLN
INTRAMUSCULAR | Status: AC
  Filled 2020-01-01: qty 3

## 2020-01-01 MED ORDER — EPOETIN ALFA-EPBX 10000 UNIT/ML IJ SOLN
30000.0000 [IU] | INTRAMUSCULAR | Status: DC
  Administered 2020-01-01: 30000 [IU] via SUBCUTANEOUS

## 2020-01-05 ENCOUNTER — Encounter (HOSPITAL_COMMUNITY): Payer: Self-pay | Admitting: Emergency Medicine

## 2020-01-05 ENCOUNTER — Inpatient Hospital Stay (HOSPITAL_COMMUNITY)
Admission: EM | Admit: 2020-01-05 | Discharge: 2020-01-14 | DRG: 871 | Disposition: A | Discharge status: Home Health | Payer: Medicare Other | Attending: Internal Medicine | Admitting: Internal Medicine

## 2020-01-05 ENCOUNTER — Other Ambulatory Visit: Payer: Self-pay

## 2020-01-05 DIAGNOSIS — B9620 Unspecified Escherichia coli [E. coli] as the cause of diseases classified elsewhere: Secondary | ICD-10-CM | POA: Diagnosis present

## 2020-01-05 DIAGNOSIS — Z6836 Body mass index (BMI) 36.0-36.9, adult: Secondary | ICD-10-CM | POA: Diagnosis present

## 2020-01-05 DIAGNOSIS — E1129 Type 2 diabetes mellitus with other diabetic kidney complication: Secondary | ICD-10-CM | POA: Diagnosis present

## 2020-01-05 DIAGNOSIS — R7881 Bacteremia: Secondary | ICD-10-CM | POA: Diagnosis not present

## 2020-01-05 DIAGNOSIS — Z9049 Acquired absence of other specified parts of digestive tract: Secondary | ICD-10-CM

## 2020-01-05 DIAGNOSIS — Z79890 Hormone replacement therapy (postmenopausal): Secondary | ICD-10-CM

## 2020-01-05 DIAGNOSIS — N179 Acute kidney failure, unspecified: Secondary | ICD-10-CM | POA: Diagnosis present

## 2020-01-05 DIAGNOSIS — E6609 Other obesity due to excess calories: Secondary | ICD-10-CM | POA: Diagnosis present

## 2020-01-05 DIAGNOSIS — Z833 Family history of diabetes mellitus: Secondary | ICD-10-CM

## 2020-01-05 DIAGNOSIS — Z8249 Family history of ischemic heart disease and other diseases of the circulatory system: Secondary | ICD-10-CM

## 2020-01-05 DIAGNOSIS — I509 Heart failure, unspecified: Secondary | ICD-10-CM

## 2020-01-05 DIAGNOSIS — Z96641 Presence of right artificial hip joint: Secondary | ICD-10-CM | POA: Diagnosis present

## 2020-01-05 DIAGNOSIS — N390 Urinary tract infection, site not specified: Secondary | ICD-10-CM | POA: Diagnosis present

## 2020-01-05 DIAGNOSIS — Z79899 Other long term (current) drug therapy: Secondary | ICD-10-CM

## 2020-01-05 DIAGNOSIS — E785 Hyperlipidemia, unspecified: Secondary | ICD-10-CM | POA: Diagnosis present

## 2020-01-05 DIAGNOSIS — F17210 Nicotine dependence, cigarettes, uncomplicated: Secondary | ICD-10-CM | POA: Diagnosis present

## 2020-01-05 DIAGNOSIS — Z720 Tobacco use: Secondary | ICD-10-CM | POA: Diagnosis present

## 2020-01-05 DIAGNOSIS — I5023 Acute on chronic systolic (congestive) heart failure: Secondary | ICD-10-CM | POA: Diagnosis not present

## 2020-01-05 DIAGNOSIS — Z90710 Acquired absence of both cervix and uterus: Secondary | ICD-10-CM

## 2020-01-05 DIAGNOSIS — J9601 Acute respiratory failure with hypoxia: Secondary | ICD-10-CM | POA: Diagnosis present

## 2020-01-05 DIAGNOSIS — N185 Chronic kidney disease, stage 5: Secondary | ICD-10-CM | POA: Diagnosis present

## 2020-01-05 DIAGNOSIS — D649 Anemia, unspecified: Secondary | ICD-10-CM | POA: Diagnosis present

## 2020-01-05 DIAGNOSIS — E11649 Type 2 diabetes mellitus with hypoglycemia without coma: Secondary | ICD-10-CM | POA: Diagnosis not present

## 2020-01-05 DIAGNOSIS — Z8614 Personal history of Methicillin resistant Staphylococcus aureus infection: Secondary | ICD-10-CM

## 2020-01-05 DIAGNOSIS — E871 Hypo-osmolality and hyponatremia: Secondary | ICD-10-CM | POA: Diagnosis present

## 2020-01-05 DIAGNOSIS — A4151 Sepsis due to Escherichia coli [E. coli]: Principal | ICD-10-CM | POA: Diagnosis present

## 2020-01-05 DIAGNOSIS — R6510 Systemic inflammatory response syndrome (SIRS) of non-infectious origin without acute organ dysfunction: Secondary | ICD-10-CM

## 2020-01-05 DIAGNOSIS — R0902 Hypoxemia: Secondary | ICD-10-CM

## 2020-01-05 DIAGNOSIS — R112 Nausea with vomiting, unspecified: Secondary | ICD-10-CM

## 2020-01-05 DIAGNOSIS — E875 Hyperkalemia: Secondary | ICD-10-CM | POA: Diagnosis not present

## 2020-01-05 DIAGNOSIS — Z794 Long term (current) use of insulin: Secondary | ICD-10-CM

## 2020-01-05 DIAGNOSIS — Z6841 Body Mass Index (BMI) 40.0 and over, adult: Secondary | ICD-10-CM

## 2020-01-05 DIAGNOSIS — I132 Hypertensive heart and chronic kidney disease with heart failure and with stage 5 chronic kidney disease, or end stage renal disease: Secondary | ICD-10-CM | POA: Diagnosis present

## 2020-01-05 DIAGNOSIS — E1151 Type 2 diabetes mellitus with diabetic peripheral angiopathy without gangrene: Secondary | ICD-10-CM | POA: Diagnosis present

## 2020-01-05 DIAGNOSIS — E1122 Type 2 diabetes mellitus with diabetic chronic kidney disease: Secondary | ICD-10-CM | POA: Diagnosis present

## 2020-01-05 DIAGNOSIS — D631 Anemia in chronic kidney disease: Secondary | ICD-10-CM | POA: Diagnosis present

## 2020-01-05 DIAGNOSIS — N184 Chronic kidney disease, stage 4 (severe): Secondary | ICD-10-CM | POA: Diagnosis present

## 2020-01-05 DIAGNOSIS — Z20822 Contact with and (suspected) exposure to covid-19: Secondary | ICD-10-CM | POA: Diagnosis present

## 2020-01-05 DIAGNOSIS — I10 Essential (primary) hypertension: Secondary | ICD-10-CM | POA: Diagnosis present

## 2020-01-05 DIAGNOSIS — I5043 Acute on chronic combined systolic (congestive) and diastolic (congestive) heart failure: Secondary | ICD-10-CM

## 2020-01-05 DIAGNOSIS — Z8673 Personal history of transient ischemic attack (TIA), and cerebral infarction without residual deficits: Secondary | ICD-10-CM

## 2020-01-05 DIAGNOSIS — E890 Postprocedural hypothyroidism: Secondary | ICD-10-CM | POA: Diagnosis present

## 2020-01-05 DIAGNOSIS — Z7902 Long term (current) use of antithrombotics/antiplatelets: Secondary | ICD-10-CM

## 2020-01-05 DIAGNOSIS — J449 Chronic obstructive pulmonary disease, unspecified: Secondary | ICD-10-CM | POA: Diagnosis present

## 2020-01-05 DIAGNOSIS — N2581 Secondary hyperparathyroidism of renal origin: Secondary | ICD-10-CM | POA: Diagnosis present

## 2020-01-05 DIAGNOSIS — E1140 Type 2 diabetes mellitus with diabetic neuropathy, unspecified: Secondary | ICD-10-CM | POA: Diagnosis present

## 2020-01-05 LAB — URINALYSIS, ROUTINE W REFLEX MICROSCOPIC
Bilirubin Urine: NEGATIVE
Glucose, UA: NEGATIVE mg/dL
Ketones, ur: NEGATIVE mg/dL
Nitrite: NEGATIVE
Protein, ur: 100 mg/dL — AB
RBC / HPF: >50 RBC/hpf — ABNORMAL HIGH (ref 0–5)
Specific Gravity, Urine: 1.013 (ref 1.005–1.030)
WBC, UA: >50 WBC/hpf — ABNORMAL HIGH (ref 0–5)
pH: 5 (ref 5.0–8.0)

## 2020-01-05 LAB — CBC
HCT: 28.8 % — ABNORMAL LOW (ref 36.0–46.0)
Hemoglobin: 8.9 g/dL — ABNORMAL LOW (ref 12.0–15.0)
MCH: 27.8 pg (ref 26.0–34.0)
MCHC: 30.9 g/dL (ref 30.0–36.0)
MCV: 90 fL (ref 80.0–100.0)
Platelets: 265 10*3/uL (ref 150–400)
RBC: 3.2 MIL/uL — ABNORMAL LOW (ref 3.87–5.11)
RDW: 17 % — ABNORMAL HIGH (ref 11.5–15.5)
WBC: 21.5 10*3/uL — ABNORMAL HIGH (ref 4.0–10.5)
nRBC: 0.1 % (ref 0.0–0.2)

## 2020-01-05 LAB — COMPREHENSIVE METABOLIC PANEL
ALT: 17 U/L (ref 0–44)
AST: 30 U/L (ref 15–41)
Albumin: 2.9 g/dL — ABNORMAL LOW (ref 3.5–5.0)
Alkaline Phosphatase: 57 U/L (ref 38–126)
Anion gap: 17 — ABNORMAL HIGH (ref 5–15)
BUN: 105 mg/dL — ABNORMAL HIGH (ref 8–23)
CO2: 28 mmol/L (ref 22–32)
Calcium: 8.8 mg/dL — ABNORMAL LOW (ref 8.9–10.3)
Chloride: 87 mmol/L — ABNORMAL LOW (ref 98–111)
Creatinine, Ser: 3.9 mg/dL — ABNORMAL HIGH (ref 0.44–1.00)
GFR calc Af Amer: 13 mL/min — ABNORMAL LOW (ref >60)
GFR calc non Af Amer: 11 mL/min — ABNORMAL LOW (ref >60)
Glucose, Bld: 311 mg/dL — ABNORMAL HIGH (ref 70–99)
Potassium: 3.9 mmol/L (ref 3.5–5.1)
Sodium: 132 mmol/L — ABNORMAL LOW (ref 135–145)
Total Bilirubin: 0.3 mg/dL (ref 0.3–1.2)
Total Protein: 6.2 g/dL — ABNORMAL LOW (ref 6.5–8.1)

## 2020-01-05 LAB — LIPASE, BLOOD: Lipase: 25 U/L (ref 11–51)

## 2020-01-05 NOTE — ED Triage Notes (Signed)
Pt reports her BP was low last night and this AM. Endorses chills, vomiting and abd pain.

## 2020-01-06 ENCOUNTER — Emergency Department (HOSPITAL_COMMUNITY): Payer: Medicare Other

## 2020-01-06 ENCOUNTER — Encounter (HOSPITAL_COMMUNITY): Payer: Self-pay | Admitting: Emergency Medicine

## 2020-01-06 DIAGNOSIS — N3001 Acute cystitis with hematuria: Secondary | ICD-10-CM | POA: Diagnosis not present

## 2020-01-06 DIAGNOSIS — N184 Chronic kidney disease, stage 4 (severe): Secondary | ICD-10-CM

## 2020-01-06 DIAGNOSIS — I5022 Chronic systolic (congestive) heart failure: Secondary | ICD-10-CM

## 2020-01-06 DIAGNOSIS — E1122 Type 2 diabetes mellitus with diabetic chronic kidney disease: Secondary | ICD-10-CM

## 2020-01-06 DIAGNOSIS — Z794 Long term (current) use of insulin: Secondary | ICD-10-CM

## 2020-01-06 DIAGNOSIS — N390 Urinary tract infection, site not specified: Secondary | ICD-10-CM | POA: Diagnosis present

## 2020-01-06 DIAGNOSIS — N185 Chronic kidney disease, stage 5: Secondary | ICD-10-CM

## 2020-01-06 DIAGNOSIS — I10 Essential (primary) hypertension: Secondary | ICD-10-CM

## 2020-01-06 LAB — CBC
HCT: 31.1 % — ABNORMAL LOW (ref 36.0–46.0)
Hemoglobin: 9.6 g/dL — ABNORMAL LOW (ref 12.0–15.0)
MCH: 27.9 pg (ref 26.0–34.0)
MCHC: 30.9 g/dL (ref 30.0–36.0)
MCV: 90.4 fL (ref 80.0–100.0)
Platelets: 289 10*3/uL (ref 150–400)
RBC: 3.44 MIL/uL — ABNORMAL LOW (ref 3.87–5.11)
RDW: 16.8 % — ABNORMAL HIGH (ref 11.5–15.5)
WBC: 24.3 10*3/uL — ABNORMAL HIGH (ref 4.0–10.5)
nRBC: 0.1 % (ref 0.0–0.2)

## 2020-01-06 LAB — BASIC METABOLIC PANEL
Anion gap: 15 (ref 5–15)
BUN: 111 mg/dL — ABNORMAL HIGH (ref 8–23)
CO2: 29 mmol/L (ref 22–32)
Calcium: 9 mg/dL (ref 8.9–10.3)
Chloride: 86 mmol/L — ABNORMAL LOW (ref 98–111)
Creatinine, Ser: 3.93 mg/dL — ABNORMAL HIGH (ref 0.44–1.00)
GFR calc Af Amer: 13 mL/min — ABNORMAL LOW (ref >60)
GFR calc non Af Amer: 11 mL/min — ABNORMAL LOW (ref >60)
Glucose, Bld: 277 mg/dL — ABNORMAL HIGH (ref 70–99)
Potassium: 3.8 mmol/L (ref 3.5–5.1)
Sodium: 130 mmol/L — ABNORMAL LOW (ref 135–145)

## 2020-01-06 LAB — BLOOD CULTURE ID PANEL (REFLEXED)

## 2020-01-06 LAB — PROTIME-INR
INR: 1.2 (ref 0.8–1.2)
Prothrombin Time: 14.7 seconds (ref 11.4–15.2)

## 2020-01-06 LAB — GLUCOSE, CAPILLARY
Glucose-Capillary: 312 mg/dL — ABNORMAL HIGH (ref 70–99)
Glucose-Capillary: 268 mg/dL — ABNORMAL HIGH (ref 70–99)

## 2020-01-06 LAB — LACTIC ACID, PLASMA
Lactic Acid, Venous: 1.1 mmol/L (ref 0.5–1.9)
Lactic Acid, Venous: 1.6 mmol/L (ref 0.5–1.9)

## 2020-01-06 LAB — HEMOGLOBIN A1C
Hgb A1c MFr Bld: 6.5 % — ABNORMAL HIGH (ref 4.8–5.6)
Mean Plasma Glucose: 139.85 mg/dL

## 2020-01-06 LAB — APTT: aPTT: 36 seconds (ref 24–36)

## 2020-01-06 LAB — CBG MONITORING, ED
Glucose-Capillary: 346 mg/dL — ABNORMAL HIGH (ref 70–99)
Glucose-Capillary: 382 mg/dL — ABNORMAL HIGH (ref 70–99)

## 2020-01-06 LAB — SARS CORONAVIRUS 2 (TAT 6-24 HRS): SARS Coronavirus 2: NEGATIVE

## 2020-01-06 MED ORDER — ACETAMINOPHEN 650 MG RE SUPP: 650.0000 mg | Freq: Four times a day (QID) | RECTAL | Status: DC | PRN

## 2020-01-06 MED ORDER — CINACALCET HCL 30 MG PO TABS
30.0000 mg | ORAL_TABLET | Freq: Every day | ORAL | Status: DC
  Administered 2020-01-06 – 2020-01-14 (×9): 30 mg via ORAL
  Filled 2020-01-06 (×9): qty 1

## 2020-01-06 MED ORDER — FENOFIBRATE 160 MG PO TABS
160.0000 mg | ORAL_TABLET | Freq: Every day | ORAL | Status: DC
  Administered 2020-01-06 – 2020-01-14 (×9): 160 mg via ORAL
  Filled 2020-01-06 (×9): qty 1

## 2020-01-06 MED ORDER — SODIUM CHLORIDE 0.9 % IV SOLN
1.0000 g | Freq: Once | INTRAVENOUS | Status: AC
  Administered 2020-01-06: 1 g via INTRAVENOUS
  Filled 2020-01-06: qty 1

## 2020-01-06 MED ORDER — PANTOPRAZOLE SODIUM 40 MG PO TBEC
40.0000 mg | DELAYED_RELEASE_TABLET | Freq: Every day | ORAL | Status: DC
  Administered 2020-01-06 – 2020-01-14 (×9): 40 mg via ORAL
  Filled 2020-01-06 (×9): qty 1

## 2020-01-06 MED ORDER — MOMETASONE FURO-FORMOTEROL FUM 200-5 MCG/ACT IN AERO
2.0000 | INHALATION_SPRAY | Freq: Two times a day (BID) | RESPIRATORY_TRACT | Status: DC
  Administered 2020-01-06 – 2020-01-14 (×17): 2 via RESPIRATORY_TRACT
  Filled 2020-01-06: qty 8.8

## 2020-01-06 MED ORDER — POLYETHYLENE GLYCOL 3350 17 G PO PACK
17.0000 g | PACK | Freq: Every day | ORAL | Status: DC
  Administered 2020-01-07 – 2020-01-11 (×5): 17 g via ORAL
  Filled 2020-01-06 (×6): qty 1

## 2020-01-06 MED ORDER — ONDANSETRON HCL 4 MG/2ML IJ SOLN
4.0000 mg | Freq: Four times a day (QID) | INTRAMUSCULAR | Status: DC | PRN
  Administered 2020-01-10: 4 mg via INTRAVENOUS
  Filled 2020-01-06: qty 2

## 2020-01-06 MED ORDER — SODIUM CHLORIDE 0.9 % IV SOLN
1.0000 g | INTRAVENOUS | Status: DC
  Administered 2020-01-06: 1 g via INTRAVENOUS
  Filled 2020-01-06 (×2): qty 10

## 2020-01-06 MED ORDER — INSULIN ASPART 100 UNIT/ML ~~LOC~~ SOLN
0.0000 [IU] | Freq: Three times a day (TID) | SUBCUTANEOUS | Status: DC
  Administered 2020-01-06 (×2): 11 [IU] via SUBCUTANEOUS
  Administered 2020-01-06: 15 [IU] via SUBCUTANEOUS
  Administered 2020-01-07: 3 [IU] via SUBCUTANEOUS
  Administered 2020-01-07: 5 [IU] via SUBCUTANEOUS
  Administered 2020-01-07: 3 [IU] via SUBCUTANEOUS
  Administered 2020-01-08: 2 [IU] via SUBCUTANEOUS
  Administered 2020-01-08 (×2): 3 [IU] via SUBCUTANEOUS
  Administered 2020-01-09: 2 [IU] via SUBCUTANEOUS
  Administered 2020-01-09: 3 [IU] via SUBCUTANEOUS
  Administered 2020-01-09: 2 [IU] via SUBCUTANEOUS
  Administered 2020-01-10 – 2020-01-11 (×2): 3 [IU] via SUBCUTANEOUS
  Administered 2020-01-11: 2 [IU] via SUBCUTANEOUS
  Administered 2020-01-11 – 2020-01-12 (×4): 3 [IU] via SUBCUTANEOUS
  Administered 2020-01-13: 5 [IU] via SUBCUTANEOUS
  Administered 2020-01-13: 2 [IU] via SUBCUTANEOUS
  Administered 2020-01-13: 3 [IU] via SUBCUTANEOUS
  Administered 2020-01-14: 5 [IU] via SUBCUTANEOUS
  Administered 2020-01-14: 2 [IU] via SUBCUTANEOUS

## 2020-01-06 MED ORDER — ADULT MULTIVITAMIN W/MINERALS CH
1.0000 | ORAL_TABLET | Freq: Every day | ORAL | Status: DC
  Administered 2020-01-06 – 2020-01-14 (×9): 1 via ORAL
  Filled 2020-01-06 (×10): qty 1

## 2020-01-06 MED ORDER — VITAMIN D 25 MCG (1000 UNIT) PO TABS
2000.0000 [IU] | ORAL_TABLET | Freq: Every day | ORAL | Status: DC
  Administered 2020-01-06 – 2020-01-14 (×9): 2000 [IU] via ORAL
  Filled 2020-01-06 (×11): qty 2

## 2020-01-06 MED ORDER — SODIUM CHLORIDE 0.9 % IV SOLN
2.0000 g | INTRAVENOUS | Status: DC
  Administered 2020-01-07 – 2020-01-08 (×2): 2 g via INTRAVENOUS
  Filled 2020-01-06 (×2): qty 2

## 2020-01-06 MED ORDER — INSULIN ASPART 100 UNIT/ML ~~LOC~~ SOLN
6.0000 [IU] | Freq: Three times a day (TID) | SUBCUTANEOUS | Status: DC
  Administered 2020-01-07 – 2020-01-09 (×8): 6 [IU] via SUBCUTANEOUS

## 2020-01-06 MED ORDER — HYDROXYZINE HCL 25 MG PO TABS
25.0000 mg | ORAL_TABLET | Freq: Two times a day (BID) | ORAL | Status: DC
  Administered 2020-01-06 – 2020-01-14 (×17): 25 mg via ORAL
  Filled 2020-01-06 (×17): qty 1

## 2020-01-06 MED ORDER — ONDANSETRON HCL 4 MG PO TABS: 4.0000 mg | ORAL_TABLET | Freq: Four times a day (QID) | ORAL | Status: DC | PRN

## 2020-01-06 MED ORDER — ALLOPURINOL 100 MG PO TABS
100.0000 mg | ORAL_TABLET | Freq: Every day | ORAL | Status: DC
  Administered 2020-01-06 – 2020-01-14 (×9): 100 mg via ORAL
  Filled 2020-01-06 (×9): qty 1

## 2020-01-06 MED ORDER — CLOPIDOGREL BISULFATE 75 MG PO TABS
75.0000 mg | ORAL_TABLET | Freq: Every day | ORAL | Status: DC
  Administered 2020-01-06 – 2020-01-14 (×9): 75 mg via ORAL
  Filled 2020-01-06 (×9): qty 1

## 2020-01-06 MED ORDER — POTASSIUM CHLORIDE ER 20 MEQ PO TBCR: 20.0000 meq | EXTENDED_RELEASE_TABLET | ORAL | Status: DC

## 2020-01-06 MED ORDER — INSULIN NPH (HUMAN) (ISOPHANE) 100 UNIT/ML ~~LOC~~ SUSP
20.0000 [IU] | Freq: Two times a day (BID) | SUBCUTANEOUS | Status: DC
  Administered 2020-01-06 – 2020-01-14 (×17): 20 [IU] via SUBCUTANEOUS
  Filled 2020-01-06: qty 10

## 2020-01-06 MED ORDER — CARVEDILOL 12.5 MG PO TABS
12.5000 mg | ORAL_TABLET | Freq: Two times a day (BID) | ORAL | Status: DC
  Administered 2020-01-06 – 2020-01-14 (×16): 12.5 mg via ORAL
  Filled 2020-01-06 (×17): qty 1

## 2020-01-06 MED ORDER — OXYCODONE-ACETAMINOPHEN 5-325 MG PO TABS
1.0000 | ORAL_TABLET | ORAL | Status: DC | PRN
  Administered 2020-01-07 – 2020-01-14 (×19): 1 via ORAL
  Filled 2020-01-06 (×19): qty 1

## 2020-01-06 MED ORDER — HEPARIN SODIUM (PORCINE) 5000 UNIT/ML IJ SOLN
5000.0000 [IU] | Freq: Three times a day (TID) | INTRAMUSCULAR | Status: DC
  Administered 2020-01-06 – 2020-01-14 (×25): 5000 [IU] via SUBCUTANEOUS
  Filled 2020-01-06 (×26): qty 1

## 2020-01-06 MED ORDER — SIMVASTATIN 20 MG PO TABS
20.0000 mg | ORAL_TABLET | Freq: Every day | ORAL | Status: DC
  Administered 2020-01-06 – 2020-01-13 (×8): 20 mg via ORAL
  Filled 2020-01-06 (×8): qty 1

## 2020-01-06 MED ORDER — FLUTICASONE PROPIONATE 50 MCG/ACT NA SUSP
2.0000 | Freq: Every day | NASAL | Status: DC | PRN
  Filled 2020-01-06: qty 16

## 2020-01-06 MED ORDER — OXYCODONE HCL 5 MG PO TABS
5.0000 mg | ORAL_TABLET | ORAL | Status: DC | PRN
  Administered 2020-01-07 – 2020-01-14 (×14): 5 mg via ORAL
  Filled 2020-01-06 (×14): qty 1

## 2020-01-06 MED ORDER — TIZANIDINE HCL 2 MG PO TABS
4.0000 mg | ORAL_TABLET | Freq: Two times a day (BID) | ORAL | Status: DC
  Administered 2020-01-06 – 2020-01-14 (×17): 4 mg via ORAL
  Filled 2020-01-06 (×8): qty 2
  Filled 2020-01-06: qty 1
  Filled 2020-01-06 (×9): qty 2
  Filled 2020-01-06: qty 1

## 2020-01-06 MED ORDER — POLYVINYL ALCOHOL 1.4 % OP SOLN
2.0000 [drp] | Freq: Every day | OPHTHALMIC | Status: DC | PRN
  Filled 2020-01-06: qty 15

## 2020-01-06 MED ORDER — TORSEMIDE 20 MG PO TABS
80.0000 mg | ORAL_TABLET | Freq: Two times a day (BID) | ORAL | Status: DC
  Administered 2020-01-06 – 2020-01-08 (×6): 80 mg via ORAL
  Filled 2020-01-06 (×8): qty 4

## 2020-01-06 MED ORDER — POTASSIUM CHLORIDE CRYS ER 20 MEQ PO TBCR
20.0000 meq | EXTENDED_RELEASE_TABLET | ORAL | Status: DC
  Administered 2020-01-06 – 2020-01-11 (×3): 20 meq via ORAL
  Filled 2020-01-06 (×4): qty 1

## 2020-01-06 MED ORDER — ACETAMINOPHEN 325 MG PO TABS
650.0000 mg | ORAL_TABLET | Freq: Four times a day (QID) | ORAL | Status: DC | PRN
  Administered 2020-01-06: 650 mg via ORAL
  Filled 2020-01-06: qty 2

## 2020-01-06 MED ORDER — LEVOTHYROXINE SODIUM 75 MCG PO TABS
150.0000 ug | ORAL_TABLET | Freq: Every day | ORAL | Status: DC
  Administered 2020-01-06 – 2020-01-14 (×9): 150 ug via ORAL
  Filled 2020-01-06 (×9): qty 2

## 2020-01-06 MED ORDER — ALBUTEROL SULFATE HFA 108 (90 BASE) MCG/ACT IN AERS: 2.0000 | INHALATION_SPRAY | Freq: Four times a day (QID) | RESPIRATORY_TRACT | Status: DC | PRN

## 2020-01-06 MED ORDER — OXYCODONE-ACETAMINOPHEN 10-325 MG PO TABS: 1.0000 | ORAL_TABLET | ORAL | Status: DC | PRN

## 2020-01-06 MED ORDER — ALBUTEROL SULFATE (2.5 MG/3ML) 0.083% IN NEBU
2.5000 mg | INHALATION_SOLUTION | Freq: Four times a day (QID) | RESPIRATORY_TRACT | Status: DC | PRN
  Administered 2020-01-06 – 2020-01-14 (×11): 2.5 mg via RESPIRATORY_TRACT
  Filled 2020-01-06 (×11): qty 3

## 2020-01-06 MED ORDER — METOLAZONE 5 MG PO TABS
5.0000 mg | ORAL_TABLET | ORAL | Status: DC
  Administered 2020-01-06: 5 mg via ORAL
  Filled 2020-01-06: qty 1

## 2020-01-06 MED ORDER — MIRABEGRON ER 25 MG PO TB24
25.0000 mg | ORAL_TABLET | Freq: Every day | ORAL | Status: DC
  Administered 2020-01-06 – 2020-01-14 (×9): 25 mg via ORAL
  Filled 2020-01-06 (×9): qty 1

## 2020-01-06 NOTE — ED Notes (Signed)
Phlebotomy at bedside attempting second set of blood cultures

## 2020-01-06 NOTE — ED Notes (Signed)
MS   bfast ordered  

## 2020-01-06 NOTE — Progress Notes (Signed)
Same day note  Patient seen and examined at bedside.  Patient was admitted to the hospital for chills abdominal pain.  Complains of mild cough.  Had one episode of vomiting.  Has not had a bowel movement in several days now  At the time of my evaluation, patient complains of mild cough.  Nursing staff.  Mild wheezing.  Physical examination reveals obese female.  Chest with diminished breath sounds and expiratory rhonchi.  Lower extremity edema on the right with tenderness on the left.  Laboratory data and imaging was reviewed  Assessment and Plan.  Abdominal pain, vomiting, chills and abnormal urinalysis.  Blood cultures showing gram-negative rods so far.  Likely UTI.  Continue IV Rocephin.  Follow urine culture. Urinalysis showed large hemoglobin protein RBC WBC and many bacteria.  Lipase was 25 and negative.  Gram-negative rod bacteremia.  Aerobic bottle only.  We will continue to monitor.  On Rocephin at this time.  Mild hyponatremia.  On torsemide and Zaroxolyn.  Will closely monitor check BMP in a.m.  CKD stage IV-V.  Chronic-stable.  Creatinine of 3.9.  Check BMP in a.m.  Chronic systolic congestive heart failure.  On high-dose torsemide and Zaroxolyn.  Resume.  Diabetes mellitus type II.  Continue sliding scale insulin long-acting insulin Accu-Cheks.  Diabetic coordinator on board and recommend adding NovoLog 6 units 3 times daily with meals.  Hemoglobin A1c of 6.5%  Essential hypertension.  Continue Coreg.  Closely monitor.  No Charge  Signed,  Delila Pereyra, MD [facility]

## 2020-01-06 NOTE — Progress Notes (Addendum)
PHARMACY - PHYSICIAN COMMUNICATION CRITICAL VALUE ALERT - BLOOD CULTURE IDENTIFICATION (BCID)  [name] [name] is an 69 y.o. female who presented to [facility] on 01/05/2020 with a chief complaint of sepsis and abdominal pain.   Assessment:  Ecoli with no KPC detected growing from 2/2 anaerobic bottles. WBC 24, Tmax 100.4 yesterday, BP low. Patient received 1g ceftriaxone, appropriate to continue but consider increasing to 2g Q24 hr given bacteremia.   Name of physician (or Provider) Contacted: Dr. Sherlon Handing  Current antibiotics: ceftriaxone  Changes to prescribed antibiotics recommended:  Increase ceftriaxone dose to 2g Q24 hr. MD agreed. Entered 1g once now (about 13 hours after first dose) and 2g to start tomorrow morning.   Benetta Spar, PharmD, BCPS, BCCP Clinical Pharmacist  Please check AMION for all [place] phone numbers After 10:00 PM, call [place] [phone]

## 2020-01-06 NOTE — Progress Notes (Signed)
Pt arrived to the floor from ED. Vita;s as followed: B/P 105/51, HR95, RR26 and O2 Sats 100% at RA scoring yellow MEWS.Marland Kitchen  Lung sounds show expiratory wheezing.  Bilateral Lower extremity edema 3+.  Nebulizing treatment given, MD notified via amion. Awaiting for futher orders. Yellow MEWS protocol implemented. Will continue to monitor.

## 2020-01-06 NOTE — ED Provider Notes (Signed)
[facility] EMERGENCY DEPARTMENT Provider Note   CSN: [id number] Arrival date & time: 01/05/20  1724     History Chief Complaint  Patient presents with  . Chills  . Abdominal Pain    [name] [name] is a 68 y.o. female.  The history is provided by the patient.  Abdominal Pain Pain location:  Generalized Pain quality: aching   Pain radiates to:  Does not radiate Pain severity:  Moderate Onset quality:  Gradual Duration:  2 days Timing:  Constant Progression:  Unchanged Chronicity:  New Context: retching   Context: not alcohol use   Relieved by:  Nothing Worsened by:  Nothing Ineffective treatments:  None tried Associated symptoms: chills, dysuria, fever, nausea and vomiting   Associated symptoms: no anorexia, no chest pain and no shortness of breath   Risk factors: being elderly   Risk factors: no alcohol abuse        Past Medical History:  Diagnosis Date  . Anemia   . Arthritis   . Asthma   . Bronchitis   . CHF (congestive heart failure) ([place])   . Chronic kidney disease   . Chronic kidney disease   . Chronic pain syndrome   . COPD (chronic obstructive pulmonary disease) ([place])   . Cramping of feet   . Diabetes mellitus   . Dyspnea   . GERD (gastroesophageal reflux disease)   . Gout   . Headache   . History of hiatal hernia   . History of hip replacement, total   . History of transfusion   . Hx MRSA infection    abscess left groin  . Hx of cardiac cath 06/2014  . Hyperlipidemia   . Hypertension   . Hypothyroid   . 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 Morbidly obese ([place])   . Peripheral vascular disease ([place])   . Positive cardiac stress test 12/2013   anterior and lateral ischemia on Myoview  . Sleep apnea    UNABLE TO TOLERATE C PAP  . Stress incontinence   . Thyroid disease   . TIA (transient ischemic attack)     X2 NO RESIDUAL PROBLEMS    Patient Active Problem List   Diagnosis Date Noted  . Anemia 10/26/2019  .  S/P lumbar fusion 09/18/2019  . Chronic CHF (congestive heart failure) ([place]) 03/16/2019  . Leukocytosis 03/16/2019  . Chest pain 11/27/2018  . Pre-syncope 11/27/2018  . Epigastric abdominal pain 11/27/2018  . Solitary pulmonary nodule 11/06/2018  . Hypertensive heart and renal disease, stage 1-4 or unspecified chronic kidney disease, with heart failure ([place]) 10/20/2018  . CHF (congestive heart failure), NYHA class III, acute on chronic, systolic ([place]) 62/94/7654  . 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 Cellulitis of abdominal wall 03/21/2015  . Acute on chronic renal failure ([place]) 03/21/2015  . DM (diabetes mellitus), type 2 with renal complications ([place]) 65/12/5463  . Abdominal wall abscess 03/21/2015  . Failed total hip arthroplasty ([place]) 08/11/2014  . Low urine output 08/11/2014  . Acute on chronic systolic CHF (congestive heart failure), NYHA class 3 ([place]) 08/11/2014  . Asthma, chronic 08/11/2014  . Hypothyroidism 08/11/2014  . OSA (obstructive sleep apnea) 08/11/2014  . Positive cardiac stress test 06/29/2014  . Hyperlipidemia 02/12/2014  . Left arm weakness 12/23/2013  . TIA (transient ischemic attack) 12/23/2013  . Left buttock abscess 10/12/2013  . Cellulitis and abscess of leg, right 04/16/2013  . Acute blood loss  anemia 02/14/2013  . HTN (hypertension) 02/14/2013  . Cellulitis and abscess of buttock 02/09/2013  . Morbid obesity ([place]) 02/03/2013  . Diarrhea 02/03/2013  . Hyponatremia 02/03/2013  . DM (diabetes mellitus) ([place]) 02/02/2013  . CKD (chronic kidney disease), stage IV ([place]) 02/02/2013  . Cellulitis 02/02/2013  . PAD (peripheral artery disease) ([place]) 04/26/2011  . Tobacco abuse 04/26/2011    Past Surgical History:  Procedure Laterality Date  . ABDOMINAL HYSTERECTOMY    . COLON SURGERY  1995   DUE TO POLYP  . DILATION AND CURETTAGE OF UTERUS    . FOREARM FRACTURE SURGERY     Left arm   . HERNIA REPAIR     w/ mesh  . INCISION AND DRAINAGE ABSCESS Left 02/04/2013   Procedure: INCISION AND DRAINAGE LEFT BUTTOCK ABSCESS; INCISION AND DRAINAGE LEFT BREAST ABSCESS;  Surgeon: Harl Bowie, MD;  Location: [facility];  Service: General;  Laterality: Left;  . INCISION AND DRAINAGE ABSCESS N/A 02/12/2013   Procedure: INCISION AND DEBRIDEMENT BUTTOCK WOUND ;  Surgeon: [name]. Georgette Dover, MD;  Location: [facility];  Service: General;  Laterality: N/A;  . INCISION AND DRAINAGE ABSCESS N/A 02/14/2013   Procedure: INCISION AND DRAINAGE/DRESSING CHANGE;  Surgeon: Harl Bowie, MD;  Location: [facility];  Service: General;  Laterality: N/A;  . INCISION AND DRAINAGE ABSCESS N/A 03/21/2015   Procedure: INCISION AND DRAINAGE PUBIC ABSCESS;  Surgeon: Excell Seltzer, MD;  Location: [facility];  Service: General;  Laterality: N/A;  . INCISION AND DRAINAGE PERIRECTAL ABSCESS Left 02/10/2013   Procedure: IRRIGATION AND DEBRIDEMENT OF BUTTOCK/PERINEAL ABSCESS;  Surgeon: [name]. Georgette Dover, MD;  Location: [facility];  Service: General;  Laterality: Left;  . INCISION AND DRAINAGE PERIRECTAL ABSCESS N/A 02/16/2013   Procedure: IRRIGATION AND DEBRIDEMENT PERINEAL ABSCESS;  Surgeon: Zenovia Jarred, MD;  Location: [facility];  Service: General;  Laterality: N/A;  . IRRIGATION AND DEBRIDEMENT ABSCESS N/A 02/06/2013   Procedure: IRRIGATION AND DEBRIDEMENT BUTTOCK ABSCESS AND DRESSING CHANGE;  Surgeon: Harl Bowie, MD;  Location: [facility];  Service: General;  Laterality: N/A;  . IRRIGATION AND DEBRIDEMENT ABSCESS Left 02/08/2013   Procedure: IRRIGATION AND DEBRIDEMENT ABSCESS/DRESSING CHANGE;  Surgeon: Gwenyth Ober, MD;  Location: [facility];  Service: General;  Laterality: Left;  . JOINT REPLACEMENT  2010 / 2012   . LAPAROSCOPIC CHOLECYSTECTOMY    . LEFT HEART CATHETERIZATION WITH CORONARY ANGIOGRAM N/A 07/02/2014   Procedure: LEFT HEART CATHETERIZATION WITH CORONARY ANGIOGRAM;  Surgeon: Lorretta Harp, MD;  Location: [facility] C[facility] LAB;   Service: Cardiovascular;  Laterality: N/A;  . LOWER EXTREMITY ANGIOGRAM Right 04/20/2016   Procedure: Lower Extremity Angiogram;  Surgeon: Elam Dutch, MD;  Location: [facility] CV LAB;  Service: Cardiovascular;  Laterality: Right;  . PERIPHERAL VASCULAR CATHETERIZATION N/A 04/20/2016   Procedure: Abdominal Aortogram;  Surgeon: Elam Dutch, MD;  Location: [facility] CV LAB;  Service: Cardiovascular;  Laterality: N/A;  . PERIPHERAL VASCULAR CATHETERIZATION Right 04/20/2016   Procedure: Peripheral Vascular Intervention;  Surgeon: Elam Dutch, MD;  Location: [facility] CV LAB;  Service: Cardiovascular;  Laterality: Right;  popiteal  . RIGHT HEART CATH N/A 10/27/2018   Procedure: RIGHT HEART CATH;  Surgeon: Larey Dresser, MD;  Location: [facility] CV LAB;  Service: Cardiovascular;  Laterality: N/A;  . RIGHT HEART CATH N/A 03/20/2019   Procedure: RIGHT HEART CATH;  Surgeon: Larey Dresser, MD;  Location: [facility] CV LAB;  Service: Cardiovascular;  Laterality: N/A;  . THYROIDECTOMY    .  TOTAL HIP REVISION Right 08/11/2014   Procedure: RIGHT ACETABULAR REVISION;  Surgeon: Gearlean Alf, MD;  Location: [facility];  Service: Orthopedics;  Laterality: Right;  [facility] VASCULAR SURGERY       OB History   No obstetric history on file.     Family History  Problem Relation Age of Onset  . Diabetes Brother   . Cardiomyopathy Mother   . Heart disease Mother   . Cancer Father     Social History   Tobacco Use  . Smoking status: Current Some Day Smoker    Packs/day: 0.25    Years: 40.00    Pack years: 10.00    Types: Cigarettes  . Smokeless tobacco: Never Used  Substance Use Topics  . Alcohol use: Yes    Alcohol/week: 1.0 standard drinks    Types: 1 Standard drinks or equivalent per week    Comment: OCC  . Drug use: No    Comment: Quit four years ago.    Home Medications Prior to Admission medications   Medication Sig Start Date End Date Taking? Authorizing Provider   albuterol (PROAIR HFA) 108 (90 Base) MCG/ACT inhaler Inhale 2 puffs into the lungs 4 (four) times daily as needed for wheezing or shortness of breath.    [provider]  albuterol (PROVENTIL) (2.5 MG/3ML) 0.083% nebulizer solution Take 2.5 mg by nebulization every 6 (six) hours as needed for wheezing or shortness of breath.    [provider]  allopurinol (ZYLOPRIM) 100 MG tablet Take 100 mg by mouth every morning.     [provider]  B Complex Vitamins (VITAMIN B COMPLEX PO) Take 1 tablet by mouth 2 (two) times daily.     [provider]  budesonide-formoterol (SYMBICORT) 160-4.5 MCG/ACT inhaler Inhale 2 puffs into the lungs 2 (two) times daily as needed (for SOB).     [provider]  carvedilol (COREG) 6.25 MG tablet Take 12.5 mg by mouth 2 (two) times daily.     [provider]  cetirizine (ZYRTEC) 10 MG tablet Take 10 mg by mouth daily.  03/14/15   [provider]  Cholecalciferol (VITAMIN D) 50 MCG (2000 UT) CAPS Take 2,000 Units by mouth daily.    [provider]  cinacalcet (SENSIPAR) 30 MG tablet Take 30 mg by mouth daily.    [provider]  clopidogrel (PLAVIX) 75 MG tablet TAKE 1 TABLET BY MOUTH EVERY DAY Patient taking differently: Take 75 mg by mouth daily.  06/18/17   Waynetta Sandy, MD  Dulaglutide 1.5 MG/0.5ML SOPN Inject 1.5 mg into the skin every Monday.     [provider]  fenofibrate (TRICOR) 145 MG tablet TAKE 1 TABLET (145 MG TOTAL) BY MOUTH DAILY. Patient taking differently: Take 145 mg by mouth daily.  05/11/15   Lorretta Harp, MD  ferrous sulfate 325 (65 FE) MG tablet Take 1 tablet (325 mg total) by mouth daily with breakfast. 10/27/19   Black, Lezlie Octave, NP  fluticasone (FLONASE) 50 MCG/ACT nasal spray Place 2 sprays into the nose as needed for allergies.     [provider]  hydrOXYzine (ATARAX/VISTARIL) 25 MG tablet Take 25 mg by mouth 2 (two) times daily.      [provider]  insulin lispro protamine-lispro (HUMALOG 75/25 MIX) (75-25) 100 UNIT/ML SUSP injection Inject 35-40 Units into the skin See admin instructions. Inject 40 units into the skin morning and 35 units in the evening    [provider]  Iron-Vitamins (GERITOL TONIC PO) Take 15 mLs by mouth daily.    [provider]  levothyroxine (SYNTHROID, LEVOTHROID) 150 MCG tablet Take 150 mcg by mouth daily before breakfast.    [provider]  metolazone (ZAROXOLYN) 5 MG tablet Take 5 mg by mouth every Monday, Wednesday, and Friday.  05/22/19   [provider]  mirabegron ER (MYRBETRIQ) 50 MG TB24 tablet Take 50 mg by mouth daily.    [provider]  Multiple Vitamin (MULTIVITAMIN WITH MINERALS) TABS tablet Take 1 tablet by mouth daily.    [provider]  oxyCODONE-acetaminophen (PERCOCET) 10-325 MG tablet Take 1 tablet by mouth every 4 (four) hours as needed for pain. 09/22/19   Eustace Moore, MD  pantoprazole (PROTONIX) 40 MG tablet Take 1 tablet (40 mg total) by mouth daily. 03/22/19   [place] Shi, MD  polyvinyl alcohol (LIQUIFILM TEARS) 1.4 % ophthalmic solution Place 2 drops into both eyes daily as needed for dry eyes.    [provider]  Potassium Chloride ER 20 MEQ TBCR Take 20 mEq by mouth every Monday, Wednesday, and Friday.  07/15/19   [provider]  simvastatin (ZOCOR) 20 MG tablet Take 20 mg by mouth at bedtime. 04/08/17   [provider]  tiZANidine (ZANAFLEX) 4 MG tablet Take 4 mg by mouth 2 (two) times daily.     [provider]  torsemide (DEMADEX) 20 MG tablet Take 4 tablets (80 mg total) by mouth every morning AND 2 tablets (40 mg total) every evening. Patient taking differently: Take 80 mg by mouth every morning and 40 every evening 10/31/18   Mariel Aloe, MD    Allergies    Nebivolol, Ace inhibitors, and Morphine and related  Review of Systems   Review of Systems   Constitutional: Positive for chills and fever.  HENT: Negative for congestion.   Eyes: Negative for visual disturbance.  Respiratory: Negative for shortness of breath.   Cardiovascular: Negative for chest pain.  Gastrointestinal: Positive for abdominal pain, nausea and vomiting. Negative for anorexia.  Genitourinary: Positive for dysuria. Negative for difficulty urinating and flank pain.  Neurological: Negative for dizziness.  Psychiatric/Behavioral: Negative for agitation.  All other systems reviewed and are negative.   Physical Exam Updated Vital Signs BP (!) 132/53   Pulse 95   Temp 99.3 F (37.4 C) (Oral)   Resp 19   Ht 5\' 3"  (1.6 m)   Wt 113.4 kg   SpO2 99%   BMI 44.29 kg/m   Physical Exam Vitals and nursing note reviewed.  Constitutional:      General: She is not in acute distress.    Appearance: Normal appearance.  HENT:     Head: Normocephalic and atraumatic.     Nose: Nose normal.  Eyes:     Conjunctiva/sclera: Conjunctivae normal.     Pupils: Pupils are equal, round, and reactive to light.  Cardiovascular:     Rate and Rhythm: Normal rate and regular rhythm.     Pulses: Normal pulses.     Heart sounds: Normal heart sounds.  Pulmonary:     Effort: Pulmonary effort is normal.     Breath sounds: Normal breath sounds.  Abdominal:     General: Abdomen is flat. Bowel sounds are normal.     Tenderness: There is no abdominal tenderness. There is no guarding or rebound.  Musculoskeletal:        General: Normal range of motion.     Cervical back: Normal range  of motion and neck supple.     Right lower leg: Edema present.     Left lower leg: Edema present.  Skin:    General: Skin is warm and dry.     Capillary Refill: Capillary refill takes less than 2 seconds.  Neurological:     General: No focal deficit present.     Mental Status: She is alert and oriented to person, place, and time.     Deep Tendon Reflexes: Reflexes normal.  Psychiatric:        Mood and  Affect: Mood normal.        Behavior: Behavior normal.     ED Results / Procedures / Treatments   Labs (all labs ordered are listed, but only abnormal results are displayed) Results for orders placed or performed during the hospital encounter of 01/05/20  Lipase, blood  Result Value Ref Range   Lipase 25 11 - 51 U/L  Comprehensive metabolic panel  Result Value Ref Range   Sodium 132 (L) 135 - 145 mmol/L   Potassium 3.9 3.5 - 5.1 mmol/L   Chloride 87 (L) 98 - 111 mmol/L   CO2 28 22 - 32 mmol/L   Glucose, Bld 311 (H) 70 - 99 mg/dL   BUN 105 (H) 8 - 23 mg/dL   Creatinine, Ser 3.90 (H) 0.44 - 1.00 mg/dL   Calcium 8.8 (L) 8.9 - 10.3 mg/dL   Total Protein 6.2 (L) 6.5 - 8.1 g/dL   Albumin 2.9 (L) 3.5 - 5.0 g/dL   AST 30 15 - 41 U/L   ALT 17 0 - 44 U/L   Alkaline Phosphatase 57 38 - 126 U/L   Total Bilirubin 0.3 0.3 - 1.2 mg/dL   GFR calc non Af Amer 11 (L) >60 mL/min   GFR calc Af Amer 13 (L) >60 mL/min   Anion gap 17 (H) 5 - 15  CBC  Result Value Ref Range   WBC 21.5 (H) 4.0 - 10.5 K/uL   RBC 3.20 (L) 3.87 - 5.11 MIL/uL   Hemoglobin 8.9 (L) 12.0 - 15.0 g/dL   HCT 28.8 (L) 36.0 - 46.0 %   MCV 90.0 80.0 - 100.0 fL   MCH 27.8 26.0 - 34.0 pg   MCHC 30.9 30.0 - 36.0 g/dL   RDW 17.0 (H) 11.5 - 15.5 %   Platelets 265 150 - 400 K/uL   nRBC 0.1 0.0 - 0.2 %  Urinalysis, Routine w reflex microscopic  Result Value Ref Range   Color, Urine AMBER (A) YELLOW   APPearance CLOUDY (A) CLEAR   Specific Gravity, Urine 1.013 1.005 - 1.030   pH 5.0 5.0 - 8.0   Glucose, UA NEGATIVE NEGATIVE mg/dL   Hgb urine dipstick LARGE (A) NEGATIVE   Bilirubin Urine NEGATIVE NEGATIVE   Ketones, ur NEGATIVE NEGATIVE mg/dL   Protein, ur 100 (A) NEGATIVE mg/dL   Nitrite NEGATIVE NEGATIVE   Leukocytes,Ua MODERATE (A) NEGATIVE   RBC / HPF >50 (H) 0 - 5 RBC/hpf   WBC, UA >50 (H) 0 - 5 WBC/hpf   Bacteria, UA MANY (A) NONE SEEN   Squamous Epithelial / LPF 0-5 0 - 5   WBC Clumps PRESENT    DG Chest Port 1  View  Result Date: 01/06/2020 CLINICAL DATA:  Hypotension EXAM: PORTABLE CHEST 1 VIEW COMPARISON:  09/15/2019 plain film, CT from 08/10/2019 FINDINGS: Cardiac shadow is within normal limits. Aortic calcifications are seen. The lungs are clear bilaterally. No focal infiltrate is noted. Subpleural fat  collection is again noted in the anterior aspect of the right apex stable from prior CT examination. Known left nodule is not as well appreciated on today's exam. No bony abnormality is noted. IMPRESSION: Chronic changes without acute abnormality. Electronically Signed   By: Inez Catalina M.D.   On: 01/06/2020 02:03    EKG EKG Interpretation  Date/Time:  Tuesday January 05 2020 18:33:20 EDT Ventricular Rate:  97 PR Interval:  184 QRS Duration: 134 QT Interval:  390 QTC Calculation: 495 R Axis:   -31 Text Interpretation: Normal sinus rhythm Left axis deviation Left ventricular hypertrophy with QRS widening and repolarization abnormality ( R in aVL , Cornell product ) Abnormal ECG No significant change since last tracing Confirmed by Merrily Pew [phone]) on 01/05/2020 11:24:17 PM   Radiology DG Chest Port 1 View  Result Date: 01/06/2020 CLINICAL DATA:  Hypotension EXAM: PORTABLE CHEST 1 VIEW COMPARISON:  09/15/2019 plain film, CT from 08/10/2019 FINDINGS: Cardiac shadow is within normal limits. Aortic calcifications are seen. The lungs are clear bilaterally. No focal infiltrate is noted. Subpleural fat collection is again noted in the anterior aspect of the right apex stable from prior CT examination. Known left nodule is not as well appreciated on today's exam. No bony abnormality is noted. IMPRESSION: Chronic changes without acute abnormality. Electronically Signed   By: Inez Catalina M.D.   On: 01/06/2020 02:03    Procedures Procedures (including critical care time)  Medications Ordered in ED Medications  cefTRIAXone (ROCEPHIN) 1 g in sodium chloride 0.9 % 100 mL IVPB (has no administration in  time range)    ED Course  I have reviewed the triage vital signs and the nursing notes.  Pertinent labs & imaging results that were available during my care of the patient were reviewed by me and considered in my medical decision making (see chart for details).    Pressures have normalized.  Admit for sirs to medicine  Final Clinical Impression(s) / ED Diagnoses Final diagnoses:  Lower urinary tract infectious disease  SIRS (systemic inflammatory response syndrome) (HCC)  Non-intractable vomiting with nausea, unspecified vomiting type   Admit to medicine    Careli Luzader, MD 01/06/20 [id number]

## 2020-01-06 NOTE — ED Notes (Signed)
No fluid bolus at this time per Dr. Randal Buba. VS stable BP 132/53.   Pt also presents with bilateral leg edema and respiratory wheezing.

## 2020-01-06 NOTE — ED Notes (Signed)
Lunch Tray Ordered @ [id number].

## 2020-01-06 NOTE — ED Notes (Signed)
Per Dr. Alcario Drought at bedside pt able to have eat. Meal and drink given to pt.

## 2020-01-06 NOTE — ED Notes (Signed)
Pulled patient up in the bed

## 2020-01-06 NOTE — ED Notes (Signed)
Difficulty obtaining IV access. Two RNs attempted. Unable to obtain second IV. Phlebotomy requested for blood cultures before starting antibiotics.

## 2020-01-06 NOTE — H&P (Signed)
History and Physical    [name] [name] JJK:[id number] DOB: 02/06/1951 DOA: 01/05/2020  PCP: [name], MD  Patient coming from: Home  I have personally briefly reviewed patient's old medical records in [place]  Chief Complaint: chills, abd pain  HPI: [name] [name] is a 69 y.o. 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  Pt presents to ED with 2 day h/o lower abd pain, associated N/V.  No radiation.  Associated chills, fever, dysuria, and reported hypotension at home.  No CP, no SOB.   ED Course: Tm 100.4 in ED, WBC 21.5k.  UA c/w UTI with large LE, 50 WBC.  BUN of 105 and creat 3.9 (this seems to be about baseline though).   Review of Systems: As per HPI, otherwise all review of systems negative.  Past Medical History:  Diagnosis Date  . Anemia   . Arthritis   . Asthma   . Bronchitis   . CHF (congestive heart failure) ([place])   . Chronic kidney disease   . Chronic kidney disease   . Chronic pain syndrome   . COPD (chronic obstructive pulmonary disease) ([place])   . Cramping of feet   . Diabetes mellitus   . Dyspnea   . GERD (gastroesophageal reflux disease)   . Gout   . Headache   . History of hiatal hernia   . History of hip replacement, total   . History of transfusion   . Hx MRSA infection    abscess left groin  . Hx of cardiac cath 06/2014  . Hyperlipidemia   . Hypertension   . Hypothyroid   . 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[place])   . Morbidly obese ([place])   . Peripheral vascular disease ([place])   . Positive cardiac stress test 12/2013   anterior and lateral ischemia on Myoview  . Sleep apnea    UNABLE TO TOLERATE C PAP  . Stress incontinence   . Thyroid disease   . TIA (transient ischemic attack)     X2 NO RESIDUAL PROBLEMS    Past Surgical History:  Procedure Laterality Date  . ABDOMINAL HYSTERECTOMY    . COLON SURGERY  1995   DUE TO POLYP  . DILATION AND CURETTAGE OF UTERUS    . FOREARM  FRACTURE SURGERY     Left arm  . HERNIA REPAIR     w/ mesh  . INCISION AND DRAINAGE ABSCESS Left 02/04/2013   Procedure: INCISION AND DRAINAGE LEFT BUTTOCK ABSCESS; INCISION AND DRAINAGE LEFT BREAST ABSCESS;  Surgeon: Harl Bowie, MD;  Location: [facility];  Service: General;  Laterality: Left;  . INCISION AND DRAINAGE ABSCESS N/A 02/12/2013   Procedure: INCISION AND DEBRIDEMENT BUTTOCK WOUND ;  Surgeon: [name]. Georgette Dover, MD;  Location: [facility];  Service: General;  Laterality: N/A;  . INCISION AND DRAINAGE ABSCESS N/A 02/14/2013   Procedure: INCISION AND DRAINAGE/DRESSING CHANGE;  Surgeon: Harl Bowie, MD;  Location: [facility];  Service: General;  Laterality: N/A;  . INCISION AND DRAINAGE ABSCESS N/A 03/21/2015   Procedure: INCISION AND DRAINAGE PUBIC ABSCESS;  Surgeon: Excell Seltzer, MD;  [facility];  Service: General;  Laterality: N/A;  . INCISION AND DRAINAGE PERIRECTAL ABSCESS Left 02/10/2013   Procedure: IRRIGATION AND DEBRIDEMENT OF BUTTOCK/PERINEAL ABSCESS;  Surgeon: [name]. Georgette Dover, MD;  Location: [facility];  Service: General;  Laterality: Left;  . INCISION AND DRAINAGE PERIRECTAL ABSCESS N/A 02/16/2013   Procedure: IRRIGATION AND DEBRIDEMENT PERINEAL  ABSCESS;  Surgeon: Zenovia Jarred, MD;  Location: [facility];  Service: General;  Laterality: N/A;  . IRRIGATION AND DEBRIDEMENT ABSCESS N/A 02/06/2013   Procedure: IRRIGATION AND DEBRIDEMENT BUTTOCK ABSCESS AND DRESSING CHANGE;  Surgeon: Harl Bowie, MD;  Location: [facility];  Service: General;  Laterality: N/A;  . IRRIGATION AND DEBRIDEMENT ABSCESS Left 02/08/2013   Procedure: IRRIGATION AND DEBRIDEMENT ABSCESS/DRESSING CHANGE;  Surgeon: Gwenyth Ober, MD;  Location: [facility];  Service: General;  Laterality: Left;  . JOINT REPLACEMENT  2010 / 2012   . LAPAROSCOPIC CHOLECYSTECTOMY    . LEFT HEART CATHETERIZATION WITH CORONARY ANGIOGRAM N/A 07/02/2014   Procedure: LEFT HEART CATHETERIZATION WITH CORONARY ANGIOGRAM;  Surgeon: Lorretta Harp,  MD;  Location: [facility] C[facility] LAB;  Service: Cardiovascular;  Laterality: N/A;  . LOWER EXTREMITY ANGIOGRAM Right 04/20/2016   Procedure: Lower Extremity Angiogram;  Surgeon: Elam Dutch, MD;  Location: [facility];  Service: Cardiovascular;  Laterality: Right;  . PERIPHERAL VASCULAR CATHETERIZATION N/A 04/20/2016   Procedure: Abdominal Aortogram;  Surgeon: Elam Dutch, MD;  Location: [facility];  Service: Cardiovascular;  Laterality: N/A;  . PERIPHERAL VASCULAR CATHETERIZATION Right 04/20/2016   Procedure: Peripheral Vascular Intervention;  Surgeon: Elam Dutch, MD;  Location: [facility];  Service: Cardiovascular;  Laterality: Right;  popiteal  . RIGHT HEART CATH N/A 10/27/2018   Procedure: RIGHT HEART CATH;  Surgeon: Larey Dresser, MD;  Location: [facility];  Service: Cardiovascular;  Laterality: N/A;  . RIGHT HEART CATH N/A 03/20/2019   Procedure: RIGHT HEART CATH;  Surgeon: Larey Dresser, MD;  Location: [facility];  Service: Cardiovascular;  Laterality: N/A;  . THYROIDECTOMY    . TOTAL HIP REVISION Right 08/11/2014   Procedure: RIGHT ACETABULAR REVISION;  Surgeon: Gearlean Alf, MD;  [facility];  Service: Orthopedics;  Laterality: Right;  [facility] VASCULAR SURGERY       reports that she has been smoking cigarettes. She has a 10.00 pack-year smoking history. She has never used smokeless tobacco. She reports current alcohol use of about 1.0 standard drinks of alcohol per week. She reports that she does not use drugs.  Allergies  Allergen Reactions  . Nebivolol Swelling    Chest pain  . Ace Inhibitors Swelling and Other (See Comments)    Tongue swell  . Morphine And Related Itching    Family History  Problem Relation Age of Onset  . Diabetes Brother   . Cardiomyopathy Mother   . Heart disease Mother   . Cancer Father      Prior to Admission medications   Medication Sig Start Date End Date Taking? Authorizing Provider  albuterol  (PROAIR HFA) 108 (90 Base) MCG/ACT inhaler Inhale 2 puffs into the lungs 4 (four) times daily as needed for wheezing or shortness of breath.    [provider]  albuterol (PROVENTIL) (2.5 MG/3ML) 0.083% nebulizer solution Take 2.5 mg by nebulization every 6 (six) hours as needed for wheezing or shortness of breath.    [provider]  allopurinol (ZYLOPRIM) 100 MG tablet Take 100 mg by mouth every morning.     [provider]  B Complex Vitamins (VITAMIN B COMPLEX PO) Take 1 tablet by mouth 2 (two) times daily.     [provider]  budesonide-formoterol (SYMBICORT) 160-4.5 MCG/ACT inhaler Inhale 2 puffs into the lungs 2 (two) times daily as needed (for SOB).     [provider]  carvedilol (COREG) 6.25 MG tablet Take 12.5 mg by mouth 2 (two) times daily.     [provider]  cetirizine (ZYRTEC) 10 MG tablet Take 10 mg by mouth daily.  03/14/15   [provider]  Cholecalciferol (VITAMIN D) 50 MCG (2000 UT) CAPS Take 2,000 Units by mouth daily.    [provider]  cinacalcet (SENSIPAR) 30 MG tablet Take 30 mg by mouth daily.    [provider]  clopidogrel (PLAVIX) 75 MG tablet TAKE 1 TABLET BY MOUTH EVERY DAY Patient taking differently: Take 75 mg by mouth daily.  06/18/17   Waynetta Sandy, MD  Dulaglutide 1.5 MG/0.5ML SOPN Inject 1.5 mg into the skin every Monday.     [provider]  fenofibrate (TRICOR) 145 MG tablet TAKE 1 TABLET (145 MG TOTAL) BY MOUTH DAILY. Patient taking differently: Take 145 mg by mouth daily.  05/11/15   Lorretta Harp, MD  fluticasone (FLONASE) 50 MCG/ACT nasal spray Place 2 sprays into the nose as needed for allergies.     [provider]  hydrOXYzine (ATARAX/VISTARIL) 25 MG tablet Take 25 mg by mouth 2 (two) times daily.     [provider]  insulin lispro protamine-lispro (HUMALOG 75/25 MIX) (75-25) 100 UNIT/ML SUSP injection Inject 35-40 Units into the  skin See admin instructions. Inject 40 units into the skin morning and 35 units in the evening    [provider]  Iron-Vitamins (GERITOL TONIC PO) Take 15 mLs by mouth daily.    [provider]  levothyroxine (SYNTHROID, LEVOTHROID) 150 MCG tablet Take 150 mcg by mouth daily before breakfast.    [provider]  metolazone (ZAROXOLYN) 5 MG tablet Take 5 mg by mouth every Monday, Wednesday, and Friday.  05/22/19   [provider]  mirabegron ER (MYRBETRIQ) 50 MG TB24 tablet Take 50 mg by mouth daily.    [provider]  Multiple Vitamin (MULTIVITAMIN WITH MINERALS) TABS tablet Take 1 tablet by mouth daily.    [provider]  oxyCODONE-acetaminophen (PERCOCET) 10-325 MG tablet Take 1 tablet by mouth every 4 (four) hours as needed for pain. 09/22/19   Eustace Moore, MD  pantoprazole (PROTONIX) 40 MG tablet Take 1 tablet (40 mg total) by mouth daily. 03/22/19   Guilford Shi, MD  polyvinyl alcohol (LIQUIFILM TEARS) 1.4 % ophthalmic solution Place 2 drops into both eyes daily as needed for dry eyes.    [provider]  Potassium Chloride ER 20 MEQ TBCR Take 20 mEq by mouth every Monday, Wednesday, and Friday.  07/15/19   [provider]  simvastatin (ZOCOR) 20 MG tablet Take 20 mg by mouth at bedtime. 04/08/17   [provider]  tiZANidine (ZANAFLEX) 4 MG tablet Take 4 mg by mouth 2 (two) times daily.     [provider]  torsemide (DEMADEX) 20 MG tablet Take 4 tablets (80 mg total) by mouth every morning AND 2 tablets (40 mg total) every evening. Patient taking differently: Take 80 mg by mouth every morning and 40 every evening 10/31/18   Mariel Aloe, MD    Physical Exam: Vitals:   01/06/20 0128 01/06/20 0129 01/06/20 0158 01/06/20 0208  BP: (!) 132/53     Pulse: 95     Resp: 19     Temp:  99.3 F (37.4 C)    TempSrc:  Oral    SpO2: 99%   99%  Weight:   113.4 kg   Height:  5\' 3"  (1.6 m)      Constitutional: NAD, calm, comfortable Eyes: PERRL, lids and conjunctivae normal ENMT: Mucous membranes are moist. Posterior pharynx clear of any exudate or lesions.Normal dentition.  Neck: normal, supple, no masses, no thyromegaly Respiratory: clear to auscultation bilaterally, no wheezing, no crackles. Normal respiratory effort. No accessory muscle use.  Cardiovascular: Regular rate and rhythm, no murmurs / rubs / gallops. Peripheral edema on all extremities. 2+ pedal pulses. No carotid bruits.  Abdomen: no tenderness, no masses palpated. No hepatosplenomegaly. Bowel sounds positive.  Musculoskeletal: no clubbing / cyanosis. No joint deformity upper and lower extremities. Good ROM, no contractures. Normal muscle tone.  Skin: no rashes, lesions, ulcers. No induration Neurologic: CN 2-12 grossly intact. Sensation intact, DTR normal. Strength 5/5 in all 4.  Psychiatric: Normal judgment and insight. Alert and oriented x 3. Normal mood.    Labs on Admission: I have personally reviewed following labs and imaging studies  CBC: Recent Labs  Lab 01/01/20 1116 01/05/20 1901  WBC  --  21.5*  HGB 9.3* 8.9*  HCT  --  28.8*  MCV  --  90.0  PLT  --  527   Basic Metabolic Panel: Recent Labs  Lab 01/05/20 1901  NA 132*  K 3.9  CL 87*  CO2 28  GLUCOSE 311*  BUN 105*  CREATININE 3.90*  CALCIUM 8.8*   GFR: Estimated Creatinine Clearance: 16.5 mL/min (A) (by C-G formula based on SCr of 3.9 mg/dL (H)). Liver Function Tests: Recent Labs  Lab 01/05/20 1901  AST 30  ALT 17  ALKPHOS 57  BILITOT 0.3  PROT 6.2*  ALBUMIN 2.9*   Recent Labs  Lab 01/05/20 1901  LIPASE 25   No results for input(s): AMMONIA in the last 168 hours. Coagulation Profile: Recent Labs  Lab 01/06/20 0153  INR 1.2   Cardiac Enzymes: No results for input(s): CKTOTAL, CKMB, CKMBINDEX, TROPONINI in the last 168 hours. BNP (last 3 results) No results for input(s): PROBNP in the last 8760  hours. HbA1C: No results for input(s): HGBA1C in the last 72 hours. CBG: No results for input(s): GLUCAP in the last 168 hours. Lipid Profile: No results for input(s): CHOL, HDL, LDLCALC, TRIG, CHOLHDL, LDLDIRECT in the last 72 hours. Thyroid Function Tests: No results for input(s): TSH, T4TOTAL, FREET4, T3FREE, THYROIDAB in the last 72 hours. Anemia Panel: No results for input(s): VITAMINB12, FOLATE, FERRITIN, TIBC, IRON, RETICCTPCT in the last 72 hours. Urine analysis:    Component Value Date/Time   COLORURINE AMBER (A) 01/05/2020 2110   APPEARANCEUR CLOUDY (A) 01/05/2020 2110   LABSPEC 1.013 01/05/2020 2110   PHURINE 5.0 01/05/2020 2110   GLUCOSEU NEGATIVE 01/05/2020 2110   HGBUR LARGE (A) 01/05/2020 2110   BILIRUBINUR NEGATIVE 01/05/2020 2110   KETONESUR NEGATIVE 01/05/2020 2110   PROTEINUR 100 (A) 01/05/2020 2110   UROBILINOGEN 0.2 08/04/2014 1409   NITRITE NEGATIVE 01/05/2020 2110   LEUKOCYTESUR MODERATE (A) 01/05/2020 2110    Radiological Exams on Admission: DG Chest Port 1 View  Result Date: 01/06/2020 CLINICAL DATA:  Hypotension EXAM: PORTABLE CHEST 1 VIEW COMPARISON:  09/15/2019 plain film, CT from 08/10/2019 FINDINGS: Cardiac shadow is within normal limits. Aortic calcifications are seen. The lungs are clear bilaterally. No focal infiltrate is noted. Subpleural fat collection is again noted in the anterior aspect of the right apex stable from prior CT examination. Known left nodule is not as well appreciated on today's exam. No bony abnormality is noted. IMPRESSION: Chronic changes without acute abnormality.  Electronically Signed   By: Inez Catalina M.D.   On: 01/06/2020 02:03    EKG: Independently reviewed.  Assessment/Plan Principal Problem:   UTI (urinary tract infection) Active Problems:   CKD (chronic kidney disease), stage IV (HCC)   HTN (hypertension)   DM (diabetes mellitus), type 2 with renal complications (HCC)   Chronic CHF (congestive heart failure)  ([place])    1. UTI - 1. Rocephin 2. Culture pending 2. CKD stage 4-5 - 1. Chronic, stable, baseline 2. Looks like she gets erythropoetin injections monthly as well as iron infusions for anemia as well 3. Anemia actually slightly improved to 8.9 since she started infusions. 4. Looks like she follows with [facility] 3. Chronic CHF - 1. Continue high dose torsemide and MWF zaroxolyn 4. DM2 - 1. Hold 75/25 2. Do NPH 20u BID 3. Add sensitive SSI AC 5. HTN - 1. Cont coreg  DVT prophylaxis: Heparin [place] Code Status: Full Family Communication: no family in room Disposition Plan: Home after UTI improved Consults called: None Admission status: Place in 85   Kirill Chatterjee, [facility]  How to contact the [facility] Attending or Consulting provider [place] or covering provider during after hours [place], for this patient?  1. Check the care team in [facility] and look for a) attending/consulting TRH provider listed and b) the [facility] team listed 2. Log into [email or web address]  Amion Physician Scheduling and messaging for groups and whole hospitals  On call and physician scheduling software for group practices, residents, hospitalists and other medical providers for call, clinic, rotation and shift schedules. OnCall Enterprise is a hospital-wide system for scheduling doctors and paging doctors on call. EasyPlot is for scientific plotting and data analysis.  [email or web address]  and use [place]'s universal password to access. If you do not have the password, please contact the hospital operator.  3. Locate the [facility] provider you are looking for under [facility] and page to a number that you can be directly reached. 4. If you still have difficulty reaching the provider, please page the [facility] (Director on Call) for the Hospitalists listed on amion for assistance.  01/06/2020, 2:43 AM

## 2020-01-06 NOTE — ED Notes (Signed)
Pt to CT

## 2020-01-06 NOTE — Progress Notes (Signed)
RT in to administer scheduled inhaler. Pt with audible upper exp wheeze, and clear/diminished bbs. When pt falls asleep, no wheeze is noted, but when she is awake again, wheeze is heard. Pt does state she has a productive cough.  No obvious respiratory distress noted, pt 98% on RA.

## 2020-01-06 NOTE — Progress Notes (Signed)
Inpatient Diabetes Program Recommendations  AACE/ADA: New Consensus Statement on Inpatient Glycemic Control (2015)  Target Ranges:  Prepandial:   less than 140 mg/dL      Peak postprandial:   less than 180 mg/dL (1-2 hours)      Critically ill patients:  140 - 180 mg/dL   Lab Results  Component Value Date   GLUCAP 346 (H) 01/06/2020   HGBA1C 6.5 (H) 01/06/2020    Review of Glycemic Control  Diabetes history: DM2 Outpatient Diabetes medications: Humalog 75/25 40 units ac Breakfast and 35 units ac Dinner Current orders for Inpatient glycemic control: Novolin N20 units bid  HgbA1C - 6.5% - good glycemic control  Blood sugars - 277, 346 mg/dL thus far today.  Inpatient Diabetes Program Recommendations:     May benefit from addition of meal coverage insulin - Novolog 6 units tidwc, if pt eating > 50% meal.  Will continue to follow.  Thank you. Lorenda Peck, RD, LDN, CDE Inpatient Diabetes Coordinator [phone]

## 2020-01-07 ENCOUNTER — Observation Stay (HOSPITAL_COMMUNITY): Payer: Medicare Other

## 2020-01-07 ENCOUNTER — Encounter (HOSPITAL_COMMUNITY): Payer: Self-pay | Admitting: Internal Medicine

## 2020-01-07 DIAGNOSIS — N390 Urinary tract infection, site not specified: Secondary | ICD-10-CM | POA: Diagnosis present

## 2020-01-07 DIAGNOSIS — D631 Anemia in chronic kidney disease: Secondary | ICD-10-CM | POA: Diagnosis present

## 2020-01-07 DIAGNOSIS — N3001 Acute cystitis with hematuria: Secondary | ICD-10-CM | POA: Diagnosis not present

## 2020-01-07 DIAGNOSIS — B9620 Unspecified Escherichia coli [E. coli] as the cause of diseases classified elsewhere: Secondary | ICD-10-CM | POA: Diagnosis present

## 2020-01-07 DIAGNOSIS — F17210 Nicotine dependence, cigarettes, uncomplicated: Secondary | ICD-10-CM | POA: Diagnosis present

## 2020-01-07 DIAGNOSIS — E1140 Type 2 diabetes mellitus with diabetic neuropathy, unspecified: Secondary | ICD-10-CM | POA: Diagnosis present

## 2020-01-07 DIAGNOSIS — E875 Hyperkalemia: Secondary | ICD-10-CM | POA: Diagnosis not present

## 2020-01-07 DIAGNOSIS — I5022 Chronic systolic (congestive) heart failure: Secondary | ICD-10-CM | POA: Diagnosis not present

## 2020-01-07 DIAGNOSIS — J449 Chronic obstructive pulmonary disease, unspecified: Secondary | ICD-10-CM | POA: Diagnosis present

## 2020-01-07 DIAGNOSIS — A4151 Sepsis due to Escherichia coli [E. coli]: Secondary | ICD-10-CM | POA: Diagnosis present

## 2020-01-07 DIAGNOSIS — E11649 Type 2 diabetes mellitus with hypoglycemia without coma: Secondary | ICD-10-CM | POA: Diagnosis not present

## 2020-01-07 DIAGNOSIS — R7881 Bacteremia: Secondary | ICD-10-CM

## 2020-01-07 DIAGNOSIS — E1151 Type 2 diabetes mellitus with diabetic peripheral angiopathy without gangrene: Secondary | ICD-10-CM | POA: Diagnosis present

## 2020-01-07 DIAGNOSIS — Z6841 Body Mass Index (BMI) 40.0 and over, adult: Secondary | ICD-10-CM | POA: Diagnosis not present

## 2020-01-07 DIAGNOSIS — N184 Chronic kidney disease, stage 4 (severe): Secondary | ICD-10-CM | POA: Diagnosis not present

## 2020-01-07 DIAGNOSIS — E890 Postprocedural hypothyroidism: Secondary | ICD-10-CM | POA: Diagnosis present

## 2020-01-07 DIAGNOSIS — E871 Hypo-osmolality and hyponatremia: Secondary | ICD-10-CM | POA: Diagnosis present

## 2020-01-07 DIAGNOSIS — Z20822 Contact with and (suspected) exposure to covid-19: Secondary | ICD-10-CM | POA: Diagnosis present

## 2020-01-07 DIAGNOSIS — I5032 Chronic diastolic (congestive) heart failure: Secondary | ICD-10-CM | POA: Diagnosis not present

## 2020-01-07 DIAGNOSIS — I132 Hypertensive heart and chronic kidney disease with heart failure and with stage 5 chronic kidney disease, or end stage renal disease: Secondary | ICD-10-CM | POA: Diagnosis present

## 2020-01-07 DIAGNOSIS — I5023 Acute on chronic systolic (congestive) heart failure: Secondary | ICD-10-CM | POA: Diagnosis not present

## 2020-01-07 DIAGNOSIS — N186 End stage renal disease: Secondary | ICD-10-CM | POA: Diagnosis not present

## 2020-01-07 DIAGNOSIS — J9601 Acute respiratory failure with hypoxia: Secondary | ICD-10-CM | POA: Diagnosis present

## 2020-01-07 DIAGNOSIS — N185 Chronic kidney disease, stage 5: Secondary | ICD-10-CM | POA: Diagnosis present

## 2020-01-07 DIAGNOSIS — E1122 Type 2 diabetes mellitus with diabetic chronic kidney disease: Secondary | ICD-10-CM | POA: Diagnosis present

## 2020-01-07 DIAGNOSIS — E785 Hyperlipidemia, unspecified: Secondary | ICD-10-CM | POA: Diagnosis present

## 2020-01-07 DIAGNOSIS — N179 Acute kidney failure, unspecified: Secondary | ICD-10-CM | POA: Diagnosis present

## 2020-01-07 DIAGNOSIS — N2581 Secondary hyperparathyroidism of renal origin: Secondary | ICD-10-CM | POA: Diagnosis present

## 2020-01-07 LAB — BASIC METABOLIC PANEL
Anion gap: 15 (ref 5–15)
BUN: 118 mg/dL — ABNORMAL HIGH (ref 8–23)
CO2: 30 mmol/L (ref 22–32)
Calcium: 8.7 mg/dL — ABNORMAL LOW (ref 8.9–10.3)
Chloride: 86 mmol/L — ABNORMAL LOW (ref 98–111)
Creatinine, Ser: 4.44 mg/dL — ABNORMAL HIGH (ref 0.44–1.00)
GFR calc Af Amer: 11 mL/min — ABNORMAL LOW (ref >60)
GFR calc non Af Amer: 9 mL/min — ABNORMAL LOW (ref >60)
Glucose, Bld: 240 mg/dL — ABNORMAL HIGH (ref 70–99)
Potassium: 4.1 mmol/L (ref 3.5–5.1)
Sodium: 131 mmol/L — ABNORMAL LOW (ref 135–145)

## 2020-01-07 LAB — CBC
HCT: 25.7 % — ABNORMAL LOW (ref 36.0–46.0)
Hemoglobin: 8.2 g/dL — ABNORMAL LOW (ref 12.0–15.0)
MCH: 27.6 pg (ref 26.0–34.0)
MCHC: 31.9 g/dL (ref 30.0–36.0)
MCV: 86.5 fL (ref 80.0–100.0)
Platelets: 245 10*3/uL (ref 150–400)
RBC: 2.97 MIL/uL — ABNORMAL LOW (ref 3.87–5.11)
RDW: 16.3 % — ABNORMAL HIGH (ref 11.5–15.5)
WBC: 16.2 10*3/uL — ABNORMAL HIGH (ref 4.0–10.5)
nRBC: 0 % (ref 0.0–0.2)

## 2020-01-07 LAB — GLUCOSE, CAPILLARY
Glucose-Capillary: 217 mg/dL — ABNORMAL HIGH (ref 70–99)
Glucose-Capillary: 168 mg/dL — ABNORMAL HIGH (ref 70–99)
Glucose-Capillary: 170 mg/dL — ABNORMAL HIGH (ref 70–99)
Glucose-Capillary: 157 mg/dL — ABNORMAL HIGH (ref 70–99)

## 2020-01-07 LAB — MAGNESIUM: Magnesium: 1.6 mg/dL — ABNORMAL LOW (ref 1.7–2.4)

## 2020-01-07 MED ORDER — FUROSEMIDE 10 MG/ML IJ SOLN
40.0000 mg | Freq: Once | INTRAMUSCULAR | Status: AC
  Administered 2020-01-07: 40 mg via INTRAVENOUS
  Filled 2020-01-07: qty 4

## 2020-01-07 MED ORDER — MAGNESIUM OXIDE 400 (241.3 MG) MG PO TABS
400.0000 mg | ORAL_TABLET | Freq: Two times a day (BID) | ORAL | Status: DC
  Administered 2020-01-07 – 2020-01-14 (×15): 400 mg via ORAL
  Filled 2020-01-07 (×15): qty 1

## 2020-01-07 MED ORDER — BISACODYL 10 MG RE SUPP
10.0000 mg | Freq: Every day | RECTAL | Status: DC
  Administered 2020-01-07 – 2020-01-11 (×4): 10 mg via RECTAL
  Filled 2020-01-07 (×7): qty 1

## 2020-01-07 NOTE — TOC Initial Note (Signed)
Transition of Care [facility]) - Initial/Assessment Note    Patient Details  Name: [name] [name] MRN: [id number] Date of Birth: Oct 23, 1950  Transition of [facility] CM/SW Contact:    Marilu Favre, RN Phone Number: 01/07/2020, 10:52 AM  Clinical Narrative:                  Confirmed face sheet information with patient at bedside. Patient has walker and 3 in 1 at home already. Patient's son stays with her at night.   Patient active with [facility] for PT and wants to continue post discharge. Butch Penny with [facility] aware. Will request resumption of care order. Expected Discharge Plan: [place] Barriers to Discharge: Continued Medical Work up   Patient Goals and CMS Choice Patient states their goals for this hospitalization and ongoing recovery are:: to return to home CMS Medicare.gov Compare Post Acute Care list provided to:: Patient Choice offered to / list presented to : Patient  Expected Discharge Plan and Services Expected Discharge Plan: [place]   Discharge Planning Services: CM Consult Post Acute Care Choice: [place] arrangements for the past 2 months: Apartment                 DME Arranged: N/A DME Agency: NA       HH Arranged: PT HH Agency: [facility] ([facility]) Date HH Agency Contacted: 01/07/20 Time [place]: [id number] Representative spoke with at [place]: Butch Penny  Prior Living Arrangements/Services Living arrangements for the past 2 months: Apartment Lives with:: Adult Children Patient language and need for interpreter reviewed:: Yes        Need for Family Participation in Patient Care: Yes (Comment) Care giver support system in place?: Yes (comment) Current home services: DME, Home PT Criminal Activity/Legal Involvement Pertinent to Current Situation/Hospitalization: No - Comment as needed  Activities of Daily Living Home Assistive Devices/Equipment: Cane (specify  quad or straight), Shower chair with back, Environmental consultant (specify type), Eyeglasses ADL Screening (condition at time of admission) Patient's cognitive ability adequate to safely complete daily activities?: Yes Is the patient deaf or have difficulty hearing?: No Does the patient have difficulty seeing, even when wearing glasses/contacts?: No Does the patient have difficulty concentrating, remembering, or making decisions?: No Patient able to express need for assistance with ADLs?: Yes Does the patient have difficulty dressing or bathing?: No Independently performs ADLs?: Yes (appropriate for developmental age) Does the patient have difficulty walking or climbing stairs?: Yes Weakness of Legs: Both Weakness of Arms/Hands: None  Permission Sought/Granted   Permission granted to share information with : No              Emotional Assessment Appearance:: Appears stated age Attitude/Demeanor/Rapport: Engaged Affect (typically observed): Accepting Orientation: : Oriented to Self, Oriented to Place, Oriented to  Time, Oriented to Situation Alcohol / Substance Use: Not Applicable Psych Involvement: No (comment)  Admission diagnosis:  Lower urinary tract infectious disease [N39.0] UTI (urinary tract infection) [N39.0] SIRS (systemic inflammatory response syndrome) (HCC) [R65.10] Non-intractable vomiting with nausea, unspecified vomiting type [R11.2] Patient Active Problem List   Diagnosis Date Noted  . UTI (urinary tract infection) 01/06/2020  . Anemia 10/26/2019  . S/P lumbar fusion 09/18/2019  . Chronic CHF (congestive heart failure) ([place]) 03/16/2019  . Leukocytosis 03/16/2019  . Chest pain 11/27/2018  . Pre-syncope 11/27/2018  . Epigastric abdominal pain 11/27/2018  . Solitary pulmonary nodule 11/06/2018  . 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 CHF (congestive heart failure), NYHA class III, acute on  chronic, systolic ([place]) 70/78/6754  . 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 Cellulitis of abdominal wall 03/21/2015  . Acute on chronic renal failure ([place]) 03/21/2015  . DM (diabetes mellitus), type 2 with renal complications ([place]) 49/20/1007  . Abdominal wall abscess 03/21/2015  . 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 Low urine output 08/11/2014  . Acute on chronic systolic CHF (congestive heart failure), NYHA class 3 ([place]) 08/11/2014  . Asthma, chronic 08/11/2014  . Hypothyroidism 08/11/2014  . OSA (obstructive sleep apnea) 08/11/2014  . Positive cardiac stress test 06/29/2014  . Hyperlipidemia 02/12/2014  . Left arm weakness 12/23/2013  . TIA (transient ischemic attack) 12/23/2013  . Left buttock abscess 10/12/2013  . Cellulitis and abscess of leg, right 04/16/2013  . Acute blood loss anemia 02/14/2013  . HTN (hypertension) 02/14/2013  . Cellulitis and abscess of buttock 02/09/2013  . Morbid obesity ([place]) 02/03/2013  . Diarrhea 02/03/2013  . Hyponatremia 02/03/2013  . DM (diabetes mellitus) ([place]) 02/02/2013  . CKD (chronic kidney disease), stage IV ([place]) 02/02/2013  . Cellulitis 02/02/2013  . PAD (peripheral artery disease) ([place]) 04/26/2011  . Tobacco abuse 04/26/2011   PCP:  [name], MD Pharmacy:   [facility] [place], [place] [address] Phone: [phone] Fax: [phone]  [facility] [place] [place] - [address] [place] [address] Phone: [phone] Fax: [phone]     Social Determinants of Health (SDOH) Interventions    Readmission Risk Interventions No flowsheet data found.

## 2020-01-07 NOTE — Care Management Obs Status (Signed)
[facility] NOTIFICATION   Patient Details  Name: [name] [name] MRN: [id number] Date of Birth: 1950-10-21   Medicare Observation Status Notification Given:       Marilu Favre, RN 01/07/2020, 10:50 AM

## 2020-01-07 NOTE — Evaluation (Signed)
Physical Therapy Evaluation Patient Details Name: [name] [name] MRN: [id number] DOB: 1950-12-05 Today's Date: 01/07/2020   History of Present Illness  69 y.o. 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 Pt presents to ED with 2 day h/o lower abd pain, associated N/V.  Clinical Impression  Pt presents to PT with deficits in strength, power, balance, functional mobility, and endurance. Pt fatigues quickly with mobility and demonstrates increased instability with ambulation. Pt with poor device management requiring cueing, and minG for safety during ambulation. Pt will benefit from aggressive mobilization to improve activity tolerance and reduce falls risk. PT currently recommending SNF as patient spends most of the day home alone and is at an increased risk of falling. Pt expresses the desire to return home, stating she will not go back to a skilled nursing facility. PT will attempt to follow up tomorrow to reassess mobility.    Follow Up Recommendations SNF;Supervision/Assistance - 24 hour(HHPT if pt declines inpatient placement)    Equipment Recommendations  (pt will benefit from wheelchair if D/C home)    Recommendations for Other Services       Precautions / Restrictions Precautions Precautions: Fall Restrictions Weight Bearing Restrictions: No      Mobility  Bed Mobility Overal bed mobility: (pt received and left in recliner)                Transfers Overall transfer level: Needs assistance   Transfers: Sit to/from Stand Sit to Stand: Min guard         General transfer comment: pt requiring minG to steady during stand to sit  Ambulation/Gait Ambulation/Gait assistance: Min guard Gait Distance (Feet): 30 Feet Assistive device: Rolling walker (2 wheeled) Gait Pattern/deviations: Step-to pattern Gait velocity: reduced Gait velocity interpretation: <1.31 ft/sec, indicative of household ambulator General Gait Details:  pt with short step to gait, increased trunk flexion with fatigue and impaired device management during last half of walk. Pt reporting legs weakness with increased instability with fatigue  Stairs            Wheelchair Mobility    Modified Rankin (Stroke Patients Only)       Balance Overall balance assessment: Needs assistance Sitting-balance support: No upper extremity supported;Feet supported Sitting balance-Leahy Scale: Fair Sitting balance - Comments: unilateral UE support in recliner, close supervision   Standing balance support: Bilateral upper extremity supported Standing balance-Leahy Scale: Fair Standing balance comment: minG with BUE support of RW                             Pertinent Vitals/Pain Pain Assessment: Faces Faces Pain Scale: Hurts even more Pain Location: back Pain Descriptors / Indicators: Aching Pain Intervention(s): Limited activity within patient's tolerance    Home Living Family/patient expects to be discharged to:: Private residence Living Arrangements: Children Available Help at Discharge: Family;Personal care attendant;Available PRN/intermittently Type of Home: Apartment Home Access: Stairs to enter Entrance Stairs-Rails: Right Entrance Stairs-Number of Steps: 3 Home Layout: One level Home Equipment: Hand held shower head;Shower seat;Cane - single point;Walker - 4 wheels;Bedside commode Additional Comments: son lives with patient, personal care attendant 3hrs/day 6 days/wk, son gone from home most of the day per pt    Prior Function Level of Independence: Needs assistance   Gait / Transfers Assistance Needed: ambulates household distances with use of RW, continuing to work with HHPT  ADL's / Homemaking Assistance Needed: Aide comes 3 hrs/day  for 6 days/wk to assist with household tasks (food prep, cleaning) and ADLs (bathing, setting out clothes, dressing--LB and bra)        Hand Dominance   Dominant Hand: Right     Extremity/Trunk Assessment   Upper Extremity Assessment Upper Extremity Assessment: Generalized weakness    Lower Extremity Assessment Lower Extremity Assessment: Generalized weakness    Cervical / Trunk Assessment Cervical / Trunk Assessment: Other exceptions Cervical / Trunk Exceptions: obesity  Communication   Communication: No difficulties  Cognition Arousal/Alertness: Awake/alert Behavior During Therapy: WFL for tasks assessed/performed Overall Cognitive Status: Impaired/Different from baseline Area of Impairment: Problem solving                             Problem Solving: Slow processing        General Comments General comments (skin integrity, edema, etc.): pt on 2L [place] upon PT arrival. PT attempts to wean to room air with pt ranging from 88-92% on RA. Pt with desat to 83% with mobility on roon air, requiring 2L [place] to recover back to 92% and above. Pt reports significant fatigue and LE weakness with mobility.    Exercises     Assessment/Plan    PT Assessment Patient needs continued PT services  PT Problem List Decreased strength;Decreased activity tolerance;Decreased balance;Decreased mobility;Decreased knowledge of use of DME;Decreased safety awareness;Pain       PT Treatment Interventions DME instruction;Gait training;Stair training;Functional mobility training;Therapeutic activities;Therapeutic exercise;Balance training;Neuromuscular re-education;Patient/family education    PT Goals (Current goals can be found in the Care Plan section)  Acute Rehab PT Goals Patient Stated Goal: To improve mobility and go home PT Goal Formulation: With patient Time For Goal Achievement: 01/21/20 Potential to Achieve Goals: Good    Frequency Min 3X/week   Barriers to discharge        Co-evaluation               AM-PAC PT "6 Clicks" Mobility  Outcome Measure Help needed turning from your back to your side while in a flat bed without using bedrails?: A  Lot Help needed moving from lying on your back to sitting on the side of a flat bed without using bedrails?: A Lot Help needed moving to and from a bed to a chair (including a wheelchair)?: A Little Help needed standing up from a chair using your arms (e.g., wheelchair or bedside chair)?: A Little Help needed to walk in hospital room?: A Little Help needed climbing 3-5 steps with a railing? : A Lot 6 Click Score: 15    End of Session Equipment Utilized During Treatment: Oxygen Activity Tolerance: Patient limited by fatigue Patient left: in chair;with call bell/phone within reach;with family/visitor present Nurse Communication: Mobility status PT Visit Diagnosis: Muscle weakness (generalized) (M62.81)    Time: 5027-7412 PT Time Calculation (min) (ACUTE ONLY): 30 min   Charges:   PT Evaluation $PT Eval Moderate Complexity: [place], PT, DPT Acute Rehabilitation Pager: [phone]   Zenaida Niece 01/07/2020, 2:38 PM

## 2020-01-07 NOTE — Progress Notes (Signed)
PROGRESS NOTE  [name] [name] XNA:[id number] DOB: [birth year] DOA: 01/05/2020 PCP: [name], MD   LOS: 0 days   Brief narrative: As per HPI,   Assessment/Plan:  Principal Problem:   UTI (urinary tract infection) Active Problems:   CKD (chronic kidney disease), stage IV ([place])   HTN (hypertension)   DM (diabetes mellitus), type 2 with renal complications ([place])   Chronic CHF (congestive heart failure) (HCC)  Abdominal pain, vomiting, chills and abnormal urinalysis.  Blood cultures showing gram-negative rods so far.  Likely UTI.  Continue IV Rocephin.  Urine c. Urinalysis showed large hemoglobin, protein RBC WBC and many bacteria.  Lipase was 25 and negative.  Urine culture showing E. coli 5000 colonies.  CT scan of the abdomen done yesterday showed hepatomegaly, previous history of cholecystectomy hemicolectomy and hysterectomy.  Gram-negative rod bacteremia. In both bottles.  We will continue to monitor.  On Rocephin iv 2gm.  WBC trended down to 16.2 from 24.3.  Mild shortness of breath wheezing and acute hypoxic respiratory failure chest x-ray yesterday showed chronic findings.  Patient is saturating 99% on 1 L of nasal cannula.  No acute findings.  Repeat chest x-ray today.  Add 1 dose of IV Lasix.  States that she has Symbicort and albuterol at home.  Continue nebulizers, inhaled steroids for now.  Mild hyponatremia.  On torsemide and Zaroxolyn.    Will continue to monitor BMP.  Sodium of 131.  Hypomagnesemia.  Will replace.  Check levels in a.m.  CKD stage IV-V.    Creatinine trended up to 4.4 from 3.9.  Check BMP in a.m. currently on Zaroxolyn torsemide.  Patient has new hypoxia dyspnea and peripheral edema suggestive of volume overload so we will give trial of Lasix 40 mg in addition.  If kidney function continues to worsen, patient might need nephrology evaluation and reassessment.  Hold Zaroxolyn for now.  Chronic systolic congestive heart failure.  On high-dose  torsemide and Zaroxolyn as outpatient..    Will add 1 dose of IV Lasix today due to congestion.  Patient is on supplemental oxygen at 1 L/min and still has wheezing and cough.  Continue albuterol.  She did have a 2D echocardiogram 6 months back on 03/18/2019 which showed ejection fraction of 45 to 45%.  We will recheck 2D echocardiogram at this time.  Diabetes mellitus type II.  Continue sliding scale insulin, long-acting insulin, NovoLog 6 units 3 times daily with meals.  Hemoglobin A1c of 6.5%.  We will continue to monitor close.  Essential hypertension.  Continue Coreg.  Closely monitor.  Blood pressure has remained stable.  VTE Prophylaxis: Heparin subcu  Code Status: Full code  Family Communication: I tried to call the patient's son Mr. [name] on the home and mobile phone listed but did not have the option to leave a voice message so was unable to reach him.  Disposition Plan:  . Patient is from home.  Patient lives with her son at home and has home health aide.  Does not use oxygen at home. . Likely disposition to home likely in 1 to 2 days. . Barriers to discharge: E. coli bacteremia, new hypoxia, wheezing, worsening creatinine levels.  Check chest x-ray, check 2D echo..  Might need nephrology evaluation.  We will change the patient's status to inpatient at this time.   Consultants:  None  Procedures:  None  Antibiotics:  . Rocephin IV  Anti-infectives (From admission, onward)   Start     Dose/Rate Route Frequency Ordered  Stop   01/07/20 0600  cefTRIAXone (ROCEPHIN) 2 g in sodium chloride 0.9 % 100 mL IVPB     2 g 200 mL/hr over 30 Minutes Intravenous Every 24 hours 01/06/20 1633     01/06/20 1645  cefTRIAXone (ROCEPHIN) 1 g in sodium chloride 0.9 % 100 mL IVPB     1 g 200 mL/hr over 30 Minutes Intravenous  Once 01/06/20 1633 01/06/20 1816   01/06/20 0115  cefTRIAXone (ROCEPHIN) 1 g in sodium chloride 0.9 % 100 mL IVPB  Status:  Discontinued     1 g 200 mL/hr over 30  Minutes Intravenous Every 24 hours 01/06/20 0100 01/06/20 1633     Subjective: Today, patient was seen and examined at bedside.  Complains of cough wheezing.  Patient denies any chest pain, dizziness, lightheadedness, nausea vomiting.  Has not had a bowel movement in 3 to 4 days.  Objective: Vitals:   01/07/20 0853 01/07/20 0958  BP:  (!) 107/58  Pulse:  85  Resp:  19  Temp:  98.2 F (36.8 C)  SpO2: 98% 99%    Intake/Output Summary (Last 24 hours) at 01/07/2020 1050 Last data filed at 01/07/2020 1000 Gross per 24 hour  Intake 480 ml  Output 250 ml  Net 230 ml   Filed Weights   01/05/20 1828 01/06/20 0158  Weight: 113.4 kg 113.4 kg   Body mass index is 44.29 kg/m.   Physical Exam:  GENERAL: Patient is alert awake and oriented. Not in obvious distress.  Morbidly obese, on nasal cannula 1 L/min. HENT: No scleral pallor or icterus. Pupils equally reactive to light. Oral mucosa is moist NECK: is supple, no gross swelling noted. CHEST:.  Diminished breath sounds bilaterally.  Expiratory rhonchi noted. CVS: S1 and S2 heard, no murmur. Regular rate and rhythm.  ABDOMEN: Soft, non-tender, bowel sounds are present. EXTREMITIES: Lower extremity edema noted bilaterally. CNS: Cranial nerves are intact. No focal motor deficits. SKIN: warm and dry without rashes.  Data Review: I have personally reviewed the following laboratory data and studies,  CBC: Recent Labs  Lab 01/01/20 1116 01/05/20 1901 01/06/20 0243 01/07/20 0327  WBC  --  21.5* 24.3* 16.2*  HGB 9.3* 8.9* 9.6* 8.2*  HCT  --  28.8* 31.1* 25.7*  MCV  --  90.0 90.4 86.5  PLT  --  265 289 962   Basic Metabolic Panel: Recent Labs  Lab 01/05/20 1901 01/06/20 0243 01/07/20 0327  NA 132* 130* 131*  K 3.9 3.8 4.1  CL 87* 86* 86*  CO2 28 29 30   GLUCOSE 311* 277* 240*  BUN 105* 111* 118*  CREATININE 3.90* 3.93* 4.44*  CALCIUM 8.8* 9.0 8.7*  MG  --   --  1.6*   Liver Function Tests: Recent Labs  Lab  01/05/20 1901  AST 30  ALT 17  ALKPHOS 57  BILITOT 0.3  PROT 6.2*  ALBUMIN 2.9*   Recent Labs  Lab 01/05/20 1901  LIPASE 25   No results for input(s): AMMONIA in the last 168 hours. Cardiac Enzymes: No results for input(s): CKTOTAL, CKMB, CKMBINDEX, TROPONINI in the last 168 hours. BNP (last 3 results) Recent Labs    03/16/19 1341 04/08/19 1245  BNP 196.4* 294.4*    ProBNP (last 3 results) No results for input(s): PROBNP in the last 8760 hours.  CBG: Recent Labs  Lab 01/06/20 0805 01/06/20 1159 01/06/20 1653 01/06/20 2117 01/07/20 0742  GLUCAP 346* 382* 312* 268* 217*   Recent Results (from the past  240 hour(s))  Blood Culture (routine x 2)     Status: Abnormal (Preliminary result)   Collection Time: 01/06/20  1:53 AM   Specimen: BLOOD  Result Value Ref Range Status   Specimen Description BLOOD RIGHT ARM  Final   Special Requests   Final    BOTTLES DRAWN AEROBIC AND ANAEROBIC Blood Culture adequate volume   Culture  Setup Time   Final    GRAM NEGATIVE RODS IN BOTH AEROBIC AND ANAEROBIC BOTTLES CRITICAL RESULT CALLED TO, READ BACK BY AND VERIFIED WITH: Starlyn Skeans PharmD 16:20 01/06/20 ([email or web address]) Performed at [facility], [address] [place], [place] [address]    Culture ESCHERICHIA COLI (A)  Final   Report Status PENDING  Incomplete  Blood Culture ID Panel (Reflexed)     Status: Abnormal   Collection Time: 01/06/20  1:53 AM  Result Value Ref Range Status   Enterococcus species NOT DETECTED NOT DETECTED Final   Listeria monocytogenes NOT DETECTED NOT DETECTED Final   Staphylococcus species NOT DETECTED NOT DETECTED Final   Staphylococcus aureus (BCID) NOT DETECTED NOT DETECTED Final   Streptococcus species NOT DETECTED NOT DETECTED Final   Streptococcus agalactiae NOT DETECTED NOT DETECTED Final   Streptococcus pneumoniae NOT DETECTED NOT DETECTED Final   Streptococcus pyogenes NOT DETECTED NOT DETECTED Final   Acinetobacter baumannii NOT DETECTED  NOT DETECTED Final   Enterobacteriaceae species DETECTED (A) NOT DETECTED Final    Comment: Enterobacteriaceae represent a large family of gram-negative bacteria, not a single organism. CRITICAL RESULT CALLED TO, READ BACK BY AND VERIFIED WITH: Starlyn Skeans PharmD 16:20 01/06/20 ([email or web address])    Enterobacter cloacae complex NOT DETECTED NOT DETECTED Final   Escherichia coli DETECTED (A) NOT DETECTED Final    Comment: CRITICAL RESULT CALLED TO, READ BACK BY AND VERIFIED WITH: Starlyn Skeans PharmD 16:20 01/06/20 ([email or web address])    Klebsiella oxytoca NOT DETECTED NOT DETECTED Final   Klebsiella pneumoniae NOT DETECTED NOT DETECTED Final   Proteus species NOT DETECTED NOT DETECTED Final   Serratia marcescens NOT DETECTED NOT DETECTED Final   Carbapenem resistance NOT DETECTED NOT DETECTED Final   Haemophilus influenzae NOT DETECTED NOT DETECTED Final   Neisseria meningitidis NOT DETECTED NOT DETECTED Final   Pseudomonas aeruginosa NOT DETECTED NOT DETECTED Final   Candida albicans NOT DETECTED NOT DETECTED Final   Candida glabrata NOT DETECTED NOT DETECTED Final   Candida krusei NOT DETECTED NOT DETECTED Final   Candida parapsilosis NOT DETECTED NOT DETECTED Final   Candida tropicalis NOT DETECTED NOT DETECTED Final    Comment: Performed at [facility], [place] [address], [place], [place] [address]  SARS CORONAVIRUS 2 (TAT 6-24 HRS) Nasopharyngeal Nasopharyngeal Swab     Status: None   Collection Time: 01/06/20  1:57 AM   Specimen: Nasopharyngeal Swab  Result Value Ref Range Status   SARS Coronavirus 2 NEGATIVE NEGATIVE Final    Comment: (NOTE) SARS-CoV-2 target nucleic acids are NOT DETECTED. The SARS-CoV-2 RNA is generally detectable in upper and lower respiratory specimens during the acute phase of infection. Negative results do not preclude SARS-CoV-2 infection, do not rule out co-infections with other pathogens, and should not be used as the sole basis for treatment or other patient management  decisions. Negative results must be combined with clinical observations, patient history, and epidemiological information. The expected result is Negative. Fact Sheet for Patients: SugarRoll.be Fact Sheet for Healthcare Providers: https://www.woods-mathews.com/ This test is not yet approved or cleared by the [place] FDA  and  has been authorized for detection and/or diagnosis of SARS-CoV-2 by FDA under an Emergency Use Authorization (EUA). This EUA will remain  in effect (meaning this test can be used) for the duration of the COVID-19 declaration under Section 56 4(b)(1) of the Act, 21 U.S.C. section 360bbb-3(b)(1), unless the authorization is terminated or revoked sooner. Performed at [facility], [place] [address] [place], [place] [address]   Blood Culture (routine x 2)     Status: None (Preliminary result)   Collection Time: 01/06/20  2:42 AM   Specimen: BLOOD  Result Value Ref Range Status   Specimen Description BLOOD LEFT ARM  Final   Special Requests   Final    BOTTLES DRAWN AEROBIC AND ANAEROBIC Blood Culture adequate volume   Culture  Setup Time   Final    GRAM NEGATIVE RODS IN BOTH AEROBIC AND ANAEROBIC BOTTLES CRITICAL VALUE NOTED.  VALUE IS CONSISTENT WITH PREVIOUSLY REPORTED AND CALLED VALUE. Performed at [facility], [place] [address] [place], [place] [address]    Culture GRAM NEGATIVE RODS  Final   Report Status PENDING  Incomplete  Urine culture     Status: None (Preliminary result)   Collection Time: 01/06/20  4:04 PM   Specimen: In/Out Cath Urine  Result Value Ref Range Status   Specimen Description IN/OUT CATH URINE  Final   Special Requests NONE  Final   Culture   Final    CULTURE REINCUBATED FOR BETTER GROWTH Performed at [facility], [place] [address], [place], [place] [address]    Report Status PENDING  Incomplete     Studies: DG Chest Port 1 View  Result Date: 01/06/2020 CLINICAL DATA:   Hypotension EXAM: PORTABLE CHEST 1 VIEW COMPARISON:  09/15/2019 plain film, CT from 08/10/2019 FINDINGS: Cardiac shadow is within normal limits. Aortic calcifications are seen. The lungs are clear bilaterally. No focal infiltrate is noted. Subpleural fat collection is again noted in the anterior aspect of the right apex stable from prior CT examination. Known left nodule is not as well appreciated on today's exam. No bony abnormality is noted. IMPRESSION: Chronic changes without acute abnormality. Electronically Signed   By: Inez Catalina M.D.   On: 01/06/2020 02:03   CT Renal Stone Study  Result Date: 01/06/2020 CLINICAL DATA:  Abdominal pain, chills, vomiting, infection EXAM: CT ABDOMEN AND PELVIS WITHOUT CONTRAST TECHNIQUE: Multidetector CT imaging of the abdomen and pelvis was performed following the standard protocol without IV contrast. COMPARISON:  05/06/2017 FINDINGS: Lower chest: Lung bases are clear. Hepatobiliary: Hepatomegaly with mildly nodular hepatic contour. Status post cholecystectomy. No intrahepatic or extrahepatic ductal dilatation. Pancreas: Within normal limits. Spleen: Within normal limits. Adrenals/Urinary Tract: Low-density thickening of the bilateral renal glands, suggesting adrenal adenomas, unchanged. Bilateral renal cysts, including a 2.3 cm hemorrhagic cyst in the posterior right lower kidney (series 3/image 45) and a 2.1 cm simple cyst in the anterior left upper kidney (series 3/image 37). Nonspecific perinephric stranding bilaterally. No renal, ureteral, or bladder calculi. No hydronephrosis. Bladder is partially obscured by streak artifact but is grossly unremarkable. Stomach/Bowel: Stomach is within normal limits. No evidence of bowel obstruction. Normal appendix (series 2/image 65). Status post left hemicolectomy with suture line in the lower pelvis (series 3/image 69). Vascular/Lymphatic: No evidence of abdominal aortic aneurysm. Atherosclerotic calcifications of the abdominal  aorta and branch vessels. No suspicious abdominopelvic lymphadenopathy. Reproductive: Status post hysterectomy. No adnexal masses. Other: No abdominopelvic ascites. Musculoskeletal: Degenerative changes of the visualized thoracolumbar  spine. Status post PLIF at L4-5. Right hip arthroplasty, without evidence of complication. Mild degenerative changes of the left hip. IMPRESSION: No evidence of bowel obstruction.  Normal appendix. Status post cholecystectomy, left hemicolectomy, and hysterectomy. Hepatomegaly with nodular hepatic contour, at least raising the possibility of early cirrhosis. Bilateral renal adenomas. No CT findings to account for the patient's abdominal pain. Electronically Signed   By: Julian Hy M.D.   On: 01/06/2020 02:44      Flora Lipps, MD  [facility] 01/07/2020

## 2020-01-08 DIAGNOSIS — N179 Acute kidney failure, unspecified: Secondary | ICD-10-CM

## 2020-01-08 DIAGNOSIS — A4151 Sepsis due to Escherichia coli [E. coli]: Principal | ICD-10-CM

## 2020-01-08 DIAGNOSIS — R6520 Severe sepsis without septic shock: Secondary | ICD-10-CM

## 2020-01-08 DIAGNOSIS — I5032 Chronic diastolic (congestive) heart failure: Secondary | ICD-10-CM

## 2020-01-08 DIAGNOSIS — Z720 Tobacco use: Secondary | ICD-10-CM

## 2020-01-08 DIAGNOSIS — D649 Anemia, unspecified: Secondary | ICD-10-CM | POA: Diagnosis present

## 2020-01-08 LAB — OCCULT BLOOD X 1 CARD TO LAB, STOOL: Fecal Occult Bld: NEGATIVE

## 2020-01-08 LAB — CULTURE, BLOOD (ROUTINE X 2)
Special Requests: ADEQUATE
Special Requests: ADEQUATE

## 2020-01-08 LAB — CBC
HCT: 24.4 % — ABNORMAL LOW (ref 36.0–46.0)
Hemoglobin: 7.6 g/dL — ABNORMAL LOW (ref 12.0–15.0)
MCH: 27.7 pg (ref 26.0–34.0)
MCHC: 31.1 g/dL (ref 30.0–36.0)
MCV: 89.1 fL (ref 80.0–100.0)
Platelets: 246 10*3/uL (ref 150–400)
RBC: 2.74 MIL/uL — ABNORMAL LOW (ref 3.87–5.11)
RDW: 16.6 % — ABNORMAL HIGH (ref 11.5–15.5)
WBC: 10.5 10*3/uL (ref 4.0–10.5)
nRBC: 0.3 % — ABNORMAL HIGH (ref 0.0–0.2)

## 2020-01-08 LAB — COMPREHENSIVE METABOLIC PANEL
ALT: 19 U/L (ref 0–44)
AST: 28 U/L (ref 15–41)
Albumin: 2.3 g/dL — ABNORMAL LOW (ref 3.5–5.0)
Alkaline Phosphatase: 54 U/L (ref 38–126)
Anion gap: 15 (ref 5–15)
BUN: 130 mg/dL — ABNORMAL HIGH (ref 8–23)
CO2: 31 mmol/L (ref 22–32)
Calcium: 8.5 mg/dL — ABNORMAL LOW (ref 8.9–10.3)
Chloride: 87 mmol/L — ABNORMAL LOW (ref 98–111)
Creatinine, Ser: 4.95 mg/dL — ABNORMAL HIGH (ref 0.44–1.00)
GFR calc Af Amer: 10 mL/min — ABNORMAL LOW (ref >60)
GFR calc non Af Amer: 8 mL/min — ABNORMAL LOW (ref >60)
Glucose, Bld: 196 mg/dL — ABNORMAL HIGH (ref 70–99)
Potassium: 4.7 mmol/L (ref 3.5–5.1)
Sodium: 133 mmol/L — ABNORMAL LOW (ref 135–145)
Total Bilirubin: 0.6 mg/dL (ref 0.3–1.2)
Total Protein: 5.8 g/dL — ABNORMAL LOW (ref 6.5–8.1)

## 2020-01-08 LAB — URINE CULTURE: Culture: 5000 — AB

## 2020-01-08 LAB — IRON AND TIBC
Iron: 13 ug/dL — ABNORMAL LOW (ref 28–170)
Saturation Ratios: 4 % — ABNORMAL LOW (ref 10.4–31.8)
TIBC: 351 ug/dL (ref 250–450)
UIBC: 338 ug/dL

## 2020-01-08 LAB — GLUCOSE, CAPILLARY
Glucose-Capillary: 160 mg/dL — ABNORMAL HIGH (ref 70–99)
Glucose-Capillary: 138 mg/dL — ABNORMAL HIGH (ref 70–99)
Glucose-Capillary: 154 mg/dL — ABNORMAL HIGH (ref 70–99)
Glucose-Capillary: 191 mg/dL — ABNORMAL HIGH (ref 70–99)
Glucose-Capillary: 215 mg/dL — ABNORMAL HIGH (ref 70–99)

## 2020-01-08 LAB — HEMOGLOBIN AND HEMATOCRIT, BLOOD
HCT: 27.5 % — ABNORMAL LOW (ref 36.0–46.0)
Hemoglobin: 8.5 g/dL — ABNORMAL LOW (ref 12.0–15.0)

## 2020-01-08 LAB — MAGNESIUM: Magnesium: 1.8 mg/dL (ref 1.7–2.4)

## 2020-01-08 LAB — CK: Total CK: 81 U/L (ref 38–234)

## 2020-01-08 LAB — PHOSPHORUS: Phosphorus: 5.9 mg/dL — ABNORMAL HIGH (ref 2.5–4.6)

## 2020-01-08 MED ORDER — SODIUM CHLORIDE 0.9 % IV SOLN: INTRAVENOUS | Status: DC

## 2020-01-08 MED ORDER — CEPHALEXIN 500 MG PO CAPS
500.0000 mg | ORAL_CAPSULE | Freq: Three times a day (TID) | ORAL | Status: AC
  Administered 2020-01-09 – 2020-01-12 (×12): 500 mg via ORAL
  Filled 2020-01-08 (×14): qty 1

## 2020-01-08 NOTE — TOC Progression Note (Signed)
Transition of Care [facility]) - Progression Note    Patient Details  Name: [name] [name] MRN: [id number] Date of Birth: 04/28/1951  Transition of [facility]) CM/SW Contact  Jacalyn Lefevre Edson Snowball, RN Phone Number: 01/08/2020, 3:21 PM  Clinical Narrative:     Discussed PT's recommendation for home health PT and 24 hour supervision.Patient voiced understanding. See prior note. Patient from home with son. Son is there in evenings and at night. Patient has an aide everyday from 10 am to 1 pm. Patient feels comfortable going home with HHPT with [place] and aide and son.   She already has [facility].   She has been to a SNF before and does not want to go to a SNF.   Expected Discharge Plan: [place] Barriers to Discharge: Continued Medical Work up  Expected Discharge Plan and Services Expected Discharge Plan: [place]   Discharge Planning Services: CM Consult Post Acute Care Choice: [place] arrangements for the past 2 months: Apartment                 DME Arranged: N/A DME Agency: NA       HH Arranged: PT [place] Agency: [facility]) Date HH Agency Contacted: 01/07/20 Time [place]: 2423 Representative spoke with at [place]: [place] ([place]) Interventions    Readmission Risk Interventions No flowsheet data found.

## 2020-01-08 NOTE — Plan of Care (Signed)

## 2020-01-08 NOTE — Consult Note (Signed)
[facility] Renal Consultation Note  Requesting MD:  Indication for Consultation: Acute on chronic kidney disease stage IV/stage V, maintenance of euvolemia, evaluation and treatment of electrolyte abnormalities, evaluation and treatment of acid base abnormalities, evaluation and treatment of anemia.  HPI:   [name] [name] is a 69 y.o. female.  With a history of chronic kidney disease stage IV baseline serum creatinine appears to be between 3 to 4 mg/dL.  That looks to have been some progression of her renal disease in the records over the past 6 to 12 months.  She does have a history of diabetes mellitus type 2 she also has a history of chronic congestive heart failure.  2D echo 03/18/2019 showed ejection fraction of 45 to 55%.  She presented with urosepsis.  With gram-negative rod bacteremia.  Home medications include allopurinol 100 mg daily carvedilol 12.5 mg twice daily cinacalcet 30 mg nightly Plavix 75 mg daily, TriCor 160 mg daily insulin, levothyroxine 150 mcg daily Protonix 40 mg daily, torsemide 80 mg in the morning 80 mg in the evening, simvastatin 20 mg daily metolazone Monday Wednesday Friday  Blood pressure 104/53 pulse 78 temperature 97.6 O2 sats 97% 2 L nasal cannula  Sodium 133 potassium 4.7 chloride 87 CO2 31 creatinine 4.98 BUN 130 glucose 196 calcium 8.5 phosphorus 5.9 magnesium 1.8 albumin 2.3 liver enzymes within normal range WBC 10.5 hemoglobin 7.6 platelets 246.  Carvedilol 12.5 mg twice daily, allopurinol 100 mg daily vitamin D 2000 units daily Sensipar 30 mg daily Plavix 75 mg daily fenofibrate 160 mg daily insulin sliding scale, levothyroxine 150 mcg daily magnesium oxide 400 mg daily Myrbetriq 25 mg, renal vitamins 1 daily Protonix 40 mg daily, simvastatin 20 mg daily potassium 20 mEq daily torsemide 80 mg twice daily  Rocephin 2 g every 24 hours  CT abdomen 01/06/2020 bilateral renal adenomas status post cholecystectomy left hemicolectomy hysterectomy  no evidence of bowel obstruction normal appendix no signs of obstruction  Urinalysis leukocytes positive protein 100 mg/dL greater than 50 RBCs greater than 50 WBCs urine positive for E. coli pansensitive  Creat  Date/Time Value Ref Range Status  06/29/2014 11:00 AM 1.32 (H) 0.50 - 1.10 mg/dL Final   Creatinine, Ser  Date/Time Value Ref Range Status  01/08/2020 03:47 AM 4.95 (H) 0.44 - 1.00 mg/dL Final  01/07/2020 03:27 AM 4.44 (H) 0.44 - 1.00 mg/dL Final  01/06/2020 02:43 AM 3.93 (H) 0.44 - 1.00 mg/dL Final  01/05/2020 07:01 PM 3.90 (H) 0.44 - 1.00 mg/dL Final  12/04/2019 11:27 AM 3.38 (H) 0.44 - 1.00 mg/dL Final  10/27/2019 05:20 AM 3.13 (H) 0.44 - 1.00 mg/dL Final  10/26/2019 04:12 PM 3.26 (H) 0.44 - 1.00 mg/dL Final  09/22/2019 03:28 AM 3.81 (H) 0.44 - 1.00 mg/dL Final  09/21/2019 07:55 AM 4.17 (H) 0.44 - 1.00 mg/dL Final  09/20/2019 10:21 AM 3.80 (H) 0.44 - 1.00 mg/dL Final  09/15/2019 10:58 AM 3.08 (H) 0.44 - 1.00 mg/dL Final  04/08/2019 12:45 PM 2.20 (H) 0.44 - 1.00 mg/dL Final  03/21/2019 04:55 AM 2.34 (H) 0.44 - 1.00 mg/dL Final  03/20/2019 05:38 AM 2.77 (H) 0.44 - 1.00 mg/dL Final  03/19/2019 05:25 AM 2.80 (H) 0.44 - 1.00 mg/dL Final  03/18/2019 03:14 AM 1.96 (H) 0.44 - 1.00 mg/dL Final  03/17/2019 04:13 AM 2.13 (H) 0.44 - 1.00 mg/dL Final  03/16/2019 01:41 PM 2.05 (H) 0.44 - 1.00 mg/dL Final  12/12/2018 11:33 AM 2.38 (H) 0.44 - 1.00 mg/dL Final  11/28/2018 06:14 AM 2.41 (  H) 0.44 - 1.00 mg/dL Final  11/27/2018 12:50 PM 2.20 (H) 0.44 - 1.00 mg/dL Final  11/14/2018 09:35 AM 2.02 (H) 0.44 - 1.00 mg/dL Final  10/31/2018 05:24 AM 2.92 (H) 0.44 - 1.00 mg/dL Final  10/30/2018 05:33 AM 2.89 (H) 0.44 - 1.00 mg/dL Final  10/29/2018 04:12 AM 3.02 (H) 0.44 - 1.00 mg/dL Final  10/28/2018 04:32 AM 3.36 (H) 0.44 - 1.00 mg/dL Final  10/27/2018 05:21 AM 3.02 (H) 0.44 - 1.00 mg/dL Final  10/26/2018 04:50 AM 2.93 (H) 0.44 - 1.00 mg/dL Final  10/25/2018 06:48 AM 2.41 (H) 0.44 - 1.00 mg/dL  Final  10/24/2018 04:08 AM 2.68 (H) 0.44 - 1.00 mg/dL Final  10/23/2018 05:55 AM 2.72 (H) 0.44 - 1.00 mg/dL Final  10/22/2018 04:53 AM 2.99 (H) 0.44 - 1.00 mg/dL Final  10/21/2018 04:42 AM 2.36 (H) 0.44 - 1.00 mg/dL Final  10/20/2018 01:42 PM 2.07 (H) 0.44 - 1.00 mg/dL Final  10/20/2018 08:53 AM 1.97 (H) 0.44 - 1.00 mg/dL Final  05/06/2017 04:14 PM 2.44 (H) 0.44 - 1.00 mg/dL Final  04/21/2016 05:51 AM 2.47 (H) 0.44 - 1.00 mg/dL Final  04/20/2016 03:51 AM 2.28 (H) 0.44 - 1.00 mg/dL Final  04/19/2016 12:52 PM 2.24 (H) 0.44 - 1.00 mg/dL Final  03/16/2016 06:39 AM 2.30 (H) 0.44 - 1.00 mg/dL Final  04/08/2015 01:33 PM 1.49 (H) 0.44 - 1.00 mg/dL Final  03/22/2015 02:36 AM 2.06 (H) 0.44 - 1.00 mg/dL Final  03/21/2015 05:41 PM 2.00 (H) 0.44 - 1.00 mg/dL Final  08/13/2014 05:00 AM 1.36 (H) 0.50 - 1.10 mg/dL Final  08/12/2014 04:48 AM 1.52 (H) 0.50 - 1.10 mg/dL Final  08/11/2014 09:12 PM 1.56 (H) 0.50 - 1.10 mg/dL Final  08/04/2014 02:30 PM 1.65 (H) 0.50 - 1.10 mg/dL Final  12/23/2013 11:32 AM 1.38 (H) 0.57 - 1.00 mg/dL Final  02/18/2013 05:00 AM 0.95 0.50 - 1.10 mg/dL Final  02/17/2013 05:30 AM 0.98 0.50 - 1.10 mg/dL Final  02/16/2013 05:00 AM 0.99 0.50 - 1.10 mg/dL Final  02/15/2013 05:00 AM 0.99 0.50 - 1.10 mg/dL Final     PMHx:   Past Medical History:  Diagnosis Date  . Anemia   . Arthritis   . Asthma   . Bronchitis   . CHF (congestive heart failure) ([facility])   . Chronic kidney disease   . Chronic kidney disease   . Chronic pain syndrome   . COPD (chronic obstructive pulmonary disease) ([facility])   . Cramping of feet   . Diabetes mellitus   . Dyspnea   . GERD (gastroesophageal reflux disease)   . Gout   . Headache   . History of hiatal hernia   . History of hip replacement, total   . History of transfusion   . Hx MRSA infection    abscess left groin  . Hx of cardiac cath 06/2014  . Hyperlipidemia   . Hypertension   . Hypothyroid   . 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  Morbidly obese ([facility])   . Peripheral vascular disease ([facility])   . Positive cardiac stress test 12/2013   anterior and lateral ischemia on Myoview  . Sleep apnea    UNABLE TO TOLERATE C PAP  . Stress incontinence   . Thyroid disease   . TIA (transient ischemic attack)     X2 NO RESIDUAL PROBLEMS    Past Surgical History:  Procedure Laterality Date  . ABDOMINAL HYSTERECTOMY    . BACK SURGERY  2020  . COLON SURGERY  1995   DUE TO POLYP  . DILATION AND CURETTAGE OF UTERUS    . FOREARM FRACTURE SURGERY     Left arm  . HERNIA REPAIR     w/ mesh  . INCISION AND DRAINAGE ABSCESS Left 02/04/2013   Procedure: INCISION AND DRAINAGE LEFT BUTTOCK ABSCESS; INCISION AND DRAINAGE LEFT BREAST ABSCESS;  Surgeon: Harl Bowie, MD;  Location: [facility];  Service: General;  Laterality: Left;  . INCISION AND DRAINAGE ABSCESS N/A 02/12/2013   Procedure: INCISION AND DEBRIDEMENT BUTTOCK WOUND ;  Surgeon: [name]. Georgette Dover, MD;  Location: [facility];  Service: General;  Laterality: N/A;  . INCISION AND DRAINAGE ABSCESS N/A 02/14/2013   Procedure: INCISION AND DRAINAGE/DRESSING CHANGE;  Surgeon: Harl Bowie, MD;  Location: [facility];  Service: General;  Laterality: N/A;  . INCISION AND DRAINAGE ABSCESS N/A 03/21/2015   Procedure: INCISION AND DRAINAGE PUBIC ABSCESS;  Surgeon: Excell Seltzer, MD;  Location: [facility];  Service: General;  Laterality: N/A;  . INCISION AND DRAINAGE PERIRECTAL ABSCESS Left 02/10/2013   Procedure: IRRIGATION AND DEBRIDEMENT OF BUTTOCK/PERINEAL ABSCESS;  Surgeon: [name]. Georgette Dover, MD;  Location: [facility];  Service: General;  Laterality: Left;  . INCISION AND DRAINAGE PERIRECTAL ABSCESS N/A 02/16/2013   Procedure: IRRIGATION AND DEBRIDEMENT PERINEAL ABSCESS;  Surgeon: Zenovia Jarred, MD;  Location: [facility];  Service: General;  Laterality: N/A;  . IRRIGATION AND DEBRIDEMENT ABSCESS N/A 02/06/2013   Procedure: IRRIGATION AND DEBRIDEMENT BUTTOCK ABSCESS AND DRESSING CHANGE;  Surgeon: Harl Bowie, MD;  Location: [facility];  Service: General;  Laterality: N/A;  . IRRIGATION AND DEBRIDEMENT ABSCESS Left 02/08/2013   Procedure: IRRIGATION AND DEBRIDEMENT ABSCESS/DRESSING CHANGE;  Surgeon: Gwenyth Ober, MD;  Location: [facility];  Service: General;  Laterality: Left;  . JOINT REPLACEMENT  2010 / 2012   . LAPAROSCOPIC CHOLECYSTECTOMY    . LEFT HEART CATHETERIZATION WITH CORONARY ANGIOGRAM N/A 07/02/2014   Procedure: LEFT HEART CATHETERIZATION WITH CORONARY ANGIOGRAM;  Surgeon: Lorretta Harp, MD;  Location: [facility];  Service: Cardiovascular;  Laterality: N/A;  . LOWER EXTREMITY ANGIOGRAM Right 04/20/2016   Procedure: Lower Extremity Angiogram;  Surgeon: Elam Dutch, MD;  Location: [facility];  Service: Cardiovascular;  Laterality: Right;  . PERIPHERAL VASCULAR CATHETERIZATION N/A 04/20/2016   Procedure: Abdominal Aortogram;  Surgeon: Elam Dutch, MD;  Location: [facility];  Service: Cardiovascular;  Laterality: N/A;  . PERIPHERAL VASCULAR CATHETERIZATION Right 04/20/2016   Procedure: Peripheral Vascular Intervention;  Surgeon: Elam Dutch, MD;  Location: [facility];  Service: Cardiovascular;  Laterality: Right;  popiteal  . RIGHT HEART CATH N/A 10/27/2018   Procedure: RIGHT HEART CATH;  Surgeon: Larey Dresser, MD;  Location: [facility];  Service: Cardiovascular;  Laterality: N/A;  . RIGHT HEART CATH N/A 03/20/2019   Procedure: RIGHT HEART CATH;  Surgeon: Larey Dresser, MD;  Location: [facility];  Service: Cardiovascular;  Laterality: N/A;  . THYROIDECTOMY    . TOTAL HIP REVISION Right 08/11/2014   Procedure: RIGHT ACETABULAR REVISION;  Surgeon: Gearlean Alf, MD;  Location[facility];  Service: Orthopedics;  Laterality: Right;  [facility] VASCULAR SURGERY      Family Hx:  Family History  Problem Relation Age of Onset  . Diabetes Brother   . Cardiomyopathy Mother   . Heart disease Mother   . Cancer Father     Social History:  reports  that she has been smoking  cigarettes. She has a 10.00 pack-year smoking history. She has never used smokeless tobacco. She reports current alcohol use of about 1.0 standard drinks of alcohol per week. She reports that she does not use drugs.  Allergies:  Allergies  Allergen Reactions  . Nebivolol Swelling    Chest pain  . Ace Inhibitors Swelling and Other (See Comments)    Tongue swell  . Morphine And Related Itching    Medications: Prior to Admission medications   Medication Sig Start Date End Date Taking? Authorizing Provider  albuterol (PROAIR HFA) 108 (90 Base) MCG/ACT inhaler Inhale 2 puffs into the lungs 4 (four) times daily as needed for wheezing or shortness of breath.   Yes [provider]  albuterol (PROVENTIL) (2.5 MG/3ML) 0.083% nebulizer solution Take 2.5 mg by nebulization every 6 (six) hours as needed for wheezing or shortness of breath.   Yes [provider]  allopurinol (ZYLOPRIM) 100 MG tablet Take 100 mg by mouth every morning.    Yes [provider]  B Complex Vitamins (VITAMIN B COMPLEX PO) Take 1 tablet by mouth 2 (two) times daily.    Yes [provider]  budesonide-formoterol (SYMBICORT) 160-4.5 MCG/ACT inhaler Inhale 2 puffs into the lungs 2 (two) times daily as needed (for SOB).    Yes [provider]  carvedilol (COREG) 12.5 MG tablet Take 12.5 mg by mouth 2 (two) times daily. 11/25/19  Yes [provider]  cetirizine (ZYRTEC) 10 MG tablet Take 10 mg by mouth daily.  03/14/15  Yes [provider]  Cholecalciferol (VITAMIN D) 50 MCG (2000 UT) CAPS Take 2,000 Units by mouth daily.   Yes [provider]  cinacalcet (SENSIPAR) 30 MG tablet Take 30 mg by mouth daily.   Yes [provider]  clopidogrel (PLAVIX) 75 MG tablet TAKE 1 TABLET BY MOUTH EVERY DAY Patient taking differently: Take 75 mg by mouth daily.  06/18/17  Yes Waynetta Sandy, MD  Dulaglutide 1.5 MG/0.5ML SOPN Inject  1.5 mg into the skin every Monday.    Yes [provider]  fenofibrate (TRICOR) 145 MG tablet TAKE 1 TABLET (145 MG TOTAL) BY MOUTH DAILY. Patient taking differently: Take 145 mg by mouth daily.  05/11/15  Yes Lorretta Harp, MD  fluticasone (FLONASE) 50 MCG/ACT nasal spray Place 2 sprays into the nose as needed for allergies.    Yes [provider]  hydrOXYzine (ATARAX/VISTARIL) 25 MG tablet Take 25 mg by mouth 2 (two) times daily.    Yes [provider]  insulin lispro protamine-lispro (HUMALOG 75/25 MIX) (75-25) 100 UNIT/ML SUSP injection Inject 35-40 Units into the skin See admin instructions. Inject 40 units into the skin morning and 35 units in the evening   Yes [provider]  levothyroxine (SYNTHROID, LEVOTHROID) 150 MCG tablet Take 150 mcg by mouth daily before breakfast.   Yes [provider]  metolazone (ZAROXOLYN) 5 MG tablet Take 5 mg by mouth every Monday, Wednesday, and Friday.  05/22/19  Yes [provider]  mirabegron ER (MYRBETRIQ) 50 MG TB24 tablet Take 50 mg by mouth daily.   Yes [provider]  Multiple Vitamin (MULTIVITAMIN WITH MINERALS) TABS tablet Take 1 tablet by mouth daily.   Yes [provider]  oxyCODONE-acetaminophen (PERCOCET) 10-325 MG tablet Take 1 tablet by mouth every 4 (four) hours as needed for pain. 09/22/19  Yes Eustace Moore, MD  pantoprazole (PROTONIX) 40 MG tablet Take 1 tablet (40 mg total) by mouth daily. 03/22/19  Yes Guilford Shi, MD  polyvinyl alcohol (LIQUIFILM TEARS) 1.4 % ophthalmic solution Place 2 drops into both eyes daily as needed for dry eyes.   Yes [provider]  Potassium Chloride ER 20 MEQ TBCR Take 20 mEq by mouth every Monday, Wednesday, and Friday.  07/15/19  Yes [provider]  tiZANidine (ZANAFLEX) 4 MG tablet Take 4 mg by mouth 2 (two) times daily.    Yes [provider]  torsemide (DEMADEX) 20 MG tablet Take 4 tablets (80 mg  total) by mouth every morning AND 2 tablets (40 mg total) every evening. Patient taking differently: Take 80 mg twice daily 10/31/18  Yes Mariel Aloe, MD  simvastatin (ZOCOR) 20 MG tablet Take 20 mg by mouth at bedtime. 04/08/17   [provider]    Labs:  Results for orders placed or performed during the hospital encounter of 01/05/20 (from the past 48 hour(s))  CBG monitoring, ED     Status: Abnormal   Collection Time: 01/06/20 11:59 AM  Result Value Ref Range   Glucose-Capillary 382 (H) 70 - 99 mg/dL    Comment: Glucose reference range applies only to samples taken after fasting for at least 8 hours.  Urine culture     Status: Abnormal   Collection Time: 01/06/20  4:04 PM   Specimen: In/Out Cath Urine  Result Value Ref Range   Specimen Description IN/OUT CATH URINE    Special Requests      NONE Performed at [facility], [address], [place], [place] [address]    Culture 5,000 COLONIES/mL ESCHERICHIA COLI (A)    Report Status 01/08/2020 FINAL    Organism ID, Bacteria ESCHERICHIA COLI (A)       Susceptibility   Escherichia coli - MIC*    AMPICILLIN <=2 SENSITIVE Sensitive     CEFAZOLIN <=4 SENSITIVE Sensitive     CEFTRIAXONE <=0.25 SENSITIVE Sensitive     CIPROFLOXACIN <=0.25 SENSITIVE Sensitive     GENTAMICIN <=1 SENSITIVE Sensitive     IMIPENEM <=0.25 SENSITIVE Sensitive     NITROFURANTOIN <=16 SENSITIVE Sensitive     TRIMETH/SULFA <=20 SENSITIVE Sensitive     AMPICILLIN/SULBACTAM <=2 SENSITIVE Sensitive     PIP/TAZO <=4 SENSITIVE Sensitive     * 5,000 COLONIES/mL ESCHERICHIA COLI  Glucose, capillary     Status: Abnormal   Collection Time: 01/06/20  4:53 PM  Result Value Ref Range   Glucose-Capillary 312 (H) 70 - 99 mg/dL    Comment: Glucose reference range applies only to samples taken after fasting for at least 8 hours.  Glucose, capillary     Status: Abnormal   Collection Time: 01/06/20  9:17 PM  Result Value Ref Range   Glucose-Capillary 268 (H)  70 - 99 mg/dL    Comment: Glucose reference range applies only to samples taken after fasting for at least 8 hours.  CBC     Status: Abnormal   Collection Time: 01/07/20  3:27 AM  Result Value Ref Range   WBC 16.2 (H) 4.0 - 10.5 K/uL   RBC 2.97 (L) 3.87 - 5.11 MIL/uL   Hemoglobin 8.2 (L) 12.0 - 15.0 g/dL   HCT 25.7 (L) 36.0 - 46.0 %   MCV 86.5 80.0 - 100.0 fL   MCH 27.6 26.0 - 34.0 pg   MCHC 31.9 30.0 - 36.0 g/dL   RDW 16.3 (H) 11.5 - 15.5 %   Platelets 245 150 - 400 K/uL   nRBC 0.0 0.0 - 0.2 %  Comment: Performed at [facility], [place] [address], [place], [place] [address]  Basic metabolic panel     Status: Abnormal   Collection Time: 01/07/20  3:27 AM  Result Value Ref Range   Sodium 131 (L) 135 - 145 mmol/L   Potassium 4.1 3.5 - 5.1 mmol/L   Chloride 86 (L) 98 - 111 mmol/L   CO2 30 22 - 32 mmol/L   Glucose, Bld 240 (H) 70 - 99 mg/dL    Comment: Glucose reference range applies only to samples taken after fasting for at least 8 hours.   BUN 118 (H) 8 - 23 mg/dL   Creatinine, Ser 4.44 (H) 0.44 - 1.00 mg/dL   Calcium 8.7 (L) 8.9 - 10.3 mg/dL   GFR calc non Af Amer 9 (L) >60 mL/min   GFR calc Af Amer 11 (L) >60 mL/min   Anion gap 15 5 - 15    Comment: Performed at [facility] [address], [place], [place] [address]  Magnesium     Status: Abnormal   Collection Time: 01/07/20  3:27 AM  Result Value Ref Range   Magnesium 1.6 (L) 1.7 - 2.4 mg/dL    Comment: Performed at [facility] [address], [place], [place] [address]  Glucose, capillary     Status: Abnormal   Collection Time: 01/07/20  7:42 AM  Result Value Ref Range   Glucose-Capillary 217 (H) 70 - 99 mg/dL    Comment: Glucose reference range applies only to samples taken after fasting for at least 8 hours.  Glucose, capillary     Status: Abnormal   Collection Time: 01/07/20 11:51 AM  Result Value Ref Range   Glucose-Capillary 168 (H) 70 - 99 mg/dL    Comment: Glucose reference range applies  only to samples taken after fasting for at least 8 hours.  Glucose, capillary     Status: Abnormal   Collection Time: 01/07/20  5:03 PM  Result Value Ref Range   Glucose-Capillary 170 (H) 70 - 99 mg/dL    Comment: Glucose reference range applies only to samples taken after fasting for at least 8 hours.  Glucose, capillary     Status: Abnormal   Collection Time: 01/07/20  9:53 PM  Result Value Ref Range   Glucose-Capillary 157 (H) 70 - 99 mg/dL    Comment: Glucose reference range applies only to samples taken after fasting for at least 8 hours.  CK     Status: None   Collection Time: 01/08/20  3:47 AM  Result Value Ref Range   Total CK 81 38 - 234 U/L    Comment: Performed at [facility], [place] [address] [place], [place] [address]  CBC     Status: Abnormal   Collection Time: 01/08/20  3:47 AM  Result Value Ref Range   WBC 10.5 4.0 - 10.5 K/uL   RBC 2.74 (L) 3.87 - 5.11 MIL/uL   Hemoglobin 7.6 (L) 12.0 - 15.0 g/dL   HCT 24.4 (L) 36.0 - 46.0 %   MCV 89.1 80.0 - 100.0 fL   MCH 27.7 26.0 - 34.0 pg   MCHC 31.1 30.0 - 36.0 g/dL   RDW 16.6 (H) 11.5 - 15.5 %   Platelets 246 150 - 400 K/uL   nRBC 0.3 (H) 0.0 - 0.2 %    Comment: Performed at [facility] [address], [place], [place] [address]  Comprehensive metabolic panel     Status:  Abnormal   Collection Time: 01/08/20  3:47 AM  Result Value Ref Range   Sodium 133 (L) 135 - 145 mmol/L   Potassium 4.7 3.5 - 5.1 mmol/L   Chloride 87 (L) 98 - 111 mmol/L   CO2 31 22 - 32 mmol/L   Glucose, Bld 196 (H) 70 - 99 mg/dL    Comment: Glucose reference range applies only to samples taken after fasting for at least 8 hours.   BUN 130 (H) 8 - 23 mg/dL   Creatinine, Ser 4.95 (H) 0.44 - 1.00 mg/dL   Calcium 8.5 (L) 8.9 - 10.3 mg/dL   Total Protein 5.8 (L) 6.5 - 8.1 g/dL   Albumin 2.3 (L) 3.5 - 5.0 g/dL   AST 28 15 - 41 U/L   ALT 19 0 - 44 U/L   Alkaline Phosphatase 54 38 - 126 U/L   Total Bilirubin 0.6 0.3 - 1.2 mg/dL   GFR  calc non Af Amer 8 (L) >60 mL/min   GFR calc Af Amer 10 (L) >60 mL/min   Anion gap 15 5 - 15    Comment: Performed at [facility] [address] [place], [place] [address]  Magnesium     Status: None   Collection Time: 01/08/20  3:47 AM  Result Value Ref Range   Magnesium 1.8 1.7 - 2.4 mg/dL    Comment: Performed at [facility] [address] [place],  [address]  Phosphorus     Status: Abnormal   Collection Time: 01/08/20  3:47 AM  Result Value Ref Range   Phosphorus 5.9 (H) 2.5 - 4.6 mg/dL    Comment: Performed at [facility] [address], [place], [place] [address]  Glucose, capillary     Status: Abnormal   Collection Time: 01/08/20  7:39 AM  Result Value Ref Range   Glucose-Capillary 160 (H) 70 - 99 mg/dL    Comment: Glucose reference range applies only to samples taken after fasting for at least 8 hours.     ROS:  General  Positive fever sweats chills Eyes no visual complaints double vision blurred vision Ears nose mouth throat no hearing loss epistaxis or sore throat Cardiovascular no anginal chest pain no orthopnea PND does have lower extremity edema Respiratory denies cough wheeze hemoptysis Abdominal system no abdominal pain nausea vomiting change in bowel habits blood in stools Urogenital positive for urinary tract infection Dermatologic no skin rash or itching Endocrine history of diabetes on insulin Neurologic no stroke seizures numbness or tingling    Physical Exam: Vitals:   01/08/20 0748 01/08/20 1115  BP:  (!) 104/53  Pulse:  78  Resp:    Temp:    SpO2: 97%      General: Awake oriented not in any distress 1 L nasal cannula HEENT: Oral mucosa was moist Eyes: Pupils round equal reactive no icterus no pallor Neck: Supple JVP not elevated Heart: Regular rate and rhythm no murmurs rubs or gallops Lungs: Clear to auscultation no wheeze or rales Abdomen: Soft nontender bowel sounds present Extremities: peripheral edema  lateral Skin: Warm without rashes Neuro: Grossly intact  Assessment/Plan: 1.Acute on chronic kidney disease with worsening azotemia this is being in the setting of aggressive diuresis with a combination of torsemide and metolazone.  There is no obstruction on CT scan.  Patient appears to be making urine although is clearly volume overloaded.  Urinalysis is positive for pyuria with a positive urine culture for E. coli.  This appears to be progression of her renal disease.  I believe that she will need to start dialysis in the near future.  She has azotemia without the use of steroids or GI bleed.  This in the setting of her congestive heart failure with systolic dysfunction is troubling for cardiorenal syndrome.  Will obtain vein mapping and involve vein and vascular surgery to see if we can place vascular access.  It is likely that she will need dialysis this admission.  Would avoid nephrotoxins ACE inhibitor's ARB use nonsteroidal anti-inflammatory drugs.  We will try to avoid the use of IV contrast 2. Hypertension/volume  -clearly has volume overload received IV Lasix 01/07/2020.  Continues on torsemide 100 mg twice daily 3.  Anemia we will check iron studies and start darbepoetin 100 mcg weekly. 4.  Bones will check PTH and follow renal panel for evaluation of phosphorus 5.  Hyponatremia.  Probably as result of renal disease but also could be in the setting of Zaroxolyn.. 6.  Diabetes mellitus as per primary team. 7.  E. coli UTI currently on therapy with IV antibiotics   Sherril Croon 01/08/2020, 11:51 AM

## 2020-01-08 NOTE — Progress Notes (Signed)
Physical Therapy Treatment Patient Details Name: [name] [name] MRN: [id number] DOB: 1950-10-09 Today's Date: 01/08/2020    History of Present Illness 69 y.o. 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 Pt presents to ED with 2 day h/o lower abd pain, associated N/V.    PT Comments    Pt OOB in recliner upon arrival of PT, agreeable to PT session with focus on progressing independence with mobility. The pt continues to present with limitations in functional mobility and endurance, but was able to demo multiple transfers and short ambulation with use of RW but no physical assist. The pt will continue to benefit from skilled therapy to further progress functional activity and endurance, but will be safe to return home with intermittent supervision from son and caregiver who comes 3hrs/day in addition to [place].   Follow Up Recommendations  Home health PT;Supervision/Assistance - 24 hour     Equipment Recommendations  (pt has needed equipment (rollator))    Recommendations for Other Services       Precautions / Restrictions Precautions Precautions: Fall Restrictions Weight Bearing Restrictions: No    Mobility  Bed Mobility Overal bed mobility: Needs Assistance Bed Mobility: Sit to Supine       Sit to supine: Min guard   General bed mobility comments: pt able to complete movement without physical assist, minG for VCs and sequencing, espcially for repositioning  Transfers Overall transfer level: Needs assistance   Transfers: Sit to/from Stand Sit to Stand: Min guard         General transfer comment: minG for sit from multiple surfaces, 5xSTS in 87.5 sec (age norm = 11.4 sec)  Ambulation/Gait Ambulation/Gait assistance: Min guard Gait Distance (Feet): 30 Feet Assistive device: Rolling walker (2 wheeled) Gait Pattern/deviations: Step-through pattern;Decreased stride length;Trunk flexed Gait velocity: reduced Gait velocity  interpretation: <1.31 ft/sec, indicative of household ambulator General Gait Details: pt with short strides, but reports this slow gait is how she mobilizes at home, able to manage RW even through tight spaces in room without need for assistance, limited by fatigue and arrival of IV team to place IV   Stairs             Wheelchair Mobility    Modified Rankin (Stroke Patients Only)       Balance Overall balance assessment: Needs assistance Sitting-balance support: No upper extremity supported;Feet supported Sitting balance-Leahy Scale: Good Sitting balance - Comments: unilateral UE support in recliner, close supervision   Standing balance support: Bilateral upper extremity supported Standing balance-Leahy Scale: Fair Standing balance comment: minG with BUE support of RW                            Cognition Arousal/Alertness: Awake/alert Behavior During Therapy: WFL for tasks assessed/performed Overall Cognitive Status: Within Functional Limits for tasks assessed                                 General Comments: pt cog generally funtional today, agreeable and cooperative, reports she is feeling much better and much more herself now      Exercises      General Comments General comments (skin integrity, edema, etc.): pt SpO2 ranged from 92-98% on 2L, did not reduce at this time due to low fluccuations.      Pertinent Vitals/Pain Pain Assessment: Faces Faces Pain Scale: Hurts a  little bit Pain Location: back with STS Pain Descriptors / Indicators: Aching;Grimacing Pain Intervention(s): Limited activity within patient's tolerance;Monitored during session    Home Living                      Prior Function            PT Goals (current goals can now be found in the care plan section) Acute Rehab PT Goals Patient Stated Goal: To improve mobility and go home PT Goal Formulation: With patient Time For Goal Achievement:  01/21/20 Potential to Achieve Goals: Good Progress towards PT goals: Progressing toward goals    Frequency    Min 3X/week      PT Plan Discharge plan needs to be updated    Co-evaluation              AM-PAC PT "6 Clicks" Mobility   Outcome Measure  Help needed turning from your back to your side while in a flat bed without using bedrails?: A Little Help needed moving from lying on your back to sitting on the side of a flat bed without using bedrails?: A Little Help needed moving to and from a bed to a chair (including a wheelchair)?: A Little Help needed standing up from a chair using your arms (e.g., wheelchair or bedside chair)?: A Little Help needed to walk in hospital room?: A Little Help needed climbing 3-5 steps with a railing? : A Lot 6 Click Score: 17    End of Session Equipment Utilized During Treatment: Oxygen;Gait belt Activity Tolerance: Patient limited by fatigue;Patient tolerated treatment well Patient left: with call bell/phone within reach;in bed;with nursing/sitter in room Nurse Communication: Mobility status PT Visit Diagnosis: Muscle weakness (generalized) (M62.81)     Time: 9021-1155 PT Time Calculation (min) (ACUTE ONLY): 16 min  Charges:  $Gait Training: 8-22 mins                     Karma Ganja, PT, DPT   Acute Rehabilitation Department Pager #: [phone]   Otho Bellows 01/08/2020, 12:18 PM

## 2020-01-08 NOTE — Progress Notes (Signed)
[name] NOTE  [name] [name]:[id number] DOB: April 07, 1951 DOA: 01/05/2020 PCP: [name], MD  HPI/Recap of past 14 hours: 69 year old female with past medical history of morbid obesity, diabetes mellitus, chronic diastolic heart failure and stage IV/V chronic kidney disease admitted on early morning of 3/31 for UTI with secondary sepsis.  Following admission, patient's blood cultures grew out E. coli as did her urine.  Patient given IV fluids and antibiotics.  Over the next few days, patient's white count has dramatically improved going from 24 on admission down to 10.5 today.  Unfortunately, her renal function has continued to decline going from 3.3 baseline to 3.9 on admission and has continued to climb and is at 4.95 as of today.  Patient herself states that she feels a little bit better, although very tired.  Nephrology consulted and it is suspected that likely with her sepsis, her already very weak kidneys have continued to receive damage and she will likely need dialysis started during this hospitalization.  Patient complains of significant fatigue, but denies any pain.  Breathing stable.  Assessment/Plan: Principal Problem: Sepsis secondary to UTI (urinary tract infection): Patient met criteria for sepsis on admission given leukocytosis, tachycardia and tachypnea with urinary source.  Treated with antibiotics and fluids.  Lactic acid level stabilized.  Urine and blood cultures grew out E. coli, near pansensitive.  We will try some aggressive hydration Active Problems:   Tobacco abuse: Counseled to quit, declined nicotine patch  Acute kidney injury in the setting of CKD (chronic kidney disease), stage IV/V ([place]): Followed by nephrology.  Likely she will need dialysis during this hospitalization.  We will try some aggressive hydration.    Morbid obesity [facility]): Patient criteria BMI greater than 40    HTN (hypertension): Blood pressures have been somewhat soft during his  hospitalization.    DM (diabetes mellitus), type 2 with renal complications (HCC)   Chronic CHF (congestive heart failure) ([place]): Despite fluid resuscitation, have been limited due to CHF.  Fortunately ejection fraction preserved.  At this time, I do not stop her diuretics and favor her aggressively rehydrating    Anemia, unspecified: Even with hydration, creatinine has worsened but hemoglobin continued to drop.  There is a concern that she may be bleeding.  However Hemoccult negative and hemoglobin is low 7.6 on morning of 4/2, increased to 8.5 without intervention by afternoon.  This is more likely from anemia from renal disease.  We will see if this changes with aggressive hydration   Code Status: Full code  Family Communication: Discussed with patient and son at the bedside  Disposition Plan: Continue inpatient, monitor renal function and patient will likely need dialysis during this hospitalization   Consultants:  Nephrology  Procedures:  None  Antimicrobials:  IV Rocephin 3/31-4/2  Keflex 4/2-present  DVT prophylaxis: SCDs   Objective: Vitals:   01/08/20 1206 01/08/20 1453  BP:  112/60  Pulse: 89 75  Resp:  18  Temp:  98.2 F (36.8 C)  SpO2: 98% 97%    Intake/Output Summary (Last 24 hours) at 01/08/2020 1901 Last data filed at 01/08/2020 1455 Gross per 24 hour  Intake 360 ml  Output --  Net 360 ml   Filed Weights   01/05/20 1828 01/06/20 0158  Weight: 113.4 kg 113.4 kg   Body mass index is 44.29 kg/m.  Exam:   General: Alert and oriented x3, mild distress after hearing about dialysis  HEENT: Normocephalic and atraumatic, mucous membranes are dry  Neck: Thick, narrow  airway  Cardiovascular: Regular rate and rhythm, S1-S2  Respiratory: Decreased breath sounds throughout secondary to body habitus  Abdomen: Soft, obese, nontender, positive bowel sounds  Musculoskeletal: No clubbing or cyanosis, 1+ pitting edema bilaterally  Skin: No skin  breaks, tears or lesions  Psychiatry: Appropriate, no evidence of psychoses  Neuro: No focal deficits   Data Reviewed: CBC: Recent Labs  Lab 01/05/20 1901 01/06/20 0243 01/07/20 0327 01/08/20 0347 01/08/20 1355  WBC 21.5* 24.3* 16.2* 10.5  --   HGB 8.9* 9.6* 8.2* 7.6* 8.5*  HCT 28.8* 31.1* 25.7* 24.4* 27.5*  MCV 90.0 90.4 86.5 89.1  --   PLT 265 289 245 246  --    Basic Metabolic Panel: Recent Labs  Lab 01/05/20 1901 01/06/20 0243 01/07/20 0327 01/08/20 0347  NA 132* 130* 131* 133*  K 3.9 3.8 4.1 4.7  CL 87* 86* 86* 87*  CO2 '28 29 30 31  ' GLUCOSE 311* 277* 240* 196*  BUN 105* 111* 118* 130*  CREATININE 3.90* 3.93* 4.44* 4.95*  CALCIUM 8.8* 9.0 8.7* 8.5*  MG  --   --  1.6* 1.8  PHOS  --   --   --  5.9*   GFR: Estimated Creatinine Clearance: 13 mL/min (A) (by C-G formula based on SCr of 4.95 mg/dL (H)). Liver Function Tests: Recent Labs  Lab 01/05/20 1901 01/08/20 0347  AST 30 28  ALT 17 19  ALKPHOS 57 54  BILITOT 0.3 0.6  PROT 6.2* 5.8*  ALBUMIN 2.9* 2.3*   Recent Labs  Lab 01/05/20 1901  LIPASE 25   No results for input(s): AMMONIA in the last 168 hours. Coagulation Profile: Recent Labs  Lab 01/06/20 0153  INR 1.2   Cardiac Enzymes: Recent Labs  Lab 01/08/20 0347  CKTOTAL 81   BNP (last 3 results) No results for input(s): PROBNP in the last 8760 hours. HbA1C: Recent Labs    01/06/20 0243  HGBA1C 6.5*   CBG: Recent Labs  Lab 01/07/20 2153 01/08/20 0739 01/08/20 1157 01/08/20 1638 01/08/20 1833  GLUCAP 157* 160* 138* 154* 191*   Lipid Profile: No results for input(s): CHOL, HDL, LDLCALC, TRIG, CHOLHDL, LDLDIRECT in the last 72 hours. Thyroid Function Tests: No results for input(s): TSH, T4TOTAL, FREET4, T3FREE, THYROIDAB in the last 72 hours. Anemia Panel: Recent Labs    01/08/20 1355  TIBC 351  IRON 13*   Urine analysis:    Component Value Date/Time   COLORURINE AMBER (A) 01/05/2020 2110   APPEARANCEUR CLOUDY (A)  01/05/2020 2110   LABSPEC 1.013 01/05/2020 2110   PHURINE 5.0 01/05/2020 2110   GLUCOSEU NEGATIVE 01/05/2020 2110   HGBUR LARGE (A) 01/05/2020 2110   BILIRUBINUR NEGATIVE 01/05/2020 2110   KETONESUR NEGATIVE 01/05/2020 2110   PROTEINUR 100 (A) 01/05/2020 2110   UROBILINOGEN 0.2 08/04/2014 1409   NITRITE NEGATIVE 01/05/2020 2110   LEUKOCYTESUR MODERATE (A) 01/05/2020 2110   Sepsis Labs: '@LABRCNTIP' (procalcitonin:4,lacticidven:4)  ) Recent Results (from the past 240 hour(s))  Blood Culture (routine x 2)     Status: Abnormal   Collection Time: 01/06/20  1:53 AM   Specimen: BLOOD  Result Value Ref Range Status   Specimen Description BLOOD RIGHT ARM  Final   Special Requests   Final    BOTTLES DRAWN AEROBIC AND ANAEROBIC Blood Culture adequate volume   Culture  Setup Time   Final    GRAM NEGATIVE RODS IN BOTH AEROBIC AND ANAEROBIC BOTTLES CRITICAL RESULT CALLED TO, READ BACK BY AND VERIFIED WITH: Starlyn Skeans  PharmD 16:20 01/06/20 ([email or web address]) Performed at [facility], [place] [address], [place], [place] [address]    Culture ESCHERICHIA COLI (A)  Final   Report Status 01/08/2020 FINAL  Final   Organism ID, Bacteria ESCHERICHIA COLI  Final      Susceptibility   Escherichia coli - MIC*    AMPICILLIN <=2 SENSITIVE Sensitive     CEFAZOLIN <=4 SENSITIVE Sensitive     CEFEPIME <=0.12 SENSITIVE Sensitive     CEFTAZIDIME <=1 SENSITIVE Sensitive     CEFTRIAXONE <=0.25 SENSITIVE Sensitive     CIPROFLOXACIN <=0.25 SENSITIVE Sensitive     GENTAMICIN <=1 SENSITIVE Sensitive     IMIPENEM <=0.25 SENSITIVE Sensitive     TRIMETH/SULFA <=20 SENSITIVE Sensitive     AMPICILLIN/SULBACTAM <=2 SENSITIVE Sensitive     PIP/TAZO <=4 SENSITIVE Sensitive     * ESCHERICHIA COLI  Blood Culture ID Panel (Reflexed)     Status: Abnormal   Collection Time: 01/06/20  1:53 AM  Result Value Ref Range Status   Enterococcus species NOT DETECTED NOT DETECTED Final   Listeria monocytogenes NOT DETECTED NOT DETECTED  Final   Staphylococcus species NOT DETECTED NOT DETECTED Final   Staphylococcus aureus (BCID) NOT DETECTED NOT DETECTED Final   Streptococcus species NOT DETECTED NOT DETECTED Final   Streptococcus agalactiae NOT DETECTED NOT DETECTED Final   Streptococcus pneumoniae NOT DETECTED NOT DETECTED Final   Streptococcus pyogenes NOT DETECTED NOT DETECTED Final   Acinetobacter baumannii NOT DETECTED NOT DETECTED Final   Enterobacteriaceae species DETECTED (A) NOT DETECTED Final    Comment: Enterobacteriaceae represent a large family of gram-negative bacteria, not a single organism. CRITICAL RESULT CALLED TO, READ BACK BY AND VERIFIED WITH: Starlyn Skeans PharmD 16:20 01/06/20 (wilsonm)    Enterobacter cloacae complex NOT DETECTED NOT DETECTED Final   Escherichia coli DETECTED (A) NOT DETECTED Final    Comment: CRITICAL RESULT CALLED TO, READ BACK BY AND VERIFIED WITH: Starlyn Skeans PharmD 16:20 01/06/20 (wilsonm)    Klebsiella oxytoca NOT DETECTED NOT DETECTED Final   Klebsiella pneumoniae NOT DETECTED NOT DETECTED Final   Proteus species NOT DETECTED NOT DETECTED Final   Serratia marcescens NOT DETECTED NOT DETECTED Final   Carbapenem resistance NOT DETECTED NOT DETECTED Final   Haemophilus influenzae NOT DETECTED NOT DETECTED Final   Neisseria meningitidis NOT DETECTED NOT DETECTED Final   Pseudomonas aeruginosa NOT DETECTED NOT DETECTED Final   Candida albicans NOT DETECTED NOT DETECTED Final   Candida glabrata NOT DETECTED NOT DETECTED Final   Candida krusei NOT DETECTED NOT DETECTED Final   Candida parapsilosis NOT DETECTED NOT DETECTED Final   Candida tropicalis NOT DETECTED NOT DETECTED Final    Comment: Performed at [facility], [place] [address] [place], [place] [address]  SARS CORONAVIRUS 2 (TAT 6-24 HRS) Nasopharyngeal Nasopharyngeal Swab     Status: None   Collection Time: 01/06/20  1:57 AM   Specimen: Nasopharyngeal Swab  Result Value Ref Range Status   SARS Coronavirus 2 NEGATIVE  NEGATIVE Final    Comment: (NOTE) SARS-CoV-2 target nucleic acids are NOT DETECTED. The SARS-CoV-2 RNA is generally detectable in upper and lower respiratory specimens during the acute phase of infection. Negative results do not preclude SARS-CoV-2 infection, do not rule out co-infections with other pathogens, and should not be used as the sole basis for treatment or other patient management decisions. Negative results must be combined with clinical observations, patient history, and epidemiological information. The expected result is Negative. Fact Sheet for  Patients: SugarRoll.be Fact Sheet for Healthcare Providers: https://www.woods-mathews.com/ This test is not yet approved or cleared by the [place] FDA and  has been authorized for detection and/or diagnosis of SARS-CoV-2 by FDA under an Emergency Use Authorization (EUA). This EUA will remain  in effect (meaning this test can be used) for the duration of the COVID-19 declaration under Section 56 4(b)(1) of the Act, 21 U.S.C. section 360bbb-3(b)(1), unless the authorization is terminated or revoked sooner. Performed at [facility], [place] [address] [place], [place] [address]   Blood Culture (routine x 2)     Status: Abnormal   Collection Time: 01/06/20  2:42 AM   Specimen: BLOOD  Result Value Ref Range Status   Specimen Description BLOOD LEFT ARM  Final   Special Requests   Final    BOTTLES DRAWN AEROBIC AND ANAEROBIC Blood Culture adequate volume   Culture  Setup Time   Final    GRAM NEGATIVE RODS IN BOTH AEROBIC AND ANAEROBIC BOTTLES CRITICAL VALUE NOTED.  VALUE IS CONSISTENT WITH PREVIOUSLY REPORTED AND CALLED VALUE.    Culture (A)  Final    ESCHERICHIA COLI SUSCEPTIBILITIES PERFORMED ON PREVIOUS CULTURE WITHIN THE LAST 5 DAYS. Performed at [facility], [place] [address] [place], [place] [address]    Report Status 01/08/2020 FINAL  Final  Urine culture      Status: Abnormal   Collection Time: 01/06/20  4:04 PM   Specimen: In/Out Cath Urine  Result Value Ref Range Status   Specimen Description IN/OUT CATH URINE  Final   Special Requests   Final    NONE Performed at [facility], [place] [address] [place], [place] [address]    Culture 5,000 COLONIES/mL ESCHERICHIA COLI (A)  Final   Report Status 01/08/2020 FINAL  Final   Organism ID, Bacteria ESCHERICHIA COLI (A)  Final      Susceptibility   Escherichia coli - MIC*    AMPICILLIN <=2 SENSITIVE Sensitive     CEFAZOLIN <=4 SENSITIVE Sensitive     CEFTRIAXONE <=0.25 SENSITIVE Sensitive     CIPROFLOXACIN <=0.25 SENSITIVE Sensitive     GENTAMICIN <=1 SENSITIVE Sensitive     IMIPENEM <=0.25 SENSITIVE Sensitive     NITROFURANTOIN <=16 SENSITIVE Sensitive     TRIMETH/SULFA <=20 SENSITIVE Sensitive     AMPICILLIN/SULBACTAM <=2 SENSITIVE Sensitive     PIP/TAZO <=4 SENSITIVE Sensitive     * 5,000 COLONIES/mL ESCHERICHIA COLI      Studies: No results found.  Scheduled Meds: . allopurinol  100 mg Oral Daily  . bisacodyl  10 mg Rectal Daily  . carvedilol  12.5 mg Oral BID  . [START ON 01/09/2020] cephALEXin  500 mg Oral Q8H  . cholecalciferol  2,000 Units Oral Daily  . cinacalcet  30 mg Oral Q breakfast  . clopidogrel  75 mg Oral Daily  . fenofibrate  160 mg Oral Daily  . heparin  5,000 Units Subcutaneous Q8H  . hydrOXYzine  25 mg Oral BID  . insulin aspart  0-15 Units Subcutaneous TID WC  . insulin aspart  6 Units Subcutaneous TID WC  . insulin NPH Human  20 Units Subcutaneous BID AC & HS  . levothyroxine  150 mcg Oral Q0600  . magnesium oxide  400 mg Oral BID  . mirabegron ER  25 mg Oral Daily  . mometasone-formoterol  2 puff Inhalation BID  . multivitamin with minerals  1 tablet Oral Daily  . pantoprazole  40 mg Oral  Daily  . polyethylene glycol  17 g Oral Daily  . potassium chloride  20 mEq Oral Q M,W,F  . simvastatin  20 mg Oral QHS  . tiZANidine  4 mg Oral BID  . torsemide   80 mg Oral BID    Continuous Infusions:   LOS: 1 day     Annita Brod, MD [facility]   01/08/2020, 7:01 PM

## 2020-01-09 ENCOUNTER — Inpatient Hospital Stay (HOSPITAL_COMMUNITY): Payer: Medicare Other

## 2020-01-09 DIAGNOSIS — N186 End stage renal disease: Secondary | ICD-10-CM

## 2020-01-09 LAB — CBC
HCT: 24.1 % — ABNORMAL LOW (ref 36.0–46.0)
Hemoglobin: 7.6 g/dL — ABNORMAL LOW (ref 12.0–15.0)
MCH: 28 pg (ref 26.0–34.0)
MCHC: 31.5 g/dL (ref 30.0–36.0)
MCV: 88.9 fL (ref 80.0–100.0)
Platelets: 261 10*3/uL (ref 150–400)
RBC: 2.71 MIL/uL — ABNORMAL LOW (ref 3.87–5.11)
RDW: 16.5 % — ABNORMAL HIGH (ref 11.5–15.5)
WBC: 8.2 10*3/uL (ref 4.0–10.5)
nRBC: 0.4 % — ABNORMAL HIGH (ref 0.0–0.2)

## 2020-01-09 LAB — COMPREHENSIVE METABOLIC PANEL
ALT: 17 U/L (ref 0–44)
AST: 20 U/L (ref 15–41)
Albumin: 2.2 g/dL — ABNORMAL LOW (ref 3.5–5.0)
Alkaline Phosphatase: 51 U/L (ref 38–126)
Anion gap: 14 (ref 5–15)
BUN: 131 mg/dL — ABNORMAL HIGH (ref 8–23)
CO2: 32 mmol/L (ref 22–32)
Calcium: 8.3 mg/dL — ABNORMAL LOW (ref 8.9–10.3)
Chloride: 89 mmol/L — ABNORMAL LOW (ref 98–111)
Creatinine, Ser: 4.49 mg/dL — ABNORMAL HIGH (ref 0.44–1.00)
GFR calc Af Amer: 11 mL/min — ABNORMAL LOW (ref >60)
GFR calc non Af Amer: 9 mL/min — ABNORMAL LOW (ref >60)
Glucose, Bld: 144 mg/dL — ABNORMAL HIGH (ref 70–99)
Potassium: 4.5 mmol/L (ref 3.5–5.1)
Sodium: 135 mmol/L (ref 135–145)
Total Bilirubin: 0.7 mg/dL (ref 0.3–1.2)
Total Protein: 5.8 g/dL — ABNORMAL LOW (ref 6.5–8.1)

## 2020-01-09 LAB — GLUCOSE, CAPILLARY
Glucose-Capillary: 125 mg/dL — ABNORMAL HIGH (ref 70–99)
Glucose-Capillary: 152 mg/dL — ABNORMAL HIGH (ref 70–99)
Glucose-Capillary: 124 mg/dL — ABNORMAL HIGH (ref 70–99)
Glucose-Capillary: 156 mg/dL — ABNORMAL HIGH (ref 70–99)

## 2020-01-09 LAB — OCCULT BLOOD X 1 CARD TO LAB, STOOL: Fecal Occult Bld: NEGATIVE

## 2020-01-09 MED ORDER — FUROSEMIDE 10 MG/ML IJ SOLN
80.0000 mg | Freq: Two times a day (BID) | INTRAMUSCULAR | Status: DC
  Administered 2020-01-09 – 2020-01-11 (×5): 80 mg via INTRAVENOUS
  Filled 2020-01-09 (×5): qty 8

## 2020-01-09 MED ORDER — SODIUM CHLORIDE 0.9 % IV SOLN
250.0000 mg | Freq: Every day | INTRAVENOUS | Status: AC
  Administered 2020-01-09 – 2020-01-12 (×4): 250 mg via INTRAVENOUS
  Filled 2020-01-09 (×4): qty 20

## 2020-01-09 NOTE — Progress Notes (Signed)
PROGRESS NOTE  VERNE COVE WCB:[id number] DOB: 09-Jun-1951 DOA: 01/05/2020 PCP: [name], MD  HPI/Recap of past 33 hours: 69 year old female with past medical history of morbid obesity, diabetes mellitus, chronic diastolic heart failure and stage IV/V chronic kidney disease admitted on early morning of 3/31 for UTI with secondary sepsis.  Following admission, patient's blood cultures grew out E. coli as did her urine.  Patient given IV fluids and antibiotics.  Over the next few days, patient's white count has dramatically improved going from 24 on admission down to 10.5 today.  Unfortunately, her renal function has continued to decline going from 3.3 baseline to 3.9 on admission and has continued to climb and is at 4.95 as of today.  Patient herself states that she feels a little bit better, although very tired.  Nephrology consulted and it is suspected that likely with her sepsis, her already very weak kidneys have continued to receive damage and she will likely need dialysis started during this hospitalization.  Overnight, no issues.  Patient had gotten some IV fluids in hopes to improve her kidneys, but it appeared to worsen her heart failure when she was a bit more dyspneic today.  She also complains of a nosebleed from dry nasal passage from oxygen.  She complains of just generalized pain  Assessment/Plan: Principal Problem: Sepsis secondary to UTI (urinary tract infection): Patient met criteria for sepsis on admission given leukocytosis, tachycardia and tachypnea with urinary source.  Treated with antibiotics and fluids.  Lactic acid level stabilized.  Urine and blood cultures grew out E. coli, near pansensitive.  Sepsis itself stabilized, continue antibiotics Active Problems:   Tobacco abuse: Counseled to quit, declined nicotine patch  Acute kidney injury in the setting of CKD (chronic kidney disease), stage IV/V ([place]): Followed by nephrology.  Likely she will need dialysis during  this hospitalization.  With IV fluids, renal function slightly improved today, but it appears to have made her heart failure worse.    Morbid obesity [facility]): Patient criteria BMI greater than 40    HTN (hypertension): Blood pressures have been somewhat soft during his hospitalization.    DM (diabetes mellitus), type 2 with renal complications (HCC) Acute on chronic CHF (congestive heart failure) ([place]) causing acute respiratory failure with hypoxia: Despite fluid resuscitation, have been limited due to CHF.  Fortunately ejection fraction preserved.  Patient received fluids yesterday and today leading to increased dyspnea and requiring oxygen.  Will humidify O2 to improve her nasal passages.    Anemia, unspecified: Even with hydration, creatinine has worsened but hemoglobin continued to drop.  There is a concern that she may be bleeding.  However Hemoccult negative and hemoglobin is low 7.6 on morning of 4/2, increased to 8.5 without intervention by afternoon.  Which may be from anemia from renal disease or hemoconcentration.  With fluids, hemoglobin dropped to 7.6.   Code Status: Full code  Family Communication: Discussed with son at the bedside on 4/2  Disposition Plan: Continue inpatient, monitor renal function and patient will likely need dialysis during this hospitalization   Consultants:  Nephrology  Procedures:  None  Antimicrobials:  IV Rocephin 3/31-4/2  Keflex 4/2-present  DVT prophylaxis: SCDs   Objective: Vitals:   01/09/20 1243 01/09/20 1325  BP:  (!) 118/58  Pulse:    Resp:    Temp:    SpO2: 92%     Intake/Output Summary (Last 24 hours) at 01/09/2020 1659 Last data filed at 01/09/2020 1515 Gross per 24 hour  Intake  1929.7 ml  Output 1025 ml  Net 904.7 ml   Filed Weights   01/05/20 1828 01/06/20 0158  Weight: 113.4 kg 113.4 kg   Body mass index is 44.29 kg/m.  Exam:   General: Alert and oriented x3, fatigued  HEENT: Normocephalic and atraumatic,  mucous membranes are dry  Neck: Thick, narrow airway  Cardiovascular: Regular rate and rhythm, S1-S2  Respiratory: Decreased breath sounds throughout secondary to body habitus  Abdomen: Soft, obese, nontender, positive bowel sounds  Musculoskeletal: No clubbing or cyanosis, 1+ pitting edema bilaterally  Skin: No skin breaks, tears or lesions  Psychiatry: Appropriate, no evidence of psychoses  Neuro: No focal deficits   Data Reviewed: CBC: Recent Labs  Lab 01/05/20 1901 01/05/20 1901 01/06/20 0243 01/07/20 0327 01/08/20 0347 01/08/20 1355 01/09/20 0451  WBC 21.5*  --  24.3* 16.2* 10.5  --  8.2  HGB 8.9*   < > 9.6* 8.2* 7.6* 8.5* 7.6*  HCT 28.8*   < > 31.1* 25.7* 24.4* 27.5* 24.1*  MCV 90.0  --  90.4 86.5 89.1  --  88.9  PLT 265  --  289 245 246  --  261   < > = values in this interval not displayed.   Basic Metabolic Panel: Recent Labs  Lab 01/05/20 1901 01/06/20 0243 01/07/20 0327 01/08/20 0347 01/09/20 0451  NA 132* 130* 131* 133* 135  K 3.9 3.8 4.1 4.7 4.5  CL 87* 86* 86* 87* 89*  CO2 '28 29 30 31 ' 32  GLUCOSE 311* 277* 240* 196* 144*  BUN 105* 111* 118* 130* 131*  CREATININE 3.90* 3.93* 4.44* 4.95* 4.49*  CALCIUM 8.8* 9.0 8.7* 8.5* 8.3*  MG  --   --  1.6* 1.8  --   PHOS  --   --   --  5.9*  --    GFR: Estimated Creatinine Clearance: 14.3 mL/min (A) (by C-G formula based on SCr of 4.49 mg/dL (H)). Liver Function Tests: Recent Labs  Lab 01/05/20 1901 01/08/20 0347 01/09/20 0451  AST '30 28 20  ' ALT '17 19 17  ' ALKPHOS 57 54 51  BILITOT 0.3 0.6 0.7  PROT 6.2* 5.8* 5.8*  ALBUMIN 2.9* 2.3* 2.2*   Recent Labs  Lab 01/05/20 1901  LIPASE 25   No results for input(s): AMMONIA in the last 168 hours. Coagulation Profile: Recent Labs  Lab 01/06/20 0153  INR 1.2   Cardiac Enzymes: Recent Labs  Lab 01/08/20 0347  CKTOTAL 81   BNP (last 3 results) No results for input(s): PROBNP in the last 8760 hours. HbA1C: No results for input(s): HGBA1C in  the last 72 hours. CBG: Recent Labs  Lab 01/08/20 1638 01/08/20 1833 01/08/20 2109 01/09/20 0757 01/09/20 1224  GLUCAP 154* 191* 215* 125* 152*   Lipid Profile: No results for input(s): CHOL, HDL, LDLCALC, TRIG, CHOLHDL, LDLDIRECT in the last 72 hours. Thyroid Function Tests: No results for input(s): TSH, T4TOTAL, FREET4, T3FREE, THYROIDAB in the last 72 hours. Anemia Panel: Recent Labs    01/08/20 1355  TIBC 351  IRON 13*   Urine analysis:    Component Value Date/Time   COLORURINE AMBER (A) 01/05/2020 2110   APPEARANCEUR CLOUDY (A) 01/05/2020 2110   LABSPEC 1.013 01/05/2020 2110   PHURINE 5.0 01/05/2020 2110   GLUCOSEU NEGATIVE 01/05/2020 2110   HGBUR LARGE (A) 01/05/2020 2110   BILIRUBINUR NEGATIVE 01/05/2020 2110   KETONESUR NEGATIVE 01/05/2020 2110   PROTEINUR 100 (A) 01/05/2020 2110   UROBILINOGEN 0.2 08/04/2014 1409  NITRITE NEGATIVE 01/05/2020 2110   LEUKOCYTESUR MODERATE (A) 01/05/2020 2110   Sepsis Labs: '@LABRCNTIP' (procalcitonin:4,lacticidven:4)  ) Recent Results (from the past 240 hour(s))  Blood Culture (routine x 2)     Status: Abnormal   Collection Time: 01/06/20  1:53 AM   Specimen: BLOOD  Result Value Ref Range Status   Specimen Description BLOOD RIGHT ARM  Final   Special Requests   Final    BOTTLES DRAWN AEROBIC AND ANAEROBIC Blood Culture adequate volume   Culture  Setup Time   Final    GRAM NEGATIVE RODS IN BOTH AEROBIC AND ANAEROBIC BOTTLES CRITICAL RESULT CALLED TO, READ BACK BY AND VERIFIED WITH: Starlyn Skeans PharmD 16:20 01/06/20 ([email or web address]) Performed at [facility], [place] [address], [place], [place] [address]    Culture ESCHERICHIA COLI (A)  Final   Report Status 01/08/2020 FINAL  Final   Organism ID, Bacteria ESCHERICHIA COLI  Final      Susceptibility   Escherichia coli - MIC*    AMPICILLIN <=2 SENSITIVE Sensitive     CEFAZOLIN <=4 SENSITIVE Sensitive     CEFEPIME <=0.12 SENSITIVE Sensitive     CEFTAZIDIME <=1 SENSITIVE  Sensitive     CEFTRIAXONE <=0.25 SENSITIVE Sensitive     CIPROFLOXACIN <=0.25 SENSITIVE Sensitive     GENTAMICIN <=1 SENSITIVE Sensitive     IMIPENEM <=0.25 SENSITIVE Sensitive     TRIMETH/SULFA <=20 SENSITIVE Sensitive     AMPICILLIN/SULBACTAM <=2 SENSITIVE Sensitive     PIP/TAZO <=4 SENSITIVE Sensitive     * ESCHERICHIA COLI  Blood Culture ID Panel (Reflexed)     Status: Abnormal   Collection Time: 01/06/20  1:53 AM  Result Value Ref Range Status   Enterococcus species NOT DETECTED NOT DETECTED Final   Listeria monocytogenes NOT DETECTED NOT DETECTED Final   Staphylococcus species NOT DETECTED NOT DETECTED Final   Staphylococcus aureus (BCID) NOT DETECTED NOT DETECTED Final   Streptococcus species NOT DETECTED NOT DETECTED Final   Streptococcus agalactiae NOT DETECTED NOT DETECTED Final   Streptococcus pneumoniae NOT DETECTED NOT DETECTED Final   Streptococcus pyogenes NOT DETECTED NOT DETECTED Final   Acinetobacter baumannii NOT DETECTED NOT DETECTED Final   Enterobacteriaceae species DETECTED (A) NOT DETECTED Final    Comment: Enterobacteriaceae represent a large family of gram-negative bacteria, not a single organism. CRITICAL RESULT CALLED TO, READ BACK BY AND VERIFIED WITH: Starlyn Skeans PharmD 16:20 01/06/20 ([email or web address])    Enterobacter cloacae complex NOT DETECTED NOT DETECTED Final   Escherichia coli DETECTED (A) NOT DETECTED Final    Comment: CRITICAL RESULT CALLED TO, READ BACK BY AND VERIFIED WITH: Starlyn Skeans PharmD 16:20 01/06/20 ([email or web address])    Klebsiella oxytoca NOT DETECTED NOT DETECTED Final   Klebsiella pneumoniae NOT DETECTED NOT DETECTED Final   Proteus species NOT DETECTED NOT DETECTED Final   Serratia marcescens NOT DETECTED NOT DETECTED Final   Carbapenem resistance NOT DETECTED NOT DETECTED Final   Haemophilus influenzae NOT DETECTED NOT DETECTED Final   Neisseria meningitidis NOT DETECTED NOT DETECTED Final   Pseudomonas aeruginosa NOT DETECTED NOT DETECTED Final    Candida albicans NOT DETECTED NOT DETECTED Final   Candida glabrata NOT DETECTED NOT DETECTED Final   Candida krusei NOT DETECTED NOT DETECTED Final   Candida parapsilosis NOT DETECTED NOT DETECTED Final   Candida tropicalis NOT DETECTED NOT DETECTED Final    Comment: Performed at [facility], [place] [address], [place], [place] [address]  SARS CORONAVIRUS 2 (TAT 6-24 HRS) Nasopharyngeal Nasopharyngeal  Swab     Status: None   Collection Time: 01/06/20  1:57 AM   Specimen: Nasopharyngeal Swab  Result Value Ref Range Status   SARS Coronavirus 2 NEGATIVE NEGATIVE Final    Comment: (NOTE) SARS-CoV-2 target nucleic acids are NOT DETECTED. The SARS-CoV-2 RNA is generally detectable in upper and lower respiratory specimens during the acute phase of infection. Negative results do not preclude SARS-CoV-2 infection, do not rule out co-infections with other pathogens, and should not be used as the sole basis for treatment or other patient management decisions. Negative results must be combined with clinical observations, patient history, and epidemiological information. The expected result is Negative. Fact Sheet for Patients: SugarRoll.be Fact Sheet for Healthcare Providers: https://www.woods-mathews.com/ This test is not yet approved or cleared by the [place] FDA and  has been authorized for detection and/or diagnosis of SARS-CoV-2 by FDA under an Emergency Use Authorization (EUA). This EUA will remain  in effect (meaning this test can be used) for the duration of the COVID-19 declaration under Section 56 4(b)(1) of the Act, 21 U.S.C. section 360bbb-3(b)(1), unless the authorization is terminated or revoked sooner. Performed at [facility], [place] [address], [place], [place] [address]   Blood Culture (routine x 2)     Status: Abnormal   Collection Time: 01/06/20  2:42 AM   Specimen: BLOOD  Result Value Ref Range Status   Specimen  Description BLOOD LEFT ARM  Final   Special Requests   Final    BOTTLES DRAWN AEROBIC AND ANAEROBIC Blood Culture adequate volume   Culture  Setup Time   Final    GRAM NEGATIVE RODS IN BOTH AEROBIC AND ANAEROBIC BOTTLES CRITICAL VALUE NOTED.  VALUE IS CONSISTENT WITH PREVIOUSLY REPORTED AND CALLED VALUE.    Culture (A)  Final    ESCHERICHIA COLI SUSCEPTIBILITIES PERFORMED ON PREVIOUS CULTURE WITHIN THE LAST 5 DAYS. Performed at [facility], [place] [address] [place], [place] [address]    Report Status 01/08/2020 FINAL  Final  Urine culture     Status: Abnormal   Collection Time: 01/06/20  4:04 PM   Specimen: In/Out Cath Urine  Result Value Ref Range Status   Specimen Description IN/OUT CATH URINE  Final   Special Requests   Final    NONE Performed at [facility], [place] [address], [place], [place] [address]    Culture 5,000 COLONIES/mL ESCHERICHIA COLI (A)  Final   Report Status 01/08/2020 FINAL  Final   Organism ID, Bacteria ESCHERICHIA COLI (A)  Final      Susceptibility   Escherichia coli - MIC*    AMPICILLIN <=2 SENSITIVE Sensitive     CEFAZOLIN <=4 SENSITIVE Sensitive     CEFTRIAXONE <=0.25 SENSITIVE Sensitive     CIPROFLOXACIN <=0.25 SENSITIVE Sensitive     GENTAMICIN <=1 SENSITIVE Sensitive     IMIPENEM <=0.25 SENSITIVE Sensitive     NITROFURANTOIN <=16 SENSITIVE Sensitive     TRIMETH/SULFA <=20 SENSITIVE Sensitive     AMPICILLIN/SULBACTAM <=2 SENSITIVE Sensitive     PIP/TAZO <=4 SENSITIVE Sensitive     * 5,000 COLONIES/mL ESCHERICHIA COLI      Studies: VAS [name] UPPER EXT VEIN MAPPING (PRE-OP AVF)  Result Date: 01/09/2020 UPPER EXTREMITY VEIN MAPPING  Indications: Pre-Dialysis access. Comparison Study: No prior study Performing Technologist: Sharion Dove RVS  Examination Guidelines: A complete evaluation includes B-mode imaging, spectral Doppler, color Doppler, and power Doppler as needed of all accessible portions of each vessel. Bilateral testing  is  considered an integral part of a complete examination. Limited examinations for reoccurring indications may be performed as noted. +-----------------+-------------+----------+--------+ Right Cephalic   Diameter (cm)Depth (cm)Findings +-----------------+-------------+----------+--------+ Prox upper arm       0.32        0.27            +-----------------+-------------+----------+--------+ Mid upper arm        0.26        0.28   tortuous +-----------------+-------------+----------+--------+ Dist upper arm       0.26        0.34   tortuous +-----------------+-------------+----------+--------+ Antecubital fossa    0.20        0.22   tortuous +-----------------+-------------+----------+--------+ Prox forearm         0.26        0.14   thrombus +-----------------+-------------+----------+--------+ Mid forearm          0.33        0.85            +-----------------+-------------+----------+--------+ Wrist                0.25        0.47            +-----------------+-------------+----------+--------+ +-----------------+-------------+----------+---------+ Right Basilic    Diameter (cm)Depth (cm)Findings  +-----------------+-------------+----------+---------+ Mid upper arm        0.72        2.80    origin   +-----------------+-------------+----------+---------+ Dist upper arm       0.53        2.45             +-----------------+-------------+----------+---------+ Antecubital fossa    0.37        1.18             +-----------------+-------------+----------+---------+ Prox forearm         0.28        0.72   branching +-----------------+-------------+----------+---------+ Mid forearm          0.24        0.53   branching +-----------------+-------------+----------+---------+ Wrist                0.25        0.23             +-----------------+-------------+----------+---------+ +-----------------+-------------+----------+--------+ Left Cephalic     Diameter (cm)Depth (cm)Findings +-----------------+-------------+----------+--------+ Prox upper arm       0.17        0.16            +-----------------+-------------+----------+--------+ Mid upper arm        0.23        0.24   tortuous +-----------------+-------------+----------+--------+ Dist upper arm       0.21        0.23   tortuous +-----------------+-------------+----------+--------+ Antecubital fossa    0.20        0.13            +-----------------+-------------+----------+--------+ Prox forearm         0.26        0.88            +-----------------+-------------+----------+--------+ Mid forearm          0.19        0.18            +-----------------+-------------+----------+--------+ Dist forearm         0.24        0.18            +-----------------+-------------+----------+--------+  Wrist                0.16        0.25            +-----------------+-------------+----------+--------+ +-----------------+-------------+----------+---------+ Left Basilic     Diameter (cm)Depth (cm)Findings  +-----------------+-------------+----------+---------+ Prox upper arm       0.53        2.85    origin   +-----------------+-------------+----------+---------+ Mid upper arm        0.46        2.50             +-----------------+-------------+----------+---------+ Dist upper arm       0.53        2.63   branching +-----------------+-------------+----------+---------+ Antecubital fossa    0.32        0.69             +-----------------+-------------+----------+---------+ Prox forearm         0.36        0.34   branching +-----------------+-------------+----------+---------+ Mid forearm          0.31        0.82   branching +-----------------+-------------+----------+---------+ Wrist                0.26        0.47             +-----------------+-------------+----------+---------+ *See table(s) above for measurements and observations.   Diagnosing physician:    Preliminary     Scheduled Meds: . allopurinol  100 mg Oral Daily  . bisacodyl  10 mg Rectal Daily  . carvedilol  12.5 mg Oral BID  . cephALEXin  500 mg Oral Q8H  . cholecalciferol  2,000 Units Oral Daily  . cinacalcet  30 mg Oral Q breakfast  . clopidogrel  75 mg Oral Daily  . fenofibrate  160 mg Oral Daily  . furosemide  80 mg Intravenous Q12H  . heparin  5,000 Units Subcutaneous Q8H  . hydrOXYzine  25 mg Oral BID  . insulin aspart  0-15 Units Subcutaneous TID WC  . insulin aspart  6 Units Subcutaneous TID WC  . insulin NPH Human  20 Units Subcutaneous BID AC & HS  . levothyroxine  150 mcg Oral Q0600  . magnesium oxide  400 mg Oral BID  . mirabegron ER  25 mg Oral Daily  . mometasone-formoterol  2 puff Inhalation BID  . multivitamin with minerals  1 tablet Oral Daily  . pantoprazole  40 mg Oral Daily  . polyethylene glycol  17 g Oral Daily  . potassium chloride  20 mEq Oral Q M,W,F  . simvastatin  20 mg Oral QHS  . tiZANidine  4 mg Oral BID    Continuous Infusions: . ferric gluconate (FERRLECIT/NULECIT) IV Stopped (01/09/20 1515)     LOS: 2 days     Annita Brod, MD [facility]   01/09/2020, 4:59 PM

## 2020-01-09 NOTE — Progress Notes (Signed)
VASCULAR LAB PRELIMINARY  PRELIMINARY  PRELIMINARY  PRELIMINARY  Upper extremity vein mapping completed.    Preliminary report:  See CV proc for preliminary results.   Tyrique Sporn, RVT 01/09/2020, 12:04 PM

## 2020-01-09 NOTE — Plan of Care (Signed)
  Problem: Activity: Goal: Risk for activity intolerance will decrease Outcome: Progressing   Problem: Nutrition: Goal: Adequate nutrition will be maintained Outcome: Progressing   Problem: Coping: Goal: Level of anxiety will decrease Outcome: Progressing   Problem: Elimination: Goal: Will not experience complications related to bowel motility Outcome: Progressing Goal: Will not experience complications related to urinary retention Outcome: Progressing   Problem: Pain Managment: Goal: General experience of comfort will improve Outcome: Progressing Note: PRN oxy given x1 throughout shift. Up to chair.

## 2020-01-09 NOTE — Progress Notes (Addendum)
[facility] ROUNDING NOTE   Subjective:   [name] [name] is a 69 y.o. female.  With a history of chronic kidney disease stage IV baseline serum creatinine appears to be between 3 to 4 mg/dL.  That looks to have been some progression of her renal disease in the records over the past 6 to 12 months.  She does have a history of diabetes mellitus type 2 she also has a history of chronic congestive heart failure.  2D echo 03/18/2019 showed ejection fraction of 45 to 55%.  She presented with urosepsis with E. coli bacteremia  Carvedilol 12.5 mg twice daily, allopurinol 100 mg daily vitamin D 2000 units daily Sensipar 30 mg daily Plavix 75 mg daily fenofibrate 160 mg daily insulin sliding scale, levothyroxine 150 mcg daily magnesium oxide 400 mg daily Myrbetriq 25 mg, renal vitamins 1 daily Protonix 40 mg daily, simvastatin 20 mg daily potassium 20 mEq daily   Rocephin 2 g every 24 hours  Urine output 800 cc 01/09/2020  Blood pressure 115/55 pulse 73 temperature 97.7 O2 sats 94% room air  Sodium 135 potassium 4.5 chloride 89 CO2 32 creatinine 4.49 BUN 131 calcium 8.3 albumin 2.2 liver enzymes within normal range iron saturation 4% hemoglobin 7.6  IV NS 125 cc /hr      Objective:  Vital signs in last 24 hours:  Temp:  [97.7 F (36.5 C)-98.2 F (36.8 C)] 97.7 F (36.5 C) (04/03 0542) Pulse Rate:  [71-89] 75 (04/03 0724) Resp:  [15-18] 18 (04/03 0754) BP: (104-132)/(53-60) 111/55 (04/03 0542) SpO2:  [94 %-100 %] 94 % (04/03 0943)  Weight change:  Filed Weights   01/05/20 1828 01/06/20 0158  Weight: 113.4 kg 113.4 kg    Intake/Output: I/O last 3 completed shifts: In: 1109 [P.O.:420; I.V.:689] Out: 400 [Urine:400]   Intake/Output this shift:  Total I/O In: 607.6 [P.O.:120; I.V.:487.6] Out: 425 [Urine:425]  General: Awake oriented not in any distress 1 L nasal cannula HEENT: Oral mucosa was moist Eyes: Pupils round equal reactive no icterus no pallor Neck: Supple  JVP mildly increased  Heart: Regular rate and rhythm no murmurs rubs or gallops Lungs: Clear to auscultation bibasilar rales  Abdomen: Soft nontender bowel sounds present Extremities: peripheral edema lateral Skin: Warm without rashes Neuro: Grossly intact   Basic Metabolic Panel: Recent Labs  Lab 01/05/20 1901 01/05/20 1901 01/06/20 0243 01/06/20 0243 01/07/20 0327 01/08/20 0347 01/09/20 0451  NA 132*  --  130*  --  131* 133* 135  K 3.9  --  3.8  --  4.1 4.7 4.5  CL 87*  --  86*  --  86* 87* 89*  CO2 28  --  29  --  30 31 32  GLUCOSE 311*  --  277*  --  240* 196* 144*  BUN 105*  --  111*  --  118* 130* 131*  CREATININE 3.90*  --  3.93*  --  4.44* 4.95* 4.49*  CALCIUM 8.8*   < > 9.0   < > 8.7* 8.5* 8.3*  MG  --   --   --   --  1.6* 1.8  --   PHOS  --   --   --   --   --  5.9*  --    < > = values in this interval not displayed.    Liver Function Tests: Recent Labs  Lab 01/05/20 1901 01/08/20 0347 01/09/20 0451  AST 30 28 20   ALT 17 19 17   ALKPHOS  57 54 51  BILITOT 0.3 0.6 0.7  PROT 6.2* 5.8* 5.8*  ALBUMIN 2.9* 2.3* 2.2*   Recent Labs  Lab 01/05/20 1901  LIPASE 25   No results for input(s): AMMONIA in the last 168 hours.  CBC: Recent Labs  Lab 01/05/20 1901 01/05/20 1901 01/06/20 0243 01/07/20 0327 01/08/20 0347 01/08/20 1355 01/09/20 0451  WBC 21.5*  --  24.3* 16.2* 10.5  --  8.2  HGB 8.9*   < > 9.6* 8.2* 7.6* 8.5* 7.6*  HCT 28.8*   < > 31.1* 25.7* 24.4* 27.5* 24.1*  MCV 90.0  --  90.4 86.5 89.1  --  88.9  PLT 265  --  289 245 246  --  261   < > = values in this interval not displayed.    Cardiac Enzymes: Recent Labs  Lab 01/08/20 0347  CKTOTAL 81    BNP: Invalid input(s): POCBNP  CBG: Recent Labs  Lab 01/08/20 1157 01/08/20 1638 01/08/20 1833 01/08/20 2109 01/09/20 0757  GLUCAP 138* 154* 191* 215* 125*    Microbiology: Results for orders placed or performed during the hospital encounter of 01/05/20  Blood Culture (routine x  2)     Status: Abnormal   Collection Time: 01/06/20  1:53 AM   Specimen: BLOOD  Result Value Ref Range Status   Specimen Description BLOOD RIGHT ARM  Final   Special Requests   Final    BOTTLES DRAWN AEROBIC AND ANAEROBIC Blood Culture adequate volume   Culture  Setup Time   Final    GRAM NEGATIVE RODS IN BOTH AEROBIC AND ANAEROBIC BOTTLES CRITICAL RESULT CALLED TO, READ BACK BY AND VERIFIED WITH: Starlyn Skeans PharmD 16:20 01/06/20 ([email or web address]) Performed at [facility], [address] [place], [place] [address]    Culture ESCHERICHIA COLI (A)  Final   Report Status 01/08/2020 FINAL  Final   Organism ID, Bacteria ESCHERICHIA COLI  Final      Susceptibility   Escherichia coli - MIC*    AMPICILLIN <=2 SENSITIVE Sensitive     CEFAZOLIN <=4 SENSITIVE Sensitive     CEFEPIME <=0.12 SENSITIVE Sensitive     CEFTAZIDIME <=1 SENSITIVE Sensitive     CEFTRIAXONE <=0.25 SENSITIVE Sensitive     CIPROFLOXACIN <=0.25 SENSITIVE Sensitive     GENTAMICIN <=1 SENSITIVE Sensitive     IMIPENEM <=0.25 SENSITIVE Sensitive     TRIMETH/SULFA <=20 SENSITIVE Sensitive     AMPICILLIN/SULBACTAM <=2 SENSITIVE Sensitive     PIP/TAZO <=4 SENSITIVE Sensitive     * ESCHERICHIA COLI  Blood Culture ID Panel (Reflexed)     Status: Abnormal   Collection Time: 01/06/20  1:53 AM  Result Value Ref Range Status   Enterococcus species NOT DETECTED NOT DETECTED Final   Listeria monocytogenes NOT DETECTED NOT DETECTED Final   Staphylococcus species NOT DETECTED NOT DETECTED Final   Staphylococcus aureus (BCID) NOT DETECTED NOT DETECTED Final   Streptococcus species NOT DETECTED NOT DETECTED Final   Streptococcus agalactiae NOT DETECTED NOT DETECTED Final   Streptococcus pneumoniae NOT DETECTED NOT DETECTED Final   Streptococcus pyogenes NOT DETECTED NOT DETECTED Final   Acinetobacter baumannii NOT DETECTED NOT DETECTED Final   Enterobacteriaceae species DETECTED (A) NOT DETECTED Final    Comment: Enterobacteriaceae  represent a large family of gram-negative bacteria, not a single organism. CRITICAL RESULT CALLED TO, READ BACK BY AND VERIFIED WITH: Starlyn Skeans PharmD 16:20 01/06/20 ([email or web address])    Enterobacter cloacae complex NOT DETECTED NOT DETECTED Final   Escherichia  coli DETECTED (A) NOT DETECTED Final    Comment: CRITICAL RESULT CALLED TO, READ BACK BY AND VERIFIED WITH: Starlyn Skeans PharmD 16:20 01/06/20 ([email or web address])    Klebsiella oxytoca NOT DETECTED NOT DETECTED Final   Klebsiella pneumoniae NOT DETECTED NOT DETECTED Final   Proteus species NOT DETECTED NOT DETECTED Final   Serratia marcescens NOT DETECTED NOT DETECTED Final   Carbapenem resistance NOT DETECTED NOT DETECTED Final   Haemophilus influenzae NOT DETECTED NOT DETECTED Final   Neisseria meningitidis NOT DETECTED NOT DETECTED Final   Pseudomonas aeruginosa NOT DETECTED NOT DETECTED Final   Candida albicans NOT DETECTED NOT DETECTED Final   Candida glabrata NOT DETECTED NOT DETECTED Final   Candida krusei NOT DETECTED NOT DETECTED Final   Candida parapsilosis NOT DETECTED NOT DETECTED Final   Candida tropicalis NOT DETECTED NOT DETECTED Final    Comment: Performed at [facility], [place] [address], [place], [place] [address]  SARS CORONAVIRUS 2 (TAT 6-24 HRS) Nasopharyngeal Nasopharyngeal Swab     Status: None   Collection Time: 01/06/20  1:57 AM   Specimen: Nasopharyngeal Swab  Result Value Ref Range Status   SARS Coronavirus 2 NEGATIVE NEGATIVE Final    Comment: (NOTE) SARS-CoV-2 target nucleic acids are NOT DETECTED. The SARS-CoV-2 RNA is generally detectable in upper and lower respiratory specimens during the acute phase of infection. Negative results do not preclude SARS-CoV-2 infection, do not rule out co-infections with other pathogens, and should not be used as the sole basis for treatment or other patient management decisions. Negative results must be combined with clinical observations, patient history, and epidemiological  information. The expected result is Negative. Fact Sheet for Patients: SugarRoll.be Fact Sheet for Healthcare Providers: https://www.woods-mathews.com/ This test is not yet approved or cleared by the [place] FDA and  has been authorized for detection and/or diagnosis of SARS-CoV-2 by FDA under an Emergency Use Authorization (EUA). This EUA will remain  in effect (meaning this test can be used) for the duration of the COVID-19 declaration under Section 56 4(b)(1) of the Act, 21 U.S.C. section 360bbb-3(b)(1), unless the authorization is terminated or revoked sooner. Performed at [facility], [place] [address], [place], [place] [address]   Blood Culture (routine x 2)     Status: Abnormal   Collection Time: 01/06/20  2:42 AM   Specimen: BLOOD  Result Value Ref Range Status   Specimen Description BLOOD LEFT ARM  Final   Special Requests   Final    BOTTLES DRAWN AEROBIC AND ANAEROBIC Blood Culture adequate volume   Culture  Setup Time   Final    GRAM NEGATIVE RODS IN BOTH AEROBIC AND ANAEROBIC BOTTLES CRITICAL VALUE NOTED.  VALUE IS CONSISTENT WITH PREVIOUSLY REPORTED AND CALLED VALUE.    Culture (A)  Final    ESCHERICHIA COLI SUSCEPTIBILITIES PERFORMED ON PREVIOUS CULTURE WITHIN THE LAST 5 DAYS. Performed at [facility], [place] [address], [place], [place] [address]    Report Status 01/08/2020 FINAL  Final  Urine culture     Status: Abnormal   Collection Time: 01/06/20  4:04 PM   Specimen: In/Out Cath Urine  Result Value Ref Range Status   Specimen Description IN/OUT CATH URINE  Final   Special Requests   Final    NONE Performed at [facility], [place] [address], [place], [place] [address]    Culture 5,000 COLONIES/mL ESCHERICHIA COLI (A)  Final   Report Status 01/08/2020 FINAL  Final   Organism  ID, Bacteria ESCHERICHIA COLI (A)  Final      Susceptibility   Escherichia coli - MIC*    AMPICILLIN <=2 SENSITIVE  Sensitive     CEFAZOLIN <=4 SENSITIVE Sensitive     CEFTRIAXONE <=0.25 SENSITIVE Sensitive     CIPROFLOXACIN <=0.25 SENSITIVE Sensitive     GENTAMICIN <=1 SENSITIVE Sensitive     IMIPENEM <=0.25 SENSITIVE Sensitive     NITROFURANTOIN <=16 SENSITIVE Sensitive     TRIMETH/SULFA <=20 SENSITIVE Sensitive     AMPICILLIN/SULBACTAM <=2 SENSITIVE Sensitive     PIP/TAZO <=4 SENSITIVE Sensitive     * 5,000 COLONIES/mL ESCHERICHIA COLI    Coagulation Studies: No results for input(s): LABPROT, INR in the last 72 hours.  Urinalysis: No results for input(s): COLORURINE, LABSPEC, PHURINE, GLUCOSEU, HGBUR, BILIRUBINUR, KETONESUR, PROTEINUR, UROBILINOGEN, NITRITE, LEUKOCYTESUR in the last 72 hours.  Invalid input(s): APPERANCEUR    Imaging: DG CHEST PORT 1 VIEW  Result Date: 01/07/2020 CLINICAL DATA:  Shortness of breath EXAM: PORTABLE CHEST 1 VIEW COMPARISON:  01/06/2020 FINDINGS: The heart size and mediastinal contours are stable. Atherosclerotic calcification of the aortic knob. Unchanged mildly prominent interstitial markings. No focal airspace consolidation, pleural effusion, or pneumothorax. IMPRESSION: No active disease.  Stable exam. Electronically Signed   By: Davina Poke D.O.   On: 01/07/2020 11:32     Medications:   . sodium chloride 125 mL/hr at 01/09/20 0944   . allopurinol  100 mg Oral Daily  . bisacodyl  10 mg Rectal Daily  . carvedilol  12.5 mg Oral BID  . cephALEXin  500 mg Oral Q8H  . cholecalciferol  2,000 Units Oral Daily  . cinacalcet  30 mg Oral Q breakfast  . clopidogrel  75 mg Oral Daily  . fenofibrate  160 mg Oral Daily  . heparin  5,000 Units Subcutaneous Q8H  . hydrOXYzine  25 mg Oral BID  . insulin aspart  0-15 Units Subcutaneous TID WC  . insulin aspart  6 Units Subcutaneous TID WC  . insulin NPH Human  20 Units Subcutaneous BID AC & HS  . levothyroxine  150 mcg Oral Q0600  . magnesium oxide  400 mg Oral BID  . mirabegron ER  25 mg Oral Daily  .  mometasone-formoterol  2 puff Inhalation BID  . multivitamin with minerals  1 tablet Oral Daily  . pantoprazole  40 mg Oral Daily  . polyethylene glycol  17 g Oral Daily  . potassium chloride  20 mEq Oral Q M,W,F  . simvastatin  20 mg Oral QHS  . tiZANidine  4 mg Oral BID   acetaminophen **OR** acetaminophen, albuterol, fluticasone, ondansetron **OR** ondansetron (ZOFRAN) IV, oxyCODONE-acetaminophen **AND** oxyCODONE, polyvinyl alcohol  Assessment/ Plan:  1.Acute on chronic kidney disease with worsening azotemia this is being in the setting of aggressive diuresis with a combination of torsemide and metolazone.  There is no obstruction on CT scan.  Patient appears to be making urine although is clearly volume overloaded.  Urinalysis is positive for pyuria with a positive urine culture for E. coli.  This appears to be progression of her renal disease.  I believe that she will need to start dialysis in the near future.  She has azotemia without the use of steroids or GI bleed.  This in the setting of her congestive heart failure with systolic dysfunction is troubling for cardiorenal syndrome.  Will obtain vein mapping and involve vein and vascular surgery to see if we can place vascular access.  It is likely  that she will need dialysis this admission.  Would avoid nephrotoxins ACE inhibitor's ARB use nonsteroidal anti-inflammatory drugs.  We will try to avoid the use of IV contrast  2. Hypertension/volume  -clearly has volume overload received IV Lasix 01/07/2020.  Torsemide has been discontinued 01/06/2020. Would stop IVF and diurese today  Start lasix 80 mg IV q 12 hours  3.  Anemia low iron saturation start IV iron start darbepoetin 100 mcg weekly. 4.  Bones will check PTH and follow renal panel for evaluation of phosphorus 5.  Hyponatremia.  Probably as result of renal disease but also could be in the setting of Zaroxolyn.. 6.  Diabetes mellitus as per primary team. 7.  E. coli UTI currently on  therapy with IV antibiotics   LOS: 2 Sherril Croon @TODAY @9 :54 AM

## 2020-01-10 LAB — COMPREHENSIVE METABOLIC PANEL
ALT: 17 U/L (ref 0–44)
AST: 23 U/L (ref 15–41)
Albumin: 2.4 g/dL — ABNORMAL LOW (ref 3.5–5.0)
Alkaline Phosphatase: 51 U/L (ref 38–126)
Anion gap: 17 — ABNORMAL HIGH (ref 5–15)
BUN: 127 mg/dL — ABNORMAL HIGH (ref 8–23)
CO2: 29 mmol/L (ref 22–32)
Calcium: 8.6 mg/dL — ABNORMAL LOW (ref 8.9–10.3)
Chloride: 94 mmol/L — ABNORMAL LOW (ref 98–111)
Creatinine, Ser: 3.84 mg/dL — ABNORMAL HIGH (ref 0.44–1.00)
GFR calc Af Amer: 13 mL/min — ABNORMAL LOW (ref >60)
GFR calc non Af Amer: 11 mL/min — ABNORMAL LOW (ref >60)
Glucose, Bld: 77 mg/dL (ref 70–99)
Potassium: 4.5 mmol/L (ref 3.5–5.1)
Sodium: 140 mmol/L (ref 135–145)
Total Bilirubin: 0.4 mg/dL (ref 0.3–1.2)
Total Protein: 6.3 g/dL — ABNORMAL LOW (ref 6.5–8.1)

## 2020-01-10 LAB — CBC
HCT: 25.5 % — ABNORMAL LOW (ref 36.0–46.0)
Hemoglobin: 7.7 g/dL — ABNORMAL LOW (ref 12.0–15.0)
MCH: 27.1 pg (ref 26.0–34.0)
MCHC: 30.2 g/dL (ref 30.0–36.0)
MCV: 89.8 fL (ref 80.0–100.0)
Platelets: 290 10*3/uL (ref 150–400)
RBC: 2.84 MIL/uL — ABNORMAL LOW (ref 3.87–5.11)
RDW: 16.7 % — ABNORMAL HIGH (ref 11.5–15.5)
WBC: 7 10*3/uL (ref 4.0–10.5)
nRBC: 0.3 % — ABNORMAL HIGH (ref 0.0–0.2)

## 2020-01-10 LAB — GLUCOSE, CAPILLARY
Glucose-Capillary: 73 mg/dL (ref 70–99)
Glucose-Capillary: 116 mg/dL — ABNORMAL HIGH (ref 70–99)
Glucose-Capillary: 182 mg/dL — ABNORMAL HIGH (ref 70–99)
Glucose-Capillary: 153 mg/dL — ABNORMAL HIGH (ref 70–99)

## 2020-01-10 NOTE — Plan of Care (Signed)
  Problem: Pain Managment: Goal: General experience of comfort will improve Outcome: Progressing Note: PRN pain medication as appropriate   Problem: Safety: Goal: Ability to remain free from injury will improve Outcome: Progressing   Problem: Skin Integrity: Goal: Risk for impaired skin integrity will decrease Outcome: Progressing

## 2020-01-10 NOTE — Progress Notes (Signed)
Unable to complete fecal occult due to urine mixing with BM.

## 2020-01-10 NOTE — Progress Notes (Signed)
PROGRESS NOTE  JAMESA TEDRICK ZMO:[id number] DOB: [birth year] DOA: 01/05/2020 PCP: [name], MD  HPI/Recap of past 80 hours: 69 year old female with past medical history of morbid obesity, diabetes mellitus, chronic diastolic heart failure and stage IV/V chronic kidney disease admitted on early morning of 3/31 for UTI with secondary sepsis.  Following admission, patient's blood cultures grew out E. coli as did her urine.  Patient given IV fluids and antibiotics.  Over the next few days, patient's white count has dramatically improved going from 24 on admission down to 10.5 today.  Unfortunately, her renal function has continued to decline going from 3.3 baseline to 3.9 on admission and has continued to climb and is at 4.95 as of today.  Patient herself states that she feels a little bit better, although very tired.  Nephrology consulted and it is suspected that likely with her sepsis, her already very weak kidneys have continued to receive damage and she will likely need dialysis started during this hospitalization.  Assessment/Plan:  Sepsis secondary to UTI (urinary tract infection)/gram-negative/E. coli bacteremia: Patient met criteria for sepsis on admission given leukocytosis, tachycardia and tachypnea with urinary source.   Urine and blood cultures grew out E. coli, near pansensitive.  Sepsis itself stabilized.  Was on Rocephin.  Antibiotics switched to Keflex on 01/09/2020.  Tobacco abuse: Counseled to quit, declined nicotine patch  Acute kidney injury in the setting of CKD (chronic kidney disease), stage IV/V ([place]): Followed by nephrology.  Given some IV fluids, her renal function proved but she developed dyspnea.  Fluids were stopped and she was started on Lasix.  Renal function proved somewhat today.  Feels better but still with some dyspnea.  Appreciate nephrology help and defer further management to them.   Morbid obesity [facility]): Patient criteria BMI greater than 40  Essential HTN  (hypertension): Very well controlled.  Continue home dose of carvedilol.  DM (diabetes mellitus), type 2 with renal complications ([place]): On 6 units NovoLog premeal 3 times daily as well as NPH 20 units twice daily and SSI.  Slight hypoglycemia this morning.  Discontinue Premeal regimen.  Continue rest of the regimen.  Acute on chronic systolic CHF (congestive heart failure) ([place]) causing acute respiratory failure with hypoxia: Last echo in June 2020 shows ejection fraction of 40 to 45%.  She is on diuretics now.  Has minimal basal crackles.  Oxygen requirement improving.  Currently on 2 L nasal correction.  Encourage incentive d.  Anemia of chronic disease: Baseline hemoglobin between 10 and 11.  Currently 7.7.  No indication of transfusion.  Monitor.  Code Status: Full code  Family Communication: Prior hospitalist discussed with son at the bedside on 4/2  Patient from: Home  Disposition: To be determined based on clinical course.    Barriers to discharge: monitor renal function and patient will may need dialysis during this hospitalization.    Consultants:  Nephrology  Procedures:  None  Antimicrobials:  IV Rocephin 3/31-4/2  Keflex 4/2-present  DVT prophylaxis: SCDs  Subjective: Patient seen and examined.  Complains of minimal shortness of breath but no other complaint.  Overall better than yesterday.  Objective: Vitals:   01/09/20 2139 01/10/20 0524  BP: 124/60 130/61  Pulse: 70 71  Resp: 15 14  Temp: 98.3 F (36.8 C) 97.9 F (36.6 C)  SpO2: 99% 100%    Intake/Output Summary (Last 24 hours) at 01/10/2020 0814 Last data filed at 01/10/2020 0524 Gross per 24 hour  Intake 1293.03 ml  Output 625 ml  Net 668.03 ml   Filed Weights   01/05/20 1828 01/06/20 0158  Weight: 113.4 kg 113.4 kg   Body mass index is 44.29 kg/m.  Exam:  General exam: Appears calm and comfortable  Respiratory system: Crackles at bases bilaterally. Respiratory effort  normal. Cardiovascular system: S1 & S2 heard, RRR. No JVD, murmurs, rubs, gallops or clicks.  +1-2.  Edema bilateral lower extremity. Gastrointestinal system: Abdomen is nondistended, soft and nontender. No organomegaly or masses felt. Normal bowel sounds heard. Central nervous system: Alert and oriented. No focal neurological deficits. Extremities: Symmetric 5 x 5 power. Skin: No rashes, lesions or ulcers.  Psychiatry: Judgement and insight appear poor.  Mood & affect appropriate.    Data Reviewed: CBC: Recent Labs  Lab 01/06/20 0243 01/06/20 0243 01/07/20 0327 01/08/20 0347 01/08/20 1355 01/09/20 0451 01/10/20 0609  WBC 24.3*  --  16.2* 10.5  --  8.2 7.0  HGB 9.6*   < > 8.2* 7.6* 8.5* 7.6* 7.7*  HCT 31.1*   < > 25.7* 24.4* 27.5* 24.1* 25.5*  MCV 90.4  --  86.5 89.1  --  88.9 89.8  PLT 289  --  245 246  --  261 290   < > = values in this interval not displayed.   Basic Metabolic Panel: Recent Labs  Lab 01/05/20 1901 01/06/20 0243 01/07/20 0327 01/08/20 0347 01/09/20 0451  NA 132* 130* 131* 133* 135  K 3.9 3.8 4.1 4.7 4.5  CL 87* 86* 86* 87* 89*  CO2 '28 29 30 31 ' 32  GLUCOSE 311* 277* 240* 196* 144*  BUN 105* 111* 118* 130* 131*  CREATININE 3.90* 3.93* 4.44* 4.95* 4.49*  CALCIUM 8.8* 9.0 8.7* 8.5* 8.3*  MG  --   --  1.6* 1.8  --   PHOS  --   --   --  5.9*  --    GFR: Estimated Creatinine Clearance: 14.3 mL/min (A) (by C-G formula based on SCr of 4.49 mg/dL (H)). Liver Function Tests: Recent Labs  Lab 01/05/20 1901 01/08/20 0347 01/09/20 0451  AST '30 28 20  ' ALT '17 19 17  ' ALKPHOS 57 54 51  BILITOT 0.3 0.6 0.7  PROT 6.2* 5.8* 5.8*  ALBUMIN 2.9* 2.3* 2.2*   Recent Labs  Lab 01/05/20 1901  LIPASE 25   No results for input(s): AMMONIA in the last 168 hours. Coagulation Profile: Recent Labs  Lab 01/06/20 0153  INR 1.2   Cardiac Enzymes: Recent Labs  Lab 01/08/20 0347  CKTOTAL 81   BNP (last 3 results) No results for input(s): PROBNP in the last  8760 hours. HbA1C: No results for input(s): HGBA1C in the last 72 hours. CBG: Recent Labs  Lab 01/09/20 0757 01/09/20 1224 01/09/20 1657 01/09/20 2127 01/10/20 0753  GLUCAP 125* 152* 124* 156* 73   Lipid Profile: No results for input(s): CHOL, HDL, LDLCALC, TRIG, CHOLHDL, LDLDIRECT in the last 72 hours. Thyroid Function Tests: No results for input(s): TSH, T4TOTAL, FREET4, T3FREE, THYROIDAB in the last 72 hours. Anemia Panel: Recent Labs    01/08/20 1355  TIBC 351  IRON 13*   Urine analysis:    Component Value Date/Time   COLORURINE AMBER (A) 01/05/2020 2110   APPEARANCEUR CLOUDY (A) 01/05/2020 2110   LABSPEC 1.013 01/05/2020 2110   PHURINE 5.0 01/05/2020 2110   GLUCOSEU NEGATIVE 01/05/2020 2110   HGBUR LARGE (A) 01/05/2020 2110   BILIRUBINUR NEGATIVE 01/05/2020 2110   [place] NEGATIVE 01/05/2020 2110   PROTEINUR 100 (A) 01/05/2020 2110  UROBILINOGEN 0.2 08/04/2014 1409   NITRITE NEGATIVE 01/05/2020 2110   LEUKOCYTESUR MODERATE (A) 01/05/2020 2110   Sepsis Labs: '@LABRCNTIP' (procalcitonin:4,lacticidven:4)  ) Recent Results (from the past 240 hour(s))  Blood Culture (routine x 2)     Status: Abnormal   Collection Time: 01/06/20  1:53 AM   Specimen: BLOOD  Result Value Ref Range Status   Specimen Description BLOOD RIGHT ARM  Final   Special Requests   Final    BOTTLES DRAWN AEROBIC AND ANAEROBIC Blood Culture adequate volume   Culture  Setup Time   Final    GRAM NEGATIVE RODS IN BOTH AEROBIC AND ANAEROBIC BOTTLES CRITICAL RESULT CALLED TO, READ BACK BY AND VERIFIED WITH: Starlyn Skeans PharmD 16:20 01/06/20 ([email or web address]) Performed at [facility], [place] [address], [place], [place] [address]    Culture ESCHERICHIA COLI (A)  Final   Report Status 01/08/2020 FINAL  Final   Organism ID, Bacteria ESCHERICHIA COLI  Final      Susceptibility   Escherichia coli - MIC*    AMPICILLIN <=2 SENSITIVE Sensitive     CEFAZOLIN <=4 SENSITIVE Sensitive     CEFEPIME <=0.12  SENSITIVE Sensitive     CEFTAZIDIME <=1 SENSITIVE Sensitive     CEFTRIAXONE <=0.25 SENSITIVE Sensitive     CIPROFLOXACIN <=0.25 SENSITIVE Sensitive     GENTAMICIN <=1 SENSITIVE Sensitive     IMIPENEM <=0.25 SENSITIVE Sensitive     TRIMETH/SULFA <=20 SENSITIVE Sensitive     AMPICILLIN/SULBACTAM <=2 SENSITIVE Sensitive     PIP/TAZO <=4 SENSITIVE Sensitive     * ESCHERICHIA COLI  Blood Culture ID Panel (Reflexed)     Status: Abnormal   Collection Time: 01/06/20  1:53 AM  Result Value Ref Range Status   Enterococcus species NOT DETECTED NOT DETECTED Final   Listeria monocytogenes NOT DETECTED NOT DETECTED Final   Staphylococcus species NOT DETECTED NOT DETECTED Final   Staphylococcus aureus (BCID) NOT DETECTED NOT DETECTED Final   Streptococcus species NOT DETECTED NOT DETECTED Final   Streptococcus agalactiae NOT DETECTED NOT DETECTED Final   Streptococcus pneumoniae NOT DETECTED NOT DETECTED Final   Streptococcus pyogenes NOT DETECTED NOT DETECTED Final   Acinetobacter baumannii NOT DETECTED NOT DETECTED Final   Enterobacteriaceae species DETECTED (A) NOT DETECTED Final    Comment: Enterobacteriaceae represent a large family of gram-negative bacteria, not a single organism. CRITICAL RESULT CALLED TO, READ BACK BY AND VERIFIED WITH: Starlyn Skeans PharmD 16:20 01/06/20 ([email or web address])    Enterobacter cloacae complex NOT DETECTED NOT DETECTED Final   Escherichia coli DETECTED (A) NOT DETECTED Final    Comment: CRITICAL RESULT CALLED TO, READ BACK BY AND VERIFIED WITH: Starlyn Skeans PharmD 16:20 01/06/20 ([email or web address])    Klebsiella oxytoca NOT DETECTED NOT DETECTED Final   Klebsiella pneumoniae NOT DETECTED NOT DETECTED Final   Proteus species NOT DETECTED NOT DETECTED Final   Serratia marcescens NOT DETECTED NOT DETECTED Final   Carbapenem resistance NOT DETECTED NOT DETECTED Final   Haemophilus influenzae NOT DETECTED NOT DETECTED Final   Neisseria meningitidis NOT DETECTED NOT DETECTED Final    Pseudomonas aeruginosa NOT DETECTED NOT DETECTED Final   Candida albicans NOT DETECTED NOT DETECTED Final   Candida glabrata NOT DETECTED NOT DETECTED Final   Candida krusei NOT DETECTED NOT DETECTED Final   Candida parapsilosis NOT DETECTED NOT DETECTED Final   Candida tropicalis NOT DETECTED NOT DETECTED Final    Comment: Performed at [facility], [place] [address], [place], [place] [address]  SARS CORONAVIRUS  2 (TAT 6-24 HRS) Nasopharyngeal Nasopharyngeal Swab     Status: None   Collection Time: 01/06/20  1:57 AM   Specimen: Nasopharyngeal Swab  Result Value Ref Range Status   SARS Coronavirus 2 NEGATIVE NEGATIVE Final    Comment: (NOTE) SARS-CoV-2 target nucleic acids are NOT DETECTED. The SARS-CoV-2 RNA is generally detectable in upper and lower respiratory specimens during the acute phase of infection. Negative results do not preclude SARS-CoV-2 infection, do not rule out co-infections with other pathogens, and should not be used as the sole basis for treatment or other patient management decisions. Negative results must be combined with clinical observations, patient history, and epidemiological information. The expected result is Negative. Fact Sheet for Patients: SugarRoll.be Fact Sheet for Healthcare Providers: https://www.woods-mathews.com/ This test is not yet approved or cleared by the [place] FDA and  has been authorized for detection and/or diagnosis of SARS-CoV-2 by FDA under an Emergency Use Authorization (EUA). This EUA will remain  in effect (meaning this test can be used) for the duration of the COVID-19 declaration under Section 56 4(b)(1) of the Act, 21 U.S.C. section 360bbb-3(b)(1), unless the authorization is terminated or revoked sooner. Performed at [facility], [place] [address] [place], [place] [address]   Blood Culture (routine x 2)     Status: Abnormal   Collection Time: 01/06/20  2:42 AM    Specimen: BLOOD  Result Value Ref Range Status   Specimen Description BLOOD LEFT ARM  Final   Special Requests   Final    BOTTLES DRAWN AEROBIC AND ANAEROBIC Blood Culture adequate volume   Culture  Setup Time   Final    GRAM NEGATIVE RODS IN BOTH AEROBIC AND ANAEROBIC BOTTLES CRITICAL VALUE NOTED.  VALUE IS CONSISTENT WITH PREVIOUSLY REPORTED AND CALLED VALUE.    Culture (A)  Final    ESCHERICHIA COLI SUSCEPTIBILITIES PERFORMED ON PREVIOUS CULTURE WITHIN THE LAST 5 DAYS. Performed at [facility], [place] [address], [place], [place] [address]    Report Status 01/08/2020 FINAL  Final  Urine culture     Status: Abnormal   Collection Time: 01/06/20  4:04 PM   Specimen: In/Out Cath Urine  Result Value Ref Range Status   Specimen Description IN/OUT CATH URINE  Final   Special Requests   Final    NONE Performed at [facility], [place] [address] [place], [place] [address]    Culture 5,000 COLONIES/mL ESCHERICHIA COLI (A)  Final   Report Status 01/08/2020 FINAL  Final   Organism ID, Bacteria ESCHERICHIA COLI (A)  Final      Susceptibility   Escherichia coli - MIC*    AMPICILLIN <=2 SENSITIVE Sensitive     CEFAZOLIN <=4 SENSITIVE Sensitive     CEFTRIAXONE <=0.25 SENSITIVE Sensitive     CIPROFLOXACIN <=0.25 SENSITIVE Sensitive     GENTAMICIN <=1 SENSITIVE Sensitive     IMIPENEM <=0.25 SENSITIVE Sensitive     NITROFURANTOIN <=16 SENSITIVE Sensitive     TRIMETH/SULFA <=20 SENSITIVE Sensitive     AMPICILLIN/SULBACTAM <=2 SENSITIVE Sensitive     PIP/TAZO <=4 SENSITIVE Sensitive     * 5,000 COLONIES/mL ESCHERICHIA COLI      Studies: VAS [name] UPPER EXT VEIN MAPPING (PRE-OP AVF)  Result Date: 01/09/2020 UPPER EXTREMITY VEIN MAPPING  Indications: Pre-Dialysis access. Comparison Study: No prior study Performing Technologist: Sharion Dove RVS  Examination Guidelines: A complete evaluation includes B-mode imaging, spectral Doppler, color Doppler, and power Doppler as needed of all  accessible portions of each vessel. Bilateral testing is considered an integral part of a complete examination. Limited examinations for reoccurring indications may be performed as noted. +-----------------+-------------+----------+--------+ Right Cephalic   Diameter (cm)Depth (cm)Findings +-----------------+-------------+----------+--------+ Prox upper arm       0.32        0.27            +-----------------+-------------+----------+--------+ Mid upper arm        0.26        0.28   tortuous +-----------------+-------------+----------+--------+ Dist upper arm       0.26        0.34   tortuous +-----------------+-------------+----------+--------+ Antecubital fossa    0.20        0.22   tortuous +-----------------+-------------+----------+--------+ Prox forearm         0.26        0.14   thrombus +-----------------+-------------+----------+--------+ Mid forearm          0.33        0.85            +-----------------+-------------+----------+--------+ Wrist                0.25        0.47            +-----------------+-------------+----------+--------+ +-----------------+-------------+----------+---------+ Right Basilic    Diameter (cm)Depth (cm)Findings  +-----------------+-------------+----------+---------+ Mid upper arm        0.72        2.80    origin   +-----------------+-------------+----------+---------+ Dist upper arm       0.53        2.45             +-----------------+-------------+----------+---------+ Antecubital fossa    0.37        1.18             +-----------------+-------------+----------+---------+ Prox forearm         0.28        0.72   branching +-----------------+-------------+----------+---------+ Mid forearm          0.24        0.53   branching +-----------------+-------------+----------+---------+ Wrist                0.25        0.23             +-----------------+-------------+----------+---------+  +-----------------+-------------+----------+--------+ Left Cephalic    Diameter (cm)Depth (cm)Findings +-----------------+-------------+----------+--------+ Prox upper arm       0.17        0.16            +-----------------+-------------+----------+--------+ Mid upper arm        0.23        0.24   tortuous +-----------------+-------------+----------+--------+ Dist upper arm       0.21        0.23   tortuous +-----------------+-------------+----------+--------+ Antecubital fossa    0.20        0.13            +-----------------+-------------+----------+--------+ Prox forearm         0.26        0.88            +-----------------+-------------+----------+--------+ Mid forearm          0.19        0.18            +-----------------+-------------+----------+--------+ Dist forearm         0.24        0.18            +-----------------+-------------+----------+--------+  Wrist                0.16        0.25            +-----------------+-------------+----------+--------+ +-----------------+-------------+----------+---------+ Left Basilic     Diameter (cm)Depth (cm)Findings  +-----------------+-------------+----------+---------+ Prox upper arm       0.53        2.85    origin   +-----------------+-------------+----------+---------+ Mid upper arm        0.46        2.50             +-----------------+-------------+----------+---------+ Dist upper arm       0.53        2.63   branching +-----------------+-------------+----------+---------+ Antecubital fossa    0.32        0.69             +-----------------+-------------+----------+---------+ Prox forearm         0.36        0.34   branching +-----------------+-------------+----------+---------+ Mid forearm          0.31        0.82   branching +-----------------+-------------+----------+---------+ Wrist                0.26        0.47              +-----------------+-------------+----------+---------+ *See table(s) above for measurements and observations.  Diagnosing physician:    Preliminary     Scheduled Meds: . allopurinol  100 mg Oral Daily  . bisacodyl  10 mg Rectal Daily  . carvedilol  12.5 mg Oral BID  . cephALEXin  500 mg Oral Q8H  . cholecalciferol  2,000 Units Oral Daily  . cinacalcet  30 mg Oral Q breakfast  . clopidogrel  75 mg Oral Daily  . fenofibrate  160 mg Oral Daily  . furosemide  80 mg Intravenous Q12H  . heparin  5,000 Units Subcutaneous Q8H  . hydrOXYzine  25 mg Oral BID  . insulin aspart  0-15 Units Subcutaneous TID WC  . insulin aspart  6 Units Subcutaneous TID WC  . insulin NPH Human  20 Units Subcutaneous BID AC & HS  . levothyroxine  150 mcg Oral Q0600  . magnesium oxide  400 mg Oral BID  . mirabegron ER  25 mg Oral Daily  . mometasone-formoterol  2 puff Inhalation BID  . multivitamin with minerals  1 tablet Oral Daily  . pantoprazole  40 mg Oral Daily  . polyethylene glycol  17 g Oral Daily  . potassium chloride  20 mEq Oral Q M,W,F  . simvastatin  20 mg Oral QHS  . tiZANidine  4 mg Oral BID    Continuous Infusions: . ferric gluconate (FERRLECIT/NULECIT) IV Stopped (01/09/20 1515)     LOS: 3 days    Total time spent 33 minutes Darliss Cheney, MD [facility]   01/10/2020, 8:14 AM

## 2020-01-10 NOTE — Plan of Care (Signed)
  Problem: Clinical Measurements: Goal: Ability to maintain clinical measurements within normal limits will improve Outcome: Progressing   Problem: Activity: Goal: Risk for activity intolerance will decrease Outcome: Progressing   Problem: Nutrition: Goal: Adequate nutrition will be maintained Outcome: Not Progressing

## 2020-01-10 NOTE — Progress Notes (Signed)
[facility] ROUNDING NOTE   Subjective:   [name] [name] is a 69 y.o. female.  With a history of chronic kidney disease stage IV baseline serum creatinine appears to be between 3 to 4 mg/dL.  That looks to have been some progression of her renal disease in the records over the past 6 to 12 months.  She does have a history of diabetes mellitus type 2 she also has a history of chronic congestive heart failure.  2D echo 03/18/2019 showed ejection fraction of 45 to 55%.  She presented with urosepsis with E. coli bacteremia  Somewhat somnolent.  She is agreeable to initiation of dialysis if needed. Blood pressure 130/61 pulse 71 temperature 97.9  Labs pending 01/10/2020  Allopurinol 100 mg daily carvedilol 12.5 mg twice daily, allopurinol 100 mg daily vitamin D 2000 units daily Sensipar 30 mg daily Plavix 75 mg daily fenofibrate 160 mg daily insulin sliding scale, levothyroxine 150 mcg daily magnesium oxide 400 mg daily Myrbetriq 25 mg, renal vitamins 1 daily Protonix 40 mg daily, simvastatin 20 mg daily potassium 20 mEq daily   Keflex 500 mg every 8 hours  Urine output 1 L.  Positive balance since admission 2 L  IV Lasix 80 mg every 12 hours    Objective:  Vital signs in last 24 hours:  Temp:  [97.7 F (36.5 C)-98.3 F (36.8 C)] 97.9 F (36.6 C) (04/04 0524) Pulse Rate:  [70-72] 71 (04/04 0524) Resp:  [14-20] 14 (04/04 0524) BP: (100-130)/(58-63) 130/61 (04/04 0524) SpO2:  [88 %-100 %] 100 % (04/04 0524)  Weight change:  Filed Weights   01/05/20 1828 01/06/20 0158  Weight: 113.4 kg 113.4 kg    Intake/Output: I/O last 3 completed shifts: In: 2649.7 [P.O.:900; I.V.:1599.4; IV Piggyback:150.3] Out: 1025 [Urine:1025]   Intake/Output this shift:  No intake/output data recorded.  General: Awake oriented not in any distress 1 L nasal cannula HEENT: Oral mucosa was moist Eyes: Pupils round equal reactive no icterus no pallor Neck: Supple JVP mildly increased   Heart: Regular rate and rhythm no murmurs rubs or gallops Lungs: Clear to auscultation bibasilar rales  Abdomen: Soft nontender bowel sounds present Extremities: peripheral edema lateral Skin: Warm without rashes Neuro: Grossly intact   Basic Metabolic Panel: Recent Labs  Lab 01/05/20 1901 01/05/20 1901 01/06/20 0243 01/06/20 0243 01/07/20 0327 01/08/20 0347 01/09/20 0451  NA 132*  --  130*  --  131* 133* 135  K 3.9  --  3.8  --  4.1 4.7 4.5  CL 87*  --  86*  --  86* 87* 89*  CO2 28  --  29  --  30 31 32  GLUCOSE 311*  --  277*  --  240* 196* 144*  BUN 105*  --  111*  --  118* 130* 131*  CREATININE 3.90*  --  3.93*  --  4.44* 4.95* 4.49*  CALCIUM 8.8*   < > 9.0   < > 8.7* 8.5* 8.3*  MG  --   --   --   --  1.6* 1.8  --   PHOS  --   --   --   --   --  5.9*  --    < > = values in this interval not displayed.    Liver Function Tests: Recent Labs  Lab 01/05/20 1901 01/08/20 0347 01/09/20 0451  AST 30 28 20   ALT 17 19 17   ALKPHOS 57 54 51  BILITOT 0.3 0.6 0.7  PROT 6.2* 5.8* 5.8*  ALBUMIN 2.9* 2.3* 2.2*   Recent Labs  Lab 01/05/20 1901  LIPASE 25   No results for input(s): AMMONIA in the last 168 hours.  CBC: Recent Labs  Lab 01/06/20 0243 01/06/20 0243 01/07/20 0327 01/08/20 0347 01/08/20 1355 01/09/20 0451 01/10/20 0609  WBC 24.3*  --  16.2* 10.5  --  8.2 7.0  HGB 9.6*   < > 8.2* 7.6* 8.5* 7.6* 7.7*  HCT 31.1*   < > 25.7* 24.4* 27.5* 24.1* 25.5*  MCV 90.4  --  86.5 89.1  --  88.9 89.8  PLT 289  --  245 246  --  261 290   < > = values in this interval not displayed.    Cardiac Enzymes: Recent Labs  Lab 01/08/20 0347  CKTOTAL 81    BNP: Invalid input(s): POCBNP  CBG: Recent Labs  Lab 01/09/20 0757 01/09/20 1224 01/09/20 1657 01/09/20 2127 01/10/20 0753  GLUCAP 125* 152* 124* 156* 74    Microbiology: Results for orders placed or performed during the hospital encounter of 01/05/20  Blood Culture (routine x 2)     Status: Abnormal    Collection Time: 01/06/20  1:53 AM   Specimen: BLOOD  Result Value Ref Range Status   Specimen Description BLOOD RIGHT ARM  Final   Special Requests   Final    BOTTLES DRAWN AEROBIC AND ANAEROBIC Blood Culture adequate volume   Culture  Setup Time   Final    GRAM NEGATIVE RODS IN BOTH AEROBIC AND ANAEROBIC BOTTLES CRITICAL RESULT CALLED TO, READ BACK BY AND VERIFIED WITH: Starlyn Skeans PharmD 16:20 01/06/20 ([email or web address]) Performed at [facility], [place] [address] [place], [place] [address]    Culture ESCHERICHIA COLI (A)  Final   Report Status 01/08/2020 FINAL  Final   Organism ID, Bacteria ESCHERICHIA COLI  Final      Susceptibility   Escherichia coli - MIC*    AMPICILLIN <=2 SENSITIVE Sensitive     CEFAZOLIN <=4 SENSITIVE Sensitive     CEFEPIME <=0.12 SENSITIVE Sensitive     CEFTAZIDIME <=1 SENSITIVE Sensitive     CEFTRIAXONE <=0.25 SENSITIVE Sensitive     CIPROFLOXACIN <=0.25 SENSITIVE Sensitive     GENTAMICIN <=1 SENSITIVE Sensitive     IMIPENEM <=0.25 SENSITIVE Sensitive     TRIMETH/SULFA <=20 SENSITIVE Sensitive     AMPICILLIN/SULBACTAM <=2 SENSITIVE Sensitive     PIP/TAZO <=4 SENSITIVE Sensitive     * ESCHERICHIA COLI  Blood Culture ID Panel (Reflexed)     Status: Abnormal   Collection Time: 01/06/20  1:53 AM  Result Value Ref Range Status   Enterococcus species NOT DETECTED NOT DETECTED Final   Listeria monocytogenes NOT DETECTED NOT DETECTED Final   Staphylococcus species NOT DETECTED NOT DETECTED Final   Staphylococcus aureus (BCID) NOT DETECTED NOT DETECTED Final   Streptococcus species NOT DETECTED NOT DETECTED Final   Streptococcus agalactiae NOT DETECTED NOT DETECTED Final   Streptococcus pneumoniae NOT DETECTED NOT DETECTED Final   Streptococcus pyogenes NOT DETECTED NOT DETECTED Final   Acinetobacter baumannii NOT DETECTED NOT DETECTED Final   Enterobacteriaceae species DETECTED (A) NOT DETECTED Final    Comment: Enterobacteriaceae represent a large family of  gram-negative bacteria, not a single organism. CRITICAL RESULT CALLED TO, READ BACK BY AND VERIFIED WITH: Starlyn Skeans PharmD 16:20 01/06/20 ([email or web address])    Enterobacter cloacae complex NOT DETECTED NOT DETECTED Final   Escherichia coli DETECTED (A) NOT DETECTED Final  Comment: CRITICAL RESULT CALLED TO, READ BACK BY AND VERIFIED WITH: Starlyn Skeans PharmD 16:20 01/06/20 ([email or web address])    Klebsiella oxytoca NOT DETECTED NOT DETECTED Final   Klebsiella pneumoniae NOT DETECTED NOT DETECTED Final   Proteus species NOT DETECTED NOT DETECTED Final   Serratia marcescens NOT DETECTED NOT DETECTED Final   Carbapenem resistance NOT DETECTED NOT DETECTED Final   Haemophilus influenzae NOT DETECTED NOT DETECTED Final   Neisseria meningitidis NOT DETECTED NOT DETECTED Final   Pseudomonas aeruginosa NOT DETECTED NOT DETECTED Final   Candida albicans NOT DETECTED NOT DETECTED Final   Candida glabrata NOT DETECTED NOT DETECTED Final   Candida krusei NOT DETECTED NOT DETECTED Final   Candida parapsilosis NOT DETECTED NOT DETECTED Final   Candida tropicalis NOT DETECTED NOT DETECTED Final    Comment: Performed at [facility], [place] [address] [place], [place] [address]  SARS CORONAVIRUS 2 (TAT 6-24 HRS) Nasopharyngeal Nasopharyngeal Swab     Status: None   Collection Time: 01/06/20  1:57 AM   Specimen: Nasopharyngeal Swab  Result Value Ref Range Status   SARS Coronavirus 2 NEGATIVE NEGATIVE Final    Comment: (NOTE) SARS-CoV-2 target nucleic acids are NOT DETECTED. The SARS-CoV-2 RNA is generally detectable in upper and lower respiratory specimens during the acute phase of infection. Negative results do not preclude SARS-CoV-2 infection, do not rule out co-infections with other pathogens, and should not be used as the sole basis for treatment or other patient management decisions. Negative results must be combined with clinical observations, patient history, and epidemiological information. The expected result  is Negative. Fact Sheet for Patients: SugarRoll.be Fact Sheet for Healthcare Providers: https://www.woods-mathews.com/ This test is not yet approved or cleared by the [place] FDA and  has been authorized for detection and/or diagnosis of SARS-CoV-2 by FDA under an Emergency Use Authorization (EUA). This EUA will remain  in effect (meaning this test can be used) for the duration of the COVID-19 declaration under Section 56 4(b)(1) of the Act, 21 U.S.C. section 360bbb-3(b)(1), unless the authorization is terminated or revoked sooner. Performed at [facility], [place] [address] [place], [place] [address]   Blood Culture (routine x 2)     Status: Abnormal   Collection Time: 01/06/20  2:42 AM   Specimen: BLOOD  Result Value Ref Range Status   Specimen Description BLOOD LEFT ARM  Final   Special Requests   Final    BOTTLES DRAWN AEROBIC AND ANAEROBIC Blood Culture adequate volume   Culture  Setup Time   Final    GRAM NEGATIVE RODS IN BOTH AEROBIC AND ANAEROBIC BOTTLES CRITICAL VALUE NOTED.  VALUE IS CONSISTENT WITH PREVIOUSLY REPORTED AND CALLED VALUE.    Culture (A)  Final    ESCHERICHIA COLI SUSCEPTIBILITIES PERFORMED ON PREVIOUS CULTURE WITHIN THE LAST 5 DAYS. Performed at [facility], [place] [address], [place], [place] [address]    Report Status 01/08/2020 FINAL  Final  Urine culture     Status: Abnormal   Collection Time: 01/06/20  4:04 PM   Specimen: In/Out Cath Urine  Result Value Ref Range Status   Specimen Description IN/OUT CATH URINE  Final   Special Requests   Final    NONE Performed at [facility], [place] [address] [place], [place] [address]    Culture 5,000 COLONIES/mL ESCHERICHIA COLI (A)  Final   Report Status 01/08/2020 FINAL  Final   Organism ID, Bacteria ESCHERICHIA COLI (A)  Final  Susceptibility   Escherichia coli - MIC*    AMPICILLIN <=2 SENSITIVE Sensitive     CEFAZOLIN <=4 SENSITIVE  Sensitive     CEFTRIAXONE <=0.25 SENSITIVE Sensitive     CIPROFLOXACIN <=0.25 SENSITIVE Sensitive     GENTAMICIN <=1 SENSITIVE Sensitive     IMIPENEM <=0.25 SENSITIVE Sensitive     NITROFURANTOIN <=16 SENSITIVE Sensitive     TRIMETH/SULFA <=20 SENSITIVE Sensitive     AMPICILLIN/SULBACTAM <=2 SENSITIVE Sensitive     PIP/TAZO <=4 SENSITIVE Sensitive     * 5,000 COLONIES/mL ESCHERICHIA COLI    Coagulation Studies: No results for input(s): LABPROT, INR in the last 72 hours.  Urinalysis: No results for input(s): COLORURINE, LABSPEC, PHURINE, GLUCOSEU, HGBUR, BILIRUBINUR, KETONESUR, PROTEINUR, UROBILINOGEN, NITRITE, LEUKOCYTESUR in the last 72 hours.  Invalid input(s): APPERANCEUR    Imaging: VAS [name] UPPER EXT VEIN MAPPING (PRE-OP AVF)  Result Date: 01/10/2020 UPPER EXTREMITY VEIN MAPPING  Indications: Pre-Dialysis access. Comparison Study: No prior study Performing Technologist: Sharion Dove RVS  Examination Guidelines: A complete evaluation includes B-mode imaging, spectral Doppler, color Doppler, and power Doppler as needed of all accessible portions of each vessel. Bilateral testing is considered an integral part of a complete examination. Limited examinations for reoccurring indications may be performed as noted. +-----------------+-------------+----------+--------+ Right Cephalic   Diameter (cm)Depth (cm)Findings +-----------------+-------------+----------+--------+ Prox upper arm       0.32        0.27            +-----------------+-------------+----------+--------+ Mid upper arm        0.26        0.28   tortuous +-----------------+-------------+----------+--------+ Dist upper arm       0.26        0.34   tortuous +-----------------+-------------+----------+--------+ Antecubital fossa    0.20        0.22   tortuous +-----------------+-------------+----------+--------+ Prox forearm         0.26        0.14   thrombus  +-----------------+-------------+----------+--------+ Mid forearm          0.33        0.85            +-----------------+-------------+----------+--------+ Wrist                0.25        0.47            +-----------------+-------------+----------+--------+ +-----------------+-------------+----------+---------+ Right Basilic    Diameter (cm)Depth (cm)Findings  +-----------------+-------------+----------+---------+ Mid upper arm        0.72        2.80    origin   +-----------------+-------------+----------+---------+ Dist upper arm       0.53        2.45             +-----------------+-------------+----------+---------+ Antecubital fossa    0.37        1.18             +-----------------+-------------+----------+---------+ Prox forearm         0.28        0.72   branching +-----------------+-------------+----------+---------+ Mid forearm          0.24        0.53   branching +-----------------+-------------+----------+---------+ Wrist                0.25        0.23             +-----------------+-------------+----------+---------+ +-----------------+-------------+----------+--------+ Left Cephalic    Diameter (  cm)Depth (cm)Findings +-----------------+-------------+----------+--------+ Prox upper arm       0.17        0.16            +-----------------+-------------+----------+--------+ Mid upper arm        0.23        0.24   tortuous +-----------------+-------------+----------+--------+ Dist upper arm       0.21        0.23   tortuous +-----------------+-------------+----------+--------+ Antecubital fossa    0.20        0.13            +-----------------+-------------+----------+--------+ Prox forearm         0.26        0.88            +-----------------+-------------+----------+--------+ Mid forearm          0.19        0.18            +-----------------+-------------+----------+--------+ Dist forearm         0.24        0.18             +-----------------+-------------+----------+--------+ Wrist                0.16        0.25            +-----------------+-------------+----------+--------+ +-----------------+-------------+----------+---------+ Left Basilic     Diameter (cm)Depth (cm)Findings  +-----------------+-------------+----------+---------+ Prox upper arm       0.53        2.85    origin   +-----------------+-------------+----------+---------+ Mid upper arm        0.46        2.50             +-----------------+-------------+----------+---------+ Dist upper arm       0.53        2.63   branching +-----------------+-------------+----------+---------+ Antecubital fossa    0.32        0.69             +-----------------+-------------+----------+---------+ Prox forearm         0.36        0.34   branching +-----------------+-------------+----------+---------+ Mid forearm          0.31        0.82   branching +-----------------+-------------+----------+---------+ Wrist                0.26        0.47             +-----------------+-------------+----------+---------+ *See table(s) above for measurements and observations.  Diagnosing physician: Curt Jews MD Electronically signed by Curt Jews MD on 01/10/2020 at 8:17:25 AM.    Final      Medications:   . ferric gluconate (FERRLECIT/NULECIT) IV Stopped (01/09/20 1515)   . allopurinol  100 mg Oral Daily  . bisacodyl  10 mg Rectal Daily  . carvedilol  12.5 mg Oral BID  . cephALEXin  500 mg Oral Q8H  . cholecalciferol  2,000 Units Oral Daily  . cinacalcet  30 mg Oral Q breakfast  . clopidogrel  75 mg Oral Daily  . fenofibrate  160 mg Oral Daily  . furosemide  80 mg Intravenous Q12H  . heparin  5,000 Units Subcutaneous Q8H  . hydrOXYzine  25 mg Oral BID  . insulin aspart  0-15 Units Subcutaneous TID WC  . insulin aspart  6 Units Subcutaneous TID WC  . insulin NPH Human  20  Units Subcutaneous BID AC & HS  . levothyroxine  150 mcg  Oral Q0600  . magnesium oxide  400 mg Oral BID  . mirabegron ER  25 mg Oral Daily  . mometasone-formoterol  2 puff Inhalation BID  . multivitamin with minerals  1 tablet Oral Daily  . pantoprazole  40 mg Oral Daily  . polyethylene glycol  17 g Oral Daily  . potassium chloride  20 mEq Oral Q M,W,F  . simvastatin  20 mg Oral QHS  . tiZANidine  4 mg Oral BID   acetaminophen **OR** acetaminophen, albuterol, fluticasone, ondansetron **OR** ondansetron (ZOFRAN) IV, oxyCODONE-acetaminophen **AND** oxyCODONE, polyvinyl alcohol  Assessment/ Plan:  1.Acute on chronic kidney disease with worsening azotemia this is being in the setting of aggressive diuresis with a combination of torsemide and metolazone.  There is no obstruction on CT scan.  Patient appears to be making urine although is clearly volume overloaded.  Urinalysis is positive for pyuria with a positive urine culture for E. coli.  This appears to be progression of her renal disease.  I believe that she will need to start dialysis in the near future.  She has azotemia without the use of steroids or GI bleed.  This in the setting of her congestive heart failure with systolic dysfunction is troubling for cardiorenal syndrome.  Will obtain vein mapping and involve vein and vascular surgery to see if we can place vascular access.  It is likely that she will need dialysis this admission.  Would avoid nephrotoxins ACE inhibitor's ARB use nonsteroidal anti-inflammatory drugs.  We will try to avoid the use of IV contrast  2. Hypertension/volume  -clearly has volume overload received IV Lasix 01/07/2020.  Torsemide has been discontinued 01/06/2020. Would stop IVF and diurese today we will continue IV diuresis. 3.  Anemia low iron saturation start IV iron start darbepoetin 100 mcg weekly. 4.  Bones will check PTH and follow renal panel for evaluation of phosphorus 5.  Hyponatremia.  Probably as result of renal disease but also could be in the setting of  Zaroxolyn.. 6.  Diabetes mellitus as per primary team. 7.  E. coli UTI currently on therapy with IV antibiotics 8.  I believe that we will need to initiate dialysis next week.  This has been discussed with patient and son.  Ms. Kaczmarczyk appears to be agreeable to initiation of dialysis.  Labs are pending from this morning   LOS: [place] @TODAY @8 :29 AM

## 2020-01-11 LAB — CBC
HCT: 26.1 % — ABNORMAL LOW (ref 36.0–46.0)
Hemoglobin: 7.9 g/dL — ABNORMAL LOW (ref 12.0–15.0)
MCH: 27.1 pg (ref 26.0–34.0)
MCHC: 30.3 g/dL (ref 30.0–36.0)
MCV: 89.4 fL (ref 80.0–100.0)
Platelets: 358 10*3/uL (ref 150–400)
RBC: 2.92 MIL/uL — ABNORMAL LOW (ref 3.87–5.11)
RDW: 16.6 % — ABNORMAL HIGH (ref 11.5–15.5)
WBC: 8.7 10*3/uL (ref 4.0–10.5)
nRBC: 0 % (ref 0.0–0.2)

## 2020-01-11 LAB — BASIC METABOLIC PANEL
Anion gap: 12 (ref 5–15)
BUN: 128 mg/dL — ABNORMAL HIGH (ref 8–23)
CO2: 33 mmol/L — ABNORMAL HIGH (ref 22–32)
Calcium: 9.1 mg/dL (ref 8.9–10.3)
Chloride: 94 mmol/L — ABNORMAL LOW (ref 98–111)
Creatinine, Ser: 3.95 mg/dL — ABNORMAL HIGH (ref 0.44–1.00)
GFR calc Af Amer: 13 mL/min — ABNORMAL LOW (ref >60)
GFR calc non Af Amer: 11 mL/min — ABNORMAL LOW (ref >60)
Glucose, Bld: 170 mg/dL — ABNORMAL HIGH (ref 70–99)
Potassium: 5.9 mmol/L — ABNORMAL HIGH (ref 3.5–5.1)
Sodium: 139 mmol/L (ref 135–145)

## 2020-01-11 LAB — PARATHYROID HORMONE, INTACT (NO CA): PTH: 41 pg/mL (ref 15–65)

## 2020-01-11 LAB — GLUCOSE, CAPILLARY
Glucose-Capillary: 122 mg/dL — ABNORMAL HIGH (ref 70–99)
Glucose-Capillary: 190 mg/dL — ABNORMAL HIGH (ref 70–99)
Glucose-Capillary: 149 mg/dL — ABNORMAL HIGH (ref 70–99)
Glucose-Capillary: 184 mg/dL — ABNORMAL HIGH (ref 70–99)

## 2020-01-11 LAB — MAGNESIUM: Magnesium: 2.4 mg/dL (ref 1.7–2.4)

## 2020-01-11 MED ORDER — SODIUM ZIRCONIUM CYCLOSILICATE 10 G PO PACK
10.0000 g | PACK | Freq: Once | ORAL | Status: AC
  Administered 2020-01-11: 10 g via ORAL
  Filled 2020-01-11 (×2): qty 1

## 2020-01-11 NOTE — Progress Notes (Signed)
PROGRESS NOTE  [name] [name] WFU:[id number] DOB: 06-02-1951 DOA: 01/05/2020 PCP: [name], MD  HPI/Recap of past 38 hours: 69 year old female with past medical history of morbid obesity, diabetes mellitus, chronic diastolic heart failure and stage IV/V chronic kidney disease admitted on early morning of 3/31 for UTI with secondary sepsis.  Following admission, patient's blood cultures grew out E. coli as did her urine.  Patient given IV fluids and antibiotics.  Over the next few days, patient's white count has dramatically improved going from 24 on admission down to 10.5 today.  Unfortunately, her renal function has continued to decline going from 3.3 baseline to 3.9 on admission and has continued to climb and is at 4.95 as of today.  Patient herself states that she feels a little bit better, although very tired.  Nephrology consulted and it is suspected that likely with her sepsis, her already very weak kidneys have continued to receive damage and she will likely need dialysis started during this hospitalization.  Assessment/Plan:  Sepsis secondary to UTI (urinary tract infection)/gram-negative/E. coli bacteremia: Patient met criteria for sepsis on admission given leukocytosis, tachycardia and tachypnea with urinary source.   Urine and blood cultures grew out E. coli, near pansensitive.  Sepsis itself stabilized.  Was on Rocephin.  Antibiotics switched to Keflex.  Tobacco abuse: Counseled to quit, declined nicotine patch  Acute kidney injury in the setting of CKD (chronic kidney disease), stage IV/V ([place]): Followed by nephrology.  Given some IV fluids, her renal function proved but she developed dyspnea.  Fluids were stopped and she was started on Lasix.  Renal function has started to improve since last 2 days.  Feels better but still with some dyspnea.  Still with crackles.  Appreciate nephrology help and defer further management to them.  Per nephrology note, they are still thinking about  possible hemodialysis.   Morbid obesity [facility]): Patient criteria BMI greater than 40  Essential HTN (hypertension): Very well controlled.  Continue home dose of carvedilol.  DM (diabetes mellitus), type 2 with renal complications [facility]): Blood sugar fairly stable now.  Continue current NPH.  Continue to hold premeal that she was on and became hypoglycemic.  Continue SSI.    Acute on chronic systolic CHF (congestive heart failure) ([place]) causing acute respiratory failure with hypoxia: Last echo in June 2020 shows ejection fraction of 40 to 45%.  She is on diuretics now.  Still has basal crackles.  Oxygen requirement improving.  Currently on 2 L nasal correction.  Encourage incentive spirometry.  Anemia of chronic disease: Baseline hemoglobin between 10 and 11.  Currently 7.9.  No indication of transfusion.  Monitor.  Code Status: Full code  Family Communication: Husband at the bedside.  Patient from: Home  Disposition: To be determined based on clinical course.  PT evaluation pending.  Barriers to discharge: monitor renal function and patient will may need dialysis during this hospitalization.    Consultants:  Nephrology  Procedures:  None  Antimicrobials:  IV Rocephin 3/31-4/2  Keflex 4/2-present  DVT prophylaxis: SCDs  Subjective: Seen and examined.  Husband the bedside.  She still complains of shortness of breath and some burning pain in bilateral lower extremities due to chronic neuropathy.  No other complaint.  Objective: Vitals:   01/11/20 0434 01/11/20 0840  BP: 134/64   Pulse: 66   Resp:    Temp: 97.8 F (36.6 C)   SpO2: 100% 100%    Intake/Output Summary (Last 24 hours) at 01/11/2020 1337 Last data filed  at 01/11/2020 0900 Gross per 24 hour  Intake 720.2 ml  Output 1375 ml  Net -654.8 ml   Filed Weights   01/05/20 1828 01/06/20 0158  Weight: 113.4 kg 113.4 kg   Body mass index is 44.29 kg/m.  Exam:  General exam: Appears calm and comfortable   Respiratory system: Crackles at the bases bilaterally. Respiratory effort normal. Cardiovascular system: S1 & S2 heard, RRR. No JVD, murmurs, rubs, gallops or clicks.  +1 pitting edema bilateral lower extremity Gastrointestinal system: Abdomen is nondistended, soft and nontender. No organomegaly or masses felt. Normal bowel sounds heard. Central nervous system: Alert and oriented. No focal neurological deficits. Extremities: Symmetric 5 x 5 power. Skin: No rashes, lesions or ulcers.  Psychiatry: Judgement and insight appear poor. Mood & affect appropriate.   Data Reviewed: CBC: Recent Labs  Lab 01/07/20 0327 01/07/20 0327 01/08/20 0347 01/08/20 1355 01/09/20 0451 01/10/20 0609 01/11/20 1010  WBC 16.2*  --  10.5  --  8.2 7.0 8.7  HGB 8.2*   < > 7.6* 8.5* 7.6* 7.7* 7.9*  HCT 25.7*   < > 24.4* 27.5* 24.1* 25.5* 26.1*  MCV 86.5  --  89.1  --  88.9 89.8 89.4  PLT 245  --  246  --  261 290 358   < > = values in this interval not displayed.   Basic Metabolic Panel: Recent Labs  Lab 01/07/20 0327 01/08/20 0347 01/09/20 0451 01/10/20 0609 01/11/20 1010  NA 131* 133* 135 140 139  K 4.1 4.7 4.5 4.5 5.9*  CL 86* 87* 89* 94* 94*  CO2 30 31 32 29 33*  GLUCOSE 240* 196* 144* 77 170*  BUN 118* 130* 131* 127* 128*  CREATININE 4.44* 4.95* 4.49* 3.84* 3.95*  CALCIUM 8.7* 8.5* 8.3* 8.6* 9.1  MG 1.6* 1.8  --   --  2.4  PHOS  --  5.9*  --   --   --    GFR: Estimated Creatinine Clearance: 16.3 mL/min (A) (by C-G formula based on SCr of 3.95 mg/dL (H)). Liver Function Tests: Recent Labs  Lab 01/05/20 1901 01/08/20 0347 01/09/20 0451 01/10/20 0609  AST '30 28 20 23  ' ALT '17 19 17 17  ' ALKPHOS 57 54 51 51  BILITOT 0.3 0.6 0.7 0.4  PROT 6.2* 5.8* 5.8* 6.3*  ALBUMIN 2.9* 2.3* 2.2* 2.4*   Recent Labs  Lab 01/05/20 1901  LIPASE 25   No results for input(s): AMMONIA in the last 168 hours. Coagulation Profile: Recent Labs  Lab 01/06/20 0153  INR 1.2   Cardiac Enzymes: Recent  Labs  Lab 01/08/20 0347  CKTOTAL 81   BNP (last 3 results) No results for input(s): PROBNP in the last 8760 hours. HbA1C: No results for input(s): HGBA1C in the last 72 hours. CBG: Recent Labs  Lab 01/10/20 1214 01/10/20 1655 01/10/20 2218 01/11/20 0739 01/11/20 1207  GLUCAP 116* 182* 153* 122* 190*   Lipid Profile: No results for input(s): CHOL, HDL, LDLCALC, TRIG, CHOLHDL, LDLDIRECT in the last 72 hours. Thyroid Function Tests: No results for input(s): TSH, T4TOTAL, FREET4, T3FREE, THYROIDAB in the last 72 hours. Anemia Panel: Recent Labs    01/08/20 1355  TIBC 351  IRON 13*   Urine analysis:    Component Value Date/Time   COLORURINE AMBER (A) 01/05/2020 2110   APPEARANCEUR CLOUDY (A) 01/05/2020 2110   LABSPEC 1.013 01/05/2020 2110   PHURINE 5.0 01/05/2020 2110   GLUCOSEU NEGATIVE 01/05/2020 2110   HGBUR LARGE (A) 01/05/2020 2110  [place] NEGATIVE 01/05/2020 2110   [place] NEGATIVE 01/05/2020 2110   PROTEINUR 100 (A) 01/05/2020 2110   UROBILINOGEN 0.2 08/04/2014 1409   NITRITE NEGATIVE 01/05/2020 2110   LEUKOCYTESUR MODERATE (A) 01/05/2020 2110   Sepsis Labs: '@LABRCNTIP' (procalcitonin:4,lacticidven:4)  ) Recent Results (from the past 240 hour(s))  Blood Culture (routine x 2)     Status: Abnormal   Collection Time: 01/06/20  1:53 AM   Specimen: BLOOD  Result Value Ref Range Status   Specimen Description BLOOD RIGHT ARM  Final   Special Requests   Final    BOTTLES DRAWN AEROBIC AND ANAEROBIC Blood Culture adequate volume   Culture  Setup Time   Final    GRAM NEGATIVE RODS IN BOTH AEROBIC AND ANAEROBIC BOTTLES CRITICAL RESULT CALLED TO, READ BACK BY AND VERIFIED WITH: Starlyn Skeans PharmD 16:20 01/06/20 ([email or web address]) Performed at [facility], [place] [address] [place], [place] [address]    Culture ESCHERICHIA COLI (A)  Final   Report Status 01/08/2020 FINAL  Final   Organism ID, Bacteria ESCHERICHIA COLI  Final      Susceptibility   Escherichia coli  - MIC*    AMPICILLIN <=2 SENSITIVE Sensitive     CEFAZOLIN <=4 SENSITIVE Sensitive     CEFEPIME <=0.12 SENSITIVE Sensitive     CEFTAZIDIME <=1 SENSITIVE Sensitive     CEFTRIAXONE <=0.25 SENSITIVE Sensitive     CIPROFLOXACIN <=0.25 SENSITIVE Sensitive     GENTAMICIN <=1 SENSITIVE Sensitive     IMIPENEM <=0.25 SENSITIVE Sensitive     TRIMETH/SULFA <=20 SENSITIVE Sensitive     AMPICILLIN/SULBACTAM <=2 SENSITIVE Sensitive     PIP/TAZO <=4 SENSITIVE Sensitive     * ESCHERICHIA COLI  Blood Culture ID Panel (Reflexed)     Status: Abnormal   Collection Time: 01/06/20  1:53 AM  Result Value Ref Range Status   Enterococcus species NOT DETECTED NOT DETECTED Final   Listeria monocytogenes NOT DETECTED NOT DETECTED Final   Staphylococcus species NOT DETECTED NOT DETECTED Final   Staphylococcus aureus (BCID) NOT DETECTED NOT DETECTED Final   Streptococcus species NOT DETECTED NOT DETECTED Final   Streptococcus agalactiae NOT DETECTED NOT DETECTED Final   Streptococcus pneumoniae NOT DETECTED NOT DETECTED Final   Streptococcus pyogenes NOT DETECTED NOT DETECTED Final   Acinetobacter baumannii NOT DETECTED NOT DETECTED Final   Enterobacteriaceae species DETECTED (A) NOT DETECTED Final    Comment: Enterobacteriaceae represent a large family of gram-negative bacteria, not a single organism. CRITICAL RESULT CALLED TO, READ BACK BY AND VERIFIED WITH: Starlyn Skeans PharmD 16:20 01/06/20 ([email or web address])    Enterobacter cloacae complex NOT DETECTED NOT DETECTED Final   Escherichia coli DETECTED (A) NOT DETECTED Final    Comment: CRITICAL RESULT CALLED TO, READ BACK BY AND VERIFIED WITH: Starlyn Skeans PharmD 16:20 01/06/20 ([email or web address])    Klebsiella oxytoca NOT DETECTED NOT DETECTED Final   Klebsiella pneumoniae NOT DETECTED NOT DETECTED Final   Proteus species NOT DETECTED NOT DETECTED Final   Serratia marcescens NOT DETECTED NOT DETECTED Final   Carbapenem resistance NOT DETECTED NOT DETECTED Final   Haemophilus  influenzae NOT DETECTED NOT DETECTED Final   Neisseria meningitidis NOT DETECTED NOT DETECTED Final   Pseudomonas aeruginosa NOT DETECTED NOT DETECTED Final   Candida albicans NOT DETECTED NOT DETECTED Final   Candida glabrata NOT DETECTED NOT DETECTED Final   Candida krusei NOT DETECTED NOT DETECTED Final   Candida parapsilosis NOT DETECTED NOT DETECTED Final   Candida tropicalis NOT DETECTED NOT DETECTED Final  Comment: Performed at [facility], [place] [address] [place], [place] [address]  SARS CORONAVIRUS 2 (TAT 6-24 HRS) Nasopharyngeal Nasopharyngeal Swab     Status: None   Collection Time: 01/06/20  1:57 AM   Specimen: Nasopharyngeal Swab  Result Value Ref Range Status   SARS Coronavirus 2 NEGATIVE NEGATIVE Final    Comment: (NOTE) SARS-CoV-2 target nucleic acids are NOT DETECTED. The SARS-CoV-2 RNA is generally detectable in upper and lower respiratory specimens during the acute phase of infection. Negative results do not preclude SARS-CoV-2 infection, do not rule out co-infections with other pathogens, and should not be used as the sole basis for treatment or other patient management decisions. Negative results must be combined with clinical observations, patient history, and epidemiological information. The expected result is Negative. Fact Sheet for Patients: SugarRoll.be Fact Sheet for Healthcare Providers: https://www.woods-mathews.com/ This test is not yet approved or cleared by the [place] FDA and  has been authorized for detection and/or diagnosis of SARS-CoV-2 by FDA under an Emergency Use Authorization (EUA). This EUA will remain  in effect (meaning this test can be used) for the duration of the COVID-19 declaration under Section 56 4(b)(1) of the Act, 21 U.S.C. section 360bbb-3(b)(1), unless the authorization is terminated or revoked sooner. Performed at [facility], [place] [address], [place],  [place] [address]   Blood Culture (routine x 2)     Status: Abnormal   Collection Time: 01/06/20  2:42 AM   Specimen: BLOOD  Result Value Ref Range Status   Specimen Description BLOOD LEFT ARM  Final   Special Requests   Final    BOTTLES DRAWN AEROBIC AND ANAEROBIC Blood Culture adequate volume   Culture  Setup Time   Final    GRAM NEGATIVE RODS IN BOTH AEROBIC AND ANAEROBIC BOTTLES CRITICAL VALUE NOTED.  VALUE IS CONSISTENT WITH PREVIOUSLY REPORTED AND CALLED VALUE.    Culture (A)  Final    ESCHERICHIA COLI SUSCEPTIBILITIES PERFORMED ON PREVIOUS CULTURE WITHIN THE LAST 5 DAYS. Performed at [facility], [place] [address], [place], [place] [address]    Report Status 01/08/2020 FINAL  Final  Urine culture     Status: Abnormal   Collection Time: 01/06/20  4:04 PM   Specimen: In/Out Cath Urine  Result Value Ref Range Status   Specimen Description IN/OUT CATH URINE  Final   Special Requests   Final    NONE Performed at [facility], [place] [address], [place], [place] [address]    Culture 5,000 COLONIES/mL ESCHERICHIA COLI (A)  Final   Report Status 01/08/2020 FINAL  Final   Organism ID, Bacteria ESCHERICHIA COLI (A)  Final      Susceptibility   Escherichia coli - MIC*    AMPICILLIN <=2 SENSITIVE Sensitive     CEFAZOLIN <=4 SENSITIVE Sensitive     CEFTRIAXONE <=0.25 SENSITIVE Sensitive     CIPROFLOXACIN <=0.25 SENSITIVE Sensitive     GENTAMICIN <=1 SENSITIVE Sensitive     IMIPENEM <=0.25 SENSITIVE Sensitive     NITROFURANTOIN <=16 SENSITIVE Sensitive     TRIMETH/SULFA <=20 SENSITIVE Sensitive     AMPICILLIN/SULBACTAM <=2 SENSITIVE Sensitive     PIP/TAZO <=4 SENSITIVE Sensitive     * 5,000 COLONIES/mL ESCHERICHIA COLI      Studies: No results found.  Scheduled Meds: . allopurinol  100 mg Oral Daily  . bisacodyl  10 mg Rectal Daily  . carvedilol  12.5 mg Oral BID  . cephALEXin  500  mg Oral Q8H  . cholecalciferol  2,000 Units Oral Daily  . cinacalcet  30 mg Oral Q  breakfast  . clopidogrel  75 mg Oral Daily  . fenofibrate  160 mg Oral Daily  . heparin  5,000 Units Subcutaneous Q8H  . hydrOXYzine  25 mg Oral BID  . insulin aspart  0-15 Units Subcutaneous TID WC  . insulin NPH Human  20 Units Subcutaneous BID AC & HS  . levothyroxine  150 mcg Oral Q0600  . magnesium oxide  400 mg Oral BID  . mirabegron ER  25 mg Oral Daily  . mometasone-formoterol  2 puff Inhalation BID  . multivitamin with minerals  1 tablet Oral Daily  . pantoprazole  40 mg Oral Daily  . polyethylene glycol  17 g Oral Daily  . simvastatin  20 mg Oral QHS  . sodium zirconium cyclosilicate  10 g Oral Once  . tiZANidine  4 mg Oral BID    Continuous Infusions: . ferric gluconate (FERRLECIT/NULECIT) IV 250 mg (01/11/20 1057)     LOS: 4 days    Total time spent 30 minutes Darliss Cheney, MD [facility]   01/11/2020, 1:37 PM

## 2020-01-11 NOTE — Progress Notes (Addendum)
[name]  Assessment/ Plan: Pt is a 69 y.o. yo female history of CKD stage IV with baseline creatinine level between 3-4, hypertension, DM, CHF presented with sepsis due to E. coli/UTI.  #Acute kidney injury on CKD stage IV likely due to sepsis and the hemodynamic changes with the use of diuretics.  UA with UTI.  CT scan with no obstruction.  Currently on Lasix 80 mg IV BID with urine output 1.3 L.  Creatinine level is stable however BUN is still elevated.  The leg edema is better therefore plan to hold off Lasix today.  I have discussed with the patient and her sister today about possible need of dialysis if BUN continues to remain elevated or with any uremic symptoms.  Plan Is to wait next 1 to 2 days to see the renal function.  No urgent indication for HD today however, I think she will need dialysis during this hospitalization.  Vein mapping was already done.  Sepsis due to UTI/E. coli bacteremia: Now antibiotics changed to Keflex.  Per primary team.  #Hypertension: Monitor BP.  On Coreg.  #Anemia of CKD: Iron saturation only 4%.  Currently on IV iron.  Seems like she recovered from sepsis.  #Secondary hyperparathyroidism: PTH level 79 on 12/04/2019.  Check phosphorus level.  Continue Sensipar.  #Hyperkalemia: She was receiving potassium chloride which was discontinued today.  Ordered a dose of Lokelma.  Monitor lab.  Subjective: Seen and examined at bedside.  Denies nausea vomiting chest pain shortness of breath.  Sitting on chair comfortable.  Urine output of 1275 cc.  Her sister was on phone. Objective Vital signs in last 24 hours: Vitals:   01/10/20 2008 01/11/20 0434 01/11/20 0840 01/11/20 1344  BP: (!) 117/47 134/64  124/83  Pulse: 71 66  77  Resp: 18   20  Temp: 98.3 F (36.8 C) 97.8 F (36.6 C)  98.4 F (36.9 C)  TempSrc: Oral Oral  Oral  SpO2: 100% 100% 100% 94%  Weight:      Height:       Weight change:   Intake/Output  Summary (Last 24 hours) at 01/11/2020 1351 Last data filed at 01/11/2020 0900 Gross per 24 hour  Intake 720.2 ml  Output 1375 ml  Net -654.8 ml       Labs: Basic Metabolic Panel: Recent Labs  Lab 01/08/20 0347 01/08/20 0347 01/09/20 0451 01/10/20 0609 01/11/20 1010  NA 133*   < > 135 140 139  K 4.7   < > 4.5 4.5 5.9*  CL 87*   < > 89* 94* 94*  CO2 31   < > 32 29 33*  GLUCOSE 196*   < > 144* 77 170*  BUN 130*   < > 131* 127* 128*  CREATININE 4.95*   < > 4.49* 3.84* 3.95*  CALCIUM 8.5*   < > 8.3* 8.6* 9.1  PHOS 5.9*  --   --   --   --    < > = values in this interval not displayed.   Liver Function Tests: Recent Labs  Lab 01/08/20 0347 01/09/20 0451 01/10/20 0609  AST 28 20 23   ALT 19 17 17   ALKPHOS 54 51 51  BILITOT 0.6 0.7 0.4  PROT 5.8* 5.8* 6.3*  ALBUMIN 2.3* 2.2* 2.4*   Recent Labs  Lab 01/05/20 1901  LIPASE 25   No results for input(s): AMMONIA in the last 168 hours. CBC: Recent Labs  Lab 01/07/20 0327 01/07/20 0327  01/08/20 0347 01/08/20 1355 01/09/20 0451 01/10/20 0609 01/11/20 1010  WBC 16.2*   < > 10.5  --  8.2 7.0 8.7  HGB 8.2*   < > 7.6*   < > 7.6* 7.7* 7.9*  HCT 25.7*   < > 24.4*   < > 24.1* 25.5* 26.1*  MCV 86.5  --  89.1  --  88.9 89.8 89.4  PLT 245   < > 246  --  261 290 358   < > = values in this interval not displayed.   Cardiac Enzymes: Recent Labs  Lab 01/08/20 0347  CKTOTAL 81   CBG: Recent Labs  Lab 01/10/20 1214 01/10/20 1655 01/10/20 2218 01/11/20 0739 01/11/20 1207  GLUCAP 116* 182* 153* 122* 190*    Iron Studies:  Recent Labs    01/08/20 1355  IRON 13*  TIBC 351   Studies/Results: No results found.  Medications: Infusions: . ferric gluconate (FERRLECIT/NULECIT) IV 250 mg (01/11/20 1057)    Scheduled Medications: . allopurinol  100 mg Oral Daily  . bisacodyl  10 mg Rectal Daily  . carvedilol  12.5 mg Oral BID  . cephALEXin  500 mg Oral Q8H  . cholecalciferol  2,000 Units Oral Daily  .  cinacalcet  30 mg Oral Q breakfast  . clopidogrel  75 mg Oral Daily  . fenofibrate  160 mg Oral Daily  . heparin  5,000 Units Subcutaneous Q8H  . hydrOXYzine  25 mg Oral BID  . insulin aspart  0-15 Units Subcutaneous TID WC  . insulin NPH Human  20 Units Subcutaneous BID AC & HS  . levothyroxine  150 mcg Oral Q0600  . magnesium oxide  400 mg Oral BID  . mirabegron ER  25 mg Oral Daily  . mometasone-formoterol  2 puff Inhalation BID  . multivitamin with minerals  1 tablet Oral Daily  . pantoprazole  40 mg Oral Daily  . polyethylene glycol  17 g Oral Daily  . simvastatin  20 mg Oral QHS  . sodium zirconium cyclosilicate  10 g Oral Once  . tiZANidine  4 mg Oral BID    have reviewed scheduled and prn medications.  Physical Exam: General:NAD, comfortable Heart:RRR, s1s2 nl Lungs:clear b/l, no crackle Abdomen:soft, Non-tender, non-distended Extremities: Lower extremity not pitting edema seems chronic Neurology: Alert awake and following commands.  [name] [name] 01/11/2020,1:51 PM  LOS: 4 days  Pager: [phone]

## 2020-01-11 NOTE — Progress Notes (Signed)
Physical Therapy Treatment Patient Details Name: [name] [name] MRN: [id number] DOB: [birth year] Today's Date: 01/11/2020    History of Present Illness 69 y.o. 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 Pt presents to ED with 2 day h/o lower abd pain, associated N/V.    PT Comments    Pt received sitting up in recliner and agreeable to PT. Pt reporting a few times during session that she needed to get herself together today. Pt required min A to rise from recliner and min guard assist for ambulation. Pt ambulated with slightly improved distance with increased reliance on RW and very slow gait speed with short strides. Pt received on RA with O2 sats upper 90s. Unable to get accurate O2 reading on portable pulse ox during gait but pt reporting min SOB during ambulation requiring one standing rest break. Pt fatigues quickly requesting to sit down. Pt returned to room for seated rest on bed. O2 sats 92% once sitting EOB post ambulation. Pt reporting moderate pain in ribs, mid thoracic area more on the right side. Pt required min A for LE advancement returning to bed. Pt able to scoot up in bed with heavy use of bed rails. Pt frustrated at end of ambulation over performance frequently repeating that she has to do better. Pt encouraged to continue working hard with therapy. Pt becoming agitated end of session due to timing for nursing responses from earlier in the day.   Follow Up Recommendations  Home health PT;Supervision/Assistance - 24 hour     Equipment Recommendations  Rolling walker with 5" wheels    Recommendations for Other Services       Precautions / Restrictions Precautions Precautions: Fall Restrictions Weight Bearing Restrictions: No    Mobility  Bed Mobility Overal bed mobility: Needs Assistance Bed Mobility: Sit to Supine       Sit to supine: Min assist   General bed mobility comments: min A for LE advacement to return to  supine, received in recliner, heavy use of bed rails for scooting and repositioning  Transfers Overall transfer level: Needs assistance Equipment used: Rolling walker (2 wheeled) Transfers: Sit to/from Stand Sit to Stand: Min assist;Min guard         General transfer comment: min A to stand from recliner, min guard to stand from bed slightly elevated end of session  Ambulation/Gait Ambulation/Gait assistance: Min guard Gait Distance (Feet): 40 Feet Assistive device: 4-wheeled walker Gait Pattern/deviations: Step-through pattern;Decreased stride length;Trunk flexed Gait velocity: reduced   General Gait Details: pt ambulating with RW, dec stride length and slow gait speed, good RW mgt, fatigues quickly   Stairs             Wheelchair Mobility    Modified Rankin (Stroke Patients Only)       Balance Overall balance assessment: Needs assistance   Sitting balance-Leahy Scale: Good     Standing balance support: Bilateral upper extremity supported Standing balance-Leahy Scale: Fair Standing balance comment: minG with BUE support of RW                            Cognition Arousal/Alertness: Awake/alert Behavior During Therapy: WFL for tasks assessed/performed Overall Cognitive Status: Impaired/Different from baseline                               Problem Solving: Slow processing General  Comments: pt with slow processing today, frequently stating she needed to get herself together      Exercises General Exercises - Lower Extremity Long Arc Quad: AROM;Both;10 reps Hip Flexion/Marching: AROM;Both;10 reps;Seated Toe Raises: AROM;Both;10 reps;Seated    General Comments General comments (skin integrity, edema, etc.): pt received on RA with O2 sats upper 90s, unable to get accurate reading from pulse ox during ambulation one standing rest break pt reporting mild SOB, o2 sats 92% sitting EOB post ambulation on RA      Pertinent Vitals/Pain  Faces Pain Scale: Hurts even more Pain Location: low back Pain Descriptors / Indicators: Aching;Grimacing Pain Intervention(s): Monitored during session;Repositioned    Home Living                      Prior Function            PT Goals (current goals can now be found in the care plan section) Progress towards PT goals: Progressing toward goals    Frequency    Min 3X/week      PT Plan Current plan remains appropriate    Co-evaluation              AM-PAC PT "6 Clicks" Mobility   Outcome Measure  Help needed turning from your back to your side while in a flat bed without using bedrails?: A Little Help needed moving from lying on your back to sitting on the side of a flat bed without using bedrails?: A Little Help needed moving to and from a bed to a chair (including a wheelchair)?: A Little Help needed standing up from a chair using your arms (e.g., wheelchair or bedside chair)?: A Little Help needed to walk in hospital room?: A Little Help needed climbing 3-5 steps with a railing? : A Lot 6 Click Score: 17    End of Session Equipment Utilized During Treatment: Gait belt Activity Tolerance: Patient tolerated treatment well;Patient limited by fatigue Patient left: in bed;with call bell/phone within reach;with bed alarm set Nurse Communication: Mobility status PT Visit Diagnosis: Muscle weakness (generalized) (M62.81)     Time: 0240-9735 PT Time Calculation (min) (ACUTE ONLY): 36 min  Charges:  $Therapeutic Exercise: 23-37 mins                     Zachary George PT, DPT 3:29 PM,01/11/20    Jarl Sellitto Drucilla Chalet 01/11/2020, 3:24 PM

## 2020-01-11 NOTE — Progress Notes (Signed)
PT Cancellation Note  Patient Details Name: [name] [name] MRN: [id number] DOB: [birth year]   Cancelled Treatment:    Reason Eval/Treat Not Completed: Patient declined, no reason specified. Upon arrival for PT treat, pt declining therapy so she could eat breakfast that just arrived. PT attempted a second time an hour later with pt still working on breakfast tray and requesting some more time to get herself together. Pt reported she was eager to get up and participate later. PT will follow up at later time as schedule allows.   Zachary George PT, DPT 10:03 AM,01/11/20    [name] [name] 01/11/2020, 10:02 AM

## 2020-01-11 NOTE — Plan of Care (Signed)
  Problem: Education: Goal: Knowledge of General Education information will improve Description Including pain rating scale, medication(s)/side effects and non-pharmacologic comfort measures Outcome: Progressing   

## 2020-01-12 LAB — RENAL FUNCTION PANEL
Albumin: 2.3 g/dL — ABNORMAL LOW (ref 3.5–5.0)
Albumin: 2.6 g/dL — ABNORMAL LOW (ref 3.5–5.0)
Anion gap: 11 (ref 5–15)
Anion gap: 11 (ref 5–15)
BUN: 129 mg/dL — ABNORMAL HIGH (ref 8–23)
BUN: 124 mg/dL — ABNORMAL HIGH (ref 8–23)
CO2: 30 mmol/L (ref 22–32)
CO2: 32 mmol/L (ref 22–32)
Calcium: 8.7 mg/dL — ABNORMAL LOW (ref 8.9–10.3)
Calcium: 9.2 mg/dL (ref 8.9–10.3)
Chloride: 95 mmol/L — ABNORMAL LOW (ref 98–111)
Chloride: 96 mmol/L — ABNORMAL LOW (ref 98–111)
Creatinine, Ser: 3.98 mg/dL — ABNORMAL HIGH (ref 0.44–1.00)
Creatinine, Ser: 4.28 mg/dL — ABNORMAL HIGH (ref 0.44–1.00)
GFR calc Af Amer: 13 mL/min — ABNORMAL LOW (ref >60)
GFR calc Af Amer: 11 mL/min — ABNORMAL LOW (ref >60)
GFR calc non Af Amer: 11 mL/min — ABNORMAL LOW (ref >60)
GFR calc non Af Amer: 10 mL/min — ABNORMAL LOW (ref >60)
Glucose, Bld: 300 mg/dL — ABNORMAL HIGH (ref 70–99)
Glucose, Bld: 211 mg/dL — ABNORMAL HIGH (ref 70–99)
Phosphorus: 2.8 mg/dL (ref 2.5–4.6)
Phosphorus: 3.1 mg/dL (ref 2.5–4.6)
Potassium: 6.5 mmol/L (ref 3.5–5.1)
Potassium: 6 mmol/L — ABNORMAL HIGH (ref 3.5–5.1)
Sodium: 136 mmol/L (ref 135–145)
Sodium: 139 mmol/L (ref 135–145)

## 2020-01-12 LAB — CBC
HCT: 25.3 % — ABNORMAL LOW (ref 36.0–46.0)
Hemoglobin: 7.7 g/dL — ABNORMAL LOW (ref 12.0–15.0)
MCH: 27.6 pg (ref 26.0–34.0)
MCHC: 30.4 g/dL (ref 30.0–36.0)
MCV: 90.7 fL (ref 80.0–100.0)
Platelets: 393 10*3/uL (ref 150–400)
RBC: 2.79 MIL/uL — ABNORMAL LOW (ref 3.87–5.11)
RDW: 16.9 % — ABNORMAL HIGH (ref 11.5–15.5)
WBC: 9.9 10*3/uL (ref 4.0–10.5)
nRBC: 0.2 % (ref 0.0–0.2)

## 2020-01-12 LAB — GLUCOSE, CAPILLARY
Glucose-Capillary: 165 mg/dL — ABNORMAL HIGH (ref 70–99)
Glucose-Capillary: 185 mg/dL — ABNORMAL HIGH (ref 70–99)
Glucose-Capillary: 198 mg/dL — ABNORMAL HIGH (ref 70–99)
Glucose-Capillary: 260 mg/dL — ABNORMAL HIGH (ref 70–99)

## 2020-01-12 LAB — POTASSIUM: Potassium: 6 mmol/L — ABNORMAL HIGH (ref 3.5–5.1)

## 2020-01-12 MED ORDER — SODIUM ZIRCONIUM CYCLOSILICATE 10 G PO PACK
10.0000 g | PACK | Freq: Every day | ORAL | Status: DC
  Administered 2020-01-12 – 2020-01-14 (×3): 10 g via ORAL
  Filled 2020-01-12 (×3): qty 1

## 2020-01-12 MED ORDER — FUROSEMIDE 10 MG/ML IJ SOLN
80.0000 mg | Freq: Two times a day (BID) | INTRAMUSCULAR | Status: DC
  Administered 2020-01-12 – 2020-01-13 (×3): 80 mg via INTRAVENOUS
  Filled 2020-01-12 (×3): qty 8

## 2020-01-12 MED ORDER — DARBEPOETIN ALFA 60 MCG/0.3ML IJ SOSY
60.0000 ug | PREFILLED_SYRINGE | Freq: Once | INTRAMUSCULAR | Status: AC
  Administered 2020-01-12: 60 ug via SUBCUTANEOUS
  Filled 2020-01-12: qty 0.3

## 2020-01-12 NOTE — Plan of Care (Signed)
  Problem: Education: Goal: Knowledge of General Education information will improve Description Including pain rating scale, medication(s)/side effects and non-pharmacologic comfort measures Outcome: Progressing   

## 2020-01-12 NOTE — Progress Notes (Signed)
CRITICAL VALUE ALERT  Critical Value:  Potassium 6.5  Date & Time Notied:  0530  Provider Notified: Lang Snow  Orders Received/Actions taken: orders received

## 2020-01-12 NOTE — Progress Notes (Signed)
PROGRESS NOTE  [name] [name] VQQ:[id number] DOB: 1951-07-09 DOA: 01/05/2020 PCP: [name], MD  Brief Hospital course: 69 year old female with past medical history of morbid obesity, diabetes mellitus, chronic diastolic heart failure and stage IV/V chronic kidney disease admitted on early morning of 3/31 for UTI with secondary sepsis.  Following admission, patient's blood cultures grew out E. coli as did her urine.  Patient given IV fluids and antibiotics.  Over the next few days, patient's white count has dramatically improved going from 24 on admission down to normal.  Unfortunately, her renal function has continued to decline going from 3.3 baseline to 3.9 on admission and has continued to climb and is at 4.95 and now has plateaued around 3.9 since last 3 days. Nephrology consulted and it is suspected that likely with her sepsis, her already very weak kidneys have continued to receive damage and she will likely need dialysis started during this hospitalization.  Assessment/Plan:  Sepsis secondary to UTI (urinary tract infection)/gram-negative/E. coli bacteremia: Patient met criteria for sepsis on admission given leukocytosis, tachycardia and tachypnea with urinary source.   Urine and blood cultures grew out E. coli, near pansensitive.  Sepsis itself stabilized.  Was on Rocephin.  Antibiotics switched to Keflex.  Tobacco abuse: Counseled to quit, declined nicotine patch  Acute kidney injury in the setting of CKD (chronic kidney disease), stage IV/V ([place]): Followed by nephrology.  Given some IV fluids, her renal function proved but she developed dyspnea.  Fluids were stopped and she was started on Lasix.  Renal function has plateaued since last 3 days.  Was on Lasix for last 3 days but evening dose on 01/11/2020 was held.  Now feels some dyspnea.  Still with bibasilar crackles.  Now also has hyperkalemia and early signs of uremia.  Nephrology has recommended her dialysis however she has not  made a final decision yet.  Her Lasix has been resumed.  Hyperkalemia: Lasix resumed.  She is receiving Lokelma ordered by nephrology.   Morbid obesity [facility]): Patient criteria BMI greater than 40  Essential HTN (hypertension): Very well controlled.  Continue home dose of carvedilol.  DM (diabetes mellitus), type 2 with renal complications [facility]): Blood sugar fairly stable now.  Continue current NPH.  Continue to hold premeal that she was on and became hypoglycemic.  Continue SSI.    Acute on chronic systolic CHF (congestive heart failure) ([place]) causing acute respiratory failure with hypoxia: Last echo in June 2020 shows ejection fraction of 40 to 45%.  She is on diuretics now.  Still has basal crackles.  Oxygen requirement improving.  Currently on 2 L nasal correction.  Encourage incentive spirometry.  Anemia of chronic disease: Baseline hemoglobin between 10 and 11.  Currently 7.7.  No indication of transfusion.  Monitor.  Code Status: Full code  Family Communication: Husband was at the bedside yesterday but no family at bedside today.  Patient from: Home  Disposition: To be determined based on clinical course.  PT evaluation pending.  Barriers to discharge: monitor renal function and patient may need dialysis during this hospitalization.    Consultants:  Nephrology  Procedures:  None  Antimicrobials:  IV Rocephin 3/31-4/2  Keflex 4/2-present  DVT prophylaxis: SCDs  Subjective: Seen and examined.  Complains of shortness of breath and weakness.  No other complaint.  Not 100% ready for dialysis yet.  Objective: Vitals:   01/11/20 2052 01/12/20 0558  BP: 128/80 121/78  Pulse: 75 81  Resp: 18 18  Temp: 98.6 F (37  C) 98.6 F (37 C)  SpO2: 95% 95%    Intake/Output Summary (Last 24 hours) at 01/12/2020 1101 Last data filed at 01/12/2020 0755 Gross per 24 hour  Intake 240 ml  Output 2130 ml  Net -1890 ml   Filed Weights   01/05/20 1828 01/06/20 0158  Weight:  113.4 kg 113.4 kg   Body mass index is 44.29 kg/m.  Exam:  General exam: Appears calm and comfortable but weak Respiratory system: Bibasilar crackles.  No wheezes.Marland Kitchen Respiratory effort normal. Cardiovascular system: S1 & S2 heard, RRR. No JVD, murmurs, rubs, gallops or clicks.  +2 pitting edema bilateral lower extremity Gastrointestinal system: Abdomen is nondistended, soft and nontender. No organomegaly or masses felt. Normal bowel sounds heard. Central nervous system: Alert and oriented. No focal neurological deficits. Extremities: Symmetric 5 x 5 power. Skin: No rashes, lesions or ulcers.  Psychiatry: Judgement and insight appear poor.  Mood & affect flat.  Data Reviewed: CBC: Recent Labs  Lab 01/08/20 0347 01/08/20 0347 01/08/20 1355 01/09/20 0451 01/10/20 0609 01/11/20 1010 01/12/20 0238  WBC 10.5  --   --  8.2 7.0 8.7 9.9  HGB 7.6*   < > 8.5* 7.6* 7.7* 7.9* 7.7*  HCT 24.4*   < > 27.5* 24.1* 25.5* 26.1* 25.3*  MCV 89.1  --   --  88.9 89.8 89.4 90.7  PLT 246  --   --  261 290 358 393   < > = values in this interval not displayed.   Basic Metabolic Panel: Recent Labs  Lab 01/07/20 0327 01/07/20 0327 01/08/20 0347 01/09/20 0451 01/10/20 0609 01/11/20 1010 01/12/20 0238  NA 131*   < > 133* 135 140 139 136  K 4.1   < > 4.7 4.5 4.5 5.9* 6.5*  CL 86*   < > 87* 89* 94* 94* 95*  CO2 30   < > 31 32 29 33* 30  GLUCOSE 240*   < > 196* 144* 77 170* 300*  BUN 118*   < > 130* 131* 127* 128* 129*  CREATININE 4.44*   < > 4.95* 4.49* 3.84* 3.95* 3.98*  CALCIUM 8.7*   < > 8.5* 8.3* 8.6* 9.1 8.7*  MG 1.6*  --  1.8  --   --  2.4  --   PHOS  --   --  5.9*  --   --   --  2.8   < > = values in this interval not displayed.   GFR: Estimated Creatinine Clearance: 16.2 mL/min (A) (by C-G formula based on SCr of 3.98 mg/dL (H)). Liver Function Tests: Recent Labs  Lab 01/05/20 1901 01/08/20 0347 01/09/20 0451 01/10/20 0609 01/12/20 0238  AST '30 28 20 23  ' --   ALT '17 19 17 17   ' --   ALKPHOS 57 54 51 51  --   BILITOT 0.3 0.6 0.7 0.4  --   PROT 6.2* 5.8* 5.8* 6.3*  --   ALBUMIN 2.9* 2.3* 2.2* 2.4* 2.3*   Recent Labs  Lab 01/05/20 1901  LIPASE 25   No results for input(s): AMMONIA in the last 168 hours. Coagulation Profile: Recent Labs  Lab 01/06/20 0153  INR 1.2   Cardiac Enzymes: Recent Labs  Lab 01/08/20 0347  CKTOTAL 81   BNP (last 3 results) No results for input(s): PROBNP in the last 8760 hours. HbA1C: No results for input(s): HGBA1C in the last 72 hours. CBG: Recent Labs  Lab 01/11/20 0739 01/11/20 1207 01/11/20 1647 01/11/20 2051 01/12/20 4401  GLUCAP 122* 190* 149* 184* 165*   Lipid Profile: No results for input(s): CHOL, HDL, LDLCALC, TRIG, CHOLHDL, LDLDIRECT in the last 72 hours. Thyroid Function Tests: No results for input(s): TSH, T4TOTAL, FREET4, T3FREE, THYROIDAB in the last 72 hours. Anemia Panel: No results for input(s): VITAMINB12, FOLATE, FERRITIN, TIBC, IRON, RETICCTPCT in the last 72 hours. Urine analysis:    Component Value Date/Time   COLORURINE AMBER (A) 01/05/2020 2110   APPEARANCEUR CLOUDY (A) 01/05/2020 2110   LABSPEC 1.013 01/05/2020 2110   PHURINE 5.0 01/05/2020 2110   GLUCOSEU NEGATIVE 01/05/2020 2110   HGBUR LARGE (A) 01/05/2020 2110   BILIRUBINUR NEGATIVE 01/05/2020 2110   KETONESUR NEGATIVE 01/05/2020 2110   PROTEINUR 100 (A) 01/05/2020 2110   UROBILINOGEN 0.2 08/04/2014 1409   NITRITE NEGATIVE 01/05/2020 2110   LEUKOCYTESUR MODERATE (A) 01/05/2020 2110   Sepsis Labs: '@LABRCNTIP' (procalcitonin:4,lacticidven:4)  ) Recent Results (from the past 240 hour(s))  Blood Culture (routine x 2)     Status: Abnormal   Collection Time: 01/06/20  1:53 AM   Specimen: BLOOD  Result Value Ref Range Status   Specimen Description BLOOD RIGHT ARM  Final   Special Requests   Final    BOTTLES DRAWN AEROBIC AND ANAEROBIC Blood Culture adequate volume   Culture  Setup Time   Final    GRAM NEGATIVE RODS IN BOTH  AEROBIC AND ANAEROBIC BOTTLES CRITICAL RESULT CALLED TO, READ BACK BY AND VERIFIED WITH: Starlyn Skeans PharmD 16:20 01/06/20 ([email or web address]) Performed at [facility], [place] [address] [place], [place] [address]    Culture ESCHERICHIA COLI (A)  Final   Report Status 01/08/2020 FINAL  Final   Organism ID, Bacteria ESCHERICHIA COLI  Final      Susceptibility   Escherichia coli - MIC*    AMPICILLIN <=2 SENSITIVE Sensitive     CEFAZOLIN <=4 SENSITIVE Sensitive     CEFEPIME <=0.12 SENSITIVE Sensitive     CEFTAZIDIME <=1 SENSITIVE Sensitive     CEFTRIAXONE <=0.25 SENSITIVE Sensitive     CIPROFLOXACIN <=0.25 SENSITIVE Sensitive     GENTAMICIN <=1 SENSITIVE Sensitive     IMIPENEM <=0.25 SENSITIVE Sensitive     TRIMETH/SULFA <=20 SENSITIVE Sensitive     AMPICILLIN/SULBACTAM <=2 SENSITIVE Sensitive     PIP/TAZO <=4 SENSITIVE Sensitive     * ESCHERICHIA COLI  Blood Culture ID Panel (Reflexed)     Status: Abnormal   Collection Time: 01/06/20  1:53 AM  Result Value Ref Range Status   Enterococcus species NOT DETECTED NOT DETECTED Final   Listeria monocytogenes NOT DETECTED NOT DETECTED Final   Staphylococcus species NOT DETECTED NOT DETECTED Final   Staphylococcus aureus (BCID) NOT DETECTED NOT DETECTED Final   Streptococcus species NOT DETECTED NOT DETECTED Final   Streptococcus agalactiae NOT DETECTED NOT DETECTED Final   Streptococcus pneumoniae NOT DETECTED NOT DETECTED Final   Streptococcus pyogenes NOT DETECTED NOT DETECTED Final   Acinetobacter baumannii NOT DETECTED NOT DETECTED Final   Enterobacteriaceae species DETECTED (A) NOT DETECTED Final    Comment: Enterobacteriaceae represent a large family of gram-negative bacteria, not a single organism. CRITICAL RESULT CALLED TO, READ BACK BY AND VERIFIED WITH: Starlyn Skeans PharmD 16:20 01/06/20 ([email or web address])    Enterobacter cloacae complex NOT DETECTED NOT DETECTED Final   Escherichia coli DETECTED (A) NOT DETECTED Final    Comment: CRITICAL RESULT  CALLED TO, READ BACK BY AND VERIFIED WITH: Starlyn Skeans PharmD 16:20 01/06/20 ([email or web address])    Klebsiella oxytoca NOT DETECTED NOT DETECTED Final  Klebsiella pneumoniae NOT DETECTED NOT DETECTED Final   Proteus species NOT DETECTED NOT DETECTED Final   Serratia marcescens NOT DETECTED NOT DETECTED Final   Carbapenem resistance NOT DETECTED NOT DETECTED Final   Haemophilus influenzae NOT DETECTED NOT DETECTED Final   Neisseria meningitidis NOT DETECTED NOT DETECTED Final   Pseudomonas aeruginosa NOT DETECTED NOT DETECTED Final   Candida albicans NOT DETECTED NOT DETECTED Final   Candida glabrata NOT DETECTED NOT DETECTED Final   Candida krusei NOT DETECTED NOT DETECTED Final   Candida parapsilosis NOT DETECTED NOT DETECTED Final   Candida tropicalis NOT DETECTED NOT DETECTED Final    Comment: Performed at [facility], [place] [address], [place], [place] [address]  SARS CORONAVIRUS 2 (TAT 6-24 HRS) Nasopharyngeal Nasopharyngeal Swab     Status: None   Collection Time: 01/06/20  1:57 AM   Specimen: Nasopharyngeal Swab  Result Value Ref Range Status   SARS Coronavirus 2 NEGATIVE NEGATIVE Final    Comment: (NOTE) SARS-CoV-2 target nucleic acids are NOT DETECTED. The SARS-CoV-2 RNA is generally detectable in upper and lower respiratory specimens during the acute phase of infection. Negative results do not preclude SARS-CoV-2 infection, do not rule out co-infections with other pathogens, and should not be used as the sole basis for treatment or other patient management decisions. Negative results must be combined with clinical observations, patient history, and epidemiological information. The expected result is Negative. Fact Sheet for Patients: SugarRoll.be Fact Sheet for Healthcare Providers: https://www.woods-mathews.com/ This test is not yet approved or cleared by the [place] FDA and  has been authorized for detection and/or diagnosis of  SARS-CoV-2 by FDA under an Emergency Use Authorization (EUA). This EUA will remain  in effect (meaning this test can be used) for the duration of the COVID-19 declaration under Section 56 4(b)(1) of the Act, 21 U.S.C. section 360bbb-3(b)(1), unless the authorization is terminated or revoked sooner. Performed at [facility], [place] [address] [place], [place] [address]   Blood Culture (routine x 2)     Status: Abnormal   Collection Time: 01/06/20  2:42 AM   Specimen: BLOOD  Result Value Ref Range Status   Specimen Description BLOOD LEFT ARM  Final   Special Requests   Final    BOTTLES DRAWN AEROBIC AND ANAEROBIC Blood Culture adequate volume   Culture  Setup Time   Final    GRAM NEGATIVE RODS IN BOTH AEROBIC AND ANAEROBIC BOTTLES CRITICAL VALUE NOTED.  VALUE IS CONSISTENT WITH PREVIOUSLY REPORTED AND CALLED VALUE.    Culture (A)  Final    ESCHERICHIA COLI SUSCEPTIBILITIES PERFORMED ON PREVIOUS CULTURE WITHIN THE LAST 5 DAYS. Performed at [facility], [place] [address] [place], [place] [address]    Report Status 01/08/2020 FINAL  Final  Urine culture     Status: Abnormal   Collection Time: 01/06/20  4:04 PM   Specimen: In/Out Cath Urine  Result Value Ref Range Status   Specimen Description IN/OUT CATH URINE  Final   Special Requests   Final    NONE Performed at [facility], [place] [address], [place], [place] [address]    Culture 5,000 COLONIES/mL ESCHERICHIA COLI (A)  Final   Report Status 01/08/2020 FINAL  Final   Organism ID, Bacteria ESCHERICHIA COLI (A)  Final      Susceptibility   Escherichia coli - MIC*    AMPICILLIN <=2 SENSITIVE Sensitive     CEFAZOLIN <=4 SENSITIVE Sensitive  CEFTRIAXONE <=0.25 SENSITIVE Sensitive     CIPROFLOXACIN <=0.25 SENSITIVE Sensitive     GENTAMICIN <=1 SENSITIVE Sensitive     IMIPENEM <=0.25 SENSITIVE Sensitive     NITROFURANTOIN <=16 SENSITIVE Sensitive     TRIMETH/SULFA <=20 SENSITIVE Sensitive      AMPICILLIN/SULBACTAM <=2 SENSITIVE Sensitive     PIP/TAZO <=4 SENSITIVE Sensitive     * 5,000 COLONIES/mL ESCHERICHIA COLI      Studies: No results found.  Scheduled Meds: . allopurinol  100 mg Oral Daily  . bisacodyl  10 mg Rectal Daily  . carvedilol  12.5 mg Oral BID  . cephALEXin  500 mg Oral Q8H  . cholecalciferol  2,000 Units Oral Daily  . cinacalcet  30 mg Oral Q breakfast  . clopidogrel  75 mg Oral Daily  . darbepoetin (ARANESP) injection - NON-DIALYSIS  60 mcg Subcutaneous Once  . fenofibrate  160 mg Oral Daily  . furosemide  80 mg Intravenous Q12H  . heparin  5,000 Units Subcutaneous Q8H  . hydrOXYzine  25 mg Oral BID  . insulin aspart  0-15 Units Subcutaneous TID WC  . insulin NPH Human  20 Units Subcutaneous BID AC & HS  . levothyroxine  150 mcg Oral Q0600  . magnesium oxide  400 mg Oral BID  . mirabegron ER  25 mg Oral Daily  . mometasone-formoterol  2 puff Inhalation BID  . multivitamin with minerals  1 tablet Oral Daily  . pantoprazole  40 mg Oral Daily  . polyethylene glycol  17 g Oral Daily  . simvastatin  20 mg Oral QHS  . sodium zirconium cyclosilicate  10 g Oral Daily  . tiZANidine  4 mg Oral BID    Continuous Infusions: . ferric gluconate (FERRLECIT/NULECIT) IV 250 mg (01/12/20 1054)     LOS: 5 days   Total time spent 29 minutes  Darliss Cheney, MD [facility]   01/12/2020, 11:01 AM

## 2020-01-12 NOTE — Progress Notes (Addendum)
[facility] NEPHROLOGY PROGRESS NOTE  Assessment/ Plan: Pt is a 69 y.o. yo female history of CKD stage IV with baseline creatinine level between 3-4, hypertension, DM, CHF presented with sepsis due to E. coli/UTI.  #Acute kidney injury on CKD stage IV likely due to sepsis and the hemodynamic changes with the use of diuretics.  UA with UTI.  CT scan with no obstruction.   Held evening dose of Lasix yesterday.  Patient is having worsening shortness of breath and lower extremity edema.  Lab with elevated BUN and creatinine level.  Resuming Lasix 80 mg IV twice daily today.    Although patient is nonoliguric she started developing early sign of uremia and now has hyperkalemia.  I suggested her to start dialysis.  Discussed extensively about placement of access, fistula and starting HD.  Patient has not decided about dialysis yet.  No urgent indication for HD today however, I think she will need dialysis during this hospitalization.  Vein mapping was already done.  #Sepsis due to UTI/E. coli bacteremia: Now antibiotics changed to Keflex.  Per primary team.  #Hypertension: Monitor BP.  On Coreg.  #Anemia of CKD: Iron saturation only 4%.  Currently on IV iron.  Seems like she recovered from sepsis.  Ordered Aranesp on 4/6.  #Secondary hyperparathyroidism: PTH level 79 on 12/04/2019.  Phosphorus level at goal.   Continue Sensipar.  #Hyperkalemia: Discontinued KCl on 4/5.  She was also taking salt substitute which contains potassium.  I advised her to discontinue taking salt substitute.  Receiving Lokelma.  IV Lasix would help.  Monitor lab.  Subjective: Seen and examined at bedside.  Feeling weak, tired.  Denies nausea vomiting chest pain shortness breath.  Patient was dozing off intermittently during conversation. Objective Vital signs in last 24 hours: Vitals:   01/11/20 1344 01/11/20 2002 01/11/20 2052 01/12/20 0558  BP: 124/83  128/80 121/78  Pulse: 77  75 81  Resp: 20  18 18    Temp: 98.4 F (36.9 C)  98.6 F (37 C) 98.6 F (37 C)  TempSrc: Oral  Oral Oral  SpO2: 94% 94% 95% 95%  Weight:      Height:       Weight change:   Intake/Output Summary (Last 24 hours) at 01/12/2020 0914 Last data filed at 01/12/2020 0755 Gross per 24 hour  Intake 240 ml  Output 2130 ml  Net -1890 ml       Labs: Basic Metabolic Panel: Recent Labs  Lab 01/08/20 0347 01/09/20 0451 01/10/20 0609 01/11/20 1010 01/12/20 0238  NA 133*   < > 140 139 136  K 4.7   < > 4.5 5.9* 6.5*  CL 87*   < > 94* 94* 95*  CO2 31   < > 29 33* 30  GLUCOSE 196*   < > 77 170* 300*  BUN 130*   < > 127* 128* 129*  CREATININE 4.95*   < > 3.84* 3.95* 3.98*  CALCIUM 8.5*   < > 8.6* 9.1 8.7*  PHOS 5.9*  --   --   --  2.8   < > = values in this interval not displayed.   Liver Function Tests: Recent Labs  Lab 01/08/20 0347 01/08/20 0347 01/09/20 0451 01/10/20 0609 01/12/20 0238  AST 28  --  20 23  --   ALT 19  --  17 17  --   ALKPHOS 54  --  51 51  --   BILITOT 0.6  --  0.7 0.4  --  PROT 5.8*  --  5.8* 6.3*  --   ALBUMIN 2.3*   < > 2.2* 2.4* 2.3*   < > = values in this interval not displayed.   Recent Labs  Lab 01/05/20 1901  LIPASE 25   No results for input(s): AMMONIA in the last 168 hours. CBC: Recent Labs  Lab 01/08/20 0347 01/08/20 1355 01/09/20 0451 01/09/20 0451 01/10/20 0609 01/11/20 1010 01/12/20 0238  WBC 10.5  --  8.2   < > 7.0 8.7 9.9  HGB 7.6*   < > 7.6*   < > 7.7* 7.9* 7.7*  HCT 24.4*   < > 24.1*   < > 25.5* 26.1* 25.3*  MCV 89.1  --  88.9  --  89.8 89.4 90.7  PLT 246  --  261   < > 290 358 393   < > = values in this interval not displayed.   Cardiac Enzymes: Recent Labs  Lab 01/08/20 0347  CKTOTAL 81   CBG: Recent Labs  Lab 01/11/20 0739 01/11/20 1207 01/11/20 1647 01/11/20 2051 01/12/20 0741  GLUCAP 122* 190* 149* 184* 165*    Iron Studies:  No results for input(s): IRON, TIBC, TRANSFERRIN, FERRITIN in the last 72  hours. Studies/Results: No results found.  Medications: Infusions: . ferric gluconate (FERRLECIT/NULECIT) IV 250 mg (01/11/20 1057)    Scheduled Medications: . allopurinol  100 mg Oral Daily  . bisacodyl  10 mg Rectal Daily  . carvedilol  12.5 mg Oral BID  . cephALEXin  500 mg Oral Q8H  . cholecalciferol  2,000 Units Oral Daily  . cinacalcet  30 mg Oral Q breakfast  . clopidogrel  75 mg Oral Daily  . fenofibrate  160 mg Oral Daily  . furosemide  80 mg Intravenous Q12H  . heparin  5,000 Units Subcutaneous Q8H  . hydrOXYzine  25 mg Oral BID  . insulin aspart  0-15 Units Subcutaneous TID WC  . insulin NPH Human  20 Units Subcutaneous BID AC & HS  . levothyroxine  150 mcg Oral Q0600  . magnesium oxide  400 mg Oral BID  . mirabegron ER  25 mg Oral Daily  . mometasone-formoterol  2 puff Inhalation BID  . multivitamin with minerals  1 tablet Oral Daily  . pantoprazole  40 mg Oral Daily  . polyethylene glycol  17 g Oral Daily  . simvastatin  20 mg Oral QHS  . sodium zirconium cyclosilicate  10 g Oral Daily  . tiZANidine  4 mg Oral BID    have reviewed scheduled and prn medications.  Physical Exam: General:NAD, comfortable, sitting on chair and intermittently dozed off during conversation. Heart:RRR, s1s2 nl, no rubs Lungs:clear b/l, no crackle Abdomen:soft, Non-tender, non-distended Extremities: Lower extremity not pitting edema + Neurology: Alert awake and following commands.  Zahir Eisenhour Tanna Furry 01/12/2020,9:14 AM  LOS: 5 days  Pager: [phone]

## 2020-01-13 LAB — RENAL FUNCTION PANEL
Albumin: 2.6 g/dL — ABNORMAL LOW (ref 3.5–5.0)
Anion gap: 14 (ref 5–15)
BUN: 121 mg/dL — ABNORMAL HIGH (ref 8–23)
CO2: 31 mmol/L (ref 22–32)
Calcium: 9.3 mg/dL (ref 8.9–10.3)
Chloride: 95 mmol/L — ABNORMAL LOW (ref 98–111)
Creatinine, Ser: 3.51 mg/dL — ABNORMAL HIGH (ref 0.44–1.00)
GFR calc Af Amer: 15 mL/min — ABNORMAL LOW (ref >60)
GFR calc non Af Amer: 13 mL/min — ABNORMAL LOW (ref >60)
Glucose, Bld: 160 mg/dL — ABNORMAL HIGH (ref 70–99)
Phosphorus: 3.4 mg/dL (ref 2.5–4.6)
Potassium: 5.3 mmol/L — ABNORMAL HIGH (ref 3.5–5.1)
Sodium: 140 mmol/L (ref 135–145)

## 2020-01-13 LAB — GLUCOSE, CAPILLARY
Glucose-Capillary: 146 mg/dL — ABNORMAL HIGH (ref 70–99)
Glucose-Capillary: 183 mg/dL — ABNORMAL HIGH (ref 70–99)
Glucose-Capillary: 223 mg/dL — ABNORMAL HIGH (ref 70–99)
Glucose-Capillary: 312 mg/dL — ABNORMAL HIGH (ref 70–99)

## 2020-01-13 LAB — CBC
HCT: 26.3 % — ABNORMAL LOW (ref 36.0–46.0)
Hemoglobin: 7.9 g/dL — ABNORMAL LOW (ref 12.0–15.0)
MCH: 26.7 pg (ref 26.0–34.0)
MCHC: 30 g/dL (ref 30.0–36.0)
MCV: 88.9 fL (ref 80.0–100.0)
Platelets: 458 10*3/uL — ABNORMAL HIGH (ref 150–400)
RBC: 2.96 MIL/uL — ABNORMAL LOW (ref 3.87–5.11)
RDW: 17 % — ABNORMAL HIGH (ref 11.5–15.5)
WBC: 10.1 10*3/uL (ref 4.0–10.5)
nRBC: 0.2 % (ref 0.0–0.2)

## 2020-01-13 MED ORDER — POLYETHYLENE GLYCOL 3350 17 G PO PACK: 17.0000 g | PACK | Freq: Every day | ORAL | Status: DC | PRN

## 2020-01-13 MED ORDER — FUROSEMIDE 80 MG PO TABS
80.0000 mg | ORAL_TABLET | Freq: Two times a day (BID) | ORAL | Status: DC
  Administered 2020-01-14: 80 mg via ORAL
  Filled 2020-01-13: qty 1

## 2020-01-13 MED ORDER — DOCUSATE SODIUM 100 MG PO CAPS
100.0000 mg | ORAL_CAPSULE | Freq: Every day | ORAL | Status: DC
  Administered 2020-01-14: 100 mg via ORAL
  Filled 2020-01-13: qty 1

## 2020-01-13 NOTE — Progress Notes (Signed)
Inpatient Diabetes Program Recommendations  AACE/ADA: New Consensus Statement on Inpatient Glycemic Control (2015)  Target Ranges:  Prepandial:   less than 140 mg/dL      Peak postprandial:   less than 180 mg/dL (1-2 hours)      Critically ill patients:  140 - 180 mg/dL   Lab Results  Component Value Date   GLUCAP 146 (H) 01/13/2020   HGBA1C 6.5 (H) 01/06/2020    Review of Glycemic Control Results for [name] [name], [name] [name] (MRN [id number]) as of 01/13/2020 11:21  Ref. Range 01/12/2020 07:41 01/12/2020 11:58 01/12/2020 16:00 01/12/2020 19:58 01/13/2020 07:58  Glucose-Capillary Latest Ref Range: 70 - 99 mg/dL 165 (H) 185 (H) 198 (H) 260 (H) 146 (H)   Diabetes history: Type 2 DM Outpatient Diabetes medications: Humalog 75/25 40 units QAM, 35 units QPM, Trulicity 0.93 mg Q monday Current orders for Inpatient glycemic control: NPH 20 units BID, Novolog 0-15 units TID  Inpatient Diabetes Program Recommendations:    Consider increasing NPH in AM to 25 units.   Thanks, Bronson Curb, MSN, RNC-OB Diabetes Coordinator [phone] (8a-5p)

## 2020-01-13 NOTE — Progress Notes (Signed)
PROGRESS NOTE  [name] [name] XFG:[id number] DOB: 1950-11-07 DOA: 01/05/2020 PCP: [name], MD  Brief Hospital course: 69 year old female with past medical history of morbid obesity, diabetes mellitus, chronic diastolic heart failure and stage IV/V chronic kidney disease admitted on early morning of 3/31 for UTI with secondary sepsis.  Following admission, patient's blood cultures grew out E. coli as did her urine.  Patient given IV fluids and antibiotics.  Over the next few days, patient's white count has dramatically improved going from 24 on admission down to normal.  Unfortunately, her renal function has continued to decline going from 3.3 baseline to 3.9 on admission and has continued to climb and is at 4.95 and now has plateaued around 3.9 since last 3 days. Nephrology consulted and it is suspected that likely with her sepsis, her already very weak kidneys have continued to receive damage and she will likely need dialysis started during this hospitalization.  Assessment/Plan:  Sepsis secondary to UTI (urinary tract infection)/gram-negative/E. coli bacteremia: Patient met criteria for sepsis on admission given leukocytosis, tachycardia and tachypnea with urinary source.   Urine and blood cultures grew out E. coli, near pansensitive.  Sepsis itself stabilized.  Was on Rocephin.  Antibiotics switched to Keflex.  Course completed.  Tobacco abuse: Counseled to quit, declined nicotine patch  Acute kidney injury in the setting of CKD (chronic kidney disease), stage IV/V ([place]): Followed by nephrology.  Given some IV fluids, her renal function proved but she developed dyspnea.  Fluids were stopped and she was started on Lasix.  Renal function has plateaued since last 3 days.  Was on Lasix for last 3 days but evening dose on 01/11/2020 was held.  Now feels some dyspnea.  Still with bibasilar crackles.  Now also has hyperkalemia and early signs of uremia.  Nephrology has recommended her dialysis  however she has not made a final decision yet.  Her Lasix has been resumed.  Hyperkalemia: Lasix resumed.  She is receiving Lokelma ordered by nephrology.   Morbid obesity [facility]): Patient criteria BMI greater than 40  Essential HTN (hypertension): Very well controlled.  Continue home dose of carvedilol.  DM (diabetes mellitus), type 2 with renal complications [facility]): Blood sugar fairly stable now.  Continue current NPH.  Continue to hold premeal that she was on and became hypoglycemic.  Continue SSI.    Acute on chronic systolic CHF (congestive heart failure) ([place]) causing acute respiratory failure with hypoxia: Last echo in June 2020 shows ejection fraction of 40 to 45%.  She is on diuretics now.  Still has basal crackles.  Oxygen requirement improving.  Currently on 2 L nasal correction.  Encourage incentive spirometry.  Anemia of chronic disease: Baseline hemoglobin between 10 and 11.  Currently 7.7.  No indication of transfusion.  Monitor.  Code Status: Full code  Family Communication: No family at bedside.  Patient from: Home  Disposition: To be determined based on clinical course.  PT evaluation pending.  Barriers to discharge: monitor renal function and patient may need dialysis during this hospitalization.    Consultants:  Nephrology  Procedures:  None  Antimicrobials:  IV Rocephin 3/31-4/2  Keflex 4/2-present  DVT prophylaxis: SCDs  Subjective: Continues to report generalized weakness.  No nausea no vomiting no fever no chills.  Objective: Vitals:   01/13/20 0407 01/13/20 1327  BP: 130/65 117/60  Pulse: 72 83  Resp:  20  Temp: 97.7 F (36.5 C) 97.9 F (36.6 C)  SpO2: 98% 97%    Intake/Output  Summary (Last 24 hours) at 01/13/2020 1914 Last data filed at 01/13/2020 0900 Gross per 24 hour  Intake 360 ml  Output 1500 ml  Net -1140 ml   Filed Weights   01/05/20 1828 01/06/20 0158  Weight: 113.4 kg 113.4 kg   Body mass index is 44.29  kg/m.  Exam:  General exam: Appears calm and comfortable but weak Respiratory system: Bibasilar crackles.  No wheezes.Marland Kitchen Respiratory effort normal. Cardiovascular system: S1 & S2 heard, RRR. No JVD, murmurs, rubs, gallops or clicks.  +2 pitting edema bilateral lower extremity Gastrointestinal system: Abdomen is nondistended, soft and nontender. No organomegaly or masses felt. Normal bowel sounds heard. Central nervous system: Alert and oriented. No focal neurological deficits. Extremities: Symmetric 5 x 5 power. Skin: No rashes, lesions or ulcers.  Psychiatry: Judgement and insight appear poor.  Mood & affect flat.  Data Reviewed: CBC: Recent Labs  Lab 01/09/20 0451 01/10/20 0609 01/11/20 1010 01/12/20 0238 01/13/20 0720  WBC 8.2 7.0 8.7 9.9 10.1  HGB 7.6* 7.7* 7.9* 7.7* 7.9*  HCT 24.1* 25.5* 26.1* 25.3* 26.3*  MCV 88.9 89.8 89.4 90.7 88.9  PLT 261 290 358 393 956*   Basic Metabolic Panel: Recent Labs  Lab 01/07/20 0327 01/07/20 0327 01/08/20 0347 01/09/20 0451 01/10/20 0609 01/10/20 0609 01/11/20 1010 01/12/20 0238 01/12/20 1156 01/12/20 1658 01/13/20 0720  NA 131*   < > 133*   < > 140  --  139 136  --  139 140  K 4.1   < > 4.7   < > 4.5   < > 5.9* 6.5* 6.0* 6.0* 5.3*  CL 86*   < > 87*   < > 94*  --  94* 95*  --  96* 95*  CO2 30   < > 31   < > 29  --  33* 30  --  32 31  GLUCOSE 240*   < > 196*   < > 77  --  170* 300*  --  211* 160*  BUN 118*   < > 130*   < > 127*  --  128* 129*  --  124* 121*  CREATININE 4.44*   < > 4.95*   < > 3.84*  --  3.95* 3.98*  --  4.28* 3.51*  CALCIUM 8.7*   < > 8.5*   < > 8.6*  --  9.1 8.7*  --  9.2 9.3  MG 1.6*  --  1.8  --   --   --  2.4  --   --   --   --   PHOS  --   --  5.9*  --   --   --   --  2.8  --  3.1 3.4   < > = values in this interval not displayed.   GFR: Estimated Creatinine Clearance: 18.3 mL/min (A) (by C-G formula based on SCr of 3.51 mg/dL (H)). Liver Function Tests: Recent Labs  Lab 01/08/20 0347 01/08/20 0347  01/09/20 0451 01/10/20 0609 01/12/20 0238 01/12/20 1658 01/13/20 0720  AST 28  --  20 23  --   --   --   ALT 19  --  17 17  --   --   --   ALKPHOS 54  --  51 51  --   --   --   BILITOT 0.6  --  0.7 0.4  --   --   --   PROT 5.8*  --  5.8* 6.3*  --   --   --  ALBUMIN 2.3*   < > 2.2* 2.4* 2.3* 2.6* 2.6*   < > = values in this interval not displayed.   No results for input(s): LIPASE, AMYLASE in the last 168 hours. No results for input(s): AMMONIA in the last 168 hours. Coagulation Profile: No results for input(s): INR, PROTIME in the last 168 hours. Cardiac Enzymes: Recent Labs  Lab 01/08/20 0347  CKTOTAL 81   BNP (last 3 results) No results for input(s): PROBNP in the last 8760 hours. HbA1C: No results for input(s): HGBA1C in the last 72 hours. CBG: Recent Labs  Lab 01/12/20 1600 01/12/20 1958 01/13/20 0758 01/13/20 1201 01/13/20 1657  GLUCAP 198* 260* 146* 183* 223*   Lipid Profile: No results for input(s): CHOL, HDL, LDLCALC, TRIG, CHOLHDL, LDLDIRECT in the last 72 hours. Thyroid Function Tests: No results for input(s): TSH, T4TOTAL, FREET4, T3FREE, THYROIDAB in the last 72 hours. Anemia Panel: No results for input(s): VITAMINB12, FOLATE, FERRITIN, TIBC, IRON, RETICCTPCT in the last 72 hours. Urine analysis:    Component Value Date/Time   COLORURINE AMBER (A) 01/05/2020 2110   APPEARANCEUR CLOUDY (A) 01/05/2020 2110   LABSPEC 1.013 01/05/2020 2110   PHURINE 5.0 01/05/2020 2110   GLUCOSEU NEGATIVE 01/05/2020 2110   HGBUR LARGE (A) 01/05/2020 2110   BILIRUBINUR NEGATIVE 01/05/2020 2110   KETONESUR NEGATIVE 01/05/2020 2110   PROTEINUR 100 (A) 01/05/2020 2110   UROBILINOGEN 0.2 08/04/2014 1409   NITRITE NEGATIVE 01/05/2020 2110   LEUKOCYTESUR MODERATE (A) 01/05/2020 2110   Sepsis Labs: '@LABRCNTIP' (procalcitonin:4,lacticidven:4)  ) Recent Results (from the past 240 hour(s))  Blood Culture (routine x 2)     Status: Abnormal   Collection Time: 01/06/20  1:53  AM   Specimen: BLOOD  Result Value Ref Range Status   Specimen Description BLOOD RIGHT ARM  Final   Special Requests   Final    BOTTLES DRAWN AEROBIC AND ANAEROBIC Blood Culture adequate volume   Culture  Setup Time   Final    GRAM NEGATIVE RODS IN BOTH AEROBIC AND ANAEROBIC BOTTLES CRITICAL RESULT CALLED TO, READ BACK BY AND VERIFIED WITH: Starlyn Skeans PharmD 16:20 01/06/20 ([email or web address]) Performed at [facility], [place] [address] [place], [place] [address]    Culture ESCHERICHIA COLI (A)  Final   Report Status 01/08/2020 FINAL  Final   Organism ID, Bacteria ESCHERICHIA COLI  Final      Susceptibility   Escherichia coli - MIC*    AMPICILLIN <=2 SENSITIVE Sensitive     CEFAZOLIN <=4 SENSITIVE Sensitive     CEFEPIME <=0.12 SENSITIVE Sensitive     CEFTAZIDIME <=1 SENSITIVE Sensitive     CEFTRIAXONE <=0.25 SENSITIVE Sensitive     CIPROFLOXACIN <=0.25 SENSITIVE Sensitive     GENTAMICIN <=1 SENSITIVE Sensitive     IMIPENEM <=0.25 SENSITIVE Sensitive     TRIMETH/SULFA <=20 SENSITIVE Sensitive     AMPICILLIN/SULBACTAM <=2 SENSITIVE Sensitive     PIP/TAZO <=4 SENSITIVE Sensitive     * ESCHERICHIA COLI  Blood Culture ID Panel (Reflexed)     Status: Abnormal   Collection Time: 01/06/20  1:53 AM  Result Value Ref Range Status   Enterococcus species NOT DETECTED NOT DETECTED Final   Listeria monocytogenes NOT DETECTED NOT DETECTED Final   Staphylococcus species NOT DETECTED NOT DETECTED Final   Staphylococcus aureus (BCID) NOT DETECTED NOT DETECTED Final   Streptococcus species NOT DETECTED NOT DETECTED Final   Streptococcus agalactiae NOT DETECTED NOT DETECTED Final   Streptococcus pneumoniae NOT DETECTED NOT DETECTED  Final   Streptococcus pyogenes NOT DETECTED NOT DETECTED Final   Acinetobacter baumannii NOT DETECTED NOT DETECTED Final   Enterobacteriaceae species DETECTED (A) NOT DETECTED Final    Comment: Enterobacteriaceae represent a large family of gram-negative bacteria, not a single  organism. CRITICAL RESULT CALLED TO, READ BACK BY AND VERIFIED WITH: Starlyn Skeans PharmD 16:20 01/06/20 ([email or web address])    Enterobacter cloacae complex NOT DETECTED NOT DETECTED Final   Escherichia coli DETECTED (A) NOT DETECTED Final    Comment: CRITICAL RESULT CALLED TO, READ BACK BY AND VERIFIED WITH: Starlyn Skeans PharmD 16:20 01/06/20 ([email or web address])    Klebsiella oxytoca NOT DETECTED NOT DETECTED Final   Klebsiella pneumoniae NOT DETECTED NOT DETECTED Final   Proteus species NOT DETECTED NOT DETECTED Final   Serratia marcescens NOT DETECTED NOT DETECTED Final   Carbapenem resistance NOT DETECTED NOT DETECTED Final   Haemophilus influenzae NOT DETECTED NOT DETECTED Final   Neisseria meningitidis NOT DETECTED NOT DETECTED Final   Pseudomonas aeruginosa NOT DETECTED NOT DETECTED Final   Candida albicans NOT DETECTED NOT DETECTED Final   Candida glabrata NOT DETECTED NOT DETECTED Final   Candida krusei NOT DETECTED NOT DETECTED Final   Candida parapsilosis NOT DETECTED NOT DETECTED Final   Candida tropicalis NOT DETECTED NOT DETECTED Final    Comment: Performed at [facility], [place] [address] [place], [place] [address]  SARS CORONAVIRUS 2 (TAT 6-24 HRS) Nasopharyngeal Nasopharyngeal Swab     Status: None   Collection Time: 01/06/20  1:57 AM   Specimen: Nasopharyngeal Swab  Result Value Ref Range Status   SARS Coronavirus 2 NEGATIVE NEGATIVE Final    Comment: (NOTE) SARS-CoV-2 target nucleic acids are NOT DETECTED. The SARS-CoV-2 RNA is generally detectable in upper and lower respiratory specimens during the acute phase of infection. Negative results do not preclude SARS-CoV-2 infection, do not rule out co-infections with other pathogens, and should not be used as the sole basis for treatment or other patient management decisions. Negative results must be combined with clinical observations, patient history, and epidemiological information. The expected result is Negative. Fact Sheet for  Patients: SugarRoll.be Fact Sheet for Healthcare Providers: https://www.woods-mathews.com/ This test is not yet approved or cleared by the [place] FDA and  has been authorized for detection and/or diagnosis of SARS-CoV-2 by FDA under an Emergency Use Authorization (EUA). This EUA will remain  in effect (meaning this test can be used) for the duration of the COVID-19 declaration under Section 56 4(b)(1) of the Act, 21 U.S.C. section 360bbb-3(b)(1), unless the authorization is terminated or revoked sooner. Performed at [facility], [place] [address], [place], [place] [address]   Blood Culture (routine x 2)     Status: Abnormal   Collection Time: 01/06/20  2:42 AM   Specimen: BLOOD  Result Value Ref Range Status   Specimen Description BLOOD LEFT ARM  Final   Special Requests   Final    BOTTLES DRAWN AEROBIC AND ANAEROBIC Blood Culture adequate volume   Culture  Setup Time   Final    GRAM NEGATIVE RODS IN BOTH AEROBIC AND ANAEROBIC BOTTLES CRITICAL VALUE NOTED.  VALUE IS CONSISTENT WITH PREVIOUSLY REPORTED AND CALLED VALUE.    Culture (A)  Final    ESCHERICHIA COLI SUSCEPTIBILITIES PERFORMED ON PREVIOUS CULTURE WITHIN THE LAST 5 DAYS. Performed at [facility], [place] [address], [place], [place] [address]    Report Status 01/08/2020 FINAL  Final  Urine culture     Status: Abnormal  Collection Time: 01/06/20  4:04 PM   Specimen: In/Out Cath Urine  Result Value Ref Range Status   Specimen Description IN/OUT CATH URINE  Final   Special Requests   Final    NONE Performed at [facility], [address], [place], [place] [address]    Culture 5,000 COLONIES/mL ESCHERICHIA COLI (A)  Final   Report Status 01/08/2020 FINAL  Final   Organism ID, Bacteria ESCHERICHIA COLI (A)  Final      Susceptibility   Escherichia coli - MIC*    AMPICILLIN <=2 SENSITIVE Sensitive     CEFAZOLIN <=4 SENSITIVE Sensitive     CEFTRIAXONE  <=0.25 SENSITIVE Sensitive     CIPROFLOXACIN <=0.25 SENSITIVE Sensitive     GENTAMICIN <=1 SENSITIVE Sensitive     IMIPENEM <=0.25 SENSITIVE Sensitive     NITROFURANTOIN <=16 SENSITIVE Sensitive     TRIMETH/SULFA <=20 SENSITIVE Sensitive     AMPICILLIN/SULBACTAM <=2 SENSITIVE Sensitive     PIP/TAZO <=4 SENSITIVE Sensitive     * 5,000 COLONIES/mL ESCHERICHIA COLI      Studies: No results found.  Scheduled Meds:  allopurinol  100 mg Oral Daily   bisacodyl  10 mg Rectal Daily   carvedilol  12.5 mg Oral BID   cholecalciferol  2,000 Units Oral Daily   cinacalcet  30 mg Oral Q breakfast   clopidogrel  75 mg Oral Daily   [START ON 01/14/2020] docusate sodium  100 mg Oral Daily   fenofibrate  160 mg Oral Daily   [START ON 01/14/2020] furosemide  80 mg Oral BID   heparin  5,000 Units Subcutaneous Q8H   hydrOXYzine  25 mg Oral BID   insulin aspart  0-15 Units Subcutaneous TID WC   insulin NPH Human  20 Units Subcutaneous BID AC & HS   levothyroxine  150 mcg Oral Q0600   magnesium oxide  400 mg Oral BID   mirabegron ER  25 mg Oral Daily   mometasone-formoterol  2 puff Inhalation BID   multivitamin with minerals  1 tablet Oral Daily   pantoprazole  40 mg Oral Daily   simvastatin  20 mg Oral QHS   sodium zirconium cyclosilicate  10 g Oral Daily   tiZANidine  4 mg Oral BID    Continuous Infusions:    LOS: 6 days   Total time spent 29 minutes  Berle Mull, MD [facility]   01/13/2020, 7:14 PM

## 2020-01-13 NOTE — Progress Notes (Signed)
[facility] NEPHROLOGY PROGRESS NOTE  Assessment/ Plan: Pt is a 69 y.o. yo female history of CKD stage IV with baseline creatinine level between 3-4, hypertension, DM, CHF presented with sepsis due to E. coli/UTI.  #Acute kidney injury on CKD stage IV likely due to sepsis and the hemodynamic changes with the use of diuretics.  UA with UTI.  CT scan with no obstruction.   Responding with IV diuretics.  Nonoliguric.  Level trending down today however BUN is still remains high.  She does not have any uremic symptoms.  No urgent need for HD and patient is hesitant to start dialysis.  Switch to oral Lasix 80 mg twice a day.  Repeat renal panel in the morning and if creatinine remains stable then she can probably be discharged with outpatient follow-up with Dr. [name].    #Sepsis due to UTI/E. coli bacteremia: Now antibiotics changed to Keflex.  Per primary team.  #Hypertension: Monitor BP.  On Coreg.  #Anemia of CKD: Iron saturation only 4%.  Currently on IV iron.  Seems like she recovered from sepsis.  Ordered Aranesp on 4/6.  #Secondary hyperparathyroidism: PTH level 79 on 12/04/2019.  Phosphorus level at goal.   Continue Sensipar.  #Hyperkalemia: Discontinued KCl on 4/5.  She was also taking salt substitute which contains potassium.  I advised her to discontinue taking salt substitute.  Receiving Lokelma.  IV Lasix would help.  Monitor lab.  Subjective: Seen and examined at bedside.  Urine output around 1680 cc.  Denies nausea vomiting chest pain shortness of breath.  No tremor or dysgeusia.    Objective Vital signs in last 24 hours: Vitals:   01/12/20 1938 01/12/20 1956 01/13/20 0407 01/13/20 1327  BP:  (!) 150/62 130/65 117/60  Pulse:  70 72 83  Resp:  16  20  Temp:  (!) 97.5 F (36.4 C) 97.7 F (36.5 C) 97.9 F (36.6 C)  TempSrc:  Oral Oral Oral  SpO2: 100% 94% 98% 97%  Weight:      Height:       Weight change:   Intake/Output Summary (Last 24 hours) at 01/13/2020  1357 Last data filed at 01/13/2020 0900 Gross per 24 hour  Intake 360 ml  Output 1500 ml  Net -1140 ml       Labs: Basic Metabolic Panel: Recent Labs  Lab 01/12/20 0238 01/12/20 0238 01/12/20 1156 01/12/20 1658 01/13/20 0720  NA 136  --   --  139 140  K 6.5*   < > 6.0* 6.0* 5.3*  CL 95*  --   --  96* 95*  CO2 30  --   --  32 31  GLUCOSE 300*  --   --  211* 160*  BUN 129*  --   --  124* 121*  CREATININE 3.98*  --   --  4.28* 3.51*  CALCIUM 8.7*  --   --  9.2 9.3  PHOS 2.8  --   --  3.1 3.4   < > = values in this interval not displayed.   Liver Function Tests: Recent Labs  Lab 01/08/20 0347 01/08/20 0347 01/09/20 0451 01/09/20 0451 01/10/20 0609 01/10/20 0609 01/12/20 0238 01/12/20 1658 01/13/20 0720  AST 28  --  20  --  23  --   --   --   --   ALT 19  --  17  --  17  --   --   --   --   ALKPHOS 54  --  51  --  51  --   --   --   --   BILITOT 0.6  --  0.7  --  0.4  --   --   --   --   PROT 5.8*  --  5.8*  --  6.3*  --   --   --   --   ALBUMIN 2.3*   < > 2.2*   < > 2.4*   < > 2.3* 2.6* 2.6*   < > = values in this interval not displayed.   No results for input(s): LIPASE, AMYLASE in the last 168 hours. No results for input(s): AMMONIA in the last 168 hours. CBC: Recent Labs  Lab 01/09/20 0451 01/09/20 0451 01/10/20 0609 01/10/20 0609 01/11/20 1010 01/12/20 0238 01/13/20 0720  WBC 8.2   < > 7.0   < > 8.7 9.9 10.1  HGB 7.6*   < > 7.7*   < > 7.9* 7.7* 7.9*  HCT 24.1*   < > 25.5*   < > 26.1* 25.3* 26.3*  MCV 88.9  --  89.8  --  89.4 90.7 88.9  PLT 261   < > 290   < > 358 393 458*   < > = values in this interval not displayed.   Cardiac Enzymes: Recent Labs  Lab 01/08/20 0347  CKTOTAL 81   CBG: Recent Labs  Lab 01/12/20 1158 01/12/20 1600 01/12/20 1958 01/13/20 0758 01/13/20 1201  GLUCAP 185* 198* 260* 146* 183*    Iron Studies:  No results for input(s): IRON, TIBC, TRANSFERRIN, FERRITIN in the last 72 hours. Studies/Results: No results  found.  Medications: Infusions:   Scheduled Medications: . allopurinol  100 mg Oral Daily  . bisacodyl  10 mg Rectal Daily  . carvedilol  12.5 mg Oral BID  . cholecalciferol  2,000 Units Oral Daily  . cinacalcet  30 mg Oral Q breakfast  . clopidogrel  75 mg Oral Daily  . [START ON 01/14/2020] docusate sodium  100 mg Oral Daily  . fenofibrate  160 mg Oral Daily  . [START ON 01/14/2020] furosemide  80 mg Oral BID  . heparin  5,000 Units Subcutaneous Q8H  . hydrOXYzine  25 mg Oral BID  . insulin aspart  0-15 Units Subcutaneous TID WC  . insulin NPH Human  20 Units Subcutaneous BID AC & HS  . levothyroxine  150 mcg Oral Q0600  . magnesium oxide  400 mg Oral BID  . mirabegron ER  25 mg Oral Daily  . mometasone-formoterol  2 puff Inhalation BID  . multivitamin with minerals  1 tablet Oral Daily  . pantoprazole  40 mg Oral Daily  . simvastatin  20 mg Oral QHS  . sodium zirconium cyclosilicate  10 g Oral Daily  . tiZANidine  4 mg Oral BID    have reviewed scheduled and prn medications.  Physical Exam: General:NAD, comfortable, sitting on chair and intermittently dozed off during conversation. Heart:RRR, s1s2 nl, no rubs Lungs:clear b/l, no crackle Abdomen:soft, Non-tender, non-distended Extremities: Lower extremity not pitting edema stable. Neurology: Alert awake and following commands.  [name] [name] 01/13/2020,1:57 PM  LOS: 6 days  Pager: [phone]

## 2020-01-13 NOTE — Progress Notes (Signed)
Physical Therapy Treatment Patient Details Name: [name] [name] MRN: [id number] DOB: Oct 22, 1950 Today's Date: 01/13/2020    History of Present Illness 69 y.o. 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 Pt presents to ED with 2 day h/o lower abd pain, associated N/V.    PT Comments    Patient received up in chair with RN attending, surprised to see therapist but able to be encouraged to participate by PT and RN today. Continues to have slow processing, often stating "wait wait wait", or "you're 5 steps ahead of me, its not your turn yet!". Needed extended time to participate in session today. Able to perform functional transfers with min guard and RW with good sequencing/hand placement, did need cues for appropriate distance from/safe use of RW with gait today especially when fatigued. Progressed gait distance but very fatigued. SpO2 96-97% on room air at rest, 93% after gait 28f; unable to get accurate signal with activity. Able to toilet with S but did ultimately need MaxA for pericare. Left up in recliner with all needs met, RN aware of patient status.     Follow Up Recommendations  Home health PT;Supervision/Assistance - 24 hour     Equipment Recommendations  Rolling walker with 5" wheels    Recommendations for Other Services       Precautions / Restrictions Precautions Precautions: Fall Restrictions Weight Bearing Restrictions: No    Mobility  Bed Mobility               General bed mobility comments: OOB in recliner  Transfers Overall transfer level: Needs assistance Equipment used: Rolling walker (2 wheeled) Transfers: Sit to/from Stand Sit to Stand: Min guard         General transfer comment: min guard and extended time, occasional cues for hand placement  Ambulation/Gait Ambulation/Gait assistance: Min guard Gait Distance (Feet): 60 Feet Assistive device: Rolling walker (2 wheeled) Gait Pattern/deviations:  Step-through pattern;Decreased stride length;Trunk flexed Gait velocity: reduced   General Gait Details: flexed posture and reduced stride length, slow gait speed; cues to not push RW too far ahead of herself.   Stairs             Wheelchair Mobility    Modified Rankin (Stroke Patients Only)       Balance Overall balance assessment: Needs assistance Sitting-balance support: No upper extremity supported;Feet supported Sitting balance-Leahy Scale: Good Sitting balance - Comments: close S   Standing balance support: Bilateral upper extremity supported Standing balance-Leahy Scale: Fair Standing balance comment: minG with BUE support of RW                            Cognition Arousal/Alertness: Awake/alert Behavior During Therapy: WFL for tasks assessed/performed Overall Cognitive Status: Impaired/Different from baseline Area of Impairment: Problem solving                             Problem Solving: Slow processing General Comments: ongoing slow processing and stating "you just move too fast, you're 5 steps ahead" to therapist      Exercises      General Comments General comments (skin integrity, edema, etc.): SpO2 97% on RA at rest; unable to get accurate reading with gait/activity; SpO2 93% on RA with return to room      Pertinent Vitals/Pain Pain Assessment: No/denies pain Pain Score: 0-No pain Pain Intervention(s): Limited activity  within patient's tolerance;Monitored during session    Home Living                      Prior Function            PT Goals (current goals can now be found in the care plan section) Acute Rehab PT Goals Patient Stated Goal: To improve mobility and go home PT Goal Formulation: With patient Time For Goal Achievement: 01/21/20 Potential to Achieve Goals: Good Progress towards PT goals: Progressing toward goals    Frequency    Min 3X/week      PT Plan Current plan remains appropriate     Co-evaluation              AM-PAC PT "6 Clicks" Mobility   Outcome Measure  Help needed turning from your back to your side while in a flat bed without using bedrails?: A Little Help needed moving from lying on your back to sitting on the side of a flat bed without using bedrails?: A Little Help needed moving to and from a bed to a chair (including a wheelchair)?: A Little Help needed standing up from a chair using your arms (e.g., wheelchair or bedside chair)?: A Little Help needed to walk in hospital room?: A Little Help needed climbing 3-5 steps with a railing? : A Lot 6 Click Score: 17    End of Session Equipment Utilized During Treatment: Gait belt Activity Tolerance: Patient tolerated treatment well;Patient limited by fatigue Patient left: in chair;with call bell/phone within reach Nurse Communication: Mobility status PT Visit Diagnosis: Muscle weakness (generalized) (M62.81)     Time: 1020-1105 PT Time Calculation (min) (ACUTE ONLY): 45 min  Charges:  $Gait Training: 8-22 mins $Therapeutic Activity: 23-37 mins                     Windell Norfolk, DPT, PN1   Supplemental Physical Therapist [place]    Pager [phone] Acute Rehab Office [phone]

## 2020-01-14 LAB — RENAL FUNCTION PANEL
Albumin: 2.6 g/dL — ABNORMAL LOW (ref 3.5–5.0)
Anion gap: 13 (ref 5–15)
BUN: 119 mg/dL — ABNORMAL HIGH (ref 8–23)
CO2: 32 mmol/L (ref 22–32)
Calcium: 9.1 mg/dL (ref 8.9–10.3)
Chloride: 96 mmol/L — ABNORMAL LOW (ref 98–111)
Creatinine, Ser: 3.7 mg/dL — ABNORMAL HIGH (ref 0.44–1.00)
GFR calc Af Amer: 14 mL/min — ABNORMAL LOW (ref >60)
GFR calc non Af Amer: 12 mL/min — ABNORMAL LOW (ref >60)
Glucose, Bld: 158 mg/dL — ABNORMAL HIGH (ref 70–99)
Phosphorus: 3.9 mg/dL (ref 2.5–4.6)
Potassium: 5.1 mmol/L (ref 3.5–5.1)
Sodium: 141 mmol/L (ref 135–145)

## 2020-01-14 LAB — GLUCOSE, CAPILLARY
Glucose-Capillary: 141 mg/dL — ABNORMAL HIGH (ref 70–99)
Glucose-Capillary: 222 mg/dL — ABNORMAL HIGH (ref 70–99)

## 2020-01-14 MED ORDER — MAGNESIUM OXIDE 400 (241.3 MG) MG PO TABS: 400.0000 mg | ORAL_TABLET | Freq: Two times a day (BID) | ORAL | 0 refills | Status: DC

## 2020-01-14 MED ORDER — FUROSEMIDE 80 MG PO TABS: 80.0000 mg | ORAL_TABLET | Freq: Two times a day (BID) | ORAL | 0 refills | Status: DC

## 2020-01-14 MED ORDER — SODIUM ZIRCONIUM CYCLOSILICATE 10 G PO PACK: 10.0000 g | PACK | Freq: Every day | ORAL | 0 refills | Status: DC

## 2020-01-14 MED FILL — FUROSEMIDE 80 MG TAB: 80 | 30 days supply | Qty: 60 | Fill #0

## 2020-01-14 MED FILL — MAGNESIUM OXIDE 400 MG TABS: 400 | 15 days supply | Qty: 30 | Fill #0

## 2020-01-14 MED FILL — LOKELMA 10 GM PACK: 10 | 30 days supply | Qty: 30 | Fill #0

## 2020-01-14 NOTE — Social Work (Signed)
Per RN pt did not want to wait for walker delivery.   CSW signing off. Please consult if any additional needs arise.  Alexander Mt, MSW, [place] Work

## 2020-01-14 NOTE — Progress Notes (Signed)
Inpatient Diabetes Program Recommendations  AACE/ADA: New Consensus Statement on Inpatient Glycemic Control (2015)  Target Ranges:  Prepandial:   less than 140 mg/dL      Peak postprandial:   less than 180 mg/dL (1-2 hours)      Critically ill patients:  140 - 180 mg/dL   Results for [name] [name], [name] [name] (MRN [id number]) as of 01/14/2020 10:19  Ref. Range 01/13/2020 07:58 01/13/2020 12:01 01/13/2020 16:57 01/13/2020 20:17  Glucose-Capillary Latest Ref Range: 70 - 99 mg/dL 146 (H)  2 units NOVOLOG +  20 units NPH Insulin  183 (H)  3 units NOVOLOG  223 (H)  5 units NOVOLOG  312 (H)     20 units NPH Insulin given at 10pm     Home DM Meds: Trulicity 1.5 mg Qweek       Humalog 75/25 Insulin 40 units AM/ 35 units PM  Current Orders: NPH Insulin 20 units BID      Novolog Moderate Correction Scale/ SSI (0-15 units) TID AC    MD- Having slight glucose elevation at 12pm.  Having more significant glucose elevations at 4pm and bedtime.  Please consider adding Custom Novolog Meal Coverage:  Novolog 3 units with Breakfast Novolog 4 units with Lunch Novolog 6 units with Dinner  HOLD if patient NPO or eats less than 50% of meal    --Will follow patient during hospitalization--  Wyn Quaker RN, MSN, CDE Diabetes Coordinator Inpatient Glycemic Control Team Team Pager: [phone] (8a-5p)

## 2020-01-14 NOTE — Care Management (Signed)
Zack with [facility] came to discuss walker with patient. Patient already left. Patient received a walker through insurance on 09/21/19 , insurance will not cover another walker. Left message for patient. Magdalen Spatz RN

## 2020-01-14 NOTE — TOC Transition Note (Signed)
Transition of Care [facility]) - CM/SW Discharge Note   Patient Details  Name: [name] [name] MRN: [id number] Date of Birth: 02-08-1951  Transition of Care [facility]) CM/SW Contact:  Alexander Mt, LCSW Phone Number: 01/14/2020, 12:01 PM   Clinical Narrative:    Rolling walker ordered through [employer] to be delivered to pt room. Per RNCM arrangements pt will have [place] therapies through [facility]. Her son and aides will provide assistance at home.   Final next level of care: [place] Barriers to Discharge: Barriers Resolved   Patient Goals and CMS Choice Patient states their goals for this hospitalization and ongoing recovery are:: to return to home CMS Medicare.gov Compare Post Acute Care list provided to:: Patient Choice offered to / list presented to : Patient   Discharge Plan and Services Discharge Planning Services: CM Consult Post Acute Care Choice: Home Health          DME Arranged: Walker rolling DME Agency: [employer] Date DME Agency Contacted: 01/14/20 Time DME Agency Contacted: 46 Representative spoke with at DME Agency: [place]: PT [place]: [place] ([place]) Date [place]: 01/07/20 Time [place]: 1052 Representative spoke with at [place]: Butch Penny  Readmission Risk Interventions Readmission Risk Prevention Plan 01/14/2020  Transportation Screening Complete  Medication Review [employer] photographer) Complete  PCP or Specialist appointment within 3-5 days of discharge Complete  HRI or Home Care Consult Complete  SW Recovery Care/Counseling Consult Complete  Palliative Care Screening Not [place] Complete  Some recent data might be hidden

## 2020-01-14 NOTE — Progress Notes (Signed)
Discharged patient to home, AVS given and explained. Patient verbalized that they won't be waiting for the walker be delivered to room. Questions answered to satisfaction, belongings returned accordingly.

## 2020-01-14 NOTE — Progress Notes (Addendum)
[facility] NEPHROLOGY PROGRESS NOTE  Assessment/ Plan: Pt is a 69 y.o. yo female history of CKD stage IV with baseline creatinine level between 3-4, hypertension, DM, CHF presented with sepsis due to E. coli/UTI.  #Acute kidney injury on CKD stage IV likely due to sepsis and the hemodynamic changes due to CHF.  UA with UTI.  CT scan with no obstruction.   Responding with IV diuretics.  Nonoliguric and serum creatinine level stable.  BUN is remains about trending down.  She does not have any uremic features.  No urgent need for HD and patient is hesitant to start dialysis.  Okay to discharge today with Lasix 80 mg twice a day, Lokelma.  I advised patient to restrict fluid and salt intake.  We will arrange outpatient follow-up with Dr [name] [name].    #Sepsis due to UTI/E. coli bacteremia: Completed antibiotics.  #Hypertension: Monitor BP.  On Coreg.  #Anemia of CKD: Iron saturation only 4%.  Received IV iron after improvement from sepsis.  Aranesp on 4/6.  #Secondary hyperparathyroidism: PTH level 79 on 12/04/2019.  Phosphorus level at goal.   Continue Sensipar.  #Hyperkalemia: Discontinued KCl on 4/5.  She was also taking salt substitute which contains potassium.  I advised her to discontinue taking salt substitute.  Receiving Lokelma.   Subjective: Seen and examined at bedside.  Denies nausea vomiting chest pain shortness of breath.  No tremor, asterixis or dysgeusia.  Very eager to go home today.   Objective Vital signs in last 24 hours: Vitals:   01/13/20 2041 01/13/20 2051 01/14/20 0517 01/14/20 0821  BP:   (!) 133/50   Pulse: 68  79 83  Resp: 14  16   Temp:   98 F (36.7 C)   TempSrc:   Oral   SpO2: 98% 97% 99% 97%  Weight:      Height:       Weight change:   Intake/Output Summary (Last 24 hours) at 01/14/2020 1130 Last data filed at 01/14/2020 0630 Gross per 24 hour  Intake -  Output 500 ml  Net -500 ml       Labs: Basic Metabolic Panel: Recent Labs  Lab  01/12/20 1658 01/13/20 0720 01/14/20 0337  NA 139 140 141  K 6.0* 5.3* 5.1  CL 96* 95* 96*  CO2 32 31 32  GLUCOSE 211* 160* 158*  BUN 124* 121* 119*  CREATININE 4.28* 3.51* 3.70*  CALCIUM 9.2 9.3 9.1  PHOS 3.1 3.4 3.9   Liver Function Tests: Recent Labs  Lab 01/08/20 0347 01/08/20 0347 01/09/20 0451 01/09/20 0451 01/10/20 0609 01/12/20 0238 01/12/20 1658 01/13/20 0720 01/14/20 0337  AST 28  --  20  --  23  --   --   --   --   ALT 19  --  17  --  17  --   --   --   --   ALKPHOS 54  --  51  --  51  --   --   --   --   BILITOT 0.6  --  0.7  --  0.4  --   --   --   --   PROT 5.8*  --  5.8*  --  6.3*  --   --   --   --   ALBUMIN 2.3*   < > 2.2*   < > 2.4*   < > 2.6* 2.6* 2.6*   < > = values in this interval not displayed.  No results for input(s): LIPASE, AMYLASE in the last 168 hours. No results for input(s): AMMONIA in the last 168 hours. CBC: Recent Labs  Lab 01/09/20 0451 01/09/20 0451 01/10/20 0609 01/10/20 0609 01/11/20 1010 01/12/20 0238 01/13/20 0720  WBC 8.2   < > 7.0   < > 8.7 9.9 10.1  HGB 7.6*   < > 7.7*   < > 7.9* 7.7* 7.9*  HCT 24.1*   < > 25.5*   < > 26.1* 25.3* 26.3*  MCV 88.9  --  89.8  --  89.4 90.7 88.9  PLT 261   < > 290   < > 358 393 458*   < > = values in this interval not displayed.   Cardiac Enzymes: Recent Labs  Lab 01/08/20 0347  CKTOTAL 81   CBG: Recent Labs  Lab 01/13/20 0758 01/13/20 1201 01/13/20 1657 01/13/20 2017 01/14/20 0857  GLUCAP 146* 183* 223* 312* 141*    Iron Studies:  No results for input(s): IRON, TIBC, TRANSFERRIN, FERRITIN in the last 72 hours. Studies/Results: No results found.  Medications: Infusions:   Scheduled Medications: . allopurinol  100 mg Oral Daily  . bisacodyl  10 mg Rectal Daily  . carvedilol  12.5 mg Oral BID  . cholecalciferol  2,000 Units Oral Daily  . cinacalcet  30 mg Oral Q breakfast  . clopidogrel  75 mg Oral Daily  . docusate sodium  100 mg Oral Daily  . fenofibrate   160 mg Oral Daily  . furosemide  80 mg Oral BID  . heparin  5,000 Units Subcutaneous Q8H  . hydrOXYzine  25 mg Oral BID  . insulin aspart  0-15 Units Subcutaneous TID WC  . insulin NPH Human  20 Units Subcutaneous BID AC & HS  . levothyroxine  150 mcg Oral Q0600  . magnesium oxide  400 mg Oral BID  . mirabegron ER  25 mg Oral Daily  . mometasone-formoterol  2 puff Inhalation BID  . multivitamin with minerals  1 tablet Oral Daily  . pantoprazole  40 mg Oral Daily  . simvastatin  20 mg Oral QHS  . sodium zirconium cyclosilicate  10 g Oral Daily  . tiZANidine  4 mg Oral BID    have reviewed scheduled and prn medications.  Physical Exam: General: Not in distress, comfortable. Heart:RRR, s1s2 nl, no rubs Lungs:clear b/l, no crackle Abdomen:soft, Non-tender, non-distended Extremities: Lower extremity not pitting edema stable. Neurology: Alert awake and following commands.  [name] [name] [name] [name] 01/14/2020,11:30 AM  LOS: 7 days  Pager: [phone]

## 2020-01-20 ENCOUNTER — Telehealth (HOSPITAL_COMMUNITY): Payer: Self-pay | Admitting: *Deleted

## 2020-01-20 LAB — PTH-RELATED PEPTIDE: PTH-related peptide: <2 pmol/L

## 2020-01-20 NOTE — Telephone Encounter (Signed)
Physical therapist with [facility] called to report pt had an increase in fluid an shortness of breath since her hospital discharge on 4/8. Pt was admitted for UTI and AKI dialysis was recommended patient refused. Patient saw her kidney doctor yesterday and had labs drawn. Per the patient she was told by her kidney doctor her kidneys looked a lot better. Patient has not been seen by our office since May 2020. I scheduled patient for our next available appointment and have requested lab results from her kidney doctor Dr. [name]. Once I receive her lab results I will follow up with a provider.

## 2020-01-20 NOTE — Discharge Summary (Addendum)
[facility] Discharge Summary   Patient: [name] [name] NWG:[id number]  PCP: [name], MD  Date of admission: 01/05/2020   Date of discharge: 01/14/2020      Discharge Diagnoses:   Principal Problem:   UTI (urinary tract infection) Active Problems:   Tobacco abuse   CKD (chronic kidney disease), stage IV (HCC)   Morbid obesity ([place])   HTN (hypertension)   DM (diabetes mellitus), type 2 with renal complications ([place])   Chronic CHF (congestive heart failure) ([place])   Bacteremia   Anemia, unspecified   Admitted From: home Disposition:  Home   Recommendations for Outpatient Follow-up:  1. PCP: follow up with PCP in 1 week 2. Follow up LABS/TEST:  none  Follow-up Information    [facility] Follow up.        [name], MD. Schedule an appointment as soon as possible for a visit in 1 week(s).   Specialty: Internal Medicine Contact information: [address] [place] [address] [phone]        Larey Dresser, MD .   Specialty: Cardiology Contact information: [place] [address] [phone]          Diet recommendation: Cardiac diet  Activity: The patient is advised to gradually reintroduce usual activities, as tolerated  Discharge Condition: stable  Code Status: Full code   History of present illness: As per the H and P dictated on admission, "[name] [name] is a 69 y.o. 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  Pt presents to ED with 2 day h/o lower abd pain, associated N/V.  No radiation.  Associated chills, fever, dysuria, and reported hypotension at home.  No CP, no SOB."  Hospital Course:   Summary of her active problems in the hospital is as following. Sepsis secondary to UTI (urinary tract infection)/gram-negative/E. coli bacteremia: Patient met criteria for sepsis on admission given leukocytosis, tachycardia and tachypnea with urinary source.    Urine and blood cultures grew out E. coli, near pansensitive.  Sepsis itself stabilized.  Was on Rocephin.  Antibiotics switched to Keflex.  Course completed.  Tobacco abuse: Counseled to quit, declined nicotine patch  Acute kidney injury in the setting of CKD (chronic kidney disease), stage IV/V ([place]): Followed by nephrology.  Given some IV fluids, her renal function proved but she developed dyspnea.  Fluids were stopped and she was started on Lasix.  Renal function has plateaued since last 3 days.  Was on Lasix for last 3 days but evening dose on 01/11/2020 was held.  Now feels some dyspnea.  Still with bibasilar crackles.  Now also has hyperkalemia and early signs of uremia.  Nephrology has recommended her dialysis however she has not made a final decision yet.  Her Lasix has been resumed. Pt has been diuresing well.  "Okay to discharge today with Lasix 80 mg twice a day, Lokelma.  I advised patient to restrict fluid and salt intake.  We will arrange outpatient follow-up with [name]."  Hyperkalemia: Lasix resumed.  She is receiving Lokelma ordered by nephrology.   Morbid obesity [facility]): Patient criteria BMI greater than 40  Essential HTN (hypertension): Very well controlled.  Continue home dose of carvedilol.  DM (diabetes mellitus), type 2 with renal complications [facility]): Blood sugar fairly stable now.  Continue current NPH.  Continue to hold premeal that she was on and became hypoglycemic.  Continue SSI.    Acute on chronic systolic  CHF (congestive heart failure) ([place]) causing acute respiratory failure with hypoxia: Last echo in June 2020 shows ejection fraction of 40 to 45%.  She is on diuretics now.  Still has basal crackles.  Oxygen requirement improving.  Currently on 2 L nasal correction.  Encourage incentive spirometry.  Anemia of chronic disease: Baseline hemoglobin between 10 and 11.  Currently 7.7.  No indication of transfusion.  Monitor.  Patient was seen by physical  therapy, who recommended Home health, which was arranged. On the day of the discharge the patient's vitals were stable, and no other acute medical condition were reported by patient. the patient was felt safe to be discharge at Home with Home health.  Consultants: Nephrology  Procedures: none  Discharge Exam: General: Appear in no distress, no Rash; Oral Mucosa Clear, moist. Cardiovascular: S1 and S2 Present, no Murmur, Respiratory: normal respiratory effort, Bilateral Air entry present and no Crackles, no wheezes Abdomen: Bowel Sound present, Soft and no tenderness, no hernia Extremities: no Pedal edema, no calf tenderness Neurology: alert and oriented to time, place, and person affect appropriate.  Filed Weights   01/05/20 1828 01/06/20 0158  Weight: 113.4 kg 113.4 kg   Vitals:   01/14/20 0517 01/14/20 0821  BP: (!) 133/50   Pulse: 79 83  Resp: 16   Temp: 98 F (36.7 C)   SpO2: 99% 97%    DISCHARGE MEDICATION: Allergies as of 01/14/2020      Reactions   Nebivolol Swelling   Chest pain   Ace Inhibitors Swelling, Other (See Comments)   Tongue swell   Morphine And Related Itching      Medication List    STOP taking these medications   metolazone 5 MG tablet Commonly known as: ZAROXOLYN   Potassium Chloride ER 20 MEQ Tbcr   torsemide 20 MG tablet Commonly known as: DEMADEX     TAKE these medications   albuterol (2.5 MG/3ML) 0.083% nebulizer solution Commonly known as: PROVENTIL Take 2.5 mg by nebulization every 6 (six) hours as needed for wheezing or shortness of breath. Notes to patient: Last given 4/8 at 0932 am   ProAir HFA 108 (90 Base) MCG/ACT inhaler Generic drug: albuterol Inhale 2 puffs into the lungs 4 (four) times daily as needed for wheezing or shortness of breath. Notes to patient: Last given 4/8 at 0932   allopurinol 100 MG tablet Commonly known as: ZYLOPRIM Take 100 mg by mouth every morning. Notes to patient: Next dose tomorrow morning at 10  am   budesonide-formoterol 160-4.5 MCG/ACT inhaler Commonly known as: SYMBICORT Inhale 2 puffs into the lungs 2 (two) times daily as needed (for SOB).   carvedilol 12.5 MG tablet Commonly known as: COREG Take 12.5 mg by mouth 2 (two) times daily. Notes to patient: Next dose this evening at 10 pm   cetirizine 10 MG tablet Commonly known as: ZYRTEC Take 10 mg by mouth daily.   cinacalcet 30 MG tablet Commonly known as: SENSIPAR Take 30 mg by mouth daily. Notes to patient: Next dose tomorrow morning at 10 am   clopidogrel 75 MG tablet Commonly known as: PLAVIX TAKE 1 TABLET BY MOUTH EVERY DAY Notes to patient: Next dose tomorrow morning at 10 am   Dulaglutide 1.5 MG/0.5ML Sopn Inject 1.5 mg into the skin every Monday.   fenofibrate 145 MG tablet Commonly known as: TRICOR TAKE 1 TABLET (145 MG TOTAL) BY MOUTH DAILY. What changed: See the new instructions. Notes to patient: Next dose tomorrow morning at 10 am  fluticasone 50 MCG/ACT nasal spray Commonly known as: FLONASE Place 2 sprays into the nose as needed for allergies.   furosemide 80 MG tablet Commonly known as: LASIX Take 1 tablet (80 mg total) by mouth 2 (two) times daily. Notes to patient: Next dose this evening at 10 pm   hydrOXYzine 25 MG tablet Commonly known as: ATARAX/VISTARIL Take 25 mg by mouth 2 (two) times daily. Notes to patient: Next dose this evening at 10 pm   insulin lispro protamine-lispro (75-25) 100 UNIT/ML Susp injection Commonly known as: HUMALOG 75/25 MIX Inject 35-40 Units into the skin See admin instructions. Inject 40 units into the skin morning and 35 units in the evening   levothyroxine 150 MCG tablet Commonly known as: SYNTHROID Take 150 mcg by mouth daily before breakfast. Notes to patient: Next dose tomorrow morning before breakfast   magnesium oxide 400 (241.3 Mg) MG tablet Commonly known as: MAG-OX Take 1 tablet (400 mg total) by mouth 2 (two) times daily. Notes to  patient: Next dose this evening at 10 pm   multivitamin with minerals Tabs tablet Take 1 tablet by mouth daily. Notes to patient: Next dose tomorrow morning   Myrbetriq 50 MG Tb24 tablet Generic drug: mirabegron ER Take 50 mg by mouth daily. Notes to patient: Next dose tomorrow morning   oxyCODONE-acetaminophen 10-325 MG tablet Commonly known as: PERCOCET Take 1 tablet by mouth every 4 (four) hours as needed for pain. Notes to patient: Last given 4/8 at 0735   pantoprazole 40 MG tablet Commonly known as: PROTONIX Take 1 tablet (40 mg total) by mouth daily. Notes to patient: Next dose tomorrow morning   polyvinyl alcohol 1.4 % ophthalmic solution Commonly known as: LIQUIFILM TEARS Place 2 drops into both eyes daily as needed for dry eyes.   simvastatin 20 MG tablet Commonly known as: ZOCOR Take 20 mg by mouth at bedtime. Notes to patient: Due tonight at bedtime   sodium zirconium cyclosilicate 10 g Pack packet Commonly known as: LOKELMA Take 10 g by mouth daily. Notes to patient: Next dose tomorrow morning at 10 am   tiZANidine 4 MG tablet Commonly known as: ZANAFLEX Take 4 mg by mouth 2 (two) times daily. Notes to patient: Next dose this evening at 10 pm   VITAMIN B COMPLEX PO Take 1 tablet by mouth 2 (two) times daily.   Vitamin D 50 MCG (2000 UT) Caps Take 2,000 Units by mouth daily. Notes to patient: Next dose tomorrow morning      Allergies  Allergen Reactions  . Nebivolol Swelling    Chest pain  . Ace Inhibitors Swelling and Other (See Comments)    Tongue swell  . Morphine And Related Itching   Discharge Instructions    Diet renal [id number]   Complete by: As directed    Discharge instructions   Complete by: As directed    It is important that you read the given instructions as well as go over your medication list with RN to help you understand your care after this hospitalization.  Please follow-up with PCP in 1-2 weeks.  Please note that NO  REFILLS for any discharge medications will be authorized once you are discharged, as it is imperative that you return to your primary care physician (or establish a relationship with a primary care physician if you do not have one) for your aftercare needs so that they can reassess your need for medications and monitor your lab values.  Please request your primary care physician  to go over all Hospital Tests and Procedure/Radiological results at the follow up. Please get all Hospital records sent to your PCP by signing hospital release before you go home.   Do not take more than prescribed Pain, Sleep and Anxiety Medications.  You were cared for by a hospitalist during your hospital stay. If you have any questions about your discharge medications or the care you received while you were in the hospital after you are discharged, you can call the unit '@UNIT' @ you were admitted to and ask to speak with the hospitalist Berle Mull. Ask for Hospitalist on call if the hospitalist that took care of you is not available.   Once you are discharged, your primary care physician will handle any further medical issues.  You Must read complete instructions/literature along with all the possible adverse reactions/side effects for all the Medicines you take and that have been prescribed to you. Take any new Medicines after you have completely understood and accept all the possible adverse reactions/side effects.  If you have smoked or chewed Tobacco in the last 2 yrs please STOP smoking If you drink alcohol, please safely reduce the use. Do not drive, operating heavy machinery, perform activities at heights, swimming or participation in water activities or provide baby sitting services under influence.  Wear Seat belts while driving.   Increase activity slowly   Complete by: As directed       The results of significant diagnostics from this hospitalization (including imaging, microbiology, ancillary and  laboratory) are listed below for reference.    Significant Diagnostic Studies: DG CHEST PORT 1 VIEW  Result Date: 01/07/2020 CLINICAL DATA:  Shortness of breath EXAM: PORTABLE CHEST 1 VIEW COMPARISON:  01/06/2020 FINDINGS: The heart size and mediastinal contours are stable. Atherosclerotic calcification of the aortic knob. Unchanged mildly prominent interstitial markings. No focal airspace consolidation, pleural effusion, or pneumothorax. IMPRESSION: No active disease.  Stable exam. Electronically Signed   By: Davina Poke D.O.   On: 01/07/2020 11:32   DG Chest Port 1 View  Result Date: 01/06/2020 CLINICAL DATA:  Hypotension EXAM: PORTABLE CHEST 1 VIEW COMPARISON:  09/15/2019 plain film, CT from 08/10/2019 FINDINGS: Cardiac shadow is within normal limits. Aortic calcifications are seen. The lungs are clear bilaterally. No focal infiltrate is noted. Subpleural fat collection is again noted in the anterior aspect of the right apex stable from prior CT examination. Known left nodule is not as well appreciated on today's exam. No bony abnormality is noted. IMPRESSION: Chronic changes without acute abnormality. Electronically Signed   By: Inez Catalina M.D.   On: 01/06/2020 02:03   CT Renal Stone Study  Result Date: 01/06/2020 CLINICAL DATA:  Abdominal pain, chills, vomiting, infection EXAM: CT ABDOMEN AND PELVIS WITHOUT CONTRAST TECHNIQUE: Multidetector CT imaging of the abdomen and pelvis was performed following the standard protocol without IV contrast. COMPARISON:  05/06/2017 FINDINGS: Lower chest: Lung bases are clear. Hepatobiliary: Hepatomegaly with mildly nodular hepatic contour. Status post cholecystectomy. No intrahepatic or extrahepatic ductal dilatation. Pancreas: Within normal limits. Spleen: Within normal limits. Adrenals/Urinary Tract: Low-density thickening of the bilateral renal glands, suggesting adrenal adenomas, unchanged. Bilateral renal cysts, including a 2.3 cm hemorrhagic cyst in  the posterior right lower kidney (series 3/image 45) and a 2.1 cm simple cyst in the anterior left upper kidney (series 3/image 37). Nonspecific perinephric stranding bilaterally. No renal, ureteral, or bladder calculi. No hydronephrosis. Bladder is partially obscured by streak artifact but is grossly unremarkable. Stomach/Bowel: Stomach is within normal limits.  No evidence of bowel obstruction. Normal appendix (series 2/image 65). Status post left hemicolectomy with suture line in the lower pelvis (series 3/image 69). Vascular/Lymphatic: No evidence of abdominal aortic aneurysm. Atherosclerotic calcifications of the abdominal aorta and branch vessels. No suspicious abdominopelvic lymphadenopathy. Reproductive: Status post hysterectomy. No adnexal masses. Other: No abdominopelvic ascites. Musculoskeletal: Degenerative changes of the visualized thoracolumbar spine. Status post PLIF at L4-5. Right hip arthroplasty, without evidence of complication. Mild degenerative changes of the left hip. IMPRESSION: No evidence of bowel obstruction.  Normal appendix. Status post cholecystectomy, left hemicolectomy, and hysterectomy. Hepatomegaly with nodular hepatic contour, at least raising the possibility of early cirrhosis. Bilateral renal adenomas. No CT findings to account for the patient's abdominal pain. Electronically Signed   By: Julian Hy M.D.   On: 01/06/2020 02:44   VAS [name] UPPER EXT VEIN MAPPING (PRE-OP AVF)  Result Date: 01/10/2020 UPPER EXTREMITY VEIN MAPPING  Indications: Pre-Dialysis access. Comparison Study: No prior study Performing Technologist: Sharion Dove RVS  Examination Guidelines: A complete evaluation includes B-mode imaging, spectral Doppler, color Doppler, and power Doppler as needed of all accessible portions of each vessel. Bilateral testing is considered an integral part of a complete examination. Limited examinations for reoccurring indications may be performed as noted.  +-----------------+-------------+----------+--------+ Right Cephalic   Diameter (cm)Depth (cm)Findings +-----------------+-------------+----------+--------+ Prox upper arm       0.32        0.27            +-----------------+-------------+----------+--------+ Mid upper arm        0.26        0.28   tortuous +-----------------+-------------+----------+--------+ Dist upper arm       0.26        0.34   tortuous +-----------------+-------------+----------+--------+ Antecubital fossa    0.20        0.22   tortuous +-----------------+-------------+----------+--------+ Prox forearm         0.26        0.14   thrombus +-----------------+-------------+----------+--------+ Mid forearm          0.33        0.85            +-----------------+-------------+----------+--------+ Wrist                0.25        0.47            +-----------------+-------------+----------+--------+ +-----------------+-------------+----------+---------+ Right Basilic    Diameter (cm)Depth (cm)Findings  +-----------------+-------------+----------+---------+ Mid upper arm        0.72        2.80    origin   +-----------------+-------------+----------+---------+ Dist upper arm       0.53        2.45             +-----------------+-------------+----------+---------+ Antecubital fossa    0.37        1.18             +-----------------+-------------+----------+---------+ Prox forearm         0.28        0.72   branching +-----------------+-------------+----------+---------+ Mid forearm          0.24        0.53   branching +-----------------+-------------+----------+---------+ Wrist                0.25        0.23             +-----------------+-------------+----------+---------+ +-----------------+-------------+----------+--------+ Left Cephalic    Diameter (cm)Depth (cm)Findings +-----------------+-------------+----------+--------+  Prox upper arm       0.17        0.16             +-----------------+-------------+----------+--------+ Mid upper arm        0.23        0.24   tortuous +-----------------+-------------+----------+--------+ Dist upper arm       0.21        0.23   tortuous +-----------------+-------------+----------+--------+ Antecubital fossa    0.20        0.13            +-----------------+-------------+----------+--------+ Prox forearm         0.26        0.88            +-----------------+-------------+----------+--------+ Mid forearm          0.19        0.18            +-----------------+-------------+----------+--------+ Dist forearm         0.24        0.18            +-----------------+-------------+----------+--------+ Wrist                0.16        0.25            +-----------------+-------------+----------+--------+ +-----------------+-------------+----------+---------+ Left Basilic     Diameter (cm)Depth (cm)Findings  +-----------------+-------------+----------+---------+ Prox upper arm       0.53        2.85    origin   +-----------------+-------------+----------+---------+ Mid upper arm        0.46        2.50             +-----------------+-------------+----------+---------+ Dist upper arm       0.53        2.63   branching +-----------------+-------------+----------+---------+ Antecubital fossa    0.32        0.69             +-----------------+-------------+----------+---------+ Prox forearm         0.36        0.34   branching +-----------------+-------------+----------+---------+ Mid forearm          0.31        0.82   branching +-----------------+-------------+----------+---------+ Wrist                0.26        0.47             +-----------------+-------------+----------+---------+ *See table(s) above for measurements and observations.  Diagnosing physician: Curt Jews MD Electronically signed by Curt Jews MD on 01/10/2020 at 8:17:25 AM.    Final     Microbiology: No  results found for this or any previous visit (from the past 240 hour(s)).   Labs: CBC: No results for input(s): WBC, NEUTROABS, HGB, HCT, MCV, PLT in the last 168 hours. Basic Metabolic Panel: Recent Labs  Lab 01/14/20 0337  NA 141  K 5.1  CL 96*  CO2 32  GLUCOSE 158*  BUN 119*  CREATININE 3.70*  CALCIUM 9.1  PHOS 3.9   Liver Function Tests: Recent Labs  Lab 01/14/20 0337  ALBUMIN 2.6*   No results for input(s): LIPASE, AMYLASE in the last 168 hours. No results for input(s): AMMONIA in the last 168 hours. Cardiac Enzymes: No results for input(s): CKTOTAL, CKMB, CKMBINDEX, TROPONINI in the last 168 hours. BNP (last 3 results) Recent Labs  03/16/19 1341 04/08/19 1245  BNP 196.4* 294.4*   CBG: Recent Labs  Lab 01/13/20 1201 01/13/20 1657 01/13/20 2017 01/14/20 0857 01/14/20 1227  GLUCAP 183* 223* 312* 141* 222*    Time spent: 35 minutes  Signed:  Berle Mull  [facility] 01/14/2020 7:58 AM

## 2020-01-21 NOTE — Telephone Encounter (Signed)
Second call to Dr.Kruska's office for lab results. I was told they would refax results right away.

## 2020-01-28 ENCOUNTER — Other Ambulatory Visit (HOSPITAL_COMMUNITY): Payer: Self-pay

## 2020-01-28 NOTE — Telephone Encounter (Signed)
Pts home health nurse Lavella Hammock called to report pt was very short of breath and had a lot of swelling. Pt was recently seen in the hospital and her creat has been consistently around 3.5 patient was advised she needs to start dialysis pt refused dialysis. Hospital changed her torsemide 80mg  in the am and 40mg  in the pm to lasix 80mg  bid and stopped her metolazone. Pt was seen by her kidney doctor after discharge and was told she needed dialysis pt still refused. Per Kevan Rosebush, RN pt needs to go to the emergency room we have not seen the patient since 02/2019 and her creatinine is too elevated to adjust medications at home. Starla aware and will have patient go to the emergency room.

## 2020-01-29 ENCOUNTER — Ambulatory Visit (HOSPITAL_COMMUNITY)
Admission: RE | Admit: 2020-01-29 | Discharge: 2020-01-29 | Disposition: A | Discharge status: Home / Self Care | Payer: Medicare Other | Source: Ambulatory Visit | Attending: Internal Medicine | Admitting: Internal Medicine

## 2020-01-29 ENCOUNTER — Other Ambulatory Visit: Payer: Self-pay

## 2020-01-29 VITALS — BP 124/65 | HR 71 | Temp 96.7°F | Resp 18

## 2020-01-29 DIAGNOSIS — N179 Acute kidney failure, unspecified: Secondary | ICD-10-CM

## 2020-01-29 DIAGNOSIS — N184 Chronic kidney disease, stage 4 (severe): Secondary | ICD-10-CM | POA: Diagnosis present

## 2020-01-29 LAB — POCT HEMOGLOBIN-HEMACUE: Hemoglobin: 8.5 g/dL — ABNORMAL LOW (ref 12.0–15.0)

## 2020-01-29 MED ORDER — EPOETIN ALFA-EPBX 10000 UNIT/ML IJ SOLN: 30000.0000 [IU] | INTRAMUSCULAR | Status: DC

## 2020-01-29 MED ORDER — SODIUM CHLORIDE 0.9 % IV SOLN
300.0000 mg | Freq: Once | INTRAVENOUS | Status: AC
  Administered 2020-01-29: 300 mg via INTRAVENOUS
  Filled 2020-01-29: qty 15

## 2020-01-29 MED ORDER — EPOETIN ALFA-EPBX 10000 UNIT/ML IJ SOLN
INTRAMUSCULAR | Status: AC
  Administered 2020-01-29: 30000 [IU]
  Filled 2020-01-29: qty 3

## 2020-02-02 ENCOUNTER — Encounter (HOSPITAL_COMMUNITY): Payer: Self-pay

## 2020-02-02 ENCOUNTER — Other Ambulatory Visit: Payer: Self-pay

## 2020-02-02 ENCOUNTER — Ambulatory Visit (HOSPITAL_COMMUNITY)
Admission: RE | Admit: 2020-02-02 | Discharge: 2020-02-02 | Disposition: A | Discharge status: Home / Self Care | Payer: Medicare Other | Source: Ambulatory Visit | Attending: Cardiology | Admitting: Cardiology

## 2020-02-02 VITALS — BP 144/82 | HR 76 | Wt 268.6 lb

## 2020-02-02 DIAGNOSIS — E785 Hyperlipidemia, unspecified: Secondary | ICD-10-CM | POA: Diagnosis not present

## 2020-02-02 DIAGNOSIS — I132 Hypertensive heart and chronic kidney disease with heart failure and with stage 5 chronic kidney disease, or end stage renal disease: Secondary | ICD-10-CM | POA: Diagnosis not present

## 2020-02-02 DIAGNOSIS — Z79899 Other long term (current) drug therapy: Secondary | ICD-10-CM | POA: Insufficient documentation

## 2020-02-02 DIAGNOSIS — E1151 Type 2 diabetes mellitus with diabetic peripheral angiopathy without gangrene: Secondary | ICD-10-CM | POA: Diagnosis not present

## 2020-02-02 DIAGNOSIS — G894 Chronic pain syndrome: Secondary | ICD-10-CM | POA: Diagnosis not present

## 2020-02-02 DIAGNOSIS — Z8673 Personal history of transient ischemic attack (TIA), and cerebral infarction without residual deficits: Secondary | ICD-10-CM | POA: Insufficient documentation

## 2020-02-02 DIAGNOSIS — J449 Chronic obstructive pulmonary disease, unspecified: Secondary | ICD-10-CM | POA: Diagnosis not present

## 2020-02-02 DIAGNOSIS — M109 Gout, unspecified: Secondary | ICD-10-CM | POA: Insufficient documentation

## 2020-02-02 DIAGNOSIS — Z7901 Long term (current) use of anticoagulants: Secondary | ICD-10-CM | POA: Insufficient documentation

## 2020-02-02 DIAGNOSIS — E1122 Type 2 diabetes mellitus with diabetic chronic kidney disease: Secondary | ICD-10-CM | POA: Diagnosis not present

## 2020-02-02 DIAGNOSIS — I5042 Chronic combined systolic (congestive) and diastolic (congestive) heart failure: Secondary | ICD-10-CM | POA: Insufficient documentation

## 2020-02-02 DIAGNOSIS — Z794 Long term (current) use of insulin: Secondary | ICD-10-CM | POA: Insufficient documentation

## 2020-02-02 DIAGNOSIS — M1990 Unspecified osteoarthritis, unspecified site: Secondary | ICD-10-CM | POA: Diagnosis not present

## 2020-02-02 DIAGNOSIS — I5023 Acute on chronic systolic (congestive) heart failure: Secondary | ICD-10-CM

## 2020-02-02 DIAGNOSIS — E039 Hypothyroidism, unspecified: Secondary | ICD-10-CM | POA: Diagnosis not present

## 2020-02-02 DIAGNOSIS — K219 Gastro-esophageal reflux disease without esophagitis: Secondary | ICD-10-CM | POA: Insufficient documentation

## 2020-02-02 DIAGNOSIS — Z6841 Body Mass Index (BMI) 40.0 and over, adult: Secondary | ICD-10-CM | POA: Diagnosis not present

## 2020-02-02 DIAGNOSIS — N185 Chronic kidney disease, stage 5: Secondary | ICD-10-CM | POA: Diagnosis not present

## 2020-02-02 DIAGNOSIS — Z8249 Family history of ischemic heart disease and other diseases of the circulatory system: Secondary | ICD-10-CM | POA: Insufficient documentation

## 2020-02-02 DIAGNOSIS — F17210 Nicotine dependence, cigarettes, uncomplicated: Secondary | ICD-10-CM | POA: Insufficient documentation

## 2020-02-02 DIAGNOSIS — Z8614 Personal history of Methicillin resistant Staphylococcus aureus infection: Secondary | ICD-10-CM | POA: Diagnosis not present

## 2020-02-02 LAB — BASIC METABOLIC PANEL
Anion gap: 14 (ref 5–15)
BUN: 76 mg/dL — ABNORMAL HIGH (ref 8–23)
CO2: 39 mmol/L — ABNORMAL HIGH (ref 22–32)
Calcium: 9.9 mg/dL (ref 8.9–10.3)
Chloride: 89 mmol/L — ABNORMAL LOW (ref 98–111)
Creatinine, Ser: 3.21 mg/dL — ABNORMAL HIGH (ref 0.44–1.00)
GFR calc Af Amer: 16 mL/min — ABNORMAL LOW (ref >60)
GFR calc non Af Amer: 14 mL/min — ABNORMAL LOW (ref >60)
Glucose, Bld: 115 mg/dL — ABNORMAL HIGH (ref 70–99)
Potassium: 3.6 mmol/L (ref 3.5–5.1)
Sodium: 142 mmol/L (ref 135–145)

## 2020-02-02 MED ORDER — AMLODIPINE BESYLATE 5 MG PO TABS: 5.0000 mg | ORAL_TABLET | Freq: Every day | ORAL | 5 refills | Status: DC

## 2020-02-02 NOTE — Patient Instructions (Addendum)
START Amlodopine 5mg  (1 tab) daily  Continue your diuretics (fluid pills) as directed by your kidney doctors.   Your physician recommends that you schedule a follow-up appointment in: 6 weeks with the Nurse Practitioner/ Physician Assistant  Please call office at [phone] option 2 if you have any questions or concerns.   At the [facility], you and your health needs are our priority. As part of our continuing mission to provide you with exceptional heart care, we have created designated Provider Care Teams. These Care Teams include your primary Cardiologist (physician) and Advanced Practice Providers (APPs- Physician Assistants and Nurse Practitioners) who all work together to provide you with the care you need, when you need it.   You may see any of the following providers on your designated Care Team at your next follow up: Marland Kitchen Dr Glori Bickers . Dr Loralie Champagne . Darrick Grinder, NP . Lyda Jester, PA . Audry Riles, PharmD   Please be sure to bring in all your medications bottles to every appointment.

## 2020-02-02 NOTE — Progress Notes (Signed)
Advanced Heart Failure Clinic Note   Referring Physician: PCP: [name], MD PCP-Cardiologist: Dr. Aundra Dubin   HPI:  [name] Miyazaki Patrickis a 69 y.o.femalewith a history of TIAs, tobacco use, HTN, DM, HL, PAD followed by Dr Oneida Alar, chronic diastolic HF, CKD IV-V followed by [facility], morbid obesity and LBBB.  Admitted 10/20/2018 with volume overload despite high dose diuretics at home (lasix 160 bid + metolazone 5 mg 3x/week). Prior to admit she ran out of metolazone. Hospital course was complicated by AKI on CKD. Diuresed with IV lasix and transitioned to torsemide 80 mg /40 mg.   Admitted 2/20-2/21/20 with SOB and chest pressure in the setting of running out of her diuretics for 3-4 days and with ? Syncopal episode. Work up was unremarkable, troponin flat. Meds resumed.   Admitted 03/2019 for a/c CHF required IV diuretics. Echo showed LVEF 40-45%. RV not well visualized. RHC after diuresis showed low filling pressures, no significant CHF.   Recently admitted 01/2020 for UTI sepsis (gram-negative/E. coli bacteremia), treated w/ abx + IVF resuscitation>>leading to volume overload. Required IV Lasix. Had worsening renal function. Per d/c note, Nephrology was consulted and recommended dialysis but pt refused. Transitioned back to PO diuretics, high dose lasix initially w/ poor urinary response. Had f/u with nephrology and they changed back to torsemide 100 mg bid. Also on high dose metolazone 5 mg daily.   She presents to clinic today for f/u. Remains very edematous but tells me that she is responding better w/ torsemide and has noticed better urinary response compared to lasix and notes that her wt has been steadily improving since the change was made last week. She denies any uremic symptoms. She has chronic dyspnea at baseline but notes it is not any worse. No further dysuria. Denies fever or chills.     Review of Systems: [y] = yes, [ ]  = no   General: Weight  gain [ ] ; Weight loss [ ] ; Anorexia [ ] ; Fatigue [ ] ; Fever [ ] ; Chills [ ] ; Weakness [ ]   Cardiac: Chest pain/pressure [ ] ; Resting SOB [ ] ; Exertional SOB [ ] ; Orthopnea [ ] ; Pedal Edema [ ] ; Palpitations [ ] ; Syncope [ ] ; Presyncope [ ] ; Paroxysmal nocturnal dyspnea[ ]   Pulmonary: Cough [ ] ; Wheezing[ ] ; Hemoptysis[ ] ; Sputum [ ] ; Snoring [ ]   GI: Vomiting[ ] ; Dysphagia[ ] ; Melena[ ] ; Hematochezia [ ] ; Heartburn[ ] ; Abdominal pain [ ] ; Constipation [ ] ; Diarrhea [ ] ; BRBPR [ ]   GU: Hematuria[ ] ; Dysuria [ ] ; Nocturia[ ]   Vascular: Pain in legs with walking [ ] ; Pain in feet with lying flat [ ] ; Non-healing sores [ ] ; Stroke [ ] ; TIA [ ] ; Slurred speech [ ] ;  Neuro: Headaches[ ] ; Vertigo[ ] ; Seizures[ ] ; Paresthesias[ ] ;Blurred vision [ ] ; Diplopia [ ] ; Vision changes [ ]   Ortho/Skin: Arthritis [ ] ; Joint pain [ ] ; Muscle pain [ ] ; Joint swelling [ ] ; Back Pain [ ] ; Rash [ ]   Psych: Depression[ ] ; Anxiety[ ]   Heme: Bleeding problems [ ] ; Clotting disorders [ ] ; Anemia [ ]   Endocrine: Diabetes [ ] ; Thyroid dysfunction[ ]    Past Medical History:  Diagnosis Date  . Anemia   . Arthritis   . Asthma   . Bronchitis   . CHF (congestive heart failure) ([place])   . Chronic kidney disease   . Chronic kidney disease   . Chronic pain syndrome   . COPD (chronic obstructive pulmonary disease) ([place])   . Cramping of feet   .  Diabetes mellitus   . Dyspnea   . GERD (gastroesophageal reflux disease)   . Gout   . Headache   . History of hiatal hernia   . History of hip replacement, total   . History of transfusion   . Hx MRSA infection    abscess left groin  . Hx of cardiac cath 06/2014  . Hyperlipidemia   . Hypertension   . Hypothyroid   . 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 Morbidly obese ([place])   . Peripheral vascular disease ([place])   . Positive cardiac stress test 12/2013   anterior and lateral ischemia on Myoview  . Sleep apnea    UNABLE TO TOLERATE C PAP  . Stress incontinence   .  Thyroid disease   . TIA (transient ischemic attack)     X2 NO RESIDUAL PROBLEMS    Current Outpatient Medications  Medication Sig Dispense Refill  . albuterol (PROAIR HFA) 108 (90 Base) MCG/ACT inhaler Inhale 2 puffs into the lungs 4 (four) times daily as needed for wheezing or shortness of breath.    Marland Kitchen albuterol (PROVENTIL) (2.5 MG/3ML) 0.083% nebulizer solution Take 2.5 mg by nebulization every 6 (six) hours as needed for wheezing or shortness of breath.    . allopurinol (ZYLOPRIM) 100 MG tablet Take 100 mg by mouth every morning.     . B Complex Vitamins (VITAMIN B COMPLEX PO) Take 1 tablet by mouth 2 (two) times daily.     . budesonide-formoterol (SYMBICORT) 160-4.5 MCG/ACT inhaler Inhale 2 puffs into the lungs 2 (two) times daily as needed (for SOB).     . carvedilol (COREG) 12.5 MG tablet Take 12.5 mg by mouth 2 (two) times daily.    . cetirizine (ZYRTEC) 10 MG tablet Take 10 mg by mouth daily.     . Cholecalciferol (VITAMIN D) 50 MCG (2000 UT) CAPS Take 2,000 Units by mouth daily.    . cinacalcet (SENSIPAR) 30 MG tablet Take 30 mg by mouth daily.    . clopidogrel (PLAVIX) 75 MG tablet TAKE 1 TABLET BY MOUTH EVERY DAY (Patient taking differently: Take 75 mg by mouth daily. ) 30 tablet 0  . Dulaglutide 1.5 MG/0.5ML SOPN Inject 1.5 mg into the skin every Monday.     . fenofibrate (TRICOR) 145 MG tablet TAKE 1 TABLET (145 MG TOTAL) BY MOUTH DAILY. (Patient taking differently: Take 145 mg by mouth daily. ) 90 tablet 2  . fluticasone (FLONASE) 50 MCG/ACT nasal spray Place 2 sprays into the nose as needed for allergies.     . hydrOXYzine (ATARAX/VISTARIL) 25 MG tablet Take 25 mg by mouth 2 (two) times daily.     . insulin lispro protamine-lispro (HUMALOG 75/25 MIX) (75-25) 100 UNIT/ML SUSP injection Inject 35-40 Units into the skin See admin instructions. Inject 40 units into the skin morning and 35 units in the evening    . levothyroxine (SYNTHROID, LEVOTHROID) 150 MCG tablet Take 150 mcg by  mouth daily before breakfast.    . magnesium oxide (MAG-OX) 400 (241.3 Mg) MG tablet Take 1 tablet (400 mg total) by mouth 2 (two) times daily. 30 tablet 0  . metolazone (ZAROXOLYN) 5 MG tablet Take 5 mg by mouth daily.    . mirabegron ER (MYRBETRIQ) 50 MG TB24 tablet Take 50 mg by mouth daily.    . Multiple Vitamin (MULTIVITAMIN WITH MINERALS) TABS tablet Take 1 tablet by mouth daily.    Marland Kitchen oxyCODONE-acetaminophen (PERCOCET) 10-325 MG tablet Take 1 tablet by mouth every  4 (four) hours as needed for pain. 40 tablet 0  . pantoprazole (PROTONIX) 40 MG tablet Take 1 tablet (40 mg total) by mouth daily. 30 tablet 1  . polyvinyl alcohol (LIQUIFILM TEARS) 1.4 % ophthalmic solution Place 2 drops into both eyes daily as needed for dry eyes.    . simvastatin (ZOCOR) 20 MG tablet Take 20 mg by mouth at bedtime.  1  . sodium zirconium cyclosilicate (LOKELMA) 10 g PACK packet Take 10 g by mouth daily. 30 packet 0  . tiZANidine (ZANAFLEX) 4 MG tablet Take 4 mg by mouth 2 (two) times daily.     Marland Kitchen torsemide (DEMADEX) 100 MG tablet Take 100 mg by mouth 2 (two) times daily.     No current facility-administered medications for this encounter.    Allergies  Allergen Reactions  . Nebivolol Swelling    Chest pain  . Ace Inhibitors Swelling and Other (See Comments)    Tongue swell  . Morphine And Related Itching      Social History   Socioeconomic History  . Marital status: Legally Separated    Spouse name: Not on file  . Number of children: 1  . Years of education: college  . Highest education level: Not on file  Occupational History    Comment: Disabled  Tobacco Use  . Smoking status: Current Some Day Smoker    Packs/day: 0.25    Years: 40.00    Pack years: 10.00    Types: Cigarettes  . Smokeless tobacco: Never Used  Substance and Sexual Activity  . Alcohol use: Yes    Alcohol/week: 1.0 standard drinks    Types: 1 Standard drinks or equivalent per week    Comment: OCC  . Drug use: No     Comment: Quit four years ago.  Marland Kitchen Sexual activity: Not on file  Other Topics Concern  . Not on file  Social History Narrative   Patient is separated  and lives at home with her friend. Patient is disabled.   Education one year of college   Right handed   Caffeine two cups daily.      Social Determinants of Health   Financial Resource Strain:   . Difficulty of Paying Living Expenses:   Food Insecurity:   . Worried About Charity fundraiser in the Last Year:   . Arboriculturist in the Last Year:   Transportation Needs:   . Film/video editor (Medical):   Marland Kitchen Lack of Transportation (Non-Medical):   Physical Activity:   . Days of Exercise per Week:   . Minutes of Exercise per Session:   Stress:   . Feeling of Stress :   Social Connections:   . Frequency of Communication with Friends and Family:   . Frequency of Social Gatherings with Friends and Family:   . Attends Religious Services:   . Active Member of Clubs or Organizations:   . Attends Archivist Meetings:   Marland Kitchen Marital Status:   Intimate Partner Violence:   . Fear of Current or Ex-Partner:   . Emotionally Abused:   Marland Kitchen Physically Abused:   . Sexually Abused:       Family History  Problem Relation Age of Onset  . Diabetes Brother   . Cardiomyopathy Mother   . Heart disease Mother   . Cancer Father     Vitals:   02/02/20 1109  BP: (!) 176/92  Pulse: 76  SpO2: 97%  Weight: 121.8 kg (268 lb 9.6  oz)     PHYSICAL EXAM: General:  chronically ill appearing super morbidly obese female, presenting in wheel chair. No resting dyspnea.   HEENT: normal Neck: supple. Thick neck,  JVD assessment difficult. Carotids 2+ bilat; no bruits. No lymphadenopathy or thyromegaly appreciated. Cor: PMI nondisplaced. Regular rate & rhythm. No rubs, gallops or murmurs. Lungs: decreased BS bilaterally (likely 2/2 body habitus) Abdomen: obese but soft, nontender, nondistended. No hepatosplenomegaly. No bruits or masses. Good  bowel sounds. Extremities: no cyanosis, clubbing, rash, 3+ pedal edema Neuro: alert & oriented x 3, cranial nerves grossly intact. moves all 4 extremities w/o difficulty. Affect pleasant.   ASSESSMENT & PLAN:   1. Chronic Systolic CHF: Echo in 6/44 with EF 45-50%. Echo 03/2019 with EF 40-45%, RV not visualized. [place] 03/2019 showed low filling pressures, no significant CHF.  - she is very edematous and volume control c/b worsening renal function, now Stage IV-V CKD. She is followed closely by [facility] and nearing HD but continues on high dose diuretics and still making urine. Her diuretics at this point are managed by nephrology. Recently taken off lasix and changed back to  torsemide 100 mg bid and metolazone 5 mg daily. She states she is diuresing and her wt has been drifting down slowly over the last week since med change.  - She is chronically NYHA IIIb, but also in the setting of super morbid obesity and deconditioning Body mass index is 47.58 kg/m. She denies resting dyspnea.  - Nephrology will continue to manage her diuretics but suspect she is nearing HD. Will check BMP today to ensure that K is stable.  - GDMT limited by CKD. BP is elevated and needs better control  - She did not tolerate Bidil due to dizziness, visual changes and head aches  - Add amlodipine 5 mg daily  - Continue Coreg 6.25 mg bid.   2. Stage IV-V CKD - management per nephrology per above  - continues to make urine. No uremic symptoms  - check BMP today   3. HTN: elevated - add amlodipine 5 mg daily  - Continue coreg at current dose - intolerant to Bidil   4. HLD - continue statin - managed by PCP  5. DM - management per PCP  6. Obesity  - Body mass index is 47.58 kg/m.    F/u in 4 weeks   Lyda Jester, PA-C 02/02/20

## 2020-02-02 NOTE — Progress Notes (Signed)
Virtual Visit via Telephone Note   This visit type was conducted due to national recommendations for restrictions regarding the COVID-19 Pandemic (e.g. social distancing) in an effort to limit this patient's exposure and mitigate transmission in our community.  Due to her co-morbid illnesses, this patient is at least at moderate risk for complications without adequate follow up.  This format is felt to be most appropriate for this patient at this time.  The patient did not have access to video technology/had technical difficulties with video requiring transitioning to audio format only (telephone).  All issues noted in this document were discussed and addressed.  No physical exam could be performed with this format.  Please refer to the patient's chart for her  consent to telehealth for [facility].   Date:  02/03/2020   ID:  [name] [name], DOB Dec 30, 1950, MRN [id number]  Patient Location: Home Provider Location: Office  PCP:  [name], MD  Cardiologist:  Dr.Berry Advance Heart Failure: Dr.[name]  Electrophysiologist:  None   Evaluation Performed:  Follow-Up Visit  Chief Complaint:  Follow Up  History of Present Illness:    [name] [name] is a 69 y.o. female for ongoing assessment and management of hypertension, hyperlipidemia, history of PAD followed by V [facility], chronic combined systolic diastolic heart failure, prior TIA.  History of smoking, chronic kidney disease stage III, diabetes mellitus type 2, COPD, OSA, and morbid obesity.  She had been admitted in June 2020 with decompensated CHF.  She did have a right heart catheterization at the time of that admission which revealed no significant volume overload.  She was last seen in the office on 08/04/2019 for preoperative evaluation to have back surgery along with a wrist surgery.  She was without complaint of shortness of breath or chest pain at that time but was very sedentary and sitting in a wheelchair.  She was seen in  the advanced heart failure clinic by Ellen Henri, [place].  Her note documents that she was recently admitted on 01/26/2020 for UTI sepsis treated with antibiotics and IV fluid resuscitation leading to volume overload.  She did require IV Lasix.    She was found to have worsening renal function.  Nephrology was consulted and recommended dialysis but the patient refused.  She was transitioned back to p.o. diuretics with high-dose Lasix with poor urinary response.  On follow-up with nephrology they changed her to torsemide 100 mg twice daily and metolazone 5 mg daily.  On that appointment with Ms. Simmons, [place], she remained very edematous but reported that torsemide was helping her with diuresis compared to the Lasix.  She states that she is continuing to have good diuresis with change to torsemide.  She states she is losing approximately 4 pounds a day.  She is feeling better and beginning to breathe some better.  She is medically compliant.  She denies any cramping chest pain or fatigue.  The patient does not  have symptoms concerning for COVID-19 infection (fever, chills, cough, or new shortness of breath).    Past Medical History:  Diagnosis Date  . Anemia   . Arthritis   . Asthma   . Bronchitis   . CHF (congestive heart failure) ([place])   . Chronic kidney disease   . Chronic kidney disease   . Chronic pain syndrome   . COPD (chronic obstructive pulmonary disease) ([place])   . Cramping of feet   . Diabetes mellitus   . Dyspnea   . GERD (gastroesophageal reflux disease)   .  Gout   . Headache   . History of hiatal hernia   . History of hip replacement, total   . History of transfusion   . Hx MRSA infection    abscess left groin  . Hx of cardiac cath 06/2014  . Hyperlipidemia   . Hypertension   . Hypothyroid   . 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 Morbidly obese ([place])   . Peripheral vascular disease ([place])   . Positive cardiac stress test 12/2013   anterior and lateral ischemia on  Myoview  . Sleep apnea    UNABLE TO TOLERATE C PAP  . Stress incontinence   . Thyroid disease   . TIA (transient ischemic attack)     X2 NO RESIDUAL PROBLEMS   Past Surgical History:  Procedure Laterality Date  . ABDOMINAL HYSTERECTOMY    . BACK SURGERY  2020  . COLON SURGERY  1995   DUE TO POLYP  . DILATION AND CURETTAGE OF UTERUS    . FOREARM FRACTURE SURGERY     Left arm  . HERNIA REPAIR     w/ mesh  . INCISION AND DRAINAGE ABSCESS Left 02/04/2013   Procedure: INCISION AND DRAINAGE LEFT BUTTOCK ABSCESS; INCISION AND DRAINAGE LEFT BREAST ABSCESS;  Surgeon: Harl Bowie, MD;  Location: [place];  Service: General;  Laterality: Left;  . INCISION AND DRAINAGE ABSCESS N/A 02/12/2013   Procedure: INCISION AND DEBRIDEMENT BUTTOCK WOUND ;  Surgeon: [name]. Georgette Dover, MD;  Location: [place];  Service: General;  Laterality: N/A;  . INCISION AND DRAINAGE ABSCESS N/A 02/14/2013   Procedure: INCISION AND DRAINAGE/DRESSING CHANGE;  Surgeon: Harl Bowie, MD;  Location: [place];  Service: General;  Laterality: N/A;  . INCISION AND DRAINAGE ABSCESS N/A 03/21/2015   Procedure: INCISION AND DRAINAGE PUBIC ABSCESS;  Surgeon: Excell Seltzer, MD;  Location: [facility];  Service: General;  Laterality: N/A;  . INCISION AND DRAINAGE PERIRECTAL ABSCESS Left 02/10/2013   Procedure: IRRIGATION AND DEBRIDEMENT OF BUTTOCK/PERINEAL ABSCESS;  Surgeon: [name]. Georgette Dover, MD;  Location: [place];  Service: General;  Laterality: Left;  . INCISION AND DRAINAGE PERIRECTAL ABSCESS N/A 02/16/2013   Procedure: IRRIGATION AND DEBRIDEMENT PERINEAL ABSCESS;  Surgeon: Zenovia Jarred, MD;  Location: [place];  Service: General;  Laterality: N/A;  . IRRIGATION AND DEBRIDEMENT ABSCESS N/A 02/06/2013   Procedure: IRRIGATION AND DEBRIDEMENT BUTTOCK ABSCESS AND DRESSING CHANGE;  Surgeon: Harl Bowie, MD;  Location: [facility];  Service: General;  Laterality: N/A;  . IRRIGATION AND DEBRIDEMENT ABSCESS Left 02/08/2013   Procedure: IRRIGATION  AND DEBRIDEMENT ABSCESS/DRESSING CHANGE;  Surgeon: Gwenyth Ober, MD;  Location: [place];  Service: General;  Laterality: Left;  . JOINT REPLACEMENT  2010 / 2012   . LAPAROSCOPIC CHOLECYSTECTOMY    . LEFT HEART CATHETERIZATION WITH CORONARY ANGIOGRAM N/A 07/02/2014   Procedure: LEFT HEART CATHETERIZATION WITH CORONARY ANGIOGRAM;  Surgeon: Lorretta Harp, MD;  Location: [facility] CATH LAB;  Service: Cardiovascular;  Laterality: N/A;  . LOWER EXTREMITY ANGIOGRAM Right 04/20/2016   Procedure: Lower Extremity Angiogram;  Surgeon: Elam Dutch, MD;  Location: [place] CV LAB;  Service: Cardiovascular;  Laterality: Right;  . PERIPHERAL VASCULAR CATHETERIZATION N/A 04/20/2016   Procedure: Abdominal Aortogram;  Surgeon: Elam Dutch, MD;  Location: [place] CV LAB;  Service: Cardiovascular;  Laterality: N/A;  . PERIPHERAL VASCULAR CATHETERIZATION Right 04/20/2016   Procedure: Peripheral Vascular Intervention;  Surgeon: Elam Dutch, MD;  Location: [place] CV LAB;  Service:  Cardiovascular;  Laterality: Right;  popiteal  . RIGHT HEART CATH N/A 10/27/2018   Procedure: RIGHT HEART CATH;  Surgeon: Larey Dresser, MD;  Location: [place] CV LAB;  Service: Cardiovascular;  Laterality: N/A;  . RIGHT HEART CATH N/A 03/20/2019   Procedure: RIGHT HEART CATH;  Surgeon: Larey Dresser, MD;  Location: [place] CV LAB;  Service: Cardiovascular;  Laterality: N/A;  . THYROIDECTOMY    . TOTAL HIP REVISION Right 08/11/2014   Procedure: RIGHT ACETABULAR REVISION;  Surgeon: Gearlean Alf, MD;  Location: [facility];  Service: Orthopedics;  Laterality: Right;  Marland Kitchen VASCULAR SURGERY       Current Meds  Medication Sig  . albuterol (PROAIR HFA) 108 (90 Base) MCG/ACT inhaler Inhale 2 puffs into the lungs 4 (four) times daily as needed for wheezing or shortness of breath.  Marland Kitchen albuterol (PROVENTIL) (2.5 MG/3ML) 0.083% nebulizer solution Take 2.5 mg by nebulization every 6 (six) hours as needed for wheezing or  shortness of breath.  . allopurinol (ZYLOPRIM) 100 MG tablet Take 100 mg by mouth every morning.   Marland Kitchen amLODipine (NORVASC) 5 MG tablet Take 1 tablet (5 mg total) by mouth daily.  . B Complex Vitamins (VITAMIN B COMPLEX PO) Take 1 tablet by mouth 2 (two) times daily.   . budesonide-formoterol (SYMBICORT) 160-4.5 MCG/ACT inhaler Inhale 2 puffs into the lungs 2 (two) times daily as needed (for SOB).   . carvedilol (COREG) 12.5 MG tablet Take 12.5 mg by mouth 2 (two) times daily.  . cetirizine (ZYRTEC) 10 MG tablet Take 10 mg by mouth daily.   . Cholecalciferol (VITAMIN D) 50 MCG (2000 UT) CAPS Take 2,000 Units by mouth daily.  . cinacalcet (SENSIPAR) 30 MG tablet Take 30 mg by mouth daily.  . clopidogrel (PLAVIX) 75 MG tablet TAKE 1 TABLET BY MOUTH EVERY DAY (Patient taking differently: Take 75 mg by mouth daily. )  . Dulaglutide 1.5 MG/0.5ML SOPN Inject 1.5 mg into the skin every Monday.   . fenofibrate (TRICOR) 145 MG tablet TAKE 1 TABLET (145 MG TOTAL) BY MOUTH DAILY. (Patient taking differently: Take 145 mg by mouth daily. )  . fluticasone (FLONASE) 50 MCG/ACT nasal spray Place 2 sprays into the nose as needed for allergies.   . hydrOXYzine (ATARAX/VISTARIL) 25 MG tablet Take 25 mg by mouth 2 (two) times daily.   . insulin lispro protamine-lispro (HUMALOG 75/25 MIX) (75-25) 100 UNIT/ML SUSP injection Inject 35-40 Units into the skin See admin instructions. Inject 40 units into the skin morning and 35 units in the evening  . levothyroxine (SYNTHROID, LEVOTHROID) 150 MCG tablet Take 150 mcg by mouth daily before breakfast.  . magnesium oxide (MAG-OX) 400 (241.3 Mg) MG tablet Take 1 tablet (400 mg total) by mouth 2 (two) times daily.  . metolazone (ZAROXOLYN) 5 MG tablet Take 5 mg by mouth daily.  . mirabegron ER (MYRBETRIQ) 50 MG TB24 tablet Take 50 mg by mouth daily.  . Multiple Vitamin (MULTIVITAMIN WITH MINERALS) TABS tablet Take 1 tablet by mouth daily.  Marland Kitchen oxyCODONE-acetaminophen (PERCOCET)  10-325 MG tablet Take 1 tablet by mouth every 4 (four) hours as needed for pain.  . pantoprazole (PROTONIX) 40 MG tablet Take 1 tablet (40 mg total) by mouth daily.  . polyvinyl alcohol (LIQUIFILM TEARS) 1.4 % ophthalmic solution Place 2 drops into both eyes daily as needed for dry eyes.  . simvastatin (ZOCOR) 20 MG tablet Take 20 mg by mouth at bedtime.  . sodium zirconium cyclosilicate (LOKELMA) 10  g PACK packet Take 10 g by mouth daily.  Marland Kitchen tiZANidine (ZANAFLEX) 4 MG tablet Take 4 mg by mouth 2 (two) times daily.   Marland Kitchen torsemide (DEMADEX) 100 MG tablet Take 100 mg by mouth 2 (two) times daily.     Allergies:   Nebivolol, Ace inhibitors, and Morphine and related   Social History   Tobacco Use  . Smoking status: Current Some Day Smoker    Packs/day: 0.25    Years: 40.00    Pack years: 10.00    Types: Cigarettes  . Smokeless tobacco: Never Used  Substance Use Topics  . Alcohol use: Yes    Alcohol/week: 1.0 standard drinks    Types: 1 Standard drinks or equivalent per week    Comment: OCC  . Drug use: No    Comment: Quit four years ago.     Family Hx: The patient's family history includes Cancer in her father; Cardiomyopathy in her mother; Diabetes in her brother; Heart disease in her mother.  ROS:   Please see the history of present illness.    All other systems reviewed and are negative.   Prior CV studies:   The following studies were reviewed today: Echocardiogram 03/18/2019 1. The left ventricle has mild-moderately reduced systolic function, with  an ejection fraction of 40-45%. The cavity size was normal. Left  ventricular diastolic Doppler parameters are consistent with impaired  relaxation.  2. Left atrial size was not well visualized.  3. The aortic root and ascending aorta are normal in size and structure.  4. The interatrial septum was not well visualized.   Labs/Other Tests and Data Reviewed:    EKG:  No ECG reviewed.  Recent Labs: 03/16/2019: TSH  0.599 04/08/2019: B Natriuretic Peptide 294.4 01/10/2020: ALT 17 01/11/2020: Magnesium 2.4 01/13/2020: Platelets 458 01/29/2020: Hemoglobin 8.5 02/02/2020: BUN 76; Creatinine, Ser 3.21; Potassium 3.6; Sodium 142   Recent Lipid Panel Lab Results  Component Value Date/Time   CHOL 130 03/22/2015 02:59 AM   CHOL 188 12/23/2013 11:32 AM   TRIG 144 03/22/2015 02:59 AM   HDL 27 (L) 03/22/2015 02:59 AM   HDL 48 12/23/2013 11:32 AM   CHOLHDL 4.8 03/22/2015 02:59 AM   LDLCALC 74 03/22/2015 02:59 AM   LDLCALC 119 (H) 12/23/2013 11:32 AM    Wt Readings from Last 3 Encounters:  02/03/20 265 lb (120.2 kg)  02/02/20 268 lb 9.6 oz (121.8 kg)  01/06/20 250 lb (113.4 kg)     Objective:    Vital Signs:  BP 134/78   Ht 5\' 3"  (1.6 m)   Wt 265 lb (120.2 kg)   BMI 46.94 kg/m    Limited due to virtual visit.   ASSESSMENT & PLAN:    1.  Chronic combined diastolic and systolic CHF.  Is being followed by nephrology and advanced heart failure clinic.  She has been changed to torsemide from furosemide and is having significant improvement in her fluid and diuresis.  We will not make any changes at this time.  Labs are being followed by nephrology.  She is tolerating all medications as prescribed and is medically compliant.  2.  PAD: Followed by [facility].  She denies any significant lower extremity pain at this time.  3.  Chronic kidney disease stage III.  Followed by nephrology with frequent labs she currently refuses dialysis.  4.  OSA: Followed by PCP.  COVID-19 Education: The signs and symptoms of COVID-19 were discussed with the patient and how to seek care for testing (  follow up with PCP or arrange E-visit).  The importance of social distancing was discussed today.  Time:   Today, I have spent 25 minutes with the patient with telehealth technology discussing the above problems.     Medication Adjustments/Labs and Tests Ordered: Current medicines are reviewed at length with the patient today.   Concerns regarding medicines are outlined above.   Tests Ordered: No orders of the defined types were placed in this encounter.   Medication Changes: No orders of the defined types were placed in this encounter.   Disposition:  Follow up follow up 6 months   Signed, Phill Myron. West Pugh, ANP, [facility]  02/03/2020 11:26 AM    [facility]

## 2020-02-03 ENCOUNTER — Telehealth (INDEPENDENT_AMBULATORY_CARE_PROVIDER_SITE_OTHER): Payer: Medicare Other | Admitting: Adult Health

## 2020-02-03 ENCOUNTER — Encounter: Payer: Self-pay | Admitting: Adult Health

## 2020-02-03 VITALS — BP 134/78 | Ht 63.0 in | Wt 265.0 lb

## 2020-02-03 DIAGNOSIS — G4733 Obstructive sleep apnea (adult) (pediatric): Secondary | ICD-10-CM

## 2020-02-03 DIAGNOSIS — I5042 Chronic combined systolic (congestive) and diastolic (congestive) heart failure: Secondary | ICD-10-CM | POA: Diagnosis not present

## 2020-02-03 DIAGNOSIS — I739 Peripheral vascular disease, unspecified: Secondary | ICD-10-CM

## 2020-02-03 DIAGNOSIS — N1832 Chronic kidney disease, stage 3b: Secondary | ICD-10-CM

## 2020-02-03 DIAGNOSIS — I10 Essential (primary) hypertension: Secondary | ICD-10-CM | POA: Diagnosis not present

## 2020-02-03 NOTE — Patient Instructions (Signed)
Medication Instructions:  Continue current medications  *If you need a refill on your cardiac medications before your next appointment, please call your pharmacy*   Lab Work: None Ordered   Testing/Procedures: None Ordered   Follow-Up: At [facility], you and your health needs are our priority.  As part of our continuing mission to provide you with exceptional heart care, we have created designated Provider Care Teams.  These Care Teams include your primary Cardiologist (physician) and Advanced Practice Providers (APPs -  Physician Assistants and Nurse Practitioners) who all work together to provide you with the care you need, when you need it.  We recommend signing up for the patient portal called "MyChart".  Sign up information is provided on this After Visit Summary.  MyChart is used to connect with patients for Virtual Visits (Telemedicine).  Patients are able to view lab/test results, encounter notes, upcoming appointments, etc.  Non-urgent messages can be sent to your provider as well.   To learn more about what you can do with MyChart, go to [email or web address]://[email or web address].com.    Your next appointment:   6 month(s)  The format for your next appointment:   In Person  Provider:   You may see Jonathan Berry, MD or one of the following Advanced Practice Providers on your designated Care Team:    Luke Kilroy, PA-C  Callie Goodrich, PA-C  Jesse Cleaver, FNP      

## 2020-02-26 ENCOUNTER — Ambulatory Visit (HOSPITAL_COMMUNITY)
Admission: RE | Admit: 2020-02-26 | Discharge: 2020-02-26 | Disposition: A | Discharge status: Home / Self Care | Payer: Medicare Other | Source: Ambulatory Visit | Attending: Internal Medicine | Admitting: Internal Medicine

## 2020-02-26 ENCOUNTER — Other Ambulatory Visit: Payer: Self-pay

## 2020-02-26 VITALS — BP 150/76 | HR 77 | Temp 95.6°F | Resp 18

## 2020-02-26 DIAGNOSIS — N179 Acute kidney failure, unspecified: Secondary | ICD-10-CM | POA: Diagnosis present

## 2020-02-26 DIAGNOSIS — N184 Chronic kidney disease, stage 4 (severe): Secondary | ICD-10-CM | POA: Diagnosis present

## 2020-02-26 LAB — RENAL FUNCTION PANEL
Albumin: 3.5 g/dL (ref 3.5–5.0)
Anion gap: 15 (ref 5–15)
BUN: 153 mg/dL — ABNORMAL HIGH (ref 8–23)
CO2: 35 mmol/L — ABNORMAL HIGH (ref 22–32)
Calcium: 9.9 mg/dL (ref 8.9–10.3)
Chloride: 85 mmol/L — ABNORMAL LOW (ref 98–111)
Creatinine, Ser: 3.24 mg/dL — ABNORMAL HIGH (ref 0.44–1.00)
GFR calc Af Amer: 16 mL/min — ABNORMAL LOW (ref >60)
GFR calc non Af Amer: 14 mL/min — ABNORMAL LOW (ref >60)
Glucose, Bld: 131 mg/dL — ABNORMAL HIGH (ref 70–99)
Phosphorus: 3.9 mg/dL (ref 2.5–4.6)
Potassium: 3 mmol/L — ABNORMAL LOW (ref 3.5–5.1)
Sodium: 135 mmol/L (ref 135–145)

## 2020-02-26 LAB — POCT HEMOGLOBIN-HEMACUE: Hemoglobin: 9.3 g/dL — ABNORMAL LOW (ref 12.0–15.0)

## 2020-02-26 LAB — FERRITIN: Ferritin: 57 ng/mL (ref 11–307)

## 2020-02-26 LAB — IRON AND TIBC
Iron: 65 ug/dL (ref 28–170)
Saturation Ratios: 11 % (ref 10.4–31.8)
TIBC: 587 ug/dL — ABNORMAL HIGH (ref 250–450)
UIBC: 522 ug/dL

## 2020-02-26 MED ORDER — EPOETIN ALFA-EPBX 40000 UNIT/ML IJ SOLN
INTRAMUSCULAR | Status: AC
  Filled 2020-02-26: qty 1

## 2020-02-26 MED ORDER — EPOETIN ALFA-EPBX 40000 UNIT/ML IJ SOLN
40000.0000 [IU] | INTRAMUSCULAR | Status: DC
  Administered 2020-02-26: 40000 [IU] via SUBCUTANEOUS

## 2020-02-27 LAB — PTH, INTACT AND CALCIUM
Calcium, Total (PTH): 10.3 mg/dL (ref 8.7–10.3)
PTH: 37 pg/mL (ref 15–65)

## 2020-03-15 ENCOUNTER — Other Ambulatory Visit: Payer: Self-pay

## 2020-03-15 ENCOUNTER — Ambulatory Visit (HOSPITAL_COMMUNITY)
Admission: RE | Admit: 2020-03-15 | Discharge: 2020-03-15 | Disposition: A | Discharge status: Home / Self Care | Payer: Medicare Other | Source: Ambulatory Visit | Attending: Cardiology | Admitting: Cardiology

## 2020-03-15 ENCOUNTER — Encounter (HOSPITAL_COMMUNITY): Payer: Self-pay

## 2020-03-15 VITALS — BP 208/102 | HR 88 | Wt 243.6 lb

## 2020-03-15 DIAGNOSIS — Z79890 Hormone replacement therapy (postmenopausal): Secondary | ICD-10-CM | POA: Insufficient documentation

## 2020-03-15 DIAGNOSIS — Z794 Long term (current) use of insulin: Secondary | ICD-10-CM | POA: Diagnosis not present

## 2020-03-15 DIAGNOSIS — Z8249 Family history of ischemic heart disease and other diseases of the circulatory system: Secondary | ICD-10-CM | POA: Insufficient documentation

## 2020-03-15 DIAGNOSIS — Z8614 Personal history of Methicillin resistant Staphylococcus aureus infection: Secondary | ICD-10-CM | POA: Insufficient documentation

## 2020-03-15 DIAGNOSIS — F17210 Nicotine dependence, cigarettes, uncomplicated: Secondary | ICD-10-CM | POA: Insufficient documentation

## 2020-03-15 DIAGNOSIS — I132 Hypertensive heart and chronic kidney disease with heart failure and with stage 5 chronic kidney disease, or end stage renal disease: Secondary | ICD-10-CM | POA: Insufficient documentation

## 2020-03-15 DIAGNOSIS — Z7902 Long term (current) use of antithrombotics/antiplatelets: Secondary | ICD-10-CM | POA: Diagnosis not present

## 2020-03-15 DIAGNOSIS — Z7951 Long term (current) use of inhaled steroids: Secondary | ICD-10-CM | POA: Diagnosis not present

## 2020-03-15 DIAGNOSIS — I770 Arteriovenous fistula, acquired: Secondary | ICD-10-CM | POA: Diagnosis not present

## 2020-03-15 DIAGNOSIS — Z6841 Body Mass Index (BMI) 40.0 and over, adult: Secondary | ICD-10-CM | POA: Diagnosis not present

## 2020-03-15 DIAGNOSIS — N185 Chronic kidney disease, stage 5: Secondary | ICD-10-CM | POA: Insufficient documentation

## 2020-03-15 DIAGNOSIS — J449 Chronic obstructive pulmonary disease, unspecified: Secondary | ICD-10-CM | POA: Insufficient documentation

## 2020-03-15 DIAGNOSIS — Z79899 Other long term (current) drug therapy: Secondary | ICD-10-CM | POA: Diagnosis not present

## 2020-03-15 DIAGNOSIS — Z885 Allergy status to narcotic agent status: Secondary | ICD-10-CM | POA: Diagnosis not present

## 2020-03-15 DIAGNOSIS — I447 Left bundle-branch block, unspecified: Secondary | ICD-10-CM | POA: Diagnosis not present

## 2020-03-15 DIAGNOSIS — Z8673 Personal history of transient ischemic attack (TIA), and cerebral infarction without residual deficits: Secondary | ICD-10-CM | POA: Diagnosis not present

## 2020-03-15 DIAGNOSIS — E1122 Type 2 diabetes mellitus with diabetic chronic kidney disease: Secondary | ICD-10-CM | POA: Insufficient documentation

## 2020-03-15 DIAGNOSIS — E039 Hypothyroidism, unspecified: Secondary | ICD-10-CM | POA: Insufficient documentation

## 2020-03-15 DIAGNOSIS — I5022 Chronic systolic (congestive) heart failure: Secondary | ICD-10-CM | POA: Diagnosis not present

## 2020-03-15 DIAGNOSIS — G4730 Sleep apnea, unspecified: Secondary | ICD-10-CM | POA: Diagnosis not present

## 2020-03-15 DIAGNOSIS — I5042 Chronic combined systolic (congestive) and diastolic (congestive) heart failure: Secondary | ICD-10-CM | POA: Diagnosis not present

## 2020-03-15 DIAGNOSIS — E785 Hyperlipidemia, unspecified: Secondary | ICD-10-CM | POA: Insufficient documentation

## 2020-03-15 DIAGNOSIS — Z833 Family history of diabetes mellitus: Secondary | ICD-10-CM | POA: Insufficient documentation

## 2020-03-15 DIAGNOSIS — M109 Gout, unspecified: Secondary | ICD-10-CM | POA: Diagnosis not present

## 2020-03-15 NOTE — Progress Notes (Signed)
Advanced Heart Failure Clinic Note   Referring Physician: PCP: [name], MD PCP-Cardiologist: Dr. Aundra Dubin   HPI:  [name] [name] a 69 y.o.femalewith a history of TIAs, tobacco use, HTN, DM, HL, PAD followed by Dr Oneida Alar, chronic diastolic HF, CKD IV-V followed by [facility], morbid obesity and LBBB.  Admitted 10/20/2018 with volume overload despite high dose diuretics at home (lasix 160 bid + metolazone 5 mg 3x/week). Prior to admit she ran out of metolazone. Hospital course was complicated by AKI on CKD. Diuresed with IV lasix and transitioned to torsemide 80 mg /40 mg.   Admitted 2/20-2/21/20 with SOB and chest pressure in the setting of running out of her diuretics for 3-4 days and with ? Syncopal episode. Work up was unremarkable, troponin flat. Meds resumed.   Admitted 03/2019 for a/c CHF required IV diuretics. Echo showed LVEF 40-45%. RV not well visualized. RHC after diuresis showed low filling pressures, no significant CHF.   Recently admitted 01/2020 for UTI sepsis (gram-negative/E. coli bacteremia), treated w/ abx + IVF resuscitation>>leading to volume overload. Required IV Lasix. Had worsening renal function. Per d/c note, Nephrology was consulted and recommended dialysis but pt refused. Transitioned back to PO diuretics, high dose lasix initially w/ poor urinary response. Had f/u with nephrology and they changed back to torsemide 100 mg bid. Also on high dose metolazone 5 mg daily.   She had post hospital f/u visit, here in the [facility] 4/27 and remained very edematous but reported improved urinary response on torsemide and gradual wt loss. She denied any uremic symptoms. BMP showed stable SCr c/w baseline and normal K. Respiratory status was stable. She was hypertensive and amlodipine was added to regimen.  She presents back for f/u. Here w/ her son. Her BP is elevated but she reports BP has been much better at home. She checked her BP prior to  appointment and was 157/60. She is very frustrated w/ her care here and wants to transition back to Dr. Gwenlyn Found. Has refused to take meds that have been prescribed to her. Did not take amlodipine. Intolerant to Bidil. Reports that she feels terrible when she takes meds and is adamant that she does not want to add to regimen. She denies headaches. No dizziness, lightheaded, CP and no dyspnea. Reports compliance w/ torsemide and Coreg. Tolerating well w/o side effects. Notes good UOP still w/ torsemide and metolazone. Reports that she just had f/u with nephrology and renal function was stable. She has been referred to [facility] for [facility] fistula for HD access, but she was told that she could possible not need HD for another 6-12 months.    Past Medical History:  Diagnosis Date  . Anemia   . Arthritis   . Asthma   . Bronchitis   . CHF (congestive heart failure) ([place])   . Chronic kidney disease   . Chronic kidney disease   . Chronic pain syndrome   . COPD (chronic obstructive pulmonary disease) ([place])   . Cramping of feet   . Diabetes mellitus   . Dyspnea   . GERD (gastroesophageal reflux disease)   . Gout   . Headache   . History of hiatal hernia   . History of hip replacement, total   . History of transfusion   . Hx MRSA infection    abscess left groin  . Hx of cardiac cath 06/2014  . Hyperlipidemia   . Hypertension   . Hypothyroid   . 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  Morbidly obese ([place])   . Peripheral vascular disease ([place])   . Positive cardiac stress test 12/2013   anterior and lateral ischemia on Myoview  . Sleep apnea    UNABLE TO TOLERATE C PAP  . Stress incontinence   . Thyroid disease   . TIA (transient ischemic attack)     X2 NO RESIDUAL PROBLEMS    Current Outpatient Medications  Medication Sig Dispense Refill  . albuterol (PROAIR HFA) 108 (90 Base) MCG/ACT inhaler Inhale 2 puffs into the lungs 4 (four) times daily as needed for wheezing or shortness of breath.    Marland Kitchen  albuterol (PROVENTIL) (2.5 MG/3ML) 0.083% nebulizer solution Take 2.5 mg by nebulization every 6 (six) hours as needed for wheezing or shortness of breath.    . allopurinol (ZYLOPRIM) 100 MG tablet Take 100 mg by mouth every morning.     . B Complex Vitamins (VITAMIN B COMPLEX PO) Take 1 tablet by mouth 2 (two) times daily.     . budesonide-formoterol (SYMBICORT) 160-4.5 MCG/ACT inhaler Inhale 2 puffs into the lungs 2 (two) times daily as needed (for SOB).     . carvedilol (COREG) 12.5 MG tablet Take 12.5 mg by mouth 2 (two) times daily.    . cetirizine (ZYRTEC) 10 MG tablet Take 10 mg by mouth daily.     . Cholecalciferol (VITAMIN D) 50 MCG (2000 UT) CAPS Take 2,000 Units by mouth daily.    . cinacalcet (SENSIPAR) 30 MG tablet Take 30 mg by mouth daily.    . clopidogrel (PLAVIX) 75 MG tablet TAKE 1 TABLET BY MOUTH EVERY DAY (Patient taking differently: Take 75 mg by mouth daily. ) 30 tablet 0  . Dulaglutide 1.5 MG/0.5ML SOPN Inject 1.5 mg into the skin every Monday.     . fenofibrate (TRICOR) 145 MG tablet TAKE 1 TABLET (145 MG TOTAL) BY MOUTH DAILY. (Patient taking differently: Take 145 mg by mouth daily. ) 90 tablet 2  . fluticasone (FLONASE) 50 MCG/ACT nasal spray Place 2 sprays into the nose as needed for allergies.     . hydrOXYzine (ATARAX/VISTARIL) 25 MG tablet Take 25 mg by mouth 2 (two) times daily.     . insulin lispro protamine-lispro (HUMALOG 75/25 MIX) (75-25) 100 UNIT/ML SUSP injection Inject 35-40 Units into the skin See admin instructions. Inject 40 units into the skin morning and 35 units in the evening    . levothyroxine (SYNTHROID, LEVOTHROID) 150 MCG tablet Take 150 mcg by mouth daily before breakfast.    . magnesium oxide (MAG-OX) 400 (241.3 Mg) MG tablet Take 1 tablet (400 mg total) by mouth 2 (two) times daily. 30 tablet 0  . metolazone (ZAROXOLYN) 5 MG tablet Take 5 mg by mouth daily.    . mirabegron ER (MYRBETRIQ) 50 MG TB24 tablet Take 50 mg by mouth daily.    . Multiple  Vitamin (MULTIVITAMIN WITH MINERALS) TABS tablet Take 1 tablet by mouth daily.    Marland Kitchen oxyCODONE-acetaminophen (PERCOCET) 10-325 MG tablet Take 1 tablet by mouth every 4 (four) hours as needed for pain. 40 tablet 0  . pantoprazole (PROTONIX) 40 MG tablet Take 1 tablet (40 mg total) by mouth daily. 30 tablet 1  . polyvinyl alcohol (LIQUIFILM TEARS) 1.4 % ophthalmic solution Place 2 drops into both eyes daily as needed for dry eyes.    . simvastatin (ZOCOR) 20 MG tablet Take 20 mg by mouth at bedtime.  1  . sodium zirconium cyclosilicate (LOKELMA) 10 g PACK packet Take 10 g by  mouth daily. 30 packet 0  . tiZANidine (ZANAFLEX) 4 MG tablet Take 4 mg by mouth 2 (two) times daily.     Marland Kitchen torsemide (DEMADEX) 100 MG tablet Take 100 mg by mouth 2 (two) times daily.     No current facility-administered medications for this encounter.    Allergies  Allergen Reactions  . Nebivolol Swelling    Chest pain  . Ace Inhibitors Swelling and Other (See Comments)    Tongue swell  . Morphine And Related Itching      Social History   Socioeconomic History  . Marital status: Legally Separated    Spouse name: Not on file  . Number of children: 1  . Years of education: college  . Highest education level: Not on file  Occupational History    Comment: Disabled  Tobacco Use  . Smoking status: Current Some Day Smoker    Packs/day: 0.25    Years: 40.00    Pack years: 10.00    Types: Cigarettes  . Smokeless tobacco: Never Used  Substance and Sexual Activity  . Alcohol use: Yes    Alcohol/week: 1.0 standard drinks    Types: 1 Standard drinks or equivalent per week    Comment: OCC  . Drug use: No    Comment: Quit four years ago.  Marland Kitchen Sexual activity: Not on file  Other Topics Concern  . Not on file  Social History Narrative   Patient is separated  and lives at home with her friend. Patient is disabled.   Education one year of college   Right handed   Caffeine two cups daily.      Social Determinants  of Health   Financial Resource Strain:   . Difficulty of Paying Living Expenses:   Food Insecurity:   . Worried About Charity fundraiser in the Last Year:   . Arboriculturist in the Last Year:   Transportation Needs:   . Film/video editor (Medical):   Marland Kitchen Lack of Transportation (Non-Medical):   Physical Activity:   . Days of Exercise per Week:   . Minutes of Exercise per Session:   Stress:   . Feeling of Stress :   Social Connections:   . Frequency of Communication with Friends and Family:   . Frequency of Social Gatherings with Friends and Family:   . Attends Religious Services:   . Active Member of Clubs or Organizations:   . Attends Archivist Meetings:   Marland Kitchen Marital Status:   Intimate Partner Violence:   . Fear of Current or Ex-Partner:   . Emotionally Abused:   Marland Kitchen Physically Abused:   . Sexually Abused:       Family History  Problem Relation Age of Onset  . Diabetes Brother   . Cardiomyopathy Mother   . Heart disease Mother   . Cancer Father     Vitals:   03/15/20 1226  BP: (!) 208/102  Pulse: 88  SpO2: 93%  Weight: 110.5 kg (243 lb 9.6 oz)     PHYSICAL EXAM: General:  chronically ill appearing super morbidly obese female, presenting in wheel chair. No resting dyspnea.   HEENT: normal Neck: supple. Thick neck,  JVD assessment difficult. Carotids 2+ bilat; no bruits. No lymphadenopathy or thyromegaly appreciated. Cor: PMI nondisplaced. Regular rate & rhythm. No rubs, gallops or murmurs. Lungs: clear, no wheezing  Abdomen: obese but soft, nontender, nondistended. No hepatosplenomegaly. No bruits or masses. Good bowel sounds. Extremities: no cyanosis, clubbing, rash, 2+  pedal edema Neuro: alert & oriented x 3, cranial nerves grossly intact. moves all 4 extremities w/o difficulty. Affect pleasant.   ASSESSMENT & PLAN:   1. Chronic Systolic CHF: Echo in 1/60 with EF 45-50%. Echo 03/2019 with EF 40-45%, RV not visualized. [place] 03/2019 showed low  filling pressures, no significant CHF.  - she is very edematous and volume control c/b worsening renal function, now Stage IV-V CKD. She is followed closely by [facility] and nearing HD but continues on high dose diuretics and still making urine. Her diuretics at this point are managed by nephrology. Recently taken off lasix and changed back to  torsemide 100 mg bid and metolazone 5 mg 3 days a week. She states she is diuresing and her wt has been drifting down slowly since med change.  - She is chronically NYHA III, but also in the setting of super morbid obesity and deconditioning Body mass index is 43.15 kg/m. She denies resting dyspnea.  - Nephrology will continue to manage her diuretics but suspect she is nearing HD. She has been referred to [facility] for [facility] fistual.  - GDMT limited by CKD. BP is elevated and needs better control but she refusses additional meds/ med titration.  - She did not tolerate Bidil due to dizziness, visual changes and head aches. Refuses to try amlodipine.  - Continue Coreg 6.25 mg bid.   - no SGLT2i w/ low GFR   2. Stage IV-V CKD - management per nephrology per above  - continues to make urine. No uremic symptoms  - diuretics managed by nephrology. Was just seen and had BMP. Will request copy of notes/labs.   3. HTN: elevated in clinic, but she reports home BP was much lower in the 737T systolic today  - therapy limited by CKD, intolerant to Bidil. Refused amlodipine.  Adamant that she will not start any additional meds or titrate current regimen.  - Continue coreg at current dose - referring back to general cardiology for ongoing management   4. HLD - continue statin - managed by PCP  5. DM - management per PCP  6. Obesity  - Body mass index is 43.15 kg/m.    Pt no longer wants to be followed in [facility]. Prefers to continue cardiac care w/ Dr. Gwenlyn Found. Nephrology managing diuretics. If she decides to return, we would be happy to see her.   Lyda Jester, PA-C 03/15/20

## 2020-03-15 NOTE — Patient Instructions (Signed)
Be sure to contact  Dr Gwenlyn Found for a follow up. [place]

## 2020-03-17 ENCOUNTER — Other Ambulatory Visit (HOSPITAL_COMMUNITY): Payer: Self-pay

## 2020-03-18 ENCOUNTER — Encounter (HOSPITAL_COMMUNITY): Payer: Medicare Other

## 2020-03-22 ENCOUNTER — Other Ambulatory Visit: Payer: Self-pay | Admitting: *Deleted

## 2020-03-22 DIAGNOSIS — I739 Peripheral vascular disease, unspecified: Secondary | ICD-10-CM

## 2020-03-22 DIAGNOSIS — N184 Chronic kidney disease, stage 4 (severe): Secondary | ICD-10-CM

## 2020-03-25 ENCOUNTER — Other Ambulatory Visit: Payer: Self-pay

## 2020-03-25 ENCOUNTER — Ambulatory Visit (HOSPITAL_COMMUNITY)
Admission: RE | Admit: 2020-03-25 | Discharge: 2020-03-25 | Disposition: A | Discharge status: Home / Self Care | Payer: Medicare Other | Source: Ambulatory Visit | Attending: Internal Medicine | Admitting: Internal Medicine

## 2020-03-25 ENCOUNTER — Encounter (HOSPITAL_COMMUNITY)
Admission: RE | Admit: 2020-03-25 | Discharge: 2020-03-25 | Disposition: A | Discharge status: Home / Self Care | Payer: Medicare Other | Source: Ambulatory Visit | Attending: Internal Medicine | Admitting: Internal Medicine

## 2020-03-25 VITALS — BP 136/62 | HR 80 | Temp 97.6°F | Resp 18

## 2020-03-25 DIAGNOSIS — D631 Anemia in chronic kidney disease: Secondary | ICD-10-CM | POA: Insufficient documentation

## 2020-03-25 DIAGNOSIS — N184 Chronic kidney disease, stage 4 (severe): Secondary | ICD-10-CM | POA: Diagnosis present

## 2020-03-25 DIAGNOSIS — N179 Acute kidney failure, unspecified: Secondary | ICD-10-CM

## 2020-03-25 DIAGNOSIS — N189 Chronic kidney disease, unspecified: Secondary | ICD-10-CM | POA: Insufficient documentation

## 2020-03-25 LAB — IRON AND TIBC
Iron: 33 ug/dL (ref 28–170)
Saturation Ratios: 6 % — ABNORMAL LOW (ref 10.4–31.8)
TIBC: 515 ug/dL — ABNORMAL HIGH (ref 250–450)
UIBC: 482 ug/dL

## 2020-03-25 LAB — FERRITIN: Ferritin: 26 ng/mL (ref 11–307)

## 2020-03-25 LAB — RENAL FUNCTION PANEL
Albumin: 3.5 g/dL (ref 3.5–5.0)
Anion gap: 15 (ref 5–15)
BUN: 158 mg/dL — ABNORMAL HIGH (ref 8–23)
CO2: 32 mmol/L (ref 22–32)
Calcium: 10.1 mg/dL (ref 8.9–10.3)
Chloride: 88 mmol/L — ABNORMAL LOW (ref 98–111)
Creatinine, Ser: 3.07 mg/dL — ABNORMAL HIGH (ref 0.44–1.00)
GFR calc Af Amer: 17 mL/min — ABNORMAL LOW (ref >60)
GFR calc non Af Amer: 15 mL/min — ABNORMAL LOW (ref >60)
Glucose, Bld: 208 mg/dL — ABNORMAL HIGH (ref 70–99)
Phosphorus: 4.6 mg/dL (ref 2.5–4.6)
Potassium: 3 mmol/L — ABNORMAL LOW (ref 3.5–5.1)
Sodium: 135 mmol/L (ref 135–145)

## 2020-03-25 LAB — POCT HEMOGLOBIN-HEMACUE: Hemoglobin: 9.8 g/dL — ABNORMAL LOW (ref 12.0–15.0)

## 2020-03-25 MED ORDER — SODIUM CHLORIDE 0.9 % IV SOLN
510.0000 mg | INTRAVENOUS | Status: DC
  Administered 2020-03-25: 510 mg via INTRAVENOUS
  Filled 2020-03-25: qty 17

## 2020-03-25 MED ORDER — EPOETIN ALFA-EPBX 40000 UNIT/ML IJ SOLN: 40000.0000 [IU] | INTRAMUSCULAR | Status: DC

## 2020-03-25 MED ORDER — EPOETIN ALFA-EPBX 40000 UNIT/ML IJ SOLN
INTRAMUSCULAR | Status: AC
  Administered 2020-03-25: 40000 [IU] via SUBCUTANEOUS
  Filled 2020-03-25: qty 1

## 2020-03-26 LAB — PTH, INTACT AND CALCIUM
Calcium, Total (PTH): 10.6 mg/dL — ABNORMAL HIGH (ref 8.7–10.3)
PTH: 66 pg/mL — ABNORMAL HIGH (ref 15–65)

## 2020-03-31 ENCOUNTER — Ambulatory Visit (INDEPENDENT_AMBULATORY_CARE_PROVIDER_SITE_OTHER)
Admission: RE | Admit: 2020-03-31 | Discharge: 2020-03-31 | Disposition: A | Discharge status: Home / Self Care | Payer: Medicare Other | Source: Ambulatory Visit | Attending: Surgery | Admitting: Surgery

## 2020-03-31 ENCOUNTER — Encounter: Payer: Self-pay | Admitting: Vascular Surgery

## 2020-03-31 ENCOUNTER — Ambulatory Visit (INDEPENDENT_AMBULATORY_CARE_PROVIDER_SITE_OTHER): Payer: Medicare Other | Admitting: Vascular Surgery

## 2020-03-31 ENCOUNTER — Ambulatory Visit (HOSPITAL_COMMUNITY)
Admission: RE | Admit: 2020-03-31 | Discharge: 2020-03-31 | Disposition: A | Discharge status: Home / Self Care | Payer: Medicare Other | Source: Ambulatory Visit | Attending: Surgery | Admitting: Surgery

## 2020-03-31 ENCOUNTER — Other Ambulatory Visit: Payer: Self-pay

## 2020-03-31 ENCOUNTER — Encounter (HOSPITAL_COMMUNITY): Payer: Self-pay

## 2020-03-31 VITALS — BP 136/73 | HR 73 | Temp 97.5°F | Resp 20 | Ht 63.0 in | Wt 246.0 lb

## 2020-03-31 DIAGNOSIS — N184 Chronic kidney disease, stage 4 (severe): Secondary | ICD-10-CM | POA: Insufficient documentation

## 2020-03-31 DIAGNOSIS — I739 Peripheral vascular disease, unspecified: Secondary | ICD-10-CM | POA: Insufficient documentation

## 2020-03-31 NOTE — Progress Notes (Signed)
Referring Physician: [name]  Patient name: [name] [name] MRN: [id number] DOB: Sep 23, 1951 Sex: female  REASON FOR CONSULT: 1.  Follow-up peripheral arterial disease 2.  Hemodialysis access  HPI: [name] [name] is a 69 y.o. female, with a known long history of peripheral arterial disease.  She had a right superficial femoral artery stent placed by me in 2017.  She currently has no claudication symptoms.  However she does not walk very much due to severe degenerative joint disease.  She is considering a hip replacement currently.  She has no rest pain.  She has no nonhealing wounds.  She is additionally sent for evaluation today for placement of hemodialysis access.  She is right-handed.  She has had no prior access procedures.  She currently is not on hemodialysis.  She is CKD4.    Other medical problems include degenerative arthritis asthma congestive failure COPD diabetes all of which are currently stable.  Past Medical History:  Diagnosis Date  . Anemia   . Arthritis   . Asthma   . Bronchitis   . CHF (congestive heart failure) ([place])   . Chronic kidney disease   . Chronic kidney disease   . Chronic pain syndrome   . COPD (chronic obstructive pulmonary disease) ([place])   . Cramping of feet   . Diabetes mellitus   . Dyspnea   . GERD (gastroesophageal reflux disease)   . Gout   . Headache   . History of hiatal hernia   . History of hip replacement, total   . History of transfusion   . Hx MRSA infection    abscess left groin  . Hx of cardiac cath 06/2014  . Hyperlipidemia   . Hypertension   . Hypothyroid   . 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 Morbidly obese ([place])   . Peripheral vascular disease ([place])   . Positive cardiac stress test 12/2013   anterior and lateral ischemia on Myoview  . Sleep apnea    UNABLE TO TOLERATE C PAP  . Stress incontinence   . Thyroid disease   . TIA (transient ischemic attack)     X2 NO RESIDUAL PROBLEMS   Past Surgical History:    Procedure Laterality Date  . ABDOMINAL HYSTERECTOMY    . BACK SURGERY  2020  . COLON SURGERY  1995   DUE TO POLYP  . DILATION AND CURETTAGE OF UTERUS    . FOREARM FRACTURE SURGERY     Left arm  . HERNIA REPAIR     w/ mesh  . INCISION AND DRAINAGE ABSCESS Left 02/04/2013   Procedure: INCISION AND DRAINAGE LEFT BUTTOCK ABSCESS; INCISION AND DRAINAGE LEFT BREAST ABSCESS;  Surgeon: Harl Bowie, MD;  Location: [facility];  Service: General;  Laterality: Left;  . INCISION AND DRAINAGE ABSCESS N/A 02/12/2013   Procedure: INCISION AND DEBRIDEMENT BUTTOCK WOUND ;  Surgeon: [name]. Georgette Dover, MD;  Location: [facility];  Service: General;  Laterality: N/A;  . INCISION AND DRAINAGE ABSCESS N/A 02/14/2013   Procedure: INCISION AND DRAINAGE/DRESSING CHANGE;  Surgeon: Harl Bowie, MD;  Location: [facility];  Service: General;  Laterality: N/A;  . INCISION AND DRAINAGE ABSCESS N/A 03/21/2015   Procedure: INCISION AND DRAINAGE PUBIC ABSCESS;  Surgeon: Excell Seltzer, MD;  Location: [facility];  Service: General;  Laterality: N/A;  . INCISION AND DRAINAGE PERIRECTAL ABSCESS Left 02/10/2013   Procedure: IRRIGATION AND DEBRIDEMENT OF BUTTOCK/PERINEAL ABSCESS;  Surgeon: [name]. Georgette Dover, MD;  Location: [facility];  Service: General;  Laterality: Left;  . INCISION AND DRAINAGE PERIRECTAL ABSCESS N/A 02/16/2013   Procedure: IRRIGATION AND DEBRIDEMENT PERINEAL ABSCESS;  Surgeon: Zenovia Jarred, MD;  Location: [facility];  Service: General;  Laterality: N/A;  . IRRIGATION AND DEBRIDEMENT ABSCESS N/A 02/06/2013   Procedure: IRRIGATION AND DEBRIDEMENT BUTTOCK ABSCESS AND DRESSING CHANGE;  Surgeon: Harl Bowie, MD;  Location: [facility];  Service: General;  Laterality: N/A;  . IRRIGATION AND DEBRIDEMENT ABSCESS Left 02/08/2013   Procedure: IRRIGATION AND DEBRIDEMENT ABSCESS/DRESSING CHANGE;  Surgeon: Gwenyth Ober, MD;  Location: [facility];  Service: General;  Laterality: Left;  . JOINT REPLACEMENT  2010 / 2012   . LAPAROSCOPIC  CHOLECYSTECTOMY    . LEFT HEART CATHETERIZATION WITH CORONARY ANGIOGRAM N/A 07/02/2014   Procedure: LEFT HEART CATHETERIZATION WITH CORONARY ANGIOGRAM;  Surgeon: Lorretta Harp, MD;  Location: [facility] C[facility];  Service: Cardiovascular;  Laterality: N/A;  . LOWER EXTREMITY ANGIOGRAM Right 04/20/2016   Procedure: Lower Extremity Angiogram;  Surgeon: Elam Dutch, MD;  Location: [place] [facility] CV LAB;  Service: Cardiovascular;  Laterality: Right;  . PERIPHERAL VASCULAR CATHETERIZATION N/A 04/20/2016   Procedure: Abdominal Aortogram;  Surgeon: Elam Dutch, MD;  Location: [facility] CV LAB;  Service: Cardiovascular;  Laterality: N/A;  . PERIPHERAL VASCULAR CATHETERIZATION Right 04/20/2016   Procedure: Peripheral Vascular Intervention;  Surgeon: Elam Dutch, MD;  Location: [facility] [place] [facility] LAB;  Service: Cardiovascular;  Laterality: Right;  popiteal  . RIGHT HEART CATH N/A 10/27/2018   Procedure: RIGHT HEART CATH;  Surgeon: Larey Dresser, MD;  Location: [place] CV LAB;  Service: Cardiovascular;  Laterality: N/A;  . RIGHT HEART CATH N/A 03/20/2019   Procedure: RIGHT HEART CATH;  Surgeon: Larey Dresser, MD;  Location: [place] [facility];  Service: Cardiovascular;  Laterality: N/A;  . THYROIDECTOMY    . TOTAL HIP REVISION Right 08/11/2014   Procedure: RIGHT ACETABULAR REVISION;  Surgeon: Gearlean Alf, MD;  Location: [facility];  Service: Orthopedics;  Laterality: Right;  Marland Kitchen VASCULAR SURGERY      Family History  Problem Relation Age of Onset  . Diabetes Brother   . Cardiomyopathy Mother   . Heart disease Mother   . Cancer Father     SOCIAL HISTORY: Social History   Socioeconomic History  . Marital status: Legally Separated    Spouse name: Not on file  . Number of children: 1  . Years of education: college  . Highest education level: Not on file  Occupational History    Comment: Disabled  Tobacco Use  . Smoking status: Current Some Day Smoker    Packs/day: 0.25    Years: 40.00      Pack years: 10.00    Types: Cigarettes  . Smokeless tobacco: Never Used  Vaping Use  . Vaping Use: Never used  Substance and Sexual Activity  . Alcohol use: Yes    Alcohol/week: 1.0 standard drink    Types: 1 Standard drinks or equivalent per week    Comment: OCC  . Drug use: No    Comment: Quit four years ago.  Marland Kitchen Sexual activity: Not on file  Other Topics Concern  . Not on file  Social History Narrative   Patient is separated  and lives at home with her friend. Patient is disabled.   Education one year of college   Right handed   Caffeine two cups daily.      Social Determinants of Health   Financial Resource Strain:   .  Difficulty of Paying Living Expenses:   Food Insecurity:   . Worried About Charity fundraiser in the Last Year:   . Arboriculturist in the Last Year:   Transportation Needs:   . Film/video editor (Medical):   Marland Kitchen Lack of Transportation (Non-Medical):   Physical Activity:   . Days of Exercise per Week:   . Minutes of Exercise per Session:   Stress:   . Feeling of Stress :   Social Connections:   . Frequency of Communication with Friends and Family:   . Frequency of Social Gatherings with Friends and Family:   . Attends Religious Services:   . Active Member of Clubs or Organizations:   . Attends Archivist Meetings:   Marland Kitchen Marital Status:   Intimate Partner Violence:   . Fear of Current or Ex-Partner:   . Emotionally Abused:   Marland Kitchen Physically Abused:   . Sexually Abused:     Allergies  Allergen Reactions  . Nebivolol Swelling    Chest pain  . Ace Inhibitors Swelling and Other (See Comments)    Tongue swell  . Morphine And Related Itching    Current Outpatient Medications  Medication Sig Dispense Refill  . albuterol (PROAIR HFA) 108 (90 Base) MCG/ACT inhaler Inhale 2 puffs into the lungs 4 (four) times daily as needed for wheezing or shortness of breath.    Marland Kitchen albuterol (PROVENTIL) (2.5 MG/3ML) 0.083% nebulizer solution Take 2.5  mg by nebulization every 6 (six) hours as needed for wheezing or shortness of breath.    . allopurinol (ZYLOPRIM) 100 MG tablet Take 100 mg by mouth every other day.     . B Complex Vitamins (VITAMIN B COMPLEX PO) Take 1 tablet by mouth 2 (two) times daily.     . budesonide-formoterol (SYMBICORT) 160-4.5 MCG/ACT inhaler Inhale 2 puffs into the lungs 2 (two) times daily as needed (for SOB).     . carvedilol (COREG) 12.5 MG tablet Take 12.5 mg by mouth 2 (two) times daily.    . cetirizine (ZYRTEC) 10 MG tablet Take 10 mg by mouth daily.     . Cholecalciferol (VITAMIN D) 50 MCG (2000 UT) CAPS Take 2,000 Units by mouth daily.    . clopidogrel (PLAVIX) 75 MG tablet TAKE 1 TABLET BY MOUTH EVERY DAY (Patient taking differently: Take 75 mg by mouth daily. ) 30 tablet 0  . Dulaglutide 1.5 MG/0.5ML SOPN Inject 1.5 mg into the skin every Monday.     . fluticasone (FLONASE) 50 MCG/ACT nasal spray Place 2 sprays into the nose as needed for allergies.     Marland Kitchen insulin lispro protamine-lispro (HUMALOG 75/25 MIX) (75-25) 100 UNIT/ML SUSP injection Inject 35-40 Units into the skin See admin instructions. Inject 40 units into the skin morning and 35 units in the evening    . levothyroxine (SYNTHROID, LEVOTHROID) 150 MCG tablet Take 150 mcg by mouth daily before breakfast.    . magnesium oxide (MAG-OX) 400 (241.3 Mg) MG tablet Take 1 tablet (400 mg total) by mouth 2 (two) times daily. 30 tablet 0  . metolazone (ZAROXOLYN) 5 MG tablet Take 5 mg by mouth 3 (three) times a week.     . mirabegron ER (MYRBETRIQ) 50 MG TB24 tablet Take 50 mg by mouth daily.    . Multiple Vitamin (MULTIVITAMIN WITH MINERALS) TABS tablet Take 1 tablet by mouth daily.    Marland Kitchen oxyCODONE-acetaminophen (PERCOCET) 10-325 MG tablet Take 1 tablet by mouth every 4 (four) hours as  needed for pain. 40 tablet 0  . polyvinyl alcohol (LIQUIFILM TEARS) 1.4 % ophthalmic solution Place 2 drops into both eyes daily as needed for dry eyes.    Marland Kitchen tiZANidine  (ZANAFLEX) 4 MG tablet Take 4 mg by mouth 2 (two) times daily.     Marland Kitchen torsemide (DEMADEX) 100 MG tablet Take 100 mg by mouth 2 (two) times daily.     No current facility-administered medications for this visit.    ROS:   General:  No weight loss, Fever, chills  HEENT: No recent headaches, no nasal bleeding, no visual changes, no sore throat  Neurologic: No dizziness, blackouts, seizures. No recent symptoms of stroke or mini- stroke. No recent episodes of slurred speech, or temporary blindness.  Cardiac: No recent episodes of chest pain/pressure, no shortness of breath at rest.  + shortness of breath with exertion.  Denies history of atrial fibrillation or irregular heartbeat  Vascular: No history of rest pain in feet.  No history of claudication.  No history of non-healing ulcer, No history of DVT   Pulmonary: No home oxygen, no productive cough, no hemoptysis,  No asthma or wheezing  Musculoskeletal:  [X]  Arthritis, [X]  Low back pain,  [X]  Joint pain  Hematologic:No history of hypercoagulable state.  No history of easy bleeding.  No history of anemia  Gastrointestinal: No hematochezia or melena,  No gastroesophageal reflux, no trouble swallowing  Urinary: [X]  chronic Kidney disease, [ ]  on HD - [ ]  MWF or [ ]  TTHS, [ ]  Burning with urination, [ ]  Frequent urination, [ ]  Difficulty urinating;   Skin: No rashes  Psychological: No history of anxiety,  No history of depression   Physical Examination  Vitals:   03/31/20 1230  BP: 136/73  Pulse: 73  Resp: 20  Temp: (!) 97.5 F (36.4 C)  SpO2: 91%  Weight: 246 lb (111.6 kg)  Height: 5\' 3"  (1.6 m)    Body mass index is 43.58 kg/m.  General:  Alert and oriented, no acute distress HEENT: Normal Neck: No JVD Cardiac: Regular Rate and Rhythm  Skin: No rash Extremity Pulses:  2+ radial, brachial, absent dorsalis pedis, posterior tibial pulses bilaterally Musculoskeletal: No deformity 2+ ankle and pedal edema  Neurologic:  Upper and lower extremity motor 5/5 and symmetric  DATA:  Patient had bilateral ABIs performed today which were 0.76 on the right 0.65 on the left essentially unchanged from 2019.  She had a vein mapping ultrasound a few weeks ago.  This showed cephalic vein was thrombosed on the right side.  Left cephalic vein was 2 mm.  Left and right basilic veins were 3 mm or greater.  Upper extremity arterial duplex exam was also obtained which showed no significant stenosis.  I reviewed and interpreted all of the studies.  ASSESSMENT: 1.  Peripheral arterial disease stable since superficial femoral artery stenting in 2017.  2.  CKD4   PLAN: Patient will be scheduled for a left basilic vein transposition fistula on April 05, 2020.  I will try to do this is a 1 stage procedure unless the vein is surprisingly small.  Risk benefits possible complications and procedure details were discussed with the patient today include but not limited to bleeding infection nonmaturation of the fistula ischemic steal.  She understands and agrees to proceed.   [name], MD [facility] Office: [phone]

## 2020-03-31 NOTE — H&P (View-Only) (Signed)
Referring Physician: [name]  Patient name: [name] [name] MRN: [id number] DOB: [birth year] Sex: female  REASON FOR CONSULT: 1.  Follow-up peripheral arterial disease 2.  Hemodialysis access  HPI: [name] [name] is a 69 y.o. female, with a known long history of peripheral arterial disease.  She had a right superficial femoral artery stent placed by me in 2017.  She currently has no claudication symptoms.  However she does not walk very much due to severe degenerative joint disease.  She is considering a hip replacement currently.  She has no rest pain.  She has no nonhealing wounds.  She is additionally sent for evaluation today for placement of hemodialysis access.  She is right-handed.  She has had no prior access procedures.  She currently is not on hemodialysis.  She is CKD4.    Other medical problems include degenerative arthritis asthma congestive failure COPD diabetes all of which are currently stable.  Past Medical History:  Diagnosis Date  . Anemia   . Arthritis   . Asthma   . Bronchitis   . CHF (congestive heart failure) ([place])   . Chronic kidney disease   . Chronic kidney disease   . Chronic pain syndrome   . COPD (chronic obstructive pulmonary disease) ([place])   . Cramping of feet   . Diabetes mellitus   . Dyspnea   . GERD (gastroesophageal reflux disease)   . Gout   . Headache   . History of hiatal hernia   . History of hip replacement, total   . History of transfusion   . Hx MRSA infection    abscess left groin  . Hx of cardiac cath 06/2014  . Hyperlipidemia   . Hypertension   . Hypothyroid   . 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 Morbidly obese ([place])   . Peripheral vascular disease ([place])   . Positive cardiac stress test 12/2013   anterior and lateral ischemia on Myoview  . Sleep apnea    UNABLE TO TOLERATE C PAP  . Stress incontinence   . Thyroid disease   . TIA (transient ischemic attack)     X2 NO RESIDUAL PROBLEMS   Past Surgical History:   Procedure Laterality Date  . ABDOMINAL HYSTERECTOMY    . BACK SURGERY  2020  . COLON SURGERY  1995   DUE TO POLYP  . DILATION AND CURETTAGE OF UTERUS    . FOREARM FRACTURE SURGERY     Left arm  . HERNIA REPAIR     w/ mesh  . INCISION AND DRAINAGE ABSCESS Left 02/04/2013   Procedure: INCISION AND DRAINAGE LEFT BUTTOCK ABSCESS; INCISION AND DRAINAGE LEFT BREAST ABSCESS;  Surgeon: Harl Bowie, MD;  Location: [place];  Service: General;  Laterality: Left;  . INCISION AND DRAINAGE ABSCESS N/A 02/12/2013   Procedure: INCISION AND DEBRIDEMENT BUTTOCK WOUND ;  Surgeon: [name]. Georgette Dover, MD;  Location: [facility];  Service: General;  Laterality: N/A;  . INCISION AND DRAINAGE ABSCESS N/A 02/14/2013   Procedure: INCISION AND DRAINAGE/DRESSING CHANGE;  Surgeon: Harl Bowie, MD;  Location: [place];  Service: General;  Laterality: N/A;  . INCISION AND DRAINAGE ABSCESS N/A 03/21/2015   Procedure: INCISION AND DRAINAGE PUBIC ABSCESS;  Surgeon: Excell Seltzer, MD;  Location: [facility];  Service: General;  Laterality: N/A;  . INCISION AND DRAINAGE PERIRECTAL ABSCESS Left 02/10/2013   Procedure: IRRIGATION AND DEBRIDEMENT OF BUTTOCK/PERINEAL ABSCESS;  Surgeon: [name]. Georgette Dover, MD;  Location: [place];  Service:  General;  Laterality: Left;  . INCISION AND DRAINAGE PERIRECTAL ABSCESS N/A 02/16/2013   Procedure: IRRIGATION AND DEBRIDEMENT PERINEAL ABSCESS;  Surgeon: Zenovia Jarred, MD;  Location: [place];  Service: General;  Laterality: N/A;  . IRRIGATION AND DEBRIDEMENT ABSCESS N/A 02/06/2013   Procedure: IRRIGATION AND DEBRIDEMENT BUTTOCK ABSCESS AND DRESSING CHANGE;  Surgeon: Harl Bowie, MD;  Location: [place];  Service: General;  Laterality: N/A;  . IRRIGATION AND DEBRIDEMENT ABSCESS Left 02/08/2013   Procedure: IRRIGATION AND DEBRIDEMENT ABSCESS/DRESSING CHANGE;  Surgeon: Gwenyth Ober, MD;  Location: [place];  Service: General;  Laterality: Left;  . JOINT REPLACEMENT  2010 / 2012   . LAPAROSCOPIC  CHOLECYSTECTOMY    . LEFT HEART CATHETERIZATION WITH CORONARY ANGIOGRAM N/A 07/02/2014   Procedure: LEFT HEART CATHETERIZATION WITH CORONARY ANGIOGRAM;  Surgeon: Lorretta Harp, MD;  Location: [facility] C[facility];  Service: Cardiovascular;  Laterality: N/A;  . LOWER EXTREMITY ANGIOGRAM Right 04/20/2016   Procedure: Lower Extremity Angiogram;  Surgeon: Elam Dutch, MD;  Location: [place] [facility];  Service: Cardiovascular;  Laterality: Right;  . PERIPHERAL VASCULAR CATHETERIZATION N/A 04/20/2016   Procedure: Abdominal Aortogram;  Surgeon: Elam Dutch, MD;  Location: [place] [facility];  Service: Cardiovascular;  Laterality: N/A;  . PERIPHERAL VASCULAR CATHETERIZATION Right 04/20/2016   Procedure: Peripheral Vascular Intervention;  Surgeon: Elam Dutch, MD;  Location: [place] [facility];  Service: Cardiovascular;  Laterality: Right;  popiteal  . RIGHT HEART CATH N/A 10/27/2018   Procedure: RIGHT HEART CATH;  Surgeon: Larey Dresser, MD;  Location: [place] [facility];  Service: Cardiovascular;  Laterality: N/A;  . RIGHT HEART CATH N/A 03/20/2019   Procedure: RIGHT HEART CATH;  Surgeon: Larey Dresser, MD;  Location: [place] [facility];  Service: Cardiovascular;  Laterality: N/A;  . THYROIDECTOMY    . TOTAL HIP REVISION Right 08/11/2014   Procedure: RIGHT ACETABULAR REVISION;  Surgeon: Gearlean Alf, MD;  Location: [facility];  Service: Orthopedics;  Laterality: Right;  Marland Kitchen VASCULAR SURGERY      Family History  Problem Relation Age of Onset  . Diabetes Brother   . Cardiomyopathy Mother   . Heart disease Mother   . Cancer Father     SOCIAL HISTORY: Social History   Socioeconomic History  . Marital status: Legally Separated    Spouse name: Not on file  . Number of children: 1  . Years of education: college  . Highest education level: Not on file  Occupational History    Comment: Disabled  Tobacco Use  . Smoking status: Current Some Day Smoker    Packs/day: 0.25    Years: 40.00     Pack years: 10.00    Types: Cigarettes  . Smokeless tobacco: Never Used  Vaping Use  . Vaping Use: Never used  Substance and Sexual Activity  . Alcohol use: Yes    Alcohol/week: 1.0 standard drink    Types: 1 Standard drinks or equivalent per week    Comment: OCC  . Drug use: No    Comment: Quit four years ago.  Marland Kitchen Sexual activity: Not on file  Other Topics Concern  . Not on file  Social History Narrative   Patient is separated  and lives at home with her friend. Patient is disabled.   Education one year of college   Right handed   Caffeine two cups daily.      Social Determinants of Health   Financial Resource Strain:   .  Difficulty of Paying Living Expenses:   Food Insecurity:   . Worried About Charity fundraiser in the Last Year:   . Arboriculturist in the Last Year:   Transportation Needs:   . Film/video editor (Medical):   Marland Kitchen Lack of Transportation (Non-Medical):   Physical Activity:   . Days of Exercise per Week:   . Minutes of Exercise per Session:   Stress:   . Feeling of Stress :   Social Connections:   . Frequency of Communication with Friends and Family:   . Frequency of Social Gatherings with Friends and Family:   . Attends Religious Services:   . Active Member of Clubs or Organizations:   . Attends Archivist Meetings:   Marland Kitchen Marital Status:   Intimate Partner Violence:   . Fear of Current or Ex-Partner:   . Emotionally Abused:   Marland Kitchen Physically Abused:   . Sexually Abused:     Allergies  Allergen Reactions  . Nebivolol Swelling    Chest pain  . Ace Inhibitors Swelling and Other (See Comments)    Tongue swell  . Morphine And Related Itching    Current Outpatient Medications  Medication Sig Dispense Refill  . albuterol (PROAIR HFA) 108 (90 Base) MCG/ACT inhaler Inhale 2 puffs into the lungs 4 (four) times daily as needed for wheezing or shortness of breath.    Marland Kitchen albuterol (PROVENTIL) (2.5 MG/3ML) 0.083% nebulizer solution Take 2.5  mg by nebulization every 6 (six) hours as needed for wheezing or shortness of breath.    . allopurinol (ZYLOPRIM) 100 MG tablet Take 100 mg by mouth every other day.     . B Complex Vitamins (VITAMIN B COMPLEX PO) Take 1 tablet by mouth 2 (two) times daily.     . budesonide-formoterol (SYMBICORT) 160-4.5 MCG/ACT inhaler Inhale 2 puffs into the lungs 2 (two) times daily as needed (for SOB).     . carvedilol (COREG) 12.5 MG tablet Take 12.5 mg by mouth 2 (two) times daily.    . cetirizine (ZYRTEC) 10 MG tablet Take 10 mg by mouth daily.     . Cholecalciferol (VITAMIN D) 50 MCG (2000 UT) CAPS Take 2,000 Units by mouth daily.    . clopidogrel (PLAVIX) 75 MG tablet TAKE 1 TABLET BY MOUTH EVERY DAY (Patient taking differently: Take 75 mg by mouth daily. ) 30 tablet 0  . Dulaglutide 1.5 MG/0.5ML SOPN Inject 1.5 mg into the skin every Monday.     . fluticasone (FLONASE) 50 MCG/ACT nasal spray Place 2 sprays into the nose as needed for allergies.     Marland Kitchen insulin lispro protamine-lispro (HUMALOG 75/25 MIX) (75-25) 100 UNIT/ML SUSP injection Inject 35-40 Units into the skin See admin instructions. Inject 40 units into the skin morning and 35 units in the evening    . levothyroxine (SYNTHROID, LEVOTHROID) 150 MCG tablet Take 150 mcg by mouth daily before breakfast.    . magnesium oxide (MAG-OX) 400 (241.3 Mg) MG tablet Take 1 tablet (400 mg total) by mouth 2 (two) times daily. 30 tablet 0  . metolazone (ZAROXOLYN) 5 MG tablet Take 5 mg by mouth 3 (three) times a week.     . mirabegron ER (MYRBETRIQ) 50 MG TB24 tablet Take 50 mg by mouth daily.    . Multiple Vitamin (MULTIVITAMIN WITH MINERALS) TABS tablet Take 1 tablet by mouth daily.    Marland Kitchen oxyCODONE-acetaminophen (PERCOCET) 10-325 MG tablet Take 1 tablet by mouth every 4 (four) hours as  needed for pain. 40 tablet 0  . polyvinyl alcohol (LIQUIFILM TEARS) 1.4 % ophthalmic solution Place 2 drops into both eyes daily as needed for dry eyes.    Marland Kitchen tiZANidine  (ZANAFLEX) 4 MG tablet Take 4 mg by mouth 2 (two) times daily.     Marland Kitchen torsemide (DEMADEX) 100 MG tablet Take 100 mg by mouth 2 (two) times daily.     No current facility-administered medications for this visit.    ROS:   General:  No weight loss, Fever, chills  HEENT: No recent headaches, no nasal bleeding, no visual changes, no sore throat  Neurologic: No dizziness, blackouts, seizures. No recent symptoms of stroke or mini- stroke. No recent episodes of slurred speech, or temporary blindness.  Cardiac: No recent episodes of chest pain/pressure, no shortness of breath at rest.  + shortness of breath with exertion.  Denies history of atrial fibrillation or irregular heartbeat  Vascular: No history of rest pain in feet.  No history of claudication.  No history of non-healing ulcer, No history of DVT   Pulmonary: No home oxygen, no productive cough, no hemoptysis,  No asthma or wheezing  Musculoskeletal:  [X]  Arthritis, [X]  Low back pain,  [X]  Joint pain  Hematologic:No history of hypercoagulable state.  No history of easy bleeding.  No history of anemia  Gastrointestinal: No hematochezia or melena,  No gastroesophageal reflux, no trouble swallowing  Urinary: [X]  chronic Kidney disease, [ ]  on HD - [ ]  MWF or [ ]  TTHS, [ ]  Burning with urination, [ ]  Frequent urination, [ ]  Difficulty urinating;   Skin: No rashes  Psychological: No history of anxiety,  No history of depression   Physical Examination  Vitals:   03/31/20 1230  BP: 136/73  Pulse: 73  Resp: 20  Temp: (!) 97.5 F (36.4 C)  SpO2: 91%  Weight: 246 lb (111.6 kg)  Height: 5\' 3"  (1.6 m)    Body mass index is 43.58 kg/m.  General:  Alert and oriented, no acute distress HEENT: Normal Neck: No JVD Cardiac: Regular Rate and Rhythm  Skin: No rash Extremity Pulses:  2+ radial, brachial, absent dorsalis pedis, posterior tibial pulses bilaterally Musculoskeletal: No deformity 2+ ankle and pedal edema  Neurologic:  Upper and lower extremity motor 5/5 and symmetric  DATA:  Patient had bilateral ABIs performed today which were 0.76 on the right 0.65 on the left essentially unchanged from 2019.  She had a vein mapping ultrasound a few weeks ago.  This showed cephalic vein was thrombosed on the right side.  Left cephalic vein was 2 mm.  Left and right basilic veins were 3 mm or greater.  Upper extremity arterial duplex exam was also obtained which showed no significant stenosis.  I reviewed and interpreted all of the studies.  ASSESSMENT: 1.  Peripheral arterial disease stable since superficial femoral artery stenting in 2017.  2.  CKD4   PLAN: Patient will be scheduled for a left basilic vein transposition fistula on April 05, 2020.  I will try to do this is a 1 stage procedure unless the vein is surprisingly small.  Risk benefits possible complications and procedure details were discussed with the patient today include but not limited to bleeding infection nonmaturation of the fistula ischemic steal.  She understands and agrees to proceed.   [name], MD [facility] Office: [phone]

## 2020-04-01 ENCOUNTER — Other Ambulatory Visit (HOSPITAL_COMMUNITY)
Admission: RE | Admit: 2020-04-01 | Discharge: 2020-04-01 | Disposition: A | Discharge status: Home / Self Care | Payer: Medicare Other | Source: Ambulatory Visit | Attending: Vascular Surgery | Admitting: Vascular Surgery

## 2020-04-01 ENCOUNTER — Encounter (HOSPITAL_COMMUNITY)
Admission: RE | Admit: 2020-04-01 | Discharge: 2020-04-01 | Disposition: A | Discharge status: Home / Self Care | Payer: Medicare Other | Source: Ambulatory Visit | Attending: Internal Medicine | Admitting: Internal Medicine

## 2020-04-01 DIAGNOSIS — Z01812 Encounter for preprocedural laboratory examination: Secondary | ICD-10-CM | POA: Insufficient documentation

## 2020-04-01 DIAGNOSIS — D631 Anemia in chronic kidney disease: Secondary | ICD-10-CM | POA: Diagnosis not present

## 2020-04-01 DIAGNOSIS — Z20822 Contact with and (suspected) exposure to covid-19: Secondary | ICD-10-CM | POA: Diagnosis not present

## 2020-04-01 DIAGNOSIS — N189 Chronic kidney disease, unspecified: Secondary | ICD-10-CM | POA: Diagnosis present

## 2020-04-01 LAB — SARS CORONAVIRUS 2 (TAT 6-24 HRS): SARS Coronavirus 2: NEGATIVE

## 2020-04-01 MED ORDER — SODIUM CHLORIDE 0.9 % IV SOLN
510.0000 mg | INTRAVENOUS | Status: DC
  Administered 2020-04-01: 510 mg via INTRAVENOUS
  Filled 2020-04-01: qty 17

## 2020-04-04 ENCOUNTER — Encounter (HOSPITAL_COMMUNITY): Payer: Self-pay | Admitting: Vascular Surgery

## 2020-04-04 ENCOUNTER — Other Ambulatory Visit: Payer: Self-pay

## 2020-04-04 NOTE — Anesthesia Preprocedure Evaluation (Addendum)
Anesthesia Evaluation  Patient identified by MRN, date of birth, ID band Patient awake    Reviewed: Allergy & Precautions, NPO status , Patient's Chart, lab work & pertinent test results  Airway Mallampati: II  TM Distance: >3 FB Neck ROM: Full    Dental  (+) Edentulous Upper, Dental Advisory Given   Pulmonary shortness of breath and with exertion, asthma , sleep apnea , COPD,  COPD inhaler, Current Smoker and Patient abstained from smoking.,  Unable to tolerate cpap COPD- symbicort and albuterol- last used albuterol 2-3 weeks ago 5-6 cigarettes per day    Pulmonary exam normal breath sounds clear to auscultation       Cardiovascular hypertension, Pt. on medications and Pt. on home beta blockers + Peripheral Vascular Disease and +CHF  Normal cardiovascular exam Rhythm:Regular Rate:Normal  false positive stress test with normal coronaries (2015),   PVD s/p stents BLE- on plavix  Last echo 03/2019: 1. The left ventricle has mild-moderately reduced systolic function, with  an ejection fraction of 40-45%. The cavity size was normal. Left  ventricular diastolic Doppler parameters are consistent with impaired  relaxation.  2. Left atrial size was not well visualized.  3. The aortic root and ascending aorta are normal in size and structure.  4. The interatrial septum was not well visualized.   01/2020 admission for UTI and subsequent sepsis- acute HF exacerbation during this admission, has been following outpt with HF clinic   Neuro/Psych TIAnegative psych ROS   GI/Hepatic Neg liver ROS, hiatal hernia, GERD  Medicated and Controlled,  Endo/Other  diabetes, Well Controlled, Type 2, Insulin DependentHypothyroidism Morbid obesityBMI 41 Last a1c 6.5  Renal/GU CRF, ESRF and DialysisRenal disease Bladder dysfunction  Stress incontinence     Musculoskeletal  (+) Arthritis , Osteoarthritis,  Chronic pain   Abdominal (+) +  obese,   Peds  Hematology  (+) Blood dyscrasia, anemia ,   Anesthesia Other Findings   Reproductive/Obstetrics negative OB ROS                           Anesthesia Physical Anesthesia Plan  ASA: IV  Anesthesia Plan: Regional and MAC   Post-op Pain Management:  Regional for Post-op pain   Induction:   PONV Risk Score and Plan: 1 and Propofol infusion, TIVA and Treatment may vary due to age or medical condition  Airway Management Planned: Nasal Cannula and Natural Airway  Additional Equipment: None  Intra-op Plan:   Post-operative Plan:   Informed Consent: I have reviewed the patients History and Physical, chart, labs and discussed the procedure including the risks, benefits and alternatives for the proposed anesthesia with the patient or authorized representative who has indicated his/her understanding and acceptance.     Dental advisory given  Plan Discussed with: CRNA  Anesthesia Plan Comments: (K 2.8- will supplement potassium IV and proceed)     Anesthesia Quick Evaluation

## 2020-04-04 NOTE — Progress Notes (Signed)
Anesthesia Chart Review: [name] [name]   Case: [id number] Date/Time: 04/05/20 1358   Procedure: BASILIC VEIN TRANSPOSITION (Left )   Anesthesia type: Choice   Pre-op diagnosis: END STAGE RENAL DISEASE   Location: [facility] OR ROOM 11 / [place] [facility]   Surgeons: Angelia Mould, MD      DISCUSSION: Patient is a 69 year old female scheduled for the above procedure.  History includes smoking, DM2, HTN, HLD, asthma, CKD (stage IV), chronic systolic and diastolic CHF, left BBB, false positive stress test with normal coronaries (2015), GERD, hiatal hernia, thyroidectomy (postsurgical hypothyroidism), PVD (s/p right SFA/popliteal artery stents 04/20/16), COPD, dyspnea, OSA (intolerant to CPAP), chronic pain syndrome, anemia, right THA (08/05/09, s/p revision 08/03/11 and 08/21/14), necrotizing fascitis left thigh/groin (s/p operative debridement 11/2008), perirectal/perineal abscess (2014), LLL lung nodule (followed by pulmonology, stable 08/2019), L4-5 PLIF (09/18/19). Last BMI is consistent with morbid obesity.  - Admission 01/2020 for UTI sepsis (gram-negative/E. coli bacteremia), treated with antibiotics and IVF resuscitation leading to volume overload requiring Lasix. She had worsening renal function but declined hemodialysis. Discharged on torsemide/metolazone and out-patient nephrology follow-up.  - Patient was evaluated at [facility] by Lyda Jester, PA-C on 03/15/20. Discussion that she had been referred for AVF creation, although not yet ready to start hemodialysis. HF medication management limited to CKD, and she has refused/been intolerant to some medications. She wants to stop being followed at the HF clinic and prefers to continue follow-up with primary cardiologist Dr. Gwenlyn Found.  She is on Plavix. Previous cardiology noted suggest that she is on Plavix for either prior TIA and/or PAD. Patient reported that [facility] staff said she did not have to hold Plavix for this procedure. (PAT phone RN  plans to follow-up with [facility] to confirm.)  04/01/20 presurgical COVID-19 test negative. She is for labs and anesthesia team evaluation on the day of surgery.   VS:   Wt Readings from Last 3 Encounters:  04/01/20 108.4 kg  03/31/20 111.6 kg  03/15/20 110.5 kg   BP Readings from Last 3 Encounters:  04/01/20 139/61  03/31/20 136/73  03/25/20 (!) 147/71   Pulse Readings from Last 3 Encounters:  04/01/20 81  03/31/20 73  03/25/20 77    PROVIDERS: [name], MD is PCP - Cardiologist: Quay Burow, MD  - HF Cardiologist: Loralie Champagne, MD - Rehab: Suella Broad, MD - Pulmonologist: Kara Mead, MD - Nephrologist: Jannifer Hick, MD   LABS: She is for labs on arrival. As of 03/25/20, Cr 3.07, glucose 208, HGB 9.8. A1c 6.5% 01/06/20.   IMAGES: 1V CXR 01/07/20: FINDINGS: The heart size and mediastinal contours are stable. Atherosclerotic calcification of the aortic knob. Unchanged mildly prominent interstitial markings. No focal airspace consolidation, pleural effusion, or pneumothorax. IMPRESSION: No active disease.  Stable exam.  CT Chest 08/10/19: IMPRESSION: - Stable 12 mm left lower lobe pulmonary nodule. Recommend continued follow-up with chest CT in 6 months or further evaluation with PET-CT. - No evidence of lymphadenopathy or pleural effusion. - Aortic Atherosclerosis (ICD10-I70.0). (Follow-up chest CT in 6 months per Geraldo Pitter, NP)   EKG:  01/06/20: Sinus rhythm Nonspecific IVCD with LAD LVH with secondary repolarization abnormality Confirmed by Pattricia Boss [phone]) on 01/07/2020 11:41:54 AM  01/05/20: Normal sinus rhythm Left axis deviation Left ventricular hypertrophy with QRS widening and repolarization abnormality ( R in aVL , Cornell product ) Abnormal ECG No significant change since last tracing Confirmed by Merrily Pew [phone]) on 01/05/2020 11:24:17 PM  09/15/19: Normal sinus rhythm with  sinus arrhythmia Left axis  deviation Left bundle branch block No significant change since last tracing Confirmed by Croitoru, Mihai [phone]) on 09/15/2019 6:04:14 PM - LBBB is old   CV: RHC 03/20/19: Findings: Hemodynamics (mmHg) RA mean 5 RV 25/1 PA 32/9, mean 17 PCWP mean 8  Oxygen saturations: PA 61% AO 96%  Cardiac Output (Fick) 5.61  Cardiac Index (Fick) 2.61   Echo 03/18/19: IMPRESSIONS 1. The left ventricle has mild-moderately reduced systolic function, with an ejection fraction of 40-45%. The cavity size was normal. Left ventricular diastolic Doppler parameters are consistent with impaired relaxation. 2. Left atrial size was not well visualized. 3. The aortic root and ascending aorta are normal in size and structure. 4. The interatrial septum was not well visualized.   LHC 07/02/14: ANGIOGRAPHIC RESULTS:  1. Left main; normal  2. LAD; normal 3. Left circumflex; left dominant and normal.  4. Right coronary artery; nondominant and normal 5. Left ventriculography; RAO left ventriculogram was performed using  25 mL of Visipaque dye at 12 mL/second. The overall LVEF estimated  40-45 % Without wall motion abnormalities IMPRESSION:Ms. Kass has normal coronary arteries and mild LV dysfunction. She is left dominant. I believe her Myoview was false positive because of her body habitus.    Past Medical History:  Diagnosis Date  . Anemia   . Arthritis   . Asthma   . Bronchitis   . CHF (congestive heart failure) ([place])   . Chronic kidney disease   . Chronic kidney disease   . Chronic pain syndrome   . COPD (chronic obstructive pulmonary disease) ([place])   . Cramping of feet   . Diabetes mellitus   . Dyspnea   . Full dentures   . GERD (gastroesophageal reflux disease)   . Gout   . Headache   . History of hiatal hernia   . History of hip replacement, total   . History of transfusion   . Hx MRSA infection    abscess left groin  . Hx of cardiac cath 06/2014  . Hyperlipidemia   .  Hypertension   . Hypothyroid   . 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 Morbidly obese ([place])   . Peripheral vascular disease ([place])   . Positive cardiac stress test 12/2013   anterior and lateral ischemia on Myoview  . Sleep apnea    UNABLE TO TOLERATE C PAP  . Stress incontinence   . Thyroid disease   . TIA (transient ischemic attack)     X2 NO RESIDUAL PROBLEMS  . Wears glasses     Past Surgical History:  Procedure Laterality Date  . ABDOMINAL HYSTERECTOMY    . BACK SURGERY  2020  . COLON SURGERY  1995   DUE TO POLYP  . DILATION AND CURETTAGE OF UTERUS    . FOREARM FRACTURE SURGERY     Left arm  . HERNIA REPAIR     w/ mesh  . INCISION AND DRAINAGE ABSCESS Left 02/04/2013   Procedure: INCISION AND DRAINAGE LEFT BUTTOCK ABSCESS; INCISION AND DRAINAGE LEFT BREAST ABSCESS;  Surgeon: Harl Bowie, MD;  Location: [place];  Service: General;  Laterality: Left;  . INCISION AND DRAINAGE ABSCESS N/A 02/12/2013   Procedure: INCISION AND DEBRIDEMENT BUTTOCK WOUND ;  Surgeon: [name]. Georgette Dover, MD;  Location: [place];  Service: General;  Laterality: N/A;  . INCISION AND DRAINAGE ABSCESS N/A 02/14/2013   Procedure: INCISION AND DRAINAGE/DRESSING CHANGE;  Surgeon: Harl Bowie, MD;  Location: [place];  Service: General;  Laterality: N/A;  . INCISION AND DRAINAGE ABSCESS N/A 03/21/2015   Procedure: INCISION AND DRAINAGE PUBIC ABSCESS;  Surgeon: Excell Seltzer, MD;  Location: [facility];  Service: General;  Laterality: N/A;  . INCISION AND DRAINAGE PERIRECTAL ABSCESS Left 02/10/2013   Procedure: IRRIGATION AND DEBRIDEMENT OF BUTTOCK/PERINEAL ABSCESS;  Surgeon: [name]. Georgette Dover, MD;  Location: [place];  Service: General;  Laterality: Left;  . INCISION AND DRAINAGE PERIRECTAL ABSCESS N/A 02/16/2013   Procedure: IRRIGATION AND DEBRIDEMENT PERINEAL ABSCESS;  Surgeon: Zenovia Jarred, MD;  Location: [place];  Service: General;  Laterality: N/A;  . IRRIGATION AND DEBRIDEMENT ABSCESS N/A 02/06/2013    Procedure: IRRIGATION AND DEBRIDEMENT BUTTOCK ABSCESS AND DRESSING CHANGE;  Surgeon: Harl Bowie, MD;  Location: [place];  Service: General;  Laterality: N/A;  . IRRIGATION AND DEBRIDEMENT ABSCESS Left 02/08/2013   Procedure: IRRIGATION AND DEBRIDEMENT ABSCESS/DRESSING CHANGE;  Surgeon: Gwenyth Ober, MD;  Location: [place];  Service: General;  Laterality: Left;  . JOINT REPLACEMENT  2010 / 2012   . LAPAROSCOPIC CHOLECYSTECTOMY    . LEFT HEART CATHETERIZATION WITH CORONARY ANGIOGRAM N/A 07/02/2014   Procedure: LEFT HEART CATHETERIZATION WITH CORONARY ANGIOGRAM;  Surgeon: Lorretta Harp, MD;  Location: [facility] CATH LAB;  Service: Cardiovascular;  Laterality: N/A;  . LOWER EXTREMITY ANGIOGRAM Right 04/20/2016   Procedure: Lower Extremity Angiogram;  Surgeon: Elam Dutch, MD;  Location: [place] CV LAB;  Service: Cardiovascular;  Laterality: Right;  . MULTIPLE TOOTH EXTRACTIONS    . PERIPHERAL VASCULAR CATHETERIZATION N/A 04/20/2016   Procedure: Abdominal Aortogram;  Surgeon: Elam Dutch, MD;  Location: [place] CV LAB;  Service: Cardiovascular;  Laterality: N/A;  . PERIPHERAL VASCULAR CATHETERIZATION Right 04/20/2016   Procedure: Peripheral Vascular Intervention;  Surgeon: Elam Dutch, MD;  Location: [place] CV LAB;  Service: Cardiovascular;  Laterality: Right;  popiteal  . RIGHT HEART CATH N/A 10/27/2018   Procedure: RIGHT HEART CATH;  Surgeon: Larey Dresser, MD;  Location: [place] CV LAB;  Service: Cardiovascular;  Laterality: N/A;  . RIGHT HEART CATH N/A 03/20/2019   Procedure: RIGHT HEART CATH;  Surgeon: Larey Dresser, MD;  Location: [place] CV LAB;  Service: Cardiovascular;  Laterality: N/A;  . THYROIDECTOMY    . TOTAL HIP REVISION Right 08/11/2014   Procedure: RIGHT ACETABULAR REVISION;  Surgeon: Gearlean Alf, MD;  Location: [facility];  Service: Orthopedics;  Laterality: Right;  Marland Kitchen VASCULAR SURGERY      MEDICATIONS: No current facility-administered  medications for this encounter.   Marland Kitchen albuterol (PROAIR HFA) 108 (90 Base) MCG/ACT inhaler  . albuterol (PROVENTIL) (2.5 MG/3ML) 0.083% nebulizer solution  . B Complex Vitamins (VITAMIN B COMPLEX PO)  . budesonide-formoterol (SYMBICORT) 160-4.5 MCG/ACT inhaler  . carvedilol (COREG) 12.5 MG tablet  . Cholecalciferol (VITAMIN D) 50 MCG (2000 UT) CAPS  . clopidogrel (PLAVIX) 75 MG tablet  . diphenhydrAMINE (BENADRYL) 25 MG tablet  . Dulaglutide 1.5 MG/0.5ML SOPN  . fluticasone (FLONASE) 50 MCG/ACT nasal spray  . hydrOXYzine (ATARAX/VISTARIL) 25 MG tablet  . insulin lispro protamine-lispro (HUMALOG 75/25 MIX) (75-25) 100 UNIT/ML SUSP injection  . levothyroxine (SYNTHROID, LEVOTHROID) 150 MCG tablet  . magnesium oxide (MAG-OX) 400 (241.3 Mg) MG tablet  . metolazone (ZAROXOLYN) 5 MG tablet  . mirabegron ER (MYRBETRIQ) 50 MG TB24 tablet  . Multiple Vitamin (MULTIVITAMIN WITH MINERALS) TABS tablet  . oxyCODONE-acetaminophen (PERCOCET) 10-325 MG tablet  . pantoprazole (PROTONIX) 40 MG tablet  . polyvinyl alcohol (LIQUIFILM  TEARS) 1.4 % ophthalmic solution  . torsemide (DEMADEX) 100 MG tablet  . allopurinol (ZYLOPRIM) 100 MG tablet    Myra Gianotti, PA-C Surgical Short Stay/Anesthesiology [facility] Phone [phone] [facility] Phone [phone] 04/04/2020 12:48 PM

## 2020-04-04 NOTE — Progress Notes (Signed)
Nurse spoke with Herma Ard at [facility] to clarify if pt is to continue taking Plavix.

## 2020-04-04 NOTE — Progress Notes (Signed)
Pt denies SOB and chest pain. Pt stated that she is under the care of Dr. Gwenlyn Found, Cardiology and Dr. [name], PCP. Pt made aware to stop taking vitamins, fish oil and herbal medications. Do not take any NSAIDs ie: Ibuprofen, Advil, Naproxen (Aleve), Motrin, BC and Goody Powder. Pt stated that surgeon stated " don't worry about it"  when she asked if she should stop taking Plavix. Pt made aware to take 70% (24 units) of Humalog 75/25 insulin this evening and no insulin DOS. Pt made aware to check CBG every 2 hours prior to arrival to hospital on DOS. Pt made aware to treat a CBG < 70 with 4 ounces of cranberry juice, wait 15 minutes after intervention to recheck CBG, if CBG remains < 70, call Short Stay unit to speak with a nurse. Pt reminded to quarantine. Pt verbalized understanding of all pre-op instructions. PA, Anesthesiology asked to review pt history; see note.

## 2020-04-05 ENCOUNTER — Encounter (HOSPITAL_COMMUNITY)
Admission: RE | Disposition: A | Discharge status: Home / Self Care | Payer: Self-pay | Source: Home / Self Care | Attending: Vascular Surgery

## 2020-04-05 ENCOUNTER — Ambulatory Visit (HOSPITAL_COMMUNITY): Payer: Medicare Other | Admitting: Vascular Surgery

## 2020-04-05 ENCOUNTER — Ambulatory Visit (HOSPITAL_COMMUNITY)
Admission: RE | Admit: 2020-04-05 | Discharge: 2020-04-05 | Disposition: A | Discharge status: Home / Self Care | Payer: Medicare Other | Attending: Vascular Surgery | Admitting: Vascular Surgery

## 2020-04-05 ENCOUNTER — Other Ambulatory Visit: Payer: Self-pay

## 2020-04-05 ENCOUNTER — Encounter (HOSPITAL_COMMUNITY): Payer: Self-pay | Admitting: Vascular Surgery

## 2020-04-05 DIAGNOSIS — Z7902 Long term (current) use of antithrombotics/antiplatelets: Secondary | ICD-10-CM | POA: Insufficient documentation

## 2020-04-05 DIAGNOSIS — E890 Postprocedural hypothyroidism: Secondary | ICD-10-CM | POA: Insufficient documentation

## 2020-04-05 DIAGNOSIS — N184 Chronic kidney disease, stage 4 (severe): Secondary | ICD-10-CM

## 2020-04-05 DIAGNOSIS — Z96641 Presence of right artificial hip joint: Secondary | ICD-10-CM | POA: Insufficient documentation

## 2020-04-05 DIAGNOSIS — Z885 Allergy status to narcotic agent status: Secondary | ICD-10-CM | POA: Diagnosis not present

## 2020-04-05 DIAGNOSIS — Z7951 Long term (current) use of inhaled steroids: Secondary | ICD-10-CM | POA: Diagnosis not present

## 2020-04-05 DIAGNOSIS — Z8673 Personal history of transient ischemic attack (TIA), and cerebral infarction without residual deficits: Secondary | ICD-10-CM | POA: Insufficient documentation

## 2020-04-05 DIAGNOSIS — E1122 Type 2 diabetes mellitus with diabetic chronic kidney disease: Secondary | ICD-10-CM | POA: Insufficient documentation

## 2020-04-05 DIAGNOSIS — Z882 Allergy status to sulfonamides status: Secondary | ICD-10-CM | POA: Insufficient documentation

## 2020-04-05 DIAGNOSIS — F17210 Nicotine dependence, cigarettes, uncomplicated: Secondary | ICD-10-CM | POA: Insufficient documentation

## 2020-04-05 DIAGNOSIS — G4730 Sleep apnea, unspecified: Secondary | ICD-10-CM | POA: Diagnosis not present

## 2020-04-05 DIAGNOSIS — Z833 Family history of diabetes mellitus: Secondary | ICD-10-CM | POA: Insufficient documentation

## 2020-04-05 DIAGNOSIS — Z8249 Family history of ischemic heart disease and other diseases of the circulatory system: Secondary | ICD-10-CM | POA: Insufficient documentation

## 2020-04-05 DIAGNOSIS — Z79899 Other long term (current) drug therapy: Secondary | ICD-10-CM | POA: Diagnosis not present

## 2020-04-05 DIAGNOSIS — Z79890 Hormone replacement therapy (postmenopausal): Secondary | ICD-10-CM | POA: Insufficient documentation

## 2020-04-05 DIAGNOSIS — J449 Chronic obstructive pulmonary disease, unspecified: Secondary | ICD-10-CM | POA: Diagnosis not present

## 2020-04-05 DIAGNOSIS — I132 Hypertensive heart and chronic kidney disease with heart failure and with stage 5 chronic kidney disease, or end stage renal disease: Secondary | ICD-10-CM | POA: Diagnosis not present

## 2020-04-05 DIAGNOSIS — Z6841 Body Mass Index (BMI) 40.0 and over, adult: Secondary | ICD-10-CM | POA: Diagnosis not present

## 2020-04-05 DIAGNOSIS — M109 Gout, unspecified: Secondary | ICD-10-CM | POA: Insufficient documentation

## 2020-04-05 DIAGNOSIS — Z955 Presence of coronary angioplasty implant and graft: Secondary | ICD-10-CM | POA: Diagnosis not present

## 2020-04-05 DIAGNOSIS — M1990 Unspecified osteoarthritis, unspecified site: Secondary | ICD-10-CM | POA: Diagnosis not present

## 2020-04-05 DIAGNOSIS — E785 Hyperlipidemia, unspecified: Secondary | ICD-10-CM | POA: Diagnosis not present

## 2020-04-05 DIAGNOSIS — K219 Gastro-esophageal reflux disease without esophagitis: Secondary | ICD-10-CM | POA: Diagnosis not present

## 2020-04-05 DIAGNOSIS — E1151 Type 2 diabetes mellitus with diabetic peripheral angiopathy without gangrene: Secondary | ICD-10-CM | POA: Diagnosis not present

## 2020-04-05 DIAGNOSIS — Z794 Long term (current) use of insulin: Secondary | ICD-10-CM | POA: Diagnosis not present

## 2020-04-05 HISTORY — DX: Complete loss of teeth, unspecified cause, unspecified class: K08.109

## 2020-04-05 HISTORY — DX: Complete loss of teeth, unspecified cause, unspecified class: Z97.2

## 2020-04-05 HISTORY — PX: BASCILIC VEIN TRANSPOSITION: SHX5742

## 2020-04-05 HISTORY — DX: Presence of spectacles and contact lenses: Z97.3

## 2020-04-05 LAB — POCT I-STAT, CHEM 8
BUN: >130 mg/dL — ABNORMAL HIGH (ref 8–23)
BUN: >130 mg/dL — ABNORMAL HIGH (ref 8–23)
Calcium, Ion: 1.25 mmol/L (ref 1.15–1.40)
Calcium, Ion: 1.3 mmol/L (ref 1.15–1.40)
Chloride: 86 mmol/L — ABNORMAL LOW (ref 98–111)
Chloride: 85 mmol/L — ABNORMAL LOW (ref 98–111)
Creatinine, Ser: 3.1 mg/dL — ABNORMAL HIGH (ref 0.44–1.00)
Creatinine, Ser: 3.7 mg/dL — ABNORMAL HIGH (ref 0.44–1.00)
Glucose, Bld: 148 mg/dL — ABNORMAL HIGH (ref 70–99)
Glucose, Bld: 218 mg/dL — ABNORMAL HIGH (ref 70–99)
HCT: 33 % — ABNORMAL LOW (ref 36.0–46.0)
HCT: 31 % — ABNORMAL LOW (ref 36.0–46.0)
Hemoglobin: 11.2 g/dL — ABNORMAL LOW (ref 12.0–15.0)
Hemoglobin: 10.5 g/dL — ABNORMAL LOW (ref 12.0–15.0)
Potassium: 2.8 mmol/L — ABNORMAL LOW (ref 3.5–5.1)
Potassium: 3.1 mmol/L — ABNORMAL LOW (ref 3.5–5.1)
Sodium: 133 mmol/L — ABNORMAL LOW (ref 135–145)
Sodium: 132 mmol/L — ABNORMAL LOW (ref 135–145)
TCO2: 34 mmol/L — ABNORMAL HIGH (ref 22–32)
TCO2: 35 mmol/L — ABNORMAL HIGH (ref 22–32)

## 2020-04-05 LAB — GLUCOSE, CAPILLARY
Glucose-Capillary: 162 mg/dL — ABNORMAL HIGH (ref 70–99)
Glucose-Capillary: 222 mg/dL — ABNORMAL HIGH (ref 70–99)
Glucose-Capillary: 200 mg/dL — ABNORMAL HIGH (ref 70–99)

## 2020-04-05 SURGERY — TRANSPOSITION, VEIN, BASILIC
Anesthesia: Monitor Anesthesia Care | Laterality: Left

## 2020-04-05 MED ORDER — PROPOFOL 10 MG/ML IV BOLUS
INTRAVENOUS | Status: DC | PRN
  Administered 2020-04-05: 30 mg via INTRAVENOUS
  Administered 2020-04-05: 130 mg via INTRAVENOUS

## 2020-04-05 MED ORDER — CHLORHEXIDINE GLUCONATE 4 % EX LIQD: 60.0000 mL | Freq: Once | CUTANEOUS | Status: DC

## 2020-04-05 MED ORDER — OXYCODONE-ACETAMINOPHEN 5-325 MG PO TABS
ORAL_TABLET | ORAL | Status: AC
  Administered 2020-04-05: 1
  Filled 2020-04-05: qty 1

## 2020-04-05 MED ORDER — LACTATED RINGERS IV SOLN: INTRAVENOUS | Status: DC

## 2020-04-05 MED ORDER — LIDOCAINE HCL (PF) 1 % IJ SOLN
INTRAMUSCULAR | Status: AC
  Filled 2020-04-05: qty 30

## 2020-04-05 MED ORDER — PROPOFOL 10 MG/ML IV BOLUS
INTRAVENOUS | Status: AC
  Filled 2020-04-05: qty 20

## 2020-04-05 MED ORDER — HEPARIN SODIUM (PORCINE) 1000 UNIT/ML IJ SOLN
INTRAMUSCULAR | Status: AC
  Filled 2020-04-05: qty 1

## 2020-04-05 MED ORDER — LIDOCAINE 2% (20 MG/ML) 5 ML SYRINGE
INTRAMUSCULAR | Status: AC
  Filled 2020-04-05: qty 5

## 2020-04-05 MED ORDER — PHENYLEPHRINE 40 MCG/ML (10ML) SYRINGE FOR IV PUSH (FOR BLOOD PRESSURE SUPPORT)
PREFILLED_SYRINGE | INTRAVENOUS | Status: AC
  Filled 2020-04-05: qty 10

## 2020-04-05 MED ORDER — POTASSIUM CHLORIDE 10 MEQ/100ML IV SOLN
10.0000 meq | INTRAVENOUS | Status: AC
  Administered 2020-04-05: 10 meq via INTRAVENOUS
  Filled 2020-04-05: qty 100

## 2020-04-05 MED ORDER — IPRATROPIUM BROMIDE 0.02 % IN SOLN
RESPIRATORY_TRACT | Status: AC
  Filled 2020-04-05: qty 2.5

## 2020-04-05 MED ORDER — FENTANYL CITRATE (PF) 100 MCG/2ML IJ SOLN: 100.0000 ug | Freq: Once | INTRAMUSCULAR | Status: AC

## 2020-04-05 MED ORDER — OXYCODONE-ACETAMINOPHEN 10-325 MG PO TABS: 1.0000 | ORAL_TABLET | Freq: Four times a day (QID) | ORAL | 0 refills | Status: DC | PRN

## 2020-04-05 MED ORDER — ALBUTEROL SULFATE (2.5 MG/3ML) 0.083% IN NEBU
INHALATION_SOLUTION | RESPIRATORY_TRACT | Status: AC
  Filled 2020-04-05: qty 3

## 2020-04-05 MED ORDER — LIDOCAINE HCL (CARDIAC) PF 100 MG/5ML IV SOSY
PREFILLED_SYRINGE | INTRAVENOUS | Status: DC | PRN
  Administered 2020-04-05: 20 mg via INTRAVENOUS

## 2020-04-05 MED ORDER — FENTANYL CITRATE (PF) 100 MCG/2ML IJ SOLN
INTRAMUSCULAR | Status: AC
  Administered 2020-04-05: 100 ug via INTRAVENOUS
  Filled 2020-04-05: qty 2

## 2020-04-05 MED ORDER — THROMBIN 20000 UNITS EX SOLR
CUTANEOUS | Status: AC
  Filled 2020-04-05: qty 20000

## 2020-04-05 MED ORDER — HEPARIN SODIUM (PORCINE) 1000 UNIT/ML IJ SOLN
INTRAMUSCULAR | Status: DC | PRN
  Administered 2020-04-05: 10000 [IU] via INTRAVENOUS

## 2020-04-05 MED ORDER — MIDAZOLAM HCL 2 MG/2ML IJ SOLN
INTRAMUSCULAR | Status: AC
  Filled 2020-04-05: qty 2

## 2020-04-05 MED ORDER — POTASSIUM CHLORIDE 10 MEQ/100ML IV SOLN
INTRAVENOUS | Status: AC
  Administered 2020-04-05: 10 meq via INTRAVENOUS
  Filled 2020-04-05: qty 100

## 2020-04-05 MED ORDER — ONDANSETRON HCL 4 MG/2ML IJ SOLN
INTRAMUSCULAR | Status: DC | PRN
  Administered 2020-04-05: 4 mg via INTRAVENOUS

## 2020-04-05 MED ORDER — PHENYLEPHRINE 40 MCG/ML (10ML) SYRINGE FOR IV PUSH (FOR BLOOD PRESSURE SUPPORT)
PREFILLED_SYRINGE | INTRAVENOUS | Status: DC | PRN
  Administered 2020-04-05: 80 ug via INTRAVENOUS
  Administered 2020-04-05: 120 ug via INTRAVENOUS
  Administered 2020-04-05: 80 ug via INTRAVENOUS
  Administered 2020-04-05: 120 ug via INTRAVENOUS

## 2020-04-05 MED ORDER — ACETAMINOPHEN 500 MG PO TABS
1000.0000 mg | ORAL_TABLET | Freq: Once | ORAL | Status: AC
  Administered 2020-04-05: 1000 mg via ORAL
  Filled 2020-04-05: qty 2

## 2020-04-05 MED ORDER — ORAL CARE MOUTH RINSE: 15.0000 mL | Freq: Once | OROMUCOSAL | Status: AC

## 2020-04-05 MED ORDER — 0.9 % SODIUM CHLORIDE (POUR BTL) OPTIME
TOPICAL | Status: DC | PRN
  Administered 2020-04-05: 1000 mL

## 2020-04-05 MED ORDER — PROPOFOL 1000 MG/100ML IV EMUL
INTRAVENOUS | Status: AC
  Filled 2020-04-05: qty 100

## 2020-04-05 MED ORDER — FENTANYL CITRATE (PF) 250 MCG/5ML IJ SOLN
INTRAMUSCULAR | Status: AC
  Filled 2020-04-05: qty 5

## 2020-04-05 MED ORDER — PROTAMINE SULFATE 10 MG/ML IV SOLN
INTRAVENOUS | Status: DC | PRN
Start: 2020-04-05 — End: 2020-04-05
  Administered 2020-04-05: 50 mg via INTRAVENOUS

## 2020-04-05 MED ORDER — EPHEDRINE 5 MG/ML INJ
INTRAVENOUS | Status: AC
  Filled 2020-04-05: qty 10

## 2020-04-05 MED ORDER — EPHEDRINE SULFATE-NACL 50-0.9 MG/10ML-% IV SOSY
PREFILLED_SYRINGE | INTRAVENOUS | Status: DC | PRN
  Administered 2020-04-05 (×2): 10 mg via INTRAVENOUS
  Administered 2020-04-05: 5 mg via INTRAVENOUS
  Administered 2020-04-05: 10 mg via INTRAVENOUS

## 2020-04-05 MED ORDER — SODIUM CHLORIDE 0.9 % IV SOLN
INTRAVENOUS | Status: DC | PRN
  Administered 2020-04-05: 500 mL

## 2020-04-05 MED ORDER — IPRATROPIUM-ALBUTEROL 0.5-2.5 (3) MG/3ML IN SOLN: 3.0000 mL | Freq: Once | RESPIRATORY_TRACT | Status: DC

## 2020-04-05 MED ORDER — SODIUM CHLORIDE 0.9 % IV SOLN
INTRAVENOUS | Status: AC
  Filled 2020-04-05: qty 1.2

## 2020-04-05 MED ORDER — CEFAZOLIN SODIUM-DEXTROSE 2-4 GM/100ML-% IV SOLN
2.0000 g | INTRAVENOUS | Status: AC
  Administered 2020-04-05: 2 g via INTRAVENOUS
  Filled 2020-04-05: qty 100

## 2020-04-05 MED ORDER — OXYCODONE HCL 5 MG PO TABS
ORAL_TABLET | ORAL | Status: AC
  Administered 2020-04-05: 5 mg
  Filled 2020-04-05: qty 1

## 2020-04-05 MED ORDER — CHLORHEXIDINE GLUCONATE 0.12 % MT SOLN
15.0000 mL | Freq: Once | OROMUCOSAL | Status: AC
  Administered 2020-04-05: 15 mL via OROMUCOSAL
  Filled 2020-04-05: qty 15

## 2020-04-05 MED ORDER — SODIUM CHLORIDE 0.9 % IV SOLN: INTRAVENOUS | Status: DC

## 2020-04-05 MED ORDER — LIDOCAINE-EPINEPHRINE (PF) 1.5 %-1:200000 IJ SOLN
INTRAMUSCULAR | Status: DC | PRN
Start: 2020-04-05 — End: 2020-04-05
  Administered 2020-04-05: 30 mL via PERINEURAL

## 2020-04-05 SURGICAL SUPPLY — 46 items
ARMBAND PINK RESTRICT EXTREMIT (MISCELLANEOUS) ×3 IMPLANT
CANISTER SUCT 3000ML PPV (MISCELLANEOUS) ×3 IMPLANT
CANNULA VESSEL 3MM 2 BLNT TIP (CANNULA) ×3 IMPLANT
CLIP VESOCCLUDE MED 24/CT (CLIP) ×3 IMPLANT
CLIP VESOCCLUDE SM WIDE 24/CT (CLIP) ×3 IMPLANT
COUNTER NEEDLE 20 DBL MAG RED (NEEDLE) ×3 IMPLANT
COVER PROBE W GEL 5X96 (DRAPES) ×6 IMPLANT
COVER WAND RF STERILE (DRAPES) ×3 IMPLANT
DECANTER SPIKE VIAL GLASS SM (MISCELLANEOUS) ×3 IMPLANT
DERMABOND ADVANCED (GAUZE/BANDAGES/DRESSINGS) ×2
DERMABOND ADVANCED .7 DNX12 (GAUZE/BANDAGES/DRESSINGS) ×1 IMPLANT
DRAIN PENROSE 1/4X12 LTX STRL (WOUND CARE) IMPLANT
DRAPE HALF SHEET 40X57 (DRAPES) ×3 IMPLANT
ELECT REM PT RETURN 9FT ADLT (ELECTROSURGICAL) ×3
ELECTRODE REM PT RTRN 9FT ADLT (ELECTROSURGICAL) ×1 IMPLANT
GLOVE BIO SURGEON STRL SZ 6.5 (GLOVE) ×6 IMPLANT
GLOVE BIO SURGEON STRL SZ7 (GLOVE) ×6 IMPLANT
GLOVE BIO SURGEON STRL SZ7.5 (GLOVE) ×3 IMPLANT
GLOVE BIO SURGEONS STRL SZ 6.5 (GLOVE) ×3
GLOVE BIOGEL PI IND STRL 8 (GLOVE) ×2 IMPLANT
GLOVE BIOGEL PI INDICATOR 8 (GLOVE) ×4
GLOVE SURG SS PI 8.0 STRL IVOR (GLOVE) ×3 IMPLANT
GOWN STRL REUS W/ TWL LRG LVL3 (GOWN DISPOSABLE) ×3 IMPLANT
GOWN STRL REUS W/TWL LRG LVL3 (GOWN DISPOSABLE) ×6
HEMOSTAT HEMOBLAST BELLOWS (HEMOSTASIS) IMPLANT
HEMOSTAT SPONGE AVITENE ULTRA (HEMOSTASIS) IMPLANT
KIT BASIN OR (CUSTOM PROCEDURE TRAY) ×3 IMPLANT
KIT TURNOVER KIT B (KITS) ×3 IMPLANT
LOOP VESSEL MINI RED (MISCELLANEOUS) IMPLANT
NEEDLE HYPO 25GX1X1/2 BEV (NEEDLE) ×3 IMPLANT
NS IRRIG 1000ML POUR BTL (IV SOLUTION) ×3 IMPLANT
PACK CV ACCESS (CUSTOM PROCEDURE TRAY) ×3 IMPLANT
PAD ARMBOARD 7.5X6 YLW CONV (MISCELLANEOUS) IMPLANT
SUT PROLENE 6 0 BV (SUTURE) ×6 IMPLANT
SUT PROLENE 6 0 CC 1 (SUTURE) IMPLANT
SUT PROLENE 7 0 BV 1 (SUTURE) ×3 IMPLANT
SUT SILK 2 0 SH (SUTURE) IMPLANT
SUT SILK 3 0 TIES 17X18 (SUTURE) ×2
SUT SILK 3-0 18XBRD TIE BLK (SUTURE) ×1 IMPLANT
SUT VIC AB 3-0 SH 27 (SUTURE) ×4
SUT VIC AB 3-0 SH 27X BRD (SUTURE) ×2 IMPLANT
SUT VICRYL 4-0 PS2 18IN ABS (SUTURE) ×3 IMPLANT
SYR CONTROL 10ML LL (SYRINGE) ×3 IMPLANT
TOWEL GREEN STERILE (TOWEL DISPOSABLE) ×3 IMPLANT
UNDERPAD 30X36 HEAVY ABSORB (UNDERPADS AND DIAPERS) ×3 IMPLANT
WATER STERILE IRR 1000ML POUR (IV SOLUTION) ×3 IMPLANT

## 2020-04-05 NOTE — Interval H&P Note (Signed)
History and Physical Interval Note:  04/05/2020 10:42 AM  [name] [name]  has presented today for surgery, with the diagnosis of END STAGE RENAL DISEASE.  The various methods of treatment have been discussed with the patient and family. After consideration of risks, benefits and other options for treatment, the patient has consented to  Procedure(s): BASILIC VEIN TRANSPOSITION (Left) as a surgical intervention.  The patient's history has been reviewed, patient examined, no change in status, stable for surgery.  I have reviewed the patient's chart and labs.  Questions were answered to the patient's satisfaction.     Deitra Mayo

## 2020-04-05 NOTE — Anesthesia Procedure Notes (Signed)
Anesthesia Regional Block: Supraclavicular block   Pre-Anesthetic Checklist: ,, timeout performed, Correct Patient, Correct Site, Correct Laterality, Correct Procedure, Correct Position, site marked, Risks and benefits discussed,  Surgical consent,  Pre-op evaluation,  At surgeon's request and post-op pain management  Laterality: Left  Prep: Maximum Sterile Barrier Precautions used, chloraprep       Needles:  Injection technique: Single-shot  Needle Type: Echogenic Stimulator Needle     Needle Length: 9cm  Needle Gauge: 22     Additional Needles:   Procedures:,,,, ultrasound used (permanent image in chart),,,,  Narrative:  Start time: 04/05/2020 8:45 AM End time: 04/05/2020 8:50 AM Injection made incrementally with aspirations every 5 mL.  Performed by: Personally  Anesthesiologist: Pervis Hocking, DO  Additional Notes: Monitors applied. No increased pain on injection. No increased resistance to injection. Injection made in 5cc increments. Good needle visualization. Patient tolerated procedure well.

## 2020-04-05 NOTE — Transfer of Care (Signed)
Immediate Anesthesia Transfer of Care Note  Patient: [name] [name]  Procedure(s) Performed: BASILIC VEIN TRANSPOSITION (Left )  Patient Location: PACU  Anesthesia Type:General  Level of Consciousness: drowsy and patient cooperative  Airway & Oxygen Therapy: Patient Spontanous Breathing and Patient connected to nasal cannula oxygen  Post-op Assessment: Report given to RN  Post vital signs: Reviewed  Last Vitals:  Vitals Value Taken Time  BP 148/71 04/05/20 1318  Temp    Pulse 64 04/05/20 1319  Resp 20 04/05/20 1319  SpO2 95 % 04/05/20 1319  Vitals shown include unvalidated device data.  Last Pain:  Vitals:   04/05/20 0738  TempSrc: Temporal         Complications: No complications documented.

## 2020-04-05 NOTE — Progress Notes (Signed)
Dr. Doroteo Glassman made aware of patient's potassium.

## 2020-04-05 NOTE — Anesthesia Postprocedure Evaluation (Signed)
Anesthesia Post Note  Patient: [name] [name]  Procedure(s) Performed: BASILIC VEIN TRANSPOSITION (Left )     Patient location during evaluation: PACU Anesthesia Type: Regional and General Level of consciousness: awake and alert, oriented and patient cooperative Pain management: pain level controlled Vital Signs Assessment: post-procedure vital signs reviewed and stable Respiratory status: spontaneous breathing, nonlabored ventilation and respiratory function stable Cardiovascular status: blood pressure returned to baseline and stable Postop Assessment: no apparent nausea or vomiting Anesthetic complications: no   No complications documented.  Last Vitals:  Vitals:   04/05/20 0945 04/05/20 1323  BP: (!) 114/38 (!) 148/71  Pulse: 60   Resp: (!) 29   Temp:  36.4 C  SpO2: 97%     Last Pain:  Vitals:   04/05/20 0738  TempSrc: Temporal                 Pervis Hocking

## 2020-04-05 NOTE — Progress Notes (Signed)
Spoke with dr Doroteo Glassman re: cbg 200, will not treat/ higher preop

## 2020-04-05 NOTE — Discharge Instructions (Signed)
° °  [facility] ° °Discharge Instructions ° °AV Fistula or Graft Surgery for Dialysis Access ° °Please refer to the following instructions for your post-procedure care. Your surgeon or physician assistant will discuss any changes with you. ° °Activity ° °You may drive the day following your surgery, if you are comfortable and no longer taking prescription pain medication. Resume full activity as the soreness in your incision resolves. ° °Bathing/Showering ° °You may shower after you go home. Keep your incision dry for 48 hours. Do not soak in a bathtub, hot tub, or swim until the incision heals completely. You may not shower if you have a hemodialysis catheter. ° °Incision Care ° °Clean your incision with mild soap and water after 48 hours. Pat the area dry with a clean towel. You do not need a bandage unless otherwise instructed. Do not apply any ointments or creams to your incision. You may have skin glue on your incision. Do not peel it off. It will come off on its own in about one week. Your arm may swell a bit after surgery. To reduce swelling use pillows to elevate your arm so it is above your heart. Your doctor will tell you if you need to lightly wrap your arm with an ACE bandage. ° °Diet ° °Resume your normal diet. There are not special food restrictions following this procedure. In order to heal from your surgery, it is CRITICAL to get adequate nutrition. Your body requires vitamins, minerals, and protein. Vegetables are the best source of vitamins and minerals. Vegetables also provide the perfect balance of protein. Processed food has little nutritional value, so try to avoid this. ° °Medications ° °Resume taking all of your medications. If your incision is causing pain, you may take over-the counter pain relievers such as acetaminophen (Tylenol). If you were prescribed a stronger pain medication, please be aware these medications can cause nausea and constipation. Prevent  nausea by taking the medication with a snack or meal. Avoid constipation by drinking plenty of fluids and eating foods with high amount of fiber, such as fruits, vegetables, and grains. Do not take Tylenol if you are taking prescription pain medications. ° ° ° ° °Follow up °Your surgeon may want to see you in the office following your access surgery. If so, this will be arranged at the time of your surgery. ° °Please call us immediately for any of the following conditions: ° °Increased pain, redness, drainage (pus) from your incision site °Fever of 101 degrees or higher °Severe or worsening pain at your incision site °Hand pain or numbness. ° °Reduce your risk of vascular disease: ° °Stop smoking. If you would like help, call [facility] at 1-800-QUIT-NOW (1-800-784-8669) or [place] [facility] at 336-586-4000 ° °Manage your cholesterol °Maintain a desired weight °Control your diabetes °Keep your blood pressure down ° °Dialysis ° °It will take several weeks to several months for your new dialysis access to be ready for use. Your surgeon will determine when it is OK to use it. Your nephrologist will continue to direct your dialysis. You can continue to use your Permcath until your new access is ready for use. ° °If you have any questions, please call the office at [phone]. ° °

## 2020-04-05 NOTE — Op Note (Signed)
    NAME: [name] [name]    MRN: [id number] DOB: [birth year]    DATE OF OPERATION: 04/05/2020  PREOP DIAGNOSIS:    Stage IV chronic kidney disease  POSTOP DIAGNOSIS:    Same  PROCEDURE:    Left first stage basilic vein transposition  SURGEON: Judeth Cornfield. Scot Dock, MD  ASSIST: Arlee Muslim, PA  ANESTHESIA: General  EBL: Minimal  INDICATIONS:    [name] [name] is a 69 y.o. female who presents for new access.  She is not on dialysis.  Dr. Oneida Alar is also following her with peripheral vascular disease.  FINDINGS:   Below the antecubital level the vein was approximately 3 mm.  There was quite a distance between the brachial artery and the basilic vein so I had to mobilize the vein well below the antecubital crease to have adequate length for anastomosis to the brachial artery.  There were 2 branches and I selected the larger of the 2 branches.  At the completion there was a good thrill in the fistula and a palpable radial pulse.  TECHNIQUE:   The patient was taken to the operating room and received a general anesthetic.  The left arm was prepped and draped in usual sterile fashion.  I looked at the basilic vein myself with the SonoSite and it looked reasonable in size above the antecubital level.  He got smaller distally.  The artery was further lateral and there was quite a distance between the basilic vein and the brachial artery.  I therefore felt that I would have to mobilize the vein below the antecubital crease.  A longitudinal incision was made above the antecubital level and here the basilic vein was dissected free to where it bifurcated.  I chased both branches which were quite small.  Each was about 3 mm.  Branches were divided between clips and 3-0 silk ties.  I made a separate longitudinal incision beneath the antecubital level and again further dissected both of these branches of the basilic vein.  I selected the larger of the 2 branches.  The vein was clamped and  divided and t tied with a 3-0 silk.  I then gently distended the basilic vein and that was about a 3 mm vein.  I tunneled this over to the separate incision over the brachial artery which had been dissected free and had a good pulse.  The patient was heparinized.  The brachial artery was clamped proximally and distally and a longitudinal arteriotomy was made.  The vein was spatulated and sewn into side to the artery using continuous 6-0 Prolene suture.  At the completion there was a good thrill in the fistula and a palpable radial pulse.  Hemostasis was obtained in the wounds.  Each of the wounds was closed with a deep layer of 3-0 Vicryl and the skin closed with 4-0 Vicryl.  Dermabond was applied.  Patient tolerated the procedure well was transferred to the recovery room in stable condition.  All needle and sponge counts were correct.  Given the complexity of the case a first assistant was necessary in order to expedient the procedure and safely perform the technical aspects of the operation.  Deitra Mayo, MD, FACS [facility]  DATE OF DICTATION:   04/05/2020

## 2020-04-05 NOTE — Anesthesia Procedure Notes (Signed)
Procedure Name: LMA Insertion Date/Time: 04/05/2020 11:18 AM Performed by: Barrington Ellison, CRNA Pre-anesthesia Checklist: Patient identified, Emergency Drugs available, Suction available and Patient being monitored Patient Re-evaluated:Patient Re-evaluated prior to induction Oxygen Delivery Method: Circle System Utilized Preoxygenation: Pre-oxygenation with 100% oxygen Induction Type: IV induction Ventilation: Mask ventilation without difficulty LMA: LMA inserted LMA Size: 5.0 Number of attempts: 1 Placement Confirmation: positive ETCO2 Tube secured with: Tape Dental Injury: Teeth and Oropharynx as per pre-operative assessment

## 2020-04-06 ENCOUNTER — Encounter (HOSPITAL_COMMUNITY): Payer: Self-pay | Admitting: Vascular Surgery

## 2020-04-12 ENCOUNTER — Encounter (HOSPITAL_COMMUNITY): Payer: Medicare Other

## 2020-04-14 ENCOUNTER — Other Ambulatory Visit: Payer: Self-pay | Admitting: Internal Medicine

## 2020-04-14 DIAGNOSIS — Z1231 Encounter for screening mammogram for malignant neoplasm of breast: Secondary | ICD-10-CM

## 2020-04-15 ENCOUNTER — Encounter (HOSPITAL_COMMUNITY): Payer: Medicare Other

## 2020-04-22 ENCOUNTER — Encounter (HOSPITAL_COMMUNITY): Payer: Medicare Other

## 2020-04-22 ENCOUNTER — Other Ambulatory Visit: Payer: Self-pay

## 2020-04-22 ENCOUNTER — Encounter (HOSPITAL_COMMUNITY)
Admission: RE | Admit: 2020-04-22 | Discharge: 2020-04-22 | Disposition: A | Discharge status: Home / Self Care | Payer: Medicare Other | Source: Ambulatory Visit | Attending: Internal Medicine | Admitting: Internal Medicine

## 2020-04-22 VITALS — BP 117/56 | HR 79 | Temp 97.8°F | Resp 20

## 2020-04-22 DIAGNOSIS — N189 Chronic kidney disease, unspecified: Secondary | ICD-10-CM | POA: Insufficient documentation

## 2020-04-22 DIAGNOSIS — N184 Chronic kidney disease, stage 4 (severe): Secondary | ICD-10-CM | POA: Diagnosis present

## 2020-04-22 DIAGNOSIS — N179 Acute kidney failure, unspecified: Secondary | ICD-10-CM | POA: Diagnosis not present

## 2020-04-22 DIAGNOSIS — D631 Anemia in chronic kidney disease: Secondary | ICD-10-CM | POA: Insufficient documentation

## 2020-04-22 LAB — IRON AND TIBC
Iron: 58 ug/dL (ref 28–170)
Saturation Ratios: 15 % (ref 10.4–31.8)
TIBC: 392 ug/dL (ref 250–450)
UIBC: 334 ug/dL

## 2020-04-22 LAB — RENAL FUNCTION PANEL
Albumin: 3.6 g/dL (ref 3.5–5.0)
Anion gap: 14 (ref 5–15)
BUN: 141 mg/dL — ABNORMAL HIGH (ref 8–23)
CO2: 31 mmol/L (ref 22–32)
Calcium: 10.3 mg/dL (ref 8.9–10.3)
Chloride: 90 mmol/L — ABNORMAL LOW (ref 98–111)
Creatinine, Ser: 3.43 mg/dL — ABNORMAL HIGH (ref 0.44–1.00)
GFR calc Af Amer: 15 mL/min — ABNORMAL LOW (ref >60)
GFR calc non Af Amer: 13 mL/min — ABNORMAL LOW (ref >60)
Glucose, Bld: 189 mg/dL — ABNORMAL HIGH (ref 70–99)
Phosphorus: 3.7 mg/dL (ref 2.5–4.6)
Potassium: 3.2 mmol/L — ABNORMAL LOW (ref 3.5–5.1)
Sodium: 135 mmol/L (ref 135–145)

## 2020-04-22 LAB — FERRITIN: Ferritin: 184 ng/mL (ref 11–307)

## 2020-04-22 LAB — POCT HEMOGLOBIN-HEMACUE: Hemoglobin: 11.1 g/dL — ABNORMAL LOW (ref 12.0–15.0)

## 2020-04-22 MED ORDER — EPOETIN ALFA-EPBX 40000 UNIT/ML IJ SOLN: 40000.0000 [IU] | INTRAMUSCULAR | Status: DC

## 2020-04-22 MED ORDER — EPOETIN ALFA-EPBX 40000 UNIT/ML IJ SOLN
INTRAMUSCULAR | Status: AC
  Filled 2020-04-22: qty 1

## 2020-04-23 LAB — PTH, INTACT AND CALCIUM
Calcium, Total (PTH): 10.8 mg/dL — ABNORMAL HIGH (ref 8.7–10.3)
PTH: 64 pg/mL (ref 15–65)

## 2020-04-28 ENCOUNTER — Other Ambulatory Visit: Payer: Self-pay

## 2020-04-28 DIAGNOSIS — N184 Chronic kidney disease, stage 4 (severe): Secondary | ICD-10-CM

## 2020-05-12 ENCOUNTER — Encounter: Payer: Self-pay | Admitting: Vascular Surgery

## 2020-05-12 ENCOUNTER — Ambulatory Visit (HOSPITAL_COMMUNITY)
Admission: RE | Admit: 2020-05-12 | Discharge: 2020-05-12 | Disposition: A | Discharge status: Home / Self Care | Payer: Medicare Other | Source: Ambulatory Visit | Attending: Vascular Surgery | Admitting: Vascular Surgery

## 2020-05-12 ENCOUNTER — Other Ambulatory Visit: Payer: Self-pay

## 2020-05-12 ENCOUNTER — Ambulatory Visit (INDEPENDENT_AMBULATORY_CARE_PROVIDER_SITE_OTHER): Payer: Self-pay | Admitting: Vascular Surgery

## 2020-05-12 VITALS — BP 165/82 | HR 66 | Temp 97.5°F | Resp 20 | Ht 64.0 in | Wt 240.0 lb

## 2020-05-12 DIAGNOSIS — N184 Chronic kidney disease, stage 4 (severe): Secondary | ICD-10-CM | POA: Diagnosis present

## 2020-05-12 NOTE — Progress Notes (Signed)
Patient is a 69 year old female who returns for postoperative follow-up today.  She recently had a first stage basilic vein fistula placed by my partner Dr. Scot Dock.  She reports no numbness or tingling in her hand currently.  She is not currently on dialysis.  She also has known peripheral arterial disease with prior official femoral artery stenting.  ABIs were 0.8 on the right 0.7 on the left in June of this year.  Physical exam:  Vitals:   05/12/20 1521  BP: (!) 165/82  Pulse: 66  Resp: 20  Temp: (!) 97.5 F (36.4 C)  SpO2: 98%  Weight: 240 lb (108.9 kg)  Height: 5\' 4"  (1.626 m)    Extremities: Palpable thrill left arm AV fistula at the antecubital region no palpable radial pulse hand is pink and warm  Data: Patient had a duplex ultrasound of her AV fistula today.  This showed a competing branch in the midportion and the fistula was about 5 mm in diameter.  There was no significant narrowing.  Assessment: Maturing AV fistula left arm.  Would like to see diameter increase a little bit more before we do the second stage portion of her operation especially in light of the fact that she is not currently on hemodialysis.  Plan: Patient will follow up with me in 4 to 6 weeks for repeat duplex exam.  At that time we will consider whether or not to do second stage as well as sidebranch ligation.  [name], MD [facility] Office: [phone]

## 2020-05-13 ENCOUNTER — Other Ambulatory Visit: Payer: Self-pay | Admitting: *Deleted

## 2020-05-13 DIAGNOSIS — N184 Chronic kidney disease, stage 4 (severe): Secondary | ICD-10-CM

## 2020-05-16 ENCOUNTER — Ambulatory Visit: Payer: Medicare Other

## 2020-05-20 ENCOUNTER — Other Ambulatory Visit: Payer: Self-pay

## 2020-05-20 ENCOUNTER — Ambulatory Visit (HOSPITAL_COMMUNITY)
Admission: RE | Admit: 2020-05-20 | Discharge: 2020-05-20 | Disposition: A | Discharge status: Home / Self Care | Payer: Medicare Other | Source: Ambulatory Visit | Attending: Internal Medicine | Admitting: Internal Medicine

## 2020-05-20 VITALS — BP 120/65 | HR 79 | Temp 97.5°F | Resp 20

## 2020-05-20 DIAGNOSIS — N179 Acute kidney failure, unspecified: Secondary | ICD-10-CM | POA: Diagnosis not present

## 2020-05-20 DIAGNOSIS — N184 Chronic kidney disease, stage 4 (severe): Secondary | ICD-10-CM | POA: Diagnosis not present

## 2020-05-20 DIAGNOSIS — I130 Hypertensive heart and chronic kidney disease with heart failure and stage 1 through stage 4 chronic kidney disease, or unspecified chronic kidney disease: Secondary | ICD-10-CM | POA: Insufficient documentation

## 2020-05-20 LAB — FERRITIN: Ferritin: 51 ng/mL (ref 11–307)

## 2020-05-20 LAB — IRON AND TIBC
Iron: 57 ug/dL (ref 28–170)
Saturation Ratios: 15 % (ref 10.4–31.8)
TIBC: 384 ug/dL (ref 250–450)
UIBC: 327 ug/dL

## 2020-05-20 LAB — POCT HEMOGLOBIN-HEMACUE: Hemoglobin: 9.9 g/dL — ABNORMAL LOW (ref 12.0–15.0)

## 2020-05-20 MED ORDER — EPOETIN ALFA-EPBX 40000 UNIT/ML IJ SOLN
INTRAMUSCULAR | Status: AC
  Filled 2020-05-20: qty 1

## 2020-05-20 MED ORDER — EPOETIN ALFA-EPBX 40000 UNIT/ML IJ SOLN
40000.0000 [IU] | INTRAMUSCULAR | Status: DC
  Administered 2020-05-20: 40000 [IU] via SUBCUTANEOUS

## 2020-05-27 ENCOUNTER — Ambulatory Visit: Payer: Medicare Other

## 2020-06-09 ENCOUNTER — Ambulatory Visit: Payer: Medicare Other

## 2020-06-17 ENCOUNTER — Ambulatory Visit (HOSPITAL_COMMUNITY)
Admission: RE | Admit: 2020-06-17 | Discharge: 2020-06-17 | Disposition: A | Discharge status: Home / Self Care | Payer: Medicare Other | Source: Ambulatory Visit | Attending: Internal Medicine | Admitting: Internal Medicine

## 2020-06-17 ENCOUNTER — Other Ambulatory Visit: Payer: Self-pay

## 2020-06-17 VITALS — BP 155/64 | HR 93 | Temp 97.5°F | Resp 18

## 2020-06-17 DIAGNOSIS — N184 Chronic kidney disease, stage 4 (severe): Secondary | ICD-10-CM | POA: Diagnosis not present

## 2020-06-17 DIAGNOSIS — N179 Acute kidney failure, unspecified: Secondary | ICD-10-CM | POA: Diagnosis present

## 2020-06-17 LAB — POCT HEMOGLOBIN-HEMACUE: Hemoglobin: 8.1 g/dL — ABNORMAL LOW (ref 12.0–15.0)

## 2020-06-17 LAB — IRON AND TIBC
Iron: 21 ug/dL — ABNORMAL LOW (ref 28–170)
Saturation Ratios: 5 % — ABNORMAL LOW (ref 10.4–31.8)
TIBC: 409 ug/dL (ref 250–450)
UIBC: 388 ug/dL

## 2020-06-17 LAB — FERRITIN: Ferritin: 23 ng/mL (ref 11–307)

## 2020-06-17 MED ORDER — EPOETIN ALFA-EPBX 40000 UNIT/ML IJ SOLN
INTRAMUSCULAR | Status: AC
  Administered 2020-06-17: 40000 [IU]
  Filled 2020-06-17: qty 1

## 2020-06-17 MED ORDER — EPOETIN ALFA-EPBX 40000 UNIT/ML IJ SOLN: 40000.0000 [IU] | INTRAMUSCULAR | Status: DC

## 2020-06-30 ENCOUNTER — Telehealth: Payer: Self-pay | Admitting: *Deleted

## 2020-06-30 ENCOUNTER — Ambulatory Visit (INDEPENDENT_AMBULATORY_CARE_PROVIDER_SITE_OTHER): Payer: Self-pay | Admitting: Vascular Surgery

## 2020-06-30 ENCOUNTER — Encounter: Payer: Self-pay | Admitting: Vascular Surgery

## 2020-06-30 ENCOUNTER — Other Ambulatory Visit: Payer: Self-pay

## 2020-06-30 ENCOUNTER — Ambulatory Visit (HOSPITAL_COMMUNITY)
Admission: RE | Admit: 2020-06-30 | Discharge: 2020-06-30 | Disposition: A | Discharge status: Home / Self Care | Payer: Medicare Other | Source: Ambulatory Visit | Attending: Vascular Surgery | Admitting: Vascular Surgery

## 2020-06-30 VITALS — BP 154/82 | HR 64 | Temp 97.4°F | Resp 20 | Ht 64.0 in | Wt 243.0 lb

## 2020-06-30 DIAGNOSIS — N184 Chronic kidney disease, stage 4 (severe): Secondary | ICD-10-CM

## 2020-06-30 NOTE — Progress Notes (Signed)
Patient is a 20 fistula June 2021 by Dr. Scot Dock.  She returns today for further follow-up.  She is not currently on hemodialysis.  She apparently has follow-up with her nephrologist Dr. [name] in the near future.  She is overall fairly deconditioned and is in a wheelchair today.  She reports no numbness or tingling in her hand.  She has no swelling in the arm.  Incision is well-healed.  Physical exam:  Vitals:   06/30/20 1510  BP: (!) 154/82  Pulse: 64  Resp: 20  Temp: (!) 97.4 F (36.3 C)  SpO2: 96%  Weight: 243 lb (110.2 kg)  Height: 5\' 4"  (1.626 m)    Extremities: Palpable thrill in fistula well-healed incision left arm  Data: Patient had a duplex ultrasound today which shows that the fistula is 3 mm in the proximal section dilating to 6 mm in the mid section and 3 mm in the distal section.  There were some sidebranches.  There was no obvious narrowing.  Assessment: Left basilic fistula still fairly small in diameter but not currently on hemodialysis.  If she approaches imminent dialysis would most likely need a fistulogram to determine if there is any narrowing and what intervention would be necessary to bring the fistula to maturity.  I would not perform the second stage portion of this until she approaches more imminent need for dialysis.  She will follow up with me in 6 weeks time for repeat duplex scan.  Plan: See above  [name], MD [facility] Office: [phone]

## 2020-06-30 NOTE — Telephone Encounter (Signed)
A message was left, re: her follow up visit. 

## 2020-07-05 ENCOUNTER — Other Ambulatory Visit: Payer: Self-pay | Admitting: *Deleted

## 2020-07-05 DIAGNOSIS — N184 Chronic kidney disease, stage 4 (severe): Secondary | ICD-10-CM

## 2020-07-15 ENCOUNTER — Ambulatory Visit (HOSPITAL_COMMUNITY)
Admission: RE | Admit: 2020-07-15 | Discharge: 2020-07-15 | Disposition: A | Discharge status: Home / Self Care | Payer: Medicare Other | Source: Ambulatory Visit | Attending: Internal Medicine | Admitting: Internal Medicine

## 2020-07-15 ENCOUNTER — Other Ambulatory Visit: Payer: Self-pay

## 2020-07-15 VITALS — BP 116/46 | HR 73 | Temp 97.3°F | Resp 18

## 2020-07-15 DIAGNOSIS — N179 Acute kidney failure, unspecified: Secondary | ICD-10-CM | POA: Insufficient documentation

## 2020-07-15 DIAGNOSIS — N184 Chronic kidney disease, stage 4 (severe): Secondary | ICD-10-CM | POA: Diagnosis present

## 2020-07-15 LAB — IRON AND TIBC
Iron: 17 ug/dL — ABNORMAL LOW (ref 28–170)
Saturation Ratios: 4 % — ABNORMAL LOW (ref 10.4–31.8)
TIBC: 473 ug/dL — ABNORMAL HIGH (ref 250–450)
UIBC: 456 ug/dL

## 2020-07-15 LAB — FERRITIN: Ferritin: 13 ng/mL (ref 11–307)

## 2020-07-15 LAB — POCT HEMOGLOBIN-HEMACUE: Hemoglobin: 7.6 g/dL — ABNORMAL LOW (ref 12.0–15.0)

## 2020-07-15 MED ORDER — EPOETIN ALFA-EPBX 40000 UNIT/ML IJ SOLN: 40000.0000 [IU] | INTRAMUSCULAR | Status: DC

## 2020-07-15 MED ORDER — SODIUM CHLORIDE 0.9 % IV SOLN
510.0000 mg | INTRAVENOUS | Status: DC
  Administered 2020-07-15: 510 mg via INTRAVENOUS
  Filled 2020-07-15: qty 17

## 2020-07-15 MED ORDER — EPOETIN ALFA-EPBX 40000 UNIT/ML IJ SOLN
INTRAMUSCULAR | Status: AC
  Administered 2020-07-15: 40000 [IU] via SUBCUTANEOUS
  Filled 2020-07-15: qty 1

## 2020-07-15 NOTE — Progress Notes (Signed)
Called [facility] today and spoke with Angie.  Reported hemocue today of 7.6, first dose of feraheme being given.  No new orders received.

## 2020-07-22 ENCOUNTER — Encounter (HOSPITAL_COMMUNITY)
Admission: RE | Admit: 2020-07-22 | Discharge: 2020-07-22 | Disposition: A | Discharge status: Home / Self Care | Payer: Medicare Other | Source: Ambulatory Visit | Attending: Internal Medicine | Admitting: Internal Medicine

## 2020-07-22 ENCOUNTER — Other Ambulatory Visit: Payer: Self-pay

## 2020-07-22 DIAGNOSIS — N185 Chronic kidney disease, stage 5: Secondary | ICD-10-CM | POA: Insufficient documentation

## 2020-07-22 DIAGNOSIS — D631 Anemia in chronic kidney disease: Secondary | ICD-10-CM | POA: Insufficient documentation

## 2020-07-22 MED ORDER — SODIUM CHLORIDE 0.9 % IV SOLN
510.0000 mg | INTRAVENOUS | Status: DC
  Administered 2020-07-22: 510 mg via INTRAVENOUS
  Filled 2020-07-22: qty 510

## 2020-08-11 ENCOUNTER — Encounter (HOSPITAL_COMMUNITY): Payer: Medicare Other

## 2020-08-11 ENCOUNTER — Ambulatory Visit: Payer: Medicare Other | Admitting: Vascular Surgery

## 2020-08-12 ENCOUNTER — Other Ambulatory Visit: Payer: Self-pay

## 2020-08-12 ENCOUNTER — Ambulatory Visit (HOSPITAL_COMMUNITY)
Admission: RE | Admit: 2020-08-12 | Discharge: 2020-08-12 | Disposition: A | Discharge status: Home / Self Care | Payer: Medicare Other | Source: Ambulatory Visit | Attending: Internal Medicine | Admitting: Internal Medicine

## 2020-08-12 VITALS — BP 124/55 | HR 81 | Temp 98.0°F | Resp 18

## 2020-08-12 DIAGNOSIS — N179 Acute kidney failure, unspecified: Secondary | ICD-10-CM | POA: Diagnosis not present

## 2020-08-12 DIAGNOSIS — N184 Chronic kidney disease, stage 4 (severe): Secondary | ICD-10-CM | POA: Diagnosis present

## 2020-08-12 LAB — FERRITIN: Ferritin: 130 ng/mL (ref 11–307)

## 2020-08-12 LAB — IRON AND TIBC
Iron: 40 ug/dL (ref 28–170)
Saturation Ratios: 13 % (ref 10.4–31.8)
TIBC: 301 ug/dL (ref 250–450)
UIBC: 261 ug/dL

## 2020-08-12 LAB — POCT HEMOGLOBIN-HEMACUE: Hemoglobin: 7.5 g/dL — ABNORMAL LOW (ref 12.0–15.0)

## 2020-08-12 MED ORDER — EPOETIN ALFA-EPBX 40000 UNIT/ML IJ SOLN: 40000.0000 [IU] | INTRAMUSCULAR | Status: DC

## 2020-08-12 MED ORDER — EPOETIN ALFA-EPBX 40000 UNIT/ML IJ SOLN
INTRAMUSCULAR | Status: AC
  Administered 2020-08-12: 40000 [IU] via SUBCUTANEOUS
  Filled 2020-08-12: qty 1

## 2020-08-12 NOTE — Progress Notes (Signed)
Hemocue 7.5. Called [facility]. Spoke to [employer] and she informed Dr. [name] of low hemocue. Pt reports feeling tired and weak. No acute distress noted. Denies seeing blood in urine or with bowel movements. VSS. Change Retacrit from every 4 weeks to every 2 weeks and okay to continue with 40,000 units which was done/given per Dr. [name].

## 2020-08-26 ENCOUNTER — Ambulatory Visit (HOSPITAL_COMMUNITY)
Admission: RE | Admit: 2020-08-26 | Discharge: 2020-08-26 | Disposition: A | Discharge status: Home / Self Care | Payer: Medicare Other | Source: Ambulatory Visit | Attending: Internal Medicine | Admitting: Internal Medicine

## 2020-08-26 ENCOUNTER — Other Ambulatory Visit: Payer: Self-pay

## 2020-08-26 VITALS — BP 135/69 | HR 103 | Resp 18

## 2020-08-26 DIAGNOSIS — N179 Acute kidney failure, unspecified: Secondary | ICD-10-CM | POA: Diagnosis present

## 2020-08-26 DIAGNOSIS — N184 Chronic kidney disease, stage 4 (severe): Secondary | ICD-10-CM

## 2020-08-26 MED ORDER — EPOETIN ALFA-EPBX 40000 UNIT/ML IJ SOLN
INTRAMUSCULAR | Status: AC
  Administered 2020-08-26: 40000 [IU] via SUBCUTANEOUS
  Filled 2020-08-26: qty 1

## 2020-08-26 MED ORDER — EPOETIN ALFA-EPBX 40000 UNIT/ML IJ SOLN: 40000.0000 [IU] | INTRAMUSCULAR | Status: DC

## 2020-08-26 NOTE — Progress Notes (Signed)
Hemocue 7.6. Called [facility]. Spoke to Angie and reported to her that the patient is still tired and weak, no worse then 2 weeks ago, denies seeing blood in urine or bowel movements. No changes in orders received. I let the patient know if she felt worse this weekend to go to there ER to be seen.

## 2020-08-29 LAB — POCT HEMOGLOBIN-HEMACUE: Hemoglobin: 7.6 g/dL — ABNORMAL LOW (ref 12.0–15.0)

## 2020-09-09 ENCOUNTER — Encounter (HOSPITAL_COMMUNITY): Payer: Medicare Other

## 2020-09-09 ENCOUNTER — Ambulatory Visit (HOSPITAL_COMMUNITY)
Admission: RE | Admit: 2020-09-09 | Discharge: 2020-09-09 | Disposition: A | Discharge status: Home / Self Care | Payer: Medicare Other | Source: Ambulatory Visit | Attending: Internal Medicine | Admitting: Internal Medicine

## 2020-09-09 ENCOUNTER — Other Ambulatory Visit: Payer: Self-pay

## 2020-09-09 VITALS — BP 139/72 | HR 99 | Temp 97.3°F | Resp 18

## 2020-09-09 DIAGNOSIS — N184 Chronic kidney disease, stage 4 (severe): Secondary | ICD-10-CM | POA: Diagnosis present

## 2020-09-09 DIAGNOSIS — N179 Acute kidney failure, unspecified: Secondary | ICD-10-CM | POA: Diagnosis present

## 2020-09-09 LAB — IRON AND TIBC
Iron: 20 ug/dL — ABNORMAL LOW (ref 28–170)
Saturation Ratios: 5 % — ABNORMAL LOW (ref 10.4–31.8)
TIBC: 399 ug/dL (ref 250–450)
UIBC: 379 ug/dL

## 2020-09-09 LAB — FERRITIN: Ferritin: 20 ng/mL (ref 11–307)

## 2020-09-09 LAB — POCT HEMOGLOBIN-HEMACUE: Hemoglobin: 7.7 g/dL — ABNORMAL LOW (ref 12.0–15.0)

## 2020-09-09 MED ORDER — EPOETIN ALFA-EPBX 40000 UNIT/ML IJ SOLN
INTRAMUSCULAR | Status: AC
  Filled 2020-09-09: qty 1

## 2020-09-09 MED ORDER — EPOETIN ALFA-EPBX 10000 UNIT/ML IJ SOLN
INTRAMUSCULAR | Status: AC
  Filled 2020-09-09: qty 2

## 2020-09-09 MED ORDER — EPOETIN ALFA-EPBX 40000 UNIT/ML IJ SOLN
60000.0000 [IU] | INTRAMUSCULAR | Status: DC
  Administered 2020-09-09: 60000 [IU] via SUBCUTANEOUS

## 2020-09-09 MED ORDER — CLONIDINE HCL 0.1 MG PO TABS: 0.1000 mg | ORAL_TABLET | Freq: Once | ORAL | Status: DC | PRN

## 2020-09-09 NOTE — Progress Notes (Signed)
HGB 7.7 today, patient denied any signs of bleeding and not shortness of breath.  This is first time patient getting retacrit  60000 units.

## 2020-09-15 ENCOUNTER — Ambulatory Visit: Payer: Medicare Other | Admitting: Vascular Surgery

## 2020-09-15 ENCOUNTER — Encounter (HOSPITAL_COMMUNITY): Payer: Medicare Other

## 2020-09-22 ENCOUNTER — Other Ambulatory Visit (HOSPITAL_COMMUNITY): Payer: Self-pay

## 2020-09-23 ENCOUNTER — Encounter (HOSPITAL_COMMUNITY)
Admission: RE | Admit: 2020-09-23 | Discharge: 2020-09-23 | Disposition: A | Discharge status: Home / Self Care | Payer: Medicare Other | Source: Ambulatory Visit | Attending: Internal Medicine | Admitting: Internal Medicine

## 2020-09-23 ENCOUNTER — Other Ambulatory Visit: Payer: Self-pay

## 2020-09-23 VITALS — BP 122/64 | HR 99 | Temp 97.6°F | Resp 18

## 2020-09-23 DIAGNOSIS — N184 Chronic kidney disease, stage 4 (severe): Secondary | ICD-10-CM | POA: Insufficient documentation

## 2020-09-23 DIAGNOSIS — N179 Acute kidney failure, unspecified: Secondary | ICD-10-CM | POA: Diagnosis not present

## 2020-09-23 MED ORDER — EPOETIN ALFA-EPBX 40000 UNIT/ML IJ SOLN
60000.0000 [IU] | Freq: Once | INTRAMUSCULAR | Status: AC
  Administered 2020-09-23: 60000 [IU] via SUBCUTANEOUS

## 2020-09-23 MED ORDER — EPOETIN ALFA-EPBX 40000 UNIT/ML IJ SOLN
INTRAMUSCULAR | Status: AC
  Filled 2020-09-23: qty 1

## 2020-09-23 MED ORDER — EPOETIN ALFA-EPBX 10000 UNIT/ML IJ SOLN
INTRAMUSCULAR | Status: AC
  Filled 2020-09-23: qty 2

## 2020-09-23 NOTE — Progress Notes (Signed)
Hgb 7.7 today. Same as last visit (at which time they upped her dose to 60000 units) and pt states she is feeling the same with no signs of bleeding. Spoke with Angie at [facility] who spoke with [name] and we will continue with the same dose with today's injection.

## 2020-09-26 LAB — POCT HEMOGLOBIN-HEMACUE: Hemoglobin: 7.7 g/dL — ABNORMAL LOW (ref 12.0–15.0)

## 2020-10-05 ENCOUNTER — Other Ambulatory Visit (HOSPITAL_COMMUNITY): Payer: Self-pay | Admitting: *Deleted

## 2020-10-05 DIAGNOSIS — N184 Chronic kidney disease, stage 4 (severe): Secondary | ICD-10-CM

## 2020-10-06 ENCOUNTER — Encounter (HOSPITAL_COMMUNITY)
Admission: RE | Admit: 2020-10-06 | Discharge: 2020-10-06 | Disposition: A | Discharge status: Home / Self Care | Payer: Medicare Other | Source: Ambulatory Visit | Attending: Internal Medicine | Admitting: Internal Medicine

## 2020-10-06 ENCOUNTER — Other Ambulatory Visit: Payer: Self-pay

## 2020-10-06 DIAGNOSIS — N179 Acute kidney failure, unspecified: Secondary | ICD-10-CM | POA: Diagnosis not present

## 2020-10-06 DIAGNOSIS — N184 Chronic kidney disease, stage 4 (severe): Secondary | ICD-10-CM

## 2020-10-06 LAB — RENAL FUNCTION PANEL
Albumin: 3.3 g/dL — ABNORMAL LOW (ref 3.5–5.0)
Anion gap: 15 (ref 5–15)
BUN: 141 mg/dL — ABNORMAL HIGH (ref 8–23)
CO2: 26 mmol/L (ref 22–32)
Calcium: 8.9 mg/dL (ref 8.9–10.3)
Chloride: 94 mmol/L — ABNORMAL LOW (ref 98–111)
Creatinine, Ser: 3.48 mg/dL — ABNORMAL HIGH (ref 0.44–1.00)
GFR, Estimated: 14 mL/min — ABNORMAL LOW (ref >60)
Glucose, Bld: 151 mg/dL — ABNORMAL HIGH (ref 70–99)
Phosphorus: 4.3 mg/dL (ref 2.5–4.6)
Potassium: 3.2 mmol/L — ABNORMAL LOW (ref 3.5–5.1)
Sodium: 135 mmol/L (ref 135–145)

## 2020-10-06 LAB — POCT HEMOGLOBIN-HEMACUE: Hemoglobin: 7.3 g/dL — ABNORMAL LOW (ref 12.0–15.0)

## 2020-10-06 LAB — HEPATITIS B SURFACE ANTIBODY,QUALITATIVE: Hep B S Ab: NONREACTIVE

## 2020-10-06 MED ORDER — EPOETIN ALFA-EPBX 10000 UNIT/ML IJ SOLN
INTRAMUSCULAR | Status: AC
  Administered 2020-10-06: 20000 [IU] via SUBCUTANEOUS
  Filled 2020-10-06: qty 2

## 2020-10-06 MED ORDER — EPOETIN ALFA-EPBX 40000 UNIT/ML IJ SOLN
INTRAMUSCULAR | Status: AC
  Administered 2020-10-06: 40000 [IU] via SUBCUTANEOUS
  Filled 2020-10-06: qty 1

## 2020-10-06 MED ORDER — EPOETIN ALFA-EPBX 40000 UNIT/ML IJ SOLN: 60000.0000 [IU] | Freq: Once | INTRAMUSCULAR | Status: DC

## 2020-10-06 NOTE — Progress Notes (Signed)
Pt here for retacrit injection. Hemoglobin today 7.3. No new symptoms or signs of bleeding.  Office notified and we were instructed to give same dose of retacrit today

## 2020-10-07 LAB — PTH, INTACT AND CALCIUM
Calcium, Total (PTH): 9.2 mg/dL (ref 8.7–10.3)
PTH: 67 pg/mL — ABNORMAL HIGH (ref 15–65)

## 2020-10-17 ENCOUNTER — Inpatient Hospital Stay: Payer: Medicare Other | Attending: Hematology | Admitting: Hematology

## 2020-10-17 ENCOUNTER — Telehealth: Payer: Self-pay

## 2020-10-17 ENCOUNTER — Other Ambulatory Visit: Payer: Self-pay

## 2020-10-17 ENCOUNTER — Inpatient Hospital Stay: Payer: Medicare Other

## 2020-10-17 ENCOUNTER — Emergency Department (HOSPITAL_COMMUNITY)
Admission: EM | Admit: 2020-10-17 | Discharge: 2020-10-18 | Disposition: A | Discharge status: Home / Self Care | Payer: Medicare Other | Attending: Emergency Medicine | Admitting: Emergency Medicine

## 2020-10-17 ENCOUNTER — Encounter (HOSPITAL_COMMUNITY): Payer: Self-pay

## 2020-10-17 ENCOUNTER — Telehealth: Payer: Self-pay | Admitting: Hematology

## 2020-10-17 VITALS — BP 130/53 | HR 88 | Temp 97.8°F | Resp 18 | Ht 64.0 in

## 2020-10-17 DIAGNOSIS — I509 Heart failure, unspecified: Secondary | ICD-10-CM | POA: Diagnosis not present

## 2020-10-17 DIAGNOSIS — M109 Gout, unspecified: Secondary | ICD-10-CM | POA: Diagnosis not present

## 2020-10-17 DIAGNOSIS — J449 Chronic obstructive pulmonary disease, unspecified: Secondary | ICD-10-CM | POA: Insufficient documentation

## 2020-10-17 DIAGNOSIS — G4730 Sleep apnea, unspecified: Secondary | ICD-10-CM | POA: Diagnosis not present

## 2020-10-17 DIAGNOSIS — N186 End stage renal disease: Secondary | ICD-10-CM | POA: Insufficient documentation

## 2020-10-17 DIAGNOSIS — I130 Hypertensive heart and chronic kidney disease with heart failure and stage 1 through stage 4 chronic kidney disease, or unspecified chronic kidney disease: Secondary | ICD-10-CM | POA: Insufficient documentation

## 2020-10-17 DIAGNOSIS — Z8673 Personal history of transient ischemic attack (TIA), and cerebral infarction without residual deficits: Secondary | ICD-10-CM | POA: Diagnosis not present

## 2020-10-17 DIAGNOSIS — N184 Chronic kidney disease, stage 4 (severe): Secondary | ICD-10-CM

## 2020-10-17 DIAGNOSIS — F17210 Nicotine dependence, cigarettes, uncomplicated: Secondary | ICD-10-CM | POA: Diagnosis not present

## 2020-10-17 DIAGNOSIS — D631 Anemia in chronic kidney disease: Secondary | ICD-10-CM | POA: Insufficient documentation

## 2020-10-17 DIAGNOSIS — J45909 Unspecified asthma, uncomplicated: Secondary | ICD-10-CM | POA: Diagnosis not present

## 2020-10-17 DIAGNOSIS — Z7901 Long term (current) use of anticoagulants: Secondary | ICD-10-CM | POA: Insufficient documentation

## 2020-10-17 DIAGNOSIS — D649 Anemia, unspecified: Secondary | ICD-10-CM | POA: Insufficient documentation

## 2020-10-17 DIAGNOSIS — K219 Gastro-esophageal reflux disease without esophagitis: Secondary | ICD-10-CM | POA: Insufficient documentation

## 2020-10-17 DIAGNOSIS — Z79899 Other long term (current) drug therapy: Secondary | ICD-10-CM | POA: Insufficient documentation

## 2020-10-17 DIAGNOSIS — I10 Essential (primary) hypertension: Secondary | ICD-10-CM | POA: Insufficient documentation

## 2020-10-17 DIAGNOSIS — E669 Obesity, unspecified: Secondary | ICD-10-CM | POA: Diagnosis not present

## 2020-10-17 DIAGNOSIS — E039 Hypothyroidism, unspecified: Secondary | ICD-10-CM | POA: Diagnosis not present

## 2020-10-17 DIAGNOSIS — E785 Hyperlipidemia, unspecified: Secondary | ICD-10-CM | POA: Diagnosis not present

## 2020-10-17 DIAGNOSIS — I132 Hypertensive heart and chronic kidney disease with heart failure and with stage 5 chronic kidney disease, or end stage renal disease: Secondary | ICD-10-CM | POA: Diagnosis not present

## 2020-10-17 DIAGNOSIS — E1122 Type 2 diabetes mellitus with diabetic chronic kidney disease: Secondary | ICD-10-CM | POA: Diagnosis not present

## 2020-10-17 DIAGNOSIS — I5043 Acute on chronic combined systolic (congestive) and diastolic (congestive) heart failure: Secondary | ICD-10-CM | POA: Diagnosis not present

## 2020-10-17 DIAGNOSIS — R5383 Other fatigue: Secondary | ICD-10-CM | POA: Insufficient documentation

## 2020-10-17 DIAGNOSIS — R0602 Shortness of breath: Secondary | ICD-10-CM | POA: Insufficient documentation

## 2020-10-17 LAB — CBC WITH DIFFERENTIAL/PLATELET
Abs Immature Granulocytes: 0.13 10*3/uL — ABNORMAL HIGH (ref 0.00–0.07)
Basophils Absolute: 0 10*3/uL (ref 0.0–0.1)
Basophils Relative: 0 %
Eosinophils Absolute: 0.2 10*3/uL (ref 0.0–0.5)
Eosinophils Relative: 1 %
HCT: 23.5 % — ABNORMAL LOW (ref 36.0–46.0)
Hemoglobin: 6.9 g/dL — CL (ref 12.0–15.0)
Immature Granulocytes: 1 %
Lymphocytes Relative: 12 %
Lymphs Abs: 1.4 10*3/uL (ref 0.7–4.0)
MCH: 21.8 pg — ABNORMAL LOW (ref 26.0–34.0)
MCHC: 29.4 g/dL — ABNORMAL LOW (ref 30.0–36.0)
MCV: 74.1 fL — ABNORMAL LOW (ref 80.0–100.0)
Monocytes Absolute: 0.7 10*3/uL (ref 0.1–1.0)
Monocytes Relative: 6 %
Neutro Abs: 9.1 10*3/uL — ABNORMAL HIGH (ref 1.7–7.7)
Neutrophils Relative %: 80 %
Platelets: 405 10*3/uL — ABNORMAL HIGH (ref 150–400)
RBC: 3.17 MIL/uL — ABNORMAL LOW (ref 3.87–5.11)
RDW: 19.7 % — ABNORMAL HIGH (ref 11.5–15.5)
WBC: 11.5 10*3/uL — ABNORMAL HIGH (ref 4.0–10.5)
nRBC: 0 % (ref 0.0–0.2)

## 2020-10-17 LAB — CBC
HCT: 24.8 % — ABNORMAL LOW (ref 36.0–46.0)
Hemoglobin: 7 g/dL — ABNORMAL LOW (ref 12.0–15.0)
MCH: 21.3 pg — ABNORMAL LOW (ref 26.0–34.0)
MCHC: 28.2 g/dL — ABNORMAL LOW (ref 30.0–36.0)
MCV: 75.4 fL — ABNORMAL LOW (ref 80.0–100.0)
Platelets: 427 10*3/uL — ABNORMAL HIGH (ref 150–400)
RBC: 3.29 MIL/uL — ABNORMAL LOW (ref 3.87–5.11)
RDW: 19.9 % — ABNORMAL HIGH (ref 11.5–15.5)
WBC: 12.2 10*3/uL — ABNORMAL HIGH (ref 4.0–10.5)
nRBC: 0 % (ref 0.0–0.2)

## 2020-10-17 LAB — SEDIMENTATION RATE: Sed Rate: 50 mm/hr — ABNORMAL HIGH (ref 0–22)

## 2020-10-17 LAB — CMP (CANCER CENTER ONLY)
ALT: 11 U/L (ref 0–44)
AST: 14 U/L — ABNORMAL LOW (ref 15–41)
Albumin: 3.1 g/dL — ABNORMAL LOW (ref 3.5–5.0)
Alkaline Phosphatase: 76 U/L (ref 38–126)
Anion gap: 14 (ref 5–15)
BUN: 138 mg/dL — ABNORMAL HIGH (ref 8–23)
CO2: 29 mmol/L (ref 22–32)
Calcium: 9.3 mg/dL (ref 8.9–10.3)
Chloride: 92 mmol/L — ABNORMAL LOW (ref 98–111)
Creatinine: 3.51 mg/dL (ref 0.44–1.00)
GFR, Estimated: 13 mL/min — ABNORMAL LOW (ref >60)
Glucose, Bld: 190 mg/dL — ABNORMAL HIGH (ref 70–99)
Potassium: 3.4 mmol/L — ABNORMAL LOW (ref 3.5–5.1)
Sodium: 135 mmol/L (ref 135–145)
Total Bilirubin: 0.3 mg/dL (ref 0.3–1.2)
Total Protein: 7.2 g/dL (ref 6.5–8.1)

## 2020-10-17 LAB — COMPREHENSIVE METABOLIC PANEL
ALT: 15 U/L (ref 0–44)
AST: 17 U/L (ref 15–41)
Albumin: 3 g/dL — ABNORMAL LOW (ref 3.5–5.0)
Alkaline Phosphatase: 72 U/L (ref 38–126)
Anion gap: 16 — ABNORMAL HIGH (ref 5–15)
BUN: 151 mg/dL — ABNORMAL HIGH (ref 8–23)
CO2: 28 mmol/L (ref 22–32)
Calcium: 9.3 mg/dL (ref 8.9–10.3)
Chloride: 90 mmol/L — ABNORMAL LOW (ref 98–111)
Creatinine, Ser: 3.57 mg/dL — ABNORMAL HIGH (ref 0.44–1.00)
GFR, Estimated: 13 mL/min — ABNORMAL LOW (ref >60)
Glucose, Bld: 197 mg/dL — ABNORMAL HIGH (ref 70–99)
Potassium: 3.4 mmol/L — ABNORMAL LOW (ref 3.5–5.1)
Sodium: 134 mmol/L — ABNORMAL LOW (ref 135–145)
Total Bilirubin: 0.5 mg/dL (ref 0.3–1.2)
Total Protein: 6.9 g/dL (ref 6.5–8.1)

## 2020-10-17 LAB — PREPARE RBC (CROSSMATCH)

## 2020-10-17 LAB — IRON AND TIBC
Iron: 15 ug/dL — ABNORMAL LOW (ref 41–142)
Saturation Ratios: 4 % — ABNORMAL LOW (ref 21–57)
TIBC: 370 ug/dL (ref 236–444)
UIBC: 355 ug/dL (ref 120–384)

## 2020-10-17 LAB — VITAMIN B12: Vitamin B-12: 1082 pg/mL — ABNORMAL HIGH (ref 180–914)

## 2020-10-17 LAB — SAMPLE TO BLOOD BANK

## 2020-10-17 LAB — POC OCCULT BLOOD, ED: Fecal Occult Bld: NEGATIVE

## 2020-10-17 LAB — FERRITIN: Ferritin: 13 ng/mL (ref 11–307)

## 2020-10-17 MED ORDER — SODIUM CHLORIDE 0.9% IV SOLUTION: Freq: Once | INTRAVENOUS | Status: AC

## 2020-10-17 NOTE — Telephone Encounter (Signed)
CRITICAL VALUE STICKER  CRITICAL VALUE: Creatinine 3.51  RECEIVER (on-site recipient of call):Oluwatoni Rotunno Rosana Hoes, [place] NOTIFIED: 10/17/2020 @ 1344  MESSENGER (representative from lab): Martina Sinner  MD NOTIFIED:  Dr. Sullivan Lone  TIME OF NOTIFICATION: 9024  RESPONSE: Acknowledged. No further orders received.

## 2020-10-17 NOTE — Progress Notes (Signed)
HEMATOLOGY/ONCOLOGY CONSULTATION NOTE  Date of Service: 10/17/2020  Patient Care Team: [name], MD as PCP - General (Internal Medicine) Larey Dresser, MD as PCP - Advanced Heart Failure (Cardiology) Lorretta Harp, MD as Consulting Physician (Cardiology) Estanislado Emms, MD (Inactive) as Consulting Physician (Nephrology)/[name]  CHIEF COMPLAINTS/PURPOSE OF CONSULTATION:  Anemia  HISTORY OF PRESENTING ILLNESS:   [name] [name] is a wonderful 70 y.o. female who has been referred to [name] by Conni Elliot RMA   for evaluation and management of anemia.   Patient has had worsening anemia in the setting of ESRD awaiting initiation of hemodialysis. She is followed by [name] for nephrology.She was referred to [name] for evaluation and mx of anemia.  Most recent lab results CBC is as follows: all values are 10/06/2020 Hgb at 7.3  Patient notes increased fatigue and DOE but no overt chest pain or SOB.  Patient notes she might have previously received IV Iron but is not sure. NO weight loss. Does note that she might have received IV iron previously but not sure about EPO.  MEDICAL HISTORY:  Past Medical History:  Diagnosis Date  . Anemia   . Arthritis   . Asthma   . Bronchitis   . CHF (congestive heart failure) ([place])   . Chronic kidney disease   . Chronic kidney disease   . Chronic pain syndrome   . COPD (chronic obstructive pulmonary disease) ([employer])   . Cramping of feet   . Diabetes mellitus   . Dyspnea   . Full dentures   . GERD (gastroesophageal reflux disease)   . Gout   . Headache   . History of hiatal hernia   . History of hip replacement, total   . History of transfusion   . Hx MRSA infection    abscess left groin  . Hx of cardiac cath 06/2014  . Hyperlipidemia   . Hypertension   . Hypothyroid   . 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 Morbidly obese ([place])   . Peripheral vascular disease ([place])   . Positive cardiac stress test 12/2013   anterior and  lateral ischemia on Myoview  . Sleep apnea    UNABLE TO TOLERATE C PAP  . Stress incontinence   . Thyroid disease   . TIA (transient ischemic attack)     X2 NO RESIDUAL PROBLEMS  . Wears glasses     SURGICAL HISTORY: Past Surgical History:  Procedure Laterality Date  . ABDOMINAL HYSTERECTOMY    . BACK SURGERY  2020  . BASCILIC VEIN TRANSPOSITION Left 04/05/2020   Procedure: BASILIC VEIN TRANSPOSITION FIRST STAGE;  Surgeon: Angelia Mould, MD;  Location: [facility];  Service: Vascular;  Laterality: Left;  . COLON SURGERY  1995   DUE TO POLYP  . DILATION AND CURETTAGE OF UTERUS    . FOREARM FRACTURE SURGERY     Left arm  . HERNIA REPAIR     w/ mesh  . INCISION AND DRAINAGE ABSCESS Left 02/04/2013   Procedure: INCISION AND DRAINAGE LEFT BUTTOCK ABSCESS; INCISION AND DRAINAGE LEFT BREAST ABSCESS;  Surgeon: Harl Bowie, MD;  Location: [facility];  Service: General;  Laterality: Left;  . INCISION AND DRAINAGE ABSCESS N/A 02/12/2013   Procedure: INCISION AND DEBRIDEMENT BUTTOCK WOUND ;  Surgeon: [name]. Georgette Dover, MD;  Location: [facility];  Service: General;  Laterality: N/A;  . INCISION AND DRAINAGE ABSCESS N/A 02/14/2013   Procedure: INCISION AND DRAINAGE/DRESSING CHANGE;  Surgeon: Nathaneil Canary  Lynann Beaver, MD;  Location: [facility];  Service: General;  Laterality: N/A;  . INCISION AND DRAINAGE ABSCESS N/A 03/21/2015   Procedure: INCISION AND DRAINAGE PUBIC ABSCESS;  Surgeon: Excell Seltzer, MD;  Location: [facility];  Service: General;  Laterality: N/A;  . INCISION AND DRAINAGE PERIRECTAL ABSCESS Left 02/10/2013   Procedure: IRRIGATION AND DEBRIDEMENT OF BUTTOCK/PERINEAL ABSCESS;  Surgeon: [name]. Georgette Dover, MD;  Location: [place];  Service: General;  Laterality: Left;  . INCISION AND DRAINAGE PERIRECTAL ABSCESS N/A 02/16/2013   Procedure: IRRIGATION AND DEBRIDEMENT PERINEAL ABSCESS;  Surgeon: Zenovia Jarred, MD;  Location: [facility];  Service: General;  Laterality: N/A;  . IRRIGATION AND DEBRIDEMENT ABSCESS  N/A 02/06/2013   Procedure: IRRIGATION AND DEBRIDEMENT BUTTOCK ABSCESS AND DRESSING CHANGE;  Surgeon: Harl Bowie, MD;  Location: [place];  Service: General;  Laterality: N/A;  . IRRIGATION AND DEBRIDEMENT ABSCESS Left 02/08/2013   Procedure: IRRIGATION AND DEBRIDEMENT ABSCESS/DRESSING CHANGE;  Surgeon: Gwenyth Ober, MD;  Location: [facility];  Service: General;  Laterality: Left;  . JOINT REPLACEMENT  2010 / 2012   . LAPAROSCOPIC CHOLECYSTECTOMY    . LEFT HEART CATHETERIZATION WITH CORONARY ANGIOGRAM N/A 07/02/2014   Procedure: LEFT HEART CATHETERIZATION WITH CORONARY ANGIOGRAM;  Surgeon: Lorretta Harp, MD;  Location: [facility] C[facility];  Service: Cardiovascular;  Laterality: N/A;  . LOWER EXTREMITY ANGIOGRAM Right 04/20/2016   Procedure: Lower Extremity Angiogram;  Surgeon: Elam Dutch, MD;  Location: [facility] CV [facility];  Service: Cardiovascular;  Laterality: Right;  . MULTIPLE TOOTH EXTRACTIONS    . PERIPHERAL VASCULAR CATHETERIZATION N/A 04/20/2016   Procedure: Abdominal Aortogram;  Surgeon: Elam Dutch, MD;  Location: [facility];  Service: Cardiovascular;  Laterality: N/A;  . PERIPHERAL VASCULAR CATHETERIZATION Right 04/20/2016   Procedure: Peripheral Vascular Intervention;  Surgeon: Elam Dutch, MD;  Location: [facility];  Service: Cardiovascular;  Laterality: Right;  popiteal  . RIGHT HEART CATH N/A 10/27/2018   Procedure: RIGHT HEART CATH;  Surgeon: Larey Dresser, MD;  Location: [facility] CV [facility];  Service: Cardiovascular;  Laterality: N/A;  . RIGHT HEART CATH N/A 03/20/2019   Procedure: RIGHT HEART CATH;  Surgeon: Larey Dresser, MD;  Location: [facility];  Service: Cardiovascular;  Laterality: N/A;  . THYROIDECTOMY    . TOTAL HIP REVISION Right 08/11/2014   Procedure: RIGHT ACETABULAR REVISION;  Surgeon: Gearlean Alf, MD;  [facility];  Service: Orthopedics;  Laterality: Right;  Marland Kitchen VASCULAR SURGERY      SOCIAL HISTORY: Social History    Socioeconomic History  . Marital status: Legally Separated    Spouse name: Not on file  . Number of children: 1  . Years of education: college  . Highest education level: Not on file  Occupational History    Comment: Disabled  Tobacco Use  . Smoking status: Current Some Day Smoker    Packs/day: 0.25    Years: 40.00    Pack years: 10.00    Types: Cigarettes  . Smokeless tobacco: Never Used  Vaping Use  . Vaping Use: Never used  Substance and Sexual Activity  . Alcohol use: Yes    Alcohol/week: 1.0 standard drink    Types: 1 Standard drinks or equivalent per week    Comment: OCC  . Drug use: No  . Sexual activity: Not on file  Other Topics Concern  . Not on file  Social History Narrative   Patient is separated  and lives  at home with her friend. Patient is disabled.   Education one year of college   Right handed   Caffeine two cups daily.      Social Determinants of Health   Financial Resource Strain: Not on file  Food Insecurity: Not on file  Transportation Needs: Not on file  Physical Activity: Not on file  Stress: Not on file  Social Connections: Not on file  Intimate Partner Violence: Not on file    FAMILY HISTORY: Family History  Problem Relation Age of Onset  . Diabetes Brother   . Cardiomyopathy Mother   . Heart disease Mother   . Cancer Father     ALLERGIES:  is allergic to nebivolol, ace inhibitors, and morphine and related.  MEDICATIONS:  Current Outpatient Medications  Medication Sig Dispense Refill  . albuterol (PROAIR HFA) 108 (90 Base) MCG/ACT inhaler Inhale 2 puffs into the lungs 4 (four) times daily as needed for wheezing or shortness of breath.    Marland Kitchen albuterol (PROVENTIL) (2.5 MG/3ML) 0.083% nebulizer solution Take 2.5 mg by nebulization every 6 (six) hours as needed for wheezing or shortness of breath.    . allopurinol (ZYLOPRIM) 100 MG tablet Take 100 mg by mouth every other day.     . B Complex Vitamins (VITAMIN B COMPLEX PO) Take 1  tablet by mouth 2 (two) times daily.     . budesonide-formoterol (SYMBICORT) 160-4.5 MCG/ACT inhaler Inhale 2 puffs into the lungs 2 (two) times daily as needed (for SOB).     . carvedilol (COREG) 12.5 MG tablet Take 12.5 mg by mouth 2 (two) times daily.    . Cholecalciferol (VITAMIN D) 50 MCG (2000 UT) CAPS Take 2,000 Units by mouth daily.    . clopidogrel (PLAVIX) 75 MG tablet TAKE 1 TABLET BY MOUTH EVERY DAY (Patient taking differently: Take 75 mg by mouth daily. ) 30 tablet 0  . diphenhydrAMINE (BENADRYL) 25 MG tablet Take 25 mg by mouth every 6 (six) hours as needed for allergies.    . Dulaglutide 1.5 MG/0.5ML SOPN Inject 1.5 mg into the skin every Monday.     . fluticasone (FLONASE) 50 MCG/ACT nasal spray Place 2 sprays into the nose as needed for allergies.     . hydrOXYzine (ATARAX/VISTARIL) 25 MG tablet Take 25 mg by mouth in the morning and at bedtime.    . insulin lispro protamine-lispro (HUMALOG 75/25 MIX) (75-25) 100 UNIT/ML SUSP injection Inject 35-40 Units into the skin See admin instructions. Inject 40 units into the skin morning and 35 units in the evening    . levothyroxine (SYNTHROID, LEVOTHROID) 150 MCG tablet Take 150 mcg by mouth daily before breakfast.    . magnesium oxide (MAG-OX) 400 (241.3 Mg) MG tablet Take 1 tablet (400 mg total) by mouth 2 (two) times daily. (Patient taking differently: Take 400 mg by mouth daily. ) 30 tablet 0  . metolazone (ZAROXOLYN) 5 MG tablet Take 5 mg by mouth 3 (three) times a week.     . mirabegron ER (MYRBETRIQ) 50 MG TB24 tablet Take 50 mg by mouth daily.    . Multiple Vitamin (MULTIVITAMIN WITH MINERALS) TABS tablet Take 1 tablet by mouth daily.    Marland Kitchen oxyCODONE-acetaminophen (PERCOCET) 10-325 MG tablet Take 1 tablet by mouth every 6 (six) hours as needed for pain. 10 tablet 0  . pantoprazole (PROTONIX) 40 MG tablet Take 40 mg by mouth daily.    . polyvinyl alcohol (LIQUIFILM TEARS) 1.4 % ophthalmic solution Place 2 drops into both  eyes daily  as needed for dry eyes.    Marland Kitchen torsemide (DEMADEX) 100 MG tablet Take 100 mg by mouth 2 (two) times daily.     No current facility-administered medications for this visit.    REVIEW OF SYSTEMS:    10 Point review of Systems was done is negative except as noted above.  PHYSICAL EXAMINATION: ECOG PERFORMANCE STATUS: 1 - Symptomatic but completely ambulatory  .There were no vitals filed for this visit. There were no vitals filed for this visit. .There is no height or weight on file to calculate BMI.  NAD GENERAL:alert, in no acute distress and comfortable SKIN: no acute rashes, no significant lesions EYES: conjunctiva are pink and non-injected, sclera anicteric OROPHARYNX: MMM, no exudates, no oropharyngeal erythema or ulceration NECK: supple, no JVD LYMPH:  no palpable lymphadenopathy in the cervical, axillary or inguinal regions LUNGS: clear to auscultation b/l with normal respiratory effort HEART: regular rate & rhythm ABDOMEN:  normoactive bowel sounds , non tender, not distended. Extremity: no pedal edema PSYCH: alert & oriented x 3 with fluent speech NEURO: no focal motor/sensory deficits  LABORATORY DATA:  I have reviewed the data as listed  . CBC Latest Ref Rng & Units 10/20/2020 10/17/2020 10/17/2020  WBC 4.0 - 10.5 K/uL - 12.2(H) -  Hemoglobin 12.0 - 15.0 g/dL 9.2(L) 7.0(L) -  Hematocrit 36.0 - 46.0 % - 24.8(L) 24.6(L)  Platelets 150 - 400 K/uL - 427(H) -    . CMP Latest Ref Rng & Units 10/17/2020 10/17/2020 10/06/2020  Glucose 70 - 99 mg/dL 197(H) 190(H) 151(H)  BUN 8 - 23 mg/dL 151(H) 138(H) 141(H)  Creatinine 0.44 - 1.00 mg/dL 3.57(H) 3.51(HH) 3.48(H)  Sodium 135 - 145 mmol/L 134(L) 135 135  Potassium 3.5 - 5.1 mmol/L 3.4(L) 3.4(L) 3.2(L)  Chloride 98 - 111 mmol/L 90(L) 92(L) 94(L)  CO2 22 - 32 mmol/L 28 29 26   Calcium 8.9 - 10.3 mg/dL 9.3 9.3 8.9  Total Protein 6.5 - 8.1 g/dL 6.9 7.2 -  Total Bilirubin 0.3 - 1.2 mg/dL 0.5 0.3 -  Alkaline Phos 38 - 126 U/L 72  76 -  AST 15 - 41 U/L 17 14(L) -  ALT 0 - 44 U/L 15 11 -     RADIOGRAPHIC STUDIES: I have personally reviewed the radiological images as listed and agreed with the findings in the report. No results found.  ASSESSMENT & PLAN:   70 yo with   1) Severe anemia - likely related to ESRD.?functional iron def + uremic bone marrow suppression. 2) CKD due to HTN, DM2 etc. 3) . Patient Active Problem List   Diagnosis Date Noted  . Anemia, unspecified 01/08/2020  . Bacteremia 01/07/2020  . UTI (urinary tract infection) 01/06/2020  . Anemia 10/26/2019  . S/P lumbar fusion 09/18/2019  . Chronic CHF (congestive heart failure) ([place]) 03/16/2019  . Leukocytosis 03/16/2019  . Chest pain 11/27/2018  . Pre-syncope 11/27/2018  . Epigastric abdominal pain 11/27/2018  . Solitary pulmonary nodule 11/06/2018  . Hypertensive heart and renal disease, stage 1-4 or unspecified chronic kidney disease, with heart failure ([place]) 10/20/2018  . CHF (congestive heart failure), NYHA class III, acute on chronic, systolic ([place]) 27/25/3664  . 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 Cellulitis of abdominal wall 03/21/2015  . Acute on chronic renal failure ([place]) 03/21/2015  . DM (diabetes mellitus), type 2 with renal complications ([place]) 40/34/7425  . Abdominal wall abscess 03/21/2015  . 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 Low urine output 08/11/2014  . Acute on chronic systolic CHF (congestive heart failure), NYHA class 3 ([place]) 08/11/2014  . Asthma, chronic 08/11/2014  . Hypothyroidism 08/11/2014  . OSA (obstructive sleep apnea) 08/11/2014  . Positive cardiac stress test 06/29/2014  . Hyperlipidemia 02/12/2014  . Left arm weakness 12/23/2013  . TIA (transient ischemic attack) 12/23/2013  . Left buttock abscess 10/12/2013  . Cellulitis and abscess of leg, right 04/16/2013  . Acute blood loss anemia 02/14/2013  . HTN  (hypertension) 02/14/2013  . Cellulitis and abscess of buttock 02/09/2013  . Morbid obesity ([place]) 02/03/2013  . Diarrhea 02/03/2013  . Hyponatremia 02/03/2013  . DM (diabetes mellitus) ([place]) 02/02/2013  . CKD (chronic kidney disease), stage IV ([place]) 02/02/2013  . Cellulitis 02/02/2013  . PAD (peripheral artery disease) ([place]) 04/26/2011  . Tobacco abuse 04/26/2011   PLAN: -labs today .-will need to replace Iron aggressively to maintain ferritin >250 and iron sat >30% -EPO -f/u with nephrology to mx of ESRD and consideration of HD. -B complex po 1 cap daily.  Orders Placed This Encounter  Procedures  . CBC with Differential/Platelet    Standing Status:   Future    Number of Occurrences:   1    Standing Expiration Date:   10/17/2021  . CMP ([place] only)    Standing Status:   Future    Number of Occurrences:   1    Standing Expiration Date:   10/17/2021  . Ferritin    Standing Status:   Future    Number of Occurrences:   1    Standing Expiration Date:   10/17/2021  . Iron and TIBC    Standing Status:   Future    Number of Occurrences:   1    Standing Expiration Date:   10/17/2021  . Vitamin B12    Standing Status:   Future    Number of Occurrences:   1    Standing Expiration Date:   10/17/2021  . Folate RBC    Standing Status:   Future    Number of Occurrences:   1    Standing Expiration Date:   10/17/2021  . Sedimentation rate    Standing Status:   Future    Number of Occurrences:   1    Standing Expiration Date:   10/17/2021  . Sample to Blood Bank    Standing Status:   Future    Number of Occurrences:   1    Standing Expiration Date:   10/17/2021      FOLLOW UP: Labs today Phone visit with Dr Irene Limbo in 1-2 weeks  All of the patients questions were answered with apparent satisfaction. The patient knows to call the clinic with any problems, questions or concerns.  I spent 40 mins counseling the patient face to face. The total time spent in the appointment was  60 mins and more than 50% was on counseling and direct patient cares.    Sullivan Lone MD [place] AAHIVMS [facility] Hematology/Oncology Physician [facility]  (Office):       [phone] (Work cell):  [phone] (Fax):           [phone]  10/17/2020 12:27 AM  I, Yevette Edwards, am acting as a scribe for Dr. Sullivan Lone.   .I have reviewed the above documentation for accuracy and completeness, and I agree with the above. Brunetta Genera MD   ADDENDUM   hgb 6.9 on labs  today. Patient directed to go to ER for PRBC transfusion.  Brunetta Genera MD

## 2020-10-17 NOTE — ED Triage Notes (Signed)
Patient sent by primary MD for low Hgb. Patient denies pain and denies weakness. States that her Hgb was a little over 6 today.

## 2020-10-17 NOTE — Telephone Encounter (Signed)
Scheduled appointments per 1/10 los. Spoke to patient who is aware of appointment date and time. Gave patient calendar print out.

## 2020-10-17 NOTE — Telephone Encounter (Signed)
TCT patient informing her that her Hemoglobin level is critically low at 6.9 and Dr. Irene Limbo strongly recommends that she goes to the emergency room to get blood transfusion. Patient was agreeable and verbalized that she would get her son to drive her to the ED now.

## 2020-10-17 NOTE — ED Provider Notes (Signed)
[facility] EMERGENCY DEPARTMENT Provider Note   CSN: [id number] Arrival date & time: 10/17/20  1523     History No chief complaint on file.   [name] [name] is a 70 y.o. female.  With pertinent past medical history of stage IV CKD, hypertension, diabetes, chronic CHF, anemia that presents to the emergency department today for anemia of chronic disease.  Patient presents today from outpatient medical oncology office after hemoglobin was found to be 7.3, wanted her to come here to the emergency department today for blood transfusion.  Hemoglobin is 7 here today.  Patient's baseline is around 7.5 for the past couple of months.  Patient states that she had to require a blood transfusion in April when she had surgery.  Otherwise she has not had to require an infusion.  Patient states that she is completely asymptomatic.  Denies any weakness, nausea, vomiting, chest pain, shortness of breath, palpitations, fatigue.  Denies any vomiting, hematemesis or hemoptysis.  Denies any melena or hematochezia.  States that she has had normal stools.  Denies bleeding from her vagina or elsewhere.  States that she came here for blood transfusion and wants to be discharged afterwards.  Patient states that she lives with her daughter at home.  No complaints at this time.  HPI     Past Medical History:  Diagnosis Date  . Anemia   . Arthritis   . Asthma   . Bronchitis   . CHF (congestive heart failure) ([place])   . Chronic kidney disease   . Chronic kidney disease   . Chronic pain syndrome   . COPD (chronic obstructive pulmonary disease) ([place])   . Cramping of feet   . Diabetes mellitus   . Dyspnea   . Full dentures   . GERD (gastroesophageal reflux disease)   . Gout   . Headache   . History of hiatal hernia   . History of hip replacement, total   . History of transfusion   . Hx MRSA infection    abscess left groin  . Hx of cardiac cath 06/2014  . Hyperlipidemia   . Hypertension    . Hypothyroid   . 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 Morbidly obese ([place])   . Peripheral vascular disease ([place])   . Positive cardiac stress test 12/2013   anterior and lateral ischemia on Myoview  . Sleep apnea    UNABLE TO TOLERATE C PAP  . Stress incontinence   . Thyroid disease   . TIA (transient ischemic attack)     X2 NO RESIDUAL PROBLEMS  . Wears glasses     Patient Active Problem List   Diagnosis Date Noted  . Anemia, unspecified 01/08/2020  . Bacteremia 01/07/2020  . UTI (urinary tract infection) 01/06/2020  . Anemia 10/26/2019  . S/P lumbar fusion 09/18/2019  . Chronic CHF (congestive heart failure) ([place]) 03/16/2019  . Leukocytosis 03/16/2019  . Chest pain 11/27/2018  . Pre-syncope 11/27/2018  . Epigastric abdominal pain 11/27/2018  . Solitary pulmonary nodule 11/06/2018  . Hypertensive heart and renal disease, stage 1-4 or unspecified chronic kidney disease, with heart failure ([place]) 10/20/2018  . CHF (congestive heart failure), NYHA class III, acute on chronic, systolic ([place]) 21/19/4174  . 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 Cellulitis of abdominal wall 03/21/2015  . Acute on chronic renal failure ([place]) 03/21/2015  . DM (diabetes mellitus), type 2 with renal complications ([place]) 05/21/4817  .  Abdominal wall abscess 03/21/2015  . Failed total hip arthroplasty ([place]) 08/11/2014  . Low urine output 08/11/2014  . Acute on chronic systolic CHF (congestive heart failure), NYHA class 3 ([place]) 08/11/2014  . Asthma, chronic 08/11/2014  . Hypothyroidism 08/11/2014  . OSA (obstructive sleep apnea) 08/11/2014  . Positive cardiac stress test 06/29/2014  . Hyperlipidemia 02/12/2014  . Left arm weakness 12/23/2013  . TIA (transient ischemic attack) 12/23/2013  . Left buttock abscess 10/12/2013  . Cellulitis and abscess of leg, right 04/16/2013  . Acute blood loss anemia 02/14/2013  . HTN (hypertension)  02/14/2013  . Cellulitis and abscess of buttock 02/09/2013  . Morbid obesity ([place]) 02/03/2013  . Diarrhea 02/03/2013  . Hyponatremia 02/03/2013  . DM (diabetes mellitus) ([place]) 02/02/2013  . CKD (chronic kidney disease), stage IV ([place]) 02/02/2013  . Cellulitis 02/02/2013  . PAD (peripheral artery disease) ([place]) 04/26/2011  . Tobacco abuse 04/26/2011    Past Surgical History:  Procedure Laterality Date  . ABDOMINAL HYSTERECTOMY    . BACK SURGERY  2020  . BASCILIC VEIN TRANSPOSITION Left 04/05/2020   Procedure: BASILIC VEIN TRANSPOSITION FIRST STAGE;  Surgeon: Angelia Mould, MD;  Location: [facility];  Service: Vascular;  Laterality: Left;  . COLON SURGERY  1995   DUE TO POLYP  . DILATION AND CURETTAGE OF UTERUS    . FOREARM FRACTURE SURGERY     Left arm  . HERNIA REPAIR     w/ mesh  . INCISION AND DRAINAGE ABSCESS Left 02/04/2013   Procedure: INCISION AND DRAINAGE LEFT BUTTOCK ABSCESS; INCISION AND DRAINAGE LEFT BREAST ABSCESS;  Surgeon: Harl Bowie, MD;  Location: [facility];  Service: General;  Laterality: Left;  . INCISION AND DRAINAGE ABSCESS N/A 02/12/2013   Procedure: INCISION AND DEBRIDEMENT BUTTOCK WOUND ;  Surgeon: [name]. Georgette Dover, MD;  Location: [facility];  Service: General;  Laterality: N/A;  . INCISION AND DRAINAGE ABSCESS N/A 02/14/2013   Procedure: INCISION AND DRAINAGE/DRESSING CHANGE;  Surgeon: Harl Bowie, MD;  Location: [facility];  Service: General;  Laterality: N/A;  . INCISION AND DRAINAGE ABSCESS N/A 03/21/2015   Procedure: INCISION AND DRAINAGE PUBIC ABSCESS;  Surgeon: Excell Seltzer, MD;  Location: [facility];  Service: General;  Laterality: N/A;  . INCISION AND DRAINAGE PERIRECTAL ABSCESS Left 02/10/2013   Procedure: IRRIGATION AND DEBRIDEMENT OF BUTTOCK/PERINEAL ABSCESS;  Surgeon: [name]. Georgette Dover, MD;  Location: [facility];  Service: General;  Laterality: Left;  . INCISION AND DRAINAGE PERIRECTAL ABSCESS N/A 02/16/2013   Procedure: IRRIGATION AND DEBRIDEMENT  PERINEAL ABSCESS;  Surgeon: Zenovia Jarred, MD;  Location: [facility];  Service: General;  Laterality: N/A;  . IRRIGATION AND DEBRIDEMENT ABSCESS N/A 02/06/2013   Procedure: IRRIGATION AND DEBRIDEMENT BUTTOCK ABSCESS AND DRESSING CHANGE;  Surgeon: Harl Bowie, MD;  Location: [facility];  Service: General;  Laterality: N/A;  . IRRIGATION AND DEBRIDEMENT ABSCESS Left 02/08/2013   Procedure: IRRIGATION AND DEBRIDEMENT ABSCESS/DRESSING CHANGE;  Surgeon: Gwenyth Ober, MD;  Location: [facility];  Service: General;  Laterality: Left;  . JOINT REPLACEMENT  2010 / 2012   . LAPAROSCOPIC CHOLECYSTECTOMY    . LEFT HEART CATHETERIZATION WITH CORONARY ANGIOGRAM N/A 07/02/2014   Procedure: LEFT HEART CATHETERIZATION WITH CORONARY ANGIOGRAM;  Surgeon: Lorretta Harp, MD;  Location: [facility] CATH [facility];  Service: Cardiovascular;  Laterality: N/A;  . LOWER EXTREMITY ANGIOGRAM Right 04/20/2016   Procedure: Lower Extremity Angiogram;  Surgeon: Elam Dutch, MD;  Location: [facility] CV LAB;  Service: Cardiovascular;  Laterality: Right;  . MULTIPLE TOOTH EXTRACTIONS    . PERIPHERAL VASCULAR CATHETERIZATION N/A 04/20/2016   Procedure: Abdominal Aortogram;  Surgeon: Elam Dutch, MD;  Location: [facility] CV LAB;  Service: Cardiovascular;  Laterality: N/A;  . PERIPHERAL VASCULAR CATHETERIZATION Right 04/20/2016   Procedure: Peripheral Vascular Intervention;  Surgeon: Elam Dutch, MD;  Location: [facility] CV LAB;  Service: Cardiovascular;  Laterality: Right;  popiteal  . RIGHT HEART CATH N/A 10/27/2018   Procedure: RIGHT HEART CATH;  Surgeon: Larey Dresser, MD;  Location: [facility] CV LAB;  Service: Cardiovascular;  Laterality: N/A;  . RIGHT HEART CATH N/A 03/20/2019   Procedure: RIGHT HEART CATH;  Surgeon: Larey Dresser, MD;  Location: [facility] LAB;  Service: Cardiovascular;  Laterality: N/A;  . THYROIDECTOMY    . TOTAL HIP REVISION Right 08/11/2014   Procedure: RIGHT ACETABULAR REVISION;  Surgeon: Gearlean Alf, MD;  Location: [facility];  Service: Orthopedics;  Laterality: Right;  [facility] VASCULAR SURGERY       OB History   No obstetric history on file.     Family History  Problem Relation Age of Onset  . Diabetes Brother   . Cardiomyopathy Mother   . Heart disease Mother   . Cancer Father     Social History   Tobacco Use  . Smoking status: Current Some Day Smoker    Packs/day: 0.25    Years: 40.00    Pack years: 10.00    Types: Cigarettes  . Smokeless tobacco: Never Used  Vaping Use  . Vaping Use: Never used  Substance Use Topics  . Alcohol use: Yes    Alcohol/week: 1.0 standard drink    Types: 1 Standard drinks or equivalent per week    Comment: OCC  . Drug use: No    Home Medications Prior to Admission medications   Medication Sig Start Date End Date Taking? Authorizing Provider  albuterol (PROAIR HFA) 108 (90 Base) MCG/ACT inhaler Inhale 2 puffs into the lungs 4 (four) times daily as needed for wheezing or shortness of breath.    [provider]  albuterol (PROVENTIL) (2.5 MG/3ML) 0.083% nebulizer solution Take 2.5 mg by nebulization every 6 (six) hours as needed for wheezing or shortness of breath.    [provider]  allopurinol (ZYLOPRIM) 100 MG tablet Take 100 mg by mouth every other day.     [provider]  B Complex Vitamins (VITAMIN B COMPLEX PO) Take 1 tablet by mouth 2 (two) times daily.     [provider]  budesonide-formoterol (SYMBICORT) 160-4.5 MCG/ACT inhaler Inhale 2 puffs into the lungs 2 (two) times daily as needed (for SOB).     [provider]  carvedilol (COREG) 12.5 MG tablet Take 12.5 mg by mouth 2 (two) times daily. 11/25/19   [provider]  Cholecalciferol (VITAMIN D) 50 MCG (2000 UT) CAPS Take 2,000 Units by mouth daily.    [provider]  cinacalcet (SENSIPAR) 30 MG tablet Take 30 mg by mouth daily. 07/19/20   [provider]  clopidogrel (PLAVIX) 75 MG tablet TAKE 1  TABLET BY MOUTH EVERY DAY Patient taking differently: Take 75 mg by mouth daily.  06/18/17   Waynetta Sandy, MD  clotrimazole-betamethasone (LOTRISONE) cream Apply topically 2 (two) times daily as needed. 09/14/20   [provider]  diclofenac Sodium (VOLTAREN) 1 % GEL Apply 1 application topically 4 (four) times daily as needed. 08/15/20   [provider]  diphenhydrAMINE (BENADRYL) 25 MG tablet Take 25 mg by mouth every 6 (six) hours as needed for allergies.    [provider]  Dulaglutide 1.5 MG/0.5ML SOPN Inject 1.5 mg into the skin every Monday.     [provider]  fluticasone (FLONASE) 50 MCG/ACT nasal spray Place 2 sprays into the nose as needed for allergies.     [provider]  hydrOXYzine (ATARAX/VISTARIL) 25 MG tablet Take 25 mg by mouth in the morning and at bedtime.    [provider]  insulin lispro protamine-lispro (HUMALOG 75/25 MIX) (75-25) 100 UNIT/ML SUSP injection Inject 35-40 Units into the skin See admin instructions. Inject 40 units into the skin morning and 35 units in the evening    [provider]  levothyroxine (SYNTHROID, LEVOTHROID) 150 MCG tablet Take 150 mcg by mouth daily before breakfast.    [provider]  lubiprostone (AMITIZA) 24 MCG capsule Take 24 mcg by mouth 2 (two) times daily. 10/04/20   [provider]  magnesium oxide (MAG-OX) 400 (241.3 Mg) MG tablet Take 1 tablet (400 mg total) by mouth 2 (two) times daily. Patient taking differently: Take 400 mg by mouth daily.  01/14/20   Lavina Hamman, MD  metolazone (ZAROXOLYN) 5 MG tablet Take 5 mg by mouth 3 (three) times a week.     [provider]  mirabegron ER (MYRBETRIQ) 50 MG TB24 tablet Take 50 mg by mouth daily.    [provider]  Multiple Vitamin (MULTIVITAMIN WITH MINERALS) TABS tablet Take 1 tablet by mouth daily.    [provider]  oxyCODONE-acetaminophen (PERCOCET) 10-325 MG tablet  Take 1 tablet by mouth every 6 (six) hours as needed for pain. 04/05/20   Dagoberto Ligas, PA-C  pantoprazole (PROTONIX) 40 MG tablet Take 40 mg by mouth daily.    [provider]  polyvinyl alcohol (LIQUIFILM TEARS) 1.4 % ophthalmic solution Place 2 drops into both eyes daily as needed for dry eyes.    [provider]  Potassium Chloride ER 20 MEQ TBCR Take 20 mEq by mouth See admin instructions. Take one tablet (20 meq) by mouth three times weekly - Tuesday, Thursday, Saturday 08/24/20   [provider]  torsemide (DEMADEX) 100 MG tablet Take 100 mg by mouth 2 (two) times daily.    [provider]    Allergies    Nebivolol, Ace inhibitors, and Morphine and related  Review of Systems   Review of Systems  Constitutional: Negative for chills, diaphoresis, fatigue and fever.  HENT: Negative for congestion, mouth sores, sore throat and trouble swallowing.   Eyes: Negative for pain and visual disturbance.  Respiratory: Negative for cough, shortness of breath and wheezing.   Cardiovascular: Negative for chest pain, palpitations and leg swelling.  Gastrointestinal: Negative for abdominal distention, abdominal pain, diarrhea, nausea and vomiting.  Genitourinary: Negative for difficulty urinating and vaginal bleeding.  Musculoskeletal: Negative for back pain, neck pain and neck stiffness.  Skin: Negative for pallor.  Neurological: Negative for dizziness, speech difficulty, weakness and headaches.  Psychiatric/Behavioral: Negative for confusion.    Physical Exam Updated Vital Signs BP (!) 146/56 (BP Location: Right Arm)   Pulse 83   Temp 98.4 F (36.9 C)   Resp 14   SpO2 96%   Physical Exam Constitutional:      General: She is not in acute distress.    Appearance: Normal appearance. She is not ill-appearing, toxic-appearing or diaphoretic.  HENT:     Mouth/Throat:  Mouth: Mucous membranes are moist.     Pharynx: Oropharynx is clear.  Eyes:      General: No scleral icterus.    Extraocular Movements: Extraocular movements intact.     Pupils: Pupils are equal, round, and reactive to light.  Cardiovascular:     Rate and Rhythm: Normal rate and regular rhythm.     Pulses: Normal pulses.     Heart sounds: Normal heart sounds.  Pulmonary:     Effort: Pulmonary effort is normal. No respiratory distress.     Breath sounds: Normal breath sounds. No stridor. No wheezing, rhonchi or rales.  Chest:     Chest wall: No tenderness.  Abdominal:     General: Abdomen is flat. There is no distension.     Palpations: Abdomen is soft.     Tenderness: There is no abdominal tenderness. There is no guarding or rebound.  Genitourinary:    Comments: Chaperone present. Digital Rectal exam reveals sphincter with good tone. No external hemorrhoids, masses, or fissures. Stool color is brown with no overt blood. No gross melena.   Musculoskeletal:        General: No swelling or tenderness. Normal range of motion.     Cervical back: Normal range of motion and neck supple. No rigidity.     Right lower leg: No edema.     Left lower leg: No edema.  Skin:    General: Skin is warm and dry.     Capillary Refill: Capillary refill takes less than 2 seconds.     Coloration: Skin is not pale.  Neurological:     General: No focal deficit present.     Mental Status: She is alert and oriented to person, place, and time.  Psychiatric:        Mood and Affect: Mood normal.        Behavior: Behavior normal.     ED Results / Procedures / Treatments   Labs (all labs ordered are listed, but only abnormal results are displayed) Labs Reviewed  COMPREHENSIVE METABOLIC PANEL - Abnormal; Notable for the following components:      Result Value   Sodium 134 (*)    Potassium 3.4 (*)    Chloride 90 (*)    Glucose, Bld 197 (*)    BUN 151 (*)    Creatinine, Ser 3.57 (*)    Albumin 3.0 (*)    GFR, Estimated 13 (*)    Anion gap 16 (*)    All other components within  normal limits  CBC - Abnormal; Notable for the following components:   WBC 12.2 (*)    RBC 3.29 (*)    Hemoglobin 7.0 (*)    HCT 24.8 (*)    MCV 75.4 (*)    MCH 21.3 (*)    MCHC 28.2 (*)    RDW 19.9 (*)    Platelets 427 (*)    All other components within normal limits  POC OCCULT BLOOD, ED  TYPE AND SCREEN  PREPARE RBC (CROSSMATCH)    EKG None  Radiology No results found.  Procedures .Critical Care Performed by: Alfredia Client, PA-C Authorized by: Alfredia Client, PA-C   Critical care provider statement:    Critical care time (minutes):  45   Critical care was time spent personally by me on the following activities:  Discussions with consultants, evaluation of patient's response to treatment, examination of patient, ordering and performing treatments and interventions, ordering and review of laboratory studies, ordering and review of radiographic  studies, pulse oximetry, re-evaluation of patient's condition, obtaining history from patient or surrogate and review of old charts   (including critical care time)  Medications Ordered in ED Medications  0.9 %  sodium chloride infusion (Manually program via Guardrails IV Fluids) (has no administration in time range)    ED Course  I have reviewed the triage vital signs and the nursing notes.  Pertinent labs & imaging results that were available during my care of the patient were reviewed by me and considered in my medical decision making (see chart for details).  Clinical Course as of 10/17/20 2248  Mon Oct 17, 2674  602 70 year old female with chronic anemia here after she had routine labs done by her PCP and received a call that her hemoglobin was low and she needed to come to the emergency department.  She has some chronic fatigue but no acute symptoms.  Repeat hemoglobin here is slightly better at 7.0.  We will transfuse 1 unit.  Otherwise do not feel like she needs to be admitted to the hospital.  Heme-negative. [MB]     Clinical Course User Index [MB] Hayden Rasmussen, MD   MDM Rules/Calculators/A&P                          [name] [name] is a 70 y.o. female.  With pertinent past medical history of stage IV CKD, hypertension, diabetes, chronic CHF, anemia that presents to the emergency department today for anemia of chronic disease.  Patient's hemoglobin today is 7, patient is asymptomatic.  We will transfuse 1 unit since patient does have cardiac history with CHF, patient also does have CKD.  Not on dialysis.  We will transfuse 1 unit at this time and then observe.  If patient is still asymptomatic patient to be discharged.  Fecal occult negative.  CMP with electrolyte derangements including creatinine of 3.57,  this appear to be patient's baseline at this time.  Pt care was handed off to S. Upstill  PA-C at handoff  Complete history and physical and current plan have been communicated.  Please refer to their note for the remainder of ED care and ultimate disposition.  Patient will need to be observed an hour after transfusion and then can be discharged if still asymptomatic.  I discussed this case with my attending physician who cosigned this note including patient's presenting symptoms, physical exam, and planned diagnostics and interventions. Attending physician stated agreement with plan or made changes to plan which were implemented.   Attending physician assessed patient at bedside.    Final Clinical Impression(s) / ED Diagnoses Final diagnoses:  Anemia due to stage 4 chronic kidney disease [facility])    Rx / DC Orders ED Discharge Orders    None       Alfredia Client, PA-C 10/17/20 2343    Hayden Rasmussen, MD 10/18/20 1021

## 2020-10-17 NOTE — Progress Notes (Signed)
CRITICAL VALUE STICKER  CRITICAL VALUE: HGB 6.9  RECEIVER (on-site recipient of call): Jobe Igo, [place] NOTIFIED: 10/17/20 @ 1312  MESSENGER (representative from lab): Rosann Auerbach  MD NOTIFIED: Fabienne Bruns  TIME OF NOTIFICATION: 0600  RESPONSE: MD will address.

## 2020-10-18 DIAGNOSIS — D649 Anemia, unspecified: Secondary | ICD-10-CM | POA: Diagnosis not present

## 2020-10-18 LAB — FOLATE RBC
Folate, Hemolysate: >620 ng/mL
Folate, RBC: >2520 ng/mL (ref >498)
Hematocrit: 24.6 % — ABNORMAL LOW (ref 34.0–46.6)

## 2020-10-18 NOTE — Discharge Instructions (Addendum)
You have received the recommended blood transfusion in the emergency department. You can be discharged home and can follow up with your oncologist as advised by their office.   Please return to the emergency department with any urgent medical need at any time.

## 2020-10-18 NOTE — ED Provider Notes (Signed)
Here for nonemergent blood transfusion 11:50 - blood ready - transfusion to start in the next few minutes.  1:25 - transfusion running. No transfusion reactions. Anticipate discharge home when complete.   4:15 - transfusion complete. VSS. No adverse reactions. She can be discharged home with oncology for further outpatient management as planned.    Charlann Lange, PA-C 04/59/97 7414    Delora Fuel, MD 23/95/32 Delrae Rend

## 2020-10-19 LAB — BPAM RBC
Blood Product Expiration Date: 202202112359
ISSUE DATE / TIME: 202201110007
Unit Type and Rh: 5100

## 2020-10-19 LAB — TYPE AND SCREEN
ABO/RH(D): O POS
Antibody Screen: NEGATIVE
Unit division: 0

## 2020-10-20 ENCOUNTER — Encounter (HOSPITAL_COMMUNITY): Payer: Medicare Other

## 2020-10-20 ENCOUNTER — Other Ambulatory Visit: Payer: Self-pay

## 2020-10-20 ENCOUNTER — Other Ambulatory Visit (HOSPITAL_COMMUNITY): Payer: Self-pay | Admitting: *Deleted

## 2020-10-20 ENCOUNTER — Encounter (HOSPITAL_COMMUNITY)
Admission: RE | Admit: 2020-10-20 | Discharge: 2020-10-20 | Disposition: A | Discharge status: Home / Self Care | Payer: Medicare Other | Source: Ambulatory Visit | Attending: Internal Medicine | Admitting: Internal Medicine

## 2020-10-20 VITALS — BP 133/62 | HR 83 | Temp 97.7°F | Resp 20

## 2020-10-20 DIAGNOSIS — N184 Chronic kidney disease, stage 4 (severe): Secondary | ICD-10-CM | POA: Diagnosis present

## 2020-10-20 DIAGNOSIS — N179 Acute kidney failure, unspecified: Secondary | ICD-10-CM | POA: Insufficient documentation

## 2020-10-20 LAB — IRON AND TIBC
Iron: 18 ug/dL — ABNORMAL LOW (ref 28–170)
Saturation Ratios: 4 % — ABNORMAL LOW (ref 10.4–31.8)
TIBC: 420 ug/dL (ref 250–450)
UIBC: 402 ug/dL

## 2020-10-20 LAB — POCT HEMOGLOBIN-HEMACUE: Hemoglobin: 9.2 g/dL — ABNORMAL LOW (ref 12.0–15.0)

## 2020-10-20 LAB — FERRITIN: Ferritin: 12 ng/mL (ref 11–307)

## 2020-10-20 MED ORDER — EPOETIN ALFA-EPBX 40000 UNIT/ML IJ SOLN
INTRAMUSCULAR | Status: AC
  Administered 2020-10-20: 60000 [IU]
  Filled 2020-10-20: qty 2

## 2020-10-20 MED ORDER — CLONIDINE HCL 0.1 MG PO TABS: 0.1000 mg | ORAL_TABLET | Freq: Once | ORAL | Status: DC | PRN

## 2020-10-20 MED ORDER — EPOETIN ALFA-EPBX 40000 UNIT/ML IJ SOLN: 60000.0000 [IU] | INTRAMUSCULAR | Status: DC

## 2020-10-31 NOTE — Progress Notes (Signed)
HEMATOLOGY/ONCOLOGY CONSULTATION NOTE  Date of Service: 10/31/2020  Patient Care Team: [name], MD as PCP - General (Internal Medicine) Larey Dresser, MD as PCP - Advanced Heart Failure (Cardiology) Lorretta Harp, MD as Consulting Physician (Cardiology) Estanislado Emms, MD (Inactive) as Consulting Physician (Nephrology)/[name]  CHIEF COMPLAINTS/PURPOSE OF CONSULTATION:  Anemia  HISTORY OF PRESENTING ILLNESS:   [name] [name] is a wonderful 70 y.o. female who has been referred to [name] by Conni Elliot RMA   for evaluation and management of anemia.   Patient has had worsening anemia in the setting of ESRD awaiting initiation of hemodialysis. She is followed by [name] for nephrology.She was referred to [name] for evaluation and mx of anemia.  Most recent lab results CBC is as follows: all values are 10/06/2020 Hgb at 7.3  Patient notes increased fatigue and DOE but no overt chest pain or SOB.  Patient notes she might have previously received IV Iron but is not sure. NO weight loss. Does note that she might have received IV iron previously but not sure about EPO.  Interval History  I connected with [name]  on 11/01/2020 by telephone and verified that I am speaking with the correct person using two identifiers.   I discussed the limitations of evaluation and management by telemedicine. The patient expressed understanding and agreed to proceed.   Other persons participating in the visit and their role in the encounter:                                                             - Reinaldo Raddle, Medical Scribe                - Nathanial Rancher, MD     Patient's location: Home Provider's location: [place] [facility] [place] is a wonderful 70 y.o. female who presents today for evaluation and management of anemia. The patient's last visit with [name] was on 10/17/2020. The pt reports that she is doing well overall.  The pt reports no new symptoms or  concerns.  Lab results 10/17/2020 of CBC w/diff and CMP is as follows: all values are WNL except for Potassium of 3.4, Chloride of 92, Glucose of 190, BUN at 138, Creatinine at 3.51, Albumin at 3.1, AST of 14, GFR est 13, WBC of 11.5, RBC at 3.17, Hgb of 6.9, HCT of 23.5, MCV of 74.1, MCH of 21.8, MCHC of 29.4, RDW of 19.7, Plt of 405K, Neutro Abs at 9.1K, Abs Immat Granulocytes at 0.13K.  10/20/2020 Ferritin at 12. 10/20/2020 Hgb at 9.2.  10/20/2020 Iron At 18, Sat Rat of 4.   On review of systems, pt  denies lightheadedness, dizziness, bloody/black stools and any other symptoms.    MEDICAL HISTORY:  Past Medical History:  Diagnosis Date  . Anemia   . Arthritis   . Asthma   . Bronchitis   . CHF (congestive heart failure) ([place])   . Chronic kidney disease   . Chronic kidney disease   . Chronic pain syndrome   . COPD (chronic obstructive pulmonary disease) ([place])   . Cramping of feet   . Diabetes mellitus   . Dyspnea   . Full dentures   . GERD (gastroesophageal reflux disease)   .  Gout   . Headache   . History of hiatal hernia   . History of hip replacement, total   . History of transfusion   . Hx MRSA infection    abscess left groin  . Hx of cardiac cath 06/2014  . Hyperlipidemia   . Hypertension   . Hypothyroid   . 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 Morbidly obese ([place])   . Peripheral vascular disease ([place])   . Positive cardiac stress test 12/2013   anterior and lateral ischemia on Myoview  . Sleep apnea    UNABLE TO TOLERATE C PAP  . Stress incontinence   . Thyroid disease   . TIA (transient ischemic attack)     X2 NO RESIDUAL PROBLEMS  . Wears glasses     SURGICAL HISTORY: Past Surgical History:  Procedure Laterality Date  . ABDOMINAL HYSTERECTOMY    . BACK SURGERY  2020  . BASCILIC VEIN TRANSPOSITION Left 04/05/2020   Procedure: BASILIC VEIN TRANSPOSITION FIRST STAGE;  Surgeon: Angelia Mould, MD;  Location: [facility];  Service: Vascular;  Laterality:  Left;  . COLON SURGERY  1995   DUE TO POLYP  . DILATION AND CURETTAGE OF UTERUS    . FOREARM FRACTURE SURGERY     Left arm  . HERNIA REPAIR     w/ mesh  . INCISION AND DRAINAGE ABSCESS Left 02/04/2013   Procedure: INCISION AND DRAINAGE LEFT BUTTOCK ABSCESS; INCISION AND DRAINAGE LEFT BREAST ABSCESS;  Surgeon: Harl Bowie, MD;  Location: [facility];  Service: General;  Laterality: Left;  . INCISION AND DRAINAGE ABSCESS N/A 02/12/2013   Procedure: INCISION AND DEBRIDEMENT BUTTOCK WOUND ;  Surgeon: [name]. Georgette Dover, MD;  Location: [facility];  Service: General;  Laterality: N/A;  . INCISION AND DRAINAGE ABSCESS N/A 02/14/2013   Procedure: INCISION AND DRAINAGE/DRESSING CHANGE;  Surgeon: Harl Bowie, MD;  Location: [facility];  Service: General;  Laterality: N/A;  . INCISION AND DRAINAGE ABSCESS N/A 03/21/2015   Procedure: INCISION AND DRAINAGE PUBIC ABSCESS;  Surgeon: Excell Seltzer, MD;  Location: [facility];  Service: General;  Laterality: N/A;  . INCISION AND DRAINAGE PERIRECTAL ABSCESS Left 02/10/2013   Procedure: IRRIGATION AND DEBRIDEMENT OF BUTTOCK/PERINEAL ABSCESS;  Surgeon: [name]. Georgette Dover, MD;  Location: [facility];  Service: General;  Laterality: Left;  . INCISION AND DRAINAGE PERIRECTAL ABSCESS N/A 02/16/2013   Procedure: IRRIGATION AND DEBRIDEMENT PERINEAL ABSCESS;  Surgeon: Zenovia Jarred, MD;  Location: [facility];  Service: General;  Laterality: N/A;  . IRRIGATION AND DEBRIDEMENT ABSCESS N/A 02/06/2013   Procedure: IRRIGATION AND DEBRIDEMENT BUTTOCK ABSCESS AND DRESSING CHANGE;  Surgeon: Harl Bowie, MD;  Location: [facility];  Service: General;  Laterality: N/A;  . IRRIGATION AND DEBRIDEMENT ABSCESS Left 02/08/2013   Procedure: IRRIGATION AND DEBRIDEMENT ABSCESS/DRESSING CHANGE;  Surgeon: Gwenyth Ober, MD;  Location: [facility];  Service: General;  Laterality: Left;  . JOINT REPLACEMENT  2010 / 2012   . LAPAROSCOPIC CHOLECYSTECTOMY    . LEFT HEART CATHETERIZATION WITH CORONARY ANGIOGRAM N/A  07/02/2014   Procedure: LEFT HEART CATHETERIZATION WITH CORONARY ANGIOGRAM;  Surgeon: Lorretta Harp, MD;  Location: [facility] CATH LAB;  Service: Cardiovascular;  Laterality: N/A;  . LOWER EXTREMITY ANGIOGRAM Right 04/20/2016   Procedure: Lower Extremity Angiogram;  Surgeon: Elam Dutch, MD;  Location: [facility] CV LAB;  Service: Cardiovascular;  Laterality: Right;  . MULTIPLE TOOTH EXTRACTIONS    . PERIPHERAL VASCULAR CATHETERIZATION N/A 04/20/2016   Procedure: Abdominal  Aortogram;  Surgeon: Elam Dutch, MD;  Location: [facility];  Service: Cardiovascular;  Laterality: N/A;  . PERIPHERAL VASCULAR CATHETERIZATION Right 04/20/2016   Procedure: Peripheral Vascular Intervention;  Surgeon: Elam Dutch, MD;  Location: [facility] CV LAB;  Service: Cardiovascular;  Laterality: Right;  popiteal  . RIGHT HEART CATH N/A 10/27/2018   Procedure: RIGHT HEART CATH;  Surgeon: Larey Dresser, MD;  Location: [place] [facility];  Service: Cardiovascular;  Laterality: N/A;  . RIGHT HEART CATH N/A 03/20/2019   Procedure: RIGHT HEART CATH;  Surgeon: Larey Dresser, MD;  Location: [facility] CV LAB;  Service: Cardiovascular;  Laterality: N/A;  . THYROIDECTOMY    . TOTAL HIP REVISION Right 08/11/2014   Procedure: RIGHT ACETABULAR REVISION;  Surgeon: Gearlean Alf, MD;  Location[facility];  Service: Orthopedics;  Laterality: Right;  Marland Kitchen VASCULAR SURGERY      SOCIAL HISTORY: Social History   Socioeconomic History  . Marital status: Legally Separated    Spouse name: Not on file  . Number of children: 1  . Years of education: college  . Highest education level: Not on file  Occupational History    Comment: Disabled  Tobacco Use  . Smoking status: Current Some Day Smoker    Packs/day: 0.25    Years: 40.00    Pack years: 10.00    Types: Cigarettes  . Smokeless tobacco: Never Used  Vaping Use  . Vaping Use: Never used  Substance and Sexual Activity  . Alcohol use: Yes    Alcohol/week:  1.0 standard drink    Types: 1 Standard drinks or equivalent per week    Comment: OCC  . Drug use: No  . Sexual activity: Not on file  Other Topics Concern  . Not on file  Social History Narrative   Patient is separated  and lives at home with her friend. Patient is disabled.   Education one year of college   Right handed   Caffeine two cups daily.      Social Determinants of Health   Financial Resource Strain: Not on file  Food Insecurity: Not on file  Transportation Needs: Not on file  Physical Activity: Not on file  Stress: Not on file  Social Connections: Not on file  Intimate Partner Violence: Not on file    FAMILY HISTORY: Family History  Problem Relation Age of Onset  . Diabetes Brother   . Cardiomyopathy Mother   . Heart disease Mother   . Cancer Father     ALLERGIES:  is allergic to nebivolol, ace inhibitors, and morphine and related.  MEDICATIONS:  Current Outpatient Medications  Medication Sig Dispense Refill  . albuterol (PROVENTIL) (2.5 MG/3ML) 0.083% nebulizer solution Take 2.5 mg by nebulization every 6 (six) hours as needed for wheezing or shortness of breath.    Marland Kitchen albuterol (VENTOLIN HFA) 108 (90 Base) MCG/ACT inhaler Inhale 2 puffs into the lungs 4 (four) times daily as needed for wheezing or shortness of breath.    . allopurinol (ZYLOPRIM) 100 MG tablet Take 100 mg by mouth daily.    . B Complex Vitamins (VITAMIN B COMPLEX PO) Take 1 tablet by mouth daily.    . budesonide-formoterol (SYMBICORT) 160-4.5 MCG/ACT inhaler Inhale 2 puffs into the lungs 2 (two) times daily as needed (for SOB).     . carvedilol (COREG) 12.5 MG tablet Take 12.5 mg by mouth 2 (two) times daily.    . Cholecalciferol (VITAMIN D) 50 MCG (2000 UT)  CAPS Take 2,000 Units by mouth daily.    . cinacalcet (SENSIPAR) 30 MG tablet Take 30 mg by mouth daily.    . clopidogrel (PLAVIX) 75 MG tablet TAKE 1 TABLET BY MOUTH EVERY DAY (Patient taking differently: Take 75 mg by mouth daily.) 30  tablet 0  . clotrimazole-betamethasone (LOTRISONE) cream Apply 1 application topically 2 (two) times daily as needed (rash/itching).    Marland Kitchen diclofenac Sodium (VOLTAREN) 1 % GEL Apply 1 application topically 4 (four) times daily as needed (pain).    Marland Kitchen diphenhydrAMINE (BENADRYL) 25 MG tablet Take 25 mg by mouth every 6 (six) hours as needed for allergies.    . Dulaglutide (TRULICITY) 1.5 ZJ/6.7HA SOPN Inject 1.5 mg into the skin every Monday.    . fluticasone (FLONASE) 50 MCG/ACT nasal spray Place 2 sprays into the nose as needed for allergies.     . hydrOXYzine (ATARAX/VISTARIL) 25 MG tablet Take 25 mg by mouth 3 (three) times daily as needed for itching.    . insulin lispro protamine-lispro (HUMALOG 75/25 MIX) (75-25) 100 UNIT/ML SUSP injection Inject 30-40 Units into the skin See admin instructions. Inject 40 units into the skin morning and 30 units in the evening    . levothyroxine (SYNTHROID, LEVOTHROID) 150 MCG tablet Take 150 mcg by mouth daily before breakfast.    . lubiprostone (AMITIZA) 24 MCG capsule Take 24 mcg by mouth 2 (two) times daily.    . magnesium oxide (MAG-OX) 400 (241.3 Mg) MG tablet Take 1 tablet (400 mg total) by mouth 2 (two) times daily. (Patient taking differently: Take 400 mg by mouth daily.) 30 tablet 0  . metolazone (ZAROXOLYN) 5 MG tablet Take 5 mg by mouth every Monday, Wednesday, and Friday.    . mirabegron ER (MYRBETRIQ) 50 MG TB24 tablet Take 50 mg by mouth at bedtime.    . Multiple Vitamin (MULTIVITAMIN WITH MINERALS) TABS tablet Take 1 tablet by mouth daily.    Marland Kitchen oxyCODONE-acetaminophen (PERCOCET) 10-325 MG tablet Take 1 tablet by mouth every 6 (six) hours as needed for pain. (Patient taking differently: Take 1 tablet by mouth every 6 (six) hours.) 10 tablet 0  . pantoprazole (PROTONIX) 40 MG tablet Take 40 mg by mouth daily.    . polyvinyl alcohol (LIQUIFILM TEARS) 1.4 % ophthalmic solution Place 2 drops into both eyes daily as needed for dry eyes.    . Potassium  Chloride ER 20 MEQ TBCR Take 20 mEq by mouth See admin instructions. Take one tablet (20 meq) by mouth three times weekly - Tuesday, Thursday, Saturday    . tapentadol (NUCYNTA) 50 MG tablet Take 50 mg by mouth at bedtime.    . torsemide (DEMADEX) 100 MG tablet Take 100 mg by mouth 2 (two) times daily.     No current facility-administered medications for this visit.    REVIEW OF SYSTEMS:    10 Point review of Systems was done is negative except as noted above.   PHYSICAL EXAMINATION: ECOG PERFORMANCE STATUS: 1 - Symptomatic but completely ambulatory  .There were no vitals filed for this visit. There were no vitals filed for this visit. .There is no height or weight on file to calculate BMI.  Telehealth Visit    LABORATORY DATA:  I have reviewed the data as listed  . CBC Latest Ref Rng & Units 10/20/2020 10/17/2020 10/17/2020  WBC 4.0 - 10.5 K/uL - 12.2(H) -  Hemoglobin 12.0 - 15.0 g/dL 9.2(L) 7.0(L) -  Hematocrit 36.0 - 46.0 % - 24.8(L) 24.6(L)  Platelets 150 - 400 K/uL - 427(H) -    . CMP Latest Ref Rng & Units 10/17/2020 10/17/2020 10/06/2020  Glucose 70 - 99 mg/dL 197(H) 190(H) 151(H)  BUN 8 - 23 mg/dL 151(H) 138(H) 141(H)  Creatinine 0.44 - 1.00 mg/dL 3.57(H) 3.51(HH) 3.48(H)  Sodium 135 - 145 mmol/L 134(L) 135 135  Potassium 3.5 - 5.1 mmol/L 3.4(L) 3.4(L) 3.2(L)  Chloride 98 - 111 mmol/L 90(L) 92(L) 94(L)  CO2 22 - 32 mmol/L 28 29 26   Calcium 8.9 - 10.3 mg/dL 9.3 9.3 8.9  Total Protein 6.5 - 8.1 g/dL 6.9 7.2 -  Total Bilirubin 0.3 - 1.2 mg/dL 0.5 0.3 -  Alkaline Phos 38 - 126 U/L 72 76 -  AST 15 - 41 U/L 17 14(L) -  ALT 0 - 44 U/L 15 11 -     RADIOGRAPHIC STUDIES: I have personally reviewed the radiological images as listed and agreed with the findings in the report. No results found.  ASSESSMENT & PLAN:   70 yo with   1) Severe anemia - likely related to ESRD.?functional iron def + uremic bone marrow suppression. 2) CKD due to HTN, DM2 etc. 3) . Patient  Active Problem List   Diagnosis Date Noted  . Anemia, unspecified 01/08/2020  . Bacteremia 01/07/2020  . UTI (urinary tract infection) 01/06/2020  . Anemia 10/26/2019  . S/P lumbar fusion 09/18/2019  . Chronic CHF (congestive heart failure) ([place]) 03/16/2019  . Leukocytosis 03/16/2019  . Chest pain 11/27/2018  . Pre-syncope 11/27/2018  . Epigastric abdominal pain 11/27/2018  . Solitary pulmonary nodule 11/06/2018  . Hypertensive heart and renal disease, stage 1-4 or unspecified chronic kidney disease, with heart failure ([place]) 10/20/2018  . CHF (congestive heart failure), NYHA class III, acute on chronic, systolic ([place]) 18/29/9371  . 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 Cellulitis of abdominal wall 03/21/2015  . Acute on chronic renal failure ([place]) 03/21/2015  . DM (diabetes mellitus), type 2 with renal complications ([place]) 69/67/8938  . Abdominal wall abscess 03/21/2015  . 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 Low urine output 08/11/2014  . Acute on chronic systolic CHF (congestive heart failure), NYHA class 3 ([place]) 08/11/2014  . Asthma, chronic 08/11/2014  . Hypothyroidism 08/11/2014  . OSA (obstructive sleep apnea) 08/11/2014  . Positive cardiac stress test 06/29/2014  . Hyperlipidemia 02/12/2014  . Left arm weakness 12/23/2013  . TIA (transient ischemic attack) 12/23/2013  . Left buttock abscess 10/12/2013  . Cellulitis and abscess of leg, right 04/16/2013  . Acute blood loss anemia 02/14/2013  . HTN (hypertension) 02/14/2013  . Cellulitis and abscess of buttock 02/09/2013  . Morbid obesity ([place]) 02/03/2013  . Diarrhea 02/03/2013  . Hyponatremia 02/03/2013  . DM (diabetes mellitus) ([place]) 02/02/2013  . CKD (chronic kidney disease), stage IV ([place]) 02/02/2013  . Cellulitis 02/02/2013  . PAD (peripheral artery disease) ([place]) 04/26/2011  . Tobacco abuse 04/26/2011   PLAN: -Discussed labs from  10/20/2020; must get IV Iron Injectafer ASAP; Hgb back to 9.2. -Discussed labs from 10/17/2020; very close to needing dialysis; was sent to ED for transfusion due to Hgb of 6.9 and received 1 unit of blood -Advised pt that iron deficiency is major contributing factor to anemia. The pt notes she is scheduled for Iron infusion this Thursday. -Advised pt that renal disease is factor for decreased erythropoietin levels. -Advised pt that we will let Dr. [name] continue to monitor her Iron levels and anemia  at this time. She will need to continue f/u with [name] for IV iron replacement aggressively  to maintain ferritin >250 and iron sat >30%. -Defer ESA management to nephrology at this time at this time.  -Will see back in 2 months with labs.  No orders of the defined types were placed in this encounter.   FOLLOW UP: RTC w Dr Irene Limbo in 2 months with labs  All of the patients questions were answered with apparent satisfaction. The patient knows to call the clinic with any problems, questions or concerns.   The total time spent in the appointment was 20 minutes and more than 50% was on counseling and direct patient cares.    Sullivan Lone MD [facility] AAHIVMS [facility] Hematology/Oncology Physician [facility]  (Office):       [phone] (Work cell):  [phone] (Fax):           [phone]  10/31/2020 5:39 PM  I, Reinaldo Raddle, am acting as scribe for Dr. Sullivan Lone, MD.    .I have reviewed the above documentation for accuracy and completeness, and I agree with the above. Brunetta Genera MD

## 2020-11-01 ENCOUNTER — Inpatient Hospital Stay (HOSPITAL_BASED_OUTPATIENT_CLINIC_OR_DEPARTMENT_OTHER): Payer: Medicare Other | Admitting: Hematology

## 2020-11-01 DIAGNOSIS — D649 Anemia, unspecified: Secondary | ICD-10-CM

## 2020-11-03 ENCOUNTER — Other Ambulatory Visit: Payer: Self-pay

## 2020-11-03 ENCOUNTER — Encounter (HOSPITAL_COMMUNITY)
Admission: RE | Admit: 2020-11-03 | Discharge: 2020-11-03 | Disposition: A | Discharge status: Home / Self Care | Payer: Medicare Other | Source: Ambulatory Visit | Attending: Internal Medicine | Admitting: Internal Medicine

## 2020-11-03 VITALS — BP 146/77 | HR 87 | Temp 97.9°F | Resp 20

## 2020-11-03 DIAGNOSIS — N179 Acute kidney failure, unspecified: Secondary | ICD-10-CM | POA: Diagnosis not present

## 2020-11-03 DIAGNOSIS — N184 Chronic kidney disease, stage 4 (severe): Secondary | ICD-10-CM

## 2020-11-03 LAB — POCT HEMOGLOBIN-HEMACUE: Hemoglobin: 8.4 g/dL — ABNORMAL LOW (ref 12.0–15.0)

## 2020-11-03 MED ORDER — EPOETIN ALFA-EPBX 40000 UNIT/ML IJ SOLN
60000.0000 [IU] | INTRAMUSCULAR | Status: DC
  Administered 2020-11-03: 60000 [IU] via SUBCUTANEOUS

## 2020-11-03 MED ORDER — CLONIDINE HCL 0.1 MG PO TABS: 0.1000 mg | ORAL_TABLET | Freq: Once | ORAL | Status: DC | PRN

## 2020-11-03 MED ORDER — EPOETIN ALFA-EPBX 10000 UNIT/ML IJ SOLN
INTRAMUSCULAR | Status: AC
  Filled 2020-11-03: qty 2

## 2020-11-03 MED ORDER — SODIUM CHLORIDE 0.9 % IV SOLN
510.0000 mg | INTRAVENOUS | Status: DC
  Administered 2020-11-03: 510 mg via INTRAVENOUS
  Filled 2020-11-03: qty 510

## 2020-11-03 MED ORDER — EPOETIN ALFA-EPBX 40000 UNIT/ML IJ SOLN
INTRAMUSCULAR | Status: AC
  Filled 2020-11-03: qty 1

## 2020-11-17 ENCOUNTER — Encounter (HOSPITAL_COMMUNITY): Payer: Medicare Other

## 2020-11-17 ENCOUNTER — Other Ambulatory Visit: Payer: Self-pay

## 2020-11-17 ENCOUNTER — Encounter (HOSPITAL_COMMUNITY)
Admission: RE | Admit: 2020-11-17 | Discharge: 2020-11-17 | Disposition: A | Discharge status: Home / Self Care | Payer: Medicare Other | Source: Ambulatory Visit | Attending: Internal Medicine | Admitting: Internal Medicine

## 2020-11-17 VITALS — BP 146/71 | HR 80 | Temp 97.6°F | Resp 20

## 2020-11-17 DIAGNOSIS — N184 Chronic kidney disease, stage 4 (severe): Secondary | ICD-10-CM | POA: Insufficient documentation

## 2020-11-17 DIAGNOSIS — N179 Acute kidney failure, unspecified: Secondary | ICD-10-CM | POA: Insufficient documentation

## 2020-11-17 LAB — POCT HEMOGLOBIN-HEMACUE: Hemoglobin: 9 g/dL — ABNORMAL LOW (ref 12.0–15.0)

## 2020-11-17 MED ORDER — EPOETIN ALFA-EPBX 40000 UNIT/ML IJ SOLN
INTRAMUSCULAR | Status: AC
  Administered 2020-11-17: 40000 [IU] via SUBCUTANEOUS
  Filled 2020-11-17: qty 1

## 2020-11-17 MED ORDER — EPOETIN ALFA-EPBX 40000 UNIT/ML IJ SOLN: 60000.0000 [IU] | INTRAMUSCULAR | Status: DC

## 2020-11-17 MED ORDER — SODIUM CHLORIDE 0.9 % IV SOLN
510.0000 mg | INTRAVENOUS | Status: DC
  Administered 2020-11-17: 510 mg via INTRAVENOUS
  Filled 2020-11-17: qty 510

## 2020-11-17 MED ORDER — EPOETIN ALFA-EPBX 10000 UNIT/ML IJ SOLN
INTRAMUSCULAR | Status: AC
  Administered 2020-11-17: 20000 [IU] via SUBCUTANEOUS
  Filled 2020-11-17: qty 2

## 2020-11-17 MED ORDER — CLONIDINE HCL 0.1 MG PO TABS: 0.1000 mg | ORAL_TABLET | Freq: Once | ORAL | Status: DC | PRN

## 2020-11-30 ENCOUNTER — Other Ambulatory Visit (HOSPITAL_COMMUNITY): Payer: Self-pay | Admitting: *Deleted

## 2020-12-01 ENCOUNTER — Other Ambulatory Visit: Payer: Self-pay

## 2020-12-01 ENCOUNTER — Encounter (HOSPITAL_COMMUNITY)
Admission: RE | Admit: 2020-12-01 | Discharge: 2020-12-01 | Disposition: A | Discharge status: Home / Self Care | Payer: Medicare Other | Source: Ambulatory Visit | Attending: Internal Medicine | Admitting: Internal Medicine

## 2020-12-01 VITALS — BP 123/73 | HR 85 | Temp 97.3°F | Resp 20

## 2020-12-01 DIAGNOSIS — N179 Acute kidney failure, unspecified: Secondary | ICD-10-CM

## 2020-12-01 DIAGNOSIS — N184 Chronic kidney disease, stage 4 (severe): Secondary | ICD-10-CM

## 2020-12-01 LAB — RENAL FUNCTION PANEL
Albumin: 3.3 g/dL — ABNORMAL LOW (ref 3.5–5.0)
Anion gap: 15 (ref 5–15)
BUN: 131 mg/dL — ABNORMAL HIGH (ref 8–23)
CO2: 29 mmol/L (ref 22–32)
Calcium: 9.5 mg/dL (ref 8.9–10.3)
Chloride: 94 mmol/L — ABNORMAL LOW (ref 98–111)
Creatinine, Ser: 3.19 mg/dL — ABNORMAL HIGH (ref 0.44–1.00)
GFR, Estimated: 15 mL/min — ABNORMAL LOW (ref >60)
Glucose, Bld: 133 mg/dL — ABNORMAL HIGH (ref 70–99)
Phosphorus: 4.8 mg/dL — ABNORMAL HIGH (ref 2.5–4.6)
Potassium: 3.4 mmol/L — ABNORMAL LOW (ref 3.5–5.1)
Sodium: 138 mmol/L (ref 135–145)

## 2020-12-01 LAB — IRON AND TIBC
Iron: 60 ug/dL (ref 28–170)
Saturation Ratios: 17 % (ref 10.4–31.8)
TIBC: 357 ug/dL (ref 250–450)
UIBC: 297 ug/dL

## 2020-12-01 LAB — FERRITIN: Ferritin: 196 ng/mL (ref 11–307)

## 2020-12-01 LAB — POCT HEMOGLOBIN-HEMACUE: Hemoglobin: 9.9 g/dL — ABNORMAL LOW (ref 12.0–15.0)

## 2020-12-01 LAB — HEPATITIS B SURFACE ANTIGEN: Hepatitis B Surface Ag: NONREACTIVE

## 2020-12-01 MED ORDER — EPOETIN ALFA-EPBX 40000 UNIT/ML IJ SOLN
INTRAMUSCULAR | Status: AC
  Filled 2020-12-01: qty 1

## 2020-12-01 MED ORDER — EPOETIN ALFA-EPBX 10000 UNIT/ML IJ SOLN
INTRAMUSCULAR | Status: AC
  Filled 2020-12-01: qty 2

## 2020-12-01 MED ORDER — EPOETIN ALFA-EPBX 40000 UNIT/ML IJ SOLN
60000.0000 [IU] | INTRAMUSCULAR | Status: DC
  Administered 2020-12-01: 60000 [IU] via SUBCUTANEOUS

## 2020-12-02 LAB — PTH, INTACT AND CALCIUM
Calcium, Total (PTH): 9.4 mg/dL (ref 8.7–10.3)
PTH: 75 pg/mL — ABNORMAL HIGH (ref 15–65)

## 2020-12-15 ENCOUNTER — Encounter (HOSPITAL_COMMUNITY)
Admission: RE | Admit: 2020-12-15 | Discharge: 2020-12-15 | Disposition: A | Discharge status: Home / Self Care | Payer: Medicare Other | Source: Ambulatory Visit | Attending: Internal Medicine | Admitting: Internal Medicine

## 2020-12-15 ENCOUNTER — Encounter (HOSPITAL_COMMUNITY): Payer: Medicare Other

## 2020-12-15 ENCOUNTER — Other Ambulatory Visit: Payer: Self-pay

## 2020-12-15 VITALS — BP 168/85 | Temp 98.1°F | Resp 20

## 2020-12-15 DIAGNOSIS — N179 Acute kidney failure, unspecified: Secondary | ICD-10-CM | POA: Insufficient documentation

## 2020-12-15 DIAGNOSIS — N184 Chronic kidney disease, stage 4 (severe): Secondary | ICD-10-CM | POA: Diagnosis present

## 2020-12-15 LAB — POCT HEMOGLOBIN-HEMACUE: Hemoglobin: 9.4 g/dL — ABNORMAL LOW (ref 12.0–15.0)

## 2020-12-15 MED ORDER — EPOETIN ALFA-EPBX 40000 UNIT/ML IJ SOLN: 60000.0000 [IU] | INTRAMUSCULAR | Status: DC

## 2020-12-15 MED ORDER — EPOETIN ALFA-EPBX 10000 UNIT/ML IJ SOLN
INTRAMUSCULAR | Status: AC
  Administered 2020-12-15: 20000 [IU] via SUBCUTANEOUS
  Filled 2020-12-15: qty 2

## 2020-12-15 MED ORDER — EPOETIN ALFA-EPBX 40000 UNIT/ML IJ SOLN
INTRAMUSCULAR | Status: AC
  Administered 2020-12-15: 40000 [IU] via SUBCUTANEOUS
  Filled 2020-12-15: qty 1

## 2020-12-26 NOTE — Progress Notes (Incomplete)
HEMATOLOGY/ONCOLOGY CONSULTATION NOTE  Date of Service: 12/26/2020  Patient Care Team: [name], MD as PCP - General (Internal Medicine) Larey Dresser, MD as PCP - Advanced Heart Failure (Cardiology) Lorretta Harp, MD as Consulting Physician (Cardiology) Estanislado Emms, MD as Consulting Physician (Nephrology)/[name]  CHIEF COMPLAINTS/PURPOSE OF CONSULTATION:  Anemia  HISTORY OF PRESENTING ILLNESS:   [name] [name] is a wonderful 70 y.o. female who has been referred to [name] by Conni Elliot RMA   for evaluation and management of anemia.   Patient has had worsening anemia in the setting of ESRD awaiting initiation of hemodialysis. She is followed by [name] for nephrology.She was referred to [name] for evaluation and mx of anemia.  Most recent lab results CBC is as follows: all values are 10/06/2020 Hgb at 7.3  Patient notes increased fatigue and DOE but no overt chest pain or SOB.  Patient notes she might have previously received IV Iron but is not sure. NO weight loss. Does note that she might have received IV iron previously but not sure about EPO.  Interval History  [name] [name] is a wonderful 70 y.o. female who presents today for evaluation and management of anemia. The patient's last visit with [name] was on 11/01/2020. The pt reports that she is doing well overall.  The pt reports ***  Lab results today 12/27/2020 of CBC w/diff and CMP is as follows: all values are WNL except for ***  On review of systems, pt reports *** and denies *** and any other symptoms.  MEDICAL HISTORY:  Past Medical History:  Diagnosis Date  . Anemia   . Arthritis   . Asthma   . Bronchitis   . CHF (congestive heart failure) ([place])   . Chronic kidney disease   . Chronic kidney disease   . Chronic pain syndrome   . COPD (chronic obstructive pulmonary disease) ([place])   . Cramping of feet   . Diabetes mellitus   . Dyspnea   . Full dentures   . GERD (gastroesophageal reflux  disease)   . Gout   . Headache   . History of hiatal hernia   . History of hip replacement, total   . History of transfusion   . Hx MRSA infection    abscess left groin  . Hx of cardiac cath 06/2014  . Hyperlipidemia   . Hypertension   . Hypothyroid   . 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 Morbidly obese ([place])   . Peripheral vascular disease ([place])   . Positive cardiac stress test 12/2013   anterior and lateral ischemia on Myoview  . Sleep apnea    UNABLE TO TOLERATE C PAP  . Stress incontinence   . Thyroid disease   . TIA (transient ischemic attack)     X2 NO RESIDUAL PROBLEMS  . Wears glasses     SURGICAL HISTORY: Past Surgical History:  Procedure Laterality Date  . ABDOMINAL HYSTERECTOMY    . BACK SURGERY  2020  . BASCILIC VEIN TRANSPOSITION Left 04/05/2020   Procedure: BASILIC VEIN TRANSPOSITION FIRST STAGE;  Surgeon: Angelia Mould, MD;  Location: [facility];  Service: Vascular;  Laterality: Left;  . COLON SURGERY  1995   DUE TO POLYP  . DILATION AND CURETTAGE OF UTERUS    . FOREARM FRACTURE SURGERY     Left arm  . HERNIA REPAIR     w/ mesh  . INCISION AND DRAINAGE ABSCESS Left 02/04/2013   Procedure:  INCISION AND DRAINAGE LEFT BUTTOCK ABSCESS; INCISION AND DRAINAGE LEFT BREAST ABSCESS;  Surgeon: Harl Bowie, MD;  Location: [facility];  Service: General;  Laterality: Left;  . INCISION AND DRAINAGE ABSCESS N/A 02/12/2013   Procedure: INCISION AND DEBRIDEMENT BUTTOCK WOUND ;  Surgeon: [name]. Georgette Dover, MD;  Location: [facility];  Service: General;  Laterality: N/A;  . INCISION AND DRAINAGE ABSCESS N/A 02/14/2013   Procedure: INCISION AND DRAINAGE/DRESSING CHANGE;  Surgeon: Harl Bowie, MD;  Location: [facility];  Service: General;  Laterality: N/A;  . INCISION AND DRAINAGE ABSCESS N/A 03/21/2015   Procedure: INCISION AND DRAINAGE PUBIC ABSCESS;  Surgeon: Excell Seltzer, MD;  Location: [facility];  Service: General;  Laterality: N/A;  . INCISION AND DRAINAGE PERIRECTAL  ABSCESS Left 02/10/2013   Procedure: IRRIGATION AND DEBRIDEMENT OF BUTTOCK/PERINEAL ABSCESS;  Surgeon: [name]. Georgette Dover, MD;  Location: [facility];  Service: General;  Laterality: Left;  . INCISION AND DRAINAGE PERIRECTAL ABSCESS N/A 02/16/2013   Procedure: IRRIGATION AND DEBRIDEMENT PERINEAL ABSCESS;  Surgeon: Zenovia Jarred, MD;  Location: [facility];  Service: General;  Laterality: N/A;  . IRRIGATION AND DEBRIDEMENT ABSCESS N/A 02/06/2013   Procedure: IRRIGATION AND DEBRIDEMENT BUTTOCK ABSCESS AND DRESSING CHANGE;  Surgeon: Harl Bowie, MD;  Location: [facility];  Service: General;  Laterality: N/A;  . IRRIGATION AND DEBRIDEMENT ABSCESS Left 02/08/2013   Procedure: IRRIGATION AND DEBRIDEMENT ABSCESS/DRESSING CHANGE;  Surgeon: Gwenyth Ober, MD;  Location: [place];  Service: General;  Laterality: Left;  . JOINT REPLACEMENT  2010 / 2012   . LAPAROSCOPIC CHOLECYSTECTOMY    . LEFT HEART CATHETERIZATION WITH CORONARY ANGIOGRAM N/A 07/02/2014   Procedure: LEFT HEART CATHETERIZATION WITH CORONARY ANGIOGRAM;  Surgeon: Lorretta Harp, MD;  Location: [facility] C[facility];  Service: Cardiovascular;  Laterality: N/A;  . LOWER EXTREMITY ANGIOGRAM Right 04/20/2016   Procedure: Lower Extremity Angiogram;  Surgeon: Elam Dutch, MD;  Location: [facility] CV [facility];  Service: Cardiovascular;  Laterality: Right;  . MULTIPLE TOOTH EXTRACTIONS    . PERIPHERAL VASCULAR CATHETERIZATION N/A 04/20/2016   Procedure: Abdominal Aortogram;  Surgeon: Elam Dutch, MD;  Location: [place] [facility];  Service: Cardiovascular;  Laterality: N/A;  . PERIPHERAL VASCULAR CATHETERIZATION Right 04/20/2016   Procedure: Peripheral Vascular Intervention;  Surgeon: Elam Dutch, MD;  Location: [facility];  Service: Cardiovascular;  Laterality: Right;  popiteal  . RIGHT HEART CATH N/A 10/27/2018   Procedure: RIGHT HEART CATH;  Surgeon: Larey Dresser, MD;  Location: [place] [facility];  Service: Cardiovascular;  Laterality: N/A;  . RIGHT  HEART CATH N/A 03/20/2019   Procedure: RIGHT HEART CATH;  Surgeon: Larey Dresser, MD;  Location: [facility] CV [facility];  Service: Cardiovascular;  Laterality: N/A;  . THYROIDECTOMY    . TOTAL HIP REVISION Right 08/11/2014   Procedure: RIGHT ACETABULAR REVISION;  Surgeon: Gearlean Alf, MD;  Location[facility];  Service: Orthopedics;  Laterality: Right;  [name] [name] Kitchen VASCULAR SURGERY      SOCIAL HISTORY: Social History   Socioeconomic History  . Marital status: Legally Separated    Spouse name: Not on file  . Number of children: 1  . Years of education: college  . Highest education level: Not on file  Occupational History    Comment: Disabled  Tobacco Use  . Smoking status: Current Some Day Smoker    Packs/day: 0.25    Years: 40.00    Pack years: 10.00    Types: Cigarettes  .  Smokeless tobacco: Never Used  Vaping Use  . Vaping Use: Never used  Substance and Sexual Activity  . Alcohol use: Yes    Alcohol/week: 1.0 standard drink    Types: 1 Standard drinks or equivalent per week    Comment: OCC  . Drug use: No  . Sexual activity: Not on file  Other Topics Concern  . Not on file  Social History Narrative   Patient is separated  and lives at home with her friend. Patient is disabled.   Education one year of college   Right handed   Caffeine two cups daily.      Social Determinants of Health   Financial Resource Strain: Not on file  Food Insecurity: Not on file  Transportation Needs: Not on file  Physical Activity: Not on file  Stress: Not on file  Social Connections: Not on file  Intimate Partner Violence: Not on file    FAMILY HISTORY: Family History  Problem Relation Age of Onset  . Diabetes Brother   . Cardiomyopathy Mother   . Heart disease Mother   . Cancer Father     ALLERGIES:  is allergic to nebivolol, ace inhibitors, and morphine and related.  MEDICATIONS:  Current Outpatient Medications  Medication Sig Dispense Refill  . albuterol (PROVENTIL) (2.5 MG/3ML)  0.083% nebulizer solution Take 2.5 mg by nebulization every 6 (six) hours as needed for wheezing or shortness of breath.    [name] [name] Kitchen albuterol (VENTOLIN HFA) 108 (90 Base) MCG/ACT inhaler Inhale 2 puffs into the lungs 4 (four) times daily as needed for wheezing or shortness of breath.    . allopurinol (ZYLOPRIM) 100 MG tablet Take 100 mg by mouth daily.    . B Complex Vitamins (VITAMIN B COMPLEX PO) Take 1 tablet by mouth daily.    . budesonide-formoterol (SYMBICORT) 160-4.5 MCG/ACT inhaler Inhale 2 puffs into the lungs 2 (two) times daily as needed (for SOB).     . carvedilol (COREG) 12.5 MG tablet Take 12.5 mg by mouth 2 (two) times daily.    . Cholecalciferol (VITAMIN D) 50 MCG (2000 UT) CAPS Take 2,000 Units by mouth daily.    . cinacalcet (SENSIPAR) 30 MG tablet Take 30 mg by mouth daily.    . clopidogrel (PLAVIX) 75 MG tablet TAKE 1 TABLET BY MOUTH EVERY DAY (Patient taking differently: Take 75 mg by mouth daily.) 30 tablet 0  . clotrimazole-betamethasone (LOTRISONE) cream Apply 1 application topically 2 (two) times daily as needed (rash/itching).    [name] [name] Kitchen diclofenac Sodium (VOLTAREN) 1 % GEL Apply 1 application topically 4 (four) times daily as needed (pain).    [name] [name] Kitchen diphenhydrAMINE (BENADRYL) 25 MG tablet Take 25 mg by mouth every 6 (six) hours as needed for allergies.    . Dulaglutide (TRULICITY) 1.5 KK/9.3GH SOPN Inject 1.5 mg into the skin every Monday.    . fluticasone (FLONASE) 50 MCG/ACT nasal spray Place 2 sprays into the nose as needed for allergies.     . hydrOXYzine (ATARAX/VISTARIL) 25 MG tablet Take 25 mg by mouth 3 (three) times daily as needed for itching.    . insulin lispro protamine-lispro (HUMALOG 75/25 MIX) (75-25) 100 UNIT/ML SUSP injection Inject 30-40 Units into the skin See admin instructions. Inject 40 units into the skin morning and 30 units in the evening    . levothyroxine (SYNTHROID, LEVOTHROID) 150 MCG tablet Take 150 mcg by mouth daily before breakfast.    . lubiprostone  (AMITIZA) 24 MCG capsule Take 24 mcg by mouth 2 (two)  times daily.    . magnesium oxide (MAG-OX) 400 (241.3 Mg) MG tablet Take 1 tablet (400 mg total) by mouth 2 (two) times daily. (Patient taking differently: Take 400 mg by mouth daily.) 30 tablet 0  . metolazone (ZAROXOLYN) 5 MG tablet Take 5 mg by mouth every Monday, Wednesday, and Friday.    . mirabegron ER (MYRBETRIQ) 50 MG TB24 tablet Take 50 mg by mouth at bedtime.    . Multiple Vitamin (MULTIVITAMIN WITH MINERALS) TABS tablet Take 1 tablet by mouth daily.    [name] [name] Kitchen oxyCODONE-acetaminophen (PERCOCET) 10-325 MG tablet Take 1 tablet by mouth every 6 (six) hours as needed for pain. (Patient taking differently: Take 1 tablet by mouth every 6 (six) hours.) 10 tablet 0  . pantoprazole (PROTONIX) 40 MG tablet Take 40 mg by mouth daily.    . polyvinyl alcohol (LIQUIFILM TEARS) 1.4 % ophthalmic solution Place 2 drops into both eyes daily as needed for dry eyes.    . Potassium Chloride ER 20 MEQ TBCR Take 20 mEq by mouth See admin instructions. Take one tablet (20 meq) by mouth three times weekly - Tuesday, Thursday, Saturday    . tapentadol (NUCYNTA) 50 MG tablet Take 50 mg by mouth at bedtime.    . torsemide (DEMADEX) 100 MG tablet Take 100 mg by mouth 2 (two) times daily.     No current facility-administered medications for this visit.    REVIEW OF SYSTEMS:   10 Point review of Systems was done is negative except as noted above.   PHYSICAL EXAMINATION: ECOG PERFORMANCE STATUS: 1 - Symptomatic but completely ambulatory  .There were no vitals filed for this visit. There were no vitals filed for this visit. .There is no height or weight on file to calculate BMI.  *** GENERAL:alert, in no acute distress and comfortable SKIN: no acute rashes, no significant lesions EYES: conjunctiva are pink and non-injected, sclera anicteric OROPHARYNX: MMM, no exudates, no oropharyngeal erythema or ulceration NECK: supple, no JVD LYMPH:  no palpable  lymphadenopathy in the cervical, axillary or inguinal regions LUNGS: clear to auscultation b/l with normal respiratory effort HEART: regular rate & rhythm ABDOMEN:  normoactive bowel sounds , non tender, not distended. Extremity: no pedal edema PSYCH: alert & oriented x 3 with fluent speech NEURO: no focal motor/sensory deficits  LABORATORY DATA:  I have reviewed the data as listed  . CBC Latest Ref Rng & Units 12/15/2020 12/01/2020 11/17/2020  WBC 4.0 - 10.5 K/uL - - -  Hemoglobin 12.0 - 15.0 g/dL 9.4(L) 9.9(L) 9.0(L)  Hematocrit 36.0 - 46.0 % - - -  Platelets 150 - 400 K/uL - - -    . CMP Latest Ref Rng & Units 12/01/2020 12/01/2020 10/17/2020  Glucose 70 - 99 mg/dL 133(H) - 197(H)  BUN 8 - 23 mg/dL 131(H) - 151(H)  Creatinine 0.44 - 1.00 mg/dL 3.19(H) - 3.57(H)  Sodium 135 - 145 mmol/L 138 - 134(L)  Potassium 3.5 - 5.1 mmol/L 3.4(L) - 3.4(L)  Chloride 98 - 111 mmol/L 94(L) - 90(L)  CO2 22 - 32 mmol/L 29 - 28  Calcium 8.7 - 10.3 mg/dL 9.5 9.4 9.3  Total Protein 6.5 - 8.1 g/dL - - 6.9  Total Bilirubin 0.3 - 1.2 mg/dL - - 0.5  Alkaline Phos 38 - 126 U/L - - 72  AST 15 - 41 U/L - - 17  ALT 0 - 44 U/L - - 15     RADIOGRAPHIC STUDIES: I have personally reviewed the radiological images as  listed and agreed with the findings in the report. No results found.  ASSESSMENT & PLAN:   70 yo with   1) Severe anemia - likely related to ESRD.?functional iron def + uremic bone marrow suppression. 2) CKD due to HTN, DM2 etc. 3) . Patient Active Problem List   Diagnosis Date Noted  . Anemia, unspecified 01/08/2020  . Bacteremia 01/07/2020  . UTI (urinary tract infection) 01/06/2020  . Anemia 10/26/2019  . S/P lumbar fusion 09/18/2019  . Chronic CHF (congestive heart failure) ([place]) 03/16/2019  . Leukocytosis 03/16/2019  . Chest pain 11/27/2018  . Pre-syncope 11/27/2018  . Epigastric abdominal pain 11/27/2018  . Solitary pulmonary nodule 11/06/2018  . 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 CHF (congestive heart failure), NYHA class III, acute on chronic, systolic ([place]) 22/48/2500  . 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, unspecified extremity ([place]) 04/19/2016  . Cellulitis of abdominal wall 03/21/2015  . Acute on chronic renal failure ([place]) 03/21/2015  . DM (diabetes mellitus), type 2 with renal complications ([place]) 37/01/8888  . Abdominal wall abscess 03/21/2015  . 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 Low urine output 08/11/2014  . Acute on chronic systolic CHF (congestive heart failure), NYHA class 3 ([place]) 08/11/2014  . Asthma, chronic 08/11/2014  . Hypothyroidism 08/11/2014  . OSA (obstructive sleep apnea) 08/11/2014  . Positive cardiac stress test 06/29/2014  . Hyperlipidemia 02/12/2014  . Left arm weakness 12/23/2013  . TIA (transient ischemic attack) 12/23/2013  . Left buttock abscess 10/12/2013  . Cellulitis and abscess of leg, right 04/16/2013  . Acute blood loss anemia 02/14/2013  . HTN (hypertension) 02/14/2013  . Cellulitis and abscess of buttock 02/09/2013  . Morbid obesity ([place]) 02/03/2013  . Diarrhea 02/03/2013  . Hyponatremia 02/03/2013  . DM (diabetes mellitus) ([place]) 02/02/2013  . CKD (chronic kidney disease), stage IV ([place]) 02/02/2013  . Cellulitis 02/02/2013  . PAD (peripheral artery disease) ([place]) 04/26/2011  . Tobacco abuse 04/26/2011   PLAN: -Discussed labs from today, 12/27/2020; ***   -Will see back in ***  No orders of the defined types were placed in this encounter.   FOLLOW UP: ***   All of the patients questions were answered with apparent satisfaction. The patient knows to call the clinic with any problems, questions or concerns.  The total time spent in the appointment was *** minutes and more than 50% was on counseling and direct patient cares.     Sullivan Lone MD [place] AAHIVMS [facility]  [facility] Hematology/Oncology Physician [facility]  (Office):       [phone] (Work cell):  [phone] (Fax):           [phone]  12/26/2020 2:10 PM  I, Reinaldo Raddle, am acting as scribe for Dr. Sullivan Lone, MD.

## 2020-12-27 ENCOUNTER — Inpatient Hospital Stay: Payer: Medicare Other | Admitting: Hematology

## 2020-12-27 ENCOUNTER — Inpatient Hospital Stay: Payer: Medicare Other | Attending: Hematology

## 2020-12-29 ENCOUNTER — Other Ambulatory Visit: Payer: Self-pay

## 2020-12-29 ENCOUNTER — Encounter (HOSPITAL_COMMUNITY)
Admission: RE | Admit: 2020-12-29 | Discharge: 2020-12-29 | Disposition: A | Discharge status: Home / Self Care | Payer: Medicare Other | Source: Ambulatory Visit | Attending: Internal Medicine | Admitting: Internal Medicine

## 2020-12-29 VITALS — BP 148/70 | HR 88 | Temp 98.4°F | Resp 20

## 2020-12-29 DIAGNOSIS — N179 Acute kidney failure, unspecified: Secondary | ICD-10-CM | POA: Diagnosis not present

## 2020-12-29 DIAGNOSIS — N184 Chronic kidney disease, stage 4 (severe): Secondary | ICD-10-CM

## 2020-12-29 LAB — IRON AND TIBC
Iron: 24 ug/dL — ABNORMAL LOW (ref 28–170)
Saturation Ratios: 6 % — ABNORMAL LOW (ref 10.4–31.8)
TIBC: 379 ug/dL (ref 250–450)
UIBC: 355 ug/dL

## 2020-12-29 LAB — POCT HEMOGLOBIN-HEMACUE: Hemoglobin: 9.4 g/dL — ABNORMAL LOW (ref 12.0–15.0)

## 2020-12-29 LAB — FERRITIN: Ferritin: 27 ng/mL (ref 11–307)

## 2020-12-29 MED ORDER — EPOETIN ALFA-EPBX 10000 UNIT/ML IJ SOLN
INTRAMUSCULAR | Status: AC
  Administered 2020-12-29: 20000 [IU]
  Filled 2020-12-29: qty 2

## 2020-12-29 MED ORDER — EPOETIN ALFA-EPBX 40000 UNIT/ML IJ SOLN: 60000.0000 [IU] | INTRAMUSCULAR | Status: DC

## 2020-12-29 MED ORDER — EPOETIN ALFA-EPBX 40000 UNIT/ML IJ SOLN
INTRAMUSCULAR | Status: AC
  Administered 2020-12-29: 40000 [IU] via SUBCUTANEOUS
  Filled 2020-12-29: qty 1

## 2021-01-05 ENCOUNTER — Emergency Department (HOSPITAL_COMMUNITY): Payer: Medicare Other

## 2021-01-05 ENCOUNTER — Encounter (HOSPITAL_COMMUNITY): Payer: Self-pay | Admitting: Emergency Medicine

## 2021-01-05 ENCOUNTER — Emergency Department (HOSPITAL_COMMUNITY)
Admission: EM | Admit: 2021-01-05 | Discharge: 2021-01-05 | Disposition: A | Discharge status: Home / Self Care | Payer: Medicare Other | Attending: Emergency Medicine | Admitting: Emergency Medicine

## 2021-01-05 ENCOUNTER — Other Ambulatory Visit: Payer: Self-pay

## 2021-01-05 DIAGNOSIS — I110 Hypertensive heart disease with heart failure: Secondary | ICD-10-CM | POA: Diagnosis not present

## 2021-01-05 DIAGNOSIS — R55 Syncope and collapse: Secondary | ICD-10-CM | POA: Diagnosis not present

## 2021-01-05 DIAGNOSIS — H5789 Other specified disorders of eye and adnexa: Secondary | ICD-10-CM | POA: Insufficient documentation

## 2021-01-05 DIAGNOSIS — S8002XA Contusion of left knee, initial encounter: Secondary | ICD-10-CM | POA: Insufficient documentation

## 2021-01-05 DIAGNOSIS — I509 Heart failure, unspecified: Secondary | ICD-10-CM | POA: Insufficient documentation

## 2021-01-05 DIAGNOSIS — S0990XA Unspecified injury of head, initial encounter: Secondary | ICD-10-CM | POA: Diagnosis present

## 2021-01-05 DIAGNOSIS — R5383 Other fatigue: Secondary | ICD-10-CM | POA: Insufficient documentation

## 2021-01-05 DIAGNOSIS — J449 Chronic obstructive pulmonary disease, unspecified: Secondary | ICD-10-CM | POA: Diagnosis not present

## 2021-01-05 DIAGNOSIS — Z7902 Long term (current) use of antithrombotics/antiplatelets: Secondary | ICD-10-CM | POA: Diagnosis not present

## 2021-01-05 DIAGNOSIS — W19XXXA Unspecified fall, initial encounter: Secondary | ICD-10-CM | POA: Diagnosis not present

## 2021-01-05 DIAGNOSIS — S0093XA Contusion of unspecified part of head, initial encounter: Secondary | ICD-10-CM | POA: Diagnosis not present

## 2021-01-05 DIAGNOSIS — T1490XA Injury, unspecified, initial encounter: Secondary | ICD-10-CM

## 2021-01-05 HISTORY — DX: Essential (primary) hypertension: I10

## 2021-01-05 HISTORY — DX: Type 2 diabetes mellitus without complications: E11.9

## 2021-01-05 LAB — COMPREHENSIVE METABOLIC PANEL
ALT: 14 U/L (ref 0–44)
AST: 22 U/L (ref 15–41)
Albumin: 3.3 g/dL — ABNORMAL LOW (ref 3.5–5.0)
Alkaline Phosphatase: 83 U/L (ref 38–126)
Anion gap: 16 — ABNORMAL HIGH (ref 5–15)
BUN: 208 mg/dL — ABNORMAL HIGH (ref 8–23)
CO2: 28 mmol/L (ref 22–32)
Calcium: 9.3 mg/dL (ref 8.9–10.3)
Chloride: 86 mmol/L — ABNORMAL LOW (ref 98–111)
Creatinine, Ser: 3.74 mg/dL — ABNORMAL HIGH (ref 0.44–1.00)
GFR, Estimated: 12 mL/min — ABNORMAL LOW (ref >60)
Glucose, Bld: 196 mg/dL — ABNORMAL HIGH (ref 70–99)
Potassium: 3.3 mmol/L — ABNORMAL LOW (ref 3.5–5.1)
Sodium: 130 mmol/L — ABNORMAL LOW (ref 135–145)
Total Bilirubin: 0.7 mg/dL (ref 0.3–1.2)
Total Protein: 7.2 g/dL (ref 6.5–8.1)

## 2021-01-05 LAB — CBC WITH DIFFERENTIAL/PLATELET
Abs Immature Granulocytes: 0.11 10*3/uL — ABNORMAL HIGH (ref 0.00–0.07)
Basophils Absolute: 0 10*3/uL (ref 0.0–0.1)
Basophils Relative: 0 %
Eosinophils Absolute: 0.1 10*3/uL (ref 0.0–0.5)
Eosinophils Relative: 0 %
HCT: 31 % — ABNORMAL LOW (ref 36.0–46.0)
Hemoglobin: 9.6 g/dL — ABNORMAL LOW (ref 12.0–15.0)
Immature Granulocytes: 1 %
Lymphocytes Relative: 10 %
Lymphs Abs: 1.8 10*3/uL (ref 0.7–4.0)
MCH: 25.6 pg — ABNORMAL LOW (ref 26.0–34.0)
MCHC: 31 g/dL (ref 30.0–36.0)
MCV: 82.7 fL (ref 80.0–100.0)
Monocytes Absolute: 0.8 10*3/uL (ref 0.1–1.0)
Monocytes Relative: 5 %
Neutro Abs: 14.1 10*3/uL — ABNORMAL HIGH (ref 1.7–7.7)
Neutrophils Relative %: 84 %
Platelets: 399 10*3/uL (ref 150–400)
RBC: 3.75 MIL/uL — ABNORMAL LOW (ref 3.87–5.11)
RDW: 19.1 % — ABNORMAL HIGH (ref 11.5–15.5)
WBC: 16.9 10*3/uL — ABNORMAL HIGH (ref 4.0–10.5)
nRBC: 0 % (ref 0.0–0.2)

## 2021-01-05 LAB — URINALYSIS, ROUTINE W REFLEX MICROSCOPIC
Bilirubin Urine: NEGATIVE
Glucose, UA: NEGATIVE mg/dL
Hgb urine dipstick: NEGATIVE
Ketones, ur: NEGATIVE mg/dL
Leukocytes,Ua: NEGATIVE
Nitrite: NEGATIVE
Protein, ur: NEGATIVE mg/dL
Specific Gravity, Urine: 1.01 (ref 1.005–1.030)
pH: 5 (ref 5.0–8.0)

## 2021-01-05 LAB — TSH: TSH: 1.518 u[IU]/mL (ref 0.350–4.500)

## 2021-01-05 LAB — PROTIME-INR
INR: 1 (ref 0.8–1.2)
Prothrombin Time: 13 seconds (ref 11.4–15.2)

## 2021-01-05 LAB — CBG MONITORING, ED: Glucose-Capillary: 203 mg/dL — ABNORMAL HIGH (ref 70–99)

## 2021-01-05 LAB — BRAIN NATRIURETIC PEPTIDE: B Natriuretic Peptide: 151.3 pg/mL — ABNORMAL HIGH (ref 0.0–100.0)

## 2021-01-05 NOTE — ED Notes (Signed)
Called Ray, pts soon, per her request to be picked up for discharge

## 2021-01-05 NOTE — ED Triage Notes (Signed)
Pt BIB [facility] c/o fall. Pt stated she stood up too fast, got dizzy, fell, and hit her head. Pt takes plavix. Pt on floor 20 mins prior to EMS arrival. Pt has hematoma to L eye with no change in size since EMS arrival. Pt c/o R knee pain and head pain. Pt denies neck or back pain.   EMS VS 158/86, HR 80, Spo2 97%, CBG 229

## 2021-01-05 NOTE — Discharge Instructions (Signed)
Your history and exam today were consistent with lightheadedness and a passing out episode after you stood up after using the bathroom today.  The CT scan did not show any acute fractures or bleeding inside the head.  You do have a hematoma on your forehead but no other concerning findings of injury.  We did screening labs and it was all very similar to prior values.  We had a discussion that given your heart failure, we considered admission for high risk syncope work-up however as you are feeling better and we did not find any other concerning findings, you wanted to try going home.  Please rest and stay hydrated and be careful standing up quickly.  Please call and follow-up with your primary doctor in the neck several days.  If any symptoms change or worsen acutely, please return to the nearest emergency department.

## 2021-01-05 NOTE — ED Notes (Signed)
Discharge instructions discussed with pt. Pt verbalized understanding with no questions at this time. Pt to go home with son ray.

## 2021-01-05 NOTE — Progress Notes (Signed)
Orthopedic Tech Progress Note Patient Details:  [name] 1951-01-05 [id number] Level 2 trauma Patient ID: [name] [name], female   DOB: 02-Dec-1950, 70 y.o.   MRN: [id number]   [name] [name] 01/05/2021, 4:54 PM

## 2021-01-05 NOTE — ED Provider Notes (Signed)
[facility] EMERGENCY DEPARTMENT Provider Note   CSN: [id number] Arrival date & time: 01/05/21  1629     History Chief Complaint  Patient presents with  . Fall    [name] [name] is a 70 y.o. female.  The history is provided by the patient and medical records. No language interpreter was used.  Fall This is a new problem. The current episode started 1 to 2 hours ago. The problem occurs rarely. The problem has been resolved. Associated symptoms include headaches. Pertinent negatives include no chest pain, no abdominal pain and no shortness of breath. Nothing aggravates the symptoms. Nothing relieves the symptoms. She has tried nothing for the symptoms. The treatment provided no relief.  Loss of Consciousness Episode history:  Single Most recent episode:  Today Timing:  Rare Progression:  Resolved Chronicity:  New Context: standing up   Associated symptoms: headaches and malaise/fatigue   Associated symptoms: no chest pain, no diaphoresis, no fever, no nausea, no palpitations, no shortness of breath, no vomiting and no weakness        No past medical history on file.  There are no problems to display for this patient.      OB History   No obstetric history on file.     No family history on file.     Home Medications Prior to Admission medications   Not on File    Allergies    Patient has no allergy information on record.  Review of Systems   Review of Systems  Constitutional: Positive for fatigue and malaise/fatigue. Negative for chills, diaphoresis and fever.  HENT: Negative for congestion.   Eyes: Negative for visual disturbance.  Respiratory: Negative for cough, chest tightness, shortness of breath and wheezing.   Cardiovascular: Positive for syncope. Negative for chest pain and palpitations.  Gastrointestinal: Negative for abdominal pain, constipation, diarrhea, nausea and vomiting.  Genitourinary: Negative for dysuria, flank pain  and frequency.  Musculoskeletal: Negative for back pain, neck pain and neck stiffness.  Skin: Negative for rash and wound.  Neurological: Positive for syncope, light-headedness and headaches. Negative for weakness and numbness.  Psychiatric/Behavioral: Negative for agitation.  All other systems reviewed and are negative.   Physical Exam Updated Vital Signs BP (!) 158/75   Pulse 73   Temp 98 F (36.7 C) (Temporal)   Resp 16   Ht 5\' 4"  (1.626 m)   Wt 96.2 kg   SpO2 97%   BMI 36.39 kg/m   Physical Exam Vitals and nursing note reviewed.  Constitutional:      General: She is not in acute distress.    Appearance: She is well-developed. She is not ill-appearing, toxic-appearing or diaphoretic.  HENT:     Head: Abrasion and contusion present. No laceration.      Nose: Nose normal. No congestion or rhinorrhea.     Mouth/Throat:     Mouth: Mucous membranes are moist.     Pharynx: No oropharyngeal exudate or posterior oropharyngeal erythema.  Eyes:     Extraocular Movements: Extraocular movements intact.     Right eye: Normal extraocular motion.     Left eye: Normal extraocular motion.     Conjunctiva/sclera:     Right eye: Right conjunctiva is not injected.     Left eye: Left conjunctiva is not injected. Hemorrhage present.   Cardiovascular:     Rate and Rhythm: Normal rate and regular rhythm.     Heart sounds: No murmur heard.   Pulmonary:  Effort: Pulmonary effort is normal. No respiratory distress.     Breath sounds: Normal breath sounds.  Abdominal:     General: Abdomen is flat.     Palpations: Abdomen is soft.     Tenderness: There is no abdominal tenderness. There is no right CVA tenderness, left CVA tenderness, guarding or rebound.  Musculoskeletal:        General: Swelling, tenderness and signs of injury present.     Cervical back: Neck supple. No tenderness.     Left knee: Tenderness present.     Right lower leg: Swelling present. No edema.     Left lower  leg: No edema.       Legs:     Comments: Intact pulse and sensation in legs.  Right leg swelling which she reports is at its baseline or improved.  Skin:    General: Skin is warm and dry.     Findings: No erythema.  Neurological:     General: No focal deficit present.     Mental Status: She is alert.  Psychiatric:        Mood and Affect: Mood normal.     ED Results / Procedures / Treatments   Labs (all labs ordered are listed, but only abnormal results are displayed) Labs Reviewed  CBC WITH DIFFERENTIAL/PLATELET - Abnormal; Notable for the following components:      Result Value   WBC 16.9 (*)    RBC 3.75 (*)    Hemoglobin 9.6 (*)    HCT 31.0 (*)    MCH 25.6 (*)    RDW 19.1 (*)    Neutro Abs 14.1 (*)    Abs Immature Granulocytes 0.11 (*)    All other components within normal limits  COMPREHENSIVE METABOLIC PANEL - Abnormal; Notable for the following components:   Sodium 130 (*)    Potassium 3.3 (*)    Chloride 86 (*)    Glucose, Bld 196 (*)    BUN 208 (*)    Creatinine, Ser 3.74 (*)    Albumin 3.3 (*)    GFR, Estimated 12 (*)    Anion gap 16 (*)    All other components within normal limits  BRAIN NATRIURETIC PEPTIDE - Abnormal; Notable for the following components:   B Natriuretic Peptide 151.3 (*)    All other components within normal limits  URINALYSIS, ROUTINE W REFLEX MICROSCOPIC - Abnormal; Notable for the following components:   APPearance HAZY (*)    All other components within normal limits  CBG MONITORING, ED - Abnormal; Notable for the following components:   Glucose-Capillary 203 (*)    All other components within normal limits  URINE CULTURE  TSH  PROTIME-INR    EKG EKG Interpretation  Date/Time:  Thursday January 05 2021 16:35:38 EDT Ventricular Rate:  84 PR Interval:  168 QRS Duration: 114 QT Interval:  411 QTC Calculation: 486 R Axis:   -8 Text Interpretation: Sinus rhythm Ventricular premature complex LVH with IVCD and secondary repol  abnrm Borderline prolonged QT interval No priorECG for comparison.  wandering baseline. No STEMI Confirmed by Antony Blackbird [phone]) on 01/05/2021 4:40:24 PM   Radiology CT Head Wo Contrast  Result Date: 01/05/2021 CLINICAL DATA:  Status post fall with facial trauma. EXAM: CT HEAD WITHOUT CONTRAST TECHNIQUE: Contiguous axial images were obtained from the base of the skull through the vertex without intravenous contrast. COMPARISON:  None. FINDINGS: Brain: No evidence of acute infarction, hemorrhage, hydrocephalus, extra-axial collection or mass lesion/mass effect. Mild to  moderate deep white matter microangiopathy. Vascular: Calcific atherosclerotic disease of the intra cavernous carotid arteries. Skull: Normal. Negative for fracture or focal lesion. Sinuses/Orbits: No acute finding. Other: None. IMPRESSION: 1. No acute intracranial abnormality. 2. Mild to moderate deep white matter microangiopathy. Electronically Signed   By: Fidela Salisbury M.D.   On: 01/05/2021 16:59   DG Chest Port 1 View  Result Date: 01/05/2021 CLINICAL DATA:  Trauma. EXAM: PORTABLE CHEST 1 VIEW COMPARISON:  Radiograph 01/07/2020, CT 08/10/2019 FINDINGS: Mild cardiomegaly. Stable mediastinal contours. Aortic atherosclerosis. No focal airspace disease, pleural effusion, or pneumothorax. Rounded right apical density corresponds to extrapleural fat on prior CT. No visualized rib fracture or acute osseous abnormalities. Bones are under mineralized. IMPRESSION: Mild cardiomegaly. No acute findings or evidence of acute traumatic injury. Electronically Signed   By: Keith Rake M.D.   On: 01/05/2021 16:48   DG Knee Complete 4 Views Left  Result Date: 01/05/2021 CLINICAL DATA:  Fall. EXAM: LEFT KNEE - COMPLETE 4+ VIEW COMPARISON:  None. FINDINGS: No evidence of fracture, dislocation, or joint effusion. Moderate osteoarthritis with tricompartmental joint space narrowing and peripheral spurring. Vascular calcifications. IMPRESSION:  1. No acute fracture or subluxation. 2. Moderate osteoarthritis. Electronically Signed   By: Keith Rake M.D.   On: 01/05/2021 17:01   CT Maxillofacial Wo Contrast  Result Date: 01/05/2021 CLINICAL DATA:  Status post fall with facial trauma. EXAM: CT MAXILLOFACIAL WITHOUT CONTRAST TECHNIQUE: Multidetector CT imaging of the maxillofacial structures was performed. Multiplanar CT image reconstructions were also generated. COMPARISON:  None. FINDINGS: Osseous: No fracture or mandibular dislocation. No destructive process. Orbits: Negative. No traumatic or inflammatory finding. Sinuses: Clear. Soft tissues: Left periorbital preseptal soft tissue hematoma. Limited intracranial: No significant or unexpected finding. IMPRESSION: 1. No evidence of facial fractures. 2. Left periorbital preseptal soft tissue hematoma. Electronically Signed   By: Fidela Salisbury M.D.   On: 01/05/2021 17:03    Procedures Procedures   Medications Ordered in ED Medications - No data to display  ED Course  I have reviewed the triage vital signs and the nursing notes.  Pertinent labs & imaging results that were available during my care of the patient were reviewed by me and considered in my medical decision making (see chart for details).    MDM Rules/Calculators/A&P                          DEONI COSEY is a 70 y.o. female with a past medical history sniffing for hypertension, COPD, GERD, CHF, kidney disease currently nearing dialysis who presents with head trauma after syncopal episode.  Patient says that she has been feeling tired for the last few days.  She says that today she was using the restroom and after standing up very lightheaded and then passed out hitting her left forehead on the ground as well as her left knee.  She reports she laid on the ground for around 20 minutes before they were able to get help.  She denies any chest pain or abdominal pain.  Denies any back pain or flank pain.  Denies any hip  pains.  She does report some mild left knee pain.  She reports her right leg is all swollen and it is actually reportedly doing better than normal.  She denies any significant neck pain or neck stiffness.  She denies nausea, vomiting, double vision.  she does report mild headache.  As patient takes Plavix, she was activated as a level  2 trauma for head injury after a fall.  On exam, lungs clear, chest nontender.  Abdomen nontender.  Hips nontender.  Right leg nontender.  Both lower extremities have palpable pulses.  Left knee is tender to palpation with some mild bruising.  Neck is nontender.  Pupil symmetric and normal extraocular movement.  Clear speech.  Normal sensation of the face.  Patient has bruising around her left eyebrow with no significant laceration.  Some mild some conjunctival hemorrhage in the medial left eye.  Normal extraocular movements with no evidence of entrapment.  No other orbital tenderness aside from the upper area.  Exam otherwise unremarkable.  We will get CT head and face due to the fall and the bruising seen on Plavix.  Will get x-ray of the knee due to knee pain.  However, given his reported fatigue over the last several days and the syncopal episode, we will get screening labs to look for occult infection or other abnormalities that may have led to the syncopal episode.  Anticipate reassessment after work-up to determine disposition.  Patient's work-up returned and was overall somewhat similar to prior.  Her urinalysis does not show convincing evidence of infection.  BNP is improved from prior 151.  CBC shows a mild leukocytosis and similar anemia to prior.  Her creatinine is similar to prior at 3.74 as she is getting closer to dialysis.  Her electrolytes are slightly different with decreased sodium and chloride.  Suspect some mild dehydration contributing to her syncopal near syncopal episode.  TSH normal.  CT head did not show acute injury and imaging of her chest and knee  were also reassuring.  Suspect orthostatic syncope after standing up after using the bathroom.  Given the patient's heart failure and kidney failure, we discussed that this is a high risk syncopal episode and she may benefit from admission and observation and monitoring but she would rather go home as she is feeling better.  She understands there is follow-up with her primary doctor in the neck several days and call them tomorrow to discuss further management.  She understands return precautions and follow-up instructions.  She no other questions or concerns and was discharged in good condition after improved symptoms and reassuring work-up thus far.   Final Clinical Impression(s) / ED Diagnoses Final diagnoses:  Fall, initial encounter  Syncope, unspecified syncope type  Injury of head, initial encounter    Rx / DC Orders ED Discharge Orders    None      Clinical Impression: 1. Fall, initial encounter   2. Trauma   3. Syncope, unspecified syncope type   4. Injury of head, initial encounter     Disposition: Discharge  Condition: Good  I have discussed the results, Dx and Tx plan with the pt(& family if present). He/she/they expressed understanding and agree(s) with the plan. Discharge instructions discussed at great length. Strict return precautions discussed and pt &/or family have verbalized understanding of the instructions. No further questions at time of discharge.    Discharge Medication List as of 01/05/2021 10:52 PM      Follow Up: [name] [address] [phone]     [facility] EMERGENCY DEPARTMENT [address] [id number] mc [place]       Hanadi Stanly, Gwenyth Allegra, MD 01/05/21 [phone]

## 2021-01-06 ENCOUNTER — Encounter (HOSPITAL_COMMUNITY): Payer: Self-pay

## 2021-01-07 LAB — URINE CULTURE

## 2021-01-20 ENCOUNTER — Other Ambulatory Visit (HOSPITAL_COMMUNITY): Payer: Self-pay | Admitting: *Deleted

## 2021-01-23 ENCOUNTER — Encounter (HOSPITAL_COMMUNITY)
Admission: RE | Admit: 2021-01-23 | Discharge: 2021-01-23 | Disposition: A | Discharge status: Home / Self Care | Payer: Medicare Other | Source: Ambulatory Visit | Attending: Internal Medicine | Admitting: Internal Medicine

## 2021-01-23 ENCOUNTER — Other Ambulatory Visit: Payer: Self-pay

## 2021-01-23 VITALS — BP 130/73 | HR 82 | Temp 97.5°F | Resp 20

## 2021-01-23 DIAGNOSIS — N179 Acute kidney failure, unspecified: Secondary | ICD-10-CM | POA: Insufficient documentation

## 2021-01-23 DIAGNOSIS — N184 Chronic kidney disease, stage 4 (severe): Secondary | ICD-10-CM

## 2021-01-23 LAB — FERRITIN: Ferritin: 14 ng/mL (ref 11–307)

## 2021-01-23 LAB — IRON AND TIBC
Iron: 18 ug/dL — ABNORMAL LOW (ref 28–170)
Saturation Ratios: 4 % — ABNORMAL LOW (ref 10.4–31.8)
TIBC: 416 ug/dL (ref 250–450)
UIBC: 398 ug/dL

## 2021-01-23 LAB — POCT HEMOGLOBIN-HEMACUE: Hemoglobin: 7.5 g/dL — ABNORMAL LOW (ref 12.0–15.0)

## 2021-01-23 MED ORDER — EPOETIN ALFA-EPBX 40000 UNIT/ML IJ SOLN: 60000.0000 [IU] | INTRAMUSCULAR | Status: DC

## 2021-01-23 MED ORDER — EPOETIN ALFA-EPBX 40000 UNIT/ML IJ SOLN
INTRAMUSCULAR | Status: AC
  Administered 2021-01-23: 40000 [IU] via SUBCUTANEOUS
  Filled 2021-01-23: qty 1

## 2021-01-23 MED ORDER — EPOETIN ALFA-EPBX 10000 UNIT/ML IJ SOLN
INTRAMUSCULAR | Status: AC
  Administered 2021-01-23: 20000 [IU]
  Filled 2021-01-23: qty 2

## 2021-01-23 NOTE — Progress Notes (Addendum)
Hgb via hemocue 7.5, pt denies chest pain, denies SOB, reports having a boil rupture about a week ago with bloody drainage still noted  Dr. Posey Pronto notified change visits to weekly, give retacrit 60,000 units sq

## 2021-01-24 ENCOUNTER — Encounter (HOSPITAL_COMMUNITY): Payer: Self-pay | Admitting: Emergency Medicine

## 2021-01-24 ENCOUNTER — Observation Stay (HOSPITAL_COMMUNITY)
Admission: EM | Admit: 2021-01-24 | Discharge: 2021-01-31 | Disposition: A | Discharge status: Home / Self Care | Payer: Medicare Other | Attending: Internal Medicine | Admitting: Internal Medicine

## 2021-01-24 DIAGNOSIS — D62 Acute posthemorrhagic anemia: Secondary | ICD-10-CM

## 2021-01-24 DIAGNOSIS — L03317 Cellulitis of buttock: Secondary | ICD-10-CM

## 2021-01-24 DIAGNOSIS — N184 Chronic kidney disease, stage 4 (severe): Secondary | ICD-10-CM | POA: Insufficient documentation

## 2021-01-24 DIAGNOSIS — I5022 Chronic systolic (congestive) heart failure: Secondary | ICD-10-CM | POA: Diagnosis not present

## 2021-01-24 DIAGNOSIS — D509 Iron deficiency anemia, unspecified: Secondary | ICD-10-CM | POA: Diagnosis not present

## 2021-01-24 DIAGNOSIS — R195 Other fecal abnormalities: Secondary | ICD-10-CM | POA: Diagnosis not present

## 2021-01-24 DIAGNOSIS — E6609 Other obesity due to excess calories: Secondary | ICD-10-CM | POA: Diagnosis present

## 2021-01-24 DIAGNOSIS — Z794 Long term (current) use of insulin: Secondary | ICD-10-CM | POA: Diagnosis not present

## 2021-01-24 DIAGNOSIS — Z96641 Presence of right artificial hip joint: Secondary | ICD-10-CM | POA: Diagnosis not present

## 2021-01-24 DIAGNOSIS — E7800 Pure hypercholesterolemia, unspecified: Secondary | ICD-10-CM

## 2021-01-24 DIAGNOSIS — J449 Chronic obstructive pulmonary disease, unspecified: Secondary | ICD-10-CM | POA: Insufficient documentation

## 2021-01-24 DIAGNOSIS — E785 Hyperlipidemia, unspecified: Secondary | ICD-10-CM | POA: Diagnosis present

## 2021-01-24 DIAGNOSIS — I70229 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, unspecified extremity: Secondary | ICD-10-CM | POA: Diagnosis not present

## 2021-01-24 DIAGNOSIS — Z20822 Contact with and (suspected) exposure to covid-19: Secondary | ICD-10-CM | POA: Insufficient documentation

## 2021-01-24 DIAGNOSIS — F17210 Nicotine dependence, cigarettes, uncomplicated: Secondary | ICD-10-CM | POA: Diagnosis not present

## 2021-01-24 DIAGNOSIS — L0231 Cutaneous abscess of buttock: Secondary | ICD-10-CM | POA: Diagnosis not present

## 2021-01-24 DIAGNOSIS — E119 Type 2 diabetes mellitus without complications: Secondary | ICD-10-CM | POA: Diagnosis not present

## 2021-01-24 DIAGNOSIS — I5043 Acute on chronic combined systolic (congestive) and diastolic (congestive) heart failure: Secondary | ICD-10-CM

## 2021-01-24 DIAGNOSIS — I739 Peripheral vascular disease, unspecified: Secondary | ICD-10-CM | POA: Diagnosis present

## 2021-01-24 DIAGNOSIS — E1129 Type 2 diabetes mellitus with other diabetic kidney complication: Secondary | ICD-10-CM | POA: Diagnosis present

## 2021-01-24 DIAGNOSIS — D649 Anemia, unspecified: Secondary | ICD-10-CM | POA: Diagnosis not present

## 2021-01-24 DIAGNOSIS — K449 Diaphragmatic hernia without obstruction or gangrene: Secondary | ICD-10-CM | POA: Diagnosis not present

## 2021-01-24 DIAGNOSIS — E039 Hypothyroidism, unspecified: Secondary | ICD-10-CM | POA: Insufficient documentation

## 2021-01-24 DIAGNOSIS — E876 Hypokalemia: Secondary | ICD-10-CM | POA: Diagnosis not present

## 2021-01-24 DIAGNOSIS — I130 Hypertensive heart and chronic kidney disease with heart failure and stage 1 through stage 4 chronic kidney disease, or unspecified chronic kidney disease: Secondary | ICD-10-CM | POA: Diagnosis not present

## 2021-01-24 DIAGNOSIS — Z96649 Presence of unspecified artificial hip joint: Secondary | ICD-10-CM | POA: Insufficient documentation

## 2021-01-24 DIAGNOSIS — I509 Heart failure, unspecified: Secondary | ICD-10-CM

## 2021-01-24 DIAGNOSIS — E1122 Type 2 diabetes mellitus with diabetic chronic kidney disease: Secondary | ICD-10-CM

## 2021-01-24 DIAGNOSIS — Z6836 Body mass index (BMI) 36.0-36.9, adult: Secondary | ICD-10-CM | POA: Diagnosis present

## 2021-01-24 DIAGNOSIS — J45909 Unspecified asthma, uncomplicated: Secondary | ICD-10-CM | POA: Insufficient documentation

## 2021-01-24 DIAGNOSIS — E871 Hypo-osmolality and hyponatremia: Secondary | ICD-10-CM | POA: Diagnosis present

## 2021-01-24 DIAGNOSIS — N185 Chronic kidney disease, stage 5: Secondary | ICD-10-CM

## 2021-01-24 DIAGNOSIS — I10 Essential (primary) hypertension: Secondary | ICD-10-CM

## 2021-01-24 DIAGNOSIS — I5042 Chronic combined systolic (congestive) and diastolic (congestive) heart failure: Secondary | ICD-10-CM

## 2021-01-24 LAB — CBC
HCT: 25.5 % — ABNORMAL LOW (ref 36.0–46.0)
Hemoglobin: 7.7 g/dL — ABNORMAL LOW (ref 12.0–15.0)
MCH: 24.1 pg — ABNORMAL LOW (ref 26.0–34.0)
MCHC: 30.2 g/dL (ref 30.0–36.0)
MCV: 79.9 fL — ABNORMAL LOW (ref 80.0–100.0)
Platelets: 395 10*3/uL (ref 150–400)
RBC: 3.19 MIL/uL — ABNORMAL LOW (ref 3.87–5.11)
RDW: 18.4 % — ABNORMAL HIGH (ref 11.5–15.5)
WBC: 9.5 10*3/uL (ref 4.0–10.5)
nRBC: 0.2 % (ref 0.0–0.2)

## 2021-01-24 LAB — RESP PANEL BY RT-PCR (FLU A&B, COVID) ARPGX2
Influenza A by PCR: NEGATIVE
Influenza B by PCR: NEGATIVE
SARS Coronavirus 2 by RT PCR: NEGATIVE

## 2021-01-24 LAB — COMPREHENSIVE METABOLIC PANEL
ALT: 18 U/L (ref 0–44)
AST: 21 U/L (ref 15–41)
Albumin: 3.2 g/dL — ABNORMAL LOW (ref 3.5–5.0)
Alkaline Phosphatase: 61 U/L (ref 38–126)
Anion gap: 14 (ref 5–15)
BUN: 168 mg/dL — ABNORMAL HIGH (ref 8–23)
CO2: 29 mmol/L (ref 22–32)
Calcium: 9.9 mg/dL (ref 8.9–10.3)
Chloride: 92 mmol/L — ABNORMAL LOW (ref 98–111)
Creatinine, Ser: 3.03 mg/dL — ABNORMAL HIGH (ref 0.44–1.00)
GFR, Estimated: 16 mL/min — ABNORMAL LOW (ref >60)
Glucose, Bld: 148 mg/dL — ABNORMAL HIGH (ref 70–99)
Potassium: 2.9 mmol/L — ABNORMAL LOW (ref 3.5–5.1)
Sodium: 135 mmol/L (ref 135–145)
Total Bilirubin: 0.6 mg/dL (ref 0.3–1.2)
Total Protein: 7.3 g/dL (ref 6.5–8.1)

## 2021-01-24 LAB — PREPARE RBC (CROSSMATCH)

## 2021-01-24 LAB — GLUCOSE, CAPILLARY: Glucose-Capillary: 234 mg/dL — ABNORMAL HIGH (ref 70–99)

## 2021-01-24 LAB — POC OCCULT BLOOD, ED: Fecal Occult Bld: POSITIVE — AB

## 2021-01-24 MED ORDER — INSULIN ASPART 100 UNIT/ML ~~LOC~~ SOLN
0.0000 [IU] | Freq: Every day | SUBCUTANEOUS | Status: DC
  Administered 2021-01-24 – 2021-01-29 (×3): 2 [IU] via SUBCUTANEOUS

## 2021-01-24 MED ORDER — PANTOPRAZOLE SODIUM 40 MG IV SOLR
40.0000 mg | INTRAVENOUS | Status: DC
  Administered 2021-01-24 – 2021-01-26 (×3): 40 mg via INTRAVENOUS
  Filled 2021-01-24 (×3): qty 40

## 2021-01-24 MED ORDER — CINACALCET HCL 30 MG PO TABS
30.0000 mg | ORAL_TABLET | Freq: Every day | ORAL | Status: DC
  Administered 2021-01-24 – 2021-01-30 (×7): 30 mg via ORAL
  Filled 2021-01-24 (×9): qty 1

## 2021-01-24 MED ORDER — OXYCODONE-ACETAMINOPHEN 10-325 MG PO TABS: 1.0000 | ORAL_TABLET | Freq: Four times a day (QID) | ORAL | Status: DC | PRN

## 2021-01-24 MED ORDER — ALBUTEROL SULFATE (2.5 MG/3ML) 0.083% IN NEBU: 3.0000 mL | INHALATION_SOLUTION | Freq: Four times a day (QID) | RESPIRATORY_TRACT | Status: DC | PRN

## 2021-01-24 MED ORDER — CARVEDILOL 12.5 MG PO TABS
12.5000 mg | ORAL_TABLET | Freq: Two times a day (BID) | ORAL | Status: DC
  Administered 2021-01-24 – 2021-01-31 (×15): 12.5 mg via ORAL
  Filled 2021-01-24 (×15): qty 1

## 2021-01-24 MED ORDER — TORSEMIDE 100 MG PO TABS
100.0000 mg | ORAL_TABLET | Freq: Two times a day (BID) | ORAL | Status: DC
  Administered 2021-01-24 – 2021-01-31 (×14): 100 mg via ORAL
  Filled 2021-01-24 (×15): qty 1

## 2021-01-24 MED ORDER — INSULIN ASPART 100 UNIT/ML ~~LOC~~ SOLN
0.0000 [IU] | Freq: Three times a day (TID) | SUBCUTANEOUS | Status: DC
  Administered 2021-01-25: 3 [IU] via SUBCUTANEOUS
  Administered 2021-01-25: 4 [IU] via SUBCUTANEOUS
  Administered 2021-01-25 – 2021-01-26 (×2): 3 [IU] via SUBCUTANEOUS
  Administered 2021-01-26 (×2): 7 [IU] via SUBCUTANEOUS
  Administered 2021-01-27: 4 [IU] via SUBCUTANEOUS
  Administered 2021-01-27: 7 [IU] via SUBCUTANEOUS
  Administered 2021-01-27: 3 [IU] via SUBCUTANEOUS
  Administered 2021-01-28 (×2): 4 [IU] via SUBCUTANEOUS
  Administered 2021-01-28: 7 [IU] via SUBCUTANEOUS
  Administered 2021-01-29: 3 [IU] via SUBCUTANEOUS
  Administered 2021-01-29: 7 [IU] via SUBCUTANEOUS
  Administered 2021-01-29 – 2021-01-30 (×2): 3 [IU] via SUBCUTANEOUS
  Administered 2021-01-30: 11 [IU] via SUBCUTANEOUS
  Administered 2021-01-30: 3 [IU] via SUBCUTANEOUS
  Administered 2021-01-31 (×2): 4 [IU] via SUBCUTANEOUS
  Administered 2021-01-31: 3 [IU] via SUBCUTANEOUS

## 2021-01-24 MED ORDER — HYDROXYZINE HCL 25 MG PO TABS
25.0000 mg | ORAL_TABLET | Freq: Two times a day (BID) | ORAL | Status: DC | PRN
  Administered 2021-01-25: 25 mg via ORAL
  Filled 2021-01-24: qty 1

## 2021-01-24 MED ORDER — ONDANSETRON HCL 4 MG PO TABS
4.0000 mg | ORAL_TABLET | Freq: Four times a day (QID) | ORAL | Status: DC | PRN
  Administered 2021-01-27: 4 mg via ORAL
  Filled 2021-01-24: qty 1

## 2021-01-24 MED ORDER — METOLAZONE 5 MG PO TABS
5.0000 mg | ORAL_TABLET | ORAL | Status: DC
  Administered 2021-01-25 – 2021-01-30 (×3): 5 mg via ORAL
  Filled 2021-01-24 (×3): qty 1

## 2021-01-24 MED ORDER — ALLOPURINOL 100 MG PO TABS
100.0000 mg | ORAL_TABLET | Freq: Every day | ORAL | Status: DC
  Administered 2021-01-25 – 2021-01-31 (×7): 100 mg via ORAL
  Filled 2021-01-24 (×7): qty 1

## 2021-01-24 MED ORDER — OXYCODONE HCL 5 MG PO TABS
5.0000 mg | ORAL_TABLET | Freq: Four times a day (QID) | ORAL | Status: DC | PRN
  Administered 2021-01-25 – 2021-01-30 (×9): 5 mg via ORAL
  Filled 2021-01-24 (×10): qty 1

## 2021-01-24 MED ORDER — FLUTICASONE PROPIONATE 50 MCG/ACT NA SUSP: 2.0000 | Freq: Every day | NASAL | Status: DC | PRN

## 2021-01-24 MED ORDER — DOXYCYCLINE HYCLATE 100 MG PO TABS
100.0000 mg | ORAL_TABLET | Freq: Two times a day (BID) | ORAL | Status: DC
  Administered 2021-01-24 – 2021-01-30 (×12): 100 mg via ORAL
  Filled 2021-01-24 (×12): qty 1

## 2021-01-24 MED ORDER — MOMETASONE FURO-FORMOTEROL FUM 200-5 MCG/ACT IN AERO
2.0000 | INHALATION_SPRAY | Freq: Two times a day (BID) | RESPIRATORY_TRACT | Status: DC
  Administered 2021-01-25 – 2021-01-31 (×12): 2 via RESPIRATORY_TRACT
  Filled 2021-01-24: qty 8.8

## 2021-01-24 MED ORDER — MIRABEGRON ER 25 MG PO TB24
50.0000 mg | ORAL_TABLET | Freq: Every day | ORAL | Status: DC
  Administered 2021-01-24 – 2021-01-31 (×8): 50 mg via ORAL
  Filled 2021-01-24 (×8): qty 2

## 2021-01-24 MED ORDER — SODIUM CHLORIDE 0.9% FLUSH
3.0000 mL | Freq: Two times a day (BID) | INTRAVENOUS | Status: DC
  Administered 2021-01-25 – 2021-01-31 (×12): 3 mL via INTRAVENOUS

## 2021-01-24 MED ORDER — SODIUM CHLORIDE 0.9% IV SOLUTION: Freq: Once | INTRAVENOUS | Status: AC

## 2021-01-24 MED ORDER — POTASSIUM CHLORIDE CRYS ER 20 MEQ PO TBCR
40.0000 meq | EXTENDED_RELEASE_TABLET | Freq: Once | ORAL | Status: AC
  Administered 2021-01-24: 40 meq via ORAL
  Filled 2021-01-24: qty 2

## 2021-01-24 MED ORDER — ONDANSETRON HCL 4 MG/2ML IJ SOLN
4.0000 mg | Freq: Four times a day (QID) | INTRAMUSCULAR | Status: DC | PRN
  Administered 2021-01-30: 4 mg via INTRAVENOUS
  Filled 2021-01-24: qty 2

## 2021-01-24 MED ORDER — POLYVINYL ALCOHOL 1.4 % OP SOLN: 2.0000 [drp] | Freq: Every day | OPHTHALMIC | Status: DC | PRN

## 2021-01-24 MED ORDER — OXYCODONE-ACETAMINOPHEN 5-325 MG PO TABS
1.0000 | ORAL_TABLET | Freq: Four times a day (QID) | ORAL | Status: DC | PRN
  Administered 2021-01-25 – 2021-01-31 (×16): 1 via ORAL
  Filled 2021-01-24 (×16): qty 1

## 2021-01-24 MED ORDER — LEVOTHYROXINE SODIUM 50 MCG PO TABS
150.0000 ug | ORAL_TABLET | Freq: Every day | ORAL | Status: DC
  Administered 2021-01-25 – 2021-01-31 (×7): 150 ug via ORAL
  Filled 2021-01-24 (×7): qty 1

## 2021-01-24 NOTE — ED Notes (Signed)
Patient is too weak to do orthostatic vitals at this time

## 2021-01-24 NOTE — ED Triage Notes (Signed)
Patient here from home reporting that she was sent by [facility] with complaints of positive hemoccult with hgb of 7.7. Frequent episodes of abd pain. Hx of anemia and renal disease.

## 2021-01-24 NOTE — ED Notes (Signed)
Patient attached to external female catheter 

## 2021-01-24 NOTE — H&P (Signed)
History and Physical   [name] [name] KXF:[id number] DOB: [birth year] DOA: 01/24/2021  Referring MD/NP/PA: Dr. Kathrynn Humble, EDP PCP: [name], MD Outpatient Specialists: Nephrology, Dr. [name]; [facility], Hematology Dr. Irene Limbo.   Patient coming from: Home  Chief Complaint: Sent by GI office for worsening anemia and +FOBT  HPI: [name] [name] is a 70 y.o. female with a history of stage IV CKD with AVF not on HD, CHF, COPD, GERD, IDT2DM, HTN, and chrnoic anemia of iron deficiency and of CKD on IV iron infusions and EPO injections regularly with history of transfusion required who was sent to the ED from [facility] GI office due to GI bleeding and decreased hemoglobin to 7.5g/dl.   She's felt much more fatigued lately and was seen in the ED a couple weeks ago after syncopal episode declining admission at that time. Also has had pagophagia and always feels cold. These symptoms have progressed over past few weeks. She denies red blood in stools though they are dark, struggles with constipation. Has a boil on the left buttock which recently ruptured and has mild pain associated with that. She saw Vicie Mutters, PA this morning and reported symptoms, noted hgb down, and "advised patient to go to ER for possible transfusion and inpatient colonoscopy and endoscopy to evaluate with patient's multiple comorbidities."   ED Course: Afebrile, hypertensive, in no distress. +FOBT without gross bleeding. Hgb 7.7g/dl. Metabolic panel shows hypokalemia and derangements near the patient's baseline including BUN 168, Cr 3.03. Admission requested, GI consulted.   Review of Systems: No fevers, cough, chest pain, palpitations, current abdominal pain. Leg swelling is stable, no orthopnea, and per HPI. All others reviewed and are negative.   Past Medical History:  Diagnosis Date  . Anemia   . Arthritis   . Asthma   . Bronchitis   . CHF (congestive heart failure) ([place])   . Chronic kidney disease   . Chronic kidney  disease   . Chronic pain syndrome   . COPD (chronic obstructive pulmonary disease) ([place])   . Cramping of feet   . Diabetes mellitus   . Diabetes mellitus without complication ([place])   . Dyspnea   . Full dentures   . GERD (gastroesophageal reflux disease)   . Gout   . Headache   . History of hiatal hernia   . History of hip replacement, total   . History of transfusion   . Hx MRSA infection    abscess left groin  . Hx of cardiac cath 06/2014  . Hyperlipidemia   . Hypertension   . Hypothyroid   . 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 Morbidly obese ([place])   . Peripheral vascular disease ([place])   . Positive cardiac stress test 12/2013   anterior and lateral ischemia on Myoview  . Sleep apnea    UNABLE TO TOLERATE C PAP  . Stress incontinence   . Thyroid disease   . TIA (transient ischemic attack)     X2 NO RESIDUAL PROBLEMS  . Wears glasses    Past Surgical History:  Procedure Laterality Date  . ABDOMINAL HYSTERECTOMY    . BACK SURGERY  2020  . BASCILIC VEIN TRANSPOSITION Left 04/05/2020   Procedure: BASILIC VEIN TRANSPOSITION FIRST STAGE;  Surgeon: Angelia Mould, MD;  Location: [place];  Service: Vascular;  Laterality: Left;  . COLON SURGERY  1995   DUE TO POLYP  . DILATION AND CURETTAGE OF UTERUS    . FOREARM FRACTURE SURGERY  Left arm  . HERNIA REPAIR     w/ mesh  . INCISION AND DRAINAGE ABSCESS Left 02/04/2013   Procedure: INCISION AND DRAINAGE LEFT BUTTOCK ABSCESS; INCISION AND DRAINAGE LEFT BREAST ABSCESS;  Surgeon: Harl Bowie, MD;  Location: [place];  Service: General;  Laterality: Left;  . INCISION AND DRAINAGE ABSCESS N/A 02/12/2013   Procedure: INCISION AND DEBRIDEMENT BUTTOCK WOUND ;  Surgeon: [name]. Georgette [place], MD;  Location: [place];  Service: General;  Laterality: N/A;  . INCISION AND DRAINAGE ABSCESS N/A 02/14/2013   Procedure: INCISION AND DRAINAGE/DRESSING CHANGE;  Surgeon: Harl Bowie, MD;  Location: [place];  Service: General;  Laterality:  N/A;  . INCISION AND DRAINAGE ABSCESS N/A 03/21/2015   Procedure: INCISION AND DRAINAGE PUBIC ABSCESS;  Surgeon: Excell Seltzer, MD;  Location: [facility];  Service: General;  Laterality: N/A;  . INCISION AND DRAINAGE PERIRECTAL ABSCESS Left 02/10/2013   Procedure: IRRIGATION AND DEBRIDEMENT OF BUTTOCK/PERINEAL ABSCESS;  Surgeon: [name]. Georgette [place], MD;  Location: [place];  Service: General;  Laterality: Left;  . INCISION AND DRAINAGE PERIRECTAL ABSCESS N/A 02/16/2013   Procedure: IRRIGATION AND DEBRIDEMENT PERINEAL ABSCESS;  Surgeon: Zenovia Jarred, MD;  Location: [place];  Service: General;  Laterality: N/A;  . IRRIGATION AND DEBRIDEMENT ABSCESS N/A 02/06/2013   Procedure: IRRIGATION AND DEBRIDEMENT BUTTOCK ABSCESS AND DRESSING CHANGE;  Surgeon: Harl Bowie, MD;  Location: [place];  Service: General;  Laterality: N/A;  . IRRIGATION AND DEBRIDEMENT ABSCESS Left 02/08/2013   Procedure: IRRIGATION AND DEBRIDEMENT ABSCESS/DRESSING CHANGE;  Surgeon: Gwenyth Ober, MD;  Location: [place];  Service: General;  Laterality: Left;  . JOINT REPLACEMENT  2010 / 2012   . LAPAROSCOPIC CHOLECYSTECTOMY    . LEFT HEART CATHETERIZATION WITH CORONARY ANGIOGRAM N/A 07/02/2014   Procedure: LEFT HEART CATHETERIZATION WITH CORONARY ANGIOGRAM;  Surgeon: Lorretta Harp, MD;  Location: [facility] CATH LAB;  Service: Cardiovascular;  Laterality: N/A;  . LOWER EXTREMITY ANGIOGRAM Right 04/20/2016   Procedure: Lower Extremity Angiogram;  Surgeon: Elam Dutch, MD;  Location: [place] CV LAB;  Service: Cardiovascular;  Laterality: Right;  . MULTIPLE TOOTH EXTRACTIONS    . PERIPHERAL VASCULAR CATHETERIZATION N/A 04/20/2016   Procedure: Abdominal Aortogram;  Surgeon: Elam Dutch, MD;  Location: [place] CV LAB;  Service: Cardiovascular;  Laterality: N/A;  . PERIPHERAL VASCULAR CATHETERIZATION Right 04/20/2016   Procedure: Peripheral Vascular Intervention;  Surgeon: Elam Dutch, MD;  Location: [place] CV LAB;  Service:  Cardiovascular;  Laterality: Right;  popiteal  . RIGHT HEART CATH N/A 10/27/2018   Procedure: RIGHT HEART CATH;  Surgeon: Larey Dresser, MD;  Location: [place] CV LAB;  Service: Cardiovascular;  Laterality: N/A;  . RIGHT HEART CATH N/A 03/20/2019   Procedure: RIGHT HEART CATH;  Surgeon: Larey Dresser, MD;  Location: [place] CV LAB;  Service: Cardiovascular;  Laterality: N/A;  . THYROIDECTOMY    . TOTAL HIP REVISION Right 08/11/2014   Procedure: RIGHT ACETABULAR REVISION;  Surgeon: Gearlean Alf, MD;  Location: [facility];  Service: Orthopedics;  Laterality: Right;  [facility] VASCULAR SURGERY     - +smoking history. Rarely drinks alcohol.   reports that she has been smoking cigarettes. She has a 10.00 pack-year smoking history. She has never used smokeless tobacco. She reports current alcohol use of about 1.0 standard drink of alcohol per week. She reports that she does not use drugs. Allergies  Allergen Reactions  . Nebivolol Swelling    Chest  pain (reaction to Bystolic)  . Ace Inhibitors Swelling and Other (See Comments)    Tongue swell  . Ace Inhibitors Swelling  . Morphine And Related Itching  . Morphine And Related Itching   Family History  Problem Relation Age of Onset  . Diabetes Brother   . Cardiomyopathy Mother   . Heart disease Mother   . Cancer Father    - Family history otherwise reviewed and not pertinent.  Prior to Admission medications   Medication Sig Start Date End Date Taking? Authorizing Provider  albuterol (PROVENTIL) (2.5 MG/3ML) 0.083% nebulizer solution Take 2.5 mg by nebulization every 6 (six) hours as needed for wheezing or shortness of breath.   Yes [provider]  albuterol (VENTOLIN HFA) 108 (90 Base) MCG/ACT inhaler Inhale 2 puffs into the lungs 4 (four) times daily as needed for wheezing or shortness of breath.   Yes [provider]  allopurinol (ZYLOPRIM) 100 MG tablet Take 100 mg by mouth daily. 09/14/20  Yes [provider]   B Complex Vitamins (VITAMIN B COMPLEX PO) Take 1 capsule by mouth daily.   Yes [provider]  budesonide-formoterol (SYMBICORT) 160-4.5 MCG/ACT inhaler Inhale 2 puffs into the lungs 2 (two) times daily as needed (for SOB).    Yes [provider]  carboxymethylcellulose (REFRESH PLUS) 0.5 % SOLN Place 1 drop into both eyes daily as needed (dry eyes).   Yes [provider]  carvedilol (COREG) 12.5 MG tablet Take 12.5 mg by mouth 2 (two) times daily. 01/01/21  Yes [provider]  cholecalciferol (VITAMIN D3) 25 MCG (1000 UNIT) tablet Take 1,000 Units by mouth daily.   Yes [provider]  cinacalcet (SENSIPAR) 30 MG tablet Take 30 mg by mouth daily. 12/08/20  Yes [provider]  clopidogrel (PLAVIX) 75 MG tablet TAKE 1 TABLET BY MOUTH EVERY DAY Patient taking differently: Take 75 mg by mouth daily. 06/18/17  Yes Waynetta Sandy, MD  clotrimazole-betamethasone (LOTRISONE) cream Apply 1 application topically 2 (two) times daily as needed (rash/itching). 09/14/20  Yes [provider]  diclofenac Sodium (VOLTAREN) 1 % GEL Apply 1 application topically 4 (four) times daily as needed (pain). 08/15/20  Yes [provider]  diphenhydrAMINE (BENADRYL) 25 MG tablet Take 25 mg by mouth every 6 (six) hours as needed for allergies.   Yes [provider]  Dulaglutide (TRULICITY) 1.5 IW/9.7LG SOPN Inject 1.5 mg into the skin every Monday.   Yes [provider]  fluticasone (FLONASE) 50 MCG/ACT nasal spray Place 2 sprays into the nose as needed for allergies.    Yes [provider]  hydrOXYzine (ATARAX/VISTARIL) 25 MG tablet Take 25 mg by mouth 2 (two) times daily as needed for anxiety. 12/08/20  Yes [provider]  insulin lispro protamine-lispro (HUMALOG 75/25 MIX) (75-25) 100 UNIT/ML SUSP injection Inject 30-40 Units into the skin See admin instructions. Inject 40 units into the skin morning and 30  units in the evening   Yes [provider]  levothyroxine (SYNTHROID) 150 MCG tablet Take 150 mcg by mouth daily. 12/10/20  Yes [provider]  magnesium oxide (MAG-OX) 400 (241.3 Mg) MG tablet Take 1 tablet (400 mg total) by mouth 2 (two) times daily. 01/14/20  Yes Lavina Hamman, MD  metolazone (ZAROXOLYN) 5 MG tablet Take 5 mg by mouth See admin instructions. Takes 1 tablet on Monday, Wednesday and Friday 12/06/20  Yes [provider]  Multiple Vitamin (MULTIVITAMIN WITH MINERALS) TABS tablet Take 1 tablet  by mouth daily.   Yes [provider]  MYRBETRIQ 50 MG TB24 tablet Take 50 mg by mouth daily. 01/03/21  Yes [provider]  oxyCODONE-acetaminophen (PERCOCET) 10-325 MG tablet Take 1 tablet by mouth every 6 (six) hours as needed for pain. Patient taking differently: Take 1 tablet by mouth every 6 (six) hours. 04/05/20  Yes Dagoberto Ligas, PA-C  pantoprazole (PROTONIX) 40 MG tablet Take 40 mg by mouth daily. 11/24/20  Yes [provider]  polyvinyl alcohol (LIQUIFILM TEARS) 1.4 % ophthalmic solution Place 2 drops into both eyes daily as needed for dry eyes.   Yes [provider]  Potassium Chloride ER 20 MEQ TBCR Take 1 tablet by mouth 3 (three) times a week. Tuesday, Thursdays and Saturdays 11/24/20  Yes [provider]  torsemide (DEMADEX) 100 MG tablet Take 100 mg by mouth 2 (two) times daily. 12/05/20  Yes [provider]  TRULICITY 1.5 WP/8.0DX SOPN Inject 1.5 mg into the skin once a week. Monday 11/11/20  Yes [provider]  metoCLOPramide (REGLAN) 5 MG tablet Take 5 mg by mouth 3 (three) times daily. 01/20/21   [provider]    Physical Exam: Vitals:   01/24/21 1444 01/24/21 1445 01/24/21 1500 01/24/21 1600  BP: (!) 148/62 (!) 144/69 (!) 158/79 134/67  Pulse: 77 82 82 75  Resp: 14 14 15 16   Temp:    98.1 F (36.7 C)  TempSrc:    Oral  SpO2: 98% 97% 98% 96%  Weight:      Height:        Constitutional: Chronically ill-appearing female in no distress, calm demeanor Eyes: Lids and conjunctivae normal, PERRL ENMT: Mucous membranes are moist. Posterior pharynx clear of any exudate or lesions. Dentures.  Neck: normal, supple, no masses, no thyromegaly Respiratory: Non-labored breathing room air without accessory muscle use. Clear breath sounds to auscultation bilaterally Cardiovascular: Regular rate and rhythm. No friction rub, no murmur. No definite JVD. trace pitting LE edema. Palpable decreased pedal pulses. Abdomen: Normoactive bowel sounds. Obese without tenderness, non-distended, and no masses palpated. No hepatosplenomegaly. No gross bleeding on rectal exam.  GU: No indwelling catheter Musculoskeletal: No clubbing / cyanosis. No joint deformity upper and lower extremities. Good ROM, no contractures. Normal muscle tone.  Skin: Warm, dry. Left medial buttock with small punctum draining serous fluid with adjoining ovoid induration that is tender. No fluctuance.  Neurologic: CN II-XII grossly intact. Speech normal. No focal deficits in motor strength or sensation in all extremities.  Psychiatric: Alert and oriented x3. Normal judgment and insight. Mood euthymic with congruent affect.   Labs on Admission: I have personally reviewed following labs and imaging studies  CBC: Recent Labs  Lab 01/23/21 1049 01/24/21 1426  WBC  --  9.5  HGB 7.5* 7.7*  HCT  --  25.5*  MCV  --  79.9*  PLT  --  833   Basic Metabolic Panel: Recent Labs  Lab 01/24/21 1426  NA 135  K 2.9*  CL 92*  CO2 29  GLUCOSE 148*  BUN 168*  CREATININE 3.03*  CALCIUM 9.9   GFR: Estimated Creatinine Clearance: 18.1 mL/min (A) (by C-G formula based on SCr of 3.03 mg/dL (H)). Liver Function Tests: Recent Labs  Lab 01/24/21 1426  AST 21  ALT 18  ALKPHOS 61  BILITOT 0.6  PROT 7.3  ALBUMIN 3.2*   No results for input(s): LIPASE, AMYLASE in the last 168 hours. No results for input(s): AMMONIA  in the last  168 hours. Coagulation Profile: No results for input(s): INR, PROTIME in the last 168 hours. Cardiac Enzymes: No results for input(s): CKTOTAL, CKMB, CKMBINDEX, TROPONINI in the last 168 hours. BNP (last 3 results) No results for input(s): PROBNP in the last 8760 hours. HbA1C: No results for input(s): HGBA1C in the last 72 hours. CBG: No results for input(s): GLUCAP in the last 168 hours. Lipid Profile: No results for input(s): CHOL, HDL, LDLCALC, TRIG, CHOLHDL, LDLDIRECT in the last 72 hours. Thyroid Function Tests: No results for input(s): TSH, T4TOTAL, FREET4, T3FREE, THYROIDAB in the last 72 hours. Anemia Panel: Recent Labs    01/23/21 1042  FERRITIN 14  TIBC 416  IRON 18*   Urine analysis:    Component Value Date/Time   COLORURINE YELLOW 01/05/2021 1811   APPEARANCEUR HAZY (A) 01/05/2021 1811   LABSPEC 1.010 01/05/2021 1811   PHURINE 5.0 01/05/2021 1811   GLUCOSEU NEGATIVE 01/05/2021 1811   HGBUR NEGATIVE 01/05/2021 1811   BILIRUBINUR NEGATIVE 01/05/2021 1811   KETONESUR NEGATIVE 01/05/2021 1811   PROTEINUR NEGATIVE 01/05/2021 1811   UROBILINOGEN 0.2 08/04/2014 1409   NITRITE NEGATIVE 01/05/2021 1811   LEUKOCYTESUR NEGATIVE 01/05/2021 1811    Recent Results (from the past 240 hour(s))  Resp Panel by RT-PCR (Flu A&B, Covid) Nasopharyngeal Swab     Status: None   Collection Time: 01/24/21  2:27 PM   Specimen: Nasopharyngeal Swab; Nasopharyngeal(NP) swabs in vial transport medium  Result Value Ref Range Status   SARS Coronavirus 2 by RT PCR NEGATIVE NEGATIVE Final    Comment: (NOTE) SARS-CoV-2 target nucleic acids are NOT DETECTED.  The SARS-CoV-2 RNA is generally detectable in upper respiratory specimens during the acute phase of infection. The lowest concentration of SARS-CoV-2 viral copies this assay can detect is 138 copies/mL. A negative result does not preclude SARS-Cov-2 infection and should not be used as the sole basis for treatment  or other patient management decisions. A negative result may occur with  improper specimen collection/handling, submission of specimen other than nasopharyngeal swab, presence of viral mutation(s) within the areas targeted by this assay, and inadequate number of viral copies(<138 copies/mL). A negative result must be combined with clinical observations, patient history, and epidemiological information. The expected result is Negative.  Fact Sheet for Patients:  EntrepreneurPulse.com.au  Fact Sheet for Healthcare Providers:  IncredibleEmployment.be  This test is no t yet approved or cleared by the [place] FDA and  has been authorized for detection and/or diagnosis of SARS-CoV-2 by FDA under an Emergency Use Authorization (EUA). This EUA will remain  in effect (meaning this test can be used) for the duration of the COVID-19 declaration under Section 564(b)(1) of the Act, 21 U.S.C.section 360bbb-3(b)(1), unless the authorization is terminated  or revoked sooner.       Influenza A by PCR NEGATIVE NEGATIVE Final   Influenza B by PCR NEGATIVE NEGATIVE Final    Comment: (NOTE) The Xpert Xpress SARS-CoV-2/FLU/RSV plus assay is intended as an aid in the diagnosis of influenza from Nasopharyngeal swab specimens and should not be used as a sole basis for treatment. Nasal washings and aspirates are unacceptable for Xpert Xpress SARS-CoV-2/FLU/RSV testing.  Fact Sheet for Patients: EntrepreneurPulse.com.au  Fact Sheet for Healthcare Providers: IncredibleEmployment.be  This test is not yet approved or cleared by the [place] FDA and has been authorized for detection and/or diagnosis of SARS-CoV-2 by FDA under an Emergency Use Authorization (EUA). This EUA will remain in effect (meaning this test can be used) for the  duration of the COVID-19 declaration under Section 564(b)(1) of the Act, 21 U.S.C. section  360bbb-3(b)(1), unless the authorization is terminated or revoked.  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 +FOBT, so sent from GI office for scope and transfusion. Note BUN is elevated and has been chronically rising, though this is commensurate with rise in creatinine. Last colonoscopy Oct 2018 diverticulosis, internal hemorrhoids.  - Transfuse 1u PRBCs now - Got EPO last 4/18, due for IV iron on schedule 4/25 per pt.  - GI consulted, spoke with Caralee Ates, PA. Will make NPO p MN for EGD 4/20.  - Empiric PPI - Hold plavix (last dose 4/18)  CKD 5: Followed by Dr. [name].  - No urgent indications for dialysis at this time.  - Continue home medications  Chronic combined HFrEF: LVEF 40-45%, impaired LV relaxation in 2020. Followed by Dr. Aundra Dubin. Appears at her baseline volume status.  - No change to home medications including torsemide, metolazone MWF, coreg.   IDT2DM: Recent HbA1c 6.5%. - Hold 75/25 insulin. Pt didn't take it today and CBG is only 148. Will give HS correction and plan on SSI. If CBGs rise, will need basal.   Boil with mild cellulitis/induration: Boil draining without fluctuance though induration area is fairly  significant.  - Cover with doxycycline  Hypokalemia:  - Supplement with 80mEq instead of home 20 mEq and monitor.   PVD:  - Hold plavix for now  COPD: Stable - Continue home medications  Chronic pain:  - Continue home medication  Hypothyroidism: Recent TSH 1.518 - Continue synthroid.   DVT prophylaxis: SCDs  Code Status: Full  Family Communication: None at bedside Disposition Plan: Return home pending further work up Consults called: [facility]  Admission status: Observation    Patrecia Pour, MD [facility] [email or web address] 01/24/2021, 5:41 PM

## 2021-01-25 ENCOUNTER — Other Ambulatory Visit: Payer: Self-pay

## 2021-01-25 ENCOUNTER — Observation Stay (HOSPITAL_COMMUNITY): Payer: Medicare Other | Admitting: Anesthesiology

## 2021-01-25 ENCOUNTER — Encounter (HOSPITAL_COMMUNITY)
Admission: EM | Disposition: A | Discharge status: Home / Self Care | Payer: Self-pay | Source: Home / Self Care | Attending: Emergency Medicine

## 2021-01-25 ENCOUNTER — Encounter (HOSPITAL_COMMUNITY): Payer: Self-pay | Admitting: Family Medicine

## 2021-01-25 DIAGNOSIS — K449 Diaphragmatic hernia without obstruction or gangrene: Secondary | ICD-10-CM | POA: Diagnosis not present

## 2021-01-25 DIAGNOSIS — D649 Anemia, unspecified: Secondary | ICD-10-CM | POA: Diagnosis not present

## 2021-01-25 DIAGNOSIS — R195 Other fecal abnormalities: Secondary | ICD-10-CM | POA: Diagnosis not present

## 2021-01-25 DIAGNOSIS — Z20822 Contact with and (suspected) exposure to covid-19: Secondary | ICD-10-CM | POA: Diagnosis not present

## 2021-01-25 DIAGNOSIS — D509 Iron deficiency anemia, unspecified: Secondary | ICD-10-CM | POA: Diagnosis not present

## 2021-01-25 HISTORY — PX: ESOPHAGOGASTRODUODENOSCOPY: SHX5428

## 2021-01-25 LAB — GLUCOSE, CAPILLARY
Glucose-Capillary: 167 mg/dL — ABNORMAL HIGH (ref 70–99)
Glucose-Capillary: 149 mg/dL — ABNORMAL HIGH (ref 70–99)
Glucose-Capillary: 147 mg/dL — ABNORMAL HIGH (ref 70–99)
Glucose-Capillary: 201 mg/dL — ABNORMAL HIGH (ref 70–99)

## 2021-01-25 LAB — TYPE AND SCREEN
ABO/RH(D): O POS
Antibody Screen: NEGATIVE
Unit division: 0

## 2021-01-25 LAB — BASIC METABOLIC PANEL
Anion gap: 15 (ref 5–15)
BUN: 172 mg/dL — ABNORMAL HIGH (ref 8–23)
CO2: 26 mmol/L (ref 22–32)
Calcium: 9.4 mg/dL (ref 8.9–10.3)
Chloride: 91 mmol/L — ABNORMAL LOW (ref 98–111)
Creatinine, Ser: 3.12 mg/dL — ABNORMAL HIGH (ref 0.44–1.00)
GFR, Estimated: 15 mL/min — ABNORMAL LOW (ref >60)
Glucose, Bld: 166 mg/dL — ABNORMAL HIGH (ref 70–99)
Potassium: 3.3 mmol/L — ABNORMAL LOW (ref 3.5–5.1)
Sodium: 132 mmol/L — ABNORMAL LOW (ref 135–145)

## 2021-01-25 LAB — CBC
HCT: 25.1 % — ABNORMAL LOW (ref 36.0–46.0)
Hemoglobin: 7.8 g/dL — ABNORMAL LOW (ref 12.0–15.0)
MCH: 25.1 pg — ABNORMAL LOW (ref 26.0–34.0)
MCHC: 31.1 g/dL (ref 30.0–36.0)
MCV: 80.7 fL (ref 80.0–100.0)
Platelets: 362 10*3/uL (ref 150–400)
RBC: 3.11 MIL/uL — ABNORMAL LOW (ref 3.87–5.11)
RDW: 18 % — ABNORMAL HIGH (ref 11.5–15.5)
WBC: 11.3 10*3/uL — ABNORMAL HIGH (ref 4.0–10.5)
nRBC: 0 % (ref 0.0–0.2)

## 2021-01-25 LAB — BPAM RBC
Blood Product Expiration Date: 202205162359
ISSUE DATE / TIME: 202204192000
Unit Type and Rh: 5100

## 2021-01-25 LAB — MAGNESIUM: Magnesium: 1.5 mg/dL — ABNORMAL LOW (ref 1.7–2.4)

## 2021-01-25 LAB — HIV ANTIBODY (ROUTINE TESTING W REFLEX): HIV Screen 4th Generation wRfx: NONREACTIVE

## 2021-01-25 SURGERY — EGD (ESOPHAGOGASTRODUODENOSCOPY)
Anesthesia: Monitor Anesthesia Care

## 2021-01-25 MED ORDER — OXYCODONE HCL 5 MG PO TABS: 5.0000 mg | ORAL_TABLET | Freq: Once | ORAL | Status: DC

## 2021-01-25 MED ORDER — POTASSIUM CHLORIDE CRYS ER 20 MEQ PO TBCR
20.0000 meq | EXTENDED_RELEASE_TABLET | Freq: Once | ORAL | Status: AC
  Administered 2021-01-25: 20 meq via ORAL
  Filled 2021-01-25: qty 1

## 2021-01-25 MED ORDER — MAGNESIUM SULFATE 2 GM/50ML IV SOLN
2.0000 g | Freq: Once | INTRAVENOUS | Status: AC
  Administered 2021-01-25: 2 g via INTRAVENOUS
  Filled 2021-01-25: qty 50

## 2021-01-25 MED ORDER — LIP MEDEX EX OINT: TOPICAL_OINTMENT | CUTANEOUS | Status: DC | PRN

## 2021-01-25 MED ORDER — LIDOCAINE 2% (20 MG/ML) 5 ML SYRINGE
INTRAMUSCULAR | Status: DC | PRN
  Administered 2021-01-25: 40 mg via INTRAVENOUS

## 2021-01-25 MED ORDER — BLISTEX MEDICATED EX OINT
TOPICAL_OINTMENT | CUTANEOUS | Status: DC | PRN
  Filled 2021-01-25 (×2): qty 6.3

## 2021-01-25 MED ORDER — PROPOFOL 10 MG/ML IV BOLUS
INTRAVENOUS | Status: DC | PRN
  Administered 2021-01-25: 10 mg via INTRAVENOUS

## 2021-01-25 MED ORDER — SODIUM CHLORIDE 0.9 % IV SOLN: INTRAVENOUS | Status: DC

## 2021-01-25 MED ORDER — PHENYLEPHRINE 40 MCG/ML (10ML) SYRINGE FOR IV PUSH (FOR BLOOD PRESSURE SUPPORT)
PREFILLED_SYRINGE | INTRAVENOUS | Status: DC | PRN
  Administered 2021-01-25: 80 ug via INTRAVENOUS
  Administered 2021-01-25: 120 ug via INTRAVENOUS

## 2021-01-25 MED ORDER — PROPOFOL 10 MG/ML IV BOLUS
INTRAVENOUS | Status: AC
  Filled 2021-01-25: qty 20

## 2021-01-25 MED ORDER — LIP MEDEX EX OINT
TOPICAL_OINTMENT | CUTANEOUS | Status: DC | PRN
  Filled 2021-01-25 (×2): qty 7

## 2021-01-25 MED ORDER — PROPOFOL 500 MG/50ML IV EMUL
INTRAVENOUS | Status: DC | PRN
  Administered 2021-01-25: 100 ug/kg/min via INTRAVENOUS

## 2021-01-25 MED ORDER — OXYCODONE HCL 5 MG PO TABS
5.0000 mg | ORAL_TABLET | Freq: Once | ORAL | Status: AC
  Administered 2021-01-26: 5 mg via ORAL

## 2021-01-25 NOTE — Progress Notes (Signed)
PT Cancellation Note  Patient Details Name: [name] [name] MRN: [id number] DOB: July 29, 1951   Cancelled Treatment:    Reason Eval/Treat Not Completed: Patient at procedure or test/unavailable   Claretha Cooper 01/25/2021, 1:11 PM  [place] Pager [phone] Office [phone]

## 2021-01-25 NOTE — Op Note (Signed)
[facility] Patient Name: [name] [name] Procedure Date: 01/25/2021 MRN: [id number] Attending MD: Clarene Essex , MD Date of Birth: 1951/06/01 CSN: [id number] Age: 70 Admit Type: Outpatient Procedure:                Upper GI endoscopy Indications:              Iron deficiency anemia, Heme positive stool Providers:                Clarene Essex, MD, Clyde Lundborg, RN, Ladona Ridgel,                            Technician Referring MD:              Medicines:                Propofol total dose 160 mg IV, 40 mg IV lidocaine Complications:            No immediate complications. Estimated Blood Loss:     Estimated blood loss: none. Estimated blood loss:                            none. Procedure:                Pre-Anesthesia Assessment:                           - Prior to the procedure, a History and Physical                            was performed, and patient medications and                            allergies were reviewed. The patient's tolerance of                            previous anesthesia was also reviewed. The risks                            and benefits of the procedure and the sedation                            options and risks were discussed with the patient.                            All questions were answered, and informed consent                            was obtained. Prior Anticoagulants: The patient has                            taken Plavix (clopidogrel), last dose was 2 days                            prior to procedure. ASA Grade Assessment: III - A  patient with severe systemic disease. After                            reviewing the risks and benefits, the patient was                            deemed in satisfactory condition to undergo the                            procedure.                           After obtaining informed consent, the endoscope was                            passed under direct vision.  Throughout the                            procedure, the patient's blood pressure, pulse, and                            oxygen saturations were monitored continuously. The                            GIF-H190 ([id number]) Olympus gastroscope was                            introduced through the mouth, and advanced to the                            third part of duodenum. The upper GI endoscopy was                            accomplished without difficulty. The patient                            tolerated the procedure well. Scope In: Scope Out: Findings:      The larynx was normal.      A Questionable tiny hiatal hernia was present.      Localized moderate inflammation With flecks of old blood characterized       by linear erosions was found in the gastric body.      The ampulla, duodenal bulb, first portion of the duodenum, second       portion of the duodenum, major papilla, area of the papilla and third       portion of the duodenum were normal.      The exam was otherwise without abnormality.      Hematin (altered blood/coffee-ground-like material) was found in the       stomach.      Localized mild inflammation characterized by erythema and granularity       was found in the gastric antrum. Impression:               - Normal larynx.                           -  Questionable tiny hiatal hernia.                           - Erosive gastritis With flecks of old blood. They                            actually look like Cameron erosions which usually                            go along with a significant hiatal hernia which she                            does not have                           - Normal ampulla, duodenal bulb, first portion of                            the duodenum, second portion of the duodenum, major                            papilla, area of the papilla and third portion of                            the duodenum.                           - The examination was  otherwise normal.                           - Hematin (altered blood/coffee-ground-like                            material) in the stomach.                           - Chronic gastritis.                           - No specimens collected. Moderate Sedation:      Not Applicable - Patient had care per Anesthesia. Recommendation:           - Soft diet today.                           - Continue present medications.                           - Return to GI clinic PRN.                           - Telephone GI clinic if symptomatic PRN. Procedure Code(s):        --- Professional ---                           [phone], Esophagogastroduodenoscopy, flexible,  transoral; diagnostic, including collection of                            specimen(s) by brushing or washing, when performed                            (separate procedure) Diagnosis Code(s):        --- Professional ---                           K44.9, Diaphragmatic hernia without obstruction or                            gangrene                           D50.9, Iron deficiency anemia, unspecified                           R19.5, Other fecal abnormalities CPT copyright 2019 [employer]. All rights reserved. The codes documented in this report are preliminary and upon coder review may  be revised to meet current compliance requirements. Clarene Essex, MD 01/25/2021 2:35:37 PM This report has been signed electronically. Number of Addenda: 0

## 2021-01-25 NOTE — Transfer of Care (Signed)
Immediate Anesthesia Transfer of Care Note  Patient: DAMA HEDGEPETH  Procedure(s) Performed: ESOPHAGOGASTRODUODENOSCOPY (EGD) (N/A )  Patient Location: Endoscopy Unit  Anesthesia Type:MAC  Level of Consciousness: awake, alert  and oriented  Airway & Oxygen Therapy: Patient Spontanous Breathing and Patient connected to face mask oxygen  Post-op Assessment: Report given to RN and Post -op Vital signs reviewed and stable  Post vital signs: Reviewed and stable  Last Vitals:  Vitals Value Taken Time  BP 121/56   Temp    Pulse 81 01/25/21 1426  Resp 18 01/25/21 1426  SpO2 100 % 01/25/21 1426  Vitals shown include unvalidated device data.  Last Pain:  Vitals:   01/25/21 1258  TempSrc: Oral  PainSc: 3          Complications: No complications documented.

## 2021-01-25 NOTE — Anesthesia Postprocedure Evaluation (Signed)
Anesthesia Post Note  Patient: [name] [name]  Procedure(s) Performed: ESOPHAGOGASTRODUODENOSCOPY (EGD) (N/A )     Patient location during evaluation: PACU Anesthesia Type: MAC Level of consciousness: oriented and awake and alert Pain management: pain level controlled Vital Signs Assessment: post-procedure vital signs reviewed and stable Respiratory status: spontaneous breathing, respiratory function stable and patient connected to nasal cannula oxygen Cardiovascular status: blood pressure returned to baseline and stable Postop Assessment: no headache, no backache and no apparent nausea or vomiting Anesthetic complications: no   No complications documented.  Last Vitals:  Vitals:   01/25/21 1440 01/25/21 1450  BP: (!) 136/52 (!) 137/46  Pulse: 76 75  Resp: 16 14  Temp:    SpO2: 99% 93%    Last Pain:  Vitals:   01/25/21 1450  TempSrc:   PainSc: 0-No pain                 Tyrene Nader

## 2021-01-25 NOTE — Anesthesia Preprocedure Evaluation (Addendum)
Anesthesia Evaluation  Patient identified by MRN, date of birth, ID band Patient awake    Reviewed: Allergy & Precautions, NPO status , Patient's Chart, lab work & pertinent test results, reviewed documented beta blocker date and time   Airway Mallampati: II  TM Distance: >3 FB Neck ROM: Full    Dental  (+) Edentulous Upper, Dental Advisory Given   Pulmonary shortness of breath and with exertion, asthma , sleep apnea and Continuous Positive Airway Pressure Ventilation , COPD,  COPD inhaler, Current Smoker and Patient abstained from smoking.,  Unable to tolerate cpap COPD- symbicort and albuterol- last used albuterol 2-3 weeks ago 5-6 cigarettes per day    Pulmonary exam normal breath sounds clear to auscultation       Cardiovascular hypertension, Pt. on medications and Pt. on home beta blockers + Peripheral Vascular Disease and +CHF  Normal cardiovascular exam Rhythm:Regular Rate:Normal  false positive stress test with normal coronaries (2015),   PVD s/p stents BLE- on plavix  EKG 01/05/21 NSR, PVC, LVH, borderline prolonged QTc  Last echo 03/2019: 1. The left ventricle has mild-moderately reduced systolic function, with an ejection fraction of 40-45%. The cavity size was normal. Left ventricular diastolic Doppler parameters are consistent with impaired relaxation.  2. Left atrial size was not well visualized.  3. The aortic root and ascending aorta are normal in size and structure.  4. The interatrial septum was not well visualized.   01/2020 admission for UTI and subsequent sepsis- acute HF exacerbation during this admission, has been following outpt with HF clinic   Neuro/Psych  Headaches, TIAnegative psych ROS   GI/Hepatic Neg liver ROS, hiatal hernia, GERD  Medicated and Controlled,Epigastric pain   Endo/Other  diabetes, Poorly Controlled, Type 2, Insulin DependentHypothyroidism Morbid obesityGout  Renal/GU CRF and  ESRFRenal diseaseLast dialysis yesterday Bladder dysfunction  Stress incontinence     Musculoskeletal  (+) Arthritis , Osteoarthritis,  Chronic pain   Abdominal (+) + obese,   Peds  Hematology  (+) Blood dyscrasia, anemia , Plavix therapy last dose 4/18   Anesthesia Other Findings   Reproductive/Obstetrics negative OB ROS                           Anesthesia Physical  Anesthesia Plan  ASA: IV  Anesthesia Plan: MAC   Post-op Pain Management:  Regional for Post-op pain   Induction:   PONV Risk Score and Plan: 1 and Propofol infusion and Treatment may vary due to age or medical condition  Airway Management Planned: Nasal Cannula and Natural Airway  Additional Equipment: None  Intra-op Plan:   Post-operative Plan:   Informed Consent: I have reviewed the patients History and Physical, chart, labs and discussed the procedure including the risks, benefits and alternatives for the proposed anesthesia with the patient or authorized representative who has indicated his/her understanding and acceptance.     Dental advisory given  Plan Discussed with: CRNA  Anesthesia Plan Comments:         Anesthesia Quick Evaluation

## 2021-01-25 NOTE — Consult Note (Signed)
Referring Provider: Dr. Vance Gather [facility]) Primary Care Physician:  [name], MD Primary Gastroenterologist:  [facility] Vicie Mutters, [place]; prior patient of Dr. Penelope Coop)  Reason for Consultation:  Acute on chronic anemia  HPI: [name] [name] is a 70 y.o. female with past medical history pertinent for stage IV CKD with AVF not yet on HD, CHF, TIA (on Plavix), COPD, GERD, type 2 DM, HTN, and chronic IDA on IV iron infusions and EPO injections with history of blood transfusion presenting for consultation of acute or chronic anemia.  Patient was seen by Vicie Mutters, PA-C, with [facility] yesterday.  Patient was having worsening fatigue and was noted to have worsening anemia.  Patient also reported one episode of chest pain and thus was sent to the ED.  Hgb 9.6 as of 01/05/21, decreased to 7.5 as of 01/23/21.  She was also heme-occult positive.  Of note, she also had a syncopal episode 01/05/21.  She reports some abdominal pain.  Previously in upper abdomen, now in the lower abdomen.  Has a history of constipation and was on Amitiza (she cannot recall if she is currently taking it).  Reports intermittent nausea but denies vomiting.  Pantoprazole and Reglan are listed in medications but patient unsure if she is taking them, stating she takes a lot of medications.  Reports dark but not black stool.  Denies diarrhea or hematochezia.  Denies GERD or dysphagia.  Has had 80 lbs intentional weight loss over ~2 years.  She is on Plavix, last dose 4/18.  Denies ASA and NSAID use.  Last colonoscopy in 2018 was pertinent for diverticulosis and internal hemorrhoids.  No prior EGD.  CT without contrast 12/2019: Status post cholecystectomy, left hemicolectomy, and hysterectomy.  Hepatomegaly with nodular hepatic contour, at least raising the possibility of early cirrhosis. Bilateral renal adenomas.  Past Medical History:  Diagnosis Date  . Anemia   . Arthritis   . Asthma   . Bronchitis   . CHF  (congestive heart failure) ([place])   . Chronic kidney disease   . Chronic kidney disease   . Chronic pain syndrome   . COPD (chronic obstructive pulmonary disease) ([place])   . Cramping of feet   . Diabetes mellitus   . Diabetes mellitus without complication ([place])   . Dyspnea   . Full dentures   . GERD (gastroesophageal reflux disease)   . Gout   . Headache   . History of hiatal hernia   . History of hip replacement, total   . History of transfusion   . Hx MRSA infection    abscess left groin  . Hx of cardiac cath 06/2014  . Hyperlipidemia   . Hypertension   . Hypothyroid   . 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 Morbidly obese ([place])   . Peripheral vascular disease ([place])   . Positive cardiac stress test 12/2013   anterior and lateral ischemia on Myoview  . Sleep apnea    UNABLE TO TOLERATE C PAP  . Stress incontinence   . Thyroid disease   . TIA (transient ischemic attack)     X2 NO RESIDUAL PROBLEMS  . Wears glasses     Past Surgical History:  Procedure Laterality Date  . ABDOMINAL HYSTERECTOMY    . BACK SURGERY  2020  . BASCILIC VEIN TRANSPOSITION Left 04/05/2020   Procedure: BASILIC VEIN TRANSPOSITION FIRST STAGE;  Surgeon: Angelia Mould, MD;  Location: [place];  Service: Vascular;  Laterality: Left;  . COLON  SURGERY  1995   DUE TO POLYP  . DILATION AND CURETTAGE OF UTERUS    . FOREARM FRACTURE SURGERY     Left arm  . HERNIA REPAIR     w/ mesh  . INCISION AND DRAINAGE ABSCESS Left 02/04/2013   Procedure: INCISION AND DRAINAGE LEFT BUTTOCK ABSCESS; INCISION AND DRAINAGE LEFT BREAST ABSCESS;  Surgeon: Harl Bowie, MD;  Location: [place];  Service: General;  Laterality: Left;  . INCISION AND DRAINAGE ABSCESS N/A 02/12/2013   Procedure: INCISION AND DEBRIDEMENT BUTTOCK WOUND ;  Surgeon: [name]. Georgette Dover, MD;  Location: [place];  Service: General;  Laterality: N/A;  . INCISION AND DRAINAGE ABSCESS N/A 02/14/2013   Procedure: INCISION AND DRAINAGE/DRESSING CHANGE;   Surgeon: Harl Bowie, MD;  Location: [place];  Service: General;  Laterality: N/A;  . INCISION AND DRAINAGE ABSCESS N/A 03/21/2015   Procedure: INCISION AND DRAINAGE PUBIC ABSCESS;  Surgeon: Excell Seltzer, MD;  Location: [facility];  Service: General;  Laterality: N/A;  . INCISION AND DRAINAGE PERIRECTAL ABSCESS Left 02/10/2013   Procedure: IRRIGATION AND DEBRIDEMENT OF BUTTOCK/PERINEAL ABSCESS;  Surgeon: [name]. Georgette Dover, MD;  Location: [place];  Service: General;  Laterality: Left;  . INCISION AND DRAINAGE PERIRECTAL ABSCESS N/A 02/16/2013   Procedure: IRRIGATION AND DEBRIDEMENT PERINEAL ABSCESS;  Surgeon: Zenovia Jarred, MD;  Location: [place];  Service: General;  Laterality: N/A;  . IRRIGATION AND DEBRIDEMENT ABSCESS N/A 02/06/2013   Procedure: IRRIGATION AND DEBRIDEMENT BUTTOCK ABSCESS AND DRESSING CHANGE;  Surgeon: Harl Bowie, MD;  Location: [place];  Service: General;  Laterality: N/A;  . IRRIGATION AND DEBRIDEMENT ABSCESS Left 02/08/2013   Procedure: IRRIGATION AND DEBRIDEMENT ABSCESS/DRESSING CHANGE;  Surgeon: Gwenyth Ober, MD;  Location: [place];  Service: General;  Laterality: Left;  . JOINT REPLACEMENT  2010 / 2012   . LAPAROSCOPIC CHOLECYSTECTOMY    . LEFT HEART CATHETERIZATION WITH CORONARY ANGIOGRAM N/A 07/02/2014   Procedure: LEFT HEART CATHETERIZATION WITH CORONARY ANGIOGRAM;  Surgeon: Lorretta Harp, MD;  Location: [facility];  Service: Cardiovascular;  Laterality: N/A;  . LOWER EXTREMITY ANGIOGRAM Right 04/20/2016   Procedure: Lower Extremity Angiogram;  Surgeon: Elam Dutch, MD;  Location: [place] [facility];  Service: Cardiovascular;  Laterality: Right;  . MULTIPLE TOOTH EXTRACTIONS    . PERIPHERAL VASCULAR CATHETERIZATION N/A 04/20/2016   Procedure: Abdominal Aortogram;  Surgeon: Elam Dutch, MD;  Location: [place] [facility];  Service: Cardiovascular;  Laterality: N/A;  . PERIPHERAL VASCULAR CATHETERIZATION Right 04/20/2016   Procedure: Peripheral Vascular  Intervention;  Surgeon: Elam Dutch, MD;  Location: [place] [facility];  Service: Cardiovascular;  Laterality: Right;  popiteal  . RIGHT HEART CATH N/A 10/27/2018   Procedure: RIGHT HEART CATH;  Surgeon: Larey Dresser, MD;  Location: [place] [facility];  Service: Cardiovascular;  Laterality: N/A;  . RIGHT HEART CATH N/A 03/20/2019   Procedure: RIGHT HEART CATH;  Surgeon: Larey Dresser, MD;  Location: [place] [facility];  Service: Cardiovascular;  Laterality: N/A;  . THYROIDECTOMY    . TOTAL HIP REVISION Right 08/11/2014   Procedure: RIGHT ACETABULAR REVISION;  Surgeon: Gearlean Alf, MD;  Location[facility];  Service: Orthopedics;  Laterality: Right;  [facility] Kitchen VASCULAR SURGERY      Prior to Admission medications   Medication Sig Start Date End Date Taking? Authorizing Provider  albuterol (PROVENTIL) (2.5 MG/3ML) 0.083% nebulizer solution Take 2.5 mg by nebulization every 6 (six) hours as needed for  wheezing or shortness of breath.   Yes [provider]  albuterol (VENTOLIN HFA) 108 (90 Base) MCG/ACT inhaler Inhale 2 puffs into the lungs 4 (four) times daily as needed for wheezing or shortness of breath.   Yes [provider]  allopurinol (ZYLOPRIM) 100 MG tablet Take 100 mg by mouth daily. 09/14/20  Yes [provider]  B Complex Vitamins (VITAMIN B COMPLEX PO) Take 1 capsule by mouth daily.   Yes [provider]  budesonide-formoterol (SYMBICORT) 160-4.5 MCG/ACT inhaler Inhale 2 puffs into the lungs 2 (two) times daily as needed (for SOB).    Yes [provider]  carboxymethylcellulose (REFRESH PLUS) 0.5 % SOLN Place 1 drop into both eyes daily as needed (dry eyes).   Yes [provider]  carvedilol (COREG) 12.5 MG tablet Take 12.5 mg by mouth 2 (two) times daily. 01/01/21  Yes [provider]  cholecalciferol (VITAMIN D3) 25 MCG (1000 UNIT) tablet Take 1,000 Units by mouth daily.   Yes [provider]  cinacalcet (SENSIPAR)  30 MG tablet Take 30 mg by mouth daily. 12/08/20  Yes [provider]  clopidogrel (PLAVIX) 75 MG tablet TAKE 1 TABLET BY MOUTH EVERY DAY Patient taking differently: Take 75 mg by mouth daily. 06/18/17  Yes Waynetta Sandy, MD  clotrimazole-betamethasone (LOTRISONE) cream Apply 1 application topically 2 (two) times daily as needed (rash/itching). 09/14/20  Yes [provider]  diclofenac Sodium (VOLTAREN) 1 % GEL Apply 1 application topically 4 (four) times daily as needed (pain). 08/15/20  Yes [provider]  diphenhydrAMINE (BENADRYL) 25 MG tablet Take 25 mg by mouth every 6 (six) hours as needed for allergies.   Yes [provider]  Dulaglutide (TRULICITY) 1.5 WC/5.8NI SOPN Inject 1.5 mg into the skin every Monday.   Yes [provider]  fluticasone (FLONASE) 50 MCG/ACT nasal spray Place 2 sprays into the nose as needed for allergies.    Yes [provider]  hydrOXYzine (ATARAX/VISTARIL) 25 MG tablet Take 25 mg by mouth 2 (two) times daily as needed for anxiety. 12/08/20  Yes [provider]  insulin lispro protamine-lispro (HUMALOG 75/25 MIX) (75-25) 100 UNIT/ML SUSP injection Inject 30-40 Units into the skin See admin instructions. Inject 40 units into the skin morning and 30 units in the evening   Yes [provider]  levothyroxine (SYNTHROID) 150 MCG tablet Take 150 mcg by mouth daily. 12/10/20  Yes [provider]  magnesium oxide (MAG-OX) 400 (241.3 Mg) MG tablet Take 1 tablet (400 mg total) by mouth 2 (two) times daily. 01/14/20  Yes Lavina Hamman, MD  metolazone (ZAROXOLYN) 5 MG tablet Take 5 mg by mouth See admin instructions. Takes 1 tablet on Monday, Wednesday and Friday 12/06/20  Yes [provider]  Multiple Vitamin (MULTIVITAMIN WITH MINERALS) TABS tablet Take 1 tablet by mouth daily.   Yes [provider]  MYRBETRIQ 50 MG TB24 tablet Take 50 mg by mouth daily. 01/03/21  Yes [provider]  oxyCODONE-acetaminophen (PERCOCET) 10-325 MG tablet Take 1 tablet by mouth every 6 (six) hours as needed for pain. Patient taking differently: Take 1 tablet by mouth every 6 (six) hours. 04/05/20  Yes Dagoberto Ligas, PA-C  pantoprazole (PROTONIX) 40 MG tablet Take 40 mg by mouth daily. 11/24/20  Yes [provider]  polyvinyl alcohol (LIQUIFILM TEARS) 1.4 % ophthalmic solution Place 2 drops into both eyes daily as needed for dry eyes.   Yes [provider]  Potassium Chloride  ER 20 MEQ TBCR Take 1 tablet by mouth 3 (three) times a week. Tuesday, Thursdays and Saturdays 11/24/20  Yes [provider]  torsemide (DEMADEX) 100 MG tablet Take 100 mg by mouth 2 (two) times daily. 12/05/20  Yes [provider]  TRULICITY 1.5 PY/1.9JK SOPN Inject 1.5 mg into the skin once a week. Monday 11/11/20  Yes [provider]  metoCLOPramide (REGLAN) 5 MG tablet Take 5 mg by mouth 3 (three) times daily. 01/20/21   [provider]    Scheduled Meds: . allopurinol  100 mg Oral Daily  . carvedilol  12.5 mg Oral BID WC  . cinacalcet  30 mg Oral Q supper  . doxycycline  100 mg Oral Q12H  . insulin aspart  0-20 Units Subcutaneous TID WC  . insulin aspart  0-5 Units Subcutaneous QHS  . levothyroxine  150 mcg Oral Q0600  . metolazone  5 mg Oral Q M,W,F  . mirabegron ER  50 mg Oral Daily  . mometasone-formoterol  2 puff Inhalation BID  . oxyCODONE  5 mg Oral Once  . pantoprazole (PROTONIX) IV  40 mg Intravenous Q24H  . sodium chloride flush  3 mL Intravenous Q12H  . torsemide  100 mg Oral BID   Continuous Infusions: PRN Meds:.albuterol, fluticasone, hydrOXYzine, ondansetron **OR** ondansetron (ZOFRAN) IV, oxyCODONE-acetaminophen **AND** oxyCODONE, polyvinyl alcohol  Allergies as of 01/24/2021 - Review Complete 01/24/2021  Allergen Reaction Noted  . Nebivolol Swelling 04/17/2015  . Ace inhibitors Swelling and Other (See Comments) 11/21/2010  .  Ace inhibitors Swelling 01/05/2021  . Morphine and related Itching 01/05/2021  . Morphine and related Itching 11/21/2010    Family History  Problem Relation Age of Onset  . Diabetes Brother   . Cardiomyopathy Mother   . Heart disease Mother   . Cancer Father     Social History   Socioeconomic History  . Marital status: Legally Separated    Spouse name: Not on file  . Number of children: 1  . Years of education: college  . Highest education level: Not on file  Occupational History    Comment: Disabled  Tobacco Use  . Smoking status: Current Some Day Smoker    Packs/day: 0.25    Years: 40.00    Pack years: 10.00    Types: Cigarettes  . Smokeless tobacco: Never Used  Vaping Use  . Vaping Use: Never used  Substance and Sexual Activity  . Alcohol use: Yes    Alcohol/week: 1.0 standard drink    Types: 1 Standard drinks or equivalent per week    Comment: OCC  . Drug use: No  . Sexual activity: Not on file  Other Topics Concern  . Not on file  Social History Narrative   ** Merged History Encounter **       Patient is separated  and lives at home with her friend. Patient is disabled. Education one year of college Right handed Caffeine two cups daily.     Social Determinants of Health   Financial Resource Strain: Not on file  Food Insecurity: Not on file  Transportation Needs: Not on file  Physical Activity: Not on file  Stress: Not on file  Social Connections: Not on file  Intimate Partner Violence: Not on file    Review of Systems: Review of Systems  Constitutional: Positive for malaise/fatigue. Negative for chills and fever.  HENT: Negative for hearing loss and tinnitus.   Eyes: Negative for pain and redness.  Respiratory: Negative for cough and  shortness of breath.   Cardiovascular: Negative for chest pain and palpitations.  Gastrointestinal: Positive for abdominal pain, constipation and nausea. Negative for blood in stool, diarrhea, heartburn, melena and  vomiting.  Genitourinary: Negative for flank pain and hematuria.  Musculoskeletal: Negative for back pain and joint pain.  Skin: Negative for itching and rash.  Neurological: Positive for weakness. Negative for seizures.  Psychiatric/Behavioral: Negative for substance abuse. The patient is not nervous/anxious.     Physical Exam: Vital signs: Vitals:   01/25/21 0515 01/25/21 0907  BP: (!) 145/68   Pulse: 79   Resp: 16   Temp: 98.2 F (36.8 C)   SpO2: 95% 90%     Physical Exam Vitals reviewed.  Constitutional:      General: She is not in acute distress.    Appearance: She is overweight.  HENT:     Head: Normocephalic and atraumatic.     Nose: Nose normal. No congestion.     Mouth/Throat:     Mouth: Mucous membranes are moist.     Pharynx: Oropharynx is clear.  Eyes:     General: No scleral icterus.    Extraocular Movements: Extraocular movements intact.     Comments: Conjunctival pallor  Cardiovascular:     Rate and Rhythm: Normal rate and regular rhythm.     Pulses: Normal pulses.  Pulmonary:     Effort: Pulmonary effort is normal. No respiratory distress.     Breath sounds: Normal breath sounds.  Abdominal:     General: Bowel sounds are normal. There is no distension.     Palpations: Abdomen is soft. There is no mass.     Tenderness: There is abdominal tenderness (mild, LLQ). There is no guarding or rebound.     Hernia: No hernia is present.  Musculoskeletal:        General: No tenderness.     Cervical back: Normal range of motion and neck supple.     Right lower leg: Edema present.     Left lower leg: Edema present.  Skin:    General: Skin is warm and dry.  Neurological:     General: No focal deficit present.     Mental Status: She is alert and oriented to person, place, and time.  Psychiatric:        Mood and Affect: Mood normal.        Behavior: Behavior normal. Behavior is cooperative.      GI:  Lab Results: Recent Labs    01/23/21 1049  01/24/21 1426 01/25/21 0540  WBC  --  9.5 11.3*  HGB 7.5* 7.7* 7.8*  HCT  --  25.5* 25.1*  PLT  --  395 362   BMET Recent Labs    01/24/21 1426 01/25/21 0540  NA 135 132*  K 2.9* 3.3*  CL 92* 91*  CO2 29 26  GLUCOSE 148* 166*  BUN 168* 172*  CREATININE 3.03* 3.12*  CALCIUM 9.9 9.4   LFT Recent Labs    01/24/21 1426  PROT 7.3  ALBUMIN 3.2*  AST 21  ALT 18  ALKPHOS 61  BILITOT 0.6   PT/INR No results for input(s): LABPROT, INR in the last 72 hours.   Studies/Results: No results found.  Impression: Acute on chronic anemia: Hgb 7.7 on arrival yesterday, as compared to 9.6 on 01/05/21.  Currently 7.8 after 1u pRBCs yesterday evening (inadequate rise) -Hemoccult positive  History of TIA, on Plavix, last dose 4/18  CKD stage 4: BUN 172/ Cr 3.12  Plan: EGD today for further evaluation.  I thoroughly discussed the procedure with the patient to include nature, alternatives, benefits, and risks (including but not limited to bleeding, infection, perforation, anesthesia/cardiac and pulmonary complications).  Patient verbalized understanding and gave verbal consent to proceed with EGD.  Pending EGD findings, recommend CT of the abdomen/pelvis with oral contrast to rule out any large lesions.  [facility] will follow.   LOS: 0 days   Salley Slaughter  PA-C 01/25/2021, 12:29 PM  Contact #  [phone]

## 2021-01-25 NOTE — Progress Notes (Addendum)
PROGRESS NOTE    [name] [name]    Code Status: Full Code  SWF:[id number] DOB: Mar 05, 1951 DOA: 01/24/2021 LOS: 0 days  PCP: [name], MD CC:  Chief Complaint  Patient presents with  . Rectal Bleeding  . Abnormal Lab       Hospital Summary   This is a 70 year old female with a history of CKD 5 (not on HD, follows with Dr. [name]), chronic combined systolic and diastolic CHF, COPD, GERD, IDDM, HTN, chronic iron deficiency anemia and anemia of CKD who gets regular IV iron infusions and EPO injections who was sent to the ED on 4/19 from the [facility] office due to concern for GI bleeding and symptomatic anemia (Hb 7.5) with a syncopal episode several weeks ago and fatigue. She also reported a left buttock boil that recently ruptured.   ED Course: Afebrile, hypertensive, in no distress. +FOBT without gross bleeding. Hgb 7.7g/dl. Metabolic panel shows hypokalemia and derangements near the patient's baseline including BUN 168, Cr 3.03. Admission requested, GI consulted. She received 1 u PRBCs upon admission due to symptomatic anemia.  Patient planned for EGD today, results currently pending.  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 Acute on chronic symptomatic anemia suspected secondary to GI bleed with history of anemia of CKD and iron deficiency anemia a. Hemodynamically stable on room air b. HB 7.5 on admission and currently 7.8 despite 1 u PRBCs c. Received EPO 4/18 and due for IV iron on 4/25 as  scheduled d. Plan for EGD today e. Continue empiric PPI f. Holding plavix  2. Cellulitis of buttock a. Afebrile but with leukocytosis b. Continue doxycycline  3. Leg cramping/right shoulder pain, likely musculoskeletal a. Reports that she uses a topical at home that is not on her med list and is going to have her son bring it later today b. On percocet and oxycodone  4. Hypokalemia a. Replete by mouth when taking PO  5. Hypomagnesemia a. replete  6. Chronic combined systolic/diastolic CHF, not in acute exacerbation a. Continue home torsemide, metolazone MWF and coreg  7. COPD, not in acute exacerbation a. Continue Dulera  8. IDDM a. Holding insulin 75/25 b. Continue HS and sliding scale since she is still NPO  9. Peripheral vascular disease a. Holding plavix  10. Chronic pain a. continue home meds  11. Hypothyroidism a. Continue synthroid  12. Recent syncopal episode a. Occurred a few weeks ago and sounds like a vasovagal episode at home. Had unremarkable head imaging at the time b. Has bilateral knee ecchymosis and left black eye c. PT eval  DVT prophylaxis: SCDs Start: 01/24/21 1744   Family Communication: patient updated  Disposition Plan:  Status is: Observation  The patient remains OBS appropriate and will d/c before 2 midnights.  Dispo: The patient is from: Home              Anticipated d/c is to: Home  Patient currently is not medically stable to d/c.   Difficult to place patient No           Pressure injury documentation    None  Consultants  GI  Procedures  4/20: EGD  Antibiotics   Anti-infectives (From admission, onward)   Start     Dose/Rate Route Frequency Ordered Stop   01/24/21 2200  [MAR Hold]  doxycycline (VIBRA-TABS) tablet 100 mg        (MAR Hold since Wed 01/25/2021 at 1253.Hold Reason: Transfer to a Procedural area.)   100 mg Oral Every 12 hours 01/24/21 1703          Subjective   States that she  had significant bilateral leg cramping, L>R overnight. She says this is not new but she typically uses a topical at home that helps but she doesn't know the name of it. Denies any episodes of bleeding. No other complaints or issues overnight.   Objective   Vitals:   01/24/21 2242 01/25/21 0224 01/25/21 0515 01/25/21 0907  BP: (!) 145/77 139/60 (!) 145/68   Pulse: 85 78 79   Resp: 18 16 16    Temp: 98.3 F (36.8 C) 98.5 F (36.9 C) 98.2 F (36.8 C)   TempSrc: Oral Oral Oral   SpO2: 97% 100% 95% 90%  Weight:      Height:        Intake/Output Summary (Last 24 hours) at 01/25/2021 1304 Last data filed at 01/25/2021 0516 Gross per 24 hour  Intake 282 ml  Output 400 ml  Net -118 ml   Filed Weights   01/24/21 1223 01/24/21 1818  Weight: 90.7 kg 96 kg    Examination:  Physical Exam Vitals and nursing note reviewed.  Constitutional:      Appearance: Normal appearance.  HENT:     Head: Normocephalic.     Comments: Left black eye Eyes:     Conjunctiva/sclera: Conjunctivae normal.  Cardiovascular:     Rate and Rhythm: Normal rate and regular rhythm.  Pulmonary:     Effort: Pulmonary effort is normal.     Breath sounds: Normal breath sounds.  Abdominal:     General: Abdomen is flat.     Palpations: Abdomen is soft.  Musculoskeletal:        General: No swelling or tenderness.     Right knee: Ecchymosis present.     Left knee: Ecchymosis present.  Skin:    Coloration: Skin is not jaundiced or pale.  Neurological:     Mental Status: She is alert. Mental status is at baseline.  Psychiatric:        Mood and Affect: Mood normal.        Behavior: Behavior normal.     Data Reviewed: I have personally reviewed following labs and imaging studies  CBC: Recent Labs  Lab 01/23/21 1049 01/24/21 1426 01/25/21 0540  WBC  --  9.5 11.3*  HGB 7.5* 7.7* 7.8*  HCT  --  25.5* 25.1*  MCV  --  79.9* 80.7  PLT  --  395 174   Basic Metabolic Panel: Recent Labs  Lab  01/24/21 1426 01/25/21 0540  NA 135 132*  K 2.9* 3.3*  CL 92* 91*  CO2 29 26  GLUCOSE 148* 166*  BUN 168* 172*  CREATININE 3.03* 3.12*  CALCIUM 9.9 9.4  MG  --  1.5*   GFR: Estimated Creatinine Clearance: 18.1 mL/min (A) (by C-G formula based on SCr of 3.12 mg/dL (H)). Liver Function Tests:  Recent Labs  Lab 01/24/21 1426  AST 21  ALT 18  ALKPHOS 61  BILITOT 0.6  PROT 7.3  ALBUMIN 3.2*   No results for input(s): LIPASE, AMYLASE in the last 168 hours. No results for input(s): AMMONIA in the last 168 hours. Coagulation Profile: No results for input(s): INR, PROTIME in the last 168 hours. Cardiac Enzymes: No results for input(s): CKTOTAL, CKMB, CKMBINDEX, TROPONINI in the last 168 hours. BNP (last 3 results) No results for input(s): PROBNP in the last 8760 hours. HbA1C: No results for input(s): HGBA1C in the last 72 hours. CBG: Recent Labs  Lab 01/24/21 2121 01/25/21 0809 01/25/21 1154  GLUCAP 234* 167* 149*   Lipid Profile: No results for input(s): CHOL, HDL, LDLCALC, TRIG, CHOLHDL, LDLDIRECT in the last 72 hours. Thyroid Function Tests: No results for input(s): TSH, T4TOTAL, FREET4, T3FREE, THYROIDAB in the last 72 hours. Anemia Panel: Recent Labs    01/23/21 1042  FERRITIN 14  TIBC 416  IRON 18*   Sepsis Labs: No results for input(s): PROCALCITON, LATICACIDVEN in the last 168 hours.  Recent Results (from the past 240 hour(s))  Resp Panel by RT-PCR (Flu A&B, Covid) Nasopharyngeal Swab     Status: None   Collection Time: 01/24/21  2:27 PM   Specimen: Nasopharyngeal Swab; Nasopharyngeal(NP) swabs in vial transport medium  Result Value Ref Range Status   SARS Coronavirus 2 by RT PCR NEGATIVE NEGATIVE Final    Comment: (NOTE) SARS-CoV-2 target nucleic acids are NOT DETECTED.  The SARS-CoV-2 RNA is generally detectable in upper respiratory specimens during the acute phase of infection. The lowest concentration of SARS-CoV-2 viral copies this assay can  detect is 138 copies/mL. A negative result does not preclude SARS-Cov-2 infection and should not be used as the sole basis for treatment or other patient management decisions. A negative result may occur with  improper specimen collection/handling, submission of specimen other than nasopharyngeal swab, presence of viral mutation(s) within the areas targeted by this assay, and inadequate number of viral copies(<138 copies/mL). A negative result must be combined with clinical observations, patient history, and epidemiological information. The expected result is Negative.  Fact Sheet for Patients:  EntrepreneurPulse.com.au  Fact Sheet for Healthcare Providers:  IncredibleEmployment.be  This test is no t yet approved or cleared by the [place] FDA and  has been authorized for detection and/or diagnosis of SARS-CoV-2 by FDA under an Emergency Use Authorization (EUA). This EUA will remain  in effect (meaning this test can be used) for the duration of the COVID-19 declaration under Section 564(b)(1) of the Act, 21 U.S.C.section 360bbb-3(b)(1), unless the authorization is terminated  or revoked sooner.       Influenza A by PCR NEGATIVE NEGATIVE Final   Influenza B by PCR NEGATIVE NEGATIVE Final    Comment: (NOTE) The Xpert Xpress SARS-CoV-2/FLU/RSV plus assay is intended as an aid in the diagnosis of influenza from Nasopharyngeal swab specimens and should not be used as a sole basis for treatment. Nasal washings and aspirates are unacceptable for Xpert Xpress SARS-CoV-2/FLU/RSV testing.  Fact Sheet for Patients: EntrepreneurPulse.com.au  Fact Sheet for Healthcare Providers: IncredibleEmployment.be  This test is not yet approved or cleared by the [place] FDA and has been authorized for detection and/or diagnosis of SARS-CoV-2 by FDA under an Emergency Use Authorization (EUA). This EUA will remain in  effect (meaning this test can be used) for the duration of the COVID-19 declaration under Section 564(b)(1) of the Act, 21 U.S.C. section 360bbb-3(b)(1), unless the  authorization is terminated or revoked.  Performed at [facility], [place] [address], [place], [place] [address]          Radiology Studies: No results found.      Scheduled Meds: . [MAR Hold] allopurinol  100 mg Oral Daily  . [MAR Hold] carvedilol  12.5 mg Oral BID WC  . [MAR Hold] cinacalcet  30 mg Oral Q supper  . [MAR Hold] doxycycline  100 mg Oral Q12H  . [MAR Hold] insulin aspart  0-20 Units Subcutaneous TID WC  . [MAR Hold] insulin aspart  0-5 Units Subcutaneous QHS  . [MAR Hold] levothyroxine  150 mcg Oral Q0600  . [MAR Hold] metolazone  5 mg Oral Q M,W,F  . [MAR Hold] mirabegron ER  50 mg Oral Daily  . [MAR Hold] mometasone-formoterol  2 puff Inhalation BID  . [MAR Hold] oxyCODONE  5 mg Oral Once  . [MAR Hold] pantoprazole (PROTONIX) IV  40 mg Intravenous Q24H  . [MAR Hold] sodium chloride flush  3 mL Intravenous Q12H  . [MAR Hold] torsemide  100 mg Oral BID   Continuous Infusions:   Time spent: 26 minutes with over 50% of the time coordinating the patient's care    Harold Hedge, DO [facility]   Call night coverage person covering after 7pm

## 2021-01-26 ENCOUNTER — Encounter (HOSPITAL_COMMUNITY): Payer: Self-pay | Admitting: Gastroenterology

## 2021-01-26 DIAGNOSIS — D509 Iron deficiency anemia, unspecified: Secondary | ICD-10-CM | POA: Diagnosis not present

## 2021-01-26 DIAGNOSIS — D649 Anemia, unspecified: Secondary | ICD-10-CM | POA: Diagnosis not present

## 2021-01-26 LAB — CBC
HCT: 27.8 % — ABNORMAL LOW (ref 36.0–46.0)
Hemoglobin: 8.4 g/dL — ABNORMAL LOW (ref 12.0–15.0)
MCH: 24.9 pg — ABNORMAL LOW (ref 26.0–34.0)
MCHC: 30.2 g/dL (ref 30.0–36.0)
MCV: 82.2 fL (ref 80.0–100.0)
Platelets: 418 10*3/uL — ABNORMAL HIGH (ref 150–400)
RBC: 3.38 MIL/uL — ABNORMAL LOW (ref 3.87–5.11)
RDW: 18.1 % — ABNORMAL HIGH (ref 11.5–15.5)
WBC: 10.3 10*3/uL (ref 4.0–10.5)
nRBC: 0 % (ref 0.0–0.2)

## 2021-01-26 LAB — BASIC METABOLIC PANEL
Anion gap: 15 (ref 5–15)
BUN: 160 mg/dL — ABNORMAL HIGH (ref 8–23)
CO2: 27 mmol/L (ref 22–32)
Calcium: 9.6 mg/dL (ref 8.9–10.3)
Chloride: 95 mmol/L — ABNORMAL LOW (ref 98–111)
Creatinine, Ser: 3.1 mg/dL — ABNORMAL HIGH (ref 0.44–1.00)
GFR, Estimated: 16 mL/min — ABNORMAL LOW (ref >60)
Glucose, Bld: 208 mg/dL — ABNORMAL HIGH (ref 70–99)
Potassium: 3.4 mmol/L — ABNORMAL LOW (ref 3.5–5.1)
Sodium: 137 mmol/L (ref 135–145)

## 2021-01-26 LAB — GLUCOSE, CAPILLARY
Glucose-Capillary: 204 mg/dL — ABNORMAL HIGH (ref 70–99)
Glucose-Capillary: 246 mg/dL — ABNORMAL HIGH (ref 70–99)
Glucose-Capillary: 206 mg/dL — ABNORMAL HIGH (ref 70–99)
Glucose-Capillary: 152 mg/dL — ABNORMAL HIGH (ref 70–99)

## 2021-01-26 LAB — MAGNESIUM: Magnesium: 1.9 mg/dL (ref 1.7–2.4)

## 2021-01-26 MED ORDER — INSULIN GLARGINE 100 UNIT/ML ~~LOC~~ SOLN
10.0000 [IU] | Freq: Every day | SUBCUTANEOUS | Status: DC
  Administered 2021-01-26 – 2021-01-31 (×6): 10 [IU] via SUBCUTANEOUS
  Filled 2021-01-26 (×6): qty 0.1

## 2021-01-26 MED ORDER — PANTOPRAZOLE SODIUM 40 MG PO TBEC: 40.0000 mg | DELAYED_RELEASE_TABLET | Freq: Two times a day (BID) | ORAL | 1 refills | Status: DC

## 2021-01-26 MED ORDER — POTASSIUM CHLORIDE CRYS ER 20 MEQ PO TBCR
20.0000 meq | EXTENDED_RELEASE_TABLET | Freq: Once | ORAL | Status: AC
  Administered 2021-01-26: 20 meq via ORAL
  Filled 2021-01-26: qty 1

## 2021-01-26 MED ORDER — DOXYCYCLINE HYCLATE 100 MG PO TABS: 100.0000 mg | ORAL_TABLET | Freq: Two times a day (BID) | ORAL | 0 refills | Status: DC

## 2021-01-26 NOTE — Discharge Summary (Signed)
Physician Discharge Summary  [name] [name] RWE:[id number] DOB: [birth year]   PCP: [name], MD  Admit date: 01/24/2021 Discharge date: 01/31/2021 Length of Stay: 0 days   Code Status: Full Code  Admitted From:  Home Discharged to:   SNF Discharge Condition:  Stable  Recommendations for Outpatient Follow-up   1. Follow up with GI. She will likely need an oral contrast CT as an outpatient to continue her GI work up and then consider capsule endoscopy or colonoscopy if she continues to have signs of iron deficiency or GI blood loss 2. Increased protonix to twice daily for 2 months then back to once daily  Hospital Summary  This is a 70 year old female with a history of CKD 5 (not on HD, follows with Dr. [name]), chronic combined systolic and diastolic CHF, COPD, GERD, IDDM, HTN, chronic iron deficiency anemia and anemia of CKD who gets regular IV iron infusions and EPO injections who was sent to the ED on 4/19 from the [place] office due to concern for GI bleeding and symptomatic anemia (Hb 7.5) with a syncopal episode several weeks ago and fatigue. She also reported a left buttock boil that recently ruptured.   ED Course:Afebrile, hypertensive, in no distress. +FOBT without gross bleeding. Hgb 7.7g/dl. Metabolic panel shows hypokalemia and derangements near the patient's baseline including BUN 168, Cr 3.03. Admission requested, GI consulted.She received 1 u PRBCs upon admission due to symptomatic anemia. Started on doxycycline for her left buttock boil.  4/20 EGD: Erosive gastritis with flecks of old blood that look like cameron erosions though no significant hiatal hernia. Normal ampulla, duodenal bulb, first and second portion of the duodenum, major papilla, area of the papilla and third portion of the duodenum. Hematin in the stomach and chronic gastritis otherwise normal. No specimens collected.  Initially planned for DC on 4/21, however DC was delayed as PT recommended SNF. She  also completed a course of Doxycycline for boil concerning for mild cellulitis.  4/25: patient due for outpatient IV Feraheme infusion but since she was hospitalized this was given here.   Discharged in stable condition without recurrence of bleeding after restarting plavix and tolerated her diet. Per GI: continue protonix 40 mg BID x 2 months then back to QD. Will need outpatient follow up with GI.  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 Acute on chronic symptomatic anemia suspected secondary to GI bleed with history of anemia of CKD and iron deficiency anemia s/p 1 unit PRBCs, stable a. Received EPO outpatient 4/18 b. Received Feraheme infusion 4/25 as she was due for her outpatient infusion c. Plavix restarted 4/23 and hemoglobin remains stable d. Protonix 40 mg BID x 2 months then back to QD   2. RLE swelling a. Not noted on prior exams but patient reports this is a chronic issue and even says that it looks improved b. Not on pharmacologic anticoagulation due to #1 - > D dimer 2.0 -> Bilateral LE  [name] negative for DVT  3. Cellulitis of buttock, resolved  a. Completed 6 days doxycycline  4. Leg cramping/right shoulder pain, likely musculoskeletal, resolved a. Using home topical spray b. On percocet and oxycodone  5. Hypokalemia, resolved  6. Hypomagnesemia, resolved  7. Chronic combined systolic/diastolic CHF, not in acute exacerbation a. Continue home torsemide, metolazone MWF and  coreg  8. COPD, not in acute exacerbation a. Continue Dulera  9. Constipation a. No BM with senna and MiraLAX b. BM following glycerin suppository and SMOG enema c. Continue bowel regimen at SNF  10. IDDM a. HbA1c 6.5 in March b. Continue home regimen at DC  11. Peripheral vascular disease a. plavix as above  12. Chronic pain a. continue home meds and bowel regimen  13. Hypothyroidism a. Continue synthroid  14. Recent syncopal episode  Generalized weakness a. Occurred a few weeks ago and sounds like a vasovagal episode at home. Had unremarkable head imaging at the time b. Has bilateral knee ecchymosis and resolving left black eye c. PT recommending SNF  15. Ingrown hair of the vagina a. Warm compresses PRN     Consultants  . GI  Procedures  . 4/20 EGD   Antibiotics   Anti-infectives (From admission, onward)   Start     Dose/Rate Route Frequency Ordered Stop   01/26/21 0000  doxycycline (VIBRA-TABS) 100 MG tablet  Status:  Discontinued        100 mg Oral Every 12 hours 01/26/21 1224 01/31/21    01/24/21 2200  doxycycline (VIBRA-TABS) tablet 100 mg  Status:  Discontinued        100 mg Oral Every 12 hours 01/24/21 1703 01/30/21 1453       Subjective  Patient seen and examined at bedside no acute distress and resting comfortably.  No events overnight.  Tolerating diet. In good spirits and anticipating discharge.   Denies any chest pain, shortness of breath, fever, nausea, vomiting, urinary or bowel complaints. Otherwise ROS negative    Objective   Discharge Exam: Vitals:   01/31/21 0413 01/31/21 0752  BP: 121/73   Pulse: 81   Resp: 16   Temp: 98 F (36.7 C)   SpO2: 96% 99%   Vitals:   01/30/21 1905 01/30/21 2149 01/31/21 0413 01/31/21 0752  BP:  126/61 121/73   Pulse:  84 81   Resp:  19 16   Temp:  98.2 F (36.8 C) 98 F (36.7 C)   TempSrc:  Oral Oral   SpO2: 99% 97% 96% 99%  Weight:      Height:        Physical Exam Vitals and  nursing note reviewed.  Constitutional:      Appearance: Normal appearance.  HENT:     Head: Normocephalic and atraumatic.  Eyes:     Conjunctiva/sclera: Conjunctivae normal.  Cardiovascular:     Rate and Rhythm: Normal rate and regular rhythm.  Pulmonary:     Effort: Pulmonary effort is normal.     Breath sounds: Normal breath sounds.  Abdominal:     General: Abdomen is flat.     Palpations: Abdomen is soft.  Musculoskeletal:        General: No swelling or tenderness.  Skin:    Coloration: Skin is not jaundiced or pale.  Neurological:     Mental Status: She is alert. Mental status is at baseline.  Psychiatric:        Mood and Affect: Mood normal.        Behavior:  Behavior normal.       The results of significant diagnostics from this hospitalization (including imaging, microbiology, ancillary and laboratory) are listed below for reference.     Microbiology: Recent Results (from the past 240 hour(s))  Resp Panel by RT-PCR (Flu A&B, Covid) Nasopharyngeal Swab     Status: None   Collection Time: 01/24/21  2:27 PM   Specimen: Nasopharyngeal Swab; Nasopharyngeal(NP) swabs in vial transport medium  Result Value Ref Range Status   SARS Coronavirus 2 by RT PCR NEGATIVE NEGATIVE Final    Comment: (NOTE) SARS-CoV-2 target nucleic acids are NOT DETECTED.  The SARS-CoV-2 RNA is generally detectable in upper respiratory specimens during the acute phase of infection. The lowest concentration of SARS-CoV-2 viral copies this assay can detect is 138 copies/mL. A negative result does not preclude SARS-Cov-2 infection and should not be used as the sole basis for treatment or other patient management decisions. A negative result may occur with  improper specimen collection/handling, submission of specimen other than nasopharyngeal swab, presence of viral mutation(s) within the areas targeted by this assay, and inadequate number of viral copies(<138 copies/mL). A negative result must  be combined with clinical observations, patient history, and epidemiological information. The expected result is Negative.  Fact Sheet for Patients:  EntrepreneurPulse.com.au  Fact Sheet for Healthcare Providers:  IncredibleEmployment.be  This test is no t yet approved or cleared by the [place] FDA and  has been authorized for detection and/or diagnosis of SARS-CoV-2 by FDA under an Emergency Use Authorization (EUA). This EUA will remain  in effect (meaning this test can be used) for the duration of the COVID-19 declaration under Section 564(b)(1) of the Act, 21 U.S.C.section 360bbb-3(b)(1), unless the authorization is terminated  or revoked sooner.       Influenza A by PCR NEGATIVE NEGATIVE Final   Influenza B by PCR NEGATIVE NEGATIVE Final    Comment: (NOTE) The Xpert Xpress SARS-CoV-2/FLU/RSV plus assay is intended as an aid in the diagnosis of influenza from Nasopharyngeal swab specimens and should not be used as a sole basis for treatment. Nasal washings and aspirates are unacceptable for Xpert Xpress SARS-CoV-2/FLU/RSV testing.  Fact Sheet for Patients: EntrepreneurPulse.com.au  Fact Sheet for Healthcare Providers: IncredibleEmployment.be  This test is not yet approved or cleared by the [place] FDA and has been authorized for detection and/or diagnosis of SARS-CoV-2 by FDA under an Emergency Use Authorization (EUA). This EUA will remain in effect (meaning this test can be used) for the duration of the COVID-19 declaration under Section 564(b)(1) of the Act, 21 U.S.C. section 360bbb-3(b)(1), unless the authorization is terminated or revoked.  Performed at [facility], [place] [address], [place], [place] [address]      Labs: BNP (last 3 results) Recent Labs    01/05/21 1646  BNP 767.3*   Basic Metabolic Panel: Recent Labs  Lab 01/24/21 1426 01/25/21 0540  01/26/21 0528  NA 135 132* 137  K 2.9* 3.3* 3.4*  CL 92* 91* 95*  CO2 29 26 27   GLUCOSE 148* 166* 208*  BUN 168* 172* 160*  CREATININE 3.03* 3.12* 3.10*  CALCIUM 9.9 9.4 9.6  MG  --  1.5* 1.9   Liver Function Tests: Recent Labs  Lab 01/24/21 1426  AST 21  ALT 18  ALKPHOS 61  BILITOT 0.6  PROT 7.3  ALBUMIN 3.2*   No results for input(s): LIPASE, AMYLASE in the last 168 hours. No results for input(s): AMMONIA in the last 168 hours. CBC:  Recent Labs  Lab 01/24/21 1426 01/25/21 0540 01/26/21 0528 01/28/21 0532 01/29/21 0539 01/30/21 0457  WBC 9.5 11.3* 10.3 10.0 10.7*  --   HGB 7.7* 7.8* 8.4* 8.3* 8.0* 8.5*  HCT 25.5* 25.1* 27.8* 27.7* 26.3* 28.3*  MCV 79.9* 80.7 82.2 82.7 80.4  --   PLT 395 362 418* 431* 441*  --    Cardiac Enzymes: No results for input(s): CKTOTAL, CKMB, CKMBINDEX, TROPONINI in the last 168 hours. BNP: Invalid input(s): POCBNP CBG: Recent Labs  Lab 01/30/21 1147 01/30/21 1627 01/30/21 2134 01/31/21 0732 01/31/21 1142  GLUCAP 271* 123* 123* 126* 189*   D-Dimer Recent Labs    01/30/21 1454  DDIMER 2.00*   Hgb A1c No results for input(s): HGBA1C in the last 72 hours. Lipid Profile No results for input(s): CHOL, HDL, LDLCALC, TRIG, CHOLHDL, LDLDIRECT in the last 72 hours. Thyroid function studies No results for input(s): TSH, T4TOTAL, T3FREE, THYROIDAB in the last 72 hours.  Invalid input(s): FREET3 Anemia work up No results for input(s): VITAMINB12, FOLATE, FERRITIN, TIBC, IRON, RETICCTPCT in the last 72 hours. Urinalysis    Component Value Date/Time   COLORURINE YELLOW 01/05/2021 1811   APPEARANCEUR HAZY (A) 01/05/2021 1811   LABSPEC 1.010 01/05/2021 1811   PHURINE 5.0 01/05/2021 1811   GLUCOSEU NEGATIVE 01/05/2021 1811   HGBUR NEGATIVE 01/05/2021 1811   BILIRUBINUR NEGATIVE 01/05/2021 1811   KETONESUR NEGATIVE 01/05/2021 1811   PROTEINUR NEGATIVE 01/05/2021 1811   UROBILINOGEN 0.2 08/04/2014 1409   NITRITE NEGATIVE  01/05/2021 1811   LEUKOCYTESUR NEGATIVE 01/05/2021 1811   Sepsis Labs Invalid input(s): PROCALCITONIN,  WBC,  LACTICIDVEN Microbiology Recent Results (from the past 240 hour(s))  Resp Panel by RT-PCR (Flu A&B, Covid) Nasopharyngeal Swab     Status: None   Collection Time: 01/24/21  2:27 PM   Specimen: Nasopharyngeal Swab; Nasopharyngeal(NP) swabs in vial transport medium  Result Value Ref Range Status   SARS Coronavirus 2 by RT PCR NEGATIVE NEGATIVE Final    Comment: (NOTE) SARS-CoV-2 target nucleic acids are NOT DETECTED.  The SARS-CoV-2 RNA is generally detectable in upper respiratory specimens during the acute phase of infection. The lowest concentration of SARS-CoV-2 viral copies this assay can detect is 138 copies/mL. A negative result does not preclude SARS-Cov-2 infection and should not be used as the sole basis for treatment or other patient management decisions. A negative result may occur with  improper specimen collection/handling, submission of specimen other than nasopharyngeal swab, presence of viral mutation(s) within the areas targeted by this assay, and inadequate number of viral copies(<138 copies/mL). A negative result must be combined with clinical observations, patient history, and epidemiological information. The expected result is Negative.  Fact Sheet for Patients:  EntrepreneurPulse.com.au  Fact Sheet for Healthcare Providers:  IncredibleEmployment.be  This test is no t yet approved or cleared by the [place] FDA and  has been authorized for detection and/or diagnosis of SARS-CoV-2 by FDA under an Emergency Use Authorization (EUA). This EUA will remain  in effect (meaning this test can be used) for the duration of the COVID-19 declaration under Section 564(b)(1) of the Act, 21 U.S.C.section 360bbb-3(b)(1), unless the authorization is terminated  or revoked sooner.       Influenza A by PCR NEGATIVE NEGATIVE  Final   Influenza B by PCR NEGATIVE NEGATIVE Final    Comment: (NOTE) The Xpert Xpress SARS-CoV-2/FLU/RSV plus assay is intended as an aid in the diagnosis of influenza from Nasopharyngeal swab specimens and should not be used  as a sole basis for treatment. Nasal washings and aspirates are unacceptable for Xpert Xpress SARS-CoV-2/FLU/RSV testing.  Fact Sheet for Patients: EntrepreneurPulse.com.au  Fact Sheet for Healthcare Providers: IncredibleEmployment.be  This test is not yet approved or cleared by the [place] FDA and has been authorized for detection and/or diagnosis of SARS-CoV-2 by FDA under an Emergency Use Authorization (EUA). This EUA will remain in effect (meaning this test can be used) for the duration of the COVID-19 declaration under Section 564(b)(1) of the Act, 21 U.S.C. section 360bbb-3(b)(1), unless the authorization is terminated or revoked.  Performed at [facility], [place] [address], [place], [place] [address]     Discharge Instructions     Discharge Instructions    Diet - low sodium heart healthy   Complete by: As directed    Discharge instructions   Complete by: As directed    You were seen in the hospital for suspected GI bleeding. You received a blood transfusion and underwent an endoscopy with GI.  Upon discharge: - Increase protonix to 40 mg twice daily for two months then back to once daily - Hold your Plavix for the next two days, restart on Sunday - Get lab work next week - Call the GI office if you notice any rectal bleeding - Follow up with GI in the office as needed  Continue your remaining medications. If you have any significant change or worsening of your symptoms do not hesitate to return to the ED.   Increase activity slowly   Complete by: As directed      Allergies as of 01/31/2021      Reactions   Nebivolol Swelling   Chest pain (reaction to Bystolic)   Ace  Inhibitors Swelling, Other (See Comments)   Tongue swell   Ace Inhibitors Swelling   Morphine And Related Itching   Morphine And Related Itching      Medication List    TAKE these medications   albuterol (2.5 MG/3ML) 0.083% nebulizer solution Commonly known as: PROVENTIL Take 2.5 mg by nebulization every 6 (six) hours as needed for wheezing or shortness of breath.   albuterol 108 (90 Base) MCG/ACT inhaler Commonly known as: VENTOLIN HFA Inhale 2 puffs into the lungs 4 (four) times daily as needed for wheezing or shortness of breath.   allopurinol 100 MG tablet Commonly known as: ZYLOPRIM Take 100 mg by mouth daily.   budesonide-formoterol 160-4.5 MCG/ACT inhaler Commonly known as: SYMBICORT Inhale 2 puffs into the lungs 2 (two) times daily as needed (for SOB).   carboxymethylcellulose 0.5 % Soln Commonly known as: REFRESH PLUS Place 1 drop into both eyes daily as needed (dry eyes).   carvedilol 12.5 MG tablet Commonly known as: COREG Take 12.5 mg by mouth 2 (two) times daily.   cholecalciferol 25 MCG (1000 UNIT) tablet Commonly known as: VITAMIN D3 Take 1,000 Units by mouth daily.   cinacalcet 30 MG tablet Commonly known as: SENSIPAR Take 30 mg by mouth daily.   clopidogrel 75 MG tablet Commonly known as: PLAVIX TAKE 1 TABLET BY MOUTH EVERY DAY   diclofenac Sodium 1 % Gel Commonly known as: VOLTAREN Apply 1 application topically 4 (four) times daily as needed (pain).   diphenhydrAMINE 25 MG tablet Commonly known as: BENADRYL Take 25 mg by mouth every 6 (six) hours as needed for allergies.   fluticasone 50 MCG/ACT nasal spray Commonly known as: FLONASE Place 2 sprays into the nose as needed for allergies.   hydrOXYzine 25 MG  tablet Commonly known as: ATARAX/VISTARIL Take 25 mg by mouth 2 (two) times daily as needed for anxiety.   insulin lispro protamine-lispro (75-25) 100 UNIT/ML Susp injection Commonly known as: HUMALOG 75/25 MIX Inject 30-40 Units  into the skin See admin instructions. Inject 40 units into the skin morning and 30 units in the evening   levothyroxine 150 MCG tablet Commonly known as: SYNTHROID Take 150 mcg by mouth daily.   magnesium oxide 400 (241.3 Mg) MG tablet Commonly known as: MAG-OX Take 1 tablet (400 mg total) by mouth 2 (two) times daily.   metoCLOPramide 5 MG tablet Commonly known as: REGLAN Take 5 mg by mouth 3 (three) times daily.   metolazone 5 MG tablet Commonly known as: ZAROXOLYN Take 5 mg by mouth See admin instructions. Takes 1 tablet on Monday, Wednesday and Friday   multivitamin with minerals Tabs tablet Take 1 tablet by mouth daily.   Myrbetriq 50 MG Tb24 tablet Generic drug: mirabegron ER Take 50 mg by mouth daily.   oxyCODONE-acetaminophen 10-325 MG tablet Commonly known as: PERCOCET Take 1 tablet by mouth every 6 (six) hours as needed for pain. What changed: when to take this   pantoprazole 40 MG tablet Commonly known as: PROTONIX Take 1 tablet (40 mg total) by mouth 2 (two) times daily before a meal for 60 days, THEN 1 tablet (40 mg total) daily. Start taking on: January 31, 2021 What changed: See the new instructions.   polyvinyl alcohol 1.4 % ophthalmic solution Commonly known as: LIQUIFILM TEARS Place 2 drops into both eyes daily as needed for dry eyes.   Potassium Chloride ER 20 MEQ Tbcr Take 1 tablet by mouth 3 (three) times a week. Tuesday, Thursdays and Saturdays   torsemide 100 MG tablet Commonly known as: DEMADEX Take 100 mg by mouth 2 (two) times daily.   Trulicity 1.5 QJ/3.3LK Sopn Generic drug: Dulaglutide Inject 1.5 mg into the skin every Monday.   Trulicity 1.5 TG/2.5WL Sopn Generic drug: Dulaglutide Inject 1.5 mg into the skin once a week. Monday   VITAMIN B COMPLEX PO Take 1 capsule by mouth daily.       Follow-up Information    Clarene Essex, MD. Schedule an appointment as soon as possible for a visit in 1 month(s).   Specialty:  Gastroenterology Contact information: [address] [place] [phone]              Allergies  Allergen Reactions  . Nebivolol Swelling    Chest pain (reaction to Bystolic)  . Ace Inhibitors Swelling and Other (See Comments)    Tongue swell  . Ace Inhibitors Swelling  . Morphine And Related Itching  . Morphine And Related Itching    Dispo: The patient is from: Home              Anticipated d/c is to: SNF              Patient currently is medically stable to d/c.       Time coordinating discharge: Over 30 minutes   SIGNED:   Harold Hedge, D.O. [facility] Pager: [phone]  01/31/2021, 12:51 PM

## 2021-01-26 NOTE — Progress Notes (Addendum)
PROGRESS NOTE    [name] [name]    Code Status: Full Code  SWF:[id number] DOB: [birth year] DOA: 01/24/2021 LOS: 0 days  PCP: [name], MD CC:  Chief Complaint  Patient presents with  . Rectal Bleeding  . Abnormal Lab       Hospital Summary   This is a 70 year old female with a history of CKD 5 (not on HD, follows with Dr. [name]), chronic combined systolic and diastolic CHF, COPD, GERD, IDDM, HTN, chronic iron deficiency anemia and anemia of CKD who gets regular IV iron infusions and EPO injections who was sent to the ED on 4/19 from the [place] office due to concern for GI bleeding and symptomatic anemia (Hb 7.5) with a syncopal episode several weeks ago and fatigue. She also reported a left buttock boil that recently ruptured.   ED Course: Afebrile, hypertensive, in no distress. +FOBT without gross bleeding. Hgb 7.7g/dl. Metabolic panel shows hypokalemia and derangements near the patient's baseline including BUN 168, Cr 3.03. Admission requested, GI consulted. She received 1 u PRBCs upon admission due to symptomatic anemia.   4/20 EGD: Erosive gastritis with flecks of old blood that look like cameron erosions though no significant hiatal hernia. Normal ampulla, duodenal bulb, first and second portion of the duodenum, major papilla, area of the papilla and third portion of the duodenum. Hematin in the stomach and chronic gastritis otherwise normal. No specimens collected.  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 Acute on chronic symptomatic anemia suspected secondary to GI bleed with history of anemia of CKD and iron deficiency anemia a. Hemodynamically stable on room air b. HB 7.5 on admission and currently 7.8 despite 1 u PRBCs c. Received EPO 4/18 and due for IV iron on 4/25 as scheduled d. EGD 4/20: erosive gastritis e. Hold plavix x 2 more days f. Protonix 40 mg BID x 2 months then back to QD  2. Cellulitis of buttock a. Afebrile and resolved leukocytosis b. Continue doxycycline Day 3/5  3. Leg cramping/right shoulder pain, likely musculoskeletal, resolved a. Using home topical spray b. On percocet and oxycodone  4. Hypokalemia a. Replete PO  5. Hypomagnesemia, resolved  6. Chronic combined systolic/diastolic CHF, not in acute exacerbation a. Continue home torsemide, metolazone MWF and coreg  7. COPD, not in acute exacerbation a. Continue Dulera  8. IDDM a. Holding insulin 75/25 b. Continue HS and sliding scale add on Lantus and carb modified diet  9. Peripheral vascular disease a. Holding plavix x 2 more days  10. Chronic pain a. continue home meds  11. Hypothyroidism a. Continue synthroid  12. Recent syncopal episode  Generalized weakness a. Occurred a few weeks ago and sounds like a vasovagal episode at home. Had unremarkable head imaging at the time b. Has bilateral knee ecchymosis and left black eye c. PT recommending SNF  DVT prophylaxis: SCDs Start: 01/24/21 1744   Family Communication: patient updated  Disposition  Plan:  Status is: Observation  The patient remains OBS appropriate and will d/c before 2 midnights.  Dispo: The patient is from: Home              Anticipated d/c is to: SNF              Patient currently is medically stable to d/c.   Difficult to place patient No                 Pressure injury documentation    None  Consultants  GI  Procedures  4/20: EGD  Antibiotics    Anti-infectives (From admission, onward)   Start     Dose/Rate Route Frequency Ordered Stop   01/26/21 0000  doxycycline (VIBRA-TABS) 100 MG tablet        100 mg Oral Every 12 hours 01/26/21 1224 01/29/21 2359   01/24/21 2200  doxycycline (VIBRA-TABS) tablet 100 mg        100 mg Oral Every 12 hours 01/24/21 1703          Subjective   Feeling better and no recurrent bleeding. Tolerating her diet. No issues overnight and no recurrent muscle cramps.   Objective   Vitals:   01/26/21 0433 01/26/21 0753 01/26/21 0846 01/26/21 1326  BP: (!) 146/79  (!) 150/81 134/73  Pulse: 84  88 79  Resp: 18  17 16   Temp: 98.5 F (36.9 C)  98.4 F (36.9 C) 98.1 F (36.7 C)  TempSrc: Oral  Oral Oral  SpO2: 100% 96% 100% 97%  Weight:      Height:        Intake/Output Summary (Last 24 hours) at 01/26/2021 1419 Last data filed at 01/26/2021 0549 Gross per 24 hour  Intake 490 ml  Output 1500 ml  Net -1010 ml   Filed Weights   01/24/21 1223 01/24/21 1818  Weight: 90.7 kg 96 kg    Examination:  Physical Exam Vitals and nursing note reviewed.  Constitutional:      Appearance: Normal appearance.  HENT:     Head: Normocephalic and atraumatic.  Eyes:     Conjunctiva/sclera: Conjunctivae normal.  Cardiovascular:     Rate and Rhythm: Normal rate and regular rhythm.  Pulmonary:     Effort: Pulmonary effort is normal.     Breath sounds: Normal breath sounds.  Abdominal:     General: Abdomen is flat.     Palpations: Abdomen is soft.  Musculoskeletal:        General: No swelling.  Skin:    Coloration: Skin is not jaundiced or pale.  Neurological:     Mental Status: She is alert. Mental status is at baseline.  Psychiatric:        Mood and Affect: Mood normal.        Behavior: Behavior normal.     Data Reviewed: I have personally reviewed following labs and imaging studies  CBC: Recent Labs  Lab 01/23/21 1049 01/24/21 1426 01/25/21 0540 01/26/21 0528  WBC  --  9.5 11.3*  10.3  HGB 7.5* 7.7* 7.8* 8.4*  HCT  --  25.5* 25.1* 27.8*  MCV  --  79.9* 80.7 82.2  PLT  --  395 362 010*   Basic Metabolic Panel: Recent Labs  Lab 01/24/21 1426 01/25/21 0540 01/26/21 0528  NA 135 132* 137  K 2.9* 3.3* 3.4*  CL 92* 91* 95*  CO2 29 26 27   GLUCOSE 148* 166* 208*  BUN 168* 172* 160*  CREATININE 3.03*  3.12* 3.10*  CALCIUM 9.9 9.4 9.6  MG  --  1.5* 1.9   GFR: Estimated Creatinine Clearance: 18.3 mL/min (A) (by C-G formula based on SCr of 3.1 mg/dL (H)). Liver Function Tests: Recent Labs  Lab 01/24/21 1426  AST 21  ALT 18  ALKPHOS 61  BILITOT 0.6  PROT 7.3  ALBUMIN 3.2*   No results for input(s): LIPASE, AMYLASE in the last 168 hours. No results for input(s): AMMONIA in the last 168 hours. Coagulation Profile: No results for input(s): INR, PROTIME in the last 168 hours. Cardiac Enzymes: No results for input(s): CKTOTAL, CKMB, CKMBINDEX, TROPONINI in the last 168 hours. BNP (last 3 results) No results for input(s): PROBNP in the last 8760 hours. HbA1C: No results for input(s): HGBA1C in the last 72 hours. CBG: Recent Labs  Lab 01/25/21 1154 01/25/21 1736 01/25/21 2111 01/26/21 0800 01/26/21 1205  GLUCAP 149* 147* 201* 204* 246*   Lipid Profile: No results for input(s): CHOL, HDL, LDLCALC, TRIG, CHOLHDL, LDLDIRECT in the last 72 hours. Thyroid Function Tests: No results for input(s): TSH, T4TOTAL, FREET4, T3FREE, THYROIDAB in the last 72 hours. Anemia Panel: No results for input(s): VITAMINB12, FOLATE, FERRITIN, TIBC, IRON, RETICCTPCT in the last 72 hours. Sepsis Labs: No results for input(s): PROCALCITON, LATICACIDVEN in the last 168 hours.  Recent Results (from the past 240 hour(s))  Resp Panel by RT-PCR (Flu A&B, Covid) Nasopharyngeal Swab     Status: None   Collection Time: 01/24/21  2:27 PM   Specimen: Nasopharyngeal Swab; Nasopharyngeal(NP) swabs in vial transport medium  Result Value Ref Range Status   SARS Coronavirus 2 by RT PCR  NEGATIVE NEGATIVE Final    Comment: (NOTE) SARS-CoV-2 target nucleic acids are NOT DETECTED.  The SARS-CoV-2 RNA is generally detectable in upper respiratory specimens during the acute phase of infection. The lowest concentration of SARS-CoV-2 viral copies this assay can detect is 138 copies/mL. A negative result does not preclude SARS-Cov-2 infection and should not be used as the sole basis for treatment or other patient management decisions. A negative result may occur with  improper specimen collection/handling, submission of specimen other than nasopharyngeal swab, presence of viral mutation(s) within the areas targeted by this assay, and inadequate number of viral copies(<138 copies/mL). A negative result must be combined with clinical observations, patient history, and epidemiological information. The expected result is Negative.  Fact Sheet for Patients:  EntrepreneurPulse.com.au  Fact Sheet for Healthcare Providers:  IncredibleEmployment.be  This test is no t yet approved or cleared by the [place] FDA and  has been authorized for detection and/or diagnosis of SARS-CoV-2 by FDA under an Emergency Use Authorization (EUA). This EUA will remain  in effect (meaning this test can be used) for the duration of the COVID-19 declaration under Section 564(b)(1) of the Act, 21 U.S.C.section 360bbb-3(b)(1), unless the authorization is terminated  or revoked sooner.       Influenza A by PCR NEGATIVE NEGATIVE Final   Influenza B by PCR NEGATIVE NEGATIVE Final    Comment: (NOTE) The Xpert Xpress SARS-CoV-2/FLU/RSV plus assay is intended as an aid in the diagnosis of influenza from Nasopharyngeal swab specimens and should not be used as a sole basis for treatment. Nasal washings and aspirates are unacceptable for Xpert Xpress SARS-CoV-2/FLU/RSV testing.  Fact Sheet for Patients: EntrepreneurPulse.com.au  Fact Sheet for  Healthcare Providers: IncredibleEmployment.be  This test is not yet approved or cleared by the [place] FDA and has been authorized for detection and/or diagnosis of SARS-CoV-2  by FDA under an Emergency Use Authorization (EUA). This EUA will remain in effect (meaning this test can be used) for the duration of the COVID-19 declaration under Section 564(b)(1) of the Act, 21 U.S.C. section 360bbb-3(b)(1), unless the authorization is terminated or revoked.  Performed at [facility], [place] [address], [place], [place] [address]          Radiology Studies: No results found.      Scheduled Meds: . allopurinol  100 mg Oral Daily  . carvedilol  12.5 mg Oral BID WC  . cinacalcet  30 mg Oral Q supper  . doxycycline  100 mg Oral Q12H  . insulin aspart  0-20 Units Subcutaneous TID WC  . insulin aspart  0-5 Units Subcutaneous QHS  . insulin glargine  10 Units Subcutaneous Daily  . levothyroxine  150 mcg Oral Q0600  . metolazone  5 mg Oral Q M,W,F  . mirabegron ER  50 mg Oral Daily  . mometasone-formoterol  2 puff Inhalation BID  . pantoprazole (PROTONIX) IV  40 mg Intravenous Q24H  . potassium chloride  20 mEq Oral Once  . sodium chloride flush  3 mL Intravenous Q12H  . torsemide  100 mg Oral BID   Continuous Infusions:   Time spent: 25 minutes with over 50% of the time coordinating the patient's care    Harold Hedge, DO [facility]   Call night coverage person covering after 7pm

## 2021-01-26 NOTE — Addendum Note (Signed)
Addendum  created 01/26/21 0957 by Janeece Riggers, MD   Attestation recorded in [place], [place] accepted, Intraprocedure Attestations deleted, [place] filed

## 2021-01-26 NOTE — Evaluation (Addendum)
Physical Therapy Evaluation Patient Details Name: [name] [name] MRN: [id number] DOB: 09-Dec-1950 Today's Date: 01/26/2021   History of Present Illness  [name] [name] is a 70 y.o. female was sent to the ED from [facility] office due to GI bleeding and decreased hemoglobin to 7.5. In ED, hemoglobin 7.7 and +FOBT. PMH: CKD with AVF not on HD, CHF, COPD, GERD, diabetes, HTN, TIA, and chrnoic anemia of iron deficiency and of CKD on IV iron infusions and EPO injections regularly with history of transfusion required    Clinical Impression  Pt admitted with above diagnosis. At baseline, pt ambulates household distances with RW, has aide 3 hrs/day to assist with bathing, dressing, and completes all household chores, lives with son who drives truck during the day. Pt currently requiring min A to power up to stand and steady with very limited ambulation at bedside due to R foot tenderness/pain limited weight-shift to R enough to lift L foot. Recommending SNF for short term strengthening prior to d/c home due to current functional status, limited ambulation and limited assistance at home. Pt currently with functional limitations due to the deficits listed below (see PT Problem List). Pt will benefit from skilled PT to increase their independence and safety with mobility to allow discharge to the venue listed below.       Follow Up Recommendations SNF    Equipment Recommendations  None recommended by PT    Recommendations for Other Services       Precautions / Restrictions Precautions Precautions: Fall Precaution Comments: R foot tenderness Restrictions Weight Bearing Restrictions: No      Mobility  Bed Mobility  General bed mobility comments: sitting EOB eating breakfast upon arrival    Transfers Overall transfer level: Needs assistance Equipment used: Rolling walker (2 wheeled) Transfers: Sit to/from [facility] Sit to Stand: Min assist Stand pivot transfers: Min  assist  General transfer comment: min A to power up, cues for hand placement, slow to rise with hip extension last, decreased R weight-bearing due to pain/tenderness in foot  Ambulation/Gait Ambulation/Gait assistance: Min assist Gait Distance (Feet): 5 Feet Assistive device: Rolling walker (2 wheeled) Gait Pattern/deviations: Step-to pattern;Decreased stride length;Decreased weight shift to right;Trunk flexed;Wide base of support Gait velocity: decreased   General Gait Details: slow, limited steps with good R foot clearance and sliding L foot progression (able to march L leg in sitting but not standing), decreased weight-shift R, trunk flexed forward despite cues for upright posture, maintains wide BOS  Stairs            Wheelchair Mobility    Modified Rankin (Stroke Patients Only)       Balance Overall balance assessment: Needs assistance Sitting-balance support: Feet supported Sitting balance-Leahy Scale: Good Sitting balance - Comments: seated EOB   Standing balance support: During functional activity Standing balance-Leahy Scale: Poor Standing balance comment: reliant on UE support        Pertinent Vitals/Pain Pain Assessment: 0-10 Pain Score: 8  Pain Location: low back Pain Descriptors / Indicators: Throbbing;Aching;Sore Pain Intervention(s): Limited activity within patient's tolerance;Monitored during session;Repositioned    Home Living Family/patient expects to be discharged to:: Private residence Living Arrangements: Children (son) Available Help at Discharge: Family;Personal care attendant;Available PRN/intermittently Type of Home: Apartment Home Access: Stairs to enter Entrance Stairs-Rails: Right Entrance Stairs-Number of Steps: 3 Home Layout: One level Home Equipment: Walker - 2 wheels;Bedside commode;Tub bench;Cane - single point;Walker - 4 wheels Additional Comments: aide present 3 hrs/day for 7 days/wk  Prior Function Level of Independence:  Needs assistance   Gait / Transfers Assistance Needed: pt reports RW for household distances, borrows w/c from doctor's office for appointments  ADL's / Homemaking Assistance Needed: pt reports aide assists with bathing, dressing, householed chores; son able to assist at night as needed  Comments: Pt reports most recent fall ~3 weeks ago     Hand Dominance   Dominant Hand: Right    Extremity/Trunk Assessment   Upper Extremity Assessment Upper Extremity Assessment: Overall WFL for tasks assessed    Lower Extremity Assessment Lower Extremity Assessment: Generalized weakness;RLE deficits/detail;LLE deficits/detail RLE Deficits / Details: AROM WNL, hip and ankle 3+/5, unable to test knee flexion and extension due to pain throughout anterior leg, tender dorsum of R foot RLE Sensation: WNL LLE Deficits / Details: AROM WNL, hip knee and ankle 3+/5, tender dorsum of L foot    Cervical / Trunk Assessment Cervical / Trunk Assessment: Kyphotic  Communication   Communication: No difficulties  Cognition Arousal/Alertness: Awake/alert Behavior During Therapy: WFL for tasks assessed/performed Overall Cognitive Status: Within Functional Limits for tasks assessed       General Comments      Exercises     Assessment/Plan    PT Assessment Patient needs continued PT services  PT Problem List Decreased [name];Decreased range of motion;Decreased activity tolerance;Decreased balance;Decreased mobility;Cardiopulmonary status limiting activity;Obesity;Pain       PT Treatment Interventions DME instruction;Gait training;Functional mobility training;Therapeutic activities;Therapeutic exercise;Balance training;Patient/family education    PT Goals (Current goals can be found in the Care Plan section)  Acute Rehab PT Goals Patient Stated Goal: get stronger PT Goal Formulation: With patient Time For Goal Achievement: 02/09/21 Potential to Achieve Goals: Good    Frequency Min 3X/week    Barriers to discharge        Co-evaluation               AM-PAC PT "6 Clicks" Mobility  Outcome Measure Help needed turning from your back to your side while in a flat bed without using bedrails?: A Little Help needed moving from lying on your back to sitting on the side of a flat bed without using bedrails?: A Little Help needed moving to and from a bed to a chair (including a wheelchair)?: A Little Help needed standing up from a chair using your arms (e.g., wheelchair or bedside chair)?: A Little Help needed to walk in hospital room?: A Little Help needed climbing 3-5 steps with a railing? : A Lot 6 Click Score: 17    End of Session Equipment Utilized During Treatment: Gait belt Activity Tolerance: Patient limited by pain Patient left: in chair;with call bell/phone within reach;with chair alarm set;with nursing/sitter in room Nurse Communication: Mobility status PT Visit Diagnosis: Unsteadiness on feet (R26.81);Other abnormalities of gait and mobility (R26.89);Muscle weakness (generalized) (M62.81)    Time: 5053-9767 PT Time Calculation (min) (ACUTE ONLY): 33 min   Charges:   PT Evaluation $PT Eval Low Complexity: 1 Low PT Treatments $Therapeutic Activity: 8-22 mins         Tori Maizey Menendez PT, DPT 01/26/21, 12:41 PM

## 2021-01-26 NOTE — Addendum Note (Signed)
Addendum  created 01/26/21 0955 by Josephine Igo, MD   Attestation recorded in [place], [place] accepted, [place] Attestations filed

## 2021-01-26 NOTE — Progress Notes (Signed)
[name] [name] 11:36 AM  Subjective: Patient doing well without any new complaints and tolerating diet and no problems from her endoscopy yesterday and no signs of obvious bleeding no abdominal pain today patient does tell me she is refusing dialysis  Objective: Vital signs stable afebrile no acute distress abdomen is soft nontender hemoglobin stable BUN and creatinine essentially the same  Assessment: Multiple medical problems including iron deficiency in a patient on Plavix  Plan: Okay with me to go home probably proceed with an oral only CT scan as an outpatient to continue her GI work-up and then consider capsule endoscopy or colonoscopy if she continues to have signs of iron deficiency or GI blood loss  Rylynn Kobs E  office [phone] After 5PM or if no answer call [phone]

## 2021-01-27 DIAGNOSIS — D649 Anemia, unspecified: Secondary | ICD-10-CM | POA: Diagnosis not present

## 2021-01-27 DIAGNOSIS — D509 Iron deficiency anemia, unspecified: Secondary | ICD-10-CM | POA: Diagnosis not present

## 2021-01-27 LAB — GLUCOSE, CAPILLARY
Glucose-Capillary: 144 mg/dL — ABNORMAL HIGH (ref 70–99)
Glucose-Capillary: 228 mg/dL — ABNORMAL HIGH (ref 70–99)
Glucose-Capillary: 199 mg/dL — ABNORMAL HIGH (ref 70–99)
Glucose-Capillary: 193 mg/dL — ABNORMAL HIGH (ref 70–99)

## 2021-01-27 MED ORDER — POLYETHYLENE GLYCOL 3350 17 G PO PACK
17.0000 g | PACK | Freq: Every day | ORAL | Status: DC | PRN
  Administered 2021-01-28 – 2021-01-29 (×2): 17 g via ORAL
  Filled 2021-01-27 (×2): qty 1

## 2021-01-27 MED ORDER — PANTOPRAZOLE SODIUM 40 MG PO TBEC
40.0000 mg | DELAYED_RELEASE_TABLET | Freq: Two times a day (BID) | ORAL | Status: DC
  Administered 2021-01-27 – 2021-01-31 (×9): 40 mg via ORAL
  Filled 2021-01-27 (×9): qty 1

## 2021-01-27 MED ORDER — ALUM & MAG HYDROXIDE-SIMETH 200-200-20 MG/5ML PO SUSP
30.0000 mL | ORAL | Status: DC | PRN
Start: 2021-01-27 — End: 2021-01-31
  Administered 2021-01-27: 30 mL via ORAL
  Filled 2021-01-27: qty 30

## 2021-01-27 NOTE — Progress Notes (Signed)
The patient is receiving Protonix by the intravenous route.  Based on criteria approved by the Pharmacy and [place], the medication is being converted to the equivalent oral dose form.  These criteria include: -No active GI bleeding -Able to tolerate diet of full liquids (or better) or tube feeding -Able to tolerate other medications by the oral or enteral route  If you have any questions about this conversion, please contact the Pharmacy Department (phone [phone]).  Thank you.   Addendum: a. Increased to BID per MD note "Protonix 40 mg BID x 2 months then back to QD"

## 2021-01-27 NOTE — TOC Progression Note (Signed)
Transition of Care [facility]) - Progression Note    Patient Details  Name: [name] [name] MRN: [id number] Date of Birth: 1951-04-22  Transition of Care [facility]) CM/SW Contact  Ross Ludwig, [place] Phone Number: 01/27/2021, 5:22 PM  Clinical Narrative:     CSW began bed search process.  Patient will need insurance authorization, once patient has bed offers.  CSW to follow up with patient regarding bed offers for SNF.       Expected Discharge Plan and Services           Expected Discharge Date: 01/26/21                                     Social Determinants of Health (SDOH) Interventions    Readmission Risk Interventions Readmission Risk Prevention Plan 01/14/2020  Transportation Screening Complete  Medication Review Press photographer) Complete  PCP or Specialist appointment within 3-5 days of discharge Complete  HRI or Home Care Consult Complete  SW Recovery Care/Counseling Consult Complete  Palliative Care Screening Not [place] Complete  Some recent data might be hidden

## 2021-01-27 NOTE — Progress Notes (Addendum)
PROGRESS NOTE    KENIA TEAGARDEN    Code Status: Full Code  ACZ:[id number] DOB: [birth year] DOA: 01/24/2021 LOS: 0 days  PCP: [name], MD CC:  Chief Complaint  Patient presents with  . Rectal Bleeding  . Abnormal Lab       Hospital Summary   This is a 70 year old female with a history of CKD 5 (not on HD, follows with Dr. [name]), chronic combined systolic and diastolic CHF, COPD, GERD, IDDM, HTN, chronic iron deficiency anemia and anemia of CKD who gets regular IV iron infusions and EPO injections who was sent to the ED on 4/19 from the [place] office due to concern for GI bleeding and symptomatic anemia (Hb 7.5) with a syncopal episode several weeks ago and fatigue. She also reported a left buttock boil that recently ruptured.   ED Course: Afebrile, hypertensive, in no distress. +FOBT without gross bleeding. Hgb 7.7g/dl. Metabolic panel shows hypokalemia and derangements near the patient's baseline including BUN 168, Cr 3.03. Admission requested, GI consulted. She received 1 u PRBCs upon admission due to symptomatic anemia.   4/20 EGD: Erosive gastritis with flecks of old blood that look like cameron erosions though no significant hiatal hernia. Normal ampulla, duodenal bulb, first and second portion of the duodenum, major papilla, area of the papilla and third portion of the duodenum. Hematin in the stomach and chronic gastritis otherwise normal. No specimens collected.  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 Acute on chronic symptomatic anemia suspected secondary to GI bleed with history of anemia of CKD and iron deficiency anemia s/p 1 unit PRBCs a. Hemodynamically stable on room air b. Received EPO 4/18 and due for IV iron on 4/25 as scheduled c. EGD 4/20: erosive gastritis d. Hold plavix x 1 more days e. Protonix 40 mg BID x 2 months then back to QD  2. Cellulitis of buttock a. Afebrile and resolved leukocytosis b. Continue doxycycline Day 4/5  3. Leg cramping/right shoulder pain, likely musculoskeletal, resolved a. Using home topical spray b. On percocet and oxycodone  4. Hypokalemia a. Replete PO  5. Hypomagnesemia, resolved  6. Chronic combined systolic/diastolic CHF, not in acute exacerbation a. Continue home torsemide, metolazone MWF and coreg  7. COPD, not in acute exacerbation a. Continue Dulera  8. Constipation a. PRN miralax  9. IDDM a. Holding insulin 75/25 b. Continue HS and sliding scale add on Lantus and carb modified diet  10. Peripheral vascular disease a. Holding plavix x 2 more days  11. Chronic pain a. continue home meds  12. Hypothyroidism a. Continue synthroid  13. Recent syncopal episode  Generalized weakness a. Occurred a few weeks ago and sounds like a vasovagal episode at home. Had unremarkable head imaging at the time b. Has bilateral knee ecchymosis and left black eye c. PT recommending SNF  DVT prophylaxis: SCDs Start: 01/24/21 1744   Family Communication: No family at bedside  Disposition  Plan: Medically stable for discharge, currently waiting on SNF availability Status is: Observation  The patient remains OBS appropriate and will d/c before 2 midnights.  Dispo: The patient is from: Home              Anticipated d/c is to: SNF              Patient currently is medically stable to d/c.   Difficult to place patient No                 Pressure injury  documentation    None  Consultants  GI  Procedures  4/20: EGD 1 unit PRBCs  Antibiotics   Anti-infectives (From admission, onward)   Start     Dose/Rate Route Frequency Ordered Stop   01/26/21 0000  doxycycline (VIBRA-TABS) 100 MG tablet        100 mg Oral Every 12 hours 01/26/21 1224 01/29/21 2359   01/24/21 2200  doxycycline (VIBRA-TABS) tablet 100 mg        100 mg Oral Every 12 hours 01/24/21 1703          Subjective   Admits to constipation and otherwise no complaints.  She is hesitant to go to SNF as she spent a long period of time there last time she needed rehab however she knows this is necessary for her and is willing to go.  Objective   Vitals:   01/26/21 1918 01/26/21 2100 01/27/21 0442 01/27/21 0804  BP:  (!) 146/75 140/67   Pulse:  89 78   Resp:  18 20   Temp:  98.5 F (36.9 C) 97.9 F (36.6 C)   TempSrc:  Oral Oral   SpO2: 96% 98% 99% 98%  Weight:      Height:        Intake/Output Summary (Last 24 hours) at 01/27/2021 1211 Last data filed at 01/27/2021 0902 Gross per 24 hour  Intake 120 ml  Output 1000 ml  Net -880 ml   Filed Weights   01/24/21 1223 01/24/21 1818  Weight: 90.7 kg 96 kg    Examination:  Physical Exam Vitals and nursing note reviewed.  Constitutional:      Appearance: Normal appearance.  HENT:     Head: Normocephalic and atraumatic.  Eyes:     Conjunctiva/sclera: Conjunctivae normal.  Cardiovascular:     Rate and Rhythm: Normal rate and regular rhythm.  Pulmonary:     Effort: Pulmonary effort is normal.     Breath sounds: Normal breath sounds.  Abdominal:     General: Abdomen is flat.     Palpations: Abdomen is soft.  Musculoskeletal:        General: No swelling or tenderness.  Skin:    Coloration: Skin is not jaundiced or pale.  Neurological:     Mental Status: She is alert. Mental status is at baseline.  Psychiatric:        Mood and Affect: Mood normal.        Behavior: Behavior normal.     Data  Reviewed: I have personally reviewed following labs and imaging studies  CBC: Recent Labs  Lab 01/23/21 1049 01/24/21 1426 01/25/21 0540 01/26/21 0528  WBC  --  9.5 11.3* 10.3  HGB 7.5* 7.7* 7.8* 8.4*  HCT  --  25.5* 25.1* 27.8*  MCV  --  79.9* 80.7 82.2  PLT  --  395 362 935*   Basic Metabolic Panel: Recent Labs  Lab 01/24/21 1426 01/25/21 0540 01/26/21 0528  NA 135 132* 137  K 2.9* 3.3* 3.4*  CL 92* 91* 95*  CO2 29 26 27   GLUCOSE 148* 166* 208*  BUN 168* 172* 160*  CREATININE 3.03* 3.12* 3.10*  CALCIUM 9.9 9.4 9.6  MG  --  1.5* 1.9   GFR: Estimated Creatinine Clearance: 18.3 mL/min (A) (by C-G formula based on SCr of 3.1 mg/dL (H)). Liver Function Tests: Recent Labs  Lab 01/24/21 1426  AST 21  ALT 18  ALKPHOS 61  BILITOT 0.6  PROT 7.3  ALBUMIN 3.2*   No results for input(s): LIPASE, AMYLASE in the last 168 hours. No results for input(s): AMMONIA in the last 168 hours. Coagulation Profile: No results for input(s): INR, PROTIME in the last 168 hours. Cardiac Enzymes: No results for input(s): CKTOTAL, CKMB, CKMBINDEX, TROPONINI in the last 168 hours. BNP (last 3 results) No results for input(s): PROBNP in the last 8760 hours. HbA1C: No results for input(s): HGBA1C in the last 72 hours. CBG: Recent Labs  Lab 01/26/21 1205 01/26/21 1839 01/26/21 2210 01/27/21 0723 01/27/21 1203  GLUCAP 246* 206* 152* 144* 228*   Lipid Profile: No results for input(s): CHOL, HDL, LDLCALC, TRIG, CHOLHDL, LDLDIRECT in the last 72 hours. Thyroid Function Tests: No results for input(s): TSH, T4TOTAL, FREET4, T3FREE, THYROIDAB in the last 72 hours. Anemia Panel: No results for input(s): VITAMINB12, FOLATE, FERRITIN, TIBC, IRON, RETICCTPCT in the last 72 hours. Sepsis Labs: No results for input(s): PROCALCITON, LATICACIDVEN in the last 168 hours.  Recent Results (from the past 240 hour(s))  Resp Panel by RT-PCR (Flu A&B, Covid) Nasopharyngeal Swab     Status: None    Collection Time: 01/24/21  2:27 PM   Specimen: Nasopharyngeal Swab; Nasopharyngeal(NP) swabs in vial transport medium  Result Value Ref Range Status   SARS Coronavirus 2 by RT PCR NEGATIVE NEGATIVE Final    Comment: (NOTE) SARS-CoV-2 target nucleic acids are NOT DETECTED.  The SARS-CoV-2 RNA is generally detectable in upper respiratory specimens during the acute phase of infection. The lowest concentration of SARS-CoV-2 viral copies this assay can detect is 138 copies/mL. A negative result does not preclude SARS-Cov-2 infection and should not be used as the sole basis for treatment or other patient management decisions. A negative result may occur with  improper specimen collection/handling, submission of specimen other than nasopharyngeal swab, presence of viral mutation(s) within the areas targeted by this assay, and inadequate number of viral copies(<138 copies/mL). A negative result must be combined with clinical observations, patient history, and epidemiological information. The expected result is Negative.  Fact Sheet for Patients:  EntrepreneurPulse.com.au  Fact Sheet for Healthcare Providers:  IncredibleEmployment.be  This test is no t yet approved or cleared by the [place] FDA and  has been authorized for detection and/or diagnosis of SARS-CoV-2 by FDA under an Emergency Use Authorization (EUA). This EUA will remain  in effect (meaning this test can be used) for the duration of the COVID-19 declaration under Section 564(b)(1) of the Act, 21 U.S.C.section 360bbb-3(b)(1), unless the authorization is terminated  or revoked sooner.       Influenza A by PCR NEGATIVE NEGATIVE Final   Influenza B by PCR NEGATIVE NEGATIVE Final    Comment: (NOTE) The Xpert Xpress SARS-CoV-2/FLU/RSV plus assay is intended as an aid in the diagnosis of influenza from Nasopharyngeal swab specimens and should not be used as a sole basis for treatment.  Nasal washings and aspirates are unacceptable for Xpert Xpress SARS-CoV-2/FLU/RSV testing.  Fact Sheet for Patients:  EntrepreneurPulse.com.au  Fact Sheet for Healthcare Providers: IncredibleEmployment.be  This test is not yet approved or cleared by the [place] FDA and has been authorized for detection and/or diagnosis of SARS-CoV-2 by FDA under an Emergency Use Authorization (EUA). This EUA will remain in effect (meaning this test can be used) for the duration of the COVID-19 declaration under Section 564(b)(1) of the Act, 21 U.S.C. section 360bbb-3(b)(1), unless the authorization is terminated or revoked.  Performed at [facility], [place] [address], [place], [place] [address]          Radiology Studies: No results found.      Scheduled Meds: . allopurinol  100 mg Oral Daily  . carvedilol  12.5 mg Oral BID WC  . cinacalcet  30 mg Oral Q supper  . doxycycline  100 mg Oral Q12H  . insulin aspart  0-20 Units Subcutaneous TID WC  . insulin aspart  0-5 Units Subcutaneous QHS  . insulin glargine  10 Units Subcutaneous Daily  . levothyroxine  150 mcg Oral Q0600  . metolazone  5 mg Oral Q M,W,F  . mirabegron ER  50 mg Oral Daily  . mometasone-formoterol  2 puff Inhalation BID  . pantoprazole  40 mg Oral BID  . sodium chloride flush  3 mL Intravenous Q12H  . torsemide  100 mg Oral BID   Continuous Infusions:   Time spent: 20 minutes with over 50% of the time coordinating the patient's care    Harold Hedge, DO [facility]   Call night coverage person covering after 7pm

## 2021-01-27 NOTE — NC FL2 (Signed)
[place] LEVEL OF CARE SCREENING TOOL     IDENTIFICATION  Patient Name: [name] [name] Birthdate: 1951/05/10 Sex: female Admission Date (Current Location): 01/24/2021  [place] and [place] Number:  [name] [id number] [place] and Address:  [facility],  [place], [place]      Provider Number: [id number]  Attending Physician Name and Address:  Harold Hedge, MD  Relative Name and Phone Number:  Phifer,Ray Son   [phone]    Current Level of Care: Hospital Recommended Level of Care: [place] Prior Approval Number:    Date Approved/Denied:   PASRR Number: [id number]  Discharge Plan: SNF    Current Diagnoses: Patient Active Problem List   Diagnosis Date Noted  . Symptomatic anemia 01/24/2021  . Anemia, unspecified 01/08/2020  . Bacteremia 01/07/2020  . UTI (urinary tract infection) 01/06/2020  . Anemia 10/26/2019  . S/P lumbar fusion 09/18/2019  . Chronic CHF (congestive heart failure) ([place]) 03/16/2019  . Leukocytosis 03/16/2019  . Chest pain 11/27/2018  . Pre-syncope 11/27/2018  . Epigastric abdominal pain 11/27/2018  . Solitary pulmonary nodule 11/06/2018  . 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 CHF (congestive heart failure), NYHA class III, acute on chronic, systolic ([place]) 24/26/8341  . 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 Cellulitis of abdominal wall 03/21/2015  . Acute on chronic renal failure ([place]) 03/21/2015  . DM (diabetes mellitus), type 2 with renal complications ([place]) 96/22/2979  . Abdominal wall abscess 03/21/2015  . 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 Low urine output 08/11/2014  . Acute on chronic systolic CHF (congestive heart failure), NYHA class 3 ([place]) 08/11/2014  . Asthma, chronic 08/11/2014  . Hypothyroidism  08/11/2014  . OSA (obstructive sleep apnea) 08/11/2014  . Positive cardiac stress test 06/29/2014  . Hyperlipidemia 02/12/2014  . Left arm weakness 12/23/2013  . TIA (transient ischemic attack) 12/23/2013  . Left buttock abscess 10/12/2013  . Cellulitis and abscess of leg, right 04/16/2013  . Acute blood loss anemia 02/14/2013  . HTN (hypertension) 02/14/2013  . Cellulitis and abscess of buttock 02/09/2013  . Morbid obesity ([place]) 02/03/2013  . Diarrhea 02/03/2013  . Hyponatremia 02/03/2013  . DM (diabetes mellitus) ([place]) 02/02/2013  . CKD (chronic kidney disease), stage IV ([place]) 02/02/2013  . Cellulitis 02/02/2013  . PAD (peripheral artery disease) ([place]) 04/26/2011  . Tobacco abuse 04/26/2011    Orientation RESPIRATION BLADDER Height & Weight     Self,Time,Situation,Place    Incontinent Weight: 211 lb 10.3 oz (96 kg) Height:  5\' 2"  (157.5 cm)  BEHAVIORAL SYMPTOMS/MOOD NEUROLOGICAL BOWEL NUTRITION STATUS      Incontinent Diet ([facility])  AMBULATORY STATUS COMMUNICATION OF NEEDS Skin     Verbally Normal                       Personal Care Assistance Level of Assistance  Bathing,Dressing,Feeding Bathing Assistance: Limited assistance Feeding assistance: Independent Dressing Assistance: Limited assistance     Functional Limitations Info  Sight,Hearing,Speech Sight Info: Adequate Hearing Info: Adequate Speech Info: Adequate    SPECIAL CARE FACTORS FREQUENCY  PT (By licensed PT),OT (By licensed OT)     PT Frequency: Minimum 5x a week OT Frequency: Minimum 5x a week            Contractures Contractures Info: Not present    Additional Factors Info  Code Status,Allergies,Insulin Sliding Scale Code Status Info: Full Code Allergies Info: Nebivolol   Ace Inhibitors   Ace Inhibitors   Morphine And Related   Morphine And Related   Insulin Sliding Scale Info: insulin aspart (novoLOG) injection 0-20 Units 3x a day with meals       Current Medications  (01/27/2021):  This is the current hospital active medication list Current Facility-Administered Medications  Medication Dose Route Frequency Provider Last Rate Last Admin  . albuterol (PROVENTIL) (2.5 MG/3ML) 0.083% nebulizer solution 3 mL  3 mL Nebulization QID PRN Patrecia Pour, MD      . allopurinol (ZYLOPRIM) tablet 100 mg  100 mg Oral Daily Patrecia Pour, MD   100 mg at 01/27/21 0949  . alum & mag hydroxide-simeth (MAALOX/MYLANTA) 200-200-20 MG/5ML suspension 30 mL  30 mL Oral Q4H PRN Harold Hedge, MD   30 mL at 01/27/21 0126  . carvedilol (COREG) tablet 12.5 mg  12.5 mg Oral BID WC Vance Gather B, MD   12.5 mg at 01/27/21 0815  . cinacalcet (SENSIPAR) tablet 30 mg  30 mg Oral Q supper Patrecia Pour, MD   30 mg at 01/26/21 1639  . doxycycline (VIBRA-TABS) tablet 100 mg  100 mg Oral Q12H Patrecia Pour, MD   100 mg at 01/27/21 0949  . fluticasone (FLONASE) 50 MCG/ACT nasal spray 2 spray  2 spray Each Nare Daily PRN Patrecia Pour, MD      . hydrOXYzine (ATARAX/VISTARIL) tablet 25 mg  25 mg Oral BID PRN Patrecia Pour, MD   25 mg at 01/25/21 2211  . insulin aspart (novoLOG) injection 0-20 Units  0-20 Units Subcutaneous TID WC Patrecia Pour, MD   7 Units at 01/27/21 1235  . insulin aspart (novoLOG) injection 0-5 Units  0-5 Units Subcutaneous QHS Patrecia Pour, MD   2 Units at 01/25/21 2128  . insulin glargine (LANTUS) injection 10 Units  10 Units Subcutaneous Daily Harold Hedge, MD   10 Units at 01/27/21 [phone]  . levothyroxine (SYNTHROID) tablet 150 mcg  150 mcg Oral Q0600 Patrecia Pour, MD   150 mcg at 01/27/21 0612  . lip balm (CARMEX) ointment   Topical PRN Harold Hedge, MD      . metolazone (ZAROXOLYN) tablet 5 mg  5 mg Oral Q M,W,F Patrecia Pour, MD   5 mg at 01/27/21 0949  . mirabegron ER (MYRBETRIQ) tablet 50 mg  50 mg Oral Daily Patrecia Pour, MD   50 mg at 01/27/21 0949  . mometasone-formoterol (DULERA) 200-5 MCG/ACT inhaler 2 puff  2 puff Inhalation BID Patrecia Pour, MD   2 puff at  01/27/21 0804  . ondansetron (ZOFRAN) tablet 4 mg  4 mg Oral Q6H PRN Patrecia Pour, MD   4 mg at 01/27/21 0126   Or  . ondansetron (ZOFRAN) injection 4 mg  4 mg Intravenous Q6H PRN Patrecia Pour, MD      . oxyCODONE-acetaminophen (PERCOCET/ROXICET) 5-325 MG per tablet 1 tablet  1 tablet Oral Q6H PRN Polly Cobia, RPH   1 tablet at 01/27/21 1527   And  . oxyCODONE (Oxy IR/ROXICODONE) immediate release tablet 5 mg  5 mg Oral Q6H PRN Polly Cobia, RPH   5 mg at 01/27/21 0019  . pantoprazole (PROTONIX) EC tablet 40 mg  40 mg Oral BID Eudelia Bunch, RPH   40 mg at 01/27/21 0949  . polyethylene glycol (MIRALAX / GLYCOLAX)  packet 17 g  17 g Oral Daily PRN Harold Hedge, MD      . polyvinyl alcohol (LIQUIFILM TEARS) 1.4 % ophthalmic solution 2 drop  2 drop Both Eyes Daily PRN Vance Gather B, MD      . sodium chloride flush (NS) 0.9 % injection 3 mL  3 mL Intravenous Q12H Patrecia Pour, MD   3 mL at 01/27/21 1000  . torsemide (DEMADEX) tablet 100 mg  100 mg Oral BID Patrecia Pour, MD   100 mg at 01/27/21 0100     Discharge Medications: Please see discharge summary for a list of discharge medications.  Relevant Imaging Results:  Relevant Lab Results:   Additional Information SSN [id number]  Ross Ludwig, LCSW

## 2021-01-28 DIAGNOSIS — L731 Pseudofolliculitis barbae: Secondary | ICD-10-CM

## 2021-01-28 DIAGNOSIS — D62 Acute posthemorrhagic anemia: Secondary | ICD-10-CM | POA: Diagnosis not present

## 2021-01-28 DIAGNOSIS — D649 Anemia, unspecified: Secondary | ICD-10-CM | POA: Diagnosis not present

## 2021-01-28 DIAGNOSIS — D509 Iron deficiency anemia, unspecified: Secondary | ICD-10-CM | POA: Diagnosis not present

## 2021-01-28 DIAGNOSIS — L0231 Cutaneous abscess of buttock: Secondary | ICD-10-CM | POA: Diagnosis not present

## 2021-01-28 LAB — CBC
HCT: 27.7 % — ABNORMAL LOW (ref 36.0–46.0)
Hemoglobin: 8.3 g/dL — ABNORMAL LOW (ref 12.0–15.0)
MCH: 24.8 pg — ABNORMAL LOW (ref 26.0–34.0)
MCHC: 30 g/dL (ref 30.0–36.0)
MCV: 82.7 fL (ref 80.0–100.0)
Platelets: 431 10*3/uL — ABNORMAL HIGH (ref 150–400)
RBC: 3.35 MIL/uL — ABNORMAL LOW (ref 3.87–5.11)
RDW: 18.6 % — ABNORMAL HIGH (ref 11.5–15.5)
WBC: 10 10*3/uL (ref 4.0–10.5)
nRBC: 0.2 % (ref 0.0–0.2)

## 2021-01-28 LAB — GLUCOSE, CAPILLARY
Glucose-Capillary: 151 mg/dL — ABNORMAL HIGH (ref 70–99)
Glucose-Capillary: 218 mg/dL — ABNORMAL HIGH (ref 70–99)
Glucose-Capillary: 199 mg/dL — ABNORMAL HIGH (ref 70–99)
Glucose-Capillary: 171 mg/dL — ABNORMAL HIGH (ref 70–99)

## 2021-01-28 MED ORDER — CLOPIDOGREL BISULFATE 75 MG PO TABS
75.0000 mg | ORAL_TABLET | Freq: Every day | ORAL | Status: DC
  Administered 2021-01-28 – 2021-01-31 (×4): 75 mg via ORAL
  Filled 2021-01-28 (×4): qty 1

## 2021-01-28 MED ORDER — SENNA 8.6 MG PO TABS
1.0000 | ORAL_TABLET | Freq: Every day | ORAL | Status: AC
  Administered 2021-01-28 – 2021-01-29 (×2): 8.6 mg via ORAL
  Filled 2021-01-28 (×2): qty 1

## 2021-01-28 NOTE — Progress Notes (Signed)
PROGRESS NOTE    [name] [name]    Code Status: Full Code  CBS:[id number] DOB: 07-Dec-1950 DOA: 01/24/2021 LOS: 0 days  PCP: [name], MD CC:  Chief Complaint  Patient presents with  . Rectal Bleeding  . Abnormal Lab       Hospital Summary   This is a 70 year old female with a history of CKD 5 (not on HD, follows with Dr. [name]), chronic combined systolic and diastolic CHF, COPD, GERD, IDDM, HTN, chronic iron deficiency anemia and anemia of CKD who gets regular IV iron infusions and EPO injections who was sent to the ED on 4/19 from the [place] office due to concern for GI bleeding and symptomatic anemia (Hb 7.5) with a syncopal episode several weeks ago and fatigue. She also reported a left buttock boil that recently ruptured.   ED Course: Afebrile, hypertensive, in no distress. +FOBT without gross bleeding. Hgb 7.7g/dl. Metabolic panel shows hypokalemia and derangements near the patient's baseline including BUN 168, Cr 3.03. Admission requested, GI consulted. She received 1 u PRBCs upon admission due to symptomatic anemia.   4/20 EGD: Erosive gastritis with flecks of old blood that look like cameron erosions though no significant hiatal hernia. Normal ampulla, duodenal bulb, first and second portion of the duodenum, major papilla, area of the papilla and third portion of the duodenum. Hematin in the stomach and chronic gastritis otherwise normal. No specimens collected.  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 Acute on chronic symptomatic anemia suspected secondary to GI bleed with history of anemia of CKD and iron deficiency anemia s/p 1 unit PRBCs, stable a. Received EPO 4/18 and due for IV iron on 4/25 as scheduled b. EGD 4/20: erosive gastritis c. Restart Plavix and follow up CBC in AM d. Protonix 40 mg BID x 2 months then back to QD  2. Cellulitis of buttock, resolved  a. Afebrile and resolved leukocytosis b. Continue doxycycline Day 5/5 c. No purulence or erythema but does have a healing small 1 mm open wound with indurated skin. She has been applying topical clobetasol cream from home and advised to stop.  3. Leg cramping/right shoulder pain, likely musculoskeletal, resolved a. Using home topical spray b. On percocet and oxycodone  4. Hypokalemia, resolved  5. Hypomagnesemia, resolved  6. Chronic combined systolic/diastolic CHF, not in acute exacerbation a. Continue home torsemide, metolazone MWF and coreg  7. COPD, not in acute exacerbation a. Continue Dulera  8. Constipation a. PRN miralax  9. IDDM a. Holding insulin 75/25 b. Continue HS and sliding scale add on Lantus and carb modified diet  10. Peripheral vascular disease a. plavix as above  11. Chronic pain a. continue home meds  12. Hypothyroidism a. Continue synthroid  13. Recent syncopal episode  Generalized weakness a. Occurred a few weeks ago and sounds like a vasovagal episode at home. Had unremarkable head imaging at the time b. Has bilateral knee ecchymosis  and left black eye c. PT recommending SNF  14. Ingrown hair of the vagina a. Warm compresses  DVT prophylaxis: SCDs Start: 01/24/21 1744   Family Communication: No family at bedside today  Disposition Plan: Medically stable for discharge, currently waiting on SNF availability Status is: Observation  The patient remains OBS appropriate   Dispo: The patient is from: Home               Anticipated d/c is to: SNF              Patient currently is medically stable to d/c.   Difficult to place patient No                 Pressure injury documentation    None  Consultants  GI  Procedures  4/20: EGD 1 unit PRBCs  Antibiotics   Anti-infectives (From admission, onward)   Start     Dose/Rate Route Frequency Ordered Stop   01/26/21 0000  doxycycline (VIBRA-TABS) 100 MG tablet        100 mg Oral Every 12 hours 01/26/21 1224 01/29/21 2359   01/24/21 2200  doxycycline (VIBRA-TABS) tablet 100 mg        100 mg Oral Every 12 hours 01/24/21 1703          Subjective   Feeling better but frustrated she has to wait for SNF. She noticed a bump near her vagina that she has been scratching. No purulence from it and no pain. She has also been using clobetasol cream for her recent buttock boil for itching. She was advised not to use this and to stop scratching. No fevers or any bleeding or other issues  Objective   Vitals:   01/27/21 1918 01/27/21 2114 01/28/21 0443 01/28/21 1434  BP:  (!) 112/57 135/78 (!) 122/38  Pulse:  80 81 80  Resp:  18 20 20   Temp:  98.1 F (36.7 C) 98 F (36.7 C) 98 F (36.7 C)  TempSrc:  Oral Oral Oral  SpO2: 100%  100% 100%  Weight:      Height:        Intake/Output Summary (Last 24 hours) at 01/28/2021 1611 Last data filed at 01/27/2021 1700 Gross per 24 hour  Intake 240 ml  Output --  Net 240 ml   Filed Weights   01/24/21 1223 01/24/21 1818  Weight: 90.7 kg 96 kg    Examination:  Physical Exam Vitals and nursing note reviewed. Chaperone present: RN present for exam.  Constitutional:      Appearance: Normal appearance.  HENT:     Head: Normocephalic and atraumatic.  Eyes:     Conjunctiva/sclera: Conjunctivae normal.  Cardiovascular:     Rate and Rhythm: Normal rate and regular rhythm.  Pulmonary:     Effort: Pulmonary effort is normal.     Breath sounds: Normal breath sounds.  Abdominal:     General:  Abdomen is flat.     Palpations: Abdomen is soft.  Genitourinary:   Musculoskeletal:        General: No swelling or tenderness.  Skin:    Coloration: Skin is not jaundiced or pale.  Neurological:     Mental Status: She is alert. Mental status is at baseline.  Psychiatric:        Mood and Affect: Mood normal.        Behavior: Behavior normal.     Data Reviewed: I have personally reviewed following labs and imaging studies  CBC: Recent Labs  Lab 01/23/21 1049 01/24/21 1426 01/25/21 0540 01/26/21 0528 01/28/21 0532  WBC  --  9.5 11.3* 10.3 10.0  HGB 7.5* 7.7* 7.8* 8.4* 8.3*  HCT  --  25.5* 25.1* 27.8* 27.7*  MCV  --  79.9* 80.7 82.2 82.7  PLT  --  395 362 418* 195*   Basic Metabolic Panel: Recent Labs  Lab 01/24/21 1426 01/25/21 0540 01/26/21 0528  NA 135 132* 137  K 2.9* 3.3* 3.4*  CL 92* 91* 95*  CO2 29 26 27   GLUCOSE 148* 166* 208*  BUN 168* 172* 160*  CREATININE 3.03* 3.12* 3.10*  CALCIUM 9.9 9.4 9.6  MG  --  1.5* 1.9   GFR: Estimated Creatinine Clearance: 18.3 mL/min (A) (by C-G formula based on SCr of 3.1 mg/dL (H)). Liver Function Tests: Recent Labs  Lab 01/24/21 1426  AST 21  ALT 18  ALKPHOS 61  BILITOT 0.6  PROT 7.3  ALBUMIN 3.2*   No results for input(s): LIPASE, AMYLASE in the last 168 hours. No results for input(s): AMMONIA in the last 168 hours. Coagulation Profile: No results for input(s): INR, PROTIME in the last 168 hours. Cardiac Enzymes: No results for input(s): CKTOTAL, CKMB, CKMBINDEX, TROPONINI in the last 168 hours. BNP (last 3 results) No results for input(s): PROBNP in the last 8760 hours. HbA1C: No results for input(s): HGBA1C in the last 72 hours. CBG: Recent Labs  Lab 01/27/21 1203 01/27/21 1646 01/27/21 2116 01/28/21 0751 01/28/21 1133  GLUCAP 228* 199* 193* 151* 218*   Lipid Profile: No results for input(s): CHOL, HDL, LDLCALC, TRIG, CHOLHDL, LDLDIRECT in the last 72 hours. Thyroid Function Tests: No  results for input(s): TSH, T4TOTAL, FREET4, T3FREE, THYROIDAB in the last 72 hours. Anemia Panel: No results for input(s): VITAMINB12, FOLATE, FERRITIN, TIBC, IRON, RETICCTPCT in the last 72 hours. Sepsis Labs: No results for input(s): PROCALCITON, LATICACIDVEN in the last 168 hours.  Recent Results (from the past 240 hour(s))  Resp Panel by RT-PCR (Flu A&B, Covid) Nasopharyngeal Swab     Status: None   Collection Time: 01/24/21  2:27 PM   Specimen: Nasopharyngeal Swab; Nasopharyngeal(NP) swabs in vial transport medium  Result Value Ref Range Status   SARS Coronavirus 2 by RT PCR NEGATIVE NEGATIVE Final    Comment: (NOTE) SARS-CoV-2 target nucleic acids are NOT DETECTED.  The SARS-CoV-2 RNA is generally detectable in upper respiratory specimens during the acute phase of infection. The lowest concentration of SARS-CoV-2 viral copies this assay can detect is 138 copies/mL. A negative result does not preclude SARS-Cov-2 infection and should not be used as the sole basis for treatment or other patient management decisions. A negative result may occur with  improper specimen collection/handling, submission of specimen other than nasopharyngeal swab, presence of viral mutation(s) within the areas targeted by this assay, and inadequate number of viral copies(<138 copies/mL). A negative result must be combined with clinical observations, patient history, and epidemiological information. The expected result is Negative.  Fact Sheet for Patients:  EntrepreneurPulse.com.au  Fact Sheet for Healthcare Providers:  IncredibleEmployment.be  This test is no t yet approved or cleared by the [place] FDA and  has been authorized for detection and/or diagnosis of SARS-CoV-2 by FDA under an Emergency Use Authorization (EUA). This EUA will remain  in effect (meaning this test can be used) for the duration of the COVID-19 declaration under Section 564(b)(1) of  the Act, 21 U.S.C.section 360bbb-3(b)(1), unless the authorization is terminated  or revoked sooner.  Influenza A by PCR NEGATIVE NEGATIVE Final   Influenza B by PCR NEGATIVE NEGATIVE Final    Comment: (NOTE) The Xpert Xpress SARS-CoV-2/FLU/RSV plus assay is intended as an aid in the diagnosis of influenza from Nasopharyngeal swab specimens and should not be used as a sole basis for treatment. Nasal washings and aspirates are unacceptable for Xpert Xpress SARS-CoV-2/FLU/RSV testing.  Fact Sheet for Patients: EntrepreneurPulse.com.au  Fact Sheet for Healthcare Providers: IncredibleEmployment.be  This test is not yet approved or cleared by the [place] FDA and has been authorized for detection and/or diagnosis of SARS-CoV-2 by FDA under an Emergency Use Authorization (EUA). This EUA will remain in effect (meaning this test can be used) for the duration of the COVID-19 declaration under Section 564(b)(1) of the Act, 21 U.S.C. section 360bbb-3(b)(1), unless the authorization is terminated or revoked.  Performed at [facility], [place] [address], [place], [place] [address]          Radiology Studies: No results found.      Scheduled Meds: . allopurinol  100 mg Oral Daily  . carvedilol  12.5 mg Oral BID WC  . cinacalcet  30 mg Oral Q supper  . doxycycline  100 mg Oral Q12H  . insulin aspart  0-20 Units Subcutaneous TID WC  . insulin aspart  0-5 Units Subcutaneous QHS  . insulin glargine  10 Units Subcutaneous Daily  . levothyroxine  150 mcg Oral Q0600  . metolazone  5 mg Oral Q M,W,F  . mirabegron ER  50 mg Oral Daily  . mometasone-formoterol  2 puff Inhalation BID  . pantoprazole  40 mg Oral BID  . senna  1 tablet Oral Daily  . sodium chloride flush  3 mL Intravenous Q12H  . torsemide  100 mg Oral BID   Continuous Infusions:   Time spent: 30 minutes with over 50% of the time coordinating the  patient's care    Harold Hedge, DO [facility]   Call night coverage person covering after 7pm

## 2021-01-29 DIAGNOSIS — D649 Anemia, unspecified: Secondary | ICD-10-CM | POA: Diagnosis not present

## 2021-01-29 DIAGNOSIS — D509 Iron deficiency anemia, unspecified: Secondary | ICD-10-CM | POA: Diagnosis not present

## 2021-01-29 LAB — CBC
HCT: 26.3 % — ABNORMAL LOW (ref 36.0–46.0)
Hemoglobin: 8 g/dL — ABNORMAL LOW (ref 12.0–15.0)
MCH: 24.5 pg — ABNORMAL LOW (ref 26.0–34.0)
MCHC: 30.4 g/dL (ref 30.0–36.0)
MCV: 80.4 fL (ref 80.0–100.0)
Platelets: 441 10*3/uL — ABNORMAL HIGH (ref 150–400)
RBC: 3.27 MIL/uL — ABNORMAL LOW (ref 3.87–5.11)
RDW: 18.5 % — ABNORMAL HIGH (ref 11.5–15.5)
WBC: 10.7 10*3/uL — ABNORMAL HIGH (ref 4.0–10.5)
nRBC: 0.2 % (ref 0.0–0.2)

## 2021-01-29 LAB — GLUCOSE, CAPILLARY
Glucose-Capillary: 141 mg/dL — ABNORMAL HIGH (ref 70–99)
Glucose-Capillary: 204 mg/dL — ABNORMAL HIGH (ref 70–99)
Glucose-Capillary: 125 mg/dL — ABNORMAL HIGH (ref 70–99)
Glucose-Capillary: 232 mg/dL — ABNORMAL HIGH (ref 70–99)

## 2021-01-29 MED ORDER — GLYCERIN (LAXATIVE) 2 G RE SUPP
1.0000 | Freq: Once | RECTAL | Status: AC
  Administered 2021-01-29: 1 via RECTAL
  Filled 2021-01-29: qty 1

## 2021-01-29 MED ORDER — SORBITOL 70 % SOLN
960.0000 mL | TOPICAL_OIL | Freq: Once | ORAL | Status: AC
  Administered 2021-01-29: 960 mL via RECTAL
  Filled 2021-01-29: qty 473

## 2021-01-29 NOTE — TOC Initial Note (Addendum)
Transition of Care [facility]) - Initial/Assessment Note    Patient Details  Name: [name] [name] MRN: [id number] Date of Birth: April 02, 1951  Transition [facility]) CM/SW Contact:    Elliot Gurney [place], [place] Phone Number: [phone] 01/29/2021, 10:52 AM  Clinical Narrative:                 Patient is a 70 year old female presented to the ED from [facility] GI office due to GI bleeding and decreased hemoglobin to 7.5g/dl. This Education officer, museum met with patient at bedside and spoke to patient by phone to discuss bed offers. List of bed offers provided, patient's son would like to research offers and will call this social worker back with decision made. Patient lives alone in an apartment. Per patient, her son stays with her at night for supervision. Patient has an aid that comes 6 days a week from 10am-1pm to assist with daily care needs. Patient states that she has a walker, bedside commode, shower chair, and a motorized scooter. Patient uses [place] but gets her pain medications through [employer]. Patient's son discussed desire for patient to receive [facility] services post discharge from rehab if available.    Candace Cruise started, pending Josem Kaufmann #[id number] 4/24-4/26 clinicals faxed to [phone].  Transition of Care to continue to follow for discharge needs.   [place], LCSW Transition of Care [phone]  Expected Discharge Plan: Skilled Nursing Facility Barriers to Discharge: Continued Medical Work up   Patient Goals and CMS Choice Patient states their goals for this hospitalization and ongoing recovery are:: "I want to be able to get up and walk and drive myself where I need to go" CMS Medicare.gov Compare Post Acute Care list provided to:: Patient Choice offered to / list presented to : Patient  Expected Discharge Plan and Services Expected Discharge Plan: [place] In-house Referral: Clinical Social Work     Living arrangements for the past 2 months:  Apartment Expected Discharge Date: 01/26/21                                    Prior Living Arrangements/Services Living arrangements for the past 2 months: Apartment Lives with:: Adult Children (son stays with patient at night) Patient language and need for interpreter reviewed:: No Do you feel safe going back to the place where you live?: Yes      Need for Family Participation in Patient Care: Yes (Comment) Care giver support system in place?: Yes (comment) Current home services: Homehealth aide,DME (walker, shower chair, bedside commode, motorized scooter) Criminal Activity/Legal Involvement Pertinent to Current Situation/Hospitalization: No - Comment as needed  Activities of Daily Living Home Assistive Devices/Equipment: Walker (specify type),Bedside commode/3-in-1,Shower chair with back,Eyeglasses,Dentures (specify type) (upper partial) ADL Screening (condition at time of admission) Patient's cognitive ability adequate to safely complete daily activities?: Yes Is the patient deaf or have difficulty hearing?: No Does the patient have difficulty seeing, even when wearing glasses/contacts?: Yes (vision blurry at times) Does the patient have difficulty concentrating, remembering, or making decisions?: No Patient able to express need for assistance with ADLs?: Yes Does the patient have difficulty dressing or bathing?: Yes Independently performs ADLs?: No Communication: Independent Dressing (OT): Needs assistance Is this a change from baseline?: Pre-admission baseline Grooming: Independent Feeding: Independent Bathing: Needs assistance Is this a change from baseline?: Pre-admission baseline Toileting: Needs assistance Is this a change from baseline?: Pre-admission baseline In/Out Bed: Needs  assistance Is this a change from baseline?: Pre-admission baseline Walks in Home: Needs assistance Is this a change from baseline?: Pre-admission baseline Does the patient have  difficulty walking or climbing stairs?: Yes Weakness of Legs: Both Weakness of Arms/Hands: Both  Permission Sought/Granted   Permission granted to share information with : Yes, Verbal Permission Granted        Permission granted to share info w Relationship: Ray Pheifer  Permission granted to share info w Contact Information: [phone]  Emotional Assessment   Attitude/Demeanor/Rapport: Engaged Affect (typically observed): Accepting,Adaptable Orientation: : Oriented to Self,Oriented to Place,Oriented to  Time,Oriented to Situation Alcohol / Substance Use: Not Applicable Psych Involvement: No (comment)  Admission diagnosis:  Symptomatic anemia [D64.9] Patient Active Problem List   Diagnosis Date Noted  . Symptomatic anemia 01/24/2021  . Anemia, unspecified 01/08/2020  . Bacteremia 01/07/2020  . UTI (urinary tract infection) 01/06/2020  . Anemia 10/26/2019  . S/P lumbar fusion 09/18/2019  . Chronic CHF (congestive heart failure) (HCC) 03/16/2019  . Leukocytosis 03/16/2019  . Chest pain 11/27/2018  . Pre-syncope 11/27/2018  . Epigastric abdominal pain 11/27/2018  . Solitary pulmonary nodule 11/06/2018  . Hypertensive heart and renal disease, stage 1-4 or unspecified chronic kidney disease, with heart failure (HCC) 10/20/2018  . CHF (congestive heart failure), NYHA class III, acute on chronic, systolic (HCC) 10/20/2018  . 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 Cellulitis of abdominal wall 03/21/2015  . Acute on chronic renal failure (HCC) 03/21/2015  . DM (diabetes mellitus), type 2 with renal complications (HCC) 03/21/2015  . Abdominal wall abscess 03/21/2015  . 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 Low urine output 08/11/2014  . Acute on chronic systolic CHF (congestive heart failure), NYHA class 3 (HCC) 08/11/2014  . Asthma, chronic 08/11/2014  . Hypothyroidism 08/11/2014  . OSA  (obstructive sleep apnea) 08/11/2014  . Positive cardiac stress test 06/29/2014  . Hyperlipidemia 02/12/2014  . Left arm weakness 12/23/2013  . TIA (transient ischemic attack) 12/23/2013  . Left buttock abscess 10/12/2013  . Cellulitis and abscess of leg, right 04/16/2013  . Acute blood loss anemia 02/14/2013  . HTN (hypertension) 02/14/2013  . Cellulitis and abscess of buttock 02/09/2013  . Morbid obesity (HCC) 02/03/2013  . Diarrhea 02/03/2013  . Hyponatremia 02/03/2013  . DM (diabetes mellitus) (HCC) 02/02/2013  . CKD (chronic kidney disease), stage IV (HCC) 02/02/2013  . Cellulitis 02/02/2013  . PAD (peripheral artery disease) (HCC) 04/26/2011  . Tobacco abuse 04/26/2011   PCP:  Avbuere, Edwin, MD Pharmacy:   [id number] - [place], [place] - [address] [place] [address] Phone: [phone] Fax: [phone]   Transitions of Care Pharmacy [address] [place] [address] Phone: [phone] Fax: [phone]     Social Determinants of Health (SDOH) Interventions    Readmission Risk Interventions Readmission Risk Prevention Plan 01/14/2020  Transportation Screening Complete  Medication Review (RN Care Manager) Complete  PCP or Specialist appointment within 3-5 days of discharge Complete  HRI or Home Care Consult Complete  SW Recovery Care/Counseling Consult Complete  Palliative Care Screening Not Applicable  Skilled Nursing Facility Complete  Some recent data might be hidden    

## 2021-01-29 NOTE — TOC Progression Note (Signed)
Transition of Care [facility]) - Progression Note    Patient Details  Name: [name] [name] MRN: [id number] Date of Birth: [birth year]  [facility] of [facility]) CM/SW Contact  Purcell Mouton, RN Phone Number: 01/29/2021, 3:50 PM  Clinical Narrative:     Clinicals faxed to [facility] Ref [id number].  Expected Discharge Plan: [place] Barriers to Discharge: Continued Medical Work up  Expected Discharge Plan and Services Expected Discharge Plan: [place] In-house Referral: Clinical Social Work     Living arrangements for the past 2 months: Apartment Expected Discharge Date: 01/26/21                                     Social Determinants of Health (SDOH) Interventions    Readmission Risk Interventions Readmission Risk Prevention Plan 01/14/2020  Transportation Screening Complete  Medication Review Press photographer) Complete  PCP or Specialist appointment within 3-5 days of discharge Complete  HRI or Home Care Consult Complete  SW Recovery Care/Counseling Consult Complete  Palliative Care Screening Not [place] Complete  Some recent data might be hidden

## 2021-01-29 NOTE — Progress Notes (Signed)
PROGRESS NOTE    MARLIE KUENNEN    Code Status: Full Code  ZOX:[id number] DOB: 22-Oct-1950 DOA: 01/24/2021 LOS: 0 days  PCP: [name], MD CC:  Chief Complaint  Patient presents with  . Rectal Bleeding  . Abnormal Lab       Hospital Summary   This is a 70 year old female with a history of CKD 5 (not on HD, follows with Dr. [name]), chronic combined systolic and diastolic CHF, COPD, GERD, IDDM, HTN, chronic iron deficiency anemia and anemia of CKD who gets regular IV iron infusions and EPO injections who was sent to the ED on 4/19 from the [place] office due to concern for GI bleeding and symptomatic anemia (Hb 7.5) with a syncopal episode several weeks ago and fatigue. She also reported a left buttock boil that recently ruptured.   ED Course: Afebrile, hypertensive, in no distress. +FOBT without gross bleeding. Hgb 7.7g/dl. Metabolic panel shows hypokalemia and derangements near the patient's baseline including BUN 168, Cr 3.03. Admission requested, GI consulted. She received 1 u PRBCs upon admission due to symptomatic anemia.   4/20 EGD: Erosive gastritis with flecks of old blood that look like cameron erosions though no significant hiatal hernia. Normal ampulla, duodenal bulb, first and second portion of the duodenum, major papilla, area of the papilla and third portion of the duodenum. Hematin in the stomach and chronic gastritis otherwise normal. No specimens collected.  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 Acute on chronic symptomatic anemia suspected secondary to GI bleed with history of anemia of CKD and iron deficiency anemia s/p 1 unit PRBCs, stable a. Received EPO 4/18 and due for IV iron on 4/25 as scheduled b. EGD 4/20: erosive gastritis c. Plavix restarted 4/23 and hemoglobin is stable d. Protonix 40 mg BID x 2 months then back to QD  2. Cellulitis of buttock, resolved  a. Afebrile and resolved leukocytosis b. Completed 5 days doxycycline  3. Leg cramping/right shoulder pain, likely musculoskeletal, resolved a. Using home topical spray b. On percocet and oxycodone  4. Hypokalemia, resolved  5. Hypomagnesemia, resolved  6. Chronic combined systolic/diastolic CHF, not in acute exacerbation a. Continue home torsemide, metolazone MWF and coreg  7. COPD, not in acute exacerbation a. Continue Dulera  8. Constipation a. No BM with senna and MiraLAX b. Glycerin suppository x1  9. IDDM a. Holding insulin 75/25 b. Continue HS and sliding scale add on Lantus and carb modified diet  10. Peripheral vascular disease a. plavix as above  11. Chronic pain a. continue home meds and bowel regimen  12. Hypothyroidism a. Continue synthroid  13. Recent syncopal episode  Generalized weakness a. Occurred a few weeks ago and sounds like a vasovagal episode at home. Had unremarkable head imaging at the time b. Has bilateral knee ecchymosis and left black eye c. PT recommending SNF  14. Ingrown hair of the vagina a. Warm compresses  DVT prophylaxis:  SCDs Start: 01/24/21 1744   Family Communication: Patient updated family  Disposition Plan: Medically stable for discharge, currently waiting on SNF availability Status is: Observation  The patient remains OBS appropriate   Dispo: The patient is from: Home              Anticipated d/c is to: SNF              Patient currently is medically stable to d/c.   Difficult to  place patient No                 Pressure injury documentation    None  Consultants  GI  Procedures  4/20: EGD 1 unit PRBCs  Antibiotics   Anti-infectives (From admission, onward)   Start     Dose/Rate Route Frequency Ordered Stop   01/26/21 0000  doxycycline (VIBRA-TABS) 100 MG tablet        100 mg Oral Every 12 hours 01/26/21 1224 01/29/21 2359   01/24/21 2200  doxycycline (VIBRA-TABS) tablet 100 mg        100 mg Oral Every 12 hours 01/24/21 1703          Subjective   She is feeling much better though says she has not had a BM since before coming to the hospital.  Otherwise no complaints and no overnight events.  Objective   Vitals:   01/28/21 2201 01/29/21 0501 01/29/21 0813 01/29/21 0815  BP: (!) 150/90 120/67    Pulse: 90 79    Resp: 20 18    Temp: 98 F (36.7 C) 98.1 F (36.7 C)    TempSrc: Oral Oral    SpO2: 100% 97% 98% 98%  Weight:      Height:        Intake/Output Summary (Last 24 hours) at 01/29/2021 1405 Last data filed at 01/29/2021 0626 Gross per 24 hour  Intake --  Output 1500 ml  Net -1500 ml   Filed Weights   01/24/21 1223 01/24/21 1818  Weight: 90.7 kg 96 kg    Examination:  Physical Exam Vitals and nursing note reviewed.  Constitutional:      Appearance: Normal appearance.  HENT:     Head: Normocephalic and atraumatic.  Eyes:     Conjunctiva/sclera: Conjunctivae normal.  Cardiovascular:     Rate and Rhythm: Normal rate and regular rhythm.  Pulmonary:     Effort: Pulmonary effort is normal.     Breath sounds: Normal breath sounds.  Abdominal:     General: Abdomen is flat. Bowel sounds are normal.     Palpations: Abdomen is soft.  Musculoskeletal:        General: No swelling or tenderness.  Skin:    Coloration: Skin is not jaundiced or pale.  Neurological:     Mental Status: She is alert. Mental status is at baseline.  Psychiatric:        Mood and Affect: Mood normal.        Behavior: Behavior normal.      Data Reviewed: I have personally reviewed following labs and imaging studies  CBC: Recent Labs  Lab 01/24/21 1426 01/25/21 0540 01/26/21 0528 01/28/21 0532 01/29/21 0539  WBC 9.5 11.3* 10.3 10.0 10.7*  HGB 7.7* 7.8* 8.4* 8.3* 8.0*  HCT 25.5* 25.1* 27.8* 27.7* 26.3*  MCV 79.9* 80.7 82.2 82.7 80.4  PLT 395 362 418* 431* 831*   Basic Metabolic Panel: Recent Labs  Lab 01/24/21 1426 01/25/21 0540 01/26/21 0528  NA 135 132* 137  K 2.9* 3.3* 3.4*  CL 92* 91* 95*  CO2 29 26 27   GLUCOSE 148* 166* 208*  BUN 168* 172* 160*  CREATININE 3.03* 3.12* 3.10*  CALCIUM 9.9 9.4 9.6  MG  --  1.5* 1.9   GFR: Estimated Creatinine Clearance: 18.3 mL/min (A) (by C-G formula based on SCr of 3.1 mg/dL (H)). Liver Function Tests: Recent Labs  Lab 01/24/21 1426  AST 21  ALT 18  ALKPHOS 61  BILITOT 0.6  PROT 7.3  ALBUMIN 3.2*   No results for input(s): LIPASE, AMYLASE in the last 168 hours. No results for input(s): AMMONIA in the last 168 hours. Coagulation Profile: No results for input(s): INR, PROTIME in the last 168 hours. Cardiac Enzymes: No results for input(s): CKTOTAL, CKMB, CKMBINDEX, TROPONINI in the last 168 hours. BNP (last 3 results) No results for input(s): PROBNP in the last 8760 hours. HbA1C: No results for input(s): HGBA1C in the last 72 hours. CBG: Recent Labs  Lab 01/28/21 1133 01/28/21 1643 01/28/21 2157 01/29/21 0800 01/29/21 1145  GLUCAP 218* 199* 171* 141* 204*   Lipid Profile: No results for input(s): CHOL, HDL, LDLCALC, TRIG, CHOLHDL, LDLDIRECT in the last 72 hours. Thyroid Function Tests: No results for input(s): TSH, T4TOTAL, FREET4, T3FREE, THYROIDAB in the last 72 hours. Anemia Panel: No results for input(s): VITAMINB12, FOLATE, FERRITIN, TIBC, IRON, RETICCTPCT in the last 72 hours. Sepsis Labs: No results for input(s): PROCALCITON, LATICACIDVEN in the last 168 hours.  Recent Results (from the past 240 hour(s))  Resp Panel by RT-PCR  (Flu A&B, Covid) Nasopharyngeal Swab     Status: None   Collection Time: 01/24/21  2:27 PM   Specimen: Nasopharyngeal Swab; Nasopharyngeal(NP) swabs in vial transport medium  Result Value Ref Range Status   SARS Coronavirus 2 by RT PCR NEGATIVE NEGATIVE Final    Comment: (NOTE) SARS-CoV-2 target nucleic acids are NOT DETECTED.  The SARS-CoV-2 RNA is generally detectable in upper respiratory specimens during the acute phase of infection. The lowest concentration of SARS-CoV-2 viral copies this assay can detect is 138 copies/mL. A negative result does not preclude SARS-Cov-2 infection and should not be used as the sole basis for treatment or other patient management decisions. A negative result may occur with  improper specimen collection/handling, submission of specimen other than nasopharyngeal swab, presence of viral mutation(s) within the areas targeted by this assay, and inadequate number of viral copies(<138 copies/mL). A negative result must be combined with clinical observations, patient history, and epidemiological information. The expected result is Negative.  Fact Sheet for Patients:  EntrepreneurPulse.com.au  Fact Sheet for Healthcare Providers:  IncredibleEmployment.be  This test is no t yet approved or cleared by the [place] FDA and  has been authorized for detection and/or diagnosis of SARS-CoV-2 by FDA under an Emergency Use Authorization (EUA). This EUA will remain  in effect (meaning this test can be used) for the duration of the COVID-19 declaration under Section 564(b)(1) of the Act, 21 U.S.C.section 360bbb-3(b)(1), unless the authorization is terminated  or revoked sooner.       Influenza A by PCR NEGATIVE NEGATIVE Final   Influenza B by PCR NEGATIVE NEGATIVE Final    Comment: (NOTE) The Xpert Xpress SARS-CoV-2/FLU/RSV plus assay is intended as an aid in the diagnosis of influenza from Nasopharyngeal swab specimens  and should not be used as a sole basis for treatment. Nasal washings and aspirates are unacceptable for Xpert Xpress SARS-CoV-2/FLU/RSV testing.  Fact Sheet for Patients: EntrepreneurPulse.com.au  Fact Sheet for  Healthcare Providers: IncredibleEmployment.be  This test is not yet approved or cleared by the [place] and has been authorized for detection and/or diagnosis of SARS-CoV-2 by FDA under an Emergency Use Authorization (EUA). This EUA will remain in effect (meaning this test can be used) for the duration of the COVID-19 declaration under Section 564(b)(1) of the Act, 21 U.S.C. section 360bbb-3(b)(1), unless the authorization is terminated or revoked.  Performed at [facility], [place] [address], [place], [place] [address]          Radiology Studies: No results found.      Scheduled Meds: . allopurinol  100 mg Oral Daily  . carvedilol  12.5 mg Oral BID WC  . cinacalcet  30 mg Oral Q supper  . clopidogrel  75 mg Oral Daily  . doxycycline  100 mg Oral Q12H  . insulin aspart  0-20 Units Subcutaneous TID WC  . insulin aspart  0-5 Units Subcutaneous QHS  . insulin glargine  10 Units Subcutaneous Daily  . levothyroxine  150 mcg Oral Q0600  . metolazone  5 mg Oral Q M,W,F  . mirabegron ER  50 mg Oral Daily  . mometasone-formoterol  2 puff Inhalation BID  . pantoprazole  40 mg Oral BID  . sodium chloride flush  3 mL Intravenous Q12H  . torsemide  100 mg Oral BID   Continuous Infusions:   Time spent: 26 minutes with over 50% of the time coordinating the patient's care    Harold Hedge, DO [facility]   Call night coverage person covering after 7pm

## 2021-01-30 ENCOUNTER — Inpatient Hospital Stay (HOSPITAL_COMMUNITY): Admission: RE | Admit: 2021-01-30 | Payer: Medicare Other | Source: Ambulatory Visit

## 2021-01-30 DIAGNOSIS — D649 Anemia, unspecified: Secondary | ICD-10-CM | POA: Diagnosis not present

## 2021-01-30 DIAGNOSIS — M7989 Other specified soft tissue disorders: Secondary | ICD-10-CM | POA: Diagnosis not present

## 2021-01-30 DIAGNOSIS — D509 Iron deficiency anemia, unspecified: Secondary | ICD-10-CM | POA: Diagnosis not present

## 2021-01-30 LAB — GLUCOSE, CAPILLARY
Glucose-Capillary: 136 mg/dL — ABNORMAL HIGH (ref 70–99)
Glucose-Capillary: 271 mg/dL — ABNORMAL HIGH (ref 70–99)
Glucose-Capillary: 123 mg/dL — ABNORMAL HIGH (ref 70–99)
Glucose-Capillary: 123 mg/dL — ABNORMAL HIGH (ref 70–99)

## 2021-01-30 LAB — HEMOGLOBIN AND HEMATOCRIT, BLOOD
HCT: 28.3 % — ABNORMAL LOW (ref 36.0–46.0)
Hemoglobin: 8.5 g/dL — ABNORMAL LOW (ref 12.0–15.0)

## 2021-01-30 LAB — D-DIMER, QUANTITATIVE: D-Dimer, Quant: 2 ug/mL-FEU — ABNORMAL HIGH (ref 0.00–0.50)

## 2021-01-30 MED ORDER — SODIUM CHLORIDE 0.9 % IV SOLN
510.0000 mg | Freq: Once | INTRAVENOUS | Status: AC
  Administered 2021-01-30: 510 mg via INTRAVENOUS
  Filled 2021-01-30: qty 510

## 2021-01-30 NOTE — Progress Notes (Signed)
PROGRESS NOTE    [name] [name]    Code Status: Full Code  VOJ:[id number] DOB: 09/25/1951 DOA: 01/24/2021 LOS: 0 days  PCP: [name], MD CC:  Chief Complaint  Patient presents with  . Rectal Bleeding  . Abnormal Lab       Hospital Summary   This is a 70 year old female with a history of CKD 5 (not on HD, follows with Dr. [name]), chronic combined systolic and diastolic CHF, COPD, GERD, IDDM, HTN, chronic iron deficiency anemia and anemia of CKD who gets regular IV iron infusions and EPO injections who was sent to the ED on 4/19 from the [place] office due to concern for GI bleeding and symptomatic anemia (Hb 7.5) with a syncopal episode several weeks ago and fatigue. She also reported a left buttock boil that recently ruptured.   ED Course: Afebrile, hypertensive, in no distress. +FOBT without gross bleeding. Hgb 7.7g/dl. Metabolic panel shows hypokalemia and derangements near the patient's baseline including BUN 168, Cr 3.03. Admission requested, GI consulted. She received 1 u PRBCs upon admission due to symptomatic anemia.   4/20 EGD: Erosive gastritis with flecks of old blood that look like cameron erosions though no significant hiatal hernia. Normal ampulla, duodenal bulb, first and second portion of the duodenum, major papilla, area of the papilla and third portion of the duodenum. Hematin in the stomach and chronic gastritis otherwise normal. No specimens collected.  4/25: Due for her outpatient Feraheme. Will give inpatient x 1  Currently waiting on SNF availability  A & P   Principal Problem:   Symptomatic anemia Active Problems:   PAD (peripheral artery disease) (HCC)   CKD (chronic kidney disease), stage IV (HCC)   Morbid obesity (HCC)   Hyponatremia   Cellulitis and abscess of buttock   Acute blood loss anemia   HTN (hypertension)   Hyperlipidemia   Hypothyroidism   DM (diabetes mellitus), type 2 with renal complications (HCC)   Atherosclerosis of native  arteries of extremities with rest pain, unspecified extremity (HCC)   Chronic CHF (congestive heart failure) ([place])   1. Acute on chronic symptomatic anemia suspected secondary to GI bleed with history of anemia of CKD and iron deficiency anemia s/p 1 unit PRBCs, stable a. Received EPO 4/18 b. Due for outpatient IV Iron today - will provide inpatient x1 c. EGD 4/20: erosive gastritis d. Plavix restarted 4/23 and hemoglobin remains stable e. Protonix 40 mg BID x 2 months then back to QD  2. RLE swelling a. Not noted on prior exams but patient reports this is a chronic issue and even says that today it looks improved b. Not on pharmacologic anticoagulation due to #1 - > D dimer  3. Cellulitis of buttock, resolved  a. Afebrile and resolved leukocytosis b. Completed 6 days doxycycline  4. Leg cramping/right shoulder pain, likely musculoskeletal, resolved a. Using home topical spray b. On percocet and oxycodone  5. Hypokalemia, resolved  6. Hypomagnesemia, resolved  7. Chronic combined systolic/diastolic CHF, not in acute exacerbation a. Continue home torsemide, metolazone MWF and coreg  8. COPD, not in acute exacerbation a. Continue Dulera  9. Constipation a. No BM with senna and MiraLAX b. BM yesterday following glycerin suppository and SMOG enema  10. IDDM a. Holding insulin 75/25 b. HbA1c 6.5 in March c. Continue HS and sliding scale added on Lantus and carb modified diet d. Continue home regimen at DC  11. Peripheral vascular disease a. plavix as above  12. Chronic  pain a. continue home meds and bowel regimen  13. Hypothyroidism a. Continue synthroid  14. Recent syncopal episode  Generalized weakness a. Occurred a few weeks ago and sounds like a vasovagal episode at home. Had unremarkable head imaging at the time b. Has bilateral knee ecchymosis and left black eye c. PT recommending SNF  15. Ingrown hair of the vagina a. Warm compresses  DVT prophylaxis:  SCDs Start: 01/24/21 1744   Family Communication: friend at bedside  Disposition Plan: Medically stable for discharge, currently waiting on SNF availability, hopefully discharge tomorrow once bed available Status is: Observation  The patient remains OBS appropriate   Dispo: The patient is from: Home              Anticipated d/c is to: SNF              Patient currently is medically stable to d/c.   Difficult to place patient No                 Pressure injury documentation    None  Consultants  GI  Procedures  4/20: EGD 1 unit PRBCs 4/25: Feraheme x 1  Antibiotics   Anti-infectives (From admission, onward)   Start     Dose/Rate Route Frequency Ordered Stop   01/26/21 0000  doxycycline (VIBRA-TABS) 100 MG tablet        100 mg Oral Every 12 hours 01/26/21 1224 01/29/21 2359   01/24/21 2200  doxycycline (VIBRA-TABS) tablet 100 mg        100 mg Oral Every 12 hours 01/24/21 1703          Subjective   Patient seen and examined at bedside no acute distress and resting comfortably.  No events overnight.  Tolerating diet. Had a BM yesterday finally and feels better without blood. Says that she has RLE swelling but this is chronic and even looks better today than usual.  Denies any chest pain, shortness of breath, fever, nausea, vomiting, urinary complaints. Otherwise ROS negative   Objective   Vitals:   01/29/21 2210 01/30/21 0518 01/30/21 0843 01/30/21 1231  BP: 138/69 132/74  (!) 141/79  Pulse: 79 80  82  Resp: 19 18  16   Temp: 98.4 F (36.9 C) (!) 97.5 F (36.4 C)  98 F (36.7 C)  TempSrc: Oral Oral  Oral  SpO2: 94% 98% 97% 100%  Weight:      Height:        Intake/Output Summary (Last 24 hours) at 01/30/2021 1452 Last data filed at 01/30/2021 0813 Gross per 24 hour  Intake 270 ml  Output 150 ml  Net 120 ml   Filed Weights   01/24/21 1223 01/24/21 1818  Weight: 90.7 kg 96 kg    Examination:  Physical Exam Vitals and nursing note  reviewed.  Constitutional:      Appearance: Normal appearance.  HENT:     Head: Normocephalic and atraumatic.  Eyes:     Conjunctiva/sclera: Conjunctivae normal.  Cardiovascular:     Rate and Rhythm: Normal rate and regular rhythm.  Pulmonary:     Effort: Pulmonary effort is normal.     Breath sounds: Normal breath sounds.  Abdominal:     General: Abdomen is flat.     Palpations: Abdomen is soft.  Musculoskeletal:        General: No swelling or tenderness.     Comments: Nonpitting RLE edema  Skin:    Coloration: Skin is not jaundiced or pale.  Neurological:  Mental Status: She is alert. Mental status is at baseline.  Psychiatric:        Mood and Affect: Mood normal.        Behavior: Behavior normal.     Data Reviewed: I have personally reviewed following labs and imaging studies  CBC: Recent Labs  Lab 01/24/21 1426 01/25/21 0540 01/26/21 0528 01/28/21 0532 01/29/21 0539 01/30/21 0457  WBC 9.5 11.3* 10.3 10.0 10.7*  --   HGB 7.7* 7.8* 8.4* 8.3* 8.0* 8.5*  HCT 25.5* 25.1* 27.8* 27.7* 26.3* 28.3*  MCV 79.9* 80.7 82.2 82.7 80.4  --   PLT 395 362 418* 431* 441*  --    Basic Metabolic Panel: Recent Labs  Lab 01/24/21 1426 01/25/21 0540 01/26/21 0528  NA 135 132* 137  K 2.9* 3.3* 3.4*  CL 92* 91* 95*  CO2 29 26 27   GLUCOSE 148* 166* 208*  BUN 168* 172* 160*  CREATININE 3.03* 3.12* 3.10*  CALCIUM 9.9 9.4 9.6  MG  --  1.5* 1.9   GFR: Estimated Creatinine Clearance: 18.3 mL/min (A) (by C-G formula based on SCr of 3.1 mg/dL (H)). Liver Function Tests: Recent Labs  Lab 01/24/21 1426  AST 21  ALT 18  ALKPHOS 61  BILITOT 0.6  PROT 7.3  ALBUMIN 3.2*   No results for input(s): LIPASE, AMYLASE in the last 168 hours. No results for input(s): AMMONIA in the last 168 hours. Coagulation Profile: No results for input(s): INR, PROTIME in the last 168 hours. Cardiac Enzymes: No results for input(s): CKTOTAL, CKMB, CKMBINDEX, TROPONINI in the last 168  hours. BNP (last 3 results) No results for input(s): PROBNP in the last 8760 hours. HbA1C: No results for input(s): HGBA1C in the last 72 hours. CBG: Recent Labs  Lab 01/29/21 1145 01/29/21 1656 01/29/21 2144 01/30/21 0736 01/30/21 1147  GLUCAP 204* 125* 232* 136* 271*   Lipid Profile: No results for input(s): CHOL, HDL, LDLCALC, TRIG, CHOLHDL, LDLDIRECT in the last 72 hours. Thyroid Function Tests: No results for input(s): TSH, T4TOTAL, FREET4, T3FREE, THYROIDAB in the last 72 hours. Anemia Panel: No results for input(s): VITAMINB12, FOLATE, FERRITIN, TIBC, IRON, RETICCTPCT in the last 72 hours. Sepsis Labs: No results for input(s): PROCALCITON, LATICACIDVEN in the last 168 hours.  Recent Results (from the past 240 hour(s))  Resp Panel by RT-PCR (Flu A&B, Covid) Nasopharyngeal Swab     Status: None   Collection Time: 01/24/21  2:27 PM   Specimen: Nasopharyngeal Swab; Nasopharyngeal(NP) swabs in vial transport medium  Result Value Ref Range Status   SARS Coronavirus 2 by RT PCR NEGATIVE NEGATIVE Final    Comment: (NOTE) SARS-CoV-2 target nucleic acids are NOT DETECTED.  The SARS-CoV-2 RNA is generally detectable in upper respiratory specimens during the acute phase of infection. The lowest concentration of SARS-CoV-2 viral copies this assay can detect is 138 copies/mL. A negative result does not preclude SARS-Cov-2 infection and should not be used as the sole basis for treatment or other patient management decisions. A negative result may occur with  improper specimen collection/handling, submission of specimen other than nasopharyngeal swab, presence of viral mutation(s) within the areas targeted by this assay, and inadequate number of viral copies(<138 copies/mL). A negative result must be combined with clinical observations, patient history, and epidemiological information. The expected result is Negative.  Fact Sheet for Patients:   EntrepreneurPulse.com.au  Fact Sheet for Healthcare Providers:  IncredibleEmployment.be  This test is no t yet approved or cleared by the [place] and  has been authorized for detection and/or diagnosis of SARS-CoV-2 by FDA under an Emergency Use Authorization (EUA). This EUA will remain  in effect (meaning this test can be used) for the duration of the COVID-19 declaration under Section 564(b)(1) of the Act, 21 U.S.C.section 360bbb-3(b)(1), unless the authorization is terminated  or revoked sooner.       Influenza A by PCR NEGATIVE NEGATIVE Final   Influenza B by PCR NEGATIVE NEGATIVE Final    Comment: (NOTE) The Xpert Xpress SARS-CoV-2/FLU/RSV plus assay is intended as an aid in the diagnosis of influenza from Nasopharyngeal swab specimens and should not be used as a sole basis for treatment. Nasal washings and aspirates are unacceptable for Xpert Xpress SARS-CoV-2/FLU/RSV testing.  Fact Sheet for Patients: EntrepreneurPulse.com.au  Fact Sheet for Healthcare Providers: IncredibleEmployment.be  This test is not yet approved or cleared by the [place] FDA and has been authorized for detection and/or diagnosis of SARS-CoV-2 by FDA under an Emergency Use Authorization (EUA). This EUA will remain in effect (meaning this test can be used) for the duration of the COVID-19 declaration under Section 564(b)(1) of the Act, 21 U.S.C. section 360bbb-3(b)(1), unless the authorization is terminated or revoked.  Performed at [facility], [place] [address], [place], [place] [address]          Radiology Studies: No results found.      Scheduled Meds: . allopurinol  100 mg Oral Daily  . carvedilol  12.5 mg Oral BID WC  . cinacalcet  30 mg Oral Q supper  . clopidogrel  75 mg Oral Daily  . doxycycline  100 mg Oral Q12H  . insulin aspart  0-20 Units Subcutaneous TID WC  .  insulin aspart  0-5 Units Subcutaneous QHS  . insulin glargine  10 Units Subcutaneous Daily  . levothyroxine  150 mcg Oral Q0600  . metolazone  5 mg Oral Q M,W,F  . mirabegron ER  50 mg Oral Daily  . mometasone-formoterol  2 puff Inhalation BID  . pantoprazole  40 mg Oral BID  . sodium chloride flush  3 mL Intravenous Q12H  . torsemide  100 mg Oral BID   Continuous Infusions:   Time spent: 28 minutes with over 50% of the time coordinating the patient's care    Harold Hedge, DO [facility]   Call night coverage person covering after 7pm

## 2021-01-30 NOTE — TOC Progression Note (Signed)
Transition of Care [facility]) - Progression Note    Patient Details  Name: VELMER BROADFOOT MRN: [id number] Date of Birth: 05-05-1951  [facility]) CM/SW Contact  Henritta Mutz, Marjie Skiff, RN Phone Number: 01/30/2021, 1:35 PM  Clinical Narrative:    [name] called to request updated PT note. New PT note faxed to [phone]. Still awaiting auth. Spoke with pt at bedside who states that her and her family have chosen [facility]. [facility] liaison contacted to accept bed.   Expected Discharge Plan: [place] Barriers to Discharge: Continued Medical Work up  Expected Discharge Plan and Services Expected Discharge Plan: [place] In-house Referral: Clinical Social Work     Living arrangements for the past 2 months: Apartment Expected Discharge Date: 01/26/21                                     Social Determinants of Health (SDOH) Interventions    Readmission Risk Interventions Readmission Risk Prevention Plan 01/14/2020  Transportation Screening Complete  Medication Review Press photographer) Complete  PCP or Specialist appointment within 3-5 days of discharge Complete  HRI or Home Care Consult Complete  SW Recovery Care/Counseling Consult Complete  Palliative Care Screening Not [place] Complete  Some recent data might be hidden

## 2021-01-30 NOTE — Progress Notes (Signed)
Physical Therapy Treatment Patient Details Name: [name] [name] MRN: [id number] DOB: Feb 26, 1951 Today's Date: 01/30/2021    History of Present Illness [name] [name] is a 70 y.o. female was sent to the ED from [facility] GI office due to GI bleeding and decreased hemoglobin to 7.5. In ED, hemoglobin 7.7 and +FOBT. PMH: CKD with AVF not on HD, CHF, COPD, GERD, diabetes, HTN, TIA, and chrnoic anemia of iron deficiency and of CKD on IV iron infusions and EPO injections regularly with history of transfusion required    PT Comments    Pt OOB in recliner.  General transfer comment: Min Assist with forward momentum to rise from recliner with increased time and effort.  Assisted to [facility].  Unsteady with turns and back steps.  Decreased weight shift r foot due to pain/tenderness.  Assisted with peri care after void as pt was unable due to weakness/balance.General Gait Details: Unsteady gait with limited distance due to weakness, decreased weight shift R foot (pain/tenderness/edema) unsteady esp with turns.  Tendancy to push walker too far to front.  VC's for correction.  Recliner following for safety.  Fatigues.  HIGH FALL RISK. Pt will need ST Rehab at SNF prior to returning home.   Follow Up Recommendations  SNF     Equipment Recommendations  None recommended by PT    Recommendations for Other Services       Precautions / Restrictions Precautions Precautions: Fall Precaution Comments: R foot tenderness    Mobility  Bed Mobility               General bed mobility comments: OOB in recliner    Transfers Overall transfer level: Needs assistance Equipment used: Rolling walker (2 wheeled) Transfers: Sit to/from [facility] Sit to Stand: Min assist Stand pivot transfers: Min assist;Mod assist       General transfer comment: Min Assist with forward momentum to rise from recliner with increased time and effort.  Assisted to [facility].  Unsteady with turns and back steps.   Decreased weight shift r foot due to pain/tenderness.  Assisted with peri care after void as pt was unable due to weakness/balance.  Ambulation/Gait Ambulation/Gait assistance: Min assist Gait Distance (Feet): 18 Feet Assistive device: Rolling walker (2 wheeled) Gait Pattern/deviations: Step-to pattern;Decreased stride length;Decreased weight shift to right;Trunk flexed;Wide base of support Gait velocity: decreased   General Gait Details: Unsteady gait with limited distance due to weakness, decreased weight shift R foot (pain/tenderness/edema) unsteady esp with turns.  Tendancy to push walker too far to front.  VC's for correction.  Recliner following for safety.  Fatigues.  HIGH FALL RISK.   Stairs             Wheelchair Mobility    Modified Rankin (Stroke Patients Only)       Balance                                            Cognition Arousal/Alertness: Awake/alert Behavior During Therapy: WFL for tasks assessed/performed Overall Cognitive Status: Within Functional Limits for tasks assessed                                 General Comments: AxO x 3 very pleasant      Exercises      General Comments  Pertinent Vitals/Pain Pain Assessment: Faces Faces Pain Scale: Hurts a little bit Pain Location: low back Pain Descriptors / Indicators: Throbbing;Aching;Sore Pain Intervention(s): Monitored during session;Repositioned    Home Living                      Prior Function            PT Goals (current goals can now be found in the care plan section) Progress towards PT goals: Progressing toward goals    Frequency    Min 3X/week      PT Plan Current plan remains appropriate    Co-evaluation              AM-PAC PT "6 Clicks" Mobility   Outcome Measure  Help needed turning from your back to your side while in a flat bed without using bedrails?: A Little Help needed moving from lying on your back  to sitting on the side of a flat bed without using bedrails?: A Little Help needed moving to and from a bed to a chair (including a wheelchair)?: A Little Help needed standing up from a chair using your arms (e.g., wheelchair or bedside chair)?: A Little Help needed to walk in hospital room?: A Little Help needed climbing 3-5 steps with a railing? : A Lot 6 Click Score: 17    End of Session Equipment Utilized During Treatment: Gait belt Activity Tolerance: Patient limited by pain;Patient limited by fatigue Patient left: in chair;with call bell/phone within reach;with chair alarm set;with nursing/sitter in room Nurse Communication: Mobility status PT Visit Diagnosis: Unsteadiness on feet (R26.81);Other abnormalities of gait and mobility (R26.89);Muscle weakness (generalized) (M62.81)     Time:  - 11:58 - 12:22    Charges:   1  Gt    1 ta                     Rica Koyanagi  PTA Acute  Rehabilitation Services Pager      [phone] Office      [phone]

## 2021-01-31 ENCOUNTER — Observation Stay (HOSPITAL_BASED_OUTPATIENT_CLINIC_OR_DEPARTMENT_OTHER): Payer: Medicare Other

## 2021-01-31 DIAGNOSIS — D649 Anemia, unspecified: Secondary | ICD-10-CM | POA: Diagnosis not present

## 2021-01-31 DIAGNOSIS — R609 Edema, unspecified: Secondary | ICD-10-CM

## 2021-01-31 DIAGNOSIS — D509 Iron deficiency anemia, unspecified: Secondary | ICD-10-CM | POA: Diagnosis not present

## 2021-01-31 LAB — GLUCOSE, CAPILLARY
Glucose-Capillary: 126 mg/dL — ABNORMAL HIGH (ref 70–99)
Glucose-Capillary: 189 mg/dL — ABNORMAL HIGH (ref 70–99)
Glucose-Capillary: 195 mg/dL — ABNORMAL HIGH (ref 70–99)

## 2021-01-31 LAB — RESP PANEL BY RT-PCR (FLU A&B, COVID) ARPGX2
Influenza A by PCR: NEGATIVE
Influenza B by PCR: NEGATIVE
SARS Coronavirus 2 by RT PCR: NEGATIVE

## 2021-01-31 MED ORDER — PANTOPRAZOLE SODIUM 40 MG PO TBEC: DELAYED_RELEASE_TABLET | ORAL | 0 refills | Status: DC

## 2021-01-31 NOTE — TOC Transition Note (Signed)
Transition of Care [facility]) - CM/SW Discharge Note   Patient Details  Name: [name] [name] MRN: [id number] Date of Birth: July 07, 1951  Transition of Care [facility]) CM/SW Contact:  Lynnell Catalan, RN Phone Number: 01/31/2021, 2:09 PM   Clinical Narrative:    [name] has approved SNF auth. Ref # [id number]. Pt to dc to [facility] today. [facility] contacted for transport. RN to call report to [phone].   Final next level of care: Skilled Nursing Facility Barriers to Discharge: No Barriers Identified   Patient Goals and CMS Choice Patient states their goals for this hospitalization and ongoing recovery are:: "I want to be able to get up and walk and drive myself where I need to go" CMS Medicare.gov Compare Post Acute Care list provided to:: Patient Choice offered to / list presented to : Patient  Discharge Placement              Patient chooses bed at: Other - please specify in the comment section below: [name]) Patient to be transferred to facility by: [facility]      Discharge Plan and Services In-house Referral: Clinical Social Work                                   Social Determinants of Health (SDOH) Interventions     Readmission Risk Interventions Readmission Risk Prevention Plan 01/14/2020  Transportation Screening Complete  Medication Review Press photographer) Complete  PCP or Specialist appointment within 3-5 days of discharge Complete  HRI or [place] Complete  SW Recovery Care/Counseling Consult Complete  Palliative Care Screening Not [place] Complete  Some recent data might be hidden

## 2021-01-31 NOTE — Progress Notes (Signed)
Chaplain engaged in an initial visit with [name] [name].  She shared that she feels like her independence has been taken away.  It has been hard for her to receive help from others.  She said that receiving so much help makes her feel like she is old or an invalid.  Chaplain and [name] [name] discussed what has brought her into the hospital and the need for some assistance.  Chaplain affirmed [name] [name]'s requests to be independent while also acknowledging what it means to receive help.    [name] [name] also shared about her upbringing.  [name] [name]'s parents died when she was a young girl and she was raised by her older sister.  [name] [name] presented a gratefulness for her sister while honoring that she has had some difficult experiences that could have changed the course of her life.  [name] [name] for allowing her to experience joy and laughter even through the difficult times in her life.  Chaplain affirmed the joy that [name] [name] has even while she explained the difficulty of being in the hospital and her past.  Chaplain offered listening, presence, and prayer.  Chaplain is available to follow-up.    01/31/21 1500  Clinical Encounter Type  Visited With Patient  Visit Type Initial

## 2021-01-31 NOTE — Progress Notes (Signed)
Bilateral lower extremity venous duplex has been completed. Preliminary results can be found in CV Proc through chart review.   01/31/21 11:41 AM Carlos Levering RVT

## 2021-02-06 ENCOUNTER — Encounter (HOSPITAL_COMMUNITY): Payer: Medicare Other

## 2021-02-09 NOTE — ED Provider Notes (Signed)
[facility] Provider Note   CSN: [id number] Arrival date & time: 01/24/21  1205     History Chief Complaint  Patient presents with  . Rectal Bleeding  . Abnormal Lab    [name] [name] is a 70 y.o. female.  HPI     70 y.o. 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  Patient has required blood transfusion in the past.  She reports that over the last few days she has been having generalized weakness and went to her PCP, who advised patient to come to the ER after hemoglobin was noted to be less than 8.  Patient does indicate having some dark stools, but not tarry.  Review of system is also positive for abdominal discomfort that is nonspecific.  No bright red blood per rectum.  Denies any recent endoscopy.  Past Medical History:  Diagnosis Date  . Anemia   . Arthritis   . Asthma   . Bronchitis   . CHF (congestive heart failure) ([place])   . Chronic kidney disease   . Chronic kidney disease   . Chronic pain syndrome   . COPD (chronic obstructive pulmonary disease) ([place])   . Cramping of feet   . Diabetes mellitus   . Diabetes mellitus without complication ([place])   . Dyspnea   . Full dentures   . GERD (gastroesophageal reflux disease)   . Gout   . Headache   . History of hiatal hernia   . History of hip replacement, total   . History of transfusion   . Hx MRSA infection    abscess left groin  . Hx of cardiac cath 06/2014  . Hyperlipidemia   . Hypertension   . Hypothyroid   . 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 Morbidly obese ([place])   . Peripheral vascular disease ([place])   . Positive cardiac stress test 12/2013   anterior and lateral ischemia on Myoview  . Sleep apnea    UNABLE TO TOLERATE C PAP  . Stress incontinence   . Thyroid disease   . TIA (transient ischemic attack)     X2 NO RESIDUAL PROBLEMS  . Wears glasses      Patient Active Problem List   Diagnosis Date Noted  . Symptomatic anemia 01/24/2021  . Anemia, unspecified 01/08/2020  . Bacteremia 01/07/2020  . UTI (urinary tract infection) 01/06/2020  . Anemia 10/26/2019  . S/P lumbar fusion 09/18/2019  . Chronic CHF (congestive heart failure) ([place]) 03/16/2019  . Leukocytosis 03/16/2019  . Chest pain 11/27/2018  . Pre-syncope 11/27/2018  . Epigastric abdominal pain 11/27/2018  . Solitary pulmonary nodule 11/06/2018  . 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 CHF (congestive heart failure), NYHA class III, acute on chronic, systolic ([place]) 40/81/4481  . 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, unspecified extremity ([place]) 04/19/2016  . Cellulitis of abdominal wall 03/21/2015  . Acute on chronic renal failure ([place]) 03/21/2015  . DM (diabetes mellitus), type 2 with renal complications ([place]) 85/63/1497  . Abdominal wall abscess 03/21/2015  . 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 Low urine output 08/11/2014  . Acute on chronic systolic CHF (congestive heart failure), NYHA class 3 ([place]) 08/11/2014  . Asthma, chronic 08/11/2014  . Hypothyroidism 08/11/2014  .  OSA (obstructive sleep apnea) 08/11/2014  . Positive cardiac stress test 06/29/2014  . Hyperlipidemia 02/12/2014  . Left arm weakness 12/23/2013  . TIA (transient ischemic attack) 12/23/2013  . Left buttock abscess 10/12/2013  . Cellulitis and abscess of leg, right 04/16/2013  . Acute blood loss anemia 02/14/2013  . HTN (hypertension) 02/14/2013  . Cellulitis and abscess of buttock 02/09/2013  . Morbid obesity ([place]) 02/03/2013  . Diarrhea 02/03/2013  . Hyponatremia 02/03/2013  . DM (diabetes mellitus) ([place]) 02/02/2013  . CKD (chronic kidney disease), stage IV ([place]) 02/02/2013  . Cellulitis 02/02/2013  . PAD (peripheral artery disease) ([place]) 04/26/2011  . Tobacco  abuse 04/26/2011    Past Surgical History:  Procedure Laterality Date  . ABDOMINAL HYSTERECTOMY    . BACK SURGERY  2020  . BASCILIC VEIN TRANSPOSITION Left 04/05/2020   Procedure: BASILIC VEIN TRANSPOSITION FIRST STAGE;  Surgeon: Angelia Mould, MD;  Location: [facility];  Service: Vascular;  Laterality: Left;  . COLON SURGERY  1995   DUE TO POLYP  . DILATION AND CURETTAGE OF UTERUS    . ESOPHAGOGASTRODUODENOSCOPY N/A 01/25/2021   Procedure: ESOPHAGOGASTRODUODENOSCOPY (EGD);  Surgeon: Clarene Essex, MD;  Location: [facility];  Service: Endoscopy;  Laterality: N/A;  . FOREARM FRACTURE SURGERY     Left arm  . HERNIA REPAIR     w/ mesh  . INCISION AND DRAINAGE ABSCESS Left 02/04/2013   Procedure: INCISION AND DRAINAGE LEFT BUTTOCK ABSCESS; INCISION AND DRAINAGE LEFT BREAST ABSCESS;  Surgeon: Harl Bowie, MD;  Location: [facility];  Service: General;  Laterality: Left;  . INCISION AND DRAINAGE ABSCESS N/A 02/12/2013   Procedure: INCISION AND DEBRIDEMENT BUTTOCK WOUND ;  Surgeon: [name]. Georgette Dover, MD;  Location: [facility];  Service: General;  Laterality: N/A;  . INCISION AND DRAINAGE ABSCESS N/A 02/14/2013   Procedure: INCISION AND DRAINAGE/DRESSING CHANGE;  Surgeon: Harl Bowie, MD;  Location: [facility];  Service: General;  Laterality: N/A;  . INCISION AND DRAINAGE ABSCESS N/A 03/21/2015   Procedure: INCISION AND DRAINAGE PUBIC ABSCESS;  Surgeon: Excell Seltzer, MD;  Location: [facility];  Service: General;  Laterality: N/A;  . INCISION AND DRAINAGE PERIRECTAL ABSCESS Left 02/10/2013   Procedure: IRRIGATION AND DEBRIDEMENT OF BUTTOCK/PERINEAL ABSCESS;  Surgeon: [name]. Georgette Dover, MD;  Location: [facility];  Service: General;  Laterality: Left;  . INCISION AND DRAINAGE PERIRECTAL ABSCESS N/A 02/16/2013   Procedure: IRRIGATION AND DEBRIDEMENT PERINEAL ABSCESS;  Surgeon: Zenovia Jarred, MD;  Location: [facility];  Service: General;  Laterality: N/A;  . IRRIGATION AND DEBRIDEMENT ABSCESS N/A 02/06/2013    Procedure: IRRIGATION AND DEBRIDEMENT BUTTOCK ABSCESS AND DRESSING CHANGE;  Surgeon: Harl Bowie, MD;  Location: [facility];  Service: General;  Laterality: N/A;  . IRRIGATION AND DEBRIDEMENT ABSCESS Left 02/08/2013   Procedure: IRRIGATION AND DEBRIDEMENT ABSCESS/DRESSING CHANGE;  Surgeon: Gwenyth Ober, MD;  Location: [facility];  Service: General;  Laterality: Left;  . JOINT REPLACEMENT  2010 / 2012   . LAPAROSCOPIC CHOLECYSTECTOMY    . LEFT HEART CATHETERIZATION WITH CORONARY ANGIOGRAM N/A 07/02/2014   Procedure: LEFT HEART CATHETERIZATION WITH CORONARY ANGIOGRAM;  Surgeon: Lorretta Harp, MD;  Location: [facility];  Service: Cardiovascular;  Laterality: N/A;  . LOWER EXTREMITY ANGIOGRAM Right 04/20/2016   Procedure: Lower Extremity Angiogram;  Surgeon: Elam Dutch, MD;  Location: [facility];  Service: Cardiovascular;  Laterality: Right;  . MULTIPLE TOOTH EXTRACTIONS    . PERIPHERAL VASCULAR CATHETERIZATION N/A 04/20/2016   Procedure:  Abdominal Aortogram;  Surgeon: Elam Dutch, MD;  Location: [facility];  Service: Cardiovascular;  Laterality: N/A;  . PERIPHERAL VASCULAR CATHETERIZATION Right 04/20/2016   Procedure: Peripheral Vascular Intervention;  Surgeon: Elam Dutch, MD;  Location: [facility];  Service: Cardiovascular;  Laterality: Right;  popiteal  . RIGHT HEART CATH N/A 10/27/2018   Procedure: RIGHT HEART CATH;  Surgeon: Larey Dresser, MD;  Location: [facility];  Service: Cardiovascular;  Laterality: N/A;  . RIGHT HEART CATH N/A 03/20/2019   Procedure: RIGHT HEART CATH;  Surgeon: Larey Dresser, MD;  Location: [facility];  Service: Cardiovascular;  Laterality: N/A;  . THYROIDECTOMY    . TOTAL HIP REVISION Right 08/11/2014   Procedure: RIGHT ACETABULAR REVISION;  Surgeon: Gearlean Alf, MD;  Location: [facility];  Service: Orthopedics;  Laterality: Right;  [facility] VASCULAR SURGERY       OB History   No obstetric history on file.      Family History  Problem Relation Age of Onset  . Diabetes Brother   . Cardiomyopathy Mother   . Heart disease Mother   . Cancer Father     Social History   Tobacco Use  . Smoking status: Current Some Day Smoker    Packs/day: 0.25    Years: 40.00    Pack years: 10.00    Types: Cigarettes  . Smokeless tobacco: Never Used  Vaping Use  . Vaping Use: Never used  Substance Use Topics  . Alcohol use: Yes    Alcohol/week: 1.0 standard drink    Types: 1 Standard drinks or equivalent per week    Comment: OCC  . Drug use: No    Home Medications Prior to Admission medications   Medication Sig Start Date End Date Taking? Authorizing Provider  albuterol (PROVENTIL) (2.5 MG/3ML) 0.083% nebulizer solution Take 2.5 mg by nebulization every 6 (six) hours as needed for wheezing or shortness of breath.   Yes [provider]  albuterol (VENTOLIN HFA) 108 (90 Base) MCG/ACT inhaler Inhale 2 puffs into the lungs 4 (four) times daily as needed for wheezing or shortness of breath.   Yes [provider]  allopurinol (ZYLOPRIM) 100 MG tablet Take 100 mg by mouth daily. 09/14/20  Yes [provider]  B Complex Vitamins (VITAMIN B COMPLEX PO) Take 1 capsule by mouth daily.   Yes [provider]  budesonide-formoterol (SYMBICORT) 160-4.5 MCG/ACT inhaler Inhale 2 puffs into the lungs 2 (two) times daily as needed (for SOB).    Yes [provider]  carboxymethylcellulose (REFRESH PLUS) 0.5 % SOLN Place 1 drop into both eyes daily as needed (dry eyes).   Yes [provider]  carvedilol (COREG) 12.5 MG tablet Take 12.5 mg by mouth 2 (two) times daily. 01/01/21  Yes [provider]  cholecalciferol (VITAMIN D3) 25 MCG (1000 UNIT) tablet Take 1,000 Units by mouth daily.   Yes [provider]  cinacalcet (SENSIPAR) 30 MG tablet Take 30 mg by mouth daily. 12/08/20  Yes [provider]  clopidogrel (PLAVIX) 75 MG tablet TAKE 1 TABLET  BY MOUTH EVERY DAY Patient taking differently: Take 75 mg by mouth daily. 06/18/17  Yes Waynetta Sandy, MD  diclofenac Sodium (VOLTAREN) 1 % GEL Apply 1 application topically 4 (four) times daily as needed (pain). 08/15/20  Yes [provider]  diphenhydrAMINE (BENADRYL) 25 MG tablet Take 25 mg by mouth every 6 (six) hours as needed for allergies.  Yes [provider]  Dulaglutide (TRULICITY) 1.5 NL/9.7QB SOPN Inject 1.5 mg into the skin every Monday.   Yes [provider]  fluticasone (FLONASE) 50 MCG/ACT nasal spray Place 2 sprays into the nose as needed for allergies.    Yes [provider]  hydrOXYzine (ATARAX/VISTARIL) 25 MG tablet Take 25 mg by mouth 2 (two) times daily as needed for anxiety. 12/08/20  Yes [provider]  insulin lispro protamine-lispro (HUMALOG 75/25 MIX) (75-25) 100 UNIT/ML SUSP injection Inject 30-40 Units into the skin See admin instructions. Inject 40 units into the skin morning and 30 units in the evening   Yes [provider]  levothyroxine (SYNTHROID) 150 MCG tablet Take 150 mcg by mouth daily. 12/10/20  Yes [provider]  magnesium oxide (MAG-OX) 400 (241.3 Mg) MG tablet Take 1 tablet (400 mg total) by mouth 2 (two) times daily. 01/14/20  Yes Lavina Hamman, MD  metolazone (ZAROXOLYN) 5 MG tablet Take 5 mg by mouth See admin instructions. Takes 1 tablet on Monday, Wednesday and Friday 12/06/20  Yes [provider]  Multiple Vitamin (MULTIVITAMIN WITH MINERALS) TABS tablet Take 1 tablet by mouth daily.   Yes [provider]  MYRBETRIQ 50 MG TB24 tablet Take 50 mg by mouth daily. 01/03/21  Yes [provider]  oxyCODONE-acetaminophen (PERCOCET) 10-325 MG tablet Take 1 tablet by mouth every 6 (six) hours as needed for pain. Patient taking differently: Take 1 tablet by mouth every 6 (six) hours. 04/05/20  Yes Dagoberto Ligas, PA-C  polyvinyl alcohol (LIQUIFILM TEARS) 1.4 %  ophthalmic solution Place 2 drops into both eyes daily as needed for dry eyes.   Yes [provider]  Potassium Chloride ER 20 MEQ TBCR Take 1 tablet by mouth 3 (three) times a week. Tuesday, Thursdays and Saturdays 11/24/20  Yes [provider]  torsemide (DEMADEX) 100 MG tablet Take 100 mg by mouth 2 (two) times daily. 12/05/20  Yes [provider]  TRULICITY 1.5 HA/1.9FX SOPN Inject 1.5 mg into the skin once a week. Monday 11/11/20  Yes [provider]  metoCLOPramide (REGLAN) 5 MG tablet Take 5 mg by mouth 3 (three) times daily. 01/20/21   [provider]  pantoprazole (PROTONIX) 40 MG tablet Take 1 tablet (40 mg total) by mouth 2 (two) times daily before a meal for 60 days, THEN 1 tablet (40 mg total) daily. 01/31/21 05/31/21  Harold Hedge, MD    Allergies    Nebivolol, Ace inhibitors, Ace inhibitors, Morphine and related, and Morphine and related  Review of Systems   Review of Systems  Constitutional: Positive for activity change and fatigue.  Respiratory: Positive for shortness of breath.   Gastrointestinal: Positive for abdominal distention.  Neurological: Positive for dizziness.  Hematological: Does not bruise/bleed easily.  All other systems reviewed and are negative.   Physical Exam Updated Vital Signs BP 123/60 (BP Location: Left Arm)   Pulse 81   Temp 98 F (36.7 C) (Oral)   Resp 20   Ht 5\' 2"  (1.575 m)   Wt 96 kg   SpO2 100%   BMI 38.71 kg/m   Physical Exam Vitals and nursing note reviewed.  Constitutional:      Appearance: She is well-developed.  HENT:     Head: Normocephalic and atraumatic.  Eyes:     Pupils: Pupils are equal, round, and reactive to light.  Cardiovascular:     Rate and Rhythm: Normal rate and regular rhythm.  Heart sounds: Normal heart sounds. No murmur heard.   Pulmonary:     Effort: Pulmonary effort is normal. No respiratory distress.  Abdominal:     General: There is no distension.      Palpations: Abdomen is soft.     Tenderness: There is no abdominal tenderness. There is no guarding or rebound.  Musculoskeletal:     Cervical back: Neck supple.  Skin:    General: Skin is warm and dry.  Neurological:     Mental Status: She is alert and oriented to person, place, and time.     ED Results / Procedures / Treatments   Labs (all labs ordered are listed, but only abnormal results are displayed) Labs Reviewed  COMPREHENSIVE METABOLIC PANEL - Abnormal; Notable for the following components:      Result Value   Potassium 2.9 (*)    Chloride 92 (*)    Glucose, Bld 148 (*)    BUN 168 (*)    Creatinine, Ser 3.03 (*)    Albumin 3.2 (*)    GFR, Estimated 16 (*)    All other components within normal limits  CBC - Abnormal; Notable for the following components:   RBC 3.19 (*)    Hemoglobin 7.7 (*)    HCT 25.5 (*)    MCV 79.9 (*)    MCH 24.1 (*)    RDW 18.4 (*)    All other components within normal limits  BASIC METABOLIC PANEL - Abnormal; Notable for the following components:   Sodium 132 (*)    Potassium 3.3 (*)    Chloride 91 (*)    Glucose, Bld 166 (*)    BUN 172 (*)    Creatinine, Ser 3.12 (*)    GFR, Estimated 15 (*)    All other components within normal limits  CBC - Abnormal; Notable for the following components:   WBC 11.3 (*)    RBC 3.11 (*)    Hemoglobin 7.8 (*)    HCT 25.1 (*)    MCH 25.1 (*)    RDW 18.0 (*)    All other components within normal limits  GLUCOSE, CAPILLARY - Abnormal; Notable for the following components:   Glucose-Capillary 234 (*)    All other components within normal limits  MAGNESIUM - Abnormal; Notable for the following components:   Magnesium 1.5 (*)    All other components within normal limits  GLUCOSE, CAPILLARY - Abnormal; Notable for the following components:   Glucose-Capillary 167 (*)    All other components within normal limits  GLUCOSE, CAPILLARY - Abnormal; Notable for the following components:   Glucose-Capillary  149 (*)    All other components within normal limits  CBC - Abnormal; Notable for the following components:   RBC 3.38 (*)    Hemoglobin 8.4 (*)    HCT 27.8 (*)    MCH 24.9 (*)    RDW 18.1 (*)    Platelets 418 (*)    All other components within normal limits  BASIC METABOLIC PANEL - Abnormal; Notable for the following components:   Potassium 3.4 (*)    Chloride 95 (*)    Glucose, Bld 208 (*)    BUN 160 (*)    Creatinine, Ser 3.10 (*)    GFR, Estimated 16 (*)    All other components within normal limits  GLUCOSE, CAPILLARY - Abnormal; Notable for the following components:   Glucose-Capillary 147 (*)    All other components within normal limits  GLUCOSE, CAPILLARY -  Abnormal; Notable for the following components:   Glucose-Capillary 201 (*)    All other components within normal limits  GLUCOSE, CAPILLARY - Abnormal; Notable for the following components:   Glucose-Capillary 204 (*)    All other components within normal limits  GLUCOSE, CAPILLARY - Abnormal; Notable for the following components:   Glucose-Capillary 246 (*)    All other components within normal limits  GLUCOSE, CAPILLARY - Abnormal; Notable for the following components:   Glucose-Capillary 206 (*)    All other components within normal limits  GLUCOSE, CAPILLARY - Abnormal; Notable for the following components:   Glucose-Capillary 152 (*)    All other components within normal limits  GLUCOSE, CAPILLARY - Abnormal; Notable for the following components:   Glucose-Capillary 144 (*)    All other components within normal limits  GLUCOSE, CAPILLARY - Abnormal; Notable for the following components:   Glucose-Capillary 228 (*)    All other components within normal limits  GLUCOSE, CAPILLARY - Abnormal; Notable for the following components:   Glucose-Capillary 199 (*)    All other components within normal limits  CBC - Abnormal; Notable for the following components:   RBC 3.35 (*)    Hemoglobin 8.3 (*)    HCT 27.7  (*)    MCH 24.8 (*)    RDW 18.6 (*)    Platelets 431 (*)    All other components within normal limits  GLUCOSE, CAPILLARY - Abnormal; Notable for the following components:   Glucose-Capillary 193 (*)    All other components within normal limits  GLUCOSE, CAPILLARY - Abnormal; Notable for the following components:   Glucose-Capillary 151 (*)    All other components within normal limits  GLUCOSE, CAPILLARY - Abnormal; Notable for the following components:   Glucose-Capillary 218 (*)    All other components within normal limits  GLUCOSE, CAPILLARY - Abnormal; Notable for the following components:   Glucose-Capillary 199 (*)    All other components within normal limits  CBC - Abnormal; Notable for the following components:   WBC 10.7 (*)    RBC 3.27 (*)    Hemoglobin 8.0 (*)    HCT 26.3 (*)    MCH 24.5 (*)    RDW 18.5 (*)    Platelets 441 (*)    All other components within normal limits  GLUCOSE, CAPILLARY - Abnormal; Notable for the following components:   Glucose-Capillary 171 (*)    All other components within normal limits  GLUCOSE, CAPILLARY - Abnormal; Notable for the following components:   Glucose-Capillary 141 (*)    All other components within normal limits  GLUCOSE, CAPILLARY - Abnormal; Notable for the following components:   Glucose-Capillary 204 (*)    All other components within normal limits  GLUCOSE, CAPILLARY - Abnormal; Notable for the following components:   Glucose-Capillary 125 (*)    All other components within normal limits  HEMOGLOBIN AND HEMATOCRIT, BLOOD - Abnormal; Notable for the following components:   Hemoglobin 8.5 (*)    HCT 28.3 (*)    All other components within normal limits  GLUCOSE, CAPILLARY - Abnormal; Notable for the following components:   Glucose-Capillary 232 (*)    All other components within normal limits  GLUCOSE, CAPILLARY - Abnormal; Notable for the following components:   Glucose-Capillary 136 (*)    All other components  within normal limits  GLUCOSE, CAPILLARY - Abnormal; Notable for the following components:   Glucose-Capillary 271 (*)    All other components within normal limits  D-DIMER,  QUANTITATIVE - Abnormal; Notable for the following components:   D-Dimer, Quant 2.00 (*)    All other components within normal limits  GLUCOSE, CAPILLARY - Abnormal; Notable for the following components:   Glucose-Capillary 123 (*)    All other components within normal limits  GLUCOSE, CAPILLARY - Abnormal; Notable for the following components:   Glucose-Capillary 123 (*)    All other components within normal limits  GLUCOSE, CAPILLARY - Abnormal; Notable for the following components:   Glucose-Capillary 126 (*)    All other components within normal limits  GLUCOSE, CAPILLARY - Abnormal; Notable for the following components:   Glucose-Capillary 189 (*)    All other components within normal limits  GLUCOSE, CAPILLARY - Abnormal; Notable for the following components:   Glucose-Capillary 195 (*)    All other components within normal limits  POC OCCULT BLOOD, ED - Abnormal; Notable for the following components:   Fecal Occult Bld POSITIVE (*)    All other components within normal limits  RESP PANEL BY RT-PCR (FLU A&B, COVID) ARPGX2  RESP PANEL BY RT-PCR (FLU A&B, COVID) ARPGX2  HIV ANTIBODY (ROUTINE TESTING W REFLEX)  MAGNESIUM  TYPE AND SCREEN  PREPARE RBC (CROSSMATCH)    EKG None  Radiology No results found.  Procedures .Critical Care Performed by: Varney Biles, MD Authorized by: Varney Biles, MD   Critical care provider statement:    Critical care time (minutes):  36   Critical care was necessary to treat or prevent imminent or life-threatening deterioration of the following conditions: Symptomatic anemia.   Critical care was time spent personally by me on the following activities:  Discussions with consultants, evaluation of patient's response to treatment, examination of patient, ordering and  performing treatments and interventions, ordering and review of laboratory studies, ordering and review of radiographic studies, pulse oximetry, re-evaluation of patient's condition, obtaining history from patient or surrogate and review of old charts     Medications Ordered in ED Medications  0.9 %  sodium chloride infusion (Manually program via Guardrails IV Fluids) ( Intravenous New Bag/Given 01/24/21 1949)  potassium chloride SA (KLOR-CON) CR tablet 40 mEq (40 mEq Oral Given 01/24/21 2153)  oxyCODONE (Oxy IR/ROXICODONE) immediate release tablet 5 mg (5 mg Oral Given 01/26/21 1233)  magnesium sulfate IVPB 2 g 50 mL (2 g Intravenous New Bag/Given 01/25/21 0828)  potassium chloride SA (KLOR-CON) CR tablet 20 mEq (20 mEq Oral Given 01/25/21 1700)  potassium chloride SA (KLOR-CON) CR tablet 20 mEq (20 mEq Oral Given 01/26/21 1640)  senna (SENOKOT) tablet 8.6 mg (8.6 mg Oral Given 01/29/21 1012)  Glycerin (Adult) 2 g suppository 1 suppository (1 suppository Rectal Given 01/29/21 1357)  sorbitol, milk of mag, mineral oil, glycerin (SMOG) enema (960 mLs Rectal Given 01/29/21 1700)  ferumoxytol (FERAHEME) 510 mg in sodium chloride 0.9 % 100 mL IVPB (0 mg Intravenous Stopped 01/30/21 2133)    ED Course  I have reviewed the triage vital signs and the nursing notes.  Pertinent labs & imaging results that were available during my care of the patient were reviewed by me and considered in my medical decision making (see chart for details).    MDM Rules/Calculators/A&P                          70 year old female comes in with chief complaint of abnormal labs.  PCP sent her here because of low hemoglobin.  She has history of CKD for which she is requiring people.  She has a history of iron deficiency anemia and has required blood transfusion in the past.  She is having some generalized weakness and has dark stools.  She is Hemoccult positive.  Case discussed with medicine.  Patient will need 1 unit of PRBC and  GI has been consulted.  Final Clinical Impression(s) / ED Diagnoses Final diagnoses:  Symptomatic anemia  Anemia, unspecified type    Rx / DC Orders ED Discharge Orders         Ordered    pantoprazole (PROTONIX) 40 MG tablet        01/31/21 1251    Discharge instructions       Comments: You were seen in the hospital for suspected GI bleeding. You received a blood transfusion and underwent an endoscopy with GI.  Upon discharge: - Increase protonix to 40 mg twice daily for two months then back to once daily - Hold your Plavix for the next two days, restart on Sunday - Get lab work next week - Call the GI office if you notice any rectal bleeding - Follow up with GI in the office as needed  Continue your remaining medications. If you have any significant change or worsening of your symptoms do not hesitate to return to the ED.   01/31/21 1251    Increase activity slowly        01/26/21 1224    Diet - low sodium heart healthy        01/26/21 1224    Discharge instructions  Status:  Canceled       Comments: You were seen in the hospital for suspected GI bleeding. You received a blood transfusion and underwent an endoscopy with GI.  Upon discharge: - Increase protonix to 40 mg twice daily for two months - Continue taking Doxycycline twice daily as prescribed to completion - Hold your Plavix for the next two days, restart on Sunday - Get lab work next week - Call the GI office if you notice any rectal bleeding - Follow up with GI in the office as needed  Continue your remaining medications. If you have any significant change or worsening of your symptoms do not hesitate to return to the ED.   01/26/21 1224    CBC        01/26/21 1224    pantoprazole (PROTONIX) 40 MG tablet  2 times daily before meals,   Status:  Discontinued        01/26/21 1224    doxycycline (VIBRA-TABS) 100 MG tablet  Every 12 hours,   Status:  Discontinued        04 /21/22 [place],  [place], MD 02/09/21 1603

## 2021-02-15 ENCOUNTER — Inpatient Hospital Stay (HOSPITAL_COMMUNITY)
Admission: EM | Admit: 2021-02-15 | Discharge: 2021-02-20 | DRG: 391 | Disposition: A | Discharge status: Skilled Nursing Facility | Payer: Medicare Other | Attending: Internal Medicine | Admitting: Internal Medicine

## 2021-02-15 ENCOUNTER — Emergency Department (HOSPITAL_COMMUNITY): Payer: Medicare Other

## 2021-02-15 ENCOUNTER — Encounter (HOSPITAL_COMMUNITY): Payer: Self-pay

## 2021-02-15 ENCOUNTER — Other Ambulatory Visit: Payer: Self-pay

## 2021-02-15 DIAGNOSIS — Z6841 Body Mass Index (BMI) 40.0 and over, adult: Secondary | ICD-10-CM

## 2021-02-15 DIAGNOSIS — D72828 Other elevated white blood cell count: Secondary | ICD-10-CM | POA: Diagnosis present

## 2021-02-15 DIAGNOSIS — M109 Gout, unspecified: Secondary | ICD-10-CM | POA: Diagnosis present

## 2021-02-15 DIAGNOSIS — U071 COVID-19: Secondary | ICD-10-CM | POA: Diagnosis not present

## 2021-02-15 DIAGNOSIS — I10 Essential (primary) hypertension: Secondary | ICD-10-CM | POA: Diagnosis present

## 2021-02-15 DIAGNOSIS — R112 Nausea with vomiting, unspecified: Secondary | ICD-10-CM | POA: Diagnosis not present

## 2021-02-15 DIAGNOSIS — Z794 Long term (current) use of insulin: Secondary | ICD-10-CM

## 2021-02-15 DIAGNOSIS — I509 Heart failure, unspecified: Secondary | ICD-10-CM

## 2021-02-15 DIAGNOSIS — Z79890 Hormone replacement therapy (postmenopausal): Secondary | ICD-10-CM

## 2021-02-15 DIAGNOSIS — D6489 Other specified anemias: Secondary | ICD-10-CM | POA: Diagnosis present

## 2021-02-15 DIAGNOSIS — Z7902 Long term (current) use of antithrombotics/antiplatelets: Secondary | ICD-10-CM

## 2021-02-15 DIAGNOSIS — E6609 Other obesity due to excess calories: Secondary | ICD-10-CM | POA: Diagnosis present

## 2021-02-15 DIAGNOSIS — Z90710 Acquired absence of both cervix and uterus: Secondary | ICD-10-CM

## 2021-02-15 DIAGNOSIS — Z9049 Acquired absence of other specified parts of digestive tract: Secondary | ICD-10-CM

## 2021-02-15 DIAGNOSIS — E119 Type 2 diabetes mellitus without complications: Secondary | ICD-10-CM

## 2021-02-15 DIAGNOSIS — E890 Postprocedural hypothyroidism: Secondary | ICD-10-CM | POA: Diagnosis present

## 2021-02-15 DIAGNOSIS — E1151 Type 2 diabetes mellitus with diabetic peripheral angiopathy without gangrene: Secondary | ICD-10-CM | POA: Diagnosis present

## 2021-02-15 DIAGNOSIS — K2960 Other gastritis without bleeding: Secondary | ICD-10-CM | POA: Diagnosis not present

## 2021-02-15 DIAGNOSIS — Z8673 Personal history of transient ischemic attack (TIA), and cerebral infarction without residual deficits: Secondary | ICD-10-CM

## 2021-02-15 DIAGNOSIS — Z6836 Body mass index (BMI) 36.0-36.9, adult: Secondary | ICD-10-CM | POA: Diagnosis present

## 2021-02-15 DIAGNOSIS — F17210 Nicotine dependence, cigarettes, uncomplicated: Secondary | ICD-10-CM | POA: Diagnosis present

## 2021-02-15 DIAGNOSIS — E1122 Type 2 diabetes mellitus with diabetic chronic kidney disease: Secondary | ICD-10-CM | POA: Diagnosis present

## 2021-02-15 DIAGNOSIS — Z833 Family history of diabetes mellitus: Secondary | ICD-10-CM

## 2021-02-15 DIAGNOSIS — I5043 Acute on chronic combined systolic (congestive) and diastolic (congestive) heart failure: Secondary | ICD-10-CM

## 2021-02-15 DIAGNOSIS — G894 Chronic pain syndrome: Secondary | ICD-10-CM | POA: Diagnosis present

## 2021-02-15 DIAGNOSIS — N184 Chronic kidney disease, stage 4 (severe): Secondary | ICD-10-CM | POA: Diagnosis present

## 2021-02-15 DIAGNOSIS — I5023 Acute on chronic systolic (congestive) heart failure: Secondary | ICD-10-CM | POA: Diagnosis present

## 2021-02-15 DIAGNOSIS — K219 Gastro-esophageal reflux disease without esophagitis: Secondary | ICD-10-CM | POA: Diagnosis present

## 2021-02-15 DIAGNOSIS — I132 Hypertensive heart and chronic kidney disease with heart failure and with stage 5 chronic kidney disease, or end stage renal disease: Secondary | ICD-10-CM | POA: Diagnosis present

## 2021-02-15 DIAGNOSIS — Z79899 Other long term (current) drug therapy: Secondary | ICD-10-CM

## 2021-02-15 DIAGNOSIS — N185 Chronic kidney disease, stage 5: Secondary | ICD-10-CM | POA: Diagnosis present

## 2021-02-15 DIAGNOSIS — Z9114 Patient's other noncompliance with medication regimen: Secondary | ICD-10-CM

## 2021-02-15 DIAGNOSIS — Z96641 Presence of right artificial hip joint: Secondary | ICD-10-CM | POA: Diagnosis present

## 2021-02-15 DIAGNOSIS — D649 Anemia, unspecified: Secondary | ICD-10-CM | POA: Diagnosis present

## 2021-02-15 DIAGNOSIS — E872 Acidosis: Secondary | ICD-10-CM | POA: Diagnosis present

## 2021-02-15 DIAGNOSIS — J449 Chronic obstructive pulmonary disease, unspecified: Secondary | ICD-10-CM | POA: Diagnosis present

## 2021-02-15 DIAGNOSIS — I739 Peripheral vascular disease, unspecified: Secondary | ICD-10-CM | POA: Diagnosis present

## 2021-02-15 DIAGNOSIS — I2510 Atherosclerotic heart disease of native coronary artery without angina pectoris: Secondary | ICD-10-CM | POA: Diagnosis present

## 2021-02-15 DIAGNOSIS — R531 Weakness: Secondary | ICD-10-CM

## 2021-02-15 DIAGNOSIS — E785 Hyperlipidemia, unspecified: Secondary | ICD-10-CM | POA: Diagnosis present

## 2021-02-15 DIAGNOSIS — Z7951 Long term (current) use of inhaled steroids: Secondary | ICD-10-CM

## 2021-02-15 HISTORY — DX: Nausea with vomiting, unspecified: R11.2

## 2021-02-15 LAB — CBC WITH DIFFERENTIAL/PLATELET
Abs Immature Granulocytes: 0.14 10*3/uL — ABNORMAL HIGH (ref 0.00–0.07)
Basophils Absolute: 0 10*3/uL (ref 0.0–0.1)
Basophils Relative: 0 %
Eosinophils Absolute: 0.1 10*3/uL (ref 0.0–0.5)
Eosinophils Relative: 1 %
HCT: 28.3 % — ABNORMAL LOW (ref 36.0–46.0)
Hemoglobin: 8.2 g/dL — ABNORMAL LOW (ref 12.0–15.0)
Immature Granulocytes: 1 %
Lymphocytes Relative: 5 %
Lymphs Abs: 0.9 10*3/uL (ref 0.7–4.0)
MCH: 25.2 pg — ABNORMAL LOW (ref 26.0–34.0)
MCHC: 29 g/dL — ABNORMAL LOW (ref 30.0–36.0)
MCV: 87.1 fL (ref 80.0–100.0)
Monocytes Absolute: 0.7 10*3/uL (ref 0.1–1.0)
Monocytes Relative: 4 %
Neutro Abs: 16.3 10*3/uL — ABNORMAL HIGH (ref 1.7–7.7)
Neutrophils Relative %: 89 %
Platelets: 294 10*3/uL (ref 150–400)
RBC: 3.25 MIL/uL — ABNORMAL LOW (ref 3.87–5.11)
RDW: 23.9 % — ABNORMAL HIGH (ref 11.5–15.5)
WBC: 18.1 10*3/uL — ABNORMAL HIGH (ref 4.0–10.5)
nRBC: 0.1 % (ref 0.0–0.2)

## 2021-02-15 LAB — COMPREHENSIVE METABOLIC PANEL
ALT: 26 U/L (ref 0–44)
AST: 26 U/L (ref 15–41)
Albumin: 3.3 g/dL — ABNORMAL LOW (ref 3.5–5.0)
Alkaline Phosphatase: 66 U/L (ref 38–126)
Anion gap: 16 — ABNORMAL HIGH (ref 5–15)
BUN: 144 mg/dL — ABNORMAL HIGH (ref 8–23)
CO2: 28 mmol/L (ref 22–32)
Calcium: 9.9 mg/dL (ref 8.9–10.3)
Chloride: 96 mmol/L — ABNORMAL LOW (ref 98–111)
Creatinine, Ser: 2.57 mg/dL — ABNORMAL HIGH (ref 0.44–1.00)
GFR, Estimated: 20 mL/min — ABNORMAL LOW (ref >60)
Glucose, Bld: 223 mg/dL — ABNORMAL HIGH (ref 70–99)
Potassium: 3.8 mmol/L (ref 3.5–5.1)
Sodium: 140 mmol/L (ref 135–145)
Total Bilirubin: 0.8 mg/dL (ref 0.3–1.2)
Total Protein: 6.4 g/dL — ABNORMAL LOW (ref 6.5–8.1)

## 2021-02-15 LAB — TYPE AND SCREEN
ABO/RH(D): O POS
Antibody Screen: NEGATIVE

## 2021-02-15 LAB — T4, FREE: Free T4: 0.55 ng/dL — ABNORMAL LOW (ref 0.61–1.12)

## 2021-02-15 LAB — RESP PANEL BY RT-PCR (FLU A&B, COVID) ARPGX2
Influenza A by PCR: NEGATIVE
Influenza B by PCR: NEGATIVE
SARS Coronavirus 2 by RT PCR: NEGATIVE

## 2021-02-15 LAB — BLOOD GAS, VENOUS
Acid-Base Excess: 6.4 mmol/L — ABNORMAL HIGH (ref 0.0–2.0)
Bicarbonate: 32.1 mmol/L — ABNORMAL HIGH (ref 20.0–28.0)
O2 Saturation: 75.5 %
Patient temperature: 98.6
pCO2, Ven: 57.1 mmHg (ref 44.0–60.0)
pH, Ven: 7.369 (ref 7.250–7.430)
pO2, Ven: 42.8 mmHg (ref 32.0–45.0)

## 2021-02-15 LAB — CBC
HCT: 26.5 % — ABNORMAL LOW (ref 36.0–46.0)
Hemoglobin: 7.8 g/dL — ABNORMAL LOW (ref 12.0–15.0)
MCH: 26.1 pg (ref 26.0–34.0)
MCHC: 29.4 g/dL — ABNORMAL LOW (ref 30.0–36.0)
MCV: 88.6 fL (ref 80.0–100.0)
Platelets: 273 10*3/uL (ref 150–400)
RBC: 2.99 MIL/uL — ABNORMAL LOW (ref 3.87–5.11)
RDW: 24.2 % — ABNORMAL HIGH (ref 11.5–15.5)
WBC: 15.1 10*3/uL — ABNORMAL HIGH (ref 4.0–10.5)
nRBC: 0 % (ref 0.0–0.2)

## 2021-02-15 LAB — URINALYSIS, ROUTINE W REFLEX MICROSCOPIC
Bacteria, UA: NONE SEEN
Bilirubin Urine: NEGATIVE
Glucose, UA: NEGATIVE mg/dL
Ketones, ur: NEGATIVE mg/dL
Leukocytes,Ua: NEGATIVE
Nitrite: NEGATIVE
Protein, ur: NEGATIVE mg/dL
Specific Gravity, Urine: 1.009 (ref 1.005–1.030)
pH: 6 (ref 5.0–8.0)

## 2021-02-15 LAB — LACTIC ACID, PLASMA
Lactic Acid, Venous: 2.3 mmol/L (ref 0.5–1.9)
Lactic Acid, Venous: 2.1 mmol/L (ref 0.5–1.9)

## 2021-02-15 LAB — TSH: TSH: 10.814 u[IU]/mL — ABNORMAL HIGH (ref 0.350–4.500)

## 2021-02-15 LAB — HEMOGLOBIN A1C
Hgb A1c MFr Bld: 6.8 % — ABNORMAL HIGH (ref 4.8–5.6)
Mean Plasma Glucose: 148.46 mg/dL

## 2021-02-15 LAB — PROCALCITONIN: Procalcitonin: 0.13 ng/mL

## 2021-02-15 LAB — LIPASE, BLOOD: Lipase: 59 U/L — ABNORMAL HIGH (ref 11–51)

## 2021-02-15 LAB — CREATININE, SERUM
Creatinine, Ser: 2.44 mg/dL — ABNORMAL HIGH (ref 0.44–1.00)
GFR, Estimated: 21 mL/min — ABNORMAL LOW (ref >60)

## 2021-02-15 LAB — GLUCOSE, CAPILLARY: Glucose-Capillary: 159 mg/dL — ABNORMAL HIGH (ref 70–99)

## 2021-02-15 MED ORDER — FUROSEMIDE 10 MG/ML IJ SOLN
40.0000 mg | Freq: Two times a day (BID) | INTRAMUSCULAR | Status: AC
  Administered 2021-02-15 – 2021-02-16 (×2): 40 mg via INTRAVENOUS
  Filled 2021-02-15 (×2): qty 4

## 2021-02-15 MED ORDER — ONDANSETRON HCL 4 MG/2ML IJ SOLN: 4.0000 mg | Freq: Four times a day (QID) | INTRAMUSCULAR | Status: DC | PRN

## 2021-02-15 MED ORDER — PANTOPRAZOLE SODIUM 40 MG IV SOLR
40.0000 mg | Freq: Two times a day (BID) | INTRAVENOUS | Status: DC
  Administered 2021-02-15 – 2021-02-16 (×2): 40 mg via INTRAVENOUS
  Filled 2021-02-15 (×2): qty 40

## 2021-02-15 MED ORDER — CARVEDILOL 12.5 MG PO TABS
12.5000 mg | ORAL_TABLET | Freq: Two times a day (BID) | ORAL | Status: DC
  Administered 2021-02-16 – 2021-02-20 (×10): 12.5 mg via ORAL
  Filled 2021-02-15 (×10): qty 1

## 2021-02-15 MED ORDER — SODIUM CHLORIDE 0.9 % IV SOLN
2.0000 g | Freq: Once | INTRAVENOUS | Status: AC
  Administered 2021-02-15: 2 g via INTRAVENOUS
  Filled 2021-02-15: qty 2

## 2021-02-15 MED ORDER — SENNOSIDES-DOCUSATE SODIUM 8.6-50 MG PO TABS
1.0000 | ORAL_TABLET | Freq: Every evening | ORAL | Status: DC | PRN
  Filled 2021-02-15: qty 1

## 2021-02-15 MED ORDER — HYDRALAZINE HCL 20 MG/ML IJ SOLN: 10.0000 mg | INTRAMUSCULAR | Status: DC | PRN

## 2021-02-15 MED ORDER — SODIUM CHLORIDE 0.9 % IV BOLUS
500.0000 mL | Freq: Once | INTRAVENOUS | Status: AC
  Administered 2021-02-15: 500 mL via INTRAVENOUS

## 2021-02-15 MED ORDER — METRONIDAZOLE 500 MG/100ML IV SOLN
500.0000 mg | Freq: Once | INTRAVENOUS | Status: AC
  Administered 2021-02-15: 500 mg via INTRAVENOUS
  Filled 2021-02-15: qty 100

## 2021-02-15 MED ORDER — ALBUTEROL SULFATE HFA 108 (90 BASE) MCG/ACT IN AERS
2.0000 | INHALATION_SPRAY | Freq: Once | RESPIRATORY_TRACT | Status: AC
  Administered 2021-02-15: 2 via RESPIRATORY_TRACT
  Filled 2021-02-15: qty 6.7

## 2021-02-15 MED ORDER — VANCOMYCIN HCL IN DEXTROSE 1-5 GM/200ML-% IV SOLN: 1000.0000 mg | Freq: Once | INTRAVENOUS | Status: DC

## 2021-02-15 MED ORDER — INSULIN ASPART 100 UNIT/ML IJ SOLN
2.0000 [IU] | Freq: Three times a day (TID) | INTRAMUSCULAR | Status: DC
  Filled 2021-02-15: qty 0.02

## 2021-02-15 MED ORDER — INSULIN ASPART PROT & ASPART (70-30 MIX) 100 UNIT/ML ~~LOC~~ SUSP
20.0000 [IU] | Freq: Two times a day (BID) | SUBCUTANEOUS | Status: DC
  Administered 2021-02-16 – 2021-02-20 (×10): 20 [IU] via SUBCUTANEOUS
  Filled 2021-02-15: qty 10

## 2021-02-15 MED ORDER — ACETAMINOPHEN 325 MG PO TABS
650.0000 mg | ORAL_TABLET | Freq: Four times a day (QID) | ORAL | Status: DC | PRN
  Administered 2021-02-16 – 2021-02-18 (×2): 650 mg via ORAL
  Filled 2021-02-15 (×2): qty 2

## 2021-02-15 MED ORDER — DM-GUAIFENESIN ER 30-600 MG PO TB12: 1.0000 | ORAL_TABLET | Freq: Two times a day (BID) | ORAL | Status: DC | PRN

## 2021-02-15 MED ORDER — ONDANSETRON HCL 4 MG/2ML IJ SOLN
4.0000 mg | Freq: Once | INTRAMUSCULAR | Status: AC
  Administered 2021-02-15: 4 mg via INTRAVENOUS
  Filled 2021-02-15: qty 2

## 2021-02-15 MED ORDER — MOMETASONE FURO-FORMOTEROL FUM 200-5 MCG/ACT IN AERO: 2.0000 | INHALATION_SPRAY | Freq: Two times a day (BID) | RESPIRATORY_TRACT | Status: DC

## 2021-02-15 MED ORDER — INSULIN ASPART 100 UNIT/ML IJ SOLN
0.0000 [IU] | Freq: Every day | INTRAMUSCULAR | Status: DC
  Administered 2021-02-20: 2 [IU] via SUBCUTANEOUS
  Filled 2021-02-15: qty 0.05

## 2021-02-15 MED ORDER — CLOPIDOGREL BISULFATE 75 MG PO TABS
75.0000 mg | ORAL_TABLET | Freq: Every day | ORAL | Status: DC
  Administered 2021-02-16 – 2021-02-20 (×5): 75 mg via ORAL
  Filled 2021-02-15 (×5): qty 1

## 2021-02-15 MED ORDER — ALBUTEROL SULFATE (2.5 MG/3ML) 0.083% IN NEBU
2.5000 mg | INHALATION_SOLUTION | Freq: Four times a day (QID) | RESPIRATORY_TRACT | Status: DC | PRN
  Administered 2021-02-17 – 2021-02-18 (×2): 2.5 mg via RESPIRATORY_TRACT
  Filled 2021-02-15 (×2): qty 3

## 2021-02-15 MED ORDER — LEVOTHYROXINE SODIUM 50 MCG PO TABS: 150.0000 ug | ORAL_TABLET | Freq: Every day | ORAL | Status: DC

## 2021-02-15 MED ORDER — HEPARIN SODIUM (PORCINE) 5000 UNIT/ML IJ SOLN
5000.0000 [IU] | Freq: Three times a day (TID) | INTRAMUSCULAR | Status: DC
  Administered 2021-02-15 – 2021-02-20 (×16): 5000 [IU] via SUBCUTANEOUS
  Filled 2021-02-15 (×16): qty 1

## 2021-02-15 MED ORDER — VANCOMYCIN HCL 1500 MG/300ML IV SOLN
1500.0000 mg | Freq: Once | INTRAVENOUS | Status: AC
  Administered 2021-02-15: 1500 mg via INTRAVENOUS
  Filled 2021-02-15: qty 300

## 2021-02-15 MED ORDER — ONDANSETRON HCL 4 MG PO TABS: 4.0000 mg | ORAL_TABLET | Freq: Four times a day (QID) | ORAL | Status: DC | PRN

## 2021-02-15 MED ORDER — SUCRALFATE 1 GM/10ML PO SUSP
1.0000 g | Freq: Three times a day (TID) | ORAL | Status: DC
  Filled 2021-02-15 (×3): qty 10

## 2021-02-15 MED ORDER — INSULIN ASPART 100 UNIT/ML IJ SOLN
0.0000 [IU] | Freq: Three times a day (TID) | INTRAMUSCULAR | Status: DC
  Administered 2021-02-16: 2 [IU] via SUBCUTANEOUS
  Administered 2021-02-16: 3 [IU] via SUBCUTANEOUS
  Administered 2021-02-16 – 2021-02-18 (×5): 2 [IU] via SUBCUTANEOUS
  Administered 2021-02-18 (×2): 1 [IU] via SUBCUTANEOUS
  Administered 2021-02-19: 2 [IU] via SUBCUTANEOUS
  Administered 2021-02-19: 1 [IU] via SUBCUTANEOUS
  Administered 2021-02-19 – 2021-02-20 (×2): 3 [IU] via SUBCUTANEOUS
  Administered 2021-02-20: 2 [IU] via SUBCUTANEOUS
  Filled 2021-02-15: qty 0.09

## 2021-02-15 NOTE — H&P (Signed)
History and Physical    [name] [name] UVO:[id number] DOB: 28-Feb-1951 DOA: 02/15/2021  PCP: [name], MD Patient coming from: Home  Chief Complaint: Nausea vomiting  HPI: [name] [name] is a 70 y.o. female with medical history significant of systolic CHF with EF 74%, GERD, erosive gastritis, COPD, hypothyroidism, essential hypertension, iron deficiency anemia, CKD stage V comes to the hospital with complains of nausea and vomiting.  Patient was recently admitted 4 weeks ago for blood loss anemia requiring endoscopy which showed erosive gastritis.  She was discharged on PPI twice daily with outpatient follow-up. Patient states she lives at home with her son and gets home health aide but over the last 48 hours she has felt generally weak with backache and intermittent nausea vomiting.  During this time she is also had increasing lower extremity swelling.  This morning she was supposed to follow-up with her PCP but felt very weak therefore she was not able to make it.  Her aide visited her and because of her nausea vomiting and generally weak appearing she was asked to come to the ER for further evaluation.  In the ER, routine lab work showed creatinine of 2.57 which is close to her baseline, anion gap 16, lipase 59.  CT of the abdomen and pelvis was overall unremarkable without any evidence of pancreatitis or cholecystitis.  She had slightly low temperature of 96 therefore cultures were done and started on empiric antibiotic- vancomycin, cefepime and Flagyl.  WBC was noted to be 18.1.  Lactic acidosis, 2.3.  TSH was 10.8.  Medical team was requested to admit the patient  Social history-smokes 3-4 cigarettes daily, denies any alcohol and tobacco use   Review of Systems: As per HPI otherwise 10 point review of systems negative.  Review of Systems Otherwise negative except as per HPI, including: General: Denies fever, chills, night sweats or unintended weight loss. Resp: Denies cough,  wheezing, shortness of breath. Cardiac: Denies chest pain, palpitations, orthopnea, paroxysmal nocturnal dyspnea. GI: Denies abdominal pain, diarrhea or constipation GU: Denies dysuria, frequency, hesitancy or incontinence MS: Denies muscle aches, joint pain or swelling Neuro: Denies headache, neurologic deficits (focal weakness, numbness, tingling), abnormal gait Psych: Denies anxiety, depression, SI/HI/AVH Skin: Denies new rashes or lesions ID: Denies sick contacts, exotic exposures, travel  Past Medical History:  Diagnosis Date  . Anemia   . Arthritis   . Asthma   . Bronchitis   . CHF (congestive heart failure) ([place])   . Chronic kidney disease   . Chronic kidney disease   . Chronic pain syndrome   . COPD (chronic obstructive pulmonary disease) ([place])   . Cramping of feet   . Diabetes mellitus   . Diabetes mellitus without complication ([place])   . Dyspnea   . Full dentures   . GERD (gastroesophageal reflux disease)   . Gout   . Headache   . History of hiatal hernia   . History of hip replacement, total   . History of transfusion   . Hx MRSA infection    abscess left groin  . Hx of cardiac cath 06/2014  . Hyperlipidemia   . Hypertension   . Hypothyroid   . 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[place])   . Morbidly obese ([place])   . Peripheral vascular disease ([place])   . Positive cardiac stress test 12/2013   anterior and lateral ischemia on Myoview  . Sleep apnea    UNABLE TO TOLERATE C PAP  . Stress incontinence   . Thyroid  disease   . TIA (transient ischemic attack)     X2 NO RESIDUAL PROBLEMS  . Wears glasses     Past Surgical History:  Procedure Laterality Date  . ABDOMINAL HYSTERECTOMY    . BACK SURGERY  2020  . BASCILIC VEIN TRANSPOSITION Left 04/05/2020   Procedure: BASILIC VEIN TRANSPOSITION FIRST STAGE;  Surgeon: Angelia Mould, MD;  Location: [place];  Service: Vascular;  Laterality: Left;  . COLON SURGERY  1995   DUE TO POLYP  . DILATION AND CURETTAGE OF  UTERUS    . ESOPHAGOGASTRODUODENOSCOPY N/A 01/25/2021   Procedure: ESOPHAGOGASTRODUODENOSCOPY (EGD);  Surgeon: Clarene Essex, MD;  Location: [facility];  Service: Endoscopy;  Laterality: N/A;  . FOREARM FRACTURE SURGERY     Left arm  . HERNIA REPAIR     w/ mesh  . INCISION AND DRAINAGE ABSCESS Left 02/04/2013   Procedure: INCISION AND DRAINAGE LEFT BUTTOCK ABSCESS; INCISION AND DRAINAGE LEFT BREAST ABSCESS;  Surgeon: Harl Bowie, MD;  Location: [place];  Service: General;  Laterality: Left;  . INCISION AND DRAINAGE ABSCESS N/A 02/12/2013   Procedure: INCISION AND DEBRIDEMENT BUTTOCK WOUND ;  Surgeon: [name]. Georgette Dover, MD;  Location: [place];  Service: General;  Laterality: N/A;  . INCISION AND DRAINAGE ABSCESS N/A 02/14/2013   Procedure: INCISION AND DRAINAGE/DRESSING CHANGE;  Surgeon: Harl Bowie, MD;  Location: [place];  Service: General;  Laterality: N/A;  . INCISION AND DRAINAGE ABSCESS N/A 03/21/2015   Procedure: INCISION AND DRAINAGE PUBIC ABSCESS;  Surgeon: Excell Seltzer, MD;  Location: [facility];  Service: General;  Laterality: N/A;  . INCISION AND DRAINAGE PERIRECTAL ABSCESS Left 02/10/2013   Procedure: IRRIGATION AND DEBRIDEMENT OF BUTTOCK/PERINEAL ABSCESS;  Surgeon: [name]. Georgette Dover, MD;  Location: [place];  Service: General;  Laterality: Left;  . INCISION AND DRAINAGE PERIRECTAL ABSCESS N/A 02/16/2013   Procedure: IRRIGATION AND DEBRIDEMENT PERINEAL ABSCESS;  Surgeon: Zenovia Jarred, MD;  Location: [place];  Service: General;  Laterality: N/A;  . IRRIGATION AND DEBRIDEMENT ABSCESS N/A 02/06/2013   Procedure: IRRIGATION AND DEBRIDEMENT BUTTOCK ABSCESS AND DRESSING CHANGE;  Surgeon: Harl Bowie, MD;  Location: [place];  Service: General;  Laterality: N/A;  . IRRIGATION AND DEBRIDEMENT ABSCESS Left 02/08/2013   Procedure: IRRIGATION AND DEBRIDEMENT ABSCESS/DRESSING CHANGE;  Surgeon: Gwenyth Ober, MD;  Location: [place];  Service: General;  Laterality: Left;  . JOINT REPLACEMENT  2010 /  2012   . LAPAROSCOPIC CHOLECYSTECTOMY    . LEFT HEART CATHETERIZATION WITH CORONARY ANGIOGRAM N/A 07/02/2014   Procedure: LEFT HEART CATHETERIZATION WITH CORONARY ANGIOGRAM;  Surgeon: Lorretta Harp, MD;  Location: [facility];  Service: Cardiovascular;  Laterality: N/A;  . LOWER EXTREMITY ANGIOGRAM Right 04/20/2016   Procedure: Lower Extremity Angiogram;  Surgeon: Elam Dutch, MD;  Location: [facility] [facility];  Service: Cardiovascular;  Laterality: Right;  . MULTIPLE TOOTH EXTRACTIONS    . PERIPHERAL VASCULAR CATHETERIZATION N/A 04/20/2016   Procedure: Abdominal Aortogram;  Surgeon: Elam Dutch, MD;  Location: [place] [facility];  Service: Cardiovascular;  Laterality: N/A;  . PERIPHERAL VASCULAR CATHETERIZATION Right 04/20/2016   Procedure: Peripheral Vascular Intervention;  Surgeon: Elam Dutch, MD;  Location: [place] [facility];  Service: Cardiovascular;  Laterality: Right;  popiteal  . RIGHT HEART CATH N/A 10/27/2018   Procedure: RIGHT HEART CATH;  Surgeon: Larey Dresser, MD;  Location: [place] [facility];  Service: Cardiovascular;  Laterality: N/A;  . RIGHT HEART CATH N/A 03/20/2019  Procedure: RIGHT HEART CATH;  Surgeon: Larey Dresser, MD;  Location: [place] [facility];  Service: Cardiovascular;  Laterality: N/A;  . THYROIDECTOMY    . TOTAL HIP REVISION Right 08/11/2014   Procedure: RIGHT ACETABULAR REVISION;  Surgeon: Gearlean Alf, MD;  Location: [facility];  Service: Orthopedics;  Laterality: Right;  Mar[facility] Kitchen VASCULAR SURGERY      SOCIAL HISTORY:  reports that she has been smoking cigarettes. She has a 10.00 pack-year smoking history. She has never used smokeless tobacco. She reports current alcohol use of about 1.0 standard drink of alcohol per week. She reports that she does not use drugs.  Allergies  Allergen Reactions  . Nebivolol Swelling    Chest pain (reaction to Bystolic)  . Ace Inhibitors Swelling and Other (See Comments)    Tongue swell  . Ace Inhibitors  Swelling  . Morphine And Related Itching  . Morphine And Related Itching    FAMILY HISTORY: Family History  Problem Relation Age of Onset  . Diabetes Brother   . Cardiomyopathy Mother   . Heart disease Mother   . Cancer Father      Prior to Admission medications   Medication Sig Start Date End Date Taking? Authorizing Provider  albuterol (PROVENTIL) (2.5 MG/3ML) 0.083% nebulizer solution Take 2.5 mg by nebulization every 6 (six) hours as needed for wheezing or shortness of breath.    [provider]  albuterol (VENTOLIN HFA) 108 (90 Base) MCG/ACT inhaler Inhale 2 puffs into the lungs 4 (four) times daily as needed for wheezing or shortness of breath.    [provider]  allopurinol (ZYLOPRIM) 100 MG tablet Take 100 mg by mouth daily. 09/14/20   [provider]  B Complex Vitamins (VITAMIN B COMPLEX PO) Take 1 capsule by mouth daily.    [provider]  budesonide-formoterol (SYMBICORT) 160-4.5 MCG/ACT inhaler Inhale 2 puffs into the lungs 2 (two) times daily as needed (for SOB).     [provider]  carboxymethylcellulose (REFRESH PLUS) 0.5 % SOLN Place 1 drop into both eyes daily as needed (dry eyes).    [provider]  carvedilol (COREG) 12.5 MG tablet Take 12.5 mg by mouth 2 (two) times daily. 01/01/21   [provider]  cholecalciferol (VITAMIN D3) 25 MCG (1000 UNIT) tablet Take 1,000 Units by mouth daily.    [provider]  cinacalcet (SENSIPAR) 30 MG tablet Take 30 mg by mouth daily. 12/08/20   [provider]  clopidogrel (PLAVIX) 75 MG tablet TAKE 1 TABLET BY MOUTH EVERY DAY Patient taking differently: Take 75 mg by mouth daily. 06/18/17   Waynetta Sandy, MD  diclofenac Sodium (VOLTAREN) 1 % GEL Apply 1 application topically 4 (four) times daily as needed (pain). 08/15/20   [provider]  diphenhydrAMINE (BENADRYL) 25 MG tablet Take 25 mg by mouth every 6 (six) hours as needed for  allergies.    [provider]  Dulaglutide (TRULICITY) 1.5 HA/1.9FX SOPN Inject 1.5 mg into the skin every Monday.    [provider]  fluticasone (FLONASE) 50 MCG/ACT nasal spray Place 2 sprays into the nose as needed for allergies.     [provider]  hydrOXYzine (ATARAX/VISTARIL) 25 MG tablet Take 25 mg by mouth 2 (two) times daily as needed for anxiety. 12/08/20   [provider]  insulin lispro protamine-lispro (HUMALOG 75/25 MIX) (75-25) 100 UNIT/ML SUSP injection Inject 30-40 Units into the skin See admin instructions. Inject 40 units into the skin  morning and 30 units in the evening    [provider]  levothyroxine (SYNTHROID) 150 MCG tablet Take 150 mcg by mouth daily. 12/10/20   [provider]  magnesium oxide (MAG-OX) 400 (241.3 Mg) MG tablet Take 1 tablet (400 mg total) by mouth 2 (two) times daily. 01/14/20   Lavina Hamman, MD  metoCLOPramide (REGLAN) 5 MG tablet Take 5 mg by mouth 3 (three) times daily. 01/20/21   [provider]  metolazone (ZAROXOLYN) 5 MG tablet Take 5 mg by mouth See admin instructions. Takes 1 tablet on Monday, Wednesday and Friday 12/06/20   [provider]  Multiple Vitamin (MULTIVITAMIN WITH MINERALS) TABS tablet Take 1 tablet by mouth daily.    [provider]  MYRBETRIQ 50 MG TB24 tablet Take 50 mg by mouth daily. 01/03/21   [provider]  oxyCODONE-acetaminophen (PERCOCET) 10-325 MG tablet Take 1 tablet by mouth every 6 (six) hours as needed for pain. Patient taking differently: Take 1 tablet by mouth every 6 (six) hours. 04/05/20   Dagoberto Ligas, PA-C  pantoprazole (PROTONIX) 40 MG tablet Take 1 tablet (40 mg total) by mouth 2 (two) times daily before a meal for 60 days, THEN 1 tablet (40 mg total) daily. 01/31/21 05/31/21  Harold Hedge, MD  polyvinyl alcohol (LIQUIFILM TEARS) 1.4 % ophthalmic solution Place 2 drops into both eyes daily as needed for dry eyes.    [provider]  Potassium Chloride ER 20 MEQ TBCR Take 1 tablet by mouth 3 (three) times a week. Tuesday, Thursdays and Saturdays 11/24/20   [provider]  torsemide (DEMADEX) 100 MG tablet Take 100 mg by mouth 2 (two) times daily. 12/05/20   [provider]  TRULICITY 1.5 XK/4.8JE SOPN Inject 1.5 mg into the skin once a week. Monday 11/11/20   [provider]    Physical Exam: Vitals:   02/15/21 1430 02/15/21 1557 02/15/21 1609 02/15/21 1706  BP: 135/65 (!) 168/67  (!) 127/51  Pulse:  94  75  Resp:  16  16  Temp:   (!) 96.6 F (35.9 C)   TempSrc:   Rectal   SpO2:  100%  100%  Weight:      Height:          Constitutional: NAD, calm, comfortable, dry mouth.  Morbid obesity. Eyes: PERRL, lids and conjunctivae normal ENMT: Mucous membranes are moist. Posterior pharynx clear of any exudate or lesions.Normal dentition.  Neck: normal, supple, no masses, no thyromegaly Respiratory: clear to auscultation bilaterally, no wheezing, no crackles. Normal respiratory effort. No accessory muscle use.  Cardiovascular: Regular rate and rhythm, no murmurs / rubs / gallops. No extremity edema. 2+ pedal pulses. No carotid bruits.  Abdomen: no tenderness, no masses palpated. No hepatosplenomegaly. Bowel sounds positive.  Musculoskeletal: no clubbing / cyanosis. No joint deformity upper and lower extremities. Good ROM, no contractures. Normal muscle tone.  Skin: no rashes, lesions, ulcers. No induration Neurologic: CN 2-12 grossly intact. Sensation intact, DTR normal. Strength 5/5 in all 4.  Psychiatric: Normal judgment and insight. Alert and oriented x 3. Normal mood.     Labs on Admission: I have personally reviewed following labs and imaging studies  CBC: Recent Labs  Lab 02/15/21 1247  WBC 18.1*  NEUTROABS 16.3*  HGB 8.2*  HCT 28.3*  MCV 87.1  PLT 563   Basic Metabolic Panel: Recent Labs  Lab 02/15/21 1247  NA 140  K 3.8  CL 96*  CO2 28  GLUCOSE 223*   BUN 144*  CREATININE 2.57*  CALCIUM 9.9   GFR: Estimated Creatinine Clearance: 21.8 mL/min (A) (by C-G formula based on SCr of 2.57 mg/dL (H)). Liver Function Tests: Recent Labs  Lab 02/15/21 1247  AST 26  ALT 26  ALKPHOS 66  BILITOT 0.8  PROT 6.4*  ALBUMIN 3.3*   Recent Labs  Lab 02/15/21 1247  LIPASE 59*   No results for input(s): AMMONIA in the last 168 hours. Coagulation Profile: No results for input(s): INR, PROTIME in the last 168 hours. Cardiac Enzymes: No results for input(s): CKTOTAL, CKMB, CKMBINDEX, TROPONINI in the last 168 hours. BNP (last 3 results) No results for input(s): PROBNP in the last 8760 hours. HbA1C: No results for input(s): HGBA1C in the last 72 hours. CBG: No results for input(s): GLUCAP in the last 168 hours. Lipid Profile: No results for input(s): CHOL, HDL, LDLCALC, TRIG, CHOLHDL, LDLDIRECT in the last 72 hours. Thyroid Function Tests: Recent Labs    02/15/21 1430  TSH 10.814*   Anemia Panel: No results for input(s): VITAMINB12, FOLATE, FERRITIN, TIBC, IRON, RETICCTPCT in the last 72 hours. Urine analysis:    Component Value Date/Time   COLORURINE YELLOW 02/15/2021 1602   APPEARANCEUR HAZY (A) 02/15/2021 1602   LABSPEC 1.009 02/15/2021 1602   PHURINE 6.0 02/15/2021 1602   GLUCOSEU NEGATIVE 02/15/2021 1602   HGBUR MODERATE (A) 02/15/2021 1602   BILIRUBINUR NEGATIVE 02/15/2021 1602   KETONESUR NEGATIVE 02/15/2021 1602   PROTEINUR NEGATIVE 02/15/2021 1602   UROBILINOGEN 0.2 08/04/2014 1409   NITRITE NEGATIVE 02/15/2021 1602   LEUKOCYTESUR NEGATIVE 02/15/2021 1602   Sepsis Labs: !!!!!!!!!!!!!!!!!!!!!!!!!!!!!!!!!!!!!!!!!!!! @LABRCNTIP (procalcitonin:4,lacticidven:4) ) Recent Results (from the past 240 hour(s))  Resp Panel by RT-PCR (Flu A&B, Covid) Nasopharyngeal Swab     Status: None   Collection Time: 02/15/21  1:19 PM   Specimen: Nasopharyngeal Swab; Nasopharyngeal(NP) swabs in vial transport medium  Result Value Ref Range  Status   SARS Coronavirus 2 by RT PCR NEGATIVE NEGATIVE Final    Comment: (NOTE) SARS-CoV-2 target nucleic acids are NOT DETECTED.  The SARS-CoV-2 RNA is generally detectable in upper respiratory specimens during the acute phase of infection. The lowest concentration of SARS-CoV-2 viral copies this assay can detect is 138 copies/mL. A negative result does not preclude SARS-Cov-2 infection and should not be used as the sole basis for treatment or other patient management decisions. A negative result may occur with  improper specimen collection/handling, submission of specimen other than nasopharyngeal swab, presence of viral mutation(s) within the areas targeted by this assay, and inadequate number of viral copies(<138 copies/mL). A negative result must be combined with clinical observations, patient history, and epidemiological information. The expected result is Negative.  Fact Sheet for Patients:  EntrepreneurPulse.com.au  Fact Sheet for Healthcare Providers:  IncredibleEmployment.be  This test is no t yet approved or cleared by the [place] FDA and  has been authorized for detection and/or diagnosis of SARS-CoV-2 by FDA under an Emergency Use Authorization (EUA). This EUA will remain  in effect (meaning this test can be used) for the duration of the COVID-19 declaration under Section 564(b)(1) of the Act, 21 U.S.C.section 360bbb-3(b)(1), unless the authorization is terminated  or revoked sooner.       Influenza A by PCR NEGATIVE NEGATIVE Final   Influenza B by PCR NEGATIVE NEGATIVE Final    Comment: (NOTE) The Xpert Xpress SARS-CoV-2/FLU/RSV plus assay is intended as an aid in the diagnosis of influenza from Nasopharyngeal swab specimens and should  not be used as a sole basis for treatment. Nasal washings and aspirates are unacceptable for Xpert Xpress SARS-CoV-2/FLU/RSV testing.  Fact Sheet for  Patients: EntrepreneurPulse.com.au  Fact Sheet for Healthcare Providers: IncredibleEmployment.be  This test is not yet approved or cleared by the [place] FDA and has been authorized for detection and/or diagnosis of SARS-CoV-2 by FDA under an Emergency Use Authorization (EUA). This EUA will remain in effect (meaning this test can be used) for the duration of the COVID-19 declaration under Section 564(b)(1) of the Act, 21 U.S.C. section 360bbb-3(b)(1), unless the authorization is terminated or revoked.  Performed at [facility], [place] [address] [place], [place] [address]      Radiological Exams on Admission: CT ABDOMEN PELVIS WO CONTRAST  Result Date: 02/15/2021 CLINICAL DATA:  Nausea/vomiting, generalized abdominal pain EXAM: CT ABDOMEN AND PELVIS WITHOUT CONTRAST TECHNIQUE: Multidetector CT imaging of the abdomen and pelvis was performed following the standard protocol without IV contrast. COMPARISON:  CT 01/06/2020, chest CT 08/10/2019 FINDINGS: Lower chest: There are 2 left lower lobe pulmonary nodules, 1 of which measures 12 mm and is stable since and November 2020, the other measures 8 mm and is new since recent CT on 01/06/2020 (series 6, image 1 see arrow). This is partially visualized on the first slice on the scan. Hepatobiliary: Unchanged mild hepatomegaly and mildly nodular contour. Prior cholecystectomy. Pancreas: Unremarkable. Spleen: Unremarkable. Adrenals/Urinary Tract: Unchanged bilateral low-density nodular adrenal thickening. Multiple bilateral renal cysts which are unchanged from recent prior exam. Nonspecific bilateral perinephric stranding. No hydronephrosis, nephrolithiasis, or urolithiasis. Stomach/Bowel: Prior left hemicolectomy. No evidence of bowel obstruction. Normal appendix. Scattered colonic diverticuli. Vascular/Lymphatic: Aorto bi-iliac atherosclerotic calcifications. No AAA. Reproductive: Prior  hysterectomy. Other: No abdominopelvic ascites. Musculoskeletal: No suspicious lytic or blastic lesions. Multilevel degenerative changes of the spine. Prior posterior and interbody fusion at L4-L5 partially visualized right total hip arthroplasty. IMPRESSION: No acute findings to explain the patient's pain. No evidence of bowel obstruction. Normal appendix. Two lef lower lobe pulmonary nodules, one of which measures 12 mm and stable since November 2020, the other measures 8 mm and is NEW since 01/06/2020. Recommend non-emergent noncontrast CT of the chest as an outpatient to further evaluate as this is partially visualized. Electronically Signed   By: Maurine Simmering   On: 02/15/2021 15:47   DG Chest Portable 1 View  Result Date: 02/15/2021 CLINICAL DATA:  Status post vomiting x2 today. EXAM: PORTABLE CHEST 1 VIEW COMPARISON:  Single-view of the chest 01/05/2021 and 01/06/2020. FINDINGS: There is cardiomegaly without edema. Lungs clear. No pneumothorax or pleural fluid. No acute or focal bony abnormality. IMPRESSION: No acute disease. Cardiomegaly. Electronically Signed   By: Inge Rise M.D.   On: 02/15/2021 14:06     All images have been reviewed by me personally.  EKG: Independently reviewed.  Nonischemic  Assessment/Plan Principal Problem:   Intractable nausea and vomiting Active Problems:   PAD (peripheral artery disease) (H[facility])   DM (diabetes mellitus) ([place])   CKD (chronic kidney disease), stage IV (H[facility])   Morbid obesity ([facility])   HTN (hypertension)   Chronic CHF (congestive heart failure) ([facility])   Anemia, unspecified    Nausea and vomiting, intractable - Unclear etiology.  Lipase negative, has history of cholecystectomy.  CT abdomen pelvis negative.  This could be exacerbation of her erosive gastritis versus bowel edema from fluid overload.   - Antiemetics as needed.  Supportive care. - We will give Lasix 40 mg IV twice daily for 2  doses  Erosive gastritis - Had endoscopy about 4  weeks ago showing erosive gastritis.  We will place her on PPI twice daily, Carafate with meals.  Monitor her symptoms.  Follows outpatient [facility] - Clear liquid diet, advance as tolerated  Acute congestive heart failure with reduced ejection fraction, 40%.  Class III - Patient has some signs of volume overload with bilateral lower extremity pitting edema.  Could also have bowel edema causing her nausea and vomiting. - Repeat echocardiogram. - Lasix 40 mg IV twice daily.  Monitor renal function and urine output. - Replete and check electrolytes as needed  Lactic acidosis-improving.  Unable to give more fluid due to signs of volume overload  SIRS - No obvious evidence of infection.  Chest x-ray is negative, UA is negative.  Check procalcitonin.  Blood cultures have been sent.  ER gave a dose of empiric cefepime, vancomycin and Flagyl.  Does have elevated WBC but I suspect this may be reactive therefore will monitor off antibiotics  CKD stage V - Baseline creatinine 3.0.  Admission creatinine 2.5 therefore closely monitor.  History of COPD with tobacco use - As needed bronchodilators  Hypothyroidism - Elevated TSH.  Check T4 - Continue Synthroid  History of CAD - Currently chest pain-free.  Continue Plavix and Coreg  Diabetes mellitus type 2, insulin-dependent - Currently Trulicity on hold.  We will reduce her 7030 to 20 units twice daily - Accu-Cheks and sliding scale     DVT prophylaxis: Hep SQ Code Status: Full Family Communication: None at bedside Consults called: None Admission status: Observation admission  Status is: Observation  The patient remains OBS appropriate and will d/c before 2 midnights.  Dispo: The patient is from: Home              Anticipated d/c is to: Home              Patient currently is not medically stable to d/c.   Difficult to place patient No       Time Spent: 65 minutes.  >50% of the time was devoted to discussing the patients care,  assessment, plan and disposition with other care givers along with counseling the patient about the risks and benefits of treatment.    Fumie Fiallo Arsenio Loader MD [facility]  If 7PM-7AM, please contact night-coverage   02/15/2021, 5:49 PM

## 2021-02-15 NOTE — ED Notes (Signed)
Patient transported to CT 

## 2021-02-15 NOTE — ED Notes (Signed)
RN notified of abnormal lab 

## 2021-02-15 NOTE — ED Triage Notes (Signed)
Pt comes from home w c/o n/v, states she had 2 episodes of vomit earlier today, denies blood in emesis. Pt c/o body aches and weakness, was dc'd from rehab facility last week

## 2021-02-15 NOTE — ED Notes (Signed)
Brought pt tea

## 2021-02-15 NOTE — Progress Notes (Signed)
A consult was received from an ED physician for Cefepime and Vancomycin per pharmacy dosing.  The patient's profile has been reviewed for ht/wt/allergies/indication/available labs.   A one time order has been placed for Cefepime 2g and Vanc 1500mg .  Further antibiotics/pharmacy consults should be ordered by admitting physician if indicated.                       Thank you, Gretta Arab PharmD, BCPS Clinical Pharmacist [facility] main pharmacy [phone] 02/15/2021 4:16 PM

## 2021-02-15 NOTE — ED Provider Notes (Signed)
[facility] DEPT Provider Note   CSN: [id number] Arrival date & time: 02/15/21  1139     History Chief Complaint  Patient presents with  . Emesis    [name] [name] is a 70 y.o. female.  HPI   Pt is a 70 y/o female with a h/o anemia, COPD, CHF, diabetes, GERD, hyperlipidemia, hypertension, hypothyroidism, obesity, sleep apnea, peripheral vascular disease,  who presents to the ED today for eval of nausea and vomiting that started earlier today. She has had 2 episodes of vomiting. Denies hematemesis. Denies abd pain, diarrhea, constipation, fevers, dysuria.  Denies chest pain, shortness of breath but does report some wheezing.  She also has some chills.  Past Medical History:  Diagnosis Date  . Anemia   . Arthritis   . Asthma   . Bronchitis   . CHF (congestive heart failure) ([place])   . Chronic kidney disease   . Chronic kidney disease   . Chronic pain syndrome   . COPD (chronic obstructive pulmonary disease) ([place])   . Cramping of feet   . Diabetes mellitus   . Diabetes mellitus without complication ([place])   . Dyspnea   . Full dentures   . GERD (gastroesophageal reflux disease)   . Gout   . Headache   . History of hiatal hernia   . History of hip replacement, total   . History of transfusion   . Hx MRSA infection    abscess left groin  . Hx of cardiac cath 06/2014  . Hyperlipidemia   . Hypertension   . Hypothyroid   . 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 Morbidly obese ([place])   . Peripheral vascular disease ([place])   . Positive cardiac stress test 12/2013   anterior and lateral ischemia on Myoview  . Sleep apnea    UNABLE TO TOLERATE C PAP  . Stress incontinence   . Thyroid disease   . TIA (transient ischemic attack)     X2 NO RESIDUAL PROBLEMS  . Wears glasses     Patient Active Problem List   Diagnosis Date Noted  . Intractable nausea and vomiting 02/15/2021  . Symptomatic anemia 01/24/2021  . Anemia, unspecified 01/08/2020   . Bacteremia 01/07/2020  . UTI (urinary tract infection) 01/06/2020  . Anemia 10/26/2019  . S/P lumbar fusion 09/18/2019  . Chronic CHF (congestive heart failure) ([place]) 03/16/2019  . Leukocytosis 03/16/2019  . Chest pain 11/27/2018  . Pre-syncope 11/27/2018  . Epigastric abdominal pain 11/27/2018  . Solitary pulmonary nodule 11/06/2018  . Hypertensive heart and renal disease, stage 1-4 or unspecified chronic kidney disease, with heart failure ([place]) 10/20/2018  . CHF (congestive heart failure), NYHA class III, acute on chronic, systolic ([place]) 60/07/9322  . 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, unspecified extremity ([place]) 04/19/2016  . Cellulitis of abdominal wall 03/21/2015  . Acute on chronic renal failure ([place]) 03/21/2015  . DM (diabetes mellitus), type 2 with renal complications ([place]) 55/73/2202  . Abdominal wall abscess 03/21/2015  . Failed total hip arthroplasty ([place]) 08/11/2014  . Low urine output 08/11/2014  . Acute on chronic systolic CHF (congestive heart failure), NYHA class 3 ([place]) 08/11/2014  . Asthma, chronic 08/11/2014  . Hypothyroidism 08/11/2014  . OSA (obstructive sleep apnea) 08/11/2014  . Positive cardiac stress test 06/29/2014  . Hyperlipidemia 02/12/2014  . Left arm weakness 12/23/2013  . TIA (transient ischemic attack) 12/23/2013  . Left buttock abscess 10/12/2013  . Cellulitis and abscess of leg,  right 04/16/2013  . Acute blood loss anemia 02/14/2013  . HTN (hypertension) 02/14/2013  . Cellulitis and abscess of buttock 02/09/2013  . Morbid obesity ([place]) 02/03/2013  . Diarrhea 02/03/2013  . Hyponatremia 02/03/2013  . DM (diabetes mellitus) ([place]) 02/02/2013  . CKD (chronic kidney disease), stage IV ([place]) 02/02/2013  . Cellulitis 02/02/2013  . PAD (peripheral artery disease) ([place]) 04/26/2011  . Tobacco abuse 04/26/2011    Past Surgical History:  Procedure Laterality Date  . ABDOMINAL HYSTERECTOMY    . BACK SURGERY  2020  .  BASCILIC VEIN TRANSPOSITION Left 04/05/2020   Procedure: BASILIC VEIN TRANSPOSITION FIRST STAGE;  Surgeon: Angelia Mould, MD;  Location: [facility];  Service: Vascular;  Laterality: Left;  . COLON SURGERY  1995   DUE TO POLYP  . DILATION AND CURETTAGE OF UTERUS    . ESOPHAGOGASTRODUODENOSCOPY N/A 01/25/2021   Procedure: ESOPHAGOGASTRODUODENOSCOPY (EGD);  Surgeon: Clarene Essex, MD;  Location: [facility];  Service: Endoscopy;  Laterality: N/A;  . FOREARM FRACTURE SURGERY     Left arm  . HERNIA REPAIR     w/ mesh  . INCISION AND DRAINAGE ABSCESS Left 02/04/2013   Procedure: INCISION AND DRAINAGE LEFT BUTTOCK ABSCESS; INCISION AND DRAINAGE LEFT BREAST ABSCESS;  Surgeon: Harl Bowie, MD;  Location: [facility];  Service: General;  Laterality: Left;  . INCISION AND DRAINAGE ABSCESS N/A 02/12/2013   Procedure: INCISION AND DEBRIDEMENT BUTTOCK WOUND ;  Surgeon: [name]. Georgette Dover, MD;  Location: [facility];  Service: General;  Laterality: N/A;  . INCISION AND DRAINAGE ABSCESS N/A 02/14/2013   Procedure: INCISION AND DRAINAGE/DRESSING CHANGE;  Surgeon: Harl Bowie, MD;  Location: [facility];  Service: General;  Laterality: N/A;  . INCISION AND DRAINAGE ABSCESS N/A 03/21/2015   Procedure: INCISION AND DRAINAGE PUBIC ABSCESS;  Surgeon: Excell Seltzer, MD;  Location: [facility];  Service: General;  Laterality: N/A;  . INCISION AND DRAINAGE PERIRECTAL ABSCESS Left 02/10/2013   Procedure: IRRIGATION AND DEBRIDEMENT OF BUTTOCK/PERINEAL ABSCESS;  Surgeon: [name]. Georgette Dover, MD;  Location: [facility];  Service: General;  Laterality: Left;  . INCISION AND DRAINAGE PERIRECTAL ABSCESS N/A 02/16/2013   Procedure: IRRIGATION AND DEBRIDEMENT PERINEAL ABSCESS;  Surgeon: Zenovia Jarred, MD;  Location: [facility];  Service: General;  Laterality: N/A;  . IRRIGATION AND DEBRIDEMENT ABSCESS N/A 02/06/2013   Procedure: IRRIGATION AND DEBRIDEMENT BUTTOCK ABSCESS AND DRESSING CHANGE;  Surgeon: Harl Bowie, MD;  Location: [facility];  Service:  General;  Laterality: N/A;  . IRRIGATION AND DEBRIDEMENT ABSCESS Left 02/08/2013   Procedure: IRRIGATION AND DEBRIDEMENT ABSCESS/DRESSING CHANGE;  Surgeon: Gwenyth Ober, MD;  Location: [facility];  Service: General;  Laterality: Left;  . JOINT REPLACEMENT  2010 / 2012   . LAPAROSCOPIC CHOLECYSTECTOMY    . LEFT HEART CATHETERIZATION WITH CORONARY ANGIOGRAM N/A 07/02/2014   Procedure: LEFT HEART CATHETERIZATION WITH CORONARY ANGIOGRAM;  Surgeon: Lorretta Harp, MD;  Location: [facility] CATH LAB;  Service: Cardiovascular;  Laterality: N/A;  . LOWER EXTREMITY ANGIOGRAM Right 04/20/2016   Procedure: Lower Extremity Angiogram;  Surgeon: Elam Dutch, MD;  Location: [facility] CV LAB;  Service: Cardiovascular;  Laterality: Right;  . MULTIPLE TOOTH EXTRACTIONS    . PERIPHERAL VASCULAR CATHETERIZATION N/A 04/20/2016   Procedure: Abdominal Aortogram;  Surgeon: Elam Dutch, MD;  Location: [facility] CV LAB;  Service: Cardiovascular;  Laterality: N/A;  . PERIPHERAL VASCULAR CATHETERIZATION Right 04/20/2016   Procedure: Peripheral Vascular Intervention;  Surgeon: Elam Dutch, MD;  Location: [facility]  INVASIVE CV LAB;  Service: Cardiovascular;  Laterality: Right;  popiteal  . RIGHT HEART CATH N/A 10/27/2018   Procedure: RIGHT HEART CATH;  Surgeon: Larey Dresser, MD;  Location: [facility] CV LAB;  Service: Cardiovascular;  Laterality: N/A;  . RIGHT HEART CATH N/A 03/20/2019   Procedure: RIGHT HEART CATH;  Surgeon: Larey Dresser, MD;  Location: [facility] CV LAB;  Service: Cardiovascular;  Laterality: N/A;  . THYROIDECTOMY    . TOTAL HIP REVISION Right 08/11/2014   Procedure: RIGHT ACETABULAR REVISION;  Surgeon: Gearlean Alf, MD;  Location: [facility];  Service: Orthopedics;  Laterality: Right;  [facility] VASCULAR SURGERY       OB History   No obstetric history on file.     Family History  Problem Relation Age of Onset  . Diabetes Brother   . Cardiomyopathy Mother   . Heart disease Mother   . Cancer Father      Social History   Tobacco Use  . Smoking status: Current Some Day Smoker    Packs/day: 0.25    Years: 40.00    Pack years: 10.00    Types: Cigarettes  . Smokeless tobacco: Never Used  Vaping Use  . Vaping Use: Never used  Substance Use Topics  . Alcohol use: Yes    Alcohol/week: 1.0 standard drink    Types: 1 Standard drinks or equivalent per week    Comment: OCC  . Drug use: No    Home Medications Prior to Admission medications   Medication Sig Start Date End Date Taking? Authorizing Provider  albuterol (PROVENTIL) (2.5 MG/3ML) 0.083% nebulizer solution Take 2.5 mg by nebulization every 6 (six) hours as needed for wheezing or shortness of breath.    [provider]  albuterol (VENTOLIN HFA) 108 (90 Base) MCG/ACT inhaler Inhale 2 puffs into the lungs 4 (four) times daily as needed for wheezing or shortness of breath.    [provider]  allopurinol (ZYLOPRIM) 100 MG tablet Take 100 mg by mouth daily. 09/14/20   [provider]  B Complex Vitamins (VITAMIN B COMPLEX PO) Take 1 capsule by mouth daily.    [provider]  budesonide-formoterol (SYMBICORT) 160-4.5 MCG/ACT inhaler Inhale 2 puffs into the lungs 2 (two) times daily as needed (for SOB).     [provider]  carboxymethylcellulose (REFRESH PLUS) 0.5 % SOLN Place 1 drop into both eyes daily as needed (dry eyes).    [provider]  carvedilol (COREG) 12.5 MG tablet Take 12.5 mg by mouth 2 (two) times daily. 01/01/21   [provider]  cholecalciferol (VITAMIN D3) 25 MCG (1000 UNIT) tablet Take 1,000 Units by mouth daily.    [provider]  cinacalcet (SENSIPAR) 30 MG tablet Take 30 mg by mouth daily. 12/08/20   [provider]  clopidogrel (PLAVIX) 75 MG tablet TAKE 1 TABLET BY MOUTH EVERY DAY Patient taking differently: Take 75 mg by mouth daily. 06/18/17   Waynetta Sandy, MD  diclofenac Sodium (VOLTAREN) 1 % GEL Apply 1 application  topically 4 (four) times daily as needed (pain). 08/15/20   [provider]  diphenhydrAMINE (BENADRYL) 25 MG tablet Take 25 mg by mouth every 6 (six) hours as needed for allergies.    [provider]  Dulaglutide (TRULICITY) 1.5 LG/9.2JJ SOPN Inject 1.5 mg into the skin every Monday.    [provider]  fluticasone (FLONASE) 50 MCG/ACT nasal spray Place 2 sprays into the nose as needed for allergies.  [provider]  hydrOXYzine (ATARAX/VISTARIL) 25 MG tablet Take 25 mg by mouth 2 (two) times daily as needed for anxiety. 12/08/20   [provider]  insulin lispro protamine-lispro (HUMALOG 75/25 MIX) (75-25) 100 UNIT/ML SUSP injection Inject 30-40 Units into the skin See admin instructions. Inject 40 units into the skin morning and 30 units in the evening    [provider]  levothyroxine (SYNTHROID) 150 MCG tablet Take 150 mcg by mouth daily. 12/10/20   [provider]  magnesium oxide (MAG-OX) 400 (241.3 Mg) MG tablet Take 1 tablet (400 mg total) by mouth 2 (two) times daily. 01/14/20   Lavina Hamman, MD  metoCLOPramide (REGLAN) 5 MG tablet Take 5 mg by mouth 3 (three) times daily. 01/20/21   [provider]  metolazone (ZAROXOLYN) 5 MG tablet Take 5 mg by mouth See admin instructions. Takes 1 tablet on Monday, Wednesday and Friday 12/06/20   [provider]  Multiple Vitamin (MULTIVITAMIN WITH MINERALS) TABS tablet Take 1 tablet by mouth daily.    [provider]  MYRBETRIQ 50 MG TB24 tablet Take 50 mg by mouth daily. 01/03/21   [provider]  oxyCODONE-acetaminophen (PERCOCET) 10-325 MG tablet Take 1 tablet by mouth every 6 (six) hours as needed for pain. Patient taking differently: Take 1 tablet by mouth every 6 (six) hours. 04/05/20   Dagoberto Ligas, PA-C  pantoprazole (PROTONIX) 40 MG tablet Take 1 tablet (40 mg total) by mouth 2 (two) times daily before a meal for 60 days, THEN 1 tablet (40 mg  total) daily. 01/31/21 05/31/21  Harold Hedge, MD  polyvinyl alcohol (LIQUIFILM TEARS) 1.4 % ophthalmic solution Place 2 drops into both eyes daily as needed for dry eyes.    [provider]  Potassium Chloride ER 20 MEQ TBCR Take 1 tablet by mouth 3 (three) times a week. Tuesday, Thursdays and Saturdays 11/24/20   [provider]  torsemide (DEMADEX) 100 MG tablet Take 100 mg by mouth 2 (two) times daily. 12/05/20   [provider]  TRULICITY 1.5 EX/9.3ZJ SOPN Inject 1.5 mg into the skin once a week. Monday 11/11/20   [provider]    Allergies    Nebivolol, Ace inhibitors, Ace inhibitors, Morphine and related, and Morphine and related  Review of Systems   Review of Systems  Constitutional: Positive for chills. Negative for fever.  HENT: Negative for ear pain and sore throat.   Eyes: Negative for pain and visual disturbance.  Respiratory: Positive for wheezing. Negative for cough and shortness of breath.   Cardiovascular: Negative for chest pain.  Gastrointestinal: Positive for nausea and vomiting. Negative for abdominal pain.  Genitourinary: Negative for dysuria and hematuria.  Musculoskeletal: Positive for back pain (chronic) and neck pain (chronic).  Skin: Negative for rash.  Neurological: Negative for headaches.  All other systems reviewed and are negative.   Physical Exam Updated Vital Signs BP (!) 127/51   Pulse 75   Temp (!) 96.6 F (35.9 C) (Rectal)   Resp 16   Ht 5\' 3"  (1.6 m)   Wt 90.7 kg   SpO2 100%   BMI 35.43 kg/m   Physical Exam Vitals and nursing note reviewed.  Constitutional:      General: She is not in acute distress.    Appearance: She is well-developed.  HENT:     Head: Normocephalic and atraumatic.  Eyes:     Conjunctiva/sclera: Conjunctivae normal.  Cardiovascular:     Rate and Rhythm: Normal  rate and regular rhythm.     Heart sounds: Normal heart sounds. No murmur heard.   Pulmonary:     Effort: Pulmonary  effort is normal. No respiratory distress.     Breath sounds: Wheezing (end expiratory wheezing anteriorly) present. No rhonchi or rales.  Abdominal:     General: Bowel sounds are normal.     Palpations: Abdomen is soft.     Tenderness: There is abdominal tenderness (LUQ, LLQ). There is no guarding or rebound.  Musculoskeletal:     Cervical back: Neck supple.     Right lower leg: Edema present.     Left lower leg: Edema present.  Skin:    General: Skin is warm and dry.  Neurological:     Mental Status: She is alert.     ED Results / Procedures / Treatments   Labs (all labs ordered are listed, but only abnormal results are displayed) Labs Reviewed  COMPREHENSIVE METABOLIC PANEL - Abnormal; Notable for the following components:      Result Value   Chloride 96 (*)    Glucose, Bld 223 (*)    BUN 144 (*)    Creatinine, Ser 2.57 (*)    Total Protein 6.4 (*)    Albumin 3.3 (*)    GFR, Estimated 20 (*)    Anion gap 16 (*)    All other components within normal limits  LIPASE, BLOOD - Abnormal; Notable for the following components:   Lipase 59 (*)    All other components within normal limits  CBC WITH DIFFERENTIAL/PLATELET - Abnormal; Notable for the following components:   WBC 18.1 (*)    RBC 3.25 (*)    Hemoglobin 8.2 (*)    HCT 28.3 (*)    MCH 25.2 (*)    MCHC 29.0 (*)    RDW 23.9 (*)    Neutro Abs 16.3 (*)    Abs Immature Granulocytes 0.14 (*)    All other components within normal limits  URINALYSIS, ROUTINE W REFLEX MICROSCOPIC - Abnormal; Notable for the following components:   APPearance HAZY (*)    Hgb urine dipstick MODERATE (*)    All other components within normal limits  BLOOD GAS, VENOUS - Abnormal; Notable for the following components:   Bicarbonate 32.1 (*)    Acid-Base Excess 6.4 (*)    All other components within normal limits  LACTIC ACID, PLASMA - Abnormal; Notable for the following components:   Lactic Acid, Venous 2.3 (*)    All other components  within normal limits  LACTIC ACID, PLASMA - Abnormal; Notable for the following components:   Lactic Acid, Venous 2.1 (*)    All other components within normal limits  TSH - Abnormal; Notable for the following components:   TSH 10.814 (*)    All other components within normal limits  RESP PANEL BY RT-PCR (FLU A&B, COVID) ARPGX2  CULTURE, BLOOD (ROUTINE X 2)  CULTURE, BLOOD (ROUTINE X 2)  T3, FREE  T4, FREE  TYPE AND SCREEN    EKG EKG Interpretation  Date/Time:  Wednesday Feb 15 2021 13:53:02 EDT Ventricular Rate:  60 PR Interval:  170 QRS Duration: 136 QT Interval:  474 QTC Calculation: 474 R Axis:   -33 Text Interpretation: Sinus rhythm with sinus arrhythmia with occasional Premature ventricular complexes Left axis deviation Non-specific intra-ventricular conduction block Minimal voltage criteria for LVH, may be normal variant ( Cornell product ) Nonspecific T wave abnormality Abnormal ECG Confirmed by Carmin Muskrat [phone]) on 02/15/2021 4:07:05 PM  Radiology CT ABDOMEN PELVIS WO CONTRAST  Result Date: 02/15/2021 CLINICAL DATA:  Nausea/vomiting, generalized abdominal pain EXAM: CT ABDOMEN AND PELVIS WITHOUT CONTRAST TECHNIQUE: Multidetector CT imaging of the abdomen and pelvis was performed following the standard protocol without IV contrast. COMPARISON:  CT 01/06/2020, chest CT 08/10/2019 FINDINGS: Lower chest: There are 2 left lower lobe pulmonary nodules, 1 of which measures 12 mm and is stable since and November 2020, the other measures 8 mm and is new since recent CT on 01/06/2020 (series 6, image 1 see arrow). This is partially visualized on the first slice on the scan. Hepatobiliary: Unchanged mild hepatomegaly and mildly nodular contour. Prior cholecystectomy. Pancreas: Unremarkable. Spleen: Unremarkable. Adrenals/Urinary Tract: Unchanged bilateral low-density nodular adrenal thickening. Multiple bilateral renal cysts which are unchanged from recent prior exam. Nonspecific  bilateral perinephric stranding. No hydronephrosis, nephrolithiasis, or urolithiasis. Stomach/Bowel: Prior left hemicolectomy. No evidence of bowel obstruction. Normal appendix. Scattered colonic diverticuli. Vascular/Lymphatic: Aorto bi-iliac atherosclerotic calcifications. No AAA. Reproductive: Prior hysterectomy. Other: No abdominopelvic ascites. Musculoskeletal: No suspicious lytic or blastic lesions. Multilevel degenerative changes of the spine. Prior posterior and interbody fusion at L4-L5 partially visualized right total hip arthroplasty. IMPRESSION: No acute findings to explain the patient's pain. No evidence of bowel obstruction. Normal appendix. Two lef lower lobe pulmonary nodules, one of which measures 12 mm and stable since November 2020, the other measures 8 mm and is NEW since 01/06/2020. Recommend non-emergent noncontrast CT of the chest as an outpatient to further evaluate as this is partially visualized. Electronically Signed   By: Maurine Simmering   On: 02/15/2021 15:47   DG Chest Portable 1 View  Result Date: 02/15/2021 CLINICAL DATA:  Status post vomiting x2 today. EXAM: PORTABLE CHEST 1 VIEW COMPARISON:  Single-view of the chest 01/05/2021 and 01/06/2020. FINDINGS: There is cardiomegaly without edema. Lungs clear. No pneumothorax or pleural fluid. No acute or focal bony abnormality. IMPRESSION: No acute disease. Cardiomegaly. Electronically Signed   By: Inge Rise M.D.   On: 02/15/2021 14:06    Procedures Procedures   Medications Ordered in ED Medications  metroNIDAZOLE (FLAGYL) IVPB 500 mg (500 mg Intravenous New Bag/Given 02/15/21 1722)  vancomycin (VANCOREADY) IVPB 1500 mg/300 mL (has no administration in time range)  ondansetron (ZOFRAN) injection 4 mg (4 mg Intravenous Given 02/15/21 1320)  albuterol (VENTOLIN HFA) 108 (90 Base) MCG/ACT inhaler 2 puff (2 puffs Inhalation Given 02/15/21 1320)  sodium chloride 0.9 % bolus 500 mL (0 mLs Intravenous Stopped 02/15/21 1723)   ceFEPIme (MAXIPIME) 2 g in sodium chloride 0.9 % 100 mL IVPB (0 g Intravenous Stopped 02/15/21 1718)    ED Course  I have reviewed the triage vital signs and the nursing notes.  Pertinent labs & imaging results that were available during my care of the patient were reviewed by me and considered in my medical decision making (see chart for details).    MDM Rules/Calculators/A&P                          70 y/o F presenting for eval of nv.   Reviewed/interpreted labs  CBC with leukocytosis, anemia present but stable  CMP with elevated cr, consistent with prior, lfts at baseline Lipase elevated UA not suggestive of UTI VBG at baseline  EKG Sinus rhythm with sinus arrhythmia with occasional Premature ventricular complexes Left axis deviation Non-specific intra-ventricular conduction block Minimal voltage criteria for LVH, may be normal variant ( Cornell product ) Nonspecific T wave abnormality  Abnormal ECG   Reviewed/interpreted imaging CXR - No acute disease. Cardiomegaly CT Abd/pelvis - No acute findings to explain the patient's pain. No evidence of bowel obstruction. Normal appendix. Two lef lower lobe pulmonary nodules, one of which measures 12 mm and stable since November 2020, the other measures 8 mm and is NEW since 01/06/2020. Recommend non-emergent noncontrast CT of the chest as an outpatient to further evaluate as this is partially visualized.   Pt with nv, elevated lipase and leukocytosis. Concern for any early pancreatitis, will admit for further tx.   5:22 PM CONSULT with Dr. Reesa Chew who accepts patient for admission   Final Clinical Impression(s) / ED Diagnoses Final diagnoses:  None    Rx / DC Orders ED Discharge Orders    None       Bishop Dublin 02/16/21 1258    Carmin Muskrat, MD 02/16/21 1839

## 2021-02-15 NOTE — ED Notes (Signed)
Brought pt warm blankets 

## 2021-02-15 NOTE — ED Notes (Signed)
Warm blankets applied to patient.  

## 2021-02-16 ENCOUNTER — Observation Stay (HOSPITAL_BASED_OUTPATIENT_CLINIC_OR_DEPARTMENT_OTHER): Payer: Medicare Other

## 2021-02-16 DIAGNOSIS — I428 Other cardiomyopathies: Secondary | ICD-10-CM

## 2021-02-16 DIAGNOSIS — R112 Nausea with vomiting, unspecified: Secondary | ICD-10-CM | POA: Diagnosis not present

## 2021-02-16 LAB — CBC
HCT: 26.1 % — ABNORMAL LOW (ref 36.0–46.0)
Hemoglobin: 7.8 g/dL — ABNORMAL LOW (ref 12.0–15.0)
MCH: 25.5 pg — ABNORMAL LOW (ref 26.0–34.0)
MCHC: 29.9 g/dL — ABNORMAL LOW (ref 30.0–36.0)
MCV: 85.3 fL (ref 80.0–100.0)
Platelets: 281 10*3/uL (ref 150–400)
RBC: 3.06 MIL/uL — ABNORMAL LOW (ref 3.87–5.11)
RDW: 24.4 % — ABNORMAL HIGH (ref 11.5–15.5)
WBC: 14.4 10*3/uL — ABNORMAL HIGH (ref 4.0–10.5)
nRBC: 0.1 % (ref 0.0–0.2)

## 2021-02-16 LAB — COMPREHENSIVE METABOLIC PANEL
ALT: 22 U/L (ref 0–44)
AST: 26 U/L (ref 15–41)
Albumin: 3 g/dL — ABNORMAL LOW (ref 3.5–5.0)
Alkaline Phosphatase: 54 U/L (ref 38–126)
Anion gap: 10 (ref 5–15)
BUN: 139 mg/dL — ABNORMAL HIGH (ref 8–23)
CO2: 30 mmol/L (ref 22–32)
Calcium: 9.5 mg/dL (ref 8.9–10.3)
Chloride: 98 mmol/L (ref 98–111)
Creatinine, Ser: 2.71 mg/dL — ABNORMAL HIGH (ref 0.44–1.00)
GFR, Estimated: 18 mL/min — ABNORMAL LOW (ref >60)
Glucose, Bld: 191 mg/dL — ABNORMAL HIGH (ref 70–99)
Potassium: 3.8 mmol/L (ref 3.5–5.1)
Sodium: 138 mmol/L (ref 135–145)
Total Bilirubin: 0.5 mg/dL (ref 0.3–1.2)
Total Protein: 6.1 g/dL — ABNORMAL LOW (ref 6.5–8.1)

## 2021-02-16 LAB — GLUCOSE, CAPILLARY
Glucose-Capillary: 174 mg/dL — ABNORMAL HIGH (ref 70–99)
Glucose-Capillary: 179 mg/dL — ABNORMAL HIGH (ref 70–99)
Glucose-Capillary: 209 mg/dL — ABNORMAL HIGH (ref 70–99)
Glucose-Capillary: 188 mg/dL — ABNORMAL HIGH (ref 70–99)

## 2021-02-16 LAB — PROCALCITONIN: Procalcitonin: <0.1 ng/mL

## 2021-02-16 LAB — MAGNESIUM: Magnesium: 2 mg/dL (ref 1.7–2.4)

## 2021-02-16 LAB — CORTISOL: Cortisol, Plasma: 17.4 ug/dL

## 2021-02-16 LAB — ECHOCARDIOGRAM COMPLETE
Area-P 1/2: 2.01 cm2
Height: 63 in
S' Lateral: 3.6 cm
Weight: 3192.26 oz

## 2021-02-16 LAB — T3, FREE: T3, Free: 1 pg/mL — ABNORMAL LOW (ref 2.0–4.4)

## 2021-02-16 LAB — BRAIN NATRIURETIC PEPTIDE: B Natriuretic Peptide: 170.3 pg/mL — ABNORMAL HIGH (ref 0.0–100.0)

## 2021-02-16 MED ORDER — OXYCODONE-ACETAMINOPHEN 5-325 MG PO TABS
1.0000 | ORAL_TABLET | Freq: Four times a day (QID) | ORAL | Status: DC | PRN
  Administered 2021-02-16 – 2021-02-20 (×11): 1 via ORAL
  Filled 2021-02-16 (×11): qty 1

## 2021-02-16 MED ORDER — LEVOTHYROXINE SODIUM 100 MCG PO TABS
200.0000 ug | ORAL_TABLET | Freq: Every day | ORAL | Status: DC
  Administered 2021-02-16 – 2021-02-20 (×5): 200 ug via ORAL
  Filled 2021-02-16 (×6): qty 2

## 2021-02-16 MED ORDER — OXYCODONE HCL 5 MG PO TABS
5.0000 mg | ORAL_TABLET | Freq: Four times a day (QID) | ORAL | Status: DC | PRN
  Administered 2021-02-17 – 2021-02-20 (×11): 5 mg via ORAL
  Filled 2021-02-16 (×11): qty 1

## 2021-02-16 MED ORDER — PANTOPRAZOLE SODIUM 40 MG PO TBEC
40.0000 mg | DELAYED_RELEASE_TABLET | Freq: Two times a day (BID) | ORAL | Status: DC
  Administered 2021-02-16 – 2021-02-20 (×9): 40 mg via ORAL
  Filled 2021-02-16 (×9): qty 1

## 2021-02-16 MED ORDER — OXYCODONE-ACETAMINOPHEN 10-325 MG PO TABS: 1.0000 | ORAL_TABLET | Freq: Four times a day (QID) | ORAL | Status: DC | PRN

## 2021-02-16 NOTE — Progress Notes (Signed)

## 2021-02-16 NOTE — Progress Notes (Signed)
PROGRESS NOTE    [name] [name]  XBJ:[id number] DOB: 1950/11/18 DOA: 02/15/2021 PCP: [name], MD   Brief Narrative:  70 y.o. female with medical history significant of systolic CHF with EF 30%, GERD, erosive gastritis, COPD, hypothyroidism, essential hypertension, iron deficiency anemia, CKD stage V comes to the hospital with complains of nausea and vomiting.  Patient was recently admitted 4 weeks ago for blood loss anemia requiring endoscopy which showed erosive gastritis.  She was discharged on PPI twice daily with outpatient follow-up.  Patient comes in again with complaints of nausea vomiting which was thought to be due to worsening gastric erosion or bowel wall edema.  Lab work revealed severe hypothyroidism.   Assessment & Plan:   Principal Problem:   Intractable nausea and vomiting Active Problems:   PAD (peripheral artery disease) (HCC)   DM (diabetes mellitus) (HCC)   CKD (chronic kidney disease), stage IV (HCC)   Morbid obesity (HCC)   HTN (hypertension)   Chronic CHF (congestive heart failure) (HCC)   Anemia, unspecified  Severe hypothyroidism -  TSH-10, T4 0.5 -  Increase Synthroid to 200 mcg-she has been not compliant with her Synthroid - Check cortisol levels  Nausea and vomiting, intractable-improved - Unclear etiology.  Lipase negative, has history of cholecystectomy.  CT abdomen pelvis negative.  This could be exacerbation of her erosive gastritis versus bowel edema from fluid overload.   - Antiemetics as needed.  Supportive care. - Overall appears to be euvolemic today  Erosive gastritis - Had endoscopy about 4 weeks ago showing erosive gastritis.  We will place her on PPI twice daily, Carafate with meals.  Monitor her symptoms.  Follows outpatient [facility] -  Advance diet as tolerated  Acute congestive heart failure with reduced ejection fraction, 40%.  Class III - Patient has some signs of volume overload with bilateral lower extremity pitting  edema.  Could also have bowel edema causing her nausea and vomiting. - Repeat echocardiogram done-results pending - Hold off on giving IV Lasix today  Lactic acidosis-improving.  Unable to give more fluid due to signs of volume overload  SIRS -  No obvious evidence of infection.  Chest x-ray and UA negative.  Procalcitonin negative.  I suspect she was slightly hypothermic in the ER due to severe hypothyroidism.  Leukocytosis is likely reactive in nature.  CKD stage V - Baseline creatinine 3.0.  Admission creatinine 2.5, today 2.7  History of COPD with tobacco use - As needed bronchodilators  History of CAD - Currently chest pain-free.  Continue Plavix and Coreg  Diabetes mellitus type 2, insulin-dependent - Currently Trulicity on hold.  We will reduce her 7030 to 20 units twice daily - Accu-Cheks and sliding scale   DVT prophylaxis: Subcu heparin Code Status: Full code Family Communication:  Call Ray- no answer. His voicemail box hasnt been set up    Dispo: The patient is from: Home              Anticipated d/c is to: Home              Patient currently is not medically stable to d/c. Still feeling very weak and inconsistent po intake. On going eval for severe hypothyroisism    Difficult to place patient No      Subjective: Sitting up in the chair today, tells me she feels slightly better compared to yesterday. Tells me she has been noncompliant with her Synthroid  Review of Systems Otherwise negative except as per HPI,  including: General: Denies fever, chills, night sweats or unintended weight loss. Resp: Denies cough, wheezing, shortness of breath. Cardiac: Denies chest pain, palpitations, orthopnea, paroxysmal nocturnal dyspnea. GI: Denies abdominal pain, nausea, vomiting, diarrhea or constipation GU: Denies dysuria, frequency, hesitancy or incontinence MS: Denies muscle aches, joint pain or swelling Neuro: Denies headache, neurologic deficits (focal  weakness, numbness, tingling), abnormal gait Psych: Denies anxiety, depression, SI/HI/AVH Skin: Denies new rashes or lesions ID: Denies sick contacts, exotic exposures, travel  Examination:  General exam: Appears calm and comfortable  Respiratory system: Clear to auscultation. Respiratory effort normal. Cardiovascular system: S1 & S2 heard, RRR. No JVD, murmurs, rubs, gallops or clicks.  3+ bilateral lower extremity pitting edema Gastrointestinal system: Abdomen is nondistended, soft and nontender. No organomegaly or masses felt. Normal bowel sounds heard. Central nervous system: Alert and oriented. No focal neurological deficits. Extremities: Symmetric 5 x 5 power. Skin: No rashes, lesions or ulcers Psychiatry: Judgement and insight appear normal. Mood & affect appropriate.     Objective: Vitals:   02/16/21 0500 02/16/21 0815 02/16/21 1133 02/16/21 1351  BP:  (!) 154/87 118/83 (!) 144/70  Pulse:  79 81 73  Resp:  16  20  Temp:  98.4 F (36.9 C)  98 F (36.7 C)  TempSrc:  Oral  Oral  SpO2:  100%  100%  Weight: 90.5 kg     Height:        Intake/Output Summary (Last 24 hours) at 02/16/2021 1441 Last data filed at 02/16/2021 0600 Gross per 24 hour  Intake 1100 ml  Output 1050 ml  Net 50 ml   Filed Weights   02/15/21 1154 02/16/21 0500  Weight: 90.7 kg 90.5 kg     Data Reviewed:   CBC: Recent Labs  Lab 02/15/21 1247 02/15/21 2023 02/16/21 0500  WBC 18.1* 15.1* 14.4*  NEUTROABS 16.3*  --   --   HGB 8.2* 7.8* 7.8*  HCT 28.3* 26.5* 26.1*  MCV 87.1 88.6 85.3  PLT 294 273 793   Basic Metabolic Panel: Recent Labs  Lab 02/15/21 1247 02/15/21 2023 02/16/21 0500  NA 140  --  138  K 3.8  --  3.8  CL 96*  --  98  CO2 28  --  30  GLUCOSE 223*  --  191*  BUN 144*  --  139*  CREATININE 2.57* 2.44* 2.71*  CALCIUM 9.9  --  9.5  MG  --   --  2.0   GFR: Estimated Creatinine Clearance: 20.6 mL/min (A) (by C-G formula based on SCr of 2.71 mg/dL (H)). Liver  Function Tests: Recent Labs  Lab 02/15/21 1247 02/16/21 0500  AST 26 26  ALT 26 22  ALKPHOS 66 54  BILITOT 0.8 0.5  PROT 6.4* 6.1*  ALBUMIN 3.3* 3.0*   Recent Labs  Lab 02/15/21 1247  LIPASE 59*   No results for input(s): AMMONIA in the last 168 hours. Coagulation Profile: No results for input(s): INR, PROTIME in the last 168 hours. Cardiac Enzymes: No results for input(s): CKTOTAL, CKMB, CKMBINDEX, TROPONINI in the last 168 hours. BNP (last 3 results) No results for input(s): PROBNP in the last 8760 hours. HbA1C: Recent Labs    02/15/21 2023  HGBA1C 6.8*   CBG: Recent Labs  Lab 02/15/21 2010 02/16/21 0819 02/16/21 1112  GLUCAP 159* 174* 179*   Lipid Profile: No results for input(s): CHOL, HDL, LDLCALC, TRIG, CHOLHDL, LDLDIRECT in the last 72 hours. Thyroid Function Tests: Recent Labs    02/15/21 1430  02/15/21 1619  TSH 10.814*  --   FREET4  --  0.55*  T3FREE 1.0*  --    Anemia Panel: No results for input(s): VITAMINB12, FOLATE, FERRITIN, TIBC, IRON, RETICCTPCT in the last 72 hours. Sepsis Labs: Recent Labs  Lab 02/15/21 1430 02/15/21 1619 02/15/21 2023 02/16/21 0500  PROCALCITON  --   --  0.13 <0.10  LATICACIDVEN 2.3* 2.1*  --   --     Recent Results (from the past 240 hour(s))  Resp Panel by RT-PCR (Flu A&B, Covid) Nasopharyngeal Swab     Status: None   Collection Time: 02/15/21  1:19 PM   Specimen: Nasopharyngeal Swab; Nasopharyngeal(NP) swabs in vial transport medium  Result Value Ref Range Status   SARS Coronavirus 2 by RT PCR NEGATIVE NEGATIVE Final    Comment: (NOTE) SARS-CoV-2 target nucleic acids are NOT DETECTED.  The SARS-CoV-2 RNA is generally detectable in upper respiratory specimens during the acute phase of infection. The lowest concentration of SARS-CoV-2 viral copies this assay can detect is 138 copies/mL. A negative result does not preclude SARS-Cov-2 infection and should not be used as the sole basis for treatment or other  patient management decisions. A negative result may occur with  improper specimen collection/handling, submission of specimen other than nasopharyngeal swab, presence of viral mutation(s) within the areas targeted by this assay, and inadequate number of viral copies(<138 copies/mL). A negative result must be combined with clinical observations, patient history, and epidemiological information. The expected result is Negative.  Fact Sheet for Patients:  EntrepreneurPulse.com.au  Fact Sheet for Healthcare Providers:  IncredibleEmployment.be  This test is no t yet approved or cleared by the [place] FDA and  has been authorized for detection and/or diagnosis of SARS-CoV-2 by FDA under an Emergency Use Authorization (EUA). This EUA will remain  in effect (meaning this test can be used) for the duration of the COVID-19 declaration under Section 564(b)(1) of the Act, 21 U.S.C.section 360bbb-3(b)(1), unless the authorization is terminated  or revoked sooner.       Influenza A by PCR NEGATIVE NEGATIVE Final   Influenza B by PCR NEGATIVE NEGATIVE Final    Comment: (NOTE) The Xpert Xpress SARS-CoV-2/FLU/RSV plus assay is intended as an aid in the diagnosis of influenza from Nasopharyngeal swab specimens and should not be used as a sole basis for treatment. Nasal washings and aspirates are unacceptable for Xpert Xpress SARS-CoV-2/FLU/RSV testing.  Fact Sheet for Patients: EntrepreneurPulse.com.au  Fact Sheet for Healthcare Providers: IncredibleEmployment.be  This test is not yet approved or cleared by the [place] FDA and has been authorized for detection and/or diagnosis of SARS-CoV-2 by FDA under an Emergency Use Authorization (EUA). This EUA will remain in effect (meaning this test can be used) for the duration of the COVID-19 declaration under Section 564(b)(1) of the Act, 21 U.S.C. section  360bbb-3(b)(1), unless the authorization is terminated or revoked.  Performed at [facility], [place] [address] [place], [place] [address]   Blood culture (routine x 2)     Status: None (Preliminary result)   Collection Time: 02/15/21  2:30 PM   Specimen: BLOOD  Result Value Ref Range Status   Specimen Description   Final    BLOOD RIGHT ANTECUBITAL Performed at [facility] [address], [place], [place] [address]    Special Requests   Final    BOTTLES DRAWN AEROBIC AND ANAEROBIC Blood Culture adequate volume Performed at [facility] [address] [place], [place]  [address]    Culture   Final    NO GROWTH < 12 HOURS Performed at [facility] [address] [place], [place] [address]    Report Status PENDING  Incomplete  Blood culture (routine x 2)     Status: None (Preliminary result)   Collection Time: 02/15/21  2:30 PM   Specimen: BLOOD  Result Value Ref Range Status   Specimen Description   Final    BLOOD LEFT ANTECUBITAL Performed at [facility] [address] [place], [place] [address]    Special Requests   Final    BOTTLES DRAWN AEROBIC AND ANAEROBIC Blood Culture adequate volume Performed at [facility] [address], [place], [place] [address]    Culture   Final    NO GROWTH < 12 HOURS Performed at [facility] [address] [place], [place] [address]    Report Status PENDING  Incomplete         Radiology Studies: CT ABDOMEN PELVIS WO CONTRAST  Result Date: 02/15/2021 CLINICAL DATA:  Nausea/vomiting, generalized abdominal pain EXAM: CT ABDOMEN AND PELVIS WITHOUT CONTRAST TECHNIQUE: Multidetector CT imaging of the abdomen and pelvis was performed following the standard protocol without IV contrast. COMPARISON:  CT 01/06/2020, chest CT 08/10/2019 FINDINGS: Lower chest: There are 2 left lower lobe pulmonary nodules, 1 of which measures 12 mm  and is stable since and November 2020, the other measures 8 mm and is new since recent CT on 01/06/2020 (series 6, image 1 see arrow). This is partially visualized on the first slice on the scan. Hepatobiliary: Unchanged mild hepatomegaly and mildly nodular contour. Prior cholecystectomy. Pancreas: Unremarkable. Spleen: Unremarkable. Adrenals/Urinary Tract: Unchanged bilateral low-density nodular adrenal thickening. Multiple bilateral renal cysts which are unchanged from recent prior exam. Nonspecific bilateral perinephric stranding. No hydronephrosis, nephrolithiasis, or urolithiasis. Stomach/Bowel: Prior left hemicolectomy. No evidence of bowel obstruction. Normal appendix. Scattered colonic diverticuli. Vascular/Lymphatic: Aorto bi-iliac atherosclerotic calcifications. No AAA. Reproductive: Prior hysterectomy. Other: No abdominopelvic ascites. Musculoskeletal: No suspicious lytic or blastic lesions. Multilevel degenerative changes of the spine. Prior posterior and interbody fusion at L4-L5 partially visualized right total hip arthroplasty. IMPRESSION: No acute findings to explain the patient's pain. No evidence of bowel obstruction. Normal appendix. Two lef lower lobe pulmonary nodules, one of which measures 12 mm and stable since November 2020, the other measures 8 mm and is NEW since 01/06/2020. Recommend non-emergent noncontrast CT of the chest as an outpatient to further evaluate as this is partially visualized. Electronically Signed   By: Maurine Simmering   On: 02/15/2021 15:47   DG Chest Portable 1 View  Result Date: 02/15/2021 CLINICAL DATA:  Status post vomiting x2 today. EXAM: PORTABLE CHEST 1 VIEW COMPARISON:  Single-view of the chest 01/05/2021 and 01/06/2020. FINDINGS: There is cardiomegaly without edema. Lungs clear. No pneumothorax or pleural fluid. No acute or focal bony abnormality. IMPRESSION: No acute disease. Cardiomegaly. Electronically Signed   By: Inge Rise M.D.   On: 02/15/2021 14:06         Scheduled Meds: . carvedilol  12.5 mg Oral BID WC  . clopidogrel  75 mg Oral Daily  . heparin  5,000 Units Subcutaneous Q8H  . insulin aspart  0-5 Units Subcutaneous QHS  . insulin aspart  0-9 Units Subcutaneous TID WC  . insulin aspart protamine- aspart  20 Units Subcutaneous BID WC  . levothyroxine  200 mcg Oral Q0600  . pantoprazole  40 mg Oral BID  Continuous Infusions:   LOS: 0 days   Time spent= 35 mins    Tracer Gutridge Arsenio Loader, MD [facility]  If 7PM-7AM, please contact night-coverage  02/16/2021, 2:41 PM

## 2021-02-16 NOTE — Progress Notes (Signed)
  Echocardiogram 2D Echocardiogram has been performed.  [name] [name] 02/16/2021, 12:44 PM

## 2021-02-17 DIAGNOSIS — I132 Hypertensive heart and chronic kidney disease with heart failure and with stage 5 chronic kidney disease, or end stage renal disease: Secondary | ICD-10-CM | POA: Diagnosis present

## 2021-02-17 DIAGNOSIS — E785 Hyperlipidemia, unspecified: Secondary | ICD-10-CM | POA: Diagnosis present

## 2021-02-17 DIAGNOSIS — Z90710 Acquired absence of both cervix and uterus: Secondary | ICD-10-CM | POA: Diagnosis not present

## 2021-02-17 DIAGNOSIS — Z79890 Hormone replacement therapy (postmenopausal): Secondary | ICD-10-CM | POA: Diagnosis not present

## 2021-02-17 DIAGNOSIS — R531 Weakness: Secondary | ICD-10-CM

## 2021-02-17 DIAGNOSIS — N185 Chronic kidney disease, stage 5: Secondary | ICD-10-CM | POA: Diagnosis present

## 2021-02-17 DIAGNOSIS — E1122 Type 2 diabetes mellitus with diabetic chronic kidney disease: Secondary | ICD-10-CM | POA: Diagnosis present

## 2021-02-17 DIAGNOSIS — E890 Postprocedural hypothyroidism: Secondary | ICD-10-CM | POA: Diagnosis present

## 2021-02-17 DIAGNOSIS — Z833 Family history of diabetes mellitus: Secondary | ICD-10-CM | POA: Diagnosis not present

## 2021-02-17 DIAGNOSIS — I5023 Acute on chronic systolic (congestive) heart failure: Secondary | ICD-10-CM | POA: Diagnosis present

## 2021-02-17 DIAGNOSIS — E1151 Type 2 diabetes mellitus with diabetic peripheral angiopathy without gangrene: Secondary | ICD-10-CM | POA: Diagnosis present

## 2021-02-17 DIAGNOSIS — U071 COVID-19: Secondary | ICD-10-CM | POA: Diagnosis not present

## 2021-02-17 DIAGNOSIS — G894 Chronic pain syndrome: Secondary | ICD-10-CM | POA: Diagnosis present

## 2021-02-17 DIAGNOSIS — Z79899 Other long term (current) drug therapy: Secondary | ICD-10-CM | POA: Diagnosis not present

## 2021-02-17 DIAGNOSIS — F17210 Nicotine dependence, cigarettes, uncomplicated: Secondary | ICD-10-CM | POA: Diagnosis present

## 2021-02-17 DIAGNOSIS — M109 Gout, unspecified: Secondary | ICD-10-CM | POA: Diagnosis present

## 2021-02-17 DIAGNOSIS — R112 Nausea with vomiting, unspecified: Secondary | ICD-10-CM | POA: Diagnosis present

## 2021-02-17 DIAGNOSIS — Z794 Long term (current) use of insulin: Secondary | ICD-10-CM | POA: Diagnosis not present

## 2021-02-17 DIAGNOSIS — Z6841 Body Mass Index (BMI) 40.0 and over, adult: Secondary | ICD-10-CM | POA: Diagnosis not present

## 2021-02-17 DIAGNOSIS — K2960 Other gastritis without bleeding: Secondary | ICD-10-CM | POA: Diagnosis present

## 2021-02-17 DIAGNOSIS — J449 Chronic obstructive pulmonary disease, unspecified: Secondary | ICD-10-CM | POA: Diagnosis present

## 2021-02-17 DIAGNOSIS — Z7951 Long term (current) use of inhaled steroids: Secondary | ICD-10-CM | POA: Diagnosis not present

## 2021-02-17 DIAGNOSIS — Z7902 Long term (current) use of antithrombotics/antiplatelets: Secondary | ICD-10-CM | POA: Diagnosis not present

## 2021-02-17 DIAGNOSIS — E872 Acidosis: Secondary | ICD-10-CM | POA: Diagnosis present

## 2021-02-17 DIAGNOSIS — K219 Gastro-esophageal reflux disease without esophagitis: Secondary | ICD-10-CM | POA: Diagnosis present

## 2021-02-17 LAB — COMPREHENSIVE METABOLIC PANEL
ALT: 18 U/L (ref 0–44)
AST: 23 U/L (ref 15–41)
Albumin: 2.8 g/dL — ABNORMAL LOW (ref 3.5–5.0)
Alkaline Phosphatase: 66 U/L (ref 38–126)
Anion gap: 8 (ref 5–15)
BUN: 128 mg/dL — ABNORMAL HIGH (ref 8–23)
CO2: 30 mmol/L (ref 22–32)
Calcium: 9.5 mg/dL (ref 8.9–10.3)
Chloride: 100 mmol/L (ref 98–111)
Creatinine, Ser: 2.79 mg/dL — ABNORMAL HIGH (ref 0.44–1.00)
GFR, Estimated: 18 mL/min — ABNORMAL LOW (ref >60)
Glucose, Bld: 172 mg/dL — ABNORMAL HIGH (ref 70–99)
Potassium: 4.2 mmol/L (ref 3.5–5.1)
Sodium: 138 mmol/L (ref 135–145)
Total Bilirubin: 0.4 mg/dL (ref 0.3–1.2)
Total Protein: 6 g/dL — ABNORMAL LOW (ref 6.5–8.1)

## 2021-02-17 LAB — GLUCOSE, CAPILLARY
Glucose-Capillary: 163 mg/dL — ABNORMAL HIGH (ref 70–99)
Glucose-Capillary: 174 mg/dL — ABNORMAL HIGH (ref 70–99)
Glucose-Capillary: 180 mg/dL — ABNORMAL HIGH (ref 70–99)
Glucose-Capillary: 178 mg/dL — ABNORMAL HIGH (ref 70–99)

## 2021-02-17 LAB — CBC
HCT: 27.4 % — ABNORMAL LOW (ref 36.0–46.0)
Hemoglobin: 8.2 g/dL — ABNORMAL LOW (ref 12.0–15.0)
MCH: 25.8 pg — ABNORMAL LOW (ref 26.0–34.0)
MCHC: 29.9 g/dL — ABNORMAL LOW (ref 30.0–36.0)
MCV: 86.2 fL (ref 80.0–100.0)
Platelets: 217 10*3/uL (ref 150–400)
RBC: 3.18 MIL/uL — ABNORMAL LOW (ref 3.87–5.11)
RDW: 24.6 % — ABNORMAL HIGH (ref 11.5–15.5)
WBC: 13.7 10*3/uL — ABNORMAL HIGH (ref 4.0–10.5)
nRBC: 0.1 % (ref 0.0–0.2)

## 2021-02-17 LAB — PROCALCITONIN: Procalcitonin: 0.12 ng/mL

## 2021-02-17 LAB — CORTISOL-AM, BLOOD: Cortisol - AM: 11.4 ug/dL (ref 6.7–22.6)

## 2021-02-17 LAB — MAGNESIUM: Magnesium: 1.7 mg/dL (ref 1.7–2.4)

## 2021-02-17 MED ORDER — CLOTRIMAZOLE 1 % VA CREA
1.0000 | TOPICAL_CREAM | Freq: Every day | VAGINAL | Status: DC
  Administered 2021-02-17 – 2021-02-18 (×2): 1 via VAGINAL
  Filled 2021-02-17: qty 45

## 2021-02-17 NOTE — Evaluation (Signed)
Occupational Therapy Evaluation Patient Details Name: [name] [name] MRN: [id number] DOB: Oct 15, 1950 Today's Date: 02/17/2021    History of Present Illness Patient is a 70 year old female admitted from home due to weakness, ongoing nausea/vomiting. PMH includes systolic CHF with EF 58%, GERD, erosive gastritis, COPD, hypothyroidism, essential hypertension, iron deficiency anemia, CKD stage V   Clinical Impression   Patient from home with son, had a aide few hours in morning to assist with dressing, bathing, IADL tasks such as making breakfast. Patient reports going to rehab for a few days but had bad experience. Since being home for "few weeks" per patient having significant difficulty getting up to the bathroom/ambulating with walker. Patient's son tries to assist as he can but sometimes works until 7pm. Currently patient presents with weakness, limited activity tolerance requiring mod A x1-2 to power up to standing from elevated bed and then min A with rolling walker and chair follow to ambulate ~5 feet with significant time and shuffling steps. Due to high fall risk and weakness recommend ST rehab prior to D/C home, acute OT to follow.    Follow Up Recommendations  SNF    Equipment Recommendations  None recommended by OT       Precautions / Restrictions Precautions Precautions: Fall Precaution Comments: R foot swollen Restrictions Weight Bearing Restrictions: No      Mobility Bed Mobility               General bed mobility comments: seated EOB upon arrival    Transfers Overall transfer level: Needs assistance Equipment used: Rolling walker (2 wheeled) Transfers: Sit to/from Stand Sit to Stand: Mod assist;From elevated surface;+2 safety/equipment         General transfer comment: mod A x1-2 to power up to standing from elevated bed height, then min A with chair follow for ~5 feet ambulation needing significant time    Balance Overall balance assessment: Needs  assistance Sitting-balance support: Feet supported Sitting balance-Leahy Scale: Good     Standing balance support: Bilateral upper extremity supported Standing balance-Leahy Scale: Poor Standing balance comment: reliant on UE support                           ADL either performed or assessed with clinical judgement   ADL Overall ADL's : Needs assistance/impaired     Grooming: Set up;Sitting   Upper Body Bathing: Minimal assistance;Sitting   Lower Body Bathing: Maximal assistance;Sitting/lateral leans   Upper Body Dressing : Moderate assistance;Sitting   Lower Body Dressing: Total assistance;Sitting/lateral leans Lower Body Dressing Details (indicate cue type and reason): don socks, has assist at baseline with dressing Toilet Transfer: Moderate assistance;Minimal assistance;+2 for safety/equipment;BSC;RW Toilet Transfer Details (indicate cue type and reason): mod A x1-2 to power up to standing from elevated bed height then min A with chair follow for ambulation however patient needs significant time to ambulate few feet then fatigues Toileting- Clothing Manipulation and Hygiene: Maximal assistance;Sitting/lateral lean;Sit to/from stand       Functional mobility during ADLs: Minimal assistance;Moderate assistance;+2 for safety/equipment;Rolling walker;Cueing for safety;Cueing for sequencing General ADL Comments: patient currently high fall risk with decreased safety and activity tolerance needed to transfer to bedside commode                  Pertinent Vitals/Pain Pain Assessment: Faces Faces Pain Scale: Hurts even more Pain Location: low back Pain Descriptors / Indicators: Throbbing;Aching;Sore Pain Intervention(s): Monitored during session  Hand Dominance Right   Extremity/Trunk Assessment Upper Extremity Assessment Upper Extremity Assessment: Generalized weakness;RUE deficits/detail RUE Deficits / Details: limited shoulder AROM ~ 60 degrees. pain in  R UE due to prior IV site RUE: Unable to fully assess due to pain   Lower Extremity Assessment Lower Extremity Assessment: Defer to PT evaluation   Cervical / Trunk Assessment Cervical / Trunk Assessment: Kyphotic   Communication Communication Communication: No difficulties   Cognition Arousal/Alertness: Awake/alert Behavior During Therapy: WFL for tasks assessed/performed Overall Cognitive Status: Within Functional Limits for tasks assessed                                 General Comments: very pleasant, tangential              Home Living Family/patient expects to be discharged to:: Private residence Living Arrangements: Children (son) Available Help at Discharge: Family;Personal care attendant;Available PRN/intermittently Type of Home: Apartment Home Access: Stairs to enter Entrance Stairs-Number of Steps: 3 Entrance Stairs-Rails: Right Home Layout: One level     Bathroom Shower/Tub: Tub/shower unit         Home Equipment: Environmental consultant - 2 wheels;Bedside commode;Tub bench;Cane - single point;Walker - 4 wheels;Electric scooter   Additional Comments: aide present 3 hrs/day for 7 days/wk      Prior Functioning/Environment Level of [place]: Needs assistance  Gait / Transfers Assistance Needed: patient states uses electric scooter for outdoor mobility, uses walker for inside house but having increased trouble ambulating ADL's / Homemaking Assistance Needed: patient reports aide assists with dressing, bathing, household IADLs            OT Problem List: Pain;Obesity;Decreased strength;Impaired balance (sitting and/or standing);Decreased activity tolerance;Decreased range of motion;Decreased safety awareness      OT Treatment/Interventions: Self-care/ADL training;Therapeutic exercise;Therapeutic activities;Patient/family education;Balance training    OT Goals(Current goals can be found in the care plan section) Acute Rehab OT Goals Patient Stated  Goal: get stronger OT Goal Formulation: With patient Time For Goal Achievement: 03/03/21 Potential to Achieve Goals: Good  OT Frequency: Min 2X/week   Barriers to D/C: Decreased caregiver support  son works sometimes until 7 at night and aide only available few hours in morning       Co-evaluation PT/OT/SLP Co-Evaluation/Treatment: Yes Reason for Co-Treatment: To address functional/ADL transfers;For patient/therapist safety PT goals addressed during session: Mobility/safety with mobility OT goals addressed during session: ADL's and self-care      AM-PAC OT "6 Clicks" Daily Activity     Outcome Measure Help from another person eating meals?: None Help from another person taking care of personal grooming?: A Little Help from another person toileting, which includes using toliet, bedpan, or urinal?: A Lot Help from another person bathing (including washing, rinsing, drying)?: A Lot Help from another person to put on and taking off regular upper body clothing?: A Lot Help from another person to put on and taking off regular lower body clothing?: Total 6 Click Score: 14   End of Session Equipment Utilized During Treatment: Rolling walker Nurse Communication: Mobility status  Activity Tolerance: Patient tolerated treatment well Patient left: in chair;with call bell/phone within reach  OT Visit Diagnosis: Muscle weakness (generalized) (M62.81);History of falling (Z91.81);Other abnormalities of gait and mobility (R26.89);Unsteadiness on feet (R26.81);Pain Pain - part of body:  (back)                Time: 1020-1051 OT Time Calculation (min): 31 min Charges:  OT General Charges $OT Visit: 1 Visit OT Evaluation $OT Eval Low Complexity: [place] OT OT pager: [place] 02/17/2021, 11:50 AM

## 2021-02-17 NOTE — Progress Notes (Addendum)
PROGRESS NOTE    [name] [name]  FXT:[id number] DOB: [birth year] DOA: 02/15/2021 PCP: [name], MD   Brief Narrative:  70 y.o. female with medical history significant of systolic CHF with EF 29%, GERD, erosive gastritis, COPD, hypothyroidism, essential hypertension, iron deficiency anemia, CKD stage V comes to the hospital with complains of nausea and vomiting.  Patient was recently admitted 4 weeks ago for blood loss anemia requiring endoscopy which showed erosive gastritis.  She was discharged on PPI twice daily with outpatient follow-up.  Patient comes in again with complaints of nausea vomiting which was thought to be due to worsening gastric erosion or bowel wall edema.  Lab work revealed severe hypothyroidism.  Assessment & Plan:   Principal Problem:   Intractable nausea and vomiting Active Problems:   PAD (peripheral artery disease) (HCC)   DM (diabetes mellitus) (HCC)   CKD (chronic kidney disease), stage IV (HCC)   Morbid obesity (HCC)   HTN (hypertension)   Chronic CHF (congestive heart failure) (HCC)   Anemia, unspecified  Severe hypothyroidism -  TSH-10, T4 0.5 -  Increased Synthroid to 200 mcg-she has been not compliant with her Synthroid - Cortisol random & am = Normal.   Nausea and vomiting, intractable-improved - Unclear etiology.  Lipase negative, has history of cholecystectomy.  CT abdomen pelvis negative.  This could be exacerbation of her erosive gastritis versus bowel edema from fluid overload.   - Antiemetics as needed.  Supportive care. - Euvolemic  Erosive gastritis - Had endoscopy about 4 weeks ago showing erosive gastritis.  We will place her on PPI twice daily, Carafate with meals.  Monitor her symptoms.  Follows outpatient [facility] -  Advance diet as tolerated  Acute congestive heart failure with reduced ejection fraction, 40%.  Class III - Patient has some signs of volume overload with bilateral lower extremity pitting edema.  Could also  have bowel edema causing her nausea and vomiting. - Repeat echocardiogram done-results pending - Hold off on giving IV Lasix today  Lactic acidosis-improving.  Unable to give more fluid due to signs of volume overload  SIRS -  No obvious evidence of infection.  Chest x-ray and UA negative.  Procalcitonin negative.  I suspect she was slightly hypothermic in the ER due to severe hypothyroidism.  Leukocytosis is likely reactive in nature.  CKD stage V - Baseline creatinine 3.0.  Admission creatinine 2.5, today 2.7  History of COPD with tobacco use - As needed bronchodilators  History of CAD - Currently chest pain-free.  Continue Plavix and Coreg  Diabetes mellitus type 2, insulin-dependent - Currently Trulicity on hold.   7030 to 20 units twice daily - Accu-Cheks and sliding scale  Very weak appearing. Pt/ot recommended SNF. TOC consult placed.    DVT prophylaxis: Subcu heparin Code Status: Full code Family Communication:  Call Ray- no answer. His voicemail box hasnt been set up    Dispo: The patient is from: Home              Anticipated d/c is to: SNF               Family is considering rehad at this time in the meantime. Continue to monitor her po intake, renal function here. Continue monitoring culture data and WBC as well.    Difficult to place patient No  Subjective: Sitting up in the chair today, tells me she feels slightly better compared to yesterday. Tells me she has been noncompliant with her Synthroid  Review  of Systems Otherwise negative except as per HPI, including: General = no fevers, chills, dizziness,  fatigue HEENT/EYES = negative for loss of vision, double vision, blurred vision,  sore throa Cardiovascular= negative for chest pain, palpitation Respiratory/lungs= negative for shortness of breath, cough, wheezing; hemoptysis,  Gastrointestinal= negative for nausea, vomiting, abdominal pain Genitourinary= negative for Dysuria MSK = Negative for  arthralgia, myalgias Neurology= Negative for headache, numbness, tingling  Psychiatry= Negative for suicidal and homocidal ideation Skin= Negative for Rash  Examination:  Constitutional: Not in acute distress Respiratory: Clear to auscultation bilaterally Cardiovascular: Normal sinus rhythm, no rubs Abdomen: Nontender nondistended good bowel sounds Musculoskeletal: non pitting edema b/l  Skin: No rashes seen Neurologic: CN 2-12 grossly intact.  And nonfocal Psychiatric: Normal judgment and insight. Alert and oriented x 3. Normal mood.     Objective: Vitals:   02/16/21 1600 02/16/21 2136 02/17/21 0537 02/17/21 1220  BP:  (!) 159/72 (!) 155/80   Pulse:  78 80   Resp:  16 17   Temp:  98.7 F (37.1 C) 98.4 F (36.9 C)   TempSrc:  Oral Oral   SpO2: 100% 92% 100% 99%  Weight:      Height:        Intake/Output Summary (Last 24 hours) at 02/17/2021 1224 Last data filed at 02/17/2021 0500 Gross per 24 hour  Intake --  Output 550 ml  Net -550 ml   Filed Weights   02/15/21 1154 02/16/21 0500  Weight: 90.7 kg 90.5 kg     Data Reviewed:   CBC: Recent Labs  Lab 02/15/21 1247 02/15/21 2023 02/16/21 0500 02/17/21 0550  WBC 18.1* 15.1* 14.4* 13.7*  NEUTROABS 16.3*  --   --   --   HGB 8.2* 7.8* 7.8* 8.2*  HCT 28.3* 26.5* 26.1* 27.4*  MCV 87.1 88.6 85.3 86.2  PLT 294 273 281 371   Basic Metabolic Panel: Recent Labs  Lab 02/15/21 1247 02/15/21 2023 02/16/21 0500 02/17/21 0550  NA 140  --  138 138  K 3.8  --  3.8 4.2  CL 96*  --  98 100  CO2 28  --  30 30  GLUCOSE 223*  --  191* 172*  BUN 144*  --  139* 128*  CREATININE 2.57* 2.44* 2.71* 2.79*  CALCIUM 9.9  --  9.5 9.5  MG  --   --  2.0 1.7   GFR: Estimated Creatinine Clearance: 20 mL/min (A) (by C-G formula based on SCr of 2.79 mg/dL (H)). Liver Function Tests: Recent Labs  Lab 02/15/21 1247 02/16/21 0500 02/17/21 0550  AST 26 26 23   ALT 26 22 18   ALKPHOS 66 54 66  BILITOT 0.8 0.5 0.4  PROT 6.4*  6.1* 6.0*  ALBUMIN 3.3* 3.0* 2.8*   Recent Labs  Lab 02/15/21 1247  LIPASE 59*   No results for input(s): AMMONIA in the last 168 hours. Coagulation Profile: No results for input(s): INR, PROTIME in the last 168 hours. Cardiac Enzymes: No results for input(s): CKTOTAL, CKMB, CKMBINDEX, TROPONINI in the last 168 hours. BNP (last 3 results) No results for input(s): PROBNP in the last 8760 hours. HbA1C: Recent Labs    02/15/21 2023  HGBA1C 6.8*   CBG: Recent Labs  Lab 02/16/21 0819 02/16/21 1112 02/16/21 1635 02/16/21 2138 02/17/21 0820  GLUCAP 174* 179* 209* 188* 163*   Lipid Profile: No results for input(s): CHOL, HDL, LDLCALC, TRIG, CHOLHDL, LDLDIRECT in the last 72 hours. Thyroid Function Tests: Recent Labs  02/15/21 1430 02/15/21 1619  TSH 10.814*  --   FREET4  --  0.55*  T3FREE 1.0*  --    Anemia Panel: No results for input(s): VITAMINB12, FOLATE, FERRITIN, TIBC, IRON, RETICCTPCT in the last 72 hours. Sepsis Labs: Recent Labs  Lab 02/15/21 1430 02/15/21 1619 02/15/21 2023 02/16/21 0500 02/17/21 0746  PROCALCITON  --   --  0.13 <0.10 0.12  LATICACIDVEN 2.3* 2.1*  --   --   --     Recent Results (from the past 240 hour(s))  Resp Panel by RT-PCR (Flu A&B, Covid) Nasopharyngeal Swab     Status: None   Collection Time: 02/15/21  1:19 PM   Specimen: Nasopharyngeal Swab; Nasopharyngeal(NP) swabs in vial transport medium  Result Value Ref Range Status   SARS Coronavirus 2 by RT PCR NEGATIVE NEGATIVE Final    Comment: (NOTE) SARS-CoV-2 target nucleic acids are NOT DETECTED.  The SARS-CoV-2 RNA is generally detectable in upper respiratory specimens during the acute phase of infection. The lowest concentration of SARS-CoV-2 viral copies this assay can detect is 138 copies/mL. A negative result does not preclude SARS-Cov-2 infection and should not be used as the sole basis for treatment or other patient management decisions. A negative result may occur  with  improper specimen collection/handling, submission of specimen other than nasopharyngeal swab, presence of viral mutation(s) within the areas targeted by this assay, and inadequate number of viral copies(<138 copies/mL). A negative result must be combined with clinical observations, patient history, and epidemiological information. The expected result is Negative.  Fact Sheet for Patients:  EntrepreneurPulse.com.au  Fact Sheet for Healthcare Providers:  IncredibleEmployment.be  This test is no t yet approved or cleared by the [place] FDA and  has been authorized for detection and/or diagnosis of SARS-CoV-2 by FDA under an Emergency Use Authorization (EUA). This EUA will remain  in effect (meaning this test can be used) for the duration of the COVID-19 declaration under Section 564(b)(1) of the Act, 21 U.S.C.section 360bbb-3(b)(1), unless the authorization is terminated  or revoked sooner.       Influenza A by PCR NEGATIVE NEGATIVE Final   Influenza B by PCR NEGATIVE NEGATIVE Final    Comment: (NOTE) The Xpert Xpress SARS-CoV-2/FLU/RSV plus assay is intended as an aid in the diagnosis of influenza from Nasopharyngeal swab specimens and should not be used as a sole basis for treatment. Nasal washings and aspirates are unacceptable for Xpert Xpress SARS-CoV-2/FLU/RSV testing.  Fact Sheet for Patients: EntrepreneurPulse.com.au  Fact Sheet for Healthcare Providers: IncredibleEmployment.be  This test is not yet approved or cleared by the [place] FDA and has been authorized for detection and/or diagnosis of SARS-CoV-2 by FDA under an Emergency Use Authorization (EUA). This EUA will remain in effect (meaning this test can be used) for the duration of the COVID-19 declaration under Section 564(b)(1) of the Act, 21 U.S.C. section 360bbb-3(b)(1), unless the authorization is terminated  or revoked.  Performed at [facility], [place] [address], [place], [place] [address]   Blood culture (routine x 2)     Status: None (Preliminary result)   Collection Time: 02/15/21  2:30 PM   Specimen: BLOOD  Result Value Ref Range Status   Specimen Description   Final    BLOOD RIGHT ANTECUBITAL Performed at [facility] [address], [place], [place] [address]    Special Requests   Final    BOTTLES DRAWN AEROBIC AND ANAEROBIC Blood Culture adequate volume Performed at [facility], [place] [address] [place], [place] [address]    Culture   Final    NO GROWTH < 24 HOURS Performed at [facility] [address], [place], [place] [address]    Report Status PENDING  Incomplete  Blood culture (routine x 2)     Status: None (Preliminary result)   Collection Time: 02/15/21  2:30 PM   Specimen: BLOOD  Result Value Ref Range Status   Specimen Description   Final    BLOOD LEFT ANTECUBITAL Performed at [facility] [address] [place], [place] [address]    Special Requests   Final    BOTTLES DRAWN AEROBIC AND ANAEROBIC Blood Culture adequate volume Performed at [facility] [address], [place], [place] [address]    Culture   Final    NO GROWTH < 24 HOURS Performed at [facility] [address], [place], [place] [address]    Report Status PENDING  Incomplete         Radiology Studies: CT ABDOMEN PELVIS WO CONTRAST  Result Date: 02/15/2021 CLINICAL DATA:  Nausea/vomiting, generalized abdominal pain EXAM: CT ABDOMEN AND PELVIS WITHOUT CONTRAST TECHNIQUE: Multidetector CT imaging of the abdomen and pelvis was performed following the standard protocol without IV contrast. COMPARISON:  CT 01/06/2020, chest CT 08/10/2019 FINDINGS: Lower chest: There are 2 left lower lobe pulmonary nodules, 1 of which measures 12 mm and is stable since and November 2020, the other  measures 8 mm and is new since recent CT on 01/06/2020 (series 6, image 1 see arrow). This is partially visualized on the first slice on the scan. Hepatobiliary: Unchanged mild hepatomegaly and mildly nodular contour. Prior cholecystectomy. Pancreas: Unremarkable. Spleen: Unremarkable. Adrenals/Urinary Tract: Unchanged bilateral low-density nodular adrenal thickening. Multiple bilateral renal cysts which are unchanged from recent prior exam. Nonspecific bilateral perinephric stranding. No hydronephrosis, nephrolithiasis, or urolithiasis. Stomach/Bowel: Prior left hemicolectomy. No evidence of bowel obstruction. Normal appendix. Scattered colonic diverticuli. Vascular/Lymphatic: Aorto bi-iliac atherosclerotic calcifications. No AAA. Reproductive: Prior hysterectomy. Other: No abdominopelvic ascites. Musculoskeletal: No suspicious lytic or blastic lesions. Multilevel degenerative changes of the spine. Prior posterior and interbody fusion at L4-L5 partially visualized right total hip arthroplasty. IMPRESSION: No acute findings to explain the patient's pain. No evidence of bowel obstruction. Normal appendix. Two lef lower lobe pulmonary nodules, one of which measures 12 mm and stable since November 2020, the other measures 8 mm and is NEW since 01/06/2020. Recommend non-emergent noncontrast CT of the chest as an outpatient to further evaluate as this is partially visualized. Electronically Signed   By: Maurine Simmering   On: 02/15/2021 15:47   DG Chest Portable 1 View  Result Date: 02/15/2021 CLINICAL DATA:  Status post vomiting x2 today. EXAM: PORTABLE CHEST 1 VIEW COMPARISON:  Single-view of the chest 01/05/2021 and 01/06/2020. FINDINGS: There is cardiomegaly without edema. Lungs clear. No pneumothorax or pleural fluid. No acute or focal bony abnormality. IMPRESSION: No acute disease. Cardiomegaly. Electronically Signed   By: Inge Rise M.D.   On: 02/15/2021 14:06   ECHOCARDIOGRAM COMPLETE  Result Date:  02/16/2021    ECHOCARDIOGRAM REPORT   Patient Name:   [name] [name] Date of Exam: 02/16/2021 Medical Rec #:  [id number]         Height:       63.0 in Accession #:    [id number]        Weight:       199.5 lb Date of Birth:  1951/05/06          BSA:          1.932 m Patient Age:    89 years          BP:           118/83 mmHg Patient Gender: F                 HR:           61 bpm. Exam Location:  Inpatient Procedure: 2D Echo, Cardiac Doppler and Color Doppler Indications:    I42.9 Cardiomyopathy (unspecified)  History:        Patient has prior history of Echocardiogram examinations, most                 recent 03/18/2019. CHF, COPD and TIA, Signs/Symptoms:Dyspnea;                 Risk Factors:Hypertension, Diabetes, Dyslipidemia and Sleep                 Apnea. CKD. GERD. Hypothroidism.  Sonographer:    Jonelle Sidle Dance Referring Phys: [id number] Ericberto Padget CHIRAG Yaretzy Olazabal IMPRESSIONS  1. Left ventricular ejection fraction, by estimation, is 45 to 50%. The left ventricle has mildly decreased function. The left ventricle has no regional wall motion abnormalities. There is moderate left ventricular hypertrophy. Left ventricular diastolic parameters are consistent with Grade I diastolic dysfunction (impaired relaxation).  2. Right ventricular systolic function is normal. The right ventricular size is normal. There is normal pulmonary artery systolic pressure.  3. Left atrial size was severely dilated.  4. The mitral valve is normal in structure. Mild mitral valve regurgitation.  5. The aortic valve is normal in structure. Aortic valve regurgitation is not visualized. FINDINGS  Left Ventricle: Left ventricular ejection fraction, by estimation, is 45 to 50%. The left ventricle has mildly decreased function. The left ventricle has no regional wall motion abnormalities. The left ventricular internal cavity size was normal in size. There is moderate left ventricular hypertrophy. Left ventricular diastolic parameters are consistent with Grade  I diastolic dysfunction (impaired relaxation). Right Ventricle: The right ventricular size is normal. No increase in right ventricular wall thickness. Right ventricular systolic function is normal. There is normal pulmonary artery systolic pressure. The tricuspid regurgitant velocity is 2.21 m/s, and  with an assumed right atrial pressure of 3 mmHg, the estimated right ventricular systolic pressure is 84.6 mmHg. Left Atrium: Left atrial size was severely dilated. Right Atrium: Right atrial size was normal in size. Pericardium: There is no evidence of pericardial effusion. Mitral Valve: The mitral valve is normal in structure. Mild mitral valve regurgitation. Tricuspid Valve: The tricuspid valve is normal in structure. Tricuspid valve regurgitation is mild . No evidence of tricuspid stenosis. Aortic Valve: The aortic valve is normal in structure. Aortic valve regurgitation is not visualized. Pulmonic Valve: The pulmonic valve was normal in structure. Pulmonic valve regurgitation is not visualized. Aorta: The aortic root and ascending aorta are structurally normal, with no evidence of dilitation. IAS/Shunts: The atrial septum is grossly normal.  LEFT VENTRICLE PLAX 2D LVIDd:         5.10 cm LVIDs:         3.60 cm LV PW:         1.40 cm LV IVS:        1.20 cm LVOT diam:     2.10 cm LV SV:         59 LV SV Index:  30 LVOT Area:     3.46 cm  RIGHT VENTRICLE             IVC RV Basal diam:  2.60 cm     IVC diam: 1.60 cm RV S prime:     13.60 cm/s TAPSE (M-mode): 1.8 cm LEFT ATRIUM             Index       RIGHT ATRIUM           Index LA diam:        4.80 cm 2.48 cm/m  RA Area:     14.30 cm LA Vol (A2C):   91.3 ml 47.27 ml/m RA Volume:   34.60 ml  17.91 ml/m LA Vol (A4C):   79.2 ml 41.00 ml/m LA Biplane Vol: 86.4 ml 44.73 ml/m  AORTIC VALVE LVOT Vmax:   73.37 cm/s LVOT Vmean:  50.933 cm/s LVOT VTI:    0.170 m  AORTA Ao Root diam: 3.20 cm Ao Asc diam:  2.90 cm MITRAL VALVE               TRICUSPID VALVE MV Area (PHT):  2.01 cm    TR Peak grad:   19.5 mmHg MV Decel Time: 377 msec    TR Vmax:        221.00 cm/s MV E velocity: 58.10 cm/s MV A velocity: 98.50 cm/s  SHUNTS MV E/A ratio:  0.59        Systemic VTI:  0.17 m                            Systemic Diam: 2.10 cm Mertie Moores MD Electronically signed by Mertie Moores MD Signature Date/Time: 02/16/2021/2:54:54 PM    Final         Scheduled Meds: . carvedilol  12.5 mg Oral BID WC  . clopidogrel  75 mg Oral Daily  . heparin  5,000 Units Subcutaneous Q8H  . insulin aspart  0-5 Units Subcutaneous QHS  . insulin aspart  0-9 Units Subcutaneous TID WC  . insulin aspart protamine- aspart  20 Units Subcutaneous BID WC  . levothyroxine  200 mcg Oral Q0600  . pantoprazole  40 mg Oral BID   Continuous Infusions:   LOS: 0 days   Time spent= 35 mins    Verona Hartshorn Arsenio Loader, MD [facility]  If 7PM-7AM, please contact night-coverage  02/17/2021, 12:24 PM

## 2021-02-17 NOTE — NC FL2 (Signed)
[place] LEVEL OF CARE SCREENING TOOL     IDENTIFICATION  Patient Name: [name] [name] Birthdate: 01-19-1951 Sex: female Admission Date (Current Location): 02/15/2021  [place] and [place] Number:  [name] [name] [id number] [place] and Address:  [facility],  [place], [place]      Provider Number: [id number]  Attending Physician Name and Address:  Damita Lack, MD  Relative Name and Phone Number:  Phifer,Ray Son   [phone]    Current Level of Care: Hospital Recommended Level of Care: [place] Prior Approval Number:    Date Approved/Denied:   PASRR Number: [id number]  Discharge Plan: SNF    Current Diagnoses: Patient Active Problem List   Diagnosis Date Noted  . Intractable nausea and vomiting 02/15/2021  . Symptomatic anemia 01/24/2021  . Anemia, unspecified 01/08/2020  . Bacteremia 01/07/2020  . UTI (urinary tract infection) 01/06/2020  . Anemia 10/26/2019  . S/P lumbar fusion 09/18/2019  . Chronic CHF (congestive heart failure) ([place]) 03/16/2019  . Leukocytosis 03/16/2019  . Chest pain 11/27/2018  . Pre-syncope 11/27/2018  . Epigastric abdominal pain 11/27/2018  . Solitary pulmonary nodule 11/06/2018  . Hypertensive heart and renal disease, stage 1-4 or unspecified chronic kidney disease, with heart failure ([place]) 10/20/2018  . CHF (congestive heart failure), NYHA class III, acute on chronic, systolic ([place]) 27/78/2423  . 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 Cellulitis of abdominal wall 03/21/2015  . Acute on chronic renal failure ([place]) 03/21/2015  . DM (diabetes mellitus), type 2 with renal complications ([place]) 53/61/4431  . Abdominal wall abscess 03/21/2015  . Failed total hip arthroplasty ([place]) 08/11/2014  . Low urine output 08/11/2014  . Acute on chronic systolic CHF (congestive heart failure), NYHA class 3 ([place])  08/11/2014  . Asthma, chronic 08/11/2014  . Hypothyroidism 08/11/2014  . OSA (obstructive sleep apnea) 08/11/2014  . Positive cardiac stress test 06/29/2014  . Hyperlipidemia 02/12/2014  . Left arm weakness 12/23/2013  . TIA (transient ischemic attack) 12/23/2013  . Left buttock abscess 10/12/2013  . Cellulitis and abscess of leg, right 04/16/2013  . Acute blood loss anemia 02/14/2013  . HTN (hypertension) 02/14/2013  . Cellulitis and abscess of buttock 02/09/2013  . Morbid obesity ([place]) 02/03/2013  . Diarrhea 02/03/2013  . Hyponatremia 02/03/2013  . DM (diabetes mellitus) ([place]) 02/02/2013  . CKD (chronic kidney disease), stage IV ([place]) 02/02/2013  . Cellulitis 02/02/2013  . PAD (peripheral artery disease) ([place]) 04/26/2011  . Tobacco abuse 04/26/2011    Orientation RESPIRATION BLADDER Height & Weight     Self,Time,Situation,Place  Normal Incontinent (purewick currently) Weight: 199 lb 8.3 oz (90.5 kg) Height:  5\' 3"  (160 cm)  BEHAVIORAL SYMPTOMS/MOOD NEUROLOGICAL BOWEL NUTRITION STATUS      Continent    AMBULATORY STATUS COMMUNICATION OF NEEDS Skin   Extensive Assist Verbally Normal                       Personal Care Assistance Level of Assistance  Bathing,Dressing,Feeding Bathing Assistance: Limited assistance Feeding assistance: Independent Dressing Assistance: Limited assistance     Functional Limitations Info  Sight,Hearing,Speech Sight Info: Adequate Hearing Info: Adequate Speech Info: Adequate    SPECIAL CARE FACTORS FREQUENCY  PT (By licensed PT),OT (By licensed OT)     PT Frequency: 5x/wk OT Frequency: 5x/wk            Contractures Contractures Info: Not present    Additional  Factors Info  Code Status,Allergies,Insulin Sliding Scale Code Status Info: Full Allergies Info: see MAR   Insulin Sliding Scale Info: see MAR       Current Medications (02/17/2021):  This is the current hospital active medication list Current  Facility-Administered Medications  Medication Dose Route Frequency Provider Last Rate Last Admin  . acetaminophen (TYLENOL) tablet 650 mg  650 mg Oral Q6H PRN Amin, Ankit Chirag, MD   650 mg at 02/16/21 0620  . albuterol (PROVENTIL) (2.5 MG/3ML) 0.083% nebulizer solution 2.5 mg  2.5 mg Nebulization Q6H PRN Amin, Ankit Chirag, MD   2.5 mg at 02/17/21 1220  . carvedilol (COREG) tablet 12.5 mg  12.5 mg Oral BID WC Amin, Ankit Chirag, MD   12.5 mg at 02/17/21 0905  . clopidogrel (PLAVIX) tablet 75 mg  75 mg Oral Daily Amin, Ankit Chirag, MD   75 mg at 02/17/21 0904  . clotrimazole (GYNE-LOTRIMIN) vaginal cream 1 Applicatorful  1 Applicatorful Vaginal QHS Amin, Ankit Chirag, MD      . dextromethorphan-guaiFENesin ([place] DM) 30-600 MG per 12 hr tablet 1 tablet  1 tablet Oral BID PRN Amin, Ankit Chirag, MD      . heparin injection 5,000 Units  5,000 Units Subcutaneous Q8H Amin, Ankit Chirag, MD   5,000 Units at 02/17/21 0516  . hydrALAZINE (APRESOLINE) injection 10 mg  10 mg Intravenous Q4H PRN Amin, Ankit Chirag, MD      . insulin aspart (novoLOG) injection 0-5 Units  0-5 Units Subcutaneous QHS Amin, Ankit Chirag, MD      . insulin aspart (novoLOG) injection 0-9 Units  0-9 Units Subcutaneous TID WC Amin, Ankit Chirag, MD   2 Units at 02/17/21 1254  . insulin aspart protamine- aspart (NOVOLOG MIX 70/30) injection 20 Units  20 Units Subcutaneous BID WC Amin, Jeanella Flattery, MD   20 Units at 02/17/21 0913  . levothyroxine (SYNTHROID) tablet 200 mcg  200 mcg Oral Q0600 Damita Lack, MD   200 mcg at 02/17/21 0515  . ondansetron (ZOFRAN) tablet 4 mg  4 mg Oral Q6H PRN Amin, Ankit Chirag, MD       Or  . ondansetron (ZOFRAN) injection 4 mg  4 mg Intravenous Q6H PRN Amin, Ankit Chirag, MD      . oxyCODONE (Oxy IR/ROXICODONE) immediate release tablet 5 mg  5 mg Oral Q6H PRN Amin, Ankit Chirag, MD   5 mg at 02/17/21 0904   And  . oxyCODONE-acetaminophen (PERCOCET/ROXICET) 5-325 MG per tablet 1 tablet  1  tablet Oral Q6H PRN Damita Lack, MD   1 tablet at 02/17/21 1254  . pantoprazole (PROTONIX) EC tablet 40 mg  40 mg Oral BID Eudelia Bunch, RPH   40 mg at 02/17/21 0904  . senna-docusate (Senokot-S) tablet 1 tablet  1 tablet Oral QHS PRN Amin, Jeanella Flattery, MD         Discharge Medications: Please see discharge summary for a list of discharge medications.  Relevant Imaging Results:  Relevant Lab Results:   Additional Information SSN [id number]  Lennart Pall, LCSW

## 2021-02-17 NOTE — TOC Initial Note (Addendum)
Transition of Care [facility]) - Initial/Assessment Note    Patient Details  Name: [name] [name] MRN: [id number] Date of Birth: June 15, 1951  [facility] CM/SW Contact:    Lennart Pall, LCSW Phone Number: 02/17/2021, 2:52 PM  Clinical Narrative:                 Met with pt to introduce self/ role and discuss therapy recommendations of SNF for dc plan.  Pt is pleasant and is in agreement with the SNF plan, however, requests that she not return to prior facilities she has been in [facility]; [facility] ).  Also, have spoken with son about the plan and he agreeable as well.  Per MD, is medically ready for transition to SNF when bed is secured.  Will begin insurance authorization and bed search.  Addendum: Insurance auth started with ref# [id number].  FL2 out to facilities. Note that pt has had COVID vaccine but no booster.  Expected Discharge Plan: Skilled Nursing Facility Barriers to Discharge: Continued Medical Work up   Patient Goals and CMS Choice Patient states their goals for this hospitalization and ongoing recovery are:: to eventually return home CMS Medicare.gov Compare Post Acute Care list provided to:: Patient Choice offered to / list presented to : Patient  Expected Discharge Plan and Services Expected Discharge Plan: [place] In-house Referral: Clinical Social Work   Post Acute Care Choice: [place] Living arrangements for the past 2 months: Apartment                 DME Arranged: N/A DME Agency: NA                  Prior Living Arrangements/Services Living arrangements for the past 2 months: Apartment Lives with:: Adult Children Patient language and need for interpreter reviewed:: Yes Do you feel safe going back to the place where you live?: Yes      Need for Family Participation in Patient Care: Yes (Comment) Care giver support system in place?: No (comment) Current home services: DME,Homehealth  aide Criminal Activity/Legal Involvement Pertinent to Current Situation/Hospitalization: No - Comment as needed  Activities of Daily Living Home Assistive Devices/Equipment: Walker (specify type),Bedside commode/3-in-1,Shower chair with back,Eyeglasses,Dentures (specify type) (upper partial) ADL Screening (condition at time of admission) Patient's cognitive ability adequate to safely complete daily activities?: Yes Is the patient deaf or have difficulty hearing?: Yes Does the patient have difficulty seeing, even when wearing glasses/contacts?: Yes (vision blurry at times) Does the patient have difficulty concentrating, remembering, or making decisions?: No Patient able to express need for assistance with ADLs?: Yes Does the patient have difficulty dressing or bathing?: Yes Independently performs ADLs?: No Communication: Independent Dressing (OT): Needs assistance Is this a change from baseline?: Pre-admission baseline Grooming: Independent Feeding: Independent Bathing: Needs assistance Is this a change from baseline?: Pre-admission baseline Toileting: Needs assistance Is this a change from baseline?: Pre-admission baseline In/Out Bed: Needs assistance Is this a change from baseline?: Pre-admission baseline Walks in Home: Needs assistance Is this a change from baseline?: Pre-admission baseline Does the patient have difficulty walking or climbing stairs?: Yes Weakness of Legs: Both Weakness of Arms/Hands: Both  Permission Sought/Granted Permission sought to share information with : Family Supports Permission granted to share information with : Yes, Verbal Permission Granted  Share Information with NAME: Ray Phifer     Permission granted to share info w Relationship: son  Permission granted to share info w Contact Information: [phone]  Emotional  Assessment Appearance:: Appears stated age Attitude/Demeanor/Rapport: Engaged Affect (typically observed): Accepting Orientation:  : Oriented to Self,Oriented to Place,Oriented to  Time,Oriented to Situation Alcohol / Substance Use: Not Applicable Psych Involvement: No (comment)  Admission diagnosis:  Intractable nausea and vomiting [R11.2] Patient Active Problem List   Diagnosis Date Noted  . Intractable nausea and vomiting 02/15/2021  . Symptomatic anemia 01/24/2021  . Anemia, unspecified 01/08/2020  . Bacteremia 01/07/2020  . UTI (urinary tract infection) 01/06/2020  . Anemia 10/26/2019  . S/P lumbar fusion 09/18/2019  . Chronic CHF (congestive heart failure) ([place]) 03/16/2019  . Leukocytosis 03/16/2019  . Chest pain 11/27/2018  . Pre-syncope 11/27/2018  . Epigastric abdominal pain 11/27/2018  . Solitary pulmonary nodule 11/06/2018  . Hypertensive heart and renal disease, stage 1-4 or unspecified chronic kidney disease, with heart failure ([place]) 10/20/2018  . CHF (congestive heart failure), NYHA class III, acute on chronic, systolic ([place]) 48/10/6551  . 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 Cellulitis of abdominal wall 03/21/2015  . Acute on chronic renal failure ([place]) 03/21/2015  . DM (diabetes mellitus), type 2 with renal complications ([place]) 74/82/7078  . Abdominal wall abscess 03/21/2015  . Failed total hip arthroplasty ([place]) 08/11/2014  . Low urine output 08/11/2014  . Acute on chronic systolic CHF (congestive heart failure), NYHA class 3 ([place]) 08/11/2014  . Asthma, chronic 08/11/2014  . Hypothyroidism 08/11/2014  . OSA (obstructive sleep apnea) 08/11/2014  . Positive cardiac stress test 06/29/2014  . Hyperlipidemia 02/12/2014  . Left arm weakness 12/23/2013  . TIA (transient ischemic attack) 12/23/2013  . Left buttock abscess 10/12/2013  . Cellulitis and abscess of leg, right 04/16/2013  . Acute blood loss anemia 02/14/2013  . HTN (hypertension) 02/14/2013  . Cellulitis and abscess of buttock 02/09/2013  . Morbid obesity ([place])  02/03/2013  . Diarrhea 02/03/2013  . Hyponatremia 02/03/2013  . DM (diabetes mellitus) ([place]) 02/02/2013  . CKD (chronic kidney disease), stage IV ([place]) 02/02/2013  . Cellulitis 02/02/2013  . PAD (peripheral artery disease) ([place]) 04/26/2011  . Tobacco abuse 04/26/2011   PCP:  [name], MD Pharmacy:   [facility] [place], [address] [place] [address] Phone: [phone] Fax: [phone]  [facility] [address] [address] Phone: [phone] Fax: [phone]     Social Determinants of Health (SDOH) Interventions    Readmission Risk Interventions Readmission Risk Prevention Plan 01/14/2020  Transportation Screening Complete  Medication Review Press photographer) Complete  PCP or Specialist appointment within 3-5 days of discharge Complete  HRI or Home Care Consult Complete  SW Recovery Care/Counseling Consult Complete  Palliative Care Screening Not [place] Complete  Some recent data might be hidden

## 2021-02-17 NOTE — Evaluation (Signed)
Physical Therapy Evaluation Patient Details Name: [name] [name] MRN: [id number] DOB: Feb 07, 1951 Today's Date: 02/17/2021   History of Present Illness  Patient is a 70 year old female admitted from home due to weakness, ongoing nausea/vomiting. PMH includes systolic CHF with EF 29%, GERD, erosive gastritis, COPD, hypothyroidism, essential hypertension, iron deficiency anemia, CKD stage V  Clinical Impression  Patient from home with son, had a aide few hours in morning to assist with dressing, bathing, IADL tasks such as making breakfast. Patient reports going to rehab for a few days but had bad experience. Since being home for "few weeks" per patient having significant difficulty getting up to the bathroom/ambulating with walker. Patient's son tries to assist as he can but sometimes works until 7pm. Currently patient presents with weakness, limited activity tolerance requiring mod A x1-2 to power up to standing from elevated bed and then min A with rolling walker and chair follow to ambulate ~8 feet with significant time and shuffling steps. Due to high fall risk and weakness recommend ST rehab prior to D/C home, acute OT to follow.    Follow Up Recommendations SNF    Equipment Recommendations  None recommended by PT    Recommendations for Other Services       Precautions / Restrictions Precautions Precautions: Fall Precaution Comments: Bil feet swollen - R>L Restrictions Weight Bearing Restrictions: No      Mobility  Bed Mobility               General bed mobility comments: seated EOB upon arrival    Transfers Overall transfer level: Needs assistance Equipment used: Rolling walker (2 wheeled) Transfers: Sit to/from Stand Sit to Stand: Mod assist;From elevated surface;+2 safety/equipment         General transfer comment: mod assist to bring wt up and fwd and to balance in initial standing with RW  Ambulation/Gait Ambulation/Gait assistance: Min assist;Mod  assist;+2 safety/equipment Gait Distance (Feet): 8 Feet Assistive device: Rolling walker (2 wheeled) Gait Pattern/deviations: Step-to pattern;Decreased stride length;Decreased weight shift to right;Trunk flexed;Wide base of support Gait velocity: decreased   General Gait Details: Increased time with initial assist to advance L LE.  Cues for posture, sequence and position from RW.  Physical assist for balance/support and RW management.  Distance ltd by fatigue and LBP  Stairs            Wheelchair Mobility    Modified Rankin (Stroke Patients Only)       Balance Overall balance assessment: Needs assistance Sitting-balance support: Feet supported Sitting balance-Leahy Scale: Good Sitting balance - Comments: seated EOB   Standing balance support: Bilateral upper extremity supported Standing balance-Leahy Scale: Poor Standing balance comment: reliant on UE support                             Pertinent Vitals/Pain Pain Assessment: Faces Faces Pain Scale: Hurts even more Pain Location: low back Pain Descriptors / Indicators: Throbbing;Aching;Sore Pain Intervention(s): Limited activity within patient's tolerance;Monitored during session    [place] expects to be discharged to:: Private residence Living Arrangements: Children (son) Available Help at Discharge: Family;Personal care attendant;Available PRN/intermittently Type of Home: Apartment Home Access: Stairs to enter Entrance Stairs-Rails: Right Entrance Stairs-Number of Steps: 3 Home Layout: One level Home Equipment: Walker - 2 wheels;Bedside commode;Tub bench;Cane - single point;Walker - 4 wheels;Electric scooter Additional Comments: aide present 3 hrs/day for 7 days/wk; son is available most evenings but sometimes works late  Prior Function Level of Independence: Needs assistance   Gait / Transfers Assistance Needed: patient states uses electric scooter for outdoor mobility, uses  walker for inside house but having increased trouble ambulating  ADL's / Homemaking Assistance Needed: patient reports aide assists with dressing, bathing, household IADLs  Comments: Pt reports most recent fall ~3 weeks ago     Hand Dominance   Dominant Hand: Right    Extremity/Trunk Assessment   Upper Extremity Assessment Upper Extremity Assessment: Generalized weakness RUE Deficits / Details: limited shoulder AROM ~ 60 degrees. pain in R UE due to prior IV site RUE: Unable to fully assess due to pain    Lower Extremity Assessment Lower Extremity Assessment: Generalized weakness    Cervical / Trunk Assessment Cervical / Trunk Assessment: Kyphotic  Communication   Communication: No difficulties  Cognition Arousal/Alertness: Awake/alert Behavior During Therapy: WFL for tasks assessed/performed Overall Cognitive Status: Within Functional Limits for tasks assessed                                 General Comments: very pleasant but tangential      General Comments      Exercises     Assessment/Plan    PT Assessment Patient needs continued PT services  PT Problem List Decreased strength;Decreased range of motion;Decreased activity tolerance;Decreased balance;Decreased mobility;Cardiopulmonary status limiting activity;Obesity;Pain       PT Treatment Interventions DME instruction;Gait training;Functional mobility training;Therapeutic activities;Therapeutic exercise;Balance training;Patient/family education    PT Goals (Current goals can be found in the Care Plan section)  Acute Rehab PT Goals Patient Stated Goal: Regain IND PT Goal Formulation: With patient Time For Goal Achievement: 02/09/21 Potential to Achieve Goals: Good    Frequency Min 3X/week   Barriers to discharge        Co-evaluation PT/OT/SLP Co-Evaluation/Treatment: Yes Reason for Co-Treatment: To address functional/ADL transfers PT goals addressed during session: Mobility/safety  with mobility OT goals addressed during session: ADL's and self-care       AM-PAC PT "6 Clicks" Mobility  Outcome Measure Help needed turning from your back to your side while in a flat bed without using bedrails?: A Lot Help needed moving from lying on your back to sitting on the side of a flat bed without using bedrails?: A Lot Help needed moving to and from a bed to a chair (including a wheelchair)?: A Lot Help needed standing up from a chair using your arms (e.g., wheelchair or bedside chair)?: A Lot Help needed to walk in hospital room?: A Lot Help needed climbing 3-5 steps with a railing? : A Lot 6 Click Score: 12    End of Session Equipment Utilized During Treatment: Gait belt Activity Tolerance: Patient limited by pain;Patient limited by fatigue Patient left: in chair;with call bell/phone within reach;with chair alarm set Nurse Communication: Mobility status PT Visit Diagnosis: Unsteadiness on feet (R26.81);Other abnormalities of gait and mobility (R26.89);Muscle weakness (generalized) (M62.81);Pain;Difficulty in walking, not elsewhere classified (R26.2)    Time: 8588-5027 PT Time Calculation (min) (ACUTE ONLY): 31 min   Charges:   PT Evaluation $PT Eval Low Complexity: 1 Low          [place] Pager [phone] Office [phone]   Keimon Basaldua 02/17/2021, 12:11 PM

## 2021-02-18 LAB — GLUCOSE, CAPILLARY
Glucose-Capillary: 149 mg/dL — ABNORMAL HIGH (ref 70–99)
Glucose-Capillary: 144 mg/dL — ABNORMAL HIGH (ref 70–99)
Glucose-Capillary: 171 mg/dL — ABNORMAL HIGH (ref 70–99)
Glucose-Capillary: 162 mg/dL — ABNORMAL HIGH (ref 70–99)

## 2021-02-18 LAB — CBC
HCT: 23.9 % — ABNORMAL LOW (ref 36.0–46.0)
Hemoglobin: 7 g/dL — ABNORMAL LOW (ref 12.0–15.0)
MCH: 25.9 pg — ABNORMAL LOW (ref 26.0–34.0)
MCHC: 29.3 g/dL — ABNORMAL LOW (ref 30.0–36.0)
MCV: 88.5 fL (ref 80.0–100.0)
Platelets: 273 10*3/uL (ref 150–400)
RBC: 2.7 MIL/uL — ABNORMAL LOW (ref 3.87–5.11)
RDW: 24.6 % — ABNORMAL HIGH (ref 11.5–15.5)
WBC: 11.2 10*3/uL — ABNORMAL HIGH (ref 4.0–10.5)
nRBC: 0 % (ref 0.0–0.2)

## 2021-02-18 LAB — COMPREHENSIVE METABOLIC PANEL
ALT: 16 U/L (ref 0–44)
AST: 22 U/L (ref 15–41)
Albumin: 2.7 g/dL — ABNORMAL LOW (ref 3.5–5.0)
Alkaline Phosphatase: 69 U/L (ref 38–126)
Anion gap: 11 (ref 5–15)
BUN: 125 mg/dL — ABNORMAL HIGH (ref 8–23)
CO2: 26 mmol/L (ref 22–32)
Calcium: 9.5 mg/dL (ref 8.9–10.3)
Chloride: 100 mmol/L (ref 98–111)
Creatinine, Ser: 2.59 mg/dL — ABNORMAL HIGH (ref 0.44–1.00)
GFR, Estimated: 19 mL/min — ABNORMAL LOW (ref >60)
Glucose, Bld: 175 mg/dL — ABNORMAL HIGH (ref 70–99)
Potassium: 3.5 mmol/L (ref 3.5–5.1)
Sodium: 137 mmol/L (ref 135–145)
Total Bilirubin: 0.5 mg/dL (ref 0.3–1.2)
Total Protein: 5.7 g/dL — ABNORMAL LOW (ref 6.5–8.1)

## 2021-02-18 LAB — MAGNESIUM: Magnesium: 1.8 mg/dL (ref 1.7–2.4)

## 2021-02-18 NOTE — Progress Notes (Signed)
PROGRESS NOTE    [name] [name]  XHB:[id number] DOB: 08/13/1951 DOA: 02/15/2021 PCP: [name], MD   Brief Narrative:  70 y.o. female with medical history significant of systolic CHF with EF 81%, GERD, erosive gastritis, COPD, hypothyroidism, essential hypertension, iron deficiency anemia, CKD stage V comes to the hospital with complains of nausea and vomiting.  Patient was recently admitted 4 weeks ago for blood loss anemia requiring endoscopy which showed erosive gastritis.  She was discharged on PPI twice daily with outpatient follow-up.  Patient comes in again with complaints of nausea vomiting which was thought to be due to worsening gastric erosion or bowel wall edema.  Lab work revealed severe hypothyroidism.  Her Synthroid was restarted at 200 mcg daily.  PT/OT recommended SNF.  Assessment & Plan:   Principal Problem:   Intractable nausea and vomiting Active Problems:   PAD (peripheral artery disease) (HCC)   DM (diabetes mellitus) (HCC)   CKD (chronic kidney disease), stage IV (HCC)   Morbid obesity (HCC)   HTN (hypertension)   Chronic CHF (congestive heart failure) (HCC)   Anemia, unspecified   Increased weakness when ambulating  Severe hypothyroidism -  TSH-10, T4 0.5.  Has been noncompliant with her Synthroid -  continue Synthroid 200 mcg daily - Cortisol random & am = Normal.   Nausea and vomiting, intractable- resolved - Unclear etiology.  Lipase negative, has history of cholecystectomy.  CT abdomen pelvis negative.  This could be exacerbation of her erosive gastritis versus bowel edema from fluid overload.   - Antiemetics as needed.  Supportive care. - Euvolemic  Erosive gastritis - Had endoscopy about 4 weeks ago showing erosive gastritis.  We will place her on PPI twice daily, Carafate with meals.  Monitor her symptoms.  Follows outpatient [facility] -  diet as tolerated  Congestive heart failure with reduced ejection fraction, 40%.  Class II - Patient  has some signs of volume overload with bilateral lower extremity pitting edema.  Could also have bowel edema causing her nausea and vomiting. -  Echo 02/2021-EF 50%, grade 1 DD - Hold diuretics.  Appears overall euvolemic  Lactic acidosis-improving.  Unable to give more fluid due to signs of volume overload  SIRS -  Resolved.  CKD stage V - Baseline creatinine 3.0.  Admission creatinine 2.5, today 2.5  History of COPD with tobacco use - As needed bronchodilators  History of CAD - Currently chest pain-free.  Continue Plavix and Coreg  Diabetes mellitus type 2, insulin-dependent - Currently Trulicity on hold.   7030 to 20 units twice daily - Accu-Cheks and sliding scale  Very weak appearing. Pt/ot recommended SNF.   DVT prophylaxis: Subcu heparin Code Status: Full code Family Communication:  Spoke with Son and Husband     Dispo: The patient is from: Home              Anticipated d/c is to: SNF               Family is considering rehad at this time in the meantime. Continue to monitor her po intake, renal function here. Continue monitoring culture data and WBC as well.    Difficult to place patient No  Subjective: Patient still feels generally weak but overall better since the time of admission  Review of Systems Otherwise negative except as per HPI, including: General: Denies fever, chills, night sweats or unintended weight loss. Resp: Denies cough, wheezing, shortness of breath. Cardiac: Denies chest pain, palpitations, orthopnea, paroxysmal nocturnal  dyspnea. GI: Denies abdominal pain, nausea, vomiting, diarrhea or constipation GU: Denies dysuria, frequency, hesitancy or incontinence MS: Denies muscle aches, joint pain or swelling Neuro: Denies headache, neurologic deficits (focal weakness, numbness, tingling), abnormal gait Psych: Denies anxiety, depression, SI/HI/AVH Skin: Denies new rashes or lesions ID: Denies sick contacts, exotic exposures,  travel  Examination: Constitutional: Not in acute distress Respiratory: Clear to auscultation bilaterally Cardiovascular: Normal sinus rhythm, no rubs Abdomen: Nontender nondistended good bowel sounds Musculoskeletal: Nonpitting 3+ bilateral lower extremity edema Skin: No rashes seen Neurologic: CN 2-12 grossly intact.  And nonfocal Psychiatric: Normal judgment and insight. Alert and oriented x 3. Normal mood.   Objective: Vitals:   02/17/21 1434 02/17/21 2132 02/18/21 0425 02/18/21 0427  BP: (!) 144/70 (!) 144/73 (!) 155/80   Pulse: 72 73 70   Resp:  18 18   Temp: 97.9 F (36.6 C) 98 F (36.7 C) 98.4 F (36.9 C)   TempSrc: Oral  Oral   SpO2: 100% 100% 99%   Weight:    102.1 kg  Height:       No intake or output data in the 24 hours ending 02/18/21 0858 Filed Weights   02/15/21 1154 02/16/21 0500 02/18/21 0427  Weight: 90.7 kg 90.5 kg 102.1 kg     Data Reviewed:   CBC: Recent Labs  Lab 02/15/21 1247 02/15/21 2023 02/16/21 0500 02/17/21 0550 02/18/21 0552  WBC 18.1* 15.1* 14.4* 13.7* 11.2*  NEUTROABS 16.3*  --   --   --   --   HGB 8.2* 7.8* 7.8* 8.2* 7.0*  HCT 28.3* 26.5* 26.1* 27.4* 23.9*  MCV 87.1 88.6 85.3 86.2 88.5  PLT 294 273 281 217 149   Basic Metabolic Panel: Recent Labs  Lab 02/15/21 1247 02/15/21 2023 02/16/21 0500 02/17/21 0550 02/18/21 0552  NA 140  --  138 138 137  K 3.8  --  3.8 4.2 3.5  CL 96*  --  98 100 100  CO2 28  --  30 30 26   GLUCOSE 223*  --  191* 172* 175*  BUN 144*  --  139* 128* 125*  CREATININE 2.57* 2.44* 2.71* 2.79* 2.59*  CALCIUM 9.9  --  9.5 9.5 9.5  MG  --   --  2.0 1.7 1.8   GFR: Estimated Creatinine Clearance: 23.1 mL/min (A) (by C-G formula based on SCr of 2.59 mg/dL (H)). Liver Function Tests: Recent Labs  Lab 02/15/21 1247 02/16/21 0500 02/17/21 0550 02/18/21 0552  AST 26 26 23 22   ALT 26 22 18 16   ALKPHOS 66 54 66 69  BILITOT 0.8 0.5 0.4 0.5  PROT 6.4* 6.1* 6.0* 5.7*  ALBUMIN 3.3* 3.0* 2.8* 2.7*    Recent Labs  Lab 02/15/21 1247  LIPASE 59*   No results for input(s): AMMONIA in the last 168 hours. Coagulation Profile: No results for input(s): INR, PROTIME in the last 168 hours. Cardiac Enzymes: No results for input(s): CKTOTAL, CKMB, CKMBINDEX, TROPONINI in the last 168 hours. BNP (last 3 results) No results for input(s): PROBNP in the last 8760 hours. HbA1C: Recent Labs    02/15/21 2023  HGBA1C 6.8*   CBG: Recent Labs  Lab 02/17/21 0820 02/17/21 1240 02/17/21 1633 02/17/21 2144 02/18/21 0812  GLUCAP 163* 174* 180* 178* 149*   Lipid Profile: No results for input(s): CHOL, HDL, LDLCALC, TRIG, CHOLHDL, LDLDIRECT in the last 72 hours. Thyroid Function Tests: Recent Labs    02/15/21 1430 02/15/21 1619  TSH 10.814*  --   FREET4  --  0.55*  T3FREE 1.0*  --    Anemia Panel: No results for input(s): VITAMINB12, FOLATE, FERRITIN, TIBC, IRON, RETICCTPCT in the last 72 hours. Sepsis Labs: Recent Labs  Lab 02/15/21 1430 02/15/21 1619 02/15/21 2023 02/16/21 0500 02/17/21 0746  PROCALCITON  --   --  0.13 <0.10 0.12  LATICACIDVEN 2.3* 2.1*  --   --   --     Recent Results (from the past 240 hour(s))  Resp Panel by RT-PCR (Flu A&B, Covid) Nasopharyngeal Swab     Status: None   Collection Time: 02/15/21  1:19 PM   Specimen: Nasopharyngeal Swab; Nasopharyngeal(NP) swabs in vial transport medium  Result Value Ref Range Status   SARS Coronavirus 2 by RT PCR NEGATIVE NEGATIVE Final    Comment: (NOTE) SARS-CoV-2 target nucleic acids are NOT DETECTED.  The SARS-CoV-2 RNA is generally detectable in upper respiratory specimens during the acute phase of infection. The lowest concentration of SARS-CoV-2 viral copies this assay can detect is 138 copies/mL. A negative result does not preclude SARS-Cov-2 infection and should not be used as the sole basis for treatment or other patient management decisions. A negative result may occur with  improper specimen  collection/handling, submission of specimen other than nasopharyngeal swab, presence of viral mutation(s) within the areas targeted by this assay, and inadequate number of viral copies(<138 copies/mL). A negative result must be combined with clinical observations, patient history, and epidemiological information. The expected result is Negative.  Fact Sheet for Patients:  EntrepreneurPulse.com.au  Fact Sheet for Healthcare Providers:  IncredibleEmployment.be  This test is no t yet approved or cleared by the [place] FDA and  has been authorized for detection and/or diagnosis of SARS-CoV-2 by FDA under an Emergency Use Authorization (EUA). This EUA will remain  in effect (meaning this test can be used) for the duration of the COVID-19 declaration under Section 564(b)(1) of the Act, 21 U.S.C.section 360bbb-3(b)(1), unless the authorization is terminated  or revoked sooner.       Influenza A by PCR NEGATIVE NEGATIVE Final   Influenza B by PCR NEGATIVE NEGATIVE Final    Comment: (NOTE) The Xpert Xpress SARS-CoV-2/FLU/RSV plus assay is intended as an aid in the diagnosis of influenza from Nasopharyngeal swab specimens and should not be used as a sole basis for treatment. Nasal washings and aspirates are unacceptable for Xpert Xpress SARS-CoV-2/FLU/RSV testing.  Fact Sheet for Patients: EntrepreneurPulse.com.au  Fact Sheet for Healthcare Providers: IncredibleEmployment.be  This test is not yet approved or cleared by the [place] FDA and has been authorized for detection and/or diagnosis of SARS-CoV-2 by FDA under an Emergency Use Authorization (EUA). This EUA will remain in effect (meaning this test can be used) for the duration of the COVID-19 declaration under Section 564(b)(1) of the Act, 21 U.S.C. section 360bbb-3(b)(1), unless the authorization is terminated or revoked.  Performed at [facility], [place] [address] [place], [place] [address]   Blood culture (routine x 2)     Status: None (Preliminary result)   Collection Time: 02/15/21  2:30 PM   Specimen: BLOOD  Result Value Ref Range Status   Specimen Description   Final    BLOOD RIGHT ANTECUBITAL Performed at [facility] [address], [place], [place] [address]    Special Requests   Final    BOTTLES DRAWN AEROBIC AND ANAEROBIC Blood Culture adequate volume Performed at [facility] [address], [place], [place] [address]    Culture  Final    NO GROWTH 2 DAYS Performed at [facility], [place] [address] [place], [place] [address]    Report Status PENDING  Incomplete  Blood culture (routine x 2)     Status: None (Preliminary result)   Collection Time: 02/15/21  2:30 PM   Specimen: BLOOD  Result Value Ref Range Status   Specimen Description   Final    BLOOD LEFT ANTECUBITAL Performed at [facility] [address], [place], [place] [address]    Special Requests   Final    BOTTLES DRAWN AEROBIC AND ANAEROBIC Blood Culture adequate volume Performed at [facility] [address], [place], [place] [address]    Culture   Final    NO GROWTH 2 DAYS Performed at [facility] [address], [place], [place] [address]    Report Status PENDING  Incomplete         Radiology Studies: ECHOCARDIOGRAM COMPLETE  Result Date: 02/16/2021    ECHOCARDIOGRAM REPORT   Patient Name:   [name] [name] Date of Exam: 02/16/2021 Medical Rec #:  [id number]         Height:       63.0 in Accession #:    [id number]        Weight:       199.5 lb Date of Birth:  July 07, 1951          BSA:          1.932 m Patient Age:    63 years          BP:           118/83 mmHg Patient Gender: F                 HR:           61 bpm. Exam Location:  Inpatient Procedure: 2D Echo, Cardiac Doppler and Color Doppler Indications:    I42.9  Cardiomyopathy (unspecified)  History:        Patient has prior history of Echocardiogram examinations, most                 recent 03/18/2019. CHF, COPD and TIA, Signs/Symptoms:Dyspnea;                 Risk Factors:Hypertension, Diabetes, Dyslipidemia and Sleep                 Apnea. CKD. GERD. Hypothroidism.  Sonographer:    Jonelle Sidle Dance Referring Phys: [id number] Yamira Papa CHIRAG Gurtha Picker IMPRESSIONS  1. Left ventricular ejection fraction, by estimation, is 45 to 50%. The left ventricle has mildly decreased function. The left ventricle has no regional wall motion abnormalities. There is moderate left ventricular hypertrophy. Left ventricular diastolic parameters are consistent with Grade I diastolic dysfunction (impaired relaxation).  2. Right ventricular systolic function is normal. The right ventricular size is normal. There is normal pulmonary artery systolic pressure.  3. Left atrial size was severely dilated.  4. The mitral valve is normal in structure. Mild mitral valve regurgitation.  5. The aortic valve is normal in structure. Aortic valve regurgitation is not visualized. FINDINGS  Left Ventricle: Left ventricular ejection fraction, by estimation, is 45 to 50%. The left ventricle has mildly decreased function. The left ventricle has no regional wall motion abnormalities. The left ventricular internal cavity size was normal in size. There is moderate left ventricular hypertrophy. Left ventricular diastolic parameters are consistent with Grade I diastolic dysfunction (impaired relaxation). Right  Ventricle: The right ventricular size is normal. No increase in right ventricular wall thickness. Right ventricular systolic function is normal. There is normal pulmonary artery systolic pressure. The tricuspid regurgitant velocity is 2.21 m/s, and  with an assumed right atrial pressure of 3 mmHg, the estimated right ventricular systolic pressure is 63.0 mmHg. Left Atrium: Left atrial size was severely dilated. Right Atrium:  Right atrial size was normal in size. Pericardium: There is no evidence of pericardial effusion. Mitral Valve: The mitral valve is normal in structure. Mild mitral valve regurgitation. Tricuspid Valve: The tricuspid valve is normal in structure. Tricuspid valve regurgitation is mild . No evidence of tricuspid stenosis. Aortic Valve: The aortic valve is normal in structure. Aortic valve regurgitation is not visualized. Pulmonic Valve: The pulmonic valve was normal in structure. Pulmonic valve regurgitation is not visualized. Aorta: The aortic root and ascending aorta are structurally normal, with no evidence of dilitation. IAS/Shunts: The atrial septum is grossly normal.  LEFT VENTRICLE PLAX 2D LVIDd:         5.10 cm LVIDs:         3.60 cm LV PW:         1.40 cm LV IVS:        1.20 cm LVOT diam:     2.10 cm LV SV:         59 LV SV Index:   30 LVOT Area:     3.46 cm  RIGHT VENTRICLE             IVC RV Basal diam:  2.60 cm     IVC diam: 1.60 cm RV S prime:     13.60 cm/s TAPSE (M-mode): 1.8 cm LEFT ATRIUM             Index       RIGHT ATRIUM           Index LA diam:        4.80 cm 2.48 cm/m  RA Area:     14.30 cm LA Vol (A2C):   91.3 ml 47.27 ml/m RA Volume:   34.60 ml  17.91 ml/m LA Vol (A4C):   79.2 ml 41.00 ml/m LA Biplane Vol: 86.4 ml 44.73 ml/m  AORTIC VALVE LVOT Vmax:   73.37 cm/s LVOT Vmean:  50.933 cm/s LVOT VTI:    0.170 m  AORTA Ao Root diam: 3.20 cm Ao Asc diam:  2.90 cm MITRAL VALVE               TRICUSPID VALVE MV Area (PHT): 2.01 cm    TR Peak grad:   19.5 mmHg MV Decel Time: 377 msec    TR Vmax:        221.00 cm/s MV E velocity: 58.10 cm/s MV A velocity: 98.50 cm/s  SHUNTS MV E/A ratio:  0.59        Systemic VTI:  0.17 m                            Systemic Diam: 2.10 cm Mertie Moores MD Electronically signed by Mertie Moores MD Signature Date/Time: 02/16/2021/2:54:54 PM    Final         Scheduled Meds: . carvedilol  12.5 mg Oral BID WC  . clopidogrel  75 mg Oral Daily  . clotrimazole  1  Applicatorful Vaginal QHS  . heparin  5,000 Units Subcutaneous Q8H  . insulin aspart  0-5 Units Subcutaneous QHS  . insulin aspart  0-9 Units Subcutaneous TID WC  . insulin aspart protamine- aspart  20 Units Subcutaneous BID WC  . levothyroxine  200 mcg Oral Q0600  . pantoprazole  40 mg Oral BID   Continuous Infusions:   LOS: 1 day   Time spent= 35 mins    Penney Domanski Arsenio Loader, MD [facility]  If 7PM-7AM, please contact night-coverage  02/18/2021, 8:58 AM

## 2021-02-18 NOTE — TOC Progression Note (Signed)
Transition of Care [facility]) - Progression Note    Patient Details  Name: [name] [name] MRN: [id number] Date of Birth: 09-Nov-1950  Transition of Care [facility]) CM/SW [place],  Phone Number: 02/18/2021, 1:32 PM  Clinical Narrative:   Patient chooses [facility].  Called Navi-Reference [id number].  May 14-May 17.  Ariste Dagout  FAX [phone]. TOC will continue to follow during the course of hospitalization.     Expected Discharge Plan: [place] Barriers to Discharge: Continued Medical Work up  Expected Discharge Plan and Services Expected Discharge Plan: [place] In-house Referral: Clinical Social Work   Post Acute Care Choice: [place] Living arrangements for the past 2 months: Apartment                 DME Arranged: N/A DME Agency: NA                   Social Determinants of Health (SDOH) Interventions    Readmission Risk Interventions Readmission Risk Prevention Plan 01/14/2020  Transportation Screening Complete  Medication Review Press photographer) Complete  PCP or Specialist appointment within 3-5 days of discharge Complete  HRI or Home Care Consult Complete  SW Recovery Care/Counseling Consult Complete  Palliative Care Screening Not [place] Complete  Some recent data might be hidden

## 2021-02-19 LAB — CBC
HCT: 26.3 % — ABNORMAL LOW (ref 36.0–46.0)
Hemoglobin: 7.7 g/dL — ABNORMAL LOW (ref 12.0–15.0)
MCH: 26 pg (ref 26.0–34.0)
MCHC: 29.3 g/dL — ABNORMAL LOW (ref 30.0–36.0)
MCV: 88.9 fL (ref 80.0–100.0)
Platelets: 307 10*3/uL (ref 150–400)
RBC: 2.96 MIL/uL — ABNORMAL LOW (ref 3.87–5.11)
RDW: 24.9 % — ABNORMAL HIGH (ref 11.5–15.5)
WBC: 11.9 10*3/uL — ABNORMAL HIGH (ref 4.0–10.5)
nRBC: 0 % (ref 0.0–0.2)

## 2021-02-19 LAB — MAGNESIUM: Magnesium: 1.9 mg/dL (ref 1.7–2.4)

## 2021-02-19 LAB — GLUCOSE, CAPILLARY
Glucose-Capillary: 156 mg/dL — ABNORMAL HIGH (ref 70–99)
Glucose-Capillary: 133 mg/dL — ABNORMAL HIGH (ref 70–99)
Glucose-Capillary: 222 mg/dL — ABNORMAL HIGH (ref 70–99)
Glucose-Capillary: 132 mg/dL — ABNORMAL HIGH (ref 70–99)

## 2021-02-19 MED ORDER — DICLOFENAC SODIUM 1 % EX GEL
2.0000 g | Freq: Four times a day (QID) | CUTANEOUS | Status: DC
  Administered 2021-02-19 – 2021-02-20 (×5): 2 g via TOPICAL
  Filled 2021-02-19: qty 100

## 2021-02-19 MED ORDER — SENNA 8.6 MG PO TABS
1.0000 | ORAL_TABLET | Freq: Once | ORAL | Status: AC
  Administered 2021-02-19: 8.6 mg via ORAL

## 2021-02-19 NOTE — Progress Notes (Signed)
Nutrition Brief Note RD working remotely.   Patient identified on the Malnutrition Screening Tool (MST) Report with score of 2.0  Wt Readings from Last 15 Encounters:  02/19/21 103.5 kg  01/24/21 96 kg  01/05/21 96.2 kg  06/30/20 110.2 kg  05/12/20 108.9 kg  04/05/20 108.4 kg  04/01/20 108.4 kg  03/31/20 111.6 kg  03/15/20 110.5 kg  02/03/20 120.2 kg  02/02/20 121.8 kg  01/06/20 113.4 kg  11/06/19 115.2 kg  10/30/19 115.2 kg  10/26/19 115.2 kg    Body mass index is 40.42 kg/m. Patient meets criteria for morbid obesity based on current BMI. Weight today is 228 lb and is up from weight a month and a half ago. Noted to have deep pitting edema to BLE which may, at least in part, account for weight gain. Skin WDL.   Current diet order is Carb Modified and she has been eating mainly 75-100% of meals at this time. Labs and medications reviewed.   No nutrition interventions warranted at this time. If nutrition issues arise, please consult RD.       Jarome Matin, MS, RD, LDN, CNSC Inpatient Clinical Dietitian RD pager # available in [place]  After hours/weekend pager # available in [facility]

## 2021-02-19 NOTE — Progress Notes (Signed)
NT Man[place]ja reports at this time patient son was visiting and she entered the room to obtain patient's blood sugar saying "excuse me sir I need to get your mom's blood sugar".  Patient's son responded "my name aint no F-ING sir.  My daddy named me Ray and that's what you F-ING call me".  NT responded that she was sorry but she did not know his name because they had not met.  Visitor took offense to this, called NT a bitch and left the room.  He was observed walking down the hallway waving his middle finger in the air and shouting explitives.  Visitor got on the elevator and left the unit.

## 2021-02-19 NOTE — Progress Notes (Signed)
This Probation officer entered room to give patient her 1200 insulin.  Nurse tech Germain Osgood was in the room.  Patient stated to NT " I thought I had a good nurse today but I was wrong".  Writer questioned patient, asking patient what I had done to upset her.  She stated "you let me bleed on the floor and didn't come quick enough when I needed you".  Patient informed that at the time she was referring to I had been in another room with another patient and came to help her as soon as I could.   Earlier in the shift the patient's MD had come to find me in another room to let me know this patient had a spot on her leg that was bleeding and needed a bandage. When I was done in the room I was in, I went to this patient, found a spot in her groin she was bleeding from and bandaged it.  I also assisted her with completing her bath and then assisted her to the recliner chair, making sure she could reach her callbell and personal items prior to leaving the room.

## 2021-02-19 NOTE — Progress Notes (Signed)
PROGRESS NOTE    [name] [name]  CHE:[id number] DOB: 04/26/1951 DOA: 02/15/2021 PCP: [name], MD   Brief Narrative:  70 y.o. female with medical history significant of systolic CHF with EF 53%, GERD, erosive gastritis, COPD, hypothyroidism, essential hypertension, iron deficiency anemia, CKD stage V comes to the hospital with complains of nausea and vomiting.  Patient was recently admitted 4 weeks ago for blood loss anemia requiring endoscopy which showed erosive gastritis.  She was discharged on PPI twice daily with outpatient follow-up.  Patient comes in again with complaints of nausea vomiting which was thought to be due to worsening gastric erosion or bowel wall edema.  Lab work revealed severe hypothyroidism.  Her Synthroid was restarted at 200 mcg daily.  PT/OT recommended SNF.  Assessment & Plan:   Principal Problem:   Intractable nausea and vomiting Active Problems:   PAD (peripheral artery disease) (HCC)   DM (diabetes mellitus) (HCC)   CKD (chronic kidney disease), stage IV (HCC)   Morbid obesity (HCC)   HTN (hypertension)   Chronic CHF (congestive heart failure) (HCC)   Anemia, unspecified   Increased weakness when ambulating  Severe hypothyroidism -  TSH-10, T4 0.5.  Noncompliant with Synthroid -  continue Synthroid 200 mcg daily - Cortisol random & am = Normal.   Nausea and vomiting, intractable- resolved - Unclear etiology.  Lipase negative, has history of cholecystectomy.  CT abdomen pelvis negative.  This could be exacerbation of her erosive gastritis versus bowel edema from fluid overload.   - Antiemetics as needed.  Supportive care. - Euvolemic  Erosive gastritis - Had endoscopy about 4 weeks ago showing erosive gastritis.  We will place her on PPI twice daily, Carafate with meals.  Monitor her symptoms.  Follows outpatient [facility] -  diet as tolerated  Congestive heart failure with reduced ejection fraction, 40%.  Class II - Patient has some signs  of volume overload with bilateral lower extremity pitting edema.  Could also have bowel edema causing her nausea and vomiting. -  Echo 02/2021-EF 50%, grade 1 DD - Hold diuretics.  Appears overall euvolemic  Lactic acidosis-improving.  Unable to give more fluid due to signs of volume overload  SIRS -  Resolved.  CKD stage V, stable - Baseline creatinine 3.0.    History of COPD with tobacco use - As needed bronchodilators  History of CAD - Currently chest pain-free.  Continue Plavix and Coreg  Diabetes mellitus type 2, insulin-dependent - Currently Trulicity on hold.   7030 to 20 units twice daily - Accu-Cheks and sliding scale  Very weak appearing. Pt/ot recommended SNF.   DVT prophylaxis: Subcu heparin Code Status: Full code Family Communication:      Dispo: The patient is from: Home              Anticipated d/c is to: SNF             Currently patient is doing medically better awaiting SNF placement   Difficult to place patient No  Subjective: Feels okay no complaints. She was having some bleeding from right thigh area from a boil during the visit.  Review of Systems Otherwise negative except as per HPI, including: General: Denies fever, chills, night sweats or unintended weight loss. Resp: Denies cough, wheezing, shortness of breath. Cardiac: Denies chest pain, palpitations, orthopnea, paroxysmal nocturnal dyspnea. GI: Denies abdominal pain, nausea, vomiting, diarrhea or constipation GU: Denies dysuria, frequency, hesitancy or incontinence MS: Denies muscle aches, joint pain or swelling Neuro:  Denies headache, neurologic deficits (focal weakness, numbness, tingling), abnormal gait Psych: Denies anxiety, depression, SI/HI/AVH Skin: Denies new rashes or lesions ID: Denies sick contacts, exotic exposures, travel Examination: Constitutional: Not in acute distress Respiratory: Clear to auscultation bilaterally Cardiovascular: Normal sinus rhythm, no  rubs Abdomen: Nontender nondistended good bowel sounds Musculoskeletal: No edema noted Skin: No rashes seen Neurologic: CN 2-12 grossly intact.  And nonfocal Psychiatric: Normal judgment and insight. Alert and oriented x 3. Normal mood. Objective: Vitals:   02/18/21 1140 02/18/21 1342 02/18/21 2129 02/19/21 0554  BP:  (!) 154/71 (!) 152/56 (!) 179/93  Pulse:  70 70 85  Resp:  18 20 20   Temp:  (!) 97.5 F (36.4 C) 98.2 F (36.8 C) 98.7 F (37.1 C)  TempSrc:  Oral Oral   SpO2: 96% 100% 100% 100%  Weight:    103.5 kg  Height:        Intake/Output Summary (Last 24 hours) at 02/19/2021 0830 Last data filed at 02/18/2021 1213 Gross per 24 hour  Intake 829 ml  Output --  Net 829 ml   Filed Weights   02/16/21 0500 02/18/21 0427 02/19/21 0554  Weight: 90.5 kg 102.1 kg 103.5 kg     Data Reviewed:   CBC: Recent Labs  Lab 02/15/21 1247 02/15/21 2023 02/16/21 0500 02/17/21 0550 02/18/21 0552 02/19/21 0605  WBC 18.1* 15.1* 14.4* 13.7* 11.2* 11.9*  NEUTROABS 16.3*  --   --   --   --   --   HGB 8.2* 7.8* 7.8* 8.2* 7.0* 7.7*  HCT 28.3* 26.5* 26.1* 27.4* 23.9* 26.3*  MCV 87.1 88.6 85.3 86.2 88.5 88.9  PLT 294 273 281 217 273 119   Basic Metabolic Panel: Recent Labs  Lab 02/15/21 1247 02/15/21 2023 02/16/21 0500 02/17/21 0550 02/18/21 0552 02/19/21 0605  NA 140  --  138 138 137  --   K 3.8  --  3.8 4.2 3.5  --   CL 96*  --  98 100 100  --   CO2 28  --  30 30 26   --   GLUCOSE 223*  --  191* 172* 175*  --   BUN 144*  --  139* 128* 125*  --   CREATININE 2.57* 2.44* 2.71* 2.79* 2.59*  --   CALCIUM 9.9  --  9.5 9.5 9.5  --   MG  --   --  2.0 1.7 1.8 1.9   GFR: Estimated Creatinine Clearance: 23.2 mL/min (A) (by C-G formula based on SCr of 2.59 mg/dL (H)). Liver Function Tests: Recent Labs  Lab 02/15/21 1247 02/16/21 0500 02/17/21 0550 02/18/21 0552  AST 26 26 23 22   ALT 26 22 18 16   ALKPHOS 66 54 66 69  BILITOT 0.8 0.5 0.4 0.5  PROT 6.4* 6.1* 6.0* 5.7*   ALBUMIN 3.3* 3.0* 2.8* 2.7*   Recent Labs  Lab 02/15/21 1247  LIPASE 59*   No results for input(s): AMMONIA in the last 168 hours. Coagulation Profile: No results for input(s): INR, PROTIME in the last 168 hours. Cardiac Enzymes: No results for input(s): CKTOTAL, CKMB, CKMBINDEX, TROPONINI in the last 168 hours. BNP (last 3 results) No results for input(s): PROBNP in the last 8760 hours. HbA1C: No results for input(s): HGBA1C in the last 72 hours. CBG: Recent Labs  Lab 02/18/21 0812 02/18/21 1209 02/18/21 1755 02/18/21 2126 02/19/21 0735  GLUCAP 149* 144* 171* 162* 156*   Lipid Profile: No results for input(s): CHOL, HDL, LDLCALC, TRIG, CHOLHDL, LDLDIRECT in  the last 72 hours. Thyroid Function Tests: No results for input(s): TSH, T4TOTAL, FREET4, T3FREE, THYROIDAB in the last 72 hours. Anemia Panel: No results for input(s): VITAMINB12, FOLATE, FERRITIN, TIBC, IRON, RETICCTPCT in the last 72 hours. Sepsis Labs: Recent Labs  Lab 02/15/21 1430 02/15/21 1619 02/15/21 2023 02/16/21 0500 02/17/21 0746  PROCALCITON  --   --  0.13 <0.10 0.12  LATICACIDVEN 2.3* 2.1*  --   --   --     Recent Results (from the past 240 hour(s))  Resp Panel by RT-PCR (Flu A&B, Covid) Nasopharyngeal Swab     Status: None   Collection Time: 02/15/21  1:19 PM   Specimen: Nasopharyngeal Swab; Nasopharyngeal(NP) swabs in vial transport medium  Result Value Ref Range Status   SARS Coronavirus 2 by RT PCR NEGATIVE NEGATIVE Final    Comment: (NOTE) SARS-CoV-2 target nucleic acids are NOT DETECTED.  The SARS-CoV-2 RNA is generally detectable in upper respiratory specimens during the acute phase of infection. The lowest concentration of SARS-CoV-2 viral copies this assay can detect is 138 copies/mL. A negative result does not preclude SARS-Cov-2 infection and should not be used as the sole basis for treatment or other patient management decisions. A negative result may occur with  improper  specimen collection/handling, submission of specimen other than nasopharyngeal swab, presence of viral mutation(s) within the areas targeted by this assay, and inadequate number of viral copies(<138 copies/mL). A negative result must be combined with clinical observations, patient history, and epidemiological information. The expected result is Negative.  Fact Sheet for Patients:  EntrepreneurPulse.com.au  Fact Sheet for Healthcare Providers:  IncredibleEmployment.be  This test is no t yet approved or cleared by the [place] FDA and  has been authorized for detection and/or diagnosis of SARS-CoV-2 by FDA under an Emergency Use Authorization (EUA). This EUA will remain  in effect (meaning this test can be used) for the duration of the COVID-19 declaration under Section 564(b)(1) of the Act, 21 U.S.C.section 360bbb-3(b)(1), unless the authorization is terminated  or revoked sooner.       Influenza A by PCR NEGATIVE NEGATIVE Final   Influenza B by PCR NEGATIVE NEGATIVE Final    Comment: (NOTE) The Xpert Xpress SARS-CoV-2/FLU/RSV plus assay is intended as an aid in the diagnosis of influenza from Nasopharyngeal swab specimens and should not be used as a sole basis for treatment. Nasal washings and aspirates are unacceptable for Xpert Xpress SARS-CoV-2/FLU/RSV testing.  Fact Sheet for Patients: EntrepreneurPulse.com.au  Fact Sheet for Healthcare Providers: IncredibleEmployment.be  This test is not yet approved or cleared by the [place] FDA and has been authorized for detection and/or diagnosis of SARS-CoV-2 by FDA under an Emergency Use Authorization (EUA). This EUA will remain in effect (meaning this test can be used) for the duration of the COVID-19 declaration under Section 564(b)(1) of the Act, 21 U.S.C. section 360bbb-3(b)(1), unless the authorization is terminated or revoked.  Performed at  [facility], [place] [address], [place], [place] [address]   Blood culture (routine x 2)     Status: None (Preliminary result)   Collection Time: 02/15/21  2:30 PM   Specimen: BLOOD  Result Value Ref Range Status   Specimen Description   Final    BLOOD RIGHT ANTECUBITAL Performed at [place] [address], [place], [place] [address]    Special Requests   Final    BOTTLES DRAWN AEROBIC AND ANAEROBIC Blood Culture adequate volume Performed at [facility], [address] [place], [place] [address]    Culture   Final    NO GROWTH 3 DAYS Performed at [facility], [place] [address], [place], [place] [address]    Report Status PENDING  Incomplete  Blood culture (routine x 2)     Status: None (Preliminary result)   Collection Time: 02/15/21  2:30 PM   Specimen: BLOOD  Result Value Ref Range Status   Specimen Description   Final    BLOOD LEFT ANTECUBITAL Performed at [place] [address], [place], [place] [address]    Special Requests   Final    BOTTLES DRAWN AEROBIC AND ANAEROBIC Blood Culture adequate volume Performed at [place] [address], [place], [place] [address]    Culture   Final    NO GROWTH 3 DAYS Performed at [facility], [place] [address], [place], [place] [address]    Report Status PENDING  Incomplete         Radiology Studies: No results found.      Scheduled Meds: . carvedilol  12.5 mg Oral BID WC  . clopidogrel  75 mg Oral Daily  . clotrimazole  1 Applicatorful Vaginal QHS  . heparin  5,000 Units Subcutaneous Q8H  . insulin aspart  0-5 Units Subcutaneous QHS  . insulin aspart  0-9 Units Subcutaneous TID WC  . insulin aspart protamine- aspart  20 Units Subcutaneous BID WC  . levothyroxine  200 mcg Oral Q0600  . pantoprazole  40 mg Oral BID   Continuous Infusions:   LOS: 2 days   Time spent= 35 mins    Sharron Petruska Arsenio Loader, MD [facility]  If 7PM-7AM, please contact night-coverage  02/19/2021, 8:30 AM

## 2021-02-20 LAB — CULTURE, BLOOD (ROUTINE X 2)
Culture: NO GROWTH
Culture: NO GROWTH
Special Requests: ADEQUATE
Special Requests: ADEQUATE

## 2021-02-20 LAB — CBC
HCT: 24 % — ABNORMAL LOW (ref 36.0–46.0)
Hemoglobin: 7.1 g/dL — ABNORMAL LOW (ref 12.0–15.0)
MCH: 26.2 pg (ref 26.0–34.0)
MCHC: 29.6 g/dL — ABNORMAL LOW (ref 30.0–36.0)
MCV: 88.6 fL (ref 80.0–100.0)
Platelets: 267 10*3/uL (ref 150–400)
RBC: 2.71 MIL/uL — ABNORMAL LOW (ref 3.87–5.11)
RDW: 24.5 % — ABNORMAL HIGH (ref 11.5–15.5)
WBC: 9.4 10*3/uL (ref 4.0–10.5)
nRBC: 0 % (ref 0.0–0.2)

## 2021-02-20 LAB — BASIC METABOLIC PANEL
Anion gap: 11 (ref 5–15)
BUN: 118 mg/dL — ABNORMAL HIGH (ref 8–23)
CO2: 24 mmol/L (ref 22–32)
Calcium: 9.8 mg/dL (ref 8.9–10.3)
Chloride: 103 mmol/L (ref 98–111)
Creatinine, Ser: 2.22 mg/dL — ABNORMAL HIGH (ref 0.44–1.00)
GFR, Estimated: 23 mL/min — ABNORMAL LOW (ref >60)
Glucose, Bld: 91 mg/dL (ref 70–99)
Potassium: 4.1 mmol/L (ref 3.5–5.1)
Sodium: 138 mmol/L (ref 135–145)

## 2021-02-20 LAB — RESP PANEL BY RT-PCR (FLU A&B, COVID) ARPGX2
Influenza A by PCR: NEGATIVE
Influenza B by PCR: NEGATIVE
SARS Coronavirus 2 by RT PCR: POSITIVE — AB

## 2021-02-20 LAB — GLUCOSE, CAPILLARY
Glucose-Capillary: 106 mg/dL — ABNORMAL HIGH (ref 70–99)
Glucose-Capillary: 225 mg/dL — ABNORMAL HIGH (ref 70–99)
Glucose-Capillary: 169 mg/dL — ABNORMAL HIGH (ref 70–99)
Glucose-Capillary: 239 mg/dL — ABNORMAL HIGH (ref 70–99)

## 2021-02-20 LAB — MAGNESIUM: Magnesium: 1.9 mg/dL (ref 1.7–2.4)

## 2021-02-20 MED ORDER — LEVOTHYROXINE SODIUM 200 MCG PO TABS: 200.0000 ug | ORAL_TABLET | Freq: Every day | ORAL | 0 refills | Status: DC

## 2021-02-20 MED ORDER — OXYCODONE-ACETAMINOPHEN 5-325 MG PO TABS: 1.0000 | ORAL_TABLET | Freq: Four times a day (QID) | ORAL | 0 refills | Status: DC | PRN

## 2021-02-20 NOTE — TOC Progression Note (Addendum)
Transition of Care [name]) - Progression Note    Patient Details  Name: [name] [name] MRN: [id number] Date of Birth: 08/29/1951  Transition of Care [facility] CM/SW Contact  Ross Ludwig, [place] Phone Number: 02/20/2021, 10:02 AM  Clinical Narrative:    CSW attempted to contact [facility] to see if bed is available, CSW had to leave a message on voice mail.  CSW updated SNF that per MD patient is medically ready for discharge, insurance Josem Kaufmann has been received.  CSW received phone call that patient can be accepted once Covid test is resulted.  CSW updated attending physician and bedside nurse.  1:45pm  CSW was informed patient tested positive for Covid-19.  CSW updated SNF admissions worker, she will notify administrator to determine if they can accept patient or not.  CSW to continue to follow patient's progress throughout discharge planning.   Expected Discharge Plan: [place] Barriers to Discharge: Continued Medical Work up  Expected Discharge Plan and Services Expected Discharge Plan: [place] In-house Referral: Clinical Social Work   Post Acute Care Choice: [place] Living arrangements for the past 2 months: Apartment Expected Discharge Date: 02/20/21               DME Arranged: N/A DME Agency: NA                   Social Determinants of Health (SDOH) Interventions    Readmission Risk Interventions Readmission Risk Prevention Plan 01/14/2020  Transportation Screening Complete  Medication Review Press photographer) Complete  PCP or Specialist appointment within 3-5 days of discharge Complete  HRI or Home Care Consult Complete  SW Recovery Care/Counseling Consult Complete  Palliative Care Screening Not [place] Complete  Some recent data might be hidden

## 2021-02-20 NOTE — Discharge Summary (Addendum)
Physician Discharge Summary  [name] [name] CHE:[id number] DOB: [birth year] DOA: 02/15/2021  PCP: [name], MD  Admit date: 02/15/2021 Discharge date: 02/20/2021  Admitted From:Home Disposition:  SNF  Recommendations for Outpatient Follow-up:  1. Follow up with PCP in 1-2 weeks 2. Please obtain BMP/CBC in 3-5 days 3. Synthroid 226mcg daily. Check TSH and T4 in 2 weeks  4. Pain medication prescribed, bowel regimen prn 5. Adjust diuretic regimen as appropriate.  6. COVID 19 positive test on 02/20/21. Asymptomatic. Maintain local precautions and supportive care.    Discharge Condition: Stable CODE STATUS: Full  Diet recommendation: Diabetic  Brief/Interim Summary: 70 y.o.femalewith medical history significant ofsystolic CHF with EF 53%, GERD, erosive gastritis, COPD, hypothyroidism, essential hypertension, iron deficiency anemia, CKD stage V comes to the hospital with complains of nausea and vomiting. Patient was recently admitted 4 weeks ago for blood loss anemia requiring endoscopy which showed erosive gastritis. She was discharged on PPI twice daily with outpatient follow-up.  Patient comes in again with complaints of nausea vomiting which was thought to be due to worsening gastric erosion or bowel wall edema.  Lab work revealed severe hypothyroidism.  Her Synthroid was restarted at 200 mcg daily.  PT/OT recommended SNF. Arrangements for SNF was made.  Patient feeling much better on the day of discharge and looking forward to getting her physical therapy started. Upon discharge she had incidental finding COVID 19 +.   Assessment & Plan:   Principal Problem:   Intractable nausea and vomiting Active Problems:   PAD (peripheral artery disease) (HCC)   DM (diabetes mellitus) (HCC)   CKD (chronic kidney disease), stage IV (HCC)   Morbid obesity (HCC)   HTN (hypertension)   Chronic CHF (congestive heart failure) (HCC)   Anemia, unspecified   Increased weakness when  ambulating  Severe hypothyroidism - TSH-10, T4 0.5.  Noncompliant with Synthroid.  Recheck TSH/T4 in 2 weeks - continue Synthroid 200 mcg daily - Cortisol random & am = Normal.   Nausea and vomiting, intractable- resolved -Unclear etiology. Lipase negative, has history of cholecystectomy. CT abdomen pelvis negative. This could be exacerbation of her erosive gastritis versus bowel edema from fluid overload.  -Antiemetics as needed. Supportive care. - Euvolemic  Erosive gastritis -Had endoscopy about 4 weeks ago showing erosive gastritis. We will place her on PPI twice daily.[name] outpatient [facility] - diet as tolerated  COVID 19 positive 02/20/21 - incidental Finding. Asymptomatic.   Congestive heart failure with reduced ejection fraction, 40%. Class II -Patient has some signs of volume overload with bilateral lower extremity pitting edema. Could also have bowel edema causing her nausea and vomiting. - Echo 02/2021-EF 50%, grade 1 DD -  Resume her home diuretics.  Follow-up outpatient with her cardiologist.  Lactic acidosis- resolved  SIRS - Resolved.  CKD stage V, stable -Baseline creatinine 3.0. Discharge creatinine 2.2  History of COPD with tobacco use -As needed bronchodilators  History of CAD -Currently chest pain-free. Continue Plavix and Coreg  Diabetes mellitus type 2, insulin-dependent - Resume home diabetic regimen  Very weak appearing. Pt/ot recommended SNF.   Body mass index is 41.43 kg/m.         Discharge Diagnoses:  Principal Problem:   Intractable nausea and vomiting Active Problems:   PAD (peripheral artery disease) (HCC)   DM (diabetes mellitus) (HCC)   CKD (chronic kidney disease), stage IV (HCC)   Morbid obesity ([place])   HTN (hypertension)   Chronic CHF (congestive heart failure) ([place])  Anemia, unspecified   Increased weakness when ambulating      Consultations:  None  Subjective: Feels  great, looking forward to starting her rehab.  Discharge Exam: Vitals:   02/19/21 2115 02/20/21 0427  BP: 123/63 (!) 160/95  Pulse: 81 79  Resp: 20 20  Temp: 98.7 F (37.1 C) 98.5 F (36.9 C)  SpO2: 100% 100%   Vitals:   02/19/21 0554 02/19/21 1341 02/19/21 2115 02/20/21 0427  BP: (!) 179/93 134/71 123/63 (!) 160/95  Pulse: 85 76 81 79  Resp: 20 14 20 20   Temp: 98.7 F (37.1 C) 97.8 F (36.6 C) 98.7 F (37.1 C) 98.5 F (36.9 C)  TempSrc:      SpO2: 100% 100% 100% 100%  Weight: 103.5 kg   106.1 kg  Height:        General: Pt is alert, awake, not in acute distress Cardiovascular: RRR, S1/S2 +, no rubs, no gallops Respiratory: CTA bilaterally, no wheezing, no rhonchi Abdominal: Soft, NT, ND, bowel sounds + Extremities: no edema, no cyanosis  Discharge Instructions   Allergies as of 02/20/2021      Reactions   Nebivolol Swelling   Chest pain (reaction to Bystolic)   Ace Inhibitors Swelling, Other (See Comments)   Tongue swell   Ace Inhibitors Swelling   Morphine And Related Itching   Morphine And Related Itching      Medication List    STOP taking these medications   oxyCODONE-acetaminophen 10-325 MG tablet Commonly known as: PERCOCET Replaced by: oxyCODONE-acetaminophen 5-325 MG tablet     TAKE these medications   allopurinol 100 MG tablet Commonly known as: ZYLOPRIM Take 100 mg by mouth daily.   carvedilol 12.5 MG tablet Commonly known as: COREG Take 12.5 mg by mouth 2 (two) times daily.   cholecalciferol 25 MCG (1000 UNIT) tablet Commonly known as: VITAMIN D3 Take 1,000 Units by mouth daily.   cinacalcet 30 MG tablet Commonly known as: SENSIPAR Take 30 mg by mouth daily.   clopidogrel 75 MG tablet Commonly known as: PLAVIX TAKE 1 TABLET BY MOUTH EVERY DAY   diclofenac Sodium 1 % Gel Commonly known as: VOLTAREN Apply 1 application topically 4 (four) times daily as needed (pain).   hydrOXYzine 25 MG tablet Commonly known as:  ATARAX/VISTARIL Take 25 mg by mouth 2 (two) times daily as needed for anxiety.   insulin lispro protamine-lispro (75-25) 100 UNIT/ML Susp injection Commonly known as: HUMALOG 75/25 MIX Inject 30-40 Units into the skin See admin instructions. Inject 40 units into the skin morning and 30 units in the evening   levothyroxine 200 MCG tablet Commonly known as: SYNTHROID Take 1 tablet (200 mcg total) by mouth daily at 6 (six) AM. Start taking on: Feb 21, 2021   magnesium oxide 400 (241.3 Mg) MG tablet Commonly known as: MAG-OX Take 1 tablet (400 mg total) by mouth 2 (two) times daily. What changed: when to take this   metoCLOPramide 5 MG tablet Commonly known as: REGLAN Take 5 mg by mouth 3 (three) times daily.   metolazone 5 MG tablet Commonly known as: ZAROXOLYN Take 5 mg by mouth 3 (three) times a week. Takes 5mg  on Monday, Wednesday and Friday   multivitamin with minerals Tabs tablet Take 1 tablet by mouth daily.   oxyCODONE-acetaminophen 5-325 MG tablet Commonly known as: PERCOCET/ROXICET Take 1 tablet by mouth every 6 (six) hours as needed for moderate pain or severe pain. Replaces: oxyCODONE-acetaminophen 10-325 MG tablet   pantoprazole 40 MG tablet  Commonly known as: PROTONIX Take 1 tablet (40 mg total) by mouth 2 (two) times daily before a meal for 60 days, THEN 1 tablet (40 mg total) daily. Start taking on: January 31, 2021   polyvinyl alcohol 1.4 % ophthalmic solution Commonly known as: LIQUIFILM TEARS Place 2 drops into both eyes daily as needed for dry eyes.   Potassium Chloride ER 20 MEQ Tbcr Take 1 tablet by mouth 3 (three) times a week. Tuesday, Thursdays and Saturdays   torsemide 100 MG tablet Commonly known as: DEMADEX Take 100 mg by mouth 2 (two) times daily.   Trulicity 1.5 XH/3.7JI Sopn Generic drug: Dulaglutide Inject 1.5 mg into the skin once a week. Monday   VITAMIN B COMPLEX PO Take 1 capsule by mouth daily.       Contact information for  follow-up providers    [name], MD. Schedule an appointment as soon as possible for a visit in 1 week(s).   Specialty: Internal Medicine Contact information: [place] [address] [phone]        Larey Dresser, MD .   Specialty: Cardiology Contact information: [place] [address] [phone]            Contact information for after-discharge care    Destination    [facility] Preferred SNF .   Service: Skilled Nursing Contact information: [address] [place] [address] [phone]                 Allergies  Allergen Reactions  . Nebivolol Swelling    Chest pain (reaction to Bystolic)  . Ace Inhibitors Swelling and Other (See Comments)    Tongue swell  . Ace Inhibitors Swelling  . Morphine And Related Itching  . Morphine And Related Itching    You were cared for by a hospitalist during your hospital stay. If you have any questions about your discharge medications or the care you received while you were in the hospital after you are discharged, you can call the unit and asked to speak with the hospitalist on call if the hospitalist that took care of you is not available. Once you are discharged, your primary care physician will handle any further medical issues. Please note that no refills for any discharge medications will be authorized once you are discharged, as it is imperative that you return to your primary care physician (or establish a relationship with a primary care physician if you do not have one) for your aftercare needs so that they can reassess your need for medications and monitor your lab values.   Procedures/Studies: CT ABDOMEN PELVIS WO CONTRAST  Result Date: 02/15/2021 CLINICAL DATA:  Nausea/vomiting, generalized abdominal pain EXAM: CT ABDOMEN AND PELVIS WITHOUT CONTRAST TECHNIQUE: Multidetector CT imaging of the abdomen and pelvis was performed  following the standard protocol without IV contrast. COMPARISON:  CT 01/06/2020, chest CT 08/10/2019 FINDINGS: Lower chest: There are 2 left lower lobe pulmonary nodules, 1 of which measures 12 mm and is stable since and November 2020, the other measures 8 mm and is new since recent CT on 01/06/2020 (series 6, image 1 see arrow). This is partially visualized on the first slice on the scan. Hepatobiliary: Unchanged mild hepatomegaly and mildly nodular contour. Prior cholecystectomy. Pancreas: Unremarkable. Spleen: Unremarkable. Adrenals/Urinary Tract: Unchanged bilateral low-density nodular adrenal thickening. Multiple bilateral renal cysts which are unchanged from recent prior exam. Nonspecific bilateral perinephric stranding. No hydronephrosis, nephrolithiasis, or urolithiasis. Stomach/Bowel: Prior  left hemicolectomy. No evidence of bowel obstruction. Normal appendix. Scattered colonic diverticuli. Vascular/Lymphatic: Aorto bi-iliac atherosclerotic calcifications. No AAA. Reproductive: Prior hysterectomy. Other: No abdominopelvic ascites. Musculoskeletal: No suspicious lytic or blastic lesions. Multilevel degenerative changes of the spine. Prior posterior and interbody fusion at L4-L5 partially visualized right total hip arthroplasty. IMPRESSION: No acute findings to explain the patient's pain. No evidence of bowel obstruction. Normal appendix. Two lef lower lobe pulmonary nodules, one of which measures 12 mm and stable since November 2020, the other measures 8 mm and is NEW since 01/06/2020. Recommend non-emergent noncontrast CT of the chest as an outpatient to further evaluate as this is partially visualized. Electronically Signed   By: Maurine Simmering   On: 02/15/2021 15:47   DG Chest Portable 1 View  Result Date: 02/15/2021 CLINICAL DATA:  Status post vomiting x2 today. EXAM: PORTABLE CHEST 1 VIEW COMPARISON:  Single-view of the chest 01/05/2021 and 01/06/2020. FINDINGS: There is cardiomegaly without edema.  Lungs clear. No pneumothorax or pleural fluid. No acute or focal bony abnormality. IMPRESSION: No acute disease. Cardiomegaly. Electronically Signed   By: Inge Rise M.D.   On: 02/15/2021 14:06   ECHOCARDIOGRAM COMPLETE  Result Date: 02/16/2021    ECHOCARDIOGRAM REPORT   Patient Name:   HEPHZIBAH STREHLE Date of Exam: 02/16/2021 Medical Rec #:  [id number]         Height:       63.0 in Accession #:    [id number]        Weight:       199.5 lb Date of Birth:  1951-03-19          BSA:          1.932 m Patient Age:    13 years          BP:           118/83 mmHg Patient Gender: F                 HR:           61 bpm. Exam Location:  Inpatient Procedure: 2D Echo, Cardiac Doppler and Color Doppler Indications:    I42.9 Cardiomyopathy (unspecified)  History:        Patient has prior history of Echocardiogram examinations, most                 recent 03/18/2019. CHF, COPD and TIA, Signs/Symptoms:Dyspnea;                 Risk Factors:Hypertension, Diabetes, Dyslipidemia and Sleep                 Apnea. CKD. GERD. Hypothroidism.  Sonographer:    Jonelle Sidle Dance Referring Phys: [id number] Tashina Credit CHIRAG Twylia Oka IMPRESSIONS  1. Left ventricular ejection fraction, by estimation, is 45 to 50%. The left ventricle has mildly decreased function. The left ventricle has no regional wall motion abnormalities. There is moderate left ventricular hypertrophy. Left ventricular diastolic parameters are consistent with Grade I diastolic dysfunction (impaired relaxation).  2. Right ventricular systolic function is normal. The right ventricular size is normal. There is normal pulmonary artery systolic pressure.  3. Left atrial size was severely dilated.  4. The mitral valve is normal in structure. Mild mitral valve regurgitation.  5. The aortic valve is normal in structure. Aortic valve regurgitation is not visualized. FINDINGS  Left Ventricle: Left ventricular ejection fraction, by estimation, is 45 to 50%. The left ventricle has mildly decreased  function. The left ventricle has  no regional wall motion abnormalities. The left ventricular internal cavity size was normal in size. There is moderate left ventricular hypertrophy. Left ventricular diastolic parameters are consistent with Grade I diastolic dysfunction (impaired relaxation). Right Ventricle: The right ventricular size is normal. No increase in right ventricular wall thickness. Right ventricular systolic function is normal. There is normal pulmonary artery systolic pressure. The tricuspid regurgitant velocity is 2.21 m/s, and  with an assumed right atrial pressure of 3 mmHg, the estimated right ventricular systolic pressure is 84.1 mmHg. Left Atrium: Left atrial size was severely dilated. Right Atrium: Right atrial size was normal in size. Pericardium: There is no evidence of pericardial effusion. Mitral Valve: The mitral valve is normal in structure. Mild mitral valve regurgitation. Tricuspid Valve: The tricuspid valve is normal in structure. Tricuspid valve regurgitation is mild . No evidence of tricuspid stenosis. Aortic Valve: The aortic valve is normal in structure. Aortic valve regurgitation is not visualized. Pulmonic Valve: The pulmonic valve was normal in structure. Pulmonic valve regurgitation is not visualized. Aorta: The aortic root and ascending aorta are structurally normal, with no evidence of dilitation. IAS/Shunts: The atrial septum is grossly normal.  LEFT VENTRICLE PLAX 2D LVIDd:         5.10 cm LVIDs:         3.60 cm LV PW:         1.40 cm LV IVS:        1.20 cm LVOT diam:     2.10 cm LV SV:         59 LV SV Index:   30 LVOT Area:     3.46 cm  RIGHT VENTRICLE             IVC RV Basal diam:  2.60 cm     IVC diam: 1.60 cm RV S prime:     13.60 cm/s TAPSE (M-mode): 1.8 cm LEFT ATRIUM             Index       RIGHT ATRIUM           Index LA diam:        4.80 cm 2.48 cm/m  RA Area:     14.30 cm LA Vol (A2C):   91.3 ml 47.27 ml/m RA Volume:   34.60 ml  17.91 ml/m LA Vol (A4C):    79.2 ml 41.00 ml/m LA Biplane Vol: 86.4 ml 44.73 ml/m  AORTIC VALVE LVOT Vmax:   73.37 cm/s LVOT Vmean:  50.933 cm/s LVOT VTI:    0.170 m  AORTA Ao Root diam: 3.20 cm Ao Asc diam:  2.90 cm MITRAL VALVE               TRICUSPID VALVE MV Area (PHT): 2.01 cm    TR Peak grad:   19.5 mmHg MV Decel Time: 377 msec    TR Vmax:        221.00 cm/s MV E velocity: 58.10 cm/s MV A velocity: 98.50 cm/s  SHUNTS MV E/A ratio:  0.59        Systemic VTI:  0.17 m                            Systemic Diam: 2.10 cm Mertie Moores MD Electronically signed by Mertie Moores MD Signature Date/Time: 02/16/2021/2:54:54 PM    Final    VAS [name] LOWER EXTREMITY VENOUS (DVT)  Result Date: 01/31/2021  Lower Venous DVT Study Patient Name:  Sarayu P Roberge  Date of Exam:   01/31/2021 Medical Rec #: [id number]          Accession #:    [id number] Date of Birth: Jan 30, 1951           Patient Gender: F Patient Age:   70Y Exam Location[facility]  [facility] Procedure:      VAS [name] LOWER EXTREMITY VENOUS (DVT) Referring Phys: [id number] JARED E SEGAL --------------------------------------------------------------------------------  Indications: Edema.  Risk Factors: None identified. Limitations: Poor ultrasound/tissue interface. Comparison Study: No prior studies. Performing Technologist: Oliver Hum RVT  Examination Guidelines: A complete evaluation includes B-mode imaging, spectral Doppler, color Doppler, and power Doppler as needed of all accessible portions of each vessel. Bilateral testing is considered an integral part of a complete examination. Limited examinations for reoccurring indications may be performed as noted. The reflux portion of the exam is performed with the patient in reverse Trendelenburg.  +---------+---------------+---------+-----------+----------+--------------+ RIGHT    CompressibilityPhasicitySpontaneityPropertiesThrombus Aging +---------+---------------+---------+-----------+----------+--------------+ CFV      Full            Yes      Yes                                 +---------+---------------+---------+-----------+----------+--------------+ SFJ      Full                                                        +---------+---------------+---------+-----------+----------+--------------+ FV Prox  Full                                                        +---------+---------------+---------+-----------+----------+--------------+ FV Mid   Full                                                        +---------+---------------+---------+-----------+----------+--------------+ FV DistalFull                                                        +---------+---------------+---------+-----------+----------+--------------+ PFV      Full                                                        +---------+---------------+---------+-----------+----------+--------------+ POP      Full           Yes      Yes                                 +---------+---------------+---------+-----------+----------+--------------+ PTV      Full                                                        +---------+---------------+---------+-----------+----------+--------------+  PERO     Full                                                        +---------+---------------+---------+-----------+----------+--------------+   +---------+---------------+---------+-----------+----------+--------------+ LEFT     CompressibilityPhasicitySpontaneityPropertiesThrombus Aging +---------+---------------+---------+-----------+----------+--------------+ CFV      Full           Yes      Yes                                 +---------+---------------+---------+-----------+----------+--------------+ SFJ      Full                                                        +---------+---------------+---------+-----------+----------+--------------+ FV Prox  Full                                                         +---------+---------------+---------+-----------+----------+--------------+ FV Mid   Full                                                        +---------+---------------+---------+-----------+----------+--------------+ FV DistalFull                                                        +---------+---------------+---------+-----------+----------+--------------+ PFV      Full                                                        +---------+---------------+---------+-----------+----------+--------------+ POP      Full           Yes      Yes                                 +---------+---------------+---------+-----------+----------+--------------+ PTV      Full                                                        +---------+---------------+---------+-----------+----------+--------------+ PERO     Full                                                        +---------+---------------+---------+-----------+----------+--------------+  Summary: RIGHT: - There is no evidence of deep vein thrombosis in the lower extremity.  - No cystic structure found in the popliteal fossa.  LEFT: - There is no evidence of deep vein thrombosis in the lower extremity.  - No cystic structure found in the popliteal fossa.  *See table(s) above for measurements and observations. Electronically signed by Deitra Mayo MD on 01/31/2021 at 41:58:43 PM.    Final       The results of significant diagnostics from this hospitalization (including imaging, microbiology, ancillary and laboratory) are listed below for reference.     Microbiology: Recent Results (from the past 240 hour(s))  Resp Panel by RT-PCR (Flu A&B, Covid) Nasopharyngeal Swab     Status: None   Collection Time: 02/15/21  1:19 PM   Specimen: Nasopharyngeal Swab; Nasopharyngeal(NP) swabs in vial transport medium  Result Value Ref Range Status   SARS Coronavirus 2 by RT PCR NEGATIVE NEGATIVE Final    Comment:  (NOTE) SARS-CoV-2 target nucleic acids are NOT DETECTED.  The SARS-CoV-2 RNA is generally detectable in upper respiratory specimens during the acute phase of infection. The lowest concentration of SARS-CoV-2 viral copies this assay can detect is 138 copies/mL. A negative result does not preclude SARS-Cov-2 infection and should not be used as the sole basis for treatment or other patient management decisions. A negative result may occur with  improper specimen collection/handling, submission of specimen other than nasopharyngeal swab, presence of viral mutation(s) within the areas targeted by this assay, and inadequate number of viral copies(<138 copies/mL). A negative result must be combined with clinical observations, patient history, and epidemiological information. The expected result is Negative.  Fact Sheet for Patients:  EntrepreneurPulse.com.au  Fact Sheet for Healthcare Providers:  IncredibleEmployment.be  This test is no t yet approved or cleared by the [place] FDA and  has been authorized for detection and/or diagnosis of SARS-CoV-2 by FDA under an Emergency Use Authorization (EUA). This EUA will remain  in effect (meaning this test can be used) for the duration of the COVID-19 declaration under Section 564(b)(1) of the Act, 21 U.S.C.section 360bbb-3(b)(1), unless the authorization is terminated  or revoked sooner.       Influenza A by PCR NEGATIVE NEGATIVE Final   Influenza B by PCR NEGATIVE NEGATIVE Final    Comment: (NOTE) The Xpert Xpress SARS-CoV-2/FLU/RSV plus assay is intended as an aid in the diagnosis of influenza from Nasopharyngeal swab specimens and should not be used as a sole basis for treatment. Nasal washings and aspirates are unacceptable for Xpert Xpress SARS-CoV-2/FLU/RSV testing.  Fact Sheet for Patients: EntrepreneurPulse.com.au  Fact Sheet for Healthcare  Providers: IncredibleEmployment.be  This test is not yet approved or cleared by the [place] FDA and has been authorized for detection and/or diagnosis of SARS-CoV-2 by FDA under an Emergency Use Authorization (EUA). This EUA will remain in effect (meaning this test can be used) for the duration of the COVID-19 declaration under Section 564(b)(1) of the Act, 21 U.S.C. section 360bbb-3(b)(1), unless the authorization is terminated or revoked.  Performed at [facility], [place] [address] [place], [place] [address]   Blood culture (routine x 2)     Status: None (Preliminary result)   Collection Time: 02/15/21  2:30 PM   Specimen: BLOOD  Result Value Ref Range Status   Specimen Description   Final    BLOOD RIGHT ANTECUBITAL Performed at [facility] [address], [place],  [address]    Special Requests  Final    BOTTLES DRAWN AEROBIC AND ANAEROBIC Blood Culture adequate volume Performed at [facility] [address] [place], [place] [address]    Culture   Final    NO GROWTH 4 DAYS Performed at [facility], [place] [address] [place], [place] [address]    Report Status PENDING  Incomplete  Blood culture (routine x 2)     Status: None (Preliminary result)   Collection Time: 02/15/21  2:30 PM   Specimen: BLOOD  Result Value Ref Range Status   Specimen Description   Final    BLOOD LEFT ANTECUBITAL Performed at [facility] [address], [place], [place] [address]    Special Requests   Final    BOTTLES DRAWN AEROBIC AND ANAEROBIC Blood Culture adequate volume Performed at [facility] [address], [place], [place] [address]    Culture   Final    NO GROWTH 4 DAYS Performed at [facility], [place] [address], [place], [place] [address]    Report Status PENDING  Incomplete     Labs: BNP (last 3 results) Recent Labs    01/05/21 1646  02/16/21 0500  BNP 151.3* 388.8*   Basic Metabolic Panel: Recent Labs  Lab 02/15/21 1247 02/15/21 2023 02/16/21 0500 02/17/21 0550 02/18/21 0552 02/19/21 0605 02/20/21 0522  NA 140  --  138 138 137  --  138  K 3.8  --  3.8 4.2 3.5  --  4.1  CL 96*  --  98 100 100  --  103  CO2 28  --  30 30 26   --  24  GLUCOSE 223*  --  191* 172* 175*  --  91  BUN 144*  --  139* 128* 125*  --  118*  CREATININE 2.57* 2.44* 2.71* 2.79* 2.59*  --  2.22*  CALCIUM 9.9  --  9.5 9.5 9.5  --  9.8  MG  --   --  2.0 1.7 1.8 1.9 1.9   Liver Function Tests: Recent Labs  Lab 02/15/21 1247 02/16/21 0500 02/17/21 0550 02/18/21 0552  AST 26 26 23 22   ALT 26 22 18 16   ALKPHOS 66 54 66 69  BILITOT 0.8 0.5 0.4 0.5  PROT 6.4* 6.1* 6.0* 5.7*  ALBUMIN 3.3* 3.0* 2.8* 2.7*   Recent Labs  Lab 02/15/21 1247  LIPASE 59*   No results for input(s): AMMONIA in the last 168 hours. CBC: Recent Labs  Lab 02/15/21 1247 02/15/21 2023 02/16/21 0500 02/17/21 0550 02/18/21 0552 02/19/21 0605 02/20/21 0522  WBC 18.1*   < > 14.4* 13.7* 11.2* 11.9* 9.4  NEUTROABS 16.3*  --   --   --   --   --   --   HGB 8.2*   < > 7.8* 8.2* 7.0* 7.7* 7.1*  HCT 28.3*   < > 26.1* 27.4* 23.9* 26.3* 24.0*  MCV 87.1   < > 85.3 86.2 88.5 88.9 88.6  PLT 294   < > 281 217 273 307 267   < > = values in this interval not displayed.   Cardiac Enzymes: No results for input(s): CKTOTAL, CKMB, CKMBINDEX, TROPONINI in the last 168 hours. BNP: Invalid input(s): POCBNP CBG: Recent Labs  Lab 02/19/21 0735 02/19/21 1151 02/19/21 1723 02/19/21 2118 02/20/21 0834  GLUCAP 156* 133* 222* 132* 106*   D-Dimer No results for input(s): DDIMER in the last 72 hours. Hgb A1c No results for input(s): HGBA1C in the  last 72 hours. Lipid Profile No results for input(s): CHOL, HDL, LDLCALC, TRIG, CHOLHDL, LDLDIRECT in the last 72 hours. Thyroid function studies No results for input(s): TSH, T4TOTAL, T3FREE, THYROIDAB in the last 72  hours.  Invalid input(s): FREET3 Anemia work up No results for input(s): VITAMINB12, FOLATE, FERRITIN, TIBC, IRON, RETICCTPCT in the last 72 hours. Urinalysis    Component Value Date/Time   COLORURINE YELLOW 02/15/2021 1602   APPEARANCEUR HAZY (A) 02/15/2021 1602   LABSPEC 1.009 02/15/2021 1602   PHURINE 6.0 02/15/2021 1602   GLUCOSEU NEGATIVE 02/15/2021 1602   HGBUR MODERATE (A) 02/15/2021 1602   BILIRUBINUR NEGATIVE 02/15/2021 1602   KETONESUR NEGATIVE 02/15/2021 1602   PROTEINUR NEGATIVE 02/15/2021 1602   UROBILINOGEN 0.2 08/04/2014 1409   NITRITE NEGATIVE 02/15/2021 1602   LEUKOCYTESUR NEGATIVE 02/15/2021 1602   Sepsis Labs Invalid input(s): PROCALCITONIN,  WBC,  LACTICIDVEN Microbiology Recent Results (from the past 240 hour(s))  Resp Panel by RT-PCR (Flu A&B, Covid) Nasopharyngeal Swab     Status: None   Collection Time: 02/15/21  1:19 PM   Specimen: Nasopharyngeal Swab; Nasopharyngeal(NP) swabs in vial transport medium  Result Value Ref Range Status   SARS Coronavirus 2 by RT PCR NEGATIVE NEGATIVE Final    Comment: (NOTE) SARS-CoV-2 target nucleic acids are NOT DETECTED.  The SARS-CoV-2 RNA is generally detectable in upper respiratory specimens during the acute phase of infection. The lowest concentration of SARS-CoV-2 viral copies this assay can detect is 138 copies/mL. A negative result does not preclude SARS-Cov-2 infection and should not be used as the sole basis for treatment or other patient management decisions. A negative result may occur with  improper specimen collection/handling, submission of specimen other than nasopharyngeal swab, presence of viral mutation(s) within the areas targeted by this assay, and inadequate number of viral copies(<138 copies/mL). A negative result must be combined with clinical observations, patient history, and epidemiological information. The expected result is Negative.  Fact Sheet for Patients:   EntrepreneurPulse.com.au  Fact Sheet for Healthcare Providers:  IncredibleEmployment.be  This test is no t yet approved or cleared by the [place] FDA and  has been authorized for detection and/or diagnosis of SARS-CoV-2 by FDA under an Emergency Use Authorization (EUA). This EUA will remain  in effect (meaning this test can be used) for the duration of the COVID-19 declaration under Section 564(b)(1) of the Act, 21 U.S.C.section 360bbb-3(b)(1), unless the authorization is terminated  or revoked sooner.       Influenza A by PCR NEGATIVE NEGATIVE Final   Influenza B by PCR NEGATIVE NEGATIVE Final    Comment: (NOTE) The Xpert Xpress SARS-CoV-2/FLU/RSV plus assay is intended as an aid in the diagnosis of influenza from Nasopharyngeal swab specimens and should not be used as a sole basis for treatment. Nasal washings and aspirates are unacceptable for Xpert Xpress SARS-CoV-2/FLU/RSV testing.  Fact Sheet for Patients: EntrepreneurPulse.com.au  Fact Sheet for Healthcare Providers: IncredibleEmployment.be  This test is not yet approved or cleared by the [place] FDA and has been authorized for detection and/or diagnosis of SARS-CoV-2 by FDA under an Emergency Use Authorization (EUA). This EUA will remain in effect (meaning this test can be used) for the duration of the COVID-19 declaration under Section 564(b)(1) of the Act, 21 U.S.C. section 360bbb-3(b)(1), unless the authorization is terminated or revoked.  Performed at [facility], [place] [address], [place],  [address]   Blood culture (routine x 2)     Status: None (Preliminary result)  Collection Time: 02/15/21  2:30 PM   Specimen: BLOOD  Result Value Ref Range Status   Specimen Description   Final    BLOOD RIGHT ANTECUBITAL Performed at [facility] [address], [place], [place] [address]     Special Requests   Final    BOTTLES DRAWN AEROBIC AND ANAEROBIC Blood Culture adequate volume Performed at [facility] [address] [place], [place] [address]    Culture   Final    NO GROWTH 4 DAYS Performed at [facility], [place] [address] [place], [place] [address]    Report Status PENDING  Incomplete  Blood culture (routine x 2)     Status: None (Preliminary result)   Collection Time: 02/15/21  2:30 PM   Specimen: BLOOD  Result Value Ref Range Status   Specimen Description   Final    BLOOD LEFT ANTECUBITAL Performed at [facility] [address], [place], [place] [address]    Special Requests   Final    BOTTLES DRAWN AEROBIC AND ANAEROBIC Blood Culture adequate volume Performed at [facility] [address], [place], [place] [address]    Culture   Final    NO GROWTH 4 DAYS Performed at [facility], [place] [address], [place], [place] [address]    Report Status PENDING  Incomplete     Time coordinating discharge:  I have spent 35 minutes face to face with the patient and on the ward discussing the patients care, assessment, plan and disposition with other care givers. >50% of the time was devoted counseling the patient about the risks and benefits of treatment/Discharge disposition and coordinating care.   SIGNED:   Damita Lack, MD  [facility] 02/20/2021, 11:51 AM   If 7PM-7AM, please contact night-coverage

## 2021-02-20 NOTE — Progress Notes (Signed)
[facility] arrived to pick patient up at 2232, called [facility] and spoke with Iris, patients nurse. Call report given to Iris.

## 2021-02-20 NOTE — Care Management Important Message (Signed)
Important Message  Patient Details IM Letter given to the Patient. Name: [name] [name] MRN: [id number] Date of Birth: July 28, 1951   Medicare Important Message Given:  Yes     Kerin Salen 02/20/2021, 9:31 AM

## 2021-02-20 NOTE — TOC Transition Note (Signed)
Transition of Care [facility]) - CM/SW Discharge Note   Patient Details  Name: [name] [name] MRN: [id number] Date of Birth: August 25, 1951  Transition of [facility] CM/SW Contact:  Ross Ludwig, LCSW Phone Number: 02/20/2021, 2:43 PM   Clinical Narrative:     CSW was informed patient tested positive for Covid.  CSW updated SNF, they are aware and can still accept patient today.  Patient to be d/c'ed today to [facility] room 301  Patient and family agreeable to plans will transport via ems RN to call report to [phone].  Patient's husband [name] [name] [phone] is aware that patient is discharging today.  Final next level of care: Skilled Nursing Facility Barriers to Discharge: Barriers Resolved   Patient Goals and CMS Choice Patient states their goals for this hospitalization and ongoing recovery are:: To go to SNF for short term rehab, then return back home. CMS Medicare.gov Compare Post Acute Care list provided to:: Patient Represenative (must comment) Choice offered to / list presented to : Spouse  Discharge Placement PASRR number recieved: 02/17/21            Patient chooses bed at: [facility] Patient to be transferred to facility by: [facility] Name of family member notified: Patient's husband [name] [name] Patient and family notified of of transfer: 02/20/21  Discharge Plan and Services In-house Referral: Clinical Social Work   Post Acute Care Choice: [place]          DME Arranged: N/A DME Agency: NA                  Social Determinants of Health ([place]) Interventions     Readmission Risk Interventions Readmission Risk Prevention Plan 01/14/2020  Transportation Screening Complete  Medication Review Press photographer) Complete  PCP or Specialist appointment within 3-5 days of discharge Complete  HRI or Home Care Consult Complete  SW Recovery Care/Counseling Consult Complete  [place] Complete  Some recent data might be hidden

## 2021-02-20 NOTE — Progress Notes (Signed)
Physical Therapy Treatment Patient Details Name: [name] [name] MRN: [id number] DOB: [birth year] Today's Date: 02/20/2021    History of Present Illness Patient is a 70 year old female admitted from home due to weakness, ongoing nausea/vomiting. Pt with COVID positive test 5/16.  PMH includes systolic CHF with EF 85%, GERD, erosive gastritis, COPD, hypothyroidism, essential hypertension, iron deficiency anemia, CKD stage V    PT Comments    Pt admitted with above diagnosis. Pt was able to work with PT using RW to transfer to 3N1 and then to recliner. Going to SNF today.   Pt currently with functional limitations due to the deficits listed below (see PT Problem List). Pt will benefit from skilled PT to increase their independence and safety with mobility to allow discharge to the venue listed below.     Follow Up Recommendations  SNF     Equipment Recommendations  None recommended by PT    Recommendations for Other Services       Precautions / Restrictions Precautions Precautions: Fall Precaution Comments: Bil feet swollen - R>L, COVID positive test 5/16 - on precautions Restrictions Weight Bearing Restrictions: No    Mobility  Bed Mobility               General bed mobility comments: seated EOB upon arrival    Transfers Overall transfer level: Needs assistance Equipment used: Rolling walker (2 wheeled) Transfers: Sit to/from Stand Sit to Stand: +2 safety/equipment;Min assist;Min guard;From elevated surface Stand pivot transfers: Min assist;+2 safety/equipment       General transfer comment: Pt getting ready for transport to SNF but wanted to get on 3N1.  Assisted pt with donning socks, breif and pants as well as shirt with total assist.  She then transferred to the 3N1.  then pt transferred to recliner taking a few steps to recliner  Ambulation/Gait Ambulation/Gait assistance: Min assist;+2 safety/equipment Gait Distance (Feet): 4 Feet Assistive device: Rolling  walker (2 wheeled) Gait Pattern/deviations: Step-to pattern;Decreased stride length;Decreased weight shift to right;Trunk flexed;Wide base of support Gait velocity: decreased Gait velocity interpretation: <1.31 ft/sec, indicative of household ambulator General Gait Details: Increased time with pt moving very slowly to walk to the recliner.  Cues for posture, sequence and position from RW.  Physical assist for balance/support and RW management.  Distance ltd by fatigue and LBP   Stairs             Wheelchair Mobility    Modified Rankin (Stroke Patients Only)       Balance Overall balance assessment: Needs assistance Sitting-balance support: Feet supported Sitting balance-Leahy Scale: Good Sitting balance - Comments: seated EOB   Standing balance support: Bilateral upper extremity supported Standing balance-Leahy Scale: Poor Standing balance comment: reliant on UE support and external support                            Cognition Arousal/Alertness: Awake/alert Behavior During Therapy: WFL for tasks assessed/performed Overall Cognitive Status: Within Functional Limits for tasks assessed                                 General Comments: very pleasant but tangential      Exercises General Exercises - Lower Extremity Long Arc Quad: AROM;Both;10 reps;Seated Hip Flexion/Marching: AROM;Both;10 reps;Seated    General Comments        Pertinent Vitals/Pain Pain Assessment: Faces Faces Pain Scale: Hurts even  more Pain Location: low back Pain Descriptors / Indicators: Throbbing;Aching;Sore Pain Intervention(s): Limited activity within patient's tolerance;Monitored during session;Repositioned    Home Living                      Prior Function            PT Goals (current goals can now be found in the care plan section) Acute Rehab PT Goals PT Goal Formulation: With patient Time For Goal Achievement: 03/03/21 Potential to Achieve  Goals: Good Progress towards PT goals: Progressing toward goals    Frequency    Min 2X/week      PT Plan Frequency needs to be updated;Current plan remains appropriate    Co-evaluation              AM-PAC PT "6 Clicks" Mobility   Outcome Measure  Help needed turning from your back to your side while in a flat bed without using bedrails?: A Lot Help needed moving from lying on your back to sitting on the side of a flat bed without using bedrails?: A Lot Help needed moving to and from a bed to a chair (including a wheelchair)?: A Little Help needed standing up from a chair using your arms (e.g., wheelchair or bedside chair)?: A Little Help needed to walk in hospital room?: A Little Help needed climbing 3-5 steps with a railing? : A Lot 6 Click Score: 15    End of Session Equipment Utilized During Treatment: Gait belt Activity Tolerance: Patient limited by pain;Patient limited by fatigue Patient left: in chair;with call bell/phone within reach;with chair alarm set;with nursing/sitter in room Nurse Communication: Mobility status PT Visit Diagnosis: Unsteadiness on feet (R26.81);Other abnormalities of gait and mobility (R26.89);Muscle weakness (generalized) (M62.81);Pain;Difficulty in walking, not elsewhere classified (R26.2)     Time: 5670-1410 PT Time Calculation (min) (ACUTE ONLY): 34 min  Charges:  $Gait Training: 8-22 mins $Therapeutic Exercise: 8-22 mins                     [name] [name],PT Acute Rehab Services [phone] (pager)   [name] [name] 02/20/2021, 4:04 PM

## 2021-02-20 NOTE — Progress Notes (Signed)
Patient discharging to [facility] via [facility]. Belongings returned. Patient education will be provided.

## 2021-02-21 ENCOUNTER — Non-Acute Institutional Stay (SKILLED_NURSING_FACILITY): Payer: Medicare Other | Admitting: Internal Medicine

## 2021-02-21 ENCOUNTER — Encounter: Payer: Self-pay | Admitting: Internal Medicine

## 2021-02-21 DIAGNOSIS — R112 Nausea with vomiting, unspecified: Secondary | ICD-10-CM

## 2021-02-21 DIAGNOSIS — E1122 Type 2 diabetes mellitus with diabetic chronic kidney disease: Secondary | ICD-10-CM

## 2021-02-21 DIAGNOSIS — N184 Chronic kidney disease, stage 4 (severe): Secondary | ICD-10-CM | POA: Diagnosis not present

## 2021-02-21 DIAGNOSIS — U071 COVID-19: Secondary | ICD-10-CM

## 2021-02-21 DIAGNOSIS — Z794 Long term (current) use of insulin: Secondary | ICD-10-CM

## 2021-02-21 DIAGNOSIS — E039 Hypothyroidism, unspecified: Secondary | ICD-10-CM

## 2021-02-21 DIAGNOSIS — D62 Acute posthemorrhagic anemia: Secondary | ICD-10-CM | POA: Diagnosis not present

## 2021-02-21 NOTE — Assessment & Plan Note (Signed)
Baseline creatinine felt to be 3; current creatinine 2.22 with GFR 23. Avoid nephrotoxic drugs.

## 2021-02-21 NOTE — Assessment & Plan Note (Signed)
No active GI symptoms @ present

## 2021-02-21 NOTE — Patient Instructions (Signed)
See assessment and plan under each diagnosis in the problem list and acutely for this visit 

## 2021-02-21 NOTE — Progress Notes (Signed)
NURSING HOME LOCATION:  [facility] ROOM NUMBER:  301 A  CODE STATUS: Full Code  PCP:  [name] MD  This is a comprehensive admission note to this SNFperformed on this date less than 30 days from date of admission. Included are preadmission medical/surgical history; reconciled medication list; family history; social history and comprehensive review of systems.  Corrections and additions to the records were documented. Comprehensive physical exam was also performed. Additionally a clinical summary was entered for each active diagnosis pertinent to this admission in the Problem List to enhance continuity of care.  HPI: Patient was hospitalized 5/11 - 02/20/2021 admitted from home with nausea and vomiting in the context of CKD stage V. She had recently been admitted approximately 1 month prior for blood loss anemia with EGD revealing erosive gastritis.  At that time she was discharged on PPI twice daily. Clinically there was concerned for progression of the gastric erosion or bowel wall edema.  Lipase was negative; CT of the abdomen/pelvis was unremarkable. Labs did reveal severe hypothyroidism; TSH was 10 and T4 0.5.  She has been noncompliant with her thyroid supplement.  L-thyroxine was reinitiated at 200 mcg daily. Baseline creatinine is felt to be 3; at discharge creatinine was 2.22 & GFR 23. Current H/H 7.1/24. An incidental finding the day of discharge was positive COVID screen. She was discharged to the SNF for PT/OT rehab.  Past medical and surgical history: Includes PAD, diabetes with CKD stage IV/V, morbid obesity, essential hypertension, CAD,hx of TIA,OSA, hx of gout,and chronic diastolic congestive heart failure. Surgeries and procedures include hysterectomy, EGDs, I&D of wounds/abscesses, thyroid RAI therapy, and total hip revision.  Social history: Occasional alcohol intake; history of smoking for decades at least a quarter of a pack per  day.  Family history: Reviewed   Review of systems: Despite the positive COVID screening she denies having COVID. An extensive COVID review of systems was totally negative.  She states that she is "all right".  She states that she was hospitalized because "thyroid off whack; they thought I had pancreas".  She denies any active GI symptoms except some constipation.  She does admit to being depressed about being in the facility.  She is frustrated in that she urinates on herself because she cannot get to the bathroom without help and she feels the facility is short staffed.  Constitutional: No fever, significant weight change, fatigue  Eyes: No redness, discharge, pain, vision change ENT/mouth: No nasal congestion, purulent discharge, earache, change in hearing, sore throat  Cardiovascular: No chest pain, palpitations, paroxysmal nocturnal dyspnea, claudication, edema  Respiratory: No cough, sputum production, hemoptysis, DOE, significant snoring, apnea Gastrointestinal: No heartburn, dysphagia, abdominal pain, nausea /vomiting, rectal bleeding, melena, change in bowels Genitourinary: No dysuria, hematuria, pyuria, incontinence, nocturia Musculoskeletal: No joint stiffness, joint swelling Dermatologic: No rash, pruritus, change in appearance of skin Neurologic: No dizziness, headache, syncope, seizures, numbness, tingling Psychiatric: No significant anxiety,  insomnia, anorexia Endocrine: No change in hair/skin/nails, excessive thirst, excessive hunger, excessive urination  Hematologic/lymphatic: No significant bruising, lymphadenopathy, abnormal bleeding Allergy/immunology: No itchy/watery eyes, significant sneezing, urticaria, angioedema  Physical exam:  Pertinent or positive findings: She is morbidly obese.  Voice is deep.  Proptosis is suggested with slight lid lag.  The maxilla is edentulous.  Breath sounds are decreased.  Heart sounds are somewhat distant.  Abdomen is protuberant.  Posterior  tibial pulses are stronger than dorsalis pedis pulses but difficult to palpate due to edema.  There is  1+ pitting edema of the lower extremities.  Strength to opposition is fair except in the right upper extremity which is weaker than the other extremities.  She has scattered bruising over the right upper extremity.  General appearance: no acute distress, increased work of breathing is present.   Lymphatic: No lymphadenopathy about the head, neck, axilla. Eyes: No conjunctival inflammation or lid edema is present. There is no scleral icterus. Ears:  External ear exam shows no significant lesions or deformities.   Nose:  External nasal examination shows no deformity or inflammation. Nasal mucosa are pink and moist without lesions, exudates Oral exam: Lips and gums are healthy appearing.There is no oropharyngeal erythema or exudate. Neck:  No thyromegaly, masses, tenderness noted.    Heart:  No gallop, murmur, click, rub.  Lungs:  without wheezes, rhonchi, rales, rubs. Abdomen: Bowel sounds are normal.  Abdomen is soft and nontender with no organomegaly, hernias, masses. GU: Deferred  Extremities:  No cyanosis, clubbing. Neurologic exam:Balance, [place], finger to nose testing could not be completed due to clinical state Skin: Warm & dry w/o tenting.  See clinical summary under each active problem in the Problem List with associated updated therapeutic plan

## 2021-02-21 NOTE — Assessment & Plan Note (Signed)
Current A1c 6.8%; glucoses in hospital 91-223. Apparently no discordance despite creat 2.22/ GFR 23

## 2021-02-21 NOTE — Assessment & Plan Note (Signed)
COVID ROS negative

## 2021-02-21 NOTE — Assessment & Plan Note (Addendum)
Current H/H 7.1/24 in context of CKD Stage 4/5

## 2021-02-21 NOTE — Assessment & Plan Note (Signed)
Proper administration discussed with her; ie taken alone in am with water

## 2021-02-22 ENCOUNTER — Other Ambulatory Visit: Payer: Self-pay | Admitting: Adult Health

## 2021-02-22 MED ORDER — OXYCODONE-ACETAMINOPHEN 5-325 MG PO TABS: 1.0000 | ORAL_TABLET | Freq: Four times a day (QID) | ORAL | 0 refills | Status: DC | PRN

## 2021-03-03 ENCOUNTER — Encounter: Payer: Self-pay | Admitting: Adult Health

## 2021-03-03 ENCOUNTER — Non-Acute Institutional Stay (SKILLED_NURSING_FACILITY): Payer: Medicare Other | Admitting: Adult Health

## 2021-03-03 DIAGNOSIS — N184 Chronic kidney disease, stage 4 (severe): Secondary | ICD-10-CM

## 2021-03-03 DIAGNOSIS — E039 Hypothyroidism, unspecified: Secondary | ICD-10-CM

## 2021-03-03 DIAGNOSIS — R112 Nausea with vomiting, unspecified: Secondary | ICD-10-CM

## 2021-03-03 DIAGNOSIS — I2510 Atherosclerotic heart disease of native coronary artery without angina pectoris: Secondary | ICD-10-CM | POA: Diagnosis not present

## 2021-03-03 DIAGNOSIS — Z794 Long term (current) use of insulin: Secondary | ICD-10-CM

## 2021-03-03 DIAGNOSIS — I5023 Acute on chronic systolic (congestive) heart failure: Secondary | ICD-10-CM | POA: Diagnosis not present

## 2021-03-03 DIAGNOSIS — E1122 Type 2 diabetes mellitus with diabetic chronic kidney disease: Secondary | ICD-10-CM

## 2021-03-03 NOTE — Progress Notes (Signed)
Location:  [facility] Room Number: 126-B Place of Service:  SNF (31) Provider:  Durenda Age, DNP, FNP-BC  Patient Care Team: [name], MD as PCP - General (Internal Medicine) Larey Dresser, MD as PCP - Advanced Heart Failure (Cardiology) Lorretta Harp, MD as Consulting Physician (Cardiology) Estanislado Emms, MD (Inactive) as Consulting Physician (Nephrology) [name], MD (Internal Medicine)  Extended Emergency Contact Information Primary Emergency Contact: Phifer,Ray Mobile Phone: [phone] Relation: Son Secondary Emergency Contact: Patrie,[name] Address: [address]          [place], [place] [address] Johnnette Litter of [place] Phone: [phone] Mobile Phone: [phone] Relation: Spouse Preferred language: English Interpreter needed? No  Code Status:   Full Code  Goals of care: Advanced Directive information Advanced Directives 03/03/2021  Does Patient Have a Medical Advance Directive? No  Would patient like information on creating a medical advance directive? No - Patient declined  Pre-existing out of facility DNR order (yellow form or pink MOST form) -     Chief Complaint  Patient presents with  . Discharge Note    Discharge from SNF    HPI:  Pt is a 70 y.o. female who is for discharge home with Home health PT, OT and Aide.  She was admitted to [facility] on 02/20/21 post hospital admission 02/15/21 to 02/20/21 for intractable nausea and vomiting.  Of note, she was recently hospitalized 4 weeks ago for blood loss anemia requiring endoscopy which showed erosive gastritis.  She was discharged on PPI twice daily with outpatient follow-up.  She was hospitalized again for nausea and vomiting which was thought to be due to worsening gastric erosion or bowel wall edema.  Lab work revealed severe hypothyroidism, tsh 10.814.  Her Synthroid was restarted at 200 mcg daily.  She was incidentally tested  positive for COVID-19 on 5/16, on discharge from the hospital.  She was asymptomatic.   Past Medical History:  Diagnosis Date  . Anemia   . Arthritis   . Asthma   . Bronchitis   . CHF (congestive heart failure) ([place])   . Chronic kidney disease   . Chronic kidney disease   . Chronic pain syndrome   . COPD (chronic obstructive pulmonary disease) ([place])   . Cramping of feet   . Diabetes mellitus   . Diabetes mellitus without complication ([place])   . Dyspnea   . Full dentures   . GERD (gastroesophageal reflux disease)   . Gout   . Headache   . History of hiatal hernia   . History of hip replacement, total   . History of transfusion   . Hx MRSA infection    abscess left groin  . Hx of cardiac cath 06/2014  . Hyperlipidemia   . Hypertension   . Hypothyroid   . 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 Morbidly obese ([place])   . Peripheral vascular disease ([place])   . Positive cardiac stress test 12/2013   anterior and lateral ischemia on Myoview  . Sleep apnea    UNABLE TO TOLERATE C PAP  . Stress incontinence   . Thyroid disease   . TIA (transient ischemic attack)     X2 NO RESIDUAL PROBLEMS  . Wears glasses    Past Surgical History:  Procedure Laterality Date  . ABDOMINAL HYSTERECTOMY    . BACK SURGERY  2020  . BASCILIC VEIN TRANSPOSITION Left 04/05/2020   Procedure: BASILIC VEIN TRANSPOSITION FIRST STAGE;  Surgeon:  Angelia Mould, MD;  Location: [facility] OR;  Service: Vascular;  Laterality: Left;  . COLON SURGERY  1995   DUE TO POLYP  . DILATION AND CURETTAGE OF UTERUS    . ESOPHAGOGASTRODUODENOSCOPY N/A 01/25/2021   Procedure: ESOPHAGOGASTRODUODENOSCOPY (EGD);  Surgeon: Clarene Essex, MD;  Location: [facility];  Service: Endoscopy;  Laterality: N/A;  . FOREARM FRACTURE SURGERY     Left arm  . HERNIA REPAIR     w/ mesh  . INCISION AND DRAINAGE ABSCESS Left 02/04/2013   Procedure: INCISION AND DRAINAGE LEFT BUTTOCK ABSCESS; INCISION AND DRAINAGE LEFT BREAST ABSCESS;   Surgeon: Harl Bowie, MD;  Location: [facility];  Service: General;  Laterality: Left;  . INCISION AND DRAINAGE ABSCESS N/A 02/12/2013   Procedure: INCISION AND DEBRIDEMENT BUTTOCK WOUND ;  Surgeon: [name]. Georgette Dover, MD;  Location: [facility];  Service: General;  Laterality: N/A;  . INCISION AND DRAINAGE ABSCESS N/A 02/14/2013   Procedure: INCISION AND DRAINAGE/DRESSING CHANGE;  Surgeon: Harl Bowie, MD;  Location: [facility];  Service: General;  Laterality: N/A;  . INCISION AND DRAINAGE ABSCESS N/A 03/21/2015   Procedure: INCISION AND DRAINAGE PUBIC ABSCESS;  Surgeon: Excell Seltzer, MD;  Location: [facility];  Service: General;  Laterality: N/A;  . INCISION AND DRAINAGE PERIRECTAL ABSCESS Left 02/10/2013   Procedure: IRRIGATION AND DEBRIDEMENT OF BUTTOCK/PERINEAL ABSCESS;  Surgeon: [name]. Georgette Dover, MD;  Location: [facility];  Service: General;  Laterality: Left;  . INCISION AND DRAINAGE PERIRECTAL ABSCESS N/A 02/16/2013   Procedure: IRRIGATION AND DEBRIDEMENT PERINEAL ABSCESS;  Surgeon: Zenovia Jarred, MD;  Location: [facility];  Service: General;  Laterality: N/A;  . IRRIGATION AND DEBRIDEMENT ABSCESS N/A 02/06/2013   Procedure: IRRIGATION AND DEBRIDEMENT BUTTOCK ABSCESS AND DRESSING CHANGE;  Surgeon: Harl Bowie, MD;  Location: [facility];  Service: General;  Laterality: N/A;  . IRRIGATION AND DEBRIDEMENT ABSCESS Left 02/08/2013   Procedure: IRRIGATION AND DEBRIDEMENT ABSCESS/DRESSING CHANGE;  Surgeon: Gwenyth Ober, MD;  Location: [facility];  Service: General;  Laterality: Left;  . JOINT REPLACEMENT  2010 / 2012   . LAPAROSCOPIC CHOLECYSTECTOMY    . LEFT HEART CATHETERIZATION WITH CORONARY ANGIOGRAM N/A 07/02/2014   Procedure: LEFT HEART CATHETERIZATION WITH CORONARY ANGIOGRAM;  Surgeon: Lorretta Harp, MD;  Location: [facility];  Service: Cardiovascular;  Laterality: N/A;  . LOWER EXTREMITY ANGIOGRAM Right 04/20/2016   Procedure: Lower Extremity Angiogram;  Surgeon: Elam Dutch, MD;  Location: [facility]  CV LAB;  Service: Cardiovascular;  Laterality: Right;  . MULTIPLE TOOTH EXTRACTIONS    . PERIPHERAL VASCULAR CATHETERIZATION N/A 04/20/2016   Procedure: Abdominal Aortogram;  Surgeon: Elam Dutch, MD;  Location: [facility] CV LAB;  Service: Cardiovascular;  Laterality: N/A;  . PERIPHERAL VASCULAR CATHETERIZATION Right 04/20/2016   Procedure: Peripheral Vascular Intervention;  Surgeon: Elam Dutch, MD;  Location: [facility] CV LAB;  Service: Cardiovascular;  Laterality: Right;  popiteal  . RIGHT HEART CATH N/A 10/27/2018   Procedure: RIGHT HEART CATH;  Surgeon: Larey Dresser, MD;  Location: [facility] CV LAB;  Service: Cardiovascular;  Laterality: N/A;  . RIGHT HEART CATH N/A 03/20/2019   Procedure: RIGHT HEART CATH;  Surgeon: Larey Dresser, MD;  Location: [facility] CV LAB;  Service: Cardiovascular;  Laterality: N/A;  . THYROIDECTOMY    . TOTAL HIP REVISION Right 08/11/2014   Procedure: RIGHT ACETABULAR REVISION;  Surgeon: Gearlean Alf, MD;  Location: [facility];  Service: Orthopedics;  Laterality: Right;  .  VASCULAR SURGERY      Allergies  Allergen Reactions  . Nebivolol Swelling    Chest pain (reaction to Bystolic)  . Ace Inhibitors Swelling and Other (See Comments)    Tongue swell  . Ace Inhibitors Swelling  . Morphine And Related Itching  . Morphine And Related Itching    Outpatient Encounter Medications as of 03/03/2021  Medication Sig  . acetaminophen (TYLENOL) 325 MG tablet Take 650 mg by mouth every 6 (six) hours as needed.  Marland Kitchen allopurinol (ZYLOPRIM) 100 MG tablet Take 100 mg by mouth daily.  . B Complex Vitamins (VITAMIN B COMPLEX PO) Take 1 capsule by mouth daily.  . bisacodyl (DULCOLAX) 10 MG suppository Place 10 mg rectally as needed for moderate constipation.  . carvedilol (COREG) 12.5 MG tablet Take 12.5 mg by mouth 2 (two) times daily.  . cholecalciferol (VITAMIN D3) 25 MCG (1000 UNIT) tablet Take 1,000 Units by mouth daily.  . cinacalcet (SENSIPAR) 30 MG  tablet Take 30 mg by mouth daily.  . clopidogrel (PLAVIX) 75 MG tablet TAKE 1 TABLET BY MOUTH EVERY DAY  . diclofenac Sodium (VOLTAREN) 1 % GEL Apply 1 application topically 4 (four) times daily as needed (pain).  . hydrOXYzine (ATARAX/VISTARIL) 25 MG tablet Take 25 mg by mouth 2 (two) times daily as needed for anxiety.  . insulin lispro protamine-lispro (HUMALOG 75/25 MIX) (75-25) 100 UNIT/ML SUSP injection Inject 30-40 Units into the skin See admin instructions. Inject 40 units into the skin morning and 30 units in the evening  . levothyroxine (SYNTHROID) 200 MCG tablet Take 1 tablet (200 mcg total) by mouth daily at 6 (six) AM.  . Magnesium Hydroxide (MILK OF MAGNESIA PO) Take 30 mLs by mouth as needed.  . magnesium oxide (MAG-OX) 400 (241.3 Mg) MG tablet Take 1 tablet (400 mg total) by mouth 2 (two) times daily.  . metoCLOPramide (REGLAN) 5 MG tablet Take 5 mg by mouth 3 (three) times daily.  . metolazone (ZAROXOLYN) 5 MG tablet Take 5 mg by mouth 3 (three) times a week. Takes 5mg  on Monday, Wednesday and Friday  . Multiple Vitamin (MULTIVITAMIN WITH MINERALS) TABS tablet Take 1 tablet by mouth daily.  Marland Kitchen oxyCODONE-acetaminophen (PERCOCET/ROXICET) 5-325 MG tablet Take 1 tablet by mouth every 6 (six) hours as needed for moderate pain or severe pain.  . pantoprazole (PROTONIX) 40 MG tablet Take 1 tablet (40 mg total) by mouth 2 (two) times daily before a meal for 60 days, THEN 1 tablet (40 mg total) daily.  . Polyethylene Glycol 3350 POWD 17 g by Does not apply route daily. Mix with 6-8 oz of fluid choice  . polyvinyl alcohol (LIQUIFILM TEARS) 1.4 % ophthalmic solution Place 2 drops into both eyes daily as needed for dry eyes.  . Potassium Chloride ER 20 MEQ TBCR Take 1 tablet by mouth 3 (three) times a week. Tuesday, Thursdays and Saturdays  . Sodium Phosphates (RA SALINE ENEMA RE) Place rectally as needed.  . torsemide (DEMADEX) 100 MG tablet Take 100 mg by mouth 2 (two) times daily.  .  TRULICITY 1.5 QA/8.3MH SOPN Inject 1.5 mg into the skin once a week. Monday   No facility-administered encounter medications on file as of 03/03/2021.    Review of Systems  GENERAL: No change in appetite, no fatigue, no weight changes, no fever, chills or weakness MOUTH and THROAT: Denies oral discomfort, gingival pain or bleeding RESPIRATORY: no cough, SOB, DOE, wheezing, hemoptysis CARDIAC: No chest pain, edema or palpitations GI: + nausea  GU: Denies dysuria, frequency, hematuria, incontinence, or discharge NEUROLOGICAL: Denies dizziness, syncope, numbness, or headache PSYCHIATRIC: Denies feelings of depression or anxiety. No report of hallucinations, insomnia, paranoia, or agitation   Immunization History  Administered Date(s) Administered  . PFIZER(Purple Top)SARS-COV-2 Vaccination 06/16/2020   Pertinent  Health Maintenance Due  Topic Date Due  . FOOT EXAM  Never done  . OPHTHALMOLOGY EXAM  Never done  . URINE MICROALBUMIN  Never done  . COLONOSCOPY (Pts 45-63yrs Insurance coverage will need to be confirmed)  Never done  . PNA vac Low Risk Adult (1 of 2 - PCV13) Never done  . INFLUENZA VACCINE  05/08/2021  . MAMMOGRAM  05/13/2021  . HEMOGLOBIN A1C  08/18/2021  . DEXA SCAN  Completed   Fall Risk  07/23/2013 07/09/2013  Falls in the past year? No No  Risk for fall due to : - Impaired balance/gait     Vitals:   03/03/21 1333  BP: 122/75  Pulse: 75  Resp: 18  Temp: (!) 97.3 F (36.3 C)  Weight: 220 lb 9.6 oz (100.1 kg)  Height: 5\' 3"  (1.6 m)   Body mass index is 39.08 kg/m.  Physical Exam  GENERAL APPEARANCE: Well nourished. In no acute distress. Normal body habitus SKIN:  Skin is warm and dry.  MOUTH and THROAT: Lips are without lesions. Oral mucosa is moist and without lesions. Tongue is normal in shape, size, and color and without lesions RESPIRATORY: Breathing is even & unlabored, BS CTAB CARDIAC: RRR, no murmur,no extra heart sounds, BLE2+ edema GI:  Abdomen soft, normal BS, no masses, no tenderness EXTREMITIES:  Limited ROM on right shoulder. NEUROLOGICAL: There is no tremor. Speech is clear. Alert and oriented X 3. PSYCHIATRIC:  Affect and behavior are appropriate  Labs reviewed: Recent Labs    04/22/20 1129 10/06/20 0925 10/17/20 1234 12/01/20 1119 01/05/21 1645 02/17/21 0550 02/18/21 0552 02/19/21 0605 02/20/21 0522  NA 135 135   < > 138   < > 138 137  --  138  K 3.2* 3.2*   < > 3.4*   < > 4.2 3.5  --  4.1  CL 90* 94*   < > 94*   < > 100 100  --  103  CO2 31 26   < > 29   < > 30 26  --  24  GLUCOSE 189* 151*   < > 133*   < > 172* 175*  --  91  BUN 141* 141*   < > 131*   < > 128* 125*  --  118*  CREATININE 3.43* 3.48*   < > 3.19*   < > 2.79* 2.59*  --  2.22*  CALCIUM 10.3  10.8* 8.9  9.2   < > 9.5  9.4   < > 9.5 9.5  --  9.8  MG  --   --   --   --    < > 1.7 1.8 1.9 1.9  PHOS 3.7 4.3  --  4.8*  --   --   --   --   --    < > = values in this interval not displayed.   Recent Labs    02/16/21 0500 02/17/21 0550 02/18/21 0552  AST 26 23 22   ALT 22 18 16   ALKPHOS 54 66 69  BILITOT 0.5 0.4 0.5  PROT 6.1* 6.0* 5.7*  ALBUMIN 3.0* 2.8* 2.7*   Recent Labs    10/17/20 1234 10/17/20 1235 01/05/21 1645 01/23/21 1049  02/15/21 1247 02/15/21 2023 02/18/21 0552 02/19/21 0605 02/20/21 0522  WBC 11.5*   < > 16.9*   < > 18.1*   < > 11.2* 11.9* 9.4  NEUTROABS 9.1*  --  14.1*  --  16.3*  --   --   --   --   HGB 6.9*   < > 9.6*   < > 8.2*   < > 7.0* 7.7* 7.1*  HCT 23.5*   < > 31.0*   < > 28.3*   < > 23.9* 26.3* 24.0*  MCV 74.1*   < > 82.7   < > 87.1   < > 88.5 88.9 88.6  PLT 405*   < > 399   < > 294   < > 273 307 267   < > = values in this interval not displayed.   Lab Results  Component Value Date   TSH 10.814 (H) 02/15/2021   Lab Results  Component Value Date   HGBA1C 6.8 (H) 02/15/2021   Lab Results  Component Value Date   CHOL 130 03/22/2015   HDL 27 (L) 03/22/2015   LDLCALC 74 03/22/2015   TRIG 144  03/22/2015   CHOLHDL 4.8 03/22/2015    Significant Diagnostic Results in last 30 days:  CT ABDOMEN PELVIS WO CONTRAST  Result Date: 02/15/2021 CLINICAL DATA:  Nausea/vomiting, generalized abdominal pain EXAM: CT ABDOMEN AND PELVIS WITHOUT CONTRAST TECHNIQUE: Multidetector CT imaging of the abdomen and pelvis was performed following the standard protocol without IV contrast. COMPARISON:  CT 01/06/2020, chest CT 08/10/2019 FINDINGS: Lower chest: There are 2 left lower lobe pulmonary nodules, 1 of which measures 12 mm and is stable since and November 2020, the other measures 8 mm and is new since recent CT on 01/06/2020 (series 6, image 1 see arrow). This is partially visualized on the first slice on the scan. Hepatobiliary: Unchanged mild hepatomegaly and mildly nodular contour. Prior cholecystectomy. Pancreas: Unremarkable. Spleen: Unremarkable. Adrenals/Urinary Tract: Unchanged bilateral low-density nodular adrenal thickening. Multiple bilateral renal cysts which are unchanged from recent prior exam. Nonspecific bilateral perinephric stranding. No hydronephrosis, nephrolithiasis, or urolithiasis. Stomach/Bowel: Prior left hemicolectomy. No evidence of bowel obstruction. Normal appendix. Scattered colonic diverticuli. Vascular/Lymphatic: Aorto bi-iliac atherosclerotic calcifications. No AAA. Reproductive: Prior hysterectomy. Other: No abdominopelvic ascites. Musculoskeletal: No suspicious lytic or blastic lesions. Multilevel degenerative changes of the spine. Prior posterior and interbody fusion at L4-L5 partially visualized right total hip arthroplasty. IMPRESSION: No acute findings to explain the patient's pain. No evidence of bowel obstruction. Normal appendix. Two lef lower lobe pulmonary nodules, one of which measures 12 mm and stable since November 2020, the other measures 8 mm and is NEW since 01/06/2020. Recommend non-emergent noncontrast CT of the chest as an outpatient to further evaluate as this is  partially visualized. Electronically Signed   By: Maurine Simmering   On: 02/15/2021 15:47   DG Chest Portable 1 View  Result Date: 02/15/2021 CLINICAL DATA:  Status post vomiting x2 today. EXAM: PORTABLE CHEST 1 VIEW COMPARISON:  Single-view of the chest 01/05/2021 and 01/06/2020. FINDINGS: There is cardiomegaly without edema. Lungs clear. No pneumothorax or pleural fluid. No acute or focal bony abnormality. IMPRESSION: No acute disease. Cardiomegaly. Electronically Signed   By: Inge Rise M.D.   On: 02/15/2021 14:06   ECHOCARDIOGRAM COMPLETE  Result Date: 02/16/2021    ECHOCARDIOGRAM REPORT   Patient Name:   SERINE KEA Date of Exam: 02/16/2021 Medical Rec #:  [id number]  Height:       63.0 in Accession #:    [id number]        Weight:       199.5 lb Date of Birth:  11-26-1950          BSA:          1.932 m Patient Age:    45 years          BP:           118/83 mmHg Patient Gender: F                 HR:           61 bpm. Exam Location:  Inpatient Procedure: 2D Echo, Cardiac Doppler and Color Doppler Indications:    I42.9 Cardiomyopathy (unspecified)  History:        Patient has prior history of Echocardiogram examinations, most                 recent 03/18/2019. CHF, COPD and TIA, Signs/Symptoms:Dyspnea;                 Risk Factors:Hypertension, Diabetes, Dyslipidemia and Sleep                 Apnea. CKD. GERD. Hypothroidism.  Sonographer:    Jonelle Sidle Dance Referring Phys: [id number] ANKIT CHIRAG AMIN IMPRESSIONS  1. Left ventricular ejection fraction, by estimation, is 45 to 50%. The left ventricle has mildly decreased function. The left ventricle has no regional wall motion abnormalities. There is moderate left ventricular hypertrophy. Left ventricular diastolic parameters are consistent with Grade I diastolic dysfunction (impaired relaxation).  2. Right ventricular systolic function is normal. The right ventricular size is normal. There is normal pulmonary artery systolic pressure.  3. Left atrial  size was severely dilated.  4. The mitral valve is normal in structure. Mild mitral valve regurgitation.  5. The aortic valve is normal in structure. Aortic valve regurgitation is not visualized. FINDINGS  Left Ventricle: Left ventricular ejection fraction, by estimation, is 45 to 50%. The left ventricle has mildly decreased function. The left ventricle has no regional wall motion abnormalities. The left ventricular internal cavity size was normal in size. There is moderate left ventricular hypertrophy. Left ventricular diastolic parameters are consistent with Grade I diastolic dysfunction (impaired relaxation). Right Ventricle: The right ventricular size is normal. No increase in right ventricular wall thickness. Right ventricular systolic function is normal. There is normal pulmonary artery systolic pressure. The tricuspid regurgitant velocity is 2.21 m/s, and  with an assumed right atrial pressure of 3 mmHg, the estimated right ventricular systolic pressure is 09.3 mmHg. Left Atrium: Left atrial size was severely dilated. Right Atrium: Right atrial size was normal in size. Pericardium: There is no evidence of pericardial effusion. Mitral Valve: The mitral valve is normal in structure. Mild mitral valve regurgitation. Tricuspid Valve: The tricuspid valve is normal in structure. Tricuspid valve regurgitation is mild . No evidence of tricuspid stenosis. Aortic Valve: The aortic valve is normal in structure. Aortic valve regurgitation is not visualized. Pulmonic Valve: The pulmonic valve was normal in structure. Pulmonic valve regurgitation is not visualized. Aorta: The aortic root and ascending aorta are structurally normal, with no evidence of dilitation. IAS/Shunts: The atrial septum is grossly normal.  LEFT VENTRICLE PLAX 2D LVIDd:         5.10 cm LVIDs:         3.60 cm LV PW:         1.40  cm LV IVS:        1.20 cm LVOT diam:     2.10 cm LV SV:         59 LV SV Index:   30 LVOT Area:     3.46 cm  RIGHT VENTRICLE              IVC RV Basal diam:  2.60 cm     IVC diam: 1.60 cm RV S prime:     13.60 cm/s TAPSE (M-mode): 1.8 cm LEFT ATRIUM             Index       RIGHT ATRIUM           Index LA diam:        4.80 cm 2.48 cm/m  RA Area:     14.30 cm LA Vol (A2C):   91.3 ml 47.27 ml/m RA Volume:   34.60 ml  17.91 ml/m LA Vol (A4C):   79.2 ml 41.00 ml/m LA Biplane Vol: 86.4 ml 44.73 ml/m  AORTIC VALVE LVOT Vmax:   73.37 cm/s LVOT Vmean:  50.933 cm/s LVOT VTI:    0.170 m  AORTA Ao Root diam: 3.20 cm Ao Asc diam:  2.90 cm MITRAL VALVE               TRICUSPID VALVE MV Area (PHT): 2.01 cm    TR Peak grad:   19.5 mmHg MV Decel Time: 377 msec    TR Vmax:        221.00 cm/s MV E velocity: 58.10 cm/s MV A velocity: 98.50 cm/s  SHUNTS MV E/A ratio:  0.59        Systemic VTI:  0.17 m                            Systemic Diam: 2.10 cm Mertie Moores MD Electronically signed by Mertie Moores MD Signature Date/Time: 02/16/2021/2:54:54 PM    Final     Assessment/Plan  1. Intractable nausea and vomiting -   Possibly exacerbated by erosive gastritis versus bowel edema from fluid overload - metoCLOPramide (REGLAN) 5 MG tablet; Take 1 tablet (5 mg total) by mouth 3 (three) times daily.  Dispense: 90 tablet; Refill: 0 - pantoprazole (PROTONIX) 40 MG tablet; Take 1 tablet (40 mg total) by mouth 2 (two) times daily before a meal for 60 days, THEN 1 tablet (40 mg total) daily.  Dispense: 60 tablet; Refill: 0  2. Hypothyroidism, unspecified type Lab Results  Component Value Date   TSH 10.814 (H) 02/15/2021   - levothyroxine (SYNTHROID) 200 MCG tablet; Take 1 tablet (200 mcg total) by mouth daily at 6 (six) AM.  Dispense: 30 tablet; Refill: 0  3. Acute on chronic systolic CHF (congestive heart failure), NYHA class 3 (HCC) - carvedilol (COREG) 12.5 MG tablet; Take 1 tablet (12.5 mg total) by mouth 2 (two) times daily.  Dispense: 60 tablet; Refill: 0 - metolazone (ZAROXOLYN) 5 MG tablet; Take 1 tablet (5 mg total) by mouth 3 (three)  times a week. Takes 5mg  on Monday, Wednesday and Friday  Dispense: 13 tablet; Refill: 0 - Potassium Chloride ER 20 MEQ TBCR; Take 1 tablet by mouth 3 (three) times a week. Tuesday, Thursdays and Saturdays  Dispense: 13 tablet; Refill: 0 - torsemide (DEMADEX) 100 MG tablet; Take 1 tablet (100 mg total) by mouth 2 (two) times daily.  Dispense: 60 tablet; Refill: 0  4. 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  Dispense: 30 tablet; Refill: 0  5. CKD (chronic kidney disease), stage IV [facility]) Lab Results  Component Value Date   NA 138 02/20/2021   K 4.1 02/20/2021   CO2 24 02/20/2021   GLUCOSE 91 02/20/2021   BUN 118 (H) 02/20/2021   CREATININE 2.22 (H) 02/20/2021   CALCIUM 9.8 02/20/2021   GFRNONAA 23 (L) 02/20/2021   GFRAA 15 (L) 04/22/2020   - cinacalcet (SENSIPAR) 30 MG tablet; Take 1 tablet (30 mg total) by mouth daily.  Dispense: 30 tablet; Refill: 0  6. Type 2 diabetes mellitus with stage 4 chronic kidney disease, with long-term current use of insulin (HCC) Lab Results  Component Value Date   HGBA1C 6.8 (H) 83/72/9021   - TRULICITY 1.5 JD/5.5MC SOPN; Inject 1.5 mg into the skin once a week. Monday  Dispense: 2 mL; Refill: 0    I have filled out patient's discharge paperwork and e-prescribed medications.  Patient will receive home health PT, OT and Aide.  DME provided:  None  Total discharge time: Greater than 30 minutes Greater than 50% was spent in counseling and coordination of care.    Discharge time involved coordination of the discharge process with social worker, nursing staff and therapy department. Medical justification for home health services verified.   Durenda Age, DNP, MSN, FNP-BC [facility] [phone] (Monday-Friday 8:00 a.m. - 5:00 p.m.) [phone] (after hours)

## 2021-03-05 ENCOUNTER — Other Ambulatory Visit: Payer: Self-pay | Admitting: Adult Health

## 2021-03-05 DIAGNOSIS — R112 Nausea with vomiting, unspecified: Secondary | ICD-10-CM

## 2021-03-05 MED ORDER — TORSEMIDE 100 MG PO TABS: 100.0000 mg | ORAL_TABLET | Freq: Two times a day (BID) | ORAL | 0 refills | Status: DC

## 2021-03-05 MED ORDER — POTASSIUM CHLORIDE ER 20 MEQ PO TBCR
1.0000 | EXTENDED_RELEASE_TABLET | ORAL | 0 refills | Status: DC
Start: 2021-03-06 — End: 2021-12-08

## 2021-03-05 MED ORDER — METOCLOPRAMIDE HCL 5 MG PO TABS: 5.0000 mg | ORAL_TABLET | Freq: Three times a day (TID) | ORAL | 0 refills | Status: DC

## 2021-03-05 MED ORDER — OXYCODONE-ACETAMINOPHEN 5-325 MG PO TABS: 1.0000 | ORAL_TABLET | Freq: Four times a day (QID) | ORAL | 0 refills | Status: DC | PRN

## 2021-03-05 MED ORDER — ALLOPURINOL 100 MG PO TABS: 100.0000 mg | ORAL_TABLET | Freq: Every day | ORAL | 0 refills | Status: DC

## 2021-03-05 MED ORDER — TRULICITY 1.5 MG/0.5ML ~~LOC~~ SOAJ: 1.5000 mg | SUBCUTANEOUS | 0 refills | Status: DC

## 2021-03-05 MED ORDER — PANTOPRAZOLE SODIUM 40 MG PO TBEC: DELAYED_RELEASE_TABLET | ORAL | 0 refills | Status: DC

## 2021-03-05 MED ORDER — CLOPIDOGREL BISULFATE 75 MG PO TABS: 1.0000 | ORAL_TABLET | Freq: Every day | ORAL | 0 refills | Status: DC

## 2021-03-05 MED ORDER — METOLAZONE 5 MG PO TABS: 5.0000 mg | ORAL_TABLET | ORAL | 0 refills | Status: DC

## 2021-03-05 MED ORDER — CARVEDILOL 12.5 MG PO TABS: 12.5000 mg | ORAL_TABLET | Freq: Two times a day (BID) | ORAL | 0 refills | Status: DC

## 2021-03-05 MED ORDER — LEVOTHYROXINE SODIUM 200 MCG PO TABS
200.0000 ug | ORAL_TABLET | Freq: Every day | ORAL | 0 refills | Status: DC
Start: 2021-03-05 — End: 2023-04-15

## 2021-03-05 MED ORDER — CINACALCET HCL 30 MG PO TABS
30.0000 mg | ORAL_TABLET | Freq: Every day | ORAL | 0 refills | Status: DC
Start: 2021-03-05 — End: 2022-01-31

## 2021-03-05 MED ORDER — HYDROXYZINE HCL 25 MG PO TABS: 25.0000 mg | ORAL_TABLET | Freq: Two times a day (BID) | ORAL | 0 refills | Status: DC | PRN

## 2021-03-07 ENCOUNTER — Encounter (HOSPITAL_COMMUNITY): Payer: Self-pay

## 2021-03-14 ENCOUNTER — Encounter (HOSPITAL_COMMUNITY): Payer: Self-pay

## 2021-03-15 ENCOUNTER — Encounter (HOSPITAL_COMMUNITY): Payer: Self-pay

## 2021-03-21 ENCOUNTER — Other Ambulatory Visit: Payer: Self-pay

## 2021-03-21 ENCOUNTER — Encounter (HOSPITAL_COMMUNITY)
Admission: RE | Admit: 2021-03-21 | Discharge: 2021-03-21 | Disposition: A | Discharge status: Home / Self Care | Payer: 59 | Source: Ambulatory Visit | Attending: Internal Medicine | Admitting: Internal Medicine

## 2021-03-21 VITALS — BP 144/67 | HR 81 | Temp 97.3°F | Resp 20

## 2021-03-21 DIAGNOSIS — N185 Chronic kidney disease, stage 5: Secondary | ICD-10-CM | POA: Diagnosis present

## 2021-03-21 DIAGNOSIS — N184 Chronic kidney disease, stage 4 (severe): Secondary | ICD-10-CM

## 2021-03-21 DIAGNOSIS — N179 Acute kidney failure, unspecified: Secondary | ICD-10-CM | POA: Diagnosis present

## 2021-03-21 LAB — RENAL FUNCTION PANEL
Albumin: 2.9 g/dL — ABNORMAL LOW (ref 3.5–5.0)
Anion gap: 12 (ref 5–15)
BUN: 133 mg/dL — ABNORMAL HIGH (ref 8–23)
CO2: 32 mmol/L (ref 22–32)
Calcium: 9.2 mg/dL (ref 8.9–10.3)
Chloride: 91 mmol/L — ABNORMAL LOW (ref 98–111)
Creatinine, Ser: 2.89 mg/dL — ABNORMAL HIGH (ref 0.44–1.00)
GFR, Estimated: 17 mL/min — ABNORMAL LOW (ref >60)
Glucose, Bld: 180 mg/dL — ABNORMAL HIGH (ref 70–99)
Phosphorus: 3.8 mg/dL (ref 2.5–4.6)
Potassium: 3.8 mmol/L (ref 3.5–5.1)
Sodium: 135 mmol/L (ref 135–145)

## 2021-03-21 LAB — POCT HEMOGLOBIN-HEMACUE: Hemoglobin: 8 g/dL — ABNORMAL LOW (ref 12.0–15.0)

## 2021-03-21 MED ORDER — EPOETIN ALFA-EPBX 40000 UNIT/ML IJ SOLN
INTRAMUSCULAR | Status: AC
  Administered 2021-03-21: 60000 [IU] via SUBCUTANEOUS
  Filled 2021-03-21: qty 1

## 2021-03-21 MED ORDER — EPOETIN ALFA-EPBX 40000 UNIT/ML IJ SOLN: 60000.0000 [IU] | INTRAMUSCULAR | Status: DC

## 2021-03-21 MED ORDER — EPOETIN ALFA-EPBX 10000 UNIT/ML IJ SOLN
INTRAMUSCULAR | Status: AC
  Filled 2021-03-21: qty 2

## 2021-03-21 MED ORDER — SODIUM CHLORIDE 0.9 % IV SOLN
510.0000 mg | INTRAVENOUS | Status: DC
  Administered 2021-03-21: 510 mg via INTRAVENOUS
  Filled 2021-03-21: qty 510

## 2021-03-22 ENCOUNTER — Encounter (HOSPITAL_COMMUNITY): Payer: Medicare Other

## 2021-03-22 LAB — PTH, INTACT AND CALCIUM
Calcium, Total (PTH): 9.3 mg/dL (ref 8.7–10.3)
PTH: 32 pg/mL (ref 15–65)

## 2021-03-28 ENCOUNTER — Other Ambulatory Visit: Payer: Self-pay

## 2021-03-28 ENCOUNTER — Encounter (HOSPITAL_COMMUNITY)
Admission: RE | Admit: 2021-03-28 | Discharge: 2021-03-28 | Disposition: A | Discharge status: Home / Self Care | Payer: 59 | Source: Ambulatory Visit | Attending: Internal Medicine | Admitting: Internal Medicine

## 2021-03-28 VITALS — BP 134/67 | HR 84 | Temp 97.4°F | Resp 20

## 2021-03-28 DIAGNOSIS — N179 Acute kidney failure, unspecified: Secondary | ICD-10-CM

## 2021-03-28 DIAGNOSIS — N184 Chronic kidney disease, stage 4 (severe): Secondary | ICD-10-CM

## 2021-03-28 LAB — POCT HEMOGLOBIN-HEMACUE: Hemoglobin: 8.1 g/dL — ABNORMAL LOW (ref 12.0–15.0)

## 2021-03-28 LAB — GLUCOSE, CAPILLARY: Glucose-Capillary: 208 mg/dL — ABNORMAL HIGH (ref 70–99)

## 2021-03-28 MED ORDER — SODIUM CHLORIDE 0.9 % IV SOLN
510.0000 mg | INTRAVENOUS | Status: DC
  Administered 2021-03-28: 510 mg via INTRAVENOUS
  Filled 2021-03-28: qty 510

## 2021-03-28 MED ORDER — EPOETIN ALFA-EPBX 40000 UNIT/ML IJ SOLN
INTRAMUSCULAR | Status: AC
  Filled 2021-03-28: qty 2

## 2021-03-28 MED ORDER — EPOETIN ALFA-EPBX 40000 UNIT/ML IJ SOLN
60000.0000 [IU] | INTRAMUSCULAR | Status: DC
  Administered 2021-03-28: 60000 [IU] via SUBCUTANEOUS

## 2021-04-04 ENCOUNTER — Encounter (HOSPITAL_COMMUNITY)
Admission: RE | Admit: 2021-04-04 | Discharge: 2021-04-04 | Disposition: A | Discharge status: Home / Self Care | Payer: 59 | Source: Ambulatory Visit | Attending: Internal Medicine | Admitting: Internal Medicine

## 2021-04-04 VITALS — BP 126/61 | HR 89 | Resp 18

## 2021-04-04 DIAGNOSIS — N179 Acute kidney failure, unspecified: Secondary | ICD-10-CM

## 2021-04-04 DIAGNOSIS — N184 Chronic kidney disease, stage 4 (severe): Secondary | ICD-10-CM

## 2021-04-04 LAB — IRON AND TIBC
Iron: 45 ug/dL (ref 28–170)
Saturation Ratios: 14 % (ref 10.4–31.8)
TIBC: 316 ug/dL (ref 250–450)
UIBC: 271 ug/dL

## 2021-04-04 LAB — POCT HEMOGLOBIN-HEMACUE: Hemoglobin: 10 g/dL — ABNORMAL LOW (ref 12.0–15.0)

## 2021-04-04 LAB — FERRITIN: Ferritin: 375 ng/mL — ABNORMAL HIGH (ref 11–307)

## 2021-04-04 MED ORDER — EPOETIN ALFA-EPBX 40000 UNIT/ML IJ SOLN: 60000.0000 [IU] | INTRAMUSCULAR | Status: DC

## 2021-04-04 MED ORDER — EPOETIN ALFA-EPBX 40000 UNIT/ML IJ SOLN
INTRAMUSCULAR | Status: AC
  Administered 2021-04-04: 60000 [IU] via SUBCUTANEOUS
  Filled 2021-04-04: qty 2

## 2021-04-05 ENCOUNTER — Telehealth: Payer: Self-pay | Admitting: Cardiovascular Disease

## 2021-04-05 NOTE — Telephone Encounter (Signed)
Called patient who states she has lower extremity swelling but denies weight gain. I asked if she has contacted her nephrologist and she denies. States she is a patient of Dr. [name] with [facility]. I advised that per her last visit note with our providers (CH[facility] clinic on 6/21), that management of her diuretics is through nephrology. She states she is not on dialysis at this time. I asked her to call Dr. Caprice Red office and she verbalized understanding and agreement.

## 2021-04-05 NOTE — Telephone Encounter (Signed)
Pt c/o swelling: STAT is pt has developed SOB within 24 hours  If swelling, where is the swelling located? Legs feet   How much weight have you gained and in what time span?   Have you gained 3 pounds in a day or 5 pounds in a week? Yes   Do you have a log of your daily weights (if so, list)? No   Are you currently taking a fluid pill? Torsemide 100 mg   Are you currently SOB? No more than normal   Have you traveled recently? No    Sheralyn Boatman from home health saw patient last week. States patient is very swelling.

## 2021-04-11 ENCOUNTER — Encounter (HOSPITAL_COMMUNITY)
Admission: RE | Admit: 2021-04-11 | Discharge: 2021-04-11 | Disposition: A | Discharge status: Home / Self Care | Payer: 59 | Source: Ambulatory Visit | Attending: Internal Medicine | Admitting: Internal Medicine

## 2021-04-11 VITALS — BP 97/50 | HR 96 | Temp 100.0°F | Resp 18

## 2021-04-11 DIAGNOSIS — N184 Chronic kidney disease, stage 4 (severe): Secondary | ICD-10-CM | POA: Insufficient documentation

## 2021-04-11 DIAGNOSIS — N179 Acute kidney failure, unspecified: Secondary | ICD-10-CM | POA: Diagnosis present

## 2021-04-11 LAB — POCT HEMOGLOBIN-HEMACUE: Hemoglobin: 10.4 g/dL — ABNORMAL LOW (ref 12.0–15.0)

## 2021-04-11 MED ORDER — EPOETIN ALFA-EPBX 40000 UNIT/ML IJ SOLN
INTRAMUSCULAR | Status: AC
  Administered 2021-04-11: 60000 [IU] via SUBCUTANEOUS
  Filled 2021-04-11: qty 2

## 2021-04-11 MED ORDER — EPOETIN ALFA-EPBX 40000 UNIT/ML IJ SOLN: 60000.0000 [IU] | INTRAMUSCULAR | Status: DC

## 2021-04-12 ENCOUNTER — Other Ambulatory Visit: Payer: Self-pay | Admitting: Student

## 2021-04-12 ENCOUNTER — Ambulatory Visit
Admission: RE | Admit: 2021-04-12 | Discharge: 2021-04-12 | Disposition: A | Discharge status: Home / Self Care | Payer: 59 | Source: Ambulatory Visit | Attending: Student | Admitting: Student

## 2021-04-12 ENCOUNTER — Other Ambulatory Visit: Payer: Self-pay

## 2021-04-12 ENCOUNTER — Encounter (HOSPITAL_COMMUNITY): Payer: Self-pay

## 2021-04-12 DIAGNOSIS — M25572 Pain in left ankle and joints of left foot: Secondary | ICD-10-CM

## 2021-04-12 DIAGNOSIS — M79605 Pain in left leg: Secondary | ICD-10-CM

## 2021-04-12 DIAGNOSIS — M79672 Pain in left foot: Secondary | ICD-10-CM

## 2021-04-12 DIAGNOSIS — M79604 Pain in right leg: Secondary | ICD-10-CM

## 2021-04-18 ENCOUNTER — Encounter (HOSPITAL_COMMUNITY)
Admission: RE | Admit: 2021-04-18 | Discharge: 2021-04-18 | Disposition: A | Discharge status: Home / Self Care | Payer: 59 | Source: Ambulatory Visit | Attending: Internal Medicine | Admitting: Internal Medicine

## 2021-04-18 ENCOUNTER — Other Ambulatory Visit: Payer: Self-pay

## 2021-04-18 VITALS — BP 126/70 | HR 89 | Temp 97.5°F | Resp 18

## 2021-04-18 DIAGNOSIS — N184 Chronic kidney disease, stage 4 (severe): Secondary | ICD-10-CM

## 2021-04-18 DIAGNOSIS — N179 Acute kidney failure, unspecified: Secondary | ICD-10-CM

## 2021-04-18 LAB — RENAL FUNCTION PANEL
Albumin: 2.9 g/dL — ABNORMAL LOW (ref 3.5–5.0)
Anion gap: 15 (ref 5–15)
BUN: 152 mg/dL — ABNORMAL HIGH (ref 8–23)
CO2: 31 mmol/L (ref 22–32)
Calcium: 9.4 mg/dL (ref 8.9–10.3)
Chloride: 91 mmol/L — ABNORMAL LOW (ref 98–111)
Creatinine, Ser: 2.92 mg/dL — ABNORMAL HIGH (ref 0.44–1.00)
GFR, Estimated: 17 mL/min — ABNORMAL LOW (ref >60)
Glucose, Bld: 236 mg/dL — ABNORMAL HIGH (ref 70–99)
Phosphorus: 4.1 mg/dL (ref 2.5–4.6)
Potassium: 3.8 mmol/L (ref 3.5–5.1)
Sodium: 137 mmol/L (ref 135–145)

## 2021-04-18 LAB — POCT HEMOGLOBIN-HEMACUE: Hemoglobin: 11.4 g/dL — ABNORMAL LOW (ref 12.0–15.0)

## 2021-04-18 MED ORDER — EPOETIN ALFA-EPBX 40000 UNIT/ML IJ SOLN
INTRAMUSCULAR | Status: AC
  Filled 2021-04-18: qty 2

## 2021-04-18 MED ORDER — EPOETIN ALFA-EPBX 40000 UNIT/ML IJ SOLN
60000.0000 [IU] | INTRAMUSCULAR | Status: DC
  Administered 2021-04-18: 60000 [IU] via SUBCUTANEOUS

## 2021-04-19 LAB — PTH, INTACT AND CALCIUM
Calcium, Total (PTH): 9.6 mg/dL (ref 8.7–10.3)
PTH: 54 pg/mL (ref 15–65)

## 2021-04-20 ENCOUNTER — Telehealth: Payer: Self-pay | Admitting: Cardiovascular Disease

## 2021-04-20 NOTE — Telephone Encounter (Signed)
Pt c/o swelling: STAT is pt has developed SOB within 24 hours  How much weight have you gained and in what time span?  No weight gain  If swelling, where is the swelling located?  Legs and feet  Are you currently taking a fluid pill? Yes, patient takes torsemide (DEMADEX) 100 MG tablet and metolazone (ZAROXOLYN) 5 MG tablet  Are you currently SOB?  No   Do you have a log of your daily weights (if so, list)?  No log available   Have you gained 3 pounds in a day or 5 pounds in a week?  No weight gain   Have you traveled recently?  Patient states she traveled 1-2 ago. The car ride was about 7 hours there and 7 hours back

## 2021-04-20 NOTE — Telephone Encounter (Signed)
Attempted to call patient, left message for patient to call back to office.   

## 2021-04-21 ENCOUNTER — Other Ambulatory Visit: Payer: Self-pay | Admitting: *Deleted

## 2021-04-21 ENCOUNTER — Other Ambulatory Visit: Payer: Self-pay | Admitting: Student

## 2021-04-21 DIAGNOSIS — R609 Edema, unspecified: Secondary | ICD-10-CM

## 2021-04-25 ENCOUNTER — Encounter (HOSPITAL_COMMUNITY)
Admission: RE | Admit: 2021-04-25 | Discharge: 2021-04-25 | Disposition: A | Discharge status: Home / Self Care | Payer: 59 | Source: Ambulatory Visit | Attending: Internal Medicine | Admitting: Internal Medicine

## 2021-04-25 VITALS — BP 133/69 | HR 81

## 2021-04-25 DIAGNOSIS — N179 Acute kidney failure, unspecified: Secondary | ICD-10-CM

## 2021-04-25 DIAGNOSIS — N184 Chronic kidney disease, stage 4 (severe): Secondary | ICD-10-CM

## 2021-04-25 LAB — POCT HEMOGLOBIN-HEMACUE: Hemoglobin: 11.9 g/dL — ABNORMAL LOW (ref 12.0–15.0)

## 2021-04-25 MED ORDER — EPOETIN ALFA-EPBX 40000 UNIT/ML IJ SOLN: 60000.0000 [IU] | INTRAMUSCULAR | Status: DC

## 2021-04-25 MED ORDER — EPOETIN ALFA-EPBX 40000 UNIT/ML IJ SOLN
INTRAMUSCULAR | Status: AC
  Administered 2021-04-25: 60000 [IU] via SUBCUTANEOUS
  Filled 2021-04-25: qty 2

## 2021-04-26 NOTE — Telephone Encounter (Signed)
Left message for pt to call.

## 2021-04-27 ENCOUNTER — Ambulatory Visit
Admission: RE | Admit: 2021-04-27 | Discharge: 2021-04-27 | Disposition: A | Discharge status: Home / Self Care | Payer: 59 | Source: Ambulatory Visit | Attending: Student | Admitting: Student

## 2021-04-27 DIAGNOSIS — R609 Edema, unspecified: Secondary | ICD-10-CM

## 2021-04-27 NOTE — Telephone Encounter (Signed)
Called [id number]

## 2021-05-02 ENCOUNTER — Other Ambulatory Visit: Payer: Self-pay

## 2021-05-02 ENCOUNTER — Encounter (HOSPITAL_COMMUNITY)
Admission: RE | Admit: 2021-05-02 | Discharge: 2021-05-02 | Disposition: A | Discharge status: Home / Self Care | Payer: 59 | Source: Ambulatory Visit | Attending: Internal Medicine | Admitting: Internal Medicine

## 2021-05-02 VITALS — BP 131/61 | HR 85 | Temp 98.4°F | Resp 20

## 2021-05-02 DIAGNOSIS — N179 Acute kidney failure, unspecified: Secondary | ICD-10-CM | POA: Diagnosis not present

## 2021-05-02 DIAGNOSIS — N184 Chronic kidney disease, stage 4 (severe): Secondary | ICD-10-CM

## 2021-05-02 LAB — IRON AND TIBC
Iron: 25 ug/dL — ABNORMAL LOW (ref 28–170)
Saturation Ratios: 7 % — ABNORMAL LOW (ref 10.4–31.8)
TIBC: 371 ug/dL (ref 250–450)
UIBC: 346 ug/dL

## 2021-05-02 LAB — FERRITIN: Ferritin: 32 ng/mL (ref 11–307)

## 2021-05-02 LAB — POCT HEMOGLOBIN-HEMACUE: Hemoglobin: 12.2 g/dL (ref 12.0–15.0)

## 2021-05-02 MED ORDER — EPOETIN ALFA-EPBX 40000 UNIT/ML IJ SOLN: 60000.0000 [IU] | INTRAMUSCULAR | Status: DC

## 2021-05-09 ENCOUNTER — Encounter (HOSPITAL_COMMUNITY): Payer: 59

## 2021-05-16 ENCOUNTER — Encounter (HOSPITAL_COMMUNITY)
Admission: RE | Admit: 2021-05-16 | Discharge: 2021-05-16 | Disposition: A | Discharge status: Home / Self Care | Payer: 59 | Source: Ambulatory Visit | Attending: Internal Medicine | Admitting: Internal Medicine

## 2021-05-16 ENCOUNTER — Other Ambulatory Visit: Payer: Self-pay

## 2021-05-16 VITALS — BP 127/64 | HR 84 | Temp 98.2°F | Resp 20

## 2021-05-16 DIAGNOSIS — N179 Acute kidney failure, unspecified: Secondary | ICD-10-CM | POA: Insufficient documentation

## 2021-05-16 DIAGNOSIS — N184 Chronic kidney disease, stage 4 (severe): Secondary | ICD-10-CM | POA: Insufficient documentation

## 2021-05-16 DIAGNOSIS — D631 Anemia in chronic kidney disease: Secondary | ICD-10-CM | POA: Insufficient documentation

## 2021-05-16 LAB — RENAL FUNCTION PANEL
Albumin: 3.1 g/dL — ABNORMAL LOW (ref 3.5–5.0)
Anion gap: 17 — ABNORMAL HIGH (ref 5–15)
BUN: 246 mg/dL — ABNORMAL HIGH (ref 8–23)
CO2: 30 mmol/L (ref 22–32)
Calcium: 9.7 mg/dL (ref 8.9–10.3)
Chloride: 86 mmol/L — ABNORMAL LOW (ref 98–111)
Creatinine, Ser: 3.33 mg/dL — ABNORMAL HIGH (ref 0.44–1.00)
GFR, Estimated: 14 mL/min — ABNORMAL LOW (ref >60)
Glucose, Bld: 235 mg/dL — ABNORMAL HIGH (ref 70–99)
Phosphorus: 5.9 mg/dL — ABNORMAL HIGH (ref 2.5–4.6)
Potassium: 3.6 mmol/L (ref 3.5–5.1)
Sodium: 133 mmol/L — ABNORMAL LOW (ref 135–145)

## 2021-05-16 LAB — POCT HEMOGLOBIN-HEMACUE: Hemoglobin: 10.8 g/dL — ABNORMAL LOW (ref 12.0–15.0)

## 2021-05-16 MED ORDER — EPOETIN ALFA-EPBX 40000 UNIT/ML IJ SOLN
60000.0000 [IU] | INTRAMUSCULAR | Status: DC
  Administered 2021-05-16: 60000 [IU] via SUBCUTANEOUS

## 2021-05-16 MED ORDER — EPOETIN ALFA-EPBX 40000 UNIT/ML IJ SOLN
INTRAMUSCULAR | Status: AC
  Filled 2021-05-16: qty 2

## 2021-05-17 LAB — PTH, INTACT AND CALCIUM
Calcium, Total (PTH): 9.4 mg/dL (ref 8.7–10.3)
PTH: 89 pg/mL — ABNORMAL HIGH (ref 15–65)

## 2021-05-30 ENCOUNTER — Other Ambulatory Visit: Payer: Self-pay

## 2021-05-30 ENCOUNTER — Encounter (HOSPITAL_COMMUNITY)
Admission: RE | Admit: 2021-05-30 | Discharge: 2021-05-30 | Disposition: A | Discharge status: Home / Self Care | Payer: 59 | Source: Ambulatory Visit | Attending: Internal Medicine | Admitting: Internal Medicine

## 2021-05-30 VITALS — BP 114/70 | HR 83 | Temp 98.5°F | Resp 20

## 2021-05-30 DIAGNOSIS — N179 Acute kidney failure, unspecified: Secondary | ICD-10-CM | POA: Diagnosis not present

## 2021-05-30 DIAGNOSIS — N184 Chronic kidney disease, stage 4 (severe): Secondary | ICD-10-CM

## 2021-05-30 LAB — IRON AND TIBC
Iron: 22 ug/dL — ABNORMAL LOW (ref 28–170)
Saturation Ratios: 5 % — ABNORMAL LOW (ref 10.4–31.8)
TIBC: 407 ug/dL (ref 250–450)
UIBC: 385 ug/dL

## 2021-05-30 LAB — FERRITIN: Ferritin: 15 ng/mL (ref 11–307)

## 2021-05-30 MED ORDER — SODIUM CHLORIDE 0.9 % IV SOLN
510.0000 mg | Freq: Once | INTRAVENOUS | Status: AC
  Administered 2021-05-30: 510 mg via INTRAVENOUS
  Filled 2021-05-30: qty 510

## 2021-05-30 MED ORDER — EPOETIN ALFA-EPBX 40000 UNIT/ML IJ SOLN
INTRAMUSCULAR | Status: AC
  Administered 2021-05-30: 60000 [IU] via SUBCUTANEOUS
  Filled 2021-05-30: qty 2

## 2021-05-30 MED ORDER — EPOETIN ALFA-EPBX 40000 UNIT/ML IJ SOLN: 60000.0000 [IU] | INTRAMUSCULAR | Status: DC

## 2021-05-30 NOTE — Progress Notes (Signed)
Hemocue 2 weeks ago, 10.8. Received 60,000 units subcu Retacrit. Hemocue today 7.3, checked twice. Pt reports blood in stool. No acute distress noted. Customer service manager with Dr. [name]. Per Dr. [name], give Retacrit as ordered and infuse one dose IV Feraheme now which was done. Patient to see GI MD soon per Dr. Caprice Red office as she recently had positive hemoccult as well. Patient verbalized understanding to go to ED with new/worsening bleeding as patient reported rectal bleeding stopped a few days ago.

## 2021-05-31 LAB — POCT HEMOGLOBIN-HEMACUE: Hemoglobin: 7.3 g/dL — ABNORMAL LOW (ref 12.0–15.0)

## 2021-06-13 ENCOUNTER — Encounter (HOSPITAL_COMMUNITY)
Admission: RE | Admit: 2021-06-13 | Discharge: 2021-06-13 | Disposition: A | Discharge status: Home / Self Care | Payer: 59 | Source: Ambulatory Visit | Attending: Internal Medicine | Admitting: Internal Medicine

## 2021-06-13 ENCOUNTER — Other Ambulatory Visit: Payer: Self-pay

## 2021-06-13 VITALS — BP 140/63 | HR 95 | Temp 98.5°F | Resp 17

## 2021-06-13 DIAGNOSIS — N179 Acute kidney failure, unspecified: Secondary | ICD-10-CM | POA: Insufficient documentation

## 2021-06-13 DIAGNOSIS — D631 Anemia in chronic kidney disease: Secondary | ICD-10-CM | POA: Diagnosis not present

## 2021-06-13 DIAGNOSIS — N184 Chronic kidney disease, stage 4 (severe): Secondary | ICD-10-CM | POA: Diagnosis present

## 2021-06-13 LAB — RENAL FUNCTION PANEL
Albumin: 3 g/dL — ABNORMAL LOW (ref 3.5–5.0)
Anion gap: 12 (ref 5–15)
BUN: 174 mg/dL — ABNORMAL HIGH (ref 8–23)
CO2: 30 mmol/L (ref 22–32)
Calcium: 9.8 mg/dL (ref 8.9–10.3)
Chloride: 95 mmol/L — ABNORMAL LOW (ref 98–111)
Creatinine, Ser: 2.78 mg/dL — ABNORMAL HIGH (ref 0.44–1.00)
GFR, Estimated: 18 mL/min — ABNORMAL LOW (ref >60)
Glucose, Bld: 142 mg/dL — ABNORMAL HIGH (ref 70–99)
Phosphorus: 4.5 mg/dL (ref 2.5–4.6)
Potassium: 3.4 mmol/L — ABNORMAL LOW (ref 3.5–5.1)
Sodium: 137 mmol/L (ref 135–145)

## 2021-06-13 MED ORDER — EPOETIN ALFA-EPBX 40000 UNIT/ML IJ SOLN
INTRAMUSCULAR | Status: AC
  Administered 2021-06-13: 60000 [IU]
  Filled 2021-06-13: qty 2

## 2021-06-13 MED ORDER — EPOETIN ALFA-EPBX 40000 UNIT/ML IJ SOLN: 60000.0000 [IU] | INTRAMUSCULAR | Status: DC

## 2021-06-13 NOTE — Progress Notes (Signed)
[name] notified of client's Hgb being 6.8 and per Dr Caprice Red office client to call GI doctor's office today, client notified and voiced understanding

## 2021-06-14 LAB — PTH, INTACT AND CALCIUM
Calcium, Total (PTH): 9.9 mg/dL (ref 8.7–10.3)
PTH: 98 pg/mL — ABNORMAL HIGH (ref 15–65)

## 2021-06-14 LAB — POCT HEMOGLOBIN-HEMACUE: Hemoglobin: 6.8 g/dL — CL (ref 12.0–15.0)

## 2021-06-27 ENCOUNTER — Inpatient Hospital Stay (HOSPITAL_COMMUNITY): Admission: RE | Admit: 2021-06-27 | Payer: 59 | Source: Ambulatory Visit

## 2021-07-01 ENCOUNTER — Inpatient Hospital Stay (HOSPITAL_COMMUNITY)
Admission: EM | Admit: 2021-07-01 | Discharge: 2021-07-11 | DRG: 871 | Disposition: A | Discharge status: Skilled Nursing Facility | Payer: 59 | Attending: Internal Medicine | Admitting: Internal Medicine

## 2021-07-01 ENCOUNTER — Encounter: Payer: Self-pay | Admitting: Internal Medicine

## 2021-07-01 ENCOUNTER — Other Ambulatory Visit: Payer: Self-pay

## 2021-07-01 ENCOUNTER — Encounter (HOSPITAL_COMMUNITY): Payer: Self-pay | Admitting: Internal Medicine

## 2021-07-01 ENCOUNTER — Observation Stay (HOSPITAL_BASED_OUTPATIENT_CLINIC_OR_DEPARTMENT_OTHER): Payer: 59

## 2021-07-01 ENCOUNTER — Emergency Department (HOSPITAL_COMMUNITY): Payer: 59

## 2021-07-01 DIAGNOSIS — Z7902 Long term (current) use of antithrombotics/antiplatelets: Secondary | ICD-10-CM

## 2021-07-01 DIAGNOSIS — E1151 Type 2 diabetes mellitus with diabetic peripheral angiopathy without gangrene: Secondary | ICD-10-CM | POA: Diagnosis present

## 2021-07-01 DIAGNOSIS — I5032 Chronic diastolic (congestive) heart failure: Secondary | ICD-10-CM | POA: Insufficient documentation

## 2021-07-01 DIAGNOSIS — E66812 Obesity, class 2: Secondary | ICD-10-CM

## 2021-07-01 DIAGNOSIS — G894 Chronic pain syndrome: Secondary | ICD-10-CM | POA: Diagnosis present

## 2021-07-01 DIAGNOSIS — I739 Peripheral vascular disease, unspecified: Secondary | ICD-10-CM | POA: Diagnosis present

## 2021-07-01 DIAGNOSIS — E785 Hyperlipidemia, unspecified: Secondary | ICD-10-CM | POA: Diagnosis present

## 2021-07-01 DIAGNOSIS — D500 Iron deficiency anemia secondary to blood loss (chronic): Secondary | ICD-10-CM | POA: Diagnosis present

## 2021-07-01 DIAGNOSIS — A419 Sepsis, unspecified organism: Secondary | ICD-10-CM | POA: Diagnosis not present

## 2021-07-01 DIAGNOSIS — Z6836 Body mass index (BMI) 36.0-36.9, adult: Secondary | ICD-10-CM

## 2021-07-01 DIAGNOSIS — R609 Edema, unspecified: Secondary | ICD-10-CM | POA: Diagnosis not present

## 2021-07-01 DIAGNOSIS — N184 Chronic kidney disease, stage 4 (severe): Secondary | ICD-10-CM | POA: Diagnosis present

## 2021-07-01 DIAGNOSIS — Z8616 Personal history of COVID-19: Secondary | ICD-10-CM

## 2021-07-01 DIAGNOSIS — I130 Hypertensive heart and chronic kidney disease with heart failure and stage 1 through stage 4 chronic kidney disease, or unspecified chronic kidney disease: Secondary | ICD-10-CM | POA: Diagnosis present

## 2021-07-01 DIAGNOSIS — J441 Chronic obstructive pulmonary disease with (acute) exacerbation: Secondary | ICD-10-CM | POA: Insufficient documentation

## 2021-07-01 DIAGNOSIS — M109 Gout, unspecified: Secondary | ICD-10-CM | POA: Diagnosis present

## 2021-07-01 DIAGNOSIS — E1165 Type 2 diabetes mellitus with hyperglycemia: Secondary | ICD-10-CM | POA: Diagnosis present

## 2021-07-01 DIAGNOSIS — L0390 Cellulitis, unspecified: Secondary | ICD-10-CM | POA: Diagnosis present

## 2021-07-01 DIAGNOSIS — J449 Chronic obstructive pulmonary disease, unspecified: Secondary | ICD-10-CM | POA: Insufficient documentation

## 2021-07-01 DIAGNOSIS — K5521 Angiodysplasia of colon with hemorrhage: Secondary | ICD-10-CM | POA: Diagnosis present

## 2021-07-01 DIAGNOSIS — E1122 Type 2 diabetes mellitus with diabetic chronic kidney disease: Secondary | ICD-10-CM | POA: Diagnosis present

## 2021-07-01 DIAGNOSIS — L03116 Cellulitis of left lower limb: Secondary | ICD-10-CM | POA: Diagnosis present

## 2021-07-01 DIAGNOSIS — Z794 Long term (current) use of insulin: Secondary | ICD-10-CM

## 2021-07-01 DIAGNOSIS — Z8673 Personal history of transient ischemic attack (TIA), and cerebral infarction without residual deficits: Secondary | ICD-10-CM

## 2021-07-01 DIAGNOSIS — D631 Anemia in chronic kidney disease: Secondary | ICD-10-CM | POA: Diagnosis present

## 2021-07-01 DIAGNOSIS — Z8249 Family history of ischemic heart disease and other diseases of the circulatory system: Secondary | ICD-10-CM

## 2021-07-01 DIAGNOSIS — F17210 Nicotine dependence, cigarettes, uncomplicated: Secondary | ICD-10-CM | POA: Diagnosis present

## 2021-07-01 DIAGNOSIS — Z833 Family history of diabetes mellitus: Secondary | ICD-10-CM

## 2021-07-01 DIAGNOSIS — E890 Postprocedural hypothyroidism: Secondary | ICD-10-CM | POA: Diagnosis present

## 2021-07-01 DIAGNOSIS — K219 Gastro-esophageal reflux disease without esophagitis: Secondary | ICD-10-CM | POA: Diagnosis present

## 2021-07-01 DIAGNOSIS — G4733 Obstructive sleep apnea (adult) (pediatric): Secondary | ICD-10-CM | POA: Diagnosis present

## 2021-07-01 DIAGNOSIS — E119 Type 2 diabetes mellitus without complications: Secondary | ICD-10-CM

## 2021-07-01 DIAGNOSIS — R062 Wheezing: Secondary | ICD-10-CM

## 2021-07-01 DIAGNOSIS — Z90710 Acquired absence of both cervix and uterus: Secondary | ICD-10-CM

## 2021-07-01 DIAGNOSIS — I5043 Acute on chronic combined systolic (congestive) and diastolic (congestive) heart failure: Secondary | ICD-10-CM | POA: Diagnosis present

## 2021-07-01 DIAGNOSIS — E039 Hypothyroidism, unspecified: Secondary | ICD-10-CM | POA: Diagnosis present

## 2021-07-01 DIAGNOSIS — E876 Hypokalemia: Secondary | ICD-10-CM | POA: Diagnosis present

## 2021-07-01 DIAGNOSIS — Z79890 Hormone replacement therapy (postmenopausal): Secondary | ICD-10-CM

## 2021-07-01 DIAGNOSIS — R0602 Shortness of breath: Secondary | ICD-10-CM

## 2021-07-01 DIAGNOSIS — L03115 Cellulitis of right lower limb: Secondary | ICD-10-CM | POA: Diagnosis present

## 2021-07-01 DIAGNOSIS — E6609 Other obesity due to excess calories: Secondary | ICD-10-CM

## 2021-07-01 DIAGNOSIS — Z79899 Other long term (current) drug therapy: Secondary | ICD-10-CM

## 2021-07-01 HISTORY — DX: Other obesity due to excess calories: E66.09

## 2021-07-01 HISTORY — DX: Body mass index (BMI) 36.0-36.9, adult: Z68.36

## 2021-07-01 HISTORY — DX: Obesity, class 2: E66.812

## 2021-07-01 HISTORY — DX: Chronic diastolic (congestive) heart failure: I50.32

## 2021-07-01 LAB — CBC WITH DIFFERENTIAL/PLATELET
Abs Immature Granulocytes: 0.59 10*3/uL — ABNORMAL HIGH (ref 0.00–0.07)
Basophils Absolute: 0.1 10*3/uL (ref 0.0–0.1)
Basophils Relative: 0 %
Eosinophils Absolute: 0 10*3/uL (ref 0.0–0.5)
Eosinophils Relative: 0 %
HCT: 24 % — ABNORMAL LOW (ref 36.0–46.0)
Hemoglobin: 7 g/dL — ABNORMAL LOW (ref 12.0–15.0)
Immature Granulocytes: 2 %
Lymphocytes Relative: 3 %
Lymphs Abs: 1.1 10*3/uL (ref 0.7–4.0)
MCH: 23.1 pg — ABNORMAL LOW (ref 26.0–34.0)
MCHC: 29.2 g/dL — ABNORMAL LOW (ref 30.0–36.0)
MCV: 79.2 fL — ABNORMAL LOW (ref 80.0–100.0)
Monocytes Absolute: 0.6 10*3/uL (ref 0.1–1.0)
Monocytes Relative: 2 %
Neutro Abs: 33.5 10*3/uL — ABNORMAL HIGH (ref 1.7–7.7)
Neutrophils Relative %: 93 %
Platelets: 321 10*3/uL (ref 150–400)
RBC: 3.03 MIL/uL — ABNORMAL LOW (ref 3.87–5.11)
RDW: 21.9 % — ABNORMAL HIGH (ref 11.5–15.5)
WBC: 35.9 10*3/uL — ABNORMAL HIGH (ref 4.0–10.5)
nRBC: 0 % (ref 0.0–0.2)

## 2021-07-01 LAB — TSH: TSH: 2.69 u[IU]/mL (ref 0.350–4.500)

## 2021-07-01 LAB — SARS CORONAVIRUS 2 (TAT 6-24 HRS): SARS Coronavirus 2: NEGATIVE

## 2021-07-01 LAB — COMPREHENSIVE METABOLIC PANEL
ALT: 14 U/L (ref 0–44)
AST: 22 U/L (ref 15–41)
Albumin: 3 g/dL — ABNORMAL LOW (ref 3.5–5.0)
Alkaline Phosphatase: 70 U/L (ref 38–126)
Anion gap: 14 (ref 5–15)
BUN: 160 mg/dL — ABNORMAL HIGH (ref 8–23)
CO2: 29 mmol/L (ref 22–32)
Calcium: 9.2 mg/dL (ref 8.9–10.3)
Chloride: 92 mmol/L — ABNORMAL LOW (ref 98–111)
Creatinine, Ser: 2.9 mg/dL — ABNORMAL HIGH (ref 0.44–1.00)
GFR, Estimated: 17 mL/min — ABNORMAL LOW (ref >60)
Glucose, Bld: 145 mg/dL — ABNORMAL HIGH (ref 70–99)
Potassium: 3.5 mmol/L (ref 3.5–5.1)
Sodium: 135 mmol/L (ref 135–145)
Total Bilirubin: 0.6 mg/dL (ref 0.3–1.2)
Total Protein: 6.6 g/dL (ref 6.5–8.1)

## 2021-07-01 LAB — PROCALCITONIN: Procalcitonin: 3 ng/mL

## 2021-07-01 LAB — CK: Total CK: 70 U/L (ref 38–234)

## 2021-07-01 LAB — CBG MONITORING, ED: Glucose-Capillary: 166 mg/dL — ABNORMAL HIGH (ref 70–99)

## 2021-07-01 LAB — GLUCOSE, CAPILLARY
Glucose-Capillary: 198 mg/dL — ABNORMAL HIGH (ref 70–99)
Glucose-Capillary: 158 mg/dL — ABNORMAL HIGH (ref 70–99)

## 2021-07-01 LAB — PROTIME-INR
INR: 1 (ref 0.8–1.2)
Prothrombin Time: 13.3 seconds (ref 11.4–15.2)

## 2021-07-01 LAB — APTT: aPTT: 26 seconds (ref 24–36)

## 2021-07-01 LAB — LACTIC ACID, PLASMA: Lactic Acid, Venous: 1.5 mmol/L (ref 0.5–1.9)

## 2021-07-01 MED ORDER — METOCLOPRAMIDE HCL 5 MG PO TABS
5.0000 mg | ORAL_TABLET | Freq: Three times a day (TID) | ORAL | Status: DC
  Administered 2021-07-01 – 2021-07-10 (×29): 5 mg via ORAL
  Filled 2021-07-01 (×29): qty 1

## 2021-07-01 MED ORDER — CINACALCET HCL 30 MG PO TABS
30.0000 mg | ORAL_TABLET | Freq: Every day | ORAL | Status: DC
  Administered 2021-07-01 – 2021-07-10 (×10): 30 mg via ORAL
  Filled 2021-07-01 (×14): qty 1

## 2021-07-01 MED ORDER — CARVEDILOL 12.5 MG PO TABS
12.5000 mg | ORAL_TABLET | Freq: Two times a day (BID) | ORAL | Status: DC
  Administered 2021-07-02 – 2021-07-10 (×18): 12.5 mg via ORAL
  Filled 2021-07-01 (×19): qty 1

## 2021-07-01 MED ORDER — INSULIN ASPART 100 UNIT/ML IJ SOLN
0.0000 [IU] | Freq: Three times a day (TID) | INTRAMUSCULAR | Status: DC
  Administered 2021-07-01: 2 [IU] via SUBCUTANEOUS
  Administered 2021-07-02: 1 [IU] via SUBCUTANEOUS
  Administered 2021-07-02 – 2021-07-03 (×5): 2 [IU] via SUBCUTANEOUS
  Administered 2021-07-04: 1 [IU] via SUBCUTANEOUS
  Administered 2021-07-04: 3 [IU] via SUBCUTANEOUS
  Administered 2021-07-04 – 2021-07-05 (×3): 1 [IU] via SUBCUTANEOUS
  Administered 2021-07-05: 2 [IU] via SUBCUTANEOUS
  Administered 2021-07-06: 3 [IU] via SUBCUTANEOUS
  Administered 2021-07-06: 2 [IU] via SUBCUTANEOUS
  Administered 2021-07-06: 3 [IU] via SUBCUTANEOUS
  Administered 2021-07-07: 2 [IU] via SUBCUTANEOUS

## 2021-07-01 MED ORDER — ZOLPIDEM TARTRATE 5 MG PO TABS: 5.0000 mg | ORAL_TABLET | Freq: Every evening | ORAL | Status: DC | PRN

## 2021-07-01 MED ORDER — HYDROXYZINE HCL 25 MG PO TABS
25.0000 mg | ORAL_TABLET | Freq: Three times a day (TID) | ORAL | Status: DC | PRN
  Administered 2021-07-02 – 2021-07-03 (×2): 25 mg via ORAL
  Filled 2021-07-01 (×4): qty 1

## 2021-07-01 MED ORDER — CLOPIDOGREL BISULFATE 75 MG PO TABS
75.0000 mg | ORAL_TABLET | Freq: Every day | ORAL | Status: DC
  Administered 2021-07-01 – 2021-07-06 (×6): 75 mg via ORAL
  Filled 2021-07-01 (×6): qty 1

## 2021-07-01 MED ORDER — ACETAMINOPHEN 325 MG PO TABS
650.0000 mg | ORAL_TABLET | Freq: Four times a day (QID) | ORAL | Status: DC | PRN
  Administered 2021-07-01 – 2021-07-09 (×7): 650 mg via ORAL
  Filled 2021-07-01 (×7): qty 2

## 2021-07-01 MED ORDER — CALCIUM CARBONATE ANTACID 1250 MG/5ML PO SUSP: 500.0000 mg | Freq: Four times a day (QID) | ORAL | Status: DC | PRN

## 2021-07-01 MED ORDER — SODIUM CHLORIDE 0.9 % IV SOLN
2.0000 g | INTRAVENOUS | Status: DC
  Administered 2021-07-02 – 2021-07-04 (×3): 2 g via INTRAVENOUS
  Filled 2021-07-01 (×3): qty 2

## 2021-07-01 MED ORDER — INSULIN ASPART PROT & ASPART (70-30 MIX) 100 UNIT/ML ~~LOC~~ SUSP: 30.0000 [IU] | Freq: Two times a day (BID) | SUBCUTANEOUS | Status: DC

## 2021-07-01 MED ORDER — PANTOPRAZOLE SODIUM 40 MG PO TBEC
40.0000 mg | DELAYED_RELEASE_TABLET | Freq: Every day | ORAL | Status: DC
  Administered 2021-07-01 – 2021-07-06 (×7): 40 mg via ORAL
  Filled 2021-07-01 (×6): qty 1

## 2021-07-01 MED ORDER — NEPRO/CARBSTEADY PO LIQD: 237.0000 mL | Freq: Three times a day (TID) | ORAL | Status: DC | PRN

## 2021-07-01 MED ORDER — DOCUSATE SODIUM 283 MG RE ENEM: 1.0000 | ENEMA | RECTAL | Status: DC | PRN

## 2021-07-01 MED ORDER — SODIUM CHLORIDE 0.9% FLUSH
3.0000 mL | Freq: Two times a day (BID) | INTRAVENOUS | Status: DC
  Administered 2021-07-01 – 2021-07-10 (×18): 3 mL via INTRAVENOUS

## 2021-07-01 MED ORDER — METRONIDAZOLE 500 MG/100ML IV SOLN
500.0000 mg | Freq: Two times a day (BID) | INTRAVENOUS | Status: DC
Start: 2021-07-01 — End: 2021-07-05
  Administered 2021-07-01 – 2021-07-05 (×8): 500 mg via INTRAVENOUS
  Filled 2021-07-01 (×9): qty 100

## 2021-07-01 MED ORDER — LEVOTHYROXINE SODIUM 100 MCG PO TABS
200.0000 ug | ORAL_TABLET | Freq: Every day | ORAL | Status: DC
  Administered 2021-07-01 – 2021-07-09 (×9): 200 ug via ORAL
  Filled 2021-07-01 (×9): qty 2

## 2021-07-01 MED ORDER — VANCOMYCIN HCL 2000 MG/400ML IV SOLN
2000.0000 mg | Freq: Once | INTRAVENOUS | Status: AC
  Administered 2021-07-01: 2000 mg via INTRAVENOUS
  Filled 2021-07-01: qty 400

## 2021-07-01 MED ORDER — SODIUM CHLORIDE 0.9 % IV BOLUS
1000.0000 mL | Freq: Once | INTRAVENOUS | Status: AC
  Administered 2021-07-01: 1000 mL via INTRAVENOUS

## 2021-07-01 MED ORDER — ONDANSETRON HCL 4 MG PO TABS: 4.0000 mg | ORAL_TABLET | Freq: Four times a day (QID) | ORAL | Status: DC | PRN

## 2021-07-01 MED ORDER — CAMPHOR-MENTHOL 0.5-0.5 % EX LOTN
1.0000 "application " | TOPICAL_LOTION | Freq: Three times a day (TID) | CUTANEOUS | Status: DC | PRN
  Administered 2021-07-03: 1 via TOPICAL
  Filled 2021-07-01: qty 222

## 2021-07-01 MED ORDER — POLYETHYLENE GLYCOL 3350 17 G PO PACK
17.0000 g | PACK | Freq: Every day | ORAL | Status: DC
  Administered 2021-07-01 – 2021-07-09 (×4): 17 g via ORAL
  Filled 2021-07-01 (×9): qty 1

## 2021-07-01 MED ORDER — POLYVINYL ALCOHOL 1.4 % OP SOLN: 2.0000 [drp] | Freq: Every day | OPHTHALMIC | Status: DC | PRN

## 2021-07-01 MED ORDER — VANCOMYCIN HCL IN DEXTROSE 1-5 GM/200ML-% IV SOLN
1000.0000 mg | INTRAVENOUS | Status: DC
  Administered 2021-07-03: 1000 mg via INTRAVENOUS
  Filled 2021-07-01: qty 200

## 2021-07-01 MED ORDER — HEPARIN SODIUM (PORCINE) 5000 UNIT/ML IJ SOLN
5000.0000 [IU] | Freq: Two times a day (BID) | INTRAMUSCULAR | Status: DC
  Administered 2021-07-01 – 2021-07-10 (×20): 5000 [IU] via SUBCUTANEOUS
  Filled 2021-07-01 (×20): qty 1

## 2021-07-01 MED ORDER — SORBITOL 70 % SOLN
30.0000 mL | Status: DC | PRN
  Filled 2021-07-01: qty 30

## 2021-07-01 MED ORDER — ALLOPURINOL 100 MG PO TABS
100.0000 mg | ORAL_TABLET | Freq: Every day | ORAL | Status: DC
  Administered 2021-07-01 – 2021-07-10 (×10): 100 mg via ORAL
  Filled 2021-07-01 (×10): qty 1

## 2021-07-01 MED ORDER — ACETAMINOPHEN 650 MG RE SUPP: 650.0000 mg | Freq: Four times a day (QID) | RECTAL | Status: DC | PRN

## 2021-07-01 MED ORDER — ONDANSETRON HCL 4 MG/2ML IJ SOLN: 4.0000 mg | Freq: Four times a day (QID) | INTRAMUSCULAR | Status: DC | PRN

## 2021-07-01 MED ORDER — LACTATED RINGERS IV SOLN: INTRAVENOUS | Status: DC

## 2021-07-01 MED ORDER — HEPARIN SODIUM (PORCINE) 5000 UNIT/ML IJ SOLN: 5000.0000 [IU] | Freq: Three times a day (TID) | INTRAMUSCULAR | Status: DC

## 2021-07-01 MED ORDER — SODIUM CHLORIDE 0.9 % IV SOLN
2.0000 g | Freq: Once | INTRAVENOUS | Status: AC
  Administered 2021-07-01: 2 g via INTRAVENOUS
  Filled 2021-07-01: qty 2

## 2021-07-01 MED ORDER — POLYETHYLENE GLYCOL 3350 POWD: 17.0000 g | Freq: Every day | Status: DC

## 2021-07-01 NOTE — H&P (Signed)
History and Physical    [name] [name] WCB:[id number] DOB: April 27, 1951 DOA: 07/01/2021  PCP: [name], MD Consultants:  [name] - nephrology; Gwenlyn Found - cardiology; [facility] - oncology; Fields - vascular surgery Patient coming from:  Home - lives with son; NOK: Glenda Chroman, son, [phone]   Chief Complaint: RLE swelling  HPI: [name] [name] is a 70 y.o. female with medical history significant of chronic diastolic CHF; chronic pain; COPD; DM; HTN; HLD; hypothyroidism; OSA not on CPAP; obesity; stage 4 CKD; and PVD presenting with RLE swelling.  She was reports that she was "hurting real bad".  She reported BLE pain, neither worse than the other.  She was too somnolent to answer additional questions.  Per EDP note, she was very weak at [facility] and unable to ambulate without assistance.  I was unable to reach her son by telephone at the time of admission.   ED Course: Likely cellulitis, fever, tachycardia, WBC 35.9.  Given abx, cultures pending, UA pending.  Vascular study ordered.  RLE with mild erythema, warmth, spots on thigh, very TTP.  Review of Systems: Unable to perform   COVID Vaccine Status:  At least 1 shot  Past Medical History:  Diagnosis Date   Anemia    Arthritis    Asthma    Chronic diastolic (congestive) heart failure (HCC)    Chronic kidney disease    Chronic pain syndrome    Class 2 obesity due to excess calories with body mass index (BMI) of 36.0 to 36.9 in adult    COPD (chronic obstructive pulmonary disease) (HCC)    Diabetes mellitus without complication (HCC)    Full dentures    GERD (gastroesophageal reflux disease)    Gout    History of hiatal hernia    History of transfusion    Hx MRSA infection    abscess left groin   Hyperlipidemia    Hypertension    Hypothyroid    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HCC)    Peripheral vascular disease (HCC)    Sleep apnea    UNABLE TO TOLERATE C PAP   Stress incontinence    TIA (transient ischemic attack)     X2 NO  RESIDUAL PROBLEMS   Wears glasses     Past Surgical History:  Procedure Laterality Date   ABDOMINAL HYSTERECTOMY     BACK SURGERY  2694   BASCILIC VEIN TRANSPOSITION Left 04/05/2020   Procedure: BASILIC VEIN TRANSPOSITION FIRST STAGE;  Surgeon: Angelia Mould, MD;  Location: [facility] OR;  Service: Vascular;  Laterality: Left;   COLON SURGERY  1995   DUE TO POLYP   DILATION AND CURETTAGE OF UTERUS     ESOPHAGOGASTRODUODENOSCOPY N/A 01/25/2021   Procedure: ESOPHAGOGASTRODUODENOSCOPY (EGD);  Surgeon: Clarene Essex, MD;  Location: [facility];  Service: Endoscopy;  Laterality: N/A;   FOREARM FRACTURE SURGERY     Left arm   HERNIA REPAIR     w/ mesh   INCISION AND DRAINAGE ABSCESS Left 02/04/2013   Procedure: INCISION AND DRAINAGE LEFT BUTTOCK ABSCESS; INCISION AND DRAINAGE LEFT BREAST ABSCESS;  Surgeon: Harl Bowie, MD;  Location: [place];  Service: General;  Laterality: Left;   INCISION AND DRAINAGE ABSCESS N/A 02/12/2013   Procedure: INCISION AND DEBRIDEMENT BUTTOCK WOUND ;  Surgeon: [name]. Georgette Dover, MD;  Location: [place];  Service: General;  Laterality: N/A;   INCISION AND DRAINAGE ABSCESS N/A 02/14/2013   Procedure: INCISION AND DRAINAGE/DRESSING CHANGE;  Surgeon: Harl Bowie, MD;  Location: [place];  Service: General;  Laterality: N/A;   INCISION AND DRAINAGE ABSCESS N/A 03/21/2015   Procedure: INCISION AND DRAINAGE PUBIC ABSCESS;  Surgeon: Excell Seltzer, MD;  [facility]: [facility];  Service: General;  Laterality: N/A;   INCISION AND DRAINAGE PERIRECTAL ABSCESS Left 02/10/2013   Procedure: IRRIGATION AND DEBRIDEMENT OF BUTTOCK/PERINEAL ABSCESS;  Surgeon: [name]. Georgette Dover, MD;  Location: [place];  Service: General;  Laterality: Left;   INCISION AND DRAINAGE PERIRECTAL ABSCESS N/A 02/16/2013   Procedure: IRRIGATION AND DEBRIDEMENT PERINEAL ABSCESS;  Surgeon: Zenovia Jarred, MD;  Location: [place];  Service: General;  Laterality: N/A;   IRRIGATION AND DEBRIDEMENT ABSCESS N/A 02/06/2013    Procedure: IRRIGATION AND DEBRIDEMENT BUTTOCK ABSCESS AND DRESSING CHANGE;  Surgeon: Harl Bowie, MD;  Location: [place];  Service: General;  Laterality: N/A;   IRRIGATION AND DEBRIDEMENT ABSCESS Left 02/08/2013   Procedure: IRRIGATION AND DEBRIDEMENT ABSCESS/DRESSING CHANGE;  Surgeon: Gwenyth Ober, MD;  Location: [place];  Service: General;  Laterality: Left;   JOINT REPLACEMENT  2010 / 2012    [place] N/A 07/02/2014   Procedure: LEFT HEART CATHETERIZATION WITH CORONARY ANGIOGRAM;  Surgeon: Lorretta Harp, MD;  Location: [facility] CATH LAB;  Service: Cardiovascular;  Laterality: N/A;   LOWER EXTREMITY ANGIOGRAM Right 04/20/2016   Procedure: Lower Extremity Angiogram;  Surgeon: Elam Dutch, MD;  Location: [place] CV LAB;  Service: Cardiovascular;  Laterality: Right;   MULTIPLE TOOTH EXTRACTIONS     PERIPHERAL VASCULAR CATHETERIZATION N/A 04/20/2016   Procedure: Abdominal Aortogram;  Surgeon: Elam Dutch, MD;  Location: [place] CV LAB;  Service: Cardiovascular;  Laterality: N/A;   PERIPHERAL VASCULAR CATHETERIZATION Right 04/20/2016   Procedure: Peripheral Vascular Intervention;  Surgeon: Elam Dutch, MD;  Location: [place] CV LAB;  Service: Cardiovascular;  Laterality: Right;  [place]   RIGHT HEART CATH N/A 10/27/2018   Procedure: RIGHT HEART CATH;  Surgeon: Larey Dresser, MD;  Location: [place] CV LAB;  Service: Cardiovascular;  Laterality: N/A;   RIGHT HEART CATH N/A 03/20/2019   Procedure: RIGHT HEART CATH;  Surgeon: Larey Dresser, MD;  Location: [place] CV LAB;  Service: Cardiovascular;  Laterality: N/A;   THYROIDECTOMY     TOTAL HIP REVISION Right 08/11/2014   Procedure: RIGHT ACETABULAR REVISION;  Surgeon: Gearlean Alf, MD;  Location: [facility];  Service: Orthopedics;  Laterality: Right;   VASCULAR SURGERY      Social History   Socioeconomic History   Marital status: Legally  Separated    Spouse name: Not on file   Number of children: 1   Years of education: college   Highest education level: Not on file  Occupational History    Comment: Disabled  Tobacco Use   Smoking status: Some Days    Packs/day: 0.25    Years: 40.00    Pack years: 10.00    Types: Cigarettes   Smokeless tobacco: Never  Vaping Use   Vaping Use: Never used  Substance and Sexual Activity   Alcohol use: Yes    Alcohol/week: 1.0 standard drink    Types: 1 Standard drinks or equivalent per week    Comment: OCC   Drug use: No   Sexual activity: Not on file  Other Topics Concern   Not on file  Social History Narrative   ** Merged History Encounter **       Patient is separated  and lives at home  with her friend. Patient is disabled. Education one year of college Right handed Caffeine two cups daily.     Social Determinants of Health   Financial Resource Strain: Not on file  Food Insecurity: Not on file  Transportation Needs: Not on file  Physical Activity: Not on file  Stress: Not on file  Social Connections: Not on file  Intimate Partner Violence: Not on file    Allergies  Allergen Reactions   Nebivolol Swelling    Chest pain (reaction to Bystolic)   Ace Inhibitors Swelling and Other (See Comments)    Tongue swell   Ace Inhibitors Swelling   Morphine And Related Itching   Morphine And Related Itching    Family History  Problem Relation Age of Onset   Diabetes Brother    Cardiomyopathy Mother    Heart disease Mother    Cancer Father     Prior to Admission medications   Medication Sig Start Date End Date Taking? Authorizing Provider  acetaminophen (TYLENOL) 325 MG tablet Take 650 mg by mouth every 6 (six) hours as needed.    [provider]  allopurinol (ZYLOPRIM) 100 MG tablet Take 1 tablet (100 mg total) by mouth daily. 03/05/21   Medina-Vargas, Monina C, NP  B Complex Vitamins (VITAMIN B COMPLEX PO) Take 1 capsule by mouth daily.    [provider]  bisacodyl (DULCOLAX) 10 MG suppository Place 10 mg rectally as needed for moderate constipation.    [provider]  carvedilol (COREG) 12.5 MG tablet Take 1 tablet (12.5 mg total) by mouth 2 (two) times daily. 03/05/21   Medina-Vargas, Monina C, NP  cholecalciferol (VITAMIN D3) 25 MCG (1000 UNIT) tablet Take 1,000 Units by mouth daily.    [provider]  cinacalcet (SENSIPAR) 30 MG tablet Take 1 tablet (30 mg total) by mouth daily. 03/05/21   Medina-Vargas, Monina C, NP  clopidogrel (PLAVIX) 75 MG tablet Take 1 tablet (75 mg total) by mouth daily. 03/05/21   Medina-Vargas, Monina C, NP  diclofenac Sodium (VOLTAREN) 1 % GEL Apply 1 application topically 4 (four) times daily as needed (pain). 08/15/20   [provider]  hydrOXYzine (ATARAX/VISTARIL) 25 MG tablet Take 1 tablet (25 mg total) by mouth 2 (two) times daily as needed for anxiety. 03/05/21   Medina-Vargas, Monina C, NP  insulin lispro protamine-lispro (HUMALOG 75/25 MIX) (75-25) 100 UNIT/ML SUSP injection Inject 30-40 Units into the skin See admin instructions. Inject 40 units into the skin morning and 30 units in the evening    [provider]  levothyroxine (SYNTHROID) 200 MCG tablet Take 1 tablet (200 mcg total) by mouth daily at 6 (six) AM. 03/05/21   Medina-Vargas, Monina C, NP  Magnesium Hydroxide (MILK OF MAGNESIA PO) Take 30 mLs by mouth as needed.    [provider]  magnesium oxide (MAG-OX) 400 (241.3 Mg) MG tablet Take 1 tablet (400 mg total) by mouth 2 (two) times daily. 01/14/20   Lavina Hamman, MD  metoCLOPramide (REGLAN) 5 MG tablet Take 1 tablet (5 mg total) by mouth 3 (three) times daily. 03/05/21   Medina-Vargas, Monina C, NP  metolazone (ZAROXOLYN) 5 MG tablet Take 1 tablet (5 mg total) by mouth 3 (three) times a week. Takes 5mg  on Monday, Wednesday and Friday 03/06/21   Medina-Vargas, Monina C, NP  Multiple Vitamin (MULTIVITAMIN WITH MINERALS) TABS tablet Take 1 tablet  by mouth daily.    [provider]  oxyCODONE-acetaminophen (PERCOCET/ROXICET) 5-325 MG tablet Take 1  tablet by mouth every 6 (six) hours as needed for moderate pain or severe pain. 03/05/21   Medina-Vargas, Monina C, NP  pantoprazole (PROTONIX) 40 MG tablet Take 1 tablet (40 mg total) by mouth 2 (two) times daily before a meal for 60 days, THEN 1 tablet (40 mg total) daily. 03/05/21 07/03/21  Medina-Vargas, Monina C, NP  Polyethylene Glycol 3350 POWD 17 g by Does not apply route daily. Mix with 6-8 oz of fluid choice    [provider]  polyvinyl alcohol (LIQUIFILM TEARS) 1.4 % ophthalmic solution Place 2 drops into both eyes daily as needed for dry eyes.    [provider]  Potassium Chloride ER 20 MEQ TBCR Take 1 tablet by mouth 3 (three) times a week. Tuesday, Thursdays and Saturdays 03/06/21   Medina-Vargas, Monina C, NP  Sodium Phosphates (RA SALINE ENEMA RE) Place rectally as needed.    [provider]  torsemide (DEMADEX) 100 MG tablet Take 1 tablet (100 mg total) by mouth 2 (two) times daily. 03/05/21   Medina-Vargas, Monina C, NP  TRULICITY 1.5 VH/8.4ON SOPN Inject 1.5 mg into the skin once a week. Monday 03/05/21   Medina-Vargas, Jaymes Graff C, NP    Physical Exam: Vitals:   07/01/21 1030 07/01/21 1100 07/01/21 1130 07/01/21 1200  BP: (!) 112/50 (!) 137/57 (!) 109/40 (!) 113/47  Pulse: 99 98 89 94  Resp: 20 18 12 18   Temp:    98.5 F (36.9 C)  TempSrc:      SpO2: 99% 94% 95% 100%  Weight:      Height:         General:  Appears calm and comfortable and is in NAD, somnolent Eyes:  EOMI, normal lids, iris ENT:  grossly normal hearing, lips & tongue, mildly dry mm Neck:  no LAD, masses or thyromegaly Cardiovascular:  RRR, no m/r/g.  Respiratory:   CTA bilaterally with no rales/rhonchi, scant expiratory wheezing while sleeping.  Normal respiratory effort. Abdomen:  soft, NT, ND Skin:  RLE with lymphedema; LLE with medial lower leg bruise and mild patchy  erythema/warmth    Musculoskeletal:  no bony abnormality Lower extremity:   Limited foot exam with no ulcerations. B lower legs and feet with TTP. Psychiatric:  somnolent mood and affect, speech sparse but appropriate, lapsed back to sleep continuously Neurologic:  unable to perform    Radiological Exams on Admission: Independently reviewed - see discussion in A/P where applicable  DG Chest Port 1 View  Result Date: 07/01/2021 CLINICAL DATA:  70 year old female with possible sepsis. EXAM: PORTABLE CHEST 1 VIEW COMPARISON:  Portable chest 02/15/2021 and earlier. FINDINGS: Portable AP semi upright view at 0417 hours. Mildly lower lung volumes. Stable cardiomegaly and mediastinal contours. Visualized tracheal air column is within normal limits. No pneumothorax, pulmonary edema, pleural effusion or consolidation. Vague chronic increased opacity in the right upper lobe is seen related to focal increased pleural fat or fatty pleural mass on a 2020 chest CT (benign). No acute pulmonary opacity. Degenerative changes at the right shoulder. No acute osseous abnormality identified. Paucity of bowel gas in the upper abdomen. IMPRESSION: No acute cardiopulmonary abnormality. Chronic cardiomegaly. Benign increased pleural fat or lipoma in the right upper lung. Electronically Signed   By: Genevie Ann M.D.   On: 07/01/2021 04:53   VAS [name] LOWER EXTREMITY VENOUS (DVT) ([facility] 7a-7p)  Result Date: 07/01/2021  Lower Venous DVT Study Patient Name:  [name] [name]  Date of Exam:   07/01/2021  Medical Rec #: [id number]          Accession #:    [id number] Date of Birth: 25-Jan-1951           Patient Gender: F Patient Age:   49 years Exam Location:  [facility] Procedure:      VAS [name] LOWER EXTREMITY VENOUS (DVT) Referring Phys: KRISTIE HORTON --------------------------------------------------------------------------------  Indications: Edema.  Limitations: Patient unable to reposition, pain/intolerance to probe  pressure, body habitus/tissue properties, acoustic shadowing due to overlying arterial calcification. Comparison Study: 04-27-2021 Most recent prior waas negative for DVT. Performing Technologist: Darlin Coco RDMS, RVT  Examination Guidelines: A complete evaluation includes B-mode imaging, spectral Doppler, color Doppler, and power Doppler as needed of all accessible portions of each vessel. Bilateral testing is considered an integral part of a complete examination. Limited examinations for reoccurring indications may be performed as noted. The reflux portion of the exam is performed with the patient in reverse Trendelenburg.  +---------+---------------+---------+-----------+----------+--------------+ RIGHT    CompressibilityPhasicitySpontaneityPropertiesThrombus Aging +---------+---------------+---------+-----------+----------+--------------+ CFV      Full           Yes      Yes                                 +---------+---------------+---------+-----------+----------+--------------+ SFJ      Full                                                        +---------+---------------+---------+-----------+----------+--------------+ FV Prox  Full                                                        +---------+---------------+---------+-----------+----------+--------------+ FV Mid   Full                                                        +---------+---------------+---------+-----------+----------+--------------+ FV Distal               Yes      Yes                                 +---------+---------------+---------+-----------+----------+--------------+ PFV      Full                                                        +---------+---------------+---------+-----------+----------+--------------+ POP      Full           Yes      Yes                                 +---------+---------------+---------+-----------+----------+--------------+ PTV  Not visualized +---------+---------------+---------+-----------+----------+--------------+ PERO                                                  Not visualized +---------+---------------+---------+-----------+----------+--------------+   +---------+---------------+---------+-----------+----------+--------------+ LEFT     CompressibilityPhasicitySpontaneityPropertiesThrombus Aging +---------+---------------+---------+-----------+----------+--------------+ CFV      Full           Yes      Yes                                 +---------+---------------+---------+-----------+----------+--------------+ SFJ      Full                                                        +---------+---------------+---------+-----------+----------+--------------+ FV Prox  Full                                                        +---------+---------------+---------+-----------+----------+--------------+ FV Mid   Full                                                        +---------+---------------+---------+-----------+----------+--------------+ FV Distal               Yes      Yes                                 +---------+---------------+---------+-----------+----------+--------------+ PFV      Full                                                        +---------+---------------+---------+-----------+----------+--------------+ POP      Full           Yes      Yes                                 +---------+---------------+---------+-----------+----------+--------------+ PTV      Full                                                        +---------+---------------+---------+-----------+----------+--------------+ PERO     Full                                                        +---------+---------------+---------+-----------+----------+--------------+  Summary: RIGHT: - There is no evidence of deep vein thrombosis in  the lower extremity.  - No cystic structure found in the popliteal fossa.  - Incidental: Heavy arterial calcification with monophasic flow observed throughout lower extremity.  LEFT: - There is no evidence of deep vein thrombosis in the lower extremity.  - No cystic structure found in the popliteal fossa.  - Incidental: Heavy arterial calcification with monophasic flow observed throughout lower extremity.  *See table(s) above for measurements and observations.    Preliminary     EKG: Independently reviewed.  Sinus tachycardia with rate 103; trigeminy; IVCD; LVH   Labs on Admission: I have personally reviewed the available labs and imaging studies at the time of the admission.  Pertinent labs:   Glucose 145 BUN 160/Creatinine 2.90/GFR 17 - stable Albumin 3.0 WBC 35.9 Hgb 7.0 Lactate 1.5   Assessment/Plan Principal Problem:   Sepsis due to undetermined organism [facility]) Active Problems:   PAD (peripheral artery disease) (HCC)   Diabetes mellitus without complication (HCC)   Class 2 obesity due to excess calories with body mass index (BMI) of 36.0 to 36.9 in adult   Hyperlipidemia   Hypothyroidism   Chronic diastolic (congestive) heart failure (HCC)   Chronic pain syndrome   Cellulitis of leg, right   Concern for Sepsis -Sepsis indicates life-threatening organ dysfunction with mortality >10%, caused by dysregulation to host response.   -SIRS criteria in this patient includes: Leukocytosis, fever, tachycardia, tachypnea  -Patient has no current evidence of acute organ failure that is not easily explained by another condition. -While awaiting blood cultures, this may be a preseptic condition. -Sepsis protocol initiated -Suspected source is possible cellulitis, although this is unclear.  DVT US negative -Blood and urine cultures pending -Will place in observation status with telemetry and continue to monitor -Treat with IV Cefepime/Vanc for undifferentiated sepsis -Will add HIV -Will  order procalcitonin level.   As the procalcitonin level normalizes, it will be reasonable to consider de-escalation of antibiotic coverage. -Given her somnolence, will hold all sedating medications for now - including hydroxyzine, Percocet  Chronic pain -She appears to have chronic pain at baseline -I have reviewed this patient in the [place] Controlled Substances Reporting System.  She is receiving medications from only one provider and appears to be taking them as prescribed. -She is at high risk of opioid misuse, diversion, or overdose.   Chronic combined CHF -02/2021 echo with EF 45-50% and grade 1 diastolic dysfunction -Hold Metalozone and demadex for now -Appears compensated for now  PVD -Continue Plavix  HTN -Continue Coreg  DM -Last A1c was 5.8, indicating very good control -Continue insulin - change to 70/30 per formulary -[place] with moderate-scale SSI   HLD -She does not appear to be taking medications for this issue at this time   Hypothyroidism -Check TSH -Continue Synthroid at current dose for now   Stage 4 CKD -Advanced CKD, but appears to be stable currently -Continue Sensipar -Chronic anemia, receives Epoetin Alfa injections q2 weeks but missed her injection on 9/20 -No current concern for GI bleeding by history, will use Heparin SQ BID for DVT prevention and follow Hgb  OSA -Not on CPAP  Obesity -Body mass index is 36.02 kg/m..  -Weight loss should be encouraged -Outpatient PCP/bariatric medicine f/u encouraged      Note: This patient has been tested and is pending for the novel coronavirus COVID-19. She has been at least partially vaccinated against COVID-19.    DVT prophylaxis:  Heparin Code Status:  Full  Family Communication: None present; I was not able to reach her son by telephone at the time of admission Disposition Plan:  The patient is from: home  Anticipated d/c is to: home without [facility] services  Anticipated d/c date will  depend on clinical response to treatment, but possibly as early as tomorrow if she has excellent response to treatment  Patient is currently: acutely ill Consults called: PT/OT Admission status: It is my clinical opinion that referral for OBSERVATION is reasonable and necessary in this patient based on the above information provided. The aforementioned taken together are felt to place the patient at high risk for further clinical deterioration. However it is anticipated that the patient may be medically stable for discharge from the hospital within 24 to 48 hours.     Karmen Bongo MD [facility]   How to contact the [facility] Attending or Consulting provider [place] or covering provider during after hours [place], for this patient?  Check the care team in [facility] and look for a) attending/consulting [facility] provider listed and b) the [facility] team listed Log into [email or web address] and use [facility] universal password to access. If you do not have the password, please contact the hospital operator. Locate the [facility] provider you are looking for under [facility] and page to a number that you can be directly reached. If you still have difficulty reaching the provider, please page the [facility] (Director on Call) for the Hospitalists listed on amion for assistance.   07/01/2021, 1:26 PM

## 2021-07-01 NOTE — Progress Notes (Signed)
Lower extremity venous bilateral study completed.  Preliminary results relayed to Lorin Mercy, MD.  See CV Proc for preliminary results report.   Darlin Coco, RDMS, RVT

## 2021-07-01 NOTE — ED Provider Notes (Signed)
[facility] EMERGENCY DEPARTMENT Provider Note   CSN: [id number] Arrival date & time: 07/01/21  0321     History Chief Complaint  Patient presents with   Weakness    Pt woke up to go to bathroom and felt very weak, did not fall but was assisted to the floor by family.  Pt can ambulate with assistance    [name] [name] is a 70 y.o. female.  70 year old female who presents emerged from today for feeling weak.  Patient states that this came on pretty suddenly.  She went to stand up and had difficulty standing up secondary to weakness.  She did not fall but was assisted to the ground.  Patient states that she has been otherwise feeling okay.  No other symptoms besides bilateral arm and leg pain.  No other trauma.  No history of the same.   Weakness     Past Medical History:  Diagnosis Date   Anemia    Arthritis    Asthma    Bronchitis    CHF (congestive heart failure) (HCC)    Chronic kidney disease    Chronic kidney disease    Chronic pain syndrome    COPD (chronic obstructive pulmonary disease) (HCC)    Cramping of feet    Diabetes mellitus    Diabetes mellitus without complication (HCC)    Dyspnea    Full dentures    GERD (gastroesophageal reflux disease)    Gout    Headache    History of hiatal hernia    History of hip replacement, total    History of transfusion    Hx MRSA infection    abscess left groin   Hx of cardiac cath 06/2014   Hyperlipidemia    Hypertension    Hypothyroid    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HCC)    Morbidly obese (HCC)    Peripheral vascular disease (HCC)    Positive cardiac stress test 12/2013   anterior and lateral ischemia on Myoview   Sleep apnea    UNABLE TO TOLERATE C PAP   Stress incontinence    Thyroid disease    TIA (transient ischemic attack)     X2 NO RESIDUAL PROBLEMS   Wears glasses     Patient Active Problem List   Diagnosis Date Noted   SARS-CoV-2 positive 02/21/2021   Increased weakness when  ambulating 02/17/2021   Intractable nausea and vomiting 02/15/2021   Symptomatic anemia 01/24/2021   Anemia, unspecified 01/08/2020   Bacteremia 01/07/2020   UTI (urinary tract infection) 01/06/2020   Anemia 10/26/2019   S/P lumbar fusion 09/18/2019   Chronic CHF (congestive heart failure) ([place]) 03/16/2019   Leukocytosis 03/16/2019   Chest pain 11/27/2018   Pre-syncope 11/27/2018   Epigastric abdominal pain 11/27/2018   Solitary pulmonary nodule 11/06/2018   Hypertensive heart and renal disease, stage 1-4 or unspecified chronic kidney disease, with heart failure ([place]) 10/20/2018   CHF (congestive heart failure), NYHA class III, acute on chronic, systolic ([place]) 40/98/1191   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, unspecified extremity ([place]) 04/19/2016   Cellulitis of abdominal wall 03/21/2015   Acute on chronic renal failure ([place]) 03/21/2015   DM (diabetes mellitus), type 2 with renal complications ([place]) 47/82/9562   Abdominal wall abscess 03/21/2015   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   Low urine output 08/11/2014   Acute on chronic systolic CHF (congestive heart failure), NYHA class 3 ([place]) 08/11/2014   Asthma, chronic 08/11/2014  Hypothyroidism 08/11/2014   OSA (obstructive sleep apnea) 08/11/2014   Positive cardiac stress test 06/29/2014   Hyperlipidemia 02/12/2014   Left arm weakness 12/23/2013   TIA (transient ischemic attack) 12/23/2013   Left buttock abscess 10/12/2013   Cellulitis and abscess of leg, right 04/16/2013   Acute blood loss anemia 02/14/2013   HTN (hypertension) 02/14/2013   Cellulitis and abscess of buttock 02/09/2013   Morbid obesity ([place]) 02/03/2013   Diarrhea 02/03/2013   Hyponatremia 02/03/2013   CKD (chronic kidney disease), stage IV ([place]) 02/02/2013   Cellulitis 02/02/2013   PAD (peripheral artery disease) ([place]) 04/26/2011   Tobacco abuse 04/26/2011    Past Surgical History:  Procedure Laterality Date    ABDOMINAL HYSTERECTOMY     BACK SURGERY  2229   BASCILIC VEIN TRANSPOSITION Left 04/05/2020   Procedure: BASILIC VEIN TRANSPOSITION FIRST STAGE;  Surgeon: Angelia Mould, MD;  Location: [facility];  Service: Vascular;  Laterality: Left;   COLON SURGERY  1995   DUE TO POLYP   DILATION AND CURETTAGE OF UTERUS     ESOPHAGOGASTRODUODENOSCOPY N/A 01/25/2021   Procedure: ESOPHAGOGASTRODUODENOSCOPY (EGD);  Surgeon: Clarene Essex, MD;  Location: [facility];  Service: Endoscopy;  Laterality: N/A;   FOREARM FRACTURE SURGERY     Left arm   HERNIA REPAIR     w/ mesh   INCISION AND DRAINAGE ABSCESS Left 02/04/2013   Procedure: INCISION AND DRAINAGE LEFT BUTTOCK ABSCESS; INCISION AND DRAINAGE LEFT BREAST ABSCESS;  Surgeon: Harl Bowie, MD;  Location: [facility];  Service: General;  Laterality: Left;   INCISION AND DRAINAGE ABSCESS N/A 02/12/2013   Procedure: INCISION AND DEBRIDEMENT BUTTOCK WOUND ;  Surgeon: [name]. Georgette Dover, MD;  Location: [facility];  Service: General;  Laterality: N/A;   INCISION AND DRAINAGE ABSCESS N/A 02/14/2013   Procedure: INCISION AND DRAINAGE/DRESSING CHANGE;  Surgeon: Harl Bowie, MD;  Location: [facility];  Service: General;  Laterality: N/A;   INCISION AND DRAINAGE ABSCESS N/A 03/21/2015   Procedure: INCISION AND DRAINAGE PUBIC ABSCESS;  Surgeon: Excell Seltzer, MD;  Location: [facility];  Service: General;  Laterality: N/A;   INCISION AND DRAINAGE PERIRECTAL ABSCESS Left 02/10/2013   Procedure: IRRIGATION AND DEBRIDEMENT OF BUTTOCK/PERINEAL ABSCESS;  Surgeon: [name]. Georgette Dover, MD;  Location: [facility];  Service: General;  Laterality: Left;   INCISION AND DRAINAGE PERIRECTAL ABSCESS N/A 02/16/2013   Procedure: IRRIGATION AND DEBRIDEMENT PERINEAL ABSCESS;  Surgeon: Zenovia Jarred, MD;  Location: [facility];  Service: General;  Laterality: N/A;   IRRIGATION AND DEBRIDEMENT ABSCESS N/A 02/06/2013   Procedure: IRRIGATION AND DEBRIDEMENT BUTTOCK ABSCESS AND DRESSING CHANGE;  Surgeon: Harl Bowie, MD;  Location: [facility];  Service: General;  Laterality: N/A;   IRRIGATION AND DEBRIDEMENT ABSCESS Left 02/08/2013   Procedure: IRRIGATION AND DEBRIDEMENT ABSCESS/DRESSING CHANGE;  Surgeon: Gwenyth Ober, MD;  Location: [facility];  Service: General;  Laterality: Left;   JOINT REPLACEMENT  2010 / 2012    [place] N/A 07/02/2014   Procedure: LEFT HEART CATHETERIZATION WITH CORONARY ANGIOGRAM;  Surgeon: Lorretta Harp, MD;  Location: [facility] CATH LAB;  Service: Cardiovascular;  Laterality: N/A;   LOWER EXTREMITY ANGIOGRAM Right 04/20/2016   Procedure: Lower Extremity Angiogram;  Surgeon: Elam Dutch, MD;  Location: [facility] CV LAB;  Service: Cardiovascular;  Laterality: Right;   MULTIPLE TOOTH EXTRACTIONS     PERIPHERAL VASCULAR CATHETERIZATION N/A 04/20/2016   Procedure: Abdominal Aortogram;  Surgeon: Elam Dutch, MD;  Location: [facility] CV LAB;  Service: Cardiovascular;  Laterality: N/A;   PERIPHERAL VASCULAR CATHETERIZATION Right 04/20/2016   Procedure: Peripheral Vascular Intervention;  Surgeon: Elam Dutch, MD;  Location: [facility];  Service: Cardiovascular;  Laterality: Right;  [place]   RIGHT HEART CATH N/A 10/27/2018   Procedure: RIGHT HEART CATH;  Surgeon: Larey Dresser, MD;  Location: [facility] CV LAB;  Service: Cardiovascular;  Laterality: N/A;   RIGHT HEART CATH N/A 03/20/2019   Procedure: RIGHT HEART CATH;  Surgeon: Larey Dresser, MD;  Location: [facility] CV LAB;  Service: Cardiovascular;  Laterality: N/A;   THYROIDECTOMY     TOTAL HIP REVISION Right 08/11/2014   Procedure: RIGHT ACETABULAR REVISION;  Surgeon: Gearlean Alf, MD;  Location: [facility];  Service: Orthopedics;  Laterality: Right;   VASCULAR SURGERY       OB History   No obstetric history on file.     Family History  Problem Relation Age of Onset   Diabetes Brother    Cardiomyopathy Mother    Heart disease  Mother    Cancer Father     Social History   Tobacco Use   Smoking status: Some Days    Packs/day: 0.25    Years: 40.00    Pack years: 10.00    Types: Cigarettes   Smokeless tobacco: Never  Vaping Use   Vaping Use: Never used  Substance Use Topics   Alcohol use: Yes    Alcohol/week: 1.0 standard drink    Types: 1 Standard drinks or equivalent per week    Comment: OCC   Drug use: No    Home Medications Prior to Admission medications   Medication Sig Start Date End Date Taking? Authorizing Provider  acetaminophen (TYLENOL) 325 MG tablet Take 650 mg by mouth every 6 (six) hours as needed.    [provider]  allopurinol (ZYLOPRIM) 100 MG tablet Take 1 tablet (100 mg total) by mouth daily. 03/05/21   Medina-Vargas, Monina C, NP  B Complex Vitamins (VITAMIN B COMPLEX PO) Take 1 capsule by mouth daily.    [provider]  bisacodyl (DULCOLAX) 10 MG suppository Place 10 mg rectally as needed for moderate constipation.    [provider]  carvedilol (COREG) 12.5 MG tablet Take 1 tablet (12.5 mg total) by mouth 2 (two) times daily. 03/05/21   Medina-Vargas, Monina C, NP  cholecalciferol (VITAMIN D3) 25 MCG (1000 UNIT) tablet Take 1,000 Units by mouth daily.    [provider]  cinacalcet (SENSIPAR) 30 MG tablet Take 1 tablet (30 mg total) by mouth daily. 03/05/21   Medina-Vargas, Monina C, NP  clopidogrel (PLAVIX) 75 MG tablet Take 1 tablet (75 mg total) by mouth daily. 03/05/21   Medina-Vargas, Monina C, NP  diclofenac Sodium (VOLTAREN) 1 % GEL Apply 1 application topically 4 (four) times daily as needed (pain). 08/15/20   [provider]  hydrOXYzine (ATARAX/VISTARIL) 25 MG tablet Take 1 tablet (25 mg total) by mouth 2 (two) times daily as needed for anxiety. 03/05/21   Medina-Vargas, Monina C, NP  insulin lispro protamine-lispro (HUMALOG 75/25 MIX) (75-25) 100 UNIT/ML SUSP injection Inject 30-40 Units into the skin See admin instructions. Inject 40  units into the skin morning and 30 units in the evening    [provider]  levothyroxine (SYNTHROID) 200 MCG tablet Take 1 tablet (200 mcg total) by mouth daily at 6 (six) AM. 03/05/21   Medina-Vargas, Jaymes Graff  C, NP  Magnesium Hydroxide (MILK OF MAGNESIA PO) Take 30 mLs by mouth as needed.    [provider]  magnesium oxide (MAG-OX) 400 (241.3 Mg) MG tablet Take 1 tablet (400 mg total) by mouth 2 (two) times daily. 01/14/20   Lavina Hamman, MD  metoCLOPramide (REGLAN) 5 MG tablet Take 1 tablet (5 mg total) by mouth 3 (three) times daily. 03/05/21   Medina-Vargas, Monina C, NP  metolazone (ZAROXOLYN) 5 MG tablet Take 1 tablet (5 mg total) by mouth 3 (three) times a week. Takes 5mg  on Monday, Wednesday and Friday 03/06/21   Medina-Vargas, Monina C, NP  Multiple Vitamin (MULTIVITAMIN WITH MINERALS) TABS tablet Take 1 tablet by mouth daily.    [provider]  oxyCODONE-acetaminophen (PERCOCET/ROXICET) 5-325 MG tablet Take 1 tablet by mouth every 6 (six) hours as needed for moderate pain or severe pain. 03/05/21   Medina-Vargas, Monina C, NP  pantoprazole (PROTONIX) 40 MG tablet Take 1 tablet (40 mg total) by mouth 2 (two) times daily before a meal for 60 days, THEN 1 tablet (40 mg total) daily. 03/05/21 07/03/21  Medina-Vargas, Monina C, NP  Polyethylene Glycol 3350 POWD 17 g by Does not apply route daily. Mix with 6-8 oz of fluid choice    [provider]  polyvinyl alcohol (LIQUIFILM TEARS) 1.4 % ophthalmic solution Place 2 drops into both eyes daily as needed for dry eyes.    [provider]  Potassium Chloride ER 20 MEQ TBCR Take 1 tablet by mouth 3 (three) times a week. Tuesday, Thursdays and Saturdays 03/06/21   Medina-Vargas, Monina C, NP  Sodium Phosphates (RA SALINE ENEMA RE) Place rectally as needed.    [provider]  torsemide (DEMADEX) 100 MG tablet Take 1 tablet (100 mg total) by mouth 2 (two) times daily. 03/05/21   Medina-Vargas, Monina C, NP   TRULICITY 1.5 CB/7.6EG SOPN Inject 1.5 mg into the skin once a week. Monday 03/05/21   Medina-Vargas, Monina C, NP    Allergies    Nebivolol, Ace inhibitors, Ace inhibitors, Morphine and related, and Morphine and related  Review of Systems   Review of Systems  Neurological:  Positive for weakness.  All other systems reviewed and are negative.  Physical Exam Updated Vital Signs BP (!) 126/45   Pulse 90   Temp (!) 101.1 F (38.4 C) (Oral)   Resp 18   Ht 5\' 7"  (1.702 m)   Wt 104.3 kg   SpO2 99%   BMI 36.02 kg/m   Physical Exam Vitals and nursing note reviewed.  Constitutional:      Appearance: She is well-developed.  HENT:     Head: Normocephalic and atraumatic.     Nose: Nose normal. No congestion or rhinorrhea.     Mouth/Throat:     Mouth: Mucous membranes are moist.     Pharynx: Oropharynx is clear.  Eyes:     Pupils: Pupils are equal, round, and reactive to light.  Cardiovascular:     Rate and Rhythm: Normal rate and regular rhythm.  Pulmonary:     Effort: No respiratory distress.     Breath sounds: No stridor.  Abdominal:     General: Abdomen is flat. There is no distension.  Musculoskeletal:     Cervical back: Normal range of motion.  Skin:    General: Skin is warm and dry.     Comments: Right leg is significantly larger than left and tender to palpation however her left leg is erythematous  and tender with warmth greater than the right leg.  Also has area of ecchymosis and bruising on the left leg.  Is intact.  Neurological:     General: No focal deficit present.     Mental Status: She is alert.    ED Results / Procedures / Treatments   Labs (all labs ordered are listed, but only abnormal results are displayed) Labs Reviewed  COMPREHENSIVE METABOLIC PANEL - Abnormal; Notable for the following components:      Result Value   Chloride 92 (*)    Glucose, Bld 145 (*)    BUN 160 (*)    Creatinine, Ser 2.90 (*)    Albumin 3.0 (*)    GFR, Estimated 17 (*)     All other components within normal limits  CBC WITH DIFFERENTIAL/PLATELET - Abnormal; Notable for the following components:   WBC 35.9 (*)    RBC 3.03 (*)    Hemoglobin 7.0 (*)    HCT 24.0 (*)    MCV 79.2 (*)    MCH 23.1 (*)    MCHC 29.2 (*)    RDW 21.9 (*)    All other components within normal limits  CULTURE, BLOOD (SINGLE)  URINE CULTURE  LACTIC ACID, PLASMA  PROTIME-INR  APTT  LACTIC ACID, PLASMA  CBC WITH DIFFERENTIAL/PLATELET  URINALYSIS, ROUTINE W REFLEX MICROSCOPIC  CK    EKG None  Radiology DG Chest Port 1 View  Result Date: 07/01/2021 CLINICAL DATA:  70 year old female with possible sepsis. EXAM: PORTABLE CHEST 1 VIEW COMPARISON:  Portable chest 02/15/2021 and earlier. FINDINGS: Portable AP semi upright view at 0417 hours. Mildly lower lung volumes. Stable cardiomegaly and mediastinal contours. Visualized tracheal air column is within normal limits. No pneumothorax, pulmonary edema, pleural effusion or consolidation. Vague chronic increased opacity in the right upper lobe is seen related to focal increased pleural fat or fatty pleural mass on a 2020 chest CT (benign). No acute pulmonary opacity. Degenerative changes at the right shoulder. No acute osseous abnormality identified. Paucity of bowel gas in the upper abdomen. IMPRESSION: No acute cardiopulmonary abnormality. Chronic cardiomegaly. Benign increased pleural fat or lipoma in the right upper lung. Electronically Signed   By: Genevie Ann M.D.   On: 07/01/2021 04:53    Procedures Procedures   Medications Ordered in ED Medications  vancomycin (VANCOREADY) IVPB 2000 mg/400 mL (2,000 mg Intravenous New Bag/Given 07/01/21 0518)  ceFEPIme (MAXIPIME) 2 g in sodium chloride 0.9 % 100 mL IVPB (0 g Intravenous Stopped 07/01/21 0515)  sodium chloride 0.9 % bolus 1,000 mL (0 mLs Intravenous Stopped 07/01/21 0704)    ED Course  I have reviewed the triage vital signs and the nursing notes.  Pertinent labs & imaging results  that were available during my care of the patient were reviewed by me and considered in my medical decision making (see chart for details).    MDM Rules/Calculators/A&P                         Significantly elevated white blood cell count suspicious for infection.  Suspect possible cellulitis of her leg versus other unrecognized infection.  Rest of her work-up is pretty unremarkable.  Her vital signs are stable and not alarming.  Already had antibiotics.  At this time patient will be admitted to the hospitalist for further work-up   Final Clinical Impression(s) / ED Diagnoses Final diagnoses:  None    Rx / DC Orders ED Discharge Orders  None        Takelia Urieta, Corene Cornea, MD 07/02/21 [phone]

## 2021-07-01 NOTE — ED Triage Notes (Signed)
Pt is able to answer questions, move extremities, rt leg and foot is swollen, pt said she does not feel good

## 2021-07-01 NOTE — Progress Notes (Signed)
OT Cancellation Note  Patient Details Name: [name] [name] MRN: [id number] DOB: 11/13/1950   Cancelled Treatment:    Reason Eval/Treat Not Completed: Other (comment) (Pt eating upon arrival. Pt would not answer OT's PLOF questions, declined using a washcloth, and kept her hand over her eyes despite encouragement to participate. RN aware. VSS on RA however pt's breathing was labored. OT evaluation to f/u as time and pt allow.)  Tanashia Ciesla A Jandiel Magallanes 07/01/2021, 1:56 PM

## 2021-07-01 NOTE — Progress Notes (Signed)
Pharmacy Antibiotic Note  [name] [name] is a 70 y.o. female admitted on 07/01/2021 with sepsis.  Pharmacy has been consulted for cefepime and vancomycin dosing.  Patient with a history of chronic diastolic CHF; chronic pain; COPD; DM; HTN; HLD; hypothyroidism; OSA not on CPAP; obesity; stage 4 CKD; and PVD. Patient presenting with RLE swelling.  SCr 2.9 which is near baseline. WBC 35.9; LA 1.5; T afeb; RR 18; HR 94  Plan: Metronidazole per MD Cefepime 2g Q24hr Vancomycin 2000 mg once then 1000 mg q48hr (eAUC 505) unless change in renal function  Trend WBC, Fever, clinical status F/u Cultures De-escalate when able  Height: 5\' 7"  (170.2 cm) Weight: 104.3 kg (230 lb) IBW/kg (Calculated) : 61.6  Temp (24hrs), Avg:99.4 F (37.4 C), Min:97.6 F (36.4 C), Max:101.1 F (38.4 C)  Recent Labs  Lab 07/01/21 0401 07/01/21 0650  WBC  --  35.9*  CREATININE 2.90*  --   LATICACIDVEN 1.5  --     Estimated Creatinine Clearance: 22.4 mL/min (A) (by C-G formula based on SCr of 2.9 mg/dL (H)).    Allergies  Allergen Reactions   Nebivolol Swelling    Chest pain (reaction to Bystolic)   Ace Inhibitors Swelling and Other (See Comments)    Tongue swell   Ace Inhibitors Swelling   Morphine And Related Itching   Morphine And Related Itching   Antimicrobials this admission: metronidazole 9/24 >>  Cefepime 9/24 >> vancomycin 9/24 >>   Microbiology results: Pending  Thank you for allowing pharmacy to be a part of this patient's care.  Lorelei Pont, PharmD, BCPS 07/01/2021 10:39 AM ED Clinical Pharmacist -  [phone]

## 2021-07-02 ENCOUNTER — Observation Stay (HOSPITAL_COMMUNITY): Payer: 59

## 2021-07-02 DIAGNOSIS — I5032 Chronic diastolic (congestive) heart failure: Secondary | ICD-10-CM

## 2021-07-02 DIAGNOSIS — K219 Gastro-esophageal reflux disease without esophagitis: Secondary | ICD-10-CM | POA: Diagnosis present

## 2021-07-02 DIAGNOSIS — D631 Anemia in chronic kidney disease: Secondary | ICD-10-CM | POA: Diagnosis not present

## 2021-07-02 DIAGNOSIS — F17210 Nicotine dependence, cigarettes, uncomplicated: Secondary | ICD-10-CM | POA: Diagnosis present

## 2021-07-02 DIAGNOSIS — I130 Hypertensive heart and chronic kidney disease with heart failure and stage 1 through stage 4 chronic kidney disease, or unspecified chronic kidney disease: Secondary | ICD-10-CM | POA: Diagnosis not present

## 2021-07-02 DIAGNOSIS — E876 Hypokalemia: Secondary | ICD-10-CM

## 2021-07-02 DIAGNOSIS — L03116 Cellulitis of left lower limb: Secondary | ICD-10-CM

## 2021-07-02 DIAGNOSIS — D500 Iron deficiency anemia secondary to blood loss (chronic): Secondary | ICD-10-CM | POA: Diagnosis not present

## 2021-07-02 DIAGNOSIS — Z8616 Personal history of COVID-19: Secondary | ICD-10-CM | POA: Diagnosis not present

## 2021-07-02 DIAGNOSIS — N189 Chronic kidney disease, unspecified: Secondary | ICD-10-CM | POA: Diagnosis not present

## 2021-07-02 DIAGNOSIS — E1165 Type 2 diabetes mellitus with hyperglycemia: Secondary | ICD-10-CM | POA: Diagnosis present

## 2021-07-02 DIAGNOSIS — Z6836 Body mass index (BMI) 36.0-36.9, adult: Secondary | ICD-10-CM | POA: Diagnosis not present

## 2021-07-02 DIAGNOSIS — E890 Postprocedural hypothyroidism: Secondary | ICD-10-CM | POA: Diagnosis present

## 2021-07-02 DIAGNOSIS — E1122 Type 2 diabetes mellitus with diabetic chronic kidney disease: Secondary | ICD-10-CM | POA: Diagnosis not present

## 2021-07-02 DIAGNOSIS — Z90710 Acquired absence of both cervix and uterus: Secondary | ICD-10-CM | POA: Diagnosis not present

## 2021-07-02 DIAGNOSIS — N184 Chronic kidney disease, stage 4 (severe): Secondary | ICD-10-CM | POA: Diagnosis not present

## 2021-07-02 DIAGNOSIS — Z7902 Long term (current) use of antithrombotics/antiplatelets: Secondary | ICD-10-CM | POA: Diagnosis not present

## 2021-07-02 DIAGNOSIS — Z8249 Family history of ischemic heart disease and other diseases of the circulatory system: Secondary | ICD-10-CM | POA: Diagnosis not present

## 2021-07-02 DIAGNOSIS — K5521 Angiodysplasia of colon with hemorrhage: Secondary | ICD-10-CM | POA: Diagnosis not present

## 2021-07-02 DIAGNOSIS — I5043 Acute on chronic combined systolic (congestive) and diastolic (congestive) heart failure: Secondary | ICD-10-CM | POA: Diagnosis not present

## 2021-07-02 DIAGNOSIS — L03115 Cellulitis of right lower limb: Secondary | ICD-10-CM | POA: Diagnosis present

## 2021-07-02 DIAGNOSIS — Z79890 Hormone replacement therapy (postmenopausal): Secondary | ICD-10-CM | POA: Diagnosis not present

## 2021-07-02 DIAGNOSIS — A419 Sepsis, unspecified organism: Secondary | ICD-10-CM | POA: Diagnosis not present

## 2021-07-02 DIAGNOSIS — E1151 Type 2 diabetes mellitus with diabetic peripheral angiopathy without gangrene: Secondary | ICD-10-CM | POA: Diagnosis not present

## 2021-07-02 DIAGNOSIS — J441 Chronic obstructive pulmonary disease with (acute) exacerbation: Secondary | ICD-10-CM | POA: Diagnosis not present

## 2021-07-02 DIAGNOSIS — Z833 Family history of diabetes mellitus: Secondary | ICD-10-CM | POA: Diagnosis not present

## 2021-07-02 LAB — CBC
HCT: 20 % — ABNORMAL LOW (ref 36.0–46.0)
Hemoglobin: 6 g/dL — CL (ref 12.0–15.0)
MCH: 23.1 pg — ABNORMAL LOW (ref 26.0–34.0)
MCHC: 30 g/dL (ref 30.0–36.0)
MCV: 76.9 fL — ABNORMAL LOW (ref 80.0–100.0)
Platelets: 290 10*3/uL (ref 150–400)
RBC: 2.6 MIL/uL — ABNORMAL LOW (ref 3.87–5.11)
RDW: 21.3 % — ABNORMAL HIGH (ref 11.5–15.5)
WBC: 19.9 10*3/uL — ABNORMAL HIGH (ref 4.0–10.5)
nRBC: 0 % (ref 0.0–0.2)

## 2021-07-02 LAB — URINALYSIS, ROUTINE W REFLEX MICROSCOPIC
Bilirubin Urine: NEGATIVE
Glucose, UA: NEGATIVE mg/dL
Hgb urine dipstick: NEGATIVE
Ketones, ur: NEGATIVE mg/dL
Leukocytes,Ua: NEGATIVE
Nitrite: NEGATIVE
Protein, ur: NEGATIVE mg/dL
Specific Gravity, Urine: 1.01 (ref 1.005–1.030)
pH: 5 (ref 5.0–8.0)

## 2021-07-02 LAB — BASIC METABOLIC PANEL
Anion gap: 12 (ref 5–15)
BUN: 149 mg/dL — ABNORMAL HIGH (ref 8–23)
CO2: 29 mmol/L (ref 22–32)
Calcium: 8.9 mg/dL (ref 8.9–10.3)
Chloride: 95 mmol/L — ABNORMAL LOW (ref 98–111)
Creatinine, Ser: 2.63 mg/dL — ABNORMAL HIGH (ref 0.44–1.00)
GFR, Estimated: 19 mL/min — ABNORMAL LOW (ref >60)
Glucose, Bld: 191 mg/dL — ABNORMAL HIGH (ref 70–99)
Potassium: 2.7 mmol/L — CL (ref 3.5–5.1)
Sodium: 136 mmol/L (ref 135–145)

## 2021-07-02 LAB — GLUCOSE, CAPILLARY
Glucose-Capillary: 173 mg/dL — ABNORMAL HIGH (ref 70–99)
Glucose-Capillary: 161 mg/dL — ABNORMAL HIGH (ref 70–99)
Glucose-Capillary: 148 mg/dL — ABNORMAL HIGH (ref 70–99)
Glucose-Capillary: 199 mg/dL — ABNORMAL HIGH (ref 70–99)

## 2021-07-02 LAB — RAPID URINE DRUG SCREEN, HOSP PERFORMED
Amphetamines: NOT DETECTED
Barbiturates: NOT DETECTED
Benzodiazepines: NOT DETECTED
Cocaine: NOT DETECTED
Opiates: NOT DETECTED
Tetrahydrocannabinol: NOT DETECTED

## 2021-07-02 LAB — HEMOGLOBIN AND HEMATOCRIT, BLOOD
HCT: 27 % — ABNORMAL LOW (ref 36.0–46.0)
Hemoglobin: 8.5 g/dL — ABNORMAL LOW (ref 12.0–15.0)

## 2021-07-02 LAB — PREPARE RBC (CROSSMATCH)

## 2021-07-02 MED ORDER — FUROSEMIDE 10 MG/ML IJ SOLN
20.0000 mg | Freq: Once | INTRAMUSCULAR | Status: AC
  Administered 2021-07-02: 20 mg via INTRAVENOUS
  Filled 2021-07-02: qty 2

## 2021-07-02 MED ORDER — GUAIFENESIN-DM 100-10 MG/5ML PO SYRP
10.0000 mL | ORAL_SOLUTION | Freq: Four times a day (QID) | ORAL | Status: DC | PRN
  Administered 2021-07-02 – 2021-07-09 (×3): 10 mL via ORAL
  Filled 2021-07-02 (×3): qty 10

## 2021-07-02 MED ORDER — IPRATROPIUM-ALBUTEROL 0.5-2.5 (3) MG/3ML IN SOLN
3.0000 mL | Freq: Once | RESPIRATORY_TRACT | Status: AC
  Administered 2021-07-02: 3 mL via RESPIRATORY_TRACT
  Filled 2021-07-02: qty 3

## 2021-07-02 MED ORDER — OXYCODONE HCL 5 MG PO TABS
10.0000 mg | ORAL_TABLET | Freq: Four times a day (QID) | ORAL | Status: AC | PRN
  Administered 2021-07-02 – 2021-07-03 (×2): 10 mg via ORAL
  Filled 2021-07-02 (×2): qty 2

## 2021-07-02 MED ORDER — OXYCODONE HCL 5 MG PO TABS: 10.0000 mg | ORAL_TABLET | Freq: Four times a day (QID) | ORAL | Status: DC | PRN

## 2021-07-02 MED ORDER — OXYCODONE HCL 5 MG PO TABS
10.0000 mg | ORAL_TABLET | Freq: Four times a day (QID) | ORAL | Status: AC | PRN
  Administered 2021-07-02 (×2): 10 mg via ORAL
  Filled 2021-07-02 (×2): qty 2

## 2021-07-02 MED ORDER — POTASSIUM CHLORIDE CRYS ER 20 MEQ PO TBCR
40.0000 meq | EXTENDED_RELEASE_TABLET | Freq: Four times a day (QID) | ORAL | Status: AC
Start: 2021-07-02 — End: 2021-07-02
  Administered 2021-07-02 (×2): 40 meq via ORAL
  Filled 2021-07-02 (×2): qty 2

## 2021-07-02 MED ORDER — IPRATROPIUM-ALBUTEROL 0.5-2.5 (3) MG/3ML IN SOLN
3.0000 mL | Freq: Four times a day (QID) | RESPIRATORY_TRACT | Status: DC | PRN
  Administered 2021-07-03 – 2021-07-04 (×2): 3 mL via RESPIRATORY_TRACT
  Filled 2021-07-02 (×2): qty 3

## 2021-07-02 MED ORDER — SODIUM CHLORIDE 0.9% IV SOLUTION: Freq: Once | INTRAVENOUS | Status: AC

## 2021-07-02 NOTE — Progress Notes (Signed)
PT Cancellation Note  Patient Details Name: [name] [name] MRN: [id number] DOB: 1950/11/27   Cancelled Treatment:    Reason Eval/Treat Not Completed: Medical issues which prohibited therapy. Hgb 6.0. Pt scheduled to receive 2 untis PRBC.    [name] [name] 07/02/2021, 8:04 AM  Lorrin Goodell, PT  Office # [phone] Pager [phone]

## 2021-07-02 NOTE — Progress Notes (Signed)
Date and time results received: 07/02/21 0933  Test: K Critical Value: 2.7  Name of Provider Notified: Dr. Maryland Pink  Orders Received? Or Actions Taken?:  Md aware and at bedside. Notified in person.

## 2021-07-02 NOTE — Progress Notes (Signed)
[facility] PROGRESS NOTE   [name] [name] VVO:[id number] DOB: [birth year] DOA: 07/01/2021  PCP: [name] Ebbs, MD  Brief History/Interval Summary:  70 y.o. female with medical history significant of chronic diastolic CHF; chronic pain; COPD; DM; HTN; HLD; hypothyroidism; OSA not on CPAP; obesity; stage 4 CKD; and PVD presented with complains of pain in both her legs and feet.  She was also noted to have fever.  There was concern for cellulitis of her lower extremity.  She was hospitalized for further management.    Consultants: None  Procedures: None    Subjective/Interval History: Patient complains of pain in both her legs so but perhaps the left leg is hurting more than the right.  She apparently fell a few weeks ago resulting in a bruise over her left lower leg.  Following which she noticed some redness around that area.  Denies any chest pain shortness of breath nausea or vomiting.     Assessment/Plan:  Left lower extremity cellulitis with sepsis Patient had leukocytosis fever tachycardia and tachypnea.  She has evidence for cellulitis of the left lower extremity evidenced by erythema and warmth.  Lower extremity Doppler studies did not show any DVTs.  Patient placed on broad-spectrum antibiotic coverage including vancomycin and cefepime and metronidazole which will be continued for now.  Her WBC was elevated at 35.9 on admission.  Improved to 19.9 today.  Procalcitonin elevated at 3.0.  No evidence for compartment syndrome at this time.  TSH 2.6.  Follow-up on blood cultures.  Check CBC daily for now.  Monitor fever trends.  No clear evidence to do imaging studies of the left lower extremity apart from the Doppler studies which have already been done.  Chronic kidney disease stage IV/hypokalemia Renal function seems to be close to baseline.  Monitor urine output.  Noted to have significantly BUN. Potassium will be repleted.  Anemia of chronic kidney disease No evidence  of overt bleeding.  She does mention dark stool but has not seen any red blood in her stool.  We will check stool for occult blood.  Review of her previous hemoglobin values show that she has had anemia which has been longstanding.  However it appears that for a brief while it had improved to 10-12.  But then it did go back down to 7.3 in August.  So this appears to be close to baseline.  Drop in hemoglobin is likely dilutional.  2 units of PRBC has been ordered.  Chronic pain syndrome She appears to have chronic pain at baseline.  Continue home medications.  Chronic combined systolic and diastolic CHF Echocardiogram done in May showed EF to be 45 to 50% with grade 1 diastolic dysfunction. Seems to be reasonably well compensated at this time.  Metolazone and Demadex are currently on hold.  Patient is on carvedilol which is being continued.  History of peripheral vascular disease Continue Plavix  Essential hypertension Monitor blood pressures closely.  Diabetes mellitus type 2, controlled Monitor CBGs.  SSI.  Holding Trulicity. Patient was on 75/25 insulin prior to admission.  Ideally will switch to 70/30 but will see how her CBG trends are today.  Hypothyroidism TSH was normal.  Continue levothyroxine.  Obstructive sleep apnea Not on CPAP  Wheezing Noted to have wheezing on examination today.  Patient admits to smoking cigarettes on a daily basis.  Apparently has been diagnosed with COPD previously.  Chest x-ray was repeated and does not show any new findings.  Continue to monitor for now.  Nebulizer treatments have been ordered.  Class III obesity Estimated body mass index is 36.02 kg/m as calculated from the following:   Height as of this encounter: 5\' 7"  (1.702 m).   Weight as of this encounter: 104.3 kg.    DVT Prophylaxis: Subcutaneous heparin Code Status: Full code Family Communication: Discussed with patient Disposition Plan: Hopefully return home when improved.  Will  need PT and OT evaluation eventually.  Status is: Inpatient  Remains inpatient appropriate because:IV treatments appropriate due to intensity of illness or inability to take PO and Inpatient level of care appropriate due to severity of illness  Dispo: The patient is from: Home              Anticipated d/c is to: Home              Patient currently is not medically stable to d/c.   Difficult to place patient No      Medications: Scheduled:  allopurinol  100 mg Oral Daily   carvedilol  12.5 mg Oral BID   cinacalcet  30 mg Oral Daily   clopidogrel  75 mg Oral Daily   heparin  5,000 Units Subcutaneous Q12H   insulin aspart  0-9 Units Subcutaneous TID WC   levothyroxine  200 mcg Oral Q0600   metoCLOPramide  5 mg Oral TID   pantoprazole  40 mg Oral Daily   polyethylene glycol  17 g Oral Daily   potassium chloride  40 mEq Oral Q6H   sodium chloride flush  3 mL Intravenous Q12H   Continuous:  ceFEPime (MAXIPIME) IV 200 mL/hr at 07/02/21 0413   lactated ringers Stopped (07/02/21 0333)   metronidazole Stopped (07/01/21 2230)   [START ON 07/03/2021] vancomycin     RSW:[id number] **OR** acetaminophen, calcium carbonate (dosed in mg elemental calcium), camphor-menthol **AND** hydrOXYzine, docusate sodium, feeding supplement (NEPRO CARB STEADY), ipratropium-albuterol, ondansetron **OR** ondansetron (ZOFRAN) IV, polyvinyl alcohol, sorbitol, zolpidem  Antibiotics: Anti-infectives (From admission, onward)    Start     Dose/Rate Route Frequency Ordered Stop   07/03/21 0500  vancomycin (VANCOCIN) IVPB 1000 mg/200 mL premix        1,000 mg 200 mL/hr over 60 Minutes Intravenous Every 48 hours 07/01/21 1042 07/09/21 0459   07/02/21 0400  ceFEPIme (MAXIPIME) 2 g in sodium chloride 0.9 % 100 mL IVPB        2 g 200 mL/hr over 30 Minutes Intravenous Every 24 hours 07/01/21 1042 07/08/21 0359   07/01/21 1015  metroNIDAZOLE (FLAGYL) IVPB 500 mg        500 mg 100 mL/hr over 60 Minutes  Intravenous Every 12 hours 07/01/21 1014 07/08/21 1014   07/01/21 0415  vancomycin (VANCOREADY) IVPB 2000 mg/400 mL        2,000 mg 200 mL/hr over 120 Minutes Intravenous  Once 07/01/21 0400 07/01/21 0730   07/01/21 0415  ceFEPIme (MAXIPIME) 2 g in sodium chloride 0.9 % 100 mL IVPB        2 g 200 mL/hr over 30 Minutes Intravenous  Once 07/01/21 0400 07/01/21 0515       Objective:  Vital Signs  Vitals:   07/02/21 0844 07/02/21 1045 07/02/21 1109 07/02/21 1138  BP: (!) 144/73  135/74 138/72  Pulse: 71  70 72  Resp: 17  18 17   Temp: 98.5 F (36.9 C)  98.7 F (37.1 C) 98.6 F (37 C)  TempSrc: Oral  Oral   SpO2: 97% 94% 98%   Weight:  Height:        Intake/Output Summary (Last 24 hours) at 07/02/2021 1304 Last data filed at 07/02/2021 1129 Gross per 24 hour  Intake 2386.88 ml  Output 725 ml  Net 1661.88 ml   Filed Weights   07/01/21 0340  Weight: 104.3 kg    General appearance: Awake alert.  In no distress Resp: Clear to auscultation bilaterally.  Normal effort Cardio: S1-S2 is normal regular.  No S3-S4.  No rubs murmurs or bruit GI: Abdomen is soft.  Nontender nondistended.  Bowel sounds are present normal.  No masses organomegaly Extremities: Noted to have bruising on the anterior aspect of the left lower leg superiorly just below the knee.  Has good range of motion of the knee.  There is erythema in the rest of the lower leg with warmth.  Good peripheral pulses. Neurologic: Alert and oriented x3.  No focal neurological deficits.    Lab Results:  Data Reviewed: I have personally reviewed following labs and imaging studies  CBC: Recent Labs  Lab 07/01/21 0650 07/02/21 0423  WBC 35.9* 19.9*  NEUTROABS 33.5*  --   HGB 7.0* 6.0*  HCT 24.0* 20.0*  MCV 79.2* 76.9*  PLT 321 086    Basic Metabolic Panel: Recent Labs  Lab 07/01/21 0401 07/02/21 0622  NA 135 136  K 3.5 2.7*  CL 92* 95*  CO2 29 29  GLUCOSE 145* 191*  BUN 160* 149*  CREATININE 2.90*  2.63*  CALCIUM 9.2 8.9    GFR: Estimated Creatinine Clearance: 24.7 mL/min (A) (by C-G formula based on SCr of 2.63 mg/dL (H)).  Liver Function Tests: Recent Labs  Lab 07/01/21 0401  AST 22  ALT 14  ALKPHOS 70  BILITOT 0.6  PROT 6.6  ALBUMIN 3.0*     Coagulation Profile: Recent Labs  Lab 07/01/21 0401  INR 1.0    Cardiac Enzymes: Recent Labs  Lab 07/01/21 1829  CKTOTAL 70     CBG: Recent Labs  Lab 07/01/21 1231 07/01/21 1643 07/01/21 2054 07/02/21 0849  GLUCAP 166* 198* 158* 173*      Thyroid Function Tests: Recent Labs    07/01/21 1829  TSH 2.690    Recent Results (from the past 240 hour(s))  SARS CORONAVIRUS 2 (TAT 6-24 HRS) Nasopharyngeal Nasopharyngeal Swab     Status: None   Collection Time: 07/01/21 10:26 AM   Specimen: Nasopharyngeal Swab  Result Value Ref Range Status   SARS Coronavirus 2 NEGATIVE NEGATIVE Final    Comment: (NOTE) SARS-CoV-2 target nucleic acids are NOT DETECTED.  The SARS-CoV-2 RNA is generally detectable in upper and lower respiratory specimens during the acute phase of infection. Negative results do not preclude SARS-CoV-2 infection, do not rule out co-infections with other pathogens, and should not be used as the sole basis for treatment or other patient management decisions. Negative results must be combined with clinical observations, patient history, and epidemiological information. The expected result is Negative.  Fact Sheet for Patients: SugarRoll.be  Fact Sheet for Healthcare Providers: https://www.woods-mathews.com/  This test is not yet approved or cleared by the [place] FDA and  has been authorized for detection and/or diagnosis of SARS-CoV-2 by FDA under an Emergency Use Authorization (EUA). This EUA will remain  in effect (meaning this test can be used) for the duration of the COVID-19 declaration under Se ction 564(b)(1) of the Act, 21  U.S.C. section 360bbb-3(b)(1), unless the authorization is terminated or revoked sooner.  Performed at [facility], [place]  [address] [place], [place] [address]       Radiology Studies: DG CHEST PORT 1 VIEW  Result Date: 07/02/2021 CLINICAL DATA:  70 year old female with wheezing. EXAM: PORTABLE CHEST 1 VIEW COMPARISON:  Portable chest 07/01/2021 and earlier. FINDINGS: Portable AP semi upright view at 0913 hours. Stable cardiomegaly and mediastinal contours. Larger lung volumes. Visualized tracheal air column is within normal limits. No pneumothorax or pleural effusion. Pulmonary vascularity appears stable without acute edema. Hazy opacity from chronic right anterior upper lung increased pleural fat again noted. No acute pulmonary opacity. Stable visualized osseous structures. IMPRESSION: Stable cardiomegaly. No acute cardiopulmonary abnormality. Electronically Signed   By: Genevie Ann M.D.   On: 07/02/2021 09:23   DG Chest Port 1 View  Result Date: 07/01/2021 CLINICAL DATA:  70 year old female with possible sepsis. EXAM: PORTABLE CHEST 1 VIEW COMPARISON:  Portable chest 02/15/2021 and earlier. FINDINGS: Portable AP semi upright view at 0417 hours. Mildly lower lung volumes. Stable cardiomegaly and mediastinal contours. Visualized tracheal air column is within normal limits. No pneumothorax, pulmonary edema, pleural effusion or consolidation. Vague chronic increased opacity in the right upper lobe is seen related to focal increased pleural fat or fatty pleural mass on a 2020 chest CT (benign). No acute pulmonary opacity. Degenerative changes at the right shoulder. No acute osseous abnormality identified. Paucity of bowel gas in the upper abdomen. IMPRESSION: No acute cardiopulmonary abnormality. Chronic cardiomegaly. Benign increased pleural fat or lipoma in the right upper lung. Electronically Signed   By: Genevie Ann M.D.   On: 07/01/2021 04:53   VAS [name] LOWER EXTREMITY VENOUS (DVT) ([facility] and [facility]  7a-7p)  Result Date: 07/01/2021  Lower Venous DVT Study Patient Name:  [name] [name]  Date of Exam:   07/01/2021 Medical Rec #: [id number]          Accession #:    [id number] Date of Birth: 05-26-1951           Patient Gender: F Patient Age:   70 years Exam Location:  [facility] Procedure:      VAS [name] LOWER EXTREMITY VENOUS (DVT) Referring Phys: KRISTIE HORTON --------------------------------------------------------------------------------  Indications: Edema.  Limitations: Patient unable to reposition, pain/intolerance to probe pressure, body habitus/tissue properties, acoustic shadowing due to overlying arterial calcification. Comparison Study: 04-27-2021 Most recent prior waas negative for DVT. Performing Technologist: Darlin Coco RDMS, RVT  Examination Guidelines: A complete evaluation includes B-mode imaging, spectral Doppler, color Doppler, and power Doppler as needed of all accessible portions of each vessel. Bilateral testing is considered an integral part of a complete examination. Limited examinations for reoccurring indications may be performed as noted. The reflux portion of the exam is performed with the patient in reverse Trendelenburg.  +---------+---------------+---------+-----------+----------+--------------+ RIGHT    CompressibilityPhasicitySpontaneityPropertiesThrombus Aging +---------+---------------+---------+-----------+----------+--------------+ CFV      Full           Yes      Yes                                 +---------+---------------+---------+-----------+----------+--------------+ SFJ      Full                                                        +---------+---------------+---------+-----------+----------+--------------+ FV Prox  Full                                                        +---------+---------------+---------+-----------+----------+--------------+  FV Mid   Full                                                         +---------+---------------+---------+-----------+----------+--------------+ FV Distal               Yes      Yes                                 +---------+---------------+---------+-----------+----------+--------------+ PFV      Full                                                        +---------+---------------+---------+-----------+----------+--------------+ POP      Full           Yes      Yes                                 +---------+---------------+---------+-----------+----------+--------------+ PTV                                                   Not visualized +---------+---------------+---------+-----------+----------+--------------+ PERO                                                  Not visualized +---------+---------------+---------+-----------+----------+--------------+   +---------+---------------+---------+-----------+----------+--------------+ LEFT     CompressibilityPhasicitySpontaneityPropertiesThrombus Aging +---------+---------------+---------+-----------+----------+--------------+ CFV      Full           Yes      Yes                                 +---------+---------------+---------+-----------+----------+--------------+ SFJ      Full                                                        +---------+---------------+---------+-----------+----------+--------------+ FV Prox  Full                                                        +---------+---------------+---------+-----------+----------+--------------+ FV Mid   Full                                                        +---------+---------------+---------+-----------+----------+--------------+ FV Distal  Yes      Yes                                 +---------+---------------+---------+-----------+----------+--------------+ PFV      Full                                                         +---------+---------------+---------+-----------+----------+--------------+ POP      Full           Yes      Yes                                 +---------+---------------+---------+-----------+----------+--------------+ PTV      Full                                                        +---------+---------------+---------+-----------+----------+--------------+ PERO     Full                                                        +---------+---------------+---------+-----------+----------+--------------+     Summary: RIGHT: - There is no evidence of deep vein thrombosis in the lower extremity.  - No cystic structure found in the popliteal fossa.  - Incidental: Heavy arterial calcification with monophasic flow observed throughout lower extremity.  LEFT: - There is no evidence of deep vein thrombosis in the lower extremity.  - No cystic structure found in the popliteal fossa.  - Incidental: Heavy arterial calcification with monophasic flow observed throughout lower extremity.  *See table(s) above for measurements and observations. Electronically signed by Servando Snare MD on 07/01/2021 at 5:22:34 PM.    Final        LOS: 0 days   [facility] Pager on [email or web address]  07/02/2021, 1:04 PM

## 2021-07-02 NOTE — Plan of Care (Signed)

## 2021-07-02 NOTE — Plan of Care (Signed)
  Problem: Health Behavior/Discharge Planning: Goal: Ability to manage health-related needs will improve Outcome: Progressing   Problem: Clinical Measurements: Goal: Ability to maintain clinical measurements within normal limits will improve Outcome: Progressing   

## 2021-07-02 NOTE — Progress Notes (Signed)
Patient is an exteremly difficult IV start with multiple failed attempts. IV started leaking from insertion site however, IV team was able to get another IV started. Loss of IV access did cause some delay to blood transfusion and flagyl infusion. Pharmacy messaged about retime flagyl to after blood transfusion finished. RN taking over care of patient also made aware.

## 2021-07-03 DIAGNOSIS — N189 Chronic kidney disease, unspecified: Secondary | ICD-10-CM | POA: Diagnosis not present

## 2021-07-03 DIAGNOSIS — L03116 Cellulitis of left lower limb: Secondary | ICD-10-CM | POA: Diagnosis not present

## 2021-07-03 DIAGNOSIS — D631 Anemia in chronic kidney disease: Secondary | ICD-10-CM

## 2021-07-03 DIAGNOSIS — A419 Sepsis, unspecified organism: Secondary | ICD-10-CM | POA: Diagnosis not present

## 2021-07-03 LAB — BASIC METABOLIC PANEL
Anion gap: 10 (ref 5–15)
BUN: 141 mg/dL — ABNORMAL HIGH (ref 8–23)
CO2: 29 mmol/L (ref 22–32)
Calcium: 8.5 mg/dL — ABNORMAL LOW (ref 8.9–10.3)
Chloride: 96 mmol/L — ABNORMAL LOW (ref 98–111)
Creatinine, Ser: 2.58 mg/dL — ABNORMAL HIGH (ref 0.44–1.00)
GFR, Estimated: 19 mL/min — ABNORMAL LOW (ref >60)
Glucose, Bld: 158 mg/dL — ABNORMAL HIGH (ref 70–99)
Potassium: 3.7 mmol/L (ref 3.5–5.1)
Sodium: 135 mmol/L (ref 135–145)

## 2021-07-03 LAB — GLUCOSE, CAPILLARY
Glucose-Capillary: 197 mg/dL — ABNORMAL HIGH (ref 70–99)
Glucose-Capillary: 182 mg/dL — ABNORMAL HIGH (ref 70–99)
Glucose-Capillary: 153 mg/dL — ABNORMAL HIGH (ref 70–99)
Glucose-Capillary: 176 mg/dL — ABNORMAL HIGH (ref 70–99)

## 2021-07-03 LAB — CBC
HCT: 25.9 % — ABNORMAL LOW (ref 36.0–46.0)
Hemoglobin: 8.1 g/dL — ABNORMAL LOW (ref 12.0–15.0)
MCH: 24.8 pg — ABNORMAL LOW (ref 26.0–34.0)
MCHC: 31.3 g/dL (ref 30.0–36.0)
MCV: 79.4 fL — ABNORMAL LOW (ref 80.0–100.0)
Platelets: 258 10*3/uL (ref 150–400)
RBC: 3.26 MIL/uL — ABNORMAL LOW (ref 3.87–5.11)
RDW: 20 % — ABNORMAL HIGH (ref 11.5–15.5)
WBC: 14.8 10*3/uL — ABNORMAL HIGH (ref 4.0–10.5)
nRBC: 0 % (ref 0.0–0.2)

## 2021-07-03 LAB — BPAM RBC
Blood Product Expiration Date: 202210172359
Blood Product Expiration Date: 202210172359
ISSUE DATE / TIME: 202209251116
ISSUE DATE / TIME: 202209251555
Unit Type and Rh: 5100
Unit Type and Rh: 5100

## 2021-07-03 LAB — URINE CULTURE: Culture: NO GROWTH

## 2021-07-03 LAB — TYPE AND SCREEN
ABO/RH(D): O POS
Antibody Screen: NEGATIVE
Unit division: 0
Unit division: 0

## 2021-07-03 MED ORDER — POTASSIUM CHLORIDE CRYS ER 20 MEQ PO TBCR
40.0000 meq | EXTENDED_RELEASE_TABLET | Freq: Once | ORAL | Status: AC
  Administered 2021-07-03: 40 meq via ORAL
  Filled 2021-07-03: qty 2

## 2021-07-03 MED ORDER — ADULT MULTIVITAMIN W/MINERALS CH
1.0000 | ORAL_TABLET | Freq: Every day | ORAL | Status: DC
  Administered 2021-07-04 – 2021-07-10 (×7): 1 via ORAL
  Filled 2021-07-03 (×7): qty 1

## 2021-07-03 MED ORDER — GLUCERNA SHAKE PO LIQD
237.0000 mL | Freq: Two times a day (BID) | ORAL | Status: DC
Start: 2021-07-04 — End: 2021-07-03

## 2021-07-03 MED ORDER — ENSURE MAX PROTEIN PO LIQD
11.0000 [oz_av] | Freq: Every day | ORAL | Status: DC
  Administered 2021-07-03 – 2021-07-09 (×6): 11 [oz_av] via ORAL

## 2021-07-03 MED ORDER — GLUCERNA SHAKE PO LIQD
237.0000 mL | Freq: Two times a day (BID) | ORAL | Status: DC
  Administered 2021-07-04: 237 mL via ORAL

## 2021-07-03 MED ORDER — OXYCODONE HCL 5 MG PO TABS
5.0000 mg | ORAL_TABLET | Freq: Four times a day (QID) | ORAL | Status: DC | PRN
  Administered 2021-07-03 – 2021-07-11 (×17): 5 mg via ORAL
  Filled 2021-07-03 (×17): qty 1

## 2021-07-03 MED ORDER — ENSURE MAX PROTEIN PO LIQD: 11.0000 [oz_av] | Freq: Every day | ORAL | Status: DC

## 2021-07-03 NOTE — Evaluation (Signed)
Occupational Therapy Evaluation Patient Details Name: [name] [name] MRN: [id number] DOB: 1951-05-14 Today's Date: 07/03/2021   History of Present Illness Pt is a 70 yo female admitted for cellulitis of the LLE.  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   Clinical Impression   Pt admitted with the above diagnosis and has the deficits outlined below. Pt would benefit from cont OT to increase independence and safety with adls on her feet and increase safety with adl transfers. Pt has a caregiver 6x a week that assists with adls. Pt has stated she is alone 3-4 hours a day.  Ideally pt would have 24 hour assist for the first few weeks at home.  Feel, if pain is controlled, pt could d/c home with HHOT if pt has 24 hour assist.  Will continue with focus on adls in standing.       Recommendations for follow up therapy are one component of a multi-disciplinary discharge planning process, led by the attending physician.  Recommendations may be updated based on patient status, additional functional criteria and insurance authorization.   Follow Up Recommendations  Home health OT;Supervision/Assistance - 24 hour    Equipment Recommendations  None recommended by OT    Recommendations for Other Services       Precautions / Restrictions Precautions Precautions: Fall Restrictions Weight Bearing Restrictions: No      Mobility Bed Mobility               General bed mobility comments: pt in chair on arrival    Transfers Overall transfer level: Needs assistance Equipment used: Rolling walker (2 wheeled) Transfers: Sit to/from [employer] Transfers Sit to Stand: Mod assist Stand pivot transfers: Min assist       General transfer comment: Pt required assist to power up and cues for hand placement.    Balance Overall balance assessment: Needs assistance Sitting-balance support: Bilateral upper extremity supported;Feet supported Sitting  balance-Leahy Scale: Good     Standing balance support: Bilateral upper extremity supported;During functional activity Standing balance-Leahy Scale: Poor Standing balance comment: Pt must have outside support to stand.                           ADL either performed or assessed with clinical judgement   ADL Overall ADL's : Needs assistance/impaired Eating/Feeding: Set up;Sitting   Grooming: Sitting;Minimal assistance Grooming Details (indicate cue type and reason): R shouldler limits mobility with grooming Upper Body Bathing: Minimal assistance;Sitting   Lower Body Bathing: Maximal assistance;Sit to/from stand;Cueing for compensatory techniques Lower Body Bathing Details (indicate cue type and reason): limited due to pain and unable to reach much below her knees in sitting due to pain. Upper Body Dressing : Minimal assistance;Sitting   Lower Body Dressing: Maximal assistance;Sit to/from stand;Cueing for compensatory techniques Lower Body Dressing Details (indicate cue type and reason): caregiver assists with dressing daily; esp LE Toilet Transfer: Moderate assistance;RW;BSC;Stand-pivot   Toileting- Clothing Manipulation and Hygiene: Moderate assistance;Sit to/from stand;Cueing for compensatory techniques       Functional mobility during ADLs: Moderate assistance;Rolling walker General ADL Comments: Pt most limited by pain in L leg and R shoulder. Pt has had limitations with adls for some time now. Has a caregiver 6 days a week to assist with these tasks.  Some confusion as to how much pt is alone at home.  Pt states she "never walks without assist" but is alone  4 hours a day.  Pt gets up to use BSC on own but unsure how much pt ambulates alone.     Vision Ability to See in Adequate Light: 0 Adequate Patient Visual Report: No change from baseline Vision Assessment?: No apparent visual deficits     Perception Perception Perception Tested?: No   Praxis Praxis Praxis  tested?: Within functional limits    Pertinent Vitals/Pain Pain Assessment: 0-10 Pain Score: 8  Pain Location: LLE and R shoulder. Pain Descriptors / Indicators: Aching;Discomfort;Grimacing Pain Intervention(s): Limited activity within patient's tolerance;Monitored during session;Repositioned;Patient requesting pain meds-RN notified     Hand Dominance Right   Extremity/Trunk Assessment Upper Extremity Assessment Upper Extremity Assessment: RUE deficits/detail RUE Deficits / Details: AROM: severely limited in shoulder. Otherwise WFL. RUE: Unable to fully assess due to pain RUE Sensation: WNL RUE Coordination: WNL   Lower Extremity Assessment Lower Extremity Assessment: Defer to PT evaluation   Cervical / Trunk Assessment Cervical / Trunk Assessment: Kyphotic   Communication Communication Communication: No difficulties   Cognition Arousal/Alertness: Awake/alert Behavior During Therapy: WFL for tasks assessed/performed Overall Cognitive Status: Difficult to assess                                 General Comments: Pt slow to respond to questions and had some difficulty with memory. Could be related to meds. Will continue to monitor.   General Comments  Pt most limited by pain.  Question how much assist she has at home. Pt states she is alone 3-4 hours during the daytime.  At this point, pt needs 24 hour S.    Exercises     Shoulder Instructions      Home Living Family/patient expects to be discharged to:: Private residence Living Arrangements: Other relatives;Other (Comment) Available Help at Discharge: Family;Personal care attendant Type of Home: Apartment Home Access: Stairs to enter Entrance Stairs-Number of Steps: 3 Entrance Stairs-Rails: Right (not sturdy) Home Layout: One level     Bathroom Shower/Tub: Tub/shower unit;Curtain   Biochemist, clinical: Standard     Home Equipment: Environmental consultant - 2 wheels;Bedside commode;Grab bars - tub/shower;Shower  seat;Electric scooter;Hand held shower head   Additional Comments: Aid available 2 hours a day 6 days a week.      Prior Functioning/Environment Level of Independence: Needs assistance  Gait / Transfers Assistance Needed: patient states uses electric scooter for indoor/outdoor mobility, uses walker some indoors per pt as she reports having increased trouble ambulating ADL's / Homemaking Assistance Needed: patient reports aide assists with dressing, bathing, household IADLs   Comments: [place] in July.  Has had caregiver and issues with independence since that fall.        OT Problem List: Decreased strength;Decreased range of motion;Decreased activity tolerance;Impaired balance (sitting and/or standing);Decreased knowledge of use of DME or AE;Pain;Impaired UE functional use;Increased edema      OT Treatment/Interventions: Self-care/ADL training;Patient/family education;DME and/or AE instruction;Therapeutic activities    OT Goals(Current goals can be found in the care plan section) Acute Rehab OT Goals Patient Stated Goal: to do what is needed to go home OT Goal Formulation: With patient Time For Goal Achievement: 07/17/21 Potential to Achieve Goals: Good ADL Goals Pt Will Perform Grooming: with supervision;standing Pt Will Perform Upper Body Bathing: with supervision;sitting Pt Will Transfer to Toilet: with supervision;ambulating;bedside commode;regular height toilet Pt Will Perform Toileting - Clothing Manipulation and hygiene: with supervision;sit to/from stand Pt Will Perform Tub/Shower Transfer: Tub transfer;shower seat;rolling  walker;ambulating  OT Frequency: Min 2X/week   Barriers to D/C: Decreased caregiver support  Pt has caregiver but is alone 3-4 hours a day which is not ideal.       Co-evaluation              AM-PAC OT "6 Clicks" Daily Activity     Outcome Measure Help from another person eating meals?: A Little Help from another person taking care of personal  grooming?: A Little Help from another person toileting, which includes using toliet, bedpan, or urinal?: A Lot Help from another person bathing (including washing, rinsing, drying)?: A Lot Help from another person to put on and taking off regular upper body clothing?: A Little Help from another person to put on and taking off regular lower body clothing?: A Lot 6 Click Score: 15   End of Session Equipment Utilized During Treatment: Rolling walker Nurse Communication: Mobility status;Patient requests pain meds  Activity Tolerance: Patient limited by pain Patient left: in chair;with call bell/phone within reach;with chair alarm set  OT Visit Diagnosis: Unsteadiness on feet (R26.81)                Time: 3151-7616 OT Time Calculation (min): 32 min Charges:  OT General Charges $OT Visit: 1 Visit OT Evaluation $OT Eval Moderate Complexity: 1 Mod OT Treatments $Self Care/Home Management : 8-22 mins  Glenford Peers 07/03/2021, 11:18 AM

## 2021-07-03 NOTE — Progress Notes (Signed)
Physical Therapy Evaluation Patient Details Name: [name] [name] MRN: [id number] DOB: December 07, 1950 Today's Date: 07/03/2021  History of Present Illness  Pt is a 70 yo female admitted for cellulitis of the LLE.  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  Clinical Impression  Pt admitted with above diagnosis. Pt needing mod assist for mobility at times for transitions. Pt does not have 24 hour care at home therefore recommend that she go to Short term SNF for rehab prior to d/c home. Pt agrees.  Pt currently with functional limitations due to the deficits listed below (see PT Problem List). Pt will benefit from skilled PT to increase their independence and safety with mobility to allow discharge to the venue listed below.          Recommendations for follow up therapy are one component of a multi-disciplinary discharge planning process, led by the attending physician.  Recommendations may be updated based on patient status, additional functional criteria and insurance authorization.  Follow Up Recommendations SNF    Equipment Recommendations  None recommended by PT    Recommendations for Other Services       Precautions / Restrictions Precautions Precautions: Fall Restrictions Weight Bearing Restrictions: No      Mobility  Bed Mobility Overal bed mobility: Needs Assistance Bed Mobility: Supine to Sit     Supine to sit: Min assist     General bed mobility comments: Needed assist at trunk and LEs    Transfers Overall transfer level: Needs assistance Equipment used: Rolling walker (2 wheeled) Transfers: Sit to/from [employer] Sit to Stand: Mod assist Stand pivot transfers: Min assist       General transfer comment: Pt required assist to power up and cues for hand placement. Took pivotal steps to recliner with assist and cues for sequencing steps and RW. Aslo cues to stand upright as she maintains trunk  flexsion.  Ambulation/Gait                Stairs            Wheelchair Mobility    Modified Rankin (Stroke Patients Only)       Balance Overall balance assessment: Needs assistance Sitting-balance support: Bilateral upper extremity supported;Feet supported Sitting balance-Leahy Scale: Good     Standing balance support: Bilateral upper extremity supported;During functional activity Standing balance-Leahy Scale: Poor Standing balance comment: Pt must have outside support to stand.                             Pertinent Vitals/Pain Pain Assessment: 0-10 Pain Score: 8  Pain Location: LLE and R shoulder. Pain Descriptors / Indicators: Aching;Discomfort;Grimacing Pain Intervention(s): Limited activity within patient's tolerance;Monitored during session;Repositioned;Patient requesting pain meds-RN notified    Home Living Family/patient expects to be discharged to:: Private residence Living Arrangements: Other relatives;Other (Comment) Available Help at Discharge: Family;Personal care attendant Type of Home: Apartment Home Access: Stairs to enter Entrance Stairs-Rails: Right (not sturdy) Entrance Stairs-Number of Steps: 3 Home Layout: One level Home Equipment: Walker - 2 wheels;Bedside commode;Grab bars - tub/shower;Shower seat;Electric scooter;Hand held shower head Additional Comments: Aid available 2 hours a day 6 days a week.    Prior Function Level of Independence: Needs assistance   Gait / Transfers Assistance Needed: patient states uses electric scooter for indoor/outdoor mobility, uses walker some indoors per pt as she reports having increased trouble ambulating  ADL's /  Homemaking Assistance Needed: patient reports aide assists with dressing, bathing, household IADLs  Comments: [place] in July.  Has had caregiver and issues with independence since that fall.     Hand Dominance   Dominant Hand: Right    Extremity/Trunk Assessment   Upper  Extremity Assessment Upper Extremity Assessment: Defer to OT evaluation    Lower Extremity Assessment Lower Extremity Assessment: Generalized weakness    Cervical / Trunk Assessment Cervical / Trunk Assessment: Kyphotic  Communication   Communication: No difficulties  Cognition Arousal/Alertness: Awake/alert Behavior During Therapy: WFL for tasks assessed/performed Overall Cognitive Status: Difficult to assess                                 General Comments: Pt slow to respond to questions and had some difficulty with memory. Could be related to meds. Will continue to monitor.      General Comments      Exercises General Exercises - Lower Extremity Ankle Circles/Pumps: AROM;Both;10 reps;Supine Long Arc Quad: AROM;Both;10 reps;Seated   Assessment/Plan    PT Assessment Patient needs continued PT services  PT Problem List Decreased activity tolerance;Decreased balance;Decreased mobility;Decreased knowledge of use of DME;Decreased safety awareness;Decreased knowledge of precautions;Pain       PT Treatment Interventions DME instruction;Gait training;Functional mobility training;Therapeutic activities;Therapeutic exercise;Balance training;Patient/family education    PT Goals (Current goals can be found in the Care Plan section)  Acute Rehab PT Goals Patient Stated Goal: to do what is needed to go home PT Goal Formulation: With patient Time For Goal Achievement: 07/17/21 Potential to Achieve Goals: Good    Frequency Min 2X/week   Barriers to discharge Decreased caregiver support      Co-evaluation               AM-PAC PT "6 Clicks" Mobility  Outcome Measure Help needed turning from your back to your side while in a flat bed without using bedrails?: A Little Help needed moving from lying on your back to sitting on the side of a flat bed without using bedrails?: A Little Help needed moving to and from a bed to a chair (including a wheelchair)?: A  Lot Help needed standing up from a chair using your arms (e.g., wheelchair or bedside chair)?: A Lot Help needed to walk in hospital room?: Total Help needed climbing 3-5 steps with a railing? : Total 6 Click Score: 12    End of Session Equipment Utilized During Treatment: Gait belt Activity Tolerance: Patient limited by fatigue Patient left: in chair;with call bell/phone within reach;with chair alarm set;with family/visitor present Nurse Communication: Mobility status PT Visit Diagnosis: Muscle weakness (generalized) (M62.81);Unsteadiness on feet (R26.81)    Time: 9163-8466 PT Time Calculation (min) (ACUTE ONLY): 27 min   Charges:   PT Evaluation $PT Eval Moderate Complexity: 1 Mod PT Treatments $Therapeutic Activity: 8-22 mins        Daylah Sayavong M,PT Acute Rehab Services [phone] (pager)   Alvira Philips 07/03/2021, 3:01 PM

## 2021-07-03 NOTE — Progress Notes (Signed)
[facility] PROGRESS NOTE   [name] [name] VEL:[id number] DOB: [birth year] DOA: 07/01/2021  PCP: [name], MD  Brief History/Interval Summary:  70 y.o. female with medical history significant of chronic diastolic CHF; chronic pain; COPD; DM; HTN; HLD; hypothyroidism; OSA not on CPAP; obesity; stage 4 CKD; and PVD presented with complains of pain in both her legs and feet.  She was also noted to have fever.  There was concern for cellulitis of her lower extremity.  She was hospitalized for further management.    Consultants: None  Procedures: None    Subjective/Interval History: Patient mentions that she is feeling slightly better today compared to yesterday.  Pain in the left leg is slightly improved.  Denies any new complaints.  Her niece is at the bedside.       Assessment/Plan:  Left lower extremity cellulitis with sepsis Patient had leukocytosis fever tachycardia and tachypnea.  She has evidence for cellulitis of the left lower extremity evidenced by erythema and warmth.  Lower extremity Doppler studies did not show any DVTs.   Patient placed on broad-spectrum antibiotic coverage including vancomycin and cefepime and metronidazole which will be continued for now.  Her WBC was elevated at 35.9 on admission.  WBC is gradually improving.   Procalcitonin elevated at 3.0.  Cultures have been negative so far.  Her left leg does appear to be slightly better today compared to yesterday.  Continue current antibiotic treatment for now.   Chronic kidney disease stage IV/hypokalemia Baseline creatinine seems to be between 2.5 and 3.  Renal function seems to be close to baseline.  Monitor urine output.  Noted to have significantly BUN which is also chronic. Potassium was repleted.  Anemia of chronic kidney disease No evidence of overt bleeding.  She does mention dark stool but has not seen any red blood in her stool.  Review of her previous hemoglobin values show that she has  had anemia which has been longstanding.  However it appears that for a brief while it had improved to 10-12.  But then it did go back down to 7.3 in August.   Drop in hemoglobin thought to be dilutional.  2 units of PRBC ordered with improvement.  Continue to monitor.  FOBT x1.  Chronic pain syndrome She appears to have chronic pain at baseline.  Continue home medications.  Chronic combined systolic and diastolic CHF Echocardiogram done in May showed EF to be 45 to 50% with grade 1 diastolic dysfunction. Seems to be reasonably well compensated at this time.  Metolazone and Demadex are currently on hold.  Patient is on carvedilol which is being continued. She was given furosemide with her blood transfusions.  History of peripheral vascular disease Continue Plavix  Essential hypertension Blood pressure is reasonably well controlled.  Diabetes mellitus type 2, controlled Monitor CBGs.  SSI.  Holding Trulicity. Patient was on 75/25 insulin prior to admission.  CBGs have been reasonably well controlled.  Patient does report poor appetite.  Will hold off on initiating long-acting insulin for now.  Hypothyroidism TSH was normal.  Continue levothyroxine.  Obstructive sleep apnea Not on CPAP  Wheezing She was wheezing yesterday.  Chest x-ray did not show any new findings.  She is a current smoker and possibly diagnosed with COPD.  She was given nebulizer treatment with improvement.    Class III obesity Estimated body mass index is 36.02 kg/m as calculated from the following:   Height as of this encounter: 5\' 7"  (1.702 m).  Weight as of this encounter: 104.3 kg.    DVT Prophylaxis: Subcutaneous heparin Code Status: Full code Family Communication: Discussed with patient Disposition Plan: Hopefully return home when improved.  OT recommends home health with supervision.  Status is: Inpatient  Remains inpatient appropriate because:IV treatments appropriate due to intensity of illness  or inability to take PO and Inpatient level of care appropriate due to severity of illness  Dispo: The patient is from: Home              Anticipated d/c is to: Home              Patient currently is not medically stable to d/c.   Difficult to place patient No      Medications: Scheduled:  allopurinol  100 mg Oral Daily   carvedilol  12.5 mg Oral BID   cinacalcet  30 mg Oral Daily   clopidogrel  75 mg Oral Daily   heparin  5,000 Units Subcutaneous Q12H   insulin aspart  0-9 Units Subcutaneous TID WC   levothyroxine  200 mcg Oral Q0600   metoCLOPramide  5 mg Oral TID   pantoprazole  40 mg Oral Daily   polyethylene glycol  17 g Oral Daily   sodium chloride flush  3 mL Intravenous Q12H   Continuous:  ceFEPime (MAXIPIME) IV 2 g (07/03/21 0317)   metronidazole 500 mg (07/03/21 0902)   vancomycin 1,000 mg (07/03/21 0552)   WYO:VZCHYIFOYDXAJ **OR** acetaminophen, calcium carbonate (dosed in mg elemental calcium), camphor-menthol **AND** hydrOXYzine, docusate sodium, feeding supplement (NEPRO CARB STEADY), guaiFENesin-dextromethorphan, ipratropium-albuterol, ondansetron **OR** ondansetron (ZOFRAN) IV, oxyCODONE, polyvinyl alcohol, sorbitol, zolpidem  Antibiotics: Anti-infectives (From admission, onward)    Start     Dose/Rate Route Frequency Ordered Stop   07/03/21 0500  vancomycin (VANCOCIN) IVPB 1000 mg/200 mL premix        1,000 mg 200 mL/hr over 60 Minutes Intravenous Every 48 hours 07/01/21 1042 07/09/21 0459   07/02/21 0400  ceFEPIme (MAXIPIME) 2 g in sodium chloride 0.9 % 100 mL IVPB        2 g 200 mL/hr over 30 Minutes Intravenous Every 24 hours 07/01/21 1042 07/08/21 0359   07/01/21 1015  metroNIDAZOLE (FLAGYL) IVPB 500 mg        500 mg 100 mL/hr over 60 Minutes Intravenous Every 12 hours 07/01/21 1014 07/08/21 0859   07/01/21 0415  vancomycin (VANCOREADY) IVPB 2000 mg/400 mL        2,000 mg 200 mL/hr over 120 Minutes Intravenous  Once 07/01/21 0400 07/01/21 0730    07/01/21 0415  ceFEPIme (MAXIPIME) 2 g in sodium chloride 0.9 % 100 mL IVPB        2 g 200 mL/hr over 30 Minutes Intravenous  Once 07/01/21 0400 07/01/21 0515       Objective:  Vital Signs  Vitals:   07/02/21 1622 07/02/21 1835 07/02/21 2000 07/03/21 0725  BP: 132/73 131/71 (!) 118/56 138/85  Pulse: 75 72 83 98  Resp: 16 18 20 18   Temp: 98.7 F (37.1 C) 98 F (36.7 C) 98.6 F (37 C) 98.1 F (36.7 C)  TempSrc:  Oral Oral Oral  SpO2:  99%  90%  Weight:      Height:        Intake/Output Summary (Last 24 hours) at 07/03/2021 1205 Last data filed at 07/03/2021 0900 Gross per 24 hour  Intake 1409 ml  Output 2545 ml  Net -1136 ml    Filed Weights   07/01/21  0340  Weight: 104.3 kg    General appearance: Awake alert.  In no distress Resp: Clear to auscultation bilaterally.  Normal effort Cardio: S1-S2 is normal regular.  No S3-S4.  No rubs murmurs or bruit GI: Abdomen is soft.  Nontender nondistended.  Bowel sounds are present normal.  No masses organomegaly Extremities: Left lower extremity erythema seems to be slightly better.  Still remains tender and warm to touch.  Bruising noted in the anterior aspect of the left lower leg stable.   Neurologic:  No focal neurological deficits.     Lab Results:  Data Reviewed: I have personally reviewed following labs and imaging studies  CBC: Recent Labs  Lab 07/01/21 0650 07/02/21 0423 07/02/21 2019 07/03/21 0246  WBC 35.9* 19.9*  --  14.8*  NEUTROABS 33.5*  --   --   --   HGB 7.0* 6.0* 8.5* 8.1*  HCT 24.0* 20.0* 27.0* 25.9*  MCV 79.2* 76.9*  --  79.4*  PLT 321 290  --  258     Basic Metabolic Panel: Recent Labs  Lab 07/01/21 0401 07/02/21 0622 07/03/21 0246  NA 135 136 135  K 3.5 2.7* 3.7  CL 92* 95* 96*  CO2 29 29 29   GLUCOSE 145* 191* 158*  BUN 160* 149* 141*  CREATININE 2.90* 2.63* 2.58*  CALCIUM 9.2 8.9 8.5*     GFR: Estimated Creatinine Clearance: 25.2 mL/min (A) (by C-G formula based on SCr of  2.58 mg/dL (H)).  Liver Function Tests: Recent Labs  Lab 07/01/21 0401  AST 22  ALT 14  ALKPHOS 70  BILITOT 0.6  PROT 6.6  ALBUMIN 3.0*      Coagulation Profile: Recent Labs  Lab 07/01/21 0401  INR 1.0     Cardiac Enzymes: Recent Labs  Lab 07/01/21 1829  CKTOTAL 70      CBG: Recent Labs  Lab 07/02/21 1307 07/02/21 1730 07/02/21 2120 07/03/21 0618 07/03/21 1117  GLUCAP 161* 148* 199* 197* 182*       Thyroid Function Tests: Recent Labs    07/01/21 1829  TSH 2.690     Recent Results (from the past 240 hour(s))  Urine Culture     Status: None   Collection Time: 07/01/21  3:21 AM   Specimen: In/Out Cath Urine  Result Value Ref Range Status   Specimen Description IN/OUT CATH URINE  Final   Special Requests NONE  Final   Culture   Final    NO GROWTH Performed at [facility], [place] [address], [place], [place] [address]    Report Status 07/03/2021 FINAL  Final  Blood culture (routine single)     Status: None (Preliminary result)   Collection Time: 07/01/21  4:01 AM   Specimen: BLOOD  Result Value Ref Range Status   Specimen Description BLOOD RIGHT ANTECUBITAL  Final   Special Requests   Final    BOTTLES DRAWN AEROBIC AND ANAEROBIC Blood Culture adequate volume   Culture   Final    NO GROWTH 1 DAY Performed at [facility], [place] [address] [place], [place] [address]    Report Status PENDING  Incomplete  SARS CORONAVIRUS 2 (TAT 6-24 HRS) Nasopharyngeal Nasopharyngeal Swab     Status: None   Collection Time: 07/01/21 10:26 AM   Specimen: Nasopharyngeal Swab  Result Value Ref Range Status   SARS Coronavirus 2 NEGATIVE NEGATIVE Final    Comment: (NOTE) SARS-CoV-2 target nucleic acids are NOT DETECTED.  The SARS-CoV-2 RNA is generally  detectable in upper and lower respiratory specimens during the acute phase of infection. Negative results do not preclude SARS-CoV-2 infection, do not rule out co-infections with other pathogens, and  should not be used as the sole basis for treatment or other patient management decisions. Negative results must be combined with clinical observations, patient history, and epidemiological information. The expected result is Negative.  Fact Sheet for Patients: SugarRoll.be  Fact Sheet for Healthcare Providers: https://www.woods-mathews.com/  This test is not yet approved or cleared by the [place] FDA and  has been authorized for detection and/or diagnosis of SARS-CoV-2 by FDA under an Emergency Use Authorization (EUA). This EUA will remain  in effect (meaning this test can be used) for the duration of the COVID-19 declaration under Se ction 564(b)(1) of the Act, 21 U.S.C. section 360bbb-3(b)(1), unless the authorization is terminated or revoked sooner.  Performed at [facility], [place] [address] [place], [place] [address]        Radiology Studies: DG CHEST PORT 1 VIEW  Result Date: 07/02/2021 CLINICAL DATA:  70 year old female with wheezing. EXAM: PORTABLE CHEST 1 VIEW COMPARISON:  Portable chest 07/01/2021 and earlier. FINDINGS: Portable AP semi upright view at 0913 hours. Stable cardiomegaly and mediastinal contours. Larger lung volumes. Visualized tracheal air column is within normal limits. No pneumothorax or pleural effusion. Pulmonary vascularity appears stable without acute edema. Hazy opacity from chronic right anterior upper lung increased pleural fat again noted. No acute pulmonary opacity. Stable visualized osseous structures. IMPRESSION: Stable cardiomegaly. No acute cardiopulmonary abnormality. Electronically Signed   By: Genevie Ann M.D.   On: 07/02/2021 09:23       LOS: 1 day   [facility] Pager on [email or web address]  07/03/2021, 12:05 PM

## 2021-07-03 NOTE — Progress Notes (Signed)
Pt has increased wheezing, MD made aware, no new orders, gave PRN neb treatment

## 2021-07-03 NOTE — Progress Notes (Signed)
Initial Nutrition Assessment  DOCUMENTATION CODES:   Obesity unspecified  INTERVENTION:   -Glucerna Shake po BID, each supplement provides 220 kcal and 10 grams of protein  -Ensure Max po daily, each supplement provides 150 kcal and 30 grams of protein -MVI with minerals daily  NUTRITION DIAGNOSIS:   Increased nutrient needs related to acute illness as evidenced by estimated needs.  GOAL:   Patient will meet greater than or equal to 90% of their needs  MONITOR:   PO intake, Supplement acceptance, Diet advancement, Labs, Weight trends, Skin, I & O's  REASON FOR ASSESSMENT:   Malnutrition Screening Tool    ASSESSMENT:   [name] [name] is a 70 y.o. female with medical history significant of chronic diastolic CHF; chronic pain; COPD; DM; HTN; HLD; hypothyroidism; OSA not on CPAP; obesity; stage 4 CKD; and PVD presenting with RLE swelling.  She was reports that she was "hurting real bad".  She reported BLE pain, neither worse than the other.  She was too somnolent to answer additional questions.  Per EDP note, she was very weak at [facility] and unable to ambulate without assistance.  I was unable to reach her son by telephone at the time of admission.  Pt admitted with sepsis secondary to LLE cellulitis.   Reviewed I/O's: -2.1 L x 24 hours and +1.9 L since admission  UOP: 3.3 L x 24 hours  Pt sleepy at time of visit. She did not open her eyes during visit and was often delayed in answering questions.   Pt endorses poor appetite over the past several weeks, however, did not provide diet recall. Pt reports she has not eaten today. RD assisted pt with ordering lunch. Noted meal completions 100%.   Pt endorses wt loss, but unsure of UBW or how much weight she has lost. Noted wt has been stable, however, edema may be masking true weight loss.   Discussed importance of good meal and supplement intake to promote healing.  Medications reviewed and include reglan and miralax.   Lab  Results  Component Value Date   HGBA1C 6.8 (H) 02/15/2021   PTA DM medications are 40 units insulin lispro-protamine lispro in the morning, 30 units insulin lispro-protamine lispro in the evening, and 1.5 mg trulicity weekly .   Labs reviewed: CBGS: 197 (inpatient orders for glycemic control are 0-9 units insulin aspart TID with meals).    NUTRITION - FOCUSED PHYSICAL EXAM:  Flowsheet Row Most Recent Value  Orbital Region No depletion  Upper Arm Region No depletion  Thoracic and Lumbar Region No depletion  Buccal Region No depletion  Temple Region No depletion  Clavicle Bone Region No depletion  Clavicle and Acromion Bone Region No depletion  Scapular Bone Region No depletion  Dorsal Hand No depletion  Patellar Region Mild depletion  Anterior Thigh Region Mild depletion  Posterior Calf Region Mild depletion  Edema (RD Assessment) Moderate  Hair Reviewed  Eyes Reviewed  Mouth Reviewed  Skin Reviewed  Nails Reviewed       Diet Order:   Diet Order             Diet Carb Modified Fluid consistency: Thin; Room service appropriate? Yes  Diet effective ____                   EDUCATION NEEDS:   Education needs have been addressed  Skin:  Skin Assessment: Reviewed RN Assessment  Last BM:  07/02/21  Height:   Ht Readings from Last 1 Encounters:  07/01/21 5\' 7"  (1.702 m)    Weight:   Wt Readings from Last 1 Encounters:  07/01/21 104.3 kg    Ideal Body Weight:  61.4 kg  BMI:  Body mass index is 36.02 kg/m.  Estimated Nutritional Needs:   Kcal:  1850-2050  Protein:  90-105 grams  Fluid:  > 1.8 L    Loistine Chance, RD, LDN, [place] Registered Dietitian II Certified Diabetes Care and Education Specialist Please refer to [facility] for RD and/or RD on-call/weekend/after hours pager

## 2021-07-04 ENCOUNTER — Inpatient Hospital Stay (HOSPITAL_COMMUNITY): Payer: 59

## 2021-07-04 DIAGNOSIS — A419 Sepsis, unspecified organism: Secondary | ICD-10-CM | POA: Diagnosis not present

## 2021-07-04 LAB — BASIC METABOLIC PANEL
Anion gap: 9 (ref 5–15)
BUN: 136 mg/dL — ABNORMAL HIGH (ref 8–23)
CO2: 27 mmol/L (ref 22–32)
Calcium: 8.6 mg/dL — ABNORMAL LOW (ref 8.9–10.3)
Chloride: 98 mmol/L (ref 98–111)
Creatinine, Ser: 2.37 mg/dL — ABNORMAL HIGH (ref 0.44–1.00)
GFR, Estimated: 22 mL/min — ABNORMAL LOW (ref >60)
Glucose, Bld: 154 mg/dL — ABNORMAL HIGH (ref 70–99)
Potassium: 4.3 mmol/L (ref 3.5–5.1)
Sodium: 134 mmol/L — ABNORMAL LOW (ref 135–145)

## 2021-07-04 LAB — CBC
HCT: 27.6 % — ABNORMAL LOW (ref 36.0–46.0)
Hemoglobin: 8.6 g/dL — ABNORMAL LOW (ref 12.0–15.0)
MCH: 24.9 pg — ABNORMAL LOW (ref 26.0–34.0)
MCHC: 31.2 g/dL (ref 30.0–36.0)
MCV: 79.8 fL — ABNORMAL LOW (ref 80.0–100.0)
Platelets: 269 10*3/uL (ref 150–400)
RBC: 3.46 MIL/uL — ABNORMAL LOW (ref 3.87–5.11)
RDW: 20.8 % — ABNORMAL HIGH (ref 11.5–15.5)
WBC: 13.7 10*3/uL — ABNORMAL HIGH (ref 4.0–10.5)
nRBC: 0 % (ref 0.0–0.2)

## 2021-07-04 LAB — GLUCOSE, CAPILLARY
Glucose-Capillary: 130 mg/dL — ABNORMAL HIGH (ref 70–99)
Glucose-Capillary: 210 mg/dL — ABNORMAL HIGH (ref 70–99)
Glucose-Capillary: 140 mg/dL — ABNORMAL HIGH (ref 70–99)
Glucose-Capillary: 183 mg/dL — ABNORMAL HIGH (ref 70–99)

## 2021-07-04 MED ORDER — GUAIFENESIN ER 600 MG PO TB12
600.0000 mg | ORAL_TABLET | Freq: Two times a day (BID) | ORAL | Status: DC
  Administered 2021-07-04 – 2021-07-10 (×14): 600 mg via ORAL
  Filled 2021-07-04 (×14): qty 1

## 2021-07-04 MED ORDER — IPRATROPIUM-ALBUTEROL 0.5-2.5 (3) MG/3ML IN SOLN
3.0000 mL | Freq: Four times a day (QID) | RESPIRATORY_TRACT | Status: DC
  Administered 2021-07-04 – 2021-07-08 (×17): 3 mL via RESPIRATORY_TRACT
  Filled 2021-07-04 (×18): qty 3

## 2021-07-04 MED ORDER — FUROSEMIDE 10 MG/ML IJ SOLN
80.0000 mg | Freq: Two times a day (BID) | INTRAMUSCULAR | Status: DC
  Administered 2021-07-04 – 2021-07-07 (×7): 80 mg via INTRAVENOUS
  Filled 2021-07-04 (×7): qty 8

## 2021-07-04 MED ORDER — FUROSEMIDE 10 MG/ML IJ SOLN
20.0000 mg | Freq: Once | INTRAMUSCULAR | Status: AC
  Administered 2021-07-04: 20 mg via INTRAVENOUS
  Filled 2021-07-04: qty 2

## 2021-07-04 MED ORDER — CEFAZOLIN SODIUM-DEXTROSE 2-4 GM/100ML-% IV SOLN
2.0000 g | Freq: Two times a day (BID) | INTRAVENOUS | Status: DC
  Administered 2021-07-04 – 2021-07-08 (×9): 2 g via INTRAVENOUS
  Filled 2021-07-04 (×11): qty 100

## 2021-07-04 MED ORDER — POTASSIUM CHLORIDE CRYS ER 20 MEQ PO TBCR
20.0000 meq | EXTENDED_RELEASE_TABLET | Freq: Once | ORAL | Status: AC
  Administered 2021-07-04: 20 meq via ORAL
  Filled 2021-07-04: qty 1

## 2021-07-04 MED ORDER — FUROSEMIDE 10 MG/ML IJ SOLN
40.0000 mg | Freq: Once | INTRAMUSCULAR | Status: AC
  Administered 2021-07-04: 40 mg via INTRAVENOUS
  Filled 2021-07-04: qty 4

## 2021-07-04 NOTE — Progress Notes (Signed)
Pharmacy Antibiotic Note  [name] [name] is a 71 y.o. female admitted on 07/01/2021 with sepsis on cefepime, vancomycin and metronidazole secondary to cellulitis.  Pharmacy has been consulted to narrow antibiotics to cefazolin dosing.  WBC elevated, AF, SCr elevated but at baseline   Plan: -Vancomycin and cefepime d/c'ed -Start cefazolin 2 gm IV Q 12 hours -F/u metronidazole LOT  -Monitor CBC, renal fx and clinical progress  Height: 5\' 7"  (170.2 cm) Weight: 104.3 kg (230 lb) IBW/kg (Calculated) : 61.6  Temp (24hrs), Avg:98.5 F (36.9 C), Min:98.3 F (36.8 C), Max:98.6 F (37 C)  Recent Labs  Lab 07/01/21 0401 07/01/21 0650 07/02/21 0423 07/02/21 0622 07/03/21 0246 07/04/21 0137  WBC  --  35.9* 19.9*  --  14.8* 13.7*  CREATININE 2.90*  --   --  2.63* 2.58* 2.37*  LATICACIDVEN 1.5  --   --   --   --   --     Estimated Creatinine Clearance: 27.4 mL/min (A) (by C-G formula based on SCr of 2.37 mg/dL (H)).    Allergies  Allergen Reactions   Nebivolol Swelling    Chest pain (reaction to Bystolic)   Ace Inhibitors Swelling and Other (See Comments)    Tongue swell   Morphine And Related Itching    Antimicrobials this admission: Metronidazole 9/24 >>  Cefepime 9/24 >> 9/27 vancomycin 9/24 >> 9/27  Cefazolin 9/27 >>   Dose adjustments this admission:   Microbiology results: 9/24 BCx: ngtd 9/24 UCx:  negF   Thank you for allowing pharmacy to be a part of this patient's care.  Albertina Parr, PharmD., BCPS, BCCCP Clinical Pharmacist Please refer to [facility] for unit-specific pharmacist

## 2021-07-04 NOTE — NC FL2 (Signed)
[place] LEVEL OF CARE SCREENING TOOL     IDENTIFICATION  Patient Name: VENEDA KIRKSEY Birthdate: [birth year] Sex: female Admission Date (Current Location): 07/01/2021  [facility] and [place] Number:  Herbalist and Address:  [facility] [place], [place] [address], [place], [place] [address]      Provider Number: [id number]  Attending Physician Name and Address:  Elmarie Shiley, MD  Relative Name and Phone Number:  Phifer,Ray Pandora Leiter)   [phone]    Current Level of Care: Hospital Recommended Level of Care: [place] Prior Approval Number:    Date Approved/Denied:   PASRR Number: [id number]  Discharge Plan: SNF    Current Diagnoses: Patient Active Problem List   Diagnosis Date Noted   Chronic diastolic (congestive) heart failure ([place]) 07/01/2021   Chronic pain syndrome 07/01/2021   COPD (chronic obstructive pulmonary disease) ([place]) 07/01/2021   Cellulitis of leg, right 07/01/2021   SARS-CoV-2 positive 02/21/2021   Increased weakness when ambulating 02/17/2021   Intractable nausea and vomiting 02/15/2021   Symptomatic anemia 01/24/2021   Anemia, unspecified 01/08/2020   Bacteremia 01/07/2020   UTI (urinary tract infection) 01/06/2020   Anemia 10/26/2019   S/P lumbar fusion 09/18/2019   Chronic CHF (congestive heart failure) ([place]) 03/16/2019   Leukocytosis 03/16/2019   Chest pain 11/27/2018   Pre-syncope 11/27/2018   Epigastric abdominal pain 11/27/2018   Solitary pulmonary nodule 11/06/2018   Hypertensive heart and renal disease, stage 1-4 or unspecified chronic kidney disease, with heart failure ([place]) 10/20/2018   CHF (congestive heart failure), NYHA class III, acute on chronic, systolic ([place]) 47/65/4650   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, unspecified extremity ([place]) 04/19/2016   Cellulitis of abdominal wall 03/21/2015   Acute on chronic renal failure ([place]) 03/21/2015   DM  (diabetes mellitus), type 2 with renal complications ([place]) 35/46/5681   Abdominal wall abscess 03/21/2015   Failed total hip arthroplasty ([place]) 08/11/2014   Low urine output 08/11/2014   Acute on chronic systolic CHF (congestive heart failure), NYHA class 3 ([place]) 08/11/2014   Asthma, chronic 08/11/2014   Hypothyroidism 08/11/2014   OSA (obstructive sleep apnea) 08/11/2014   Positive cardiac stress test 06/29/2014   Hyperlipidemia 02/12/2014   Left arm weakness 12/23/2013   TIA (transient ischemic attack) 12/23/2013   Left buttock abscess 10/12/2013   Cellulitis and abscess of leg, right 04/16/2013   Acute blood loss anemia 02/14/2013   HTN (hypertension) 02/14/2013   Cellulitis and abscess of buttock 02/09/2013   Class 2 obesity due to excess calories with body mass index (BMI) of 36.0 to 36.9 in adult 02/03/2013   Diarrhea 02/03/2013   Hyponatremia 02/03/2013   Sepsis due to undetermined organism ([place]) 02/02/2013   Diabetes mellitus without complication ([place]) 27/51/7001   CKD (chronic kidney disease), stage IV ([place]) 02/02/2013   Cellulitis 02/02/2013   PAD (peripheral artery disease) ([place]) 04/26/2011   Tobacco abuse 04/26/2011    Orientation RESPIRATION BLADDER Height & Weight     Self, Time, Situation, Place  Normal Incontinent, External catheter Weight: 230 lb (104.3 kg) Height:  5\' 7"  (170.2 cm)  BEHAVIORAL SYMPTOMS/MOOD NEUROLOGICAL BOWEL NUTRITION STATUS      Incontinent Diet (See d/c summary)  AMBULATORY STATUS COMMUNICATION OF NEEDS Skin   Limited Assist Verbally Normal                       Personal Care Assistance Level of Assistance  Bathing, Feeding, Dressing Bathing Assistance: Limited assistance Feeding assistance: Independent Dressing Assistance: Limited assistance     Functional Limitations Info  Sight, Hearing, Speech Sight Info: Adequate Hearing Info: Adequate Speech Info: Adequate    SPECIAL CARE FACTORS FREQUENCY  PT (By licensed PT), OT  (By licensed OT)     PT Frequency: 5x/ week OT Frequency: 5x/ week            Contractures Contractures Info: Not present    Additional Factors Info  Code Status, Allergies, Insulin Sliding Scale Code Status Info: Full Allergies Info: Nebivolol   Ace Inhibitors   Morphine And Related   Insulin Sliding Scale Info: see d/c summary       Current Medications (07/04/2021):  This is the current hospital active medication list Current Facility-Administered Medications  Medication Dose Route Frequency Provider Last Rate Last Admin   acetaminophen (TYLENOL) tablet 650 mg  650 mg Oral Q6H PRN Karmen Bongo, MD   650 mg at 07/02/21 6578   Or   acetaminophen (TYLENOL) suppository 650 mg  650 mg Rectal Q6H PRN Karmen Bongo, MD       allopurinol (ZYLOPRIM) tablet 100 mg  100 mg Oral Daily Karmen Bongo, MD   100 mg at 07/04/21 4696   calcium carbonate (dosed in mg elemental calcium) suspension 500 mg of elemental calcium  500 mg of elemental calcium Oral Q6H PRN Karmen Bongo, MD       camphor-menthol [facility]) lotion 1 application  1 application Topical E9B PRN Karmen Bongo, MD   1 application at 28/41/32 0555   And   hydrOXYzine (ATARAX/VISTARIL) tablet 25 mg  25 mg Oral Q8H PRN Karmen Bongo, MD   25 mg at 07/03/21 0210   carvedilol (COREG) tablet 12.5 mg  12.5 mg Oral BID Karmen Bongo, MD   12.5 mg at 07/04/21 0903   ceFAZolin (ANCEF) IVPB 2g/100 mL premix  2 g Intravenous Q12H Lavenia Atlas, RPH 200 mL/hr at 07/04/21 1142 2 g at 07/04/21 1142   cinacalcet (SENSIPAR) tablet 30 mg  30 mg Oral Daily Karmen Bongo, MD   30 mg at 07/04/21 4401   clopidogrel (PLAVIX) tablet 75 mg  75 mg Oral Daily Karmen Bongo, MD   75 mg at 07/04/21 0272   docusate sodium (ENEMEEZ) enema 283 mg  1 enema Rectal PRN Karmen Bongo, MD       feeding supplement (GLUCERNA SHAKE) (GLUCERNA SHAKE) liquid 237 mL  237 mL Oral BID BM Bonnielee Haff, MD   237 mL at 07/04/21 0914   feeding  supplement (NEPRO CARB STEADY) liquid 237 mL  237 mL Oral TID PRN Karmen Bongo, MD       furosemide (LASIX) injection 80 mg  80 mg Intravenous Q12H Regalado, Belkys A, MD   80 mg at 07/04/21 0904   guaiFENesin (MUCINEX) 12 hr tablet 600 mg  600 mg Oral BID Regalado, Belkys A, MD   600 mg at 07/04/21 0904   guaiFENesin-dextromethorphan (ROBITUSSIN DM) 100-10 MG/5ML syrup 10 mL  10 mL Oral Q6H PRN Athena Masse, MD   10 mL at 07/03/21 2335   heparin injection 5,000 Units  5,000 Units Subcutaneous Q12H Karmen Bongo, MD   5,000 Units at 07/04/21 0903   insulin aspart (novoLOG) injection 0-9 Units  0-9 Units Subcutaneous TID WC Karmen Bongo, MD   1 Units at 07/04/21 0917   ipratropium-albuterol (DUONEB) 0.5-2.5 (3) MG/3ML nebulizer solution 3 mL  3 mL Nebulization Q6H Regalado, Belkys A, MD  3 mL at 07/04/21 1038   levothyroxine (SYNTHROID) tablet 200 mcg  200 mcg Oral Q0600 Karmen Bongo, MD   200 mcg at 07/04/21 0521   metoCLOPramide (REGLAN) tablet 5 mg  5 mg Oral TID Karmen Bongo, MD   5 mg at 07/04/21 0904   metroNIDAZOLE (FLAGYL) IVPB 500 mg  500 mg Intravenous Lillia [place], MD 100 mL/hr at 07/04/21 0913 500 mg at 07/04/21 0913   multivitamin with minerals tablet 1 tablet  1 tablet Oral Daily Bonnielee Haff, MD   1 tablet at 07/04/21 0903   ondansetron (ZOFRAN) tablet 4 mg  4 mg Oral Q6H PRN Karmen Bongo, MD       Or   ondansetron [facility]) injection 4 mg  4 mg Intravenous Q6H PRN Karmen Bongo, MD       oxyCODONE (Oxy IR/ROXICODONE) immediate release tablet 5 mg  5 mg Oral Q6H PRN Bonnielee Haff, MD   5 mg at 07/03/21 2247   pantoprazole (PROTONIX) EC tablet 40 mg  40 mg Oral Daily Karmen Bongo, MD   40 mg at 07/04/21 0904   polyethylene glycol (MIRALAX / GLYCOLAX) packet 17 g  17 g Oral Daily Heloise Purpura, RPH   17 g at 07/03/21 7824   polyvinyl alcohol (LIQUIFILM TEARS) 1.4 % ophthalmic solution 2 drop  2 drop Both Eyes Daily PRN Karmen Bongo, MD        protein supplement (ENSURE MAX) liquid  11 oz Oral QHS Bonnielee Haff, MD   11 oz at 07/03/21 2247   sodium chloride flush (NS) 0.9 % injection 3 mL  3 mL Intravenous Lillia Mountain, MD   3 mL at 07/04/21 0907   sorbitol 70 % solution 30 mL  30 mL Oral PRN Karmen Bongo, MD       zolpidem Lorrin Mais) tablet 5 mg  5 mg Oral QHS PRN Karmen Bongo, MD         Discharge Medications: Please see discharge summary for a list of discharge medications.  Relevant Imaging Results:  Relevant Lab Results:   Additional Information SSN [id number],  Patient reports being COVID vaccinated x2,  5'7" 230lbs  Deyonna Fitzsimmons F Kamayah Pillay, LCSWA

## 2021-07-04 NOTE — Plan of Care (Signed)
  Problem: Clinical Measurements: Goal: Ability to maintain clinical measurements within normal limits will improve Outcome: Progressing   

## 2021-07-04 NOTE — Progress Notes (Addendum)
Messaged J. Olena Heckle, NP "Pt is SOB, O2 100% on room air. I put her on 2 L of O2. Sunday she received Lasix. Monday she did not. She said she takes it at home. She is very wheezy."  Pt was given a Neb treatment, but continues to have wheezing and coughing. She stated the Neb treatment helped.

## 2021-07-04 NOTE — TOC Initial Note (Signed)
Transition of Care [facility]) - Initial/Assessment Note    Patient Details  Name: [name] [name] MRN: [id number] Date of Birth: 1950-10-20  Transition of Care [facility]) CM/SW Contact:    Milinda Antis, [place] Phone Number: 07/04/2021, 11:38 AM  Clinical Narrative:                 CSW received consult for possible SNF placement at time of discharge. CSW spoke with patient. Patient expressed understanding of PT recommendation and is agreeable to SNF placement at time of discharge. Patient reports preference for a facility in [place]. CSW discussed insurance authorization process and will provide Medicare SNF ratings list. Patient has received 2 COVID vaccines. Patient expressed being hopeful for rehab and to feel better soon. No further questions reported at this time.   Skilled Nursing Rehab Facilities-   [email or web address] Ratings out of 5 possible    Name Address  Phone # [place] Inspection Overall  [facility] [address], [place] 5 1 4 4   [facility]  [address] [place], [place] [phone] 3 1 5 4   [facility] [place], [place] 3 1 1 1   [facility], [place] 2 2 4 4   [facility] [address], [place] 3 1 2 1   [place] [address], [place] 3 2 4 4   [facility] [address], [place] 5 1 2 2   [facility] [address], [place] 5 2 2 3   [place] [facility] [place], [address] [place] [phone] 5 1 2 2   [facility] [phone] [address], [phone] 5 1 2 2   [facility] [address], [facility] [phone] 5 1 2 2   [place] [address] [place] 3 1 1 1      Expected Discharge Plan: Skilled Nursing Facility Barriers to Discharge: Insurance  Authorization, SNF Pending bed offer   Patient Goals and CMS Choice Patient states their goals for this hospitalization and ongoing recovery are:: To return home CMS Medicare.gov Compare Post Acute Care list provided to:: Patient Choice offered to / list presented to : Patient  Expected Discharge Plan and Services Expected Discharge Plan: [place] arrangements for the past 2 months: Apartment                                      Prior Living Arrangements/Services Living arrangements for the past 2 months: Apartment Lives with:: Adult Children Patient language and need for interpreter reviewed:: Yes Do you feel safe going back to the place where you live?: Yes      Need for Family Participation in Patient Care: Yes (Comment) Care giver support system in place?: No (comment)   Criminal Activity/Legal Involvement Pertinent to Current Situation/Hospitalization: No - Comment as needed  Activities of Daily Living      Permission Sought/Granted   Permission granted to share information with : Yes, Verbal Permission Granted     Permission granted to share info w AGENCY: SNF        Emotional Assessment Appearance:: Appears stated age Attitude/Demeanor/Rapport: Engaged Affect (typically observed): Accepting, Pleasant Orientation: : Oriented to Self, Oriented to Place, Oriented to  Time, Oriented to Situation Alcohol / Substance Use: Not Applicable Psych Involvement: No (  comment)  Admission diagnosis:  Cellulitis of leg, right [L03.115] Cellulitis [L03.90] Patient Active Problem List   Diagnosis Date Noted   Chronic diastolic (congestive) heart failure ([place]) 07/01/2021   Chronic pain syndrome 07/01/2021   COPD (chronic obstructive pulmonary disease) ([place]) 07/01/2021   Cellulitis of leg, right 07/01/2021   SARS-CoV-2 positive 02/21/2021   Increased weakness when ambulating 02/17/2021   Intractable nausea and vomiting 02/15/2021    Symptomatic anemia 01/24/2021   Anemia, unspecified 01/08/2020   Bacteremia 01/07/2020   UTI (urinary tract infection) 01/06/2020   Anemia 10/26/2019   S/P lumbar fusion 09/18/2019   Chronic CHF (congestive heart failure) ([place]) 03/16/2019   Leukocytosis 03/16/2019   Chest pain 11/27/2018   Pre-syncope 11/27/2018   Epigastric abdominal pain 11/27/2018   Solitary pulmonary nodule 11/06/2018   Hypertensive heart and renal disease, stage 1-4 or unspecified chronic kidney disease, with heart failure ([place]) 10/20/2018   CHF (congestive heart failure), NYHA class III, acute on chronic, systolic ([place]) 75/64/3329   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, unspecified extremity ([place]) 04/19/2016   Cellulitis of abdominal wall 03/21/2015   Acute on chronic renal failure ([place]) 03/21/2015   DM (diabetes mellitus), type 2 with renal complications ([place]) 51/88/4166   Abdominal wall abscess 03/21/2015   Failed total hip arthroplasty ([place]) 08/11/2014   Low urine output 08/11/2014   Acute on chronic systolic CHF (congestive heart failure), NYHA class 3 ([place]) 08/11/2014   Asthma, chronic 08/11/2014   Hypothyroidism 08/11/2014   OSA (obstructive sleep apnea) 08/11/2014   Positive cardiac stress test 06/29/2014   Hyperlipidemia 02/12/2014   Left arm weakness 12/23/2013   TIA (transient ischemic attack) 12/23/2013   Left buttock abscess 10/12/2013   Cellulitis and abscess of leg, right 04/16/2013   Acute blood loss anemia 02/14/2013   HTN (hypertension) 02/14/2013   Cellulitis and abscess of buttock 02/09/2013   Class 2 obesity due to excess calories with body mass index (BMI) of 36.0 to 36.9 in adult 02/03/2013   Diarrhea 02/03/2013   Hyponatremia 02/03/2013   Sepsis due to undetermined organism ([place]) 02/02/2013   Diabetes mellitus without complication ([place]) 04/06/1600   CKD (chronic kidney disease), stage IV ([place]) 02/02/2013   Cellulitis 02/02/2013   PAD (peripheral artery  disease) ([place]) 04/26/2011   Tobacco abuse 04/26/2011   PCP:  [name], MD Pharmacy:   [facility] [place], [address] AT [place] [address] Phone: [phone] Fax: [phone]  [facility] - [place] - [address] [place] [address] Phone: [phone] Fax: [phone]     Social Determinants of Health (SDOH) Interventions    Readmission Risk Interventions Readmission Risk Prevention Plan 01/14/2020  Transportation Screening Complete  Medication Review (RN Care Manager) Complete  PCP or Specialist appointment within 3-5 days of discharge Complete  HRI or Home Care Consult Complete  SW Recovery Care/Counseling Consult Complete  Palliative Care Screening Not Applicable  Skilled Nursing Facility Complete  Some recent data might be hidden

## 2021-07-04 NOTE — Progress Notes (Addendum)
PROGRESS NOTE    [name] [name]  DGU:[id number] DOB: 1950-11-04 DOA: 07/01/2021 PCP: [name], MD   Brief Narrative: 70 year old with past medical history significant for chronic diastolic and systolic heart failure chronic pain, COPD, diabetes, hypertension, hyperlipidemia, hypothyroidism, OSA not on CPAP, obesity, stage IV CKD and PVD who presents complaining of pain in both her legs and feet.  She was also noted to have a fever.  She was admitted with left lower extremity cellulitis.  Hospital course complicated by worsening shortness of breath and acute on chronic heart failure exacerbation.   Assessment & Plan:   Principal Problem:   Sepsis due to undetermined organism [facility]) Active Problems:   PAD (peripheral artery disease) ([place])   Diabetes mellitus without complication (HCC)   Cellulitis   Class 2 obesity due to excess calories with body mass index (BMI) of 36.0 to 36.9 in adult   Hyperlipidemia   Hypothyroidism   Chronic diastolic (congestive) heart failure (HCC)   Chronic pain syndrome   Cellulitis of leg, left    1-Left LE cellulitis with sepsis;  Patient presented with leukocytosis, fever, tachycardia, tachypnea.  Evidence of lower extremity cellulitis. Sepsis POA.  Patient was initially treated with vancomycin and cefepime and metronidazole. Blood cell continue to decrease.  Procalcitonin level 3.0. Plan to change antibiotics to Ancef for nonpurulent cellulitis Redness improving.  2-Acute on chronic combine systolic and diastolic HF exacerbation.  Positive 1 L.  Weight: 104-- Right with cardiomegaly and early signs of edema Patient complaining of worsening shortness of breath.  Her diuretics has been on hold. Start IV Lasix 80 mg IV twice daily.  Strict I's and O's and daily weight.  3-COPD: Patient with worsening shortness of breath, bilateral wheezing, plan to schedule nebulizer.  If no improvement could consider IV steroids. Start  guaifenesin  4-CKD stage IV, hypokalemia: Creatinine baseline 2.5-3. Monitor on IV Lasix.  5-Anemia of chronic kidney disease: Patient reported dark stool.  But no red blood in the stool. FOBT Received 2 units of packed red blood cell during this admission. Hb  is stable.  6-Chronic pain syndrome, Continue  with home medication  Peripheral vascular disease: Continue with Plavix Diabetes type 2: Holding Trulicity. Continue with a sliding scale insulin. Oral intakes 75/25 home dose has been on hold  Hypothyroidism: Continue with levothyroxine OSA: Not on CPAP Class 3 obesity: Needs lifestyle changes   Nutrition Problem: Increased nutrient needs Etiology: acute illness    Signs/Symptoms: estimated needs    Interventions: MVI, Premier Protein, Glucerna shake  Estimated body mass index is 36.02 kg/m as calculated from the following:   Height as of this encounter: 5\' 7"  (1.702 m).   Weight as of this encounter: 104.3 kg.   DVT prophylaxis: Heparin Code Status: Full code Family Communication: Care discussed with patient Disposition Plan:  Status is: Inpatient  Remains inpatient appropriate because:IV treatments appropriate due to intensity of illness or inability to take PO  Dispo: The patient is from: Home              Anticipated d/c is to: SNF              Patient currently is not medically stable to d/c.   Difficult to place patient No        Consultants:  none  Procedures:  none  Antimicrobials:    Subjective: She report productive cough, shortness of breath over the last couple of days, getting worse.  She denies chest pain.  Left lower extremity redness has improved.   Objective: Vitals:   07/03/21 1531 07/03/21 2244 07/04/21 0145 07/04/21 0430  BP: (!) 161/84 (!) 145/67    Pulse: 80 87    Resp: 17 19    Temp: 98.3 F (36.8 C) 98.6 F (37 C)    TempSrc: Oral Oral    SpO2: 100% 97% 100% 98%  Weight:      Height:         Intake/Output Summary (Last 24 hours) at 07/04/2021 5852 Last data filed at 07/04/2021 0200 Gross per 24 hour  Intake 720 ml  Output 1600 ml  Net -880 ml   Filed Weights   07/01/21 0340  Weight: 104.3 kg    Examination:  General exam: Appears calm and comfortable  Respiratory system: Bilateral rhonchorous, wheezing and crackles Cardiovascular system: S1 & S2 heard, RRR.  Gastrointestinal system: Abdomen is nondistended, soft and nontender. No organomegaly or masses felt. Normal bowel sounds heard. Central nervous system: Alert and oriented. No focal neurological deficits. Extremities: Symmetric 5 x 5 power. Skin: No rashes, lesions or ulcers Psychiatry: Judgement and insight appear normal. Mood & affect appropriate.     Data Reviewed: I have personally reviewed following labs and imaging studies  CBC: Recent Labs  Lab 07/01/21 0650 07/02/21 0423 07/02/21 2019 07/03/21 0246 07/04/21 0137  WBC 35.9* 19.9*  --  14.8* 13.7*  NEUTROABS 33.5*  --   --   --   --   HGB 7.0* 6.0* 8.5* 8.1* 8.6*  HCT 24.0* 20.0* 27.0* 25.9* 27.6*  MCV 79.2* 76.9*  --  79.4* 79.8*  PLT 321 290  --  258 778   Basic Metabolic Panel: Recent Labs  Lab 07/01/21 0401 07/02/21 0622 07/03/21 0246 07/04/21 0137  NA 135 136 135 134*  K 3.5 2.7* 3.7 4.3  CL 92* 95* 96* 98  CO2 29 29 29 27   GLUCOSE 145* 191* 158* 154*  BUN 160* 149* 141* 136*  CREATININE 2.90* 2.63* 2.58* 2.37*  CALCIUM 9.2 8.9 8.5* 8.6*   GFR: Estimated Creatinine Clearance: 27.4 mL/min (A) (by C-G formula based on SCr of 2.37 mg/dL (H)). Liver Function Tests: Recent Labs  Lab 07/01/21 0401  AST 22  ALT 14  ALKPHOS 70  BILITOT 0.6  PROT 6.6  ALBUMIN 3.0*   No results for input(s): LIPASE, AMYLASE in the last 168 hours. No results for input(s): AMMONIA in the last 168 hours. Coagulation Profile: Recent Labs  Lab 07/01/21 0401  INR 1.0   Cardiac Enzymes: Recent Labs  Lab 07/01/21 1829  CKTOTAL 70   BNP  (last 3 results) No results for input(s): PROBNP in the last 8760 hours. HbA1C: No results for input(s): HGBA1C in the last 72 hours. CBG: Recent Labs  Lab 07/02/21 2120 07/03/21 0618 07/03/21 1117 07/03/21 1608 07/03/21 2102  GLUCAP 199* 197* 182* 153* 176*   Lipid Profile: No results for input(s): CHOL, HDL, LDLCALC, TRIG, CHOLHDL, LDLDIRECT in the last 72 hours. Thyroid Function Tests: Recent Labs    07/01/21 1829  TSH 2.690   Anemia Panel: No results for input(s): VITAMINB12, FOLATE, FERRITIN, TIBC, IRON, RETICCTPCT in the last 72 hours. Sepsis Labs: Recent Labs  Lab 07/01/21 0401 07/01/21 1829  PROCALCITON  --  3.00  LATICACIDVEN 1.5  --     Recent Results (from the past 240 hour(s))  Urine Culture     Status: None   Collection Time: 07/01/21  3:21 AM   Specimen: In/Out Cath Urine  Result Value Ref Range Status   Specimen Description IN/OUT CATH URINE  Final   Special Requests NONE  Final   Culture   Final    NO GROWTH Performed at [facility], [address] [place], [place] [address]    Report Status 07/03/2021 FINAL  Final  Blood culture (routine single)     Status: None (Preliminary result)   Collection Time: 07/01/21  4:01 AM   Specimen: BLOOD  Result Value Ref Range Status   Specimen Description BLOOD RIGHT ANTECUBITAL  Final   Special Requests   Final    BOTTLES DRAWN AEROBIC AND ANAEROBIC Blood Culture adequate volume   Culture   Final    NO GROWTH 2 DAYS Performed at [facility], [place] [address], [place], [place] [address]    Report Status PENDING  Incomplete  SARS CORONAVIRUS 2 (TAT 6-24 HRS) Nasopharyngeal Nasopharyngeal Swab     Status: None   Collection Time: 07/01/21 10:26 AM   Specimen: Nasopharyngeal Swab  Result Value Ref Range Status   SARS Coronavirus 2 NEGATIVE NEGATIVE Final    Comment: (NOTE) SARS-CoV-2 target nucleic acids are NOT DETECTED.  The SARS-CoV-2 RNA is generally detectable in upper and  lower respiratory specimens during the acute phase of infection. Negative results do not preclude SARS-CoV-2 infection, do not rule out co-infections with other pathogens, and should not be used as the sole basis for treatment or other patient management decisions. Negative results must be combined with clinical observations, patient history, and epidemiological information. The expected result is Negative.  Fact Sheet for Patients: SugarRoll.be  Fact Sheet for Healthcare Providers: https://www.woods-mathews.com/  This test is not yet approved or cleared by the [place] FDA and  has been authorized for detection and/or diagnosis of SARS-CoV-2 by FDA under an Emergency Use Authorization (EUA). This EUA will remain  in effect (meaning this test can be used) for the duration of the COVID-19 declaration under Se ction 564(b)(1) of the Act, 21 U.S.C. section 360bbb-3(b)(1), unless the authorization is terminated or revoked sooner.  Performed at [facility], [place] [address] [place], [place] [address]          Radiology Studies: DG CHEST PORT 1 VIEW  Result Date: 07/02/2021 CLINICAL DATA:  70 year old female with wheezing. EXAM: PORTABLE CHEST 1 VIEW COMPARISON:  Portable chest 07/01/2021 and earlier. FINDINGS: Portable AP semi upright view at 0913 hours. Stable cardiomegaly and mediastinal contours. Larger lung volumes. Visualized tracheal air column is within normal limits. No pneumothorax or pleural effusion. Pulmonary vascularity appears stable without acute edema. Hazy opacity from chronic right anterior upper lung increased pleural fat again noted. No acute pulmonary opacity. Stable visualized osseous structures. IMPRESSION: Stable cardiomegaly. No acute cardiopulmonary abnormality. Electronically Signed   By: Genevie Ann M.D.   On: 07/02/2021 09:23        Scheduled Meds:  allopurinol  100 mg Oral Daily   carvedilol  12.5 mg  Oral BID   cinacalcet  30 mg Oral Daily   clopidogrel  75 mg Oral Daily   feeding supplement (GLUCERNA SHAKE)  237 mL Oral BID BM   heparin  5,000 Units Subcutaneous Q12H   insulin aspart  0-9 Units Subcutaneous TID WC   levothyroxine  200 mcg Oral Q0600   metoCLOPramide  5 mg Oral TID   multivitamin with minerals  1 tablet Oral Daily   pantoprazole  40 mg Oral Daily   polyethylene glycol  17 g Oral  Daily   Ensure Max Protein  11 oz Oral QHS   sodium chloride flush  3 mL Intravenous Q12H   Continuous Infusions:  ceFEPime (MAXIPIME) IV 2 g (07/04/21 0414)   metronidazole 500 mg (07/03/21 2239)   vancomycin 1,000 mg (07/03/21 0552)     LOS: 2 days    Time spent: 35 minutes.     Elmarie Shiley, MD [facility]   If 7PM-7AM, please contact night-coverage [email or web address]  07/04/2021, 8:12 AM

## 2021-07-05 ENCOUNTER — Encounter (HOSPITAL_COMMUNITY): Payer: Self-pay | Admitting: Internal Medicine

## 2021-07-05 DIAGNOSIS — A419 Sepsis, unspecified organism: Secondary | ICD-10-CM | POA: Diagnosis not present

## 2021-07-05 LAB — BASIC METABOLIC PANEL
Anion gap: 9 (ref 5–15)
BUN: 126 mg/dL — ABNORMAL HIGH (ref 8–23)
CO2: 30 mmol/L (ref 22–32)
Calcium: 8.9 mg/dL (ref 8.9–10.3)
Chloride: 99 mmol/L (ref 98–111)
Creatinine, Ser: 2.2 mg/dL — ABNORMAL HIGH (ref 0.44–1.00)
GFR, Estimated: 24 mL/min — ABNORMAL LOW (ref >60)
Glucose, Bld: 146 mg/dL — ABNORMAL HIGH (ref 70–99)
Potassium: 4.1 mmol/L (ref 3.5–5.1)
Sodium: 138 mmol/L (ref 135–145)

## 2021-07-05 LAB — GLUCOSE, CAPILLARY
Glucose-Capillary: 166 mg/dL — ABNORMAL HIGH (ref 70–99)
Glucose-Capillary: 144 mg/dL — ABNORMAL HIGH (ref 70–99)
Glucose-Capillary: 326 mg/dL — ABNORMAL HIGH (ref 70–99)

## 2021-07-05 LAB — CBC
HCT: 27.1 % — ABNORMAL LOW (ref 36.0–46.0)
Hemoglobin: 8.1 g/dL — ABNORMAL LOW (ref 12.0–15.0)
MCH: 24.5 pg — ABNORMAL LOW (ref 26.0–34.0)
MCHC: 29.9 g/dL — ABNORMAL LOW (ref 30.0–36.0)
MCV: 81.9 fL (ref 80.0–100.0)
Platelets: 263 10*3/uL (ref 150–400)
RBC: 3.31 MIL/uL — ABNORMAL LOW (ref 3.87–5.11)
RDW: 21.3 % — ABNORMAL HIGH (ref 11.5–15.5)
WBC: 10.9 10*3/uL — ABNORMAL HIGH (ref 4.0–10.5)
nRBC: 0 % (ref 0.0–0.2)

## 2021-07-05 MED ORDER — TRAZODONE HCL 50 MG PO TABS
50.0000 mg | ORAL_TABLET | Freq: Every evening | ORAL | Status: DC | PRN
  Administered 2021-07-09 – 2021-07-10 (×2): 50 mg via ORAL
  Filled 2021-07-05 (×2): qty 1

## 2021-07-05 MED ORDER — SENNOSIDES-DOCUSATE SODIUM 8.6-50 MG PO TABS: 1.0000 | ORAL_TABLET | Freq: Every evening | ORAL | Status: DC | PRN

## 2021-07-05 MED ORDER — PREDNISONE 20 MG PO TABS
40.0000 mg | ORAL_TABLET | Freq: Every day | ORAL | Status: AC
  Administered 2021-07-05 – 2021-07-09 (×5): 40 mg via ORAL
  Filled 2021-07-05 (×6): qty 2

## 2021-07-05 MED ORDER — IPRATROPIUM-ALBUTEROL 0.5-2.5 (3) MG/3ML IN SOLN
3.0000 mL | RESPIRATORY_TRACT | Status: DC | PRN
  Administered 2021-07-07 – 2021-07-09 (×3): 3 mL via RESPIRATORY_TRACT
  Filled 2021-07-05 (×3): qty 3

## 2021-07-05 NOTE — Progress Notes (Signed)
PROGRESS NOTE    [name] [name]  YWV:[id number] DOB: [birth year] DOA: 07/01/2021 PCP: [name], MD   Brief Narrative:  70 year old with past medical history significant for chronic diastolic and systolic heart failure chronic pain, COPD, diabetes, hypertension, hyperlipidemia, hypothyroidism, OSA not on CPAP, obesity, stage IV CKD and PVD who presents complaining of pain in both her legs and feet.  She was also noted to have a fever.  She was admitted with left lower extremity cellulitis.  Hospital course complicated by worsening shortness of breath and acute on chronic heart failure exacerbation.   Assessment & Plan:   Principal Problem:   Sepsis due to undetermined organism [facility]) Active Problems:   PAD (peripheral artery disease) ([place])   Diabetes mellitus without complication (HCC)   Cellulitis   Class 2 obesity due to excess calories with body mass index (BMI) of 36.0 to 36.9 in adult   Hyperlipidemia   Hypothyroidism   Chronic diastolic (congestive) heart failure (HCC)   Chronic pain syndrome   Cellulitis of leg, right  Left LE cellulitis with sepsis;  -Sepsis physiology is improving - Empiric antibiotics-IV Ancef and Flagyl   Acute congestive heart failure with reduced ejection fraction, 45%, class III -Currently on Lasix 80 mg IV twice daily.  Monitor electrolytes and replete as needed - Coreg 12.5 mg twice daily   COPD; Mild exacerbation Patient with worsening shortness of breath, bilateral wheezing, plan to schedule nebulizer scheduled and prn. Will start Prednisone 40mg  po daily x 5 days.    CKD stage IV Creatinine baseline 2.5-3. Monitor on IV Lasix.   Anemia of chronic kidney disease: Hx of Erosive gastritis  -No obvious evidence of blood loss.  Status post 2 unit PRBC 9/25.  Hemoglobin now stable around 8.0   Chronic pain syndrome, Continue  with home medication   Peripheral vascular disease -Continue with Plavix  Diabetes type 2: -Trulicity on  hold.  Sliding scale and Accu-Cheks.   Hypothyroidism:  -Continue with levothyroxine  OSA: Not on CPAP  Class 3 obesity: Needs lifestyle changes     Superficial boil in rectal area. Superficial dressing is ok.     DVT prophylaxis: heparin injection 5,000 Units Start: 07/01/21 1400 Code Status: Full Family Communication:    Status is: Inpatient  Remains inpatient appropriate because:Inpatient level of care appropriate due to severity of illness  Dispo: The patient is from: Home              Anticipated d/c is to: Home              Patient currently is not medically stable to d/c.   Difficult to place patient No       Nutritional status  Nutrition Problem: Increased nutrient needs Etiology: acute illness  Signs/Symptoms: estimated needs  Interventions: MVI, Premier Protein, Glucerna shake  Body mass index is 33.84 kg/m.           Subjective: Feels ok, still has some orthopnea and exertional shortness of breath.  Had mild bleeding/Pus discharge from her boil in her groin/rectal area  Female RN students in the room with me during examination as a chaperone  Review of Systems Otherwise negative except as per HPI, including: General: Denies fever, chills, night sweats or unintended weight loss. Resp: Denies cough, wheezing, shortness of breath. Cardiac: Denies chest pain, palpitations, orthopnea, paroxysmal nocturnal dyspnea. GI: Denies abdominal pain, nausea, vomiting, diarrhea or constipation GU: Denies dysuria, frequency, hesitancy or incontinence MS: Denies muscle aches, joint pain or  swelling Neuro: Denies headache, neurologic deficits (focal weakness, numbness, tingling), abnormal gait Psych: Denies anxiety, depression, SI/HI/AVH Skin: Denies new rashes or lesions ID: Denies sick contacts, exotic exposures, travel  Examination:  General exam: Appears calm and comfortable  Respiratory system: Bilateral expiratory wheezing Cardiovascular  system: S1 & S2 heard, RRR. No JVD, murmurs, rubs, gallops or clicks. No pedal edema. Gastrointestinal system: Abdomen is nondistended, soft and nontender. No organomegaly or masses felt. Normal bowel sounds heard. Central nervous system: Alert and oriented. No focal neurological deficits. Extremities: Symmetric 5 x 5 power. Skin:Rectal/groin area boil note/ with some foul-smelling discharge. Psychiatry: Judgement and insight appear normal. Mood & affect appropriate.   Female RN students in the room with me during examination as a chaperone  Objective: Vitals:   07/04/21 2047 07/05/21 0141 07/05/21 0149 07/05/21 0500  BP:      Pulse:      Resp:   17   Temp:      TempSrc:      SpO2: 100% 100%    Weight:    98 kg  Height:        Intake/Output Summary (Last 24 hours) at 07/05/2021 0818 Last data filed at 07/04/2021 1642 Gross per 24 hour  Intake 740 ml  Output 851 ml  Net -111 ml   Filed Weights   07/01/21 0340 07/05/21 0500  Weight: 104.3 kg 98 kg     Data Reviewed:   CBC: Recent Labs  Lab 07/01/21 0650 07/02/21 0423 07/02/21 2019 07/03/21 0246 07/04/21 0137 07/05/21 0337  WBC 35.9* 19.9*  --  14.8* 13.7* 10.9*  NEUTROABS 33.5*  --   --   --   --   --   HGB 7.0* 6.0* 8.5* 8.1* 8.6* 8.1*  HCT 24.0* 20.0* 27.0* 25.9* 27.6* 27.1*  MCV 79.2* 76.9*  --  79.4* 79.8* 81.9  PLT 321 290  --  258 269 941   Basic Metabolic Panel: Recent Labs  Lab 07/01/21 0401 07/02/21 0622 07/03/21 0246 07/04/21 0137 07/05/21 0337  NA 135 136 135 134* 138  K 3.5 2.7* 3.7 4.3 4.1  CL 92* 95* 96* 98 99  CO2 29 29 29 27 30   GLUCOSE 145* 191* 158* 154* 146*  BUN 160* 149* 141* 136* 126*  CREATININE 2.90* 2.63* 2.58* 2.37* 2.20*  CALCIUM 9.2 8.9 8.5* 8.6* 8.9   GFR: Estimated Creatinine Clearance: 28.6 mL/min (A) (by C-G formula based on SCr of 2.2 mg/dL (H)). Liver Function Tests: Recent Labs  Lab 07/01/21 0401  AST 22  ALT 14  ALKPHOS 70  BILITOT 0.6  PROT 6.6  ALBUMIN  3.0*   No results for input(s): LIPASE, AMYLASE in the last 168 hours. No results for input(s): AMMONIA in the last 168 hours. Coagulation Profile: Recent Labs  Lab 07/01/21 0401  INR 1.0   Cardiac Enzymes: Recent Labs  Lab 07/01/21 1829  CKTOTAL 70   BNP (last 3 results) No results for input(s): PROBNP in the last 8760 hours. HbA1C: No results for input(s): HGBA1C in the last 72 hours. CBG: Recent Labs  Lab 07/03/21 2102 07/04/21 0830 07/04/21 1106 07/04/21 1636 07/04/21 2028  GLUCAP 176* 130* 210* 140* 183*   Lipid Profile: No results for input(s): CHOL, HDL, LDLCALC, TRIG, CHOLHDL, LDLDIRECT in the last 72 hours. Thyroid Function Tests: No results for input(s): TSH, T4TOTAL, FREET4, T3FREE, THYROIDAB in the last 72 hours. Anemia Panel: No results for input(s): VITAMINB12, FOLATE, FERRITIN, TIBC, IRON, RETICCTPCT in the last 72 hours.  Sepsis Labs: Recent Labs  Lab 07/01/21 0401 07/01/21 1829  PROCALCITON  --  3.00  LATICACIDVEN 1.5  --     Recent Results (from the past 240 hour(s))  Urine Culture     Status: None   Collection Time: 07/01/21  3:21 AM   Specimen: In/Out Cath Urine  Result Value Ref Range Status   Specimen Description IN/OUT CATH URINE  Final   Special Requests NONE  Final   Culture   Final    NO GROWTH Performed at [facility], [address], [place], [place] [address]    Report Status 07/03/2021 FINAL  Final  Blood culture (routine single)     Status: None (Preliminary result)   Collection Time: 07/01/21  4:01 AM   Specimen: BLOOD  Result Value Ref Range Status   Specimen Description BLOOD RIGHT ANTECUBITAL  Final   Special Requests   Final    BOTTLES DRAWN AEROBIC AND ANAEROBIC Blood Culture adequate volume   Culture   Final    NO GROWTH 3 DAYS Performed at [facility], [place] [address] [place], [place] [address]    Report Status PENDING  Incomplete  SARS CORONAVIRUS 2 (TAT 6-24 HRS) Nasopharyngeal Nasopharyngeal  Swab     Status: None   Collection Time: 07/01/21 10:26 AM   Specimen: Nasopharyngeal Swab  Result Value Ref Range Status   SARS Coronavirus 2 NEGATIVE NEGATIVE Final    Comment: (NOTE) SARS-CoV-2 target nucleic acids are NOT DETECTED.  The SARS-CoV-2 RNA is generally detectable in upper and lower respiratory specimens during the acute phase of infection. Negative results do not preclude SARS-CoV-2 infection, do not rule out co-infections with other pathogens, and should not be used as the sole basis for treatment or other patient management decisions. Negative results must be combined with clinical observations, patient history, and epidemiological information. The expected result is Negative.  Fact Sheet for Patients: SugarRoll.be  Fact Sheet for Healthcare Providers: https://www.woods-mathews.com/  This test is not yet approved or cleared by the [place] FDA and  has been authorized for detection and/or diagnosis of SARS-CoV-2 by FDA under an Emergency Use Authorization (EUA). This EUA will remain  in effect (meaning this test can be used) for the duration of the COVID-19 declaration under Se ction 564(b)(1) of the Act, 21 U.S.C. section 360bbb-3(b)(1), unless the authorization is terminated or revoked sooner.  Performed at [facility], [place] [address], [place], [place] [address]          Radiology Studies: DG CHEST PORT 1 VIEW  Result Date: 07/04/2021 CLINICAL DATA:  Shortness of breath, cough, and congestion. EXAM: PORTABLE CHEST 1 VIEW COMPARISON:  07/02/2021 FINDINGS: The cardiac silhouette remains enlarged. The interstitial markings are mildly increased diffusely compared to the prior study. No acute airspace consolidation, sizeable pleural effusion, or pneumothorax is identified. 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 IMPRESSION: Cardiomegaly with increased interstitial  markings which may reflect early edema. Electronically Signed   By: Logan Bores M.D.   On: 07/04/2021 08:40        Scheduled Meds:  allopurinol  100 mg Oral Daily   carvedilol  12.5 mg Oral BID   cinacalcet  30 mg Oral Daily   clopidogrel  75 mg Oral Daily   feeding supplement (GLUCERNA SHAKE)  237 mL Oral BID BM   furosemide  80 mg Intravenous Q12H   guaiFENesin  600 mg Oral BID  heparin  5,000 Units Subcutaneous Q12H   insulin aspart  0-9 Units Subcutaneous TID WC   ipratropium-albuterol  3 mL Nebulization Q6H   levothyroxine  200 mcg Oral Q0600   metoCLOPramide  5 mg Oral TID   multivitamin with minerals  1 tablet Oral Daily   pantoprazole  40 mg Oral Daily   polyethylene glycol  17 g Oral Daily   Ensure Max Protein  11 oz Oral QHS   sodium chloride flush  3 mL Intravenous Q12H   Continuous Infusions:   ceFAZolin (ANCEF) IV 2 g (07/04/21 2120)   metronidazole 500 mg (07/04/21 2308)     LOS: 3 days   Time spent= 35 mins    Ritik Stavola Arsenio Loader, MD [facility]  If 7PM-7AM, please contact night-coverage  07/05/2021, 8:18 AM

## 2021-07-05 NOTE — TOC Progression Note (Signed)
Transition of Care [facility]) - Initial/Assessment Note    Patient Details  Name: [name] [name] MRN: [id number] Date of Birth: Mar 25, 1951  Transition of Care [facility]) CM/SW Contact:    Milinda Antis, [place] Phone Number: 07/05/2021, 3:58 PM  Clinical Narrative:                 CSW met with patient at bedside to discuss d/c planning and present bed offers.  Patient was short of breath and reported being in severe pain.  CSW notified RN and will return to speak with patient at a later date.    Expected Discharge Plan: Skilled Nursing Facility Barriers to Discharge: Insurance Authorization, SNF Pending bed offer   Patient Goals and CMS Choice Patient states their goals for this hospitalization and ongoing recovery are:: To return home CMS Medicare.gov Compare Post Acute Care list provided to:: Patient Choice offered to / list presented to : Patient  Expected Discharge Plan and Services Expected Discharge Plan: [place]       Living arrangements for the past 2 months: Apartment                                      Prior Living Arrangements/Services Living arrangements for the past 2 months: Apartment Lives with:: Adult Children Patient language and need for interpreter reviewed:: Yes Do you feel safe going back to the place where you live?: Yes      Need for Family Participation in Patient Care: Yes (Comment) Care giver support system in place?: No (comment)   Criminal Activity/Legal Involvement Pertinent to Current Situation/Hospitalization: No - Comment as needed  Activities of Daily Living Home Assistive Devices/Equipment: CBG Meter, Blood pressure cuff, Cane (specify quad or straight), Eyeglasses, Grab bars in shower ADL Screening (condition at time of admission) Patient's cognitive ability adequate to safely complete daily activities?: Yes Is the patient deaf or have difficulty hearing?: No Does the patient have difficulty seeing, even when wearing  glasses/contacts?: No Does the patient have difficulty concentrating, remembering, or making decisions?: No Patient able to express need for assistance with ADLs?: Yes Does the patient have difficulty dressing or bathing?: No Independently performs ADLs?: No Communication: Independent Dressing (OT): Needs assistance Is this a change from baseline?: Change from baseline, expected to last <3days Grooming: Independent Feeding: Independent Bathing: Needs assistance Is this a change from baseline?: Change from baseline, expected to last <3 days Toileting: Needs assistance Is this a change from baseline?: Change from baseline, expected to last <3 days In/Out Bed: Needs assistance Is this a change from baseline?: Change from baseline, expected to last <3 days Walks in Home: Needs assistance Is this a change from baseline?: Change from baseline, expected to last <3 days Does the patient have difficulty walking or climbing stairs?: Yes Weakness of Legs: Both Weakness of Arms/Hands: Both  Permission Sought/Granted   Permission granted to share information with : Yes, Verbal Permission Granted     Permission granted to share info w AGENCY: SNF        Emotional Assessment Appearance:: Appears stated age Attitude/Demeanor/Rapport: Engaged Affect (typically observed): Accepting, Pleasant Orientation: : Oriented to Self, Oriented to Place, Oriented to  Time, Oriented to Situation Alcohol / Substance Use: Not Applicable Psych Involvement: No (comment)  Admission diagnosis:  Cellulitis of leg, right [L03.115] Cellulitis [L03.90] Patient Active Problem List   Diagnosis Date Noted   Chronic diastolic (congestive)  heart failure ([place]) 07/01/2021   Chronic pain syndrome 07/01/2021   COPD (chronic obstructive pulmonary disease) ([place]) 07/01/2021   Cellulitis of leg, right 07/01/2021   SARS-CoV-2 positive 02/21/2021   Increased weakness when ambulating 02/17/2021   Intractable nausea and  vomiting 02/15/2021   Symptomatic anemia 01/24/2021   Anemia, unspecified 01/08/2020   Bacteremia 01/07/2020   UTI (urinary tract infection) 01/06/2020   Anemia 10/26/2019   S/P lumbar fusion 09/18/2019   Chronic CHF (congestive heart failure) ([place]) 03/16/2019   Leukocytosis 03/16/2019   Chest pain 11/27/2018   Pre-syncope 11/27/2018   Epigastric abdominal pain 11/27/2018   Solitary pulmonary nodule 11/06/2018   Hypertensive heart and renal disease, stage 1-4 or unspecified chronic kidney disease, with heart failure ([place]) 10/20/2018   CHF (congestive heart failure), NYHA class III, acute on chronic, systolic ([place]) 96/01/5408   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, unspecified extremity ([place]) 04/19/2016   Cellulitis of abdominal wall 03/21/2015   Acute on chronic renal failure ([place]) 03/21/2015   DM (diabetes mellitus), type 2 with renal complications ([place]) 81/19/1478   Abdominal wall abscess 03/21/2015   Failed total hip arthroplasty ([place]) 08/11/2014   Low urine output 08/11/2014   Acute on chronic systolic CHF (congestive heart failure), NYHA class 3 ([place]) 08/11/2014   Asthma, chronic 08/11/2014   Hypothyroidism 08/11/2014   OSA (obstructive sleep apnea) 08/11/2014   Positive cardiac stress test 06/29/2014   Hyperlipidemia 02/12/2014   Left arm weakness 12/23/2013   TIA (transient ischemic attack) 12/23/2013   Left buttock abscess 10/12/2013   Cellulitis and abscess of leg, right 04/16/2013   Acute blood loss anemia 02/14/2013   HTN (hypertension) 02/14/2013   Cellulitis and abscess of buttock 02/09/2013   Class 2 obesity due to excess calories with body mass index (BMI) of 36.0 to 36.9 in adult 02/03/2013   Diarrhea 02/03/2013   Hyponatremia 02/03/2013   Sepsis due to undetermined organism ([place]) 02/02/2013   Diabetes mellitus without complication ([place]) 29/56/2130   CKD (chronic kidney disease), stage IV ([place]) 02/02/2013   Cellulitis 02/02/2013    PAD (peripheral artery disease) ([place]) 04/26/2011   Tobacco abuse 04/26/2011   PCP:  [name], MD Pharmacy:   [facility] [place], [address] AT [address] [place] [address] Phone: [phone] Fax: [phone]  [place] [facility] - [place]), [place] - [address] [place] [address] Phone: [phone] Fax: [phone]     Social Determinants of Health (SDOH) Interventions    Readmission Risk Interventions Readmission Risk Prevention Plan 01/14/2020  Transportation Screening Complete  Medication Review (RN Care Manager) Complete  PCP or Specialist appointment within 3-5 days of discharge Complete  HRI or Home Care Consult Complete  SW Recovery Care/Counseling Consult Complete  Palliative Care Screening Not Applicable  Skilled Nursing Facility Complete  Some recent data might be hidden

## 2021-07-05 NOTE — Progress Notes (Signed)
Nutrition Follow-up  DOCUMENTATION CODES:   Obesity unspecified  INTERVENTION:   -D/c Glucerna shake -Continue Ensure Max po daily, each supplement provides 150 kcal and 30 grams of protein -Continue MVI with minerals daily  NUTRITION DIAGNOSIS:   Increased nutrient needs related to acute illness as evidenced by estimated needs.  Ongoing  GOAL:   Patient will meet greater than or equal to 90% of their needs  Progressing  MONITOR:   PO intake, Supplement acceptance, Diet advancement, Labs, Weight trends, Skin, I & O's  REASON FOR ASSESSMENT:   Malnutrition Screening Tool    ASSESSMENT:   [name] [name] is a 70 y.o. female with medical history significant of chronic diastolic CHF; chronic pain; COPD; DM; HTN; HLD; hypothyroidism; OSA not on CPAP; obesity; stage 4 CKD; and PVD presenting with RLE swelling.  She was reports that she was "hurting real bad".  She reported BLE pain, neither worse than the other.  She was too somnolent to answer additional questions.  Per EDP note, she was very weak at [facility] and unable to ambulate without assistance.  I was unable to reach her son by telephone at the time of admission.  Reviewed I/O's: -111 ml x 24 hours and +895 ml since admission  UOP: 850 ml x 24 hours   Pt receiving nursing care at time of visit.   Pt remains with good appetite. Noted meal completion 100%. Pt is consuming Ensure Max supplements, however, has variable acceptance of Glucerna.   Per TOC notes, plan for SNF placement once medically stabel for discharge.   Medications reviewed and include lasix.   Labs reviewed: CBGS: [id number] (inpatient orders for glycemic control are 0-9 units insulin aspart TID with meals).    Diet Order:   Diet Order             Diet Carb Modified Fluid consistency: Thin; Room service appropriate? Yes  Diet effective ____                   EDUCATION NEEDS:   Education needs have been addressed  Skin:  Skin Assessment:  Reviewed RN Assessment  Last BM:  07/04/21 (type 2)  Height:   Ht Readings from Last 1 Encounters:  07/01/21 5\' 7"  (1.702 m)    Weight:   Wt Readings from Last 1 Encounters:  07/05/21 98 kg    Ideal Body Weight:  61.4 kg  BMI:  Body mass index is 33.84 kg/m.  Estimated Nutritional Needs:   Kcal:  1850-2050  Protein:  90-105 grams  Fluid:  > 1.8 L    Loistine Chance, RD, LDN, [place] Registered Dietitian II Certified Diabetes Care and Education Specialist Please refer to  [facility] for RD and/or RD on-call/weekend/after hours pager

## 2021-07-05 NOTE — Care Management Important Message (Signed)
Important Message  Patient Details  Name: [name] [name] MRN: [id number] Date of Birth: 05-25-1951   Medicare Important Message Given:  Yes     Adilen Pavelko 07/05/2021, 4:24 PM

## 2021-07-06 DIAGNOSIS — A419 Sepsis, unspecified organism: Secondary | ICD-10-CM | POA: Diagnosis not present

## 2021-07-06 LAB — MAGNESIUM: Magnesium: 1.6 mg/dL — ABNORMAL LOW (ref 1.7–2.4)

## 2021-07-06 LAB — BASIC METABOLIC PANEL
Anion gap: 10 (ref 5–15)
BUN: 119 mg/dL — ABNORMAL HIGH (ref 8–23)
CO2: 27 mmol/L (ref 22–32)
Calcium: 8.8 mg/dL — ABNORMAL LOW (ref 8.9–10.3)
Chloride: 99 mmol/L (ref 98–111)
Creatinine, Ser: 2.36 mg/dL — ABNORMAL HIGH (ref 0.44–1.00)
GFR, Estimated: 22 mL/min — ABNORMAL LOW (ref >60)
Glucose, Bld: 292 mg/dL — ABNORMAL HIGH (ref 70–99)
Potassium: 4.4 mmol/L (ref 3.5–5.1)
Sodium: 136 mmol/L (ref 135–145)

## 2021-07-06 LAB — CBC
HCT: 26.1 % — ABNORMAL LOW (ref 36.0–46.0)
Hemoglobin: 7.7 g/dL — ABNORMAL LOW (ref 12.0–15.0)
MCH: 24.3 pg — ABNORMAL LOW (ref 26.0–34.0)
MCHC: 29.5 g/dL — ABNORMAL LOW (ref 30.0–36.0)
MCV: 82.3 fL (ref 80.0–100.0)
Platelets: 278 10*3/uL (ref 150–400)
RBC: 3.17 MIL/uL — ABNORMAL LOW (ref 3.87–5.11)
RDW: 21.6 % — ABNORMAL HIGH (ref 11.5–15.5)
WBC: 9.3 10*3/uL (ref 4.0–10.5)
nRBC: 0 % (ref 0.0–0.2)

## 2021-07-06 LAB — CULTURE, BLOOD (SINGLE)
Culture: NO GROWTH
Special Requests: ADEQUATE

## 2021-07-06 LAB — GLUCOSE, CAPILLARY
Glucose-Capillary: 136 mg/dL — ABNORMAL HIGH (ref 70–99)
Glucose-Capillary: 182 mg/dL — ABNORMAL HIGH (ref 70–99)
Glucose-Capillary: 225 mg/dL — ABNORMAL HIGH (ref 70–99)
Glucose-Capillary: 242 mg/dL — ABNORMAL HIGH (ref 70–99)
Glucose-Capillary: 315 mg/dL — ABNORMAL HIGH (ref 70–99)

## 2021-07-06 LAB — OCCULT BLOOD X 1 CARD TO LAB, STOOL: Fecal Occult Bld: POSITIVE — AB

## 2021-07-06 LAB — BRAIN NATRIURETIC PEPTIDE: B Natriuretic Peptide: 854 pg/mL — ABNORMAL HIGH (ref 0.0–100.0)

## 2021-07-06 MED ORDER — LUBIPROSTONE 24 MCG PO CAPS
24.0000 ug | ORAL_CAPSULE | Freq: Two times a day (BID) | ORAL | Status: DC
  Administered 2021-07-06 – 2021-07-10 (×9): 24 ug via ORAL
  Filled 2021-07-06 (×10): qty 1

## 2021-07-06 NOTE — Progress Notes (Addendum)
PROGRESS NOTE    [name] [name]  EYC:[id number] DOB: [birth year] DOA: 07/01/2021 PCP: [name], MD   Brief Narrative:  70 year old with past medical history significant for chronic diastolic and systolic heart failure chronic pain, COPD, diabetes, hypertension, hyperlipidemia, hypothyroidism, OSA not on CPAP, obesity, stage IV CKD and PVD who presents complaining of pain in both her legs and feet.  She was also noted to have a fever.  She was admitted with left lower extremity cellulitis.  Hospital course complicated by worsening shortness of breath and acute on chronic heart failure exacerbation and COPD exacerbation..   Assessment & Plan:   Principal Problem:   Sepsis due to undetermined organism [facility]) Active Problems:   PAD (peripheral artery disease) ([place])   Diabetes mellitus without complication (HCC)   Cellulitis   Class 2 obesity due to excess calories with body mass index (BMI) of 36.0 to 36.9 in adult   Hyperlipidemia   Hypothyroidism   Chronic diastolic (congestive) heart failure (HCC)   Chronic pain syndrome   Cellulitis of leg, right  Left LE cellulitis with sepsis; improving -Sepsis physiology is improving - Empiric antibiotics-IV Ancef    Acute congestive heart failure with reduced ejection fraction, 45%, class III -BNP greater than 800.  Continue Lasix IV twice daily.  Replete electrolytes as needed. - Coreg 12.5 mg twice daily   COPD; Mild exacerbation Patient with worsening shortness of breath, bilateral wheezing, plan to schedule nebulizer scheduled and prn.  Complete 5 days of p.o. prednisone   CKD stage IV Creatinine baseline 2.5-3. Monitor on IV Lasix.   Anemia of chronic kidney disease: Hx of Erosive gastritis  -No obvious evidence of blood loss.  Status post 2 unit PRBC 9/25.  Hemoglobin slowly drifting downwards.  Will check FOBT, if positive will consult GI.  Addendum 445pm FOBT resulted positive. Previously she has been severely iron def,  therefore will repeat Iron studies. Per our records, it appears if this had persisted she was suppose to get C scope and/or Capsule study. I will consult [facility] for their input.    Chronic pain syndrome, Continue  with home medication   Peripheral vascular disease -Continue with Plavix  Diabetes type 2: -Trulicity on hold.  Sliding scale and Accu-Cheks.   Hypothyroidism:  -Continue with levothyroxine  OSA: Not on CPAP  Class 3 obesity: Needs lifestyle changes     Superficial boil in rectal area. Superficial dressing is ok.     DVT prophylaxis: heparin injection 5,000 Units Start: 07/01/21 1400 Code Status: Full Family Communication:    Status is: Inpatient  Remains inpatient appropriate because:Inpatient level of care appropriate due to severity of illness  Dispo: The patient is from: Home              Anticipated d/c is to: Home              Patient currently is not medically stable to d/c.   Difficult to place patient No       Nutritional status  Nutrition Problem: Increased nutrient needs Etiology: acute illness  Signs/Symptoms: estimated needs  Interventions: MVI, Premier Protein, Glucerna shake  Body mass index is 34.36 kg/m.           Subjective: Patient still feels quite lousy with exertional shortness of breath.  Her left lower extremity pain and swelling is significantly improved.  Breathing is better as well today.  Review of Systems Otherwise negative except as per HPI, including: General: Denies fever, chills, night  sweats or unintended weight loss. Resp: Denies cough, wheezing, shortness of breath. Cardiac: Denies chest pain, palpitations, orthopnea, paroxysmal nocturnal dyspnea. GI: Denies abdominal pain, nausea, vomiting, diarrhea or constipation GU: Denies dysuria, frequency, hesitancy or incontinence MS: Denies muscle aches, joint pain or swelling Neuro: Denies headache, neurologic deficits (focal weakness, numbness,  tingling), abnormal gait Psych: Denies anxiety, depression, SI/HI/AVH Skin: Denies new rashes or lesions ID: Denies sick contacts, exotic exposures, travel  Examination:  Constitutional: Not in acute distress Respiratory: Minimal bilateral rhonchi Cardiovascular: Normal sinus rhythm, no rubs Abdomen: Nontender nondistended good bowel sounds Musculoskeletal: No edema noted Skin: Lower extremity erythema and swelling has improved Neurologic: CN 2-12 grossly intact.  And nonfocal Psychiatric: Normal judgment and insight. Alert and oriented x 3. Normal mood.   Objective: Vitals:   07/06/21 0259 07/06/21 0500 07/06/21 0731 07/06/21 0815  BP:   133/75   Pulse:   83   Resp:      Temp:   98.4 F (36.9 C)   TempSrc:   Oral   SpO2: 100%  100% 99%  Weight:  99.5 kg    Height:        Intake/Output Summary (Last 24 hours) at 07/06/2021 1054 Last data filed at 07/05/2021 1500 Gross per 24 hour  Intake 240 ml  Output 800 ml  Net -560 ml   Filed Weights   07/01/21 0340 07/05/21 0500 07/06/21 0500  Weight: 104.3 kg 98 kg 99.5 kg     Data Reviewed:   CBC: Recent Labs  Lab 07/01/21 0650 07/02/21 0423 07/02/21 2019 07/03/21 0246 07/04/21 0137 07/05/21 0337 07/06/21 0110  WBC 35.9* 19.9*  --  14.8* 13.7* 10.9* 9.3  NEUTROABS 33.5*  --   --   --   --   --   --   HGB 7.0* 6.0* 8.5* 8.1* 8.6* 8.1* 7.7*  HCT 24.0* 20.0* 27.0* 25.9* 27.6* 27.1* 26.1*  MCV 79.2* 76.9*  --  79.4* 79.8* 81.9 82.3  PLT 321 290  --  258 269 263 423   Basic Metabolic Panel: Recent Labs  Lab 07/02/21 0622 07/03/21 0246 07/04/21 0137 07/05/21 0337 07/06/21 0110  NA 136 135 134* 138 136  K 2.7* 3.7 4.3 4.1 4.4  CL 95* 96* 98 99 99  CO2 29 29 27 30 27   GLUCOSE 191* 158* 154* 146* 292*  BUN 149* 141* 136* 126* 119*  CREATININE 2.63* 2.58* 2.37* 2.20* 2.36*  CALCIUM 8.9 8.5* 8.6* 8.9 8.8*  MG  --   --   --   --  1.6*   GFR: Estimated Creatinine Clearance: 26.9 mL/min (A) (by C-G formula  based on SCr of 2.36 mg/dL (H)). Liver Function Tests: Recent Labs  Lab 07/01/21 0401  AST 22  ALT 14  ALKPHOS 70  BILITOT 0.6  PROT 6.6  ALBUMIN 3.0*   No results for input(s): LIPASE, AMYLASE in the last 168 hours. No results for input(s): AMMONIA in the last 168 hours. Coagulation Profile: Recent Labs  Lab 07/01/21 0401  INR 1.0   Cardiac Enzymes: Recent Labs  Lab 07/01/21 1829  CKTOTAL 70   BNP (last 3 results) No results for input(s): PROBNP in the last 8760 hours. HbA1C: No results for input(s): HGBA1C in the last 72 hours. CBG: Recent Labs  Lab 07/05/21 0819 07/05/21 1145 07/05/21 1706 07/05/21 2121 07/06/21 0631  GLUCAP 136* 166* 144* 326* 182*   Lipid Profile: No results for input(s): CHOL, HDL, LDLCALC, TRIG, CHOLHDL, LDLDIRECT in the last 72  hours. Thyroid Function Tests: No results for input(s): TSH, T4TOTAL, FREET4, T3FREE, THYROIDAB in the last 72 hours. Anemia Panel: No results for input(s): VITAMINB12, FOLATE, FERRITIN, TIBC, IRON, RETICCTPCT in the last 72 hours. Sepsis Labs: Recent Labs  Lab 07/01/21 0401 07/01/21 1829  PROCALCITON  --  3.00  LATICACIDVEN 1.5  --     Recent Results (from the past 240 hour(s))  Urine Culture     Status: None   Collection Time: 07/01/21  3:21 AM   Specimen: In/Out Cath Urine  Result Value Ref Range Status   Specimen Description IN/OUT CATH URINE  Final   Special Requests NONE  Final   Culture   Final    NO GROWTH Performed at [facility], [address] [place], [place] [address]    Report Status 07/03/2021 FINAL  Final  Blood culture (routine single)     Status: None (Preliminary result)   Collection Time: 07/01/21  4:01 AM   Specimen: BLOOD  Result Value Ref Range Status   Specimen Description BLOOD RIGHT ANTECUBITAL  Final   Special Requests   Final    BOTTLES DRAWN AEROBIC AND ANAEROBIC Blood Culture adequate volume   Culture   Final    NO GROWTH 4 DAYS Performed at [facility], [place] [address] [place], [place] [address]    Report Status PENDING  Incomplete  SARS CORONAVIRUS 2 (TAT 6-24 HRS) Nasopharyngeal Nasopharyngeal Swab     Status: None   Collection Time: 07/01/21 10:26 AM   Specimen: Nasopharyngeal Swab  Result Value Ref Range Status   SARS Coronavirus 2 NEGATIVE NEGATIVE Final    Comment: (NOTE) SARS-CoV-2 target nucleic acids are NOT DETECTED.  The SARS-CoV-2 RNA is generally detectable in upper and lower respiratory specimens during the acute phase of infection. Negative results do not preclude SARS-CoV-2 infection, do not rule out co-infections with other pathogens, and should not be used as the sole basis for treatment or other patient management decisions. Negative results must be combined with clinical observations, patient history, and epidemiological information. The expected result is Negative.  Fact Sheet for Patients: SugarRoll.be  Fact Sheet for Healthcare Providers: https://www.woods-mathews.com/  This test is not yet approved or cleared by the [place] FDA and  has been authorized for detection and/or diagnosis of SARS-CoV-2 by FDA under an Emergency Use Authorization (EUA). This EUA will remain  in effect (meaning this test can be used) for the duration of the COVID-19 declaration under Se ction 564(b)(1) of the Act, 21 U.S.C. section 360bbb-3(b)(1), unless the authorization is terminated or revoked sooner.  Performed at [facility], [place] [address], [place], [place] [address]          Radiology Studies: No results found.      Scheduled Meds:  allopurinol  100 mg Oral Daily   carvedilol  12.5 mg Oral BID   cinacalcet  30 mg Oral Daily   clopidogrel  75 mg Oral Daily   furosemide  80 mg Intravenous Q12H   guaiFENesin  600 mg Oral BID   heparin  5,000 Units Subcutaneous Q12H   insulin aspart  0-9 Units Subcutaneous TID WC   ipratropium-albuterol  3 mL  Nebulization Q6H   levothyroxine  200 mcg Oral Q0600   metoCLOPramide  5 mg Oral TID   multivitamin with minerals  1 tablet Oral Daily   pantoprazole  40 mg Oral Daily   polyethylene glycol  17 g Oral Daily  predniSONE  40 mg Oral Q breakfast   Ensure Max Protein  11 oz Oral QHS   sodium chloride flush  3 mL Intravenous Q12H   Continuous Infusions:   ceFAZolin (ANCEF) IV 2 g (07/06/21 1000)     LOS: 4 days   Time spent= 35 mins    Jeiden Daughtridge Arsenio Loader, MD [facility]  If 7PM-7AM, please contact night-coverage  07/06/2021, 10:54 AM

## 2021-07-06 NOTE — Progress Notes (Signed)
Inpatient Diabetes Program Recommendations  AACE/ADA: New Consensus Statement on Inpatient Glycemic Control (2015)  Target Ranges:  Prepandial:   less than 140 mg/dL      Peak postprandial:   less than 180 mg/dL (1-2 hours)      Critically ill patients:  140 - 180 mg/dL   Lab Results  Component Value Date   GLUCAP 182 (H) 07/06/2021   HGBA1C 6.8 (H) 02/15/2021    Review of Glycemic Control Results for VIVYAN, BIGGERS (MRN [id number]) as of 07/06/2021 10:56  Ref. Range 07/05/2021 11:45 07/05/2021 17:06 07/05/2021 21:21 07/06/2021 06:31  Glucose-Capillary Latest Ref Range: 70 - 99 mg/dL 166 (H) 144 (H) 326 (H) 182 (H)   Diabetes history: Type 2 DM Outpatient Diabetes medications: Humalog 75/25 40 units EJY/11 units QPM, Trulicity 1.5 mg qwk Current orders for Inpatient glycemic control: Novolog 0-9 units TID Prednisone 40 mg QD  Inpatient Diabetes Program Recommendations:    Consider adding Novolog 0-5 units QHS and Novolog 3 units TID (Assuming patient is consuming >50% of meal).   Thanks, Bronson Curb, MSN, RNC-OB Diabetes Coordinator [phone] (8a-5p)

## 2021-07-06 NOTE — Progress Notes (Signed)
Occupational Therapy Treatment Patient Details Name: [name] [name] MRN: [id number] DOB: [birth year] Today's Date: 07/06/2021   History of present illness Pt is a 70 yo female admitted for cellulitis of the LLE.  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   OT comments  Focus of session on ADL training. Pt needing total assist for pericare and LB dressing, moderate assistance for UB bathing and min assist for UB dressing. Moderate assistance to achieve sitting EOB and to stand with RW. Min assist to take pivotal steps to transfer to [facility] and then recliner. Updated d/c recommendation to SNF.   Recommendations for follow up therapy are one component of a multi-disciplinary discharge planning process, led by the attending physician.  Recommendations may be updated based on patient status, additional functional criteria and insurance authorization.    Follow Up Recommendations  SNF;Supervision/Assistance - 24 hour    Equipment Recommendations  None recommended by OT    Recommendations for Other Services      Precautions / Restrictions Precautions Precautions: Fall       Mobility Bed Mobility Overal bed mobility: Needs Assistance Bed Mobility: Supine to Sit     Supine to sit: Mod assist     General bed mobility comments: assist to raise trunk and for hips to EOB    Transfers Overall transfer level: Needs assistance Equipment used: Rolling walker (2 wheeled) Transfers: Sit to/from [facility] Sit to Stand: Mod assist Stand pivot transfers: Min assist       General transfer comment: elevated bed, assist to rise and steady, cues for hand placement    Balance Overall balance assessment: Needs assistance   Sitting balance-Leahy Scale: Fair     Standing balance support: Bilateral upper extremity supported;During functional activity Standing balance-Leahy Scale: Poor Standing balance comment: RW and min assist to maintain  standing balance                           ADL either performed or assessed with clinical judgement   ADL Overall ADL's : Needs assistance/impaired     Grooming: Wash/dry hands;Wash/dry face;Sitting;Set up   Upper Body Bathing: Moderate assistance;Sitting Upper Body Bathing Details (indicate cue type and reason): for back and to get under skin folds on R     Upper Body Dressing : Minimal assistance;Sitting   Lower Body Dressing: Total assistance;Bed level       Toileting- Clothing Manipulation and Hygiene: Total assistance;Sit to/from stand               Vision       Perception     Praxis      Cognition Arousal/Alertness: Awake/alert Behavior During Therapy: WFL for tasks assessed/performed Overall Cognitive Status: Impaired/Different from baseline Area of Impairment: Memory                     Memory: Decreased short-term memory                  Exercises     Shoulder Instructions       General Comments      Pertinent Vitals/ Pain       Pain Assessment: Faces Faces Pain Scale: Hurts a little bit Pain Location: R shoulder, L LE Pain Descriptors / Indicators: Aching;Discomfort;Grimacing Pain Intervention(s): Monitored during session;Repositioned  Home Living  Prior Functioning/Environment              Frequency  Min 2X/week        Progress Toward Goals  OT Goals(current goals can now be found in the care plan section)  Progress towards OT goals: Progressing toward goals  Acute Rehab OT Goals Patient Stated Goal: to do what is needed to go home OT Goal Formulation: With patient Time For Goal Achievement: 07/17/21 Potential to Achieve Goals: Good  Plan Discharge plan needs to be updated    Co-evaluation                 AM-PAC OT "6 Clicks" Daily Activity     Outcome Measure   Help from another person eating meals?: None Help from  another person taking care of personal grooming?: A Little Help from another person toileting, which includes using toliet, bedpan, or urinal?: Total Help from another person bathing (including washing, rinsing, drying)?: A Lot Help from another person to put on and taking off regular upper body clothing?: A Little Help from another person to put on and taking off regular lower body clothing?: Total 6 Click Score: 14    End of Session Equipment Utilized During Treatment: Gait belt;Rolling walker  OT Visit Diagnosis: Unsteadiness on feet (R26.81)   Activity Tolerance Patient tolerated treatment well   Patient Left in chair;with call bell/phone within reach;with chair alarm set;with nursing/sitter in room   Nurse Communication Mobility status;Other (comment) (place 02 back on, pt sats at 94% on RA)        Time: 8032-1224 OT Time Calculation (min): 38 min  Charges: OT General Charges $OT Visit: 1 Visit OT Treatments $Self Care/Home Management : 38-52 mins  Nestor Lewandowsky, OTR/L Acute Rehabilitation Services Pager: [phone] Office: [phone]   [name] 07/06/2021, 1:07 PM

## 2021-07-06 NOTE — TOC Progression Note (Signed)
Transition of Care [facility]) - Initial/Assessment Note    Patient Details  Name: [name] [name] MRN: [id number] Date of Birth: 1951/06/27  Transition of Care [facility]) CM/SW Contact:    Milinda Antis, [place] Phone Number: 07/06/2021, 4:51 PM  Clinical Narrative:                 CSW met with the patient and the patient's sister, [name], at bedside to present bed offers.  The patient choose [facility].  CSW called Kitty with admissions at [facility] and left a message requesting a returned call to accept bed offer.   Expected Discharge Plan: Skilled Nursing Facility Barriers to Discharge: Insurance Authorization, SNF Pending bed offer   Patient Goals and CMS Choice Patient states their goals for this hospitalization and ongoing recovery are:: To return home CMS Medicare.gov Compare Post Acute Care list provided to:: Patient Choice offered to / list presented to : Patient  Expected Discharge Plan and Services Expected Discharge Plan: [place]       Living arrangements for the past 2 months: Apartment                                      Prior Living Arrangements/Services Living arrangements for the past 2 months: Apartment Lives with:: Adult Children Patient language and need for interpreter reviewed:: Yes Do you feel safe going back to the place where you live?: Yes      Need for Family Participation in Patient Care: Yes (Comment) Care giver support system in place?: No (comment)   Criminal Activity/Legal Involvement Pertinent to Current Situation/Hospitalization: No - Comment as needed  Activities of Daily Living Home Assistive Devices/Equipment: CBG Meter, Blood pressure cuff, Cane (specify quad or straight), Eyeglasses, Grab bars in shower ADL Screening (condition at time of admission) Patient's cognitive ability adequate to safely complete daily activities?: Yes Is the patient deaf or have difficulty hearing?: No Does the patient have  difficulty seeing, even when wearing glasses/contacts?: No Does the patient have difficulty concentrating, remembering, or making decisions?: No Patient able to express need for assistance with ADLs?: Yes Does the patient have difficulty dressing or bathing?: No Independently performs ADLs?: No Communication: Independent Dressing (OT): Needs assistance Is this a change from baseline?: Change from baseline, expected to last <3days Grooming: Independent Feeding: Independent Bathing: Needs assistance Is this a change from baseline?: Change from baseline, expected to last <3 days Toileting: Needs assistance Is this a change from baseline?: Change from baseline, expected to last <3 days In/Out Bed: Needs assistance Is this a change from baseline?: Change from baseline, expected to last <3 days Walks in Home: Needs assistance Is this a change from baseline?: Change from baseline, expected to last <3 days Does the patient have difficulty walking or climbing stairs?: Yes Weakness of Legs: Both Weakness of Arms/Hands: Both  Permission Sought/Granted   Permission granted to share information with : Yes, Verbal Permission Granted     Permission granted to share info w AGENCY: SNF        Emotional Assessment Appearance:: Appears stated age Attitude/Demeanor/Rapport: Engaged Affect (typically observed): Accepting, Pleasant Orientation: : Oriented to Self, Oriented to Place, Oriented to  Time, Oriented to Situation Alcohol / Substance Use: Not Applicable Psych Involvement: No (comment)  Admission diagnosis:  Cellulitis of leg, right [L03.115] Cellulitis [L03.90] Patient Active Problem List   Diagnosis Date Noted   Chronic  diastolic (congestive) heart failure (HCC) 07/01/2021   Chronic pain syndrome 07/01/2021   COPD (chronic obstructive pulmonary disease) ([place]) 07/01/2021   Cellulitis of leg, right 07/01/2021   SARS-CoV-2 positive 02/21/2021   Increased weakness when ambulating  02/17/2021   Intractable nausea and vomiting 02/15/2021   Symptomatic anemia 01/24/2021   Anemia, unspecified 01/08/2020   Bacteremia 01/07/2020   UTI (urinary tract infection) 01/06/2020   Anemia 10/26/2019   S/P lumbar fusion 09/18/2019   Chronic CHF (congestive heart failure) ([place]) 03/16/2019   Leukocytosis 03/16/2019   Chest pain 11/27/2018   Pre-syncope 11/27/2018   Epigastric abdominal pain 11/27/2018   Solitary pulmonary nodule 11/06/2018   Hypertensive heart and renal disease, stage 1-4 or unspecified chronic kidney disease, with heart failure ([place]) 10/20/2018   CHF (congestive heart failure), NYHA class III, acute on chronic, systolic ([place]) 16/07/9603   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, unspecified extremity ([place]) 04/19/2016   Cellulitis of abdominal wall 03/21/2015   Acute on chronic renal failure ([place]) 03/21/2015   DM (diabetes mellitus), type 2 with renal complications ([place]) 54/06/8118   Abdominal wall abscess 03/21/2015   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   Low urine output 08/11/2014   Acute on chronic systolic CHF (congestive heart failure), NYHA class 3 ([place]) 08/11/2014   Asthma, chronic 08/11/2014   Hypothyroidism 08/11/2014   OSA (obstructive sleep apnea) 08/11/2014   Positive cardiac stress test 06/29/2014   Hyperlipidemia 02/12/2014   Left arm weakness 12/23/2013   TIA (transient ischemic attack) 12/23/2013   Left buttock abscess 10/12/2013   Cellulitis and abscess of leg, right 04/16/2013   Acute blood loss anemia 02/14/2013   HTN (hypertension) 02/14/2013   Cellulitis and abscess of buttock 02/09/2013   Class 2 obesity due to excess calories with body mass index (BMI) of 36.0 to 36.9 in adult 02/03/2013   Diarrhea 02/03/2013   Hyponatremia 02/03/2013   Sepsis due to undetermined organism ([place]) 02/02/2013   Diabetes mellitus without complication ([place]) 14/78/2956   CKD (chronic kidney disease), stage IV ([place])  02/02/2013   Cellulitis 02/02/2013   PAD (peripheral artery disease) ([place]) 04/26/2011   Tobacco abuse 04/26/2011   PCP:  [name], MD Pharmacy:   [facility] [place], [address] AT [place] [address] Phone: [phone] Fax: [phone]  [facility] - [place] ([place]), [place] - [address] [place] [address] Phone: [phone] Fax: [phone]     Social Determinants of Health (SDOH) Interventions    Readmission Risk Interventions Readmission Risk Prevention Plan 01/14/2020  Transportation Screening Complete  Medication Review (RN Care Manager) Complete  PCP or Specialist appointment within 3-5 days of discharge Complete  HRI or Home Care Consult Complete  SW Recovery Care/Counseling Consult Complete  Palliative Care Screening Not Applicable  Skilled Nursing Facility Complete  Some recent data might be hidden

## 2021-07-06 NOTE — Progress Notes (Signed)
Physical Therapy Treatment Patient Details Name: [name] [name] MRN: [id number] DOB: 24-Aug-1951 Today's Date: 07/06/2021   History of Present Illness Pt is a 70 yo female admitted for cellulitis of the LLE.  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    PT Comments    Pt making slow, steady progress. Able to amb a short distance in the room with assist. Continue to recommend ST-SNF.    Recommendations for follow up therapy are one component of a multi-disciplinary discharge planning process, led by the attending physician.  Recommendations may be updated based on patient status, additional functional criteria and insurance authorization.  Follow Up Recommendations  SNF     Equipment Recommendations  None recommended by PT    Recommendations for Other Services       Precautions / Restrictions Precautions Precautions: Fall     Mobility  Bed Mobility Overal bed mobility: Needs Assistance Bed Mobility: Sit to Sidelying     Supine to sit: Mod assist   Sit to sidelying: Mod assist General bed mobility comments: Assist to bring LE's back up into the bed    Transfers Overall transfer level: Needs assistance Equipment used: Rolling walker (2 wheeled) Transfers: Sit to/from Stand Sit to Stand: Mod assist Stand pivot transfers: Min assist       General transfer comment: Assist to bring hips up. Slow to rise  Ambulation/Gait Ambulation/Gait assistance: Min assist Gait Distance (Feet): 15 Feet Assistive device: Rolling walker (2 wheeled) Gait Pattern/deviations: Step-to pattern;Decreased step length - right;Decreased step length - left;Shuffle Gait velocity: decr Gait velocity interpretation: <1.31 ft/sec, indicative of household ambulator General Gait Details: Assist for balance and support. Pt with dyspnea 3/4.   Stairs             Wheelchair Mobility    Modified Rankin (Stroke Patients Only)       Balance Overall  balance assessment: Needs assistance Sitting-balance support: No upper extremity supported;Feet supported Sitting balance-Leahy Scale: Fair     Standing balance support: Bilateral upper extremity supported;During functional activity Standing balance-Leahy Scale: Poor Standing balance comment: walker and min assist for static standing               High Level Balance Comments: SpO2 97% on RA after amb            Cognition Arousal/Alertness: Awake/alert Behavior During Therapy: WFL for tasks assessed/performed Overall Cognitive Status: Within Functional Limits for tasks assessed Area of Impairment: Memory                     Memory: Decreased short-term memory                Exercises      General Comments        Pertinent Vitals/Pain Pain Assessment: Faces Faces Pain Scale: Hurts a little bit Pain Location: LLE Pain Descriptors / Indicators: Grimacing;Guarding Pain Intervention(s): Limited activity within patient's tolerance    Home Living                      Prior Function            PT Goals (current goals can now be found in the care plan section) Acute Rehab PT Goals Patient Stated Goal: to do what is needed to go home Progress towards PT goals: Progressing toward goals    Frequency    Min 2X/week  PT Plan Current plan remains appropriate    Co-evaluation              AM-PAC PT "6 Clicks" Mobility   Outcome Measure  Help needed turning from your back to your side while in a flat bed without using bedrails?: A Little Help needed moving from lying on your back to sitting on the side of a flat bed without using bedrails?: A Lot Help needed moving to and from a bed to a chair (including a wheelchair)?: A Lot Help needed standing up from a chair using your arms (e.g., wheelchair or bedside chair)?: A Lot Help needed to walk in hospital room?: A Little Help needed climbing 3-5 steps with a railing? : Total 6  Click Score: 13    End of Session Equipment Utilized During Treatment: Gait belt Activity Tolerance: Patient limited by fatigue Patient left: in bed;with call bell/phone within reach;with bed alarm set Nurse Communication: Other (comment) ([place] removed) PT Visit Diagnosis: Muscle weakness (generalized) (M62.81);Unsteadiness on feet (R26.81)     Time: 6712-4580 PT Time Calculation (min) (ACUTE ONLY): 21 min  Charges:  $Gait Training: 8-22 mins                     [place] Pager [phone] Office [place] 07/06/2021, 2:54 PM

## 2021-07-07 ENCOUNTER — Encounter (HOSPITAL_COMMUNITY)
Admission: EM | Disposition: A | Discharge status: Skilled Nursing Facility | Payer: Self-pay | Source: Home / Self Care | Attending: Internal Medicine

## 2021-07-07 DIAGNOSIS — A419 Sepsis, unspecified organism: Secondary | ICD-10-CM | POA: Diagnosis not present

## 2021-07-07 HISTORY — PX: GIVENS CAPSULE STUDY: SHX5432

## 2021-07-07 LAB — BASIC METABOLIC PANEL
Anion gap: 10 (ref 5–15)
BUN: 122 mg/dL — ABNORMAL HIGH (ref 8–23)
CO2: 28 mmol/L (ref 22–32)
Calcium: 9.1 mg/dL (ref 8.9–10.3)
Chloride: 99 mmol/L (ref 98–111)
Creatinine, Ser: 2.67 mg/dL — ABNORMAL HIGH (ref 0.44–1.00)
GFR, Estimated: 19 mL/min — ABNORMAL LOW (ref >60)
Glucose, Bld: 234 mg/dL — ABNORMAL HIGH (ref 70–99)
Potassium: 4.2 mmol/L (ref 3.5–5.1)
Sodium: 137 mmol/L (ref 135–145)

## 2021-07-07 LAB — FERRITIN: Ferritin: 46 ng/mL (ref 11–307)

## 2021-07-07 LAB — IRON AND TIBC
Iron: 22 ug/dL — ABNORMAL LOW (ref 28–170)
Saturation Ratios: 7 % — ABNORMAL LOW (ref 10.4–31.8)
TIBC: 326 ug/dL (ref 250–450)
UIBC: 304 ug/dL

## 2021-07-07 LAB — GLUCOSE, CAPILLARY
Glucose-Capillary: 172 mg/dL — ABNORMAL HIGH (ref 70–99)
Glucose-Capillary: 156 mg/dL — ABNORMAL HIGH (ref 70–99)
Glucose-Capillary: 327 mg/dL — ABNORMAL HIGH (ref 70–99)
Glucose-Capillary: 323 mg/dL — ABNORMAL HIGH (ref 70–99)

## 2021-07-07 LAB — CBC
HCT: 28.1 % — ABNORMAL LOW (ref 36.0–46.0)
Hemoglobin: 8.4 g/dL — ABNORMAL LOW (ref 12.0–15.0)
MCH: 24.3 pg — ABNORMAL LOW (ref 26.0–34.0)
MCHC: 29.9 g/dL — ABNORMAL LOW (ref 30.0–36.0)
MCV: 81.2 fL (ref 80.0–100.0)
Platelets: 302 10*3/uL (ref 150–400)
RBC: 3.46 MIL/uL — ABNORMAL LOW (ref 3.87–5.11)
RDW: 21.8 % — ABNORMAL HIGH (ref 11.5–15.5)
WBC: 12.3 10*3/uL — ABNORMAL HIGH (ref 4.0–10.5)
nRBC: 0 % (ref 0.0–0.2)

## 2021-07-07 LAB — MAGNESIUM: Magnesium: 1.6 mg/dL — ABNORMAL LOW (ref 1.7–2.4)

## 2021-07-07 SURGERY — IMAGING PROCEDURE, GI TRACT, INTRALUMINAL, VIA CAPSULE
Anesthesia: LOCAL

## 2021-07-07 MED ORDER — MAGNESIUM OXIDE -MG SUPPLEMENT 400 (240 MG) MG PO TABS
800.0000 mg | ORAL_TABLET | ORAL | Status: AC
  Administered 2021-07-07 (×2): 800 mg via ORAL
  Filled 2021-07-07 (×2): qty 2

## 2021-07-07 MED ORDER — SODIUM CHLORIDE 0.9 % IV SOLN
250.0000 mg | Freq: Every day | INTRAVENOUS | Status: AC
  Administered 2021-07-07 – 2021-07-08 (×2): 250 mg via INTRAVENOUS
  Filled 2021-07-07 (×2): qty 20

## 2021-07-07 MED ORDER — INSULIN ASPART 100 UNIT/ML IJ SOLN
0.0000 [IU] | Freq: Three times a day (TID) | INTRAMUSCULAR | Status: DC
  Administered 2021-07-07: 7 [IU] via SUBCUTANEOUS
  Administered 2021-07-07: 9 [IU] via SUBCUTANEOUS
  Administered 2021-07-08: 7 [IU] via SUBCUTANEOUS
  Administered 2021-07-08: 9 [IU] via SUBCUTANEOUS
  Administered 2021-07-08: 2 [IU] via SUBCUTANEOUS
  Administered 2021-07-08: 3 [IU] via SUBCUTANEOUS
  Administered 2021-07-09: 9 [IU] via SUBCUTANEOUS
  Administered 2021-07-09: 5 [IU] via SUBCUTANEOUS
  Administered 2021-07-09: 9 [IU] via SUBCUTANEOUS
  Administered 2021-07-09: 3 [IU] via SUBCUTANEOUS
  Administered 2021-07-10: 5 [IU] via SUBCUTANEOUS
  Administered 2021-07-10: 7 [IU] via SUBCUTANEOUS
  Administered 2021-07-10: 2 [IU] via SUBCUTANEOUS

## 2021-07-07 MED ORDER — PANTOPRAZOLE SODIUM 40 MG IV SOLR
40.0000 mg | Freq: Two times a day (BID) | INTRAVENOUS | Status: DC
  Administered 2021-07-07 – 2021-07-08 (×3): 40 mg via INTRAVENOUS
  Filled 2021-07-07 (×4): qty 40

## 2021-07-07 MED ORDER — FUROSEMIDE 10 MG/ML IJ SOLN: 80.0000 mg | Freq: Every day | INTRAMUSCULAR | Status: DC

## 2021-07-07 MED ORDER — PEG 3350-KCL-NA BICARB-NACL 420 G PO SOLR
4000.0000 mL | Freq: Once | ORAL | Status: DC
  Filled 2021-07-07: qty 4000

## 2021-07-07 SURGICAL SUPPLY — 1 items: TOWEL COTTON PACK 4EA (MISCELLANEOUS) ×4 IMPLANT

## 2021-07-07 NOTE — Progress Notes (Signed)
Inpatient Diabetes Program Recommendations  AACE/ADA: New Consensus Statement on Inpatient Glycemic Control (2015)  Target Ranges:  Prepandial:   less than 140 mg/dL      Peak postprandial:   less than 180 mg/dL (1-2 hours)      Critically ill patients:  140 - 180 mg/dL   Lab Results  Component Value Date   GLUCAP 172 (H) 07/07/2021   HGBA1C 6.8 (H) 02/15/2021    Review of Glycemic Control Results for [name] [name], [name] [name] (MRN [id number]) as of 07/07/2021 10:43  Ref. Range 07/06/2021 11:38 07/06/2021 16:07 07/06/2021 21:52 07/07/2021 07:27  Glucose-Capillary Latest Ref Range: 70 - 99 mg/dL 225 (H) 242 (H) 315 (H) 172 (H)   Diabetes history: Type 2 DM Outpatient Diabetes medications: Humalog 75/25 40 units MVE/72 units QPM, Trulicity 1.5 mg qwk Current orders for Inpatient glycemic control: Novolog 0-9 units TID Prednisone 40 mg QD   Inpatient Diabetes Program Recommendations:     Consider adding Novolog 0-5 units QHS and Novolog 3 units TID (Assuming patient is consuming >50% of meal) once diet resumes.   Thanks, Bronson Curb, MSN, RNC-OB Diabetes Coordinator [phone] (8a-5p)

## 2021-07-07 NOTE — Consult Note (Addendum)
Referring Provider: [facility] Primary Care Physician:  [name], MD Primary Gastroenterologist:  Dr. Watt [facility])  Reason for Consultation:  Anemia, heme-positive stool  HPI: [name] [name] is a 70 y.o. female with history of CHF, COPD, IDA, DM type 2, currently hospitalized for lower extremity cellulitis with sepsis presenting for consultation of anemia and heme-positive stool.  Patient has been hospitalized since 9/24 for sepsis due to lower extremity cellulitis.  She was noted to have Hgb of 7.0 on admission 9/24, which decreased to 6.0 on 9/30.  Hemoccult yesterday was positive.  Patient denies changes in stool, melena, or hematochezia, though per review, patient reported dark stool to hospitalist.  Denies abdominal pain, nausea, or vomiting.  Does note some GERD.  Denies dysphagia.  Denies family history of colon cancer or gastrointestinal malignancy.  On Plavis.  Denies ASA or NSAID.  EGD 01/2021: question of tiny HH, erosive gastritis with flecks of old blood. They actually look like Cameron erosions which usually go along with a significant hiatal hernia which she does not have. Hematin (altered blood/coffee-ground-like material) in the stomach. Chronic gastritis. No specimens collected.  Colonoscopy 07/2017: Diverticulosis, internal hemorrhoids  Past Medical History:  Diagnosis Date   Anemia    Arthritis    Asthma    Chronic diastolic (congestive) heart failure (HCC)    Chronic kidney disease    Chronic pain syndrome    Class 2 obesity due to excess calories with body mass index (BMI) of 36.0 to 36.9 in adult    COPD (chronic obstructive pulmonary disease) (HCC)    Diabetes mellitus without complication (HCC)    Full dentures    GERD (gastroesophageal reflux disease)    Gout    History of hiatal hernia    History of transfusion    Hx MRSA infection    abscess left groin   Hyperlipidemia    Hypertension    Hypothyroid    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HCC)     Peripheral vascular disease (HCC)    Sleep apnea    UNABLE TO TOLERATE C PAP   Stress incontinence    TIA (transient ischemic attack)     X2 NO RESIDUAL PROBLEMS   Wears glasses     Past Surgical History:  Procedure Laterality Date   ABDOMINAL HYSTERECTOMY     BACK SURGERY  0258   BASCILIC VEIN TRANSPOSITION Left 04/05/2020   Procedure: BASILIC VEIN TRANSPOSITION FIRST STAGE;  Surgeon: Angelia Mould, MD;  Location: [facility] OR;  Service: Vascular;  Laterality: Left;   COLON SURGERY  1995   DUE TO POLYP   DILATION AND CURETTAGE OF UTERUS     ESOPHAGOGASTRODUODENOSCOPY N/A 01/25/2021   Procedure: ESOPHAGOGASTRODUODENOSCOPY (EGD);  Surgeon: Clarene Essex, MD;  Location: [facility];  Service: Endoscopy;  Laterality: N/A;   FOREARM FRACTURE SURGERY     Left arm   HERNIA REPAIR     w/ mesh   INCISION AND DRAINAGE ABSCESS Left 02/04/2013   Procedure: INCISION AND DRAINAGE LEFT BUTTOCK ABSCESS; INCISION AND DRAINAGE LEFT BREAST ABSCESS;  Surgeon: Harl Bowie, MD;  Location: [place];  Service: General;  Laterality: Left;   INCISION AND DRAINAGE ABSCESS N/A 02/12/2013   Procedure: INCISION AND DEBRIDEMENT BUTTOCK WOUND ;  Surgeon: [name]. Georgette Dover, MD;  Location: [place];  Service: General;  Laterality: N/A;   INCISION AND DRAINAGE ABSCESS N/A 02/14/2013   Procedure: INCISION AND DRAINAGE/DRESSING CHANGE;  Surgeon: Harl Bowie, MD;  Location: [place];  Service:  General;  Laterality: N/A;   INCISION AND DRAINAGE ABSCESS N/A 03/21/2015   Procedure: INCISION AND DRAINAGE PUBIC ABSCESS;  Surgeon: Excell Seltzer, MD;  Location: [facility];  Service: General;  Laterality: N/A;   INCISION AND DRAINAGE PERIRECTAL ABSCESS Left 02/10/2013   Procedure: IRRIGATION AND DEBRIDEMENT OF BUTTOCK/PERINEAL ABSCESS;  Surgeon: [name]. Georgette Dover, MD;  Location: [place];  Service: General;  Laterality: Left;   INCISION AND DRAINAGE PERIRECTAL ABSCESS N/A 02/16/2013   Procedure: IRRIGATION AND DEBRIDEMENT PERINEAL  ABSCESS;  Surgeon: Zenovia Jarred, MD;  Location: [place];  Service: General;  Laterality: N/A;   IRRIGATION AND DEBRIDEMENT ABSCESS N/A 02/06/2013   Procedure: IRRIGATION AND DEBRIDEMENT BUTTOCK ABSCESS AND DRESSING CHANGE;  Surgeon: Harl Bowie, MD;  Location: [place];  Service: General;  Laterality: N/A;   IRRIGATION AND DEBRIDEMENT ABSCESS Left 02/08/2013   Procedure: IRRIGATION AND DEBRIDEMENT ABSCESS/DRESSING CHANGE;  Surgeon: Gwenyth Ober, MD;  Location: [place];  Service: General;  Laterality: Left;   JOINT REPLACEMENT  2010 / 2012    [place] N/A 07/02/2014   Procedure: LEFT HEART CATHETERIZATION WITH CORONARY ANGIOGRAM;  Surgeon: Lorretta Harp, MD;  Location: [facility] CATH LAB;  Service: Cardiovascular;  Laterality: N/A;   LOWER EXTREMITY ANGIOGRAM Right 04/20/2016   Procedure: Lower Extremity Angiogram;  Surgeon: Elam Dutch, MD;  Location: [place] CV LAB;  Service: Cardiovascular;  Laterality: Right;   MULTIPLE TOOTH EXTRACTIONS     PERIPHERAL VASCULAR CATHETERIZATION N/A 04/20/2016   Procedure: Abdominal Aortogram;  Surgeon: Elam Dutch, MD;  Location: [place] CV LAB;  Service: Cardiovascular;  Laterality: N/A;   PERIPHERAL VASCULAR CATHETERIZATION Right 04/20/2016   Procedure: Peripheral Vascular Intervention;  Surgeon: Elam Dutch, MD;  Location: [place] CV LAB;  Service: Cardiovascular;  Laterality: Right;  pop[place]al   RIGHT HEART CATH N/A 10/27/2018   Procedure: RIGHT HEART CATH;  Surgeon: Larey Dresser, MD;  Location: [place] CV LAB;  Service: Cardiovascular;  Laterality: N/A;   RIGHT HEART CATH N/A 03/20/2019   Procedure: RIGHT HEART CATH;  Surgeon: Larey Dresser, MD;  Location: [place] CV LAB;  Service: Cardiovascular;  Laterality: N/A;   THYROIDECTOMY     TOTAL HIP REVISION Right 08/11/2014   Procedure: RIGHT ACETABULAR REVISION;  Surgeon: Gearlean Alf, MD;  Location:  [facility];  Service: Orthopedics;  Laterality: Right;   VASCULAR SURGERY      Prior to Admission medications   Medication Sig Start Date End Date Taking? Authorizing Provider  allopurinol (ZYLOPRIM) 100 MG tablet Take 1 tablet (100 mg total) by mouth daily. 03/05/21  Yes Medina-Vargas, Monina C, NP  carvedilol (COREG) 12.5 MG tablet Take 1 tablet (12.5 mg total) by mouth 2 (two) times daily. 03/05/21  Yes Medina-Vargas, Monina C, NP  clopidogrel (PLAVIX) 75 MG tablet Take 1 tablet (75 mg total) by mouth daily. 03/05/21  Yes Medina-Vargas, Monina C, NP  diclofenac Sodium (VOLTAREN) 1 % GEL Apply 1 application topically 4 (four) times daily as needed (pain). 08/15/20  Yes [provider]  hydrOXYzine (ATARAX/VISTARIL) 25 MG tablet Take 1 tablet (25 mg total) by mouth 2 (two) times daily as needed for anxiety. 03/05/21  Yes Medina-Vargas, Monina C, NP  insulin lispro protamine-lispro (HUMALOG 75/25 MIX) (75-25) 100 UNIT/ML SUSP injection Inject 30-40 Units into the skin See admin instructions. Inject 40 units into the skin morning and 30 units in the evening  Yes [provider]  levothyroxine (SYNTHROID) 200 MCG tablet Take 1 tablet (200 mcg total) by mouth daily at 6 (six) AM. 03/05/21  Yes Medina-Vargas, Monina C, NP  lubiprostone (AMITIZA) 24 MCG capsule Take 24 mcg by mouth 2 (two) times daily. 06/06/21  Yes [provider]  magnesium oxide (MAG-OX) 400 (241.3 Mg) MG tablet Take 1 tablet (400 mg total) by mouth 2 (two) times daily. 01/14/20  Yes Lavina Hamman, MD  metoCLOPramide (REGLAN) 5 MG tablet Take 1 tablet (5 mg total) by mouth 3 (three) times daily. 03/05/21  Yes Medina-Vargas, Monina C, NP  mirabegron ER (MYRBETRIQ) 50 MG TB24 tablet Take 50 mg by mouth daily.   Yes [provider]  Multiple Vitamin (MULTIVITAMIN WITH MINERALS) TABS tablet Take 1 tablet by mouth daily.   Yes [provider]  oxyCODONE-acetaminophen (PERCOCET) 10-325 MG tablet Take 1  tablet by mouth 4 (four) times daily as needed for pain. 06/10/21  Yes [provider]  pantoprazole (PROTONIX) 40 MG tablet Take 1 tablet (40 mg total) by mouth 2 (two) times daily before a meal for 60 days, THEN 1 tablet (40 mg total) daily. Patient taking differently: Take one tablet (40 mg) by mouth daily 03/05/21 07/03/21 Yes Medina-Vargas, Monina C, NP  polyvinyl alcohol (LIQUIFILM TEARS) 1.4 % ophthalmic solution Place 2 drops into both eyes daily as needed for dry eyes.   Yes [provider]  Potassium Chloride ER 20 MEQ TBCR Take 1 tablet by mouth 3 (three) times a week. Tuesday, Thursdays and Saturdays 03/06/21  Yes Medina-Vargas, Monina C, NP  torsemide (DEMADEX) 100 MG tablet Take 1 tablet (100 mg total) by mouth 2 (two) times daily. 03/05/21  Yes Medina-Vargas, Monina C, NP  TRULICITY 1.5 ST/4.1DQ SOPN Inject 1.5 mg into the skin once a week. Monday Patient taking differently: Inject 1.5 mg into the skin every Monday. 03/05/21  Yes Medina-Vargas, Monina C, NP  cinacalcet (SENSIPAR) 30 MG tablet Take 1 tablet (30 mg total) by mouth daily. Patient not taking: No sig reported 03/05/21   Medina-Vargas, Monina C, NP  metolazone (ZAROXOLYN) 5 MG tablet Take 1 tablet (5 mg total) by mouth 3 (three) times a week. Takes 5mg  on Monday, Wednesday and Friday Patient not taking: No sig reported 03/06/21   Medina-Vargas, Monina C, NP  Polyethylene Glycol 3350 POWD 17 g by Does not apply route daily. Mix with 6-8 oz of fluid choice Patient not taking: No sig reported    [provider]    Scheduled Meds:  allopurinol  100 mg Oral Daily   carvedilol  12.5 mg Oral BID   cinacalcet  30 mg Oral Daily   clopidogrel  75 mg Oral Daily   furosemide  80 mg Intravenous Q12H   guaiFENesin  600 mg Oral BID   heparin  5,000 Units Subcutaneous Q12H   insulin aspart  0-9 Units Subcutaneous TID WC   ipratropium-albuterol  3 mL Nebulization Q6H   levothyroxine  200 mcg Oral Q0600    lubiprostone  24 mcg Oral BID   magnesium oxide  800 mg Oral Q4H   metoCLOPramide  5 mg Oral TID   multivitamin with minerals  1 tablet Oral Daily   pantoprazole  40 mg Oral Daily   polyethylene glycol  17 g Oral Daily   predniSONE  40 mg Oral Q breakfast   Ensure Max Protein  11 oz Oral QHS   sodium chloride flush  3 mL Intravenous Q12H  Continuous Infusions:   ceFAZolin (ANCEF) IV 2 g (07/06/21 2204)   ferric gluconate (FERRLECIT) IVPB     PRN Meds:.acetaminophen **OR** acetaminophen, calcium carbonate (dosed in mg elemental calcium), camphor-menthol **AND** hydrOXYzine, docusate sodium, feeding supplement (NEPRO CARB STEADY), guaiFENesin-dextromethorphan, ipratropium-albuterol, ondansetron **OR** ondansetron (ZOFRAN) IV, oxyCODONE, polyvinyl alcohol, senna-docusate, sorbitol, traZODone, zolpidem  Allergies as of 07/01/2021 - Review Complete 07/01/2021  Allergen Reaction Noted   Nebivolol Swelling 04/17/2015   Ace inhibitors Swelling and Other (See Comments) 11/21/2010   Morphine and related Itching 11/21/2010    Family History  Problem Relation Age of Onset   Diabetes Brother    Cardiomyopathy Mother    Heart disease Mother    Cancer Father     Social History   Socioeconomic History   Marital status: Legally Separated    Spouse name: Not on file   Number of children: 1   Years of education: college   Highest education level: Not on file  Occupational History    Comment: Disabled  Tobacco Use   Smoking status: Some Days    Packs/day: 0.25    Years: 40.00    Pack years: 10.00    Types: Cigarettes   Smokeless tobacco: Never  Vaping Use   Vaping Use: Never used  Substance and Sexual Activity   Alcohol use: Yes    Alcohol/week: 1.0 standard drink    Types: 1 Standard drinks or equivalent per week    Comment: OCC   Drug use: No   Sexual activity: Not Currently  Other Topics Concern   Not on file  Social History Narrative   ** Merged History Encounter **        Patient is separated  and lives at home with her friend. Patient is disabled. Education one year of college Right handed Caffeine two cups daily.     Social Determinants of Health   Financial Resource Strain: Not on file  Food Insecurity: Not on file  Transportation Needs: Not on file  Physical Activity: Not on file  Stress: Not on file  Social Connections: Not on file  Intimate Partner Violence: Not on file    Review of Systems: Review of Systems  Constitutional:  Positive for malaise/fatigue. Negative for weight loss.  HENT:  Negative for hearing loss and tinnitus.   Eyes:  Negative for pain and redness.  Respiratory:  Negative for cough and shortness of breath.   Cardiovascular:  Positive for leg swelling. Negative for chest pain.  Gastrointestinal:  Positive for heartburn. Negative for abdominal pain, blood in stool, constipation, diarrhea, melena, nausea and vomiting.  Genitourinary:  Negative for flank pain and hematuria.  Musculoskeletal:  Negative for falls and myalgias.  Skin:  Negative for itching and rash.  Neurological:  Negative for seizures and loss of consciousness.  Psychiatric/Behavioral:  Negative for substance abuse. The patient is not nervous/anxious.    Physical Exam: Vital signs: Vitals:   07/07/21 0254 07/07/21 0718  BP:  (!) 158/84  Pulse:  91  Resp:  18  Temp:  97.8 F (36.6 C)  SpO2: 97% 95%   Last BM Date: 07/04/21  Physical Exam Vitals reviewed.  Constitutional:      General: She is sleeping. She is not in acute distress. HENT:     Head: Normocephalic and atraumatic.     Nose: No congestion.  Eyes:     Extraocular Movements: Extraocular movements intact.     Comments: Conjunctival pallor  Cardiovascular:     Rate and Rhythm:  Normal rate and regular rhythm.  Pulmonary:     Effort: No respiratory distress.     Comments: Coarse lung sounds bilaterally Abdominal:     General: Bowel sounds are normal. There is no distension.      Palpations: Abdomen is soft. There is no mass.     Tenderness: There is no abdominal tenderness. There is no guarding or rebound.     Hernia: No hernia is present.  Musculoskeletal:     Cervical back: Normal range of motion and neck supple.     Right lower leg: Edema present.     Left lower leg: Edema present.  Skin:    General: Skin is warm and dry.  Neurological:     General: No focal deficit present.     Mental Status: She is oriented to person, place, and time. She is lethargic.  Psychiatric:        Mood and Affect: Mood normal.        Behavior: Behavior normal. Behavior is cooperative.     GI:  Lab Results: Recent Labs    07/05/21 0337 07/06/21 0110 07/07/21 0303  WBC 10.9* 9.3 12.3*  HGB 8.1* 7.7* 8.4*  HCT 27.1* 26.1* 28.1*  PLT 263 278 302   BMET Recent Labs    07/05/21 0337 07/06/21 0110 07/07/21 0303  NA 138 136 137  K 4.1 4.4 4.2  CL 99 99 99  CO2 30 27 28   GLUCOSE 146* 292* 234*  BUN 126* 119* 122*  CREATININE 2.20* 2.36* 2.67*  CALCIUM 8.9 8.8* 9.1   LFT No results for input(s): PROT, ALBUMIN, AST, ALT, ALKPHOS, BILITOT, BILIDIR, IBILI in the last 72 hours. PT/INR No results for input(s): LABPROT, INR in the last 72 hours.   Studies/Results: No results found.  Impression: Iron deficiency anemia, heme-positive stool; history of erosive gastritis (01/2021) -Hgb 8.4, stable.  Had 2u pRBCs 9/25 -Serum iron low (22) with low iron saturation (7%) -Normal ferritin  (46)  Lower extremity cellulitis with resultant sepsis  CKD stage 4  CHF (EF 45-50% as of 02/2021 echo)  PAD, on Plavix, last dose 9/29  Plan: Capsule endoscopy today.  Pending findings, consider EGD and/or colonoscopy.  Start Protonix 40 mg IV BID.  Hold Plavix.  Continue to monitor H&H daily with transfusion as needed to maintain Hgb >7.  [facility] will follow.   LOS: 5 days   Salley Slaughter  PA-C 07/07/2021, 8:36 AM  Contact #  [phone]

## 2021-07-07 NOTE — TOC Progression Note (Signed)
Transition of Care [facility]) - Initial/Assessment Note    Patient Details  Name: [name] [name] MRN: [id number] Date of Birth: 28-Nov-1950  [facility] CM/SW Contact:    Milinda Antis, [place] Phone Number: 07/07/2021, 12:10 PM  Clinical Narrative:                 CSW spoke with Kitty with admissions at [facility].  The facility can accept the patient on Monday.  CSW requested that [facility] CMA's begin insurance auth.  Expected Discharge Plan: Skilled Nursing Facility Barriers to Discharge: Insurance Authorization, SNF Pending bed offer   Patient Goals and CMS Choice Patient states their goals for this hospitalization and ongoing recovery are:: To return home CMS Medicare.gov Compare Post Acute Care list provided to:: Patient Choice offered to / list presented to : Patient  Expected Discharge Plan and Services Expected Discharge Plan: [place]       Living arrangements for the past 2 months: Apartment                                      Prior Living Arrangements/Services Living arrangements for the past 2 months: Apartment Lives with:: Adult Children Patient language and need for interpreter reviewed:: Yes Do you feel safe going back to the place where you live?: Yes      Need for Family Participation in Patient Care: Yes (Comment) Care giver support system in place?: No (comment)   Criminal Activity/Legal Involvement Pertinent to Current Situation/Hospitalization: No - Comment as needed  Activities of Daily Living Home Assistive Devices/Equipment: CBG Meter, Blood pressure cuff, Cane (specify quad or straight), Eyeglasses, Grab bars in shower ADL Screening (condition at time of admission) Patient's cognitive ability adequate to safely complete daily activities?: Yes Is the patient deaf or have difficulty hearing?: No Does the patient have difficulty seeing, even when wearing glasses/contacts?: No Does the patient have difficulty  concentrating, remembering, or making decisions?: No Patient able to express need for assistance with ADLs?: Yes Does the patient have difficulty dressing or bathing?: No Independently performs ADLs?: No Communication: Independent Dressing (OT): Needs assistance Is this a change from baseline?: Change from baseline, expected to last <3days Grooming: Independent Feeding: Independent Bathing: Needs assistance Is this a change from baseline?: Change from baseline, expected to last <3 days Toileting: Needs assistance Is this a change from baseline?: Change from baseline, expected to last <3 days In/Out Bed: Needs assistance Is this a change from baseline?: Change from baseline, expected to last <3 days Walks in Home: Needs assistance Is this a change from baseline?: Change from baseline, expected to last <3 days Does the patient have difficulty walking or climbing stairs?: Yes Weakness of Legs: Both Weakness of Arms/Hands: Both  Permission Sought/Granted   Permission granted to share information with : Yes, Verbal Permission Granted     Permission granted to share info w AGENCY: SNF        Emotional Assessment Appearance:: Appears stated age Attitude/Demeanor/Rapport: Engaged Affect (typically observed): Accepting, Pleasant Orientation: : Oriented to Self, Oriented to Place, Oriented to  Time, Oriented to Situation Alcohol / Substance Use: Not Applicable Psych Involvement: No (comment)  Admission diagnosis:  Cellulitis of leg, right [L03.115] Cellulitis [L03.90] Patient Active Problem List   Diagnosis Date Noted   Chronic diastolic (congestive) heart failure (HCC) 07/01/2021   Chronic pain syndrome 07/01/2021   COPD (chronic obstructive pulmonary disease) (  [place]) 07/01/2021   Cellulitis of leg, right 07/01/2021   SARS-CoV-2 positive 02/21/2021   Increased weakness when ambulating 02/17/2021   Intractable nausea and vomiting 02/15/2021   Symptomatic anemia 01/24/2021    Anemia, unspecified 01/08/2020   Bacteremia 01/07/2020   UTI (urinary tract infection) 01/06/2020   Anemia 10/26/2019   S/P lumbar fusion 09/18/2019   Chronic CHF (congestive heart failure) ([place]) 03/16/2019   Leukocytosis 03/16/2019   Chest pain 11/27/2018   Pre-syncope 11/27/2018   Epigastric abdominal pain 11/27/2018   Solitary pulmonary nodule 11/06/2018   Hypertensive heart and renal disease, stage 1-4 or unspecified chronic kidney disease, with heart failure ([place]) 10/20/2018   CHF (congestive heart failure), NYHA class III, acute on chronic, systolic ([place]) 88/28/0034   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, unspecified extremity ([place]) 04/19/2016   Cellulitis of abdominal wall 03/21/2015   Acute on chronic renal failure ([place]) 03/21/2015   DM (diabetes mellitus), type 2 with renal complications ([place]) 91/79/1505   Abdominal wall abscess 03/21/2015   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   Low urine output 08/11/2014   Acute on chronic systolic CHF (congestive heart failure), NYHA class 3 ([place]) 08/11/2014   Asthma, chronic 08/11/2014   Hypothyroidism 08/11/2014   OSA (obstructive sleep apnea) 08/11/2014   Positive cardiac stress test 06/29/2014   Hyperlipidemia 02/12/2014   Left arm weakness 12/23/2013   TIA (transient ischemic attack) 12/23/2013   Left buttock abscess 10/12/2013   Cellulitis and abscess of leg, right 04/16/2013   Acute blood loss anemia 02/14/2013   HTN (hypertension) 02/14/2013   Cellulitis and abscess of buttock 02/09/2013   Class 2 obesity due to excess calories with body mass index (BMI) of 36.0 to 36.9 in adult 02/03/2013   Diarrhea 02/03/2013   Hyponatremia 02/03/2013   Sepsis due to undetermined organism ([place]) 02/02/2013   Diabetes mellitus without complication ([place]) 69/79/4801   CKD (chronic kidney disease), stage IV ([place]) 02/02/2013   Cellulitis 02/02/2013   PAD (peripheral artery disease) ([place]) 04/26/2011    Tobacco abuse 04/26/2011   PCP:  [name], MD Pharmacy:   [facility] [place], [address] AT [address] [address] Phone: [phone] Fax: [phone]  [facility] - [place] - [address] [place] [address] Phone: [phone] Fax: [phone]     Social Determinants of Health (SDOH) Interventions    Readmission Risk Interventions Readmission Risk Prevention Plan 01/14/2020  Transportation Screening Complete  Medication Review (RN Care Manager) Complete  PCP or Specialist appointment within 3-5 days of discharge Complete  HRI or Home Care Consult Complete  SW Recovery Care/Counseling Consult Complete  Palliative Care Screening Not Applicable  Skilled Nursing Facility Complete  Some recent data might be hidden

## 2021-07-07 NOTE — Progress Notes (Signed)
PROGRESS NOTE    DESARE DUDDY  BHA:[id number] DOB: November 07, 1950 DOA: 07/01/2021 PCP: [name], MD   Brief Narrative:  70 year old with past medical history significant for chronic diastolic and systolic heart failure chronic pain, COPD, diabetes, hypertension, hyperlipidemia, hypothyroidism, OSA not on CPAP, obesity, stage IV CKD and PVD who presents complaining of pain in both her legs and feet.  She was also noted to have a fever.  She was admitted with left lower extremity cellulitis.  Hospital course complicated by worsening shortness of breath and acute on chronic heart failure exacerbation and COPD exacerbation.  Due to iron deficiency and blood loss anemia, GI was consulted as well who planned on capsule study.   Assessment & Plan:   Principal Problem:   Sepsis due to undetermined organism [facility]) Active Problems:   PAD (peripheral artery disease) ([place])   Diabetes mellitus without complication (HCC)   Cellulitis   Class 2 obesity due to excess calories with body mass index (BMI) of 36.0 to 36.9 in adult   Hyperlipidemia   Hypothyroidism   Chronic diastolic (congestive) heart failure (HCC)   Chronic pain syndrome   Cellulitis of leg, right  Left LE cellulitis with sepsis; improving -Sepsis physiology is improving.  Improved - Empiric antibiotics-IV Ancef, complete total 7-day course  Anemia of chronic kidney disease: Hx of Erosive gastritis  - Status post 2 PRBC transfusion 9/25.  Hemoglobin drifting downwards, still has some iron deficiency therefore will give IV iron.  [facility] consulted, planning for capsule endoscopy today and depending on the results we will plan on EGD/colonoscopy.  Plavix currently on hold.   Acute congestive heart failure with reduced ejection fraction, 45%, class III - Breathing symptoms have improved.  We will change Lasix to 80 mg IV daily. - Coreg 12.5 mg twice daily   COPD; Mild exacerbation Patient with worsening shortness of breath,  bilateral wheezing, plan to schedule nebulizer scheduled and prn.  Complete 5 days of oral prednisone   CKD stage IV Creatinine baseline 2.5-3. Monitor on IV Lasix.   Chronic pain syndrome, Continue  with home medication   Peripheral vascular disease - Plavix is currently on hold  Diabetes type 2: -Trulicity on hold.  Sliding scale and Accu-Cheks.   Hypothyroidism:  -Continue with levothyroxine  OSA: Not on CPAP  Class 3 obesity: Needs lifestyle changes     Superficial boil in rectal area. Superficial dressing is ok.    DVT prophylaxis: heparin injection 5,000 Units Start: 07/01/21 1400 Code Status: Full Family Communication:  None  Status is: Inpatient  Remains inpatient appropriate because:Inpatient level of care appropriate due to severity of illness.  Ongoing evaluation for GI bleeding  Dispo: The patient is from: Home              Anticipated d/c is to: Home              Patient currently is not medically stable to d/c.   Difficult to place patient No       Nutritional status  Nutrition Problem: Increased nutrient needs Etiology: acute illness  Signs/Symptoms: estimated needs  Interventions: MVI, Premier Protein, Glucerna shake  Body mass index is 33.91 kg/m.           Subjective: Patient still feels quite lousy with exertional shortness of breath.  Her left lower extremity pain and swelling is significantly improved.  Breathing is better as well today.  Review of Systems Otherwise negative except as per HPI, including: General: Denies  fever, chills, night sweats or unintended weight loss. Resp: Denies cough, wheezing, shortness of breath. Cardiac: Denies chest pain, palpitations, orthopnea, paroxysmal nocturnal dyspnea. GI: Denies abdominal pain, nausea, vomiting, diarrhea or constipation GU: Denies dysuria, frequency, hesitancy or incontinence MS: Denies muscle aches, joint pain or swelling Neuro: Denies headache, neurologic deficits  (focal weakness, numbness, tingling), abnormal gait Psych: Denies anxiety, depression, SI/HI/AVH Skin: Denies new rashes or lesions ID: Denies sick contacts, exotic exposures, travel  Examination:  Constitutional: Not in acute distress Respiratory: Clear to auscultation bilaterally Cardiovascular: Normal sinus rhythm, no rubs Abdomen: Nontender nondistended good bowel sounds Musculoskeletal: No edema noted Skin: No rashes seen Neurologic: CN 2-12 grossly intact.  And nonfocal Psychiatric: Normal judgment and insight. Alert and oriented x 3. Normal mood.   Objective: Vitals:   07/07/21 0254 07/07/21 0500 07/07/21 0718 07/07/21 0904  BP:   (!) 158/84   Pulse:   91   Resp:   18   Temp:   97.8 F (36.6 C)   TempSrc:   Oral   SpO2: 97%  95% 96%  Weight:  98.2 kg    Height:        Intake/Output Summary (Last 24 hours) at 07/07/2021 1106 Last data filed at 07/07/2021 0720 Gross per 24 hour  Intake 100 ml  Output 1950 ml  Net -1850 ml   Filed Weights   07/05/21 0500 07/06/21 0500 07/07/21 0500  Weight: 98 kg 99.5 kg 98.2 kg     Data Reviewed:   CBC: Recent Labs  Lab 07/01/21 0650 07/02/21 0423 07/03/21 0246 07/04/21 0137 07/05/21 0337 07/06/21 0110 07/07/21 0303  WBC 35.9*   < > 14.8* 13.7* 10.9* 9.3 12.3*  NEUTROABS 33.5*  --   --   --   --   --   --   HGB 7.0*   < > 8.1* 8.6* 8.1* 7.7* 8.4*  HCT 24.0*   < > 25.9* 27.6* 27.1* 26.1* 28.1*  MCV 79.2*   < > 79.4* 79.8* 81.9 82.3 81.2  PLT 321   < > 258 269 263 278 302   < > = values in this interval not displayed.   Basic Metabolic Panel: Recent Labs  Lab 07/03/21 0246 07/04/21 0137 07/05/21 0337 07/06/21 0110 07/07/21 0303  NA 135 134* 138 136 137  K 3.7 4.3 4.1 4.4 4.2  CL 96* 98 99 99 99  CO2 29 27 30 27 28   GLUCOSE 158* 154* 146* 292* 234*  BUN 141* 136* 126* 119* 122*  CREATININE 2.58* 2.37* 2.20* 2.36* 2.67*  CALCIUM 8.5* 8.6* 8.9 8.8* 9.1  MG  --   --   --  1.6* 1.6*   GFR: Estimated  Creatinine Clearance: 23.6 mL/min (A) (by C-G formula based on SCr of 2.67 mg/dL (H)). Liver Function Tests: Recent Labs  Lab 07/01/21 0401  AST 22  ALT 14  ALKPHOS 70  BILITOT 0.6  PROT 6.6  ALBUMIN 3.0*   No results for input(s): LIPASE, AMYLASE in the last 168 hours. No results for input(s): AMMONIA in the last 168 hours. Coagulation Profile: Recent Labs  Lab 07/01/21 0401  INR 1.0   Cardiac Enzymes: Recent Labs  Lab 07/01/21 1829  CKTOTAL 70   BNP (last 3 results) No results for input(s): PROBNP in the last 8760 hours. HbA1C: No results for input(s): HGBA1C in the last 72 hours. CBG: Recent Labs  Lab 07/06/21 0631 07/06/21 1138 07/06/21 1607 07/06/21 2152 07/07/21 0727  GLUCAP 182* 225* 242* 315*  172*   Lipid Profile: No results for input(s): CHOL, HDL, LDLCALC, TRIG, CHOLHDL, LDLDIRECT in the last 72 hours. Thyroid Function Tests: No results for input(s): TSH, T4TOTAL, FREET4, T3FREE, THYROIDAB in the last 72 hours. Anemia Panel: Recent Labs    07/07/21 0303  FERRITIN 46  TIBC 326  IRON 22*   Sepsis Labs: Recent Labs  Lab 07/01/21 0401 07/01/21 1829  PROCALCITON  --  3.00  LATICACIDVEN 1.5  --     Recent Results (from the past 240 hour(s))  Urine Culture     Status: None   Collection Time: 07/01/21  3:21 AM   Specimen: In/Out Cath Urine  Result Value Ref Range Status   Specimen Description IN/OUT CATH URINE  Final   Special Requests NONE  Final   Culture   Final    NO GROWTH Performed at [facility], [place] [address] [place], [place] [address]    Report Status 07/03/2021 FINAL  Final  Blood culture (routine single)     Status: None   Collection Time: 07/01/21  4:01 AM   Specimen: BLOOD  Result Value Ref Range Status   Specimen Description BLOOD RIGHT ANTECUBITAL  Final   Special Requests   Final    BOTTLES DRAWN AEROBIC AND ANAEROBIC Blood Culture adequate volume   Culture   Final    NO GROWTH 5 DAYS Performed at [facility], [place] [address], [place], [place] [address]    Report Status 07/06/2021 FINAL  Final  SARS CORONAVIRUS 2 (TAT 6-24 HRS) Nasopharyngeal Nasopharyngeal Swab     Status: None   Collection Time: 07/01/21 10:26 AM   Specimen: Nasopharyngeal Swab  Result Value Ref Range Status   SARS Coronavirus 2 NEGATIVE NEGATIVE Final    Comment: (NOTE) SARS-CoV-2 target nucleic acids are NOT DETECTED.  The SARS-CoV-2 RNA is generally detectable in upper and lower respiratory specimens during the acute phase of infection. Negative results do not preclude SARS-CoV-2 infection, do not rule out co-infections with other pathogens, and should not be used as the sole basis for treatment or other patient management decisions. Negative results must be combined with clinical observations, patient history, and epidemiological information. The expected result is Negative.  Fact Sheet for Patients: SugarRoll.be  Fact Sheet for Healthcare Providers: https://www.woods-mathews.com/  This test is not yet approved or cleared by the [place] FDA and  has been authorized for detection and/or diagnosis of SARS-CoV-2 by FDA under an Emergency Use Authorization (EUA). This EUA will remain  in effect (meaning this test can be used) for the duration of the COVID-19 declaration under Se ction 564(b)(1) of the Act, 21 U.S.C. section 360bbb-3(b)(1), unless the authorization is terminated or revoked sooner.  Performed at [facility], [place] [address], [place], [place] [address]          Radiology Studies: No results found.      Scheduled Meds:  allopurinol  100 mg Oral Daily   carvedilol  12.5 mg Oral BID   cinacalcet  30 mg Oral Daily   furosemide  80 mg Intravenous Q12H   guaiFENesin  600 mg Oral BID   heparin  5,000 Units Subcutaneous Q12H   insulin aspart  0-9 Units Subcutaneous TID WC   ipratropium-albuterol  3 mL Nebulization Q6H    levothyroxine  200 mcg Oral Q0600   lubiprostone  24 mcg Oral BID   magnesium oxide  800 mg Oral Q4H   metoCLOPramide  5 mg Oral  TID   multivitamin with minerals  1 tablet Oral Daily   pantoprazole (PROTONIX) IV  40 mg Intravenous Q12H   polyethylene glycol  17 g Oral Daily   predniSONE  40 mg Oral Q breakfast   Ensure Max Protein  11 oz Oral QHS   sodium chloride flush  3 mL Intravenous Q12H   Continuous Infusions:   ceFAZolin (ANCEF) IV 2 g (07/06/21 2204)   ferric gluconate (FERRLECIT) IVPB       LOS: 5 days   Time spent= 35 mins    Shray Hunley Arsenio Loader, MD [facility]  If 7PM-7AM, please contact night-coverage  07/07/2021, 11:06 AM

## 2021-07-07 NOTE — Progress Notes (Signed)
Went to see patient on the floor to deliver a pill camera for a study. Patient understands the instructions.

## 2021-07-08 DIAGNOSIS — A419 Sepsis, unspecified organism: Secondary | ICD-10-CM | POA: Diagnosis not present

## 2021-07-08 LAB — BASIC METABOLIC PANEL
Anion gap: 11 (ref 5–15)
BUN: 117 mg/dL — ABNORMAL HIGH (ref 8–23)
CO2: 26 mmol/L (ref 22–32)
Calcium: 8.9 mg/dL (ref 8.9–10.3)
Chloride: 100 mmol/L (ref 98–111)
Creatinine, Ser: 2.53 mg/dL — ABNORMAL HIGH (ref 0.44–1.00)
GFR, Estimated: 20 mL/min — ABNORMAL LOW (ref >60)
Glucose, Bld: 254 mg/dL — ABNORMAL HIGH (ref 70–99)
Potassium: 4.4 mmol/L (ref 3.5–5.1)
Sodium: 137 mmol/L (ref 135–145)

## 2021-07-08 LAB — CBC
HCT: 27.5 % — ABNORMAL LOW (ref 36.0–46.0)
Hemoglobin: 8 g/dL — ABNORMAL LOW (ref 12.0–15.0)
MCH: 24 pg — ABNORMAL LOW (ref 26.0–34.0)
MCHC: 29.1 g/dL — ABNORMAL LOW (ref 30.0–36.0)
MCV: 82.6 fL (ref 80.0–100.0)
Platelets: 311 10*3/uL (ref 150–400)
RBC: 3.33 MIL/uL — ABNORMAL LOW (ref 3.87–5.11)
RDW: 21.9 % — ABNORMAL HIGH (ref 11.5–15.5)
WBC: 10.6 10*3/uL — ABNORMAL HIGH (ref 4.0–10.5)
nRBC: 0 % (ref 0.0–0.2)

## 2021-07-08 LAB — GLUCOSE, CAPILLARY
Glucose-Capillary: 166 mg/dL — ABNORMAL HIGH (ref 70–99)
Glucose-Capillary: 218 mg/dL — ABNORMAL HIGH (ref 70–99)
Glucose-Capillary: 330 mg/dL — ABNORMAL HIGH (ref 70–99)
Glucose-Capillary: 391 mg/dL — ABNORMAL HIGH (ref 70–99)

## 2021-07-08 LAB — MAGNESIUM: Magnesium: 1.6 mg/dL — ABNORMAL LOW (ref 1.7–2.4)

## 2021-07-08 SURGERY — EGD (ESOPHAGOGASTRODUODENOSCOPY)
Anesthesia: Monitor Anesthesia Care

## 2021-07-08 MED ORDER — MAGNESIUM OXIDE -MG SUPPLEMENT 400 (240 MG) MG PO TABS
800.0000 mg | ORAL_TABLET | Freq: Three times a day (TID) | ORAL | Status: AC
  Administered 2021-07-08 (×3): 800 mg via ORAL
  Filled 2021-07-08 (×3): qty 2

## 2021-07-08 MED ORDER — CEPHALEXIN 500 MG PO CAPS
500.0000 mg | ORAL_CAPSULE | Freq: Three times a day (TID) | ORAL | Status: AC
  Administered 2021-07-08 – 2021-07-09 (×4): 500 mg via ORAL
  Filled 2021-07-08 (×4): qty 1

## 2021-07-08 MED ORDER — TORSEMIDE 100 MG PO TABS
100.0000 mg | ORAL_TABLET | Freq: Two times a day (BID) | ORAL | Status: DC
  Administered 2021-07-08 – 2021-07-10 (×3): 100 mg via ORAL
  Filled 2021-07-08 (×7): qty 1

## 2021-07-08 MED ORDER — INSULIN ASPART 100 UNIT/ML IJ SOLN
3.0000 [IU] | Freq: Three times a day (TID) | INTRAMUSCULAR | Status: DC
  Administered 2021-07-08 – 2021-07-10 (×8): 3 [IU] via SUBCUTANEOUS

## 2021-07-08 MED ORDER — PANTOPRAZOLE SODIUM 40 MG PO TBEC
40.0000 mg | DELAYED_RELEASE_TABLET | Freq: Two times a day (BID) | ORAL | Status: DC
  Administered 2021-07-08 – 2021-07-10 (×5): 40 mg via ORAL
  Filled 2021-07-08 (×5): qty 1

## 2021-07-08 NOTE — Plan of Care (Signed)

## 2021-07-08 NOTE — Progress Notes (Signed)
[place] 12:00 PM  Subjective: Patient seen and hospital computer chart reviewed case discussed with my partner and our PA and she is familiar to me from her previous hospital stay in the spring and her previous endoscopy and work-up was reviewed and she has no GI complaints and no signs of obvious bleeding and is tolerating regular food objective: Vital signs stable afebrile no acute distress patient not examined today BUN and creatinine essentially stable for her hemoglobin stable Assessment: Subacute GI bleeding in patient with renal failure and blood thinners  Plan: Will either read the capsule later today when it is downloaded or tomorrow and get back to patient at that time to decide further work-up and plans and call me sooner if question or problem arise  [facility]  office [phone] After 5PM or if no answer call [phone]

## 2021-07-08 NOTE — Plan of Care (Signed)
  Problem: Pain Managment: Goal: General experience of comfort will improve Outcome: Progressing   Problem: Safety: Goal: Ability to remain free from injury will improve Outcome: Progressing   Problem: Skin Integrity: Goal: Risk for impaired skin integrity will decrease Outcome: Progressing   Problem: Education: Goal: Knowledge of General Education information will improve Description: Including pain rating scale, medication(s)/side effects and non-pharmacologic comfort measures Outcome: Progressing

## 2021-07-08 NOTE — Progress Notes (Signed)
PROGRESS NOTE    [name] [name]  OVZ:[id number] DOB: 21-Sep-1951 DOA: 07/01/2021 PCP: [name], MD   Brief Narrative:  70 year old with past medical history significant for chronic diastolic and systolic heart failure chronic pain, COPD, diabetes, hypertension, hyperlipidemia, hypothyroidism, OSA not on CPAP, obesity, stage IV CKD and PVD who presents complaining of pain in both her legs and feet.  She was also noted to have a fever.  She was admitted with left lower extremity cellulitis.  Hospital course complicated by worsening shortness of breath and acute on chronic heart failure exacerbation and COPD exacerbation.  Due to iron deficiency and blood loss anemia, GI was consulted as well who planned on capsule study.   Assessment & Plan:   Principal Problem:   Sepsis due to undetermined organism [facility]) Active Problems:   PAD (peripheral artery disease) ([place])   Diabetes mellitus without complication (HCC)   Cellulitis   Class 2 obesity due to excess calories with body mass index (BMI) of 36.0 to 36.9 in adult   Hyperlipidemia   Hypothyroidism   Chronic diastolic (congestive) heart failure (HCC)   Chronic pain syndrome   Cellulitis of leg, right  Left LE cellulitis with sepsis; improving -Sepsis physiology is improving.  Improved - Empiric antibiotics-IV Ancef, change to Keflex. Last day tomorrow.   Anemia of chronic kidney disease: Hx of Erosive gastritis  - Status post 2 PRBC transfusion 9/25.  Hemoglobin drifting downwards, still has some iron deficiency therefore will give IV iron.  [facility] consulted, s/p Video capsule study, pending results.  Plavix currently on hold.   Acute congestive heart failure with reduced ejection fraction, 45%, class 2 - Breathing symptoms have improved. Change to PO Torsemide 100mg  po bid - Coreg 12.5 mg twice daily   COPD; Mild exacerbation Improved. schedule nebulizer scheduled and prn.  Complete 5 days of oral prednisone   CKD stage  IV Creatinine baseline 2.5-3. Monitor with diuretics in place.    Chronic pain syndrome, Continue  with home medication   Peripheral vascular disease - Plavix is currently on hold  Diabetes type 2: -Trulicity on hold.  Sliding scale and Accu-Cheks.   Hypothyroidism:  -Continue with levothyroxine  OSA: Not on CPAP  Class 3 obesity: Needs lifestyle changes     Superficial boil in rectal area. Superficial dressing is ok.    DVT prophylaxis: heparin injection 5,000 Units Start: 07/01/21 1400 Code Status: Full Family Communication:  None  Status is: Inpatient  Remains inpatient appropriate because:Inpatient level of care appropriate due to severity of illness.  Ongoing evaluation for GI bleeding  Dispo: The patient is from: Home              Anticipated d/c is to: Home              Patient currently is not medically stable to d/c.   Difficult to place patient No       Nutritional status  Nutrition Problem: Increased nutrient needs Etiology: acute illness  Signs/Symptoms: estimated needs  Interventions: MVI, Premier Protein, Glucerna shake  Body mass index is 33.91 kg/m.           Subjective: Feels better today, denies any obvious bleeding. No pain in LLE Reports of some pain around her RUE IV site.   Review of Systems Otherwise negative except as per HPI, including: General = no fevers, chills, dizziness,  fatigue HEENT/EYES = negative for loss of vision, double vision, blurred vision,  sore throa Cardiovascular= negative for  chest pain, palpitation Respiratory/lungs= negative for shortness of breath, cough, wheezing; hemoptysis,  Gastrointestinal= negative for nausea, vomiting, abdominal pain Genitourinary= negative for Dysuria MSK = Negative for arthralgia, myalgias Neurology= Negative for headache, numbness, tingling  Psychiatry= Negative for suicidal and homocidal ideation Skin= Negative for Rash   Examination: Constitutional: Not in  acute distress Respiratory: minimal bibasilar rhonchi Cardiovascular: Normal sinus rhythm, no rubs Abdomen: Nontender nondistended good bowel sounds Musculoskeletal: No edema noted Skin: No rashes seen Neurologic: CN 2-12 grossly intact.  And nonfocal Psychiatric: Normal judgment and insight. Alert and oriented x 3. Normal mood.     Objective: Vitals:   07/07/21 2013 07/07/21 2313 07/08/21 0425 07/08/21 0700  BP: (!) 157/78   (!) 155/70  Pulse: 91   89  Resp: 18   18  Temp: 98.3 F (36.8 C)   97.7 F (36.5 C)  TempSrc: Oral   Oral  SpO2: 100% 96% 97% 99%  Weight:      Height:        Intake/Output Summary (Last 24 hours) at 07/08/2021 0924 Last data filed at 07/07/2021 1820 Gross per 24 hour  Intake 630.62 ml  Output 1450 ml  Net -819.38 ml   Filed Weights   07/05/21 0500 07/06/21 0500 07/07/21 0500  Weight: 98 kg 99.5 kg 98.2 kg     Data Reviewed:   CBC: Recent Labs  Lab 07/04/21 0137 07/05/21 0337 07/06/21 0110 07/07/21 0303 07/08/21 0229  WBC 13.7* 10.9* 9.3 12.3* 10.6*  HGB 8.6* 8.1* 7.7* 8.4* 8.0*  HCT 27.6* 27.1* 26.1* 28.1* 27.5*  MCV 79.8* 81.9 82.3 81.2 82.6  PLT 269 263 278 302 749   Basic Metabolic Panel: Recent Labs  Lab 07/04/21 0137 07/05/21 0337 07/06/21 0110 07/07/21 0303 07/08/21 0229  NA 134* 138 136 137 137  K 4.3 4.1 4.4 4.2 4.4  CL 98 99 99 99 100  CO2 27 30 27 28 26   GLUCOSE 154* 146* 292* 234* 254*  BUN 136* 126* 119* 122* 117*  CREATININE 2.37* 2.20* 2.36* 2.67* 2.53*  CALCIUM 8.6* 8.9 8.8* 9.1 8.9  MG  --   --  1.6* 1.6* 1.6*   GFR: Estimated Creatinine Clearance: 24.9 mL/min (A) (by C-G formula based on SCr of 2.53 mg/dL (H)). Liver Function Tests: No results for input(s): AST, ALT, ALKPHOS, BILITOT, PROT, ALBUMIN in the last 168 hours.  No results for input(s): LIPASE, AMYLASE in the last 168 hours. No results for input(s): AMMONIA in the last 168 hours. Coagulation Profile: No results for input(s): INR, PROTIME  in the last 168 hours.  Cardiac Enzymes: Recent Labs  Lab 07/01/21 1829  CKTOTAL 70   BNP (last 3 results) No results for input(s): PROBNP in the last 8760 hours. HbA1C: No results for input(s): HGBA1C in the last 72 hours. CBG: Recent Labs  Lab 07/07/21 0727 07/07/21 1217 07/07/21 1637 07/07/21 2245 07/08/21 0827  GLUCAP 172* 156* 327* 323* 166*   Lipid Profile: No results for input(s): CHOL, HDL, LDLCALC, TRIG, CHOLHDL, LDLDIRECT in the last 72 hours. Thyroid Function Tests: No results for input(s): TSH, T4TOTAL, FREET4, T3FREE, THYROIDAB in the last 72 hours. Anemia Panel: Recent Labs    07/07/21 0303  FERRITIN 46  TIBC 326  IRON 22*   Sepsis Labs: Recent Labs  Lab 07/01/21 1829  PROCALCITON 3.00    Recent Results (from the past 240 hour(s))  Urine Culture     Status: None   Collection Time: 07/01/21  3:21 AM  Specimen: In/Out Cath Urine  Result Value Ref Range Status   Specimen Description IN/OUT CATH URINE  Final   Special Requests NONE  Final   Culture   Final    NO GROWTH Performed at [facility], [address], [place], [place] [address]    Report Status 07/03/2021 FINAL  Final  Blood culture (routine single)     Status: None   Collection Time: 07/01/21  4:01 AM   Specimen: BLOOD  Result Value Ref Range Status   Specimen Description BLOOD RIGHT ANTECUBITAL  Final   Special Requests   Final    BOTTLES DRAWN AEROBIC AND ANAEROBIC Blood Culture adequate volume   Culture   Final    NO GROWTH 5 DAYS Performed at [facility], [place] [address], [place], [place] [address]    Report Status 07/06/2021 FINAL  Final  SARS CORONAVIRUS 2 (TAT 6-24 HRS) Nasopharyngeal Nasopharyngeal Swab     Status: None   Collection Time: 07/01/21 10:26 AM   Specimen: Nasopharyngeal Swab  Result Value Ref Range Status   SARS Coronavirus 2 NEGATIVE NEGATIVE Final    Comment: (NOTE) SARS-CoV-2 target nucleic acids are NOT DETECTED.  The SARS-CoV-2 RNA is  generally detectable in upper and lower respiratory specimens during the acute phase of infection. Negative results do not preclude SARS-CoV-2 infection, do not rule out co-infections with other pathogens, and should not be used as the sole basis for treatment or other patient management decisions. Negative results must be combined with clinical observations, patient history, and epidemiological information. The expected result is Negative.  Fact Sheet for Patients: SugarRoll.be  Fact Sheet for Healthcare Providers: https://www.woods-mathews.com/  This test is not yet approved or cleared by the [place] FDA and  has been authorized for detection and/or diagnosis of SARS-CoV-2 by FDA under an Emergency Use Authorization (EUA). This EUA will remain  in effect (meaning this test can be used) for the duration of the COVID-19 declaration under Se ction 564(b)(1) of the Act, 21 U.S.C. section 360bbb-3(b)(1), unless the authorization is terminated or revoked sooner.  Performed at [facility], [place] [address], [place], [place] [address]          Radiology Studies: No results found.      Scheduled Meds:  allopurinol  100 mg Oral Daily   carvedilol  12.5 mg Oral BID   cinacalcet  30 mg Oral Daily   furosemide  80 mg Intravenous Daily   guaiFENesin  600 mg Oral BID   heparin  5,000 Units Subcutaneous Q12H   insulin aspart  0-9 Units Subcutaneous TID AC & HS   insulin aspart  3 Units Subcutaneous TID WC   ipratropium-albuterol  3 mL Nebulization Q6H   levothyroxine  200 mcg Oral Q0600   lubiprostone  24 mcg Oral BID   magnesium oxide  800 mg Oral TID   metoCLOPramide  5 mg Oral TID   multivitamin with minerals  1 tablet Oral Daily   pantoprazole (PROTONIX) IV  40 mg Intravenous Q12H   polyethylene glycol  17 g Oral Daily   predniSONE  40 mg Oral Q breakfast   Ensure Max Protein  11 oz Oral QHS   sodium chloride flush  3  mL Intravenous Q12H   Continuous Infusions:   ceFAZolin (ANCEF) IV 2 g (07/08/21 0908)   ferric gluconate (FERRLECIT) IVPB 250 mg (07/07/21 1244)     LOS: 6 days   Time spent= 35 mins  Asante Ritacco Arsenio Loader, MD [facility]  If 7PM-7AM, please contact night-coverage  07/08/2021, 9:24 AM

## 2021-07-08 NOTE — Progress Notes (Signed)
PROCEDURE NOTE  07/01/2021 - 07/07/2021  3:29 PM  PATIENT:  [name] [name]  70 y.o. female  PRE-OPERATIVE DIAGNOSIS: Subacute GI bleeding in patient with chronic renal failure on blood thinners  POST-OPERATIVE DIAGNOSIS: Proximal small bowel AVMs  PROCEDURE: Capsule endoscopy  SURGEON:  Surgeon(s): Clarene Essex, MD  ASSESSMENT/FINDINGS: 1 proximal AVM was seen with a few questionable flecks of blood and probable 1 oozing site with no blood seen in the mid and distal small bowel and none in the proximal colon  PLAN OF CARE: We will plan enteroscope by Dr. Therisa Doyne I believe on Monday

## 2021-07-09 DIAGNOSIS — A419 Sepsis, unspecified organism: Secondary | ICD-10-CM | POA: Diagnosis not present

## 2021-07-09 LAB — CBC
HCT: 26.6 % — ABNORMAL LOW (ref 36.0–46.0)
Hemoglobin: 7.9 g/dL — ABNORMAL LOW (ref 12.0–15.0)
MCH: 24 pg — ABNORMAL LOW (ref 26.0–34.0)
MCHC: 29.7 g/dL — ABNORMAL LOW (ref 30.0–36.0)
MCV: 80.9 fL (ref 80.0–100.0)
Platelets: 306 10*3/uL (ref 150–400)
RBC: 3.29 MIL/uL — ABNORMAL LOW (ref 3.87–5.11)
RDW: 22.1 % — ABNORMAL HIGH (ref 11.5–15.5)
WBC: 13.6 10*3/uL — ABNORMAL HIGH (ref 4.0–10.5)
nRBC: 0.1 % (ref 0.0–0.2)

## 2021-07-09 LAB — GLUCOSE, CAPILLARY
Glucose-Capillary: 238 mg/dL — ABNORMAL HIGH (ref 70–99)
Glucose-Capillary: 271 mg/dL — ABNORMAL HIGH (ref 70–99)
Glucose-Capillary: 364 mg/dL — ABNORMAL HIGH (ref 70–99)
Glucose-Capillary: 386 mg/dL — ABNORMAL HIGH (ref 70–99)

## 2021-07-09 LAB — MAGNESIUM: Magnesium: 1.6 mg/dL — ABNORMAL LOW (ref 1.7–2.4)

## 2021-07-09 LAB — BASIC METABOLIC PANEL
Anion gap: 11 (ref 5–15)
BUN: 125 mg/dL — ABNORMAL HIGH (ref 8–23)
CO2: 27 mmol/L (ref 22–32)
Calcium: 8.9 mg/dL (ref 8.9–10.3)
Chloride: 100 mmol/L (ref 98–111)
Creatinine, Ser: 2.49 mg/dL — ABNORMAL HIGH (ref 0.44–1.00)
GFR, Estimated: 20 mL/min — ABNORMAL LOW (ref >60)
Glucose, Bld: 284 mg/dL — ABNORMAL HIGH (ref 70–99)
Potassium: 4.7 mmol/L (ref 3.5–5.1)
Sodium: 138 mmol/L (ref 135–145)

## 2021-07-09 MED ORDER — MAGNESIUM OXIDE -MG SUPPLEMENT 400 (240 MG) MG PO TABS
800.0000 mg | ORAL_TABLET | Freq: Three times a day (TID) | ORAL | Status: AC
  Administered 2021-07-09 (×3): 800 mg via ORAL
  Filled 2021-07-09 (×3): qty 2

## 2021-07-09 NOTE — Progress Notes (Signed)
PROGRESS NOTE    [name] [name]  KZS:[id number] DOB: [birth year] DOA: 07/01/2021 PCP: [name], MD   Brief Narrative:  70 year old with past medical history significant for chronic diastolic and systolic heart failure chronic pain, COPD, diabetes, hypertension, hyperlipidemia, hypothyroidism, OSA not on CPAP, obesity, stage IV CKD and PVD who presents complaining of pain in both her legs and feet.  She was also noted to have a fever.  She was admitted with left lower extremity cellulitis.  Hospital course complicated by worsening shortness of breath and acute on chronic heart failure exacerbation and COPD exacerbation.  Due to iron deficiency and blood loss anemia, GI was consulted as well who planned on capsule study.  Capsule study showed 1 proximal AVM with few questionable flecks of blood and 1 probable oozing site but no blood in the mid to distal colon.  Plans for possible enteroscopy.   Assessment & Plan:   Principal Problem:   Sepsis due to undetermined organism [facility]) Active Problems:   PAD (peripheral artery disease) ([employer])   Diabetes mellitus without complication (HCC)   Cellulitis   Class 2 obesity due to excess calories with body mass index (BMI) of 36.0 to 36.9 in adult   Hyperlipidemia   Hypothyroidism   Chronic diastolic (congestive) heart failure (HCC)   Chronic pain syndrome   Cellulitis of leg, right  Left LE cellulitis with sepsis; improving -Sepsis physiology is improving.  Improved - Empiric antibiotics-IV Ancef, change to Keflex. Last day tomorrow.   Anemia of chronic kidney disease: Hx of Erosive gastritis  - Status post 2 PRBC transfusion 9/25.  Hemoglobin drifting downwards, still has some iron deficiency therefore will give IV iron.  [facility] consulted, s/p Video capsule study-which showed AVM with possible fleck of blood in the proximal colon.  Plans for enteroscopy possibly on Monday.  We will place her on clears and n.p.o. past midnight.   Acute  congestive heart failure with reduced ejection fraction, 45%, class 2 - Breathing symptoms have improved. Change to PO Torsemide 100mg  po bid - Coreg 12.5 mg twice daily   COPD; Mild exacerbation Improved. schedule nebulizer scheduled and prn.  Last day of oral prednisone today   CKD stage IV Creatinine baseline 2.5-3. Monitor with diuretics in place.    Chronic pain syndrome, Continue  with home medication   Peripheral vascular disease - Plavix is currently on hold  Diabetes type 2: Uncontrolled due to hyperglycemia -Trulicity on hold.  Sliding scale and Accu-Cheks.  Hopefully now will improve with discontinuation of prednisone   Hypothyroidism:  -Continue with levothyroxine  OSA: Not on CPAP  Class 3 obesity: Needs lifestyle changes     Superficial boil in rectal area. Superficial dressing is ok.    DVT prophylaxis: heparin injection 5,000 Units Start: 07/01/21 1400 Code Status: Full Family Communication:  None  Status is: Inpatient  Remains inpatient appropriate because:Inpatient level of care appropriate due to severity of illness.  Ongoing evaluation for GI bleeding  Dispo: The patient is from: Home              Anticipated d/c is to: Home              Patient currently is not medically stable to d/c.   Difficult to place patient No       Nutritional status  Nutrition Problem: Increased nutrient needs Etiology: acute illness  Signs/Symptoms: estimated needs  Interventions: MVI, Premier Protein, Glucerna shake  Body mass index is 34.32 kg/m.  Subjective: Feels okay, no acute events overnight.  Breathing is better.  Review of Systems Otherwise negative except as per HPI, including: General: Denies fever, chills, night sweats or unintended weight loss. Resp: Denies cough, wheezing, shortness of breath. Cardiac: Denies chest pain, palpitations, orthopnea, paroxysmal nocturnal dyspnea. GI: Denies abdominal pain, nausea, vomiting, diarrhea  or constipation GU: Denies dysuria, frequency, hesitancy or incontinence MS: Denies muscle aches, joint pain or swelling Neuro: Denies headache, neurologic deficits (focal weakness, numbness, tingling), abnormal gait Psych: Denies anxiety, depression, SI/HI/AVH Skin: Denies new rashes or lesions ID: Denies sick contacts, exotic exposures, travel   Examination: Constitutional: Not in acute distress Respiratory: Clear to auscultation bilaterally Cardiovascular: Normal sinus rhythm, no rubs Abdomen: Nontender nondistended good bowel sounds Musculoskeletal: No edema noted Skin: No rashes seen Neurologic: CN 2-12 grossly intact.  And nonfocal Psychiatric: Normal judgment and insight. Alert and oriented x 3. Normal mood.  Objective: Vitals:   07/08/21 0700 07/08/21 1500 07/08/21 2245 07/09/21 0500  BP: (!) 155/70 134/87 (!) 158/81   Pulse: 89 89 92   Resp: 18 18 18    Temp: 97.7 F (36.5 C) 98.7 F (37.1 C) 98.6 F (37 C)   TempSrc: Oral Oral Oral   SpO2: 99% 96% 100%   Weight:    99.4 kg  Height:        Intake/Output Summary (Last 24 hours) at 07/09/2021 0939 Last data filed at 07/08/2021 1700 Gross per 24 hour  Intake 480 ml  Output 300 ml  Net 180 ml   Filed Weights   07/06/21 0500 07/07/21 0500 07/09/21 0500  Weight: 99.5 kg 98.2 kg 99.4 kg     Data Reviewed:   CBC: Recent Labs  Lab 07/05/21 0337 07/06/21 0110 07/07/21 0303 07/08/21 0229 07/09/21 0337  WBC 10.9* 9.3 12.3* 10.6* 13.6*  HGB 8.1* 7.7* 8.4* 8.0* 7.9*  HCT 27.1* 26.1* 28.1* 27.5* 26.6*  MCV 81.9 82.3 81.2 82.6 80.9  PLT 263 278 302 311 099   Basic Metabolic Panel: Recent Labs  Lab 07/05/21 0337 07/06/21 0110 07/07/21 0303 07/08/21 0229 07/09/21 0547  NA 138 136 137 137 138  K 4.1 4.4 4.2 4.4 4.7  CL 99 99 99 100 100  CO2 30 27 28 26 27   GLUCOSE 146* 292* 234* 254* 284*  BUN 126* 119* 122* 117* 125*  CREATININE 2.20* 2.36* 2.67* 2.53* 2.49*  CALCIUM 8.9 8.8* 9.1 8.9 8.9  MG  --   1.6* 1.6* 1.6* 1.6*   GFR: Estimated Creatinine Clearance: 25.5 mL/min (A) (by C-G formula based on SCr of 2.49 mg/dL (H)). Liver Function Tests: No results for input(s): AST, ALT, ALKPHOS, BILITOT, PROT, ALBUMIN in the last 168 hours.  No results for input(s): LIPASE, AMYLASE in the last 168 hours. No results for input(s): AMMONIA in the last 168 hours. Coagulation Profile: No results for input(s): INR, PROTIME in the last 168 hours.  Cardiac Enzymes: No results for input(s): CKTOTAL, CKMB, CKMBINDEX, TROPONINI in the last 168 hours.  BNP (last 3 results) No results for input(s): PROBNP in the last 8760 hours. HbA1C: No results for input(s): HGBA1C in the last 72 hours. CBG: Recent Labs  Lab 07/08/21 0827 07/08/21 1211 07/08/21 1643 07/08/21 2232 07/09/21 0732  GLUCAP 166* 218* 330* 391* 238*   Lipid Profile: No results for input(s): CHOL, HDL, LDLCALC, TRIG, CHOLHDL, LDLDIRECT in the last 72 hours. Thyroid Function Tests: No results for input(s): TSH, T4TOTAL, FREET4, T3FREE, THYROIDAB in the last 72 hours. Anemia Panel: Recent Labs  07/07/21 0303  FERRITIN 46  TIBC 326  IRON 22*   Sepsis Labs: No results for input(s): PROCALCITON, LATICACIDVEN in the last 168 hours.   Recent Results (from the past 240 hour(s))  Urine Culture     Status: None   Collection Time: 07/01/21  3:21 AM   Specimen: In/Out Cath Urine  Result Value Ref Range Status   Specimen Description IN/OUT CATH URINE  Final   Special Requests NONE  Final   Culture   Final    NO GROWTH Performed at [facility], [address], [place], [place] [address]    Report Status 07/03/2021 FINAL  Final  Blood culture (routine single)     Status: None   Collection Time: 07/01/21  4:01 AM   Specimen: BLOOD  Result Value Ref Range Status   Specimen Description BLOOD RIGHT ANTECUBITAL  Final   Special Requests   Final    BOTTLES DRAWN AEROBIC AND ANAEROBIC Blood Culture adequate volume    Culture   Final    NO GROWTH 5 DAYS Performed at [facility], [place] [address], [place], [place] [address]    Report Status 07/06/2021 FINAL  Final  SARS CORONAVIRUS 2 (TAT 6-24 HRS) Nasopharyngeal Nasopharyngeal Swab     Status: None   Collection Time: 07/01/21 10:26 AM   Specimen: Nasopharyngeal Swab  Result Value Ref Range Status   SARS Coronavirus 2 NEGATIVE NEGATIVE Final    Comment: (NOTE) SARS-CoV-2 target nucleic acids are NOT DETECTED.  The SARS-CoV-2 RNA is generally detectable in upper and lower respiratory specimens during the acute phase of infection. Negative results do not preclude SARS-CoV-2 infection, do not rule out co-infections with other pathogens, and should not be used as the sole basis for treatment or other patient management decisions. Negative results must be combined with clinical observations, patient history, and epidemiological information. The expected result is Negative.  Fact Sheet for Patients: SugarRoll.be  Fact Sheet for Healthcare Providers: https://www.woods-mathews.com/  This test is not yet approved or cleared by the [place] FDA and  has been authorized for detection and/or diagnosis of SARS-CoV-2 by FDA under an Emergency Use Authorization (EUA). This EUA will remain  in effect (meaning this test can be used) for the duration of the COVID-19 declaration under Se ction 564(b)(1) of the Act, 21 U.S.C. section 360bbb-3(b)(1), unless the authorization is terminated or revoked sooner.  Performed at [facility], [place] [address], [place], [place] [address]          Radiology Studies: No results found.      Scheduled Meds:  allopurinol  100 mg Oral Daily   carvedilol  12.5 mg Oral BID   cephALEXin  500 mg Oral Q8H   cinacalcet  30 mg Oral Daily   guaiFENesin  600 mg Oral BID   heparin  5,000 Units Subcutaneous Q12H   insulin aspart  0-9 Units Subcutaneous TID AC & HS    insulin aspart  3 Units Subcutaneous TID WC   levothyroxine  200 mcg Oral Q0600   lubiprostone  24 mcg Oral BID   magnesium oxide  800 mg Oral TID   metoCLOPramide  5 mg Oral TID   multivitamin with minerals  1 tablet Oral Daily   pantoprazole  40 mg Oral BID   polyethylene glycol  17 g Oral Daily   Ensure Max Protein  11 oz Oral QHS   sodium chloride flush  3 mL Intravenous Q12H  torsemide  100 mg Oral BID   Continuous Infusions:     LOS: 7 days   Time spent= 35 mins    Leonna Schlee Arsenio Loader, MD [facility]  If 7PM-7AM, please contact night-coverage  07/09/2021, 9:39 AM

## 2021-07-09 NOTE — Anesthesia Preprocedure Evaluation (Addendum)
Anesthesia Evaluation  Patient identified by MRN, date of birth, ID band Patient awake    Reviewed: Allergy & Precautions, NPO status , Patient's Chart, lab work & pertinent test results, reviewed documented beta blocker date and time   History of Anesthesia Complications Negative for: history of anesthetic complications  Airway Mallampati: II  TM Distance: >3 FB Neck ROM: Full    Dental no notable dental hx.    Pulmonary asthma , sleep apnea , Current Smoker and Patient abstained from smoking.,    Pulmonary exam normal        Cardiovascular hypertension, Pt. on medications and Pt. on home beta blockers + Peripheral Vascular Disease and +CHF  Normal cardiovascular exam  TTE 02/2021: EF 45-50%, moderate LVH, grade I DD, severe LAE, mild MR    Neuro/Psych TIAnegative psych ROS   GI/Hepatic Neg liver ROS, hiatal hernia, GERD  Controlled and Medicated,GIB   Endo/Other  diabetes, Poorly Controlled, Type 2, Insulin DependentHypothyroidism   Renal/GU Renal InsufficiencyRenal disease (Cr 2.49)  negative genitourinary   Musculoskeletal  (+) Arthritis ,   Abdominal   Peds  Hematology  (+) anemia , Hgb 7.9   Anesthesia Other Findings Day of surgery medications reviewed with patient.  Reproductive/Obstetrics negative OB ROS                            Anesthesia Physical Anesthesia Plan  ASA: 4  Anesthesia Plan: MAC   Post-op Pain Management:    Induction:   PONV Risk Score and Plan: 1 and Treatment may vary due to age or medical condition and Propofol infusion  Airway Management Planned: Natural Airway and Nasal Cannula  Additional Equipment: None  Intra-op Plan:   Post-operative Plan:   Informed Consent: I have reviewed the patients History and Physical, chart, labs and discussed the procedure including the risks, benefits and alternatives for the proposed anesthesia with the patient or  authorized representative who has indicated his/her understanding and acceptance.       Plan Discussed with: CRNA  Anesthesia Plan Comments:        Anesthesia Quick Evaluation

## 2021-07-09 NOTE — Progress Notes (Signed)
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 Therisa Doyne will proceed tomorrow morning with further work-up and plans pending those findings although I would think dialysis and improving her kidney function would probably be helpful  [facility]  office [phone] After 5PM or if no answer call [phone]

## 2021-07-09 NOTE — H&P (View-Only) (Signed)
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 Therisa Doyne will proceed tomorrow morning with further work-up and plans pending those findings although I would think dialysis and improving her kidney function would probably be helpful  [facility] E  office [phone] After 5PM or if no answer call [phone]

## 2021-07-10 ENCOUNTER — Inpatient Hospital Stay (HOSPITAL_COMMUNITY): Payer: 59 | Admitting: Anesthesiology

## 2021-07-10 ENCOUNTER — Encounter (HOSPITAL_COMMUNITY): Payer: Self-pay | Admitting: Gastroenterology

## 2021-07-10 ENCOUNTER — Encounter (HOSPITAL_COMMUNITY)
Admission: EM | Disposition: A | Discharge status: Skilled Nursing Facility | Payer: Self-pay | Source: Home / Self Care | Attending: Internal Medicine

## 2021-07-10 DIAGNOSIS — A419 Sepsis, unspecified organism: Secondary | ICD-10-CM | POA: Diagnosis not present

## 2021-07-10 HISTORY — PX: ENTEROSCOPY: SHX5533

## 2021-07-10 HISTORY — PX: HOT HEMOSTASIS: SHX5433

## 2021-07-10 LAB — BASIC METABOLIC PANEL
Anion gap: 10 (ref 5–15)
BUN: 124 mg/dL — ABNORMAL HIGH (ref 8–23)
CO2: 28 mmol/L (ref 22–32)
Calcium: 9.1 mg/dL (ref 8.9–10.3)
Chloride: 98 mmol/L (ref 98–111)
Creatinine, Ser: 2.41 mg/dL — ABNORMAL HIGH (ref 0.44–1.00)
GFR, Estimated: 21 mL/min — ABNORMAL LOW (ref >60)
Glucose, Bld: 262 mg/dL — ABNORMAL HIGH (ref 70–99)
Potassium: 5.2 mmol/L — ABNORMAL HIGH (ref 3.5–5.1)
Sodium: 136 mmol/L (ref 135–145)

## 2021-07-10 LAB — GLUCOSE, CAPILLARY
Glucose-Capillary: 298 mg/dL — ABNORMAL HIGH (ref 70–99)
Glucose-Capillary: 193 mg/dL — ABNORMAL HIGH (ref 70–99)
Glucose-Capillary: 166 mg/dL — ABNORMAL HIGH (ref 70–99)
Glucose-Capillary: 172 mg/dL — ABNORMAL HIGH (ref 70–99)
Glucose-Capillary: 331 mg/dL — ABNORMAL HIGH (ref 70–99)
Glucose-Capillary: 286 mg/dL — ABNORMAL HIGH (ref 70–99)

## 2021-07-10 LAB — CBC
HCT: 26.9 % — ABNORMAL LOW (ref 36.0–46.0)
Hemoglobin: 8.1 g/dL — ABNORMAL LOW (ref 12.0–15.0)
MCH: 24.6 pg — ABNORMAL LOW (ref 26.0–34.0)
MCHC: 30.1 g/dL (ref 30.0–36.0)
MCV: 81.8 fL (ref 80.0–100.0)
Platelets: 353 10*3/uL (ref 150–400)
RBC: 3.29 MIL/uL — ABNORMAL LOW (ref 3.87–5.11)
RDW: 22.2 % — ABNORMAL HIGH (ref 11.5–15.5)
WBC: 13.9 10*3/uL — ABNORMAL HIGH (ref 4.0–10.5)
nRBC: 0.3 % — ABNORMAL HIGH (ref 0.0–0.2)

## 2021-07-10 LAB — MAGNESIUM: Magnesium: 1.7 mg/dL (ref 1.7–2.4)

## 2021-07-10 LAB — RESP PANEL BY RT-PCR (FLU A&B, COVID) ARPGX2
Influenza A by PCR: NEGATIVE
Influenza B by PCR: NEGATIVE
SARS Coronavirus 2 by RT PCR: NEGATIVE

## 2021-07-10 SURGERY — ENTEROSCOPY
Anesthesia: Monitor Anesthesia Care

## 2021-07-10 MED ORDER — SENNOSIDES-DOCUSATE SODIUM 8.6-50 MG PO TABS: 1.0000 | ORAL_TABLET | Freq: Every evening | ORAL | Status: DC | PRN

## 2021-07-10 MED ORDER — OXYCODONE-ACETAMINOPHEN 10-325 MG PO TABS: 1.0000 | ORAL_TABLET | Freq: Four times a day (QID) | ORAL | 0 refills | Status: DC | PRN

## 2021-07-10 MED ORDER — PROMETHAZINE HCL 25 MG/ML IJ SOLN: 6.2500 mg | INTRAMUSCULAR | Status: DC | PRN

## 2021-07-10 MED ORDER — PROPOFOL 500 MG/50ML IV EMUL
INTRAVENOUS | Status: DC | PRN
  Administered 2021-07-10: 100 ug/kg/min via INTRAVENOUS

## 2021-07-10 MED ORDER — SODIUM CHLORIDE 0.9 % IV SOLN
INTRAVENOUS | Status: DC
  Administered 2021-07-10: 500 mL via INTRAVENOUS

## 2021-07-10 MED ORDER — PANTOPRAZOLE SODIUM 40 MG PO TBEC: 40.0000 mg | DELAYED_RELEASE_TABLET | Freq: Two times a day (BID) | ORAL | Status: DC

## 2021-07-10 MED ORDER — SODIUM ZIRCONIUM CYCLOSILICATE 10 G PO PACK
10.0000 g | PACK | Freq: Once | ORAL | Status: AC
  Administered 2021-07-10: 10 g via ORAL
  Filled 2021-07-10: qty 1

## 2021-07-10 MED ORDER — POLYETHYLENE GLYCOL 3350 17 G PO PACK: 17.0000 g | PACK | Freq: Every day | ORAL | 0 refills | Status: DC

## 2021-07-10 MED ORDER — LIDOCAINE 2% (20 MG/ML) 5 ML SYRINGE
INTRAMUSCULAR | Status: DC | PRN
  Administered 2021-07-10: 40 mg via INTRAVENOUS

## 2021-07-10 MED ORDER — PROPOFOL 10 MG/ML IV BOLUS
INTRAVENOUS | Status: DC | PRN
  Administered 2021-07-10: 70 mg via INTRAVENOUS

## 2021-07-10 NOTE — Op Note (Signed)
[facility] Patient Name: [name] [name] Procedure Date : 07/10/2021 MRN: [id number] Attending MD: Ronnette Juniper , MD Date of Birth: September 27, 1951 CSN: [id number] Age: 70 Admit Type: Inpatient Procedure:                Small bowel enteroscopy Indications:              Abnormal video capsule endoscopy suspicious for                            proximal small bowel bleed Providers:                Ronnette Juniper, MD, Dulcy Fanny, Tyna Jaksch                            Technician Referring MD:             [facility] Medicines:                Monitored Anesthesia Care Complications:            No immediate complications. Estimated Blood Loss:     Estimated blood loss: none. Procedure:                Pre-Anesthesia Assessment:                           - Prior to the procedure, a History and Physical                            was performed, and patient medications and                            allergies were reviewed. The patient's tolerance of                            previous anesthesia was also reviewed. The risks                            and benefits of the procedure and the sedation                            options and risks were discussed with the patient.                            All questions were answered, and informed consent                            was obtained. Prior Anticoagulants: The patient has                            taken Plavix (clopidogrel), last dose was 9 days                            prior to procedure. ASA Grade Assessment: III - A  patient with severe systemic disease. After                            reviewing the risks and benefits, the patient was                            deemed in satisfactory condition to undergo the                            procedure.                           After obtaining informed consent, the endoscope was                            passed under direct vision. Throughout  the                            procedure, the patient's blood pressure, pulse, and                            oxygen saturations were monitored continuously. The                            PCF-H190TL ([id number] Olympus slim colonoscope was                            introduced through the mouth and advanced to the                            jejunum, to the 160 cm mark (from the incisors).                            The small bowel enteroscopy was accomplished                            without difficulty. The patient tolerated the                            procedure well. Scope In: Scope Out: Findings:      The esophagus was normal.      The stomach was normal.      There was no evidence of significant pathology in the entire examined       duodenum.      Three diminutive angioectasias with no bleeding were found at 140 cm       (from the incisors), at 150 cm (from the incisors) and at 160 cm (from       the incisors). Coagulation for tissue destruction using argon plasma at       0.5 liters/minute and 20 watts was successful. Estimated blood loss:       none. Impression:               - Normal esophagus.                           -  Normal stomach.                           - Normal examined duodenum.                           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                            Treated with argon plasma coagulation (APC).                           - No specimens collected. Moderate Sedation:      Patient did not receive moderate sedation for this procedure, but       instead received monitored anesthesia care. Recommendation:           - Renal diet today.                           - Patient likely has chronic blood loss anemia                            related to small bowel AVMs. Ideally, she will                            benefit from a double balloon enteroscopy at a                            tertiary center as there is possibility of having                             more AVMs, distal to 160 cm(extent of push                            enteroscopy).                           - If plavix is needed, ok to resume today. However,                            if there is no strong indication for antiplatelet                            therapy, consider discontinuing antiplatelets as it                            will worsen chronic GI blood loss.                           - Recommend periodic monitoring of H and H and iron                            panel and transfuse PRBC or IV iron as needed.                           -  Recommend PPI BID indefinitely. Procedure Code(s):        --- Professional ---                           [phone], Small intestinal endoscopy, enteroscopy                            beyond second portion of duodenum, not including                            ileum; with ablation of tumor(s), polyp(s), or                            other lesion(s) not amenable to removal by hot                            biopsy forceps, bipolar cautery or snare technique Diagnosis Code(s):        --- Professional ---                           K55.20, Angiodysplasia of colon without hemorrhage                           R93.3, Abnormal findings on diagnostic imaging of                            other parts of digestive tract CPT copyright 2019 American Medical Association. All rights reserved. The codes documented in this report are preliminary and upon coder review may  be revised to meet current compliance requirements. Ronnette Juniper, MD 07/10/2021 9:02:24 AM This report has been signed electronically. Number of Addenda: 0

## 2021-07-10 NOTE — Progress Notes (Signed)
Pt presented to endo today for enteroscopy. Upon assessment of the pt's skin, a warm, elevated bruise was noted on the right lower leg. Rodell Perna, RN on 5N made aware.  Debarah Crape, RN

## 2021-07-10 NOTE — Anesthesia Postprocedure Evaluation (Signed)
Anesthesia Post Note  Patient: [name] [name]  Procedure(s) Performed: ENTEROSCOPY HOT HEMOSTASIS (ARGON PLASMA COAGULATION/BICAP)     Patient location during evaluation: PACU Anesthesia Type: MAC Level of consciousness: awake and alert and oriented Pain management: pain level controlled Vital Signs Assessment: post-procedure vital signs reviewed and stable Respiratory status: spontaneous breathing, nonlabored ventilation and respiratory function stable Cardiovascular status: blood pressure returned to baseline Postop Assessment: no apparent nausea or vomiting Anesthetic complications: no   No notable events documented.  Last Vitals:  Vitals:   07/10/21 0856 07/10/21 0911  BP: 126/66 (!) 142/62  Pulse: 90 90  Resp: 13 18  Temp: 36.5 C 36.5 C  SpO2: 100% 97%    Last Pain:  Vitals:   07/10/21 0911  TempSrc:   PainSc: 0-No pain                 Marthenia Rolling

## 2021-07-10 NOTE — Anesthesia Procedure Notes (Addendum)
Procedure Name: MAC Date/Time: 07/10/2021 8:20 AM Performed by: Leonor Liv, CRNA Pre-anesthesia Checklist: Patient identified, Emergency Drugs available, Suction available, Patient being monitored and Timeout performed Patient Re-evaluated:Patient Re-evaluated prior to induction Oxygen Delivery Method: Simple face mask Preoxygenation: Pre-oxygenation with 100% oxygen Placement Confirmation: positive ETCO2 Dental Injury: Teeth and Oropharynx as per pre-operative assessment

## 2021-07-10 NOTE — Discharge Summary (Signed)
Physician Discharge Summary  [name] [name] HQP:[id number] DOB: 01/27/1951 DOA: 07/01/2021  PCP: [name], MD  Admit date: 07/01/2021 Discharge date: 07/10/2021  Admitted From: Home Disposition:  SNF  Recommendations for Outpatient Follow-up:  Follow up with PCP in 1-2 weeks Please obtain BMP/CBC in one week your next doctors visit.  Should be on PPI twice daily indefinitely per GI Will discontinue Plavix for now due to concerns of recurrent GI bleeding from AVM. Pain medication with bowel regimen prescribed   Discharge Condition: Stable CODE STATUS: Full code Diet recommendation: Heart healthy/diabetic  Brief/Interim Summary: 70 year old with past medical history significant for chronic diastolic and systolic heart failure chronic pain, COPD, diabetes, hypertension, hyperlipidemia, hypothyroidism, OSA not on CPAP, obesity, stage IV CKD and PVD who presents complaining of pain in both her legs and feet.  She was also noted to have a fever.  She was admitted with left lower extremity cellulitis.  Hospital course complicated by worsening shortness of breath and acute on chronic heart failure exacerbation and COPD exacerbation.  Due to iron deficiency and blood loss anemia, GI was consulted as well who planned on capsule study.Capsule study confirmed AVMs.  She also underwent an enteroscopy on 07/10/2021 by GI, no obvious evidence of bleeding was noted but there was evidence of AVMs and suspicion likely she has more in the lower part of the GI tract.  She is at very high risk of recurrent bleeding therefore Plavix discontinued for now.  She will need repeat CBC in about a week, may also require periodic transfusions.  PT recommended SNF therefore arrangements made.     Assessment & Plan:   Principal Problem:   Sepsis due to undetermined organism [facility]) Active Problems:   PAD (peripheral artery disease) ([place])   Diabetes mellitus without complication (HCC)   Cellulitis   Class 2  obesity due to excess calories with body mass index (BMI) of 36.0 to 36.9 in adult   Hyperlipidemia   Hypothyroidism   Chronic diastolic (congestive) heart failure (HCC)   Chronic pain syndrome   Cellulitis of leg, right   Left LE cellulitis with sepsis; improving -Sepsis physiology is improving.  Improved - Empiric antibiotics-IV Ancef, change to Keflex.  Completed 7 days today   Anemia of chronic kidney disease: Hx of Erosive gastritis  - Status post 2 PRBC transfusion 9/25.  Hemoglobin drifting downwards, still has some iron deficiency therefore will give IV iron.  [facility] consulted, status post video capsule endoscopy and enteroscopy 07/10/2021.  Both of which confirmed AVMs but no active bleeding.  She is certainly at high risk of recurrent bleeding therefore Plavix discontinued.  She will require repeat lab work in 1 week.  She may also need periodic outpatient iron or blood transfusions.   Acute congestive heart failure with reduced ejection fraction, 45%, class 2 - Breathing symptoms have improved. Change to PO Torsemide 100mg  po bid - Coreg 12.5 mg twice daily   COPD; Mild exacerbation Completed 5 days of prednisone.  Breathing is improved.  Continue bronchodilators as needed   CKD stage IV Creatinine baseline 2.5-3. Monitor with diuretics in place.    Chronic pain syndrome, Continue  with home medication   Peripheral vascular disease - Plavix is currently on hold   Diabetes type 2: - Resume her home regimen.  On sliding scale and Accu-Cheks while here   Hypothyroidism:  -Continue with levothyroxine   OSA: Not on CPAP   Class 3 obesity: Needs lifestyle changes  Superficial boil in rectal area. Superficial dressing is ok.     Body mass index is 34.32 kg/m.         Discharge Diagnoses:  Principal Problem:   Sepsis due to undetermined organism [facility]) Active Problems:   PAD (peripheral artery disease) ([place])   Diabetes mellitus without complication  (HCC)   Cellulitis   Class 2 obesity due to excess calories with body mass index (BMI) of 36.0 to 36.9 in adult   Hyperlipidemia   Hypothyroidism   Chronic diastolic (congestive) heart failure (HCC)   Chronic pain syndrome   Cellulitis of leg, right      Consultations: Gastroenterology  Subjective: Tolerated her enteroscopy this morning, does not have any complaints at this time.  Discharge Exam: Vitals:   07/10/21 0911 07/10/21 0952  BP: (!) 142/62 (!) 154/62  Pulse: 90 82  Resp: 18 18  Temp: 97.7 F (36.5 C) 98 F (36.7 C)  SpO2: 97% 100%   Vitals:   07/10/21 0741 07/10/21 0856 07/10/21 0911 07/10/21 0952  BP: 136/69 126/66 (!) 142/62 (!) 154/62  Pulse: 85 90 90 82  Resp: 20 13 18 18   Temp: 97.7 F (36.5 C) 97.7 F (36.5 C) 97.7 F (36.5 C) 98 F (36.7 C)  TempSrc: Temporal   Oral  SpO2: 97% 100% 97% 100%  Weight:      Height:        General: Pt is alert, awake, not in acute distress Cardiovascular: RRR, S1/S2 +, no rubs, no gallops Respiratory: CTA bilaterally, no wheezing, no rhonchi Abdominal: Soft, NT, ND, bowel sounds + Extremities: no edema, no cyanosis  Discharge Instructions   Allergies as of 07/10/2021       Reactions   Nebivolol Swelling   Chest pain (reaction to Bystolic)   Ace Inhibitors Swelling, Other (See Comments)   Tongue swell   Morphine And Related Itching        Medication List     STOP taking these medications    clopidogrel 75 MG tablet Commonly known as: PLAVIX   Polyethylene Glycol 3350 Powd       TAKE these medications    allopurinol 100 MG tablet Commonly known as: ZYLOPRIM Take 1 tablet (100 mg total) by mouth daily.   carvedilol 12.5 MG tablet Commonly known as: COREG Take 1 tablet (12.5 mg total) by mouth 2 (two) times daily.   cinacalcet 30 MG tablet Commonly known as: SENSIPAR Take 1 tablet (30 mg total) by mouth daily.   diclofenac Sodium 1 % Gel Commonly known as: VOLTAREN Apply 1  application topically 4 (four) times daily as needed (pain).   hydrOXYzine 25 MG tablet Commonly known as: ATARAX/VISTARIL Take 1 tablet (25 mg total) by mouth 2 (two) times daily as needed for anxiety.   insulin lispro protamine-lispro (75-25) 100 UNIT/ML Susp injection Commonly known as: HUMALOG 75/25 MIX Inject 30-40 Units into the skin See admin instructions. Inject 40 units into the skin morning and 30 units in the evening   levothyroxine 200 MCG tablet Commonly known as: SYNTHROID Take 1 tablet (200 mcg total) by mouth daily at 6 (six) AM.   lubiprostone 24 MCG capsule Commonly known as: AMITIZA Take 24 mcg by mouth 2 (two) times daily.   magnesium oxide 400 (241.3 Mg) MG tablet Commonly known as: MAG-OX Take 1 tablet (400 mg total) by mouth 2 (two) times daily.   metoCLOPramide 5 MG tablet Commonly known as: REGLAN Take 1 tablet (5 mg total) by mouth  3 (three) times daily.   metolazone 5 MG tablet Commonly known as: ZAROXOLYN Take 1 tablet (5 mg total) by mouth 3 (three) times a week. Takes 5mg  on Monday, Wednesday and Friday   mirabegron ER 50 MG Tb24 tablet Commonly known as: MYRBETRIQ Take 50 mg by mouth daily.   multivitamin with minerals Tabs tablet Take 1 tablet by mouth daily.   oxyCODONE-acetaminophen 10-325 MG tablet Commonly known as: PERCOCET Take 1 tablet by mouth 4 (four) times daily as needed for pain.   pantoprazole 40 MG tablet Commonly known as: PROTONIX Take 1 tablet (40 mg total) by mouth 2 (two) times daily before a meal. What changed: See the new instructions.   polyethylene glycol 17 g packet Commonly known as: MIRALAX / GLYCOLAX Take 17 g by mouth daily.   polyvinyl alcohol 1.4 % ophthalmic solution Commonly known as: LIQUIFILM TEARS Place 2 drops into both eyes daily as needed for dry eyes.   Potassium Chloride ER 20 MEQ Tbcr Take 1 tablet by mouth 3 (three) times a week. Tuesday, Thursdays and Saturdays   senna-docusate 8.6-50  MG tablet Commonly known as: Senokot-S Take 1 tablet by mouth at bedtime as needed for moderate constipation.   torsemide 100 MG tablet Commonly known as: DEMADEX Take 1 tablet (100 mg total) by mouth 2 (two) times daily.   Trulicity 1.5 QV/9.5GL Sopn Generic drug: Dulaglutide Inject 1.5 mg into the skin once a week. Monday What changed:  when to take this additional instructions        Follow-up Information     [name], MD. Schedule an appointment as soon as possible for a visit.   Specialty: Internal Medicine Contact information: [place] [address] [phone]                Allergies  Allergen Reactions   Nebivolol Swelling    Chest pain (reaction to Bystolic)   Ace Inhibitors Swelling and Other (See Comments)    Tongue swell   Morphine And Related Itching    You were cared for by a hospitalist during your hospital stay. If you have any questions about your discharge medications or the care you received while you were in the hospital after you are discharged, you can call the unit and asked to speak with the hospitalist on call if the hospitalist that took care of you is not available. Once you are discharged, your primary care physician will handle any further medical issues. Please note that no refills for any discharge medications will be authorized once you are discharged, as it is imperative that you return to your primary care physician (or establish a relationship with a primary care physician if you do not have one) for your aftercare needs so that they can reassess your need for medications and monitor your lab values.   Procedures/Studies: DG CHEST PORT 1 VIEW  Result Date: 07/04/2021 CLINICAL DATA:  Shortness of breath, cough, and congestion. EXAM: PORTABLE CHEST 1 VIEW COMPARISON:  07/02/2021 FINDINGS: The cardiac silhouette remains enlarged. The interstitial markings are mildly increased diffusely compared to the prior  study. No acute airspace consolidation, sizeable pleural effusion, or pneumothorax is identified. 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 IMPRESSION: Cardiomegaly with increased interstitial markings which may reflect early edema. Electronically Signed   By: Logan Bores M.D.   On: 07/04/2021 08:40   DG CHEST PORT 1 VIEW  Result Date: 07/02/2021 CLINICAL DATA:  70 year old female with wheezing. EXAM: PORTABLE CHEST 1 VIEW COMPARISON:  Portable chest 07/01/2021 and earlier. FINDINGS: Portable AP semi upright view at 0913 hours. Stable cardiomegaly and mediastinal contours. Larger lung volumes. Visualized tracheal air column is within normal limits. No pneumothorax or pleural effusion. Pulmonary vascularity appears stable without acute edema. Hazy opacity from chronic right anterior upper lung increased pleural fat again noted. No acute pulmonary opacity. Stable visualized osseous structures. IMPRESSION: Stable cardiomegaly. No acute cardiopulmonary abnormality. Electronically Signed   By: Genevie Ann M.D.   On: 07/02/2021 09:23   DG Chest Port 1 View  Result Date: 07/01/2021 CLINICAL DATA:  70 year old female with possible sepsis. EXAM: PORTABLE CHEST 1 VIEW COMPARISON:  Portable chest 02/15/2021 and earlier. FINDINGS: Portable AP semi upright view at 0417 hours. Mildly lower lung volumes. Stable cardiomegaly and mediastinal contours. Visualized tracheal air column is within normal limits. No pneumothorax, pulmonary edema, pleural effusion or consolidation. Vague chronic increased opacity in the right upper lobe is seen related to focal increased pleural fat or fatty pleural mass on a 2020 chest CT (benign). No acute pulmonary opacity. Degenerative changes at the right shoulder. No acute osseous abnormality identified. Paucity of bowel gas in the upper abdomen. IMPRESSION: No acute cardiopulmonary abnormality. Chronic cardiomegaly. Benign increased pleural fat or lipoma  in the right upper lung. Electronically Signed   By: Genevie Ann M.D.   On: 07/01/2021 04:53   VAS [name] LOWER EXTREMITY VENOUS (DVT) ([facility] and [facility] 7a-7p)  Result Date: 07/01/2021  Lower Venous DVT Study Patient Name:  MAYLYN NARVAIZ  Date of Exam:   07/01/2021 Medical Rec #: [id number]          Accession #:    [id number] Date of Birth: 09/24/1951           Patient Gender: F Patient Age:   60 years Exam Location:  [facility] Procedure:      VAS [name] LOWER EXTREMITY VENOUS (DVT) Referring Phys: KRISTIE HORTON --------------------------------------------------------------------------------  Indications: Edema.  Limitations: Patient unable to reposition, pain/intolerance to probe pressure, body habitus/tissue properties, acoustic shadowing due to overlying arterial calcification. Comparison Study: 04-27-2021 Most recent prior waas negative for DVT. Performing Technologist: Darlin Coco RDMS, RVT  Examination Guidelines: A complete evaluation includes B-mode imaging, spectral Doppler, color Doppler, and power Doppler as needed of all accessible portions of each vessel. Bilateral testing is considered an integral part of a complete examination. Limited examinations for reoccurring indications may be performed as noted. The reflux portion of the exam is performed with the patient in reverse Trendelenburg.  +---------+---------------+---------+-----------+----------+--------------+ RIGHT    CompressibilityPhasicitySpontaneityPropertiesThrombus Aging +---------+---------------+---------+-----------+----------+--------------+ CFV      Full           Yes      Yes                                 +---------+---------------+---------+-----------+----------+--------------+ SFJ      Full                                                        +---------+---------------+---------+-----------+----------+--------------+ FV Prox  Full                                                         +---------+---------------+---------+-----------+----------+--------------+  FV Mid   Full                                                        +---------+---------------+---------+-----------+----------+--------------+ FV Distal               Yes      Yes                                 +---------+---------------+---------+-----------+----------+--------------+ PFV      Full                                                        +---------+---------------+---------+-----------+----------+--------------+ POP      Full           Yes      Yes                                 +---------+---------------+---------+-----------+----------+--------------+ PTV                                                   Not visualized +---------+---------------+---------+-----------+----------+--------------+ PERO                                                  Not visualized +---------+---------------+---------+-----------+----------+--------------+   +---------+---------------+---------+-----------+----------+--------------+ LEFT     CompressibilityPhasicitySpontaneityPropertiesThrombus Aging +---------+---------------+---------+-----------+----------+--------------+ CFV      Full           Yes      Yes                                 +---------+---------------+---------+-----------+----------+--------------+ SFJ      Full                                                        +---------+---------------+---------+-----------+----------+--------------+ FV Prox  Full                                                        +---------+---------------+---------+-----------+----------+--------------+ FV Mid   Full                                                        +---------+---------------+---------+-----------+----------+--------------+ FV Distal  Yes      Yes                                  +---------+---------------+---------+-----------+----------+--------------+ PFV      Full                                                        +---------+---------------+---------+-----------+----------+--------------+ POP      Full           Yes      Yes                                 +---------+---------------+---------+-----------+----------+--------------+ PTV      Full                                                        +---------+---------------+---------+-----------+----------+--------------+ PERO     Full                                                        +---------+---------------+---------+-----------+----------+--------------+     Summary: RIGHT: - There is no evidence of deep vein thrombosis in the lower extremity.  - No cystic structure found in the popliteal fossa.  - Incidental: Heavy arterial calcification with monophasic flow observed throughout lower extremity.  LEFT: - There is no evidence of deep vein thrombosis in the lower extremity.  - No cystic structure found in the popliteal fossa.  - Incidental: Heavy arterial calcification with monophasic flow observed throughout lower extremity.  *See table(s) above for measurements and observations. Electronically signed by Servando Snare MD on 07/01/2021 at 5:22:34 PM.    Final      The results of significant diagnostics from this hospitalization (including imaging, microbiology, ancillary and laboratory) are listed below for reference.     Microbiology: Recent Results (from the past 240 hour(s))  Urine Culture     Status: None   Collection Time: 07/01/21  3:21 AM   Specimen: In/Out Cath Urine  Result Value Ref Range Status   Specimen Description IN/OUT CATH URINE  Final   Special Requests NONE  Final   Culture   Final    NO GROWTH Performed at [facility], [address], [place], [place] [address]    Report Status 07/03/2021 FINAL  Final  Blood culture (routine single)     Status: None    Collection Time: 07/01/21  4:01 AM   Specimen: BLOOD  Result Value Ref Range Status   Specimen Description BLOOD RIGHT ANTECUBITAL  Final   Special Requests   Final    BOTTLES DRAWN AEROBIC AND ANAEROBIC Blood Culture adequate volume   Culture   Final    NO GROWTH 5 DAYS Performed at [facility], [place] [address] [place], [place] [address]    Report Status 07/06/2021 FINAL  Final  SARS CORONAVIRUS 2 (  TAT 6-24 HRS) Nasopharyngeal Nasopharyngeal Swab     Status: None   Collection Time: 07/01/21 10:26 AM   Specimen: Nasopharyngeal Swab  Result Value Ref Range Status   SARS Coronavirus 2 NEGATIVE NEGATIVE Final    Comment: (NOTE) SARS-CoV-2 target nucleic acids are NOT DETECTED.  The SARS-CoV-2 RNA is generally detectable in upper and lower respiratory specimens during the acute phase of infection. Negative results do not preclude SARS-CoV-2 infection, do not rule out co-infections with other pathogens, and should not be used as the sole basis for treatment or other patient management decisions. Negative results must be combined with clinical observations, patient history, and epidemiological information. The expected result is Negative.  Fact Sheet for Patients: SugarRoll.be  Fact Sheet for Healthcare Providers: https://www.woods-mathews.com/  This test is not yet approved or cleared by the [place] FDA and  has been authorized for detection and/or diagnosis of SARS-CoV-2 by FDA under an Emergency Use Authorization (EUA). This EUA will remain  in effect (meaning this test can be used) for the duration of the COVID-19 declaration under Se ction 564(b)(1) of the Act, 21 U.S.C. section 360bbb-3(b)(1), unless the authorization is terminated or revoked sooner.  Performed at [facility], [place] [address] [place], [place] [address]      Labs: BNP (last 3 results) Recent Labs    01/05/21 1646 02/16/21 0500  07/06/21 0110  BNP 151.3* 170.3* 924.2*   Basic Metabolic Panel: Recent Labs  Lab 07/06/21 0110 07/07/21 0303 07/08/21 0229 07/09/21 0547 07/10/21 0305  NA 136 137 137 138 136  K 4.4 4.2 4.4 4.7 5.2*  CL 99 99 100 100 98  CO2 27 28 26 27 28   GLUCOSE 292* 234* 254* 284* 262*  BUN 119* 122* 117* 125* 124*  CREATININE 2.36* 2.67* 2.53* 2.49* 2.41*  CALCIUM 8.8* 9.1 8.9 8.9 9.1  MG 1.6* 1.6* 1.6* 1.6* 1.7   Liver Function Tests: No results for input(s): AST, ALT, ALKPHOS, BILITOT, PROT, ALBUMIN in the last 168 hours. No results for input(s): LIPASE, AMYLASE in the last 168 hours. No results for input(s): AMMONIA in the last 168 hours. CBC: Recent Labs  Lab 07/06/21 0110 07/07/21 0303 07/08/21 0229 07/09/21 0337 07/10/21 0305  WBC 9.3 12.3* 10.6* 13.6* 13.9*  HGB 7.7* 8.4* 8.0* 7.9* 8.1*  HCT 26.1* 28.1* 27.5* 26.6* 26.9*  MCV 82.3 81.2 82.6 80.9 81.8  PLT 278 302 311 306 353   Cardiac Enzymes: No results for input(s): CKTOTAL, CKMB, CKMBINDEX, TROPONINI in the last 168 hours. BNP: Invalid input(s): POCBNP CBG: Recent Labs  Lab 07/09/21 1631 07/09/21 2111 07/10/21 0620 07/10/21 0913 07/10/21 1200  GLUCAP 364* 386* 193* 166* 172*   D-Dimer No results for input(s): DDIMER in the last 72 hours. Hgb A1c No results for input(s): HGBA1C in the last 72 hours. Lipid Profile No results for input(s): CHOL, HDL, LDLCALC, TRIG, CHOLHDL, LDLDIRECT in the last 72 hours. Thyroid function studies No results for input(s): TSH, T4TOTAL, T3FREE, THYROIDAB in the last 72 hours.  Invalid input(s): FREET3 Anemia work up No results for input(s): VITAMINB12, FOLATE, FERRITIN, TIBC, IRON, RETICCTPCT in the last 72 hours. Urinalysis    Component Value Date/Time   COLORURINE STRAW (A) 07/02/2021 1050   APPEARANCEUR CLEAR 07/02/2021 1050   LABSPEC 1.010 07/02/2021 1050   PHURINE 5.0 07/02/2021 1050   GLUCOSEU NEGATIVE 07/02/2021 1050   HGBUR NEGATIVE 07/02/2021 [place] 07/02/2021 [place] 07/02/2021 1050   PROTEINUR  NEGATIVE 07/02/2021 1050   UROBILINOGEN 0.2 08/04/2014 1409   NITRITE NEGATIVE 07/02/2021 1050   LEUKOCYTESUR NEGATIVE 07/02/2021 1050   Sepsis Labs Invalid input(s): PROCALCITONIN,  WBC,  LACTICIDVEN Microbiology Recent Results (from the past 240 hour(s))  Urine Culture     Status: None   Collection Time: 07/01/21  3:21 AM   Specimen: In/Out Cath Urine  Result Value Ref Range Status   Specimen Description IN/OUT CATH URINE  Final   Special Requests NONE  Final   Culture   Final    NO GROWTH Performed at [facility], [place] [address], [place], [place] [address]    Report Status 07/03/2021 FINAL  Final  Blood culture (routine single)     Status: None   Collection Time: 07/01/21  4:01 AM   Specimen: BLOOD  Result Value Ref Range Status   Specimen Description BLOOD RIGHT ANTECUBITAL  Final   Special Requests   Final    BOTTLES DRAWN AEROBIC AND ANAEROBIC Blood Culture adequate volume   Culture   Final    NO GROWTH 5 DAYS Performed at [facility], [place] [address], [place], [place] [address]    Report Status 07/06/2021 FINAL  Final  SARS CORONAVIRUS 2 (TAT 6-24 HRS) Nasopharyngeal Nasopharyngeal Swab     Status: None   Collection Time: 07/01/21 10:26 AM   Specimen: Nasopharyngeal Swab  Result Value Ref Range Status   SARS Coronavirus 2 NEGATIVE NEGATIVE Final    Comment: (NOTE) SARS-CoV-2 target nucleic acids are NOT DETECTED.  The SARS-CoV-2 RNA is generally detectable in upper and lower respiratory specimens during the acute phase of infection. Negative results do not preclude SARS-CoV-2 infection, do not rule out co-infections with other pathogens, and should not be used as the sole basis for treatment or other patient management decisions. Negative results must be combined with clinical observations, patient history, and epidemiological information. The expected result is  Negative.  Fact Sheet for Patients: SugarRoll.be  Fact Sheet for Healthcare Providers: https://www.woods-mathews.com/  This test is not yet approved or cleared by the [place] FDA and  has been authorized for detection and/or diagnosis of SARS-CoV-2 by FDA under an Emergency Use Authorization (EUA). This EUA will remain  in effect (meaning this test can be used) for the duration of the COVID-19 declaration under Se ction 564(b)(1) of the Act, 21 U.S.C. section 360bbb-3(b)(1), unless the authorization is terminated or revoked sooner.  Performed at [facility], [place] [address] [place], [place] [address]      Time coordinating discharge:  I have spent 35 minutes face to face with the patient and on the ward discussing the patients care, assessment, plan and disposition with other care givers. >50% of the time was devoted counseling the patient about the risks and benefits of treatment/Discharge disposition and coordinating care.   SIGNED:   Damita Lack, MD  [facility] 07/10/2021, 12:17 PM   If 7PM-7AM, please contact night-coverage

## 2021-07-10 NOTE — TOC Transition Note (Signed)
Transition of Care [facility]) - CM/SW Discharge Note   Patient Details  Name: [name] [name] MRN: [id number] Date of Birth: January 01, 1951  Transition of Care [facility]) CM/SW Contact:  Milinda Antis, [place] Phone Number: 07/10/2021, 1:41 PM   Clinical Narrative:     Patient will DC to:  [facility] Anticipated DC date:  07/10/2021 Transport by:  Corey Harold   Per MD patient ready for DC to SNF. RN to call report prior to discharge [phone] . RN, patient, and facility notified of DC. Discharge Summary and FL2 sent to facility. DC packet on chart. Ambulance transport will be requested for patient when RN reports that the patient is ready.   CSW will sign off for now as social work intervention is no longer needed. Please consult [name] again if new needs arise.      Barriers to Discharge: Ship broker, SNF Pending bed offer   Patient Goals and CMS Choice Patient states their goals for this hospitalization and ongoing recovery are:: To return home CMS Medicare.gov Compare Post Acute Care list provided to:: Patient Choice offered to / list presented to : Patient  Discharge Placement                       Discharge Plan and Services                                     Social Determinants of Health (SDOH) Interventions     Readmission Risk Interventions Readmission Risk Prevention Plan 01/14/2020  Transportation Screening Complete  Medication Review Press photographer) Complete  PCP or Specialist appointment within 3-5 days of discharge Complete  HRI or Home Care Consult Complete  SW Recovery Care/Counseling Consult Complete  Palliative Care Screening Not [place] Complete  Some recent data might be hidden

## 2021-07-10 NOTE — TOC Progression Note (Addendum)
Transition of Care [facility]) - Initial/Assessment Note    Patient Details  Name: [name] [name] MRN: [id number] Date of Birth: 08/16/1951  Transition of Care [facility]) CM/SW Contact:    Milinda Antis, LCSWA Phone Number: 07/10/2021, 11:38 AM  Clinical Narrative:                 09:45-  CSW received a call from [place] with admissions at [facility].  The facility is now unable to accept the patient.  10:15-  CSW met with the patient at bedside and informed the patient of the above information.  The patient stated that she is willing to return to [place].  CSW called [facility] while in the room with the patient.  All of the patient's questions were answered and the facility agreed to accept the patient.   11:30-  CSW called [employer] medicare to change the facility choice.  CSW was informed that a new authorization would have to be started.  Reference number is [id number].    Pending: insurance auth.   12:41-  CSW received a call from [facility] Medicare.  The authorization has been approved from 10/1-10/4.  Expected Discharge Plan: Skilled Nursing Facility Barriers to Discharge: Insurance Authorization, SNF Pending bed offer   Patient Goals and CMS Choice Patient states their goals for this hospitalization and ongoing recovery are:: To return home CMS Medicare.gov Compare Post Acute Care list provided to:: Patient Choice offered to / list presented to : Patient  Expected Discharge Plan and Services Expected Discharge Plan: [place]       Living arrangements for the past 2 months: Apartment Expected Discharge Date: 07/10/21                                    Prior Living Arrangements/Services Living arrangements for the past 2 months: Apartment Lives with:: Adult Children Patient language and need for interpreter reviewed:: Yes Do you feel safe going back to the place where you live?: Yes      Need for Family Participation in Patient Care: Yes (Comment) Care giver  support system in place?: No (comment)   Criminal Activity/Legal Involvement Pertinent to Current Situation/Hospitalization: No - Comment as needed  Activities of Daily Living Home Assistive Devices/Equipment: CBG Meter, Blood pressure cuff, Cane (specify quad or straight), Eyeglasses, Grab bars in shower ADL Screening (condition at time of admission) Patient's cognitive ability adequate to safely complete daily activities?: Yes Is the patient deaf or have difficulty hearing?: No Does the patient have difficulty seeing, even when wearing glasses/contacts?: No Does the patient have difficulty concentrating, remembering, or making decisions?: No Patient able to express need for assistance with ADLs?: Yes Does the patient have difficulty dressing or bathing?: No Independently performs ADLs?: No Communication: Independent Dressing (OT): Needs assistance Is this a change from baseline?: Change from baseline, expected to last <3days Grooming: Independent Feeding: Independent Bathing: Needs assistance Is this a change from baseline?: Change from baseline, expected to last <3 days Toileting: Needs assistance Is this a change from baseline?: Change from baseline, expected to last <3 days In/Out Bed: Needs assistance Is this a change from baseline?: Change from baseline, expected to last <3 days Walks in Home: Needs assistance Is this a change from baseline?: Change from baseline, expected to last <3 days Does the patient have difficulty walking or climbing stairs?: Yes Weakness of Legs: Both Weakness of Arms/Hands: Both  Permission Sought/Granted  Permission granted to share information with : Yes, Verbal Permission Granted     Permission granted to share info w AGENCY: SNF        Emotional Assessment Appearance:: Appears stated age Attitude/Demeanor/Rapport: Engaged Affect (typically observed): Accepting, Pleasant Orientation: : Oriented to Self, Oriented to Place, Oriented to   Time, Oriented to Situation Alcohol / Substance Use: Not Applicable Psych Involvement: No (comment)  Admission diagnosis:  Cellulitis of leg, right [L03.115] Cellulitis [L03.90] Patient Active Problem List   Diagnosis Date Noted   Chronic diastolic (congestive) heart failure (HCC) 07/01/2021   Chronic pain syndrome 07/01/2021   COPD (chronic obstructive pulmonary disease) ([place]) 07/01/2021   Cellulitis of leg, right 07/01/2021   SARS-CoV-2 positive 02/21/2021   Increased weakness when ambulating 02/17/2021   Intractable nausea and vomiting 02/15/2021   Symptomatic anemia 01/24/2021   Anemia, unspecified 01/08/2020   Bacteremia 01/07/2020   UTI (urinary tract infection) 01/06/2020   Anemia 10/26/2019   S/P lumbar fusion 09/18/2019   Chronic CHF (congestive heart failure) ([place]) 03/16/2019   Leukocytosis 03/16/2019   Chest pain 11/27/2018   Pre-syncope 11/27/2018   Epigastric abdominal pain 11/27/2018   Solitary pulmonary nodule 11/06/2018   Hypertensive heart and renal disease, stage 1-4 or unspecified chronic kidney disease, with heart failure ([place]) 10/20/2018   CHF (congestive heart failure), NYHA class III, acute on chronic, systolic ([place]) 44/12/4740   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) 08/11/2014   Asthma, chronic 08/11/2014   Hypothyroidism 08/11/2014   OSA (obstructive sleep apnea) 08/11/2014   Positive cardiac stress test 06/29/2014   Hyperlipidemia 02/12/2014   Left arm weakness 12/23/2013   TIA (transient ischemic attack) 12/23/2013   Left  buttock abscess 10/12/2013   Cellulitis and abscess of leg, right 04/16/2013   Acute blood loss anemia 02/14/2013   HTN (hypertension) 02/14/2013   Cellulitis and abscess of buttock 02/09/2013   Class 2 obesity due to excess calories with body mass index (BMI) of 36.0 to 36.9 in adult 02/03/2013   Diarrhea 02/03/2013   Hyponatremia 02/03/2013   Sepsis due to undetermined organism ([place]) 02/02/2013   Diabetes mellitus without complication ([place]) 64/33/2951   CKD (chronic kidney disease), stage IV ([place]) 02/02/2013   Cellulitis 02/02/2013   PAD (peripheral artery disease) ([place]) 04/26/2011   Tobacco abuse 04/26/2011   PCP:  [name], MD Pharmacy:   [facility] [place], [address] AT [place] [address] Phone: [phone] Fax: [phone]  [facility] - [place] - [address] [place] [address] Phone: [phone] Fax: [phone]     Social Determinants of Health (SDOH) Interventions    Readmission Risk Interventions Readmission Risk Prevention Plan 01/14/2020  Transportation Screening Complete  Medication Review Press photographer) Complete  PCP or Specialist appointment within 3-5 days of discharge Complete  HRI or Home Care Consult Complete  SW Recovery Care/Counseling Consult Complete  Palliative Care Screening Not Applicable  Skilled Nursing Facility Complete  Some  recent data might be hidden

## 2021-07-10 NOTE — Plan of Care (Signed)

## 2021-07-10 NOTE — Progress Notes (Signed)
Physical Therapy Treatment Patient Details Name: [name] [name] MRN: [id number] DOB: [birth year] Today's Date: 07/10/2021   History of Present Illness Pt is a 70 yo female admitted for cellulitis of the LLE. Pt s/p enteroscopy 07/10/21 for abnormal small bowel pillcam showing possible proximal small bowel bleeding. PMH of COPD, DM, HTN, CKD, PVD and chronic pain in the R shoulder.    PT Comments    Patient progressing slowly towards PT goals. Attempting to eat breakfast on arrival and slumped over in bed spilling all over herself. Assisted with clean up and changing gown. Pt requires Mod A for bed mobility and standing from elevated surface. Tolerated short distance ambulation with Min A for balance, RW management and support due to bil knee instability and fatigue/weakness. No dyspnea noted today.  Continues to be a fall risk and appropriate for SNF. Will follow.   Recommendations for follow up therapy are one component of a multi-disciplinary discharge planning process, led by the attending physician.  Recommendations may be updated based on patient status, additional functional criteria and insurance authorization.  Follow Up Recommendations  SNF     Equipment Recommendations  None recommended by PT    Recommendations for Other Services       Precautions / Restrictions Precautions Precautions: Fall Restrictions Weight Bearing Restrictions: No     Mobility  Bed Mobility Overal bed mobility: Needs Assistance Bed Mobility: Supine to Sit     Supine to sit: Mod assist;HOB elevated     General bed mobility comments: Assist to bring RLE to EOB, scoot bottom and elevate trunk to get upright. Increased time.    Transfers Overall transfer level: Needs assistance Equipment used: Rolling walker (2 wheeled) Transfers: Sit to/from Stand Sit to Stand: Mod assist;From elevated surface         General transfer comment: Assist to power to standing with use of momentum and wide  BoS, transferred to chair post ambulation.  Ambulation/Gait Ambulation/Gait assistance: Min assist Gait Distance (Feet): 5 Feet Assistive device: Rolling walker (2 wheeled) Gait Pattern/deviations: Step-to pattern;Decreased step length - right;Decreased step length - left;Shuffle;Wide base of support;Step-through pattern Gait velocity: decreased Gait velocity interpretation: <1.31 ft/sec, indicative of household ambulator General Gait Details: Slow, shuffling like gait with wide BoS and bil knee instability noted RLE>LLE; assist with RW management and balance.   Stairs             Wheelchair Mobility    Modified Rankin (Stroke Patients Only)       Balance Overall balance assessment: Needs assistance Sitting-balance support: Feet supported;No upper extremity supported Sitting balance-Leahy Scale: Fair     Standing balance support: During functional activity Standing balance-Leahy Scale: Poor Standing balance comment: Requires UE support and use of RW/close Min gaurd-Min A                            Cognition Arousal/Alertness: Awake/alert Behavior During Therapy: WFL for tasks assessed/performed Overall Cognitive Status: Within Functional Limits for tasks assessed                                 General Comments: Pt upset that she was slumped over in bed and spilling her breakfast all over herself and it was cold.      Exercises      General Comments General comments (skin integrity, edema, etc.): No dyspnea noted today.  Pertinent Vitals/Pain Pain Assessment: Faces Faces Pain Scale: Hurts a little bit Pain Location: LLE, RUE Pain Descriptors / Indicators: Guarding Pain Intervention(s): Monitored during session;Limited activity within patient's tolerance    Home Living                      Prior Function            PT Goals (current goals can now be found in the care plan section) Progress towards PT goals:  Progressing toward goals (slowly)    Frequency    Min 2X/week      PT Plan Current plan remains appropriate    Co-evaluation              AM-PAC PT "6 Clicks" Mobility   Outcome Measure  Help needed turning from your back to your side while in a flat bed without using bedrails?: A Little Help needed moving from lying on your back to sitting on the side of a flat bed without using bedrails?: A Lot Help needed moving to and from a bed to a chair (including a wheelchair)?: A Lot Help needed standing up from a chair using your arms (e.g., wheelchair or bedside chair)?: A Lot Help needed to walk in hospital room?: A Little Help needed climbing 3-5 steps with a railing? : Total 6 Click Score: 13    End of Session Equipment Utilized During Treatment: Gait belt Activity Tolerance: Patient limited by fatigue Patient left: in chair;with call bell/phone within reach Nurse Communication: Mobility status PT Visit Diagnosis: Muscle weakness (generalized) (M62.81);Unsteadiness on feet (R26.81)     Time: 5361-4431 PT Time Calculation (min) (ACUTE ONLY): 23 min  Charges:  $Therapeutic Activity: 23-37 mins                     Marisa Severin, PT, DPT Acute Rehabilitation Services Pager [phone] Office [phone]      Marguarite Arbour A Sabra Heck 07/10/2021, 12:11 PM

## 2021-07-10 NOTE — Interval H&P Note (Signed)
History and Physical Interval Note: 70/female for a small bowel enteroscopy for abnormal small bowel pillcam showing possible proximal small bowel bleeding.  07/10/2021 8:02 AM  [name]  has presented today for enteroscopy, with the diagnosis of Subacute GI bleeding positive capsule.  The various methods of treatment have been discussed with the patient and family. After consideration of risks, benefits and other options for treatment, the patient has consented to  Procedure(s): ENTEROSCOPY (N/A) as a surgical intervention.  The patient's history has been reviewed, patient examined, no change in status, stable for surgery.  I have reviewed the patient's chart and labs.  Questions were answered to the patient's satisfaction.     Ronnette Juniper

## 2021-07-10 NOTE — Transfer of Care (Signed)
Immediate Anesthesia Transfer of Care Note  Patient: [name] [name]  Procedure(s) Performed: ENTEROSCOPY HOT HEMOSTASIS (ARGON PLASMA COAGULATION/BICAP)  Patient Location: PACU  Anesthesia Type:MAC  Level of Consciousness: awake, alert  and oriented  Airway & Oxygen Therapy: Patient Spontanous Breathing and Patient connected to face mask oxygen  Post-op Assessment: Report given to RN and Post -op Vital signs reviewed and stable  Post vital signs: Reviewed and stable  Last Vitals:  Vitals Value Taken Time  BP 126/66 07/10/21 0856  Temp    Pulse 90 07/10/21 0901  Resp 19 07/10/21 0901  SpO2 100 % 07/10/21 0901  Vitals shown include unvalidated device data.  Last Pain:  Vitals:   07/10/21 0741  TempSrc: Temporal  PainSc: 0-No pain      Patients Stated Pain Goal: 1 (79/89/21 1941)  Complications: No notable events documented.

## 2021-07-10 NOTE — Progress Notes (Signed)
OT Cancellation Note  Patient Details Name: BERKELEY VELDMAN MRN: [id number] DOB: 05/03/1951   Cancelled Treatment:    Reason Eval/Treat Not Completed: Patient at procedure or test/ unavailable.  Pt off floor getting enteroscopy. Will follow up as time allows.  Jolaine Artist, OT Acute Rehabilitation Services Pager [phone] Office [phone]    Delight Stare 07/10/2021, 8:55 AM

## 2021-07-10 NOTE — Progress Notes (Signed)
PT Cancellation Note  Patient Details Name: [name] [name] MRN: [id number] DOB: [birth year]   Cancelled Treatment:    Reason Eval/Treat Not Completed: Patient at procedure or test/unavailable Pt off floor getting enteroscopy. Will follow up as time allows.   Marguarite Arbour A Eugen Jeansonne 07/10/2021, 7:47 AM Marisa Severin, PT, DPT Acute Rehabilitation Services Pager [phone] Office [phone]

## 2021-07-10 NOTE — Progress Notes (Signed)
Report given to F[place], staff nurse at [facility]. All questions and concerns were fully answered.

## 2021-07-11 ENCOUNTER — Encounter (HOSPITAL_COMMUNITY): Payer: 59

## 2021-07-11 NOTE — Progress Notes (Signed)
PTAR came to get patient.  She is alert and oriented x4.  Pain medication given at 0407 Oxy 5mg  PO.

## 2021-07-12 ENCOUNTER — Encounter (HOSPITAL_COMMUNITY): Payer: Self-pay | Admitting: Gastroenterology

## 2021-07-13 ENCOUNTER — Ambulatory Visit (HOSPITAL_COMMUNITY)
Admission: RE | Admit: 2021-07-13 | Discharge: 2021-07-13 | Disposition: A | Discharge status: Home / Self Care | Payer: No Typology Code available for payment source | Source: Ambulatory Visit | Attending: Internal Medicine | Admitting: Internal Medicine

## 2021-07-13 ENCOUNTER — Other Ambulatory Visit: Payer: Self-pay

## 2021-07-13 VITALS — BP 117/62 | HR 86 | Temp 98.4°F | Resp 18

## 2021-07-13 DIAGNOSIS — D631 Anemia in chronic kidney disease: Secondary | ICD-10-CM | POA: Diagnosis not present

## 2021-07-13 DIAGNOSIS — N185 Chronic kidney disease, stage 5: Secondary | ICD-10-CM | POA: Diagnosis not present

## 2021-07-13 DIAGNOSIS — N184 Chronic kidney disease, stage 4 (severe): Secondary | ICD-10-CM | POA: Diagnosis present

## 2021-07-13 DIAGNOSIS — N179 Acute kidney failure, unspecified: Secondary | ICD-10-CM | POA: Diagnosis present

## 2021-07-13 LAB — RENAL FUNCTION PANEL
Albumin: 2.8 g/dL — ABNORMAL LOW (ref 3.5–5.0)
Anion gap: 10 (ref 5–15)
BUN: 127 mg/dL — ABNORMAL HIGH (ref 8–23)
CO2: 28 mmol/L (ref 22–32)
Calcium: 9.2 mg/dL (ref 8.9–10.3)
Chloride: 100 mmol/L (ref 98–111)
Creatinine, Ser: 2.69 mg/dL — ABNORMAL HIGH (ref 0.44–1.00)
GFR, Estimated: 18 mL/min — ABNORMAL LOW (ref >60)
Glucose, Bld: 111 mg/dL — ABNORMAL HIGH (ref 70–99)
Phosphorus: 4.8 mg/dL — ABNORMAL HIGH (ref 2.5–4.6)
Potassium: 4.3 mmol/L (ref 3.5–5.1)
Sodium: 138 mmol/L (ref 135–145)

## 2021-07-13 LAB — POCT HEMOGLOBIN-HEMACUE: Hemoglobin: 9.1 g/dL — ABNORMAL LOW (ref 12.0–15.0)

## 2021-07-13 MED ORDER — EPOETIN ALFA-EPBX 40000 UNIT/ML IJ SOLN
INTRAMUSCULAR | Status: AC
  Filled 2021-07-13: qty 2

## 2021-07-13 MED ORDER — EPOETIN ALFA-EPBX 40000 UNIT/ML IJ SOLN
60000.0000 [IU] | INTRAMUSCULAR | Status: DC
  Administered 2021-07-13: 60000 [IU] via SUBCUTANEOUS

## 2021-07-14 LAB — PTH, INTACT AND CALCIUM
Calcium, Total (PTH): 9.2 mg/dL (ref 8.7–10.3)
PTH: 46 pg/mL (ref 15–65)

## 2021-07-27 ENCOUNTER — Other Ambulatory Visit: Payer: Self-pay

## 2021-07-27 ENCOUNTER — Ambulatory Visit (HOSPITAL_COMMUNITY)
Admission: RE | Admit: 2021-07-27 | Discharge: 2021-07-27 | Disposition: A | Discharge status: Home / Self Care | Payer: 59 | Source: Ambulatory Visit | Attending: Internal Medicine | Admitting: Internal Medicine

## 2021-07-27 VITALS — BP 148/68 | HR 82 | Temp 97.2°F

## 2021-07-27 DIAGNOSIS — N179 Acute kidney failure, unspecified: Secondary | ICD-10-CM | POA: Diagnosis present

## 2021-07-27 DIAGNOSIS — N184 Chronic kidney disease, stage 4 (severe): Secondary | ICD-10-CM | POA: Insufficient documentation

## 2021-07-27 DIAGNOSIS — D631 Anemia in chronic kidney disease: Secondary | ICD-10-CM | POA: Insufficient documentation

## 2021-07-27 LAB — IRON AND TIBC
Iron: 49 ug/dL (ref 28–170)
Saturation Ratios: 12 % (ref 10.4–31.8)
TIBC: 407 ug/dL (ref 250–450)
UIBC: 358 ug/dL

## 2021-07-27 LAB — FERRITIN: Ferritin: 90 ng/mL (ref 11–307)

## 2021-07-27 LAB — POCT HEMOGLOBIN-HEMACUE: Hemoglobin: 10 g/dL — ABNORMAL LOW (ref 12.0–15.0)

## 2021-07-27 MED ORDER — EPOETIN ALFA-EPBX 40000 UNIT/ML IJ SOLN
INTRAMUSCULAR | Status: AC
  Filled 2021-07-27: qty 2

## 2021-07-27 MED ORDER — EPOETIN ALFA-EPBX 40000 UNIT/ML IJ SOLN
60000.0000 [IU] | INTRAMUSCULAR | Status: DC
  Administered 2021-07-27: 60000 [IU] via SUBCUTANEOUS

## 2021-08-10 ENCOUNTER — Encounter (HOSPITAL_COMMUNITY)
Admission: RE | Admit: 2021-08-10 | Discharge: 2021-08-10 | Disposition: A | Discharge status: Home / Self Care | Payer: 59 | Source: Ambulatory Visit | Attending: Internal Medicine | Admitting: Internal Medicine

## 2021-08-10 VITALS — BP 132/58 | HR 78 | Temp 97.3°F

## 2021-08-10 DIAGNOSIS — N184 Chronic kidney disease, stage 4 (severe): Secondary | ICD-10-CM | POA: Diagnosis present

## 2021-08-10 DIAGNOSIS — D631 Anemia in chronic kidney disease: Secondary | ICD-10-CM | POA: Insufficient documentation

## 2021-08-10 DIAGNOSIS — N179 Acute kidney failure, unspecified: Secondary | ICD-10-CM | POA: Diagnosis present

## 2021-08-10 LAB — RENAL FUNCTION PANEL
Albumin: 3 g/dL — ABNORMAL LOW (ref 3.5–5.0)
Anion gap: 12 (ref 5–15)
BUN: 167 mg/dL — ABNORMAL HIGH (ref 8–23)
CO2: 33 mmol/L — ABNORMAL HIGH (ref 22–32)
Calcium: 8.9 mg/dL (ref 8.9–10.3)
Chloride: 92 mmol/L — ABNORMAL LOW (ref 98–111)
Creatinine, Ser: 3.95 mg/dL — ABNORMAL HIGH (ref 0.44–1.00)
GFR, Estimated: 12 mL/min — ABNORMAL LOW (ref >60)
Glucose, Bld: 61 mg/dL — ABNORMAL LOW (ref 70–99)
Phosphorus: 5.8 mg/dL — ABNORMAL HIGH (ref 2.5–4.6)
Potassium: 3.2 mmol/L — ABNORMAL LOW (ref 3.5–5.1)
Sodium: 137 mmol/L (ref 135–145)

## 2021-08-10 LAB — POCT HEMOGLOBIN-HEMACUE: Hemoglobin: 10 g/dL — ABNORMAL LOW (ref 12.0–15.0)

## 2021-08-10 MED ORDER — EPOETIN ALFA-EPBX 40000 UNIT/ML IJ SOLN
INTRAMUSCULAR | Status: AC
  Filled 2021-08-10: qty 2

## 2021-08-10 MED ORDER — EPOETIN ALFA-EPBX 40000 UNIT/ML IJ SOLN
60000.0000 [IU] | INTRAMUSCULAR | Status: DC
  Administered 2021-08-10: 60000 [IU] via SUBCUTANEOUS

## 2021-08-12 LAB — PTH, INTACT AND CALCIUM
Calcium, Total (PTH): 9.2 mg/dL (ref 8.7–10.3)
PTH: 132 pg/mL — ABNORMAL HIGH (ref 15–65)

## 2021-08-24 ENCOUNTER — Other Ambulatory Visit: Payer: Self-pay

## 2021-08-24 ENCOUNTER — Encounter (HOSPITAL_COMMUNITY)
Admission: RE | Admit: 2021-08-24 | Discharge: 2021-08-24 | Disposition: A | Discharge status: Home / Self Care | Payer: 59 | Source: Ambulatory Visit | Attending: Internal Medicine | Admitting: Internal Medicine

## 2021-08-24 VITALS — BP 129/71 | HR 78 | Temp 97.2°F | Resp 18

## 2021-08-24 DIAGNOSIS — N179 Acute kidney failure, unspecified: Secondary | ICD-10-CM | POA: Diagnosis not present

## 2021-08-24 DIAGNOSIS — N184 Chronic kidney disease, stage 4 (severe): Secondary | ICD-10-CM

## 2021-08-24 LAB — IRON AND TIBC
Iron: 35 ug/dL (ref 28–170)
Saturation Ratios: 8 % — ABNORMAL LOW (ref 10.4–31.8)
TIBC: 452 ug/dL — ABNORMAL HIGH (ref 250–450)
UIBC: 417 ug/dL

## 2021-08-24 LAB — FERRITIN: Ferritin: 24 ng/mL (ref 11–307)

## 2021-08-24 LAB — POCT HEMOGLOBIN-HEMACUE: Hemoglobin: 10.3 g/dL — ABNORMAL LOW (ref 12.0–15.0)

## 2021-08-24 MED ORDER — EPOETIN ALFA-EPBX 40000 UNIT/ML IJ SOLN
60000.0000 [IU] | INTRAMUSCULAR | Status: DC
  Administered 2021-08-24: 11:00:00 60000 [IU] via SUBCUTANEOUS

## 2021-08-24 MED ORDER — EPOETIN ALFA-EPBX 40000 UNIT/ML IJ SOLN
INTRAMUSCULAR | Status: AC
  Filled 2021-08-24: qty 2

## 2021-09-07 ENCOUNTER — Encounter (HOSPITAL_COMMUNITY)
Admission: RE | Admit: 2021-09-07 | Discharge: 2021-09-07 | Disposition: A | Discharge status: Home / Self Care | Payer: 59 | Source: Ambulatory Visit | Attending: Internal Medicine | Admitting: Internal Medicine

## 2021-09-07 VITALS — BP 132/62 | HR 83 | Temp 97.4°F | Resp 18

## 2021-09-07 DIAGNOSIS — N185 Chronic kidney disease, stage 5: Secondary | ICD-10-CM | POA: Insufficient documentation

## 2021-09-07 DIAGNOSIS — D631 Anemia in chronic kidney disease: Secondary | ICD-10-CM | POA: Insufficient documentation

## 2021-09-07 DIAGNOSIS — N184 Chronic kidney disease, stage 4 (severe): Secondary | ICD-10-CM | POA: Diagnosis present

## 2021-09-07 DIAGNOSIS — N179 Acute kidney failure, unspecified: Secondary | ICD-10-CM | POA: Insufficient documentation

## 2021-09-07 LAB — RENAL FUNCTION PANEL
Albumin: 3.2 g/dL — ABNORMAL LOW (ref 3.5–5.0)
Anion gap: 15 (ref 5–15)
BUN: 176 mg/dL — ABNORMAL HIGH (ref 8–23)
CO2: 30 mmol/L (ref 22–32)
Calcium: 8.8 mg/dL — ABNORMAL LOW (ref 8.9–10.3)
Chloride: 95 mmol/L — ABNORMAL LOW (ref 98–111)
Creatinine, Ser: 3.55 mg/dL — ABNORMAL HIGH (ref 0.44–1.00)
GFR, Estimated: 13 mL/min — ABNORMAL LOW (ref >60)
Glucose, Bld: 119 mg/dL — ABNORMAL HIGH (ref 70–99)
Phosphorus: 6.3 mg/dL — ABNORMAL HIGH (ref 2.5–4.6)
Potassium: 3.3 mmol/L — ABNORMAL LOW (ref 3.5–5.1)
Sodium: 140 mmol/L (ref 135–145)

## 2021-09-07 LAB — IRON AND TIBC
Iron: 32 ug/dL (ref 28–170)
Saturation Ratios: 7 % — ABNORMAL LOW (ref 10.4–31.8)
TIBC: 428 ug/dL (ref 250–450)
UIBC: 396 ug/dL

## 2021-09-07 LAB — FERRITIN: Ferritin: 11 ng/mL (ref 11–307)

## 2021-09-07 LAB — POCT HEMOGLOBIN-HEMACUE: Hemoglobin: 10 g/dL — ABNORMAL LOW (ref 12.0–15.0)

## 2021-09-07 LAB — MAGNESIUM: Magnesium: 1.5 mg/dL — ABNORMAL LOW (ref 1.7–2.4)

## 2021-09-07 MED ORDER — EPOETIN ALFA 20000 UNIT/ML IJ SOLN
INTRAMUSCULAR | Status: AC
  Filled 2021-09-07: qty 1

## 2021-09-07 MED ORDER — EPOETIN ALFA-EPBX 10000 UNIT/ML IJ SOLN
INTRAMUSCULAR | Status: AC
  Administered 2021-09-07: 20000 [IU]
  Filled 2021-09-07: qty 2

## 2021-09-07 MED ORDER — EPOETIN ALFA-EPBX 40000 UNIT/ML IJ SOLN: 60000.0000 [IU] | INTRAMUSCULAR | Status: DC

## 2021-09-07 MED ORDER — EPOETIN ALFA 40000 UNIT/ML IJ SOLN
INTRAMUSCULAR | Status: AC
  Filled 2021-09-07: qty 1

## 2021-09-07 MED ORDER — EPOETIN ALFA-EPBX 40000 UNIT/ML IJ SOLN
INTRAMUSCULAR | Status: AC
  Administered 2021-09-07: 40000 [IU]
  Filled 2021-09-07: qty 1

## 2021-09-08 LAB — PTH, INTACT AND CALCIUM
Calcium, Total (PTH): 9.1 mg/dL (ref 8.7–10.3)
PTH: 150 pg/mL — ABNORMAL HIGH (ref 15–65)

## 2021-09-20 ENCOUNTER — Other Ambulatory Visit (HOSPITAL_COMMUNITY): Payer: Self-pay | Admitting: *Deleted

## 2021-09-21 ENCOUNTER — Inpatient Hospital Stay (HOSPITAL_COMMUNITY): Admission: RE | Admit: 2021-09-21 | Payer: 59 | Source: Ambulatory Visit

## 2021-10-04 ENCOUNTER — Encounter (HOSPITAL_COMMUNITY)
Admission: RE | Admit: 2021-10-04 | Discharge: 2021-10-04 | Disposition: A | Discharge status: Home / Self Care | Payer: 59 | Source: Ambulatory Visit | Attending: Internal Medicine | Admitting: Internal Medicine

## 2021-10-04 VITALS — BP 159/70 | HR 97 | Temp 97.2°F | Resp 18 | Wt 198.0 lb

## 2021-10-04 DIAGNOSIS — N179 Acute kidney failure, unspecified: Secondary | ICD-10-CM

## 2021-10-04 DIAGNOSIS — N184 Chronic kidney disease, stage 4 (severe): Secondary | ICD-10-CM

## 2021-10-04 LAB — POCT HEMOGLOBIN-HEMACUE: Hemoglobin: 9 g/dL — ABNORMAL LOW (ref 12.0–15.0)

## 2021-10-04 MED ORDER — EPOETIN ALFA-EPBX 40000 UNIT/ML IJ SOLN
INTRAMUSCULAR | Status: AC
  Administered 2021-10-04: 11:00:00 60000 [IU] via SUBCUTANEOUS
  Filled 2021-10-04: qty 2

## 2021-10-04 MED ORDER — EPOETIN ALFA-EPBX 40000 UNIT/ML IJ SOLN: 60000.0000 [IU] | INTRAMUSCULAR | Status: DC

## 2021-10-04 MED ORDER — SODIUM CHLORIDE 0.9 % IV SOLN
510.0000 mg | INTRAVENOUS | Status: DC
  Administered 2021-10-04: 12:00:00 510 mg via INTRAVENOUS
  Filled 2021-10-04: qty 510

## 2021-10-05 ENCOUNTER — Encounter (HOSPITAL_COMMUNITY): Payer: 59

## 2021-10-18 ENCOUNTER — Encounter (HOSPITAL_COMMUNITY): Payer: 59

## 2021-10-20 ENCOUNTER — Encounter (HOSPITAL_BASED_OUTPATIENT_CLINIC_OR_DEPARTMENT_OTHER): Payer: 59 | Attending: Internal Medicine | Admitting: Internal Medicine

## 2021-10-20 ENCOUNTER — Other Ambulatory Visit: Payer: Self-pay

## 2021-10-20 DIAGNOSIS — Z794 Long term (current) use of insulin: Secondary | ICD-10-CM | POA: Diagnosis not present

## 2021-10-20 DIAGNOSIS — I509 Heart failure, unspecified: Secondary | ICD-10-CM | POA: Diagnosis not present

## 2021-10-20 DIAGNOSIS — E1151 Type 2 diabetes mellitus with diabetic peripheral angiopathy without gangrene: Secondary | ICD-10-CM | POA: Insufficient documentation

## 2021-10-20 DIAGNOSIS — I739 Peripheral vascular disease, unspecified: Secondary | ICD-10-CM | POA: Diagnosis not present

## 2021-10-20 DIAGNOSIS — L97822 Non-pressure chronic ulcer of other part of left lower leg with fat layer exposed: Secondary | ICD-10-CM | POA: Diagnosis present

## 2021-10-20 DIAGNOSIS — I130 Hypertensive heart and chronic kidney disease with heart failure and stage 1 through stage 4 chronic kidney disease, or unspecified chronic kidney disease: Secondary | ICD-10-CM | POA: Insufficient documentation

## 2021-10-20 DIAGNOSIS — Z86718 Personal history of other venous thrombosis and embolism: Secondary | ICD-10-CM | POA: Insufficient documentation

## 2021-10-20 DIAGNOSIS — N184 Chronic kidney disease, stage 4 (severe): Secondary | ICD-10-CM | POA: Insufficient documentation

## 2021-10-20 DIAGNOSIS — F17200 Nicotine dependence, unspecified, uncomplicated: Secondary | ICD-10-CM | POA: Insufficient documentation

## 2021-10-20 DIAGNOSIS — E11622 Type 2 diabetes mellitus with other skin ulcer: Secondary | ICD-10-CM | POA: Diagnosis not present

## 2021-10-20 DIAGNOSIS — I872 Venous insufficiency (chronic) (peripheral): Secondary | ICD-10-CM | POA: Insufficient documentation

## 2021-10-20 DIAGNOSIS — E11621 Type 2 diabetes mellitus with foot ulcer: Secondary | ICD-10-CM | POA: Insufficient documentation

## 2021-10-20 DIAGNOSIS — J449 Chronic obstructive pulmonary disease, unspecified: Secondary | ICD-10-CM | POA: Insufficient documentation

## 2021-10-20 DIAGNOSIS — E1122 Type 2 diabetes mellitus with diabetic chronic kidney disease: Secondary | ICD-10-CM | POA: Insufficient documentation

## 2021-10-20 DIAGNOSIS — I890 Lymphedema, not elsewhere classified: Secondary | ICD-10-CM | POA: Diagnosis not present

## 2021-10-24 NOTE — Progress Notes (Signed)
[name] [name], [name] [name] ([id number]) Visit Report for 10/20/2021 Abuse/Suicide Risk Screen Details Patient Name: Date of Service: PA [name] [name] 10/20/2021 7:30 A M Medical Record Number: [id number] Patient Account Number: [id number] Date of Birth/Sex: Treating RN: September 11, 1951 (71 y.o. [name] [name] Primary Care Davieon Stockham: [name] Other Clinician: Referring Rafael Quesada: Treating Kuuipo Anzaldo/Extender: Marin Olp in Treatment: 0 Abuse/Suicide Risk Screen Items Answer ABUSE RISK SCREEN: Has anyone close to you tried to hurt or harm you recentlyo No Do you feel uncomfortable with anyone in your familyo No Has anyone forced you do things that you didnt want to doo No Electronic Signature(s) Signed: 10/24/2021 11:38:13 AM By: Sharyn Creamer RN, BSN Entered By: Sharyn Creamer on 10/20/2021 08:31:26 -------------------------------------------------------------------------------- Activities of Daily Living Details Patient Name: Date of Service: [name], [name] 10/20/2021 7:30 A M Medical Record Number: [id number] Patient Account Number: [id number] Date of Birth/Sex: Treating RN: January 05, 1951 (71 y.o. [name] [name] Primary Care Aadon Gorelik: [name] Other Clinician: Referring Zineb Glade: Treating Hebert Dooling/Extender: Marin Olp in Treatment: 0 Activities of Daily Living Items Answer Activities of Daily Living (Please select one for each item) Drive Automobile Not Able T Medications ake Need Assistance Use T elephone Completely Able Care for Appearance Need Assistance Use T oilet Need Assistance Bath / Shower Need Assistance Dress Self Need Assistance Feed Self Need Assistance Walk Need Assistance Get In / Out Bed Need Assistance Housework Need Assistance Prepare Meals Need Assistance Handle Money Need Assistance Shop for Self Need Assistance Electronic Signature(s) Signed: 10/24/2021 11:38:13 AM By: Sharyn Creamer RN,  BSN Entered By: Sharyn Creamer on 10/20/2021 08:32:30 -------------------------------------------------------------------------------- Education Screening Details Patient Name: Date of Service: [name], [name] 10/20/2021 7:30 A M Medical Record Number: [id number] Patient Account Number: [id number] Date of Birth/Sex: Treating RN: 02/06/1951 (71 y.o. [name] [name] Primary Care Dacota Devall: [name] Other Clinician: Referring Verlyn Dannenberg: Treating Bowyn Mercier/Extender: Marin Olp in Treatment: 0 Learning Preferences/Education Level/Primary Language Highest Education Level: College or Above Preferred Language: English Cognitive Barrier Language Barrier: No Translator Needed: No Memory Deficit: No Emotional Barrier: No Cultural/Religious Beliefs Affecting Medical Care: No Physical Barrier Impaired Vision: No Impaired Hearing: No Decreased Hand dexterity: Yes Knowledge/Comprehension Knowledge Level: Medium Comprehension Level: Medium Ability to understand written instructions: Medium Ability to understand verbal instructions: Medium Motivation Anxiety Level: Calm Cooperation: Cooperative Education Importance: Acknowledges Need Interest in Health Problems: Asks Questions Perception: Coherent Willingness to Engage in Self-Management Medium Activities: Readiness to Engage in Self-Management Medium Activities: Electronic Signature(s) Signed: 10/24/2021 11:38:13 AM By: Sharyn Creamer RN, BSN Entered By: Sharyn Creamer on 10/20/2021 08:34:12 -------------------------------------------------------------------------------- Fall Risk Assessment Details Patient Name: Date of Service: [name], [name] 10/20/2021 7:30 A M Medical Record Number: [id number] Patient Account Number: [id number] Date of Birth/Sex: Treating RN: July 14, 1951 (71 y.o. [name] [name] Primary Care Sanaii Caporaso: [name] Other Clinician: Referring Andrianna Manalang: Treating  Natalin Bible/Extender: Marin Olp in Treatment: 0 Fall Risk Assessment Items Have you had 2 or more falls in the last 12 monthso 0 Yes Have you had any fall that resulted in injury in the last 12 monthso 0 Yes FALLS RISK SCREEN History of falling - immediate or within 3 months 25 Yes Secondary diagnosis (Do you have 2 or more medical diagnoseso) 15 Yes Ambulatory aid None/bed rest/wheelchair/nurse 0 Yes Crutches/cane/walker 0 No Furniture 0 No Intravenous therapy Access/Saline/Heparin Lock 0 No Gait/Transferring Normal/ bed rest/ wheelchair 0 Yes Weak (short steps with or without shuffle, stooped but  able to lift head while walking, may seek 10 Yes support from furniture) Impaired (short steps with shuffle, may have difficulty arising from chair, head down, impaired 20 Yes balance) Mental Status Oriented to own ability 0 Yes Electronic Signature(s) Signed: 10/24/2021 11:38:13 AM By: Sharyn Creamer RN, BSN Entered By: Sharyn Creamer on 10/20/2021 08:36:04 -------------------------------------------------------------------------------- Foot Assessment Details Patient Name: Date of Service: [name], [name] 10/20/2021 7:30 A M Medical Record Number: [id number] Patient Account Number: [id number] Date of Birth/Sex: Treating RN: [birth year] (71 y.o. [name] [name] Primary Care Noreen Mackintosh: [name] Other Clinician: Referring Onika Gudiel: Treating Shahmeer Bunn/Extender: Marin Olp in Treatment: 0 Foot Assessment Items Site Locations + = Sensation present, - = Sensation absent, C = Callus, U = Ulcer R = Redness, W = Warmth, M = Maceration, PU = Pre-ulcerative lesion F = Fissure, S = Swelling, D = Dryness Assessment Right: Left: Other Deformity: No No Prior Foot Ulcer: No No Prior Amputation: No No Charcot Joint: No No Ambulatory Status: Ambulatory With Help Assistance Device: Wheelchair Gait: Actor) Signed: 10/24/2021 11:38:13 AM By: Sharyn Creamer RN, BSN Entered By: Sharyn Creamer on 10/20/2021 08:43:56 -------------------------------------------------------------------------------- Nutrition Risk Screening Details Patient Name: Date of Service: [name], [name] 10/20/2021 7:30 A M Medical Record Number: [id number] Patient Account Number: [id number] Date of Birth/Sex: Treating RN: 1951-03-29 (71 y.o. [name] [name] Primary Care Lavida Patch: [name] Other Clinician: Referring Anabelen Kaminsky: Treating Krist Rosenboom/Extender: Marin Olp in Treatment: 0 Height (in): 63 Weight (lbs): 191 Body Mass Index (BMI): 33.8 Nutrition Risk Screening Items Score Screening NUTRITION RISK SCREEN: I have an illness or condition that made me change the kind and/or amount of food I eat 2 Yes I eat fewer than two meals per day 3 Yes I eat few fruits and vegetables, or milk products 0 No I have three or more drinks of beer, liquor or wine almost every day 0 No I have tooth or mouth problems that make it hard for me to eat 0 No I don't always have enough money to buy the food I need 0 No I eat alone most of the time 1 Yes I take three or more different prescribed or over-the-counter drugs a day 1 Yes Without wanting to, I have lost or gained 10 pounds in the last six months 2 Yes I am not always physically able to shop, cook and/or feed myself 0 No Nutrition Protocols Good Risk Protocol Moderate Risk Protocol High Risk Proctocol 0 Provide education on nutrition Risk Level: High Risk Score: 9 Electronic Signature(s) Signed: 10/24/2021 11:38:13 AM By: Sharyn Creamer RN, BSN Entered By: Sharyn Creamer on 10/20/2021 08:41:02

## 2021-10-25 NOTE — Progress Notes (Signed)
[name] [name] ([id number]) Visit Report for 10/20/2021 Chief Complaint Document Details Patient Name: Date of Service: [name] [name] 10/20/2021 7:30 A M Medical Record Number: [id number] Patient Account Number: [id number] Date of Birth/Sex: Treating RN: Mar 16, 1951 (71 y.o. [name] [name] Primary Care Provider: [name] Other Clinician: Referring Provider: Treating Provider/Extender: Marin Olp in Treatment: 0 Information Obtained from: Patient Chief Complaint 10/20/2021; Left lower extremity wound due to trauma Electronic Signature(s) Signed: 10/20/2021 9:31:23 AM By: Kalman Shan DO Entered By: Kalman Shan on 10/20/2021 09:22:45 -------------------------------------------------------------------------------- Debridement Details Patient Name: Date of Service: [name] [name], [name] 10/20/2021 7:30 A M Medical Record Number: [id number] Patient Account Number: [id number] Date of Birth/Sex: Treating RN: 1950-12-04 (71 y.o. [name] [name] Primary Care Provider: [name] Other Clinician: Referring Provider: Treating Provider/Extender: Marin Olp in Treatment: 0 Debridement Performed for Assessment: Wound #1 Left,Medial Lower Leg Performed By: Clinician Levan Hurst, RN Debridement Type: Chemical/Enzymatic/Mechanical Agent Used: Santyl Severity of Tissue Pre Debridement: Fat layer exposed Level of Consciousness (Pre-procedure): Awake and Alert Pre-procedure Verification/Time Out No Taken: Start Time: 09:00 Bleeding: None Response to Treatment: Procedure was tolerated well Level of Consciousness (Post- Awake and Alert procedure): Post Debridement Measurements of Total Wound Length: (cm) 3.3 Width: (cm) 1.5 Depth: (cm) 0.2 Volume: (cm) 0.778 Character of Wound/Ulcer Post Debridement: Requires Further Debridement Severity of Tissue Post Debridement: Fat layer exposed Post Procedure  Diagnosis Same as Pre-procedure Electronic Signature(s) Signed: 10/23/2021 9:28:51 AM By: Kalman Shan DO Signed: 10/25/2021 5:10:09 PM By: Levan Hurst RN, BSN Signed: 10/25/2021 5:10:09 PM By: Levan Hurst RN, BSN Entered By: Levan Hurst on 10/20/2021 12:57:09 -------------------------------------------------------------------------------- HPI Details Patient Name: Date of Service: [name] [name], [name] 10/20/2021 7:30 A M Medical Record Number: [id number] Patient Account Number: [id number] Date of Birth/Sex: Treating RN: 15-May-1951 (71 y.o. [name] [name] Primary Care Provider: [name] Other Clinician: Referring Provider: Treating Provider/Extender: Marin Olp in Treatment: 0 History of Present Illness HPI Description: Admission 10/20/2021 Ms. [name] [name] is a 71 year old female with a past medical history of chronic kidney disease stage IV, type 2 diabetes on insulin with last hemoglobin A1c of 6.8, peripheral arterial disease, chronic venous insufficiency and lymphedema that presents to the clinic for a 5 to 56-month history of nonhealing ulcer to her left lower extremity. She states that she hit her leg against a walker and developed a wound. She has home health and they have been dressing this with silver alginate daily. She reports stability in the wound. She denies signs of infection. Electronic Signature(s) Signed: 10/20/2021 9:31:23 AM By: Kalman Shan DO Entered By: Kalman Shan on 10/20/2021 09:26:32 -------------------------------------------------------------------------------- Physical Exam Details Patient Name: Date of Service: [name] [name], [name] 10/20/2021 7:30 A M Medical Record Number: [id number] Patient Account Number: [id number] Date of Birth/Sex: Treating RN: 1951/08/18 (71 y.o. [name] [name] Primary Care Provider: [name] Other Clinician: Referring Provider: Treating Provider/Extender: Marin Olp in Treatment: 0 Constitutional respirations regular, non-labored and within target range for patient.. Cardiovascular 2+ dorsalis pedis/posterior tibialis pulses. Psychiatric pleasant and cooperative. Notes Left lower extremity: T the proximal medial aspect below the knee she has an open wound with granulation tissue and scant nonviable tissue. No signs of o surrounding infection. 2+ pitting edema to the knee. Electronic Signature(s) Signed: 10/20/2021 9:31:23 AM By: Kalman Shan DO Entered By: Kalman Shan on 10/20/2021 09:27:11 -------------------------------------------------------------------------------- Physician Orders Details Patient Name: Date of Service: [name] [name], [name] 10/20/2021 7:30 A M Medical Record Number: [id number] Patient Account Number: [id number] Date of Birth/Sex: Treating RN: May 09, 1951 (71 y.o. [name] [name] Primary Care Provider: [name] Other Clinician: Referring Provider: Treating Provider/Extender: Marin Olp in Treatment: 0 Verbal / Phone Orders: No Diagnosis Coding ICD-10 Coding Code Description [phone] Non-pressure chronic ulcer of other part of left lower leg with fat layer exposed N18.4 Chronic kidney disease, stage 4 (severe) I73.9 Peripheral vascular disease, unspecified I87.2 Venous insufficiency (chronic) (peripheral) I89.0 Lymphedema, not elsewhere classified E11.622 Type 2 diabetes mellitus with other skin ulcer Follow-up Appointments ppointment in 1 week. - Dr. Heber [place] Return A Bathing/ Shower/ Hygiene May shower and wash wound with soap and water. Home Health New wound care orders this week; continue Home Health for wound care. May utilize formulary equivalent dressing for wound treatment orders unless otherwise specified. - Change primary dressing to Hydrofera Blue Other Home Health Orders/Instructions: [facility] Wound Treatment Wound #1 - Lower Leg  Wound Laterality: Left, Medial Cleanser: Wound Cleanser (Home Health) 1 x Per Day/7 Days Discharge Instructions: Cleanse the wound with wound cleanser prior to applying a clean dressing using gauze sponges, not tissue or cotton balls. Peri-Wound Care: Skin Prep (Home Health) 1 x Per Day/7 Days Discharge Instructions: Use skin prep as directed Prim Dressing: Hydrofera Blue Classic Foam, 4x4 in (Home Health) 1 x Per Day/7 Days ary Discharge Instructions: Moisten with saline prior to applying to wound bed Prim Dressing: Santyl Ointment 1 x Per Day/7 Days ary Discharge Instructions: **IN CLINIC ONLY** Secondary Dressing: Bordered Gauze, 4x4 in ([place]) 1 x Per Day/7 Days Discharge Instructions: Apply over primary dressing as directed. Electronic Signature(s) Signed: 10/20/2021 9:31:23 AM By: Kalman Shan DO Entered By: Kalman Shan on 10/20/2021 09:27:47 -------------------------------------------------------------------------------- Problem List Details Patient Name: Date of Service: [name] [name], [name]. 10/20/2021 7:30 A M Medical Record Number: [id number] Patient Account Number: [id number] Date of Birth/Sex: Treating RN: 12-Oct-1950 (71 y.o. 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/13/2023 No Yes N18.4 Chronic kidney disease, stage 4 (severe) 10/20/2021 No Yes I73.9 Peripheral vascular disease, unspecified 10/20/2021 No Yes I87.2 Venous insufficiency (chronic) (peripheral) 10/20/2021 No Yes I89.0 Lymphedema, not elsewhere classified 10/20/2021 No Yes E11.622 Type 2 diabetes mellitus with other skin ulcer 10/20/2021 No Yes Inactive Problems Resolved Problems Electronic  Signature(s) Signed: 10/20/2021 9:31:23 AM By: Kalman Shan DO Entered By: Kalman Shan on 10/20/2021 09:22:07 -------------------------------------------------------------------------------- Progress Note Details Patient Name: Date of Service: [name] [name], Kyliegh P. 10/20/2021 7:30 A M Medical Record Number: [id number] Patient Account Number: [id number] Date of Birth/Sex: Treating RN: 08/12/1951 (71 y.o. [name] [name] Primary Care Provider: [name] Other Clinician: Referring Provider: Treating Provider/Extender: Marin Olp in Treatment: 0 Subjective Chief Complaint Information obtained from Patient 10/20/2021; Left lower extremity wound due to trauma History of Present Illness (HPI) Admission 10/20/2021 Ms. Matsuko Kretz is a 71 year old female with a past medical history of chronic kidney disease stage IV, type 2 diabetes on insulin with last hemoglobin A1c of 6.8, peripheral arterial disease, chronic venous insufficiency and lymphedema that presents to the clinic for a 5 to 106-month history of nonhealing ulcer to her left lower extremity. She states that she hit her leg against a walker and developed a  wound. She has home health and they have been dressing this with silver alginate daily. She reports stability in the wound. She denies signs of infection. Patient History Information obtained from Patient. Allergies nebivolol (Reaction: swelling), ACE Inhibitors (Reaction: swelling), morphine (Reaction: itching) Social History Current every day smoker - half pack daily, Marital Status - Separated, Alcohol Use - Never, Drug Use - Prior History - recreational when young, Caffeine Use - Daily - soda. Medical History Hematologic/Lymphatic Patient has history of Anemia Denies history of Hemophilia, Human Immunodeficiency Virus, Lymphedema, Sickle Cell Disease Respiratory Patient has history of Asthma, Chronic Obstructive Pulmonary Disease  (COPD) Denies history of Aspiration, Pneumothorax, Sleep Apnea, Tuberculosis Cardiovascular Patient has history of Congestive Heart Failure, Deep Vein Thrombosis - hx DVT Rt leg, Hypertension, Peripheral Arterial Disease Denies history of Angina Endocrine Patient has history of Type II Diabetes Musculoskeletal Patient has history of Gout, Osteoarthritis Patient is treated with Insulin. Blood sugar is tested. Medical A Surgical History Notes nd Gastrointestinal fatty liver Genitourinary CKD Review of Systems (ROS) Constitutional Symptoms (General Health) Denies complaints or symptoms of Fatigue, Fever, Chills, Marked Weight Change. Eyes Denies complaints or symptoms of Dry Eyes, Vision Changes, Glasses / Contacts. Ear/Nose/Mouth/Throat Denies complaints or symptoms of Chronic sinus problems or rhinitis. Respiratory Denies complaints or symptoms of Chronic or frequent coughs, Shortness of Breath. Cardiovascular Denies complaints or symptoms of Chest pain. Integumentary (Skin) Complains or has symptoms of Wounds, left lower leg Psychiatric Denies complaints or symptoms of Claustrophobia, Suicidal. Objective Constitutional respirations regular, non-labored and within target range for patient.. Vitals Time Taken: 8:11 AM, Height: 63 in, Source: Stated, Weight: 191 lbs, Source: Stated, BMI: 33.8, Temperature: 97.9 F, Pulse: 85 bpm, Respiratory Rate: 18 breaths/min, Blood Pressure: 121/62 mmHg, Capillary Blood Glucose: 200 mg/dl. General Notes: glucose per pt report Cardiovascular 2+ dorsalis pedis/posterior tibialis pulses. Psychiatric pleasant and cooperative. General Notes: Left lower extremity: T the proximal medial aspect below the knee she has an open wound with granulation tissue and scant nonviable tissue. o No signs of surrounding infection. 2+ pitting edema to the knee. Integumentary (Hair, Skin) Wound #1 status is Open. Original cause of wound was Trauma. The date  acquired was: 04/07/2021. The wound is located on the Left,Medial Lower Leg. The wound measures 3.3cm length x 1.5cm width x 0.2cm depth; 3.888cm^2 area and 0.778cm^3 volume. There is Fat Layer (Subcutaneous Tissue) exposed. There is no tunneling or undermining noted. There is a medium amount of serosanguineous drainage noted. The wound margin is flat and intact. There is large (67-100%) red, pink granulation within the wound bed. There is a small (1-33%) amount of necrotic tissue within the wound bed including Adherent Slough. Assessment Active Problems ICD-10 Non-pressure chronic ulcer of other part of left lower leg with fat layer exposed Chronic kidney disease, stage 4 (severe) Peripheral vascular disease, unspecified Venous insufficiency (chronic) (peripheral) Lymphedema, not elsewhere classified Type 2 diabetes mellitus with other skin ulcer Patient presents with a 58-month history of nonhealing wound to her left lower extremity due to trauma. Her comorbidities all contribute to her slow wound healing. At this time I recommended using Hydrofera Blue daily. We will add Santyl in office to help with further debridement. There are no signs of infection on exam. Her ABIs were 0.74 on the left. She denies claudication symptoms. She follows with vein and vascular. She also has home health. 46 minutes was spent on the encounter including face-to-face, EMR review and coordination of care. Procedures Wound #1 Pre-procedure diagnosis of  Wound #1 is a Venous Leg Ulcer located on the Left,Medial Lower Leg .Severity of Tissue Pre Debridement is: Fat layer exposed. There was a Chemical/Enzymatic/Mechanical debridement performed by Levan Hurst, RN.Marland Kitchen Agent used was [employer]. There was no bleeding. The procedure was tolerated well. Post Debridement Measurements: 3.3cm length x 1.5cm width x 0.2cm depth; 0.778cm^3 volume. Character of Wound/Ulcer Post Debridement requires further debridement. Severity of  Tissue Post Debridement is: Fat layer exposed. Post procedure Diagnosis Wound #1: Same as Pre-Procedure Plan Follow-up Appointments: Return Appointment in 1 week. - Dr. Heber [place] Bathing/ Shower/ Hygiene: May shower and wash wound with soap and water. Home Health: New wound care orders this week; continue Home Health for wound care. May utilize formulary equivalent dressing for wound treatment orders unless otherwise specified. - Change primary dressing to Hydrofera Blue Other Home Health Orders/Instructions: - [facility] WOUND #1: - Lower Leg Wound Laterality: Left, Medial Cleanser: Wound Cleanser (Home Health) 1 x Per Day/7 Days Discharge Instructions: Cleanse the wound with wound cleanser prior to applying a clean dressing using gauze sponges, not tissue or cotton balls. Peri-Wound Care: Skin Prep (Home Health) 1 x Per Day/7 Days Discharge Instructions: Use skin prep as directed Prim Dressing: Hydrofera Blue Classic Foam, 4x4 in (Home Health) 1 x Per Day/7 Days ary Discharge Instructions: Moisten with saline prior to applying to wound bed Prim Dressing: Santyl Ointment 1 x Per Day/7 Days ary Discharge Instructions: **IN CLINIC ONLY** Secondary Dressing: Bordered Gauze, 4x4 in ([place]) 1 x Per Day/7 Days Discharge Instructions: Apply over primary dressing as directed. 1. Hydrofera Blue daily 2. Follow-up in 1 week Electronic Signature(s) Signed: 10/23/2021 9:28:51 AM By: Kalman Shan DO Signed: 10/25/2021 5:10:09 PM By: Levan Hurst RN, BSN Previous Signature: 10/20/2021 9:31:23 AM Version By: Kalman Shan DO Entered By: Levan Hurst on 10/20/2021 12:57:27 -------------------------------------------------------------------------------- HxROS Details Patient Name: Date of Service: [name] [name], [place] P. 10/20/2021 7:30 A M Medical Record Number: [id number] Patient Account Number: [id number] Date of Birth/Sex: Treating RN: [birth year] (71 y.o. [name] [name] Primary Care  Provider: [name] Other Clinician: Referring Provider: Treating Provider/Extender: Marin Olp in Treatment: 0 Information Obtained From Patient Constitutional Symptoms (General Health) Complaints and Symptoms: Negative for: Fatigue; Fever; Chills; Marked Weight Change Eyes Complaints and Symptoms: Negative for: Dry Eyes; Vision Changes; Glasses / Contacts Ear/Nose/Mouth/Throat Complaints and Symptoms: Negative for: Chronic sinus problems or rhinitis Respiratory Complaints and Symptoms: Negative for: Chronic or frequent coughs; Shortness of Breath Medical History: Positive for: Asthma; Chronic Obstructive Pulmonary Disease (COPD) Negative for: Aspiration; Pneumothorax; Sleep Apnea; Tuberculosis Cardiovascular Complaints and Symptoms: Negative for: Chest pain Medical History: Positive for: Congestive Heart Failure; Deep Vein Thrombosis - hx DVT Rt leg; Hypertension; Peripheral Arterial Disease Negative for: Angina Integumentary (Skin) Complaints and Symptoms: Positive for: Wounds Review of System Notes: left lower leg Psychiatric Complaints and Symptoms: Negative for: Claustrophobia; Suicidal Hematologic/Lymphatic Medical History: Positive for: Anemia Negative for: Hemophilia; Human Immunodeficiency Virus; Lymphedema; Sickle Cell Disease Gastrointestinal Medical History: Past Medical History Notes: fatty liver Endocrine Medical History: Positive for: Type II Diabetes Time with diabetes: 10+yrs Treated with: Insulin Blood sugar tested every day: Yes Tested : 2 Genitourinary Medical History: Past Medical History Notes: CKD Immunological Musculoskeletal Medical History: Positive for: Gout; Osteoarthritis Oncologic Immunizations Pneumococcal Vaccine: Received Pneumococcal Vaccination: No Implantable Devices None Family and Social History Current every day smoker - half pack daily; Marital Status - Separated; Alcohol Use:  Never; Drug Use: Prior History - recreational when young; Caffeine Use:  Daily - soda; Financial Concerns: No; Food, Clothing or Shelter Needs: No; Support System Lacking: No; Transportation Concerns: No Electronic Signature(s) Signed: 10/20/2021 9:31:23 AM By: Kalman Shan DO Signed: 10/24/2021 11:38:13 AM By: Sharyn Creamer RN, BSN Entered By: Sharyn Creamer on 10/20/2021 08:30:58 -------------------------------------------------------------------------------- [place] Details Patient Name: Date of Service: [name] [name], Malyiah P. 10/20/2021 Medical Record Number: [id number] Patient Account Number: [id number] Date of Birth/Sex: Treating RN: April 26, 1951 (71 y.o. [name] [name] Primary Care Provider: [name] Other Clinician: Referring Provider: Treating Provider/Extender: Marin Olp in Treatment: 0 Diagnosis Coding ICD-10 Codes Code Description [phone] Non-pressure chronic ulcer of other part of left lower leg with fat layer exposed N18.4 Chronic kidney disease, stage 4 (severe) I73.9 Peripheral vascular disease, unspecified I87.2 Venous insufficiency (chronic) (peripheral) I89.0 Lymphedema, not elsewhere classified E11.622 Type 2 diabetes mellitus with other skin ulcer Facility Procedures CPT4 Code: [id number] Description: 15176 - WOUND CARE VISIT-LEV 3 EST PT Modifier: 25 Quantity: 1 CPT4 Code: 16073710 Description: 62694 - DEBRIDE W/O ANES NON SELECT Modifier: Quantity: 1 Physician Procedures : CPT4 Code Description Modifier [id number] - WC PHYS LEVEL 4 - NEW PT ICD-10 Diagnosis Description L97.822 Non-pressure chronic ulcer of other part of left lower leg with fat layer exposed I73.9 Peripheral vascular disease, unspecified I87.2 Venous  insufficiency (chronic) (peripheral) E11.622 Type 2 diabetes mellitus with other skin ulcer Quantity: 1 Electronic Signature(s) Signed: 10/23/2021 9:28:51 AM By: Kalman Shan DO Signed: 10/25/2021  5:10:09 PM By: Levan Hurst RN, BSN Previous Signature: 10/20/2021 9:31:23 AM Version By: Kalman Shan DO Entered By: Levan Hurst on 10/20/2021 12:58:29

## 2021-10-25 NOTE — Progress Notes (Signed)
[name] [name], [name] [name] ([id number]) Visit Report for 10/20/2021 Allergy List Details Patient Name: Date of Service: [name] [name] [name], [name] [name]. 10/20/2021 7:30 A M Medical Record Number: [id number] Patient Account Number: [id number] Date of Birth/Sex: Treating RN: 12-Aug-1951 (71 y.o. [name] [name] Primary Care Carver Murakami: [name] Other Clinician: Referring Geronimo Diliberto: Treating Shamikia Linskey/Extender: Marin Olp in Treatment: 0 Allergies Active Allergies nebivolol Reaction: swelling ACE Inhibitors Reaction: swelling morphine Reaction: itching Allergy Notes Electronic Signature(s) Signed: 10/24/2021 11:38:13 AM By: Sharyn Creamer RN, BSN Entered By: Sharyn Creamer on 10/20/2021 08:16:49 -------------------------------------------------------------------------------- Arrival Information Details Patient Name: Date of Service: [name] [name] [name], [name] [name]. 10/20/2021 7:30 A M Medical Record Number: [id number] Patient Account Number: [id number] Date of Birth/Sex: Treating RN: [birth year] (71 y.o. [name] [name] Primary Care Raynelle Fujikawa: [name] Other Clinician: Referring Miata Culbreth: Treating Jaquasha Carnevale/Extender: Marin Olp in Treatment: 0 Visit Information Patient Arrived: Wheel Chair Arrival Time: 08:02 Accompanied By: sister Transfer Assistance: Manual Patient Identification Verified: Yes Secondary Verification Process Completed: No Patient Requires Transmission-Based Precautions: No Patient Has Alerts: Yes Patient Alerts: Patient on Blood Thinner Electronic Signature(s) Signed: 10/24/2021 11:38:13 AM By: Sharyn Creamer RN, BSN Entered By: Sharyn Creamer on 10/20/2021 08:11:48 -------------------------------------------------------------------------------- Clinic Level of Care Assessment Details Patient Name: Date of Service: [name] [name] [name], [name] [name]. 10/20/2021 7:30 A M Medical Record Number: [id number] Patient Account Number: [id number] Date of  Birth/Sex: Treating RN: 02-06-1951 (71 y.o. Nancy Fetter Primary Care Starnisha Batrez: [name] Other Clinician: Referring Morgann Woodburn: Treating Shawnelle Spoerl/Extender: Marin Olp in Treatment: 0 Clinic Level of Care Assessment Items TOOL 1 Quantity Score X- 1 0 Use when EandM and Procedure is performed on INITIAL visit ASSESSMENTS - Nursing Assessment / Reassessment X- 1 20 General Physical Exam (combine w/ comprehensive assessment (listed just below) when performed on new pt. evals) X- 1 25 Comprehensive Assessment (HX, ROS, Risk Assessments, Wounds Hx, etc.) ASSESSMENTS - Wound and Skin Assessment / Reassessment []  - 0 Dermatologic / Skin Assessment (not related to wound area) ASSESSMENTS - Ostomy and/or Continence Assessment and Care []  - 0 Incontinence Assessment and Management []  - 0 Ostomy Care Assessment and Management (repouching, etc.) PROCESS - Coordination of Care X - Simple Patient / Family Education for ongoing care 1 15 []  - 0 Complex (extensive) Patient / Family Education for ongoing care X- 1 10 Staff obtains Programmer, systems, Records, T Results / Process Orders est X- 1 10 Staff telephones HHA, Nursing Homes / Clarify orders / etc []  - 0 Routine Transfer to another Facility (non-emergent condition) []  - 0 Routine Hospital Admission (non-emergent condition) X- 1 15 New Admissions / Biomedical engineer / Ordering NPWT Apligraf, etc. , []  - 0 Emergency Hospital Admission (emergent condition) PROCESS - Special Needs []  - 0 Pediatric / Minor Patient Management []  - 0 Isolation Patient Management []  - 0 Hearing / Language / Visual special needs []  - 0 Assessment of Community assistance (transportation, D/C planning, etc.) []  - 0 Additional assistance / Altered mentation []  - 0 Support Surface(s) Assessment (bed, cushion, seat, etc.) INTERVENTIONS - Miscellaneous []  - 0 External ear exam []  - 0 Patient Transfer (multiple staff /  Civil Service fast streamer / Similar devices) []  - 0 Simple Staple / Suture removal (25 or less) []  - 0 Complex Staple / Suture removal (26 or more) []  - 0 Hypo/Hyperglycemic Management (do not check if billed separately) X- 1 15 Ankle / Brachial Index (ABI) - do not check if billed separately Has the patient been seen at  the hospital within the last three years: Yes Total Score: 110 Level Of Care: New/Established - Level 3 Electronic Signature(s) Signed: 10/25/2021 5:10:09 PM By: Levan Hurst RN, BSN Signed: 10/25/2021 5:10:09 PM By: Levan Hurst RN, BSN Entered By: Levan Hurst on 10/20/2021 12:58:10 -------------------------------------------------------------------------------- Encounter Discharge Information Details Patient Name: Date of Service: [name] [name] [name], [name] [name]. 10/20/2021 7:30 A M Medical Record Number: [id number] Patient Account Number: [id number] Date of Birth/Sex: Treating RN: January 29, 1951 (71 y.o. Nancy Fetter Primary Care Jamesyn Lindell: [name] Other Clinician: Referring Ellene Bloodsaw: Treating Lorilyn Laitinen/Extender: Marin Olp in Treatment: 0 Encounter Discharge Information Items Post Procedure Vitals Discharge Condition: Stable Temperature (F): 97.9 Ambulatory Status: Wheelchair Pulse (bpm): 85 Discharge Destination: Home Respiratory Rate (breaths/min): 18 Transportation: Private Auto Blood Pressure (mmHg): 121/62 Accompanied By: sister Schedule Follow-up Appointment: Yes Clinical Summary of Care: Patient Declined Electronic Signature(s) Signed: 10/25/2021 5:10:09 PM By: Levan Hurst RN, BSN Entered By: Levan Hurst on 10/20/2021 12:59:37 -------------------------------------------------------------------------------- Lower Extremity Assessment Details Patient Name: Date of Service: [name] [name] [name], [name] [name]. 10/20/2021 7:30 A M Medical Record Number: [id number] Patient Account Number: [id number] Date of Birth/Sex: Treating RN: [birth year] (71  y.o. [name] [name] Primary Care Luv Mish: [name] Other Clinician: Referring Santos Sollenberger: Treating Endy Easterly/Extender: Marin Olp in Treatment: 0 Edema Assessment Assessed: Shirlyn Goltz: No] [Right: No] E[Left: dema] [Right: :] Calf Left: Right: Point of Measurement: 31 cm From Medial Instep 33.5 cm Ankle Left: Right: Point of Measurement: 10 cm From Medial Instep 28 cm Knee To Floor Left: Right: From Medial Instep 38 cm Vascular Assessment Pulses: Dorsalis Pedis Palpable: [Left:No] Blood Pressure: Brachial: [Left:121] Ankle: [Left:Dorsalis Pedis: 90 0.74] Electronic Signature(s) Signed: 10/24/2021 11:38:13 AM By: Sharyn Creamer RN, BSN Entered By: Sharyn Creamer on 10/20/2021 08:51:08 -------------------------------------------------------------------------------- Multi Wound Chart Details Patient Name: Date of Service: [name] [name] [name], [name] [name]. 10/20/2021 7:30 A M Medical Record Number: [id number] Patient Account Number: [id number] Date of Birth/Sex: Treating RN: 1951/06/03 (71 y.o. Elam Dutch Primary Care Jalaina Salyers: [name] Other Clinician: Referring Chase Knebel: Treating Lauris Keepers/Extender: Marin Olp in Treatment: 0 Vital Signs Height(in): 63 Capillary Blood Glucose(mg/dl): 200 Weight(lbs): 191 Pulse(bpm): 83 Body Mass Index(BMI): 34 Blood Pressure(mmHg): 121/62 Temperature(F): 97.9 Respiratory Rate(breaths/min): 18 Photos: [N/A:N/A] Left, Medial Lower Leg N/A N/A Wound Location: Trauma N/A N/A Wounding Event: Venous Leg Ulcer N/A N/A Primary Etiology: Anemia, Asthma, Chronic Obstructive N/A N/A Comorbid History: Pulmonary Disease (COPD), Congestive Heart Failure, Deep Vein Thrombosis, Hypertension, Peripheral Arterial Disease, Type II Diabetes, Gout, Osteoarthritis 04/07/2021 N/A N/A Date Acquired: 0 N/A N/A Weeks of Treatment: Open N/A N/A Wound Status: 3.3x1.5x0.2 N/A  N/A Measurements L x W x D (cm) 3.888 N/A N/A A (cm) : rea 0.778 N/A N/A Volume (cm) : 0.00% N/A N/A % Reduction in Area: 0.00% N/A N/A % Reduction in Volume: Full Thickness Without Exposed N/A N/A Classification: Support Structures Medium N/A N/A Exudate Amount: Serosanguineous N/A N/A Exudate Type: red, brown N/A N/A Exudate Color: Flat and Intact N/A N/A Wound Margin: Large (67-100%) N/A N/A Granulation Amount: Red, Pink N/A N/A Granulation Quality: Small (1-33%) N/A N/A Necrotic Amount: Fat Layer (Subcutaneous Tissue): Yes N/A N/A Exposed Structures: Fascia: No Tendon: No Muscle: No Joint: No Bone: No None N/A N/A Epithelialization: Treatment Notes Electronic Signature(s) Signed: 10/20/2021 9:31:23 AM By: Kalman Shan DO Signed: 10/24/2021 11:39:47 AM By: Baruch Gouty RN, BSN Entered By: Kalman Shan on 10/20/2021 09:22:15 -------------------------------------------------------------------------------- Multi-Disciplinary Care Plan Details Patient Name: Date of Service: [name] [name] [name], [name] [name].  10/20/2021 7:30 A M Medical Record Number: [id number] Patient Account Number: [id number] Date of Birth/Sex: Treating RN: May 25, 1951 (71 y.o. Nancy Fetter Primary Care Sire Poet: [name] Other Clinician: Referring Eliyanah Elgersma: Treating Marleta Lapierre/Extender: Marin Olp in Treatment: 0 Multidisciplinary Care Plan reviewed with physician Active Inactive Abuse / Safety / Falls / Self Care Management Nursing Diagnoses: Potential for falls Potential for injury related to falls Goals: Patient will not experience any injury related to falls Date Initiated: 10/20/2021 Target Resolution Date: 11/17/2021 Goal Status: Active Patient/caregiver will verbalize/demonstrate measures taken to prevent injury and/or falls Date Initiated: 10/20/2021 Target Resolution Date: 11/17/2021 Goal Status: Active Interventions: Assess Activities of  Daily Living upon admission and as needed Assess fall risk on admission and as needed Assess: immobility, friction, shearing, incontinence upon admission and as needed Assess impairment of mobility on admission and as needed per policy Assess personal safety and home safety (as indicated) on admission and as needed Assess self care needs on admission and as needed Provide education on fall prevention Provide education on personal and home safety Notes: Nutrition Nursing Diagnoses: Impaired glucose control: actual or potential Potential for alteratiion in Nutrition/Potential for imbalanced nutrition Goals: Patient/caregiver agrees to and verbalizes understanding of need to use nutritional supplements and/or vitamins as prescribed Date Initiated: 10/20/2021 Target Resolution Date: 11/17/2021 Goal Status: Active Patient/caregiver will maintain therapeutic glucose control Date Initiated: 10/20/2021 Target Resolution Date: 11/17/2021 Goal Status: Active Interventions: Assess HgA1c results as ordered upon admission and as needed Assess patient nutrition upon admission and as needed per policy Provide education on elevated blood sugars and impact on wound healing Provide education on nutrition Notes: Wound/Skin Impairment Nursing Diagnoses: Impaired tissue integrity Knowledge deficit related to ulceration/compromised skin integrity Goals: Patient/caregiver will verbalize understanding of skin care regimen Date Initiated: 10/20/2021 Target Resolution Date: 11/17/2021 Goal Status: Active Ulcer/skin breakdown will have a volume reduction of 30% by week 4 Date Initiated: 10/20/2021 Target Resolution Date: 11/17/2021 Goal Status: Active Interventions: Assess patient/caregiver ability to obtain necessary supplies Assess patient/caregiver ability to perform ulcer/skin care regimen upon admission and as needed Assess ulceration(s) every visit Provide education on ulcer and skin  care Notes: Electronic Signature(s) Signed: 10/25/2021 5:10:09 PM By: Levan Hurst RN, BSN Entered By: Levan Hurst on 10/20/2021 12:53:08 -------------------------------------------------------------------------------- Pain Assessment Details Patient Name: Date of Service: [name] [name] [name], [name] [name]. 10/20/2021 7:30 A M Medical Record Number: [id number] Patient Account Number: [id number] Date of Birth/Sex: Treating RN: [birth year] (71 y.o. [name] [name] Primary Care Tarick Parenteau: [name] Other Clinician: Referring Priscella Donna: Treating Leyana Whidden/Extender: Marin Olp in Treatment: 0 Active Problems Location of Pain Severity and Description of Pain Patient Has Paino Yes Site Locations Pain Location: Generalized Pain Duration of the Pain. Constant / Intermittento Intermittent Rate the pain. Current Pain Level: 7 Character of Pain Describe the Pain: Aching, Throbbing Pain Management and Medication Current Pain Management: Medication: Yes Electronic Signature(s) Signed: 10/24/2021 11:38:13 AM By: Sharyn Creamer RN, BSN Entered By: Sharyn Creamer on 10/20/2021 08:59:03 -------------------------------------------------------------------------------- Patient/Caregiver Education Details Patient Name: Date of Service: [name] [name] [name], [name] [name]. 1/13/2023andnbsp7:30 A M Medical Record Number: [id number] Patient Account Number: [id number] Date of Birth/Gender: Treating RN: 1950-12-25 (71 y.o. Nancy Fetter Primary Care Physician: [name] Other Clinician: Referring Physician: Treating Physician/Extender: Marin Olp in Treatment: 0 Education Assessment Education Provided To: Patient Education Topics Provided Safety: Methods: Explain/Verbal Responses: State content correctly Wound/Skin Impairment: Methods: Explain/Verbal Responses: State content correctly Electronic Signature(s) Signed: 10/25/2021 5:10:09 PM By:  Levan Hurst RN, BSN Entered By: Levan Hurst on 10/20/2021 12:54:59 -------------------------------------------------------------------------------- Wound Assessment Details Patient Name: Date of Service: [name] [name], [name] [name] 10/20/2021 7:30 A M Medical Record Number: [id number] Patient Account Number: [id number] Date of Birth/Sex: Treating RN: 1951-08-09 (71 y.o. [name] [name] Primary Care Yakub Lodes: [name] Other Clinician: Referring Bita Cartwright: Treating Zyona Pettaway/Extender: Marin Olp in Treatment: 0 Wound Status Wound Number: 1 Primary Venous Leg Ulcer Etiology: Wound Location: Left, Medial Lower Leg Wound Open Wounding Event: Trauma Status: Date Acquired: 04/07/2021 Comorbid Anemia, Asthma, Chronic Obstructive Pulmonary Disease (COPD), Weeks Of Treatment: 0 History: Congestive Heart Failure, Deep Vein Thrombosis, Hypertension, Clustered Wound: No Peripheral Arterial Disease, Type II Diabetes, Gout, Osteoarthritis Photos Wound Measurements Length: (cm) 3.3 Width: (cm) 1.5 Depth: (cm) 0.2 Area: (cm) 3.888 Volume: (cm) 0.778 % Reduction in Area: 0% % Reduction in Volume: 0% Epithelialization: None Tunneling: No Undermining: No Wound Description Classification: Full Thickness Without Exposed Support Structures Wound Margin: Flat and Intact Exudate Amount: Medium Exudate Type: Serosanguineous Exudate Color: red, brown Foul Odor After Cleansing: No Slough/Fibrino Yes Wound Bed Granulation Amount: Large (67-100%) Exposed Structure Granulation Quality: Red, Pink Fascia Exposed: No Necrotic Amount: Small (1-33%) Fat Layer (Subcutaneous Tissue) Exposed: Yes Necrotic Quality: Adherent Slough Tendon Exposed: No Muscle Exposed: No Joint Exposed: No Bone Exposed: No Treatment Notes Wound #1 (Lower Leg) Wound Laterality: Left, Medial Cleanser Wound Cleanser Discharge Instruction: Cleanse the wound with wound cleanser prior to applying a  clean dressing using gauze sponges, not tissue or cotton balls. Peri-Wound Care Skin Prep Discharge Instruction: Use skin prep as directed Topical Primary Dressing Hydrofera Blue Classic Foam, 4x4 in Discharge Instruction: Moisten with saline prior to applying to wound bed Santyl Ointment Discharge Instruction: **IN CLINIC ONLY** Secondary Dressing Bordered Gauze, 4x4 in Discharge Instruction: Apply over primary dressing as directed. Secured With Compression Wrap Compression Stockings Environmental education officer) Signed: 10/24/2021 11:38:13 AM By: Sharyn Creamer RN, BSN Signed: 10/25/2021 5:10:09 PM By: Levan Hurst RN, BSN Entered By: Levan Hurst on 10/20/2021 08:57:16 -------------------------------------------------------------------------------- [place] Details Patient Name: Date of Service: [name] [name] [name], [name] [name]. 10/20/2021 7:30 A M Medical Record Number: [id number] Patient Account Number: [id number] Date of Birth/Sex: Treating RN: 01-24-1951 (71 y.o. [name] [name] Primary Care Lilibeth Opie: [name] Other Clinician: Referring Jendayi Berling: Treating Sajad Glander/Extender: Marin Olp in Treatment: 0 Vital Signs Time Taken: 08:11 Temperature (F): 97.9 Height (in): 63 Pulse (bpm): 85 Source: Stated Respiratory Rate (breaths/min): 18 Weight (lbs): 191 Blood Pressure (mmHg): 121/62 Source: Stated Capillary Blood Glucose (mg/dl): 200 Body Mass Index (BMI): 33.8 Reference Range: 80 - 120 mg / dl Notes glucose per pt report Electronic Signature(s) Signed: 10/24/2021 11:38:13 AM By: Sharyn Creamer RN, BSN Entered By: Sharyn Creamer on 10/20/2021 08:14:13

## 2021-10-30 ENCOUNTER — Encounter (HOSPITAL_BASED_OUTPATIENT_CLINIC_OR_DEPARTMENT_OTHER): Payer: 59 | Admitting: Internal Medicine

## 2021-10-30 ENCOUNTER — Other Ambulatory Visit: Payer: Self-pay

## 2021-10-30 DIAGNOSIS — L97822 Non-pressure chronic ulcer of other part of left lower leg with fat layer exposed: Secondary | ICD-10-CM

## 2021-10-30 NOTE — Progress Notes (Signed)
[name] [name], [name] [name] ([id number]) Visit Report for 10/30/2021 Chief Complaint Document Details Patient Name: Date of Service: [name] [name], [name] [name] 10/30/2021 12:45 PM Medical Record Number: [id number] Patient Account Number: [id number] Date of Birth/Sex: Treating RN: 07-23-1951 (71 y.o. [name] [name] Primary Care Provider: Nolene [name] Other Clinician: Referring Provider: Treating Provider/Extender: [name] [name] in Treatment: 1 Information Obtained from: Patient Chief Complaint 10/20/2021; Left lower extremity wound due to trauma Electronic Signature(s) Signed: 10/30/2021 2:19:36 PM By: [name] Shan DO Entered By: [name] [name] on 10/30/2021 14:13:07 -------------------------------------------------------------------------------- Debridement Details Patient Name: Date of Service: [name] [name], [name]. 10/30/2021 12:45 PM Medical Record Number: [id number] Patient Account Number: [id number] Date of Birth/Sex: Treating RN: November 03, 1950 (71 y.o. [name] [name], [name] [name] Primary Care Provider: Nolene [name] Other Clinician: Referring Provider: Treating Provider/Extender: [name] [name] in Treatment: 1 Debridement Performed for Assessment: Wound #1 Left,Medial Lower Leg Performed By: Physician [name] Shan, DO Debridement Type: Debridement Severity of Tissue Pre Debridement: Fat layer exposed Level of Consciousness (Pre-procedure): Awake and Alert Pre-procedure Verification/Time Out Yes - 14:00 Taken: Start Time: 14:01 Pain Control: Lidocaine 4% T opical Solution T Area Debrided (L x W): otal 2.8 (cm) x 1 (cm) = 2.8 (cm) Tissue and other material debrided: Viable, Non-Viable, Slough, Subcutaneous, Skin: Dermis , Skin: Epidermis, Fibrin/Exudate, Slough Level: Skin/Subcutaneous Tissue Debridement Description: Excisional Instrument: Curette Bleeding: Minimum Hemostasis Achieved: Pressure End Time: 14:06 Procedural Pain: 0 Post  Procedural Pain: 0 Response to Treatment: Procedure was tolerated well Level of Consciousness (Post- Awake and Alert procedure): Post Debridement Measurements of Total Wound Length: (cm) 2.8 Width: (cm) 1 Depth: (cm) 0.1 Volume: (cm) 0.22 Character of Wound/Ulcer Post Debridement: Improved Severity of Tissue Post Debridement: Fat layer exposed Post Procedure Diagnosis Same as Pre-procedure Electronic Signature(s) Signed: 10/30/2021 2:19:36 PM By: [name] Shan DO Signed: 10/30/2021 5:00:04 PM By: [name] Pilling RN, BSN Entered By: [name] [name] on 10/30/2021 14:07:08 -------------------------------------------------------------------------------- HPI Details Patient Name: Date of Service: [name] [name], [name]. 10/30/2021 12:45 PM Medical Record Number: [id number] Patient Account Number: [id number] Date of Birth/Sex: Treating RN: 12-09-1950 (71 y.o. [name] [name] Primary Care Provider: Nolene [name] Other Clinician: Referring Provider: Treating Provider/Extender: [name] [name] in Treatment: 1 History of Present Illness HPI Description: Admission 10/20/2021 Ms. [name] [name] is a 71 year old female with a past medical history of chronic kidney disease stage IV, type 2 diabetes on insulin with last hemoglobin A1c of 6.8, peripheral arterial disease, chronic venous insufficiency and lymphedema that presents to the clinic for a 5 to 3-month history of nonhealing ulcer to her left lower extremity. She states that she hit her leg against a walker and developed a wound. She has home health and they have been dressing this with silver alginate daily. She reports stability in the wound. She denies signs of infection. 1/23; patient presents for follow-up. She has been using Hydrofera Blue with dressing changes. She has no issues or complaints today. She denies signs of infection. Electronic Signature(s) Signed: 10/30/2021 2:19:36 PM By: [name] Shan DO Entered  By: [name] [name] on 10/30/2021 14:16:50 -------------------------------------------------------------------------------- Physical Exam Details Patient Name: Date of Service: [name] [name], [name]. 10/30/2021 12:45 PM Medical Record Number: [id number] Patient Account Number: [id number] Date of Birth/Sex: Treating RN: 1951-05-23 (71 y.o. [name] [name] Primary Care Provider: Nolene [name] Other Clinician: Referring Provider: Treating Provider/Extender: [name] [name] in Treatment: 1 Constitutional respirations regular, non-labored and within target range for patient.. Cardiovascular 2+ dorsalis pedis/posterior tibialis pulses.  Psychiatric pleasant and cooperative. Notes Left lower extremity: T the proximal medial aspect below the knee she has an open wound with granulation tissue and scant nonviable tissue. No signs of o surrounding infection. 2+ pitting edema to the knee. Electronic Signature(s) Signed: 10/30/2021 2:19:36 PM By: [name] Shan DO Entered By: [name] [name] on 10/30/2021 14:17:33 -------------------------------------------------------------------------------- Physician Orders Details Patient Name: Date of Service: [name] [name], [name]. 10/30/2021 12:45 PM Medical Record Number: [id number] Patient Account Number: [id number] Date of Birth/Sex: Treating RN: 1951-02-21 (71 y.o. [name] [name] Primary Care Provider: Nolene [name] Other Clinician: Referring Provider: Treating Provider/Extender: [name] [name] in Treatment: 1 Verbal / Phone Orders: No Diagnosis Coding Follow-up Appointments ppointment in 1 week. - [name] [name] Return A Bathing/ Shower/ Hygiene May shower and wash wound with soap and water. Edema Control - Lymphedema / SCD / Other Elevate legs to the level of the heart or above for 30 minutes daily and/or when sitting, a frequency of: - 3-4 times a day throughout the day. Avoid standing for long  periods of time. Moisturize legs daily. - every night before bed. Home Health No change in wound care orders this week; continue Home Health for wound care. May utilize formulary equivalent dressing for wound treatment orders unless otherwise specified. Other Home Health Orders/Instructions: - [facility] Wound Treatment Wound #1 - Lower Leg Wound Laterality: Left, Medial Cleanser: Wound Cleanser (Home Health) 1 x Per Day/7 Days Discharge Instructions: Cleanse the wound with wound cleanser prior to applying a clean dressing using gauze sponges, not tissue or cotton balls. Peri-Wound Care: Skin Prep (Home Health) 1 x Per Day/7 Days Discharge Instructions: Use skin prep as directed Prim Dressing: Hydrofera Blue Classic Foam, 4x4 in (Home Health) 1 x Per Day/7 Days ary Discharge Instructions: Moisten with saline prior to applying to wound bed Prim Dressing: Santyl Ointment 1 x Per Day/7 Days ary Discharge Instructions: **IN CLINIC ONLY** Secondary Dressing: Bordered Gauze, 4x4 in ([place]) 1 x Per Day/7 Days Discharge Instructions: Apply over primary dressing as directed. Electronic Signature(s) Signed: 10/30/2021 2:19:36 PM By: [name] Shan DO Entered By: [name] [name] on 10/30/2021 14:17:53 -------------------------------------------------------------------------------- Problem List Details Patient Name: Date of Service: [name] [name], [name]. 10/30/2021 12:45 PM Medical Record Number: [id number] Patient Account Number: [id number] Date of Birth/Sex: Treating RN: 12-Aug-1951 (71 y.o. 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/13/2023 No Yes N18.4 Chronic kidney disease, stage 4  (severe) 10/20/2021 No Yes I73.9 Peripheral vascular disease, unspecified 10/20/2021 No Yes I87.2 Venous insufficiency (chronic) (peripheral) 10/20/2021 No Yes I89.0 Lymphedema, not elsewhere classified 10/20/2021 No Yes E11.622 Type 2 diabetes mellitus with other skin ulcer 10/20/2021 No Yes Inactive Problems Resolved Problems Electronic Signature(s) Signed: 10/30/2021 2:19:36 PM By: [name] Shan DO Entered By: [name] [name] on 10/30/2021 14:12:45 -------------------------------------------------------------------------------- Progress Note Details Patient Name: Date of Service: [name] [name], [name]. 10/30/2021 12:45 PM Medical Record Number: [id number] Patient Account Number: [id number] Date of Birth/Sex: Treating RN: 06-17-1951 (71 y.o. [name] [name] Primary Care Provider: Nolene [name] Other Clinician: Referring Provider: Treating Provider/Extender: [name] [name] in Treatment: 1 Subjective Chief Complaint Information obtained from Patient 10/20/2021; Left lower extremity wound due to trauma History of Present Illness (HPI) Admission 10/20/2021 Ms. Marykathryn Carboni is a 71 year old female  with a past medical history of chronic kidney disease stage IV, type 2 diabetes on insulin with last hemoglobin A1c of 6.8, peripheral arterial disease, chronic venous insufficiency and lymphedema that presents to the clinic for a 5 to 77-month history of nonhealing ulcer to her left lower extremity. She states that she hit her leg against a walker and developed a wound. She has home health and they have been dressing this with silver alginate daily. She reports stability in the wound. She denies signs of infection. 1/23; patient presents for follow-up. She has been using Hydrofera Blue with dressing changes. She has no issues or complaints today. She denies signs of infection. Patient History Information obtained from Patient. Social History Current every day smoker -  half pack daily, Marital Status - Separated, Alcohol Use - Never, Drug Use - Prior History - recreational when young, Caffeine Use - Daily - soda. Medical History Hematologic/Lymphatic Patient has history of Anemia Denies history of Hemophilia, Human Immunodeficiency Virus, Lymphedema, Sickle Cell Disease Respiratory Patient has history of Asthma, Chronic Obstructive Pulmonary Disease (COPD) Denies history of Aspiration, Pneumothorax, Sleep Apnea, Tuberculosis Cardiovascular Patient has history of Congestive Heart Failure, Deep Vein Thrombosis - hx DVT Rt leg, Hypertension, Peripheral Arterial Disease Denies history of Angina Endocrine Patient has history of Type II Diabetes Musculoskeletal Patient has history of Gout, Osteoarthritis Medical A Surgical History Notes nd Gastrointestinal fatty liver Genitourinary CKD Objective Constitutional respirations regular, non-labored and within target range for patient.. Vitals Time Taken: 1:30 PM, Height: 63 in, Weight: 191 lbs, BMI: 33.8, Temperature: 98.2 F, Pulse: 83 bpm, Respiratory Rate: 18 breaths/min, Blood Pressure: 128/59 mmHg, Capillary Blood Glucose: 184 mg/dl. Cardiovascular 2+ dorsalis pedis/posterior tibialis pulses. Psychiatric pleasant and cooperative. General Notes: Left lower extremity: T the proximal medial aspect below the knee she has an open wound with granulation tissue and scant nonviable tissue. o No signs of surrounding infection. 2+ pitting edema to the knee. Integumentary (Hair, Skin) Wound #1 status is Open. Original cause of wound was Trauma. The date acquired was: 04/07/2021. The wound has been in treatment 1 weeks. The wound is located on the Left,Medial Lower Leg. The wound measures 2.8cm length x 1cm width x 0.1cm depth; 2.199cm^2 area and 0.22cm^3 volume. There is Fat Layer (Subcutaneous Tissue) exposed. There is no tunneling or undermining noted. There is a medium amount of serosanguineous drainage  noted. The wound margin is flat and intact. There is large (67-100%) red, pink granulation within the wound bed. There is a small (1-33%) amount of necrotic tissue within the wound bed including Adherent Slough. Assessment Active Problems ICD-10 Non-pressure chronic ulcer of other part of left lower leg with fat layer exposed Chronic kidney disease, stage 4 (severe) Peripheral vascular disease, unspecified Venous insufficiency (chronic) (peripheral) Lymphedema, not elsewhere classified Type 2 diabetes mellitus with other skin ulcer Patient's wound has shown improvement in size in appearance since last clinic visit. I debrided nonviable tissue. No signs of infection on exam. I recommended continuing with Hydrofera Blue and we will use Santyl only in office. Follow-up in 1 week. Procedures Wound #1 Pre-procedure diagnosis of Wound #1 is a Venous Leg Ulcer located on the Left,Medial Lower Leg .Severity of Tissue Pre Debridement is: Fat layer exposed. There was a Excisional Skin/Subcutaneous Tissue Debridement with a total area of 2.8 sq cm performed by [name] Shan, DO. With the following instrument(s): Curette to remove Viable and Non-Viable tissue/material. Material removed includes Subcutaneous Tissue, Slough, Skin: Dermis, Skin: Epidermis, and Fibrin/Exudate after achieving  pain control using Lidocaine 4% Topical Solution. A time out was conducted at 14:00, prior to the start of the procedure. A Minimum amount of bleeding was controlled with Pressure. The procedure was tolerated well with a pain level of 0 throughout and a pain level of 0 following the procedure. Post Debridement Measurements: 2.8cm length x 1cm width x 0.1cm depth; 0.22cm^3 volume. Character of Wound/Ulcer Post Debridement is improved. Severity of Tissue Post Debridement is: Fat layer exposed. Post procedure Diagnosis Wound #1: Same as Pre-Procedure Plan Follow-up Appointments: Return Appointment in 1 week. - Dr.  Heber [place] Bathing/ Shower/ Hygiene: May shower and wash wound with soap and water. Edema Control - Lymphedema / SCD / Other: Elevate legs to the level of the heart or above for 30 minutes daily and/or when sitting, a frequency of: - 3-4 times a day throughout the day. Avoid standing for long periods of time. Moisturize legs daily. - every night before bed. Home Health: No change in wound care orders this week; continue Home Health for wound care. May utilize formulary equivalent dressing for wound treatment orders unless otherwise specified. Other Home Health Orders/Instructions: - [facility] WOUND #1: - Lower Leg Wound Laterality: Left, Medial Cleanser: Wound Cleanser (Home Health) 1 x Per Day/7 Days Discharge Instructions: Cleanse the wound with wound cleanser prior to applying a clean dressing using gauze sponges, not tissue or cotton balls. Peri-Wound Care: Skin Prep (Home Health) 1 x Per Day/7 Days Discharge Instructions: Use skin prep as directed Prim Dressing: Hydrofera Blue Classic Foam, 4x4 in (Home Health) 1 x Per Day/7 Days ary Discharge Instructions: Moisten with saline prior to applying to wound bed Prim Dressing: Santyl Ointment 1 x Per Day/7 Days ary Discharge Instructions: **IN CLINIC ONLY** Secondary Dressing: Bordered Gauze, 4x4 in ([place]) 1 x Per Day/7 Days Discharge Instructions: Apply over primary dressing as directed. 1. In office sharp debridement 2. Hydrofera Blue 3. Follow-up in 1 week Electronic Signature(s) Signed: 10/30/2021 2:19:36 PM By: [name] Shan DO Entered By: [name] [name] on 10/30/2021 14:19:01 -------------------------------------------------------------------------------- HxROS Details Patient Name: Date of Service: [name] [name], [name]. 10/30/2021 12:45 PM Medical Record Number: [id number] Patient Account Number: [id number] Date of Birth/Sex: Treating RN: [birth year] (71 y.o. [name] [name] Primary Care Provider: Nolene [name] Other Clinician: Referring Provider: Treating Provider/Extender: [name] [name] in Treatment: 1 Information Obtained From Patient Hematologic/Lymphatic Medical History: Positive for: Anemia Negative for: Hemophilia; Human Immunodeficiency Virus; Lymphedema; Sickle Cell Disease Respiratory Medical History: Positive for: Asthma; Chronic Obstructive Pulmonary Disease (COPD) Negative for: Aspiration; Pneumothorax; Sleep Apnea; Tuberculosis Cardiovascular Medical History: Positive for: Congestive Heart Failure; Deep Vein Thrombosis - hx DVT Rt leg; Hypertension; Peripheral Arterial Disease Negative for: Angina Gastrointestinal Medical History: Past Medical History Notes: fatty liver Endocrine Medical History: Positive for: Type II Diabetes Time with diabetes: 10+yrs Treated with: Insulin Blood sugar tested every day: Yes Tested : 2 Genitourinary Medical History: Past Medical History Notes: CKD Musculoskeletal Medical History: Positive for: Gout; Osteoarthritis Immunizations Pneumococcal Vaccine: Received Pneumococcal Vaccination: No Implantable Devices None Family and Social History Current every day smoker - half pack daily; Marital Status - Separated; Alcohol Use: Never; Drug Use: Prior History - recreational when young; Caffeine Use: Daily - soda; Financial Concerns: No; Food, Clothing or Shelter Needs: No; Support System Lacking: No; Transportation Concerns: No Electronic Signature(s) Signed: 10/30/2021 2:19:36 PM By: [name] Shan DO Signed: 10/30/2021 3:38:08 PM By: Lorrin Jackson Entered By: [name] [name] on 10/30/2021 14:17:02 -------------------------------------------------------------------------------- SuperBill Details Patient Name: Date  of Service: [name] [name], [name] [name] 10/30/2021 Medical Record Number: [id number] Patient Account Number: [id number] Date of Birth/Sex: Treating RN: [birth year] (71 y.o. [name] [name],  [name] [name] Primary Care Provider: Nolene [name] Other Clinician: Referring Provider: Treating Provider/Extender: [name] [name] in Treatment: 1 Diagnosis Coding ICD-10 Codes Code Description [phone] Non-pressure chronic ulcer of other part of left lower leg with fat layer exposed N18.4 Chronic kidney disease, stage 4 (severe) I73.9 Peripheral vascular disease, unspecified I87.2 Venous insufficiency (chronic) (peripheral) I89.0 Lymphedema, not elsewhere classified E11.622 Type 2 diabetes mellitus with other skin ulcer Facility Procedures CPT4 Code: [id number] Description: 15945 - DEB SUBQ TISSUE 20 SQ CM/< ICD-10 Diagnosis Description L97.822 Non-pressure chronic ulcer of other part of left lower leg with fat layer expo Modifier: sed Quantity: 1 Physician Procedures : CPT4 Code Description Modifier [id number] 11042 - WC PHYS SUBQ TISS 20 SQ CM ICD-10 Diagnosis Description L97.822 Non-pressure chronic ulcer of other part of left lower leg with fat layer exposed Quantity: 1 Electronic Signature(s) Signed: 10/30/2021 2:19:36 PM By: [name] Shan DO Entered By: [name] [name] on 10/30/2021 14:19:13

## 2021-10-30 NOTE — Progress Notes (Signed)
[name] [name], [name] [name] ([id number]) Visit Report for 10/30/2021 Arrival Information Details Patient Name: Date of Service: PA CESIA, ORF 10/30/2021 12:45 PM Medical Record Number: [id number] Patient Account Number: [id number] Date of Birth/Sex: Treating RN: [birth year] (71 y.o. Debby Bud Primary Care Braxton Weisbecker: [name] Other Clinician: Referring Lauraann Missey: Treating Laquia Rosano/Extender: Marin Olp in Treatment: 1 Visit Information History Since Last Visit Added or deleted any medications: No Patient Arrived: Wheel Chair Any new allergies or adverse reactions: No Arrival Time: 13:32 Had a fall or experienced change in No Accompanied By: sister activities of daily living that may affect Transfer Assistance: Manual risk of falls: Patient Identification Verified: Yes Signs or symptoms of abuse/neglect since last visito No Secondary Verification Process Completed: Yes Hospitalized since last visit: No Patient Requires Transmission-Based Precautions: No Implantable device outside of the clinic excluding No Patient Has Alerts: Yes cellular tissue based products placed in the center Patient Alerts: Patient on Blood Thinner since last visit: Has Dressing in Place as Prescribed: Yes Pain Present Now: Yes Electronic Signature(s) Signed: 10/30/2021 5:00:04 PM By: Deon Pilling RN, BSN Entered By: Deon Pilling on 10/30/2021 13:32:57 -------------------------------------------------------------------------------- Encounter Discharge Information Details Patient Name: Date of Service: [name], Donyea P. 10/30/2021 12:45 PM Medical Record Number: [id number] Patient Account Number: [id number] Date of Birth/Sex: Treating RN: 1951-09-27 (70 y.o. Debby Bud Primary Care Duron Meister: [name] Other Clinician: Referring Tareka Jhaveri: Treating Marjorie Lussier/Extender: Marin Olp in Treatment: 1 Encounter Discharge Information Items  Post Procedure Vitals Discharge Condition: Stable Temperature (F): 98.2 Ambulatory Status: Wheelchair Pulse (bpm): 83 Discharge Destination: Home Respiratory Rate (breaths/min): 18 Transportation: Private Auto Blood Pressure (mmHg): 128/59 Accompanied By: sister Schedule Follow-up Appointment: Yes Clinical Summary of Care: Electronic Signature(s) Signed: 10/30/2021 5:00:04 PM By: Deon Pilling RN, BSN Entered By: Deon Pilling on 10/30/2021 15:24:12 -------------------------------------------------------------------------------- Lower Extremity Assessment Details Patient Name: Date of Service: [name], Odalis P. 10/30/2021 12:45 PM Medical Record Number: [id number] Patient Account Number: [id number] Date of Birth/Sex: Treating RN: September 27, 1951 (71 y.o. Debby Bud Primary Care Darrien Laakso: [name] Other Clinician: Referring Iridian Reader: Treating Cheskel Silverio/Extender: Marin Olp in Treatment: 1 Edema Assessment Assessed: Shirlyn Goltz: Yes] Patrice Paradise: No] Edema: [Left: Ye] [Right: s] Calf Left: Right: Point of Measurement: 31 cm From Medial Instep 32 cm Ankle Left: Right: Point of Measurement: 10 cm From Medial Instep 28 cm Electronic Signature(s) Signed: 10/30/2021 5:00:04 PM By: Deon Pilling RN, BSN Entered By: Deon Pilling on 10/30/2021 13:36:25 -------------------------------------------------------------------------------- Multi Wound Chart Details Patient Name: Date of Service: [name], Renelle P. 10/30/2021 12:45 PM Medical Record Number: [id number] Patient Account Number: [id number] Date of Birth/Sex: Treating RN: [birth year] (70 y.o. Sue Lush Primary Care Raeana Blinn: [name] Other Clinician: Referring Naje Rice: Treating Kendon Sedeno/Extender: Marin Olp in Treatment: 1 Vital Signs Height(in): 63 Capillary Blood Glucose(mg/dl): 184 Weight(lbs): 191 Pulse(bpm): 33 Body Mass Index(BMI): 33.8 Blood  Pressure(mmHg): 128/59 Temperature(F): 98.2 Respiratory Rate(breaths/min): 18 Photos: [N/A:N/A] Left, Medial Lower Leg N/A N/A Wound Location: Trauma N/A N/A Wounding Event: Venous Leg Ulcer N/A N/A Primary Etiology: Anemia, Asthma, Chronic Obstructive N/A N/A Comorbid History: Pulmonary Disease (COPD), Congestive Heart Failure, Deep Vein Thrombosis, Hypertension, Peripheral Arterial Disease, Type II Diabetes, Gout, Osteoarthritis 04/07/2021 N/A N/A Date Acquired: 1 N/A N/A Weeks of Treatment: Open N/A N/A Wound Status: No N/A N/A Wound Recurrence: 2.8x1x0.1 N/A N/A Measurements L x W x D (cm) 2.199 N/A N/A A (cm) : rea 0.22 N/A N/A Volume (cm) : 43.40% N/A  N/A % Reduction in A rea: 71.70% N/A N/A % Reduction in Volume: Full Thickness Without Exposed N/A N/A Classification: Support Structures Medium N/A N/A Exudate A mount: Serosanguineous N/A N/A Exudate Type: red, brown N/A N/A Exudate Color: Flat and Intact N/A N/A Wound Margin: Large (67-100%) N/A N/A Granulation A mount: Red, Pink N/A N/A Granulation Quality: Small (1-33%) N/A N/A Necrotic A mount: Fat Layer (Subcutaneous Tissue): Yes N/A N/A Exposed Structures: Fascia: No Tendon: No Muscle: No Joint: No Bone: No Medium (34-66%) N/A N/A Epithelialization: Debridement - Excisional N/A N/A Debridement: Pre-procedure Verification/Time Out 14:00 N/A N/A Taken: Lidocaine 4% Topical Solution N/A N/A Pain Control: Subcutaneous, Slough N/A N/A Tissue Debrided: Skin/Subcutaneous Tissue N/A N/A Level: 2.8 N/A N/A Debridement A (sq cm): rea Curette N/A N/A Instrument: Minimum N/A N/A Bleeding: Pressure N/A N/A Hemostasis A chieved: 0 N/A N/A Procedural Pain: 0 N/A N/A Post Procedural Pain: Procedure was tolerated well N/A N/A Debridement Treatment Response: 2.8x1x0.1 N/A N/A Post Debridement Measurements L x W x D (cm) 0.22 N/A N/A Post Debridement Volume: (cm) Debridement N/A  N/A Procedures Performed: Treatment Notes Electronic Signature(s) Signed: 10/30/2021 2:19:36 PM By: Kalman Shan DO Signed: 10/30/2021 3:38:08 PM By: Lorrin Jackson Entered By: Kalman Shan on 10/30/2021 14:12:51 -------------------------------------------------------------------------------- Multi-Disciplinary Care Plan Details Patient Name: Date of Service: [name], Cerenity P. 10/30/2021 12:45 PM Medical Record Number: [id number] Patient Account Number: [id number] Date of Birth/Sex: Treating RN: 1951/03/08 (71 y.o. Helene Shoe, Tammi Klippel Primary Care Susana Gripp: [name] Other Clinician: Referring Lige Lakeman: Treating Draylon Mercadel/Extender: Marin Olp in Treatment: 1 Multidisciplinary Care Plan reviewed with physician Active Inactive Abuse / Safety / Falls / Self Care Management Nursing Diagnoses: Potential for falls Potential for injury related to falls Goals: Patient will not experience any injury related to falls Date Initiated: 10/20/2021 Target Resolution Date: 11/17/2021 Goal Status: Active Patient/caregiver will verbalize/demonstrate measures taken to prevent injury and/or falls Date Initiated: 10/20/2021 Target Resolution Date: 11/17/2021 Goal Status: Active Interventions: Assess Activities of Daily Living upon admission and as needed Assess fall risk on admission and as needed Assess: immobility, friction, shearing, incontinence upon admission and as needed Assess impairment of mobility on admission and as needed per policy Assess personal safety and home safety (as indicated) on admission and as needed Assess self care needs on admission and as needed Provide education on fall prevention Provide education on personal and home safety Notes: Nutrition Nursing Diagnoses: Impaired glucose control: actual or potential Potential for alteratiion in Nutrition/Potential for imbalanced nutrition Goals: Patient/caregiver agrees to and verbalizes  understanding of need to use nutritional supplements and/or vitamins as prescribed Date Initiated: 10/20/2021 Target Resolution Date: 11/17/2021 Goal Status: Active Patient/caregiver will maintain therapeutic glucose control Date Initiated: 10/20/2021 Target Resolution Date: 11/17/2021 Goal Status: Active Interventions: Assess HgA1c results as ordered upon admission and as needed Assess patient nutrition upon admission and as needed per policy Provide education on elevated blood sugars and impact on wound healing Provide education on nutrition Notes: Wound/Skin Impairment Nursing Diagnoses: Impaired tissue integrity Knowledge deficit related to ulceration/compromised skin integrity Goals: Patient/caregiver will verbalize understanding of skin care regimen Date Initiated: 10/20/2021 Target Resolution Date: 11/17/2021 Goal Status: Active Ulcer/skin breakdown will have a volume reduction of 30% by week 4 Date Initiated: 10/20/2021 Target Resolution Date: 11/17/2021 Goal Status: Active Interventions: Assess patient/caregiver ability to obtain necessary supplies Assess patient/caregiver ability to perform ulcer/skin care regimen upon admission and as needed Assess ulceration(s) every visit Provide education on ulcer and skin care Notes: Electronic  Signature(s) Signed: 10/30/2021 5:00:04 PM By: Deon Pilling RN, BSN Entered By: Deon Pilling on 10/30/2021 13:45:13 -------------------------------------------------------------------------------- Pain Assessment Details Patient Name: Date of Service: [name], Jo P. 10/30/2021 12:45 PM Medical Record Number: [id number] Patient Account Number: [id number] Date of Birth/Sex: Treating RN: 1951/06/04 (71 y.o. Debby Bud Primary Care Theo Krumholz: [name] Other Clinician: Referring Kenneth Lax: Treating Nautika Cressey/Extender: Marin Olp in Treatment: 1 Active Problems Location of Pain Severity and Description  of Pain Patient Has Paino Yes Site Locations Pain Location: Generalized Pain, Pain in Ulcers Rate the pain. Current Pain Level: 7 Worst Pain Level: 9 Least Pain Level: 0 Tolerable Pain Level: 8 Pain Management and Medication Current Pain Management: Medication: No Cold Application: No Rest: No Massage: No Activity: No T.E.N.S.: No Heat Application: No Leg drop or elevation: No Is the Current Pain Management Adequate: Adequate How does your wound impact your activities of daily livingo Sleep: No Bathing: No Appetite: No Relationship With Others: No Bladder Continence: No Emotions: No Bowel Continence: No Work: No Toileting: No Drive: No Dressing: No Hobbies: No Engineer, maintenance) Signed: 10/30/2021 5:00:04 PM By: Deon Pilling RN, BSN Entered By: Deon Pilling on 10/30/2021 13:33:44 -------------------------------------------------------------------------------- Patient/Caregiver Education Details Patient Name: Date of Service: [name], Marybelle Mamie Nick 1/23/2023andnbsp12:45 PM Medical Record Number: [id number] Patient Account Number: [id number] Date of Birth/Gender: Treating RN: 08/14/1951 (71 y.o. Debby Bud Primary Care Physician: [name] Other Clinician: Referring Physician: Treating Physician/Extender: Marin Olp in Treatment: 1 Education Assessment Education Provided To: Patient Education Topics Provided Wound/Skin Impairment: Handouts: Skin Care Do's and Dont's Methods: Explain/Verbal Responses: Reinforcements needed Electronic Signature(s) Signed: 10/30/2021 5:00:04 PM By: Deon Pilling RN, BSN Entered By: Deon Pilling on 10/30/2021 13:45:42 -------------------------------------------------------------------------------- Wound Assessment Details Patient Name: Date of Service: [name], Hampton P. 10/30/2021 12:45 PM Medical Record Number: [id number] Patient Account Number: [id number] Date of  Birth/Sex: Treating RN: [birth year] (71 y.o. Helene Shoe, Tammi Klippel Primary Care Rielynn Trulson: [name] Other Clinician: Referring Shamina Etheridge: Treating Shelbylynn Walczyk/Extender: Marin Olp in Treatment: 1 Wound Status Wound Number: 1 Primary Venous Leg Ulcer Etiology: Wound Location: Left, Medial Lower Leg Wound Open Wounding Event: Trauma Status: Date Acquired: 04/07/2021 Comorbid Anemia, Asthma, Chronic Obstructive Pulmonary Disease (COPD), Weeks Of Treatment: 1 History: Congestive Heart Failure, Deep Vein Thrombosis, Hypertension, Clustered Wound: No Peripheral Arterial Disease, Type II Diabetes, Gout, Osteoarthritis Photos Wound Measurements Length: (cm) 2.8 Width: (cm) 1 Depth: (cm) 0.1 Area: (cm) 2.199 Volume: (cm) 0.22 % Reduction in Area: 43.4% % Reduction in Volume: 71.7% Epithelialization: Medium (34-66%) Tunneling: No Undermining: No Wound Description Classification: Full Thickness Without Exposed Support Structures Wound Margin: Flat and Intact Exudate Amount: Medium Exudate Type: Serosanguineous Exudate Color: red, brown Foul Odor After Cleansing: No Slough/Fibrino Yes Wound Bed Granulation Amount: Large (67-100%) Exposed Structure Granulation Quality: Red, Pink Fascia Exposed: No Necrotic Amount: Small (1-33%) Fat Layer (Subcutaneous Tissue) Exposed: Yes Necrotic Quality: Adherent Slough Tendon Exposed: No Muscle Exposed: No Joint Exposed: No Bone Exposed: No Treatment Notes Wound #1 (Lower Leg) Wound Laterality: Left, Medial Cleanser Wound Cleanser Discharge Instruction: Cleanse the wound with wound cleanser prior to applying a clean dressing using gauze sponges, not tissue or cotton balls. Peri-Wound Care Skin Prep Discharge Instruction: Use skin prep as directed Topical Primary Dressing Hydrofera Blue Classic Foam, 4x4 in Discharge Instruction: Moisten with saline prior to applying to wound bed Santyl Ointment Discharge  Instruction: **IN CLINIC ONLY** Secondary Dressing Bordered Gauze, 4x4 in Discharge Instruction: Apply over  primary dressing as directed. Secured With Compression Wrap Compression Stockings Environmental education officer) Signed: 10/30/2021 5:00:04 PM By: Deon Pilling RN, BSN Entered By: Deon Pilling on 10/30/2021 13:41:08 -------------------------------------------------------------------------------- Vitals Details Patient Name: Date of Service: [name], Brittnae P. 10/30/2021 12:45 PM Medical Record Number: [id number] Patient Account Number: [id number] Date of Birth/Sex: Treating RN: 02/06/1951 (71 y.o. Helene Shoe, Tammi Klippel Primary Care Verlon Pischke: [name] Other Clinician: Referring Toluwani Yadav: Treating Juston Goheen/Extender: Marin Olp in Treatment: 1 Vital Signs Time Taken: 13:30 Temperature (F): 98.2 Height (in): 63 Pulse (bpm): 83 Weight (lbs): 191 Respiratory Rate (breaths/min): 18 Body Mass Index (BMI): 33.8 Blood Pressure (mmHg): 128/59 Capillary Blood Glucose (mg/dl): 184 Reference Range: 80 - 120 mg / dl Electronic Signature(s) Signed: 10/30/2021 5:00:04 PM By: Deon Pilling RN, BSN Entered By: Deon Pilling on 10/30/2021 13:33:20

## 2021-11-01 ENCOUNTER — Encounter (HOSPITAL_COMMUNITY): Payer: 59

## 2021-11-06 ENCOUNTER — Encounter (HOSPITAL_BASED_OUTPATIENT_CLINIC_OR_DEPARTMENT_OTHER): Payer: 59 | Admitting: Internal Medicine

## 2021-11-13 ENCOUNTER — Other Ambulatory Visit: Payer: Self-pay

## 2021-11-13 ENCOUNTER — Encounter (HOSPITAL_BASED_OUTPATIENT_CLINIC_OR_DEPARTMENT_OTHER): Payer: Medicare Other | Attending: Internal Medicine | Admitting: Internal Medicine

## 2021-11-13 DIAGNOSIS — I872 Venous insufficiency (chronic) (peripheral): Secondary | ICD-10-CM | POA: Insufficient documentation

## 2021-11-13 DIAGNOSIS — F17200 Nicotine dependence, unspecified, uncomplicated: Secondary | ICD-10-CM | POA: Insufficient documentation

## 2021-11-13 DIAGNOSIS — E11622 Type 2 diabetes mellitus with other skin ulcer: Secondary | ICD-10-CM | POA: Insufficient documentation

## 2021-11-13 DIAGNOSIS — I890 Lymphedema, not elsewhere classified: Secondary | ICD-10-CM | POA: Insufficient documentation

## 2021-11-13 DIAGNOSIS — I509 Heart failure, unspecified: Secondary | ICD-10-CM | POA: Diagnosis not present

## 2021-11-13 DIAGNOSIS — N184 Chronic kidney disease, stage 4 (severe): Secondary | ICD-10-CM | POA: Insufficient documentation

## 2021-11-13 DIAGNOSIS — E1122 Type 2 diabetes mellitus with diabetic chronic kidney disease: Secondary | ICD-10-CM | POA: Diagnosis not present

## 2021-11-13 DIAGNOSIS — J449 Chronic obstructive pulmonary disease, unspecified: Secondary | ICD-10-CM | POA: Insufficient documentation

## 2021-11-13 DIAGNOSIS — Z794 Long term (current) use of insulin: Secondary | ICD-10-CM | POA: Insufficient documentation

## 2021-11-13 DIAGNOSIS — I130 Hypertensive heart and chronic kidney disease with heart failure and stage 1 through stage 4 chronic kidney disease, or unspecified chronic kidney disease: Secondary | ICD-10-CM | POA: Diagnosis not present

## 2021-11-13 DIAGNOSIS — L97822 Non-pressure chronic ulcer of other part of left lower leg with fat layer exposed: Secondary | ICD-10-CM | POA: Diagnosis not present

## 2021-11-13 DIAGNOSIS — M1990 Unspecified osteoarthritis, unspecified site: Secondary | ICD-10-CM | POA: Insufficient documentation

## 2021-11-13 DIAGNOSIS — E1151 Type 2 diabetes mellitus with diabetic peripheral angiopathy without gangrene: Secondary | ICD-10-CM | POA: Diagnosis not present

## 2021-11-13 NOTE — Progress Notes (Signed)
BRENNEN, GARDINER ([id number]) Visit Report for 11/13/2021 Arrival Information Details Patient Name: Date of Service: PA [name] [name], [name] [name] 11/13/2021 1:30 PM Medical Record Number: [id number] Patient Account Number: [id number] Date of Birth/Sex: Treating RN: 04/01/1951 (71 y.o. Debby Bud Primary Care Samyia Motter: [name] Other Clinician: Referring Heather Streeper: Treating Yasir Kitner/Extender: Marin Olp in Treatment: 3 Visit Information History Since Last Visit Added or deleted any medications: No Patient Arrived: Wheel Chair Any new allergies or adverse reactions: No Arrival Time: 13:41 Had a fall or experienced change in No Accompanied By: family member activities of daily living that may affect Transfer Assistance: None risk of falls: Patient Identification Verified: Yes Signs or symptoms of abuse/neglect since last visito No Secondary Verification Process Completed: Yes Hospitalized since last visit: No Patient Requires Transmission-Based Precautions: No Has Dressing in Place as Prescribed: Yes Patient Has Alerts: Yes Pain Present Now: Yes Patient Alerts: Patient on Blood Thinner Electronic Signature(s) Signed: 11/13/2021 4:47:34 PM By: Deon Pilling RN, BSN Entered By: Deon Pilling on 11/13/2021 13:42:09 -------------------------------------------------------------------------------- Encounter Discharge Information Details Patient Name: Date of Service: PA [name] [name], [name]. 11/13/2021 1:30 PM Medical Record Number: [id number] Patient Account Number: [id number] Date of Birth/Sex: Treating RN: Sep 05, 1951 (71 y.o. Debby Bud Primary Care Cyani Kallstrom: [name] Other Clinician: Referring Winifred Balogh: Treating Quentez Lober/Extender: Marin Olp in Treatment: 3 Encounter Discharge Information Items Post Procedure Vitals Discharge Condition: Stable Temperature (F): 97.9 Ambulatory Status: Wheelchair Pulse (bpm):  89 Discharge Destination: Home Respiratory Rate (breaths/min): 20 Transportation: Private Auto Blood Pressure (mmHg): 166/78 Accompanied By: family member Schedule Follow-up Appointment: Yes Clinical Summary of Care: Electronic Signature(s) Signed: 11/13/2021 4:47:34 PM By: Deon Pilling RN, BSN Entered By: Deon Pilling on 11/13/2021 14:08:31 -------------------------------------------------------------------------------- Lower Extremity Assessment Details Patient Name: Date of Service: PA [name] [name], [name]. 11/13/2021 1:30 PM Medical Record Number: [id number] Patient Account Number: [id number] Date of Birth/Sex: Treating RN: [birth year] (71 y.o. Debby Bud Primary Care Janeen Watson: [name] Other Clinician: Referring Wyatt Galvan: Treating Annaleah Arata/Extender: Marin Olp in Treatment: 3 Edema Assessment Assessed: Shirlyn Goltz: Yes] Patrice Paradise: No] Edema: [Left: Ye] [Right: s] Calf Left: Right: Point of Measurement: 31 cm From Medial Instep 37 cm Ankle Left: Right: Point of Measurement: 10 cm From Medial Instep 31 cm Vascular Assessment Pulses: Dorsalis Pedis Palpable: [Left:Yes] Electronic Signature(s) Signed: 11/13/2021 4:47:34 PM By: Deon Pilling RN, BSN Entered By: Deon Pilling on 11/13/2021 13:42:54 -------------------------------------------------------------------------------- Multi Wound Chart Details Patient Name: Date of Service: PA [name] [name], [name]. 11/13/2021 1:30 PM Medical Record Number: [id number] Patient Account Number: [id number] Date of Birth/Sex: Treating RN: [birth year] (71 y.o. Sue Lush Primary Care Marigold Mom: [name] Other Clinician: Referring Rissie Sculley: Treating Nyx Keady/Extender: Marin Olp in Treatment: 3 Vital Signs Height(in): 63 Capillary Blood Glucose(mg/dl): 261 Weight(lbs): 191 Pulse(bpm): 65 Body Mass Index(BMI): 33.8 Blood Pressure(mmHg): 166/78 Temperature(F):  97.9 Respiratory Rate(breaths/min): 20 Photos: [N/A:N/A] Left, Medial Lower Leg N/A N/A Wound Location: Trauma N/A N/A Wounding Event: Venous Leg Ulcer N/A N/A Primary Etiology: Anemia, Asthma, Chronic Obstructive N/A N/A Comorbid History: Pulmonary Disease (COPD), Congestive Heart Failure, Deep Vein Thrombosis, Hypertension, Peripheral Arterial Disease, Type II Diabetes, Gout, Osteoarthritis 04/07/2021 N/A N/A Date Acquired: 3 N/A N/A Weeks of Treatment: Open N/A N/A Wound Status: No N/A N/A Wound Recurrence: 2.8x1.2x0.1 N/A N/A Measurements L x W x D (cm) 2.639 N/A N/A A (cm) : rea 0.264 N/A N/A Volume (cm) : 32.10% N/A N/A % Reduction in A rea: 66.10% N/A N/A %  Reduction in Volume: Full Thickness Without Exposed N/A N/A Classification: Support Structures Medium N/A N/A Exudate A mount: Serosanguineous N/A N/A Exudate Type: red, brown N/A N/A Exudate Color: Flat and Intact N/A N/A Wound Margin: Large (67-100%) N/A N/A Granulation A mount: Red, Pink N/A N/A Granulation Quality: Small (1-33%) N/A N/A Necrotic A mount: Fat Layer (Subcutaneous Tissue): Yes N/A N/A Exposed Structures: Fascia: No Tendon: No Muscle: No Joint: No Bone: No Medium (34-66%) N/A N/A Epithelialization: Debridement - Excisional N/A N/A Debridement: Pre-procedure Verification/Time Out 14:00 N/A N/A Taken: Lidocaine 4% Topical Solution N/A N/A Pain Control: Subcutaneous, Slough N/A N/A Tissue Debrided: Skin/Subcutaneous Tissue N/A N/A Level: 3.36 N/A N/A Debridement A (sq cm): rea Curette N/A N/A Instrument: Minimum N/A N/A Bleeding: Pressure N/A N/A Hemostasis A chieved: 0 N/A N/A Procedural Pain: 0 N/A N/A Post Procedural Pain: Procedure was tolerated well N/A N/A Debridement Treatment Response: 2.8x1.2x0.1 N/A N/A Post Debridement Measurements L x W x D (cm) 0.264 N/A N/A Post Debridement Volume: (cm) Debridement N/A N/A Procedures  Performed: Treatment Notes Wound #1 (Lower Leg) Wound Laterality: Left, Medial Cleanser Wound Cleanser Discharge Instruction: Cleanse the wound with wound cleanser prior to applying a clean dressing using gauze sponges, not tissue or cotton balls. Peri-Wound Care Skin Prep Discharge Instruction: Use skin prep as directed Topical Primary Dressing Hydrofera Blue Classic Foam, 4x4 in Discharge Instruction: Moisten with saline prior to applying to wound bed Santyl Ointment Discharge Instruction: **IN CLINIC ONLY** Secondary Dressing Bordered Gauze, 4x4 in Discharge Instruction: Apply over primary dressing as directed. Secured With Compression Wrap Compression Stockings Add-Ons Electronic Signature(s) Signed: 11/13/2021 2:27:39 PM By: Kalman Shan DO Signed: 11/13/2021 4:43:35 PM By: Fara Chute By: Kalman Shan on 11/13/2021 14:20:42 -------------------------------------------------------------------------------- Multi-Disciplinary Care Plan Details Patient Name: Date of Service: PA [name] [name], [name]. 11/13/2021 1:30 PM Medical Record Number: [id number] Patient Account Number: [id number] Date of Birth/Sex: Treating RN: August 31, 1951 (71 y.o. Helene Shoe, Tammi Klippel Primary Care Pippa Hanif: [name] Other Clinician: Referring Seriah Brotzman: Treating Tricha Ruggirello/Extender: Marin Olp in Treatment: 3 Multidisciplinary Care Plan reviewed with physician Active Inactive Abuse / Safety / Falls / Self Care Management Nursing Diagnoses: Potential for falls Potential for injury related to falls Goals: Patient will not experience any injury related to falls Date Initiated: 10/20/2021 Target Resolution Date: 11/24/2021 Goal Status: Active Patient/caregiver will verbalize/demonstrate measures taken to prevent injury and/or falls Date Initiated: 10/20/2021 Target Resolution Date: 11/24/2021 Goal Status: Active Interventions: Assess Activities of Daily Living  upon admission and as needed Assess fall risk on admission and as needed Assess: immobility, friction, shearing, incontinence upon admission and as needed Assess impairment of mobility on admission and as needed per policy Assess personal safety and home safety (as indicated) on admission and as needed Assess self care needs on admission and as needed Provide education on fall prevention Provide education on personal and home safety Notes: Nutrition Nursing Diagnoses: Impaired glucose control: actual or potential Potential for alteratiion in Nutrition/Potential for imbalanced nutrition Goals: Patient/caregiver agrees to and verbalizes understanding of need to use nutritional supplements and/or vitamins as prescribed Date Initiated: 10/20/2021 Target Resolution Date: 11/24/2021 Goal Status: Active Patient/caregiver will maintain therapeutic glucose control Date Initiated: 10/20/2021 Target Resolution Date: 11/24/2021 Goal Status: Active Interventions: Assess HgA1c results as ordered upon admission and as needed Assess patient nutrition upon admission and as needed per policy Provide education on elevated blood sugars and impact on wound healing Provide education on nutrition Notes: Wound/Skin Impairment Nursing Diagnoses: Impaired tissue integrity  Knowledge deficit related to ulceration/compromised skin integrity Goals: Patient/caregiver will verbalize understanding of skin care regimen Date Initiated: 10/20/2021 Date Inactivated: 11/13/2021 Target Resolution Date: 11/17/2021 Goal Status: Met Ulcer/skin breakdown will have a volume reduction of 30% by week 4 Date Initiated: 10/20/2021 Target Resolution Date: 11/20/2021 Goal Status: Active Interventions: Assess patient/caregiver ability to obtain necessary supplies Assess patient/caregiver ability to perform ulcer/skin care regimen upon admission and as needed Assess ulceration(s) every visit Provide education on ulcer and skin  care Notes: Electronic Signature(s) Signed: 11/13/2021 4:47:34 PM By: Deon Pilling RN, BSN Entered By: Deon Pilling on 11/13/2021 13:45:11 -------------------------------------------------------------------------------- Pain Assessment Details Patient Name: Date of Service: PA [name] [name], [name]. 11/13/2021 1:30 PM Medical Record Number: [id number] Patient Account Number: [id number] Date of Birth/Sex: Treating RN: [birth year] (71 y.o. Debby Bud Primary Care Yordan Martindale: [name] Other Clinician: Referring Mickell Birdwell: Treating Elmarie Devlin/Extender: Marin Olp in Treatment: 3 Active Problems Location of Pain Severity and Description of Pain Patient Has Paino Yes Site Locations Pain Location: Generalized Pain, Pain in Ulcers Rate the pain. Current Pain Level: 6 Pain Management and Medication Current Pain Management: Medication: No Cold Application: No Rest: No Massage: No Activity: No T.E.N.S.: No Heat Application: No Leg drop or elevation: No Is the Current Pain Management Adequate: Adequate How does your wound impact your activities of daily livingo Sleep: No Bathing: No Appetite: No Relationship With Others: No Bladder Continence: No Emotions: No Bowel Continence: No Work: No Toileting: No Drive: No Dressing: No Hobbies: No Engineer, maintenance) Signed: 11/13/2021 4:47:34 PM By: Deon Pilling RN, BSN Entered By: Deon Pilling on 11/13/2021 13:42:43 -------------------------------------------------------------------------------- Patient/Caregiver Education Details Patient Name: Date of Service: PA [name] [name], [name] [name] 2/6/2023andnbsp1:30 PM Medical Record Number: [id number] Patient Account Number: [id number] Date of Birth/Gender: Treating RN: Feb 21, 1951 (71 y.o. Debby Bud Primary Care Physician: [name] Other Clinician: Referring Physician: Treating Physician/Extender: Marin Olp in  Treatment: 3 Education Assessment Education Provided To: Patient Education Topics Provided Elevated Blood Sugar/ Impact on Healing: Handouts: Elevated Blood Sugars: How Do They Affect Wound Healing Methods: Explain/Verbal Responses: Reinforcements needed Electronic Signature(s) Signed: 11/13/2021 4:47:34 PM By: Deon Pilling RN, BSN Entered By: Deon Pilling on 11/13/2021 13:45:24 -------------------------------------------------------------------------------- Wound Assessment Details Patient Name: Date of Service: PA [name] [name], [name]. 11/13/2021 1:30 PM Medical Record Number: [id number] Patient Account Number: [id number] Date of Birth/Sex: Treating RN: 1950/11/17 (71 y.o. Sue Lush Primary Care Aujanae Mccullum: [name] Other Clinician: Referring Caedmon Louque: Treating Paras Kreider/Extender: Marin Olp in Treatment: 3 Wound Status Wound Number: 1 Primary Venous Leg Ulcer Etiology: Wound Location: Left, Medial Lower Leg Wound Open Wounding Event: Trauma Status: Date Acquired: 04/07/2021 Comorbid Anemia, Asthma, Chronic Obstructive Pulmonary Disease (COPD), Weeks Of Treatment: 3 History: Congestive Heart Failure, Deep Vein Thrombosis, Hypertension, Clustered Wound: No Peripheral Arterial Disease, Type II Diabetes, Gout, Osteoarthritis Photos Wound Measurements Length: (cm) 2.8 Width: (cm) 1.2 Depth: (cm) 0.1 Area: (cm) 2.639 Volume: (cm) 0.264 % Reduction in Area: 32.1% % Reduction in Volume: 66.1% Epithelialization: Medium (34-66%) Tunneling: No Undermining: No Wound Description Classification: Full Thickness Without Exposed Support Structures Wound Margin: Flat and Intact Exudate Amount: Medium Exudate Type: Serosanguineous Exudate Color: red, brown Foul Odor After Cleansing: No Slough/Fibrino Yes Wound Bed Granulation Amount: Large (67-100%) Exposed Structure Granulation Quality: Red, Pink Fascia Exposed: No Necrotic Amount: Small  (1-33%) Fat Layer (Subcutaneous Tissue) Exposed: Yes Necrotic Quality: Adherent Slough Tendon Exposed: No Muscle Exposed: No Joint Exposed: No Bone Exposed: No Treatment Notes  Wound #1 (Lower Leg) Wound Laterality: Left, Medial Cleanser Wound Cleanser Discharge Instruction: Cleanse the wound with wound cleanser prior to applying a clean dressing using gauze sponges, not tissue or cotton balls. Peri-Wound Care Skin Prep Discharge Instruction: Use skin prep as directed Topical Primary Dressing Hydrofera Blue Classic Foam, 4x4 in Discharge Instruction: Moisten with saline prior to applying to wound bed Santyl Ointment Discharge Instruction: **IN CLINIC ONLY** Secondary Dressing Bordered Gauze, 4x4 in Discharge Instruction: Apply over primary dressing as directed. Secured With Compression Wrap Compression Stockings Environmental education officer) Signed: 11/13/2021 4:43:35 PM By: Lorrin Jackson Signed: 11/13/2021 4:47:34 PM By: Deon Pilling RN, BSN Entered By: Deon Pilling on 11/13/2021 13:43:10 -------------------------------------------------------------------------------- Vitals Details Patient Name: Date of Service: PA [name] [name], [name]. 11/13/2021 1:30 PM Medical Record Number: [id number] Patient Account Number: [id number] Date of Birth/Sex: Treating RN: 01-Mar-1951 (71 y.o. Helene Shoe, Tammi Klippel Primary Care Jake Fuhrmann: [name] Other Clinician: Referring Passion Lavin: Treating Prisca Gearing/Extender: Marin Olp in Treatment: 3 Vital Signs Time Taken: 13:40 Temperature (F): 97.9 Height (in): 63 Pulse (bpm): 89 Weight (lbs): 191 Respiratory Rate (breaths/min): 20 Body Mass Index (BMI): 33.8 Blood Pressure (mmHg): 166/78 Capillary Blood Glucose (mg/dl): 261 Reference Range: 80 - 120 mg / dl Electronic Signature(s) Signed: 11/13/2021 4:47:34 PM By: Deon Pilling RN, BSN Entered By: Deon Pilling on 11/13/2021 13:42:30

## 2021-11-13 NOTE — Progress Notes (Signed)
JAYLINE, KILBURG ([id number]) Visit Report for 11/13/2021 Chief Complaint Document Details Patient Name: Date of Service: PA MIKIAH, DURALL 11/13/2021 1:30 PM Medical Record Number: [id number] Patient Account Number: [id number] Date of Birth/Sex: Treating RN: [birth year] (71 y.o. Sue Lush Primary Care Provider: [name] Other Clinician: Referring Provider: Treating Provider/Extender: Marin Olp in Treatment: 3 Information Obtained from: Patient Chief Complaint 10/20/2021; Left lower extremity wound due to trauma Electronic Signature(s) Signed: 11/13/2021 2:27:39 PM By: Kalman Shan DO Entered By: Kalman Shan on 11/13/2021 14:21:08 -------------------------------------------------------------------------------- Debridement Details Patient Name: Date of Service: [name], Briawna P. 11/13/2021 1:30 PM Medical Record Number: [id number] Patient Account Number: [id number] Date of Birth/Sex: Treating RN: [birth year] (71 y.o. Helene Shoe, Tammi Klippel Primary Care Provider: [name] Other Clinician: Referring Provider: Treating Provider/Extender: Marin Olp in Treatment: 3 Debridement Performed for Assessment: Wound #1 Left,Medial Lower Leg Performed By: Physician Kalman Shan, DO Debridement Type: Debridement Severity of Tissue Pre Debridement: Fat layer exposed Level of Consciousness (Pre-procedure): Awake and Alert Pre-procedure Verification/Time Out Yes - 14:00 Taken: Start Time: 14:01 Pain Control: Lidocaine 4% T opical Solution T Area Debrided (L x W): otal 2.8 (cm) x 1.2 (cm) = 3.36 (cm) Tissue and other material debrided: Viable, Non-Viable, Slough, Subcutaneous, Skin: Dermis , Skin: Epidermis, Fibrin/Exudate, Slough Level: Skin/Subcutaneous Tissue Debridement Description: Excisional Instrument: Curette Bleeding: Minimum Hemostasis Achieved: Pressure End Time: 14:06 Procedural Pain: 0 Post  Procedural Pain: 0 Response to Treatment: Procedure was tolerated well Level of Consciousness (Post- Awake and Alert procedure): Post Debridement Measurements of Total Wound Length: (cm) 2.8 Width: (cm) 1.2 Depth: (cm) 0.1 Volume: (cm) 0.264 Character of Wound/Ulcer Post Debridement: Improved Severity of Tissue Post Debridement: Fat layer exposed Post Procedure Diagnosis Same as Pre-procedure Electronic Signature(s) Signed: 11/13/2021 2:27:39 PM By: Kalman Shan DO Signed: 11/13/2021 4:47:34 PM By: Deon Pilling RN, BSN Entered By: Deon Pilling on 11/13/2021 14:07:04 -------------------------------------------------------------------------------- HPI Details Patient Name: Date of Service: [name], Arantxa P. 11/13/2021 1:30 PM Medical Record Number: [id number] Patient Account Number: [id number] Date of Birth/Sex: Treating RN: 12-27-1950 (71 y.o. Sue Lush Primary Care Provider: [name] Other Clinician: Referring Provider: Treating Provider/Extender: Marin Olp in Treatment: 3 History of Present Illness HPI Description: Admission 10/20/2021 Ms. Elona Yinger is a 71 year old female with a past medical history of chronic kidney disease stage IV, type 2 diabetes on insulin with last hemoglobin A1c of 6.8, peripheral arterial disease, chronic venous insufficiency and lymphedema that presents to the clinic for a 5 to 44-month history of nonhealing ulcer to her left lower extremity. She states that she hit her leg against a walker and developed a wound. She has home health and they have been dressing this with silver alginate daily. She reports stability in the wound. She denies signs of infection. 1/23; patient presents for follow-up. She has been using Hydrofera Blue with dressing changes. She has no issues or complaints today. She denies signs of infection. 2/6; patient presents for follow-up. She has been using Hydrofera Blue with dressing  changes with no issues. She denies signs of infection. Electronic Signature(s) Signed: 11/13/2021 2:27:39 PM By: Kalman Shan DO Entered By: Kalman Shan on 11/13/2021 14:21:30 -------------------------------------------------------------------------------- Physical Exam Details Patient Name: Date of Service: [name], Jasey P. 11/13/2021 1:30 PM Medical Record Number: [id number] Patient Account Number: [id number] Date of Birth/Sex: Treating RN: [birth year] (71 y.o. Sue Lush Primary Care Provider: [name] Other Clinician: Referring Provider: Treating Provider/Extender: Kalman Shan  Loma Sender in Treatment: 3 Constitutional respirations regular, non-labored and within target range for patient.. Cardiovascular 2+ dorsalis pedis/posterior tibialis pulses. Psychiatric pleasant and cooperative. Notes Left lower extremity: T the proximal medial aspect below the knee she has an open wound with granulation tissue and scant nonviable tissue. No signs of o surrounding infection. 2+ pitting edema to the knee. Electronic Signature(s) Signed: 11/13/2021 2:27:39 PM By: Kalman Shan DO Entered By: Kalman Shan on 11/13/2021 14:22:00 -------------------------------------------------------------------------------- Physician Orders Details Patient Name: Date of Service: [name], Jalyne P. 11/13/2021 1:30 PM Medical Record Number: [id number] Patient Account Number: [id number] Date of Birth/Sex: Treating RN: November 06, 1950 (71 y.o. Helene Shoe, Tammi Klippel Primary Care Provider: [name] Other Clinician: Referring Provider: Treating Provider/Extender: Marin Olp in Treatment: 3 Verbal / Phone Orders: No Diagnosis Coding ICD-10 Coding Code Description [phone] Non-pressure chronic ulcer of other part of left lower leg with fat layer exposed N18.4 Chronic kidney disease, stage 4 (severe) I73.9 Peripheral vascular disease,  unspecified I87.2 Venous insufficiency (chronic) (peripheral) I89.0 Lymphedema, not elsewhere classified E11.622 Type 2 diabetes mellitus with other skin ulcer Follow-up Appointments ppointment in 2 weeks. - Dr. Heber Toole and Tammi Klippel, RN Return A Bathing/ Shower/ Hygiene May shower and wash wound with soap and water. Edema Control - Lymphedema / SCD / Other Elevate legs to the level of the heart or above for 30 minutes daily and/or when sitting, a frequency of: - 3-4 times a day throughout the day. Avoid standing for long periods of time. Moisturize legs daily. - every night before bed. Home Health New wound care orders this week; continue Home Health for wound care. May utilize formulary equivalent dressing for wound treatment orders unless otherwise specified. - Home health to continue twice a week dressing changes. Frequency change has decreased to every other day. Other Home Health Orders/Instructions: - [facility] Wound Treatment Wound #1 - Lower Leg Wound Laterality: Left, Medial Cleanser: Wound Cleanser (Home Health) Every Other Day/30 Days Discharge Instructions: Cleanse the wound with wound cleanser prior to applying a clean dressing using gauze sponges, not tissue or cotton balls. Peri-Wound Care: Skin Prep [facility]) Every Other Day/30 Days Discharge Instructions: Use skin prep as directed Prim Dressing: Hydrofera Blue Classic Foam, 4x4 in [facility]) Every Other Day/30 Days ary Discharge Instructions: Moisten with saline prior to applying to wound bed Prim Dressing: Santyl Ointment Every Other Day/30 Days ary Discharge Instructions: **IN CLINIC ONLY** Secondary Dressing: Bordered Gauze, 4x4 in [facility]) Every Other Day/30 Days Discharge Instructions: Apply over primary dressing as directed. Electronic Signature(s) Signed: 11/13/2021 2:27:39 PM By: Kalman Shan DO Entered By: Kalman Shan on 11/13/2021  14:22:18 -------------------------------------------------------------------------------- Problem List Details Patient Name: Date of Service: [name], Sheldon P. 11/13/2021 1:30 PM Medical Record Number: [id number] Patient Account Number: [id number] Date of Birth/Sex: Treating RN: 03-Jan-1951 (71 y.o. 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/13/2023 No Yes N18.4 Chronic kidney disease, stage 4 (severe) 10/20/2021 No Yes I73.9 Peripheral vascular disease, unspecified 10/20/2021 No Yes I87.2 Venous insufficiency (chronic) (peripheral) 10/20/2021 No Yes I89.0 Lymphedema, not elsewhere classified 10/20/2021 No Yes E11.622 Type 2 diabetes mellitus with other skin ulcer 10/20/2021 No Yes Inactive Problems Resolved Problems Electronic Signature(s) Signed: 11/13/2021 2:27:39 PM By: Kalman Shan DO Entered By: Kalman Shan  on 11/13/2021 14:20:35 -------------------------------------------------------------------------------- Progress Note Details Patient Name: Date of Service: PA LYNETT, BRASIL 11/13/2021 1:30 PM Medical Record Number: [id number] Patient Account Number: [id number] Date of Birth/Sex: Treating RN: [birth year] (71 y.o. Sue Lush Primary Care Provider: [name] Other Clinician: Referring Provider: Treating Provider/Extender: Marin Olp in Treatment: 3 Subjective Chief Complaint Information obtained from Patient 10/20/2021; Left lower extremity wound due to trauma History of Present Illness (HPI) Admission 10/20/2021 Ms. Adalei Novell is a 71 year old female with a past medical history of chronic kidney disease stage IV, type 2  diabetes on insulin with last hemoglobin A1c of 6.8, peripheral arterial disease, chronic venous insufficiency and lymphedema that presents to the clinic for a 5 to 76-month history of nonhealing ulcer to her left lower extremity. She states that she hit her leg against a walker and developed a wound. She has home health and they have been dressing this with silver alginate daily. She reports stability in the wound. She denies signs of infection. 1/23; patient presents for follow-up. She has been using Hydrofera Blue with dressing changes. She has no issues or complaints today. She denies signs of infection. 2/6; patient presents for follow-up. She has been using Hydrofera Blue with dressing changes with no issues. She denies signs of infection. Patient History Information obtained from Patient. Social History Current every day smoker - half pack daily, Marital Status - Separated, Alcohol Use - Never, Drug Use - Prior History - recreational when young, Caffeine Use - Daily - soda. Medical History Hematologic/Lymphatic Patient has history of Anemia Denies history of Hemophilia, Human Immunodeficiency Virus, Lymphedema, Sickle Cell Disease Respiratory Patient has history of Asthma, Chronic Obstructive Pulmonary Disease (COPD) Denies history of Aspiration, Pneumothorax, Sleep Apnea, Tuberculosis Cardiovascular Patient has history of Congestive Heart Failure, Deep Vein Thrombosis - hx DVT Rt leg, Hypertension, Peripheral Arterial Disease Denies history of Angina Endocrine Patient has history of Type II Diabetes Musculoskeletal Patient has history of Gout, Osteoarthritis Medical A Surgical History Notes nd Gastrointestinal fatty liver Genitourinary CKD Objective Constitutional respirations regular, non-labored and within target range for patient.. Vitals Time Taken: 1:40 PM, Height: 63 in, Weight: 191 lbs, BMI: 33.8, Temperature: 97.9 F, Pulse: 89 bpm, Respiratory Rate: 20 breaths/min,  Blood Pressure: 166/78 mmHg, Capillary Blood Glucose: 261 mg/dl. Cardiovascular 2+ dorsalis pedis/posterior tibialis pulses. Psychiatric pleasant and cooperative. General Notes: Left lower extremity: T the proximal medial aspect below the knee she has an open wound with granulation tissue and scant nonviable tissue. o No signs of surrounding infection. 2+ pitting edema to the knee. Integumentary (Hair, Skin) Wound #1 status is Open. Original cause of wound was Trauma. The date acquired was: 04/07/2021. The wound has been in treatment 3 weeks. The wound is located on the Left,Medial Lower Leg. The wound measures 2.8cm length x 1.2cm width x 0.1cm depth; 2.639cm^2 area and 0.264cm^3 volume. There is Fat Layer (Subcutaneous Tissue) exposed. There is no tunneling or undermining noted. There is a medium amount of serosanguineous drainage noted. The wound margin is flat and intact. There is large (67-100%) red, pink granulation within the wound bed. There is a small (1-33%) amount of necrotic tissue within the wound bed including Adherent Slough. Assessment Active Problems ICD-10 Non-pressure chronic ulcer of other part of left lower leg with fat layer exposed Chronic kidney disease, stage 4 (severe) Peripheral vascular disease, unspecified Venous insufficiency (chronic) (peripheral) Lymphedema, not elsewhere classified Type 2 diabetes mellitus with other skin ulcer Patient's wound has shown improvement in  appearance since last clinic visit. I debrided nonviable tissue. No signs of surrounding infection. I used silver nitrate to the hyper granulated areas. I recommended continuing with Hydrofera Blue every other day with dressing changes. Patient has home health. Follow-up in 2 weeks. Procedures Wound #1 Pre-procedure diagnosis of Wound #1 is a Venous Leg Ulcer located on the Left,Medial Lower Leg .Severity of Tissue Pre Debridement is: Fat layer exposed. There was a Excisional  Skin/Subcutaneous Tissue Debridement with a total area of 3.36 sq cm performed by Kalman Shan, DO. With the following instrument(s): Curette to remove Viable and Non-Viable tissue/material. Material removed includes Subcutaneous Tissue, Slough, Skin: Dermis, Skin: Epidermis, and Fibrin/Exudate after achieving pain control using Lidocaine 4% Topical Solution. A time out was conducted at 14:00, prior to the start of the procedure. A Minimum amount of bleeding was controlled with Pressure. The procedure was tolerated well with a pain level of 0 throughout and a pain level of 0 following the procedure. Post Debridement Measurements: 2.8cm length x 1.2cm width x 0.1cm depth; 0.264cm^3 volume. Character of Wound/Ulcer Post Debridement is improved. Severity of Tissue Post Debridement is: Fat layer exposed. Post procedure Diagnosis Wound #1: Same as Pre-Procedure Plan Follow-up Appointments: Return Appointment in 2 weeks. - Dr. Heber [place] and Tammi Klippel, RN Bathing/ Shower/ Hygiene: May shower and wash wound with soap and water. Edema Control - Lymphedema / SCD / Other: Elevate legs to the level of the heart or above for 30 minutes daily and/or when sitting, a frequency of: - 3-4 times a day throughout the day. Avoid standing for long periods of time. Moisturize legs daily. - every night before bed. Home Health: New wound care orders this week; continue Home Health for wound care. May utilize formulary equivalent dressing for wound treatment orders unless otherwise specified. - Home health to continue twice a week dressing changes. Frequency change has decreased to every other day. Other Home Health Orders/Instructions: - [facility] WOUND #1: - Lower Leg Wound Laterality: Left, Medial Cleanser: Wound Cleanser (Home Health) Every Other Day/30 Days Discharge Instructions: Cleanse the wound with wound cleanser prior to applying a clean dressing using gauze sponges, not tissue or cotton balls. Peri-Wound Care:  Skin Prep [facility]) Every Other Day/30 Days Discharge Instructions: Use skin prep as directed Prim Dressing: Hydrofera Blue Classic Foam, 4x4 in [facility]) Every Other Day/30 Days ary Discharge Instructions: Moisten with saline prior to applying to wound bed Prim Dressing: Santyl Ointment Every Other Day/30 Days ary Discharge Instructions: **IN CLINIC ONLY** Secondary Dressing: Bordered Gauze, 4x4 in [facility]) Every Other Day/30 Days Discharge Instructions: Apply over primary dressing as directed. 1. Hydrofera Blue 2. None for sharp debridement 3. Follow-up in 2 weeks Electronic Signature(s) Signed: 11/13/2021 2:27:39 PM By: Kalman Shan DO Entered By: Kalman Shan on 11/13/2021 14:26:51 -------------------------------------------------------------------------------- HxROS Details Patient Name: Date of Service: [name], Jasmynn P. 11/13/2021 1:30 PM Medical Record Number: [id number] Patient Account Number: [id number] Date of Birth/Sex: Treating RN: 1951/06/19 (71 y.o. Sue Lush Primary Care Provider: [name] Other Clinician: Referring Provider: Treating Provider/Extender: Marin Olp in Treatment: 3 Information Obtained From Patient Hematologic/Lymphatic Medical History: Positive for: Anemia Negative for: Hemophilia; Human Immunodeficiency Virus; Lymphedema; Sickle Cell Disease Respiratory Medical History: Positive for: Asthma; Chronic Obstructive Pulmonary Disease (COPD) Negative for: Aspiration; Pneumothorax; Sleep Apnea; Tuberculosis Cardiovascular Medical History: Positive for: Congestive Heart Failure; Deep Vein Thrombosis - hx DVT Rt leg; Hypertension; Peripheral Arterial Disease Negative for: Angina Gastrointestinal Medical History: Past Medical History  Notes: fatty liver Endocrine Medical History: Positive for: Type II Diabetes Time with diabetes: 10+yrs Treated with: Insulin Blood sugar tested every  day: Yes Tested : 2 Genitourinary Medical History: Past Medical History Notes: CKD Musculoskeletal Medical History: Positive for: Gout; Osteoarthritis Immunizations Pneumococcal Vaccine: Received Pneumococcal Vaccination: No Implantable Devices None Family and Social History Current every day smoker - half pack daily; Marital Status - Separated; Alcohol Use: Never; Drug Use: Prior History - recreational when young; Caffeine Use: Daily - soda; Financial Concerns: No; Food, Clothing or Shelter Needs: No; Support System Lacking: No; Transportation Concerns: No Electronic Signature(s) Signed: 11/13/2021 2:27:39 PM By: Kalman Shan DO Signed: 11/13/2021 4:43:35 PM By: Lorrin Jackson Entered By: Kalman Shan on 11/13/2021 14:21:39 -------------------------------------------------------------------------------- [place] Details Patient Name: Date of Service: [name], [place] P. 11/13/2021 Medical Record Number: [id number] Patient Account Number: [id number] Date of Birth/Sex: Treating RN: [birth year] (71 y.o. Helene Shoe, Tammi Klippel Primary Care Provider: [name] Other Clinician: Referring Provider: Treating Provider/Extender: Marin Olp in Treatment: 3 Diagnosis Coding ICD-10 Codes Code Description [phone] Non-pressure chronic ulcer of other part of left lower leg with fat layer exposed N18.4 Chronic kidney disease, stage 4 (severe) I73.9 Peripheral vascular disease, unspecified I87.2 Venous insufficiency (chronic) (peripheral) I89.0 Lymphedema, not elsewhere classified E11.622 Type 2 diabetes mellitus with other skin ulcer Facility Procedures CPT4 Code: [id number] Description: 61607 - DEB SUBQ TISSUE 20 SQ CM/< ICD-10 Diagnosis Description L97.822 Non-pressure chronic ulcer of other part of left lower leg with fat layer expo Modifier: sed Quantity: 1 Physician Procedures : CPT4 Code Description Modifier [id number] 11042 - WC PHYS SUBQ TISS 20 SQ CM  ICD-10 Diagnosis Description L97.822 Non-pressure chronic ulcer of other part of left lower leg with fat layer exposed Quantity: 1 Electronic Signature(s) Signed: 11/13/2021 2:27:39 PM By: Kalman Shan DO Entered By: Kalman Shan on 11/13/2021 14:26:57

## 2021-11-20 ENCOUNTER — Encounter (HOSPITAL_COMMUNITY): Payer: Self-pay

## 2021-11-21 ENCOUNTER — Emergency Department (HOSPITAL_COMMUNITY): Payer: Medicare Other

## 2021-11-21 ENCOUNTER — Other Ambulatory Visit: Payer: Self-pay

## 2021-11-21 ENCOUNTER — Inpatient Hospital Stay (HOSPITAL_COMMUNITY)
Admission: EM | Admit: 2021-11-21 | Discharge: 2021-12-08 | DRG: 264 | Disposition: A | Discharge status: Home Health | Payer: Medicare Other | Attending: Internal Medicine | Admitting: Internal Medicine

## 2021-11-21 DIAGNOSIS — Z888 Allergy status to other drugs, medicaments and biological substances status: Secondary | ICD-10-CM

## 2021-11-21 DIAGNOSIS — R918 Other nonspecific abnormal finding of lung field: Secondary | ICD-10-CM

## 2021-11-21 DIAGNOSIS — J9601 Acute respiratory failure with hypoxia: Secondary | ICD-10-CM

## 2021-11-21 DIAGNOSIS — E1140 Type 2 diabetes mellitus with diabetic neuropathy, unspecified: Secondary | ICD-10-CM | POA: Diagnosis present

## 2021-11-21 DIAGNOSIS — Z992 Dependence on renal dialysis: Secondary | ICD-10-CM

## 2021-11-21 DIAGNOSIS — Z79891 Long term (current) use of opiate analgesic: Secondary | ICD-10-CM

## 2021-11-21 DIAGNOSIS — I132 Hypertensive heart and chronic kidney disease with heart failure and with stage 5 chronic kidney disease, or end stage renal disease: Principal | ICD-10-CM | POA: Diagnosis present

## 2021-11-21 DIAGNOSIS — G894 Chronic pain syndrome: Secondary | ICD-10-CM | POA: Diagnosis present

## 2021-11-21 DIAGNOSIS — I872 Venous insufficiency (chronic) (peripheral): Secondary | ICD-10-CM | POA: Diagnosis present

## 2021-11-21 DIAGNOSIS — N179 Acute kidney failure, unspecified: Secondary | ICD-10-CM | POA: Diagnosis present

## 2021-11-21 DIAGNOSIS — L8990 Pressure ulcer of unspecified site, unspecified stage: Secondary | ICD-10-CM | POA: Insufficient documentation

## 2021-11-21 DIAGNOSIS — Z833 Family history of diabetes mellitus: Secondary | ICD-10-CM

## 2021-11-21 DIAGNOSIS — N281 Cyst of kidney, acquired: Secondary | ICD-10-CM | POA: Diagnosis present

## 2021-11-21 DIAGNOSIS — E440 Moderate protein-calorie malnutrition: Secondary | ICD-10-CM | POA: Insufficient documentation

## 2021-11-21 DIAGNOSIS — L89322 Pressure ulcer of left buttock, stage 2: Secondary | ICD-10-CM | POA: Diagnosis not present

## 2021-11-21 DIAGNOSIS — R55 Syncope and collapse: Principal | ICD-10-CM | POA: Diagnosis present

## 2021-11-21 DIAGNOSIS — Z20822 Contact with and (suspected) exposure to covid-19: Secondary | ICD-10-CM | POA: Diagnosis present

## 2021-11-21 DIAGNOSIS — E876 Hypokalemia: Secondary | ICD-10-CM | POA: Diagnosis not present

## 2021-11-21 DIAGNOSIS — I493 Ventricular premature depolarization: Secondary | ICD-10-CM | POA: Diagnosis present

## 2021-11-21 DIAGNOSIS — N289 Disorder of kidney and ureter, unspecified: Secondary | ICD-10-CM

## 2021-11-21 DIAGNOSIS — Z6837 Body mass index (BMI) 37.0-37.9, adult: Secondary | ICD-10-CM

## 2021-11-21 DIAGNOSIS — E1122 Type 2 diabetes mellitus with diabetic chronic kidney disease: Secondary | ICD-10-CM | POA: Diagnosis present

## 2021-11-21 DIAGNOSIS — I82621 Acute embolism and thrombosis of deep veins of right upper extremity: Secondary | ICD-10-CM | POA: Diagnosis not present

## 2021-11-21 DIAGNOSIS — F17210 Nicotine dependence, cigarettes, uncomplicated: Secondary | ICD-10-CM | POA: Diagnosis present

## 2021-11-21 DIAGNOSIS — N2581 Secondary hyperparathyroidism of renal origin: Secondary | ICD-10-CM | POA: Diagnosis present

## 2021-11-21 DIAGNOSIS — Z7985 Long-term (current) use of injectable non-insulin antidiabetic drugs: Secondary | ICD-10-CM

## 2021-11-21 DIAGNOSIS — G4733 Obstructive sleep apnea (adult) (pediatric): Secondary | ICD-10-CM | POA: Diagnosis present

## 2021-11-21 DIAGNOSIS — R064 Hyperventilation: Secondary | ICD-10-CM

## 2021-11-21 DIAGNOSIS — Z79890 Hormone replacement therapy (postmenopausal): Secondary | ICD-10-CM

## 2021-11-21 DIAGNOSIS — L89312 Pressure ulcer of right buttock, stage 2: Secondary | ICD-10-CM | POA: Diagnosis not present

## 2021-11-21 DIAGNOSIS — F419 Anxiety disorder, unspecified: Secondary | ICD-10-CM | POA: Diagnosis present

## 2021-11-21 DIAGNOSIS — N186 End stage renal disease: Secondary | ICD-10-CM | POA: Diagnosis present

## 2021-11-21 DIAGNOSIS — J441 Chronic obstructive pulmonary disease with (acute) exacerbation: Secondary | ICD-10-CM

## 2021-11-21 DIAGNOSIS — I5023 Acute on chronic systolic (congestive) heart failure: Secondary | ICD-10-CM | POA: Diagnosis not present

## 2021-11-21 DIAGNOSIS — Z809 Family history of malignant neoplasm, unspecified: Secondary | ICD-10-CM

## 2021-11-21 DIAGNOSIS — N189 Chronic kidney disease, unspecified: Secondary | ICD-10-CM

## 2021-11-21 DIAGNOSIS — L03113 Cellulitis of right upper limb: Secondary | ICD-10-CM | POA: Diagnosis not present

## 2021-11-21 DIAGNOSIS — D631 Anemia in chronic kidney disease: Secondary | ICD-10-CM | POA: Diagnosis present

## 2021-11-21 DIAGNOSIS — M109 Gout, unspecified: Secondary | ICD-10-CM | POA: Diagnosis present

## 2021-11-21 DIAGNOSIS — E1151 Type 2 diabetes mellitus with diabetic peripheral angiopathy without gangrene: Secondary | ICD-10-CM | POA: Diagnosis present

## 2021-11-21 DIAGNOSIS — Z8249 Family history of ischemic heart disease and other diseases of the circulatory system: Secondary | ICD-10-CM

## 2021-11-21 DIAGNOSIS — Z794 Long term (current) use of insulin: Secondary | ICD-10-CM

## 2021-11-21 DIAGNOSIS — D509 Iron deficiency anemia, unspecified: Secondary | ICD-10-CM | POA: Diagnosis present

## 2021-11-21 DIAGNOSIS — M1990 Unspecified osteoarthritis, unspecified site: Secondary | ICD-10-CM | POA: Diagnosis present

## 2021-11-21 DIAGNOSIS — K5900 Constipation, unspecified: Secondary | ICD-10-CM | POA: Diagnosis present

## 2021-11-21 DIAGNOSIS — E890 Postprocedural hypothyroidism: Secondary | ICD-10-CM | POA: Diagnosis present

## 2021-11-21 DIAGNOSIS — N185 Chronic kidney disease, stage 5: Secondary | ICD-10-CM | POA: Diagnosis present

## 2021-11-21 DIAGNOSIS — R531 Weakness: Secondary | ICD-10-CM

## 2021-11-21 DIAGNOSIS — K219 Gastro-esophageal reflux disease without esophagitis: Secondary | ICD-10-CM | POA: Diagnosis present

## 2021-11-21 DIAGNOSIS — Z79899 Other long term (current) drug therapy: Secondary | ICD-10-CM

## 2021-11-21 DIAGNOSIS — Z885 Allergy status to narcotic agent status: Secondary | ICD-10-CM

## 2021-11-21 DIAGNOSIS — E785 Hyperlipidemia, unspecified: Secondary | ICD-10-CM | POA: Diagnosis present

## 2021-11-21 DIAGNOSIS — I69334 Monoplegia of upper limb following cerebral infarction affecting left non-dominant side: Secondary | ICD-10-CM

## 2021-11-21 DIAGNOSIS — Z7189 Other specified counseling: Secondary | ICD-10-CM

## 2021-11-21 DIAGNOSIS — C3432 Malignant neoplasm of lower lobe, left bronchus or lung: Secondary | ICD-10-CM | POA: Diagnosis present

## 2021-11-21 DIAGNOSIS — D649 Anemia, unspecified: Secondary | ICD-10-CM

## 2021-11-21 DIAGNOSIS — Z66 Do not resuscitate: Secondary | ICD-10-CM | POA: Diagnosis not present

## 2021-11-21 LAB — COMPREHENSIVE METABOLIC PANEL
ALT: 15 U/L (ref 0–44)
AST: 20 U/L (ref 15–41)
Albumin: 3 g/dL — ABNORMAL LOW (ref 3.5–5.0)
Alkaline Phosphatase: 55 U/L (ref 38–126)
Anion gap: 16 — ABNORMAL HIGH (ref 5–15)
BUN: 191 mg/dL — ABNORMAL HIGH (ref 8–23)
CO2: 25 mmol/L (ref 22–32)
Calcium: 9.3 mg/dL (ref 8.9–10.3)
Chloride: 97 mmol/L — ABNORMAL LOW (ref 98–111)
Creatinine, Ser: 3.12 mg/dL — ABNORMAL HIGH (ref 0.44–1.00)
GFR, Estimated: 15 mL/min — ABNORMAL LOW (ref >60)
Glucose, Bld: 199 mg/dL — ABNORMAL HIGH (ref 70–99)
Potassium: 3 mmol/L — ABNORMAL LOW (ref 3.5–5.1)
Sodium: 138 mmol/L (ref 135–145)
Total Bilirubin: 0.5 mg/dL (ref 0.3–1.2)
Total Protein: 6.4 g/dL — ABNORMAL LOW (ref 6.5–8.1)

## 2021-11-21 LAB — CBC WITH DIFFERENTIAL/PLATELET
Abs Immature Granulocytes: 0.09 10*3/uL — ABNORMAL HIGH (ref 0.00–0.07)
Basophils Absolute: 0 10*3/uL (ref 0.0–0.1)
Basophils Relative: 0 %
Eosinophils Absolute: 0.1 10*3/uL (ref 0.0–0.5)
Eosinophils Relative: 1 %
HCT: 26.6 % — ABNORMAL LOW (ref 36.0–46.0)
Hemoglobin: 7.9 g/dL — ABNORMAL LOW (ref 12.0–15.0)
Immature Granulocytes: 1 %
Lymphocytes Relative: 8 %
Lymphs Abs: 0.9 10*3/uL (ref 0.7–4.0)
MCH: 26.7 pg (ref 26.0–34.0)
MCHC: 29.7 g/dL — ABNORMAL LOW (ref 30.0–36.0)
MCV: 89.9 fL (ref 80.0–100.0)
Monocytes Absolute: 0.6 10*3/uL (ref 0.1–1.0)
Monocytes Relative: 5 %
Neutro Abs: 9.3 10*3/uL — ABNORMAL HIGH (ref 1.7–7.7)
Neutrophils Relative %: 85 %
Platelets: 265 10*3/uL (ref 150–400)
RBC: 2.96 MIL/uL — ABNORMAL LOW (ref 3.87–5.11)
RDW: 23.5 % — ABNORMAL HIGH (ref 11.5–15.5)
WBC: 11 10*3/uL — ABNORMAL HIGH (ref 4.0–10.5)
nRBC: 0 % (ref 0.0–0.2)

## 2021-11-21 LAB — URINALYSIS, ROUTINE W REFLEX MICROSCOPIC
Bilirubin Urine: NEGATIVE
Glucose, UA: NEGATIVE mg/dL
Hgb urine dipstick: NEGATIVE
Ketones, ur: NEGATIVE mg/dL
Leukocytes,Ua: NEGATIVE
Nitrite: NEGATIVE
Protein, ur: NEGATIVE mg/dL
Specific Gravity, Urine: 1.01 (ref 1.005–1.030)
pH: 5 (ref 5.0–8.0)

## 2021-11-21 LAB — POC OCCULT BLOOD, ED: Fecal Occult Bld: NEGATIVE

## 2021-11-21 LAB — LIPASE, BLOOD: Lipase: 65 U/L — ABNORMAL HIGH (ref 11–51)

## 2021-11-21 LAB — TROPONIN I (HIGH SENSITIVITY)
Troponin I (High Sensitivity): 80 ng/L — ABNORMAL HIGH (ref <18)
Troponin I (High Sensitivity): 84 ng/L — ABNORMAL HIGH (ref <18)

## 2021-11-21 LAB — CBG MONITORING, ED: Glucose-Capillary: 174 mg/dL — ABNORMAL HIGH (ref 70–99)

## 2021-11-21 IMAGING — CR DG CHEST 2V
2 series · 2 of 2 positions shown · non-contrast
Comparison: 04/08/2019, 08/10/2019

CLINICAL DATA: Preop evaluation for upcoming back surgery

EXAM:
CHEST - 2 VIEW

[w chest pa]
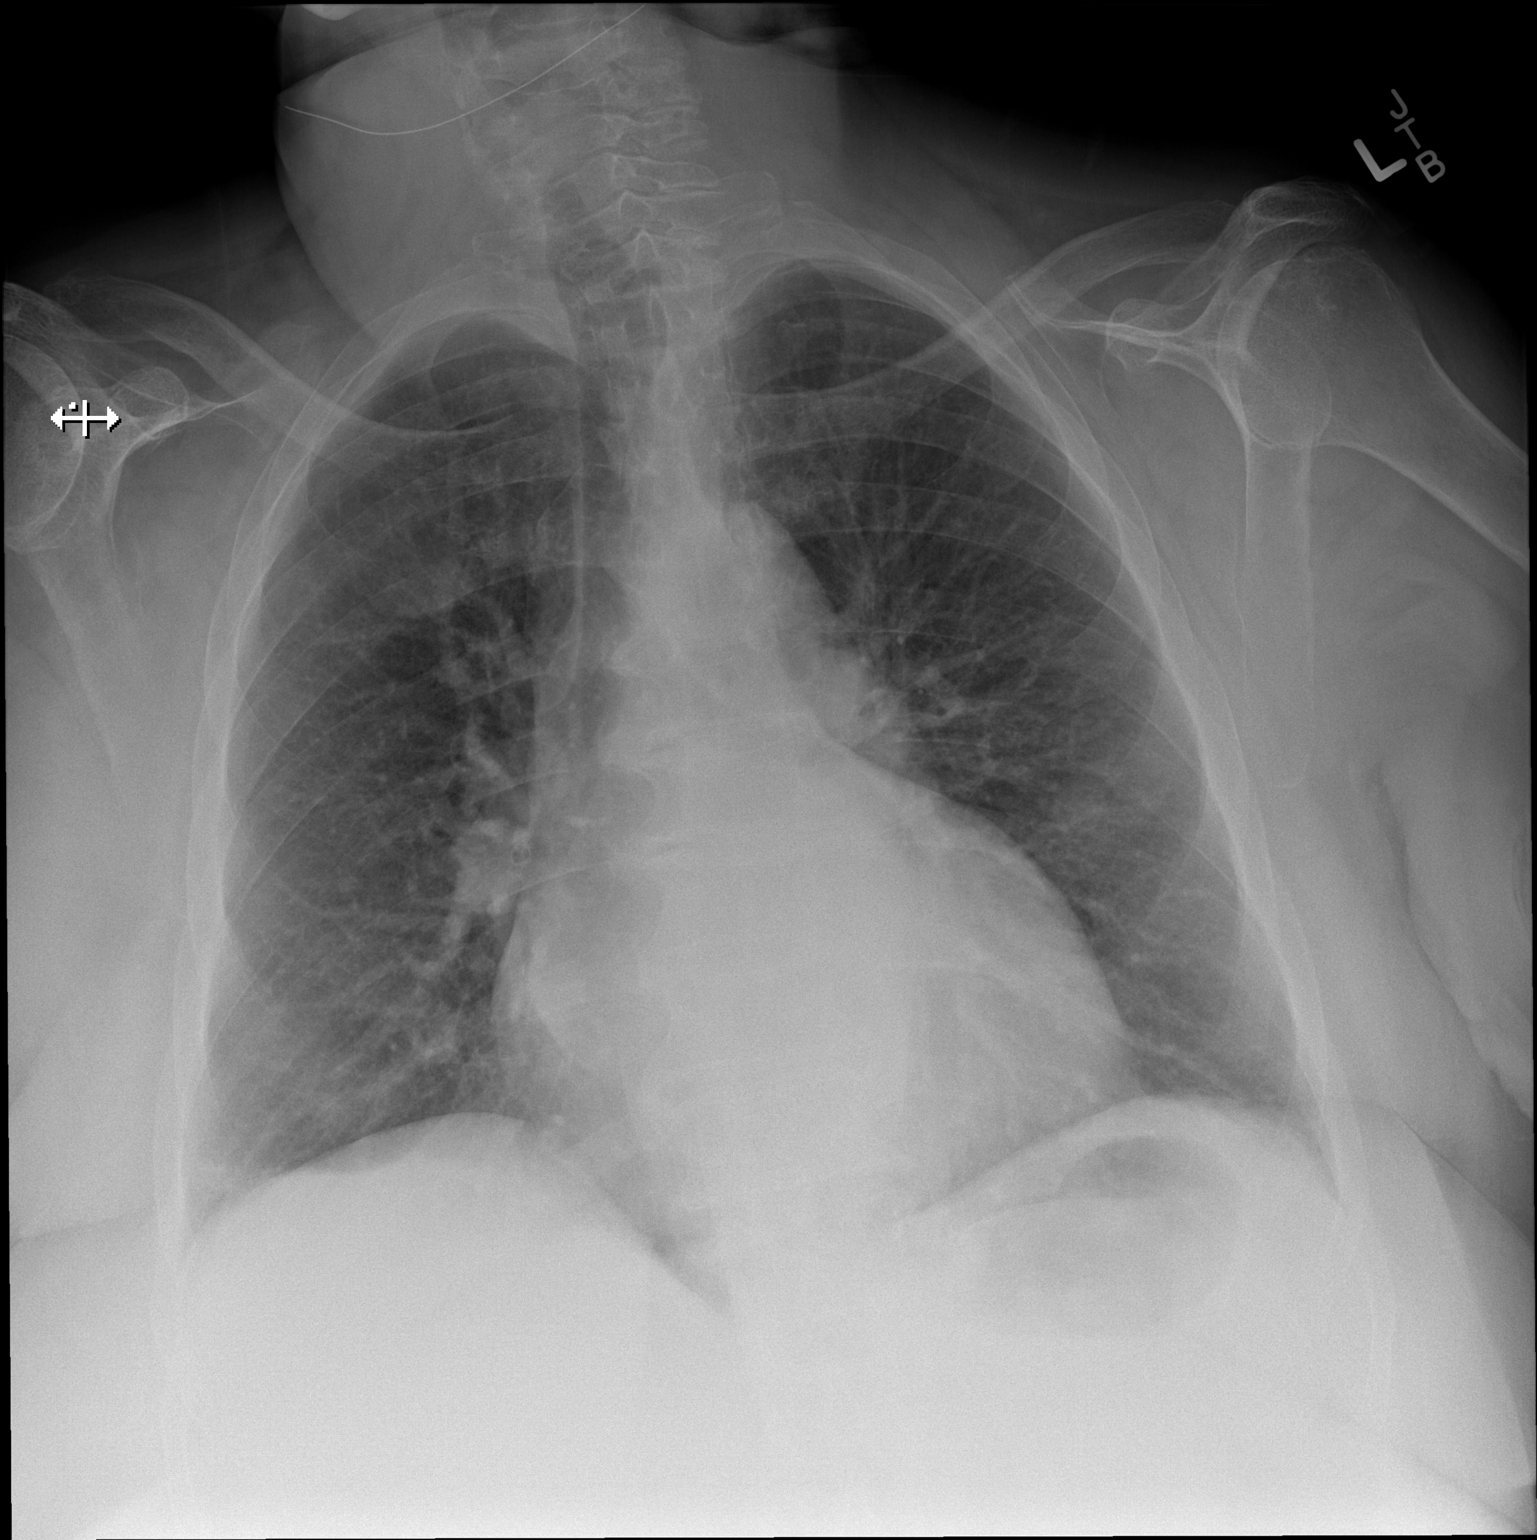

[w chest lat]
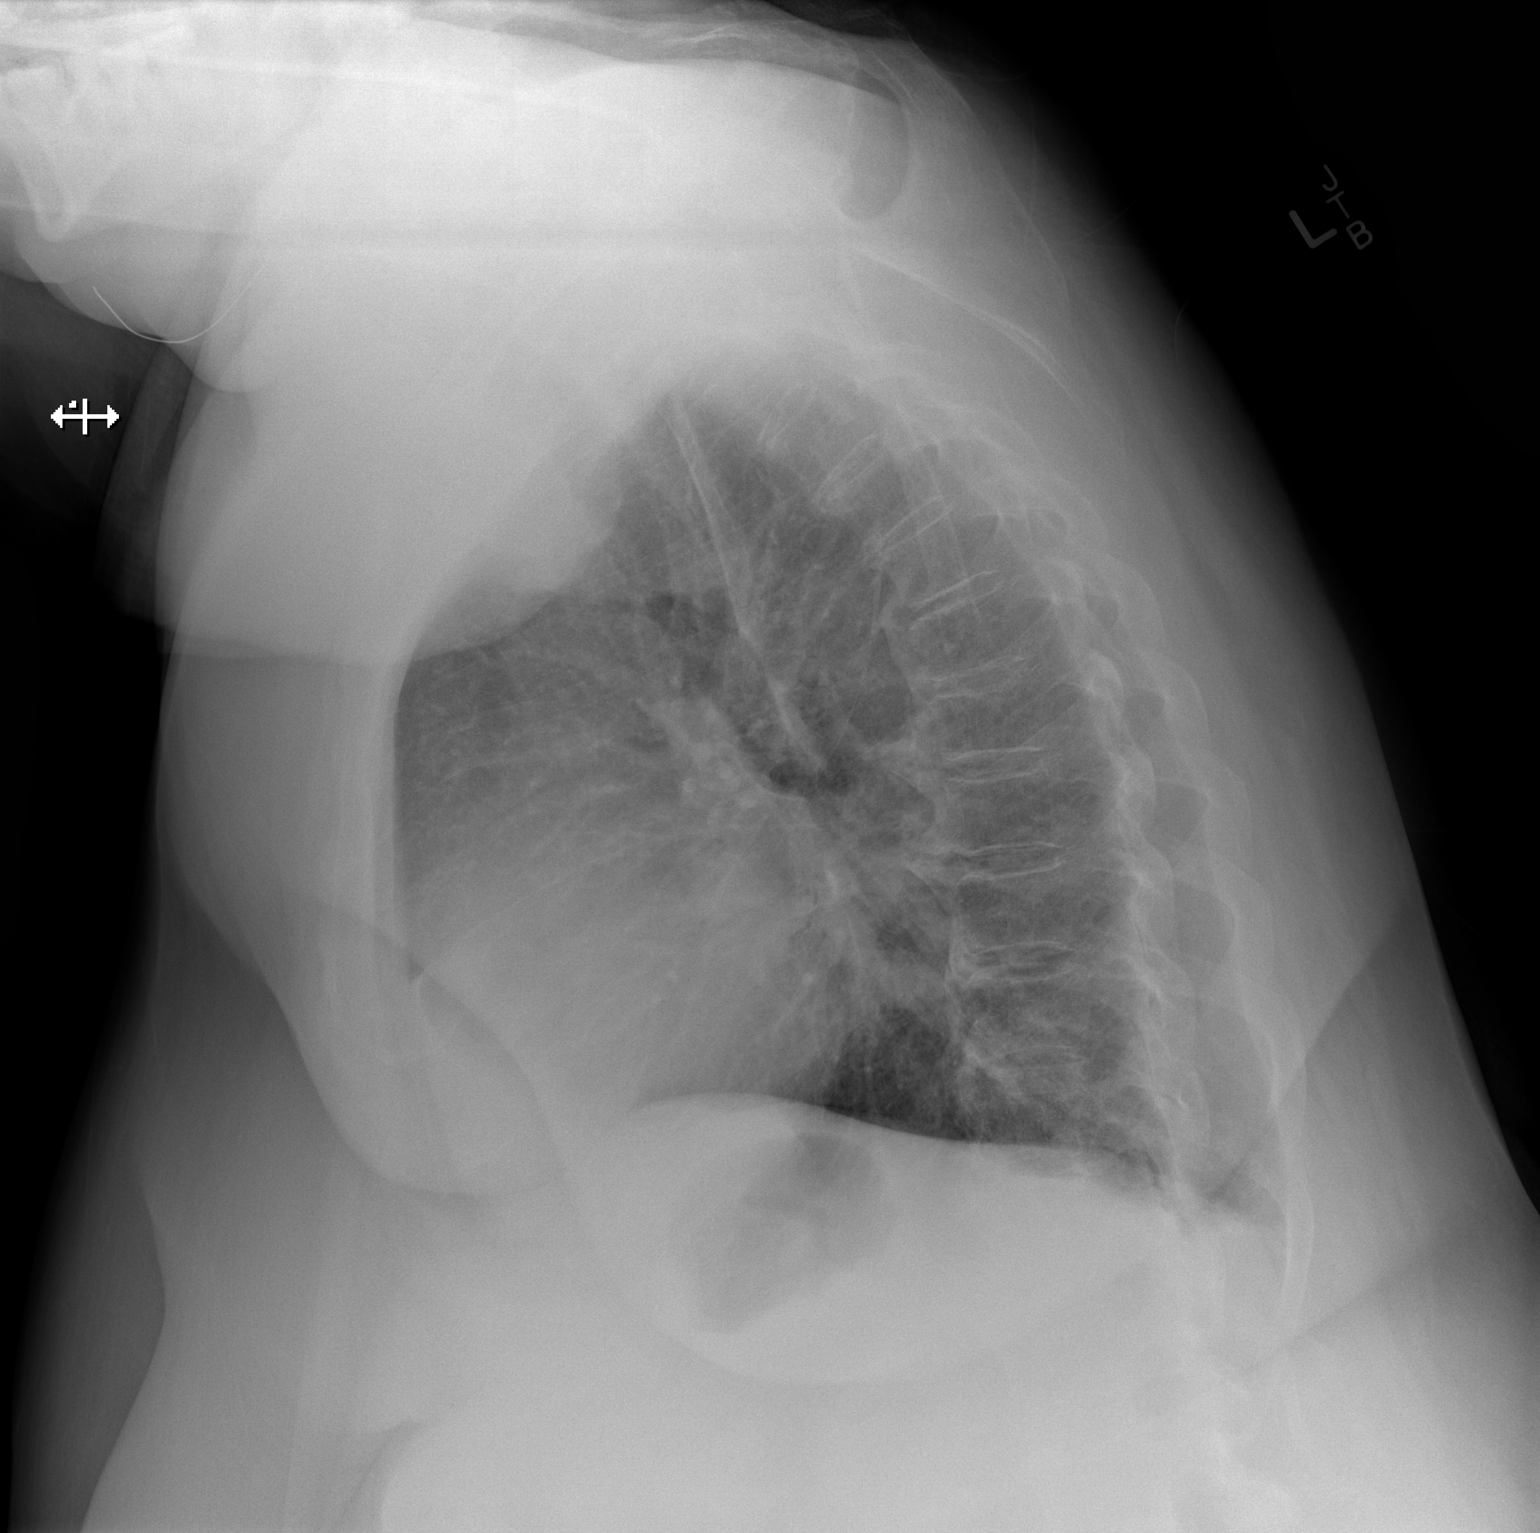

[2 of 2 positions shown; findings below may reference images not displayed]

FINDINGS: Cardiac shadow is stable. The known left lower lobe nodule is again
seen and stable. No focal infiltrate or sizable effusion is seen.
Degenerative changes of the thoracic spine are noted.
IMPRESSION: No acute abnormality noted.  Stable chronic changes as described.

## 2021-11-21 MED ORDER — ACETAMINOPHEN 325 MG PO TABS
650.0000 mg | ORAL_TABLET | Freq: Four times a day (QID) | ORAL | Status: DC | PRN
  Administered 2021-11-22 – 2021-12-04 (×9): 650 mg via ORAL
  Filled 2021-11-21 (×12): qty 2

## 2021-11-21 MED ORDER — PANTOPRAZOLE SODIUM 40 MG IV SOLR
40.0000 mg | Freq: Every day | INTRAVENOUS | Status: DC
  Administered 2021-11-21 – 2021-11-22 (×2): 40 mg via INTRAVENOUS
  Filled 2021-11-21 (×2): qty 10

## 2021-11-21 MED ORDER — METOCLOPRAMIDE HCL 5 MG PO TABS
5.0000 mg | ORAL_TABLET | Freq: Three times a day (TID) | ORAL | Status: DC
  Administered 2021-11-21 – 2021-12-08 (×45): 5 mg via ORAL
  Filled 2021-11-21 (×49): qty 1

## 2021-11-21 MED ORDER — POTASSIUM CHLORIDE 20 MEQ PO PACK
20.0000 meq | PACK | Freq: Once | ORAL | Status: AC
  Administered 2021-11-21: 20 meq via ORAL
  Filled 2021-11-21: qty 1

## 2021-11-21 MED ORDER — HEPARIN SODIUM (PORCINE) 5000 UNIT/ML IJ SOLN
5000.0000 [IU] | Freq: Three times a day (TID) | INTRAMUSCULAR | Status: DC
  Administered 2021-11-21 – 2021-12-08 (×44): 5000 [IU] via SUBCUTANEOUS
  Filled 2021-11-21 (×45): qty 1

## 2021-11-21 MED ORDER — LEVOTHYROXINE SODIUM 100 MCG PO TABS
200.0000 ug | ORAL_TABLET | Freq: Every day | ORAL | Status: DC
  Administered 2021-11-22 – 2021-12-08 (×16): 200 ug via ORAL
  Filled 2021-11-21 (×17): qty 2

## 2021-11-21 MED ORDER — INSULIN ASPART 100 UNIT/ML IJ SOLN
0.0000 [IU] | Freq: Three times a day (TID) | INTRAMUSCULAR | Status: DC
  Administered 2021-11-22: 3 [IU] via SUBCUTANEOUS
  Administered 2021-11-22: 14:00:00 2 [IU] via SUBCUTANEOUS
  Administered 2021-11-23: 17:00:00 7 [IU] via SUBCUTANEOUS
  Administered 2021-11-23: 2 [IU] via SUBCUTANEOUS
  Administered 2021-11-23 – 2021-11-24 (×2): 3 [IU] via SUBCUTANEOUS
  Administered 2021-11-24: 08:00:00 1 [IU] via SUBCUTANEOUS
  Administered 2021-11-24 – 2021-11-25 (×2): 2 [IU] via SUBCUTANEOUS
  Administered 2021-11-25: 08:00:00 1 [IU] via SUBCUTANEOUS
  Administered 2021-11-25 – 2021-11-26 (×3): 2 [IU] via SUBCUTANEOUS
  Administered 2021-11-27: 3 [IU] via SUBCUTANEOUS
  Administered 2021-11-27: 2 [IU] via SUBCUTANEOUS
  Administered 2021-11-27: 17:00:00 5 [IU] via SUBCUTANEOUS
  Administered 2021-11-28: 7 [IU] via SUBCUTANEOUS
  Administered 2021-11-28: 12:00:00 2 [IU] via SUBCUTANEOUS
  Administered 2021-11-28: 5 [IU] via SUBCUTANEOUS
  Administered 2021-11-29: 7 [IU] via SUBCUTANEOUS
  Administered 2021-11-29 – 2021-11-30 (×3): 2 [IU] via SUBCUTANEOUS
  Administered 2021-11-30: 1 [IU] via SUBCUTANEOUS
  Administered 2021-11-30: 3 [IU] via SUBCUTANEOUS
  Administered 2021-12-01: 17:00:00 2 [IU] via SUBCUTANEOUS
  Administered 2021-12-01 – 2021-12-02 (×3): 1 [IU] via SUBCUTANEOUS
  Administered 2021-12-02: 2 [IU] via SUBCUTANEOUS
  Administered 2021-12-03: 18:00:00 3 [IU] via SUBCUTANEOUS
  Administered 2021-12-03: 09:00:00 1 [IU] via SUBCUTANEOUS
  Administered 2021-12-03: 12:00:00 2 [IU] via SUBCUTANEOUS
  Administered 2021-12-04: 5 [IU] via SUBCUTANEOUS
  Administered 2021-12-04: 2 [IU] via SUBCUTANEOUS
  Administered 2021-12-04: 3 [IU] via SUBCUTANEOUS
  Administered 2021-12-05: 2 [IU] via SUBCUTANEOUS
  Administered 2021-12-05: 3 [IU] via SUBCUTANEOUS
  Administered 2021-12-06: 2 [IU] via SUBCUTANEOUS
  Administered 2021-12-06: 3 [IU] via SUBCUTANEOUS
  Administered 2021-12-06: 1 [IU] via SUBCUTANEOUS
  Administered 2021-12-07: 3 [IU] via SUBCUTANEOUS

## 2021-11-21 MED ORDER — ACETAMINOPHEN 650 MG RE SUPP: 650.0000 mg | Freq: Four times a day (QID) | RECTAL | Status: DC | PRN

## 2021-11-21 MED ORDER — IPRATROPIUM-ALBUTEROL 0.5-2.5 (3) MG/3ML IN SOLN
3.0000 mL | RESPIRATORY_TRACT | Status: DC | PRN
  Administered 2021-11-26: 3 mL via RESPIRATORY_TRACT
  Filled 2021-11-21: qty 3

## 2021-11-21 MED ORDER — CARVEDILOL 12.5 MG PO TABS
12.5000 mg | ORAL_TABLET | Freq: Two times a day (BID) | ORAL | Status: DC
  Administered 2021-11-21 – 2021-12-08 (×27): 12.5 mg via ORAL
  Filled 2021-11-21: qty 4
  Filled 2021-11-21 (×25): qty 1
  Filled 2021-11-21: qty 4
  Filled 2021-11-21 (×5): qty 1

## 2021-11-21 NOTE — ED Triage Notes (Addendum)
EMS stated, home health tech seen her on her potty chair and it looked like she had a seizure and threw up and it looked like it again. EMS stated, probably vagaled while she was on the potty. Pt stated, abdominal pain for 3 to 4 days. No neuro deficients.   CBG 297  20 g left AC

## 2021-11-21 NOTE — ED Triage Notes (Signed)
Pt. Stated, my pain has eased up, Im just cold

## 2021-11-21 NOTE — ED Provider Notes (Signed)
[facility] EMERGENCY DEPARTMENT Provider Note   CSN: [id number] Arrival date & time: 11/21/21  1330     History  Chief Complaint  Patient presents with   Abdominal Pain   Seizures    [name] Mcglory Ilagan is a 71 y.o. female.   Abdominal Pain Associated symptoms: nausea and vomiting   Associated symptoms: no shortness of breath   Seizures Patient presents abdominal pain and syncope.  Reportedly has had abdominal pain for the last few days.  Was trying have a bowel movement and then passed out.  Reportedly vomited.  Question of seizure activity.  Patient does not remember exactly what happened.  Denies blood in the stool.  Previous history of chronic kidney disease.   Past Medical History:  Diagnosis Date   Anemia    Arthritis    Asthma    Chronic diastolic (congestive) heart failure (HCC)    Chronic kidney disease    Chronic pain syndrome    Class 2 obesity due to excess calories with body mass index (BMI) of 36.0 to 36.9 in adult    COPD (chronic obstructive pulmonary disease) (HCC)    Diabetes mellitus without complication (HCC)    Full dentures    GERD (gastroesophageal reflux disease)    Gout    History of hiatal hernia    History of transfusion    Hx MRSA infection    abscess left groin   Hyperlipidemia    Hypertension    Hypothyroid    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HCC)    Peripheral vascular disease (HCC)    Sleep apnea    UNABLE TO TOLERATE C PAP   Stress incontinence    TIA (transient ischemic attack)     X2 NO RESIDUAL PROBLEMS   Wears glasses    Past Surgical History:  Procedure Laterality Date   ABDOMINAL HYSTERECTOMY     BACK SURGERY  5732   BASCILIC VEIN TRANSPOSITION Left 04/05/2020   Procedure: BASILIC VEIN TRANSPOSITION FIRST STAGE;  Surgeon: Angelia Mould, MD;  Location: [place];  Service: Vascular;  Laterality: Left;   COLON SURGERY  1995   DUE TO POLYP   DILATION AND CURETTAGE OF UTERUS     ENTEROSCOPY N/A  07/10/2021   Procedure: ENTEROSCOPY;  Surgeon: Ronnette Juniper, MD;  Location: [facility] ENDOSCOPY;  Service: Gastroenterology;  Laterality: N/A;   ESOPHAGOGASTRODUODENOSCOPY N/A 01/25/2021   Procedure: ESOPHAGOGASTRODUODENOSCOPY (EGD);  Surgeon: Clarene Essex, MD;  Location: [facility] ENDOSCOPY;  Service: Endoscopy;  Laterality: N/A;   FOREARM FRACTURE SURGERY     Left arm   GIVENS CAPSULE STUDY N/A 07/07/2021   Procedure: GIVENS CAPSULE STUDY;  Surgeon: Clarene Essex, MD;  Location: [place];  Service: Endoscopy;  Laterality: N/A;   HERNIA REPAIR     w/ mesh   HOT HEMOSTASIS N/A 07/10/2021   Procedure: HOT HEMOSTASIS (ARGON PLASMA COAGULATION/BICAP);  Surgeon: Ronnette Juniper, MD;  Location: [place];  Service: Gastroenterology;  Laterality: N/A;   INCISION AND DRAINAGE ABSCESS Left 02/04/2013   Procedure: INCISION AND DRAINAGE LEFT BUTTOCK ABSCESS; INCISION AND DRAINAGE LEFT BREAST ABSCESS;  Surgeon: Harl Bowie, MD;  Location: [place];  Service: General;  Laterality: Left;   INCISION AND DRAINAGE ABSCESS N/A 02/12/2013   Procedure: INCISION AND DEBRIDEMENT BUTTOCK WOUND ;  Surgeon: [name]. Georgette Dover, MD;  Location: [place];  Service: General;  Laterality: N/A;   INCISION AND DRAINAGE ABSCESS N/A 02/14/2013   Procedure: INCISION AND DRAINAGE/DRESSING CHANGE;  Surgeon: Harl Bowie,  MD;  Location: [place];  Service: General;  Laterality: N/A;   INCISION AND DRAINAGE ABSCESS N/A 03/21/2015   Procedure: INCISION AND DRAINAGE PUBIC ABSCESS;  Surgeon: Excell Seltzer, MD;  Location: [facility];  Service: General;  Laterality: N/A;   INCISION AND DRAINAGE PERIRECTAL ABSCESS Left 02/10/2013   Procedure: IRRIGATION AND DEBRIDEMENT OF BUTTOCK/PERINEAL ABSCESS;  Surgeon: [name]. Georgette Dover, MD;  Location: [place];  Service: General;  Laterality: Left;   INCISION AND DRAINAGE PERIRECTAL ABSCESS N/A 02/16/2013   Procedure: IRRIGATION AND DEBRIDEMENT PERINEAL ABSCESS;  Surgeon: Zenovia Jarred, MD;  Location: [place];  Service:  General;  Laterality: N/A;   IRRIGATION AND DEBRIDEMENT ABSCESS N/A 02/06/2013   Procedure: IRRIGATION AND DEBRIDEMENT BUTTOCK ABSCESS AND DRESSING CHANGE;  Surgeon: Harl Bowie, MD;  Location: [place];  Service: General;  Laterality: N/A;   IRRIGATION AND DEBRIDEMENT ABSCESS Left 02/08/2013   Procedure: IRRIGATION AND DEBRIDEMENT ABSCESS/DRESSING CHANGE;  Surgeon: Gwenyth Ober, MD;  Location: [place];  Service: General;  Laterality: Left;   JOINT REPLACEMENT  2010 / 2012    [place] N/A 07/02/2014   Procedure: LEFT HEART CATHETERIZATION WITH CORONARY ANGIOGRAM;  Surgeon: Lorretta Harp, MD;  Location: [facility] CATH LAB;  Service: Cardiovascular;  Laterality: N/A;   LOWER EXTREMITY ANGIOGRAM Right 04/20/2016   Procedure: Lower Extremity Angiogram;  Surgeon: Elam Dutch, MD;  Location: [place] CV LAB;  Service: Cardiovascular;  Laterality: Right;   MULTIPLE TOOTH EXTRACTIONS     PERIPHERAL VASCULAR CATHETERIZATION N/A 04/20/2016   Procedure: Abdominal Aortogram;  Surgeon: Elam Dutch, MD;  Location: [place] CV LAB;  Service: Cardiovascular;  Laterality: N/A;   PERIPHERAL VASCULAR CATHETERIZATION Right 04/20/2016   Procedure: Peripheral Vascular Intervention;  Surgeon: Elam Dutch, MD;  Location: [place] CV LAB;  Service: Cardiovascular;  Laterality: Right;  popiteal   RIGHT HEART CATH N/A 10/27/2018   Procedure: RIGHT HEART CATH;  Surgeon: Larey Dresser, MD;  Location: [place] CV LAB;  Service: Cardiovascular;  Laterality: N/A;   RIGHT HEART CATH N/A 03/20/2019   Procedure: RIGHT HEART CATH;  Surgeon: Larey Dresser, MD;  Location: [place] CV LAB;  Service: Cardiovascular;  Laterality: N/A;   THYROIDECTOMY     TOTAL HIP REVISION Right 08/11/2014   Procedure: RIGHT ACETABULAR REVISION;  Surgeon: Gearlean Alf, MD;  Location: [facility];  Service: Orthopedics;  Laterality: Right;   VASCULAR  SURGERY       Home Medications Prior to Admission medications   Medication Sig Start Date End Date Taking? Authorizing Provider  allopurinol (ZYLOPRIM) 100 MG tablet Take 1 tablet (100 mg total) by mouth daily. 03/05/21   Medina-Vargas, Monina C, NP  carvedilol (COREG) 12.5 MG tablet Take 1 tablet (12.5 mg total) by mouth 2 (two) times daily. 03/05/21   Medina-Vargas, Monina C, NP  cinacalcet (SENSIPAR) 30 MG tablet Take 1 tablet (30 mg total) by mouth daily. Patient not taking: No sig reported 03/05/21   Medina-Vargas, Monina C, NP  diclofenac Sodium (VOLTAREN) 1 % GEL Apply 1 application topically 4 (four) times daily as needed (pain). 08/15/20   [provider]  hydrOXYzine (ATARAX/VISTARIL) 25 MG tablet Take 1 tablet (25 mg total) by mouth 2 (two) times daily as needed for anxiety. 03/05/21   Medina-Vargas, Monina C, NP  insulin lispro protamine-lispro (HUMALOG 75/25 MIX) (75-25) 100 UNIT/ML SUSP injection Inject 30-40 Units into the skin See  admin instructions. Inject 40 units into the skin morning and 30 units in the evening    [provider]  levothyroxine (SYNTHROID) 200 MCG tablet Take 1 tablet (200 mcg total) by mouth daily at 6 (six) AM. 03/05/21   Medina-Vargas, Monina C, NP  lubiprostone (AMITIZA) 24 MCG capsule Take 24 mcg by mouth 2 (two) times daily. 06/06/21   [provider]  magnesium oxide (MAG-OX) 400 (241.3 Mg) MG tablet Take 1 tablet (400 mg total) by mouth 2 (two) times daily. 01/14/20   Lavina Hamman, MD  metoCLOPramide (REGLAN) 5 MG tablet Take 1 tablet (5 mg total) by mouth 3 (three) times daily. 03/05/21   Medina-Vargas, Monina C, NP  metolazone (ZAROXOLYN) 5 MG tablet Take 1 tablet (5 mg total) by mouth 3 (three) times a week. Takes 5mg  on Monday, Wednesday and Friday Patient not taking: No sig reported 03/06/21   Medina-Vargas, Monina C, NP  mirabegron ER (MYRBETRIQ) 50 MG TB24 tablet Take 50 mg by mouth daily.    [provider]   Multiple Vitamin (MULTIVITAMIN WITH MINERALS) TABS tablet Take 1 tablet by mouth daily.    [provider]  oxyCODONE-acetaminophen (PERCOCET) 10-325 MG tablet Take 1 tablet by mouth 4 (four) times daily as needed for pain. 07/10/21   Amin, Jeanella Flattery, MD  pantoprazole (PROTONIX) 40 MG tablet Take 1 tablet (40 mg total) by mouth 2 (two) times daily before a meal. 07/10/21   Amin, Jeanella Flattery, MD  polyethylene glycol (MIRALAX / GLYCOLAX) 17 g packet Take 17 g by mouth daily. 07/10/21   Amin, Jeanella Flattery, MD  polyvinyl alcohol (LIQUIFILM TEARS) 1.4 % ophthalmic solution Place 2 drops into both eyes daily as needed for dry eyes.    [provider]  Potassium Chloride ER 20 MEQ TBCR Take 1 tablet by mouth 3 (three) times a week. Tuesday, Thursdays and Saturdays 03/06/21   Medina-Vargas, Monina C, NP  senna-docusate (SENOKOT-S) 8.6-50 MG tablet Take 1 tablet by mouth at bedtime as needed for moderate constipation. 07/10/21   Amin, Jeanella Flattery, MD  torsemide (DEMADEX) 100 MG tablet Take 1 tablet (100 mg total) by mouth 2 (two) times daily. 03/05/21   Medina-Vargas, Monina C, NP  TRULICITY 1.5 VX/7.9TJ SOPN Inject 1.5 mg into the skin once a week. Monday Patient taking differently: Inject 1.5 mg into the skin every Monday. 03/05/21   Medina-Vargas, Monina C, NP      Allergies    Nebivolol, Ace inhibitors, and Morphine and related    Review of Systems   Review of Systems  HENT:  Negative for congestion.   Respiratory:  Negative for shortness of breath.   Gastrointestinal:  Positive for abdominal pain, nausea and vomiting.  Musculoskeletal:  Negative for gait problem.  Neurological:  Positive for seizures.   Physical Exam Updated Vital Signs BP (!) 156/84    Pulse 76    Temp 97.6 F (36.4 C) (Oral)    Resp 20    SpO2 97%  Physical Exam Vitals and nursing note reviewed.  Cardiovascular:     Rate and Rhythm: Normal rate.  Pulmonary:     Breath sounds: Normal breath sounds.   Abdominal:     Comments: Left-sided abdominal tenderness.  Skin:    General: Skin is warm.     Capillary Refill: Capillary refill takes less than 2 seconds.  Neurological:     Mental Status: She is alert and oriented to person, place, and time.    ED  Results / Procedures / Treatments   Labs (all labs ordered are listed, but only abnormal results are displayed) Labs Reviewed  CBC WITH DIFFERENTIAL/PLATELET - Abnormal; Notable for the following components:      Result Value   WBC 11.0 (*)    RBC 2.96 (*)    Hemoglobin 7.9 (*)    HCT 26.6 (*)    MCHC 29.7 (*)    RDW 23.5 (*)    Neutro Abs 9.3 (*)    Abs Immature Granulocytes 0.09 (*)    All other components within normal limits  COMPREHENSIVE METABOLIC PANEL - Abnormal; Notable for the following components:   Potassium 3.0 (*)    Chloride 97 (*)    Glucose, Bld 199 (*)    BUN 191 (*)    Creatinine, Ser 3.12 (*)    Total Protein 6.4 (*)    Albumin 3.0 (*)    GFR, Estimated 15 (*)    Anion gap 16 (*)    All other components within normal limits  LIPASE, BLOOD - Abnormal; Notable for the following components:   Lipase 65 (*)    All other components within normal limits  CBG MONITORING, ED - Abnormal; Notable for the following components:   Glucose-Capillary 174 (*)    All other components within normal limits  TROPONIN I (HIGH SENSITIVITY) - Abnormal; Notable for the following components:   Troponin I (High Sensitivity) 80 (*)    All other components within normal limits  URINALYSIS, ROUTINE W REFLEX MICROSCOPIC  POC OCCULT BLOOD, ED  TROPONIN I (HIGH SENSITIVITY)    EKG EKG Interpretation  Date/Time:  Tuesday November 21 2021 13:47:07 EST Ventricular Rate:  77 PR Interval:  192 QRS Duration: 144 QT Interval:  414 QTC Calculation: 468 R Axis:   -30 Text Interpretation: Normal sinus rhythm Left axis deviation Left bundle branch block Abnormal ECG When compared with ECG of 01-Jul-2021 03:33, PREVIOUS ECG IS  PRESENT Confirmed by Davonna Belling [phone]) on 11/21/2021 3:47:44 PM  Radiology CT Abdomen Pelvis Wo Contrast  Result Date: 11/21/2021 CLINICAL DATA:  Acute generalized abdominal pain. EXAM: CT ABDOMEN AND PELVIS WITHOUT CONTRAST TECHNIQUE: Multidetector CT imaging of the abdomen and pelvis was performed following the standard protocol without IV contrast. RADIATION DOSE REDUCTION: This exam was performed according to the departmental dose-optimization program which includes automated exposure control, adjustment of the mA and/or kV according to patient size and/or use of iterative reconstruction technique. COMPARISON:  Feb 15, 2021. FINDINGS: Lower chest: 2 left lower lobe nodules noted on prior exam have significantly enlarged. The largest measures 2.4 x 1.8 cm. This is highly concerning for malignancy and PET scan is recommended for further evaluation. Hepatobiliary: Status post cholecystectomy. No biliary dilatation is noted. No definite hepatic abnormality is noted. Pancreas: Unremarkable. No pancreatic ductal dilatation or surrounding inflammatory changes. Spleen: Normal in size without focal abnormality. Adrenals/Urinary Tract: Stable bilateral adrenal gland enlargement. Bilateral renal cysts are noted. Interval enlargement of 3.2 cm exophytic density arising from lower pole of left kidney concerning for neoplasm or malignancy. No hydronephrosis or renal obstruction is noted. Urinary bladder is unremarkable. Stomach/Bowel: The stomach appears normal. There is no evidence of bowel obstruction or inflammation. Stool seen throughout the colon. Vascular/Lymphatic: Aortic atherosclerosis. No enlarged abdominal or pelvic lymph nodes. Reproductive: Status post hysterectomy. No adnexal masses. Other: No abdominal wall hernia or abnormality. No abdominopelvic ascites. Musculoskeletal: Status post right total hip arthroplasty. No definite acute osseous abnormality is noted. IMPRESSION: Interval large mint of  3.2  cm exophytic soft tissue abnormality arising from lower pole of left kidney concerning for renal cell carcinoma. Further evaluation with MRI is recommended. Two left lower lobe nodules are noted which are significantly enlarged compared to prior exam, the largest measuring 2.4 cm. This is concerning for malignancy or metastatic disease. PET scan is recommended for further evaluation. Stable bilateral adrenal gland enlargement is noted. Aortic Atherosclerosis (ICD10-I70.0). Electronically Signed   By: Marijo Conception M.D.   On: 11/21/2021 15:20   DG Chest 2 View  Result Date: 11/21/2021 CLINICAL DATA:  A 71 year old female presents for evaluation of weakness, possible seizure. EXAM: CHEST - 2 VIEW COMPARISON:  Comparison made with July 04, 2021. FINDINGS: Exam limited by portable technique and patient body habitus. Heart size is enlarged and stable compared to previous imaging. Central pulmonary vascular engorgement without frank edema. Nodular density in the LEFT lower chest measuring up to 2 cm. This is in the LEFT retrocardiac region. No lobar consolidation. No sign of pneumothorax or of pleural effusion. On limited assessment there is no acute skeletal process. IMPRESSION: Electronically Signed   By: Zetta Bills M.D.   On: 11/21/2021 14:49   CT Head Wo Contrast  Result Date: 11/21/2021 CLINICAL DATA:  Head trauma.  Weakness.  Possible syncopal event. EXAM: CT HEAD WITHOUT CONTRAST TECHNIQUE: Contiguous axial images were obtained from the base of the skull through the vertex without intravenous contrast. RADIATION DOSE REDUCTION: This exam was performed according to the departmental dose-optimization program which includes automated exposure control, adjustment of the mA and/or kV according to patient size and/or use of iterative reconstruction technique. COMPARISON:  Head CT 03/16/2019 FINDINGS: Brain: There is no evidence of an acute infarct, intracranial hemorrhage, mass, midline shift, or  extra-axial fluid collection. 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 Hypodensities in the cerebral white matter bilaterally are unchanged and nonspecific but compatible with mild chronic small vessel ischemic disease. Mild cerebral atrophy is within normal limits for age. Vascular: Calcified atherosclerosis at the skull base. Skull: No acute fracture or suspicious osseous lesion. Sinuses/Orbits: Visualized paranasal sinuses and mastoid air cells are clear. Bilateral cataract extraction. Other: None. IMPRESSION: 1. No evidence of acute intracranial abnormality. 2. Mild chronic small vessel ischemic disease. Interval chronic right basal ganglia infarct. Electronically Signed   By: Logan Bores M.D.   On: 11/21/2021 15:27    Procedures Procedures    Medications Ordered in ED Medications - No data to display  ED Course/ Medical Decision Making/ A&P                           Medical Decision Making Amount and/or Complexity of Data Reviewed External Data Reviewed: radiology and notes.    Details: Previous CT scan and notes both independently reviewed. Labs: ordered. Decision-making details documented in ED Course. Radiology: independent interpretation performed. Decision-making details documented in ED Course.    Details: Enlarging lung nodules and new renal mass. ECG/medicine tests: independent interpretation performed. Decision-making details documented in ED Course.  Risk Decision regarding hospitalization.   Patient presents abdominal pain.  Syncopal episode potentially.  There was question of seizure activity.  Patient does not remember and really cannot get much history from her the aide that was with her is not here.  Does have left-sided abdominal tenderness.  White count is mildly elevated and hemoglobin is decreased.  Lower than has been recently.  Hemoccult done and was negative.  Troponin elevated  but stable from before.  Head CT does show interval stroke.  Has had previous  left-sided stroke.  However CT scan done of the abdomen and pelvis without contrast did show likely new renal cancer and likely lung cancer.  Previous lung masses are enlarging.  With syncope with potential seizures and new cancer potentially I feel she would benefit from mission to the hospital.  Head CT cannot also rule out intracranial malignancy.  Also with worsening anemia also potentially cause.  Potentially could be a vagal syndrome also.  Will discuss with unassigned medicine since patient states she now sees [employer] and no longer sees Dr. [name]         Final Clinical Impression(s) / ED Diagnoses Final diagnoses:  Syncope, unspecified syncope type  Anemia, unspecified type  Chronic kidney disease, unspecified CKD stage    Rx / DC Orders ED Discharge Orders     None         Davonna Belling, MD 11/21/21 1649

## 2021-11-21 NOTE — Hospital Course (Addendum)
Ms. Cisney is a lot more alert and oriented today. Appears to be doing better. Communicating well, smiling during conversation, and feels better.   Frustrated with care by nursing staff (NT).   She is ready to leave the hospital.   __________________________________________________ Patient presented to hospital with syncopal episode. She was uremic. HD was recommended but patient refused. Discussed options with her. Pallitaitve was consulted but patient again refused this as well. Shye ultimately decided to pursue HD given continuation of symptoms. IR consulted and placed tunneled cath. Underwent first session. Following day vascular placed AVG. She showed improvement  - Patient also noted to have renal and lung nodules on imaging in the ED. Mri was performed for renal nodules, thought to be hemorrhaginc.  Lung nodules were evaluated with dedicted CT, suspicious for cancer. Pulm was consulted for possible biopsy but recommended outpatient PET  Patient was noted to have worsening pain of irhgt arm. Evaluated and thought to be DVT vs cellulitis. Completed course of augmentin. She also had [name] which showed DVT and supervicial thrombus. Low risk of embolization not started on ac. Warm compressess.   Deconditioning. PT evaluated and wored with aptiet  Acute resp failur- thought multifactorial due to volume ovverload and COPD. She was diuresed with some imporvement. Also received albuter, bre, duonebs and prednisone with improvement able to be weaned off suppplemental O2.

## 2021-11-21 NOTE — H&P (Signed)
Date: 11/21/2021               Patient Name:  [name] [name] MRN: [id number]  DOB: 1951-06-15 Age / Sex: 71 y.o., female   PCP: [name], MD         Medical Service: Internal Medicine Teaching Service         Attending Physician: Dr. Jimmye Norman, Elaina Pattee, MD    First Contact: Dr. Delene Ruffini, MD  Pager: [phone]  Second Contact: Dr. Rick Duff, MD Pager: [phone]       After Hours (After 5p/  First Contact Pager: [phone]  weekends / holidays): Second Contact Pager: [phone]   Chief Complaint: Syncope   History of Present Illness:  History per patient as well as EMR and patient's home nursing aide as she does not recall syncopal episode.  Per patient is aide, she started having abdominal pain the day before admission.  She was noted to have an elevated blood glucose up to 300.  She was groggy this morning as well.  Patient drinks some coffee and her aide encouraged her to eat.  She relates her aide that she felt like she was going to vomit and have a bowel movement.  Her aide stated that the patient stood up and had some flatulence.  They proceeded to go to the toilet and the patient is a left of their briefly while she went to get toilet paper.  By the time she came back she states that the patient was unresponsive with her right arm tingling and twitching.  The patient's nursing aide notes that there was stool and urine in the commode which she is unsure if this occurred before or after the syncopal episode.  The patient is a started shaking the patient and she eventually woke up.  The patient was confused.  A short while after this the patient began throwing up red-colored vomitus.  The patient is a does note that the patient had recently had red fruit punch and this could have been the cause of her retention emesis.  After this, the patient had a second syncopal episode where she would not wake up and was twitching. This is when the patient's nursing aide called EMS.    By the time the patient was being transported to the EMS she was more awake but still not back to her baseline level of mentation.   The patient notes abdominal tenderness all over her abdomen, but denies fevers, chills.   Meds:  Patient does not recall her medications neither does her aide. Will ask her son in the AM to bring her medications.    Allergies: Allergies as of 11/21/2021 - Review Complete 11/21/2021  Allergen Reaction Noted   Nebivolol Swelling 04/17/2015   Ace inhibitors Swelling and Other (See Comments) 11/21/2010   Morphine and related Itching 11/21/2010   Past Medical History:  Diagnosis Date   Anemia    Arthritis    Asthma    Chronic diastolic (congestive) heart failure (HCC)    Chronic kidney disease    Chronic pain syndrome    Class 2 obesity due to excess calories with body mass index (BMI) of 36.0 to 36.9 in adult    COPD (chronic obstructive pulmonary disease) (HCC)    Diabetes mellitus without complication (HCC)    Full dentures    GERD (gastroesophageal reflux disease)    Gout    History of hiatal hernia    History of transfusion  Hx MRSA infection    abscess left groin   Hyperlipidemia    Hypertension    Hypothyroid    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HCC)    Peripheral vascular disease (HCC)    Sleep apnea    UNABLE TO TOLERATE C PAP   Stress incontinence    TIA (transient ischemic attack)     X2 NO RESIDUAL PROBLEMS   Wears glasses     Family History:  Patient drowsy and unable to recall.   Social History:  Patient drowsy and unable to recall.  Review of Systems: A complete ROS was negative except as per HPI.   Physical Exam: Blood pressure (!) 149/68, pulse 84, temperature 97.6 F (36.4 C), temperature source Oral, resp. rate 16, SpO2 96 %.   Constitutional: Well-developed, well-nourished, drowsy HENT:  Head: Normocephalic and atraumatic.  Eyes: Patient drowsy and intermittently following commands Neck: Normal range of  motion.  Cardiovascular: Normal rate, regular rhythm, intact distal pulses. No gallop and no friction rub.  No murmur heard.  Bilateral lower extremity edema right greater than left.  2+ to the knee right side, left 1+ Pulmonary: Non labored breathing on room air, no wheezing or rales  Abdominal: Soft.  Hyperactive bowel sounds in right lower quadrant. Non distended.  Tender to palpation diffusely, nonperitonitic Musculoskeletal: Normal range of motion.        General: No tenderness or edema.  Neurological: Alert and oriented to person, place, and time when able to awake patient.  Exam limited by patient's drowsiness Skin: Skin is warm and dry.    EKG: personally reviewed my interpretation is NSR, LAD  CXR:   FINDINGS: Exam limited by portable technique and patient body habitus. Heart size is enlarged and stable compared to previous imaging. Central pulmonary vascular engorgement without frank edema.   Nodular density in the LEFT lower chest measuring up to 2 cm. This is in the LEFT retrocardiac region.  CT head w/o contrast  FINDINGS: Brain: There is no evidence of an acute infarct, intracranial hemorrhage, mass, midline shift, or extra-axial fluid collection. 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 Hypodensities in the cerebral white matter bilaterally are unchanged and nonspecific but compatible with mild chronic small vessel ischemic disease. Mild cerebral atrophy is within normal limits for age.   Vascular: Calcified atherosclerosis at the skull base.   Skull: No acute fracture or suspicious osseous lesion.   Sinuses/Orbits: Visualized paranasal sinuses and mastoid air cells are clear. Bilateral cataract extraction.   Other: None.   IMPRESSION: 1. No evidence of acute intracranial abnormality. 2. Mild chronic small vessel ischemic disease. Interval chronic right basal ganglia infarct.  CT A/P   FINDINGS: Lower chest: 2 left lower lobe nodules noted on prior  exam have significantly enlarged. The largest measures 2.4 x 1.8 cm. This is highly concerning for malignancy and PET scan is recommended for further evaluation.   Hepatobiliary: Status post cholecystectomy. No biliary dilatation is noted. No definite hepatic abnormality is noted.   Pancreas: Unremarkable. No pancreatic ductal dilatation or surrounding inflammatory changes.   Spleen: Normal in size without focal abnormality.   Adrenals/Urinary Tract: Stable bilateral adrenal gland enlargement. Bilateral renal cysts are noted. Interval enlargement of 3.2 cm exophytic density arising from lower pole of left kidney concerning for neoplasm or malignancy. No hydronephrosis or renal obstruction is noted. Urinary bladder is unremarkable.   Stomach/Bowel: The stomach appears normal. There is no evidence of bowel obstruction or inflammation. Stool seen throughout the colon.  Vascular/Lymphatic: Aortic atherosclerosis. No enlarged abdominal or pelvic lymph nodes.   Reproductive: Status post hysterectomy. No adnexal masses.   Other: No abdominal wall hernia or abnormality. No abdominopelvic ascites.   Musculoskeletal: Status post right total hip arthroplasty. No definite acute osseous abnormality is noted.   IMPRESSION: Interval large mint of 3.2 cm exophytic soft tissue abnormality arising from lower pole of left kidney concerning for renal cell carcinoma. Further evaluation with MRI is recommended.   Two left lower lobe nodules are noted which are significantly enlarged compared to prior exam, the largest measuring 2.4 cm. This is concerning for malignancy or metastatic disease. PET scan is recommended for further evaluation.   Stable bilateral adrenal gland enlargement is noted.   Assessment & Plan by Problem: Principal Problem:   Syncope  #Syncopal episode   Unclear exact etiology of patient's syncopal episode.  Patient was on the toilet and could have been straining to  defecate resulted in a vasovagal episode.  I suspect that this is most likely.  However, patient could have had a seizure and become incontinent of stool and urine and been postictal.  Per patient's nursing aide she had an additional episode as well.  Patient does not have a personal history of seizure that is documented in the EMR.  Unclear if she has a family history of seizures. -We will follow-up EEG -Telemetry, patient with frequent PVCs and PACs on monitor at bedside.  This too could have contributed to her episode.  She has no history of arrhythmia -Patient is uremic to 190s and this could have also precipitated her seizures. She follows with nephrology and they are recommending HD, however patient is not agreeable to that at this time.   #Abdominal Pain #Constipation Patient reports that she began having diffuse abdominal pain the day prior to admission.  Her nursing a corroborates this.  She states that the pain is a cramping pain and notes that it is not worse or better with bowel movements, eating.  Her exam is notable for a nonperitonitic soft abdomen with diffuse tenderness to palpation.  Patient is also noted to have hyperactive bowel sounds in the right lower quadrant.  CT abdomen pelvis obtained this hospitalization notable for large colonic stool burden.  This is most likely the etiology of patient's symptoms.  In addition hyperactive bowel sounds in the right lower quadrant suggest that she is peristalsing against obstruction.  The rest of her CT with respect to her GI system is unremarkable. -Daily tapwater enemas   #CKD stage 5 Produces urine, unclear volume or frequency. Patient with persistently elevated serum creatinine as well as BUN in the 100s.  She states that she follows with nephrology outpatient and they are currently recommending dialysis however she would not like to proceed with that at this time.  If her recent episode could potentially be explained by her uremia  discussed with patient in the a.m. if she would consider HD. -Avoid nephrotoxic agents  #HFmrEF Patient's echo 02/2021.  EF noted to be 45 to 50% at that time.  There was no wall motion abnormalities.  She was noted to have left ventricular hypertrophy, no valvulopathy, left atrium was noted to be severely dilated.  Patient is on torsemide milligrams twice daily per EMR as well as metolazone 3 times a week.  We will repeat echo given patient's significant lower extremity edema right greater than left.  We will also obtain venous duplex studies of left lower extremity, though low suspicion for DVT.  #  T2DM Patient's last A1c was 02/2021 and noted to be 6.8.  Per EMR she takes 75/25 insulin 40 units in the a.m. and 30 units in the evening as well as Trulicity 1.5 mg weekly.  Patient is also on Reglan with 3 times daily dosing. -We will start patient on sliding scale insulin and continue her home Reglan.  Will uptitrate as necessary.  #LLL nodules Patient with left lower lobe nodules noted on previous imaging.  However, on CT obtained this hospitalization there noted to be larger, with the largest measuring 2.4 cm.  Per radiology this is concerning for malignancy or metastatic disease and a PET scan is recommended for further evaluation.  Unclear of patient's oncologic history, however per chart no prior cancer.  #Renal mass  Patient noted to have interval enlargement of L kidney  exophytic density (now 3.2 cm) compared to 02/2021 imaging. This is also c/f malignancy and MRI for further evaluation is recommended.   #Anemia  Patient noted to have a normocytic anemia.  Her hemoglobin appears to be at baseline around 9.  This admission it is 7.9.  Patient does have her recent history of hospitalization for upper GI bleed and she was noted to have an AVM on pill endoscopy.  Per patient's age she did have some bloody vomitus, though she does note she did recently drink red-colored liquid.  Patient was also  hemodynamically stable on admission.  Patient was noted to be significantly iron deficient on labs 09/2021 with a ferritin of 11 and a transferrin saturation of 7%.  She did receive IV iron in the past for this most recently, [facility] 10/04/2021.  Patient also has CKD and this could be contributing to her anemia as well. She has received epo injections for this.  -Follow-up iron studies -IV PPI -Will likely need IV iron infusion  #COPD  No wheezing noted on exam. No inhalers noted in EMR.  -PRN duonebs.   Dispo: Admit patient to Observation with expected length of stay less than 2 midnights.  Signed: Rick Duff, MD 11/21/2021, 7:21 PM  Pager: [phone] After 5pm on weekdays and 1pm on weekends: On Call pager: [phone]

## 2021-11-21 NOTE — ED Provider Triage Note (Signed)
Emergency Medicine Provider Triage Evaluation Note  [name] [name] , a 71 y.o. female  was evaluated in triage.  Pt complains of abdominal pain.  She also is having weakness and had a possible syncopal event.  States she having abdominal pain for 3 to 4 days, she passed out on the toilet today.  There is no witnessed seizure-like activity, no history of seizures per the patient.  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  Extensive medical history..  Review of Systems  Positive: ABOVE Negative: ABOVE  Physical Exam  BP (!) 136/57 (BP Location: Right Arm)    Pulse 74    Temp 97.6 F (36.4 C) (Oral)    Resp 18    SpO2 92%  Gen:   Awake, no distress   Resp:  Normal effort  MSK:   Moves extremities without difficulty  Other:  Patient is oriented.  Follows commands, left upper extremity is weaker than right.  She has diffuse abdominal tenderness on exam.  Medical Decision Making  Medically screening exam initiated at 2:06 PM.  Appropriate orders placed.  VELA RENDER was informed that the remainder of the evaluation will be completed by another provider, this initial triage assessment does not replace that evaluation, and the importance of remaining in the ED until their evaluation is complete.  Patient is a challenging historian.  Wide work-up given vagueness of actual chief complaint.   Sherrill Raring, PA-C 11/21/21 1407

## 2021-11-22 ENCOUNTER — Observation Stay (HOSPITAL_COMMUNITY): Payer: Medicare Other

## 2021-11-22 ENCOUNTER — Observation Stay (HOSPITAL_BASED_OUTPATIENT_CLINIC_OR_DEPARTMENT_OTHER): Payer: Medicare Other

## 2021-11-22 ENCOUNTER — Encounter (HOSPITAL_COMMUNITY): Payer: Self-pay | Admitting: Internal Medicine

## 2021-11-22 DIAGNOSIS — R55 Syncope and collapse: Secondary | ICD-10-CM | POA: Diagnosis not present

## 2021-11-22 DIAGNOSIS — M7989 Other specified soft tissue disorders: Secondary | ICD-10-CM | POA: Diagnosis not present

## 2021-11-22 DIAGNOSIS — D72829 Elevated white blood cell count, unspecified: Secondary | ICD-10-CM

## 2021-11-22 DIAGNOSIS — I5021 Acute systolic (congestive) heart failure: Secondary | ICD-10-CM

## 2021-11-22 DIAGNOSIS — N185 Chronic kidney disease, stage 5: Secondary | ICD-10-CM

## 2021-11-22 DIAGNOSIS — N2889 Other specified disorders of kidney and ureter: Secondary | ICD-10-CM

## 2021-11-22 DIAGNOSIS — R918 Other nonspecific abnormal finding of lung field: Secondary | ICD-10-CM

## 2021-11-22 DIAGNOSIS — M79604 Pain in right leg: Secondary | ICD-10-CM | POA: Diagnosis not present

## 2021-11-22 DIAGNOSIS — I5020 Unspecified systolic (congestive) heart failure: Secondary | ICD-10-CM

## 2021-11-22 DIAGNOSIS — K5900 Constipation, unspecified: Secondary | ICD-10-CM

## 2021-11-22 DIAGNOSIS — E1122 Type 2 diabetes mellitus with diabetic chronic kidney disease: Secondary | ICD-10-CM | POA: Diagnosis not present

## 2021-11-22 DIAGNOSIS — R109 Unspecified abdominal pain: Secondary | ICD-10-CM

## 2021-11-22 LAB — GLUCOSE, CAPILLARY: Glucose-Capillary: 248 mg/dL — ABNORMAL HIGH (ref 70–99)

## 2021-11-22 LAB — CBC
HCT: 27.6 % — ABNORMAL LOW (ref 36.0–46.0)
Hemoglobin: 8.4 g/dL — ABNORMAL LOW (ref 12.0–15.0)
MCH: 26.9 pg (ref 26.0–34.0)
MCHC: 30.4 g/dL (ref 30.0–36.0)
MCV: 88.5 fL (ref 80.0–100.0)
Platelets: 282 10*3/uL (ref 150–400)
RBC: 3.12 MIL/uL — ABNORMAL LOW (ref 3.87–5.11)
RDW: 23.4 % — ABNORMAL HIGH (ref 11.5–15.5)
WBC: 18.7 10*3/uL — ABNORMAL HIGH (ref 4.0–10.5)
nRBC: 0 % (ref 0.0–0.2)

## 2021-11-22 LAB — BASIC METABOLIC PANEL
Anion gap: 16 — ABNORMAL HIGH (ref 5–15)
BUN: 186 mg/dL — ABNORMAL HIGH (ref 8–23)
CO2: 27 mmol/L (ref 22–32)
Calcium: 9.8 mg/dL (ref 8.9–10.3)
Chloride: 98 mmol/L (ref 98–111)
Creatinine, Ser: 3.03 mg/dL — ABNORMAL HIGH (ref 0.44–1.00)
GFR, Estimated: 16 mL/min — ABNORMAL LOW (ref >60)
Glucose, Bld: 100 mg/dL — ABNORMAL HIGH (ref 70–99)
Potassium: 3.3 mmol/L — ABNORMAL LOW (ref 3.5–5.1)
Sodium: 141 mmol/L (ref 135–145)

## 2021-11-22 LAB — ECHOCARDIOGRAM COMPLETE
AR max vel: 1.07 cm2
AV Area VTI: 1.32 cm2
AV Area mean vel: 1.08 cm2
AV Mean grad: 8 mmHg
AV Peak grad: 16.8 mmHg
Ao pk vel: 2.05 m/s
Area-P 1/2: 1.7 cm2
Calc EF: 44.3 %
MV VTI: 0.59 cm2
S' Lateral: 4 cm
Single Plane A2C EF: 43.4 %
Single Plane A4C EF: 44.3 %

## 2021-11-22 LAB — HEMOGLOBIN A1C
Hgb A1c MFr Bld: 6.3 % — ABNORMAL HIGH (ref 4.8–5.6)
Mean Plasma Glucose: 134.11 mg/dL

## 2021-11-22 LAB — IRON AND TIBC
Iron: 31 ug/dL (ref 28–170)
Saturation Ratios: 8 % — ABNORMAL LOW (ref 10.4–31.8)
TIBC: 385 ug/dL (ref 250–450)
UIBC: 354 ug/dL

## 2021-11-22 LAB — PHOSPHORUS: Phosphorus: 6.1 mg/dL — ABNORMAL HIGH (ref 2.5–4.6)

## 2021-11-22 LAB — CBG MONITORING, ED
Glucose-Capillary: 100 mg/dL — ABNORMAL HIGH (ref 70–99)
Glucose-Capillary: 120 mg/dL — ABNORMAL HIGH (ref 70–99)
Glucose-Capillary: 198 mg/dL — ABNORMAL HIGH (ref 70–99)

## 2021-11-22 LAB — TSH: TSH: 3.86 u[IU]/mL (ref 0.350–4.500)

## 2021-11-22 LAB — MAGNESIUM: Magnesium: 1.9 mg/dL (ref 1.7–2.4)

## 2021-11-22 LAB — FERRITIN: Ferritin: 23 ng/mL (ref 11–307)

## 2021-11-22 MED ORDER — POLYETHYLENE GLYCOL 3350 17 G PO PACK
17.0000 g | PACK | Freq: Every day | ORAL | Status: DC | PRN
  Administered 2021-11-24: 17 g via ORAL
  Filled 2021-11-22: qty 1

## 2021-11-22 MED ORDER — GABAPENTIN 100 MG PO CAPS
100.0000 mg | ORAL_CAPSULE | Freq: Once | ORAL | Status: AC
  Administered 2021-11-22: 100 mg via ORAL
  Filled 2021-11-22: qty 1

## 2021-11-22 MED ORDER — LACTATED RINGERS IV SOLN: INTRAVENOUS | Status: AC

## 2021-11-22 NOTE — Progress Notes (Signed)
° °  Echocardiogram 2D Echocardiogram has been performed.  Beryle Beams 11/22/2021, 2:02 PM

## 2021-11-22 NOTE — Progress Notes (Signed)
HD#0 SUBJECTIVE:  Patient Summary: [name] [name] is a 71 y.o. with a pertinent PMH of DM2 and CKD stage 5, who presented with LOC and admitted for syncope.   Overnight Events: no acute events overnight    Interm History: states she feels "rough" this AM. Complains of pain in her back, has not had BM in while.   OBJECTIVE:  Vital Signs: Vitals:   11/22/21 0600 11/22/21 0615 11/22/21 0630 11/22/21 0825  BP: (!) 126/59 (!) 150/76 (!) 150/67 134/70  Pulse: 76 94 82 87  Resp: 14 16 (!) 21 14  Temp:    97.6 F (36.4 C)  TempSrc:    Oral  SpO2: 90% 97% 99% 97%   Constitutional: Well-developed, well-nourished, awake and alert, oriented X3 Head: Normocephalic and atraumatic.  Eyes: EOM intact Neck: Normal range of motion.  Cardiovascular: Normal rate, regular rhythm, intact distal pulses. No gallop and no friction rub.  No murmur heard.  Bilateral lower extremity edema right greater than left.  2+ to the knee right side, left 1+ Pulmonary: Non labored breathing on room air, crackles bilateral lower extremities Abdominal: Soft.  Hyperactive bowel sounds in right lower quadrant. Non distended. Improved tenderness, nonperitonitic Musculoskeletal: Normal range of motion. Tender lower extremities, right more edematous than left Neurological: Alert and oriented to person, place, and time when able to awake patient.  Exam limited by patient's drowsiness Skin: Left lower extremities with erythema and warmth, wound left shin.    Patient Lines/Drains/Airways Status     Active Line/Drains/Airways     Name Placement date Placement time Site Days   Peripheral IV 11/21/21 20 G 1" Right Antecubital 11/21/21  1412  Antecubital  1   Fistula / Graft Left Upper arm  04/05/20  1300  Upper arm  596   Incision (Closed) 09/18/19 Back Other (Comment) 09/18/19  1047  -- 796   Incision (Closed) 04/05/20 Arm Left 04/05/20  1212  -- 596            Pertinent Labs: CBC Latest Ref Rng & Units  11/22/2021 11/21/2021 10/04/2021  WBC 4.0 - 10.5 K/uL 18.7(H) 11.0(H) -  Hemoglobin 12.0 - 15.0 g/dL 8.4(L) 7.9(L) 9.0(L)  Hematocrit 36.0 - 46.0 % 27.6(L) 26.6(L) -  Platelets 150 - 400 K/uL 282 265 -    CMP Latest Ref Rng & Units 11/22/2021 11/21/2021 09/07/2021  Glucose 70 - 99 mg/dL 100(H) 199(H) 119(H)  BUN 8 - 23 mg/dL 186(H) 191(H) 176(H)  Creatinine 0.44 - 1.00 mg/dL 3.03(H) 3.12(H) 3.55(H)  Sodium 135 - 145 mmol/L 141 138 140  Potassium 3.5 - 5.1 mmol/L 3.3(L) 3.0(L) 3.3(L)  Chloride 98 - 111 mmol/L 98 97(L) 95(L)  CO2 22 - 32 mmol/L 27 25 30   Calcium 8.9 - 10.3 mg/dL 9.8 9.3 8.8(L)  Total Protein 6.5 - 8.1 g/dL - 6.4(L) -  Total Bilirubin 0.3 - 1.2 mg/dL - 0.5 -  Alkaline Phos 38 - 126 U/L - 55 -  AST 15 - 41 U/L - 20 -  ALT 0 - 44 U/L - 15 -    Recent Labs    11/21/21 1612 11/22/21 0409 11/22/21 0853  GLUCAP 174* 100* 120*     Pertinent Imaging: CT Abdomen Pelvis Wo Contrast  Result Date: 11/21/2021 CLINICAL DATA:  Acute generalized abdominal pain. EXAM: CT ABDOMEN AND PELVIS WITHOUT CONTRAST TECHNIQUE: Multidetector CT imaging of the abdomen and pelvis was performed following the standard protocol without IV contrast. RADIATION DOSE REDUCTION: This exam was  performed according to the departmental dose-optimization program which includes automated exposure control, adjustment of the mA and/or kV according to patient size and/or use of iterative reconstruction technique. COMPARISON:  Feb 15, 2021. FINDINGS: Lower chest: 2 left lower lobe nodules noted on prior exam have significantly enlarged. The largest measures 2.4 x 1.8 cm. This is highly concerning for malignancy and PET scan is recommended for further evaluation. Hepatobiliary: Status post cholecystectomy. No biliary dilatation is noted. No definite hepatic abnormality is noted. Pancreas: Unremarkable. No pancreatic ductal dilatation or surrounding inflammatory changes. Spleen: Normal in size without focal abnormality.  Adrenals/Urinary Tract: Stable bilateral adrenal gland enlargement. Bilateral renal cysts are noted. Interval enlargement of 3.2 cm exophytic density arising from lower pole of left kidney concerning for neoplasm or malignancy. No hydronephrosis or renal obstruction is noted. Urinary bladder is unremarkable. Stomach/Bowel: The stomach appears normal. There is no evidence of bowel obstruction or inflammation. Stool seen throughout the colon. Vascular/Lymphatic: Aortic atherosclerosis. No enlarged abdominal or pelvic lymph nodes. Reproductive: Status post hysterectomy. No adnexal masses. Other: No abdominal wall hernia or abnormality. No abdominopelvic ascites. Musculoskeletal: Status post right total hip arthroplasty. No definite acute osseous abnormality is noted. IMPRESSION: Interval large mint of 3.2 cm exophytic soft tissue abnormality arising from lower pole of left kidney concerning for renal cell carcinoma. Further evaluation with MRI is recommended. Two left lower lobe nodules are noted which are significantly enlarged compared to prior exam, the largest measuring 2.4 cm. This is concerning for malignancy or metastatic disease. PET scan is recommended for further evaluation. Stable bilateral adrenal gland enlargement is noted. Aortic Atherosclerosis (ICD10-I70.0). Electronically Signed   By: Marijo Conception M.D.   On: 11/21/2021 15:20   DG Chest 2 View  Result Date: 11/21/2021 CLINICAL DATA:  A 71 year old female presents for evaluation of weakness, possible seizure. EXAM: CHEST - 2 VIEW COMPARISON:  Comparison made with July 04, 2021. FINDINGS: Exam limited by portable technique and patient body habitus. Heart size is enlarged and stable compared to previous imaging. Central pulmonary vascular engorgement without frank edema. Nodular density in the LEFT lower chest measuring up to 2 cm. This is in the LEFT retrocardiac region. No lobar consolidation. No sign of pneumothorax or of pleural effusion.  On limited assessment there is no acute skeletal process. IMPRESSION: Electronically Signed   By: Zetta Bills M.D.   On: 11/21/2021 14:49   CT Head Wo Contrast  Result Date: 11/21/2021 CLINICAL DATA:  Head trauma.  Weakness.  Possible syncopal event. EXAM: CT HEAD WITHOUT CONTRAST TECHNIQUE: Contiguous axial images were obtained from the base of the skull through the vertex without intravenous contrast. RADIATION DOSE REDUCTION: This exam was performed according to the departmental dose-optimization program which includes automated exposure control, adjustment of the mA and/or kV according to patient size and/or use of iterative reconstruction technique. COMPARISON:  Head CT 03/16/2019 FINDINGS: Brain: There is no evidence of an acute infarct, intracranial hemorrhage, mass, midline shift, or extra-axial fluid collection. 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 Hypodensities in the cerebral white matter bilaterally are unchanged and nonspecific but compatible with mild chronic small vessel ischemic disease. Mild cerebral atrophy is within normal limits for age. Vascular: Calcified atherosclerosis at the skull base. Skull: No acute fracture or suspicious osseous lesion. Sinuses/Orbits: Visualized paranasal sinuses and mastoid air cells are clear. Bilateral cataract extraction. Other: None. IMPRESSION: 1. No evidence of acute intracranial abnormality. 2. Mild chronic small vessel ischemic disease. Interval chronic right basal ganglia  infarct. Electronically Signed   By: Logan Bores M.D.   On: 11/21/2021 15:27    ASSESSMENT/PLAN:  Assessment: Principal Problem:   Syncope  #CKD stage 5 Produces urine, unclear volume or frequency. Patient with persistently elevated serum creatinine as well as BUN in the 100s.  She states that she follows with nephrology outpatient and they are currently recommending dialysis however she has declined HD until now.  She states that her reason for not wanting to pursue  HD is that she is afraid of what it means. Discussed at length with her that if she were to not pursue HD at this time that it the alternative would be hospice. She became very tearful stating that she did not want to die. We recommended that she meet with a nephrologist to discuss options in the future. She is agreeable to meeting with nephrology during this hospitalization.  - she is uremic to 180's and has been noted to be drowsy at times, although is currently mentating adequately. Although she would greatly benefit from dialysis, she does not require it emergently. Uremia has been chronic.  -  Additionally, her lower extremities are edematous with crackles in her lower lungs, however she has evidence of volume depletion, flat neck veins and poor skin turgor in her upper chest and extrmweities. She would likely benefit from IVFs. Will plan to gve LR 75cc/hr and recheck BMP.  - patient is not feeling ready to discuss with nephro at this time. Will broach topic with her again tomorrow. May consult nephro tomorrow.  #Syncopal episode  Etiology vasovagal vs seizure. Patient was on toilet when she synopsized raising concern for vasovagal. She does have significant uremia which also predisposes her to seizures.  -We will follow-up EEG - fu orthostatics -Telemetry, patient with frequent PVCs and PACs on monitor at bedside.  Leukocytosis - patient has been afebrile. She does not appear to have evidence of lung, GI, urine. She does have some erythema of her lower extremity with warmth, which may the the cause of her leukocytosis, however she states this wound is chronic and she has been afebrile. Leukocytosis may be reactive.  - Will plan to recheck CBC and if it remains elevated, can begin antibiotics. Will continue to monitor at this time.    #Abdominal Pain #Constipation Generalized abdominal pain.Imaging revealed large stool burden. 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  EF noted to be 45 to 50% at that time.  There was no wall motion abnormalities.  She was noted to have left ventricular hypertrophy, no valvulopathy, left atrium was noted to be severely dilated.  Patient is on torsemide milligrams twice daily per EMR as well as metolazone 3 times a week.   - fu ECHO - fu LE [name]   #T2DM Patient's last A1c was 02/2021 and noted to be 6.8.  Per EMR she takes 75/25 insulin 40 units in the a.m. and 30 units in the evening as well as Trulicity 1.5 mg weekly.  Patient is also on Reglan with 3 times daily dosing. -We will start patient on sliding scale insulin and continue her home Reglan.  Will uptitrate as necessary.   #LLL nodules Patient with left lower lobe nodules noted on previous imaging.  However, on CT obtained this hospitalization there noted to be larger, with the largest measuring 2.4 cm.  Per radiology this is concerning for malignancy or metastatic disease  and a PET scan is recommended for further evaluation.  Unclear of patient's oncologic history, however per chart no prior cancer.   #Renal mass  Patient noted to have interval enlargement of L kidney  exophytic density (now 3.2 cm) compared to 02/2021 imaging. This is also c/f malignancy and MRI for further evaluation is recommended.    #Anemia  Patient noted to have a normocytic anemia.  Her hemoglobin appears to be at baseline around 9.  This admission it is 7.9.  Patient does have her recent history of hospitalization for upper GI bleed and she was noted to have an AVM on pill endoscopy.  Per patient's age she did have some bloody vomitus, though she does note she did recently drink red-colored liquid.  Patient was also hemodynamically stable on admission.  Patient was noted to be significantly iron deficient on labs 09/2021 with a ferritin of 11 and a transferrin saturation of 7%.  She did receive IV iron in the past for this most  recently, [facility] 10/04/2021.  Patient also has CKD and this could be contributing to her anemia as well. She has received epo injections for this.  -Follow-up iron studies -IV PPI -Will likely need IV iron infusion      Dispo: Admit patient to Observation with expected length of stay less than 2 midnights.  Signature: Delene Ruffini, MD  Internal Medicine Resident, PGY-1 [facility]  Pager: [phone] 10:14 AM, 11/22/2021   Please contact the on call pager after 5 pm and on weekends at [phone].

## 2021-11-22 NOTE — Procedures (Signed)
History: 71 yo F being evaluated for syncope.   Sedation: None  Technique: This EEG was acquired with electrodes placed according to the International 10-20 electrode system (including Fp1, Fp2, F3, F4, C3, C4, P3, P4, O1, O2, T3, T4, T5, T6, A1, A2, Fz, Cz, Pz). The following electrodes were missing or displaced: none.   Background: There is a posterior dominant rhythm of 8-9 Hz which is poorly sustained. There are occasional bifrontally predominant discharges with triphasic morphology. There eis diffuse high voltage irregular delta and theta range activity throughout the study.  Photic stimulation: Physiologic driving is not performed  EEG Abnormalities: 1) Triphasic waves 2) generalized slow activity.    Clinical Interpretation: This EEG is consistent with a generalized non-specific cerebral dysfunction(encephalopathy). There was no seizure or seizure predisposition recorded on this study. Please note that lack of epileptiform activity on EEG does not preclude the possibility of epilepsy.   Roland Rack, MD [facility] [phone]  If 7pm- 7am, please page neurology on call as listed in [place].

## 2021-11-22 NOTE — Progress Notes (Deleted)
Lower extremity venous RT study completed.   Please see CV Proc for preliminary results.   Giulio Bertino, RDMS, RVT  

## 2021-11-22 NOTE — Progress Notes (Signed)
EEG complete - results pending 

## 2021-11-22 NOTE — Progress Notes (Signed)
Lower extremity venous RT study completed.   Please see CV Proc for preliminary results.   Taneka Espiritu, RDMS, RVT  

## 2021-11-22 NOTE — ED Notes (Signed)
Breakfast Orders Placed °

## 2021-11-22 NOTE — Consult Note (Signed)
[facility] Nurse Consult Note: Reason for Consult: Consult requested for left leg. Wound type: Chronic full thickness wound to left anterior thigh; red and moist, 3X1X.2cm.  Pt is followed by the outpatient wound care center, according to progress notes, and they are using hydroferra blue.  This topical treatment is not available in the [place], and I will substitute a similar product, Aquacel to provide antimicrobial benefits and absorb drainage.  Dressing procedure/placement/frequency: Topical treatment orders provided for bedside nurses to perform as follows: Apply Aquacel to left leg wound Q day Kellie Simmering # [phone]) then cover with foam dressing.  (Change foam dressing Q 3 days or PRN soiling.)  Moisten with NS to remove dressing each time.  Please re-consult if further assistance is needed.  Thank-you,  Julien Girt MSN, [place], [place], [place], [place]

## 2021-11-23 DIAGNOSIS — N185 Chronic kidney disease, stage 5: Secondary | ICD-10-CM | POA: Diagnosis not present

## 2021-11-23 DIAGNOSIS — Z515 Encounter for palliative care: Secondary | ICD-10-CM | POA: Diagnosis not present

## 2021-11-23 DIAGNOSIS — E119 Type 2 diabetes mellitus without complications: Secondary | ICD-10-CM | POA: Diagnosis not present

## 2021-11-23 DIAGNOSIS — E890 Postprocedural hypothyroidism: Secondary | ICD-10-CM | POA: Diagnosis present

## 2021-11-23 DIAGNOSIS — D649 Anemia, unspecified: Secondary | ICD-10-CM

## 2021-11-23 DIAGNOSIS — L89312 Pressure ulcer of right buttock, stage 2: Secondary | ICD-10-CM | POA: Diagnosis not present

## 2021-11-23 DIAGNOSIS — I132 Hypertensive heart and chronic kidney disease with heart failure and with stage 5 chronic kidney disease, or end stage renal disease: Secondary | ICD-10-CM | POA: Diagnosis present

## 2021-11-23 DIAGNOSIS — N179 Acute kidney failure, unspecified: Secondary | ICD-10-CM | POA: Diagnosis present

## 2021-11-23 DIAGNOSIS — I5023 Acute on chronic systolic (congestive) heart failure: Secondary | ICD-10-CM | POA: Diagnosis not present

## 2021-11-23 DIAGNOSIS — M79601 Pain in right arm: Secondary | ICD-10-CM | POA: Diagnosis not present

## 2021-11-23 DIAGNOSIS — Z7189 Other specified counseling: Secondary | ICD-10-CM | POA: Diagnosis not present

## 2021-11-23 DIAGNOSIS — R609 Edema, unspecified: Secondary | ICD-10-CM | POA: Diagnosis not present

## 2021-11-23 DIAGNOSIS — I69334 Monoplegia of upper limb following cerebral infarction affecting left non-dominant side: Secondary | ICD-10-CM | POA: Diagnosis not present

## 2021-11-23 DIAGNOSIS — T82590A Other mechanical complication of surgically created arteriovenous fistula, initial encounter: Secondary | ICD-10-CM | POA: Diagnosis not present

## 2021-11-23 DIAGNOSIS — N289 Disorder of kidney and ureter, unspecified: Secondary | ICD-10-CM | POA: Diagnosis not present

## 2021-11-23 DIAGNOSIS — R918 Other nonspecific abnormal finding of lung field: Secondary | ICD-10-CM | POA: Diagnosis not present

## 2021-11-23 DIAGNOSIS — E1122 Type 2 diabetes mellitus with diabetic chronic kidney disease: Secondary | ICD-10-CM | POA: Diagnosis present

## 2021-11-23 DIAGNOSIS — J441 Chronic obstructive pulmonary disease with (acute) exacerbation: Secondary | ICD-10-CM | POA: Diagnosis not present

## 2021-11-23 DIAGNOSIS — L03113 Cellulitis of right upper limb: Secondary | ICD-10-CM | POA: Diagnosis not present

## 2021-11-23 DIAGNOSIS — N2581 Secondary hyperparathyroidism of renal origin: Secondary | ICD-10-CM | POA: Diagnosis present

## 2021-11-23 DIAGNOSIS — R064 Hyperventilation: Secondary | ICD-10-CM | POA: Diagnosis not present

## 2021-11-23 DIAGNOSIS — E1140 Type 2 diabetes mellitus with diabetic neuropathy, unspecified: Secondary | ICD-10-CM | POA: Diagnosis present

## 2021-11-23 DIAGNOSIS — I82621 Acute embolism and thrombosis of deep veins of right upper extremity: Secondary | ICD-10-CM | POA: Diagnosis not present

## 2021-11-23 DIAGNOSIS — D631 Anemia in chronic kidney disease: Secondary | ICD-10-CM | POA: Diagnosis present

## 2021-11-23 DIAGNOSIS — J811 Chronic pulmonary edema: Secondary | ICD-10-CM | POA: Diagnosis not present

## 2021-11-23 DIAGNOSIS — E1151 Type 2 diabetes mellitus with diabetic peripheral angiopathy without gangrene: Secondary | ICD-10-CM | POA: Diagnosis present

## 2021-11-23 DIAGNOSIS — Z79891 Long term (current) use of opiate analgesic: Secondary | ICD-10-CM | POA: Diagnosis not present

## 2021-11-23 DIAGNOSIS — Z789 Other specified health status: Secondary | ICD-10-CM | POA: Diagnosis not present

## 2021-11-23 DIAGNOSIS — Z66 Do not resuscitate: Secondary | ICD-10-CM | POA: Diagnosis not present

## 2021-11-23 DIAGNOSIS — I509 Heart failure, unspecified: Secondary | ICD-10-CM | POA: Diagnosis not present

## 2021-11-23 DIAGNOSIS — C3432 Malignant neoplasm of lower lobe, left bronchus or lung: Secondary | ICD-10-CM | POA: Diagnosis present

## 2021-11-23 DIAGNOSIS — G8929 Other chronic pain: Secondary | ICD-10-CM | POA: Diagnosis not present

## 2021-11-23 DIAGNOSIS — R55 Syncope and collapse: Secondary | ICD-10-CM | POA: Diagnosis not present

## 2021-11-23 DIAGNOSIS — R911 Solitary pulmonary nodule: Secondary | ICD-10-CM | POA: Diagnosis not present

## 2021-11-23 DIAGNOSIS — J9601 Acute respiratory failure with hypoxia: Secondary | ICD-10-CM | POA: Diagnosis not present

## 2021-11-23 DIAGNOSIS — N186 End stage renal disease: Secondary | ICD-10-CM | POA: Diagnosis not present

## 2021-11-23 DIAGNOSIS — E440 Moderate protein-calorie malnutrition: Secondary | ICD-10-CM | POA: Diagnosis not present

## 2021-11-23 DIAGNOSIS — Z20822 Contact with and (suspected) exposure to covid-19: Secondary | ICD-10-CM | POA: Diagnosis present

## 2021-11-23 DIAGNOSIS — J449 Chronic obstructive pulmonary disease, unspecified: Secondary | ICD-10-CM | POA: Diagnosis not present

## 2021-11-23 DIAGNOSIS — N189 Chronic kidney disease, unspecified: Secondary | ICD-10-CM | POA: Diagnosis not present

## 2021-11-23 DIAGNOSIS — Z992 Dependence on renal dialysis: Secondary | ICD-10-CM | POA: Diagnosis not present

## 2021-11-23 DIAGNOSIS — L89322 Pressure ulcer of left buttock, stage 2: Secondary | ICD-10-CM | POA: Diagnosis not present

## 2021-11-23 LAB — COMPREHENSIVE METABOLIC PANEL
ALT: 12 U/L (ref 0–44)
AST: 18 U/L (ref 15–41)
Albumin: 2.5 g/dL — ABNORMAL LOW (ref 3.5–5.0)
Alkaline Phosphatase: 50 U/L (ref 38–126)
Anion gap: 14 (ref 5–15)
BUN: 189 mg/dL — ABNORMAL HIGH (ref 8–23)
CO2: 27 mmol/L (ref 22–32)
Calcium: 9.2 mg/dL (ref 8.9–10.3)
Chloride: 96 mmol/L — ABNORMAL LOW (ref 98–111)
Creatinine, Ser: 3.42 mg/dL — ABNORMAL HIGH (ref 0.44–1.00)
GFR, Estimated: 14 mL/min — ABNORMAL LOW (ref >60)
Glucose, Bld: 199 mg/dL — ABNORMAL HIGH (ref 70–99)
Potassium: 3.4 mmol/L — ABNORMAL LOW (ref 3.5–5.1)
Sodium: 137 mmol/L (ref 135–145)
Total Bilirubin: 0.3 mg/dL (ref 0.3–1.2)
Total Protein: 5.5 g/dL — ABNORMAL LOW (ref 6.5–8.1)

## 2021-11-23 LAB — CBC
HCT: 24.1 % — ABNORMAL LOW (ref 36.0–46.0)
Hemoglobin: 7.4 g/dL — ABNORMAL LOW (ref 12.0–15.0)
MCH: 26.8 pg (ref 26.0–34.0)
MCHC: 30.7 g/dL (ref 30.0–36.0)
MCV: 87.3 fL (ref 80.0–100.0)
Platelets: 260 10*3/uL (ref 150–400)
RBC: 2.76 MIL/uL — ABNORMAL LOW (ref 3.87–5.11)
RDW: 23.4 % — ABNORMAL HIGH (ref 11.5–15.5)
WBC: 9.9 10*3/uL (ref 4.0–10.5)
nRBC: 0 % (ref 0.0–0.2)

## 2021-11-23 LAB — GLUCOSE, CAPILLARY
Glucose-Capillary: 161 mg/dL — ABNORMAL HIGH (ref 70–99)
Glucose-Capillary: 238 mg/dL — ABNORMAL HIGH (ref 70–99)
Glucose-Capillary: 306 mg/dL — ABNORMAL HIGH (ref 70–99)
Glucose-Capillary: 204 mg/dL — ABNORMAL HIGH (ref 70–99)

## 2021-11-23 MED ORDER — POTASSIUM CHLORIDE CRYS ER 20 MEQ PO TBCR
40.0000 meq | EXTENDED_RELEASE_TABLET | Freq: Every day | ORAL | Status: AC
  Administered 2021-11-23 – 2021-11-24 (×2): 40 meq via ORAL
  Filled 2021-11-23 (×2): qty 2

## 2021-11-23 MED ORDER — SEVELAMER CARBONATE 800 MG PO TABS
800.0000 mg | ORAL_TABLET | Freq: Three times a day (TID) | ORAL | Status: DC
  Administered 2021-11-23 – 2021-12-08 (×35): 800 mg via ORAL
  Filled 2021-11-23 (×39): qty 1

## 2021-11-23 MED ORDER — FUROSEMIDE 10 MG/ML IJ SOLN
80.0000 mg | Freq: Two times a day (BID) | INTRAMUSCULAR | Status: AC
  Administered 2021-11-23 – 2021-11-24 (×2): 80 mg via INTRAVENOUS
  Filled 2021-11-23 (×2): qty 8

## 2021-11-23 MED ORDER — PANTOPRAZOLE SODIUM 40 MG PO TBEC
40.0000 mg | DELAYED_RELEASE_TABLET | Freq: Every day | ORAL | Status: DC
  Administered 2021-11-23 – 2021-12-07 (×14): 40 mg via ORAL
  Filled 2021-11-23 (×15): qty 1

## 2021-11-23 MED ORDER — SODIUM CHLORIDE 0.9 % IV SOLN
250.0000 mg | Freq: Every day | INTRAVENOUS | Status: AC
  Administered 2021-11-23 – 2021-11-24 (×2): 250 mg via INTRAVENOUS
  Filled 2021-11-23 (×2): qty 20

## 2021-11-23 MED ORDER — GABAPENTIN 100 MG PO CAPS
100.0000 mg | ORAL_CAPSULE | Freq: Once | ORAL | Status: AC
  Administered 2021-11-23: 100 mg via ORAL
  Filled 2021-11-23: qty 1

## 2021-11-23 MED ORDER — ALBUMIN HUMAN 25 % IV SOLN
25.0000 g | Freq: Four times a day (QID) | INTRAVENOUS | Status: AC
  Administered 2021-11-23 – 2021-11-24 (×3): 25 g via INTRAVENOUS
  Filled 2021-11-23 (×2): qty 100

## 2021-11-23 MED ORDER — INSULIN GLARGINE-YFGN 100 UNIT/ML ~~LOC~~ SOLN
10.0000 [IU] | Freq: Every day | SUBCUTANEOUS | Status: DC
Start: 2021-11-23 — End: 2021-11-29
  Administered 2021-11-23 – 2021-11-28 (×6): 10 [IU] via SUBCUTANEOUS
  Filled 2021-11-23 (×8): qty 0.1

## 2021-11-23 NOTE — Progress Notes (Signed)
Inpatient Diabetes Program Recommendations  AACE/ADA: New Consensus Statement on Inpatient Glycemic Control (2015)  Target Ranges:  Prepandial:   less than 140 mg/dL      Peak postprandial:   less than 180 mg/dL (1-2 hours)      Critically ill patients:  140 - 180 mg/dL   Lab Results  Component Value Date   GLUCAP 238 (H) 11/23/2021   HGBA1C 6.3 (H) 11/22/2021    Review of Glycemic Control  Latest Reference Range & Units 11/22/21 08:53 11/22/21 12:10 11/22/21 17:07 11/23/21 09:09 11/23/21 12:08  Glucose-Capillary 70 - 99 mg/dL 120 (H) 198 (H) 248 (H) 161 (H) 238 (H)   Diabetes history: DM 2 Outpatient Diabetes medications: Humalog 75/25 40 units q AM and 30 units q PM, Trulicity 1.5 mg weekly Current orders for Inpatient glycemic control:  Novolog 0-9 units tid with meals  Inpatient Diabetes Program Recommendations:    Consider adding Semglee 10 units daily while in the hospital.   Thanks,  Adah Perl, RN, BC-ADM Inpatient Diabetes Coordinator Pager [phone]  (8a-5p)

## 2021-11-23 NOTE — Progress Notes (Addendum)
HD#0 SUBJECTIVE:  Patient Summary: [name] [name] is a 71 y.o. with a pertinent PMH of DM2 and CKD stage 5, who presented with LOC and admitted for syncope.   Overnight Events: no acute events overnight    Interm History: States she feels poor again today. Complains of pain in legs. Did not remember lengthy discussion regarding HD yesterday. Says she is willing to talk to nephro about HD. Would like for [name] to call her sister, [name].   OBJECTIVE:  Vital Signs: Vitals:   11/22/21 1710 11/22/21 2048 11/23/21 0634 11/23/21 0813  BP: (!) 132/59 139/66 129/74 137/60  Pulse: 74 65 75 70  Resp: 17 18 17 14   Temp: 98.6 F (37 C) 97.7 F (36.5 C) 98.1 F (36.7 C) 98.3 F (36.8 C)  TempSrc:  Oral Oral Oral  SpO2: 100% 98% 99% 96%   Constitutional: Well-developed, well-nourished, more confused today, alert Neck: Normal range of motion.  Cardiovascular: Normal rate, regular rhythm, intact distal pulses. No gallop and no friction rub. No murmur heard.  Bilateral lower extremity edema right greater than left.  2+ to the knee right side, left 1+ Pulmonary: Non labored breathing on room air, crackles bilateral lower lobes Abdominal: Soft.  Hyperactive bowel sounds in right lower quadrant. Non distended. Improved tenderness, nonperitonitic Musculoskeletal: Normal range of motion. Tender lower extremities, right more edematous than left, nonpitting Neurological: more confused today Skin: Left lower extremities with erythema and warmth, wound left shin.    Pertinent Labs: CBC Latest Ref Rng & Units 11/23/2021 11/22/2021 11/21/2021  WBC 4.0 - 10.5 K/uL 9.9 18.7(H) 11.0(H)  Hemoglobin 12.0 - 15.0 g/dL 7.4(L) 8.4(L) 7.9(L)  Hematocrit 36.0 - 46.0 % 24.1(L) 27.6(L) 26.6(L)  Platelets 150 - 400 K/uL 260 282 265    CMP Latest Ref Rng & Units 11/23/2021 11/22/2021 11/21/2021  Glucose 70 - 99 mg/dL 199(H) 100(H) 199(H)  BUN 8 - 23 mg/dL 189(H) 186(H) 191(H)  Creatinine 0.44 - 1.00 mg/dL 3.42(H)  3.03(H) 3.12(H)  Sodium 135 - 145 mmol/L 137 141 138  Potassium 3.5 - 5.1 mmol/L 3.4(L) 3.3(L) 3.0(L)  Chloride 98 - 111 mmol/L 96(L) 98 97(L)  CO2 22 - 32 mmol/L 27 27 25   Calcium 8.9 - 10.3 mg/dL 9.2 9.8 9.3  Total Protein 6.5 - 8.1 g/dL 5.5(L) - 6.4(L)  Total Bilirubin 0.3 - 1.2 mg/dL 0.3 - 0.5  Alkaline Phos 38 - 126 U/L 50 - 55  AST 15 - 41 U/L 18 - 20  ALT 0 - 44 U/L 12 - 15     ASSESSMENT/PLAN:   Assessment:  #CKD stage 5 Produces urine, unclear volume or frequency. Patient with persistently elevated serum creatinine as well as BUN of 189.  CKD is managed by nephrology outpatient and they are currently recommending dialysis however she has declined HD in the past.  She states that her reason for not wanting to pursue HD is that she is afraid of what it means. She has symptomatic uremia contributing to syncope and declining functional status at home.  - Nephrology consult today - If patient chooses to start HD as we are recommending, she will need access - Will talk with her sister as well to offer more support  #Syncope Likely multifactorial etiologies including vasovagal from pain, situational from defecation, uremia and anemia. Anticipate this will improve if she accepts HD this admission.  Leukocytosis - resolved without intervention.    #Constipation Generalized abdominal pain.Imaging revealed large stool burden. 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  EF noted to be 45 to 50% at that time.  There was no wall motion abnormalities.  She was noted to have left ventricular hypertrophy, no valvulopathy, left atrium was noted to be severely dilated.  Patient is on torsemide milligrams twice daily per EMR as well as metolazone 3 times a week.   - ECHO shows mild worsening of EF to 40-45% in setting of worsening uremia   #T2DM Patient's last A1c was 02/2021 and noted to be 6.8.  Per EMR she takes 75/25 insulin 40 units in the a.m. and 30 units in  the evening as well as Trulicity 1.5 mg weekly.  Patient is also on Reglan with 3 times daily dosing. -We will start patient on sliding scale insulin and continue her home Reglan.  Will uptitrate as necessary.   #LLL nodules Patient with left lower lobe nodules noted on previous imaging.  However, on CT obtained this hospitalization there noted to be larger, with the largest measuring 2.4 cm.  At risk for malignancy or metastatic disease and an outpatient PET scan is recommended for further evaluation. Will plan to arrange for this after discharge.   #Renal mass  Patient noted to have interval enlargement of L kidney exophytic density (now 3.2 cm) compared to 02/2021 imaging, at risk for RCC. Will plan for abdominal MRI at some point during this hospitalization, but need her encephalopathy to improve to tolerate the study.  Signature: Delene Ruffini, MD  Internal Medicine Resident, PGY-1 [facility]  Pager: [phone] 10:06 AM, 11/23/2021   Please contact the on call pager after 5 pm and on weekends at [phone].

## 2021-11-23 NOTE — TOC Initial Note (Signed)
Transition of Care [facility]) - Initial/Assessment Note    Patient Details  Name: [name] [name] MRN: [id number] Date of Birth: [birth year]  Transition of Care [facility]) CM/SW Contact:    Marilu Favre, RN Phone Number: 11/23/2021, 10:17 AM  Clinical Narrative:                 Patient from home alone. Has an aide 7 days a week for 2.5 hours a day through [facility]. Patient also has a home health RN through [facility] ( confirmed with Anderson Malta with [facility]) .   Patient active with [facility] .   Patient has [place], 3 in 1 , and walker at home.   Await PT evaluation. Will need home health orders .   Expected Discharge Plan: [place] Barriers to Discharge: Continued Medical Work up   Patient Goals and CMS Choice Patient states their goals for this hospitalization and ongoing recovery are:: to return to home CMS Medicare.gov Compare Post Acute Care list provided to:: Patient Choice offered to / list presented to : Patient  Expected Discharge Plan and Services Expected Discharge Plan: [place]   Discharge Planning Services: CM Consult Post Acute Care Choice: [place] arrangements for the past 2 months: Apartment                   DME Agency: NA       HH Arranged: RN          Prior Living Arrangements/Services Living arrangements for the past 2 months: Apartment Lives with:: Self Patient language and need for interpreter reviewed:: Yes Do you feel safe going back to the place where you live?: Yes      Need for Family Participation in Patient Care: Yes (Comment) Care giver support system in place?: Yes (comment) Current home services: DME Criminal Activity/Legal Involvement Pertinent to Current Situation/Hospitalization: No - Comment as needed  Activities of Daily Living Home Assistive Devices/Equipment: Bedside commode/3-in-1 ADL Screening (condition at time of admission) Patient's cognitive ability adequate  to safely complete daily activities?: Yes Is the patient deaf or have difficulty hearing?: No Does the patient have difficulty seeing, even when wearing glasses/contacts?: No Does the patient have difficulty concentrating, remembering, or making decisions?: No Patient able to express need for assistance with ADLs?: Yes Does the patient have difficulty dressing or bathing?: Yes Independently performs ADLs?: No Communication: Independent Grooming: Dependent Feeding: Independent Bathing: Dependent Toileting: Dependent In/Out Bed: Dependent Walks in Home: Dependent Does the patient have difficulty walking or climbing stairs?: Yes Weakness of Legs: Both Weakness of Arms/Hands: Both  Permission Sought/Granted   Permission granted to share information with : No              Emotional Assessment Appearance:: Appears stated age Attitude/Demeanor/Rapport: Engaged Affect (typically observed): Accepting Orientation: : Oriented to Self, Oriented to Place, Oriented to  Time, Oriented to Situation Alcohol / Substance Use: Not Applicable Psych Involvement: No (comment)  Admission diagnosis:  Syncope [R55] Syncope, unspecified syncope type [R55] Anemia, unspecified type [D64.9] Chronic kidney disease, unspecified CKD stage [N18.9] Patient Active Problem List   Diagnosis Date Noted   Syncope 11/21/2021   Chronic diastolic (congestive) heart failure (HCC) 07/01/2021   Chronic pain syndrome 07/01/2021   COPD (chronic obstructive pulmonary disease) ([place]) 07/01/2021   Cellulitis of leg, right 07/01/2021   SARS-CoV-2 positive 02/21/2021   Increased weakness when ambulating 02/17/2021   Intractable nausea and vomiting 02/15/2021  Symptomatic anemia 01/24/2021   Anemia, unspecified 01/08/2020   Bacteremia 01/07/2020   UTI (urinary tract infection) 01/06/2020   Anemia 10/26/2019   S/P lumbar fusion 09/18/2019   Chronic CHF (congestive heart failure) ([place]) 03/16/2019   Leukocytosis  03/16/2019   Chest pain 11/27/2018   Pre-syncope 11/27/2018   Epigastric abdominal pain 11/27/2018   Solitary pulmonary nodule 11/06/2018   Hypertensive heart and renal disease, stage 1-4 or unspecified chronic kidney disease, with heart failure ([place]) 10/20/2018   CHF (congestive heart failure), NYHA class III, acute on chronic, systolic ([place]) 56/43/3295   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, unspecified extremity ([place]) 04/19/2016   Cellulitis of abdominal wall 03/21/2015   Acute on chronic renal failure ([place]) 03/21/2015   DM (diabetes mellitus), type 2 with renal complications ([place]) 18/84/1660   Abdominal wall abscess 03/21/2015   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   Low urine output 08/11/2014   Acute on chronic systolic CHF (congestive heart failure), NYHA class 3 ([place]) 08/11/2014   Asthma, chronic 08/11/2014   Hypothyroidism 08/11/2014   OSA (obstructive sleep apnea) 08/11/2014   Positive cardiac stress test 06/29/2014   Hyperlipidemia 02/12/2014   Left arm weakness 12/23/2013   TIA (transient ischemic attack) 12/23/2013   Left buttock abscess 10/12/2013   Cellulitis and abscess of leg, right 04/16/2013   Acute blood loss anemia 02/14/2013   HTN (hypertension) 02/14/2013   Cellulitis and abscess of buttock 02/09/2013   Class 2 obesity due to excess calories with body mass index (BMI) of 36.0 to 36.9 in adult 02/03/2013   Diarrhea 02/03/2013   Hyponatremia 02/03/2013   Sepsis due to undetermined organism ([place]) 02/02/2013   Diabetes mellitus without complication ([place]) 63/10/6008   CKD (chronic kidney disease), stage IV ([place]) 02/02/2013   Cellulitis 02/02/2013   PAD (peripheral artery disease) ([place]) 04/26/2011   Tobacco abuse 04/26/2011   PCP:  [name], MD Pharmacy:   [facility] [place], [address] AT [address] [address] Phone:  [phone] Fax: [phone]  [facility] - [place]), [place] - [address] [place]  [address] Phone: [phone] Fax: [phone]     Social Determinants of Health (SDOH) Interventions    Readmission Risk Interventions Readmission Risk Prevention Plan 01/14/2020  Transportation Screening Complete  Medication Review (RN Care Manager) Complete  PCP or Specialist appointment within 3-5 days of discharge Complete  HRI or Home Care Consult Complete  SW Recovery Care/Counseling Consult Complete  Palliative Care Screening Not Applicable  Skilled Nursing Facility Complete  Some recent data might be hidden

## 2021-11-23 NOTE — Evaluation (Signed)
Physical Therapy Evaluation Patient Details Name: [name] [name] MRN: [id number] DOB: August 19, 1951 Today's Date: 11/23/2021  History of Present Illness  Pt is a 71 year old woman who came to the ED on 11/21/21 after syncopal after straining on toilet (thought to be vasovagal), abdominal pain, vomiting and constipation. Pulmonary and renal imaging concerning for malignancy. PMH: COPD, CKD5, CHF with EF of 40-45%, OSA, gout, iron deficiency, low back pain, LE venous insufficiency with L LE wound, HTN, TIA, DM2.  Clinical Impression  Pt was seen for progression of mobility on RW to attempt standing, then required two person help to get to stand with chair pad and bar of standing scale.  Pt is essentially using a set up that mimics the [place], and so will recommend to nursing to either use the maxi move as is set up under the chair pad, or the stedy to get back to bed.  Pt is edematous on LE's, weaker to stand and unable to generate the strength to move well.  Recommend SNF care due to her weakness with struggle to stand alone, but will increase her frequency to 3x per week since she has not fully committed to going to rehab yet.  Her pain in feet as well as LE edema and weakness are making her unable to stand, unsafe for home yet.   Recommendations for follow up therapy are one component of a multi-disciplinary discharge planning process, led by the attending physician.  Recommendations may be updated based on patient status, additional functional criteria and insurance authorization.  Follow Up Recommendations Skilled nursing-short term rehab (<3 hours/day)    Assistance Recommended at Discharge Frequent or constant Supervision/Assistance  Patient can return home with the following  Two people to help with walking and/or transfers;A lot of help with bathing/dressing/bathroom;Assistance with cooking/housework;Assist for transportation;Help with stairs or ramp for entrance    Equipment Recommendations  None recommended by PT  Recommendations for Other Services       Functional Status Assessment Patient has had a recent decline in their functional status and demonstrates the ability to make significant improvements in function in a reasonable and predictable amount of time.     Precautions / Restrictions Precautions Precautions: Fall Precaution Comments: standing control is poor until fully upright Restrictions Weight Bearing Restrictions: No      Mobility  Bed Mobility               General bed mobility comments: up in chair when PT arrived    Transfers Overall transfer level: Needs assistance Equipment used: 2 person hand held assist Transfers: Sit to/from Stand Sit to Stand: Max assist, +2 physical assistance, +2 safety/equipment           General transfer comment: 2 max once up in chair and a bit tired, lifted with chair pad to stand for wgt but once up is min assist to maintain balance    Ambulation/Gait               General Gait Details: did not walk prior to admission  Stairs            Wheelchair Mobility    Modified Rankin (Stroke Patients Only)       Balance Overall balance assessment: Needs assistance   Sitting balance-Leahy Scale: Fair   Postural control: Posterior lean, Right lateral lean Standing balance support: Bilateral upper extremity supported, During functional activity Standing balance-Leahy Scale: Poor Standing balance comment: BUE support on standing wgt machine  Pertinent Vitals/Pain Pain Assessment Pain Assessment: Faces Faces Pain Scale: Hurts even more Pain Location: bottoms of feet Pain Descriptors / Indicators: Guarding Pain Intervention(s): Limited activity within patient's tolerance, Monitored during session, Repositioned    Home Living Family/patient expects to be discharged to:: Private residence Living Arrangements: Alone Available Help at Discharge:  Personal care attendant;Family;Available PRN/intermittently Type of Home: Apartment Home Access: Ramped entrance       Home Layout: One level Home Equipment: Conservation officer, nature (2 wheels);Wheelchair - power;BSC/3in1;Tub bench;Hand held shower head Additional Comments: has aide for assistance of her care but usually can get bed to chair to use power device with min guard or alone    Prior Function Prior Level of Function : Needs assist             Mobility Comments: independent to power chair       Hand Dominance   Dominant Hand: Right    Extremity/Trunk Assessment   Upper Extremity Assessment Upper Extremity Assessment: Defer to OT evaluation    Lower Extremity Assessment Lower Extremity Assessment: Generalized weakness    Cervical / Trunk Assessment Cervical / Trunk Assessment: Kyphotic (able to move back to shift forward in chair)  Communication   Communication: HOH  Cognition Arousal/Alertness: Awake/alert Behavior During Therapy: Flat affect Overall Cognitive Status: Impaired/Different from baseline Area of Impairment: Orientation, Attention, Memory, Following commands, Safety/judgement, Awareness, Problem solving                 Orientation Level: Situation, Time Current Attention Level: Selective Memory: Decreased short-term memory Following Commands: Follows one step commands with increased time Safety/Judgement: Decreased awareness of deficits, Decreased awareness of safety Awareness: Intellectual Problem Solving: Slow processing, Requires verbal cues General Comments: pt is not aware of the issues she has with safety to stand, wants to go directly home with family when 2 are needed to stand from chair or a lift        General Comments General comments (skin integrity, edema, etc.): pt is in chair when PT arrived and was attempted to stand with one unsuccessfully, then with two and chair pad successfully 2 max assist, then min assist once fully  standing at bar    Exercises     Assessment/Plan    PT Assessment Patient needs continued PT services  PT Problem List Decreased strength;Decreased range of motion;Decreased activity tolerance;Decreased balance;Decreased mobility;Decreased coordination;Decreased cognition;Decreased knowledge of use of DME;Decreased safety awareness;Decreased skin integrity;Pain       PT Treatment Interventions DME instruction;Gait training;Functional mobility training;Therapeutic activities;Therapeutic exercise;Balance training;Neuromuscular re-education;Patient/family education    PT Goals (Current goals can be found in the Care Plan section)  Acute Rehab PT Goals Patient Stated Goal: to go home with her sister PT Goal Formulation: With patient Time For Goal Achievement: 12/07/21 Potential to Achieve Goals: Good    Frequency Min 3X/week     Co-evaluation               AM-PAC PT "6 Clicks" Mobility  Outcome Measure Help needed turning from your back to your side while in a flat bed without using bedrails?: A Little Help needed moving from lying on your back to sitting on the side of a flat bed without using bedrails?: A Little Help needed moving to and from a bed to a chair (including a wheelchair)?: Total Help needed standing up from a chair using your arms (e.g., wheelchair or bedside chair)?: Total Help needed to walk in hospital room?: Total Help needed climbing 3-5 steps  with a railing? : Total 6 Click Score: 10    End of Session Equipment Utilized During Treatment: Gait belt Activity Tolerance: Patient limited by fatigue;Treatment limited secondary to medical complications (Comment) Patient left: in chair;with call bell/phone within reach;with chair alarm set Nurse Communication: Mobility status PT Visit Diagnosis: Unsteadiness on feet (R26.81);Muscle weakness (generalized) (M62.81);Difficulty in walking, not elsewhere classified (R26.2);Pain Pain - Right/Left:  (both) Pain -  part of body: Ankle and joints of foot    Time: 7408-1448 PT Time Calculation (min) (ACUTE ONLY): 32 min   Charges:     PT Treatments $Therapeutic Activity: 8-22 mins       Ramond Dial 11/23/2021, 6:58 PM  Mee Hives, PT PhD Acute Rehab Dept. Number: [place]

## 2021-11-23 NOTE — Consult Note (Addendum)
[facility] Nephrology Consultation Note  Requesting MD: Dr. Velna Ochs Reason for consult: CKD5  HPI:  [name] [name] is a 71 y.o. female with obesity, hypertension, DM, COPD, diastolic CHF, CKD, who was admitted for syncopal episode seen as a consultation for the management of chronic kidney disease and azotemia. The patient has longstanding CKD 4/5 who underwent left first stage basilic vein transposition in 03/2020, follows at [place] but hesitant to start dialysis.  Her baseline serum creatinine level seems to be around 2.5-3.5 with multiple episodes of AKI's.  On admission, the creatinine level was 3.12, BUN 191, potassium 3.  It seems like patient got some fluid with elevated creatinine level of 3.42 and BUN 189. CT abdomen pelvis with exophytic soft soft tissue arising from left kidney concerning for renal cell carcinoma, no hydronephrosis or obstruction.   Apparently she is on torsemide 100 mg twice a day and metolazone at home for the management of CHF/volume overload. Currently patient reports weakness, some nausea but denies vomiting, chest pain or shortness of breath.  She is very anxious and does not want start dialysis.  Denies dysgeusia, headache or dizziness.  Creatinine, Ser  Date/Time Value Ref Range Status  11/23/2021 01:30 AM 3.42 (H) 0.44 - 1.00 mg/dL Final  11/22/2021 02:59 AM 3.03 (H) 0.44 - 1.00 mg/dL Final  11/21/2021 02:13 PM 3.12 (H) 0.44 - 1.00 mg/dL Final  09/07/2021 10:36 AM 3.55 (H) 0.44 - 1.00 mg/dL Final  08/10/2021 10:57 AM 3.95 (H) 0.44 - 1.00 mg/dL Final  07/13/2021 11:38 AM 2.69 (H) 0.44 - 1.00 mg/dL Final  07/10/2021 03:05 AM 2.41 (H) 0.44 - 1.00 mg/dL Final  07/09/2021 05:47 AM 2.49 (H) 0.44 - 1.00 mg/dL Final  07/08/2021 02:29 AM 2.53 (H) 0.44 - 1.00 mg/dL Final  07/07/2021 03:03 AM 2.67 (H) 0.44 - 1.00 mg/dL Final    PMHx:   Past Medical History:  Diagnosis Date   Anemia    Arthritis    Asthma    Chronic  diastolic (congestive) heart failure (HCC)    Chronic kidney disease    Chronic pain syndrome    Class 2 obesity due to excess calories with body mass index (BMI) of 36.0 to 36.9 in adult    COPD (chronic obstructive pulmonary disease) (HCC)    Diabetes mellitus without complication (HCC)    Full dentures    GERD (gastroesophageal reflux disease)    Gout    History of hiatal hernia    History of transfusion    Hx MRSA infection    abscess left groin   Hyperlipidemia    Hypertension    Hypothyroid    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[place])    Peripheral vascular disease ([place])    Sleep apnea    UNABLE TO TOLERATE C PAP   Stress incontinence    TIA (transient ischemic attack)     X2 NO RESIDUAL PROBLEMS   Wears glasses     Past Surgical History:  Procedure Laterality Date   ABDOMINAL HYSTERECTOMY     BACK SURGERY  1779   BASCILIC VEIN TRANSPOSITION Left 04/05/2020   Procedure: BASILIC VEIN TRANSPOSITION FIRST STAGE;  Surgeon: Angelia Mould, MD;  Location: [place];  Service: Vascular;  Laterality: Left;   COLON SURGERY  1995   DUE TO POLYP   DILATION AND CURETTAGE OF UTERUS     ENTEROSCOPY N/A 07/10/2021   Procedure: ENTEROSCOPY;  Surgeon: Ronnette Juniper, MD;  Location: [place];  Service: Gastroenterology;  Laterality: N/A;   ESOPHAGOGASTRODUODENOSCOPY N/A 01/25/2021   Procedure: ESOPHAGOGASTRODUODENOSCOPY (EGD);  Surgeon: Clarene Essex, MD;  Location: [facility];  Service: Endoscopy;  Laterality: N/A;   FOREARM FRACTURE SURGERY     Left arm   GIVENS CAPSULE STUDY N/A 07/07/2021   Procedure: GIVENS CAPSULE STUDY;  Surgeon: Clarene Essex, MD;  Location: [place];  Service: Endoscopy;  Laterality: N/A;   HERNIA REPAIR     w/ mesh   HOT HEMOSTASIS N/A 07/10/2021   Procedure: HOT HEMOSTASIS (ARGON PLASMA COAGULATION/BICAP);  Surgeon: Ronnette Juniper, MD;  Location: [place];  Service: Gastroenterology;  Laterality: N/A;   INCISION AND DRAINAGE ABSCESS Left 02/04/2013    Procedure: INCISION AND DRAINAGE LEFT BUTTOCK ABSCESS; INCISION AND DRAINAGE LEFT BREAST ABSCESS;  Surgeon: Harl Bowie, MD;  Location: [place];  Service: General;  Laterality: Left;   INCISION AND DRAINAGE ABSCESS N/A 02/12/2013   Procedure: INCISION AND DEBRIDEMENT BUTTOCK WOUND ;  Surgeon: [name]. Georgette Dover, MD;  Location: [place];  Service: General;  Laterality: N/A;   INCISION AND DRAINAGE ABSCESS N/A 02/14/2013   Procedure: INCISION AND DRAINAGE/DRESSING CHANGE;  Surgeon: Harl Bowie, MD;  Location: [place];  Service: General;  Laterality: N/A;   INCISION AND DRAINAGE ABSCESS N/A 03/21/2015   Procedure: INCISION AND DRAINAGE PUBIC ABSCESS;  Surgeon: Excell Seltzer, MD;  [facility];  Service: General;  Laterality: N/A;   INCISION AND DRAINAGE PERIRECTAL ABSCESS Left 02/10/2013   Procedure: IRRIGATION AND DEBRIDEMENT OF BUTTOCK/PERINEAL ABSCESS;  Surgeon: [name]. Georgette Dover, MD;  Location: [place];  Service: General;  Laterality: Left;   INCISION AND DRAINAGE PERIRECTAL ABSCESS N/A 02/16/2013   Procedure: IRRIGATION AND DEBRIDEMENT PERINEAL ABSCESS;  Surgeon: Zenovia Jarred, MD;  Location: [place];  Service: General;  Laterality: N/A;   IRRIGATION AND DEBRIDEMENT ABSCESS N/A 02/06/2013   Procedure: IRRIGATION AND DEBRIDEMENT BUTTOCK ABSCESS AND DRESSING CHANGE;  Surgeon: Harl Bowie, MD;  Location: [place];  Service: General;  Laterality: N/A;   IRRIGATION AND DEBRIDEMENT ABSCESS Left 02/08/2013   Procedure: IRRIGATION AND DEBRIDEMENT ABSCESS/DRESSING CHANGE;  Surgeon: Gwenyth Ober, MD;  Location: [place];  Service: General;  Laterality: Left;   JOINT REPLACEMENT  2010 / 2012    [place] N/A 07/02/2014   Procedure: LEFT HEART CATHETERIZATION WITH CORONARY ANGIOGRAM;  Surgeon: Lorretta Harp, MD;  Location: [facility];  Service: Cardiovascular;  Laterality: N/A;   LOWER EXTREMITY ANGIOGRAM Right 04/20/2016    Procedure: Lower Extremity Angiogram;  Surgeon: Elam Dutch, MD;  Location: [place] [facility];  Service: Cardiovascular;  Laterality: Right;   MULTIPLE TOOTH EXTRACTIONS     PERIPHERAL VASCULAR CATHETERIZATION N/A 04/20/2016   Procedure: Abdominal Aortogram;  Surgeon: Elam Dutch, MD;  Location: [place] [facility];  Service: Cardiovascular;  Laterality: N/A;   PERIPHERAL VASCULAR CATHETERIZATION Right 04/20/2016   Procedure: Peripheral Vascular Intervention;  Surgeon: Elam Dutch, MD;  Location: [place] [facility];  Service: Cardiovascular;  Laterality: Right;  [place]   RIGHT HEART CATH N/A 10/27/2018   Procedure: RIGHT HEART CATH;  Surgeon: Larey Dresser, MD;  Location: [place] [facility];  Service: Cardiovascular;  Laterality: N/A;   RIGHT HEART CATH N/A 03/20/2019   Procedure: RIGHT HEART CATH;  Surgeon: Larey Dresser, MD;  Location: [place] [facility];  Service: Cardiovascular;  Laterality: N/A;   THYROIDECTOMY     TOTAL HIP REVISION Right  08/11/2014   Procedure: RIGHT ACETABULAR REVISION;  Surgeon: Gearlean Alf, MD;  [facility];  Service: Orthopedics;  Laterality: Right;   VASCULAR SURGERY      Family Hx:  Family History  Problem Relation Age of Onset   Diabetes Brother    Cardiomyopathy Mother    Heart disease Mother    Cancer Father     Social History:  reports that she has been smoking cigarettes. She has a 10.00 pack-year smoking history. She has never used smokeless tobacco. She reports current alcohol use of about 1.0 standard drink per week. She reports that she does not use drugs.  Allergies:  Allergies  Allergen Reactions   Nebivolol Swelling    Chest pain (reaction to Bystolic)   Ace Inhibitors Swelling and Other (See Comments)    Tongue swell   Morphine And Related Itching    Medications: Prior to Admission medications   Medication Sig Start Date End Date Taking? Authorizing Provider  allopurinol (ZYLOPRIM) 100 MG tablet Take 1  tablet (100 mg total) by mouth daily. 03/05/21   Medina-Vargas, Monina C, NP  carvedilol (COREG) 12.5 MG tablet Take 1 tablet (12.5 mg total) by mouth 2 (two) times daily. 03/05/21   Medina-Vargas, Monina C, NP  cinacalcet (SENSIPAR) 30 MG tablet Take 1 tablet (30 mg total) by mouth daily. Patient not taking: No sig reported 03/05/21   Medina-Vargas, Monina C, NP  diclofenac Sodium (VOLTAREN) 1 % GEL Apply 1 application topically 4 (four) times daily as needed (pain). 08/15/20   [provider]  hydrOXYzine (ATARAX/VISTARIL) 25 MG tablet Take 1 tablet (25 mg total) by mouth 2 (two) times daily as needed for anxiety. 03/05/21   Medina-Vargas, Monina C, NP  insulin lispro protamine-lispro (HUMALOG 75/25 MIX) (75-25) 100 UNIT/ML SUSP injection Inject 30-40 Units into the skin See admin instructions. Inject 40 units into the skin morning and 30 units in the evening    [provider]  levothyroxine (SYNTHROID) 200 MCG tablet Take 1 tablet (200 mcg total) by mouth daily at 6 (six) AM. 03/05/21   Medina-Vargas, Monina C, NP  lubiprostone (AMITIZA) 24 MCG capsule Take 24 mcg by mouth 2 (two) times daily. 06/06/21   [provider]  magnesium oxide (MAG-OX) 400 (241.3 Mg) MG tablet Take 1 tablet (400 mg total) by mouth 2 (two) times daily. 01/14/20   Lavina Hamman, MD  metoCLOPramide (REGLAN) 5 MG tablet Take 1 tablet (5 mg total) by mouth 3 (three) times daily. 03/05/21   Medina-Vargas, Monina C, NP  metolazone (ZAROXOLYN) 5 MG tablet Take 1 tablet (5 mg total) by mouth 3 (three) times a week. Takes 5mg  on Monday, Wednesday and Friday Patient not taking: No sig reported 03/06/21   Medina-Vargas, Monina C, NP  mirabegron ER (MYRBETRIQ) 50 MG TB24 tablet Take 50 mg by mouth daily.    [provider]  Multiple Vitamin (MULTIVITAMIN WITH MINERALS) TABS tablet Take 1 tablet by mouth daily.    [provider]  oxyCODONE-acetaminophen (PERCOCET) 10-325 MG tablet Take 1 tablet by  mouth 4 (four) times daily as needed for pain. 07/10/21   Amin, Jeanella Flattery, MD  pantoprazole (PROTONIX) 40 MG tablet Take 1 tablet (40 mg total) by mouth 2 (two) times daily before a meal. 07/10/21   Amin, Jeanella Flattery, MD  polyethylene glycol (MIRALAX / GLYCOLAX) 17 g packet Take 17 g by mouth daily. 07/10/21   Amin, Jeanella Flattery, MD  polyvinyl alcohol (LIQUIFILM TEARS) 1.4 %  ophthalmic solution Place 2 drops into both eyes daily as needed for dry eyes.    [provider]  Potassium Chloride ER 20 MEQ TBCR Take 1 tablet by mouth 3 (three) times a week. Tuesday, Thursdays and Saturdays 03/06/21   Medina-Vargas, Monina C, NP  senna-docusate (SENOKOT-S) 8.6-50 MG tablet Take 1 tablet by mouth at bedtime as needed for moderate constipation. 07/10/21   Amin, Jeanella Flattery, MD  torsemide (DEMADEX) 100 MG tablet Take 1 tablet (100 mg total) by mouth 2 (two) times daily. 03/05/21   Medina-Vargas, Monina C, NP  TRULICITY 1.5 TD/1.7OH SOPN Inject 1.5 mg into the skin once a week. Monday Patient taking differently: Inject 1.5 mg into the skin every Monday. 03/05/21   Medina-Vargas, Monina C, NP    I have reviewed the patient's current medications.  Labs:  Results for orders placed or performed during the hospital encounter of 11/21/21 (from the past 48 hour(s))  CBC with Differential     Status: Abnormal   Collection Time: 11/21/21  2:13 PM  Result Value Ref Range   WBC 11.0 (H) 4.0 - 10.5 K/uL   RBC 2.96 (L) 3.87 - 5.11 MIL/uL   Hemoglobin 7.9 (L) 12.0 - 15.0 g/dL   HCT 26.6 (L) 36.0 - 46.0 %   MCV 89.9 80.0 - 100.0 fL   MCH 26.7 26.0 - 34.0 pg   MCHC 29.7 (L) 30.0 - 36.0 g/dL   RDW 23.5 (H) 11.5 - 15.5 %   Platelets 265 150 - 400 K/uL   nRBC 0.0 0.0 - 0.2 %   Neutrophils Relative % 85 %   Neutro Abs 9.3 (H) 1.7 - 7.7 K/uL   Lymphocytes Relative 8 %   Lymphs Abs 0.9 0.7 - 4.0 K/uL   Monocytes Relative 5 %   Monocytes Absolute 0.6 0.1 - 1.0 K/uL   Eosinophils Relative 1 %   Eosinophils  Absolute 0.1 0.0 - 0.5 K/uL   Basophils Relative 0 %   Basophils Absolute 0.0 0.0 - 0.1 K/uL   Immature Granulocytes 1 %   Abs Immature Granulocytes 0.09 (H) 0.00 - 0.07 K/uL    Comment: Performed at [facility], [address] [place], [place] [address]  Comprehensive metabolic panel     Status: Abnormal   Collection Time: 11/21/21  2:13 PM  Result Value Ref Range   Sodium 138 135 - 145 mmol/L   Potassium 3.0 (L) 3.5 - 5.1 mmol/L   Chloride 97 (L) 98 - 111 mmol/L   CO2 25 22 - 32 mmol/L   Glucose, Bld 199 (H) 70 - 99 mg/dL    Comment: Glucose reference range applies only to samples taken after fasting for at least 8 hours.   BUN 191 (H) 8 - 23 mg/dL   Creatinine, Ser 3.12 (H) 0.44 - 1.00 mg/dL   Calcium 9.3 8.9 - 10.3 mg/dL   Total Protein 6.4 (L) 6.5 - 8.1 g/dL   Albumin 3.0 (L) 3.5 - 5.0 g/dL   AST 20 15 - 41 U/L   ALT 15 0 - 44 U/L   Alkaline Phosphatase 55 38 - 126 U/L   Total Bilirubin 0.5 0.3 - 1.2 mg/dL   GFR, Estimated 15 (L) >60 mL/min    Comment: (NOTE) Calculated using the CKD-EPI Creatinine Equation (2021)    Anion gap 16 (H) 5 - 15    Comment: Performed at [facility], [place] [address] [place],  [address]  Lipase, blood  Status: Abnormal   Collection Time: 11/21/21  2:13 PM  Result Value Ref Range   Lipase 65 (H) 11 - 51 U/L    Comment: Performed at [facility] [address] [place], [place] [address]  Troponin I (High Sensitivity)     Status: Abnormal   Collection Time: 11/21/21  2:13 PM  Result Value Ref Range   Troponin I (High Sensitivity) 80 (H) <18 ng/L    Comment: (NOTE) Elevated high sensitivity troponin I (hsTnI) values and significant  changes across serial measurements may suggest ACS but many other  chronic and acute conditions are known to elevate hsTnI results.  Refer to the Links section for chest pain algorithms and additional  guidance. Performed at [facility], [place] [address] [place],  [place] [address]   Troponin I (High Sensitivity)     Status: Abnormal   Collection Time: 11/21/21  4:05 PM  Result Value Ref Range   Troponin I (High Sensitivity) 84 (H) <18 ng/L    Comment: (NOTE) Elevated high sensitivity troponin I (hsTnI) values and significant  changes across serial measurements may suggest ACS but many other  chronic and acute conditions are known to elevate hsTnI results.  Refer to the "Links" section for chest pain algorithms and additional  guidance. Performed at [facility], [place] [address] [place], [place] [address]   POC occult blood, ED Provider will collect     Status: None   Collection Time: 11/21/21  4:07 PM  Result Value Ref Range   Fecal Occult Bld NEGATIVE NEGATIVE  POC CBG, ED     Status: Abnormal   Collection Time: 11/21/21  4:12 PM  Result Value Ref Range   Glucose-Capillary 174 (H) 70 - 99 mg/dL    Comment: Glucose reference range applies only to samples taken after fasting for at least 8 hours.  Urinalysis, Routine w reflex microscopic     Status: None   Collection Time: 11/21/21  6:09 PM  Result Value Ref Range   Color, Urine YELLOW YELLOW   APPearance CLEAR CLEAR   Specific Gravity, Urine 1.010 1.005 - 1.030   pH 5.0 5.0 - 8.0   Glucose, UA NEGATIVE NEGATIVE mg/dL   Hgb urine dipstick NEGATIVE NEGATIVE   Bilirubin Urine NEGATIVE NEGATIVE   Ketones, ur NEGATIVE NEGATIVE mg/dL   Protein, ur NEGATIVE NEGATIVE mg/dL   Nitrite NEGATIVE NEGATIVE   Leukocytes,Ua NEGATIVE NEGATIVE    Comment: Performed at [facility] [address], [place], [place] [address]  Basic metabolic panel     Status: Abnormal   Collection Time: 11/22/21  2:59 AM  Result Value Ref Range   Sodium 141 135 - 145 mmol/L   Potassium 3.3 (L) 3.5 - 5.1 mmol/L   Chloride 98 98 - 111 mmol/L   CO2 27 22 - 32 mmol/L   Glucose, Bld 100 (H) 70 - 99 mg/dL    Comment: Glucose reference range applies only to samples taken after fasting for at least 8 hours.   BUN 186  (H) 8 - 23 mg/dL   Creatinine, Ser 3.03 (H) 0.44 - 1.00 mg/dL   Calcium 9.8 8.9 - 10.3 mg/dL   GFR, Estimated 16 (L) >60 mL/min    Comment: (NOTE) Calculated using the CKD-EPI Creatinine Equation (2021)    Anion gap 16 (H) 5 - 15    Comment: Performed at [facility] [address], [place], [place] [address]  CBC  Status: Abnormal   Collection Time: 11/22/21  2:59 AM  Result Value Ref Range   WBC 18.7 (H) 4.0 - 10.5 K/uL   RBC 3.12 (L) 3.87 - 5.11 MIL/uL   Hemoglobin 8.4 (L) 12.0 - 15.0 g/dL   HCT 27.6 (L) 36.0 - 46.0 %   MCV 88.5 80.0 - 100.0 fL   MCH 26.9 26.0 - 34.0 pg   MCHC 30.4 30.0 - 36.0 g/dL   RDW 23.4 (H) 11.5 - 15.5 %   Platelets 282 150 - 400 K/uL   nRBC 0.0 0.0 - 0.2 %    Comment: Performed at [facility] [address], [place], [place] [address]  Magnesium     Status: None   Collection Time: 11/22/21  3:00 AM  Result Value Ref Range   Magnesium 1.9 1.7 - 2.4 mg/dL    Comment: Performed at [facility] [address] [place], [place] [address]  Phosphorus     Status: Abnormal   Collection Time: 11/22/21  3:00 AM  Result Value Ref Range   Phosphorus 6.1 (H) 2.5 - 4.6 mg/dL    Comment: Performed at [facility] [address], [place], [place] [address]  Iron and TIBC     Status: Abnormal   Collection Time: 11/22/21  3:00 AM  Result Value Ref Range   Iron 31 28 - 170 ug/dL   TIBC 385 250 - 450 ug/dL   Saturation Ratios 8 (L) 10.4 - 31.8 %   UIBC 354 ug/dL    Comment: Performed at [facility], [place] [address] [place], [place] [address]  Ferritin     Status: None   Collection Time: 11/22/21  3:00 AM  Result Value Ref Range   Ferritin 23 11 - 307 ng/mL    Comment: Performed at [facility], [place] [address], [place], [place] [address]  Hemoglobin A1c     Status: Abnormal   Collection Time: 11/22/21  3:01 AM  Result Value Ref Range   Hgb A1c MFr Bld 6.3 (H) 4.8 - 5.6 %    Comment: (NOTE) Pre diabetes:           5.7%-6.4%  Diabetes:              >6.4%  Glycemic control for   <7.0% adults with diabetes    Mean Plasma Glucose 134.11 mg/dL    Comment: Performed at [facility] [address] [place], [place] [address]  TSH     Status: None   Collection Time: 11/22/21  3:01 AM  Result Value Ref Range   TSH 3.860 0.350 - 4.500 uIU/mL    Comment: Performed by a 3rd Generation assay with a functional sensitivity of <=0.01 uIU/mL. Performed at [facility], [place] [address] [place],  [address]   CBG monitoring, ED     Status: Abnormal   Collection Time: 11/22/21  4:09 AM  Result Value Ref Range   Glucose-Capillary 100 (H) 70 - 99 mg/dL    Comment: Glucose reference range applies only to samples taken after fasting for at least 8 hours.  CBG monitoring, ED     Status: Abnormal   Collection Time: 11/22/21  8:53 AM  Result Value Ref Range   Glucose-Capillary 120 (H) 70 - 99 mg/dL    Comment: Glucose reference range applies only to samples taken after fasting for at least 8 hours.  CBG monitoring, ED  Status: Abnormal   Collection Time: 11/22/21 12:10 PM  Result Value Ref Range   Glucose-Capillary 198 (H) 70 - 99 mg/dL    Comment: Glucose reference range applies only to samples taken after fasting for at least 8 hours.  Glucose, capillary     Status: Abnormal   Collection Time: 11/22/21  5:07 PM  Result Value Ref Range   Glucose-Capillary 248 (H) 70 - 99 mg/dL    Comment: Glucose reference range applies only to samples taken after fasting for at least 8 hours.  CBC     Status: Abnormal   Collection Time: 11/23/21  1:30 AM  Result Value Ref Range   WBC 9.9 4.0 - 10.5 K/uL   RBC 2.76 (L) 3.87 - 5.11 MIL/uL   Hemoglobin 7.4 (L) 12.0 - 15.0 g/dL   HCT 24.1 (L) 36.0 - 46.0 %   MCV 87.3 80.0 - 100.0 fL   MCH 26.8 26.0 - 34.0 pg   MCHC 30.7 30.0 - 36.0 g/dL   RDW 23.4 (H) 11.5 - 15.5 %   Platelets 260 150 - 400 K/uL   nRBC 0.0 0.0 - 0.2 %    Comment: Performed at [facility] [address] [place], [place] [address]  Comprehensive metabolic panel     Status: Abnormal   Collection Time: 11/23/21  1:30 AM  Result Value Ref Range   Sodium 137 135 - 145 mmol/L   Potassium 3.4 (L) 3.5 - 5.1 mmol/L   Chloride 96 (L) 98 - 111 mmol/L   CO2 27 22 - 32 mmol/L   Glucose, Bld 199 (H) 70 - 99 mg/dL    Comment: Glucose reference range applies only to samples taken after fasting for at least 8 hours.   BUN 189 (H) 8 - 23 mg/dL   Creatinine, Ser 3.42 (H) 0.44 - 1.00 mg/dL   Calcium 9.2 8.9 - 10.3 mg/dL   Total Protein 5.5 (L) 6.5 - 8.1 g/dL   Albumin 2.5 (L) 3.5 - 5.0 g/dL   AST 18 15 - 41 U/L   ALT 12 0 - 44 U/L   Alkaline Phosphatase 50 38 - 126 U/L   Total Bilirubin 0.3 0.3 - 1.2 mg/dL   GFR, Estimated 14 (L) >60 mL/min    Comment: (NOTE) Calculated using the CKD-EPI Creatinine Equation (2021)    Anion gap 14 5 - 15    Comment: Performed at [facility], [place] [address] [place], [place] [address]  Glucose, capillary     Status: Abnormal   Collection Time: 11/23/21  9:09 AM  Result Value Ref Range   Glucose-Capillary 161 (H) 70 - 99 mg/dL    Comment: Glucose reference range applies only to samples taken after fasting for at least 8 hours.     ROS:  Pertinent items noted in HPI and remainder of comprehensive ROS otherwise negative.  Physical Exam: Vitals:   11/23/21 0634 11/23/21 0813  BP: 129/74 137/60  Pulse: 75 70  Resp: 17 14  Temp: 98.1 F (36.7 C) 98.3 F (36.8 C)  SpO2: 99% 96%     General exam: Appears calm and comfortable  HEENT: Normocephalic, atraumatic Respiratory system: Bibasal decreased breath sound, no increased work of breathing Cardiovascular system: S1 & S2 heard, RRR.  Bilateral leg edema present, chronic looking Gastrointestinal system: Abdomen is nondistended, soft and nontender. Normal bowel sounds heard. Central nervous system: Alert and oriented. No focal neurological deficits. Extremities: Chronic  lower extremities pitting edema+ Skin:  No rashes, lesions or ulcers Vascular access: Left upper extremity AV fistula has thrill and bruit but not mature.    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  She now has features of uremia as well as fluid overload.  The patient is hesitant to talk about dialysis and not willing to try at this time.  I consulted palliative care to address the goals of care.  Meantime, I will order IV Lasix with the help of albumin to augment diuresis.  If she wants to pursue dialysis in the next few days then she will need tunneled HD catheter and vascular consult to address permanent access.  Continue strict ins and out and daily lab monitoring.  #Vascular Access: Status post left first stage basilic vein transposition by Dr. Scot Dock in 03/2020.  It has thrill and bruit however need further intervention.  Pending palliative care discussion about goals of care, she will need vascular evaluation.  #Congestive heart failure with EF of 40 to 45% and grade 1 diastolic dysfunction: Patient with fluid overload.  Diuresis as above.  #Hypokalemia: Replete potassium chloride.  #Secondary hyperparathyroidism/hyperphosphatemia: PTH 150 in 09/2021 which is at goal.  Order sevelamer for hyperphosphatemia.  #Anemia of CKD and iron deficiency: I will order IV iron.  Monitor hemoglobin.  #Left kidney renal mass: Recommend MRI of abdomen without contrast to further evaluate the mass.  May need urology consult.  Thank you for the consult.  We will follow with you.  Aldon Hengst Tanna Furry 11/23/2021, 10:44 AM  [facility].

## 2021-11-23 NOTE — Evaluation (Signed)
Occupational Therapy Evaluation Patient Details Name: [name] [name] MRN: [id number] DOB: July 01, 1951 Today's Date: 11/23/2021   History of Present Illness Pt is a 71 year old woman who came to the ED on 11/21/21 after syncopal after straining on toilet (thought to be vasovagal), abdominal pain, vomiting and constipation. Pulmonary and renal imaging concerning for malignancy. PMH: COPD, CKD5, CHF with EF of 40-45%, OSA, gout, iron deficiency, low back pain, LE venous insufficiency with L LE wound, HTN, TIA, DM2.   Clinical Impression   Pt transferred independently and used a motorized w/c as her means of mobility prior to admission. She was assisted by and aide for ADLs and IADLs. Pt presents with generalized weakness, impaired standing balance, longstanding R shoulder limitations and slow processing. She performed bed mobility and transferred from bed to chair with min assist. She needs set up to total assist for ADLs. Pt is considering going to her sister's home upon discharge. She will not consider SNF.     Recommendations for follow up therapy are one component of a multi-disciplinary discharge planning process, led by the attending physician.  Recommendations may be updated based on patient status, additional functional criteria and insurance authorization.   Follow Up Recommendations  Home health OT (pt is adamantly refusing SNF)    Assistance Recommended at Discharge Frequent or constant Supervision/Assistance  Patient can return home with the following A little help with walking and/or transfers;A lot of help with bathing/dressing/bathroom;Assistance with cooking/housework;Assist for transportation;Help with stairs or ramp for entrance    Functional Status Assessment  Patient has had a recent decline in their functional status and/or demonstrates limited ability to make significant improvements in function in a reasonable and predictable amount of time  Equipment Recommendations  None  recommended by OT    Recommendations for Other Services       Precautions / Restrictions Precautions Precautions: Fall      Mobility Bed Mobility Overal bed mobility: Needs Assistance Bed Mobility: Supine to Sit     Supine to sit: Min assist     General bed mobility comments: assist to raise trunk and for hips to EOB, HOB up    Transfers Overall transfer level: Needs assistance Equipment used: 1 person hand held assist Transfers: Sit to/from Stand, Bed to chair/wheelchair/BSC Sit to Stand: Min assist Stand pivot transfers: Min assist         General transfer comment: pt verbalized where to set up chair to simulated home environment, pt does not use AD when she transfers      Balance Overall balance assessment: Needs assistance   Sitting balance-Leahy Scale: Fair       Standing balance-Leahy Scale: Poor Standing balance comment: requires B UE support of chair                           ADL either performed or assessed with clinical judgement   ADL Overall ADL's : Needs assistance/impaired Eating/Feeding: Independent;Sitting   Grooming: Set up;Sitting;Wash/dry hands   Upper Body Bathing: Minimal assistance;Sitting   Lower Body Bathing: Total assistance;Sit to/from stand   Upper Body Dressing : Minimal assistance;Sitting   Lower Body Dressing: Total assistance;Bed level   Toilet Transfer: Minimal assistance;Stand-pivot Toilet Transfer Details (indicate cue type and reason): simulated bed to chair Toileting- Clothing Manipulation and Hygiene: Total assistance;Sit to/from stand         General ADL Comments: pt moves slowly     Vision Baseline Vision/History: 1 Wears  glasses Ability to See in Adequate Light: 0 Adequate Patient Visual Report: No change from baseline       Perception     Praxis      Pertinent Vitals/Pain Pain Assessment Pain Assessment: Faces Faces Pain Scale: Hurts little more Pain Location: bottoms of feet Pain  Descriptors / Indicators: Sore Pain Intervention(s): Monitored during session     Hand Dominance Right   Extremity/Trunk Assessment Upper Extremity Assessment Upper Extremity Assessment: RUE deficits/detail;Generalized weakness RUE Deficits / Details: longstanding shoulder limitations RUE: Shoulder pain with ROM RUE Coordination: decreased gross motor   Lower Extremity Assessment Lower Extremity Assessment: Defer to PT evaluation       Communication Communication Communication: HOH   Cognition Arousal/Alertness: Awake/alert Behavior During Therapy: Flat affect Overall Cognitive Status: Impaired/Different from baseline Area of Impairment: Problem solving, Following commands                       Following Commands: Follows one step commands with increased time     Problem Solving: Slow processing       General Comments       Exercises     Shoulder Instructions      Home Living Family/patient expects to be discharged to:: Private residence Living Arrangements: Alone Available Help at Discharge: Personal care attendant;Family;Available PRN/intermittently Type of Home: Apartment Home Access: Ramped entrance     Home Layout: One level     Bathroom Shower/Tub: Tub/shower unit;Curtain   Biochemist, clinical: Standard     Home Equipment: Conservation officer, nature (2 wheels);Wheelchair - power;BSC/3in1;Tub bench;Hand held shower head   Additional Comments: Aid available 2 hours a day at least 5 days a week.      Prior Functioning/Environment Prior Level of Function : Needs assist             Mobility Comments: transfers independently, uses power w/c to mobilize ADLs Comments: assisted for ADLs and IADLs by aide        OT Problem List: Decreased strength;Impaired balance (sitting and/or standing);Decreased cognition;Decreased safety awareness      OT Treatment/Interventions: Self-care/ADL training;DME and/or AE instruction;Therapeutic  activities;Patient/family education;Balance training    OT Goals(Current goals can be found in the care plan section) Acute Rehab OT Goals OT Goal Formulation: With patient Time For Goal Achievement: 12/07/21 Potential to Achieve Goals: Fair  OT Frequency: Min 2X/week    Co-evaluation              AM-PAC OT "6 Clicks" Daily Activity     Outcome Measure Help from another person eating meals?: None Help from another person taking care of personal grooming?: A Little Help from another person toileting, which includes using toliet, bedpan, or urinal?: Total Help from another person bathing (including washing, rinsing, drying)?: A Lot Help from another person to put on and taking off regular upper body clothing?: A Little Help from another person to put on and taking off regular lower body clothing?: Total 6 Click Score: 14   End of Session Equipment Utilized During Treatment: Gait belt Nurse Communication: Mobility status  Activity Tolerance: Patient tolerated treatment well Patient left: in chair;with call bell/phone within reach;with chair alarm set  OT Visit Diagnosis: Unsteadiness on feet (R26.81);Other abnormalities of gait and mobility (R26.89);Muscle weakness (generalized) (M62.81)                Time: 9323-5573 OT Time Calculation (min): 40 min Charges:  OT General Charges $OT Visit: 1 Visit OT Evaluation $OT Eval  Moderate Complexity: 1 Mod OT Treatments $Self Care/Home Management : 23-37 mins Nestor Lewandowsky, OTR/L Acute Rehabilitation Services Pager: [phone] Office: [phone]   [name] 11/23/2021, 1:34 PM

## 2021-11-24 ENCOUNTER — Inpatient Hospital Stay (HOSPITAL_COMMUNITY): Payer: Medicare Other

## 2021-11-24 DIAGNOSIS — Z789 Other specified health status: Secondary | ICD-10-CM

## 2021-11-24 DIAGNOSIS — Z515 Encounter for palliative care: Secondary | ICD-10-CM

## 2021-11-24 DIAGNOSIS — N189 Chronic kidney disease, unspecified: Secondary | ICD-10-CM | POA: Diagnosis not present

## 2021-11-24 DIAGNOSIS — D649 Anemia, unspecified: Secondary | ICD-10-CM | POA: Diagnosis not present

## 2021-11-24 DIAGNOSIS — N289 Disorder of kidney and ureter, unspecified: Secondary | ICD-10-CM

## 2021-11-24 DIAGNOSIS — N185 Chronic kidney disease, stage 5: Secondary | ICD-10-CM | POA: Diagnosis not present

## 2021-11-24 DIAGNOSIS — R55 Syncope and collapse: Secondary | ICD-10-CM | POA: Diagnosis not present

## 2021-11-24 LAB — RENAL FUNCTION PANEL
Albumin: 3.1 g/dL — ABNORMAL LOW (ref 3.5–5.0)
Anion gap: 13 (ref 5–15)
BUN: 171 mg/dL — ABNORMAL HIGH (ref 8–23)
CO2: 28 mmol/L (ref 22–32)
Calcium: 9.5 mg/dL (ref 8.9–10.3)
Chloride: 98 mmol/L (ref 98–111)
Creatinine, Ser: 3.03 mg/dL — ABNORMAL HIGH (ref 0.44–1.00)
GFR, Estimated: 16 mL/min — ABNORMAL LOW (ref >60)
Glucose, Bld: 125 mg/dL — ABNORMAL HIGH (ref 70–99)
Phosphorus: 5 mg/dL — ABNORMAL HIGH (ref 2.5–4.6)
Potassium: 3.7 mmol/L (ref 3.5–5.1)
Sodium: 139 mmol/L (ref 135–145)

## 2021-11-24 LAB — GLUCOSE, CAPILLARY
Glucose-Capillary: 134 mg/dL — ABNORMAL HIGH (ref 70–99)
Glucose-Capillary: 149 mg/dL — ABNORMAL HIGH (ref 70–99)
Glucose-Capillary: 170 mg/dL — ABNORMAL HIGH (ref 70–99)
Glucose-Capillary: 181 mg/dL — ABNORMAL HIGH (ref 70–99)
Glucose-Capillary: 203 mg/dL — ABNORMAL HIGH (ref 70–99)
Glucose-Capillary: 224 mg/dL — ABNORMAL HIGH (ref 70–99)

## 2021-11-24 IMAGING — RF DG LUMBAR SPINE 2-3V
1 series · 2 of 2 positions shown · non-contrast
Comparison: None.

CLINICAL DATA: L4-5 fusion.

FLUOROSCOPY TIME:  1 minutes 4 seconds.
Images: Two
EXAM:
LUMBAR SPINE - 2-3 VIEW

[Series 1: run · 2 of 2 slices shown]
[im 1/2]
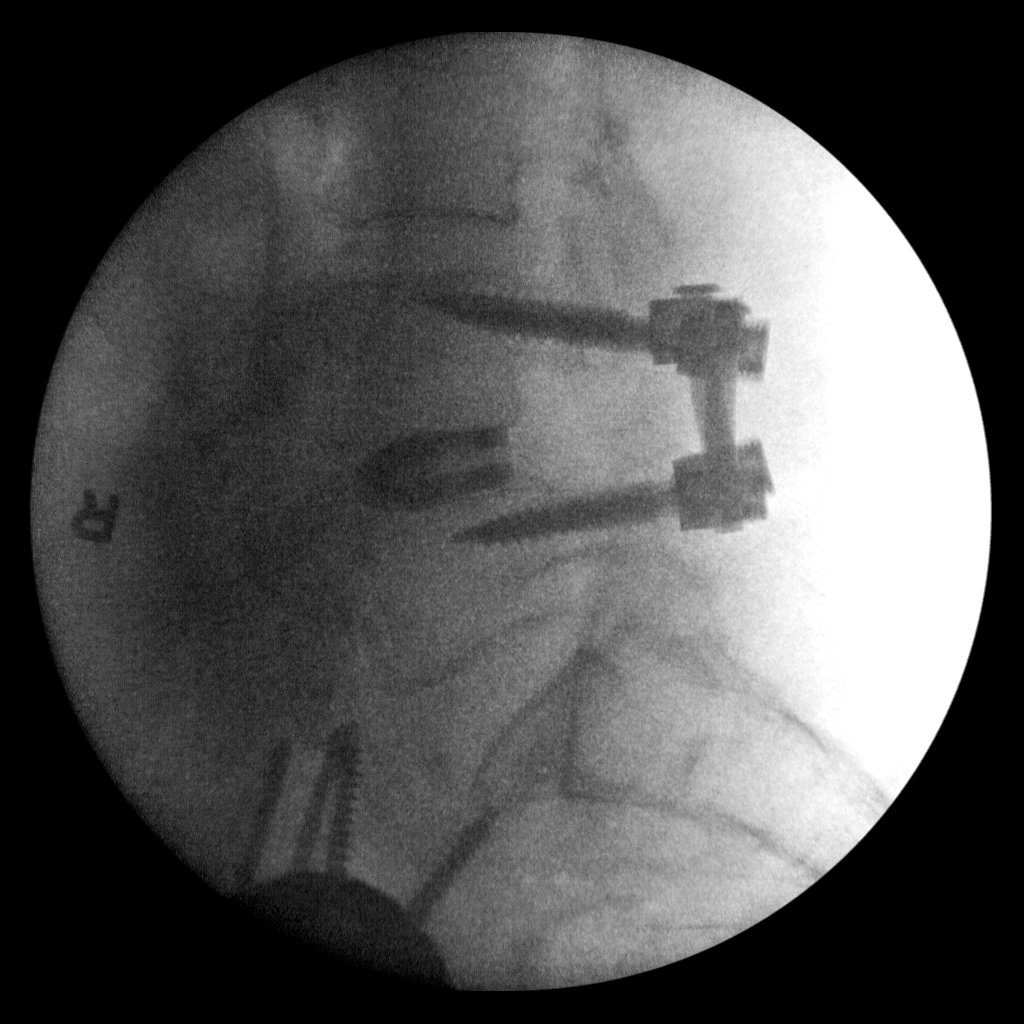
[im 2/2]
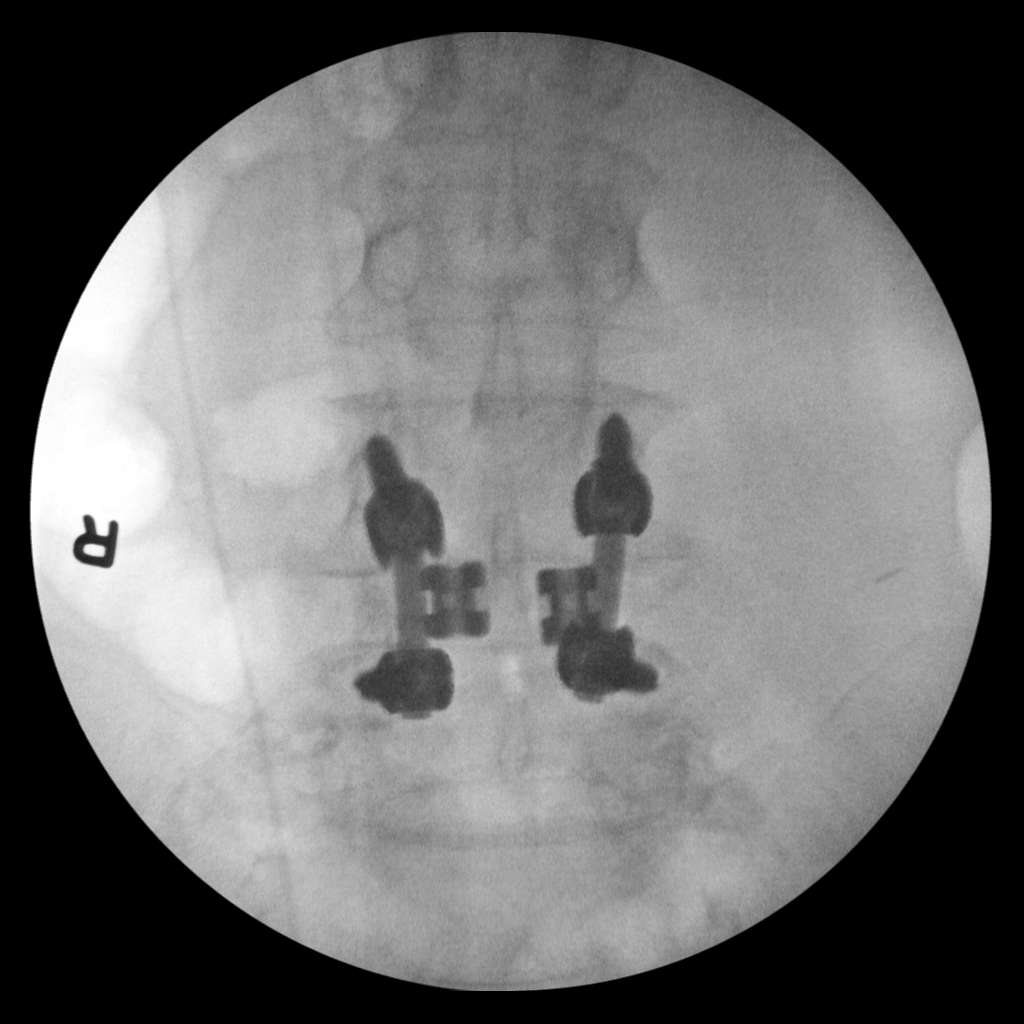

[2 of 2 positions shown; findings below may reference images not displayed]

FINDINGS: There are pedicle rods and screws at L4 and L5. A disc spacer device
is seen at the same levels, in good position.
IMPRESSION: Hardware placement at L4-5 as above.

## 2021-11-24 MED ORDER — ALBUMIN HUMAN 25 % IV SOLN
25.0000 g | Freq: Four times a day (QID) | INTRAVENOUS | Status: AC
  Administered 2021-11-24 (×2): 25 g via INTRAVENOUS
  Filled 2021-11-24 (×2): qty 100

## 2021-11-24 MED ORDER — FUROSEMIDE 10 MG/ML IJ SOLN
80.0000 mg | Freq: Four times a day (QID) | INTRAMUSCULAR | Status: AC
  Administered 2021-11-24 (×2): 80 mg via INTRAVENOUS
  Filled 2021-11-24 (×2): qty 8

## 2021-11-24 NOTE — Progress Notes (Signed)
Nephrology Follow-Up Consult note   Assessment/Recommendations: [name] [name] is a/an 71 y.o. female with a past medical history significant for HTN, obesity, DM 2, COPD, CHF, admitted for syncopal episode complicated by AKI.       Non-Oliguric AKI/azotemia: Creatinine not much higher than baseline which is 2.5-3.5.  However, BUN fairly elevated.  Unclear what caused BUN elevation to be so high out of proportion to creatinine.  Fortunately BUN has improved today down in the 170s.  She does not have significant symptoms of uremia today -Repeat yesterday's recommendations with IV albumin 25 G and IV Lasix 80 mg x 2 doses -Continue discussions regarding dialysis.  Patient has been somewhat hesitant to move forward with dialysis in the past.  Fortunately does not need today -Continue to monitor daily Cr, Dose meds for GFR -Monitor Daily I/Os, Daily weight  -Maintain MAP>65 for optimal renal perfusion.  -Avoid nephrotoxic medications including NSAIDs -Use synthetic opioids (Fentanyl/Dilaudid) if needed -Agree with palliative care involvement  Congestive heart failure: EF around 40 to 46% with diastolic dysfunction.  Lower extremity edema persistent but also may be multifactorial.  Planning for diuresis as above  Vascular access: Status post for stage basilic vein transposition in June 2021.  May need to move forward with more permanent access based on progress and conversations  Hyperphosphatemia: Sevelamer  Anemia of CKD: Iron deficiency.  IV iron ordered.  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  Follow-up 3 to 6 months.  HTN: Coreg 12.5 mg twice daily.  Blood pressure goal   Recommendations conveyed to primary service.    [facility] 11/24/2021 11:57 AM  ___________________________________________________________  CC: Azotemia  Interval History/Subjective: Patient feels well today with no  complaints.  Denies significant shortness of breath   Medications:  Current Facility-Administered Medications  Medication Dose Route Frequency Provider Last Rate Last Admin   acetaminophen (TYLENOL) tablet 650 mg  650 mg Oral Q6H PRN Rick Duff, MD   650 mg at 11/24/21 0205   Or   acetaminophen (TYLENOL) suppository 650 mg  650 mg Rectal Q6H PRN Rick Duff, MD       albumin human 25 % solution 25 g  25 g Intravenous Q6H Reesa Chew, MD       carvedilol (COREG) tablet 12.5 mg  12.5 mg Oral BID Rick Duff, MD   12.5 mg at 11/24/21 0804   ferric gluconate (FERRLECIT) 250 mg in sodium chloride 0.9 % 250 mL IVPB  250 mg Intravenous Daily Rosita Fire, MD 135 mL/hr at 11/24/21 1007 250 mg at 11/24/21 1007   furosemide (LASIX) injection 80 mg  80 mg Intravenous Q6H Reesa Chew, MD       heparin injection 5,000 Units  5,000 Units Subcutaneous Q8H Rick Duff, MD   5,000 Units at 11/24/21 0536   insulin aspart (novoLOG) injection 0-9 Units  0-9 Units Subcutaneous TID WC Rick Duff, MD   1 Units at 11/24/21 0803   insulin glargine-yfgn (SEMGLEE) injection 10 Units  10 Units Subcutaneous Daily Delene Ruffini, MD   10 Units at 11/24/21 1007   ipratropium-albuterol (DUONEB) 0.5-2.5 (3) MG/3ML nebulizer solution 3 mL  3 mL Nebulization Q4H PRN Rick Duff, MD       levothyroxine (SYNTHROID) tablet 200 mcg  200 mcg Oral Q0600 Rick Duff, MD   200 mcg at 11/24/21 0535   metoCLOPramide (REGLAN) tablet 5 mg  5 mg Oral TID Rick Duff, MD  5 mg at 11/24/21 0804   pantoprazole (PROTONIX) EC tablet 40 mg  40 mg Oral QHS Donnamae Jude, RPH   40 mg at 11/23/21 2220   polyethylene glycol (MIRALAX / GLYCOLAX) packet 17 g  17 g Oral Daily PRN Delene Ruffini, MD       sevelamer carbonate (RENVELA) tablet 800 mg  800 mg Oral TID WC Rosita Fire, MD   800 mg at 11/24/21 5093      Review of Systems: 10 systems reviewed and negative  except per interval history/subjective  Physical Exam: Vitals:   11/24/21 0705 11/24/21 0914  BP: (!) 147/74 124/62  Pulse: 71 77  Resp: 16 18  Temp: 97.8 F (36.6 C) 98 F (36.7 C)  SpO2: 98% 95%   Total I/O In: 120 [P.O.:120] Out: 400 [Urine:400]  Intake/Output Summary (Last 24 hours) at 11/24/2021 1157 Last data filed at 11/24/2021 0900 Gross per 24 hour  Intake 608.36 ml  Output 1200 ml  Net -591.64 ml   Constitutional: well-appearing, no acute distress ENMT: ears and nose without scars or lesions, MMM CV: normal rate, 2+ pitting edema in the bilateral lower extremities Respiratory: Bilateral chest rise, normal work of breathing Gastrointestinal: soft, non-tender, no palpable masses or hernias Skin: no visible lesions or rashes Psych: alert, judgement/insight appropriate, appropriate mood and affect   Test Results I personally reviewed new and old clinical labs and radiology tests Lab Results  Component Value Date   NA 139 11/24/2021   K 3.7 11/24/2021   CL 98 11/24/2021   CO2 28 11/24/2021   BUN 171 (H) 11/24/2021   CREATININE 3.03 (H) 11/24/2021   CALCIUM 9.5 11/24/2021   ALBUMIN 3.1 (L) 11/24/2021   PHOS 5.0 (H) 11/24/2021    CBC Recent Labs  Lab 11/21/21 1413 11/22/21 0259 11/23/21 0130  WBC 11.0* 18.7* 9.9  NEUTROABS 9.3*  --   --   HGB 7.9* 8.4* 7.4*  HCT 26.6* 27.6* 24.1*  MCV 89.9 88.5 87.3  PLT 265 282 260

## 2021-11-24 NOTE — Progress Notes (Signed)
Physical Therapy Treatment Patient Details Name: [name] [name] MRN: [id number] DOB: [birth year] Today's Date: 11/24/2021   History of Present Illness Pt is a 71 year old woman who came to the ED on 11/21/21 after syncopal after straining on toilet (thought to be vasovagal), abdominal pain, vomiting and constipation. Pulmonary and renal imaging concerning for malignancy. PMH: COPD, CKD5, CHF with EF of 40-45%, OSA, gout, iron deficiency, low back pain, LE venous insufficiency with L LE wound, HTN, TIA, DM2. (Simultaneous filing. User may not have seen previous data.)    PT Comments    Pt received on [facility] after working with OT, agreeable to work with PTA for further mobility. Pt needing min guard for sit>stand and stand pivot transfer from [facility] to EOB, which appears consistent with her baseline. She does need minA for sit>supine transfer for BLE assist and emphasis on improved posture in bed/chair, BLE seated/supine exercises for strengthening and pressure relief strategies to prevent skin breakdown. Pt with RLE edema/tenderness to palpation. Pt continues to benefit from PT services to progress toward functional mobility goals. Disposition updated below as pt closer to her functional baseline and pt/family wanting her to return home but interested in obtaining more services for her at home, such as [facility]. Family interested in speaking further about this with case mgmt.  Recommendations for follow up therapy are one component of a multi-disciplinary discharge planning process, led by the attending physician.  Recommendations may be updated based on patient status, additional functional criteria and insurance authorization.  Follow Up Recommendations  Home health PT (pt asking about more home and community services such as [facility], case mgr notified)     Assistance Recommended at Discharge Frequent or constant Supervision/Assistance  Patient can return home with the following Assistance with  cooking/housework;Assist for transportation;Help with stairs or ramp for entrance;A lot of help with walking and/or transfers;A lot of help with bathing/dressing/bathroom   Equipment Recommendations  None recommended by PT    Recommendations for Other Services       Precautions / Restrictions Precautions Precautions: Fall (Simultaneous filing. User may not have seen previous data.) Precaution Comments: partial stand pivots at baseline, does better without RW Restrictions Weight Bearing Restrictions: No     Mobility  Bed Mobility     Rolling: Min guard       Sit to sidelying: Min assist      Transfers   Equipment used: None                    Ambulation/Gait               General Gait Details: did not walk prior to admission   Stairs             Wheelchair Mobility    Modified Rankin (Stroke Patients Only)       Balance   Sitting-balance support: No upper extremity supported                                        Cognition       Area of Impairment: Following commands, Safety/judgement, Problem solving                       Following Commands: Follows one step commands with increased time, Follows one step commands consistently   Awareness: Emergent  Exercises Other Exercises Other Exercises: supine BLE AROM: ankle pumps, quad sets, glute sets x5 reps Other Exercises: seated BLE AROM: LAQ, hip flexion x10 reps ea    General Comments General comments (skin integrity, edema, etc.): increased time to initiate, pt reports abdominal "pressure" and feeling of constipation      Pertinent Vitals/Pain      Home Living                          Prior Function            PT Goals (current goals can now be found in the care plan section) Acute Rehab PT Goals Patient Stated Goal: to go home with her sister vs spouse PT Goal Formulation: With patient Time For Goal Achievement:  12/07/21 Progress towards PT goals: Progressing toward goals    Frequency    Min 3X/week      PT Plan Current plan remains appropriate    Co-evaluation              AM-PAC PT "6 Clicks" Mobility   Outcome Measure  Help needed turning from your back to your side while in a flat bed without using bedrails?: A Little Help needed moving from lying on your back to sitting on the side of a flat bed without using bedrails?: A Little Help needed moving to and from a bed to a chair (including a wheelchair)?: A Little Help needed standing up from a chair using your arms (e.g., wheelchair or bedside chair)?: A Little Help needed to walk in hospital room?: Total Help needed climbing 3-5 steps with a railing? : Total 6 Click Score: 14    End of Session Equipment Utilized During Treatment: Gait belt Activity Tolerance: Patient tolerated treatment well Patient left: with call bell/phone within reach;in bed;with bed alarm set Nurse Communication: Mobility status;Other (comment) (pt c/o constipation, asking about getting her leg rewrapped) PT Visit Diagnosis: Unsteadiness on feet (R26.81);Muscle weakness (generalized) (M62.81);Difficulty in walking, not elsewhere classified (R26.2);Pain Pain - Right/Left:  (both) Pain - part of body: Ankle and joints of foot     Time: 3536-1443 PT Time Calculation (min) (ACUTE ONLY): 13 min  Charges:  $Therapeutic Activity: 8-22 mins                     [name] Dewan P., PTA Acute Rehabilitation Services Pager: [phone] Office: [place] 11/24/2021, 4:37 PM

## 2021-11-24 NOTE — Progress Notes (Signed)
Occupational Therapy Treatment Patient Details Name: [name] [name] MRN: [id number] DOB: 13-May-1951 Today's Date: 11/24/2021   History of present illness Pt is a 71 year old woman who came to the ED on 11/21/21 after syncopal after straining on toilet (thought to be vasovagal), abdominal pain, vomiting and constipation. Pulmonary and renal imaging concerning for malignancy. PMH: COPD, CKD5, CHF with EF of 40-45%, OSA, gout, iron deficiency, low back pain, LE venous insufficiency with L LE wound, HTN, TIA, DM2.   OT comments  Husband on phone is states pt needs more help in the home. He plans to apply for [employer] to extend her hours. Expressed to husband that the waiting list may be extensive and told home about [facility]. Husband plans to investigate this as an option, pt and family remain adamant she will not go to a SNF. Focused today on transfers. Pt requiring up to min assist, but must direct the set up of furniture to successfully transfer.   Recommendations for follow up therapy are one component of a multi-disciplinary discharge planning process, led by the attending physician.  Recommendations may be updated based on patient status, additional functional criteria and insurance authorization.    Follow Up Recommendations  Home health OT    Assistance Recommended at Discharge Frequent or constant Supervision/Assistance  Patient can return home with the following  A little help with walking and/or transfers;A lot of help with bathing/dressing/bathroom;Assistance with cooking/housework;Assist for transportation;Help with stairs or ramp for entrance   Equipment Recommendations  None recommended by OT    Recommendations for Other Services      Precautions / Restrictions Precautions Precautions: Fall Precaution Comments: partial stand pivots at baseline, does better without RW Restrictions Weight Bearing Restrictions: No       Mobility Bed Mobility Overal bed  mobility: Needs Assistance Bed Mobility: Sit to Supine       Sit to supine: Mod assist   General bed mobility comments: assist for LEs back into bed    Transfers Overall transfer level: Needs assistance Equipment used: None Transfers: Sit to/from Stand, Bed to chair/wheelchair/BSC Sit to Stand: Min assist Stand pivot transfers: Min assist         General transfer comment: assist to rise and steady, increased time,  pt able to take pivotal steps with B UE support of furniture, pt does not use RW to transfer at home     Balance Overall balance assessment: Needs assistance   Sitting balance-Leahy Scale: Fair     Standing balance support: Bilateral upper extremity supported, During functional activity Standing balance-Leahy Scale: Poor Standing balance comment: reliant on B UE support                           ADL either performed or assessed with clinical judgement   ADL Overall ADL's : Needs assistance/impaired     Grooming: Wash/dry hands;Wash/dry face;Sitting;Set up                   Toilet Transfer: Minimal assistance;Stand-pivot;BSC/3in1 Toilet Transfer Details (indicate cue type and reason): chair to [place] Manipulation and Hygiene: Total assistance;Sit to/from stand         General ADL Comments: pt moves slowly    Extremity/Trunk Assessment              Vision       Perception     Praxis      Cognition Arousal/Alertness:  Awake/alert Behavior During Therapy: WFL for tasks assessed/performed Overall Cognitive Status: Impaired/Different from baseline                               Problem Solving: Slow processing General Comments: pt responds well when directing care and set up of furniture for safe transfer        Exercises      Shoulder Instructions       General Comments increased time to initiate, pt reports abdominal "pressure" and feeling of constipation    Pertinent Vitals/  Pain       Pain Assessment Pain Assessment: Faces Faces Pain Scale: Hurts even more Pain Location: R LE with touch Pain Descriptors / Indicators: Moaning, Grimacing, Guarding Pain Intervention(s): Repositioned  Home Living                                          Prior Functioning/Environment              Frequency  Min 2X/week        Progress Toward Goals  OT Goals(current goals can now be found in the care plan section)  Progress towards OT goals: Progressing toward goals  Acute Rehab OT Goals OT Goal Formulation: With patient Time For Goal Achievement: 12/07/21 Potential to Achieve Goals: [place] Discharge plan remains appropriate    Co-evaluation                 AM-PAC OT "6 Clicks" Daily Activity     Outcome Measure   Help from another person eating meals?: None Help from another person taking care of personal grooming?: A Little Help from another person toileting, which includes using toliet, bedpan, or urinal?: Total Help from another person bathing (including washing, rinsing, drying)?: A Lot Help from another person to put on and taking off regular upper body clothing?: A Little Help from another person to put on and taking off regular lower body clothing?: Total 6 Click Score: 14    End of Session    OT Visit Diagnosis: Unsteadiness on feet (R26.81);Other abnormalities of gait and mobility (R26.89);Muscle weakness (generalized) (M62.81)   Activity Tolerance Patient tolerated treatment well   Patient Left in bed;with call bell/phone within reach;with bed alarm set   Nurse Communication Other (comment) (pt wants dressing changed and laxative)        Time: 2229-7989 OT Time Calculation (min): 17 min  Charges: OT General Charges $OT Visit: 1 Visit OT Treatments $Self Care/Home Management : 8-22 mins Nestor Lewandowsky, OTR/L [place] Pager: [phone] Office: [phone]   [name] 11/24/2021, 4:58 PM

## 2021-11-24 NOTE — Progress Notes (Signed)
HD#1 SUBJECTIVE:  Patient Summary: [name] [name] is a 71 y.o. with a pertinent PMH of DM2 and CKD stage 5, who presented with LOC and admitted for syncope.   Overnight Events: no acute events overnight    Interm History: Patient seen and evaluated at bedside. Reports she is feeling better after receiving lasix yesterday. Discussed again with her that these treatments are not permanent solutions and that her kidney disease will eventually progress to the point of needing dialysis. She emphasized again that she did not want dialysis at this time and was tired of discussing it.    OBJECTIVE:  Vital Signs: Vitals:   11/23/21 2157 11/24/21 0520 11/24/21 0705 11/24/21 0914  BP: (!) 148/74 (!) 143/71 (!) 147/74 124/62  Pulse: 79 80 71 77  Resp: 17 18 16 18   Temp: 97.6 F (36.4 C) 98.6 F (37 C) 97.8 F (36.6 C) 98 F (36.7 C)  TempSrc: Oral Oral Oral Oral  SpO2: 97% 97% 98% 95%  Weight:      Height:       Constitutional: Well-developed, well-nourished, alert Neck: Normal range of motion.  Cardiovascular: Normal rate, regular rhythm, intact distal pulses. No gallop and no friction rub. No murmur heard.  Bilateral lower extremity edema right greater than left.  2+ to the knee right side, left 1+ Pulmonary: Non labored breathing on room air, crackles bilateral lower lobes Abdominal: Soft.  Hyperactive bowel sounds in right lower quadrant. Non distended. Improved tenderness, nonperitonitic Musculoskeletal: Normal range of motion. Tender lower extremities, right more edematous than left, nonpitting Neurological: more confused today Skin: Left lower extremities with erythema and warmth, wound left shin.    Pertinent Labs: CBC Latest Ref Rng & Units 11/23/2021 11/22/2021 11/21/2021  WBC 4.0 - 10.5 K/uL 9.9 18.7(H) 11.0(H)  Hemoglobin 12.0 - 15.0 g/dL 7.4(L) 8.4(L) 7.9(L)  Hematocrit 36.0 - 46.0 % 24.1(L) 27.6(L) 26.6(L)  Platelets 150 - 400 K/uL 260 282 265    CMP Latest Ref Rng  & Units 11/24/2021 11/23/2021 11/22/2021  Glucose 70 - 99 mg/dL 125(H) 199(H) 100(H)  BUN 8 - 23 mg/dL 171(H) 189(H) 186(H)  Creatinine 0.44 - 1.00 mg/dL 3.03(H) 3.42(H) 3.03(H)  Sodium 135 - 145 mmol/L 139 137 141  Potassium 3.5 - 5.1 mmol/L 3.7 3.4(L) 3.3(L)  Chloride 98 - 111 mmol/L 98 96(L) 98  CO2 22 - 32 mmol/L 28 27 27   Calcium 8.9 - 10.3 mg/dL 9.5 9.2 9.8  Total Protein 6.5 - 8.1 g/dL - 5.5(L) -  Total Bilirubin 0.3 - 1.2 mg/dL - 0.3 -  Alkaline Phos 38 - 126 U/L - 50 -  AST 15 - 41 U/L - 18 -  ALT 0 - 44 U/L - 12 -     ASSESSMENT/PLAN:   Assessment:  #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 Patient with persistently elevated serum creatinine as well as BUN. She is still able to produce urine.  CKD is managed by nephrology outpatient and they are currently recommending dialysis however she has declined HD in the past. She has had left first stage basilic vein transposition performed 03/2020, but did not follow through with dialysis. She states that her reason for not wanting to pursue HD is that she is afraid of what it means. Her sister, [name] [name], is a source of support for her, however, attempts to contact her thus far have been unsuccessful.  She has symptomatic uremia contributing to syncope and declining functional status at home. Patient had expressed  willingness to discuss HD with nephrology yesterday, however, upon their meeting with the patient, she again refused HD. Have had several discussion with patient at this point that her renal disease is driving many of her current symptoms and complaints, and that without dialysis, we are not able to fix her underlying disease. Palliative care has been consulted to assist with determining her desires and goals of care. Will need to have ongoing goals of care discussions with patient to determine her desires. She emphasized to the team today that she strongly does not want dialysis at this point, but is willing to have it in  emergent situations. Although, dialysis would be definitive treatment, we will continue to manage her symptoms and problems as they arise.  - Patient has been receiving lasix 80mg  IV with IV albumin 25G for diuresis with improvement in her BUN, Cr, and volume overload. This will be continued today. Her I+O's and renal function will need to be monitored. - Avoid nephrotoxic drugs and dose medications per GFR. She has neuropathic pain in her legs and can be given small doses 100mg  Gabapentin as needed for a maximum of 300mg  daily. For more severe pain, dilaudid can be given.  - she is receiving Sevelamer for her hyperphosphatemia   #Syncope Likely multifactorial etiologies including vasovagal from pain, situational from defecation, uremia and anemia. Anticipate this will improve if she accepts HD this admission.   #Constipation Generalized abdominal pain. Imaging revealed large stool burden. - miralax and senna daily   #Chronic HFmrEF Patient's echo 02/2021.  EF noted to be 45 to 50% at that time.  There was no wall motion abnormalities.  She was noted to have left ventricular hypertrophy, no valvulopathy, left atrium was noted to be severely dilated.  Patient is on torsemide milligrams twice daily per EMR as well as metolazone 3 times a week.   - ECHO shows mild worsening of EF to 40-45% in setting of worsening uremia - She has been receiving IV diuresis with some improvement in her LEE, however, she will need to continue diuresis for now.    #T2DM Patient's last A1c was 02/2021 and noted to be 6.8.  Per EMR she takes 75/25 insulin 40 units in the a.m. and 30 units in the evening as well as Trulicity 1.5 mg weekly.  Patient is also on Reglan with 3 times daily dosing. -We will start patient on sliding scale insulin and continue her home Reglan.  Will uptitrate as necessary.  #Renal mass  Patient noted to have interval enlargement of L kidney exophytic density (now 3.2 cm) compared to 02/2021  imaging - patient underwent MRI imaging of this mass. 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    #LLL nodules Patient with left lower lobe nodules noted on previous imaging.  However, on CT obtained this hospitalization there noted to be larger, with the largest measuring 2.4 cm.  At risk for malignancy or metastatic disease and an outpatient PET scan is recommended for further evaluation. Will plan to arrange for this after discharge.   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 Patient was noted to be significantly iron deficient on labs 09/2021 with a ferritin of 11 and a transferrin saturation of 7%.  She did receive IV iron  in the past for this most recently, Feraheme 10/04/2021. - she has been started on IV iron. Will plan to follow daily CBC  Signature: Delene Ruffini, MD  Internal Medicine Resident, PGY-1 [facility]  Pager: [phone] 11:14 AM, 11/24/2021   Please contact the on call pager after 5 pm and on weekends at [phone].

## 2021-11-24 NOTE — TOC Progression Note (Addendum)
Transition of Care [facility]) - Progression Note    Patient Details  Name: [name] [name] MRN: [id number] Date of Birth: Dec 31, 1950  Transition of Care [facility]) CM/SW Contact  Emeterio Reeve, [place] Phone Number: 11/24/2021, 2:09 PM  Clinical Narrative:     CSW received SNF consult. CSW met with pt at bedside. CSW introduced self and explained role at the hospital. Pt reports that PTA she lives at home alone. Pt has a aide that comes daily for personal care. Pt reports she was independent at home. PT note states pt uses a wheelchair at home and was minimally mobile.   CSW reviewed PT/OT recommendations for SNF. Pt reports she does not want to go to SNF due to a bad experience at her last SNF. CSW  explained that we can see if a different SNF makes an offer. Pt has been to [facility] and [facility] in the past. Pt gave CSW permission to fax out to facilities in the area. Pt has no preference of facility at this time. CSW gave pt medicare.gov rating list to review. CSW explained insurance auth process.   CSW will continue to follow.   Expected Discharge Plan: [place] Barriers to Discharge: Continued Medical Work up  Expected Discharge Plan and Services Expected Discharge Plan: [place]   Discharge Planning Services: CM Consult Post Acute Care Choice: [place] arrangements for the past 2 months: Apartment                   DME Agency: NA       HH Arranged: RN           Social Determinants of Health (SDOH) Interventions    Readmission Risk Interventions Readmission Risk Prevention Plan 01/14/2020  Transportation Screening Complete  Medication Review Press photographer) Complete  PCP or Specialist appointment within 3-5 days of discharge Complete  HRI or [place] Complete  SW Recovery Care/Counseling Consult Complete  Palliative Care Screening Not [place] Complete  Some recent data might be hidden    Emeterio Reeve, LCSW Clinical Social Worker

## 2021-11-24 NOTE — Consult Note (Signed)
Consultation Note Date: 11/24/2021   Patient Name: [name] [name]  DOB: 11/17/1950  MRN: [id number]  Age / Sex: 71 y.o., female  PCP: [name], MD Referring Physician: Velna Ochs, MD  Reason for Consultation: Establishing goals of care  HPI/Patient Profile: 71 y.o. female  with past medical history of COPD, CKD stage V, diastolic CHF (EF 40 to 56%), OSA, gout, iron deficient anemia, low back pain, lower extremity venous insufficiency with left lower extremity wound, HTN, TIA, and type 2 diabetes admitted on 11/21/2021 with syncope after straining on the toilet with abdominal pain, vomiting, and constipation.  Etiology of syncopal episode remains unclear.    CT of abdomen reveals exophytic soft tissue on the left kidney.  CT of the chest reveals left lower lobe nodule (2.4 cm).  Imaging suspect for malignancy.   Clinical Assessment and Goals of Care: I have reviewed medical records including EPIC notes, labs and imaging, assessed the patient and then met with patient at bedside to discuss diagnosis prognosis, GOC, EOL wishes, disposition and options.  I introduced Palliative Medicine as specialized medical care for people living with serious illness. It focuses on providing relief from the symptoms and stress of a serious illness. The goal is to improve quality of life for both the patient and the family.  We discussed a brief life review of the patient.  She speaks fondly of her husband of "46 to 65 years".  She shares she worked to the night shift at [employer], an Passenger transport manager, and various other jobs throughout the course of her working life.  She has 1 son whom she shares is unmarried because he is so mean.  She states she lives with her son but is ready to kick him out because he is not nice to her.  She denies physical abuse but shares he cusses at her and does not help her when asked.  As far as  functional and nutritional status pt states she is able to get around her house with help of an aide. From chart review I note that patient uses a wheelchair at baseline but pt states she uses it only to get places.   We discussed patient's current illness and what it means in the larger context of patient's on-going co-morbidities.  I outlined results of CT scans regarding tissue found on kidney and LLL nodule. Pt shares she cannot remember if anyone told her about there or not. She states her sister has been asking her about the thing in her lung but that she did not know what she was talking about. Pt denies memory issues and is oriented X3 during our discussion. However, she appears to not remember being told about her CT results.   I attempted to elicit values and goals of care important to the patient.  She states she would like to be at home, be able to get around to the bathroom and kitchen when she wants to, and to be out of pain. She also states she is "tired of  all of this" and is "ready to go". However, she wants to investigate and find out "what it is on my kidneys and in my lungs". Reviewed outpatient f/u options including PET.   When I asked what she meant by ready to go , she recalls how her mother went to sleep one night and never woke up. She didn't suffer through "all of this". Pt shares she would like to just go on if the Reita Cliche thinks it is her time because she is "done with all of this stuff". We discussed human mortality, suffering, lingering in pain, and how living with chronic illness(es) takes a toll not only physically but also emotionally. Therapeutic silence and active listening offered to patient.   The difference between aggressive medical intervention and comfort care was considered in light of the patient's goals of care. Hospice and Palliative Care services outpatient were explained and offered.  Discussed with patient/family the importance of continued conversation with  family and the medical providers regarding overall plan of care and treatment options, ensuring decisions are within the context of the patients values and GOCs.    Questions and concerns were addressed. The family was encouraged to call with questions or concerns.   Primary Decision Maker PATIENT  Code Status/Advance Care Planning: Full code  Prognosis:   Unable to determine  Discharge Planning: To Be Determined  Primary Diagnoses: Present on Admission:  Syncope  CKD (chronic kidney disease) stage 5, GFR less than 15 ml/min (HCC)   Physical Exam Vitals and nursing note reviewed.  Constitutional:      General: She is not in acute distress.    Appearance: She is well-developed. She is not ill-appearing.  HENT:     Head: Normocephalic.     Mouth/Throat:     Mouth: Mucous membranes are moist.  Cardiovascular:     Rate and Rhythm: Normal rate.  Pulmonary:     Effort: Pulmonary effort is normal.  Skin:    General: Skin is warm and dry.     Comments: Bilateral LE edema  Neurological:     Mental Status: She is alert and oriented to person, place, and time.  Psychiatric:        Mood and Affect: Mood normal. Mood is not anxious or depressed.     Comments: Appropriately tearful    Palliative Assessment/Data: 50%     I discussed this patient's plan of care with patient, RN Joelene Millin.  Thank you for this consult. Palliative medicine will continue to follow and assist holistically.   Time Total: 120 minutes Greater than 50%  of this time was spent counseling and coordinating care related to the above assessment and plan.  Signed by: Jordan Hawks, DNP, FNP-BC Palliative Medicine    Please contact Palliative Medicine Team phone at [phone] for questions and concerns.  For individual provider: See Shea Evans

## 2021-11-24 NOTE — NC FL2 (Signed)
[place] LEVEL OF CARE SCREENING TOOL     IDENTIFICATION  Patient Name: [name] [name] Birthdate: Dec 19, 1950 Sex: female Admission Date (Current Location): 11/21/2021  [facility] and [place] Number:  Herbalist and Address:  [facility], [place] [address] [place], [place] [address]      Provider Number: [id number]  Attending Physician Name and Address:  Velna Ochs, MD  Relative Name and Phone Number:       Current Level of Care: Hospital Recommended Level of Care: [place] Prior Approval Number:    Date Approved/Denied:   PASRR Number: [id number]  Discharge Plan: SNF    Current Diagnoses: Patient Active Problem List   Diagnosis Date Noted   Kidney lesion, native, left 11/24/2021   CKD (chronic kidney disease) stage 5, GFR less than 15 ml/min (HCC) 11/23/2021   Syncope 11/21/2021   Chronic diastolic (congestive) heart failure ([place]) 07/01/2021   Chronic pain syndrome 07/01/2021   COPD (chronic obstructive pulmonary disease) ([place]) 07/01/2021   Cellulitis of leg, right 07/01/2021   SARS-CoV-2 positive 02/21/2021   Increased weakness when ambulating 02/17/2021   Intractable nausea and vomiting 02/15/2021   Symptomatic anemia 01/24/2021   Anemia, unspecified 01/08/2020   Bacteremia 01/07/2020   UTI (urinary tract infection) 01/06/2020   Anemia 10/26/2019   S/P lumbar fusion 09/18/2019   Chronic CHF (congestive heart failure) ([place]) 03/16/2019   Leukocytosis 03/16/2019   Chest pain 11/27/2018   Pre-syncope 11/27/2018   Epigastric abdominal pain 11/27/2018   Solitary pulmonary nodule 11/06/2018   Hypertensive heart and renal disease, stage 1-4 or unspecified chronic kidney disease, with heart failure ([place]) 10/20/2018   CHF (congestive heart failure), NYHA class III, acute on chronic, systolic ([place]) 60/07/9322   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, unspecified extremity  ([place]) 04/19/2016   Cellulitis of abdominal wall 03/21/2015   Acute on chronic renal failure ([place]) 03/21/2015   DM (diabetes mellitus), type 2 with renal complications ([place]) 55/73/2202   Abdominal wall abscess 03/21/2015   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   Low urine output 08/11/2014   Acute on chronic systolic CHF (congestive heart failure), NYHA class 3 ([place]) 08/11/2014   Asthma, chronic 08/11/2014   Hypothyroidism 08/11/2014   OSA (obstructive sleep apnea) 08/11/2014   Positive cardiac stress test 06/29/2014   Hyperlipidemia 02/12/2014   Left arm weakness 12/23/2013   TIA (transient ischemic attack) 12/23/2013   Left buttock abscess 10/12/2013   Cellulitis and abscess of leg, right 04/16/2013   Acute blood loss anemia 02/14/2013   HTN (hypertension) 02/14/2013   Cellulitis and abscess of buttock 02/09/2013   Class 2 obesity due to excess calories with body mass index (BMI) of 36.0 to 36.9 in adult 02/03/2013   Diarrhea 02/03/2013   Hyponatremia 02/03/2013   Sepsis due to undetermined organism ([place]) 02/02/2013   Diabetes mellitus without complication ([place]) 54/27/0623   CKD (chronic kidney disease), stage IV ([place]) 02/02/2013   Cellulitis 02/02/2013   PAD (peripheral artery disease) ([place]) 04/26/2011   Tobacco abuse 04/26/2011    Orientation RESPIRATION BLADDER Height & Weight     Self, Time, Situation, Place  Normal Continent, External catheter Weight: 205 lb 11.2 oz (93.3 kg) Height:  5\' 3"  (160 cm)  BEHAVIORAL SYMPTOMS/MOOD NEUROLOGICAL BOWEL NUTRITION STATUS      Continent Diet (See DC summary)  AMBULATORY STATUS COMMUNICATION OF NEEDS Skin   Extensive Assist Verbally Other (Comment) (Left leg wound)  Personal Care Assistance Level of Assistance  Bathing, Feeding, Dressing Bathing Assistance: Maximum assistance Feeding assistance: Independent Dressing Assistance: Maximum assistance     Functional Limitations Info  Sight,  Hearing, Speech Sight Info: Adequate Hearing Info: Adequate Speech Info: Adequate    SPECIAL CARE FACTORS FREQUENCY  PT (By licensed PT), OT (By licensed OT)     PT Frequency: 5x a week OT Frequency: 5x a week            Contractures Contractures Info: Not present    Additional Factors Info  Code Status, Allergies Code Status Info: Full Allergies Info: Nebivolol   Ace Inhibitors   Morphine And Related           Current Medications (11/24/2021):  This is the current hospital active medication list Current Facility-Administered Medications  Medication Dose Route Frequency Provider Last Rate Last Admin   acetaminophen (TYLENOL) tablet 650 mg  650 mg Oral Q6H PRN Rick Duff, MD   650 mg at 11/24/21 0205   Or   acetaminophen (TYLENOL) suppository 650 mg  650 mg Rectal Q6H PRN Rick Duff, MD       albumin human 25 % solution 25 g  25 g Intravenous Q6H Reesa Chew, MD 60 mL/hr at 11/24/21 1420 25 g at 11/24/21 1420   carvedilol (COREG) tablet 12.5 mg  12.5 mg Oral BID Rick Duff, MD   12.5 mg at 11/24/21 0804   furosemide (LASIX) injection 80 mg  80 mg Intravenous Q6H Reesa Chew, MD   80 mg at 11/24/21 1319   heparin injection 5,000 Units  5,000 Units Subcutaneous Q8H Rick Duff, MD   5,000 Units at 11/24/21 1319   insulin aspart (novoLOG) injection 0-9 Units  0-9 Units Subcutaneous TID WC Rick Duff, MD   2 Units at 11/24/21 1319   insulin glargine-yfgn (SEMGLEE) injection 10 Units  10 Units Subcutaneous Daily Delene Ruffini, MD   10 Units at 11/24/21 1007   ipratropium-albuterol (DUONEB) 0.5-2.5 (3) MG/3ML nebulizer solution 3 mL  3 mL Nebulization Q4H PRN Rick Duff, MD       levothyroxine (SYNTHROID) tablet 200 mcg  200 mcg Oral Q0600 Rick Duff, MD   200 mcg at 11/24/21 0535   metoCLOPramide (REGLAN) tablet 5 mg  5 mg Oral TID Rick Duff, MD   5 mg at 11/24/21 0804   pantoprazole (PROTONIX) EC tablet 40 mg   40 mg Oral QHS Donnamae Jude, RPH   40 mg at 11/23/21 2220   polyethylene glycol (MIRALAX / GLYCOLAX) packet 17 g  17 g Oral Daily PRN Delene Ruffini, MD       sevelamer carbonate (RENVELA) tablet 800 mg  800 mg Oral TID WC Rosita Fire, MD   800 mg at 11/24/21 1319     Discharge Medications: Please see discharge summary for a list of discharge medications.  Relevant Imaging Results:  Relevant Lab Results:   Additional Information SSN [id number],  Patient reports being COVID vaccinated x2  Emeterio Reeve, LCSW

## 2021-11-25 LAB — CBC
HCT: 23.6 % — ABNORMAL LOW (ref 36.0–46.0)
Hemoglobin: 7.1 g/dL — ABNORMAL LOW (ref 12.0–15.0)
MCH: 26.3 pg (ref 26.0–34.0)
MCHC: 30.1 g/dL (ref 30.0–36.0)
MCV: 87.4 fL (ref 80.0–100.0)
Platelets: 252 10*3/uL (ref 150–400)
RBC: 2.7 MIL/uL — ABNORMAL LOW (ref 3.87–5.11)
RDW: 22.5 % — ABNORMAL HIGH (ref 11.5–15.5)
WBC: 8.7 10*3/uL (ref 4.0–10.5)
nRBC: 0 % (ref 0.0–0.2)

## 2021-11-25 LAB — COMPREHENSIVE METABOLIC PANEL
ALT: 9 U/L (ref 0–44)
AST: 18 U/L (ref 15–41)
Albumin: 3.6 g/dL (ref 3.5–5.0)
Alkaline Phosphatase: 45 U/L (ref 38–126)
Anion gap: 13 (ref 5–15)
BUN: 169 mg/dL — ABNORMAL HIGH (ref 8–23)
CO2: 30 mmol/L (ref 22–32)
Calcium: 10 mg/dL (ref 8.9–10.3)
Chloride: 98 mmol/L (ref 98–111)
Creatinine, Ser: 2.98 mg/dL — ABNORMAL HIGH (ref 0.44–1.00)
GFR, Estimated: 16 mL/min — ABNORMAL LOW (ref >60)
Glucose, Bld: 154 mg/dL — ABNORMAL HIGH (ref 70–99)
Potassium: 4 mmol/L (ref 3.5–5.1)
Sodium: 141 mmol/L (ref 135–145)
Total Bilirubin: 0.2 mg/dL — ABNORMAL LOW (ref 0.3–1.2)
Total Protein: 6.3 g/dL — ABNORMAL LOW (ref 6.5–8.1)

## 2021-11-25 LAB — PHOSPHORUS: Phosphorus: 4.3 mg/dL (ref 2.5–4.6)

## 2021-11-25 LAB — GLUCOSE, CAPILLARY
Glucose-Capillary: 129 mg/dL — ABNORMAL HIGH (ref 70–99)
Glucose-Capillary: 151 mg/dL — ABNORMAL HIGH (ref 70–99)
Glucose-Capillary: 178 mg/dL — ABNORMAL HIGH (ref 70–99)
Glucose-Capillary: 161 mg/dL — ABNORMAL HIGH (ref 70–99)

## 2021-11-25 MED ORDER — MILK AND MOLASSES ENEMA
1.0000 | Freq: Once | RECTAL | Status: AC
  Administered 2021-11-25: 240 mL via RECTAL
  Filled 2021-11-25: qty 240

## 2021-11-25 MED ORDER — GABAPENTIN 100 MG PO CAPS
100.0000 mg | ORAL_CAPSULE | Freq: Once | ORAL | Status: AC
  Administered 2021-11-25: 100 mg via ORAL
  Filled 2021-11-25: qty 1

## 2021-11-25 MED ORDER — BISACODYL 10 MG RE SUPP
10.0000 mg | Freq: Every day | RECTAL | Status: DC | PRN
  Administered 2021-11-25: 18:00:00 10 mg via RECTAL
  Filled 2021-11-25: qty 1

## 2021-11-25 NOTE — Progress Notes (Signed)
Mobility Specialist Progress Note:   11/25/21 1533  Mobility  Bed Position Chair  Activity Transferred from bed to chair  Level of Assistance +2 (takes two people)  Assistive Device None  Distance Ambulated (ft) 2 ft  Activity Response Tolerated well  $Mobility charge 1 Mobility   Pt received in bed wanting to sit in the chair. ModA +2 to stand a pivot to recliner. Left in recliner with call bell in reach and all needs met.   [facility] Public librarian Phone [phone]

## 2021-11-25 NOTE — Progress Notes (Signed)
HD#2 SUBJECTIVE:  Patient Summary: [name] [name] is a 71 y.o. with a pertinent PMH of DM2 and CKD stage 5, who presented with LOC and admitted for syncope.   Overnight Events: no acute events overnight    Interm History: Patient seen and evaluated at bedside. States she is doing okay. Complains of pain in leg. She says that she is tired of all of the medications and being in the hospital but that she also wants to feel better and be able to walk comfortably again. She reports she plans on living with her sister once discharged from hospital. Also discussed renal cysts and lung nodules seen on previous imaging.     OBJECTIVE:  Vital Signs: Vitals:   11/24/21 1924 11/24/21 1925 11/25/21 0539 11/25/21 0807  BP: (!) 155/67 (!) 155/67 127/61 (!) 150/79  Pulse: 76 78 69 81  Resp: 17  18 18   Temp: 98.1 F (36.7 C) 98.1 F (36.7 C) 98.9 F (37.2 C) 98.3 F (36.8 C)  TempSrc: Oral Oral Oral Oral  SpO2: 99% 99% 98% 99%  Weight:      Height:       Constitutional: Well-developed, well-nourished, alert, no acute distress Neck: Normal range of motion.  Cardiovascular: Normal rate, regular rhythm, intact distal pulses. No gallop and no friction rub. No murmur heard.  Pulmonary: Non labored breathing on room air, crackles bilateral lower lobes Abdominal: Soft.  +bowel sounds. Non distended. Improved tenderness, nonperitonitic Musculoskeletal: Normal range of motion. Tender lower extremities, right more edematous than left, nonpitting Neurological: Alert and oriented, thoughts clear.  Skin: wound left shin.    Pertinent Labs: CBC Latest Ref Rng & Units 11/25/2021 11/23/2021 11/22/2021  WBC 4.0 - 10.5 K/uL 8.7 9.9 18.7(H)  Hemoglobin 12.0 - 15.0 g/dL 7.1(L) 7.4(L) 8.4(L)  Hematocrit 36.0 - 46.0 % 23.6(L) 24.1(L) 27.6(L)  Platelets 150 - 400 K/uL 252 260 282    CMP Latest Ref Rng & Units 11/25/2021 11/24/2021 11/23/2021  Glucose 70 - 99 mg/dL 154(H) 125(H) 199(H)  BUN 8 - 23 mg/dL  169(H) 171(H) 189(H)  Creatinine 0.44 - 1.00 mg/dL 2.98(H) 3.03(H) 3.42(H)  Sodium 135 - 145 mmol/L 141 139 137  Potassium 3.5 - 5.1 mmol/L 4.0 3.7 3.4(L)  Chloride 98 - 111 mmol/L 98 98 96(L)  CO2 22 - 32 mmol/L 30 28 27   Calcium 8.9 - 10.3 mg/dL 10.0 9.5 9.2  Total Protein 6.5 - 8.1 g/dL 6.3(L) - 5.5(L)  Total Bilirubin 0.3 - 1.2 mg/dL 0.2(L) - 0.3  Alkaline Phos 38 - 126 U/L 45 - 50  AST 15 - 41 U/L 18 - 18  ALT 0 - 44 U/L 9 - 12     ASSESSMENT/PLAN:   Assessment:  #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 Patient with persistently elevated serum creatinine as well as BUN. She is still able to produce urine.  CKD is managed by nephrology outpatient and they are currently recommending dialysis however she has declined HD in the past. She has had left first stage basilic vein transposition performed 03/2020, but did not follow through with dialysis. She states that her reason for not wanting to pursue HD is that she is afraid of what it means. Her sister, [name], is a source of support for her, however, attempts to contact her thus far have been unsuccessful.  She has symptomatic uremia contributing to syncope and declining functional status at home. Patient had expressed willingness to discuss HD with nephrology yesterday, however, upon  their meeting with the patient, she again refused HD. Have had several discussion with patient at this point that her renal disease is driving many of her current symptoms and complaints, and that without dialysis, we are not able to fix her underlying disease. Palliative care has been consulted to assist with determining her desires and goals of care. Will need to have ongoing goals of care discussions with patient to determine her desires. She emphasized to the team today that she strongly does not want dialysis at this point, but is willing to have it in emergent situations. Although, dialysis would be definitive treatment, we will continue to  manage her symptoms and problems as they arise.  - Avoid nephrotoxic drugs and dose medications per GFR. She has neuropathic pain in her legs and can be given small doses 100mg  Gabapentin as needed for a maximum of 300mg  daily. For more severe pain, dilaudid can be given.  - she is receiving Sevelamer for her hyperphosphatemia  - Patient has been receiving lasix 80mg  IV with IV albumin 25G for diuresis with improvement in her BUN, Cr, and volume overload. No significant change in BUN and Cr today compared to yesterday. She is not volume overloaded anymore either with no plan for IV diuresis today. Her home regimen can be resumed upon discharge. - Many of patient's current complaints are due to underlying CKD which cannot be fixed in the hospital without dialysis. She no longer requires diuresis and treatment can be managed as an outpatient. Although she remains uremic, it has been chronically elevated and she is currently able to [facility] appropriately. If lab work looks stable tomorrow, plan for discharge with appropriate follow up.   #Chronic HFmrEF Patient's echo 02/2021.  EF noted to be 45 to 50% at that time.  There was no wall motion abnormalities.  She was noted to have left ventricular hypertrophy, no valvulopathy, left atrium was noted to be severely dilated. ECHO shows mild worsening of EF to 40-45% in setting of worsening uremia.  Patient is on torsemide milligrams twice daily per EMR as well as metolazone 3 times a week. She has received IV diuresis in the hospital and has reached euvolemia. Home diuretic regimen can be resumed on discharge.    #Syncope Likely multifactorial etiologies including vasovagal from pain, situational from defecation, uremia and anemia. Anticipate this will improve if she accepts HD this admission.   #Constipation Generalized abdominal pain. Imaging revealed large stool burden. - miralax and senna daily    #T2DM Patient's last A1c was 02/2021 and noted to be 6.8.   Per EMR she takes 75/25 insulin 40 units in the a.m. and 30 units in the evening as well as Trulicity 1.5 mg weekly.  Patient is also on Reglan with 3 times daily dosing. -We will start patient on sliding scale insulin and continue her home Reglan.  Will uptitrate as necessary.  #Renal mass  Patient noted to have interval enlargement of L kidney exophytic density (now 3.2 cm) compared to 02/2021 imaging - patient underwent MRI imaging of this mass. 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    #LLL nodules Patient with left lower lobe nodules noted on previous imaging.  However, on CT obtained this hospitalization there noted to be larger, with the largest measuring 2.4 cm.  At risk for malignancy or metastatic disease and an outpatient PET scan is  recommended for further evaluation. Will plan to arrange for this after discharge.   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 Patient was noted to be significantly iron deficient on labs 09/2021 with a ferritin of 11 and a transferrin saturation of 7%.  She did receive IV iron in the past for this most recently, Feraheme 10/04/2021. - she has been started on IV iron. Will plan to follow daily CBC  Signature: Delene Ruffini, MD  Internal Medicine Resident, PGY-1 [facility]  Pager: [phone] 9:10 AM, 11/25/2021   Please contact the on call pager after 5 pm and on weekends at [phone].

## 2021-11-25 NOTE — Discharge Summary (Incomplete)
Name: [name] [name] MRN: [id number] DOB: 1950/10/29 71 y.o. PCP: [name], MD  Date of Admission: 11/21/2021  1:40 PM Date of Discharge: 12/04/21 Attending Physician: Dr. Philipp Ovens  Discharge Diagnosis: Principal Problem:   Syncope Active Problems:   CKD (chronic kidney disease) stage 5, GFR less than 15 ml/min (HCC)   Kidney lesion, native, left    Discharge Medications: Allergies as of 11/25/2021       Reactions   Nebivolol Swelling   Chest pain (reaction to Bystolic)   Ace Inhibitors Swelling, Other (See Comments)   Tongue swell   Morphine And Related Itching     Med Rec must be completed prior to using this [facility]***       Disposition and follow-up:   [name] [name] was discharged from [facility] in {DISCHARGE CONDITION:19696} condition.  At the hospital follow up visit please address:  1.  Follow-up:  a. ESRD - patient to follow with [facility]. She will be on TTS schedule. TDC placed, AVG done 2/24    b.   c.   d.  2.  Labs / imaging needed at time of follow-up: {Labs:13245}  3.  Pending labs/ test needing follow-up: ***  4.  Medication Changes  Started:  Stopped:  Changed:  Abx -   End Date:  Follow-up Appointments:  [place] [place] [facility] up.   Specialty: Home Health Services Contact information: [address] [place] [address] [phone]                 Hospital Course by problem list: *** Syncopal episode o ntoilet, was straining, had vomiting, passed out. ? Sz activity Aid thinks there may have been shaking New renal and lung cancer New finding on head ct, had stroke in past Admit for sz workup Trop 84, stable,  Cr is high at 3 plan for hd access if she needs it. Eeg, mri    Since yesterday cramping pain, lower part of stomach. Unprovoked. Had a heavy bowel movment. Noticed some blood in her stool. Pain stayed  the same after bowel movement.   Real dark stools.   Threw up twice but no blood.   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      Discharge Subjective:   Discharge Exam:   BP (!) 150/79 (BP Location: Right Arm)    Pulse 81    Temp 98.3 F (36.8 C) (Oral)    Resp 18    Ht 5\' 3"  (1.6 m)    Wt 93.3 kg    SpO2 99%    BMI 36.44 kg/m  Constitutional: well-appearing *** sitting in ***, in no acute distress HENT: normocephalic atraumatic, mucous membranes moist Eyes: conjunctiva non-erythematous Neck: supple Cardiovascular: regular rate and rhythm, no m/r/g Pulmonary/Chest: normal work of breathing on room air, lungs clear to auscultation bilaterally Abdominal: soft, non-tender, non-distended MSK: normal bulk and tone Neurological: alert & oriented x 3, 5/5 strength in bilateral upper and lower extremities, normal gait Skin: warm and dry Psych: ***   Pertinent Labs, Studies, and Procedures:  CBC Latest Ref Rng & Units 11/25/2021 11/23/2021 11/22/2021  WBC 4.0 - 10.5 K/uL 8.7 9.9 18.7(H)  Hemoglobin 12.0 - 15.0 g/dL 7.1(L) 7.4(L) 8.4(L)  Hematocrit 36.0 - 46.0 % 23.6(L) 24.1(L) 27.6(L)  Platelets 150 - 400 K/uL 252 260 282    CMP Latest Ref Rng & Units 11/25/2021 11/24/2021 11/23/2021  Glucose 70 - 99  mg/dL 154(H) 125(H) 199(H)  BUN 8 - 23 mg/dL 169(H) 171(H) 189(H)  Creatinine 0.44 - 1.00 mg/dL 2.98(H) 3.03(H) 3.42(H)  Sodium 135 - 145 mmol/L 141 139 137  Potassium 3.5 - 5.1 mmol/L 4.0 3.7 3.4(L)  Chloride 98 - 111 mmol/L 98 98 96(L)  CO2 22 - 32 mmol/L 30 28 27   Calcium 8.9 - 10.3 mg/dL 10.0 9.5 9.2  Total Protein 6.5 - 8.1 g/dL 6.3(L) - 5.5(L)  Total Bilirubin 0.3 - 1.2 mg/dL 0.2(L) - 0.3  Alkaline Phos 38 - 126 U/L 45 - 50  AST 15 - 41 U/L 18 - 18  ALT 0 - 44 U/L 9 - 12    CT Abdomen Pelvis Wo Contrast  Result Date: 11/21/2021 CLINICAL DATA:  Acute generalized abdominal pain. EXAM: CT ABDOMEN AND PELVIS WITHOUT CONTRAST TECHNIQUE: Multidetector CT imaging of the abdomen and pelvis was performed  following the standard protocol without IV contrast. RADIATION DOSE REDUCTION: This exam was performed according to the departmental dose-optimization program which includes automated exposure control, adjustment of the mA and/or kV according to patient size and/or use of iterative reconstruction technique. COMPARISON:  Feb 15, 2021. FINDINGS: Lower chest: 2 left lower lobe nodules noted on prior exam have significantly enlarged. The largest measures 2.4 x 1.8 cm. This is highly concerning for malignancy and PET scan is recommended for further evaluation. Hepatobiliary: Status post cholecystectomy. No biliary dilatation is noted. No definite hepatic abnormality is noted. Pancreas: Unremarkable. No pancreatic ductal dilatation or surrounding inflammatory changes. Spleen: Normal in size without focal abnormality. Adrenals/Urinary Tract: Stable bilateral adrenal gland enlargement. Bilateral renal cysts are noted. Interval enlargement of 3.2 cm exophytic density arising from lower pole of left kidney concerning for neoplasm or malignancy. No hydronephrosis or renal obstruction is noted. Urinary bladder is unremarkable. Stomach/Bowel: The stomach appears normal. There is no evidence of bowel obstruction or inflammation. Stool seen throughout the colon. Vascular/Lymphatic: Aortic atherosclerosis. No enlarged abdominal or pelvic lymph nodes. Reproductive: Status post hysterectomy. No adnexal masses. Other: No abdominal wall hernia or abnormality. No abdominopelvic ascites. Musculoskeletal: Status post right total hip arthroplasty. No definite acute osseous abnormality is noted. IMPRESSION: Interval large mint of 3.2 cm exophytic soft tissue abnormality arising from lower pole of left kidney concerning for renal cell carcinoma. Further evaluation with MRI is recommended. Two left lower lobe nodules are noted which are significantly enlarged compared to prior exam, the largest measuring 2.4 cm. This is concerning for  malignancy or metastatic disease. PET scan is recommended for further evaluation. Stable bilateral adrenal gland enlargement is noted. Aortic Atherosclerosis (ICD10-I70.0). Electronically Signed   By: Marijo Conception M.D.   On: 11/21/2021 15:20   DG Chest 2 View  Result Date: 11/21/2021 CLINICAL DATA:  A 71 year old female presents for evaluation of weakness, possible seizure. EXAM: CHEST - 2 VIEW COMPARISON:  Comparison made with July 04, 2021. FINDINGS: Exam limited by portable technique and patient body habitus. Heart size is enlarged and stable compared to previous imaging. Central pulmonary vascular engorgement without frank edema. Nodular density in the LEFT lower chest measuring up to 2 cm. This is in the LEFT retrocardiac region. No lobar consolidation. No sign of pneumothorax or of pleural effusion. On limited assessment there is no acute skeletal process. IMPRESSION: 1. Nodular density in the LEFT retrocardiac region measuring up to 2 cm. Suggest chest CT for further evaluation. 2. Stable marked cardiomegaly with central pulmonary vascular engorgement but no frank edema. Electronically Signed   By: Cay Schillings  Wile M.D.   On: 11/21/2021 14:49   CT Head Wo Contrast  Result Date: 11/21/2021 CLINICAL DATA:  Head trauma.  Weakness.  Possible syncopal event. EXAM: CT HEAD WITHOUT CONTRAST TECHNIQUE: Contiguous axial images were obtained from the base of the skull through the vertex without intravenous contrast. RADIATION DOSE REDUCTION: This exam was performed according to the departmental dose-optimization program which includes automated exposure control, adjustment of the mA and/or kV according to patient size and/or use of iterative reconstruction technique. COMPARISON:  Head CT 03/16/2019 FINDINGS: Brain: There is no evidence of an acute infarct, intracranial hemorrhage, mass, midline shift, or extra-axial fluid collection. 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 Hypodensities in the  cerebral white matter bilaterally are unchanged and nonspecific but compatible with mild chronic small vessel ischemic disease. Mild cerebral atrophy is within normal limits for age. Vascular: Calcified atherosclerosis at the skull base. Skull: No acute fracture or suspicious osseous lesion. Sinuses/Orbits: Visualized paranasal sinuses and mastoid air cells are clear. Bilateral cataract extraction. Other: None. IMPRESSION: 1. No evidence of acute intracranial abnormality. 2. Mild chronic small vessel ischemic disease. Interval chronic right basal ganglia infarct. Electronically Signed   By: Logan Bores M.D.   On: 11/21/2021 15:27   EEG adult  Result Date: 11/22/2021 Greta Doom, MD     11/22/2021  1:17 PM History: 71 yo F being evaluated for syncope. Sedation: None Technique: This EEG was acquired with electrodes placed according to the International 10-20 electrode system (including Fp1, Fp2, F3, F4, C3, C4, P3, P4, O1, O2, T3, T4, T5, T6, A1, A2, Fz, Cz, Pz). The following electrodes were missing or displaced: none. Background: There is a posterior dominant rhythm of 8-9 Hz which is poorly sustained. There are occasional bifrontally predominant discharges with triphasic morphology. There eis diffuse high voltage irregular delta and theta range activity throughout the study. Photic stimulation: Physiologic driving is not performed EEG Abnormalities: 1) Triphasic waves 2) generalized slow activity. Clinical Interpretation: This EEG is consistent with a generalized non-specific cerebral dysfunction(encephalopathy). There was no seizure or seizure predisposition recorded on this study. Please note that lack of epileptiform activity on EEG does not preclude the possibility of epilepsy. Roland Rack, MD [facility] [phone] If 7pm- 7am, please page neurology on call as listed in [place].   ECHOCARDIOGRAM COMPLETE  Result Date: 11/22/2021    ECHOCARDIOGRAM REPORT   Patient Name:    [name] [name] Date of Exam: 11/22/2021 Medical Rec #:  [id number]         Height:       67.0 in Accession #:    [id number]        Weight:       198.0 lb Date of Birth:  December 15, 1950          BSA:          2.014 m Patient Age:    52 years          BP:           127/56 mmHg Patient Gender: F                 HR:           73 bpm. Exam Location:  Inpatient Procedure: 2D Echo, Cardiac Doppler and Color Doppler Indications:    I50.21 CHF  History:        Patient has prior history of Echocardiogram examinations, most  recent 02/16/2021. CHF, TIA and COPD; Risk Factors:Hypertension,                 Dyslipidemia and Diabetes. GERD.  Sonographer:    Beryle Beams Referring Phys: [place]  1. Left ventricular ejection fraction, by estimation, is 40 to 45%. The left ventricle has mildly decreased function. The left ventricle demonstrates global hypokinesis. The left ventricular internal cavity size was mildly dilated. There is mild left ventricular hypertrophy. Left ventricular diastolic parameters are consistent with Grade I diastolic dysfunction (impaired relaxation).  2. Right ventricular systolic function is normal. The right ventricular size is normal. There is mildly elevated pulmonary artery systolic pressure.  3. Left atrial size was severely dilated.  4. Right atrial size was mildly dilated.  5. The mitral valve is normal in structure. Trivial mitral valve regurgitation. No evidence of mitral stenosis.  6. The aortic valve was not well visualized. There is mild calcification of the aortic valve. Aortic valve regurgitation is not visualized. Mild aortic valve stenosis. Aortic valve area, by VTI measures 1.32 cm. Aortic valve mean gradient measures 8.0 mmHg. Aortic valve Vmax measures 2.05 m/s.  7. The inferior vena cava is dilated in size with >50% respiratory variability, suggesting right atrial pressure of 8 mmHg. FINDINGS  Left Ventricle: Left ventricular ejection fraction,  by estimation, is 40 to 45%. The left ventricle has mildly decreased function. The left ventricle demonstrates global hypokinesis. The left ventricular internal cavity size was mildly dilated. There is  mild left ventricular hypertrophy. Left ventricular diastolic parameters are consistent with Grade I diastolic dysfunction (impaired relaxation). Right Ventricle: The right ventricular size is normal. No increase in right ventricular wall thickness. Right ventricular systolic function is normal. There is mildly elevated pulmonary artery systolic pressure. The tricuspid regurgitant velocity is 3.00  m/s, and with an assumed right atrial pressure of 3 mmHg, the estimated right ventricular systolic pressure is 54.2 mmHg. Left Atrium: Left atrial size was severely dilated. Right Atrium: Right atrial size was mildly dilated. Pericardium: There is no evidence of pericardial effusion. Mitral Valve: The mitral valve is normal in structure. There is mild thickening of the mitral valve leaflet(s). Trivial mitral valve regurgitation. No evidence of mitral valve stenosis. MV peak gradient, 35.9 mmHg. The mean mitral valve gradient is 25.5 mmHg. Tricuspid Valve: The tricuspid valve is normal in structure. Tricuspid valve regurgitation is trivial. No evidence of tricuspid stenosis. Aortic Valve: The aortic valve was not well visualized. There is mild calcification of the aortic valve. Aortic valve regurgitation is not visualized. Mild aortic stenosis is present. Aortic valve mean gradient measures 8.0 mmHg. Aortic valve peak gradient measures 16.8 mmHg. Aortic valve area, by VTI measures 1.32 cm. Pulmonic Valve: The pulmonic valve was normal in structure. Pulmonic valve regurgitation is not visualized. No evidence of pulmonic stenosis. Aorta: The aortic root is normal in size and structure. Venous: The inferior vena cava is dilated in size with greater than 50% respiratory variability, suggesting right atrial pressure of 8 mmHg.  IAS/Shunts: No atrial level shunt detected by color flow Doppler.  LEFT VENTRICLE PLAX 2D LVIDd:         5.50 cm      Diastology LVIDs:         4.00 cm      LV e' medial:    8.81 cm/s LV PW:         1.40 cm      LV E/e' medial:  8.3 LV IVS:  1.00 cm      LV e' lateral:   7.51 cm/s LVOT diam:     2.00 cm      LV E/e' lateral: 9.7 LV SV:         51 LV SV Index:   25 LVOT Area:     3.14 cm  LV Volumes (MOD) LV vol d, MOD A2C: 133.0 ml LV vol d, MOD A4C: 105.0 ml LV vol s, MOD A2C: 75.3 ml LV vol s, MOD A4C: 58.5 ml LV SV MOD A2C:     57.7 ml LV SV MOD A4C:     105.0 ml LV SV MOD BP:      55.9 ml RIGHT VENTRICLE             IVC RV S prime:     12.70 cm/s  IVC diam: 2.00 cm TAPSE (M-mode): 1.9 cm LEFT ATRIUM             Index        RIGHT ATRIUM           Index LA diam:        3.80 cm 1.89 cm/m   RA Area:     13.70 cm LA Vol (A2C):   82.3 ml 40.87 ml/m  RA Volume:   33.90 ml  16.84 ml/m LA Vol (A4C):   51.8 ml 25.72 ml/m LA Biplane Vol: 69.1 ml 34.32 ml/m  AORTIC VALVE                     PULMONIC VALVE AV Area (Vmax):    1.07 cm      PV Vmax:       0.94 m/s AV Area (Vmean):   1.08 cm      PV Vmean:      66.000 cm/s AV Area (VTI):     1.32 cm      PV VTI:        0.178 m AV Vmax:           205.00 cm/s   PV Peak grad:  3.5 mmHg AV Vmean:          133.000 cm/s  PV Mean grad:  2.0 mmHg AV VTI:            0.383 m AV Peak Grad:      16.8 mmHg AV Mean Grad:      8.0 mmHg LVOT Vmax:         70.00 cm/s LVOT Vmean:        45.800 cm/s LVOT VTI:          0.161 m LVOT/AV VTI ratio: 0.42  AORTA Ao Root diam: 2.40 cm Ao Asc diam:  2.60 cm MITRAL VALVE                TRICUSPID VALVE MV Area (PHT): 1.70 cm     TR Peak grad:   36.0 mmHg MV Area VTI:   0.59 cm     TR Mean grad:   24.0 mmHg MV Peak grad:  35.9 mmHg    TR Vmax:        300.00 cm/s MV Mean grad:  25.5 mmHg    TR Vmean:       234.0 cm/s MV Vmax:       3.00 m/s MV Vmean:      240.5 cm/s   SHUNTS MV Decel Time: 447 msec     Systemic VTI:  0.16 m MV E velocity:  72.70 cm/s   Systemic Diam: 2.00 cm MV A velocity: 120.00 cm/s MV E/A ratio:  0.61 Glori Bickers MD Electronically signed by Glori Bickers MD Signature Date/Time: 11/22/2021/6:25:03 PM    Final    VAS [name] LOWER EXTREMITY VENOUS (DVT)  Result Date: 11/22/2021  Lower Venous DVT Study Patient Name:  RISHIKA MCCOLLOM  Date of Exam:   11/22/2021 Medical Rec #: [id number]          Accession #:    [id number] Date of Birth: [birth year]           Patient Gender: F Patient Age:   22 years Exam Location:  [name] Procedure:      VAS [name] LOWER EXTREMITY VENOUS (DVT) Referring Phys: Dorian Pod --------------------------------------------------------------------------------  Indications: Pain, swelling RT>LT.  Limitations: Body habitus, acoustic shadowing due to overlying arterial calcification, patient intolerance to probe pressure at some segments. Comparison Study: 07-01-2021 Multiple priors. Most recent prior bilateral lower                   extremity venous was negative for DVT. Performing Technologist: Darlin Coco RDMS, RVT  Examination Guidelines: A complete evaluation includes B-mode imaging, spectral Doppler, color Doppler, and power Doppler as needed of all accessible portions of each vessel. Bilateral testing is considered an integral part of a complete examination. Limited examinations for reoccurring indications may be performed as noted. The reflux portion of the exam is performed with the patient in reverse Trendelenburg.  +---------+---------------+---------+-----------+----------+-------------------+  RIGHT     Compressibility Phasicity Spontaneity Properties Thrombus Aging       +---------+---------------+---------+-----------+----------+-------------------+  CFV       Full            Yes       Yes                                         +---------+---------------+---------+-----------+----------+-------------------+  SFJ       Full                                                                   +---------+---------------+---------+-----------+----------+-------------------+  FV Prox   Full                                                                  +---------+---------------+---------+-----------+----------+-------------------+  FV Mid    Full                                                                  +---------+---------------+---------+-----------+----------+-------------------+  FV Distal Full                                                                  +---------+---------------+---------+-----------+----------+-------------------+  PFV       Full                                                                  +---------+---------------+---------+-----------+----------+-------------------+  POP       Full            Yes       Yes                                         +---------+---------------+---------+-----------+----------+-------------------+  PTV                       Yes       Yes                    Patent by color,                                                                 limited                                                                          visualization        +---------+---------------+---------+-----------+----------+-------------------+  PERO                      Yes       Yes                    Patent by color,                                                                 limited                                                                          visualization        +---------+---------------+---------+-----------+----------+-------------------+ Evidence of arterial disease noted on today's examination. Patient with history of PAD and abnormal ABI.  +----+---------------+---------+-----------+----------+--------------+  LEFT Compressibility Phasicity Spontaneity Properties Thrombus Aging  +----+---------------+---------+-----------+----------+--------------+  CFV  Full            Yes       Yes                                     +----+---------------+---------+-----------+----------+--------------+  Summary: RIGHT: - There is no evidence of deep vein thrombosis in the lower extremity. However, portions of this examination were limited- see technologist comments above.  - No cystic structure found in the popliteal fossa.  LEFT: - No evidence of common femoral vein obstruction.  *See table(s) above for measurements and observations. Electronically signed by Harold Barban MD on 11/22/2021 at 7:40:21 PM.    Final      Discharge Instructions:   Signed: Delene Ruffini, MD 11/25/2021, 9:28 AM   Pager: [phone]

## 2021-11-25 NOTE — Progress Notes (Signed)
Nephrology Follow-Up Consult note   Assessment/Recommendations: [name] [name] is a/an 71 y.o. female with a past medical history significant for HTN, obesity, DM 2, COPD, CHF, admitted for syncopal episode complicated by AKI.       Non-Oliguric AKI/azotemia: Creatinine not much higher than baseline which is 2.5-3.5.  However, BUN fairly elevated.  Unclear what caused BUN elevation to be so high out of proportion to creatinine.  Fortunately BUN has improved significantly in general but remains elevated -Hold further diuretics today -Continue discussions regarding dialysis.  Patient has been somewhat hesitant to move forward with dialysis in the past.  Fortunately does not need today.  Palliative care involved -Continue to monitor daily Cr, Dose meds for GFR -Monitor Daily I/Os, Daily weight  -Maintain MAP>65 for optimal renal perfusion.  -Avoid nephrotoxic medications including NSAIDs -Use synthetic opioids (Fentanyl/Dilaudid) if needed  Congestive heart failure: EF around 40 to 66% with diastolic dysfunction.  Lower extremity edema persistent but also may be multifactorial.  Holding diuresis today  Vascular access: Status post for stage basilic vein transposition in June 2021.  May need to move forward with more permanent access based on progress and conversations  Hyperphosphatemia: Sevelamer  Anemia of CKD: Iron deficiency.  IV iron ordered.  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  Follow-up 3 to 6 months.  HTN: Coreg 12.5 mg twice daily.  Blood pressure goal  Right lower extremity edema: Predominantly on the right side.  Had PVL of the right leg on 2/15 that was negative for DVT.   Recommendations conveyed to primary service.    [facility] 11/25/2021 9:11 AM  ___________________________________________________________  CC: Azotemia  Interval History/Subjective: Patient states she does not  feel well today but was unable to say what exactly felt bad.  Denies any significant confusion, nausea, vomiting, decreased appetite.   Medications:  Current Facility-Administered Medications  Medication Dose Route Frequency Provider Last Rate Last Admin   acetaminophen (TYLENOL) tablet 650 mg  650 mg Oral Q6H PRN Rick Duff, MD   650 mg at 11/25/21 4403   Or   acetaminophen (TYLENOL) suppository 650 mg  650 mg Rectal Q6H PRN Rick Duff, MD       carvedilol (COREG) tablet 12.5 mg  12.5 mg Oral BID Rick Duff, MD   12.5 mg at 11/25/21 0826   heparin injection 5,000 Units  5,000 Units Subcutaneous Q8H Rick Duff, MD   5,000 Units at 11/25/21 4742   insulin aspart (novoLOG) injection 0-9 Units  0-9 Units Subcutaneous TID WC Rick Duff, MD   1 Units at 11/25/21 0825   insulin glargine-yfgn (SEMGLEE) injection 10 Units  10 Units Subcutaneous Daily Delene Ruffini, MD   10 Units at 11/25/21 0826   ipratropium-albuterol (DUONEB) 0.5-2.5 (3) MG/3ML nebulizer solution 3 mL  3 mL Nebulization Q4H PRN Rick Duff, MD       levothyroxine (SYNTHROID) tablet 200 mcg  200 mcg Oral Q0600 Rick Duff, MD   200 mcg at 11/25/21 5956   metoCLOPramide (REGLAN) tablet 5 mg  5 mg Oral TID Rick Duff, MD   5 mg at 11/25/21 0826   pantoprazole (PROTONIX) EC tablet 40 mg  40 mg Oral QHS Donnamae Jude, RPH   40 mg at 11/24/21 2104   polyethylene glycol (MIRALAX / GLYCOLAX) packet 17 g  17 g Oral Daily PRN Delene Ruffini, MD   17 g at 11/24/21 1645   sevelamer carbonate (RENVELA) tablet  800 mg  800 mg Oral TID WC Rosita Fire, MD   800 mg at 11/25/21 0825      Review of Systems: 10 systems reviewed and negative except per interval history/subjective  Physical Exam: Vitals:   11/25/21 0539 11/25/21 0807  BP: 127/61 (!) 150/79  Pulse: 69 81  Resp: 18 18  Temp: 98.9 F (37.2 C) 98.3 F (36.8 C)  SpO2: 98% 99%   No intake/output data  recorded.  Intake/Output Summary (Last 24 hours) at 11/25/2021 0911 Last data filed at 11/25/2021 0600 Gross per 24 hour  Intake 240 ml  Output 600 ml  Net -360 ml   Constitutional: well-appearing, no acute distress ENMT: ears and nose without scars or lesions, MMM CV: normal rate, 1+ pitting edema of the right lower extremity wit less edema on the left Respiratory: Bilateral chest rise, normal work of breathing Gastrointestinal: soft, non-tender, no palpable masses or hernias Skin: no visible lesions or rashes Neuro: Asterixis negative Psych: alert, judgement/insight appropriate, appropriate mood and affect   Test Results I personally reviewed new and old clinical labs and radiology tests Lab Results  Component Value Date   NA 141 11/25/2021   K 4.0 11/25/2021   CL 98 11/25/2021   CO2 30 11/25/2021   BUN 169 (H) 11/25/2021   CREATININE 2.98 (H) 11/25/2021   CALCIUM 10.0 11/25/2021   ALBUMIN 3.6 11/25/2021   PHOS 4.3 11/25/2021    CBC Recent Labs  Lab 11/21/21 1413 11/22/21 0259 11/23/21 0130 11/25/21 0110  WBC 11.0* 18.7* 9.9 8.7  NEUTROABS 9.3*  --   --   --   HGB 7.9* 8.4* 7.4* 7.1*  HCT 26.6* 27.6* 24.1* 23.6*  MCV 89.9 88.5 87.3 87.4  PLT 265 282 260 252

## 2021-11-26 ENCOUNTER — Inpatient Hospital Stay (HOSPITAL_COMMUNITY): Payer: Medicare Other

## 2021-11-26 DIAGNOSIS — M79601 Pain in right arm: Secondary | ICD-10-CM

## 2021-11-26 DIAGNOSIS — R609 Edema, unspecified: Secondary | ICD-10-CM | POA: Diagnosis not present

## 2021-11-26 DIAGNOSIS — R064 Hyperventilation: Secondary | ICD-10-CM

## 2021-11-26 DIAGNOSIS — N185 Chronic kidney disease, stage 5: Secondary | ICD-10-CM | POA: Diagnosis not present

## 2021-11-26 LAB — CBC WITH DIFFERENTIAL/PLATELET
Abs Immature Granulocytes: 0.15 10*3/uL — ABNORMAL HIGH (ref 0.00–0.07)
Basophils Absolute: 0 10*3/uL (ref 0.0–0.1)
Basophils Relative: 0 %
Eosinophils Absolute: 0.1 10*3/uL (ref 0.0–0.5)
Eosinophils Relative: 0 %
HCT: 28.8 % — ABNORMAL LOW (ref 36.0–46.0)
Hemoglobin: 8.8 g/dL — ABNORMAL LOW (ref 12.0–15.0)
Immature Granulocytes: 1 %
Lymphocytes Relative: 2 %
Lymphs Abs: 0.5 10*3/uL — ABNORMAL LOW (ref 0.7–4.0)
MCH: 26.7 pg (ref 26.0–34.0)
MCHC: 30.6 g/dL (ref 30.0–36.0)
MCV: 87.5 fL (ref 80.0–100.0)
Monocytes Absolute: 0.9 10*3/uL (ref 0.1–1.0)
Monocytes Relative: 4 %
Neutro Abs: 19.4 10*3/uL — ABNORMAL HIGH (ref 1.7–7.7)
Neutrophils Relative %: 93 %
Platelets: 282 10*3/uL (ref 150–400)
RBC: 3.29 MIL/uL — ABNORMAL LOW (ref 3.87–5.11)
RDW: 21.8 % — ABNORMAL HIGH (ref 11.5–15.5)
WBC: 21 10*3/uL — ABNORMAL HIGH (ref 4.0–10.5)
nRBC: 0.1 % (ref 0.0–0.2)

## 2021-11-26 LAB — BASIC METABOLIC PANEL
Anion gap: 14 (ref 5–15)
BUN: 157 mg/dL — ABNORMAL HIGH (ref 8–23)
CO2: 27 mmol/L (ref 22–32)
Calcium: 10.1 mg/dL (ref 8.9–10.3)
Chloride: 98 mmol/L (ref 98–111)
Creatinine, Ser: 2.94 mg/dL — ABNORMAL HIGH (ref 0.44–1.00)
GFR, Estimated: 17 mL/min — ABNORMAL LOW (ref >60)
Glucose, Bld: 240 mg/dL — ABNORMAL HIGH (ref 70–99)
Potassium: 4.1 mmol/L (ref 3.5–5.1)
Sodium: 139 mmol/L (ref 135–145)

## 2021-11-26 LAB — GLUCOSE, CAPILLARY
Glucose-Capillary: 160 mg/dL — ABNORMAL HIGH (ref 70–99)
Glucose-Capillary: 141 mg/dL — ABNORMAL HIGH (ref 70–99)
Glucose-Capillary: 167 mg/dL — ABNORMAL HIGH (ref 70–99)
Glucose-Capillary: 193 mg/dL — ABNORMAL HIGH (ref 70–99)

## 2021-11-26 MED ORDER — AMOXICILLIN-POT CLAVULANATE 875-125 MG PO TABS: 1.0000 | ORAL_TABLET | Freq: Two times a day (BID) | ORAL | Status: DC

## 2021-11-26 MED ORDER — IPRATROPIUM-ALBUTEROL 0.5-2.5 (3) MG/3ML IN SOLN
3.0000 mL | Freq: Once | RESPIRATORY_TRACT | Status: AC
  Administered 2021-11-26: 3 mL via RESPIRATORY_TRACT
  Filled 2021-11-26: qty 3

## 2021-11-26 MED ORDER — HYDROMORPHONE HCL 2 MG PO TABS
2.0000 mg | ORAL_TABLET | Freq: Once | ORAL | Status: DC
  Filled 2021-11-26: qty 1

## 2021-11-26 MED ORDER — HYDROMORPHONE HCL 1 MG/ML IJ SOLN
0.5000 mg | Freq: Once | INTRAMUSCULAR | Status: DC
  Filled 2021-11-26: qty 0.5

## 2021-11-26 MED ORDER — AMOXICILLIN-POT CLAVULANATE 500-125 MG PO TABS
1.0000 | ORAL_TABLET | Freq: Two times a day (BID) | ORAL | Status: DC
  Administered 2021-11-26 – 2021-11-30 (×9): 500 mg via ORAL
  Filled 2021-11-26 (×10): qty 1

## 2021-11-26 MED ORDER — HYDRALAZINE HCL 50 MG PO TABS
50.0000 mg | ORAL_TABLET | Freq: Three times a day (TID) | ORAL | Status: DC
  Administered 2021-11-26 – 2021-12-06 (×20): 50 mg via ORAL
  Filled 2021-11-26 (×25): qty 1

## 2021-11-26 MED ORDER — HYDRALAZINE HCL 25 MG PO TABS
25.0000 mg | ORAL_TABLET | Freq: Three times a day (TID) | ORAL | Status: DC
  Administered 2021-11-26: 25 mg via ORAL
  Filled 2021-11-26: qty 1

## 2021-11-26 MED ORDER — ALBUMIN HUMAN 25 % IV SOLN
25.0000 g | Freq: Once | INTRAVENOUS | Status: AC
  Administered 2021-11-26: 25 g via INTRAVENOUS
  Filled 2021-11-26: qty 100

## 2021-11-26 MED ORDER — FUROSEMIDE 10 MG/ML IJ SOLN
80.0000 mg | Freq: Once | INTRAMUSCULAR | Status: AC
  Administered 2021-11-26: 80 mg via INTRAVENOUS
  Filled 2021-11-26: qty 8

## 2021-11-26 MED ORDER — ALBUMIN HUMAN 25 % IV SOLN: 25.0000 g | Freq: Four times a day (QID) | INTRAVENOUS | Status: DC

## 2021-11-26 NOTE — Progress Notes (Signed)
Right upper extremity venous duplex completed. Refer to "CV Proc" under chart review to view preliminary results.  Preliminary results discussed with Gretta Cool, RN.  11/26/2021 4:19 PM Kelby Aline., MHA, RVT, RDCS, RDMS

## 2021-11-26 NOTE — Progress Notes (Signed)
Nephrology Follow-Up Consult note   Assessment/Recommendations: [name] [name] is a/an 71 y.o. female with a past medical history significant for HTN, obesity, DM 2, COPD, CHF, admitted for syncopal episode complicated by AKI.       Non-Oliguric AKI/azotemia: Creatinine not much higher than baseline which is 2.5-3.5.  However, BUN fairly elevated.  Unclear what caused BUN elevation to be so high out of proportion to creatinine.  BUN remains high but continues to improve. She has not demonstrated signs of uremia thus far although she is altered today. I suspect this is delirium and not uremia given the BUN has done down -Continue to hold diuretics -Continue discussions regarding dialysis.  Patient has been somewhat hesitant to move forward with dialysis in the past.  Fortunately does not need today.  Palliative care involved. Patient may be leaving soon which is permissible with close nephrology follow up. However, AMS today may hinder DC plans unless she clears up later today -Continue to monitor daily Cr, Dose meds for GFR -Monitor Daily I/Os, Daily weight  -Maintain MAP>65 for optimal renal perfusion.  -Avoid nephrotoxic medications including NSAIDs -Use synthetic opioids (Fentanyl/Dilaudid) if needed  Congestive heart failure: EF around 40 to 10% with diastolic dysfunction.  Lower extremity edema persistent but also may be multifactorial.  Holding diuresis   Vascular access: Status post for stage basilic vein transposition in June 2021.  May need to move forward with more permanent access based on progress and conversations  Hyperphosphatemia: Sevelamer  Anemia of CKD: Iron deficiency.  IV iron ordered.  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  Follow-up 3 to 6 months.  HTN: Coreg 12.5 mg twice daily.  Blood pressure goal  Right lower extremity edema: Predominantly on the right side.  Had PVL of the right leg on 2/15 that was negative  for DVT.   Recommendations conveyed to primary service.    [facility] 11/26/2021 10:20 AM  ___________________________________________________________  CC: Azotemia  Interval History/Subjective: Patient more confused today.  Slow to follow my commands or answer specific questions.   Medications:  Current Facility-Administered Medications  Medication Dose Route Frequency Provider Last Rate Last Admin   acetaminophen (TYLENOL) tablet 650 mg  650 mg Oral Q6H PRN Rick Duff, MD   650 mg at 11/26/21 1751   Or   acetaminophen (TYLENOL) suppository 650 mg  650 mg Rectal Q6H PRN Rick Duff, MD       amoxicillin-clavulanate (AUGMENTIN) 500-125 MG per tablet 500 mg  1 tablet Oral BID Velna Ochs, MD       bisacodyl (DULCOLAX) suppository 10 mg  10 mg Rectal Daily PRN Rick Duff, MD   10 mg at 11/25/21 1825   carvedilol (COREG) tablet 12.5 mg  12.5 mg Oral BID Rick Duff, MD   12.5 mg at 11/26/21 0905   heparin injection 5,000 Units  5,000 Units Subcutaneous Q8H Rick Duff, MD   5,000 Units at 11/26/21 0258   hydrALAZINE (APRESOLINE) tablet 25 mg  25 mg Oral Q8H Rick Duff, MD   25 mg at 11/26/21 5277   HYDROmorphone (DILAUDID) tablet 2 mg  2 mg Oral Once Rick Duff, MD       insulin aspart (novoLOG) injection 0-9 Units  0-9 Units Subcutaneous TID [facility] Rick Duff, MD   2 Units at 11/26/21 0905   insulin glargine-yfgn (SEMGLEE) injection 10 Units  10 Units Subcutaneous Daily Delene Ruffini, MD   10 Units at 11/26/21  3903   ipratropium-albuterol (DUONEB) 0.5-2.5 (3) MG/3ML nebulizer solution 3 mL  3 mL Nebulization Q4H PRN Rick Duff, MD       levothyroxine (SYNTHROID) tablet 200 mcg  200 mcg Oral Q0600 Rick Duff, MD   200 mcg at 11/26/21 0604   metoCLOPramide (REGLAN) tablet 5 mg  5 mg Oral TID Rick Duff, MD   5 mg at 11/26/21 0905   pantoprazole (PROTONIX) EC tablet 40 mg  40  mg Oral QHS Donnamae Jude, RPH   40 mg at 11/25/21 2234   polyethylene glycol (MIRALAX / GLYCOLAX) packet 17 g  17 g Oral Daily PRN Delene Ruffini, MD   17 g at 11/24/21 1645   sevelamer carbonate (RENVELA) tablet 800 mg  800 mg Oral TID WC Rosita Fire, MD   800 mg at 11/26/21 0092      Review of Systems: 10 systems reviewed and negative except per interval history/subjective  Physical Exam: Vitals:   11/26/21 0812 11/26/21 0927  BP: 127/66 (!) 177/129  Pulse: 87 (!) 113  Resp: 19   Temp: 97.7 F (36.5 C)   SpO2:     No intake/output data recorded.  Intake/Output Summary (Last 24 hours) at 11/26/2021 1020 Last data filed at 11/26/2021 0700 Gross per 24 hour  Intake 540 ml  Output 500 ml  Net 40 ml   Constitutional: Tired appearing, lying in bed, no distress ENMT: ears and nose without scars or lesions, MMM CV: normal rate, 1+ pitting edema of the right lower extremity with less edema on the left Respiratory: Bilateral chest rise, normal work of breathing Gastrointestinal: soft, non-tender, no palpable masses or hernias Skin: no visible lesions or rashes Neuro: Confused, slow to follow commands, negative asterixis   Test Results I personally reviewed new and old clinical labs and radiology tests Lab Results  Component Value Date   NA 139 11/26/2021   K 4.1 11/26/2021   CL 98 11/26/2021   CO2 27 11/26/2021   BUN 157 (H) 11/26/2021   CREATININE 2.94 (H) 11/26/2021   CALCIUM 10.1 11/26/2021   ALBUMIN 3.6 11/25/2021   PHOS 4.3 11/25/2021    CBC Recent Labs  Lab 11/21/21 1413 11/22/21 0259 11/23/21 0130 11/25/21 0110  WBC 11.0* 18.7* 9.9 8.7  NEUTROABS 9.3*  --   --   --   HGB 7.9* 8.4* 7.4* 7.1*  HCT 26.6* 27.6* 24.1* 23.6*  MCV 89.9 88.5 87.3 87.4  PLT 265 282 260 252

## 2021-11-26 NOTE — Progress Notes (Signed)
HD#3 SUBJECTIVE:  Patient Summary: [name] [name] is a 71 y.o. with a pertinent PMH of DM2 and CKD stage 5, who presented with LOC and admitted for syncope.   Overnight Events: no acute events overnight    Interm History: Patient seen and evaluated at bedside. She appears drowsy. Will open eyes and respond briefly to questions. This is change from yesterday where she was awake and conversant. She also reports new arm pain. Secure chat message by nurse later in the morning regarding patients worsening appearance. Patient in bed,breathing from her mouth with eyes closed. She was similarly drowsy. She would wake up briefly to physical and verbal stimuli before dozing again. Having IV pulled from arm did not improve her pain.   OBJECTIVE:  Vital Signs: Vitals:   11/26/21 0315 11/26/21 0812 11/26/21 0927 11/26/21 1025  BP: (!) 150/81 127/66 (!) 177/129 (!) 130/105  Pulse: 61 87 (!) 113   Resp: 20 19    Temp: 98.6 F (37 C) 97.7 F (36.5 C)    TempSrc: Oral     SpO2: 96%     Weight:      Height:       Constitutional: Well-developed, well-nourished, drowsy Neck: Normal range of motion.  Cardiovascular: Normal rate, regular rhythm, intact distal pulses. No gallop and no friction rub. No murmur heard. ropey mass in right antecubital foss Pulmonary: Breathing appears more labored, crackles heard bilateral lung bases satting 95% RA Abdominal: Soft.  +bowel sounds. Non distended. No tenderness, nonperitonitic Musculoskeletal: Normal range of motion. Tender lower extremities, right more edematous than left, nonpitting Neurological: drowsy, but awakens to verbal and tactile stimuli. Does not appear to be confused at this time and responds appropriately.  Skin: wound left shin. Edematous, indurated, and erythema of left antecubital fossa. Ropey tender mass palpable in left AC fossa.    Pertinent Labs: CBC Latest Ref Rng & Units 11/26/2021 11/25/2021 11/23/2021  WBC 4.0 - 10.5 K/uL 21.0(H)  8.7 9.9  Hemoglobin 12.0 - 15.0 g/dL 8.8(L) 7.1(L) 7.4(L)  Hematocrit 36.0 - 46.0 % 28.8(L) 23.6(L) 24.1(L)  Platelets 150 - 400 K/uL 282 252 260    CMP Latest Ref Rng & Units 11/26/2021 11/25/2021 11/24/2021  Glucose 70 - 99 mg/dL 240(H) 154(H) 125(H)  BUN 8 - 23 mg/dL 157(H) 169(H) 171(H)  Creatinine 0.44 - 1.00 mg/dL 2.94(H) 2.98(H) 3.03(H)  Sodium 135 - 145 mmol/L 139 141 139  Potassium 3.5 - 5.1 mmol/L 4.1 4.0 3.7  Chloride 98 - 111 mmol/L 98 98 98  CO2 22 - 32 mmol/L 27 30 28   Calcium 8.9 - 10.3 mg/dL 10.1 10.0 9.5  Total Protein 6.5 - 8.1 g/dL - 6.3(L) -  Total Bilirubin 0.3 - 1.2 mg/dL - 0.2(L) -  Alkaline Phos 38 - 126 U/L - 45 -  AST 15 - 41 U/L - 18 -  ALT 0 - 44 U/L - 9 -     ASSESSMENT/PLAN:   Assessment:  #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 Patient with persistently elevated serum creatinine as well as BUN. She is still able to produce urine.  CKD is managed by nephrology outpatient and they are currently recommending dialysis however she has declined HD in the past. She has had left first stage basilic vein transposition performed 03/2020, but did not follow through with dialysis. She states that her reason for not wanting to pursue HD is that she is afraid of what it means. Her sister, [name], is a source  of support for her, however, attempts to contact her thus far have been unsuccessful.  She has symptomatic uremia contributing to syncope and declining functional status at home. Patient had expressed willingness to discuss HD with nephrology yesterday, however, upon their meeting with the patient, she again refused HD. Have had several discussion with patient at this point that her renal disease is driving many of her current symptoms and complaints, and that without dialysis, we are not able to fix her underlying disease. Palliative care has been consulted to assist with determining her desires and goals of care. Will need to have ongoing goals of care  discussions with patient to determine her desires. She emphasized to the team today that she strongly does not want dialysis at this point, but is willing to have it in emergent situations. Although, dialysis would be definitive treatment, we will continue to manage her symptoms and problems as they arise.  - - Paged by RN due to concern for worsening clincial status. Patient less responsive compared to yesterday and complaining of arm pain. Crackles heard in bilateral lung bases. Given hx of CHF and CKD V, concern for volume overload despite improvements in renal function labs. CXR was obtained which demonstrated pulmonary edema. Fortunately, she has been satting 95% on RA. She will receive a dose of lasix today.  - Avoid nephrotoxic drugs and dose medications per GFR. She has neuropathic pain in her legs and can be given small doses 100mg  Gabapentin as needed for a maximum of 300mg  daily. For more severe pain, dilaudid can be given.  - she is receiving Sevelamer for her hyperphosphatemia  - Many of patient's current complaints are due to underlying CKD which cannot be fixed in the hospital without dialysis. Once her lab work has stabilized and she is able to [facility] appropriately, she can be discharged.   #Chronic HFmrEF Patient's echo 02/2021.  EF noted to be 45 to 50% at that time.  There was no wall motion abnormalities.  She was noted to have left ventricular hypertrophy, no valvulopathy, left atrium was noted to be severely dilated. ECHO shows mild worsening of EF to 40-45% in setting of worsening uremia.  Patient is on torsemide milligrams twice daily per EMR as well as metolazone 3 times a week. She is receiving IV diuresis and required additional dose today due to concern for pulmonary edema Home diuretic regimen can be resumed on discharge.    IV infiltration vs superficial thrombosis vs cellulitis of left AC fossa Her right AC IV had been pulled earlier due to pain, edema, erythema, and  induration noted over AC fossa. Will evaluate with [name] and treat with warm compresses. Cannot r/o cellulitis at this time. She has been started on abx for this. IV placed in distal forearm by IV team for administration of additional abx.   #Syncope Likely multifactorial etiologies including vasovagal from pain, situational from defecation, uremia and anemia. No syncopal events observed during hospitalization.   #Constipation Generalized abdominal pain. Imaging revealed large stool burden. - miralax and senna daily    #T2DM Patient's last A1c was 02/2021 and noted to be 6.8.  Per EMR she takes 75/25 insulin 40 units in the a.m. and 30 units in the evening as well as Trulicity 1.5 mg weekly.  Patient is also on Reglan with 3 times daily dosing. -We will start patient on sliding scale insulin and continue her home Reglan.  Will uptitrate as necessary.  #Renal mass  Patient noted to have interval  enlargement of L kidney exophytic density (now 3.2 cm) compared to 02/2021 imaging - patient underwent MRI imaging of this mass. 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    #LLL nodules Patient with left lower lobe nodules noted on previous imaging.  However, on CT obtained this hospitalization there noted to be larger, with the largest measuring 2.4 cm.  At risk for malignancy or metastatic disease and an outpatient PET scan is recommended for further evaluation. Will plan to arrange for this after discharge.   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 Patient was noted to be significantly iron deficient on labs 09/2021 with a ferritin of 11 and a transferrin saturation of 7%.  She did receive IV iron in the past for this most recently, Feraheme 10/04/2021. - she has been  started on IV iron. Will plan to follow daily CBC  Signature: Delene Ruffini, MD  Internal Medicine Resident, PGY-1 [facility]  Pager: [phone] 12:43 PM, 11/26/2021   Please contact the on call pager after 5 pm and on weekends at [phone].

## 2021-11-26 NOTE — Progress Notes (Addendum)
Patient evaluated at bedside.  Per nursing staff they spoke with the patient's family who encouraged the patient to have dialysis.  This information was relayed to the patient who then voiced that she wanted dialysis.  This is all per the nursing staff.  At bedside patient is alert and oriented to self situation but not time.  She endorses being in the hospital because she was vomiting and not feeling well.  We discussed dialysis and patient says that she does not want dialysis because she is scared.  During my time in the room there was no mention of her voicing that she wanted to start dialysis.  Complicated situation but the team will continue to have ongoing discussions with the patient about her goals of care.

## 2021-11-26 NOTE — Progress Notes (Signed)
Paged to patient's room regarding further goals of care conversations.  The patient's spouse, [name] was present at bedside.  The patient was alert and oriented and responding appropriately, although did have significant labored breathing.  She verified that it is okay to discuss personal medical information in front of [name].  We had a long conversation that dialysis in regards to her current medical situation.  We emphasized the risks and benefits of dialysis including the potential for death if dialysis is not performed in the near future.  We gust the patient's symptoms of uremia and shortness of breath in the context of her kidney failure.  The patient and her spouse admitted to understanding of this conversation.  Patient agreed to pursue dialysis.  Patient's husband verbalized his encouragement that the patient should make her own decision regarding this matter.  The patient reaffirmed that she would like to pursue dialysis at this time.  I will reach out to the on-call nephrology team so they are aware of this change and her care.   Lawerance Cruel, D.O.  Internal Medicine Resident, PGY-3 [facility]  Pager: [phone] 8:24 PM, 11/26/2021   **Please contact the on call pager after 5 pm and on weekends at [phone].**

## 2021-11-27 ENCOUNTER — Encounter (HOSPITAL_BASED_OUTPATIENT_CLINIC_OR_DEPARTMENT_OTHER): Payer: Medicare Other | Admitting: Internal Medicine

## 2021-11-27 ENCOUNTER — Inpatient Hospital Stay (HOSPITAL_COMMUNITY): Payer: Medicare Other

## 2021-11-27 DIAGNOSIS — N186 End stage renal disease: Secondary | ICD-10-CM | POA: Diagnosis not present

## 2021-11-27 DIAGNOSIS — N185 Chronic kidney disease, stage 5: Secondary | ICD-10-CM | POA: Diagnosis not present

## 2021-11-27 DIAGNOSIS — T82590A Other mechanical complication of surgically created arteriovenous fistula, initial encounter: Secondary | ICD-10-CM | POA: Diagnosis not present

## 2021-11-27 DIAGNOSIS — E119 Type 2 diabetes mellitus without complications: Secondary | ICD-10-CM | POA: Diagnosis not present

## 2021-11-27 DIAGNOSIS — J9601 Acute respiratory failure with hypoxia: Secondary | ICD-10-CM | POA: Diagnosis not present

## 2021-11-27 DIAGNOSIS — R55 Syncope and collapse: Secondary | ICD-10-CM | POA: Diagnosis not present

## 2021-11-27 LAB — CBC
HCT: 28.9 % — ABNORMAL LOW (ref 36.0–46.0)
Hemoglobin: 8.9 g/dL — ABNORMAL LOW (ref 12.0–15.0)
MCH: 26.9 pg (ref 26.0–34.0)
MCHC: 30.8 g/dL (ref 30.0–36.0)
MCV: 87.3 fL (ref 80.0–100.0)
Platelets: 263 10*3/uL (ref 150–400)
RBC: 3.31 MIL/uL — ABNORMAL LOW (ref 3.87–5.11)
RDW: 22.2 % — ABNORMAL HIGH (ref 11.5–15.5)
WBC: 19.8 10*3/uL — ABNORMAL HIGH (ref 4.0–10.5)
nRBC: 0 % (ref 0.0–0.2)

## 2021-11-27 LAB — URINALYSIS, ROUTINE W REFLEX MICROSCOPIC
Bilirubin Urine: NEGATIVE
Glucose, UA: NEGATIVE mg/dL
Hgb urine dipstick: NEGATIVE
Ketones, ur: NEGATIVE mg/dL
Leukocytes,Ua: NEGATIVE
Nitrite: NEGATIVE
Protein, ur: 30 mg/dL — AB
Specific Gravity, Urine: 1.012 (ref 1.005–1.030)
pH: 5 (ref 5.0–8.0)

## 2021-11-27 LAB — COMPREHENSIVE METABOLIC PANEL
ALT: 13 U/L (ref 0–44)
AST: 18 U/L (ref 15–41)
Albumin: 3.7 g/dL (ref 3.5–5.0)
Alkaline Phosphatase: 50 U/L (ref 38–126)
Anion gap: 13 (ref 5–15)
BUN: 146 mg/dL — ABNORMAL HIGH (ref 8–23)
CO2: 27 mmol/L (ref 22–32)
Calcium: 10.9 mg/dL — ABNORMAL HIGH (ref 8.9–10.3)
Chloride: 98 mmol/L (ref 98–111)
Creatinine, Ser: 2.75 mg/dL — ABNORMAL HIGH (ref 0.44–1.00)
GFR, Estimated: 18 mL/min — ABNORMAL LOW (ref >60)
Glucose, Bld: 191 mg/dL — ABNORMAL HIGH (ref 70–99)
Potassium: 3.8 mmol/L (ref 3.5–5.1)
Sodium: 138 mmol/L (ref 135–145)
Total Bilirubin: 0.8 mg/dL (ref 0.3–1.2)
Total Protein: 6.7 g/dL (ref 6.5–8.1)

## 2021-11-27 LAB — GLUCOSE, CAPILLARY
Glucose-Capillary: 173 mg/dL — ABNORMAL HIGH (ref 70–99)
Glucose-Capillary: 201 mg/dL — ABNORMAL HIGH (ref 70–99)
Glucose-Capillary: 252 mg/dL — ABNORMAL HIGH (ref 70–99)
Glucose-Capillary: 197 mg/dL — ABNORMAL HIGH (ref 70–99)

## 2021-11-27 LAB — PROTEIN / CREATININE RATIO, URINE
Creatinine, Urine: 75.64 mg/dL
Protein Creatinine Ratio: 0.45 mg/mg{Cre} — ABNORMAL HIGH (ref 0.00–0.15)
Total Protein, Urine: 34 mg/dL

## 2021-11-27 LAB — SODIUM, URINE, RANDOM: Sodium, Ur: 14 mmol/L

## 2021-11-27 LAB — CREATININE, URINE, RANDOM: Creatinine, Urine: 75.44 mg/dL

## 2021-11-27 MED ORDER — IPRATROPIUM-ALBUTEROL 0.5-2.5 (3) MG/3ML IN SOLN: 3.0000 mL | Freq: Four times a day (QID) | RESPIRATORY_TRACT | Status: DC | PRN

## 2021-11-27 MED ORDER — FUROSEMIDE 10 MG/ML IJ SOLN
80.0000 mg | Freq: Once | INTRAMUSCULAR | Status: AC
  Administered 2021-11-27: 80 mg via INTRAVENOUS
  Filled 2021-11-27: qty 8

## 2021-11-27 MED ORDER — FLUTICASONE FUROATE-VILANTEROL 200-25 MCG/ACT IN AEPB
1.0000 | INHALATION_SPRAY | Freq: Every day | RESPIRATORY_TRACT | Status: DC
  Administered 2021-11-27 – 2021-12-08 (×11): 1 via RESPIRATORY_TRACT
  Filled 2021-11-27: qty 28

## 2021-11-27 MED ORDER — PREDNISONE 20 MG PO TABS
20.0000 mg | ORAL_TABLET | Freq: Every day | ORAL | Status: AC
  Administered 2021-11-27 – 2021-11-30 (×4): 20 mg via ORAL
  Filled 2021-11-27 (×4): qty 1

## 2021-11-27 MED ORDER — FUROSEMIDE 40 MG PO TABS
40.0000 mg | ORAL_TABLET | Freq: Once | ORAL | Status: AC
  Administered 2021-11-27: 40 mg via ORAL
  Filled 2021-11-27: qty 1

## 2021-11-27 MED ORDER — IPRATROPIUM-ALBUTEROL 0.5-2.5 (3) MG/3ML IN SOLN
3.0000 mL | RESPIRATORY_TRACT | Status: DC
  Administered 2021-11-27: 3 mL via RESPIRATORY_TRACT
  Filled 2021-11-27: qty 3

## 2021-11-27 NOTE — Progress Notes (Signed)
Patient ID: [name] [name], female   DOB: 04-01-1951, 71 y.o.   MRN: [id number] S:Discussion regarding dialysis noted from last night.  She is has some tachypnea but sats are good.  No n/v, more awake and alert this morning.  O:BP (!) 143/64 (BP Location: Right Arm)    Pulse (!) 109    Temp 98.7 F (37.1 C) (Oral)    Resp 20    Ht 5\' 3"  (1.6 m)    Wt 93.3 kg    SpO2 98%    BMI 36.44 kg/m   Intake/Output Summary (Last 24 hours) at 11/27/2021 0943 Last data filed at 11/27/2021 0510 Gross per 24 hour  Intake 344.41 ml  Output 1100 ml  Net -755.59 ml   Intake/Output: I/O last 3 completed shifts: In: 644.4 [P.O.:550; IV Piggyback:94.4] Out: 1600 [Urine:1600]  Intake/Output this shift:  No intake/output data recorded. Weight change:  Gen: increased work of breathing sitting upright in bed but NAD XQJ:JHERD at 109, no rub Resp: CTA Abd: +BS, soft NT/ND Ext: trace bilateral lower extremity edema Neuro:  slowed mentation, but oriented, no asterixis  Recent Labs  Lab 11/21/21 1413 11/22/21 0259 11/22/21 0300 11/23/21 0130 11/24/21 0738 11/25/21 0110 11/26/21 0122 11/27/21 0148  NA 138 141  --  137 139 141 139 138  K 3.0* 3.3*  --  3.4* 3.7 4.0 4.1 3.8  CL 97* 98  --  96* 98 98 98 98  CO2 25 27  --  27 28 30 27 27   GLUCOSE 199* 100*  --  199* 125* 154* 240* 191*  BUN 191* 186*  --  189* 171* 169* 157* 146*  CREATININE 3.12* 3.03*  --  3.42* 3.03* 2.98* 2.94* 2.75*  ALBUMIN 3.0*  --   --  2.5* 3.1* 3.6  --  3.7  CALCIUM 9.3 9.8  --  9.2 9.5 10.0 10.1 10.9*  PHOS  --   --  6.1*  --  5.0* 4.3  --   --   AST 20  --   --  18  --  18  --  18  ALT 15  --   --  12  --  9  --  13   Liver Function Tests: Recent Labs  Lab 11/23/21 0130 11/24/21 0738 11/25/21 0110 11/27/21 0148  AST 18  --  18 18  ALT 12  --  9 13  ALKPHOS 50  --  45 50  BILITOT 0.3  --  0.2* 0.8  PROT 5.5*  --  6.3* 6.7  ALBUMIN 2.5* 3.1* 3.6 3.7   Recent Labs  Lab 11/21/21 1413  LIPASE 65*   No results  for input(s): AMMONIA in the last 168 hours. CBC: Recent Labs  Lab 11/21/21 1413 11/22/21 0259 11/23/21 0130 11/25/21 0110 11/26/21 1147 11/27/21 0148  WBC 11.0* 18.7* 9.9 8.7 21.0* 19.8*  NEUTROABS 9.3*  --   --   --  19.4*  --   HGB 7.9* 8.4* 7.4* 7.1* 8.8* 8.9*  HCT 26.6* 27.6* 24.1* 23.6* 28.8* 28.9*  MCV 89.9 88.5 87.3 87.4 87.5 87.3  PLT 265 282 260 252 282 263   Cardiac Enzymes: No results for input(s): CKTOTAL, CKMB, CKMBINDEX, TROPONINI in the last 168 hours. CBG: Recent Labs  Lab 11/26/21 0810 11/26/21 1145 11/26/21 1617 11/26/21 2115 11/27/21 0932  GLUCAP 160* 141* 167* 193* 173*    Iron Studies: No results for input(s): IRON, TIBC, TRANSFERRIN, FERRITIN in the last 72 hours. Studies/Results: DG CHEST  PORT 1 VIEW  Result Date: 11/26/2021 CLINICAL DATA:  Syncope with labored breathing. EXAM: PORTABLE CHEST 1 VIEW COMPARISON:  11/21/2021 FINDINGS: 1104 hours. The cardio pericardial silhouette is enlarged. There is pulmonary vascular congestion without overt pulmonary edema. Diffuse interstitial opacity suggest edema. Bones are diffusely demineralized. Telemetry leads overlie the chest. IMPRESSION: Cardiomegaly with interstitial pulmonary edema pattern. Electronically Signed   By: Misty Stanley M.D.   On: 11/26/2021 12:11   VAS [name] UPPER EXTREMITY VENOUS DUPLEX  Result Date: 11/26/2021 UPPER VENOUS STUDY  Patient Name:  TRIS HOWELL  Date of Exam:   11/26/2021 Medical Rec #: [id number]          Accession #:    [id number] Date of Birth: 1951/01/14           Patient Gender: F Patient Age:   59 years Exam Location:  [facility] Procedure:      VAS [name] UPPER EXTREMITY VENOUS DUPLEX Referring Phys: Velna Ochs --------------------------------------------------------------------------------  Indications: Pain, and Edema Comparison Study: No prior study Performing Technologist: Maudry Mayhew MHA, RDMS, RVT, RDCS  Examination Guidelines: A complete evaluation  includes B-mode imaging, spectral Doppler, color Doppler, and power Doppler as needed of all accessible portions of each vessel. Bilateral testing is considered an integral part of a complete examination. Limited examinations for reoccurring indications may be performed as noted.  Right Findings: +----------+------------+---------+-----------+----------+-----------------+  RIGHT      Compressible Phasicity Spontaneous Properties      Summary       +----------+------------+---------+-----------+----------+-----------------+  IJV            Full        Yes        Yes                                   +----------+------------+---------+-----------+----------+-----------------+  Subclavian     Full        Yes        Yes                                   +----------+------------+---------+-----------+----------+-----------------+  Axillary       Full        Yes        Yes                                   +----------+------------+---------+-----------+----------+-----------------+  Brachial       Full        Yes        Yes                                   +----------+------------+---------+-----------+----------+-----------------+  Radial         Full                                                         +----------+------------+---------+-----------+----------+-----------------+  Ulnar          None  No                 Age Indeterminate  +----------+------------+---------+-----------+----------+-----------------+  Cephalic       None                                            Acute        +----------+------------+---------+-----------+----------+-----------------+  Basilic        Full                                                         +----------+------------+---------+-----------+----------+-----------------+  Left Findings: +----------+------------+---------+-----------+----------+-------+  LEFT       Compressible Phasicity Spontaneous Properties Summary   +----------+------------+---------+-----------+----------+-------+  Subclavian                 Yes        Yes                         +----------+------------+---------+-----------+----------+-------+  Summary:  Right: Findings consistent with acute superficial vein thrombosis involving the right cephalic vein. Findings consistent with age indeterminate deep vein thrombosis involving the right ulnar veins.  Left: No evidence of thrombosis in the subclavian.  *See table(s) above for measurements and observations.    Preliminary     amoxicillin-clavulanate  1 tablet Oral BID   carvedilol  12.5 mg Oral BID   fluticasone furoate-vilanterol  1 puff Inhalation Daily   heparin  5,000 Units Subcutaneous Q8H   hydrALAZINE  50 mg Oral Q8H   HYDROmorphone  2 mg Oral Once   insulin aspart  0-9 Units Subcutaneous TID WC   insulin glargine-yfgn  10 Units Subcutaneous Daily   ipratropium-albuterol  3 mL Nebulization Q4H   levothyroxine  200 mcg Oral Q0600   metoCLOPramide  5 mg Oral TID   pantoprazole  40 mg Oral QHS   sevelamer carbonate  800 mg Oral TID WC    BMET    Component Value Date/Time   NA 138 11/27/2021 0148   NA 146 (H) 12/23/2013 1132   K 3.8 11/27/2021 0148   CL 98 11/27/2021 0148   CO2 27 11/27/2021 0148   GLUCOSE 191 (H) 11/27/2021 0148   BUN 146 (H) 11/27/2021 0148   BUN 31 (H) 12/23/2013 1132   CREATININE 2.75 (H) 11/27/2021 0148   CREATININE 3.51 (HH) 10/17/2020 1234   CREATININE 1.32 (H) 06/29/2014 1100   CALCIUM 10.9 (H) 11/27/2021 0148   CALCIUM 9.1 09/07/2021 1036   GFRNONAA 18 (L) 11/27/2021 0148   GFRNONAA 13 (L) 10/17/2020 1234   GFRAA 15 (L) 04/22/2020 1129   CBC    Component Value Date/Time   WBC 19.8 (H) 11/27/2021 0148   RBC 3.31 (L) 11/27/2021 0148   HGB 8.9 (L) 11/27/2021 0148   HCT 28.9 (L) 11/27/2021 0148   HCT 24.6 (L) 10/17/2020 1235   PLT 263 11/27/2021 0148   MCV 87.3 11/27/2021 0148   MCH 26.9 11/27/2021 0148   MCHC 30.8 11/27/2021 0148   RDW  22.2 (H) 11/27/2021 0148   RDW 15.7 (H) 12/23/2013 1132   LYMPHSABS 0.5 (L) 11/26/2021 1147   LYMPHSABS 2.5 12/23/2013 1132   MONOABS 0.9  11/26/2021 1147   EOSABS 0.1 11/26/2021 1147   EOSABS 0.1 12/23/2013 1132   BASOSABS 0.0 11/26/2021 1147   BASOSABS 0.0 12/23/2013 1132     Assessment/Plan:   Non-Oliguric AKI/azotemia: Creatinine is back to baseline and BUN improving daily.  However, BUN fairly high.  Unclear what caused BUN elevation to be so high out of proportion to creatinine.  She has not demonstrated signs of uremia thus far although had been confused over the weekend. I suspect this is delirium and not uremia given the BUN has continued to trend down  Continue to hold diuretics  Patient has been somewhat hesitant to move forward with dialysis in the past but now agrees if needed.  Fortunately does not need today.   Palliative care involved. Patient may be leaving soon which is permissible with close nephrology follow up.  Continue to monitor daily Cr, Dose meds for GFR  Monitor Daily I/Os, Daily weight  Maintain MAP>65 for optimal renal perfusion. Avoid nephrotoxic medications including NSAIDs, IV Contrast and phosphate containing bowel preps (FLEETS)  Use synthetic opioids (Fentanyl/Dilaudid) if needed Congestive heart failure: EF around 40 to 31% with diastolic dysfunction.  Lower extremity edema persistent but also may be multifactorial.  Holding diuresis  Vascular access: Status post first stage basilic vein transposition in June 2021.  May need to move forward with more permanent access based on progress and conversations Hyperphosphatemia: Sevelamer  Hypercalemia - not on vitamin D or calcium supplements, likely due to immobility.  Ambulate as tolerated Anemia of CKD: Iron deficiency.  IV iron ordered.  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  Follow-up 3 to 6 months. HTN: Coreg 12.5 mg twice daily.  Blood pressure goal Right  lower extremity edema: Predominantly on the right side.  Had PVL of the right leg on 2/15 that was negative for DVT.  Donetta Potts, MD [employer] [phone]

## 2021-11-27 NOTE — Progress Notes (Signed)
PT Cancellation Note  Patient Details Name: [name] [name] MRN: [id number] DOB: 1950/12/25   Cancelled Treatment:    Reason Eval/Treat Not Completed: Fatigue/lethargy limiting ability to participate.  Gone for procedure and now too sleepy/lethargic to participate.  Follow up at another time as pt can tolerate.   Ramond Dial 11/27/2021, 3:39 PM  Mee Hives, PT PhD Acute Rehab Dept. Number: [place]

## 2021-11-27 NOTE — Progress Notes (Signed)
OT Cancellation Note  Patient Details Name: [name] [name] MRN: [id number] DOB: 1951/03/14   Cancelled Treatment:    Reason Eval/Treat Not Completed: Fatigue/lethargy limiting ability to participate (Pt lethargic upon arrival due to fatigue after procedure, will reattempt as schedule allows.)  Lynnda Child, OTD, OTR/L Acute Rehab [phone] - [place] 11/27/2021, 3:21 PM

## 2021-11-27 NOTE — Progress Notes (Signed)
Mobility Specialist Progress Note:   11/27/21 1300  Mobility  Activity Transferred from chair to bed  Level of Assistance Moderate assist, patient does 50-74%  Assistive Device None  Distance Ambulated (ft) 2 ft  Activity Response Tolerated fair  $Mobility charge 1 Mobility   Pt needing to go back to bed for transport. Left in bed with call bell in reach and all needs met.   [facility] Public librarian Phone [phone]

## 2021-11-27 NOTE — Progress Notes (Signed)
Mobility Specialist Progress Note:   11/27/21 1000  Mobility  Bed Position Chair  Activity Transferred from bed to chair  Level of Assistance Minimal assist, patient does 75% or more  Assistive Device Front wheel walker  Distance Ambulated (ft) 2 ft  Activity Response Tolerated fair  $Mobility charge 1 Mobility   Pt received in bed willing to participate in mobility. Left in chair with call bell in reach and all needs met.   [facility] Public librarian Phone [phone]

## 2021-11-27 NOTE — Progress Notes (Signed)
Upper extremity vein mapping LT and dialysis access duplex study completed.   Please see CV Proc for preliminary results.   Darlin Coco, RDMS, RVT

## 2021-11-27 NOTE — Care Management Important Message (Signed)
Important Message  Patient Details  Name: [name] [name] MRN: [id number] Date of Birth: 1951/01/18   Medicare Important Message Given:  Yes     Hannah Beat 11/27/2021, 1:09 PM

## 2021-11-27 NOTE — Progress Notes (Addendum)
HD#4 SUBJECTIVE:  Patient Summary: [name] [name] is a 71 y.o. with a pertinent PMH of DM2 and CKD stage 5, who presented with LOC and admitted for syncope.   Overnight Events: no acute events overnight    Interm History: Patient seen and evaluated at bedside. Appears more awake today. Still having pain in arm. Had extensive discussion with family at bedside. Discussed with patient and family that although she does not need HD right now, and may not even require it this hospitalization, that it is something she will need in the future. Discussed with family that although BUN and Cr are showing some improvement, overall unlikely to have permanent return of function and that she would likely continue to have symptoms including syncope and SOB. Explained to family that potential options included initiating HD as inpatient vs outpatient f/u bu t that this decision would ultimately need to be made by nephrology.   OBJECTIVE:  Vital Signs: Vitals:   11/26/21 1130 11/26/21 2053 11/27/21 0316 11/27/21 0755  BP: (!) 180/115 (!) 139/124 99/87 (!) 143/64  Pulse: (!) 105 (!) 107 71 (!) 109  Resp:  20 20 20   Temp:  99.4 F (37.4 C) 99.4 F (37.4 C) 98.7 F (37.1 C)  TempSrc:  Oral Oral Oral  SpO2:  95% 93% 98%  Weight:      Height:       Constitutional: Well-developed, well-nourished,  alert Neck: Normal range of motion.  Cardiovascular: Normal rate, regular rhythm, intact distal pulses. No gallop and no friction rub. No murmur heard. ropey mass in right antecubital foss Pulmonary: Breathing appears more labored, crackles heard bilateral lung bases satting 95% RA Abdominal: Soft.  +bowel sounds. Non distended. No tenderness, nonperitonitic Musculoskeletal: Normal range of motion. Tender lower extremities, right more edematous than left, nonpitting Neurological: drowsy, but awakens to verbal and tactile stimuli. Does not appear to be confused at this time and responds appropriately.  Skin:  wound left shin. Edematous, indurated, and erythema of left antecubital fossa. Ropey tender mass palpable in left AC fossa.    Pertinent Labs: CBC Latest Ref Rng & Units 11/27/2021 11/26/2021 11/25/2021  WBC 4.0 - 10.5 K/uL 19.8(H) 21.0(H) 8.7  Hemoglobin 12.0 - 15.0 g/dL 8.9(L) 8.8(L) 7.1(L)  Hematocrit 36.0 - 46.0 % 28.9(L) 28.8(L) 23.6(L)  Platelets 150 - 400 K/uL 263 282 252    CMP Latest Ref Rng & Units 11/27/2021 11/26/2021 11/25/2021  Glucose 70 - 99 mg/dL 191(H) 240(H) 154(H)  BUN 8 - 23 mg/dL 146(H) 157(H) 169(H)  Creatinine 0.44 - 1.00 mg/dL 2.75(H) 2.94(H) 2.98(H)  Sodium 135 - 145 mmol/L 138 139 141  Potassium 3.5 - 5.1 mmol/L 3.8 4.1 4.0  Chloride 98 - 111 mmol/L 98 98 98  CO2 22 - 32 mmol/L 27 27 30   Calcium 8.9 - 10.3 mg/dL 10.9(H) 10.1 10.0  Total Protein 6.5 - 8.1 g/dL 6.7 - 6.3(L)  Total Bilirubin 0.3 - 1.2 mg/dL 0.8 - 0.2(L)  Alkaline Phos 38 - 126 U/L 50 - 45  AST 15 - 41 U/L 18 - 18  ALT 0 - 44 U/L 13 - 9     ASSESSMENT/PLAN:   Assessment:  #CKD stage 5 approaching ESRD with evidence of uremia and volume overload. - after lengthy [place] discussions, patient is agreeable to dialysis. Nephro and VVS have both been consulted. No indication for urgent dialysis at this time. Plan to continue with IV lasix for now. Plan for likely discharge once stable with initiation of  outpatient dialysis.  - Appreciate VVS assistance; Hx previous first stage basilic vein fistula; concern for thrombosis. Vascular duplex has been ordered.  - Avoid nephrotoxic drugs and dose medications per GFR. She has neuropathic pain in her legs and can be given small doses 100mg  Gabapentin as needed for a maximum of 300mg  daily. For more severe pain, dilaudid can be given.  - Continue Sevelamer for her hyperphosphatemia  - IV lasix 40 mg this morning; giving additional IV 80 mg this afternoon  - Strict I&Os, daily weights, telemetry   #Acute Hypoxic Respiratory Failure 2/2 pulmonary edema #Possible  COPD exacerbation  Patient has had worsening SOB despite diuresis with new oxygen requirement this morning. CXR yesterday showed diffuse interstitial pulmonary edema. On exam today, she is tachypneic with conversation and in moderate respiratory distress, using accessory muscles to breath. She has wheezing and poor air movement throughout. She uses Symbicort at home which she has not been getting here.  -- Continue diuresis above, wean oxygen as able -- Start breo (equivalent to home symbicort)  -- Scheduled duonebs q4h while awake  -- Pred 40x 5 days   #Acute on Chronic HFmrEF Patient's echo 02/2021.  EF noted to be 45 to 50% at that time.  There was no wall motion abnormalities.  She was noted to have left ventricular hypertrophy, no valvulopathy, left atrium was noted to be severely dilated. ECHO shows mild worsening of EF to 40-45% in setting of worsening uremia.  Patient is on torsemide milligrams twice daily per EMR as well as metolazone 3 times a week. She is receiving IV diuresis and required additional dose today due to concern for pulmonary edema Home diuretic regimen can be resumed on discharge.   - IV lasix 40 mg x 1 this morning; additional IV lasix 80 mg this evening  - Strict I&Os - Daily weights   IV infiltration vs superficial thrombosis vs cellulitis of left AC fossa - vascular US obtained showing superficial thrombus as well as DVT in ulnar vein. Superficial thrombus able to be managed with warm compresses. More distal DVT has low probability of embolizing and can be observed at this time.  Cannot exclude possibility of cellulitis given leukocytosis, though she has been afebrile which is more suggestive of reactive leukocytosis. Will continue on abx for superficial cellulitis at this time. Bcx collected and these will need to be negative effectively ruling out thrombophlebitis prior to proceeding with vascular access for HD.   #Syncope Likely multifactorial etiologies including  vasovagal from pain, situational from defecation, uremia and anemia. No syncopal events observed during hospitalization.   #Constipation Generalized abdominal pain. Imaging revealed large stool burden. - miralax and senna daily    #T2DM Patient's last A1c was 02/2021 and noted to be 6.8.  Per EMR she takes 75/25 insulin 40 units in the a.m. and 30 units in the evening as well as Trulicity 1.5 mg weekly.  Patient is also on Reglan with 3 times daily dosing. -We will start patient on sliding scale insulin and continue her home Reglan.  Will uptitrate as necessary.  #Renal mass  Patient noted to have interval enlargement of L kidney exophytic density (now 3.2 cm) compared to 02/2021 imaging - patient underwent MRI imaging of this mass. 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    #LLL nodules Patient with left lower lobe nodules noted on previous  imaging.  However, on CT obtained this hospitalization there noted to be larger, with the largest measuring 2.4 cm.  At risk for malignancy or metastatic disease and an outpatient PET scan is recommended for further evaluation. Will plan to arrange for this after discharge.   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 Patient was noted to be significantly iron deficient on labs 09/2021 with a ferritin of 11 and a transferrin saturation of 7%.  She did receive IV iron in the past for this most recently, Feraheme 10/04/2021. - she has been started on IV iron. Will plan to follow daily CBC  Signature: Delene Ruffini, MD  Internal Medicine Resident, PGY-1 [facility]  Pager: [phone] 1:59 PM, 11/27/2021   Please contact the on call pager after 5 pm and on weekends at [phone].

## 2021-11-27 NOTE — Consult Note (Signed)
Hospital Consult    Reason for Consult:  TDC/AVF access Referring Physician: Medicine MRN #:  [id number]  History of Present Illness: This is a 71 y.o. female with hx stage 5 CKD, DM, CHF that vascular surgery is consulted for possible [facility] and permanent access.  Patient was admitted with syncope.  She has a known history of stage V CKD and has not started dialysis at this time.  She is right-handed.  Nephrology notes today incidcate Cr and BUN continue to improve.  Patient previously had a left first stage basilic vein transposition on 04/05/2020 by Dr. Scot Dock.  Slow to mature left arm AV fistula last seen by Dr. Oneida Alar on 06/30/2020 and the vein remained small at that time.   Past Medical History:  Diagnosis Date   Anemia    Arthritis    Asthma    Chronic diastolic (congestive) heart failure (HCC)    Chronic kidney disease    Chronic pain syndrome    Class 2 obesity due to excess calories with body mass index (BMI) of 36.0 to 36.9 in adult    COPD (chronic obstructive pulmonary disease) (HCC)    Diabetes mellitus without complication (HCC)    Full dentures    GERD (gastroesophageal reflux disease)    Gout    History of hiatal hernia    History of transfusion    Hx MRSA infection    abscess left groin   Hyperlipidemia    Hypertension    Hypothyroid    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HCC)    Peripheral vascular disease (HCC)    Sleep apnea    UNABLE TO TOLERATE C PAP   Stress incontinence    TIA (transient ischemic attack)     X2 NO RESIDUAL PROBLEMS   Wears glasses     Past Surgical History:  Procedure Laterality Date   ABDOMINAL HYSTERECTOMY     BACK SURGERY  1324   BASCILIC VEIN TRANSPOSITION Left 04/05/2020   Procedure: BASILIC VEIN TRANSPOSITION FIRST STAGE;  Surgeon: Angelia Mould, MD;  Location: [facility];  Service: Vascular;  Laterality: Left;   COLON SURGERY  1995   DUE TO POLYP   DILATION AND CURETTAGE OF UTERUS     ENTEROSCOPY N/A 07/10/2021   Procedure:  ENTEROSCOPY;  Surgeon: Ronnette Juniper, MD;  Location: [facility] ENDOSCOPY;  Service: Gastroenterology;  Laterality: N/A;   ESOPHAGOGASTRODUODENOSCOPY N/A 01/25/2021   Procedure: ESOPHAGOGASTRODUODENOSCOPY (EGD);  Surgeon: Clarene Essex, MD;  Location: [facility];  Service: Endoscopy;  Laterality: N/A;   FOREARM FRACTURE SURGERY     Left arm   GIVENS CAPSULE STUDY N/A 07/07/2021   Procedure: GIVENS CAPSULE STUDY;  Surgeon: Clarene Essex, MD;  Location: [place];  Service: Endoscopy;  Laterality: N/A;   HERNIA REPAIR     w/ mesh   HOT HEMOSTASIS N/A 07/10/2021   Procedure: HOT HEMOSTASIS (ARGON PLASMA COAGULATION/BICAP);  Surgeon: Ronnette Juniper, MD;  Location: [place];  Service: Gastroenterology;  Laterality: N/A;   INCISION AND DRAINAGE ABSCESS Left 02/04/2013   Procedure: INCISION AND DRAINAGE LEFT BUTTOCK ABSCESS; INCISION AND DRAINAGE LEFT BREAST ABSCESS;  Surgeon: Harl Bowie, MD;  Location: [place];  Service: General;  Laterality: Left;   INCISION AND DRAINAGE ABSCESS N/A 02/12/2013   Procedure: INCISION AND DEBRIDEMENT BUTTOCK WOUND ;  Surgeon: [name]. Georgette Dover, MD;  Location: [place];  Service: General;  Laterality: N/A;   INCISION AND DRAINAGE ABSCESS N/A 02/14/2013   Procedure: INCISION AND DRAINAGE/DRESSING CHANGE;  Surgeon: Harl Bowie,  MD;  Location: [place];  Service: General;  Laterality: N/A;   INCISION AND DRAINAGE ABSCESS N/A 03/21/2015   Procedure: INCISION AND DRAINAGE PUBIC ABSCESS;  Surgeon: Excell Seltzer, MD;  [facility]: [facility];  Service: General;  Laterality: N/A;   INCISION AND DRAINAGE PERIRECTAL ABSCESS Left 02/10/2013   Procedure: IRRIGATION AND DEBRIDEMENT OF BUTTOCK/PERINEAL ABSCESS;  Surgeon: [name]. Georgette Dover, MD;  Location: [place];  Service: General;  Laterality: Left;   INCISION AND DRAINAGE PERIRECTAL ABSCESS N/A 02/16/2013   Procedure: IRRIGATION AND DEBRIDEMENT PERINEAL ABSCESS;  Surgeon: Zenovia Jarred, MD;  Location: [place];  Service: General;  Laterality: N/A;    IRRIGATION AND DEBRIDEMENT ABSCESS N/A 02/06/2013   Procedure: IRRIGATION AND DEBRIDEMENT BUTTOCK ABSCESS AND DRESSING CHANGE;  Surgeon: Harl Bowie, MD;  Location: [place];  Service: General;  Laterality: N/A;   IRRIGATION AND DEBRIDEMENT ABSCESS Left 02/08/2013   Procedure: IRRIGATION AND DEBRIDEMENT ABSCESS/DRESSING CHANGE;  Surgeon: Gwenyth Ober, MD;  Location: [place];  Service: General;  Laterality: Left;   JOINT REPLACEMENT  2010 / 2012    [place] N/A 07/02/2014   Procedure: LEFT HEART CATHETERIZATION WITH CORONARY ANGIOGRAM;  Surgeon: Lorretta Harp, MD;  Location: [facility] CATH LAB;  Service: Cardiovascular;  Laterality: N/A;   LOWER EXTREMITY ANGIOGRAM Right 04/20/2016   Procedure: Lower Extremity Angiogram;  Surgeon: Elam Dutch, MD;  Location: [place] CV LAB;  Service: Cardiovascular;  Laterality: Right;   MULTIPLE TOOTH EXTRACTIONS     PERIPHERAL VASCULAR CATHETERIZATION N/A 04/20/2016   Procedure: Abdominal Aortogram;  Surgeon: Elam Dutch, MD;  Location: [place] CV LAB;  Service: Cardiovascular;  Laterality: N/A;   PERIPHERAL VASCULAR CATHETERIZATION Right 04/20/2016   Procedure: Peripheral Vascular Intervention;  Surgeon: Elam Dutch, MD;  Location: [place] CV LAB;  Service: Cardiovascular;  Laterality: Right;  popiteal   RIGHT HEART CATH N/A 10/27/2018   Procedure: RIGHT HEART CATH;  Surgeon: Larey Dresser, MD;  Location: [place] CV LAB;  Service: Cardiovascular;  Laterality: N/A;   RIGHT HEART CATH N/A 03/20/2019   Procedure: RIGHT HEART CATH;  Surgeon: Larey Dresser, MD;  Location: [place] CV LAB;  Service: Cardiovascular;  Laterality: N/A;   THYROIDECTOMY     TOTAL HIP REVISION Right 08/11/2014   Procedure: RIGHT ACETABULAR REVISION;  Surgeon: Gearlean Alf, MD;  [facility];  Service: Orthopedics;  Laterality: Right;   VASCULAR SURGERY      Allergies   Allergen Reactions   Nebivolol Swelling    Chest pain (reaction to Bystolic)   Ace Inhibitors Swelling and Other (See Comments)    Tongue swell   Morphine And Related Itching    Prior to Admission medications   Medication Sig Start Date End Date Taking? Authorizing Provider  allopurinol (ZYLOPRIM) 100 MG tablet Take 1 tablet (100 mg total) by mouth daily. 03/05/21  Yes Medina-Vargas, Monina C, NP  B Complex Vitamins (B COMPLEX PO) Take 1 mL by mouth daily. B Complex 1.7mg -20mg -2mg -1.2mg /ml sublingual liquid   Yes [provider]  budesonide-formoterol (SYMBICORT) 160-4.5 MCG/ACT inhaler Inhale 2 puffs into the lungs 3 (three) times a week.   Yes [provider]  carvedilol (COREG) 12.5 MG tablet Take 1 tablet (12.5 mg total) by mouth 2 (two) times daily. 03/05/21  Yes Medina-Vargas, Monina C, NP  cinacalcet (SENSIPAR) 30 MG tablet Take 1 tablet (30 mg total) by mouth daily. 03/05/21  Yes Medina-Vargas, Monina C, NP  clopidogrel (PLAVIX) 75 MG tablet Take 75 mg by mouth daily.   Yes [provider]  diclofenac Sodium (VOLTAREN) 1 % GEL Apply 1 application topically 4 (four) times daily as needed (pain). 08/15/20  Yes [provider]  hydrOXYzine (ATARAX/VISTARIL) 25 MG tablet Take 1 tablet (25 mg total) by mouth 2 (two) times daily as needed for anxiety. Patient taking differently: Take 25 mg by mouth 2 (two) times daily as needed for itching. 03/05/21  Yes Medina-Vargas, Monina C, NP  insulin lispro protamine-lispro (HUMALOG 75/25 MIX) (75-25) 100 UNIT/ML SUSP injection Inject 60 Units into the skin in the morning and at bedtime.   Yes [provider]  levothyroxine (SYNTHROID) 200 MCG tablet Take 1 tablet (200 mcg total) by mouth daily at 6 (six) AM. 03/05/21  Yes Medina-Vargas, Monina C, NP  lubiprostone (AMITIZA) 24 MCG capsule Take 24 mcg by mouth 2 (two) times daily. 06/06/21  Yes [provider]  magnesium oxide (MAG-OX) 400 (241.3 Mg) MG  tablet Take 1 tablet (400 mg total) by mouth 2 (two) times daily. Patient taking differently: Take 400 mg by mouth daily. 01/14/20  Yes Lavina Hamman, MD  melatonin 3 MG TABS tablet Take 3 mg by mouth at bedtime.   Yes [provider]  metolazone (ZAROXOLYN) 5 MG tablet Take 1 tablet (5 mg total) by mouth 3 (three) times a week. Takes 5mg  on Monday, Wednesday and Friday Patient taking differently: Take 5 mg by mouth every Monday, Wednesday, and Friday. 03/06/21  Yes Medina-Vargas, Monina C, NP  mirabegron ER (MYRBETRIQ) 50 MG TB24 tablet Take 50 mg by mouth daily.   Yes [provider]  Multiple Vitamin (MULTIVITAMIN WITH MINERALS) TABS tablet Take 1 tablet by mouth daily.   Yes [provider]  oxyCODONE-acetaminophen (PERCOCET) 10-325 MG tablet Take 1 tablet by mouth 4 (four) times daily as needed for pain. 07/10/21  Yes Amin, Ankit Chirag, MD  pantoprazole (PROTONIX) 40 MG tablet Take 1 tablet (40 mg total) by mouth 2 (two) times daily before a meal. Patient taking differently: Take 40 mg by mouth daily. 07/10/21  Yes Amin, Jeanella Flattery, MD  Potassium Chloride ER 20 MEQ TBCR Take 1 tablet by mouth 3 (three) times a week. Tuesday, Thursdays and Saturdays Patient taking differently: Take 20 mEq by mouth See admin instructions. 61mEq daily on Tuesday's, Thursday's, and Saturday's 03/06/21  Yes Medina-Vargas, Monina C, NP  torsemide (DEMADEX) 100 MG tablet Take 1 tablet (100 mg total) by mouth 2 (two) times daily. 03/05/21  Yes Medina-Vargas, Monina C, NP  TRULICITY 3.00 PQ/3.3AQ SOPN Inject 0.75 mg into the skin once a week. 10/11/21  Yes [provider]  polyethylene glycol (MIRALAX / GLYCOLAX) 17 g packet Take 17 g by mouth daily. Patient not taking: Reported on 11/24/2021 07/10/21   Damita Lack, MD  senna-docusate (SENOKOT-S) 8.6-50 MG tablet Take 1 tablet by mouth at bedtime as needed for moderate constipation. Patient not taking: Reported on 11/24/2021 07/10/21    Damita Lack, MD  TRULICITY 1.5 TM/2.2QJ SOPN Inject 1.5 mg into the skin once a week. Monday Patient not taking: Reported on 11/24/2021 03/05/21   Medina-Vargas, Senaida Lange, NP    Social History   Socioeconomic History   Marital status: Legally Separated    Spouse name: Not on file   Number of children: 1   Years of education: college   Highest education level: Not on file  Occupational History  Comment: Disabled  Tobacco Use   Smoking status: Some Days    Packs/day: 0.25    Years: 40.00    Pack years: 10.00    Types: Cigarettes   Smokeless tobacco: Never  Vaping Use   Vaping Use: Never used  Substance and Sexual Activity   Alcohol use: Yes    Alcohol/week: 1.0 standard drink    Types: 1 Standard drinks or equivalent per week    Comment: OCC   Drug use: No   Sexual activity: Not Currently  Other Topics Concern   Not on file  Social History Narrative   ** Merged History Encounter **       Patient is separated  and lives at home with her friend. Patient is disabled. Education one year of college Right handed Caffeine two cups daily.     Social Determinants of Health   Financial Resource Strain: Not on file  Food Insecurity: Not on file  Transportation Needs: Not on file  Physical Activity: Not on file  Stress: Not on file  Social Connections: Not on file  Intimate Partner Violence: Not on file     Family History  Problem Relation Age of Onset   Diabetes Brother    Cardiomyopathy Mother    Heart disease Mother    Cancer Father     ROS: [x]  Positive   [ ]  Negative   [ ]  All sytems reviewed and are negative  Cardiovascular: []  chest pain/pressure []  palpitations []  SOB lying flat []  DOE []  pain in legs while walking []  pain in legs at rest []  pain in legs at night []  non-healing ulcers []  hx of DVT []  swelling in legs  Pulmonary: []  productive cough []  asthma/wheezing []  home O2  Neurologic: []  weakness in []  arms []  legs []  numbness  in []  arms []  legs []  hx of CVA []  mini stroke [] difficulty speaking or slurred speech []  temporary loss of vision in one eye []  dizziness  Hematologic: []  hx of cancer []  bleeding problems []  problems with blood clotting easily  Endocrine:   []  diabetes []  thyroid disease  GI []  vomiting blood []  blood in stool  GU: []  CKD/renal failure []  HD--[]  M/W/F or []  T/T/S []  burning with urination []  blood in urine  Psychiatric: []  anxiety []  depression  Musculoskeletal: []  arthritis []  joint pain  Integumentary: []  rashes []  ulcers  Constitutional: []  fever []  chills   Physical Examination  Vitals:   11/27/21 0316 11/27/21 0755  BP: 99/87 (!) 143/64  Pulse: 71 (!) 109  Resp: 20 20  Temp: 99.4 F (37.4 C) 98.7 F (37.1 C)  SpO2: 93% 98%   Body mass index is 36.44 kg/m.  General:  NAD Gait: Not observed HENT: WNL, normocephalic Pulmonary: wheezing Cardiac: regular, without  Murmurs, rubs or gallops Abdomen:  soft, NT/ND Vascular Exam/Pulses: Palpable radial and brachial pulses bilateral upper extremities Hard to appreciate any thrill left arm AV fistula Musculoskeletal: no muscle wasting or atrophy  Neurologic: A&O X 3; Appropriate Affect ; SENSATION: normal; MOTOR FUNCTION:  moving all extremities equally. Speech is fluent/normal  CBC    Component Value Date/Time   WBC 19.8 (H) 11/27/2021 0148   RBC 3.31 (L) 11/27/2021 0148   HGB 8.9 (L) 11/27/2021 0148   HCT 28.9 (L) 11/27/2021 0148   HCT 24.6 (L) 10/17/2020 1235   PLT 263 11/27/2021 0148   MCV 87.3 11/27/2021 0148   MCH 26.9 11/27/2021 0148   MCHC 30.8 11/27/2021 0148  RDW 22.2 (H) 11/27/2021 0148   RDW 15.7 (H) 12/23/2013 1132   LYMPHSABS 0.5 (L) 11/26/2021 1147   LYMPHSABS 2.5 12/23/2013 1132   MONOABS 0.9 11/26/2021 1147   EOSABS 0.1 11/26/2021 1147   EOSABS 0.1 12/23/2013 1132   BASOSABS 0.0 11/26/2021 1147   BASOSABS 0.0 12/23/2013 1132    BMET    Component Value Date/Time    NA 138 11/27/2021 0148   NA 146 (H) 12/23/2013 1132   K 3.8 11/27/2021 0148   CL 98 11/27/2021 0148   CO2 27 11/27/2021 0148   GLUCOSE 191 (H) 11/27/2021 0148   BUN 146 (H) 11/27/2021 0148   BUN 31 (H) 12/23/2013 1132   CREATININE 2.75 (H) 11/27/2021 0148   CREATININE 3.51 (HH) 10/17/2020 1234   CREATININE 1.32 (H) 06/29/2014 1100   CALCIUM 10.9 (H) 11/27/2021 0148   CALCIUM 9.1 09/07/2021 1036   GFRNONAA 18 (L) 11/27/2021 0148   GFRNONAA 13 (L) 10/17/2020 1234   GFRAA 15 (L) 04/22/2020 1129    COAGS: Lab Results  Component Value Date   INR 1.0 07/01/2021   INR 1.0 01/05/2021   INR 1.2 01/06/2020     Non-Invasive Vascular Imaging:    Left arm fistula duplex pending and left arm vein mapping pending   ASSESSMENT/PLAN: This is a 71 y.o. female admitted with syncope that has underlying stage V CKD that vascular surgery has been consulted for possible [facility] and permanent dialysis access placement.  I have discussed with Dr. Marval Regal with nephrology who thinks we should hold on [facility] placement given her creatinine and BUN are improving.  She does have a previous left arm first stage basilic vein fistula placed by Dr. Scot Dock.  This was small on the last evaluation by duplex.  I really cannot feel a thrill on exam and suspect it may be thrombosed.  I ordered a new left arm fistula duplex to evaluate and will also evaluate left arm cephalic vein.  Discussed with family may ultimately require new left upper extremity access versus AV graft.  Vascular will follow.  Marty Heck, MD [facility] Office: [place]

## 2021-11-27 NOTE — Progress Notes (Signed)
This chaplain attempted spiritual care visit consulted by PMT. The chaplain understands the Pt. is participating in patient care at the time of the visit. The chaplain will F/U at the appropriate time for the Pt. and chaplain.  Chaplain [name] [phone]

## 2021-11-27 NOTE — TOC Progression Note (Signed)
Transition of Care [facility]) - Progression Note    Patient Details  Name: [name] [name] MRN: [id number] Date of Birth: 1951-01-26  Transition of Care [facility]) CM/SW Contact  Emeterio Reeve, [place] Phone Number: 11/27/2021, 1:16 PM  Clinical Narrative:     CSW met with pt at bedside. Pts sister Lynn Ito and [name] was at bedside, and Pts husband by phone. Pt states she is going home with her sister Lynn Ito, address is [address] [place] [address].  Pt reports she has electric chair, toilet seat, walker and shower chair at home Pts sister is requesting a donut pillow, raised toilet seat, regular wheelchair.  Pts husband stated that they have an appointment at [facility] today for admission. Husband hopes [facility] will be able to increase home services.   Expected Discharge Plan: [place] Barriers to Discharge: Continued Medical Work up  Expected Discharge Plan and Services Expected Discharge Plan: [place]   Discharge Planning Services: CM Consult Post Acute Care Choice: [place] arrangements for the past 2 months: Apartment                   DME Agency: NA       HH Arranged: RN           Social Determinants of Health (SDOH) Interventions    Readmission Risk Interventions Readmission Risk Prevention Plan 01/14/2020  Transportation Screening Complete  Medication Review [employer]) Complete  PCP or Specialist appointment within 3-5 days of discharge Complete  HRI or Home Care Consult Complete  SW Recovery Care/Counseling Consult Complete  Palliative Care Screening Not [place] Complete  Some recent data might be hidden

## 2021-11-28 ENCOUNTER — Inpatient Hospital Stay (HOSPITAL_COMMUNITY): Payer: Medicare Other

## 2021-11-28 DIAGNOSIS — Z7189 Other specified counseling: Secondary | ICD-10-CM

## 2021-11-28 DIAGNOSIS — J441 Chronic obstructive pulmonary disease with (acute) exacerbation: Secondary | ICD-10-CM

## 2021-11-28 DIAGNOSIS — J9601 Acute respiratory failure with hypoxia: Secondary | ICD-10-CM | POA: Diagnosis not present

## 2021-11-28 DIAGNOSIS — N185 Chronic kidney disease, stage 5: Secondary | ICD-10-CM | POA: Diagnosis not present

## 2021-11-28 DIAGNOSIS — Z515 Encounter for palliative care: Secondary | ICD-10-CM | POA: Diagnosis not present

## 2021-11-28 DIAGNOSIS — J449 Chronic obstructive pulmonary disease, unspecified: Secondary | ICD-10-CM | POA: Diagnosis not present

## 2021-11-28 DIAGNOSIS — R55 Syncope and collapse: Secondary | ICD-10-CM | POA: Diagnosis not present

## 2021-11-28 LAB — RENAL FUNCTION PANEL
Albumin: 3.1 g/dL — ABNORMAL LOW (ref 3.5–5.0)
Anion gap: 11 (ref 5–15)
BUN: 147 mg/dL — ABNORMAL HIGH (ref 8–23)
CO2: 30 mmol/L (ref 22–32)
Calcium: 10.7 mg/dL — ABNORMAL HIGH (ref 8.9–10.3)
Chloride: 97 mmol/L — ABNORMAL LOW (ref 98–111)
Creatinine, Ser: 2.85 mg/dL — ABNORMAL HIGH (ref 0.44–1.00)
GFR, Estimated: 17 mL/min — ABNORMAL LOW (ref >60)
Glucose, Bld: 146 mg/dL — ABNORMAL HIGH (ref 70–99)
Phosphorus: 4.8 mg/dL — ABNORMAL HIGH (ref 2.5–4.6)
Potassium: 3.8 mmol/L (ref 3.5–5.1)
Sodium: 138 mmol/L (ref 135–145)

## 2021-11-28 LAB — CBC
HCT: 27.5 % — ABNORMAL LOW (ref 36.0–46.0)
Hemoglobin: 8.3 g/dL — ABNORMAL LOW (ref 12.0–15.0)
MCH: 27 pg (ref 26.0–34.0)
MCHC: 30.2 g/dL (ref 30.0–36.0)
MCV: 89.6 fL (ref 80.0–100.0)
Platelets: 228 10*3/uL (ref 150–400)
RBC: 3.07 MIL/uL — ABNORMAL LOW (ref 3.87–5.11)
RDW: 21.6 % — ABNORMAL HIGH (ref 11.5–15.5)
WBC: 12.5 10*3/uL — ABNORMAL HIGH (ref 4.0–10.5)
nRBC: 0.2 % (ref 0.0–0.2)

## 2021-11-28 LAB — MAGNESIUM: Magnesium: 1.5 mg/dL — ABNORMAL LOW (ref 1.7–2.4)

## 2021-11-28 LAB — TROPONIN I (HIGH SENSITIVITY): Troponin I (High Sensitivity): 81 ng/L — ABNORMAL HIGH (ref <18)

## 2021-11-28 LAB — GLUCOSE, CAPILLARY
Glucose-Capillary: 284 mg/dL — ABNORMAL HIGH (ref 70–99)
Glucose-Capillary: 192 mg/dL — ABNORMAL HIGH (ref 70–99)
Glucose-Capillary: 343 mg/dL — ABNORMAL HIGH (ref 70–99)
Glucose-Capillary: 287 mg/dL — ABNORMAL HIGH (ref 70–99)

## 2021-11-28 MED ORDER — IPRATROPIUM-ALBUTEROL 0.5-2.5 (3) MG/3ML IN SOLN
3.0000 mL | Freq: Three times a day (TID) | RESPIRATORY_TRACT | Status: AC
  Administered 2021-11-28 – 2021-11-29 (×6): 3 mL via RESPIRATORY_TRACT
  Filled 2021-11-28 (×5): qty 3

## 2021-11-28 MED ORDER — METOLAZONE 5 MG PO TABS: 5.0000 mg | ORAL_TABLET | ORAL | Status: DC

## 2021-11-28 MED ORDER — TORSEMIDE 100 MG PO TABS
100.0000 mg | ORAL_TABLET | Freq: Two times a day (BID) | ORAL | Status: DC
Start: 2021-11-28 — End: 2021-11-28
  Filled 2021-11-28: qty 1

## 2021-11-28 MED ORDER — TORSEMIDE 20 MG PO TABS
80.0000 mg | ORAL_TABLET | Freq: Two times a day (BID) | ORAL | Status: DC
  Filled 2021-11-28: qty 4

## 2021-11-28 MED ORDER — HYDROMORPHONE HCL 2 MG PO TABS
1.0000 mg | ORAL_TABLET | ORAL | Status: DC | PRN
  Administered 2021-11-28 – 2021-12-06 (×16): 1 mg via ORAL
  Filled 2021-11-28 (×17): qty 1

## 2021-11-28 NOTE — Progress Notes (Signed)
This chaplain is present for F/U spiritual care. The Pt. sister-Sandra is at the bedside. The Pt. Is open to the chaplain to the visit and describes herself as "active" today.   In the midst of the story telling the Pt. received a family phone call.  The chaplain will continue the visit on Wednesday.  Chaplain [name] [phone]

## 2021-11-28 NOTE — Progress Notes (Signed)
Daily Progress Note   Patient Name: [name] [name]       Date: 11/28/2021 DOB: October 03, 1951  Age: 71 y.o. MRN#: [id number] Attending Physician: Velna Ochs, MD Primary Care Physician: [name], MD Admit Date: 11/21/2021  Reason for Consultation/Follow-up: Establishing goals of care  Patient Profile/HPI:  71 y.o. female  with past medical history of COPD, CKD stage V, diastolic CHF (EF 40 to 43%), OSA, gout, iron deficient anemia, low back pain, lower extremity venous insufficiency with left lower extremity wound, HTN, TIA, and type 2 diabetes admitted on 11/21/2021 with syncope after straining on the toilet with abdominal pain, vomiting, and constipation.  Etiology of syncopal episode remains unclear.     CT of abdomen reveals exophytic soft tissue on the left kidney.  CT of the chest reveals left lower lobe nodule (2.4 cm).  Imaging suspect for malignancy.   Subjective: Chart reviewed including progress notes, labs, imaging.  [name] [name] is sitting up awake and alert. Her friend is at bedside and spouse [name] [name] is on speakerphone.  We reviewed her hospitalization and they all feel that [name] [name] is much improved today.  We discussed her medical decisions-  [name] [name] clearly states that if she were to need dialysis in the future then she would want to proceed with it.  [name] [name] has visited [place] is looking forward to participating in the many activities that will be offered.  [name] [name] notes that [name] [name] is back to her mental status baseline today.      Physical Exam Vitals and nursing note reviewed.  Cardiovascular:     Rate and Rhythm: Normal rate.  Pulmonary:     Effort: Pulmonary effort is normal.  Neurological:     Mental Status: She is alert.            Vital Signs: BP  137/71 (BP Location: Right Arm)    Pulse 93    Temp 98.5 F (36.9 C) (Oral)    Resp 18    Ht 5\' 3"  (1.6 m)    Wt 93.3 kg    SpO2 97%    BMI 36.44 kg/m  SpO2: SpO2: 97 % O2 Device: O2 Device: Nasal Cannula O2 Flow Rate: O2 Flow Rate (L/min): 2 L/min  Intake/output summary:  Intake/Output Summary (Last 24 hours) at 11/28/2021 1241 Last data filed at  11/28/2021 1123 Gross per 24 hour  Intake 720 ml  Output 750 ml  Net -30 ml   LBM: Last BM Date : 11/25/21 Baseline Weight: Weight: 93.3 kg Most recent weight: Weight: 93.3 kg       Palliative Assessment/Data: PPS: 50%      Patient Active Problem List   Diagnosis Date Noted   Acute respiratory failure with hypoxia ([place])    Labored breathing    Kidney lesion, native, left 11/24/2021   CKD (chronic kidney disease) stage 5, GFR less than 15 ml/min (HCC) 11/23/2021   Syncope 11/21/2021   Chronic diastolic (congestive) heart failure ([place]) 07/01/2021   Chronic pain syndrome 07/01/2021   COPD (chronic obstructive pulmonary disease) ([place]) 07/01/2021   Cellulitis of leg, right 07/01/2021   SARS-CoV-2 positive 02/21/2021   Increased weakness when ambulating 02/17/2021   Intractable nausea and vomiting 02/15/2021   Symptomatic anemia 01/24/2021   Anemia, unspecified 01/08/2020   Bacteremia 01/07/2020   UTI (urinary tract infection) 01/06/2020   Anemia 10/26/2019   S/P lumbar fusion 09/18/2019   Chronic CHF (congestive heart failure) ([place]) 03/16/2019   Leukocytosis 03/16/2019   Chest pain 11/27/2018   Pre-syncope 11/27/2018   Epigastric abdominal pain 11/27/2018   Solitary pulmonary nodule 11/06/2018   Hypertensive heart and renal disease, stage 1-4 or unspecified chronic kidney disease, with heart failure ([place]) 10/20/2018   CHF (congestive heart failure), NYHA class III, acute on chronic, systolic ([place]) 98/33/8250   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, unspecified extremity ([place]) 04/19/2016   Cellulitis  of abdominal wall 03/21/2015   Acute on chronic renal failure ([place]) 03/21/2015   DM (diabetes mellitus), type 2 with renal complications ([place]) 53/97/6734   Abdominal wall abscess 03/21/2015   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   Low urine output 08/11/2014   Acute on chronic systolic CHF (congestive heart failure), NYHA class 3 ([place]) 08/11/2014   Asthma, chronic 08/11/2014   Hypothyroidism 08/11/2014   OSA (obstructive sleep apnea) 08/11/2014   Positive cardiac stress test 06/29/2014   Hyperlipidemia 02/12/2014   Left arm weakness 12/23/2013   TIA (transient ischemic attack) 12/23/2013   Left buttock abscess 10/12/2013   Cellulitis and abscess of leg, right 04/16/2013   Acute blood loss anemia 02/14/2013   HTN (hypertension) 02/14/2013   Cellulitis and abscess of buttock 02/09/2013   Class 2 obesity due to excess calories with body mass index (BMI) of 36.0 to 36.9 in adult 02/03/2013   Diarrhea 02/03/2013   Hyponatremia 02/03/2013   Sepsis due to undetermined organism ([place]) 02/02/2013   Diabetes mellitus without complication ([place]) 19/37/9024   CKD (chronic kidney disease), stage IV ([place]) 02/02/2013   Cellulitis 02/02/2013   PAD (peripheral artery disease) ([place]) 04/26/2011   Tobacco abuse 04/26/2011    Palliative Care Assessment & Plan    Assessment/Recommendations/Plan  CKD5 in the setting of CHF and COPD- GOC are for continued life prolonging interventions- she is hoping to be discharged soon and be able to participate in activities with [facility], willing to do HD if necessary to keep her alive in the future Goals are clear and patient is nearing discharge- Palliative will chart check- please call if further assistance is needed.    Code Status: DNR  Prognosis:  Unable to determine  Discharge Planning: Home with [place] was discussed with patient and family.   Thank you for allowing the Palliative Medicine Team to assist in the care of this  patient.  [name] [name], AGNP-C Palliative Medicine   Please contact Palliative Medicine Team phone at [phone] for questions and concerns.

## 2021-11-28 NOTE — Progress Notes (Addendum)
HD#5 SUBJECTIVE:  Patient Summary: [name] [name] is a 71 y.o. with a pertinent PMH of DM2 and CKD stage 5, who presented with LOC and admitted for syncope.   Overnight Events: no acute events overnight    Interm History: Patient seen and evaluated at bedside. Is more awake and alert today. Says the room is too cold, refuses additional blankets, and is tired of frequent sticks for lab tests. Complains of pain in arm and some chest discomfort.   OBJECTIVE:  Vital Signs: Vitals:   11/28/21 0403 11/28/21 0405 11/28/21 1043 11/28/21 1053  BP: 137/71 137/71    Pulse: 91 91  93  Resp:  19  18  Temp: 98.5 F (36.9 C) 98.5 F (36.9 C)    TempSrc: Oral Oral    SpO2: 100% 100% 97% 97%  Weight:      Height:       Constitutional: Well-developed, well-nourished,  alert Neck: Normal range of motion.  Cardiovascular: Normal rate, regular rhythm, intact distal pulses. No gallop and no friction rub. No murmur heard. rope-like mass in right antecubital foss Pulmonary: Breathing appears improved today. Comfortable with normal work of breathing while not wearing Lovilia. Continued wheeze. Abdominal: Soft.  +bowel sounds. Non distended. No tenderness, nonperitonitic Musculoskeletal: Normal range of motion. Tender lower extremities, right more edematous than left, nonpitting Neurological: drowsy, but awakens to verbal and tactile stimuli. Does not appear to be confused at this time and responds appropriately.  Skin: wound left shin. Edematous, indurated, and erythema of left antecubital fossa. Ropey tender mass palpable in left AC fossa.    Pertinent Labs: CBC Latest Ref Rng & Units 11/28/2021 11/27/2021 11/26/2021  WBC 4.0 - 10.5 K/uL 12.5(H) 19.8(H) 21.0(H)  Hemoglobin 12.0 - 15.0 g/dL 8.3(L) 8.9(L) 8.8(L)  Hematocrit 36.0 - 46.0 % 27.5(L) 28.9(L) 28.8(L)  Platelets 150 - 400 K/uL 228 263 282    CMP Latest Ref Rng & Units 11/28/2021 11/27/2021 11/26/2021  Glucose 70 - 99 mg/dL 146(H) 191(H)  240(H)  BUN 8 - 23 mg/dL 147(H) 146(H) 157(H)  Creatinine 0.44 - 1.00 mg/dL 2.85(H) 2.75(H) 2.94(H)  Sodium 135 - 145 mmol/L 138 138 139  Potassium 3.5 - 5.1 mmol/L 3.8 3.8 4.1  Chloride 98 - 111 mmol/L 97(L) 98 98  CO2 22 - 32 mmol/L 30 27 27   Calcium 8.9 - 10.3 mg/dL 10.7(H) 10.9(H) 10.1  Total Protein 6.5 - 8.1 g/dL - 6.7 -  Total Bilirubin 0.3 - 1.2 mg/dL - 0.8 -  Alkaline Phos 38 - 126 U/L - 50 -  AST 15 - 41 U/L - 18 -  ALT 0 - 44 U/L - 13 -     ASSESSMENT/PLAN:   Assessment:  #CKD stage 5 approaching ESRD with evidence of uremia and volume overload. - after lengthy [place] discussions, patient is agreeable to dialysis. Nephro and VVS are following patient. No indication for urgent dialysis at this time. Her BUN and Cr showed improvement over the last several days but not shown further progress today. Cr appears to be at baselne, BUN remains elevated. Does not appear to be overloaded at this time, so will hold diuresis for today. She can likely be given her home oral diuretics now if she begins to demonstrate signs of volume overload again. Plan to resume home regimen once she is discharged. - If BUN and Cr begin to rise again, she would likely need TDC and AVG placement for initiation of HD. There is some concern for outpatient follow  up and patient may benefit from having AVG placement here, however, lung nodules will need to be more thoroughly worked up before proceeding with this. If renal function does begin to worsen again, can likely proceed with AVG. - Avoid nephrotoxic drugs and dose medications per GFR. She has neuropathic pain in her legs and can be given small doses 100mg  Gabapentin as needed for a maximum of 300mg  daily. For more severe pain, dilaudid can be given.  - Continue Sevelamer for her hyperphosphatemia  - Strict I&Os, daily weights, telemetry   #Acute Hypoxic Respiratory Failure 2/2 pulmonary edema #Possible COPD exacerbation  # lung nodules Patient had been  reporting shortness of breath and was found to have pulmonary edema on CXR. She improved with IV lasix. She also has some wheezing on exam and uses Symbicort at home. Breathing status has improved since initiation of breathing treatments as well. She is breathing comfortably and satting well. She has [place] ordered. Additionally, she was noted to have enlarging pulmonary nodules on CT scan suggestive of malignancy. Plan to evaluate these further with dedicated chest CT as this may alter our decision to proceed with treatment planning for HD.  -- Continue diuresis above, wean oxygen as able -- Start breo (equivalent to home symbicort)  -- Scheduled duonebs q4h while awake  -- Pred 40x 5 days  - f/u chest CT results  #Acute on Chronic HFmrEF Patient's echo 02/2021.  EF noted to be 45 to 50% at that time.  There was no wall motion abnormalities.  She was noted to have left ventricular hypertrophy, no valvulopathy, left atrium was noted to be severely dilated. ECHO shows mild worsening of EF to 40-45% in setting of worsening uremia.  Patient is on torsemide milligrams twice daily per EMR as well as metolazone 3 times a week. Appears to be euvolemic today. Her Home diuretics are being held for now. She may be diuresed if she begins to display evidence of volume overload again. She can likely resume her home regimen upon discharge.     - Strict I&Os - Daily weights   IV infiltration vs superficial thrombosis vs cellulitis of left AC fossa - vascular US obtained showing superficial thrombus as well as DVT in ulnar vein. Superficial thrombus able to be managed with warm compresses. More distal DVT has low probability of embolizing and can be observed at this time.  Cannot exclude possibility of cellulitis given leukocytosis, though she has been afebrile which is more suggestive of reactive leukocytosis. Will continue on abx for superficial cellulitis at this time. Bcx collected and these will need to be negative  effectively ruling out thrombophlebitis prior to proceeding with vascular access for HD. Fortunately, blood cultures have been negative thus far.  - follow up Bcx results.  - Continue abx, she is day 3/5  #Chronic Pain, Chronic use of opioids - has not been receiving her home oxycodone here. On review of PDMP, she receives #120 tablets of percocet 10-325 a month and fills regularly. With her progressive CKD, we have added oral dilaudid PRN rather than resuming her home oxy.  - Oral dilaudid PRN    #Constipation Generalized abdominal pain. Imaging revealed large stool burden. - miralax and senna daily    #T2DM Patient's last A1c was 02/2021 and noted to be 6.8.  Per EMR she takes 75/25 insulin 40 units in the a.m. and 30 units in the evening as well as Trulicity 1.5 mg weekly.  Patient is also on Reglan with 3  times daily dosing. -We will start patient on sliding scale insulin and continue her home Reglan.  Will uptitrate as necessary.  #Renal mass  Patient noted to have interval enlargement of L kidney exophytic density (now 3.2 cm) compared to 02/2021 imaging - patient underwent MRI imaging of this mass. 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    #LLL nodules Patient with left lower lobe nodules noted on previous imaging.  However, on CT obtained this hospitalization there noted to be larger, with the largest measuring 2.4 cm.  At risk for malignancy or metastatic disease  - will evaluate with chest CT this admission, but likely needs PET scan as outpatient   # Anemia of CKD/iron deficiency - patient has a history of hospitalization for upper GI bleed and she was noted to have an AVM on pill endoscopy. Patient was noted to be significantly iron deficient on labs 09/2021 with a ferritin of 11 and a transferrin saturation of 7%.  She did receive IV iron in the  past for this most recently, Feraheme 10/04/2021. - she has been started on IV iron. Will plan to follow daily CBC  Signature: Delene Ruffini, MD  Internal Medicine Resident, PGY-1 [facility]  Pager: [phone] 11:54 AM, 11/28/2021   Please contact the on call pager after 5 pm and on weekends at [phone].

## 2021-11-28 NOTE — Progress Notes (Signed)
Patient endorsing pain in right antecubital fossa area to nursing staff.  Present at bedside.  She has erythema and tenderness to the right antecubital fossa.  Extremities are warm to touch.  Per prior documentation this is the area where patient has DVT and is being treated for cellulitis as well.  We will continue with warm compresses and she received 1 dose of her IV Dilaudid by the nursing staff.

## 2021-11-28 NOTE — Progress Notes (Signed)
Physical Therapy Treatment Patient Details Name: [name] [name] MRN: [id number] DOB: [birth year] Today's Date: 11/28/2021   History of Present Illness Pt is a 71 year old woman who came to the ED on 11/21/21 after syncopal after straining on toilet (thought to be vasovagal), abdominal pain, vomiting and constipation. Pulmonary and renal imaging concerning for malignancy. PMH: COPD, CKD5, CHF with EF of 40-45%, OSA, gout, iron deficiency, low back pain, LE venous insufficiency with L LE wound, HTN, TIA, DM2.    PT Comments    Pt received in chair and agreeable to chair level exercise as she stated she had just transferred to chair. Pt with good tolerance for BLE exercises for increased strength and ROM, emphasis also on positioning and pressure relief techniques. Pt needing mod assist to come to standing from recliner to place doughnut pillow in seat. Pt with continued edema/tenderness of RLE to palpation, emphasis on elevation and ankle pumps to mitigate. Pt continues to benefit from skilled PT services to progress toward functional mobility goals.    Recommendations for follow up therapy are one component of a multi-disciplinary discharge planning process, led by the attending physician.  Recommendations may be updated based on patient status, additional functional criteria and insurance authorization.  Follow Up Recommendations  Home health PT (pt asking about more home and community services such as PACE, case mgr notified)     Assistance Recommended at Discharge Frequent or constant Supervision/Assistance  Patient can return home with the following Assistance with cooking/housework;Assist for transportation;Help with stairs or ramp for entrance;A lot of help with walking and/or transfers;A lot of help with bathing/dressing/bathroom   Equipment Recommendations  None recommended by PT    Recommendations for Other Services       Precautions / Restrictions Precautions Precautions:  Fall Precaution Comments: partial stand pivots at baseline, does better without RW Restrictions Weight Bearing Restrictions: No     Mobility  Bed Mobility       General bed mobility comments: pt OOB in recliner upon arrival    Transfers Overall transfer level: Needs assistance Equipment used: None Transfers: Sit to/from Stand, Bed to chair/wheelchair/BSC Sit to Stand: Mod assist           General transfer comment: assist to rise and steady to clear hips to place pillow under sacrum, increased time, pt does not use RW to transfer at home    Ambulation/Gait               General Gait Details: did not walk prior to admission   Stairs             Wheelchair Mobility    Modified Rankin (Stroke Patients Only)       Balance Overall balance assessment: Needs assistance Sitting-balance support: No upper extremity supported Sitting balance-Leahy Scale: Fair     Standing balance support: Bilateral upper extremity supported, During functional activity Standing balance-Leahy Scale: Poor Standing balance comment: reliant on B UE support                            Cognition Arousal/Alertness: Awake/alert Behavior During Therapy: WFL for tasks assessed/performed Overall Cognitive Status: Impaired/Different from baseline Area of Impairment: Following commands, Safety/judgement, Problem solving                       Following Commands: Follows one step commands with increased time, Follows one step commands consistently   Awareness: Emergent Problem  Solving: Slow processing General Comments: pt responds well when directing care and set up of furniture for safe transfer        Exercises General Exercises - Lower Extremity Ankle Circles/Pumps: AROM, Both, 20 reps, Seated Quad Sets: AROM, Left, 5 reps, Supine Long Arc Quad: AROM, Both, 20 reps, Seated Straight Leg Raises: AROM, AAROM, Both, 10 reps, Supine Hip Flexion/Marching: AROM,  Both, 20 reps, Seated Other Exercises Other Exercises: modified sit up in recliner x5    General Comments        Pertinent Vitals/Pain Pain Assessment Pain Assessment: Faces Faces Pain Scale: Hurts even more Pain Location: R LE with touch, RUE with movement, sacrum Pain Descriptors / Indicators: Moaning, Grimacing, Guarding Pain Intervention(s): Limited activity within patient's tolerance, Monitored during session, Repositioned, Other (comment) (placed doughnut pillow under pt)    Home Living                          Prior Function            PT Goals (current goals can now be found in the care plan section) Acute Rehab PT Goals Patient Stated Goal: to go home PT Goal Formulation: With patient Time For Goal Achievement: 12/07/21    Frequency    Min 3X/week      PT Plan Current plan remains appropriate    Co-evaluation              AM-PAC PT "6 Clicks" Mobility   Outcome Measure  Help needed turning from your back to your side while in a flat bed without using bedrails?: A Little Help needed moving from lying on your back to sitting on the side of a flat bed without using bedrails?: A Little Help needed moving to and from a bed to a chair (including a wheelchair)?: A Little Help needed standing up from a chair using your arms (e.g., wheelchair or bedside chair)?: A Lot Help needed to walk in hospital room?: Total Help needed climbing 3-5 steps with a railing? : Total 6 Click Score: 13    End of Session   Activity Tolerance: Patient tolerated treatment well Patient left: in chair;with call bell/phone within reach;with nursing/sitter in room;with family/visitor present Nurse Communication: Mobility status;Other (comment) (pt c/o constipation, asking about getting her leg rewrapped) PT Visit Diagnosis: Unsteadiness on feet (R26.81);Muscle weakness (generalized) (M62.81);Difficulty in walking, not elsewhere classified (R26.2);Pain Pain -  Right/Left:  (both) Pain - part of body: Ankle and joints of foot     Time: 1130-1150 PT Time Calculation (min) (ACUTE ONLY): 20 min  Charges:  $Therapeutic Exercise: 8-22 mins                     Ellijah Leffel R. PTA Acute Rehabilitation Services Office: [place] 11/28/2021, 12:05 PM

## 2021-11-28 NOTE — Progress Notes (Signed)
Patient ID: [name] [name], female   DOB: 03-15-1951, 71 y.o.   MRN: [id number] S:Breathing is better this morning, however she is complaining of right arm pain and weakness O:BP 137/71 (BP Location: Right Arm)    Pulse 91    Temp 98.5 F (36.9 C) (Oral)    Resp 19    Ht 5\' 3"  (1.6 m)    Wt 93.3 kg    SpO2 100%    BMI 36.44 kg/m   Intake/Output Summary (Last 24 hours) at 11/28/2021 1034 Last data filed at 11/28/2021 0343 Gross per 24 hour  Intake 670 ml  Output 500 ml  Net 170 ml   Intake/Output: I/O last 3 completed shifts: In: 1050 [P.O.:1050] Out: 1200 [Urine:1200]  Intake/Output this shift:  No intake/output data recorded. Weight change:  Gen:NAD CVS: RRR Resp: occ wheezing bilaterally Abd:+BS, soft, NT/ND Ext: no edema  Recent Labs  Lab 11/21/21 1413 11/22/21 0259 11/22/21 0300 11/23/21 0130 11/24/21 0738 11/25/21 0110 11/26/21 0122 11/27/21 0148 11/28/21 0311  NA 138 141  --  137 139 141 139 138 138  K 3.0* 3.3*  --  3.4* 3.7 4.0 4.1 3.8 3.8  CL 97* 98  --  96* 98 98 98 98 97*  CO2 25 27  --  27 28 30 27 27 30   GLUCOSE 199* 100*  --  199* 125* 154* 240* 191* 146*  BUN 191* 186*  --  189* 171* 169* 157* 146* 147*  CREATININE 3.12* 3.03*  --  3.42* 3.03* 2.98* 2.94* 2.75* 2.85*  ALBUMIN 3.0*  --   --  2.5* 3.1* 3.6  --  3.7 3.1*  CALCIUM 9.3 9.8  --  9.2 9.5 10.0 10.1 10.9* 10.7*  PHOS  --   --  6.1*  --  5.0* 4.3  --   --  4.8*  AST 20  --   --  18  --  18  --  18  --   ALT 15  --   --  12  --  9  --  13  --    Liver Function Tests: Recent Labs  Lab 11/23/21 0130 11/24/21 0738 11/25/21 0110 11/27/21 0148 11/28/21 0311  AST 18  --  18 18  --   ALT 12  --  9 13  --   ALKPHOS 50  --  45 50  --   BILITOT 0.3  --  0.2* 0.8  --   PROT 5.5*  --  6.3* 6.7  --   ALBUMIN 2.5*   < > 3.6 3.7 3.1*   < > = values in this interval not displayed.   Recent Labs  Lab 11/21/21 1413  LIPASE 65*   No results for input(s): AMMONIA in the last 168  hours. CBC: Recent Labs  Lab 11/21/21 1413 11/22/21 0259 11/23/21 0130 11/25/21 0110 11/26/21 1147 11/27/21 0148 11/28/21 0311  WBC 11.0*   < > 9.9 8.7 21.0* 19.8* 12.5*  NEUTROABS 9.3*  --   --   --  19.4*  --   --   HGB 7.9*   < > 7.4* 7.1* 8.8* 8.9* 8.3*  HCT 26.6*   < > 24.1* 23.6* 28.8* 28.9* 27.5*  MCV 89.9   < > 87.3 87.4 87.5 87.3 89.6  PLT 265   < > 260 252 282 263 228   < > = values in this interval not displayed.   Cardiac Enzymes: No results for input(s): CKTOTAL, CKMB, CKMBINDEX, TROPONINI in  the last 168 hours. CBG: Recent Labs  Lab 11/27/21 0932 11/27/21 1157 11/27/21 1601 11/27/21 2007 11/28/21 0742  GLUCAP 173* 201* 252* 197* 284*    Iron Studies: No results for input(s): IRON, TIBC, TRANSFERRIN, FERRITIN in the last 72 hours. Studies/Results: DG CHEST PORT 1 VIEW  Result Date: 11/26/2021 CLINICAL DATA:  Syncope with labored breathing. EXAM: PORTABLE CHEST 1 VIEW COMPARISON:  11/21/2021 FINDINGS: 1104 hours. The cardio pericardial silhouette is enlarged. There is pulmonary vascular congestion without overt pulmonary edema. Diffuse interstitial opacity suggest edema. Bones are diffusely demineralized. Telemetry leads overlie the chest. IMPRESSION: Cardiomegaly with interstitial pulmonary edema pattern. Electronically Signed   By: Misty Stanley M.D.   On: 11/26/2021 12:11   VAS US DUPLEX DIALYSIS ACCESS (AVF, AVG)  Result Date: 11/27/2021 DIALYSIS ACCESS Patient Name:  [name] [name]  Date of Exam:   11/27/2021 Medical Rec #: [id number]          Accession #:    [id number] Date of Birth: 09/10/1951           Patient Gender: F Patient Age:   22 years Exam Location:  [facility] Procedure:      VAS US DUPLEX DIALYSIS ACCESS (AVF, AVG) Referring Phys: Monica Martinez --------------------------------------------------------------------------------  Reason for Exam: Non-maturation of AVF. Access Site: Left Upper Extremity. Access Type: Basilic vein  transposition. History: 04-05-2020 Left first stage basilic vein transposition, slow to mature          AVF last seen on 06-30-2020. Limitations: Patient somnolence, patient attempting to retract arm multiple              times during examination, second tech required to hold arm in              position, size and depth of vessels. Performing Technologist: Darlin Coco RDMS, RVT Supporting Technologist: Rogelia Rohrer RVT, RDMS  Examination Guidelines: A complete evaluation includes B-mode imaging, spectral Doppler, color Doppler, and power Doppler as needed of all accessible portions of each vessel. Unilateral testing is considered an integral part of a complete examination. Limited examinations for reoccurring indications may be performed as noted.  Findings: +--------------------+----------+-----------------+--------+  AVF                  PSV (cm/s) Flow Vol (mL/min) Comments  +--------------------+----------+-----------------+--------+  Native artery inflow     82                                 +--------------------+----------+-----------------+--------+  +------------+----------+-------------+----------+-----------------------------+  OUTFLOW VEIN PSV (cm/s) Diameter (cm) Depth (cm)           Describe             +------------+----------+-------------+----------+-----------------------------+  Prox UA                                               Phasic, venous flow       +------------+----------+-------------+----------+-----------------------------+  Mid UA                                                Phasic, venous flow       +------------+----------+-------------+----------+-----------------------------+  Dist UA  Not well visualized due to                                                               vessel size           +------------+----------+-------------+----------+-----------------------------+  [facility] Fossa                                          Not well  visualized due to                                                               vessel size           +------------+----------+-------------+----------+-----------------------------+  Prox Forearm                                      Not well visualized due to                                                               vessel size           +------------+----------+-------------+----------+-----------------------------+  Mid Forearm                                       Not well visualized due to                                                               vessel size           +------------+----------+-------------+----------+-----------------------------+  Dist Forearm                                      Not well visualized due to                                                               vessel size           +------------+----------+-------------+----------+-----------------------------+     --------------------------------------------------------------------------------   Preliminary    VAS [name] UPPER EXT VEIN MAPPING (PRE-OP AVF)  Result Date: 11/27/2021 [place] Patient Name:  Maevis P Charity  Date of Exam:   11/27/2021 Medical Rec #: [id number]          Accession #:    [id number] Date of Birth: Nov 18, 1950           Patient Gender: F Patient Age:   41 years Exam Location:  [facility] Procedure:      VAS [name] UPPER EXT VEIN MAPPING (PRE-OP AVF) Referring Phys: Monica Martinez --------------------------------------------------------------------------------  Indications: Pre-access. History: 04-05-2020 Left first stage basilic vein transposition, slow to mature          AVF last seen on 06-30-2020.  Limitations: Patient somnolence, patient attempting to retract arm multiple              times during examination, size and depth of vessels. Performing Technologist: Darlin Coco RDMS, RVT  Examination Guidelines: A complete evaluation includes B-mode imaging, spectral Doppler,  color Doppler, and power Doppler as needed of all accessible portions of each vessel. Bilateral testing is considered an integral part of a complete examination. Limited examinations for reoccurring indications may be performed as noted. +-----------------+-------------+----------+--------------+  Left Cephalic     Diameter (cm) Depth (cm)    Findings     +-----------------+-------------+----------+--------------+  Shoulder              0.24         1.40                    +-----------------+-------------+----------+--------------+  Prox upper arm        0.16         0.67                    +-----------------+-------------+----------+--------------+  Mid upper arm                              not visualized  +-----------------+-------------+----------+--------------+  Dist upper arm                             not visualized  +-----------------+-------------+----------+--------------+  Antecubital fossa                          not visualized  +-----------------+-------------+----------+--------------+  Prox forearm          0.12         0.90                    +-----------------+-------------+----------+--------------+  Mid forearm           0.16         0.76                    +-----------------+-------------+----------+--------------+  Dist forearm          0.08         0.12                    +-----------------+-------------+----------+--------------+ *See table(s) above for measurements and observations.  Diagnosing physician: Monica Martinez MD Electronically signed by Monica Martinez MD on 11/27/2021 at 2:38:18 PM.    Final    VAS [name] UPPER EXTREMITY VENOUS DUPLEX  Result Date: 11/27/2021 UPPER VENOUS STUDY  Patient Name:  YOCELINE BAZAR  Date of Exam:   11/26/2021 Medical Rec #: [id number]          Accession #:  [id number] Date of Birth: [birth year]           Patient Gender: F Patient Age:   31 years Exam Location:  [facility] Procedure:      VAS [name] UPPER EXTREMITY VENOUS DUPLEX Referring  Phys: Velna Ochs --------------------------------------------------------------------------------  Indications: Pain, and Edema Comparison Study: No prior study Performing Technologist: Maudry Mayhew MHA, RDMS, RVT, RDCS  Examination Guidelines: A complete evaluation includes B-mode imaging, spectral Doppler, color Doppler, and power Doppler as needed of all accessible portions of each vessel. Bilateral testing is considered an integral part of a complete examination. Limited examinations for reoccurring indications may be performed as noted.  Right Findings: +----------+------------+---------+-----------+----------+-----------------+  RIGHT      Compressible Phasicity Spontaneous Properties      Summary       +----------+------------+---------+-----------+----------+-----------------+  IJV            Full        Yes        Yes                                   +----------+------------+---------+-----------+----------+-----------------+  Subclavian     Full        Yes        Yes                                   +----------+------------+---------+-----------+----------+-----------------+  Axillary       Full        Yes        Yes                                   +----------+------------+---------+-----------+----------+-----------------+  Brachial       Full        Yes        Yes                                   +----------+------------+---------+-----------+----------+-----------------+  Radial         Full                                                         +----------+------------+---------+-----------+----------+-----------------+  Ulnar          None                   No                 Age Indeterminate  +----------+------------+---------+-----------+----------+-----------------+  Cephalic       None                                            Acute        +----------+------------+---------+-----------+----------+-----------------+  Basilic        Full                                                          +----------+------------+---------+-----------+----------+-----------------+  Left Findings: +----------+------------+---------+-----------+----------+-------+  LEFT       Compressible Phasicity Spontaneous Properties Summary  +----------+------------+---------+-----------+----------+-------+  Subclavian                 Yes        Yes                         +----------+------------+---------+-----------+----------+-------+  Summary:  Right: Findings consistent with acute superficial vein thrombosis involving the right cephalic vein. Findings consistent with age indeterminate deep vein thrombosis involving the right ulnar veins.  Left: No evidence of thrombosis in the subclavian.  *See table(s) above for measurements and observations.  Diagnosing physician: Monica Martinez MD Electronically signed by Monica Martinez MD on 11/27/2021 at 2:36:20 PM.    Final     amoxicillin-clavulanate  1 tablet Oral BID   carvedilol  12.5 mg Oral BID   fluticasone furoate-vilanterol  1 puff Inhalation Daily   heparin  5,000 Units Subcutaneous Q8H   hydrALAZINE  50 mg Oral Q8H   insulin aspart  0-9 Units Subcutaneous TID WC   insulin glargine-yfgn  10 Units Subcutaneous Daily   ipratropium-albuterol  3 mL Nebulization TID   levothyroxine  200 mcg Oral Q0600   metoCLOPramide  5 mg Oral TID   [START ON 11/29/2021] metolazone  5 mg Oral Once per day on Mon Wed Fri   pantoprazole  40 mg Oral QHS   predniSONE  20 mg Oral Q breakfast   sevelamer carbonate  800 mg Oral TID WC   torsemide  100 mg Oral BID    BMET    Component Value Date/Time   NA 138 11/28/2021 0311   NA 146 (H) 12/23/2013 1132   K 3.8 11/28/2021 0311   CL 97 (L) 11/28/2021 0311   CO2 30 11/28/2021 0311   GLUCOSE 146 (H) 11/28/2021 0311   BUN 147 (H) 11/28/2021 0311   BUN 31 (H) 12/23/2013 1132   CREATININE 2.85 (H) 11/28/2021 0311   CREATININE 3.51 (HH) 10/17/2020 1234   CREATININE 1.32 (H) 06/29/2014 1100   CALCIUM 10.7 (H)  11/28/2021 0311   CALCIUM 9.1 09/07/2021 1036   GFRNONAA 17 (L) 11/28/2021 0311   GFRNONAA 13 (L) 10/17/2020 1234   GFRAA 15 (L) 04/22/2020 1129   CBC    Component Value Date/Time   WBC 12.5 (H) 11/28/2021 0311   RBC 3.07 (L) 11/28/2021 0311   HGB 8.3 (L) 11/28/2021 0311   HCT 27.5 (L) 11/28/2021 0311   HCT 24.6 (L) 10/17/2020 1235   PLT 228 11/28/2021 0311   MCV 89.6 11/28/2021 0311   MCH 27.0 11/28/2021 0311   MCHC 30.2 11/28/2021 0311   RDW 21.6 (H) 11/28/2021 0311   RDW 15.7 (H) 12/23/2013 1132   LYMPHSABS 0.5 (L) 11/26/2021 1147   LYMPHSABS 2.5 12/23/2013 1132   MONOABS 0.9 11/26/2021 1147   EOSABS 0.1 11/26/2021 1147   EOSABS 0.1 12/23/2013 1132   BASOSABS 0.0 11/26/2021 1147   BASOSABS 0.0 12/23/2013 1132    Assessment/Plan:   Non-Oliguric AKI/azotemia: Creatinine is back to baseline and BUN improving daily.  However, BUN fairly high.  Unclear what caused BUN elevation to be so high out of proportion to creatinine.  She has not demonstrated signs of uremia thus far although had been confused over the weekend. I suspect this is delirium and not uremia given the BUN has continued to trend down  Recommend to hold diuretics as she does not appear volume  overloaded on exam and BUN elevated.  SOB likely due to COPD and deconditioning more than volume Patient has been somewhat hesitant to move forward with dialysis in the past but now agrees if needed.  Fortunately does not need today.   Continue to monitor daily Cr, Dose meds for GFR  Monitor Daily I/Os, Daily weight  Maintain MAP>65 for optimal renal perfusion. Avoid nephrotoxic medications including NSAIDs, IV Contrast and phosphate containing bowel preps (FLEETS)  Use synthetic opioids (Fentanyl/Dilaudid) if needed If her BUN/Cr start to rise again, would then plan on [facility] and AVG placement and initiation of HD during this hospitalization.  If compliance with follow up is a concern, would then have Dr. Carlis Abbott proceed with AVG  placement. Congestive heart failure: EF around 40 to 24% with diastolic dysfunction.  Lower extremity edema persistent but also may be multifactorial.  Holding diuresis  Vascular access: Would recommend placement of AVG during this hospitalization and possible tunneled HD catheter if her renal function were to worsen, however she seems to be stable for now.  Lung nodules - concerning for malignancy and PET scan was recommended.   COPD - longtime smoker.  Continue with nebs per primary.  Likely etiology of SOB. Consider CT scan especially given enlarging lower lobe nodules and concern for malignancy with her smoking history.  Hyperphosphatemia: Sevelamer  Hypercalemia - not on vitamin D or calcium supplements, likely due to immobility.  Ambulate as tolerated but may also be due to malignancy as above.  Anemia of CKD: Iron deficiency.  IV iron ordered.  Monitor hemoglobin trend.  Hold off on ESA for now given possible lung cancer. 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  Follow-up 3 to 6 months. HTN: Coreg 12.5 mg twice daily.  Blood pressure goal Right lower extremity edema: Predominantly on the right side.  Had PVL of the right leg on 2/15 that was negative for DVT.  Improved.  Donetta Potts, MD [employer] [phone]

## 2021-11-28 NOTE — Progress Notes (Signed)
[facility]  Subjective  -feels her breathing is a bit better today.   Objective 137/71 91 98.5 F (36.9 C) (Oral) 19 100%  Intake/Output Summary (Last 24 hours) at 11/28/2021 0646 Last data filed at 11/28/2021 0343 Gross per 24 hour  Intake 880 ml  Output 1000 ml  Net -120 ml    Palpable radial and brachial pulses bilateral upper extremities  Laboratory Lab Results: Recent Labs    11/27/21 0148 11/28/21 0311  WBC 19.8* 12.5*  HGB 8.9* 8.3*  HCT 28.9* 27.5*  PLT 263 228   BMET Recent Labs    11/27/21 0148 11/28/21 0311  NA 138 138  K 3.8 3.8  CL 98 97*  CO2 27 30  GLUCOSE 191* 146*  BUN 146* 147*  CREATININE 2.75* 2.85*  CALCIUM 10.9* 10.7*    COAG Lab Results  Component Value Date   INR 1.0 07/01/2021   INR 1.0 01/05/2021   INR 1.2 01/06/2020   No results found for: PTT  Assessment/Planning:  71 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  I discussed with Dr. Marval Regal and he wanted to wait on [facility] placement.  She was developing some pulmonary edema and got some IV Lasix with some additional DuoNebs and prednisone for COPD exacerbation.  Feels her breathing is a bit better today.    Duplex yesterday cannot visualize the basilic vein in her left arm previously placed by Dr. Scot Dock.  Suspect she will need left arm AV graft as her cephalic vein is small as well.  We will follow in the periphery pending any changes to the plan.  Dr. Marval Regal discussed possible outpatient placement access yesterday.    Marty Heck 11/28/2021 6:46 AM --

## 2021-11-28 NOTE — Progress Notes (Signed)
Occupational Therapy Treatment Patient Details Name: [name] [name] MRN: [id number] DOB: November 07, 1950 Today's Date: 11/28/2021   History of present illness Pt is a 71 year old woman who came to the ED on 11/21/21 after syncopal after straining on toilet (thought to be vasovagal), abdominal pain, vomiting and constipation. Pulmonary and renal imaging concerning for malignancy. PMH: COPD, CKD5, CHF with EF of 40-45%, OSA, gout, iron deficiency, low back pain, LE venous insufficiency with L LE wound, HTN, TIA, DM2.   OT comments  Patient received in bed and agreeable to address toilet transfer. Patient was min assist to get to EOB and stood from EOB with mod assist. Patient began to urinate while standing and ask to sit due to dizziness. After short rest patient transferred to [facility] with RW and performed LB bathing, gown change, and changed socks while seated on BSC.  Patient was able to use RW to transfer back to EOB with  increased time and min assist.  Patient required mod assist to return to supine due to assistance needed with BLEs.  Acute OT to continue to follow.    Recommendations for follow up therapy are one component of a multi-disciplinary discharge planning process, led by the attending physician.  Recommendations may be updated based on patient status, additional functional criteria and insurance authorization.    Follow Up Recommendations  Home health OT    Assistance Recommended at Discharge Frequent or constant Supervision/Assistance  Patient can return home with the following  A little help with walking and/or transfers;A lot of help with bathing/dressing/bathroom;Assistance with cooking/housework;Assist for transportation;Help with stairs or ramp for entrance   Equipment Recommendations  None recommended by OT    Recommendations for Other Services      Precautions / Restrictions Precautions Precautions: Fall Precaution Comments: partial stand pivots at baseline, does better  without RW Restrictions Weight Bearing Restrictions: No       Mobility Bed Mobility Overal bed mobility: Needs Assistance Bed Mobility: Sit to Supine, Supine to Sit     Supine to sit: Min assist Sit to supine: Mod assist   General bed mobility comments: required assistance with BLE to get back to supine    Transfers Overall transfer level: Needs assistance Equipment used: Rolling walker (2 wheels) Transfers: Sit to/from Stand, Bed to chair/wheelchair/BSC Sit to Stand: Mod assist     Step pivot transfers: Min assist     General transfer comment: increased time to perform transfer to [facility] and back to EOB using RW     Balance Overall balance assessment: Needs assistance Sitting-balance support: No upper extremity supported Sitting balance-Leahy Scale: Fair     Standing balance support: Bilateral upper extremity supported, During functional activity Standing balance-Leahy Scale: Poor Standing balance comment: reliant on B UE support                           ADL either performed or assessed with clinical judgement   ADL Overall ADL's : Needs assistance/impaired             Lower Body Bathing: Moderate assistance;Sitting/lateral leans Lower Body Bathing Details (indicate cue type and reason): patient able to bathe peri area and legs with assitance to bathe feet while seated on BSC Upper Body Dressing : Minimal assistance;Sitting Upper Body Dressing Details (indicate cue type and reason): changed gowns Lower Body Dressing: Maximal assistance;Sitting/lateral leans Lower Body Dressing Details (indicate cue type and reason): changed socks Toilet Transfer: Minimal assistance;Stand-pivot;BSC/3in1 Toilet  Transfer Details (indicate cue type and reason): EOB to [facility] Toileting- Clothing Manipulation and Hygiene: Supervision/safety;Sitting/lateral lean Toileting - Clothing Manipulation Details (indicate cue type and reason): cleaned front while seated        General ADL Comments: Patient stood to transfer to [facility] and began urinating.  Performed cleaning and clothing change seated on [facility]    Extremity/Trunk Assessment Upper Extremity Assessment RUE Deficits / Details: longstanding shoulder limitations RUE Coordination: decreased gross motor            Vision       Perception     Praxis      Cognition Arousal/Alertness: Awake/alert Behavior During Therapy: WFL for tasks assessed/performed Overall Cognitive Status: Impaired/Different from baseline Area of Impairment: Following commands, Safety/judgement, Problem solving                       Following Commands: Follows one step commands with increased time, Follows one step commands consistently   Awareness: Emergent Problem Solving: Slow processing General Comments: followed commands well        Exercises      Shoulder Instructions       General Comments      Pertinent Vitals/ Pain       Pain Assessment Pain Assessment: Faces Faces Pain Scale: Hurts even more Pain Location: R LE with touch, RUE with movement, sacrum Pain Descriptors / Indicators: Moaning, Grimacing, Guarding Pain Intervention(s): Limited activity within patient's tolerance, Monitored during session, Repositioned  Home Living                                          Prior Functioning/Environment              Frequency  Min 2X/week        Progress Toward Goals  OT Goals(current goals can now be found in the care plan section)  Progress towards OT goals: Progressing toward goals  Acute Rehab OT Goals OT Goal Formulation: With patient Time For Goal Achievement: 12/07/21 Potential to Achieve Goals: Fair ADL Goals Pt Will Transfer to Toilet: with modified independence;stand pivot transfer;bedside commode Pt Will Perform Toileting - Clothing Manipulation and hygiene: with modified independence;sitting/lateral leans Additional ADL Goal #1: Pt will perform bed  mobility modified independently in preparation for ADLs.  Plan Discharge plan remains appropriate    Co-evaluation                 AM-PAC OT "6 Clicks" Daily Activity     Outcome Measure   Help from another person eating meals?: None Help from another person taking care of personal grooming?: A Little Help from another person toileting, which includes using toliet, bedpan, or urinal?: Total Help from another person bathing (including washing, rinsing, drying)?: A Lot Help from another person to put on and taking off regular upper body clothing?: A Little Help from another person to put on and taking off regular lower body clothing?: Total 6 Click Score: 14    End of Session Equipment Utilized During Treatment: Gait belt;Rolling walker (2 wheels)  OT Visit Diagnosis: Unsteadiness on feet (R26.81);Other abnormalities of gait and mobility (R26.89);Muscle weakness (generalized) (M62.81)   Activity Tolerance Patient tolerated treatment well   Patient Left in bed;with call bell/phone within reach;with bed alarm set;with family/visitor present   Nurse Communication Mobility status        Time: [phone]  OT Time Calculation (min): 33 min  Charges: OT General Charges $OT Visit: 1 Visit OT Treatments $Self Care/Home Management : 23-37 mins  Lodema Hong, Bernard  Pager [phone] Office [place] 11/28/2021, 3:33 PM

## 2021-11-29 ENCOUNTER — Telehealth: Payer: Self-pay | Admitting: Pulmonary Disease

## 2021-11-29 DIAGNOSIS — J9601 Acute respiratory failure with hypoxia: Secondary | ICD-10-CM | POA: Diagnosis not present

## 2021-11-29 DIAGNOSIS — D631 Anemia in chronic kidney disease: Secondary | ICD-10-CM

## 2021-11-29 DIAGNOSIS — N186 End stage renal disease: Secondary | ICD-10-CM

## 2021-11-29 DIAGNOSIS — R918 Other nonspecific abnormal finding of lung field: Secondary | ICD-10-CM | POA: Diagnosis not present

## 2021-11-29 DIAGNOSIS — J811 Chronic pulmonary edema: Secondary | ICD-10-CM | POA: Diagnosis not present

## 2021-11-29 DIAGNOSIS — R911 Solitary pulmonary nodule: Secondary | ICD-10-CM

## 2021-11-29 DIAGNOSIS — Z992 Dependence on renal dialysis: Secondary | ICD-10-CM | POA: Diagnosis not present

## 2021-11-29 DIAGNOSIS — I5023 Acute on chronic systolic (congestive) heart failure: Secondary | ICD-10-CM | POA: Diagnosis not present

## 2021-11-29 DIAGNOSIS — F1110 Opioid abuse, uncomplicated: Secondary | ICD-10-CM

## 2021-11-29 DIAGNOSIS — Z7189 Other specified counseling: Secondary | ICD-10-CM | POA: Diagnosis not present

## 2021-11-29 DIAGNOSIS — G8929 Other chronic pain: Secondary | ICD-10-CM

## 2021-11-29 LAB — RENAL FUNCTION PANEL
Albumin: 3 g/dL — ABNORMAL LOW (ref 3.5–5.0)
Anion gap: 15 (ref 5–15)
BUN: 163 mg/dL — ABNORMAL HIGH (ref 8–23)
CO2: 24 mmol/L (ref 22–32)
Calcium: 10 mg/dL (ref 8.9–10.3)
Chloride: 95 mmol/L — ABNORMAL LOW (ref 98–111)
Creatinine, Ser: 3.49 mg/dL — ABNORMAL HIGH (ref 0.44–1.00)
GFR, Estimated: 13 mL/min — ABNORMAL LOW (ref >60)
Glucose, Bld: 244 mg/dL — ABNORMAL HIGH (ref 70–99)
Phosphorus: 3.9 mg/dL (ref 2.5–4.6)
Potassium: 4.3 mmol/L (ref 3.5–5.1)
Sodium: 134 mmol/L — ABNORMAL LOW (ref 135–145)

## 2021-11-29 LAB — GLUCOSE, CAPILLARY
Glucose-Capillary: 195 mg/dL — ABNORMAL HIGH (ref 70–99)
Glucose-Capillary: 162 mg/dL — ABNORMAL HIGH (ref 70–99)
Glucose-Capillary: 343 mg/dL — ABNORMAL HIGH (ref 70–99)
Glucose-Capillary: 307 mg/dL — ABNORMAL HIGH (ref 70–99)

## 2021-11-29 LAB — CBC
HCT: 23.7 % — ABNORMAL LOW (ref 36.0–46.0)
Hemoglobin: 7.4 g/dL — ABNORMAL LOW (ref 12.0–15.0)
MCH: 27 pg (ref 26.0–34.0)
MCHC: 31.2 g/dL (ref 30.0–36.0)
MCV: 86.5 fL (ref 80.0–100.0)
Platelets: 214 10*3/uL (ref 150–400)
RBC: 2.74 MIL/uL — ABNORMAL LOW (ref 3.87–5.11)
RDW: 21.1 % — ABNORMAL HIGH (ref 11.5–15.5)
WBC: 12.9 10*3/uL — ABNORMAL HIGH (ref 4.0–10.5)
nRBC: 0.2 % (ref 0.0–0.2)

## 2021-11-29 MED ORDER — TRAZODONE HCL 50 MG PO TABS
50.0000 mg | ORAL_TABLET | Freq: Every day | ORAL | Status: DC
  Administered 2021-11-29 – 2021-12-06 (×7): 50 mg via ORAL
  Filled 2021-11-29 (×7): qty 1

## 2021-11-29 MED ORDER — INSULIN GLARGINE-YFGN 100 UNIT/ML ~~LOC~~ SOLN
15.0000 [IU] | Freq: Every day | SUBCUTANEOUS | Status: DC
  Administered 2021-11-30 – 2021-12-02 (×3): 15 [IU] via SUBCUTANEOUS
  Filled 2021-11-29 (×5): qty 0.15

## 2021-11-29 MED ORDER — CEFAZOLIN SODIUM-DEXTROSE 2-4 GM/100ML-% IV SOLN: 2.0000 g | INTRAVENOUS | Status: AC

## 2021-11-29 MED ORDER — POLYETHYLENE GLYCOL 3350 17 G PO PACK
17.0000 g | PACK | Freq: Two times a day (BID) | ORAL | Status: AC
  Administered 2021-11-29: 17 g via ORAL
  Filled 2021-11-29: qty 1

## 2021-11-29 MED ORDER — SENNA 8.6 MG PO TABS
2.0000 | ORAL_TABLET | Freq: Every day | ORAL | Status: DC
  Administered 2021-11-29 – 2021-12-07 (×6): 17.2 mg via ORAL
  Filled 2021-11-29 (×8): qty 2

## 2021-11-29 MED ORDER — INSULIN ASPART 100 UNIT/ML IJ SOLN
5.0000 [IU] | Freq: Three times a day (TID) | INTRAMUSCULAR | Status: DC
  Administered 2021-11-29 – 2021-12-07 (×20): 5 [IU] via SUBCUTANEOUS

## 2021-11-29 MED ORDER — PHENYLEPHRINE HCL-NACL 20-0.9 MG/250ML-% IV SOLN
INTRAVENOUS | Status: AC
  Filled 2021-11-29: qty 250

## 2021-11-29 NOTE — Progress Notes (Signed)
Occupational Therapy Treatment Patient Details Name: [name] [name] MRN: [id number] DOB: 1950-12-02 Today's Date: 11/29/2021   History of present illness Pt is a 71 year old woman who came to the ED on 11/21/21 after syncopal after straining on toilet (thought to be vasovagal), abdominal pain, vomiting and constipation. Pulmonary and renal imaging concerning for malignancy. PMH: COPD, CKD5, CHF with EF of 40-45%, OSA, gout, iron deficiency, low back pain, LE venous insufficiency with L LE wound, HTN, TIA, DM2.   OT comments  Pt weaker today. Requiring moderate assistance for supine to sit and to transfer to chair. Pt reports she did not sleep well last night. Uncomfortable on donut cushion, removed. Pt reports her husband stays with his brother who had a stroke and cannot help her for a few days when she returns home. Pt does not appear to appreciate the extent of her kidney disease, states she is not going on HD anytime soon. She remains adamant she does not want SNF. Husband is looking into PACE.    Recommendations for follow up therapy are one component of a multi-disciplinary discharge planning process, led by the attending physician.  Recommendations may be updated based on patient status, additional functional criteria and insurance authorization.    Follow Up Recommendations  Home health OT    Assistance Recommended at Discharge Frequent or constant Supervision/Assistance  Patient can return home with the following  A little help with walking and/or transfers;A lot of help with bathing/dressing/bathroom;Assistance with cooking/housework;Assist for transportation;Help with stairs or ramp for entrance   Equipment Recommendations  None recommended by OT    Recommendations for Other Services      Precautions / Restrictions Precautions Precautions: Fall Precaution Comments: partial stand pivots at baseline, does better without RW       Mobility Bed Mobility Overal bed mobility:  Needs Assistance Bed Mobility: Supine to Sit     Supine to sit: Mod assist     General bed mobility comments: HOB up, assist to raise trunk and for hips to EOB using bed pad    Transfers Overall transfer level: Needs assistance Equipment used: None Transfers: Sit to/from Stand Sit to Stand: Mod assist   Squat pivot transfers: Mod assist       General transfer comment: increased time, pt having more trouble advancing her feet to turn today     Balance Overall balance assessment: Needs assistance   Sitting balance-[name] [name] Scale: Fair       Standing balance-[name] [name] Scale: Poor Standing balance comment: reliant on B UE support                           ADL either performed or assessed with clinical judgement   ADL Overall ADL's : Needs assistance/impaired Eating/Feeding: Set up;Sitting   Grooming: Wash/dry hands;Wash/dry face;Sitting;Set up                                      Extremity/Trunk Assessment              Vision       Perception     Praxis      Cognition Arousal/Alertness: Awake/alert Behavior During Therapy: WFL for tasks assessed/performed Overall Cognitive Status: Impaired/Different from baseline Area of Impairment: Following commands, Safety/judgement, Problem solving  Following Commands: Follows one step commands with increased time, Follows one step commands consistently Safety/Judgement: Decreased awareness of safety   Problem Solving: Slow processing          Exercises      Shoulder Instructions       General Comments      Pertinent Vitals/ Pain       Pain Assessment Pain Assessment: Faces Faces Pain Scale: Hurts little more Pain Location: R elbow Pain Descriptors / Indicators: Sore Pain Intervention(s): Monitored during session  Home Living                                          Prior Functioning/Environment              Frequency   Min 2X/week        Progress Toward Goals  OT Goals(current goals can now be found in the care plan section)  Progress towards OT goals: Progressing toward goals  Acute Rehab OT Goals OT Goal Formulation: With patient Time For Goal Achievement: 12/07/21 Potential to Achieve Goals: [place] Discharge plan remains appropriate    Co-evaluation                 AM-PAC OT "6 Clicks" Daily Activity     Outcome Measure   Help from another person eating meals?: A Little Help from another person taking care of personal grooming?: A Little Help from another person toileting, which includes using toliet, bedpan, or urinal?: Total Help from another person bathing (including washing, rinsing, drying)?: A Lot Help from another person to put on and taking off regular upper body clothing?: A Little Help from another person to put on and taking off regular lower body clothing?: Total 6 Click Score: 13    End of Session Equipment Utilized During Treatment: Gait belt  OT Visit Diagnosis: Unsteadiness on feet (R26.81);Other abnormalities of gait and mobility (R26.89);Muscle weakness (generalized) (M62.81)   Activity Tolerance Patient tolerated treatment well   Patient Left in chair;with call bell/phone within reach;with chair alarm set   Nurse Communication          Time: [phone] OT Time Calculation (min): 28 min  Charges: OT General Charges $OT Visit: 1 Visit OT Treatments $Self Care/Home Management : 23-37 mins Nestor Lewandowsky, OTR/L Acute Rehabilitation Services Pager: [phone] Office: [phone]   [name] 11/29/2021, 8:51 AM

## 2021-11-29 NOTE — Progress Notes (Signed)
[facility]  Subjective  -creatinine and BUN continue to rise.  She is amendable to dialysis.   Objective (!) 148/69 92 98 F (36.7 C) (Oral) 16 93%  Intake/Output Summary (Last 24 hours) at 11/29/2021 1532 Last data filed at 11/29/2021 1400 Gross per 24 hour  Intake 400 ml  Output 1100 ml  Net -700 ml    Palpable radial brachial pulses bilateral upper extremities  Laboratory Lab Results: Recent Labs    11/28/21 0311 11/29/21 0313  WBC 12.5* 12.9*  HGB 8.3* 7.4*  HCT 27.5* 23.7*  PLT 228 214   BMET Recent Labs    11/28/21 0311 11/29/21 0313  NA 138 134*  K 3.8 4.3  CL 97* 95*  CO2 30 24  GLUCOSE 146* 244*  BUN 147* 163*  CREATININE 2.85* 3.49*  CALCIUM 10.7* 10.0    COAG Lab Results  Component Value Date   INR 1.0 07/01/2021   INR 1.0 01/05/2021   INR 1.2 01/06/2020   No results found for: PTT  Assessment/Planning:  71 year old female with 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  Her creatinine and BUN continue to rise and nephrology is now recommending initiation of dialysis.  Appears IR has been consulted for placement of TDC catheter tomorrow.  I have put her on the schedule with me Friday for AV graft in the left arm in the OR.  Discussed with patient and her sister.  Her previous basilic vein fistula placed by Dr. Scot Dock in the left arm did not mature and she has no other surface veins in the left arm.  Would avoid the right arm for now given swelling with acute thrombosis of the cephalic vein and age-indeterminate thrombosis of the ulnar vein and right arm pain.  Marty Heck 11/29/2021 3:32 PM --

## 2021-11-29 NOTE — Progress Notes (Signed)
This chaplain is present for F/U spiritual care as requested by the Pt. The chaplain reviewed the Pt. chart before the visit. The Pt. is sitting up in the bedside recliner and presents herself with less energy than yesterday. The RN is giving medicine at the time of the visit.   The Pt. husband-[name] arrives and shares "no one has discussed a Palliative Care relationship with him". [name] [name] shares the reason he is visiting is to "discuss Pace with his wife." The chaplain understands the Pt. and [name] [name] have agreed to proceed with placement of catheter and  HD today. The chaplain understands [name] [name] is open to a Palliative Care provider visiting today while he is present.  Chaplain [name] [phone]

## 2021-11-29 NOTE — Progress Notes (Signed)
HD#6 SUBJECTIVE:  Patient Summary: [name] [name] is a 71 y.o. with a pertinent PMH of DM2 and CKD stage 5, who presented with LOC and admitted for syncope.   Overnight Events: no acute events overnight    Interm History: Patient seen and evaluated. Sitting up in chair. Discussed CT chest findings. Patient expressed feelings of being overwhelmed.   OBJECTIVE:  Vital Signs: Vitals:   11/29/21 0420 11/29/21 0500 11/29/21 0731 11/29/21 0800  BP: (!) 147/78   (!) 148/69  Pulse: 87  90 92  Resp:   18 16  Temp: 98.6 F (37 C)   98 F (36.7 C)  TempSrc: Oral   Oral  SpO2: 97%   93%  Weight:  97.2 kg    Height:       Constitutional: Well-developed, well-nourished,  alert Neck: Normal range of motion.  Cardiovascular: Normal rate, regular rhythm, intact distal pulses. No gallop and no friction rub. No murmur heard. rope-like mass in right antecubital fossa Pulmonary: Breathing appears improved today. Comfortable with normal work of breathing while not wearing [place].  Abdominal: Soft.  +bowel sounds. Non distended. No tenderness, nonperitonitic Musculoskeletal: Normal range of motion. Tender lower extremities, right more edematous than left, nonpitting Neurological: drowsy, but awakens to verbal and tactile stimuli. Does not appear to be confused at this time and responds appropriately.  Skin: wound left shin. Edematous, indurated, and erythema of left antecubital fossa. Ropey tender mass palpable in left AC fossa.    Pertinent Labs: CBC Latest Ref Rng & Units 11/29/2021 11/28/2021 11/27/2021  WBC 4.0 - 10.5 K/uL 12.9(H) 12.5(H) 19.8(H)  Hemoglobin 12.0 - 15.0 g/dL 7.4(L) 8.3(L) 8.9(L)  Hematocrit 36.0 - 46.0 % 23.7(L) 27.5(L) 28.9(L)  Platelets 150 - 400 K/uL 214 228 263    CMP Latest Ref Rng & Units 11/29/2021 11/28/2021 11/27/2021  Glucose 70 - 99 mg/dL 244(H) 146(H) 191(H)  BUN 8 - 23 mg/dL 163(H) 147(H) 146(H)  Creatinine 0.44 - 1.00 mg/dL 3.49(H) 2.85(H) 2.75(H)  Sodium 135  - 145 mmol/L 134(L) 138 138  Potassium 3.5 - 5.1 mmol/L 4.3 3.8 3.8  Chloride 98 - 111 mmol/L 95(L) 97(L) 98  CO2 22 - 32 mmol/L 24 30 27   Calcium 8.9 - 10.3 mg/dL 10.0 10.7(H) 10.9(H)  Total Protein 6.5 - 8.1 g/dL - - 6.7  Total Bilirubin 0.3 - 1.2 mg/dL - - 0.8  Alkaline Phos 38 - 126 U/L - - 50  AST 15 - 41 U/L - - 18  ALT 0 - 44 U/L - - 13     ASSESSMENT/PLAN:   Assessment:  #Acute Hypoxic Respiratory Failure 2/2 pulmonary edema #Possible COPD exacerbation  # lung nodules Patient had been reporting shortness of breath and was found to have pulmonary edema on CXR. She improved with IV lasix.  - She  uses Symbicort at home and was started on Breo, duonebs, and prednisone for suspected COPD exacerbation. Breathing status has improved since initiation of breathing treatments as well. She is breathing comfortably and satting well on room air.   -- Continue breo (equivalent to home symbicort)  -- Scheduled duonebs q4h while awake  -- Pred 40x 5 days   #LLL nodule and RML nodule LLL nodule noted on CT imaging of abd. Initially noted on CT scan 2020. Due to its enlarging nature, nodule was felt to be malignant and further further evaluated with dedicated chest CT which demonstrated the LLL nodule had greatly enlarged from 2020 concerning for primary lung malignancy.  Additionally, a new right middle lobe nodule seen on imaging, possible post-infectious/inflammatory was also observed and will require follow up to exclude metastasis.  - PCCM has been consulted for possible Bx of nodules  #CKD stage 5 approaching ESRD with evidence of uremia and volume overload. - after lengthy [place] discussions, patient is agreeable to dialysis. Nephro and VVS are following patient. BUN and Cr have risen again since yesterday; she will have TDC and AVG placement for initiation of HD this hospitalization. - Avoid nephrotoxic drugs and dose medications per GFR. She has neuropathic pain in her legs and can be  given small doses 100mg  Gabapentin as needed for a maximum of 300mg  daily. For more severe pain, dilaudid can be given.  - Continue Sevelamer for her hyperphosphatemia  - Strict I&Os, daily weights, telemetry   #Acute on Chronic HFmrEF Patient's echo 02/2021.  EF noted to be 45 to 50% at that time.  There was no wall motion abnormalities.  She was noted to have left ventricular hypertrophy, no valvulopathy, left atrium was noted to be severely dilated. ECHO shows mild worsening of EF to 40-45% in setting of worsening uremia.  Patient is on torsemide milligrams twice daily per EMR as well as metolazone 3 times a week. Her Home diuretics are being held for now. She can likely resume her home regimen upon discharge.     - Strict I&Os - Daily weights   IV infiltration vs superficial thrombosis vs cellulitis of left AC fossa - vascular US obtained showing superficial thrombus as well as DVT in ulnar vein. Superficial thrombus able to be managed with warm compresses. More distal DVT has low probability of embolizing and can be observed at this time.  Cannot exclude possibility of cellulitis given leukocytosis, though she has been afebrile which is more suggestive of reactive leukocytosis. Will continue on abx for superficial cellulitis at this time. Bcx collected and these will need to be negative effectively ruling out thrombophlebitis prior to proceeding with vascular access for HD. Fortunately, blood cultures have been negative thus far.  - BCX negative 2 days - Continue abx, she is day 4/5  #Chronic Pain, Chronic use of opioids - has not been receiving her home oxycodone here. On review of PDMP, she receives #120 tablets of percocet 10-325 a month and fills regularly. With her progressive CKD, we have added oral dilaudid PRN rather than resuming her home oxy.  - Oral dilaudid PRN    #Constipation Generalized abdominal pain. Imaging revealed large stool burden. - miralax and senna daily     #T2DM Patient's last A1c was 02/2021 and noted to be 6.8.  Per EMR she takes 75/25 insulin 40 units in the a.m. and 30 units in the evening as well as Trulicity 1.5 mg weekly.  Patient is also on Reglan with 3 times daily dosing. -We will start patient on sliding scale insulin and continue her home Reglan.  Will uptitrate as necessary.  #Renal mass  Patient noted to have interval enlargement of L kidney exophytic density (now 3.2 cm) compared to 02/2021 imaging - patient underwent MRI imaging of this mass. 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 Patient was noted to be significantly iron  deficient on labs 09/2021 with a ferritin of 11 and a transferrin saturation of 7%.  She did receive IV iron in the past for this most recently, Feraheme 10/04/2021. - she has been started on IV iron. Will plan to follow daily CBC  Signature: Delene Ruffini, MD  Internal Medicine Resident, PGY-1 [facility]  Pager: [phone] 2:05 PM, 11/29/2021   Please contact the on call pager after 5 pm and on weekends at [phone].

## 2021-11-29 NOTE — Telephone Encounter (Signed)
PCCM:  Please schedule PET scan.   Please schedule appt with me after PET Scan  Ok to use held slot or any available slot in clinic after PET is complete.   Thanks  Garner Nash, DO [place] [facility] 11/29/2021 4:28 PM

## 2021-11-29 NOTE — Consult Note (Signed)
NAME:  [name] [name], MRN:  [id number], DOB:  [birth year], LOS: 6 ADMISSION DATE:  11/21/2021, CONSULTATION DATE:  11/29/21 REFERRING MD: Graciella Freer  , CHIEF COMPLAINT:  LLL nodule, pre syncope   History of Present Illness:  [name] [name] is a 71 y.o. F with CKD stage V, HFrEF, COPD, HL, HTN, hypothyroidism, TIA, known LLL nodule previously under observation who presented on 2/14 after an episode of pre-syncope and AMS after straining on the toilet.    Initial work-up was significant for worsening renal function. She was admitted and diuresed, however developed worsening volume overload.  Chest CT noted 2.8 x 1.8 cm LLL nodule enlarging from last CT scan therefore PCCM consulted.  She has been followed by nephrology and is initiating iHD today  LLL nodule first noted in 2020, 8x30mm.  She saw Dr. Elsworth Soho in the outpatient pulmonary setting and underwent negative PET scan.  She was to follow up after 2020.  She notes lack of appetite for the last few months with unintentional weight loss of over >50lbs along with increasing dyspnea on exertion.  No chest pain, hemoptysis or night sweats.  She is still smoking about 1/4 ppd.      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   Significant Hospital Events: Including procedures, antibiotic start and stop dates in  addition to other pertinent events   2/14 Admit for pre-syncope work-up 2/22 Renal function not recovered and starting iHD, PCCM consult for LLL nodule  Interim History / Subjective:  Pt reports feeling "ok"  somewhat short of breath  Objective   Blood pressure (!) 148/69, pulse 92, temperature 98 F (36.7 C), temperature source Oral, resp. rate 16, height 5\' 3"  (1.6 m), weight 97.2 kg, SpO2 93 %.        Intake/Output Summary (Last 24 hours) at 11/29/2021 1153 Last data filed at 11/29/2021 1116 Gross per 24 hour  Intake 220 ml  Output 1100 ml  Net -880 ml   Filed Weights   11/23/21 1507 11/29/21 0500  Weight: 93.3 kg 97.2 kg   General: Overweight female sitting up in chair comfortable HEENT: Mucous membranes moist, tracking appropriately Neuro: Awake alert following commands Heart: Regular rhythm S1-S2 Lungs: Clear to auscultation bilaterally no crackles no wheeze Abdomen: Soft nontender nondistended Extremities: 2+ edema   Resolved Hospital Problem list     Assessment & Plan:    LLL Pulmonary nodule in the setting of continued tobacco use and baseline COPD, new 85mm RML nodule   Increased in size from 8 mm x 12 mm in 2020 to 2.8 x 1.8 cm this admission  PET scan negative in 2020  Plan: Discussed recommendations with the patient. I think she needs a nuclear medicine PET scan prior to procedure for possible biopsy. Patient is agreeable to follow-up in the outpatient setting. We will go ahead and order the PET scan and have this scheduled and her see me in hospital follow-up to make plans regarding bronchoscopy. I will place  orders and arrange follow up as outpatient with me in clinic.   Best Practice (right click and "Reselect all SmartList Selections" daily)   Per primary   Labs   CBC: Recent Labs  Lab 11/25/21 0110 11/26/21 1147 11/27/21 0148 11/28/21 0311 11/29/21 0313  WBC 8.7 21.0* 19.8* 12.5* 12.9*  NEUTROABS  --  19.4*  --   --   --   HGB 7.1* 8.8*  8.9* 8.3* 7.4*  HCT 23.6* 28.8* 28.9* 27.5* 23.7*  MCV 87.4 87.5 87.3 89.6 86.5  PLT 252 282 263 228 010    Basic Metabolic Panel: Recent Labs  Lab 11/24/21 0738 11/25/21 0110 11/26/21 0122 11/27/21 0148 11/28/21 0311 11/28/21 1516 11/29/21 0313  NA 139 141 139 138 138  --  134*  K 3.7 4.0 4.1 3.8 3.8  --  4.3  CL 98 98 98 98 97*  --  95*  CO2 28 30 27 27 30   --  24  GLUCOSE 125* 154* 240* 191* 146*  --  244*  BUN 171* 169* 157* 146* 147*  --  163*  CREATININE 3.03* 2.98* 2.94* 2.75* 2.85*  --  3.49*  CALCIUM 9.5 10.0 10.1 10.9* 10.7*  --  10.0  MG  --   --   --   --   --  1.5*  --   PHOS 5.0* 4.3  --   --  4.8*  --  3.9   GFR: Estimated Creatinine Clearance: 16.4 mL/min (A) (by C-G formula based on SCr of 3.49 mg/dL (H)). Recent Labs  Lab 11/26/21 1147 11/27/21 0148 11/28/21 0311 11/29/21 0313  WBC 21.0* 19.8* 12.5* 12.9*    Liver Function Tests: Recent Labs  Lab 11/23/21 0130 11/24/21 0738 11/25/21 0110 11/27/21 0148 11/28/21 0311 11/29/21 0313  AST 18  --  18 18  --   --   ALT 12  --  9 13  --   --   ALKPHOS 50  --  45 50  --   --   BILITOT 0.3  --  0.2* 0.8  --   --   PROT 5.5*  --  6.3* 6.7  --   --   ALBUMIN 2.5* 3.1* 3.6 3.7 3.1* 3.0*   No results for input(s): LIPASE, AMYLASE in the last 168 hours. No results for input(s): AMMONIA in the last 168 hours.  ABG    Component Value Date/Time   HCO3 32.1 (H) 02/15/2021 1312   TCO2 35 (H) 04/05/2020 1125   O2SAT 75.5 02/15/2021 1312     Coagulation Profile: No results for input(s): INR, PROTIME in the last 168 hours.  Cardiac Enzymes: No results for input(s): CKTOTAL, CKMB, CKMBINDEX, TROPONINI in the last 168 hours.  HbA1C: Hgb A1c MFr Bld  Date/Time Value Ref Range Status  11/22/2021 03:01 AM 6.3 (H) 4.8 - 5.6 % Final    Comment:    (NOTE) Pre diabetes:          5.7%-6.4%  Diabetes:              >6.4%  Glycemic control for   <7.0% adults with diabetes   02/15/2021 08:23 PM 6.8 (H)  4.8 - 5.6 % Final    Comment:    (NOTE) Pre diabetes:          5.7%-6.4%  Diabetes:              >6.4%  Glycemic control for   <7.0% adults with diabetes     CBG:  Recent Labs  Lab 11/28/21 1153 11/28/21 1640 11/28/21 2139 11/29/21 0802 11/29/21 1132  GLUCAP 192* 343* 287* 195* 162*    Review of Systems:   Please see the history of present illness. All other systems reviewed and are negative    Past Medical History:  She,  has a past medical history of Anemia, Arthritis, Asthma, Chronic diastolic (congestive) heart failure ([place]), Chronic kidney disease, Chronic pain syndrome, Class 2 obesity due to excess calories with body mass index (BMI) of 36.0 to 36.9 in adult, COPD (chronic obstructive pulmonary disease) ([place]), Diabetes mellitus without complication ([place]), Full dentures, GERD (gastroesophageal reflux disease), Gout, History of hiatal hernia, History of transfusion, MRSA infection, Hyperlipidemia, Hypertension, Hypothyroid, 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   Surgical History:   Past Surgical History:  Procedure Laterality Date   ABDOMINAL HYSTERECTOMY     BACK SURGERY  6962   BASCILIC VEIN TRANSPOSITION Left 04/05/2020   Procedure: BASILIC VEIN TRANSPOSITION FIRST STAGE;  Surgeon: Angelia Mould, MD;  Location: [facility];  Service: Vascular;  Laterality: Left;   COLON SURGERY  1995   DUE TO POLYP   DILATION AND CURETTAGE OF UTERUS     ENTEROSCOPY N/A 07/10/2021   Procedure: ENTEROSCOPY;  Surgeon: Ronnette Juniper, MD;  Location: [facility];  Service: Gastroenterology;  Laterality: N/A;   ESOPHAGOGASTRODUODENOSCOPY N/A 01/25/2021   Procedure: ESOPHAGOGASTRODUODENOSCOPY (EGD);  Surgeon: Clarene Essex, MD;  Location: [facility];  Service: Endoscopy;  Laterality: N/A;   FOREARM FRACTURE SURGERY     Left arm   GIVENS CAPSULE STUDY N/A 07/07/2021    Procedure: GIVENS CAPSULE STUDY;  Surgeon: Clarene Essex, MD;  Location: [facility];  Service: Endoscopy;  Laterality: N/A;   HERNIA REPAIR     w/ mesh   HOT HEMOSTASIS N/A 07/10/2021   Procedure: HOT HEMOSTASIS (ARGON PLASMA COAGULATION/BICAP);  Surgeon: Ronnette Juniper, MD;  Location: [facility];  Service: Gastroenterology;  Laterality: N/A;   INCISION AND DRAINAGE ABSCESS Left 02/04/2013   Procedure: INCISION AND DRAINAGE LEFT BUTTOCK ABSCESS; INCISION AND DRAINAGE LEFT BREAST ABSCESS;  Surgeon: Harl Bowie, MD;  Location: [facility];  Service: General;  Laterality: Left;   INCISION AND DRAINAGE ABSCESS N/A 02/12/2013   Procedure: INCISION AND DEBRIDEMENT BUTTOCK WOUND ;  Surgeon: [name]. Georgette Dover, MD;  Location: [facility];  Service: General;  Laterality: N/A;   INCISION AND DRAINAGE ABSCESS N/A 02/14/2013   Procedure: INCISION AND DRAINAGE/DRESSING CHANGE;  Surgeon: Harl Bowie, MD;  Location: [facility];  Service: General;  Laterality: N/A;   INCISION AND DRAINAGE ABSCESS N/A 03/21/2015   Procedure: INCISION AND DRAINAGE PUBIC ABSCESS;  Surgeon: Excell Seltzer, MD;  Location: [facility];  Service: General;  Laterality: N/A;   INCISION AND DRAINAGE PERIRECTAL ABSCESS Left 02/10/2013   Procedure: IRRIGATION AND DEBRIDEMENT OF BUTTOCK/PERINEAL ABSCESS;  Surgeon: [name]. Georgette Dover, MD;  Location: [facility];  Service: General;  Laterality: Left;   INCISION AND DRAINAGE PERIRECTAL ABSCESS N/A 02/16/2013   Procedure: IRRIGATION AND DEBRIDEMENT PERINEAL ABSCESS;  Surgeon: Zenovia Jarred, MD;  Location: [facility] [place];  Service: General;  Laterality: N/A;   IRRIGATION AND DEBRIDEMENT ABSCESS N/A 02/06/2013   Procedure: IRRIGATION AND DEBRIDEMENT BUTTOCK ABSCESS AND DRESSING CHANGE;  Surgeon: Harl Bowie, MD;  Location: [facility];  Service: General;  Laterality: N/A;   IRRIGATION AND DEBRIDEMENT ABSCESS Left 02/08/2013   Procedure: IRRIGATION AND DEBRIDEMENT ABSCESS/DRESSING CHANGE;  Surgeon: Jeneen Rinks  Michael Boston, MD;  Location: [facility];  Service: General;  Laterality: Left;   JOINT REPLACEMENT  2010 / 2012    [place] N/A 07/02/2014   Procedure: LEFT HEART CATHETERIZATION WITH CORONARY ANGIOGRAM;  Surgeon: Lorretta Harp, MD;  Location: [facility];  Service: Cardiovascular;  Laterality: N/A;   LOWER EXTREMITY ANGIOGRAM Right 04/20/2016   Procedure: Lower Extremity Angiogram;  Surgeon: Elam Dutch, MD;  Location: [facility];  Service: Cardiovascular;  Laterality: Right;   MULTIPLE TOOTH EXTRACTIONS     PERIPHERAL VASCULAR CATHETERIZATION N/A 04/20/2016   Procedure: Abdominal Aortogram;  Surgeon: Elam Dutch, MD;  Location: [facility] [facility];  Service: Cardiovascular;  Laterality: N/A;   PERIPHERAL VASCULAR CATHETERIZATION Right 04/20/2016   Procedure: Peripheral Vascular Intervention;  Surgeon: Elam Dutch, MD;  Location: [facility];  Service: Cardiovascular;  Laterality: Right;  [place]   RIGHT HEART CATH N/A 10/27/2018   Procedure: RIGHT HEART CATH;  Surgeon: Larey Dresser, MD;  Location: [facility];  Service: Cardiovascular;  Laterality: N/A;   RIGHT HEART CATH N/A 03/20/2019   Procedure: RIGHT HEART CATH;  Surgeon: Larey Dresser, MD;  Location: [facility];  Service: Cardiovascular;  Laterality: N/A;   THYROIDECTOMY     TOTAL HIP REVISION Right 08/11/2014   Procedure: RIGHT ACETABULAR REVISION;  Surgeon: Gearlean Alf, MD;  Location: [facility];  Service: Orthopedics;  Laterality: Right;   VASCULAR SURGERY       Social History:   reports that she has been smoking cigarettes. She has a 10.00 pack-year smoking history. She has never used smokeless tobacco. She reports current alcohol use of about 1.0 standard drink per week. She reports that she does not use drugs.   Family History:  Her family history includes Cancer in her father; Cardiomyopathy in her mother; Diabetes in her brother; Heart  disease in her mother.   Allergies Allergies  Allergen Reactions   Nebivolol Swelling    Chest pain (reaction to Bystolic)   Ace Inhibitors Swelling and Other (See Comments)    Tongue swell   Morphine And Related Itching     Home Medications  Prior to Admission medications   Medication Sig Start Date End Date Taking? Authorizing Provider  allopurinol (ZYLOPRIM) 100 MG tablet Take 1 tablet (100 mg total) by mouth daily. 03/05/21  Yes Medina-Vargas, Monina C, NP  B Complex Vitamins (B COMPLEX PO) Take 1 mL by mouth daily. B Complex 1.7mg -20mg -2mg -1.2mg /ml sublingual liquid   Yes [provider]  budesonide-formoterol (SYMBICORT) 160-4.5 MCG/ACT inhaler Inhale 2 puffs into the lungs 3 (three) times a week.   Yes [provider]  carvedilol (COREG) 12.5 MG tablet Take 1 tablet (12.5 mg total) by mouth 2 (two) times daily. 03/05/21  Yes Medina-Vargas, Monina C, NP  cinacalcet (SENSIPAR) 30 MG tablet Take 1 tablet (30 mg total) by mouth daily. 03/05/21  Yes Medina-Vargas, Monina C, NP  clopidogrel (PLAVIX) 75 MG tablet Take 75 mg by mouth daily.   Yes [provider]  diclofenac Sodium (VOLTAREN) 1 % GEL Apply 1 application topically 4 (four) times daily as needed (pain). 08/15/20  Yes [provider]  hydrOXYzine (ATARAX/VISTARIL) 25 MG tablet Take 1 tablet (25 mg total) by mouth 2 (two) times daily as needed for anxiety. Patient taking differently: Take 25 mg by mouth 2 (two) times daily as needed for itching.  03/05/21  Yes Medina-Vargas, Monina C, NP  insulin lispro protamine-lispro (HUMALOG 75/25 MIX) (75-25) 100 UNIT/ML SUSP injection Inject 60 Units into the skin in the morning and at bedtime.   Yes [provider]  levothyroxine (SYNTHROID) 200 MCG tablet Take 1 tablet (200 mcg total) by mouth daily at 6 (six) AM. 03/05/21  Yes Medina-Vargas, Monina C, NP  lubiprostone (AMITIZA) 24 MCG capsule Take 24 mcg by mouth 2 (two) times daily. 06/06/21  Yes  [provider]  magnesium oxide (MAG-OX) 400 (241.3 Mg) MG tablet Take 1 tablet (400 mg total) by mouth 2 (two) times daily. Patient taking differently: Take 400 mg by mouth daily. 01/14/20  Yes Lavina Hamman, MD  melatonin 3 MG TABS tablet Take 3 mg by mouth at bedtime.   Yes [provider]  metolazone (ZAROXOLYN) 5 MG tablet Take 1 tablet (5 mg total) by mouth 3 (three) times a week. Takes 5mg  on Monday, Wednesday and Friday Patient taking differently: Take 5 mg by mouth every Monday, Wednesday, and Friday. 03/06/21  Yes Medina-Vargas, Monina C, NP  mirabegron ER (MYRBETRIQ) 50 MG TB24 tablet Take 50 mg by mouth daily.   Yes [provider]  Multiple Vitamin (MULTIVITAMIN WITH MINERALS) TABS tablet Take 1 tablet by mouth daily.   Yes [provider]  oxyCODONE-acetaminophen (PERCOCET) 10-325 MG tablet Take 1 tablet by mouth 4 (four) times daily as needed for pain. 07/10/21  Yes Amin, Ankit Chirag, MD  pantoprazole (PROTONIX) 40 MG tablet Take 1 tablet (40 mg total) by mouth 2 (two) times daily before a meal. Patient taking differently: Take 40 mg by mouth daily. 07/10/21  Yes Amin, Jeanella Flattery, MD  Potassium Chloride ER 20 MEQ TBCR Take 1 tablet by mouth 3 (three) times a week. Tuesday, Thursdays and Saturdays Patient taking differently: Take 20 mEq by mouth See admin instructions. 62mEq daily on Tuesday's, Thursday's, and Saturday's 03/06/21  Yes Medina-Vargas, Monina C, NP  torsemide (DEMADEX) 100 MG tablet Take 1 tablet (100 mg total) by mouth 2 (two) times daily. 03/05/21  Yes Medina-Vargas, Monina C, NP  TRULICITY 1.30 QM/5.7QI SOPN Inject 0.75 mg into the skin once a week. 10/11/21  Yes [provider]  polyethylene glycol (MIRALAX / GLYCOLAX) 17 g packet Take 17 g by mouth daily. Patient not taking: Reported on 11/24/2021 07/10/21   Damita Lack, MD  senna-docusate (SENOKOT-S) 8.6-50 MG tablet Take 1 tablet by mouth at bedtime as needed for  moderate constipation. Patient not taking: Reported on 11/24/2021 07/10/21   Damita Lack, MD  TRULICITY 1.5 ON/6.2XB SOPN Inject 1.5 mg into the skin once a week. Monday Patient not taking: Reported on 11/24/2021 03/05/21   Medina-Vargas, Senaida Lange, NP     Garner Nash, DO [facility] 11/29/2021 4:25 PM

## 2021-11-29 NOTE — Progress Notes (Signed)
Patient ID: [name] [name], female   DOB: 10-Jan-1951, 71 y.o.   MRN: [id number] S: Not feeling well today.  "I feel rough".  Still with SOB but no N/V. O:BP (!) 148/69 (BP Location: Right Arm)    Pulse 92    Temp 98 F (36.7 C) (Oral)    Resp 16    Ht 5\' 3"  (1.6 m)    Wt 97.2 kg    SpO2 93%    BMI 37.96 kg/m   Intake/Output Summary (Last 24 hours) at 11/29/2021 1023 Last data filed at 11/29/2021 0900 Gross per 24 hour  Intake 220 ml  Output 1000 ml  Net -780 ml   Intake/Output: I/O last 3 completed shifts: In: 480 [P.O.:480] Out: 1100 [Urine:1100]  Intake/Output this shift:  Total I/O In: 220 [P.O.:220] Out: -  Weight change:  PZW:CHENIDPOEUM ill-appearing, sitting up eating breakfast CVS:RRR Resp: CTA Abd: +BS, soft, NT/ND Ext:no edema, area of erythema and induration of RUE  Recent Labs  Lab 11/23/21 0130 11/24/21 0738 11/25/21 0110 11/26/21 0122 11/27/21 0148 11/28/21 0311 11/29/21 0313  NA 137 139 141 139 138 138 134*  K 3.4* 3.7 4.0 4.1 3.8 3.8 4.3  CL 96* 98 98 98 98 97* 95*  CO2 27 28 30 27 27 30 24   GLUCOSE 199* 125* 154* 240* 191* 146* 244*  BUN 189* 171* 169* 157* 146* 147* 163*  CREATININE 3.42* 3.03* 2.98* 2.94* 2.75* 2.85* 3.49*  ALBUMIN 2.5* 3.1* 3.6  --  3.7 3.1* 3.0*  CALCIUM 9.2 9.5 10.0 10.1 10.9* 10.7* 10.0  PHOS  --  5.0* 4.3  --   --  4.8* 3.9  AST 18  --  18  --  18  --   --   ALT 12  --  9  --  13  --   --    Liver Function Tests: Recent Labs  Lab 11/23/21 0130 11/24/21 0738 11/25/21 0110 11/27/21 0148 11/28/21 0311 11/29/21 0313  AST 18  --  18 18  --   --   ALT 12  --  9 13  --   --   ALKPHOS 50  --  45 50  --   --   BILITOT 0.3  --  0.2* 0.8  --   --   PROT 5.5*  --  6.3* 6.7  --   --   ALBUMIN 2.5*   < > 3.6 3.7 3.1* 3.0*   < > = values in this interval not displayed.   No results for input(s): LIPASE, AMYLASE in the last 168 hours. No results for input(s): AMMONIA in the last 168 hours. CBC: Recent Labs  Lab  11/25/21 0110 11/26/21 1147 11/27/21 0148 11/28/21 0311 11/29/21 0313  WBC 8.7 21.0* 19.8* 12.5* 12.9*  NEUTROABS  --  19.4*  --   --   --   HGB 7.1* 8.8* 8.9* 8.3* 7.4*  HCT 23.6* 28.8* 28.9* 27.5* 23.7*  MCV 87.4 87.5 87.3 89.6 86.5  PLT 252 282 263 228 214   Cardiac Enzymes: No results for input(s): CKTOTAL, CKMB, CKMBINDEX, TROPONINI in the last 168 hours. CBG: Recent Labs  Lab 11/28/21 0742 11/28/21 1153 11/28/21 1640 11/28/21 2139 11/29/21 0802  GLUCAP 284* 192* 343* 287* 195*    Iron Studies: No results for input(s): IRON, TIBC, TRANSFERRIN, FERRITIN in the last 72 hours. Studies/Results: CT CHEST WO CONTRAST  Result Date: 11/28/2021 CLINICAL DATA:  Left lower lobe lung nodules. EXAM: CT CHEST WITHOUT CONTRAST TECHNIQUE:  Multidetector CT imaging of the chest was performed following the standard protocol without IV contrast. RADIATION DOSE REDUCTION: This exam was performed according to the departmental dose-optimization program which includes automated exposure control, adjustment of the mA and/or kV according to patient size and/or use of iterative reconstruction technique. COMPARISON:  CT chest 08/10/2019. CT abdomen and pelvis 02/15/2021. MRI abdomen 11/24/2021. FINDINGS: Cardiovascular: Thoracic aortic atherosclerosis without aneurysm. LAD and left circumflex coronary atherosclerosis. Cardiomegaly. No pericardial effusion. Mediastinum/Nodes: No enlarged axillary, mediastinal, or hilar lymph nodes are identified, however hilar assessment is limited in the absence of IV contrast. Unremarkable esophagus. Lungs/Pleura: Trace right pleural effusion. Mild dependent atelectasis in the lower lobes. As seen on the recent abdominal CT, there is a 2.8 x 1.8 cm nodule in the left lower lobe which has greatly enlarged from 2020 when it measured 1.2 cm (series 8, image 83). There is associated mild spiculation, and band like densities extend from the nodule posterolaterally to the pleura  and anteriorly to the major fissure which is mildly retracted. A 5 mm nodule in the right middle lobe is new (series 8, image 89). 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 Upper Abdomen: Chronic bilateral adrenal enlargement. Partially visualized left renal lesions, more fully characterized on the recent abdominal MRI. Musculoskeletal: Old bilateral rib fractures. Small sclerotic focus in the T3 vertebral body, unchanged from 2020 and possibly a bone island. Moderate thoracic spondylosis. IMPRESSION: 1. 2.8 x 1.8 cm left lower lobe nodule which has greatly enlarged from 2020 and is most concerning for primary lung malignancy. 2. New 5 mm right middle lobe nodule, indeterminate though potentially post-infectious/inflammatory. Attention on follow-up to exclude a metastasis. 3. Trace right pleural effusion. 4. Aortic Atherosclerosis (ICD10-I70.0). Electronically Signed   By: Logan Bores M.D.   On: 11/28/2021 14:52   VAS US DUPLEX DIALYSIS ACCESS (AVF, AVG)  Result Date: 11/28/2021 DIALYSIS ACCESS Patient Name:  [name] [name]  Date of Exam:   11/27/2021 Medical Rec #: [id number]          Accession #:    [id number] Date of Birth: 05/31/1951           Patient Gender: F Patient Age:   51 years Exam Location:  [facility] Procedure:      VAS US DUPLEX DIALYSIS ACCESS (AVF, AVG) Referring Phys: Monica Martinez --------------------------------------------------------------------------------  Reason for Exam: Non-maturation of AVF. Access Site: Left Upper Extremity. Access Type: Basilic vein transposition. History: 04-05-2020 Left first stage basilic vein transposition, slow to mature          AVF last seen on 06-30-2020. Limitations: Patient somnolence, patient attempting to retract arm multiple              times during examination, second tech required to hold arm in              position, size and depth of vessels. Performing Technologist: Darlin Coco RDMS, RVT Supporting  Technologist: Rogelia Rohrer RVT, RDMS  Examination Guidelines: A complete evaluation includes B-mode imaging, spectral Doppler, color Doppler, and power Doppler as needed of all accessible portions of each vessel. Unilateral testing is considered an integral part of a complete examination. Limited examinations for reoccurring indications may be performed as noted.  Findings: +--------------------+----------+-----------------+--------+  AVF                  PSV (cm/s) Flow Vol (mL/min) Comments  +--------------------+----------+-----------------+--------+  Native artery inflow     82                                 +--------------------+----------+-----------------+--------+  +------------+----------+-------------+----------+-----------------------------+  OUTFLOW VEIN PSV (cm/s) Diameter (cm) Depth (cm)           Describe             +------------+----------+-------------+----------+-----------------------------+  Prox UA                                               Phasic, venous flow       +------------+----------+-------------+----------+-----------------------------+  Mid UA                                                Phasic, venous flow       +------------+----------+-------------+----------+-----------------------------+  Dist UA                                           Not well visualized due to                                                               vessel size           +------------+----------+-------------+----------+-----------------------------+  [facility] Fossa                                          Not well visualized due to                                                               vessel size           +------------+----------+-------------+----------+-----------------------------+  Prox Forearm                                      Not well visualized due to                                                               vessel size            +------------+----------+-------------+----------+-----------------------------+  Mid Forearm                                       Not well visualized due to  vessel size           +------------+----------+-------------+----------+-----------------------------+  Dist Forearm                                      Not well visualized due to                                                               vessel size           +------------+----------+-------------+----------+-----------------------------+  Diagnosing physician: Servando Snare MD Electronically signed by Servando Snare MD on 11/28/2021 at 11:16:07 AM.   --------------------------------------------------------------------------------   Final    VAS [name] UPPER EXT VEIN MAPPING (PRE-OP AVF)  Result Date: 11/27/2021 UPPER EXTREMITY VEIN MAPPING Patient Name:  [name] [name]  Date of Exam:   11/27/2021 Medical Rec #: [id number]          Accession #:    [id number] Date of Birth: [birth year]           Patient Gender: F Patient Age:   46 years Exam Location:  [facility] Procedure:      VAS [name] UPPER EXT VEIN MAPPING (PRE-OP AVF) Referring Phys: Monica Martinez --------------------------------------------------------------------------------  Indications: Pre-access. History: 04-05-2020 Left first stage basilic vein transposition, slow to mature          AVF last seen on 06-30-2020.  Limitations: Patient somnolence, patient attempting to retract arm multiple              times during examination, size and depth of vessels. Performing Technologist: Darlin Coco RDMS, RVT  Examination Guidelines: A complete evaluation includes B-mode imaging, spectral Doppler, color Doppler, and power Doppler as needed of all accessible portions of each vessel. Bilateral testing is considered an integral part of a complete examination. Limited examinations for reoccurring indications may be performed as noted.  +-----------------+-------------+----------+--------------+  Left Cephalic     Diameter (cm) Depth (cm)    Findings     +-----------------+-------------+----------+--------------+  Shoulder              0.24         1.40                    +-----------------+-------------+----------+--------------+  Prox upper arm        0.16         0.67                    +-----------------+-------------+----------+--------------+  Mid upper arm                              not visualized  +-----------------+-------------+----------+--------------+  Dist upper arm                             not visualized  +-----------------+-------------+----------+--------------+  Antecubital fossa                          not visualized  +-----------------+-------------+----------+--------------+  Prox forearm          0.12  0.90                    +-----------------+-------------+----------+--------------+  Mid forearm           0.16         0.76                    +-----------------+-------------+----------+--------------+  Dist forearm          0.08         0.12                    +-----------------+-------------+----------+--------------+ *See table(s) above for measurements and observations.  Diagnosing physician: Monica Martinez MD Electronically signed by Monica Martinez MD on 11/27/2021 at 2:38:18 PM.    Final     amoxicillin-clavulanate  1 tablet Oral BID   carvedilol  12.5 mg Oral BID   fluticasone furoate-vilanterol  1 puff Inhalation Daily   heparin  5,000 Units Subcutaneous Q8H   hydrALAZINE  50 mg Oral Q8H   insulin aspart  0-9 Units Subcutaneous TID WC   insulin aspart  5 Units Subcutaneous TID WC   insulin glargine-yfgn  15 Units Subcutaneous Daily   ipratropium-albuterol  3 mL Nebulization TID   levothyroxine  200 mcg Oral Q0600   metoCLOPramide  5 mg Oral TID   pantoprazole  40 mg Oral QHS   predniSONE  20 mg Oral Q breakfast   sevelamer carbonate  800 mg Oral TID WC    BMET    Component Value  Date/Time   NA 134 (L) 11/29/2021 0313   NA 146 (H) 12/23/2013 1132   K 4.3 11/29/2021 0313   CL 95 (L) 11/29/2021 0313   CO2 24 11/29/2021 0313   GLUCOSE 244 (H) 11/29/2021 0313   BUN 163 (H) 11/29/2021 0313   BUN 31 (H) 12/23/2013 1132   CREATININE 3.49 (H) 11/29/2021 0313   CREATININE 3.51 (HH) 10/17/2020 1234   CREATININE 1.32 (H) 06/29/2014 1100   CALCIUM 10.0 11/29/2021 0313   CALCIUM 9.1 09/07/2021 1036   GFRNONAA 13 (L) 11/29/2021 0313   GFRNONAA 13 (L) 10/17/2020 1234   GFRAA 15 (L) 04/22/2020 1129   CBC    Component Value Date/Time   WBC 12.9 (H) 11/29/2021 0313   RBC 2.74 (L) 11/29/2021 0313   HGB 7.4 (L) 11/29/2021 0313   HCT 23.7 (L) 11/29/2021 0313   HCT 24.6 (L) 10/17/2020 1235   PLT 214 11/29/2021 0313   MCV 86.5 11/29/2021 0313   MCH 27.0 11/29/2021 0313   MCHC 31.2 11/29/2021 0313   RDW 21.1 (H) 11/29/2021 0313   RDW 15.7 (H) 12/23/2013 1132   LYMPHSABS 0.5 (L) 11/26/2021 1147   LYMPHSABS 2.5 12/23/2013 1132   MONOABS 0.9 11/26/2021 1147   EOSABS 0.1 11/26/2021 1147   EOSABS 0.1 12/23/2013 1132   BASOSABS 0.0 11/26/2021 1147   BASOSABS 0.0 12/23/2013 1132    Assessment/Plan:   Non-Oliguric AKI/azotemia: Creatinine is back to baseline and BUN improving daily.  However, BUN fairly high.  Unclear what caused BUN elevation to be so high out of proportion to creatinine.  She has not demonstrated signs of uremia thus far although had been confused over the weekend. I suspect this is delirium and not uremia given the BUN has continued to trend down  BUN/Cr significantly increased over the past 24 hours.  Discussed worsening renal function and she has agreed to proceed with HD catheter placement and initiation of dialysis within  the next 24 hours. Continue to monitor daily Cr, Dose meds for GFR which is now <15 ml/min Monitor Daily I/Os, Daily weight  Maintain MAP>65 for optimal renal perfusion. Avoid nephrotoxic medications including NSAIDs, IV Contrast and  phosphate containing bowel preps (FLEETS)  Use synthetic opioids (Fentanyl/Dilaudid) if needed Will ask Dr. Carlis Abbott to proceed with AVG placement. Congestive heart failure: EF around 40 to 59% with diastolic dysfunction.  Lower extremity edema persistent but also may be multifactorial.  Holding diuresis  Vascular access: Would recommend placement of AVG during this hospitalization and tunneled HD catheter by IR today or tomorrow morning.  Left lower Lung nodule - concerning for malignancy and PET scan was recommended.  CT scan of chest showed a new right middle lobe nodule as well concerning for mets COPD - longtime smoker.  Continue with nebs per primary.  Likely etiology of SOB.   Hyperphosphatemia: Sevelamer  Hypercalemia - not on vitamin D or calcium supplements, likely due to immobility.  Ambulate as tolerated but may also be due to malignancy as above.  Anemia of CKD: Iron deficiency.  IV iron ordered.  Monitor hemoglobin trend.  Hold off on ESA for now given possible lung cancer. 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  Follow-up 3 to 6 months. HTN: Coreg 12.5 mg twice daily.  Blood pressure goal Right lower extremity edema: Predominantly on the right side.  Had PVL of the right leg on 2/15 that was negative for DVT.  Improved.    Donetta Potts, MD [employer] [phone]

## 2021-11-29 NOTE — Progress Notes (Signed)
Daily Progress Note   Patient Name: [name] [name]       Date: 11/29/2021 DOB: Nov 08, 1950  Age: 71 y.o. MRN#: [id number] Attending Physician: [name] Ochs, MD Primary Care Physician: [name] Ebbs, MD Admit Date: 11/21/2021  Reason for Consultation/Follow-up: Establishing goals of care  Patient Profile/HPI:  71 y.o. female  with past medical history of COPD, CKD stage V, diastolic CHF (EF 40 to 36%), OSA, gout, iron deficient anemia, low back pain, lower extremity venous insufficiency with left lower extremity wound, HTN, TIA, and type 2 diabetes admitted on 11/21/2021 with syncope after straining on the toilet with abdominal pain, vomiting, and constipation.  Etiology of syncopal episode remains unclear.     CT of abdomen reveals exophytic soft tissue on the left kidney.  CT of the chest reveals left lower lobe nodule (2.4 cm).  Imaging suspect for malignancy.   Subjective: Chart reviewed including progress notes, labs, imaging.  Her renal function is worsening and plan now is to initiate HD after placement of tunneled catheter tomorrow. She had a repeat CT scan that continues to show lung nodules concerning for malignancy.  [name] [name] is awake and alert today. Sitting up in chair. Sister working on her hair.  [name] [name] shares her anxiety about starting dialysis. She doesn't know what to expect. She has a friend who has been on dialysis and she tried to reach out to her but hasn't been able to speak with her.  She is concerned about still being constipated- had a little BM after enema- but no further BM in the last 3 days and this is uncomfortable.  She has not been sleeping, she is anxious, cannot fall asleep.  I also spoke with her spouseGwenlyn [name]. We discussed [name]'s plan of care. I also  discussed her CT results with him.  We discussed the need for [name] [name] and [name] [name] to continue to discuss with their medical team regarding her goals for care. Currently, their goals are to keep [place] home with a good quality of life. We discussed there may come a time when continuing HD and other aggressive medical interventions may not be doing that for [facility] and consideration of comfort care and Hospice would be appropriate at that time.   Review of Systems  Constitutional:  Positive for malaise/fatigue. Negative for fever.  Gastrointestinal:  Positive  for constipation. Negative for nausea and vomiting.  Psychiatric/Behavioral:  The patient has insomnia.     Physical Exam Vitals and nursing note reviewed.  Cardiovascular:     Rate and Rhythm: Normal rate.  Pulmonary:     Effort: Pulmonary effort is normal.  Abdominal:     General: There is no distension.     Palpations: Abdomen is soft.  Neurological:     Mental Status: She is alert and oriented to person, place, and time.  Psychiatric:        Thought Content: Thought content normal.        Judgment: Judgment normal.            Vital Signs: BP (!) 149/79 (BP Location: Right Arm)    Pulse 90    Temp 98.1 F (36.7 C) (Oral)    Resp 17    Ht 5\' 3"  (1.6 m)    Wt 97.2 kg    SpO2 99%    BMI 37.96 kg/m  SpO2: SpO2: 99 % O2 Device: O2 Device: Room Air O2 Flow Rate: O2 Flow Rate (L/min): 2 L/min  Intake/output summary:  Intake/Output Summary (Last 24 hours) at 11/29/2021 1621 Last data filed at 11/29/2021 1400 Gross per 24 hour  Intake 400 ml  Output 1100 ml  Net -700 ml    LBM: Last BM Date : 11/25/21 Baseline Weight: Weight: 93.3 kg Most recent weight: Weight: 97.2 kg       Palliative Assessment/Data: PPS: 50%      Patient Active Problem List   Diagnosis Date Noted   Acute respiratory failure with hypoxia (HCC)    Labored breathing    Kidney lesion, native, left 11/24/2021   CKD (chronic kidney disease) stage 5,  GFR less than 15 ml/min (HCC) 11/23/2021   Syncope 11/21/2021   Chronic diastolic (congestive) heart failure (HCC) 07/01/2021   Chronic pain syndrome 07/01/2021   COPD exacerbation ([place]) 07/01/2021   Cellulitis of leg, right 07/01/2021   SARS-CoV-2 positive 02/21/2021   Increased weakness when ambulating 02/17/2021   Intractable nausea and vomiting 02/15/2021   Symptomatic anemia 01/24/2021   Anemia, unspecified 01/08/2020   Bacteremia 01/07/2020   UTI (urinary tract infection) 01/06/2020   Anemia 10/26/2019   S/P lumbar fusion 09/18/2019   Chronic CHF (congestive heart failure) ([place]) 03/16/2019   Leukocytosis 03/16/2019   Chest pain 11/27/2018   Pre-syncope 11/27/2018   Epigastric abdominal pain 11/27/2018   Solitary pulmonary nodule 11/06/2018   Hypertensive heart and renal disease, stage 1-4 or unspecified chronic kidney disease, with heart failure ([place]) 10/20/2018   CHF (congestive heart failure), NYHA class III, acute on chronic, systolic (HCC) 23/55/7322   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, unspecified extremity ([place]) 04/19/2016   Cellulitis of abdominal wall 03/21/2015   Acute on chronic renal failure ([place]) 03/21/2015   DM (diabetes mellitus), type 2 with renal complications ([place]) 02/54/2706   Abdominal wall abscess 03/21/2015   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   Low urine output 08/11/2014   Acute on chronic systolic CHF (congestive heart failure), NYHA class 3 ([place]) 08/11/2014   Asthma, chronic 08/11/2014   Hypothyroidism 08/11/2014   OSA (obstructive sleep apnea) 08/11/2014   Positive cardiac stress test 06/29/2014   Hyperlipidemia 02/12/2014   Left arm weakness 12/23/2013   TIA (transient ischemic attack) 12/23/2013   Left buttock abscess 10/12/2013   Cellulitis and abscess of leg, right 04/16/2013   Acute blood loss anemia 02/14/2013   HTN (  hypertension) 02/14/2013   Cellulitis and abscess of buttock 02/09/2013    Class 2 obesity due to excess calories with body mass index (BMI) of 36.0 to 36.9 in adult 02/03/2013   Diarrhea 02/03/2013   Hyponatremia 02/03/2013   Sepsis due to undetermined organism ([place]) 02/02/2013   Diabetes mellitus without complication ([place]) 35/32/9924   CKD (chronic kidney disease), stage IV ([place]) 02/02/2013   Cellulitis 02/02/2013   PAD (peripheral artery disease) ([place]) 04/26/2011   Tobacco abuse 04/26/2011    Palliative Care Assessment & Plan    Assessment/Recommendations/Plan  CKD5 in the setting of CHF and COPD- GOC are for continued life prolonging interventions- she is hoping to be discharged soon and be able to participate in activities with [facility], willing to do HD - plan now [facility] followed by HD and vascular surgery on Friday Lung nodules on CT- plan for PET outpatient and f/u after discharge Constipation- recommend Miralax x 2 doses, today and tomorrow, start Senna 2po nightly- if no results after 2 miralax doses then recommend dulcolax suppository Insomnia- trazadone 50mg  nightly - this will also assist with anxiety/depression related to her health state     Code Status: Full code  Prognosis:  Unable to determine  Discharge Planning: Home with [place] was discussed with patient and family.   Thank you for allowing the Palliative Medicine Team to assist in the care of this patient.   Mariana Kaufman, AGNP-C Palliative Medicine   Please contact Palliative Medicine Team phone at [phone] for questions and concerns.

## 2021-11-29 NOTE — Consult Note (Signed)
Chief Complaint: Patient was seen in consultation today for  Chief Complaint  Patient presents with   Abdominal Pain   Seizures    Referring Physician(s): Dr. Marval Regal   Supervising Physician: Mir, Sharen Heck  Patient Status: [facility] - In-pt  History of Present Illness: [name] [name] is a 71 y.o. female with a medical history significant for obesity, CHF, CKD stage 4-5 (followed by Nephrology), DM2, HTN and peripheral vascular disease. She presented to the ED 11/21/21 with complaints of severe abdominal pain with vomiting. She has been constipated and had a syncopal episode while attempting to have a bowel movement and the nursing home called EMS. Labs showed elevated BUN, hyperkalemia and anemia.  Imaging obtained in the ED unfortunately revealed findings concerning for possible lung cancer. A right upper arm DVT has also been identified during this hospitalization.     Since admission her renal function has worsened and the nephrology team is preparing her for hemodialysis. Vascular surgery has evaluated this patient and she has undergone left arm vein mapping. Interventional Radiology has been asked to evaluate this patient for an image-guided tunneled dialysis catheter.   Past Medical History:  Diagnosis Date   Anemia    Arthritis    Asthma    Chronic diastolic (congestive) heart failure (HCC)    Chronic kidney disease    Chronic pain syndrome    Class 2 obesity due to excess calories with body mass index (BMI) of 36.0 to 36.9 in adult    COPD (chronic obstructive pulmonary disease) (HCC)    Diabetes mellitus without complication (HCC)    Full dentures    GERD (gastroesophageal reflux disease)    Gout    History of hiatal hernia    History of transfusion    Hx MRSA infection    abscess left groin   Hyperlipidemia    Hypertension    Hypothyroid    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HCC)    Peripheral vascular disease (HCC)    Sleep apnea    UNABLE TO TOLERATE C PAP    Stress incontinence    TIA (transient ischemic attack)     X2 NO RESIDUAL PROBLEMS   Wears glasses     Past Surgical History:  Procedure Laterality Date   ABDOMINAL HYSTERECTOMY     BACK SURGERY  9470   BASCILIC VEIN TRANSPOSITION Left 04/05/2020   Procedure: BASILIC VEIN TRANSPOSITION FIRST STAGE;  Surgeon: Angelia Mould, MD;  Location: [place];  Service: Vascular;  Laterality: Left;   COLON SURGERY  1995   DUE TO POLYP   DILATION AND CURETTAGE OF UTERUS     ENTEROSCOPY N/A 07/10/2021   Procedure: ENTEROSCOPY;  Surgeon: Ronnette Juniper, MD;  Location: [facility] ENDOSCOPY;  Service: Gastroenterology;  Laterality: N/A;   ESOPHAGOGASTRODUODENOSCOPY N/A 01/25/2021   Procedure: ESOPHAGOGASTRODUODENOSCOPY (EGD);  Surgeon: Clarene Essex, MD;  Location: [facility];  Service: Endoscopy;  Laterality: N/A;   FOREARM FRACTURE SURGERY     Left arm   GIVENS CAPSULE STUDY N/A 07/07/2021   Procedure: GIVENS CAPSULE STUDY;  Surgeon: Clarene Essex, MD;  Location: [place];  Service: Endoscopy;  Laterality: N/A;   HERNIA REPAIR     w/ mesh   HOT HEMOSTASIS N/A 07/10/2021   Procedure: HOT HEMOSTASIS (ARGON PLASMA COAGULATION/BICAP);  Surgeon: Ronnette Juniper, MD;  Location: [place];  Service: Gastroenterology;  Laterality: N/A;   INCISION AND DRAINAGE ABSCESS Left 02/04/2013   Procedure: INCISION AND DRAINAGE LEFT BUTTOCK ABSCESS; INCISION AND DRAINAGE LEFT BREAST ABSCESS;  Surgeon: Harl Bowie, MD;  Location: [place];  Service: General;  Laterality: Left;   INCISION AND DRAINAGE ABSCESS N/A 02/12/2013   Procedure: INCISION AND DEBRIDEMENT BUTTOCK WOUND ;  Surgeon: [name]. Georgette Dover, MD;  Location: [place];  Service: General;  Laterality: N/A;   INCISION AND DRAINAGE ABSCESS N/A 02/14/2013   Procedure: INCISION AND DRAINAGE/DRESSING CHANGE;  Surgeon: Harl Bowie, MD;  Location: [place];  Service: General;  Laterality: N/A;   INCISION AND DRAINAGE ABSCESS N/A 03/21/2015   Procedure: INCISION AND DRAINAGE  PUBIC ABSCESS;  Surgeon: Excell Seltzer, MD;  Location: [facility];  Service: General;  Laterality: N/A;   INCISION AND DRAINAGE PERIRECTAL ABSCESS Left 02/10/2013   Procedure: IRRIGATION AND DEBRIDEMENT OF BUTTOCK/PERINEAL ABSCESS;  Surgeon: [name]. Georgette Dover, MD;  Location: [place];  Service: General;  Laterality: Left;   INCISION AND DRAINAGE PERIRECTAL ABSCESS N/A 02/16/2013   Procedure: IRRIGATION AND DEBRIDEMENT PERINEAL ABSCESS;  Surgeon: Zenovia Jarred, MD;  Location: [place];  Service: General;  Laterality: N/A;   IRRIGATION AND DEBRIDEMENT ABSCESS N/A 02/06/2013   Procedure: IRRIGATION AND DEBRIDEMENT BUTTOCK ABSCESS AND DRESSING CHANGE;  Surgeon: Harl Bowie, MD;  Location: [place];  Service: General;  Laterality: N/A;   IRRIGATION AND DEBRIDEMENT ABSCESS Left 02/08/2013   Procedure: IRRIGATION AND DEBRIDEMENT ABSCESS/DRESSING CHANGE;  Surgeon: Gwenyth Ober, MD;  Location: [place];  Service: General;  Laterality: Left;   JOINT REPLACEMENT  2010 / 2012    [place] N/A 07/02/2014   Procedure: LEFT HEART CATHETERIZATION WITH CORONARY ANGIOGRAM;  Surgeon: Lorretta Harp, MD;  Location: [facility] CATH LAB;  Service: Cardiovascular;  Laterality: N/A;   LOWER EXTREMITY ANGIOGRAM Right 04/20/2016   Procedure: Lower Extremity Angiogram;  Surgeon: Elam Dutch, MD;  Location: [place] CV LAB;  Service: Cardiovascular;  Laterality: Right;   MULTIPLE TOOTH EXTRACTIONS     PERIPHERAL VASCULAR CATHETERIZATION N/A 04/20/2016   Procedure: Abdominal Aortogram;  Surgeon: Elam Dutch, MD;  Location: [place] CV LAB;  Service: Cardiovascular;  Laterality: N/A;   PERIPHERAL VASCULAR CATHETERIZATION Right 04/20/2016   Procedure: Peripheral Vascular Intervention;  Surgeon: Elam Dutch, MD;  Location: [place] CV LAB;  Service: Cardiovascular;  Laterality: Right;  popiteal   RIGHT HEART CATH N/A 10/27/2018   Procedure: RIGHT  HEART CATH;  Surgeon: Larey Dresser, MD;  Location: [place] CV LAB;  Service: Cardiovascular;  Laterality: N/A;   RIGHT HEART CATH N/A 03/20/2019   Procedure: RIGHT HEART CATH;  Surgeon: Larey Dresser, MD;  Location: [place] CV LAB;  Service: Cardiovascular;  Laterality: N/A;   THYROIDECTOMY     TOTAL HIP REVISION Right 08/11/2014   Procedure: RIGHT ACETABULAR REVISION;  Surgeon: Gearlean Alf, MD;  Location: [facility];  Service: Orthopedics;  Laterality: Right;   VASCULAR SURGERY      Allergies: Nebivolol, Ace inhibitors, and Morphine and related  Medications: Prior to Admission medications   Medication Sig Start Date End Date Taking? Authorizing Provider  allopurinol (ZYLOPRIM) 100 MG tablet Take 1 tablet (100 mg total) by mouth daily. 03/05/21  Yes Medina-Vargas, Monina C, NP  B Complex Vitamins (B COMPLEX PO) Take 1 mL by mouth daily. B Complex 1.7mg -20mg -2mg -1.2mg /ml sublingual liquid   Yes [provider]  budesonide-formoterol (SYMBICORT) 160-4.5 MCG/ACT inhaler Inhale 2 puffs into the lungs 3 (three) times a week.   Yes [provider]  carvedilol (COREG)  12.5 MG tablet Take 1 tablet (12.5 mg total) by mouth 2 (two) times daily. 03/05/21  Yes Medina-Vargas, Monina C, NP  cinacalcet (SENSIPAR) 30 MG tablet Take 1 tablet (30 mg total) by mouth daily. 03/05/21  Yes Medina-Vargas, Monina C, NP  clopidogrel (PLAVIX) 75 MG tablet Take 75 mg by mouth daily.   Yes [provider]  diclofenac Sodium (VOLTAREN) 1 % GEL Apply 1 application topically 4 (four) times daily as needed (pain). 08/15/20  Yes [provider]  hydrOXYzine (ATARAX/VISTARIL) 25 MG tablet Take 1 tablet (25 mg total) by mouth 2 (two) times daily as needed for anxiety. Patient taking differently: Take 25 mg by mouth 2 (two) times daily as needed for itching. 03/05/21  Yes Medina-Vargas, Monina C, NP  insulin lispro protamine-lispro (HUMALOG 75/25 MIX) (75-25) 100 UNIT/ML SUSP  injection Inject 60 Units into the skin in the morning and at bedtime.   Yes [provider]  levothyroxine (SYNTHROID) 200 MCG tablet Take 1 tablet (200 mcg total) by mouth daily at 6 (six) AM. 03/05/21  Yes Medina-Vargas, Monina C, NP  lubiprostone (AMITIZA) 24 MCG capsule Take 24 mcg by mouth 2 (two) times daily. 06/06/21  Yes [provider]  magnesium oxide (MAG-OX) 400 (241.3 Mg) MG tablet Take 1 tablet (400 mg total) by mouth 2 (two) times daily. Patient taking differently: Take 400 mg by mouth daily. 01/14/20  Yes Lavina Hamman, MD  melatonin 3 MG TABS tablet Take 3 mg by mouth at bedtime.   Yes [provider]  metolazone (ZAROXOLYN) 5 MG tablet Take 1 tablet (5 mg total) by mouth 3 (three) times a week. Takes 5mg  on Monday, Wednesday and Friday Patient taking differently: Take 5 mg by mouth every Monday, Wednesday, and Friday. 03/06/21  Yes Medina-Vargas, Monina C, NP  mirabegron ER (MYRBETRIQ) 50 MG TB24 tablet Take 50 mg by mouth daily.   Yes [provider]  Multiple Vitamin (MULTIVITAMIN WITH MINERALS) TABS tablet Take 1 tablet by mouth daily.   Yes [provider]  oxyCODONE-acetaminophen (PERCOCET) 10-325 MG tablet Take 1 tablet by mouth 4 (four) times daily as needed for pain. 07/10/21  Yes Amin, Ankit Chirag, MD  pantoprazole (PROTONIX) 40 MG tablet Take 1 tablet (40 mg total) by mouth 2 (two) times daily before a meal. Patient taking differently: Take 40 mg by mouth daily. 07/10/21  Yes Amin, Jeanella Flattery, MD  Potassium Chloride ER 20 MEQ TBCR Take 1 tablet by mouth 3 (three) times a week. Tuesday, Thursdays and Saturdays Patient taking differently: Take 20 mEq by mouth See admin instructions. 27mEq daily on Tuesday's, Thursday's, and Saturday's 03/06/21  Yes Medina-Vargas, Monina C, NP  torsemide (DEMADEX) 100 MG tablet Take 1 tablet (100 mg total) by mouth 2 (two) times daily. 03/05/21  Yes Medina-Vargas, Monina C, NP  TRULICITY 9.38 HW/2.9HB  SOPN Inject 0.75 mg into the skin once a week. 10/11/21  Yes [provider]  polyethylene glycol (MIRALAX / GLYCOLAX) 17 g packet Take 17 g by mouth daily. Patient not taking: Reported on 11/24/2021 07/10/21   Damita Lack, MD  senna-docusate (SENOKOT-S) 8.6-50 MG tablet Take 1 tablet by mouth at bedtime as needed for moderate constipation. Patient not taking: Reported on 11/24/2021 07/10/21   Damita Lack, MD  TRULICITY 1.5 ZJ/6.9CV SOPN Inject 1.5 mg into the skin once a week. Monday Patient not taking: Reported on 11/24/2021 03/05/21   Medina-Vargas, Senaida Lange, NP     Family History  Problem Relation Age of Onset   Diabetes Brother    Cardiomyopathy Mother    Heart disease Mother    Cancer Father     Social History   Socioeconomic History   Marital status: Legally Separated    Spouse name: Not on file   Number of children: 1   Years of education: college   Highest education level: Not on file  Occupational History    Comment: Disabled  Tobacco Use   Smoking status: Some Days    Packs/day: 0.25    Years: 40.00    Pack years: 10.00    Types: Cigarettes   Smokeless tobacco: Never  Vaping Use   Vaping Use: Never used  Substance and Sexual Activity   Alcohol use: Yes    Alcohol/week: 1.0 standard drink    Types: 1 Standard drinks or equivalent per week    Comment: OCC   Drug use: No   Sexual activity: Not Currently  Other Topics Concern   Not on file  Social History Narrative   ** Merged History Encounter **       Patient is separated  and lives at home with her friend. Patient is disabled. Education one year of college Right handed Caffeine two cups daily.     Social Determinants of Health   Financial Resource Strain: Not on file  Food Insecurity: Not on file  Transportation Needs: Not on file  Physical Activity: Not on file  Stress: Not on file  Social Connections: Not on file    Review of Systems: A 12 point ROS discussed and pertinent  positives are indicated in the HPI above.  All other systems are negative.  Review of Systems  Constitutional:  Positive for appetite change and fatigue.  Respiratory:  Negative for cough and shortness of breath.   Cardiovascular:  Positive for leg swelling. Negative for chest pain.  Gastrointestinal:  Positive for abdominal pain and diarrhea. Negative for nausea and vomiting.  Neurological:  Negative for dizziness and headaches.   Vital Signs: BP (!) 148/69 (BP Location: Right Arm)    Pulse 92    Temp 98 F (36.7 C) (Oral)    Resp 16    Ht 5\' 3"  (1.6 m)    Wt 214 lb 4.6 oz (97.2 kg)    SpO2 93%    BMI 37.96 kg/m   Physical Exam Constitutional:      General: She is not in acute distress.    Appearance: She is not ill-appearing.  HENT:     Mouth/Throat:     Mouth: Mucous membranes are moist.     Pharynx: Oropharynx is clear.  Cardiovascular:     Rate and Rhythm: Normal rate and regular rhythm.     Pulses: Normal pulses.     Heart sounds: Normal heart sounds.     Comments: Right upper arm DVT. Antecubital area is firm/tender. Left arm vein mapping recently done; pending graft creation with Vascular.  Pulmonary:     Effort: Pulmonary effort is normal.     Breath sounds: Normal breath sounds.  Abdominal:     General: Bowel sounds are normal.     Palpations: Abdomen is soft.     Tenderness: There is no abdominal tenderness.  Musculoskeletal:     Right lower leg: Edema present.     Left lower leg: Edema present.  Skin:    General: Skin is warm and dry.  Neurological:     Mental Status: She is alert and oriented  to person, place, and time.    Imaging: CT Abdomen Pelvis Wo Contrast  Result Date: 11/21/2021 CLINICAL DATA:  Acute generalized abdominal pain. EXAM: CT ABDOMEN AND PELVIS WITHOUT CONTRAST TECHNIQUE: Multidetector CT imaging of the abdomen and pelvis was performed following the standard protocol without IV contrast. RADIATION DOSE REDUCTION: This exam was performed  according to the departmental dose-optimization program which includes automated exposure control, adjustment of the mA and/or kV according to patient size and/or use of iterative reconstruction technique. COMPARISON:  Feb 15, 2021. FINDINGS: Lower chest: 2 left lower lobe nodules noted on prior exam have significantly enlarged. The largest measures 2.4 x 1.8 cm. This is highly concerning for malignancy and PET scan is recommended for further evaluation. Hepatobiliary: Status post cholecystectomy. No biliary dilatation is noted. No definite hepatic abnormality is noted. Pancreas: Unremarkable. No pancreatic ductal dilatation or surrounding inflammatory changes. Spleen: Normal in size without focal abnormality. Adrenals/Urinary Tract: Stable bilateral adrenal gland enlargement. Bilateral renal cysts are noted. Interval enlargement of 3.2 cm exophytic density arising from lower pole of left kidney concerning for neoplasm or malignancy. No hydronephrosis or renal obstruction is noted. Urinary bladder is unremarkable. Stomach/Bowel: The stomach appears normal. There is no evidence of bowel obstruction or inflammation. Stool seen throughout the colon. Vascular/Lymphatic: Aortic atherosclerosis. No enlarged abdominal or pelvic lymph nodes. Reproductive: Status post hysterectomy. No adnexal masses. Other: No abdominal wall hernia or abnormality. No abdominopelvic ascites. Musculoskeletal: Status post right total hip arthroplasty. No definite acute osseous abnormality is noted. IMPRESSION: Interval large mint of 3.2 cm exophytic soft tissue abnormality arising from lower pole of left kidney concerning for renal cell carcinoma. Further evaluation with MRI is recommended. Two left lower lobe nodules are noted which are significantly enlarged compared to prior exam, the largest measuring 2.4 cm. This is concerning for malignancy or metastatic disease. PET scan is recommended for further evaluation. Stable bilateral adrenal  gland enlargement is noted. Aortic Atherosclerosis (ICD10-I70.0). Electronically Signed   By: Marijo Conception M.D.   On: 11/21/2021 15:20   DG Chest 2 View  Result Date: 11/21/2021 CLINICAL DATA:  A 71 year old female presents for evaluation of weakness, possible seizure. EXAM: CHEST - 2 VIEW COMPARISON:  Comparison made with July 04, 2021. FINDINGS: Exam limited by portable technique and patient body habitus. Heart size is enlarged and stable compared to previous imaging. Central pulmonary vascular engorgement without frank edema. Nodular density in the LEFT lower chest measuring up to 2 cm. This is in the LEFT retrocardiac region. No lobar consolidation. No sign of pneumothorax or of pleural effusion. On limited assessment there is no acute skeletal process. IMPRESSION: 1. Nodular density in the LEFT retrocardiac region measuring up to 2 cm. Suggest chest CT for further evaluation. 2. Stable marked cardiomegaly with central pulmonary vascular engorgement but no frank edema. Electronically Signed   By: Zetta Bills M.D.   On: 11/21/2021 14:49   CT Head Wo Contrast  Result Date: 11/21/2021 CLINICAL DATA:  Head trauma.  Weakness.  Possible syncopal event. EXAM: CT HEAD WITHOUT CONTRAST TECHNIQUE: Contiguous axial images were obtained from the base of the skull through the vertex without intravenous contrast. RADIATION DOSE REDUCTION: This exam was performed according to the departmental dose-optimization program which includes automated exposure control, adjustment of the mA and/or kV according to patient size and/or use of iterative reconstruction technique. COMPARISON:  Head CT 03/16/2019 FINDINGS: Brain: There is no evidence of an acute infarct, intracranial hemorrhage, mass, midline shift, or extra-axial fluid  collection. 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 Hypodensities in the cerebral white matter bilaterally are unchanged and nonspecific but compatible with mild chronic small vessel  ischemic disease. Mild cerebral atrophy is within normal limits for age. Vascular: Calcified atherosclerosis at the skull base. Skull: No acute fracture or suspicious osseous lesion. Sinuses/Orbits: Visualized paranasal sinuses and mastoid air cells are clear. Bilateral cataract extraction. Other: None. IMPRESSION: 1. No evidence of acute intracranial abnormality. 2. Mild chronic small vessel ischemic disease. Interval chronic right basal ganglia infarct. Electronically Signed   By: Logan Bores M.D.   On: 11/21/2021 15:27   CT CHEST WO CONTRAST  Result Date: 11/28/2021 CLINICAL DATA:  Left lower lobe lung nodules. EXAM: CT CHEST WITHOUT CONTRAST TECHNIQUE: Multidetector CT imaging of the chest was performed following the standard protocol without IV contrast. RADIATION DOSE REDUCTION: This exam was performed according to the departmental dose-optimization program which includes automated exposure control, adjustment of the mA and/or kV according to patient size and/or use of iterative reconstruction technique. COMPARISON:  CT chest 08/10/2019. CT abdomen and pelvis 02/15/2021. MRI abdomen 11/24/2021. FINDINGS: Cardiovascular: Thoracic aortic atherosclerosis without aneurysm. LAD and left circumflex coronary atherosclerosis. Cardiomegaly. No pericardial effusion. Mediastinum/Nodes: No enlarged axillary, mediastinal, or hilar lymph nodes are identified, however hilar assessment is limited in the absence of IV contrast. Unremarkable esophagus. Lungs/Pleura: Trace right pleural effusion. Mild dependent atelectasis in the lower lobes. As seen on the recent abdominal CT, there is a 2.8 x 1.8 cm nodule in the left lower lobe which has greatly enlarged from 2020 when it measured 1.2 cm (series 8, image 83). There is associated mild spiculation, and band like densities extend from the nodule posterolaterally to the pleura and anteriorly to the major fissure which is mildly retracted. A 5 mm nodule in the right middle  lobe is new (series 8, image 89). 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 Upper Abdomen: Chronic bilateral adrenal enlargement. Partially visualized left renal lesions, more fully characterized on the recent abdominal MRI. Musculoskeletal: Old bilateral rib fractures. Small sclerotic focus in the T3 vertebral body, unchanged from 2020 and possibly a bone island. Moderate thoracic spondylosis. IMPRESSION: 1. 2.8 x 1.8 cm left lower lobe nodule which has greatly enlarged from 2020 and is most concerning for primary lung malignancy. 2. New 5 mm right middle lobe nodule, indeterminate though potentially post-infectious/inflammatory. Attention on follow-up to exclude a metastasis. 3. Trace right pleural effusion. 4. Aortic Atherosclerosis (ICD10-I70.0). Electronically Signed   By: Logan Bores M.D.   On: 11/28/2021 14:52   MR ABDOMEN WO CONTRAST  Result Date: 11/24/2021 CLINICAL DATA:  Bilateral kidney cysts. Evaluate suspicious lesion arising off the lower pole of left kidney. EXAM: MRI ABDOMEN WITHOUT CONTRAST TECHNIQUE: Multiplanar multisequence MR imaging was performed without the administration of intravenous contrast. COMPARISON:  11/24/2021. FINDINGS: Exam detail is diminished secondary to motion artifact. Lower chest: Small bilateral pleural effusions noted. Nodule within the left lower lobe is again seen measuring approximately 2.2 cm. Hepatobiliary: There is diffuse low signal throughout the liver on the inphase sequences which increases on the out of phase sequences. This finding may be seen in the setting of iron deposition disease as well as certain medications. No focal liver lesion identified. Previous cholecystectomy. No biliary dilatation. The common bile duct measures upper limits of normal at 6 mm, image 24/5. Pancreas:  No main duct dilatation, inflammation or mass identified. Spleen:  Spleen is unremarkable. Adrenals/Urinary Tract: There is diffuse  motion artifact  which diminishes exam detail and assessment of these lesions. Furthermore evaluation of kidney lesions is limited due to lack of intravenous contrast material. There are multiple bilateral kidney cysts. Most of these are T2 hyperintense and T1 hypointense. There also several T1 hyperintense kidney lesions compatible with hemorrhagic cysts. The recently characterized enlarging lesion off the inferior pole of the left kidney measures 3 cm, image 36/5. This lesion exhibits intrinsic increased T1 signal with a peripheral rim of low T1 and T2 signal compatible with a hemorrhagic lesion. Several additional T1 hyperintense kidney lesions are also noted bilaterally compatible with hemorrhagic cysts. No hydronephrosis identified bilaterally. Bilateral perinephric soft tissue stranding is again noted which may be related to prior insult. Stomach/Bowel: Stomach appears normal. No dilated loops of large or small bowel. Vascular/Lymphatic: Aortic atherosclerosis without aneurysm. No signs of abdominopelvic adenopathy. Other:  None. Musculoskeletal: Postop changes identified within the lower lumbar spine from previous posterior hardware fixation. IMPRESSION: 1. Exam detail is diminished secondary to motion artifact and lack of intravenous contrast material. 2. The recently characterized enlarging lesion off the inferior pole of the left kidney is again seen and has signal characteristics compatible with a hemorrhagic lesion. There are additional bilateral kidney lesions which likely reflect a combination of simple cysts and hemorrhagic cysts. Although these remain incompletely characterized without IV contrast material these lesions do not require emergent attention at this time. Consider follow-up imaging in 3-6 months with repeat CT or MRI of the kidneys without and with contrast material. In a patient who has heart failure and may not be able to remain motionless and breath hold a CT may be more appropriate. 3. Low signal  throughout the liver on the in-phase sequences which may be seen in the setting of iron deposition disease as well as certain medications. 4. Small bilateral pleural effusions. Electronically Signed   By: Kerby Moors M.D.   On: 11/24/2021 08:32   DG CHEST PORT 1 VIEW  Result Date: 11/26/2021 CLINICAL DATA:  Syncope with labored breathing. EXAM: PORTABLE CHEST 1 VIEW COMPARISON:  11/21/2021 FINDINGS: 1104 hours. The cardio pericardial silhouette is enlarged. There is pulmonary vascular congestion without overt pulmonary edema. Diffuse interstitial opacity suggest edema. Bones are diffusely demineralized. Telemetry leads overlie the chest. IMPRESSION: Cardiomegaly with interstitial pulmonary edema pattern. Electronically Signed   By: Misty Stanley M.D.   On: 11/26/2021 12:11   EEG adult  Result Date: 11/22/2021 Greta Doom, MD     11/22/2021  1:17 PM History: 71 yo F being evaluated for syncope. Sedation: None Technique: This EEG was acquired with electrodes placed according to the International 10-20 electrode system (including Fp1, Fp2, F3, F4, C3, C4, P3, P4, O1, O2, T3, T4, T5, T6, A1, A2, Fz, Cz, Pz). The following electrodes were missing or displaced: none. Background: There is a posterior dominant rhythm of 8-9 Hz which is poorly sustained. There are occasional bifrontally predominant discharges with triphasic morphology. There eis diffuse high voltage irregular delta and theta range activity throughout the study. Photic stimulation: Physiologic driving is not performed EEG Abnormalities: 1) Triphasic waves 2) generalized slow activity. Clinical Interpretation: This EEG is consistent with a generalized non-specific cerebral dysfunction(encephalopathy). There was no seizure or seizure predisposition recorded on this study. Please note that lack of epileptiform activity on EEG does not preclude the possibility of epilepsy. Roland Rack, MD [facility] [phone] If 7pm-  7am, please page neurology on call as listed in [place].   VAS US DUPLEX DIALYSIS  ACCESS (AVF, AVG)  Result Date: 11/28/2021 DIALYSIS ACCESS Patient Name:  [name] [name]  Date of Exam:   11/27/2021 Medical Rec #: [id number]          Accession #:    [id number] Date of Birth: [birth year]           Patient Gender: F Patient Age:   34 years Exam Location:  [facility] Procedure:      VAS US DUPLEX DIALYSIS ACCESS (AVF, AVG) Referring Phys: Monica Martinez --------------------------------------------------------------------------------  Reason for Exam: Non-maturation of AVF. Access Site: Left Upper Extremity. Access Type: Basilic vein transposition. History: 04-05-2020 Left first stage basilic vein transposition, slow to mature          AVF last seen on 06-30-2020. Limitations: Patient somnolence, patient attempting to retract arm multiple              times during examination, second tech required to hold arm in              position, size and depth of vessels. Performing Technologist: Darlin Coco RDMS, RVT Supporting Technologist: Rogelia Rohrer RVT, RDMS  Examination Guidelines: A complete evaluation includes B-mode imaging, spectral Doppler, color Doppler, and power Doppler as needed of all accessible portions of each vessel. Unilateral testing is considered an integral part of a complete examination. Limited examinations for reoccurring indications may be performed as noted.  Findings: +--------------------+----------+-----------------+--------+  AVF                  PSV (cm/s) Flow Vol (mL/min) Comments  +--------------------+----------+-----------------+--------+  Native artery inflow     82                                 +--------------------+----------+-----------------+--------+  +------------+----------+-------------+----------+-----------------------------+  OUTFLOW VEIN PSV (cm/s) Diameter (cm) Depth (cm)           Describe              +------------+----------+-------------+----------+-----------------------------+  Prox UA                                               Phasic, venous flow       +------------+----------+-------------+----------+-----------------------------+  Mid UA                                                Phasic, venous flow       +------------+----------+-------------+----------+-----------------------------+  Dist UA                                           Not well visualized due to                                                               vessel size           +------------+----------+-------------+----------+-----------------------------+  [facility]  Not well visualized due to                                                               vessel size           +------------+----------+-------------+----------+-----------------------------+  Prox Forearm                                      Not well visualized due to                                                               vessel size           +------------+----------+-------------+----------+-----------------------------+  Mid Forearm                                       Not well visualized due to                                                               vessel size           +------------+----------+-------------+----------+-----------------------------+  Dist Forearm                                      Not well visualized due to                                                               vessel size           +------------+----------+-------------+----------+-----------------------------+  Diagnosing physician: Servando Snare MD Electronically signed by Servando Snare MD on 11/28/2021 at 11:16:07 AM.   --------------------------------------------------------------------------------   Final    ECHOCARDIOGRAM COMPLETE  Result Date: 11/22/2021    ECHOCARDIOGRAM REPORT   Patient Name:   [name] [name] Date of  Exam: 11/22/2021 Medical Rec #:  [id number]         Height:       67.0 in Accession #:    [id number]        Weight:       198.0 lb Date of Birth:  Jul 06, 1951          BSA:          2.014 m Patient Age:    19 years          BP:           127/56 mmHg Patient Gender: F  HR:           73 bpm. Exam Location:  Inpatient Procedure: 2D Echo, Cardiac Doppler and Color Doppler Indications:    I50.21 CHF  History:        Patient has prior history of Echocardiogram examinations, most                 recent 02/16/2021. CHF, TIA and COPD; Risk Factors:Hypertension,                 Dyslipidemia and Diabetes. GERD.  Sonographer:    Beryle Beams Referring Phys: [place]  1. Left ventricular ejection fraction, by estimation, is 40 to 45%. The left ventricle has mildly decreased function. The left ventricle demonstrates global hypokinesis. The left ventricular internal cavity size was mildly dilated. There is mild left ventricular hypertrophy. Left ventricular diastolic parameters are consistent with Grade I diastolic dysfunction (impaired relaxation).  2. Right ventricular systolic function is normal. The right ventricular size is normal. There is mildly elevated pulmonary artery systolic pressure.  3. Left atrial size was severely dilated.  4. Right atrial size was mildly dilated.  5. The mitral valve is normal in structure. Trivial mitral valve regurgitation. No evidence of mitral stenosis.  6. The aortic valve was not well visualized. There is mild calcification of the aortic valve. Aortic valve regurgitation is not visualized. Mild aortic valve stenosis. Aortic valve area, by VTI measures 1.32 cm. Aortic valve mean gradient measures 8.0 mmHg. Aortic valve Vmax measures 2.05 m/s.  7. The inferior vena cava is dilated in size with >50% respiratory variability, suggesting right atrial pressure of 8 mmHg. FINDINGS  Left Ventricle: Left ventricular ejection fraction, by estimation, is 40 to  45%. The left ventricle has mildly decreased function. The left ventricle demonstrates global hypokinesis. The left ventricular internal cavity size was mildly dilated. There is  mild left ventricular hypertrophy. Left ventricular diastolic parameters are consistent with Grade I diastolic dysfunction (impaired relaxation). Right Ventricle: The right ventricular size is normal. No increase in right ventricular wall thickness. Right ventricular systolic function is normal. There is mildly elevated pulmonary artery systolic pressure. The tricuspid regurgitant velocity is 3.00  m/s, and with an assumed right atrial pressure of 3 mmHg, the estimated right ventricular systolic pressure is 57.8 mmHg. Left Atrium: Left atrial size was severely dilated. Right Atrium: Right atrial size was mildly dilated. Pericardium: There is no evidence of pericardial effusion. Mitral Valve: The mitral valve is normal in structure. There is mild thickening of the mitral valve leaflet(s). Trivial mitral valve regurgitation. No evidence of mitral valve stenosis. MV peak gradient, 35.9 mmHg. The mean mitral valve gradient is 25.5 mmHg. Tricuspid Valve: The tricuspid valve is normal in structure. Tricuspid valve regurgitation is trivial. No evidence of tricuspid stenosis. Aortic Valve: The aortic valve was not well visualized. There is mild calcification of the aortic valve. Aortic valve regurgitation is not visualized. Mild aortic stenosis is present. Aortic valve mean gradient measures 8.0 mmHg. Aortic valve peak gradient measures 16.8 mmHg. Aortic valve area, by VTI measures 1.32 cm. Pulmonic Valve: The pulmonic valve was normal in structure. Pulmonic valve regurgitation is not visualized. No evidence of pulmonic stenosis. Aorta: The aortic root is normal in size and structure. Venous: The inferior vena cava is dilated in size with greater than 50% respiratory variability, suggesting right atrial pressure of 8 mmHg. IAS/Shunts: No atrial  level shunt detected by color flow Doppler.  LEFT VENTRICLE PLAX 2D LVIDd:  5.50 cm      Diastology LVIDs:         4.00 cm      LV e' medial:    8.81 cm/s LV PW:         1.40 cm      LV E/e' medial:  8.3 LV IVS:        1.00 cm      LV e' lateral:   7.51 cm/s LVOT diam:     2.00 cm      LV E/e' lateral: 9.7 LV SV:         51 LV SV Index:   25 LVOT Area:     3.14 cm  LV Volumes (MOD) LV vol d, MOD A2C: 133.0 ml LV vol d, MOD A4C: 105.0 ml LV vol s, MOD A2C: 75.3 ml LV vol s, MOD A4C: 58.5 ml LV SV MOD A2C:     57.7 ml LV SV MOD A4C:     105.0 ml LV SV MOD BP:      55.9 ml RIGHT VENTRICLE             IVC RV S prime:     12.70 cm/s  IVC diam: 2.00 cm TAPSE (M-mode): 1.9 cm LEFT ATRIUM             Index        RIGHT ATRIUM           Index LA diam:        3.80 cm 1.89 cm/m   RA Area:     13.70 cm LA Vol (A2C):   82.3 ml 40.87 ml/m  RA Volume:   33.90 ml  16.84 ml/m LA Vol (A4C):   51.8 ml 25.72 ml/m LA Biplane Vol: 69.1 ml 34.32 ml/m  AORTIC VALVE                     PULMONIC VALVE AV Area (Vmax):    1.07 cm      PV Vmax:       0.94 m/s AV Area (Vmean):   1.08 cm      PV Vmean:      66.000 cm/s AV Area (VTI):     1.32 cm      PV VTI:        0.178 m AV Vmax:           205.00 cm/s   PV Peak grad:  3.5 mmHg AV Vmean:          133.000 cm/s  PV Mean grad:  2.0 mmHg AV VTI:            0.383 m AV Peak Grad:      16.8 mmHg AV Mean Grad:      8.0 mmHg LVOT Vmax:         70.00 cm/s LVOT Vmean:        45.800 cm/s LVOT VTI:          0.161 m LVOT/AV VTI ratio: 0.42  AORTA Ao Root diam: 2.40 cm Ao Asc diam:  2.60 cm MITRAL VALVE                TRICUSPID VALVE MV Area (PHT): 1.70 cm     TR Peak grad:   36.0 mmHg MV Area VTI:   0.59 cm     TR Mean grad:   24.0 mmHg MV Peak grad:  35.9 mmHg    TR Vmax:  300.00 cm/s MV Mean grad:  25.5 mmHg    TR Vmean:       234.0 cm/s MV Vmax:       3.00 m/s MV Vmean:      240.5 cm/s   SHUNTS MV Decel Time: 447 msec     Systemic VTI:  0.16 m MV E velocity: 72.70 cm/s   Systemic  Diam: 2.00 cm MV A velocity: 120.00 cm/s MV E/A ratio:  0.61 Glori Bickers MD Electronically signed by Glori Bickers MD Signature Date/Time: 11/22/2021/6:25:03 PM    Final    VAS [name] LOWER EXTREMITY VENOUS (DVT)  Result Date: 11/22/2021  Lower Venous DVT Study Patient Name:  [name] [name]  Date of Exam:   11/22/2021 Medical Rec #: [id number]          Accession #:    [id number] Date of Birth: [birth year]           Patient Gender: F Patient Age:   46 years Exam Location:  [facility] Procedure:      VAS [name] LOWER EXTREMITY VENOUS (DVT) Referring Phys: Dorian Pod --------------------------------------------------------------------------------  Indications: Pain, swelling RT>LT.  Limitations: Body habitus, acoustic shadowing due to overlying arterial calcification, patient intolerance to probe pressure at some segments. Comparison Study: 07-01-2021 Multiple priors. Most recent prior bilateral lower                   extremity venous was negative for DVT. Performing Technologist: Darlin Coco RDMS, RVT  Examination Guidelines: A complete evaluation includes B-mode imaging, spectral Doppler, color Doppler, and power Doppler as needed of all accessible portions of each vessel. Bilateral testing is considered an integral part of a complete examination. Limited examinations for reoccurring indications may be performed as noted. The reflux portion of the exam is performed with the patient in reverse Trendelenburg.  +---------+---------------+---------+-----------+----------+-------------------+  RIGHT     Compressibility Phasicity Spontaneity Properties Thrombus Aging       +---------+---------------+---------+-----------+----------+-------------------+  CFV       Full            Yes       Yes                                         +---------+---------------+---------+-----------+----------+-------------------+  SFJ       Full                                                                   +---------+---------------+---------+-----------+----------+-------------------+  FV Prox   Full                                                                  +---------+---------------+---------+-----------+----------+-------------------+  FV Mid    Full                                                                  +---------+---------------+---------+-----------+----------+-------------------+  FV Distal Full                                                                  +---------+---------------+---------+-----------+----------+-------------------+  PFV       Full                                                                  +---------+---------------+---------+-----------+----------+-------------------+  POP       Full            Yes       Yes                                         +---------+---------------+---------+-----------+----------+-------------------+  PTV                       Yes       Yes                    Patent by color,                                                                 limited                                                                          visualization        +---------+---------------+---------+-----------+----------+-------------------+  PERO                      Yes       Yes                    Patent by color,                                                                 limited  visualization        +---------+---------------+---------+-----------+----------+-------------------+ Evidence of arterial disease noted on today's examination. Patient with history of PAD and abnormal ABI.  +----+---------------+---------+-----------+----------+--------------+  LEFT Compressibility Phasicity Spontaneity Properties Thrombus Aging  +----+---------------+---------+-----------+----------+--------------+  CFV  Full            Yes       Yes                                     +----+---------------+---------+-----------+----------+--------------+     Summary: RIGHT: - There is no evidence of deep vein thrombosis in the lower extremity. However, portions of this examination were limited- see technologist comments above.  - No cystic structure found in the popliteal fossa.  LEFT: - No evidence of common femoral vein obstruction.  *See table(s) above for measurements and observations. Electronically signed by Harold Barban MD on 11/22/2021 at 7:40:21 PM.    Final    VAS [name] UPPER EXT VEIN MAPPING (PRE-OP AVF)  Result Date: 11/27/2021 UPPER EXTREMITY VEIN MAPPING Patient Name:  [name] [name]  Date of Exam:   11/27/2021 Medical Rec #: [id number]          Accession #:    [id number] Date of Birth: 05-May-1951           Patient Gender: F Patient Age:   24 years Exam Location:  [facility] Procedure:      VAS [name] UPPER EXT VEIN MAPPING (PRE-OP AVF) Referring Phys: Monica Martinez --------------------------------------------------------------------------------  Indications: Pre-access. History: 04-05-2020 Left first stage basilic vein transposition, slow to mature          AVF last seen on 06-30-2020.  Limitations: Patient somnolence, patient attempting to retract arm multiple              times during examination, size and depth of vessels. Performing Technologist: Darlin Coco RDMS, RVT  Examination Guidelines: A complete evaluation includes B-mode imaging, spectral Doppler, color Doppler, and power Doppler as needed of all accessible portions of each vessel. Bilateral testing is considered an integral part of a complete examination. Limited examinations for reoccurring indications may be performed as noted. +-----------------+-------------+----------+--------------+  Left Cephalic     Diameter (cm) Depth (cm)    Findings     +-----------------+-------------+----------+--------------+  Shoulder              0.24         1.40                     +-----------------+-------------+----------+--------------+  Prox upper arm        0.16         0.67                    +-----------------+-------------+----------+--------------+  Mid upper arm                              not visualized  +-----------------+-------------+----------+--------------+  Dist upper arm                             not visualized  +-----------------+-------------+----------+--------------+  Antecubital fossa                          not visualized  +-----------------+-------------+----------+--------------+  Prox forearm  0.12         0.90                    +-----------------+-------------+----------+--------------+  Mid forearm           0.16         0.76                    +-----------------+-------------+----------+--------------+  Dist forearm          0.08         0.12                    +-----------------+-------------+----------+--------------+ *See table(s) above for measurements and observations.  Diagnosing physician: Monica Martinez MD Electronically signed by Monica Martinez MD on 11/27/2021 at 2:38:18 PM.    Final    VAS [name] UPPER EXTREMITY VENOUS DUPLEX  Result Date: 11/27/2021 UPPER VENOUS STUDY  Patient Name:  [name] [name]  Date of Exam:   11/26/2021 Medical Rec #: [id number]          Accession #:    [id number] Date of Birth: 1951-10-08           Patient Gender: F Patient Age:   80 years Exam Location:  [facility] Procedure:      VAS [name] UPPER EXTREMITY VENOUS DUPLEX Referring Phys: Velna Ochs --------------------------------------------------------------------------------  Indications: Pain, and Edema Comparison Study: No prior study Performing Technologist: Maudry Mayhew MHA, RDMS, RVT, RDCS  Examination Guidelines: A complete evaluation includes B-mode imaging, spectral Doppler, color Doppler, and power Doppler as needed of all accessible portions of each vessel. Bilateral testing is considered an integral part of a complete  examination. Limited examinations for reoccurring indications may be performed as noted.  Right Findings: +----------+------------+---------+-----------+----------+-----------------+  RIGHT      Compressible Phasicity Spontaneous Properties      Summary       +----------+------------+---------+-----------+----------+-----------------+  IJV            Full        Yes        Yes                                   +----------+------------+---------+-----------+----------+-----------------+  Subclavian     Full        Yes        Yes                                   +----------+------------+---------+-----------+----------+-----------------+  Axillary       Full        Yes        Yes                                   +----------+------------+---------+-----------+----------+-----------------+  Brachial       Full        Yes        Yes                                   +----------+------------+---------+-----------+----------+-----------------+  Radial         Full                                                         +----------+------------+---------+-----------+----------+-----------------+  Ulnar          None                   No                 Age Indeterminate  +----------+------------+---------+-----------+----------+-----------------+  Cephalic       None                                            Acute        +----------+------------+---------+-----------+----------+-----------------+  Basilic        Full                                                         +----------+------------+---------+-----------+----------+-----------------+  Left Findings: +----------+------------+---------+-----------+----------+-------+  LEFT       Compressible Phasicity Spontaneous Properties Summary  +----------+------------+---------+-----------+----------+-------+  Subclavian                 Yes        Yes                         +----------+------------+---------+-----------+----------+-------+  Summary:  Right: Findings  consistent with acute superficial vein thrombosis involving the right cephalic vein. Findings consistent with age indeterminate deep vein thrombosis involving the right ulnar veins.  Left: No evidence of thrombosis in the subclavian.  *See table(s) above for measurements and observations.  Diagnosing physician: Monica Martinez MD Electronically signed by Monica Martinez MD on 11/27/2021 at 2:36:20 PM.    Final     Labs:  CBC: Recent Labs    11/26/21 1147 11/27/21 0148 11/28/21 0311 11/29/21 0313  WBC 21.0* 19.8* 12.5* 12.9*  HGB 8.8* 8.9* 8.3* 7.4*  HCT 28.8* 28.9* 27.5* 23.7*  PLT 282 263 228 214    COAGS: Recent Labs    01/05/21 1645 07/01/21 0401  INR 1.0 1.0  APTT  --  26    BMP: Recent Labs    11/26/21 0122 11/27/21 0148 11/28/21 0311 11/29/21 0313  NA 139 138 138 134*  K 4.1 3.8 3.8 4.3  CL 98 98 97* 95*  CO2 27 27 30 24   GLUCOSE 240* 191* 146* 244*  BUN 157* 146* 147* 163*  CALCIUM 10.1 10.9* 10.7* 10.0  CREATININE 2.94* 2.75* 2.85* 3.49*  GFRNONAA 17* 18* 17* 13*    LIVER FUNCTION TESTS: Recent Labs    11/21/21 1413 11/23/21 0130 11/24/21 0738 11/25/21 0110 11/27/21 0148 11/28/21 0311 11/29/21 0313  BILITOT 0.5 0.3  --  0.2* 0.8  --   --   AST 20 18  --  18 18  --   --   ALT 15 12  --  9 13  --   --   ALKPHOS 55 50  --  45 50  --   --   PROT 6.4* 5.5*  --  6.3* 6.7  --   --   ALBUMIN 3.0* 2.5*   < > 3.6 3.7 3.1* 3.0*   < > = values in this interval not displayed.    TUMOR MARKERS: No results for input(s): AFPTM, CEA, CA199, CHROMGRNA in the last 8760 hours.  Assessment and Plan:  ESRD with pending hemodialysis: [name] [name], 71 year old female, is tentatively scheduled 11/30/21 for an image-guided tunneled dialysis catheter placement.   Risks and benefits discussed with the patient including, but not limited to bleeding, infection, vascular injury, pneumothorax which may require chest tube placement, air embolism or even  death  All of the patient's questions were answered, patient is agreeable to proceed. She will be NPO at midnight.   Consent signed and in chart.  Thank you for this interesting consult.  I greatly enjoyed meeting [name] [name] and look forward to participating in their care.  A copy of this report was sent to the requesting provider on this date.  Electronically Signed: Soyla Dryer, AGACNP-BC [phone] 11/29/2021, 3:13 PM   I spent a total of 20 Minutes    in face to face in clinical consultation, greater than 50% of which was counseling/coordinating care for tunneled dialysis catheter placement.

## 2021-11-30 ENCOUNTER — Inpatient Hospital Stay (HOSPITAL_COMMUNITY): Payer: Medicare Other

## 2021-11-30 HISTORY — PX: IR US GUIDE VASC ACCESS RIGHT: IMG2390

## 2021-11-30 HISTORY — PX: IR FLUORO GUIDE CV LINE RIGHT: IMG2283

## 2021-11-30 LAB — RENAL FUNCTION PANEL
Albumin: 3.1 g/dL — ABNORMAL LOW (ref 3.5–5.0)
Anion gap: 12 (ref 5–15)
BUN: 164 mg/dL — ABNORMAL HIGH (ref 8–23)
CO2: 28 mmol/L (ref 22–32)
Calcium: 10.1 mg/dL (ref 8.9–10.3)
Chloride: 97 mmol/L — ABNORMAL LOW (ref 98–111)
Creatinine, Ser: 3.07 mg/dL — ABNORMAL HIGH (ref 0.44–1.00)
GFR, Estimated: 16 mL/min — ABNORMAL LOW (ref >60)
Glucose, Bld: 136 mg/dL — ABNORMAL HIGH (ref 70–99)
Phosphorus: 4.1 mg/dL (ref 2.5–4.6)
Potassium: 3.9 mmol/L (ref 3.5–5.1)
Sodium: 137 mmol/L (ref 135–145)

## 2021-11-30 LAB — CBC
HCT: 24.6 % — ABNORMAL LOW (ref 36.0–46.0)
Hemoglobin: 7.4 g/dL — ABNORMAL LOW (ref 12.0–15.0)
MCH: 25.9 pg — ABNORMAL LOW (ref 26.0–34.0)
MCHC: 30.1 g/dL (ref 30.0–36.0)
MCV: 86 fL (ref 80.0–100.0)
Platelets: 242 10*3/uL (ref 150–400)
RBC: 2.86 MIL/uL — ABNORMAL LOW (ref 3.87–5.11)
RDW: 20.5 % — ABNORMAL HIGH (ref 11.5–15.5)
WBC: 10.6 10*3/uL — ABNORMAL HIGH (ref 4.0–10.5)
nRBC: 0 % (ref 0.0–0.2)

## 2021-11-30 LAB — GLUCOSE, CAPILLARY
Glucose-Capillary: 132 mg/dL — ABNORMAL HIGH (ref 70–99)
Glucose-Capillary: 153 mg/dL — ABNORMAL HIGH (ref 70–99)
Glucose-Capillary: 203 mg/dL — ABNORMAL HIGH (ref 70–99)
Glucose-Capillary: 211 mg/dL — ABNORMAL HIGH (ref 70–99)

## 2021-11-30 LAB — HEPATITIS B SURFACE ANTIGEN: Hepatitis B Surface Ag: NONREACTIVE

## 2021-11-30 LAB — HEPATITIS B CORE ANTIBODY, TOTAL: Hep B Core Total Ab: NONREACTIVE

## 2021-11-30 MED ORDER — CEFAZOLIN SODIUM-DEXTROSE 2-4 GM/100ML-% IV SOLN
INTRAVENOUS | Status: AC
  Administered 2021-11-30: 2 g via INTRAVENOUS
  Filled 2021-11-30: qty 100

## 2021-11-30 MED ORDER — GELATIN ABSORBABLE 12-7 MM EX MISC
CUTANEOUS | Status: AC
  Filled 2021-11-30: qty 1

## 2021-11-30 MED ORDER — FENTANYL CITRATE (PF) 100 MCG/2ML IJ SOLN
INTRAMUSCULAR | Status: AC | PRN
Start: 2021-11-30 — End: 2021-11-30
  Administered 2021-11-30: 25 ug via INTRAVENOUS

## 2021-11-30 MED ORDER — CHLORHEXIDINE GLUCONATE CLOTH 2 % EX PADS
6.0000 | MEDICATED_PAD | Freq: Every day | CUTANEOUS | Status: DC
  Administered 2021-11-30 – 2021-12-08 (×8): 6 via TOPICAL

## 2021-11-30 MED ORDER — LIDOCAINE HCL 1 % IJ SOLN
INTRAMUSCULAR | Status: AC
  Administered 2021-11-30: 10 mL
  Filled 2021-11-30: qty 20

## 2021-11-30 MED ORDER — MIDAZOLAM HCL 2 MG/2ML IJ SOLN
INTRAMUSCULAR | Status: AC
  Filled 2021-11-30: qty 2

## 2021-11-30 MED ORDER — FENTANYL CITRATE (PF) 100 MCG/2ML IJ SOLN
INTRAMUSCULAR | Status: AC
  Filled 2021-11-30: qty 2

## 2021-11-30 MED ORDER — HEPARIN SODIUM (PORCINE) 1000 UNIT/ML IJ SOLN
INTRAMUSCULAR | Status: AC
  Filled 2021-11-30: qty 10

## 2021-11-30 MED ORDER — HEPARIN SODIUM (PORCINE) 1000 UNIT/ML IJ SOLN
INTRAMUSCULAR | Status: AC
  Filled 2021-11-30: qty 4

## 2021-11-30 NOTE — Progress Notes (Signed)
[facility]  Subjective  -seen in dialysis after [facility] placement today.   Objective 125/69 83 (!) 97.1 F (36.2 C) (Temporal) 16 99%  Intake/Output Summary (Last 24 hours) at 11/30/2021 1423 Last data filed at 11/30/2021 1154 Gross per 24 hour  Intake 220 ml  Output --  Net 220 ml    Right IJ tunneled dialysis catheter.  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  She got a right IJ tunneled catheter by IR today.  Seen in dialysis and discussed plan for left arm AV graft versus fistula tomorrow.  Please keep n.p.o. after midnight.  Consent form placed in chart.  Risks and benefits previously discussed.  Marty Heck 11/30/2021 2:23 PM --

## 2021-11-30 NOTE — Progress Notes (Signed)
Patient ID: [name] [name], female   DOB: [birth year], 71 y.o.   MRN: [id number] S: Very anxious to have the HD catheter placed.  Doesn't feel well and is nervous. O:BP 134/70 (BP Location: Right Arm)    Pulse 82    Temp 98.1 F (36.7 C) (Oral)    Resp 18    Ht 5\' 3"  (1.6 m)    Wt 93.7 kg    SpO2 100%    BMI 36.59 kg/m   Intake/Output Summary (Last 24 hours) at 11/30/2021 0847 Last data filed at 11/29/2021 2120 Gross per 24 hour  Intake 520 ml  Output 250 ml  Net 270 ml   Intake/Output: I/O last 3 completed shifts: In: 520 [P.O.:520] Out: 1100 [Urine:1100]  Intake/Output this shift:  No intake/output data recorded. Weight change: -3.5 kg Gen:NAD CVS:RRR Resp:CTA Abd: +BS, soft, NT/ND Ext: 1+ edema of RLE and RUE + tenderness to light touch  Recent Labs  Lab 11/24/21 0738 11/25/21 0110 11/26/21 0122 11/27/21 0148 11/28/21 0311 11/29/21 0313  NA 139 141 139 138 138 134*  K 3.7 4.0 4.1 3.8 3.8 4.3  CL 98 98 98 98 97* 95*  CO2 28 30 27 27 30 24   GLUCOSE 125* 154* 240* 191* 146* 244*  BUN 171* 169* 157* 146* 147* 163*  CREATININE 3.03* 2.98* 2.94* 2.75* 2.85* 3.49*  ALBUMIN 3.1* 3.6  --  3.7 3.1* 3.0*  CALCIUM 9.5 10.0 10.1 10.9* 10.7* 10.0  PHOS 5.0* 4.3  --   --  4.8* 3.9  AST  --  18  --  18  --   --   ALT  --  9  --  13  --   --    Liver Function Tests: Recent Labs  Lab 11/25/21 0110 11/27/21 0148 11/28/21 0311 11/29/21 0313  AST 18 18  --   --   ALT 9 13  --   --   ALKPHOS 45 50  --   --   BILITOT 0.2* 0.8  --   --   PROT 6.3* 6.7  --   --   ALBUMIN 3.6 3.7 3.1* 3.0*   No results for input(s): LIPASE, AMYLASE in the last 168 hours. No results for input(s): AMMONIA in the last 168 hours. CBC: Recent Labs  Lab 11/26/21 1147 11/27/21 0148 11/28/21 0311 11/29/21 0313 11/30/21 0745  WBC 21.0* 19.8* 12.5* 12.9* 10.6*  NEUTROABS 19.4*  --   --   --   --   HGB 8.8* 8.9* 8.3* 7.4* 7.4*  HCT 28.8* 28.9* 27.5* 23.7* 24.6*  MCV 87.5 87.3 89.6 86.5 86.0   PLT 282 263 228 214 242   Cardiac Enzymes: No results for input(s): CKTOTAL, CKMB, CKMBINDEX, TROPONINI in the last 168 hours. CBG: Recent Labs  Lab 11/28/21 2139 11/29/21 0802 11/29/21 1132 11/29/21 1701 11/29/21 2029  GLUCAP 287* 195* 162* 343* 307*    Iron Studies: No results for input(s): IRON, TIBC, TRANSFERRIN, FERRITIN in the last 72 hours. Studies/Results: CT CHEST WO CONTRAST  Result Date: 11/28/2021 CLINICAL DATA:  Left lower lobe lung nodules. EXAM: CT CHEST WITHOUT CONTRAST TECHNIQUE: Multidetector CT imaging of the chest was performed following the standard protocol without IV contrast. RADIATION DOSE REDUCTION: This exam was performed according to the departmental dose-optimization program which includes automated exposure control, adjustment of the mA and/or kV according to patient size and/or use of iterative reconstruction technique. COMPARISON:  CT chest 08/10/2019. CT abdomen and pelvis 02/15/2021. MRI abdomen 11/24/2021. FINDINGS:  Cardiovascular: Thoracic aortic atherosclerosis without aneurysm. LAD and left circumflex coronary atherosclerosis. Cardiomegaly. No pericardial effusion. Mediastinum/Nodes: No enlarged axillary, mediastinal, or hilar lymph nodes are identified, however hilar assessment is limited in the absence of IV contrast. Unremarkable esophagus. Lungs/Pleura: Trace right pleural effusion. Mild dependent atelectasis in the lower lobes. As seen on the recent abdominal CT, there is a 2.8 x 1.8 cm nodule in the left lower lobe which has greatly enlarged from 2020 when it measured 1.2 cm (series 8, image 83). There is associated mild spiculation, and band like densities extend from the nodule posterolaterally to the pleura and anteriorly to the major fissure which is mildly retracted. A 5 mm nodule in the right middle lobe is new (series 8, image 89). 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 Upper Abdomen: Chronic bilateral adrenal  enlargement. Partially visualized left renal lesions, more fully characterized on the recent abdominal MRI. Musculoskeletal: Old bilateral rib fractures. Small sclerotic focus in the T3 vertebral body, unchanged from 2020 and possibly a bone island. Moderate thoracic spondylosis. IMPRESSION: 1. 2.8 x 1.8 cm left lower lobe nodule which has greatly enlarged from 2020 and is most concerning for primary lung malignancy. 2. New 5 mm right middle lobe nodule, indeterminate though potentially post-infectious/inflammatory. Attention on follow-up to exclude a metastasis. 3. Trace right pleural effusion. 4. Aortic Atherosclerosis (ICD10-I70.0). Electronically Signed   By: Logan Bores M.D.   On: 11/28/2021 14:52    amoxicillin-clavulanate  1 tablet Oral BID   carvedilol  12.5 mg Oral BID   Chlorhexidine Gluconate Cloth  6 each Topical Q0600   fluticasone furoate-vilanterol  1 puff Inhalation Daily   heparin  5,000 Units Subcutaneous Q8H   hydrALAZINE  50 mg Oral Q8H   insulin aspart  0-9 Units Subcutaneous TID WC   insulin aspart  5 Units Subcutaneous TID WC   insulin glargine-yfgn  15 Units Subcutaneous Daily   levothyroxine  200 mcg Oral Q0600   metoCLOPramide  5 mg Oral TID   pantoprazole  40 mg Oral QHS   polyethylene glycol  17 g Oral BID   predniSONE  20 mg Oral Q breakfast   senna  2 tablet Oral QHS   sevelamer carbonate  800 mg Oral TID WC   traZODone  50 mg Oral QHS    BMET    Component Value Date/Time   NA 134 (L) 11/29/2021 0313   NA 146 (H) 12/23/2013 1132   K 4.3 11/29/2021 0313   CL 95 (L) 11/29/2021 0313   CO2 24 11/29/2021 0313   GLUCOSE 244 (H) 11/29/2021 0313   BUN 163 (H) 11/29/2021 0313   BUN 31 (H) 12/23/2013 1132   CREATININE 3.49 (H) 11/29/2021 0313   CREATININE 3.51 (HH) 10/17/2020 1234   CREATININE 1.32 (H) 06/29/2014 1100   CALCIUM 10.0 11/29/2021 0313   CALCIUM 9.1 09/07/2021 1036   GFRNONAA 13 (L) 11/29/2021 0313   GFRNONAA 13 (L) 10/17/2020 1234   GFRAA 15  (L) 04/22/2020 1129   CBC    Component Value Date/Time   WBC 10.6 (H) 11/30/2021 0745   RBC 2.86 (L) 11/30/2021 0745   HGB 7.4 (L) 11/30/2021 0745   HCT 24.6 (L) 11/30/2021 0745   HCT 24.6 (L) 10/17/2020 1235   PLT 242 11/30/2021 0745   MCV 86.0 11/30/2021 0745   MCH 25.9 (L) 11/30/2021 0745   MCHC 30.1 11/30/2021 0745   RDW 20.5 (H) 11/30/2021 0745   RDW 15.7 (H) 12/23/2013  1132   LYMPHSABS 0.5 (L) 11/26/2021 1147   LYMPHSABS 2.5 12/23/2013 1132   MONOABS 0.9 11/26/2021 1147   EOSABS 0.1 11/26/2021 1147   EOSABS 0.1 12/23/2013 1132   BASOSABS 0.0 11/26/2021 1147   BASOSABS 0.0 12/23/2013 1132    Assessment/Plan:   Non-Oliguric AKI/azotemia: Creatinine is back to baseline and BUN improving daily.  However, BUN fairly high.  Unclear what caused BUN elevation to be so high out of proportion to creatinine.  She has not demonstrated signs of uremia thus far although had been confused over the weekend. I suspect this is delirium and not uremia given the BUN has continued to trend down  BUN/Cr significantly increased over the past 48 hours.  Discussed worsening renal function and she has agreed to proceed with HD catheter placement and initiation of dialysis today Continue to monitor daily Cr, Dose meds for GFR which is now <15 ml/min Monitor Daily I/Os, Daily weight  Maintain MAP>65 for optimal renal perfusion. Avoid nephrotoxic medications including NSAIDs, IV Contrast and phosphate containing bowel preps (FLEETS)  Use synthetic opioids (Fentanyl/Dilaudid) if needed Dr. Carlis Abbott to proceed with AVG placement tomorrow. Congestive heart failure: EF around 40 to 85% with diastolic dysfunction.  Lower extremity edema persistent but also may be multifactorial.  Will manage with HD  Left lower Lung nodule - concerning for malignancy and PET scan was recommended.  CT scan of chest showed a new right middle lobe nodule as well concerning for mets.  Dr. Valeta Harms consulted and will arrange for PET  scan before attempt at bronchoscopy and biopsy as an outpatient. COPD - longtime smoker.  Continue with nebs per primary.  Likely etiology of SOB.   Hyperphosphatemia: Sevelamer  Hypercalemia - not on vitamin D or calcium supplements, likely due to immobility.  Ambulate as tolerated but may also be due to malignancy as above.  Anemia of CKD: Iron deficiency.  IV iron ordered.  Monitor hemoglobin trend.  Hold off on ESA for now given possible lung cancer. 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  Follow-up 3 to 6 months. HTN: Coreg 12.5 mg twice daily.  Blood pressure goal Right lower extremity edema: Predominantly on the right side.  Had PVL of the right leg on 2/15 that was negative for DVT.  Improved.  Donetta Potts, MD [employer] [phone]

## 2021-11-30 NOTE — Progress Notes (Signed)
Contacted regarding pt's need for out-pt HD at d/c. Met with pt at bedside this am and pt's husband on speaker phone. Pt plans to stay with sister, [name], at d/c ([address], [place]) for a few weeks. Pt plans to return to [place] home once some repairs are completed. Discussed out-pt HD options. Pt prefers to proceed with referral to [facility] in [facility] then pt can be transferred to a [place] clinic once pt returns home. Pt states that pt's sister should be able to provide transportation to HD appts if pt does not qualify for any transportation services. Will proceed with making referral to [facility] admissions today. Contacted RN CM regarding pt's sister's address at d/c and that pt is requesting transportation resources to/from HD. Will assist as needed.   Melven Sartorius Renal Navigator [phone]

## 2021-11-30 NOTE — Procedures (Signed)
Interventional Radiology Procedure Note  Procedure: Placement of a right IJ approach tunneled HD catheter 19cm tip to cuff.  Tip is positioned at the superior cavoatrial junction and catheter is ready for immediate use.  Complications: None Recommendations:  - Ok to use - Do not submerge - Routine line care   Signed,  Benedetta Sundstrom S. Kerri Kovacik, DO    

## 2021-11-30 NOTE — Progress Notes (Signed)
Mobility Specialist Progress Note:   11/30/21 1339  Mobility  Activity Off unit   Will follow-up as time allows.  [facility] Public librarian Phone [phone]

## 2021-11-30 NOTE — H&P (View-Only) (Signed)
[facility]  Subjective  -seen in dialysis after [facility] placement today.   Objective 125/69 83 (!) 97.1 F (36.2 C) (Temporal) 16 99%  Intake/Output Summary (Last 24 hours) at 11/30/2021 1423 Last data filed at 11/30/2021 1154 Gross per 24 hour  Intake 220 ml  Output --  Net 220 ml    Right IJ tunneled dialysis catheter.  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  She got a right IJ tunneled catheter by IR today.  Seen in dialysis and discussed plan for left arm AV graft versus fistula tomorrow.  Please keep n.p.o. after midnight.  Consent form placed in chart.  Risks and benefits previously discussed.  Marty Heck 11/30/2021 2:23 PM --

## 2021-11-30 NOTE — TOC Progression Note (Signed)
Transition of Care [facility]) - Progression Note    Patient Details  Name: MIRELA PARSLEY MRN: [id number] Date of Birth: Aug 22, 1951  [facility] CM/SW Contact  Jacalyn Lefevre Edson Snowball, RN Phone Number: 11/30/2021, 10:06 AM  Clinical Narrative:     Spoke to patient at bedside. Patient is now planning o staying with her sister [name] at discharge address: [address], [place], [address].   Anderson Malta with [facility] updated. PAtient will update the agency she receives aides through.   NCM provided transportation resources to patient including medicaid transportation.   Expected Discharge Plan: [place] Barriers to Discharge: Continued Medical Work up  Expected Discharge Plan and Services Expected Discharge Plan: [place]   Discharge Planning Services: CM Consult Post Acute Care Choice: [place] arrangements for the past 2 months: Apartment                   DME Agency: NA       HH Arranged: RN           Social Determinants of Health (SDOH) Interventions    Readmission Risk Interventions Readmission Risk Prevention Plan 01/14/2020  Transportation Screening Complete  Medication Review [employer]) Complete  PCP or Specialist appointment within 3-5 days of discharge Complete  HRI or Home Care Consult Complete  SW Recovery Care/Counseling Consult Complete  Palliative Care Screening Not [place] Complete  Some recent data might be hidden

## 2021-11-30 NOTE — Progress Notes (Addendum)
HD#7 SUBJECTIVE:  Patient Summary: [name] [name] is a 71 y.o. with a pertinent PMH of DM2 and CKD stage 5, who presented with LOC and admitted for syncope.   Overnight Events: THDC placed by IR    Interm History: Patient seen and evaluated. She is lying comfortably in HD bed. Reports she is hungry. Pain in legs has improved, still tender.   OBJECTIVE:  Vital Signs: Vitals:   11/30/21 1250 11/30/21 1330 11/30/21 1400 11/30/21 1430  BP: 123/68 (!) 88/64 125/69 (!) 118/55  Pulse: 89 95 83 61  Resp: 16     Temp:      TempSrc:      SpO2:      Weight:      Height:       Constitutional: Well-developed, well-nourished,  alert Neck: Normal range of motion.  Cardiovascular: Normal rate, regular rhythm, intact distal pulses. No gallop and no friction rub. No murmur heard. rope-like mass in right antecubital fossa. HD cath in place and working.  Pulmonary: Breathing appears improved today. Comfortable with normal work of breathing while not wearing [place].  Abdominal: Soft.  +bowel sounds. Non distended. No tenderness, nonperitonitic Musculoskeletal: Normal range of motion. Tender lower extremities, right more edematous than left, nonpitting Neurological: Awake and alert. Responds appropriately.   Skin: wound left shin. Edematous, indurated, and erythema of left antecubital fossa. Rope-like tender mass palpable in right AC fossa.    Pertinent Labs: CBC Latest Ref Rng & Units 11/30/2021 11/29/2021 11/28/2021  WBC 4.0 - 10.5 K/uL 10.6(H) 12.9(H) 12.5(H)  Hemoglobin 12.0 - 15.0 g/dL 7.4(L) 7.4(L) 8.3(L)  Hematocrit 36.0 - 46.0 % 24.6(L) 23.7(L) 27.5(L)  Platelets 150 - 400 K/uL 242 214 228    CMP Latest Ref Rng & Units 11/30/2021 11/29/2021 11/28/2021  Glucose 70 - 99 mg/dL 136(H) 244(H) 146(H)  BUN 8 - 23 mg/dL 164(H) 163(H) 147(H)  Creatinine 0.44 - 1.00 mg/dL 3.07(H) 3.49(H) 2.85(H)  Sodium 135 - 145 mmol/L 137 134(L) 138  Potassium 3.5 - 5.1 mmol/L 3.9 4.3 3.8  Chloride 98 - 111  mmol/L 97(L) 95(L) 97(L)  CO2 22 - 32 mmol/L 28 24 30   Calcium 8.9 - 10.3 mg/dL 10.1 10.0 10.7(H)  Total Protein 6.5 - 8.1 g/dL - - -  Total Bilirubin 0.3 - 1.2 mg/dL - - -  Alkaline Phos 38 - 126 U/L - - -  AST 15 - 41 U/L - - -  ALT 0 - 44 U/L - - -     ASSESSMENT/PLAN:   Assessment:  #CKD stage 5 approaching ESRD with evidence of uremia and volume overload. - after lengthy [place] discussions, patient is agreeable to dialysis. Nephro and VVS are following patient. BUN and Cr have risen again since yesterday; She has had HD catheter placed by IR and initiated dialysis today. We will continue to follow her renal function. - Avoid nephrotoxic drugs and dose medications per GFR. She has neuropathic pain in her legs and can be given small doses 100mg  Gabapentin as needed for a maximum of 300mg  daily. For more severe pain, dilaudid can be given.  - Continue Sevelamer for her hyperphosphatemia  - Strict I&Os, daily weights, telemetry  - Vascular surgery planning for AVG tomorrow for more permanent access. NPO at midnight.  #Acute Hypoxic Respiratory Failure 2/2 pulmonary edema and Possible COPD exacerbation  # lung nodules Patient had been reporting shortness of breath and was found to have pulmonary edema on CXR. She improved with IV lasix.  -  She  uses Symbicort at home and was started on Breo, duonebs, and prednisone for suspected COPD exacerbation. She has completed her steroid course. Breathing status has improved since initiation of breathing treatments as well. She is breathing comfortably and satting 100% on RA.  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 Initially noted on CT scan 2020. Due to its enlarging nature, nodule was felt to be malignant and further further evaluated with dedicated chest CT which demonstrated the  LLL nodule had greatly enlarged from 2020 concerning for primary lung malignancy.  Additionally, a new right middle lobe nodule seen on imaging, possible post-infectious/inflammatory was also observed and will require follow up to exclude metastasis.  - PCCM has been consulted. No plans for Bx this admission. She has been scheduled for outpatient PET scan and follow up in their office.    #Acute on Chronic HFmrEF Patient's echo 02/2021.  EF noted to be 45 to 50% at that time.  There was no wall motion abnormalities.  She was noted to have left ventricular hypertrophy, no valvulopathy, left atrium was noted to be severely dilated. ECHO shows mild worsening of EF to 40-45% in setting of worsening uremia.  Patient is on torsemide milligrams twice daily per EMR as well as metolazone 3 times a week. Her Home diuretics are being held for now. Plan to d/c now given plans for ESRD.  - Strict I&Os - Daily weights   IV infiltration vs superficial thrombosis vs cellulitis of left AC fossa - vascular US obtained showing superficial thrombus as well as DVT in ulnar vein. Superficial thrombus able to be managed with warm compresses. More distal DVT has low probability of embolizing and can be observed at this time.  Cannot exclude possibility of cellulitis given leukocytosis, though she has been afebrile which is more suggestive of reactive leukocytosis. Will continue on abx for superficial cellulitis at this time. Bcx collected and these will need to be negative effectively ruling out thrombophlebitis prior to proceeding with vascular access for HD. Fortunately, blood cultures have been negative thus far.  - she has completed a course of abx.  #Chronic Pain, Chronic use of opioids - has not been receiving her home oxycodone here. On review of PDMP, she receives #120 tablets of percocet 10-325 a month and fills regularly. With her progressive CKD, we have added oral dilaudid PRN rather than resuming her home oxy.   - Oral dilaudid PRN    #Constipation Generalized abdominal pain. Imaging revealed large stool burden. - miralax and senna daily    #T2DM Patient's last A1c was 02/2021 and noted to be 6.8.  Per EMR she takes 75/25 insulin 40 units in the a.m. and 30 units in the evening as well as Trulicity 1.5 mg weekly.  Patient is also on Reglan with 3 times daily dosing. -We will start patient on sliding scale insulin and continue her home Reglan.  Will uptitrate as necessary.  #Renal mass  Patient noted to have interval enlargement of L kidney exophytic density (now 3.2 cm) compared to 02/2021 imaging - patient underwent MRI imaging of this mass. 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 Patient was noted to be significantly iron deficient on labs 09/2021 with a ferritin of 11 and a transferrin saturation of 7%.  She did receive IV iron in the past for this most recently, Feraheme 10/04/2021. - she has been started on IV iron. Will plan to follow daily CBC  Signature: Delene Ruffini, MD  Internal Medicine Resident, PGY-1 [facility]  Pager: [phone] 2:46 PM, 11/30/2021   Please contact the on call pager after 5 pm and on weekends at [phone].

## 2021-11-30 NOTE — Progress Notes (Signed)
This chaplain is present for F/U spiritual care during the Pt. first HD.  The Pt. is awake and questioning when she can eat and how much longer.   The chaplain assisted the Pt. with finding the answers to her questions. With the PA assistance the chaplain helped the Pt. eat her applesauce and learn how to read the monitor.    This chaplain is available for F/U spiritual care as needed.  Chaplain [name] [phone]

## 2021-11-30 NOTE — Progress Notes (Signed)
PT Cancellation Note  Patient Details Name: [name] [name] MRN: [id number] DOB: 02-Dec-1950   Cancelled Treatment:    Reason Eval/Treat Not Completed: (P) Patient at procedure or test/unavailable (pt at HD dept.) Will continue efforts per PT plan of care as schedule permits.   Shemekia Patane M Jacari Iannello 11/30/2021, 2:05 PM

## 2021-12-01 ENCOUNTER — Encounter (HOSPITAL_COMMUNITY)
Admission: EM | Disposition: A | Discharge status: Home Health | Payer: Self-pay | Source: Home / Self Care | Attending: Internal Medicine

## 2021-12-01 ENCOUNTER — Encounter (HOSPITAL_COMMUNITY): Payer: Self-pay | Admitting: Student

## 2021-12-01 ENCOUNTER — Inpatient Hospital Stay (HOSPITAL_COMMUNITY): Payer: Medicare Other | Admitting: Anesthesiology

## 2021-12-01 DIAGNOSIS — E1122 Type 2 diabetes mellitus with diabetic chronic kidney disease: Secondary | ICD-10-CM | POA: Diagnosis not present

## 2021-12-01 DIAGNOSIS — I509 Heart failure, unspecified: Secondary | ICD-10-CM

## 2021-12-01 DIAGNOSIS — N185 Chronic kidney disease, stage 5: Secondary | ICD-10-CM | POA: Diagnosis not present

## 2021-12-01 DIAGNOSIS — N186 End stage renal disease: Secondary | ICD-10-CM

## 2021-12-01 DIAGNOSIS — I132 Hypertensive heart and chronic kidney disease with heart failure and with stage 5 chronic kidney disease, or end stage renal disease: Secondary | ICD-10-CM

## 2021-12-01 DIAGNOSIS — E440 Moderate protein-calorie malnutrition: Secondary | ICD-10-CM | POA: Insufficient documentation

## 2021-12-01 HISTORY — PX: AV FISTULA PLACEMENT: SHX1204

## 2021-12-01 LAB — HEPATITIS B SURFACE ANTIBODY, QUANTITATIVE: Hep B S AB Quant (Post): 3.2 m[IU]/mL — ABNORMAL LOW (ref >9.9)

## 2021-12-01 LAB — GLUCOSE, CAPILLARY
Glucose-Capillary: 134 mg/dL — ABNORMAL HIGH (ref 70–99)
Glucose-Capillary: 122 mg/dL — ABNORMAL HIGH (ref 70–99)
Glucose-Capillary: 155 mg/dL — ABNORMAL HIGH (ref 70–99)
Glucose-Capillary: 173 mg/dL — ABNORMAL HIGH (ref 70–99)

## 2021-12-01 LAB — RENAL FUNCTION PANEL
Albumin: 3 g/dL — ABNORMAL LOW (ref 3.5–5.0)
Anion gap: 12 (ref 5–15)
BUN: 99 mg/dL — ABNORMAL HIGH (ref 8–23)
CO2: 26 mmol/L (ref 22–32)
Calcium: 9.6 mg/dL (ref 8.9–10.3)
Chloride: 97 mmol/L — ABNORMAL LOW (ref 98–111)
Creatinine, Ser: 2.29 mg/dL — ABNORMAL HIGH (ref 0.44–1.00)
GFR, Estimated: 22 mL/min — ABNORMAL LOW (ref >60)
Glucose, Bld: 131 mg/dL — ABNORMAL HIGH (ref 70–99)
Phosphorus: 3.9 mg/dL (ref 2.5–4.6)
Potassium: 3.7 mmol/L (ref 3.5–5.1)
Sodium: 135 mmol/L (ref 135–145)

## 2021-12-01 LAB — CBC
HCT: 27.8 % — ABNORMAL LOW (ref 36.0–46.0)
Hemoglobin: 8.8 g/dL — ABNORMAL LOW (ref 12.0–15.0)
MCH: 27 pg (ref 26.0–34.0)
MCHC: 31.7 g/dL (ref 30.0–36.0)
MCV: 85.3 fL (ref 80.0–100.0)
Platelets: 148 10*3/uL — ABNORMAL LOW (ref 150–400)
RBC: 3.26 MIL/uL — ABNORMAL LOW (ref 3.87–5.11)
RDW: 19.1 % — ABNORMAL HIGH (ref 11.5–15.5)
WBC: 13.4 10*3/uL — ABNORMAL HIGH (ref 4.0–10.5)
nRBC: 0 % (ref 0.0–0.2)

## 2021-12-01 LAB — RESP PANEL BY RT-PCR (FLU A&B, COVID) ARPGX2
Influenza A by PCR: NEGATIVE
Influenza B by PCR: NEGATIVE
SARS Coronavirus 2 by RT PCR: NEGATIVE

## 2021-12-01 LAB — PREPARE RBC (CROSSMATCH)

## 2021-12-01 SURGERY — INSERTION OF ARTERIOVENOUS (AV) GORE-TEX GRAFT ARM
Anesthesia: General | Laterality: Left

## 2021-12-01 MED ORDER — ACETAMINOPHEN 500 MG PO TABS
ORAL_TABLET | ORAL | Status: AC
  Administered 2021-12-01: 1000 mg via ORAL
  Filled 2021-12-01: qty 2

## 2021-12-01 MED ORDER — LIDOCAINE-EPINEPHRINE (PF) 1 %-1:200000 IJ SOLN
INTRAMUSCULAR | Status: AC
  Filled 2021-12-01: qty 30

## 2021-12-01 MED ORDER — ACETAMINOPHEN 500 MG PO TABS: 1000.0000 mg | ORAL_TABLET | Freq: Once | ORAL | Status: AC

## 2021-12-01 MED ORDER — ONDANSETRON HCL 4 MG/2ML IJ SOLN
INTRAMUSCULAR | Status: DC | PRN
  Administered 2021-12-01: 4 mg via INTRAVENOUS

## 2021-12-01 MED ORDER — PROPOFOL 10 MG/ML IV BOLUS
INTRAVENOUS | Status: AC
  Filled 2021-12-01: qty 20

## 2021-12-01 MED ORDER — BUPIVACAINE HCL (PF) 0.5 % IJ SOLN
INTRAMUSCULAR | Status: DC | PRN
  Administered 2021-12-01: 30 mL

## 2021-12-01 MED ORDER — EPHEDRINE 5 MG/ML INJ
INTRAVENOUS | Status: AC
  Filled 2021-12-01: qty 5

## 2021-12-01 MED ORDER — DEXAMETHASONE SODIUM PHOSPHATE 10 MG/ML IJ SOLN
INTRAMUSCULAR | Status: AC
  Filled 2021-12-01: qty 1

## 2021-12-01 MED ORDER — ONDANSETRON HCL 4 MG/2ML IJ SOLN
INTRAMUSCULAR | Status: AC
  Filled 2021-12-01: qty 2

## 2021-12-01 MED ORDER — VASOPRESSIN 20 UNIT/ML IV SOLN
INTRAVENOUS | Status: DC | PRN
  Administered 2021-12-01 (×3): 2 [IU] via INTRAVENOUS

## 2021-12-01 MED ORDER — SODIUM CHLORIDE 0.9% IV SOLUTION: Freq: Once | INTRAVENOUS | Status: DC

## 2021-12-01 MED ORDER — ALBUTEROL SULFATE HFA 108 (90 BASE) MCG/ACT IN AERS
INHALATION_SPRAY | RESPIRATORY_TRACT | Status: AC
  Filled 2021-12-01: qty 6.7

## 2021-12-01 MED ORDER — HEPARIN 6000 UNIT IRRIGATION SOLUTION
Status: DC | PRN
  Administered 2021-12-01 (×2): 1

## 2021-12-01 MED ORDER — 0.9 % SODIUM CHLORIDE (POUR BTL) OPTIME
TOPICAL | Status: DC | PRN
  Administered 2021-12-01: 1000 mL

## 2021-12-01 MED ORDER — CHLORHEXIDINE GLUCONATE 0.12 % MT SOLN
15.0000 mL | Freq: Once | OROMUCOSAL | Status: AC
  Administered 2021-12-01: 15 mL via OROMUCOSAL
  Filled 2021-12-01: qty 15

## 2021-12-01 MED ORDER — GABAPENTIN 100 MG PO CAPS
100.0000 mg | ORAL_CAPSULE | Freq: Three times a day (TID) | ORAL | Status: DC
Start: 2021-12-01 — End: 2021-12-07
  Administered 2021-12-01 – 2021-12-06 (×15): 100 mg via ORAL
  Filled 2021-12-01 (×16): qty 1

## 2021-12-01 MED ORDER — FENTANYL CITRATE (PF) 250 MCG/5ML IJ SOLN
INTRAMUSCULAR | Status: DC | PRN
  Administered 2021-12-01 (×2): 50 ug via INTRAVENOUS

## 2021-12-01 MED ORDER — FENTANYL CITRATE (PF) 250 MCG/5ML IJ SOLN
INTRAMUSCULAR | Status: AC
  Filled 2021-12-01: qty 5

## 2021-12-01 MED ORDER — FENTANYL CITRATE (PF) 100 MCG/2ML IJ SOLN: 25.0000 ug | INTRAMUSCULAR | Status: DC | PRN

## 2021-12-01 MED ORDER — BUPIVACAINE HCL (PF) 0.5 % IJ SOLN
INTRAMUSCULAR | Status: AC
  Filled 2021-12-01: qty 30

## 2021-12-01 MED ORDER — EPHEDRINE SULFATE-NACL 50-0.9 MG/10ML-% IV SOSY
PREFILLED_SYRINGE | INTRAVENOUS | Status: DC | PRN
  Administered 2021-12-01: 15 mg via INTRAVENOUS

## 2021-12-01 MED ORDER — VASOPRESSIN 20 UNIT/ML IV SOLN
INTRAVENOUS | Status: AC
  Filled 2021-12-01: qty 1

## 2021-12-01 MED ORDER — DEXAMETHASONE SODIUM PHOSPHATE 10 MG/ML IJ SOLN
INTRAMUSCULAR | Status: DC | PRN
  Administered 2021-12-01: 8 mg via INTRAVENOUS

## 2021-12-01 MED ORDER — CEFAZOLIN SODIUM-DEXTROSE 2-3 GM-%(50ML) IV SOLR
INTRAVENOUS | Status: DC | PRN
  Administered 2021-12-01: 2 g via INTRAVENOUS

## 2021-12-01 MED ORDER — HEPARIN SODIUM (PORCINE) 1000 UNIT/ML IJ SOLN
INTRAMUSCULAR | Status: AC
  Filled 2021-12-01: qty 1

## 2021-12-01 MED ORDER — HEMOSTATIC AGENTS (NO CHARGE) OPTIME
TOPICAL | Status: DC | PRN
  Administered 2021-12-01: 1 via TOPICAL

## 2021-12-01 MED ORDER — BUPIVACAINE LIPOSOME 1.3 % IJ SUSP
INTRAMUSCULAR | Status: AC
  Filled 2021-12-01: qty 20

## 2021-12-01 MED ORDER — SODIUM CHLORIDE (PF) 0.9 % IJ SOLN
INTRAMUSCULAR | Status: DC | PRN
  Administered 2021-12-01: 40 mL via INTRAVENOUS

## 2021-12-01 MED ORDER — BUPIVACAINE LIPOSOME 1.3 % IJ SUSP
INTRAMUSCULAR | Status: DC | PRN
  Administered 2021-12-01: 20 mL

## 2021-12-01 MED ORDER — SODIUM CHLORIDE 0.9 % IV SOLN: INTRAVENOUS | Status: DC

## 2021-12-01 MED ORDER — PHENYLEPHRINE HCL-NACL 20-0.9 MG/250ML-% IV SOLN
INTRAVENOUS | Status: DC | PRN
  Administered 2021-12-01: 55 ug/min via INTRAVENOUS
  Administered 2021-12-01: 65 ug/min via INTRAVENOUS

## 2021-12-01 MED ORDER — ALBUTEROL SULFATE HFA 108 (90 BASE) MCG/ACT IN AERS
INHALATION_SPRAY | RESPIRATORY_TRACT | Status: DC | PRN
  Administered 2021-12-01: 2 via RESPIRATORY_TRACT

## 2021-12-01 MED ORDER — SODIUM CHLORIDE 0.9 % IV SOLN: INTRAVENOUS | Status: DC | PRN

## 2021-12-01 MED ORDER — ORAL CARE MOUTH RINSE: 15.0000 mL | Freq: Once | OROMUCOSAL | Status: AC

## 2021-12-01 MED ORDER — LIDOCAINE 2% (20 MG/ML) 5 ML SYRINGE
INTRAMUSCULAR | Status: DC | PRN
Start: 2021-12-01 — End: 2021-12-01
  Administered 2021-12-01: 60 mg via INTRAVENOUS

## 2021-12-01 MED ORDER — LIDOCAINE 2% (20 MG/ML) 5 ML SYRINGE
INTRAMUSCULAR | Status: AC
  Filled 2021-12-01: qty 5

## 2021-12-01 MED ORDER — ENSURE ENLIVE PO LIQD
237.0000 mL | Freq: Three times a day (TID) | ORAL | Status: DC
  Administered 2021-12-02 – 2021-12-07 (×15): 237 mL via ORAL

## 2021-12-01 MED ORDER — HEPARIN 6000 UNIT IRRIGATION SOLUTION
Status: AC
  Filled 2021-12-01: qty 500

## 2021-12-01 MED ORDER — RENA-VITE PO TABS
1.0000 | ORAL_TABLET | Freq: Every day | ORAL | Status: DC
  Administered 2021-12-02 – 2021-12-07 (×6): 1 via ORAL
  Filled 2021-12-01 (×7): qty 1

## 2021-12-01 MED ORDER — PROPOFOL 10 MG/ML IV BOLUS
INTRAVENOUS | Status: DC | PRN
  Administered 2021-12-01: 50 mg via INTRAVENOUS
  Administered 2021-12-01: 150 mg via INTRAVENOUS

## 2021-12-01 SURGICAL SUPPLY — 45 items
ARMBAND PINK RESTRICT EXTREMIT (MISCELLANEOUS) ×3 IMPLANT
BAG COUNTER SPONGE SURGICOUNT (BAG) ×2 IMPLANT
BLADE CLIPPER SURG (BLADE) ×2 IMPLANT
CANISTER SUCT 3000ML PPV (MISCELLANEOUS) ×2 IMPLANT
CLIP VESOCCLUDE MED 6/CT (CLIP) ×2 IMPLANT
CLIP VESOCCLUDE SM WIDE 6/CT (CLIP) ×2 IMPLANT
CNTNR URN SCR LID CUP LEK RST (MISCELLANEOUS) IMPLANT
CONT SPEC 4OZ STRL OR WHT (MISCELLANEOUS) ×1
COVER PROBE W GEL 5X96 (DRAPES) ×2 IMPLANT
DECANTER SPIKE VIAL GLASS SM (MISCELLANEOUS) ×1 IMPLANT
DERMABOND ADVANCED (GAUZE/BANDAGES/DRESSINGS) ×1
DERMABOND ADVANCED .7 DNX12 (GAUZE/BANDAGES/DRESSINGS) ×1 IMPLANT
ELECT REM PT RETURN 9FT ADLT (ELECTROSURGICAL) ×2
ELECTRODE REM PT RTRN 9FT ADLT (ELECTROSURGICAL) ×1 IMPLANT
GLOVE SRG 8 PF TXTR STRL LF DI (GLOVE) ×1 IMPLANT
GLOVE SURG ENC MOIS LTX SZ7.5 (GLOVE) ×2 IMPLANT
GLOVE SURG UNDER POLY LF SZ8 (GLOVE) ×1
GOWN STRL REUS W/ TWL LRG LVL3 (GOWN DISPOSABLE) ×2 IMPLANT
GOWN STRL REUS W/ TWL XL LVL3 (GOWN DISPOSABLE) ×2 IMPLANT
GOWN STRL REUS W/TWL LRG LVL3 (GOWN DISPOSABLE) ×2
GOWN STRL REUS W/TWL XL LVL3 (GOWN DISPOSABLE) ×2
GRAFT GORETEX STRT 4-7X45 (Vascular Products) ×1 IMPLANT
HEMOSTAT SNOW SURGICEL 2X4 (HEMOSTASIS) ×1 IMPLANT
HEMOSTAT SPONGE AVITENE ULTRA (HEMOSTASIS) IMPLANT
KIT BASIN OR (CUSTOM PROCEDURE TRAY) ×2 IMPLANT
KIT TURNOVER KIT B (KITS) ×2 IMPLANT
NDL BEVEL SYR 25X1 (NEEDLE) IMPLANT
NDL HYPO 25GX1X1/2 BEV (NEEDLE) IMPLANT
NEEDLE BEVEL SYR 25X1 (NEEDLE) IMPLANT
NEEDLE HYPO 25GX1X1/2 BEV (NEEDLE) ×2 IMPLANT
NS IRRIG 1000ML POUR BTL (IV SOLUTION) ×2 IMPLANT
PACK CV ACCESS (CUSTOM PROCEDURE TRAY) ×2 IMPLANT
PAD ARMBOARD 7.5X6 YLW CONV (MISCELLANEOUS) ×4 IMPLANT
SURGIFLO W/THROMBIN 8M KIT (HEMOSTASIS) ×1 IMPLANT
SUT GORETEX 6.0 TT13 (SUTURE) IMPLANT
SUT MNCRL AB 4-0 PS2 18 (SUTURE) ×2 IMPLANT
SUT PROLENE 6 0 BV (SUTURE) ×2 IMPLANT
SUT PROLENE 7 0 BV 1 (SUTURE) IMPLANT
SUT SILK 2 0 PERMA HAND 18 BK (SUTURE) IMPLANT
SUT VIC AB 3-0 SH 27 (SUTURE) ×2
SUT VIC AB 3-0 SH 27X BRD (SUTURE) ×2 IMPLANT
SYR CONTROL 10ML LL (SYRINGE) ×1 IMPLANT
TOWEL GREEN STERILE (TOWEL DISPOSABLE) ×2 IMPLANT
UNDERPAD 30X36 HEAVY ABSORB (UNDERPADS AND DIAPERS) ×2 IMPLANT
WATER STERILE IRR 1000ML POUR (IV SOLUTION) ×2 IMPLANT

## 2021-12-01 NOTE — Op Note (Signed)
° ° °  Patient name: [name] [name] MRN: [id number] DOB: [birth year] Sex: female  12/01/2021 Pre-operative Diagnosis: ESRD Post-operative diagnosis:  Same Surgeon:  Annamarie Major Assistants:  Ivin Booty, PA Procedure:   Left upper arm Gore-Tex graft (4 x 7) Anesthesia:  General Blood Loss:  minimal Specimens:  none  Findings: End-to-side venous anastomosis.  Healthy-appearing artery about 4 mm.  Exparel was placed in the incision and tunnel  Indications: This is a 71 year old female, new start to dialysis.  Vein mapping did not show any adequate surface veins.  She comes in today for left upper arm graft.  Procedure:  The patient was identified in the holding area and taken to [place] [facility]  The patient was then placed supine on the table. general anesthesia was administered.  The patient was prepped and draped in the usual sterile fashion.  A time out was called and antibiotics were administered.  A PA was necessary to expedite the procedure.  She assisted with exposure using traction and countertraction as well as suctioning.  She helped with the anastomosis by following the suture.  She also facilitated with wound closure.  Ultrasound was used to evaluate the surface veins in the left arm.  None appeared adequate.  I made a longitudinal incision just proximal to the antecubital crease.  Through this incision I dissected out the brachial artery which was a disease-free 4 mm artery.  I then made a curvilinear incision up near the axilla.  Through this incision I dissected out the axillary vein which was a 1 cm vein.  Next, I marked the location for the tunnel on the skin and then infiltrated 80 cc of Exparel in the anticipated tunnel location as well as the 2 incisions.  I then used a curved Gore tunneler to create a tunnel between the 2 incisions.  A 7 x 4 Gore-Tex graft was brought through the tunnel.  Next, the brachial artery was occluded with vascular clamps.  No heparin was given.  An  arteriotomy was made with a #11 blade which was extended longitudinally with Potts scissors.  The 4 mm end of the graft within beveled to fit the size of the arteriotomy in a running anastomosis was created with 6-0 Prolene.  Prior to completion, appropriate flushing maneuvers were performed.  The anastomosis was completed and the clamps were released.  There was excellent flow to the graft.  Next, attention was turned towards the axillary incision.  The vein was occluded with vascular clamps and a #11 blade was used to make a venotomy which was extended longitudinally with Potts scissors.  The graft was pulled to the appropriate length and beveled to fit the size the venotomy.  A running end-to-side anastomosis was created with 6-0 Prolene.  After flushing had been performed the clamps were released.  There was an excellent thrill within the graft.  The patient had a palpable radial pulse.  The wounds were then irrigated.  Both incisions were closed by reapproximating the subcutaneous tissue with 3-0 Vicryl followed by subcuticular closure and Dermabond.  There were no immediate complications.  The patient was successfully extubated.   Disposition: To PACU stable.   Theotis Burrow, M.D., [facility] [place] Office: [phone] Pager:  [phone]

## 2021-12-01 NOTE — Interval H&P Note (Signed)
History and Physical Interval Note:  12/01/2021 12:29 PM  [name] [name]  has presented today for surgery, with the diagnosis of CKD V.  The various methods of treatment have been discussed with the patient and family. After consideration of risks, benefits and other options for treatment, the patient has consented to  Procedure(s): INSERTION OF LEFT ARM ARTERIOVENOUS (AV) GORE-TEX GRAFT (Left) as a surgical intervention.  The patient's history has been reviewed, patient examined, no change in status, stable for surgery.  I have reviewed the patient's chart and labs.  Questions were answered to the patient's satisfaction.     Annamarie Major

## 2021-12-01 NOTE — Progress Notes (Addendum)
HD#8 SUBJECTIVE:  Patient Summary: [name] [name] is a 71 y.o. with a pertinent PMH of DM2 and CKD stage 5, who presented with LOC and admitted for syncope.   Overnight Events: HD yesterday. AVG today.     Interm History: Patient seen and evaluated.Reports feeling poorly today. Worn out after HD yesterday. Complains of pain in bilateral legs and lower back.   OBJECTIVE:  Vital Signs: Vitals:   12/01/21 0850 12/01/21 1046 12/01/21 1105 12/01/21 1215  BP:  (!) 108/47 (!) 115/53 (!) 151/74  Pulse:  80 83 82  Resp:  18 18 20   Temp:  98.2 F (36.8 C) 98.4 F (36.9 C) 99 F (37.2 C)  TempSrc:  Oral Oral Oral  SpO2: 94% 95% 98% 95%  Weight:    89.6 kg  Height:    5\' 3"  (1.6 m)   Constitutional: Well-developed, well-nourished,  alert Neck: Normal range of motion.  Cardiovascular: Normal rate, regular rhythm, intact distal pulses. No gallop and no friction rub. No murmur heard. rope-like mass in right antecubital fossa. HD cath in place right upper chest Pulmonary: Breathing appears improved today. Comfortable with normal work of breathing while not wearing [place].  Abdominal: Soft.  +bowel sounds. Non distended. No tenderness, nonperitonitic Musculoskeletal: Normal range of motion. Tender lower extremities, right more edematous than left, nonpitting Neurological: Awake and alert. Responds appropriately.   Skin: wound left shin. Edematous, indurated, and erythema of left antecubital fossa. Rope-like tender mass palpable in right AC fossa.    Pertinent Labs: CBC Latest Ref Rng & Units 11/30/2021 11/29/2021 11/28/2021  WBC 4.0 - 10.5 K/uL 10.6(H) 12.9(H) 12.5(H)  Hemoglobin 12.0 - 15.0 g/dL 7.4(L) 7.4(L) 8.3(L)  Hematocrit 36.0 - 46.0 % 24.6(L) 23.7(L) 27.5(L)  Platelets 150 - 400 K/uL 242 214 228    CMP Latest Ref Rng & Units 12/01/2021 11/30/2021 11/29/2021  Glucose 70 - 99 mg/dL 131(H) 136(H) 244(H)  BUN 8 - 23 mg/dL 99(H) 164(H) 163(H)  Creatinine 0.44 - 1.00 mg/dL 2.29(H)  3.07(H) 3.49(H)  Sodium 135 - 145 mmol/L 135 137 134(L)  Potassium 3.5 - 5.1 mmol/L 3.7 3.9 4.3  Chloride 98 - 111 mmol/L 97(L) 97(L) 95(L)  CO2 22 - 32 mmol/L 26 28 24   Calcium 8.9 - 10.3 mg/dL 9.6 10.1 10.0  Total Protein 6.5 - 8.1 g/dL - - -  Total Bilirubin 0.3 - 1.2 mg/dL - - -  Alkaline Phos 38 - 126 U/L - - -  AST 15 - 41 U/L - - -  ALT 0 - 44 U/L - - -     ASSESSMENT/PLAN:   Assessment:  #Newly Dialysis Dependent ESRD - after lengthy [place] discussions, patient is agreeable to dialysis. Nephro and VVS are following patient. BUN and Cr improved after dialysis. She is scheduled to undergo dialysis again today after AVG placement with VVS. Pt given 1 unit RBC prior to AVG. Will continue to monitor BMP daily. She has been accepted at [facility] and will be initiated on TTS schedule once discharged. - Avoid nephrotoxic drugs and dose medications per GFR. She has neuropathic pain in her legs and can be given small doses 100mg  Gabapentin as needed for a maximum of 300mg  daily. For more severe pain, dilaudid can be given.  - Continue Sevelamer for her hyperphosphatemia  - Strict I&Os, daily weights, telemetry  - Vascular surgery planning for AVG today  #Acute Hypoxic Respiratory Failure 2/2 pulmonary edema and Possible COPD exacerbation  Patient had been reporting  shortness of breath and was found to have pulmonary edema on CXR. She improved with IV lasix. ARF has now resolved. - She  uses Symbicort at home and was started on Breo, duonebs, and prednisone for suspected COPD exacerbation. She has completed her steroid course. Breathing status has improved since initiation of breathing treatments as well. She is breathing comfortably and satting 100% on RA.  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 Initially noted on CT scan 2020. Due to its enlarging nature, nodule was felt to be malignant and further further evaluated with dedicated chest CT which demonstrated the LLL nodule had greatly enlarged from 2020 concerning for primary lung malignancy.  Additionally, a new right middle lobe nodule seen on imaging, possible post-infectious/inflammatory was also observed and will require follow up to exclude metastasis.  - PCCM has been consulted. No plans for Bx this admission. She has been scheduled for outpatient PET scan and follow up in their office.    #Acute on Chronic HFmrEF Patient's echo 02/2021.  EF noted to be 45 to 50% at that time.  There was no wall motion abnormalities.  She was noted to have left ventricular hypertrophy, no valvulopathy, left atrium was noted to be severely dilated. ECHO shows mild worsening of EF to 40-45% in setting of worsening uremia.  Patient is on torsemide milligrams twice daily per EMR as well as metolazone 3 times a week. Her Home diuretics are being held for now. Plan to d/c now given plans for ESRD.  - Strict I&Os - Daily weights   IV infiltration vs superficial thrombosis vs cellulitis of left AC fossa - vascular US obtained showing superficial thrombus as well as DVT in ulnar vein. Superficial thrombus able to be managed with warm compresses. More distal DVT has low probability of embolizing and can be observed at this time.  Cannot exclude possibility of cellulitis given leukocytosis, though she has been afebrile which is more suggestive of reactive leukocytosis. Will continue on abx for superficial cellulitis at this time. Bcx collected and these will need to be negative effectively ruling out thrombophlebitis prior to proceeding with vascular access for HD. Fortunately, blood cultures have been negative thus far.  - she has completed a course of abx.  #Chronic Pain, Chronic use of opioids - has not been receiving her home oxycodone here. On review  of PDMP, she receives #120 tablets of percocet 10-325 a month and fills regularly. With her progressive CKD, we have added oral dilaudid PRN rather than resuming her home oxy.  - Oral dilaudid PRN    #Constipation Generalized abdominal pain. Imaging revealed large stool burden. - miralax and senna daily    #T2DM Patient's last A1c was 02/2021 and noted to be 6.8.  Per EMR she takes 75/25 insulin 40 units in the a.m. and 30 units in the evening as well as Trulicity 1.5 mg weekly.  Patient is also on Reglan with 3 times daily dosing. -We will start patient on sliding scale insulin and continue her home Reglan.  Will uptitrate as necessary.  #Renal mass  Patient noted to have interval enlargement of L kidney exophytic density (now 3.2 cm) compared to 02/2021 imaging - patient underwent MRI imaging of this mass. 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 Patient was noted to be significantly iron deficient on labs 09/2021 with a ferritin of 11 and a transferrin saturation of 7%.  She did receive IV iron in the past for this most recently, Feraheme 10/04/2021. - she has been started on IV iron. Will plan to follow daily CBC  Signature: Delene Ruffini, MD  Internal Medicine Resident, PGY-1 [facility]  Pager: [phone] 2:47 PM, 12/01/2021   Please contact the on call pager after 5 pm and on weekends at [phone].

## 2021-12-01 NOTE — Progress Notes (Addendum)
Patient ID: [name] [name], female   DOB: 28-Nov-1950, 71 y.o.   MRN: [id number] S: Still feels "rough" and doesn't remember dialysis yesterday. O:BP (!) 93/44 (BP Location: Right Arm)    Pulse 79    Temp 98.5 F (36.9 C) (Oral)    Resp 19    Ht '5\' 3"'  (1.6 m)    Wt 89.6 kg    SpO2 94%    BMI 34.99 kg/m   Intake/Output Summary (Last 24 hours) at 12/01/2021 0929 Last data filed at 12/01/2021 0420 Gross per 24 hour  Intake 340 ml  Output 1750 ml  Net -1410 ml   Intake/Output: I/O last 3 completed shifts: In: 460 [P.O.:360; IV Piggyback:100] Out: 8938 [Urine:750; Other:1000]  Intake/Output this shift:  No intake/output data recorded. Weight change: -3.1 kg Gen:NAD CVS: RRR Resp:CTA Abd: +BS, soft, NT/ND Ext: 1+ edema of RLE and RUE  Recent Labs  Lab 11/25/21 0110 11/26/21 0122 11/27/21 0148 11/28/21 0311 11/29/21 0313 11/30/21 0745 12/01/21 0752  NA 141 139 138 138 134* 137 135  K 4.0 4.1 3.8 3.8 4.3 3.9 3.7  CL 98 98 98 97* 95* 97* 97*  CO2 '30 27 27 30 24 28 26  ' GLUCOSE 154* 240* 191* 146* 244* 136* 131*  BUN 169* 157* 146* 147* 163* 164* 99*  CREATININE 2.98* 2.94* 2.75* 2.85* 3.49* 3.07* 2.29*  ALBUMIN 3.6  --  3.7 3.1* 3.0* 3.1* 3.0*  CALCIUM 10.0 10.1 10.9* 10.7* 10.0 10.1 9.6  PHOS 4.3  --   --  4.8* 3.9 4.1 3.9  AST 18  --  18  --   --   --   --   ALT 9  --  13  --   --   --   --    Liver Function Tests: Recent Labs  Lab 11/25/21 0110 11/27/21 0148 11/28/21 0311 11/29/21 0313 11/30/21 0745 12/01/21 0752  AST 18 18  --   --   --   --   ALT 9 13  --   --   --   --   ALKPHOS 45 50  --   --   --   --   BILITOT 0.2* 0.8  --   --   --   --   PROT 6.3* 6.7  --   --   --   --   ALBUMIN 3.6 3.7   < > 3.0* 3.1* 3.0*   < > = values in this interval not displayed.   No results for input(s): LIPASE, AMYLASE in the last 168 hours. No results for input(s): AMMONIA in the last 168 hours. CBC: Recent Labs  Lab 11/26/21 1147 11/27/21 0148 11/28/21 0311  11/29/21 0313 11/30/21 0745  WBC 21.0* 19.8* 12.5* 12.9* 10.6*  NEUTROABS 19.4*  --   --   --   --   HGB 8.8* 8.9* 8.3* 7.4* 7.4*  HCT 28.8* 28.9* 27.5* 23.7* 24.6*  MCV 87.5 87.3 89.6 86.5 86.0  PLT 282 263 228 214 242   Cardiac Enzymes: No results for input(s): CKTOTAL, CKMB, CKMBINDEX, TROPONINI in the last 168 hours. CBG: Recent Labs  Lab 11/30/21 0934 11/30/21 1220 11/30/21 1613 11/30/21 2056 12/01/21 0754  GLUCAP 132* 153* 203* 211* 134*    Iron Studies: No results for input(s): IRON, TIBC, TRANSFERRIN, FERRITIN in the last 72 hours. Studies/Results: IR Fluoro Guide CV Line Right  Result Date: 11/30/2021 INDICATION: 71 year old female referred for tunneled hemodialysis catheter EXAM: TUNNELED CENTRAL VENOUS HEMODIALYSIS CATHETER  PLACEMENT WITH ULTRASOUND AND FLUOROSCOPIC GUIDANCE MEDICATIONS: 2 g Ancef. The antibiotic was given in an appropriate time interval prior to skin puncture. ANESTHESIA/SEDATION: Moderate (conscious) sedation was not employed during this procedure. A total of Versed 0 mg and Fentanyl 25 mcg was administered intravenously by the radiology nurse. Total intra-service moderate Sedation Time: 0 minutes. The patient's level of consciousness and vital signs were monitored continuously by radiology nursing throughout the procedure under my direct supervision. FLUOROSCOPY: Radiation Exposure Index (as provided by the fluoroscopic device): 1 mGy Kerma COMPLICATIONS: None PROCEDURE: Informed written consent was obtained from the patient after a discussion of the risks, benefits, and alternatives to treatment. Questions regarding the procedure were encouraged and answered. The right neck and chest were prepped with chlorhexidine in a sterile fashion, and a sterile drape was applied covering the operative field. Maximum barrier sterile technique with sterile gowns and gloves were used for the procedure. A timeout was performed prior to the initiation of the procedure.  Ultrasound survey was performed. The right internal jugular vein was confirmed to be patent, with images stored and sent to PACS. Micropuncture kit was utilized to access the right internal jugular vein under direct, real-time ultrasound guidance after the overlying soft tissues were anesthetized with 1% lidocaine with epinephrine. Stab incision was made with 11 blade scalpel. Microwire was passed centrally. The microwire was then marked to measure appropriate internal catheter length. External tunneled length was estimated. A total tip to cuff length of 19 cm was selected. 035 guidewire was advanced to the level of the IVC. Skin and subcutaneous tissues of chest wall below the clavicle were generously infiltrated with 1% lidocaine for local anesthesia. A small stab incision was made with 11 blade scalpel. The selected hemodialysis catheter was tunneled in a retrograde fashion from the anterior chest wall to the venotomy incision. Serial dilation was performed and then a peel-away sheath was placed. The catheter was then placed through the peel-away sheath with tips ultimately positioned within the superior aspect of the right atrium. Final catheter positioning was confirmed and documented with a spot radiographic image. The catheter aspirates and flushes normally. The catheter was flushed with appropriate volume heparin dwells. The catheter exit site was secured with a 0-Prolene retention suture. Gel-Foam slurry was infused into the soft tissue tract. The venotomy incision was closed Derma bond and sterile dressing. Dressings were applied at the chest wall. Patient tolerated the procedure well and remained hemodynamically stable throughout. No complications were encountered and no significant blood loss encountered. IMPRESSION: Status post right IJ tunneled hemodialysis catheter. Signed, Dulcy Fanny. Dellia Nims, RPVI [facility] Radiology Electronically Signed   By:  Corrie Mckusick D.O.   On: 11/30/2021 12:56   IR US Guide Vasc Access Right  Result Date: 11/30/2021 INDICATION: 71 year old female referred for tunneled hemodialysis catheter EXAM: TUNNELED CENTRAL VENOUS HEMODIALYSIS CATHETER PLACEMENT WITH ULTRASOUND AND FLUOROSCOPIC GUIDANCE MEDICATIONS: 2 g Ancef. The antibiotic was given in an appropriate time interval prior to skin puncture. ANESTHESIA/SEDATION: Moderate (conscious) sedation was not employed during this procedure. A total of Versed 0 mg and Fentanyl 25 mcg was administered intravenously by the radiology nurse. Total intra-service moderate Sedation Time: 0 minutes. The patient's level of consciousness and vital signs were monitored continuously by radiology nursing throughout the procedure under my direct supervision. FLUOROSCOPY: Radiation Exposure Index (as provided by the fluoroscopic device): 1 mGy Kerma COMPLICATIONS: None PROCEDURE: Informed written consent was obtained from the patient after a discussion of  the risks, benefits, and alternatives to treatment. Questions regarding the procedure were encouraged and answered. The right neck and chest were prepped with chlorhexidine in a sterile fashion, and a sterile drape was applied covering the operative field. Maximum barrier sterile technique with sterile gowns and gloves were used for the procedure. A timeout was performed prior to the initiation of the procedure. Ultrasound survey was performed. The right internal jugular vein was confirmed to be patent, with images stored and sent to PACS. Micropuncture kit was utilized to access the right internal jugular vein under direct, real-time ultrasound guidance after the overlying soft tissues were anesthetized with 1% lidocaine with epinephrine. Stab incision was made with 11 blade scalpel. Microwire was passed centrally. The microwire was then marked to measure appropriate internal catheter length. External tunneled length was estimated. A total tip to  cuff length of 19 cm was selected. 035 guidewire was advanced to the level of the IVC. Skin and subcutaneous tissues of chest wall below the clavicle were generously infiltrated with 1% lidocaine for local anesthesia. A small stab incision was made with 11 blade scalpel. The selected hemodialysis catheter was tunneled in a retrograde fashion from the anterior chest wall to the venotomy incision. Serial dilation was performed and then a peel-away sheath was placed. The catheter was then placed through the peel-away sheath with tips ultimately positioned within the superior aspect of the right atrium. Final catheter positioning was confirmed and documented with a spot radiographic image. The catheter aspirates and flushes normally. The catheter was flushed with appropriate volume heparin dwells. The catheter exit site was secured with a 0-Prolene retention suture. Gel-Foam slurry was infused into the soft tissue tract. The venotomy incision was closed Derma bond and sterile dressing. Dressings were applied at the chest wall. Patient tolerated the procedure well and remained hemodynamically stable throughout. No complications were encountered and no significant blood loss encountered. IMPRESSION: Status post right IJ tunneled hemodialysis catheter. Signed, Dulcy Fanny. Dellia Nims, RPVI [facility] Radiology Electronically Signed   By: Corrie Mckusick D.O.   On: 11/30/2021 12:56    sodium chloride   Intravenous Once   carvedilol  12.5 mg Oral BID   Chlorhexidine Gluconate Cloth  6 each Topical Q0600   fluticasone furoate-vilanterol  1 puff Inhalation Daily   heparin  5,000 Units Subcutaneous Q8H   hydrALAZINE  50 mg Oral Q8H   insulin aspart  0-9 Units Subcutaneous TID WC   insulin aspart  5 Units Subcutaneous TID WC   insulin glargine-yfgn  15 Units Subcutaneous Daily   levothyroxine  200 mcg Oral Q0600   metoCLOPramide  5 mg Oral TID   pantoprazole  40 mg Oral QHS    senna  2 tablet Oral QHS   sevelamer carbonate  800 mg Oral TID WC   traZODone  50 mg Oral QHS    BMET    Component Value Date/Time   NA 135 12/01/2021 0752   NA 146 (H) 12/23/2013 1132   K 3.7 12/01/2021 0752   CL 97 (L) 12/01/2021 0752   CO2 26 12/01/2021 0752   GLUCOSE 131 (H) 12/01/2021 0752   BUN 99 (H) 12/01/2021 0752   BUN 31 (H) 12/23/2013 1132   CREATININE 2.29 (H) 12/01/2021 0752   CREATININE 3.51 (HH) 10/17/2020 1234   CREATININE 1.32 (H) 06/29/2014 1100   CALCIUM 9.6 12/01/2021 0752   CALCIUM 9.1 09/07/2021 1036   GFRNONAA 22 (L) 12/01/2021 0752   GFRNONAA 13 (  L) 10/17/2020 1234   GFRAA 15 (L) 04/22/2020 1129   CBC    Component Value Date/Time   WBC 10.6 (H) 11/30/2021 0745   RBC 2.86 (L) 11/30/2021 0745   HGB 7.4 (L) 11/30/2021 0745   HCT 24.6 (L) 11/30/2021 0745   HCT 24.6 (L) 10/17/2020 1235   PLT 242 11/30/2021 0745   MCV 86.0 11/30/2021 0745   MCH 25.9 (L) 11/30/2021 0745   MCHC 30.1 11/30/2021 0745   RDW 20.5 (H) 11/30/2021 0745   RDW 15.7 (H) 12/23/2013 1132   LYMPHSABS 0.5 (L) 11/26/2021 1147   LYMPHSABS 2.5 12/23/2013 1132   MONOABS 0.9 11/26/2021 1147   EOSABS 0.1 11/26/2021 1147   EOSABS 0.1 12/23/2013 1132   BASOSABS 0.0 11/26/2021 1147   BASOSABS 0.0 12/23/2013 1132      Assessment/Plan:   Non-Oliguric AKI/azotemia: Creatinine is back to baseline and BUN improving daily.  However, BUN fairly high.  Unclear what caused BUN elevation to be so high out of proportion to creatinine.  She has not demonstrated signs of uremia thus far although had been confused over the weekend. I suspect this is delirium and not uremia given the BUN has continued to trend down  Has had progressive CKD and is now ESRD.  S/p [facility] placement 2/23 followed by her first HD session.  Plan for second HD today after AV access placement by Dr. Carlis Abbott.  Dose meds for GFR which is now <15 ml/min Monitor Daily I/Os, Daily weight  Maintain MAP>65 for optimal renal  perfusion. Avoid nephrotoxic medications including NSAIDs, IV Contrast and phosphate containing bowel preps (FLEETS)  Use synthetic opioids (Fentanyl/Dilaudid) if needed Dr. Carlis Abbott to proceed with AVG placement today Has outpatient spot at [facility] on TTS schedule when stable for discharge Congestive heart failure: EF around 40 to 75% with diastolic dysfunction.  Lower extremity edema persistent but also may be multifactorial.  Will manage with HD  Left lower Lung nodule - concerning for malignancy and PET scan was recommended.  CT scan of chest showed a new right middle lobe nodule as well concerning for mets.  Dr. Valeta Harms consulted and will arrange for PET scan before attempt at bronchoscopy and biopsy as an outpatient. COPD - longtime smoker.  Continue with nebs per primary.  Likely etiology of SOB.   Hyperphosphatemia: Sevelamer  Hypercalemia - not on vitamin D or calcium supplements, likely due to immobility.  Ambulate as tolerated but may also be due to malignancy as above.  Anemia of CKD: Iron deficiency.  IV iron ordered.  Monitor hemoglobin trend.  Hold off on ESA for now given possible lung cancer. 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  Follow-up 3 to 6 months. HTN: Coreg 12.5 mg twice daily.  Blood pressure goal Right lower extremity edema: Predominantly on the right side.  Had PVL of the right leg on 2/15 that was negative for DVT.  Improved.  Donetta Potts, MD [facility]

## 2021-12-01 NOTE — Transfer of Care (Signed)
Immediate Anesthesia Transfer of Care Note  Patient: [name] [name]  Procedure(s) Performed: INSERTION OF LEFT ARM ARTERIOVENOUS (AV) GORE-TEX GRAFT (Left)  Patient Location: PACU  Anesthesia Type:General  Level of Consciousness: awake  Airway & Oxygen Therapy: Patient Spontanous Breathing and Patient connected to face mask oxygen  Post-op Assessment: Report given to RN and Post -op Vital signs reviewed and stable  Post vital signs: Reviewed and stable  Last Vitals:  Vitals Value Taken Time  BP 128/61 12/01/21 1513  Temp    Pulse 81 12/01/21 1521  Resp 16 12/01/21 1521  SpO2 98 % 12/01/21 1521  Vitals shown include unvalidated device data.  Last Pain:  Vitals:   12/01/21 1245  TempSrc:   PainSc: 7       Patients Stated Pain Goal: 0 (40/97/35 3299)  Complications: No notable events documented.

## 2021-12-01 NOTE — Anesthesia Postprocedure Evaluation (Signed)
Anesthesia Post Note  Patient: [name] [name]  Procedure(s) Performed: INSERTION OF LEFT ARM ARTERIOVENOUS (AV) GORE-TEX GRAFT (Left)     Patient location during evaluation: PACU Anesthesia Type: General Level of consciousness: awake and alert Pain management: pain level controlled Vital Signs Assessment: post-procedure vital signs reviewed and stable Respiratory status: spontaneous breathing, nonlabored ventilation, respiratory function stable and patient connected to nasal cannula oxygen Cardiovascular status: blood pressure returned to baseline and stable Postop Assessment: no apparent nausea or vomiting Anesthetic complications: no   No notable events documented.  Last Vitals:  Vitals:   12/01/21 1630 12/01/21 1650  BP: (!) 115/49 (!) 121/54  Pulse: 72 82  Resp: 16 16  Temp: 36.9 C 36.8 C  SpO2: 96% 98%    Last Pain:  Vitals:   12/01/21 1650  TempSrc: Oral  PainSc:                  Effie Berkshire

## 2021-12-01 NOTE — Discharge Instructions (Signed)
[facility]  Discharge Instructions  AV Fistula or Graft Surgery for Dialysis Access  Please refer to the following instructions for your post-procedure care. Your surgeon or physician assistant will discuss any changes with you.  Activity  You may drive the day following your surgery, if you are comfortable and no longer taking prescription pain medication. Resume full activity as the soreness in your incision resolves.  Bathing/Showering  You may shower after you go home. Keep your incision dry for 48 hours. Do not soak in a bathtub, hot tub, or swim until the incision heals completely. You may not shower if you have a hemodialysis catheter.  Incision Care  Clean your incision with mild soap and water after 48 hours. Pat the area dry with a clean towel. You do not need a bandage unless otherwise instructed. Do not apply any ointments or creams to your incision. You may have skin glue on your incision. Do not peel it off. It will come off on its own in about one week. Your arm may swell a bit after surgery. To reduce swelling use pillows to elevate your arm so it is above your heart. Your doctor will tell you if you need to lightly wrap your arm with an ACE bandage.  Diet  Resume your normal diet. There are not special food restrictions following this procedure. In order to heal from your surgery, it is CRITICAL to get adequate nutrition. Your body requires vitamins, minerals, and protein. Vegetables are the best source of vitamins and minerals. Vegetables also provide the perfect balance of protein. Processed food has little nutritional value, so try to avoid this.  Medications  Resume taking all of your medications. If your incision is causing pain, you may take over-the counter pain relievers such as acetaminophen (Tylenol). If you were prescribed a stronger pain medication, please be aware these medications can cause nausea and constipation. Prevent  nausea by taking the medication with a snack or meal. Avoid constipation by drinking plenty of fluids and eating foods with high amount of fiber, such as fruits, vegetables, and grains.  Do not take Tylenol if you are taking prescription pain medications.  Follow up Your surgeon may want to see you in the office following your access surgery. If so, this will be arranged at the time of your surgery.  Please call us immediately for any of the following conditions:  Increased pain, redness, drainage (pus) from your incision site Fever of 101 degrees or higher Severe or worsening pain at your incision site Hand pain or numbness.  Reduce your risk of vascular disease:  Stop smoking. If you would like help, call [facility] at 1-800-QUIT-NOW 6238000125) or [place] [facility] [place] your cholesterol Maintain a desired weight Control your diabetes Keep your blood pressure down  Dialysis  It will take several weeks to several months for your new dialysis access to be ready for use. Your surgeon will determine when it is okay to use it. Your nephrologist will continue to direct your dialysis. You can continue to use your Permcath until your new access is ready for use.   12/01/2021 [name] [name] [id number] 11/13/1950  Surgeon(s): Serafina Mitchell, MD  Procedure(s): INSERTION OF LEFT ARM ARTERIOVENOUS (AV) GORE-TEX GRAFT   May stick graft immediately   May stick graft on designated area only:   X Do not stick Left AV Graft for 4 weeks    If you have any questions, please call  the office at [phone].

## 2021-12-01 NOTE — Progress Notes (Signed)
Occupational Therapy Treatment Patient Details Name: [name] [name] MRN: [id number] DOB: Jun 19, 1951 Today's Date: 12/01/2021   History of present illness Pt is a 71 year old woman who came to the ED on 11/21/21 after syncopal after straining on toilet (thought to be vasovagal), abdominal pain, vomiting and constipation. Pulmonary and renal imaging concerning for malignancy. PMH: COPD, CKD5, CHF with EF of 40-45%, OSA, gout, iron deficiency, low back pain, LE venous insufficiency with L LE wound, HTN, TIA, DM2.   OT comments  Pt receiving blood, reports she is to have a procedure at 1 pm, but unable to state what kind of procedure. Pt participated in seated grooming at EOB, min for oral care, set up for washing face and hands, max assist to comb and style hair. Pt continues to have signficant pain in R UE and now leading with L hand during ADLs. Per chart, pt is planning to discharge to her sister's home. Will continue to monitor progress and update d/c recommendations. Pt has had an extended, complicated hospitalization and may need post acute rehab.    Recommendations for follow up therapy are one component of a multi-disciplinary discharge planning process, led by the attending physician.  Recommendations may be updated based on patient status, additional functional criteria and insurance authorization.    Follow Up Recommendations  Home health OT    Assistance Recommended at Discharge Frequent or constant Supervision/Assistance  Patient can return home with the following  A little help with walking and/or transfers;A lot of help with bathing/dressing/bathroom;Assistance with cooking/housework;Assist for transportation;Help with stairs or ramp for entrance   Equipment Recommendations  None recommended by OT    Recommendations for Other Services      Precautions / Restrictions Precautions Precautions: Fall Precaution Comments: partial stand pivots at baseline, does better without RW,  painful R UE       Mobility Bed Mobility Overal bed mobility: Needs Assistance Bed Mobility: Supine to Sit, Sit to Supine     Supine to sit: Mod assist Sit to supine: Mod assist   General bed mobility comments: HOB up, assist to raise trunk and for hips to EOB using bed pad    Transfers                   General transfer comment: deferred, pt getting blood, transport arrived at end of session for procedure     Balance Overall balance assessment: Needs assistance   Sitting balance-Leahy Scale: Fair                                     ADL either performed or assessed with clinical judgement   ADL Overall ADL's : Needs assistance/impaired     Grooming: Oral care;Wash/dry hands;Wash/dry face;Sitting;Minimal assistance;Brushing hair;Maximal assistance Grooming Details (indicate cue type and reason): pt NPO for procedure, but happy to brush her teeth, pt using L hand as lead, max assist to comb hair and put in elastic                                    Extremity/Trunk Assessment Upper Extremity Assessment RUE Deficits / Details: continues to be painful, elevated on pillows, has stopped using as lead during ADLs RUE: Shoulder pain with ROM RUE Coordination: decreased gross motor            Vision  Perception     Praxis      Cognition Arousal/Alertness: Awake/alert Behavior During Therapy: WFL for tasks assessed/performed Overall Cognitive Status: Impaired/Different from baseline Area of Impairment: Memory, Attention, Following commands, Problem solving                   Current Attention Level: Sustained Memory: Decreased short-term memory Following Commands: Follows one step commands with increased time, Follows one step commands consistently     Problem Solving: Slow processing General Comments: pt able to recall she has a procedure at 1:00, but not what it was for, did not recall she was getting blood,  decreased problem solving to turn volume up on her cell phone        Exercises      Shoulder Instructions       General Comments      Pertinent Vitals/ Pain       Pain Assessment Pain Assessment: Faces Faces Pain Scale: Hurts even more Pain Location: R UE Pain Descriptors / Indicators: Sore, Discomfort, Grimacing, Guarding Pain Intervention(s): Monitored during session, Repositioned  Home Living                                          Prior Functioning/Environment              Frequency  Min 2X/week        Progress Toward Goals  OT Goals(current goals can now be found in the care plan section)  Progress towards OT goals: Not progressing toward goals - comment (pt with no functional use of R UE due to pain, increasing weakness)  Acute Rehab OT Goals OT Goal Formulation: With patient Time For Goal Achievement: 12/07/21 Potential to Achieve Goals: [place] Discharge plan remains appropriate (may need to update next session)    Co-evaluation                 AM-PAC OT "6 Clicks" Daily Activity     Outcome Measure   Help from another person eating meals?: A Little Help from another person taking care of personal grooming?: A Lot Help from another person toileting, which includes using toliet, bedpan, or urinal?: Total Help from another person bathing (including washing, rinsing, drying)?: A Lot Help from another person to put on and taking off regular upper body clothing?: A Lot Help from another person to put on and taking off regular lower body clothing?: Total 6 Click Score: 11    End of Session    OT Visit Diagnosis: Unsteadiness on feet (R26.81);Other abnormalities of gait and mobility (R26.89);Muscle weakness (generalized) (M62.81);Pain   Activity Tolerance Patient tolerated treatment well   Patient Left in bed;with call bell/phone within reach   Nurse Communication          Time: 1120-1140 OT Time Calculation  (min): 20 min  Charges: OT General Charges $OT Visit: 1 Visit OT Treatments $Self Care/Home Management : 8-22 mins Nestor Lewandowsky, OTR/L Acute Rehabilitation Services Pager: [phone] Office: [phone]  [name] 12/01/2021, 11:54 AM

## 2021-12-01 NOTE — Progress Notes (Signed)
PT Cancellation Note  Patient Details Name: [name] [name] MRN: [id number] DOB: 1951-10-02   Cancelled Treatment:    Reason Eval/Treat Not Completed: Patient at procedure or test/unavailable (AVL placement, will re-attempt as schedule permits)   Rutherford Limerick 12/01/2021, 1:11 PM

## 2021-12-01 NOTE — Anesthesia Procedure Notes (Signed)
Procedure Name: Intubation Date/Time: 12/01/2021 1:40 PM Performed by: Minerva Ends, CRNA Pre-anesthesia Checklist: Patient identified, Emergency Drugs available, Suction available and Patient being monitored Patient Re-evaluated:Patient Re-evaluated prior to induction Oxygen Delivery Method: Circle system utilized Preoxygenation: Pre-oxygenation with 100% oxygen Induction Type: IV induction Ventilation: Mask ventilation without difficulty LMA: LMA inserted LMA Size: 4.0 Tube type: Oral Number of attempts: 1 Placement Confirmation: positive ETCO2 and breath sounds checked- equal and bilateral Tube secured with: Tape Dental Injury: Teeth and Oropharynx as per pre-operative assessment

## 2021-12-01 NOTE — Anesthesia Preprocedure Evaluation (Addendum)
Anesthesia Evaluation  Patient identified by MRN, date of birth, ID band Patient awake    Reviewed: Allergy & Precautions, H&P , NPO status , Patient's Chart, lab work & pertinent test results, reviewed documented beta blocker date and time   Airway Mallampati: II  TM Distance: >3 FB Neck ROM: Full    Dental no notable dental hx. (+) Edentulous Upper, Dental Advisory Given   Pulmonary asthma , sleep apnea (noncompliant w/ CPAP) , COPD,  COPD inhaler, Current Smoker and Patient abstained from smoking.,  10pack year history, still smoking   Pulmonary exam normal breath sounds clear to auscultation       Cardiovascular hypertension, Pt. on home beta blockers and Pt. on medications pulmonary hypertension (mild pHTN)+ Peripheral Vascular Disease and +CHF (LVEF 94-17%, grade 1 diastolic dysfunction)  + Valvular Problems/Murmurs (mild AS) AS  Rhythm:Regular Rate:Normal  Echo 10/2021: 1. Left ventricular ejection fraction, by estimation, is 40 to 45%. The  left ventricle has mildly decreased function. The left ventricle  demonstrates global hypokinesis. The left ventricular internal cavity size  was mildly dilated. There is mild left  ventricular hypertrophy. Left ventricular diastolic parameters are  consistent with Grade I diastolic dysfunction (impaired relaxation).  2. Right ventricular systolic function is normal. The right ventricular  size is normal. There is mildly elevated pulmonary artery systolic  pressure.  3. Left atrial size was severely dilated.  4. Right atrial size was mildly dilated.  5. The mitral valve is normal in structure. Trivial mitral valve  regurgitation. No evidence of mitral stenosis.  6. The aortic valve was not well visualized. There is mild calcification  of the aortic valve. Aortic valve regurgitation is not visualized. Mild  aortic valve stenosis. Aortic valve area, by VTI measures 1.32 cm. Aortic   valve mean gradient measures 8.0  mmHg. Aortic valve Vmax measures 2.05 m/s.  7. The inferior vena cava is dilated in size with >50% respiratory  variability, suggesting right atrial pressure of 8 mmHg.   R heart cath 2020: Low filling pressures, no significant CHF.    Neuro/Psych TIAnegative psych ROS   GI/Hepatic Neg liver ROS, hiatal hernia, GERD  Medicated and Controlled,  Endo/Other  diabetes, Well Controlled, Type 2, Insulin DependentHypothyroidism Morbid obesityObesity BMI 35 a1c 6.3  Renal/GU ESRFRenal disease  negative genitourinary   Musculoskeletal  (+) Arthritis , Osteoarthritis,    Abdominal   Peds  Hematology  (+) Blood dyscrasia, anemia , Hb 7.4, plt 242   Anesthesia Other Findings Chronic pain on home oxy  Reproductive/Obstetrics negative OB ROS                          Anesthesia Physical Anesthesia Plan  ASA: 3  Anesthesia Plan: General   Post-op Pain Management: Tylenol PO (pre-op)* and Gabapentin PO (pre-op)*   Induction: Intravenous  PONV Risk Score and Plan: 3 and Ondansetron, Dexamethasone and Treatment may vary due to age or medical condition  Airway Management Planned: LMA  Additional Equipment:   Intra-op Plan:   Post-operative Plan: Extubation in OR  Informed Consent: I have reviewed the patients History and Physical, chart, labs and discussed the procedure including the risks, benefits and alternatives for the proposed anesthesia with the patient or authorized representative who has indicated his/her understanding and acceptance.     Dental advisory given  Plan Discussed with: CRNA  Anesthesia Plan Comments:         Anesthesia Quick Evaluation

## 2021-12-01 NOTE — Progress Notes (Signed)
Initial Nutrition Assessment  DOCUMENTATION CODES:   Non-severe (moderate) malnutrition in context of chronic illness  INTERVENTION:  - Renavit daily   - Recommend liberalizing diet from renal/carb modified diet to regular diet   - Ensure Enlive po TID, each supplement provides 350 kcal and 20 grams of protein   - Encourage PO intake   NUTRITION DIAGNOSIS:   Moderate Malnutrition related to chronic illness (CKD stage V, COPD) as evidenced by mild fat depletion, moderate muscle depletion.   GOAL:   Patient will meet greater than or equal to 90% of their needs    MONITOR:   PO intake, Supplement acceptance, Labs, Weight trends, I & O's, Skin  REASON FOR ASSESSMENT:   LOS    ASSESSMENT:   Pt is a 71 y.o. female with medical history significant for chronic CHF with EF 40-45%, T2DM currently well controlled, COPD, HTN, CKD stage V, who presented to the ED after syncopal episode with abdominal pain, vomiting, constipation and was admitted for syncopal episode and for the management of CKD and azotemia.  2/22 - renal function not improving and starting iHD, PCCM consult for LLL nodule concerning for malignancy  2/23 - right IJ tunneled catheter in IR; First HD session  2/24 - Left arm AV graft procedure   Pt currently NPO today due to procedure today.   Meal documentation PO intake: 10-80% x 5 days.   Met with pt at bedside and RN present in room during time of visit administering medications to pt. Pt alert and oriented, but distressed during time of visit and complains of pain throughout her body. Per pt, pt endorses a poor appetite for the past one month and states that she typically eats once or twice a day. When asked about types of foods pt eats, pt states "whatever I want," so unclear of certain or types of foods pt typically eats. Pt denies any nausea related to her poor appetite, and just states poor appetite due to just not having an appetite. Per pt, pt lives alone  and states that a family or friend cooks and helps out with grocery shopping for her. Per pt, she drinks 2-3 Boost products a day at home and that she typically drinks strawberry flavor. Despite poor appetite reported by pt prior to admission, pt states that she is "very hungry" today, however, has been unable to eat while being in the hospital due to diet frequently being NPO. Pt states that she knows she has lost weight since being admitted due to NPO diet but cannot confirm how much weight. Could not obtain prior to admission weight history from pt as pt could not provide this information. Suspect pt's current body weight is being masked by fluid. Per physician note, pt reports she has had a lack of appetite for the past few months with unintentional weight loss of over >50#. Dietetic intern reached out to MD via secure chat about liberalizing pt's diet to regular to promote PO intake.   Per H&P note, pt is no longer able to walk and that she performs pivot (on left leg) transfers between bed and bedside commode and mobilizes with wheelchair.   Pt's phosphorus was elevated upon admission. Pt started phosphate binders and phosphorus levels have improved.   I/O's: 750 ml x 24 hours and -4.5 L since admit Post HD Net UF: 1,000 mL   Admit wt: 93.3 kg  Current wt: 89.6 kg  Per weight encounters, pt has experienced a 10% weight loss within  the past 8 months.   Labs reviewed and include:  BUN: 99 Creatinine: 2.29 Potassium: 3.7 (normal)  Corrected calcium: 10.4  Medications reviewed and include: SSI BID, semglee 15 units, synthroid, reglan, protonix, renvela TID  NUTRITION - FOCUSED PHYSICAL EXAM:  Flowsheet Row Most Recent Value  Orbital Region Moderate depletion  Upper Arm Region Severe depletion  Thoracic and Lumbar Region Unable to assess  Buccal Region Mild depletion  Temple Region Mild depletion  Clavicle Bone Region Moderate depletion  Clavicle and Acromion Bone Region Moderate  depletion  Scapular Bone Region Moderate depletion  Dorsal Hand Moderate depletion  Patellar Region Moderate depletion  Anterior Thigh Region Moderate depletion  Posterior Calf Region Moderate depletion  Edema (RD Assessment) Mild  Hair Reviewed  Eyes Reviewed  Mouth Unable to assess  Skin Reviewed  Nails Reviewed       Diet Order:   Diet Order             Diet NPO time specified  Diet effective midnight                   EDUCATION NEEDS:   Not appropriate for education at this time  Skin:  Skin Assessment: Skin Integrity Issues: Skin Integrity Issues:: Incisions Incisions: left leg; full thickness wound  Last BM:  2/23; type 6; type 5  Height:   Ht Readings from Last 1 Encounters:  12/01/21 '5\' 3"'  (1.6 m)    Weight:   Wt Readings from Last 1 Encounters:  12/01/21 89.6 kg    Ideal Body Weight:  52.2 kg  BMI:  Body mass index is 34.99 kg/m.  Estimated Nutritional Needs:   Kcal:  1900 - 2100  Protein:  95 - 110 grams  Fluid:  1000 mL + UOP    Maryruth Hancock, Dietetic Intern 12/01/2021 2:55 PM

## 2021-12-01 NOTE — Progress Notes (Addendum)
Contacted [facility] admissions this am and pt's case is still pending. Will f/u later today.  Melven Sartorius Renal Navigator [phone]  Addendum at 12:30 pm: Pt has been accepted at [facility] on a TTS schedule with 11:45 chair time. Pt will need to arrive at 11:15 for first appointment to complete paperwork prior to treatment. Went to meet with pt at bedside but she was off the floor for procedure. Left schedule letter on pt's over the bed table. Contacted pt's husband via phone to discuss appt details but had to leave a message requesting a return call. Spoke to pt's bedside RN to make her aware that letter was left on table and attempting to reach pt's husband. Contacted attending, nephrologist, and RN CM regarding pt's out-pt arrangements. Pt will likely d/c this weekend and start at clinic on Tuesday. Spoke to [facility] and they aware pt to likely start on Tuesday. Contacted renal PA regarding clinic's need for orders at d/c. Will add arrangements to pt's AVS as well.   Received a return call from pt's husband. Discussed pt's out-pt HD arrangements via phone. Will text details to pt's husband in addition to schedule letter being left for pt and info placed on AVS.

## 2021-12-02 LAB — RENAL FUNCTION PANEL
Albumin: 3.1 g/dL — ABNORMAL LOW (ref 3.5–5.0)
Anion gap: 13 (ref 5–15)
BUN: 54 mg/dL — ABNORMAL HIGH (ref 8–23)
CO2: 24 mmol/L (ref 22–32)
Calcium: 9.3 mg/dL (ref 8.9–10.3)
Chloride: 99 mmol/L (ref 98–111)
Creatinine, Ser: 2.01 mg/dL — ABNORMAL HIGH (ref 0.44–1.00)
GFR, Estimated: 26 mL/min — ABNORMAL LOW (ref >60)
Glucose, Bld: 126 mg/dL — ABNORMAL HIGH (ref 70–99)
Phosphorus: 3.7 mg/dL (ref 2.5–4.6)
Potassium: 4.3 mmol/L (ref 3.5–5.1)
Sodium: 136 mmol/L (ref 135–145)

## 2021-12-02 LAB — CULTURE, BLOOD (ROUTINE X 2)
Culture: NO GROWTH
Culture: NO GROWTH
Special Requests: ADEQUATE
Special Requests: ADEQUATE

## 2021-12-02 LAB — BPAM RBC
Blood Product Expiration Date: 202303262359
ISSUE DATE / TIME: 202302241035
Unit Type and Rh: 5100

## 2021-12-02 LAB — GLUCOSE, CAPILLARY
Glucose-Capillary: 124 mg/dL — ABNORMAL HIGH (ref 70–99)
Glucose-Capillary: 136 mg/dL — ABNORMAL HIGH (ref 70–99)
Glucose-Capillary: 158 mg/dL — ABNORMAL HIGH (ref 70–99)
Glucose-Capillary: 188 mg/dL — ABNORMAL HIGH (ref 70–99)
Glucose-Capillary: 214 mg/dL — ABNORMAL HIGH (ref 70–99)

## 2021-12-02 LAB — TYPE AND SCREEN
ABO/RH(D): O POS
Antibody Screen: NEGATIVE
Unit division: 0

## 2021-12-02 NOTE — Progress Notes (Signed)
HD#9 SUBJECTIVE:  Patient Summary: [name] [name] is a 71 y.o. with a pertinent PMH of DM2 and CKD stage 5, who presented with LOC and admitted for syncope. Patient found to be uremic. Eventually underwent R TDC and AVG. Started on HD  Overnight Events:      Interm History: Reports she is tired after HD  and after her AV graft. She feels like her breathing and mentation are starting to improve.  OBJECTIVE:  Vital Signs: Vitals:   12/02/21 0450 12/02/21 0500 12/02/21 0921 12/02/21 0949  BP: (!) 115/51   (!) 138/52  Pulse: 75   93  Resp: 18   18  Temp: 97.8 F (36.6 C)   99 F (37.2 C)  TempSrc:    Oral  SpO2: (!) 89%  91% 91%  Weight:  92.9 kg    Height:       Constitutional: Well-developed, well-nourished,  alert Neck: Normal range of motion.  Cardiovascular: Normal rate, regular rhythm, intact distal pulses. No gallop and no friction rub. No murmur heard. rope-like mass in right antecubital fossa. HD cath in place right upper chest no surrounding erythema or drainage.  Pulmonary: Comfortable with normal work of breathing while not wearing [place].  Abdominal: Soft. +bowel sounds. Non distended. No tenderness, nonperitonitic Musculoskeletal: Normal range of motion. Tender lower extremities, right more edematous than left, nonpitting, BLE edema significantly improved.  Neurological: Awake and alert.  Skin: wound left shin. Bandaged. No drainage.   Pertinent Labs: CBC Latest Ref Rng & Units 12/01/2021 11/30/2021 11/29/2021  WBC 4.0 - 10.5 K/uL 13.4(H) 10.6(H) 12.9(H)  Hemoglobin 12.0 - 15.0 g/dL 8.8(L) 7.4(L) 7.4(L)  Hematocrit 36.0 - 46.0 % 27.8(L) 24.6(L) 23.7(L)  Platelets 150 - 400 K/uL 148(L) 242 214    CMP Latest Ref Rng & Units 12/02/2021 12/01/2021 11/30/2021  Glucose 70 - 99 mg/dL 126(H) 131(H) 136(H)  BUN 8 - 23 mg/dL 54(H) 99(H) 164(H)  Creatinine 0.44 - 1.00 mg/dL 2.01(H) 2.29(H) 3.07(H)  Sodium 135 - 145 mmol/L 136 135 137  Potassium 3.5 - 5.1 mmol/L 4.3 3.7 3.9   Chloride 98 - 111 mmol/L 99 97(L) 97(L)  CO2 22 - 32 mmol/L 24 26 28   Calcium 8.9 - 10.3 mg/dL 9.3 9.6 10.1  Total Protein 6.5 - 8.1 g/dL - - -  Total Bilirubin 0.3 - 1.2 mg/dL - - -  Alkaline Phos 38 - 126 U/L - - -  AST 15 - 41 U/L - - -  ALT 0 - 44 U/L - - -     ASSESSMENT/PLAN:   Assessment:  #Newly Dialysis Dependent ESRD - She is s/p R [facility] 2/23 and AVG 2/24. She has had 2 HD sessions thus far, with plans for HD this PM. She has been accepted at [place] and will be initiated on TTS schedule once discharged. - Avoid nephrotoxic drugs and dose medications per GFR. She has neuropathic pain in her legs and can be given small doses 100mg  Gabapentin as needed for a maximum of 300mg  daily. For more severe pain, dilaudid can be given.  - Continue Sevelamer for her hyperphosphatemia  - Strict I&Os, daily weights, telemetry    #Acute Hypoxic Respiratory Failure 2/2 pulmonary edema and Possible COPD exacerbation  Patient had been reporting shortness of breath and was found to have pulmonary edema on CXR. She improved with IV lasix and duonebs and steroids.  - She  uses Symbicort at home and was started on Breo, duonebs, and prednisone  for suspected COPD exacerbation. She has completed her steroid course. Breathing status has improved since initiation of breathing treatments as well. She is breathing comfortably and satting 100% on RA.  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 Initially noted on CT scan 2020. Due to its enlarging nature, nodule was felt to be malignant and further further evaluated with dedicated chest CT which demonstrated the LLL nodule had greatly enlarged from 2020 concerning for primary lung malignancy.  Additionally, a new right middle lobe nodule seen on imaging, possible post-infectious/inflammatory was  also observed and will require follow up to exclude metastasis.  - PCCM has been consulted. No plans for Bx this admission. She has been scheduled for outpatient PET scan and follow up in their office.    #Acute on Chronic HFmrEF Patient's echo 02/2021.  EF noted to be 45 to 50% at that time.  There was no wall motion abnormalities.  She was noted to have left ventricular hypertrophy, no valvulopathy, left atrium was noted to be severely dilated. ECHO shows mild worsening of EF to 40-45% in setting of worsening uremia.  Patient is on torsemide milligrams twice daily per EMR as well as metolazone 3 times a week. Her Home diuretics are being held for now. Volume status continues to improve with HD.    #R Ulnar vein DVT #R cephalic vein thrombosis  #? Superficial cellulitis-resolved  - vascular US obtained showing superficial thrombosis of R cephalic as well as DVT in R ulnar vein. Superficial thrombus able to be managed with warm compresses. More distal DVT has low probability of embolizing and can be observed at this time.  -Cannot exclude possibility of cellulitis given leukocytosis, though she has been afebrile which is more suggestive of reactive leukocytosis. Blood Cx remain negative and patient completed a 5 d course of augmentin   #Chronic Pain, Chronic use of opioids - has not been receiving her home oxycodone here. On review of PDMP, she receives #120 tablets of percocet 10-325 a month and fills regularly. With her ESRD, we have added oral dilaudid PRN rather than resuming her home oxy.  - Oral dilaudid PRN    #Constipation 2x stools this AM. Continue daily bowel regimen.     #T2DM Patient's last A1c was 02/2021 and noted to be 6.8.  Per EMR she takes 75/25 insulin 40 units in the a.m. and 30 units in the evening as well as Trulicity 1.5 mg weekly.  Patient is also on Reglan with 3 times daily dosing. This hospitalization she has required Semglee 15u qd + 5u aspart WC and SSI.  -Will  continue this current regimen  #Renal mass  Patient noted to have interval enlargement of L kidney exophytic density (now 3.2 cm) compared to 02/2021 imaging - patient underwent MRI imaging of this mass. 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 Patient was noted to be significantly iron deficient on labs 09/2021 with a ferritin of 11 and a transferrin saturation of 7%.  She did receive IV iron in the past for this  most recently, Feraheme 10/04/2021. - she has been started on IV iron  Rick Duff, MD PGY-2 Internal Medicine  Pager [phone]  3:07 PM, 12/02/2021   Please contact the on call pager after 5 pm and on weekends at [phone].

## 2021-12-02 NOTE — Progress Notes (Signed)
Patient ID: [name] [name], female   DOB: 1951/04/07, 71 y.o.   MRN: [id number] S: Finished HD early this morning.  Still not feeling well and c/o right arm and bilateral leg pain. O:BP (!) 138/52 (BP Location: Right Leg)    Pulse 93    Temp 99 F (37.2 C) (Oral)    Resp 18    Ht 5\' 3"  (1.6 m)    Wt 92.9 kg    SpO2 91%    BMI 36.28 kg/m   Intake/Output Summary (Last 24 hours) at 12/02/2021 1221 Last data filed at 12/02/2021 0006 Gross per 24 hour  Intake 1040 ml  Output 2020 ml  Net -980 ml   Intake/Output: I/O last 3 completed shifts: In: 3299 [I.V.:400; Blood:590; Other:50] Out: 2270 [Urine:250; Other:2000; Blood:20]  Intake/Output this shift:  No intake/output data recorded. Weight change: -1 kg Gen:NAD CVS: RRR Resp:CTA Abd: +BS, soft, NT/ND Ext: LUE AVG +T/B, + trace edema of RLE  Recent Labs  Lab 11/26/21 0122 11/27/21 0148 11/28/21 0311 11/29/21 0313 11/30/21 0745 12/01/21 0752 12/02/21 0602  NA 139 138 138 134* 137 135 136  K 4.1 3.8 3.8 4.3 3.9 3.7 4.3  CL 98 98 97* 95* 97* 97* 99  CO2 27 27 30 24 28 26 24   GLUCOSE 240* 191* 146* 244* 136* 131* 126*  BUN 157* 146* 147* 163* 164* 99* 54*  CREATININE 2.94* 2.75* 2.85* 3.49* 3.07* 2.29* 2.01*  ALBUMIN  --  3.7 3.1* 3.0* 3.1* 3.0* 3.1*  CALCIUM 10.1 10.9* 10.7* 10.0 10.1 9.6 9.3  PHOS  --   --  4.8* 3.9 4.1 3.9 3.7  AST  --  18  --   --   --   --   --   ALT  --  13  --   --   --   --   --    Liver Function Tests: Recent Labs  Lab 11/27/21 0148 11/28/21 0311 11/30/21 0745 12/01/21 0752 12/02/21 0602  AST 18  --   --   --   --   ALT 13  --   --   --   --   ALKPHOS 50  --   --   --   --   BILITOT 0.8  --   --   --   --   PROT 6.7  --   --   --   --   ALBUMIN 3.7   < > 3.1* 3.0* 3.1*   < > = values in this interval not displayed.   No results for input(s): LIPASE, AMYLASE in the last 168 hours. No results for input(s): AMMONIA in the last 168 hours. CBC: Recent Labs  Lab 11/26/21 1147 11/27/21 0148  11/28/21 0311 11/29/21 0313 11/30/21 0745 12/01/21 1816  WBC 21.0* 19.8* 12.5* 12.9* 10.6* 13.4*  NEUTROABS 19.4*  --   --   --   --   --   HGB 8.8* 8.9* 8.3* 7.4* 7.4* 8.8*  HCT 28.8* 28.9* 27.5* 23.7* 24.6* 27.8*  MCV 87.5 87.3 89.6 86.5 86.0 85.3  PLT 282 263 228 214 242 148*   Cardiac Enzymes: No results for input(s): CKTOTAL, CKMB, CKMBINDEX, TROPONINI in the last 168 hours. CBG: Recent Labs  Lab 12/01/21 1514 12/01/21 1701 12/02/21 0046 12/02/21 0813 12/02/21 1117  GLUCAP 155* 173* 188* 124* 158*    Iron Studies: No results for input(s): IRON, TIBC, TRANSFERRIN, FERRITIN in the last 72 hours. Studies/Results: No results found.  sodium  chloride   Intravenous Once   carvedilol  12.5 mg Oral BID   Chlorhexidine Gluconate Cloth  6 each Topical Q0600   feeding supplement  237 mL Oral TID BM   fluticasone furoate-vilanterol  1 puff Inhalation Daily   gabapentin  100 mg Oral TID   heparin  5,000 Units Subcutaneous Q8H   hydrALAZINE  50 mg Oral Q8H   insulin aspart  0-9 Units Subcutaneous TID WC   insulin aspart  5 Units Subcutaneous TID WC   insulin glargine-yfgn  15 Units Subcutaneous Daily   levothyroxine  200 mcg Oral Q0600   metoCLOPramide  5 mg Oral TID   multivitamin  1 tablet Oral QHS   pantoprazole  40 mg Oral QHS   senna  2 tablet Oral QHS   sevelamer carbonate  800 mg Oral TID WC   traZODone  50 mg Oral QHS    BMET    Component Value Date/Time   NA 136 12/02/2021 0602   NA 146 (H) 12/23/2013 1132   K 4.3 12/02/2021 0602   CL 99 12/02/2021 0602   CO2 24 12/02/2021 0602   GLUCOSE 126 (H) 12/02/2021 0602   BUN 54 (H) 12/02/2021 0602   BUN 31 (H) 12/23/2013 1132   CREATININE 2.01 (H) 12/02/2021 0602   CREATININE 3.51 (HH) 10/17/2020 1234   CREATININE 1.32 (H) 06/29/2014 1100   CALCIUM 9.3 12/02/2021 0602   CALCIUM 9.1 09/07/2021 1036   GFRNONAA 26 (L) 12/02/2021 0602   GFRNONAA 13 (L) 10/17/2020 1234   GFRAA 15 (L) 04/22/2020 1129   CBC     Component Value Date/Time   WBC 13.4 (H) 12/01/2021 1816   RBC 3.26 (L) 12/01/2021 1816   HGB 8.8 (L) 12/01/2021 1816   HCT 27.8 (L) 12/01/2021 1816   HCT 24.6 (L) 10/17/2020 1235   PLT 148 (L) 12/01/2021 1816   MCV 85.3 12/01/2021 1816   MCH 27.0 12/01/2021 1816   MCHC 31.7 12/01/2021 1816   RDW 19.1 (H) 12/01/2021 1816   RDW 15.7 (H) 12/23/2013 1132   LYMPHSABS 0.5 (L) 11/26/2021 1147   LYMPHSABS 2.5 12/23/2013 1132   MONOABS 0.9 11/26/2021 1147   EOSABS 0.1 11/26/2021 1147   EOSABS 0.1 12/23/2013 1132   BASOSABS 0.0 11/26/2021 1147   BASOSABS 0.0 12/23/2013 1132    Assessment/Plan:   Progressive CKD stage V now ESRD: Creatinine is back to baseline and BUN improving daily.  However, BUN fairly high.  Unclear what caused BUN elevation to be so high out of proportion to creatinine.  She has not demonstrated signs of uremia thus far although had been confused over the weekend. I suspect this is delirium and not uremia given the BUN has continued to trend down  S/p [facility] placement 2/23 followed by her first HD session.   Second HD finished early this morning. S/p LUE AVG 12/01/21 placement by Dr. Carlis Abbott.  Dose meds for GFR which is now <15 ml/min Monitor Daily I/Os, Daily weight  Maintain MAP>65 for optimal renal perfusion. Avoid nephrotoxic medications including NSAIDs, IV Contrast and phosphate containing bowel preps (FLEETS)  Use synthetic opioids (Fentanyl/Dilaudid) if needed Has outpatient spot at [facility] on TTS schedule when stable for discharge Congestive heart failure: EF around 40 to 29% with diastolic dysfunction.  Lower extremity edema persistent but also may be multifactorial.  Will manage with HD  Left lower Lung nodule - concerning for malignancy and PET scan was recommended.  CT scan of chest showed a  new right middle lobe nodule as well concerning for mets.  Dr. Valeta Harms consulted and will arrange for PET scan before attempt at bronchoscopy and biopsy as an  outpatient. COPD - longtime smoker.  Continue with nebs per primary.  Likely etiology of SOB.   Hyperphosphatemia: Sevelamer  Hypercalemia - not on vitamin D or calcium supplements, likely due to immobility.  Ambulate as tolerated but may also be due to malignancy as above.  Anemia of CKD: Iron deficiency.  IV iron ordered.  Monitor hemoglobin trend.  Hold off on ESA for now given possible lung cancer. 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  Follow-up 3 to 6 months. HTN: Coreg 12.5 mg twice daily.  Blood pressure goal Right lower extremity edema: Predominantly on the right side.  Had PVL of the right leg on 2/15 that was negative for DVT.  Improved. Disposition - per primary svc.  Donetta Potts, MD [employer] [phone]

## 2021-12-02 NOTE — Progress Notes (Signed)
[facility]  Subjective  -no complaints.  States her left hand feels fine after left upper arm AV graft placement.   Objective (!) 115/51 75 97.8 F (36.6 C) 18 (!) 89%  Intake/Output Summary (Last 24 hours) at 12/02/2021 0907 Last data filed at 12/02/2021 0006 Gross per 24 hour  Intake 1040 ml  Output 2020 ml  Net -980 ml    Left upper arm AV graft with palpable thrill Left radial pulse palpable at the wrist Left arm incisions c/d/i  Laboratory Lab Results: Recent Labs    11/30/21 0745 12/01/21 1816  WBC 10.6* 13.4*  HGB 7.4* 8.8*  HCT 24.6* 27.8*  PLT 242 148*   BMET Recent Labs    12/01/21 0752 12/02/21 0602  NA 135 136  K 3.7 4.3  CL 97* 99  CO2 26 24  GLUCOSE 131* 126*  BUN 99* 54*  CREATININE 2.29* 2.01*  CALCIUM 9.6 9.3    COAG Lab Results  Component Value Date   INR 1.0 07/01/2021   INR 1.0 01/05/2021   INR 1.2 01/06/2020   No results found for: PTT  Assessment/Planning:  Postop day 1 status post left upper arm AV graft.  Graft has good thrill this morning with a palpable radial pulse at the wrist.  No signs of steal.  Little ecchymosis along the tunnel line as expected.  Discussed she can use the graft in 1 month from our standpoint.  She will continue to use her tunneled catheter in the interim.  Please call vascular if any questions or concerns.  Marty Heck 12/02/2021 9:07 AM --

## 2021-12-03 LAB — RENAL FUNCTION PANEL
Albumin: 2.7 g/dL — ABNORMAL LOW (ref 3.5–5.0)
Anion gap: 12 (ref 5–15)
BUN: 72 mg/dL — ABNORMAL HIGH (ref 8–23)
CO2: 25 mmol/L (ref 22–32)
Calcium: 9.3 mg/dL (ref 8.9–10.3)
Chloride: 99 mmol/L (ref 98–111)
Creatinine, Ser: 2.78 mg/dL — ABNORMAL HIGH (ref 0.44–1.00)
GFR, Estimated: 18 mL/min — ABNORMAL LOW (ref >60)
Glucose, Bld: 242 mg/dL — ABNORMAL HIGH (ref 70–99)
Phosphorus: 3.6 mg/dL (ref 2.5–4.6)
Potassium: 3.8 mmol/L (ref 3.5–5.1)
Sodium: 136 mmol/L (ref 135–145)

## 2021-12-03 LAB — GLUCOSE, CAPILLARY
Glucose-Capillary: 135 mg/dL — ABNORMAL HIGH (ref 70–99)
Glucose-Capillary: 177 mg/dL — ABNORMAL HIGH (ref 70–99)
Glucose-Capillary: 214 mg/dL — ABNORMAL HIGH (ref 70–99)

## 2021-12-03 MED ORDER — INSULIN GLARGINE-YFGN 100 UNIT/ML ~~LOC~~ SOLN
20.0000 [IU] | Freq: Every day | SUBCUTANEOUS | Status: DC
  Administered 2021-12-03 – 2021-12-08 (×6): 20 [IU] via SUBCUTANEOUS
  Filled 2021-12-03 (×6): qty 0.2

## 2021-12-03 NOTE — Progress Notes (Signed)
Mobility Specialist Progress Note    12/03/21 1051  Mobility  Bed Position Chair  Activity Transferred from bed to chair  Level of Assistance +2 (takes two people)  Games developer wheel walker  Distance Ambulated (ft) 2 ft  Activity Response Tolerated poorly  $Mobility charge 1 Mobility   Pt agreeable. Struggled with knee flexion and wanted to just drag her feet across the floor instead of stepping. Left with call bell in reach and MD and NT present.   [facility] Mobility Specialist  M.S. 5N: [phone]

## 2021-12-03 NOTE — Progress Notes (Signed)
HD#10 SUBJECTIVE:  Patient Summary: [name] [name] is a 71 y.o. with a pertinent PMH of DM2 and CKD stage 5, who presented with LOC and admitted for syncope. Patient found to be uremic. Eventually underwent R TDC and AVG. Started on HD  Overnight Events:  no acute events overnight.    Interm History: Patient seen and evaluated at bedside. Reports she is feeling okay, but stiff. Would like to get up out of bed. She is also hungry. No other complaints.   OBJECTIVE:  Vital Signs: Vitals:   12/02/21 2117 12/03/21 0358 12/03/21 0757 12/03/21 0827  BP: (!) 105/46  (!) 113/55   Pulse: 92 95 81   Resp: 17 17 18    Temp:   98.1 F (36.7 C)   TempSrc:      SpO2: 100% 100% 100% 98%  Weight:  90 kg    Height:       Constitutional: Well-developed, well-nourished,  alert Neck: Normal range of motion.  Cardiovascular: Normal rate, regular rhythm, intact distal pulses. No gallop and no friction rub. No murmur heard. rope-like mass in right antecubital fossa. HD cath in place right upper chest no surrounding erythema or drainage.  Pulmonary: Comfortable with normal work of breathing while not wearing [place].  Abdominal: Soft. +bowel sounds. Non distended. No tenderness, nonperitonitic Musculoskeletal: Normal range of motion. Tender lower extremities, right more edematous than left, nonpitting, BLE edema significantly improved.  Neurological: Awake and alert.  Skin: wound left shin. Bandaged. No drainage.   Pertinent Labs: CBC Latest Ref Rng & Units 12/01/2021 11/30/2021 11/29/2021  WBC 4.0 - 10.5 K/uL 13.4(H) 10.6(H) 12.9(H)  Hemoglobin 12.0 - 15.0 g/dL 8.8(L) 7.4(L) 7.4(L)  Hematocrit 36.0 - 46.0 % 27.8(L) 24.6(L) 23.7(L)  Platelets 150 - 400 K/uL 148(L) 242 214    CMP Latest Ref Rng & Units 12/03/2021 12/02/2021 12/01/2021  Glucose 70 - 99 mg/dL 242(H) 126(H) 131(H)  BUN 8 - 23 mg/dL 72(H) 54(H) 99(H)  Creatinine 0.44 - 1.00 mg/dL 2.78(H) 2.01(H) 2.29(H)  Sodium 135 - 145 mmol/L 136 136  135  Potassium 3.5 - 5.1 mmol/L 3.8 4.3 3.7  Chloride 98 - 111 mmol/L 99 99 97(L)  CO2 22 - 32 mmol/L 25 24 26   Calcium 8.9 - 10.3 mg/dL 9.3 9.3 9.6  Total Protein 6.5 - 8.1 g/dL - - -  Total Bilirubin 0.3 - 1.2 mg/dL - - -  Alkaline Phos 38 - 126 U/L - - -  AST 15 - 41 U/L - - -  ALT 0 - 44 U/L - - -     ASSESSMENT/PLAN:   Assessment:  #Newly Dialysis Dependent ESRD - She is s/p [facility] 2/23 and AVG 2/24. She has had 2 HD sessions thus far, with plans for 3rd HD session 2/28. She has been accepted at [facility] and will be initiated on TTS schedule once discharged. - Avoid nephrotoxic drugs and dose medications per GFR. She has neuropathic pain in her legs and can be given small doses 100mg  Gabapentin as needed for a maximum of 300mg  daily. For more severe pain, dilaudid can be given.  - Continue Sevelamer for her hyperphosphatemia  - Strict I&Os, daily weights, telemetry  - patient is stable medically, however, she has severe deconditioning and requires significant support to transition from bed to chair. She will likely require SNF vs HH PT on discharge.    #Acute Hypoxic Respiratory Failure 2/2 pulmonary edema and Possible COPD exacerbation  - resolved Patient had  been reporting shortness of breath and was found to have pulmonary edema on CXR. She improved with IV lasix and duonebs and steroids.  - She  uses Symbicort at home and was started on Breo, duonebs, and prednisone for suspected COPD exacerbation. She has completed her steroid course. Breathing status has improved since initiation of breathing treatments as well. She is breathing comfortably and satting 100% on RA.  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 Initially noted on CT scan 2020. Due to its enlarging nature, nodule was felt to be malignant and further  further evaluated with dedicated chest CT which demonstrated the LLL nodule had greatly enlarged from 2020 concerning for primary lung malignancy.  Additionally, a new right middle lobe nodule seen on imaging, possible post-infectious/inflammatory was also observed and will require follow up to exclude metastasis.  - PCCM has been consulted. No plans for Bx this admission. She has been scheduled for outpatient PET scan and follow up in their office.    #Acute on Chronic HFmrEF Patient's echo 02/2021.  EF noted to be 45 to 50% at that time.  There was no wall motion abnormalities.  She was noted to have left ventricular hypertrophy, no valvulopathy, left atrium was noted to be severely dilated. ECHO shows mild worsening of EF to 40-45% in setting of worsening uremia.  Patient is on torsemide milligrams twice daily per EMR as well as metolazone 3 times a week. Her Home diuretics are being held for now. Volume status continues to improve with HD.    #R Ulnar vein DVT #R cephalic vein thrombosis  #? Superficial cellulitis-resolved  - vascular US obtained showing superficial thrombosis of R cephalic as well as DVT in R ulnar vein. Superficial thrombus able to be managed with warm compresses. More distal DVT has low probability of embolizing and can be observed at this time.  -Cannot exclude possibility of cellulitis given leukocytosis, though she has been afebrile which is more suggestive of reactive leukocytosis. Blood Cx remain negative and patient completed a 5 d course of augmentin   #Chronic Pain, Chronic use of opioids - has not been receiving her home oxycodone here. On review of PDMP, she receives #120 tablets of percocet 10-325 a month and fills regularly. With her ESRD, we have added oral dilaudid PRN rather than resuming her home oxy.  - Oral dilaudid PRN    #Constipation 2x stools this AM. Continue daily bowel regimen.     #T2DM Patient's last A1c was 02/2021 and noted to be 6.8.  Per  EMR she takes 75/25 insulin 40 units in the a.m. and 30 units in the evening as well as Trulicity 1.5 mg weekly.  Patient is also on Reglan with 3 times daily dosing. This hospitalization she has required Semglee 15u qd + 5u aspart WC and SSI.  -Will continue this current regimen  #Renal mass  Patient noted to have interval enlargement of L kidney exophytic density (now 3.2 cm) compared to 02/2021 imaging - patient underwent MRI imaging of this mass. 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    # Anemia of CKD/iron deficiency - patient has a history of hospitalization for upper GI bleed and she was noted  to have an AVM on pill endoscopy. Patient was noted to be significantly iron deficient on labs 09/2021 with a ferritin of 11 and a transferrin saturation of 7%.  She did receive IV iron in the past for this most recently, Feraheme 10/04/2021. - she has been started on IV iron  Delene Ruffini, PGY1 Internal Medicine  Pager [phone]  9:34 AM, 12/03/2021   Please contact the on call pager after 5 pm and on weekends at [phone].

## 2021-12-03 NOTE — Progress Notes (Signed)
Patient ID: [name] [name], female   DOB: 14-Jun-1951, 71 y.o.   MRN: [id number] S: Feels "weak" and needed assistance to get from bed to chair. O:BP (!) 126/93 (BP Location: Right Leg)    Pulse 94    Temp 98.2 F (36.8 C) (Oral)    Resp 17    Ht 5\' 3"  (1.6 m)    Wt 90 kg    SpO2 100%    BMI 35.15 kg/m  No intake or output data in the 24 hours ending 12/03/21 1243 Intake/Output: I/O last 3 completed shifts: In: -  Out: 2000 [Other:2000]  Intake/Output this shift:  No intake/output data recorded. Weight change: 0.4 kg Gen:NAD CVS: RRR Resp: occ rhonchi bilaterally Abd:+Bs, soft, NT/ND Ext: 1+ edema of RLE, LUE AVG +T/B  Recent Labs  Lab 11/27/21 0148 11/28/21 0311 11/29/21 0313 11/30/21 0745 12/01/21 0752 12/02/21 0602 12/03/21 0045  NA 138 138 134* 137 135 136 136  K 3.8 3.8 4.3 3.9 3.7 4.3 3.8  CL 98 97* 95* 97* 97* 99 99  CO2 27 30 24 28 26 24 25   GLUCOSE 191* 146* 244* 136* 131* 126* 242*  BUN 146* 147* 163* 164* 99* 54* 72*  CREATININE 2.75* 2.85* 3.49* 3.07* 2.29* 2.01* 2.78*  ALBUMIN 3.7 3.1* 3.0* 3.1* 3.0* 3.1* 2.7*  CALCIUM 10.9* 10.7* 10.0 10.1 9.6 9.3 9.3  PHOS  --  4.8* 3.9 4.1 3.9 3.7 3.6  AST 18  --   --   --   --   --   --   ALT 13  --   --   --   --   --   --    Liver Function Tests: Recent Labs  Lab 11/27/21 0148 11/28/21 0311 12/01/21 0752 12/02/21 0602 12/03/21 0045  AST 18  --   --   --   --   ALT 13  --   --   --   --   ALKPHOS 50  --   --   --   --   BILITOT 0.8  --   --   --   --   PROT 6.7  --   --   --   --   ALBUMIN 3.7   < > 3.0* 3.1* 2.7*   < > = values in this interval not displayed.   No results for input(s): LIPASE, AMYLASE in the last 168 hours. No results for input(s): AMMONIA in the last 168 hours. CBC: Recent Labs  Lab 11/27/21 0148 11/28/21 0311 11/29/21 0313 11/30/21 0745 12/01/21 1816  WBC 19.8* 12.5* 12.9* 10.6* 13.4*  HGB 8.9* 8.3* 7.4* 7.4* 8.8*  HCT 28.9* 27.5* 23.7* 24.6* 27.8*  MCV 87.3 89.6 86.5 86.0 85.3   PLT 263 228 214 242 148*   Cardiac Enzymes: No results for input(s): CKTOTAL, CKMB, CKMBINDEX, TROPONINI in the last 168 hours. CBG: Recent Labs  Lab 12/02/21 1117 12/02/21 1640 12/02/21 2127 12/03/21 0756 12/03/21 1144  GLUCAP 158* 136* 214* 135* 177*    Iron Studies: No results for input(s): IRON, TIBC, TRANSFERRIN, FERRITIN in the last 72 hours. Studies/Results: No results found.  sodium chloride   Intravenous Once   carvedilol  12.5 mg Oral BID   Chlorhexidine Gluconate Cloth  6 each Topical Q0600   feeding supplement  237 mL Oral TID BM   fluticasone furoate-vilanterol  1 puff Inhalation Daily   gabapentin  100 mg Oral TID   heparin  5,000 Units Subcutaneous Q8H  hydrALAZINE  50 mg Oral Q8H   insulin aspart  0-9 Units Subcutaneous TID WC   insulin aspart  5 Units Subcutaneous TID WC   insulin glargine-yfgn  20 Units Subcutaneous Daily   levothyroxine  200 mcg Oral Q0600   metoCLOPramide  5 mg Oral TID   multivitamin  1 tablet Oral QHS   pantoprazole  40 mg Oral QHS   senna  2 tablet Oral QHS   sevelamer carbonate  800 mg Oral TID WC   traZODone  50 mg Oral QHS    BMET    Component Value Date/Time   NA 136 12/03/2021 0045   NA 146 (H) 12/23/2013 1132   K 3.8 12/03/2021 0045   CL 99 12/03/2021 0045   CO2 25 12/03/2021 0045   GLUCOSE 242 (H) 12/03/2021 0045   BUN 72 (H) 12/03/2021 0045   BUN 31 (H) 12/23/2013 1132   CREATININE 2.78 (H) 12/03/2021 0045   CREATININE 3.51 (HH) 10/17/2020 1234   CREATININE 1.32 (H) 06/29/2014 1100   CALCIUM 9.3 12/03/2021 0045   CALCIUM 9.1 09/07/2021 1036   GFRNONAA 18 (L) 12/03/2021 0045   GFRNONAA 13 (L) 10/17/2020 1234   GFRAA 15 (L) 04/22/2020 1129   CBC    Component Value Date/Time   WBC 13.4 (H) 12/01/2021 1816   RBC 3.26 (L) 12/01/2021 1816   HGB 8.8 (L) 12/01/2021 1816   HCT 27.8 (L) 12/01/2021 1816   HCT 24.6 (L) 10/17/2020 1235   PLT 148 (L) 12/01/2021 1816   MCV 85.3 12/01/2021 1816   MCH 27.0  12/01/2021 1816   MCHC 31.7 12/01/2021 1816   RDW 19.1 (H) 12/01/2021 1816   RDW 15.7 (H) 12/23/2013 1132   LYMPHSABS 0.5 (L) 11/26/2021 1147   LYMPHSABS 2.5 12/23/2013 1132   MONOABS 0.9 11/26/2021 1147   EOSABS 0.1 11/26/2021 1147   EOSABS 0.1 12/23/2013 1132   BASOSABS 0.0 11/26/2021 1147   BASOSABS 0.0 12/23/2013 1132    Assessment/Plan:   Progressive CKD stage V now ESRD: Creatinine is back to baseline and BUN improving daily.  However, BUN fairly high.  Unclear what caused BUN elevation to be so high out of proportion to creatinine.  She has not demonstrated signs of uremia thus far although had been confused over the weekend. I suspect this is delirium and not uremia given the BUN has continued to trend down  S/p [facility] placement 2/23 followed by her first HD session.   Second HD finished early 12/02/21. For 3rd HD session on 12/05/21 S/p LUE AVG 12/01/21 placement by Dr. Carlis Abbott.  Dose meds for GFR which is now <15 ml/min Monitor Daily I/Os, Daily weight  Maintain MAP>65 for optimal renal perfusion. Avoid nephrotoxic medications including NSAIDs, IV Contrast and phosphate containing bowel preps (FLEETS)  Use synthetic opioids (Fentanyl/Dilaudid) if needed Has outpatient spot at [facility] on TTS schedule when stable for discharge Congestive heart failure: EF around 40 to 27% with diastolic dysfunction.  Lower extremity edema persistent but also may be multifactorial.  Will manage with HD  Left lower Lung nodule - concerning for malignancy and PET scan was recommended.  CT scan of chest showed a new right middle lobe nodule as well concerning for mets.  Dr. Valeta Harms consulted and will arrange for PET scan before attempt at bronchoscopy and biopsy as an outpatient. COPD - longtime smoker.  Continue with nebs per primary.  Likely etiology of SOB.   Hyperphosphatemia: Sevelamer  Hypercalemia - not on vitamin D  or calcium supplements, likely due to immobility.  Ambulate as tolerated but may  also be due to malignancy as above.  Anemia of CKD: Iron deficiency.  IV iron ordered.  Monitor hemoglobin trend.  Hold off on ESA for now given possible lung cancer. 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  Follow-up 3 to 6 months. HTN: Coreg 12.5 mg twice daily.  Blood pressure goal Right lower extremity edema: Predominantly on the right side.  Had PVL of the right leg on 2/15 that was negative for DVT.  Improved. Disposition - per primary svc but due to deconditioning will likely require SNF or rehab.  Donetta Potts, MD [employer] [phone]

## 2021-12-04 ENCOUNTER — Encounter (HOSPITAL_COMMUNITY): Payer: Self-pay | Admitting: Surgery

## 2021-12-04 LAB — RENAL FUNCTION PANEL
Albumin: 2.8 g/dL — ABNORMAL LOW (ref 3.5–5.0)
Anion gap: 10 (ref 5–15)
BUN: 90 mg/dL — ABNORMAL HIGH (ref 8–23)
CO2: 26 mmol/L (ref 22–32)
Calcium: 9.2 mg/dL (ref 8.9–10.3)
Chloride: 97 mmol/L — ABNORMAL LOW (ref 98–111)
Creatinine, Ser: 3.45 mg/dL — ABNORMAL HIGH (ref 0.44–1.00)
GFR, Estimated: 14 mL/min — ABNORMAL LOW (ref >60)
Glucose, Bld: 226 mg/dL — ABNORMAL HIGH (ref 70–99)
Phosphorus: 3.9 mg/dL (ref 2.5–4.6)
Potassium: 4.1 mmol/L (ref 3.5–5.1)
Sodium: 133 mmol/L — ABNORMAL LOW (ref 135–145)

## 2021-12-04 LAB — GLUCOSE, CAPILLARY
Glucose-Capillary: 248 mg/dL — ABNORMAL HIGH (ref 70–99)
Glucose-Capillary: 190 mg/dL — ABNORMAL HIGH (ref 70–99)
Glucose-Capillary: 202 mg/dL — ABNORMAL HIGH (ref 70–99)
Glucose-Capillary: 237 mg/dL — ABNORMAL HIGH (ref 70–99)

## 2021-12-04 MED ORDER — KIDNEY FAILURE BOOK: Freq: Once | Status: AC

## 2021-12-04 MED ORDER — AMLODIPINE BESYLATE 5 MG PO TABS
5.0000 mg | ORAL_TABLET | Freq: Every day | ORAL | Status: DC
  Administered 2021-12-04 – 2021-12-05 (×2): 5 mg via ORAL
  Filled 2021-12-04 (×4): qty 1

## 2021-12-04 NOTE — Progress Notes (Signed)
Rounded on patient today in correlation to transition to outpatient HD. Patient found sleeping in her recliner. Patient awakened and agreeable to conversation. Patient's sister called and listened in as well. Ordered Kidney Failure book. Patient educated at the bedside regarding care of tunneled dialysis catheter, AV fistula care, assessment of thrill daily and proper medication administration on HD days.  Patient also educated on the importance of adhering to scheduled dialysis treatments, the effects of fluid overload, hyperkalemia and hyperphosphatemia. Patient capable of re-verbalizing via teach back method. Also educated patient on services available through the interdisciplinary team in the clinic setting  Patient with no further questions at this time. Handouts and contact information provided to patient for any further assistance. Will follow as appropriate.   Dorthey Sawyer, RN  Dialysis Nurse Coordinator Phone: [phone]

## 2021-12-04 NOTE — Progress Notes (Signed)
Occupational Therapy Treatment Patient Details Name: [name] [name] MRN: [id number] DOB: 06/12/1951 Today's Date: 12/04/2021   History of present illness Pt is a 71 year old woman who came to the ED on 11/21/21 after syncopal after straining on toilet (thought to be vasovagal), abdominal pain, vomiting and constipation. Pulmonary and renal imaging concerning for malignancy.Pt started on HD this admission. PMH: COPD, CKD5, CHF with EF of 40-45%, OSA, gout, iron deficiency, low back pain, LE venous insufficiency with L LE wound, HTN, TIA, DM2.   OT comments  Pt with significant weakness, requiring +2 max assist to stand from low and taller surfaces. She needs verbal cues and 2 person assist to turn 180 degrees to transfer chair<>BSC with increased time.  Moderate assistance needed to change soiled gown, total assist for pericare. Pt continues to be adamant that she is going home with her sister when told therapy was changing discharge recommendation. Called sister, [name], and left message regarding the amount of physical assist pt needs and updated recommendation for post acute rehab. Added hoyer lift to equipment list should family choose to take pt home. May also benefit from hospital bed at home.    Recommendations for follow up therapy are one component of a multi-disciplinary discharge planning process, led by the attending physician.  Recommendations may be updated based on patient status, additional functional criteria and insurance authorization.    Follow Up Recommendations  Acute inpatient rehab (3hours/day)    Assistance Recommended at Discharge Frequent or constant Supervision/Assistance  Patient can return home with the following  Two people to help with walking and/or transfers;Two people to help with bathing/dressing/bathroom;Direct supervision/assist for medications management;Assistance with cooking/housework;Assist for transportation;Help with stairs or ramp for entrance;Direct  supervision/assist for financial management   Equipment Recommendations  Other (comment) (hoyer lift)    Recommendations for Other Services      Precautions / Restrictions Precautions Precautions: Fall Precaution Comments: partial stand pivots at baseline, does better without RW       Mobility Bed Mobility               General bed mobility comments: pt received and returned to chair    Transfers Overall transfer level: Needs assistance Equipment used: 2 person hand held assist Transfers: Sit to/from Stand, Bed to chair/wheelchair/BSC Sit to Stand: +2 physical assistance, Max assist     Step pivot transfers: +2 physical assistance, Min assist     General transfer comment: Pt requiring +2 max assist from low recliner and higher BSC to come to stand with trunk flexed, increased time with verbal cues and min assist to turn 180 degrees.     Balance Overall balance assessment: Needs assistance   Sitting balance-Leahy Scale: Fair       Standing balance-Leahy Scale: Poor Standing balance comment: reliant on B UE and external support, does not stand upright                           ADL either performed or assessed with clinical judgement   ADL                   Upper Body Dressing : Moderate assistance;Sitting       Toilet Transfer: +2 for physical assistance;Maximal assistance;Stand-pivot;BSC/3in1 Armed forces technical officer Details (indicate cue type and reason): +2 max assist to stand, +2 min to pivot with verbal cues for technique Toileting- Clothing Manipulation and Hygiene: Total assistance;Bed level Toileting - Clothing Manipulation Details (  indicate cue type and reason): rolled in flattened recliner            Extremity/Trunk Assessment Upper Extremity Assessment Upper Extremity Assessment: LUE deficits/detail LUE Deficits / Details: painful at HD graft site            Vision       Perception     Praxis      Cognition  Arousal/Alertness: Awake/alert Behavior During Therapy: WFL for tasks assessed/performed Overall Cognitive Status: Impaired/Different from baseline Area of Impairment: Memory, Following commands, Safety/judgement, Problem solving                     Memory: Decreased short-term memory Following Commands: Follows one step commands with increased time, Follows one step commands consistently Safety/Judgement: Decreased awareness of deficits, Decreased awareness of safety   Problem Solving: Slow processing, Decreased initiation, Difficulty sequencing, Requires verbal cues, Requires tactile cues General Comments: Pt continues to be adamant she will go home with her sister despite requiring +2 max assist to stand.        Exercises      Shoulder Instructions       General Comments      Pertinent Vitals/ Pain       Pain Assessment Pain Assessment: Faces Faces Pain Scale: Hurts even more Pain Location: L UE at HD fistula, back Pain Descriptors / Indicators: Sore, Discomfort, Grimacing, Guarding Pain Intervention(s): Monitored during session, Repositioned, Patient requesting pain meds-RN notified  Home Living                                          Prior Functioning/Environment              Frequency  Min 2X/week        Progress Toward Goals  OT Goals(current goals can now be found in the care plan section)  Progress towards OT goals: Not progressing toward goals - comment (pt with increased weakness)  Acute Rehab OT Goals OT Goal Formulation: With patient Time For Goal Achievement: 12/07/21 Potential to Achieve Goals: [place]  Plan Discharge plan needs to be updated    Co-evaluation    PT/OT/SLP Co-Evaluation/Treatment: Yes Reason for Co-Treatment: For patient/therapist safety          AM-PAC OT "6 Clicks" Daily Activity     Outcome Measure   Help from another person eating meals?: A Little Help from another person taking care of  personal grooming?: A Little Help from another person toileting, which includes using toliet, bedpan, or urinal?: Total Help from another person bathing (including washing, rinsing, drying)?: A Lot Help from another person to put on and taking off regular upper body clothing?: A Lot Help from another person to put on and taking off regular lower body clothing?: Total 6 Click Score: 12    End of Session    OT Visit Diagnosis: Unsteadiness on feet (R26.81);Other abnormalities of gait and mobility (R26.89);Muscle weakness (generalized) (M62.81);Pain   Activity Tolerance Patient tolerated treatment well   Patient Left in chair;with call bell/phone within reach;with chair alarm set   Nurse Communication Mobility status;Patient requests pain meds        Time: 1108-1150 OT Time Calculation (min): 42 min  Charges: OT General Charges $OT Visit: 1 Visit OT Treatments $Self Care/Home Management : 8-22 mins  Nestor Lewandowsky, OTR/L [place] Pager: [phone] Office: [phone]  [name] 12/04/2021, 1:15 PM

## 2021-12-04 NOTE — Progress Notes (Signed)
HD#11 SUBJECTIVE:  Patient Summary: [name] [name] is a 71 y.o. with a pertinent PMH of DM2 and CKD stage 5, who presented with LOC and admitted for syncope. Patient found to be uremic. Eventually underwent R TDC and AVG. Started on HD  Overnight Events:  no acute events overnight.    Interm History: Patient seen and evaluated. Sitting up in chair. Discussed with sister via phone call while in room. Concern about arm s/p AVG and multivitamin.   OBJECTIVE:  Vital Signs: Vitals:   12/04/21 0741 12/04/21 0919 12/04/21 1201 12/04/21 1634  BP: 127/61   (!) 144/96  Pulse: (!) 106   (!) 102  Resp: 16   16  Temp: 99.3 F (37.4 C)   98.1 F (36.7 C)  TempSrc: Oral     SpO2: 100% 96% 98% 98%  Weight:      Height:       Constitutional: Well-developed, well-nourished,  alert Neck: Normal range of motion.  Cardiovascular: Normal rate, regular rhythm, intact distal pulses. No gallop and no friction rub. No murmur heard. rope-like mass in right antecubital fossa. HD cath in place right upper chest no surrounding erythema or drainage. Left antecubital AVG with palpable thrill. Mild erythema.  Pulmonary: Comfortable with normal work of breathing while not wearing [place].  Abdominal: Soft. +bowel sounds. Non distended. No tenderness, nonperitonitic Musculoskeletal: Normal range of motion. Tender lower extremities, right more edematous than left, nonpitting, BLE edema significantly improved.  Neurological: Awake and alert.  Skin: wound left shin. Bandaged. No drainage.   Pertinent Labs: CBC Latest Ref Rng & Units 12/01/2021 11/30/2021 11/29/2021  WBC 4.0 - 10.5 K/uL 13.4(H) 10.6(H) 12.9(H)  Hemoglobin 12.0 - 15.0 g/dL 8.8(L) 7.4(L) 7.4(L)  Hematocrit 36.0 - 46.0 % 27.8(L) 24.6(L) 23.7(L)  Platelets 150 - 400 K/uL 148(L) 242 214    CMP Latest Ref Rng & Units 12/04/2021 12/03/2021 12/02/2021  Glucose 70 - 99 mg/dL 226(H) 242(H) 126(H)  BUN 8 - 23 mg/dL 90(H) 72(H) 54(H)  Creatinine 0.44 - 1.00  mg/dL 3.45(H) 2.78(H) 2.01(H)  Sodium 135 - 145 mmol/L 133(L) 136 136  Potassium 3.5 - 5.1 mmol/L 4.1 3.8 4.3  Chloride 98 - 111 mmol/L 97(L) 99 99  CO2 22 - 32 mmol/L 26 25 24   Calcium 8.9 - 10.3 mg/dL 9.2 9.3 9.3  Total Protein 6.5 - 8.1 g/dL - - -  Total Bilirubin 0.3 - 1.2 mg/dL - - -  Alkaline Phos 38 - 126 U/L - - -  AST 15 - 41 U/L - - -  ALT 0 - 44 U/L - - -     ASSESSMENT/PLAN:   Assessment:  #Newly Dialysis Dependent ESRD - She is s/p R [facility] 2/23 and AVG 2/24. She has had 2 HD sessions thus far, with plans for 3rd HD session 2/28. She has been accepted at [facility] and will be initiated on TTS schedule once discharged. - Avoid nephrotoxic drugs and dose medications per GFR. She has neuropathic pain in her legs and can be given small doses 100mg  Gabapentin as needed for a maximum of 300mg  daily. For more severe pain, dilaudid can be given.  - Continue Sevelamer for her hyperphosphatemia  - Strict I&Os, daily weights, telemetry  - patient is stable medically, however, she has severe deconditioning and requires significant support to transition from bed to chair. She will likely require SNF vs HH PT on discharge.   Deconditioning - patient is severely deconditioned and requires 2 people  to assist her with transitioning from bed to her chair. Patient had initially wanted to discharge home with assistance from her sister, however, given assistance requirements here, do not believe she would be successful at home upon discharge. Patient would require much assistance with transferring to HD sessions 3 times weekly. She has been evaluated by PT with most recent recommendation for CIR, however, patient was evaluated and not thought to be good candidate. After Discussions with patient and her family, have decided to pursue SNF upon discharge.   #Acute Hypoxic Respiratory Failure 2/2 pulmonary edema and Possible COPD exacerbation  - resolved Patient had been reporting shortness of  breath and was found to have pulmonary edema on CXR. She improved with IV lasix and duonebs and steroids.  - She  uses Symbicort at home and was started on Breo, duonebs, and prednisone for suspected COPD exacerbation. She has completed her steroid course. Breathing status has improved since initiation of breathing treatments as well. She is breathing comfortably and satting 100% on RA.  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 Initially noted on CT scan 2020. Due to its enlarging nature, nodule was felt to be malignant and further further evaluated with dedicated chest CT which demonstrated the LLL nodule had greatly enlarged from 2020 concerning for primary lung malignancy.  Additionally, a new right middle lobe nodule seen on imaging, possible post-infectious/inflammatory was also observed and will require follow up to exclude metastasis.  - PCCM has been consulted. No plans for Bx this admission. She has been scheduled for outpatient PET scan and follow up in their office.    #Acute on Chronic HFmrEF Patient's echo 02/2021.  EF noted to be 45 to 50% at that time.  There was no wall motion abnormalities.  She was noted to have left ventricular hypertrophy, no valvulopathy, left atrium was noted to be severely dilated. ECHO shows mild worsening of EF to 40-45% in setting of worsening uremia.  Patient is on torsemide milligrams twice daily per EMR as well as metolazone 3 times a week. Her Home diuretics are being held for now. Volume status continues to improve with HD.    #R Ulnar vein DVT #R cephalic vein thrombosis  #? Superficial cellulitis-resolved  - vascular US obtained showing superficial thrombosis of R cephalic as well as DVT in R ulnar vein. Superficial thrombus able to be managed with warm compresses. More distal DVT has low  probability of embolizing and can be observed at this time.  -Cannot exclude possibility of cellulitis given leukocytosis, though she has been afebrile which is more suggestive of reactive leukocytosis. Blood Cx remain negative and patient completed a 5 d course of augmentin   #Chronic Pain, Chronic use of opioids - has not been receiving her home oxycodone here. On review of PDMP, she receives #120 tablets of percocet 10-325 a month and fills regularly. With her ESRD, we have added oral dilaudid PRN rather than resuming her home oxy.  - Oral dilaudid PRN    #Constipation 2x stools this AM. Continue daily bowel regimen.     #T2DM Patient's last A1c was 02/2021 and noted to be 6.8.  Per EMR she takes 75/25 insulin 40 units in the a.m. and 30 units in the evening as well as Trulicity 1.5 mg weekly.  Patient is also on Reglan  with 3 times daily dosing. This hospitalization she has required Semglee 15u qd + 5u aspart WC and SSI.  -Will continue this current regimen  #Renal mass  Patient noted to have interval enlargement of L kidney exophytic density (now 3.2 cm) compared to 02/2021 imaging - patient underwent MRI imaging of this mass. 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 Patient was noted to be significantly iron deficient on labs 09/2021 with a ferritin of 11 and a transferrin saturation of 7%.  She did receive IV iron in the past for this most recently, Feraheme 10/04/2021. - she has been started on IV iron  Delene Ruffini, PGY1 Internal Medicine  Pager [phone]  6:40 PM, 12/04/2021  Please contact the on call pager after 5 pm and on weekends at [phone].

## 2021-12-04 NOTE — Progress Notes (Signed)
Daily Progress Note   Patient Name: [name] [name]       Date: 12/04/2021 DOB: [birth year]  Age: 71 y.o. MRN#: [id number] Attending Physician: Velna Ochs, MD Primary Care Physician: [name], MD Admit Date: 11/21/2021  Reason for Consultation/Follow-up: Establishing goals of care  Patient Profile/HPI:  71 y.o. female  with past medical history of COPD, CKD stage V, diastolic CHF (EF 40 to 99%), OSA, gout, iron deficient anemia, low back pain, lower extremity venous insufficiency with left lower extremity wound, HTN, TIA, and type 2 diabetes admitted on 11/21/2021 with syncope after straining on the toilet with abdominal pain, vomiting, and constipation.  Etiology of syncopal episode remains unclear.     CT of abdomen reveals exophytic soft tissue on the left kidney.  CT of the chest reveals left lower lobe nodule (2.4 cm).  Imaging suspect for malignancy.   Subjective: Chart reviewed including progress notes, labs, imaging.  [name] [name] is sitting up awake and alert. No one is at bedside.  Note plans for discharge today after dialysis. [name] [name] tells me [name] has completed PACE application but may take up to 30 days for approval.  She complains of back and leg pain, is sitting up in chair. Tells me she has just received a pain pill. She is much less anxious and depressed appearing today.   Physical Exam Vitals and nursing note reviewed.  Cardiovascular:     Rate and Rhythm: Normal rate.  Pulmonary:     Effort: Pulmonary effort is normal.  Neurological:     Mental Status: She is alert.            Vital Signs: BP 127/61 (BP Location: Right Leg)    Pulse (!) 106    Temp 99.3 F (37.4 C) (Oral)    Resp 16    Ht 5\' 3"  (1.6 m)    Wt 90 kg    SpO2 98%    BMI 35.15 kg/m  SpO2: SpO2:  98 % O2 Device: O2 Device: Room Air O2 Flow Rate: O2 Flow Rate (L/min): 2 L/min  Intake/output summary: No intake or output data in the 24 hours ending 12/04/21 1227  LBM: Last BM Date : 12/02/21 Baseline Weight: Weight: 93.3 kg Most recent weight: Weight: 90 kg       Palliative Assessment/Data: PPS: 50%  Patient Active Problem List   Diagnosis Date Noted   Malnutrition of moderate degree 12/01/2021   ESRD on dialysis [facility])    Lung nodules    Advanced care planning/counseling discussion    Goals of care, counseling/discussion    Acute respiratory failure with hypoxia ([place])    Labored breathing    Kidney lesion, native, left 11/24/2021   CKD (chronic kidney disease) stage 5, GFR less than 15 ml/min (HCC) 11/23/2021   Syncope 11/21/2021   Chronic diastolic (congestive) heart failure ([place]) 07/01/2021   Chronic pain syndrome 07/01/2021   COPD exacerbation ([place]) 07/01/2021   Cellulitis of leg, right 07/01/2021   SARS-CoV-2 positive 02/21/2021   Increased weakness when ambulating 02/17/2021   Intractable nausea and vomiting 02/15/2021   Symptomatic anemia 01/24/2021   Anemia, unspecified 01/08/2020   Bacteremia 01/07/2020   UTI (urinary tract infection) 01/06/2020   Anemia 10/26/2019   S/P lumbar fusion 09/18/2019   Chronic CHF (congestive heart failure) ([place]) 03/16/2019   Leukocytosis 03/16/2019   Chest pain 11/27/2018   Pre-syncope 11/27/2018   Epigastric abdominal pain 11/27/2018   Solitary pulmonary nodule 11/06/2018   Hypertensive heart and renal disease, stage 1-4 or unspecified chronic kidney disease, with heart failure ([place]) 10/20/2018   CHF (congestive heart failure), NYHA class III, acute on chronic, systolic ([place]) 78/29/5621   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, unspecified extremity ([place]) 04/19/2016   Cellulitis of abdominal wall 03/21/2015   Acute on chronic renal failure ([place]) 03/21/2015   DM (diabetes mellitus), type 2  with renal complications ([place]) 30/86/5784   Abdominal wall abscess 03/21/2015   Failed total hip arthroplasty ([place]) 08/11/2014   Low urine output 08/11/2014   Acute on chronic systolic CHF (congestive heart failure), NYHA class 3 ([place]) 08/11/2014   Asthma, chronic 08/11/2014   Hypothyroidism 08/11/2014   OSA (obstructive sleep apnea) 08/11/2014   Positive cardiac stress test 06/29/2014   Hyperlipidemia 02/12/2014   Left arm weakness 12/23/2013   TIA (transient ischemic attack) 12/23/2013   Left buttock abscess 10/12/2013   Cellulitis and abscess of leg, right 04/16/2013   Acute blood loss anemia 02/14/2013   HTN (hypertension) 02/14/2013   Cellulitis and abscess of buttock 02/09/2013   Class 2 obesity due to excess calories with body mass index (BMI) of 36.0 to 36.9 in adult 02/03/2013   Diarrhea 02/03/2013   Hyponatremia 02/03/2013   Sepsis due to undetermined organism ([place]) 02/02/2013   Diabetes mellitus without complication ([place]) 69/62/9528   CKD (chronic kidney disease), stage IV ([place]) 02/02/2013   Cellulitis 02/02/2013   PAD (peripheral artery disease) ([place]) 04/26/2011   Tobacco abuse 04/26/2011    Palliative Care Assessment & Plan    Assessment/Recommendations/Plan  CKD5 in the setting of CHF and COPD- GOC are for continued life prolonging interventions- she is hoping to be discharged soon and be able to participate in activities with PACE, has started HD Goals are clear and patient is nearing discharge- Palliative will chart check- please call if further assistance is needed.    Code Status: DNR  Prognosis:  Unable to determine  Discharge Planning: Home with [place] was discussed with patient and family.   Thank you for allowing the Palliative Medicine Team to assist in the care of this patient.   Mariana Kaufman, AGNP-C Palliative Medicine   Please contact Palliative Medicine Team phone at [phone] for questions and concerns.

## 2021-12-04 NOTE — Progress Notes (Signed)
Physical Therapy Treatment Patient Details Name: [name] [name] MRN: [id number] DOB: 10/16/1950 Today's Date: 12/04/2021   History of Present Illness Pt is a 71 year old woman who came to the ED on 11/21/21 after syncopal after straining on toilet (thought to be vasovagal), abdominal pain, vomiting and constipation. Pulmonary and renal imaging concerning for malignancy.Pt started on HD this admission. PMH: COPD, CKD5, CHF with EF of 40-45%, OSA, gout, iron deficiency, low back pain, LE venous insufficiency with L LE wound, HTN, TIA, DM2.    PT Comments    Pt received in recliner and agreeable to session, requiring max assist +2 throughout to stand from low and taller surfaces. 2 person assist and verbal cues needed for pt to turn 180 degrees once standing to transfer recline <> BSC. When discharge discussion initiated, pt continues to be adamant she is going home to sisters. Due to amount of physical assist pt currently requires it would be unsafe for pt to d/c home, recommend acute inpatient rehabilitation at this time to progress mobility before d/c home. Discussed with supervising PT. Pt continues to benefit from skilled PT services to progress toward functional mobility goals.     Recommendations for follow up therapy are one component of a multi-disciplinary discharge planning process, led by the attending physician.  Recommendations may be updated based on patient status, additional functional criteria and insurance authorization.  Follow Up Recommendations  Acute inpatient rehab (3hours/day)     Assistance Recommended at Discharge Frequent or constant Supervision/Assistance  Patient can return home with the following Assistance with cooking/housework;Assist for transportation;Help with stairs or ramp for entrance;A lot of help with walking and/or transfers;A lot of help with bathing/dressing/bathroom   Equipment Recommendations  None recommended by PT    Recommendations for Other  Services       Precautions / Restrictions Precautions Precautions: Fall Precaution Comments: partial stand pivots at baseline, does better without RW     Mobility  Bed Mobility               General bed mobility comments: pt received and returned to chair    Transfers Overall transfer level: Needs assistance Equipment used: 2 person hand held assist Transfers: Sit to/from Stand, Bed to chair/wheelchair/BSC Sit to Stand: +2 physical assistance, Max assist   Step pivot transfers: +2 physical assistance, Min assist       General transfer comment: Pt requiring +2 max assist from low recliner and higher BSC to come to stand with trunk flexed, increased time with verbal cues and min assist to turn 180 degrees.    Ambulation/Gait                   Stairs             Wheelchair Mobility    Modified Rankin (Stroke Patients Only)       Balance Overall balance assessment: Needs assistance   Sitting balance-Leahy Scale: Fair       Standing balance-Leahy Scale: Poor Standing balance comment: reliant on B UE and external support, does not stand upright                            Cognition Arousal/Alertness: Awake/alert Behavior During Therapy: WFL for tasks assessed/performed Overall Cognitive Status: Impaired/Different from baseline Area of Impairment: Memory, Following commands, Safety/judgement, Problem solving  Memory: Decreased short-term memory Following Commands: Follows one step commands with increased time, Follows one step commands consistently Safety/Judgement: Decreased awareness of deficits, Decreased awareness of safety   Problem Solving: Slow processing, Decreased initiation, Difficulty sequencing, Requires verbal cues, Requires tactile cues General Comments: Pt continues to be adamant she will go home with her sister despite requiring +2 max assist to stand.        Exercises General  Exercises - Lower Extremity Gluteal Sets: AROM, Both, 10 reps, Seated Long Arc Quad: AROM, Both, 5 reps, Seated Hip Flexion/Marching: AROM, Both, 10 reps, Seated Toe Raises: AROM, Both, 10 reps, Seated Heel Raises: AROM, Both, 10 reps, Seated    General Comments        Pertinent Vitals/Pain Pain Assessment Pain Assessment: Faces Faces Pain Scale: Hurts even more Pain Location: L UE at HD fistula, back Pain Descriptors / Indicators: Sore, Discomfort, Grimacing, Guarding Pain Intervention(s): Limited activity within patient's tolerance, Monitored during session, Repositioned, Patient requesting pain meds-RN notified    Home Living                          Prior Function            PT Goals (current goals can now be found in the care plan section) Acute Rehab PT Goals PT Goal Formulation: With patient Time For Goal Achievement: 12/07/21    Frequency    Min 3X/week      PT Plan Discharge plan needs to be updated    Co-evaluation PT/OT/SLP Co-Evaluation/Treatment: Yes Reason for Co-Treatment: For patient/therapist safety;To address functional/ADL transfers          AM-PAC PT "6 Clicks" Mobility   Outcome Measure  Help needed turning from your back to your side while in a flat bed without using bedrails?: A Little Help needed moving from lying on your back to sitting on the side of a flat bed without using bedrails?: A Lot Help needed moving to and from a bed to a chair (including a wheelchair)?: Total Help needed standing up from a chair using your arms (e.g., wheelchair or bedside chair)?: Total Help needed to walk in hospital room?: Total Help needed climbing 3-5 steps with a railing? : Total 6 Click Score: 9    End of Session   Activity Tolerance: Patient tolerated treatment well Patient left: in chair;with call bell/phone within reach;with nursing/sitter in room Nurse Communication: Mobility status;Other (comment) (pt requesting pain meds) PT  Visit Diagnosis: Unsteadiness on feet (R26.81);Muscle weakness (generalized) (M62.81);Difficulty in walking, not elsewhere classified (R26.2);Pain Pain - part of body: Ankle and joints of foot     Time: 1108-1150 PT Time Calculation (min) (ACUTE ONLY): 42 min  Charges:  $Therapeutic Activity: 23-37 mins                     [name] [name] R. PTA Acute Rehabilitation Services Office: [place] 12/04/2021, 1:45 PM

## 2021-12-04 NOTE — TOC Transition Note (Addendum)
Transition of Care [facility]) - CM/SW Discharge Note   Patient Details  Name: [name] [name] MRN: [id number] Date of Birth: 25-Jun-1951  Transition of Care [facility]) CM/SW Contact:  Marilu Favre, RN Phone Number: 12/04/2021, 10:31 AM   Clinical Narrative:     Anderson Malta with [facility] called. Patient's episode ended with [facility] so she would be a new start and they cannot accept.    Discussed with patient and sister [name] via speaker phone. They have no preference.   NCM called multiple home health agencies and [place] with [facility] accepted.   Patient and sister aware.   Per Teaching service discharge is today. Patient and sister unaware . [name] will call her aide agency to see if she can arrange an aide. [name] has NCM direct phone number.  Also patient currently on oxygen in hospital and not at home. NCM asked MD if plan is to wean oxygen, also notified patient's nurse. [name] and patient aware.    [place] to patient  at bedside and [name] and discussed patient is two person assist and setting up medicaid transportation to HD. Both are now in agreement for SNF preference in [place]  . NCM explained SW will follow up in morning. Final next level of care: [place] Barriers to Discharge: Continued Medical Work up   Patient Goals and CMS Choice Patient states their goals for this hospitalization and ongoing recovery are:: to return to home CMS Medicare.gov Compare Post Acute Care list provided to:: Patient Choice offered to / list presented to : Patient  Discharge Placement                       Discharge Plan and Services   Discharge Planning Services: CM Consult Post Acute Care Choice: Home Health            DME Agency: NA       HH Arranged: RN          Social Determinants of Health (SDOH) Interventions     Readmission Risk Interventions Readmission Risk Prevention Plan 01/14/2020  Transportation Screening Complete  Medication Review  Press photographer) Complete  PCP or Specialist appointment within 3-5 days of discharge Complete  HRI or Home Care Consult Complete  SW Recovery Care/Counseling Consult Complete  Palliative Care Screening Not [place] Complete  Some recent data might be hidden

## 2021-12-04 NOTE — Progress Notes (Signed)
Patient ID: [name] [name], female   DOB: 1951/04/08, 71 y.o.   MRN: [id number] S:  Asks about vitamin, is on renavite For HD on THS schedule, has CLIP completed to [facility]  O:BP 127/61 (BP Location: Right Leg)    Pulse (!) 106    Temp 99.3 F (37.4 C) (Oral)    Resp 16    Ht 5\' 3"  (1.6 m)    Wt 90 kg    SpO2 98%    BMI 35.15 kg/m  No intake or output data in the 24 hours ending 12/04/21 1317 Intake/Output: No intake/output data recorded.  Intake/Output this shift:  No intake/output data recorded. Weight change:  Gen:NAD CVS: RRR Resp: CTAB Abd:+Bs, soft, NT/ND Ext: 1+ edema of RLE, LUE AVG +T/B; [facility]   Recent Labs  Lab 11/28/21 0311 11/29/21 0313 11/30/21 0745 12/01/21 0752 12/02/21 0602 12/03/21 0045 12/04/21 0130  NA 138 134* 137 135 136 136 133*  K 3.8 4.3 3.9 3.7 4.3 3.8 4.1  CL 97* 95* 97* 97* 99 99 97*  CO2 30 24 28 26 24 25 26   GLUCOSE 146* 244* 136* 131* 126* 242* 226*  BUN 147* 163* 164* 99* 54* 72* 90*  CREATININE 2.85* 3.49* 3.07* 2.29* 2.01* 2.78* 3.45*  ALBUMIN 3.1* 3.0* 3.1* 3.0* 3.1* 2.7* 2.8*  CALCIUM 10.7* 10.0 10.1 9.6 9.3 9.3 9.2  PHOS 4.8* 3.9 4.1 3.9 3.7 3.6 3.9    Liver Function Tests: Recent Labs  Lab 12/02/21 0602 12/03/21 0045 12/04/21 0130  ALBUMIN 3.1* 2.7* 2.8*    No results for input(s): LIPASE, AMYLASE in the last 168 hours. No results for input(s): AMMONIA in the last 168 hours. CBC: Recent Labs  Lab 11/28/21 0311 11/29/21 0313 11/30/21 0745 12/01/21 1816  WBC 12.5* 12.9* 10.6* 13.4*  HGB 8.3* 7.4* 7.4* 8.8*  HCT 27.5* 23.7* 24.6* 27.8*  MCV 89.6 86.5 86.0 85.3  PLT 228 214 242 148*    Cardiac Enzymes: No results for input(s): CKTOTAL, CKMB, CKMBINDEX, TROPONINI in the last 168 hours. CBG: Recent Labs  Lab 12/03/21 0756 12/03/21 1144 12/03/21 1711 12/04/21 0813 12/04/21 1137  GLUCAP 135* 177* 214* 248* 190*     Iron Studies: No results for input(s): IRON, TIBC, TRANSFERRIN, FERRITIN in the last 72  hours. Studies/Results: No results found.  sodium chloride   Intravenous Once   amLODipine  5 mg Oral Daily   carvedilol  12.5 mg Oral BID   Chlorhexidine Gluconate Cloth  6 each Topical Q0600   feeding supplement  237 mL Oral TID BM   fluticasone furoate-vilanterol  1 puff Inhalation Daily   gabapentin  100 mg Oral TID   heparin  5,000 Units Subcutaneous Q8H   hydrALAZINE  50 mg Oral Q8H   insulin aspart  0-9 Units Subcutaneous TID WC   insulin aspart  5 Units Subcutaneous TID WC   insulin glargine-yfgn  20 Units Subcutaneous Daily   levothyroxine  200 mcg Oral Q0600   metoCLOPramide  5 mg Oral TID   multivitamin  1 tablet Oral QHS   pantoprazole  40 mg Oral QHS   senna  2 tablet Oral QHS   sevelamer carbonate  800 mg Oral TID WC   traZODone  50 mg Oral QHS    BMET    Component Value Date/Time   NA 133 (L) 12/04/2021 0130   NA 146 (H) 12/23/2013 1132   K 4.1 12/04/2021 0130   CL 97 (L) 12/04/2021 0130   CO2 26  12/04/2021 0130   GLUCOSE 226 (H) 12/04/2021 0130   BUN 90 (H) 12/04/2021 0130   BUN 31 (H) 12/23/2013 1132   CREATININE 3.45 (H) 12/04/2021 0130   CREATININE 3.51 (HH) 10/17/2020 1234   CREATININE 1.32 (H) 06/29/2014 1100   CALCIUM 9.2 12/04/2021 0130   CALCIUM 9.1 09/07/2021 1036   GFRNONAA 14 (L) 12/04/2021 0130   GFRNONAA 13 (L) 10/17/2020 1234   GFRAA 15 (L) 04/22/2020 1129   CBC    Component Value Date/Time   WBC 13.4 (H) 12/01/2021 1816   RBC 3.26 (L) 12/01/2021 1816   HGB 8.8 (L) 12/01/2021 1816   HCT 27.8 (L) 12/01/2021 1816   HCT 24.6 (L) 10/17/2020 1235   PLT 148 (L) 12/01/2021 1816   MCV 85.3 12/01/2021 1816   MCH 27.0 12/01/2021 1816   MCHC 31.7 12/01/2021 1816   RDW 19.1 (H) 12/01/2021 1816   RDW 15.7 (H) 12/23/2013 1132   LYMPHSABS 0.5 (L) 11/26/2021 1147   LYMPHSABS 2.5 12/23/2013 1132   MONOABS 0.9 11/26/2021 1147   EOSABS 0.1 11/26/2021 1147   EOSABS 0.1 12/23/2013 1132   BASOSABS 0.0 11/26/2021 1147   BASOSABS 0.0 12/23/2013  1132    Assessment/Plan:   Progressive CKD stage V now ESRD:  S/p TDC placement 2/23 followed by her first HD session.   Second HD finished early 12/02/21. For 3rd HD session on 12/05/21: 3K, 300/500, 3.5h, 2.5L UF S/p LUE AVG 12/01/21 placement by Dr. Carlis Abbott.  Dose meds for GFR which is now <15 ml/min Monitor Daily I/Os, Daily weight  Maintain MAP>65 for optimal renal perfusion. Avoid nephrotoxic medications including NSAIDs, IV Contrast and phosphate containing bowel preps (FLEETS)  Use synthetic opioids (Fentanyl/Dilaudid) if needed Has outpatient spot at [facility] on TTS schedule when stable for discharge Congestive heart failure: EF around 40 to 59% with diastolic dysfunction.  Lower extremity edema persistent but also may be multifactorial.  Will manage with UF at HD Left lower Lung nodule - concerning for malignancy and PET scan was recommended.  CT scan of chest showed a new right middle lobe nodule as well concerning for mets.  Dr. Valeta Harms consulted and will arrange for PET scan before attempt at bronchoscopy and biopsy as an outpatient. COPD - longtime smoker.  Continue with nebs per primary.  Likely etiology of SOB.   Hyperphosphatemia: Sevelamer; now at goal  Hypercalemia - resolved.  Anemia of CKD: Iron deficiency.  IV iron.  Monitor hemoglobin trend.  Hold off on ESA for now given possible lung cancer. 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  Follow-up 3 to 6 months. HTN: Coreg 12.5 mg twice daily.  Blood pressure goal Right lower extremity edema: Predominantly on the right side.  Had PVL of the right leg on 2/15 that was negative for DVT.  Improved. Disposition - per primary svc but due to deconditioning will likely require SNF or rehab.  Rexene Agent, MD  [facility]

## 2021-12-04 NOTE — Progress Notes (Signed)
Inpatient Rehabilitation Admissions Coordinator   I met with patient at bedside and spoke with her sister, Dionicia Abler, on conference call per patient request. I discussed that patient is not at a level to be able to participate in the required level for a CIR admit. I recommend SNF but patient is hesitant due to past experiences. Sister and patient would like to discuss with [facility] what outpatient dialysis transportation can be arranged before proceeding further with dispo plans. I will alert acute team and TOC. We will not pursue CIR at this time.  Danne Baxter, RN, MSN Rehab Admissions Coordinator [phone] 12/04/2021 4:14 PM

## 2021-12-04 NOTE — Progress Notes (Signed)
Inpatient Rehab Admissions Coordinator:   Per therapy recommendation,  patient was screened for CIR candidacy by Clemens Catholic, MS, CCC-SLP. At this time, Pt. Appears to be a a potential candidate for CIR. I will place   order for rehab consult per protocol for full assessment. Please contact me any with questions.  Clemens Catholic, [place], [place] Admissions Coordinator  [phone] ([place]) [phone] (office)

## 2021-12-04 NOTE — Progress Notes (Signed)
Met with pt at bedside today to make sure that pt received and reviewed schedule letter that was left on Friday. Pt was off the unit all Friday afternoon so navigator was not able to discuss with pt directly (spoke to pt's husband via phone). Pt voices understanding of arrangements. Pt on the phone with husband as navigator was speaking to pt. Husband states pt is for possible rehab at hospital prior to d/c to home. Advised pt and pt's husband that navigator will contact out-pt clinic to make them aware that pt will not start tomorrow as originally planned. Contacted [facility] to make staff aware pt still hospitalized and will not start tomorrow. Will assist as needed and await final d/c disposition.   Melven Sartorius Renal Navigator [phone]

## 2021-12-05 DIAGNOSIS — N186 End stage renal disease: Secondary | ICD-10-CM | POA: Diagnosis not present

## 2021-12-05 DIAGNOSIS — Z992 Dependence on renal dialysis: Secondary | ICD-10-CM | POA: Diagnosis not present

## 2021-12-05 LAB — CBC
HCT: 24.9 % — ABNORMAL LOW (ref 36.0–46.0)
Hemoglobin: 7.5 g/dL — ABNORMAL LOW (ref 12.0–15.0)
MCH: 27 pg (ref 26.0–34.0)
MCHC: 30.1 g/dL (ref 30.0–36.0)
MCV: 89.6 fL (ref 80.0–100.0)
Platelets: 194 10*3/uL (ref 150–400)
RBC: 2.78 MIL/uL — ABNORMAL LOW (ref 3.87–5.11)
RDW: 19.4 % — ABNORMAL HIGH (ref 11.5–15.5)
WBC: 15.1 10*3/uL — ABNORMAL HIGH (ref 4.0–10.5)
nRBC: 0 % (ref 0.0–0.2)

## 2021-12-05 LAB — RENAL FUNCTION PANEL
Albumin: 2.8 g/dL — ABNORMAL LOW (ref 3.5–5.0)
Anion gap: 12 (ref 5–15)
BUN: 108 mg/dL — ABNORMAL HIGH (ref 8–23)
CO2: 25 mmol/L (ref 22–32)
Calcium: 9.6 mg/dL (ref 8.9–10.3)
Chloride: 97 mmol/L — ABNORMAL LOW (ref 98–111)
Creatinine, Ser: 4.16 mg/dL — ABNORMAL HIGH (ref 0.44–1.00)
GFR, Estimated: 11 mL/min — ABNORMAL LOW (ref >60)
Glucose, Bld: 176 mg/dL — ABNORMAL HIGH (ref 70–99)
Phosphorus: 3.6 mg/dL (ref 2.5–4.6)
Potassium: 4.6 mmol/L (ref 3.5–5.1)
Sodium: 134 mmol/L — ABNORMAL LOW (ref 135–145)

## 2021-12-05 LAB — GLUCOSE, CAPILLARY
Glucose-Capillary: 204 mg/dL — ABNORMAL HIGH (ref 70–99)
Glucose-Capillary: 155 mg/dL — ABNORMAL HIGH (ref 70–99)
Glucose-Capillary: 236 mg/dL — ABNORMAL HIGH (ref 70–99)
Glucose-Capillary: 255 mg/dL — ABNORMAL HIGH (ref 70–99)

## 2021-12-05 MED ORDER — HEPARIN SODIUM (PORCINE) 1000 UNIT/ML IJ SOLN
1000.0000 [IU] | INTRAMUSCULAR | Status: DC | PRN
Start: 2021-12-05 — End: 2021-12-08
  Administered 2021-12-05: 3200 [IU] via INTRAVENOUS
  Filled 2021-12-05 (×3): qty 1

## 2021-12-05 NOTE — Procedures (Signed)
I was present at this dialysis session. I have reviewed the session itself and made appropriate changes.   2K. UF goal initially 2.5L but has IDH, reduce to 1.5L.  TDC Qb 300.    CIR considered but not a candidate.  DC plans in process.  No further inpatient renal needs.   Filed Weights   12/02/21 0500 12/03/21 0358 12/05/21 0825  Weight: 92.9 kg 90 kg 95.7 kg    Recent Labs  Lab 12/05/21 0119  NA 134*  K 4.6  CL 97*  CO2 25  GLUCOSE 176*  BUN 108*  CREATININE 4.16*  CALCIUM 9.6  PHOS 3.6    Recent Labs  Lab 11/30/21 0745 12/01/21 1816 12/05/21 0832  WBC 10.6* 13.4* 15.1*  HGB 7.4* 8.8* 7.5*  HCT 24.6* 27.8* 24.9*  MCV 86.0 85.3 89.6  PLT 242 148* 194    Scheduled Meds:  sodium chloride   Intravenous Once   amLODipine  5 mg Oral Daily   carvedilol  12.5 mg Oral BID   Chlorhexidine Gluconate Cloth  6 each Topical Q0600   feeding supplement  237 mL Oral TID BM   fluticasone furoate-vilanterol  1 puff Inhalation Daily   gabapentin  100 mg Oral TID   heparin  5,000 Units Subcutaneous Q8H   hydrALAZINE  50 mg Oral Q8H   insulin aspart  0-9 Units Subcutaneous TID WC   insulin aspart  5 Units Subcutaneous TID WC   insulin glargine-yfgn  20 Units Subcutaneous Daily   levothyroxine  200 mcg Oral Q0600   metoCLOPramide  5 mg Oral TID   multivitamin  1 tablet Oral QHS   pantoprazole  40 mg Oral QHS   senna  2 tablet Oral QHS   sevelamer carbonate  800 mg Oral TID WC   traZODone  50 mg Oral QHS   Continuous Infusions: PRN Meds:.acetaminophen **OR** acetaminophen, bisacodyl, heparin sodium (porcine), HYDROmorphone, polyethylene glycol   [name] Grippe  MD 12/05/2021, 10:01 AM

## 2021-12-05 NOTE — TOC Progression Note (Signed)
Transition of Care [facility]) - Progression Note    Patient Details  Name: [name] [name] MRN: [id number] Date of Birth: [birth year]  [facility]) CM/SW Contact  Emeterio Reeve, [place] Phone Number: 12/05/2021, 9:44 AM  Clinical Narrative:     CSW was informed that pt and family are now wanting SNF, preferably [facility]. CSW refaxed out pt in the hub to include HD needs.   Expected Discharge Plan: [place] Barriers to Discharge: Continued Medical Work up  Expected Discharge Plan and Services Expected Discharge Plan: [place]   Discharge Planning Services: CM Consult Post Acute Care Choice: [place] arrangements for the past 2 months: Apartment                   DME Agency: NA       HH Arranged: RN           Social Determinants of Health (SDOH) Interventions    Readmission Risk Interventions Readmission Risk Prevention Plan 01/14/2020  Transportation Screening Complete  Medication Review Press photographer) Complete  PCP or Specialist appointment within 3-5 days of discharge Complete  HRI or [place] Complete  SW Recovery Care/Counseling Consult Complete  Palliative Care Screening Not [place] Complete  Some recent data might be hidden   Emeterio Reeve, LCSW Clinical Social Worker

## 2021-12-05 NOTE — Telephone Encounter (Signed)
I am holding the PET scan order.  PET can not be scheduled while pt is still admitted.

## 2021-12-05 NOTE — Progress Notes (Signed)
HD#12 SUBJECTIVE:  Patient Summary: [name] [name] is a 71 y.o. with a pertinent PMH of DM2 and CKD stage 5, who presented with LOC and admitted for syncope. Patient found to be uremic. Eventually underwent R TDC and AVG. Started on HD  Overnight Events:  no acute events overnight.    Interm History: Patient seen and evaluated. Sitting up in chair. Patient wanting to go to SNF. No complaints at this time.   OBJECTIVE:  Vital Signs: Vitals:   12/05/21 1130 12/05/21 1159 12/05/21 1244 12/05/21 1550  BP: (!) 104/52 121/60 (!) 117/58 (!) 103/52  Pulse: 83 90 87 91  Resp:  12 16 18   Temp:  (!) 97 F (36.1 C)  98.4 F (36.9 C)  TempSrc:  Oral    SpO2:  96% 99% 99%  Weight:  94 kg    Height:       Constitutional: Well-developed, well-nourished,  alert Neck: Normal range of motion.  Cardiovascular: Normal rate, regular rhythm, intact distal pulses. No gallop and no friction rub. No murmur heard. HD cath in place right upper chest no surrounding erythema or drainage. Left antecubital AVG with palpable thrill. Mild erythema.  Pulmonary: Comfortable with normal work of breathing while not wearing [place].  Abdominal: Soft. +bowel sounds. Non distended. No tenderness, nonperitonitic Musculoskeletal: Normal range of motion. Tender lower extremities, right more edematous than left, nonpitting, BLE edema significantly improved.  Neurological: Awake and alert.  Skin: wound left shin. Bandaged. No drainage.   Pertinent Labs: CBC Latest Ref Rng & Units 12/05/2021 12/01/2021 11/30/2021  WBC 4.0 - 10.5 K/uL 15.1(H) 13.4(H) 10.6(H)  Hemoglobin 12.0 - 15.0 g/dL 7.5(L) 8.8(L) 7.4(L)  Hematocrit 36.0 - 46.0 % 24.9(L) 27.8(L) 24.6(L)  Platelets 150 - 400 K/uL 194 148(L) 242    CMP Latest Ref Rng & Units 12/05/2021 12/04/2021 12/03/2021  Glucose 70 - 99 mg/dL 176(H) 226(H) 242(H)  BUN 8 - 23 mg/dL 108(H) 90(H) 72(H)  Creatinine 0.44 - 1.00 mg/dL 4.16(H) 3.45(H) 2.78(H)  Sodium 135 - 145 mmol/L  134(L) 133(L) 136  Potassium 3.5 - 5.1 mmol/L 4.6 4.1 3.8  Chloride 98 - 111 mmol/L 97(L) 97(L) 99  CO2 22 - 32 mmol/L 25 26 25   Calcium 8.9 - 10.3 mg/dL 9.6 9.2 9.3  Total Protein 6.5 - 8.1 g/dL - - -  Total Bilirubin 0.3 - 1.2 mg/dL - - -  Alkaline Phos 38 - 126 U/L - - -  AST 15 - 41 U/L - - -  ALT 0 - 44 U/L - - -     ASSESSMENT/PLAN:   Assessment:  #Newly Dialysis Dependent ESRD - She is s/p [facility] 2/23 and AVG 2/24. She has had 2 HD sessions thus far, with plans for 3rd HD session 2/28. She has been accepted at [facility] and will be initiated on TTS schedule once discharged. - Avoid nephrotoxic drugs and dose medications per GFR. She has neuropathic pain in her legs and can be given small doses 100mg  Gabapentin as needed for a maximum of 300mg  daily. For more severe pain, dilaudid can be given.  - Continue Sevelamer for her hyperphosphatemia  - Strict I&Os, daily weights, telemetry  - patient is stable medically, however, she has severe deconditioning and requires significant support to transition from bed to chair. She will likely require SNF vs HH PT on discharge.   Deconditioning - patient is severely deconditioned and requires 2 people to assist her with transitioning from bed to her  chair. Patient had initially wanted to discharge home with assistance from her sister, however, given assistance requirements here, do not believe she would be successful at home upon discharge. Patient would require much assistance with transferring to HD sessions 3 times weekly. She has been evaluated by PT with most recent recommendation for CIR, however, patient was evaluated and not thought to be good candidate. After Discussions with patient and her family, have decided to pursue SNF upon discharge.  - PT will need to evaluate patient. -TOC assisting with dispo planning.  #Acute Hypoxic Respiratory Failure 2/2 pulmonary edema and Possible COPD exacerbation  - resolved Patient had been  reporting shortness of breath and was found to have pulmonary edema on CXR. She improved with IV lasix and duonebs and steroids.  - She  uses Symbicort at home and was started on Breo, duonebs, and prednisone for suspected COPD exacerbation. She has completed her steroid course. Breathing status has improved since initiation of breathing treatments as well. She is breathing comfortably and satting 100% on RA.  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 Initially noted on CT scan 2020. Due to its enlarging nature, nodule was felt to be malignant and further further evaluated with dedicated chest CT which demonstrated the LLL nodule had greatly enlarged from 2020 concerning for primary lung malignancy.  Additionally, a new right middle lobe nodule seen on imaging, possible post-infectious/inflammatory was also observed and will require follow up to exclude metastasis.  - PCCM has been consulted. No plans for Bx this admission. She has been scheduled for outpatient PET scan and follow up in their office.    #Acute on Chronic HFmrEF Patient's echo 02/2021.  EF noted to be 45 to 50% at that time.  There was no wall motion abnormalities.  She was noted to have left ventricular hypertrophy, no valvulopathy, left atrium was noted to be severely dilated. ECHO shows mild worsening of EF to 40-45% in setting of worsening uremia.  Patient is on torsemide milligrams twice daily per EMR as well as metolazone 3 times a week. Her Home diuretics are being held for now. Volume status stable on HD - stable. Monitor.   #R Ulnar vein DVT #R cephalic vein thrombosis  #? Superficial cellulitis-resolved  - resolved - vascular US obtained showing superficial thrombosis of R cephalic as well as DVT in R ulnar vein. Superficial thrombus able to be managed with warm  compresses. More distal DVT has low probability of embolizing and can be observed at this time.  -Cannot exclude possibility of cellulitis given leukocytosis, though she has been afebrile which is more suggestive of reactive leukocytosis. Blood Cx remain negative and patient completed a 5 d course of augmentin   #Chronic Pain, Chronic use of opioids - has not been receiving her home oxycodone here. On review of PDMP, she receives #120 tablets of percocet 10-325 a month and fills regularly. With her ESRD, we have added oral dilaudid PRN rather than resuming her home oxy.  - Oral dilaudid PRN    #Constipation 2x stools this AM. Continue daily bowel regimen.     #T2DM Patient's last A1c was 02/2021 and noted to be 6.8.  Per EMR she takes 75/25 insulin 40 units in the a.m. and 30 units in the evening as well as Trulicity 1.5 mg weekly.  Patient is also on Reglan with 3 times daily dosing. This hospitalization she has required Semglee 15u qd + 5u aspart WC and SSI.  -Will continue this current regimen  #Renal mass  Patient noted to have interval enlargement of L kidney exophytic density (now 3.2 cm) compared to 02/2021 imaging - patient underwent MRI imaging of this mass. 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 Patient was noted to be significantly iron deficient on labs 09/2021 with a ferritin of 11 and a transferrin saturation of 7%.  She did receive IV iron in the past for this most recently, Feraheme 10/04/2021. - she has been started on IV iron  Delene Ruffini, PGY1 Internal Medicine  Pager [phone]  6:47 PM, 12/05/2021  Please contact the on call pager after 5 pm and on weekends at [phone].

## 2021-12-05 NOTE — Progress Notes (Signed)
Mobility Specialist Progress Note:   12/05/21 1511  Mobility  Bed Position Chair  Activity Transferred from bed to chair  Level of Assistance Total care  Assistive Device MaxiMove  Activity Response Tolerated well  $Mobility charge 1 Mobility   Pt wanting to move to chair. Required max A +2 to roll to get maximove pad under pt. Left in chair with call bell in reach and all needs met.   [facility] Public librarian Phone [phone]

## 2021-12-05 NOTE — Progress Notes (Signed)
Pt off unit at HD

## 2021-12-06 DIAGNOSIS — Z992 Dependence on renal dialysis: Secondary | ICD-10-CM | POA: Diagnosis not present

## 2021-12-06 DIAGNOSIS — Z79891 Long term (current) use of opiate analgesic: Secondary | ICD-10-CM

## 2021-12-06 DIAGNOSIS — N186 End stage renal disease: Secondary | ICD-10-CM | POA: Diagnosis not present

## 2021-12-06 DIAGNOSIS — E1122 Type 2 diabetes mellitus with diabetic chronic kidney disease: Secondary | ICD-10-CM | POA: Diagnosis not present

## 2021-12-06 DIAGNOSIS — L8990 Pressure ulcer of unspecified site, unspecified stage: Secondary | ICD-10-CM | POA: Insufficient documentation

## 2021-12-06 DIAGNOSIS — R911 Solitary pulmonary nodule: Secondary | ICD-10-CM | POA: Diagnosis not present

## 2021-12-06 LAB — BLOOD GAS, ARTERIAL
Acid-Base Excess: 3.1 mmol/L — ABNORMAL HIGH (ref 0.0–2.0)
Bicarbonate: 29.5 mmol/L — ABNORMAL HIGH (ref 20.0–28.0)
Drawn by: 51147
O2 Saturation: 97.8 %
Patient temperature: 37.2
pCO2 arterial: 51 mmHg — ABNORMAL HIGH (ref 32–48)
pH, Arterial: 7.37 (ref 7.35–7.45)
pO2, Arterial: 110 mmHg — ABNORMAL HIGH (ref 83–108)

## 2021-12-06 LAB — RENAL FUNCTION PANEL
Albumin: 2.6 g/dL — ABNORMAL LOW (ref 3.5–5.0)
Anion gap: 11 (ref 5–15)
BUN: 59 mg/dL — ABNORMAL HIGH (ref 8–23)
CO2: 24 mmol/L (ref 22–32)
Calcium: 9.4 mg/dL (ref 8.9–10.3)
Chloride: 99 mmol/L (ref 98–111)
Creatinine, Ser: 3.11 mg/dL — ABNORMAL HIGH (ref 0.44–1.00)
GFR, Estimated: 15 mL/min — ABNORMAL LOW (ref >60)
Glucose, Bld: 162 mg/dL — ABNORMAL HIGH (ref 70–99)
Phosphorus: 2.1 mg/dL — ABNORMAL LOW (ref 2.5–4.6)
Potassium: 4.6 mmol/L (ref 3.5–5.1)
Sodium: 134 mmol/L — ABNORMAL LOW (ref 135–145)

## 2021-12-06 LAB — RESP PANEL BY RT-PCR (FLU A&B, COVID) ARPGX2
Influenza A by PCR: NEGATIVE
Influenza B by PCR: NEGATIVE
SARS Coronavirus 2 by RT PCR: NEGATIVE

## 2021-12-06 LAB — GLUCOSE, CAPILLARY
Glucose-Capillary: 143 mg/dL — ABNORMAL HIGH (ref 70–99)
Glucose-Capillary: 187 mg/dL — ABNORMAL HIGH (ref 70–99)
Glucose-Capillary: 203 mg/dL — ABNORMAL HIGH (ref 70–99)
Glucose-Capillary: 201 mg/dL — ABNORMAL HIGH (ref 70–99)

## 2021-12-06 MED ORDER — HYDROMORPHONE HCL 2 MG PO TABS: 1.0000 mg | ORAL_TABLET | Freq: Four times a day (QID) | ORAL | Status: DC | PRN

## 2021-12-06 MED ORDER — ALBUTEROL SULFATE (2.5 MG/3ML) 0.083% IN NEBU
2.5000 mg | INHALATION_SOLUTION | Freq: Four times a day (QID) | RESPIRATORY_TRACT | Status: DC
  Administered 2021-12-06 (×2): 2.5 mg via RESPIRATORY_TRACT
  Filled 2021-12-06 (×2): qty 3

## 2021-12-06 MED ORDER — HYDROMORPHONE HCL 2 MG PO TABS: 2.0000 mg | ORAL_TABLET | ORAL | Status: DC | PRN

## 2021-12-06 MED ORDER — ACETAMINOPHEN 500 MG PO TABS
1000.0000 mg | ORAL_TABLET | Freq: Three times a day (TID) | ORAL | Status: DC
Start: 2021-12-06 — End: 2021-12-08
  Administered 2021-12-06: 1000 mg via ORAL
  Filled 2021-12-06 (×5): qty 2

## 2021-12-06 MED ORDER — HYDROMORPHONE HCL 2 MG PO TABS
1.0000 mg | ORAL_TABLET | Freq: Four times a day (QID) | ORAL | Status: DC | PRN
Start: 2021-12-06 — End: 2021-12-07
  Administered 2021-12-07: 1 mg via ORAL
  Filled 2021-12-06: qty 1

## 2021-12-06 MED ORDER — IPRATROPIUM-ALBUTEROL 0.5-2.5 (3) MG/3ML IN SOLN
3.0000 mL | Freq: Once | RESPIRATORY_TRACT | Status: AC
  Administered 2021-12-06: 3 mL via RESPIRATORY_TRACT
  Filled 2021-12-06: qty 3

## 2021-12-06 MED ORDER — HYDROMORPHONE HCL 2 MG PO TABS: 1.0000 mg | ORAL_TABLET | ORAL | Status: DC | PRN

## 2021-12-06 MED ORDER — ALBUTEROL SULFATE (2.5 MG/3ML) 0.083% IN NEBU
2.5000 mg | INHALATION_SOLUTION | Freq: Four times a day (QID) | RESPIRATORY_TRACT | Status: DC
Start: 2021-12-06 — End: 2021-12-06

## 2021-12-06 NOTE — Progress Notes (Signed)
Patient ID: [name] [name], female   DOB: 04-18-1951, 71 y.o.   MRN: [id number] ?S:  ?HD yesterday 1.5L UF ?SNF bed search in process ?Has IDH and Low BPs ? ?O:BP (!) 103/47 (BP Location: Right Arm)   Pulse 93   Temp 98.5 ?F (36.9 ?C) (Oral)   Resp 18   Ht '5\' 3"'  (1.6 m)   Wt 94 kg   SpO2 99%   BMI 36.71 kg/m?  ? ?Intake/Output Summary (Last 24 hours) at 12/06/2021 1234 ?Last data filed at 12/06/2021 1010 ?Gross per 24 hour  ?Intake 480 ml  ?Output 200 ml  ?Net 280 ml  ? ?Intake/Output: ?I/O last 3 completed shifts: ?In: 240 [P.O.:240] ?Out: 1900 [Urine:400; Other:1500] ? Intake/Output this shift: ? Total I/O ?In: 240 [P.O.:240] ?Out: -  ?Weight change:  ?Gen:NAD ?CVS: RRR ?Resp: CTAB ?Abd:+Bs, soft, NT/ND ?Ext: trace edema of RLE, LUE AVG +T/B; TDC  ? ?Recent Labs  ?Lab 11/30/21 ?0745 12/01/21 ?2060 12/02/21 ?0602 12/03/21 ?0045 12/04/21 ?0130 12/05/21 ?0119 12/06/21 ?0134  ?NA 137 135 136 136 133* 134* 134*  ?K 3.9 3.7 4.3 3.8 4.1 4.6 4.6  ?CL 97* 97* 99 99 97* 97* 99  ?CO2 '28 26 24 25 26 25 24  ' ?GLUCOSE 136* 131* 126* 242* 226* 176* 162*  ?BUN 164* 99* 54* 72* 90* 108* 59*  ?CREATININE 3.07* 2.29* 2.01* 2.78* 3.45* 4.16* 3.11*  ?ALBUMIN 3.1* 3.0* 3.1* 2.7* 2.8* 2.8* 2.6*  ?CALCIUM 10.1 9.6 9.3 9.3 9.2 9.6 9.4  ?PHOS 4.1 3.9 3.7 3.6 3.9 3.6 2.1*  ? ? ?Liver Function Tests: ?Recent Labs  ?Lab 12/04/21 ?0130 12/05/21 ?0119 12/06/21 ?0134  ?ALBUMIN 2.8* 2.8* 2.6*  ? ? ?No results for input(s): LIPASE, AMYLASE in the last 168 hours. ?No results for input(s): AMMONIA in the last 168 hours. ?CBC: ?Recent Labs  ?Lab 11/30/21 ?0745 12/01/21 ?1816 12/05/21 ?1561  ?WBC 10.6* 13.4* 15.1*  ?HGB 7.4* 8.8* 7.5*  ?HCT 24.6* 27.8* 24.9*  ?MCV 86.0 85.3 89.6  ?PLT 242 148* 194  ? ? ?Cardiac Enzymes: ?No results for input(s): CKTOTAL, CKMB, CKMBINDEX, TROPONINI in the last 168 hours. ?CBG: ?Recent Labs  ?Lab 12/05/21 ?1249 12/05/21 ?1551 12/05/21 ?2015 12/06/21 ?5379 12/06/21 ?1143  ?GLUCAP 155* 236* 255* 143* 187*  ? ? ? ?Iron  Studies: No results for input(s): IRON, TIBC, TRANSFERRIN, FERRITIN in the last 72 hours. ?Studies/Results: ?No results found. ? sodium chloride   Intravenous Once  ? albuterol  2.5 mg Nebulization Q6H  ? amLODipine  5 mg Oral Daily  ? carvedilol  12.5 mg Oral BID  ? Chlorhexidine Gluconate Cloth  6 each Topical Q0600  ? feeding supplement  237 mL Oral TID BM  ? fluticasone furoate-vilanterol  1 puff Inhalation Daily  ? gabapentin  100 mg Oral TID  ? heparin  5,000 Units Subcutaneous Q8H  ? hydrALAZINE  50 mg Oral Q8H  ? insulin aspart  0-9 Units Subcutaneous TID WC  ? insulin aspart  5 Units Subcutaneous TID WC  ? insulin glargine-yfgn  20 Units Subcutaneous Daily  ? levothyroxine  200 mcg Oral Q0600  ? metoCLOPramide  5 mg Oral TID  ? multivitamin  1 tablet Oral QHS  ? pantoprazole  40 mg Oral QHS  ? senna  2 tablet Oral QHS  ? sevelamer carbonate  800 mg Oral TID WC  ? traZODone  50 mg Oral QHS  ? ? ?BMET ?   ?Component Value Date/Time  ? NA 134 (L) 12/06/2021 0134  ?  NA 146 (H) 12/23/2013 1132  ? K 4.6 12/06/2021 0134  ? CL 99 12/06/2021 0134  ? CO2 24 12/06/2021 0134  ? GLUCOSE 162 (H) 12/06/2021 0134  ? BUN 59 (H) 12/06/2021 0134  ? BUN 31 (H) 12/23/2013 1132  ? CREATININE 3.11 (H) 12/06/2021 0134  ? CREATININE 3.51 (HH) 10/17/2020 1234  ? CREATININE 1.32 (H) 06/29/2014 1100  ? CALCIUM 9.4 12/06/2021 0134  ? CALCIUM 9.1 09/07/2021 1036  ? GFRNONAA 15 (L) 12/06/2021 0134  ? GFRNONAA 13 (L) 10/17/2020 1234  ? GFRAA 15 (L) 04/22/2020 1129  ? ?CBC ?   ?Component Value Date/Time  ? WBC 15.1 (H) 12/05/2021 1655  ? RBC 2.78 (L) 12/05/2021 3748  ? HGB 7.5 (L) 12/05/2021 2707  ? HCT 24.9 (L) 12/05/2021 8675  ? HCT 24.6 (L) 10/17/2020 1235  ? PLT 194 12/05/2021 0832  ? MCV 89.6 12/05/2021 0832  ? MCH 27.0 12/05/2021 0832  ? MCHC 30.1 12/05/2021 0832  ? RDW 19.4 (H) 12/05/2021 4492  ? RDW 15.7 (H) 12/23/2013 1132  ? LYMPHSABS 0.5 (L) 11/26/2021 1147  ? LYMPHSABS 2.5 12/23/2013 1132  ? MONOABS 0.9 11/26/2021 1147  ?  EOSABS 0.1 11/26/2021 1147  ? EOSABS 0.1 12/23/2013 1132  ? BASOSABS 0.0 11/26/2021 1147  ? BASOSABS 0.0 12/23/2013 1132  ? ? ?Assessment/Plan: ?  ?Progressive CKD stage V now ESRD:  ?S/p TDC placement 2/23 followed by her first HD session.   ?Started 2/23 ?Ongoing THS: 2K, 400/600, 3.5h, 2-3L UF as able ?S/p LUE AVG 12/01/21 placement by Dr. Carlis Abbott.  ?Renal navigator assisting with CLIP based on SNF ?Congestive heart failure: EF around 40 to 01% with diastolic dysfunction.  Will manage with UF at HD ?Left lower Lung nodule - concerning for malignancy and PET scan was recommended.  CT scan of chest showed a new right middle lobe nodule as well concerning for mets.  Dr. Valeta Harms consulted and will arrange for PET scan before attempt at bronchoscopy and biopsy as an outpatient. ?COPD - longtime smoker.  Continue with nebs per primary.  Likely etiology of SOB.   ?Hyperphosphatemia: Sevelamer; now at goal ? Hypercalemia - resolved.  ?Anemia of CKD: Iron deficiency.  IV iron.  Monitor hemoglobin trend.  Hold off on ESA for now given possible lung cancer. 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  Follow-up 3 to 6 months. ?HTN: Amlod, hydra, Coreg 12.5 mg twice daily.  Blood pressure running low, having IDH, stop hydralazine ?Right lower extremity edema: Predominantly on the right side.  Had PVL of the right leg on 2/15 that was negative for DVT.  Improved. ?Disposition - for SNF at DC ? ?Rexene Agent, MD  ?[facility] ? ?

## 2021-12-06 NOTE — TOC Progression Note (Signed)
Transition of Care (TOC) - Progression Note  ? ? ?Patient Details  ?Name: [name] [name] ?MRN: [id number] ?Date of Birth: [birth year] ? ?Transition of Care (TOC) CM/SW Contact  ?Emeterio Reeve, LCSW ?Phone Number: ?12/06/2021, 4:45 PM ? ?Clinical Narrative:    ? ?CSW informed pt that she was accepted to [facility] for SNF. CSW confirmed that [facility] can acept her and transport her to HD. Pts sister [name] was present. CSW started insurance auth. CSW requested pt goes to HD early so she will be able to DC after. MD is aware. ? ?Expected Discharge Plan: [place] ?Barriers to Discharge: Continued Medical Work up ? ?Expected Discharge Plan and Services ?Expected Discharge Plan: [place] ?  ?Discharge Planning Services: CM Consult ?Post Acute Care Choice: Home Health ?Living arrangements for the past 2 months: Apartment ?                ?  ?DME Agency: NA ?  ?  ?  ?HH Arranged: RN ?  ?  ?  ?  ? ? ?Social Determinants of Health (SDOH) Interventions ?  ? ?Readmission Risk Interventions ?Readmission Risk Prevention Plan 01/14/2020  ?Transportation Screening Complete  ?Medication Review Press photographer) Complete  ?PCP or Specialist appointment within 3-5 days of discharge Complete  ?[place] or Home Care Consult Complete  ?SW Recovery Care/Counseling Consult Complete  ?Palliative Care Screening Not Applicable  ?Skilled Nursing Facility Complete  ?Some recent data might be hidden  ? ?Emeterio Reeve, LCSW ?Clinical Social Worker ? ?

## 2021-12-06 NOTE — Progress Notes (Signed)
Physical Therapy Treatment ?Patient Details ?Name: DHWANI VENKATESH ?MRN: [id number] ?DOB: [birth year] ?Today's Date: 12/06/2021 ? ? ?History of Present Illness Pt is a 71 year old woman who came to the ED on 11/21/21 after syncopal after straining on toilet (thought to be vasovagal), abdominal pain, vomiting and constipation. Pulmonary and renal imaging concerning for malignancy.Pt started on HD this admission. PMH: COPD, CKD5, CHF with EF of 40-45%, OSA, gout, iron deficiency, low back pain, LE venous insufficiency with L LE wound, HTN, TIA, DM2. ? ?  ?PT Comments  ? ? Pt received supine and agreeable to session. Pt needing mod-max assist for all bed mobility to elevate trunk and for BLE management with increased time given for pt to initiate movement and do as much independently as possible. Once EOB pt needing min assist to maintain upright posture to begin but able to maintain seated balance for >10 mins. Pt needing max assist to power up for pivot transfer to recliner with pt unable to clear hips from elevated EOB this session. Attempted multiple times but pt unable. Pt and pt sister, [name], stated they are agreeable to SNF at d/c and verbalized understanding for pt safety and current amount of assist needed. Pt continues to benefit from skilled PT services to progress toward functional mobility goals.  ? ?  ?Recommendations for follow up therapy are one component of a multi-disciplinary discharge planning process, led by the attending physician.  Recommendations may be updated based on patient status, additional functional criteria and insurance authorization. ? ?Follow Up Recommendations ? Acute inpatient rehab (3hours/day) ?  ?  ?Assistance Recommended at Discharge Frequent or constant Supervision/Assistance  ?Patient can return home with the following Assistance with cooking/housework;Assist for transportation;Help with stairs or ramp for entrance;A lot of help with walking and/or transfers;A lot of help with  bathing/dressing/bathroom ?  ?Equipment Recommendations ? None recommended by PT  ?  ?Recommendations for Other Services   ? ? ?  ?Precautions / Restrictions Precautions ?Precautions: Fall ?Precaution Comments: partial stand pivots at baseline, does better without RW ?Restrictions ?Weight Bearing Restrictions: No  ?  ? ?Mobility ? Bed Mobility ?Overal bed mobility: Needs Assistance ?Bed Mobility: Supine to Sit, Sit to Supine ?Rolling: Total assist ?  ?Supine to sit: Mod assist ?Sit to supine: Mod assist ?Sit to sidelying: Max assist ?General bed mobility comments: mod-total assist for pt to come sitting EOB, assis to elevate trunk and remain upright, to bring BLE to/off/on bed and to respostion in bed. pt wanting to pivot transfer to chairr but pt unable to initate STS with max assist to power up, pt resisting and shifting weight posterior. transfers defered this session. pt able to sit EOB >10 mins and maintain balance. ?  ? ?Transfers ?  ?  ?  ?  ?  ?  ?  ?  ?  ?  ?  ? ?Ambulation/Gait ?  ?  ?  ?  ?  ?  ?  ?  ? ? ?Stairs ?  ?  ?  ?  ?  ? ? ?Wheelchair Mobility ?  ? ?Modified Rankin (Stroke Patients Only) ?  ? ? ?  ?Balance Overall balance assessment: Needs assistance ?  ?Sitting balance-Leahy Scale: Fair ?  ?  ?  ?Standing balance-Leahy Scale: Poor ?Standing balance comment: reliant on B UE and external support, does not stand upright ?  ?  ?  ?  ?  ?  ?  ?  ?  ?  ?  ?  ? ?  ?  Cognition Arousal/Alertness: Awake/alert ?Behavior During Therapy: [facility] for tasks assessed/performed ?Overall Cognitive Status: Impaired/Different from baseline ?Area of Impairment: Memory, Following commands, Safety/judgement, Problem solving ?  ?  ?  ?  ?  ?  ?  ?  ?  ?  ?Memory: Decreased short-term memory ?Following Commands: Follows one step commands with increased time, Follows one step commands consistently ?Safety/Judgement: Decreased awareness of deficits, Decreased awareness of safety ?  ?Problem Solving: Slow processing, Decreased  initiation, Difficulty sequencing, Requires verbal cues, Requires tactile cues ?General Comments: Pt and sister agreeable this session with d/c to SNF ?  ?  ? ?  ?Exercises General Exercises - Lower Extremity ?Ankle Circles/Pumps: AROM, Both, 20 reps, Seated ? ?  ?General Comments   ?  ?  ? ?Pertinent Vitals/Pain Pain Assessment ?Pain Assessment: Faces ?Faces Pain Scale: Hurts little more ?Pain Location: L UE at HD fistula, back ?Pain Descriptors / Indicators: Sore, Discomfort, Grimacing, Guarding ?Pain Intervention(s): Limited activity within patient's tolerance, Monitored during session, Repositioned  ? ? ?Home Living   ?  ?  ?  ?  ?  ?  ?  ?  ?  ?   ?  ?Prior Function    ?  ?  ?   ? ?PT Goals (current goals can now be found in the care plan section) Acute Rehab PT Goals ?PT Goal Formulation: With patient ?Time For Goal Achievement: 12/07/21 ? ?  ?Frequency ? ? ? Min 3X/week ? ? ? ?  ?PT Plan    ? ? ?Co-evaluation   ?  ?  ?  ?  ? ?  ?AM-PAC PT "6 Clicks" Mobility   ?Outcome Measure ? Help needed turning from your back to your side while in a flat bed without using bedrails?: A Lot ?Help needed moving from lying on your back to sitting on the side of a flat bed without using bedrails?: Total ?Help needed moving to and from a bed to a chair (including a wheelchair)?: Total ?Help needed standing up from a chair using your arms (e.g., wheelchair or bedside chair)?: Total ?Help needed to walk in hospital room?: Total ?Help needed climbing 3-5 steps with a railing? : Total ?6 Click Score: 7 ? ?  ?End of Session Equipment Utilized During Treatment: Gait belt ?Activity Tolerance: Patient limited by fatigue;Patient limited by lethargy ?Patient left: in bed;with call bell/phone within reach;with bed alarm set;with family/visitor present ?Nurse Communication: Mobility status ?PT Visit Diagnosis: Unsteadiness on feet (R26.81);Muscle weakness (generalized) (M62.81);Difficulty in walking, not elsewhere classified  (R26.2);Pain ?Pain - part of body: Ankle and joints of foot ?  ? ? ?Time: 1594-5859 ?PT Time Calculation (min) (ACUTE ONLY): 30 min ? ?Charges:  $Therapeutic Activity: 23-37 mins          ?          ? ?Audry Riles. PTA ?Acute Rehabilitation Services ?Office: [phone] ? ? ? ?Betsey Holiday Ferrin Liebig ?12/06/2021, 3:57 PM ? ?

## 2021-12-06 NOTE — Progress Notes (Addendum)
Contacted by CSW on 2/28 that family is now interested in snf at d/c. Pt was accepted at [facility] but navigator can assist with changing clinic if needed. CSW aware of this info. Will assist as needed.  ? ?Melven Sartorius ?Renal Navigator ?[phone] ? ?Addendum at 2:11 pm: ?Contacted by CSW to say that pt has been accepted at [facility]. CSW confirms that snf is able to provide transport to [facility] on TTS. Pt for possible d/c tomorrow. Contacted [facility] and spoke to [place]. Clinic is able to start pt on Saturday and pt will need to arrive at 11:15 to complete paperwork prior to 11:45 chair time. This info provided to [place] who will provide to snf. Contacted inpt HD unit to request that pt be placed on first shift tomorrow (per CSW request) in order for pt to have a timely d/c to snf.  ?

## 2021-12-06 NOTE — Care Management Important Message (Signed)
Important Message ? ?Patient Details  ?Name: DENNA FRYBERGER ?MRN: [id number] ?Date of Birth: October 28, 1950 ? ? ?Medicare Important Message Given:  Yes ? ? ? ? ?Levada Dy  Lisamarie Coke-Martin ?12/06/2021, 2:09 PM ?

## 2021-12-06 NOTE — Progress Notes (Addendum)
? ? ?HD#13 ?Subjective:  ?Overnight Events: NAEO ? ? Patient is upset with how things have been going in the hospital. She does not feel the same sense of independence she has at home and feels like people are always poking and prodding her and don't always tell her what they are going to do before they do it. She hurts all over and just wants to go home. ? ?Objective:  ?Vital signs in last 24 hours: ?Vitals:  ? 12/06/21 0500 12/06/21 0535 12/06/21 0839 12/06/21 0849  ?BP:  (!) 124/56 (!) 103/47   ?Pulse:  100 93   ?Resp:  18 18   ?Temp:  98.8 ?F (37.1 ?C) 98.5 ?F (36.9 ?C)   ?TempSrc:  Oral Oral   ?SpO2:  100% 100% 99%  ?Weight: 94 kg     ?Height:      ? ?Supplemental O2: Room Air ?SpO2: 99 % ?O2 Flow Rate (L/min): 2 L/min ? ? ?Physical Exam:  ?Constitutional: Uncomfortable appearing obese female lying in bed ?HEENT: MMM, conjunctiva non-erythematous ?Cardiovascular: regular rate and rhythm, no m/r/g ?Pulmonary/Chest: diffuse wheezing, able to speak in full sentences ?Abdominal: soft, non-tender, distended ?MSK: normal bulk and tone ?Neurological: alert & oriented x 3, answers questions appropriately when motivated to answer ?Skin: warm and dry, normal skin turgor, no heel breakdown, healing fistula on the left upper exxtremitiy ?Psych: closed off affect, not making eye contact regularly ? ?Filed Weights  ? 12/05/21 0825 12/05/21 1159 12/06/21 0500  ?Weight: 95.7 kg 94 kg 94 kg  ? ? ? ?Intake/Output Summary (Last 24 hours) at 12/06/2021 1352 ?Last data filed at 12/06/2021 1010 ?Gross per 24 hour  ?Intake 480 ml  ?Output 200 ml  ?Net 280 ml  ? ?Net IO Since Admission: -6,487.23 mL [12/06/21 1352] ? ?Pertinent Labs: ?CBC Latest Ref Rng & Units 12/05/2021 12/01/2021 11/30/2021  ?WBC 4.0 - 10.5 K/uL 15.1(H) 13.4(H) 10.6(H)  ?Hemoglobin 12.0 - 15.0 g/dL 7.5(L) 8.8(L) 7.4(L)  ?Hematocrit 36.0 - 46.0 % 24.9(L) 27.8(L) 24.6(L)  ?Platelets 150 - 400 K/uL 194 148(L) 242  ? ? ?CMP Latest Ref Rng & Units 12/06/2021 12/05/2021 12/04/2021   ?Glucose 70 - 99 mg/dL 162(H) 176(H) 226(H)  ?BUN 8 - 23 mg/dL 59(H) 108(H) 90(H)  ?Creatinine 0.44 - 1.00 mg/dL 3.11(H) 4.16(H) 3.45(H)  ?Sodium 135 - 145 mmol/L 134(L) 134(L) 133(L)  ?Potassium 3.5 - 5.1 mmol/L 4.6 4.6 4.1  ?Chloride 98 - 111 mmol/L 99 97(L) 97(L)  ?CO2 22 - 32 mmol/L 24 25 26   ?Calcium 8.9 - 10.3 mg/dL 9.4 9.6 9.2  ?Total Protein 6.5 - 8.1 g/dL - - -  ?Total Bilirubin 0.3 - 1.2 mg/dL - - -  ?Alkaline Phos 38 - 126 U/L - - -  ?AST 15 - 41 U/L - - -  ?ALT 0 - 44 U/L - - -  ? ? ?Imaging: ?No results found. ? ?Assessment/Plan:  ? ?Principal Problem: ?  ESRD on dialysis [facility]) ?Active Problems: ?  COPD exacerbation ([place]) ?  Syncope ?  CKD (chronic kidney disease) stage 5, GFR less than 15 ml/min (HCC) ?  Kidney lesion, native, left ?  Labored breathing ?  Acute respiratory failure with hypoxia ([place]) ?  Lung nodules ?  Advanced care planning/counseling discussion ?  Goals of care, counseling/discussion ?  Malnutrition of moderate degree ? ? ?Patient Summary: ?[name] [name] is a 71 y.o. with a pertinent PMH of DM2 and CKD stage 5, who presented with LOC and admitted for syncope now  ESRD on dialysis ? ?#CKD5 now ESRD on dialysis ?#Newly Dialysis Dependent ESRD ?She is s/p R [facility] 2/23 and AVG 2/24 and has been accepted at [facility] and will be initiated on TTS schedule once discharged. She is emotionally having difficulty dealing with dialysis but is medically stable and tolerating it well.  ?- Avoid nephrotoxic drugs and dose medications per GFR. ?- Continue Sevelamer for her hyperphosphatemia  ?- Strict I&Os, daily weights ?  ?#Deconditioning ?Patient is severely deconditioned and requires 2 people to assist her with transitioning from bed to her chair. Per social work patient has a SNF bed and will work on [employer] fast streamer today. ?decided to pursue SNF upon discharge.  ?- PT will need to evaluate patient. ?-TOC assisting with dispo planning. ?  ?#Acute Hypoxic Respiratory Failure 2/2 pulmonary  edema resolved;  Possible COPD exacerbation  ?Patient had been reporting shortness of breath and was found to have pulmonary edema on CXR. She improved with IV lasix and duonebs and steroids. Seemed a little wheezy on exam on 3/1, gave her a dose of albuterol.  ?- She has completed her steroid course. ?-- Continue breo (equivalent to home symbicort)  ?-- PRN duonebs  ?-  Mobilize  ?  ?  ?# RML nodule, likely post inflammatory, though malignancy not ruled out; ?-outpatient PET scan planned with PPCM ? ?#Acute on Chronic HFmrEF, resolved ?ECHO from admission showed mild worsening of EF to 40-45% in setting of worsening uremia.  Patient is on torsemide milligrams twice daily per EMR as well as metolazone 3 times a week. Her  ?- Home diuretics are being held, volume status stable on HD ?  ?#Chronic Pain, Chronic use of opioids ?She receives #120 tablets of percocet 10-325 a month and fills regularly. With her ESRD, she is no longer a candidate for percocet and was switched to oral dilaudid, however sister complained of hallucinations and increasing somnolence on dilaudid.  ?- COWS ?- Oral dilaudid decreased to 1mg  PRN for breakthrough pain ? ?#R Ulnar vein DVT ?#R cephalic vein thrombosis  ?#? Superficial cellulitis-resolved  ?- vascular US obtained showing superficial thrombosis of R cephalic as well as DVT in R ulnar vein. Superficial thrombus able to be managed with warm compresses. Low probability of embolizing. Blood Cx remain negative and patient completed a 5 d course of augmentin ?  ?#Constipation ?- Continue daily bowel regimen.  ?  ?#T2DM ?last A1c was 02/2021 was 6.8. This hospitalization she has required -Semglee 15u qd + 5u aspart WC and SSI.  ? -small doses 100mg  Gabapentin as needed for a maximum of 300mg  daily for neuropathy ? ?#Renal mass  ?Patient noted to have interval enlargement of L kidney exophytic density (now 3.2 cm) compared to 02/2021 imaging ?- patient underwent MRI imaging of this mass. She  was unable to receive IV contrast due to poor renal function, however, imaging was more consistent with hemorrhagic cyst with recommendation for follow up in 3-6 months.  ?  ?# Anemia of CKD/iron deficiency ?- no active bleeding ?-daily CBC ?- IV iron ? ?Scarlett Presto, MD ?Internal Medicine Resident PGY-1 ?Pager [phone] ?Please contact the on call pager after 5 pm and on weekends at [phone]. ? ?

## 2021-12-07 DIAGNOSIS — Z992 Dependence on renal dialysis: Secondary | ICD-10-CM | POA: Diagnosis not present

## 2021-12-07 DIAGNOSIS — N186 End stage renal disease: Secondary | ICD-10-CM | POA: Diagnosis not present

## 2021-12-07 DIAGNOSIS — R911 Solitary pulmonary nodule: Secondary | ICD-10-CM | POA: Diagnosis not present

## 2021-12-07 DIAGNOSIS — E1122 Type 2 diabetes mellitus with diabetic chronic kidney disease: Secondary | ICD-10-CM | POA: Diagnosis not present

## 2021-12-07 LAB — CBC
HCT: 24.7 % — ABNORMAL LOW (ref 36.0–46.0)
Hemoglobin: 7.7 g/dL — ABNORMAL LOW (ref 12.0–15.0)
MCH: 27.9 pg (ref 26.0–34.0)
MCHC: 31.2 g/dL (ref 30.0–36.0)
MCV: 89.5 fL (ref 80.0–100.0)
Platelets: 234 10*3/uL (ref 150–400)
RBC: 2.76 MIL/uL — ABNORMAL LOW (ref 3.87–5.11)
RDW: 19.6 % — ABNORMAL HIGH (ref 11.5–15.5)
WBC: 13.1 10*3/uL — ABNORMAL HIGH (ref 4.0–10.5)
nRBC: 0 % (ref 0.0–0.2)

## 2021-12-07 LAB — RENAL FUNCTION PANEL
Albumin: 2.6 g/dL — ABNORMAL LOW (ref 3.5–5.0)
Anion gap: 10 (ref 5–15)
BUN: 83 mg/dL — ABNORMAL HIGH (ref 8–23)
CO2: 25 mmol/L (ref 22–32)
Calcium: 10.1 mg/dL (ref 8.9–10.3)
Chloride: 99 mmol/L (ref 98–111)
Creatinine, Ser: 4.03 mg/dL — ABNORMAL HIGH (ref 0.44–1.00)
GFR, Estimated: 11 mL/min — ABNORMAL LOW (ref >60)
Glucose, Bld: 123 mg/dL — ABNORMAL HIGH (ref 70–99)
Phosphorus: 3.3 mg/dL (ref 2.5–4.6)
Potassium: 5.3 mmol/L — ABNORMAL HIGH (ref 3.5–5.1)
Sodium: 134 mmol/L — ABNORMAL LOW (ref 135–145)

## 2021-12-07 LAB — GLUCOSE, CAPILLARY
Glucose-Capillary: 126 mg/dL — ABNORMAL HIGH (ref 70–99)
Glucose-Capillary: 232 mg/dL — ABNORMAL HIGH (ref 70–99)
Glucose-Capillary: 242 mg/dL — ABNORMAL HIGH (ref 70–99)

## 2021-12-07 MED ORDER — HEPARIN SODIUM (PORCINE) 1000 UNIT/ML DIALYSIS
1000.0000 [IU] | INTRAMUSCULAR | Status: DC | PRN
  Administered 2021-12-07: 1000 [IU] via INTRAVENOUS_CENTRAL

## 2021-12-07 MED ORDER — ALTEPLASE 2 MG IJ SOLR: 2.0000 mg | Freq: Once | INTRAMUSCULAR | Status: DC | PRN

## 2021-12-07 MED ORDER — LIDOCAINE-PRILOCAINE 2.5-2.5 % EX CREA: 1.0000 "application " | TOPICAL_CREAM | CUTANEOUS | Status: DC | PRN

## 2021-12-07 MED ORDER — LIDOCAINE HCL (PF) 1 % IJ SOLN: 5.0000 mL | INTRAMUSCULAR | Status: DC | PRN

## 2021-12-07 MED ORDER — PENTAFLUOROPROP-TETRAFLUOROETH EX AERO: 1.0000 "application " | INHALATION_SPRAY | CUTANEOUS | Status: DC | PRN

## 2021-12-07 MED ORDER — ALBUTEROL SULFATE (2.5 MG/3ML) 0.083% IN NEBU
2.5000 mg | INHALATION_SOLUTION | Freq: Three times a day (TID) | RESPIRATORY_TRACT | Status: DC
  Administered 2021-12-07 – 2021-12-08 (×3): 2.5 mg via RESPIRATORY_TRACT
  Filled 2021-12-07 (×3): qty 3

## 2021-12-07 MED ORDER — SODIUM CHLORIDE 0.9 % IV SOLN: 100.0000 mL | INTRAVENOUS | Status: DC | PRN

## 2021-12-07 MED ORDER — ALBUTEROL SULFATE (2.5 MG/3ML) 0.083% IN NEBU
2.5000 mg | INHALATION_SOLUTION | Freq: Four times a day (QID) | RESPIRATORY_TRACT | Status: DC
  Administered 2021-12-07: 2.5 mg via RESPIRATORY_TRACT
  Filled 2021-12-07: qty 3

## 2021-12-07 NOTE — Procedures (Signed)
I was present at this dialysis session. I have reviewed the session itself and made appropriate changes.  ? ?TDC BFR 400.  Goal UF 2.5L.  2K. BPs stable.  Hb down a tad, holding ESA with concern for malignancy. ? ?For DC today post HD to SNF.  ? ?Filed Weights  ? 12/05/21 1159 12/06/21 0500 12/07/21 0806  ?Weight: 94 kg 94 kg 95.2 kg  ? ? ?Recent Labs  ?Lab 12/07/21 ?0422  ?NA 134*  ?K 5.3*  ?CL 99  ?CO2 25  ?GLUCOSE 123*  ?BUN 83*  ?CREATININE 4.03*  ?CALCIUM 10.1  ?PHOS 3.3  ? ? ?Recent Labs  ?Lab 12/01/21 ?1816 12/05/21 ?2876 12/07/21 ?0630  ?WBC 13.4* 15.1* 13.1*  ?HGB 8.8* 7.5* 7.7*  ?HCT 27.8* 24.9* 24.7*  ?MCV 85.3 89.6 89.5  ?PLT 148* 194 234  ? ? ?Scheduled Meds: ? sodium chloride   Intravenous Once  ? acetaminophen  1,000 mg Oral Q8H  ? albuterol  2.5 mg Nebulization QID  ? amLODipine  5 mg Oral Daily  ? carvedilol  12.5 mg Oral BID  ? Chlorhexidine Gluconate Cloth  6 each Topical Q0600  ? feeding supplement  237 mL Oral TID BM  ? fluticasone furoate-vilanterol  1 puff Inhalation Daily  ? heparin  5,000 Units Subcutaneous Q8H  ? insulin aspart  0-9 Units Subcutaneous TID WC  ? insulin aspart  5 Units Subcutaneous TID WC  ? insulin glargine-yfgn  20 Units Subcutaneous Daily  ? levothyroxine  200 mcg Oral Q0600  ? metoCLOPramide  5 mg Oral TID  ? multivitamin  1 tablet Oral QHS  ? pantoprazole  40 mg Oral QHS  ? senna  2 tablet Oral QHS  ? sevelamer carbonate  800 mg Oral TID WC  ? traZODone  50 mg Oral QHS  ? ?Continuous Infusions: ?PRN Meds:.bisacodyl, heparin sodium (porcine), HYDROmorphone, polyethylene glycol   ?Pearson Grippe  MD ?12/07/2021, 9:20 AM ?  ?

## 2021-12-07 NOTE — Progress Notes (Signed)
Pt's husband called and requested to speak to the Director or AD. Prentiss Bells, Agricultural consultant answered and spoke with him he stated that he has been trying to speak with an MD for days and no one has given him a call about updates or her treatment plan. RN paged Internal medicine and notified DD and AD. ?

## 2021-12-07 NOTE — Progress Notes (Signed)
Contacted internal medicine in regards to husband's request to be contacted by the doctor to provide him with an update. Received no response. Also reached out to internal medicine in regards to patients telemetry order as it has expired. Did not receive a response.  Also contacted internal medicine about patients blood pressure and asked about holding carvedilol and amlodipine. Did not receive a response, medication not given due to bp being 110/53.  ?

## 2021-12-07 NOTE — Progress Notes (Addendum)
? ? ?HD#14 ?Subjective:  ?Overnight Events: Patient was reportedly less arousable overnight. ABG was obtained which had normal pH ? ?Patient sleeping in dialysis chair. Does not appear to be in any pain but too sleepy to answer questions. ? ?Objective:  ?Vital signs in last 24 hours: ?Vitals:  ? 12/07/21 1143 12/07/21 1145 12/07/21 1200 12/07/21 1352  ?BP:   (!) 126/57 (!) 110/53  ?Pulse:  97 94   ?Resp:  15    ?Temp: 98.4 ?F (36.9 ?C)     ?TempSrc:      ?SpO2:  95% 97%   ?Weight:  92.6 kg    ?Height:      ? ?Supplemental O2: Nasal Cannula ?SpO2: 97 % ?O2 Flow Rate (L/min): 2 L/min ? ? ?Physical Exam:  ?Constitutional: somnolent obese woman in dialysis chair intermittently dosing off ?HEENT: normocephalic atraumatic, mucous membranes moist ?Cardiovascular: regular rate and rhythm, no m/r/g ?Pulmonary/Chest: normal work of breathing on 1L [place], decreased wheezing, mild wheezing present in the lung bases ?Abdominal: soft, non-tender, distended ?MSK: normal bulk and tone ?Neurological: arouses, to voice/touch, oriented only to self ?Skin: warm and dry, fistula with palpable thrill in the LUE, no erythema round healing incision ? ?Filed Weights  ? 12/06/21 0500 12/07/21 0806 12/07/21 1145  ?Weight: 94 kg 95.2 kg 92.6 kg  ? ? ? ?Intake/Output Summary (Last 24 hours) at 12/07/2021 1354 ?Last data filed at 12/07/2021 1145 ?Gross per 24 hour  ?Intake 360 ml  ?Output 2500 ml  ?Net -2140 ml  ? ?Net IO Since Admission: -8,627.23 mL [12/07/21 1354] ? ?Pertinent Labs: ?CBC Latest Ref Rng & Units 12/07/2021 12/05/2021 12/01/2021  ?WBC 4.0 - 10.5 K/uL 13.1(H) 15.1(H) 13.4(H)  ?Hemoglobin 12.0 - 15.0 g/dL 7.7(L) 7.5(L) 8.8(L)  ?Hematocrit 36.0 - 46.0 % 24.7(L) 24.9(L) 27.8(L)  ?Platelets 150 - 400 K/uL 234 194 148(L)  ? ? ?CMP Latest Ref Rng & Units 12/07/2021 12/06/2021 12/05/2021  ?Glucose 70 - 99 mg/dL 123(H) 162(H) 176(H)  ?BUN 8 - 23 mg/dL 83(H) 59(H) 108(H)  ?Creatinine 0.44 - 1.00 mg/dL 4.03(H) 3.11(H) 4.16(H)  ?Sodium 135 - 145 mmol/L  134(L) 134(L) 134(L)  ?Potassium 3.5 - 5.1 mmol/L 5.3(H) 4.6 4.6  ?Chloride 98 - 111 mmol/L 99 99 97(L)  ?CO2 22 - 32 mmol/L 25 24 25   ?Calcium 8.9 - 10.3 mg/dL 10.1 9.4 9.6  ?Total Protein 6.5 - 8.1 g/dL - - -  ?Total Bilirubin 0.3 - 1.2 mg/dL - - -  ?Alkaline Phos 38 - 126 U/L - - -  ?AST 15 - 41 U/L - - -  ?ALT 0 - 44 U/L - - -  ? ? ?Imaging: ?No results found. ? ?Assessment/Plan:  ? ?Principal Problem: ?  ESRD on dialysis [facility]) ?Active Problems: ?  COPD exacerbation ([place]) ?  Syncope ?  CKD (chronic kidney disease) stage 5, GFR less than 15 ml/min (HCC) ?  Kidney lesion, native, left ?  Labored breathing ?  Acute respiratory failure with hypoxia ([place]) ?  Lung nodules ?  Advanced care planning/counseling discussion ?  Goals of care, counseling/discussion ?  Malnutrition of moderate degree ?  Pressure injury of skin ? ? ?Patient Summary: ?[name] [name] is a 71 y.o. with a pertinent PMH of DM2 and CKD stage 5, who presented with LOC and admitted for syncope now ESRD on dialysis ? ?#Delirium vs Oversedation ?Patient was previously switched to dilaudid due to ESRD and not being able to take her chronic percocet. However patient is more somnolent  this morning and difficult to rouse. We will remove all of her centrally acting/sedating meds and see if she perks up or whether this AMS may be related to a hospital delirium ?- stop gabapentin, trazodone, and dilaudid ? ?#CKD5 now ESRD on dialysis ?#Newly Dialysis Dependent ESRD ?She is s/p R [facility] 2/23 and AVG 2/24 and has been accepted at [facility] and will be initiated on TTS schedule once discharged. She is emotionally having difficulty dealing with dialysis but is medically stable and tolerating it well.  ?- Avoid nephrotoxic drugs and dose medications per GFR. ?- Continue Sevelamer for her hyperphosphatemia  ?- Strict I&Os, daily weights ?  ?#Deconditioning ?Patient is severely deconditioned and requires 2 people to assist her with transitioning from bed to her  chair. Per social work patient has a SNF bed and will work on [employer] fast streamer today. ?decided to pursue SNF upon discharge. TOC assisting with dispo planning. ?  ?#Acute Hypoxic Respiratory Failure 2/2 pulmonary edema resolved;  Possible COPD exacerbation  ?Patient had been reporting shortness of breath and was found to have pulmonary edema on CXR. She improved with IV lasix and duonebs and steroids. Seemed a little wheezy on exam on 3/1, gave her a dose of albuterol.  ?- She has completed her steroid course. ?-- Continue breo (equivalent to home symbicort)  ?-- PRN duonebs  ?-  Mobilize  ?  ? # RML nodule, likely post inflammatory, though malignancy not ruled out; ?-outpatient PET scan planned with PPCM ?  ?#Acute on Chronic HFmrEF, resolved ?ECHO from admission showed mild worsening of EF to 40-45% in setting of worsening uremia.  Patient is on torsemide milligrams twice daily per EMR as well as metolazone 3 times a week. Her  ?- Home diuretics are being held, volume status stable on HD ?  ?#Chronic Pain, Chronic use of opioids ?She receives #120 tablets of percocet 10-325 a month and fills regularly. With her ESRD, she is no longer a candidate for percocet and was switched to oral dilaudid, however this was stopped on 3/2 ?- COWS ?- Oral dilaudid  removed 2/2 AMS ?  ?#R Ulnar vein DVT ?#R cephalic vein thrombosis  ?#? Superficial cellulitis-resolved  ?- vascular US obtained showing superficial thrombosis of R cephalic as well as DVT in R ulnar vein. Superficial thrombus able to be managed with warm compresses. Low probability of embolizing. Blood Cx remain negative and patient completed a 5 d course of augmentin ?  ?#Constipation ?- Continue daily bowel regimen.  ?  ?#T2DM ?last A1c was 02/2021 was 6.8. This hospitalization she has required -Semglee 15u qd + 5u aspart WC and SSI.  ? -small doses 100mg  Gabapentin as needed for a maximum of 300mg  daily for neuropathy ?  ?#Renal mass  ?Patient noted to have interval  enlargement of L kidney exophytic density (now 3.2 cm) compared to 02/2021 imaging ?- patient underwent MRI imaging of this mass. 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  ?  ?# Anemia of CKD/iron deficiency ?- no active bleeding ?-daily CBC ?- IV iron ? ?Scarlett Presto, MD ?Internal Medicine Resident PGY-1 ?Pager [phone] ?Please contact the on call pager after 5 pm and on weekends at [phone]. ? ?

## 2021-12-07 NOTE — Progress Notes (Signed)
Case discussed with Renal NP. Provider aware of clinic's need for orders and will send orders later today for Saturday. Will update clinic with d/c date once confirmed. Contacted CSW this am to request that snf be advised to please send a hoyer pad/sling under pt to HD so pt can be lifted from w/c to HD chair. CSW to provide info to snf. Spoke to RN at [facility] to make clinic aware pt for possible d/c tomorrow and that pt orders will be sent later today. Also confirmed that clinic has hoyer lift as well. Will assist as needed.  ? ?Melven Sartorius ?Renal Navigator ?[phone] ?

## 2021-12-07 NOTE — TOC Transition Note (Addendum)
Transition of Care (TOC) - CM/SW Discharge Note ? ? ?Patient Details  ?Name: [name] [name] ?MRN: [id number] ?Date of Birth: [birth year] ? ?Transition of Care (TOC) CM/SW Contact:  ?Emeterio Reeve, LCSW ?Phone Number: ?12/07/2021, 1:00 PM ? ? ?Clinical Narrative:    ? ?CSW following for medically readiness. Insurance Josem Kaufmann has been received. SW updated facility.  ? ? ?Final next level of care: [place] ?Barriers to Discharge: Barriers Resolved ? ? ?Patient Goals and CMS Choice ?Patient states their goals for this hospitalization and ongoing recovery are:: to return to home ?CMS Medicare.gov Compare Post Acute Care list provided to:: Patient ?Choice offered to / list presented to : Patient ? ?Discharge Placement ?  ?           ?Patient chooses bed at: [facility] ?Patient to be transferred to facility by: [facility] ?Name of family member notified: Husnabd and sisers are aware ?Patient and family notified of of transfer: 12/07/21 ? ?Discharge Plan and Services ?  ?Discharge Planning Services: CM Consult ?Post Acute Care Choice: Home Health          ?  ?DME Agency: NA ?  ?  ?  ?HH Arranged: RN ?  ?  ?  ?  ? ?Social Determinants of Health (SDOH) Interventions ?  ? ? ?Readmission Risk Interventions ?Readmission Risk Prevention Plan 01/14/2020  ?Transportation Screening Complete  ?Medication Review [employer]) Complete  ?PCP or Specialist appointment within 3-5 days of discharge Complete  ?[place] or Home Care Consult Complete  ?SW Recovery Care/Counseling Consult Complete  ?Palliative Care Screening Not Applicable  ?Skilled Nursing Facility Complete  ?Some recent data might be hidden  ? ?Emeterio Reeve, LCSW ?Clinical Social Worker ? ? ? ? ?

## 2021-12-07 NOTE — Discharge Summary (Signed)
Name: [name] [name] MRN: [id number] DOB: 03-03-1951 71 y.o. PCP: [name], MD  Date of Admission: 11/21/2021  1:40 PM Date of Discharge:   12/08/21 Attending Physician: Angelica Pou, MD  Discharge Diagnosis: 1. 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 Newly Dialysis Dependent ESRD 3. Deconditioning 4. Acute Hypoxic Respiratory Failure 2/2 pulmonary edema resolved;  Possible COPD exacerbation  5. 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 Acute on Chronic HFmrEF 7. Chronic Pain, Chronic use of opioids 8. R Ulnar vein DVT, R cephalic vein thrombosis , Superficial cellulitis-resolved 9. Constipation 10. T2DM 11. Renal mass  12. Anemia of CKD/iron deficiency 13. Altered mental status: resolved  Discharge Medications: Allergies as of 12/08/2021       Reactions   Nebivolol Swelling   Chest pain (reaction to Bystolic)   Ace Inhibitors Swelling, Other (See Comments)   Tongue swell   Morphine And Related Itching        Medication List     STOP taking these medications    hydrOXYzine 25 MG tablet Commonly known as: ATARAX   metolazone 5 MG tablet Commonly known as: ZAROXOLYN   mirabegron ER 50 MG Tb24 tablet Commonly known as: MYRBETRIQ   oxyCODONE-acetaminophen 10-325 MG tablet Commonly known as: PERCOCET   Potassium Chloride ER 20 MEQ Tbcr   torsemide 100 MG tablet Commonly known as: DEMADEX       TAKE these medications    albuterol (2.5 MG/3ML) 0.083% nebulizer solution Commonly known as: PROVENTIL Take 3 mLs (2.5 mg total) by nebulization every 6 (six) hours as needed for wheezing or shortness of breath.   allopurinol 100 MG tablet Commonly known as: ZYLOPRIM Take 1 tablet (100 mg total) by mouth daily.   B COMPLEX PO Take 1 mL by mouth daily. B Complex 1.7mg -20mg -2mg -1.2mg /ml sublingual liquid   budesonide-formoterol 160-4.5 MCG/ACT inhaler Commonly known as:  SYMBICORT Inhale 2 puffs into the lungs 3 (three) times a week.   carvedilol 12.5 MG tablet Commonly known as: COREG Take 1 tablet (12.5 mg total) by mouth 2 (two) times daily.   cinacalcet 30 MG tablet Commonly known as: SENSIPAR Take 1 tablet (30 mg total) by mouth daily.   clopidogrel 75 MG tablet Commonly known as: PLAVIX Take 75 mg by mouth daily.   diclofenac Sodium 1 % Gel Commonly known as: VOLTAREN Apply 1 application topically 4 (four) times daily as needed (pain).   insulin lispro protamine-lispro (75-25) 100 UNIT/ML Susp injection Commonly known as: HUMALOG 75/25 MIX Inject 60 Units into the skin in the morning and at bedtime.   levothyroxine 200 MCG tablet Commonly known as: SYNTHROID Take 1 tablet (200 mcg total) by mouth daily at 6 (six) AM.   lubiprostone 24 MCG capsule Commonly known as: AMITIZA Take 24 mcg by mouth 2 (two) times daily.   magnesium oxide 400 (241.3 Mg) MG tablet Commonly known as: MAG-OX Take 1 tablet (400 mg total) by mouth 2 (two) times daily. What changed: when to take this   melatonin 3 MG Tabs tablet Take 3 mg by mouth at bedtime.   multivitamin with minerals Tabs tablet Take 1 tablet by mouth daily.   pantoprazole 40 MG tablet Commonly known as: PROTONIX Take 1 tablet (40 mg total) by mouth 2 (two) times daily before a meal. What changed: when to take this   polyethylene glycol 17 g packet Commonly known as: MIRALAX / GLYCOLAX Take 17 g by mouth daily.  senna-docusate 8.6-50 MG tablet Commonly known as: Senokot-S Take 1 tablet by mouth at bedtime as needed for moderate constipation.   sevelamer carbonate 800 MG tablet Commonly known as: RENVELA Take 1 tablet (800 mg total) by mouth 3 (three) times daily with meals.   Trulicity 1.5 ZD/6.3OV Sopn Generic drug: Dulaglutide Inject 1.5 mg into the skin once a week. Monday   Trulicity 5.64 PP/2.9JJ Sopn Generic drug: Dulaglutide Inject 0.75 mg into the skin once a  week.               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  A walker will not resolve issue with performing activities of daily living. A wheelchair will allow patient to safely perform daily activities. Patient can safely propel the wheelchair in the home or has a caregiver who can provide assistance. Length of need 6 months . Accessories: elevating leg rests (ELRs), wheel locks, extensions and anti-tippers.   11/28/21 0739   11/28/21 0000  For home use only DME Eelevated commode seat        11/28/21 0739            Disposition and follow-up:   Ms.[name] [name] was discharged from [facility] in Stable condition.  At the hospital follow up visit please address:  1.  ESRD- see how patient is tolerating dialysis  2. Chronic pain- patient can no longer take percocet and dilaudid was too sedating in the hospital. Enquire about pain  3. Blood pressure- likely managed through dialysis but can adjust medications as needed  4. Lung mass- ensure follow up with PCCM for PET scan  5. Possible COPD- may benefit from PFTs  4. HFmrEF- adjust GDMT as needed  2.  Labs / imaging needed at time of follow-up: bmp  3.  Pending labs/ test needing follow-up: none  Follow-up Appointments:  Contact information for follow-up providers     [facility] [place] Go on 12/09/2021.   Why: Schedule is Tuesday/Thursday/Saturday with 11:45 chair time.  Patient will need to arrive at 11:15 on Saturday, March 4 to complete paperwork prior to first treatment. Contact information: [address] [place] [address] [phone]              Contact  information for after-discharge care     Destination     [facility] Preferred SNF .   Service: Skilled Nursing Contact information: [place] [facility] Course by problem list: 1. 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 She had been resistant to starting dialysis for a long time but finally consented to HD after developing uremic symptoms and ending up in the hospital. She is s/p R [facility] 2/23 and [facility] 2/24 and has been accepted at [facility] and will be initiated on TTS schedule once discharged. She is emotionally having difficulty dealing with dialysis but is  medically stable and tolerating it well.   2. Deconditioning Patient is severely deconditioned and requires 2 people to assist her with transitioning from bed to her chair. She worked with PT/OT while inpatient and decision was made to go to SNF to get stronger.  3. Acute Hypoxic Respiratory Failure 2/2 pulmonary edema resolved;  Possible COPD exacerbation  Patient had been reporting shortness of breath and was found to have pulmonary edema on CXR. She improved with IV lasix and duonebs, steroids, as well as initiation on dialysis. She was given breo and PRN duonebs.  5. 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 Initially noted on CT scan 2020. Due to its enlarging nature, nodule was felt to be malignant and further further evaluated with dedicated chest CT which demonstrated the LLL nodule had greatly enlarged from 2020 concerning for primary lung malignancy. Additionally, a new right middle lobe nodule seen on imaging, possible post-infectious/inflammatory was also observed and will require follow up to exclude metastasis. No biopsy was pursued  while inpatient and plan is for patient to follow with PCCM for outpatient PET scan.  6. Acute on Chronic HFmrEF ECHO from admission showed mild worsening of EF to 40-45% in setting of worsening uremia and renal function. She was diuresed initially during hospital stay with some improvement and then volume status was managed via HD.  7. Chronic Pain, Chronic use of opioids Patient previously took percocet 10-25 receiving 120 tablets a month with regular fills. However, with her new ESRD she is no longer a candidate for oral dilaudid due to metabolite build up.Gabapentin and dilaudid were stopped due to somnolence.  8. R Ulnar vein DVT, R cephalic vein thrombosis , Superficial cellulitis-resolved Vascular US obtained showing superficial thrombosis of R cephalic as well as DVT in R ulnar vein. Superficial thrombus able to be managed with warm compresses. Low probability of embolizing. Blood Cx remain negative and patient completed a 5 d course of augmentin  9. Constipation Miralax given PRn  10. T2DM Last A1c was 02/2021 was 6.8. This hospitalization she has required -Semglee 15u qd + 5u aspart WC and SSI. Gabapentin was discontinued as above  11. Renal mass  Patient noted to have interval enlargement of L kidney exophytic density (now 3.2 cm) compared to 02/2021 imaging. She underwent MRI imaging of this mass. 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   12. Anemia of CKD/iron deficiency No active bleeding, stable. Daily CBC obtained. Nephrology will manage need for EPO at dialysis in the future.  13. Altered mental status Centrally acting medications including opioids, gabapentin, trazadone, and hydroxyzine were discontinued and mental status returned to baseline.  Discharge subjective Patient is ready to leave the hospital. Says that it has been frustrating  having people care for her here and not always doing their jobs in a respectful way. She has a lot of family at home who she says are a handful when they worry about her. She is looking forward to getting to go to [place].  Discharge Exam:   BP (!) 147/64 (BP Location: Right Arm)    Pulse 95    Temp 98.2 F (36.8 C) (Oral)    Resp 18    Ht 5\' 3"  (1.6 m)    Wt 95.5 kg    SpO2  98%    BMI 37.30 kg/m  Discharge exam:  Constitutional: smiling obese female lying in bed with breakfast tray in front of her. HEENT: MMM, conjunctiva non-erythematous Cardiovascular: regular rate and rhythm, no m/r/g Pulmonary/Chest: minimal wheezing, able to speak in full sentences Abdominal: soft, non-tender, distended MSK: normal bulk and tone Neurological: alert & oriented x 3, answers questions appropriately Skin: warm and dry, normal skin turgor, no heel breakdown, healing fistula on the left upper extremity, well healing old wound with intact dressing on the left anterior shin. Psych: full affect  Pertinent Labs, Studies, and Procedures:   CT Abdomen Pelvis Wo Contrast  Result Date: 11/21/2021 CLINICAL DATA:  Acute generalized abdominal pain. EXAM: CT ABDOMEN AND PELVIS WITHOUT CONTRAST TECHNIQUE: Multidetector CT imaging of the abdomen and pelvis was performed following the standard protocol without IV contrast. RADIATION DOSE REDUCTION: This exam was performed according to the departmental dose-optimization program which includes automated exposure control, adjustment of the mA and/or kV according to patient size and/or use of iterative reconstruction technique. COMPARISON:  Feb 15, 2021. FINDINGS: Lower chest: 2 left lower lobe nodules noted on prior exam have significantly enlarged. The largest measures 2.4 x 1.8 cm. This is highly concerning for malignancy and PET scan is recommended for further evaluation. Hepatobiliary: Status post cholecystectomy. No biliary dilatation is noted. No definite hepatic abnormality  is noted. Pancreas: Unremarkable. No pancreatic ductal dilatation or surrounding inflammatory changes. Spleen: Normal in size without focal abnormality. Adrenals/Urinary Tract: Stable bilateral adrenal gland enlargement. Bilateral renal cysts are noted. Interval enlargement of 3.2 cm exophytic density arising from lower pole of left kidney concerning for neoplasm or malignancy. No hydronephrosis or renal obstruction is noted. Urinary bladder is unremarkable. Stomach/Bowel: The stomach appears normal. There is no evidence of bowel obstruction or inflammation. Stool seen throughout the colon. Vascular/Lymphatic: Aortic atherosclerosis. No enlarged abdominal or pelvic lymph nodes. Reproductive: Status post hysterectomy. No adnexal masses. Other: No abdominal wall hernia or abnormality. No abdominopelvic ascites. Musculoskeletal: Status post right total hip arthroplasty. No definite acute osseous abnormality is noted. IMPRESSION: Interval large mint of 3.2 cm exophytic soft tissue abnormality arising from lower pole of left kidney concerning for renal cell carcinoma. Further evaluation with MRI is recommended. Two left lower lobe nodules are noted which are significantly enlarged compared to prior exam, the largest measuring 2.4 cm. This is concerning for malignancy or metastatic disease. PET scan is recommended for further evaluation. Stable bilateral adrenal gland enlargement is noted. Aortic Atherosclerosis (ICD10-I70.0). Electronically Signed   By: Marijo Conception M.D.   On: 11/21/2021 15:20   DG Chest 2 View  Result Date: 11/21/2021 CLINICAL DATA:  A 71 year old female presents for evaluation of weakness, possible seizure. EXAM: CHEST - 2 VIEW COMPARISON:  Comparison made with July 04, 2021. FINDINGS: Exam limited by portable technique and patient body habitus. Heart size is enlarged and stable compared to previous imaging. Central pulmonary vascular engorgement without frank edema. Nodular density in the  LEFT lower chest measuring up to 2 cm. This is in the LEFT retrocardiac region. No lobar consolidation. No sign of pneumothorax or of pleural effusion. On limited assessment there is no acute skeletal process. IMPRESSION: 1. Nodular density in the LEFT retrocardiac region measuring up to 2 cm. Suggest chest CT for further evaluation. 2. Stable marked cardiomegaly with central pulmonary vascular engorgement but no frank edema. Electronically Signed   By: Zetta Bills M.D.   On: 11/21/2021 14:49   CT Head Wo Contrast  Result Date: 11/21/2021 CLINICAL DATA:  Head trauma.  Weakness.  Possible syncopal event. EXAM: CT HEAD WITHOUT CONTRAST TECHNIQUE: Contiguous axial images were obtained from the base of the skull through the vertex without intravenous contrast. RADIATION DOSE REDUCTION: This exam was performed according to the departmental dose-optimization program which includes automated exposure control, adjustment of the mA and/or kV according to patient size and/or use of iterative reconstruction technique. COMPARISON:  Head CT 03/16/2019 FINDINGS: Brain: There is no evidence of an acute infarct, intracranial hemorrhage, mass, midline shift, or extra-axial fluid collection. 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 Hypodensities in the cerebral white matter bilaterally are unchanged and nonspecific but compatible with mild chronic small vessel ischemic disease. Mild cerebral atrophy is within normal limits for age. Vascular: Calcified atherosclerosis at the skull base. Skull: No acute fracture or suspicious osseous lesion. Sinuses/Orbits: Visualized paranasal sinuses and mastoid air cells are clear. Bilateral cataract extraction. Other: None. IMPRESSION: 1. No evidence of acute intracranial abnormality. 2. Mild chronic small vessel ischemic disease. Interval chronic right basal ganglia infarct. Electronically Signed   By: Logan Bores M.D.   On: 11/21/2021 15:27   EEG adult  Result Date:  11/22/2021 Greta Doom, MD     11/22/2021  1:17 PM History: 71 yo F being evaluated for syncope. Sedation: None Technique: This EEG was acquired with electrodes placed according to the International 10-20 electrode system (including Fp1, Fp2, F3, F4, C3, C4, P3, P4, O1, O2, T3, T4, T5, T6, A1, A2, Fz, Cz, Pz). The following electrodes were missing or displaced: none. Background: There is a posterior dominant rhythm of 8-9 Hz which is poorly sustained. There are occasional bifrontally predominant discharges with triphasic morphology. There eis diffuse high voltage irregular delta and theta range activity throughout the study. Photic stimulation: Physiologic driving is not performed EEG Abnormalities: 1) Triphasic waves 2) generalized slow activity. Clinical Interpretation: This EEG is consistent with a generalized non-specific cerebral dysfunction(encephalopathy). There was no seizure or seizure predisposition recorded on this study. Please note that lack of epileptiform activity on EEG does not preclude the possibility of epilepsy. Roland Rack, MD [facility] [phone] If 7pm- 7am, please page neurology on call as listed in [place].   ECHOCARDIOGRAM COMPLETE  Result Date: 11/22/2021    ECHOCARDIOGRAM REPORT   Patient Name:   NATHA GUIN Date of Exam: 11/22/2021 Medical Rec #:  [id number]         Height:       67.0 in Accession #:    [id number]        Weight:       198.0 lb Date of Birth:  1951-02-11          BSA:          2.014 m Patient Age:    19 years          BP:           127/56 mmHg Patient Gender: F                 HR:           73 bpm. Exam Location:  Inpatient Procedure: 2D Echo, Cardiac Doppler and Color Doppler Indications:    I50.21 CHF  History:        Patient has prior history of Echocardiogram examinations, most                 recent 02/16/2021. CHF, TIA and COPD; Risk Factors:Hypertension,  Dyslipidemia and Diabetes. GERD.  Sonographer:    Beryle Beams Referring Phys: Koppel  1. Left ventricular ejection fraction, by estimation, is 40 to 45%. The left ventricle has mildly decreased function. The left ventricle demonstrates global hypokinesis. The left ventricular internal cavity size was mildly dilated. There is mild left ventricular hypertrophy. Left ventricular diastolic parameters are consistent with Grade I diastolic dysfunction (impaired relaxation).  2. Right ventricular systolic function is normal. The right ventricular size is normal. There is mildly elevated pulmonary artery systolic pressure.  3. Left atrial size was severely dilated.  4. Right atrial size was mildly dilated.  5. The mitral valve is normal in structure. Trivial mitral valve regurgitation. No evidence of mitral stenosis.  6. The aortic valve was not well visualized. There is mild calcification of the aortic valve. Aortic valve regurgitation is not visualized. Mild aortic valve stenosis. Aortic valve area, by VTI measures 1.32 cm. Aortic valve mean gradient measures 8.0 mmHg. Aortic valve Vmax measures 2.05 m/s.  7. The inferior vena cava is dilated in size with >50% respiratory variability, suggesting right atrial pressure of 8 mmHg. FINDINGS  Left Ventricle: Left ventricular ejection fraction, by estimation, is 40 to 45%. The left ventricle has mildly decreased function. The left ventricle demonstrates global hypokinesis. The left ventricular internal cavity size was mildly dilated. There is  mild left ventricular hypertrophy. Left ventricular diastolic parameters are consistent with Grade I diastolic dysfunction (impaired relaxation). Right Ventricle: The right ventricular size is normal. No increase in right ventricular wall thickness. Right ventricular systolic function is normal. There is mildly elevated pulmonary artery systolic pressure. The tricuspid regurgitant velocity is 3.00  m/s, and with an assumed right atrial pressure of 3 mmHg, the  estimated right ventricular systolic pressure is 79.3 mmHg. Left Atrium: Left atrial size was severely dilated. Right Atrium: Right atrial size was mildly dilated. Pericardium: There is no evidence of pericardial effusion. Mitral Valve: The mitral valve is normal in structure. There is mild thickening of the mitral valve leaflet(s). Trivial mitral valve regurgitation. No evidence of mitral valve stenosis. MV peak gradient, 35.9 mmHg. The mean mitral valve gradient is 25.5 mmHg. Tricuspid Valve: The tricuspid valve is normal in structure. Tricuspid valve regurgitation is trivial. No evidence of tricuspid stenosis. Aortic Valve: The aortic valve was not well visualized. There is mild calcification of the aortic valve. Aortic valve regurgitation is not visualized. Mild aortic stenosis is present. Aortic valve mean gradient measures 8.0 mmHg. Aortic valve peak gradient measures 16.8 mmHg. Aortic valve area, by VTI measures 1.32 cm. Pulmonic Valve: The pulmonic valve was normal in structure. Pulmonic valve regurgitation is not visualized. No evidence of pulmonic stenosis. Aorta: The aortic root is normal in size and structure. Venous: The inferior vena cava is dilated in size with greater than 50% respiratory variability, suggesting right atrial pressure of 8 mmHg. IAS/Shunts: No atrial level shunt detected by color flow Doppler.  LEFT VENTRICLE PLAX 2D LVIDd:         5.50 cm      Diastology LVIDs:         4.00 cm      LV e' medial:    8.81 cm/s LV PW:         1.40 cm      LV E/e' medial:  8.3 LV IVS:        1.00 cm      LV e' lateral:   7.51 cm/s LVOT diam:  2.00 cm      LV E/e' lateral: 9.7 LV SV:         51 LV SV Index:   25 LVOT Area:     3.14 cm  LV Volumes (MOD) LV vol d, MOD A2C: 133.0 ml LV vol d, MOD A4C: 105.0 ml LV vol s, MOD A2C: 75.3 ml LV vol s, MOD A4C: 58.5 ml LV SV MOD A2C:     57.7 ml LV SV MOD A4C:     105.0 ml LV SV MOD BP:      55.9 ml RIGHT VENTRICLE             IVC RV S prime:     12.70 cm/s   IVC diam: 2.00 cm TAPSE (M-mode): 1.9 cm LEFT ATRIUM             Index        RIGHT ATRIUM           Index LA diam:        3.80 cm 1.89 cm/m   RA Area:     13.70 cm LA Vol (A2C):   82.3 ml 40.87 ml/m  RA Volume:   33.90 ml  16.84 ml/m LA Vol (A4C):   51.8 ml 25.72 ml/m LA Biplane Vol: 69.1 ml 34.32 ml/m  AORTIC VALVE                     PULMONIC VALVE AV Area (Vmax):    1.07 cm      PV Vmax:       0.94 m/s AV Area (Vmean):   1.08 cm      PV Vmean:      66.000 cm/s AV Area (VTI):     1.32 cm      PV VTI:        0.178 m AV Vmax:           205.00 cm/s   PV Peak grad:  3.5 mmHg AV Vmean:          133.000 cm/s  PV Mean grad:  2.0 mmHg AV VTI:            0.383 m AV Peak Grad:      16.8 mmHg AV Mean Grad:      8.0 mmHg LVOT Vmax:         70.00 cm/s LVOT Vmean:        45.800 cm/s LVOT VTI:          0.161 m LVOT/AV VTI ratio: 0.42  AORTA Ao Root diam: 2.40 cm Ao Asc diam:  2.60 cm MITRAL VALVE                TRICUSPID VALVE MV Area (PHT): 1.70 cm     TR Peak grad:   36.0 mmHg MV Area VTI:   0.59 cm     TR Mean grad:   24.0 mmHg MV Peak grad:  35.9 mmHg    TR Vmax:        300.00 cm/s MV Mean grad:  25.5 mmHg    TR Vmean:       234.0 cm/s MV Vmax:       3.00 m/s MV Vmean:      240.5 cm/s   SHUNTS MV Decel Time: 447 msec     Systemic VTI:  0.16 m MV E velocity: 72.70 cm/s   Systemic Diam: 2.00 cm MV A velocity: 120.00 cm/s MV E/A ratio:  0.61 Quillian Quince  Bensimhon MD Electronically signed by Glori Bickers MD Signature Date/Time: 11/22/2021/6:25:03 PM    Final    VAS [name] LOWER EXTREMITY VENOUS (DVT)  Result Date: 11/22/2021  Lower Venous DVT Study Patient Name:  BENTLY MORATH  Date of Exam:   11/22/2021 Medical Rec #: [id number]          Accession #:    [id number] Date of Birth: December 27, 1950           Patient Gender: F Patient Age:   18 years Exam Location:  [facility] Procedure:      VAS [name] LOWER EXTREMITY VENOUS (DVT) Referring Phys: Dorian Pod  --------------------------------------------------------------------------------  Indications: Pain, swelling RT>LT.  Limitations: Body habitus, acoustic shadowing due to overlying arterial calcification, patient intolerance to probe pressure at some segments. Comparison Study: 07-01-2021 Multiple priors. Most recent prior bilateral lower                   extremity venous was negative for DVT. Performing Technologist: Darlin Coco RDMS, RVT  Examination Guidelines: A complete evaluation includes B-mode imaging, spectral Doppler, color Doppler, and power Doppler as needed of all accessible portions of each vessel. Bilateral testing is considered an integral part of a complete examination. Limited examinations for reoccurring indications may be performed as noted. The reflux portion of the exam is performed with the patient in reverse Trendelenburg.  +---------+---------------+---------+-----------+----------+-------------------+  RIGHT     Compressibility Phasicity Spontaneity Properties Thrombus Aging       +---------+---------------+---------+-----------+----------+-------------------+  CFV       Full            Yes       Yes                                         +---------+---------------+---------+-----------+----------+-------------------+  SFJ       Full                                                                  +---------+---------------+---------+-----------+----------+-------------------+  FV Prox   Full                                                                  +---------+---------------+---------+-----------+----------+-------------------+  FV Mid    Full                                                                  +---------+---------------+---------+-----------+----------+-------------------+  FV Distal Full                                                                  +---------+---------------+---------+-----------+----------+-------------------+  PFV       Full                                                                   +---------+---------------+---------+-----------+----------+-------------------+  POP       Full            Yes       Yes                                         +---------+---------------+---------+-----------+----------+-------------------+  PTV                       Yes       Yes                    Patent by color,                                                                 limited                                                                          visualization        +---------+---------------+---------+-----------+----------+-------------------+  PERO                      Yes       Yes                    Patent by color,                                                                 limited                                                                          visualization        +---------+---------------+---------+-----------+----------+-------------------+ Evidence of arterial disease noted on today's examination. Patient with history of PAD and abnormal ABI.  +----+---------------+---------+-----------+----------+--------------+  LEFT Compressibility Phasicity Spontaneity Properties Thrombus Aging  +----+---------------+---------+-----------+----------+--------------+  CFV  Full            Yes       Yes                                    +----+---------------+---------+-----------+----------+--------------+  Summary: RIGHT: - There is no evidence of deep vein thrombosis in the lower extremity. However, portions of this examination were limited- see technologist comments above.  - No cystic structure found in the popliteal fossa.  LEFT: - No evidence of common femoral vein obstruction.  *See table(s) above for measurements and observations. Electronically signed by Harold Barban MD on 11/22/2021 at 7:40:21 PM.    Final     CBC Latest Ref Rng & Units 12/07/2021 12/05/2021 12/01/2021  WBC 4.0 - 10.5 K/uL 13.1(H) 15.1(H) 13.4(H)  Hemoglobin 12.0 -  15.0 g/dL 7.7(L) 7.5(L) 8.8(L)  Hematocrit 36.0 - 46.0 % 24.7(L) 24.9(L) 27.8(L)  Platelets 150 - 400 K/uL 234 194 148(L)    BMP Latest Ref Rng & Units 12/07/2021 12/06/2021 12/05/2021  Glucose 70 - 99 mg/dL 123(H) 162(H) 176(H)  BUN 8 - 23 mg/dL 83(H) 59(H) 108(H)  Creatinine 0.44 - 1.00 mg/dL 4.03(H) 3.11(H) 4.16(H)  BUN/Creat Ratio 11 - 26 - - -  Sodium 135 - 145 mmol/L 134(L) 134(L) 134(L)  Potassium 3.5 - 5.1 mmol/L 5.3(H) 4.6 4.6  Chloride 98 - 111 mmol/L 99 99 97(L)  CO2 22 - 32 mmol/L 25 24 25   Calcium 8.9 - 10.3 mg/dL 10.1 9.4 9.6    Discharge Instructions: Discharge Instructions     Call MD for:  persistant dizziness or light-headedness   Complete by: As directed    Call MD for:  redness, tenderness, or signs of infection (pain, swelling, redness, odor or green/yellow discharge around incision site)   Complete by: As directed    Call MD for:  severe uncontrolled pain   Complete by: As directed    Call MD for:  temperature >100.4   Complete by: As directed    Diet - low sodium heart healthy   Complete by: As directed    For home use only DME Eelevated commode seat   Complete by: As directed    For home use only DME standard manual wheelchair with seat cushion   Complete by: As directed    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  A walker will not resolve issue with performing activities of daily living. A wheelchair will allow patient to safely perform daily activities. Patient can safely propel the wheelchair in the home or has a caregiver who can provide assistance. Length of need 6 months . Accessories: elevating leg rests (ELRs), wheel locks, extensions and anti-tippers.   Increase activity slowly   Complete by: As directed    No wound care   Complete by: As directed        Signed: Scarlett Presto, MD 12/08/2021, 10:25 AM   Pager: [phone]

## 2021-12-07 NOTE — Progress Notes (Signed)
OT Cancellation Note ? ?Patient Details ?Name: [name] [name] ?MRN: [id number] ?DOB: November 02, 1950 ? ? ?Cancelled Treatment:    Reason Eval/Treat Not Completed: Patient at procedure or test/ unavailable (Pt in HD.) ? ?[name] ?12/07/2021, 8:36 AM ?Nestor Lewandowsky, OTR/L ?Acute Rehabilitation Services ?Pager: [phone] ?Office: [phone]  ?

## 2021-12-08 LAB — RENAL FUNCTION PANEL
Albumin: 2.8 g/dL — ABNORMAL LOW (ref 3.5–5.0)
Anion gap: 9 (ref 5–15)
BUN: 57 mg/dL — ABNORMAL HIGH (ref 8–23)
CO2: 28 mmol/L (ref 22–32)
Calcium: 10.3 mg/dL (ref 8.9–10.3)
Chloride: 98 mmol/L (ref 98–111)
Creatinine, Ser: 3.32 mg/dL — ABNORMAL HIGH (ref 0.44–1.00)
GFR, Estimated: 14 mL/min — ABNORMAL LOW (ref >60)
Glucose, Bld: 172 mg/dL — ABNORMAL HIGH (ref 70–99)
Phosphorus: 2.9 mg/dL (ref 2.5–4.6)
Potassium: 4.7 mmol/L (ref 3.5–5.1)
Sodium: 135 mmol/L (ref 135–145)

## 2021-12-08 LAB — GLUCOSE, CAPILLARY
Glucose-Capillary: 134 mg/dL — ABNORMAL HIGH (ref 70–99)
Glucose-Capillary: 275 mg/dL — ABNORMAL HIGH (ref 70–99)

## 2021-12-08 MED ORDER — ALBUTEROL SULFATE (2.5 MG/3ML) 0.083% IN NEBU: 2.5000 mg | INHALATION_SOLUTION | Freq: Four times a day (QID) | RESPIRATORY_TRACT | 12 refills | Status: DC | PRN

## 2021-12-08 MED ORDER — SEVELAMER CARBONATE 800 MG PO TABS: 800.0000 mg | ORAL_TABLET | Freq: Three times a day (TID) | ORAL | 0 refills | Status: AC

## 2021-12-08 NOTE — Progress Notes (Signed)
Pt for d/c to snf today. Contacted [facility]. Spoke to [place], Therapist, sports and [place]. Clinic aware pt to d/c today and will start tomorrow. Clinic confirms that orders were received yesterday.  ? ?Melven Sartorius ?Renal Navigator ?[phone] ?

## 2021-12-08 NOTE — Progress Notes (Signed)
PT Cancellation Note ? ?Patient Details ?Name: [name] [name] ?MRN: [id number] ?DOB: 1951/09/30 ? ? ?Cancelled Treatment:    Reason Eval/Treat Not Completed: Patient declined, no reason specified;Other (comment) transport in room for pt d/c to SNF.  ? ? ?Betsey Holiday Bellatrix Devonshire ?12/08/2021, 2:25 PM ?

## 2021-12-08 NOTE — Progress Notes (Signed)
Patient discharged to SNF in stable condition.  

## 2021-12-08 NOTE — TOC Transition Note (Signed)
Transition of Care (TOC) - CM/SW Discharge Note ? ? ?Patient Details  ?Name: [name] [name] ?MRN: [id number] ?Date of Birth: November 27, 1950 ? ?Transition of Care (TOC) CM/SW Contact:  ?Emeterio Reeve, LCSW ?Phone Number: ?12/08/2021, 10:28 AM ? ? ?Clinical Narrative:    ?  ?Per MD patient ready for DC to [facility]. RN, patient, patient's family, and facility notified of DC. Discharge Summary and FL2 sent to facility. DC packet on chart. Insurance Josem Kaufmann has been received and pt is covid negative. Ambulance transport requested for patient.  ?  ?RN to call report to [phone] ? ?CSW will sign off for now as social work intervention is no longer needed. Please consult [name] again if new needs arise. ? ? ?Final next level of care: [place] ?Barriers to Discharge: Barriers Resolved ? ? ?Patient Goals and CMS Choice ?Patient states their goals for this hospitalization and ongoing recovery are:: to return to home ?CMS Medicare.gov Compare Post Acute Care list provided to:: Patient ?Choice offered to / list presented to : Patient ? ?Discharge Placement ?  ?           ?Patient chooses bed at: [facility] ?Patient to be transferred to facility by: [facility] ?Name of family member notified: Husnabd and sisers are aware ?Patient and family notified of of transfer: 12/07/21 ? ?Discharge Plan and Services ?  ?Discharge Planning Services: CM Consult ?Post Acute Care Choice: Home Health          ?  ?DME Agency: NA ?  ?  ?  ?HH Arranged: RN ?  ?  ?  ?  ? ?Social Determinants of Health (SDOH) Interventions ?  ? ? ?Readmission Risk Interventions ?Readmission Risk Prevention Plan 01/14/2020  ?Transportation Screening Complete  ?Medication Review Press photographer) Complete  ?PCP or Specialist appointment within 3-5 days of discharge Complete  ?[place] or Home Care Consult Complete  ?SW Recovery Care/Counseling Consult Complete  ?Palliative Care Screening Not Applicable  ?Skilled Nursing Facility Complete  ?Some recent data might be  hidden  ? ? ?Emeterio Reeve, LCSW ?Clinical Social Worker ? ? ? ?

## 2021-12-08 NOTE — Plan of Care (Signed)
  Problem: Education: Goal: Knowledge of General Education information will improve Description Including pain rating scale, medication(s)/side effects and non-pharmacologic comfort measures Outcome: Progressing   

## 2021-12-08 NOTE — Progress Notes (Signed)
Patient ID: [name] [name], female   DOB: 05-12-1951, 71 y.o.   MRN: [id number] ?S:  ?HD yesterday 2.5L UF ?For DC to SNF today ? ?O:BP (!) 147/64 (BP Location: Right Arm)   Pulse 95   Temp 98.2 ?F (36.8 ?C) (Oral)   Resp 18   Ht 5\' 3"  (1.6 m)   Wt 95.5 kg   SpO2 98%   BMI 37.30 kg/m?  ? ?Intake/Output Summary (Last 24 hours) at 12/08/2021 1115 ?Last data filed at 12/08/2021 1053 ?Gross per 24 hour  ?Intake 660 ml  ?Output 2500 ml  ?Net -1840 ml  ? ? ?Intake/Output: ?I/O last 3 completed shifts: ?In: 540 [P.O.:540] ?Out: 2500 [Other:2500] ? Intake/Output this shift: ? Total I/O ?In: 120 [P.O.:120] ?Out: -  ?Weight change:  ?Gen:NAD  ?CVS: RRR ?Resp: CTAB ?Abd:+Bs, soft, NT/ND ?Ext: trace edema of RLE, LUE AVG +T/B; TDC  ? ?Recent Labs  ?Lab 12/02/21 ?0602 12/03/21 ?0045 12/04/21 ?0130 12/05/21 ?0119 12/06/21 ?0134 12/07/21 ?0422  ?NA 136 136 133* 134* 134* 134*  ?K 4.3 3.8 4.1 4.6 4.6 5.3*  ?CL 99 99 97* 97* 99 99  ?CO2 24 25 26 25 24 25   ?GLUCOSE 126* 242* 226* 176* 162* 123*  ?BUN 54* 72* 90* 108* 59* 83*  ?CREATININE 2.01* 2.78* 3.45* 4.16* 3.11* 4.03*  ?ALBUMIN 3.1* 2.7* 2.8* 2.8* 2.6* 2.6*  ?CALCIUM 9.3 9.3 9.2 9.6 9.4 10.1  ?PHOS 3.7 3.6 3.9 3.6 2.1* 3.3  ? ? ?Liver Function Tests: ?Recent Labs  ?Lab 12/05/21 ?0119 12/06/21 ?0134 12/07/21 ?0422  ?ALBUMIN 2.8* 2.6* 2.6*  ? ? ?No results for input(s): LIPASE, AMYLASE in the last 168 hours. ?No results for input(s): AMMONIA in the last 168 hours. ?CBC: ?Recent Labs  ?Lab 12/01/21 ?1816 12/05/21 ?3149 12/07/21 ?0630  ?WBC 13.4* 15.1* 13.1*  ?HGB 8.8* 7.5* 7.7*  ?HCT 27.8* 24.9* 24.7*  ?MCV 85.3 89.6 89.5  ?PLT 148* 194 234  ? ? ?Cardiac Enzymes: ?No results for input(s): CKTOTAL, CKMB, CKMBINDEX, TROPONINI in the last 168 hours. ?CBG: ?Recent Labs  ?Lab 12/06/21 ?1947 12/07/21 ?1239 12/07/21 ?1822 12/07/21 ?2006 12/08/21 ?0750  ?GLUCAP 201* 126* 232* 242* 134*  ? ? ? ?Iron Studies: No results for input(s): IRON, TIBC, TRANSFERRIN, FERRITIN in the last 72  hours. ?Studies/Results: ?No results found. ? sodium chloride   Intravenous Once  ? acetaminophen  1,000 mg Oral Q8H  ? albuterol  2.5 mg Nebulization TID  ? carvedilol  12.5 mg Oral BID  ? Chlorhexidine Gluconate Cloth  6 each Topical Q0600  ? feeding supplement  237 mL Oral TID BM  ? fluticasone furoate-vilanterol  1 puff Inhalation Daily  ? heparin  5,000 Units Subcutaneous Q8H  ? insulin aspart  0-9 Units Subcutaneous TID WC  ? insulin aspart  5 Units Subcutaneous TID WC  ? insulin glargine-yfgn  20 Units Subcutaneous Daily  ? levothyroxine  200 mcg Oral Q0600  ? metoCLOPramide  5 mg Oral TID  ? multivitamin  1 tablet Oral QHS  ? pantoprazole  40 mg Oral QHS  ? senna  2 tablet Oral QHS  ? sevelamer carbonate  800 mg Oral TID WC  ? ? ?BMET ?   ?Component Value Date/Time  ? NA 134 (L) 12/07/2021 0422  ? NA 146 (H) 12/23/2013 1132  ? K 5.3 (H) 12/07/2021 0422  ? CL 99 12/07/2021 0422  ? CO2 25 12/07/2021 0422  ? GLUCOSE 123 (H) 12/07/2021 0422  ? BUN 83 (H) 12/07/2021 0422  ?  BUN 31 (H) 12/23/2013 1132  ? CREATININE 4.03 (H) 12/07/2021 0422  ? CREATININE 3.51 (HH) 10/17/2020 1234  ? CREATININE 1.32 (H) 06/29/2014 1100  ? CALCIUM 10.1 12/07/2021 0422  ? CALCIUM 9.1 09/07/2021 1036  ? GFRNONAA 11 (L) 12/07/2021 0422  ? GFRNONAA 13 (L) 10/17/2020 1234  ? GFRAA 15 (L) 04/22/2020 1129  ? ?CBC ?   ?Component Value Date/Time  ? WBC 13.1 (H) 12/07/2021 0630  ? RBC 2.76 (L) 12/07/2021 0630  ? HGB 7.7 (L) 12/07/2021 0630  ? HCT 24.7 (L) 12/07/2021 0630  ? HCT 24.6 (L) 10/17/2020 1235  ? PLT 234 12/07/2021 0630  ? MCV 89.5 12/07/2021 0630  ? MCH 27.9 12/07/2021 0630  ? MCHC 31.2 12/07/2021 0630  ? RDW 19.6 (H) 12/07/2021 0630  ? RDW 15.7 (H) 12/23/2013 1132  ? LYMPHSABS 0.5 (L) 11/26/2021 1147  ? LYMPHSABS 2.5 12/23/2013 1132  ? MONOABS 0.9 11/26/2021 1147  ? EOSABS 0.1 11/26/2021 1147  ? EOSABS 0.1 12/23/2013 1132  ? BASOSABS 0.0 11/26/2021 1147  ? BASOSABS 0.0 12/23/2013 1132  ? ? ?Assessment/Plan: ?  ?Progressive CKD stage  V now ESRD:  ?S/p TDC placement 2/23 followed by her first HD session.   ?Started 2/23 ?Ongoing THS: 2K, 400/600, 4h, 2-3L UF as able ?S/p LUE AVG 12/01/21 placement by Dr. Carlis Abbott.  ?CLIP complete [facility] [place] ?Congestive heart failure: EF around 40 to 28% with diastolic dysfunction.  Will manage with UF at HD ?Left lower Lung nodule - concerning for malignancy and PET scan was recommended.  CT scan of chest showed a new right middle lobe nodule as well concerning for mets.  Dr. Valeta Harms consulted and will arrange for PET scan before attempt at bronchoscopy and biopsy as an outpatient. ?COPD - longtime smoker.  Continue with nebs per primary.  Likely etiology of SOB.   ?Hyperphosphatemia: Sevelamer; now at goal ? Hypercalemia - resolved.  ?Anemia of CKD: Iron deficiency.  IV iron.  Monitor hemoglobin trend.  Hold off on ESA for now given possible lung cancer. 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  Follow-up 3 to 6 months. ?HTN: Amlod, Coreg 12.5 mg twice daily.  Held hydralazine for low BPs ?Right lower extremity edema: Predominantly on the right side.  Had PVL of the right leg on 2/15 that was negative for DVT.  Improved. ?Disposition - for SNF at DC today ? ?Rexene Agent, MD  ?[facility] ? ?

## 2021-12-08 NOTE — Progress Notes (Signed)
Pt has refused BP meds at hs, BP-118/56 HR 102. Also refusing lab work at this time, on call Dr. Vinetta Bergamo notified, no new orders noted.  ?

## 2021-12-11 NOTE — Telephone Encounter (Signed)
Pt was discharged from hospital on Friday.  Called today and got PET scheduled for 3/24.  She has dialysis on Tues, Thurs and Sat.  Will route to [facility] to schedule follow up with BI - not sure if should put in as new pt, hospital follow up or what type of visit.  Will need to call [facility] - [phone] - to let them know when appt is or let me know and I can call.   ?

## 2021-12-28 ENCOUNTER — Ambulatory Visit (HOSPITAL_COMMUNITY): Payer: Medicare Other

## 2021-12-29 ENCOUNTER — Other Ambulatory Visit: Payer: Self-pay

## 2021-12-29 ENCOUNTER — Ambulatory Visit (HOSPITAL_COMMUNITY)
Admission: RE | Admit: 2021-12-29 | Discharge: 2021-12-29 | Disposition: A | Discharge status: Home / Self Care | Payer: Medicare Other | Source: Ambulatory Visit | Attending: Pulmonary Disease | Admitting: Pulmonary Disease

## 2021-12-29 DIAGNOSIS — R911 Solitary pulmonary nodule: Secondary | ICD-10-CM | POA: Diagnosis present

## 2021-12-29 LAB — GLUCOSE, CAPILLARY: Glucose-Capillary: 129 mg/dL — ABNORMAL HIGH (ref 70–99)

## 2021-12-29 MED ORDER — FLUDEOXYGLUCOSE F - 18 (FDG) INJECTION
10.5000 | Freq: Once | INTRAVENOUS | Status: AC
  Administered 2021-12-29: 10.22 via INTRAVENOUS

## 2022-01-03 ENCOUNTER — Encounter: Payer: Self-pay | Admitting: Pulmonary Disease

## 2022-01-03 ENCOUNTER — Ambulatory Visit (INDEPENDENT_AMBULATORY_CARE_PROVIDER_SITE_OTHER): Payer: Medicare Other | Admitting: Pulmonary Disease

## 2022-01-03 ENCOUNTER — Other Ambulatory Visit: Payer: Self-pay

## 2022-01-03 VITALS — BP 122/60 | HR 83 | Temp 98.6°F | Ht 63.0 in | Wt 209.0 lb

## 2022-01-03 DIAGNOSIS — R911 Solitary pulmonary nodule: Secondary | ICD-10-CM | POA: Diagnosis not present

## 2022-01-03 DIAGNOSIS — R942 Abnormal results of pulmonary function studies: Secondary | ICD-10-CM | POA: Diagnosis not present

## 2022-01-03 DIAGNOSIS — Z87891 Personal history of nicotine dependence: Secondary | ICD-10-CM

## 2022-01-03 NOTE — H&P (View-Only) (Signed)
? ?Synopsis: Referred in March 2023 for lung nodule by [name], MD ? ?Subjective:  ? ?PATIENT ID: [name] [name] GENDER: female DOB: [birth year], MRN: [id number] ? ?Chief Complaint  ?Patient presents with  ? Follow-up  ?  F/u on pet scan.   ? ? 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  Previously followed left lower lobe pulmonary nodule that was first identified in 2020.  Recent had a hospital admission in February 2023.  Repeat CT scan of the chest at 2.8 x 1.8 cm left lower lobe pulmonary nodule concerning for primary lung cancer.  She does have continued weight loss.  She does not have hemoptysis.  I saw her in the hospital in consultation with recommendations for a PET scan.  Patient had the nuclear medicine PET scan after she was discharged from the hospital.  CT PET scan was completed on 12/29/2021 which revealed a hypermetabolic 3 cm left lower lobe mass with an SUV max of 11.2 concerning for malignancy. ? ? ?Past Medical History:  ?Diagnosis Date  ? Anemia   ? Arthritis   ? Asthma   ? Chronic diastolic (congestive) heart failure (HCC)   ? Chronic kidney disease   ? Chronic pain syndrome   ? Class 2 obesity due to excess calories with body mass index (BMI) of 36.0 to 36.9 in adult   ? COPD (chronic obstructive pulmonary disease) ([place])   ? Diabetes mellitus without complication ([place])   ? Full dentures   ? GERD (gastroesophageal reflux disease)   ? Gout   ? History of hiatal hernia   ? History of transfusion   ? Hx MRSA infection   ? abscess left groin  ? Hyperlipidemia   ? Hypertension   ? Hypothyroid   ? 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 Peripheral vascular disease ([place])   ? Sleep apnea   ? UNABLE TO TOLERATE C PAP  ? Stress incontinence   ? TIA (transient ischemic attack)   ?  X2 NO RESIDUAL PROBLEMS  ? Wears  glasses   ?  ? ?Family History  ?Problem Relation Age of Onset  ? Diabetes Brother   ? Cardiomyopathy Mother   ? Heart disease Mother   ? Cancer Father   ?  ? ?Past Surgical History:  ?Procedure Laterality Date  ? ABDOMINAL HYSTERECTOMY    ? AV FISTULA PLACEMENT Left 12/01/2021  ? Procedure: INSERTION OF LEFT ARM ARTERIOVENOUS (AV) GORE-TEX GRAFT;  Surgeon: Serafina Mitchell, MD;  Location: [place];  Service: Vascular;  Laterality: Left;  ? BACK SURGERY  2020  ? BASCILIC VEIN TRANSPOSITION Left 04/05/2020  ? Procedure: BASILIC VEIN TRANSPOSITION FIRST STAGE;  Surgeon: Angelia Mould, MD;  Location: [facility];  Service: Vascular;  Laterality: Left;  ? [place]  ? DUE TO POLYP  ? DILATION AND CURETTAGE OF UTERUS    ? ENTEROSCOPY N/A 07/10/2021  ? Procedure: ENTEROSCOPY;  Surgeon: Ronnette Juniper, MD;  Location: [place];  Service: Gastroenterology;  Laterality: N/A;  ? ESOPHAGOGASTRODUODENOSCOPY N/A 01/25/2021  ? Procedure: ESOPHAGOGASTRODUODENOSCOPY (EGD);  Surgeon: Clarene Essex, MD;  Location: [facility];  Service: Endoscopy;  Laterality: N/A;  ? FOREARM FRACTURE SURGERY    ? Left arm  ? GIVENS CAPSULE STUDY N/A 07/07/2021  ? Procedure: GIVENS CAPSULE STUDY;  Surgeon: Clarene Essex, MD;  Location: [facility]  ENDOSCOPY;  Service: Endoscopy;  Laterality: N/A;  ? HERNIA REPAIR    ? w/ mesh  ? HOT HEMOSTASIS N/A 07/10/2021  ? Procedure: HOT HEMOSTASIS (ARGON PLASMA COAGULATION/BICAP);  Surgeon: Ronnette Juniper, MD;  Location: [facility];  Service: Gastroenterology;  Laterality: N/A;  ? INCISION AND DRAINAGE ABSCESS Left 02/04/2013  ? Procedure: INCISION AND DRAINAGE LEFT BUTTOCK ABSCESS; INCISION AND DRAINAGE LEFT BREAST ABSCESS;  Surgeon: Harl Bowie, MD;  Location: [place];  Service: General;  Laterality: Left;  ? INCISION AND DRAINAGE ABSCESS N/A 02/12/2013  ? Procedure: INCISION AND DEBRIDEMENT BUTTOCK WOUND ;  Surgeon: [name]. Georgette Dover, MD;  Location: [place];  Service: General;  Laterality: N/A;  ? INCISION AND  DRAINAGE ABSCESS N/A 02/14/2013  ? Procedure: INCISION AND DRAINAGE/DRESSING CHANGE;  Surgeon: Harl Bowie, MD;  Location: [place];  Service: General;  Laterality: N/A;  ? INCISION AND DRAINAGE ABSCESS N/A 03/21/2015  ? Procedure: INCISION AND DRAINAGE PUBIC ABSCESS;  Surgeon: Excell Seltzer, MD;  Location: [facility];  Service: General;  Laterality: N/A;  ? INCISION AND DRAINAGE PERIRECTAL ABSCESS Left 02/10/2013  ? Procedure: IRRIGATION AND DEBRIDEMENT OF BUTTOCK/PERINEAL ABSCESS;  Surgeon: [name]. Georgette Dover, MD;  Location: [place];  Service: General;  Laterality: Left;  ? [place] N/A 02/16/2013  ? Procedure: IRRIGATION AND DEBRIDEMENT PERINEAL ABSCESS;  Surgeon: Zenovia Jarred, MD;  Location: [place];  Service: General;  Laterality: N/A;  ? IR FLUORO GUIDE CV LINE RIGHT  11/30/2021  ? IR US GUIDE VASC ACCESS RIGHT  11/30/2021  ? IRRIGATION AND DEBRIDEMENT ABSCESS N/A 02/06/2013  ? Procedure: IRRIGATION AND DEBRIDEMENT BUTTOCK ABSCESS AND DRESSING CHANGE;  Surgeon: Harl Bowie, MD;  Location: [place];  Service: General;  Laterality: N/A;  ? IRRIGATION AND DEBRIDEMENT ABSCESS Left 02/08/2013  ? Procedure: IRRIGATION AND DEBRIDEMENT ABSCESS/DRESSING CHANGE;  Surgeon: Gwenyth Ober, MD;  Location: [place];  Service: General;  Laterality: Left;  ? JOINT REPLACEMENT  2010 / 2012   ? LAPAROSCOPIC CHOLECYSTECTOMY    ? LEFT HEART CATHETERIZATION WITH CORONARY ANGIOGRAM N/A 07/02/2014  ? Procedure: LEFT HEART CATHETERIZATION WITH CORONARY ANGIOGRAM;  Surgeon: Lorretta Harp, MD;  Location: [facility] CATH LAB;  Service: Cardiovascular;  Laterality: N/A;  ? LOWER EXTREMITY ANGIOGRAM Right 04/20/2016  ? Procedure: Lower Extremity Angiogram;  Surgeon: Elam Dutch, MD;  Location: [place] CV LAB;  Service: Cardiovascular;  Laterality: Right;  ? MULTIPLE TOOTH EXTRACTIONS    ? PERIPHERAL VASCULAR CATHETERIZATION N/A 04/20/2016  ? Procedure: Abdominal Aortogram;  Surgeon: Elam Dutch, MD;  Location:  [place] CV LAB;  Service: Cardiovascular;  Laterality: N/A;  ? PERIPHERAL VASCULAR CATHETERIZATION Right 04/20/2016  ? Procedure: Peripheral Vascular Intervention;  Surgeon: Elam Dutch, MD;  Location: [place] CV LAB;  Service: Cardiovascular;  Laterality: Right;  [place]  ? RIGHT HEART CATH N/A 10/27/2018  ? Procedure: RIGHT HEART CATH;  Surgeon: Larey Dresser, MD;  Location: [place] CV LAB;  Service: Cardiovascular;  Laterality: N/A;  ? RIGHT HEART CATH N/A 03/20/2019  ? Procedure: RIGHT HEART CATH;  Surgeon: Larey Dresser, MD;  Location: [place] CV LAB;  Service: Cardiovascular;  Laterality: N/A;  ? THYROIDECTOMY    ? TOTAL HIP REVISION Right 08/11/2014  ? Procedure: RIGHT ACETABULAR REVISION;  Surgeon: Gearlean Alf, MD;  Location: [facility];  Service: Orthopedics;  Laterality: Right;  ? VASCULAR SURGERY    ? ? ?Social History  ? ?Socioeconomic History  ?  Marital status: Legally Separated  ?  Spouse name: Not on file  ? Number of children: 1  ? Years of education: college  ? Highest education level: Not on file  ?Occupational History  ?  Comment: Disabled  ?Tobacco Use  ? Smoking status: Former  ?  Packs/day: 0.25  ?  Years: 40.00  ?  Pack years: 10.00  ?  Types: Cigarettes  ? Smokeless tobacco: Former  ?Vaping Use  ? Vaping Use: Never used  ?Substance and Sexual Activity  ? Alcohol use: Yes  ?  Alcohol/week: 1.0 standard drink  ?  Types: 1 Standard drinks or equivalent per week  ?  Comment: OCC  ? Drug use: No  ? Sexual activity: Not Currently  ?Other Topics Concern  ? Not on file  ?Social History Narrative  ? ** Merged History Encounter **  ?    ? Patient is separated  and lives at home with her friend. Patient is disabled. ?Education one year of college ?Right handed ?Caffeine two cups daily. ? ?  ? ?Social Determinants of Health  ? ?Financial Resource Strain: Not on file  ?Food Insecurity: Not on file  ?Transportation Needs: Not on file  ?Physical Activity: Not on file  ?Stress: Not on  file  ?Social Connections: Not on file  ?Intimate Partner Violence: Not on file  ?  ? ?Allergies  ?Allergen Reactions  ? Nebivolol Swelling  ?  Chest pain (reaction to Bystolic)  ? Ace Inhibitors Swelling and Ot

## 2022-01-03 NOTE — Progress Notes (Signed)
? ?Synopsis: Referred in March 2023 for lung nodule by [name], MD ? ?Subjective:  ? ?PATIENT ID: [name] [name] GENDER: female DOB: [birth year], MRN: [id number] ? ?Chief Complaint  ?Patient presents with  ? Follow-up  ?  F/u on pet scan.   ? ? 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  Previously followed left lower lobe pulmonary nodule that was first identified in 2020.  Recent had a hospital admission in February 2023.  Repeat CT scan of the chest at 2.8 x 1.8 cm left lower lobe pulmonary nodule concerning for primary lung cancer.  She does have continued weight loss.  She does not have hemoptysis.  I saw her in the hospital in consultation with recommendations for a PET scan.  Patient had the nuclear medicine PET scan after she was discharged from the hospital.  CT PET scan was completed on 12/29/2021 which revealed a hypermetabolic 3 cm left lower lobe mass with an SUV max of 11.2 concerning for malignancy. ? ? ?Past Medical History:  ?Diagnosis Date  ? Anemia   ? Arthritis   ? Asthma   ? Chronic diastolic (congestive) heart failure (HCC)   ? Chronic kidney disease   ? Chronic pain syndrome   ? Class 2 obesity due to excess calories with body mass index (BMI) of 36.0 to 36.9 in adult   ? COPD (chronic obstructive pulmonary disease) ([place])   ? Diabetes mellitus without complication ([place])   ? Full dentures   ? GERD (gastroesophageal reflux disease)   ? Gout   ? History of hiatal hernia   ? History of transfusion   ? Hx MRSA infection   ? abscess left groin  ? Hyperlipidemia   ? Hypertension   ? Hypothyroid   ? 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 Peripheral vascular disease ([place])   ? Sleep apnea   ? UNABLE TO TOLERATE C PAP  ? Stress incontinence   ? TIA (transient ischemic attack)   ?  X2 NO RESIDUAL PROBLEMS  ? Wears  glasses   ?  ? ?Family History  ?Problem Relation Age of Onset  ? Diabetes Brother   ? Cardiomyopathy Mother   ? Heart disease Mother   ? Cancer Father   ?  ? ?Past Surgical History:  ?Procedure Laterality Date  ? ABDOMINAL HYSTERECTOMY    ? AV FISTULA PLACEMENT Left 12/01/2021  ? Procedure: INSERTION OF LEFT ARM ARTERIOVENOUS (AV) GORE-TEX GRAFT;  Surgeon: Serafina Mitchell, MD;  Location: [facility];  Service: Vascular;  Laterality: Left;  ? BACK SURGERY  2020  ? BASCILIC VEIN TRANSPOSITION Left 04/05/2020  ? Procedure: BASILIC VEIN TRANSPOSITION FIRST STAGE;  Surgeon: Angelia Mould, MD;  Location: [facility];  Service: Vascular;  Laterality: Left;  ? [place]  ? DUE TO POLYP  ? DILATION AND CURETTAGE OF UTERUS    ? ENTEROSCOPY N/A 07/10/2021  ? Procedure: ENTEROSCOPY;  Surgeon: Ronnette Juniper, MD;  Location: [facility];  Service: Gastroenterology;  Laterality: N/A;  ? ESOPHAGOGASTRODUODENOSCOPY N/A 01/25/2021  ? Procedure: ESOPHAGOGASTRODUODENOSCOPY (EGD);  Surgeon: Clarene Essex, MD;  Location: [facility];  Service: Endoscopy;  Laterality: N/A;  ? FOREARM FRACTURE SURGERY    ? Left arm  ? GIVENS CAPSULE STUDY N/A 07/07/2021  ? Procedure: GIVENS CAPSULE STUDY;  Surgeon: Clarene Essex, MD;  Location: [facility]  ENDOSCOPY;  Service: Endoscopy;  Laterality: N/A;  ? HERNIA REPAIR    ? w/ mesh  ? HOT HEMOSTASIS N/A 07/10/2021  ? Procedure: HOT HEMOSTASIS (ARGON PLASMA COAGULATION/BICAP);  Surgeon: Ronnette Juniper, MD;  Location: [facility];  Service: Gastroenterology;  Laterality: N/A;  ? INCISION AND DRAINAGE ABSCESS Left 02/04/2013  ? Procedure: INCISION AND DRAINAGE LEFT BUTTOCK ABSCESS; INCISION AND DRAINAGE LEFT BREAST ABSCESS;  Surgeon: Harl Bowie, MD;  Location: [facility];  Service: General;  Laterality: Left;  ? INCISION AND DRAINAGE ABSCESS N/A 02/12/2013  ? Procedure: INCISION AND DEBRIDEMENT BUTTOCK WOUND ;  Surgeon: [name]. Georgette Dover, MD;  Location: [facility];  Service: General;  Laterality: N/A;  ? INCISION AND  DRAINAGE ABSCESS N/A 02/14/2013  ? Procedure: INCISION AND DRAINAGE/DRESSING CHANGE;  Surgeon: Harl Bowie, MD;  Location: [facility];  Service: General;  Laterality: N/A;  ? INCISION AND DRAINAGE ABSCESS N/A 03/21/2015  ? Procedure: INCISION AND DRAINAGE PUBIC ABSCESS;  Surgeon: Excell Seltzer, MD;  Location: [facility];  Service: General;  Laterality: N/A;  ? INCISION AND DRAINAGE PERIRECTAL ABSCESS Left 02/10/2013  ? Procedure: IRRIGATION AND DEBRIDEMENT OF BUTTOCK/PERINEAL ABSCESS;  Surgeon: [name]. Georgette Dover, MD;  Location: [facility];  Service: General;  Laterality: Left;  ? [place] N/A 02/16/2013  ? Procedure: IRRIGATION AND DEBRIDEMENT PERINEAL ABSCESS;  Surgeon: Zenovia Jarred, MD;  Location: [facility];  Service: General;  Laterality: N/A;  ? IR FLUORO GUIDE CV LINE RIGHT  11/30/2021  ? IR US GUIDE VASC ACCESS RIGHT  11/30/2021  ? IRRIGATION AND DEBRIDEMENT ABSCESS N/A 02/06/2013  ? Procedure: IRRIGATION AND DEBRIDEMENT BUTTOCK ABSCESS AND DRESSING CHANGE;  Surgeon: Harl Bowie, MD;  Location: [facility];  Service: General;  Laterality: N/A;  ? IRRIGATION AND DEBRIDEMENT ABSCESS Left 02/08/2013  ? Procedure: IRRIGATION AND DEBRIDEMENT ABSCESS/DRESSING CHANGE;  Surgeon: Gwenyth Ober, MD;  Location: [facility];  Service: General;  Laterality: Left;  ? JOINT REPLACEMENT  2010 / 2012   ? LAPAROSCOPIC CHOLECYSTECTOMY    ? LEFT HEART CATHETERIZATION WITH CORONARY ANGIOGRAM N/A 07/02/2014  ? Procedure: LEFT HEART CATHETERIZATION WITH CORONARY ANGIOGRAM;  Surgeon: Lorretta Harp, MD;  Location: [facility] CATH LAB;  Service: Cardiovascular;  Laterality: N/A;  ? LOWER EXTREMITY ANGIOGRAM Right 04/20/2016  ? Procedure: Lower Extremity Angiogram;  Surgeon: Elam Dutch, MD;  Location: [facility] CV LAB;  Service: Cardiovascular;  Laterality: Right;  ? MULTIPLE TOOTH EXTRACTIONS    ? PERIPHERAL VASCULAR CATHETERIZATION N/A 04/20/2016  ? Procedure: Abdominal Aortogram;  Surgeon: Elam Dutch, MD;  Location:  [facility] CV LAB;  Service: Cardiovascular;  Laterality: N/A;  ? PERIPHERAL VASCULAR CATHETERIZATION Right 04/20/2016  ? Procedure: Peripheral Vascular Intervention;  Surgeon: Elam Dutch, MD;  Location: [facility] CV LAB;  Service: Cardiovascular;  Laterality: Right;  popiteal  ? RIGHT HEART CATH N/A 10/27/2018  ? Procedure: RIGHT HEART CATH;  Surgeon: Larey Dresser, MD;  Location: [facility] CV LAB;  Service: Cardiovascular;  Laterality: N/A;  ? RIGHT HEART CATH N/A 03/20/2019  ? Procedure: RIGHT HEART CATH;  Surgeon: Larey Dresser, MD;  Location: [facility];  Service: Cardiovascular;  Laterality: N/A;  ? THYROIDECTOMY    ? TOTAL HIP REVISION Right 08/11/2014  ? Procedure: RIGHT ACETABULAR REVISION;  Surgeon: Gearlean Alf, MD;  Location: [facility];  Service: Orthopedics;  Laterality: Right;  ? VASCULAR SURGERY    ? ? ?Social History  ? ?Socioeconomic History  ?  Marital status: Legally Separated  ?  Spouse name: Not on file  ? Number of children: 1  ? Years of education: college  ? Highest education level: Not on file  ?Occupational History  ?  Comment: Disabled  ?Tobacco Use  ? Smoking status: Former  ?  Packs/day: 0.25  ?  Years: 40.00  ?  Pack years: 10.00  ?  Types: Cigarettes  ? Smokeless tobacco: Former  ?Vaping Use  ? Vaping Use: Never used  ?Substance and Sexual Activity  ? Alcohol use: Yes  ?  Alcohol/week: 1.0 standard drink  ?  Types: 1 Standard drinks or equivalent per week  ?  Comment: OCC  ? Drug use: No  ? Sexual activity: Not Currently  ?Other Topics Concern  ? Not on file  ?Social History Narrative  ? ** Merged History Encounter **  ?    ? Patient is separated  and lives at home with her friend. Patient is disabled. ?Education one year of college ?Right handed ?Caffeine two cups daily. ? ?  ? ?Social Determinants of Health  ? ?Financial Resource Strain: Not on file  ?Food Insecurity: Not on file  ?Transportation Needs: Not on file  ?Physical Activity: Not on file  ?Stress: Not on  file  ?Social Connections: Not on file  ?Intimate Partner Violence: Not on file  ?  ? ?Allergies  ?Allergen Reactions  ? Nebivolol Swelling  ?  Chest pain (reaction to Bystolic)  ? Ace Inhibitors Swelling and Ot

## 2022-01-03 NOTE — Patient Instructions (Signed)
Thank you for visiting Dr. Valeta Harms at [facility]. ?Today we recommend the following: ? ?Orders Placed This Encounter  ?Procedures  ? Procedural/ Surgical Case Request: ROBOTIC ASSISTED NAVIGATIONAL BRONCHOSCOPY  ? Ambulatory referral to Pulmonology  ? ?Bronchoscopy on 01/23/2022 ? ?Return in about 27 days (around 01/30/2022) for with Eric Form, NP. ? ? ? ?Please do your part to reduce the spread of COVID-19.  ? ?

## 2022-01-08 ENCOUNTER — Telehealth: Payer: Self-pay | Admitting: Pulmonary Disease

## 2022-01-09 NOTE — Telephone Encounter (Signed)
[name] [name] - will you see if this has been rescheduled w/ [facility]?   ? ?

## 2022-01-10 NOTE — Telephone Encounter (Signed)
Update per [name] via secure chat: ? ?[facility] has her penciled in for HD 4/17 to open up 4/18.  The MD of that unit Otelia Santee has been informed as well since I don't follow her now that she's on dialysis at that unit.  Thanks! ? ?Will await the pt and ?

## 2022-01-10 NOTE — Telephone Encounter (Signed)
Leda Gauze from [facility] has called to reschedule bronch due to pt has dialysis on Tuesday's.  When would you like for me to reschedule bronch? ?

## 2022-01-10 NOTE — Telephone Encounter (Signed)
Dr Valeta Harms sent secure chat to [name] with Nephrology and she says will ask that the HD team get the [name] [name] set up for HD 4/17. Will hold to ensure this happens. ?

## 2022-01-10 NOTE — Telephone Encounter (Signed)
I called [name] [name] from [facility] for her. Called pt and there was also no answer- LMTCB. Called [facility]-  [name] [name] with [name]- had to leave her a msg as well. Will see if she can help coordinate HD on a Monday 4/17 so that the pt can proceed with Bronch on Tues 4/18 ?

## 2022-01-10 NOTE — Telephone Encounter (Signed)
I called [name] [name] at [facility] left vm for her to call me back.   ?

## 2022-01-11 NOTE — Telephone Encounter (Signed)
Called the pt and there was no answer- LMTCB    

## 2022-01-11 NOTE — Telephone Encounter (Signed)
Magdalen Spatz, NP  Garner Nash, DO; Rosana Berger, CMA ?Caller: Unspecified (3 days ago,  2:20 PM) ?Sounds like she has been scheduled for 4/17 for HD.  ?Magda Paganini does she have transportation to the [facility] on 4/18?  ?Dr. Valeta Harms are all the bronch orders placed and is the room reserved in Endo?   ?  ? ? ?Spoke with the [facility] with [facility]. She states that they are aware of the transportation needed to have HD 4/17 and Bronch on 4/18. Nothing further needed.  ?

## 2022-01-12 ENCOUNTER — Encounter (HOSPITAL_COMMUNITY): Payer: Self-pay

## 2022-01-18 ENCOUNTER — Telehealth: Payer: Self-pay | Admitting: Pulmonary Disease

## 2022-01-18 NOTE — Telephone Encounter (Signed)
Called and spoke with [facility]. She had originally called to state that they would not be able to get the patient scheduled for dialysis on 04/17 has planned. By the time I called her, another patient had fallen off of the schedule. She was able to get the patient scheduled on 04/17. The bronch can still happen on 04/18. ? ?Nothing further needed at time of call.  ? ?

## 2022-01-19 ENCOUNTER — Other Ambulatory Visit: Payer: Self-pay

## 2022-01-19 ENCOUNTER — Encounter (HOSPITAL_COMMUNITY): Payer: Self-pay | Admitting: Pulmonary Disease

## 2022-01-19 ENCOUNTER — Other Ambulatory Visit: Payer: Self-pay | Admitting: Pulmonary Disease

## 2022-01-19 LAB — SARS CORONAVIRUS 2 (TAT 6-24 HRS): SARS Coronavirus 2: NEGATIVE

## 2022-01-19 NOTE — Progress Notes (Signed)
TWO VISITORS ARE ALLOWED TO COME WITH YOU AND STAY IN THE SURGICAL WAITING ROOM ONLY DURING PRE OP AND PROCEDURE DAY OF SURGERY.  ? ?PCP - [facility]  ?Cardiologist - none, used Dr Gwenlyn Found in past, last OV 08/04/19 and last Video Visit   ? ?Chest x-ray - 11/26/21 (1V) ?EKG - 11/28/21 ?Stress Test - 06/29/14 ?ECHO - 11/22/21 ?Cardiac Cath - 03/20/19 ? ?ICD Pacemaker/Loop - n/a ? ?Sleep Study -  Yes ?CPAP - does not use CPAP ? ?THE NIGHT BEFORE SURGERY, take 14 Units of  Humalog 75/25 mix. ?     ?THE MORNING OF SURGERY, take 7 units of Humalog 75/25 mix. ? ?If your blood sugar is less than 70 mg/dL, you will need to treat for low blood sugar: ?Treat a low blood sugar (less than 70 mg/dL) with ? cup of clear juice (cranberry or apple), 4 glucose tablets, OR glucose gel. ?Recheck blood sugar in 15 minutes after treatment (to make sure it is greater than 70 mg/dL). If your blood sugar is not greater than 70 mg/dL on recheck, call [phone] for further instructions. ? ?Blood Thinner Instructions:  Last dose of Plavix was the first part of April 2023.   ?  ?Anesthesia review: Yes ? ?STOP now taking any Aspirin (unless otherwise instructed by your surgeon), Aleve, Naproxen, Ibuprofen, Motrin, Advil, Goody's, BC's, all herbal medications, fish oil, and all vitamins.  ? ?Coronavirus Screening ?Covid test on 01/19/22. ?Do you have any of the following symptoms:  ?Cough yes/no: No ?Fever (>100.25F)  yes/no: No ?Runny nose yes/no: No ?Sore throat yes/no: No ?Difficulty breathing/shortness of breath  yes/no: No ? ?Have you traveled in the last 14 days and where? yes/no: No ? ?Patient verbalized understanding of instructions that were given via phone. ?

## 2022-01-22 NOTE — Progress Notes (Signed)
Anesthesia Chart Review: ?Same day workup ? ?Recent admission 2/14 through 12/08/2021 for CKD 5 with progression to ESRD on dialysis ([facility]), acute hypoxic respiratory failure secondary to pulmonary edema and COPD exacerbation, lung mass on chest CT (Increased in size from 8 mm x 12 mm in 2020 to 2.8 x 1.8 cm this admission).  Pulmonology was consulted and recommended PET scan with outpatient follow-up.  PET scan 12/29/2021 showed hypermetabolic 3 cm left lower lobe mass with an SUV max of 11.2 concerning for malignancy. ? ?History of mildly reduced EF 40 to 45% with normal coronaries by cath 2015.  Echo 11/22/2021 during recent admission showed EF 40 to 45%, global hypokinesis, grade 1 DD, mild AS. ? ?OSA, intolerant to CPAP. ? ?IDDM 2, last A1c 6.3 on 11/22/2021. ? ?Patient will need day of surgery labs and evaluation. ? ?EKG 11/28/2021: Sinus rhythm with occasional Premature ventricular complexes and Premature atrial complexes.  Rate 91. Left ventricular hypertrophy with QRS widening and repolarization abnormality ( Cornell product ) ? ?CT chest 11/28/2021: ?IMPRESSION: ?1. 2.8 x 1.8 cm left lower lobe nodule which has greatly enlarged ?from 2020 and is most concerning for primary lung malignancy. ?2. New 5 mm right middle lobe nodule, indeterminate though ?potentially post-infectious/inflammatory. Attention on follow-up to ?exclude a metastasis. ?3. Trace right pleural effusion. ?4. Aortic Atherosclerosis (ICD10-I70.0). ? ?TTE 11/22/2021: ? 1. Left ventricular ejection fraction, by estimation, is 40 to 45%. The  ?left ventricle has mildly decreased function. The left ventricle  ?demonstrates global hypokinesis. The left ventricular internal cavity size  ?was mildly dilated. There is mild left  ?ventricular hypertrophy. Left ventricular diastolic parameters are  ?consistent with Grade I diastolic dysfunction (impaired relaxation).  ? 2. Right ventricular systolic function is normal. The right ventricular  ?size is  normal. There is mildly elevated pulmonary artery systolic  ?pressure.  ? 3. Left atrial size was severely dilated.  ? 4. Right atrial size was mildly dilated.  ? 5. The mitral valve is normal in structure. Trivial mitral valve  ?regurgitation. No evidence of mitral stenosis.  ? 6. The aortic valve was not well visualized. There is mild calcification  ?of the aortic valve. Aortic valve regurgitation is not visualized. Mild  ?aortic valve stenosis. Aortic valve area, by VTI measures 1.32 cm?Marland Kitchen Aortic  ?valve mean gradient measures 8.0  ?mmHg. Aortic valve Vmax measures 2.05 m/s.  ? 7. The inferior vena cava is dilated in size with >50% respiratory  ?variability, suggesting right atrial pressure of 8 mmHg.  ? ?Cath 07/02/14: ?ANGIOGRAPHIC RESULTS:  ?1. Left main; normal  ?2. LAD; normal ?3. Left circumflex; left dominant and normal.  ?4. Right coronary artery; nondominant and normal ?5. Left ventriculography; RAO left ventriculogram was performed using  ?25 mL of Visipaque dye at 12 mL/second. The overall LVEF estimated  ?40-45 % Without wall motion abnormalities ?  ?IMPRESSION:Ms. Troutman has normal coronary arteries and mild LV dysfunction. She is left dominant. I believe her Myoview was false positive because of her body habitus. She'll be cleared for her upcoming hip surgery at low cardiovascular risk. The sheath was removed and a TR Band was placed on the right wrist to achieve patent hemostasis. The patient left the lab in stable condition. She'll be hydrated for 2 hours and discharged home. ? ? ? ? ?Karoline Caldwell, PA-C ?[facility]/Anesthesiology ?Phone [phone] ?01/22/2022 11:03 AM ? ?

## 2022-01-22 NOTE — Anesthesia Preprocedure Evaluation (Addendum)
Anesthesia Evaluation  ?Patient identified by MRN, date of birth, ID band ?Patient awake ? ? ? ?Reviewed: ?Allergy & Precautions, NPO status , Patient's Chart, lab work & pertinent test results ? ?Airway ?Mallampati: I ? ?TM Distance: >3 FB ?Neck ROM: Full ? ? ? Dental ? ?(+) Edentulous Upper, Dental Advisory Given ?  ?Pulmonary ?asthma , sleep apnea (no CPAP) , COPD,  COPD inhaler, former smoker,  ?  ?Pulmonary exam normal ?breath sounds clear to auscultation ? ? ? ? ? ? Cardiovascular ?hypertension, Pt. on medications and Pt. on home beta blockers ?+ Peripheral Vascular Disease and +CHF  ?Normal cardiovascular exam ?Rhythm:Regular Rate:Normal ? ?TTE 11/22/2021: ??1. Left ventricular ejection fraction, by estimation, is 40 to 45%. The  ?left ventricle has mildly decreased function. The left ventricle  ?demonstrates global hypokinesis. The left ventricular internal cavity size  ?was mildly dilated. There is mild left  ?ventricular hypertrophy. Left ventricular diastolic parameters are  ?consistent with Grade I diastolic dysfunction (impaired relaxation).  ??2. Right ventricular systolic function is normal. The right ventricular  ?size is normal. There is mildly elevated pulmonary artery systolic  ?pressure.  ??3. Left atrial size was severely dilated.  ??4. Right atrial size was mildly dilated.  ??5. The mitral valve is normal in structure. Trivial mitral valve  ?regurgitation. No evidence of mitral stenosis.  ??6. The aortic valve was not well visualized. There is mild calcification  ?of the aortic valve. Aortic valve regurgitation is not visualized. Mild  ?aortic valve stenosis. Aortic valve area, by VTI measures 1.32 cm?Marland Kitchen Aortic  ?valve mean gradient measures 8.0  ?mmHg. Aortic valve Vmax measures 2.05 m/s.  ??7. The inferior vena cava is dilated in size with >50% respiratory  ?variability, suggesting right atrial pressure of 8 mmHg.  ?? ?  ?Neuro/Psych ?TIAnegative psych ROS  ?  GI/Hepatic ?Neg liver ROS, hiatal hernia, GERD  ,  ?Endo/Other  ?diabetes, Insulin DependentHypothyroidism  ? Renal/GU ?ESRF and DialysisRenal disease (dialysis TThS)  ?negative genitourinary ?  ?Musculoskeletal ? ?(+) Arthritis ,  ? Abdominal ?  ?Peds ? Hematology ?negative hematology ROS ?(+)   ?Anesthesia Other Findings ? ? Reproductive/Obstetrics ? ?  ? ? ? ? ? ? ? ? ? ? ? ? ? ?  ?  ? ? ? ? ? ? ?Anesthesia Physical ?Anesthesia Plan ? ?ASA: 3 ? ?Anesthesia Plan: General  ? ?Post-op Pain Management: Minimal or no pain anticipated  ? ?Induction: Intravenous ? ?PONV Risk Score and Plan: Dexamethasone, Ondansetron and Treatment may vary due to age or medical condition ? ?Airway Management Planned: Oral ETT ? ?Additional Equipment:  ? ?Intra-op Plan:  ? ?Post-operative Plan: Extubation in OR ? ?Informed Consent: I have reviewed the patients History and Physical, chart, labs and discussed the procedure including the risks, benefits and alternatives for the proposed anesthesia with the patient or authorized representative who has indicated his/her understanding and acceptance.  ? ? ? ?Dental advisory given ? ?Plan Discussed with: CRNA ? ?Anesthesia Plan Comments: ( ?)  ? ? ? ? ? ?Anesthesia Quick Evaluation ? ?

## 2022-01-23 ENCOUNTER — Ambulatory Visit (HOSPITAL_COMMUNITY): Payer: Medicare (Managed Care) | Admitting: Physician Assistant

## 2022-01-23 ENCOUNTER — Ambulatory Visit (HOSPITAL_COMMUNITY): Payer: Medicare (Managed Care)

## 2022-01-23 ENCOUNTER — Encounter (HOSPITAL_COMMUNITY): Payer: Self-pay | Admitting: Pulmonary Disease

## 2022-01-23 ENCOUNTER — Ambulatory Visit (HOSPITAL_COMMUNITY)
Admission: RE | Admit: 2022-01-23 | Discharge: 2022-01-23 | Disposition: A | Discharge status: Home / Self Care | Payer: Medicare (Managed Care) | Attending: Pulmonary Disease | Admitting: Pulmonary Disease

## 2022-01-23 ENCOUNTER — Encounter (HOSPITAL_COMMUNITY)
Admission: RE | Disposition: A | Discharge status: Home / Self Care | Payer: Self-pay | Source: Home / Self Care | Attending: Pulmonary Disease

## 2022-01-23 ENCOUNTER — Ambulatory Visit (HOSPITAL_BASED_OUTPATIENT_CLINIC_OR_DEPARTMENT_OTHER): Payer: Medicare (Managed Care) | Admitting: Physician Assistant

## 2022-01-23 DIAGNOSIS — E785 Hyperlipidemia, unspecified: Secondary | ICD-10-CM | POA: Insufficient documentation

## 2022-01-23 DIAGNOSIS — R918 Other nonspecific abnormal finding of lung field: Secondary | ICD-10-CM | POA: Diagnosis present

## 2022-01-23 DIAGNOSIS — G4730 Sleep apnea, unspecified: Secondary | ICD-10-CM

## 2022-01-23 DIAGNOSIS — E039 Hypothyroidism, unspecified: Secondary | ICD-10-CM

## 2022-01-23 DIAGNOSIS — K219 Gastro-esophageal reflux disease without esophagitis: Secondary | ICD-10-CM | POA: Diagnosis not present

## 2022-01-23 DIAGNOSIS — I5032 Chronic diastolic (congestive) heart failure: Secondary | ICD-10-CM | POA: Insufficient documentation

## 2022-01-23 DIAGNOSIS — Z6836 Body mass index (BMI) 36.0-36.9, adult: Secondary | ICD-10-CM | POA: Insufficient documentation

## 2022-01-23 DIAGNOSIS — R911 Solitary pulmonary nodule: Secondary | ICD-10-CM | POA: Diagnosis present

## 2022-01-23 DIAGNOSIS — I700 Atherosclerosis of aorta: Secondary | ICD-10-CM | POA: Insufficient documentation

## 2022-01-23 DIAGNOSIS — I130 Hypertensive heart and chronic kidney disease with heart failure and stage 1 through stage 4 chronic kidney disease, or unspecified chronic kidney disease: Secondary | ICD-10-CM | POA: Diagnosis not present

## 2022-01-23 DIAGNOSIS — E669 Obesity, unspecified: Secondary | ICD-10-CM | POA: Insufficient documentation

## 2022-01-23 DIAGNOSIS — J449 Chronic obstructive pulmonary disease, unspecified: Secondary | ICD-10-CM | POA: Insufficient documentation

## 2022-01-23 DIAGNOSIS — E1122 Type 2 diabetes mellitus with diabetic chronic kidney disease: Secondary | ICD-10-CM | POA: Insufficient documentation

## 2022-01-23 DIAGNOSIS — Z992 Dependence on renal dialysis: Secondary | ICD-10-CM | POA: Insufficient documentation

## 2022-01-23 DIAGNOSIS — Z79899 Other long term (current) drug therapy: Secondary | ICD-10-CM | POA: Diagnosis not present

## 2022-01-23 DIAGNOSIS — Z87891 Personal history of nicotine dependence: Secondary | ICD-10-CM | POA: Diagnosis not present

## 2022-01-23 DIAGNOSIS — C3432 Malignant neoplasm of lower lobe, left bronchus or lung: Secondary | ICD-10-CM | POA: Insufficient documentation

## 2022-01-23 DIAGNOSIS — N186 End stage renal disease: Secondary | ICD-10-CM | POA: Insufficient documentation

## 2022-01-23 DIAGNOSIS — Z794 Long term (current) use of insulin: Secondary | ICD-10-CM | POA: Diagnosis not present

## 2022-01-23 HISTORY — PX: BRONCHIAL BIOPSY: SHX5109

## 2022-01-23 HISTORY — PX: FINE NEEDLE ASPIRATION: SHX5430

## 2022-01-23 HISTORY — PX: BRONCHIAL NEEDLE ASPIRATION BIOPSY: SHX5106

## 2022-01-23 HISTORY — PX: VIDEO BRONCHOSCOPY WITH RADIAL ENDOBRONCHIAL ULTRASOUND: SHX6849

## 2022-01-23 HISTORY — PX: ENDOBRONCHIAL ULTRASOUND: SHX5096

## 2022-01-23 LAB — POCT I-STAT, CHEM 8
BUN: 55 mg/dL — ABNORMAL HIGH (ref 8–23)
Calcium, Ion: 1.18 mmol/L (ref 1.15–1.40)
Chloride: 100 mmol/L (ref 98–111)
Creatinine, Ser: 4.9 mg/dL — ABNORMAL HIGH (ref 0.44–1.00)
Glucose, Bld: 101 mg/dL — ABNORMAL HIGH (ref 70–99)
HCT: 31 % — ABNORMAL LOW (ref 36.0–46.0)
Hemoglobin: 10.5 g/dL — ABNORMAL LOW (ref 12.0–15.0)
Potassium: 4.3 mmol/L (ref 3.5–5.1)
Sodium: 139 mmol/L (ref 135–145)
TCO2: 34 mmol/L — ABNORMAL HIGH (ref 22–32)

## 2022-01-23 LAB — GLUCOSE, CAPILLARY
Glucose-Capillary: 113 mg/dL — ABNORMAL HIGH (ref 70–99)
Glucose-Capillary: 107 mg/dL — ABNORMAL HIGH (ref 70–99)

## 2022-01-23 SURGERY — BRONCHOSCOPY, WITH BIOPSY USING ELECTROMAGNETIC NAVIGATION
Anesthesia: General | Laterality: Left

## 2022-01-23 MED ORDER — CHLORHEXIDINE GLUCONATE 0.12 % MT SOLN
15.0000 mL | Freq: Once | OROMUCOSAL | Status: AC
  Administered 2022-01-23: 15 mL via OROMUCOSAL
  Filled 2022-01-23: qty 15

## 2022-01-23 MED ORDER — EPHEDRINE SULFATE-NACL 50-0.9 MG/10ML-% IV SOSY
PREFILLED_SYRINGE | INTRAVENOUS | Status: DC | PRN
  Administered 2022-01-23 (×2): 5 mg via INTRAVENOUS

## 2022-01-23 MED ORDER — DEXAMETHASONE SODIUM PHOSPHATE 10 MG/ML IJ SOLN
INTRAMUSCULAR | Status: DC | PRN
  Administered 2022-01-23: 10 mg via INTRAVENOUS

## 2022-01-23 MED ORDER — LACTATED RINGERS IV SOLN: INTRAVENOUS | Status: DC

## 2022-01-23 MED ORDER — SUGAMMADEX SODIUM 200 MG/2ML IV SOLN
INTRAVENOUS | Status: DC | PRN
  Administered 2022-01-23 (×2): 200 mg via INTRAVENOUS

## 2022-01-23 MED ORDER — INSULIN ASPART 100 UNIT/ML IJ SOLN: 0.0000 [IU] | INTRAMUSCULAR | Status: DC | PRN

## 2022-01-23 MED ORDER — SODIUM CHLORIDE 0.9 % IV SOLN: INTRAVENOUS | Status: DC | PRN

## 2022-01-23 MED ORDER — PHENYLEPHRINE HCL-NACL 20-0.9 MG/250ML-% IV SOLN
INTRAVENOUS | Status: DC | PRN
  Administered 2022-01-23: 25 ug/min via INTRAVENOUS

## 2022-01-23 MED ORDER — PROPOFOL 10 MG/ML IV BOLUS
INTRAVENOUS | Status: DC | PRN
  Administered 2022-01-23: 150 mg via INTRAVENOUS

## 2022-01-23 MED ORDER — ROCURONIUM BROMIDE 10 MG/ML (PF) SYRINGE
PREFILLED_SYRINGE | INTRAVENOUS | Status: DC | PRN
  Administered 2022-01-23: 100 mg via INTRAVENOUS

## 2022-01-23 MED ORDER — FENTANYL CITRATE (PF) 100 MCG/2ML IJ SOLN
INTRAMUSCULAR | Status: DC | PRN
  Administered 2022-01-23 (×2): 50 ug via INTRAVENOUS

## 2022-01-23 MED ORDER — FENTANYL CITRATE (PF) 100 MCG/2ML IJ SOLN: 25.0000 ug | INTRAMUSCULAR | Status: DC | PRN

## 2022-01-23 MED ORDER — PHENYLEPHRINE 80 MCG/ML (10ML) SYRINGE FOR IV PUSH (FOR BLOOD PRESSURE SUPPORT)
PREFILLED_SYRINGE | INTRAVENOUS | Status: DC | PRN
  Administered 2022-01-23: 80 ug via INTRAVENOUS
  Administered 2022-01-23: 40 ug via INTRAVENOUS
  Administered 2022-01-23: 80 ug via INTRAVENOUS
  Administered 2022-01-23: 200 ug via INTRAVENOUS

## 2022-01-23 MED ORDER — LIDOCAINE 2% (20 MG/ML) 5 ML SYRINGE
INTRAMUSCULAR | Status: DC | PRN
  Administered 2022-01-23: 80 mg via INTRAVENOUS

## 2022-01-23 MED ORDER — ONDANSETRON HCL 4 MG/2ML IJ SOLN
INTRAMUSCULAR | Status: DC | PRN
  Administered 2022-01-23: 4 mg via INTRAVENOUS

## 2022-01-23 NOTE — Discharge Instructions (Signed)
Flexible Bronchoscopy, Care After ?This sheet gives you information about how to care for yourself after your test. Your doctor may also give you more specific instructions. If you have problems or questions, contact your doctor. ?Follow these instructions at home: ?Eating and drinking ?Do not eat or drink anything (not even water) for 2 hours after your test, or until your numbing medicine (local anesthetic) wears off. ?When your numbness is gone and your cough and gag reflexes have come back, you may: ?Eat only soft foods. ?Slowly drink liquids. ?The day after the test, go back to your normal diet. ?Driving ?Do not drive for 24 hours if you were given a medicine to help you relax (sedative). ?Do not drive or use heavy machinery while taking prescription pain medicine. ?General instructions ? ?Take over-the-counter and prescription medicines only as told by your doctor. ?Return to your normal activities as told. Ask what activities are safe for you. ?Do not use any products that have nicotine or tobacco in them. This includes cigarettes and e-cigarettes. If you need help quitting, ask your doctor. ?Keep all follow-up visits as told by your doctor. This is important. It is very important if you had a tissue sample (biopsy) taken. ?Get help right away if: ?You have shortness of breath that gets worse. ?You get light-headed. ?You feel like you are going to pass out (faint). ?You have chest pain. ?You cough up: ?More than a little blood. ?More blood than before. ?Summary ?Do not eat or drink anything (not even water) for 2 hours after your test, or until your numbing medicine wears off. ?Do not use cigarettes. Do not use e-cigarettes. ?Get help right away if you have chest pain. ? ?This information is not intended to replace advice given to you by your health care provider. Make sure you discuss any questions you have with your health care provider. ?Document Released: 07/22/2009 Document Revised: 09/06/2017 Document  Reviewed: 10/12/2016 ?Elsevier Patient Education ? [place]. ? ?

## 2022-01-23 NOTE — Interval H&P Note (Signed)
History and Physical Interval Note: ? ?01/23/2022 ?12:03 PM ? ?[name] [name]  has presented today for surgery, with the diagnosis of lung nodule.  The various methods of treatment have been discussed with the patient and family. After consideration of risks, benefits and other options for treatment, the patient has consented to  Procedure(s): ?ROBOTIC ASSISTED NAVIGATIONAL BRONCHOSCOPY (Left) as a surgical intervention.  The patient's history has been reviewed, patient examined, no change in status, stable for surgery.  I have reviewed the patient's chart and labs.  Questions were answered to the patient's satisfaction.   ? ? ?Octavio Graves Jenipher Havel ? ? ?

## 2022-01-23 NOTE — Transfer of Care (Signed)
Immediate Anesthesia Transfer of Care Note ? ?Patient: [name] [name] ? ?Procedure(s) Performed: ROBOTIC ASSISTED NAVIGATIONAL BRONCHOSCOPY (Left) ?RADIAL ENDOBRONCHIAL ULTRASOUND ?BRONCHIAL NEEDLE ASPIRATION BIOPSIES ?BRONCHIAL BIOPSIES ?ENDOBRONCHIAL ULTRASOUND ?FINE NEEDLE ASPIRATION (FNA) LINEAR ? ?Patient Location: PACU ? ?Anesthesia Type:General ? ?Level of Consciousness: awake and drowsy ? ?Airway & Oxygen Therapy: Patient Spontanous Breathing and Patient connected to face mask oxygen ? ?Post-op Assessment: Report given to RN, Post -op Vital signs reviewed and stable and Patient moving all extremities X 4 ? ?Post vital signs: Reviewed and stable ? ?Last Vitals:  ?Vitals Value Taken Time  ?BP 133/89 01/23/22 1351  ?Temp    ?Pulse 85 01/23/22 1353  ?Resp 16 01/23/22 1353  ?SpO2 98 % 01/23/22 1353  ?Vitals shown include unvalidated device data. ? ?Last Pain:  ?Vitals:  ? 01/23/22 1021  ?TempSrc: Oral  ?   ? ?  ? ?Complications: No notable events documented. ?

## 2022-01-23 NOTE — Anesthesia Procedure Notes (Signed)
Procedure Name: Intubation ?Date/Time: 01/23/2022 12:33 PM ?Performed by: Harden Mo, CRNA ?Pre-anesthesia Checklist: Patient identified, Emergency Drugs available, Suction available and Patient being monitored ?Patient Re-evaluated:Patient Re-evaluated prior to induction ?Oxygen Delivery Method: Circle System Utilized ?Preoxygenation: Pre-oxygenation with 100% oxygen ?Induction Type: IV induction ?Ventilation: Mask ventilation without difficulty ?Laryngoscope Size: Sabra Heck and 2 ?Grade View: Grade I ?Tube type: Oral ?Tube size: 8.5 mm ?Number of attempts: 1 ?Airway Equipment and Method: Stylet and Oral airway ?Placement Confirmation: ETT inserted through vocal cords under direct vision, positive ETCO2 and breath sounds checked- equal and bilateral ?Secured at: 22 cm ?Tube secured with: Tape ?Dental Injury: Teeth and Oropharynx as per pre-operative assessment  ? ? ? ? ?

## 2022-01-23 NOTE — Anesthesia Postprocedure Evaluation (Signed)
Anesthesia Post Note ? ?Patient: ICESS BERTONI ? ?Procedure(s) Performed: ROBOTIC ASSISTED NAVIGATIONAL BRONCHOSCOPY (Left) ?RADIAL ENDOBRONCHIAL ULTRASOUND ?BRONCHIAL NEEDLE ASPIRATION BIOPSIES ?BRONCHIAL BIOPSIES ?ENDOBRONCHIAL ULTRASOUND ?FINE NEEDLE ASPIRATION (FNA) LINEAR ? ?  ? ?Patient location during evaluation: PACU ?Anesthesia Type: General ?Level of consciousness: awake and alert ?Pain management: pain level controlled ?Vital Signs Assessment: post-procedure vital signs reviewed and stable ?Respiratory status: spontaneous breathing, nonlabored ventilation, respiratory function stable and patient connected to nasal cannula oxygen ?Cardiovascular status: blood pressure returned to baseline and stable ?Postop Assessment: no apparent nausea or vomiting ?Anesthetic complications: no ? ? ?No notable events documented. ? ?Last Vitals:  ?Vitals:  ? 01/23/22 1425 01/23/22 1440  ?BP: (!) 130/51 133/63  ?Pulse: 85 87  ?Resp: 19 20  ?Temp:  36.8 ?C  ?SpO2: 95% 95%  ?  ?Last Pain:  ?Vitals:  ? 01/23/22 1440  ?TempSrc:   ?PainSc: 0-No pain  ? ? ?  ?  ?  ?  ?  ?  ? ?Menaal Russum L Storie Heffern ? ? ? ? ?

## 2022-01-23 NOTE — Op Note (Signed)
Video Bronchoscopy with Robotic Assisted Bronchoscopic Navigation  ?Video Bronchoscopy with Endobronchial Ultrasound Procedure Note ? ?Date of Operation: 01/23/2022  ? ?Pre-op Diagnosis: Left lower lobe lung nodule ? ?Post-op Diagnosis: Left lower lobe lung mass ? ?Surgeon: Garner Nash, DO  ? ?Assistants: None  ? ?Anesthesia: General endotracheal anesthesia ? ?Operation: Flexible video fiberoptic bronchoscopy with robotic assistance and biopsies. ? ?Estimated Blood Loss: Minimal ? ?Complications: None ? ?Indications and History: ?[name] [name] is a 71 y.o. female with history of left lower lobe lung nodule. The risks, benefits, complications, treatment options and expected outcomes were discussed with the patient.  The possibilities of pneumothorax, pneumonia, reaction to medication, pulmonary aspiration, perforation of a viscus, bleeding, failure to diagnose a condition and creating a complication requiring transfusion or operation were discussed with the patient who freely signed the consent.   ? ?Description of Procedure: ?The patient was seen in the Preoperative Area, was examined and was deemed appropriate to proceed.  The patient was taken to [facility] endoscopy room 3, identified as [name] [name] and the procedure verified as Flexible Video Fiberoptic Bronchoscopy.  A Time Out was held and the above information confirmed.  ? ?Prior to the date of the procedure a high-resolution CT scan of the chest was performed. Utilizing ION software program a virtual tracheobronchial tree was generated to allow the creation of distinct navigation pathways to the patient's parenchymal abnormalities. After being taken to the operating room general anesthesia was initiated and the patient  was orally intubated. The video fiberoptic bronchoscope was introduced via the endotracheal tube and a general inspection was performed which showed normal right and left lung anatomy, aspiration of the bilateral mainstems was completed  to remove any remaining secretions. Robotic catheter inserted into patient's endotracheal tube.  ? ?Target #1 left lower lobe lung nodule: ?The distinct navigation pathways prepared prior to this procedure were then utilized to navigate to patient's lesion identified on CT scan. The robotic catheter was secured into place and the vision probe was withdrawn.  Lesion location was approximated using fluoroscopy, three-dimensional cone beam CT imaging, and radial endobronchial ultrasound for peripheral targeting. Under fluoroscopic guidance transbronchial needle brushings, transbronchial needle biopsies, and transbronchial forceps biopsies were performed to be sent for cytology and pathology. ? ?Target #2 Node 11L: ?Using real-time ultrasound guidance Wang needle biopsies were take from Station 11L nodes and were sent for cytology. The patient tolerated the procedure well without apparent complications. There was no significant blood loss. The bronchoscope was withdrawn. ? ?At the end of the procedure a general airway inspection was performed and there was no evidence of active bleeding. The bronchoscope was removed.  The patient tolerated the procedure well. There was no significant blood loss and there were no obvious complications. A post-procedural chest x-ray is pending. ? ?Samples Target #1: ?1. Transbronchial needle brushings from left lower lobe lung nodule ?2. Transbronchial Wang needle biopsies from left lower lobe lung nodule ?3. Transbronchial forceps biopsies from left lower lobe lung nodule ? ?Samples Target #2 ?1. Wang needle biopsies from 11L node ? ?Plans:  ?The patient will be discharged from the PACU to home when recovered from anesthesia and after chest x-ray is reviewed. We will review the cytology, pathology results with the patient when they become available. Outpatient followup will be with [name] Graves Payten Beaumier, DO. ? ?Garner Nash, DO ?[facility] ?01/23/2022 2:15 PM    ? ?

## 2022-01-24 ENCOUNTER — Encounter (HOSPITAL_COMMUNITY): Payer: Self-pay | Admitting: Pulmonary Disease

## 2022-01-30 ENCOUNTER — Ambulatory Visit: Payer: Medicare Other | Admitting: Acute Care

## 2022-01-30 LAB — CYTOLOGY - NON PAP

## 2022-01-31 ENCOUNTER — Encounter: Payer: Self-pay | Admitting: Nurse Practitioner

## 2022-01-31 ENCOUNTER — Ambulatory Visit (INDEPENDENT_AMBULATORY_CARE_PROVIDER_SITE_OTHER): Payer: Medicare (Managed Care) | Admitting: Nurse Practitioner

## 2022-01-31 VITALS — BP 124/76 | HR 88 | Temp 98.1°F | Ht 63.0 in | Wt 200.0 lb

## 2022-01-31 DIAGNOSIS — I5042 Chronic combined systolic (congestive) and diastolic (congestive) heart failure: Secondary | ICD-10-CM | POA: Diagnosis not present

## 2022-01-31 DIAGNOSIS — J449 Chronic obstructive pulmonary disease, unspecified: Secondary | ICD-10-CM | POA: Diagnosis not present

## 2022-01-31 DIAGNOSIS — C3430 Malignant neoplasm of lower lobe, unspecified bronchus or lung: Secondary | ICD-10-CM | POA: Insufficient documentation

## 2022-01-31 DIAGNOSIS — C3432 Malignant neoplasm of lower lobe, left bronchus or lung: Secondary | ICD-10-CM

## 2022-01-31 DIAGNOSIS — C3490 Malignant neoplasm of unspecified part of unspecified bronchus or lung: Secondary | ICD-10-CM | POA: Diagnosis not present

## 2022-01-31 NOTE — Assessment & Plan Note (Signed)
Appears compensated on exam.  Most recent echo with EF 40 to 45% and G1 DD.  Follow-up with cardiology as scheduled. ?

## 2022-01-31 NOTE — Patient Instructions (Addendum)
Continue Symbicort 2 puffs Twice daily. Brush tongue and rinse mouth afterwards ?Continue Albuterol inhaler 2 puffs or 3 mL neb every 6 hours as needed for shortness of breath or wheezing. Notify if symptoms persist despite rescue inhaler/neb use. ? ?MRI brain - someone will contact you for scheduling ? ?Referral to cardiothoracic surgery - someone will contact you for scheduling ? ?Pulmonary function testing scheduled today.  ? ?Follow up after PFTs with Dr. Valeta Harms or Alanson Aly. If symptoms do not improve or worsen, please contact office for sooner follow up or seek emergency care. ?

## 2022-01-31 NOTE — Assessment & Plan Note (Addendum)
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  Patient verbalized understanding of results and wishes to proceed with further intervention.  PET scan without evidence of metastatic disease.  Will obtain MRI of the brain for further evaluation and staging.  Referral placed today to CT surgery for possible resection.  PFTs ordered for surgical work-up. ? ?Patient Instructions  ?Continue Symbicort 2 puffs Twice daily. Brush tongue and rinse mouth afterwards ?Continue Albuterol inhaler 2 puffs or 3 mL neb every 6 hours as needed for shortness of breath or wheezing. Notify if symptoms persist despite rescue inhaler/neb use. ? ?MRI brain - someone will contact you for scheduling ? ?Referral to cardiothoracic surgery - someone will contact you for scheduling ? ?Pulmonary function testing scheduled today.  ? ?Follow up after PFTs with Dr. Valeta Harms or Alanson Aly. If symptoms do not improve or worsen, please contact office for sooner follow up or seek emergency care. ? ? ?

## 2022-01-31 NOTE — Progress Notes (Signed)
? ?@Patient  ID: [name] [name], female    DOB: 1950/10/17, 71 y.o.   MRN: [id number] ? ?Chief Complaint  ?Patient presents with  ? Follow-up  ?  She is here to go over biopsy results.   ? ? ?Referring provider: ?[name], MD ? ?HPI: ?71 year old female, former smoker (40-year history) followed for lung nodule, COPD with asthma.  She is a patient of Dr. Juline Patch and last seen in office 01/03/2022.  Past medical history significant for PAD, TIA, combined CHF, hypertension, OSA, DM 2, hypothyroidism, ESRD on HD, obesity. ? ?Previously followed left lower lobe pulmonary nodule that was first identified in 2020.  Patient was recently admitted in February 2023 and repeat CT scan of the chest showed a 2.8 x 1.8 cm left lower lobe pulmonary nodule, concerning for primary lung cancer.  She had also reported recent weight loss.  She was seen by Dr. Valeta Harms in the hospital for consultation who recommended PET scan.  PET scan completed showed a hypermetabolic 3 cm left lower lobe mass with SUV max of 11.2, concerning for malignancy.  She was recommended for bronchoscopy with biopsy which was completed on 01/23/2022.  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  The local lymph node that was biopsied as well was negative for malignant cells. ? ?TEST/EVENTS:  ?11/28/2021 CT chest without contrast: There is atherosclerosis present with CAD as well.  No LAD identified.  There is a right trace pleural effusion.  Mild dependent atelectasis in the lower lobes.  There is a 2.8 x 1.8 cm nodule in the left lower lobe, greatly enlarged from 2020 when it measured 1.2 cm.  There is associated mild spiculation and bandlike densities extend from the nodule posterolaterally to the pleura anteriorly or to the major fissure.  Nodules concerning for primary lung malignancy.  A 5 mm nodule in the right middle lobe is new.  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   Chronic bilateral adrenal enlargement is present as well.  There are also old rib fractures and a T3 vertebral body fracture. ?12/29/2021 PET scan: There is bilateral common carotid atherosclerosis present.  The 3.0 x 2.4 cm left lower lobe mass has a maximum SUV of 11.2, highly suspicious for malignancy.  There is reduced conspicuity of the subsolid right middle lobe nodule, with no definite abnormal hypermetabolic activity.  There was a small cutaneous/subcutaneous focus of activity along the left axilla, maximum SUV of 2.9, probably benign. ? ?01/31/2022: Today-follow-up ?Patient presents today for follow-up to discuss bronchoscopy result with cytology that showed malignant cells consistent with squamous carcinoma.  Reports that she has recovered well since bronchoscopy.  Initially was coughing up some blood but this is stopped.  Still has an occasional minimal cough but feels as though it is improving.  Breathing is at her baseline.  Denies any recent fevers, night sweats, lower extremity edema, wheezing.  Overall feels like she is doing relatively well. ? ?Allergies  ?Allergen Reactions  ? Nebivolol Swelling  ?  Chest pain (reaction to Bystolic)  ? Ace Inhibitors Swelling and Other (See Comments)  ?  Tongue swell  ? Morphine And Related Itching  ? ? ?Immunization History  ?Administered Date(s) Administered  ? PFIZER(Purple Top)SARS-COV-2 Vaccination 06/16/2020  ? ? ?Past Medical History:  ?Diagnosis Date  ? Anemia   ? Arthritis   ? Asthma   ? Chronic diastolic (congestive) heart failure (HCC)   ? Chronic kidney disease   ?  Dialysis T-TH-Sat  ? Chronic pain syndrome   ? Class 2 obesity due to excess calories with body mass index (BMI) of 36.0 to 36.9 in adult   ? COPD (chronic obstructive pulmonary disease) ([place])   ? Diabetes mellitus without complication ([place])   ? Type 1 Insulin Dependent  ? Full dentures   ? GERD (gastroesophageal reflux disease)   ? Gout   ? History of hiatal hernia   ? History of transfusion    ? Hx MRSA infection   ? abscess left groin  ? Hyperlipidemia   ? Hypertension   ? Hypothyroid   ? 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 Peripheral vascular disease ([place])   ? Sleep apnea   ? UNABLE TO TOLERATE C PAP  ? Stress incontinence   ? TIA (transient ischemic attack)   ?  X2 NO RESIDUAL PROBLEMS  ? Wears glasses   ? ? ?Tobacco History: ?Social History  ? ?Tobacco Use  ?Smoking Status Former  ? Packs/day: 0.25  ? Years: 40.00  ? Pack years: 10.00  ? Types: Cigarettes  ? Quit date: 10/2021  ? Years since quitting: 0.3  ?Smokeless Tobacco Former  ? ?Counseling given: Not Answered ? ? ?Outpatient Medications Prior to Visit  ?Medication Sig Dispense Refill  ? acetaminophen (TYLENOL) 500 MG tablet Take 1,000 mg by mouth 3 (three) times daily.    ? albuterol (PROVENTIL) (2.5 MG/3ML) 0.083% nebulizer solution Take 3 mLs (2.5 mg total) by nebulization every 6 (six) hours as needed for wheezing or shortness of breath. 75 mL 12  ? albuterol (VENTOLIN HFA) 108 (90 Base) MCG/ACT inhaler Inhale 2 puffs into the lungs every 6 (six) hours as needed for shortness of breath (COPD).    ? allopurinol (ZYLOPRIM) 100 MG tablet Take 1 tablet (100 mg total) by mouth daily. 30 tablet 0  ? budesonide-formoterol (SYMBICORT) 160-4.5 MCG/ACT inhaler Inhale 2 puffs into the lungs See admin instructions. Tues., Thurs, and Saturday before dialysis    ? carvedilol (COREG) 12.5 MG tablet Take 1 tablet (12.5 mg total) by mouth 2 (two) times daily. 60 tablet 0  ? cetirizine (ZYRTEC) 10 MG tablet Take 10 mg by mouth daily as needed for allergies.    ? clopidogrel (PLAVIX) 75 MG tablet Take 75 mg by mouth daily.    ? Insulin Lispro Prot & Lispro (HUMALOG 75/25 MIX) (75-25) 100 UNIT/ML Kwikpen Inject 10-20 Units into the skin See admin instructions. Take 10 units is the morning and 20 units at bedtime    ? levothyroxine (SYNTHROID) 200 MCG tablet Take 1 tablet (200 mcg total) by mouth daily at 6 (six) AM. 30 tablet 0  ? lidocaine (LIDODERM) 5 % 2  patches every 12 (twelve) hours. 12 hours off ?1 patch Each side of lower back    ? lubiprostone (AMITIZA) 24 MCG capsule Take 24 mcg by mouth 2 (two) times daily.    ? magnesium oxide (MAG-OX) 400 (241.3 Mg) MG tablet Take 1 tablet (400 mg total) by mouth 2 (two) times daily. 30 tablet 0  ? multivitamin (RENA-VIT) TABS tablet Take 1 tablet by mouth daily.    ? OVER THE COUNTER MEDICATION Place 1 drop into both eyes daily as needed (Itching eye). Refresh opt lubricant eye drops    ? pantoprazole (PROTONIX) 40 MG tablet Take 1 tablet (40 mg total) by mouth 2 (two) times daily before a meal. (Patient taking differently: Take 40 mg by mouth 2 (two) times daily.)    ?  polyethylene glycol (MIRALAX / GLYCOLAX) 17 g packet Take 17 g by mouth daily. 14 each 0  ? senna-docusate (SENOKOT-S) 8.6-50 MG tablet Take 1 tablet by mouth at bedtime as needed for moderate constipation.    ? sevelamer carbonate (RENVELA) 800 MG tablet Take 800 mg by mouth 3 (three) times daily with meals.    ? thiamine (VITAMIN B-1) 100 MG tablet Take 100 mg by mouth daily.    ? TRULICITY 0.10 OF/1.2RF SOPN Inject 0.75 mg into the skin once a week.    ? TRULICITY 1.5 XJ/8.8TG SOPN Inject 1.5 mg into the skin once a week. Monday 2 mL 0  ? cinacalcet (SENSIPAR) 30 MG tablet Take 1 tablet (30 mg total) by mouth daily. (Patient not taking: Reported on 01/31/2022) 30 tablet 0  ? ?No facility-administered medications prior to visit.  ? ? ? ?Review of Systems:  ? ?Constitutional: No night sweats, fevers, chills. + Fatigue (baseline); progressive unintentional weight loss ?HEENT: No headaches, difficulty swallowing, tooth/dental problems, or sore throat. No sneezing, itching, ear ache, nasal congestion, or post nasal drip ?CV:  No chest pain, orthopnea, PND, swelling in lower extremities, anasarca, dizziness, palpitations, syncope ?Resp: +shortness of breath with exertion (baseline). No excess mucus or change in color of mucus. No productive or non-productive.  No hemoptysis. No wheezing.  No chest wall deformity ?GI:  No heartburn, indigestion, abdominal pain, nausea, vomiting, diarrhea, change in bowel habits, loss of appetite, bloody stools.  ?Skin: No rash

## 2022-01-31 NOTE — Assessment & Plan Note (Signed)
Appears compensated on current regimen.  Recovered well after bronchoscopy with breathing at baseline.  Continue Symbicort and as needed albuterol.  Will need PFTs to evaluate FEV1 prior to possible surgical intervention ?

## 2022-02-06 ENCOUNTER — Other Ambulatory Visit: Payer: Self-pay | Admitting: Nurse Practitioner

## 2022-02-06 DIAGNOSIS — C3490 Malignant neoplasm of unspecified part of unspecified bronchus or lung: Secondary | ICD-10-CM

## 2022-02-08 ENCOUNTER — Other Ambulatory Visit: Payer: Self-pay | Admitting: *Deleted

## 2022-02-08 NOTE — Progress Notes (Signed)
The proposed treatment discussed in conference is for discussion purpose only and is not a binding recommendation. The patient was not physically examined, or presented with their treatment options. Therefore, final treatment plans cannot be decided.  ?

## 2022-02-09 ENCOUNTER — Other Ambulatory Visit: Payer: Self-pay | Admitting: Nurse Practitioner

## 2022-02-15 NOTE — Progress Notes (Signed)
? ?   ?[address] ?      [place] [address] ?            [phone]   ?                 ?[name] ?[place] Record #[id number] ?Date of Birth: 1951/05/16 ? ?Referring: Clayton Bibles, NP ?[facility] ?Primary Cardiologist: None ? ?Chief Complaint:    ?Chief Complaint  ?Patient presents with  ? Lung Lesion  ?  Initial surgical consult, bronch 4/18, pfts 4/26, pet 3/24, CT chest 2/21  ? ? ?History of Present Illness:    ?[name] [name] 71 y.o. female presents for surgical evaluation of biopsy-proven left lower lobe non-small cell lung cancer.  She has a history of chronic kidney disease and is on dialysis.  She also has been wheelchair-bound since the beginning of the year due to generalized weakness.  She denies any shortness of breath or weight changes.  She denies any neurologic symptoms. ?  ? ?Past Medical History:  ?Diagnosis Date  ? Anemia   ? Arthritis   ? Asthma   ? Chronic diastolic (congestive) heart failure (HCC)   ? Chronic kidney disease   ? Dialysis T-TH-Sat  ? Chronic pain syndrome   ? Class 2 obesity due to excess calories with body mass index (BMI) of 36.0 to 36.9 in adult   ? COPD (chronic obstructive pulmonary disease) ([place])   ? 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 Full dentures   ? GERD (gastroesophageal reflux disease)   ? Gout   ? History of hiatal hernia   ? History of transfusion   ? Hx MRSA infection   ? abscess left groin  ? Hyperlipidemia   ? Hypertension   ? Hypothyroid   ? 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 Peripheral vascular disease ([place])   ? Sleep apnea   ? UNABLE TO TOLERATE C PAP  ? Stress incontinence   ? TIA (transient ischemic attack)   ?  X2 NO RESIDUAL PROBLEMS  ? Wears glasses   ? ? ?Past Surgical History:  ?Procedure Laterality Date  ? ABDOMINAL HYSTERECTOMY    ? AV FISTULA PLACEMENT Left 12/01/2021  ? Procedure: INSERTION OF LEFT ARM  ARTERIOVENOUS (AV) GORE-TEX GRAFT;  Surgeon: Serafina Mitchell, MD;  Location: [facility];  Service: Vascular;  Laterality: Left;  ? BACK SURGERY  2020  ? BASCILIC VEIN TRANSPOSITION Left 04/05/2020  ? Procedure: BASILIC VEIN TRANSPOSITION FIRST STAGE;  Surgeon: Angelia Mould, MD;  Location: [facility];  Service: Vascular;  Laterality: Left;  ? BRONCHIAL BIOPSY  01/23/2022  ? Procedure: BRONCHIAL BIOPSIES;  Surgeon: Garner Nash, DO;  Location: [facility];  Service: Pulmonary;;  ? BRONCHIAL NEEDLE ASPIRATION BIOPSY  01/23/2022  ? Procedure: BRONCHIAL NEEDLE ASPIRATION BIOPSIES;  Surgeon: Garner Nash, DO;  Location: [facility];  Service: Pulmonary;;  ? [place]  ? DUE TO POLYP  ? DILATION AND CURETTAGE OF UTERUS    ? ENDOBRONCHIAL ULTRASOUND  01/23/2022  ? Procedure: ENDOBRONCHIAL ULTRASOUND;  Surgeon: Garner Nash, DO;  Location: [facility];  Service: Pulmonary;;  ? ENTEROSCOPY N/A 07/10/2021  ? Procedure: ENTEROSCOPY;  Surgeon: Ronnette Juniper, MD;  Location: [facility];  Service: Gastroenterology;  Laterality: N/A;  ? ESOPHAGOGASTRODUODENOSCOPY N/A 01/25/2021  ? Procedure: ESOPHAGOGASTRODUODENOSCOPY (EGD);  Surgeon: Clarene Essex, MD;  Location: [facility];  Service: Endoscopy;  Laterality: N/A;  ? FINE NEEDLE ASPIRATION  01/23/2022  ? Procedure: FINE NEEDLE ASPIRATION (FNA) LINEAR;  Surgeon: Garner Nash, DO;  Location: [facility];  Service: Pulmonary;;  ? FOREARM FRACTURE SURGERY    ? Left arm  ? GIVENS CAPSULE STUDY N/A 07/07/2021  ? Procedure: GIVENS CAPSULE STUDY;  Surgeon: Clarene Essex, MD;  Location: [facility];  Service: Endoscopy;  Laterality: N/A;  ? HERNIA REPAIR    ? w/ mesh  ? HOT HEMOSTASIS N/A 07/10/2021  ? Procedure: HOT HEMOSTASIS (ARGON PLASMA COAGULATION/BICAP);  Surgeon: Ronnette Juniper, MD;  Location: [facility];  Service: Gastroenterology;  Laterality: N/A;  ? INCISION AND DRAINAGE ABSCESS Left 02/04/2013  ? Procedure: INCISION AND DRAINAGE LEFT BUTTOCK ABSCESS; INCISION  AND DRAINAGE LEFT BREAST ABSCESS;  Surgeon: Harl Bowie, MD;  Location: [facility];  Service: General;  Laterality: Left;  ? INCISION AND DRAINAGE ABSCESS N/A 02/12/2013  ? Procedure: INCISION AND DEBRIDEMENT BUTTOCK WOUND ;  Surgeon: [name]. Georgette Dover, MD;  Location: [facility];  Service: General;  Laterality: N/A;  ? INCISION AND DRAINAGE ABSCESS N/A 02/14/2013  ? Procedure: INCISION AND DRAINAGE/DRESSING CHANGE;  Surgeon: Harl Bowie, MD;  Location: [facility];  Service: General;  Laterality: N/A;  ? INCISION AND DRAINAGE ABSCESS N/A 03/21/2015  ? Procedure: INCISION AND DRAINAGE PUBIC ABSCESS;  Surgeon: Excell Seltzer, MD;  Location: [facility];  Service: General;  Laterality: N/A;  ? INCISION AND DRAINAGE PERIRECTAL ABSCESS Left 02/10/2013  ? Procedure: IRRIGATION AND DEBRIDEMENT OF BUTTOCK/PERINEAL ABSCESS;  Surgeon: [name]. Georgette Dover, MD;  Location: [facility];  Service: General;  Laterality: Left;  ? [place] N/A 02/16/2013  ? Procedure: IRRIGATION AND DEBRIDEMENT PERINEAL ABSCESS;  Surgeon: Zenovia Jarred, MD;  Location: [facility];  Service: General;  Laterality: N/A;  ? IR FLUORO GUIDE CV LINE RIGHT  11/30/2021  ? IR US GUIDE VASC ACCESS RIGHT  11/30/2021  ? IRRIGATION AND DEBRIDEMENT ABSCESS N/A 02/06/2013  ? Procedure: IRRIGATION AND DEBRIDEMENT BUTTOCK ABSCESS AND DRESSING CHANGE;  Surgeon: Harl Bowie, MD;  Location: [facility];  Service: General;  Laterality: N/A;  ? IRRIGATION AND DEBRIDEMENT ABSCESS Left 02/08/2013  ? Procedure: IRRIGATION AND DEBRIDEMENT ABSCESS/DRESSING CHANGE;  Surgeon: Gwenyth Ober, MD;  Location: [facility];  Service: General;  Laterality: Left;  ? JOINT REPLACEMENT  2010 / 2012   ? LAPAROSCOPIC CHOLECYSTECTOMY    ? LEFT HEART CATHETERIZATION WITH CORONARY ANGIOGRAM N/A 07/02/2014  ? Procedure: LEFT HEART CATHETERIZATION WITH CORONARY ANGIOGRAM;  Surgeon: Lorretta Harp, MD;  Location: [facility];  Service: Cardiovascular;  Laterality: N/A;  ? LOWER EXTREMITY ANGIOGRAM  Right 04/20/2016  ? Procedure: Lower Extremity Angiogram;  Surgeon: Elam Dutch, MD;  Location: [facility];  Service: Cardiovascular;  Laterality: Right;  ? MULTIPLE TOOTH EXTRACTIONS    ? PERIPHERAL VASCULAR CATHETERIZATION N/A 04/20/2016  ? Procedure: Abdominal Aortogram;  Surgeon: Elam Dutch, MD;  Location: [facility];  Service: Cardiovascular;  Laterality: N/A;  ? PERIPHERAL VASCULAR CATHETERIZATION Right 04/20/2016  ? Procedure: Peripheral Vascular Intervention;  Surgeon: Elam Dutch, MD;  Location: [facility];  Service: Cardiovascular;  Laterality: Right;  [place]  ? RIGHT HEART CATH N/A 10/27/2018  ? Procedure: RIGHT HEART CATH;  Surgeon: Larey Dresser, MD;  Location: [facility];  Service: Cardiovascular;  Laterality: N/A;  ? RIGHT HEART CATH N/A 03/20/2019  ? Procedure: RIGHT HEART  CATH;  Surgeon: Larey Dresser, MD;  Location: [facility];  Service: Cardiovascular;  Laterality: N/A;  ? THYROIDECTOMY    ? TOTAL HIP REVISION Right 08/11/2014  ? Procedure: RIGHT ACETABULAR REVISION;  Surgeon: Gearlean Alf, MD;  Location: [facility];  Service: Orthopedics;  Laterality: Right;  ? VASCULAR SURGERY    ? VIDEO BRONCHOSCOPY WITH RADIAL ENDOBRONCHIAL ULTRASOUND  01/23/2022  ? Procedure: RADIAL ENDOBRONCHIAL ULTRASOUND;  Surgeon: Garner Nash, DO;  Location: [facility];  Service: Pulmonary;;  ? ? ?Family History  ?Problem Relation Age of Onset  ? Diabetes Brother   ? Cardiomyopathy Mother   ? Heart disease Mother   ? Cancer Father   ? ? ? ?Social History  ? ?Tobacco Use  ?Smoking Status Former  ? Packs/day: 0.25  ? Years: 40.00  ? Pack years: 10.00  ? Types: Cigarettes  ? Quit date: 10/2021  ? Years since quitting: 0.3  ?Smokeless Tobacco Former  ?  ?Social History  ? ?Substance and Sexual Activity  ?Alcohol Use Yes  ? Alcohol/week: 1.0 standard drink  ? Types: 1 Standard drinks or equivalent per week  ? Comment: [facility]  ? ? ? ?Allergies  ?Allergen Reactions  ?  Nebivolol Swelling  ?  Chest pain (reaction to Bystolic)  ? Ace Inhibitors Swelling and Other (See Comments)  ?  Tongue swell  ? Morphine And Related Itching  ? ? ?Current Outpatient Medications  ?Medication Sig

## 2022-02-16 ENCOUNTER — Institutional Professional Consult (permissible substitution) (INDEPENDENT_AMBULATORY_CARE_PROVIDER_SITE_OTHER): Payer: Medicare (Managed Care) | Admitting: Thoracic Surgery (Cardiothoracic Vascular Surgery)

## 2022-02-16 VITALS — BP 119/75 | HR 80 | Resp 20 | Ht 63.0 in | Wt 210.0 lb

## 2022-02-16 DIAGNOSIS — C3432 Malignant neoplasm of lower lobe, left bronchus or lung: Secondary | ICD-10-CM | POA: Diagnosis not present

## 2022-02-19 ENCOUNTER — Encounter: Payer: Self-pay | Admitting: *Deleted

## 2022-02-19 ENCOUNTER — Telehealth: Payer: Self-pay | Admitting: Internal Medicine

## 2022-02-19 DIAGNOSIS — R918 Other nonspecific abnormal finding of lung field: Secondary | ICD-10-CM

## 2022-02-19 NOTE — Telephone Encounter (Signed)
Scheduled appt per 5/12 referral. Pt is aware of appt date and time. Pt is aware to arrive 15 mins prior to appt time and to bring and updated insurance card. Pt is aware of appt location.   ?

## 2022-02-19 NOTE — Progress Notes (Signed)
Oncology Nurse Navigator Documentation ? ? ?  02/19/2022  ?  9:00 AM  ?Oncology Nurse Navigator Flowsheets  ?Navigator Follow Up Date: 02/26/2022  ?Navigator Follow Up Reason: New Patient Appointment  ?Navigator Location [facility]  ?Referral Date to RadOnc/MedOnc 02/19/2022  ?Navigator Encounter Type Other:  ?Treatment Phase Pre-Tx/Tx Discussion  ?Barriers/Navigation Needs Coordination of Care/I received referral for Ms. [name] [name] to see Dr. Julien Nordmann today. I updated new patient coordinator to call and schedule her to be seen next week 5/22 with labs.   ?Interventions Coordination of Care  ?Acuity Level 2-Minimal Needs (1-2 Barriers Identified)  ?Coordination of Care Other  ?Time Spent with Patient 15  ?  ?

## 2022-02-26 ENCOUNTER — Encounter: Payer: Self-pay | Admitting: *Deleted

## 2022-02-26 ENCOUNTER — Inpatient Hospital Stay: Payer: Medicare (Managed Care)

## 2022-02-26 ENCOUNTER — Other Ambulatory Visit: Payer: Self-pay

## 2022-02-26 ENCOUNTER — Encounter: Payer: Self-pay | Admitting: Internal Medicine

## 2022-02-26 ENCOUNTER — Inpatient Hospital Stay: Payer: Medicare (Managed Care) | Attending: Internal Medicine | Admitting: Internal Medicine

## 2022-02-26 ENCOUNTER — Telehealth: Payer: Self-pay | Admitting: Medical Oncology

## 2022-02-26 VITALS — BP 123/52 | HR 81 | Temp 96.9°F | Resp 19 | Wt 202.4 lb

## 2022-02-26 DIAGNOSIS — I132 Hypertensive heart and chronic kidney disease with heart failure and with stage 5 chronic kidney disease, or end stage renal disease: Secondary | ICD-10-CM | POA: Diagnosis not present

## 2022-02-26 DIAGNOSIS — C3432 Malignant neoplasm of lower lobe, left bronchus or lung: Secondary | ICD-10-CM | POA: Insufficient documentation

## 2022-02-26 DIAGNOSIS — E1122 Type 2 diabetes mellitus with diabetic chronic kidney disease: Secondary | ICD-10-CM | POA: Diagnosis not present

## 2022-02-26 DIAGNOSIS — C3490 Malignant neoplasm of unspecified part of unspecified bronchus or lung: Secondary | ICD-10-CM | POA: Diagnosis not present

## 2022-02-26 DIAGNOSIS — Z992 Dependence on renal dialysis: Secondary | ICD-10-CM | POA: Insufficient documentation

## 2022-02-26 DIAGNOSIS — R918 Other nonspecific abnormal finding of lung field: Secondary | ICD-10-CM

## 2022-02-26 DIAGNOSIS — N186 End stage renal disease: Secondary | ICD-10-CM | POA: Diagnosis not present

## 2022-02-26 LAB — CMP (CANCER CENTER ONLY)
ALT: 7 U/L (ref 0–44)
AST: 11 U/L — ABNORMAL LOW (ref 15–41)
Albumin: 3.6 g/dL (ref 3.5–5.0)
Alkaline Phosphatase: 148 U/L — ABNORMAL HIGH (ref 38–126)
Anion gap: 11 (ref 5–15)
BUN: 77 mg/dL — ABNORMAL HIGH (ref 8–23)
CO2: 30 mmol/L (ref 22–32)
Calcium: 10 mg/dL (ref 8.9–10.3)
Chloride: 98 mmol/L (ref 98–111)
Creatinine: 5.95 mg/dL (ref 0.44–1.00)
GFR, Estimated: 7 mL/min — ABNORMAL LOW (ref >60)
Glucose, Bld: 163 mg/dL — ABNORMAL HIGH (ref 70–99)
Potassium: 4.2 mmol/L (ref 3.5–5.1)
Sodium: 139 mmol/L (ref 135–145)
Total Bilirubin: 0.3 mg/dL (ref 0.3–1.2)
Total Protein: 7 g/dL (ref 6.5–8.1)

## 2022-02-26 LAB — CBC WITH DIFFERENTIAL (CANCER CENTER ONLY)
Abs Immature Granulocytes: 0.04 10*3/uL (ref 0.00–0.07)
Basophils Absolute: 0 10*3/uL (ref 0.0–0.1)
Basophils Relative: 0 %
Eosinophils Absolute: 0.1 10*3/uL (ref 0.0–0.5)
Eosinophils Relative: 1 %
HCT: 28 % — ABNORMAL LOW (ref 36.0–46.0)
Hemoglobin: 8.6 g/dL — ABNORMAL LOW (ref 12.0–15.0)
Immature Granulocytes: 0 %
Lymphocytes Relative: 14 %
Lymphs Abs: 1.4 10*3/uL (ref 0.7–4.0)
MCH: 29.3 pg (ref 26.0–34.0)
MCHC: 30.7 g/dL (ref 30.0–36.0)
MCV: 95.2 fL (ref 80.0–100.0)
Monocytes Absolute: 0.9 10*3/uL (ref 0.1–1.0)
Monocytes Relative: 8 %
Neutro Abs: 7.8 10*3/uL — ABNORMAL HIGH (ref 1.7–7.7)
Neutrophils Relative %: 77 %
Platelet Count: 254 10*3/uL (ref 150–400)
RBC: 2.94 MIL/uL — ABNORMAL LOW (ref 3.87–5.11)
RDW: 16.6 % — ABNORMAL HIGH (ref 11.5–15.5)
WBC Count: 10.3 10*3/uL (ref 4.0–10.5)
nRBC: 0 % (ref 0.0–0.2)

## 2022-02-26 NOTE — Progress Notes (Signed)
Oncology Nurse Navigator Documentation     02/26/2022    4:00 PM 02/19/2022    9:00 AM  Oncology Nurse Navigator Flowsheets  Abnormal Finding Date 11/28/2021   Confirmed Diagnosis Date 01/23/2022   Diagnosis Status Confirmed Diagnosis Complete   Planned Course of Treatment Radiation   Phase of Treatment Radiation   Navigator Follow Up Date: 03/01/2022 02/26/2022  Navigator Follow Up Reason: Appointment Review New Patient Appointment  Navigator Location [facility]  Referral Date to RadOnc/MedOnc  02/19/2022  Navigator Encounter Type Initial MedOnc Other:  Patient Visit Type Initial;MedOnc   Treatment Phase Pre-Tx/Tx Discussion Pre-Tx/Tx Discussion  Barriers/Navigation Needs Education Coordination of Care  Education Newly Diagnosed Cancer Education;Other/I met Ms. [name] [name] at her first appt with Dr. Julien Nordmann today.  Treatment plan discussed is referral to rad onc for radiation therapy.  I added information on lung cancer and community resources to AVS.  Will follow up on Rad Onc appt.    Interventions Education;Psycho-Social Support Coordination of Care  Acuity Level 2-Minimal Needs (1-2 Barriers Identified) Level 2-Minimal Needs (1-2 Barriers Identified)  Coordination of Care  Other  Education Method Verbal;Other   Time Spent with Patient 30 15

## 2022-02-26 NOTE — Progress Notes (Signed)
[place] [phone]   [phone]  CONSULT NOTE  REFERRING PHYSICIAN: Dr. Melodie Bouillon  REASON FOR CONSULTATION:  71 years old African-American female recently diagnosed with lung cancer.  HPI [name] [name] is a 71 y.o. female with past medical history significant for multiple medical problems including history of congestive heart failure, end-stage renal disease and currently on hemodialysis on Tuesday, Thursday and Saturday, anemia, chronic back syndrome, COPD, diabetes mellitus, GERD, gout, hypertension, dyslipidemia, hypothyroidism and TIA as well as long history of smoking.  The patient was followed by Dr. Elsworth Soho for her COPD and she was noted on previous imaging studies few years ago to have a suspicious nodule in the left lower lobe.  This was monitored by observation.  She had repeat CT scan of the chest without contrast on November 28, 2021 and it showed 2.8 x 1.8 cm left lower lobe nodule that has greatly enlarged from 2020 and most concerning for primary lung malignancy.  There was also new 0.5 cm right middle lobe nodule that indeterminate and potentially postinfectious/inflammatory.  The patient the PET scan on December 29, 2021 and that showed 3.0 x 2.4 cm left lower lobe mass with maximum SUV of 11.2 highly suspicious for malignancy.  There was improvement in the subsolid right middle lobe nodule with no abnormal hypermetabolic activity.  The scan showed no concerning findings for mediastinal lymphadenopathy or extrathoracic metastatic disease. On 01/23/2022 she underwent video bronchoscopy with biopsy under the care of Dr. Valeta Harms. The final pathology ([id number]) showed malignant cells consistent with squamous cell carcinoma. Two immunohistochemical stains are performed with adequate control.  The neoplastic cells are positive for the squamous marker p40 and are negative for the pulmonary adeno marker TTF-1. The patient was seen by Dr.  Kipp Brood for discussion of surgical resection but she is not a good candidate for surgery and she was referred to me today for evaluation and recommendation regarding treatment of her condition. When seen today she is feeling fine with no concerning issues except for sinus problems as well as shortness of breath at baseline increased with exertion and chronic back pain.  She also has mild cough with no chest pain or hemoptysis.  She has no weight loss or night sweats.  She continues to have intermittent nausea and vomiting as well as few episodes of diarrhea with the dialysis.  She has intermittent headache with no visual changes. Family history significant for brother with diabetes mellitus.  Mother had cardiomyopathy.  Father had leukemia. The patient is married and has 1 son.  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  She was accompanied today by her sister [name] [name].  The patient has a history of smoking 1 pack/day for around 55 years and quit 3 months ago.  She drinks alcohol occasionally and no history of drug abuse.   HPI  Past Medical History:  Diagnosis Date   Anemia    Arthritis    Asthma    Chronic diastolic (congestive) heart failure (HCC)    Chronic kidney disease    Dialysis T-TH-Sat   Chronic pain syndrome    Class 2 obesity due to excess calories with body mass index (BMI) of 36.0 to 36.9 in adult    COPD (chronic obstructive pulmonary disease) (HCC)    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   Full dentures    GERD (gastroesophageal reflux disease)  Gout    History of hiatal hernia    History of transfusion    Hx MRSA infection    abscess left groin   Hyperlipidemia    Hypertension    Hypothyroid    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HCC)    Peripheral vascular disease ([place])    Sleep apnea    UNABLE TO TOLERATE C PAP   Stress incontinence    TIA (transient ischemic attack)     X2 NO  RESIDUAL PROBLEMS   Wears glasses     Past Surgical History:  Procedure Laterality Date   ABDOMINAL HYSTERECTOMY     AV FISTULA PLACEMENT Left 12/01/2021   Procedure: INSERTION OF LEFT ARM ARTERIOVENOUS (AV) GORE-TEX GRAFT;  Surgeon: Serafina Mitchell, MD;  Location: [place];  Service: Vascular;  Laterality: Left;   BACK SURGERY  [address]   [place] Left 04/05/2020   Procedure: BASILIC VEIN TRANSPOSITION FIRST STAGE;  Surgeon: Angelia Mould, MD;  Location: [place];  Service: Vascular;  Laterality: Left;   BRONCHIAL BIOPSY  01/23/2022   Procedure: BRONCHIAL BIOPSIES;  Surgeon: Garner Nash, DO;  Location: [facility];  Service: Pulmonary;;   BRONCHIAL NEEDLE ASPIRATION BIOPSY  01/23/2022   Procedure: BRONCHIAL NEEDLE ASPIRATION BIOPSIES;  Surgeon: Garner Nash, DO;  Location: [place] [facility];  Service: Pulmonary;;   COLON SURGERY  1995   DUE TO POLYP   DILATION AND CURETTAGE OF UTERUS     ENDOBRONCHIAL ULTRASOUND  01/23/2022   Procedure: ENDOBRONCHIAL ULTRASOUND;  Surgeon: Garner Nash, DO;  Location: [place] [facility];  Service: Pulmonary;;   ENTEROSCOPY N/A 07/10/2021   Procedure: ENTEROSCOPY;  Surgeon: Ronnette Juniper, MD;  Location: [place];  Service: Gastroenterology;  Laterality: N/A;   ESOPHAGOGASTRODUODENOSCOPY N/A 01/25/2021   Procedure: ESOPHAGOGASTRODUODENOSCOPY (EGD);  Surgeon: Clarene Essex, MD;  Location: [facility];  Service: Endoscopy;  Laterality: N/A;   FINE NEEDLE ASPIRATION  01/23/2022   Procedure: FINE NEEDLE ASPIRATION (FNA) LINEAR;  Surgeon: Garner Nash, DO;  Location: [facility];  Service: Pulmonary;;   FOREARM FRACTURE SURGERY     Left arm   GIVENS CAPSULE STUDY N/A 07/07/2021   Procedure: GIVENS CAPSULE STUDY;  Surgeon: Clarene Essex, MD;  Location: [place];  Service: Endoscopy;  Laterality: N/A;   HERNIA REPAIR     w/ mesh   HOT HEMOSTASIS N/A 07/10/2021   Procedure: HOT HEMOSTASIS (ARGON PLASMA COAGULATION/BICAP);  Surgeon: Ronnette Juniper, MD;  Location: [place];  Service: Gastroenterology;  Laterality: N/A;   INCISION AND DRAINAGE ABSCESS Left 02/04/2013   Procedure: INCISION AND DRAINAGE LEFT BUTTOCK ABSCESS; INCISION AND DRAINAGE LEFT BREAST ABSCESS;  Surgeon: Harl Bowie, MD;  Location: [place];  Service: General;  Laterality: Left;   INCISION AND DRAINAGE ABSCESS N/A 02/12/2013   Procedure: INCISION AND DEBRIDEMENT BUTTOCK WOUND ;  Surgeon: [name]. Georgette Dover, MD;  Location: [place];  Service: General;  Laterality: N/A;   INCISION AND DRAINAGE ABSCESS N/A 02/14/2013   Procedure: INCISION AND DRAINAGE/DRESSING CHANGE;  Surgeon: Harl Bowie, MD;  Location: [place];  Service: General;  Laterality: N/A;   INCISION AND DRAINAGE ABSCESS N/A 03/21/2015   Procedure: INCISION AND DRAINAGE PUBIC ABSCESS;  Surgeon: Excell Seltzer, MD;  Location: [facility];  Service: General;  Laterality: N/A;   INCISION AND DRAINAGE PERIRECTAL ABSCESS Left 02/10/2013   Procedure: IRRIGATION AND DEBRIDEMENT OF BUTTOCK/PERINEAL ABSCESS;  Surgeon: [name]. Georgette Dover, MD;  Location: [place];  Service: General;  Laterality: Left;   INCISION  AND DRAINAGE PERIRECTAL ABSCESS N/A 02/16/2013   Procedure: IRRIGATION AND DEBRIDEMENT PERINEAL ABSCESS;  Surgeon: Zenovia Jarred, MD;  Location: [place];  Service: General;  Laterality: N/A;   IR FLUORO GUIDE CV LINE RIGHT  11/30/2021   IR US GUIDE VASC ACCESS RIGHT  11/30/2021   IRRIGATION AND DEBRIDEMENT ABSCESS N/A 02/06/2013   Procedure: IRRIGATION AND DEBRIDEMENT BUTTOCK ABSCESS AND DRESSING CHANGE;  Surgeon: Harl Bowie, MD;  Location: [place];  Service: General;  Laterality: N/A;   IRRIGATION AND DEBRIDEMENT ABSCESS Left 02/08/2013   Procedure: IRRIGATION AND DEBRIDEMENT ABSCESS/DRESSING CHANGE;  Surgeon: Gwenyth Ober, MD;  Location: [place];  Service: General;  Laterality: Left;   JOINT REPLACEMENT  2010 / 2012    LAPAROSCOPIC CHOLECYSTECTOMY     [place] N/A 07/02/2014    Procedure: LEFT HEART CATHETERIZATION WITH CORONARY ANGIOGRAM;  Surgeon: Lorretta Harp, MD;  Location: [facility];  Service: Cardiovascular;  Laterality: N/A;   LOWER EXTREMITY ANGIOGRAM Right 04/20/2016   Procedure: Lower Extremity Angiogram;  Surgeon: Elam Dutch, MD;  Location: [place] [facility];  Service: Cardiovascular;  Laterality: Right;   MULTIPLE TOOTH EXTRACTIONS     PERIPHERAL VASCULAR CATHETERIZATION N/A 04/20/2016   Procedure: Abdominal Aortogram;  Surgeon: Elam Dutch, MD;  Location: [place] [facility];  Service: Cardiovascular;  Laterality: N/A;   PERIPHERAL VASCULAR CATHETERIZATION Right 04/20/2016   Procedure: Peripheral Vascular Intervention;  Surgeon: Elam Dutch, MD;  Location: [place] [facility];  Service: Cardiovascular;  Laterality: Right;  [place]   RIGHT HEART CATH N/A 10/27/2018   Procedure: RIGHT HEART CATH;  Surgeon: Larey Dresser, MD;  Location: [place] [facility];  Service: Cardiovascular;  Laterality: N/A;   RIGHT HEART CATH N/A 03/20/2019   Procedure: RIGHT HEART CATH;  Surgeon: Larey Dresser, MD;  Location: [place] [facility];  Service: Cardiovascular;  Laterality: N/A;   THYROIDECTOMY     TOTAL HIP REVISION Right 08/11/2014   Procedure: RIGHT ACETABULAR REVISION;  Surgeon: Gearlean Alf, MD;  Location: [facility];  Service: Orthopedics;  Laterality: Right;   VASCULAR SURGERY     VIDEO BRONCHOSCOPY WITH RADIAL ENDOBRONCHIAL ULTRASOUND  01/23/2022   Procedure: RADIAL ENDOBRONCHIAL ULTRASOUND;  Surgeon: Garner Nash, DO;  Location: [facility];  Service: Pulmonary;;    Family History  Problem Relation Age of Onset   Diabetes Brother    Cardiomyopathy Mother    Heart disease Mother    Cancer Father     Social History Social History   Tobacco Use   Smoking status: Former    Packs/day: 0.25    Years: 40.00    Pack years: 10.00    Types: Cigarettes    Quit date: 10/2021    Years since quitting: 0.3   Smokeless tobacco: Former   Scientific laboratory technician Use: Never used  Substance Use Topics   Alcohol use: Yes    Alcohol/week: 1.0 standard drink    Types: 1 Standard drinks or equivalent per week    Comment: OCC   Drug use: No    Allergies  Allergen Reactions   Nebivolol Swelling    Chest pain (reaction to Bystolic)   Ace Inhibitors Swelling and Other (See Comments)    Tongue swell   Morphine And Related Itching    Current Outpatient Medications  Medication Sig Dispense Refill   acetaminophen (TYLENOL) 500 MG tablet Take 1,000 mg by mouth 3 (three) times  daily.     albuterol (VENTOLIN HFA) 108 (90 Base) MCG/ACT inhaler Inhale 2 puffs into the lungs every 6 (six) hours as needed for shortness of breath (COPD).     allopurinol (ZYLOPRIM) 100 MG tablet Take 1 tablet (100 mg total) by mouth daily. 30 tablet 0   budesonide-formoterol (SYMBICORT) 160-4.5 MCG/ACT inhaler Inhale 2 puffs into the lungs See admin instructions. Tues., Thurs, and Saturday before dialysis     carvedilol (COREG) 12.5 MG tablet Take 1 tablet (12.5 mg total) by mouth 2 (two) times daily. 60 tablet 0   clopidogrel (PLAVIX) 75 MG tablet Take 75 mg by mouth daily.     Insulin Lispro Prot & Lispro (HUMALOG 75/25 MIX) (75-25) 100 UNIT/ML Kwikpen Inject 10-20 Units into the skin See admin instructions. Take 10 units is the morning and 20 units at bedtime     levothyroxine (SYNTHROID) 200 MCG tablet Take 1 tablet (200 mcg total) by mouth daily at 6 (six) AM. 30 tablet 0   lidocaine (LIDODERM) 5 % 2 patches every 12 (twelve) hours. 12 hours off 1 patch Each side of lower back     lidocaine-prilocaine (EMLA) cream SMARTSIG:sparingly Topical 3 Times a Week     lubiprostone (AMITIZA) 24 MCG capsule Take 24 mcg by mouth 2 (two) times daily.     magnesium oxide (MAG-OX) 400 (241.3 Mg) MG tablet Take 1 tablet (400 mg total) by mouth 2 (two) times daily. 30 tablet 0   multivitamin (RENA-VIT) TABS tablet Take 1 tablet by mouth daily.     OVER THE COUNTER  MEDICATION Place 1 drop into both eyes daily as needed (Itching eye). Refresh opt lubricant eye drops     pantoprazole (PROTONIX) 40 MG tablet Take 1 tablet (40 mg total) by mouth 2 (two) times daily before a meal. (Patient taking differently: Take 40 mg by mouth 2 (two) times daily.)     polyethylene glycol (MIRALAX / GLYCOLAX) 17 g packet Take 17 g by mouth daily. 14 each 0   senna-docusate (SENOKOT-S) 8.6-50 MG tablet Take 1 tablet by mouth at bedtime as needed for moderate constipation.     sevelamer carbonate (RENVELA) 800 MG tablet Take 800 mg by mouth 3 (three) times daily with meals.     thiamine (VITAMIN B-1) 100 MG tablet Take 100 mg by mouth daily.     albuterol (PROVENTIL) (2.5 MG/3ML) 0.083% nebulizer solution Take 3 mLs (2.5 mg total) by nebulization every 6 (six) hours as needed for wheezing or shortness of breath. (Patient not taking: Reported on 02/26/2022) 75 mL 12   No current facility-administered medications for this visit.    Review of Systems  Constitutional: positive for fatigue Eyes: negative Ears, nose, mouth, throat, and face: negative Respiratory: positive for cough and dyspnea on exertion Cardiovascular: negative Gastrointestinal: positive for diarrhea, nausea, and vomiting Genitourinary:negative Integument/breast: negative Hematologic/lymphatic: negative Musculoskeletal:positive for arthralgias and back pain Neurological: negative Behavioral/Psych: negative Endocrine: negative Allergic/Immunologic: negative  Physical Exam  RDE:YCXKG, healthy, no distress, well nourished, and well developed SKIN: skin color, texture, turgor are normal, no rashes or significant lesions HEAD: Normocephalic, No masses, lesions, tenderness or abnormalities EYES: normal, PERRLA, Conjunctiva are pink and non-injected EARS: External ears normal, Canals clear OROPHARYNX:no exudate, no erythema, and lips, buccal mucosa, and tongue normal  NECK: supple, no adenopathy, no  JVD LYMPH:  no palpable lymphadenopathy, no hepatosplenomegaly BREAST:not examined LUNGS: clear to auscultation , and palpation HEART: regular rate & rhythm, no murmurs, and no gallops ABDOMEN:abdomen soft,  non-tender, obese, normal bowel sounds, and no masses or organomegaly BACK: Back symmetric, no curvature., No CVA tenderness EXTREMITIES:no joint deformities, effusion, or inflammation, no edema  NEURO: alert & oriented x 3 with fluent speech, no focal motor/sensory deficits  PERFORMANCE STATUS: ECOG 1  LABORATORY DATA: Lab Results  Component Value Date   WBC 10.3 02/26/2022   HGB 8.6 (L) 02/26/2022   HCT 28.0 (L) 02/26/2022   MCV 95.2 02/26/2022   PLT 254 02/26/2022      Chemistry      Component Value Date/Time   NA 139 01/23/2022 1056   NA 146 (H) 12/23/2013 1132   K 4.3 01/23/2022 1056   CL 100 01/23/2022 1056   CO2 28 12/08/2021 0952   BUN 55 (H) 01/23/2022 1056   BUN 31 (H) 12/23/2013 1132   CREATININE 4.90 (H) 01/23/2022 1056   CREATININE 3.51 (HH) 10/17/2020 1234   CREATININE 1.32 (H) 06/29/2014 1100      Component Value Date/Time   CALCIUM 10.3 12/08/2021 0952   CALCIUM 9.1 09/07/2021 1036   ALKPHOS 50 11/27/2021 0148   AST 18 11/27/2021 0148   AST 14 (L) 10/17/2020 1234   ALT 13 11/27/2021 0148   ALT 11 10/17/2020 1234   BILITOT 0.8 11/27/2021 0148   BILITOT 0.3 10/17/2020 1234       RADIOGRAPHIC STUDIES: No results found.  ASSESSMENT: This is a very pleasant 71 years old African-American female recently diagnosed with a stage Ia (T1c, N0, M0) non-small cell lung cancer, squamous cell carcinoma presented with left lower lobe lung mass diagnosed in April 2023.   PLAN: I had a lengthy discussion with the patient and her sister today about her current disease stage, prognosis and treatment options. I personally and independently reviewed the scan images and discussed the result and showed the images to the patient and her sister. The patient is not a  good surgical candidate for surgical resection based on the evaluation of Dr. Kipp Brood because of her other comorbidities. I recommended for the patient to see radiation oncology for consideration of SBRT to the left lower lobe lung mass as a curative option. I will continue to monitor the patient closely after her radiotherapy with repeat CT scan of the chest in around 4 months initially and then every 6 months after that if no evidence for disease recurrence or progression. For the end-stage renal disease the patient will continue her current treatment with dialysis under the [facility]. The patient was advised to call immediately if she has any other concerning symptoms in the interval.  The patient voices understanding of current disease status and treatment options and is in agreement with the current care plan.  All questions were answered. The patient knows to call the clinic with any problems, questions or concerns. We can certainly see the patient much sooner if necessary.  Thank you so much for allowing me to participate in the care of [name] [name]. I will continue to follow up the patient with you and assist in her care.  The total time spent in the appointment was 60 minutes.  Disclaimer: This note was dictated with voice recognition software. Similar sounding words can inadvertently be transcribed and may not be corrected upon review.   [name] [name] Feb 26, 2022, 2:43 PM

## 2022-02-26 NOTE — Telephone Encounter (Signed)
CRITICAL VALUE STICKER  CRITICAL VALUE: creatinine 5.9   RECEIVER (on-site recipient of call):  DATE & TIME NOTIFIED: 05/22823 @ 1450  MESSENGER (representative from lab):Nira Conn  MD NOTIFIED: Julien Nordmann  TIME OF NOTIFICATION:0452  RESPONSE:  Pt is on dialysis

## 2022-02-26 NOTE — Patient Instructions (Signed)
Thank you for choosing [facility] to provide your care.   Should you have questions after your visit to the [facility]), please contact this office at [phone] between 8:30 AM and 4:30 PM.  Voice mails left after 4:00 PM may not be returned until the following business day.  Calls received after 4:30 PM will be answered by an off-site Nurse Triage Line.    Prescription Refills:  Please have your pharmacy contact us directly for most prescription requests.  Contact the office directly for refills of narcotics (pain medications). Allow 48-72 hours for refills.  Appointments: Please contact the [facility] scheduling department [phone] for questions regarding [facility] appointment scheduling.  Contact the schedulers with any scheduling changes so that your appointment can be rescheduled in a timely manner.   Central Scheduling for [facility] [phone] - Call to schedule procedures such as PET scans, CT scans, MRI, Ultrasound, etc.  To afford each patient quality time with our providers, please arrive 30 minutes before your scheduled appointment time.  If you arrive late for your appointment, you may be asked to reschedule.  We strive to give you quality time with our providers, and arriving late affects you and other patients whose appointments are after yours. If you are a no show for multiple scheduled visits, you may be dismissed from the clinic at the providers discretion.     Resources: [place] [employer] [phone] for additional information on assistance programs or assistance connecting with community support programs   [place]  ([phone]: Information regarding food stamps, Medicaid, and utility assistance [employer] [place] [employer]'s shared-ride transportation service for eligible riders who have a disability that prevents them from riding the fixed route bus.   [place] ([phone]  Helps people with Medicare understand their rights and benefits, navigate the Medicare system, and secure the quality healthcare they deserve American Cancer Society 772-106-3289 Assists patients locate various types of support and financial assistance Cancer Care: 1-800-813-HOPE (581) 258-4696) Provides financial assistance, online support groups, medication/co-pay assistance.   Transportation Assistance for appointments at [place]  Again, thank you for choosing [facility] for your care.       Lung Cancer Lung cancer is an abnormal growth of cancerous cells that forms a mass (malignant tumor) in a lung. There are several types of lung cancer. The types are based on the appearance of the tumor cells. The two most common types are: Non-small cell lung cancer. This type of lung cancer is the most common type. Non-small cell lung cancers include squamous cell carcinoma, adenocarcinoma, and large cell carcinoma. Small cell lung cancer. In this type of lung cancer, abnormal cells are smaller than those of non-small cell lung cancer. Small cell lung cancer gets worse (progresses) faster than non-small cell lung cancer. What are the causes? The most common cause of lung cancer is smoking tobacco. The second most common cause is exposure to a chemical called radon. What increases the risk? You are more likely to develop this condition if: You smoke tobacco. You have been exposed to: Secondhand tobacco smoke. Radon gas. Uranium. Asbestos. Arsenic in drinking water. Air pollution and diesel exhaust. You have a family or personal history of lung cancer. You have had lung radiation therapy in the past. You are older than age 71. What are the signs or symptoms? In the early stages, you may not have any symptoms. As the cancer progresses, symptoms may include: A  lasting cough, possibly with blood. Fatigue. Unexplained weight loss. Shortness of breath. High-pitched whistling sounds  when you breathe, most often when you breathe out (wheezing). Chest pain. Loss of appetite. Symptoms of advanced lung cancer include: Hoarseness. Bone or joint pain. Weakness. Change in the structure of the fingernails (clubbing), so that the nail looks like an upside-down spoon. Swelling of the face or arms. Inability to move the face (paralysis). Drooping eyelids. How is this diagnosed? This condition may be diagnosed based on: Your symptoms and medical history. A physical exam. A chest X-ray. A CT scan. Blood tests. Sputum tests. Removal of a sample of lung tissue (lung biopsy) for testing. Your cancer will be assessed (staged) to determine how severe it is and how much it has spread (metastasized). How is this treated? Treatment depends on the type and stage of your cancer. Treatment may include one or more of the following: Surgery to remove as much of the cancer as possible. Lymph nodes in the area may be removed and tested for cancer as well. Medicines that kill cancer cells (chemotherapy). High-energy rays that kill cancer cells (radiation therapy). Targeted therapy. This targets specific parts of cancer cells and the area around them to block the growth and spread of the cancer. Targeted therapy can help limit the damage to healthy cells. Immunotherapy. This treatment uses a person's own immune system to fight cancer by either boosting the immune system or changing how the immune system works. Follow these instructions at home:  Do not use any products that contain nicotine or tobacco. These products include cigarettes, chewing tobacco, and vaping devices, such as e-cigarettes. If you need help quitting, ask your health care provider. Do not drink alcohol. If you are admitted to the hospital, make sure your cancer specialist (oncologist) is aware. Your cancer may affect your treatment for other conditions. Take over-the-counter and prescription medicines only as told by your  health care provider. Work with your health care provider to manage any side effects of treatment. Keep all follow-up visits. This is important. Where to find support Consider joining a local support group for people who have been diagnosed with lung cancer. Where to find more information American Cancer Society: www.cancer.[place]): www.cancer.gov Contact a health care provider if you: Lose weight without trying. Have a persistent cough and wheezing. Feel short of breath. Get tired easily. Have bone or joint pain. Have difficulty swallowing. Notice that your voice is changing or getting hoarse. Have pain that does not get better with medicine. Get help right away if you: Cough up blood. Have chest pain or new breathing problems. Have a fever. Have swelling in an ankle, leg, or arm, or the face or neck. Have paralysis in your face. Are very confused. Have a drooping eyelid. These symptoms may represent a serious problem that is an emergency. Do not wait to see if the symptoms will go away. Get medical help right away. Call your local emergency services (911 in the [place] Do not drive yourself to the hospital. Summary Lung cancer is an abnormal growth of cancerous cells that forms a mass (malignant tumor) in a lung. There are several types of lung cancer. The types are based on the appearance of the tumor cells. The two most common types are non-small cell and small cell. The most common cause of lung cancer is smoking tobacco. Early symptoms include a lasting cough, possibly with blood, and fatigue, unexplained weight loss, and shortness of breath. After diagnosis,  treatment depends on the type and stage of your cancer. This information is not intended to replace advice given to you by your health care provider. Make sure you discuss any questions you have with your health care provider. Document Revised: 03/15/2021 Document Reviewed: 03/15/2021 Elsevier  Patient Education  [place].

## 2022-02-28 ENCOUNTER — Encounter: Payer: Self-pay | Admitting: *Deleted

## 2022-02-28 NOTE — Progress Notes (Signed)
Oncology Nurse Navigator Documentation     02/28/2022    7:00 AM 02/26/2022    4:00 PM 02/19/2022    9:00 AM  Oncology Nurse Navigator Flowsheets  Abnormal Finding Date  11/28/2021   Confirmed Diagnosis Date  01/23/2022   Diagnosis Status  Confirmed Diagnosis Complete   Planned Course of Treatment  Radiation   Phase of Treatment  Radiation   Navigator Follow Up Date: 03/07/2022 03/01/2022 02/26/2022  Navigator Follow Up Reason: Appointment Review;Review Note Appointment Review New Patient Appointment  Navigator Location [place]  Referral Date to RadOnc/MedOnc   02/19/2022  Navigator Encounter Type Appt/Treatment Plan Review Initial MedOnc Other:  Patient Visit Type Other Initial;MedOnc   Treatment Phase Pre-Tx/Tx Discussion Pre-Tx/Tx Discussion Pre-Tx/Tx Discussion  Barriers/Navigation Needs Coordination of Care/I followed up on [name] [name]'s schedule.  She is set up to see rad onc next week. Will follow up on rad onc tx plan recommendations.  Education Coordination of Care  Education  Newly Diagnosed Cancer Education;Other   Interventions Coordination of Care Education;Psycho-Social Support Coordination of Care  Acuity Level 2-Minimal Needs (1-2 Barriers Identified) Level 2-Minimal Needs (1-2 Barriers Identified) Level 2-Minimal Needs (1-2 Barriers Identified)  Coordination of Care Other  Other  Education Method  Verbal;Other   Time Spent with Patient [phone]

## 2022-03-02 NOTE — Progress Notes (Signed)
Thoracic Location of Tumor / Histology: Left Lower Lobe Lung  Patient has been followed by Dr. Elsworth Soho for COPD and was noted on imaging studies a few years ago to have a suspicious nodule in the left lower lobe and has been in observation since.  Bronchoscopy 01/23/2022  PET 12/29/2021: 3.0 x 2.4 cm left lower lobe mass with maximum SUV of 11.2 highly suspicious for malignancy.  There was improvement in the subsolid right middle lobe nodule with no abnormal hypermetabolic activity.  The scan showed no concerning findings for mediastinal lymphadenopathy or extrathoracic metastatic disease.  CT Chest 11/28/2021: 2.8 x 1.8 cm left lower lobe nodule that has greatly enlarged from 2020 and most concerning for primary lung malignancy.   There was also new 0.5 cm right middle lobe nodule that indeterminate and potentially postinfectious/inflammatory.   Biopsies of Left Lower Lobe Lung 01/23/2022    Tobacco/Marijuana/Snuff/ETOH use: Former Smoker, quit recently.   Past/Anticipated interventions by cardiothoracic surgery, if any:  -Not a surgical Candidate   Past/Anticipated interventions by medical oncology, if any:  Dr. Julien Nordmann 02/26/2022 -The patient was seen by Dr. Kipp Brood for discussion of surgical resection but she is not a good candidate for surgery and she was referred to me today for evaluation and recommendation regarding treatment of her condition. -I recommended for the patient to see radiation oncology for consideration of SBRT to the left lower lobe lung mass as a curative option.   Signs/Symptoms Weight changes, if any: No Respiratory complaints, if any: No Hemoptysis, if any: No Pain issues, if any:  No  SAFETY ISSUES: Prior radiation?  Pacemaker/ICD? No   Possible current pregnancy? Hysterectomy Is the patient on methotrexate? No  Current Complaints / other details:   -Lives at home with son  -Hemodialysis- Tuesday, Thursday, and Saturday

## 2022-03-07 ENCOUNTER — Ambulatory Visit
Admission: RE | Admit: 2022-03-07 | Discharge: 2022-03-07 | Disposition: A | Discharge status: Home / Self Care | Payer: Medicare (Managed Care) | Source: Ambulatory Visit | Attending: Radiation Oncology | Admitting: Radiation Oncology

## 2022-03-07 ENCOUNTER — Other Ambulatory Visit: Payer: Self-pay

## 2022-03-07 ENCOUNTER — Encounter: Payer: Self-pay | Admitting: Radiation Oncology

## 2022-03-07 VITALS — BP 119/49 | HR 41 | Temp 97.2°F | Resp 18 | Wt 191.4 lb

## 2022-03-07 DIAGNOSIS — I120 Hypertensive chronic kidney disease with stage 5 chronic kidney disease or end stage renal disease: Secondary | ICD-10-CM | POA: Diagnosis not present

## 2022-03-07 DIAGNOSIS — M109 Gout, unspecified: Secondary | ICD-10-CM | POA: Diagnosis not present

## 2022-03-07 DIAGNOSIS — R911 Solitary pulmonary nodule: Secondary | ICD-10-CM | POA: Diagnosis not present

## 2022-03-07 DIAGNOSIS — Z87891 Personal history of nicotine dependence: Secondary | ICD-10-CM | POA: Insufficient documentation

## 2022-03-07 DIAGNOSIS — Z8673 Personal history of transient ischemic attack (TIA), and cerebral infarction without residual deficits: Secondary | ICD-10-CM | POA: Insufficient documentation

## 2022-03-07 DIAGNOSIS — G4730 Sleep apnea, unspecified: Secondary | ICD-10-CM | POA: Insufficient documentation

## 2022-03-07 DIAGNOSIS — C3432 Malignant neoplasm of lower lobe, left bronchus or lung: Secondary | ICD-10-CM

## 2022-03-07 DIAGNOSIS — K219 Gastro-esophageal reflux disease without esophagitis: Secondary | ICD-10-CM | POA: Diagnosis not present

## 2022-03-07 DIAGNOSIS — Z794 Long term (current) use of insulin: Secondary | ICD-10-CM | POA: Diagnosis not present

## 2022-03-07 DIAGNOSIS — Z8614 Personal history of Methicillin resistant Staphylococcus aureus infection: Secondary | ICD-10-CM | POA: Insufficient documentation

## 2022-03-07 DIAGNOSIS — G894 Chronic pain syndrome: Secondary | ICD-10-CM | POA: Diagnosis not present

## 2022-03-07 DIAGNOSIS — E1122 Type 2 diabetes mellitus with diabetic chronic kidney disease: Secondary | ICD-10-CM | POA: Diagnosis not present

## 2022-03-07 DIAGNOSIS — Z79899 Other long term (current) drug therapy: Secondary | ICD-10-CM | POA: Insufficient documentation

## 2022-03-07 DIAGNOSIS — Z7951 Long term (current) use of inhaled steroids: Secondary | ICD-10-CM | POA: Diagnosis not present

## 2022-03-07 DIAGNOSIS — N186 End stage renal disease: Secondary | ICD-10-CM | POA: Insufficient documentation

## 2022-03-07 DIAGNOSIS — J449 Chronic obstructive pulmonary disease, unspecified: Secondary | ICD-10-CM | POA: Insufficient documentation

## 2022-03-07 DIAGNOSIS — E039 Hypothyroidism, unspecified: Secondary | ICD-10-CM | POA: Insufficient documentation

## 2022-03-07 DIAGNOSIS — E785 Hyperlipidemia, unspecified: Secondary | ICD-10-CM | POA: Diagnosis not present

## 2022-03-07 DIAGNOSIS — I5032 Chronic diastolic (congestive) heart failure: Secondary | ICD-10-CM | POA: Insufficient documentation

## 2022-03-07 DIAGNOSIS — I130 Hypertensive heart and chronic kidney disease with heart failure and stage 1 through stage 4 chronic kidney disease, or unspecified chronic kidney disease: Secondary | ICD-10-CM | POA: Insufficient documentation

## 2022-03-09 NOTE — Progress Notes (Signed)
Radiation Oncology         (336) [phone] ________________________________  Name: [name] [name]        MRN: [id number]  Date of Service: 03/07/2022 DOB: 07/31/1951  CC:Inc, [facility]  [place], [place], [place]     REFERRING PHYSICIAN: Curt Bears, MD   DIAGNOSIS: The encounter diagnosis was Malignant neoplasm of lower lobe of left lung ([place]).   HISTORY OF PRESENT ILLNESS: [name] [name] is a 71 y.o. female seen at the request of Dr. Julien Nordmann for a new diagnosis of lung cancer.  She has been followed by Dr. Neita Goodnight in pulmonary medicine COPD and bilateral years ago was found to have a nodule in her left lung and this has been in observation.  Recently however the left lower lobe nodule enlarged from her prior imaging in 2020 measuring up to 2.8 cm and a new 5 mm right middle lobe nodule was indeterminate and potentially postinfectious.  Imaging on 12/29/2021 showed a 3 cm left lower lobe mass with SUV of 11.2, improvement in the right middle lobe nodule was noted with no evidence of FDG uptake.  No evidence of mediastinal or extrathoracic disease was noted.  Bronchoscopy on 01/23/2022 showed malignant cells consistent with squamous cell carcinoma in her left lower lobe fine-needle aspirate, her level 11 L node fine-needle aspirate showed scant cellularity but benign tissue.  Given these findings she met with Dr. Julien Nordmann as well as Dr. Kipp Brood and cardiothoracic surgery.  She is not felt to be a good surgical candidate given her pulmonary function and rather would be a better candidate for stereotactic body radiotherapy.  She is seen today to discuss this.    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   Full dentures    GERD (gastroesophageal reflux disease)    Gout    History of hiatal hernia    History of transfusion    Hx MRSA infection    abscess left groin   Hyperlipidemia    Hypertension    Hypothyroid    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HCC)    Peripheral vascular disease (HCC)    Sleep apnea    UNABLE TO TOLERATE C PAP   Stress incontinence    TIA (transient ischemic attack)     X2 NO RESIDUAL PROBLEMS   Wears glasses        PAST SURGICAL HISTORY: Past Surgical History:  Procedure Laterality Date   ABDOMINAL HYSTERECTOMY     AV FISTULA PLACEMENT Left 12/01/2021   Procedure: INSERTION OF LEFT ARM ARTERIOVENOUS (AV) GORE-TEX GRAFT;  Surgeon: Serafina Mitchell, MD;  Location: [place];  Service: Vascular;  Laterality: Left;   BACK SURGERY  7035   BASCILIC VEIN TRANSPOSITION Left 04/05/2020   Procedure: BASILIC VEIN TRANSPOSITION FIRST STAGE;  Surgeon: Angelia Mould, MD;  Location: [place];  Service: Vascular;  Laterality: Left;   BRONCHIAL BIOPSY  01/23/2022   Procedure: BRONCHIAL BIOPSIES;  Surgeon: Valeta Harms,  Octavio Graves, DO;  Location: [place] [facility];  Service: Pulmonary;;   BRONCHIAL NEEDLE ASPIRATION BIOPSY  01/23/2022   Procedure: BRONCHIAL NEEDLE ASPIRATION BIOPSIES;  Surgeon: Garner Nash, DO;  Location: [place] [facility];  Service: Pulmonary;;   COLON SURGERY  1995   DUE TO POLYP   DILATION AND CURETTAGE OF UTERUS     ENDOBRONCHIAL ULTRASOUND  01/23/2022   Procedure: ENDOBRONCHIAL ULTRASOUND;  Surgeon: Garner Nash, DO;  Location: [place] [facility];  Service: Pulmonary;;   ENTEROSCOPY N/A 07/10/2021   Procedure: ENTEROSCOPY;  Surgeon: Ronnette Juniper, MD;  Location: [place];  Service: Gastroenterology;  Laterality: N/A;   ESOPHAGOGASTRODUODENOSCOPY N/A 01/25/2021   Procedure: ESOPHAGOGASTRODUODENOSCOPY  (EGD);  Surgeon: Clarene Essex, MD;  Location: [facility];  Service: Endoscopy;  Laterality: N/A;   FINE NEEDLE ASPIRATION  01/23/2022   Procedure: FINE NEEDLE ASPIRATION (FNA) LINEAR;  Surgeon: Garner Nash, DO;  Location: [place] [facility];  Service: Pulmonary;;   FOREARM FRACTURE SURGERY     Left arm   GIVENS CAPSULE STUDY N/A 07/07/2021   Procedure: GIVENS CAPSULE STUDY;  Surgeon: Clarene Essex, MD;  Location: [place];  Service: Endoscopy;  Laterality: N/A;   HERNIA REPAIR     w/ mesh   HOT HEMOSTASIS N/A 07/10/2021   Procedure: HOT HEMOSTASIS (ARGON PLASMA COAGULATION/BICAP);  Surgeon: Ronnette Juniper, MD;  Location: [place];  Service: Gastroenterology;  Laterality: N/A;   INCISION AND DRAINAGE ABSCESS Left 02/04/2013   Procedure: INCISION AND DRAINAGE LEFT BUTTOCK ABSCESS; INCISION AND DRAINAGE LEFT BREAST ABSCESS;  Surgeon: Harl Bowie, MD;  Location: [place];  Service: General;  Laterality: Left;   INCISION AND DRAINAGE ABSCESS N/A 02/12/2013   Procedure: INCISION AND DEBRIDEMENT BUTTOCK WOUND ;  Surgeon: [name]. Georgette Dover, MD;  Location: [place];  Service: General;  Laterality: N/A;   INCISION AND DRAINAGE ABSCESS N/A 02/14/2013   Procedure: INCISION AND DRAINAGE/DRESSING CHANGE;  Surgeon: Harl Bowie, MD;  Location: [place];  Service: General;  Laterality: N/A;   INCISION AND DRAINAGE ABSCESS N/A 03/21/2015   Procedure: INCISION AND DRAINAGE PUBIC ABSCESS;  Surgeon: Excell Seltzer, MD;  Location: [facility];  Service: General;  Laterality: N/A;   INCISION AND DRAINAGE PERIRECTAL ABSCESS Left 02/10/2013   Procedure: IRRIGATION AND DEBRIDEMENT OF BUTTOCK/PERINEAL ABSCESS;  Surgeon: [name]. Georgette Dover, MD;  Location: [place];  Service: General;  Laterality: Left;   INCISION AND DRAINAGE PERIRECTAL ABSCESS N/A 02/16/2013   Procedure: IRRIGATION AND DEBRIDEMENT PERINEAL ABSCESS;  Surgeon: Zenovia Jarred, MD;  Location: [place];  Service: General;  Laterality: N/A;   IR FLUORO GUIDE CV LINE  RIGHT  11/30/2021   IR US GUIDE VASC ACCESS RIGHT  11/30/2021   IRRIGATION AND DEBRIDEMENT ABSCESS N/A 02/06/2013   Procedure: IRRIGATION AND DEBRIDEMENT BUTTOCK ABSCESS AND DRESSING CHANGE;  Surgeon: Harl Bowie, MD;  Location: [place];  Service: General;  Laterality: N/A;   IRRIGATION AND DEBRIDEMENT ABSCESS Left 02/08/2013   Procedure: IRRIGATION AND DEBRIDEMENT ABSCESS/DRESSING CHANGE;  Surgeon: Gwenyth Ober, MD;  Location: [place];  Service: General;  Laterality: Left;   JOINT REPLACEMENT  2010 / 2012    LAPAROSCOPIC CHOLECYSTECTOMY     [place] N/A 07/02/2014   Procedure: LEFT HEART CATHETERIZATION WITH CORONARY ANGIOGRAM;  Surgeon: Lorretta Harp, MD;  Location: [facility] CATH LAB;  Service: Cardiovascular;  Laterality: N/A;   LOWER EXTREMITY ANGIOGRAM Right 04/20/2016   Procedure: Lower Extremity Angiogram;  Surgeon: Elam Dutch, MD;  Location: [facility]  INVASIVE CV LAB;  Service: Cardiovascular;  Laterality: Right;   MULTIPLE TOOTH EXTRACTIONS     PERIPHERAL VASCULAR CATHETERIZATION N/A 04/20/2016   Procedure: Abdominal Aortogram;  Surgeon: Elam Dutch, MD;  Location: [place] CV LAB;  Service: Cardiovascular;  Laterality: N/A;   PERIPHERAL VASCULAR CATHETERIZATION Right 04/20/2016   Procedure: Peripheral Vascular Intervention;  Surgeon: Elam Dutch, MD;  Location: [place] CV LAB;  Service: Cardiovascular;  Laterality: Right;  popiteal   RIGHT HEART CATH N/A 10/27/2018   Procedure: RIGHT HEART CATH;  Surgeon: Larey Dresser, MD;  Location: [place] CV LAB;  Service: Cardiovascular;  Laterality: N/A;   RIGHT HEART CATH N/A 03/20/2019   Procedure: RIGHT HEART CATH;  Surgeon: Larey Dresser, MD;  Location: [place] CV LAB;  Service: Cardiovascular;  Laterality: N/A;   THYROIDECTOMY     TOTAL HIP REVISION Right 08/11/2014   Procedure: RIGHT ACETABULAR REVISION;  Surgeon: Gearlean Alf, MD;  Location: [facility];  Service: Orthopedics;   Laterality: Right;   VASCULAR SURGERY     VIDEO BRONCHOSCOPY WITH RADIAL ENDOBRONCHIAL ULTRASOUND  01/23/2022   Procedure: RADIAL ENDOBRONCHIAL ULTRASOUND;  Surgeon: Garner Nash, DO;  Location: [facility];  Service: Pulmonary;;     FAMILY HISTORY:  Family History  Problem Relation Age of Onset   Diabetes Brother    Cardiomyopathy Mother    Heart disease Mother    Cancer Father      SOCIAL HISTORY:  reports that she quit smoking about 5 months ago. Her smoking use included cigarettes. She has a 10.00 pack-year smoking history. She has quit using smokeless tobacco. She reports current alcohol use of about 1.0 standard drink per week. She reports that she does not use drugs.  The patient is single and lives in [place].  She enjoys watching TV programs.   ALLERGIES: Nebivolol, Ace inhibitors, and Morphine and related   MEDICATIONS:  Current Outpatient Medications  Medication Sig Dispense Refill   acetaminophen (TYLENOL) 500 MG tablet Take 1,000 mg by mouth 3 (three) times daily.     albuterol (VENTOLIN HFA) 108 (90 Base) MCG/ACT inhaler Inhale 2 puffs into the lungs every 6 (six) hours as needed for shortness of breath (COPD).     allopurinol (ZYLOPRIM) 100 MG tablet Take 1 tablet (100 mg total) by mouth daily. 30 tablet 0   budesonide-formoterol (SYMBICORT) 160-4.5 MCG/ACT inhaler Inhale 2 puffs into the lungs See admin instructions. Tues., Thurs, and Saturday before dialysis     carvedilol (COREG) 12.5 MG tablet Take 1 tablet (12.5 mg total) by mouth 2 (two) times daily. 60 tablet 0   clopidogrel (PLAVIX) 75 MG tablet Take 75 mg by mouth daily.     Insulin Lispro Prot & Lispro (HUMALOG 75/25 MIX) (75-25) 100 UNIT/ML Kwikpen Inject 10-20 Units into the skin See admin instructions. Take 10 units is the morning and 20 units at bedtime     levothyroxine (SYNTHROID) 200 MCG tablet Take 1 tablet (200 mcg total) by mouth daily at 6 (six) AM. 30 tablet 0   lidocaine (LIDODERM) 5 % 2  patches every 12 (twelve) hours. 12 hours off 1 patch Each side of lower back     lidocaine-prilocaine (EMLA) cream SMARTSIG:sparingly Topical 3 Times a Week     lubiprostone (AMITIZA) 24 MCG capsule Take 24 mcg by mouth 2 (two) times daily.     magnesium oxide (MAG-OX) 400 (241.3 Mg) MG tablet Take 1 tablet (400 mg total) by mouth 2 (two)  times daily. 30 tablet 0   multivitamin (RENA-VIT) TABS tablet Take 1 tablet by mouth daily.     OVER THE COUNTER MEDICATION Place 1 drop into both eyes daily as needed (Itching eye). Refresh opt lubricant eye drops     pantoprazole (PROTONIX) 40 MG tablet Take 1 tablet (40 mg total) by mouth 2 (two) times daily before a meal. (Patient taking differently: Take 40 mg by mouth 2 (two) times daily.)     polyethylene glycol (MIRALAX / GLYCOLAX) 17 g packet Take 17 g by mouth daily. 14 each 0   senna-docusate (SENOKOT-S) 8.6-50 MG tablet Take 1 tablet by mouth at bedtime as needed for moderate constipation.     sevelamer carbonate (RENVELA) 800 MG tablet Take 800 mg by mouth 3 (three) times daily with meals.     thiamine (VITAMIN B-1) 100 MG tablet Take 100 mg by mouth daily.     albuterol (PROVENTIL) (2.5 MG/3ML) 0.083% nebulizer solution Take 3 mLs (2.5 mg total) by nebulization every 6 (six) hours as needed for wheezing or shortness of breath. (Patient not taking: Reported on 02/26/2022) 75 mL 12   No current facility-administered medications for this encounter.     REVIEW OF SYSTEMS: On review of systems, the patient reports that she is doing okay, she reports shortness of breath with mild exertion at times.  She denies any productive cough or hemoptysis or new pain.  She does have chronic back feet shoulder pain that she attributes to arthritis.  No other complaints are verbalized.  PHYSICAL EXAM:  Wt Readings from Last 3 Encounters:  03/07/22 191 lb 6.4 oz (86.8 kg)  02/26/22 202 lb 6 oz (91.8 kg)  02/16/22 210 lb (95.3 kg)   Temp Readings from Last 3  Encounters:  03/07/22 (!) 97.2 F (36.2 C)  02/26/22 (!) 96.9 F (36.1 C) (Tympanic)  01/31/22 98.1 F (36.7 C) (Oral)   BP Readings from Last 3 Encounters:  03/07/22 (!) 119/49  02/26/22 (!) 123/52  02/16/22 119/75   Pulse Readings from Last 3 Encounters:  03/07/22 (!) 41  02/26/22 81  02/16/22 80   Pain Assessment Pain Score: 4  Pain Loc: Back (Feet, shoulder, back.)/10  In general this is an obese, chronically ill appearing African-American female in no acute distress.  She's alert and oriented x4 and appropriate throughout the examination. Cardiopulmonary assessment is negative for acute distress and she exhibits normal effort.     ECOG = 2  0 - Asymptomatic (Fully active, able to carry on all predisease activities without restriction)  1 - Symptomatic but completely ambulatory (Restricted in physically strenuous activity but ambulatory and able to carry out work of a light or sedentary nature. For example, light housework, office work)  2 - Symptomatic, <50% in bed during the day (Ambulatory and capable of all self care but unable to carry out any work activities. Up and about more than 50% of waking hours)  3 - Symptomatic, >50% in bed, but not bedbound (Capable of only limited self-care, confined to bed or chair 50% or more of waking hours)  4 - Bedbound (Completely disabled. Cannot carry on any self-care. Totally confined to bed or chair)  5 - Death   Eustace Pen MM, Creech RH, Tormey DC, et al. [phone]). "Toxicity and response criteria of the [facility]". [place] Oncol. 5 (6): 649-55    LABORATORY DATA:  Lab Results  Component Value Date   WBC 10.3 02/26/2022   HGB 8.6 (  L) 02/26/2022   HCT 28.0 (L) 02/26/2022   MCV 95.2 02/26/2022   PLT 254 02/26/2022   Lab Results  Component Value Date   NA 139 02/26/2022   K 4.2 02/26/2022   CL 98 02/26/2022   CO2 30 02/26/2022   Lab Results  Component Value Date   ALT 7 02/26/2022   AST 11  (L) 02/26/2022   ALKPHOS 148 (H) 02/26/2022   BILITOT 0.3 02/26/2022      RADIOGRAPHY: No results found.     IMPRESSION/PLAN: 1. Stage IA3, cT1cN0M0, NSCLC, squamous cell carcinoma of the LLL.Dr. Lisbeth Renshaw has reviewed her case and imaging as well as pathology.  Today I discussed with her the nature of early stage lung cancer.  I reviewed that the standard of care is for surgical resection. For patients who are not medical candidates to undergo surgery, stereotactic body radiotherapy (SBRT) is an appropriate alternative. We discussed the risks, benefits, short, and long term effects of radiotherapy, as well as the curative intent, and the patient is interested in proceeding. I reviewed the delivery and logistics of radiotherapy and Dr. Lisbeth Renshaw recommends 3-5 fractions of radiotherapy to the left lower lobe target. Written consent is obtained and placed in the chart, a copy was provided to the patient. The patient will be contacted to coordinate treatment planning by our simulation department.  2. RML nodule.  This will be followed in surveillance as she follows with Dr. Julien Nordmann for #1.  In a visit lasting 60 minutes, greater than 50% of the time was spent face to face discussing the patient's condition, in preparation for the discussion, and coordinating the patient's care.      Carola Rhine, [facility]   **Disclaimer: This note was dictated with voice recognition software. Similar sounding words can inadvertently be transcribed and this note may contain transcription errors which may not have been corrected upon publication of note.**

## 2022-03-14 ENCOUNTER — Encounter (HOSPITAL_COMMUNITY): Payer: Self-pay

## 2022-03-14 ENCOUNTER — Encounter: Payer: Self-pay | Admitting: *Deleted

## 2022-03-14 ENCOUNTER — Other Ambulatory Visit: Payer: Self-pay

## 2022-03-14 ENCOUNTER — Ambulatory Visit
Admission: RE | Admit: 2022-03-14 | Discharge: 2022-03-14 | Disposition: A | Discharge status: Home / Self Care | Payer: Medicare (Managed Care) | Source: Ambulatory Visit | Attending: Radiation Oncology | Admitting: Radiation Oncology

## 2022-03-14 DIAGNOSIS — C3432 Malignant neoplasm of lower lobe, left bronchus or lung: Secondary | ICD-10-CM | POA: Insufficient documentation

## 2022-03-14 DIAGNOSIS — Z510 Encounter for antineoplastic radiation therapy: Secondary | ICD-10-CM | POA: Diagnosis not present

## 2022-03-14 NOTE — Progress Notes (Signed)
Oncology Nurse Navigator Documentation     03/14/2022    3:00 PM 02/28/2022    7:00 AM 02/26/2022    4:00 PM 02/19/2022    9:00 AM  Oncology Nurse Navigator Flowsheets  Abnormal Finding Date   11/28/2021   Confirmed Diagnosis Date   01/23/2022   Diagnosis Status   Confirmed Diagnosis Complete   Planned Course of Treatment   Radiation   Phase of Treatment   Radiation   Navigator Follow Up Date:  03/07/2022 03/01/2022 02/26/2022  Navigator Follow Up Reason:  Appointment Review;Review Note Appointment Review New Patient Appointment  Navigator Location [facility]  Referral Date to RadOnc/MedOnc    02/19/2022  Navigator Encounter Type Appt/Treatment Plan Review Appt/Treatment Plan Review Initial MedOnc Other:  Patient Visit Type  Other Initial;MedOnc   Treatment Phase Pre-Tx/Tx Discussion Pre-Tx/Tx Discussion Pre-Tx/Tx Discussion Pre-Tx/Tx Discussion  Barriers/Navigation Needs Coordination of Care Coordination of Care Education Coordination of Care  Education   Newly Diagnosed Cancer Education;Other   Interventions Coordination of Care/I followed up on [name] [name]'s treatment plan and she is set up at this time.  Coordination of Care Education;Psycho-Social Support Coordination of Care  Acuity Level 2-Minimal Needs (1-2 Barriers Identified) Level 2-Minimal Needs (1-2 Barriers Identified) Level 2-Minimal Needs (1-2 Barriers Identified) Level 2-Minimal Needs (1-2 Barriers Identified)  Coordination of Care Other Other  Other  Education Method   Verbal;Other   Time Spent with Patient 15 15 30  15

## 2022-03-16 ENCOUNTER — Encounter: Payer: Self-pay | Admitting: *Deleted

## 2022-03-16 NOTE — Progress Notes (Signed)
Oncology Nurse Navigator Documentation     03/16/2022    2:00 PM 03/14/2022    3:00 PM 02/28/2022    7:00 AM 02/26/2022    4:00 PM 02/19/2022    9:00 AM  Oncology Nurse Navigator Flowsheets  Abnormal Finding Date    11/28/2021   Confirmed Diagnosis Date    01/23/2022   Diagnosis Status    Confirmed Diagnosis Complete   Planned Course of Treatment    Radiation   Phase of Treatment Radiation   Radiation   Radiation Actual Start Date: 03/28/2022      Radiation Actual End Date: 04/06/2022      Navigator Follow Up Date: 04/03/2022  03/07/2022 03/01/2022 02/26/2022  Navigator Follow Up Reason: Review Note  Appointment Review;Review Note Appointment Review New Patient Appointment  Navigator Location [facility]  Referral Date to RadOnc/MedOnc     02/19/2022  Navigator Encounter Type Appt/Treatment Plan Review Appt/Treatment Plan Review Appt/Treatment Plan Review Initial MedOnc Other:  Patient Visit Type   Other Initial;MedOnc   Treatment Phase Pre-Tx/Tx Discussion Pre-Tx/Tx Discussion Pre-Tx/Tx Discussion Pre-Tx/Tx Discussion Pre-Tx/Tx Discussion  Barriers/Navigation Needs Coordination of Care/I followed up on Ms. Aguilar's plan of care and appts. She has been re-scheduled for treatment SBRT and have follow up with med onc.  No other barriers at this time.  Coordination of Care Coordination of Care Education Coordination of Care  Education    Newly Diagnosed Cancer Education;Other   Interventions Coordination of Care Coordination of Care Coordination of Care Education;Psycho-Social Support Coordination of Care  Acuity Level 2-Minimal Needs (1-2 Barriers Identified) Level 2-Minimal Needs (1-2 Barriers Identified) Level 2-Minimal Needs (1-2 Barriers Identified) Level 2-Minimal Needs (1-2 Barriers Identified) Level 2-Minimal Needs (1-2 Barriers Identified)  Coordination of Care Other Other Other  Other  Education Method    Verbal;Other    Time Spent with Patient 30 15 15 30  15

## 2022-03-23 ENCOUNTER — Other Ambulatory Visit: Payer: Medicare (Managed Care)

## 2022-03-23 DIAGNOSIS — Z510 Encounter for antineoplastic radiation therapy: Secondary | ICD-10-CM | POA: Diagnosis not present

## 2022-03-26 ENCOUNTER — Ambulatory Visit: Payer: Medicare (Managed Care) | Admitting: Radiation Oncology

## 2022-03-28 ENCOUNTER — Ambulatory Visit
Admission: RE | Admit: 2022-03-28 | Discharge: 2022-03-28 | Disposition: A | Discharge status: Home / Self Care | Payer: Medicare (Managed Care) | Source: Ambulatory Visit | Attending: Radiation Oncology | Admitting: Radiation Oncology

## 2022-03-28 ENCOUNTER — Other Ambulatory Visit: Payer: Self-pay

## 2022-03-28 DIAGNOSIS — Z510 Encounter for antineoplastic radiation therapy: Secondary | ICD-10-CM | POA: Diagnosis not present

## 2022-03-28 LAB — RAD ONC ARIA SESSION SUMMARY
Course Elapsed Days: 0
Plan Fractions Treated to Date: 1
Plan Prescribed Dose Per Fraction: 12 Gy
Plan Total Fractions Prescribed: 5
Plan Total Prescribed Dose: 60 Gy
Reference Point Dosage Given to Date: 12 Gy
Reference Point Session Dosage Given: 12 Gy
Session Number: 1

## 2022-03-29 ENCOUNTER — Encounter: Payer: Self-pay | Admitting: *Deleted

## 2022-03-29 NOTE — Progress Notes (Signed)
I followed up on [name] [name]'s treatment plan schedule. She is scheduled for tx and follow up with med onc.

## 2022-03-30 ENCOUNTER — Ambulatory Visit: Payer: Medicare (Managed Care) | Admitting: Radiation Oncology

## 2022-03-30 ENCOUNTER — Other Ambulatory Visit: Payer: Self-pay

## 2022-03-30 ENCOUNTER — Ambulatory Visit: Payer: Medicare (Managed Care)

## 2022-03-30 ENCOUNTER — Ambulatory Visit
Admission: RE | Admit: 2022-03-30 | Discharge: 2022-03-30 | Disposition: A | Discharge status: Home / Self Care | Payer: Medicare (Managed Care) | Source: Ambulatory Visit | Attending: Radiation Oncology | Admitting: Radiation Oncology

## 2022-03-30 DIAGNOSIS — Z510 Encounter for antineoplastic radiation therapy: Secondary | ICD-10-CM | POA: Diagnosis not present

## 2022-03-30 LAB — RAD ONC ARIA SESSION SUMMARY
Course Elapsed Days: 2
Plan Fractions Treated to Date: 2
Plan Prescribed Dose Per Fraction: 12 Gy
Plan Total Fractions Prescribed: 5
Plan Total Prescribed Dose: 60 Gy
Reference Point Dosage Given to Date: 24 Gy
Reference Point Session Dosage Given: 12 Gy
Session Number: 2

## 2022-04-02 ENCOUNTER — Other Ambulatory Visit: Payer: Self-pay

## 2022-04-02 ENCOUNTER — Ambulatory Visit: Payer: Medicare (Managed Care) | Admitting: Radiation Oncology

## 2022-04-02 ENCOUNTER — Ambulatory Visit
Admission: RE | Admit: 2022-04-02 | Discharge: 2022-04-02 | Disposition: A | Discharge status: Home / Self Care | Payer: Medicare (Managed Care) | Source: Ambulatory Visit | Attending: Radiation Oncology | Admitting: Radiation Oncology

## 2022-04-02 DIAGNOSIS — Z510 Encounter for antineoplastic radiation therapy: Secondary | ICD-10-CM | POA: Diagnosis not present

## 2022-04-02 LAB — RAD ONC ARIA SESSION SUMMARY
Course Elapsed Days: 5
Plan Fractions Treated to Date: 3
Plan Prescribed Dose Per Fraction: 12 Gy
Plan Total Fractions Prescribed: 5
Plan Total Prescribed Dose: 60 Gy
Reference Point Dosage Given to Date: 36 Gy
Reference Point Session Dosage Given: 12 Gy
Session Number: 3

## 2022-04-04 ENCOUNTER — Other Ambulatory Visit: Payer: Self-pay

## 2022-04-04 ENCOUNTER — Ambulatory Visit
Admission: RE | Admit: 2022-04-04 | Discharge: 2022-04-04 | Disposition: A | Discharge status: Home / Self Care | Payer: Medicare (Managed Care) | Source: Ambulatory Visit | Attending: Radiation Oncology | Admitting: Radiation Oncology

## 2022-04-04 DIAGNOSIS — Z510 Encounter for antineoplastic radiation therapy: Secondary | ICD-10-CM | POA: Diagnosis not present

## 2022-04-04 LAB — RAD ONC ARIA SESSION SUMMARY
Course Elapsed Days: 7
Plan Fractions Treated to Date: 4
Plan Prescribed Dose Per Fraction: 12 Gy
Plan Total Fractions Prescribed: 5
Plan Total Prescribed Dose: 60 Gy
Reference Point Dosage Given to Date: 48 Gy
Reference Point Session Dosage Given: 12 Gy
Session Number: 4

## 2022-04-06 ENCOUNTER — Other Ambulatory Visit: Payer: Self-pay

## 2022-04-06 ENCOUNTER — Encounter: Payer: Self-pay | Admitting: Radiation Oncology

## 2022-04-06 ENCOUNTER — Ambulatory Visit
Admission: RE | Admit: 2022-04-06 | Discharge: 2022-04-06 | Disposition: A | Discharge status: Home / Self Care | Payer: Medicare (Managed Care) | Source: Ambulatory Visit | Attending: Radiation Oncology | Admitting: Radiation Oncology

## 2022-04-06 DIAGNOSIS — Z510 Encounter for antineoplastic radiation therapy: Secondary | ICD-10-CM | POA: Diagnosis not present

## 2022-04-06 LAB — RAD ONC ARIA SESSION SUMMARY
Course Elapsed Days: 9
Plan Fractions Treated to Date: 5
Plan Prescribed Dose Per Fraction: 12 Gy
Plan Total Fractions Prescribed: 5
Plan Total Prescribed Dose: 60 Gy
Reference Point Dosage Given to Date: 60 Gy
Reference Point Session Dosage Given: 12 Gy
Session Number: 5

## 2022-04-13 ENCOUNTER — Other Ambulatory Visit: Payer: Medicare (Managed Care)

## 2022-04-16 ENCOUNTER — Encounter (HOSPITAL_COMMUNITY): Payer: Self-pay

## 2022-04-16 NOTE — Progress Notes (Signed)
                                                                                                                                                             Patient Name: [name] [name] MRN: [id number] DOB: 03/19/1951 Referring Physician: Curt Bears (Profile Not Attached) Date of Service: 04/06/2022 [facility], [place]                                                        End Of Treatment Note  Diagnoses: C34.32-Malignant neoplasm of lower lobe, left bronchus or lung  Cancer Staging: Stage IA3, cT1cN0M0, NSCLC, squamous cell carcinoma of the LLL  Intent: Curative  Radiation Treatment Dates: 03/28/2022 through 04/06/2022 Site Technique Total Dose (Gy) Dose per Fx (Gy) Completed Fx Beam Energies  Lung, Left: Lung_L IMRT 60/60 12 5/5 6XFFF   Narrative: The patient tolerated radiation therapy relatively well.   Plan: The patient will receive a call in about one month from the radiation oncology department. She will continue follow up with Dr. Julien Nordmann.  ________________________________________________    Carola Rhine, [facility]

## 2022-04-18 ENCOUNTER — Ambulatory Visit: Payer: Medicare (Managed Care) | Admitting: Pulmonary Disease

## 2022-04-26 ENCOUNTER — Telehealth: Payer: Self-pay | Admitting: Internal Medicine

## 2022-04-26 ENCOUNTER — Ambulatory Visit
Admission: RE | Admit: 2022-04-26 | Discharge: 2022-04-26 | Disposition: A | Discharge status: Home / Self Care | Payer: Medicare (Managed Care) | Source: Ambulatory Visit | Attending: Nurse Practitioner | Admitting: Nurse Practitioner

## 2022-04-26 DIAGNOSIS — C3490 Malignant neoplasm of unspecified part of unspecified bronchus or lung: Secondary | ICD-10-CM

## 2022-04-26 NOTE — Telephone Encounter (Signed)
Called patient regarding upcoming September appointments, patient has been called and notified.

## 2022-04-30 ENCOUNTER — Telehealth: Payer: Self-pay | Admitting: Internal Medicine

## 2022-04-30 NOTE — Telephone Encounter (Signed)
Called patient regarding upcoming September appointments, patient has been called and notified.

## 2022-05-02 ENCOUNTER — Ambulatory Visit: Payer: Medicare (Managed Care) | Admitting: Pulmonary Disease

## 2022-05-08 ENCOUNTER — Ambulatory Visit (INDEPENDENT_AMBULATORY_CARE_PROVIDER_SITE_OTHER): Payer: Medicare (Managed Care) | Admitting: Pulmonary Disease

## 2022-05-08 DIAGNOSIS — C3490 Malignant neoplasm of unspecified part of unspecified bronchus or lung: Secondary | ICD-10-CM | POA: Diagnosis not present

## 2022-05-08 LAB — PULMONARY FUNCTION TEST
DL/VA % pred: 65 %
DL/VA: 2.71 ml/min/mmHg/L
DLCO cor % pred: 50 %
DLCO cor: 9.41 ml/min/mmHg
DLCO unc % pred: 50 %
DLCO unc: 9.41 ml/min/mmHg
FEF 25-75 Post: 1.06 L/sec
FEF 25-75 Pre: 0.98 L/sec
FEF2575-%Change-Post: 7 %
FEF2575-%Pred-Post: 58 %
FEF2575-%Pred-Pre: 54 %
FEV1-%Change-Post: 1 %
FEV1-%Pred-Post: 55 %
FEV1-%Pred-Pre: 54 %
FEV1-Post: 1.19 L
FEV1-Pre: 1.17 L
FEV1FVC-%Change-Post: 3 %
FEV1FVC-%Pred-Pre: 102 %
FEV6-%Change-Post: -2 %
FEV6-%Pred-Post: 54 %
FEV6-%Pred-Pre: 55 %
FEV6-Post: 1.48 L
FEV6-Pre: 1.52 L
FEV6FVC-%Pred-Post: 104 %
FEV6FVC-%Pred-Pre: 104 %
FVC-%Change-Post: -2 %
FVC-%Pred-Post: 52 %
FVC-%Pred-Pre: 53 %
FVC-Post: 1.48 L
FVC-Pre: 1.52 L
Post FEV1/FVC ratio: 80 %
Post FEV6/FVC ratio: 100 %
Pre FEV1/FVC ratio: 77 %
Pre FEV6/FVC Ratio: 100 %
RV % pred: 97 %
RV: 2.12 L
TLC % pred: 76 %
TLC: 3.74 L

## 2022-05-08 NOTE — Progress Notes (Signed)
PFT done today. 

## 2022-05-09 ENCOUNTER — Ambulatory Visit
Admission: RE | Admit: 2022-05-09 | Discharge: 2022-05-09 | Disposition: A | Discharge status: Home / Self Care | Payer: Medicare (Managed Care) | Source: Ambulatory Visit | Attending: Nurse Practitioner | Admitting: Nurse Practitioner

## 2022-05-09 MED ORDER — GADOPENTETATE DIMEGLUMINE 469.01 MG/ML IV SOLN
20.0000 mL | Freq: Once | INTRAVENOUS | Status: AC | PRN
  Administered 2022-05-09: 20 mL via INTRAVENOUS

## 2022-05-17 ENCOUNTER — Ambulatory Visit: Payer: Medicare (Managed Care) | Admitting: Pulmonary Disease

## 2022-05-21 ENCOUNTER — Ambulatory Visit
Admission: RE | Admit: 2022-05-21 | Discharge: 2022-05-21 | Disposition: A | Discharge status: Home / Self Care | Payer: Medicare (Managed Care) | Source: Ambulatory Visit | Attending: Radiation Oncology | Admitting: Radiation Oncology

## 2022-05-21 DIAGNOSIS — C3432 Malignant neoplasm of lower lobe, left bronchus or lung: Secondary | ICD-10-CM

## 2022-05-21 NOTE — Progress Notes (Signed)
  Radiation Oncology         (336) [phone] ________________________________  Name: [name] [name] MRN: [id number]  Date of Service: 05/21/2022  DOB: [birth year]  Post Treatment Telephone Note  Diagnosis:   Stage IA3, cT1cN0M0, NSCLC, squamous cell carcinoma of the LLL  Intent: Curative  Radiation Treatment Dates:  03/28/2022 through 04/06/2022 SBRT Treatment Site Technique Total Dose (Gy) Dose per Fx (Gy) Completed Fx Beam Energies  Lung, Left: Lung_L IMRT 60/60 12 5/5 6XFFF   Narrative: The patient tolerated radiation therapy relatively well. She's doing well today. She is hopeful for good results on her next set of scans.   Impression/Plan: 1. Stage IA3, cT1cN0M0, NSCLC, squamous cell carcinoma of the LLL. The patient has been doing well since completion of radiotherapy. We discussed that we would be happy to continue to follow her as needed, but she will also continue to follow up with Dr. Julien Nordmann in medical oncology.       Carola Rhine, PAC

## 2022-06-04 ENCOUNTER — Ambulatory Visit (INDEPENDENT_AMBULATORY_CARE_PROVIDER_SITE_OTHER): Payer: Medicare (Managed Care) | Admitting: Primary Care

## 2022-06-04 ENCOUNTER — Encounter: Payer: Self-pay | Admitting: Primary Care

## 2022-06-04 DIAGNOSIS — C3432 Malignant neoplasm of lower lobe, left bronchus or lung: Secondary | ICD-10-CM

## 2022-06-04 DIAGNOSIS — J449 Chronic obstructive pulmonary disease, unspecified: Secondary | ICD-10-CM | POA: Diagnosis not present

## 2022-06-04 NOTE — Patient Instructions (Addendum)
MRI of the brain did not show any metastatic disease Make sure you have CT of your chest in September with Dr. Julien Nordmann  Recommendations: Take Symbicort every day - 2 puff morning and evening (rinse mouth after use)  Follow-up 3-4 months with Dr. Valeta Harms or sooner if needed

## 2022-06-04 NOTE — Assessment & Plan Note (Addendum)
-   Patient was not considered a good surgical candidate per Dr. Kipp Brood. She completed radiation as curative option with Dr. Lisbeth Renshaw. MRI brian negative for metastasis  - Scheduled for follow-up CT chest in September with Dr. Julien Nordmann then every 6 months for surveillance

## 2022-06-04 NOTE — Progress Notes (Signed)
@Patient  ID: [name] [name], female    DOB: [birth year], 71 y.o.   MRN: [id number]  Chief Complaint  Patient presents with   Follow-up    Referring provider: [facility]  HPI: 71 year old female, former smoker (40-year history) followed for lung nodule, COPD with asthma.  She is a patient of Dr. Juline Patch and last seen in office 01/03/2022.  Past medical history significant for PAD, TIA, combined CHF, hypertension, OSA, DM 2, hypothyroidism, ESRD on HD, obesity.  Previously followed left lower lobe pulmonary nodule that was first identified in 2020.  Patient was recently admitted in February 2023 and repeat CT scan of the chest showed a 2.8 x 1.8 cm left lower lobe pulmonary nodule, concerning for primary lung cancer.  She had also reported recent weight loss.  She was seen by Dr. Valeta Harms in the hospital for consultation who recommended PET scan.  PET scan completed showed a hypermetabolic 3 cm left lower lobe mass with SUV max of 11.2, concerning for malignancy.  She was recommended for bronchoscopy with biopsy which was completed on 01/23/2022.  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  The local lymph node that was biopsied as well was negative for malignant cells.   01/31/2022 Patient presents today for follow-up to discuss bronchoscopy result with cytology that showed malignant cells consistent with squamous carcinoma.  Reports that she has recovered well since bronchoscopy.  Initially was coughing up some blood but this is stopped.  Still has an occasional minimal cough but feels as though it is improving.  Breathing is at her baseline.  Denies any recent fevers, night sweats, lower extremity edema, wheezing.  Overall feels like she is doing relatively well.   06/04/2022- Interim hx  Patient presents today for follow-up/review PFTs.  She is doing well today without significant respiratory complaints.  She is not taking Symbicort  consistently, she tells me that was ordered to be used only on dialysis days. She has some mild breakthrough wheezing and cough related to postnasal drip.  She was not a surgical candidate per Dr. Kipp Brood.  She completed SBRT with Dr. Lisbeth Renshaw as curative treatment. She has follow-up CT chest scheduled in September with Dr. Julien Nordmann.    Pulmonary function testing 05/08/22 PFT>> FVC 1.48 (52%), FEV1 1.19 (55%), ratio 80, TLC 76%, DLCO 9.41 (50%)    TEST/EVENTS:  11/28/2021 CT chest without contrast: There is atherosclerosis present with CAD as well.  No LAD identified.  There is a right trace pleural effusion.  Mild dependent atelectasis in the lower lobes.  There is a 2.8 x 1.8 cm nodule in the left lower lobe, greatly enlarged from 2020 when it measured 1.2 cm.  There is associated mild spiculation and bandlike densities extend from the nodule posterolaterally to the pleura anteriorly or to the major fissure.  Nodules concerning for primary lung malignancy.  A 5 mm nodule in the right middle lobe is new.  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  Chronic bilateral adrenal enlargement is present as well.  There are also old rib fractures and a T3 vertebral body fracture. 12/29/2021 PET scan: There is bilateral common carotid atherosclerosis present.  The 3.0 x 2.4 cm left lower lobe mass has a maximum SUV of 11.2, highly suspicious for malignancy.  There is reduced conspicuity of the subsolid right middle lobe nodule, with no definite abnormal hypermetabolic activity.  There was a small cutaneous/subcutaneous focus of activity along  the left axilla, maximum SUV of 2.9, probably benign.   Allergies  Allergen Reactions   Nebivolol Swelling    Chest pain (reaction to Bystolic)   Ace Inhibitors Swelling and Other (See Comments)    Tongue swell   Morphine And Related Itching    Immunization History  Administered Date(s) Administered   PFIZER(Purple Top)SARS-COV-2  Vaccination 06/16/2020    Past Medical History:  Diagnosis Date   Anemia    Arthritis    Asthma    Chronic diastolic (congestive) heart failure (HCC)    Chronic kidney disease    Dialysis T-TH-Sat   Chronic pain syndrome    Class 2 obesity due to excess calories with body mass index (BMI) of 36.0 to 36.9 in adult    COPD (chronic obstructive pulmonary disease) (HCC)    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   Full dentures    GERD (gastroesophageal reflux disease)    Gout    History of hiatal hernia    History of transfusion    Hx MRSA infection    abscess left groin   Hyperlipidemia    Hypertension    Hypothyroid    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HCC)    Peripheral vascular disease (HCC)    Sleep apnea    UNABLE TO TOLERATE C PAP   Stress incontinence    TIA (transient ischemic attack)     X2 NO RESIDUAL PROBLEMS   Wears glasses     Tobacco History: Social History   Tobacco Use  Smoking Status Former   Packs/day: 0.25   Years: 40.00   Total pack years: 10.00   Types: Cigarettes   Quit date: 10/2021   Years since quitting: 0.6   Passive exposure: Past  Smokeless Tobacco Former   Counseling given: Not Answered   Outpatient Medications Prior to Visit  Medication Sig Dispense Refill   acetaminophen (TYLENOL) 500 MG tablet Take 1,000 mg by mouth 3 (three) times daily.     albuterol (PROVENTIL) (2.5 MG/3ML) 0.083% nebulizer solution Take 3 mLs (2.5 mg total) by nebulization every 6 (six) hours as needed for wheezing or shortness of breath. 75 mL 12   albuterol (VENTOLIN HFA) 108 (90 Base) MCG/ACT inhaler Inhale 2 puffs into the lungs every 6 (six) hours as needed for shortness of breath (COPD).     allopurinol (ZYLOPRIM) 100 MG tablet Take 1 tablet (100 mg total) by mouth daily. 30 tablet 0   budesonide-formoterol (SYMBICORT) 160-4.5 MCG/ACT inhaler Inhale 2 puffs into the lungs See admin instructions. Tues., Thurs, and Saturday  before dialysis     carvedilol (COREG) 12.5 MG tablet Take 1 tablet (12.5 mg total) by mouth 2 (two) times daily. 60 tablet 0   clopidogrel (PLAVIX) 75 MG tablet Take 75 mg by mouth daily.     Insulin Lispro Prot & Lispro (HUMALOG 75/25 MIX) (75-25) 100 UNIT/ML Kwikpen Inject 10-20 Units into the skin See admin instructions. Take 10 units is the morning and 20 units at bedtime     levothyroxine (SYNTHROID) 200 MCG tablet Take 1 tablet (200 mcg total) by mouth daily at 6 (six) AM. 30 tablet 0   lidocaine (LIDODERM) 5 % 2 patches every 12 (twelve) hours. 12 hours off 1 patch Each side of lower back     lidocaine-prilocaine (EMLA) cream SMARTSIG:sparingly Topical 3 Times a Week     lubiprostone (AMITIZA) 24 MCG capsule Take 24 mcg by mouth 2 (two) times daily.  magnesium oxide (MAG-OX) 400 (241.3 Mg) MG tablet Take 1 tablet (400 mg total) by mouth 2 (two) times daily. 30 tablet 0   multivitamin (RENA-VIT) TABS tablet Take 1 tablet by mouth daily.     OVER THE COUNTER MEDICATION Place 1 drop into both eyes daily as needed (Itching eye). Refresh opt lubricant eye drops     pantoprazole (PROTONIX) 40 MG tablet Take 1 tablet (40 mg total) by mouth 2 (two) times daily before a meal. (Patient taking differently: Take 40 mg by mouth 2 (two) times daily.)     polyethylene glycol (MIRALAX / GLYCOLAX) 17 g packet Take 17 g by mouth daily. 14 each 0   senna-docusate (SENOKOT-S) 8.6-50 MG tablet Take 1 tablet by mouth at bedtime as needed for moderate constipation.     sevelamer carbonate (RENVELA) 800 MG tablet Take 800 mg by mouth 3 (three) times daily with meals.     thiamine (VITAMIN B-1) 100 MG tablet Take 100 mg by mouth daily.     No facility-administered medications prior to visit.   Review of Systems  Review of Systems  Constitutional: Negative.   HENT: Negative.    Respiratory:  Positive for cough.   Cardiovascular: Negative.      Physical Exam  BP 128/60 (BP Location: Right Arm,  Patient Position: Sitting, Cuff Size: Large)   Pulse 89   Temp 98.7 F (37.1 C) (Oral)   Ht 5\' 3"  (1.6 m)   Wt 225 lb 9.6 oz (102.3 kg)   SpO2 92%   BMI 39.96 kg/m  Physical Exam Constitutional:      General: She is not in acute distress.    Appearance: Normal appearance. She is not ill-appearing.  HENT:     Head: Normocephalic and atraumatic.     Mouth/Throat:     Mouth: Mucous membranes are moist.     Pharynx: Oropharynx is clear.  Cardiovascular:     Rate and Rhythm: Normal rate and regular rhythm.     Comments: + BLE edema Pulmonary:     Effort: Pulmonary effort is normal.     Breath sounds: No wheezing, rhonchi or rales.  Musculoskeletal:     Cervical back: Normal range of motion and neck supple.  Skin:    General: Skin is warm and dry.  Neurological:     General: No focal deficit present.     Mental Status: She is alert and oriented to person, place, and time. Mental status is at baseline.  Psychiatric:        Mood and Affect: Mood normal.        Behavior: Behavior normal.        Thought Content: Thought content normal.        Judgment: Judgment normal.      Lab Results:  CBC    Component Value Date/Time   WBC 10.3 02/26/2022 1401   WBC 13.1 (H) 12/07/2021 0630   RBC 2.94 (L) 02/26/2022 1401   HGB 8.6 (L) 02/26/2022 1401   HCT 28.0 (L) 02/26/2022 1401   HCT 24.6 (L) 10/17/2020 1235   PLT 254 02/26/2022 1401   MCV 95.2 02/26/2022 1401   MCH 29.3 02/26/2022 1401   MCHC 30.7 02/26/2022 1401   RDW 16.6 (H) 02/26/2022 1401   RDW 15.7 (H) 12/23/2013 1132   LYMPHSABS 1.4 02/26/2022 1401   LYMPHSABS 2.5 12/23/2013 1132   MONOABS 0.9 02/26/2022 1401   EOSABS 0.1 02/26/2022 1401   EOSABS 0.1 12/23/2013 1132   BASOSABS  0.0 02/26/2022 1401   BASOSABS 0.0 12/23/2013 1132    BMET    Component Value Date/Time   NA 139 02/26/2022 1401   NA 146 (H) 12/23/2013 1132   K 4.2 02/26/2022 1401   CL 98 02/26/2022 1401   CO2 30 02/26/2022 1401   GLUCOSE 163 (H)  02/26/2022 1401   BUN 77 (H) 02/26/2022 1401   BUN 31 (H) 12/23/2013 1132   CREATININE 5.95 (HH) 02/26/2022 1401   CREATININE 1.32 (H) 06/29/2014 1100   CALCIUM 10.0 02/26/2022 1401   CALCIUM 9.1 09/07/2021 1036   GFRNONAA 7 (L) 02/26/2022 1401   GFRAA 15 (L) 04/22/2020 1129    BNP    Component Value Date/Time   BNP 854.0 (H) 07/06/2021 0110    ProBNP    Component Value Date/Time   PROBNP 449.3 (H) 08/11/2014 2113    Imaging: MR BRAIN W WO CONTRAST  Result Date: 05/09/2022 CLINICAL DATA:  Small cell lung cancer staging. EXAM: MRI HEAD WITHOUT AND WITH CONTRAST TECHNIQUE: Multiplanar, multiecho pulse sequences of the brain and surrounding structures were obtained without and with intravenous contrast. CONTRAST:  60mL MAGNEVIST GADOPENTETATE DIMEGLUMINE 469.01 MG/ML IV SOLN COMPARISON:  Head CT 11/21/2021 FINDINGS: The study suffers from up to moderate motion throughout. Brain: There is no evidence of an acute infarct, intracranial hemorrhage, mass, midline shift, or extra-axial fluid collection. T2 hyperintensities in the cerebral white matter are nonspecific but compatible with mild chronic small vessel ischemic disease. A chronic lacunar infarct is again noted in the right lentiform nucleus. There is mild cerebral atrophy. No abnormal enhancement is identified, however the sensitivity for detection of very small lesions is reduced by the degree of motion artifact. Vascular: Major intracranial vascular flow voids are preserved. Skull and upper cervical spine: No suspicious marrow lesion. Sinuses/Orbits: Bilateral cataract extraction. Paranasal sinuses and mastoid air cells are clear. Other: None. IMPRESSION: 1. No evidence of intracranial metastases on this motion degraded study. 2. Mild chronic small vessel ischemic disease. Electronically Signed   By: Logan Bores M.D.   On: 05/09/2022 12:38     Assessment & Plan:   COPD with asthma [facility]) - Patient is minimally symptomatic. She has  some breakthrough wheezing and cough. Pulmonary function testing showed moderate restriction with moderate diffusion defect. No BD response. Curvature on flow volume loop consistent with airway obstruction. She is only using Symbicort on dialysis days. Before escalating therapy recommend patient start using Symbicort 160 two puffs twice daily.   Lung cancer, lower lobe ([place]) - Patient was not considered a good surgical candidate per Dr. Kipp Brood. She completed radiation as curative option with Dr. Lisbeth Renshaw. MRI brian negative for metastasis  - Scheduled for follow-up CT chest in September with Dr. Julien Nordmann then every 6 months for surveillance    Martyn Ehrich, NP 06/04/2022

## 2022-06-04 NOTE — Assessment & Plan Note (Signed)
-   Patient is minimally symptomatic. She has some breakthrough wheezing and cough. Pulmonary function testing showed moderate restriction with moderate diffusion defect. No BD response. Curvature on flow volume loop consistent with airway obstruction. She is only using Symbicort on dialysis days. Before escalating therapy recommend patient start using Symbicort 160 two puffs twice daily.

## 2022-06-19 ENCOUNTER — Encounter (HOSPITAL_COMMUNITY): Payer: Self-pay

## 2022-06-20 ENCOUNTER — Other Ambulatory Visit: Payer: Self-pay | Admitting: Infectious Diseases

## 2022-06-20 DIAGNOSIS — Z1231 Encounter for screening mammogram for malignant neoplasm of breast: Secondary | ICD-10-CM

## 2022-06-25 ENCOUNTER — Ambulatory Visit (HOSPITAL_COMMUNITY)
Admission: RE | Admit: 2022-06-25 | Discharge: 2022-06-25 | Disposition: A | Discharge status: Home / Self Care | Payer: 59 | Source: Ambulatory Visit | Attending: Internal Medicine | Admitting: Internal Medicine

## 2022-06-25 ENCOUNTER — Other Ambulatory Visit: Payer: Self-pay

## 2022-06-25 ENCOUNTER — Inpatient Hospital Stay: Payer: 59 | Attending: Internal Medicine

## 2022-06-25 ENCOUNTER — Telehealth: Payer: Self-pay

## 2022-06-25 DIAGNOSIS — G894 Chronic pain syndrome: Secondary | ICD-10-CM | POA: Diagnosis not present

## 2022-06-25 DIAGNOSIS — Z923 Personal history of irradiation: Secondary | ICD-10-CM | POA: Insufficient documentation

## 2022-06-25 DIAGNOSIS — R140 Abdominal distension (gaseous): Secondary | ICD-10-CM | POA: Diagnosis not present

## 2022-06-25 DIAGNOSIS — Z794 Long term (current) use of insulin: Secondary | ICD-10-CM | POA: Insufficient documentation

## 2022-06-25 DIAGNOSIS — M109 Gout, unspecified: Secondary | ICD-10-CM | POA: Diagnosis not present

## 2022-06-25 DIAGNOSIS — G4730 Sleep apnea, unspecified: Secondary | ICD-10-CM | POA: Diagnosis not present

## 2022-06-25 DIAGNOSIS — Z79890 Hormone replacement therapy (postmenopausal): Secondary | ICD-10-CM | POA: Diagnosis not present

## 2022-06-25 DIAGNOSIS — E039 Hypothyroidism, unspecified: Secondary | ICD-10-CM | POA: Diagnosis not present

## 2022-06-25 DIAGNOSIS — R918 Other nonspecific abnormal finding of lung field: Secondary | ICD-10-CM | POA: Insufficient documentation

## 2022-06-25 DIAGNOSIS — I132 Hypertensive heart and chronic kidney disease with heart failure and with stage 5 chronic kidney disease, or end stage renal disease: Secondary | ICD-10-CM | POA: Insufficient documentation

## 2022-06-25 DIAGNOSIS — E1122 Type 2 diabetes mellitus with diabetic chronic kidney disease: Secondary | ICD-10-CM | POA: Diagnosis not present

## 2022-06-25 DIAGNOSIS — Z79899 Other long term (current) drug therapy: Secondary | ICD-10-CM | POA: Diagnosis not present

## 2022-06-25 DIAGNOSIS — K219 Gastro-esophageal reflux disease without esophagitis: Secondary | ICD-10-CM | POA: Insufficient documentation

## 2022-06-25 DIAGNOSIS — E1151 Type 2 diabetes mellitus with diabetic peripheral angiopathy without gangrene: Secondary | ICD-10-CM | POA: Insufficient documentation

## 2022-06-25 DIAGNOSIS — Z8673 Personal history of transient ischemic attack (TIA), and cerebral infarction without residual deficits: Secondary | ICD-10-CM | POA: Insufficient documentation

## 2022-06-25 DIAGNOSIS — M47814 Spondylosis without myelopathy or radiculopathy, thoracic region: Secondary | ICD-10-CM | POA: Diagnosis not present

## 2022-06-25 DIAGNOSIS — Z8614 Personal history of Methicillin resistant Staphylococcus aureus infection: Secondary | ICD-10-CM | POA: Insufficient documentation

## 2022-06-25 DIAGNOSIS — J432 Centrilobular emphysema: Secondary | ICD-10-CM | POA: Insufficient documentation

## 2022-06-25 DIAGNOSIS — Z85118 Personal history of other malignant neoplasm of bronchus and lung: Secondary | ICD-10-CM | POA: Insufficient documentation

## 2022-06-25 DIAGNOSIS — C3490 Malignant neoplasm of unspecified part of unspecified bronchus or lung: Secondary | ICD-10-CM | POA: Diagnosis present

## 2022-06-25 DIAGNOSIS — E785 Hyperlipidemia, unspecified: Secondary | ICD-10-CM | POA: Insufficient documentation

## 2022-06-25 DIAGNOSIS — I5032 Chronic diastolic (congestive) heart failure: Secondary | ICD-10-CM | POA: Diagnosis not present

## 2022-06-25 LAB — CMP (CANCER CENTER ONLY)
ALT: 9 U/L (ref 0–44)
AST: 14 U/L — ABNORMAL LOW (ref 15–41)
Albumin: 3.8 g/dL (ref 3.5–5.0)
Alkaline Phosphatase: 171 U/L — ABNORMAL HIGH (ref 38–126)
Anion gap: 13 (ref 5–15)
BUN: 53 mg/dL — ABNORMAL HIGH (ref 8–23)
CO2: 28 mmol/L (ref 22–32)
Calcium: 10.2 mg/dL (ref 8.9–10.3)
Chloride: 99 mmol/L (ref 98–111)
Creatinine: 5.13 mg/dL (ref 0.44–1.00)
GFR, Estimated: 8 mL/min — ABNORMAL LOW (ref >60)
Glucose, Bld: 171 mg/dL — ABNORMAL HIGH (ref 70–99)
Potassium: 4.4 mmol/L (ref 3.5–5.1)
Sodium: 140 mmol/L (ref 135–145)
Total Bilirubin: 0.3 mg/dL (ref 0.3–1.2)
Total Protein: 6.8 g/dL (ref 6.5–8.1)

## 2022-06-25 LAB — CBC WITH DIFFERENTIAL (CANCER CENTER ONLY)
Abs Immature Granulocytes: 0.05 10*3/uL (ref 0.00–0.07)
Basophils Absolute: 0 10*3/uL (ref 0.0–0.1)
Basophils Relative: 0 %
Eosinophils Absolute: 0.1 10*3/uL (ref 0.0–0.5)
Eosinophils Relative: 1 %
HCT: 34.8 % — ABNORMAL LOW (ref 36.0–46.0)
Hemoglobin: 10.3 g/dL — ABNORMAL LOW (ref 12.0–15.0)
Immature Granulocytes: 1 %
Lymphocytes Relative: 10 %
Lymphs Abs: 1 10*3/uL (ref 0.7–4.0)
MCH: 29.3 pg (ref 26.0–34.0)
MCHC: 29.6 g/dL — ABNORMAL LOW (ref 30.0–36.0)
MCV: 99.1 fL (ref 80.0–100.0)
Monocytes Absolute: 0.8 10*3/uL (ref 0.1–1.0)
Monocytes Relative: 8 %
Neutro Abs: 7.6 10*3/uL (ref 1.7–7.7)
Neutrophils Relative %: 80 %
Platelet Count: 227 10*3/uL (ref 150–400)
RBC: 3.51 MIL/uL — ABNORMAL LOW (ref 3.87–5.11)
RDW: 17 % — ABNORMAL HIGH (ref 11.5–15.5)
WBC Count: 9.5 10*3/uL (ref 4.0–10.5)
nRBC: 0 % (ref 0.0–0.2)

## 2022-06-25 NOTE — Telephone Encounter (Signed)
CRITICAL VALUE STICKER  CRITICAL VALUE: Creatinine = 5.13  RECEIVER (on-site recipient of call): Yetta Glassman, CMA  DATE & TIME NOTIFIED: 06/25/22 at 10:55am  MESSENGER (representative from lab): Heather  MD NOTIFIED: [facility]  TIME OF NOTIFICATION: 06/25/22 at 10:58am  RESPONSE: Notificaiton provided to diane, RN for follow-up with provider. Records indicate pt has CKD Stage IV .

## 2022-06-27 ENCOUNTER — Inpatient Hospital Stay (HOSPITAL_BASED_OUTPATIENT_CLINIC_OR_DEPARTMENT_OTHER): Payer: 59 | Admitting: Internal Medicine

## 2022-06-27 ENCOUNTER — Other Ambulatory Visit: Payer: Self-pay

## 2022-06-27 ENCOUNTER — Encounter: Payer: Self-pay | Admitting: Internal Medicine

## 2022-06-27 VITALS — BP 140/53 | HR 100 | Temp 98.4°F | Resp 16 | Wt 221.9 lb

## 2022-06-27 DIAGNOSIS — Z85118 Personal history of other malignant neoplasm of bronchus and lung: Secondary | ICD-10-CM | POA: Diagnosis not present

## 2022-06-27 DIAGNOSIS — C3490 Malignant neoplasm of unspecified part of unspecified bronchus or lung: Secondary | ICD-10-CM | POA: Diagnosis not present

## 2022-06-27 NOTE — Progress Notes (Signed)
[place] [phone]   [phone]  OFFICE PROGRESS NOTE  Antony Blackbird, MD [address] [place] [address]  DIAGNOSIS: Stage IA (T1c, N0, M0) non-small cell lung cancer, squamous cell carcinoma presented with left lower lobe lung mass diagnosed in April 2023.  PRIOR THERAPY: Status post curative radiotherapy to the left lower lobe lung mass under the care of Dr. Lisbeth Renshaw completed on April 06, 2022.  CURRENT THERAPY: Observation.  INTERVAL HISTORY: [name] [name] 71 y.o. female returns to the clinic today for follow-up visit.  The patient underwent curative radiotherapy to the left lower lobe lung mass under the care of Dr. Lisbeth Renshaw completed on April 06, 2022 and she has been feeling fine since that time except for pain on the right side of the chest developed few days ago and most of the time secondary to abdominal bloating.  She denied having any shortness of breath, cough or hemoptysis.  She has no nausea, vomiting, diarrhea or constipation.  She has no headache or visual changes.  She denied having any recent weight loss or night sweats.  She is here today for evaluation with repeat CT scan of the chest for restaging of her disease.  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  MEDICATIONS:  Current Outpatient Medications  Medication Sig Dispense Refill   acetaminophen (TYLENOL) 500 MG tablet Take 1,000 mg by mouth 3 (three) times daily.     albuterol (PROVENTIL) (2.5 MG/3ML) 0.083% nebulizer solution Take 3 mLs (2.5 mg total) by nebulization every 6 (six) hours as needed for wheezing or shortness of breath. 75 mL 12   albuterol (VENTOLIN HFA) 108 (90 Base) MCG/ACT inhaler Inhale 2 puffs into the lungs every 6 (six) hours as needed for shortness of breath (COPD).     allopurinol (ZYLOPRIM) 100 MG tablet Take 1 tablet (100 mg total) by mouth daily. 30 tablet 0   budesonide-formoterol (SYMBICORT) 160-4.5 MCG/ACT inhaler Inhale 2 puffs into the lungs See admin instructions. Tues., Thurs, and Saturday before dialysis     carvedilol (COREG) 12.5 MG tablet Take 1 tablet (12.5 mg total) by mouth 2 (two) times daily. 60 tablet 0   clopidogrel (PLAVIX) 75 MG tablet Take 75 mg by mouth daily.     Insulin Lispro Prot &  Lispro (HUMALOG 75/25 MIX) (75-25) 100 UNIT/ML Kwikpen Inject 10-20 Units into the skin See admin instructions. Take 10 units is the morning and 20 units at bedtime     levothyroxine (SYNTHROID) 200 MCG tablet Take 1 tablet (200 mcg total) by mouth daily at 6 (six) AM. 30 tablet 0   lidocaine (LIDODERM) 5 % 2 patches every 12 (twelve) hours. 12 hours off 1 patch Each side of lower back     lidocaine-prilocaine (EMLA) cream SMARTSIG:sparingly Topical 3 Times a Week     lubiprostone (AMITIZA) 24 MCG capsule Take 24 mcg by mouth 2 (two) times daily.     magnesium oxide (MAG-OX) 400 (241.3 Mg) MG tablet Take 1 tablet (400 mg total) by mouth 2 (two) times daily. 30 tablet 0   multivitamin (RENA-VIT) TABS  tablet Take 1 tablet by mouth daily.     OVER THE COUNTER MEDICATION Place 1 drop into both eyes daily as needed (Itching eye). Refresh opt lubricant eye drops     pantoprazole (PROTONIX) 40 MG tablet Take 1 tablet (40 mg total) by mouth 2 (two) times daily before a meal. (Patient taking differently: Take 40 mg by mouth 2 (two) times daily.)     polyethylene glycol (MIRALAX / GLYCOLAX) 17 g packet Take 17 g by mouth daily. 14 each 0   senna-docusate (SENOKOT-S) 8.6-50 MG tablet Take 1 tablet by mouth at bedtime as needed for moderate constipation.     sevelamer carbonate (RENVELA) 800 MG tablet Take 800 mg by mouth 3 (three) times daily with meals.     thiamine (VITAMIN B-1) 100 MG tablet Take 100 mg by mouth daily.     No current facility-administered medications for this visit.    SURGICAL HISTORY:  Past Surgical History:  Procedure Laterality Date   ABDOMINAL HYSTERECTOMY     AV FISTULA PLACEMENT Left 12/01/2021   Procedure: INSERTION OF LEFT ARM ARTERIOVENOUS (AV) GORE-TEX GRAFT;  Surgeon: Serafina Mitchell, MD;  Location: [facility];  Service: Vascular;  Laterality: Left;   BACK SURGERY  0923   [place] Left 04/05/2020   Procedure: BASILIC VEIN TRANSPOSITION FIRST STAGE;  Surgeon: Angelia Mould, MD;  Location: [facility];  Service: Vascular;  Laterality: Left;   BRONCHIAL BIOPSY  01/23/2022   Procedure: BRONCHIAL BIOPSIES;  Surgeon: Garner Nash, DO;  Location: [facility];  Service: Pulmonary;;   BRONCHIAL NEEDLE ASPIRATION BIOPSY  01/23/2022   Procedure: BRONCHIAL NEEDLE ASPIRATION BIOPSIES;  Surgeon: Garner Nash, DO;  Location: [facility];  Service: Pulmonary;;   COLON SURGERY  1995   DUE TO POLYP   DILATION AND CURETTAGE OF UTERUS     ENDOBRONCHIAL ULTRASOUND  01/23/2022   Procedure: ENDOBRONCHIAL ULTRASOUND;  Surgeon: Garner Nash, DO;  Location: [facility];  Service: Pulmonary;;   ENTEROSCOPY N/A 07/10/2021   Procedure: ENTEROSCOPY;  Surgeon:  Ronnette Juniper, MD;  Location: [facility];  Service: Gastroenterology;  Laterality: N/A;   ESOPHAGOGASTRODUODENOSCOPY N/A 01/25/2021   Procedure: ESOPHAGOGASTRODUODENOSCOPY (EGD);  Surgeon: Clarene Essex, MD;  Location: [facility];  Service: Endoscopy;  Laterality: N/A;   FINE NEEDLE ASPIRATION  01/23/2022   Procedure: FINE NEEDLE ASPIRATION (FNA) LINEAR;  Surgeon: Garner Nash, DO;  Location: [facility];  Service: Pulmonary;;   FOREARM FRACTURE SURGERY     Left arm   GIVENS CAPSULE STUDY N/A 07/07/2021   Procedure: GIVENS CAPSULE STUDY;  Surgeon: Clarene Essex, MD;  Location: [facility];  Service: Endoscopy;  Laterality: N/A;   HERNIA  REPAIR     w/ mesh   HOT HEMOSTASIS N/A 07/10/2021   Procedure: HOT HEMOSTASIS (ARGON PLASMA COAGULATION/BICAP);  Surgeon: Ronnette Juniper, MD;  Location: [facility];  Service: Gastroenterology;  Laterality: N/A;   INCISION AND DRAINAGE ABSCESS Left 02/04/2013   Procedure: INCISION AND DRAINAGE LEFT BUTTOCK ABSCESS; INCISION AND DRAINAGE LEFT BREAST ABSCESS;  Surgeon: Harl Bowie, MD;  Location: [facility];  Service: General;  Laterality: Left;   INCISION AND DRAINAGE ABSCESS N/A 02/12/2013   Procedure: INCISION AND DEBRIDEMENT BUTTOCK WOUND ;  Surgeon: [name]. Georgette Dover, MD;  Location: [facility];  Service: General;  Laterality: N/A;   INCISION AND DRAINAGE ABSCESS N/A 02/14/2013   Procedure: INCISION AND DRAINAGE/DRESSING CHANGE;  Surgeon: Harl Bowie, MD;  Location: [facility];  Service: General;  Laterality: N/A;   INCISION AND DRAINAGE ABSCESS N/A 03/21/2015   Procedure: INCISION AND DRAINAGE PUBIC ABSCESS;  Surgeon: Excell Seltzer, MD;  Location: [facility];  Service: General;  Laterality: N/A;   INCISION AND DRAINAGE PERIRECTAL ABSCESS Left 02/10/2013   Procedure: IRRIGATION AND DEBRIDEMENT OF BUTTOCK/PERINEAL ABSCESS;  Surgeon: [name]. Georgette Dover, MD;  Location: [facility];  Service: General;  Laterality: Left;   INCISION AND DRAINAGE PERIRECTAL ABSCESS N/A 02/16/2013    Procedure: IRRIGATION AND DEBRIDEMENT PERINEAL ABSCESS;  Surgeon: Zenovia Jarred, MD;  Location: [facility];  Service: General;  Laterality: N/A;   IR FLUORO GUIDE CV LINE RIGHT  11/30/2021   IR US GUIDE VASC ACCESS RIGHT  11/30/2021   IRRIGATION AND DEBRIDEMENT ABSCESS N/A 02/06/2013   Procedure: IRRIGATION AND DEBRIDEMENT BUTTOCK ABSCESS AND DRESSING CHANGE;  Surgeon: Harl Bowie, MD;  Location: [facility];  Service: General;  Laterality: N/A;   IRRIGATION AND DEBRIDEMENT ABSCESS Left 02/08/2013   Procedure: IRRIGATION AND DEBRIDEMENT ABSCESS/DRESSING CHANGE;  Surgeon: Gwenyth Ober, MD;  Location: [facility];  Service: General;  Laterality: Left;   JOINT REPLACEMENT  2010 / 2012    LAPAROSCOPIC CHOLECYSTECTOMY     [place] N/A 07/02/2014   Procedure: LEFT HEART CATHETERIZATION WITH CORONARY ANGIOGRAM;  Surgeon: Lorretta Harp, MD;  Location: [facility];  Service: Cardiovascular;  Laterality: N/A;   LOWER EXTREMITY ANGIOGRAM Right 04/20/2016   Procedure: Lower Extremity Angiogram;  Surgeon: Elam Dutch, MD;  Location: [facility];  Service: Cardiovascular;  Laterality: Right;   MULTIPLE TOOTH EXTRACTIONS     PERIPHERAL VASCULAR CATHETERIZATION N/A 04/20/2016   Procedure: Abdominal Aortogram;  Surgeon: Elam Dutch, MD;  Location: [facility];  Service: Cardiovascular;  Laterality: N/A;   PERIPHERAL VASCULAR CATHETERIZATION Right 04/20/2016   Procedure: Peripheral Vascular Intervention;  Surgeon: Elam Dutch, MD;  Location: [facility];  Service: Cardiovascular;  Laterality: Right;  [place]   RIGHT HEART CATH N/A 10/27/2018   Procedure: RIGHT HEART CATH;  Surgeon: Larey Dresser, MD;  Location: [facility];  Service: Cardiovascular;  Laterality: N/A;   RIGHT HEART CATH N/A 03/20/2019   Procedure: RIGHT HEART CATH;  Surgeon: Larey Dresser, MD;  Location: [facility];  Service: Cardiovascular;  Laterality: N/A;    THYROIDECTOMY     TOTAL HIP REVISION Right 08/11/2014   Procedure: RIGHT ACETABULAR REVISION;  Surgeon: Gearlean Alf, MD;  Location: [facility];  Service: Orthopedics;  Laterality: Right;   VASCULAR SURGERY     VIDEO BRONCHOSCOPY WITH RADIAL ENDOBRONCHIAL ULTRASOUND  01/23/2022   Procedure: RADIAL ENDOBRONCHIAL ULTRASOUND;  Surgeon: June Leap  L, DO;  Location: [facility];  Service: Pulmonary;;    REVIEW OF SYSTEMS:  Constitutional: positive for fatigue Eyes: negative Ears, nose, mouth, throat, and face: negative Respiratory: positive for pleurisy/chest pain Cardiovascular: negative Gastrointestinal: negative Genitourinary:negative Integument/breast: negative Hematologic/lymphatic: negative Musculoskeletal:negative Neurological: negative Behavioral/Psych: negative Endocrine: negative Allergic/Immunologic: negative   PHYSICAL EXAMINATION: General appearance: alert, cooperative, and no distress Head: Normocephalic, without obvious abnormality, atraumatic Neck: no adenopathy, no JVD, supple, symmetrical, trachea midline, and thyroid not enlarged, symmetric, no tenderness/mass/nodules Lymph nodes: Cervical, supraclavicular, and axillary nodes normal. Resp: clear to auscultation bilaterally Back: symmetric, no curvature. ROM normal. No CVA tenderness. Cardio: regular rate and rhythm, S1, S2 normal, no murmur, click, rub or gallop GI: soft, non-tender; bowel sounds normal; no masses,  no organomegaly Extremities: extremities normal, atraumatic, no cyanosis or edema Neurologic: Alert and oriented X 3, normal strength and tone. Normal symmetric reflexes. Normal coordination and gait  ECOG PERFORMANCE STATUS: 1 - Symptomatic but completely ambulatory  Blood pressure (!) 140/53, pulse 100, temperature 98.4 F (36.9 C), resp. rate 16, weight 221 lb 14.4 oz (100.7 kg), SpO2 94 %.  LABORATORY DATA: Lab Results  Component Value Date   WBC 9.5 06/25/2022   HGB 10.3 (L) 06/25/2022    HCT 34.8 (L) 06/25/2022   MCV 99.1 06/25/2022   PLT 227 06/25/2022      Chemistry      Component Value Date/Time   NA 140 06/25/2022 0955   NA 146 (H) 12/23/2013 1132   K 4.4 06/25/2022 0955   CL 99 06/25/2022 0955   CO2 28 06/25/2022 0955   BUN 53 (H) 06/25/2022 0955   BUN 31 (H) 12/23/2013 1132   CREATININE 5.13 (HH) 06/25/2022 0955   CREATININE 1.32 (H) 06/29/2014 1100      Component Value Date/Time   CALCIUM 10.2 06/25/2022 0955   CALCIUM 9.1 09/07/2021 1036   ALKPHOS 171 (H) 06/25/2022 0955   AST 14 (L) 06/25/2022 0955   ALT 9 06/25/2022 0955   BILITOT 0.3 06/25/2022 0955       RADIOGRAPHIC STUDIES: CT Chest Wo Contrast  Result Date: 06/25/2022 CLINICAL DATA:  Stage IA left squamous cell lung cancer diagnosed April 2023 completed radiation therapy 04/06/2022. Restaging. * Tracking Code: BO * EXAM: CT CHEST WITHOUT CONTRAST TECHNIQUE: Multidetector CT imaging of the chest was performed following the standard protocol without IV contrast. RADIATION DOSE REDUCTION: This exam was performed according to the departmental dose-optimization program which includes automated exposure control, adjustment of the mA and/or kV according to patient size and/or use of iterative reconstruction technique. COMPARISON:  12/29/2021 PET-CT.  11/28/2021 chest CT. FINDINGS: Cardiovascular: Borderline mild cardiomegaly. No significant pericardial effusion/thickening. Left anterior descending and left circumflex coronary atherosclerosis. Atherosclerotic nonaneurysmal thoracic aorta. Normal caliber pulmonary arteries. Mediastinum/Nodes: Apparent total thyroidectomy. Unremarkable esophagus. No pathologically enlarged axillary, mediastinal or hilar lymph nodes, noting limited sensitivity for the detection of hilar adenopathy on this noncontrast study. Lungs/Pleura: No pneumothorax. No pleural effusion. Mild paraseptal and centrilobular emphysema. Previously described indistinct 0.5 cm right middle lobe  pulmonary nodule on 11/28/2021 chest CT study has resolved. Ill-defined 2.2 x 1.5 cm solid central left lower lobe pulmonary nodule (series 7/image 80), decreased from 2.8 x 1.8 cm on 11/28/2021 chest CT. New solid 1.6 cm basilar left lower lobe pulmonary nodule (series 7/image 110) and separate new solid 1.5 cm medial left lower lobe pulmonary nodule (series 7/image 102). Upper abdomen: Simple exophytic 1.7 cm anterior upper right renal cyst and simple exophytic anterolateral  left upper lobe 2.5 cm cyst, for which no imaging follow-up is recommended. Generalized thickening of the bilateral adrenal glands without discrete adrenal nodules, unchanged, favoring adrenal hyperplasia. Musculoskeletal: No aggressive appearing focal osseous lesions. Marked thoracic spondylosis. Stable small sclerotic lesion in the posterior T3 vertebral body (series 6/image 87), favor a bone island. Healed deformities within multiple posterior and anterior right ribs and within multiple anterior left ribs. IMPRESSION: 1. The treated primary central left lower lobe neoplasm is decreased in size. 2. Two new separate solid basilar left lower lobe pulmonary nodules, largest 1.6 cm, compatible with new pulmonary metastases. 3. Previously described indistinct 0.5 cm right middle lobe pulmonary nodule on 11/28/2021 chest CT study has resolved. 4. No thoracic adenopathy. 5. Borderline mild cardiomegaly. Two-vessel coronary atherosclerosis. 6. Aortic Atherosclerosis (ICD10-I70.0) and Emphysema (ICD10-J43.9). Electronically Signed   By: Ilona Sorrel M.D.   On: 06/25/2022 21:32    ASSESSMENT AND PLAN: This is a very pleasant 71 years old African-American female with history of stage IA (T1c, N0, M0) non-small cell lung cancer, squamous cell carcinoma presented with left lower lobe lung mass status post curative radiotherapy under the care of Dr. Lisbeth Renshaw completed April 06, 2022. The patient has been on observation since that time and repeat CT scan of  the chest performed recently showed 2 new separate solid basilar left lower lobe pulmonary nodules the largest measured 1.6 cm compatible with new pulmonary metastasis. I personally and independently reviewed the scan images and discussed the result and showed the images to the patient today. This is highly suspicious for disease metastasis from her previous left lower lobe lung non-small cell lung cancer. I recommended for the patient to have a PET scan for further evaluation of these 2 nodules.  I will also refer her to see Dr. Lisbeth Renshaw for consideration of SBRT to this new nodules but if the patient has any additional evidence of metastatic disease, I will discuss with her other systemic treatment options which will be tricky with her chronic renal insufficiency. The patient will come back for follow-up visit in 2 weeks for further evaluation and discussion of her treatment options based on the PET scan results. She was advised to call immediately if she has any other concerning symptoms in the interval. The patient voices understanding of current disease status and treatment options and is in agreement with the current care plan.  All questions were answered. The patient knows to call the clinic with any problems, questions or concerns. We can certainly see the patient much sooner if necessary.  The total time spent in the appointment was 30 minutes.  Disclaimer: This note was dictated with voice recognition software. Similar sounding words can inadvertently be transcribed and may not be corrected upon review.

## 2022-07-02 ENCOUNTER — Telehealth: Payer: Self-pay | Admitting: Internal Medicine

## 2022-07-02 NOTE — Telephone Encounter (Signed)
Called patient regarding 10/04 appointment, patient is notified.

## 2022-07-09 ENCOUNTER — Encounter (HOSPITAL_COMMUNITY)
Admission: RE | Admit: 2022-07-09 | Discharge: 2022-07-09 | Disposition: A | Discharge status: Home / Self Care | Payer: 59 | Source: Ambulatory Visit | Attending: Internal Medicine | Admitting: Internal Medicine

## 2022-07-09 DIAGNOSIS — C3490 Malignant neoplasm of unspecified part of unspecified bronchus or lung: Secondary | ICD-10-CM | POA: Diagnosis present

## 2022-07-09 DIAGNOSIS — C3432 Malignant neoplasm of lower lobe, left bronchus or lung: Secondary | ICD-10-CM | POA: Insufficient documentation

## 2022-07-09 LAB — GLUCOSE, CAPILLARY: Glucose-Capillary: 190 mg/dL — ABNORMAL HIGH (ref 70–99)

## 2022-07-09 MED ORDER — FLUDEOXYGLUCOSE F - 18 (FDG) INJECTION
10.9000 | Freq: Once | INTRAVENOUS | Status: AC
  Administered 2022-07-09: 10.9 via INTRAVENOUS

## 2022-07-10 ENCOUNTER — Inpatient Hospital Stay: Payer: 59 | Admitting: Internal Medicine

## 2022-07-11 ENCOUNTER — Inpatient Hospital Stay: Payer: 59 | Attending: Internal Medicine | Admitting: Internal Medicine

## 2022-07-11 ENCOUNTER — Other Ambulatory Visit: Payer: Self-pay

## 2022-07-11 ENCOUNTER — Encounter: Payer: Self-pay | Admitting: Internal Medicine

## 2022-07-11 VITALS — BP 131/71 | HR 94 | Temp 97.8°F | Ht 63.0 in | Wt 220.0 lb

## 2022-07-11 DIAGNOSIS — M109 Gout, unspecified: Secondary | ICD-10-CM | POA: Insufficient documentation

## 2022-07-11 DIAGNOSIS — E039 Hypothyroidism, unspecified: Secondary | ICD-10-CM | POA: Insufficient documentation

## 2022-07-11 DIAGNOSIS — M47814 Spondylosis without myelopathy or radiculopathy, thoracic region: Secondary | ICD-10-CM | POA: Insufficient documentation

## 2022-07-11 DIAGNOSIS — Z79899 Other long term (current) drug therapy: Secondary | ICD-10-CM | POA: Diagnosis not present

## 2022-07-11 DIAGNOSIS — Z7951 Long term (current) use of inhaled steroids: Secondary | ICD-10-CM | POA: Diagnosis not present

## 2022-07-11 DIAGNOSIS — Z8614 Personal history of Methicillin resistant Staphylococcus aureus infection: Secondary | ICD-10-CM | POA: Insufficient documentation

## 2022-07-11 DIAGNOSIS — K219 Gastro-esophageal reflux disease without esophagitis: Secondary | ICD-10-CM | POA: Diagnosis not present

## 2022-07-11 DIAGNOSIS — G4730 Sleep apnea, unspecified: Secondary | ICD-10-CM | POA: Diagnosis not present

## 2022-07-11 DIAGNOSIS — R918 Other nonspecific abnormal finding of lung field: Secondary | ICD-10-CM | POA: Diagnosis not present

## 2022-07-11 DIAGNOSIS — I132 Hypertensive heart and chronic kidney disease with heart failure and with stage 5 chronic kidney disease, or end stage renal disease: Secondary | ICD-10-CM | POA: Diagnosis not present

## 2022-07-11 DIAGNOSIS — G894 Chronic pain syndrome: Secondary | ICD-10-CM | POA: Insufficient documentation

## 2022-07-11 DIAGNOSIS — E1122 Type 2 diabetes mellitus with diabetic chronic kidney disease: Secondary | ICD-10-CM | POA: Diagnosis not present

## 2022-07-11 DIAGNOSIS — E785 Hyperlipidemia, unspecified: Secondary | ICD-10-CM | POA: Diagnosis not present

## 2022-07-11 DIAGNOSIS — I700 Atherosclerosis of aorta: Secondary | ICD-10-CM | POA: Insufficient documentation

## 2022-07-11 DIAGNOSIS — Z85118 Personal history of other malignant neoplasm of bronchus and lung: Secondary | ICD-10-CM | POA: Insufficient documentation

## 2022-07-11 DIAGNOSIS — N186 End stage renal disease: Secondary | ICD-10-CM | POA: Insufficient documentation

## 2022-07-11 DIAGNOSIS — J432 Centrilobular emphysema: Secondary | ICD-10-CM | POA: Diagnosis not present

## 2022-07-11 DIAGNOSIS — I5032 Chronic diastolic (congestive) heart failure: Secondary | ICD-10-CM | POA: Insufficient documentation

## 2022-07-11 DIAGNOSIS — C3490 Malignant neoplasm of unspecified part of unspecified bronchus or lung: Secondary | ICD-10-CM

## 2022-07-11 DIAGNOSIS — R222 Localized swelling, mass and lump, trunk: Secondary | ICD-10-CM | POA: Diagnosis not present

## 2022-07-11 DIAGNOSIS — Z794 Long term (current) use of insulin: Secondary | ICD-10-CM | POA: Insufficient documentation

## 2022-07-11 DIAGNOSIS — Z79890 Hormone replacement therapy (postmenopausal): Secondary | ICD-10-CM | POA: Diagnosis not present

## 2022-07-11 DIAGNOSIS — Z7902 Long term (current) use of antithrombotics/antiplatelets: Secondary | ICD-10-CM | POA: Diagnosis not present

## 2022-07-11 DIAGNOSIS — Z8673 Personal history of transient ischemic attack (TIA), and cerebral infarction without residual deficits: Secondary | ICD-10-CM | POA: Diagnosis not present

## 2022-07-11 NOTE — Progress Notes (Signed)
[place] [phone]   [phone]  OFFICE PROGRESS NOTE  Antony Blackbird, MD [address] [place] [address]  DIAGNOSIS: Recurrent non-small cell lung cancer diagnosed and September 2023 and initially diagnosed as stage IA (T1c, N0, M0) non-small cell lung cancer, squamous cell carcinoma presented with left lower lobe lung mass diagnosed in April 2023.  PRIOR THERAPY: Status post curative radiotherapy to the left lower lobe lung mass under the care of Dr. Lisbeth Renshaw completed on April 06, 2022.  CURRENT THERAPY: Observation.  INTERVAL HISTORY: [name] [name] 71 y.o. female returns to the clinic today for follow-up visit.  The patient is feeling fine today with no concerning complaints except for mild cough.  She has no chest pain, shortness of breath or hemoptysis.  She has no nausea, vomiting, diarrhea or constipation.  She has no headache or visual changes.  She has no recent weight loss or night sweats.  She was found on previous CT scan of the chest to have a suspicious nodule in the left lung.  The patient had a PET scan performed recently and she is here for evaluation and discussion of her scan results and treatment options.   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  MEDICATIONS:  Current Outpatient Medications  Medication Sig Dispense Refill   acetaminophen (TYLENOL) 500 MG tablet Take 1,000 mg by mouth 3 (three) times daily.     albuterol (PROVENTIL) (2.5 MG/3ML) 0.083% nebulizer solution Take 3 mLs (2.5 mg total) by nebulization every 6 (six) hours as needed for wheezing or shortness of breath. 75 mL 12   albuterol (VENTOLIN HFA) 108 (90 Base) MCG/ACT inhaler Inhale 2 puffs into the lungs every 6 (six) hours as needed for shortness of breath (COPD).     allopurinol (ZYLOPRIM) 100 MG tablet Take 1 tablet (100 mg total) by mouth daily. 30 tablet 0   budesonide-formoterol (SYMBICORT) 160-4.5 MCG/ACT inhaler Inhale 2 puffs into the lungs See admin instructions. Tues., Thurs, and Saturday before dialysis     carvedilol (COREG) 12.5 MG tablet Take 1 tablet (12.5 mg total) by mouth 2 (two) times daily. 60 tablet 0   clopidogrel (PLAVIX) 75 MG tablet Take 75 mg by mouth daily.     Insulin Lispro Prot & Lispro (HUMALOG 75/25  MIX) (75-25) 100 UNIT/ML Kwikpen Inject 10-20 Units into the skin See admin instructions. Take 10 units is the morning and 20 units at bedtime     levothyroxine (SYNTHROID) 200 MCG tablet Take 1 tablet (200 mcg total) by mouth daily at 6 (six) AM. 30 tablet 0   lidocaine (LIDODERM) 5 % 2 patches every 12 (twelve) hours. 12 hours off 1 patch Each side of lower back     lidocaine-prilocaine (EMLA) cream SMARTSIG:sparingly Topical 3 Times a Week     lubiprostone (AMITIZA) 24 MCG capsule Take 24 mcg by mouth 2 (two) times daily.     magnesium oxide (MAG-OX) 400 (241.3 Mg) MG tablet Take 1 tablet (400 mg total) by mouth 2 (two) times daily. 30 tablet 0   multivitamin (RENA-VIT) TABS  tablet Take 1 tablet by mouth daily.     OVER THE COUNTER MEDICATION Place 1 drop into both eyes daily as needed (Itching eye). Refresh opt lubricant eye drops     pantoprazole (PROTONIX) 40 MG tablet Take 1 tablet (40 mg total) by mouth 2 (two) times daily before a meal. (Patient taking differently: Take 40 mg by mouth 2 (two) times daily.)     polyethylene glycol (MIRALAX / GLYCOLAX) 17 g packet Take 17 g by mouth daily. 14 each 0   sevelamer carbonate (RENVELA) 800 MG tablet Take 800 mg by mouth 3 (three) times daily with meals.     thiamine (VITAMIN B-1) 100 MG tablet Take 100 mg by mouth daily.     No current facility-administered medications for this visit.    SURGICAL HISTORY:  Past Surgical History:  Procedure Laterality Date   ABDOMINAL HYSTERECTOMY     AV FISTULA PLACEMENT Left 12/01/2021   Procedure: INSERTION OF LEFT ARM ARTERIOVENOUS (AV) GORE-TEX GRAFT;  Surgeon: Serafina Mitchell, MD;  Location: [facility];  Service: Vascular;  Laterality: Left;   BACK SURGERY  [address]   [place] Left 04/05/2020   Procedure: BASILIC VEIN TRANSPOSITION FIRST STAGE;  Surgeon: Angelia Mould, MD;  Location: [facility];  Service: Vascular;  Laterality: Left;   BRONCHIAL BIOPSY  01/23/2022   Procedure: BRONCHIAL BIOPSIES;  Surgeon: Garner Nash, DO;  Location: [facility];  Service: Pulmonary;;   BRONCHIAL NEEDLE ASPIRATION BIOPSY  01/23/2022   Procedure: BRONCHIAL NEEDLE ASPIRATION BIOPSIES;  Surgeon: Garner Nash, DO;  Location: [facility];  Service: Pulmonary;;   COLON SURGERY  1995   DUE TO POLYP   DILATION AND CURETTAGE OF UTERUS     ENDOBRONCHIAL ULTRASOUND  01/23/2022   Procedure: ENDOBRONCHIAL ULTRASOUND;  Surgeon: Garner Nash, DO;  Location: [facility];  Service: Pulmonary;;   ENTEROSCOPY N/A 07/10/2021   Procedure: ENTEROSCOPY;  Surgeon: Ronnette Juniper, MD;  Location: [facility];  Service: Gastroenterology;  Laterality: N/A;   ESOPHAGOGASTRODUODENOSCOPY N/A  01/25/2021   Procedure: ESOPHAGOGASTRODUODENOSCOPY (EGD);  Surgeon: Clarene Essex, MD;  Location: [facility];  Service: Endoscopy;  Laterality: N/A;   FINE NEEDLE ASPIRATION  01/23/2022   Procedure: FINE NEEDLE ASPIRATION (FNA) LINEAR;  Surgeon: Garner Nash, DO;  Location: [facility];  Service: Pulmonary;;   FOREARM FRACTURE SURGERY     Left arm   GIVENS CAPSULE STUDY N/A 07/07/2021   Procedure: GIVENS CAPSULE STUDY;  Surgeon: Clarene Essex, MD;  Location: [facility];  Service: Endoscopy;  Laterality: N/A;   HERNIA REPAIR     w/ mesh   HOT HEMOSTASIS N/A 07/10/2021   Procedure: HOT HEMOSTASIS (ARGON PLASMA COAGULATION/BICAP);  Surgeon: Therisa Doyne,  Megan Salon, MD;  Location: [facility];  Service: Gastroenterology;  Laterality: N/A;   INCISION AND DRAINAGE ABSCESS Left 02/04/2013   Procedure: INCISION AND DRAINAGE LEFT BUTTOCK ABSCESS; INCISION AND DRAINAGE LEFT BREAST ABSCESS;  Surgeon: Harl Bowie, MD;  Location: [facility];  Service: General;  Laterality: Left;   INCISION AND DRAINAGE ABSCESS N/A 02/12/2013   Procedure: INCISION AND DEBRIDEMENT BUTTOCK WOUND ;  Surgeon: [name]. Georgette Dover, MD;  Location: [facility];  Service: General;  Laterality: N/A;   INCISION AND DRAINAGE ABSCESS N/A 02/14/2013   Procedure: INCISION AND DRAINAGE/DRESSING CHANGE;  Surgeon: Harl Bowie, MD;  Location: [facility];  Service: General;  Laterality: N/A;   INCISION AND DRAINAGE ABSCESS N/A 03/21/2015   Procedure: INCISION AND DRAINAGE PUBIC ABSCESS;  Surgeon: Excell Seltzer, MD;  Location: [facility];  Service: General;  Laterality: N/A;   INCISION AND DRAINAGE PERIRECTAL ABSCESS Left 02/10/2013   Procedure: IRRIGATION AND DEBRIDEMENT OF BUTTOCK/PERINEAL ABSCESS;  Surgeon: [name]. Georgette Dover, MD;  Location: [facility];  Service: General;  Laterality: Left;   INCISION AND DRAINAGE PERIRECTAL ABSCESS N/A 02/16/2013   Procedure: IRRIGATION AND DEBRIDEMENT PERINEAL ABSCESS;  Surgeon: Zenovia Jarred, MD;  Location: [facility];  Service:  General;  Laterality: N/A;   IR FLUORO GUIDE CV LINE RIGHT  11/30/2021   IR US GUIDE VASC ACCESS RIGHT  11/30/2021   IRRIGATION AND DEBRIDEMENT ABSCESS N/A 02/06/2013   Procedure: IRRIGATION AND DEBRIDEMENT BUTTOCK ABSCESS AND DRESSING CHANGE;  Surgeon: Harl Bowie, MD;  Location: [facility];  Service: General;  Laterality: N/A;   IRRIGATION AND DEBRIDEMENT ABSCESS Left 02/08/2013   Procedure: IRRIGATION AND DEBRIDEMENT ABSCESS/DRESSING CHANGE;  Surgeon: Gwenyth Ober, MD;  Location: [facility];  Service: General;  Laterality: Left;   JOINT REPLACEMENT  2010 / 2012    LAPAROSCOPIC CHOLECYSTECTOMY     [place] N/A 07/02/2014   Procedure: LEFT HEART CATHETERIZATION WITH CORONARY ANGIOGRAM;  Surgeon: Lorretta Harp, MD;  Location: [facility];  Service: Cardiovascular;  Laterality: N/A;   LOWER EXTREMITY ANGIOGRAM Right 04/20/2016   Procedure: Lower Extremity Angiogram;  Surgeon: Elam Dutch, MD;  Location: [facility];  Service: Cardiovascular;  Laterality: Right;   MULTIPLE TOOTH EXTRACTIONS     PERIPHERAL VASCULAR CATHETERIZATION N/A 04/20/2016   Procedure: Abdominal Aortogram;  Surgeon: Elam Dutch, MD;  Location: [facility];  Service: Cardiovascular;  Laterality: N/A;   PERIPHERAL VASCULAR CATHETERIZATION Right 04/20/2016   Procedure: Peripheral Vascular Intervention;  Surgeon: Elam Dutch, MD;  Location: [facility];  Service: Cardiovascular;  Laterality: Right;  popiteal   RIGHT HEART CATH N/A 10/27/2018   Procedure: RIGHT HEART CATH;  Surgeon: Larey Dresser, MD;  Location: [facility];  Service: Cardiovascular;  Laterality: N/A;   RIGHT HEART CATH N/A 03/20/2019   Procedure: RIGHT HEART CATH;  Surgeon: Larey Dresser, MD;  Location: [facility];  Service: Cardiovascular;  Laterality: N/A;   THYROIDECTOMY     TOTAL HIP REVISION Right 08/11/2014   Procedure: RIGHT ACETABULAR REVISION;  Surgeon: Gearlean Alf, MD;  Location: [facility];  Service: Orthopedics;  Laterality: Right;   VASCULAR SURGERY     VIDEO BRONCHOSCOPY WITH RADIAL ENDOBRONCHIAL ULTRASOUND  01/23/2022   Procedure: RADIAL ENDOBRONCHIAL ULTRASOUND;  Surgeon: Garner Nash, DO;  Location: [facility];  Service: Pulmonary;;    REVIEW OF SYSTEMS:  Constitutional: positive for fatigue Eyes: negative Ears, nose,  mouth, throat, and face: negative Respiratory: negative Cardiovascular: negative Gastrointestinal: negative Genitourinary:negative Integument/breast: negative Hematologic/lymphatic: negative Musculoskeletal:negative Neurological: negative Behavioral/Psych: negative Endocrine: negative Allergic/Immunologic: negative   PHYSICAL EXAMINATION: General appearance: alert, cooperative, and no distress Head: Normocephalic, without obvious abnormality, atraumatic Neck: no adenopathy, no JVD, supple, symmetrical, trachea midline, and thyroid not enlarged, symmetric, no tenderness/mass/nodules Lymph nodes: Cervical, supraclavicular, and axillary nodes normal. Resp: clear to auscultation bilaterally Back: symmetric, no curvature. ROM normal. No CVA tenderness. Cardio: regular rate and rhythm, S1, S2 normal, no murmur, click, rub or gallop GI: soft, non-tender; bowel sounds normal; no masses,  no organomegaly Extremities: extremities normal, atraumatic, no cyanosis or edema Neurologic: Alert and oriented X 3, normal strength and tone. Normal symmetric reflexes. Normal coordination and gait  ECOG PERFORMANCE STATUS: 1 - Symptomatic but completely ambulatory  There were no vitals taken for this visit.  LABORATORY DATA: Lab Results  Component Value Date   WBC 9.5 06/25/2022   HGB 10.3 (L) 06/25/2022   HCT 34.8 (L) 06/25/2022   MCV 99.1 06/25/2022   PLT 227 06/25/2022      Chemistry      Component Value Date/Time   NA 140 06/25/2022 0955   NA 146 (H) 12/23/2013 1132   K 4.4 06/25/2022 0955   CL 99 06/25/2022  0955   CO2 28 06/25/2022 0955   BUN 53 (H) 06/25/2022 0955   BUN 31 (H) 12/23/2013 1132   CREATININE 5.13 (HH) 06/25/2022 0955   CREATININE 1.32 (H) 06/29/2014 1100      Component Value Date/Time   CALCIUM 10.2 06/25/2022 0955   CALCIUM 9.1 09/07/2021 1036   ALKPHOS 171 (H) 06/25/2022 0955   AST 14 (L) 06/25/2022 0955   ALT 9 06/25/2022 0955   BILITOT 0.3 06/25/2022 0955       RADIOGRAPHIC STUDIES: NM PET Image Restage (PS) Skull Base to Thigh (F-18 FDG)  Result Date: 07/10/2022 CLINICAL DATA:  Subsequent treatment strategy for non-small cell lung cancer. EXAM: NUCLEAR MEDICINE PET SKULL BASE TO THIGH TECHNIQUE: 10.9 mCi F-18 FDG was injected intravenously. Full-ring PET imaging was performed from the skull base to thigh after the radiotracer. CT data was obtained and used for attenuation correction and anatomic localization. Fasting blood glucose: 190 mg/dl COMPARISON:  CT 11/28/2021, PET-CT 12/29/2021 FINDINGS: Mediastinal blood pool activity: SUV max 2 point Liver activity: SUV max NA NECK: No hypermetabolic lymph nodes in the neck. Incidental CT findings: None. CHEST: The treated nodule in superior segment of the LEFT lower lobe has no significant metabolic activity. There is decrease in size of the nodule measuring 18 mm compared to 23 mm. The 2 adjacent LEFT lobe pulmonary nodules measuring 19 mm and 19 mm on images 86 and 89 series 4 of intense metabolic activity with SUV max equal 9.0. No hypermetabolic mediastinal lymph nodes. Incidental CT findings: None. ABDOMEN/PELVIS: No abnormal hypermetabolic activity within the liver, pancreas, adrenal glands, or spleen. No hypermetabolic lymph nodes in the abdomen or pelvis. Incidental CT findings: None. SKELETON: No focal hypermetabolic activity to suggest skeletal metastasis. Incidental CT findings: None. IMPRESSION: 1. Two adjacent intensely hypermetabolic nodules in the inferior LEFT lower lobe consistent with lung cancer  recurrence/metastasis versus metachronous bronchogenic carcinoma 2. No significant metabolic activity and decrease in size of treated LEFT lower lobe nodular bronchogenic carcinoma. 3. No evidence of distant metastatic disease. Electronically Signed   By: Suzy Bouchard M.D.   On: 07/10/2022 11:16   CT Chest Wo Contrast  Result Date: 06/25/2022 CLINICAL DATA:  Stage IA left squamous cell lung cancer diagnosed April 2023 completed radiation therapy 04/06/2022. Restaging. * Tracking Code: BO * EXAM: CT CHEST WITHOUT CONTRAST TECHNIQUE: Multidetector CT imaging of the chest was performed following the standard protocol without IV contrast. RADIATION DOSE REDUCTION: This exam was performed according to the departmental dose-optimization program which includes automated exposure control, adjustment of the mA and/or kV according to patient size and/or use of iterative reconstruction technique. COMPARISON:  12/29/2021 PET-CT.  11/28/2021 chest CT. FINDINGS: Cardiovascular: Borderline mild cardiomegaly. No significant pericardial effusion/thickening. Left anterior descending and left circumflex coronary atherosclerosis. Atherosclerotic nonaneurysmal thoracic aorta. Normal caliber pulmonary arteries. Mediastinum/Nodes: Apparent total thyroidectomy. Unremarkable esophagus. No pathologically enlarged axillary, mediastinal or hilar lymph nodes, noting limited sensitivity for the detection of hilar adenopathy on this noncontrast study. Lungs/Pleura: No pneumothorax. No pleural effusion. Mild paraseptal and centrilobular emphysema. Previously described indistinct 0.5 cm right middle lobe pulmonary nodule on 11/28/2021 chest CT study has resolved. Ill-defined 2.2 x 1.5 cm solid central left lower lobe pulmonary nodule (series 7/image 80), decreased from 2.8 x 1.8 cm on 11/28/2021 chest CT. New solid 1.6 cm basilar left lower lobe pulmonary nodule (series 7/image 110) and separate new solid 1.5 cm medial left lower lobe  pulmonary nodule (series 7/image 102). Upper abdomen: Simple exophytic 1.7 cm anterior upper right renal cyst and simple exophytic anterolateral left upper lobe 2.5 cm cyst, for which no imaging follow-up is recommended. Generalized thickening of the bilateral adrenal glands without discrete adrenal nodules, unchanged, favoring adrenal hyperplasia. Musculoskeletal: No aggressive appearing focal osseous lesions. Marked thoracic spondylosis. Stable small sclerotic lesion in the posterior T3 vertebral body (series 6/image 87), favor a bone island. Healed deformities within multiple posterior and anterior right ribs and within multiple anterior left ribs. IMPRESSION: 1. The treated primary central left lower lobe neoplasm is decreased in size. 2. Two new separate solid basilar left lower lobe pulmonary nodules, largest 1.6 cm, compatible with new pulmonary metastases. 3. Previously described indistinct 0.5 cm right middle lobe pulmonary nodule on 11/28/2021 chest CT study has resolved. 4. No thoracic adenopathy. 5. Borderline mild cardiomegaly. Two-vessel coronary atherosclerosis. 6. Aortic Atherosclerosis (ICD10-I70.0) and Emphysema (ICD10-J43.9). Electronically Signed   By: Ilona Sorrel M.D.   On: 06/25/2022 21:32    ASSESSMENT AND PLAN: This is a very pleasant 71 years old African-American female with recurrent non-small cell lung cancer in September 2023 that was initially diagnosed as stage IA (T1c, N0, M0) non-small cell lung cancer, squamous cell carcinoma presented with left lower lobe lung mass status post curative radiotherapy under the care of Dr. Lisbeth Renshaw completed April 06, 2022. The patient has been on observation since that time and repeat CT scan of the chest performed recently showed 2 new separate solid basilar left lower lobe pulmonary nodules the largest measured 1.6 cm compatible with new pulmonary metastasis. The patient had a PET scan that showed hypermetabolic activity in the 2 pulmonary nodules  in the left lower lobe. I personally and independently reviewed the scan images and discussed the result with the patient today. I recommended for the patient to see Dr. Valeta Harms for consideration of repeat bronchoscopy and biopsy of these nodules for confirmation of the tissue diagnosis. In the meantime I referred the patient to Dr. Lisbeth Renshaw for consideration of curative SBRT to this lesion after confirmation of the recurrence. I will see her back for follow-up visit in 4 months for evaluation repeat CT scan of the chest for restaging of her disease. She was advised  to call immediately if she has any other concerning symptoms in the interval. The patient voices understanding of current disease status and treatment options and is in agreement with the current care plan.  All questions were answered. The patient knows to call the clinic with any problems, questions or concerns. We can certainly see the patient much sooner if necessary.  The total time spent in the appointment was 30 minutes.  Disclaimer: This note was dictated with voice recognition software. Similar sounding words can inadvertently be transcribed and may not be corrected upon review.

## 2022-07-18 ENCOUNTER — Encounter: Payer: Self-pay | Admitting: Radiation Oncology

## 2022-07-18 ENCOUNTER — Ambulatory Visit
Admission: RE | Admit: 2022-07-18 | Discharge: 2022-07-18 | Disposition: A | Discharge status: Home / Self Care | Payer: 59 | Source: Ambulatory Visit | Attending: Radiation Oncology | Admitting: Radiation Oncology

## 2022-07-18 ENCOUNTER — Other Ambulatory Visit: Payer: Self-pay

## 2022-07-18 VITALS — BP 131/67 | HR 84 | Temp 96.4°F | Resp 18 | Ht 63.0 in | Wt 226.0 lb

## 2022-07-18 DIAGNOSIS — I132 Hypertensive heart and chronic kidney disease with heart failure and with stage 5 chronic kidney disease, or end stage renal disease: Secondary | ICD-10-CM | POA: Diagnosis not present

## 2022-07-18 DIAGNOSIS — G894 Chronic pain syndrome: Secondary | ICD-10-CM | POA: Insufficient documentation

## 2022-07-18 DIAGNOSIS — E039 Hypothyroidism, unspecified: Secondary | ICD-10-CM | POA: Insufficient documentation

## 2022-07-18 DIAGNOSIS — Z7902 Long term (current) use of antithrombotics/antiplatelets: Secondary | ICD-10-CM | POA: Diagnosis not present

## 2022-07-18 DIAGNOSIS — E785 Hyperlipidemia, unspecified: Secondary | ICD-10-CM | POA: Diagnosis not present

## 2022-07-18 DIAGNOSIS — R222 Localized swelling, mass and lump, trunk: Secondary | ICD-10-CM | POA: Diagnosis not present

## 2022-07-18 DIAGNOSIS — Z8673 Personal history of transient ischemic attack (TIA), and cerebral infarction without residual deficits: Secondary | ICD-10-CM | POA: Diagnosis not present

## 2022-07-18 DIAGNOSIS — M109 Gout, unspecified: Secondary | ICD-10-CM | POA: Insufficient documentation

## 2022-07-18 DIAGNOSIS — I10 Essential (primary) hypertension: Secondary | ICD-10-CM | POA: Insufficient documentation

## 2022-07-18 DIAGNOSIS — J449 Chronic obstructive pulmonary disease, unspecified: Secondary | ICD-10-CM | POA: Insufficient documentation

## 2022-07-18 DIAGNOSIS — C3432 Malignant neoplasm of lower lobe, left bronchus or lung: Secondary | ICD-10-CM | POA: Insufficient documentation

## 2022-07-18 DIAGNOSIS — Z79899 Other long term (current) drug therapy: Secondary | ICD-10-CM | POA: Diagnosis not present

## 2022-07-18 DIAGNOSIS — I5032 Chronic diastolic (congestive) heart failure: Secondary | ICD-10-CM | POA: Diagnosis not present

## 2022-07-18 DIAGNOSIS — Z794 Long term (current) use of insulin: Secondary | ICD-10-CM | POA: Insufficient documentation

## 2022-07-18 DIAGNOSIS — Z87891 Personal history of nicotine dependence: Secondary | ICD-10-CM | POA: Insufficient documentation

## 2022-07-18 DIAGNOSIS — K219 Gastro-esophageal reflux disease without esophagitis: Secondary | ICD-10-CM | POA: Diagnosis not present

## 2022-07-18 DIAGNOSIS — Z809 Family history of malignant neoplasm, unspecified: Secondary | ICD-10-CM | POA: Insufficient documentation

## 2022-07-18 DIAGNOSIS — Z79890 Hormone replacement therapy (postmenopausal): Secondary | ICD-10-CM | POA: Diagnosis not present

## 2022-07-18 DIAGNOSIS — E109 Type 1 diabetes mellitus without complications: Secondary | ICD-10-CM | POA: Insufficient documentation

## 2022-07-18 NOTE — Progress Notes (Signed)
Reconsult nursing appointment for patient w/ Stage IA3, cT1cN0M0, NSCLC, squamous cell carcinoma of the LLL. I verified patient's identity and began nursing interview. Patient reports mild SOB that worsens w/ exertion. Patient denies any pain. No other related issues reported at this time.  Meaningful use complete.  Hysterectomy - No chances of pregnancy.  Radiation Treatment Dates:  03/28/2022 through 04/06/2022 SBRT Treatment Site Technique Total Dose (Gy) Dose per Fx (Gy) Completed Fx Beam Energies  Lung, Left: Lung_L IMRT 60/60 12 5/5 6XFFF    BP 131/67 (BP Location: Right Wrist, Patient Position: Sitting, Cuff Size: Large)   Pulse 84   Temp (!) 96.4 F (35.8 C) (Temporal)   Resp 18   Ht 5\' 3"  (1.6 m)   Wt 226 lb (102.5 kg)   SpO2 95%   BMI 40.03 kg/m

## 2022-07-18 NOTE — Progress Notes (Signed)
Radiation Oncology         [phone] ________________________________  Name: [name] [name]        MRN: [id number]  Date of Service: 07/18/2022 DOB: Mar 14, 1951  QH:UTML, Ander Gaster, MD  Curt Bears, MD     REFERRING PHYSICIAN: Curt Bears, MD   DIAGNOSIS: The encounter diagnosis was Malignant neoplasm of lower lobe of left lung ([place]).   HISTORY OF PRESENT ILLNESS: [name] [name] is a 71 y.o. female seen at the request of Dr. Julien Nordmann for a history of Stage IA3, cT1cN0M0, NSCLC, squamous cell carcinoma of the LLL. She was diagnosed this spring after being followed with interval imaging of the chest.  This area was PET positive in March 2023.  Bronchoscopy on 01/23/2022 showed malignant cells consistent with squamous cell carcinoma in her left lower lobe fine-needle aspirate, her level 11 L node fine-needle aspirate showed scant cellularity but benign tissue.  She was not felt to be a good surgical candidate given her pulmonary function and rather proceeded with stereotactic body radiotherapy which she completed on 04/06/2022.  She has been followed in surveillance and the area that was treated in the left lower lobe has remained stable.  Her most recent CT scan however on June 25, 2022 showed 2 separate solid basilar left lower lobe pulmonary nodules concerning for new disease.  The previously seen right middle lobe nodule had also resolved in the interval and there was no thoracic adenopathy.  The 2 separate lesions measured up to 1.6 cm and 1.5 cm.  She did undergo another PET scan on 02/06/2022 in both areas adjacent to the site of prior treatment were hypermetabolic with SUVs in the 9 range.  There had been decrease in the area of prior treatment, and no significant metabolic activity in that location.  She is seen to discuss potential options of additional stereotactic body radiotherapy.  She is also planning to meet back with Dr. Katy Fitch to discuss additional  bronchoscopy.    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   Full dentures    GERD (gastroesophageal reflux disease)    Gout    History of hiatal hernia    History of transfusion    Hx MRSA infection    abscess left groin   Hyperlipidemia    Hypertension    Hypothyroid    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HCC)    Peripheral vascular disease (HCC)    Sleep apnea    UNABLE TO TOLERATE C PAP   Stress incontinence    TIA (transient ischemic attack)     X2 NO RESIDUAL PROBLEMS   Wears glasses        PAST SURGICAL HISTORY: Past Surgical History:  Procedure Laterality Date   ABDOMINAL HYSTERECTOMY     AV FISTULA PLACEMENT Left 12/01/2021  Procedure: INSERTION OF LEFT ARM ARTERIOVENOUS (AV) GORE-TEX GRAFT;  Surgeon: Serafina Mitchell, MD;  Location: [facility];  Service: Vascular;  Laterality: Left;   BACK SURGERY  0454   [place] Left 04/05/2020   Procedure: BASILIC VEIN TRANSPOSITION FIRST STAGE;  Surgeon: Angelia Mould, MD;  Location: [facility];  Service: Vascular;  Laterality: Left;   BRONCHIAL BIOPSY  01/23/2022   Procedure: BRONCHIAL BIOPSIES;  Surgeon: Garner Nash, DO;  Location: [facility];  Service: Pulmonary;;   BRONCHIAL NEEDLE ASPIRATION BIOPSY  01/23/2022   Procedure: BRONCHIAL NEEDLE ASPIRATION BIOPSIES;  Surgeon: Garner Nash, DO;  Location: [facility];  Service: Pulmonary;;   COLON SURGERY  1995   DUE TO POLYP   DILATION AND CURETTAGE OF UTERUS     ENDOBRONCHIAL ULTRASOUND  01/23/2022   Procedure: ENDOBRONCHIAL ULTRASOUND;  Surgeon: Garner Nash, DO;  Location: [facility];  Service: Pulmonary;;   ENTEROSCOPY N/A 07/10/2021   Procedure: ENTEROSCOPY;  Surgeon: Ronnette Juniper, MD;  Location: [facility];  Service: Gastroenterology;  Laterality: N/A;   ESOPHAGOGASTRODUODENOSCOPY N/A 01/25/2021   Procedure: ESOPHAGOGASTRODUODENOSCOPY (EGD);  Surgeon: Clarene Essex, MD;  Location: [facility];  Service: Endoscopy;  Laterality: N/A;   FINE NEEDLE ASPIRATION  01/23/2022   Procedure: FINE NEEDLE ASPIRATION (FNA) LINEAR;  Surgeon: Garner Nash, DO;  Location: [facility];  Service: Pulmonary;;   FOREARM FRACTURE SURGERY     Left arm   GIVENS CAPSULE STUDY N/A 07/07/2021   Procedure: GIVENS CAPSULE STUDY;  Surgeon: Clarene Essex, MD;  Location: [facility];  Service: Endoscopy;  Laterality: N/A;   HERNIA REPAIR     w/ mesh   HOT HEMOSTASIS N/A 07/10/2021   Procedure: HOT HEMOSTASIS (ARGON PLASMA COAGULATION/BICAP);  Surgeon: Ronnette Juniper, MD;  Location: [facility];  Service: Gastroenterology;  Laterality: N/A;   INCISION AND DRAINAGE ABSCESS Left 02/04/2013   Procedure: INCISION AND DRAINAGE LEFT BUTTOCK ABSCESS; INCISION AND DRAINAGE LEFT BREAST ABSCESS;  Surgeon: Harl Bowie, MD;  Location: [facility];  Service: General;  Laterality: Left;   INCISION AND DRAINAGE ABSCESS N/A 02/12/2013   Procedure: INCISION AND DEBRIDEMENT BUTTOCK WOUND ;  Surgeon: [name]. Georgette Dover, MD;  Location: [facility];  Service: General;  Laterality: N/A;   INCISION AND DRAINAGE ABSCESS N/A 02/14/2013   Procedure: INCISION AND DRAINAGE/DRESSING CHANGE;  Surgeon: Harl Bowie, MD;  Location: [facility];  Service: General;  Laterality: N/A;   INCISION AND DRAINAGE ABSCESS N/A 03/21/2015   Procedure: INCISION AND DRAINAGE PUBIC ABSCESS;  Surgeon: Excell Seltzer, MD;   Location: [facility];  Service: General;  Laterality: N/A;   INCISION AND DRAINAGE PERIRECTAL ABSCESS Left 02/10/2013   Procedure: IRRIGATION AND DEBRIDEMENT OF BUTTOCK/PERINEAL ABSCESS;  Surgeon: [name]. Georgette Dover, MD;  Location: [facility];  Service: General;  Laterality: Left;   INCISION AND DRAINAGE PERIRECTAL ABSCESS N/A 02/16/2013   Procedure: IRRIGATION AND DEBRIDEMENT PERINEAL ABSCESS;  Surgeon: Zenovia Jarred, MD;  Location: [facility];  Service: General;  Laterality: N/A;   IR FLUORO GUIDE CV LINE RIGHT  11/30/2021   IR US GUIDE VASC ACCESS RIGHT  11/30/2021   IRRIGATION AND DEBRIDEMENT ABSCESS N/A 02/06/2013   Procedure: IRRIGATION AND DEBRIDEMENT BUTTOCK ABSCESS AND DRESSING CHANGE;  Surgeon: Harl Bowie, MD;  Location: [facility];  Service: General;  Laterality: N/A;   IRRIGATION AND DEBRIDEMENT ABSCESS Left 02/08/2013   Procedure: IRRIGATION AND DEBRIDEMENT ABSCESS/DRESSING CHANGE;  Surgeon: Gwenyth Ober, MD;  Location: [facility];  Service: General;  Laterality: Left;   JOINT REPLACEMENT  2010 / 2012    LAPAROSCOPIC CHOLECYSTECTOMY     LEFT HEART CATHETERIZATION WITH CORONARY ANGIOGRAM N/A 07/02/2014   Procedure: LEFT HEART CATHETERIZATION WITH CORONARY ANGIOGRAM;  Surgeon: Lorretta Harp, MD;  Location: [facility];  Service: Cardiovascular;  Laterality: N/A;   LOWER EXTREMITY ANGIOGRAM Right 04/20/2016   Procedure: Lower Extremity Angiogram;  Surgeon: Elam Dutch, MD;  Location: [facility] LAB;  Service: Cardiovascular;  Laterality: Right;   MULTIPLE TOOTH EXTRACTIONS     PERIPHERAL VASCULAR CATHETERIZATION N/A 04/20/2016   Procedure: Abdominal Aortogram;  Surgeon: Elam Dutch, MD;  Location: [place] [facility];  Service: Cardiovascular;  Laterality: N/A;   PERIPHERAL VASCULAR CATHETERIZATION Right 04/20/2016   Procedure: Peripheral Vascular Intervention;  Surgeon: Elam Dutch, MD;  Location: [facility];  Service: Cardiovascular;  Laterality: Right;  popiteal   RIGHT HEART  CATH N/A 10/27/2018   Procedure: RIGHT HEART CATH;  Surgeon: Larey Dresser, MD;  Location: [facility];  Service: Cardiovascular;  Laterality: N/A;   RIGHT HEART CATH N/A 03/20/2019   Procedure: RIGHT HEART CATH;  Surgeon: Larey Dresser, MD;  Location: [facility] [facility];  Service: Cardiovascular;  Laterality: N/A;   THYROIDECTOMY     TOTAL HIP REVISION Right 08/11/2014   Procedure: RIGHT ACETABULAR REVISION;  Surgeon: Gearlean Alf, MD;  Location: [facility];  Service: Orthopedics;  Laterality: Right;   VASCULAR SURGERY     VIDEO BRONCHOSCOPY WITH RADIAL ENDOBRONCHIAL ULTRASOUND  01/23/2022   Procedure: RADIAL ENDOBRONCHIAL ULTRASOUND;  Surgeon: Garner Nash, DO;  Location: [facility];  Service: Pulmonary;;     FAMILY HISTORY:  Family History  Problem Relation Age of Onset   Diabetes Brother    Cardiomyopathy Mother    Heart disease Mother    Cancer Father      SOCIAL HISTORY:  reports that she quit smoking about 9 months ago. Her smoking use included cigarettes. She has a 10.00 pack-year smoking history. She has been exposed to tobacco smoke. She has quit using smokeless tobacco. She reports current alcohol use of about 1.0 standard drink of alcohol per week. She reports that she does not use drugs.  The patient is single and lives in [place].  She is wheelchair dependent at home as well.  Her son helps to look in on her but she is also looking at having an aide come to her home to help her.  She enjoys watching TV programs.   ALLERGIES: Lisinopril, Nebivolol, Ace inhibitors, and Morphine and related   MEDICATIONS:  Current Outpatient Medications  Medication Sig Dispense Refill   acetaminophen (TYLENOL) 500 MG tablet Take 1,000 mg by mouth 3 (three) times daily.     albuterol (PROVENTIL) (2.5 MG/3ML) 0.083% nebulizer solution Take 3 mLs (2.5 mg total) by nebulization every 6 (six) hours as needed for wheezing or shortness of breath. 75 mL 12   albuterol (VENTOLIN  HFA) 108 (90 Base) MCG/ACT inhaler Inhale 2 puffs into the lungs every 6 (six) hours as needed for shortness of breath (COPD).     allopurinol (ZYLOPRIM) 100 MG tablet Take 1 tablet (100 mg total) by mouth daily. 30 tablet 0   budesonide-formoterol (SYMBICORT) 160-4.5 MCG/ACT inhaler Inhale 2 puffs into the lungs See admin instructions. Tues., Thurs, and Saturday before dialysis     carvedilol (COREG) 12.5 MG tablet Take 1 tablet (12.5 mg total) by mouth 2 (two) times daily. [place]  tablet 0   cetirizine (ZYRTEC) 10 MG tablet Take 10 mg by mouth daily.     clopidogrel (PLAVIX) 75 MG tablet Take 75 mg by mouth daily.     fluticasone (FLONASE) 50 MCG/ACT nasal spray Place 1 spray into both nostrils daily.     Insulin Lispro Prot & Lispro (HUMALOG 75/25 MIX) (75-25) 100 UNIT/ML Kwikpen Inject 10-20 Units into the skin See admin instructions. Take 10 units is the morning and 20 units at bedtime     levothyroxine (SYNTHROID) 200 MCG tablet Take 1 tablet (200 mcg total) by mouth daily at 6 (six) AM. 30 tablet 0   lidocaine (LIDODERM) 5 % 2 patches every 12 (twelve) hours. 12 hours off 1 patch Each side of lower back     lidocaine-prilocaine (EMLA) cream SMARTSIG:sparingly Topical 3 Times a Week     lubiprostone (AMITIZA) 24 MCG capsule Take 24 mcg by mouth 2 (two) times daily.     magnesium oxide (MAG-OX) 400 (241.3 Mg) MG tablet Take 1 tablet (400 mg total) by mouth 2 (two) times daily. 30 tablet 0   multivitamin (RENA-VIT) TABS tablet Take 1 tablet by mouth daily.     OVER THE COUNTER MEDICATION Place 1 drop into both eyes daily as needed (Itching eye). Refresh opt lubricant eye drops     pantoprazole (PROTONIX) 40 MG tablet Take 1 tablet (40 mg total) by mouth 2 (two) times daily before a meal. (Patient taking differently: Take 40 mg by mouth 2 (two) times daily.)     polyethylene glycol (MIRALAX / GLYCOLAX) 17 g packet Take 17 g by mouth daily. 14 each 0   sevelamer carbonate (RENVELA) 800 MG tablet Take  800 mg by mouth 3 (three) times daily with meals.     thiamine (VITAMIN B-1) 100 MG tablet Take 100 mg by mouth daily.     No current facility-administered medications for this encounter.     REVIEW OF SYSTEMS: On review of systems, the patient reports that she continues to have shortness of breath with exertion but denies any changes in her function at rest.  She denies any productive cough hemoptysis or unintended weight changes.  No other complaints are verbalized.  PHYSICAL EXAM:  Wt Readings from Last 3 Encounters:  07/18/22 226 lb (102.5 kg)  07/11/22 220 lb (99.8 kg)  06/27/22 221 lb 14.4 oz (100.7 kg)   Temp Readings from Last 3 Encounters:  07/18/22 (!) 96.4 F (35.8 C) (Temporal)  07/11/22 97.8 F (36.6 C) (Oral)  06/27/22 98.4 F (36.9 C)   BP Readings from Last 3 Encounters:  07/18/22 131/67  07/11/22 131/71  06/27/22 (!) 140/53   Pulse Readings from Last 3 Encounters:  07/18/22 84  07/11/22 94  06/27/22 100   Pain Assessment Pain Score: 0-No pain/10  In general this is an obese, chronically ill appearing African-American female in no acute distress.  She's alert and oriented x4 and appropriate throughout the examination. Cardiopulmonary assessment is negative for acute distress and she exhibits normal effort.     ECOG = 2  0 - Asymptomatic (Fully active, able to carry on all predisease activities without restriction)  1 - Symptomatic but completely ambulatory (Restricted in physically strenuous activity but ambulatory and able to carry out work of a light or sedentary nature. For example, light housework, office work)  2 - Symptomatic, <50% in bed during the day (Ambulatory and capable of all self care but unable to carry out any work activities. Up and  about more than 50% of waking hours)  3 - Symptomatic, >50% in bed, but not bedbound (Capable of only limited self-care, confined to bed or chair 50% or more of waking hours)  4 - Bedbound (Completely  disabled. Cannot carry on any self-care. Totally confined to bed or chair)  5 - Death   Eustace Pen MM, Creech RH, Tormey DC, et al. [phone]). "Toxicity and response criteria of the [facility]". [place] Oncol. 5 (6): 649-55    LABORATORY DATA:  Lab Results  Component Value Date   WBC 9.5 06/25/2022   HGB 10.3 (L) 06/25/2022   HCT 34.8 (L) 06/25/2022   MCV 99.1 06/25/2022   PLT 227 06/25/2022   Lab Results  Component Value Date   NA 140 06/25/2022   K 4.4 06/25/2022   CL 99 06/25/2022   CO2 28 06/25/2022   Lab Results  Component Value Date   ALT 9 06/25/2022   AST 14 (L) 06/25/2022   ALKPHOS 171 (H) 06/25/2022   BILITOT 0.3 06/25/2022      RADIOGRAPHY: NM PET Image Restage (PS) Skull Base to Thigh (F-18 FDG)  Result Date: 07/10/2022 CLINICAL DATA:  Subsequent treatment strategy for non-small cell lung cancer. EXAM: NUCLEAR MEDICINE PET SKULL BASE TO THIGH TECHNIQUE: 10.9 mCi F-18 FDG was injected intravenously. Full-ring PET imaging was performed from the skull base to thigh after the radiotracer. CT data was obtained and used for attenuation correction and anatomic localization. Fasting blood glucose: 190 mg/dl COMPARISON:  CT 11/28/2021, PET-CT 12/29/2021 FINDINGS: Mediastinal blood pool activity: SUV max 2 point Liver activity: SUV max NA NECK: No hypermetabolic lymph nodes in the neck. Incidental CT findings: None. CHEST: The treated nodule in superior segment of the LEFT lower lobe has no significant metabolic activity. There is decrease in size of the nodule measuring 18 mm compared to 23 mm. The 2 adjacent LEFT lobe pulmonary nodules measuring 19 mm and 19 mm on images 86 and 89 series 4 of intense metabolic activity with SUV max equal 9.0. No hypermetabolic mediastinal lymph nodes. Incidental CT findings: None. ABDOMEN/PELVIS: No abnormal hypermetabolic activity within the liver, pancreas, adrenal glands, or spleen. No hypermetabolic lymph nodes in the  abdomen or pelvis. Incidental CT findings: None. SKELETON: No focal hypermetabolic activity to suggest skeletal metastasis. Incidental CT findings: None. IMPRESSION: 1. Two adjacent intensely hypermetabolic nodules in the inferior LEFT lower lobe consistent with lung cancer recurrence/metastasis versus metachronous bronchogenic carcinoma 2. No significant metabolic activity and decrease in size of treated LEFT lower lobe nodular bronchogenic carcinoma. 3. No evidence of distant metastatic disease. Electronically Signed   By: Suzy Bouchard M.D.   On: 07/10/2022 11:16   CT Chest Wo Contrast  Result Date: 06/25/2022 CLINICAL DATA:  Stage IA left squamous cell lung cancer diagnosed April 2023 completed radiation therapy 04/06/2022. Restaging. * Tracking Code: BO * EXAM: CT CHEST WITHOUT CONTRAST TECHNIQUE: Multidetector CT imaging of the chest was performed following the standard protocol without IV contrast. RADIATION DOSE REDUCTION: This exam was performed according to the departmental dose-optimization program which includes automated exposure control, adjustment of the mA and/or kV according to patient size and/or use of iterative reconstruction technique. COMPARISON:  12/29/2021 PET-CT.  11/28/2021 chest CT. FINDINGS: Cardiovascular: Borderline mild cardiomegaly. No significant pericardial effusion/thickening. Left anterior descending and left circumflex coronary atherosclerosis. Atherosclerotic nonaneurysmal thoracic aorta. Normal caliber pulmonary arteries. Mediastinum/Nodes: Apparent total thyroidectomy. Unremarkable esophagus. No pathologically enlarged axillary, mediastinal or hilar lymph nodes, noting limited sensitivity  for the detection of hilar adenopathy on this noncontrast study. Lungs/Pleura: No pneumothorax. No pleural effusion. Mild paraseptal and centrilobular emphysema. Previously described indistinct 0.5 cm right middle lobe pulmonary nodule on 11/28/2021 chest CT study has resolved.  Ill-defined 2.2 x 1.5 cm solid central left lower lobe pulmonary nodule (series 7/image 80), decreased from 2.8 x 1.8 cm on 11/28/2021 chest CT. New solid 1.6 cm basilar left lower lobe pulmonary nodule (series 7/image 110) and separate new solid 1.5 cm medial left lower lobe pulmonary nodule (series 7/image 102). Upper abdomen: Simple exophytic 1.7 cm anterior upper right renal cyst and simple exophytic anterolateral left upper lobe 2.5 cm cyst, for which no imaging follow-up is recommended. Generalized thickening of the bilateral adrenal glands without discrete adrenal nodules, unchanged, favoring adrenal hyperplasia. Musculoskeletal: No aggressive appearing focal osseous lesions. Marked thoracic spondylosis. Stable small sclerotic lesion in the posterior T3 vertebral body (series 6/image 87), favor a bone island. Healed deformities within multiple posterior and anterior right ribs and within multiple anterior left ribs. IMPRESSION: 1. The treated primary central left lower lobe neoplasm is decreased in size. 2. Two new separate solid basilar left lower lobe pulmonary nodules, largest 1.6 cm, compatible with new pulmonary metastases. 3. Previously described indistinct 0.5 cm right middle lobe pulmonary nodule on 11/28/2021 chest CT study has resolved. 4. No thoracic adenopathy. 5. Borderline mild cardiomegaly. Two-vessel coronary atherosclerosis. 6. Aortic Atherosclerosis (ICD10-I70.0) and Emphysema (ICD10-J43.9). Electronically Signed   By: Ilona Sorrel M.D.   On: 06/25/2022 21:32       IMPRESSION/PLAN: 1. Stage IA3, cT1cN0M0, NSCLC, squamous cell carcinoma of the LLL.The patient's area of prior treatment in the LL has remained stable and is not hypermetabolic on PET scan so this will be followed in active surveillance with Dr. Julien Nordmann. 2. To new PET positive nodules in the left lower lobe adjacent to the site of prior treatment as per #1.  Dr. Lisbeth Renshaw discusses the findings from her recent imaging and indeed  confirms that these 2 areas that were seen on CT scan and PET are separate from the area of her recent treatment this past summer.  He does encourage her to pursue discussion about additional bronchoscopic assessment with Dr. Valeta Harms.  He is in favor of considering stereotactic body radiotherapy to these 2 lesions.  We will follow-up with decision making with Dr. Valeta Harms who plans to see her on 07/30/2022.  We did review the rationale for treatment and discussed the risks, benefits, short and long-term effects of therapy.  We also discussed the logistics of planning treatment in proximity to prior radiotherapy.  Dr. Lisbeth Renshaw anticipates a course of approximately 5 fractions but this could be determined if there is significant overlap of her prior treatment when she would come for simulation.  She is in agreement.  We will touch base with her after her visit with Dr. Valeta Harms to determine next steps towards therapy.  In a visit lasting 45 minutes, greater than 50% of the time was spent face to face discussing the patient's condition, in preparation for the discussion, and coordinating the patient's care.   The above documentation reflects my direct findings during this shared patient visit. Please see the separate note by Dr. Lisbeth Renshaw on this date for the remainder of the patient's plan of care.     Carola Rhine, [facility]     **Disclaimer: This note was dictated with voice recognition software. Similar sounding words can inadvertently be transcribed and this note may contain transcription errors  which may not have been corrected upon publication of note.**

## 2022-07-23 ENCOUNTER — Ambulatory Visit: Payer: 59 | Admitting: Pulmonary Disease

## 2022-07-25 ENCOUNTER — Ambulatory Visit
Admission: RE | Admit: 2022-07-25 | Discharge: 2022-07-25 | Disposition: A | Discharge status: Home / Self Care | Payer: 59 | Source: Ambulatory Visit | Attending: Infectious Diseases | Admitting: Infectious Diseases

## 2022-07-25 DIAGNOSIS — Z1231 Encounter for screening mammogram for malignant neoplasm of breast: Secondary | ICD-10-CM

## 2022-07-27 ENCOUNTER — Other Ambulatory Visit: Payer: Self-pay | Admitting: Infectious Diseases

## 2022-07-27 DIAGNOSIS — R928 Other abnormal and inconclusive findings on diagnostic imaging of breast: Secondary | ICD-10-CM

## 2022-07-30 ENCOUNTER — Encounter: Payer: Self-pay | Admitting: Pulmonary Disease

## 2022-07-30 ENCOUNTER — Ambulatory Visit (INDEPENDENT_AMBULATORY_CARE_PROVIDER_SITE_OTHER): Payer: 59 | Admitting: Pulmonary Disease

## 2022-07-30 VITALS — BP 140/66 | HR 77 | Temp 98.5°F | Ht 63.0 in | Wt 204.0 lb

## 2022-07-30 DIAGNOSIS — R918 Other nonspecific abnormal finding of lung field: Secondary | ICD-10-CM

## 2022-07-30 DIAGNOSIS — Z85118 Personal history of other malignant neoplasm of bronchus and lung: Secondary | ICD-10-CM | POA: Diagnosis not present

## 2022-07-30 NOTE — Patient Instructions (Signed)
Thank you for visiting Dr. Valeta Harms at [facility]. Today we recommend the following: Orders Placed This Encounter  Procedures   Procedural/ Surgical Case Request: ROBOTIC ASSISTED NAVIGATIONAL BRONCHOSCOPY, VIDEO BRONCHOSCOPY WITH ENDOBRONCHIAL ULTRASOUND   CT Super D Chest Wo Contrast   Ambulatory referral to Pulmonology   Bronchoscopy on 08/07/2022 STOP [place] TOMORROW   Return in about 15 days (around 08/14/2022) for with Eric Form, NP.    Please do your part to reduce the spread of COVID-19.

## 2022-07-30 NOTE — H&P (View-Only) (Signed)
Synopsis: Referred in March 2023 for lung nodule by Antony Blackbird, MD  Subjective:   PATIENT ID: [name] [name] GENDER: female DOB: 11-30-1950, MRN: [id number]  Chief Complaint  Patient presents with   Follow-up    PET scans review, SOB, sinus drainage with cough. 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  Previously followed left lower lobe pulmonary nodule that was first identified in 2020.  Recent had a hospital admission in February 2023.  Repeat CT scan of the chest at 2.8 x 1.8 cm left lower lobe pulmonary nodule concerning for primary lung cancer.  She does have continued weight loss.  She does not have hemoptysis.  I saw her in the hospital in consultation with recommendations for a PET scan.  Patient had the nuclear medicine PET scan after she was discharged from the hospital.  CT PET scan was completed on 12/29/2021 which revealed a hypermetabolic 3 cm left lower lobe mass with an SUV max of 11.2 concerning for malignancy.  OV 07/30/2022: this is a 71 year old seen for evaluation after PET scan.  She had a PET scan restaging with Dr. Earlie Server.  She was diagnosed with a left lower lobe malignancy in the spring.  She had a repeat PET scan that now shows 2 nodules that are hypermetabolic in the lower lobe concerning for recurrence of malignancy.  Patient was referred back for consideration for repeat biopsy.There is now 2 adjacent intensely hypermetabolic nodules from the left lower lobe position concerning for intralobar metastasis.Initially patient underwent radiation treatments in June with Dr. Lisbeth Renshaw for a stage Ia, T1c, N0, M0 non-small cell lung cancer.  Dr. Earlie Server now requesting repeat bronchoscopy and biopsy of the 2 new nodules as they appear to be recurrence.    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   Full dentures    GERD (gastroesophageal reflux disease)    Gout    History of hiatal hernia    History of transfusion    Hx MRSA infection    abscess left groin   Hyperlipidemia    Hypertension    Hypothyroid    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HCC)    Peripheral vascular disease (HCC)    Sleep apnea    UNABLE TO TOLERATE C PAP   Stress incontinence    TIA (transient ischemic attack)     X2 NO RESIDUAL PROBLEMS   Wears glasses      Family History  Problem Relation Age of Onset   Diabetes Brother    Cardiomyopathy Mother    Heart disease Mother    Cancer Father      Past Surgical History:  Procedure Laterality Date   ABDOMINAL HYSTERECTOMY     AV FISTULA PLACEMENT Left 12/01/2021   Procedure: INSERTION OF LEFT ARM ARTERIOVENOUS (AV) GORE-TEX GRAFT;  Surgeon: Serafina Mitchell, MD;  Location: [facility]  OR;  Service: Vascular;  Laterality: Left;   BACK SURGERY  5573   BASCILIC VEIN TRANSPOSITION Left 04/05/2020   Procedure: BASILIC VEIN TRANSPOSITION FIRST STAGE;  Surgeon: Angelia Mould, MD;  Location: [place];  Service: Vascular;  Laterality: Left;   BRONCHIAL BIOPSY  01/23/2022   Procedure: BRONCHIAL BIOPSIES;  Surgeon: Garner Nash, DO;  Location: [place];  Service: Pulmonary;;   BRONCHIAL NEEDLE ASPIRATION BIOPSY  01/23/2022   Procedure: BRONCHIAL NEEDLE ASPIRATION BIOPSIES;  Surgeon: Garner Nash, DO;  Location: [place] [facility];  Service: Pulmonary;;   COLON SURGERY  1995   DUE TO POLYP   DILATION AND CURETTAGE OF UTERUS     ENDOBRONCHIAL ULTRASOUND   01/23/2022   Procedure: ENDOBRONCHIAL ULTRASOUND;  Surgeon: Garner Nash, DO;  Location: [place] [facility];  Service: Pulmonary;;   ENTEROSCOPY N/A 07/10/2021   Procedure: ENTEROSCOPY;  Surgeon: Ronnette Juniper, MD;  Location: [place];  Service: Gastroenterology;  Laterality: N/A;   ESOPHAGOGASTRODUODENOSCOPY N/A 01/25/2021   Procedure: ESOPHAGOGASTRODUODENOSCOPY (EGD);  Surgeon: Clarene Essex, MD;  Location: [facility];  Service: Endoscopy;  Laterality: N/A;   FINE NEEDLE ASPIRATION  01/23/2022   Procedure: FINE NEEDLE ASPIRATION (FNA) LINEAR;  Surgeon: Garner Nash, DO;  Location: [facility];  Service: Pulmonary;;   FOREARM FRACTURE SURGERY     Left arm   GIVENS CAPSULE STUDY N/A 07/07/2021   Procedure: GIVENS CAPSULE STUDY;  Surgeon: Clarene Essex, MD;  Location: [place];  Service: Endoscopy;  Laterality: N/A;   HERNIA REPAIR     w/ mesh   HOT HEMOSTASIS N/A 07/10/2021   Procedure: HOT HEMOSTASIS (ARGON PLASMA COAGULATION/BICAP);  Surgeon: Ronnette Juniper, MD;  Location: [place];  Service: Gastroenterology;  Laterality: N/A;   INCISION AND DRAINAGE ABSCESS Left 02/04/2013   Procedure: INCISION AND DRAINAGE LEFT BUTTOCK ABSCESS; INCISION AND DRAINAGE LEFT BREAST ABSCESS;  Surgeon: Harl Bowie, MD;  Location: [place];  Service: General;  Laterality: Left;   INCISION AND DRAINAGE ABSCESS N/A 02/12/2013   Procedure: INCISION AND DEBRIDEMENT BUTTOCK WOUND ;  Surgeon: [name]. Georgette Dover, MD;  Location: [place];  Service: General;  Laterality: N/A;   INCISION AND DRAINAGE ABSCESS N/A 02/14/2013   Procedure: INCISION AND DRAINAGE/DRESSING CHANGE;  Surgeon: Harl Bowie, MD;  Location: [place];  Service: General;  Laterality: N/A;   INCISION AND DRAINAGE ABSCESS N/A 03/21/2015   Procedure: INCISION AND DRAINAGE PUBIC ABSCESS;  Surgeon: Excell Seltzer, MD;  Location: [facility];  Service: General;  Laterality: N/A;   INCISION AND DRAINAGE PERIRECTAL ABSCESS Left 02/10/2013   Procedure: IRRIGATION  AND DEBRIDEMENT OF BUTTOCK/PERINEAL ABSCESS;  Surgeon: [name]. Georgette Dover, MD;  Location: [place];  Service: General;  Laterality: Left;   INCISION AND DRAINAGE PERIRECTAL ABSCESS N/A 02/16/2013   Procedure: IRRIGATION AND DEBRIDEMENT PERINEAL ABSCESS;  Surgeon: Zenovia Jarred, MD;  Location: [place];  Service: General;  Laterality: N/A;   IR FLUORO GUIDE CV LINE RIGHT  11/30/2021   IR US GUIDE VASC ACCESS RIGHT  11/30/2021   IRRIGATION AND DEBRIDEMENT ABSCESS N/A 02/06/2013   Procedure: IRRIGATION AND DEBRIDEMENT BUTTOCK ABSCESS AND DRESSING CHANGE;  Surgeon: Harl Bowie, MD;  Location: [place];  Service: General;  Laterality: N/A;   IRRIGATION AND DEBRIDEMENT ABSCESS Left 02/08/2013   Procedure: IRRIGATION AND DEBRIDEMENT ABSCESS/DRESSING CHANGE;  Surgeon: Gwenyth Ober, MD;  Location: [place];  Service: General;  Laterality: Left;   JOINT REPLACEMENT  2010 / 2012    [place]  LEFT HEART CATHETERIZATION WITH CORONARY ANGIOGRAM N/A 07/02/2014   Procedure: LEFT HEART CATHETERIZATION WITH CORONARY ANGIOGRAM;  Surgeon: Lorretta Harp, MD;  Location: [facility] LAB;  Service: Cardiovascular;  Laterality: N/A;   LOWER EXTREMITY ANGIOGRAM Right 04/20/2016   Procedure: Lower Extremity Angiogram;  Surgeon: Elam Dutch, MD;  Location: [place] CV LAB;  Service: Cardiovascular;  Laterality: Right;   MULTIPLE TOOTH EXTRACTIONS     PERIPHERAL VASCULAR CATHETERIZATION N/A 04/20/2016   Procedure: Abdominal Aortogram;  Surgeon: Elam Dutch, MD;  Location: [place] [facility];  Service: Cardiovascular;  Laterality: N/A;   PERIPHERAL VASCULAR CATHETERIZATION Right 04/20/2016   Procedure: Peripheral Vascular Intervention;  Surgeon: Elam Dutch, MD;  Location: [place] [facility];  Service: Cardiovascular;  Laterality: Right;  popiteal   RIGHT HEART CATH N/A 10/27/2018   Procedure: RIGHT HEART CATH;  Surgeon: Larey Dresser, MD;  Location: [place] [facility];  Service: Cardiovascular;   Laterality: N/A;   RIGHT HEART CATH N/A 03/20/2019   Procedure: RIGHT HEART CATH;  Surgeon: Larey Dresser, MD;  Location: [place] [facility];  Service: Cardiovascular;  Laterality: N/A;   THYROIDECTOMY     TOTAL HIP REVISION Right 08/11/2014   Procedure: RIGHT ACETABULAR REVISION;  Surgeon: Gearlean Alf, MD;  Location: [facility];  Service: Orthopedics;  Laterality: Right;   VASCULAR SURGERY     VIDEO BRONCHOSCOPY WITH RADIAL ENDOBRONCHIAL ULTRASOUND  01/23/2022   Procedure: RADIAL ENDOBRONCHIAL ULTRASOUND;  Surgeon: Garner Nash, DO;  Location: [facility];  Service: Pulmonary;;    Social History   Socioeconomic History   Marital status: Legally Separated    Spouse name: Not on file   Number of children: 1   Years of education: college   Highest education level: Not on file  Occupational History    Comment: Disabled  Tobacco Use   Smoking status: Former    Packs/day: 0.25    Years: 40.00    Total pack years: 10.00    Types: Cigarettes    Quit date: 10/2021    Years since quitting: 0.8    Passive exposure: Past   Smokeless tobacco: Former  Scientific laboratory technician Use: Never used  Substance and Sexual Activity   Alcohol use: Yes    Alcohol/week: 1.0 standard drink of alcohol    Types: 1 Standard drinks or equivalent per week    Comment: OCC   Drug use: No   Sexual activity: Not Currently    Birth control/protection: Surgical    Comment: Hysterectomy  Other Topics Concern   Not on file  Social History Narrative   ** Merged History Encounter **       Patient is separated  and lives at home with her friend. Patient is disabled. Education one year of college Right handed Caffeine two cups daily.     Social Determinants of Health   Financial Resource Strain: Not on file  Food Insecurity: Not on file  Transportation Needs: Not on file  Physical Activity: Not on file  Stress: Not on file  Social Connections: Not on file  Intimate Partner Violence: Not At Risk  (03/07/2022)   Humiliation, Afraid, Rape, and Kick questionnaire    Fear of Current or Ex-Partner: No    Emotionally Abused: No    Physically Abused: No    Sexually Abused: No     Allergies  Allergen Reactions   Lisinopril Swelling and Other (See Comments)   Nebivolol Swelling  Chest pain (reaction to Bystolic)   Ace Inhibitors Swelling and Other (See Comments)    Tongue swell   Morphine And Related Itching     Outpatient Medications Prior to Visit  Medication Sig Dispense Refill   acetaminophen (TYLENOL) 500 MG tablet Take 1,000 mg by mouth 3 (three) times daily.     albuterol (PROVENTIL) (2.5 MG/3ML) 0.083% nebulizer solution Take 3 mLs (2.5 mg total) by nebulization every 6 (six) hours as needed for wheezing or shortness of breath. 75 mL 12   albuterol (VENTOLIN HFA) 108 (90 Base) MCG/ACT inhaler Inhale 2 puffs into the lungs every 6 (six) hours as needed for shortness of breath (COPD).     allopurinol (ZYLOPRIM) 100 MG tablet Take 1 tablet (100 mg total) by mouth daily. 30 tablet 0   budesonide-formoterol (SYMBICORT) 160-4.5 MCG/ACT inhaler Inhale 2 puffs into the lungs See admin instructions. Tues., Thurs, and Saturday before dialysis     carvedilol (COREG) 12.5 MG tablet Take 1 tablet (12.5 mg total) by mouth 2 (two) times daily. 60 tablet 0   cetirizine (ZYRTEC) 10 MG tablet Take 10 mg by mouth daily.     clopidogrel (PLAVIX) 75 MG tablet Take 75 mg by mouth daily.     fluticasone (FLONASE) 50 MCG/ACT nasal spray Place 1 spray into both nostrils daily.     Insulin Lispro Prot & Lispro (HUMALOG 75/25 MIX) (75-25) 100 UNIT/ML Kwikpen Inject 10-20 Units into the skin See admin instructions. Take 10 units is the morning and 20 units at bedtime     levothyroxine (SYNTHROID) 200 MCG tablet Take 1 tablet (200 mcg total) by mouth daily at 6 (six) AM. 30 tablet 0   lidocaine (LIDODERM) 5 % 2 patches every 12 (twelve) hours. 12 hours off 1 patch Each side of lower back      lidocaine-prilocaine (EMLA) cream SMARTSIG:sparingly Topical 3 Times a Week     magnesium oxide (MAG-OX) 400 (241.3 Mg) MG tablet Take 1 tablet (400 mg total) by mouth 2 (two) times daily. 30 tablet 0   multivitamin (RENA-VIT) TABS tablet Take 1 tablet by mouth daily.     sevelamer carbonate (RENVELA) 800 MG tablet Take 800 mg by mouth 3 (three) times daily with meals.     thiamine (VITAMIN B-1) 100 MG tablet Take 100 mg by mouth daily.     lubiprostone (AMITIZA) 24 MCG capsule Take 24 mcg by mouth 2 (two) times daily.     OVER THE COUNTER MEDICATION Place 1 drop into both eyes daily as needed (Itching eye). Refresh opt lubricant eye drops     pantoprazole (PROTONIX) 40 MG tablet Take 1 tablet (40 mg total) by mouth 2 (two) times daily before a meal. (Patient taking differently: Take 40 mg by mouth 2 (two) times daily.)     polyethylene glycol (MIRALAX / GLYCOLAX) 17 g packet Take 17 g by mouth daily. (Patient not taking: Reported on 07/30/2022) 14 each 0   No facility-administered medications prior to visit.    Review of Systems  Constitutional:  Negative for chills, fever, malaise/fatigue and weight loss.  HENT:  Negative for hearing loss, sore throat and tinnitus.   Eyes:  Negative for blurred vision and double vision.  Respiratory:  Positive for cough, hemoptysis and shortness of breath. Negative for sputum production, wheezing and stridor.   Cardiovascular:  Negative for chest pain, palpitations, orthopnea, leg swelling and PND.  Gastrointestinal:  Negative for abdominal pain, constipation, diarrhea, heartburn, nausea and vomiting.  Genitourinary:  Negative for dysuria, hematuria and urgency.  Musculoskeletal:  Negative for joint pain and myalgias.  Skin:  Negative for itching and rash.  Neurological:  Negative for dizziness, tingling, weakness and headaches.  Endo/Heme/Allergies:  Negative for environmental allergies. Does not bruise/bleed easily.  Psychiatric/Behavioral:  Negative for  depression. The patient is not nervous/anxious and does not have insomnia.   All other systems reviewed and are negative.    Objective:  Physical Exam Vitals reviewed.  Constitutional:      General: She is not in acute distress.    Appearance: She is well-developed. She is obese.  HENT:     Head: Normocephalic and atraumatic.  Eyes:     General: No scleral icterus.    Conjunctiva/sclera: Conjunctivae normal.     Pupils: Pupils are equal, round, and reactive to light.  Neck:     Vascular: No JVD.     Trachea: No tracheal deviation.  Cardiovascular:     Rate and Rhythm: Normal rate and regular rhythm.     Heart sounds: Normal heart sounds. No murmur heard. Pulmonary:     Effort: Pulmonary effort is normal. No tachypnea, accessory muscle usage or respiratory distress.     Breath sounds: No stridor. No wheezing, rhonchi or rales.  Abdominal:     General: There is no distension.     Palpations: Abdomen is soft.     Tenderness: There is no abdominal tenderness.  Musculoskeletal:        General: No tenderness.     Cervical back: Neck supple.  Lymphadenopathy:     Cervical: No cervical adenopathy.  Skin:    General: Skin is warm and dry.     Capillary Refill: Capillary refill takes less than 2 seconds.     Findings: No rash.  Neurological:     Mental Status: She is alert and oriented to person, place, and time.  Psychiatric:        Behavior: Behavior normal.      Vitals:   07/30/22 1102  BP: (!) 140/66  Pulse: 77  Temp: 98.5 F (36.9 C)  TempSrc: Oral  SpO2: 91%  Weight: 204 lb (92.5 kg)  Height: 5\' 3"  (1.6 m)   91% on RA BMI Readings from Last 3 Encounters:  07/30/22 36.14 kg/m  07/18/22 40.03 kg/m  07/11/22 38.97 kg/m   Wt Readings from Last 3 Encounters:  07/30/22 204 lb (92.5 kg)  07/18/22 226 lb (102.5 kg)  07/11/22 220 lb (99.8 kg)     CBC    Component Value Date/Time   WBC 9.5 06/25/2022 0955   WBC 13.1 (H) 12/07/2021 0630   RBC 3.51 (L)  06/25/2022 0955   HGB 10.3 (L) 06/25/2022 0955   HCT 34.8 (L) 06/25/2022 0955   HCT 24.6 (L) 10/17/2020 1235   PLT 227 06/25/2022 0955   MCV 99.1 06/25/2022 0955   MCH 29.3 06/25/2022 0955   MCHC 29.6 (L) 06/25/2022 0955   RDW 17.0 (H) 06/25/2022 0955   RDW 15.7 (H) 12/23/2013 1132   LYMPHSABS 1.0 06/25/2022 0955   LYMPHSABS 2.5 12/23/2013 1132   MONOABS 0.8 06/25/2022 0955   EOSABS 0.1 06/25/2022 0955   EOSABS 0.1 12/23/2013 1132   BASOSABS 0.0 06/25/2022 0955   BASOSABS 0.0 12/23/2013 1132      Chest Imaging: Nuclear medicine pet imaging March 7741: Hypermetabolic left lower lobe lung mass concerning for primary malignancy. The patient's images have been independently reviewed by me.    October 2023 nuclear medicine pet  imaging: 2 hypermetabolic nodules within the left lower concern lobe concerning for recurrence of malignancy. The patient's images have been independently reviewed by me.    Pulmonary Functions Testing Results:    Latest Ref Rng & Units 05/08/2022   11:50 AM  PFT Results  FVC-Pre L 1.52   FVC-Predicted Pre % 53   FVC-Post L 1.48   FVC-Predicted Post % 52   Pre FEV1/FVC % % 77   Post FEV1/FCV % % 80   FEV1-Pre L 1.17   FEV1-Predicted Pre % 54   FEV1-Post L 1.19   DLCO uncorrected ml/min/mmHg 9.41   DLCO UNC% % 50   DLCO corrected ml/min/mmHg 9.41   DLCO COR %Predicted % 50   DLVA Predicted % 65   TLC L 3.74   TLC % Predicted % 76   RV % Predicted % 97     FeNO:   Pathology:   Echocardiogram:   Heart Catheterization:     Assessment & Plan:     ICD-10-CM   1. Lung mass  R91.8 Procedural/ Surgical Case Request: ROBOTIC ASSISTED NAVIGATIONAL BRONCHOSCOPY, VIDEO BRONCHOSCOPY WITH ENDOBRONCHIAL ULTRASOUND    Ambulatory referral to Pulmonology    CT Super D Chest Wo Contrast    Novel Coronavirus, NAA ([facility])    2. Lung nodules  R91.8     3. H/O: lung cancer  Z85.118       Discussion:  This is a 71 year old obese female, former  smoker, prior one quarter of a pack a day for 40 years.  Initially had a incidental pulmonary nodule found in 2020.  No subsequent follow-up completed at that time.  She had a repeat CT chest in February 2023 which was found during her hospitalization that showed enlargement of left lower lobe nodule.  PET scan imaging showed hypermetabolic uptake with an SUV of 11.  Patient was taken for robotic assisted navigational bronchoscopy and tissue sampling which confirmed non-small cell lung cancer left lower lobe status post radiation treatments with Dr. Lisbeth Renshaw at this time.  Repeat pet imaging reveals 2 new nodules in the left lung concerning for recurrence of disease and patient was referred back for repeat bronchoscopy.  Plan: Today in the office we talked about the risk benefits alternatives of proceeding with robotic assisted bronchoscopy and tissue biopsy to include bleeding and pneumothorax. Patient is agreeable to proceed with this plan. Tentative bronchoscopy date would be 08/07/2022. Patient is on Plavix. Patient's last dose Plavix will be tomorrow. She will need to rearrange her dialysis schedule. She will get dialysis on Monday the 30th. She will call her dialysis unit and get this arranged. She will need an appointment to see SG, NP 1 week after bronchoscopy complete.  We have PCC's help with scheduling.   Current Outpatient Medications:    acetaminophen (TYLENOL) 500 MG tablet, Take 1,000 mg by mouth 3 (three) times daily., Disp: , Rfl:    albuterol (PROVENTIL) (2.5 MG/3ML) 0.083% nebulizer solution, Take 3 mLs (2.5 mg total) by nebulization every 6 (six) hours as needed for wheezing or shortness of breath., Disp: 75 mL, Rfl: 12   albuterol (VENTOLIN HFA) 108 (90 Base) MCG/ACT inhaler, Inhale 2 puffs into the lungs every 6 (six) hours as needed for shortness of breath (COPD)., Disp: , Rfl:    allopurinol (ZYLOPRIM) 100 MG tablet, Take 1 tablet (100 mg total) by mouth daily., Disp: 30  tablet, Rfl: 0   budesonide-formoterol (SYMBICORT) 160-4.5 MCG/ACT inhaler, Inhale 2 puffs into the lungs See  admin instructions. Tues., Thurs, and Saturday before dialysis, Disp: , Rfl:    carvedilol (COREG) 12.5 MG tablet, Take 1 tablet (12.5 mg total) by mouth 2 (two) times daily., Disp: 60 tablet, Rfl: 0   cetirizine (ZYRTEC) 10 MG tablet, Take 10 mg by mouth daily., Disp: , Rfl:    clopidogrel (PLAVIX) 75 MG tablet, Take 75 mg by mouth daily., Disp: , Rfl:    fluticasone (FLONASE) 50 MCG/ACT nasal spray, Place 1 spray into both nostrils daily., Disp: , Rfl:    Insulin Lispro Prot & Lispro (HUMALOG 75/25 MIX) (75-25) 100 UNIT/ML Kwikpen, Inject 10-20 Units into the skin See admin instructions. Take 10 units is the morning and 20 units at bedtime, Disp: , Rfl:    levothyroxine (SYNTHROID) 200 MCG tablet, Take 1 tablet (200 mcg total) by mouth daily at 6 (six) AM., Disp: 30 tablet, Rfl: 0   lidocaine (LIDODERM) 5 %, 2 patches every 12 (twelve) hours. 12 hours off 1 patch Each side of lower back, Disp: , Rfl:    lidocaine-prilocaine (EMLA) cream, SMARTSIG:sparingly Topical 3 Times a Week, Disp: , Rfl:    magnesium oxide (MAG-OX) 400 (241.3 Mg) MG tablet, Take 1 tablet (400 mg total) by mouth 2 (two) times daily., Disp: 30 tablet, Rfl: 0   multivitamin (RENA-VIT) TABS tablet, Take 1 tablet by mouth daily., Disp: , Rfl:    sevelamer carbonate (RENVELA) 800 MG tablet, Take 800 mg by mouth 3 (three) times daily with meals., Disp: , Rfl:    thiamine (VITAMIN B-1) 100 MG tablet, Take 100 mg by mouth daily., Disp: , Rfl:    lubiprostone (AMITIZA) 24 MCG capsule, Take 24 mcg by mouth 2 (two) times daily., Disp: , Rfl:    OVER THE COUNTER MEDICATION, Place 1 drop into both eyes daily as needed (Itching eye). Refresh opt lubricant eye drops, Disp: , Rfl:    pantoprazole (PROTONIX) 40 MG tablet, Take 1 tablet (40 mg total) by mouth 2 (two) times daily before a meal. (Patient taking differently: Take 40 mg by  mouth 2 (two) times daily.), Disp: , Rfl:    polyethylene glycol (MIRALAX / GLYCOLAX) 17 g packet, Take 17 g by mouth daily. (Patient not taking: Reported on 07/30/2022), Disp: 14 each, Rfl: 0  I spent 42 minutes dedicated to the care of this patient on the date of this encounter to include pre-visit review of records, face-to-face time with the patient discussing conditions above, post visit ordering of testing, clinical documentation with the electronic health record, making appropriate referrals as documented, and communicating necessary findings to members of the patients care team.    Garner Nash, DO [facility] 07/30/2022 11:56 AM

## 2022-07-30 NOTE — Progress Notes (Signed)
Synopsis: Referred in March 2023 for lung nodule by Antony Blackbird, MD  Subjective:   PATIENT ID: [name] [name] GENDER: female DOB: 06-29-1951, MRN: [id number]  Chief Complaint  Patient presents with   Follow-up    PET scans review, SOB, sinus drainage with cough. 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  Previously followed left lower lobe pulmonary nodule that was first identified in 2020.  Recent had a hospital admission in February 2023.  Repeat CT scan of the chest at 2.8 x 1.8 cm left lower lobe pulmonary nodule concerning for primary lung cancer.  She does have continued weight loss.  She does not have hemoptysis.  I saw her in the hospital in consultation with recommendations for a PET scan.  Patient had the nuclear medicine PET scan after she was discharged from the hospital.  CT PET scan was completed on 12/29/2021 which revealed a hypermetabolic 3 cm left lower lobe mass with an SUV max of 11.2 concerning for malignancy.  OV 07/30/2022: this is a 71 year old seen for evaluation after PET scan.  She had a PET scan restaging with Dr. Earlie Server.  She was diagnosed with a left lower lobe malignancy in the spring.  She had a repeat PET scan that now shows 2 nodules that are hypermetabolic in the lower lobe concerning for recurrence of malignancy.  Patient was referred back for consideration for repeat biopsy.There is now 2 adjacent intensely hypermetabolic nodules from the left lower lobe position concerning for intralobar metastasis.Initially patient underwent radiation treatments in June with Dr. Lisbeth Renshaw for a stage Ia, T1c, N0, M0 non-small cell lung cancer.  Dr. Earlie Server now requesting repeat bronchoscopy and biopsy of the 2 new nodules as they appear to be recurrence.    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   Full dentures    GERD (gastroesophageal reflux disease)    Gout    History of hiatal hernia    History of transfusion    Hx MRSA infection    abscess left groin   Hyperlipidemia    Hypertension    Hypothyroid    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HCC)    Peripheral vascular disease (HCC)    Sleep apnea    UNABLE TO TOLERATE C PAP   Stress incontinence    TIA (transient ischemic attack)     X2 NO RESIDUAL PROBLEMS   Wears glasses      Family History  Problem Relation Age of Onset   Diabetes Brother    Cardiomyopathy Mother    Heart disease Mother    Cancer Father      Past Surgical History:  Procedure Laterality Date   ABDOMINAL HYSTERECTOMY     AV FISTULA PLACEMENT Left 12/01/2021   Procedure: INSERTION OF LEFT ARM ARTERIOVENOUS (AV) GORE-TEX GRAFT;  Surgeon: Serafina Mitchell, MD;  Location: [facility]  OR;  Service: Vascular;  Laterality: Left;   BACK SURGERY  4268   BASCILIC VEIN TRANSPOSITION Left 04/05/2020   Procedure: BASILIC VEIN TRANSPOSITION FIRST STAGE;  Surgeon: Angelia Mould, MD;  Location: [facility];  Service: Vascular;  Laterality: Left;   BRONCHIAL BIOPSY  01/23/2022   Procedure: BRONCHIAL BIOPSIES;  Surgeon: Garner Nash, DO;  Location: [facility];  Service: Pulmonary;;   BRONCHIAL NEEDLE ASPIRATION BIOPSY  01/23/2022   Procedure: BRONCHIAL NEEDLE ASPIRATION BIOPSIES;  Surgeon: Garner Nash, DO;  Location: [facility];  Service: Pulmonary;;   COLON SURGERY  1995   DUE TO POLYP   DILATION AND CURETTAGE OF UTERUS     ENDOBRONCHIAL ULTRASOUND   01/23/2022   Procedure: ENDOBRONCHIAL ULTRASOUND;  Surgeon: Garner Nash, DO;  Location: [facility];  Service: Pulmonary;;   ENTEROSCOPY N/A 07/10/2021   Procedure: ENTEROSCOPY;  Surgeon: Ronnette Juniper, MD;  Location: [facility];  Service: Gastroenterology;  Laterality: N/A;   ESOPHAGOGASTRODUODENOSCOPY N/A 01/25/2021   Procedure: ESOPHAGOGASTRODUODENOSCOPY (EGD);  Surgeon: Clarene Essex, MD;  Location: [facility];  Service: Endoscopy;  Laterality: N/A;   FINE NEEDLE ASPIRATION  01/23/2022   Procedure: FINE NEEDLE ASPIRATION (FNA) LINEAR;  Surgeon: Garner Nash, DO;  Location: [facility];  Service: Pulmonary;;   FOREARM FRACTURE SURGERY     Left arm   GIVENS CAPSULE STUDY N/A 07/07/2021   Procedure: GIVENS CAPSULE STUDY;  Surgeon: Clarene Essex, MD;  Location: [facility];  Service: Endoscopy;  Laterality: N/A;   HERNIA REPAIR     w/ mesh   HOT HEMOSTASIS N/A 07/10/2021   Procedure: HOT HEMOSTASIS (ARGON PLASMA COAGULATION/BICAP);  Surgeon: Ronnette Juniper, MD;  Location: [facility];  Service: Gastroenterology;  Laterality: N/A;   INCISION AND DRAINAGE ABSCESS Left 02/04/2013   Procedure: INCISION AND DRAINAGE LEFT BUTTOCK ABSCESS; INCISION AND DRAINAGE LEFT BREAST ABSCESS;  Surgeon: Harl Bowie, MD;  Location: [place];  Service: General;  Laterality: Left;   INCISION AND DRAINAGE ABSCESS N/A 02/12/2013   Procedure: INCISION AND DEBRIDEMENT BUTTOCK WOUND ;  Surgeon: [name]. Georgette Dover, MD;  Location: [facility];  Service: General;  Laterality: N/A;   INCISION AND DRAINAGE ABSCESS N/A 02/14/2013   Procedure: INCISION AND DRAINAGE/DRESSING CHANGE;  Surgeon: Harl Bowie, MD;  Location: [place] [facility];  Service: General;  Laterality: N/A;   INCISION AND DRAINAGE ABSCESS N/A 03/21/2015   Procedure: INCISION AND DRAINAGE PUBIC ABSCESS;  Surgeon: Excell Seltzer, MD;  Location: [facility];  Service: General;  Laterality: N/A;   INCISION AND DRAINAGE PERIRECTAL ABSCESS Left 02/10/2013   Procedure: IRRIGATION  AND DEBRIDEMENT OF BUTTOCK/PERINEAL ABSCESS;  Surgeon: [name]. Georgette Dover, MD;  Location: [facility];  Service: General;  Laterality: Left;   INCISION AND DRAINAGE PERIRECTAL ABSCESS N/A 02/16/2013   Procedure: IRRIGATION AND DEBRIDEMENT PERINEAL ABSCESS;  Surgeon: Zenovia Jarred, MD;  Location: [place];  Service: General;  Laterality: N/A;   IR FLUORO GUIDE CV LINE RIGHT  11/30/2021   IR US GUIDE VASC ACCESS RIGHT  11/30/2021   IRRIGATION AND DEBRIDEMENT ABSCESS N/A 02/06/2013   Procedure: IRRIGATION AND DEBRIDEMENT BUTTOCK ABSCESS AND DRESSING CHANGE;  Surgeon: Harl Bowie, MD;  Location: [place];  Service: General;  Laterality: N/A;   IRRIGATION AND DEBRIDEMENT ABSCESS Left 02/08/2013   Procedure: IRRIGATION AND DEBRIDEMENT ABSCESS/DRESSING CHANGE;  Surgeon: Gwenyth Ober, MD;  Location: [facility] [place];  Service: General;  Laterality: Left;   JOINT REPLACEMENT  2010 / 2012    [place]  LEFT HEART CATHETERIZATION WITH CORONARY ANGIOGRAM N/A 07/02/2014   Procedure: LEFT HEART CATHETERIZATION WITH CORONARY ANGIOGRAM;  Surgeon: Lorretta Harp, MD;  Location: [facility];  Service: Cardiovascular;  Laterality: N/A;   LOWER EXTREMITY ANGIOGRAM Right 04/20/2016   Procedure: Lower Extremity Angiogram;  Surgeon: Elam Dutch, MD;  Location: [facility];  Service: Cardiovascular;  Laterality: Right;   MULTIPLE TOOTH EXTRACTIONS     PERIPHERAL VASCULAR CATHETERIZATION N/A 04/20/2016   Procedure: Abdominal Aortogram;  Surgeon: Elam Dutch, MD;  Location: [facility];  Service: Cardiovascular;  Laterality: N/A;   PERIPHERAL VASCULAR CATHETERIZATION Right 04/20/2016   Procedure: Peripheral Vascular Intervention;  Surgeon: Elam Dutch, MD;  Location: [facility];  Service: Cardiovascular;  Laterality: Right;  popiteal   RIGHT HEART CATH N/A 10/27/2018   Procedure: RIGHT HEART CATH;  Surgeon: Larey Dresser, MD;  Location: [facility];  Service: Cardiovascular;   Laterality: N/A;   RIGHT HEART CATH N/A 03/20/2019   Procedure: RIGHT HEART CATH;  Surgeon: Larey Dresser, MD;  Location: [facility];  Service: Cardiovascular;  Laterality: N/A;   THYROIDECTOMY     TOTAL HIP REVISION Right 08/11/2014   Procedure: RIGHT ACETABULAR REVISION;  Surgeon: Gearlean Alf, MD;  Location: [facility];  Service: Orthopedics;  Laterality: Right;   VASCULAR SURGERY     VIDEO BRONCHOSCOPY WITH RADIAL ENDOBRONCHIAL ULTRASOUND  01/23/2022   Procedure: RADIAL ENDOBRONCHIAL ULTRASOUND;  Surgeon: Garner Nash, DO;  Location: [facility];  Service: Pulmonary;;    Social History   Socioeconomic History   Marital status: Legally Separated    Spouse name: Not on file   Number of children: 1   Years of education: college   Highest education level: Not on file  Occupational History    Comment: Disabled  Tobacco Use   Smoking status: Former    Packs/day: 0.25    Years: 40.00    Total pack years: 10.00    Types: Cigarettes    Quit date: 10/2021    Years since quitting: 0.8    Passive exposure: Past   Smokeless tobacco: Former  Scientific laboratory technician Use: Never used  Substance and Sexual Activity   Alcohol use: Yes    Alcohol/week: 1.0 standard drink of alcohol    Types: 1 Standard drinks or equivalent per week    Comment: OCC   Drug use: No   Sexual activity: Not Currently    Birth control/protection: Surgical    Comment: Hysterectomy  Other Topics Concern   Not on file  Social History Narrative   ** Merged History Encounter **       Patient is separated  and lives at home with her friend. Patient is disabled. Education one year of college Right handed Caffeine two cups daily.     Social Determinants of Health   Financial Resource Strain: Not on file  Food Insecurity: Not on file  Transportation Needs: Not on file  Physical Activity: Not on file  Stress: Not on file  Social Connections: Not on file  Intimate Partner Violence: Not At Risk  (03/07/2022)   Humiliation, Afraid, Rape, and Kick questionnaire    Fear of Current or Ex-Partner: No    Emotionally Abused: No    Physically Abused: No    Sexually Abused: No     Allergies  Allergen Reactions   Lisinopril Swelling and Other (See Comments)   Nebivolol Swelling  Chest pain (reaction to Bystolic)   Ace Inhibitors Swelling and Other (See Comments)    Tongue swell   Morphine And Related Itching     Outpatient Medications Prior to Visit  Medication Sig Dispense Refill   acetaminophen (TYLENOL) 500 MG tablet Take 1,000 mg by mouth 3 (three) times daily.     albuterol (PROVENTIL) (2.5 MG/3ML) 0.083% nebulizer solution Take 3 mLs (2.5 mg total) by nebulization every 6 (six) hours as needed for wheezing or shortness of breath. 75 mL 12   albuterol (VENTOLIN HFA) 108 (90 Base) MCG/ACT inhaler Inhale 2 puffs into the lungs every 6 (six) hours as needed for shortness of breath (COPD).     allopurinol (ZYLOPRIM) 100 MG tablet Take 1 tablet (100 mg total) by mouth daily. 30 tablet 0   budesonide-formoterol (SYMBICORT) 160-4.5 MCG/ACT inhaler Inhale 2 puffs into the lungs See admin instructions. Tues., Thurs, and Saturday before dialysis     carvedilol (COREG) 12.5 MG tablet Take 1 tablet (12.5 mg total) by mouth 2 (two) times daily. 60 tablet 0   cetirizine (ZYRTEC) 10 MG tablet Take 10 mg by mouth daily.     clopidogrel (PLAVIX) 75 MG tablet Take 75 mg by mouth daily.     fluticasone (FLONASE) 50 MCG/ACT nasal spray Place 1 spray into both nostrils daily.     Insulin Lispro Prot & Lispro (HUMALOG 75/25 MIX) (75-25) 100 UNIT/ML Kwikpen Inject 10-20 Units into the skin See admin instructions. Take 10 units is the morning and 20 units at bedtime     levothyroxine (SYNTHROID) 200 MCG tablet Take 1 tablet (200 mcg total) by mouth daily at 6 (six) AM. 30 tablet 0   lidocaine (LIDODERM) 5 % 2 patches every 12 (twelve) hours. 12 hours off 1 patch Each side of lower back      lidocaine-prilocaine (EMLA) cream SMARTSIG:sparingly Topical 3 Times a Week     magnesium oxide (MAG-OX) 400 (241.3 Mg) MG tablet Take 1 tablet (400 mg total) by mouth 2 (two) times daily. 30 tablet 0   multivitamin (RENA-VIT) TABS tablet Take 1 tablet by mouth daily.     sevelamer carbonate (RENVELA) 800 MG tablet Take 800 mg by mouth 3 (three) times daily with meals.     thiamine (VITAMIN B-1) 100 MG tablet Take 100 mg by mouth daily.     lubiprostone (AMITIZA) 24 MCG capsule Take 24 mcg by mouth 2 (two) times daily.     OVER THE COUNTER MEDICATION Place 1 drop into both eyes daily as needed (Itching eye). Refresh opt lubricant eye drops     pantoprazole (PROTONIX) 40 MG tablet Take 1 tablet (40 mg total) by mouth 2 (two) times daily before a meal. (Patient taking differently: Take 40 mg by mouth 2 (two) times daily.)     polyethylene glycol (MIRALAX / GLYCOLAX) 17 g packet Take 17 g by mouth daily. (Patient not taking: Reported on 07/30/2022) 14 each 0   No facility-administered medications prior to visit.    Review of Systems  Constitutional:  Negative for chills, fever, malaise/fatigue and weight loss.  HENT:  Negative for hearing loss, sore throat and tinnitus.   Eyes:  Negative for blurred vision and double vision.  Respiratory:  Positive for cough, hemoptysis and shortness of breath. Negative for sputum production, wheezing and stridor.   Cardiovascular:  Negative for chest pain, palpitations, orthopnea, leg swelling and PND.  Gastrointestinal:  Negative for abdominal pain, constipation, diarrhea, heartburn, nausea and vomiting.  Genitourinary:  Negative for dysuria, hematuria and urgency.  Musculoskeletal:  Negative for joint pain and myalgias.  Skin:  Negative for itching and rash.  Neurological:  Negative for dizziness, tingling, weakness and headaches.  Endo/Heme/Allergies:  Negative for environmental allergies. Does not bruise/bleed easily.  Psychiatric/Behavioral:  Negative for  depression. The patient is not nervous/anxious and does not have insomnia.   All other systems reviewed and are negative.    Objective:  Physical Exam Vitals reviewed.  Constitutional:      General: She is not in acute distress.    Appearance: She is well-developed. She is obese.  HENT:     Head: Normocephalic and atraumatic.  Eyes:     General: No scleral icterus.    Conjunctiva/sclera: Conjunctivae normal.     Pupils: Pupils are equal, round, and reactive to light.  Neck:     Vascular: No JVD.     Trachea: No tracheal deviation.  Cardiovascular:     Rate and Rhythm: Normal rate and regular rhythm.     Heart sounds: Normal heart sounds. No murmur heard. Pulmonary:     Effort: Pulmonary effort is normal. No tachypnea, accessory muscle usage or respiratory distress.     Breath sounds: No stridor. No wheezing, rhonchi or rales.  Abdominal:     General: There is no distension.     Palpations: Abdomen is soft.     Tenderness: There is no abdominal tenderness.  Musculoskeletal:        General: No tenderness.     Cervical back: Neck supple.  Lymphadenopathy:     Cervical: No cervical adenopathy.  Skin:    General: Skin is warm and dry.     Capillary Refill: Capillary refill takes less than 2 seconds.     Findings: No rash.  Neurological:     Mental Status: She is alert and oriented to person, place, and time.  Psychiatric:        Behavior: Behavior normal.      Vitals:   07/30/22 1102  BP: (!) 140/66  Pulse: 77  Temp: 98.5 F (36.9 C)  TempSrc: Oral  SpO2: 91%  Weight: 204 lb (92.5 kg)  Height: 5\' 3"  (1.6 m)   91% on RA BMI Readings from Last 3 Encounters:  07/30/22 36.14 kg/m  07/18/22 40.03 kg/m  07/11/22 38.97 kg/m   Wt Readings from Last 3 Encounters:  07/30/22 204 lb (92.5 kg)  07/18/22 226 lb (102.5 kg)  07/11/22 220 lb (99.8 kg)     CBC    Component Value Date/Time   WBC 9.5 06/25/2022 0955   WBC 13.1 (H) 12/07/2021 0630   RBC 3.51 (L)  06/25/2022 0955   HGB 10.3 (L) 06/25/2022 0955   HCT 34.8 (L) 06/25/2022 0955   HCT 24.6 (L) 10/17/2020 1235   PLT 227 06/25/2022 0955   MCV 99.1 06/25/2022 0955   MCH 29.3 06/25/2022 0955   MCHC 29.6 (L) 06/25/2022 0955   RDW 17.0 (H) 06/25/2022 0955   RDW 15.7 (H) 12/23/2013 1132   LYMPHSABS 1.0 06/25/2022 0955   LYMPHSABS 2.5 12/23/2013 1132   MONOABS 0.8 06/25/2022 0955   EOSABS 0.1 06/25/2022 0955   EOSABS 0.1 12/23/2013 1132   BASOSABS 0.0 06/25/2022 0955   BASOSABS 0.0 12/23/2013 1132      Chest Imaging: Nuclear medicine pet imaging March 1601: Hypermetabolic left lower lobe lung mass concerning for primary malignancy. The patient's images have been independently reviewed by me.    October 2023 nuclear medicine pet  imaging: 2 hypermetabolic nodules within the left lower concern lobe concerning for recurrence of malignancy. The patient's images have been independently reviewed by me.    Pulmonary Functions Testing Results:    Latest Ref Rng & Units 05/08/2022   11:50 AM  PFT Results  FVC-Pre L 1.52   FVC-Predicted Pre % 53   FVC-Post L 1.48   FVC-Predicted Post % 52   Pre FEV1/FVC % % 77   Post FEV1/FCV % % 80   FEV1-Pre L 1.17   FEV1-Predicted Pre % 54   FEV1-Post L 1.19   DLCO uncorrected ml/min/mmHg 9.41   DLCO UNC% % 50   DLCO corrected ml/min/mmHg 9.41   DLCO COR %Predicted % 50   DLVA Predicted % 65   TLC L 3.74   TLC % Predicted % 76   RV % Predicted % 97     FeNO:   Pathology:   Echocardiogram:   Heart Catheterization:     Assessment & Plan:     ICD-10-CM   1. Lung mass  R91.8 Procedural/ Surgical Case Request: ROBOTIC ASSISTED NAVIGATIONAL BRONCHOSCOPY, VIDEO BRONCHOSCOPY WITH ENDOBRONCHIAL ULTRASOUND    Ambulatory referral to Pulmonology    CT Super D Chest Wo Contrast    Novel Coronavirus, NAA ([facility])    2. Lung nodules  R91.8     3. H/O: lung cancer  Z85.118       Discussion:  This is a 71 year old obese female, former  smoker, prior one quarter of a pack a day for 40 years.  Initially had a incidental pulmonary nodule found in 2020.  No subsequent follow-up completed at that time.  She had a repeat CT chest in February 2023 which was found during her hospitalization that showed enlargement of left lower lobe nodule.  PET scan imaging showed hypermetabolic uptake with an SUV of 11.  Patient was taken for robotic assisted navigational bronchoscopy and tissue sampling which confirmed non-small cell lung cancer left lower lobe status post radiation treatments with Dr. Lisbeth Renshaw at this time.  Repeat pet imaging reveals 2 new nodules in the left lung concerning for recurrence of disease and patient was referred back for repeat bronchoscopy.  Plan: Today in the office we talked about the risk benefits alternatives of proceeding with robotic assisted bronchoscopy and tissue biopsy to include bleeding and pneumothorax. Patient is agreeable to proceed with this plan. Tentative bronchoscopy date would be 08/07/2022. Patient is on Plavix. Patient's last dose Plavix will be tomorrow. She will need to rearrange her dialysis schedule. She will get dialysis on Monday the 30th. She will call her dialysis unit and get this arranged. She will need an appointment to see SG, NP 1 week after bronchoscopy complete.  We have PCC's help with scheduling.   Current Outpatient Medications:    acetaminophen (TYLENOL) 500 MG tablet, Take 1,000 mg by mouth 3 (three) times daily., Disp: , Rfl:    albuterol (PROVENTIL) (2.5 MG/3ML) 0.083% nebulizer solution, Take 3 mLs (2.5 mg total) by nebulization every 6 (six) hours as needed for wheezing or shortness of breath., Disp: 75 mL, Rfl: 12   albuterol (VENTOLIN HFA) 108 (90 Base) MCG/ACT inhaler, Inhale 2 puffs into the lungs every 6 (six) hours as needed for shortness of breath (COPD)., Disp: , Rfl:    allopurinol (ZYLOPRIM) 100 MG tablet, Take 1 tablet (100 mg total) by mouth daily., Disp: 30  tablet, Rfl: 0   budesonide-formoterol (SYMBICORT) 160-4.5 MCG/ACT inhaler, Inhale 2 puffs into the lungs See  admin instructions. Tues., Thurs, and Saturday before dialysis, Disp: , Rfl:    carvedilol (COREG) 12.5 MG tablet, Take 1 tablet (12.5 mg total) by mouth 2 (two) times daily., Disp: 60 tablet, Rfl: 0   cetirizine (ZYRTEC) 10 MG tablet, Take 10 mg by mouth daily., Disp: , Rfl:    clopidogrel (PLAVIX) 75 MG tablet, Take 75 mg by mouth daily., Disp: , Rfl:    fluticasone (FLONASE) 50 MCG/ACT nasal spray, Place 1 spray into both nostrils daily., Disp: , Rfl:    Insulin Lispro Prot & Lispro (HUMALOG 75/25 MIX) (75-25) 100 UNIT/ML Kwikpen, Inject 10-20 Units into the skin See admin instructions. Take 10 units is the morning and 20 units at bedtime, Disp: , Rfl:    levothyroxine (SYNTHROID) 200 MCG tablet, Take 1 tablet (200 mcg total) by mouth daily at 6 (six) AM., Disp: 30 tablet, Rfl: 0   lidocaine (LIDODERM) 5 %, 2 patches every 12 (twelve) hours. 12 hours off 1 patch Each side of lower back, Disp: , Rfl:    lidocaine-prilocaine (EMLA) cream, SMARTSIG:sparingly Topical 3 Times a Week, Disp: , Rfl:    magnesium oxide (MAG-OX) 400 (241.3 Mg) MG tablet, Take 1 tablet (400 mg total) by mouth 2 (two) times daily., Disp: 30 tablet, Rfl: 0   multivitamin (RENA-VIT) TABS tablet, Take 1 tablet by mouth daily., Disp: , Rfl:    sevelamer carbonate (RENVELA) 800 MG tablet, Take 800 mg by mouth 3 (three) times daily with meals., Disp: , Rfl:    thiamine (VITAMIN B-1) 100 MG tablet, Take 100 mg by mouth daily., Disp: , Rfl:    lubiprostone (AMITIZA) 24 MCG capsule, Take 24 mcg by mouth 2 (two) times daily., Disp: , Rfl:    OVER THE COUNTER MEDICATION, Place 1 drop into both eyes daily as needed (Itching eye). Refresh opt lubricant eye drops, Disp: , Rfl:    pantoprazole (PROTONIX) 40 MG tablet, Take 1 tablet (40 mg total) by mouth 2 (two) times daily before a meal. (Patient taking differently: Take 40 mg by  mouth 2 (two) times daily.), Disp: , Rfl:    polyethylene glycol (MIRALAX / GLYCOLAX) 17 g packet, Take 17 g by mouth daily. (Patient not taking: Reported on 07/30/2022), Disp: 14 each, Rfl: 0  I spent 42 minutes dedicated to the care of this patient on the date of this encounter to include pre-visit review of records, face-to-face time with the patient discussing conditions above, post visit ordering of testing, clinical documentation with the electronic health record, making appropriate referrals as documented, and communicating necessary findings to members of the patients care team.    Garner Nash, DO [facility] 07/30/2022 11:56 AM

## 2022-08-01 ENCOUNTER — Telehealth: Payer: Self-pay | Admitting: Pulmonary Disease

## 2022-08-01 NOTE — Telephone Encounter (Signed)
Called Pt to let her know that Dr Valeta Harms said for her to get her Covid test on Friday August 03, 2022 before her Bronchoscopy on October 31,2023. Pt stated she would try to get a ride for the Covid test. Nothing further needed.

## 2022-08-01 NOTE — Telephone Encounter (Signed)
Pt called to let you know that she was not able to get tested for Covid for her CT Scan scheduled on 08/10/22. She would like some advice on what to do. Please advise. Thank you.

## 2022-08-02 NOTE — Telephone Encounter (Signed)
Patient went ahead and cancelled ct for tomorrow. I rescheduled her for 10/29 that was her only option for it to be prior to bronch procedure. I attempted to reach her at [phone]  to make her aware, phone just kept ringing.   Called her husband at [phone] and left a vm for her to return my call.

## 2022-08-03 ENCOUNTER — Ambulatory Visit (HOSPITAL_COMMUNITY): Payer: 59

## 2022-08-06 ENCOUNTER — Encounter (HOSPITAL_COMMUNITY): Payer: Self-pay | Admitting: Pulmonary Disease

## 2022-08-06 NOTE — Progress Notes (Signed)
PCP - Dr Oneida Alar Cardiologist - none, seen Dr Quay Burow in past, last OV 08/04/19.  Hx Video Visit 2021. Oncology - Dr Curt Bears Pulmonology - June Leap, DO  Chest x-ray - 06/25/22 EKG - 11/28/21 Stress Test - 06/29/14 ECHO - 11/22/21 Cardiac Cath - 03/20/19  ICD Pacemaker/Loop - n/a  Sleep Study -  Yes CPAP - dos not use cpap  THE NIGHT BEFORE SURGERY, take 7 units Humalog insulin.     THE MORNING OF SURGERY, take 10 units Humalog insulin.  If your blood sugar is less than 70 mg/dL, you will need to treat for low blood sugar: Treat a low blood sugar (less than 70 mg/dL) with  cup of clear juice (cranberry or apple), 4 glucose tablets, OR glucose gel. Recheck blood sugar in 15 minutes after treatment (to make sure it is greater than 70 mg/dL). If your blood sugar is not greater than 70 mg/dL on recheck, call [phone] for further instructions.  Blood Thinner Instructions:  Last dose of Plavix was on 07/31/22.    Anesthesia review: Yes, Recent surgery 01/2022. To be evaluated on DOS.   STOP now taking any Aspirin (unless otherwise instructed by your surgeon), Aleve, Naproxen, Ibuprofen, Motrin, Advil, Goody's, BC's, all herbal medications, fish oil, and all vitamins.   Coronavirus Screening COVID test on DOS Do you have any of the following symptoms:  Cough yes/no: Yes Fever (>100.45F)  yes/no: No Runny nose yes/no: No Sore throat yes/no: No Difficulty breathing/shortness of breath  SOB  Have you traveled in the last 14 days and where? yes/no: No  Patient verbalized understanding of instructions that were given via phone.

## 2022-08-07 ENCOUNTER — Ambulatory Visit (HOSPITAL_COMMUNITY): Payer: 59 | Admitting: Anesthesiology

## 2022-08-07 ENCOUNTER — Ambulatory Visit (HOSPITAL_COMMUNITY): Payer: 59

## 2022-08-07 ENCOUNTER — Other Ambulatory Visit: Payer: Self-pay

## 2022-08-07 ENCOUNTER — Inpatient Hospital Stay (HOSPITAL_COMMUNITY)
Admission: RE | Admit: 2022-08-07 | Discharge: 2022-08-08 | DRG: 166 | Disposition: A | Discharge status: Home / Self Care | Payer: 59 | Attending: Emergency Medicine | Admitting: Emergency Medicine

## 2022-08-07 ENCOUNTER — Encounter (HOSPITAL_COMMUNITY): Payer: Self-pay | Admitting: Pulmonary Disease

## 2022-08-07 ENCOUNTER — Encounter (HOSPITAL_COMMUNITY)
Admission: RE | Disposition: A | Discharge status: Home / Self Care | Payer: Self-pay | Source: Home / Self Care | Attending: Emergency Medicine

## 2022-08-07 DIAGNOSIS — R918 Other nonspecific abnormal finding of lung field: Secondary | ICD-10-CM | POA: Diagnosis present

## 2022-08-07 DIAGNOSIS — Z9889 Other specified postprocedural states: Secondary | ICD-10-CM

## 2022-08-07 DIAGNOSIS — Z809 Family history of malignant neoplasm, unspecified: Secondary | ICD-10-CM

## 2022-08-07 DIAGNOSIS — J9601 Acute respiratory failure with hypoxia: Secondary | ICD-10-CM | POA: Diagnosis present

## 2022-08-07 DIAGNOSIS — Z8673 Personal history of transient ischemic attack (TIA), and cerebral infarction without residual deficits: Secondary | ICD-10-CM

## 2022-08-07 DIAGNOSIS — D631 Anemia in chronic kidney disease: Secondary | ICD-10-CM | POA: Diagnosis present

## 2022-08-07 DIAGNOSIS — C3432 Malignant neoplasm of lower lobe, left bronchus or lung: Secondary | ICD-10-CM | POA: Diagnosis present

## 2022-08-07 DIAGNOSIS — R34 Anuria and oliguria: Secondary | ICD-10-CM | POA: Diagnosis present

## 2022-08-07 DIAGNOSIS — Y838 Other surgical procedures as the cause of abnormal reaction of the patient, or of later complication, without mention of misadventure at the time of the procedure: Secondary | ICD-10-CM | POA: Diagnosis present

## 2022-08-07 DIAGNOSIS — I509 Heart failure, unspecified: Secondary | ICD-10-CM

## 2022-08-07 DIAGNOSIS — E6609 Other obesity due to excess calories: Secondary | ICD-10-CM

## 2022-08-07 DIAGNOSIS — J4489 Other specified chronic obstructive pulmonary disease: Secondary | ICD-10-CM | POA: Diagnosis present

## 2022-08-07 DIAGNOSIS — N186 End stage renal disease: Secondary | ICD-10-CM

## 2022-08-07 DIAGNOSIS — I5032 Chronic diastolic (congestive) heart failure: Secondary | ICD-10-CM | POA: Diagnosis present

## 2022-08-07 DIAGNOSIS — E039 Hypothyroidism, unspecified: Secondary | ICD-10-CM | POA: Diagnosis present

## 2022-08-07 DIAGNOSIS — I132 Hypertensive heart and chronic kidney disease with heart failure and with stage 5 chronic kidney disease, or end stage renal disease: Secondary | ICD-10-CM | POA: Diagnosis present

## 2022-08-07 DIAGNOSIS — J95821 Acute postprocedural respiratory failure: Secondary | ICD-10-CM | POA: Diagnosis present

## 2022-08-07 DIAGNOSIS — E1122 Type 2 diabetes mellitus with diabetic chronic kidney disease: Secondary | ICD-10-CM | POA: Diagnosis present

## 2022-08-07 DIAGNOSIS — J9600 Acute respiratory failure, unspecified whether with hypoxia or hypercapnia: Secondary | ICD-10-CM | POA: Diagnosis present

## 2022-08-07 DIAGNOSIS — Z794 Long term (current) use of insulin: Secondary | ICD-10-CM | POA: Diagnosis not present

## 2022-08-07 DIAGNOSIS — Z992 Dependence on renal dialysis: Secondary | ICD-10-CM

## 2022-08-07 DIAGNOSIS — J9621 Acute and chronic respiratory failure with hypoxia: Secondary | ICD-10-CM | POA: Diagnosis not present

## 2022-08-07 DIAGNOSIS — R4781 Slurred speech: Secondary | ICD-10-CM | POA: Diagnosis present

## 2022-08-07 DIAGNOSIS — T8859XA Other complications of anesthesia, initial encounter: Secondary | ICD-10-CM | POA: Diagnosis present

## 2022-08-07 DIAGNOSIS — G894 Chronic pain syndrome: Secondary | ICD-10-CM | POA: Diagnosis present

## 2022-08-07 DIAGNOSIS — I5042 Chronic combined systolic (congestive) and diastolic (congestive) heart failure: Secondary | ICD-10-CM | POA: Diagnosis present

## 2022-08-07 DIAGNOSIS — I10 Essential (primary) hypertension: Secondary | ICD-10-CM | POA: Diagnosis present

## 2022-08-07 DIAGNOSIS — R911 Solitary pulmonary nodule: Secondary | ICD-10-CM | POA: Diagnosis not present

## 2022-08-07 DIAGNOSIS — Z888 Allergy status to other drugs, medicaments and biological substances status: Secondary | ICD-10-CM

## 2022-08-07 DIAGNOSIS — Z8249 Family history of ischemic heart disease and other diseases of the circulatory system: Secondary | ICD-10-CM | POA: Diagnosis not present

## 2022-08-07 DIAGNOSIS — I5043 Acute on chronic combined systolic (congestive) and diastolic (congestive) heart failure: Secondary | ICD-10-CM

## 2022-08-07 DIAGNOSIS — Z9049 Acquired absence of other specified parts of digestive tract: Secondary | ICD-10-CM

## 2022-08-07 DIAGNOSIS — Z87891 Personal history of nicotine dependence: Secondary | ICD-10-CM | POA: Diagnosis not present

## 2022-08-07 DIAGNOSIS — E785 Hyperlipidemia, unspecified: Secondary | ICD-10-CM | POA: Diagnosis present

## 2022-08-07 DIAGNOSIS — J9622 Acute and chronic respiratory failure with hypercapnia: Secondary | ICD-10-CM | POA: Diagnosis not present

## 2022-08-07 DIAGNOSIS — E1151 Type 2 diabetes mellitus with diabetic peripheral angiopathy without gangrene: Secondary | ICD-10-CM | POA: Diagnosis present

## 2022-08-07 DIAGNOSIS — G9341 Metabolic encephalopathy: Secondary | ICD-10-CM | POA: Diagnosis present

## 2022-08-07 DIAGNOSIS — J9811 Atelectasis: Secondary | ICD-10-CM | POA: Diagnosis present

## 2022-08-07 DIAGNOSIS — G4733 Obstructive sleep apnea (adult) (pediatric): Secondary | ICD-10-CM | POA: Diagnosis present

## 2022-08-07 DIAGNOSIS — Z90710 Acquired absence of both cervix and uterus: Secondary | ICD-10-CM

## 2022-08-07 DIAGNOSIS — J9690 Respiratory failure, unspecified, unspecified whether with hypoxia or hypercapnia: Secondary | ICD-10-CM | POA: Insufficient documentation

## 2022-08-07 DIAGNOSIS — Z885 Allergy status to narcotic agent status: Secondary | ICD-10-CM

## 2022-08-07 DIAGNOSIS — N281 Cyst of kidney, acquired: Secondary | ICD-10-CM | POA: Diagnosis present

## 2022-08-07 DIAGNOSIS — Z923 Personal history of irradiation: Secondary | ICD-10-CM

## 2022-08-07 DIAGNOSIS — K219 Gastro-esophageal reflux disease without esophagitis: Secondary | ICD-10-CM | POA: Diagnosis present

## 2022-08-07 DIAGNOSIS — D649 Anemia, unspecified: Secondary | ICD-10-CM | POA: Diagnosis present

## 2022-08-07 DIAGNOSIS — Z6836 Body mass index (BMI) 36.0-36.9, adult: Secondary | ICD-10-CM | POA: Diagnosis not present

## 2022-08-07 DIAGNOSIS — Z833 Family history of diabetes mellitus: Secondary | ICD-10-CM

## 2022-08-07 DIAGNOSIS — E890 Postprocedural hypothyroidism: Secondary | ICD-10-CM | POA: Diagnosis present

## 2022-08-07 DIAGNOSIS — Z8614 Personal history of Methicillin resistant Staphylococcus aureus infection: Secondary | ICD-10-CM

## 2022-08-07 DIAGNOSIS — I120 Hypertensive chronic kidney disease with stage 5 chronic kidney disease or end stage renal disease: Secondary | ICD-10-CM

## 2022-08-07 DIAGNOSIS — E1129 Type 2 diabetes mellitus with other diabetic kidney complication: Secondary | ICD-10-CM | POA: Diagnosis present

## 2022-08-07 DIAGNOSIS — Z7902 Long term (current) use of antithrombotics/antiplatelets: Secondary | ICD-10-CM

## 2022-08-07 DIAGNOSIS — Z1152 Encounter for screening for COVID-19: Secondary | ICD-10-CM

## 2022-08-07 DIAGNOSIS — Z79899 Other long term (current) drug therapy: Secondary | ICD-10-CM

## 2022-08-07 DIAGNOSIS — J811 Chronic pulmonary edema: Secondary | ICD-10-CM | POA: Insufficient documentation

## 2022-08-07 DIAGNOSIS — Z720 Tobacco use: Secondary | ICD-10-CM | POA: Diagnosis present

## 2022-08-07 HISTORY — PX: BRONCHIAL BRUSHINGS: SHX5108

## 2022-08-07 HISTORY — PX: BRONCHIAL NEEDLE ASPIRATION BIOPSY: SHX5106

## 2022-08-07 HISTORY — PX: BRONCHIAL BIOPSY: SHX5109

## 2022-08-07 HISTORY — PX: VIDEO BRONCHOSCOPY WITH ENDOBRONCHIAL ULTRASOUND: SHX6177

## 2022-08-07 LAB — CBC WITH DIFFERENTIAL/PLATELET
Abs Immature Granulocytes: 0.06 10*3/uL (ref 0.00–0.07)
Basophils Absolute: 0 10*3/uL (ref 0.0–0.1)
Basophils Relative: 0 %
Eosinophils Absolute: 0 10*3/uL (ref 0.0–0.5)
Eosinophils Relative: 0 %
HCT: 36.8 % (ref 36.0–46.0)
Hemoglobin: 10.5 g/dL — ABNORMAL LOW (ref 12.0–15.0)
Immature Granulocytes: 1 %
Lymphocytes Relative: 9 %
Lymphs Abs: 1 10*3/uL (ref 0.7–4.0)
MCH: 29.4 pg (ref 26.0–34.0)
MCHC: 28.5 g/dL — ABNORMAL LOW (ref 30.0–36.0)
MCV: 103.1 fL — ABNORMAL HIGH (ref 80.0–100.0)
Monocytes Absolute: 0.3 10*3/uL (ref 0.1–1.0)
Monocytes Relative: 3 %
Neutro Abs: 10.2 10*3/uL — ABNORMAL HIGH (ref 1.7–7.7)
Neutrophils Relative %: 87 %
Platelets: 213 10*3/uL (ref 150–400)
RBC: 3.57 MIL/uL — ABNORMAL LOW (ref 3.87–5.11)
RDW: 16 % — ABNORMAL HIGH (ref 11.5–15.5)
WBC: 11.7 10*3/uL — ABNORMAL HIGH (ref 4.0–10.5)
nRBC: 0.2 % (ref 0.0–0.2)

## 2022-08-07 LAB — COMPREHENSIVE METABOLIC PANEL
ALT: 22 U/L (ref 0–44)
AST: 31 U/L (ref 15–41)
Albumin: 3.7 g/dL (ref 3.5–5.0)
Alkaline Phosphatase: 146 U/L — ABNORMAL HIGH (ref 38–126)
Anion gap: 19 — ABNORMAL HIGH (ref 5–15)
BUN: 41 mg/dL — ABNORMAL HIGH (ref 8–23)
CO2: 30 mmol/L (ref 22–32)
Calcium: 9.9 mg/dL (ref 8.9–10.3)
Chloride: 95 mmol/L — ABNORMAL LOW (ref 98–111)
Creatinine, Ser: 4.69 mg/dL — ABNORMAL HIGH (ref 0.44–1.00)
GFR, Estimated: 9 mL/min — ABNORMAL LOW (ref >60)
Glucose, Bld: 163 mg/dL — ABNORMAL HIGH (ref 70–99)
Potassium: 5.1 mmol/L (ref 3.5–5.1)
Sodium: 144 mmol/L (ref 135–145)
Total Bilirubin: 0.9 mg/dL (ref 0.3–1.2)
Total Protein: 7 g/dL (ref 6.5–8.1)

## 2022-08-07 LAB — POCT I-STAT 7, (LYTES, BLD GAS, ICA,H+H)
Acid-Base Excess: 5 mmol/L — ABNORMAL HIGH (ref 0.0–2.0)
Bicarbonate: 34.1 mmol/L — ABNORMAL HIGH (ref 20.0–28.0)
Calcium, Ion: 1.22 mmol/L (ref 1.15–1.40)
HCT: 32 % — ABNORMAL LOW (ref 36.0–46.0)
Hemoglobin: 10.9 g/dL — ABNORMAL LOW (ref 12.0–15.0)
O2 Saturation: 84 %
Patient temperature: 97.6
Potassium: 4.9 mmol/L (ref 3.5–5.1)
Sodium: 138 mmol/L (ref 135–145)
TCO2: 36 mmol/L — ABNORMAL HIGH (ref 22–32)
pCO2 arterial: 72.3 mmHg (ref 32–48)
pH, Arterial: 7.278 — ABNORMAL LOW (ref 7.35–7.45)
pO2, Arterial: 55 mmHg — ABNORMAL LOW (ref 83–108)

## 2022-08-07 LAB — POCT I-STAT, CHEM 8
BUN: 40 mg/dL — ABNORMAL HIGH (ref 8–23)
Calcium, Ion: 1.25 mmol/L (ref 1.15–1.40)
Chloride: 97 mmol/L — ABNORMAL LOW (ref 98–111)
Creatinine, Ser: 4.4 mg/dL — ABNORMAL HIGH (ref 0.44–1.00)
Glucose, Bld: 123 mg/dL — ABNORMAL HIGH (ref 70–99)
HCT: 36 % (ref 36.0–46.0)
Hemoglobin: 12.2 g/dL (ref 12.0–15.0)
Potassium: 4.6 mmol/L (ref 3.5–5.1)
Sodium: 141 mmol/L (ref 135–145)
TCO2: 36 mmol/L — ABNORMAL HIGH (ref 22–32)

## 2022-08-07 LAB — BLOOD GAS, ARTERIAL
Acid-Base Excess: 7.7 mmol/L — ABNORMAL HIGH (ref 0.0–2.0)
Bicarbonate: 38.9 mmol/L — ABNORMAL HIGH (ref 20.0–28.0)
Drawn by: 39899
O2 Saturation: 95 %
Patient temperature: 36.1
pCO2 arterial: 89 mmHg (ref 32–48)
pH, Arterial: 7.24 — ABNORMAL LOW (ref 7.35–7.45)
pO2, Arterial: 85 mmHg (ref 83–108)

## 2022-08-07 LAB — PROCALCITONIN: Procalcitonin: 0.33 ng/mL

## 2022-08-07 LAB — MRSA NEXT GEN BY PCR, NASAL: MRSA by PCR Next Gen: NOT DETECTED

## 2022-08-07 LAB — BRAIN NATRIURETIC PEPTIDE: B Natriuretic Peptide: 699 pg/mL — ABNORMAL HIGH (ref 0.0–100.0)

## 2022-08-07 LAB — LACTIC ACID, PLASMA
Lactic Acid, Venous: 1.1 mmol/L (ref 0.5–1.9)
Lactic Acid, Venous: 1.1 mmol/L (ref 0.5–1.9)

## 2022-08-07 LAB — SARS CORONAVIRUS 2 BY RT PCR: SARS Coronavirus 2 by RT PCR: NEGATIVE

## 2022-08-07 LAB — POTASSIUM: Potassium: 4.9 mmol/L (ref 3.5–5.1)

## 2022-08-07 LAB — GLUCOSE, CAPILLARY
Glucose-Capillary: 138 mg/dL — ABNORMAL HIGH (ref 70–99)
Glucose-Capillary: 104 mg/dL — ABNORMAL HIGH (ref 70–99)
Glucose-Capillary: 102 mg/dL — ABNORMAL HIGH (ref 70–99)
Glucose-Capillary: 121 mg/dL — ABNORMAL HIGH (ref 70–99)
Glucose-Capillary: 144 mg/dL — ABNORMAL HIGH (ref 70–99)
Glucose-Capillary: 186 mg/dL — ABNORMAL HIGH (ref 70–99)

## 2022-08-07 LAB — HEPATITIS B SURFACE ANTIGEN: Hepatitis B Surface Ag: NONREACTIVE

## 2022-08-07 SURGERY — BRONCHOSCOPY, WITH BIOPSY USING ELECTROMAGNETIC NAVIGATION
Anesthesia: General | Laterality: Left

## 2022-08-07 MED ORDER — SEVELAMER CARBONATE 800 MG PO TABS
1600.0000 mg | ORAL_TABLET | Freq: Three times a day (TID) | ORAL | Status: DC
  Administered 2022-08-08 (×2): 1600 mg via ORAL
  Filled 2022-08-07 (×2): qty 2

## 2022-08-07 MED ORDER — BUDESONIDE 0.5 MG/2ML IN SUSP
0.5000 mg | Freq: Two times a day (BID) | RESPIRATORY_TRACT | Status: DC
  Administered 2022-08-08: 0.5 mg via RESPIRATORY_TRACT
  Filled 2022-08-07: qty 2

## 2022-08-07 MED ORDER — ALBUTEROL SULFATE (2.5 MG/3ML) 0.083% IN NEBU
INHALATION_SOLUTION | RESPIRATORY_TRACT | Status: AC
  Administered 2022-08-07: 2.5 mg via RESPIRATORY_TRACT
  Filled 2022-08-07: qty 3

## 2022-08-07 MED ORDER — PROPOFOL 10 MG/ML IV BOLUS
INTRAVENOUS | Status: DC | PRN
  Administered 2022-08-07: 50 mg via INTRAVENOUS

## 2022-08-07 MED ORDER — FENTANYL CITRATE (PF) 100 MCG/2ML IJ SOLN
INTRAMUSCULAR | Status: DC | PRN
  Administered 2022-08-07 (×2): 50 ug via INTRAVENOUS

## 2022-08-07 MED ORDER — CHLORHEXIDINE GLUCONATE 0.12 % MT SOLN
15.0000 mL | Freq: Once | OROMUCOSAL | Status: AC
  Filled 2022-08-07: qty 15

## 2022-08-07 MED ORDER — CHLORHEXIDINE GLUCONATE CLOTH 2 % EX PADS: 6.0000 | MEDICATED_PAD | Freq: Every day | CUTANEOUS | Status: DC

## 2022-08-07 MED ORDER — CLOPIDOGREL BISULFATE 75 MG PO TABS
75.0000 mg | ORAL_TABLET | Freq: Every day | ORAL | Status: DC
  Administered 2022-08-08: 75 mg via ORAL
  Filled 2022-08-07: qty 1

## 2022-08-07 MED ORDER — INSULIN ASPART 100 UNIT/ML IJ SOLN: 0.0000 [IU] | Freq: Every day | INTRAMUSCULAR | Status: DC

## 2022-08-07 MED ORDER — ALBUTEROL SULFATE (2.5 MG/3ML) 0.083% IN NEBU
2.5000 mg | INHALATION_SOLUTION | Freq: Once | RESPIRATORY_TRACT | Status: AC
  Administered 2022-08-07: 2.5 mg via RESPIRATORY_TRACT

## 2022-08-07 MED ORDER — DOCUSATE SODIUM 100 MG PO CAPS: 100.0000 mg | ORAL_CAPSULE | Freq: Two times a day (BID) | ORAL | Status: DC | PRN

## 2022-08-07 MED ORDER — MIDAZOLAM HCL 2 MG/2ML IJ SOLN
INTRAMUSCULAR | Status: DC | PRN
  Administered 2022-08-07: 2 mg via INTRAVENOUS

## 2022-08-07 MED ORDER — PHENYLEPHRINE 80 MCG/ML (10ML) SYRINGE FOR IV PUSH (FOR BLOOD PRESSURE SUPPORT)
PREFILLED_SYRINGE | INTRAVENOUS | Status: DC | PRN
  Administered 2022-08-07: 160 ug via INTRAVENOUS
  Administered 2022-08-07: 80 ug via INTRAVENOUS
  Administered 2022-08-07: 240 ug via INTRAVENOUS
  Administered 2022-08-07: 80 ug via INTRAVENOUS
  Administered 2022-08-07: 160 ug via INTRAVENOUS

## 2022-08-07 MED ORDER — CHLORHEXIDINE GLUCONATE CLOTH 2 % EX PADS
6.0000 | MEDICATED_PAD | Freq: Every day | CUTANEOUS | Status: DC
  Administered 2022-08-07: 6 via TOPICAL

## 2022-08-07 MED ORDER — METHYLPREDNISOLONE SODIUM SUCC 40 MG IJ SOLR
40.0000 mg | Freq: Two times a day (BID) | INTRAMUSCULAR | Status: AC
  Administered 2022-08-08: 40 mg via INTRAVENOUS
  Filled 2022-08-07: qty 1

## 2022-08-07 MED ORDER — POLYVINYL ALCOHOL 1.4 % OP SOLN: 1.0000 [drp] | Freq: Three times a day (TID) | OPHTHALMIC | Status: DC | PRN

## 2022-08-07 MED ORDER — MAGNESIUM 200 MG PO TABS
400.0000 mg | ORAL_TABLET | Freq: Two times a day (BID) | ORAL | Status: DC
  Filled 2022-08-07: qty 2

## 2022-08-07 MED ORDER — INSULIN ASPART 100 UNIT/ML IJ SOLN
0.0000 [IU] | Freq: Three times a day (TID) | INTRAMUSCULAR | Status: DC
  Administered 2022-08-08: 3 [IU] via SUBCUTANEOUS
  Administered 2022-08-08: 5 [IU] via SUBCUTANEOUS
  Administered 2022-08-08: 2 [IU] via SUBCUTANEOUS

## 2022-08-07 MED ORDER — CHLORHEXIDINE GLUCONATE 0.12 % MT SOLN
OROMUCOSAL | Status: AC
  Administered 2022-08-07: 15 mL via OROMUCOSAL
  Filled 2022-08-07: qty 15

## 2022-08-07 MED ORDER — LEVOTHYROXINE SODIUM 100 MCG PO TABS: 200.0000 ug | ORAL_TABLET | Freq: Every day | ORAL | Status: DC

## 2022-08-07 MED ORDER — POLYETHYLENE GLYCOL 3350 17 G PO PACK: 17.0000 g | PACK | Freq: Every day | ORAL | Status: DC | PRN

## 2022-08-07 MED ORDER — PANTOPRAZOLE SODIUM 40 MG IV SOLR
40.0000 mg | Freq: Two times a day (BID) | INTRAVENOUS | Status: DC
  Administered 2022-08-07: 40 mg via INTRAVENOUS
  Filled 2022-08-07: qty 10

## 2022-08-07 MED ORDER — ALBUMIN HUMAN 5 % IV SOLN: INTRAVENOUS | Status: DC | PRN

## 2022-08-07 MED ORDER — ALBUTEROL SULFATE (2.5 MG/3ML) 0.083% IN NEBU
INHALATION_SOLUTION | RESPIRATORY_TRACT | Status: AC
  Filled 2022-08-07: qty 3

## 2022-08-07 MED ORDER — ROCURONIUM BROMIDE 10 MG/ML (PF) SYRINGE
PREFILLED_SYRINGE | INTRAVENOUS | Status: DC | PRN
  Administered 2022-08-07: 60 mg via INTRAVENOUS

## 2022-08-07 MED ORDER — DEXTROSE 50 % IV SOLN
INTRAVENOUS | Status: AC
  Filled 2022-08-07: qty 50

## 2022-08-07 MED ORDER — ACETAMINOPHEN 500 MG PO TABS
1000.0000 mg | ORAL_TABLET | Freq: Once | ORAL | Status: DC
  Filled 2022-08-07: qty 2

## 2022-08-07 MED ORDER — DEXAMETHASONE SODIUM PHOSPHATE 10 MG/ML IJ SOLN
INTRAMUSCULAR | Status: DC | PRN
  Administered 2022-08-07: 10 mg via INTRAVENOUS

## 2022-08-07 MED ORDER — ALLOPURINOL 100 MG PO TABS
100.0000 mg | ORAL_TABLET | Freq: Every day | ORAL | Status: DC
  Administered 2022-08-08: 100 mg via ORAL
  Filled 2022-08-07: qty 1

## 2022-08-07 MED ORDER — SODIUM CHLORIDE 0.9 % IV SOLN: INTRAVENOUS | Status: DC

## 2022-08-07 MED ORDER — MAGNESIUM OXIDE -MG SUPPLEMENT 400 (240 MG) MG PO TABS
400.0000 mg | ORAL_TABLET | Freq: Two times a day (BID) | ORAL | Status: DC
  Administered 2022-08-08: 400 mg via ORAL
  Filled 2022-08-07: qty 1

## 2022-08-07 MED ORDER — ONDANSETRON HCL 4 MG/2ML IJ SOLN
INTRAMUSCULAR | Status: DC | PRN
  Administered 2022-08-07: 4 mg via INTRAVENOUS

## 2022-08-07 MED ORDER — IPRATROPIUM-ALBUTEROL 0.5-2.5 (3) MG/3ML IN SOLN: 3.0000 mL | Freq: Four times a day (QID) | RESPIRATORY_TRACT | Status: DC | PRN

## 2022-08-07 MED ORDER — ALBUTEROL SULFATE (2.5 MG/3ML) 0.083% IN NEBU: 2.5000 mg | INHALATION_SOLUTION | Freq: Once | RESPIRATORY_TRACT | Status: AC

## 2022-08-07 MED ORDER — CARBOXYMETHYLCELLULOSE SODIUM 0.5 % OP SOLN: 1.0000 [drp] | Freq: Three times a day (TID) | OPHTHALMIC | Status: DC | PRN

## 2022-08-07 MED ORDER — SUGAMMADEX SODIUM 200 MG/2ML IV SOLN
INTRAVENOUS | Status: DC | PRN
  Administered 2022-08-07: 400 mg via INTRAVENOUS

## 2022-08-07 MED ORDER — IPRATROPIUM-ALBUTEROL 0.5-2.5 (3) MG/3ML IN SOLN
3.0000 mL | Freq: Four times a day (QID) | RESPIRATORY_TRACT | Status: DC
  Administered 2022-08-07: 3 mL via RESPIRATORY_TRACT
  Filled 2022-08-07: qty 3

## 2022-08-07 SURGICAL SUPPLY — 30 items
BRUSH CYTOL CELLEBRITY 1.5X140 (MISCELLANEOUS) IMPLANT
CANISTER SUCT 3000ML PPV (MISCELLANEOUS) ×3 IMPLANT
CONT SPEC 4OZ CLIKSEAL STRL BL (MISCELLANEOUS) ×3 IMPLANT
COVER BACK TABLE 60X90IN (DRAPES) ×3 IMPLANT
COVER DOME SNAP 22 D (MISCELLANEOUS) ×3 IMPLANT
FORCEPS BIOP RJ4 1.8 (CUTTING FORCEPS) IMPLANT
GAUZE SPONGE 4X4 12PLY STRL (GAUZE/BANDAGES/DRESSINGS) ×3 IMPLANT
GLOVE BIO SURGEON STRL SZ7.5 (GLOVE) ×3 IMPLANT
GOWN STRL REUS W/ TWL LRG LVL3 (GOWN DISPOSABLE) ×3 IMPLANT
GOWN STRL REUS W/TWL LRG LVL3 (GOWN DISPOSABLE) ×3
KIT CLEAN ENDO COMPLIANCE (KITS) ×6 IMPLANT
KIT TURNOVER KIT B (KITS) ×3 IMPLANT
MARKER SKIN DUAL TIP RULER LAB (MISCELLANEOUS) ×3 IMPLANT
NDL EBUS SONO TIP PENTAX (NEEDLE) ×3 IMPLANT
NEEDLE EBUS SONO TIP PENTAX (NEEDLE) ×3 IMPLANT
NS IRRIG 1000ML POUR BTL (IV SOLUTION) ×3 IMPLANT
OIL SILICONE PENTAX (PARTS (SERVICE/REPAIRS)) ×3 IMPLANT
PAD ARMBOARD 7.5X6 YLW CONV (MISCELLANEOUS) ×6 IMPLANT
SOL ANTI FOG 6CC (MISCELLANEOUS) ×3 IMPLANT
SOLUTION ANTI FOG 6CC (MISCELLANEOUS) ×3
SYR 20CC LL (SYRINGE) ×6 IMPLANT
SYR 20ML ECCENTRIC (SYRINGE) ×6 IMPLANT
SYR 50ML SLIP (SYRINGE) IMPLANT
SYR 5ML LUER SLIP (SYRINGE) ×3 IMPLANT
TOWEL OR 17X24 6PK STRL BLUE (TOWEL DISPOSABLE) ×3 IMPLANT
TRAP SPECIMEN MUCOUS 40CC (MISCELLANEOUS) IMPLANT
TUBE CONNECTING 20X1/4 (TUBING) ×6 IMPLANT
UNDERPAD 30X30 (UNDERPADS AND DIAPERS) ×3 IMPLANT
VALVE DISPOSABLE (MISCELLANEOUS) ×3 IMPLANT
WATER STERILE IRR 1000ML POUR (IV SOLUTION) ×3 IMPLANT

## 2022-08-07 NOTE — H&P (Addendum)
NAME:  [name] [name], MRN:  [id number], DOB:  1951-04-12, LOS: 0 ADMISSION DATE:  08/07/2022, CONSULTATION DATE:  10/31 REFERRING MD:  icard, CHIEF COMPLAINT:  acute resp failure    History of Present Illness:  This is a 71 year old female with complicated history as mentioned below she presented to the endoscopy lab on 10/31 for an elective robotic assisted bronchoscopy for lung biopsy of a known left lower lung mass.  The patient was intubated, and placed under general anesthesia for the procedure, transbronchial needle brushings were obtained from the left lower lobe, Wang needle biopsies from the left lower lobe, and transbronchial forcep biopsies from the left lower lobe.  She was extubated and endoscopy, with initial plans for discharge home. While in recovery suite patient noted to have continued supplemental oxygen needs, she did not require oxygen prior to procedure.  Pulse oximetry dropping to percent on room air.  Portable chest x-ray was obtained postoperatively this showed Cardiomegaly: In bilateral airspace disease worrisome for mild pulmonary edema.  She was to be admitted for further evaluation of her new oxygen need, on pulmonary arrival she was slow to respond, speech was slurred, and had marked upper airway wheezing with accessory muscle use and minimal air movement through the general lung zones.  Was admitted to the pulmonary service in the intensive care for noninvasive positive pressure ventilation therapy and further support  Pertinent  Medical History  ESRD on HD->last HD 10/30, HFrEF, COPD, HL, HTN, hypothyroidism, TIA, LLL pulm nodule.   Significant Hospital Events: Including procedures, antibiotic start and stop dates in addition to other pertinent events   10/31 flex bronch w/ robotic assist w/ bx. Increased wheezing/more hypoxic marked UAW wheeze, encephalopathic post op. BIPAP, ICU steroids. CXR w/ low vol vs edema   Interim History / Subjective:  Endorses some  shortness of breath  Objective   Blood pressure (Abnormal) 136/59, pulse 86, temperature (Abnormal) 97 F (36.1 C), temperature source Temporal, resp. rate 18, height 5\' 3"  (1.6 m), weight 99 kg, SpO2 96 %.        Intake/Output Summary (Last 24 hours) at 08/07/2022 1732 Last data filed at 08/07/2022 1517 Gross per 24 hour  Intake 250 ml  Output no documentation  Net 250 ml   Filed Weights   08/06/22 1859 08/07/22 1051  Weight: 99.8 kg 99 kg    Examination: General: Chronically ill-appearing 71 year old female currently sitting up in bed HENT: She has a marked upper airway wheeze, mucous membranes are moist no JVD Lungs: Diminished throughout, does exhibit respiratory accessory use Cardiovascular: Regular rate and rhythm without murmur rub or gallop Abdomen: Soft not tender no organomegaly Extremities: Warm dry with brisk capillary refill left AV fistula intact trace lower extremity edema Neuro: Slow to awaken but will awaken to voice.  Follows commands but has generalized weakness, confused and speech is slurred GU: An uric  Resolved Hospital Problem list     Assessment & Plan:  Principal Problem:   Respiratory failure, post-operative (HCC) Active Problems:   Acute respiratory failure (HCC)   Pulmonary edema   Tobacco abuse   Class 2 obesity due to excess calories with body mass index (BMI) of 36.0 to 36.9 in adult   HTN (hypertension)   COPD with asthma   Hypothyroidism   OSA (obstructive sleep apnea)   DM (diabetes mellitus), type 2 with renal complications (HCC)   Chronic CHF (congestive heart failure) (HCC)   Anemia, unspecified   Chronic diastolic (congestive) heart failure (  [place])   ESRD (end stage renal disease) on dialysis ([place])   Lung mass     Postoperative acute hypoxic and hypercarbic respiratory failure.  Seemingly mix of pulmonary edema, and probable atelectasis given body habitus, superimposed on COPD and also suspect element of residual anesthesia  meds Plan Arterial blood gas Noninvasive positive pressure ventilation for both upper airway stenting, work of breathing, and possible hypercarbic respiratory failure Scheduled systemic steroids Inhaled bronchodilators  Acute metabolic encephalopathy, favoring hypercarbia Plan NIPPV Holding sedating medications Supportive care  Left lower lobe lung mass Plan Follow-up cytology and biopsy results  History of end-stage renal disease, she receives dialysis on Monday, Wednesday and Friday Last dialysis was on 10/30 Plan Checking chemistry Will likely need dialysis prior to discharge I will notify nephrology today, I think she will probably need dialysis prior to discharge  History of heart failure with reduced ejection fraction, also has history of diastolic grade 1 heart failure (EF 40 to 45%) Plan Telemetry Continue home carvedilol  History of hypothyroidism Plan Synthroid  History of diabetes Plan Sliding scale insulin       Best Practice (right click and "Reselect all SmartList Selections" daily)   Diet/type: NPO w/ oral meds DVT prophylaxis: SCD GI prophylaxis: PPI Lines: N/A Foley:  N/A Code Status:  full code Last date of multidisciplinary goals of care discussion [admitted ]  Labs   CBC: Recent Labs  Lab 08/07/22 1228  HGB 12.2  HCT 46.9    Basic Metabolic Panel: Recent Labs  Lab 08/07/22 1228  NA 141  K 4.6  CL 97*  GLUCOSE 123*  BUN 40*  CREATININE 4.40*   GFR: Estimated Creatinine Clearance: 13.1 mL/min (A) (by C-G formula based on SCr of 4.4 mg/dL (H)). No results for input(s): "PROCALCITON", "WBC", "LATICACIDVEN" in the last 168 hours.  Liver Function Tests: No results for input(s): "AST", "ALT", "ALKPHOS", "BILITOT", "PROT", "ALBUMIN" in the last 168 hours. No results for input(s): "LIPASE", "AMYLASE" in the last 168 hours. No results for input(s): "AMMONIA" in the last 168 hours.  ABG    Component Value Date/Time   PHART  7.37 12/06/2021 2247   PCO2ART 51 (H) 12/06/2021 2247   PO2ART 110 (H) 12/06/2021 2247   HCO3 29.5 (H) 12/06/2021 2247   TCO2 36 (H) 08/07/2022 1228   O2SAT 97.8 12/06/2021 2247     Coagulation Profile: No results for input(s): "INR", "PROTIME" in the last 168 hours.  Cardiac Enzymes: No results for input(s): "CKTOTAL", "CKMB", "CKMBINDEX", "TROPONINI" in the last 168 hours.  HbA1C: Hgb A1c MFr Bld  Date/Time Value Ref Range Status  11/22/2021 03:01 AM 6.3 (H) 4.8 - 5.6 % Final    Comment:    (NOTE) Pre diabetes:          5.7%-6.4%  Diabetes:              >6.4%  Glycemic control for   <7.0% adults with diabetes   02/15/2021 08:23 PM 6.8 (H) 4.8 - 5.6 % Final    Comment:    (NOTE) Pre diabetes:          5.7%-6.4%  Diabetes:              >6.4%  Glycemic control for   <7.0% adults with diabetes     CBG: Recent Labs  Lab 08/07/22 1049 08/07/22 1313 08/07/22 1411 08/07/22 1604  GLUCAP 138* 104* 102* 121*    Review of Systems:   Not able   Past Medical History:  She,  has a past medical history of Anemia, Arthritis, Asthma, Chronic diastolic (congestive) heart failure (HCC), Chronic kidney disease, Chronic pain syndrome, Class 2 obesity due to excess calories with body mass index (BMI) of 36.0 to 36.9 in adult, COPD (chronic obstructive pulmonary disease) ([place]), Diabetes mellitus without complication ([place]), Full dentures, GERD (gastroesophageal reflux disease), Gout, History of hiatal hernia, History of transfusion, MRSA infection, Hyperlipidemia, Hypertension, Hypothyroid, 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   Surgical History:   Past Surgical History:  Procedure Laterality Date   ABDOMINAL HYSTERECTOMY     AV FISTULA PLACEMENT Left 12/01/2021   Procedure: INSERTION OF LEFT ARM ARTERIOVENOUS (AV) GORE-TEX GRAFT;  Surgeon: Serafina Mitchell, MD;   Location: [facility];  Service: Vascular;  Laterality: Left;   BACK SURGERY  [address]   [place] Left 04/05/2020   Procedure: BASILIC VEIN TRANSPOSITION FIRST STAGE;  Surgeon: Angelia Mould, MD;  Location: [facility];  Service: Vascular;  Laterality: Left;   BRONCHIAL BIOPSY  01/23/2022   Procedure: BRONCHIAL BIOPSIES;  Surgeon: Garner Nash, DO;  Location: [facility];  Service: Pulmonary;;   BRONCHIAL NEEDLE ASPIRATION BIOPSY  01/23/2022   Procedure: BRONCHIAL NEEDLE ASPIRATION BIOPSIES;  Surgeon: Garner Nash, DO;  Location: [facility];  Service: Pulmonary;;   COLON SURGERY  1995   DUE TO POLYP   DILATION AND CURETTAGE OF UTERUS     ENDOBRONCHIAL ULTRASOUND  01/23/2022   Procedure: ENDOBRONCHIAL ULTRASOUND;  Surgeon: Garner Nash, DO;  Location: [facility];  Service: Pulmonary;;   ENTEROSCOPY N/A 07/10/2021   Procedure: ENTEROSCOPY;  Surgeon: Ronnette Juniper, MD;  Location: [facility];  Service: Gastroenterology;  Laterality: N/A;   ESOPHAGOGASTRODUODENOSCOPY N/A 01/25/2021   Procedure: ESOPHAGOGASTRODUODENOSCOPY (EGD);  Surgeon: Clarene Essex, MD;  Location: [facility];  Service: Endoscopy;  Laterality: N/A;   FINE NEEDLE ASPIRATION  01/23/2022   Procedure: FINE NEEDLE ASPIRATION (FNA) LINEAR;  Surgeon: Garner Nash, DO;  Location: [facility];  Service: Pulmonary;;   FOREARM FRACTURE SURGERY     Left arm   GIVENS CAPSULE STUDY N/A 07/07/2021   Procedure: GIVENS CAPSULE STUDY;  Surgeon: Clarene Essex, MD;  Location: [facility];  Service: Endoscopy;  Laterality: N/A;   HERNIA REPAIR     w/ mesh   HOT HEMOSTASIS N/A 07/10/2021   Procedure: HOT HEMOSTASIS (ARGON PLASMA COAGULATION/BICAP);  Surgeon: Ronnette Juniper, MD;  Location: [facility];  Service: Gastroenterology;  Laterality: N/A;   INCISION AND DRAINAGE ABSCESS Left 02/04/2013   Procedure: INCISION AND DRAINAGE LEFT BUTTOCK ABSCESS; INCISION AND DRAINAGE LEFT BREAST ABSCESS;  Surgeon: Harl Bowie, MD;   Location: [facility];  Service: General;  Laterality: Left;   INCISION AND DRAINAGE ABSCESS N/A 02/12/2013   Procedure: INCISION AND DEBRIDEMENT BUTTOCK WOUND ;  Surgeon: [name]. Georgette Dover, MD;  Location: [facility];  Service: General;  Laterality: N/A;   INCISION AND DRAINAGE ABSCESS N/A 02/14/2013   Procedure: INCISION AND DRAINAGE/DRESSING CHANGE;  Surgeon: Harl Bowie, MD;  Location: [facility];  Service: General;  Laterality: N/A;   INCISION AND DRAINAGE ABSCESS N/A 03/21/2015   Procedure: INCISION AND DRAINAGE PUBIC ABSCESS;  Surgeon: Excell Seltzer, MD;  Location: [facility];  Service: General;  Laterality: N/A;   INCISION AND DRAINAGE PERIRECTAL ABSCESS Left 02/10/2013   Procedure: IRRIGATION AND DEBRIDEMENT OF BUTTOCK/PERINEAL ABSCESS;  Surgeon: [name]. Georgette Dover, MD;  Location: [facility];  Service: General;  Laterality: Left;   INCISION AND DRAINAGE PERIRECTAL ABSCESS N/A 02/16/2013   Procedure: IRRIGATION AND DEBRIDEMENT PERINEAL ABSCESS;  Surgeon: Zenovia Jarred, MD;  Location: [facility];  Service: General;  Laterality: N/A;   IR FLUORO GUIDE CV LINE RIGHT  11/30/2021   IR US GUIDE VASC ACCESS RIGHT  11/30/2021   IRRIGATION AND DEBRIDEMENT ABSCESS N/A 02/06/2013   Procedure: IRRIGATION AND DEBRIDEMENT BUTTOCK ABSCESS AND DRESSING CHANGE;  Surgeon: Harl Bowie, MD;  Location: [facility];  Service: General;  Laterality: N/A;   IRRIGATION AND DEBRIDEMENT ABSCESS Left 02/08/2013   Procedure: IRRIGATION AND DEBRIDEMENT ABSCESS/DRESSING CHANGE;  Surgeon: Gwenyth Ober, MD;  Location: [facility];  Service: General;  Laterality: Left;   JOINT REPLACEMENT  2010 / 2012    LAPAROSCOPIC CHOLECYSTECTOMY     [place] N/A 07/02/2014   Procedure: LEFT HEART CATHETERIZATION WITH CORONARY ANGIOGRAM;  Surgeon: Lorretta Harp, MD;  [facility];  Service: Cardiovascular;  Laterality: N/A;   LOWER EXTREMITY ANGIOGRAM Right 04/20/2016   Procedure: Lower Extremity Angiogram;  Surgeon:  Elam Dutch, MD;  Location: [facility] CV LAB;  Service: Cardiovascular;  Laterality: Right;   MULTIPLE TOOTH EXTRACTIONS     PERIPHERAL VASCULAR CATHETERIZATION N/A 04/20/2016   Procedure: Abdominal Aortogram;  Surgeon: Elam Dutch, MD;  Location: [facility];  Service: Cardiovascular;  Laterality: N/A;   PERIPHERAL VASCULAR CATHETERIZATION Right 04/20/2016   Procedure: Peripheral Vascular Intervention;  Surgeon: Elam Dutch, MD;  Location: [facility];  Service: Cardiovascular;  Laterality: Right;  popiteal   RIGHT HEART CATH N/A 10/27/2018   Procedure: RIGHT HEART CATH;  Surgeon: Larey Dresser, MD;  Location: [facility];  Service: Cardiovascular;  Laterality: N/A;   RIGHT HEART CATH N/A 03/20/2019   Procedure: RIGHT HEART CATH;  Surgeon: Larey Dresser, MD;  Location: [facility];  Service: Cardiovascular;  Laterality: N/A;   THYROIDECTOMY     TOTAL HIP REVISION Right 08/11/2014   Procedure: RIGHT ACETABULAR REVISION;  Surgeon: Gearlean Alf, MD;  [facility];  Service: Orthopedics;  Laterality: Right;   VASCULAR SURGERY     VIDEO BRONCHOSCOPY WITH RADIAL ENDOBRONCHIAL ULTRASOUND  01/23/2022   Procedure: RADIAL ENDOBRONCHIAL ULTRASOUND;  Surgeon: Garner Nash, DO;  Location: [facility];  Service: Pulmonary;;     Social History:   reports that she quit smoking about 9 months ago. Her smoking use included cigarettes. She has a 10.00 pack-year smoking history. She has been exposed to tobacco smoke. She has quit using smokeless tobacco. She reports current alcohol use of about 1.0 standard drink of alcohol per week. She reports that she does not use drugs.   Family History:  Her family history includes Cancer in her father; Cardiomyopathy in her mother; Diabetes in her brother; Heart disease in her mother.   Allergies Allergies  Allergen Reactions   Nebivolol Swelling    Chest pain (reaction to Bystolic)   Zestril [Lisinopril]  Swelling and Other (See Comments)   Ace Inhibitors Swelling and Other (See Comments)    Tongue swell   Morphine And Related Itching     Home Medications  Prior to Admission medications   Medication Sig Start Date End Date Taking? Authorizing Provider  acetaminophen (TYLENOL) 500 MG tablet Take 1,000 mg by mouth 3 (three) times daily.   Yes [provider]  albuterol (PROVENTIL) (2.5 MG/3ML) 0.083% nebulizer solution Take 3 mLs (2.5  mg total) by nebulization every 6 (six) hours as needed for wheezing or shortness of breath. 12/08/21  Yes Demaio, Alexa, MD  albuterol (VENTOLIN HFA) 108 (90 Base) MCG/ACT inhaler Inhale 2 puffs into the lungs every 6 (six) hours as needed for shortness of breath (COPD). 12/29/21  Yes [provider]  allopurinol (ZYLOPRIM) 100 MG tablet Take 1 tablet (100 mg total) by mouth daily. 03/05/21  Yes Medina-Vargas, Monina C, NP  budesonide-formoterol (SYMBICORT) 160-4.5 MCG/ACT inhaler Inhale 2 puffs into the lungs in the morning and at bedtime.   Yes [provider]  carboxymethylcellulose (REFRESH PLUS) 0.5 % SOLN Place 1 drop into both eyes 3 (three) times daily as needed (dry/irritated eyes.).   Yes [provider]  carvedilol (COREG) 12.5 MG tablet Take 1 tablet (12.5 mg total) by mouth 2 (two) times daily. 03/05/21  Yes Medina-Vargas, Monina C, NP  cetirizine (ZYRTEC) 10 MG tablet Take 10 mg by mouth 2 (two) times daily. 04/17/22  Yes [provider]  clopidogrel (PLAVIX) 75 MG tablet Take 75 mg by mouth daily.   Yes [provider]  fluticasone (FLONASE) 50 MCG/ACT nasal spray Place 1 spray into both nostrils daily. 06/12/22  Yes [provider]  Insulin Lispro Prot & Lispro (HUMALOG 75/25 MIX) (75-25) 100 UNIT/ML Kwikpen Inject 10-20 Units into the skin See admin instructions. Take 20 units is the morning and 10 units at bedtime 12/29/21  Yes [provider]  levothyroxine (SYNTHROID) 200 MCG tablet  Take 1 tablet (200 mcg total) by mouth daily at 6 (six) AM. 03/05/21  Yes Medina-Vargas, Monina C, NP  lidocaine (LIDODERM) 5 % 2 patches every 12 (twelve) hours. 12 hours off 1 patch Each side of lower back 12/14/21  Yes [provider]  lidocaine-prilocaine (EMLA) cream SMARTSIG:sparingly Topical 3 Times a Week 02/13/22  Yes [provider]  magnesium oxide (MAG-OX) 400 (241.3 Mg) MG tablet Take 1 tablet (400 mg total) by mouth 2 (two) times daily. 01/14/20  Yes Lavina Hamman, MD  Multiple Vitamins-Iron (MULTIVITAMINS WITH IRON) TABS tablet Take 1 tablet by mouth in the morning.   Yes [provider]  pantoprazole (PROTONIX) 40 MG tablet Take 1 tablet (40 mg total) by mouth 2 (two) times daily before a meal. 07/10/21  Yes Amin, Ankit Chirag, MD  sevelamer carbonate (RENVELA) 800 MG tablet Take 1,600 mg by mouth 3 (three) times daily with meals.   Yes [provider]  thiamine (VITAMIN B-1) 100 MG tablet Take 100 mg by mouth in the morning.   Yes [provider]     Critical care time: 34 min     Erick Colace ACNP-BC [place] Pager # [phone] OR # [phone] if no answer

## 2022-08-07 NOTE — Transfer of Care (Signed)
Immediate Anesthesia Transfer of Care Note  Patient: HENSLEE LOTTMAN  Procedure(s) Performed: ROBOTIC ASSISTED NAVIGATIONAL BRONCHOSCOPY (Left) VIDEO BRONCHOSCOPY WITH ENDOBRONCHIAL ULTRASOUND (Bilateral) BRONCHIAL BIOPSIES BRONCHIAL BRUSHINGS BRONCHIAL NEEDLE ASPIRATION BIOPSIES  Patient Location: Endoscopy Unit  Anesthesia Type:General  Level of Consciousness: drowsy and patient cooperative  Airway & Oxygen Therapy: Patient Spontanous Breathing and Patient connected to nasal cannula oxygen  Post-op Assessment: Report given to RN, Post -op Vital signs reviewed and stable, and Patient moving all extremities  Post vital signs: Reviewed and stable  Last Vitals:  Vitals Value Taken Time  BP 124/88 08/07/22 1541  Temp 36.1 C 08/07/22 1541  Pulse 93 08/07/22 1543  Resp 21 08/07/22 1543  SpO2 68 % 08/07/22 1543  Vitals shown include unvalidated device data.  Last Pain:  Vitals:   08/07/22 1541  TempSrc: Temporal  PainSc: 0-No pain      Patients Stated Pain Goal: 3 (35/67/01 4103)  Complications: No notable events documented.

## 2022-08-07 NOTE — Progress Notes (Signed)
PCCM:  Unable to wean from O2.  She does not have O2 at home.  We will plan to admit for Obs and establish O2 needs  I spoke with Drs Lamonte Sakai and Marni Griffon, NP  Thanks for the help  Garner Nash, DO [facility] 08/07/2022 4:43 PM

## 2022-08-07 NOTE — Progress Notes (Signed)
Pt asked to have Bipap mask off. Explained to patient her ABG results were elevated and needed to continue to wear Bipap.  Pt pulled mask of and stated she was not wearing it anymore. Pt talking in full sentences no SOB, pr WOB noted. Pt placed on 6L [place], SpO2 98%.

## 2022-08-07 NOTE — Discharge Instructions (Signed)
Flexible Bronchoscopy, Care After This sheet gives you information about how to care for yourself after your test. Your doctor may also give you more specific instructions. If you have problems or questions, contact your doctor. Follow these instructions at home: Eating and drinking Do not eat or drink anything (not even water) for 2 hours after your test, or until your numbing medicine (local anesthetic) wears off. When your numbness is gone and your cough and gag reflexes have come back, you may: Eat only soft foods. Slowly drink liquids. The day after the test, go back to your normal diet. Driving Do not drive for 24 hours if you were given a medicine to help you relax (sedative). Do not drive or use heavy machinery while taking prescription pain medicine. General instructions  Take over-the-counter and prescription medicines only as told by your doctor. Return to your normal activities as told. Ask what activities are safe for you. Do not use any products that have nicotine or tobacco in them. This includes cigarettes and e-cigarettes. If you need help quitting, ask your doctor. Keep all follow-up visits as told by your doctor. This is important. It is very important if you had a tissue sample (biopsy) taken. Get help right away if: You have shortness of breath that gets worse. You get light-headed. You feel like you are going to pass out (faint). You have chest pain. You cough up: More than a little blood. More blood than before. Summary Do not eat or drink anything (not even water) for 2 hours after your test, or until your numbing medicine wears off. Do not use cigarettes. Do not use e-cigarettes. Get help right away if you have chest pain.  This information is not intended to replace advice given to you by your health care provider. Make sure you discuss any questions you have with your health care provider. Document Released: 07/22/2009 Document Revised: 09/06/2017 Document  Reviewed: 10/12/2016 Elsevier Patient Education  2020 [employer].

## 2022-08-07 NOTE — Anesthesia Procedure Notes (Signed)
Procedure Name: Intubation Date/Time: 08/07/2022 2:40 PM  Performed by: Moshe Salisbury, CRNAPre-anesthesia Checklist: Patient identified, Emergency Drugs available, Suction available and Patient being monitored Patient Re-evaluated:Patient Re-evaluated prior to induction Oxygen Delivery Method: Circle System Utilized Preoxygenation: Pre-oxygenation with 100% oxygen Induction Type: IV induction Ventilation: Mask ventilation without difficulty Laryngoscope Size: Mac and 3 Grade View: Grade II Tube type: Oral Tube size: 8.5 mm Number of attempts: 1 Airway Equipment and Method: Stylet Placement Confirmation: ETT inserted through vocal cords under direct vision, positive ETCO2 and breath sounds checked- equal and bilateral Secured at: 21 cm Tube secured with: Tape Dental Injury: Teeth and Oropharynx as per pre-operative assessment

## 2022-08-07 NOTE — Consult Note (Signed)
Reason for Consult:ESRD Referring Physician: Dr. Kary Kos  Chief Complaint: Elective bronchoscopy with biopsy of LLL lesions  [facility] 4hrs EDW 99kg Mircera 75 q2wks (last given 10/28) Heparin 2000 qtx, 2000 mid tx Hectorol 65mcg  Assessment/Plan: ESRD:  Appears that her EDW is 99.3-99.9kg and she left at 101.1kg on Thursday; missed HD Sat as well. Most likely the 104.4kg at 1810 10/31 is correct even though the admission weight was 99kg.  Plan on HD 1st shift tomorrow AM. 2-3L UF as tolerated via p LUE AVG which has a good bruit.  Additional recommendations - Dose all meds for creatinine clearance < 10 ml/min  - Unless absolutely necessary, no MRIs with gadolinium.  - Prefer needle sticks in the dorsum of the hands or wrists.  No blood pressure measurements in left arm. - If blood transfusion is requested during hemodialysis sessions, please alert [name] prior to the session.   Congestive heart failure: EF around 40 to 25% with diastolic dysfunction.  Will manage with UF at HD Left lower Lung nodule - concerning for malignancy s/p repeat bronchoscopy and biopsy on 10/31. COPD - longtime smoker.  Continue with nebs per primary PRN.  Likely etiology of SOB but certainly e/o volume overload as well.   Hyperphosphatemia: Sevelamer 2 TIDM  Anemia of CKD: Monitor hemoglobin trend.  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  Follow-up scan HTN: Coreg 12.5 mg twice daily  HPI: [name] [name] is an 71 y.o. female COPD, HFrEF, HLD, HTN, hypothyroidism. TIA, ESRD with prior left lower lobe pulmonary nodule identified in 2020. Repeat CT showed a 2.8x1.8 cm LLL pulmonary nodule concerning for lung cancer + weight loss. PET in 12/29/2021 showed a hypermetabolic 3cm LLL mass concerning for malignancy. Repeat PET 07/2022 showed 2 hypermetabolic nodules and pt brought to the hospital for biopsy. Of note patient has had XRT 03/2022 for a stage  1a, T1c, N0, M0 NSCLC. Patient admitted for a repeat bronchoscopy and biopsy which was performed by Dr. Valeta Harms on 08/07/2022. Patient had the biopsy under [facility] with initially plans for discharge home but was noted to have supplemental oxygen requirements which was new. CXR showed possible pulmonary edema. She was started on PPV support with wheezing and accessory muscle use.  Pt is TTS @ [facility] with last HD on 08/06/22 which was a full treatment leaving at 101.1 kg; EDW is listed as 99kg. True EDW appears to be more in the 99.3-99.9kg rang based on past 3 weeks of HD treatments.   ROS Pertinent items are noted in HPI.  Chemistry and CBC: Creatinine  Date/Time Value Ref Range Status  06/25/2022 09:55 AM 5.13 (HH) 0.44 - 1.00 mg/dL Final    Comment:    REPEATED TO VERIFY CRITICAL RESULT CALLED TO, READ BACK BY AND VERIFIED WITH: Yetta Glassman at 1055 on 18Sept22 by [facility]    02/26/2022 02:01 PM 5.95 (HH) 0.44 - 1.00 mg/dL Final    Comment:    REPEATED TO VERIFY CRITICAL RESULT CALLED TO, READ BACK BY AND VERIFIED WITH: Abelina Bachelor at 1419 on 22May23 by [facility]    10/17/2020 12:34 PM 3.51 (HH) 0.44 - 1.00 mg/dL Final    Comment:    REPEATED TO VERIFY CRITICAL RESULT CALLED TO, READ BACK BY AND VERIFIED WITH: NATALIE DAVIS RN @ 1344 BY J SCOTTON [id number]     Creat  Date/Time Value Ref Range Status  06/29/2014 11:00 AM 1.32 (H) 0.50 - 1.10 mg/dL  Final   Creatinine, Ser  Date/Time Value Ref Range Status  08/07/2022 12:28 PM 4.40 (H) 0.44 - 1.00 mg/dL Final  01/23/2022 10:56 AM 4.90 (H) 0.44 - 1.00 mg/dL Final  12/08/2021 09:52 AM 3.32 (H) 0.44 - 1.00 mg/dL Final  12/07/2021 04:22 AM 4.03 (H) 0.44 - 1.00 mg/dL Final  12/06/2021 01:34 AM 3.11 (H) 0.44 - 1.00 mg/dL Final  12/05/2021 01:19 AM 4.16 (H) 0.44 - 1.00 mg/dL Final  12/04/2021 01:30 AM 3.45 (H) 0.44 - 1.00 mg/dL Final  12/03/2021 12:45 AM 2.78 (H) 0.44 - 1.00 mg/dL Final  12/02/2021 06:02 AM 2.01 (H) 0.44 - 1.00 mg/dL Final   12/01/2021 07:52 AM 2.29 (H) 0.44 - 1.00 mg/dL Final  11/30/2021 07:45 AM 3.07 (H) 0.44 - 1.00 mg/dL Final  11/29/2021 03:13 AM 3.49 (H) 0.44 - 1.00 mg/dL Final  11/28/2021 03:11 AM 2.85 (H) 0.44 - 1.00 mg/dL Final  11/27/2021 01:48 AM 2.75 (H) 0.44 - 1.00 mg/dL Final  11/26/2021 01:22 AM 2.94 (H) 0.44 - 1.00 mg/dL Final  11/25/2021 01:10 AM 2.98 (H) 0.44 - 1.00 mg/dL Final  11/24/2021 07:38 AM 3.03 (H) 0.44 - 1.00 mg/dL Final  11/23/2021 01:30 AM 3.42 (H) 0.44 - 1.00 mg/dL Final  11/22/2021 02:59 AM 3.03 (H) 0.44 - 1.00 mg/dL Final  11/21/2021 02:13 PM 3.12 (H) 0.44 - 1.00 mg/dL Final  09/07/2021 10:36 AM 3.55 (H) 0.44 - 1.00 mg/dL Final  08/10/2021 10:57 AM 3.95 (H) 0.44 - 1.00 mg/dL Final  07/13/2021 11:38 AM 2.69 (H) 0.44 - 1.00 mg/dL Final  07/10/2021 03:05 AM 2.41 (H) 0.44 - 1.00 mg/dL Final  07/09/2021 05:47 AM 2.49 (H) 0.44 - 1.00 mg/dL Final  07/08/2021 02:29 AM 2.53 (H) 0.44 - 1.00 mg/dL Final  07/07/2021 03:03 AM 2.67 (H) 0.44 - 1.00 mg/dL Final    Comment:    DELTA CHECK NOTED  07/06/2021 01:10 AM 2.36 (H) 0.44 - 1.00 mg/dL Final    Comment:    DELTA CHECK NOTED  07/05/2021 03:37 AM 2.20 (H) 0.44 - 1.00 mg/dL Final    Comment:    DELTA CHECK NOTED  07/04/2021 01:37 AM 2.37 (H) 0.44 - 1.00 mg/dL Final  07/03/2021 02:46 AM 2.58 (H) 0.44 - 1.00 mg/dL Final  07/02/2021 06:22 AM 2.63 (H) 0.44 - 1.00 mg/dL Final  07/01/2021 04:01 AM 2.90 (H) 0.44 - 1.00 mg/dL Final  06/13/2021 01:35 PM 2.78 (H) 0.44 - 1.00 mg/dL Final  05/16/2021 12:35 PM 3.33 (H) 0.44 - 1.00 mg/dL Final  04/18/2021 12:46 PM 2.92 (H) 0.44 - 1.00 mg/dL Final  03/21/2021 12:53 PM 2.89 (H) 0.44 - 1.00 mg/dL Final  02/20/2021 05:22 AM 2.22 (H) 0.44 - 1.00 mg/dL Final  02/18/2021 05:52 AM 2.59 (H) 0.44 - 1.00 mg/dL Final  02/17/2021 05:50 AM 2.79 (H) 0.44 - 1.00 mg/dL Final  02/16/2021 05:00 AM 2.71 (H) 0.44 - 1.00 mg/dL Final  02/15/2021 08:23 PM 2.44 (H) 0.44 - 1.00 mg/dL Final  02/15/2021 12:47 PM 2.57  (H) 0.44 - 1.00 mg/dL Final  01/26/2021 05:28 AM 3.10 (H) 0.44 - 1.00 mg/dL Final  01/25/2021 05:40 AM 3.12 (H) 0.44 - 1.00 mg/dL Final  01/24/2021 02:26 PM 3.03 (H) 0.44 - 1.00 mg/dL Final  01/05/2021 04:45 PM 3.74 (H) 0.44 - 1.00 mg/dL Final  12/01/2020 11:19 AM 3.19 (H) 0.44 - 1.00 mg/dL Final  10/17/2020 04:55 PM 3.57 (H) 0.44 - 1.00 mg/dL Final  10/06/2020 09:25 AM 3.48 (H) 0.44 - 1.00 mg/dL Final  04/22/2020 11:29 AM 3.43 (H) 0.44 -  1.00 mg/dL Final  04/05/2020 11:25 AM 3.70 (H) 0.44 - 1.00 mg/dL Final   Recent Labs  Lab 08/07/22 1228  NA 141  K 4.6  CL 97*  GLUCOSE 123*  BUN 40*  CREATININE 4.40*   Recent Labs  Lab 08/07/22 1228  HGB 12.2  HCT 36.0   Liver Function Tests: No results for input(s): "AST", "ALT", "ALKPHOS", "BILITOT", "PROT", "ALBUMIN" in the last 168 hours. No results for input(s): "LIPASE", "AMYLASE" in the last 168 hours. No results for input(s): "AMMONIA" in the last 168 hours. Cardiac Enzymes: No results for input(s): "CKTOTAL", "CKMB", "CKMBINDEX", "TROPONINI" in the last 168 hours. Iron Studies: No results for input(s): "IRON", "TIBC", "TRANSFERRIN", "FERRITIN" in the last 72 hours. PT/INR: @LABRCNTIP (inr:5)  Xrays/Other Studies: ) Results for orders placed or performed during the hospital encounter of 08/07/22 (from the past 48 hour(s))  SARS Coronavirus 2 by RT PCR (hospital order, performed in [facility] hospital lab) *cepheid single result test* Anterior Nasal Swab     Status: None   Collection Time: 08/07/22 10:41 AM   Specimen: Anterior Nasal Swab  Result Value Ref Range   SARS Coronavirus 2 by RT PCR NEGATIVE NEGATIVE    Comment: (NOTE) SARS-CoV-2 target nucleic acids are NOT DETECTED.  The SARS-CoV-2 RNA is generally detectable in upper and lower respiratory specimens during the acute phase of infection. The lowest concentration of SARS-CoV-2 viral copies this assay can detect is 250 copies / mL. A negative result does not  preclude SARS-CoV-2 infection and should not be used as the sole basis for treatment or other patient management decisions.  A negative result may occur with improper specimen collection / handling, submission of specimen other than nasopharyngeal swab, presence of viral mutation(s) within the areas targeted by this assay, and inadequate number of viral copies (<250 copies / mL). A negative result must be combined with clinical observations, patient history, and epidemiological information.  Fact Sheet for Patients:   https://www.patel.info/  Fact Sheet for Healthcare Providers: https://hall.com/  This test is not yet approved or  cleared by the [place] FDA and has been authorized for detection and/or diagnosis of SARS-CoV-2 by FDA under an Emergency Use Authorization (EUA).  This EUA will remain in effect (meaning this test can be used) for the duration of the COVID-19 declaration under Section 564(b)(1) of the Act, 21 U.S.C. section 360bbb-3(b)(1), unless the authorization is terminated or revoked sooner.  Performed at [facility], [place] [address], [place], [place] [address]   Glucose, capillary     Status: Abnormal   Collection Time: 08/07/22 10:49 AM  Result Value Ref Range   Glucose-Capillary 138 (H) 70 - 99 mg/dL    Comment: Glucose reference range applies only to samples taken after fasting for at least 8 hours.   Comment 1 Notify RN    Comment 2 Document in Chart   I-STAT, chem 8     Status: Abnormal   Collection Time: 08/07/22 12:28 PM  Result Value Ref Range   Sodium 141 135 - 145 mmol/L   Potassium 4.6 3.5 - 5.1 mmol/L   Chloride 97 (L) 98 - 111 mmol/L   BUN 40 (H) 8 - 23 mg/dL   Creatinine, Ser 4.40 (H) 0.44 - 1.00 mg/dL   Glucose, Bld 123 (H) 70 - 99 mg/dL    Comment: Glucose reference range applies only to samples taken after fasting for at least 8 hours.   Calcium, Ion 1.25 1.15 - 1.40 mmol/L  TCO2 36  (H) 22 - 32 mmol/L   Hemoglobin 12.2 12.0 - 15.0 g/dL   HCT 36.0 36.0 - 46.0 %  Glucose, capillary     Status: Abnormal   Collection Time: 08/07/22  1:13 PM  Result Value Ref Range   Glucose-Capillary 104 (H) 70 - 99 mg/dL    Comment: Glucose reference range applies only to samples taken after fasting for at least 8 hours.  Glucose, capillary     Status: Abnormal   Collection Time: 08/07/22  2:11 PM  Result Value Ref Range   Glucose-Capillary 102 (H) 70 - 99 mg/dL    Comment: Glucose reference range applies only to samples taken after fasting for at least 8 hours.  Glucose, capillary     Status: Abnormal   Collection Time: 08/07/22  4:04 PM  Result Value Ref Range   Glucose-Capillary 121 (H) 70 - 99 mg/dL    Comment: Glucose reference range applies only to samples taken after fasting for at least 8 hours.  Blood gas, arterial     Status: Abnormal   Collection Time: 08/07/22  5:10 PM  Result Value Ref Range   pH, Arterial 7.24 (L) 7.35 - 7.45   pCO2 arterial 89 (HH) 32 - 48 mmHg    Comment: CRITICAL RESULT CALLED TO, READ BACK BY AND VERIFIED WITH: A CREWS, RN 1739 08/07/2022 WBOND    pO2, Arterial 85 83 - 108 mmHg   Bicarbonate 38.9 (H) 20.0 - 28.0 mmol/L   Acid-Base Excess 7.7 (H) 0.0 - 2.0 mmol/L   O2 Saturation 95 %   Patient temperature 36.1    Collection site RIGHT RADIAL    Drawn by [id number]    Allens test (pass/fail) PASS PASS    Comment: Performed at [facility], [place] [address], [place], [place] [address]  Glucose, capillary     Status: Abnormal   Collection Time: 08/07/22  6:06 PM  Result Value Ref Range   Glucose-Capillary 144 (H) 70 - 99 mg/dL    Comment: Glucose reference range applies only to samples taken after fasting for at least 8 hours.   DG C-ARM BRONCHOSCOPY  Result Date: 08/07/2022 C-ARM BRONCHOSCOPY: Fluoroscopy was utilized by the requesting physician.  No radiographic interpretation.   CT Super D Chest Wo Contrast  Result Date:  08/07/2022 CLINICAL DATA:  Tracer avid lung nodules identified on recent PET-CT from 07/09/2022 concerning for recurrent non-small cell lung cancer. * Tracking Code: BO * EXAM: CT CHEST WITHOUT CONTRAST TECHNIQUE: Multidetector CT imaging of the chest was performed using thin slice collimation for electromagnetic bronchoscopy planning purposes, without intravenous contrast. RADIATION DOSE REDUCTION: This exam was performed according to the departmental dose-optimization program which includes automated exposure control, adjustment of the mA and/or kV according to patient size and/or use of iterative reconstruction technique. COMPARISON:  PET-CT 07/09/2022 FINDINGS: Cardiovascular: Stable cardiac enlargement. Aortic atherosclerosis and coronary artery calcifications. No pericardial effusion. Mediastinum/Nodes: No enlarged axillary, supraclavicular, or mediastinal lymph nodes. Hilar lymph nodes are suboptimally evaluated due to lack of IV contrast. Trachea and esophagus are unremarkable. Lungs/Pleura: Mild changes of emphysema. The treated tumor within the central aspect of the left lower lobe is again noted measuring approximately 2.7 x 1.7 cm, image 73/4. Not significantly changed from 06/25/2022. Recent PET-CT shows no significant FDG uptake within this lesion. The previous FDG avid nodule within the posteromedial left lower lobe measures 2.4 by 1.9 cm, image 94/4. Previously this measured 1.9 x 1.6 cm. No new lung nodules.  The adjacent FDG avid nodule in the posteromedial left base measures 1.7 by 1.6 cm and appears unchanged from 07/09/2022. Upper Abdomen: No acute or suspicious findings. Bilateral low-attenuation enlargement of both adrenal glands is unchanged compared with 01/06/2020 compatible with underlying adrenal adenomas. Musculoskeletal: Remote bilateral healed rib fracture deformities are identified. IMPRESSION: 1. Persistent nodules within the posteromedial left lower lobe which were FDG avid on the  recent PET-CT. These remain worrisome for either locally recurrent tumor or metastatic disease. 2. Stable appearance of treated tumor within the central aspect of the left lower lobe. 3. Stable bilateral adrenal adenomas. No follow-up imaging recommended. 4.  Aortic Atherosclerosis (ICD10-I70.0). Electronically Signed   By: Kerby Moors M.D.   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   Full dentures    GERD (gastroesophageal reflux disease)    Gout    History of hiatal hernia    History of transfusion    Hx MRSA infection    abscess left groin   Hyperlipidemia    Hypertension    Hypothyroid    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HCC)    Peripheral vascular disease (HCC)    Sleep apnea    UNABLE TO TOLERATE C PAP   Stress incontinence    TIA (transient ischemic attack)     X2 NO RESIDUAL PROBLEMS   Wears glasses     PSH:   Past Surgical History:  Procedure Laterality Date   ABDOMINAL HYSTERECTOMY     AV FISTULA PLACEMENT Left 12/01/2021   Procedure: INSERTION OF LEFT ARM ARTERIOVENOUS (AV) GORE-TEX GRAFT;  Surgeon: Serafina Mitchell, MD;  Location: [facility];  Service: Vascular;  Laterality: Left;   BACK SURGERY  5852   [place] Left 04/05/2020   Procedure: BASILIC VEIN TRANSPOSITION FIRST STAGE;  Surgeon: Angelia Mould, MD;  Location: [facility];  Service: Vascular;  Laterality: Left;   BRONCHIAL BIOPSY  01/23/2022   Procedure: BRONCHIAL BIOPSIES;  Surgeon: Garner [place], DO;  Location: [facility];  Service: Pulmonary;;   BRONCHIAL NEEDLE ASPIRATION  BIOPSY  01/23/2022   Procedure: BRONCHIAL NEEDLE ASPIRATION BIOPSIES;  Surgeon: Garner [place], DO;  Location: [facility];  Service: Pulmonary;;   COLON SURGERY  1995   DUE TO POLYP   DILATION AND CURETTAGE OF UTERUS     ENDOBRONCHIAL ULTRASOUND  01/23/2022   Procedure: ENDOBRONCHIAL ULTRASOUND;  Surgeon: Garner [place], DO;  Location: [facility];  Service: Pulmonary;;   ENTEROSCOPY N/A 07/10/2021   Procedure: ENTEROSCOPY;  Surgeon: Ronnette Juniper, MD;  Location: [facility];  Service: Gastroenterology;  Laterality: N/A;   ESOPHAGOGASTRODUODENOSCOPY N/A 01/25/2021   Procedure: ESOPHAGOGASTRODUODENOSCOPY (EGD);  Surgeon: Clarene [place], MD;  Location: [facility];  Service: Endoscopy;  Laterality: N/A;   FINE NEEDLE ASPIRATION  01/23/2022   Procedure: FINE NEEDLE ASPIRATION (FNA) LINEAR;  Surgeon: Garner [place], DO;  Location: [facility];  Service: Pulmonary;;   FOREARM FRACTURE SURGERY     Left arm   GIVENS  CAPSULE STUDY N/A 07/07/2021   Procedure: GIVENS CAPSULE STUDY;  Surgeon: Clarene [place], MD;  Location: [facility];  Service: Endoscopy;  Laterality: N/A;   HERNIA REPAIR     w/ mesh   HOT HEMOSTASIS N/A 07/10/2021   Procedure: HOT HEMOSTASIS (ARGON PLASMA COAGULATION/BICAP);  Surgeon: Ronnette Juniper, MD;  Location: [facility];  Service: Gastroenterology;  Laterality: N/A;   INCISION AND DRAINAGE ABSCESS Left 02/04/2013   Procedure: INCISION AND DRAINAGE LEFT BUTTOCK ABSCESS; INCISION AND DRAINAGE LEFT BREAST ABSCESS;  Surgeon: Harl Bowie, MD;  Location: [facility];  Service: General;  Laterality: Left;   INCISION AND DRAINAGE ABSCESS N/A 02/12/2013   Procedure: INCISION AND DEBRIDEMENT BUTTOCK WOUND ;  Surgeon: [name]. Georgette Dover, MD;  Location: [facility];  Service: General;  Laterality: N/A;   INCISION AND DRAINAGE ABSCESS N/A 02/14/2013   Procedure: INCISION AND DRAINAGE/DRESSING CHANGE;  Surgeon: Harl Bowie, MD;  Location: [facility];  Service: General;  Laterality: N/A;   INCISION AND  DRAINAGE ABSCESS N/A 03/21/2015   Procedure: INCISION AND DRAINAGE PUBIC ABSCESS;  Surgeon: Excell Seltzer, MD;  Location: [facility];  Service: General;  Laterality: N/A;   INCISION AND DRAINAGE PERIRECTAL ABSCESS Left 02/10/2013   Procedure: IRRIGATION AND DEBRIDEMENT OF BUTTOCK/PERINEAL ABSCESS;  Surgeon: [name]. Georgette Dover, MD;  Location: [facility];  Service: General;  Laterality: Left;   INCISION AND DRAINAGE PERIRECTAL ABSCESS N/A 02/16/2013   Procedure: IRRIGATION AND DEBRIDEMENT PERINEAL ABSCESS;  Surgeon: Zenovia Jarred, MD;  Location: [facility];  Service: General;  Laterality: N/A;   IR FLUORO GUIDE CV LINE RIGHT  11/30/2021   IR US GUIDE VASC ACCESS RIGHT  11/30/2021   IRRIGATION AND DEBRIDEMENT ABSCESS N/A 02/06/2013   Procedure: IRRIGATION AND DEBRIDEMENT BUTTOCK ABSCESS AND DRESSING CHANGE;  Surgeon: Harl Bowie, MD;  Location: [facility];  Service: General;  Laterality: N/A;   IRRIGATION AND DEBRIDEMENT ABSCESS Left 02/08/2013   Procedure: IRRIGATION AND DEBRIDEMENT ABSCESS/DRESSING CHANGE;  Surgeon: Gwenyth Ober, MD;  Location: [facility];  Service: General;  Laterality: Left;   JOINT REPLACEMENT  2010 / 2012    LAPAROSCOPIC CHOLECYSTECTOMY     [place] N/A 07/02/2014   Procedure: LEFT HEART CATHETERIZATION WITH CORONARY ANGIOGRAM;  Surgeon: Lorretta Harp, MD;  Location: [facility];  Service: Cardiovascular;  Laterality: N/A;   LOWER EXTREMITY ANGIOGRAM Right 04/20/2016   Procedure: Lower Extremity Angiogram;  Surgeon: Elam [place], MD;  Location: [facility];  Service: Cardiovascular;  Laterality: Right;   MULTIPLE TOOTH EXTRACTIONS     PERIPHERAL VASCULAR CATHETERIZATION N/A 04/20/2016   Procedure: Abdominal Aortogram;  Surgeon: Elam [place], MD;  Location: [facility];  Service: Cardiovascular;  Laterality: N/A;   PERIPHERAL VASCULAR CATHETERIZATION Right 04/20/2016   Procedure: Peripheral Vascular Intervention;  Surgeon: Elam [place], MD;  Location: [facility];  Service: Cardiovascular;  Laterality: Right;  popiteal   RIGHT HEART CATH N/A 10/27/2018   Procedure: RIGHT HEART CATH;  Surgeon: Larey Dresser, MD;  Location: [facility];  Service: Cardiovascular;  Laterality: N/A;   RIGHT HEART CATH N/A 03/20/2019   Procedure: RIGHT HEART CATH;  Surgeon: Larey Dresser, MD;  Location: [facility];  Service: Cardiovascular;  Laterality: N/A;   THYROIDECTOMY     TOTAL HIP REVISION Right 08/11/2014   Procedure: RIGHT ACETABULAR REVISION;  Surgeon: Gearlean Alf, MD;  Location[facility];  Service: Orthopedics;  Laterality: Right;   VASCULAR SURGERY     VIDEO BRONCHOSCOPY WITH RADIAL ENDOBRONCHIAL ULTRASOUND  01/23/2022   Procedure: RADIAL ENDOBRONCHIAL ULTRASOUND;  Surgeon: Garner [place], DO;  Location: [facility];  Service: Pulmonary;;    Allergies:  Allergies  Allergen Reactions   Nebivolol Swelling    Chest pain (reaction to Bystolic)   Zestril [Lisinopril] Swelling and Other (See Comments)   Ace Inhibitors Swelling and Other (See Comments)    Tongue swell   Morphine And Related Itching    Medications:   Prior to Admission medications   Medication Sig Start Date End Date Taking? Authorizing Provider  acetaminophen (TYLENOL) 500 MG tablet Take 1,000 mg by mouth 3 (three) times daily.   Yes [provider]  albuterol (PROVENTIL) (2.5 MG/3ML) 0.083% nebulizer solution Take 3 mLs (2.5 mg total) by nebulization every 6 (six) hours as needed for wheezing or shortness of breath. 12/08/21  Yes Demaio, Alexa, MD  albuterol (VENTOLIN HFA) 108 (90 Base) MCG/ACT inhaler Inhale 2 puffs into the lungs every 6 (six) hours as needed for shortness of breath (COPD). 12/29/21  Yes [provider]  allopurinol (ZYLOPRIM) 100 MG tablet Take 1 tablet (100 mg total) by mouth daily. 03/05/21  Yes Medina-Vargas, Monina C, NP  budesonide-formoterol (SYMBICORT) 160-4.5 MCG/ACT inhaler Inhale 2 puffs  into the lungs in the morning and at bedtime.   Yes [provider]  carboxymethylcellulose (REFRESH PLUS) 0.5 % SOLN Place 1 drop into both eyes 3 (three) times daily as needed (dry/irritated eyes.).   Yes [provider]  carvedilol (COREG) 12.5 MG tablet Take 1 tablet (12.5 mg total) by mouth 2 (two) times daily. 03/05/21  Yes Medina-Vargas, Monina C, NP  cetirizine (ZYRTEC) 10 MG tablet Take 10 mg by mouth 2 (two) times daily. 04/17/22  Yes [provider]  clopidogrel (PLAVIX) 75 MG tablet Take 75 mg by mouth daily.   Yes [provider]  fluticasone (FLONASE) 50 MCG/ACT nasal spray Place 1 spray into both nostrils daily. 06/12/22  Yes [provider]  Insulin Lispro Prot & Lispro (HUMALOG 75/25 MIX) (75-25) 100 UNIT/ML Kwikpen Inject 10-20 Units into the skin See admin instructions. Take 20 units is the morning and 10 units at bedtime 12/29/21  Yes [provider]  levothyroxine (SYNTHROID) 200 MCG tablet Take 1 tablet (200 mcg total) by mouth daily at 6 (six) AM. 03/05/21  Yes Medina-Vargas, Monina C, NP  lidocaine (LIDODERM) 5 % 2 patches every 12 (twelve) hours. 12 hours off 1 patch Each side of lower back 12/14/21  Yes [provider]  lidocaine-prilocaine (EMLA) cream SMARTSIG:sparingly Topical 3 Times a Week 02/13/22  Yes [provider]  magnesium oxide (MAG-OX) 400 (241.3 Mg) MG tablet Take 1 tablet (400 mg total) by mouth 2 (two) times daily. 01/14/20  Yes Lavina Hamman, MD  Multiple Vitamins-Iron (MULTIVITAMINS WITH IRON) TABS tablet Take 1 tablet by mouth in the morning.   Yes [provider]  pantoprazole (PROTONIX) 40 MG tablet Take 1 tablet (40 mg total) by mouth 2 (two) times daily before a meal. 07/10/21  Yes Amin, Ankit Chirag, MD  sevelamer carbonate (RENVELA) 800 MG tablet Take 1,600 mg by mouth 3 (three) times daily with meals.   Yes [provider]  thiamine (VITAMIN B-1) 100 MG tablet Take 100 mg  by mouth in the morning.   Yes [provider]    Discontinued Meds:   Medications Discontinued During This Encounter  Medication Reason  polyethylene glycol (MIRALAX / GLYCOLAX) 17 g packet Patient Preference   multivitamin (RENA-VIT) TABS tablet Patient Preference   lubiprostone (AMITIZA) 24 MCG capsule Patient Preference   OVER THE COUNTER MEDICATION Patient Preference   dextrose 50 % solution Returned to [facility]    Social History:  reports that she quit smoking about 9 months ago. Her smoking use included cigarettes. She has a 10.00 pack-year smoking history. She has been exposed to tobacco smoke. She has quit using smokeless tobacco. She reports current alcohol use of about 1.0 standard drink of alcohol per week. She reports that she does not use drugs.  Family History:   Family History  Problem Relation Age of Onset   Diabetes Brother    Cardiomyopathy Mother    Heart disease Mother    Cancer Father     Blood pressure 134/64, pulse 86, temperature (!) 97.4 F (36.3 C), temperature source Axillary, resp. rate 18, height 5\' 3"  (1.6 m), weight 104.4 kg, SpO2 98 %. Gen:NAD but with PS ventilation, mildly tachypneic CVS: RRR Resp: CTAB but poor air movement Abd:+Bs, soft, NT/ND Ext: trace edema of RLE Access:  LUE AVG +T/B Back: no flank tenderness Neuro: Responds and follow commands       Dwana Melena, MD 08/07/2022, 6:22 PM

## 2022-08-07 NOTE — Op Note (Signed)
Video Bronchoscopy with Robotic Assisted Bronchoscopic Navigation   Date of Operation: 08/07/2022   Pre-op Diagnosis: Left lower lobe lung nodule  Post-op Diagnosis: Left lower lobe lung nodule  Surgeon: Garner Nash, DO  Assistants: None  Anesthesia: General endotracheal anesthesia  Operation: Flexible video fiberoptic bronchoscopy with robotic assistance and biopsies.  Estimated Blood Loss: Minimal  Complications: None  Indications and History: [name] [name] is a 71 y.o. female with history of left lower lobe lung nodule, concern for recurrence. The risks, benefits, complications, treatment options and expected outcomes were discussed with the patient.  The possibilities of pneumothorax, pneumonia, reaction to medication, pulmonary aspiration, perforation of a viscus, bleeding, failure to diagnose a condition and creating a complication requiring transfusion or operation were discussed with the patient who freely signed the consent.    Description of Procedure: The patient was seen in the Preoperative Area, was examined and was deemed appropriate to proceed.  The patient was taken to [facility] endoscopy room 3, identified as [name] [name] and the procedure verified as Flexible Video Fiberoptic Bronchoscopy.  A Time Out was held and the above information confirmed.   Prior to the date of the procedure a high-resolution CT scan of the chest was performed. Utilizing ION software program a virtual tracheobronchial tree was generated to allow the creation of distinct navigation pathways to the patient's parenchymal abnormalities. After being taken to the operating room general anesthesia was initiated and the patient  was orally intubated. The video fiberoptic bronchoscope was introduced via the endotracheal tube and a general inspection was performed which showed normal right and left lung anatomy, aspiration of the bilateral mainstems was completed to remove any remaining secretions.  Robotic catheter inserted into patient's endotracheal tube.   Target #1 left lower lobe lung nodule: The distinct navigation pathways prepared prior to this procedure were then utilized to navigate to patient's lesion identified on CT scan. The robotic catheter was secured into place and the vision probe was withdrawn.  Lesion location was approximated using fluoroscopy and radial endobronchial ultrasound for peripheral targeting. Under fluoroscopic guidance transbronchial needle brushings, transbronchial needle biopsies, and transbronchial forceps biopsies were performed to be sent for cytology and pathology.  At the end of the procedure a general airway inspection was performed and there was no evidence of active bleeding. The bronchoscope was removed.  The patient tolerated the procedure well. There was no significant blood loss and there were no obvious complications. A post-procedural chest x-ray is pending.  Samples Target #1: 1. Transbronchial needle brushings from left lower lobe 2. Transbronchial Wang needle biopsies from left lower lobe 3. Transbronchial forceps biopsies from left lower lobe  Plans:  The patient will be discharged from the PACU to home when recovered from anesthesia and after chest x-ray is reviewed. We will review the cytology, pathology results with the patient when they become available. Outpatient followup will be with Garner Nash, DO.  Garner Nash, DO [facility] 08/07/2022 4:10 PM

## 2022-08-07 NOTE — Anesthesia Preprocedure Evaluation (Addendum)
Anesthesia Evaluation  Patient identified by MRN, date of birth, ID band Patient awake    Reviewed: Allergy & Precautions, NPO status , Patient's Chart, lab work & pertinent test results, reviewed documented beta blocker date and time   History of Anesthesia Complications Negative for: history of anesthetic complications  Airway Mallampati: I  TM Distance: >3 FB Neck ROM: Full    Dental  (+) Edentulous Upper, Dental Advisory Given   Pulmonary asthma , sleep apnea , COPD,  COPD inhaler, former smoker   breath sounds clear to auscultation       Cardiovascular hypertension, Pt. on medications and Pt. on home beta blockers (-) angina + Peripheral Vascular Disease   Rhythm:Regular Rate:Normal  11/2021 ECHO: EF 40-45%. The LV has mildly decreased function, global hypokinesis. The left ventricular internal cavity size was mildly dilated. There is mild LVH. Grade I diastolic dysfunction, trivial MR, mild AS    Neuro/Psych TIA negative psych ROS   GI/Hepatic Neg liver ROS,GERD  Medicated and Controlled,,  Endo/Other  diabetes (glu 104), Insulin DependentHypothyroidism  Morbid obesityBMI 38.66  Renal/GU Dialysis and ESRFRenal disease (TuThSaTuThSa, but dialyzed yest)K+ 4.4     Musculoskeletal  (+) Arthritis ,    Abdominal  (+) + obese  Peds  Hematology negative hematology ROS (+)   Anesthesia Other Findings   Reproductive/Obstetrics                             Anesthesia Physical Anesthesia Plan  ASA: 3  Anesthesia Plan: General   Post-op Pain Management: Minimal or no pain anticipated   Induction:   PONV Risk Score and Plan: 3 and Ondansetron, Dexamethasone and Treatment may vary due to age or medical condition  Airway Management Planned: Oral ETT  Additional Equipment: None  Intra-op Plan:   Post-operative Plan: Extubation in OR  Informed Consent: I have reviewed the patients History  and Physical, chart, labs and discussed the procedure including the risks, benefits and alternatives for the proposed anesthesia with the patient or authorized representative who has indicated his/her understanding and acceptance.     Dental advisory given  Plan Discussed with: CRNA and Surgeon  Anesthesia Plan Comments:         Anesthesia Quick Evaluation

## 2022-08-07 NOTE — Progress Notes (Signed)
With pt permission this RN called pt son [name] to inform him of pt need to stay in hospital after procedure.

## 2022-08-07 NOTE — Anesthesia Postprocedure Evaluation (Signed)
Anesthesia Post Note  Patient: [name] [name]  Procedure(s) Performed: ROBOTIC ASSISTED NAVIGATIONAL BRONCHOSCOPY (Left) VIDEO BRONCHOSCOPY WITH ENDOBRONCHIAL ULTRASOUND (Bilateral) BRONCHIAL BIOPSIES BRONCHIAL BRUSHINGS BRONCHIAL NEEDLE ASPIRATION BIOPSIES     Patient location during evaluation: Endoscopy Anesthesia Type: General Level of consciousness: awake and alert, patient cooperative and oriented Pain management: pain level controlled Vital Signs Assessment: post-procedure vital signs reviewed and stable Respiratory status: spontaneous breathing and non-rebreather facemask (pt with increased secretions s/p bronch) Cardiovascular status: blood pressure returned to baseline and stable Postop Assessment: no apparent nausea or vomiting Anesthetic complications: no Comments: Dr. Valeta Harms to admit pt post op for respiratory care s/p bronch   No notable events documented.  Last Vitals:  Vitals:   08/07/22 1640 08/07/22 1700  BP: 125/60 (!) 136/59  Pulse: 83 86  Resp: 20 18  Temp:    SpO2: 95% 96%    Last Pain:  Vitals:   08/07/22 1541  TempSrc: Temporal  PainSc: 0-No pain                 Esty Ahuja,E. Aubrynn Katona

## 2022-08-07 NOTE — Interval H&P Note (Signed)
History and Physical Interval Note:  08/07/2022 2:07 PM  [name] [name]  has presented today for surgery, with the diagnosis of Lung nodule.  The various methods of treatment have been discussed with the patient and family. After consideration of risks, benefits and other options for treatment, the patient has consented to  Procedure(s) with comments: ROBOTIC ASSISTED NAVIGATIONAL BRONCHOSCOPY (Left) - ION w/ CIOS VIDEO BRONCHOSCOPY WITH ENDOBRONCHIAL ULTRASOUND (Bilateral) as a surgical intervention.  The patient's history has been reviewed, patient examined, no change in status, stable for surgery.  I have reviewed the patient's chart and labs.  Questions were answered to the patient's satisfaction.     [place]

## 2022-08-08 ENCOUNTER — Ambulatory Visit: Payer: 59 | Admitting: Pulmonary Disease

## 2022-08-08 ENCOUNTER — Inpatient Hospital Stay
Admission: RE | Admit: 2022-08-08 | Discharge: 2022-08-08 | Disposition: A | Discharge status: Home / Self Care | Payer: 59 | Source: Ambulatory Visit | Attending: Infectious Diseases | Admitting: Infectious Diseases

## 2022-08-08 ENCOUNTER — Other Ambulatory Visit: Payer: 59

## 2022-08-08 ENCOUNTER — Inpatient Hospital Stay (HOSPITAL_COMMUNITY): Payer: 59

## 2022-08-08 ENCOUNTER — Encounter (HOSPITAL_COMMUNITY): Payer: Self-pay | Admitting: Pulmonary Disease

## 2022-08-08 ENCOUNTER — Telehealth: Payer: Self-pay | Admitting: Nurse Practitioner

## 2022-08-08 DIAGNOSIS — J95821 Acute postprocedural respiratory failure: Secondary | ICD-10-CM | POA: Diagnosis not present

## 2022-08-08 LAB — BASIC METABOLIC PANEL
Anion gap: 14 (ref 5–15)
BUN: 48 mg/dL — ABNORMAL HIGH (ref 8–23)
CO2: 28 mmol/L (ref 22–32)
Calcium: 9.3 mg/dL (ref 8.9–10.3)
Chloride: 98 mmol/L (ref 98–111)
Creatinine, Ser: 5.19 mg/dL — ABNORMAL HIGH (ref 0.44–1.00)
GFR, Estimated: 8 mL/min — ABNORMAL LOW (ref >60)
Glucose, Bld: 188 mg/dL — ABNORMAL HIGH (ref 70–99)
Potassium: 5.1 mmol/L (ref 3.5–5.1)
Sodium: 140 mmol/L (ref 135–145)

## 2022-08-08 LAB — CBC
HCT: 35.2 % — ABNORMAL LOW (ref 36.0–46.0)
Hemoglobin: 10.3 g/dL — ABNORMAL LOW (ref 12.0–15.0)
MCH: 29.9 pg (ref 26.0–34.0)
MCHC: 29.3 g/dL — ABNORMAL LOW (ref 30.0–36.0)
MCV: 102 fL — ABNORMAL HIGH (ref 80.0–100.0)
Platelets: 215 10*3/uL (ref 150–400)
RBC: 3.45 MIL/uL — ABNORMAL LOW (ref 3.87–5.11)
RDW: 16 % — ABNORMAL HIGH (ref 11.5–15.5)
WBC: 9 10*3/uL (ref 4.0–10.5)
nRBC: 0.3 % — ABNORMAL HIGH (ref 0.0–0.2)

## 2022-08-08 LAB — GLUCOSE, CAPILLARY
Glucose-Capillary: 181 mg/dL — ABNORMAL HIGH (ref 70–99)
Glucose-Capillary: 164 mg/dL — ABNORMAL HIGH (ref 70–99)
Glucose-Capillary: 129 mg/dL — ABNORMAL HIGH (ref 70–99)
Glucose-Capillary: 205 mg/dL — ABNORMAL HIGH (ref 70–99)

## 2022-08-08 LAB — HEMOGLOBIN A1C
Hgb A1c MFr Bld: 7 % — ABNORMAL HIGH (ref 4.8–5.6)
Mean Plasma Glucose: 154.2 mg/dL

## 2022-08-08 LAB — MAGNESIUM: Magnesium: 2.1 mg/dL (ref 1.7–2.4)

## 2022-08-08 MED ORDER — LIDOCAINE 5 % EX PTCH
1.0000 | MEDICATED_PATCH | Freq: Every day | CUTANEOUS | Status: DC
  Administered 2022-08-08: 1 via TRANSDERMAL
  Filled 2022-08-08: qty 1

## 2022-08-08 MED ORDER — CARVEDILOL 12.5 MG PO TABS
12.5000 mg | ORAL_TABLET | Freq: Two times a day (BID) | ORAL | Status: DC
  Administered 2022-08-08 (×2): 12.5 mg via ORAL
  Filled 2022-08-08 (×2): qty 1

## 2022-08-08 MED ORDER — HEPARIN SODIUM (PORCINE) 1000 UNIT/ML IJ SOLN
INTRAMUSCULAR | Status: AC
  Administered 2022-08-08: 2500 [IU] via INTRAVENOUS
  Filled 2022-08-08: qty 1

## 2022-08-08 MED ORDER — PANTOPRAZOLE SODIUM 40 MG PO TBEC
40.0000 mg | DELAYED_RELEASE_TABLET | Freq: Two times a day (BID) | ORAL | Status: DC
  Administered 2022-08-08: 40 mg via ORAL
  Filled 2022-08-08: qty 1

## 2022-08-08 MED ORDER — IPRATROPIUM-ALBUTEROL 0.5-2.5 (3) MG/3ML IN SOLN
3.0000 mL | Freq: Three times a day (TID) | RESPIRATORY_TRACT | Status: DC
  Administered 2022-08-08 (×2): 3 mL via RESPIRATORY_TRACT
  Filled 2022-08-08 (×2): qty 3

## 2022-08-08 MED ORDER — HEPARIN SODIUM (PORCINE) 1000 UNIT/ML IJ SOLN: 2500.0000 [IU] | Freq: Once | INTRAMUSCULAR | Status: AC

## 2022-08-08 MED ORDER — HEPARIN SODIUM (PORCINE) 5000 UNIT/ML IJ SOLN
5000.0000 [IU] | Freq: Three times a day (TID) | INTRAMUSCULAR | Status: DC
  Administered 2022-08-08 (×2): 5000 [IU] via SUBCUTANEOUS
  Filled 2022-08-08 (×2): qty 1

## 2022-08-08 NOTE — Progress Notes (Signed)
Pt stood and ambulated at [facility]. Pt only ambulates/transfers to wheelchair at baseline.  On RA patient desats to 83% SPO2 sustained while standing and ambulating to and from wheelchair (without sitting).   SPO2 of 90-92% with ambulation to and from wheelchair (without sitting) on 2LNC   85% SPO2 on RA seated on BS (dangle)   Seated on BS (dangle) on 1lNC  maintains SPO2 of 90-92%

## 2022-08-08 NOTE — Progress Notes (Signed)
[facility] Progress Note  Subjective: seen in room, on HD, no c/o's. Wheezy. 2.7 L off w/ HD this am  Vitals:   08/08/22 1326 08/08/22 1405 08/08/22 1410 08/08/22 1415  BP: 112/64     Pulse: 90     Resp: 16     Temp:      TempSrc:      SpO2: (!) 89% (!) 86% (!) 89% 91%  Weight:      Height:        Exam: Gen alert, no distress No rash, cyanosis or gangrene Sclera anicteric, throat clear  No jvd or bruits Chest clear bilat to bases, no rales/ wheezing RRR no MRG Abd soft ntnd no mass or ascites +bs Ext no LE or UE edema Neuro is alert, Ox 3 , nf  LUE AVG+bruit     OP HD: [place] [facility]  4h  99kg  hep 2000+ 2000  LUE AVG    Assessment/ Plan: Left lower Lung nodule - concerning for malignancy s/p repeat bronchoscopy and biopsy on 10/31. ESRD: had OP HD Monday this week and is getting HD here today Congestive heart failure: EF around 40 to 70% with diastolic dysfunction COPD - longtime smoker.  Continue with nebs per primary PRN.  Likely etiology of SOB.  Hyperphosphatemia: Sevelamer 2 TIDM  Anemia of CKD: Monitor hemoglobin trend.  She is on Mircera 75q2weeks (last given 10/28) HTN: Coreg 12.5 mg twice daily Dispo:  If dc'd she can either just go to her OP HD on Sat (3 sessions this wk) or if she prefers she can go to OP HD tomorrow as well (4 sessions this week). Dispo - per pmd, ok for dc from renal standpoint   [name] [name] 08/08/2022, 2:18 PM   Recent Labs  Lab 08/07/22 1822 08/07/22 2321 08/08/22 0130  HGB 10.5* 10.9* 10.3*  ALBUMIN 3.7  --   --   CALCIUM 9.9  --  9.3  CREATININE 4.69*  --  5.19*  K 5.1 4.9 5.1   No results for input(s): "IRON", "TIBC", "FERRITIN" in the last 168 hours. Inpatient medications:  allopurinol  100 mg Oral Daily   budesonide (PULMICORT) nebulizer solution  0.5 mg Nebulization BID   carvedilol  12.5 mg Oral BID WC   Chlorhexidine Gluconate Cloth  6 each Topical Q0600   clopidogrel  75 mg Oral Daily   heparin  injection (subcutaneous)  5,000 Units Subcutaneous Q8H   insulin aspart  0-15 Units Subcutaneous TID WC   insulin aspart  0-5 Units Subcutaneous QHS   ipratropium-albuterol  3 mL Nebulization TID   levothyroxine  200 mcg Oral Q0600   magnesium oxide  400 mg Oral BID   pantoprazole  40 mg Oral BID   sevelamer carbonate  1,600 mg Oral TID WC    sodium chloride Stopped (08/07/22 1614)   docusate sodium, ipratropium-albuterol, polyethylene glycol, polyvinyl alcohol

## 2022-08-08 NOTE — Progress Notes (Signed)
NAME:  [name] [name], MRN:  [id number], DOB:  29-Sep-1951, LOS: 1 ADMISSION DATE:  08/07/2022, CONSULTATION DATE:  10/31 REFERRING MD:  icard, CHIEF COMPLAINT:  acute resp failure    History of Present Illness:  This is a 71 year old female with complicated history as mentioned below she presented to the endoscopy lab on 10/31 for an elective robotic assisted bronchoscopy for lung biopsy of a known left lower lung mass.  The patient was intubated, and placed under general anesthesia for the procedure, transbronchial needle brushings were obtained from the left lower lobe, Wang needle biopsies from the left lower lobe, and transbronchial forcep biopsies from the left lower lobe.  She was extubated and endoscopy, with initial plans for discharge home. While in recovery suite patient noted to have continued supplemental oxygen needs, she did not require oxygen prior to procedure.  Pulse oximetry dropping to percent on room air.  Portable chest x-ray was obtained postoperatively this showed Cardiomegaly: In bilateral airspace disease worrisome for mild pulmonary edema.  She was to be admitted for further evaluation of her new oxygen need, on pulmonary arrival she was slow to respond, speech was slurred, and had marked upper airway wheezing with accessory muscle use and minimal air movement through the general lung zones.  Was admitted to the pulmonary service in the intensive care for noninvasive positive pressure ventilation therapy and further support  Pertinent  Medical History  ESRD on HD->last HD 10/30, HFrEF, COPD, HL, HTN, hypothyroidism, TIA, LLL pulm nodule.   Significant Hospital Events: Including procedures, antibiotic start and stop dates in addition to other pertinent events   10/31 flex bronch w/ robotic assist w/ bx. Increased wheezing/more hypoxic marked UAW wheeze, encephalopathic post op. BIPAP, ICU steroids. CXR w/ low vol vs edema   Interim History / Subjective:  Wore BiPAP  overnight Potassium 5.1  Objective   Blood pressure (!) 116/56, pulse 94, temperature 98.7 F (37.1 C), temperature source Axillary, resp. rate (!) 30, height 5\' 3"  (1.6 m), weight 104.4 kg, SpO2 98 %.    Vent Mode: BIPAP;PCV FiO2 (%):  [30 %-50 %] 50 % Set Rate:  [20 bmp-24 bmp] 24 bmp PEEP:  [5 cmH20-6 cmH20] 5 cmH20   Intake/Output Summary (Last 24 hours) at 08/08/2022 0701 Last data filed at 08/07/2022 1800 Gross per 24 hour  Intake 250 ml  Output 0 ml  Net 250 ml   Filed Weights   08/07/22 1051 08/07/22 1810 08/08/22 0500  Weight: 99 kg 104.4 kg 104.4 kg    Examination: General: Obese woman laying in bed in no distress.  Awake.  On BiPAP HENT: Some pitched upper airway noise but improved Lungs: Decreased bibasilar breath sounds Cardiovascular: Regular, distant, no murmur Abdomen: Obese, nondistended, positive bowel sounds Extremities: Left AV fistula, just accessed for her HD. Neuro: Much more awake, interacting, answering questions, moves all extremities GU: Anuric  Resolved Hospital Problem list     Assessment & Plan:  Principal Problem:   Respiratory failure, post-operative ([place]) Active Problems:   Tobacco abuse   Class 2 obesity due to excess calories with body mass index (BMI) of 36.0 to 36.9 in adult   HTN (hypertension)   COPD with asthma   Hypothyroidism   OSA (obstructive sleep apnea)   DM (diabetes mellitus), type 2 with renal complications (HCC)   Chronic CHF (congestive heart failure) (HCC)   Anemia, unspecified   Chronic diastolic (congestive) heart failure (HCC)   Acute respiratory failure with hypoxia and hypercarbia (  [place])   ESRD (end stage renal disease) on dialysis ([place])   Lung mass   Acute respiratory failure ([place])   Pulmonary edema   Acute on chronic respiratory failure with hypoxia and hypercapnia (HCC)     Postoperative acute hypoxic and hypercarbic respiratory failure.  Seemingly mix of pulmonary edema, and probable atelectasis  given body habitus, superimposed on COPD and also suspect element of residual anesthesia meds Plan -Supportive care, wean FiO2 as able -Mobilize -Okay to continue to use BiPAP as needed -Steroids to end 11/1 -Bronchodilators  Acute metabolic encephalopathy, favoring hypercarbia.  Improved Plan -BiPAP available as needed -Avoid sedating medicines, narcotics  Left lower lobe lung mass Plan -Following cytology results  History of end-stage renal disease, she receives dialysis on Monday, Wednesday and Friday Last dialysis was on 10/30 Plan -Appreciate nephrology input will likely have HD today prior to discharge -Follow BMP  History of heart failure with reduced ejection fraction, also has history of diastolic grade 1 heart failure (EF 40 to 45%) Plan -Telemetry -Restart Home carvedilol -Plavix  History of hypothyroidism Plan -Continue Synthroid  History of diabetes Plan -Continue sliding scale insulin   Best Practice (right click and "Reselect all SmartList Selections" daily)   Diet/type: NPO w/ oral meds DVT prophylaxis: SCD GI prophylaxis: PPI Lines: N/A Foley:  N/A Code Status:  full code Last date of multidisciplinary goals of care discussion [pending]  Labs   CBC: Recent Labs  Lab 08/07/22 1228 08/07/22 1822 08/07/22 2321 08/08/22 0130  WBC  --  11.7*  --  9.0  NEUTROABS  --  10.2*  --   --   HGB 12.2 10.5* 10.9* 10.3*  HCT 36.0 36.8 32.0* 35.2*  MCV  --  103.1*  --  102.0*  PLT  --  213  --  993    Basic Metabolic Panel: Recent Labs  Lab 08/07/22 1031 08/07/22 1228 08/07/22 1822 08/07/22 2321 08/08/22 0130  NA  --  141 144 138 140  K 4.9 4.6 5.1 4.9 5.1  CL  --  97* 95*  --  98  CO2  --   --  30  --  28  GLUCOSE  --  123* 163*  --  188*  BUN  --  40* 41*  --  48*  CREATININE  --  4.40* 4.69*  --  5.19*  CALCIUM  --   --  9.9  --  9.3  MG  --   --   --   --  2.1   GFR: Estimated Creatinine Clearance: 11.5 mL/min (A) (by C-G formula  based on SCr of 5.19 mg/dL (H)). Recent Labs  Lab 08/07/22 1822 08/07/22 2023 08/07/22 2158 08/08/22 0130  PROCALCITON  --   --  0.33  --   WBC 11.7*  --   --  9.0  LATICACIDVEN 1.1 1.1  --   --     Liver Function Tests: Recent Labs  Lab 08/07/22 1822  AST 31  ALT 22  ALKPHOS 146*  BILITOT 0.9  PROT 7.0  ALBUMIN 3.7   No results for input(s): "LIPASE", "AMYLASE" in the last 168 hours. No results for input(s): "AMMONIA" in the last 168 hours.  ABG    Component Value Date/Time   PHART 7.278 (L) 08/07/2022 2321   PCO2ART 72.3 (HH) 08/07/2022 2321   PO2ART 55 (L) 08/07/2022 2321   HCO3 34.1 (H) 08/07/2022 2321   TCO2 36 (H) 08/07/2022 2321   O2SAT 84 08/07/2022 2321  HbA1C: Hgb A1c MFr Bld  Date/Time Value Ref Range Status  08/08/2022 01:30 AM 7.0 (H) 4.8 - 5.6 % Final    Comment:    (NOTE) Pre diabetes:          5.7%-6.4%  Diabetes:              >6.4%  Glycemic control for   <7.0% adults with diabetes   11/22/2021 03:01 AM 6.3 (H) 4.8 - 5.6 % Final    Comment:    (NOTE) Pre diabetes:          5.7%-6.4%  Diabetes:              >6.4%  Glycemic control for   <7.0% adults with diabetes     CBG: Recent Labs  Lab 08/07/22 1411 08/07/22 1604 08/07/22 1806 08/07/22 2302 08/08/22 0315  GLUCAP 102* 121* 144* 186* 181*     Critical care time:  31 minutes    Baltazar Apo, MD, PhD 08/08/2022, 7:01 AM [facility] [phone] or if no answer before 7:00PM call [phone] For any issues after 7:00PM please call eLink [phone]

## 2022-08-08 NOTE — Plan of Care (Signed)
Pt verbalizes understanding and states desire to be discharged

## 2022-08-08 NOTE — Discharge Summary (Signed)
Physician Discharge Summary         Patient ID: [name] [name] MRN: [id number] DOB/AGE: [birth year] 71 y.o.  Admit date: 08/07/2022 Discharge date: 08/08/2022  Discharge Diagnoses:   Principal Problem:   Respiratory failure, post-operative ([place]) Active Problems:   Tobacco abuse   Class 2 obesity due to excess calories with body mass index (BMI) of 36.0 to 36.9 in adult   HTN (hypertension)   COPD with asthma   Hypothyroidism   OSA (obstructive sleep apnea)   DM (diabetes mellitus), type 2 with renal complications (HCC)   Chronic CHF (congestive heart failure) (HCC)   Anemia, unspecified   Chronic diastolic (congestive) heart failure (HCC)   Acute respiratory failure with hypoxia and hypercarbia (HCC)   ESRD (end stage renal disease) on dialysis ([place])   Lung mass   Acute respiratory failure (HCC)   Pulmonary edema   Acute on chronic respiratory failure with hypoxia and hypercapnia [facility]    Discharge summary   This is a 71 year old female with complicated history as mentioned below she presented to the endoscopy lab on 10/31 for an elective robotic assisted bronchoscopy for lung biopsy of a known left lower lung mass.  The patient was intubated, and placed under general anesthesia for the procedure, transbronchial needle brushings were obtained from the left lower lobe, Wang needle biopsies from the left lower lobe, and transbronchial forcep biopsies from the left lower lobe.  She was extubated and endoscopy, with initial plans for discharge home. While in recovery suite patient noted to have continued supplemental oxygen needs, she did not require oxygen prior to procedure.  Pulse oximetry dropping to percent on room air.  Portable chest x-ray was obtained postoperatively this showed Cardiomegaly: In bilateral airspace disease worrisome for mild pulmonary edema.  She was to be admitted for further evaluation of her new oxygen need, on pulmonary arrival she was slow to respond,  speech was slurred, and had marked upper airway wheezing with accessory muscle use and minimal air movement through the general lung zones.  Was admitted to the pulmonary service in the intensive care for noninvasive positive pressure ventilation therapy and further support. Patient's mental status improved with bipap likely due to hypercarbia. Started on steroids and bronchodilators. On 11/1 steroids off and off bipap. Still requiring 2L O2. Walk test performed confirming patient needing at least 2L [place] to maintain normal O2 saturation. Patient stable and ready for discharge. Will send home with O2. Will need formal sleep study outpatient for possible CPAP needs.   Discharge Plan by Active Problems   Postoperative acute hypoxic and hypercarbic respiratory failure.   -Seemingly mix of pulmonary edema, and probable atelectasis given body habitus, superimposed on COPD and also suspect element of residual anesthesia meds Plan: -continue [place] 2L at all times (day and night) -will need TOC assistance with supplementing O2 for home needs -follow up with [facility] in 2 weeks to discuss O2 needs and lung mass biopsy results -will need sleep study performed outpatient for possible cpap need -continue home symbicort BID and prn albuterol for wheezing    Left lower lobe lung mass Plan: -Following cytology results -follow up with [facility] in 2 weeks   History of end-stage renal disease, she receives dialysis on Monday, Wednesday and Friday Last dialysis was on 11/1 Plan: -continue HD outpatient   History of heart failure with reduced ejection fraction, also has history of diastolic grade 1 heart failure (EF 40 to 45%) Plan: -continue home carvedilol and  plavix -follow up with PCP within 4 weeks of discharge   History of hypothyroidism Plan: -Continue Synthroid   History of diabetes Plan: -Continue home [facility] tests/ studies  10/31: CT Chest:  emphysematous changes, left lower lobe tumor measuring 2.7 x 1.7 with no significant change from 9.18.  Previous FDG avid nodule within posteromedial left lower lobe measures 2.4 x  1.9.    Procedures   10/31: Flexible video fiberoptic bronchoscopy with robotic assistance and biopsy with Dr Valeta Harms  Culture data/antimicrobials      Consults  Pulmonary Critical Care Nephrology    Discharge Exam: BP (!) 109/53   Pulse 86   Temp 98.1 F (36.7 C) (Axillary)   Resp 14   Ht 5\' 3"  (1.6 m)   Wt 104.4 kg   SpO2 94%   BMI 40.77 kg/m   General: Obese woman laying in bed in no distress.  Awake.  On BiPAP HENT: Some pitched upper airway noise but improved Lungs: Decreased bibasilar breath sounds Cardiovascular: Regular, distant, no murmur Abdomen: Obese, nondistended, positive bowel sounds Extremities: Left AV fistula, just accessed for her HD. Neuro: Much more awake, interacting, answering questions, moves all extremities GU: Anuric Labs at discharge   Lab Results  Component Value Date   CREATININE 5.19 (H) 08/08/2022   BUN 48 (H) 08/08/2022   NA 140 08/08/2022   K 5.1 08/08/2022   CL 98 08/08/2022   CO2 28 08/08/2022   Lab Results  Component Value Date   WBC 9.0 08/08/2022   HGB 10.3 (L) 08/08/2022   HCT 35.2 (L) 08/08/2022   MCV 102.0 (H) 08/08/2022   PLT 215 08/08/2022   Lab Results  Component Value Date   ALT 22 08/07/2022   AST 31 08/07/2022   ALKPHOS 146 (H) 08/07/2022   BILITOT 0.9 08/07/2022   Lab Results  Component Value Date   INR 1.0 07/01/2021   INR 1.0 01/05/2021   INR 1.2 01/06/2020    Current radiological studies    DG CHEST PORT 1 VIEW  Result Date: 08/08/2022 CLINICAL DATA:  Reason for exam: S/P bronchoscopy lung nodules EXAM: PORTABLE CHEST - 1 VIEW COMPARISON:  01/23/2022 FINDINGS: No pneumothorax. Central pulmonary vascular congestion with right hilar fullness. Suspect mild interstitial edema in the lung bases. Stable cardiomegaly. No  effusion. Visualized bones unremarkable. IMPRESSION: 1. No pneumothorax post bronchoscopy. 2. Cardiomegaly with central pulmonary vascular congestion. Electronically Signed   By: Lucrezia Europe M.D.   On: 08/08/2022 08:14   DG Chest Port 1 View  Result Date: 08/08/2022 CLINICAL DATA:  Pulmonary edema EXAM: PORTABLE CHEST - 1 VIEW COMPARISON:  the previous day's study FINDINGS: Some interval improvement in the mild interstitial edema and decrease in central pulmonary vascular congestion. Stable cardiomegaly. No effusion. Visualized bones unremarkable. IMPRESSION: Improving interstitial edema. Electronically Signed   By: Lucrezia Europe M.D.   On: 08/08/2022 08:12   DG C-ARM BRONCHOSCOPY  Result Date: 08/07/2022 C-ARM BRONCHOSCOPY: Fluoroscopy was utilized by the requesting physician.  No radiographic interpretation.   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 * Tracking Code: BO * EXAM: CT CHEST WITHOUT CONTRAST TECHNIQUE: Multidetector CT imaging of the chest was performed using thin slice collimation for electromagnetic bronchoscopy planning purposes, without intravenous contrast. RADIATION DOSE REDUCTION: This exam was performed according to the departmental dose-optimization program  which includes automated exposure control, adjustment of the mA and/or kV according to patient size and/or use of iterative reconstruction technique. COMPARISON:  PET-CT 07/09/2022 FINDINGS: Cardiovascular: Stable cardiac enlargement. Aortic atherosclerosis and coronary artery calcifications. No pericardial effusion. Mediastinum/Nodes: No enlarged axillary, supraclavicular, or mediastinal lymph nodes. Hilar lymph nodes are suboptimally evaluated due to lack of IV contrast. Trachea and esophagus are unremarkable. Lungs/Pleura: Mild changes of emphysema. The treated tumor within the  central aspect of the left lower lobe is again noted measuring approximately 2.7 x 1.7 cm, image 73/4. Not significantly changed from 06/25/2022. Recent PET-CT shows no significant FDG uptake within this lesion. The previous FDG avid nodule within the posteromedial left lower lobe measures 2.4 by 1.9 cm, image 94/4. Previously this measured 1.9 x 1.6 cm. No new lung nodules. The adjacent FDG avid nodule in the posteromedial left base measures 1.7 by 1.6 cm and appears unchanged from 07/09/2022. Upper Abdomen: No acute or suspicious findings. Bilateral low-attenuation enlargement of both adrenal glands is unchanged compared with 01/06/2020 compatible with underlying adrenal adenomas. Musculoskeletal: Remote bilateral healed rib fracture deformities are identified. IMPRESSION: 1. Persistent nodules within the posteromedial left lower lobe which were FDG avid on the recent PET-CT. These remain worrisome for either locally recurrent tumor or metastatic disease. 2. Stable appearance of treated tumor within the central aspect of the left lower lobe. 3. Stable bilateral adrenal adenomas. No follow-up imaging recommended. 4.  Aortic Atherosclerosis (ICD10-I70.0). Electronically Signed   By: Kerby Moors M.D.   On: 08/07/2022 11:31    Disposition:  Discharge Home      Allergies as of 08/08/2022       Reactions   Nebivolol Swelling   Chest pain (reaction to Bystolic)   Zestril [lisinopril] Swelling, Other (See Comments)   Ace Inhibitors Swelling, Other (See Comments)   Tongue swell   Morphine And Related Itching        Medication List     TAKE these medications    acetaminophen 500 MG tablet Commonly known as: TYLENOL Take 1,000 mg by mouth 3 (three) times daily.   albuterol (2.5 MG/3ML) 0.083% nebulizer solution Commonly known as: PROVENTIL Take 3 mLs (2.5 mg total) by nebulization every 6 (six) hours as needed for wheezing or shortness of breath.   albuterol 108 (90 Base) MCG/ACT  inhaler Commonly known as: VENTOLIN HFA Inhale 2 puffs into the lungs every 6 (six) hours as needed for shortness of breath (COPD).   allopurinol 100 MG tablet Commonly known as: ZYLOPRIM Take 1 tablet (100 mg total) by mouth daily.   budesonide-formoterol 160-4.5 MCG/ACT inhaler Commonly known as: SYMBICORT Inhale 2 puffs into the lungs in the morning and at bedtime.   carboxymethylcellulose 0.5 % Soln Commonly known as: REFRESH PLUS Place 1 drop into both eyes 3 (three) times daily as needed (dry/irritated eyes.).   carvedilol 12.5 MG tablet Commonly known as: COREG Take 1 tablet (12.5 mg total) by mouth 2 (two) times daily.   cetirizine 10 MG tablet Commonly known as: ZYRTEC Take 10 mg by mouth 2 (two) times daily.   clopidogrel 75 MG tablet Commonly known as: PLAVIX Take 75 mg by mouth daily.   fluticasone 50 MCG/ACT nasal spray Commonly known as: FLONASE Place 1 spray into both nostrils daily.   Insulin Lispro Prot & Lispro (75-25) 100 UNIT/ML Kwikpen Commonly known as: HUMALOG 75/25 MIX Inject 10-20 Units into the skin See admin instructions. Take 20 units is the morning and 10 units at bedtime  levothyroxine 200 MCG tablet Commonly known as: SYNTHROID Take 1 tablet (200 mcg total) by mouth daily at 6 (six) AM.   lidocaine 5 % Commonly known as: LIDODERM 2 patches every 12 (twelve) hours. 12 hours off 1 patch Each side of lower back   lidocaine-prilocaine cream Commonly known as: EMLA SMARTSIG:sparingly Topical 3 Times a Week   magnesium oxide 400 (241.3 Mg) MG tablet Commonly known as: MAG-OX Take 1 tablet (400 mg total) by mouth 2 (two) times daily.   multivitamins with iron Tabs tablet Take 1 tablet by mouth in the morning.   pantoprazole 40 MG tablet Commonly known as: PROTONIX Take 1 tablet (40 mg total) by mouth 2 (two) times daily before a meal.   sevelamer carbonate 800 MG tablet Commonly known as: RENVELA Take 1,600 mg by mouth 3 (three)  times daily with meals.   thiamine 100 MG tablet Commonly known as: Vitamin B-1 Take 100 mg by mouth in the morning.         Follow-up appointment   Follow up with [facility] in 2 weeks, a message has been sent to the scheduling assistant.   Discharge Condition:    stable  Physician Statement:   35 minutes of time have been dedicated to discharge assessment, planning and discharge instructions.   Signed: Mick Sell 08/08/2022, 4:04 PM

## 2022-08-08 NOTE — Procedures (Signed)
HD Note:  Some information was entered later than the data was gathered due to patient care needs. The stated time with the data is accurate.    Patient alert and oriented.  Informed consent signed and in chart.   New orders received from Dr. Jonnie Finner while he was rounding to raise the UF to 3500.  Patient complained of cramping and UF lowered to 2700 for the goal. Patient complained of back and neck pain through the treatment with repositioning used until the treatment would end and medication would be helpful.  Access used: AVG upper right arm Access issues: Positional with needle placement.  Required Heparin 2500 to run with pressures WDL at 350 BFR  Total UF removed: [facility]

## 2022-08-08 NOTE — Progress Notes (Addendum)
[facility] Progress Note Patient Name: [name] [name] DOB: 05-14-1951 MRN: [id number]   Date of Service  08/08/2022  HPI/Events of Note  Patient lethargic d/t hypercapnia with pH = 7.24 and pCO2 = 89. Patient placed on BiPAP and repeat ABG 7.27/72.3/55/34.1. Patient is more arousable now. ABG c/w chronic hypoxia and hypercarbia. Nursing asking for pain meds. I am not comfortable in giving this patient pain medication at this time.   eICU Interventions  Plan: Titrate O2 to keep sat = 88-90% Otherwise, Continue present management.      Intervention Category Major Interventions: Respiratory failure - evaluation and management  Kou Gucciardo Eugene 08/08/2022, 12:26 AM

## 2022-08-08 NOTE — Progress Notes (Addendum)
Inpatient Diabetes Program Recommendations  AACE/ADA: New Consensus Statement on Inpatient Glycemic Control (2015)  Target Ranges:  Prepandial:   less than 140 mg/dL      Peak postprandial:   less than 180 mg/dL (1-2 hours)      Critically ill patients:  140 - 180 mg/dL   Lab Results  Component Value Date   GLUCAP 164 (H) 08/08/2022   HGBA1C 7.0 (H) 08/08/2022    Review of Glycemic Control  Latest Reference Range & Units 08/07/22 18:06 08/07/22 23:02 08/08/22 03:15 08/08/22 08:00  Glucose-Capillary 70 - 99 mg/dL 144 (H) 186 (H) 181 (H) 164 (H)   Diabetes history: DM 2 Outpatient Diabetes medications:  Humalog 75/25 20 units in the AM and 10 units q PM Current orders for Inpatient glycemic control:  Novolog moderate tid with meals and HS  Inpatient Diabetes Program Recommendations:    May consider adding Levemir 5 units bid. Also consider reducing Novolog correction to sensitive (0-9 units) tid with meals.    Thanks,  Adah Perl, RN, BC-ADM Inpatient Diabetes Coordinator Pager [phone]  (8a-5p)

## 2022-08-08 NOTE — TOC Initial Note (Signed)
Transition of Care [facility]) - Initial/Assessment Note    Patient Details  Name: [name] [name] MRN: [id number] Date of Birth: 07-Jul-1951  [facility] CM/SW Contact:    Tom-Johnson, Renea Ee, RN Phone Number: 08/08/2022, 12:22 PM  Clinical Narrative:                  CM went to speak with patient at bedside about needs for post hospital transition, patient was having dialysis  at bedside and agreed for CM to speak with her son [name] [name]. Patient is admitted for Post op Respiratory Failure. Had elective Bronchoscopy for Lung Biopsy of Left Lower Lung Mass on 10/31. Patient was not on Oxygen prior to procedure. Currently on 4L O2. Patient is from home with her son, [name] [name]. CM called and spoke with [name] [name] via phone [phone]). Patient is currently on disability, uses IT trainer to and from outpatient Dialysis. Has a walker, electric wheelchair, Nebulizer machine and bedside commode. Has a Private Aide from [place] 7 days/week, 2-3 hrs/day.   No TOC needs noted at this time. Patient might need home O2 at discharge. CM will plan accordingly as patient progresses with care towards discharge.    Expected Discharge Plan: Home/Self Care Barriers to Discharge: Continued Medical Work up   Patient Goals and CMS Choice Patient states their goals for this hospitalization and ongoing recovery are:: To return home CMS Medicare.gov Compare Post Acute Care list provided to:: Patient    Expected Discharge Plan and Services Expected Discharge Plan: Home/Self Care   Discharge Planning Services: CM Consult   Living arrangements for the past 2 months: Apartment                                      Prior Living Arrangements/Services Living arrangements for the past 2 months: Apartment Lives with:: Adult Children (Son, [name] [name]) Patient language and need for interpreter reviewed:: Yes Do you feel safe going back to the place where you live?: Yes      Need for  Family Participation in Patient Care: Yes (Comment) Care giver support system in place?: Yes (comment) Current home services: DME Chief [employer] Officer wheelchair, BSC, walker.) Criminal Activity/Legal Involvement Pertinent to Current Situation/Hospitalization: No - Comment as needed  Activities of Daily Living      Permission Sought/Granted Permission sought to share information with : Case Manager, Family Supports Permission granted to share information with : Yes, Verbal Permission Granted              Emotional Assessment Appearance:: Appears stated age Attitude/Demeanor/Rapport: Engaged, Gracious Affect (typically observed): Accepting, Calm, Hopeful, Pleasant Orientation: : Oriented to Self, Oriented to Place, Oriented to Situation Alcohol / Substance Use: Not Applicable Psych Involvement: No (comment)  Admission diagnosis:  Acute respiratory failure ([place]) [J96.00] Patient Active Problem List   Diagnosis Date Noted   Acute respiratory failure ([place]) 08/07/2022   Respiratory failure, post-operative ([place]) 08/07/2022   Pulmonary edema 08/07/2022   Acute on chronic respiratory failure with hypoxia and hypercapnia ([place]) 08/07/2022   Lung mass 07/30/2022   Lung cancer, lower lobe ([place]) 01/31/2022   Pressure injury of skin 12/06/2021   Malnutrition of moderate degree 12/01/2021   ESRD (end stage renal disease) on dialysis ([place])    Lung nodules    Advanced care planning/counseling discussion    Goals of care, counseling/discussion    Acute respiratory failure with hypoxia and hypercarbia ([place])  Labored breathing    Kidney lesion, native, left 11/24/2021   CKD (chronic kidney disease) stage 5, GFR less than 15 ml/min (HCC) 11/23/2021   Syncope 11/21/2021   Chronic diastolic (congestive) heart failure (HCC) 07/01/2021   Chronic pain syndrome 07/01/2021   COPD exacerbation ([place]) 07/01/2021   Cellulitis of leg, right 07/01/2021   SARS-CoV-2 positive 02/21/2021   Increased weakness  when ambulating 02/17/2021   Intractable nausea and vomiting 02/15/2021   Symptomatic anemia 01/24/2021   Anemia, unspecified 01/08/2020   Bacteremia 01/07/2020   UTI (urinary tract infection) 01/06/2020   Anemia 10/26/2019   S/P lumbar fusion 09/18/2019   Chronic CHF (congestive heart failure) ([place]) 03/16/2019   Leukocytosis 03/16/2019   Chest pain 11/27/2018   Pre-syncope 11/27/2018   Epigastric abdominal pain 11/27/2018   Solitary pulmonary nodule 11/06/2018   Hypertensive heart and renal disease, stage 1-4 or unspecified chronic kidney disease, with heart failure ([place]) 10/20/2018   CHF (congestive heart failure), NYHA class III, acute on chronic, systolic ([place]) 29/92/4268   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, unspecified extremity ([place]) 04/19/2016   Cellulitis of abdominal wall 03/21/2015   Acute on chronic renal failure ([place]) 03/21/2015   DM (diabetes mellitus), type 2 with renal complications ([place]) 34/19/6222   Abdominal wall abscess 03/21/2015   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   Low urine output 08/11/2014   Acute on chronic systolic CHF (congestive heart failure), NYHA class 3 ([place]) 08/11/2014   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   Hypothyroidism 08/11/2014   OSA (obstructive sleep apnea) 08/11/2014   Positive cardiac stress test 06/29/2014   Hyperlipidemia 02/12/2014   Left arm weakness 12/23/2013   TIA (transient ischemic attack) 12/23/2013   Left buttock abscess 10/12/2013   Cellulitis and abscess of leg, right 04/16/2013   Acute blood loss anemia 02/14/2013   HTN (hypertension) 02/14/2013   Cellulitis and abscess of buttock 02/09/2013   Class 2 obesity due to excess calories with body mass index (BMI) of 36.0 to 36.9 in adult 02/03/2013   Diarrhea 02/03/2013   Hyponatremia 02/03/2013   Sepsis due to undetermined organism ([place]) 02/02/2013   Diabetes mellitus without complication ([place]) 97/98/9211   CKD (chronic kidney disease),  stage IV ([place]) 02/02/2013   Cellulitis 02/02/2013   PAD (peripheral artery disease) ([place]) 04/26/2011   Tobacco abuse 04/26/2011   PCP:  Antony Blackbird, MD Pharmacy:   [facility] [place], [address] AT [address] [place] [address] Phone: [phone] Fax: [phone]     Social Determinants of Health (SDOH) Interventions    Readmission Risk Interventions    01/14/2020   12:01 PM  Readmission Risk Prevention Plan  Transportation Screening Complete  Medication Review (RN Care Manager) Complete  PCP or Specialist appointment within 3-5 days of discharge Complete  HRI or Home Care Consult Complete  SW Recovery Care/Counseling Consult Complete  Palliative Care Screening Not Applicable  Skilled Nursing Facility Complete

## 2022-08-09 ENCOUNTER — Other Ambulatory Visit (HOSPITAL_COMMUNITY): Payer: 59

## 2022-08-09 LAB — HEPATITIS B SURFACE ANTIBODY, QUANTITATIVE: Hep B S AB Quant (Post): <3.1 m[IU]/mL — ABNORMAL LOW (ref >9.9)

## 2022-08-10 ENCOUNTER — Ambulatory Visit (HOSPITAL_COMMUNITY): Payer: 59

## 2022-08-10 LAB — CYTOLOGY - NON PAP

## 2022-08-16 ENCOUNTER — Other Ambulatory Visit: Payer: Self-pay | Admitting: *Deleted

## 2022-08-16 NOTE — Progress Notes (Signed)
The proposed treatment discussed in conference is for discussion purpose only and is not a binding recommendation.  The patients have not been physically examined, or presented with their treatment options.  Therefore, final treatment plans cannot be decided.  

## 2022-08-22 ENCOUNTER — Inpatient Hospital Stay: Payer: 59 | Admitting: Pulmonary Disease

## 2022-08-22 ENCOUNTER — Ambulatory Visit: Payer: 59 | Admitting: Pulmonary Disease

## 2022-08-24 ENCOUNTER — Ambulatory Visit: Payer: 59

## 2022-08-24 ENCOUNTER — Ambulatory Visit: Payer: 59 | Admitting: Acute Care

## 2022-08-24 ENCOUNTER — Ambulatory Visit
Admission: RE | Admit: 2022-08-24 | Discharge: 2022-08-24 | Disposition: A | Discharge status: Home / Self Care | Payer: 59 | Source: Ambulatory Visit | Attending: Infectious Diseases | Admitting: Infectious Diseases

## 2022-08-24 DIAGNOSIS — R928 Other abnormal and inconclusive findings on diagnostic imaging of breast: Secondary | ICD-10-CM

## 2022-08-25 NOTE — Progress Notes (Signed)
Looks like she cancelled her office follow up appt. Please let her know that she needs to be seen in follow up with Dr. Julien Nordmann. She has recurrent cancer from her recent lung biopsy   Thanks,  BLI  Garner Nash, DO  Pulmonary Critical Care 08/25/2022 5:33 PM

## 2022-08-27 ENCOUNTER — Telehealth: Payer: Self-pay | Admitting: Radiation Oncology

## 2022-08-27 NOTE — Telephone Encounter (Signed)
I called the patient to follow up. 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 Plans were to proceed with SBRT to these two LLL nodules. I let her know that this would still be the recommendation, and that our staff would call her to schedule simulation and asked her to call back if she has questions or concerns.

## 2022-08-29 ENCOUNTER — Other Ambulatory Visit (HOSPITAL_BASED_OUTPATIENT_CLINIC_OR_DEPARTMENT_OTHER): Payer: 59

## 2022-09-03 ENCOUNTER — Telehealth: Payer: Self-pay

## 2022-09-03 NOTE — Telephone Encounter (Signed)
This nurse spoke with patient to verify that she is aware that her follow up with Dr. Worthy Flank office is in February.  She understands that she will be meeting with Dr.  Lisbeth Renshaw on 11/29 and the plan is for radiation treatments.  Patient has no further questions or concerns at this time.

## 2022-09-05 ENCOUNTER — Other Ambulatory Visit (HOSPITAL_COMMUNITY): Payer: 59

## 2022-09-05 ENCOUNTER — Other Ambulatory Visit: Payer: Self-pay

## 2022-09-05 ENCOUNTER — Ambulatory Visit
Admission: RE | Admit: 2022-09-05 | Discharge: 2022-09-05 | Disposition: A | Discharge status: Home / Self Care | Payer: 59 | Source: Ambulatory Visit | Attending: Radiation Oncology | Admitting: Radiation Oncology

## 2022-09-05 DIAGNOSIS — C3432 Malignant neoplasm of lower lobe, left bronchus or lung: Secondary | ICD-10-CM | POA: Diagnosis not present

## 2022-09-05 DIAGNOSIS — Z510 Encounter for antineoplastic radiation therapy: Secondary | ICD-10-CM | POA: Insufficient documentation

## 2022-09-06 ENCOUNTER — Other Ambulatory Visit: Payer: Self-pay | Admitting: Radiation Oncology

## 2022-09-06 DIAGNOSIS — R053 Chronic cough: Secondary | ICD-10-CM

## 2022-09-06 DIAGNOSIS — C3432 Malignant neoplasm of lower lobe, left bronchus or lung: Secondary | ICD-10-CM

## 2022-09-06 DIAGNOSIS — J3089 Other allergic rhinitis: Secondary | ICD-10-CM

## 2022-09-06 NOTE — Progress Notes (Signed)
The patient was seen today to consent for SBRT to the left lung. She has been having persistent trouble with post nasal drip and chronic cough with occasional blood in her sputum. She states she has had this prior to her biopsy, and since, and states that pulmonary feels that this is sinus allergy related. She reports she continues to use flonase and zyrtec daily without improvement. I offered her a referral to ENT for formal evaluation. She is in agreement.

## 2022-09-07 ENCOUNTER — Ambulatory Visit (HOSPITAL_BASED_OUTPATIENT_CLINIC_OR_DEPARTMENT_OTHER): Payer: 59

## 2022-09-11 ENCOUNTER — Encounter (HOSPITAL_COMMUNITY): Payer: Self-pay

## 2022-09-18 ENCOUNTER — Ambulatory Visit: Payer: 59 | Admitting: Radiation Oncology

## 2022-09-18 DIAGNOSIS — C3432 Malignant neoplasm of lower lobe, left bronchus or lung: Secondary | ICD-10-CM | POA: Insufficient documentation

## 2022-09-18 DIAGNOSIS — Z510 Encounter for antineoplastic radiation therapy: Secondary | ICD-10-CM | POA: Diagnosis not present

## 2022-09-19 ENCOUNTER — Ambulatory Visit: Payer: 59 | Admitting: Radiation Oncology

## 2022-09-20 ENCOUNTER — Ambulatory Visit: Payer: 59 | Admitting: Radiation Oncology

## 2022-09-21 ENCOUNTER — Ambulatory Visit: Payer: 59 | Admitting: Radiation Oncology

## 2022-09-21 ENCOUNTER — Ambulatory Visit
Admission: RE | Admit: 2022-09-21 | Discharge: 2022-09-21 | Disposition: A | Discharge status: Home / Self Care | Payer: 59 | Source: Ambulatory Visit | Attending: Radiation Oncology | Admitting: Radiation Oncology

## 2022-09-21 ENCOUNTER — Other Ambulatory Visit: Payer: Self-pay

## 2022-09-21 ENCOUNTER — Ambulatory Visit: Payer: 59

## 2022-09-21 DIAGNOSIS — C3432 Malignant neoplasm of lower lobe, left bronchus or lung: Secondary | ICD-10-CM | POA: Diagnosis not present

## 2022-09-21 LAB — RAD ONC ARIA SESSION SUMMARY
Course Elapsed Days: 0
Plan Fractions Treated to Date: 1
Plan Prescribed Dose Per Fraction: 18 Gy
Plan Total Fractions Prescribed: 3
Plan Total Prescribed Dose: 54 Gy
Reference Point Dosage Given to Date: 18 Gy
Reference Point Session Dosage Given: 17.9349 Gy
Session Number: 1

## 2022-09-24 ENCOUNTER — Other Ambulatory Visit: Payer: Self-pay

## 2022-09-24 ENCOUNTER — Ambulatory Visit: Payer: 59 | Admitting: Radiation Oncology

## 2022-09-24 ENCOUNTER — Ambulatory Visit
Admission: RE | Admit: 2022-09-24 | Discharge: 2022-09-24 | Disposition: A | Discharge status: Home / Self Care | Payer: 59 | Source: Ambulatory Visit | Attending: Radiation Oncology | Admitting: Radiation Oncology

## 2022-09-24 DIAGNOSIS — C3432 Malignant neoplasm of lower lobe, left bronchus or lung: Secondary | ICD-10-CM | POA: Diagnosis not present

## 2022-09-24 LAB — RAD ONC ARIA SESSION SUMMARY
Course Elapsed Days: 3
Plan Fractions Treated to Date: 2
Plan Prescribed Dose Per Fraction: 18 Gy
Plan Total Fractions Prescribed: 3
Plan Total Prescribed Dose: 54 Gy
Reference Point Dosage Given to Date: 36 Gy
Reference Point Session Dosage Given: 18 Gy
Session Number: 2

## 2022-09-25 ENCOUNTER — Ambulatory Visit: Payer: 59 | Admitting: Radiation Oncology

## 2022-09-26 ENCOUNTER — Other Ambulatory Visit: Payer: Self-pay

## 2022-09-26 ENCOUNTER — Encounter: Payer: Self-pay | Admitting: Radiation Oncology

## 2022-09-26 ENCOUNTER — Ambulatory Visit: Payer: 59 | Admitting: Radiation Oncology

## 2022-09-26 ENCOUNTER — Ambulatory Visit
Admission: RE | Admit: 2022-09-26 | Discharge: 2022-09-26 | Disposition: A | Discharge status: Home / Self Care | Payer: 59 | Source: Ambulatory Visit | Attending: Radiation Oncology | Admitting: Radiation Oncology

## 2022-09-26 ENCOUNTER — Ambulatory Visit: Payer: 59

## 2022-09-26 DIAGNOSIS — C3432 Malignant neoplasm of lower lobe, left bronchus or lung: Secondary | ICD-10-CM | POA: Diagnosis not present

## 2022-09-26 LAB — RAD ONC ARIA SESSION SUMMARY
Course Elapsed Days: 5
Plan Fractions Treated to Date: 3
Plan Prescribed Dose Per Fraction: 18 Gy
Plan Total Fractions Prescribed: 3
Plan Total Prescribed Dose: 54 Gy
Reference Point Dosage Given to Date: 54 Gy
Reference Point Session Dosage Given: 18 Gy
Session Number: 3

## 2022-09-27 ENCOUNTER — Ambulatory Visit: Payer: 59 | Admitting: Radiation Oncology

## 2022-09-27 ENCOUNTER — Encounter (HOSPITAL_COMMUNITY): Payer: Self-pay

## 2022-09-27 NOTE — Progress Notes (Signed)
                                                                                                                                                             Patient Name: [name] [name] MRN: [id number] DOB: 12-Apr-1951 Referring Physician: Curt Bears (Profile Not Attached) Date of Service: 09/26/2022  [facility] [place]                                                        End Of Treatment Note  Diagnoses: C34.32-Malignant neoplasm of lower lobe, left bronchus or lung  Cancer Staging: Recurrent Stage IA3, cT1cN0M0, NSCLC, squamous cell carcinoma of the LLL  Intent: Curative  Radiation Treatment Dates:   09/21/2022 through 09/26/2022 SBRT Treatment Site Technique Total Dose (Gy) Dose per Fx (Gy) Completed Fx Beam Energies  Lung, Left:  2 LLL nodules treated in one isocenter IMRT 54/54 18 3/3 [id number]   Narrative: The patient tolerated radiation therapy relatively well. She did have sinus congestion and desired ENT evaluation and was referred to ENT.   Plan: The patient will receive a call in about one month from the radiation oncology department. She will continue follow up with Dr. Julien Nordmann as well.   ________________________________________________    Carola Rhine, [facility]

## 2022-09-28 ENCOUNTER — Inpatient Hospital Stay: Payer: 59 | Admitting: Pulmonary Disease

## 2022-09-28 ENCOUNTER — Ambulatory Visit: Payer: 59 | Admitting: Radiation Oncology

## 2022-09-28 NOTE — Progress Notes (Deleted)
Synopsis: Referred in March 2023 for lung nodule by Antony Blackbird, MD  Subjective:   PATIENT ID: [name] [name] GENDER: female DOB: 01-Feb-1951, MRN: [id number]  No chief complaint on file.   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  Previously followed left lower lobe pulmonary nodule that was first identified in 2020.  Recent had a hospital admission in February 2023.  Repeat CT scan of the chest at 2.8 x 1.8 cm left lower lobe pulmonary nodule concerning for primary lung cancer.  She does have continued weight loss.  She does not have hemoptysis.  I saw her in the hospital in consultation with recommendations for a PET scan.  Patient had the nuclear medicine PET scan after she was discharged from the hospital.  CT PET scan was completed on 12/29/2021 which revealed a hypermetabolic 3 cm left lower lobe mass with an SUV max of 11.2 concerning for malignancy.  OV 07/30/2022: this is a 71 year old seen for evaluation after PET scan.  She had a PET scan restaging with Dr. Earlie Server.  She was diagnosed with a left lower lobe malignancy in the spring.  She had a repeat PET scan that now shows 2 nodules that are hypermetabolic in the lower lobe concerning for recurrence of malignancy.  Patient was referred back for consideration for repeat biopsy.There is now 2 adjacent intensely hypermetabolic nodules from the left lower lobe position concerning for intralobar metastasis.Initially patient underwent radiation treatments in June with Dr. Lisbeth Renshaw for a stage Ia, T1c, N0, M0 non-small cell lung cancer.  Dr. Earlie Server now requesting repeat bronchoscopy and biopsy of the 2 new nodules as they appear to be recurrence.    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   Full dentures    GERD (gastroesophageal reflux disease)    Gout    History of hiatal hernia    History of transfusion    Hx MRSA infection    abscess left groin   Hyperlipidemia    Hypertension    Hypothyroid    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HCC)    Peripheral vascular disease (HCC)    Sleep apnea    UNABLE TO TOLERATE C PAP   Stress incontinence    TIA (transient ischemic attack)     X2 NO RESIDUAL PROBLEMS   Wears glasses      Family History  Problem Relation Age of Onset   Diabetes Brother    Cardiomyopathy Mother    Heart disease Mother    Cancer Father      Past Surgical History:  Procedure Laterality Date   ABDOMINAL HYSTERECTOMY     AV FISTULA PLACEMENT Left 12/01/2021   Procedure: INSERTION OF LEFT ARM ARTERIOVENOUS (AV) GORE-TEX GRAFT;  Surgeon: Serafina Mitchell, MD;  Location: [facility] OR;  Service: Vascular;  Laterality: Left;   BACK SURGERY  4098   BASCILIC VEIN TRANSPOSITION Left 04/05/2020  Procedure: BASILIC VEIN TRANSPOSITION FIRST STAGE;  Surgeon: Chuck Hint, MD;  Location: [facility] OR;  Service: Vascular;  Laterality: Left;   BRONCHIAL BIOPSY  01/23/2022   Procedure: BRONCHIAL BIOPSIES;  Surgeon: Josephine Igo, DO;  Location: [facility]COPY;  Service: Pulmonary;;   BRONCHIAL BIOPSY  08/07/2022   Procedure: BRONCHIAL BIOPSIES;  Surgeon: Josephine Igo, DO;  Location: [facility];  Service: Pulmonary;;   BRONCHIAL BRUSHINGS  08/07/2022   Procedure: BRONCHIAL BRUSHINGS;  Surgeon: Josephine Igo, DO;  [facility]: [facility];  Service: Pulmonary;;   BRONCHIAL NEEDLE ASPIRATION BIOPSY  01/23/2022   Procedure: BRONCHIAL NEEDLE ASPIRATION BIOPSIES;   Surgeon: Josephine Igo, DO;  [facility]: [facility];  Service: Pulmonary;;   BRONCHIAL NEEDLE ASPIRATION BIOPSY  08/07/2022   Procedure: BRONCHIAL NEEDLE ASPIRATION BIOPSIES;  Surgeon: Josephine Igo, DO;  [facility]: [facility];  Service: Pulmonary;;   COLON SURGERY  1995   DUE TO POLYP   DILATION AND CURETTAGE OF UTERUS     ENDOBRONCHIAL ULTRASOUND  01/23/2022   Procedure: ENDOBRONCHIAL ULTRASOUND;  Surgeon: Josephine Igo, DO;  [facility]: [facility];  Service: Pulmonary;;   ENTEROSCOPY N/A 07/10/2021   Procedure: ENTEROSCOPY;  Surgeon: Kerin Salen, MD;  Location: [facility] ENDOSCOPY;  Service: Gastroenterology;  Laterality: N/A;   ESOPHAGOGASTRODUODENOSCOPY N/A 01/25/2021   Procedure: ESOPHAGOGASTRODUODENOSCOPY (EGD);  Surgeon: Vida Rigger, MD;  Location: [facility] END[facility]COPY;  Service: Endoscopy;  Laterality: N/A;   FINE NEEDLE ASPIRATION  01/23/2022   Procedure: FINE NEEDLE ASPIRATION (FNA) LINEAR;  Surgeon: Josephine Igo, DO;  [facility];  Service: Pulmonary;;   FOREARM FRACTURE SURGERY     Left arm   GIVENS CAPSULE STUDY N/A 07/07/2021   Procedure: GIVENS CAPSULE STUDY;  Surgeon: Vida Rigger, MD;  Location: [facility] ENDOSCOPY;  Service: Endoscopy;  Laterality: N/A;   HERNIA REPAIR     w/ mesh   HOT HEMOSTASIS N/A 07/10/2021   Procedure: HOT HEMOSTASIS (ARGON PLASMA COAGULATION/BICAP);  Surgeon: Kerin Salen, MD;  Location: [facility] ENDOSCOPY;  Service: Gastroenterology;  Laterality: N/A;   INCISION AND DRAINAGE ABSCESS Left 02/04/2013   Procedure: INCISION AND DRAINAGE LEFT BUTTOCK ABSCESS; INCISION AND DRAINAGE LEFT BREAST ABSCESS;  Surgeon: Shelly Rubenstein, MD;  Location: [facility];  Service: General;  Laterality: Left;   INCISION AND DRAINAGE ABSCESS N/A 02/12/2013   Procedure: INCISION AND DEBRIDEMENT BUTTOCK WOUND ;  Surgeon: Wilmon Arms. Corliss Skains, MD;  Location: [facility] OR;  Service: General;  Laterality: N/A;   INCISION AND DRAINAGE ABSCESS N/A 02/14/2013   Procedure: INCISION AND DRAINAGE/DRESSING  CHANGE;  Surgeon: Shelly Rubenstein, MD;  [facility]: [facility];  Service: General;  Laterality: N/A;   INCISION AND DRAINAGE ABSCESS N/A 03/21/2015   Procedure: INCISION AND DRAINAGE PUBIC ABSCESS;  Surgeon: Glenna Fellows, MD;  Location: [facility] ORS;  Service: General;  Laterality: N/A;   INCISION AND DRAINAGE PERIRECTAL ABSCESS Left 02/10/2013   Procedure: IRRIGATION AND DEBRIDEMENT OF BUTTOCK/PERINEAL ABSCESS;  Surgeon: Wilmon Arms. Corliss Skains, MD;  Location: [facility];  Service: General;  Laterality: Left;   INCISION AND DRAINAGE PERIRECTAL ABSCESS N/A 02/16/2013   Procedure: IRRIGATION AND DEBRIDEMENT PERINEAL ABSCESS;  Surgeon: Liz Malady, MD;  [facility]: [facility];  Service: General;  Laterality: N/A;   IR FLUORO GUIDE CV LINE RIGHT  11/30/2021   IR US GUIDE VASC ACCESS RIGHT  11/30/2021   IRRIGATION AND DEBRIDEMENT ABSCESS N/A 02/06/2013   Procedure: IRRIGATION AND DEBRIDEMENT BUTTOCK ABSCESS AND DRESSING CHANGE;  Surgeon: Shelly Rubenstein, MD;  Location: [facility]  OR;  Service: General;  Laterality: N/A;   IRRIGATION AND DEBRIDEMENT ABSCESS Left 02/08/2013   Procedure: IRRIGATION AND DEBRIDEMENT ABSCESS/DRESSING CHANGE;  Surgeon: Cherylynn Ridges, MD;  Location: [facility] OR;  Service: General;  Laterality: Left;   JOINT REPLACEMENT  2010 / 2012    LAPAROSCOPIC CHOLECYSTECTOMY     LEFT HEART CATHETERIZATION WITH CORONARY ANGIOGRAM N/A 07/02/2014   Procedure: LEFT HEART CATHETERIZATION WITH CORONARY ANGIOGRAM;  Surgeon: Runell Gess, MD;  Location: [facility] CATH LAB;  Service: Cardiovascular;  Laterality: N/A;   LOWER EXTREMITY ANGIOGRAM Right 04/20/2016   Procedure: Lower Extremity Angiogram;  Surgeon: Sherren Kerns, MD;  Location: [facility] INVASIVE CV LAB;  Service: Cardiovascular;  Laterality: Right;   MULTIPLE TOOTH EXTRACTIONS     PERIPHERAL VASCULAR CATHETERIZATION N/A 04/20/2016   Procedure: Abdominal Aortogram;  Surgeon: Sherren Kerns, MD;  Location: [facility] INVASIVE CV LAB;  Service: Cardiovascular;  Laterality: N/A;    PERIPHERAL VASCULAR CATHETERIZATION Right 04/20/2016   Procedure: Peripheral Vascular Intervention;  Surgeon: Sherren Kerns, MD;  Location: [facility] INVASIVE CV LAB;  Service: Cardiovascular;  Laterality: Right;  popiteal   RIGHT HEART CATH N/A 10/27/2018   Procedure: RIGHT HEART CATH;  Surgeon: Laurey Morale, MD;  Location: [facility] INVASIVE CV LAB;  Service: Cardiovascular;  Laterality: N/A;   RIGHT HEART CATH N/A 03/20/2019   Procedure: RIGHT HEART CATH;  Surgeon: Laurey Morale, MD;  Location: [facility] INVASIVE CV LAB;  Service: Cardiovascular;  Laterality: N/A;   THYROIDECTOMY     TOTAL HIP REVISION Right 08/11/2014   Procedure: RIGHT ACETABULAR REVISION;  Surgeon: Loanne Drilling, MD;  Location: [facility] ORS;  Service: Orthopedics;  Laterality: Right;   VASCULAR SURGERY     VIDEO BRONCHOSCOPY WITH ENDOBRONCHIAL ULTRASOUND Bilateral 08/07/2022   Procedure: VIDEO BRONCHOSCOPY WITH ENDOBRONCHIAL ULTRASOUND;  Surgeon: Josephine Igo, DO;  Location: [facility];  Service: Pulmonary;  Laterality: Bilateral;   VIDEO BRONCHOSCOPY WITH RADIAL ENDOBRONCHIAL ULTRASOUND  01/23/2022   Procedure: RADIAL ENDOBRONCHIAL ULTRASOUND;  Surgeon: Josephine Igo, DO;  Location: [facility] ENDOS[facility];  Service: Pulmonary;;    Social History   Socioeconomic History   Marital status: Legally Separated    Spouse name: Not on file   Number of children: 1   Years of education: college   Highest education level: Not on file  Occupational History    Comment: Disabled  Tobacco Use   Smoking status: Former    Packs/day: 0.25    Years: 40.00    Total pack years: 10.00    Types: Cigarettes    Quit date: 10/2021    Years since quitting: 0.9    Passive exposure: Past   Smokeless tobacco: Former  Building services engineer Use: Never used  Substance and Sexual Activity   Alcohol use: Yes    Alcohol/week: 1.0 standard drink of alcohol    Types: 1 Standard drinks or equivalent per week    Comment: OCC   Drug use: No   Sexual activity:  Not Currently    Birth control/protection: Surgical    Comment: Hysterectomy  Other Topics Concern   Not on file  Social History Narrative   ** Merged History Encounter **       Patient is separated  and lives at home with her friend. Patient is disabled. Education one year of college Right handed Caffeine two cups daily.     Social Determinants of Health   Financial Resource Strain: Not on file  Food Insecurity: Not on  file  Transportation Needs: Not on file  Physical Activity: Not on file  Stress: Not on file  Social Connections: Not on file  Intimate Partner Violence: Not At Risk (03/07/2022)   Humiliation, Afraid, Rape, and Kick questionnaire    Fear of Current or Ex-Partner: No    Emotionally Abused: No    Physically Abused: No    Sexually Abused: No     Allergies  Allergen Reactions   Nebivolol Swelling    Chest pain (reaction to Bystolic)   Zestril [Lisinopril] Swelling and Other (See Comments)   Ace Inhibitors Swelling and Other (See Comments)    Tongue swell   Morphine And Related Itching     Outpatient Medications Prior to Visit  Medication Sig Dispense Refill   acetaminophen (TYLENOL) 500 MG tablet Take 1,000 mg by mouth 3 (three) times daily.     albuterol (PROVENTIL) (2.5 MG/3ML) 0.083% nebulizer solution Take 3 mLs (2.5 mg total) by nebulization every 6 (six) hours as needed for wheezing or shortness of breath. 75 mL 12   albuterol (VENTOLIN HFA) 108 (90 Base) MCG/ACT inhaler Inhale 2 puffs into the lungs every 6 (six) hours as needed for shortness of breath (COPD).     allopurinol (ZYLOPRIM) 100 MG tablet Take 1 tablet (100 mg total) by mouth daily. 30 tablet 0   budesonide-formoterol (SYMBICORT) 160-4.5 MCG/ACT inhaler Inhale 2 puffs into the lungs in the morning and at bedtime.     carboxymethylcellulose (REFRESH PLUS) 0.5 % SOLN Place 1 drop into both eyes 3 (three) times daily as needed (dry/irritated eyes.).     carvedilol (COREG) 12.5 MG tablet  Take 1 tablet (12.5 mg total) by mouth 2 (two) times daily. 60 tablet 0   cetirizine (ZYRTEC) 10 MG tablet Take 10 mg by mouth 2 (two) times daily.     clopidogrel (PLAVIX) 75 MG tablet Take 75 mg by mouth daily.     fluticasone (FLONASE) 50 MCG/ACT nasal spray Place 1 spray into both nostrils daily.     Insulin Lispro Prot & Lispro (HUMALOG 75/25 MIX) (75-25) 100 UNIT/ML Kwikpen Inject 10-20 Units into the skin See admin instructions. Take 20 units is the morning and 10 units at bedtime     levothyroxine (SYNTHROID) 200 MCG tablet Take 1 tablet (200 mcg total) by mouth daily at 6 (six) AM. 30 tablet 0   lidocaine (LIDODERM) 5 % 2 patches every 12 (twelve) hours. 12 hours off 1 patch Each side of lower back     lidocaine-prilocaine (EMLA) cream SMARTSIG:sparingly Topical 3 Times a Week     magnesium oxide (MAG-OX) 400 (241.3 Mg) MG tablet Take 1 tablet (400 mg total) by mouth 2 (two) times daily. 30 tablet 0   Multiple Vitamins-Iron (MULTIVITAMINS WITH IRON) TABS tablet Take 1 tablet by mouth in the morning.     pantoprazole (PROTONIX) 40 MG tablet Take 1 tablet (40 mg total) by mouth 2 (two) times daily before a meal.     sevelamer carbonate (RENVELA) 800 MG tablet Take 1,600 mg by mouth 3 (three) times daily with meals.     thiamine (VITAMIN B-1) 100 MG tablet Take 100 mg by mouth in the morning.     No facility-administered medications prior to visit.    Review of Systems  Constitutional:  Negative for chills, fever, malaise/fatigue and weight loss.  HENT:  Negative for hearing loss, sore throat and tinnitus.   Eyes:  Negative for blurred vision and double vision.  Respiratory:  Positive  for cough, hemoptysis and shortness of breath. Negative for sputum production, wheezing and stridor.   Cardiovascular:  Negative for chest pain, palpitations, orthopnea, leg swelling and PND.  Gastrointestinal:  Negative for abdominal pain, constipation, diarrhea, heartburn, nausea and vomiting.   Genitourinary:  Negative for dysuria, hematuria and urgency.  Musculoskeletal:  Negative for joint pain and myalgias.  Skin:  Negative for itching and rash.  Neurological:  Negative for dizziness, tingling, weakness and headaches.  Endo/Heme/Allergies:  Negative for environmental allergies. Does not bruise/bleed easily.  Psychiatric/Behavioral:  Negative for depression. The patient is not nervous/anxious and does not have insomnia.   All other systems reviewed and are negative.    Objective:  Physical Exam Vitals reviewed.  Constitutional:      General: She is not in acute distress.    Appearance: She is well-developed. She is obese.  HENT:     Head: Normocephalic and atraumatic.  Eyes:     General: No scleral icterus.    Conjunctiva/sclera: Conjunctivae normal.     Pupils: Pupils are equal, round, and reactive to light.  Neck:     Vascular: No JVD.     Trachea: No tracheal deviation.  Cardiovascular:     Rate and Rhythm: Normal rate and regular rhythm.     Heart sounds: Normal heart sounds. No murmur heard. Pulmonary:     Effort: Pulmonary effort is normal. No tachypnea, accessory muscle usage or respiratory distress.     Breath sounds: No stridor. No wheezing, rhonchi or rales.  Abdominal:     General: There is no distension.     Palpations: Abdomen is soft.     Tenderness: There is no abdominal tenderness.  Musculoskeletal:        General: No tenderness.     Cervical back: Neck supple.  Lymphadenopathy:     Cervical: No cervical adenopathy.  Skin:    General: Skin is warm and dry.     Capillary Refill: Capillary refill takes less than 2 seconds.     Findings: No rash.  Neurological:     Mental Status: She is alert and oriented to person, place, and time.  Psychiatric:        Behavior: Behavior normal.      There were no vitals filed for this visit.    on RA BMI Readings from Last 3 Encounters:  08/08/22 40.77 kg/m  07/30/22 36.14 kg/m  07/18/22 40.03  kg/m   Wt Readings from Last 3 Encounters:  08/08/22 230 lb 2.6 oz (104.4 kg)  07/30/22 204 lb (92.5 kg)  07/18/22 226 lb (102.5 kg)     CBC    Component Value Date/Time   WBC 9.0 08/08/2022 0130   RBC 3.45 (L) 08/08/2022 0130   HGB 10.3 (L) 08/08/2022 0130   HGB 10.3 (L) 06/25/2022 0955   HCT 35.2 (L) 08/08/2022 0130   HCT 24.6 (L) 10/17/2020 1235   PLT 215 08/08/2022 0130   PLT 227 06/25/2022 0955   MCV 102.0 (H) 08/08/2022 0130   MCH 29.9 08/08/2022 0130   MCHC 29.3 (L) 08/08/2022 0130   RDW 16.0 (H) 08/08/2022 0130   RDW 15.7 (H) 12/23/2013 1132   LYMPHSABS 1.0 08/07/2022 1822   LYMPHSABS 2.5 12/23/2013 1132   MONOABS 0.3 08/07/2022 1822   EOSABS 0.0 08/07/2022 1822   EOSABS 0.1 12/23/2013 1132   BASOSABS 0.0 08/07/2022 1822   BASOSABS 0.0 12/23/2013 1132      Chest Imaging: Nuclear medicine pet imaging March 2023: Hypermetabolic left  lower lobe lung mass concerning for primary malignancy. The patient's images have been independently reviewed by me.    October 2023 nuclear medicine pet imaging: 2 hypermetabolic nodules within the left lower concern lobe concerning for recurrence of malignancy. The patient's images have been independently reviewed by me.    Pulmonary Functions Testing Results:    Latest Ref Rng & Units 05/08/2022   11:50 AM  PFT Results  FVC-Pre L 1.52   FVC-Predicted Pre % 53   FVC-Post L 1.48   FVC-Predicted Post % 52   Pre FEV1/FVC % % 77   Post FEV1/FCV % % 80   FEV1-Pre L 1.17   FEV1-Predicted Pre % 54   FEV1-Post L 1.19   DLCO uncorrected ml/min/mmHg 9.41   DLCO UNC% % 50   DLCO corrected ml/min/mmHg 9.41   DLCO COR %Predicted % 50   DLVA Predicted % 65   TLC L 3.74   TLC % Predicted % 76   RV % Predicted % 97     FeNO:   Pathology:   Echocardiogram:   Heart Catheterization:     Assessment & Plan:   No diagnosis found.   Discussion:  This is a 71 year old obese female, former smoker, prior one quarter of a  pack a day for 40 years.  Initially had a incidental pulmonary nodule found in 2020.  No subsequent follow-up completed at that time.  She had a repeat CT chest in February 2023 which was found during her hospitalization that showed enlargement of left lower lobe nodule.  PET scan imaging showed hypermetabolic uptake with an SUV of 11.  Patient was taken for robotic assisted navigational bronchoscopy and tissue sampling which confirmed non-small cell lung cancer left lower lobe status post radiation treatments with Dr. Lisbeth Renshaw at this time.  Repeat pet imaging reveals 2 new nodules in the left lung concerning for recurrence of disease and patient was referred back for repeat bronchoscopy.  Plan: Today in the office we talked about the risk benefits alternatives of proceeding with robotic assisted bronchoscopy and tissue biopsy to include bleeding and pneumothorax. Patient is agreeable to proceed with this plan. Tentative bronchoscopy date would be 08/07/2022. Patient is on Plavix. Patient's last dose Plavix will be tomorrow. She will need to rearrange her dialysis schedule. She will get dialysis on Monday the 30th. She will call her dialysis unit and get this arranged. She will need an appointment to see SG, NP 1 week after bronchoscopy complete.  We have PCC's help with scheduling.   Current Outpatient Medications:    acetaminophen (TYLENOL) 500 MG tablet, Take 1,000 mg by mouth 3 (three) times daily., Disp: , Rfl:    albuterol (PROVENTIL) (2.5 MG/3ML) 0.083% nebulizer solution, Take 3 mLs (2.5 mg total) by nebulization every 6 (six) hours as needed for wheezing or shortness of breath., Disp: 75 mL, Rfl: 12   albuterol (VENTOLIN HFA) 108 (90 Base) MCG/ACT inhaler, Inhale 2 puffs into the lungs every 6 (six) hours as needed for shortness of breath (COPD)., Disp: , Rfl:    allopurinol (ZYLOPRIM) 100 MG tablet, Take 1 tablet (100 mg total) by mouth daily., Disp: 30 tablet, Rfl: 0    budesonide-formoterol (SYMBICORT) 160-4.5 MCG/ACT inhaler, Inhale 2 puffs into the lungs in the morning and at bedtime., Disp: , Rfl:    carboxymethylcellulose (REFRESH PLUS) 0.5 % SOLN, Place 1 drop into both eyes 3 (three) times daily as needed (dry/irritated eyes.)., Disp: , Rfl:    carvedilol (COREG) 12.5  MG tablet, Take 1 tablet (12.5 mg total) by mouth 2 (two) times daily., Disp: 60 tablet, Rfl: 0   cetirizine (ZYRTEC) 10 MG tablet, Take 10 mg by mouth 2 (two) times daily., Disp: , Rfl:    clopidogrel (PLAVIX) 75 MG tablet, Take 75 mg by mouth daily., Disp: , Rfl:    fluticasone (FLONASE) 50 MCG/ACT nasal spray, Place 1 spray into both nostrils daily., Disp: , Rfl:    Insulin Lispro Prot & Lispro (HUMALOG 75/25 MIX) (75-25) 100 UNIT/ML Kwikpen, Inject 10-20 Units into the skin See admin instructions. Take 20 units is the morning and 10 units at bedtime, Disp: , Rfl:    levothyroxine (SYNTHROID) 200 MCG tablet, Take 1 tablet (200 mcg total) by mouth daily at 6 (six) AM., Disp: 30 tablet, Rfl: 0   lidocaine (LIDODERM) 5 %, 2 patches every 12 (twelve) hours. 12 hours off 1 patch Each side of lower back, Disp: , Rfl:    lidocaine-prilocaine (EMLA) cream, SMARTSIG:sparingly Topical 3 Times a Week, Disp: , Rfl:    magnesium oxide (MAG-OX) 400 (241.3 Mg) MG tablet, Take 1 tablet (400 mg total) by mouth 2 (two) times daily., Disp: 30 tablet, Rfl: 0   Multiple Vitamins-Iron (MULTIVITAMINS WITH IRON) TABS tablet, Take 1 tablet by mouth in the morning., Disp: , Rfl:    pantoprazole (PROTONIX) 40 MG tablet, Take 1 tablet (40 mg total) by mouth 2 (two) times daily before a meal., Disp: , Rfl:    sevelamer carbonate (RENVELA) 800 MG tablet, Take 1,600 mg by mouth 3 (three) times daily with meals., Disp: , Rfl:    thiamine (VITAMIN B-1) 100 MG tablet, Take 100 mg by mouth in the morning., Disp: , Rfl:   I spent 42 minutes dedicated to the care of this patient on the date of this encounter to include pre-visit  review of records, face-to-face time with the patient discussing conditions above, post visit ordering of testing, clinical documentation with the electronic health record, making appropriate referrals as documented, and communicating necessary findings to members of the patients care team.    Josephine Igo, DO [facility] 09/28/2022 10:00 AM

## 2022-10-24 ENCOUNTER — Encounter (HOSPITAL_COMMUNITY): Payer: Self-pay

## 2022-10-31 ENCOUNTER — Ambulatory Visit (INDEPENDENT_AMBULATORY_CARE_PROVIDER_SITE_OTHER): Payer: 59 | Admitting: Pulmonary Disease

## 2022-10-31 ENCOUNTER — Encounter: Payer: Self-pay | Admitting: Pulmonary Disease

## 2022-10-31 VITALS — BP 110/60 | HR 97 | Ht 63.0 in | Wt 204.0 lb

## 2022-10-31 DIAGNOSIS — R942 Abnormal results of pulmonary function studies: Secondary | ICD-10-CM

## 2022-10-31 DIAGNOSIS — N186 End stage renal disease: Secondary | ICD-10-CM

## 2022-10-31 DIAGNOSIS — R140 Abdominal distension (gaseous): Secondary | ICD-10-CM | POA: Diagnosis not present

## 2022-10-31 DIAGNOSIS — C3432 Malignant neoplasm of lower lobe, left bronchus or lung: Secondary | ICD-10-CM

## 2022-10-31 DIAGNOSIS — I5042 Chronic combined systolic (congestive) and diastolic (congestive) heart failure: Secondary | ICD-10-CM

## 2022-10-31 DIAGNOSIS — Z87891 Personal history of nicotine dependence: Secondary | ICD-10-CM

## 2022-10-31 DIAGNOSIS — R6881 Early satiety: Secondary | ICD-10-CM | POA: Diagnosis not present

## 2022-10-31 NOTE — Progress Notes (Signed)
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  Previously followed left lower lobe pulmonary nodule that was first identified in 2020.  Recent had a hospital admission in February 2023.  Repeat CT scan of the chest at 2.8 x 1.8 cm left lower lobe pulmonary nodule concerning for primary lung cancer.  She does have continued weight loss.  She does not have hemoptysis.  I saw her in the hospital in consultation with recommendations for a PET scan.  Patient had the nuclear medicine PET scan after she was discharged from the hospital.  CT PET scan was completed on 12/29/2021 which revealed a hypermetabolic 3 cm left lower lobe mass with an SUV max of 11.2 concerning for malignancy.  OV 07/30/2022: this is a 72 year old seen for evaluation after PET scan.  She had a PET scan restaging with Dr. Earlie Server.  She was diagnosed with a left lower lobe malignancy in the spring.  She had a repeat PET scan that now shows 2 nodules that are hypermetabolic in the lower lobe concerning for recurrence of malignancy.  Patient was referred back for consideration for repeat biopsy.There is now 2 adjacent intensely hypermetabolic nodules from the left lower lobe position concerning for intralobar metastasis.Initially patient underwent radiation treatments in June with Dr. Lisbeth Renshaw for a stage Ia, T1c, N0, M0 non-small cell lung cancer.  Dr. Earlie Server now requesting repeat bronchoscopy and biopsy of the 2 new nodules as they appear to be recurrence.  OV 10/31/2022: Here today for follow-up.  She is completed 3 sessions of SBRT.  She  has been very tired rundown lately.  She is been going to dialysis.  She had and her dialysis session early yesterday because the needle became dislodged.  She is complaining of early satiety bloating sensation that she gets full fast.  She does have diabetes unsure if she is ever had gastroparesis type symptoms before.      Past Medical History:  Diagnosis Date   Anemia    Arthritis    Asthma    Chronic diastolic (congestive) heart failure (HCC)    Chronic kidney disease    Dialysis T-TH-Sat   Chronic pain syndrome    Class 2 obesity due to excess calories with body mass index (BMI) of 36.0 to 36.9 in adult    COPD (chronic obstructive pulmonary disease) (HCC)    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   Full dentures    GERD (gastroesophageal reflux disease)    Gout    History of hiatal hernia    History of transfusion    Hx MRSA infection    abscess left groin   Hyperlipidemia    Hypertension    Hypothyroid    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HCC)    Peripheral vascular disease (HCC)    Sleep apnea    UNABLE TO TOLERATE C PAP   Stress incontinence    TIA (transient ischemic attack)     X2 NO RESIDUAL PROBLEMS   Wears glasses      Family History  Problem Relation Age of Onset  Diabetes Brother    Cardiomyopathy Mother    Heart disease Mother    Cancer Father      Past Surgical History:  Procedure Laterality Date   ABDOMINAL HYSTERECTOMY     AV FISTULA PLACEMENT Left 12/01/2021   Procedure: INSERTION OF LEFT ARM ARTERIOVENOUS (AV) GORE-TEX GRAFT;  Surgeon: Serafina Mitchell, MD;  Location: [place];  Service: Vascular;  Laterality: Left;   BACK SURGERY  7893   [place] Left 04/05/2020   Procedure: BASILIC VEIN TRANSPOSITION FIRST STAGE;  Surgeon: Angelia Mould, MD;  Location: [place];  Service: Vascular;  Laterality: Left;   BRONCHIAL BIOPSY  01/23/2022   Procedure: BRONCHIAL BIOPSIES;  Surgeon: Garner Nash, DO;  Location: [facility];  Service: Pulmonary;;   BRONCHIAL BIOPSY  08/07/2022   Procedure: BRONCHIAL BIOPSIES;  Surgeon: Garner Nash, DO;  Location: [facility];  Service: Pulmonary;;   BRONCHIAL BRUSHINGS  08/07/2022   Procedure: BRONCHIAL BRUSHINGS;  Surgeon: Garner Nash, DO;  Location: [place];  Service: Pulmonary;;   BRONCHIAL NEEDLE ASPIRATION BIOPSY  01/23/2022   Procedure: BRONCHIAL NEEDLE ASPIRATION BIOPSIES;  Surgeon: Garner Nash, DO;  Location: [place];  Service: Pulmonary;;   BRONCHIAL NEEDLE ASPIRATION BIOPSY  08/07/2022   Procedure: BRONCHIAL NEEDLE ASPIRATION BIOPSIES;  Surgeon: Garner Nash, DO;  Location: [facility];  Service: Pulmonary;;   COLON SURGERY  1995   DUE TO POLYP   DILATION AND CURETTAGE OF UTERUS     ENDOBRONCHIAL ULTRASOUND  01/23/2022   Procedure: ENDOBRONCHIAL ULTRASOUND;  Surgeon: Garner Nash, DO;  Location: [facility];  Service: Pulmonary;;   ENTEROSCOPY N/A 07/10/2021   Procedure: ENTEROSCOPY;  Surgeon: Ronnette Juniper, MD;  Location: [facility];  Service: Gastroenterology;  Laterality: N/A;   ESOPHAGOGASTRODUODENOSCOPY N/A 01/25/2021   Procedure: ESOPHAGOGASTRODUODENOSCOPY (EGD);  Surgeon: Clarene Essex, MD;  Location: [facility];  Service: Endoscopy;  Laterality: N/A;   FINE NEEDLE ASPIRATION  01/23/2022   Procedure: FINE NEEDLE ASPIRATION (FNA) LINEAR;  Surgeon: Garner Nash, DO;  Location: [facility];  Service: Pulmonary;;   FOREARM FRACTURE SURGERY     Left arm   GIVENS CAPSULE STUDY N/A 07/07/2021   Procedure: GIVENS CAPSULE STUDY;  Surgeon: Clarene Essex, MD;  Location: [place];  Service: Endoscopy;  Laterality: N/A;   HERNIA REPAIR     w/ mesh   HOT HEMOSTASIS N/A 07/10/2021   Procedure: HOT HEMOSTASIS (ARGON PLASMA COAGULATION/BICAP);  Surgeon: Ronnette Juniper, MD;  Location: [place];  Service: Gastroenterology;  Laterality: N/A;   INCISION AND DRAINAGE ABSCESS Left 02/04/2013   Procedure: INCISION  AND DRAINAGE LEFT BUTTOCK ABSCESS; INCISION AND DRAINAGE LEFT BREAST ABSCESS;  Surgeon: Harl Bowie, MD;  Location: [facility];  Service: General;  Laterality: Left;   INCISION AND DRAINAGE ABSCESS N/A 02/12/2013   Procedure: INCISION AND DEBRIDEMENT BUTTOCK WOUND ;  Surgeon: [name]. Georgette Dover, MD;  Location: [facility];  Service: General;  Laterality: N/A;   INCISION AND DRAINAGE ABSCESS N/A 02/14/2013   Procedure: INCISION AND DRAINAGE/DRESSING CHANGE;  Surgeon: Harl Bowie, MD;  Location: [place];  Service: General;  Laterality: N/A;   INCISION AND DRAINAGE ABSCESS N/A 03/21/2015   Procedure: INCISION AND DRAINAGE PUBIC ABSCESS;  Surgeon: Excell Seltzer, MD;  Location: [facility];  Service: General;  Laterality: N/A;   INCISION AND DRAINAGE PERIRECTAL ABSCESS Left 02/10/2013   Procedure: IRRIGATION AND DEBRIDEMENT OF BUTTOCK/PERINEAL ABSCESS;  Surgeon: [name]. Georgette Dover, MD;  Location: [place];  Service:  General;  Laterality: Left;   INCISION AND DRAINAGE PERIRECTAL ABSCESS N/A 02/16/2013   Procedure: IRRIGATION AND DEBRIDEMENT PERINEAL ABSCESS;  Surgeon: Zenovia Jarred, MD;  Location: [place];  Service: General;  Laterality: N/A;   IR FLUORO GUIDE CV LINE RIGHT  11/30/2021   IR US GUIDE VASC ACCESS RIGHT  11/30/2021   IRRIGATION AND DEBRIDEMENT ABSCESS N/A 02/06/2013   Procedure: IRRIGATION AND DEBRIDEMENT BUTTOCK ABSCESS AND DRESSING CHANGE;  Surgeon: Harl Bowie, MD;  Location: [place];  Service: General;  Laterality: N/A;   IRRIGATION AND DEBRIDEMENT ABSCESS Left 02/08/2013   Procedure: IRRIGATION AND DEBRIDEMENT ABSCESS/DRESSING CHANGE;  Surgeon: Gwenyth Ober, MD;  Location: [facility];  Service: General;  Laterality: Left;   JOINT REPLACEMENT  2010 / 2012    LAPAROSCOPIC CHOLECYSTECTOMY     [place] N/A 07/02/2014   Procedure: LEFT HEART CATHETERIZATION WITH CORONARY ANGIOGRAM;  Surgeon: Lorretta Harp, MD;  Location: [facility];  Service: Cardiovascular;   Laterality: N/A;   LOWER EXTREMITY ANGIOGRAM Right 04/20/2016   Procedure: Lower Extremity Angiogram;  Surgeon: Elam Dutch, MD;  Location: [place] [facility];  Service: Cardiovascular;  Laterality: Right;   MULTIPLE TOOTH EXTRACTIONS     PERIPHERAL VASCULAR CATHETERIZATION N/A 04/20/2016   Procedure: Abdominal Aortogram;  Surgeon: Elam Dutch, MD;  Location: [place] [facility];  Service: Cardiovascular;  Laterality: N/A;   PERIPHERAL VASCULAR CATHETERIZATION Right 04/20/2016   Procedure: Peripheral Vascular Intervention;  Surgeon: Elam Dutch, MD;  Location: [place] [facility];  Service: Cardiovascular;  Laterality: Right;  popiteal   RIGHT HEART CATH N/A 10/27/2018   Procedure: RIGHT HEART CATH;  Surgeon: Larey Dresser, MD;  Location: [place] [facility];  Service: Cardiovascular;  Laterality: N/A;   RIGHT HEART CATH N/A 03/20/2019   Procedure: RIGHT HEART CATH;  Surgeon: Larey Dresser, MD;  Location: [place] [facility];  Service: Cardiovascular;  Laterality: N/A;   THYROIDECTOMY     TOTAL HIP REVISION Right 08/11/2014   Procedure: RIGHT ACETABULAR REVISION;  Surgeon: Gearlean Alf, MD;  Location: [facility];  Service: Orthopedics;  Laterality: Right;   VASCULAR SURGERY     VIDEO BRONCHOSCOPY WITH ENDOBRONCHIAL ULTRASOUND Bilateral 08/07/2022   Procedure: VIDEO BRONCHOSCOPY WITH ENDOBRONCHIAL ULTRASOUND;  Surgeon: Garner Nash, DO;  Location: [place];  Service: Pulmonary;  Laterality: Bilateral;   VIDEO BRONCHOSCOPY WITH RADIAL ENDOBRONCHIAL ULTRASOUND  01/23/2022   Procedure: RADIAL ENDOBRONCHIAL ULTRASOUND;  Surgeon: Garner Nash, DO;  Location: [facility];  Service: Pulmonary;;    Social History   Socioeconomic History   Marital status: Legally Separated    Spouse name: Not on file   Number of children: 1   Years of education: college   Highest education level: Not on file  Occupational History    Comment: Disabled  Tobacco Use   Smoking status: Former     Packs/day: 0.25    Years: 40.00    Total pack years: 10.00    Types: Cigarettes    Quit date: 10/2021    Years since quitting: 1.0    Passive exposure: Past   Smokeless tobacco: Former  Scientific laboratory technician Use: Never used  Substance and Sexual Activity   Alcohol use: Yes    Alcohol/week: 1.0 standard drink of alcohol    Types: 1 Standard drinks or equivalent per week    Comment: OCC   Drug use: No   Sexual activity:  Not Currently    Birth control/protection: Surgical    Comment: Hysterectomy  Other Topics Concern   Not on file  Social History Narrative   ** Merged History Encounter **       Patient is separated  and lives at home with her friend. Patient is disabled. Education one year of college Right handed Caffeine two cups daily.     Social Determinants of Health   Financial Resource Strain: Not on file  Food Insecurity: Not on file  Transportation Needs: Not on file  Physical Activity: Not on file  Stress: Not on file  Social Connections: Not on file  Intimate Partner Violence: Not At Risk (03/07/2022)   Humiliation, Afraid, Rape, and Kick questionnaire    Fear of Current or Ex-Partner: No    Emotionally Abused: No    Physically Abused: No    Sexually Abused: No     Allergies  Allergen Reactions   Nebivolol Swelling    Chest pain (reaction to Bystolic)   Zestril [Lisinopril] Swelling and Other (See Comments)   Ace Inhibitors Swelling and Other (See Comments)    Tongue swell   Morphine And Related Itching     Outpatient Medications Prior to Visit  Medication Sig Dispense Refill   albuterol (PROVENTIL) (2.5 MG/3ML) 0.083% nebulizer solution Take 3 mLs (2.5 mg total) by nebulization every 6 (six) hours as needed for wheezing or shortness of breath. 75 mL 12   albuterol (VENTOLIN HFA) 108 (90 Base) MCG/ACT inhaler Inhale 2 puffs into the lungs every 6 (six) hours as needed for shortness of breath (COPD).     budesonide-formoterol (SYMBICORT) 160-4.5  MCG/ACT inhaler Inhale 2 puffs into the lungs in the morning and at bedtime.     cetirizine (ZYRTEC) 10 MG tablet Take 10 mg by mouth 2 (two) times daily.     fluticasone (FLONASE) 50 MCG/ACT nasal spray Place 1 spray into both nostrils daily.     acetaminophen (TYLENOL) 500 MG tablet Take 1,000 mg by mouth 3 (three) times daily.     allopurinol (ZYLOPRIM) 100 MG tablet Take 1 tablet (100 mg total) by mouth daily. 30 tablet 0   carboxymethylcellulose (REFRESH PLUS) 0.5 % SOLN Place 1 drop into both eyes 3 (three) times daily as needed (dry/irritated eyes.).     carvedilol (COREG) 12.5 MG tablet Take 1 tablet (12.5 mg total) by mouth 2 (two) times daily. 60 tablet 0   clopidogrel (PLAVIX) 75 MG tablet Take 75 mg by mouth daily.     Insulin Lispro Prot & Lispro (HUMALOG 75/25 MIX) (75-25) 100 UNIT/ML Kwikpen Inject 10-20 Units into the skin See admin instructions. Take 20 units is the morning and 10 units at bedtime     levothyroxine (SYNTHROID) 200 MCG tablet Take 1 tablet (200 mcg total) by mouth daily at 6 (six) AM. 30 tablet 0   lidocaine (LIDODERM) 5 % 2 patches every 12 (twelve) hours. 12 hours off 1 patch Each side of lower back     lidocaine-prilocaine (EMLA) cream SMARTSIG:sparingly Topical 3 Times a Week     magnesium oxide (MAG-OX) 400 (241.3 Mg) MG tablet Take 1 tablet (400 mg total) by mouth 2 (two) times daily. 30 tablet 0   Multiple Vitamins-Iron (MULTIVITAMINS WITH IRON) TABS tablet Take 1 tablet by mouth in the morning.     pantoprazole (PROTONIX) 40 MG tablet Take 1 tablet (40 mg total) by mouth 2 (two) times daily before a meal.     sevelamer carbonate (RENVELA)  800 MG tablet Take 1,600 mg by mouth 3 (three) times daily with meals.     thiamine (VITAMIN B-1) 100 MG tablet Take 100 mg by mouth in the morning.     No facility-administered medications prior to visit.    Review of Systems  Constitutional:  Negative for chills, fever, malaise/fatigue and weight loss.  HENT:   Negative for hearing loss, sore throat and tinnitus.   Eyes:  Negative for blurred vision and double vision.  Respiratory:  Positive for shortness of breath. Negative for cough, hemoptysis, sputum production, wheezing and stridor.   Cardiovascular:  Negative for chest pain, palpitations, orthopnea, leg swelling and PND.  Gastrointestinal:  Positive for nausea. Negative for abdominal pain, constipation, diarrhea, heartburn and vomiting.       Bloating, early satiety  Genitourinary:  Negative for dysuria, hematuria and urgency.  Musculoskeletal:  Negative for joint pain and myalgias.  Skin:  Negative for itching and rash.  Neurological:  Negative for dizziness, tingling, weakness and headaches.  Endo/Heme/Allergies:  Negative for environmental allergies. Does not bruise/bleed easily.  Psychiatric/Behavioral:  Negative for depression. The patient is not nervous/anxious and does not have insomnia.   All other systems reviewed and are negative.    Objective:  Physical Exam Vitals reviewed.  Constitutional:      General: She is not in acute distress.    Appearance: She is well-developed. She is obese.  HENT:     Head: Normocephalic and atraumatic.  Eyes:     General: No scleral icterus.    Conjunctiva/sclera: Conjunctivae normal.     Pupils: Pupils are equal, round, and reactive to light.  Neck:     Vascular: No JVD.     Trachea: No tracheal deviation.  Cardiovascular:     Rate and Rhythm: Normal rate and regular rhythm.     Heart sounds: Normal heart sounds. No murmur heard. Pulmonary:     Effort: Pulmonary effort is normal. No tachypnea, accessory muscle usage or respiratory distress.     Breath sounds: No stridor. No wheezing, rhonchi or rales.  Abdominal:     General: There is no distension.     Palpations: Abdomen is soft.     Tenderness: There is no abdominal tenderness.  Musculoskeletal:        General: No tenderness.     Cervical back: Neck supple.  Lymphadenopathy:      Cervical: No cervical adenopathy.  Skin:    General: Skin is warm and dry.     Capillary Refill: Capillary refill takes less than 2 seconds.     Findings: No rash.  Neurological:     Mental Status: She is alert and oriented to person, place, and time.  Psychiatric:        Behavior: Behavior normal.     Vitals:   10/31/22 1316  BP: 110/60  Pulse: 97  SpO2: (!) 88%  Weight: 204 lb (92.5 kg)  Height: 5\' 3"  (1.6 m)   (!) 88% on RA BMI Readings from Last 3 Encounters:  10/31/22 36.14 kg/m  08/08/22 40.77 kg/m  07/30/22 36.14 kg/m   Wt Readings from Last 3 Encounters:  10/31/22 204 lb (92.5 kg)  08/08/22 230 lb 2.6 oz (104.4 kg)  07/30/22 204 lb (92.5 kg)     CBC    Component Value Date/Time   WBC 9.0 08/08/2022 0130   RBC 3.45 (L) 08/08/2022 0130   HGB 10.3 (L) 08/08/2022 0130   HGB 10.3 (L) 06/25/2022 0955   HCT  35.2 (L) 08/08/2022 0130   HCT 24.6 (L) 10/17/2020 1235   PLT 215 08/08/2022 0130   PLT 227 06/25/2022 0955   MCV 102.0 (H) 08/08/2022 0130   MCH 29.9 08/08/2022 0130   MCHC 29.3 (L) 08/08/2022 0130   RDW 16.0 (H) 08/08/2022 0130   RDW 15.7 (H) 12/23/2013 1132   LYMPHSABS 1.0 08/07/2022 1822   LYMPHSABS 2.5 12/23/2013 1132   MONOABS 0.3 08/07/2022 1822   EOSABS 0.0 08/07/2022 1822   EOSABS 0.1 12/23/2013 1132   BASOSABS 0.0 08/07/2022 1822   BASOSABS 0.0 12/23/2013 1132      Chest Imaging: Nuclear medicine pet imaging March 6160: Hypermetabolic left lower lobe lung mass concerning for primary malignancy. The patient's images have been independently reviewed by me.    October 2023 nuclear medicine pet imaging: 2 hypermetabolic nodules within the left lower concern lobe concerning for recurrence of malignancy. The patient's images have been independently reviewed by me.    Pulmonary Functions Testing Results:    Latest Ref Rng & Units 05/08/2022   11:50 AM  PFT Results  FVC-Pre L 1.52   FVC-Predicted Pre % 53   FVC-Post L 1.48    FVC-Predicted Post % 52   Pre FEV1/FVC % % 77   Post FEV1/FCV % % 80   FEV1-Pre L 1.17   FEV1-Predicted Pre % 54   FEV1-Post L 1.19   DLCO uncorrected ml/min/mmHg 9.41   DLCO UNC% % 50   DLCO corrected ml/min/mmHg 9.41   DLCO COR %Predicted % 50   DLVA Predicted % 65   TLC L 3.74   TLC % Predicted % 76   RV % Predicted % 97     FeNO:   Pathology:   Echocardiogram:   Heart Catheterization:     Assessment & Plan:     ICD-10-CM   1. Early satiety  R68.81 Ambulatory referral to Gastroenterology    2. Bloating  R14.0 Ambulatory referral to Gastroenterology    3. Malignant neoplasm of lower lobe of left lung (HCC)  C34.32     4. Abnormal PET scan of lung  R94.2     5. Former smoker  Z87.891     47. Chronic combined systolic and diastolic congestive heart failure (HCC)  I50.42     7. ESRD (end stage renal disease) ([place])  N18.6       Discussion:  This is a 72 year old female obese, former smoker, 40-pack-year history initially had an incidental nodule in 2020 subsequent follow-up with enlargement was taken for bronchoscopy biopsy.  Underwent SBRT.  Then concern for recurrence of disease within the same lobe.  She was taken again for bronchoscopy biopsy confirmed recurrence of non-small cell lung cancer.  She completed an additional session 3 SBRT sessions with radiation oncology.  Currently complaining predominantly of GI symptoms.  Respiratory wise she has been stable.  Plan: Continue current inhaler regimen, Symbicort plus albuterol Follow-up with primary care Ambulatory referral to GI for evaluation of possible gastroparesis due to early satiety bloating feeling in her history of diabetes Continue dialysis per nephrology. Follow-up with [name] as needed. Continued image surveillance already completed by medical oncology.  She has a appointment coming up for repeat CT and follow-up with Dr. Earlie Server.    Current Outpatient Medications:    albuterol (PROVENTIL) (2.5  MG/3ML) 0.083% nebulizer solution, Take 3 mLs (2.5 mg total) by nebulization every 6 (six) hours as needed for wheezing or shortness of breath., Disp: 75 mL, Rfl: 12  albuterol (VENTOLIN HFA) 108 (90 Base) MCG/ACT inhaler, Inhale 2 puffs into the lungs every 6 (six) hours as needed for shortness of breath (COPD)., Disp: , Rfl:    budesonide-formoterol (SYMBICORT) 160-4.5 MCG/ACT inhaler, Inhale 2 puffs into the lungs in the morning and at bedtime., Disp: , Rfl:    cetirizine (ZYRTEC) 10 MG tablet, Take 10 mg by mouth 2 (two) times daily., Disp: , Rfl:    fluticasone (FLONASE) 50 MCG/ACT nasal spray, Place 1 spray into both nostrils daily., Disp: , Rfl:    acetaminophen (TYLENOL) 500 MG tablet, Take 1,000 mg by mouth 3 (three) times daily., Disp: , Rfl:    allopurinol (ZYLOPRIM) 100 MG tablet, Take 1 tablet (100 mg total) by mouth daily., Disp: 30 tablet, Rfl: 0   carboxymethylcellulose (REFRESH PLUS) 0.5 % SOLN, Place 1 drop into both eyes 3 (three) times daily as needed (dry/irritated eyes.)., Disp: , Rfl:    carvedilol (COREG) 12.5 MG tablet, Take 1 tablet (12.5 mg total) by mouth 2 (two) times daily., Disp: 60 tablet, Rfl: 0   clopidogrel (PLAVIX) 75 MG tablet, Take 75 mg by mouth daily., Disp: , Rfl:    Insulin Lispro Prot & Lispro (HUMALOG 75/25 MIX) (75-25) 100 UNIT/ML Kwikpen, Inject 10-20 Units into the skin See admin instructions. Take 20 units is the morning and 10 units at bedtime, Disp: , Rfl:    levothyroxine (SYNTHROID) 200 MCG tablet, Take 1 tablet (200 mcg total) by mouth daily at 6 (six) AM., Disp: 30 tablet, Rfl: 0   lidocaine (LIDODERM) 5 %, 2 patches every 12 (twelve) hours. 12 hours off 1 patch Each side of lower back, Disp: , Rfl:    lidocaine-prilocaine (EMLA) cream, SMARTSIG:sparingly Topical 3 Times a Week, Disp: , Rfl:    magnesium oxide (MAG-OX) 400 (241.3 Mg) MG tablet, Take 1 tablet (400 mg total) by mouth 2 (two) times daily., Disp: 30 tablet, Rfl: 0   Multiple  Vitamins-Iron (MULTIVITAMINS WITH IRON) TABS tablet, Take 1 tablet by mouth in the morning., Disp: , Rfl:    pantoprazole (PROTONIX) 40 MG tablet, Take 1 tablet (40 mg total) by mouth 2 (two) times daily before a meal., Disp: , Rfl:    sevelamer carbonate (RENVELA) 800 MG tablet, Take 1,600 mg by mouth 3 (three) times daily with meals., Disp: , Rfl:    thiamine (VITAMIN B-1) 100 MG tablet, Take 100 mg by mouth in the morning., Disp: , Rfl:    Garner Nash, DO [facility] 10/31/2022 1:36 PM

## 2022-10-31 NOTE — Patient Instructions (Addendum)
Thank you for visiting Dr. Valeta Harms at [facility]. Today we recommend the following:  Continue albuterol and symbicort  Referral to Gastroenterology for possible gastroparesis symptoms   Return if symptoms worsen or fail to improve, for with APP or Dr. Valeta Harms.    Please do your part to reduce the spread of COVID-19.

## 2022-11-05 ENCOUNTER — Ambulatory Visit
Admission: RE | Admit: 2022-11-05 | Discharge: 2022-11-05 | Disposition: A | Discharge status: Home / Self Care | Payer: No Typology Code available for payment source | Source: Ambulatory Visit | Attending: Radiation Oncology | Admitting: Radiation Oncology

## 2022-11-05 NOTE — Progress Notes (Signed)
  Radiation Oncology         [phone] ________________________________  Name: [name] [name] MRN: [id number]  Date of Service: 11/05/2022  DOB: 01/30/1951  Post Treatment Telephone Note  Diagnosis:  Recurrent Stage IA3, cT1cN0M0, NSCLC, squamous cell carcinoma of the LLL  Intent: Curative  Radiation Treatment Dates:    09/21/2022 through 09/26/2022 SBRT Treatment Site Technique Total Dose (Gy) Dose per Fx (Gy) Completed Fx Beam Energies  Lung, Left:  2 LLL nodules treated in one isocenter IMRT 54/54 18 3/3 10XFFF  (as documented in provider EOT note)   The patient was available for call today.   Symptoms of fatigue have improved since completing therapy.  Symptoms of skin changes have improved since completing therapy.  Symptoms of esophagitis have improved since completing therapy.   The patient has scheduled follow up with her medical oncologist Dr. Julien Nordmann for ongoing care, and was encouraged to call if she develops concerns or questions regarding radiation.  This concludes the interview.   Leandra Kern, LPN

## 2022-11-09 ENCOUNTER — Other Ambulatory Visit: Payer: Self-pay

## 2022-11-09 ENCOUNTER — Encounter (HOSPITAL_COMMUNITY): Payer: Self-pay

## 2022-11-09 ENCOUNTER — Ambulatory Visit (HOSPITAL_COMMUNITY)
Admission: RE | Admit: 2022-11-09 | Discharge: 2022-11-09 | Disposition: A | Discharge status: Home / Self Care | Payer: 59 | Source: Ambulatory Visit | Attending: Internal Medicine | Admitting: Internal Medicine

## 2022-11-09 ENCOUNTER — Telehealth: Payer: Self-pay

## 2022-11-09 ENCOUNTER — Inpatient Hospital Stay: Payer: 59 | Attending: Internal Medicine

## 2022-11-09 DIAGNOSIS — G4730 Sleep apnea, unspecified: Secondary | ICD-10-CM | POA: Insufficient documentation

## 2022-11-09 DIAGNOSIS — G894 Chronic pain syndrome: Secondary | ICD-10-CM | POA: Insufficient documentation

## 2022-11-09 DIAGNOSIS — I2510 Atherosclerotic heart disease of native coronary artery without angina pectoris: Secondary | ICD-10-CM | POA: Insufficient documentation

## 2022-11-09 DIAGNOSIS — Z7902 Long term (current) use of antithrombotics/antiplatelets: Secondary | ICD-10-CM | POA: Insufficient documentation

## 2022-11-09 DIAGNOSIS — C3490 Malignant neoplasm of unspecified part of unspecified bronchus or lung: Secondary | ICD-10-CM

## 2022-11-09 DIAGNOSIS — Z8673 Personal history of transient ischemic attack (TIA), and cerebral infarction without residual deficits: Secondary | ICD-10-CM | POA: Insufficient documentation

## 2022-11-09 DIAGNOSIS — C3432 Malignant neoplasm of lower lobe, left bronchus or lung: Secondary | ICD-10-CM | POA: Insufficient documentation

## 2022-11-09 DIAGNOSIS — Z79890 Hormone replacement therapy (postmenopausal): Secondary | ICD-10-CM | POA: Insufficient documentation

## 2022-11-09 DIAGNOSIS — D631 Anemia in chronic kidney disease: Secondary | ICD-10-CM | POA: Insufficient documentation

## 2022-11-09 DIAGNOSIS — E785 Hyperlipidemia, unspecified: Secondary | ICD-10-CM | POA: Insufficient documentation

## 2022-11-09 DIAGNOSIS — J432 Centrilobular emphysema: Secondary | ICD-10-CM | POA: Insufficient documentation

## 2022-11-09 DIAGNOSIS — E109 Type 1 diabetes mellitus without complications: Secondary | ICD-10-CM | POA: Insufficient documentation

## 2022-11-09 DIAGNOSIS — I132 Hypertensive heart and chronic kidney disease with heart failure and with stage 5 chronic kidney disease, or end stage renal disease: Secondary | ICD-10-CM | POA: Insufficient documentation

## 2022-11-09 DIAGNOSIS — K219 Gastro-esophageal reflux disease without esophagitis: Secondary | ICD-10-CM | POA: Insufficient documentation

## 2022-11-09 DIAGNOSIS — Z79899 Other long term (current) drug therapy: Secondary | ICD-10-CM | POA: Insufficient documentation

## 2022-11-09 DIAGNOSIS — N186 End stage renal disease: Secondary | ICD-10-CM | POA: Insufficient documentation

## 2022-11-09 DIAGNOSIS — E039 Hypothyroidism, unspecified: Secondary | ICD-10-CM | POA: Insufficient documentation

## 2022-11-09 DIAGNOSIS — J449 Chronic obstructive pulmonary disease, unspecified: Secondary | ICD-10-CM | POA: Insufficient documentation

## 2022-11-09 LAB — CBC WITH DIFFERENTIAL (CANCER CENTER ONLY)
Abs Immature Granulocytes: 0.05 10*3/uL (ref 0.00–0.07)
Basophils Absolute: 0 10*3/uL (ref 0.0–0.1)
Basophils Relative: 0 %
Eosinophils Absolute: 0 10*3/uL (ref 0.0–0.5)
Eosinophils Relative: 1 %
HCT: 30.4 % — ABNORMAL LOW (ref 36.0–46.0)
Hemoglobin: 8.9 g/dL — ABNORMAL LOW (ref 12.0–15.0)
Immature Granulocytes: 1 %
Lymphocytes Relative: 14 %
Lymphs Abs: 1 10*3/uL (ref 0.7–4.0)
MCH: 30.7 pg (ref 26.0–34.0)
MCHC: 29.3 g/dL — ABNORMAL LOW (ref 30.0–36.0)
MCV: 104.8 fL — ABNORMAL HIGH (ref 80.0–100.0)
Monocytes Absolute: 0.6 10*3/uL (ref 0.1–1.0)
Monocytes Relative: 8 %
Neutro Abs: 5.5 10*3/uL (ref 1.7–7.7)
Neutrophils Relative %: 76 %
Platelet Count: 207 10*3/uL (ref 150–400)
RBC: 2.9 MIL/uL — ABNORMAL LOW (ref 3.87–5.11)
RDW: 17.3 % — ABNORMAL HIGH (ref 11.5–15.5)
WBC Count: 7.2 10*3/uL (ref 4.0–10.5)
nRBC: 0.7 % — ABNORMAL HIGH (ref 0.0–0.2)

## 2022-11-09 LAB — CMP (CANCER CENTER ONLY)
ALT: 9 U/L (ref 0–44)
AST: 14 U/L — ABNORMAL LOW (ref 15–41)
Albumin: 3.3 g/dL — ABNORMAL LOW (ref 3.5–5.0)
Alkaline Phosphatase: 111 U/L (ref 38–126)
Anion gap: 10 (ref 5–15)
BUN: 28 mg/dL — ABNORMAL HIGH (ref 8–23)
CO2: 30 mmol/L (ref 22–32)
Calcium: 9.1 mg/dL (ref 8.9–10.3)
Chloride: 98 mmol/L (ref 98–111)
Creatinine: 4.65 mg/dL (ref 0.44–1.00)
GFR, Estimated: 9 mL/min — ABNORMAL LOW (ref >60)
Glucose, Bld: 249 mg/dL — ABNORMAL HIGH (ref 70–99)
Potassium: 3.4 mmol/L — ABNORMAL LOW (ref 3.5–5.1)
Sodium: 138 mmol/L (ref 135–145)
Total Bilirubin: 0.3 mg/dL (ref 0.3–1.2)
Total Protein: 5.7 g/dL — ABNORMAL LOW (ref 6.5–8.1)

## 2022-11-09 NOTE — Telephone Encounter (Signed)
CRITICAL VALUE STICKER  CRITICAL VALUE: CREATININE 4.65  RECEIVER (on-site recipient of call): Gonzalo Waymire P. LPN  [place] NOTIFIED: 11/09/22 1053 AM   MESSENGER (representative from lab): PAM  MD NOTIFIED: Cassandra Heilingoetter PA-C  RESPONSE:  No recommendations at this time.

## 2022-11-12 ENCOUNTER — Other Ambulatory Visit: Payer: Self-pay

## 2022-11-12 ENCOUNTER — Inpatient Hospital Stay (HOSPITAL_BASED_OUTPATIENT_CLINIC_OR_DEPARTMENT_OTHER): Payer: 59 | Admitting: Internal Medicine

## 2022-11-12 VITALS — BP 131/46 | HR 88 | Temp 98.3°F | Resp 20

## 2022-11-12 DIAGNOSIS — Z79899 Other long term (current) drug therapy: Secondary | ICD-10-CM | POA: Diagnosis not present

## 2022-11-12 DIAGNOSIS — G4730 Sleep apnea, unspecified: Secondary | ICD-10-CM | POA: Diagnosis not present

## 2022-11-12 DIAGNOSIS — Z8673 Personal history of transient ischemic attack (TIA), and cerebral infarction without residual deficits: Secondary | ICD-10-CM | POA: Diagnosis not present

## 2022-11-12 DIAGNOSIS — E785 Hyperlipidemia, unspecified: Secondary | ICD-10-CM | POA: Diagnosis not present

## 2022-11-12 DIAGNOSIS — J432 Centrilobular emphysema: Secondary | ICD-10-CM | POA: Diagnosis not present

## 2022-11-12 DIAGNOSIS — E039 Hypothyroidism, unspecified: Secondary | ICD-10-CM | POA: Diagnosis not present

## 2022-11-12 DIAGNOSIS — G894 Chronic pain syndrome: Secondary | ICD-10-CM | POA: Diagnosis not present

## 2022-11-12 DIAGNOSIS — J449 Chronic obstructive pulmonary disease, unspecified: Secondary | ICD-10-CM | POA: Diagnosis not present

## 2022-11-12 DIAGNOSIS — N186 End stage renal disease: Secondary | ICD-10-CM | POA: Diagnosis not present

## 2022-11-12 DIAGNOSIS — D631 Anemia in chronic kidney disease: Secondary | ICD-10-CM | POA: Diagnosis not present

## 2022-11-12 DIAGNOSIS — E109 Type 1 diabetes mellitus without complications: Secondary | ICD-10-CM | POA: Diagnosis not present

## 2022-11-12 DIAGNOSIS — Z7902 Long term (current) use of antithrombotics/antiplatelets: Secondary | ICD-10-CM | POA: Diagnosis not present

## 2022-11-12 DIAGNOSIS — I132 Hypertensive heart and chronic kidney disease with heart failure and with stage 5 chronic kidney disease, or end stage renal disease: Secondary | ICD-10-CM | POA: Diagnosis not present

## 2022-11-12 DIAGNOSIS — K219 Gastro-esophageal reflux disease without esophagitis: Secondary | ICD-10-CM | POA: Diagnosis not present

## 2022-11-12 DIAGNOSIS — C3490 Malignant neoplasm of unspecified part of unspecified bronchus or lung: Secondary | ICD-10-CM | POA: Diagnosis not present

## 2022-11-12 DIAGNOSIS — Z85118 Personal history of other malignant neoplasm of bronchus and lung: Secondary | ICD-10-CM | POA: Diagnosis present

## 2022-11-12 DIAGNOSIS — Z79890 Hormone replacement therapy (postmenopausal): Secondary | ICD-10-CM | POA: Diagnosis not present

## 2022-11-12 DIAGNOSIS — I2510 Atherosclerotic heart disease of native coronary artery without angina pectoris: Secondary | ICD-10-CM | POA: Diagnosis not present

## 2022-11-12 DIAGNOSIS — C3432 Malignant neoplasm of lower lobe, left bronchus or lung: Secondary | ICD-10-CM | POA: Diagnosis not present

## 2022-11-12 NOTE — Progress Notes (Signed)
[place] [phone]   [phone]  OFFICE PROGRESS NOTE  Antony Blackbird, MD [address], [place] [address]  DIAGNOSIS: Recurrent non-small cell lung cancer diagnosed and September 2023 and initially diagnosed as stage IA (T1c, N0, M0) non-small cell lung cancer, squamous cell carcinoma presented with left lower lobe lung mass diagnosed in April 2023.  PRIOR THERAPY:  1) Status post curative radiotherapy to the left lower lobe lung mass under the care of Dr. Lisbeth Renshaw completed on April 06, 2022. 2) status SBRT to 2 left lower lobe nodules under the care of Dr. Lisbeth Renshaw completed September 26, 2022. CURRENT THERAPY: Observation.  INTERVAL HISTORY: [name] [name] 72 y.o. female returns to the clinic today for follow-up visit.  The patient continues to have the baseline fatigue and shortness of breath and she is currently on home oxygen.  She denied having any current chest pain, cough or hemoptysis.  She has no nausea, vomiting, diarrhea or constipation.  She has no headache or visual changes.  She denied having any recent weight loss or night sweats.  She tolerated her previous SBRT fairly well.  She is here today for evaluation and repeat CT scan of the chest for restaging of her disease.   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  MEDICATIONS:  Current Outpatient Medications  Medication Sig Dispense Refill   acetaminophen (TYLENOL) 500 MG tablet Take 1,000 mg by mouth 3 (three) times daily.     albuterol (PROVENTIL) (2.5 MG/3ML) 0.083% nebulizer solution Take 3 mLs (2.5 mg total) by nebulization every 6 (six) hours as needed for wheezing or shortness of breath. 75 mL 12   albuterol (VENTOLIN HFA) 108 (90 Base) MCG/ACT inhaler Inhale 2 puffs into the lungs every 6 (six) hours as needed for shortness of breath (COPD).     allopurinol (ZYLOPRIM) 100 MG tablet Take 1 tablet (100 mg total) by mouth daily. 30 tablet 0   budesonide-formoterol (SYMBICORT) 160-4.5 MCG/ACT inhaler Inhale 2 puffs into the lungs in the morning and at bedtime.     carboxymethylcellulose (REFRESH PLUS) 0.5 % SOLN Place 1 drop into both eyes 3 (three) times daily as needed (dry/irritated eyes.).     carvedilol (COREG) 12.5 MG tablet  Take 1 tablet (12.5 mg total) by mouth 2 (two) times daily. 60 tablet 0   cetirizine (ZYRTEC) 10 MG tablet Take 10 mg by mouth 2 (two) times daily.     clopidogrel (PLAVIX) 75 MG tablet Take 75 mg by mouth daily.     fluticasone (FLONASE) 50 MCG/ACT nasal spray Place 1 spray into both nostrils daily.     Insulin Lispro Prot & Lispro (HUMALOG 75/25 MIX) (75-25) 100 UNIT/ML Kwikpen Inject 10-20 Units into the skin See admin instructions. Take 20 units is the morning and 10 units at bedtime     levothyroxine (SYNTHROID) 200 MCG tablet Take 1 tablet (200 mcg total) by mouth daily at 6 (six) AM. 30 tablet 0   lidocaine (LIDODERM) 5 % 2 patches  every 12 (twelve) hours. 12 hours off 1 patch Each side of lower back     lidocaine-prilocaine (EMLA) cream SMARTSIG:sparingly Topical 3 Times a Week     magnesium oxide (MAG-OX) 400 (241.3 Mg) MG tablet Take 1 tablet (400 mg total) by mouth 2 (two) times daily. 30 tablet 0   Multiple Vitamins-Iron (MULTIVITAMINS WITH IRON) TABS tablet Take 1 tablet by mouth in the morning.     pantoprazole (PROTONIX) 40 MG tablet Take 1 tablet (40 mg total) by mouth 2 (two) times daily before a meal.     sevelamer carbonate (RENVELA) 800 MG tablet Take 1,600 mg by mouth 3 (three) times daily with meals.     thiamine (VITAMIN B-1) 100 MG tablet Take 100 mg by mouth in the morning.     No current facility-administered medications for this visit.    SURGICAL HISTORY:  Past Surgical History:  Procedure Laterality Date   ABDOMINAL HYSTERECTOMY     AV FISTULA PLACEMENT Left 12/01/2021   Procedure: INSERTION OF LEFT ARM ARTERIOVENOUS (AV) GORE-TEX GRAFT;  Surgeon: Serafina Mitchell, MD;  Location: [facility];  Service: Vascular;  Laterality: Left;   BACK SURGERY  7829   [place] Left 04/05/2020   Procedure: BASILIC VEIN TRANSPOSITION FIRST STAGE;  Surgeon: Angelia Mould, MD;  Location: [facility];  Service: Vascular;  Laterality: Left;   BRONCHIAL BIOPSY  01/23/2022   Procedure: BRONCHIAL BIOPSIES;  Surgeon: Garner Nash, DO;  Location: [facility];  Service: Pulmonary;;   BRONCHIAL BIOPSY  08/07/2022   Procedure: BRONCHIAL BIOPSIES;  Surgeon: Garner Nash, DO;  Location: [facility];  Service: Pulmonary;;   BRONCHIAL BRUSHINGS  08/07/2022   Procedure: BRONCHIAL BRUSHINGS;  Surgeon: Garner Nash, DO;  Location: [facility];  Service: Pulmonary;;   BRONCHIAL NEEDLE ASPIRATION BIOPSY  01/23/2022   Procedure: BRONCHIAL NEEDLE ASPIRATION BIOPSIES;  Surgeon: Garner Nash, DO;  Location: [facility];  Service: Pulmonary;;   BRONCHIAL NEEDLE ASPIRATION BIOPSY  08/07/2022   Procedure:  BRONCHIAL NEEDLE ASPIRATION BIOPSIES;  Surgeon: Garner Nash, DO;  Location: [facility];  Service: Pulmonary;;   COLON SURGERY  1995   DUE TO POLYP   DILATION AND CURETTAGE OF UTERUS     ENDOBRONCHIAL ULTRASOUND  01/23/2022   Procedure: ENDOBRONCHIAL ULTRASOUND;  Surgeon: Garner Nash, DO;  Location: [facility];  Service: Pulmonary;;   ENTEROSCOPY N/A 07/10/2021   Procedure: ENTEROSCOPY;  Surgeon: Ronnette Juniper, MD;  Location: [facility];  Service: Gastroenterology;  Laterality: N/A;   ESOPHAGOGASTRODUODENOSCOPY N/A 01/25/2021   Procedure: ESOPHAGOGASTRODUODENOSCOPY (EGD);  Surgeon: Clarene Essex, MD;  Location: [facility];  Service: Endoscopy;  Laterality: N/A;   FINE NEEDLE ASPIRATION  01/23/2022   Procedure:  FINE NEEDLE ASPIRATION (FNA) LINEAR;  Surgeon: Garner Nash, DO;  Location: [facility];  Service: Pulmonary;;   FOREARM FRACTURE SURGERY     Left arm   GIVENS CAPSULE STUDY N/A 07/07/2021   Procedure: GIVENS CAPSULE STUDY;  Surgeon: Clarene Essex, MD;  Location: [facility];  Service: Endoscopy;  Laterality: N/A;   HERNIA REPAIR     w/ mesh   HOT HEMOSTASIS N/A 07/10/2021   Procedure: HOT HEMOSTASIS (ARGON PLASMA COAGULATION/BICAP);  Surgeon: Ronnette Juniper, MD;  Location: [facility];  Service: Gastroenterology;  Laterality: N/A;   INCISION AND DRAINAGE ABSCESS Left 02/04/2013   Procedure: INCISION AND DRAINAGE LEFT BUTTOCK ABSCESS; INCISION AND DRAINAGE LEFT BREAST ABSCESS;  Surgeon: Harl Bowie, MD;  Location: [facility];  Service: General;  Laterality: Left;   INCISION AND DRAINAGE ABSCESS N/A 02/12/2013   Procedure: INCISION AND DEBRIDEMENT BUTTOCK WOUND ;  Surgeon: [name]. Georgette Dover, MD;  Location: [facility];  Service: General;  Laterality: N/A;   INCISION AND DRAINAGE ABSCESS N/A 02/14/2013   Procedure: INCISION AND DRAINAGE/DRESSING CHANGE;  Surgeon: Harl Bowie, MD;  Location: [facility];  Service: General;  Laterality: N/A;   INCISION AND DRAINAGE ABSCESS N/A 03/21/2015    Procedure: INCISION AND DRAINAGE PUBIC ABSCESS;  Surgeon: Excell Seltzer, MD;  Location: [facility];  Service: General;  Laterality: N/A;   INCISION AND DRAINAGE PERIRECTAL ABSCESS Left 02/10/2013   Procedure: IRRIGATION AND DEBRIDEMENT OF BUTTOCK/PERINEAL ABSCESS;  Surgeon: [name]. Georgette Dover, MD;  Location: [facility];  Service: General;  Laterality: Left;   INCISION AND DRAINAGE PERIRECTAL ABSCESS N/A 02/16/2013   Procedure: IRRIGATION AND DEBRIDEMENT PERINEAL ABSCESS;  Surgeon: Zenovia Jarred, MD;  Location: [facility];  Service: General;  Laterality: N/A;   IR FLUORO GUIDE CV LINE RIGHT  11/30/2021   IR US GUIDE VASC ACCESS RIGHT  11/30/2021   IRRIGATION AND DEBRIDEMENT ABSCESS N/A 02/06/2013   Procedure: IRRIGATION AND DEBRIDEMENT BUTTOCK ABSCESS AND DRESSING CHANGE;  Surgeon: Harl Bowie, MD;  Location: [facility];  Service: General;  Laterality: N/A;   IRRIGATION AND DEBRIDEMENT ABSCESS Left 02/08/2013   Procedure: IRRIGATION AND DEBRIDEMENT ABSCESS/DRESSING CHANGE;  Surgeon: Gwenyth Ober, MD;  Location: [facility];  Service: General;  Laterality: Left;   JOINT REPLACEMENT  2010 / 2012    LAPAROSCOPIC CHOLECYSTECTOMY     [place] N/A 07/02/2014   Procedure: LEFT HEART CATHETERIZATION WITH CORONARY ANGIOGRAM;  Surgeon: Lorretta Harp, MD;  Location: [facility];  Service: Cardiovascular;  Laterality: N/A;   LOWER EXTREMITY ANGIOGRAM Right 04/20/2016   Procedure: Lower Extremity Angiogram;  Surgeon: Elam Dutch, MD;  Location: [facility];  Service: Cardiovascular;  Laterality: Right;   MULTIPLE TOOTH EXTRACTIONS     PERIPHERAL VASCULAR CATHETERIZATION N/A 04/20/2016   Procedure: Abdominal Aortogram;  Surgeon: Elam Dutch, MD;  Location: [facility];  Service: Cardiovascular;  Laterality: N/A;   PERIPHERAL VASCULAR CATHETERIZATION Right 04/20/2016   Procedure: Peripheral Vascular Intervention;  Surgeon: Elam Dutch, MD;  Location: [facility];  Service: Cardiovascular;  Laterality: Right;  [place]   RIGHT HEART CATH N/A 10/27/2018   Procedure: RIGHT HEART CATH;  Surgeon: Larey Dresser, MD;  Location: [facility];  Service: Cardiovascular;  Laterality: N/A;   RIGHT HEART CATH N/A 03/20/2019   Procedure: RIGHT HEART CATH;  Surgeon: Larey Dresser, MD;  Location: [facility];  Service: Cardiovascular;  Laterality: N/A;  THYROIDECTOMY     TOTAL HIP REVISION Right 08/11/2014   Procedure: RIGHT ACETABULAR REVISION;  Surgeon: Gearlean Alf, MD;  Location: [facility];  Service: Orthopedics;  Laterality: Right;   VASCULAR SURGERY     VIDEO BRONCHOSCOPY WITH ENDOBRONCHIAL ULTRASOUND Bilateral 08/07/2022   Procedure: VIDEO BRONCHOSCOPY WITH ENDOBRONCHIAL ULTRASOUND;  Surgeon: Garner Nash, DO;  Location: [facility];  Service: Pulmonary;  Laterality: Bilateral;   VIDEO BRONCHOSCOPY WITH RADIAL ENDOBRONCHIAL ULTRASOUND  01/23/2022   Procedure: RADIAL ENDOBRONCHIAL ULTRASOUND;  Surgeon: Garner Nash, DO;  Location: [facility];  Service: Pulmonary;;    REVIEW OF SYSTEMS:  A comprehensive review of systems was negative except for: Constitutional: positive for fatigue Respiratory: positive for dyspnea on exertion   PHYSICAL EXAMINATION: General appearance: alert, cooperative, fatigued, and no distress Head: Normocephalic, without obvious abnormality, atraumatic Neck: no adenopathy, no JVD, supple, symmetrical, trachea midline, and thyroid not enlarged, symmetric, no tenderness/mass/nodules Lymph nodes: Cervical, supraclavicular, and axillary nodes normal. Resp: clear to auscultation bilaterally Back: symmetric, no curvature. ROM normal. No CVA tenderness. Cardio: regular rate and rhythm, S1, S2 normal, no murmur, click, rub or gallop GI: soft, non-tender; bowel sounds normal; no masses,  no organomegaly Extremities: extremities normal, atraumatic, no cyanosis or edema  ECOG PERFORMANCE STATUS: 1 -  Symptomatic but completely ambulatory  Blood pressure (!) 131/46, pulse 88, temperature 98.3 F (36.8 C), temperature source Oral, resp. rate 20, SpO2 97 %.  LABORATORY DATA: Lab Results  Component Value Date   WBC 7.2 11/09/2022   HGB 8.9 (L) 11/09/2022   HCT 30.4 (L) 11/09/2022   MCV 104.8 (H) 11/09/2022   PLT 207 11/09/2022      Chemistry      Component Value Date/Time   NA 138 11/09/2022 1000   NA 146 (H) 12/23/2013 1132   K 3.4 (L) 11/09/2022 1000   CL 98 11/09/2022 1000   CO2 30 11/09/2022 1000   BUN 28 (H) 11/09/2022 1000   BUN 31 (H) 12/23/2013 1132   CREATININE 4.65 (HH) 11/09/2022 1000   CREATININE 1.32 (H) 06/29/2014 1100      Component Value Date/Time   CALCIUM 9.1 11/09/2022 1000   CALCIUM 9.1 09/07/2021 1036   ALKPHOS 111 11/09/2022 1000   AST 14 (L) 11/09/2022 1000   ALT 9 11/09/2022 1000   BILITOT 0.3 11/09/2022 1000       RADIOGRAPHIC STUDIES: CT Chest Wo Contrast  Result Date: 11/09/2022 CLINICAL DATA:  Recurrent squamous cell left lower lobe lung cancer completed initial radiation therapy 04/06/2022 with biopsy-proven recurrence in the left lower lobe 08/07/2022, with subsequent radiation therapy completed 09/26/2022. End-stage renal disease on dialysis. Restaging. * Tracking Code: BO * EXAM: CT CHEST WITHOUT CONTRAST TECHNIQUE: Multidetector CT imaging of the chest was performed following the standard protocol without IV contrast. RADIATION DOSE REDUCTION: This exam was performed according to the departmental dose-optimization program which includes automated exposure control, adjustment of the mA and/or kV according to patient size and/or use of iterative reconstruction technique. COMPARISON:  08/07/2022 chest CT.  07/09/2022 PET-CT. FINDINGS: Cardiovascular: Stable mild cardiomegaly. No significant pericardial effusion/thickening. Left anterior descending and left circumflex coronary atherosclerosis. Atherosclerotic nonaneurysmal thoracic aorta. Normal  caliber pulmonary arteries. Mediastinum/Nodes: No significant thyroid nodules. Unremarkable esophagus. No pathologically enlarged axillary, mediastinal or hilar lymph nodes, noting limited sensitivity for the detection of hilar adenopathy on this noncontrast study. Lungs/Pleura: No pneumothorax. No pleural effusion. Mild paraseptal and centrilobular emphysema. Nodular fibrosis in the central left lower lobe measures 2.2  x 1.6 cm (series 6/image 75), previously 2.7 x 1.7 cm on 08/07/2022, not substantially changed. New thick bandlike sharply marginated consolidation throughout the basilar left lower lobe with associated volume loss, which encompasses the two previously visualized hypermetabolic solid pulmonary nodules in the medial left lower lobe, which are no longer discretely visualized. No new significant pulmonary nodules. Upper abdomen: Stable generalized adrenal thickening without discrete adrenal nodules, favoring adrenal hyperplasia. Several simple bilateral upper renal cysts, largest 3.0 cm in the left kidney, for which no follow-up imaging is recommended. Musculoskeletal: No aggressive appearing focal osseous lesions. Chronic scattered healed bilateral rib deformities. Marked thoracic spondylosis. IMPRESSION: 1. No evidence of residual or recurrent metastatic disease in the chest. 2. New thick bandlike sharply marginated consolidation throughout the basilar left lower lobe with associated volume loss, which encompasses the two previously visualized hypermetabolic solid pulmonary nodules in the medial left lower lobe, which are no longer discretely visualized. Findings are most compatible with evolving postradiation change. 3. Stable post treatment changes at the site of the original primary bronchogenic malignancy in the central left lower lobe, with no evidence of tumor recurrence in this location. 4. Stable mild cardiomegaly. Two-vessel coronary atherosclerosis. 5. Aortic Atherosclerosis (ICD10-I70.0)  and Emphysema (ICD10-J43.9). Electronically Signed   By: Ilona Sorrel M.D.   On: 11/09/2022 16:20    ASSESSMENT AND PLAN: This is a very pleasant 72 years old African-American female with recurrent non-small cell lung cancer in September 2023 that was initially diagnosed as stage IA (T1c, N0, M0) non-small cell lung cancer, squamous cell carcinoma presented with left lower lobe lung mass status post curative radiotherapy under the care of Dr. Lisbeth Renshaw completed April 06, 2022. The patient has been on observation since that time and repeat CT scan of the chest performed recently showed 2 new separate solid basilar left lower lobe pulmonary nodules the largest measured 1.6 cm compatible with new pulmonary metastasis. The patient had a PET scan that showed hypermetabolic activity in the 2 pulmonary nodules in the left lower lobe. She underwent bronchoscopy with repeat biopsy of the left lower lobe nodules and it was consistent with a squamous cell carcinoma. The patient underwent SBRT to the 2 left lower lobe pulmonary nodule under the care of Dr. Lisbeth Renshaw and tolerated the procedure fairly well. She had repeat CT scan of the chest performed recently.  I personally and independently reviewed the scan and discussed the result with the patient today. Her scan showed no concerning findings for disease progression and there was some evaluation radiation changes in that area. I recommended for the patient to continue on observation with repeat CT scan of the chest in 6 months. The patient was advised to call immediately if she has any other concerning symptoms in the interval. For the anemia and end-stage renal disease, she is followed by nephrology. The patient voices understanding of current disease status and treatment options and is in agreement with the current care plan.  All questions were answered. The patient knows to call the clinic with any problems, questions or concerns. We can certainly see the patient  much sooner if necessary.  The total time spent in the appointment was 20 minutes.  Disclaimer: This note was dictated with voice recognition software. Similar sounding words can inadvertently be transcribed and may not be corrected upon review.

## 2022-12-15 ENCOUNTER — Emergency Department (HOSPITAL_COMMUNITY): Payer: 59

## 2022-12-15 ENCOUNTER — Emergency Department (HOSPITAL_COMMUNITY)
Admission: EM | Admit: 2022-12-15 | Discharge: 2022-12-15 | Disposition: A | Discharge status: Home / Self Care | Payer: 59 | Attending: Emergency Medicine | Admitting: Emergency Medicine

## 2022-12-15 ENCOUNTER — Encounter (HOSPITAL_COMMUNITY): Payer: Self-pay

## 2022-12-15 DIAGNOSIS — Z794 Long term (current) use of insulin: Secondary | ICD-10-CM | POA: Diagnosis not present

## 2022-12-15 DIAGNOSIS — N186 End stage renal disease: Secondary | ICD-10-CM | POA: Insufficient documentation

## 2022-12-15 DIAGNOSIS — Z79899 Other long term (current) drug therapy: Secondary | ICD-10-CM | POA: Insufficient documentation

## 2022-12-15 DIAGNOSIS — S0990XA Unspecified injury of head, initial encounter: Secondary | ICD-10-CM | POA: Diagnosis present

## 2022-12-15 DIAGNOSIS — Z992 Dependence on renal dialysis: Secondary | ICD-10-CM | POA: Diagnosis not present

## 2022-12-15 DIAGNOSIS — Y93F2 Activity, caregiving, lifting: Secondary | ICD-10-CM | POA: Insufficient documentation

## 2022-12-15 DIAGNOSIS — W240XXA Contact with lifting devices, not elsewhere classified, initial encounter: Secondary | ICD-10-CM | POA: Insufficient documentation

## 2022-12-15 DIAGNOSIS — Z7901 Long term (current) use of anticoagulants: Secondary | ICD-10-CM | POA: Diagnosis not present

## 2022-12-15 DIAGNOSIS — E1122 Type 2 diabetes mellitus with diabetic chronic kidney disease: Secondary | ICD-10-CM | POA: Diagnosis not present

## 2022-12-15 DIAGNOSIS — S0003XA Contusion of scalp, initial encounter: Secondary | ICD-10-CM | POA: Diagnosis not present

## 2022-12-15 DIAGNOSIS — I120 Hypertensive chronic kidney disease with stage 5 chronic kidney disease or end stage renal disease: Secondary | ICD-10-CM | POA: Diagnosis not present

## 2022-12-15 LAB — CBG MONITORING, ED: Glucose-Capillary: 193 mg/dL — ABNORMAL HIGH (ref 70–99)

## 2022-12-15 MED ORDER — ACETAMINOPHEN 500 MG PO TABS
1000.0000 mg | ORAL_TABLET | Freq: Once | ORAL | Status: AC
  Administered 2022-12-15: 1000 mg via ORAL
  Filled 2022-12-15: qty 2

## 2022-12-15 NOTE — ED Notes (Signed)
Primary RN Cat, notified of pt/ report given.
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
## 2022-12-15 NOTE — Discharge Instructions (Signed)
Workup today was reassuring, take Tylenol as needed for headache.  Return to the ED for new or concerning symptoms.
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
## 2023-02-13 ENCOUNTER — Ambulatory Visit
Admission: RE | Admit: 2023-02-13 | Discharge: 2023-02-13 | Disposition: A | Discharge status: Home / Self Care | Payer: 59 | Source: Ambulatory Visit | Attending: Family | Admitting: Family

## 2023-02-13 ENCOUNTER — Other Ambulatory Visit: Payer: Self-pay | Admitting: Family

## 2023-02-13 DIAGNOSIS — G8929 Other chronic pain: Secondary | ICD-10-CM

## 2023-02-21 ENCOUNTER — Encounter: Payer: Self-pay | Admitting: Family

## 2023-04-13 ENCOUNTER — Emergency Department (HOSPITAL_COMMUNITY): Payer: 59

## 2023-04-13 ENCOUNTER — Encounter (HOSPITAL_COMMUNITY): Payer: Self-pay

## 2023-04-13 ENCOUNTER — Other Ambulatory Visit: Payer: Self-pay

## 2023-04-13 ENCOUNTER — Inpatient Hospital Stay (HOSPITAL_COMMUNITY)
Admission: EM | Admit: 2023-04-13 | Discharge: 2023-04-17 | DRG: 313 | Disposition: A | Discharge status: Skilled Nursing Facility | Payer: 59 | Source: Ambulatory Visit | Attending: Internal Medicine | Admitting: Internal Medicine

## 2023-04-13 DIAGNOSIS — N2581 Secondary hyperparathyroidism of renal origin: Secondary | ICD-10-CM | POA: Diagnosis present

## 2023-04-13 DIAGNOSIS — E785 Hyperlipidemia, unspecified: Secondary | ICD-10-CM | POA: Diagnosis not present

## 2023-04-13 DIAGNOSIS — N186 End stage renal disease: Secondary | ICD-10-CM | POA: Diagnosis not present

## 2023-04-13 DIAGNOSIS — D649 Anemia, unspecified: Secondary | ICD-10-CM | POA: Diagnosis present

## 2023-04-13 DIAGNOSIS — I493 Ventricular premature depolarization: Secondary | ICD-10-CM | POA: Diagnosis present

## 2023-04-13 DIAGNOSIS — E1165 Type 2 diabetes mellitus with hyperglycemia: Secondary | ICD-10-CM | POA: Diagnosis present

## 2023-04-13 DIAGNOSIS — R0789 Other chest pain: Secondary | ICD-10-CM | POA: Diagnosis not present

## 2023-04-13 DIAGNOSIS — Z751 Person awaiting admission to adequate facility elsewhere: Secondary | ICD-10-CM

## 2023-04-13 DIAGNOSIS — Z6841 Body Mass Index (BMI) 40.0 and over, adult: Secondary | ICD-10-CM

## 2023-04-13 DIAGNOSIS — Z8673 Personal history of transient ischemic attack (TIA), and cerebral infarction without residual deficits: Secondary | ICD-10-CM

## 2023-04-13 DIAGNOSIS — Z87891 Personal history of nicotine dependence: Secondary | ICD-10-CM

## 2023-04-13 DIAGNOSIS — C3430 Malignant neoplasm of lower lobe, unspecified bronchus or lung: Secondary | ICD-10-CM | POA: Diagnosis present

## 2023-04-13 DIAGNOSIS — E1151 Type 2 diabetes mellitus with diabetic peripheral angiopathy without gangrene: Secondary | ICD-10-CM | POA: Diagnosis present

## 2023-04-13 DIAGNOSIS — M109 Gout, unspecified: Secondary | ICD-10-CM | POA: Diagnosis present

## 2023-04-13 DIAGNOSIS — Z79899 Other long term (current) drug therapy: Secondary | ICD-10-CM

## 2023-04-13 DIAGNOSIS — R079 Chest pain, unspecified: Secondary | ICD-10-CM

## 2023-04-13 DIAGNOSIS — E1122 Type 2 diabetes mellitus with diabetic chronic kidney disease: Secondary | ICD-10-CM | POA: Diagnosis present

## 2023-04-13 DIAGNOSIS — Z7902 Long term (current) use of antithrombotics/antiplatelets: Secondary | ICD-10-CM

## 2023-04-13 DIAGNOSIS — Z794 Long term (current) use of insulin: Secondary | ICD-10-CM

## 2023-04-13 DIAGNOSIS — G894 Chronic pain syndrome: Secondary | ICD-10-CM | POA: Diagnosis present

## 2023-04-13 DIAGNOSIS — E6609 Other obesity due to excess calories: Secondary | ICD-10-CM

## 2023-04-13 DIAGNOSIS — Z885 Allergy status to narcotic agent status: Secondary | ICD-10-CM

## 2023-04-13 DIAGNOSIS — I428 Other cardiomyopathies: Secondary | ICD-10-CM | POA: Diagnosis present

## 2023-04-13 DIAGNOSIS — I10 Essential (primary) hypertension: Secondary | ICD-10-CM | POA: Diagnosis not present

## 2023-04-13 DIAGNOSIS — Z79890 Hormone replacement therapy (postmenopausal): Secondary | ICD-10-CM

## 2023-04-13 DIAGNOSIS — Z635 Disruption of family by separation and divorce: Secondary | ICD-10-CM

## 2023-04-13 DIAGNOSIS — Z888 Allergy status to other drugs, medicaments and biological substances status: Secondary | ICD-10-CM

## 2023-04-13 DIAGNOSIS — L299 Pruritus, unspecified: Secondary | ICD-10-CM | POA: Diagnosis present

## 2023-04-13 DIAGNOSIS — E039 Hypothyroidism, unspecified: Secondary | ICD-10-CM | POA: Diagnosis present

## 2023-04-13 DIAGNOSIS — Z8249 Family history of ischemic heart disease and other diseases of the circulatory system: Secondary | ICD-10-CM

## 2023-04-13 DIAGNOSIS — E890 Postprocedural hypothyroidism: Secondary | ICD-10-CM | POA: Diagnosis present

## 2023-04-13 DIAGNOSIS — J9611 Chronic respiratory failure with hypoxia: Secondary | ICD-10-CM | POA: Diagnosis present

## 2023-04-13 DIAGNOSIS — I132 Hypertensive heart and chronic kidney disease with heart failure and with stage 5 chronic kidney disease, or end stage renal disease: Secondary | ICD-10-CM | POA: Diagnosis present

## 2023-04-13 DIAGNOSIS — R072 Precordial pain: Secondary | ICD-10-CM

## 2023-04-13 DIAGNOSIS — I472 Ventricular tachycardia, unspecified: Secondary | ICD-10-CM | POA: Diagnosis present

## 2023-04-13 DIAGNOSIS — Z923 Personal history of irradiation: Secondary | ICD-10-CM

## 2023-04-13 DIAGNOSIS — C3432 Malignant neoplasm of lower lobe, left bronchus or lung: Secondary | ICD-10-CM | POA: Diagnosis present

## 2023-04-13 DIAGNOSIS — I739 Peripheral vascular disease, unspecified: Secondary | ICD-10-CM | POA: Diagnosis present

## 2023-04-13 DIAGNOSIS — I272 Pulmonary hypertension, unspecified: Secondary | ICD-10-CM | POA: Diagnosis present

## 2023-04-13 DIAGNOSIS — Z90710 Acquired absence of both cervix and uterus: Secondary | ICD-10-CM

## 2023-04-13 DIAGNOSIS — I5042 Chronic combined systolic (congestive) and diastolic (congestive) heart failure: Secondary | ICD-10-CM | POA: Diagnosis not present

## 2023-04-13 DIAGNOSIS — I350 Nonrheumatic aortic (valve) stenosis: Secondary | ICD-10-CM | POA: Diagnosis present

## 2023-04-13 DIAGNOSIS — G4733 Obstructive sleep apnea (adult) (pediatric): Secondary | ICD-10-CM | POA: Diagnosis present

## 2023-04-13 DIAGNOSIS — E1129 Type 2 diabetes mellitus with other diabetic kidney complication: Secondary | ICD-10-CM | POA: Diagnosis present

## 2023-04-13 DIAGNOSIS — Z833 Family history of diabetes mellitus: Secondary | ICD-10-CM

## 2023-04-13 DIAGNOSIS — K219 Gastro-esophageal reflux disease without esophagitis: Secondary | ICD-10-CM | POA: Diagnosis present

## 2023-04-13 DIAGNOSIS — Z992 Dependence on renal dialysis: Secondary | ICD-10-CM

## 2023-04-13 DIAGNOSIS — D631 Anemia in chronic kidney disease: Secondary | ICD-10-CM | POA: Diagnosis present

## 2023-04-13 DIAGNOSIS — Z7951 Long term (current) use of inhaled steroids: Secondary | ICD-10-CM

## 2023-04-13 DIAGNOSIS — J4489 Other specified chronic obstructive pulmonary disease: Secondary | ICD-10-CM | POA: Diagnosis present

## 2023-04-13 LAB — GLUCOSE, CAPILLARY: Glucose-Capillary: 208 mg/dL — ABNORMAL HIGH (ref 70–99)

## 2023-04-13 LAB — HEMOGLOBIN A1C
Hgb A1c MFr Bld: 7.1 % — ABNORMAL HIGH (ref 4.8–5.6)
Mean Plasma Glucose: 157.07 mg/dL

## 2023-04-13 LAB — BASIC METABOLIC PANEL
Anion gap: 16 — ABNORMAL HIGH (ref 5–15)
BUN: 35 mg/dL — ABNORMAL HIGH (ref 8–23)
CO2: 27 mmol/L (ref 22–32)
Calcium: 8.1 mg/dL — ABNORMAL LOW (ref 8.9–10.3)
Chloride: 92 mmol/L — ABNORMAL LOW (ref 98–111)
Creatinine, Ser: 5.37 mg/dL — ABNORMAL HIGH (ref 0.44–1.00)
GFR, Estimated: 8 mL/min — ABNORMAL LOW (ref >60)
Glucose, Bld: 166 mg/dL — ABNORMAL HIGH (ref 70–99)
Potassium: 3.8 mmol/L (ref 3.5–5.1)
Sodium: 135 mmol/L (ref 135–145)

## 2023-04-13 LAB — CBC
HCT: 37.1 % (ref 36.0–46.0)
Hemoglobin: 10.7 g/dL — ABNORMAL LOW (ref 12.0–15.0)
MCH: 30.1 pg (ref 26.0–34.0)
MCHC: 28.8 g/dL — ABNORMAL LOW (ref 30.0–36.0)
MCV: 104.5 fL — ABNORMAL HIGH (ref 80.0–100.0)
Platelets: 174 10*3/uL (ref 150–400)
RBC: 3.55 MIL/uL — ABNORMAL LOW (ref 3.87–5.11)
RDW: 16.6 % — ABNORMAL HIGH (ref 11.5–15.5)
WBC: 7.8 10*3/uL (ref 4.0–10.5)
nRBC: 0.4 % — ABNORMAL HIGH (ref 0.0–0.2)

## 2023-04-13 LAB — TROPONIN I (HIGH SENSITIVITY)
Troponin I (High Sensitivity): 109 ng/L (ref <18)
Troponin I (High Sensitivity): 102 ng/L (ref <18)

## 2023-04-13 LAB — LIPID PANEL
Cholesterol: 160 mg/dL (ref 0–200)
HDL: 28 mg/dL — ABNORMAL LOW (ref >40)
LDL Cholesterol: 78 mg/dL (ref 0–99)
Total CHOL/HDL Ratio: 5.7 RATIO
Triglycerides: 271 mg/dL — ABNORMAL HIGH (ref <150)
VLDL: 54 mg/dL — ABNORMAL HIGH (ref 0–40)

## 2023-04-13 MED ORDER — ACETAMINOPHEN 325 MG PO TABS
650.0000 mg | ORAL_TABLET | ORAL | Status: DC | PRN
  Administered 2023-04-13 – 2023-04-16 (×6): 650 mg via ORAL
  Filled 2023-04-13 (×7): qty 2

## 2023-04-13 MED ORDER — NITROGLYCERIN 2 % TD OINT
0.5000 [in_us] | TOPICAL_OINTMENT | Freq: Four times a day (QID) | TRANSDERMAL | Status: DC
  Administered 2023-04-13: 0.5 [in_us] via TOPICAL
  Filled 2023-04-13: qty 1
  Filled 2023-04-13: qty 30

## 2023-04-13 MED ORDER — INSULIN ASPART 100 UNIT/ML IJ SOLN
0.0000 [IU] | Freq: Three times a day (TID) | INTRAMUSCULAR | Status: DC
  Administered 2023-04-14 – 2023-04-15 (×5): 1 [IU] via SUBCUTANEOUS
  Administered 2023-04-15: 2 [IU] via SUBCUTANEOUS
  Administered 2023-04-16: 1 [IU] via SUBCUTANEOUS
  Administered 2023-04-16: 0 [IU] via SUBCUTANEOUS
  Administered 2023-04-17: 1 [IU] via SUBCUTANEOUS

## 2023-04-13 MED ORDER — ASPIRIN 81 MG PO CHEW: 324.0000 mg | CHEWABLE_TABLET | Freq: Once | ORAL | Status: DC

## 2023-04-13 MED ORDER — CARVEDILOL 12.5 MG PO TABS
12.5000 mg | ORAL_TABLET | Freq: Two times a day (BID) | ORAL | Status: DC
  Filled 2023-04-13: qty 1

## 2023-04-13 NOTE — ED Triage Notes (Signed)
Pt BIB [facility] from dialysis, pt was half way done with treatment when she started feeling central chest pain with [facility] and abdominal pain. Pt gets dialysis Tues, Thurs, Sat. 324 aspirin and 0.4 nitro given PTA.   BP 140/70 HR 80  CBG 175

## 2023-04-13 NOTE — ED Provider Notes (Signed)
[place] EMERGENCY DEPARTMENT AT [facility] Provider Note   CSN: [id number] Arrival date & time: 04/13/23  0945     History  Chief Complaint  Patient presents with   Chest Pain   Shortness of Breath    [name] [name] is a 72 y.o. female.   Chest Pain Associated symptoms: shortness of breath   Shortness of Breath Associated symptoms: chest pain      Patient with history of chronic renal failure on dialysis, lung cancer, reflux, hyperlipidemia, TIA, chronic pain syndrome, COPD, CHF diabetes, hypertension.  Patient states she started having chest pain today while she was at dialysis.  She did travel last week.  She ran out of oxygen on the way home but was able to restart oxygen when she arrived home.  She has been on her oxygen for the last 5 days now.  Patient was receiving dialysis today when she suddenly started having pressure on her chest.  Patient felt like someone standing on her.  Home Medications Prior to Admission medications   Medication Sig Start Date End Date Taking? Authorizing Provider  acetaminophen (TYLENOL) 500 MG tablet Take 1,000 mg by mouth 3 (three) times daily.    [provider]  albuterol (PROVENTIL) (2.5 MG/3ML) 0.083% nebulizer solution Take 3 mLs (2.5 mg total) by nebulization every 6 (six) hours as needed for wheezing or shortness of breath. 12/08/21   Demaio, Alexa, MD  albuterol (VENTOLIN HFA) 108 (90 Base) MCG/ACT inhaler Inhale 2 puffs into the lungs every 6 (six) hours as needed for shortness of breath (COPD). 12/29/21   [provider]  allopurinol (ZYLOPRIM) 100 MG tablet Take 1 tablet (100 mg total) by mouth daily. 03/05/21   Medina-Vargas, Monina C, NP  budesonide-formoterol (SYMBICORT) 160-4.5 MCG/ACT inhaler Inhale 2 puffs into the lungs in the morning and at bedtime.    [provider]  carboxymethylcellulose (REFRESH PLUS) 0.5 % SOLN Place 1 drop into both eyes 3 (three) times daily as needed  (dry/irritated eyes.).    [provider]  carvedilol (COREG) 12.5 MG tablet Take 1 tablet (12.5 mg total) by mouth 2 (two) times daily. 03/05/21   Medina-Vargas, Monina C, NP  cetirizine (ZYRTEC) 10 MG tablet Take 10 mg by mouth 2 (two) times daily. 04/17/22   [provider]  clopidogrel (PLAVIX) 75 MG tablet Take 75 mg by mouth daily.    [provider]  fluticasone (FLONASE) 50 MCG/ACT nasal spray Place 1 spray into both nostrils daily. 06/12/22   [provider]  Insulin Lispro Prot & Lispro (HUMALOG 75/25 MIX) (75-25) 100 UNIT/ML Kwikpen Inject 10-20 Units into the skin See admin instructions. Take 20 units is the morning and 10 units at bedtime 12/29/21   [provider]  levothyroxine (SYNTHROID) 200 MCG tablet Take 1 tablet (200 mcg total) by mouth daily at 6 (six) AM. 03/05/21   Medina-Vargas, Monina C, NP  lidocaine (LIDODERM) 5 % 2 patches every 12 (twelve) hours. 12 hours off 1 patch Each side of lower back 12/14/21   [provider]  lidocaine-prilocaine (EMLA) cream SMARTSIG:sparingly Topical 3 Times a Week 02/13/22   [provider]  magnesium oxide (MAG-OX) 400 (241.3 Mg) MG tablet Take 1 tablet (400 mg total) by mouth 2 (two) times daily. 01/14/20   Rolly Salter, MD  Multiple Vitamins-Iron (MULTIVITAMINS WITH IRON) TABS tablet Take 1 tablet by mouth in the morning.    [provider]  pantoprazole (PROTONIX) 40 MG tablet  Take 1 tablet (40 mg total) by mouth 2 (two) times daily before a meal. 07/10/21   Amin, Loura Halt, MD  sevelamer carbonate (RENVELA) 800 MG tablet Take 1,600 mg by mouth 3 (three) times daily with meals.    [provider]  thiamine (VITAMIN B-1) 100 MG tablet Take 100 mg by mouth in the morning.    [provider]      Allergies    Nebivolol, Zestril [lisinopril], Ace inhibitors, and Morphine and codeine    Review of Systems   Review of Systems  Respiratory:  Positive for  shortness of breath.   Cardiovascular:  Positive for chest pain.    Physical Exam Updated Vital Signs BP 135/67   Pulse 89   Resp 14   SpO2 100%  Physical Exam Vitals and nursing note reviewed.  Constitutional:      Appearance: She is not diaphoretic.  HENT:     Head: Normocephalic and atraumatic.     Right Ear: External ear normal.     Left Ear: External ear normal.  Eyes:     General: No scleral icterus.       Right eye: No discharge.        Left eye: No discharge.     Conjunctiva/sclera: Conjunctivae normal.  Neck:     Trachea: No tracheal deviation.  Cardiovascular:     Rate and Rhythm: Normal rate and regular rhythm.     Heart sounds: Normal heart sounds.  Pulmonary:     Effort: Pulmonary effort is normal. No respiratory distress.     Breath sounds: Normal breath sounds. No stridor. No decreased breath sounds, wheezing or rales.  Abdominal:     General: Bowel sounds are normal. There is no distension.     Palpations: Abdomen is soft.     Tenderness: There is no abdominal tenderness. There is no guarding or rebound.  Musculoskeletal:        General: No tenderness or deformity.     Cervical back: Neck supple.     Right lower leg: Edema present.     Left lower leg: Edema present.  Skin:    General: Skin is warm and dry.     Findings: No rash.  Neurological:     Mental Status: She is alert.     Cranial Nerves: No cranial nerve deficit, dysarthria or facial asymmetry.     Sensory: No sensory deficit.     Motor: Weakness present. No abnormal muscle tone or seizure activity.     Coordination: Coordination normal.     Comments: Lower extremity weakness, patient states she normally uses a wheelchair and a [place]  Psychiatric:        Mood and Affect: Mood normal.     ED Results / Procedures / Treatments   Labs (all labs ordered are listed, but only abnormal results are displayed) Labs Reviewed  BASIC METABOLIC PANEL - Abnormal; Notable for the following  components:      Result Value   Chloride 92 (*)    Glucose, Bld 166 (*)    BUN 35 (*)    Creatinine, Ser 5.37 (*)    Calcium 8.1 (*)    GFR, Estimated 8 (*)    Anion gap 16 (*)    All other components within normal limits  CBC - Abnormal; Notable for the following components:   RBC 3.55 (*)    Hemoglobin 10.7 (*)    MCV 104.5 (*)    MCHC 28.8 (*)  RDW 16.6 (*)    nRBC 0.4 (*)    All other components within normal limits  TROPONIN I (HIGH SENSITIVITY) - Abnormal; Notable for the following components:   Troponin I (High Sensitivity) 109 (*)    All other components within normal limits  TROPONIN I (HIGH SENSITIVITY) - Abnormal; Notable for the following components:   Troponin I (High Sensitivity) 102 (*)    All other components within normal limits    EKG EKG Interpretation Date/Time:  Saturday April 13 2023 10:02:54 EDT Ventricular Rate:  89 PR Interval:  171 QRS Duration:  136 QT Interval:  393 QTC Calculation: 479 R Axis:   -65  Text Interpretation: Sinus rhythm Atrial premature complex Nonspecific IVCD with LAD Nonspecific T abnrm, anterolateral leads Confirmed by Linwood Dibbles [phone]) on 04/13/2023 10:07:30 AM  Radiology DG Chest Portable 1 View  Result Date: 04/13/2023 CLINICAL DATA:  Chest pain shortness of breath. EXAM: PORTABLE CHEST 1 VIEW COMPARISON:  08/08/2022 FINDINGS: The cardio pericardial silhouette is enlarged. There is pulmonary vascular congestion without overt pulmonary edema. Interstitial pulmonary edema towards the bases cannot be excluded. No focal consolidation or substantial pleural effusion evident. Retrocardiac opacity is likely atelectatic. Bones are diffusely demineralized. Telemetry leads overlie the chest. IMPRESSION: 1. Enlargement of the cardiopericardial silhouette with pulmonary vascular congestion. Dependent interstitial edema cannot be excluded. 2. Retrocardiac opacity is likely atelectatic. Electronically Signed   By: Kennith Center M.D.   On:  04/13/2023 12:31    Procedures Procedures    Medications Ordered in ED Medications  aspirin chewable tablet 324 mg (324 mg Oral Not Given 04/13/23 1008)  nitroGLYCERIN (NITROGLYN) 2 % ointment 0.5 inch (0.5 inches Topical Given 04/13/23 1406)    ED Course/ Medical Decision Making/ A&P Clinical Course as of 04/13/23 1430  Sat Apr 13, 2023  1058 CBC normal [JK]  1207 Troponin I (High Sensitivity)(!!) Trop elevated, increased compared to previous [JK]  1338 Troponin I (High Sensitivity)(!!) Troponins elevated but stable. [JK]  1338 Basic metabolic panel(!) Metabolic panel similar to previous [JK]  1338 CBC(!) BC shows stable anemia [JK]  1411 Case discussed with Dr Blake Divine regarding admission [JK]  1429 Case discussed with Dr Jens Som.  WIll see in consultation  [JK]    Clinical Course User Index [JK] Linwood Dibbles, MD             HEART Score: 8                Medical Decision Making Problems Addressed: Chest pain, unspecified type: acute illness or injury that poses a threat to life or bodily functions  Amount and/or Complexity of Data Reviewed Labs: ordered. Decision-making details documented in ED Course. Radiology: ordered and independent interpretation performed.  Risk OTC drugs. Prescription drug management. Decision regarding hospitalization.   Presented to the ED for evaluation of chest pressure after dialysis.  Triage notes indicate complaint abdominal pain.  Denies abdominal pain for me.  No tenderness palpation.  Patient did have elevated troponins.  Slightly increased compared to previous.  She does have chronic kidney disease.  It is possible the troponin elevated is more related to that however she is having some persistent discomfort and is at risk considering her comorbidities.  Patient has a high risk heartScore.  Do not think she is a good candidate for outpatient follow-up.  I will consult with the medical service and cardiology for admission further  evaluation.        Final Clinical Impression(s) / ED Diagnoses  Final diagnoses:  Chest pain, unspecified type    Rx / DC Orders ED Discharge Orders     None         Linwood Dibbles, MD 04/13/23 1430

## 2023-04-13 NOTE — ED Notes (Signed)
ED TO INPATIENT HANDOFF REPORT  ED Nurse Name and Phone #: [phone]  S Name/Age/Gender [name] [name] 72 y.o. female Room/Bed: 033C/033C  Code Status   Code Status: Prior  Home/SNF/Other Home Patient oriented to: self, place, time, and situation Is this baseline? Yes   Triage Complete: Triage complete  Chief Complaint Chest pain [R07.9]  Triage Note Pt BIB [facility] from dialysis, pt was half way done with treatment when she started feeling central chest pain with [facility] and abdominal pain. Pt gets dialysis Tues, Thurs, Sat. 324 aspirin and 0.4 nitro given PTA.   BP 140/70 HR 80  CBG 175     Allergies Allergies  Allergen Reactions   Nebivolol Swelling    Chest pain (reaction to Bystolic)   Zestril [Lisinopril] Swelling and Other (See Comments)   Ace Inhibitors Swelling and Other (See Comments)    Tongue swell   Morphine And Codeine Itching    Level of Care/Admitting Diagnosis ED Disposition     ED Disposition  Admit   Condition  --   Comment  Hospital Area: [facility] [[id number]]  Level of Care: Telemetry Cardiac [[id number]]  May place patient in observation at [facility] or [facility] if equivalent level of care is available:: No  Covid Evaluation: Asymptomatic - no recent exposure (last 10 days) testing not required  Diagnosis: Chest pain [841324]  Admitting Physician: Kathlen Mody [[id number]]  Attending Physician: Kathlen Mody [[id number]]          B Medical/Surgery History Past Medical History:  Diagnosis Date   Anemia    Arthritis    Asthma    Chronic diastolic (congestive) heart failure (HCC)    Chronic kidney disease    Dialysis T-TH-Sat   Chronic pain syndrome    Class 2 obesity due to excess calories with body mass index (BMI) of 36.0 to 36.9 in adult    COPD (chronic obstructive pulmonary disease) (HCC)    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   Full dentures    GERD (gastroesophageal reflux  disease)    Gout    History of hiatal hernia    History of transfusion    Hx MRSA infection    abscess left groin   Hyperlipidemia    Hypertension    Hypothyroid    Intractable nausea and vomiting 02/15/2021   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HCC)    Peripheral vascular disease (HCC)    Sleep apnea    UNABLE TO TOLERATE C PAP   Stress incontinence    TIA (transient ischemic attack)     X2 NO RESIDUAL PROBLEMS   Wears glasses    Past Surgical History:  Procedure Laterality Date   ABDOMINAL HYSTERECTOMY     AV FISTULA PLACEMENT Left 12/01/2021   Procedure: INSERTION OF LEFT ARM ARTERIOVENOUS (AV) GORE-TEX GRAFT;  Surgeon: Nada Libman, MD;  [facility]: [facility] OR;  Service: Vascular;  Laterality: Left;   BACK SURGERY  2020   BASCILIC VEIN TRANSPOSITION Left 04/05/2020   Procedure: BASILIC VEIN TRANSPOSITION FIRST STAGE;  Surgeon: Chuck Hint, MD;  Location: [facility] OR;  Service: Vascular;  Laterality: Left;   BRONCHIAL BIOPSY  01/23/2022   Procedure: BRONCHIAL BIOPSIES;  Surgeon: Josephine Igo, DO;  Location: [facility] ENDOSCOPY;  Service: Pulmonary;;   BRONCHIAL BIOPSY  08/07/2022   Procedure: BRONCHIAL BIOPSIES;  Surgeon: Josephine Igo, DO;  [facility]: [facility] ENDOSCOPY;  Service: Pulmonary;;   BRONCHIAL BRUSHINGS  08/07/2022  Procedure: BRONCHIAL BRUSHINGS;  Surgeon: Josephine Igo, DO;  [facility] ENDOS[facility];  Service: Pulmonary;;   BRONCHIAL NEEDLE ASPIRATION BIOPSY  01/23/2022   Procedure: BRONCHIAL NEEDLE ASPIRATION BIOPSIES;  Surgeon: Josephine Igo, DO;  [facility] ENDOSCOPY;  Service: Pulmonary;;   BRONCHIAL NEEDLE ASPIRATION BIOPSY  08/07/2022   Procedure: BRONCHIAL NEEDLE ASPIRATION BIOPSIES;  Surgeon: Josephine Igo, DO;  [facility] ENDOSCOPY;  Service: Pulmonary;;   COLON SURGERY  1995   DUE TO POLYP   DILATION AND CURETTAGE OF UTERUS     ENDOBRONCHIAL ULTRASOUND  01/23/2022   Procedure: ENDOBRONCHIAL ULTRASOUND;  Surgeon: Josephine Igo, DO;  [facility]: [facility]  ENDOSCOPY;  Service: Pulmonary;;   ENTEROSCOPY N/A 07/10/2021   Procedure: ENTEROSCOPY;  Surgeon: Kerin Salen, MD;  Location: [facility] ENDOSCOPY;  Service: Gastroenterology;  Laterality: N/A;   ESOPHAGOGASTRODUODENOSCOPY N/A 01/25/2021   Procedure: ESOPHAGOGASTRODUODENOSCOPY (EGD);  Surgeon: Vida Rigger, MD;  Location: [facility] ENDOSCOPY;  Service: Endoscopy;  Laterality: N/A;   FINE NEEDLE ASPIRATION  01/23/2022   Procedure: FINE NEEDLE ASPIRATION (FNA) LINEAR;  Surgeon: Josephine Igo, DO;  [facility] ENDOS[facility];  Service: Pulmonary;;   FOREARM FRACTURE SURGERY     Left arm   GIVENS CAPSULE STUDY N/A 07/07/2021   Procedure: GIVENS CAPSULE STUDY;  Surgeon: Vida Rigger, MD;  Location: [facility] ENDOSCOPY;  Service: Endoscopy;  Laterality: N/A;   HERNIA REPAIR     w/ mesh   HOT HEMOSTASIS N/A 07/10/2021   Procedure: HOT HEMOSTASIS (ARGON PLASMA COAGULATION/BICAP);  Surgeon: Kerin Salen, MD;  Location: [facility] ENDOSCOPY;  Service: Gastroenterology;  Laterality: N/A;   INCISION AND DRAINAGE ABSCESS Left 02/04/2013   Procedure: INCISION AND DRAINAGE LEFT BUTTOCK ABSCESS; INCISION AND DRAINAGE LEFT BREAST ABSCESS;  Surgeon: Shelly Rubenstein, MD;  [facility] OR;  Service: General;  Laterality: Left;   INCISION AND DRAINAGE ABSCESS N/A 02/12/2013   Procedure: INCISION AND DEBRIDEMENT BUTTOCK WOUND ;  Surgeon: Wilmon Arms. Corliss Skains, MD;  [facility];  Service: General;  Laterality: N/A;   INCISION AND DRAINAGE ABSCESS N/A 02/14/2013   Procedure: INCISION AND DRAINAGE/DRESSING CHANGE;  Surgeon: Shelly Rubenstein, MD;  [facility] OR;  Service: General;  Laterality: N/A;   INCISION AND DRAINAGE ABSCESS N/A 03/21/2015   Procedure: INCISION AND DRAINAGE PUBIC ABSCESS;  Surgeon: Glenna Fellows, MD;  Location: [facility] ORS;  Service: General;  Laterality: N/A;   INCISION AND DRAINAGE PERIRECTAL ABSCESS Left 02/10/2013   Procedure: IRRIGATION AND DEBRIDEMENT OF BUTTOCK/PERINEAL ABSCESS;  Surgeon: Wilmon Arms. Corliss Skains, MD;  Location: [facility] OR;   Service: General;  Laterality: Left;   INCISION AND DRAINAGE PERIRECTAL ABSCESS N/A 02/16/2013   Procedure: IRRIGATION AND DEBRIDEMENT PERINEAL ABSCESS;  Surgeon: Liz Malady, MD;  [facility];  Service: General;  Laterality: N/A;   IR FLUORO GUIDE CV LINE RIGHT  11/30/2021   IR US GUIDE VASC ACCESS RIGHT  11/30/2021   IRRIGATION AND DEBRIDEMENT ABSCESS N/A 02/06/2013   Procedure: IRRIGATION AND DEBRIDEMENT BUTTOCK ABSCESS AND DRESSING CHANGE;  Surgeon: Shelly Rubenstein, MD;  [facility];  Service: General;  Laterality: N/A;   IRRIGATION AND DEBRIDEMENT ABSCESS Left 02/08/2013   Procedure: IRRIGATION AND DEBRIDEMENT ABSCESS/DRESSING CHANGE;  Surgeon: Cherylynn Ridges, MD;  [facility];  Service: General;  Laterality: Left;   JOINT REPLACEMENT  2010 / 2012    LAPAROSCOPIC CHOLECYSTECTOMY     LEFT HEART CATHETERIZATION WITH CORONARY ANGIOGRAM N/A 07/02/2014   Procedure: LEFT HEART CATHETERIZATION WITH CORONARY ANGIOGRAM;  Surgeon: Runell Gess,  MD;  [facility]: [facility] CATH LAB;  Service: Cardiovascular;  Laterality: N/A;   LOWER EXTREMITY ANGIOGRAM Right 04/20/2016   Procedure: Lower Extremity Angiogram;  Surgeon: Sherren Kerns, MD;  Location: [facility] INVASIVE CV LAB;  Service: Cardiovascular;  Laterality: Right;   MULTIPLE TOOTH EXTRACTIONS     PERIPHERAL VASCULAR CATHETERIZATION N/A 04/20/2016   Procedure: Abdominal Aortogram;  Surgeon: Sherren Kerns, MD;  Location: [facility] INVASIVE CV LAB;  Service: Cardiovascular;  Laterality: N/A;   PERIPHERAL VASCULAR CATHETERIZATION Right 04/20/2016   Procedure: Peripheral Vascular Intervention;  Surgeon: Sherren Kerns, MD;  Location: [facility] INVASIVE CV LAB;  Service: Cardiovascular;  Laterality: Right;  popiteal   RIGHT HEART CATH N/A 10/27/2018   Procedure: RIGHT HEART CATH;  Surgeon: Laurey Morale, MD;  Location: [facility] INVASIVE CV LAB;  Service: Cardiovascular;  Laterality: N/A;   RIGHT HEART CATH N/A 03/20/2019   Procedure: RIGHT HEART CATH;  Surgeon:  Laurey Morale, MD;  Location: [facility] INVASIVE CV LAB;  Service: Cardiovascular;  Laterality: N/A;   THYROIDECTOMY     TOTAL HIP REVISION Right 08/11/2014   Procedure: RIGHT ACETABULAR REVISION;  Surgeon: Loanne Drilling, MD;  Location: [facility] ORS;  Service: Orthopedics;  Laterality: Right;   VASCULAR SURGERY     VIDEO BRONCHOSCOPY WITH ENDOBRONCHIAL ULTRASOUND Bilateral 08/07/2022   Procedure: VIDEO BRONCHOSCOPY WITH ENDOBRONCHIAL ULTRASOUND;  Surgeon: Josephine Igo, DO;  [facility] ENDOS[facility];  Service: Pulmonary;  Laterality: Bilateral;   VIDEO BRONCHOSCOPY WITH RADIAL ENDOBRONCHIAL ULTRASOUND  01/23/2022   Procedure: RADIAL ENDOBRONCHIAL ULTRASOUND;  Surgeon: Josephine Igo, DO;  Location[facility] ENDOS[facility];  Service: Pulmonary;;     A IV Location/Drains/Wounds Patient Lines/Drains/Airways Status     Active Line/Drains/Airways     Name Placement date Placement time Site Days   Fistula / Graft Left Upper arm  04/05/20  1300  Upper arm  1103   Fistula / Graft Left Upper arm Arteriovenous vein graft 12/01/21  1444  Upper arm  498            Intake/Output Last 24 hours No intake or output data in the 24 hours ending 04/13/23 1500  Labs/Imaging Results for orders placed or performed during the hospital encounter of 04/13/23 (from the past 48 hour(s))  Basic metabolic panel     Status: Abnormal   Collection Time: 04/13/23 10:28 AM  Result Value Ref Range   Sodium 135 135 - 145 mmol/L   Potassium 3.8 3.5 - 5.1 mmol/L   Chloride 92 (L) 98 - 111 mmol/L   CO2 27 22 - 32 mmol/L   Glucose, Bld 166 (H) 70 - 99 mg/dL    Comment: Glucose reference range applies only to samples taken after fasting for at least 8 hours.   BUN 35 (H) 8 - 23 mg/dL   Creatinine, Ser 7.82 (H) 0.44 - 1.00 mg/dL   Calcium 8.1 (L) 8.9 - 10.3 mg/dL   GFR, Estimated 8 (L) >60 mL/min    Comment: (NOTE) Calculated using the CKD-EPI Creatinine Equation (2021)    Anion gap 16 (H) 5 - 15    Comment: Performed at [facility], [address] [place], [place] [address]  Troponin I (High Sensitivity)     Status: Abnormal   Collection Time: 04/13/23 10:28 AM  Result Value Ref Range   Troponin I (High Sensitivity) 109 (HH) <18 ng/L    Comment: CRITICAL RESULT CALLED TO, READ BACK BY AND VERIFIED WITH Juan Quam RN AT [phone]  BY D LONG (NOTE) Elevated high sensitivity troponin I (hsTnI) values and significant  changes across serial measurements may suggest ACS but many other  chronic and acute conditions are known to elevate hsTnI results.  Refer to the "Links" section for chest pain algorithms and additional  guidance. Performed at [facility], [address] [place], [place] [address]   CBC     Status: Abnormal   Collection Time: 04/13/23 10:28 AM  Result Value Ref Range   WBC 7.8 4.0 - 10.5 K/uL   RBC 3.55 (L) 3.87 - 5.11 MIL/uL   Hemoglobin 10.7 (L) 12.0 - 15.0 g/dL   HCT 60.4 54.0 - 98.1 %   MCV 104.5 (H) 80.0 - 100.0 fL   MCH 30.1 26.0 - 34.0 pg   MCHC 28.8 (L) 30.0 - 36.0 g/dL   RDW 19.1 (H) 47.8 - 29.5 %   Platelets 174 150 - 400 K/uL   nRBC 0.4 (H) 0.0 - 0.2 %    Comment: Performed at [facility], [address], [place], [place] [address]  Troponin I (High Sensitivity)     Status: Abnormal   Collection Time: 04/13/23 12:07 PM  Result Value Ref Range   Troponin I (High Sensitivity) 102 (HH) <18 ng/L    Comment: CRITICAL VALUE NOTED. VALUE IS CONSISTENT WITH PREVIOUSLY REPORTED/CALLED VALUE (NOTE) Elevated high sensitivity troponin I (hsTnI) values and significant  changes across serial measurements may suggest ACS but many other  chronic and acute conditions are known to elevate hsTnI results.  Refer to the "Links" section for chest pain algorithms and additional  guidance. Performed at [facility], [address] [place], [place] [address]    DG Chest Portable 1 View  Result Date: 04/13/2023 CLINICAL DATA:  Chest pain shortness of breath. EXAM:  PORTABLE CHEST 1 VIEW COMPARISON:  08/08/2022 FINDINGS: The cardio pericardial silhouette is enlarged. There is pulmonary vascular congestion without overt pulmonary edema. Interstitial pulmonary edema towards the bases cannot be excluded. No focal consolidation or substantial pleural effusion evident. Retrocardiac opacity is likely atelectatic. Bones are diffusely demineralized. Telemetry leads overlie the chest. IMPRESSION: 1. Enlargement of the cardiopericardial silhouette with pulmonary vascular congestion. Dependent interstitial edema cannot be excluded. 2. Retrocardiac opacity is likely atelectatic. Electronically Signed   By: Kennith Center M.D.   On: 04/13/2023 12:31    Pending Labs Unresulted Labs (From admission, onward)    None       Vitals/Pain Today's Vitals   04/13/23 1225 04/13/23 1315 04/13/23 1400 04/13/23 1445  BP: 136/69 135/67 124/66 123/66  Pulse: 92 89 91 86  Resp: 20 14 16 13   SpO2: 100% 100% 100% 100%  PainSc:        Isolation Precautions No active isolations  Medications Medications  aspirin chewable tablet 324 mg (324 mg Oral Not Given 04/13/23 1008)  nitroGLYCERIN (NITROGLYN) 2 % ointment 0.5 inch (0.5 inches Topical Given 04/13/23 1406)    Mobility      Focused Assessments Cardiac Assessment Handoff:  Cardiac Rhythm: Normal sinus rhythm Lab Results  Component Value Date   CKTOTAL 70 07/01/2021   CKMB 1.0 08/18/2009   CKMBINDEX 2.5 12/23/2013   TROPONINI 0.09 (HH) 03/17/2019   Lab Results  Component Value Date   DDIMER 2.00 (H) 01/30/2021   Does the Patient currently have chest pain? Yes    R Recommendations: See Admitting Provider Note  Report given to:   Additional Notes:

## 2023-04-13 NOTE — H&P (Signed)
History and Physical    Patient: [name] [name]:[id number] DOB: [birth year] DOA: 04/13/2023 DOS: the patient was seen and examined on 04/13/2023 PCP: Vladimir Crofts, FNP  Patient coming from: Home  Chief Complaint:  Chief Complaint  Patient presents with   Chest Pain   Shortness of Breath   HPI: [name] [name] is a 72 y.o. 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 Patient reports that 2 1/2 hours in the HD session, she started having substernal chest pain, ( she had some chest pressure earlier this morning), at which point the HD was stopped and she was brought to ED for further evaluation. She also reports occasional dyspnea on exertion, is on 2 lit of of [place] oxygen,. She denies any fevers or chills, nausea, vomiting or abdominal pain.  She also reports having chronic leg edema,. She reports headache and dizziness, is hungry and hasn't eaten all day.   ED work up.  She is afebrile, BP of 95/49 mmhg, normalized to 127/58 mmhg.  Labs significant for hemoglobin of 10.7, MCV of 104.5, platelets of 174, 000.  BMP shows creatinine of 5.37, bicarb of 27, K OF 3.8.  Troponin 109, 102.   CXR  Enlargement of the cardiopericardial silhouette with pulmonary vascular congestion. Dependent interstitial edema cannot be excluded. 2. Retrocardiac opacity is likely atelectatic.  EKG shows sinus rhythm with non specific t wave abnormalities in the anterolateral leads.   She was referred to [facility] for admission for chest pain and cardiology consulted.   Review of Systems: As mentioned in the history of present illness. All other systems reviewed and are  negative. Past Medical History:  Diagnosis Date   Anemia    Arthritis    Asthma    Chronic diastolic (congestive) heart failure (HCC)    Chronic kidney disease    Dialysis T-TH-Sat   Chronic pain syndrome    Class 2 obesity due to excess calories with body mass index (BMI) of 36.0 to 36.9 in adult    COPD (chronic obstructive pulmonary disease) (HCC)    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   Full dentures    GERD (gastroesophageal reflux disease)    Gout    History of hiatal hernia    History of transfusion    Hx MRSA infection    abscess left groin   Hyperlipidemia    Hypertension    Hypothyroid    Intractable nausea and vomiting 02/15/2021   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HCC)    Peripheral vascular disease (HCC)    Sleep apnea    UNABLE TO TOLERATE C PAP   Stress incontinence    TIA (transient ischemic attack)     X2 NO RESIDUAL PROBLEMS   Wears glasses    Past Surgical History:  Procedure Laterality Date   ABDOMINAL HYSTERECTOMY     AV FISTULA PLACEMENT Left 12/01/2021   Procedure: INSERTION OF LEFT ARM ARTERIOVENOUS (AV) GORE-TEX GRAFT;  Surgeon: Nada Libman, MD;  Location: [facility] OR;  Service: Vascular;  Laterality: Left;   BACK SURGERY  2020   BASCILIC VEIN TRANSPOSITION Left 04/05/2020   Procedure: BASILIC  VEIN TRANSPOSITION FIRST STAGE;  Surgeon: Chuck Hint, MD;  Location: [facility] OR;  Service: Vascular;  Laterality: Left;   BRONCHIAL BIOPSY  01/23/2022   Procedure: BRONCHIAL BIOPSIES;  Surgeon: Josephine Igo, DO;  Location: [facility] ENDOSCOPY;  Service: Pulmonary;;   BRONCHIAL BIOPSY  08/07/2022   Procedure: BRONCHIAL BIOPSIES;  Surgeon: Josephine Igo, DO;  Location: [facility] ENDOSCOPY;  Service: Pulmonary;;   BRONCHIAL BRUSHINGS  08/07/2022   Procedure: BRONCHIAL BRUSHINGS;  Surgeon: Josephine Igo, DO;  [facility]: [facility]OS[facility];  Service: Pulmonary;;   BRONCHIAL NEEDLE ASPIRATION BIOPSY  01/23/2022   Procedure: BRONCHIAL  NEEDLE ASPIRATION BIOPSIES;  Surgeon: Josephine Igo, DO;  [facility]: [facility]OS[facility];  Service: Pulmonary;;   BRONCHIAL NEEDLE ASPIRATION BIOPSY  08/07/2022   Procedure: BRONCHIAL NEEDLE ASPIRATION BIOPSIES;  Surgeon: Josephine Igo, DO;  [facility]: [facility];  Service: Pulmonary;;   COLON SURGERY  1995   DUE TO POLYP   DILATION AND CURETTAGE OF UTERUS     ENDOBRONCHIAL ULTRASOUND  01/23/2022   Procedure: ENDOBRONCHIAL ULTRASOUND;  Surgeon: Josephine Igo, DO;  [facility]: [facility];  Service: Pulmonary;;   ENTEROSCOPY N/A 07/10/2021   Procedure: ENTEROSCOPY;  Surgeon: Kerin Salen, MD;  Location[facility] ENDOSCOPY;  Service: Gastroenterology;  Laterality: N/A;   ESOPHAGOGASTRODUODENOSCOPY N/A 01/25/2021   Procedure: ESOPHAGOGASTRODUODENOSCOPY (EGD);  Surgeon: Vida Rigger, MD;  Location: [facility] ENDOSCOPY;  Service: Endoscopy;  Laterality: N/A;   FINE NEEDLE ASPIRATION  01/23/2022   Procedure: FINE NEEDLE ASPIRATION (FNA) LINEAR;  Surgeon: Josephine Igo, DO;  [facility];  Service: Pulmonary;;   FOREARM FRACTURE SURGERY     Left arm   GIVENS CAPSULE STUDY N/A 07/07/2021   Procedure: GIVENS CAPSULE STUDY;  Surgeon: Vida Rigger, MD;  Location: [facility] ENDOSCOPY;  Service: Endoscopy;  Laterality: N/A;   HERNIA REPAIR     w/ mesh   HOT HEMOSTASIS N/A 07/10/2021   Procedure: HOT HEMOSTASIS (ARGON PLASMA COAGULATION/BICAP);  Surgeon: Kerin Salen, MD;  Location: [facility] ENDOSCOPY;  Service: Gastroenterology;  Laterality: N/A;   INCISION AND DRAINAGE ABSCESS Left 02/04/2013   Procedure: INCISION AND DRAINAGE LEFT BUTTOCK ABSCESS; INCISION AND DRAINAGE LEFT BREAST ABSCESS;  Surgeon: Shelly Rubenstein, MD;  Location: [facility] OR;  Service: General;  Laterality: Left;   INCISION AND DRAINAGE ABSCESS N/A 02/12/2013   Procedure: INCISION AND DEBRIDEMENT BUTTOCK WOUND ;  Surgeon: Wilmon Arms. Corliss Skains, MD;  Location: [facility] OR;  Service: General;  Laterality: N/A;   INCISION AND DRAINAGE ABSCESS N/A 02/14/2013   Procedure:  INCISION AND DRAINAGE/DRESSING CHANGE;  Surgeon: Shelly Rubenstein, MD;  Location: [facility] OR;  Service: General;  Laterality: N/A;   INCISION AND DRAINAGE ABSCESS N/A 03/21/2015   Procedure: INCISION AND DRAINAGE PUBIC ABSCESS;  Surgeon: Glenna Fellows, MD;  Location: [facility] ORS;  Service: General;  Laterality: N/A;   INCISION AND DRAINAGE PERIRECTAL ABSCESS Left 02/10/2013   Procedure: IRRIGATION AND DEBRIDEMENT OF BUTTOCK/PERINEAL ABSCESS;  Surgeon: Wilmon Arms. Corliss Skains, MD;  [facility]: [facility];  Service: General;  Laterality: Left;   INCISION AND DRAINAGE PERIRECTAL ABSCESS N/A 02/16/2013   Procedure: IRRIGATION AND DEBRIDEMENT PERINEAL ABSCESS;  Surgeon: Liz Malady, MD;  Location: [facility];  Service: General;  Laterality: N/A;   IR FLUORO GUIDE CV LINE RIGHT  11/30/2021   IR US GUIDE VASC ACCESS RIGHT  11/30/2021   IRRIGATION AND DEBRIDEMENT ABSCESS N/A 02/06/2013   Procedure: IRRIGATION AND DEBRIDEMENT BUTTOCK ABSCESS AND DRESSING CHANGE;  Surgeon: Shelly Rubenstein, MD;  [facility];  Service: General;  Laterality: N/A;   IRRIGATION AND DEBRIDEMENT ABSCESS Left 02/08/2013   Procedure: IRRIGATION AND DEBRIDEMENT ABSCESS/DRESSING CHANGE;  Surgeon: Cherylynn Ridges, MD;  Location: [facility];  Service: General;  Laterality: Left;   JOINT REPLACEMENT  2010 / 2012    LAPAROSCOPIC CHOLECYSTECTOMY     LEFT HEART CATHETERIZATION WITH CORONARY ANGIOGRAM N/A 07/02/2014   Procedure: LEFT HEART CATHETERIZATION WITH CORONARY ANGIOGRAM;  Surgeon: Runell Gess, MD;  Location: [facility] CATH LAB;  Service: Cardiovascular;  Laterality: N/A;   LOWER EXTREMITY ANGIOGRAM Right 04/20/2016   Procedure: Lower Extremity Angiogram;  Surgeon: Sherren Kerns, MD;  Location: [facility] INVASIVE CV LAB;  Service: Cardiovascular;  Laterality: Right;   MULTIPLE TOOTH EXTRACTIONS     PERIPHERAL VASCULAR CATHETERIZATION N/A 04/20/2016   Procedure: Abdominal Aortogram;  Surgeon: Sherren Kerns, MD;  Location: [facility] INVASIVE CV LAB;  Service:  Cardiovascular;  Laterality: N/A;   PERIPHERAL VASCULAR CATHETERIZATION Right 04/20/2016   Procedure: Peripheral Vascular Intervention;  Surgeon: Sherren Kerns, MD;  Location: [facility] INVASIVE CV LAB;  Service: Cardiovascular;  Laterality: Right;  popiteal   RIGHT HEART CATH N/A 10/27/2018   Procedure: RIGHT HEART CATH;  Surgeon: Laurey Morale, MD;  Location: [facility] INVASIVE CV LAB;  Service: Cardiovascular;  Laterality: N/A;   RIGHT HEART CATH N/A 03/20/2019   Procedure: RIGHT HEART CATH;  Surgeon: Laurey Morale, MD;  Location: [facility] INVASIVE CV LAB;  Service: Cardiovascular;  Laterality: N/A;   THYROIDECTOMY     TOTAL HIP REVISION Right 08/11/2014   Procedure: RIGHT ACETABULAR REVISION;  Surgeon: Loanne Drilling, MD;  Location: [facility] ORS;  Service: Orthopedics;  Laterality: Right;   VASCULAR SURGERY     VIDEO BRONCHOSCOPY WITH ENDOBRONCHIAL ULTRASOUND Bilateral 08/07/2022   Procedure: VIDEO BRONCHOSCOPY WITH ENDOBRONCHIAL ULTRASOUND;  Surgeon: Josephine Igo, DO;  Location: [facility] ENDOSCOPY;  Service: Pulmonary;  Laterality: Bilateral;   VIDEO BRONCHOSCOPY WITH RADIAL ENDOBRONCHIAL ULTRASOUND  01/23/2022   Procedure: RADIAL ENDOBRONCHIAL ULTRASOUND;  Surgeon: Josephine Igo, DO;  Location: [facility] ENDOSCOPY;  Service: Pulmonary;;   Social History:  reports that she quit smoking about 18 months ago. Her smoking use included cigarettes. She has a 10.00 pack-year smoking history. She has been exposed to tobacco smoke. She has quit using smokeless tobacco. She reports current alcohol use of about 1.0 standard drink of alcohol per week. She reports that she does not use drugs.  Allergies  Allergen Reactions   Nebivolol Swelling    Chest pain (reaction to Bystolic)   Zestril [Lisinopril] Swelling and Other (See Comments)   Ace Inhibitors Swelling and Other (See Comments)    Tongue swell   Morphine And Codeine Itching    Family History  Problem Relation Age of Onset   Diabetes Brother    Cardiomyopathy  Mother    Heart disease Mother    Cancer Father     Prior to Admission medications   Medication Sig Start Date End Date Taking? Authorizing Provider  acetaminophen (TYLENOL) 500 MG tablet Take 1,000 mg by mouth 3 (three) times daily.    [provider]  albuterol (PROVENTIL) (2.5 MG/3ML) 0.083% nebulizer solution Take 3 mLs (2.5 mg total) by nebulization every 6 (six) hours as needed for wheezing or shortness of breath. 12/08/21   Demaio, Alexa, MD  albuterol (VENTOLIN HFA) 108 (90 Base) MCG/ACT inhaler Inhale 2 puffs into the lungs every 6 (six) hours as needed for shortness of breath (COPD). 12/29/21   [provider]  allopurinol (ZYLOPRIM) 100 MG tablet Take 1 tablet (100 mg total) by mouth daily. 03/05/21   Medina-Vargas, Monina C, NP  budesonide-formoterol (SYMBICORT) 160-4.5 MCG/ACT inhaler Inhale 2 puffs into the lungs in the morning and at bedtime.    [provider]  carboxymethylcellulose (REFRESH PLUS) 0.5 % SOLN Place 1 drop into both eyes 3 (three) times daily as needed (dry/irritated eyes.).    [provider]  carvedilol (COREG) 12.5 MG tablet Take 1 tablet (12.5 mg total) by mouth 2 (two) times daily. 03/05/21   Medina-Vargas, Monina C, NP  cetirizine (ZYRTEC) 10 MG tablet Take 10 mg by mouth 2 (two) times daily. 04/17/22   [provider]  clopidogrel (PLAVIX) 75 MG tablet Take 75 mg by mouth daily.    [provider]  fluticasone (FLONASE) 50 MCG/ACT nasal spray Place 1 spray into both nostrils daily. 06/12/22   [provider]  Insulin Lispro Prot & Lispro (HUMALOG 75/25 MIX) (75-25) 100 UNIT/ML Kwikpen Inject 10-20 Units into the skin See admin instructions. Take 20 units is the morning and 10 units at bedtime 12/29/21   [provider]  levothyroxine (SYNTHROID) 200 MCG tablet Take 1 tablet (200 mcg total) by mouth daily at 6 (six) AM. 03/05/21   Medina-Vargas, Monina C, NP  lidocaine (LIDODERM) 5 % 2 patches every  12 (twelve) hours. 12 hours off 1 patch Each side of lower back 12/14/21   [provider]  lidocaine-prilocaine (EMLA) cream SMARTSIG:sparingly Topical 3 Times a Week 02/13/22   [provider]  magnesium oxide (MAG-OX) 400 (241.3 Mg) MG tablet Take 1 tablet (400 mg total) by mouth 2 (two) times daily. 01/14/20   Rolly Salter, MD  Multiple Vitamins-Iron (MULTIVITAMINS WITH IRON) TABS tablet Take 1 tablet by mouth in the morning.    [provider]  pantoprazole (PROTONIX) 40 MG tablet Take 1 tablet (40 mg total) by mouth 2 (two) times daily before a meal. 07/10/21   Amin, Loura Halt, MD  sevelamer carbonate (RENVELA) 800 MG tablet Take 1,600 mg by mouth 3 (three) times daily with meals.    [provider]  thiamine (VITAMIN B-1) 100 MG tablet Take 100 mg by mouth in the morning.    [provider]    Physical Exam: Vitals:   04/13/23 1445 04/13/23 1530 04/13/23 1531 04/13/23 1615  BP: 123/66 (!) 128/52  (!) 127/58  Pulse: 86 90  85  Resp: 13 17  19   Temp:   97.7 F (36.5 C)   TempSrc:   Oral   SpO2: 100% 100%  100%   General exam: ill appearing elderly lady not in distress.  Respiratory system: Diminished air entry at bases, on 2 lit of [place] oxygen, no wheezing heard.  Cardiovascular system: S1 & S2 heard, RRR. No JVD, chronic lower extremity edema present.  Gastrointestinal system: Abdomen is nondistended, soft and nontender. Central nervous system: Alert and oriented. No focal neurological deficits. Extremities: chronic lower extremity edema present.  Skin: No rashes, Psychiatry: Mood & affect appropriate.   Data Reviewed: Results for orders placed or performed during the hospital encounter of 04/13/23 (from the past 24 hour(s))  Basic metabolic panel     Status: Abnormal   Collection Time: 04/13/23 10:28 AM  Result Value Ref Range   Sodium 135 135 - 145 mmol/L   Potassium 3.8 3.5 - 5.1 mmol/L   Chloride 92 (L) 98 - 111 mmol/L   CO2 27  22 - 32 mmol/L  Glucose, Bld 166 (H) 70 - 99 mg/dL   BUN 35 (H) 8 - 23 mg/dL   Creatinine, Ser 1.61 (H) 0.44 - 1.00 mg/dL   Calcium 8.1 (L) 8.9 - 10.3 mg/dL   GFR, Estimated 8 (L) >60 mL/min   Anion gap 16 (H) 5 - 15  Troponin I (High Sensitivity)     Status: Abnormal   Collection Time: 04/13/23 10:28 AM  Result Value Ref Range   Troponin I (High Sensitivity) 109 (HH) <18 ng/L  CBC     Status: Abnormal   Collection Time: 04/13/23 10:28 AM  Result Value Ref Range   WBC 7.8 4.0 - 10.5 K/uL   RBC 3.55 (L) 3.87 - 5.11 MIL/uL   Hemoglobin 10.7 (L) 12.0 - 15.0 g/dL   HCT 09.6 04.5 - 40.9 %   MCV 104.5 (H) 80.0 - 100.0 fL   MCH 30.1 26.0 - 34.0 pg   MCHC 28.8 (L) 30.0 - 36.0 g/dL   RDW 81.1 (H) 91.4 - 78.2 %   Platelets 174 150 - 400 K/uL   nRBC 0.4 (H) 0.0 - 0.2 %  Troponin I (High Sensitivity)     Status: Abnormal   Collection Time: 04/13/23 12:07 PM  Result Value Ref Range   Troponin I (High Sensitivity) 102 (HH) <18 ng/L     Assessment and Plan:  Chest pain/ pressure at rest , not related to activity Improved.  Troponin elevated at 107, 102.  Echocardiogram ordered for further evaluation.  Cardiology consulted.  Continue with aspirin, plavix and statin and coreg.    H/o chronic systolic and diastolic CHF;  Fluid management as per HD.  Last echo from 2021 shows LVef of 40 TO 45%.  Repeat echocardiogram today.    ESRD on HD  TTS schedule AV fistula in the left upper extremity.  Reports compliance to HD.  Was able to do only 2:30/4 hrs today.  Will notify Nephrology.    Asthma/ COPD/ chronic respiratory failure with hypoxic on 2 lit of [place] oxygen at home.  Squamous cell ca of the left lower lobe of the lung S/p radiation treatment  Follows up with Dr Arbutus Ped.   H/o PVD  Continue with aspirin, plavix and statin.    Hypertension:  bP parameters are optimal.    OSA;  Obesity:  Outpatient follow upwith PCP .    Anemia of CKD;  Hemoglobin stable around  10.7.   Insulin dependent DM: Start her on SSI.  Get A1c.    Hypothyroidism:  Resume synthroid.      Advance Care Planning:   Code Status: Full Code  Consults: cardiology.   Family Communication: none at bedside.  Severity of Illness: The appropriate patient status for this patient is OBSERVATION. Observation status is judged to be reasonable and necessary in order to provide the required intensity of service to ensure the patient's safety. The patient's presenting symptoms, physical exam findings, and initial radiographic and laboratory data in the context of their medical condition is felt to place them at decreased risk for further clinical deterioration. Furthermore, it is anticipated that the patient will be medically stable for discharge from the hospital within 2 midnights of admission.   Author: Kathlen Mody, MD 04/13/2023 4:34 PM  For on call review [email or web address].

## 2023-04-13 NOTE — Consult Note (Addendum)
Cardiology Consultation   Patient ID: [name] [name] MRN: [id number]; DOB: 12-23-1950  Admit date: 04/13/2023 Date of Consult: 04/13/2023  PCP:  Vladimir Crofts, FNP   [facility] Providers Cardiologist:  None  Advanced Heart Failure:  Marca Ancona, MD  {    Patient Profile:   [name] [name] is a 72 y.o. female with a hx of hypertension, type 2 diabetes, hyperlipidemia, ESRD on hemodialysis TTS, OSA, tobacco use, COPD, obesity, PAD, chronic systolic and diastolic heart failure, TIA, non-small cell lung cancer s/p curative radiation /in remission, who is being seen 04/13/2023 for the evaluation of chest pain at the request of Dr. Blake Divine.  History of Present Illness:   [name] [name] with above past medical history presented to the ER today complaining chest pain. She felt mid-sternum chest heaviness was worsened during her dialysis (2 hours into the session).  The pain is not radiating. The pain is persistent , started this morning before 7AM and never resolved. She feels slightly SOBs. She felt no change of her pain despite receiving medication at ED. She denied any fever, reports some yellow phlegm cough, no blood in BM. She reports SOB with exertion at home, no exertional chest pain at home with ADLs.  She reports chronic lower extremity edema.  She is compliant with dialysis.  She has quit smoking.  Admission diagnostic today revealed ESRD with creatinine 5.37, BUN 35, EGFR 8.  High sensitive troponin 109 >102.  CBC with hemoglobin 10.7, otherwise unremarkable.  Chest x-ray revealed enlargement of cardiopericardial silhouette with pulmonary vascular congestion, dependent interstitial edema cannot be excluded, retrocardiac opacities likely atelectatic. EKG at 1002am today revealed sinus rhythm 89 bpm, PAC, nonspecific IVCD.   Per chart review, patient historically followed advanced heart failure clinic Dr. Shirlee Latch until 03/2020 for nonischemic cardiomyopathy.  Reportedly she had  normal coronary arteries on cardiac catheterization 06/2014.  She was initially admitted for decompensated heart failure 10/2018 despite high-dose diuretics at home with Lasix 160 mg twice daily and metolazone 5 mg 3 times per week.  Echo 10/21/18 showed LVEF 45-50%, diffuse hypokinesis. RHC 10/27/18 showed mildly elevated filing pressure, mild pulmonary HTN, good cardiac output. She was admitted again for decompensated heart failure 03/2019. Echo showed LVEF 40-45%. RHC after diuresis 03/20/19 showed low filling pressures, no significant CHF. She was admitted for sepsis 2/2 UTI 01/2020, refused dialysis, discharged home on PO lasix, that ultimately changed to torsemide 100mg  BID and metolazone 5mg  daily by nephrology. She was last seen 03/15/20 by AHF clinic , BP was elevated 157/60, not taking amlodipine, not tolerating Bidil due to dizziness and visual change and headache, reports good UOP from diuetic.  Nephrology was managing her diuretic she was felt near needing hemodialysis at the time.  She was continued on Coreg 6.25 mg twice daily for GDMT, otherwise renal disease is limiting GDMT.  She was lost to follow-up with cardiology since 03/15/2020.  Most recent echocardiogram from 11/22/2021 revealed LVEF 40 to 45%, global hypokinesis, mild LVH, grade 1 DD, normal RV, mildly elevated pulmonary artery systolic pressure 35.9 mmHg, severe LAE, mild RAE, trivial MR, mild aortic stenosis with VTI 1.32 cm2, mean gradient 8 mmHg.   Additionally, she has peripheral vascular disease, follows vascular surgery, underwent for basilic vein fistula placement 04/05/2020.  She started dialysis 2023, currently receiving dialysis Tuesday Thursday and Saturday.  She has quit smoking.  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  She completed curative radiotherapy to left lower lung lobe since 09/26/2022.  She is currently under surveillance by .    Past Medical History:   Diagnosis Date   Anemia    Arthritis    Asthma    Chronic diastolic (congestive) heart failure (HCC)    Chronic kidney disease    Dialysis T-TH-Sat   Chronic pain syndrome    Class 2 obesity due to excess calories with body mass index (BMI) of 36.0 to 36.9 in adult    COPD (chronic obstructive pulmonary disease) (HCC)    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   Full dentures    GERD (gastroesophageal reflux disease)    Gout    History of hiatal hernia    History of transfusion    Hx MRSA infection    abscess left groin   Hyperlipidemia    Hypertension    Hypothyroid    Intractable nausea and vomiting 02/15/2021   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HCC)    Peripheral vascular disease (HCC)    Sleep apnea    UNABLE TO TOLERATE C PAP   Stress incontinence    TIA (transient ischemic attack)     X2 NO RESIDUAL PROBLEMS   Wears glasses     Past Surgical History:  Procedure Laterality Date   ABDOMINAL HYSTERECTOMY     AV FISTULA PLACEMENT Left 12/01/2021   Procedure: INSERTION OF LEFT ARM ARTERIOVENOUS (AV) GORE-TEX GRAFT;  Surgeon: Nada Libman, MD;  Location: [facility] OR;  Service: Vascular;  Laterality: Left;   BACK SURGERY  2020   BASCILIC VEIN TRANSPOSITION Left 04/05/2020   Procedure: BASILIC VEIN TRANSPOSITION FIRST STAGE;  Surgeon: Chuck Hint, MD;  Location[facility] OR;  Service: Vascular;  Laterality: Left;   BRONCHIAL BIOPSY  01/23/2022   Procedure: BRONCHIAL BIOPSIES;  Surgeon: Josephine Igo, DO;  Location: [facility] ENDOSCOPY;  Service: Pulmonary;;   BRONCHIAL BIOPSY  08/07/2022   Procedure: BRONCHIAL BIOPSIES;  Surgeon: Josephine Igo, DO;  Location: [facility] ENDOSCOPY;  Service: Pulmonary;;   BRONCHIAL BRUSHINGS  08/07/2022   Procedure: BRONCHIAL BRUSHINGS;  Surgeon: Josephine Igo, DO;  Location: [facility]OS[facility];  Service: Pulmonary;;   BRONCHIAL NEEDLE ASPIRATION BIOPSY  01/23/2022   Procedure: BRONCHIAL NEEDLE ASPIRATION BIOPSIES;   Surgeon: Josephine Igo, DO;  Location: [facility] ENDOS[facility];  Service: Pulmonary;;   BRONCHIAL NEEDLE ASPIRATION BIOPSY  08/07/2022   Procedure: BRONCHIAL NEEDLE ASPIRATION BIOPSIES;  Surgeon: Josephine Igo, DO;  Location: [facility] ENDOS[facility];  Service: Pulmonary;;   COLON SURGERY  1995   DUE TO POLYP   DILATION AND CURETTAGE OF UTERUS     ENDOBRONCHIAL ULTRASOUND  01/23/2022   Procedure: ENDOBRONCHIAL ULTRASOUND;  Surgeon: Josephine Igo, DO;  Location: [facility];  Service: Pulmonary;;   ENTEROSCOPY N/A 07/10/2021   Procedure: ENTEROSCOPY;  Surgeon: Kerin Salen, MD;  Location[facility] ENDOSCOPY;  Service: Gastroenterology;  Laterality: N/A;   ESOPHAGOGASTRODUODENOSCOPY N/A 01/25/2021   Procedure: ESOPHAGOGASTRODUODENOSCOPY (EGD);  Surgeon: Vida Rigger, MD;  Location: [facility] ENDOSCOPY;  Service: Endoscopy;  Laterality: N/A;   FINE NEEDLE ASPIRATION  01/23/2022   Procedure: FINE NEEDLE ASPIRATION (FNA) LINEAR;  Surgeon: Josephine Igo, DO;  Location: [facility]OS[facility];  Service: Pulmonary;;   FOREARM FRACTURE SURGERY     Left arm   GIVENS CAPSULE STUDY N/A 07/07/2021   Procedure: GIVENS CAPSULE STUDY;  Surgeon: Vida Rigger, MD;  Location: [facility] ENDOSCOPY;  Service: Endoscopy;  Laterality: N/A;   HERNIA REPAIR  w/ mesh   HOT HEMOSTASIS N/A 07/10/2021   Procedure: HOT HEMOSTASIS (ARGON PLASMA COAGULATION/BICAP);  Surgeon: Kerin Salen, MD;  Location: [facility] ENDOSCOPY;  Service: Gastroenterology;  Laterality: N/A;   INCISION AND DRAINAGE ABSCESS Left 02/04/2013   Procedure: INCISION AND DRAINAGE LEFT BUTTOCK ABSCESS; INCISION AND DRAINAGE LEFT BREAST ABSCESS;  Surgeon: Shelly Rubenstein, MD;  Location: [facility] OR;  Service: General;  Laterality: Left;   INCISION AND DRAINAGE ABSCESS N/A 02/12/2013   Procedure: INCISION AND DEBRIDEMENT BUTTOCK WOUND ;  Surgeon: Wilmon Arms. Corliss Skains, MD;  Location: [facility] OR;  Service: General;  Laterality: N/A;   INCISION AND DRAINAGE ABSCESS N/A 02/14/2013   Procedure: INCISION AND DRAINAGE/DRESSING  CHANGE;  Surgeon: Shelly Rubenstein, MD;  Location: [facility] OR;  Service: General;  Laterality: N/A;   INCISION AND DRAINAGE ABSCESS N/A 03/21/2015   Procedure: INCISION AND DRAINAGE PUBIC ABSCESS;  Surgeon: Glenna Fellows, MD;  Location: [facility] ORS;  Service: General;  Laterality: N/A;   INCISION AND DRAINAGE PERIRECTAL ABSCESS Left 02/10/2013   Procedure: IRRIGATION AND DEBRIDEMENT OF BUTTOCK/PERINEAL ABSCESS;  Surgeon: Wilmon Arms. Corliss Skains, MD;  Location: [facility] OR;  Service: General;  Laterality: Left;   INCISION AND DRAINAGE PERIRECTAL ABSCESS N/A 02/16/2013   Procedure: IRRIGATION AND DEBRIDEMENT PERINEAL ABSCESS;  Surgeon: Liz Malady, MD;  Location: [facility] OR;  Service: General;  Laterality: N/A;   IR FLUORO GUIDE CV LINE RIGHT  11/30/2021   IR US GUIDE VASC ACCESS RIGHT  11/30/2021   IRRIGATION AND DEBRIDEMENT ABSCESS N/A 02/06/2013   Procedure: IRRIGATION AND DEBRIDEMENT BUTTOCK ABSCESS AND DRESSING CHANGE;  Surgeon: Shelly Rubenstein, MD;  Location: [facility] OR;  Service: General;  Laterality: N/A;   IRRIGATION AND DEBRIDEMENT ABSCESS Left 02/08/2013   Procedure: IRRIGATION AND DEBRIDEMENT ABSCESS/DRESSING CHANGE;  Surgeon: Cherylynn Ridges, MD;  Location: [facility] OR;  Service: General;  Laterality: Left;   JOINT REPLACEMENT  2010 / 2012    LAPAROSCOPIC CHOLECYSTECTOMY     LEFT HEART CATHETERIZATION WITH CORONARY ANGIOGRAM N/A 07/02/2014   Procedure: LEFT HEART CATHETERIZATION WITH CORONARY ANGIOGRAM;  Surgeon: Runell Gess, MD;  Location: [facility] CATH LAB;  Service: Cardiovascular;  Laterality: N/A;   LOWER EXTREMITY ANGIOGRAM Right 04/20/2016   Procedure: Lower Extremity Angiogram;  Surgeon: Sherren Kerns, MD;  Location: [facility] INVASIVE CV LAB;  Service: Cardiovascular;  Laterality: Right;   MULTIPLE TOOTH EXTRACTIONS     PERIPHERAL VASCULAR CATHETERIZATION N/A 04/20/2016   Procedure: Abdominal Aortogram;  Surgeon: Sherren Kerns, MD;  Location: [facility] INVASIVE CV LAB;  Service: Cardiovascular;  Laterality: N/A;    PERIPHERAL VASCULAR CATHETERIZATION Right 04/20/2016   Procedure: Peripheral Vascular Intervention;  Surgeon: Sherren Kerns, MD;  Location: [facility] INVASIVE CV LAB;  Service: Cardiovascular;  Laterality: Right;  popiteal   RIGHT HEART CATH N/A 10/27/2018   Procedure: RIGHT HEART CATH;  Surgeon: Laurey Morale, MD;  Location: [facility] INVASIVE CV LAB;  Service: Cardiovascular;  Laterality: N/A;   RIGHT HEART CATH N/A 03/20/2019   Procedure: RIGHT HEART CATH;  Surgeon: Laurey Morale, MD;  Location: [facility] INVASIVE CV LAB;  Service: Cardiovascular;  Laterality: N/A;   THYROIDECTOMY     TOTAL HIP REVISION Right 08/11/2014   Procedure: RIGHT ACETABULAR REVISION;  Surgeon: Loanne Drilling, MD;  Location: [facility] ORS;  Service: Orthopedics;  Laterality: Right;   VASCULAR SURGERY     VIDEO BRONCHOSCOPY WITH ENDOBRONCHIAL ULTRASOUND Bilateral 08/07/2022   Procedure: VIDEO BRONCHOSCOPY WITH ENDOBRONCHIAL ULTRASOUND;  Surgeon: Josephine Igo, DO;  Location:  [facility] ENDOSCOPY;  Service: Pulmonary;  Laterality: Bilateral;   VIDEO BRONCHOSCOPY WITH RADIAL ENDOBRONCHIAL ULTRASOUND  01/23/2022   Procedure: RADIAL ENDOBRONCHIAL ULTRASOUND;  Surgeon: Josephine Igo, DO;  Location: [facility] ENDOSCOPY;  Service: Pulmonary;;     Home Medications:  Prior to Admission medications   Medication Sig Start Date End Date Taking? Authorizing Provider  acetaminophen (TYLENOL) 500 MG tablet Take 1,000 mg by mouth 3 (three) times daily.    [provider]  albuterol (PROVENTIL) (2.5 MG/3ML) 0.083% nebulizer solution Take 3 mLs (2.5 mg total) by nebulization every 6 (six) hours as needed for wheezing or shortness of breath. 12/08/21   Demaio, Alexa, MD  albuterol (VENTOLIN HFA) 108 (90 Base) MCG/ACT inhaler Inhale 2 puffs into the lungs every 6 (six) hours as needed for shortness of breath (COPD). 12/29/21   [provider]  allopurinol (ZYLOPRIM) 100 MG tablet Take 1 tablet (100 mg total) by mouth daily. 03/05/21   Medina-Vargas,  Monina C, NP  budesonide-formoterol (SYMBICORT) 160-4.5 MCG/ACT inhaler Inhale 2 puffs into the lungs in the morning and at bedtime.    [provider]  carboxymethylcellulose (REFRESH PLUS) 0.5 % SOLN Place 1 drop into both eyes 3 (three) times daily as needed (dry/irritated eyes.).    [provider]  carvedilol (COREG) 12.5 MG tablet Take 1 tablet (12.5 mg total) by mouth 2 (two) times daily. 03/05/21   Medina-Vargas, Monina C, NP  cetirizine (ZYRTEC) 10 MG tablet Take 10 mg by mouth 2 (two) times daily. 04/17/22   [provider]  clopidogrel (PLAVIX) 75 MG tablet Take 75 mg by mouth daily.    [provider]  fluticasone (FLONASE) 50 MCG/ACT nasal spray Place 1 spray into both nostrils daily. 06/12/22   [provider]  Insulin Lispro Prot & Lispro (HUMALOG 75/25 MIX) (75-25) 100 UNIT/ML Kwikpen Inject 10-20 Units into the skin See admin instructions. Take 20 units is the morning and 10 units at bedtime 12/29/21   [provider]  levothyroxine (SYNTHROID) 200 MCG tablet Take 1 tablet (200 mcg total) by mouth daily at 6 (six) AM. 03/05/21   Medina-Vargas, Monina C, NP  lidocaine (LIDODERM) 5 % 2 patches every 12 (twelve) hours. 12 hours off 1 patch Each side of lower back 12/14/21   [provider]  lidocaine-prilocaine (EMLA) cream SMARTSIG:sparingly Topical 3 Times a Week 02/13/22   [provider]  magnesium oxide (MAG-OX) 400 (241.3 Mg) MG tablet Take 1 tablet (400 mg total) by mouth 2 (two) times daily. 01/14/20   Rolly Salter, MD  Multiple Vitamins-Iron (MULTIVITAMINS WITH IRON) TABS tablet Take 1 tablet by mouth in the morning.    [provider]  pantoprazole (PROTONIX) 40 MG tablet Take 1 tablet (40 mg total) by mouth 2 (two) times daily before a meal. 07/10/21   Amin, Loura Halt, MD  sevelamer carbonate (RENVELA) 800 MG tablet Take 1,600 mg by mouth 3 (three) times daily with meals.    [provider]   thiamine (VITAMIN B-1) 100 MG tablet Take 100 mg by mouth in the morning.    [provider]    Inpatient Medications: Scheduled Meds:  aspirin  324 mg Oral Once   nitroGLYCERIN  0.5 inch Topical Q6H   Continuous Infusions:  PRN Meds:   Allergies:    Allergies  Allergen Reactions   Nebivolol Swelling    Chest pain (reaction to Bystolic)   Zestril [Lisinopril] Swelling and Other (See Comments)   Ace Inhibitors  Swelling and Other (See Comments)    Tongue swell   Morphine And Codeine Itching    Social History:   Social History   Socioeconomic History   Marital status: Legally Separated    Spouse name: Not on file   Number of children: 1   Years of education: college   Highest education level: Not on file  Occupational History    Comment: Disabled  Tobacco Use   Smoking status: Former    Packs/day: 0.25    Years: 40.00    Additional pack years: 0.00    Total pack years: 10.00    Types: Cigarettes    Quit date: 10/2021    Years since quitting: 1.5    Passive exposure: Past   Smokeless tobacco: Former  Building services engineer Use: Never used  Substance and Sexual Activity   Alcohol use: Yes    Alcohol/week: 1.0 standard drink of alcohol    Types: 1 Standard drinks or equivalent per week    Comment: OCC   Drug use: No   Sexual activity: Not Currently    Birth control/protection: Surgical    Comment: Hysterectomy  Other Topics Concern   Not on file  Social History Narrative   ** Merged History Encounter **       Patient is separated  and lives at home with her friend. Patient is disabled. Education one year of college Right handed Caffeine two cups daily.     Social Determinants of Health   Financial Resource Strain: Not on file  Food Insecurity: Not on file  Transportation Needs: Not on file  Physical Activity: Not on file  Stress: Not on file  Social Connections: Not on file  Intimate Partner Violence: Not At Risk (03/07/2022)    Humiliation, Afraid, Rape, and Kick questionnaire    Fear of Current or Ex-Partner: No    Emotionally Abused: No    Physically Abused: No    Sexually Abused: No    Family History:    Family History  Problem Relation Age of Onset   Diabetes Brother    Cardiomyopathy Mother    Heart disease Mother    Cancer Father      ROS:  Constitutional: Denied fever, chills, malaise, night sweats Eyes: Denied vision change or loss Ears/Nose/Mouth/Throat: Denied ear ache, sore throat, sinus pain Cardiovascular: see HPI  Respiratory: see HPI  Gastrointestinal: Denied nausea, vomiting, abdominal pain, diarrhea Genital/Urinary: Denied dysuria, hematuria, urinary frequency/urgency Musculoskeletal: Denied muscle ache, joint pain, weakness Skin: Denied rash, wound Neuro: Denied headache, dizziness, syncope Psych: Denied history of depression/anxiety  Endocrine: history of diabetes   Physical Exam/Data:   Vitals:   04/13/23 1130 04/13/23 1200 04/13/23 1225 04/13/23 1315  BP:   136/69 135/67  Pulse: 84 91 92 89  Resp: 20 17 20 14   SpO2: 100% 100% 100% 100%   No intake or output data in the 24 hours ending 04/13/23 1448    12/15/2022   12:10 PM 10/31/2022    1:16 PM 08/08/2022    5:00 AM  Last 3 Weights  Weight (lbs) 210 lb 204 lb 230 lb 2.6 oz  Weight (kg) 95.255 kg 92.534 kg 104.4 kg     There is no height or weight on file to calculate BMI.   Vitals:  Vitals:   04/13/23 1400 04/13/23 1445  BP: 124/66 123/66  Pulse: 91 86  Resp: 16 13  SpO2: 100% 100%   General Appearance: In no apparent distress, laying in  bed, well nourished  HEENT: Normocephalic, atraumatic.  Neck: Supple, trachea midline, no JVDs Cardiovascular: Regular rate and rhythm, normal S1-S2, no murmur  Respiratory: Resting breathing unlabored, lungs sounds clear to auscultation bilaterally, no use of accessory muscles. On [place] oxygen.  Speaks full sentences Gastrointestinal: Bowel sounds positive, abdomen soft,  non-tender, non-distended.  Extremities: Able to move all extremities in bed without difficulty, 1+ edema bilaterally  Musculoskeletal: Normal muscle bulk and tone Skin: Intact, warm, dry. No rashes or petechiae noted in exposed areas.  Neurologic: Alert, oriented to person, place and time. Fluent speech, no cognitive deficit, no gross focal neuro deficit Psychiatric: Normal affect. Mood is appropriate.    EKG:  The EKG was personally reviewed and demonstrates:   EKG today at 10:02 AM shows sinus rhythm 89 bpm, PAC, nonspecific IVCD  Telemetry:  Telemetry was personally reviewed and demonstrates:   Sinus rhythm, NSVT up to 5 beats  Relevant CV Studies:  Echocardiogram from 11/22/2021:   1. Left ventricular ejection fraction, by estimation, is 40 to 45%. The  left ventricle has mildly decreased function. The left ventricle  demonstrates global hypokinesis. The left ventricular internal cavity size  was mildly dilated. There is mild left  ventricular hypertrophy. Left ventricular diastolic parameters are  consistent with Grade I diastolic dysfunction (impaired relaxation).   2. Right ventricular systolic function is normal. The right ventricular  size is normal. There is mildly elevated pulmonary artery systolic  pressure.   3. Left atrial size was severely dilated.   4. Right atrial size was mildly dilated.   5. The mitral valve is normal in structure. Trivial mitral valve  regurgitation. No evidence of mitral stenosis.   6. The aortic valve was not well visualized. There is mild calcification  of the aortic valve. Aortic valve regurgitation is not visualized. Mild  aortic valve stenosis. Aortic valve area, by VTI measures 1.32 cm. Aortic  valve mean gradient measures 8.0  mmHg. Aortic valve Vmax measures 2.05 m/s.   7. The inferior vena cava is dilated in size with >50% respiratory  variability, suggesting right atrial pressure of 8 mmHg.    RHC 03/20/2019:  Right Heart  Pressures RHC Procedural Findings: Hemodynamics (mmHg) RA mean 5 RV 25/1 PA 32/9, mean 17 PCWP mean 8  Oxygen saturations: PA 61% AO 96%  Cardiac Output (Fick) 5.61  Cardiac Index (Fick) 2.61    Laboratory Data:  High Sensitivity Troponin:   Recent Labs  Lab 04/13/23 1028 04/13/23 1207  TROPONINIHS 109* 102*     Chemistry Recent Labs  Lab 04/13/23 1028  NA 135  K 3.8  CL 92*  CO2 27  GLUCOSE 166*  BUN 35*  CREATININE 5.37*  CALCIUM 8.1*  GFRNONAA 8*  ANIONGAP 16*    No results for input(s): "PROT", "ALBUMIN", "AST", "ALT", "ALKPHOS", "BILITOT" in the last 168 hours. Lipids No results for input(s): "CHOL", "TRIG", "HDL", "LABVLDL", "LDLCALC", "CHOLHDL" in the last 168 hours.  Hematology Recent Labs  Lab 04/13/23 1028  WBC 7.8  RBC 3.55*  HGB 10.7*  HCT 37.1  MCV 104.5*  MCH 30.1  MCHC 28.8*  RDW 16.6*  PLT 174   Thyroid No results for input(s): "TSH", "FREET4" in the last 168 hours.  BNPNo results for input(s): "BNP", "PROBNP" in the last 168 hours.  DDimer No results for input(s): "DDIMER" in the last 168 hours.   Radiology/Studies:  DG Chest Portable 1 View  Result Date: 04/13/2023 CLINICAL DATA:  Chest pain shortness of breath.  EXAM: PORTABLE CHEST 1 VIEW COMPARISON:  08/08/2022 FINDINGS: The cardio pericardial silhouette is enlarged. There is pulmonary vascular congestion without overt pulmonary edema. Interstitial pulmonary edema towards the bases cannot be excluded. No focal consolidation or substantial pleural effusion evident. Retrocardiac opacity is likely atelectatic. Bones are diffusely demineralized. Telemetry leads overlie the chest. IMPRESSION: 1. Enlargement of the cardiopericardial silhouette with pulmonary vascular congestion. Dependent interstitial edema cannot be excluded. 2. Retrocardiac opacity is likely atelectatic. Electronically Signed   By: Kennith Center M.D.   On: 04/13/2023 12:31     Assessment and Plan:   Chest  pain Elevated troponin -Persistent chest pain since 7 AM, atypical in features -High sensitive troponin 109>102 -EKG without acute ischemic change -Will check echocardiogram -Suspect decreased renal clearance, not ACS -Cath from 2015 revealed clean coronary arteries -If echo demonstrate no significant change, outpatient stress Myoview may be considered for risk stratification -Continue PTA Plavix and Coreg; will check lipid panel and A1c  Chronic systolic and diastolic heart failure Nonischemic cardiomyopathy Aortic stenosis  -Last echo from 11/22/2021 with LVEF 40 to 45%, grade 1 DD, normal RV, mild pulmonary hypertension, severe LAE, mild RAE, trivial MR, mild AS with VTI 1.32 cm2 and mean gradient 8 mmHg -Will repeat echocardiogram this admission for update -Clinically she appears euvolemic -Continue dialysis for volume management, per nephrology -GDMT: She had historically not tolerating BiDil due to dizziness, headache, visual change; had refused amlodipine in the past; renal disease is limiting GDMT; continue PTA Coreg 12.5 mg twice daily  HTN - BP well controlled at this time , continue PTA coreg   ESRD Type 2 diabetes COPD OSA Obesity  History of non-small cell lung cancer History of TIA -Per primary team   Risk Assessment/Risk Scores:    New York Heart Association (NYHA) Functional Class NYHA Class II        For questions or updates, please contact [facility] Please consult [email or web address] for contact info under   Signed, Cyndi Bender, NP  04/13/2023 2:48 PM  As above, patient seen and examined.  Briefly she is a 72 year old female with past medical history of diabetes mellitus, hypertension, hyperlipidemia, end-stage renal disease dialysis dependent, COPD, peripheral vascular disease, prior TIA, non-small cell lung cancer status post radiation for evaluation of chest pain.  Patient did have a cardiac catheterization in 2015 showing no significant coronary  disease.  Last echocardiogram February 2023 showed ejection fraction 40 to 45%, mild left ventricular enlargement, grade 1 diastolic dysfunction, severe left atrial enlargement, mild right atrial enlargement, interpreted as mild aortic stenosis with mean gradient 8 mmHg and aortic valve area 1.3 cm.  Patient does have dyspnea on exertion chronically.  She states that she developed chest pressure this morning prior to going to dialysis.  The pain did not radiate.  It was not pleuritic or positional.  It has been persistent without complete resolution.  It worsened during dialysis and she was sent to the emergency room.  Pain persist at time of my evaluation.  She denies nausea, diaphoresis but she has had some dyspnea.  Cardiology now asked to evaluate.  Chest x-ray shows cardiac enlargement and vascular congestion.  Creatinine is 5.37, hemoglobin 10.7, troponin is 109 and 102.  Electrocardiogram shows normal sinus rhythm with PACs and PVCs, left axis deviation and nonspecific ST changes.  1 chest pain-symptoms are atypical.  They have been persistent since early this morning.  Troponin is minimally elevated but no clear trend and not consistent with acute coronary  syndrome.  Likely in the setting of renal insufficiency.  Would continue to cycle.  Would arrange echocardiogram to assess LV function.  If follow-up enzymes unchanged and echocardiogram continues to show ejection fraction 40 to 45% would likely plan outpatient Lexiscan nuclear study for risk stratification.  2 history of mild LV dysfunction-would repeat echocardiogram.  There was also question of aortic stenosis.  However she does not have a murmur on examination.  Will reassess.  3 end-stage renal disease-follow-up nephrology as an outpatient.  She is dialyzed on Tuesdays Thursdays and Saturdays.  4 hypertension-continue preadmission blood pressure medications and follow-up.  5 history of lung cancer-no recurrence based on most recent office  visit.  Olga Millers, MD

## 2023-04-14 DIAGNOSIS — R079 Chest pain, unspecified: Secondary | ICD-10-CM | POA: Diagnosis not present

## 2023-04-14 DIAGNOSIS — R072 Precordial pain: Secondary | ICD-10-CM | POA: Diagnosis not present

## 2023-04-14 DIAGNOSIS — I5042 Chronic combined systolic (congestive) and diastolic (congestive) heart failure: Secondary | ICD-10-CM | POA: Diagnosis not present

## 2023-04-14 LAB — CBC WITH DIFFERENTIAL/PLATELET
Abs Immature Granulocytes: 0.04 10*3/uL (ref 0.00–0.07)
Basophils Absolute: 0 10*3/uL (ref 0.0–0.1)
Basophils Relative: 0 %
Eosinophils Absolute: 0.1 10*3/uL (ref 0.0–0.5)
Eosinophils Relative: 1 %
HCT: 34.4 % — ABNORMAL LOW (ref 36.0–46.0)
Hemoglobin: 9.9 g/dL — ABNORMAL LOW (ref 12.0–15.0)
Immature Granulocytes: 1 %
Lymphocytes Relative: 8 %
Lymphs Abs: 0.6 10*3/uL — ABNORMAL LOW (ref 0.7–4.0)
MCH: 30.5 pg (ref 26.0–34.0)
MCHC: 28.8 g/dL — ABNORMAL LOW (ref 30.0–36.0)
MCV: 105.8 fL — ABNORMAL HIGH (ref 80.0–100.0)
Monocytes Absolute: 0.6 10*3/uL (ref 0.1–1.0)
Monocytes Relative: 8 %
Neutro Abs: 5.8 10*3/uL (ref 1.7–7.7)
Neutrophils Relative %: 82 %
Platelets: 148 10*3/uL — ABNORMAL LOW (ref 150–400)
RBC: 3.25 MIL/uL — ABNORMAL LOW (ref 3.87–5.11)
RDW: 16.4 % — ABNORMAL HIGH (ref 11.5–15.5)
WBC: 7.1 10*3/uL (ref 4.0–10.5)
nRBC: 0.6 % — ABNORMAL HIGH (ref 0.0–0.2)

## 2023-04-14 LAB — GLUCOSE, CAPILLARY
Glucose-Capillary: 166 mg/dL — ABNORMAL HIGH (ref 70–99)
Glucose-Capillary: 172 mg/dL — ABNORMAL HIGH (ref 70–99)
Glucose-Capillary: 200 mg/dL — ABNORMAL HIGH (ref 70–99)
Glucose-Capillary: 195 mg/dL — ABNORMAL HIGH (ref 70–99)

## 2023-04-14 LAB — BASIC METABOLIC PANEL
Anion gap: 11 (ref 5–15)
BUN: 39 mg/dL — ABNORMAL HIGH (ref 8–23)
CO2: 26 mmol/L (ref 22–32)
Calcium: 8.3 mg/dL — ABNORMAL LOW (ref 8.9–10.3)
Chloride: 97 mmol/L — ABNORMAL LOW (ref 98–111)
Creatinine, Ser: 6.38 mg/dL — ABNORMAL HIGH (ref 0.44–1.00)
GFR, Estimated: 6 mL/min — ABNORMAL LOW (ref >60)
Glucose, Bld: 241 mg/dL — ABNORMAL HIGH (ref 70–99)
Potassium: 4.2 mmol/L (ref 3.5–5.1)
Sodium: 134 mmol/L — ABNORMAL LOW (ref 135–145)

## 2023-04-14 MED ORDER — CARVEDILOL 6.25 MG PO TABS
6.2500 mg | ORAL_TABLET | Freq: Two times a day (BID) | ORAL | Status: DC
  Administered 2023-04-14 – 2023-04-17 (×5): 6.25 mg via ORAL
  Filled 2023-04-14 (×5): qty 1

## 2023-04-14 MED ORDER — ALLOPURINOL 100 MG PO TABS
100.0000 mg | ORAL_TABLET | Freq: Every day | ORAL | Status: DC
  Administered 2023-04-14 – 2023-04-17 (×4): 100 mg via ORAL
  Filled 2023-04-14 (×4): qty 1

## 2023-04-14 MED ORDER — FLUTICASONE PROPIONATE 50 MCG/ACT NA SUSP
1.0000 | Freq: Every day | NASAL | Status: DC
  Administered 2023-04-15 – 2023-04-17 (×3): 1 via NASAL
  Filled 2023-04-14: qty 16

## 2023-04-14 MED ORDER — LEVOTHYROXINE SODIUM 100 MCG PO TABS
200.0000 ug | ORAL_TABLET | Freq: Every day | ORAL | Status: DC
  Administered 2023-04-14 – 2023-04-16 (×3): 200 ug via ORAL
  Filled 2023-04-14 (×3): qty 2

## 2023-04-14 MED ORDER — CLOPIDOGREL BISULFATE 75 MG PO TABS
75.0000 mg | ORAL_TABLET | Freq: Every day | ORAL | Status: DC
  Administered 2023-04-14 – 2023-04-17 (×4): 75 mg via ORAL
  Filled 2023-04-14 (×4): qty 1

## 2023-04-14 MED ORDER — ALBUTEROL SULFATE (2.5 MG/3ML) 0.083% IN NEBU: 2.5000 mg | INHALATION_SOLUTION | Freq: Four times a day (QID) | RESPIRATORY_TRACT | Status: DC | PRN

## 2023-04-14 MED ORDER — ACETAMINOPHEN 500 MG PO TABS: 1000.0000 mg | ORAL_TABLET | Freq: Three times a day (TID) | ORAL | Status: DC

## 2023-04-14 MED ORDER — MOMETASONE FURO-FORMOTEROL FUM 200-5 MCG/ACT IN AERO
2.0000 | INHALATION_SPRAY | Freq: Two times a day (BID) | RESPIRATORY_TRACT | Status: DC
  Administered 2023-04-14 – 2023-04-17 (×5): 2 via RESPIRATORY_TRACT
  Filled 2023-04-14 (×2): qty 8.8

## 2023-04-14 MED ORDER — PANTOPRAZOLE SODIUM 40 MG PO TBEC
40.0000 mg | DELAYED_RELEASE_TABLET | Freq: Two times a day (BID) | ORAL | Status: DC
  Administered 2023-04-14 – 2023-04-17 (×7): 40 mg via ORAL
  Filled 2023-04-14 (×7): qty 1

## 2023-04-14 MED ORDER — SEVELAMER CARBONATE 800 MG PO TABS
1600.0000 mg | ORAL_TABLET | Freq: Three times a day (TID) | ORAL | Status: DC
  Administered 2023-04-14 – 2023-04-17 (×11): 1600 mg via ORAL
  Filled 2023-04-14 (×11): qty 2

## 2023-04-14 MED ORDER — HEPARIN SODIUM (PORCINE) 5000 UNIT/ML IJ SOLN
5000.0000 [IU] | Freq: Three times a day (TID) | INTRAMUSCULAR | Status: DC
  Administered 2023-04-14 – 2023-04-17 (×9): 5000 [IU] via SUBCUTANEOUS
  Filled 2023-04-14 (×9): qty 1

## 2023-04-14 NOTE — Care Management Obs Status (Signed)
MEDICARE OBSERVATION STATUS NOTIFICATION   Patient Details  Name: [name] [name] MRN: [id number] Date of Birth: 14-Jan-1951   Medicare Observation Status Notification Given:  Yes    Lawerance Sabal, RN 04/14/2023, 4:10 PM

## 2023-04-14 NOTE — Progress Notes (Signed)
PROGRESS NOTE    [name] [name]  ZOX:[id number] DOB: 17-Jan-1951 DOA: 04/13/2023 PCP: Vladimir Crofts, FNP   Brief Narrative:    [name] [name] is a 72 y.o. 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 Patient reports that 2 1/2 hours in the HD session, she started having substernal chest pain, ( she had some chest pressure earlier this morning), at which point the HD was stopped and she was brought to ED for further evaluation. Cardiology following and 2D echo pending.  Assessment & Plan:   Principal Problem:   Chest pain Active Problems:   PAD (peripheral artery disease) (HCC)   Class 2 obesity due to excess calories with body mass index (BMI) of 36.0 to 36.9 in adult   Hyperlipidemia   COPD with asthma   Hypothyroidism   OSA (obstructive sleep apnea)   DM (diabetes mellitus), type 2 with renal complications (HCC)   Anemia, unspecified   ESRD (end stage renal disease) on dialysis (HCC)   Lung cancer, lower lobe (HCC)  Assessment and Plan:   Chest pain/ pressure at rest , not related to activity Improved.  Troponin elevated at 107, 102.  Echocardiogram ordered for further evaluation-still pending Cardiology consulted.  Continue with aspirin, plavix and statin and coreg.      H/o chronic systolic and diastolic CHF;  Fluid management as per HD.  Last echo from 2021 shows LVef of 40 TO 45%.  Repeat echocardiogram today.      ESRD on HD  TTS schedule;no current need for HD AV fistula in the left upper extremity.  Reports compliance to HD.  Was able to do only 2:30/4 hrs today.  Plan for routine HD on Tues 7/9     Asthma/ COPD/ chronic  respiratory failure with hypoxic on 2 lit of [place] oxygen at home.  Squamous cell ca of the left lower lobe of the lung S/p radiation treatment  Follows up with Dr Arbutus Ped.    H/o PVD  Continue with aspirin, plavix and statin.      Hypertension:  bP parameters are optimal.      OSA;  Obesity:  Outpatient follow upwith PCP .      Anemia of CKD;  Hemoglobin stable around 10.7.    Insulin dependent DM: Start her on SSI.  Get A1c.      Hypothyroidism:  Resume synthroid.    Morbid obesity BMI 40.15   DVT prophylaxis:Heparin Code Status: Full Family Communication: None at bedside Disposition Plan:  Status is: Observation The patient will require care spanning > 2 midnights and should be moved to inpatient because: Need for IV medications   Consultants:  Cardiology  Procedures:  None  Antimicrobials:  None   Subjective: Patient seen and evaluated today with no new acute complaints or concerns. No acute concerns or events noted overnight. She states she has mild chest tightness and no dyspnea.  Objective: Vitals:   04/14/23 0737 04/14/23 1106 04/14/23 1536 04/14/23 1655  BP: 102/83 (!) 139/59 (!) 152/67 (!) 154/71  Pulse: 98 88 97 100  Resp: 20 19 19    Temp: 98 F (36.7 C) 98.2 F (36.8 C) 98.6 F (  37 C)   TempSrc: Oral Oral Oral   SpO2: 95% 95% 97%   Weight:      Height:       No intake or output data in the 24 hours ending 04/14/23 1711 Filed Weights   04/13/23 1805  Weight: 102.8 kg    Examination:  General exam: Appears calm and comfortable, morbidly obese Respiratory system: Clear to auscultation. Respiratory effort normal. 3L [place] Cardiovascular system: S1 & S2 heard, RRR.  Gastrointestinal system: Abdomen is soft Central nervous system: Alert and awake Extremities: No edema Skin: No significant lesions noted Psychiatry: Flat affect.    Data Reviewed: I have personally reviewed following labs and imaging studies  CBC: Recent Labs   Lab 04/13/23 1028 04/14/23 0035  WBC 7.8 7.1  NEUTROABS  --  5.8  HGB 10.7* 9.9*  HCT 37.1 34.4*  MCV 104.5* 105.8*  PLT 174 148*   Basic Metabolic Panel: Recent Labs  Lab 04/13/23 1028 04/14/23 0035  NA 135 134*  K 3.8 4.2  CL 92* 97*  CO2 27 26  GLUCOSE 166* 241*  BUN 35* 39*  CREATININE 5.37* 6.38*  CALCIUM 8.1* 8.3*   GFR: Estimated Creatinine Clearance: 9.1 mL/min (A) (by C-G formula based on SCr of 6.38 mg/dL (H)). Liver Function Tests: No results for input(s): "AST", "ALT", "ALKPHOS", "BILITOT", "PROT", "ALBUMIN" in the last 168 hours. No results for input(s): "LIPASE", "AMYLASE" in the last 168 hours. No results for input(s): "AMMONIA" in the last 168 hours. Coagulation Profile: No results for input(s): "INR", "PROTIME" in the last 168 hours. Cardiac Enzymes: No results for input(s): "CKTOTAL", "CKMB", "CKMBINDEX", "TROPONINI" in the last 168 hours. BNP (last 3 results) No results for input(s): "PROBNP" in the last 8760 hours. HbA1C: Recent Labs    04/13/23 1858  HGBA1C 7.1*   CBG: Recent Labs  Lab 04/13/23 2142 04/14/23 0608 04/14/23 1108 04/14/23 1539  GLUCAP 208* 166* 172* 200*   Lipid Profile: Recent Labs    04/13/23 1858  CHOL 160  HDL 28*  LDLCALC 78  TRIG 161*  CHOLHDL 5.7   Thyroid Function Tests: No results for input(s): "TSH", "T4TOTAL", "FREET4", "T3FREE", "THYROIDAB" in the last 72 hours. Anemia Panel: No results for input(s): "VITAMINB12", "FOLATE", "FERRITIN", "TIBC", "IRON", "RETICCTPCT" in the last 72 hours. Sepsis Labs: No results for input(s): "PROCALCITON", "LATICACIDVEN" in the last 168 hours.  No results found for this or any previous visit (from the past 240 hour(s)).       Radiology Studies: DG Chest Portable 1 View  Result Date: 04/13/2023 CLINICAL DATA:  Chest pain shortness of breath. EXAM: PORTABLE CHEST 1 VIEW COMPARISON:  08/08/2022 FINDINGS: The cardio pericardial silhouette is enlarged. There is  pulmonary vascular congestion without overt pulmonary edema. Interstitial pulmonary edema towards the bases cannot be excluded. No focal consolidation or substantial pleural effusion evident. Retrocardiac opacity is likely atelectatic. Bones are diffusely demineralized. Telemetry leads overlie the chest. IMPRESSION: 1. Enlargement of the cardiopericardial silhouette with pulmonary vascular congestion. Dependent interstitial edema cannot be excluded. 2. Retrocardiac opacity is likely atelectatic. Electronically Signed   By: Kennith Center M.D.   On: 04/13/2023 12:31        Scheduled Meds:  allopurinol  100 mg Oral Daily   aspirin  324 mg Oral Once   carvedilol  6.25 mg Oral BID WC   clopidogrel  75 mg Oral Daily   fluticasone  1 spray Each Nare Daily   heparin  5,000 Units Subcutaneous Q8H  insulin aspart  0-6 Units Subcutaneous TID WC   levothyroxine  200 mcg Oral Q0600   mometasone-formoterol  2 puff Inhalation BID   pantoprazole  40 mg Oral BID AC   sevelamer carbonate  1,600 mg Oral TID WC     LOS: 0 days    Time spent: 35 minutes    Lavalle Skoda Hoover Brunette, DO [facility]  If 7PM-7AM, please contact night-coverage [email or web address] 04/14/2023, 5:11 PM

## 2023-04-14 NOTE — Progress Notes (Signed)
Rounding Note    Patient Name: [name] [name] Date of Encounter: 04/14/2023  [facility] Cardiologist: Dr Allyson Sabal  Subjective   Mild chest tightness, no dyspnea, headache  Inpatient Medications    Scheduled Meds:  allopurinol  100 mg Oral Daily   aspirin  324 mg Oral Once   carvedilol  12.5 mg Oral BID WC   clopidogrel  75 mg Oral Daily   fluticasone  1 spray Each Nare Daily   heparin  5,000 Units Subcutaneous Q8H   insulin aspart  0-6 Units Subcutaneous TID WC   levothyroxine  200 mcg Oral Q0600   mometasone-formoterol  2 puff Inhalation BID   nitroGLYCERIN  0.5 inch Topical Q6H   pantoprazole  40 mg Oral BID AC   sevelamer carbonate  1,600 mg Oral TID WC   Continuous Infusions:  PRN Meds: acetaminophen, albuterol   Vital Signs    Vitals:   04/14/23 0036 04/14/23 0405 04/14/23 0626 04/14/23 0737  BP: (!) 106/42 (!) 99/46 (!) 101/54 102/83  Pulse: 93 89 93 98  Resp: 16 11 16 20   Temp:  97.8 F (36.6 C) 97.7 F (36.5 C) 98 F (36.7 C)  TempSrc:  Oral Oral Oral  SpO2: 97% 97% 97% 95%  Weight:      Height:       No intake or output data in the 24 hours ending 04/14/23 0818    04/13/2023    6:05 PM 12/15/2022   12:10 PM 10/31/2022    1:16 PM  Last 3 Weights  Weight (lbs) 226 lb 10.1 oz 210 lb 204 lb  Weight (kg) 102.8 kg 95.255 kg 92.534 kg      Telemetry    Sinus with PVCs and 3 beats nonsustained ventricular tachycardia- Personally Reviewed  Physical Exam   GEN: No acute distress.   Neck: No JVD Cardiac: RRR, no murmurs, rubs, or gallops.  Respiratory: Diminished breath sounds bases GI: Soft, nontender, non-distended  MS: trace edema Neuro:  Nonfocal  Psych: Normal affect   Labs    High Sensitivity Troponin:   Recent Labs  Lab 04/13/23 1028 04/13/23 1207  TROPONINIHS 109* 102*     Chemistry Recent Labs  Lab 04/13/23 1028 04/14/23 0035  NA 135 134*  K 3.8 4.2  CL 92* 97*  CO2 27 26  GLUCOSE 166* 241*  BUN 35* 39*   CREATININE 5.37* 6.38*  CALCIUM 8.1* 8.3*  GFRNONAA 8* 6*  ANIONGAP 16* 11    Lipids  Recent Labs  Lab 04/13/23 1858  CHOL 160  TRIG 271*  HDL 28*  LDLCALC 78  CHOLHDL 5.7    Hematology Recent Labs  Lab 04/13/23 1028 04/14/23 0035  WBC 7.8 7.1  RBC 3.55* 3.25*  HGB 10.7* 9.9*  HCT 37.1 34.4*  MCV 104.5* 105.8*  MCH 30.1 30.5  MCHC 28.8* 28.8*  RDW 16.6* 16.4*  PLT 174 148*    Radiology    DG Chest Portable 1 View  Result Date: 04/13/2023 CLINICAL DATA:  Chest pain shortness of breath. EXAM: PORTABLE CHEST 1 VIEW COMPARISON:  08/08/2022 FINDINGS: The cardio pericardial silhouette is enlarged. There is pulmonary vascular congestion without overt pulmonary edema. Interstitial pulmonary edema towards the bases cannot be excluded. No focal consolidation or substantial pleural effusion evident. Retrocardiac opacity is likely atelectatic. Bones are diffusely demineralized. Telemetry leads overlie the chest. IMPRESSION: 1. Enlargement of the cardiopericardial silhouette with pulmonary vascular congestion. Dependent interstitial edema cannot be excluded. 2. Retrocardiac opacity is  likely atelectatic. Electronically Signed   By: Kennith Center M.D.   On: 04/13/2023 12:31      Patient Profile     72 year old female with past medical history of diabetes mellitus, hypertension, hyperlipidemia, end-stage renal disease dialysis dependent, COPD, peripheral vascular disease, prior TIA, non-small cell lung cancer status post radiation for evaluation of chest pain. Patient did have a cardiac catheterization in 2015 showing no significant coronary disease. Last echocardiogram February 2023 showed ejection fraction 40 to 45%, mild left ventricular enlargement, grade 1 diastolic dysfunction, severe left atrial enlargement, mild right atrial enlargement, interpreted as mild aortic stenosis with mean gradient 8 mmHg and aortic valve area 1.3 cm.   Assessment & Plan    1 chest pain-presenting  symptoms were atypical and have been persistent for 3 days.  Troponin minimally elevated but in the setting of end-stage renal disease and no clear trend.  Not consistent with acute coronary syndrome.  Most recent echocardiogram showed ejection fraction 40 to 45%.  She is scheduled for repeat study today.  If LV function unchanged would plan to proceed with outpatient Lexiscan nuclear study for risk stratification.  Discontinue nitroglycerin paste as patient is complaining of a headache.   2 history of mild LV dysfunction-await repeat echocardiogram.  There was also question of aortic stenosis.  However she does not have a murmur on examination.  Will reassess.   3 end-stage renal disease-follow-up nephrology as an outpatient.  She is dialyzed on Tuesdays Thursdays and Saturdays.   4 hypertension-blood pressure borderline.  Decrease carvedilol to 6.25 mg twice daily and follow-up.   5 history of lung cancer-no recurrence based on most recent office visit.  For questions or updates, please contact [facility] Please consult [email or web address] for contact info under        Signed, Olga Millers, MD  04/14/2023, 8:18 AM
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
## 2023-04-15 ENCOUNTER — Observation Stay (HOSPITAL_COMMUNITY): Payer: 59

## 2023-04-15 DIAGNOSIS — R079 Chest pain, unspecified: Secondary | ICD-10-CM | POA: Diagnosis not present

## 2023-04-15 DIAGNOSIS — I5031 Acute diastolic (congestive) heart failure: Secondary | ICD-10-CM | POA: Diagnosis not present

## 2023-04-15 DIAGNOSIS — R072 Precordial pain: Secondary | ICD-10-CM | POA: Diagnosis not present

## 2023-04-15 LAB — CBC
HCT: 35.5 % — ABNORMAL LOW (ref 36.0–46.0)
Hemoglobin: 10 g/dL — ABNORMAL LOW (ref 12.0–15.0)
MCH: 30.1 pg (ref 26.0–34.0)
MCHC: 28.2 g/dL — ABNORMAL LOW (ref 30.0–36.0)
MCV: 106.9 fL — ABNORMAL HIGH (ref 80.0–100.0)
Platelets: 163 10*3/uL (ref 150–400)
RBC: 3.32 MIL/uL — ABNORMAL LOW (ref 3.87–5.11)
RDW: 15.8 % — ABNORMAL HIGH (ref 11.5–15.5)
WBC: 7.4 10*3/uL (ref 4.0–10.5)
nRBC: 0.4 % — ABNORMAL HIGH (ref 0.0–0.2)

## 2023-04-15 LAB — BASIC METABOLIC PANEL
Anion gap: 12 (ref 5–15)
BUN: 52 mg/dL — ABNORMAL HIGH (ref 8–23)
CO2: 29 mmol/L (ref 22–32)
Calcium: 8.1 mg/dL — ABNORMAL LOW (ref 8.9–10.3)
Chloride: 93 mmol/L — ABNORMAL LOW (ref 98–111)
Creatinine, Ser: 8.07 mg/dL — ABNORMAL HIGH (ref 0.44–1.00)
GFR, Estimated: 5 mL/min — ABNORMAL LOW (ref >60)
Glucose, Bld: 179 mg/dL — ABNORMAL HIGH (ref 70–99)
Potassium: 4.5 mmol/L (ref 3.5–5.1)
Sodium: 134 mmol/L — ABNORMAL LOW (ref 135–145)

## 2023-04-15 LAB — ECHOCARDIOGRAM COMPLETE
AR max vel: 2.48 cm2
AV Area VTI: 2.52 cm2
AV Area mean vel: 2.45 cm2
AV Mean grad: 7 mmHg
AV Peak grad: 13.4 mmHg
Ao pk vel: 1.83 m/s
Area-P 1/2: 4.49 cm2
Height: 63 in
S' Lateral: 5 cm
Weight: 3626.13 oz

## 2023-04-15 LAB — GLUCOSE, CAPILLARY
Glucose-Capillary: 169 mg/dL — ABNORMAL HIGH (ref 70–99)
Glucose-Capillary: 213 mg/dL — ABNORMAL HIGH (ref 70–99)
Glucose-Capillary: 172 mg/dL — ABNORMAL HIGH (ref 70–99)
Glucose-Capillary: 216 mg/dL — ABNORMAL HIGH (ref 70–99)

## 2023-04-15 LAB — HEPATITIS B SURFACE ANTIGEN: Hepatitis B Surface Ag: NONREACTIVE

## 2023-04-15 LAB — MAGNESIUM: Magnesium: 1.9 mg/dL (ref 1.7–2.4)

## 2023-04-15 MED ORDER — PERFLUTREN LIPID MICROSPHERE
1.0000 mL | INTRAVENOUS | Status: AC | PRN
  Administered 2023-04-15: 2 mL via INTRAVENOUS

## 2023-04-15 MED ORDER — HYDROMORPHONE HCL 2 MG PO TABS
1.0000 mg | ORAL_TABLET | Freq: Four times a day (QID) | ORAL | Status: DC | PRN
  Administered 2023-04-15 – 2023-04-17 (×5): 1 mg via ORAL
  Filled 2023-04-15 (×6): qty 1

## 2023-04-15 MED ORDER — RENA-VITE PO TABS
1.0000 | ORAL_TABLET | Freq: Every day | ORAL | Status: DC
  Administered 2023-04-15 – 2023-04-16 (×2): 1 via ORAL
  Filled 2023-04-15 (×2): qty 1

## 2023-04-15 MED ORDER — CHLORHEXIDINE GLUCONATE CLOTH 2 % EX PADS
6.0000 | MEDICATED_PAD | Freq: Every day | CUTANEOUS | Status: DC
  Administered 2023-04-16: 6 via TOPICAL

## 2023-04-15 MED ORDER — NEPRO/CARBSTEADY PO LIQD
237.0000 mL | Freq: Two times a day (BID) | ORAL | Status: DC
  Administered 2023-04-16 – 2023-04-17 (×2): 237 mL via ORAL

## 2023-04-15 NOTE — Progress Notes (Signed)
Mobility Specialist Progress Note:   04/15/23 1615  Therapy Vitals  Pulse Rate 98  BP 131/66  Mobility  Activity Transferred from chair to bed  Level of Assistance +2 (takes two people) (+3)  Assistive Device Front wheel walker  Activity Response Tolerated fair  Mobility Referral Yes  $Mobility charge 1 Mobility  Mobility Specialist Start Time (ACUTE ONLY) 1610  Mobility Specialist Stop Time (ACUTE ONLY) 1625  Mobility Specialist Time Calculation (min) (ACUTE ONLY) 15 min    Post Mobility:  100 HR,  90% SpO2  Pt received in chair, agreeable to transfer to bed. ModA+3 for STS and tranfer. C/o of bilateral knee pain, otherwise asymptomatic. Pt left in bed with call bell and RN present.  D'Vante Earlene Plater Mobility Specialist Please contact via Special educational needs teacher or Rehab office at [phone]

## 2023-04-15 NOTE — Progress Notes (Signed)
Physical Therapy Treatment Patient Details Name: [name] [name] MRN: [id number] DOB: August 16, 1951 Today's Date: 04/15/2023   History of Present Illness Pt is a 72 y.o. female who presented 04/13/23 with CP while at HD. Troponin minimally elevated but in the setting of end-stage renal disease and no clear trend. Not consistent with acute coronary syndrome. 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 Pt requiring mod-maxAx2 for standing from bed and low BSC. Unable to take pivoting steps and furniture needing to be replaced behind patient while she is standing. Patient will benefit from continued inpatient follow up therapy, <3 hours/day PT will continue to follow acutely.      Assistance Recommended at Discharge Intermittent Supervision/Assistance  If plan is discharge home, recommend the following:  Can travel by private vehicle    A lot of help with walking and/or transfers;A lot of help with bathing/dressing/bathroom;Assistance with cooking/housework;Direct supervision/assist for medications management;Direct supervision/assist for financial management;Assist for transportation   Yes  Equipment Recommendations  Other (comment) (hoyer lift)    Recommendations for Other Services       Precautions / Restrictions Precautions Precautions: Fall Precaution Comments: watch SpO2 (2L baseline) Restrictions Weight Bearing Restrictions: No     Mobility  Bed Mobility Overal bed mobility: Needs Assistance Bed Mobility: Supine to Sit     Supine to sit: Mod assist, +2 for physical assistance     General bed mobility comments: HOB up, use  of rail, increased time, assist to raise trunk and for R hip to EOB with bed pad    Transfers Overall transfer level: Needs assistance Equipment used: Rolling walker (2 wheels) Transfers: Sit to/from Stand Sit to Stand: +2 physical assistance, Mod assist, Max assist, From elevated surface           General transfer comment: +2 mod from elevated bed with feet blocked, +2 max from lower surface of BSC, use of momentum, assist to rise and translate weight over feet          Balance Overall balance assessment: Needs assistance   Sitting balance-Leahy Scale: Good     Standing balance support: Bilateral upper extremity supported, During functional activity, Reliant on assistive device for balance Standing balance-Leahy Scale: Poor                              Cognition Arousal/Alertness: Awake/alert Behavior During Therapy: WFL for tasks assessed/performed Overall Cognitive Status: Within Functional Limits for tasks assessed                                 General Comments: Pt aware she is unsafe to return home, agreeable to SNF.           General Comments General comments (skin integrity, edema, etc.): VSS on 4L O2      Pertinent Vitals/Pain Pain Assessment Pain Assessment: PAINAD Faces Pain Scale: Hurts even more Pain Location: R knee Pain Descriptors / Indicators: Discomfort, Grimacing, Guarding Pain Intervention(s): Limited activity within patient's tolerance, Monitored during session, Repositioned  Home Living Family/patient expects to be discharged to:: Private residence Living Arrangements: Children;Other (Comment) (son) Available Help at Discharge: Family;Available PRN/intermittently;Personal care attendant Type of Home: Apartment Home Access: Level entry       Home Layout: One level Home Equipment: Tub bench;Grab bars - tub/shower;Grab bars - toilet;Rolling Walker (2 wheels);BSC/3in1;Hospital bed;Wheelchair -  Engineer, technical sales - power;Other (comment) (O2)          PT Goals (current goals can now be found in the care plan section) Acute Rehab PT Goals PT Goal Formulation: With patient Time For Goal Achievement: 04/28/23 Potential to Achieve Goals: Fair Progress towards PT goals: Not progressing toward goals - comment    Frequency    Min 1X/week      PT Plan Current plan remains appropriate       AM-PAC PT "6 Clicks" Mobility   Outcome Measure  Help needed turning from your back to your side while in a flat bed without using bedrails?: A Little Help needed moving from lying on your back to sitting on the side of a flat bed without using bedrails?: Total Help needed moving to and from a bed to a chair (including a wheelchair)?: Total Help needed standing up from a chair using your arms (e.g., wheelchair or bedside chair)?: Total Help needed to walk in hospital room?: Total Help needed climbing 3-5 steps with a railing? : Total 6 Click Score: 8    End of Session Equipment Utilized During Treatment: Gait belt;Oxygen Activity Tolerance: Patient limited by pain Patient left: in chair;with call bell/phone within reach;with chair alarm set Nurse Communication: Mobility status;Other (comment) (need to move furniture behind pt when she stands) PT Visit Diagnosis: Unsteadiness on feet (R26.81);Other abnormalities of gait and mobility (R26.89);Muscle weakness (generalized) (M62.81);Difficulty in walking, not elsewhere classified (R26.2);Pain Pain - Right/Left: Right Pain - part of body: Knee     Time: 5284-1324 PT Time Calculation (min) (ACUTE ONLY): 51 min  Charges:    $Therapeutic Activity: 8-22 mins PT General Charges $$ ACUTE PT VISIT: 1 Visit                     Lauris Keepers B. Beverely Risen PT, DPT Acute Rehabilitation Services Please use secure chat or  Call Office [phone]    [facility] 04/15/2023, 4:31 PM

## 2023-04-15 NOTE — Progress Notes (Signed)
Initial Nutrition Assessment  DOCUMENTATION CODES:   Not applicable  INTERVENTION:   - Nepro Shake po BID, each supplement provides 425 kcal and 19 grams protein  - Renal MVI daily  NUTRITION DIAGNOSIS:   Increased nutrient needs related to chronic illness as evidenced by estimated needs.  GOAL:   Patient will meet greater than or equal to 90% of their needs  MONITOR:   PO intake, Supplement acceptance, Labs, Weight trends, I & O's  REASON FOR ASSESSMENT:   Consult Assessment of nutrition requirement/status  ASSESSMENT:   72 year old female who presented to the ED on 7/06 with chest pain, SOB, and abdominal pain. PMH of HTN, T2DM, HLD, ESRD on HD TTS, OSA, tobacco use, COPD, PAD, CHF, TIA, GERD, non-small cell lung cancer s/p curative radiation in remission.  Pt unavailable at time of RD visit x 2. Pt working with therapies on both attempts. Cardiology has been consulted.  Pt currently on a renal diet with 1200 ml fluid restriction. Only one meal completion of 40% available for review. Potassium lab is WNL; no phosphorus available this admission.  Reviewed weight history in chart. Weight up 10 kg over the last 6 months. Suspect weight may be falsely elevated due to edema. Nursing documentation notes moderate pitting edema to BLE. Unsure of pt's EDW.  Will order Nepro Shakes to aid pt in meeting kcal and protein needs. Will also order daily renal MVI.  Meal Completion: 40% x 1 documented meal  Medications reviewed and include: SSI, protonix, renvela 1600 mg TID with meals  Labs reviewed: sodium 134, chloride 93, HDL 28, TG 271, hemoglobin 10.0, hemoglobin A1C 7.1 CBG's: 169-200 x 24 hours  NUTRITION - FOCUSED PHYSICAL EXAM:  Unable to complete at this time.  Diet Order:   Diet Order             Diet renal with fluid restriction Fluid restriction: 1200 mL Fluid; Room service appropriate? Yes; Fluid consistency: Thin  Diet effective now                    EDUCATION NEEDS:   No education needs have been identified at this time  Skin:  Skin Assessment: Reviewed RN Assessment  Last BM:  04/12/23  Height:   Ht Readings from Last 1 Encounters:  04/13/23 5\' 3"  (1.6 m)    Weight:   Wt Readings from Last 1 Encounters:  04/13/23 102.8 kg    Ideal Body Weight:  52.3 kg  BMI:  Body mass index is 40.15 kg/m.  Estimated Nutritional Needs:   Kcal:  1700-1900  Protein:  90-105 grams  Fluid:  1000 ml + UOP    Mertie Clause, MS, RD, LDN Inpatient Clinical Dietitian Please see AMiON for contact information.

## 2023-04-15 NOTE — Progress Notes (Signed)
PROGRESS NOTE    [name] [name]  ZOX:[id number] DOB: 11-16-1950 DOA: 04/13/2023 PCP: Vladimir Crofts, FNP   Brief Narrative:    [name] [name] is a 72 y.o. 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 Patient reports that 2 1/2 hours in the HD session, she started having substernal chest pain, ( she had some chest pressure earlier this morning), at which point the HD was stopped and she was brought to ED for further evaluation. Cardiology following and 2D echo pending. Now awaiting SNF placement too.  Assessment & Plan:   Principal Problem:   Chest pain Active Problems:   PAD (peripheral artery disease) (HCC)   Class 2 obesity due to excess calories with body mass index (BMI) of 36.0 to 36.9 in adult   Hyperlipidemia   COPD with asthma   Hypothyroidism   OSA (obstructive sleep apnea)   DM (diabetes mellitus), type 2 with renal complications (HCC)   Anemia, unspecified   ESRD (end stage renal disease) on dialysis (HCC)   Lung cancer, lower lobe (HCC)  Assessment and Plan:   Chest pain/ pressure at rest , not related to activity Improved.  Troponin elevated at 107, 102.  Echocardiogram ordered for further evaluation-still pending Cardiology consulted.  Continue with aspirin, plavix and statin and coreg.      H/o chronic systolic and diastolic CHF;  Fluid management as per HD.  Last echo from 2021 shows LVef of 40 TO 45%.  Repeat echocardiogram today.      ESRD on HD  TTS schedule;no current need for HD AV fistula in the left upper extremity.  Reports compliance to HD.  Was able to do only 2:30/4 hrs today.  Plan for routine HD on Tues  7/9, will notify Nephro     Asthma/ COPD/ chronic respiratory failure with hypoxic on 2 lit of [place] oxygen at home.  Squamous cell ca of the left lower lobe of the lung S/p radiation treatment  Follows up with Dr Arbutus Ped.    H/o PVD  Continue with aspirin, plavix and statin.      Hypertension:  bP parameters are optimal.      OSA;  Obesity:  Outpatient follow upwith PCP .      Anemia of CKD;  Hemoglobin stable around 10.7.    Insulin dependent DM: Start her on SSI.  Get A1c.      Hypothyroidism:  Resume synthroid.    Morbid obesity BMI 40.15   DVT prophylaxis:Heparin Code Status: Full Family Communication: None at bedside Disposition Plan:  Status is: Observation The patient will require care spanning > 2 midnights and should be moved to inpatient because: Need for SNF.    Consultants:  Cardiology Nephrology  Procedures:  None  Antimicrobials:  None   Subjective: Patient seen and evaluated today with no new acute complaints or concerns. No acute concerns or events noted overnight. She states she has mild chest tightness and no dyspnea.  Objective: Vitals:   04/15/23 0753 04/15/23 1110 04/15/23 1604 04/15/23 1615  BP:  (!) 118/50 131/66 131/66  Pulse:  89 95 98  Resp:  17 18   Temp:  98.3 F (  36.8 C) 98.5 F (36.9 C)   TempSrc:  Oral Oral   SpO2: 94% 92% 95%   Weight:      Height:        Intake/Output Summary (Last 24 hours) at 04/15/2023 1708 Last data filed at 04/14/2023 2200 Gross per 24 hour  Intake 240 ml  Output --  Net 240 ml   Filed Weights   04/13/23 1805  Weight: 102.8 kg    Examination:  General exam: Appears calm and comfortable, morbidly obese Respiratory system: Clear to auscultation. Respiratory effort normal. 3L [place] Cardiovascular system: S1 & S2 heard, RRR.  Gastrointestinal system: Abdomen is soft Central nervous system: Alert and awake Extremities: No edema Skin: No significant lesions noted Psychiatry: Flat  affect.    Data Reviewed: I have personally reviewed following labs and imaging studies  CBC: Recent Labs  Lab 04/13/23 1028 04/14/23 0035 04/15/23 0027  WBC 7.8 7.1 7.4  NEUTROABS  --  5.8  --   HGB 10.7* 9.9* 10.0*  HCT 37.1 34.4* 35.5*  MCV 104.5* 105.8* 106.9*  PLT 174 148* 163   Basic Metabolic Panel: Recent Labs  Lab 04/13/23 1028 04/14/23 0035 04/15/23 0027  NA 135 134* 134*  K 3.8 4.2 4.5  CL 92* 97* 93*  CO2 27 26 29   GLUCOSE 166* 241* 179*  BUN 35* 39* 52*  CREATININE 5.37* 6.38* 8.07*  CALCIUM 8.1* 8.3* 8.1*  MG  --   --  1.9   GFR: Estimated Creatinine Clearance: 7.2 mL/min (A) (by C-G formula based on SCr of 8.07 mg/dL (H)). Liver Function Tests: No results for input(s): "AST", "ALT", "ALKPHOS", "BILITOT", "PROT", "ALBUMIN" in the last 168 hours. No results for input(s): "LIPASE", "AMYLASE" in the last 168 hours. No results for input(s): "AMMONIA" in the last 168 hours. Coagulation Profile: No results for input(s): "INR", "PROTIME" in the last 168 hours. Cardiac Enzymes: No results for input(s): "CKTOTAL", "CKMB", "CKMBINDEX", "TROPONINI" in the last 168 hours. BNP (last 3 results) No results for input(s): "PROBNP" in the last 8760 hours. HbA1C: Recent Labs    04/13/23 1858  HGBA1C 7.1*   CBG: Recent Labs  Lab 04/14/23 1539 04/14/23 2106 04/15/23 0610 04/15/23 1112 04/15/23 1600  GLUCAP 200* 195* 169* 213* 172*   Lipid Profile: Recent Labs    04/13/23 1858  CHOL 160  HDL 28*  LDLCALC 78  TRIG 161*  CHOLHDL 5.7   Thyroid Function Tests: No results for input(s): "TSH", "T4TOTAL", "FREET4", "T3FREE", "THYROIDAB" in the last 72 hours. Anemia Panel: No results for input(s): "VITAMINB12", "FOLATE", "FERRITIN", "TIBC", "IRON", "RETICCTPCT" in the last 72 hours. Sepsis Labs: No results for input(s): "PROCALCITON", "LATICACIDVEN" in the last 168 hours.  No results found for this or any previous visit (from the past 240 hour(s)).        Radiology Studies: No results found.      Scheduled Meds:  allopurinol  100 mg Oral Daily   aspirin  324 mg Oral Once   carvedilol  6.25 mg Oral BID WC   clopidogrel  75 mg Oral Daily   feeding supplement (NEPRO CARB STEADY)  237 mL Oral BID BM   fluticasone  1 spray Each Nare Daily   heparin  5,000 Units Subcutaneous Q8H   insulin aspart  0-6 Units Subcutaneous TID WC   levothyroxine  200 mcg Oral Q0600   mometasone-formoterol  2 puff Inhalation BID   multivitamin  1 tablet Oral QHS   pantoprazole  40 mg Oral  BID AC   sevelamer carbonate  1,600 mg Oral TID WC     LOS: 0 days    Time spent: 35 minutes    Paz Winsett Hoover Brunette, DO [facility]  If 7PM-7AM, please contact night-coverage [email or web address] 04/15/2023, 5:08 PM

## 2023-04-15 NOTE — TOC Initial Note (Signed)
Transition of Care [facility]) - Initial/Assessment Note    Patient Details  Name: [name] [name] MRN: [id number] Date of Birth: [birth year]  Transition [facility] CM/SW Contact:    Harriet Masson, RN Phone Number: 04/15/2023, 3:13 PM  Clinical Narrative:                 Spoke to patient regarding transition needs.  Patient lives with son and has aide for 3 hours a day. Patient is agreeable to SNF. CSW aware of SNF recommendations.   Expected Discharge Plan: Skilled Nursing Facility Barriers to Discharge: Continued Medical Work up   Patient Goals and CMS Choice Patient states their goals for this hospitalization and ongoing recovery are:: agreeable to SNF          Expected Discharge Plan and Services       Living arrangements for the past 2 months: Apartment                                      Prior Living Arrangements/Services Living arrangements for the past 2 months: Apartment Lives with:: Adult Children Patient language and need for interpreter reviewed:: Yes        Need for Family Participation in Patient Care: Yes (Comment) Care giver support system in place?: Yes (comment)   Criminal Activity/Legal Involvement Pertinent to Current Situation/Hospitalization: No - Comment as needed  Activities of Daily Living Home Assistive Devices/Equipment: Bedside commode/3-in-1, Dentures (specify type), Hospital bed, Wheelchair, Blood pressure cuff, Shower chair with back, Grab bars around toilet, Hand-held shower hose, Walker (specify type), CBG Meter, Grab bars in shower, Oxygen, Nebulizer, Scales ADL Screening (condition at time of admission) Patient's cognitive ability adequate to safely complete daily activities?: Yes Is the patient deaf or have difficulty hearing?: No Does the patient have difficulty seeing, even when wearing glasses/contacts?: Yes Does the patient have difficulty concentrating, remembering, or making decisions?: No Patient able to express  need for assistance with ADLs?: Yes Does the patient have difficulty dressing or bathing?: Yes Independently performs ADLs?: No Communication: Needs assistance Is this a change from baseline?: Pre-admission baseline Does the patient have difficulty walking or climbing stairs?: Yes Weakness of Legs: Both Weakness of Arms/Hands: Both  Permission Sought/Granted                  Emotional Assessment Appearance:: Appears stated age Attitude/Demeanor/Rapport: Engaged Affect (typically observed): Accepting Orientation: : Oriented to Self, Oriented to Place, Oriented to  Time, Oriented to Situation Alcohol / Substance Use: Not Applicable Psych Involvement: No (comment)  Admission diagnosis:  Chest pain [R07.9] Chest pain, unspecified type [R07.9] Patient Active Problem List   Diagnosis Date Noted   Acute respiratory failure (HCC) 08/07/2022   Respiratory failure, post-operative (HCC) 08/07/2022   Pulmonary edema 08/07/2022   Acute on chronic respiratory failure with hypoxia and hypercapnia (HCC) 08/07/2022   Lung mass 07/30/2022   Lung cancer, lower lobe (HCC) 01/31/2022   Pressure injury of skin 12/06/2021   Malnutrition of moderate degree 12/01/2021   ESRD (end stage renal disease) on dialysis [facility])    Lung nodules    Advanced care planning/counseling discussion    Goals of care, counseling/discussion    Acute respiratory failure with hypoxia and hypercarbia (HCC)    Labored breathing    Kidney lesion, native, left 11/24/2021   CKD (chronic kidney disease) stage 5, GFR less than 15 ml/min (HCC) 11/23/2021  Syncope 11/21/2021   Chronic diastolic (congestive) heart failure (HCC) 07/01/2021   Chronic pain syndrome 07/01/2021   COPD exacerbation (HCC) 07/01/2021   Cellulitis of leg, right 07/01/2021   SARS-CoV-2 positive 02/21/2021   Increased weakness when ambulating 02/17/2021   Intractable nausea and vomiting 02/15/2021   Symptomatic anemia 01/24/2021   Anemia,  unspecified 01/08/2020   Bacteremia 01/07/2020   UTI (urinary tract infection) 01/06/2020   Anemia 10/26/2019   S/P lumbar fusion 09/18/2019   Chronic CHF (congestive heart failure) (HCC) 03/16/2019   Leukocytosis 03/16/2019   Chest pain 11/27/2018   Pre-syncope 11/27/2018   Epigastric abdominal pain 11/27/2018   Solitary pulmonary nodule 11/06/2018   Hypertensive heart and renal disease, stage 1-4 or unspecified chronic kidney disease, with heart failure (HCC) 10/20/2018   CHF (congestive heart failure), NYHA class III, acute on chronic, systolic (HCC) 10/20/2018   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, unspecified extremity (HCC) 04/19/2016   Cellulitis of abdominal wall 03/21/2015   Acute on chronic renal failure (HCC) 03/21/2015   DM (diabetes mellitus), type 2 with renal complications (HCC) 03/21/2015   Abdominal wall abscess 03/21/2015   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   Low urine output 08/11/2014   Acute on chronic systolic CHF (congestive heart failure), NYHA class 3 (HCC) 08/11/2014   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   Hypothyroidism 08/11/2014   OSA (obstructive sleep apnea) 08/11/2014   Positive cardiac stress test 06/29/2014   Hyperlipidemia 02/12/2014   Left arm weakness 12/23/2013   TIA (transient ischemic attack) 12/23/2013   Left buttock abscess 10/12/2013   Cellulitis and abscess of leg, right 04/16/2013   Acute blood loss anemia 02/14/2013   HTN (hypertension) 02/14/2013   Cellulitis and abscess of buttock 02/09/2013   Class 2 obesity due to excess calories with body mass index (BMI) of 36.0 to 36.9 in adult 02/03/2013   Diarrhea 02/03/2013   Hyponatremia 02/03/2013   Sepsis due to undetermined organism (HCC) 02/02/2013   Diabetes mellitus without complication (HCC) 02/02/2013   CKD (chronic kidney disease), stage IV (HCC) 02/02/2013   Cellulitis 02/02/2013   PAD (peripheral artery disease) (HCC) 04/26/2011   Tobacco  abuse 04/26/2011   PCP:  Vladimir Crofts, FNP Pharmacy:   [facility] [phone] - [place], [place] - [address] AT [facility] [address] & [address] [place] [address] Phone: [phone] Fax: [phone]     Social Determinants of Health (SDOH) Social History: SDOH Screenings   Food Insecurity: No Food Insecurity (04/13/2023)  Housing: Medium Risk (04/13/2023)  Transportation Needs: No Transportation Needs (04/13/2023)  Utilities: Not At Risk (04/13/2023)  Tobacco Use: Medium Risk (04/13/2023)   SDOH Interventions:     Readmission Risk Interventions     No data to display

## 2023-04-15 NOTE — TOC Progression Note (Addendum)
Transition of Care [facility]) - Progression Note    Patient Details  Name: [name] [name] MRN: [id number] Date of Birth: [birth year]  [facility] CM/SW Contact  Eduard Roux, [place] Phone Number: 04/15/2023, 9:55 AM  Clinical Narrative:      CSW informed by CM- patient is agreeable to short term rehab at [facility].   TOC will provide bed offers once available.  TOC will continue to follow and assist with discharge planning.   Antony Blackbird, MSW, LCSW Clinical Social Worker        Expected Discharge Plan and Services                                               Social Determinants of Health (SDOH) Interventions SDOH Screenings   Food Insecurity: No Food Insecurity (04/13/2023)  Housing: Medium Risk (04/13/2023)  Transportation Needs: No Transportation Needs (04/13/2023)  Utilities: Not At Risk (04/13/2023)  Tobacco Use: Medium Risk (04/13/2023)    Readmission Risk Interventions     No data to display

## 2023-04-15 NOTE — Progress Notes (Signed)
Echocardiogram 2D Echocardiogram has been performed.  [name] [name] 04/15/2023, 1:12 PM

## 2023-04-15 NOTE — Progress Notes (Signed)
Pt receives out-pt HD at [facility] on TTS. Pt arrives at 6:00 am for 6:15 chair time. Information provided to CSW for d/c planning purposes. Will assist as needed.   Olivia Canter Renal Navigator [phone]

## 2023-04-15 NOTE — Progress Notes (Signed)
Progress Note  Patient Name: [name] [name] Date of Encounter: 04/15/2023  Primary Cardiologist:   Nanetta Batty, MD   Subjective   Her chest pain has been constant.  No waking or waning.    Inpatient Medications    Scheduled Meds:  allopurinol  100 mg Oral Daily   aspirin  324 mg Oral Once   carvedilol  6.25 mg Oral BID WC   clopidogrel  75 mg Oral Daily   fluticasone  1 spray Each Nare Daily   heparin  5,000 Units Subcutaneous Q8H   insulin aspart  0-6 Units Subcutaneous TID WC   levothyroxine  200 mcg Oral Q0600   mometasone-formoterol  2 puff Inhalation BID   pantoprazole  40 mg Oral BID AC   sevelamer carbonate  1,600 mg Oral TID WC   Continuous Infusions:  PRN Meds: acetaminophen, albuterol   Vital Signs    Vitals:   04/14/23 2242 04/15/23 0607 04/15/23 0708 04/15/23 0734  BP: (!) 158/71 (!) 142/58 (!) 147/60   Pulse: (!) 102 99 (!) 102 100  Resp: 15 20 20    Temp: 98.2 F (36.8 C) 97.6 F (36.4 C) 98.1 F (36.7 C)   TempSrc: Oral Oral Oral   SpO2: 96% 97% 96%   Weight:      Height:        Intake/Output Summary (Last 24 hours) at 04/15/2023 0755 Last data filed at 04/14/2023 2200 Gross per 24 hour  Intake 240 ml  Output --  Net 240 ml   Filed Weights   04/13/23 1805  Weight: 102.8 kg    Telemetry    NSR, PVCs - Personally Reviewed  ECG    NA - Personally Reviewed  Physical Exam   GEN: No acute distress.   Neck: No  JVD Cardiac: RRR, no murmurs, rubs, or gallops.  Respiratory:   Decreased breath sounds with scattered expiratory wheezing GI: Soft, nontender, non-distended  MS: No  edema; No deformity. Neuro:  Nonfocal  Psych: Normal affect   Labs    Chemistry Recent Labs  Lab 04/13/23 1028 04/14/23 0035 04/15/23 0027  NA 135 134* 134*  K 3.8 4.2 4.5  CL 92* 97* 93*  CO2 27 26 29   GLUCOSE 166* 241* 179*  BUN 35* 39* 52*  CREATININE 5.37* 6.38* 8.07*  CALCIUM 8.1* 8.3* 8.1*  GFRNONAA 8* 6* 5*  ANIONGAP 16* 11 12      Hematology Recent Labs  Lab 04/13/23 1028 04/14/23 0035 04/15/23 0027  WBC 7.8 7.1 7.4  RBC 3.55* 3.25* 3.32*  HGB 10.7* 9.9* 10.0*  HCT 37.1 34.4* 35.5*  MCV 104.5* 105.8* 106.9*  MCH 30.1 30.5 30.1  MCHC 28.8* 28.8* 28.2*  RDW 16.6* 16.4* 15.8*  PLT 174 148* 163    Cardiac EnzymesNo results for input(s): "TROPONINI" in the last 168 hours. No results for input(s): "TROPIPOC" in the last 168 hours.   BNPNo results for input(s): "BNP", "PROBNP" in the last 168 hours.   DDimer No results for input(s): "DDIMER" in the last 168 hours.   Radiology    DG Chest Portable 1 View  Result Date: 04/13/2023 CLINICAL DATA:  Chest pain shortness of breath. EXAM: PORTABLE CHEST 1 VIEW COMPARISON:  08/08/2022 FINDINGS: The cardio pericardial silhouette is enlarged. There is pulmonary vascular congestion without overt pulmonary edema. Interstitial pulmonary edema towards the bases cannot be excluded. No focal consolidation or substantial pleural effusion evident. Retrocardiac opacity is likely atelectatic. Bones are diffusely demineralized. Telemetry leads  overlie the chest. IMPRESSION: 1. Enlargement of the cardiopericardial silhouette with pulmonary vascular congestion. Dependent interstitial edema cannot be excluded. 2. Retrocardiac opacity is likely atelectatic. Electronically Signed   By: Kennith Center M.D.   On: 04/13/2023 12:31    Cardiac Studies   Echo pending    Patient Profile     72 y.o. female with past medical history of diabetes mellitus, hypertension, hyperlipidemia, end-stage renal disease dialysis dependent, COPD, peripheral vascular disease, prior TIA, non-small cell lung cancer status post radiation for evaluation of chest pain. Patient did have a cardiac catheterization in 2015 showing no significant coronary disease. Last echocardiogram February 2023 showed ejection fraction 40 to 45%, mild left ventricular enlargement, grade 1 diastolic dysfunction, severe left atrial  enlargement, mild right atrial enlargement, interpreted as mild aortic stenosis with mean gradient 8 mmHg and aortic valve area 1.3 cm.   Assessment & Plan    Chest pain:  Elevated trop. Trop trend has been flat.   Awaiting echo.   If echo unchanged from previous we will plan out patient PET scanning.    ESRD:  Per nephrololgy.   HTN:    BP is not well controlled.  She has not tolerated increased dose beta blocker.   Allergy to ACE.  Could consider low dose Norvasc if BP remains elevated  Chronic systolic and diastolic HF:  Volume per dialysis.    For questions or updates, please contact [facility] Please consult [email or web address] for contact info under Cardiology/STEMI.   Signed, Rollene Rotunda, MD  04/15/2023, 7:55 AM

## 2023-04-15 NOTE — NC FL2 (Addendum)
[place] MEDICAID FL2 LEVEL OF CARE FORM     IDENTIFICATION  Patient Name: [name] [name] Birthdate: 1951-10-08 Sex: female Admission Date (Current Location): 04/13/2023  [facility] [place] Number:  Producer, television/film/video and Address:  [facility], [address] [place], [place] [address]      Provider Number: [id number]  Attending Physician Name and Address:  Erick Blinks, DO  Relative Name and Phone Number:       Current Level of Care: Hospital Recommended Level of Care: Skilled Nursing Facility Prior Approval Number:    Date Approved/Denied:   PASRR Number: [id number]  Discharge Plan: SNF    Current Diagnoses: Patient Active Problem List   Diagnosis Date Noted   Acute respiratory failure (HCC) 08/07/2022   Respiratory failure, post-operative (HCC) 08/07/2022   Pulmonary edema 08/07/2022   Acute on chronic respiratory failure with hypoxia and hypercapnia (HCC) 08/07/2022   Lung mass 07/30/2022   Lung cancer, lower lobe (HCC) 01/31/2022   Pressure injury of skin 12/06/2021   Malnutrition of moderate degree 12/01/2021   ESRD (end stage renal disease) on dialysis [facility])    Lung nodules    Advanced care planning/counseling discussion    Goals of care, counseling/discussion    Acute respiratory failure with hypoxia and hypercarbia (HCC)    Labored breathing    Kidney lesion, native, left 11/24/2021   CKD (chronic kidney disease) stage 5, GFR less than 15 ml/min (HCC) 11/23/2021   Syncope 11/21/2021   Chronic diastolic (congestive) heart failure (HCC) 07/01/2021   Chronic pain syndrome 07/01/2021   COPD exacerbation (HCC) 07/01/2021   Cellulitis of leg, right 07/01/2021   SARS-CoV-2 positive 02/21/2021   Increased weakness when ambulating 02/17/2021   Intractable nausea and vomiting 02/15/2021   Symptomatic anemia 01/24/2021   Anemia, unspecified 01/08/2020   Bacteremia 01/07/2020   UTI (urinary tract infection) 01/06/2020   Anemia  10/26/2019   S/P lumbar fusion 09/18/2019   Chronic CHF (congestive heart failure) (HCC) 03/16/2019   Leukocytosis 03/16/2019   Chest pain 11/27/2018   Pre-syncope 11/27/2018   Epigastric abdominal pain 11/27/2018   Solitary pulmonary nodule 11/06/2018   Hypertensive heart and renal disease, stage 1-4 or unspecified chronic kidney disease, with heart failure (HCC) 10/20/2018   CHF (congestive heart failure), NYHA class III, acute on chronic, systolic (HCC) 10/20/2018   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, unspecified extremity (HCC) 04/19/2016   Cellulitis of abdominal wall 03/21/2015   Acute on chronic renal failure (HCC) 03/21/2015   DM (diabetes mellitus), type 2 with renal complications (HCC) 03/21/2015   Abdominal wall abscess 03/21/2015   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   Low urine output 08/11/2014   Acute on chronic systolic CHF (congestive heart failure), NYHA class 3 (HCC) 08/11/2014   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   Hypothyroidism 08/11/2014   OSA (obstructive sleep apnea) 08/11/2014   Positive cardiac stress test 06/29/2014   Hyperlipidemia 02/12/2014   Left arm weakness 12/23/2013   TIA (transient ischemic attack) 12/23/2013   Left buttock abscess 10/12/2013   Cellulitis and abscess of leg, right 04/16/2013   Acute blood loss anemia 02/14/2013   HTN (hypertension) 02/14/2013   Cellulitis and abscess of buttock 02/09/2013   Class 2 obesity due to excess calories with body mass index (BMI) of 36.0 to 36.9 in adult 02/03/2013   Diarrhea 02/03/2013   Hyponatremia 02/03/2013   Sepsis due to undetermined organism (HCC) 02/02/2013  Diabetes mellitus without complication (HCC) 02/02/2013   CKD (chronic kidney disease), stage IV (HCC) 02/02/2013   Cellulitis 02/02/2013   PAD (peripheral artery disease) (HCC) 04/26/2011   Tobacco abuse 04/26/2011    Orientation RESPIRATION BLADDER Height & Weight     Self, Time, Situation,  Place  Normal Continent Weight: 226 lb 10.1 oz (102.8 kg) Height:  5\' 3"  (160 cm)  BEHAVIORAL SYMPTOMS/MOOD NEUROLOGICAL BOWEL NUTRITION STATUS      Continent Diet (please see discharge summary)  AMBULATORY STATUS COMMUNICATION OF NEEDS Skin   Limited Assist Verbally Normal                       Personal Care Assistance Level of Assistance  Bathing, Feeding, Dressing Bathing Assistance: Limited assistance Feeding assistance: Independent Dressing Assistance: Limited assistance     Functional Limitations Info  Hearing, Speech, Sight Sight Info: Adequate Hearing Info: Adequate Speech Info: Adequate    SPECIAL CARE FACTORS FREQUENCY  OT (By licensed OT), PT (By licensed PT)     PT Frequency: 5x per week OT Frequency: 5x per week            Contractures Contractures Info: Not present    Additional Factors Info  Code Status, Allergies Code Status Info: FULL Allergies Info: Nebivolol,Zestril,Ace Inhibitors, Morphine and Codeine           Current Medications (04/15/2023):  This is the current hospital active medication list Current Facility-Administered Medications  Medication Dose Route Frequency Provider Last Rate Last Admin   acetaminophen (TYLENOL) tablet 650 mg  650 mg Oral Q4H PRN Kathlen Mody, MD   650 mg at 04/15/23 0917   albuterol (PROVENTIL) (2.5 MG/3ML) 0.083% nebulizer solution 2.5 mg  2.5 mg Inhalation Q6H PRN Kathlen Mody, MD       allopurinol (ZYLOPRIM) tablet 100 mg  100 mg Oral Daily Kathlen Mody, MD   100 mg at 04/15/23 4098   aspirin chewable tablet 324 mg  324 mg Oral Once Kathlen Mody, MD       carvedilol (COREG) tablet 6.25 mg  6.25 mg Oral BID WC Lewayne Bunting, MD   6.25 mg at 04/15/23 0734   clopidogrel (PLAVIX) tablet 75 mg  75 mg Oral Daily Kathlen Mody, MD   75 mg at 04/15/23 0917   fluticasone (FLONASE) 50 MCG/ACT nasal spray 1 spray  1 spray Each Nare Daily Kathlen Mody, MD   1 spray at 04/15/23 1204   heparin injection 5,000  Units  5,000 Units Subcutaneous Q8H Kathlen Mody, MD   5,000 Units at 04/15/23 1191   HYDROmorphone (DILAUDID) tablet 1 mg  1 mg Oral Q6H PRN Sherryll Burger, Pratik D, DO   1 mg at 04/15/23 0953   insulin aspart (novoLOG) injection 0-6 Units  0-6 Units Subcutaneous TID WC Kathlen Mody, MD   2 Units at 04/15/23 1153   levothyroxine (SYNTHROID) tablet 200 mcg  200 mcg Oral Q0600 Kathlen Mody, MD   200 mcg at 04/15/23 4782   mometasone-formoterol (DULERA) 200-5 MCG/ACT inhaler 2 puff  2 puff Inhalation BID Kathlen Mody, MD   2 puff at 04/15/23 0753   pantoprazole (PROTONIX) EC tablet 40 mg  40 mg Oral BID AC Kathlen Mody, MD   40 mg at 04/15/23 9562   perflutren lipid microspheres (DEFINITY) IV suspension  1-10 mL Intravenous PRN Sherryll Burger, Pratik D, DO   2 mL at 04/15/23 1311   sevelamer carbonate (RENVELA) tablet 1,600 mg  1,600 mg Oral TID  WC Kathlen Mody, MD   1,600 mg at 04/15/23 1152     Discharge Medications: Please see discharge summary for a list of discharge medications.  Relevant Imaging Results:  Relevant Lab Results:   Additional Information SSN [id number]  Dialysis Patient  T/TH/Sat  Eduard Roux, LCSW

## 2023-04-15 NOTE — Evaluation (Signed)
Occupational Therapy Evaluation Patient Details Name: [name] [name] MRN: [id number] DOB: 1951-06-20 Today's Date: 04/15/2023   History of Present Illness Pt is a 72 y.o. female who presented 04/13/23 with CP while at HD. Troponin minimally elevated but in the setting of end-stage renal disease and no clear trend. Not consistent with acute coronary syndrome. 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 She transfers with assist and uses a hoveround as her means of mobility. Pt presents with generalized weakness, poor standing balance and decreased activity tolerance. She requires +2 mod to max assist to stand and is unable to take steps to transfers. Moved furniture behind pt as she stood statically for toileting on BSC and to sit in chair. Pt requires 4L O2 to maintain SpO2 above 90%. Pt has longstanding limitations in R shoulder. Patient will benefit from continued inpatient follow up therapy, <3 hours/day      Recommendations for follow up therapy are one component of a multi-disciplinary discharge planning process, led by the attending physician.  Recommendations may be updated based on patient status, additional functional criteria and insurance authorization.   Assistance Recommended at Discharge Frequent or constant Supervision/Assistance  Patient can return home with the following Two people to help with walking and/or transfers;Two people to help with bathing/dressing/bathroom;Assistance with cooking/housework;Assist for transportation;Help with stairs or ramp for entrance    Functional Status  Assessment  Patient has had a recent decline in their functional status and demonstrates the ability to make significant improvements in function in a reasonable and predictable amount of time.  Equipment Recommendations  None recommended by OT    Recommendations for Other Services       Precautions / Restrictions Precautions Precautions: Fall Precaution Comments: watch SpO2 (2L baseline) Restrictions Weight Bearing Restrictions: No      Mobility Bed Mobility Overal bed mobility: Needs Assistance Bed Mobility: Supine to Sit     Supine to sit: Mod assist, +2 for physical assistance     General bed mobility comments: HOB up, use of rail, increased time, assist to raise trunk and for R hip to EOB with bed pad    Transfers Overall transfer level: Needs assistance Equipment used: Rolling walker (2 wheels) Transfers: Sit to/from Stand Sit to Stand: +2 physical assistance, Mod assist, Max assist, From elevated surface           General transfer comment: +2 mod from elevated bed with feet blocked, +2 max from lower surface of BSC, use of momentum, assist to rise and translate weight over feet      Balance Overall balance assessment: Needs assistance   Sitting balance-Leahy Scale: Good     Standing balance support: Bilateral upper extremity supported, During functional activity, Reliant on assistive device for balance Standing balance-Leahy Scale: Poor                             ADL either performed or assessed with clinical judgement   ADL Overall ADL's : Needs assistance/impaired Eating/Feeding: Independent;Sitting;Bed level   Grooming: Sitting;Set up   Upper  Body Bathing: Moderate assistance;Sitting   Lower Body Bathing: +2 for physical assistance;Total assistance;Sit to/from stand   Upper Body Dressing : Minimal assistance;Sitting   Lower Body Dressing: Total assistance;+2 for physical assistance;Sit to/from stand   Toilet Transfer: +2 for  physical assistance;Maximal assistance;Rolling walker (2 wheels);Requires wide/bariatric   Toileting- Clothing Manipulation and Hygiene: +2 for physical assistance;Total assistance;Sit to/from stand               Vision Ability to See in Adequate Light: 0 Adequate Patient Visual Report: No change from baseline       Perception     Praxis      Pertinent Vitals/Pain Pain Assessment Pain Assessment: Faces Faces Pain Scale: Hurts even more Pain Location: R knee Pain Descriptors / Indicators: Discomfort, Grimacing, Guarding Pain Intervention(s): Monitored during session, Repositioned     Hand Dominance Right   Extremity/Trunk Assessment Upper Extremity Assessment Upper Extremity Assessment: Generalized weakness;RUE deficits/detail RUE Deficits / Details: reports 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 RUE: Shoulder pain with ROM RUE Coordination: decreased gross motor   Lower Extremity Assessment Lower Extremity Assessment: Defer to PT evaluation   Cervical / Trunk Assessment Cervical / Trunk Assessment: Other exceptions Cervical / Trunk Exceptions: increased body habitus   Communication Communication Communication: No difficulties   Cognition Arousal/Alertness: Awake/alert Behavior During Therapy: WFL for tasks assessed/performed Overall Cognitive Status: Within Functional Limits for tasks assessed                                 General Comments: Pt aware she is unsafe to return home, agreeable to SNF.     General Comments       Exercises     Shoulder Instructions      Home Living Family/patient expects to be discharged to:: Private residence Living Arrangements: Children;Other (Comment) (son) Available Help at Discharge: Family;Available PRN/intermittently;Personal care attendant Type of Home: Apartment Home Access: Level entry     Home Layout: One level     Bathroom Shower/Tub: Producer, television/film/video:  Handicapped height Bathroom Accessibility: Yes How Accessible: Accessible via walker Home Equipment: Tub bench;Grab bars - tub/shower;Grab bars - toilet;Rolling Walker (2 wheels);BSC/3in1;Hospital bed;Wheelchair - Engineer, technical sales - power;Other (comment) (O2)          Prior Functioning/Environment Prior Level of Function : Needs assist             Mobility Comments: transfers with one person assist, uses hoveround in her home ADLs Comments: assisted by aide for toileting, bathing, dressing and all IADLs        OT Problem List: Decreased strength;Decreased activity tolerance;Impaired balance (sitting and/or standing);Decreased coordination;Pain      OT Treatment/Interventions: DME and/or AE instruction;Therapeutic activities;Balance training;Therapeutic exercise;Self-care/ADL training    OT Goals(Current goals can be found in the care plan section) Acute Rehab OT Goals OT Goal Formulation: With patient Time For Goal Achievement: 04/29/23 Potential to Achieve Goals: Good  OT Frequency: Min 1X/week    Co-evaluation              AM-PAC OT "6 Clicks" Daily Activity     Outcome Measure Help from another person eating meals?: None Help from another person taking care of personal grooming?: A Little Help from another person toileting, which includes using toliet, bedpan, or urinal?: Total Help from another person bathing (including washing, rinsing, drying)?: A Lot Help from another person to put on and taking off  regular upper body clothing?: A Little Help from another person to put on and taking off regular lower body clothing?: Total 6 Click Score: 14   End of Session Equipment Utilized During Treatment: Rolling walker (2 wheels);Oxygen (4L) Nurse Communication: Mobility status  Activity Tolerance: Patient tolerated treatment well Patient left: in chair;with call bell/phone within reach;with chair alarm set  OT Visit Diagnosis: Unsteadiness on feet  (R26.81);Pain;Muscle weakness (generalized) (M62.81)                Time: 7829-5621 OT Time Calculation (min): 51 min Charges:  OT General Charges $OT Visit: 1 Visit OT Evaluation $OT Eval Moderate Complexity: 1 Mod  Berna Spare, OTR/L Acute Rehabilitation Services Office: [phone]   Evern Bio 04/15/2023, 3:53 PM

## 2023-04-16 ENCOUNTER — Other Ambulatory Visit: Payer: Self-pay | Admitting: Cardiology

## 2023-04-16 DIAGNOSIS — J4489 Other specified chronic obstructive pulmonary disease: Secondary | ICD-10-CM | POA: Diagnosis present

## 2023-04-16 DIAGNOSIS — E890 Postprocedural hypothyroidism: Secondary | ICD-10-CM | POA: Diagnosis present

## 2023-04-16 DIAGNOSIS — I5042 Chronic combined systolic (congestive) and diastolic (congestive) heart failure: Secondary | ICD-10-CM | POA: Diagnosis present

## 2023-04-16 DIAGNOSIS — I350 Nonrheumatic aortic (valve) stenosis: Secondary | ICD-10-CM | POA: Diagnosis present

## 2023-04-16 DIAGNOSIS — I132 Hypertensive heart and chronic kidney disease with heart failure and with stage 5 chronic kidney disease, or end stage renal disease: Secondary | ICD-10-CM | POA: Diagnosis present

## 2023-04-16 DIAGNOSIS — Z992 Dependence on renal dialysis: Secondary | ICD-10-CM | POA: Diagnosis not present

## 2023-04-16 DIAGNOSIS — I428 Other cardiomyopathies: Secondary | ICD-10-CM | POA: Diagnosis present

## 2023-04-16 DIAGNOSIS — D631 Anemia in chronic kidney disease: Secondary | ICD-10-CM | POA: Diagnosis present

## 2023-04-16 DIAGNOSIS — E1165 Type 2 diabetes mellitus with hyperglycemia: Secondary | ICD-10-CM | POA: Diagnosis present

## 2023-04-16 DIAGNOSIS — I272 Pulmonary hypertension, unspecified: Secondary | ICD-10-CM | POA: Diagnosis present

## 2023-04-16 DIAGNOSIS — N2581 Secondary hyperparathyroidism of renal origin: Secondary | ICD-10-CM | POA: Diagnosis present

## 2023-04-16 DIAGNOSIS — N186 End stage renal disease: Secondary | ICD-10-CM | POA: Diagnosis present

## 2023-04-16 DIAGNOSIS — R079 Chest pain, unspecified: Secondary | ICD-10-CM

## 2023-04-16 DIAGNOSIS — Z6841 Body Mass Index (BMI) 40.0 and over, adult: Secondary | ICD-10-CM | POA: Diagnosis not present

## 2023-04-16 DIAGNOSIS — R072 Precordial pain: Secondary | ICD-10-CM | POA: Diagnosis not present

## 2023-04-16 DIAGNOSIS — J9611 Chronic respiratory failure with hypoxia: Secondary | ICD-10-CM | POA: Diagnosis present

## 2023-04-16 DIAGNOSIS — G894 Chronic pain syndrome: Secondary | ICD-10-CM | POA: Diagnosis present

## 2023-04-16 DIAGNOSIS — Z794 Long term (current) use of insulin: Secondary | ICD-10-CM | POA: Diagnosis not present

## 2023-04-16 DIAGNOSIS — E785 Hyperlipidemia, unspecified: Secondary | ICD-10-CM | POA: Diagnosis present

## 2023-04-16 DIAGNOSIS — E1151 Type 2 diabetes mellitus with diabetic peripheral angiopathy without gangrene: Secondary | ICD-10-CM | POA: Diagnosis present

## 2023-04-16 DIAGNOSIS — R0789 Other chest pain: Secondary | ICD-10-CM | POA: Diagnosis present

## 2023-04-16 DIAGNOSIS — I472 Ventricular tachycardia, unspecified: Secondary | ICD-10-CM | POA: Diagnosis present

## 2023-04-16 DIAGNOSIS — E1122 Type 2 diabetes mellitus with diabetic chronic kidney disease: Secondary | ICD-10-CM | POA: Diagnosis present

## 2023-04-16 DIAGNOSIS — C3432 Malignant neoplasm of lower lobe, left bronchus or lung: Secondary | ICD-10-CM | POA: Diagnosis present

## 2023-04-16 LAB — CBC
HCT: 34.6 % — ABNORMAL LOW (ref 36.0–46.0)
Hemoglobin: 10.1 g/dL — ABNORMAL LOW (ref 12.0–15.0)
MCH: 31.1 pg (ref 26.0–34.0)
MCHC: 29.2 g/dL — ABNORMAL LOW (ref 30.0–36.0)
MCV: 106.5 fL — ABNORMAL HIGH (ref 80.0–100.0)
Platelets: 149 10*3/uL — ABNORMAL LOW (ref 150–400)
RBC: 3.25 MIL/uL — ABNORMAL LOW (ref 3.87–5.11)
RDW: 15.2 % (ref 11.5–15.5)
WBC: 8.6 10*3/uL (ref 4.0–10.5)
nRBC: 0 % (ref 0.0–0.2)

## 2023-04-16 LAB — RENAL FUNCTION PANEL
Albumin: 3 g/dL — ABNORMAL LOW (ref 3.5–5.0)
Anion gap: 13 (ref 5–15)
BUN: 66 mg/dL — ABNORMAL HIGH (ref 8–23)
CO2: 28 mmol/L (ref 22–32)
Calcium: 8.3 mg/dL — ABNORMAL LOW (ref 8.9–10.3)
Chloride: 92 mmol/L — ABNORMAL LOW (ref 98–111)
Creatinine, Ser: 9.38 mg/dL — ABNORMAL HIGH (ref 0.44–1.00)
GFR, Estimated: 4 mL/min — ABNORMAL LOW (ref >60)
Glucose, Bld: 164 mg/dL — ABNORMAL HIGH (ref 70–99)
Phosphorus: 6.8 mg/dL — ABNORMAL HIGH (ref 2.5–4.6)
Potassium: 5 mmol/L (ref 3.5–5.1)
Sodium: 133 mmol/L — ABNORMAL LOW (ref 135–145)

## 2023-04-16 LAB — GLUCOSE, CAPILLARY
Glucose-Capillary: 136 mg/dL — ABNORMAL HIGH (ref 70–99)
Glucose-Capillary: 135 mg/dL — ABNORMAL HIGH (ref 70–99)
Glucose-Capillary: 175 mg/dL — ABNORMAL HIGH (ref 70–99)
Glucose-Capillary: 242 mg/dL — ABNORMAL HIGH (ref 70–99)

## 2023-04-16 MED ORDER — LIDOCAINE-PRILOCAINE 2.5-2.5 % EX CREA: 1.0000 | TOPICAL_CREAM | CUTANEOUS | Status: DC | PRN

## 2023-04-16 MED ORDER — DICLOFENAC SODIUM 1 % EX GEL
2.0000 g | Freq: Four times a day (QID) | CUTANEOUS | Status: DC
  Administered 2023-04-16 – 2023-04-17 (×3): 2 g via TOPICAL
  Filled 2023-04-16: qty 100

## 2023-04-16 MED ORDER — PENTAFLUOROPROP-TETRAFLUOROETH EX AERO: 1.0000 | INHALATION_SPRAY | CUTANEOUS | Status: DC | PRN

## 2023-04-16 MED ORDER — HYDROXYZINE HCL 25 MG PO TABS
25.0000 mg | ORAL_TABLET | Freq: Every day | ORAL | Status: DC | PRN
  Administered 2023-04-17: 25 mg via ORAL
  Filled 2023-04-16: qty 1

## 2023-04-16 MED ORDER — HEPARIN SODIUM (PORCINE) 1000 UNIT/ML DIALYSIS
2000.0000 [IU] | INTRAMUSCULAR | Status: DC | PRN
  Administered 2023-04-16: 2000 [IU] via INTRAVENOUS_CENTRAL

## 2023-04-16 MED ORDER — DOXERCALCIFEROL 4 MCG/2ML IV SOLN
3.0000 ug | INTRAVENOUS | Status: DC
  Administered 2023-04-16: 3 ug via INTRAVENOUS
  Filled 2023-04-16: qty 2

## 2023-04-16 MED ORDER — ANTICOAGULANT SODIUM CITRATE 4% (200MG/5ML) IV SOLN: 5.0000 mL | Status: DC | PRN

## 2023-04-16 MED ORDER — ALTEPLASE 2 MG IJ SOLR: 2.0000 mg | Freq: Once | INTRAMUSCULAR | Status: DC | PRN

## 2023-04-16 MED ORDER — DARBEPOETIN ALFA 60 MCG/0.3ML IJ SOSY
60.0000 ug | PREFILLED_SYRINGE | Freq: Once | INTRAMUSCULAR | Status: AC
  Administered 2023-04-16: 60 ug via SUBCUTANEOUS
  Filled 2023-04-16: qty 0.3

## 2023-04-16 MED ORDER — LIDOCAINE HCL (PF) 1 % IJ SOLN: 5.0000 mL | INTRAMUSCULAR | Status: DC | PRN

## 2023-04-16 MED ORDER — LEVOTHYROXINE SODIUM 175 MCG PO TABS
175.0000 ug | ORAL_TABLET | Freq: Every day | ORAL | Status: DC
  Administered 2023-04-17: 175 ug via ORAL
  Filled 2023-04-16: qty 1

## 2023-04-16 MED ORDER — HYDROXYZINE HCL 25 MG PO TABS: 25.0000 mg | ORAL_TABLET | Freq: Three times a day (TID) | ORAL | Status: DC | PRN

## 2023-04-16 MED ORDER — HEPARIN SODIUM (PORCINE) 1000 UNIT/ML IJ SOLN
INTRAMUSCULAR | Status: AC
  Filled 2023-04-16: qty 2

## 2023-04-16 MED ORDER — SODIUM CHLORIDE 0.9 % IV SOLN
50.0000 mg | INTRAVENOUS | Status: AC
  Administered 2023-04-16: 50 mg via INTRAVENOUS
  Filled 2023-04-16: qty 2.5

## 2023-04-16 MED ORDER — HEPARIN SODIUM (PORCINE) 1000 UNIT/ML DIALYSIS: 1000.0000 [IU] | INTRAMUSCULAR | Status: DC | PRN

## 2023-04-16 NOTE — Progress Notes (Signed)
Heart Failure Navigator Progress Note  Assessed for Heart & Vascular TOC clinic readiness.  Patient does not meet criteria due to ESRD on hemodialysis.   Navigator will sign off at this time.    Brinleigh Tew, BSN, RN Heart Failure Nurse Navigator Secure Chat Only   

## 2023-04-16 NOTE — Consult Note (Signed)
ESRD Consult Note   Assessment/Recommendations:   ESRD -outpatient orders: [facility].  TTS.4 hours.  EDW 100.8 kg.  Flow rates: 400/600.  2K, 2 calcium.  LUE AVG.  Meds: Heparin 2000 units bolus, Venofer 50 mg once weekly, Mircera 60 mcg every 2 weeks (last dose 6/26), Hectorol 3 mcg every treatment -HD today on TTS schedule  Chest pain -cardiology following -echo with ef down to 35-40% -on DAPT+statin  B/l Knee pain -per primary  Volume/ hypertension:  -EDW 100.8kg. Will UF as tolerated  Anemia of Chronic Kidney Disease -hgb 10 -transfuse prn for hgb <7 -will give a dose of venofer 50mg  today and aranesp today  Secondary Hyperparathyroidism/Hyperphosphatemia -resume home binders -hectorol with HD resumed  Chronic respiratory failure, h/o COPD -per primary. On 2L [place] at baseline  DM2 with hyperglycemia -per primary  Dispo: pending SNF  Recommendations were discussed with the primary team.   History of Present Illness: [name] [name] is a/an 72 y.o. female with a past medical history of ESRD, chronic systolic and diastolic CHF, asthma, anemia of CKD, DM 2, chronic respiratory failure on 2 L [place] O2 at home, COPD, OSA, hypertension, PVD, obesity, hyperlipidemia, GERD, history of non-small cell lung cancer who presents initially with chest pain.  Chest pain started during dialysis this last Saturday, treatment was stopped prematurely due to pain.  Patient seen and examined this morning.  She is pending SNF placement.  She does report bilateral knee pain, unable to sleep.  She reports that she does still feel some heaviness in her chest which is unchanged.   Medications:  Current Facility-Administered Medications  Medication Dose Route Frequency Provider Last Rate Last Admin   acetaminophen (TYLENOL) tablet 650 mg  650 mg Oral Q4H PRN Kathlen Mody, MD   650 mg at 04/16/23 0609   albuterol (PROVENTIL) (2.5 MG/3ML) 0.083% nebulizer solution 2.5 mg  2.5 mg Inhalation  Q6H PRN Kathlen Mody, MD       allopurinol (ZYLOPRIM) tablet 100 mg  100 mg Oral Daily Kathlen Mody, MD   100 mg at 04/15/23 1610   aspirin chewable tablet 324 mg  324 mg Oral Once Kathlen Mody, MD       carvedilol (COREG) tablet 6.25 mg  6.25 mg Oral BID WC Lewayne Bunting, MD   6.25 mg at 04/15/23 1615   Chlorhexidine Gluconate Cloth 2 % PADS 6 each  6 each Topical Q0600 Anthony Sar, MD   6 each at 04/16/23 0609   clopidogrel (PLAVIX) tablet 75 mg  75 mg Oral Daily Kathlen Mody, MD   75 mg at 04/15/23 0917   feeding supplement (NEPRO CARB STEADY) liquid 237 mL  237 mL Oral BID BM Shah, Pratik D, DO       fluticasone (FLONASE) 50 MCG/ACT nasal spray 1 spray  1 spray Each Nare Daily Kathlen Mody, MD   1 spray at 04/15/23 1204   heparin injection 5,000 Units  5,000 Units Subcutaneous Q8H Kathlen Mody, MD   5,000 Units at 04/15/23 2118   HYDROmorphone (DILAUDID) tablet 1 mg  1 mg Oral Q6H PRN Sherryll Burger, Pratik D, DO   1 mg at 04/16/23 0612   insulin aspart (novoLOG) injection 0-6 Units  0-6 Units Subcutaneous TID WC Kathlen Mody, MD   1 Units at 04/15/23 1615   levothyroxine (SYNTHROID) tablet 200 mcg  200 mcg Oral Q0600 Kathlen Mody, MD   200 mcg at 04/16/23 0609   mometasone-formoterol (DULERA) 200-5 MCG/ACT inhaler 2 puff  2 puff Inhalation BID Kathlen Mody, MD   2 puff at 04/15/23 0753   multivitamin (RENA-VIT) tablet 1 tablet  1 tablet Oral QHS Sherryll Burger, Pratik D, DO   1 tablet at 04/15/23 2117   pantoprazole (PROTONIX) EC tablet 40 mg  40 mg Oral BID AC Kathlen Mody, MD   40 mg at 04/16/23 0609   sevelamer carbonate (RENVELA) tablet 1,600 mg  1,600 mg Oral TID WC Kathlen Mody, MD   1,600 mg at 04/16/23 0609     ALLERGIES Nebivolol, Zestril [lisinopril], Ace inhibitors, and Morphine and codeine  MEDICAL HISTORY Past Medical History:  Diagnosis Date   Anemia    Arthritis    Asthma    Chronic diastolic (congestive) heart failure (HCC)    Chronic kidney disease    Dialysis T-TH-Sat    Chronic pain syndrome    Class 2 obesity due to excess calories with body mass index (BMI) of 36.0 to 36.9 in adult    COPD (chronic obstructive pulmonary disease) (HCC)    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   Full dentures    GERD (gastroesophageal reflux disease)    Gout    History of hiatal hernia    History of transfusion    Hx MRSA infection    abscess left groin   Hyperlipidemia    Hypertension    Hypothyroid    Intractable nausea and vomiting 02/15/2021   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HCC)    Peripheral vascular disease (HCC)    Sleep apnea    UNABLE TO TOLERATE C PAP   Stress incontinence    TIA (transient ischemic attack)     X2 NO RESIDUAL PROBLEMS   Wears glasses      SOCIAL HISTORY Social History   Socioeconomic History   Marital status: Legally Separated    Spouse name: Not on file   Number of children: 1   Years of education: college   Highest education level: Not on file  Occupational History    Comment: Disabled  Tobacco Use   Smoking status: Former    Packs/day: 0.25    Years: 40.00    Additional pack years: 0.00    Total pack years: 10.00    Types: Cigarettes    Quit date: 10/2021    Years since quitting: 1.5    Passive exposure: Past   Smokeless tobacco: Former  Building services engineer Use: Never used  Substance and Sexual Activity   Alcohol use: Yes    Alcohol/week: 1.0 standard drink of alcohol    Types: 1 Standard drinks or equivalent per week    Comment: OCC   Drug use: No   Sexual activity: Not Currently    Birth control/protection: Surgical    Comment: Hysterectomy  Other Topics Concern   Not on file  Social History Narrative   ** Merged History Encounter **       Patient is separated  and lives at home with her friend. Patient is disabled. Education one year of college Right handed Caffeine two cups daily.     Social Determinants of Health   Financial Resource Strain: Not on file   Food Insecurity: No Food Insecurity (04/13/2023)   Hunger Vital Sign    Worried About Running Out of Food in the Last Year: Never true    Ran Out of Food in the Last Year: Never true  Transportation Needs: No Transportation Needs (04/13/2023)   PRAPARE -  Administrator, Civil Service (Medical): No    Lack of Transportation (Non-Medical): No  Physical Activity: Not on file  Stress: Not on file  Social Connections: Not on file  Intimate Partner Violence: Not At Risk (04/13/2023)   Humiliation, Afraid, Rape, and Kick questionnaire    Fear of Current or Ex-Partner: No    Emotionally Abused: No    Physically Abused: No    Sexually Abused: No     FAMILY HISTORY Family History  Problem Relation Age of Onset   Diabetes Brother    Cardiomyopathy Mother    Heart disease Mother    Cancer Father      Review of Systems: 12 systems were reviewed and negative except per HPI  Physical Exam: Vitals:   04/15/23 2308 04/16/23 0353  BP: 132/76 136/68  Pulse: (!) 102   Resp: 17 17  Temp: 97.8 F (36.6 C) 98.2 F (36.8 C)  SpO2: 95% 96%   Total I/O In: 240 [P.O.:240] Out: -   Intake/Output Summary (Last 24 hours) at 04/16/2023 0630 Last data filed at 04/16/2023 0600 Gross per 24 hour  Intake 240 ml  Output --  Net 240 ml   General: anxious-appearing, no acute distress HEENT: anicteric sclera, MMM CV: normal rate, no murmurs, no edema Lungs: bilateral chest rise, normal wob Abd: obese, soft, non-tender, non-distended Skin: no visible lesions or rashes Neuro: normal speech, no gross focal deficits  Dialysis access: LUE AVG +b/t  Test Results Reviewed Lab Results  Component Value Date   NA 134 (L) 04/15/2023   K 4.5 04/15/2023   CL 93 (L) 04/15/2023   CO2 29 04/15/2023   BUN 52 (H) 04/15/2023   CREATININE 8.07 (H) 04/15/2023   CALCIUM 8.1 (L) 04/15/2023   ALBUMIN 3.3 (L) 11/09/2022   PHOS 2.9 12/08/2021    I have reviewed relevant outside healthcare records

## 2023-04-16 NOTE — Progress Notes (Addendum)
Patient Name: [name] [name] Date of Encounter: 04/16/2023 [facility] Cardiologist: Nanetta Batty, MD   Interval Summary  .    72 y.o. female with past medical history of diabetes mellitus, hypertension, hyperlipidemia, end-stage renal disease dialysis dependent, COPD, peripheral vascular disease, prior TIA, non-small cell lung cancer status post radiation for evaluation of chest pain. Patient did have a cardiac catheterization in 2015 showing no significant coronary disease. Last echocardiogram February 2023 showed ejection fraction 40 to 45%, mild left ventricular enlargement, grade 1 diastolic dysfunction, severe left atrial enlargement, mild right atrial enlargement, interpreted as mild aortic stenosis with mean gradient 8 mmHg and aortic valve area 1.3 cm.   Now admitted with chest pain.   Today sleepy on dialysis, no chest pain or SOB   Vital Signs .    Vitals:   04/16/23 0742 04/16/23 0806 04/16/23 0832 04/16/23 0900  BP: (!) 143/67 (!) 123/55 (!) 118/52 (!) 101/41  Pulse: (!) 101 (!) 102 97 98  Resp: 16 15 17 16   Temp: (!) 97.5 F (36.4 C) (!) 97.4 F (36.3 C)    TempSrc: Axillary Oral    SpO2:  96% 97% 97%  Weight:  107.1 kg    Height:        Intake/Output Summary (Last 24 hours) at 04/16/2023 0926 Last data filed at 04/16/2023 0600 Gross per 24 hour  Intake 240 ml  Output --  Net 240 ml      04/16/2023    8:06 AM 04/13/2023    6:05 PM 12/15/2022   12:10 PM  Last 3 Weights  Weight (lbs) 236 lb 1.8 oz 226 lb 10.1 oz 210 lb  Weight (kg) 107.1 kg 102.8 kg 95.255 kg      Telemetry/ECG    SR  occ PVCs - Personally Reviewed  Echo 04/15/23 IMPRESSIONS     1. Left ventricular ejection fraction, by estimation, is 35 to 40%. The  left ventricle has moderately decreased function. The left ventricle  demonstrates global hypokinesis. The left ventricular internal cavity size  was mildly dilated. Left ventricular  diastolic parameters are consistent with  Grade I diastolic dysfunction  (impaired relaxation).   2. Right ventricular systolic function is normal. The right ventricular  size is normal. There is moderately elevated pulmonary artery systolic  pressure. The estimated right ventricular systolic pressure is 47.3 mmHg.   3. Left atrial size was mildly dilated.   4. The mitral valve is normal in structure. Trivial mitral valve  regurgitation. No evidence of mitral stenosis.   5. The aortic valve is tricuspid. Aortic valve regurgitation is not  visualized. No aortic stenosis is present. Aortic valve mean gradient  measures 7.0 mmHg.   6. The inferior vena cava is dilated in size with <50% respiratory  variability, suggesting right atrial pressure of 15 mmHg.   FINDINGS   Left Ventricle: Left ventricular ejection fraction, by estimation, is 35  to 40%. The left ventricle has moderately decreased function. The left  ventricle demonstrates global hypokinesis. Definity contrast agent was  given IV to delineate the left  ventricular endocardial borders. The left ventricular internal cavity size  was mildly dilated. There is no left ventricular hypertrophy. Left  ventricular diastolic parameters are consistent with Grade I diastolic  dysfunction (impaired relaxation).   Right Ventricle: The right ventricular size is normal. No increase in  right ventricular wall thickness. Right ventricular systolic function is  normal. There is moderately elevated pulmonary artery systolic pressure.  The tricuspid  regurgitant velocity is  2.84 m/s, and with an assumed right atrial pressure of 15 mmHg, the  estimated right ventricular systolic pressure is 47.3 mmHg.   Left Atrium: Left atrial size was mildly dilated.   Right Atrium: Right atrial size was normal in size.   Pericardium: There is no evidence of pericardial effusion.   Mitral Valve: The mitral valve is normal in structure. Trivial mitral  valve regurgitation. No evidence of mitral  valve stenosis.   Tricuspid Valve: The tricuspid valve is normal in structure. Tricuspid  valve regurgitation is trivial.   Aortic Valve: The aortic valve is tricuspid. Aortic valve regurgitation is  not visualized. No aortic stenosis is present. Aortic valve mean gradient  measures 7.0 mmHg. Aortic valve peak gradient measures 13.4 mmHg. Aortic  valve area, by VTI measures 2.52   cm.   Pulmonic Valve: The pulmonic valve was normal in structure. Pulmonic valve  regurgitation is not visualized.   Aorta: The aortic root is normal in size and structure.   Venous: The inferior vena cava is dilated in size with less than 50%  respiratory variability, suggesting right atrial pressure of 15 mmHg.   IAS/Shunts: No atrial level shunt detected by color flow Doppler.    Physical Exam .   GEN: No acute distress.   Neck: No JVD Cardiac: RRR, no murmurs, rubs, or gallops.  Respiratory: Clear to auscultation bilaterally. GI: Soft, nontender, non-distended  MS: No edema  Assessment & Plan .     Chest pain with elevated troponin- though flat  --echo with EF 35-40% down from 40-45% G1DD  --most likely plan outpt cPET scan  ESRD- per Nephrology  HTN -143/67 prior to dialysis now 96 systolic  --on BB and did not tolerate higher doses  Chronic combined systolic and diastolic HF  --volume per dialysis  For questions or updates, please contact [facility] Please consult [email or web address] for contact info under        Signed, Nada Boozer, NP   History and all data above reviewed.  Patient examined.  I agree with the findings as above.  Echo as above. She says the chest pain is still there.  Seems to be more confused this morning.  The patient exam reveals COR:RRR  ,  Lungs: Decreased breath sounds  ,  Abd: Positive bowel sounds, no rebound no guarding, Ext No edema  .  All available labs, radiology testing, previous records reviewed. Agree with documented assessment and plan.  Chest pain:  I reviewed the echo images comparing 2024 to 23 and there are no new wall motion abnormalities.  The EF might be slightly lower.  Plan out patient PET scan.       Fayrene Fearing [facility]  10:43 AM  04/16/2023

## 2023-04-16 NOTE — Progress Notes (Addendum)
Received patient in bed.Awake,alert and oriented x 4.Consent verified.  Access used: Left upper arm AVG that worked well.  Medicines given: Heparin 2,000 units pre-run dose.                             Hectorol 3 mcg.                             Venofer  Duration of treatment: 4 hours.  Fluid removed : 3.6 liters.  Hemo comment:Not tolerating on UF goal of 4 liters,SBP were dropping during the last hour of treatment.  Hand off to the patient's nurse.

## 2023-04-16 NOTE — Discharge Instructions (Signed)
The office will call you about a cardiac Pet Scan that will be done at [facility]   this is to evaluate your heart.

## 2023-04-16 NOTE — Progress Notes (Signed)
PROGRESS NOTE    [name] [name]  ZOX:[id number] DOB: 1950-10-23 DOA: 04/13/2023 PCP: Vladimir Crofts, FNP   Brief Narrative:    [name] [name] is a 72 y.o. 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 Patient reports that 2 1/2 hours in the HD session, she started having substernal chest pain, ( she had some chest pressure earlier this morning), at which point the HD was stopped and she was brought to ED for further evaluation. Cardiology following and 2D echo with LVEF 35-40%. Cardiology planning for outpatient cPET scan. Now awaiting SNF placement too.  Assessment & Plan:   Principal Problem:   Chest pain Active Problems:   PAD (peripheral artery disease) (HCC)   Class 2 obesity due to excess calories with body mass index (BMI) of 36.0 to 36.9 in adult   Hyperlipidemia   COPD with asthma   Hypothyroidism   OSA (obstructive sleep apnea)   DM (diabetes mellitus), type 2 with renal complications (HCC)   Anemia, unspecified   ESRD (end stage renal disease) on dialysis (HCC)   Lung cancer, lower lobe (HCC)  Assessment and Plan:   Chest pain/ pressure at rest , not related to activity Improved.  Troponin elevated at 107, 102.  Echocardiogram with LVEF 35-40% from 40-45% prior Cardiology planning for outpt cPET scan Continue with aspirin, plavix and statin and coreg.      H/o chronic systolic and diastolic CHF;  Fluid management as per HD.  Echo as above     ESRD on HD  Routine HD per nephrology today     Asthma/ COPD/ chronic respiratory failure with hypoxic on 2 lit of [place] oxygen at home.  Squamous cell ca of the left lower lobe of the  lung S/p radiation treatment  Follows up with Dr Arbutus Ped.    H/o PVD  Continue with aspirin, plavix and statin.      Hypertension:  bP parameters are optimal.      OSA;  Obesity:  Outpatient follow upwith PCP .      Anemia of CKD;  Hemoglobin stable around 10.7.    Insulin dependent DM: Start her on SSI.  A1c 7.1%     Hypothyroidism:  Resume synthroid.    Morbid obesity BMI 40.15   DVT prophylaxis:Heparin Code Status: Full Family Communication: None at bedside Disposition Plan:  Status is: Inpatient Remains inpatient appropriate because: Need for SNF placement     Consultants:  Cardiology Nephrology  Procedures:  None  Antimicrobials:  None   Subjective: Patient seen and evaluated today with no new acute complaints or concerns. No acute concerns or events noted overnight. She states she has some all over pain and especially knee pain.  Objective: Vitals:   04/16/23 1245 04/16/23 1321 04/16/23 1353 04/16/23 1357  BP: 111/68  (!) 113/55 (!) 113/55  Pulse: (!) 106  (!) 115 (!) 114  Resp: (!) 22  12 19   Temp: (!) 97.4 F (36.3 C)   97.6 F (36.4 C)  TempSrc: Oral   Oral  SpO2: 97%  93% 98%  Weight:  103.7 kg    Height:        Intake/Output  Summary (Last 24 hours) at 04/16/2023 1413 Last data filed at 04/16/2023 1238 Gross per 24 hour  Intake 240 ml  Output 3600 ml  Net -3360 ml   Filed Weights   04/13/23 1805 04/16/23 0806 04/16/23 1321  Weight: 102.8 kg 107.1 kg 103.7 kg    Examination:  General exam: Appears calm and comfortable, morbidly obese Respiratory system: Clear to auscultation. Respiratory effort normal. 3L [place] Cardiovascular system: S1 & S2 heard, RRR.  Gastrointestinal system: Abdomen is soft Central nervous system: Alert and awake Extremities: No edema Skin: No significant lesions noted Psychiatry: Flat affect.    Data Reviewed: I have personally reviewed following labs and imaging studies  CBC: Recent Labs  Lab  04/13/23 1028 04/14/23 0035 04/15/23 0027 04/16/23 0905  WBC 7.8 7.1 7.4 8.6  NEUTROABS  --  5.8  --   --   HGB 10.7* 9.9* 10.0* 10.1*  HCT 37.1 34.4* 35.5* 34.6*  MCV 104.5* 105.8* 106.9* 106.5*  PLT 174 148* 163 149*   Basic Metabolic Panel: Recent Labs  Lab 04/13/23 1028 04/14/23 0035 04/15/23 0027 04/16/23 0657  NA 135 134* 134* 133*  K 3.8 4.2 4.5 5.0  CL 92* 97* 93* 92*  CO2 27 26 29 28   GLUCOSE 166* 241* 179* 164*  BUN 35* 39* 52* 66*  CREATININE 5.37* 6.38* 8.07* 9.38*  CALCIUM 8.1* 8.3* 8.1* 8.3*  MG  --   --  1.9  --   PHOS  --   --   --  6.8*   GFR: Estimated Creatinine Clearance: 6.2 mL/min (A) (by C-G formula based on SCr of 9.38 mg/dL (H)). Liver Function Tests: Recent Labs  Lab 04/16/23 0657  ALBUMIN 3.0*   No results for input(s): "LIPASE", "AMYLASE" in the last 168 hours. No results for input(s): "AMMONIA" in the last 168 hours. Coagulation Profile: No results for input(s): "INR", "PROTIME" in the last 168 hours. Cardiac Enzymes: No results for input(s): "CKTOTAL", "CKMB", "CKMBINDEX", "TROPONINI" in the last 168 hours. BNP (last 3 results) No results for input(s): "PROBNP" in the last 8760 hours. HbA1C: Recent Labs    04/13/23 1858  HGBA1C 7.1*   CBG: Recent Labs  Lab 04/15/23 1112 04/15/23 1600 04/15/23 2120 04/16/23 0606 04/16/23 1345  GLUCAP 213* 172* 216* 136* 135*   Lipid Profile: Recent Labs    04/13/23 1858  CHOL 160  HDL 28*  LDLCALC 78  TRIG 161*  CHOLHDL 5.7   Thyroid Function Tests: No results for input(s): "TSH", "T4TOTAL", "FREET4", "T3FREE", "THYROIDAB" in the last 72 hours. Anemia Panel: No results for input(s): "VITAMINB12", "FOLATE", "FERRITIN", "TIBC", "IRON", "RETICCTPCT" in the last 72 hours. Sepsis Labs: No results for input(s): "PROCALCITON", "LATICACIDVEN" in the last 168 hours.  No results found for this or any previous visit (from the past 240 hour(s)).       Radiology  Studies: ECHOCARDIOGRAM COMPLETE  Result Date: 04/15/2023    ECHOCARDIOGRAM REPORT   Patient Name:   [name] [name] Date of Exam: 04/15/2023 Medical Rec #:  [id number]         Height:       63.0 in Accession #:    [id number]        Weight:       226.6 lb Date of Birth:  1951/04/03          BSA:          2.039 m Patient Age:    72 years  BP:           118/50 mmHg Patient Gender: F                 HR:           113 bpm. Exam Location:  Inpatient Procedure: 2D Echo, Cardiac Doppler, Color Doppler and Intracardiac            Opacification Agent Indications:    CHF-Acute Diastolic I50.31  History:        Patient has prior history of Echocardiogram examinations, most                 recent 11/22/2021. CHF, PAD, TIA and COPD, Signs/Symptoms:Chest                 Pain and Syncope; Risk Factors:Hypertension, Sleep Apnea,                 Diabetes, Dyslipidemia and Current Smoker. ESRD.  Sonographer:    Lucendia Herrlich Referring Phys: [id number] Elim Peale D [facility] IMPRESSIONS  1. Left ventricular ejection fraction, by estimation, is 35 to 40%. The left ventricle has moderately decreased function. The left ventricle demonstrates global hypokinesis. The left ventricular internal cavity size was mildly dilated. Left ventricular diastolic parameters are consistent with Grade I diastolic dysfunction (impaired relaxation).  2. Right ventricular systolic function is normal. The right ventricular size is normal. There is moderately elevated pulmonary artery systolic pressure. The estimated right ventricular systolic pressure is 47.3 mmHg.  3. Left atrial size was mildly dilated.  4. The mitral valve is normal in structure. Trivial mitral valve regurgitation. No evidence of mitral stenosis.  5. The aortic valve is tricuspid. Aortic valve regurgitation is not visualized. No aortic stenosis is present. Aortic valve mean gradient measures 7.0 mmHg.  6. The inferior vena cava is dilated in size with <50% respiratory variability,  suggesting right atrial pressure of 15 mmHg. FINDINGS  Left Ventricle: Left ventricular ejection fraction, by estimation, is 35 to 40%. The left ventricle has moderately decreased function. The left ventricle demonstrates global hypokinesis. Definity contrast agent was given IV to delineate the left ventricular endocardial borders. The left ventricular internal cavity size was mildly dilated. There is no left ventricular hypertrophy. Left ventricular diastolic parameters are consistent with Grade I diastolic dysfunction (impaired relaxation). Right Ventricle: The right ventricular size is normal. No increase in right ventricular wall thickness. Right ventricular systolic function is normal. There is moderately elevated pulmonary artery systolic pressure. The tricuspid regurgitant velocity is 2.84 m/s, and with an assumed right atrial pressure of 15 mmHg, the estimated right ventricular systolic pressure is 47.3 mmHg. Left Atrium: Left atrial size was mildly dilated. Right Atrium: Right atrial size was normal in size. Pericardium: There is no evidence of pericardial effusion. Mitral Valve: The mitral valve is normal in structure. Trivial mitral valve regurgitation. No evidence of mitral valve stenosis. Tricuspid Valve: The tricuspid valve is normal in structure. Tricuspid valve regurgitation is trivial. Aortic Valve: The aortic valve is tricuspid. Aortic valve regurgitation is not visualized. No aortic stenosis is present. Aortic valve mean gradient measures 7.0 mmHg. Aortic valve peak gradient measures 13.4 mmHg. Aortic valve area, by VTI measures 2.52  cm. Pulmonic Valve: The pulmonic valve was normal in structure. Pulmonic valve regurgitation is not visualized. Aorta: The aortic root is normal in size and structure. Venous: The inferior vena cava is dilated in size with less than 50% respiratory variability, suggesting right atrial pressure of 15 mmHg. IAS/Shunts: No  atrial level shunt detected by color flow  Doppler.  LEFT VENTRICLE PLAX 2D LVIDd:         5.90 cm   Diastology LVIDs:         5.00 cm   LV e' medial:    6.64 cm/s LV PW:         1.10 cm   LV E/e' medial:  13.4 LV IVS:        0.90 cm   LV e' lateral:   8.05 cm/s LVOT diam:     2.00 cm   LV E/e' lateral: 11.1 LV SV:         81 LV SV Index:   40 LVOT Area:     3.14 cm  RIGHT VENTRICLE             IVC RV S prime:     12.10 cm/s  IVC diam: 2.30 cm TAPSE (M-mode): 2.1 cm LEFT ATRIUM           Index        RIGHT ATRIUM           Index LA diam:      4.90 cm 2.40 cm/m   RA Area:     13.50 cm LA Vol (A2C): 50.1 ml 24.57 ml/m  RA Volume:   31.40 ml  15.40 ml/m LA Vol (A4C): 78.0 ml 38.25 ml/m  AORTIC VALVE AV Area (Vmax):    2.48 cm AV Area (Vmean):   2.45 cm AV Area (VTI):     2.52 cm AV Vmax:           183.00 cm/s AV Vmean:          125.000 cm/s AV VTI:            0.322 m AV Peak Grad:      13.4 mmHg AV Mean Grad:      7.0 mmHg LVOT Vmax:         144.67 cm/s LVOT Vmean:        97.333 cm/s LVOT VTI:          0.258 m LVOT/AV VTI ratio: 0.80  AORTA Ao Root diam: 3.00 cm Ao Asc diam:  3.10 cm MITRAL VALVE                TRICUSPID VALVE MV Area (PHT): 4.49 cm     TR Peak grad:   32.3 mmHg MV Decel Time: 169 msec     TR Vmax:        284.00 cm/s MV E velocity: 89.30 cm/s MV A velocity: 108.00 cm/s  SHUNTS MV E/A ratio:  0.83         Systemic VTI:  0.26 m                             Systemic Diam: 2.00 cm Dalton McleanMD Electronically signed by Wilfred Lacy Signature Date/Time: 04/15/2023/5:22:34 PM    Final         Scheduled Meds:  allopurinol  100 mg Oral Daily   aspirin  324 mg Oral Once   carvedilol  6.25 mg Oral BID WC   Chlorhexidine Gluconate Cloth  6 each Topical Q0600   clopidogrel  75 mg Oral Daily   doxercalciferol  3 mcg Intravenous Q T,Th,Sa-HD   feeding supplement (NEPRO CARB STEADY)  237 mL Oral BID BM   fluticasone  1 spray Each Nare Daily   heparin  5,000 Units Subcutaneous Q8H   insulin aspart  0-6 Units Subcutaneous TID WC    [START ON 04/17/2023] levothyroxine  175 mcg Oral Q0600   mometasone-formoterol  2 puff Inhalation BID   multivitamin  1 tablet Oral QHS   pantoprazole  40 mg Oral BID AC   sevelamer carbonate  1,600 mg Oral TID WC     LOS: 0 days    Time spent: 35 minutes    [name] Hipp Hoover Brunette, DO [facility]  If 7PM-7AM, please contact night-coverage [email or web address] 04/16/2023, 2:13 PM

## 2023-04-16 NOTE — TOC Progression Note (Signed)
Transition of Care [facility]) - Progression Note    Patient Details  Name: SYLVIE MIFSUD MRN: [id number] Date of Birth: 29-Sep-1951  Transition [facility] CM/SW Contact  Hoke Baer A [place], [place] Phone Number: 04/16/2023, 3:21 PM  Clinical Narrative:     Update 1030  CSW met with pt at bedside and followed up regarding bed choice. She stated that she selected [facility] for short term rehab. CSW updated facility and they were able to provide bed today.   TOC will continue to follow.   CSW met with pt at bedside to provide bed offers. Pt was able to be roused but was unable to make a decision as she said she was too tired from HD earlier in the day; even with proving information on preferred bed offer to [facility]. CSW will follow up at another more opportune time.   TOC will continue to follow.   Expected Discharge Plan: Skilled Nursing Facility Barriers to Discharge: Continued Medical Work up  Expected Discharge Plan and Services       Living arrangements for the past 2 months: Apartment                                       Social Determinants of Health (SDOH) Interventions SDOH Screenings   Food Insecurity: No Food Insecurity (04/13/2023)  Housing: Medium Risk (04/13/2023)  Transportation Needs: No Transportation Needs (04/13/2023)  Utilities: Not At Risk (04/13/2023)  Tobacco Use: Medium Risk (04/13/2023)    Readmission Risk Interventions     No data to display

## 2023-04-16 NOTE — Progress Notes (Signed)
   04/16/23 1455  Provider Notification  Provider Name/Title Dr Sherryll Burger and Dr Antoine Poche  Date Provider Notified 04/16/23  Time Provider Notified 1450  Method of Notification Page  Notification Reason Change in status  Provider response Evaluate remotely  Date of Provider Response 04/16/23  Time of Provider Response 1450   Notified Mds' of [place].

## 2023-04-16 NOTE — Progress Notes (Addendum)
Contacted by CSW with request for a later out-pt HD appt time. Contacted staff at [facility] with request and awaiting a response.   Olivia Canter Renal Navigator [phone]  Addendum at 3:24 pm: [facility] has a TTS 12:00 chair time available if snf can transport at that time. Info provided to CSW who confirmed with snf that they can transport pt to 12:00 chair time. Clinic can start pt at new time on Thursday. Pt will need to arrive at 11:40 for 12:00 chair time. CSW aware of times and to provide to snf.

## 2023-04-17 DIAGNOSIS — N186 End stage renal disease: Secondary | ICD-10-CM | POA: Diagnosis not present

## 2023-04-17 DIAGNOSIS — Z992 Dependence on renal dialysis: Secondary | ICD-10-CM | POA: Diagnosis not present

## 2023-04-17 DIAGNOSIS — R072 Precordial pain: Secondary | ICD-10-CM | POA: Diagnosis not present

## 2023-04-17 DIAGNOSIS — R079 Chest pain, unspecified: Secondary | ICD-10-CM | POA: Diagnosis not present

## 2023-04-17 LAB — BASIC METABOLIC PANEL
Anion gap: 14 (ref 5–15)
BUN: 38 mg/dL — ABNORMAL HIGH (ref 8–23)
CO2: 26 mmol/L (ref 22–32)
Calcium: 8.8 mg/dL — ABNORMAL LOW (ref 8.9–10.3)
Chloride: 93 mmol/L — ABNORMAL LOW (ref 98–111)
Creatinine, Ser: 6.13 mg/dL — ABNORMAL HIGH (ref 0.44–1.00)
GFR, Estimated: 7 mL/min — ABNORMAL LOW (ref >60)
Glucose, Bld: 184 mg/dL — ABNORMAL HIGH (ref 70–99)
Potassium: 4.3 mmol/L (ref 3.5–5.1)
Sodium: 133 mmol/L — ABNORMAL LOW (ref 135–145)

## 2023-04-17 LAB — CBC
HCT: 34.5 % — ABNORMAL LOW (ref 36.0–46.0)
Hemoglobin: 9.8 g/dL — ABNORMAL LOW (ref 12.0–15.0)
MCH: 29.7 pg (ref 26.0–34.0)
MCHC: 28.4 g/dL — ABNORMAL LOW (ref 30.0–36.0)
MCV: 104.5 fL — ABNORMAL HIGH (ref 80.0–100.0)
Platelets: 141 10*3/uL — ABNORMAL LOW (ref 150–400)
RBC: 3.3 MIL/uL — ABNORMAL LOW (ref 3.87–5.11)
RDW: 15.4 % (ref 11.5–15.5)
WBC: 6.9 10*3/uL (ref 4.0–10.5)
nRBC: 0 % (ref 0.0–0.2)

## 2023-04-17 LAB — GLUCOSE, CAPILLARY
Glucose-Capillary: 139 mg/dL — ABNORMAL HIGH (ref 70–99)
Glucose-Capillary: 160 mg/dL — ABNORMAL HIGH (ref 70–99)

## 2023-04-17 LAB — MAGNESIUM: Magnesium: 1.8 mg/dL (ref 1.7–2.4)

## 2023-04-17 LAB — HEPATITIS B SURFACE ANTIBODY, QUANTITATIVE: Hep B S AB Quant (Post): 12.6 m[IU]/mL

## 2023-04-17 MED ORDER — NEPRO/CARBSTEADY PO LIQD: 237.0000 mL | Freq: Two times a day (BID) | ORAL | 0 refills | Status: DC

## 2023-04-17 MED ORDER — LIDOCAINE 5 % EX PTCH
1.0000 | MEDICATED_PATCH | CUTANEOUS | Status: DC
  Administered 2023-04-17: 1 via TRANSDERMAL
  Filled 2023-04-17: qty 1

## 2023-04-17 MED ORDER — CARVEDILOL 6.25 MG PO TABS: 6.2500 mg | ORAL_TABLET | Freq: Two times a day (BID) | ORAL | Status: DC

## 2023-04-17 MED ORDER — INSULIN ASPART 100 UNIT/ML IJ SOLN: 0.0000 [IU] | Freq: Three times a day (TID) | INTRAMUSCULAR | 11 refills | Status: DC

## 2023-04-17 MED ORDER — SEVELAMER CARBONATE 800 MG PO TABS: 1600.0000 mg | ORAL_TABLET | Freq: Three times a day (TID) | ORAL | Status: DC

## 2023-04-17 MED ORDER — MAGNESIUM SULFATE 2 GM/50ML IV SOLN
2.0000 g | Freq: Once | INTRAVENOUS | Status: AC
  Administered 2023-04-17: 2 g via INTRAVENOUS
  Filled 2023-04-17: qty 50

## 2023-04-17 MED ORDER — DICLOFENAC SODIUM 1 % EX GEL: 2.0000 g | Freq: Four times a day (QID) | CUTANEOUS | Status: DC

## 2023-04-17 MED ORDER — DIPHENHYDRAMINE HCL 50 MG/ML IJ SOLN: 12.5000 mg | Freq: Once | INTRAMUSCULAR | Status: DC

## 2023-04-17 NOTE — Progress Notes (Signed)
D/C order noted. Contacted [facility] to advise clinic of pt's d/c today and that pt will resume care tomorrow at new 12:00 chair time. HD appointments added to AVS as well.   Olivia Canter Renal Navigator [phone]

## 2023-04-17 NOTE — Progress Notes (Signed)
Progress Note  Patient Name: [name] [name] Date of Encounter: 04/17/2023  Primary Cardiologist:   Nanetta Batty, MD   Subjective   No chest pain.  She is complaining of itching all over and thinks she is allergic to something.   Inpatient Medications    Scheduled Meds:  allopurinol  100 mg Oral Daily   aspirin  324 mg Oral Once   carvedilol  6.25 mg Oral BID WC   Chlorhexidine Gluconate Cloth  6 each Topical Q0600   clopidogrel  75 mg Oral Daily   diclofenac Sodium  2 g Topical QID   doxercalciferol  3 mcg Intravenous Q T,Th,Sa-HD   feeding supplement (NEPRO CARB STEADY)  237 mL Oral BID BM   fluticasone  1 spray Each Nare Daily   heparin  5,000 Units Subcutaneous Q8H   insulin aspart  0-6 Units Subcutaneous TID WC   levothyroxine  175 mcg Oral Q0600   lidocaine  1 patch Transdermal Q24H   mometasone-formoterol  2 puff Inhalation BID   multivitamin  1 tablet Oral QHS   pantoprazole  40 mg Oral BID AC   sevelamer carbonate  1,600 mg Oral TID WC   Continuous Infusions:  PRN Meds: acetaminophen, albuterol, hydrOXYzine   Vital Signs    Vitals:   04/16/23 1600 04/16/23 2028 04/16/23 2300 04/17/23 0440  BP: (!) 118/42 110/62 (!) 120/48 (!) 112/53  Pulse: 96 (!) 101 (!) 101 96  Resp: 17 19 15 20   Temp:  97.6 F (36.4 C) 98 F (36.7 C) 98.1 F (36.7 C)  TempSrc:  Oral Oral Oral  SpO2: 93% 94% 97% 96%  Weight:      Height:        Intake/Output Summary (Last 24 hours) at 04/17/2023 0744 Last data filed at 04/16/2023 1238 Gross per 24 hour  Intake --  Output 3600 ml  Net -3600 ml   Filed Weights   04/13/23 1805 04/16/23 0806 04/16/23 1321  Weight: 102.8 kg 107.1 kg 103.7 kg    Telemetry    NSR, ST - Personally Reviewed  ECG    NA - Personally Reviewed  Physical Exam   GEN: No acute distress.   Neck: No  JVD Cardiac: RRR, no murmurs, rubs, or gallops.  Respiratory: Clear  to auscultation bilaterally. GI: Soft, nontender, non-distended  MS:  No  edema; No deformity. Neuro:  Nonfocal  Psych: Normal affect   Labs    Chemistry Recent Labs  Lab 04/15/23 0027 04/16/23 0657 04/17/23 0040  NA 134* 133* 133*  K 4.5 5.0 4.3  CL 93* 92* 93*  CO2 29 28 26   GLUCOSE 179* 164* 184*  BUN 52* 66* 38*  CREATININE 8.07* 9.38* 6.13*  CALCIUM 8.1* 8.3* 8.8*  ALBUMIN  --  3.0*  --   GFRNONAA 5* 4* 7*  ANIONGAP 12 13 14      Hematology Recent Labs  Lab 04/15/23 0027 04/16/23 0905 04/17/23 0040  WBC 7.4 8.6 6.9  RBC 3.32* 3.25* 3.30*  HGB 10.0* 10.1* 9.8*  HCT 35.5* 34.6* 34.5*  MCV 106.9* 106.5* 104.5*  MCH 30.1 31.1 29.7  MCHC 28.2* 29.2* 28.4*  RDW 15.8* 15.2 15.4  PLT 163 149* 141*    Cardiac EnzymesNo results for input(s): "TROPONINI" in the last 168 hours. No results for input(s): "TROPIPOC" in the last 168 hours.   BNPNo results for input(s): "BNP", "PROBNP" in the last 168 hours.   DDimer No results for input(s): "DDIMER" in the last  168 hours.   Radiology    ECHOCARDIOGRAM COMPLETE  Result Date: 04/15/2023    ECHOCARDIOGRAM REPORT   Patient Name:   [name] [name] Date of Exam: 04/15/2023 Medical Rec #:  [id number]         Height:       63.0 in Accession #:    [id number]        Weight:       226.6 lb Date of Birth:  November 10, 1950          BSA:          2.039 m Patient Age:    72 years          BP:           118/50 mmHg Patient Gender: F                 HR:           113 bpm. Exam Location:  Inpatient Procedure: 2D Echo, Cardiac Doppler, Color Doppler and Intracardiac            Opacification Agent Indications:    CHF-Acute Diastolic I50.31  History:        Patient has prior history of Echocardiogram examinations, most                 recent 11/22/2021. CHF, PAD, TIA and COPD, Signs/Symptoms:Chest                 Pain and Syncope; Risk Factors:Hypertension, Sleep Apnea,                 Diabetes, Dyslipidemia and Current Smoker. ESRD.  Sonographer:    Lucendia Herrlich Referring Phys: [id number] PRATIK D [facility] IMPRESSIONS  1. Left  ventricular ejection fraction, by estimation, is 35 to 40%. The left ventricle has moderately decreased function. The left ventricle demonstrates global hypokinesis. The left ventricular internal cavity size was mildly dilated. Left ventricular diastolic parameters are consistent with Grade I diastolic dysfunction (impaired relaxation).  2. Right ventricular systolic function is normal. The right ventricular size is normal. There is moderately elevated pulmonary artery systolic pressure. The estimated right ventricular systolic pressure is 47.3 mmHg.  3. Left atrial size was mildly dilated.  4. The mitral valve is normal in structure. Trivial mitral valve regurgitation. No evidence of mitral stenosis.  5. The aortic valve is tricuspid. Aortic valve regurgitation is not visualized. No aortic stenosis is present. Aortic valve mean gradient measures 7.0 mmHg.  6. The inferior vena cava is dilated in size with <50% respiratory variability, suggesting right atrial pressure of 15 mmHg. FINDINGS  Left Ventricle: Left ventricular ejection fraction, by estimation, is 35 to 40%. The left ventricle has moderately decreased function. The left ventricle demonstrates global hypokinesis. Definity contrast agent was given IV to delineate the left ventricular endocardial borders. The left ventricular internal cavity size was mildly dilated. There is no left ventricular hypertrophy. Left ventricular diastolic parameters are consistent with Grade I diastolic dysfunction (impaired relaxation). Right Ventricle: The right ventricular size is normal. No increase in right ventricular wall thickness. Right ventricular systolic function is normal. There is moderately elevated pulmonary artery systolic pressure. The tricuspid regurgitant velocity is 2.84 m/s, and with an assumed right atrial pressure of 15 mmHg, the estimated right ventricular systolic pressure is 47.3 mmHg. Left Atrium: Left atrial size was mildly dilated. Right Atrium:  Right atrial size was normal in size. Pericardium: There is no evidence of pericardial effusion. Mitral Valve: The mitral  valve is normal in structure. Trivial mitral valve regurgitation. No evidence of mitral valve stenosis. Tricuspid Valve: The tricuspid valve is normal in structure. Tricuspid valve regurgitation is trivial. Aortic Valve: The aortic valve is tricuspid. Aortic valve regurgitation is not visualized. No aortic stenosis is present. Aortic valve mean gradient measures 7.0 mmHg. Aortic valve peak gradient measures 13.4 mmHg. Aortic valve area, by VTI measures 2.52  cm. Pulmonic Valve: The pulmonic valve was normal in structure. Pulmonic valve regurgitation is not visualized. Aorta: The aortic root is normal in size and structure. Venous: The inferior vena cava is dilated in size with less than 50% respiratory variability, suggesting right atrial pressure of 15 mmHg. IAS/Shunts: No atrial level shunt detected by color flow Doppler.  LEFT VENTRICLE PLAX 2D LVIDd:         5.90 cm   Diastology LVIDs:         5.00 cm   LV e' medial:    6.64 cm/s LV PW:         1.10 cm   LV E/e' medial:  13.4 LV IVS:        0.90 cm   LV e' lateral:   8.05 cm/s LVOT diam:     2.00 cm   LV E/e' lateral: 11.1 LV SV:         81 LV SV Index:   40 LVOT Area:     3.14 cm  RIGHT VENTRICLE             IVC RV S prime:     12.10 cm/s  IVC diam: 2.30 cm TAPSE (M-mode): 2.1 cm LEFT ATRIUM           Index        RIGHT ATRIUM           Index LA diam:      4.90 cm 2.40 cm/m   RA Area:     13.50 cm LA Vol (A2C): 50.1 ml 24.57 ml/m  RA Volume:   31.40 ml  15.40 ml/m LA Vol (A4C): 78.0 ml 38.25 ml/m  AORTIC VALVE AV Area (Vmax):    2.48 cm AV Area (Vmean):   2.45 cm AV Area (VTI):     2.52 cm AV Vmax:           183.00 cm/s AV Vmean:          125.000 cm/s AV VTI:            0.322 m AV Peak Grad:      13.4 mmHg AV Mean Grad:      7.0 mmHg LVOT Vmax:         144.67 cm/s LVOT Vmean:        97.333 cm/s LVOT VTI:          0.258 m LVOT/AV  VTI ratio: 0.80  AORTA Ao Root diam: 3.00 cm Ao Asc diam:  3.10 cm MITRAL VALVE                TRICUSPID VALVE MV Area (PHT): 4.49 cm     TR Peak grad:   32.3 mmHg MV Decel Time: 169 msec     TR Vmax:        284.00 cm/s MV E velocity: 89.30 cm/s MV A velocity: 108.00 cm/s  SHUNTS MV E/A ratio:  0.83         Systemic VTI:  0.26 m  Systemic Diam: 2.00 cm Dalton McleanMD Electronically signed by Wilfred Lacy Signature Date/Time: 04/15/2023/5:22:34 PM    Final     Cardiac Studies   ECHO:  See above  Patient Profile     72 y.o. female with past medical history of diabetes mellitus, hypertension, hyperlipidemia, end-stage renal disease dialysis dependent, COPD, peripheral vascular disease, prior TIA, non-small cell lung cancer status post radiation for evaluation of chest pain. Patient did have a cardiac catheterization in 2015 showing no significant coronary disease. Last echocardiogram February 2023 showed ejection fraction 40 to 45%, mild left ventricular enlargement, grade 1 diastolic dysfunction, severe left atrial enlargement, mild right atrial enlargement, interpreted as mild aortic stenosis with mean gradient 8 mmHg and aortic valve area 1.3 cm. Admitted with chest pain.    Assessment & Plan    Chest pain with elevated troponin:   EF 35 - 40%.  Reviewed personally and about the same or possibly slightly lower than previous but without new wall motion abnormalities.  Pain is very difficult for the patient to quantify or qualify.  Plan out patient PET scanning.    ESRD:  Per Nephrology   HTN:  BP has been labile and hampering med titration.  SBP drops during dialysis.  See below.    Chronic combined systolic and diastolic HF:  Volume management per dialysis.  Unable to remove goal of 4 liters.   No med titration at this point.   Please call with further questions.     For questions or updates, please contact [facility] Please consult [email or web address] for contact  info under Cardiology/STEMI.   Signed, Rollene Rotunda, MD  04/17/2023, 7:44 AM

## 2023-04-17 NOTE — Progress Notes (Signed)
[facility] Progress Note   Subjective: Seen, C/O itching. She is miserable.  No Chest pain    Objective Vitals:   04/17/23 0801 04/17/23 0812 04/17/23 0813 04/17/23 1050  BP: (!) 75/35 (!) 101/44 (!) 123/51 (!) 143/71  Pulse: (!) 102  (!) 101 (!) 103  Resp: (!) 21  (!) 21 (!) 24  Temp: 98.2 F (36.8 C)  98.2 F (36.8 C) 98.2 F (36.8 C)  TempSrc: Oral  Oral Oral  SpO2: 96%  94% 93%  Weight:      Height:       Physical Exam General: Elderly female in NAD Heart: S1,S2 No M/R/G Lungs: CTAB Abdomen: Soft NABs Extremities: Trace ankle edema Dialysis Access: L AVG + T/B   Additional Objective Labs: Basic Metabolic Panel: Recent Labs  Lab 04/15/23 0027 04/16/23 0657 04/17/23 0040  NA 134* 133* 133*  K 4.5 5.0 4.3  CL 93* 92* 93*  CO2 29 28 26   GLUCOSE 179* 164* 184*  BUN 52* 66* 38*  CREATININE 8.07* 9.38* 6.13*  CALCIUM 8.1* 8.3* 8.8*  PHOS  --  6.8*  --    Liver Function Tests: Recent Labs  Lab 04/16/23 0657  ALBUMIN 3.0*   No results for input(s): "LIPASE", "AMYLASE" in the last 168 hours. CBC: Recent Labs  Lab 04/13/23 1028 04/14/23 0035 04/15/23 0027 04/16/23 0905 04/17/23 0040  WBC 7.8 7.1 7.4 8.6 6.9  NEUTROABS  --  5.8  --   --   --   HGB 10.7* 9.9* 10.0* 10.1* 9.8*  HCT 37.1 34.4* 35.5* 34.6* 34.5*  MCV 104.5* 105.8* 106.9* 106.5* 104.5*  PLT 174 148* 163 149* 141*   Blood Culture    Component Value Date/Time   SDES BLOOD RIGHT HAND 11/27/2021 0929   SPECREQUEST  11/27/2021 0929    BOTTLES DRAWN AEROBIC AND ANAEROBIC Blood Culture adequate volume   CULT  11/27/2021 0929    NO GROWTH 5 DAYS Performed at [facility], [address], [place], [place] [address]    REPTSTATUS 12/02/2021 FINAL 11/27/2021 6045    Cardiac Enzymes: No results for input(s): "CKTOTAL", "CKMB", "CKMBINDEX", "TROPONINI" in the last 168 hours. CBG: Recent Labs  Lab 04/16/23 1345 04/16/23 1602 04/16/23 2036 04/17/23 0617  04/17/23 1047  GLUCAP 135* 175* 242* 139* 160*   Iron Studies: No results for input(s): "IRON", "TIBC", "TRANSFERRIN", "FERRITIN" in the last 72 hours. @lablastinr3 @ Studies/Results: ECHOCARDIOGRAM COMPLETE  Result Date: 04/15/2023    ECHOCARDIOGRAM REPORT   Patient Name:   [name] [name] Date of Exam: 04/15/2023 Medical Rec #:  [id number]         Height:       63.0 in Accession #:    [id number]        Weight:       226.6 lb Date of Birth:  13-Nov-1950          BSA:          2.039 m Patient Age:    72 years          BP:           118/50 mmHg Patient Gender: F                 HR:           113 bpm. Exam Location:  Inpatient Procedure: 2D Echo, Cardiac Doppler, Color Doppler and Intracardiac            Opacification Agent Indications:  CHF-Acute Diastolic I50.31  History:        Patient has prior history of Echocardiogram examinations, most                 recent 11/22/2021. CHF, PAD, TIA and COPD, Signs/Symptoms:Chest                 Pain and Syncope; Risk Factors:Hypertension, Sleep Apnea,                 Diabetes, Dyslipidemia and Current Smoker. ESRD.  Sonographer:    Lucendia Herrlich Referring Phys: [id number] PRATIK D [facility] IMPRESSIONS  1. Left ventricular ejection fraction, by estimation, is 35 to 40%. The left ventricle has moderately decreased function. The left ventricle demonstrates global hypokinesis. The left ventricular internal cavity size was mildly dilated. Left ventricular diastolic parameters are consistent with Grade I diastolic dysfunction (impaired relaxation).  2. Right ventricular systolic function is normal. The right ventricular size is normal. There is moderately elevated pulmonary artery systolic pressure. The estimated right ventricular systolic pressure is 47.3 mmHg.  3. Left atrial size was mildly dilated.  4. The mitral valve is normal in structure. Trivial mitral valve regurgitation. No evidence of mitral stenosis.  5. The aortic valve is tricuspid. Aortic valve regurgitation is not  visualized. No aortic stenosis is present. Aortic valve mean gradient measures 7.0 mmHg.  6. The inferior vena cava is dilated in size with <50% respiratory variability, suggesting right atrial pressure of 15 mmHg. FINDINGS  Left Ventricle: Left ventricular ejection fraction, by estimation, is 35 to 40%. The left ventricle has moderately decreased function. The left ventricle demonstrates global hypokinesis. Definity contrast agent was given IV to delineate the left ventricular endocardial borders. The left ventricular internal cavity size was mildly dilated. There is no left ventricular hypertrophy. Left ventricular diastolic parameters are consistent with Grade I diastolic dysfunction (impaired relaxation). Right Ventricle: The right ventricular size is normal. No increase in right ventricular wall thickness. Right ventricular systolic function is normal. There is moderately elevated pulmonary artery systolic pressure. The tricuspid regurgitant velocity is 2.84 m/s, and with an assumed right atrial pressure of 15 mmHg, the estimated right ventricular systolic pressure is 47.3 mmHg. Left Atrium: Left atrial size was mildly dilated. Right Atrium: Right atrial size was normal in size. Pericardium: There is no evidence of pericardial effusion. Mitral Valve: The mitral valve is normal in structure. Trivial mitral valve regurgitation. No evidence of mitral valve stenosis. Tricuspid Valve: The tricuspid valve is normal in structure. Tricuspid valve regurgitation is trivial. Aortic Valve: The aortic valve is tricuspid. Aortic valve regurgitation is not visualized. No aortic stenosis is present. Aortic valve mean gradient measures 7.0 mmHg. Aortic valve peak gradient measures 13.4 mmHg. Aortic valve area, by VTI measures 2.52  cm. Pulmonic Valve: The pulmonic valve was normal in structure. Pulmonic valve regurgitation is not visualized. Aorta: The aortic root is normal in size and structure. Venous: The inferior vena cava  is dilated in size with less than 50% respiratory variability, suggesting right atrial pressure of 15 mmHg. IAS/Shunts: No atrial level shunt detected by color flow Doppler.  LEFT VENTRICLE PLAX 2D LVIDd:         5.90 cm   Diastology LVIDs:         5.00 cm   LV e' medial:    6.64 cm/s LV PW:         1.10 cm   LV E/e' medial:  13.4 LV IVS:  0.90 cm   LV e' lateral:   8.05 cm/s LVOT diam:     2.00 cm   LV E/e' lateral: 11.1 LV SV:         81 LV SV Index:   40 LVOT Area:     3.14 cm  RIGHT VENTRICLE             IVC RV S prime:     12.10 cm/s  IVC diam: 2.30 cm TAPSE (M-mode): 2.1 cm LEFT ATRIUM           Index        RIGHT ATRIUM           Index LA diam:      4.90 cm 2.40 cm/m   RA Area:     13.50 cm LA Vol (A2C): 50.1 ml 24.57 ml/m  RA Volume:   31.40 ml  15.40 ml/m LA Vol (A4C): 78.0 ml 38.25 ml/m  AORTIC VALVE AV Area (Vmax):    2.48 cm AV Area (Vmean):   2.45 cm AV Area (VTI):     2.52 cm AV Vmax:           183.00 cm/s AV Vmean:          125.000 cm/s AV VTI:            0.322 m AV Peak Grad:      13.4 mmHg AV Mean Grad:      7.0 mmHg LVOT Vmax:         144.67 cm/s LVOT Vmean:        97.333 cm/s LVOT VTI:          0.258 m LVOT/AV VTI ratio: 0.80  AORTA Ao Root diam: 3.00 cm Ao Asc diam:  3.10 cm MITRAL VALVE                TRICUSPID VALVE MV Area (PHT): 4.49 cm     TR Peak grad:   32.3 mmHg MV Decel Time: 169 msec     TR Vmax:        284.00 cm/s MV E velocity: 89.30 cm/s MV A velocity: 108.00 cm/s  SHUNTS MV E/A ratio:  0.83         Systemic VTI:  0.26 m                             Systemic Diam: 2.00 cm Dalton McleanMD Electronically signed by Wilfred Lacy Signature Date/Time: 04/15/2023/5:22:34 PM    Final    Medications:  magnesium sulfate bolus IVPB      allopurinol  100 mg Oral Daily   aspirin  324 mg Oral Once   carvedilol  6.25 mg Oral BID WC   Chlorhexidine Gluconate Cloth  6 each Topical Q0600   clopidogrel  75 mg Oral Daily   diclofenac Sodium  2 g Topical QID   doxercalciferol  3  mcg Intravenous Q T,Th,Sa-HD   feeding supplement (NEPRO CARB STEADY)  237 mL Oral BID BM   fluticasone  1 spray Each Nare Daily   heparin  5,000 Units Subcutaneous Q8H   insulin aspart  0-6 Units Subcutaneous TID WC   levothyroxine  175 mcg Oral Q0600   lidocaine  1 patch Transdermal Q24H   mometasone-formoterol  2 puff Inhalation BID   multivitamin  1 tablet Oral QHS   pantoprazole  40 mg Oral BID AC   sevelamer carbonate  1,600 mg Oral TID  WC     Assessment/Recommendations:    ESRD -outpatient orders: [facility].  TTS.4 hours.  EDW 100.8 kg.  Flow rates: 400/600.  2K, 2 calcium.  LUE AVG.  Meds: Heparin 2000 units bolus, Venofer 50 mg once weekly, Mircera 60 mcg every 2 weeks (last dose 6/26), Hectorol 3 mcg every treatment -HD 04/18/2023 on TTS schedule   Chest pain -cardiology following -echo with ef down to 35-40% -on DAPT+statin   B/l Knee pain -per primary   Volume/ hypertension:  -EDW 100.8kg. Will UF as tolerated. HD 07/09 Net UF 3.6 L. Still very much above OP EDW. Continue to lower volume as tolerated.    Anemia of Chronic Kidney Disease -hgb 10 -transfuse prn for hgb <7 -will give a dose of venofer 50mg  today and aranesp today   Secondary Hyperparathyroidism/Hyperphosphatemia -resume home binders -hectorol with HD resumed   Chronic respiratory failure, h/o COPD -per primary. On 2L [place] at baseline   DM2 with hyperglycemia -per primary   Dispo: pending SNF    Jeremih Dearmas H. Kadra Kohan NP-C 04/17/2023, 10:55 AM  [employer] [phone]

## 2023-04-17 NOTE — Discharge Planning (Signed)
[facility] Patient Discharge Orders- [facility] CLINIC: [place]  Patient's name: [name] [name] Admit/DC Dates: 04/13/2023 - 04/17/2023  Discharge Diagnoses: Chest Pain     Aranesp: Given: Yes   Date and amount of last dose: 60 mcg SQ 04/16/2023  Last Hgb: 9.8 PRBC's Given: No Date/# of units: NA ESA dose for discharge: mircera 60 mcg IV q 2 weeks  IV Iron dose at discharge: Per protocol  Heparin change: No  EDW Change: No New EDW:   [place] Change: No  Access intervention/Change: No Details:  Hectorol/Calcitriol change: No  Discharge Labs: Calcium 8.8 Phosphorus 6.8 Albumin 3.0 K+ 4.3  IV Antibiotics: NA Details:  On Coumadin?: NA Last INR: Next INR: Managed By:   OTHER/APPTS/LAB ORDERS:    D/C Meds to be reconciled by nurse after every discharge.  Completed By:  Alonna Buckler [facility] [phone]    Reviewed by: MD:______ RN_______

## 2023-04-17 NOTE — Discharge Summary (Signed)
Physician Discharge Summary  [name] [name]:[id number] DOB: Feb 20, 1951 DOA: 04/13/2023  PCP: Vladimir Crofts, FNP  Admit date: 04/13/2023 Discharge date: 04/17/2023  Admitted From: ALF Discharge disposition: SNF   Recommendations for Outpatient Follow-Up:   HD T/Th/Sat 12:00 chair time.  Monitor blood sugar Cardiology planning for outpt cPET scan   Discharge Diagnosis:   Principal Problem:   Chest pain Active Problems:   PAD (peripheral artery disease) (HCC)   Class 2 obesity due to excess calories with body mass index (BMI) of 36.0 to 36.9 in adult   Hyperlipidemia   COPD with asthma   Hypothyroidism   OSA (obstructive sleep apnea)   DM (diabetes mellitus), type 2 with renal complications (HCC)   Anemia, unspecified   ESRD (end stage renal disease) on dialysis (HCC)   Lung cancer, lower lobe (HCC)    Discharge Condition: Improved.  Diet recommendation: renal diet  Wound care: None.  Code status: Full.   History of Present Illness:   [name] [name] is a 72 y.o. 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 Patient reports that 2 1/2 hours in the HD session, she started having substernal chest pain, ( she had some chest pressure earlier this morning), at which point the HD was stopped and she was brought to ED for further evaluation. She also reports occasional dyspnea on exertion, is on 2 lit of of [place] oxygen,. She denies any fevers or chills, nausea, vomiting or abdominal pain.  She also reports having chronic leg edema,. She reports headache and dizziness, is hungry and hasn't eaten all day.    ED work up.  She is  afebrile, BP of 95/49 mmhg, normalized to 127/58 mmhg.  Labs significant for hemoglobin of 10.7, MCV of 104.5, platelets of 174, 000.  BMP shows creatinine of 5.37, bicarb of 27, K OF 3.8.  Troponin 109, 102.    CXR  Enlargement of the cardiopericardial silhouette with pulmonary vascular congestion. Dependent interstitial edema cannot be excluded. 2. Retrocardiac opacity is likely atelectatic.   EKG shows sinus rhythm with non specific t wave abnormalities in the anterolateral leads.    She was referred to [facility] for admission for chest pain and cardiology consulted.    Hospital Course by Problem:   Chest pain/ pressure at rest , not related to activity Improved.  Troponin elevated at 107, 102.  Echocardiogram with LVEF 35-40% from 40-45% prior Cardiology planning for outpt cPET scan - plavix and statin and coreg.    H/o chronic systolic and diastolic CHF;  Fluid management as per HD.  Echo as above   ESRD on HD  Routine HD per nephrology   Asthma/ COPD/ chronic respiratory failure with hypoxic on 2 lit of [place] oxygen at home.  Squamous cell ca of the left lower lobe of the lung S/p radiation treatment  Follows up with Dr Arbutus Ped.    H/o PVD  Continue with aspirin, plavix and statin.    Hypertension:  bP parameters are optimal.       Anemia of CKD;  Hemoglobin stable around 10.7.    Insulin dependent DM: -monitor blood sugar  Hypothyroidism:  Resume synthroid.    Morbid obesity Estimated body mass index is 40.5 kg/m as calculated from the following:   Height as of this encounter: 5\' 3"  (1.6 m).   Weight as of this encounter: 103.7 kg.       Medical Consultants:   Renal cards   Discharge Exam:   Vitals:   04/17/23 0812 04/17/23 0813  BP: (!) 101/44 (!) 123/51  Pulse:  (!) 101  Resp:  (!) 21  Temp:  98.2 F (36.8 C)  SpO2:  94%   Vitals:   04/17/23 0440 04/17/23 0801 04/17/23 0812 04/17/23 0813  BP: (!) 112/53 (!) 75/35 (!) 101/44 (!) 123/51   Pulse: 96 (!) 102  (!) 101  Resp: 20 (!) 21  (!) 21  Temp: 98.1 F (36.7 C) 98.2 F (36.8 C)  98.2 F (36.8 C)  TempSrc: Oral Oral  Oral  SpO2: 96% 96%  94%  Weight:      Height:        General exam: Appears calm and comfortable.  Skin dry   The results of significant diagnostics from this hospitalization (including imaging, microbiology, ancillary and laboratory) are listed below for reference.     Procedures and Diagnostic Studies:   DG Chest Portable 1 View  Result Date: 04/13/2023 CLINICAL DATA:  Chest pain shortness of breath. EXAM: PORTABLE CHEST 1 VIEW COMPARISON:  08/08/2022 FINDINGS: The cardio pericardial silhouette is enlarged. There is pulmonary vascular congestion without overt pulmonary edema. Interstitial pulmonary edema towards the bases cannot be excluded. No focal consolidation or substantial pleural effusion evident. Retrocardiac opacity is likely atelectatic. Bones are diffusely demineralized. Telemetry leads overlie the chest. IMPRESSION: 1. Enlargement of the cardiopericardial silhouette with pulmonary vascular congestion. Dependent interstitial edema cannot be excluded. 2. Retrocardiac opacity is likely atelectatic. Electronically Signed   By: Kennith Center M.D.   On: 04/13/2023 12:31     Labs:   Basic Metabolic Panel: Recent Labs  Lab 04/13/23 1028 04/14/23 0035 04/15/23 0027 04/16/23 0657 04/17/23 0040  NA 135 134* 134* 133* 133*  K 3.8 4.2 4.5 5.0 4.3  CL 92* 97* 93* 92* 93*  CO2 27 26 29 28 26   GLUCOSE 166* 241* 179* 164* 184*  BUN 35* 39* 52* 66* 38*  CREATININE 5.37* 6.38* 8.07* 9.38* 6.13*  CALCIUM 8.1* 8.3* 8.1* 8.3* 8.8*  MG  --   --  1.9  --  1.8  PHOS  --   --   --  6.8*  --    GFR Estimated Creatinine Clearance: 9.5 mL/min (A) (by C-G formula based on SCr of 6.13 mg/dL (H)). Liver Function Tests: Recent Labs  Lab 04/16/23 0657  ALBUMIN 3.0*   No results for input(s): "LIPASE", "AMYLASE" in the last 168 hours. No results for  input(s): "AMMONIA" in the last 168 hours. Coagulation profile No results for input(s): "INR", "PROTIME" in the last 168 hours.  CBC: Recent Labs  Lab 04/13/23 1028 04/14/23 0035 04/15/23 0027 04/16/23 0905 04/17/23 0040  WBC 7.8 7.1 7.4 8.6 6.9  NEUTROABS  --  5.8  --   --   --   HGB 10.7* 9.9* 10.0* 10.1* 9.8*  HCT 37.1 34.4* 35.5* 34.6* 34.5*  MCV 104.5* 105.8* 106.9* 106.5* 104.5*  PLT 174 148* 163 149* 141*   Cardiac Enzymes: No results for input(s): "CKTOTAL", "CKMB", "CKMBINDEX", "TROPONINI" in the last 168 hours. BNP: Invalid input(s): "POCBNP" CBG: Recent Labs  Lab 04/16/23 0606 04/16/23 1345 04/16/23 1602 04/16/23 2036  04/17/23 0617  GLUCAP 136* 135* 175* 242* 139*   D-Dimer No results for input(s): "DDIMER" in the last 72 hours. Hgb A1c No results for input(s): "HGBA1C" in the last 72 hours. Lipid Profile No results for input(s): "CHOL", "HDL", "LDLCALC", "TRIG", "CHOLHDL", "LDLDIRECT" in the last 72 hours. Thyroid function studies No results for input(s): "TSH", "T4TOTAL", "T3FREE", "THYROIDAB" in the last 72 hours.  Invalid input(s): "FREET3" Anemia work up No results for input(s): "VITAMINB12", "FOLATE", "FERRITIN", "TIBC", "IRON", "RETICCTPCT" in the last 72 hours. Microbiology No results found for this or any previous visit (from the past 240 hour(s)).   Discharge Instructions:   Discharge Instructions     Discharge instructions   Complete by: As directed    Renal diet   Increase activity slowly   Complete by: As directed    No wound care   Complete by: As directed       Allergies as of 04/17/2023       Reactions   Nebivolol Swelling   Chest pain (reaction to Bystolic)   Zestril [lisinopril] Swelling, Other (See Comments)   Ace Inhibitors Swelling, Other (See Comments)   Tongue swell   Morphine And Codeine Itching        Medication List     STOP taking these medications    cyclobenzaprine 5 MG tablet Commonly known as:  FLEXERIL   Insulin Lispro Prot & Lispro (75-25) 100 UNIT/ML Kwikpen Commonly known as: HUMALOG 75/25 MIX       TAKE these medications    acetaminophen 500 MG tablet Commonly known as: TYLENOL Take 1,000 mg by mouth 2 (two) times daily.   albuterol (2.5 MG/3ML) 0.083% nebulizer solution Commonly known as: PROVENTIL Take 3 mLs (2.5 mg total) by nebulization every 6 (six) hours as needed for wheezing or shortness of breath.   albuterol 108 (90 Base) MCG/ACT inhaler Commonly known as: VENTOLIN HFA Inhale 2 puffs into the lungs every 6 (six) hours as needed for shortness of breath (COPD).   allopurinol 100 MG tablet Commonly known as: ZYLOPRIM Take 1 tablet (100 mg total) by mouth daily.   budesonide-formoterol 160-4.5 MCG/ACT inhaler Commonly known as: SYMBICORT Inhale 2 puffs into the lungs in the morning and at bedtime.   carboxymethylcellulose 0.5 % Soln Commonly known as: REFRESH PLUS Place 1 drop into both eyes 3 (three) times daily as needed (dry/irritated eyes.).   carvedilol 6.25 MG tablet Commonly known as: COREG Take 1 tablet (6.25 mg total) by mouth 2 (two) times daily with a meal. What changed:  medication strength how much to take when to take this   cetirizine 10 MG tablet Commonly known as: ZYRTEC Take 10 mg by mouth 2 (two) times daily.   clopidogrel 75 MG tablet Commonly known as: PLAVIX Take 75 mg by mouth daily.   diclofenac Sodium 1 % Gel Commonly known as: VOLTAREN Apply 2 g topically 4 (four) times daily.   Docusate Sodium 100 MG capsule Take 100 mg by mouth daily.   feeding supplement (NEPRO CARB STEADY) Liqd Take 237 mLs by mouth 2 (two) times daily between meals.   fluticasone 50 MCG/ACT nasal spray Commonly known as: FLONASE Place 1 spray into both nostrils daily.   insulin aspart 100 UNIT/ML injection Commonly known as: novoLOG Inject 0-6 Units into the skin 3 (three) times daily with meals.   ipratropium 0.06 % nasal  spray Commonly known as: ATROVENT Place 2 sprays into both nostrils 4 (four) times daily.   levothyroxine 175  MCG tablet Commonly known as: SYNTHROID Take 175 mcg by mouth daily.   lidocaine 5 % Commonly known as: LIDODERM Place 2 patches onto the skin daily. 1 patch to each side of lower back   lidocaine-prilocaine cream Commonly known as: EMLA Apply 1 Application topically See admin instructions. On dialysis days.   multivitamins with iron Tabs tablet Take 1 tablet by mouth in the morning.   pantoprazole 40 MG tablet Commonly known as: PROTONIX Take 1 tablet (40 mg total) by mouth 2 (two) times daily before a meal.   sevelamer carbonate 800 MG tablet Commonly known as: RENVELA Take 2 tablets (1,600 mg total) by mouth 3 (three) times daily with meals.   thiamine 100 MG tablet Commonly known as: Vitamin B-1 Take 100 mg by mouth in the morning.        Follow-up Information     Jodelle Gross, NP Follow up on 04/23/2023.   Specialties: Cardiology, Radiology, Cardiology Why: at 8:25am for your cardiology follow up appointment Contact information: [address] [place] [address] [phone]                  Time coordinating discharge: 45 min  Signed:  Joseph Art DO  [facility] 04/17/2023, 9:13 AM

## 2023-04-17 NOTE — TOC Transition Note (Signed)
Transition of Care [facility]) - CM/SW Discharge Note   Patient Details  Name: [name] [name] MRN: [id number] Date of Birth: [birth year]  Transition of Care [facility]) CM/SW Contact:  Anadalay Macdonell A [place], LCSWA Phone Number: 04/17/2023, 2:44 PM   Clinical Narrative:     Patient will DC to: [facility]  Anticipated DC date: 04/17/23  Family notified: Ray Phifer  Transport by: 04/17/23      Per MD patient ready for DC to [facility]. RN, patient, patient's family, and facility notified of DC. Discharge Summary and FL2 sent to facility. RN to call report prior to discharge (808p, [phone]. ). DC packet on chart. Ambulance transport requested for patient.     CSW will sign off for now as social work intervention is no longer needed. Please consult [name] again if new needs arise.   Final next level of care: Skilled Nursing Facility Barriers to Discharge: Barriers Resolved   Patient Goals and CMS Choice      Discharge Placement                Patient chooses bed at: [facility] Patient to be transferred to facility by: [facility] Name of family member notified: Ray Phifer Patient and family notified of of transfer: 04/17/23  Discharge Plan and Services Additional resources added to the After Visit Summary for                                       Social Determinants of Health (SDOH) Interventions SDOH Screenings   Food Insecurity: No Food Insecurity (04/13/2023)  Housing: Medium Risk (04/13/2023)  Transportation Needs: No Transportation Needs (04/13/2023)  Utilities: Not At Risk (04/13/2023)  Tobacco Use: Medium Risk (04/13/2023)     Readmission Risk Interventions     No data to display

## 2023-04-17 NOTE — Progress Notes (Addendum)
Attempted to give report to nurse at [facility]. Unsuccessful. Will attempt at a later time.   Update 1558: attempted to call again but unsuccessful. CSW updated and made aware report has not been provided.

## 2023-04-23 ENCOUNTER — Ambulatory Visit: Payer: 59 | Admitting: Adult Health

## 2023-04-23 NOTE — Progress Notes (Signed)
  Cardiology Office Note   Date:  04/24/2023  ID:  [name], [name] 10/01/1951, MRN [id number] PCP:  Vladimir Crofts, FNP [facility] Cardiologist: Nanetta Batty, MD  Reason for visit: Hospital follow-up  History of Present Illness    [name] [name] is a 72 y.o. female with a hx of TIAs, tobacco use - now quit, hypertension, diabetes, hyperlipidemia, PAD followed by Dr. Darrick Penna, chronic systolic and diastolic heart failure, COPD, lung cancer s/p curative radiation 2023, ESRD on HD since 06/2022 followed by [facility], hypothyroidism, morbid obesity and left bundle branch block.  Reportedly she had normal coronary arteries on cardiac catheterization 06/2014.   She was last seen in outpatient cardiology in June 2021 where she was seen by the heart failure clinic.  Patient was in the hospital in 2020 and 2021 for decompensated heart failure.  At her appointment in June 2021, patient voiced she is frustrated with her care and wanted to transition back to Dr. Allyson Sabal.  She had refused to take some of her medications, her CKD was advancing close to needing HD.  She is here for hospital follow-up.  She was admitted July 6 to April 17, 2023 for chest pain during HD session.  Today, patient states she is currently at rehab.  She describes intermittent chest pressure, feels like something is stepping on her chest -located in the central chest.  No particular trigger.  Occurring for the last several months, may be getting worse.  Chronic shortness of breath.  Patient has chronic lower extremity edema, currently wearing compression stockings.  Patient states she needs to cut visit short today as she has constipation and was given several laxatives this morning.   Objective / Physical Exam   EKG today: Normal sinus rhythm with PVC.  Vital signs:  BP 113/62 (BP Location: Right Arm, Patient Position: Sitting, Cuff Size: Large)   Pulse 93   Ht 5\' 3"  (1.6 m)   Wt 224 lb (101.6 kg)   BMI  39.68 kg/m     GEN: No acute distress, wearing O2 NECK: No carotid bruits CARDIAC: RRR, no murmurs RESPIRATORY:  Clear to auscultation without rales, wheezing or rhonchi  EXTREMITIES: 1+ LE edema, R>L, wearing compression stockings  Assessment and Plan    Precordial pain -Admitted with CP 04/2023 -With many cardiac risk factors including history of TIA, tobacco use, diabetes, hyperlipidemia, PAD and obesity - will plan cardiac PET to evaluate for ischemia -Continue beta-blocker therapy. -Patient is on Plavix 75 mg daily with history TIAs and PAD.  Chronic systolic and diastolic heart failure -Echo 04/2023 with EF 35-40% down from 40-45%, G1DD  -Volume management per HD -Patient with allergy to ACE inhibitors (tongue swelling) -Intolerant to BiDil due to dizziness, headache, visual change; had -Refused amlodipine in the past  -Continue carvedilol 6.25 mg twice daily. -Management limited by blood pressure.  Asthma/ COPD/ chronic respiratory failure with hypoxic on 2 lit of [place] oxygen at home Squamous cell ca of the left lower lobe of the lung S/p curativeradiation treatment  Follows up with Dr Arbutus Ped.   Hypertension, well-controlled -Continue current medications. -Goal BP is <130/80.  Recommend DASH diet (high in vegetables, fruits, low-fat dairy products, whole grains, poultry, fish, and nuts and low in sweets, sugar-sweetened beverages, and red meats), salt restriction and increase physical activity.  Disposition - Follow-up in 3 months.   Signed, Cannon Kettle, PA-C  04/24/2023 [facility]

## 2023-04-24 ENCOUNTER — Encounter: Payer: Self-pay | Admitting: Physician Assistant

## 2023-04-24 ENCOUNTER — Ambulatory Visit: Payer: No Typology Code available for payment source | Attending: Adult Health | Admitting: Physician Assistant

## 2023-04-24 VITALS — BP 113/62 | HR 93 | Ht 63.0 in | Wt 224.0 lb

## 2023-04-24 DIAGNOSIS — I5042 Chronic combined systolic (congestive) and diastolic (congestive) heart failure: Secondary | ICD-10-CM | POA: Diagnosis not present

## 2023-04-24 DIAGNOSIS — E785 Hyperlipidemia, unspecified: Secondary | ICD-10-CM

## 2023-04-24 DIAGNOSIS — I10 Essential (primary) hypertension: Secondary | ICD-10-CM | POA: Diagnosis not present

## 2023-04-24 DIAGNOSIS — R079 Chest pain, unspecified: Secondary | ICD-10-CM | POA: Diagnosis not present

## 2023-04-24 IMAGING — DX DG CHEST 1V PORT
1 series · 1 of 1 positions shown · non-contrast
Comparison: Single-view of the chest 01/05/2021 and 01/06/2020.

CLINICAL DATA: Status post vomiting x2 today.

EXAM:
PORTABLE CHEST 1 VIEW

[chest ap]
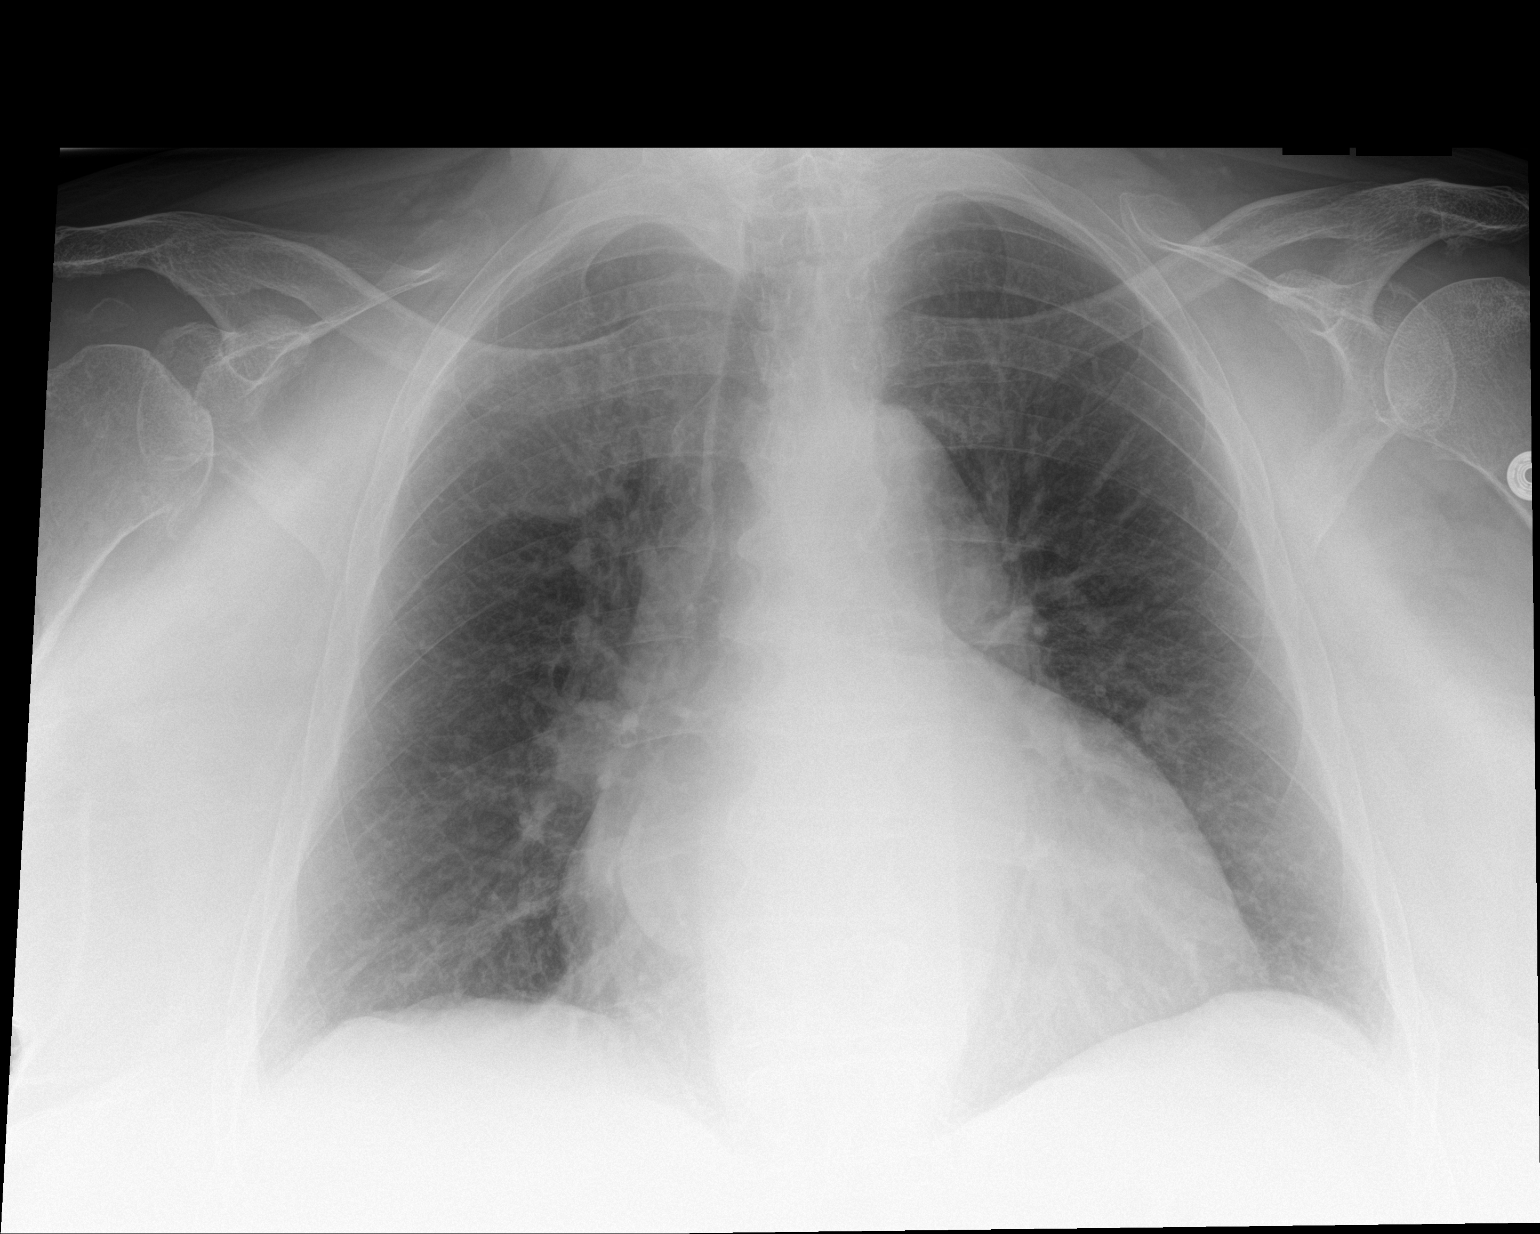

[1 of 1 positions shown; findings below may reference images not displayed]

FINDINGS: There is cardiomegaly without edema. Lungs clear. No pneumothorax or
pleural fluid. No acute or focal bony abnormality.
IMPRESSION: No acute disease.

Cardiomegaly.

## 2023-04-24 NOTE — Patient Instructions (Signed)
Medication Instructions:  No Changes *If you need a refill on your cardiac medications before your next appointment, please call your pharmacy*   Lab Work: No Labs If you have labs (blood work) drawn today and your tests are completely normal, you will receive your results only by: MyChart Message (if you have MyChart) OR A paper copy in the mail If you have any lab test that is abnormal or we need to change your treatment, we will call you to review the results.   Testing/Procedures: How to Prepare for Your Cardiac PET/CT Stress Test:  1. Please do not take these medications before your test:   Medications that may interfere with the cardiac pharmacological stress agent (ex. nitrates - including erectile dysfunction medications, isosorbide mononitrate, tamulosin or beta-blockers) the day of the exam. (Erectile dysfunction medication should be held for at least 72 hrs prior to test) Theophylline containing medications for 12 hours. Dipyridamole 48 hours prior to the test. Your remaining medications may be taken with water.  2. Nothing to eat or drink, except water, 3 hours prior to arrival time.   NO caffeine/decaffeinated products, or chocolate 12 hours prior to arrival.  3. NO perfume, cologne or lotion  4. Total time is 1 to 2 hours; you may want to bring reading material for the waiting time.  5. Please report to Radiology at the [facility] Main Entrance 30 minutes early for your test.  [address] [place], [place] [address]  6. Please report to Radiology at [facility] Main Entrance, medical mall, 30 mins prior to your test.  [address]  [place], [place]  [phone]  Diabetic Preparation:  Hold oral medications. You may take NPH and Lantus insulin. Do not take Humalog or Humulin R (Regular Insulin) the day of your test. Check blood sugars prior to leaving the house. If able to eat breakfast prior to 3 hour fasting, you  may take all medications, including your insulin, Do not worry if you miss your breakfast dose of insulin - start at your next meal.  IF YOU THINK YOU MAY BE PREGNANT, OR ARE NURSING PLEASE INFORM THE TECHNOLOGIST.  In preparation for your appointment, medication and supplies will be purchased.  Appointment availability is limited, so if you need to cancel or reschedule, please call the Radiology Department at [phone] [facility]) [place] [phone] [facility])  24 hours in advance to avoid a cancellation fee of $100.00  What to Expect After you Arrive:  Once you arrive and check in for your appointment, you will be taken to a preparation room within the Radiology Department.  A technologist or Nurse will obtain your medical history, verify that you are correctly prepped for the exam, and explain the procedure.  Afterwards,  an IV will be started in your arm and electrodes will be placed on your skin for EKG monitoring during the stress portion of the exam. Then you will be escorted to the PET/CT scanner.  There, staff will get you positioned on the scanner and obtain a blood pressure and EKG.  During the exam, you will continue to be connected to the EKG and blood pressure machines.  A small, safe amount of a radioactive tracer will be injected in your IV to obtain a series of pictures of your heart along with an injection of a stress agent.    After your Exam:  It is recommended that you eat a meal and drink a caffeinated beverage to counter  act any effects of the stress agent.  Drink plenty of fluids for the remainder of the day and urinate frequently for the first couple of hours after the exam.  Your doctor will inform you of your test results within 7-10 business days.  For more information and frequently asked questions, please visit our website : [email or web address]  For questions about your test or how to prepare for your test, please call: Rockwell Alexandria, Cardiac Imaging Nurse  Navigator  Larey Brick, Cardiac Imaging Nurse Navigator Johney Frame, Cardiac Imaging Nurse Navigator Office: [phone]    Follow-Up: At [facility], you and your health needs are our priority.  As part of our continuing mission to provide you with exceptional heart care, we have created designated Provider Care Teams.  These Care Teams include your primary Cardiologist (physician) and Advanced Practice Providers (APPs -  Physician Assistants and Nurse Practitioners) who all work together to provide you with the care you need, when you need it.  We recommend signing up for the patient portal called "MyChart".  Sign up information is provided on this After Visit Summary.  MyChart is used to connect with patients for Virtual Visits (Telemedicine).  Patients are able to view lab/test results, encounter notes, upcoming appointments, etc.  Non-urgent messages can be sent to your provider as well.   To learn more about what you can do with MyChart, go to [email or web address].    Your next appointment:   3 month(s)  Provider:   Nanetta Batty, MD

## 2023-05-06 ENCOUNTER — Emergency Department (HOSPITAL_COMMUNITY): Payer: 59

## 2023-05-06 ENCOUNTER — Inpatient Hospital Stay (HOSPITAL_COMMUNITY)
Admission: EM | Admit: 2023-05-06 | Discharge: 2023-05-14 | DRG: 189 | Disposition: A | Discharge status: Home Health | Payer: 59 | Source: Skilled Nursing Facility | Attending: Internal Medicine | Admitting: Internal Medicine

## 2023-05-06 ENCOUNTER — Other Ambulatory Visit: Payer: Self-pay

## 2023-05-06 ENCOUNTER — Encounter (HOSPITAL_COMMUNITY): Payer: Self-pay

## 2023-05-06 DIAGNOSIS — T380X5A Adverse effect of glucocorticoids and synthetic analogues, initial encounter: Secondary | ICD-10-CM | POA: Diagnosis present

## 2023-05-06 DIAGNOSIS — I132 Hypertensive heart and chronic kidney disease with heart failure and with stage 5 chronic kidney disease, or end stage renal disease: Secondary | ICD-10-CM | POA: Diagnosis present

## 2023-05-06 DIAGNOSIS — E1151 Type 2 diabetes mellitus with diabetic peripheral angiopathy without gangrene: Secondary | ICD-10-CM | POA: Diagnosis present

## 2023-05-06 DIAGNOSIS — E119 Type 2 diabetes mellitus without complications: Secondary | ICD-10-CM

## 2023-05-06 DIAGNOSIS — Z794 Long term (current) use of insulin: Secondary | ICD-10-CM

## 2023-05-06 DIAGNOSIS — Z888 Allergy status to other drugs, medicaments and biological substances status: Secondary | ICD-10-CM

## 2023-05-06 DIAGNOSIS — Z6839 Body mass index (BMI) 39.0-39.9, adult: Secondary | ICD-10-CM

## 2023-05-06 DIAGNOSIS — E890 Postprocedural hypothyroidism: Secondary | ICD-10-CM | POA: Diagnosis present

## 2023-05-06 DIAGNOSIS — R0602 Shortness of breath: Secondary | ICD-10-CM | POA: Diagnosis not present

## 2023-05-06 DIAGNOSIS — Z8249 Family history of ischemic heart disease and other diseases of the circulatory system: Secondary | ICD-10-CM

## 2023-05-06 DIAGNOSIS — Z8673 Personal history of transient ischemic attack (TIA), and cerebral infarction without residual deficits: Secondary | ICD-10-CM

## 2023-05-06 DIAGNOSIS — J9621 Acute and chronic respiratory failure with hypoxia: Principal | ICD-10-CM | POA: Diagnosis present

## 2023-05-06 DIAGNOSIS — I5043 Acute on chronic combined systolic (congestive) and diastolic (congestive) heart failure: Secondary | ICD-10-CM | POA: Diagnosis present

## 2023-05-06 DIAGNOSIS — Z85118 Personal history of other malignant neoplasm of bronchus and lung: Secondary | ICD-10-CM

## 2023-05-06 DIAGNOSIS — E1129 Type 2 diabetes mellitus with other diabetic kidney complication: Secondary | ICD-10-CM | POA: Diagnosis present

## 2023-05-06 DIAGNOSIS — Z9981 Dependence on supplemental oxygen: Secondary | ICD-10-CM

## 2023-05-06 DIAGNOSIS — Z8614 Personal history of Methicillin resistant Staphylococcus aureus infection: Secondary | ICD-10-CM

## 2023-05-06 DIAGNOSIS — Z79890 Hormone replacement therapy (postmenopausal): Secondary | ICD-10-CM

## 2023-05-06 DIAGNOSIS — Z79899 Other long term (current) drug therapy: Secondary | ICD-10-CM

## 2023-05-06 DIAGNOSIS — Z7902 Long term (current) use of antithrombotics/antiplatelets: Secondary | ICD-10-CM

## 2023-05-06 DIAGNOSIS — J441 Chronic obstructive pulmonary disease with (acute) exacerbation: Secondary | ICD-10-CM | POA: Diagnosis present

## 2023-05-06 DIAGNOSIS — Z993 Dependence on wheelchair: Secondary | ICD-10-CM

## 2023-05-06 DIAGNOSIS — K5900 Constipation, unspecified: Secondary | ICD-10-CM | POA: Diagnosis not present

## 2023-05-06 DIAGNOSIS — J9611 Chronic respiratory failure with hypoxia: Secondary | ICD-10-CM

## 2023-05-06 DIAGNOSIS — Z885 Allergy status to narcotic agent status: Secondary | ICD-10-CM

## 2023-05-06 DIAGNOSIS — Z87891 Personal history of nicotine dependence: Secondary | ICD-10-CM

## 2023-05-06 DIAGNOSIS — C3430 Malignant neoplasm of lower lobe, unspecified bronchus or lung: Secondary | ICD-10-CM | POA: Diagnosis present

## 2023-05-06 DIAGNOSIS — E1165 Type 2 diabetes mellitus with hyperglycemia: Secondary | ICD-10-CM | POA: Diagnosis present

## 2023-05-06 DIAGNOSIS — N186 End stage renal disease: Secondary | ICD-10-CM

## 2023-05-06 DIAGNOSIS — Z923 Personal history of irradiation: Secondary | ICD-10-CM

## 2023-05-06 DIAGNOSIS — R0902 Hypoxemia: Principal | ICD-10-CM

## 2023-05-06 DIAGNOSIS — E875 Hyperkalemia: Secondary | ICD-10-CM | POA: Diagnosis present

## 2023-05-06 DIAGNOSIS — G894 Chronic pain syndrome: Secondary | ICD-10-CM | POA: Diagnosis present

## 2023-05-06 DIAGNOSIS — R0789 Other chest pain: Secondary | ICD-10-CM

## 2023-05-06 DIAGNOSIS — J9622 Acute and chronic respiratory failure with hypercapnia: Secondary | ICD-10-CM | POA: Diagnosis present

## 2023-05-06 DIAGNOSIS — Z7401 Bed confinement status: Secondary | ICD-10-CM

## 2023-05-06 DIAGNOSIS — Z90710 Acquired absence of both cervix and uterus: Secondary | ICD-10-CM

## 2023-05-06 DIAGNOSIS — Z7951 Long term (current) use of inhaled steroids: Secondary | ICD-10-CM

## 2023-05-06 DIAGNOSIS — E785 Hyperlipidemia, unspecified: Secondary | ICD-10-CM | POA: Diagnosis present

## 2023-05-06 DIAGNOSIS — D631 Anemia in chronic kidney disease: Secondary | ICD-10-CM | POA: Diagnosis present

## 2023-05-06 DIAGNOSIS — Z96649 Presence of unspecified artificial hip joint: Secondary | ICD-10-CM | POA: Diagnosis present

## 2023-05-06 DIAGNOSIS — N250 Renal osteodystrophy: Secondary | ICD-10-CM | POA: Diagnosis present

## 2023-05-06 DIAGNOSIS — I10 Essential (primary) hypertension: Secondary | ICD-10-CM | POA: Diagnosis present

## 2023-05-06 DIAGNOSIS — Z833 Family history of diabetes mellitus: Secondary | ICD-10-CM

## 2023-05-06 DIAGNOSIS — Z992 Dependence on renal dialysis: Secondary | ICD-10-CM

## 2023-05-06 DIAGNOSIS — E1122 Type 2 diabetes mellitus with diabetic chronic kidney disease: Secondary | ICD-10-CM | POA: Diagnosis present

## 2023-05-06 DIAGNOSIS — Z1152 Encounter for screening for COVID-19: Secondary | ICD-10-CM

## 2023-05-06 DIAGNOSIS — G4733 Obstructive sleep apnea (adult) (pediatric): Secondary | ICD-10-CM | POA: Diagnosis present

## 2023-05-06 LAB — CBC WITH DIFFERENTIAL/PLATELET
Abs Immature Granulocytes: 0.09 10*3/uL — ABNORMAL HIGH (ref 0.00–0.07)
Basophils Absolute: 0 10*3/uL (ref 0.0–0.1)
Basophils Relative: 0 %
Eosinophils Absolute: 0 10*3/uL (ref 0.0–0.5)
Eosinophils Relative: 0 %
HCT: 35.7 % — ABNORMAL LOW (ref 36.0–46.0)
Hemoglobin: 10.2 g/dL — ABNORMAL LOW (ref 12.0–15.0)
Immature Granulocytes: 1 %
Lymphocytes Relative: 8 %
Lymphs Abs: 0.8 10*3/uL (ref 0.7–4.0)
MCH: 30.3 pg (ref 26.0–34.0)
MCHC: 28.6 g/dL — ABNORMAL LOW (ref 30.0–36.0)
MCV: 105.9 fL — ABNORMAL HIGH (ref 80.0–100.0)
Monocytes Absolute: 0.8 10*3/uL (ref 0.1–1.0)
Monocytes Relative: 8 %
Neutro Abs: 8.6 10*3/uL — ABNORMAL HIGH (ref 1.7–7.7)
Neutrophils Relative %: 83 %
Platelets: 208 10*3/uL (ref 150–400)
RBC: 3.37 MIL/uL — ABNORMAL LOW (ref 3.87–5.11)
RDW: 15.1 % (ref 11.5–15.5)
WBC: 10.3 10*3/uL (ref 4.0–10.5)
nRBC: 0 % (ref 0.0–0.2)

## 2023-05-06 LAB — BRAIN NATRIURETIC PEPTIDE: B Natriuretic Peptide: 617.7 pg/mL — ABNORMAL HIGH (ref 0.0–100.0)

## 2023-05-06 LAB — TROPONIN I (HIGH SENSITIVITY): Troponin I (High Sensitivity): 91 ng/L — ABNORMAL HIGH (ref <18)

## 2023-05-06 LAB — COMPREHENSIVE METABOLIC PANEL
ALT: 14 U/L (ref 0–44)
AST: 23 U/L (ref 15–41)
Albumin: 3.3 g/dL — ABNORMAL LOW (ref 3.5–5.0)
Alkaline Phosphatase: 126 U/L (ref 38–126)
Anion gap: 16 — ABNORMAL HIGH (ref 5–15)
BUN: 62 mg/dL — ABNORMAL HIGH (ref 8–23)
CO2: 31 mmol/L (ref 22–32)
Calcium: 9.5 mg/dL (ref 8.9–10.3)
Chloride: 86 mmol/L — ABNORMAL LOW (ref 98–111)
Creatinine, Ser: 7.71 mg/dL — ABNORMAL HIGH (ref 0.44–1.00)
GFR, Estimated: 5 mL/min — ABNORMAL LOW (ref >60)
Glucose, Bld: 171 mg/dL — ABNORMAL HIGH (ref 70–99)
Potassium: 5.3 mmol/L — ABNORMAL HIGH (ref 3.5–5.1)
Sodium: 133 mmol/L — ABNORMAL LOW (ref 135–145)
Total Bilirubin: 0.6 mg/dL (ref 0.3–1.2)
Total Protein: 6.8 g/dL (ref 6.5–8.1)

## 2023-05-06 LAB — SARS CORONAVIRUS 2 BY RT PCR: SARS Coronavirus 2 by RT PCR: NEGATIVE

## 2023-05-06 MED ORDER — IOHEXOL 350 MG/ML SOLN
75.0000 mL | Freq: Once | INTRAVENOUS | Status: AC | PRN
  Administered 2023-05-06: 75 mL via INTRAVENOUS

## 2023-05-06 MED ORDER — SODIUM ZIRCONIUM CYCLOSILICATE 10 G PO PACK
10.0000 g | PACK | Freq: Once | ORAL | Status: AC
  Administered 2023-05-07: 10 g via ORAL
  Filled 2023-05-06: qty 1

## 2023-05-06 NOTE — ED Notes (Signed)
[name] [name] (Husband) [phone]

## 2023-05-06 NOTE — Discharge Instructions (Signed)
Please advice patient to make sure to have dialysis with in 24 hours of receiving ct scan

## 2023-05-06 NOTE — ED Notes (Signed)
I was advised by secretary that IV team nurse notified her that she had obtained and sent labs after starting USIV. Labs had not yet been sent, were labelled and on table in patient's room. I sent them once I realized they had not been sent/run.

## 2023-05-06 NOTE — ED Triage Notes (Signed)
Patient BIB EMS from [facility], c/o chest pressure and SOB since 1500. LLB on ECG. 3liters via [place] at home. 87% on 3 liters. NRB on 15liters @ 100%. End tidal 23, CO @ 20. 150/90 H/O ESRD (t/th/sat)

## 2023-05-06 NOTE — ED Provider Notes (Signed)
[place] EMERGENCY DEPARTMENT AT [facility] Provider Note   CSN: [id number] Arrival date & time: 05/06/23  1828     History  Chief Complaint  Patient presents with   Shortness of Breath   Chest Pain    [name] [name] is a 72 y.o. female.   Shortness of Breath Associated symptoms: chest pain   Chest Pain Associated symptoms: shortness of breath      72 y.o. 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  The patient is normally on 3 L O2 via nasal cannula at home but was hypoxic to 87% on her baseline 3 L and had to be escalated to a nonrebreather at 15 L/min. On arrival, the patient had no complaints and was de-escalated back to her home 3L O2 via nasal cannula. She arrived GCS 15, AAOx3, ABC intact.  Home Medications Prior to Admission medications   Medication Sig Start Date End Date Taking? Authorizing Provider  acetaminophen (TYLENOL) 500 MG tablet Take 1,000 mg by mouth 2 (two) times daily.    [provider]  albuterol (PROVENTIL) (2.5 MG/3ML) 0.083% nebulizer solution Take 3 mLs (2.5 mg total) by nebulization every 6 (six) hours as needed for wheezing or shortness of breath. 12/08/21   Demaio, Alexa, MD  albuterol (VENTOLIN HFA) 108 (90 Base) MCG/ACT inhaler Inhale 2 puffs into the lungs every 6 (six) hours as needed for shortness of breath (COPD). 12/29/21   [provider]  allopurinol (ZYLOPRIM) 100 MG tablet Take 1 tablet (100 mg total) by mouth daily. 03/05/21   Medina-Vargas, Monina C, NP  budesonide-formoterol (SYMBICORT) 160-4.5 MCG/ACT inhaler Inhale 2 puffs into the lungs in the morning and at bedtime.     [provider]  carboxymethylcellulose (REFRESH PLUS) 0.5 % SOLN Place 1 drop into both eyes 3 (three) times daily as needed (dry/irritated eyes.).    [provider]  carvedilol (COREG) 6.25 MG tablet Take 1 tablet (6.25 mg total) by mouth 2 (two) times daily with a meal. 04/17/23   Joseph Art, DO  cetirizine (ZYRTEC) 10 MG tablet Take 10 mg by mouth 2 (two) times daily. 04/17/22   [provider]  clopidogrel (PLAVIX) 75 MG tablet Take 75 mg by mouth daily.    [provider]  diclofenac Sodium (VOLTAREN) 1 % GEL Apply 2 g topically 4 (four) times daily. Patient not taking: Reported on 04/24/2023 04/17/23   Joseph Art, DO  Docusate Sodium 100 MG capsule Take 100 mg by mouth daily. Patient not taking: Reported on 04/24/2023 03/26/23   [provider]  fluticasone (FLONASE) 50 MCG/ACT nasal spray Place 1 spray into both nostrils daily. 06/12/22   [provider]  insulin aspart (NOVOLOG) 100 UNIT/ML injection Inject 0-6 Units into the skin 3 (three) times daily with meals. 04/17/23   Marlin Canary U, DO  ipratropium (ATROVENT) 0.06 % nasal spray Place 2 sprays into both nostrils 4 (four) times daily. 11/23/22 05/05/23  [provider]  levothyroxine (SYNTHROID) 175 MCG tablet Take 175 mcg by mouth daily.    [provider]  lidocaine (LIDODERM) 5 % Place 2 patches onto the skin daily. 1  patch to each side of lower back 12/14/21   [provider]  lidocaine-prilocaine (EMLA) cream Apply 1 Application topically See admin instructions. On dialysis days. 02/13/22   [provider]  Multiple Vitamins-Iron (MULTIVITAMINS WITH IRON) TABS tablet Take 1 tablet by mouth in the morning.    [provider]  Nutritional Supplements (FEEDING SUPPLEMENT, NEPRO CARB STEADY,) LIQD Take 237 mLs by mouth 2 (two) times daily between meals. 04/17/23   Joseph Art, DO  OXYGEN Inhale into the lungs.    [provider]  pantoprazole (PROTONIX) 40 MG tablet Take 1 tablet (40 mg total) by mouth 2 (two) times daily before a meal. 07/10/21   Amin, Loura Halt, MD  sevelamer carbonate (RENVELA) 800 MG tablet Take 2 tablets (1,600 mg total) by mouth 3 (three) times daily with meals. 04/17/23   Joseph Art, DO  thiamine (VITAMIN B-1) 100 MG tablet Take 100 mg by mouth in the morning.    [provider]      Allergies    Nebivolol, Zestril [lisinopril], Ace inhibitors, and Morphine and codeine    Review of Systems   Review of Systems  Respiratory:  Positive for shortness of breath.   Cardiovascular:  Positive for chest pain.  All other systems reviewed and are negative.   Physical Exam Updated Vital Signs BP 129/67   Pulse (!) 103   Temp 98.6 F (37 C) (Oral)   Resp 15   Ht 5\' 3"  (1.6 m)   Wt 99.8 kg   SpO2 96%   BMI 38.97 kg/m  Physical Exam Vitals and nursing note reviewed.  Constitutional:      General: She is not in acute distress.    Appearance: She is well-developed. She is obese.  HENT:     Head: Normocephalic and atraumatic.  Eyes:     Conjunctiva/sclera: Conjunctivae normal.  Cardiovascular:     Rate and Rhythm: Normal rate and regular rhythm.     Heart sounds: No murmur heard. Pulmonary:     Effort: Pulmonary effort is normal. No respiratory distress.     Breath sounds: Examination of the right-upper field reveals decreased breath sounds. Examination of the left-upper field reveals decreased breath sounds. Examination of the right-middle field reveals decreased breath sounds. Examination of the left-middle field reveals decreased breath sounds. Examination of the right-lower field reveals decreased breath sounds. Examination of the left-lower field reveals decreased breath sounds. Decreased breath sounds present.  Abdominal:     Palpations: Abdomen is soft.     Tenderness: There is no abdominal tenderness.  Musculoskeletal:        General: No swelling.     Cervical  back: Neck supple.  Skin:    General: Skin is warm and dry.     Capillary Refill: Capillary refill takes less than 2 seconds.  Neurological:     Mental Status: She is alert.  Psychiatric:        Mood and Affect: Mood normal.     ED Results / Procedures / Treatments   Labs (all labs ordered are listed, but only abnormal results are displayed) Labs Reviewed  CBC WITH DIFFERENTIAL/PLATELET - Abnormal; Notable for the following components:      Result Value   RBC 3.37 (*)    Hemoglobin 10.2 (*)    HCT 35.7 (*)    MCV 105.9 (*)    MCHC 28.6 (*)    Neutro Abs 8.6 (*)    Abs Immature Granulocytes 0.09 (*)  All other components within normal limits  COMPREHENSIVE METABOLIC PANEL - Abnormal; Notable for the following components:   Sodium 133 (*)    Potassium 5.3 (*)    Chloride 86 (*)    Glucose, Bld 171 (*)    BUN 62 (*)    Creatinine, Ser 7.71 (*)    Albumin 3.3 (*)    GFR, Estimated 5 (*)    Anion gap 16 (*)    All other components within normal limits  BRAIN NATRIURETIC PEPTIDE - Abnormal; Notable for the following components:   B Natriuretic Peptide 617.7 (*)    All other components within normal limits  TROPONIN I (HIGH SENSITIVITY) - Abnormal; Notable for the following components:   Troponin I (High Sensitivity) 91 (*)    All other components within normal limits  SARS CORONAVIRUS 2 BY RT PCR  TROPONIN I (HIGH SENSITIVITY)    EKG None  Radiology CT Angio Chest PE W and/or Wo Contrast  Result Date: 05/06/2023 CLINICAL DATA:  Short of breath, chest pain EXAM: CT ANGIOGRAPHY CHEST WITH CONTRAST TECHNIQUE: Multidetector CT imaging of the chest was performed using the standard protocol during bolus administration of intravenous contrast. Multiplanar CT image reconstructions and MIPs were obtained to evaluate the vascular anatomy. RADIATION DOSE REDUCTION: This exam was performed according to the departmental dose-optimization program which includes automated exposure  control, adjustment of the mA and/or kV according to patient size and/or use of iterative reconstruction technique. CONTRAST:  75mL OMNIPAQUE IOHEXOL 350 MG/ML SOLN COMPARISON:  05/06/2023, 11/09/2022 FINDINGS: Cardiovascular: This is a technically adequate evaluation of the pulmonary vasculature. No filling defects or pulmonary emboli. The heart is enlarged without pericardial effusion. No evidence of thoracic aortic aneurysm or dissection. Atherosclerosis of the aorta and coronary vasculature. Mediastinum/Nodes: No enlarged mediastinal, hilar, or axillary lymph nodes. Thyroid gland, trachea, and esophagus demonstrate no significant findings. Lungs/Pleura: Left pleural effusion with dense left lower lobe consolidation consistent with compressive atelectasis. This accounts for the density seen at the left lung base on chest x-ray. Central vascular prominence. No airspace disease or pneumothorax. Dependent hypoventilatory changes right lower lobe. Central airways are patent. Upper Abdomen: No acute abnormality. Musculoskeletal: Multiple chronic healed bilateral rib fractures. No acute bony abnormality. Reconstructed images demonstrate no additional findings. Review of the MIP images confirms the above findings. IMPRESSION: 1. No evidence of pulmonary embolus. 2. Cardiomegaly and central pulmonary vascular congestion without overt edema. 3. Left pleural effusion, with compressive left lower lobe atelectasis. 4. Aortic Atherosclerosis (ICD10-I70.0). Coronary artery atherosclerosis. Electronically Signed   By: Sharlet Salina M.D.   On: 05/06/2023 23:03   DG Chest Port 1 View  Result Date: 05/06/2023 CLINICAL DATA:  Short of breath EXAM: PORTABLE CHEST 1 VIEW COMPARISON:  04/13/2023 FINDINGS: Single frontal view of the chest demonstrates marked enlargement of the cardiac silhouette. Stable atherosclerosis of the aorta. Increased central vascular congestion, with increased interstitial prominence concerning for edema.  Retrocardiac density may reflect left basilar consolidation or effusion. No pneumothorax. IMPRESSION: 1. Constellation of findings most consistent with congestive heart failure. 2. Increased density left lung base consistent with consolidation and effusion. Favor atelectasis over pneumonia. Electronically Signed   By: Sharlet Salina M.D.   On: 05/06/2023 20:50    Procedures Procedures    Medications Ordered in ED Medications  sodium zirconium cyclosilicate (LOKELMA) packet 10 g (has no administration in time range)  iohexol (OMNIPAQUE) 350 MG/ML injection 75 mL (75 mLs Intravenous Contrast Given 05/06/23 2239)    ED Course/  Medical Decision Making/ A&P                             Medical Decision Making Amount and/or Complexity of Data Reviewed Labs: ordered. Radiology: ordered.  Risk Prescription drug management.     72 y.o. 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  The patient is normally on 3 L O2 via nasal cannula at home but was hypoxic to 87% on her baseline 3 L and had to be escalated to a nonrebreather at 15 L/min. On arrival, the patient had no complaints and was de-escalated back to her home 3L O2 via nasal cannula. She arrived GCS 15, AAOx3, ABC intact.  On arrival, the patient was afebrile, not tachycardic or tachypneic, saturating at 100% on her home 3 L O2 via nasal cannula, hemodynamically stable.  Initially presented with an episode of chest pressure earlier today.  The patient is an ESRD patient on dialysis Tuesday Thursday Saturday, due for dialysis tomorrow.  She endorses chest pressure.  Brief episode of hypoxia at her facility  which appears to have since resolved that she has now been de-escalated back to her home nasal cannula.  Will initiate workup given the patient's complaint of chest pain earlier today.  Patient EKG revealed left bundle branch block, no acute ischemic changes.  Chest x-ray revealed CHF with an increased density in the left lung base with consolidation and effusion, fever likely atelectasis over pneumonia.  Initial laboratory evaluation significant for COVID-19 PCR negative, CBC without a leukocytosis, mild anemia to 10.2, CMP with mild hyperkalemia to 5.3, elevated BUN to 62, creatinine 7.71 expected for the patient's ESRD.  Initial troponin was elevated at 91 but the patient appears to have a chronically elevated troponin.  BNP was nonspecifically elevated in the setting of the patient's ESRD to 618.  The patient was administered Lokelma orally for her mildly elevated potassium.  CTA PE study revealed the following: IMPRESSION:  1. No evidence of pulmonary embolus.  2. Cardiomegaly and central pulmonary vascular congestion without  overt edema.  3. Left pleural effusion, with compressive left lower lobe  atelectasis.  4. Aortic Atherosclerosis (ICD10-I70.0). Coronary artery  atherosclerosis.   Patient pending delta troponin.  On repeat assessment the patient did complain of mild chest discomfort.  Signout given to Dr. Madilyn Hook pending results of delta troponin and repeat assessment.  If ongoing chest discomfort, consider admission for hemodialysis and further cardiac workup.   Final Clinical Impression(s) / ED Diagnoses Final diagnoses:  Hypoxia  Atypical chest pain  Shortness of breath  Chronic respiratory failure with hypoxia (HCC)  ESRD (end stage renal disease) (HCC)    Rx / DC Orders ED Discharge Orders     None         Ernie Avena, MD 05/06/23 2348

## 2023-05-07 ENCOUNTER — Encounter (HOSPITAL_COMMUNITY): Payer: Self-pay | Admitting: Family Medicine

## 2023-05-07 DIAGNOSIS — I132 Hypertensive heart and chronic kidney disease with heart failure and with stage 5 chronic kidney disease, or end stage renal disease: Secondary | ICD-10-CM | POA: Diagnosis present

## 2023-05-07 DIAGNOSIS — N186 End stage renal disease: Secondary | ICD-10-CM | POA: Diagnosis present

## 2023-05-07 DIAGNOSIS — Z1152 Encounter for screening for COVID-19: Secondary | ICD-10-CM | POA: Diagnosis not present

## 2023-05-07 DIAGNOSIS — J9622 Acute and chronic respiratory failure with hypercapnia: Secondary | ICD-10-CM

## 2023-05-07 DIAGNOSIS — N250 Renal osteodystrophy: Secondary | ICD-10-CM | POA: Diagnosis present

## 2023-05-07 DIAGNOSIS — J441 Chronic obstructive pulmonary disease with (acute) exacerbation: Secondary | ICD-10-CM

## 2023-05-07 DIAGNOSIS — Z992 Dependence on renal dialysis: Secondary | ICD-10-CM | POA: Diagnosis not present

## 2023-05-07 DIAGNOSIS — J9621 Acute and chronic respiratory failure with hypoxia: Secondary | ICD-10-CM | POA: Diagnosis present

## 2023-05-07 DIAGNOSIS — I5042 Chronic combined systolic (congestive) and diastolic (congestive) heart failure: Secondary | ICD-10-CM | POA: Diagnosis not present

## 2023-05-07 DIAGNOSIS — Z9981 Dependence on supplemental oxygen: Secondary | ICD-10-CM | POA: Diagnosis not present

## 2023-05-07 DIAGNOSIS — Z794 Long term (current) use of insulin: Secondary | ICD-10-CM | POA: Diagnosis not present

## 2023-05-07 DIAGNOSIS — G4733 Obstructive sleep apnea (adult) (pediatric): Secondary | ICD-10-CM | POA: Diagnosis present

## 2023-05-07 DIAGNOSIS — E1122 Type 2 diabetes mellitus with diabetic chronic kidney disease: Secondary | ICD-10-CM | POA: Diagnosis present

## 2023-05-07 DIAGNOSIS — T380X5A Adverse effect of glucocorticoids and synthetic analogues, initial encounter: Secondary | ICD-10-CM | POA: Diagnosis present

## 2023-05-07 DIAGNOSIS — E785 Hyperlipidemia, unspecified: Secondary | ICD-10-CM | POA: Diagnosis present

## 2023-05-07 DIAGNOSIS — D631 Anemia in chronic kidney disease: Secondary | ICD-10-CM | POA: Diagnosis present

## 2023-05-07 DIAGNOSIS — G894 Chronic pain syndrome: Secondary | ICD-10-CM

## 2023-05-07 DIAGNOSIS — K5900 Constipation, unspecified: Secondary | ICD-10-CM | POA: Diagnosis not present

## 2023-05-07 DIAGNOSIS — R0602 Shortness of breath: Secondary | ICD-10-CM | POA: Diagnosis present

## 2023-05-07 DIAGNOSIS — I5043 Acute on chronic combined systolic (congestive) and diastolic (congestive) heart failure: Secondary | ICD-10-CM | POA: Diagnosis not present

## 2023-05-07 DIAGNOSIS — C3432 Malignant neoplasm of lower lobe, left bronchus or lung: Secondary | ICD-10-CM | POA: Diagnosis not present

## 2023-05-07 DIAGNOSIS — E875 Hyperkalemia: Secondary | ICD-10-CM | POA: Diagnosis present

## 2023-05-07 DIAGNOSIS — E1151 Type 2 diabetes mellitus with diabetic peripheral angiopathy without gangrene: Secondary | ICD-10-CM | POA: Diagnosis present

## 2023-05-07 DIAGNOSIS — Z993 Dependence on wheelchair: Secondary | ICD-10-CM | POA: Diagnosis not present

## 2023-05-07 DIAGNOSIS — E890 Postprocedural hypothyroidism: Secondary | ICD-10-CM | POA: Diagnosis present

## 2023-05-07 DIAGNOSIS — E1165 Type 2 diabetes mellitus with hyperglycemia: Secondary | ICD-10-CM | POA: Diagnosis present

## 2023-05-07 LAB — BASIC METABOLIC PANEL
Anion gap: 13 (ref 5–15)
Anion gap: 17 — ABNORMAL HIGH (ref 5–15)
BUN: 64 mg/dL — ABNORMAL HIGH (ref 8–23)
BUN: 37 mg/dL — ABNORMAL HIGH (ref 8–23)
CO2: 28 mmol/L (ref 22–32)
CO2: 25 mmol/L (ref 22–32)
Calcium: 9 mg/dL (ref 8.9–10.3)
Calcium: 8.9 mg/dL (ref 8.9–10.3)
Chloride: 90 mmol/L — ABNORMAL LOW (ref 98–111)
Chloride: 90 mmol/L — ABNORMAL LOW (ref 98–111)
Creatinine, Ser: 7.58 mg/dL — ABNORMAL HIGH (ref 0.44–1.00)
Creatinine, Ser: 4.99 mg/dL — ABNORMAL HIGH (ref 0.44–1.00)
GFR, Estimated: 5 mL/min — ABNORMAL LOW (ref >60)
GFR, Estimated: 9 mL/min — ABNORMAL LOW (ref >60)
Glucose, Bld: 206 mg/dL — ABNORMAL HIGH (ref 70–99)
Glucose, Bld: 177 mg/dL — ABNORMAL HIGH (ref 70–99)
Potassium: 5.7 mmol/L — ABNORMAL HIGH (ref 3.5–5.1)
Potassium: 3.8 mmol/L (ref 3.5–5.1)
Sodium: 131 mmol/L — ABNORMAL LOW (ref 135–145)
Sodium: 132 mmol/L — ABNORMAL LOW (ref 135–145)

## 2023-05-07 LAB — I-STAT VENOUS BLOOD GAS, ED
Acid-Base Excess: 1 mmol/L (ref 0.0–2.0)
Acid-Base Excess: 1 mmol/L (ref 0.0–2.0)
Bicarbonate: 32.8 mmol/L — ABNORMAL HIGH (ref 20.0–28.0)
Bicarbonate: 30.3 mmol/L — ABNORMAL HIGH (ref 20.0–28.0)
Calcium, Ion: 1.22 mmol/L (ref 1.15–1.40)
Calcium, Ion: 1.1 mmol/L — ABNORMAL LOW (ref 1.15–1.40)
HCT: 37 % (ref 36.0–46.0)
HCT: 35 % — ABNORMAL LOW (ref 36.0–46.0)
Hemoglobin: 12.6 g/dL (ref 12.0–15.0)
Hemoglobin: 11.9 g/dL — ABNORMAL LOW (ref 12.0–15.0)
O2 Saturation: 62 %
O2 Saturation: 98 %
Potassium: 5.6 mmol/L — ABNORMAL HIGH (ref 3.5–5.1)
Potassium: 5.9 mmol/L — ABNORMAL HIGH (ref 3.5–5.1)
Sodium: 130 mmol/L — ABNORMAL LOW (ref 135–145)
Sodium: 128 mmol/L — ABNORMAL LOW (ref 135–145)
TCO2: 36 mmol/L — ABNORMAL HIGH (ref 22–32)
TCO2: 32 mmol/L (ref 22–32)
pCO2, Ven: 100.5 mmHg (ref 44–60)
pCO2, Ven: 69.9 mmHg — ABNORMAL HIGH (ref 44–60)
pH, Ven: 7.122 — CL (ref 7.25–7.43)
pH, Ven: 7.245 — ABNORMAL LOW (ref 7.25–7.43)
pO2, Ven: 45 mmHg (ref 32–45)
pO2, Ven: 134 mmHg — ABNORMAL HIGH (ref 32–45)

## 2023-05-07 LAB — MAGNESIUM: Magnesium: 1.7 mg/dL (ref 1.7–2.4)

## 2023-05-07 LAB — GLUCOSE, CAPILLARY
Glucose-Capillary: 166 mg/dL — ABNORMAL HIGH (ref 70–99)
Glucose-Capillary: 156 mg/dL — ABNORMAL HIGH (ref 70–99)

## 2023-05-07 LAB — CBC
HCT: 36.8 % (ref 36.0–46.0)
Hemoglobin: 10.5 g/dL — ABNORMAL LOW (ref 12.0–15.0)
MCH: 30.2 pg (ref 26.0–34.0)
MCHC: 28.5 g/dL — ABNORMAL LOW (ref 30.0–36.0)
MCV: 105.7 fL — ABNORMAL HIGH (ref 80.0–100.0)
Platelets: 214 10*3/uL (ref 150–400)
RBC: 3.48 MIL/uL — ABNORMAL LOW (ref 3.87–5.11)
RDW: 15 % (ref 11.5–15.5)
WBC: 13.4 10*3/uL — ABNORMAL HIGH (ref 4.0–10.5)
nRBC: 0.1 % (ref 0.0–0.2)

## 2023-05-07 LAB — CBG MONITORING, ED: Glucose-Capillary: 203 mg/dL — ABNORMAL HIGH (ref 70–99)

## 2023-05-07 LAB — HEPATITIS B SURFACE ANTIGEN: Hepatitis B Surface Ag: NONREACTIVE

## 2023-05-07 LAB — I-STAT CG4 LACTIC ACID, ED: Lactic Acid, Venous: 0.6 mmol/L (ref 0.5–1.9)

## 2023-05-07 MED ORDER — ACETAMINOPHEN 325 MG PO TABS
650.0000 mg | ORAL_TABLET | Freq: Four times a day (QID) | ORAL | Status: DC | PRN
  Administered 2023-05-09 – 2023-05-14 (×7): 650 mg via ORAL
  Filled 2023-05-07 (×8): qty 2

## 2023-05-07 MED ORDER — AZITHROMYCIN 500 MG PO TABS
500.0000 mg | ORAL_TABLET | Freq: Every day | ORAL | Status: AC
  Administered 2023-05-08 – 2023-05-11 (×4): 500 mg via ORAL
  Filled 2023-05-07 (×4): qty 1

## 2023-05-07 MED ORDER — INSULIN ASPART 100 UNIT/ML IJ SOLN: 0.0000 [IU] | Freq: Three times a day (TID) | INTRAMUSCULAR | Status: DC

## 2023-05-07 MED ORDER — METHYLPREDNISOLONE SODIUM SUCC 125 MG IJ SOLR
125.0000 mg | Freq: Two times a day (BID) | INTRAMUSCULAR | Status: AC
  Administered 2023-05-07: 125 mg via INTRAVENOUS
  Filled 2023-05-07: qty 2

## 2023-05-07 MED ORDER — IPRATROPIUM-ALBUTEROL 0.5-2.5 (3) MG/3ML IN SOLN
3.0000 mL | Freq: Once | RESPIRATORY_TRACT | Status: AC
  Administered 2023-05-07: 3 mL via RESPIRATORY_TRACT
  Filled 2023-05-07: qty 3

## 2023-05-07 MED ORDER — CLOPIDOGREL BISULFATE 75 MG PO TABS
75.0000 mg | ORAL_TABLET | Freq: Every day | ORAL | Status: DC
  Administered 2023-05-08 – 2023-05-14 (×7): 75 mg via ORAL
  Filled 2023-05-07 (×8): qty 1

## 2023-05-07 MED ORDER — SEVELAMER CARBONATE 800 MG PO TABS
1600.0000 mg | ORAL_TABLET | Freq: Three times a day (TID) | ORAL | Status: DC
  Administered 2023-05-07 – 2023-05-14 (×19): 1600 mg via ORAL
  Filled 2023-05-07 (×20): qty 2

## 2023-05-07 MED ORDER — ALBUTEROL SULFATE (2.5 MG/3ML) 0.083% IN NEBU
2.5000 mg | INHALATION_SOLUTION | Freq: Once | RESPIRATORY_TRACT | Status: AC
  Administered 2023-05-07: 2.5 mg via RESPIRATORY_TRACT
  Filled 2023-05-07: qty 3

## 2023-05-07 MED ORDER — PREDNISONE 20 MG PO TABS
40.0000 mg | ORAL_TABLET | Freq: Every day | ORAL | Status: DC
  Administered 2023-05-08 – 2023-05-09 (×2): 40 mg via ORAL
  Filled 2023-05-07 (×2): qty 2

## 2023-05-07 MED ORDER — CHLORHEXIDINE GLUCONATE CLOTH 2 % EX PADS
6.0000 | MEDICATED_PAD | Freq: Every day | CUTANEOUS | Status: DC
  Administered 2023-05-08 – 2023-05-14 (×6): 6 via TOPICAL

## 2023-05-07 MED ORDER — MAGNESIUM SULFATE IN D5W 1-5 GM/100ML-% IV SOLN
1.0000 g | Freq: Once | INTRAVENOUS | Status: AC
  Administered 2023-05-07: 1 g via INTRAVENOUS
  Filled 2023-05-07: qty 100

## 2023-05-07 MED ORDER — SODIUM CHLORIDE 0.9% FLUSH
3.0000 mL | Freq: Two times a day (BID) | INTRAVENOUS | Status: DC
  Administered 2023-05-07 – 2023-05-14 (×14): 3 mL via INTRAVENOUS

## 2023-05-07 MED ORDER — CARVEDILOL 6.25 MG PO TABS
6.2500 mg | ORAL_TABLET | Freq: Two times a day (BID) | ORAL | Status: DC
  Administered 2023-05-07 – 2023-05-13 (×11): 6.25 mg via ORAL
  Filled 2023-05-07 (×5): qty 1
  Filled 2023-05-07: qty 2
  Filled 2023-05-07 (×6): qty 1

## 2023-05-07 MED ORDER — ACETAMINOPHEN 650 MG RE SUPP: 650.0000 mg | Freq: Four times a day (QID) | RECTAL | Status: DC | PRN

## 2023-05-07 MED ORDER — HEPARIN SODIUM (PORCINE) 5000 UNIT/ML IJ SOLN
5000.0000 [IU] | Freq: Three times a day (TID) | INTRAMUSCULAR | Status: DC
  Administered 2023-05-07 – 2023-05-14 (×21): 5000 [IU] via SUBCUTANEOUS
  Filled 2023-05-07 (×21): qty 1

## 2023-05-07 MED ORDER — ALLOPURINOL 100 MG PO TABS
100.0000 mg | ORAL_TABLET | Freq: Every day | ORAL | Status: DC
  Administered 2023-05-08 – 2023-05-14 (×7): 100 mg via ORAL
  Filled 2023-05-07 (×8): qty 1

## 2023-05-07 MED ORDER — IPRATROPIUM-ALBUTEROL 0.5-2.5 (3) MG/3ML IN SOLN
3.0000 mL | Freq: Three times a day (TID) | RESPIRATORY_TRACT | Status: DC
  Administered 2023-05-07 – 2023-05-08 (×5): 3 mL via RESPIRATORY_TRACT
  Filled 2023-05-07 (×6): qty 3

## 2023-05-07 MED ORDER — DOXERCALCIFEROL 4 MCG/2ML IV SOLN
3.0000 ug | INTRAVENOUS | Status: DC
  Administered 2023-05-09 – 2023-05-14 (×3): 3 ug via INTRAVENOUS
  Filled 2023-05-07 (×6): qty 2

## 2023-05-07 MED ORDER — LEVOTHYROXINE SODIUM 75 MCG PO TABS
175.0000 ug | ORAL_TABLET | Freq: Every day | ORAL | Status: DC
  Administered 2023-05-07 – 2023-05-14 (×8): 175 ug via ORAL
  Filled 2023-05-07 (×8): qty 1

## 2023-05-07 MED ORDER — ALBUTEROL SULFATE (2.5 MG/3ML) 0.083% IN NEBU: 2.5000 mg | INHALATION_SOLUTION | RESPIRATORY_TRACT | Status: DC | PRN

## 2023-05-07 MED ORDER — PANTOPRAZOLE SODIUM 40 MG PO TBEC
40.0000 mg | DELAYED_RELEASE_TABLET | Freq: Two times a day (BID) | ORAL | Status: DC
  Administered 2023-05-07 – 2023-05-14 (×13): 40 mg via ORAL
  Filled 2023-05-07 (×14): qty 1

## 2023-05-07 MED ORDER — INSULIN ASPART 100 UNIT/ML IJ SOLN
0.0000 [IU] | Freq: Every day | INTRAMUSCULAR | Status: DC
  Administered 2023-05-08: 4 [IU] via SUBCUTANEOUS
  Administered 2023-05-09 – 2023-05-10 (×2): 2 [IU] via SUBCUTANEOUS
  Administered 2023-05-11 – 2023-05-12 (×2): 3 [IU] via SUBCUTANEOUS

## 2023-05-07 MED ORDER — METHYLPREDNISOLONE SODIUM SUCC 125 MG IJ SOLR
125.0000 mg | Freq: Once | INTRAMUSCULAR | Status: AC
  Administered 2023-05-07: 125 mg via INTRAVENOUS
  Filled 2023-05-07: qty 2

## 2023-05-07 MED ORDER — SODIUM CHLORIDE 0.9 % IV SOLN
500.0000 mg | INTRAVENOUS | Status: AC
  Administered 2023-05-07: 500 mg via INTRAVENOUS
  Filled 2023-05-07: qty 5

## 2023-05-07 MED ORDER — INSULIN ASPART 100 UNIT/ML IJ SOLN
0.0000 [IU] | Freq: Three times a day (TID) | INTRAMUSCULAR | Status: DC
  Administered 2023-05-07: 2 [IU] via SUBCUTANEOUS
  Administered 2023-05-08 (×2): 3 [IU] via SUBCUTANEOUS
  Administered 2023-05-08: 2 [IU] via SUBCUTANEOUS
  Administered 2023-05-09: 3 [IU] via SUBCUTANEOUS
  Administered 2023-05-10: 1 [IU] via SUBCUTANEOUS
  Administered 2023-05-10: 7 [IU] via SUBCUTANEOUS
  Administered 2023-05-10 – 2023-05-11 (×2): 5 [IU] via SUBCUTANEOUS

## 2023-05-07 MED ORDER — INSULIN ASPART 100 UNIT/ML IJ SOLN
0.0000 [IU] | Freq: Three times a day (TID) | INTRAMUSCULAR | Status: DC
  Administered 2023-05-07: 2 [IU] via SUBCUTANEOUS

## 2023-05-07 NOTE — H&P (Signed)
History and Physical    EVIENNE [name] LKG:[id number] DOB: August 21, 1951 DOA: 05/06/2023  PCP: Vladimir Crofts, FNP   Patient coming from: ALF   Chief Complaint: SOB   HPI: [name] [name] is a 72 y.o. female with medical history significant for hypertension, insulin-dependent diabetes mellitus, chronic combined systolic and diastolic CHF, COPD with asthma, hypothyroidism, NSCLC s/p curative radiotherapy, and OSA with CPAP intolerance who presents with shortness of breath.  Patient reports that she has been experiencing progressive shortness of breath over the course of more than 2 days but less than a week.  This has been associated with a productive cough.  She denies missing any dialysis appointments.  ED Course: Upon arrival to the ED, patient is found to be afebrile and saturating low 90s on 3 L/min of supplemental oxygen with stable blood pressure.  CTA chest is negative for PE but notable for cardiomegaly with central pulmonary vascular congestion and left pleural effusion.  Labs are most notable for potassium 5.3, bicarbonate 31, BUN 62, normal WBC, BNP 618, troponin 91, and negative COVID-19 PCR.  Patient became obtunded several hours after her arrival in the ED, blood gas was obtained and demonstrates hypercarbia  Patient was treated with Lokelma, albuterol, DuoNeb, 125 mg IV Solu-Medrol, and was placed on BiPAP in the ED.  Review of Systems:  All other systems reviewed and apart from HPI, are negative.  Past Medical History:  Diagnosis Date   Anemia    Arthritis    Asthma    Chronic diastolic (congestive) heart failure (HCC)    Chronic kidney disease    Dialysis T-TH-Sat   Chronic pain syndrome    Class 2 obesity due to excess calories with body mass index (BMI) of 36.0 to 36.9 in adult    COPD (chronic obstructive pulmonary disease) (HCC)    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   Full dentures    GERD (gastroesophageal reflux  disease)    Gout    History of hiatal hernia    History of transfusion    Hx MRSA infection    abscess left groin   Hyperlipidemia    Hypertension    Hypothyroid    Intractable nausea and vomiting 02/15/2021   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HCC)    Peripheral vascular disease (HCC)    Sleep apnea    UNABLE TO TOLERATE C PAP   Stress incontinence    TIA (transient ischemic attack)     X2 NO RESIDUAL PROBLEMS   Wears glasses     Past Surgical History:  Procedure Laterality Date   ABDOMINAL HYSTERECTOMY     AV FISTULA PLACEMENT Left 12/01/2021   Procedure: INSERTION OF LEFT ARM ARTERIOVENOUS (AV) GORE-TEX GRAFT;  Surgeon: Nada Libman, MD;  Location: [facility];  Service: Vascular;  Laterality: Left;   BACK SURGERY  2020   BASCILIC VEIN TRANSPOSITION Left 04/05/2020   Procedure: BASILIC VEIN TRANSPOSITION FIRST STAGE;  Surgeon: Chuck Hint, MD;  Location: [facility] OR;  Service: Vascular;  Laterality: Left;   BRONCHIAL BIOPSY  01/23/2022   Procedure: BRONCHIAL BIOPSIES;  Surgeon: Josephine Igo, DO;  Location: [facility] ENDOS[facility];  Service: Pulmonary;;   BRONCHIAL BIOPSY  08/07/2022   Procedure: BRONCHIAL BIOPSIES;  Surgeon: Josephine Igo, DO;  Location: [facility];  Service: Pulmonary;;   BRONCHIAL BRUSHINGS  08/07/2022   Procedure: BRONCHIAL BRUSHINGS;  Surgeon: Josephine Igo, DO;  [facility]: [facility];  Service: Pulmonary;;  BRONCHIAL NEEDLE ASPIRATION BIOPSY  01/23/2022   Procedure: BRONCHIAL NEEDLE ASPIRATION BIOPSIES;  Surgeon: Josephine Igo, DO;  [facility]: [facility];  Service: Pulmonary;;   BRONCHIAL NEEDLE ASPIRATION BIOPSY  08/07/2022   Procedure: BRONCHIAL NEEDLE ASPIRATION BIOPSIES;  Surgeon: Josephine Igo, DO;  [facility]: [facility];  Service: Pulmonary;;   COLON SURGERY  1995   DUE TO POLYP   DILATION AND CURETTAGE OF UTERUS     ENDOBRONCHIAL ULTRASOUND  01/23/2022   Procedure: ENDOBRONCHIAL ULTRASOUND;  Surgeon: Josephine Igo, DO;  Location: [facility]  ENDOS[facility];  Service: Pulmonary;;   ENTEROSCOPY N/A 07/10/2021   Procedure: ENTEROSCOPY;  Surgeon: Kerin Salen, MD;  Location: [facility] ENDOSCOPY;  Service: Gastroenterology;  Laterality: N/A;   ESOPHAGOGASTRODUODENOSCOPY N/A 01/25/2021   Procedure: ESOPHAGOGASTRODUODENOSCOPY (EGD);  Surgeon: Vida Rigger, MD;  Location: [facility] ENDOSCOPY;  Service: Endoscopy;  Laterality: N/A;   FINE NEEDLE ASPIRATION  01/23/2022   Procedure: FINE NEEDLE ASPIRATION (FNA) LINEAR;  Surgeon: Josephine Igo, DO;  [facility];  Service: Pulmonary;;   FOREARM FRACTURE SURGERY     Left arm   GIVENS CAPSULE STUDY N/A 07/07/2021   Procedure: GIVENS CAPSULE STUDY;  Surgeon: Vida Rigger, MD;  Location: [facility] ENDOSCOPY;  Service: Endoscopy;  Laterality: N/A;   HERNIA REPAIR     w/ mesh   HOT HEMOSTASIS N/A 07/10/2021   Procedure: HOT HEMOSTASIS (ARGON PLASMA COAGULATION/BICAP);  Surgeon: Kerin Salen, MD;  Location: [facility] ENDOSCOPY;  Service: Gastroenterology;  Laterality: N/A;   INCISION AND DRAINAGE ABSCESS Left 02/04/2013   Procedure: INCISION AND DRAINAGE LEFT BUTTOCK ABSCESS; INCISION AND DRAINAGE LEFT BREAST ABSCESS;  Surgeon: Shelly Rubenstein, MD;  Location: [facility];  Service: General;  Laterality: Left;   INCISION AND DRAINAGE ABSCESS N/A 02/12/2013   Procedure: INCISION AND DEBRIDEMENT BUTTOCK WOUND ;  Surgeon: Wilmon Arms. Corliss Skains, MD;  Location: [facility];  Service: General;  Laterality: N/A;   INCISION AND DRAINAGE ABSCESS N/A 02/14/2013   Procedure: INCISION AND DRAINAGE/DRESSING CHANGE;  Surgeon: Shelly Rubenstein, MD;  [facility]: [facility];  Service: General;  Laterality: N/A;   INCISION AND DRAINAGE ABSCESS N/A 03/21/2015   Procedure: INCISION AND DRAINAGE PUBIC ABSCESS;  Surgeon: Glenna Fellows, MD;  Location: [facility] ORS;  Service: General;  Laterality: N/A;   INCISION AND DRAINAGE PERIRECTAL ABSCESS Left 02/10/2013   Procedure: IRRIGATION AND DEBRIDEMENT OF BUTTOCK/PERINEAL ABSCESS;  Surgeon: Wilmon Arms. Corliss Skains, MD;  Location: [facility];   Service: General;  Laterality: Left;   INCISION AND DRAINAGE PERIRECTAL ABSCESS N/A 02/16/2013   Procedure: IRRIGATION AND DEBRIDEMENT PERINEAL ABSCESS;  Surgeon: Liz Malady, MD;  Location: [facility];  Service: General;  Laterality: N/A;   IR FLUORO GUIDE CV LINE RIGHT  11/30/2021   IR US GUIDE VASC ACCESS RIGHT  11/30/2021   IRRIGATION AND DEBRIDEMENT ABSCESS N/A 02/06/2013   Procedure: IRRIGATION AND DEBRIDEMENT BUTTOCK ABSCESS AND DRESSING CHANGE;  Surgeon: Shelly Rubenstein, MD;  [facility];  Service: General;  Laterality: N/A;   IRRIGATION AND DEBRIDEMENT ABSCESS Left 02/08/2013   Procedure: IRRIGATION AND DEBRIDEMENT ABSCESS/DRESSING CHANGE;  Surgeon: Cherylynn Ridges, MD;  Location: [facility];  Service: General;  Laterality: Left;   JOINT REPLACEMENT  2010 / 2012    LAPAROSCOPIC CHOLECYSTECTOMY     LEFT HEART CATHETERIZATION WITH CORONARY ANGIOGRAM N/A 07/02/2014   Procedure: LEFT HEART CATHETERIZATION WITH CORONARY ANGIOGRAM;  Surgeon: Runell Gess, MD;  Location: [facility] CATH LAB;  Service: Cardiovascular;  Laterality: N/A;   LOWER EXTREMITY ANGIOGRAM Right  04/20/2016   Procedure: Lower Extremity Angiogram;  Surgeon: Sherren Kerns, MD;  Location: [facility] INVASIVE CV LAB;  Service: Cardiovascular;  Laterality: Right;   MULTIPLE TOOTH EXTRACTIONS     PERIPHERAL VASCULAR CATHETERIZATION N/A 04/20/2016   Procedure: Abdominal Aortogram;  Surgeon: Sherren Kerns, MD;  Location: [facility] INVASIVE CV LAB;  Service: Cardiovascular;  Laterality: N/A;   PERIPHERAL VASCULAR CATHETERIZATION Right 04/20/2016   Procedure: Peripheral Vascular Intervention;  Surgeon: Sherren Kerns, MD;  Location: [facility] INVASIVE CV LAB;  Service: Cardiovascular;  Laterality: Right;  popiteal   RIGHT HEART CATH N/A 10/27/2018   Procedure: RIGHT HEART CATH;  Surgeon: Laurey Morale, MD;  Location: [facility] INVASIVE CV LAB;  Service: Cardiovascular;  Laterality: N/A;   RIGHT HEART CATH N/A 03/20/2019   Procedure: RIGHT HEART CATH;  Surgeon:  Laurey Morale, MD;  Location: [facility] INVASIVE CV LAB;  Service: Cardiovascular;  Laterality: N/A;   THYROIDECTOMY     TOTAL HIP REVISION Right 08/11/2014   Procedure: RIGHT ACETABULAR REVISION;  Surgeon: Loanne Drilling, MD;  Location: [facility] ORS;  Service: Orthopedics;  Laterality: Right;   VASCULAR SURGERY     VIDEO BRONCHOSCOPY WITH ENDOBRONCHIAL ULTRASOUND Bilateral 08/07/2022   Procedure: VIDEO BRONCHOSCOPY WITH ENDOBRONCHIAL ULTRASOUND;  Surgeon: Josephine Igo, DO;  Location: [facility] ENDOS[facility];  Service: Pulmonary;  Laterality: Bilateral;   VIDEO BRONCHOSCOPY WITH RADIAL ENDOBRONCHIAL ULTRASOUND  01/23/2022   Procedure: RADIAL ENDOBRONCHIAL ULTRASOUND;  Surgeon: Josephine Igo, DO;  Location: [facility] ENDOS[facility];  Service: Pulmonary;;    Social History:   reports that she quit smoking about 18 months ago. Her smoking use included cigarettes. She started smoking about 41 years ago. She has a 10 pack-year smoking history. She has been exposed to tobacco smoke. She has quit using smokeless tobacco. She reports current alcohol use of about 1.0 standard drink of alcohol per week. She reports that she does not use drugs.  Allergies  Allergen Reactions   Nebivolol Swelling    Chest pain (reaction to Bystolic)   Zestril [Lisinopril] Swelling and Other (See Comments)   Ace Inhibitors Swelling and Other (See Comments)    Tongue swell   Morphine And Codeine Itching    Family History  Problem Relation Age of Onset   Diabetes Brother    Cardiomyopathy Mother    Heart disease Mother    Cancer Father      Prior to Admission medications   Medication Sig Start Date End Date Taking? Authorizing Provider  acetaminophen (TYLENOL) 500 MG tablet Take 1,000 mg by mouth 2 (two) times daily.    [provider]  albuterol (PROVENTIL) (2.5 MG/3ML) 0.083% nebulizer solution Take 3 mLs (2.5 mg total) by nebulization every 6 (six) hours as needed for wheezing or shortness of breath. 12/08/21   Demaio, Alexa, MD   albuterol (VENTOLIN HFA) 108 (90 Base) MCG/ACT inhaler Inhale 2 puffs into the lungs every 6 (six) hours as needed for shortness of breath (COPD). 12/29/21   [provider]  allopurinol (ZYLOPRIM) 100 MG tablet Take 1 tablet (100 mg total) by mouth daily. 03/05/21   Medina-Vargas, Monina C, NP  budesonide-formoterol (SYMBICORT) 160-4.5 MCG/ACT inhaler Inhale 2 puffs into the lungs in the morning and at bedtime.    [provider]  carboxymethylcellulose (REFRESH PLUS) 0.5 % SOLN Place 1 drop into both eyes 3 (three) times daily as needed (dry/irritated eyes.).    [provider]  carvedilol (COREG) 6.25 MG tablet Take 1 tablet (6.25 mg total)  by mouth 2 (two) times daily with a meal. 04/17/23   Joseph Art, DO  cetirizine (ZYRTEC) 10 MG tablet Take 10 mg by mouth 2 (two) times daily. 04/17/22   [provider]  clopidogrel (PLAVIX) 75 MG tablet Take 75 mg by mouth daily.    [provider]  diclofenac Sodium (VOLTAREN) 1 % GEL Apply 2 g topically 4 (four) times daily. Patient not taking: Reported on 04/24/2023 04/17/23   Joseph Art, DO  Docusate Sodium 100 MG capsule Take 100 mg by mouth daily. Patient not taking: Reported on 04/24/2023 03/26/23   [provider]  fluticasone (FLONASE) 50 MCG/ACT nasal spray Place 1 spray into both nostrils daily. 06/12/22   [provider]  insulin aspart (NOVOLOG) 100 UNIT/ML injection Inject 0-6 Units into the skin 3 (three) times daily with meals. 04/17/23   Marlin Canary U, DO  ipratropium (ATROVENT) 0.06 % nasal spray Place 2 sprays into both nostrils 4 (four) times daily. 11/23/22 05/05/23  [provider]  levothyroxine (SYNTHROID) 175 MCG tablet Take 175 mcg by mouth daily.    [provider]  lidocaine (LIDODERM) 5 % Place 2 patches onto the skin daily. 1 patch to each side of lower back 12/14/21   [provider]  lidocaine-prilocaine (EMLA) cream Apply 1 Application  topically See admin instructions. On dialysis days. 02/13/22   [provider]  Multiple Vitamins-Iron (MULTIVITAMINS WITH IRON) TABS tablet Take 1 tablet by mouth in the morning.    [provider]  Nutritional Supplements (FEEDING SUPPLEMENT, NEPRO CARB STEADY,) LIQD Take 237 mLs by mouth 2 (two) times daily between meals. 04/17/23   Joseph Art, DO  OXYGEN Inhale into the lungs.    [provider]  pantoprazole (PROTONIX) 40 MG tablet Take 1 tablet (40 mg total) by mouth 2 (two) times daily before a meal. 07/10/21   Amin, Loura Halt, MD  sevelamer carbonate (RENVELA) 800 MG tablet Take 2 tablets (1,600 mg total) by mouth 3 (three) times daily with meals. 04/17/23   Joseph Art, DO  thiamine (VITAMIN B-1) 100 MG tablet Take 100 mg by mouth in the morning.    [provider]    Physical Exam: Vitals:   05/06/23 2310 05/07/23 0100 05/07/23 0123 05/07/23 0257  BP: 129/67 (!) 145/78    Pulse: (!) 103 (!) 103 99   Resp: 15 15 19    Temp: 98.6 F (37 C)   97.8 F (36.6 C)  TempSrc: Oral   Oral  SpO2: 96% 100% 98%   Weight:      Height:        Constitutional: NAD, no pallor or cyanosis   Eyes: PERTLA, lids and conjunctivae normal ENMT: Mucous membranes are moist. Posterior pharynx clear of any exudate or lesions.   Neck: supple, no masses  Respiratory: Prolonged expiratory phase, wheezing. Dyspneic with speech.   Cardiovascular: S1 & S2 heard, regular rate and rhythm. No JVD. Abdomen: No distension, no tenderness, soft. Bowel sounds active.  Musculoskeletal: no clubbing / cyanosis. No joint deformity upper and lower extremities.   Skin: no significant rashes, lesions, ulcers. Warm, dry, well-perfused. Neurologic: CN 2-12 grossly intact. Moving all extremities. Alert and oriented.  Psychiatric: Calm. Cooperative.    Labs and Imaging on Admission: I have personally reviewed following labs and imaging studies  CBC: Recent Labs  Lab  05/06/23 2111 05/07/23 0050  WBC 10.3  --   NEUTROABS 8.6*  --   HGB  10.2* 12.6  HCT 35.7* 37.0  MCV 105.9*  --   PLT 208  --    Basic Metabolic Panel: Recent Labs  Lab 05/06/23 2111 05/07/23 0050  NA 133* 130*  K 5.3* 5.6*  CL 86*  --   CO2 31  --   GLUCOSE 171*  --   BUN 62*  --   CREATININE 7.71*  --   CALCIUM 9.5  --    GFR: Estimated Creatinine Clearance: 7.4 mL/min (A) (by C-G formula based on SCr of 7.71 mg/dL (H)). Liver Function Tests: Recent Labs  Lab 05/06/23 2111  AST 23  ALT 14  ALKPHOS 126  BILITOT 0.6  PROT 6.8  ALBUMIN 3.3*   No results for input(s): "LIPASE", "AMYLASE" in the last 168 hours. No results for input(s): "AMMONIA" in the last 168 hours. Coagulation Profile: No results for input(s): "INR", "PROTIME" in the last 168 hours. Cardiac Enzymes: No results for input(s): "CKTOTAL", "CKMB", "CKMBINDEX", "TROPONINI" in the last 168 hours. BNP (last 3 results) No results for input(s): "PROBNP" in the last 8760 hours. HbA1C: No results for input(s): "HGBA1C" in the last 72 hours. CBG: No results for input(s): "GLUCAP" in the last 168 hours. Lipid Profile: No results for input(s): "CHOL", "HDL", "LDLCALC", "TRIG", "CHOLHDL", "LDLDIRECT" in the last 72 hours. Thyroid Function Tests: No results for input(s): "TSH", "T4TOTAL", "FREET4", "T3FREE", "THYROIDAB" in the last 72 hours. Anemia Panel: No results for input(s): "VITAMINB12", "FOLATE", "FERRITIN", "TIBC", "IRON", "RETICCTPCT" in the last 72 hours. Urine analysis:    Component Value Date/Time   COLORURINE YELLOW 11/27/2021 0956   APPEARANCEUR HAZY (A) 11/27/2021 0956   LABSPEC 1.012 11/27/2021 0956   PHURINE 5.0 11/27/2021 0956   GLUCOSEU NEGATIVE 11/27/2021 0956   HGBUR NEGATIVE 11/27/2021 0956   BILIRUBINUR NEGATIVE 11/27/2021 0956   KETONESUR NEGATIVE 11/27/2021 0956   PROTEINUR 30 (A) 11/27/2021 0956   UROBILINOGEN 0.2 08/04/2014 1409   NITRITE NEGATIVE 11/27/2021 0956    LEUKOCYTESUR NEGATIVE 11/27/2021 0956   Sepsis Labs: @LABRCNTIP (procalcitonin:4,lacticidven:4) ) Recent Results (from the past 240 hour(s))  SARS Coronavirus 2 by RT PCR (hospital order, performed in [facility] hospital lab) *cepheid single result test* Anterior Nasal Swab     Status: None   Collection Time: 05/06/23  7:38 PM   Specimen: Anterior Nasal Swab  Result Value Ref Range Status   SARS Coronavirus 2 by RT PCR NEGATIVE NEGATIVE Final    Comment: Performed at [facility], [address], [place], [place] [address]     Radiological Exams on Admission: CT Angio Chest PE W and/or Wo Contrast  Result Date: 05/06/2023 CLINICAL DATA:  Short of breath, chest pain EXAM: CT ANGIOGRAPHY CHEST WITH CONTRAST TECHNIQUE: Multidetector CT imaging of the chest was performed using the standard protocol during bolus administration of intravenous contrast. Multiplanar CT image reconstructions and MIPs were obtained to evaluate the vascular anatomy. RADIATION DOSE REDUCTION: This exam was performed according to the departmental dose-optimization program which includes automated exposure control, adjustment of the mA and/or kV according to patient size and/or use of iterative reconstruction technique. CONTRAST:  75mL OMNIPAQUE IOHEXOL 350 MG/ML SOLN COMPARISON:  05/06/2023, 11/09/2022 FINDINGS: Cardiovascular: This is a technically adequate evaluation of the pulmonary vasculature. No filling defects or pulmonary emboli. The heart is enlarged without pericardial effusion. No evidence of thoracic aortic aneurysm or dissection. Atherosclerosis of the aorta and coronary vasculature. Mediastinum/Nodes: No enlarged mediastinal, hilar, or axillary lymph nodes. Thyroid gland, trachea, and esophagus demonstrate no significant  findings. Lungs/Pleura: Left pleural effusion with dense left lower lobe consolidation consistent with compressive atelectasis. This accounts for the density seen at the left lung base on  chest x-ray. Central vascular prominence. No airspace disease or pneumothorax. Dependent hypoventilatory changes right lower lobe. Central airways are patent. Upper Abdomen: No acute abnormality. Musculoskeletal: Multiple chronic healed bilateral rib fractures. No acute bony abnormality. Reconstructed images demonstrate no additional findings. Review of the MIP images confirms the above findings. IMPRESSION: 1. No evidence of pulmonary embolus. 2. Cardiomegaly and central pulmonary vascular congestion without overt edema. 3. Left pleural effusion, with compressive left lower lobe atelectasis. 4. Aortic Atherosclerosis (ICD10-I70.0). Coronary artery atherosclerosis. Electronically Signed   By: Sharlet Salina M.D.   On: 05/06/2023 23:03   DG Chest Port 1 View  Result Date: 05/06/2023 CLINICAL DATA:  Short of breath EXAM: PORTABLE CHEST 1 VIEW COMPARISON:  04/13/2023 FINDINGS: Single frontal view of the chest demonstrates marked enlargement of the cardiac silhouette. Stable atherosclerosis of the aorta. Increased central vascular congestion, with increased interstitial prominence concerning for edema. Retrocardiac density may reflect left basilar consolidation or effusion. No pneumothorax. IMPRESSION: 1. Constellation of findings most consistent with congestive heart failure. 2. Increased density left lung base consistent with consolidation and effusion. Favor atelectasis over pneumonia. Electronically Signed   By: Sharlet Salina M.D.   On: 05/06/2023 20:50    EKG: Independently reviewed. Sinus tachycardia, rate 100, non-specific IVCD.   Assessment/Plan   1. Acute exacerbation in COPD with asthma; acute on chronic hypoxic & hypercarbic respiratory failure  - Culture sputum, start azithromycin, continue systemic steroids, schedule DuoNebs, use additional albuterol as needed, continue supplemental O2    2. ESRD  - Potassium was slightly high and treated with Lokelma in ED  - Restrict fluids, renally-dose  medications, continue binder, repeat chem panel in am, consult nephrology in am for maintenance dialysis   3. Chronic combined systolic & diastolic CHF  - Appears compensated   - Continue Coreg, restrict fluids, monitor volume status    4. Type II DM  - A1c was 7.1% earlier this month  - Continue CBG checks and SSI    DVT prophylaxis: sq heparin  Code Status: Full  Level of Care: Level of care: Progressive Family Communication: None present  Disposition Plan:  Patient is from: ALF  Anticipated d/c is to: TBD Anticipated d/c date is: 05/09/23  Patient currently: Pending improved respiratory status, will likely need inpatient HD  Consults called: none  Admission status: Inpatient     Briscoe Deutscher, MD [facility]  05/07/2023, 3:07 AM

## 2023-05-07 NOTE — Progress Notes (Signed)
   05/07/23 1615  Vitals  Temp 98.1 F (36.7 C)  Pulse Rate (!) 107  Resp (!) 21  BP 129/73  SpO2 93 %  O2 Device Nasal Cannula  Oxygen Therapy  O2 Flow Rate (L/min) 4 L/min  Patient Activity (if Appropriate) In bed  Pulse Oximetry Type Continuous  Oximetry Probe Site Changed No  Post Treatment  Dialyzer Clearance Lightly streaked  Duration of HD Treatment -hour(s) 3.5 hour(s)  Hemodialysis Intake (mL) 0 mL  Liters Processed 96.8  Fluid Removed (mL) 3700 mL  Tolerated HD Treatment Yes  AVG/AVF Arterial Site Held (minutes) 10 minutes  AVG/AVF Venous Site Held (minutes) 10 minutes   Received patient in bed to unit.  Alert and oriented.  Informed consent signed and in chart.   TX duration:3.5  Patient tolerated well.  Transported back to the room  Alert, without acute distress.remains somnulant but much more responsive then the beginning of the tx  Hand-off given to patient's nurse.   Access used: LUAF Access issues: no complicat  Total UF removed: 3700 Medication(s) given: none   [facility]

## 2023-05-07 NOTE — Plan of Care (Signed)
New patient

## 2023-05-07 NOTE — Consult Note (Signed)
[facility] Renal Consultation Note  Indication for Consultation:  Management of ESRD/hemodialysis;  HPI: [name] [name] is a 72 y.o. female with a past medical history of ESRD, chronic systolic and diastolic CHF, asthma, anemia of CKD, DM 2, chronic respiratory failure on 2 L [place] O2 at home, COPD, OSA, hypertension, PVD, obesity, hyperlipidemia, GERD, history of non-small cell lung cancer . She presents  from SNF  with progressive sob over past few days . No missed HD TTS  past Week but only ~~2 hr of 4 hr tx sat.  IN ER  Low 90 % O2 Sat  on 3l  O2, IN ER became obtunded several hours after her arrival in the ED, blood gas was obtained and demonstrates hypercarbia  place on BIPAP   CT Scan chest / neg  for PE, Noted cardiomegaly with central pulmonary vascular congestion and left pleural effusion.  K 5.3  Nl Wbc ,   BNP 618, troponin 91, and negative COVID-19 PCR.   Currently  on Hd [place]  O2  progressed from BiPAP  and still somewhat obtunded ,  but awakens to answer =[facility] , HD TTS , moved extrem to request  , no sob  ,tolerating 4l uf goal        Past Medical History:  Diagnosis Date   Anemia    Arthritis    Asthma    Chronic diastolic (congestive) heart failure (HCC)    Chronic kidney disease    Dialysis T-TH-Sat   Chronic pain syndrome    Class 2 obesity due to excess calories with body mass index (BMI) of 36.0 to 36.9 in adult    COPD (chronic obstructive pulmonary disease) (HCC)    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   Full dentures    GERD (gastroesophageal reflux disease)    Gout    History of hiatal hernia    History of transfusion    Hx MRSA infection    abscess left groin   Hyperlipidemia    Hypertension    Hypothyroid    Intractable nausea and vomiting 02/15/2021   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HCC)    Peripheral vascular disease (HCC)    Sleep apnea    UNABLE TO TOLERATE C PAP   Stress incontinence    TIA  (transient ischemic attack)     X2 NO RESIDUAL PROBLEMS   Wears glasses     Past Surgical History:  Procedure Laterality Date   ABDOMINAL HYSTERECTOMY     AV FISTULA PLACEMENT Left 12/01/2021   Procedure: INSERTION OF LEFT ARM ARTERIOVENOUS (AV) GORE-TEX GRAFT;  Surgeon: Nada Libman, MD;  [facility];  Service: Vascular;  Laterality: Left;   BACK SURGERY  2020   BASCILIC VEIN TRANSPOSITION Left 04/05/2020   Procedure: BASILIC VEIN TRANSPOSITION FIRST STAGE;  Surgeon: Chuck Hint, MD;  Location: [facility] OR;  Service: Vascular;  Laterality: Left;   BRONCHIAL BIOPSY  01/23/2022   Procedure: BRONCHIAL BIOPSIES;  Surgeon: Josephine Igo, DO;  [facility] ENDOSCOPY;  Service: Pulmonary;;   BRONCHIAL BIOPSY  08/07/2022   Procedure: BRONCHIAL BIOPSIES;  Surgeon: Josephine Igo, DO;  [facility]OS[facility];  Service: Pulmonary;;   BRONCHIAL BRUSHINGS  08/07/2022   Procedure: BRONCHIAL BRUSHINGS;  Surgeon: Josephine Igo, DO;  [facility];  Service: Pulmonary;;   BRONCHIAL NEEDLE ASPIRATION BIOPSY  01/23/2022   Procedure: BRONCHIAL NEEDLE ASPIRATION BIOPSIES;  Surgeon: Josephine Igo, DO;  [facility]OS[facility];  Service: Pulmonary;;   BRONCHIAL NEEDLE ASPIRATION BIOPSY  08/07/2022   Procedure: BRONCHIAL NEEDLE ASPIRATION BIOPSIES;  Surgeon: Josephine Igo, DO;  [facility];  Service: Pulmonary;;   COLON SURGERY  1995   DUE TO POLYP   DILATION AND CURETTAGE OF UTERUS     ENDOBRONCHIAL ULTRASOUND  01/23/2022   Procedure: ENDOBRONCHIAL ULTRASOUND;  Surgeon: Josephine Igo, DO;  [facility];  Service: Pulmonary;;   ENTEROSCOPY N/A 07/10/2021   Procedure: ENTEROSCOPY;  Surgeon: Kerin Salen, MD;  Location: [facility] ENDOSCOPY;  Service: Gastroenterology;  Laterality: N/A;   ESOPHAGOGASTRODUODENOSCOPY N/A 01/25/2021   Procedure: ESOPHAGOGASTRODUODENOSCOPY (EGD);  Surgeon: Vida Rigger, MD;  Location: [facility] ENDOSCOPY;  Service: Endoscopy;  Laterality: N/A;    FINE NEEDLE ASPIRATION  01/23/2022   Procedure: FINE NEEDLE ASPIRATION (FNA) LINEAR;  Surgeon: Josephine Igo, DO;  [facility];  Service: Pulmonary;;   FOREARM FRACTURE SURGERY     Left arm   GIVENS CAPSULE STUDY N/A 07/07/2021   Procedure: GIVENS CAPSULE STUDY;  Surgeon: Vida Rigger, MD;  Location: [facility] ENDOSCOPY;  Service: Endoscopy;  Laterality: N/A;   HERNIA REPAIR     w/ mesh   HOT HEMOSTASIS N/A 07/10/2021   Procedure: HOT HEMOSTASIS (ARGON PLASMA COAGULATION/BICAP);  Surgeon: Kerin Salen, MD;  Location: [facility] ENDOSCOPY;  Service: Gastroenterology;  Laterality: N/A;   INCISION AND DRAINAGE ABSCESS Left 02/04/2013   Procedure: INCISION AND DRAINAGE LEFT BUTTOCK ABSCESS; INCISION AND DRAINAGE LEFT BREAST ABSCESS;  Surgeon: Shelly Rubenstein, MD;  [facility];  Service: General;  Laterality: Left;   INCISION AND DRAINAGE ABSCESS N/A 02/12/2013   Procedure: INCISION AND DEBRIDEMENT BUTTOCK WOUND ;  Surgeon: Wilmon Arms. Corliss Skains, MD;  [facility];  Service: General;  Laterality: N/A;   INCISION AND DRAINAGE ABSCESS N/A 02/14/2013   Procedure: INCISION AND DRAINAGE/DRESSING CHANGE;  Surgeon: Shelly Rubenstein, MD;  [facility];  Service: General;  Laterality: N/A;   INCISION AND DRAINAGE ABSCESS N/A 03/21/2015   Procedure: INCISION AND DRAINAGE PUBIC ABSCESS;  Surgeon: Glenna Fellows, MD;  Location: [facility] ORS;  Service: General;  Laterality: N/A;   INCISION AND DRAINAGE PERIRECTAL ABSCESS Left 02/10/2013   Procedure: IRRIGATION AND DEBRIDEMENT OF BUTTOCK/PERINEAL ABSCESS;  Surgeon: Wilmon Arms. Corliss Skains, MD;  [facility];  Service: General;  Laterality: Left;   INCISION AND DRAINAGE PERIRECTAL ABSCESS N/A 02/16/2013   Procedure: IRRIGATION AND DEBRIDEMENT PERINEAL ABSCESS;  Surgeon: Liz Malady, MD;  [facility];  Service: General;  Laterality: N/A;   IR FLUORO GUIDE CV LINE RIGHT  11/30/2021   IR US GUIDE VASC ACCESS RIGHT  11/30/2021   IRRIGATION AND DEBRIDEMENT ABSCESS N/A  02/06/2013   Procedure: IRRIGATION AND DEBRIDEMENT BUTTOCK ABSCESS AND DRESSING CHANGE;  Surgeon: Shelly Rubenstein, MD;  [facility];  Service: General;  Laterality: N/A;   IRRIGATION AND DEBRIDEMENT ABSCESS Left 02/08/2013   Procedure: IRRIGATION AND DEBRIDEMENT ABSCESS/DRESSING CHANGE;  Surgeon: Cherylynn Ridges, MD;  [facility];  Service: General;  Laterality: Left;   JOINT REPLACEMENT  2010 / 2012    LAPAROSCOPIC CHOLECYSTECTOMY     LEFT HEART CATHETERIZATION WITH CORONARY ANGIOGRAM N/A 07/02/2014   Procedure: LEFT HEART CATHETERIZATION WITH CORONARY ANGIOGRAM;  Surgeon: Runell Gess, MD;  Location: [facility] CATH LAB;  Service: Cardiovascular;  Laterality: N/A;   LOWER EXTREMITY ANGIOGRAM Right 04/20/2016   Procedure: Lower Extremity Angiogram;  Surgeon: Sherren Kerns, MD;  Location: [facility] INVASIVE CV LAB;  Service:  Cardiovascular;  Laterality: Right;   MULTIPLE TOOTH EXTRACTIONS     PERIPHERAL VASCULAR CATHETERIZATION N/A 04/20/2016   Procedure: Abdominal Aortogram;  Surgeon: Sherren Kerns, MD;  Location: [facility] INVASIVE CV LAB;  Service: Cardiovascular;  Laterality: N/A;   PERIPHERAL VASCULAR CATHETERIZATION Right 04/20/2016   Procedure: Peripheral Vascular Intervention;  Surgeon: Sherren Kerns, MD;  Location: [facility] INVASIVE CV LAB;  Service: Cardiovascular;  Laterality: Right;  popiteal   RIGHT HEART CATH N/A 10/27/2018   Procedure: RIGHT HEART CATH;  Surgeon: Laurey Morale, MD;  Location: [facility] INVASIVE CV LAB;  Service: Cardiovascular;  Laterality: N/A;   RIGHT HEART CATH N/A 03/20/2019   Procedure: RIGHT HEART CATH;  Surgeon: Laurey Morale, MD;  Location: [facility] INVASIVE CV LAB;  Service: Cardiovascular;  Laterality: N/A;   THYROIDECTOMY     TOTAL HIP REVISION Right 08/11/2014   Procedure: RIGHT ACETABULAR REVISION;  Surgeon: Loanne Drilling, MD;  Location: [facility] ORS;  Service: Orthopedics;  Laterality: Right;   VASCULAR SURGERY     VIDEO BRONCHOSCOPY WITH ENDOBRONCHIAL ULTRASOUND Bilateral  08/07/2022   Procedure: VIDEO BRONCHOSCOPY WITH ENDOBRONCHIAL ULTRASOUND;  Surgeon: Josephine Igo, DO;  [facility];  Service: Pulmonary;  Laterality: Bilateral;   VIDEO BRONCHOSCOPY WITH RADIAL ENDOBRONCHIAL ULTRASOUND  01/23/2022   Procedure: RADIAL ENDOBRONCHIAL ULTRASOUND;  Surgeon: Josephine Igo, DO;  [facility];  Service: Pulmonary;;      Family History  Problem Relation Age of Onset   Diabetes Brother    Cardiomyopathy Mother    Heart disease Mother    Cancer Father       reports that she quit smoking about 18 months ago. Her smoking use included cigarettes. She started smoking about 41 years ago. She has a 10 pack-year smoking history. She has been exposed to tobacco smoke. She has quit using smokeless tobacco. She reports current alcohol use of about 1.0 standard drink of alcohol per week. She reports that she does not use drugs.   Allergies  Allergen Reactions   Nebivolol Swelling    Chest pain (reaction to Bystolic)   Zestril [Lisinopril] Swelling and Other (See Comments)   Ace Inhibitors Swelling and Other (See Comments)    Tongue swell   Morphine And Codeine Itching    Prior to Admission medications   Medication Sig Start Date End Date Taking? Authorizing Provider  acetaminophen (TYLENOL) 500 MG tablet Take 1,000 mg by mouth See admin instructions. Take 1,000mg  (2 tablets) by mouth twice a day in addition to 1,000mg  (2 tablets) twice daily as needed for pain.   Yes [provider]  albuterol (PROVENTIL) (2.5 MG/3ML) 0.083% nebulizer solution Take 3 mLs (2.5 mg total) by nebulization every 6 (six) hours as needed for wheezing or shortness of breath. 12/08/21  Yes Demaio, Alexa, MD  albuterol (VENTOLIN HFA) 108 (90 Base) MCG/ACT inhaler Inhale 2 puffs into the lungs every 6 (six) hours as needed for shortness of breath (COPD). 12/29/21  Yes [provider]  allopurinol (ZYLOPRIM) 100 MG tablet Take 1 tablet (100 mg total) by mouth  daily. 03/05/21  Yes Medina-Vargas, Monina C, NP  budesonide-formoterol (SYMBICORT) 160-4.5 MCG/ACT inhaler Inhale 2 puffs into the lungs in the morning and at bedtime.   Yes [provider]  carboxymethylcellulose (REFRESH PLUS) 0.5 % SOLN Place 1 drop into both eyes 3 (three) times daily as needed (dry/irritated eyes.).   Yes [provider]  carvedilol (COREG) 6.25 MG tablet Take 1 tablet (6.25 mg total) by  mouth 2 (two) times daily with a meal. 04/17/23  Yes Vann, Jessica U, DO  cetirizine (ZYRTEC) 10 MG tablet Take 10 mg by mouth 2 (two) times daily. 04/17/22  Yes [provider]  clopidogrel (PLAVIX) 75 MG tablet Take 75 mg by mouth daily.   Yes [provider]  diclofenac Sodium (VOLTAREN) 1 % GEL Apply 2 g topically 4 (four) times daily. 04/17/23  Yes Joseph Art, DO  Docusate Sodium 100 MG capsule Take 100 mg by mouth daily. 03/26/23  Yes [provider]  fluticasone (FLONASE) 50 MCG/ACT nasal spray Place 1 spray into both nostrils daily. 06/12/22  Yes [provider]  insulin aspart (NOVOLOG) 100 UNIT/ML injection Inject 0-6 Units into the skin 3 (three) times daily with meals. Patient taking differently: Inject 0-12 Units into the skin 3 (three) times daily before meals. 04/17/23  Yes Vann, Jessica U, DO  ipratropium (ATROVENT) 0.06 % nasal spray Place 2 sprays into both nostrils 4 (four) times daily. 11/23/22 05/07/23 Yes [provider]  levothyroxine (SYNTHROID) 175 MCG tablet Take 175 mcg by mouth daily.   Yes [provider]  lidocaine 4 % Place 2 patches onto the skin daily. Each lower side of back.   Yes [provider]  lidocaine-prilocaine (EMLA) cream Apply 1 Application topically See admin instructions. On dialysis days (Tues,Thurs,Sat) 02/13/22  Yes [provider]  Multiple Vitamins-Iron (MULTIVITAMINS WITH IRON) TABS tablet Take 1 tablet by mouth in the morning.   Yes [provider]   Nutritional Supplements (FEEDING SUPPLEMENT, NEPRO CARB STEADY,) LIQD Take 237 mLs by mouth 2 (two) times daily between meals. 04/17/23  Yes Vann, Jessica U, DO  oxycodone (OXY-IR) 5 MG capsule Take 5 mg by mouth every 6 (six) hours as needed for pain (PRN for moderate to severe pain in knees).   Yes [provider]  pantoprazole (PROTONIX) 40 MG tablet Take 1 tablet (40 mg total) by mouth 2 (two) times daily before a meal. 07/10/21  Yes Amin, Ankit Chirag, MD  polyethylene glycol (MIRALAX / GLYCOLAX) 17 g packet Take 17 g by mouth 2 (two) times daily. 05/06/23 05/09/23 Yes [provider]  sevelamer carbonate (RENVELA) 800 MG tablet Take 2 tablets (1,600 mg total) by mouth 3 (three) times daily with meals. 04/17/23  Yes Marlin Canary U, DO  thiamine (VITAMIN B-1) 100 MG tablet Take 100 mg by mouth in the morning.   Yes [provider]  OXYGEN Inhale into the lungs.    [provider]      Results for orders placed or performed during the hospital encounter of 05/06/23 (from the past 48 hour(s))  SARS Coronavirus 2 by RT PCR (hospital order, performed in [facility] hospital lab) *cepheid single result test* Anterior Nasal Swab     Status: None   Collection Time: 05/06/23  7:38 PM   Specimen: Anterior Nasal Swab  Result Value Ref Range   SARS Coronavirus 2 by RT PCR NEGATIVE NEGATIVE    Comment: Performed at [facility] Lab, [address] [place], [place] [address]  CBC with Differential     Status: Abnormal   Collection Time: 05/06/23  9:11 PM  Result Value Ref Range   WBC 10.3 4.0 - 10.5 K/uL   RBC 3.37 (L) 3.87 - 5.11 MIL/uL   Hemoglobin 10.2 (L) 12.0 - 15.0 g/dL   HCT 47.8 (L) 29.5 - 62.1 %   MCV 105.9 (H) 80.0 - 100.0 fL  MCH 30.3 26.0 - 34.0 pg   MCHC 28.6 (L) 30.0 - 36.0 g/dL   RDW 96.0 45.4 - 09.8 %   Platelets 208 150 - 400 K/uL   nRBC 0.0 0.0 - 0.2 %   Neutrophils Relative % 83 %   Neutro Abs 8.6 (H) 1.7 - 7.7 K/uL   Lymphocytes Relative  8 %   Lymphs Abs 0.8 0.7 - 4.0 K/uL   Monocytes Relative 8 %   Monocytes Absolute 0.8 0.1 - 1.0 K/uL   Eosinophils Relative 0 %   Eosinophils Absolute 0.0 0.0 - 0.5 K/uL   Basophils Relative 0 %   Basophils Absolute 0.0 0.0 - 0.1 K/uL   Immature Granulocytes 1 %   Abs Immature Granulocytes 0.09 (H) 0.00 - 0.07 K/uL    Comment: Performed at [facility] Lab, [address], [place], [place] [address]  Comprehensive metabolic panel     Status: Abnormal   Collection Time: 05/06/23  9:11 PM  Result Value Ref Range   Sodium 133 (L) 135 - 145 mmol/L   Potassium 5.3 (H) 3.5 - 5.1 mmol/L   Chloride 86 (L) 98 - 111 mmol/L   CO2 31 22 - 32 mmol/L   Glucose, Bld 171 (H) 70 - 99 mg/dL    Comment: Glucose reference range applies only to samples taken after fasting for at least 8 hours.   BUN 62 (H) 8 - 23 mg/dL   Creatinine, Ser 7.82 (H) 0.44 - 1.00 mg/dL   Calcium 9.5 8.9 - 95.6 mg/dL   Total Protein 6.8 6.5 - 8.1 g/dL   Albumin 3.3 (L) 3.5 - 5.0 g/dL   AST 23 15 - 41 U/L   ALT 14 0 - 44 U/L   Alkaline Phosphatase 126 38 - 126 U/L   Total Bilirubin 0.6 0.3 - 1.2 mg/dL   GFR, Estimated 5 (L) >60 mL/min    Comment: (NOTE) Calculated using the CKD-EPI Creatinine Equation (2021)    Anion gap 16 (H) 5 - 15    Comment: Performed at [facility] Lab, [address], [place], [place] [address]  Troponin I (High Sensitivity)     Status: Abnormal   Collection Time: 05/06/23  9:11 PM  Result Value Ref Range   Troponin I (High Sensitivity) 91 (H) <18 ng/L    Comment: (NOTE) Elevated high sensitivity troponin I (hsTnI) values and significant  changes across serial measurements may suggest ACS but many other  chronic and acute conditions are known to elevate hsTnI results.  Refer to the "Links" section for chest pain algorithms and additional  guidance. Performed at [facility] Lab, [address], [place], [place] [address]   Brain natriuretic peptide     Status: Abnormal   Collection  Time: 05/06/23  9:11 PM  Result Value Ref Range   B Natriuretic Peptide 617.7 (H) 0.0 - 100.0 pg/mL    Comment: Performed at [facility] Lab, [address], [place], [place] [address]  Troponin I (High Sensitivity)     Status: Abnormal   Collection Time: 05/06/23 11:24 PM  Result Value Ref Range   Troponin I (High Sensitivity) 94 (H) <18 ng/L    Comment: (NOTE) Elevated high sensitivity troponin I (hsTnI) values and significant  changes across serial measurements may suggest ACS but many other  chronic and acute conditions are known to elevate hsTnI results.  Refer to the "Links" section for chest pain algorithms and additional  guidance. Performed at [facility], [address], [place], [place] [address]   I-Stat venous blood gas, ED     Status: Abnormal   Collection Time: 05/07/23 12:50 AM  Result Value Ref Range   pH, Ven 7.122 (LL) 7.25 - 7.43   pCO2, Ven 100.5 (HH) 44 - 60 mmHg   pO2, Ven 45 32 - 45 mmHg   Bicarbonate 32.8 (H) 20.0 - 28.0 mmol/L   TCO2 36 (H) 22 - 32 mmol/L   O2 Saturation 62 %   Acid-Base Excess 1.0 0.0 - 2.0 mmol/L   Sodium 130 (L) 135 - 145 mmol/L   Potassium 5.6 (H) 3.5 - 5.1 mmol/L   Calcium, Ion 1.22 1.15 - 1.40 mmol/L   HCT 37.0 36.0 - 46.0 %   Hemoglobin 12.6 12.0 - 15.0 g/dL   Sample type VENOUS    Comment NOTIFIED PHYSICIAN   I-Stat CG4 Lactic Acid, ED     Status: None   Collection Time: 05/07/23  2:58 AM  Result Value Ref Range   Lactic Acid, Venous 0.6 0.5 - 1.9 mmol/L  I-Stat venous blood gas, ED     Status: Abnormal   Collection Time: 05/07/23  3:42 AM  Result Value Ref Range   pH, Ven 7.245 (L) 7.25 - 7.43   pCO2, Ven 69.9 (H) 44 - 60 mmHg   pO2, Ven 134 (H) 32 - 45 mmHg   Bicarbonate 30.3 (H) 20.0 - 28.0 mmol/L   TCO2 32 22 - 32 mmol/L   O2 Saturation 98 %   Acid-Base Excess 1.0 0.0 - 2.0 mmol/L   Sodium 128 (L) 135 - 145 mmol/L   Potassium 5.9 (H) 3.5 - 5.1 mmol/L   Calcium, Ion 1.10 (L) 1.15 - 1.40 mmol/L   HCT  35.0 (L) 36.0 - 46.0 %   Hemoglobin 11.9 (L) 12.0 - 15.0 g/dL   Sample type VENOUS    Comment NOTIFIED PHYSICIAN   Basic metabolic panel     Status: Abnormal   Collection Time: 05/07/23  6:19 AM  Result Value Ref Range   Sodium 131 (L) 135 - 145 mmol/L   Potassium 5.7 (H) 3.5 - 5.1 mmol/L   Chloride 90 (L) 98 - 111 mmol/L   CO2 28 22 - 32 mmol/L   Glucose, Bld 206 (H) 70 - 99 mg/dL    Comment: Glucose reference range applies only to samples taken after fasting for at least 8 hours.   BUN 64 (H) 8 - 23 mg/dL   Creatinine, Ser 2.02 (H) 0.44 - 1.00 mg/dL   Calcium 9.0 8.9 - 54.2 mg/dL   GFR, Estimated 5 (L) >60 mL/min    Comment: (NOTE) Calculated using the CKD-EPI Creatinine Equation (2021)    Anion gap 13 5 - 15    Comment: Performed at [facility] Lab, [address] [place], [place] [address]  CBC     Status: Abnormal   Collection Time: 05/07/23  6:19 AM  Result Value Ref Range   WBC 13.4 (H) 4.0 - 10.5 K/uL   RBC 3.48 (L) 3.87 - 5.11 MIL/uL   Hemoglobin 10.5 (L) 12.0 - 15.0 g/dL   HCT 76.2 83.1 - 51.7 %   MCV 105.7 (H) 80.0 - 100.0 fL   MCH 30.2 26.0 - 34.0 pg   MCHC 28.5 (L) 30.0 - 36.0 g/dL   RDW 61.6 07.3 - 71.0 %   Platelets 214 150 - 400 K/uL   nRBC 0.1 0.0 - 0.2 %  Comment: Performed at [facility] Lab, [address], [place], [place] [address]  CBG monitoring, ED     Status: Abnormal   Collection Time: 05/07/23  8:12 AM  Result Value Ref Range   Glucose-Capillary 203 (H) 70 - 99 mg/dL    Comment: Glucose reference range applies only to samples taken after fasting for at least 8 hours.  .  ROS:  see hpi. Pt with  some somnolence unable to give complete history    Physical Exam: Vitals:   05/07/23 1011 05/07/23 1030  BP: (!) 158/77 (!) 155/78  Pulse: (!) 105 (!) 101  Resp: (!) 30 16  Temp: 98.2 F (36.8 C)   SpO2: 96% 100%     General: obese , obtunded elderly female chronically ill appearing  NAD on HD  HEENT: [place]  perrla not icteric  Neck:  supple  no jvd  Heart: RRR , no MRG Lungs: BiLat rales and fine exp wheezing  ,non labored on [place] o2   2l Abdomen: Obese, NABS , Soft , NT  abd wall edema  Extremities: trace bipedal edema  Skin:  n overt ulcer or rash  Neuro:  somnolent , awaken with voice and touch  stimuli OX3 , Moves extrem to request   Dialysis Access: patent on HD LUA AVG    OP Dialysis Orders:  [facility] , TTS 4 hours , EDW 100.8 kg  2K 2 Ca bath, LUA AVG heparin 2000 units  Mircera 50 mcg every 2 weeks last dose 05/04/23  venofer 50 mg q wkly , Hectorol 3 mcg q. HD    Assessment/Plan   Acute on chronic hypoxic & hypercarbic respiratory failure = on Iv Abx   Steroids , Duo nebs   Tx per Admit RX   Combined systolic & diastolic CHF = tolerating UF  on HD  now ESRD -  Hd  TTS  Hypertension/volume  - BP  ok , vol as abv  Anemia  - hgb  10.5  ( just has esa 7/27)  Metabolic bone disease -  Ca corec  ok, continue Hectorol next hd  thurs phos 6.8  Phos binders  with diet   DM  Type 2 = per admit team    Lenny Pastel, PA-C [facility] Beeper [phone] 05/07/2023, 10:39 AM

## 2023-05-07 NOTE — Progress Notes (Signed)
New patient  admitted from ed then to hemo then room 17. Patient  is alert to self and place. Patient is forgetful. MP shows sr/stach. Skin goo. Patient has healed old scars noted generalized. Fistula dressing dry and intact to left upper arm. IV saline locked in right upper arm. No skin issues noted. Patient bathed and oriented to room bed and call bell. Patient denies having meds with her.

## 2023-05-07 NOTE — Procedures (Signed)
I was present at this dialysis session, have reviewed the session itself and made  appropriate changes  LUE AVG no issues UFG 4L, BP 115/70 50% of tx completed  Estill Bakes MD [facility] pager [phone]   05/07/2023, 1:50 PM

## 2023-05-07 NOTE — Progress Notes (Signed)
PROGRESS NOTE  [name] [name] WUJ:[id number] DOB: Dec 09, 1950   PCP: Vladimir Crofts, FNP  Patient is from: Home.  DOA: 05/06/2023 LOS: 0  Chief complaints Chief Complaint  Patient presents with   Shortness of Breath   Chest Pain     Brief Narrative / Interim history: 72 year old F with PMH of IDDM-2, combined CHF, ESRD on HD TTS, COPD, OSA not on CPAP, NSCLC s/p radiotherapy, HTN and morbid obesity admitted earlier this morning due to acute on chronic respiratory failure with hypoxia and hypercapnia in the setting of COPD exacerbation after she presented to ED with 2 days of shortness of breath and productive cough.  Initially, she was saturating in low 90s on 3 L by nasal cannula with stable vitals.  CTA chest was negative for PE but concern for CHF.  She became obtunded several hours after her arrival in ED and blood gas showed acute on chronic respiratory acidosis with hypercarbia to 100.  Attempted to place on BiPAP but patient did not tolerate.  Likely, he acidosis and hypercarbia improved.   Subjective: Seen and examined this morning while on dialysis.  Patient is awake but not quite alert.  She is oriented to self and follows commands.  Initially said tolerating in upper 90s on 4 L by nasal cannula.  She was able to maintain saturation in 90s on 1 L by nasal cannula during my encounter.  Objective: Vitals:   05/07/23 1530 05/07/23 1602 05/07/23 1607 05/07/23 1615  BP: (!) 97/57 125/69 129/73 129/73  Pulse: (!) 116 (!) 102 100 (!) 107  Resp: 20 (!) 27 18 (!) 21  Temp:    98.1 F (36.7 C)  TempSrc:      SpO2: 98% 93% 93% 93%  Weight:      Height:        Examination:  GENERAL: No apparent distress.  Nontoxic. HEENT: MMM.  Vision and hearing grossly intact.  NECK: Supple.  Difficult to assess JVD due to body habitus. RESP:  No IWOB.  Fair aeration bilaterally but clear to exam due to body habitus. CVS:  RRR. Heart sounds normal.  ABD/GI/GU: BS+. Abd soft, NTND.   MSK/EXT:  Moves extremities. No apparent deformity. No edema.  SKIN: no apparent skin lesion or wound NEURO: Awake but not quite alert.  Oriented to self.  Follows commands.  No focal neurodeficit.  No apparent focal neuro deficit. PSYCH: Calm. Normal affect.   Procedures:  Hemodialysis  Microbiology summarized: COVID-19 PCR negative.  Assessment and plan: Principal Problem:   Acute on chronic respiratory failure with hypercapnia (HCC) Active Problems:   HTN (hypertension)   DM (diabetes mellitus), type 2 with renal complications (HCC)   Chronic combined systolic and diastolic CHF (congestive heart failure) (HCC)   COPD exacerbation (HCC)   ESRD (end stage renal disease) on dialysis (HCC)   Lung cancer, lower lobe (HCC)  Acute on chronic respiratory failure with hypercapnia/chronic hypoxic respiratory failure: This is multifactorial including COPD exacerbation, underlying OSA and excessive oxygen use.  She seems to be on oxycodone for pain at home.  She presents with progressive shortness of breath and productive cough suggesting COPD exacerbation.  She was dawned on oxygen and became obtunded.  VBG showed respiratory acidosis with hypercapnia to 100 that has improved on subsequent VBG.  Currently awake but not quite alert.  She did not tolerate BiPAP. -Wean oxygen as able.  Minimum oxygen to keep saturation above 88%.  Discussed with patient's RN during HD. -Continue  systemic steroid, Zithromax, scheduled nebs and as needed nebs. -Minimize or avoid sedating medications -Reached VBG in the morning   Acute on chronic combined CHF/ESRD on HD TTS: Reportedly good compliance with HD.  CTA chest negative for PE but concerning for CHF.  Difficult to assess fluid status due to body habitus. -Fluid management by dialysis per nephrology  OSA not on CPAP -Minimum oxygen as above  IDDM-2 with hyperglycemia: A1c 7.1%. -Increase SSI to sensitive. -Continue nightly coverage. -Adjust as  appropriate.  Hypothyroidism -Continue home Synthroid  History of lung cancer s/p curative radiotherapy -Outpatient follow-up  Hyperkalemia: Received Lokelma in ED. -Per nephrology  Morbid obesity Body mass index is 38.97 kg/m.          DVT prophylaxis:  heparin injection 5,000 Units Start: 05/07/23 1400  Code Status: Full code Family Communication: None at bedside Level of care: Progressive Status is: Inpatient Remains inpatient appropriate because: Acute respiratory failure   Final disposition:  Consultants:  Nephrology  45 minutes with more than 50% spent in reviewing records, counseling patient/family and coordinating care.   Sch Meds:  Scheduled Meds:  allopurinol  100 mg Oral Daily   [START ON 05/08/2023] azithromycin  500 mg Oral Daily   carvedilol  6.25 mg Oral BID WC   Chlorhexidine Gluconate Cloth  6 each Topical Q0600   clopidogrel  75 mg Oral Daily   [START ON 05/09/2023] doxercalciferol  3 mcg Intravenous Q T,Th,Sa-HD   heparin  5,000 Units Subcutaneous Q8H   insulin aspart  0-5 Units Subcutaneous QHS   insulin aspart  0-6 Units Subcutaneous TID WC   ipratropium-albuterol  3 mL Nebulization TID   levothyroxine  175 mcg Oral Daily   methylPREDNISolone (SOLU-MEDROL) injection  125 mg Intravenous BID   Followed by   Melene Muller ON 05/08/2023] predniSONE  40 mg Oral Q breakfast   pantoprazole  40 mg Oral BID AC   sevelamer carbonate  1,600 mg Oral TID WC   sodium chloride flush  3 mL Intravenous Q12H   Continuous Infusions: PRN Meds:.acetaminophen **OR** acetaminophen, albuterol  Antimicrobials: Anti-infectives (From admission, onward)    Start     Dose/Rate Route Frequency Ordered Stop   05/08/23 0800  azithromycin (ZITHROMAX) tablet 500 mg       Placed in "Followed by" Linked Group   500 mg Oral Daily 05/07/23 0307 05/12/23 0959   05/07/23 0315  azithromycin (ZITHROMAX) 500 mg in sodium chloride 0.9 % 250 mL IVPB       Placed in "Followed by"  Linked Group   500 mg 250 mL/hr over 60 Minutes Intravenous Every 24 hours 05/07/23 0307 05/07/23 0341        I have personally reviewed the following labs and images: CBC: Recent Labs  Lab 05/06/23 2111 05/07/23 0050 05/07/23 0342 05/07/23 0619  WBC 10.3  --   --  13.4*  NEUTROABS 8.6*  --   --   --   HGB 10.2* 12.6 11.9* 10.5*  HCT 35.7* 37.0 35.0* 36.8  MCV 105.9*  --   --  105.7*  PLT 208  --   --  214   BMP &GFR Recent Labs  Lab 05/06/23 2111 05/07/23 0050 05/07/23 0342 05/07/23 0619  NA 133* 130* 128* 131*  K 5.3* 5.6* 5.9* 5.7*  CL 86*  --   --  90*  CO2 31  --   --  28  GLUCOSE 171*  --   --  206*  BUN 62*  --   --  64*  CREATININE 7.71*  --   --  7.58*  CALCIUM 9.5  --   --  9.0   Estimated Creatinine Clearance: 7.6 mL/min (A) (by C-G formula based on SCr of 7.58 mg/dL (H)). Liver & Pancreas: Recent Labs  Lab 05/06/23 2111  AST 23  ALT 14  ALKPHOS 126  BILITOT 0.6  PROT 6.8  ALBUMIN 3.3*   No results for input(s): "LIPASE", "AMYLASE" in the last 168 hours. No results for input(s): "AMMONIA" in the last 168 hours. Diabetic: No results for input(s): "HGBA1C" in the last 72 hours. Recent Labs  Lab 05/07/23 0812  GLUCAP 203*   Cardiac Enzymes: No results for input(s): "CKTOTAL", "CKMB", "CKMBINDEX", "TROPONINI" in the last 168 hours. No results for input(s): "PROBNP" in the last 8760 hours. Coagulation Profile: No results for input(s): "INR", "PROTIME" in the last 168 hours. Thyroid Function Tests: No results for input(s): "TSH", "T4TOTAL", "FREET4", "T3FREE", "THYROIDAB" in the last 72 hours. Lipid Profile: No results for input(s): "CHOL", "HDL", "LDLCALC", "TRIG", "CHOLHDL", "LDLDIRECT" in the last 72 hours. Anemia Panel: No results for input(s): "VITAMINB12", "FOLATE", "FERRITIN", "TIBC", "IRON", "RETICCTPCT" in the last 72 hours. Urine analysis:    Component Value Date/Time   COLORURINE YELLOW 11/27/2021 0956   APPEARANCEUR HAZY (A)  11/27/2021 0956   LABSPEC 1.012 11/27/2021 0956   PHURINE 5.0 11/27/2021 0956   GLUCOSEU NEGATIVE 11/27/2021 0956   HGBUR NEGATIVE 11/27/2021 0956   BILIRUBINUR NEGATIVE 11/27/2021 0956   KETONESUR NEGATIVE 11/27/2021 0956   PROTEINUR 30 (A) 11/27/2021 0956   UROBILINOGEN 0.2 08/04/2014 1409   NITRITE NEGATIVE 11/27/2021 0956   LEUKOCYTESUR NEGATIVE 11/27/2021 0956   Sepsis Labs: Invalid input(s): "PROCALCITONIN", "LACTICIDVEN"  Microbiology: Recent Results (from the past 240 hour(s))  SARS Coronavirus 2 by RT PCR (hospital order, performed in [facility] hospital lab) *cepheid single result test* Anterior Nasal Swab     Status: None   Collection Time: 05/06/23  7:38 PM   Specimen: Anterior Nasal Swab  Result Value Ref Range Status   SARS Coronavirus 2 by RT PCR NEGATIVE NEGATIVE Final    Comment: Performed at [facility], [address], [place], [place] [address]    Radiology Studies: CT Angio Chest PE W and/or Wo Contrast  Result Date: 05/06/2023 CLINICAL DATA:  Short of breath, chest pain EXAM: CT ANGIOGRAPHY CHEST WITH CONTRAST TECHNIQUE: Multidetector CT imaging of the chest was performed using the standard protocol during bolus administration of intravenous contrast. Multiplanar CT image reconstructions and MIPs were obtained to evaluate the vascular anatomy. RADIATION DOSE REDUCTION: This exam was performed according to the departmental dose-optimization program which includes automated exposure control, adjustment of the mA and/or kV according to patient size and/or use of iterative reconstruction technique. CONTRAST:  75mL OMNIPAQUE IOHEXOL 350 MG/ML SOLN COMPARISON:  05/06/2023, 11/09/2022 FINDINGS: Cardiovascular: This is a technically adequate evaluation of the pulmonary vasculature. No filling defects or pulmonary emboli. The heart is enlarged without pericardial effusion. No evidence of thoracic aortic aneurysm or dissection. Atherosclerosis of the aorta and coronary  vasculature. Mediastinum/Nodes: No enlarged mediastinal, hilar, or axillary lymph nodes. Thyroid gland, trachea, and esophagus demonstrate no significant findings. Lungs/Pleura: Left pleural effusion with dense left lower lobe consolidation consistent with compressive atelectasis. This accounts for the density seen at the left lung base on chest x-ray. Central vascular prominence. No airspace disease or pneumothorax. Dependent hypoventilatory changes right lower lobe. Central airways are patent. Upper Abdomen: No acute abnormality. Musculoskeletal: Multiple chronic healed bilateral  rib fractures. No acute bony abnormality. Reconstructed images demonstrate no additional findings. Review of the MIP images confirms the above findings. IMPRESSION: 1. No evidence of pulmonary embolus. 2. Cardiomegaly and central pulmonary vascular congestion without overt edema. 3. Left pleural effusion, with compressive left lower lobe atelectasis. 4. Aortic Atherosclerosis (ICD10-I70.0). Coronary artery atherosclerosis. Electronically Signed   By: Sharlet Salina M.D.   On: 05/06/2023 23:03   DG Chest Port 1 View  Result Date: 05/06/2023 CLINICAL DATA:  Short of breath EXAM: PORTABLE CHEST 1 VIEW COMPARISON:  04/13/2023 FINDINGS: Single frontal view of the chest demonstrates marked enlargement of the cardiac silhouette. Stable atherosclerosis of the aorta. Increased central vascular congestion, with increased interstitial prominence concerning for edema. Retrocardiac density may reflect left basilar consolidation or effusion. No pneumothorax. IMPRESSION: 1. Constellation of findings most consistent with congestive heart failure. 2. Increased density left lung base consistent with consolidation and effusion. Favor atelectasis over pneumonia. Electronically Signed   By: Sharlet Salina M.D.   On: 05/06/2023 20:50      Derrion Tritz T. Rudolpho Claxton [facility]  If 7PM-7AM, please contact night-coverage [email or web address] 05/07/2023, 5:15 PM

## 2023-05-07 NOTE — ED Notes (Signed)
Patient complaining of pain at IV site, no obvious infiltration noted but it is an USGIV. IV team consult placed.

## 2023-05-07 NOTE — Progress Notes (Signed)
Pt receives out-pt HD at [facility] on TTS. Pt has a 12:05 chair time. Will assist as needed.   Olivia Canter Renal Navigator [phone]

## 2023-05-08 DIAGNOSIS — J9622 Acute and chronic respiratory failure with hypercapnia: Secondary | ICD-10-CM | POA: Diagnosis not present

## 2023-05-08 DIAGNOSIS — J441 Chronic obstructive pulmonary disease with (acute) exacerbation: Secondary | ICD-10-CM | POA: Diagnosis not present

## 2023-05-08 DIAGNOSIS — G894 Chronic pain syndrome: Secondary | ICD-10-CM | POA: Diagnosis not present

## 2023-05-08 DIAGNOSIS — I5043 Acute on chronic combined systolic (congestive) and diastolic (congestive) heart failure: Secondary | ICD-10-CM | POA: Diagnosis not present

## 2023-05-08 LAB — GLUCOSE, CAPILLARY
Glucose-Capillary: 204 mg/dL — ABNORMAL HIGH (ref 70–99)
Glucose-Capillary: 183 mg/dL — ABNORMAL HIGH (ref 70–99)
Glucose-Capillary: 244 mg/dL — ABNORMAL HIGH (ref 70–99)
Glucose-Capillary: 318 mg/dL — ABNORMAL HIGH (ref 70–99)

## 2023-05-08 MED ORDER — BUDESONIDE 0.25 MG/2ML IN SUSP
0.2500 mg | Freq: Two times a day (BID) | RESPIRATORY_TRACT | Status: DC
  Administered 2023-05-08 – 2023-05-14 (×11): 0.25 mg via RESPIRATORY_TRACT
  Filled 2023-05-08 (×12): qty 2

## 2023-05-08 MED ORDER — ARFORMOTEROL TARTRATE 15 MCG/2ML IN NEBU
15.0000 ug | INHALATION_SOLUTION | Freq: Two times a day (BID) | RESPIRATORY_TRACT | Status: DC
  Administered 2023-05-08 – 2023-05-14 (×11): 15 ug via RESPIRATORY_TRACT
  Filled 2023-05-08 (×12): qty 2

## 2023-05-08 MED ORDER — REVEFENACIN 175 MCG/3ML IN SOLN
175.0000 ug | Freq: Every day | RESPIRATORY_TRACT | Status: DC
  Administered 2023-05-09 – 2023-05-14 (×5): 175 ug via RESPIRATORY_TRACT
  Filled 2023-05-08 (×6): qty 3

## 2023-05-08 NOTE — Progress Notes (Addendum)
PROGRESS NOTE        PATIENT DETAILS Name: [name] [name] Age: 72 y.o. Sex: female Date of Birth: [birth year] Admit Date: 05/06/2023 Admitting Physician Briscoe Deutscher, MD URK:YHCW, [name], FNP  Brief Summary: Patient is a 72 y.o.  female with history of COPD, non-small cell cancer-s/p curative radiation (completed April 06, 2022), ESRD on HD, HTN, DM-2, chronic combined systolic/diastolic heart failure, OSA with CPAP intolerance-presented with worsening shortness of breath-found to have COPD exacerbation with worsening hypercarbia-subsequently admitted to the hospitalist service.  See below for further details.  Significant events: 7/29>> admit to [facility]  Significant studies: 7/29>> CT angio chest: No PE.  Vascular congestion but no overt edema.  Significant microbiology data: 7/29>> COVID PCR: Negative  Procedures: None  Consults: Nephrology  Subjective: Denies any shortness of breath-slightly lethargic but easily arousable-answers questions appropriately-although slow.  Inquiring when she could go home.  Claims she is essentially bedbound and mostly uses a wheelchair.  No family at bedside.  Objective: Vitals: Blood pressure 126/76, pulse (!) 108, temperature 98 F (36.7 C), temperature source Oral, resp. rate 20, height 5\' 3"  (1.6 m), weight 102.2 kg, SpO2 94%.   Exam: Gen Exam:not in any distress HEENT:atraumatic, normocephalic Chest: B/L clear to auscultation anteriorly CVS:S1S2 regular Abdomen:soft non tender, non distended Extremities:no edema Neurology: Generalized weakness-but moves all 4 extremities slowly. Skin: no rash  Pertinent Labs/Radiology:    Latest Ref Rng & Units 05/08/2023   12:57 AM 05/07/2023    6:19 AM 05/07/2023    3:42 AM  CBC  WBC 4.0 - 10.5 K/uL 12.0  13.4    Hemoglobin 12.0 - 15.0 g/dL 23.7  62.8  31.5   Hematocrit 36.0 - 46.0 % 34.4  36.8  35.0   Platelets 150 - 400 K/uL 230  214      Lab Results   Component Value Date   NA 132 (L) 05/08/2023   K 4.0 05/08/2023   CL 90 (L) 05/08/2023   CO2 28 05/08/2023      Assessment/Plan: COPD exacerbation Acute on chronic hypoxic/hypercarbic respiratory failure (on home O2-2-3 L) Improved-moving air well-awake/alert-apparently did not tolerate BiPAP on admission Slowly taper steroids Continue bronchodilators Minimize sedating medications as much as possible  Acute on chronic combined diastolic/systolic heart failure in the setting of ESRD Overall improved after HD on 7/30 Volume removal with HD  ESRD on HD TTS Nephrology following  OSA Intolerant to CPAP On home oxygen  History of lung cancer-s/p curative radiation Resume outpatient follow-up with radiation/medical oncology  History of PAD Plavix  Hypothyroidism Synthroid  DM-2 (A1c 7.1 on 7/6) CBGs stable on SSI.  Recent Labs    05/07/23 2141 05/08/23 0748 05/08/23 1155  GLUCAP 156* 204* 183*    Debility/deconditioning Worsened by acute illness Per patient-she is minimally ambulatory and mostly bed to wheelchair bound PT/OT eval.  Obesity: Estimated body mass index is 39.91 kg/m as calculated from the following:   Height as of this encounter: 5\' 3"  (1.6 m).   Weight as of this encounter: 102.2 kg.   Code status:   Code Status: Full Code   DVT Prophylaxis: heparin injection 5,000 Units Start: 05/07/23 1400   Family Communication: Son at bedside   Disposition Plan: Status is: Inpatient Remains inpatient appropriate because: Severity of illness    Planned Discharge Destination:Skilled nursing facility  Diet: Diet Order             Diet renal with fluid restriction Fluid restriction: 1200 mL Fluid; Room service appropriate? Yes; Fluid consistency: Thin  Diet effective now                     Antimicrobial agents: Anti-infectives (From admission, onward)    Start     Dose/Rate Route Frequency Ordered Stop   05/08/23 0800  azithromycin  (ZITHROMAX) tablet 500 mg       Placed in "Followed by" Linked Group   500 mg Oral Daily 05/07/23 0307 05/12/23 0959   05/07/23 0315  azithromycin (ZITHROMAX) 500 mg in sodium chloride 0.9 % 250 mL IVPB       Placed in "Followed by" Linked Group   500 mg 250 mL/hr over 60 Minutes Intravenous Every 24 hours 05/07/23 0307 05/07/23 0341        MEDICATIONS: Scheduled Meds:  allopurinol  100 mg Oral Daily   azithromycin  500 mg Oral Daily   carvedilol  6.25 mg Oral BID WC   Chlorhexidine Gluconate Cloth  6 each Topical Q0600   clopidogrel  75 mg Oral Daily   [START ON 05/09/2023] doxercalciferol  3 mcg Intravenous Q T,Th,Sa-HD   heparin  5,000 Units Subcutaneous Q8H   insulin aspart  0-5 Units Subcutaneous QHS   insulin aspart  0-9 Units Subcutaneous TID WC   ipratropium-albuterol  3 mL Nebulization TID   levothyroxine  175 mcg Oral Daily   pantoprazole  40 mg Oral BID AC   predniSONE  40 mg Oral Q breakfast   sevelamer carbonate  1,600 mg Oral TID WC   sodium chloride flush  3 mL Intravenous Q12H   Continuous Infusions: PRN Meds:.acetaminophen **OR** acetaminophen, albuterol   I have personally reviewed following labs and imaging studies  LABORATORY DATA: CBC: Recent Labs  Lab 05/06/23 2111 05/07/23 0050 05/07/23 0342 05/07/23 0619 05/08/23 0057  WBC 10.3  --   --  13.4* 12.0*  NEUTROABS 8.6*  --   --   --   --   HGB 10.2* 12.6 11.9* 10.5* 10.0*  HCT 35.7* 37.0 35.0* 36.8 34.4*  MCV 105.9*  --   --  105.7* 102.7*  PLT 208  --   --  214 230    Basic Metabolic Panel: Recent Labs  Lab 05/06/23 2111 05/07/23 0050 05/07/23 0342 05/07/23 0619 05/07/23 2109 05/08/23 0057 05/08/23 0058  NA 133* 130* 128* 131* 132*  --  132*  K 5.3* 5.6* 5.9* 5.7* 3.8  --  4.0  CL 86*  --   --  90* 90*  --  90*  CO2 31  --   --  28 25  --  28  GLUCOSE 171*  --   --  206* 177*  --  174*  BUN 62*  --   --  64* 37*  --  40*  CREATININE 7.71*  --   --  7.58* 4.99*  --  5.11*   CALCIUM 9.5  --   --  9.0 8.9  --  8.9  MG  --   --   --   --  1.7 2.1  --   PHOS  --   --   --   --   --   --  5.2*    GFR: Estimated Creatinine Clearance: 11.4 mL/min (A) (by C-G formula based on SCr of 5.11 mg/dL (H)).  Liver Function Tests:  Recent Labs  Lab 05/06/23 2111 05/08/23 0058  AST 23  --   ALT 14  --   ALKPHOS 126  --   BILITOT 0.6  --   PROT 6.8  --   ALBUMIN 3.3* 3.2*   No results for input(s): "LIPASE", "AMYLASE" in the last 168 hours. No results for input(s): "AMMONIA" in the last 168 hours.  Coagulation Profile: No results for input(s): "INR", "PROTIME" in the last 168 hours.  Cardiac Enzymes: No results for input(s): "CKTOTAL", "CKMB", "CKMBINDEX", "TROPONINI" in the last 168 hours.  BNP (last 3 results) No results for input(s): "PROBNP" in the last 8760 hours.  Lipid Profile: No results for input(s): "CHOL", "HDL", "LDLCALC", "TRIG", "CHOLHDL", "LDLDIRECT" in the last 72 hours.  Thyroid Function Tests: No results for input(s): "TSH", "T4TOTAL", "FREET4", "T3FREE", "THYROIDAB" in the last 72 hours.  Anemia Panel: No results for input(s): "VITAMINB12", "FOLATE", "FERRITIN", "TIBC", "IRON", "RETICCTPCT" in the last 72 hours.  Urine analysis:    Component Value Date/Time   COLORURINE YELLOW 11/27/2021 0956   APPEARANCEUR HAZY (A) 11/27/2021 0956   LABSPEC 1.012 11/27/2021 0956   PHURINE 5.0 11/27/2021 0956   GLUCOSEU NEGATIVE 11/27/2021 0956   HGBUR NEGATIVE 11/27/2021 0956   BILIRUBINUR NEGATIVE 11/27/2021 0956   KETONESUR NEGATIVE 11/27/2021 0956   PROTEINUR 30 (A) 11/27/2021 0956   UROBILINOGEN 0.2 08/04/2014 1409   NITRITE NEGATIVE 11/27/2021 0956   LEUKOCYTESUR NEGATIVE 11/27/2021 0956    Sepsis Labs: Lactic Acid, Venous    Component Value Date/Time   LATICACIDVEN 0.6 05/07/2023 0258    MICROBIOLOGY: Recent Results (from the past 240 hour(s))  SARS Coronavirus 2 by RT PCR (hospital order, performed in [facility] hospital lab)  *cepheid single result test* Anterior Nasal Swab     Status: None   Collection Time: 05/06/23  7:38 PM   Specimen: Anterior Nasal Swab  Result Value Ref Range Status   SARS Coronavirus 2 by RT PCR NEGATIVE NEGATIVE Final    Comment: Performed at [facility], [address] [place], [place] [address]    RADIOLOGY STUDIES/RESULTS: CT Angio Chest PE W and/or Wo Contrast  Result Date: 05/06/2023 CLINICAL DATA:  Short of breath, chest pain EXAM: CT ANGIOGRAPHY CHEST WITH CONTRAST TECHNIQUE: Multidetector CT imaging of the chest was performed using the standard protocol during bolus administration of intravenous contrast. Multiplanar CT image reconstructions and MIPs were obtained to evaluate the vascular anatomy. RADIATION DOSE REDUCTION: This exam was performed according to the departmental dose-optimization program which includes automated exposure control, adjustment of the mA and/or kV according to patient size and/or use of iterative reconstruction technique. CONTRAST:  75mL OMNIPAQUE IOHEXOL 350 MG/ML SOLN COMPARISON:  05/06/2023, 11/09/2022 FINDINGS: Cardiovascular: This is a technically adequate evaluation of the pulmonary vasculature. No filling defects or pulmonary emboli. The heart is enlarged without pericardial effusion. No evidence of thoracic aortic aneurysm or dissection. Atherosclerosis of the aorta and coronary vasculature. Mediastinum/Nodes: No enlarged mediastinal, hilar, or axillary lymph nodes. Thyroid gland, trachea, and esophagus demonstrate no significant findings. Lungs/Pleura: Left pleural effusion with dense left lower lobe consolidation consistent with compressive atelectasis. This accounts for the density seen at the left lung base on chest x-ray. Central vascular prominence. No airspace disease or pneumothorax. Dependent hypoventilatory changes right lower lobe. Central airways are patent. Upper Abdomen: No acute abnormality. Musculoskeletal: Multiple chronic healed  bilateral rib fractures. No acute bony abnormality. Reconstructed images demonstrate no additional findings. Review of the MIP images confirms the above findings.  IMPRESSION: 1. No evidence of pulmonary embolus. 2. Cardiomegaly and central pulmonary vascular congestion without overt edema. 3. Left pleural effusion, with compressive left lower lobe atelectasis. 4. Aortic Atherosclerosis (ICD10-I70.0). Coronary artery atherosclerosis. Electronically Signed   By: Sharlet Salina M.D.   On: 05/06/2023 23:03   DG Chest Port 1 View  Result Date: 05/06/2023 CLINICAL DATA:  Short of breath EXAM: PORTABLE CHEST 1 VIEW COMPARISON:  04/13/2023 FINDINGS: Single frontal view of the chest demonstrates marked enlargement of the cardiac silhouette. Stable atherosclerosis of the aorta. Increased central vascular congestion, with increased interstitial prominence concerning for edema. Retrocardiac density may reflect left basilar consolidation or effusion. No pneumothorax. IMPRESSION: 1. Constellation of findings most consistent with congestive heart failure. 2. Increased density left lung base consistent with consolidation and effusion. Favor atelectasis over pneumonia. Electronically Signed   By: Sharlet Salina M.D.   On: 05/06/2023 20:50     LOS: 1 day   Jeoffrey Massed, MD  [facility]    To contact the attending provider between 7A-7P or the covering provider during after hours 7P-7A, please log into the web site [email or web address] and access using universal [place] password for that web site. If you do not have the password, please call the hospital operator.  05/08/2023, 11:48 AM

## 2023-05-08 NOTE — Progress Notes (Signed)
Subjective: Seen and examined in rm appearing more alert than yest . No Sob, tolerated HD yest on schedule 3.7 L uf   Objective Vital signs in last 24 hours: Vitals:   05/08/23 0130 05/08/23 0400 05/08/23 0746 05/08/23 0748  BP:  (!) 109/52 126/76   Pulse: (!) 115 94 (!) 109 (!) 103  Resp: (!) 22 18 20  (!) 25  Temp:  98.6 F (37 C) 98 F (36.7 C)   TempSrc:  Oral Oral   SpO2: 94% 93% 94% 94%  Weight: 102.2 kg     Height:       Weight change: 2.409 kg  Physical Exam: General: obese , Alert  elderly female chronically ill appearing  NAD, oriented to [facility] , Wed   Heart: RRR , no MRG Lungs: BiLat  exp wheezing  ,non labored on [place] o2  4L  O2 sat 97% Abdomen: Obese, NABS , Soft , NT  abd wall edema  Extremities: trace bipedal edema    Dialysis Access: LUA AVG + Bruit     OP Dialysis Orders:  [facility] , TTS 4 hours , EDW 100.8 kg  2K 2 Ca bath, LUA AVG heparin 2000 units  Mircera 50 mcg every 2 weeks last dose 05/04/23  venofer 50 mg q wkly , Hectorol 3 mcg q. 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 3.7 LUF  on HD  yest. Tomor  uf as tolerated 2- 2.5 L ESRD -  Hd  TTS , HD tomor on schedule Hypertension/volume  - BP  ok , vol as abv  Anemia  - hgb  10.0 ( just has esa 7/27)  Metabolic bone disease -  Ca corec  ok, continue Hectorol next hd  thurs/ phos 5.2 Phos binders  with diet   DM  Type 2 = per admit team    Lenny Pastel, PA-C [facility] Beeper [phone] 05/08/2023,10:07 AM  LOS: 1 day   Labs: Basic Metabolic Panel: Recent Labs  Lab 05/07/23 0619 05/07/23 2109 05/08/23 0058  NA 131* 132* 132*  K 5.7* 3.8 4.0  CL 90* 90* 90*  CO2 28 25 28   GLUCOSE 206* 177* 174*  BUN 64* 37* 40*  CREATININE 7.58* 4.99* 5.11*  CALCIUM 9.0 8.9 8.9  PHOS  --   --  5.2*   Liver Function Tests: Recent Labs  Lab 05/06/23 2111  05/08/23 0058  AST 23  --   ALT 14  --   ALKPHOS 126  --   BILITOT 0.6  --   PROT 6.8  --   ALBUMIN 3.3* 3.2*   No results for input(s): "LIPASE", "AMYLASE" in the last 168 hours. No results for input(s): "AMMONIA" in the last 168 hours. CBC: Recent Labs  Lab 05/06/23 2111 05/07/23 0050 05/07/23 0342 05/07/23 0619 05/08/23 0057  WBC 10.3  --   --  13.4* 12.0*  NEUTROABS 8.6*  --   --   --   --   HGB 10.2*   < > 11.9* 10.5* 10.0*  HCT 35.7*   < > 35.0* 36.8 34.4*  MCV 105.9*  --   --  105.7* 102.7*  PLT 208  --   --  214 230   < > = values in this interval not displayed.   Cardiac Enzymes:  No results for input(s): "CKTOTAL", "CKMB", "CKMBINDEX", "TROPONINI" in the last 168 hours. CBG: Recent Labs  Lab 05/07/23 0812 05/07/23 1806 05/07/23 2141 05/08/23 0748  GLUCAP 203* 166* 156* 204*    Studies/Results: CT Angio Chest PE W and/or Wo Contrast  Result Date: 05/06/2023 CLINICAL DATA:  Short of breath, chest pain EXAM: CT ANGIOGRAPHY CHEST WITH CONTRAST TECHNIQUE: Multidetector CT imaging of the chest was performed using the standard protocol during bolus administration of intravenous contrast. Multiplanar CT image reconstructions and MIPs were obtained to evaluate the vascular anatomy. RADIATION DOSE REDUCTION: This exam was performed according to the departmental dose-optimization program which includes automated exposure control, adjustment of the mA and/or kV according to patient size and/or use of iterative reconstruction technique. CONTRAST:  75mL OMNIPAQUE IOHEXOL 350 MG/ML SOLN COMPARISON:  05/06/2023, 11/09/2022 FINDINGS: Cardiovascular: This is a technically adequate evaluation of the pulmonary vasculature. No filling defects or pulmonary emboli. The heart is enlarged without pericardial effusion. No evidence of thoracic aortic aneurysm or dissection. Atherosclerosis of the aorta and coronary vasculature. Mediastinum/Nodes: No enlarged mediastinal, hilar, or axillary  lymph nodes. Thyroid gland, trachea, and esophagus demonstrate no significant findings. Lungs/Pleura: Left pleural effusion with dense left lower lobe consolidation consistent with compressive atelectasis. This accounts for the density seen at the left lung base on chest x-ray. Central vascular prominence. No airspace disease or pneumothorax. Dependent hypoventilatory changes right lower lobe. Central airways are patent. Upper Abdomen: No acute abnormality. Musculoskeletal: Multiple chronic healed bilateral rib fractures. No acute bony abnormality. Reconstructed images demonstrate no additional findings. Review of the MIP images confirms the above findings. IMPRESSION: 1. No evidence of pulmonary embolus. 2. Cardiomegaly and central pulmonary vascular congestion without overt edema. 3. Left pleural effusion, with compressive left lower lobe atelectasis. 4. Aortic Atherosclerosis (ICD10-I70.0). Coronary artery atherosclerosis. Electronically Signed   By: Sharlet Salina M.D.   On: 05/06/2023 23:03   DG Chest Port 1 View  Result Date: 05/06/2023 CLINICAL DATA:  Short of breath EXAM: PORTABLE CHEST 1 VIEW COMPARISON:  04/13/2023 FINDINGS: Single frontal view of the chest demonstrates marked enlargement of the cardiac silhouette. Stable atherosclerosis of the aorta. Increased central vascular congestion, with increased interstitial prominence concerning for edema. Retrocardiac density may reflect left basilar consolidation or effusion. No pneumothorax. IMPRESSION: 1. Constellation of findings most consistent with congestive heart failure. 2. Increased density left lung base consistent with consolidation and effusion. Favor atelectasis over pneumonia. Electronically Signed   By: Sharlet Salina M.D.   On: 05/06/2023 20:50   Medications:   allopurinol  100 mg Oral Daily   azithromycin  500 mg Oral Daily   carvedilol  6.25 mg Oral BID WC   Chlorhexidine Gluconate Cloth  6 each Topical Q0600   clopidogrel  75 mg  Oral Daily   [START ON 05/09/2023] doxercalciferol  3 mcg Intravenous Q T,Th,Sa-HD   heparin  5,000 Units Subcutaneous Q8H   insulin aspart  0-5 Units Subcutaneous QHS   insulin aspart  0-9 Units Subcutaneous TID WC   ipratropium-albuterol  3 mL Nebulization TID   levothyroxine  175 mcg Oral Daily   pantoprazole  40 mg Oral BID AC   predniSONE  40 mg Oral Q breakfast   sevelamer carbonate  1,600 mg Oral TID WC   sodium chloride flush  3 mL Intravenous Q12H

## 2023-05-08 NOTE — Plan of Care (Signed)
°  Problem: Education: Goal: Knowledge of disease or condition will improve Outcome: Progressing   Problem: Activity: Goal: Ability to tolerate increased activity will improve Outcome: Progressing Goal: Will verbalize the importance of balancing activity with adequate rest periods Outcome: Progressing   Problem: Respiratory: Goal: Ability to maintain a clear airway will improve Outcome: Progressing Goal: Levels of oxygenation will improve Outcome: Progressing

## 2023-05-08 NOTE — TOC Initial Note (Signed)
Transition of Care [facility]) - Initial/Assessment Note    Patient Details  Name: [name] [name] MRN: [id number] Date of Birth: 04/04/1951  Transition of Care [facility]) CM/SW Contact:    Mearl Latin, LCSW Phone Number: 05/08/2023, 12:18 PM  Clinical Narrative:                 Patient admitted from [facility] where she has been for rehab. She will require new insurance approval to return. Therapy ordered.  Expected Discharge Plan: Skilled Nursing Facility Barriers to Discharge: Continued Medical Work up, English as a second language teacher   Patient Goals and CMS Choice Patient states their goals for this hospitalization and ongoing recovery are:: Rehab          Expected Discharge Plan and Services In-house Referral: Clinical Social Work     Living arrangements for the past 2 months: Apartment, Skilled Nursing Facility                                      Prior Living Arrangements/Services Living arrangements for the past 2 months: Apartment, Skilled Nursing Facility   Patient language and need for interpreter reviewed:: Yes        Need for Family Participation in Patient Care: Yes (Comment) Care giver support system in place?: Yes (comment)   Criminal Activity/Legal Involvement Pertinent to Current Situation/Hospitalization: No - Comment as needed  Activities of Daily Living      Permission Sought/Granted                  Emotional Assessment Appearance:: Appears stated age Attitude/Demeanor/Rapport: Unable to Assess Affect (typically observed): Unable to Assess Orientation: : Oriented to Self, Oriented to Place Alcohol / Substance Use: Not Applicable Psych Involvement: No (comment)  Admission diagnosis:  Shortness of breath [R06.02] Atypical chest pain [R07.89] Hypoxia [R09.02] ESRD (end stage renal disease) (HCC) [N18.6] Chronic respiratory failure with hypoxia (HCC) [J96.11] Acute on chronic respiratory failure with hypoxia and hypercapnia (HCC) [M57.84,  J96.22] Patient Active Problem List   Diagnosis Date Noted   Acute respiratory failure (HCC) 08/07/2022   Respiratory failure, post-operative (HCC) 08/07/2022   Pulmonary edema 08/07/2022   Acute on chronic respiratory failure with hypercapnia (HCC) 08/07/2022   Lung mass 07/30/2022   Lung cancer, lower lobe (HCC) 01/31/2022   Pressure injury of skin 12/06/2021   Malnutrition of moderate degree 12/01/2021   ESRD (end stage renal disease) on dialysis [facility])    Lung nodules    Advanced care planning/counseling discussion    Goals of care, counseling/discussion    Acute respiratory failure with hypoxia and hypercarbia (HCC)    Labored breathing    Kidney lesion, native, left 11/24/2021   Syncope 11/21/2021   Chronic pain syndrome 07/01/2021   COPD exacerbation (HCC) 07/01/2021   Cellulitis of leg, right 07/01/2021   SARS-CoV-2 positive 02/21/2021   Increased weakness when ambulating 02/17/2021   Intractable nausea and vomiting 02/15/2021   Symptomatic anemia 01/24/2021   Anemia, unspecified 01/08/2020   Bacteremia 01/07/2020   UTI (urinary tract infection) 01/06/2020   Anemia 10/26/2019   S/P lumbar fusion 09/18/2019   Acute on chronic combined systolic and diastolic CHF (congestive heart failure) (HCC) 03/16/2019   Leukocytosis 03/16/2019   Chest pain 11/27/2018   Pre-syncope 11/27/2018   Epigastric abdominal pain 11/27/2018   Solitary pulmonary nodule 11/06/2018   Hypertensive heart and renal disease, stage 1-4 or unspecified chronic kidney disease,  with heart failure (HCC) 10/20/2018   CHF (congestive heart failure), NYHA class III, acute on chronic, systolic (HCC) 10/20/2018   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, unspecified extremity (HCC) 04/19/2016   Cellulitis of abdominal wall 03/21/2015   Acute on chronic renal failure (HCC) 03/21/2015   DM (diabetes mellitus), type 2 with renal complications (HCC) 03/21/2015   Abdominal wall abscess  03/21/2015   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   Low urine output 08/11/2014   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   Hypothyroidism 08/11/2014   OSA (obstructive sleep apnea) 08/11/2014   Positive cardiac stress test 06/29/2014   Hyperlipidemia 02/12/2014   Left arm weakness 12/23/2013   TIA (transient ischemic attack) 12/23/2013   Left buttock abscess 10/12/2013   Cellulitis and abscess of leg, right 04/16/2013   Acute blood loss anemia 02/14/2013   HTN (hypertension) 02/14/2013   Cellulitis and abscess of buttock 02/09/2013   Class 2 obesity due to excess calories with body mass index (BMI) of 36.0 to 36.9 in adult 02/03/2013   Diarrhea 02/03/2013   Hyponatremia 02/03/2013   Sepsis due to undetermined organism (HCC) 02/02/2013   Diabetes mellitus without complication (HCC) 02/02/2013   CKD (chronic kidney disease), stage IV (HCC) 02/02/2013   Cellulitis 02/02/2013   PAD (peripheral artery disease) (HCC) 04/26/2011   Tobacco abuse 04/26/2011   PCP:  Vladimir Crofts, FNP Pharmacy:   [facility] [phone] - [place], [place] - [address] AT [facility] OF [address] [place] [address] Phone: [phone] Fax: [phone]     Social Determinants of Health (SDOH) Social History: SDOH Screenings   Food Insecurity: No Food Insecurity (04/13/2023)  Housing: Medium Risk (04/13/2023)  Transportation Needs: No Transportation Needs (04/13/2023)  Utilities: Not At Risk (04/13/2023)  Tobacco Use: Medium Risk (05/07/2023)   SDOH Interventions:     Readmission Risk Interventions    05/08/2023   12:17 PM  Readmission Risk Prevention Plan  Transportation Screening Complete  Medication Review (RN Care Manager) Complete  PCP or Specialist appointment within 3-5 days of discharge Complete  HRI or Home Care Consult Complete  SW Recovery Care/Counseling Consult Complete  Palliative Care Screening Not Applicable  Skilled Nursing Facility  Complete

## 2023-05-08 NOTE — Progress Notes (Signed)
Lab called for critical result. Venous blood gas: PCO2: 78. D. Crosley,MD made aware

## 2023-05-08 NOTE — Plan of Care (Signed)
Patient feels better today per patient

## 2023-05-08 NOTE — Progress Notes (Signed)
Tele called that pt has 10 beats of NS Vtach run. Patient said has no complaints until asked if she's having some chest pain (9/10) and she said yes. I am in doubt since he is quite confused at times and and doesn't seems she's in pain. Latest VS: T:65F, BP: 115/56(73), HR: 105, RR: 22, O2 sat: 92% at 3lpm. EKG done. Perrin Maltese, MD made aware, ordered BMP and Mg stat.

## 2023-05-09 ENCOUNTER — Inpatient Hospital Stay: Payer: No Typology Code available for payment source

## 2023-05-09 DIAGNOSIS — G894 Chronic pain syndrome: Secondary | ICD-10-CM | POA: Diagnosis not present

## 2023-05-09 DIAGNOSIS — J9622 Acute and chronic respiratory failure with hypercapnia: Secondary | ICD-10-CM | POA: Diagnosis not present

## 2023-05-09 DIAGNOSIS — I5043 Acute on chronic combined systolic (congestive) and diastolic (congestive) heart failure: Secondary | ICD-10-CM | POA: Diagnosis not present

## 2023-05-09 DIAGNOSIS — J441 Chronic obstructive pulmonary disease with (acute) exacerbation: Secondary | ICD-10-CM | POA: Diagnosis not present

## 2023-05-09 LAB — GLUCOSE, CAPILLARY
Glucose-Capillary: 119 mg/dL — ABNORMAL HIGH (ref 70–99)
Glucose-Capillary: 240 mg/dL — ABNORMAL HIGH (ref 70–99)
Glucose-Capillary: 272 mg/dL — ABNORMAL HIGH (ref 70–99)

## 2023-05-09 MED ORDER — PREDNISONE 5 MG PO TABS
30.0000 mg | ORAL_TABLET | Freq: Every day | ORAL | Status: AC
  Administered 2023-05-10 – 2023-05-11 (×2): 30 mg via ORAL
  Filled 2023-05-09 (×2): qty 2

## 2023-05-09 MED ORDER — HEPARIN SODIUM (PORCINE) 1000 UNIT/ML IJ SOLN
INTRAMUSCULAR | Status: AC
  Administered 2023-05-09: 2000 [IU]
  Filled 2023-05-09: qty 2

## 2023-05-09 NOTE — Inpatient Diabetes Management (Signed)
Inpatient Diabetes Program Recommendations  AACE/ADA: New Consensus Statement on Inpatient Glycemic Control   Target Ranges:  Prepandial:   less than 140 mg/dL      Peak postprandial:   less than 180 mg/dL (1-2 hours)      Critically ill patients:  140 - 180 mg/dL    Latest Reference Range & Units 05/08/23 07:48 05/08/23 11:55 05/08/23 16:19 05/08/23 20:43 05/09/23 07:24  Glucose-Capillary 70 - 99 mg/dL 253 (H) 664 (H) 403 (H) 318 (H) 119 (H)   Review of Glycemic Control  Diabetes history: DM2 Outpatient Diabetes medications: Novolog 0-12 units TID with meals Current orders for Inpatient glycemic control: Novolog 0-9 units TID with meals, Novolog 0--5 units at bedtime; Prednisone 40 mg QAM  Inpatient Diabetes Program Recommendations:    Insulin: If steroids are continued and post prandial glucose remains consistently over 180 mg/dl, please consider ordering Novolog 3 units TID with meals for meal coverage if patient eats at least 50% of meals.  Thanks, Orlando Penner, RN, MSN, CDCES Diabetes Coordinator Inpatient Diabetes Program [phone] (Team Pager from 8am to 5pm)

## 2023-05-09 NOTE — Progress Notes (Addendum)
PROGRESS NOTE        PATIENT DETAILS Name: [name] [name] Age: 72 y.o. Sex: female Date of Birth: [birth year] Admit Date: 05/06/2023 Admitting Physician Briscoe Deutscher, MD VHQ:IONG, [name], FNP  Brief Summary: Patient is a 72 y.o.  female with history of COPD, non-small cell cancer-s/p curative radiation (completed April 06, 2022), ESRD on HD, HTN, DM-2, chronic combined systolic/diastolic heart failure, OSA with CPAP intolerance-presented with worsening shortness of breath-found to have COPD exacerbation with worsening hypercarbia-subsequently admitted to the hospitalist service.  See below for further details.  Significant events: 7/29>> admit to [facility]  Significant studies: 7/29>> CT angio chest: No PE.  Vascular congestion but no overt edema.  Significant microbiology data: 7/29>> COVID PCR: Negative  Procedures: None  Consults: Nephrology  Subjective: No major issues overnight-inquiring when she can go home.  No complaints-appears comfortable-stable on 2-3 L of oxygen this morning.  Objective: Vitals: Blood pressure (!) 134/56, pulse 88, temperature 98.2 F (36.8 C), resp. rate (!) 21, height 5\' 3"  (1.6 m), weight (S) 99.7 kg, SpO2 96%.   Exam: Gen Exam:Alert awake-not in any distress HEENT:atraumatic, normocephalic Chest: B/L clear to auscultation anteriorly CVS:S1S2 regular Abdomen:soft non tender, non distended Extremities:no edema Neurology: Non focal-has chronic lower extremity weakness-at baseline. Skin: no rash  Pertinent Labs/Radiology:    Latest Ref Rng & Units 05/09/2023    5:27 AM 05/08/2023   12:57 AM 05/07/2023    6:19 AM  CBC  WBC 4.0 - 10.5 K/uL 7.9  12.0  13.4   Hemoglobin 12.0 - 15.0 g/dL 9.9  29.5  28.4   Hematocrit 36.0 - 46.0 % 33.7  34.4  36.8   Platelets 150 - 400 K/uL 269  230  214     Lab Results  Component Value Date   NA 130 (L) 05/09/2023   K 4.3 05/09/2023   CL 88 (L) 05/09/2023   CO2 25 05/09/2023       Assessment/Plan: COPD exacerbation Acute on chronic hypoxic/hypercarbic respiratory failure (on home O2-2-3 L) Overall improved  Down to 2 L of oxygen which is at her baseline Taper steroids further Continue bronchodilators Minimize sedating medications as much as possible.  Acute on chronic combined diastolic/systolic heart failure in the setting of ESRD Significant improved after HD on 7/30-scheduled for dialysis 8/1.  Overall improved after HD on 7/30  ESRD on HD TTS Nephrology following  OSA Intolerant to CPAP On home oxygen  History of lung cancer-s/p curative radiation Resume outpatient follow-up with radiation/medical oncology  History of PAD Plavix  Hypothyroidism Synthroid  Uncontrolled DM-2-with steroid-induced hyperglycemia (A1c 7.1 on 7/6) CBGs on the higher side this morning-prednisone dosage decreased further-reassess 8/2.  Recent Labs    05/08/23 1619 05/08/23 2043 05/09/23 0724  GLUCAP 244* 318* 119*    Debility/deconditioning Worsened by acute illness Per patient-she is minimally ambulatory and mostly bed to wheelchair bound PT/OT eval.-recommended-social work following.  Obesity: Estimated body mass index is 38.94 kg/m as calculated from the following:   Height as of this encounter: 5\' 3"  (1.6 m).   Weight as of this encounter: 99.7 kg.   Code status:   Code Status: Full Code   DVT Prophylaxis: heparin injection 5,000 Units Start: 05/07/23 1400   Family Communication: Son at bedside on 7/31-no one at bedside this morning.   Disposition Plan: Status is: Inpatient  Remains inpatient appropriate because: Severity of illness    Planned Discharge Destination:Skilled nursing facility   Diet: Diet Order             Diet renal with fluid restriction Fluid restriction: 1200 mL Fluid; Room service appropriate? Yes; Fluid consistency: Thin  Diet effective now                     Antimicrobial agents: Anti-infectives (From  admission, onward)    Start     Dose/Rate Route Frequency Ordered Stop   05/08/23 0800  azithromycin (ZITHROMAX) tablet 500 mg       Placed in "Followed by" Linked Group   500 mg Oral Daily 05/07/23 0307 05/12/23 0959   05/07/23 0315  azithromycin (ZITHROMAX) 500 mg in sodium chloride 0.9 % 250 mL IVPB       Placed in "Followed by" Linked Group   500 mg 250 mL/hr over 60 Minutes Intravenous Every 24 hours 05/07/23 0307 05/07/23 0341        MEDICATIONS: Scheduled Meds:  allopurinol  100 mg Oral Daily   arformoterol  15 mcg Nebulization BID   azithromycin  500 mg Oral Daily   budesonide (PULMICORT) nebulizer solution  0.25 mg Nebulization BID   carvedilol  6.25 mg Oral BID WC   Chlorhexidine Gluconate Cloth  6 each Topical Q0600   clopidogrel  75 mg Oral Daily   doxercalciferol  3 mcg Intravenous Q T,Th,Sa-HD   heparin  5,000 Units Subcutaneous Q8H   insulin aspart  0-5 Units Subcutaneous QHS   insulin aspart  0-9 Units Subcutaneous TID WC   levothyroxine  175 mcg Oral Daily   pantoprazole  40 mg Oral BID AC   predniSONE  40 mg Oral Q breakfast   revefenacin  175 mcg Nebulization Daily   sevelamer carbonate  1,600 mg Oral TID WC   sodium chloride flush  3 mL Intravenous Q12H   Continuous Infusions: PRN Meds:.acetaminophen **OR** acetaminophen, albuterol   I have personally reviewed following labs and imaging studies  LABORATORY DATA: CBC: Recent Labs  Lab 05/06/23 2111 05/07/23 0050 05/07/23 0342 05/07/23 0619 05/08/23 0057 05/09/23 0527  WBC 10.3  --   --  13.4* 12.0* 7.9  NEUTROABS 8.6*  --   --   --   --   --   HGB 10.2* 12.6 11.9* 10.5* 10.0* 9.9*  HCT 35.7* 37.0 35.0* 36.8 34.4* 33.7*  MCV 105.9*  --   --  105.7* 102.7* 103.1*  PLT 208  --   --  214 230 269    Basic Metabolic Panel: Recent Labs  Lab 05/06/23 2111 05/07/23 0050 05/07/23 0342 05/07/23 0619 05/07/23 2109 05/08/23 0057 05/08/23 0058 05/09/23 0527  NA 133*   < > 128* 131* 132*  --   132* 130*  K 5.3*   < > 5.9* 5.7* 3.8  --  4.0 4.3  CL 86*  --   --  90* 90*  --  90* 88*  CO2 31  --   --  28 25  --  28 25  GLUCOSE 171*  --   --  206* 177*  --  174* 159*  BUN 62*  --   --  64* 37*  --  40* 68*  CREATININE 7.71*  --   --  7.58* 4.99*  --  5.11* 7.06*  CALCIUM 9.5  --   --  9.0 8.9  --  8.9 8.8*  MG  --   --   --   --  1.7 2.1  --   --   PHOS  --   --   --   --   --   --  5.2*  --    < > = values in this interval not displayed.    GFR: Estimated Creatinine Clearance: 8.1 mL/min (A) (by C-G formula based on SCr of 7.06 mg/dL (H)).  Liver Function Tests: Recent Labs  Lab 05/06/23 2111 05/08/23 0058  AST 23  --   ALT 14  --   ALKPHOS 126  --   BILITOT 0.6  --   PROT 6.8  --   ALBUMIN 3.3* 3.2*   No results for input(s): "LIPASE", "AMYLASE" in the last 168 hours. No results for input(s): "AMMONIA" in the last 168 hours.  Coagulation Profile: No results for input(s): "INR", "PROTIME" in the last 168 hours.  Cardiac Enzymes: No results for input(s): "CKTOTAL", "CKMB", "CKMBINDEX", "TROPONINI" in the last 168 hours.  BNP (last 3 results) No results for input(s): "PROBNP" in the last 8760 hours.  Lipid Profile: No results for input(s): "CHOL", "HDL", "LDLCALC", "TRIG", "CHOLHDL", "LDLDIRECT" in the last 72 hours.  Thyroid Function Tests: No results for input(s): "TSH", "T4TOTAL", "FREET4", "T3FREE", "THYROIDAB" in the last 72 hours.  Anemia Panel: No results for input(s): "VITAMINB12", "FOLATE", "FERRITIN", "TIBC", "IRON", "RETICCTPCT" in the last 72 hours.  Urine analysis:    Component Value Date/Time   COLORURINE YELLOW 11/27/2021 0956   APPEARANCEUR HAZY (A) 11/27/2021 0956   LABSPEC 1.012 11/27/2021 0956   PHURINE 5.0 11/27/2021 0956   GLUCOSEU NEGATIVE 11/27/2021 0956   HGBUR NEGATIVE 11/27/2021 0956   BILIRUBINUR NEGATIVE 11/27/2021 0956   KETONESUR NEGATIVE 11/27/2021 0956   PROTEINUR 30 (A) 11/27/2021 0956   UROBILINOGEN 0.2 08/04/2014  1409   NITRITE NEGATIVE 11/27/2021 0956   LEUKOCYTESUR NEGATIVE 11/27/2021 0956    Sepsis Labs: Lactic Acid, Venous    Component Value Date/Time   LATICACIDVEN 0.6 05/07/2023 0258    MICROBIOLOGY: Recent Results (from the past 240 hour(s))  SARS Coronavirus 2 by RT PCR (hospital order, performed in [facility] hospital lab) *cepheid single result test* Anterior Nasal Swab     Status: None   Collection Time: 05/06/23  7:38 PM   Specimen: Anterior Nasal Swab  Result Value Ref Range Status   SARS Coronavirus 2 by RT PCR NEGATIVE NEGATIVE Final    Comment: Performed at [facility], [address] [place], [place] [address]    RADIOLOGY STUDIES/RESULTS: No results found.   LOS: 2 days   Jeoffrey Massed, MD  [facility]    To contact the attending provider between 7A-7P or the covering provider during after hours 7P-7A, please log into the web site [email or web address] and access using universal Watson password for that web site. If you do not have the password, please call the hospital operator.  05/09/2023, 11:42 AM

## 2023-05-09 NOTE — Progress Notes (Signed)
Pt refused BIPAP.

## 2023-05-09 NOTE — Plan of Care (Signed)
  Problem: Education: Goal: Knowledge of disease or condition will improve Outcome: Progressing   Problem: Activity: Goal: Ability to tolerate increased activity will improve Outcome: Progressing   Problem: Respiratory: Goal: Ability to maintain a clear airway will improve Outcome: Progressing   Problem: Coping: Goal: Ability to adjust to condition or change in health will improve Outcome: Progressing   Problem: Fluid Volume: Goal: Ability to maintain a balanced intake and output will improve Outcome: Progressing

## 2023-05-09 NOTE — Progress Notes (Signed)
Subjective:  seen in HD  with no co, tolerating hd uf  , no sob ,OX3 , Normal MS  Objective Vital signs in last 24 hours: Vitals:   05/09/23 1013 05/09/23 1017 05/09/23 1023 05/09/23 1032  BP:  132/71 130/61 (!) 119/56  Pulse:  94 99 99  Resp:  18 (!) 22   Temp:  98.2 F (36.8 C)    TempSrc:      SpO2:  96% 95% 94%  Weight: (S) 99.7 kg     Height:       Weight change: -0.6 kg    Physical Exam: General: obese , Alert  elderly female chronically ill appearing  NAD, O x3  Heart: RRR , no MRG Lungs: L sided  exp wheezing  ,non labored on Glide o2 2L   Abdomen: Obese, NABS , Soft , NT  abd wall edema  Extremities: trace bipedal edema    Dialysis Access: LUA AVG patent on hd       OP Dialysis Orders:  [facility] , TTS 4 hours , EDW 100.8 kg  2K 2 Ca bath, LUA AVG heparin 2000 units  Mircera 50 mcg every 2 weeks last dose 05/04/23  venofer 50 mg q wkly , Hectorol 3 mcg q. 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 3.7 LUF  on HD  7/30 , today uf as tolerated  ESRD -  Hd  TTS , HD today  on schedule Hypertension/volume  - BP  ok , vol as abv  Anemia  - hgb  9.9  ( just had esa 7/27)  Metabolic bone disease -  Ca corec  ok, continue Hectorol next hd  thurs/ phos 5.2 Phos binders  with diet   DM  Type 2 = per admit team  HO  lung cancer-s/p curative radiation     [name] Pastel, PA-C [facility] Beeper [phone] 05/09/2023,11:03 AM  LOS: 2 days   Labs: Basic Metabolic Panel: Recent Labs  Lab 05/07/23 2109 05/08/23 0058 05/09/23 0527  NA 132* 132* 130*  K 3.8 4.0 4.3  CL 90* 90* 88*  CO2 25 28 25   GLUCOSE 177* 174* 159*  BUN 37* 40* 68*  CREATININE 4.99* 5.11* 7.06*  CALCIUM 8.9 8.9 8.8*  PHOS  --  5.2*  --    Liver Function Tests: Recent Labs  Lab 05/06/23 2111 05/08/23 0058  AST 23  --   ALT 14  --   ALKPHOS 126   --   BILITOT 0.6  --   PROT 6.8  --   ALBUMIN 3.3* 3.2*   No results for input(s): "LIPASE", "AMYLASE" in the last 168 hours. No results for input(s): "AMMONIA" in the last 168 hours. CBC: Recent Labs  Lab 05/06/23 2111 05/07/23 0050 05/07/23 0619 05/08/23 0057 05/09/23 0527  WBC 10.3  --  13.4* 12.0* 7.9  NEUTROABS 8.6*  --   --   --   --   HGB 10.2*   < > 10.5* 10.0* 9.9*  HCT 35.7*   < > 36.8 34.4* 33.7*  MCV 105.9*  --  105.7* 102.7* 103.1*  PLT 208  --  214 230 269   < > = values in this interval not displayed.   Cardiac Enzymes: No results for input(s): "CKTOTAL", "CKMB", "CKMBINDEX", "TROPONINI"  in the last 168 hours. CBG: Recent Labs  Lab 05/08/23 0748 05/08/23 1155 05/08/23 1619 05/08/23 2043 05/09/23 0724  GLUCAP 204* 183* 244* 318* 119*    Studies/Results: No results found. Medications:   allopurinol  100 mg Oral Daily   arformoterol  15 mcg Nebulization BID   azithromycin  500 mg Oral Daily   budesonide (PULMICORT) nebulizer solution  0.25 mg Nebulization BID   carvedilol  6.25 mg Oral BID WC   Chlorhexidine Gluconate Cloth  6 each Topical Q0600   clopidogrel  75 mg Oral Daily   doxercalciferol  3 mcg Intravenous Q T,Th,Sa-HD   heparin  5,000 Units Subcutaneous Q8H   insulin aspart  0-5 Units Subcutaneous QHS   insulin aspart  0-9 Units Subcutaneous TID WC   levothyroxine  175 mcg Oral Daily   pantoprazole  40 mg Oral BID AC   predniSONE  40 mg Oral Q breakfast   revefenacin  175 mcg Nebulization Daily   sevelamer carbonate  1,600 mg Oral TID WC   sodium chloride flush  3 mL Intravenous Q12H

## 2023-05-09 NOTE — Progress Notes (Signed)
PT Cancellation Note  Patient Details Name: [name] [name] MRN: [id number] DOB: 04/12/1951   Cancelled Treatment:    Reason Eval/Treat Not Completed: Patient at procedure or test/unavailable  Pt off the floor for dialysis.   Jerolyn Center, PT Acute Rehabilitation Services  Office [phone]  Zena Amos 05/09/2023, 11:00 AM

## 2023-05-09 NOTE — Progress Notes (Signed)
PT Cancellation Note  Patient Details Name: [name] [name] MRN: [id number] DOB: 09/25/1951   Cancelled Treatment:    Reason Eval/Treat Not Completed: Patient at procedure or test/unavailable  Patient remains off the unit. Will attempt early 8/2.    Jerolyn Center, PT Acute Rehabilitation Services  Office [phone]  Zena Amos 05/09/2023, 3:05 PM

## 2023-05-09 NOTE — Progress Notes (Signed)
   05/09/23 1435  Vitals  Temp 98.2 F (36.8 C)  Pulse Rate 89  Resp (!) 22  BP 130/75  SpO2 97 %  O2 Device Nasal Cannula  Oxygen Therapy  O2 Flow Rate (L/min) 2 L/min  Patient Activity (if Appropriate) In chair  Pulse Oximetry Type Continuous  Post Treatment  Dialyzer Clearance Clear  Duration of HD Treatment -hour(s) 3.5 hour(s)  Hemodialysis Intake (mL) 0 mL  Liters Processed 63  Fluid Removed (mL) 2.5 mL  Tolerated HD Treatment Yes  Post-Hemodialysis Comments Pt.voice no complaints. VSS, admin Medication and Report call to bedside 5W RN Mable Paris  AVG/AVF Arterial Site Held (minutes) 10 minutes  AVG/AVF Venous Site Held (minutes) 5 minutes   Received patient in bed to unit.  Alert and oriented.  Informed consent signed and in chart.   TX duration:3.5  Patient tolerated well.  Transported back to the room  Alert, without acute distress.  Hand-off given to patient's nurse.   Access used: Yes Access issues: No  Total UF removed: 2500 Medication(s) given: See Mar Post HD VS: See Above Grid Post HD weight: 97.3 kg   [facility]

## 2023-05-09 NOTE — TOC Progression Note (Signed)
Transition of Care [facility]) - Progression Note    Patient Details  Name: CAITLYNN MCBREAIRTY MRN: [id number] Date of Birth: [birth year]  Transition of Care [facility]) CM/SW Contact  Mearl Latin, LCSW Phone Number: 05/09/2023, 3:38 PM  Clinical Narrative:    Patient not in room. CSW contacted patient's son, Rosalia Hammers, and made him aware that if patient is ready for discharge tomorrow, therapy is going to work with her in the morning so insurance process can be started for patient to return to [place]. [place] aware. Son reported understanding of plan.    Expected Discharge Plan: Skilled Nursing Facility Barriers to Discharge: Continued Medical Work up, English as a second language teacher  Expected Discharge Plan and Services In-house Referral: Clinical Social Work   Post Acute Care Choice: Skilled Nursing Facility Living arrangements for the past 2 months: Apartment, Skilled Nursing Facility                                       Social Determinants of Health (SDOH) Interventions SDOH Screenings   Food Insecurity: No Food Insecurity (04/13/2023)  Housing: Medium Risk (04/13/2023)  Transportation Needs: No Transportation Needs (04/13/2023)  Utilities: Not At Risk (04/13/2023)  Tobacco Use: Medium Risk (05/07/2023)    Readmission Risk Interventions    05/09/2023    3:37 PM 05/08/2023   12:17 PM  Readmission Risk Prevention Plan  Transportation Screening Complete Complete  Medication Review (RN Care Manager) Complete Complete  PCP or Specialist appointment within 3-5 days of discharge Complete Complete  HRI or Home Care Consult Complete Complete  SW Recovery Care/Counseling Consult Complete Complete  Palliative Care Screening Not Applicable Not Applicable  Skilled Nursing Facility Complete Complete

## 2023-05-09 NOTE — NC FL2 (Signed)
[place] MEDICAID FL2 LEVEL OF CARE FORM     IDENTIFICATION  Patient Name: [name] [name] Birthdate: [birth year] Sex: female Admission Date (Current Location): 05/06/2023  [facility] [place] Number:  Producer, television/film/video and Address:  [facility], [address] [place], [place] [address]      Provider Number: [id number]  Attending Physician Name and Address:  Maretta Bees, MD  Relative Name and Phone Number:       Current Level of Care: Hospital Recommended Level of Care: Skilled Nursing Facility Prior Approval Number:    Date Approved/Denied:   PASRR Number: [id number]  Discharge Plan: SNF    Current Diagnoses: Patient Active Problem List   Diagnosis Date Noted   Acute respiratory failure (HCC) 08/07/2022   Respiratory failure, post-operative (HCC) 08/07/2022   Pulmonary edema 08/07/2022   Acute on chronic respiratory failure with hypercapnia (HCC) 08/07/2022   Lung mass 07/30/2022   Lung cancer, lower lobe (HCC) 01/31/2022   Pressure injury of skin 12/06/2021   Malnutrition of moderate degree 12/01/2021   ESRD (end stage renal disease) on dialysis [facility])    Lung nodules    Advanced care planning/counseling discussion    Goals of care, counseling/discussion    Acute respiratory failure with hypoxia and hypercarbia (HCC)    Labored breathing    Kidney lesion, native, left 11/24/2021   Syncope 11/21/2021   Chronic pain syndrome 07/01/2021   COPD exacerbation (HCC) 07/01/2021   Cellulitis of leg, right 07/01/2021   SARS-CoV-2 positive 02/21/2021   Increased weakness when ambulating 02/17/2021   Intractable nausea and vomiting 02/15/2021   Symptomatic anemia 01/24/2021   Anemia, unspecified 01/08/2020   Bacteremia 01/07/2020   UTI (urinary tract infection) 01/06/2020   Anemia 10/26/2019   S/P lumbar fusion 09/18/2019   Acute on chronic combined systolic and diastolic CHF (congestive heart failure) (HCC) 03/16/2019    Leukocytosis 03/16/2019   Chest pain 11/27/2018   Pre-syncope 11/27/2018   Epigastric abdominal pain 11/27/2018   Solitary pulmonary nodule 11/06/2018   Hypertensive heart and renal disease, stage 1-4 or unspecified chronic kidney disease, with heart failure (HCC) 10/20/2018   CHF (congestive heart failure), NYHA class III, acute on chronic, systolic (HCC) 10/20/2018   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, unspecified extremity (HCC) 04/19/2016   Cellulitis of abdominal wall 03/21/2015   Acute on chronic renal failure (HCC) 03/21/2015   DM (diabetes mellitus), type 2 with renal complications (HCC) 03/21/2015   Abdominal wall abscess 03/21/2015   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   Low urine output 08/11/2014   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   Hypothyroidism 08/11/2014   OSA (obstructive sleep apnea) 08/11/2014   Positive cardiac stress test 06/29/2014   Hyperlipidemia 02/12/2014   Left arm weakness 12/23/2013   TIA (transient ischemic attack) 12/23/2013   Left buttock abscess 10/12/2013   Cellulitis and abscess of leg, right 04/16/2013   Acute blood loss anemia 02/14/2013   HTN (hypertension) 02/14/2013   Cellulitis and abscess of buttock 02/09/2013   Class 2 obesity due to excess calories with body mass index (BMI) of 36.0 to 36.9 in adult 02/03/2013   Diarrhea 02/03/2013   Hyponatremia 02/03/2013   Sepsis due to undetermined organism (HCC) 02/02/2013   Diabetes mellitus without complication (HCC) 02/02/2013   CKD (chronic kidney disease), stage IV (HCC) 02/02/2013   Cellulitis 02/02/2013   PAD (peripheral artery disease) (HCC) 04/26/2011   Tobacco abuse 04/26/2011  Orientation RESPIRATION BLADDER Height & Weight     Self, Time, Situation, Place  O2 (2L nasal cannula) Incontinent Weight: 214 lb 8.1 oz (97.3 kg) Height:  5\' 3"  (160 cm)  BEHAVIORAL SYMPTOMS/MOOD NEUROLOGICAL BOWEL NUTRITION STATUS      Continent Diet (See dc summary)   AMBULATORY STATUS COMMUNICATION OF NEEDS Skin   Extensive Assist Verbally Other (Comment) (MASD on breast)                       Personal Care Assistance Level of Assistance  Bathing, Feeding, Dressing Bathing Assistance: Maximum assistance Feeding assistance: Limited assistance Dressing Assistance: Limited assistance     Functional Limitations Info             SPECIAL CARE FACTORS FREQUENCY  PT (By licensed PT), OT (By licensed OT)     PT Frequency: 5x/week OT Frequency: 5x/week            Contractures Contractures Info: Not present    Additional Factors Info  Code Status, Allergies, Insulin Sliding Scale Code Status Info: Full Allergies Info: Nebivolol, Zestril (Lisinopril), Ace Inhibitors, Morphine And Codeine   Insulin Sliding Scale Info: See dc summary       Current Medications (05/09/2023):  This is the current hospital active medication list Current Facility-Administered Medications  Medication Dose Route Frequency Provider Last Rate Last Admin   acetaminophen (TYLENOL) tablet 650 mg  650 mg Oral Q6H PRN Opyd, Lavone Neri, MD   650 mg at 05/09/23 1610   Or   acetaminophen (TYLENOL) suppository 650 mg  650 mg Rectal Q6H PRN Opyd, Lavone Neri, MD       albuterol (PROVENTIL) (2.5 MG/3ML) 0.083% nebulizer solution 2.5 mg  2.5 mg Nebulization Q2H PRN Opyd, Lavone Neri, MD       allopurinol (ZYLOPRIM) tablet 100 mg  100 mg Oral Daily Opyd, Lavone Neri, MD   100 mg at 05/09/23 0826   arformoterol (BROVANA) nebulizer solution 15 mcg  15 mcg Nebulization BID Maretta Bees, MD   15 mcg at 05/09/23 0807   azithromycin (ZITHROMAX) tablet 500 mg  500 mg Oral Daily Opyd, Lavone Neri, MD   500 mg at 05/09/23 0826   budesonide (PULMICORT) nebulizer solution 0.25 mg  0.25 mg Nebulization BID Maretta Bees, MD   0.25 mg at 05/09/23 0807   carvedilol (COREG) tablet 6.25 mg  6.25 mg Oral BID WC Opyd, Lavone Neri, MD   6.25 mg at 05/08/23 1600   Chlorhexidine Gluconate Cloth 2  % PADS 6 each  6 each Topical Q0600 Tyler Pita, MD   6 each at 05/09/23 0510   clopidogrel (PLAVIX) tablet 75 mg  75 mg Oral Daily Opyd, Lavone Neri, MD   75 mg at 05/09/23 0826   doxercalciferol (HECTOROL) injection 3 mcg  3 mcg Intravenous Q T,Th,Sa-HD Lenny Pastel, PA-C   3 mcg at 05/09/23 1227   heparin injection 5,000 Units  5,000 Units Subcutaneous Q8H Opyd, Lavone Neri, MD   5,000 Units at 05/09/23 0509   insulin aspart (novoLOG) injection 0-5 Units  0-5 Units Subcutaneous QHS Opyd, Lavone Neri, MD   4 Units at 05/08/23 2050   insulin aspart (novoLOG) injection 0-9 Units  0-9 Units Subcutaneous TID WC Almon Hercules, MD   3 Units at 05/08/23 1823   levothyroxine (SYNTHROID) tablet 175 mcg  175 mcg Oral Daily Briscoe Deutscher, MD   175 mcg at 05/09/23 0509   pantoprazole (PROTONIX) EC  tablet 40 mg  40 mg Oral BID AC Opyd, Lavone Neri, MD   40 mg at 05/09/23 0826   [START ON 05/10/2023] predniSONE (DELTASONE) tablet 30 mg  30 mg Oral Q breakfast Ghimire, Werner Lean, MD       revefenacin (YUPELRI) nebulizer solution 175 mcg  175 mcg Nebulization Daily Maretta Bees, MD   175 mcg at 05/09/23 1914   sevelamer carbonate (RENVELA) tablet 1,600 mg  1,600 mg Oral TID WC Opyd, Lavone Neri, MD   1,600 mg at 05/09/23 0839   sodium chloride flush (NS) 0.9 % injection 3 mL  3 mL Intravenous Q12H Opyd, Lavone Neri, MD   3 mL at 05/09/23 7829     Discharge Medications: Please see discharge summary for a list of discharge medications.  Relevant Imaging Results:  Relevant Lab Results:   Additional Information SSN [id number]. Dialysis TTS  Mearl Latin, LCSW

## 2023-05-09 NOTE — Progress Notes (Signed)
OT Cancellation Note  Patient Details Name: [name] [name] MRN: [id number] DOB: [birth year]   Cancelled Treatment:    Reason Eval/Treat Not Completed: Patient at procedure or test/ unavailable (Pt currently off unit for HD. OT to reattempt skilled OT eval at a later time as appropriate/available.)  Masaye Gatchalian "Orson Eva., OTR/L, MA Acute Rehab [phone]   Lendon Colonel 05/09/2023, 11:22 AM

## 2023-05-10 DIAGNOSIS — J9622 Acute and chronic respiratory failure with hypercapnia: Secondary | ICD-10-CM | POA: Diagnosis not present

## 2023-05-10 DIAGNOSIS — J441 Chronic obstructive pulmonary disease with (acute) exacerbation: Secondary | ICD-10-CM | POA: Diagnosis not present

## 2023-05-10 DIAGNOSIS — I5043 Acute on chronic combined systolic (congestive) and diastolic (congestive) heart failure: Secondary | ICD-10-CM | POA: Diagnosis not present

## 2023-05-10 DIAGNOSIS — E1122 Type 2 diabetes mellitus with diabetic chronic kidney disease: Secondary | ICD-10-CM | POA: Diagnosis not present

## 2023-05-10 LAB — GLUCOSE, CAPILLARY
Glucose-Capillary: 126 mg/dL — ABNORMAL HIGH (ref 70–99)
Glucose-Capillary: 293 mg/dL — ABNORMAL HIGH (ref 70–99)
Glucose-Capillary: 339 mg/dL — ABNORMAL HIGH (ref 70–99)
Glucose-Capillary: 211 mg/dL — ABNORMAL HIGH (ref 70–99)

## 2023-05-10 MED ORDER — INCRUSE ELLIPTA 62.5 MCG/ACT IN AEPB: 1.0000 | INHALATION_SPRAY | Freq: Every day | RESPIRATORY_TRACT | Status: DC

## 2023-05-10 MED ORDER — PREDNISONE 10 MG PO TABS: ORAL_TABLET | ORAL | Status: DC

## 2023-05-10 NOTE — Progress Notes (Signed)
[facility] Patient Discharge Orders- [facility]: [facility]  Patient's name: [name] [name] Admit/DC Dates: 05/06/2023 - 05/10/23  Discharge Diagnoses: COPD exacerbation Acute on chronic hypoxic/hypercarbic respiratory failure (on home O2-2-3 L)   Acute on chronic combined diastolic/systolic heart failure in the setting of  ESRD on HD TTS  Aranesp: Given: no   Date and amount of last dose: no  Last Hgb: 9.9 PRBC's Given: no Date/# of units: o ESA dose for discharge: mircera 60 mcg IV q 2 weeks after last op dose  IV Iron dose at discharge: no  Heparin change: no  EDW Change: decreased New EDW:   98.5   Bath Change: no  Access intervention/Change: no Details:  Hectorol/Calcitriol change: no  Discharge Labs: Calcium8.8 Phosphorus 5.2 Albumin 3.2 K+ 4.3  IV Antibiotics: no Details:  On Coumadin?: no Last INR: Next INR: Managed By:   OTHER/APPTS/LAB ORDERS:    D/C Meds to be reconciled by nurse after every discharge.  Completed By:   Reviewed by: MD:______ RN_______

## 2023-05-10 NOTE — Progress Notes (Signed)
Pt refused BIPAP.

## 2023-05-10 NOTE — Evaluation (Signed)
Occupational Therapy Evaluation Patient Details Name: [name] [name] MRN: [id number] DOB: 31-Oct-1950 Today's Date: 05/10/2023   History of Present Illness The pt is a 72 yo female presenting 7/29 from [facility] with SOB. Work up revealed CHF exacerbation and acute on chronic resp failure with hypoxia and hypercapnia in setting of COPD exacerbation. 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   Clinical Impression   Pt admitted to [facility] from [facility] where pt was receiving short-term skilled OT services. At baseline, pt completes UB ADLs Independent to Min assist and LB ADLs with Mod to Max assist. Pt now presents with decreased decreased activity tolerance, increased pain in Right shoulder with PROM/AAROM, decreased Right UE gross motor coordination, and decreased safety and independence with ADLs. Pt currently demonstrates ability to complete UB ADLs Independent to Mod assist, LB ADLs with Total assist +2, and functional transfers with a Stedy with Mod assist and increased time. Pt will benefit from acute skilled OT services to address deficits outlined below, decrease caregiver burden, and increase safety and independence with ADLs and functional transfers. Post acute discharge, pt will benefit from continued intensive inpatient skilled OT services < 3 per day to maximize rehab potential.      Recommendations for follow up therapy are one component of a multi-disciplinary discharge planning process, led by the attending physician.  Recommendations may be updated based on patient status, additional functional criteria and insurance authorization.   Assistance Recommended at Discharge Frequent or constant  Supervision/Assistance  Patient can return home with the following Two people to help with bathing/dressing/bathroom;A lot of help with walking and/or transfers;Assistance with cooking/housework;Assist for transportation;Help with stairs or ramp for entrance    Functional Status Assessment  Patient has had a recent decline in their functional status and demonstrates the ability to make significant improvements in function in a reasonable and predictable amount of time.  Equipment Recommendations  Other (comment) (Defer to next level of care)    Recommendations for Other Services       Precautions / Restrictions Precautions Precautions: Fall Precaution Comments: on 3L O2 at baseline Restrictions Weight Bearing Restrictions: No      Mobility Bed Mobility Overal bed mobility: Needs Assistance             General bed mobility comments: Pt sitting in recliner at beginning and end of session. Per PT on same day, pt requiringMod assist with bed mobility.    Transfers Overall transfer level: Needs assistance Equipment used: Ambulation equipment used Transfers: Sit to/from Stand, Bed to chair/wheelchair/BSC Sit to Stand: Mod assist, From elevated surface           General transfer comment: Per PT note on same day. Pt deferred transfer this session secondary to fatigue following PT. Transfer via Lift Equipment: Stedy    Balance Overall balance assessment: Needs assistance Sitting-balance support: No upper extremity supported, Feet supported Sitting balance-Leahy Scale: Good     Standing balance support: Bilateral upper extremity supported, During functional activity, Reliant on assistive device for balance Standing balance-Leahy Scale: Poor Standing balance comment: dependent on BUE support  ADL either performed or assessed with clinical judgement   ADL Overall ADL's : Needs assistance/impaired Eating/Feeding: Independent;Sitting    Grooming: Set up;Sitting   Upper Body Bathing: Moderate assistance;Sitting   Lower Body Bathing: Total assistance;+2 for physical assistance;Sit to/from stand   Upper Body Dressing : Moderate assistance;Sitting;Cueing for compensatory techniques   Lower Body Dressing: Total assistance;+2 for physical assistance;Sit to/from stand   Toilet Transfer: Moderate assistance;BSC/3in1 (with use of Stedy)   Toileting- Clothing Manipulation and Hygiene: Total assistance;+2 for physical assistance;Sit to/from stand         General ADL Comments: Pt with decreased activity tolerance and  decreased indpendence with ADLs and funcitonal transfers from baseline.     Vision Baseline Vision/History: 1 Wears glasses (readers; pt reports history of cataracts with B surgery) Ability to See in Adequate Light: 0 Adequate (with glasses) Patient Visual Report: No change from baseline       Perception     Praxis      Pertinent Vitals/Pain Pain Assessment Pain Assessment: Faces Faces Pain Scale: Hurts little more Pain Location: R knee with mobility, R shoulder with PROM/AAROM Pain Descriptors / Indicators: Discomfort, Grimacing, Guarding Pain Intervention(s): Limited activity within patient's tolerance, Monitored during session, Repositioned     Hand Dominance Right   Extremity/Trunk Assessment Upper Extremity Assessment Upper Extremity Assessment: Generalized weakness;RUE deficits/detail RUE Deficits / Details: limited flexion and abduction of R shoulder; generalized weakness in shoulder with strength otherwise 4-/5 RUE: Shoulder pain with ROM RUE Sensation: WNL RUE Coordination: decreased gross motor   Lower Extremity Assessment Lower Extremity Assessment: Defer to PT evaluation RLE Deficits / Details: limited by pain in knee, grossly 3/5 to MMT. RLE: Unable to fully assess due to pain RLE Sensation: history of peripheral neuropathy RLE Coordination: WNL   Cervical / Trunk  Assessment Cervical / Trunk Assessment: Other exceptions Cervical / Trunk Exceptions: increased body habitus   Communication Communication Communication: No difficulties   Cognition Arousal/Alertness: Awake/alert Behavior During Therapy: WFL for tasks assessed/performed Overall Cognitive Status: Within Functional Limits for tasks assessed                                 General Comments: AAOx4, demonstrates ability to follow 2-step commands consistently, and pleasant throughout session. Mild short-term memory deficits noted during session.     General Comments  VSS on 3L continuous O2 through nasal cannula throughout session    Exercises     Shoulder Instructions      Home Living Family/patient expects to be discharged to:: Skilled nursing facility                                 Additional Comments: Pt currently at [facility] for short-term rehab with pt stated goal of returning home with son.      Prior Functioning/Environment Prior Level of Function : Needs assist       Physical Assist : Mobility (physical);ADLs (physical) Mobility (physical): Bed mobility;Transfers;Gait ADLs (physical): Bathing;Dressing;Toileting;IADLs Mobility Comments: pt uses lift or stedy to complete sit-stand and to transfer to [facility] or toilet. primarily uses WC in facility ADLs Comments: Pt reports recently completing UB/LB bathing, toileting, and LB dressing with Total assist with use of Stedy for sit/stand and transfers, UB dressing with Mod to Max asssist, and grooming and self-feeding Independently. Prior to previous hospitalization leading to placement at [facility], pt  reports completeing LB ADLs with Mod to Max assist and UB ADLs with Independent to Min assist.        OT Problem List: Decreased strength;Decreased range of motion;Decreased activity tolerance;Impaired balance (sitting and/or standing);Decreased coordination      OT Treatment/Interventions:  Self-care/ADL training;Therapeutic exercise;Energy conservation;DME and/or AE instruction;Therapeutic activities;Patient/family education;Balance training    OT Goals(Current goals can be found in the care plan section) Acute Rehab OT Goals Patient Stated Goal: To return to [facility] and continue with skilled short-term rehab with goal to return home OT Goal Formulation: With patient Time For Goal Achievement: 05/24/23 Potential to Achieve Goals: Good ADL Goals Pt Will Perform Upper Body Bathing: with min guard assist;with adaptive equipment;sitting Pt Will Perform Upper Body Dressing: sitting;with supervision Pt Will Transfer to Toilet: with min assist;ambulating;bedside commode (with least restrictive AD) Pt Will Perform Toileting - Clothing Manipulation and hygiene: with mod assist;sit to/from stand Pt/caregiver will Perform Home Exercise Program: Increased ROM;With Supervision;With written HEP provided (Left shoulder AAROM)  OT Frequency: Min 1X/week    Co-evaluation              AM-PAC OT "6 Clicks" Daily Activity     Outcome Measure Help from another person eating meals?: None Help from another person taking care of personal grooming?: A Little Help from another person toileting, which includes using toliet, bedpan, or urinal?: Total Help from another person bathing (including washing, rinsing, drying)?: A Lot Help from another person to put on and taking off regular upper body clothing?: A Lot Help from another person to put on and taking off regular lower body clothing?: Total 6 Click Score: 13   End of Session Nurse Communication: Mobility status  Activity Tolerance: Patient tolerated treatment well Patient left: in chair;with call bell/phone within reach  OT Visit Diagnosis: Unsteadiness on feet (R26.81);Pain;Muscle weakness (generalized) (M62.81)                Time: 1610-9604 OT Time Calculation (min): 25 min Charges:  OT General Charges $OT Visit: 1 Visit OT  Evaluation $OT Eval Low Complexity: 1 Low   "Kyle" M., OTR/L, MA Acute Rehab [phone]   Lendon Colonel 05/10/2023, 10:10 AM

## 2023-05-10 NOTE — Evaluation (Signed)
Physical Therapy Evaluation Patient Details Name: [name] [name] MRN: [id number] DOB: [birth year] Today's Date: 05/10/2023  History of Present Illness  The pt is a 72 yo female presenting 7/29 from [facility] with SOB. Work up revealed CHF exacerbation and acute on chronic resp failure with hypoxia and hypercapnia in setting of COPD exacerbation. 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   Clinical Impression  Pt in bed upon arrival of PT, agreeable to evaluation at this time. Prior to admission the pt was receiving assist from staff for all mobility, and using either a stedy or lift to get to her WC and toilet at [place]. The pt presents with limitations in functional mobility, strength in RLE and RUE, power, stability, and activity tolerance due to above dx and chronic debility, and will continue to benefit from skilled PT to address these deficits. The pt needed modA to complete all bed mobility as well as modA to rise from elevated bed using stedy. She demos poor functional strength and power in R-sided extremities (due to chronic pain/arthritis), which requires increased assist. She is heavily dependent on BUE support to maintain standing, and needed minA at times in stedy. The pt will benefit from skilled PT at inpatient setting to improve functional strength and stability in LE as pt is hopeful to return to ability to complete stand-pivot transfers.         If plan is discharge home, recommend the following: A lot of help with walking and/or transfers;A lot of help with bathing/dressing/bathroom;Assistance with cooking/housework;Direct supervision/assist for medications management;Direct supervision/assist for financial  management;Assist for transportation   Can travel by private vehicle   No    Equipment Recommendations None recommended by PT  Recommendations for Other Services       Functional Status Assessment Patient has had a recent decline in their functional status and demonstrates the ability to make significant improvements in function in a reasonable and predictable amount of time.     Precautions / Restrictions Precautions Precautions: Fall Precaution Comments: on 3L O2 at baseline Restrictions Weight Bearing Restrictions: No      Mobility  Bed Mobility Overal bed mobility: Needs Assistance Bed Mobility: Supine to Sit     Supine to sit: Mod assist, HOB elevated     General bed mobility comments: modA with use of bed rail    Transfers Overall transfer level: Needs assistance Equipment used: Ambulation equipment used Transfers: Sit to/from Stand, Bed to chair/wheelchair/BSC Sit to Stand: Mod assist, From elevated surface           General transfer comment: modA to manage slow rise to standing in stedy, then stable with transfer to chair in stedy. poor ability to reach for bar with RUE, once assisted to bar pt able to use to assist Transfer via Lift Equipment: Stedy  Ambulation/Gait               General Gait Details: pt unable     Balance Overall balance assessment: Needs assistance Sitting-balance support: No upper extremity supported, Feet supported Sitting balance-Leahy Scale: Good     Standing balance support: Bilateral upper extremity supported, During functional activity, Reliant on assistive device for balance Standing balance-Leahy Scale: Poor  Standing balance comment: dependent on BUE support                             Pertinent Vitals/Pain Pain Assessment Pain Assessment: Faces Faces Pain Scale: Hurts a little bit Pain Location: R knee with mobility Pain Descriptors / Indicators: Discomfort, Grimacing, Guarding Pain  Intervention(s): Limited activity within patient's tolerance, Monitored during session, Repositioned    Home Living Family/patient expects to be discharged to:: Skilled nursing facility                   Additional Comments: pt at [facility]    Prior Function Prior Level of Function : Needs assist       Physical Assist : Mobility (physical);ADLs (physical) Mobility (physical): Bed mobility;Transfers;Gait ADLs (physical): Bathing;Dressing;Toileting;IADLs Mobility Comments: pt uses lift or stedy to complete sit-stand and to transfer to [facility] or toilet. primarily uses WC in facility ADLs Comments: assisted by staff, use of stedy to get into bathroom for toileting     Hand Dominance   Dominant Hand: Right    Extremity/Trunk Assessment   Upper Extremity Assessment Upper Extremity Assessment: Defer to OT evaluation RUE Deficits / Details: limited flexion and abduction of R shoulder    Lower Extremity Assessment Lower Extremity Assessment: Generalized weakness;RLE deficits/detail RLE Deficits / Details: limited by pain in knee, grossly 3/5 to MMT. RLE: Unable to fully assess due to pain RLE Sensation: history of peripheral neuropathy RLE Coordination: WNL    Cervical / Trunk Assessment Cervical / Trunk Assessment: Other exceptions Cervical / Trunk Exceptions: increased body habitus  Communication   Communication: No difficulties  Cognition Arousal/Alertness: Awake/alert Behavior During Therapy: WFL for tasks assessed/performed Overall Cognitive Status: Within Functional Limits for tasks assessed                                 General Comments: pt able to answer all questions appropriately and use hospital phone to place breakfast order without assist        General Comments General comments (skin integrity, edema, etc.): VSS on 3L O2    Exercises General Exercises - Lower Extremity Ankle Circles/Pumps: AROM, Both, 10 reps Long Arc Quad: AROM, 10 reps,  Both, Seated Heel Slides: AAROM, Both, 10 reps, Supine   Assessment/Plan    PT Assessment Patient needs continued PT services  PT Problem List Decreased strength;Decreased activity tolerance;Decreased balance;Decreased mobility;Decreased cognition;Cardiopulmonary status limiting activity;Pain       PT Treatment Interventions DME instruction;Gait training;Functional mobility training;Therapeutic activities;Therapeutic exercise;Balance training;Neuromuscular re-education;Patient/family education;Cognitive remediation;Wheelchair mobility training    PT Goals (Current goals can be found in the Care Plan section)  Acute Rehab PT Goals Patient Stated Goal: return to rehab PT Goal Formulation: With patient Time For Goal Achievement: 05/24/23 Potential to Achieve Goals: Good    Frequency Min 1X/week        AM-PAC PT "6 Clicks" Mobility  Outcome Measure Help needed turning from your back to your side while in a flat bed without using bedrails?: A Little Help needed moving from lying on your back to sitting on the side of a flat bed without using bedrails?: A Lot Help needed moving to and from a bed to a chair (including a wheelchair)?: Total Help needed standing up from a chair using your arms (e.g., wheelchair or bedside chair)?: A Lot Help needed to walk in hospital room?: Total Help  needed climbing 3-5 steps with a railing? : Total 6 Click Score: 10    End of Session Equipment Utilized During Treatment: Gait belt;Oxygen Activity Tolerance: Patient limited by pain Patient left: in chair;with call bell/phone within reach;with chair alarm set Nurse Communication: Mobility status;Need for lift equipment (use of stedy) PT Visit Diagnosis: Unsteadiness on feet (R26.81);Other abnormalities of gait and mobility (R26.89);Muscle weakness (generalized) (M62.81);Difficulty in walking, not elsewhere classified (R26.2);Pain Pain - Right/Left: Right Pain - part of body: Knee    Time:  0826-0900 PT Time Calculation (min) (ACUTE ONLY): 34 min   Charges:   PT Evaluation $PT Eval Low Complexity: 1 Low PT Treatments $Therapeutic Activity: 8-22 mins PT General Charges $$ ACUTE PT VISIT: 1 Visit         Vickki Muff, PT, DPT   Acute Rehabilitation Department Office [phone] Secure Chat Communication Preferred  Ronnie Derby 05/10/2023, 9:29 AM

## 2023-05-10 NOTE — Care Management Important Message (Signed)
Important Message  Patient Details  Name: [name] [name] MRN: [id number] Date of Birth: 1951-08-17   Medicare Important Message Given:  Yes      Stefan Church 05/10/2023, 2:38 PM

## 2023-05-10 NOTE — Progress Notes (Signed)
Subjective:  sitting up in bedside chair, no sob , tolerated hd yest  2.5 l uf /noted for dc today   Objective Vital signs in last 24 hours: Vitals:   05/10/23 0029 05/10/23 0345 05/10/23 0700 05/10/23 0752  BP: 132/63 137/61 (!) 104/58   Pulse: (!) 102 92 95   Resp:      Temp: 99 F (37.2 C) 98.2 F (36.8 C) 98.3 F (36.8 C)   TempSrc: Oral Oral Oral   SpO2: 95% 98% 94% 96%  Weight: 101.8 kg     Height:       Weight change: -1.9 kg  PPhysical Exam: General:  Alert  elderly female chronically ill appearing  NAD, O x3  Heart: RRR , no MRG Lungs: L sided  exp wheezing  ,non labored on [place] o2 2L   Abdomen: Obese, NABS , Soft , NT  abd wall edema  Extremities: trace bipedal edema    Dialysis Access: LUA AVG patent on hd       OP Dialysis Orders:  [facility] , TTS 4 hours , EDW 100.8 kg  2K 2 Ca bath, LUA AVG heparin 2000 units  Mircera 50 mcg every 2 weeks last dose 05/04/23  venofer 50 mg q wkly , Hectorol 3 mcg q. 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 3.7 LUF  on HD  7/30 , 2.5 yest hd uf as tolerated  ESRD -  Hd  TTS , HD on schedule Hypertension/volume  - BP  ok , vol as abv . 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  HO  lung cancer-s/p curative radiation  Lenny Pastel, PA-C [facility] [phone] 05/10/2023,10:16 AM  LOS: 3 days   Labs: Basic Metabolic Panel: Recent Labs  Lab 05/07/23 2109 05/08/23 0058 05/09/23 0527  NA 132* 132* 130*  K 3.8 4.0 4.3  CL 90* 90* 88*  CO2 25 28 25   GLUCOSE 177* 174* 159*  BUN 37* 40* 68*  CREATININE 4.99* 5.11* 7.06*  CALCIUM 8.9 8.9 8.8*  PHOS  --  5.2*  --    Liver Function Tests: Recent Labs  Lab  05/06/23 2111 05/08/23 0058  AST 23  --   ALT 14  --   ALKPHOS 126  --   BILITOT 0.6  --   PROT 6.8  --   ALBUMIN 3.3* 3.2*   No results for input(s): "LIPASE", "AMYLASE" in the last 168 hours. No results for input(s): "AMMONIA" in the last 168 hours. CBC: Recent Labs  Lab 05/06/23 2111 05/07/23 0050 05/07/23 0619 05/08/23 0057 05/09/23 0527  WBC 10.3  --  13.4* 12.0* 7.9  NEUTROABS 8.6*  --   --   --   --   HGB 10.2*   < > 10.5* 10.0* 9.9*  HCT 35.7*   < > 36.8 34.4* 33.7*  MCV 105.9*  --  105.7* 102.7* 103.1*  PLT 208  --  214 230 269   < > = values in this interval not displayed.   Cardiac Enzymes:  No results for input(s): "CKTOTAL", "CKMB", "CKMBINDEX", "TROPONINI" in the last 168 hours. CBG: Recent Labs  Lab 05/08/23 2043 05/09/23 0724 05/09/23 1527 05/09/23 2102 05/10/23 0749  GLUCAP 318* 119* 240* 272* 126*    Studies/Results: No results found. Medications:   allopurinol  100 mg Oral Daily   arformoterol  15 mcg Nebulization BID   azithromycin  500 mg Oral Daily   budesonide (PULMICORT) nebulizer solution  0.25 mg Nebulization BID   carvedilol  6.25 mg Oral BID WC   Chlorhexidine Gluconate Cloth  6 each Topical Q0600   clopidogrel  75 mg Oral Daily   doxercalciferol  3 mcg Intravenous Q T,Th,Sa-HD   heparin  5,000 Units Subcutaneous Q8H   insulin aspart  0-5 Units Subcutaneous QHS   insulin aspart  0-9 Units Subcutaneous TID WC   levothyroxine  175 mcg Oral Daily   pantoprazole  40 mg Oral BID AC   predniSONE  30 mg Oral Q breakfast   revefenacin  175 mcg Nebulization Daily   sevelamer carbonate  1,600 mg Oral TID WC   sodium chloride flush  3 mL Intravenous Q12H

## 2023-05-10 NOTE — Plan of Care (Signed)
  Problem: Education: Goal: Knowledge of disease or condition will improve Outcome: Progressing Goal: Knowledge of the prescribed therapeutic regimen will improve Outcome: Progressing   Problem: Respiratory: Goal: Ability to maintain a clear airway will improve Outcome: Progressing   Problem: Clinical Measurements: Goal: Will remain free from infection Outcome: Progressing   Problem: Activity: Goal: Ability to tolerate increased activity will improve Outcome: Not Progressing   Problem: Health Behavior/Discharge Planning: Goal: Ability to manage health-related needs will improve Outcome: Not Progressing

## 2023-05-10 NOTE — TOC Progression Note (Addendum)
Transition of Care [facility]) - Progression Note    Patient Details  Name: [name] [name] MRN: [id number] Date of Birth: [birth year]  Transition of Care [facility]) CM/SW Contact  Leander Rams, LCSW Phone Number: 05/10/2023, 10:14 AM  Clinical Narrative:    CSW started insurance auth for [facility]. Insurance auth pending.   TOC will continue to follow.   3:30PM CSW received called for Navi stating case will need to be sent to the medical director for further review. CSW made MD aware.   Expected Discharge Plan: Skilled Nursing Facility Barriers to Discharge: Continued Medical Work up, English as a second language teacher  Expected Discharge Plan and Services In-house Referral: Clinical Social Work   Post Acute Care Choice: Skilled Nursing Facility Living arrangements for the past 2 months: Apartment, Skilled Nursing Facility Expected Discharge Date: 05/10/23                                     Social Determinants of Health (SDOH) Interventions SDOH Screenings   Food Insecurity: No Food Insecurity (04/13/2023)  Housing: Medium Risk (04/13/2023)  Transportation Needs: No Transportation Needs (04/13/2023)  Utilities: Not At Risk (04/13/2023)  Tobacco Use: Medium Risk (05/07/2023)    Readmission Risk Interventions    05/09/2023    3:37 PM 05/08/2023   12:17 PM  Readmission Risk Prevention Plan  Transportation Screening Complete Complete  Medication Review Oceanographer) Complete Complete  PCP or Specialist appointment within 3-5 days of discharge Complete Complete  HRI or Home Care Consult Complete Complete  SW Recovery Care/Counseling Consult Complete Complete  Palliative Care Screening Not Applicable Not Applicable  Skilled Nursing Facility Complete Complete   Oletta Lamas, MSW, LCSWA, LCASA Transitions of Care  Clinical Social Worker I

## 2023-05-10 NOTE — Progress Notes (Signed)
Case discussed with CSW. Advised insurance auth for snf is still pending at this time. Will contact pt's out-pt clinic to provide this update. Will assist as needed.   Olivia Canter Renal Navigator [phone]

## 2023-05-10 NOTE — Discharge Summary (Signed)
PATIENT DETAILS Name: [name] [name] Age: 72 y.o. Sex: female Date of Birth: [birth year] MRN: [id number]. Admitting Physician: Briscoe Deutscher, MD WUJ:[id number], [name], FNP  Admit Date: 05/06/2023 Discharge date: 05/10/2023  Recommendations for Outpatient Follow-up:  Follow up with PCP in 1-2 weeks Please obtain CMP/CBC in one week Ensure follow-up with dialysis clinic  Admitted From:  Home  Disposition: Skilled nursing facility   Discharge Condition: good  CODE STATUS:   Code Status: Full Code   Diet recommendation:  Diet Order             Diet - low sodium heart healthy           Diet general           Diet renal with fluid restriction Fluid restriction: 1200 mL Fluid; Room service appropriate? Yes; Fluid consistency: Thin  Diet effective now                    Brief Summary: Patient is a 72 y.o.  female with history of COPD, non-small cell cancer-s/p curative radiation (completed April 06, 2022), ESRD on HD, HTN, DM-2, chronic combined systolic/diastolic heart failure, OSA with CPAP intolerance-presented with worsening shortness of breath-found to have COPD exacerbation with worsening hypercarbia-subsequently admitted to the hospitalist service.  See below for further details.   Significant events: 7/29>> admit to [facility]   Significant studies: 7/29>> CT angio chest: No PE.  Vascular congestion but no overt edema.   Significant microbiology data: 7/29>> COVID PCR: Negative   Procedures: None   Consults: Nephrology  Brief Hospital Course: COPD exacerbation Acute on chronic hypoxic/hypercarbic respiratory failure (on home O2-2-3 L) Overall improved  Down to 2 L of oxygen which is at her baseline Continue tapering steroids for a few more days Continue bronchodilators Minimize sedating medications as much as possible.   Acute on chronic combined diastolic/systolic heart failure in the setting of ESRD Significantly better after dialysis x 2-resume usual  dialysis postdischarge.    ESRD on HD TTS Nephrology followed closely-resume prior dialysis schedule postdischarge.  OSA Intolerant to CPAP On home oxygen   History of lung cancer-s/p curative radiation Resume outpatient follow-up with radiation/medical oncology   History of PAD Plavix   Hypothyroidism Synthroid   Uncontrolled DM-2-with steroid-induced hyperglycemia (A1c 7.1 on 7/6) CBGs now stable-continue SSI  Debility/deconditioning Worsened by acute illness Per patient-she is minimally ambulatory and mostly bed to wheelchair bound PT/OT eval-SNF recommended-social work following.   Obesity: Estimated body mass index is 38.94 kg/m as calculated from the following:   Height as of this encounter: 5\' 3"  (1.6 m).   Weight as of this encounter: 99.7 kg.    Discharge Diagnoses:  Principal Problem:   Acute on chronic respiratory failure with hypercapnia (HCC) Active Problems:   HTN (hypertension)   DM (diabetes mellitus), type 2 with renal complications (HCC)   Acute on chronic combined systolic and diastolic CHF (congestive heart failure) (HCC)   Chronic pain syndrome   COPD exacerbation (HCC)   ESRD (end stage renal disease) on dialysis (HCC)   Lung cancer, lower lobe [facility])   Discharge Instructions:  Activity:  As tolerated with Full fall precautions use walker/cane & assistance as needed   Discharge Instructions     Call MD for:  difficulty breathing, headache or visual disturbances   Complete by: As directed    Call MD for:  persistant nausea and vomiting   Complete by: As directed    Diet -  low sodium heart healthy   Complete by: As directed    Diet general   Complete by: As directed    Discharge instructions   Complete by: As directed    Follow with Primary MD  Vladimir Crofts, FNP in 1-2 weeks  Please get a complete blood count and chemistry panel checked by your Primary MD at your next visit, and again as instructed by your Primary MD.  Get  Medicines reviewed and adjusted: Please take all your medications with you for your next visit with your Primary MD  Laboratory/radiological data: Please request your Primary MD to go over all hospital tests and procedure/radiological results at the follow up, please ask your Primary MD to get all Hospital records sent to his/her office.  In some cases, they will be blood work, cultures and biopsy results pending at the time of your discharge. Please request that your primary care M.D. follows up on these results.  Also Note the following: If you experience worsening of your admission symptoms, develop shortness of breath, life threatening emergency, suicidal or homicidal thoughts you must seek medical attention immediately by calling 911 or calling your MD immediately  if symptoms less severe.  You must read complete instructions/literature along with all the possible adverse reactions/side effects for all the Medicines you take and that have been prescribed to you. Take any new Medicines after you have completely understood and accpet all the possible adverse reactions/side effects.   Do not drive when taking Pain medications or sleeping medications (Benzodaizepines)  Do not take more than prescribed Pain, Sleep and Anxiety Medications. It is not advisable to combine anxiety,sleep and pain medications without talking with your primary care practitioner  Special Instructions: If you have smoked or chewed Tobacco  in the last 2 yrs please stop smoking, stop any regular Alcohol  and or any Recreational drug use.  Wear Seat belts while driving.  Please note: You were cared for by a hospitalist during your hospital stay. Once you are discharged, your primary care physician will handle any further medical issues. Please note that NO REFILLS for any discharge medications will be authorized once you are discharged, as it is imperative that you return to your primary care physician (or establish a  relationship with a primary care physician if you do not have one) for your post hospital discharge needs so that they can reassess your need for medications and monitor your lab values.   Increase activity slowly   Complete by: As directed    No wound care   Complete by: As directed       Allergies as of 05/10/2023       Reactions   Nebivolol Swelling   Chest pain (reaction to Bystolic)   Zestril [lisinopril] Swelling, Other (See Comments)   Ace Inhibitors Swelling, Other (See Comments)   Tongue swell   Morphine And Codeine Itching        Medication List     STOP taking these medications    ipratropium 0.06 % nasal spray Commonly known as: ATROVENT   oxycodone 5 MG capsule Commonly known as: OXY-IR   polyethylene glycol 17 g packet Commonly known as: MIRALAX / GLYCOLAX       TAKE these medications    acetaminophen 500 MG tablet Commonly known as: TYLENOL Take 1,000 mg by mouth See admin instructions. Take 1,000mg  (2 tablets) by mouth twice a day in addition to 1,000mg  (2 tablets) twice daily as needed for pain.  albuterol (2.5 MG/3ML) 0.083% nebulizer solution Commonly known as: PROVENTIL Take 3 mLs (2.5 mg total) by nebulization every 6 (six) hours as needed for wheezing or shortness of breath.   albuterol 108 (90 Base) MCG/ACT inhaler Commonly known as: VENTOLIN HFA Inhale 2 puffs into the lungs every 6 (six) hours as needed for shortness of breath (COPD).   allopurinol 100 MG tablet Commonly known as: ZYLOPRIM Take 1 tablet (100 mg total) by mouth daily.   budesonide-formoterol 160-4.5 MCG/ACT inhaler Commonly known as: SYMBICORT Inhale 2 puffs into the lungs in the morning and at bedtime.   carboxymethylcellulose 0.5 % Soln Commonly known as: REFRESH PLUS Place 1 drop into both eyes 3 (three) times daily as needed (dry/irritated eyes.).   carvedilol 6.25 MG tablet Commonly known as: COREG Take 1 tablet (6.25 mg total) by mouth 2 (two) times daily  with a meal.   cetirizine 10 MG tablet Commonly known as: ZYRTEC Take 10 mg by mouth 2 (two) times daily.   clopidogrel 75 MG tablet Commonly known as: PLAVIX Take 75 mg by mouth daily.   diclofenac Sodium 1 % Gel Commonly known as: VOLTAREN Apply 2 g topically 4 (four) times daily.   Docusate Sodium 100 MG capsule Take 100 mg by mouth daily.   feeding supplement (NEPRO CARB STEADY) Liqd Take 237 mLs by mouth 2 (two) times daily between meals.   fluticasone 50 MCG/ACT nasal spray Commonly known as: FLONASE Place 1 spray into both nostrils daily.   Incruse Ellipta 62.5 MCG/ACT Aepb Generic drug: umeclidinium bromide Inhale 1 puff into the lungs daily.   insulin aspart 100 UNIT/ML injection Commonly known as: novoLOG Inject 0-6 Units into the skin 3 (three) times daily with meals. What changed:  how much to take when to take this   levothyroxine 175 MCG tablet Commonly known as: SYNTHROID Take 175 mcg by mouth daily.   lidocaine 4 % Place 2 patches onto the skin daily. Each lower side of back.   lidocaine-prilocaine cream Commonly known as: EMLA Apply 1 Application topically See admin instructions. On dialysis days (Tues,Thurs,Sat)   multivitamins with iron Tabs tablet Take 1 tablet by mouth in the morning.   OXYGEN Inhale into the lungs.   pantoprazole 40 MG tablet Commonly known as: PROTONIX Take 1 tablet (40 mg total) by mouth 2 (two) times daily before a meal.   predniSONE 10 MG tablet Commonly known as: DELTASONE Prednisone 20 mg p.o. daily on 8/3, and then prednisone 10 mg p.o. daily x 1 dose on 8/4 and then discontinue. Start taking on: May 11, 2023   sevelamer carbonate 800 MG tablet Commonly known as: RENVELA Take 2 tablets (1,600 mg total) by mouth 3 (three) times daily with meals.   thiamine 100 MG tablet Commonly known as: Vitamin B-1 Take 100 mg by mouth in the morning.        Allergies  Allergen Reactions   Nebivolol Swelling     Chest pain (reaction to Bystolic)   Zestril [Lisinopril] Swelling and Other (See Comments)   Ace Inhibitors Swelling and Other (See Comments)    Tongue swell   Morphine And Codeine Itching     Other Procedures/Studies: CT Angio Chest PE W and/or Wo Contrast  Result Date: 05/06/2023 CLINICAL DATA:  Short of breath, chest pain EXAM: CT ANGIOGRAPHY CHEST WITH CONTRAST TECHNIQUE: Multidetector CT imaging of the chest was performed using the standard protocol during bolus administration of intravenous contrast. Multiplanar CT image reconstructions and MIPs were  obtained to evaluate the vascular anatomy. RADIATION DOSE REDUCTION: This exam was performed according to the departmental dose-optimization program which includes automated exposure control, adjustment of the mA and/or kV according to patient size and/or use of iterative reconstruction technique. CONTRAST:  75mL OMNIPAQUE IOHEXOL 350 MG/ML SOLN COMPARISON:  05/06/2023, 11/09/2022 FINDINGS: Cardiovascular: This is a technically adequate evaluation of the pulmonary vasculature. No filling defects or pulmonary emboli. The heart is enlarged without pericardial effusion. No evidence of thoracic aortic aneurysm or dissection. Atherosclerosis of the aorta and coronary vasculature. Mediastinum/Nodes: No enlarged mediastinal, hilar, or axillary lymph nodes. Thyroid gland, trachea, and esophagus demonstrate no significant findings. Lungs/Pleura: Left pleural effusion with dense left lower lobe consolidation consistent with compressive atelectasis. This accounts for the density seen at the left lung base on chest x-ray. Central vascular prominence. No airspace disease or pneumothorax. Dependent hypoventilatory changes right lower lobe. Central airways are patent. Upper Abdomen: No acute abnormality. Musculoskeletal: Multiple chronic healed bilateral rib fractures. No acute bony abnormality. Reconstructed images demonstrate no additional findings. Review of the  MIP images confirms the above findings. IMPRESSION: 1. No evidence of pulmonary embolus. 2. Cardiomegaly and central pulmonary vascular congestion without overt edema. 3. Left pleural effusion, with compressive left lower lobe atelectasis. 4. Aortic Atherosclerosis (ICD10-I70.0). Coronary artery atherosclerosis. Electronically Signed   By: Sharlet Salina M.D.   On: 05/06/2023 23:03   DG Chest Port 1 View  Result Date: 05/06/2023 CLINICAL DATA:  Short of breath EXAM: PORTABLE CHEST 1 VIEW COMPARISON:  04/13/2023 FINDINGS: Single frontal view of the chest demonstrates marked enlargement of the cardiac silhouette. Stable atherosclerosis of the aorta. Increased central vascular congestion, with increased interstitial prominence concerning for edema. Retrocardiac density may reflect left basilar consolidation or effusion. No pneumothorax. IMPRESSION: 1. Constellation of findings most consistent with congestive heart failure. 2. Increased density left lung base consistent with consolidation and effusion. Favor atelectasis over pneumonia. Electronically Signed   By: Sharlet Salina M.D.   On: 05/06/2023 20:50   ECHOCARDIOGRAM COMPLETE  Result Date: 04/15/2023    ECHOCARDIOGRAM REPORT   Patient Name:   [name] [name] Date of Exam: 04/15/2023 Medical Rec #:  [id number]         Height:       63.0 in Accession #:    [id number]        Weight:       226.6 lb Date of Birth:  February 02, 1951          BSA:          2.039 m Patient Age:    72 years          BP:           118/50 mmHg Patient Gender: F                 HR:           113 bpm. Exam Location:  Inpatient Procedure: 2D Echo, Cardiac Doppler, Color Doppler and Intracardiac            Opacification Agent Indications:    CHF-Acute Diastolic I50.31  History:        Patient has prior history of Echocardiogram examinations, most                 recent 11/22/2021. CHF, PAD, TIA and COPD, Signs/Symptoms:Chest                 Pain and Syncope; Risk Factors:Hypertension, Sleep Apnea,  Diabetes, Dyslipidemia and Current Smoker. ESRD.  Sonographer:    Lucendia Herrlich Referring Phys: [id number] PRATIK D [facility] IMPRESSIONS  1. Left ventricular ejection fraction, by estimation, is 35 to 40%. The left ventricle has moderately decreased function. The left ventricle demonstrates global hypokinesis. The left ventricular internal cavity size was mildly dilated. Left ventricular diastolic parameters are consistent with Grade I diastolic dysfunction (impaired relaxation).  2. Right ventricular systolic function is normal. The right ventricular size is normal. There is moderately elevated pulmonary artery systolic pressure. The estimated right ventricular systolic pressure is 47.3 mmHg.  3. Left atrial size was mildly dilated.  4. The mitral valve is normal in structure. Trivial mitral valve regurgitation. No evidence of mitral stenosis.  5. The aortic valve is tricuspid. Aortic valve regurgitation is not visualized. No aortic stenosis is present. Aortic valve mean gradient measures 7.0 mmHg.  6. The inferior vena cava is dilated in size with <50% respiratory variability, suggesting right atrial pressure of 15 mmHg. FINDINGS  Left Ventricle: Left ventricular ejection fraction, by estimation, is 35 to 40%. The left ventricle has moderately decreased function. The left ventricle demonstrates global hypokinesis. Definity contrast agent was given IV to delineate the left ventricular endocardial borders. The left ventricular internal cavity size was mildly dilated. There is no left ventricular hypertrophy. Left ventricular diastolic parameters are consistent with Grade I diastolic dysfunction (impaired relaxation). Right Ventricle: The right ventricular size is normal. No increase in right ventricular wall thickness. Right ventricular systolic function is normal. There is moderately elevated pulmonary artery systolic pressure. The tricuspid regurgitant velocity is 2.84 m/s, and with an assumed right  atrial pressure of 15 mmHg, the estimated right ventricular systolic pressure is 47.3 mmHg. Left Atrium: Left atrial size was mildly dilated. Right Atrium: Right atrial size was normal in size. Pericardium: There is no evidence of pericardial effusion. Mitral Valve: The mitral valve is normal in structure. Trivial mitral valve regurgitation. No evidence of mitral valve stenosis. Tricuspid Valve: The tricuspid valve is normal in structure. Tricuspid valve regurgitation is trivial. Aortic Valve: The aortic valve is tricuspid. Aortic valve regurgitation is not visualized. No aortic stenosis is present. Aortic valve mean gradient measures 7.0 mmHg. Aortic valve peak gradient measures 13.4 mmHg. Aortic valve area, by VTI measures 2.52  cm. Pulmonic Valve: The pulmonic valve was normal in structure. Pulmonic valve regurgitation is not visualized. Aorta: The aortic root is normal in size and structure. Venous: The inferior vena cava is dilated in size with less than 50% respiratory variability, suggesting right atrial pressure of 15 mmHg. IAS/Shunts: No atrial level shunt detected by color flow Doppler.  LEFT VENTRICLE PLAX 2D LVIDd:         5.90 cm   Diastology LVIDs:         5.00 cm   LV e' medial:    6.64 cm/s LV PW:         1.10 cm   LV E/e' medial:  13.4 LV IVS:        0.90 cm   LV e' lateral:   8.05 cm/s LVOT diam:     2.00 cm   LV E/e' lateral: 11.1 LV SV:         81 LV SV Index:   40 LVOT Area:     3.14 cm  RIGHT VENTRICLE             IVC RV S prime:     12.10 cm/s  IVC diam: 2.30 cm TAPSE (M-mode):  2.1 cm LEFT ATRIUM           Index        RIGHT ATRIUM           Index LA diam:      4.90 cm 2.40 cm/m   RA Area:     13.50 cm LA Vol (A2C): 50.1 ml 24.57 ml/m  RA Volume:   31.40 ml  15.40 ml/m LA Vol (A4C): 78.0 ml 38.25 ml/m  AORTIC VALVE AV Area (Vmax):    2.48 cm AV Area (Vmean):   2.45 cm AV Area (VTI):     2.52 cm AV Vmax:           183.00 cm/s AV Vmean:          125.000 cm/s AV VTI:            0.322 m  AV Peak Grad:      13.4 mmHg AV Mean Grad:      7.0 mmHg LVOT Vmax:         144.67 cm/s LVOT Vmean:        97.333 cm/s LVOT VTI:          0.258 m LVOT/AV VTI ratio: 0.80  AORTA Ao Root diam: 3.00 cm Ao Asc diam:  3.10 cm MITRAL VALVE                TRICUSPID VALVE MV Area (PHT): 4.49 cm     TR Peak grad:   32.3 mmHg MV Decel Time: 169 msec     TR Vmax:        284.00 cm/s MV E velocity: 89.30 cm/s MV A velocity: 108.00 cm/s  SHUNTS MV E/A ratio:  0.83         Systemic VTI:  0.26 m                             Systemic Diam: 2.00 cm Dalton McleanMD Electronically signed by Wilfred Lacy Signature Date/Time: 04/15/2023/5:22:34 PM    Final    DG Chest Portable 1 View  Result Date: 04/13/2023 CLINICAL DATA:  Chest pain shortness of breath. EXAM: PORTABLE CHEST 1 VIEW COMPARISON:  08/08/2022 FINDINGS: The cardio pericardial silhouette is enlarged. There is pulmonary vascular congestion without overt pulmonary edema. Interstitial pulmonary edema towards the bases cannot be excluded. No focal consolidation or substantial pleural effusion evident. Retrocardiac opacity is likely atelectatic. Bones are diffusely demineralized. Telemetry leads overlie the chest. IMPRESSION: 1. Enlargement of the cardiopericardial silhouette with pulmonary vascular congestion. Dependent interstitial edema cannot be excluded. 2. Retrocardiac opacity is likely atelectatic. Electronically Signed   By: Kennith Center M.D.   On: 04/13/2023 12:31     TODAY-DAY OF DISCHARGE:  Subjective:   [name] [name] today has no headache,no chest abdominal pain,no new weakness tingling or numbness, feels much better wants to go home today.   Objective:   Blood pressure (!) 104/58, pulse 95, temperature 98.3 F (36.8 C), temperature source Oral, resp. rate 20, height 5\' 3"  (1.6 m), weight 101.8 kg, SpO2 96%.  Intake/Output Summary (Last 24 hours) at 05/10/2023 0929 Last data filed at 05/09/2023 2114 Gross per 24 hour  Intake 358 ml  Output 2.5 ml   Net 355.5 ml   Filed Weights   05/09/23 1013 05/09/23 1433 05/10/23 0029  Weight: (S) 99.7 kg 97.3 kg 101.8 kg    Exam: Awake Alert, Oriented *3, No new F.N deficits, Normal affect [place].AT,PERRAL  Supple Neck,No JVD, No cervical lymphadenopathy appriciated.  Symmetrical Chest wall movement, Good air movement bilaterally, CTAB RRR,No Gallops,Rubs or new Murmurs, No Parasternal Heave +ve B.Sounds, Abd Soft, Non tender, No organomegaly appriciated, No rebound -guarding or rigidity. No Cyanosis, Clubbing or edema, No new Rash or bruise   PERTINENT RADIOLOGIC STUDIES: No results found.   PERTINENT LAB RESULTS: CBC: Recent Labs    05/08/23 0057 05/09/23 0527  WBC 12.0* 7.9  HGB 10.0* 9.9*  HCT 34.4* 33.7*  PLT 230 269   CMET CMP     Component Value Date/Time   NA 130 (L) 05/09/2023 0527   NA 146 (H) 12/23/2013 1132   K 4.3 05/09/2023 0527   CL 88 (L) 05/09/2023 0527   CO2 25 05/09/2023 0527   GLUCOSE 159 (H) 05/09/2023 0527   BUN 68 (H) 05/09/2023 0527   BUN 31 (H) 12/23/2013 1132   CREATININE 7.06 (H) 05/09/2023 0527   CREATININE 4.65 (HH) 11/09/2022 1000   CREATININE 1.32 (H) 06/29/2014 1100   CALCIUM 8.8 (L) 05/09/2023 0527   CALCIUM 9.1 09/07/2021 1036   PROT 6.8 05/06/2023 2111   PROT 7.3 12/23/2013 1132   ALBUMIN 3.2 (L) 05/08/2023 0058   ALBUMIN 4.1 12/23/2013 1132   AST 23 05/06/2023 2111   AST 14 (L) 11/09/2022 1000   ALT 14 05/06/2023 2111   ALT 9 11/09/2022 1000   ALKPHOS 126 05/06/2023 2111   BILITOT 0.6 05/06/2023 2111   BILITOT 0.3 11/09/2022 1000   GFRNONAA 6 (L) 05/09/2023 0527   GFRNONAA 9 (L) 11/09/2022 1000    GFR Estimated Creatinine Clearance: 8.2 mL/min (A) (by C-G formula based on SCr of 7.06 mg/dL (H)). No results for input(s): "LIPASE", "AMYLASE" in the last 72 hours. No results for input(s): "CKTOTAL", "CKMB", "CKMBINDEX", "TROPONINI" in the last 72 hours. Invalid input(s): "POCBNP" No results for input(s): "DDIMER" in the last  72 hours. No results for input(s): "HGBA1C" in the last 72 hours. No results for input(s): "CHOL", "HDL", "LDLCALC", "TRIG", "CHOLHDL", "LDLDIRECT" in the last 72 hours. No results for input(s): "TSH", "T4TOTAL", "T3FREE", "THYROIDAB" in the last 72 hours.  Invalid input(s): "FREET3" No results for input(s): "VITAMINB12", "FOLATE", "FERRITIN", "TIBC", "IRON", "RETICCTPCT" in the last 72 hours. Coags: No results for input(s): "INR" in the last 72 hours.  Invalid input(s): "PT" Microbiology: Recent Results (from the past 240 hour(s))  SARS Coronavirus 2 by RT PCR (hospital order, performed in [facility] hospital lab) *cepheid single result test* Anterior Nasal Swab     Status: None   Collection Time: 05/06/23  7:38 PM   Specimen: Anterior Nasal Swab  Result Value Ref Range Status   SARS Coronavirus 2 by RT PCR NEGATIVE NEGATIVE Final    Comment: Performed at [facility], [address], [place], [place] [address]    FURTHER DISCHARGE INSTRUCTIONS:  Get Medicines reviewed and adjusted: Please take all your medications with you for your next visit with your Primary MD  Laboratory/radiological data: Please request your Primary MD to go over all hospital tests and procedure/radiological results at the follow up, please ask your Primary MD to get all Hospital records sent to his/her office.  In some cases, they will be blood work, cultures and biopsy results pending at the time of your discharge. Please request that your primary care M.D. goes through all the records of your hospital data and follows up on these results.  Also Note the following: If you experience worsening of your admission symptoms,  develop shortness of breath, life threatening emergency, suicidal or homicidal thoughts you must seek medical attention immediately by calling 911 or calling your MD immediately  if symptoms less severe.  You must read complete instructions/literature along with all the possible  adverse reactions/side effects for all the Medicines you take and that have been prescribed to you. Take any new Medicines after you have completely understood and accpet all the possible adverse reactions/side effects.   Do not drive when taking Pain medications or sleeping medications (Benzodaizepines)  Do not take more than prescribed Pain, Sleep and Anxiety Medications. It is not advisable to combine anxiety,sleep and pain medications without talking with your primary care practitioner  Special Instructions: If you have smoked or chewed Tobacco  in the last 2 yrs please stop smoking, stop any regular Alcohol  and or any Recreational drug use.  Wear Seat belts while driving.  Please note: You were cared for by a hospitalist during your hospital stay. Once you are discharged, your primary care physician will handle any further medical issues. Please note that NO REFILLS for any discharge medications will be authorized once you are discharged, as it is imperative that you return to your primary care physician (or establish a relationship with a primary care physician if you do not have one) for your post hospital discharge needs so that they can reassess your need for medications and monitor your lab values.  Total Time spent coordinating discharge including counseling, education and face to face time equals greater than 30 minutes.  SignedJeoffrey Massed 05/10/2023 9:29 AM

## 2023-05-11 DIAGNOSIS — G894 Chronic pain syndrome: Secondary | ICD-10-CM | POA: Diagnosis not present

## 2023-05-11 DIAGNOSIS — J9622 Acute and chronic respiratory failure with hypercapnia: Secondary | ICD-10-CM | POA: Diagnosis not present

## 2023-05-11 DIAGNOSIS — J441 Chronic obstructive pulmonary disease with (acute) exacerbation: Secondary | ICD-10-CM | POA: Diagnosis not present

## 2023-05-11 DIAGNOSIS — I5043 Acute on chronic combined systolic (congestive) and diastolic (congestive) heart failure: Secondary | ICD-10-CM | POA: Diagnosis not present

## 2023-05-11 LAB — CBC
HCT: 32.4 % — ABNORMAL LOW (ref 36.0–46.0)
Hemoglobin: 9.6 g/dL — ABNORMAL LOW (ref 12.0–15.0)
MCH: 30.4 pg (ref 26.0–34.0)
MCHC: 29.6 g/dL — ABNORMAL LOW (ref 30.0–36.0)
MCV: 102.5 fL — ABNORMAL HIGH (ref 80.0–100.0)
Platelets: 248 10*3/uL (ref 150–400)
RBC: 3.16 MIL/uL — ABNORMAL LOW (ref 3.87–5.11)
RDW: 15.1 % (ref 11.5–15.5)
WBC: 8.3 10*3/uL (ref 4.0–10.5)
nRBC: 1.2 % — ABNORMAL HIGH (ref 0.0–0.2)

## 2023-05-11 LAB — RENAL FUNCTION PANEL
Albumin: 3.1 g/dL — ABNORMAL LOW (ref 3.5–5.0)
Anion gap: 13 (ref 5–15)
BUN: 76 mg/dL — ABNORMAL HIGH (ref 8–23)
CO2: 26 mmol/L (ref 22–32)
Calcium: 9.3 mg/dL (ref 8.9–10.3)
Chloride: 91 mmol/L — ABNORMAL LOW (ref 98–111)
Creatinine, Ser: 6.71 mg/dL — ABNORMAL HIGH (ref 0.44–1.00)
GFR, Estimated: 6 mL/min — ABNORMAL LOW (ref >60)
Glucose, Bld: 128 mg/dL — ABNORMAL HIGH (ref 70–99)
Phosphorus: 4.5 mg/dL (ref 2.5–4.6)
Potassium: 4.8 mmol/L (ref 3.5–5.1)
Sodium: 130 mmol/L — ABNORMAL LOW (ref 135–145)

## 2023-05-11 LAB — BASIC METABOLIC PANEL
Anion gap: 10 (ref 5–15)
BUN: 76 mg/dL — ABNORMAL HIGH (ref 8–23)
CO2: 28 mmol/L (ref 22–32)
Calcium: 9.4 mg/dL (ref 8.9–10.3)
Chloride: 92 mmol/L — ABNORMAL LOW (ref 98–111)
Creatinine, Ser: 6.81 mg/dL — ABNORMAL HIGH (ref 0.44–1.00)
GFR, Estimated: 6 mL/min — ABNORMAL LOW (ref >60)
Glucose, Bld: 130 mg/dL — ABNORMAL HIGH (ref 70–99)
Potassium: 4.8 mmol/L (ref 3.5–5.1)
Sodium: 130 mmol/L — ABNORMAL LOW (ref 135–145)

## 2023-05-11 LAB — MAGNESIUM: Magnesium: 2 mg/dL (ref 1.7–2.4)

## 2023-05-11 LAB — GLUCOSE, CAPILLARY
Glucose-Capillary: 115 mg/dL — ABNORMAL HIGH (ref 70–99)
Glucose-Capillary: 289 mg/dL — ABNORMAL HIGH (ref 70–99)
Glucose-Capillary: 285 mg/dL — ABNORMAL HIGH (ref 70–99)

## 2023-05-11 MED ORDER — ANTICOAGULANT SODIUM CITRATE 4% (200MG/5ML) IV SOLN: 5.0000 mL | Status: DC | PRN

## 2023-05-11 MED ORDER — LIDOCAINE-PRILOCAINE 2.5-2.5 % EX CREA: 1.0000 | TOPICAL_CREAM | CUTANEOUS | Status: DC | PRN

## 2023-05-11 MED ORDER — LIDOCAINE HCL (PF) 1 % IJ SOLN: 5.0000 mL | INTRAMUSCULAR | Status: DC | PRN

## 2023-05-11 MED ORDER — HEPARIN SODIUM (PORCINE) 1000 UNIT/ML DIALYSIS
2000.0000 [IU] | Freq: Once | INTRAMUSCULAR | Status: AC
  Administered 2023-05-11: 2000 [IU] via INTRAVENOUS_CENTRAL
  Filled 2023-05-11: qty 2

## 2023-05-11 MED ORDER — ALTEPLASE 2 MG IJ SOLR: 2.0000 mg | Freq: Once | INTRAMUSCULAR | Status: DC | PRN

## 2023-05-11 MED ORDER — NEPRO/CARBSTEADY PO LIQD: 237.0000 mL | ORAL | Status: DC | PRN

## 2023-05-11 MED ORDER — PENTAFLUOROPROP-TETRAFLUOROETH EX AERO: 1.0000 | INHALATION_SPRAY | CUTANEOUS | Status: DC | PRN

## 2023-05-11 MED ORDER — ONDANSETRON HCL 4 MG/2ML IJ SOLN
4.0000 mg | Freq: Four times a day (QID) | INTRAMUSCULAR | Status: DC | PRN
  Administered 2023-05-11: 4 mg via INTRAVENOUS
  Filled 2023-05-11: qty 2

## 2023-05-11 MED ORDER — HEPARIN SODIUM (PORCINE) 1000 UNIT/ML DIALYSIS: 1000.0000 [IU] | INTRAMUSCULAR | Status: DC | PRN

## 2023-05-11 NOTE — Progress Notes (Signed)
The  patient had 8 beats of VT this morning. She was asymptomatic on reassessment. Notified Dr. Loney Loh without any new order. Will continue to monitor.

## 2023-05-11 NOTE — Progress Notes (Signed)
   05/11/23 2104  BiPAP/CPAP/SIPAP  Reason BIPAP/CPAP not in use Non-compliant (Refused)

## 2023-05-11 NOTE — Plan of Care (Signed)
  Problem: Activity: Goal: Will verbalize the importance of balancing activity with adequate rest periods Outcome: Progressing   Problem: Respiratory: Goal: Ability to maintain a clear airway will improve Outcome: Progressing   Problem: Skin Integrity: Goal: Risk for impaired skin integrity will decrease Outcome: Progressing

## 2023-05-11 NOTE — Plan of Care (Signed)
  Problem: Education: Goal: Knowledge of disease or condition will improve Outcome: Progressing   Problem: Respiratory: Goal: Ability to maintain a clear airway will improve Outcome: Progressing Goal: Levels of oxygenation will improve Outcome: Progressing   Problem: Clinical Measurements: Goal: Ability to maintain clinical measurements within normal limits will improve Outcome: Progressing Goal: Will remain free from infection Outcome: Progressing Goal: Respiratory complications will improve Outcome: Progressing Goal: Cardiovascular complication will be avoided Outcome: Progressing

## 2023-05-11 NOTE — Progress Notes (Signed)
Received patient in bed to unit.  Alert and oriented.  Informed consent signed and in chart.   TX duration:  Patient tolerated well.  Transported back to the room  Alert, without acute distress.  Hand-off given to patient's nurse.   Access used: Left upper arm graft Access issues: none  Total UF removed: Medication(s) given: Tylenol   05/11/23 1224  Vitals  Temp 97.6 F (36.4 C)  Temp Source Oral  BP 122/63  MAP (mmHg) 79  Pulse Rate 85  ECG Heart Rate 88  Resp (!) 21  Oxygen Therapy  SpO2 100 %  O2 Device Nasal Cannula  O2 Flow Rate (L/min) 3 L/min  During Treatment Monitoring  HD Safety Checks Performed Yes  Intra-Hemodialysis Comments Tx completed;Tolerated well  Fistula / Graft Left Upper arm Arteriovenous vein graft  Placement Date/Time: 12/01/21 1444   Placed prior to admission: No  Orientation: Left  Access Location: Upper arm  Access Type: Arteriovenous vein graft  Site Condition No complications  Fistula / Graft Assessment Present;Thrill;Bruit  Status Deaccessed;Patent  Drainage Description None     Stacie Glaze LPN [facility]

## 2023-05-11 NOTE — Progress Notes (Signed)
PROGRESS NOTE        PATIENT DETAILS Name: [name] [name] Age: 72 y.o. Sex: female Date of Birth: 08-30-1951 Admit Date: 05/06/2023 Admitting Physician Briscoe Deutscher, MD WUJ:WJXB, [name], FNP  Brief Summary: Patient is a 72 y.o.  female with history of COPD, non-small cell cancer-s/p curative radiation (completed April 06, 2022), ESRD on HD, HTN, DM-2, chronic combined systolic/diastolic heart failure, OSA with CPAP intolerance-presented with worsening shortness of breath-found to have COPD exacerbation with worsening hypercarbia-subsequently admitted to the hospitalist service.  See below for further details.  Significant events: 7/29>> admit to [facility]  Significant studies: 7/29>> CT angio chest: No PE.  Vascular congestion but no overt edema.  Significant microbiology data: 7/29>> COVID PCR: Negative  Procedures: None  Consults: Nephrology  Subjective: No major issues overnight-no chest pain or shortness of breath-lying comfortably in bed.  Objective: Vitals: Blood pressure 122/67, pulse 80, temperature 97.8 F (36.6 C), resp. rate 16, height 5\' 3"  (1.6 m), weight 101.1 kg, SpO2 100%.   Exam: Gen Exam:Alert awake-not in any distress HEENT:atraumatic, normocephalic Chest: B/L clear to auscultation anteriorly CVS:S1S2 regular Abdomen:soft non tender, non distended Extremities:no edema Neurology: Non focal-has chronic lower extremity weakness-at baseline. 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  Acute on chronic combined diastolic/systolic heart failure in the setting of ESRD Improved after volume removal with hemodialysis.  ESRD on HD TTS Nephrology following  OSA Intolerant to CPAP On home oxygen  History of lung cancer-s/p curative radiation Resume outpatient follow-up with radiation/medical oncology  History of PAD Plavix  Hypothyroidism Synthroid  Uncontrolled DM-2-with steroid-induced hyperglycemia (A1c 7.1 on 7/6) CBGs stable-still on the higher side-prednisone now discontinued-reassess on 8/4. Continue SSI   Recent Labs    05/10/23 1205 05/10/23 1630 05/10/23 2154  GLUCAP 293* 339* 211*    Debility/deconditioning Worsened by acute illness PT/OT eval-SNF recommended.  Insurance authorization in progress.  Obesity: Estimated body mass index is 39.48 kg/m as calculated from the following:   Height as of this encounter: 5\' 3"  (1.6 m).   Weight as of this encounter: 101.1 kg.   Code status:   Code Status: Full Code   DVT Prophylaxis: heparin injection 5,000 Units Start: 05/07/23 1400   Family Communication: Son at bedside on 7/31-no one at bedside this morning.   Disposition Plan: Status is: Inpatient Remains inpatient appropriate  because: Severity of illness    Planned Discharge Destination:Skilled nursing facility   Diet: Diet Order             Diet - low sodium heart healthy           Diet general           Diet renal with fluid restriction Fluid restriction: 1200 mL Fluid; Room service appropriate? Yes; Fluid consistency: Thin  Diet effective now                     Antimicrobial agents: Anti-infectives  (From admission, onward)    Start     Dose/Rate Route Frequency Ordered Stop   05/08/23 0800  azithromycin (ZITHROMAX) tablet 500 mg       Placed in "Followed by" Linked Group   500 mg Oral Daily 05/07/23 0307 05/12/23 0959   05/07/23 0315  azithromycin (ZITHROMAX) 500 mg in sodium chloride 0.9 % 250 mL IVPB       Placed in "Followed by" Linked Group   500 mg 250 mL/hr over 60 Minutes Intravenous Every 24 hours 05/07/23 0307 05/07/23 0341        MEDICATIONS: Scheduled Meds:  allopurinol  100 mg Oral Daily   arformoterol  15 mcg Nebulization BID   azithromycin  500 mg Oral Daily   budesonide (PULMICORT) nebulizer solution  0.25 mg Nebulization BID   carvedilol  6.25 mg Oral BID WC   Chlorhexidine Gluconate Cloth  6 each Topical Q0600   clopidogrel  75 mg Oral Daily   doxercalciferol  3 mcg Intravenous Q T,Th,Sa-HD   heparin  5,000 Units Subcutaneous Q8H   insulin aspart  0-5 Units Subcutaneous QHS   insulin aspart  0-9 Units Subcutaneous TID WC   levothyroxine  175 mcg Oral Daily   pantoprazole  40 mg Oral BID AC   predniSONE  30 mg Oral Q breakfast   revefenacin  175 mcg Nebulization Daily   sevelamer carbonate  1,600 mg Oral TID WC   sodium chloride flush  3 mL Intravenous Q12H   Continuous Infusions:  anticoagulant sodium citrate     PRN Meds:.acetaminophen **OR** acetaminophen, albuterol, alteplase, anticoagulant sodium citrate, feeding supplement (NEPRO CARB STEADY), heparin, lidocaine (PF), lidocaine-prilocaine, pentafluoroprop-tetrafluoroeth   I have personally reviewed following labs and imaging studies  LABORATORY DATA: CBC: Recent Labs  Lab 05/06/23 2111 05/07/23 0050 05/07/23 0342 05/07/23 0619 05/08/23 0057 05/09/23 0527 05/11/23 0614  WBC 10.3  --   --  13.4* 12.0* 7.9 8.3  NEUTROABS 8.6*  --   --   --   --   --   --   HGB 10.2*   < > 11.9* 10.5* 10.0* 9.9* 9.6*  HCT 35.7*   < > 35.0* 36.8 34.4* 33.7* 32.4*  MCV 105.9*  --   --  105.7* 102.7*  103.1* 102.5*  PLT 208  --   --  214 230 269 248   < > = values in this interval not displayed.    Basic Metabolic Panel: Recent Labs  Lab 05/07/23 0619 05/07/23 2109 05/08/23 0057 05/08/23 0058 05/09/23 0527 05/11/23 0614  NA 131* 132*  --  132* 130* 130*  130*  K 5.7* 3.8  --  4.0 4.3 4.8  4.8  CL 90* 90*  --  90* 88* 91*  92*  CO2 28 25  --  28 25 26  28   GLUCOSE 206* 177*  --  174* 159* 128*  130*  BUN 64* 37*  --  40* 68* 76*  76*  CREATININE 7.58* 4.99*  --  5.11* 7.06* 6.71*  6.81*  CALCIUM 9.0 8.9  --  8.9 8.8* 9.3  9.4  MG  --  1.7 2.1  --   --  2.0  PHOS  --   --   --  5.2*  --  4.5    GFR: Estimated Creatinine Clearance: 8.5 mL/min (A) (by C-G formula based on SCr of 6.81 mg/dL (H)).  Liver Function Tests: Recent Labs  Lab 05/06/23 2111 05/08/23 0058 05/11/23 0614  AST 23  --   --   ALT 14  --   --   ALKPHOS 126  --   --   BILITOT 0.6  --   --   PROT 6.8  --   --   ALBUMIN 3.3* 3.2* 3.1*   No results for input(s): "LIPASE", "AMYLASE" in the last 168 hours. No results for input(s): "AMMONIA" in the last 168 hours.  Coagulation Profile: No results for input(s): "INR", "PROTIME" in the last 168 hours.  Cardiac Enzymes: No results for input(s): "CKTOTAL", "CKMB", "CKMBINDEX", "TROPONINI" in the last 168 hours.  BNP (last 3 results) No results for input(s): "PROBNP" in the last 8760 hours.  Lipid Profile: No results for input(s): "CHOL", "HDL", "LDLCALC", "TRIG", "CHOLHDL", "LDLDIRECT" in the last 72 hours.  Thyroid Function Tests: No results for input(s): "TSH", "T4TOTAL", "FREET4", "T3FREE", "THYROIDAB" in the last 72 hours.  Anemia Panel: No results for input(s): "VITAMINB12", "FOLATE", "FERRITIN", "TIBC", "IRON", "RETICCTPCT" in the last 72 hours.  Urine analysis:    Component Value Date/Time   COLORURINE YELLOW 11/27/2021 0956   APPEARANCEUR HAZY (A) 11/27/2021 0956   LABSPEC 1.012 11/27/2021 0956   PHURINE 5.0 11/27/2021 0956    GLUCOSEU NEGATIVE 11/27/2021 0956   HGBUR NEGATIVE 11/27/2021 0956   BILIRUBINUR NEGATIVE 11/27/2021 0956   KETONESUR NEGATIVE 11/27/2021 0956   PROTEINUR 30 (A) 11/27/2021 0956   UROBILINOGEN 0.2 08/04/2014 1409   NITRITE NEGATIVE 11/27/2021 0956   LEUKOCYTESUR NEGATIVE 11/27/2021 0956    Sepsis Labs: Lactic Acid, Venous    Component Value Date/Time   LATICACIDVEN 0.6 05/07/2023 0258    MICROBIOLOGY: Recent Results (from the past 240 hour(s))  SARS Coronavirus 2 by RT PCR (hospital order, performed in [facility] hospital lab) *cepheid single result test* Anterior Nasal Swab     Status: None   Collection Time: 05/06/23  7:38 PM   Specimen: Anterior Nasal Swab  Result Value Ref Range Status   SARS Coronavirus 2 by RT PCR NEGATIVE NEGATIVE Final    Comment: Performed at [facility], [address] [place], [place] [address]    RADIOLOGY STUDIES/RESULTS: No results found.   LOS: 4 days   Jeoffrey Massed, MD  [facility]    To contact the attending provider between 7A-7P or the covering provider during after hours 7P-7A, please log into the web site [email or web address] and access using universal [place] password for that web site. If you do not have the password, please call the hospital operator.  05/11/2023, 11:28 AM

## 2023-05-11 NOTE — Progress Notes (Signed)
Subjective: seen at HD this AM, d/c delayed due to insurance approval to SNF.  Objective Vital signs in last 24 hours: Vitals:   05/11/23 0500 05/11/23 0750 05/11/23 0754 05/11/23 0800  BP:   135/61 (!) 120/55  Pulse: 88  89 80  Resp: 16  18 (!) 21  Temp:   97.8 F (36.6 C)   TempSrc:      SpO2: 97%  100% 100%  Weight: 101.7 kg 101.1 kg    Height:       Weight change: 2 kg  PPhysical Exam: General:  Alert  elderly female chronically ill appearing  NAD, O x3  Heart: RRR , no MRG Lungs: L sided  exp wheezing  ,non labored on [place] o2 3L   Abdomen: Obese, NABS , Soft , NT  abd wall edema  Extremities: trace bipedal edema    Dialysis Access: LUA AVG patent on hd       OP Dialysis Orders:  [facility] , TTS 4 hours , EDW 100.8 kg  2K 2 Ca bath, LUA AVG heparin 2000 units  Mircera 50 mcg every 2 weeks last dose 05/04/23  venofer 50 mg q wkly , Hectorol 3 mcg q. HD      Problem/Plan   Acute on chronic hypoxic & hypercarbic respiratory failure = s/p Iv Abx   Steroids now on oral, Duo nebs   Tx per Admit RX   Combined systolic & diastolic CHF = improving volume status with UF on HD. ESRD -  Hd  TTS , HD on schedule today Hypertension/volume  - BP  ok , vol as abv .standing post weight if able - try to dial down EDW - looking better today.  Anemia  - hgb  9s  ( just had esa 7/27)  Metabolic bone disease -  Ca corec  ok, continue Hectorol, Phos binders  with diet   DM  Type 2 = per admit team  HO  lung cancer-s/p curative radiation  Estill Bakes MD [facility] Pager [phone]   Labs: Basic Metabolic Panel: Recent Labs  Lab 05/08/23 0058 05/09/23 0527 05/11/23 0614  NA 132* 130* 130*  130*  K 4.0 4.3 4.8  4.8  CL 90* 88* 91*  92*  CO2 28 25 26  28   GLUCOSE 174* 159* 128*  130*  BUN 40* 68* 76*  76*  CREATININE 5.11* 7.06* 6.71*  6.81*  CALCIUM 8.9 8.8* 9.3  9.4  PHOS 5.2*  --  4.5   Liver Function Tests: Recent Labs  Lab 05/06/23 2111 05/08/23 0058  05/11/23 0614  AST 23  --   --   ALT 14  --   --   ALKPHOS 126  --   --   BILITOT 0.6  --   --   PROT 6.8  --   --   ALBUMIN 3.3* 3.2* 3.1*   No results for input(s): "LIPASE", "AMYLASE" in the last 168 hours. No results for input(s): "AMMONIA" in the last 168 hours. CBC: Recent Labs  Lab 05/06/23 2111 05/07/23 0050 05/07/23 0619 05/08/23 0057 05/09/23 0527 05/11/23 0614  WBC 10.3  --  13.4* 12.0* 7.9 8.3  NEUTROABS 8.6*  --   --   --   --   --   HGB 10.2*   < > 10.5* 10.0* 9.9* 9.6*  HCT 35.7*   < > 36.8 34.4* 33.7* 32.4*  MCV 105.9*  --  105.7* 102.7* 103.1* 102.5*  PLT 208  --  214 230 269 248   < > = values in this interval not displayed.   Cardiac Enzymes: No results for input(s): "CKTOTAL", "CKMB", "CKMBINDEX", "TROPONINI" in the last 168 hours. CBG: Recent Labs  Lab 05/09/23 2102 05/10/23 0749 05/10/23 1205 05/10/23 1630 05/10/23 2154  GLUCAP 272* 126* 293* 339* 211*    Studies/Results: No results found. Medications:  anticoagulant sodium citrate      allopurinol  100 mg Oral Daily   arformoterol  15 mcg Nebulization BID   azithromycin  500 mg Oral Daily   budesonide (PULMICORT) nebulizer solution  0.25 mg Nebulization BID   carvedilol  6.25 mg Oral BID WC   Chlorhexidine Gluconate Cloth  6 each Topical Q0600   clopidogrel  75 mg Oral Daily   doxercalciferol  3 mcg Intravenous Q T,Th,Sa-HD   heparin  2,000 Units Dialysis Once in dialysis   heparin  5,000 Units Subcutaneous Q8H   insulin aspart  0-5 Units Subcutaneous QHS   insulin aspart  0-9 Units Subcutaneous TID WC   levothyroxine  175 mcg Oral Daily   pantoprazole  40 mg Oral BID AC   predniSONE  30 mg Oral Q breakfast   revefenacin  175 mcg Nebulization Daily   sevelamer carbonate  1,600 mg Oral TID WC   sodium chloride flush  3 mL Intravenous Q12H

## 2023-05-12 DIAGNOSIS — G894 Chronic pain syndrome: Secondary | ICD-10-CM | POA: Diagnosis not present

## 2023-05-12 DIAGNOSIS — I5043 Acute on chronic combined systolic (congestive) and diastolic (congestive) heart failure: Secondary | ICD-10-CM | POA: Diagnosis not present

## 2023-05-12 DIAGNOSIS — J9622 Acute and chronic respiratory failure with hypercapnia: Secondary | ICD-10-CM | POA: Diagnosis not present

## 2023-05-12 DIAGNOSIS — J441 Chronic obstructive pulmonary disease with (acute) exacerbation: Secondary | ICD-10-CM | POA: Diagnosis not present

## 2023-05-12 LAB — GLUCOSE, CAPILLARY
Glucose-Capillary: 118 mg/dL — ABNORMAL HIGH (ref 70–99)
Glucose-Capillary: 221 mg/dL — ABNORMAL HIGH (ref 70–99)
Glucose-Capillary: 189 mg/dL — ABNORMAL HIGH (ref 70–99)
Glucose-Capillary: 262 mg/dL — ABNORMAL HIGH (ref 70–99)

## 2023-05-12 MED ORDER — POLYETHYLENE GLYCOL 3350 17 G PO PACK
17.0000 g | PACK | Freq: Every day | ORAL | Status: DC
  Administered 2023-05-12 – 2023-05-14 (×3): 17 g via ORAL
  Filled 2023-05-12 (×3): qty 1

## 2023-05-12 MED ORDER — INSULIN ASPART 100 UNIT/ML IJ SOLN
0.0000 [IU] | Freq: Three times a day (TID) | INTRAMUSCULAR | Status: DC
  Administered 2023-05-12: 3 [IU] via SUBCUTANEOUS
  Administered 2023-05-12: 5 [IU] via SUBCUTANEOUS
  Administered 2023-05-13 (×3): 3 [IU] via SUBCUTANEOUS
  Administered 2023-05-14 (×2): 2 [IU] via SUBCUTANEOUS

## 2023-05-12 MED ORDER — POLYETHYLENE GLYCOL 3350 17 G PO PACK
17.0000 g | PACK | Freq: Every day | ORAL | Status: DC | PRN
  Filled 2023-05-12: qty 1

## 2023-05-12 MED ORDER — BISACODYL 10 MG RE SUPP
10.0000 mg | Freq: Every day | RECTAL | Status: DC | PRN
  Administered 2023-05-12: 10 mg via RECTAL
  Filled 2023-05-12: qty 1

## 2023-05-12 NOTE — Progress Notes (Signed)
Pt Refused BiPAP.

## 2023-05-12 NOTE — Progress Notes (Signed)
PROGRESS NOTE        PATIENT DETAILS Name: [name] [name] Age: 72 y.o. Sex: female Date of Birth: 1950/11/07 Admit Date: 05/06/2023 Admitting Physician Briscoe Deutscher, MD ZOX:WRUE, [name], FNP  Brief Summary: Patient is a 72 y.o.  female with history of COPD, non-small cell cancer-s/p curative radiation (completed April 06, 2022), ESRD on HD, HTN, DM-2, chronic combined systolic/diastolic heart failure, OSA with CPAP intolerance-presented with worsening shortness of breath-found to have COPD exacerbation with worsening hypercarbia-subsequently admitted to the hospitalist service.  See below for further details.  Significant events: 7/29>> admit to [facility]  Significant studies: 7/29>> CT angio chest: No PE.  Vascular congestion but no overt edema.  Significant microbiology data: 7/29>> COVID PCR: Negative  Procedures: None  Consults: Nephrology  Subjective: No major issues overnight-only complaint is some constipation.  Objective: Vitals: Blood pressure 117/64, pulse 87, temperature 98 F (36.7 C), temperature source Oral, resp. rate 18, height 5\' 3"  (1.6 m), weight 99.7 kg, SpO2 95%.   Exam: Gen Exam:Alert awake-not in any distress HEENT:atraumatic, normocephalic Chest: B/L clear to auscultation anteriorly CVS:S1S2 regular Abdomen:soft non tender, non distended Extremities:no edema Neurology: Non focal-has chronic lower extremity weakness-at baseline. Skin: no rash  Pertinent Labs/Radiology:    Latest Ref Rng & Units 05/11/2023    6:14 AM 05/09/2023    5:27 AM 05/08/2023   12:57 AM  CBC  WBC 4.0 - 10.5 K/uL 8.3  7.9  12.0   Hemoglobin 12.0 - 15.0 g/dL 9.6  9.9  45.4   Hematocrit 36.0 - 46.0 % 32.4  33.7  34.4   Platelets 150 - 400 K/uL 248  269  230     Lab Results  Component Value Date   NA 130 (L) 05/11/2023   NA 130 (L) 05/11/2023   K 4.8 05/11/2023   K 4.8 05/11/2023   CL 92 (L) 05/11/2023   CL 91 (L) 05/11/2023   CO2 28  05/11/2023   CO2 26 05/11/2023      Assessment/Plan: COPD exacerbation Acute on chronic hypoxic/hypercarbic respiratory failure (on home O2-2-3 L) Overall improved  Down to 2 L of oxygen which is at her baseline Continue tapering steroids Continue bronchodilators Minimize sedating medications as much as possible.  Acute on chronic combined diastolic/systolic heart failure in the setting of ESRD Improved after volume removal with hemodialysis.  ESRD on HD TTS Nephrology following  OSA Intolerant to CPAP On home oxygen  History of lung cancer-s/p curative radiation Resume outpatient follow-up with radiation/medical oncology  History of PAD Plavix  Hypothyroidism Synthroid  Uncontrolled DM-2-with steroid-induced hyperglycemia (A1c 7.1 on 7/6) CBGs stable-still on the higher side-prednisone now discontinued-change SSI to moderate scale.    Recent Labs    05/11/23 1644 05/11/23 2129 05/12/23 0804  GLUCAP 289* 285* 118*    Constipation MiraLAX changed to daily dosing Dulcolax suppository x 1 today  Debility/deconditioning Worsened by acute illness PT/OT eval-SNF recommended.  Insurance authorization in progress.  Obesity: Estimated body mass index is 38.94 kg/m as calculated from the following:   Height as of this encounter: 5\' 3"  (1.6 m).   Weight as of this encounter: 99.7 kg.   Code status:   Code Status: Full Code   DVT Prophylaxis: heparin injection 5,000 Units Start: 05/07/23 1400   Family Communication: [name]-[phone] -called 8/4-no response   Disposition Plan: Status  is: Inpatient Remains inpatient appropriate because: Severity of illness    Planned Discharge Destination:Skilled nursing facility awaiting insurance authorization   Diet: Diet Order             Diet - low sodium heart healthy           Diet general           Diet renal with fluid restriction Fluid restriction: 1200 mL Fluid; Room service appropriate? Yes; Fluid  consistency: Thin  Diet effective now                     Antimicrobial agents: Anti-infectives (From admission, onward)    Start     Dose/Rate Route Frequency Ordered Stop   05/08/23 0800  azithromycin (ZITHROMAX) tablet 500 mg       Placed in "Followed by" Linked Group   500 mg Oral Daily 05/07/23 0307 05/11/23 1325   05/07/23 0315  azithromycin (ZITHROMAX) 500 mg in sodium chloride 0.9 % 250 mL IVPB       Placed in "Followed by" Linked Group   500 mg 250 mL/hr over 60 Minutes Intravenous Every 24 hours 05/07/23 0307 05/07/23 0341        MEDICATIONS: Scheduled Meds:  allopurinol  100 mg Oral Daily   arformoterol  15 mcg Nebulization BID   budesonide (PULMICORT) nebulizer solution  0.25 mg Nebulization BID   carvedilol  6.25 mg Oral BID WC   Chlorhexidine Gluconate Cloth  6 each Topical Q0600   clopidogrel  75 mg Oral Daily   doxercalciferol  3 mcg Intravenous Q T,Th,Sa-HD   heparin  5,000 Units Subcutaneous Q8H   insulin aspart  0-15 Units Subcutaneous TID WC   insulin aspart  0-5 Units Subcutaneous QHS   levothyroxine  175 mcg Oral Daily   pantoprazole  40 mg Oral BID AC   polyethylene glycol  17 g Oral Daily   revefenacin  175 mcg Nebulization Daily   sevelamer carbonate  1,600 mg Oral TID WC   sodium chloride flush  3 mL Intravenous Q12H   Continuous Infusions:   PRN Meds:.acetaminophen **OR** acetaminophen, albuterol, bisacodyl, ondansetron (ZOFRAN) IV, polyethylene glycol   I have personally reviewed following labs and imaging studies  LABORATORY DATA: CBC: Recent Labs  Lab 05/06/23 2111 05/07/23 0050 05/07/23 0342 05/07/23 0619 05/08/23 0057 05/09/23 0527 05/11/23 0614  WBC 10.3  --   --  13.4* 12.0* 7.9 8.3  NEUTROABS 8.6*  --   --   --   --   --   --   HGB 10.2*   < > 11.9* 10.5* 10.0* 9.9* 9.6*  HCT 35.7*   < > 35.0* 36.8 34.4* 33.7* 32.4*  MCV 105.9*  --   --  105.7* 102.7* 103.1* 102.5*  PLT 208  --   --  214 230 269 248   < > =  values in this interval not displayed.    Basic Metabolic Panel: Recent Labs  Lab 05/07/23 0619 05/07/23 2109 05/08/23 0057 05/08/23 0058 05/09/23 0527 05/11/23 0614  NA 131* 132*  --  132* 130* 130*  130*  K 5.7* 3.8  --  4.0 4.3 4.8  4.8  CL 90* 90*  --  90* 88* 91*  92*  CO2 28 25  --  28 25 26  28   GLUCOSE 206* 177*  --  174* 159* 128*  130*  BUN 64* 37*  --  40* 68* 76*  76*  CREATININE 7.58*  4.99*  --  5.11* 7.06* 6.71*  6.81*  CALCIUM 9.0 8.9  --  8.9 8.8* 9.3  9.4  MG  --  1.7 2.1  --   --  2.0  PHOS  --   --   --  5.2*  --  4.5    GFR: Estimated Creatinine Clearance: 8.4 mL/min (A) (by C-G formula based on SCr of 6.81 mg/dL (H)).  Liver Function Tests: Recent Labs  Lab 05/06/23 2111 05/08/23 0058 05/11/23 0614  AST 23  --   --   ALT 14  --   --   ALKPHOS 126  --   --   BILITOT 0.6  --   --   PROT 6.8  --   --   ALBUMIN 3.3* 3.2* 3.1*   No results for input(s): "LIPASE", "AMYLASE" in the last 168 hours. No results for input(s): "AMMONIA" in the last 168 hours.  Coagulation Profile: No results for input(s): "INR", "PROTIME" in the last 168 hours.  Cardiac Enzymes: No results for input(s): "CKTOTAL", "CKMB", "CKMBINDEX", "TROPONINI" in the last 168 hours.  BNP (last 3 results) No results for input(s): "PROBNP" in the last 8760 hours.  Lipid Profile: No results for input(s): "CHOL", "HDL", "LDLCALC", "TRIG", "CHOLHDL", "LDLDIRECT" in the last 72 hours.  Thyroid Function Tests: No results for input(s): "TSH", "T4TOTAL", "FREET4", "T3FREE", "THYROIDAB" in the last 72 hours.  Anemia Panel: No results for input(s): "VITAMINB12", "FOLATE", "FERRITIN", "TIBC", "IRON", "RETICCTPCT" in the last 72 hours.  Urine analysis:    Component Value Date/Time   COLORURINE YELLOW 11/27/2021 0956   APPEARANCEUR HAZY (A) 11/27/2021 0956   LABSPEC 1.012 11/27/2021 0956   PHURINE 5.0 11/27/2021 0956   GLUCOSEU NEGATIVE 11/27/2021 0956   HGBUR NEGATIVE  11/27/2021 0956   BILIRUBINUR NEGATIVE 11/27/2021 0956   KETONESUR NEGATIVE 11/27/2021 0956   PROTEINUR 30 (A) 11/27/2021 0956   UROBILINOGEN 0.2 08/04/2014 1409   NITRITE NEGATIVE 11/27/2021 0956   LEUKOCYTESUR NEGATIVE 11/27/2021 0956    Sepsis Labs: Lactic Acid, Venous    Component Value Date/Time   LATICACIDVEN 0.6 05/07/2023 0258    MICROBIOLOGY: Recent Results (from the past 240 hour(s))  SARS Coronavirus 2 by RT PCR (hospital order, performed in [facility] hospital lab) *cepheid single result test* Anterior Nasal Swab     Status: None   Collection Time: 05/06/23  7:38 PM   Specimen: Anterior Nasal Swab  Result Value Ref Range Status   SARS Coronavirus 2 by RT PCR NEGATIVE NEGATIVE Final    Comment: Performed at [facility], [address], [place], [place] [address]    RADIOLOGY STUDIES/RESULTS: No results found.   LOS: 5 days   Jeoffrey Massed, MD  [facility]    To contact the attending provider between 7A-7P or the covering provider during after hours 7P-7A, please log into the web site [email or web address] and access using universal [place] password for that web site. If you do not have the password, please call the hospital operator.  05/12/2023, 11:07 AM

## 2023-05-12 NOTE — Plan of Care (Signed)
  Problem: Education: Goal: Knowledge of the prescribed therapeutic regimen will improve Outcome: Progressing   Problem: Activity: Goal: Will verbalize the importance of balancing activity with adequate rest periods Outcome: Progressing   Problem: Health Behavior/Discharge Planning: Goal: Ability to identify and utilize available resources and services will improve Outcome: Progressing

## 2023-05-12 NOTE — Progress Notes (Signed)
[facility] Progress Note   Subjective: Seen lying flat in bed. Says she is impacted and that she hurts so badly she can't sit up. Issue addressed per Dr. Glenna Fellows. Denies SOB. HD 05/11/2023 Net UF 2.5 liters.   Objective Vitals:   05/12/23 0000 05/12/23 0024 05/12/23 0400 05/12/23 0800  BP: (!) 131/50  (!) 106/50 117/64  Pulse: 94 87 79 87  Resp: 19 20 18 18   Temp: 97.7 F (36.5 C)  97.9 F (36.6 C) 98 F (36.7 C)  TempSrc: Oral  Oral Oral  SpO2: 96% 95% 91% 95%  Weight:      Height:       Physical Exam General: Chronically ill appearing female in NAD Heart: S1,S2 No M/R/G. SR on monitor.  Lungs: Bilateral breath sounds decreased in bases otherwise CTAB. Respirations shallow no WOB.  Abdomen: Obese, NABS.  Extremities: Woody appearance BLE with trace pedal edema Dialysis Access: L AVG + T/B   Additional Objective Labs: Basic Metabolic Panel: Recent Labs  Lab 05/08/23 0058 05/09/23 0527 05/11/23 0614  NA 132* 130* 130*  130*  K 4.0 4.3 4.8  4.8  CL 90* 88* 91*  92*  CO2 28 25 26  28   GLUCOSE 174* 159* 128*  130*  BUN 40* 68* 76*  76*  CREATININE 5.11* 7.06* 6.71*  6.81*  CALCIUM 8.9 8.8* 9.3  9.4  PHOS 5.2*  --  4.5   Liver Function Tests: Recent Labs  Lab 05/06/23 2111 05/08/23 0058 05/11/23 0614  AST 23  --   --   ALT 14  --   --   ALKPHOS 126  --   --   BILITOT 0.6  --   --   PROT 6.8  --   --   ALBUMIN 3.3* 3.2* 3.1*   No results for input(s): "LIPASE", "AMYLASE" in the last 168 hours. CBC: Recent Labs  Lab 05/06/23 2111 05/07/23 0050 05/07/23 0619 05/08/23 0057 05/09/23 0527 05/11/23 0614  WBC 10.3  --  13.4* 12.0* 7.9 8.3  NEUTROABS 8.6*  --   --   --   --   --   HGB 10.2*   < > 10.5* 10.0* 9.9* 9.6*  HCT 35.7*   < > 36.8 34.4* 33.7* 32.4*  MCV 105.9*  --  105.7* 102.7* 103.1* 102.5*  PLT 208  --  214 230 269 248   < > = values in this interval not displayed.   Blood Culture    Component Value Date/Time    SDES BLOOD RIGHT HAND 11/27/2021 0929   SPECREQUEST  11/27/2021 0929    BOTTLES DRAWN AEROBIC AND ANAEROBIC Blood Culture adequate volume   CULT  11/27/2021 0929    NO GROWTH 5 DAYS Performed at [facility], [address], [place], [place] [address]    REPTSTATUS 12/02/2021 FINAL 11/27/2021 6045    Cardiac Enzymes: No results for input(s): "CKTOTAL", "CKMB", "CKMBINDEX", "TROPONINI" in the last 168 hours. CBG: Recent Labs  Lab 05/10/23 2154 05/11/23 1314 05/11/23 1644 05/11/23 2129 05/12/23 0804  GLUCAP 211* 115* 289* 285* 118*   Iron Studies: No results for input(s): "IRON", "TIBC", "TRANSFERRIN", "FERRITIN" in the last 72 hours. @lablastinr3 @ Studies/Results: No results found. Medications:   allopurinol  100 mg Oral Daily   arformoterol  15 mcg Nebulization BID   budesonide (PULMICORT) nebulizer solution  0.25 mg Nebulization BID   carvedilol  6.25 mg Oral BID WC   Chlorhexidine Gluconate Cloth  6 each Topical  G2952   clopidogrel  75 mg Oral Daily   doxercalciferol  3 mcg Intravenous Q T,Th,Sa-HD   heparin  5,000 Units Subcutaneous Q8H   insulin aspart  0-15 Units Subcutaneous TID WC   insulin aspart  0-5 Units Subcutaneous QHS   levothyroxine  175 mcg Oral Daily   pantoprazole  40 mg Oral BID AC   revefenacin  175 mcg Nebulization Daily   sevelamer carbonate  1,600 mg Oral TID WC   sodium chloride flush  3 mL Intravenous Q12H   HD orders: [facility] T,Th,S 4 hrs 180NRe 400/600 100.8 kgs 2.0 K/ 2.0 Ca  - Heparin 2000 units IV three times per week - Mircera 50 mcg IV q 2 weeks Last dose 05/04/2023 Next dose due 05/18/2023 - Venofer 50 mg IV q week - Hectorol 3 mcg IV three times per week   Assessment/Plan: Acute on chronic hypoxic & hypercarbic respiratory failure. Management per primary  Combined systolic & diastolic HF- Lower volume and optimize volume status with dialysis. Lower EDW on discharge.  ESRD -HD T,Th, S-Next HD 05/14/2023 Hypertension/volume  -  BP controlled. Net UF 2.5 Liters with HD 05/11/2023. Continue lowering volume as tolerated.  Anemia  - hgb  9s  ( just had esa 7/27) Follow HGB  Metabolic bone disease -  Ca corec  ok, continue Hectorol, Phos binders  with diet   DM  Type 2-per primary HO  lung cancer-s/p curative radiation Albumin 3.1. Protein supplements.  Constipation-suppository ordered. Change miralax to daily.    H.  NP-C 05/12/2023, 10:43 AM  [employer] [phone]

## 2023-05-12 NOTE — TOC Progression Note (Signed)
Transition of Care [facility]) - Progression Note    Patient Details  Name: [name] [name] MRN: [id number] Date of Birth: [birth year]  Transition of Care [facility]) CM/SW Contact  Jimmy Picket, [place] Phone Number: 05/12/2023, 9:28 AM  Clinical Narrative:     CSW called [employer], Patients insurance Berkley Harvey is still pending for Med director review.   Expected Discharge Plan: Skilled Nursing Facility Barriers to Discharge: Continued Medical Work up, English as a second language teacher  Expected Discharge Plan and Services In-house Referral: Clinical Social Work   Post Acute Care Choice: Skilled Nursing Facility Living arrangements for the past 2 months: Apartment, Skilled Nursing Facility Expected Discharge Date: 05/10/23                                     Social Determinants of Health (SDOH) Interventions SDOH Screenings   Food Insecurity: No Food Insecurity (04/13/2023)  Housing: Medium Risk (04/13/2023)  Transportation Needs: No Transportation Needs (04/13/2023)  Utilities: Not At Risk (04/13/2023)  Tobacco Use: Medium Risk (05/07/2023)    Readmission Risk Interventions    05/09/2023    3:37 PM 05/08/2023   12:17 PM  Readmission Risk Prevention Plan  Transportation Screening Complete Complete  Medication Review [employer]) Complete Complete  PCP or Specialist appointment within 3-5 days of discharge Complete Complete  HRI or Home Care Consult Complete Complete  SW Recovery Care/Counseling Consult Complete Complete  Palliative Care Screening Not Applicable Not Applicable  Skilled Nursing Facility Complete Complete

## 2023-05-12 NOTE — Plan of Care (Signed)
  Problem: Education: Goal: Knowledge of disease or condition will improve Outcome: Progressing Goal: Knowledge of the prescribed therapeutic regimen will improve Outcome: Progressing   Problem: Activity: Goal: Ability to tolerate increased activity will improve Outcome: Progressing Goal: Will verbalize the importance of balancing activity with adequate rest periods Outcome: Progressing   Problem: Respiratory: Goal: Ability to maintain a clear airway will improve Outcome: Progressing Goal: Levels of oxygenation will improve Outcome: Progressing Goal: Ability to maintain adequate ventilation will improve Outcome: Progressing   Problem: Education: Goal: Ability to describe self-care measures that may prevent or decrease complications (Diabetes Survival Skills Education) will improve Outcome: Progressing Goal: Individualized Educational Video(s) Outcome: Progressing   Problem: Coping: Goal: Ability to adjust to condition or change in health will improve Outcome: Progressing   Problem: Fluid Volume: Goal: Ability to maintain a balanced intake and output will improve Outcome: Progressing   Problem: Health Behavior/Discharge Planning: Goal: Ability to identify and utilize available resources and services will improve Outcome: Progressing

## 2023-05-13 ENCOUNTER — Inpatient Hospital Stay: Payer: No Typology Code available for payment source | Admitting: Internal Medicine

## 2023-05-13 ENCOUNTER — Ambulatory Visit (HOSPITAL_COMMUNITY): Payer: No Typology Code available for payment source

## 2023-05-13 DIAGNOSIS — G894 Chronic pain syndrome: Secondary | ICD-10-CM | POA: Diagnosis not present

## 2023-05-13 DIAGNOSIS — J441 Chronic obstructive pulmonary disease with (acute) exacerbation: Secondary | ICD-10-CM | POA: Diagnosis not present

## 2023-05-13 DIAGNOSIS — J9622 Acute and chronic respiratory failure with hypercapnia: Secondary | ICD-10-CM | POA: Diagnosis not present

## 2023-05-13 DIAGNOSIS — I5043 Acute on chronic combined systolic (congestive) and diastolic (congestive) heart failure: Secondary | ICD-10-CM | POA: Diagnosis not present

## 2023-05-13 LAB — GLUCOSE, CAPILLARY
Glucose-Capillary: 188 mg/dL — ABNORMAL HIGH (ref 70–99)
Glucose-Capillary: 190 mg/dL — ABNORMAL HIGH (ref 70–99)
Glucose-Capillary: 179 mg/dL — ABNORMAL HIGH (ref 70–99)
Glucose-Capillary: 166 mg/dL — ABNORMAL HIGH (ref 70–99)

## 2023-05-13 MED ORDER — OXYCODONE HCL 5 MG PO TABS
5.0000 mg | ORAL_TABLET | Freq: Once | ORAL | Status: AC | PRN
  Administered 2023-05-13: 5 mg via ORAL
  Filled 2023-05-13: qty 1

## 2023-05-13 NOTE — Progress Notes (Signed)
Physical Therapy Treatment Patient Details Name: [name] [name] MRN: [id number] DOB: [birth year] Today's Date: 05/13/2023   History of Present Illness The pt is a 72 yo female presenting 7/29 from [facility] with SOB. Work up revealed CHF exacerbation and acute on chronic resp failure with hypoxia and hypercapnia in setting of COPD exacerbation. 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    PT Comments  Patient able to stand twice from elevated EOB with one person, mod assist. However, 3rd attempted twice and unable to stand with only one person assist. Recommend +2 moving forward. Will need maximove for chair to bed transfer (even with built up/elevated seat of chair, pt reports she was barely able to stand up with 2 person assist and stedy for chair to bed). No maximove pads available today, therefore returned to bed at end of session.     If plan is discharge home, recommend the following: A lot of help with walking and/or transfers;A lot of help with bathing/dressing/bathroom;Assistance with cooking/housework;Direct supervision/assist for medications management;Direct supervision/assist for financial management;Assist for transportation   Can travel by private vehicle     No  Equipment Recommendations  None recommended by PT    Recommendations for Other Services       Precautions / Restrictions Precautions Precautions: Fall Precaution Comments: on 3L O2 at baseline Restrictions Weight Bearing Restrictions: No     Mobility  Bed Mobility Overal bed mobility: Needs Assistance Bed Mobility: Supine to Sit, Sit to Sidelying, Rolling Rolling: Supervision   Supine to sit: Mod assist, HOB elevated   Sit to  sidelying: Mod assist General bed mobility comments: pt prefers to start from supine wiht HOB elevated; assist to move legs toward EOB, to scoot hips to EOB, to raise torso; vc for going to sidelying instead of straight to supine; assist to raise legs onto bed and then rolled to her back; +2 total assist for scoot to [facility]    Transfers Overall transfer level: Needs assistance Equipment used: Ambulation equipment used Transfers: Sit to/from Stand Sit to Stand: Mod assist, From elevated surface           General transfer comment: From elevated EOB (as far as the stedy would allow); able to stand x 2 reps with several minutes of rest between; tried another 2 times with pt clearing her buttocks off bed but not able to come to full upright standing Transfer via Lift Equipment: Stedy  Ambulation/Gait                   Stairs             Wheelchair Mobility     Tilt Bed    Modified Rankin (Stroke Patients Only)       Balance Overall balance assessment: Needs assistance Sitting-balance support: No upper extremity supported, Feet supported Sitting balance-Leahy Scale: Good     Standing balance support: Bilateral upper extremity supported, During functional activity, Reliant on assistive device for balance Standing balance-Leahy Scale: Poor Standing balance comment: dependent on BUE support                            Cognition Arousal/Alertness:  Awake/alert Behavior During Therapy: WFL for tasks assessed/performed Overall Cognitive Status: Within Functional Limits for tasks assessed                                 General Comments: AAOx4, demonstrates ability to follow 2-step commands consistently, and pleasant throughout session        Exercises      General Comments General comments (skin integrity, edema, etc.): VSS on 3L      Pertinent Vitals/Pain Pain Assessment Pain Assessment: 0-10 Pain Score: 10-Worst pain ever Pain  Location: R knee with mobility, Pain Descriptors / Indicators: Discomfort, Grimacing, Guarding Pain Intervention(s): Limited activity within patient's tolerance, Monitored during session, Repositioned    Home Living                          Prior Function            PT Goals (current goals can now be found in the care plan section) Acute Rehab PT Goals Patient Stated Goal: return to rehab and ultimately go home Time For Goal Achievement: 05/24/23 Potential to Achieve Goals: Good Progress towards PT goals: Progressing toward goals    Frequency    Min 1X/week      PT Plan Current plan remains appropriate    Co-evaluation              AM-PAC PT "6 Clicks" Mobility   Outcome Measure  Help needed turning from your back to your side while in a flat bed without using bedrails?: A Little Help needed moving from lying on your back to sitting on the side of a flat bed without using bedrails?: A Lot Help needed moving to and from a bed to a chair (including a wheelchair)?: Total Help needed standing up from a chair using your arms (e.g., wheelchair or bedside chair)?: Total Help needed to walk in hospital room?: Total Help needed climbing 3-5 steps with a railing? : Total 6 Click Score: 9    End of Session Equipment Utilized During Treatment: Gait belt;Oxygen Activity Tolerance: Patient limited by pain;Patient limited by fatigue Patient left: with call bell/phone within reach;in bed;with bed alarm set Nurse Communication: Mobility status;Need for lift equipment (use of stedy; would need maximove to get out of chair) PT Visit Diagnosis: Unsteadiness on feet (R26.81);Other abnormalities of gait and mobility (R26.89);Muscle weakness (generalized) (M62.81);Difficulty in walking, not elsewhere classified (R26.2);Pain Pain - Right/Left: Right Pain - part of body: Knee     Time: 1610-9604 PT Time Calculation (min) (ACUTE ONLY): 51 min  Charges:    $Therapeutic  Activity: 23-37 mins PT General Charges $$ ACUTE PT VISIT: 1 Visit                      Jerolyn Center, PT Acute Rehabilitation Services  Office [phone]    [name] [name] 05/13/2023, 1:16 PM

## 2023-05-13 NOTE — TOC Progression Note (Signed)
Transition of Care [facility]) - Progression Note    Patient Details  Name: [name] [name] MRN: [id number] Date of Birth: 27-Jul-1951  Transition of [facility]) CM/SW Contact  Gordy Clement, RN Phone Number: 05/13/2023, 3:49 PM  Clinical Narrative:     Patient will DC to home with Son to assist. Per CSW, Son is aware that patient cannot walk. They have all equipment needed in the home with the exception of a hoyer lift. [employer] will be delivering a hoyer lift to the home . They have been asked to reach out to Son to arrange. [facility] has been contacted for PT and OT in the home after dc. They have provided services in the past. Patient also has had PCS services from [facility] [phone] 7 days a week for a total of 18 hours. RNCM has received paperwork to have signed to assist in the process of patient receiving more hours from [facility] services.   TOC will continue to follow patient for any additional discharge needs     Expected Discharge Plan: Skilled Nursing Facility Barriers to Discharge: Continued Medical Work up, English as a second language teacher  Expected Discharge Plan and Services In-house Referral: Clinical Social Work   Post Acute Care Choice: Skilled Nursing Facility Living arrangements for the past 2 months: Apartment, Skilled Nursing Facility Expected Discharge Date: 05/10/23                                     Social Determinants of Health (SDOH) Interventions SDOH Screenings   Food Insecurity: No Food Insecurity (04/13/2023)  Housing: Medium Risk (04/13/2023)  Transportation Needs: No Transportation Needs (04/13/2023)  Utilities: Not At Risk (04/13/2023)  Tobacco Use: Medium Risk (05/07/2023)    Readmission Risk Interventions    05/09/2023    3:37 PM 05/08/2023   12:17 PM  Readmission Risk Prevention Plan  Transportation Screening Complete Complete  Medication Review Oceanographer) Complete Complete  PCP or Specialist appointment within 3-5 days of discharge  Complete Complete  HRI or Home Care Consult Complete Complete  SW Recovery Care/Counseling Consult Complete Complete  Palliative Care Screening Not Applicable Not Applicable  Skilled Nursing Facility Complete Complete

## 2023-05-13 NOTE — Progress Notes (Signed)
PROGRESS NOTE        PATIENT DETAILS Name: [name] [name] Age: 72 y.o. Sex: female Date of Birth: 09-28-1951 Admit Date: 05/06/2023 Admitting Physician Briscoe Deutscher, MD ZOX:WRUE, [name], FNP  Brief Summary: Patient is a 72 y.o.  female with history of COPD, non-small cell cancer-s/p curative radiation (completed April 06, 2022), ESRD on HD, HTN, DM-2, chronic combined systolic/diastolic heart failure, OSA with CPAP intolerance-presented with worsening shortness of breath-found to have COPD exacerbation with worsening hypercarbia-subsequently admitted to the hospitalist service.  See below for further details.  Significant events: 7/29>> admit to [facility]  Significant studies: 7/29>> CT angio chest: No PE.  Vascular congestion but no overt edema.  Significant microbiology data: 7/29>> COVID PCR: Negative  Procedures: None  Consults: Nephrology  Subjective: Had BM yesterday-inquiring about discharge plans-explained that we are awaiting insurance authorization.  Objective: Vitals: Blood pressure 138/62, pulse 86, temperature 97.9 F (36.6 C), temperature source Oral, resp. rate (!) 21, height 5\' 3"  (1.6 m), weight 99.7 kg, SpO2 96%.   Exam: Gen Exam:Alert awake-not in any distress HEENT:atraumatic, normocephalic Chest: B/L clear to auscultation anteriorly CVS:S1S2 regular Abdomen:soft non tender, non distended Extremities:no edema Neurology: Non focal-has chronic lower extremity weakness-at baseline. 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  Acute on chronic combined diastolic/systolic heart failure in the setting of ESRD Improved after volume removal with hemodialysis.  ESRD on HD TTS Nephrology following for HD DKA  OSA Intolerant to CPAP On home oxygen  History of lung cancer-s/p curative radiation Resume outpatient follow-up with radiation/medical oncology  History of PAD Plavix  Hypothyroidism Synthroid  Uncontrolled DM-2-with steroid-induced hyperglycemia (A1c 7.1 on 7/6) CBGs relatively stable on moderate SSI.  No longer steroids.  Recent Labs    05/12/23 1652 05/12/23 2136 05/13/23 0813  GLUCAP 189* 262* 188*    Constipation Had BM on 8/4 Daily MiraLAX As needed Dulcolax suppository.  Debility/deconditioning Worsened by acute illness has significant debility at baseline-and OA involving knees PT/OT eval-SNF recommended.  Insurance authorization in progress.  Obesity: Estimated body mass index is 38.94 kg/m as calculated from the following:   Height as of this encounter: 5\' 3"  (1.6 m).   Weight as of this encounter: 99.7 kg.   Code status:   Code Status: Full Code   DVT Prophylaxis: heparin injection 5,000 Units Start: 05/07/23 1400  Family Communication: [name]-[phone] -called 8/4-no response   Disposition Plan: Status is: Inpatient Remains inpatient appropriate because: Severity of illness    Planned Discharge Destination:Skilled nursing facility awaiting insurance authorization-medically stable for transfer   Diet: Diet Order             Diet renal with fluid restriction Fluid restriction: 1200 mL Fluid;  Room service appropriate? Yes with Assist; Fluid consistency: Thin  Diet effective now           Diet - low sodium heart healthy           Diet general                     Antimicrobial agents: Anti-infectives (From admission, onward)    Start     Dose/Rate Route Frequency Ordered Stop   05/08/23 0800  azithromycin (ZITHROMAX) tablet 500 mg       Placed in "Followed by" Linked Group   500 mg Oral Daily 05/07/23 0307 05/11/23 1325   05/07/23 0315  azithromycin (ZITHROMAX) 500 mg in sodium chloride 0.9 % 250 mL IVPB       Placed in "Followed by" Linked Group   500 mg 250 mL/hr over 60 Minutes Intravenous Every 24 hours 05/07/23 0307 05/07/23 0341        MEDICATIONS: Scheduled Meds:  allopurinol  100 mg Oral Daily   arformoterol  15 mcg Nebulization BID   budesonide (PULMICORT) nebulizer solution  0.25 mg Nebulization BID   carvedilol  6.25 mg Oral BID WC   Chlorhexidine Gluconate Cloth  6 each Topical Q0600   clopidogrel  75 mg Oral Daily   doxercalciferol  3 mcg Intravenous Q T,Th,Sa-HD   heparin  5,000 Units Subcutaneous Q8H   insulin aspart  0-15 Units Subcutaneous TID WC   insulin aspart  0-5 Units Subcutaneous QHS   levothyroxine  175 mcg Oral Daily   pantoprazole  40 mg Oral BID AC   polyethylene glycol  17 g Oral Daily   revefenacin  175 mcg Nebulization Daily   sevelamer carbonate  1,600 mg Oral TID WC   sodium chloride flush  3 mL Intravenous Q12H   Continuous Infusions:   PRN Meds:.acetaminophen **OR** acetaminophen, albuterol, bisacodyl, ondansetron (ZOFRAN) IV, polyethylene glycol   I have personally reviewed following labs and imaging studies  LABORATORY DATA: CBC: Recent Labs  Lab 05/06/23 2111 05/07/23 0050 05/07/23 0342 05/07/23 0619 05/08/23 0057 05/09/23 0527 05/11/23 0614  WBC 10.3  --   --  13.4* 12.0* 7.9 8.3  NEUTROABS 8.6*  --   --   --   --   --   --   HGB 10.2*   < > 11.9* 10.5* 10.0* 9.9* 9.6*  HCT 35.7*   < > 35.0* 36.8  34.4* 33.7* 32.4*  MCV 105.9*  --   --  105.7* 102.7* 103.1* 102.5*  PLT 208  --   --  214 230 269 248   < > = values in this interval not displayed.    Basic Metabolic Panel: Recent Labs  Lab 05/07/23 0619 05/07/23 2109 05/08/23 0057 05/08/23 0058 05/09/23 0527 05/11/23 0614  NA 131* 132*  --  132* 130* 130*  130*  K 5.7* 3.8  --  4.0 4.3 4.8  4.8  CL 90* 90*  --  90* 88* 91*  92*  CO2 28 25  --  28 25 26  28   GLUCOSE 206* 177*  --  174* 159* 128*  130*  BUN 64* 37*  --  40* 68* 76*  76*  CREATININE 7.58* 4.99*  --  5.11* 7.06* 6.71*  6.81*  CALCIUM 9.0 8.9  --  8.9 8.8* 9.3  9.4  MG  --  1.7 2.1  --   --  2.0  PHOS  --   --   --  5.2*  --  4.5    GFR: Estimated Creatinine Clearance: 8.4 mL/min (A) (by C-G formula based on SCr of 6.81 mg/dL (H)).  Liver Function Tests: Recent Labs  Lab 05/06/23 2111 05/08/23 0058 05/11/23 0614  AST 23  --   --   ALT 14  --   --   ALKPHOS 126  --   --   BILITOT 0.6  --   --   PROT 6.8  --   --   ALBUMIN 3.3* 3.2* 3.1*   No results for input(s): "LIPASE", "AMYLASE" in the last 168 hours. No results for input(s): "AMMONIA" in the last 168 hours.  Coagulation Profile: No results for input(s): "INR", "PROTIME" in the last 168 hours.  Cardiac Enzymes: No results for input(s): "CKTOTAL", "CKMB", "CKMBINDEX", "TROPONINI" in the last 168 hours.  BNP (last 3 results) No results for input(s): "PROBNP" in the last 8760 hours.  Lipid Profile: No results for input(s): "CHOL", "HDL", "LDLCALC", "TRIG", "CHOLHDL", "LDLDIRECT" in the last 72 hours.  Thyroid Function Tests: No results for input(s): "TSH", "T4TOTAL", "FREET4", "T3FREE", "THYROIDAB" in the last 72 hours.  Anemia Panel: No results for input(s): "VITAMINB12", "FOLATE", "FERRITIN", "TIBC", "IRON", "RETICCTPCT" in the last 72 hours.  Urine analysis:    Component Value Date/Time   COLORURINE YELLOW 11/27/2021 0956   APPEARANCEUR HAZY (A) 11/27/2021 0956   LABSPEC  1.012 11/27/2021 0956   PHURINE 5.0 11/27/2021 0956   GLUCOSEU NEGATIVE 11/27/2021 0956   HGBUR NEGATIVE 11/27/2021 0956   BILIRUBINUR NEGATIVE 11/27/2021 0956   KETONESUR NEGATIVE 11/27/2021 0956   PROTEINUR 30 (A) 11/27/2021 0956   UROBILINOGEN 0.2 08/04/2014 1409   NITRITE NEGATIVE 11/27/2021 0956   LEUKOCYTESUR NEGATIVE 11/27/2021 0956    Sepsis Labs: Lactic Acid, Venous    Component Value Date/Time   LATICACIDVEN 0.6 05/07/2023 0258    MICROBIOLOGY: Recent Results (from the past 240 hour(s))  SARS Coronavirus 2 by RT PCR (hospital order, performed in [facility] hospital lab) *cepheid single result test* Anterior Nasal Swab     Status: None   Collection Time: 05/06/23  7:38 PM   Specimen: Anterior Nasal Swab  Result Value Ref Range Status   SARS Coronavirus 2 by RT PCR NEGATIVE NEGATIVE Final    Comment: Performed at [facility], [address] [place], [place] [address]    RADIOLOGY STUDIES/RESULTS: No results found.   LOS: 6 days   Jeoffrey Massed, MD  [facility]    To contact the attending provider between 7A-7P or the covering provider during after hours 7P-7A, please log into the web site [email or web address] and access using universal [place] password for that web site. If you do not have the password, please call the hospital operator.  05/13/2023, 11:48 AM

## 2023-05-13 NOTE — TOC Progression Note (Addendum)
Transition of Care [facility]) - Progression Note    Patient Details  Name: [name] [name] MRN: [id number] Date of Birth: 26-Sep-1951  Transition of Care [facility]) CM/SW Contact  [name] Latin, [name] [name] Phone Number: 05/13/2023, 9:02 AM  Clinical Narrative:    9am-Insurance approval still pending for [facility].   1:41pm-Peer to Peer offered and completed. Was denied per MD. CSW awaiting response from [facility] on if patient can return on her Medicaid.   1:48pm-Per [facility], patient's Medicaid is not the kind that pays for long term care and that son has stated he will take patient home after rehab.  CSW spoke with [name] [name] and he confirmed they do not want long term care for patient (do not want to have to turn over her check monthly) and that he felt comfortable taking her home. CSW offered to find a different SNF and ask for LOG approval pending Medicaid but he declined and stated patient will have support at home. He confirmed she has home oxygen, hospital bed, wheelchair, scooter, bedside commode, shower chair; patient may need a hoyer lift. He stated [facility] transports her to dialysis. He requested CSW speak with patient to confirm plan.   CSW met with patient ([name] [name] was on her speaker phone as well). Patient confirmed that she does not want to go to a SNF long term as she does not want to give "all her money to them and insurance alone is $70". She requested home home health discharge. She requested her aide be set up again through [employer] [phone]). CSW will request assistance from [facility] for orders.   Expected Discharge Plan: Skilled Nursing Facility Barriers to Discharge: Continued Medical Work up, English as a second language teacher  Expected Discharge Plan and Services In-house Referral: Clinical Social Work   Post Acute Care Choice: Skilled Nursing Facility Living arrangements for the past 2 months: Apartment, Skilled Nursing Facility Expected Discharge Date: 05/10/23                                      Social Determinants of Health (SDOH) Interventions SDOH Screenings   Food Insecurity: No Food Insecurity (04/13/2023)  Housing: Medium Risk (04/13/2023)  Transportation Needs: No Transportation Needs (04/13/2023)  Utilities: Not At Risk (04/13/2023)  Tobacco Use: Medium Risk (05/07/2023)    Readmission Risk Interventions    05/09/2023    3:37 PM 05/08/2023   12:17 PM  Readmission Risk Prevention Plan  Transportation Screening Complete Complete  Medication Review Oceanographer) Complete Complete  PCP or Specialist appointment within 3-5 days of discharge Complete Complete  HRI or Home Care Consult Complete Complete  SW Recovery Care/Counseling Consult Complete Complete  Palliative Care Screening Not Applicable Not Applicable  Skilled Nursing Facility Complete Complete

## 2023-05-13 NOTE — Progress Notes (Signed)
[facility] Progress Note   Subjective:    Seen and examined patient at bedside. Informed by SW that her current insurance will not continue payment at outpatient rehab facility. Patient expresses concern for this. Currently working on getting home health services for her. Next HD 8/6.  Objective Vitals:   05/13/23 0759 05/13/23 0803 05/13/23 0810 05/13/23 1220  BP:   138/62 (!) 142/89  Pulse: 93  86 96  Resp: 17  (!) 21 19  Temp:   97.9 F (36.6 C) 99.4 F (37.4 C)  TempSrc:   Oral Oral  SpO2: 97% 97% 96% 94%  Weight:      Height:       Physical Exam General: Chronically ill appearing female in NAD Heart: S1,S2 No M/R/G. SR on monitor.  Lungs: Bilateral breath sounds decreased in bases otherwise CTAB. Respirations shallow no WOB.  Abdomen: Obese, NABS.  Extremities: Woody appearance BLE with trace pedal edema Dialysis Access: L AVG + T/B  Filed Weights   05/11/23 0500 05/11/23 0750 05/11/23 1232  Weight: 101.7 kg 101.1 kg 99.7 kg    Intake/Output Summary (Last 24 hours) at 05/13/2023 1422 Last data filed at 05/12/2023 2225 Gross per 24 hour  Intake 243 ml  Output --  Net 243 ml    Additional Objective Labs: Basic Metabolic Panel: Recent Labs  Lab 05/08/23 0058 05/09/23 0527 05/11/23 0614  NA 132* 130* 130*  130*  K 4.0 4.3 4.8  4.8  CL 90* 88* 91*  92*  CO2 28 25 26  28   GLUCOSE 174* 159* 128*  130*  BUN 40* 68* 76*  76*  CREATININE 5.11* 7.06* 6.71*  6.81*  CALCIUM 8.9 8.8* 9.3  9.4  PHOS 5.2*  --  4.5   Liver Function Tests: Recent Labs  Lab 05/06/23 2111 05/08/23 0058 05/11/23 0614  AST 23  --   --   ALT 14  --   --   ALKPHOS 126  --   --   BILITOT 0.6  --   --   PROT 6.8  --   --   ALBUMIN 3.3* 3.2* 3.1*   No results for input(s): "LIPASE", "AMYLASE" in the last 168 hours. CBC: Recent Labs  Lab 05/06/23 2111 05/07/23 0050 05/07/23 0619 05/08/23 0057 05/09/23 0527 05/11/23 0614  WBC 10.3  --  13.4* 12.0* 7.9  8.3  NEUTROABS 8.6*  --   --   --   --   --   HGB 10.2*   < > 10.5* 10.0* 9.9* 9.6*  HCT 35.7*   < > 36.8 34.4* 33.7* 32.4*  MCV 105.9*  --  105.7* 102.7* 103.1* 102.5*  PLT 208  --  214 230 269 248   < > = values in this interval not displayed.   Blood Culture    Component Value Date/Time   SDES BLOOD RIGHT HAND 11/27/2021 0929   SPECREQUEST  11/27/2021 0929    BOTTLES DRAWN AEROBIC AND ANAEROBIC Blood Culture adequate volume   CULT  11/27/2021 0929    NO GROWTH 5 DAYS Performed at [facility], [address] [place], [place] [address]    REPTSTATUS 12/02/2021 FINAL 11/27/2021 6045    Cardiac Enzymes: No results for input(s): "CKTOTAL", "CKMB", "CKMBINDEX", "TROPONINI" in the last 168 hours. CBG: Recent Labs  Lab 05/12/23 1235 05/12/23 1652 05/12/23 2136 05/13/23 0813 05/13/23 1218  GLUCAP 221* 189* 262* 188* 190*   Iron Studies: No results for input(s): "IRON", "TIBC", "TRANSFERRIN", "  FERRITIN" in the last 72 hours. Lab Results  Component Value Date   INR 1.0 07/01/2021   INR 1.0 01/05/2021   INR 1.2 01/06/2020   Studies/Results: No results found.  Medications:   allopurinol  100 mg Oral Daily   arformoterol  15 mcg Nebulization BID   budesonide (PULMICORT) nebulizer solution  0.25 mg Nebulization BID   carvedilol  6.25 mg Oral BID WC   Chlorhexidine Gluconate Cloth  6 each Topical Q0600   clopidogrel  75 mg Oral Daily   doxercalciferol  3 mcg Intravenous Q T,Th,Sa-HD   heparin  5,000 Units Subcutaneous Q8H   insulin aspart  0-15 Units Subcutaneous TID WC   insulin aspart  0-5 Units Subcutaneous QHS   levothyroxine  175 mcg Oral Daily   pantoprazole  40 mg Oral BID AC   polyethylene glycol  17 g Oral Daily   revefenacin  175 mcg Nebulization Daily   sevelamer carbonate  1,600 mg Oral TID WC   sodium chloride flush  3 mL Intravenous Q12H    Dialysis Orders: [facility] T,Th,S 4 hrs 180NRe 400/600 100.8 kgs 2.0 K/ 2.0 Ca  - Heparin 2000 units IV  three times per week - Mircera 50 mcg IV q 2 weeks Last dose 05/04/2023 Next dose due 05/18/2023 - Venofer 50 mg IV q week - Hectorol 3 mcg IV three times per week  Assessment/Plan: Acute on chronic hypoxic & hypercarbic respiratory failure. Management per primary  Combined systolic & diastolic HF- Lower volume and optimize volume status with dialysis. Lower EDW on discharge.  ESRD -HD T,Th, S-Next HD 05/14/2023 Hypertension/volume  - BP controlled. Net UF 2.5 Liters with HD 05/11/2023. Continue lowering volume as tolerated.  Anemia  - hgb  9s  ( just had esa 7/27) Follow HGB  Metabolic bone disease -  Ca corec  ok, continue Hectorol, Phos binders  with diet   DM  Type 2-per primary HO  lung cancer-s/p curative radiation Albumin 3.1. Protein supplements.  Constipation-suppository ordered. Change miralax to daily.   Salome Holmes, NP [facility] 05/13/2023,2:22 PM  LOS: 6 days

## 2023-05-14 ENCOUNTER — Other Ambulatory Visit (HOSPITAL_COMMUNITY): Payer: Self-pay

## 2023-05-14 ENCOUNTER — Encounter (HOSPITAL_COMMUNITY): Payer: Self-pay

## 2023-05-14 DIAGNOSIS — E1122 Type 2 diabetes mellitus with diabetic chronic kidney disease: Secondary | ICD-10-CM | POA: Diagnosis not present

## 2023-05-14 DIAGNOSIS — J9622 Acute and chronic respiratory failure with hypercapnia: Secondary | ICD-10-CM | POA: Diagnosis not present

## 2023-05-14 DIAGNOSIS — I5043 Acute on chronic combined systolic (congestive) and diastolic (congestive) heart failure: Secondary | ICD-10-CM | POA: Diagnosis not present

## 2023-05-14 DIAGNOSIS — J441 Chronic obstructive pulmonary disease with (acute) exacerbation: Secondary | ICD-10-CM | POA: Diagnosis not present

## 2023-05-14 LAB — GLUCOSE, CAPILLARY
Glucose-Capillary: 140 mg/dL — ABNORMAL HIGH (ref 70–99)
Glucose-Capillary: 124 mg/dL — ABNORMAL HIGH (ref 70–99)

## 2023-05-14 MED ORDER — ALTEPLASE 2 MG IJ SOLR: 2.0000 mg | Freq: Once | INTRAMUSCULAR | Status: DC | PRN

## 2023-05-14 MED ORDER — LIDOCAINE 4 % EX PTCH
2.0000 | MEDICATED_PATCH | CUTANEOUS | 1 refills | Status: DC
  Filled 2023-05-14: qty 6, 3d supply, fill #0

## 2023-05-14 MED ORDER — TAB-A-VITE/IRON PO TABS
1.0000 | ORAL_TABLET | Freq: Every morning | ORAL | 1 refills | Status: DC
  Filled 2023-05-14: qty 30, 30d supply, fill #0

## 2023-05-14 MED ORDER — SEVELAMER CARBONATE 800 MG PO TABS
1600.0000 mg | ORAL_TABLET | Freq: Three times a day (TID) | ORAL | 2 refills | Status: DC
  Filled 2023-05-14: qty 180, 30d supply, fill #0

## 2023-05-14 MED ORDER — HEPARIN SODIUM (PORCINE) 1000 UNIT/ML DIALYSIS: 1000.0000 [IU] | INTRAMUSCULAR | Status: DC | PRN

## 2023-05-14 MED ORDER — LEVOTHYROXINE SODIUM 175 MCG PO TABS
175.0000 ug | ORAL_TABLET | Freq: Every day | ORAL | 3 refills | Status: DC
  Filled 2023-05-14: qty 30, 30d supply, fill #0

## 2023-05-14 MED ORDER — BUDESONIDE-FORMOTEROL FUMARATE 160-4.5 MCG/ACT IN AERO
2.0000 | INHALATION_SPRAY | Freq: Two times a day (BID) | RESPIRATORY_TRACT | 12 refills | Status: DC
  Filled 2023-05-14: qty 10.2, 30d supply, fill #0

## 2023-05-14 MED ORDER — PANTOPRAZOLE SODIUM 40 MG PO TBEC: 40.0000 mg | DELAYED_RELEASE_TABLET | Freq: Two times a day (BID) | ORAL | 2 refills | Status: DC

## 2023-05-14 MED ORDER — LIDOCAINE-PRILOCAINE 2.5-2.5 % EX CREA: 1.0000 | TOPICAL_CREAM | CUTANEOUS | Status: DC | PRN

## 2023-05-14 MED ORDER — ALBUTEROL SULFATE (2.5 MG/3ML) 0.083% IN NEBU
2.5000 mg | INHALATION_SOLUTION | Freq: Four times a day (QID) | RESPIRATORY_TRACT | 1 refills | Status: DC | PRN
  Filled 2023-05-14: qty 90, 8d supply, fill #0

## 2023-05-14 MED ORDER — LIDOCAINE HCL (PF) 1 % IJ SOLN: 5.0000 mL | INTRAMUSCULAR | Status: DC | PRN

## 2023-05-14 MED ORDER — ALLOPURINOL 100 MG PO TABS
100.0000 mg | ORAL_TABLET | Freq: Every day | ORAL | 1 refills | Status: DC
  Filled 2023-05-14: qty 30, 30d supply, fill #0

## 2023-05-14 MED ORDER — INCRUSE ELLIPTA 62.5 MCG/ACT IN AEPB
1.0000 | INHALATION_SPRAY | Freq: Every day | RESPIRATORY_TRACT | 3 refills | Status: DC
  Filled 2023-05-14: qty 30, 30d supply, fill #0

## 2023-05-14 MED ORDER — THIAMINE HCL 100 MG PO TABS
100.0000 mg | ORAL_TABLET | Freq: Every morning | ORAL | 2 refills | Status: DC
  Filled 2023-05-14: qty 30, 30d supply, fill #0

## 2023-05-14 MED ORDER — FLUTICASONE PROPIONATE 50 MCG/ACT NA SUSP
1.0000 | Freq: Every day | NASAL | 2 refills | Status: DC
  Filled 2023-05-14: qty 16, 30d supply, fill #0

## 2023-05-14 MED ORDER — ACETAMINOPHEN 500 MG PO TABS: 500.0000 mg | ORAL_TABLET | ORAL | Status: DC

## 2023-05-14 MED ORDER — CLOPIDOGREL BISULFATE 75 MG PO TABS
75.0000 mg | ORAL_TABLET | Freq: Every day | ORAL | 3 refills | Status: DC
  Filled 2023-05-14: qty 30, 30d supply, fill #0

## 2023-05-14 MED ORDER — INSULIN LISPRO (1 UNIT DIAL) 100 UNIT/ML (KWIKPEN)
PEN_INJECTOR | SUBCUTANEOUS | 11 refills | Status: DC
  Filled 2023-05-14: qty 15, 30d supply, fill #0

## 2023-05-14 MED ORDER — PENTAFLUOROPROP-TETRAFLUOROETH EX AERO: 1.0000 | INHALATION_SPRAY | CUTANEOUS | Status: DC | PRN

## 2023-05-14 MED ORDER — ALBUTEROL SULFATE HFA 108 (90 BASE) MCG/ACT IN AERS
2.0000 | INHALATION_SPRAY | Freq: Four times a day (QID) | RESPIRATORY_TRACT | 1 refills | Status: DC | PRN
  Filled 2023-05-14: qty 18, 30d supply, fill #0

## 2023-05-14 MED ORDER — DOCUSATE SODIUM 100 MG PO CAPS
100.0000 mg | ORAL_CAPSULE | Freq: Every day | ORAL | 0 refills | Status: DC
  Filled 2023-05-14: qty 100, 100d supply, fill #0

## 2023-05-14 NOTE — Discharge Summary (Signed)
PATIENT DETAILS Name: [name] [name] Age: 72 y.o. Sex: female Date of Birth: [birth year] MRN: [id number]. Admitting Physician: Briscoe Deutscher, MD LKG:[id number], [name], FNP  Admit Date: 05/06/2023 Discharge date: 05/14/2023  Recommendations for Outpatient Follow-up:  Follow up with PCP in 1-2 weeks Please obtain CMP/CBC in one week Ensure follow-up with dialysis clinic  Admitted From:  Home  Disposition: Home health (SNF was denied by insurance company)   Discharge Condition: good  CODE STATUS:   Code Status: Full Code   Diet recommendation:  Diet Order             Diet - low sodium heart healthy           Diet Carb Modified           Diet renal with fluid restriction Fluid restriction: 1200 mL Fluid; Room service appropriate? Yes with Assist; Fluid consistency: Thin  Diet effective now           Diet general                    Brief Summary: Patient is a 72 y.o.  female with history of COPD, non-small cell cancer-s/p curative radiation (completed April 06, 2022), ESRD on HD, HTN, DM-2, chronic combined systolic/diastolic heart failure, OSA with CPAP intolerance-presented with worsening shortness of breath-found to have COPD exacerbation with worsening hypercarbia-subsequently admitted to the hospitalist service.  See below for further details.   Significant events: 7/29>> admit to [facility]   Significant studies: 7/29>> CT angio chest: No PE.  Vascular congestion but no overt edema.   Significant microbiology data: 7/29>> COVID PCR: Negative   Procedures: None   Consults: Nephrology  Brief Hospital Course: COPD exacerbation Acute on chronic hypoxic/hypercarbic respiratory failure (on home O2-2-3 L) Overall improved  Down to 2 L of oxygen which is at her baseline Continue tapering steroids for a few more days Continue bronchodilators Minimize sedating medications as much as possible.   Acute on chronic combined diastolic/systolic heart failure in the  setting of ESRD Significantly better after dialysis x 2-resume usual dialysis postdischarge.    ESRD on HD TTS Nephrology followed closely-resume prior dialysis schedule postdischarge.  OSA Intolerant to CPAP On home oxygen   History of lung cancer-s/p curative radiation Resume outpatient follow-up with radiation/medical oncology   History of PAD Plavix   Hypothyroidism Synthroid   Uncontrolled DM-2-with steroid-induced hyperglycemia (A1c 7.1 on 7/6) CBGs now stable-continue SSI  Debility/deconditioning Worsened by acute illness Per patient-she is minimally ambulatory and mostly bed to wheelchair bound PT/OT eval-SNF recommended-however this was denied by SNF-patient is being discharged home with maximal home health services.  Received OA involving bilateral knees Per patient's son-patient is being evaluated for knee replacement by orthopedics. Supportive care in the interim.   Obesity: Estimated body mass index is 38.94 kg/m as calculated from the following:   Height as of this encounter: 5\' 3"  (1.6 m).   Weight as of this encounter: 99.7 kg.    Discharge Diagnoses:  Principal Problem:   Acute on chronic respiratory failure with hypercapnia (HCC) Active Problems:   HTN (hypertension)   DM (diabetes mellitus), type 2 with renal complications (HCC)   Acute on chronic combined systolic and diastolic CHF (congestive heart failure) (HCC)   Chronic pain syndrome   COPD exacerbation (HCC)   ESRD (end stage renal disease) on dialysis (HCC)   Lung cancer, lower [facility])   Discharge Instructions:  Activity:  As tolerated with Full  fall precautions use walker/cane & assistance as needed   Discharge Instructions     Call MD for:  difficulty breathing, headache or visual disturbances   Complete by: As directed    Call MD for:  persistant nausea and vomiting   Complete by: As directed    Diet - low sodium heart healthy   Complete by: As directed    Diet Carb  Modified   Complete by: As directed    Diet general   Complete by: As directed    Discharge instructions   Complete by: As directed    Follow with Primary MD  Vladimir Crofts, FNP in 1-2 weeks  Please get a complete blood count and chemistry panel checked by your Primary MD at your next visit, and again as instructed by your Primary MD.  Get Medicines reviewed and adjusted: Please take all your medications with you for your next visit with your Primary MD  Laboratory/radiological data: Please request your Primary MD to go over all hospital tests and procedure/radiological results at the follow up, please ask your Primary MD to get all Hospital records sent to his/her office.  In some cases, they will be blood work, cultures and biopsy results pending at the time of your discharge. Please request that your primary care M.D. follows up on these results.  Also Note the following: If you experience worsening of your admission symptoms, develop shortness of breath, life threatening emergency, suicidal or homicidal thoughts you must seek medical attention immediately by calling 911 or calling your MD immediately  if symptoms less severe.  You must read complete instructions/literature along with all the possible adverse reactions/side effects for all the Medicines you take and that have been prescribed to you. Take any new Medicines after you have completely understood and accpet all the possible adverse reactions/side effects.   Do not drive when taking Pain medications or sleeping medications (Benzodaizepines)  Do not take more than prescribed Pain, Sleep and Anxiety Medications. It is not advisable to combine anxiety,sleep and pain medications without talking with your primary care practitioner  Special Instructions: If you have smoked or chewed Tobacco  in the last 2 yrs please stop smoking, stop any regular Alcohol  and or any Recreational drug use.  Wear Seat belts while  driving.  Please note: You were cared for by a hospitalist during your hospital stay. Once you are discharged, your primary care physician will handle any further medical issues. Please note that NO REFILLS for any discharge medications will be authorized once you are discharged, as it is imperative that you return to your primary care physician (or establish a relationship with a primary care physician if you do not have one) for your post hospital discharge needs so that they can reassess your need for medications and monitor your lab values.   Increase activity slowly   Complete by: As directed    Increase activity slowly   Complete by: As directed    No wound care   Complete by: As directed    No wound care   Complete by: As directed       Allergies as of 05/14/2023       Reactions   Nebivolol Swelling   Chest pain (reaction to Bystolic)   Zestril [lisinopril] Swelling, Other (See Comments)   Ace Inhibitors Swelling, Other (See Comments)   Tongue swell   Morphine And Codeine Itching        Medication List  STOP taking these medications    cetirizine 10 MG tablet Commonly known as: ZYRTEC   insulin aspart 100 UNIT/ML injection Commonly known as: novoLOG   ipratropium 0.06 % nasal spray Commonly known as: ATROVENT   oxycodone 5 MG capsule Commonly known as: OXY-IR   polyethylene glycol 17 g packet Commonly known as: MIRALAX / GLYCOLAX       TAKE these medications    acetaminophen 500 MG tablet Commonly known as: TYLENOL Take 1 tablet (500 mg total) by mouth See admin instructions. Take 1,000mg  (2 tablets) by mouth twice a day in addition to 1,000mg  (2 tablets) twice daily as needed for pain. What changed: how much to take   albuterol (2.5 MG/3ML) 0.083% nebulizer solution Commonly known as: PROVENTIL Take 3 mLs (2.5 mg total) by nebulization every 6 (six) hours as needed for wheezing or shortness of breath.   albuterol 108 (90 Base) MCG/ACT  inhaler Commonly known as: VENTOLIN HFA Inhale 2 puffs into the lungs every 6 (six) hours as needed for shortness of breath (COPD).   allopurinol 100 MG tablet Commonly known as: ZYLOPRIM Take 1 tablet (100 mg total) by mouth daily.   budesonide-formoterol 160-4.5 MCG/ACT inhaler Commonly known as: SYMBICORT Inhale 2 puffs into the lungs in the morning and at bedtime.   carboxymethylcellulose 0.5 % Soln Commonly known as: REFRESH PLUS Place 1 drop into both eyes 3 (three) times daily as needed (dry/irritated eyes.).   carvedilol 6.25 MG tablet Commonly known as: COREG Take 1 tablet (6.25 mg total) by mouth 2 (two) times daily with a meal.   clopidogrel 75 MG tablet Commonly known as: PLAVIX Take 1 tablet (75 mg total) by mouth daily.   diclofenac Sodium 1 % Gel Commonly known as: VOLTAREN Apply 2 g topically 4 (four) times daily.   Docusate Sodium 100 MG capsule Take 1 tablet (100 mg total) by mouth daily.   feeding supplement (NEPRO CARB STEADY) Liqd Take 237 mLs by mouth 2 (two) times daily between meals.   fluticasone 50 MCG/ACT nasal spray Commonly known as: FLONASE Place 1 spray into both nostrils daily.   Incruse Ellipta 62.5 MCG/ACT Aepb Generic drug: umeclidinium bromide Inhale 1 puff into the lungs daily.   insulin lispro 100 UNIT/ML KwikPen Commonly known as: HumaLOG KwikPen 0-15 Units, Subcutaneous, 3 times daily with meal CBG < 70: Implement Hypoglycemia meausers CBG 70 - 120: 0 units CBG 121 - 150: 2 units CBG 151 - 200: 3 units CBG 201 - 250: 5 units CBG 251 - 300: 8 units CBG 301 - 350: 11 units CBG 351 - 400: 15 units CBG > 400: call MD   levothyroxine 175 MCG tablet Commonly known as: SYNTHROID Take 1 tablet (175 mcg total) by mouth daily.   lidocaine 4 % Place 2 patches onto the skin daily. Each lower side of back.   lidocaine-prilocaine cream Commonly known as: EMLA Apply 1 Application topically See admin instructions. On dialysis days  (Tues,Thurs,Sat)   multivitamins with iron Tabs tablet Take 1 tablet by mouth in the morning.   OXYGEN Inhale into the lungs.   pantoprazole 40 MG tablet Commonly known as: PROTONIX Take 1 tablet (40 mg total) by mouth 2 (two) times daily before a meal.   sevelamer carbonate 800 MG tablet Commonly known as: RENVELA Take 2 tablets (1,600 mg total) by mouth 3 (three) times daily with meals.   thiamine 100 MG tablet Commonly known as: Vitamin B-1 Take 1 tablet (100 mg total) by  mouth in the morning.               Durable Medical Equipment  (From admission, onward)           Start     Ordered   05/14/23 0847  For home use only DME Other see comment  Once       Comments: HOYER LIFT  Question:  Length of Need  Answer:  Lifetime   05/14/23 0847            Follow-up Information     Vladimir Crofts, FNP. Schedule an appointment as soon as possible for a visit in 1 week(s).   Specialty: Family Medicine Contact information: [address] [place] [address] [phone]                Allergies  Allergen Reactions   Nebivolol Swelling    Chest pain (reaction to Bystolic)   Zestril [Lisinopril] Swelling and Other (See Comments)   Ace Inhibitors Swelling and Other (See Comments)    Tongue swell   Morphine And Codeine Itching     Other Procedures/Studies: CT Angio Chest PE W and/or Wo Contrast  Result Date: 05/06/2023 CLINICAL DATA:  Short of breath, chest pain EXAM: CT ANGIOGRAPHY CHEST WITH CONTRAST TECHNIQUE: Multidetector CT imaging of the chest was performed using the standard protocol during bolus administration of intravenous contrast. Multiplanar CT image reconstructions and MIPs were obtained to evaluate the vascular anatomy. RADIATION DOSE REDUCTION: This exam was performed according to the departmental dose-optimization program which includes automated exposure control, adjustment of the mA and/or kV according to patient size  and/or use of iterative reconstruction technique. CONTRAST:  75mL OMNIPAQUE IOHEXOL 350 MG/ML SOLN COMPARISON:  05/06/2023, 11/09/2022 FINDINGS: Cardiovascular: This is a technically adequate evaluation of the pulmonary vasculature. No filling defects or pulmonary emboli. The heart is enlarged without pericardial effusion. No evidence of thoracic aortic aneurysm or dissection. Atherosclerosis of the aorta and coronary vasculature. Mediastinum/Nodes: No enlarged mediastinal, hilar, or axillary lymph nodes. Thyroid gland, trachea, and esophagus demonstrate no significant findings. Lungs/Pleura: Left pleural effusion with dense left lower lobe consolidation consistent with compressive atelectasis. This accounts for the density seen at the left lung base on chest x-ray. Central vascular prominence. No airspace disease or pneumothorax. Dependent hypoventilatory changes right lower lobe. Central airways are patent. Upper Abdomen: No acute abnormality. Musculoskeletal: Multiple chronic healed bilateral rib fractures. No acute bony abnormality. Reconstructed images demonstrate no additional findings. Review of the MIP images confirms the above findings. IMPRESSION: 1. No evidence of pulmonary embolus. 2. Cardiomegaly and central pulmonary vascular congestion without overt edema. 3. Left pleural effusion, with compressive left lower lobe atelectasis. 4. Aortic Atherosclerosis (ICD10-I70.0). Coronary artery atherosclerosis. Electronically Signed   By: Sharlet Salina M.D.   On: 05/06/2023 23:03   DG Chest Port 1 View  Result Date: 05/06/2023 CLINICAL DATA:  Short of breath EXAM: PORTABLE CHEST 1 VIEW COMPARISON:  04/13/2023 FINDINGS: Single frontal view of the chest demonstrates marked enlargement of the cardiac silhouette. Stable atherosclerosis of the aorta. Increased central vascular congestion, with increased interstitial prominence concerning for edema. Retrocardiac density may reflect left basilar consolidation or  effusion. No pneumothorax. IMPRESSION: 1. Constellation of findings most consistent with congestive heart failure. 2. Increased density left lung base consistent with consolidation and effusion. Favor atelectasis over pneumonia. Electronically Signed   By: Sharlet Salina M.D.   On: 05/06/2023 20:50   ECHOCARDIOGRAM COMPLETE  Result Date: 04/15/2023  ECHOCARDIOGRAM REPORT   Patient Name:   KAMYLA MONCUR Date of Exam: 04/15/2023 Medical Rec #:  [id number]         Height:       63.0 in Accession #:    [id number]        Weight:       226.6 lb Date of Birth:  Feb 17, 1951          BSA:          2.039 m Patient Age:    72 years          BP:           118/50 mmHg Patient Gender: F                 HR:           113 bpm. Exam Location:  Inpatient Procedure: 2D Echo, Cardiac Doppler, Color Doppler and Intracardiac            Opacification Agent Indications:    CHF-Acute Diastolic I50.31  History:        Patient has prior history of Echocardiogram examinations, most                 recent 11/22/2021. CHF, PAD, TIA and COPD, Signs/Symptoms:Chest                 Pain and Syncope; Risk Factors:Hypertension, Sleep Apnea,                 Diabetes, Dyslipidemia and Current Smoker. ESRD.  Sonographer:    Lucendia Herrlich Referring Phys: [id number] PRATIK D [facility] IMPRESSIONS  1. Left ventricular ejection fraction, by estimation, is 35 to 40%. The left ventricle has moderately decreased function. The left ventricle demonstrates global hypokinesis. The left ventricular internal cavity size was mildly dilated. Left ventricular diastolic parameters are consistent with Grade I diastolic dysfunction (impaired relaxation).  2. Right ventricular systolic function is normal. The right ventricular size is normal. There is moderately elevated pulmonary artery systolic pressure. The estimated right ventricular systolic pressure is 47.3 mmHg.  3. Left atrial size was mildly dilated.  4. The mitral valve is normal in structure. Trivial mitral valve  regurgitation. No evidence of mitral stenosis.  5. The aortic valve is tricuspid. Aortic valve regurgitation is not visualized. No aortic stenosis is present. Aortic valve mean gradient measures 7.0 mmHg.  6. The inferior vena cava is dilated in size with <50% respiratory variability, suggesting right atrial pressure of 15 mmHg. FINDINGS  Left Ventricle: Left ventricular ejection fraction, by estimation, is 35 to 40%. The left ventricle has moderately decreased function. The left ventricle demonstrates global hypokinesis. Definity contrast agent was given IV to delineate the left ventricular endocardial borders. The left ventricular internal cavity size was mildly dilated. There is no left ventricular hypertrophy. Left ventricular diastolic parameters are consistent with Grade I diastolic dysfunction (impaired relaxation). Right Ventricle: The right ventricular size is normal. No increase in right ventricular wall thickness. Right ventricular systolic function is normal. There is moderately elevated pulmonary artery systolic pressure. The tricuspid regurgitant velocity is 2.84 m/s, and with an assumed right atrial pressure of 15 mmHg, the estimated right ventricular systolic pressure is 47.3 mmHg. Left Atrium: Left atrial size was mildly dilated. Right Atrium: Right atrial size was normal in size. Pericardium: There is no evidence of pericardial effusion. Mitral Valve: The mitral valve is normal in structure. Trivial mitral valve regurgitation. No evidence of mitral valve stenosis. Tricuspid Valve: The  tricuspid valve is normal in structure. Tricuspid valve regurgitation is trivial. Aortic Valve: The aortic valve is tricuspid. Aortic valve regurgitation is not visualized. No aortic stenosis is present. Aortic valve mean gradient measures 7.0 mmHg. Aortic valve peak gradient measures 13.4 mmHg. Aortic valve area, by VTI measures 2.52  cm. Pulmonic Valve: The pulmonic valve was normal in structure. Pulmonic valve  regurgitation is not visualized. Aorta: The aortic root is normal in size and structure. Venous: The inferior vena cava is dilated in size with less than 50% respiratory variability, suggesting right atrial pressure of 15 mmHg. IAS/Shunts: No atrial level shunt detected by color flow Doppler.  LEFT VENTRICLE PLAX 2D LVIDd:         5.90 cm   Diastology LVIDs:         5.00 cm   LV e' medial:    6.64 cm/s LV PW:         1.10 cm   LV E/e' medial:  13.4 LV IVS:        0.90 cm   LV e' lateral:   8.05 cm/s LVOT diam:     2.00 cm   LV E/e' lateral: 11.1 LV SV:         81 LV SV Index:   40 LVOT Area:     3.14 cm  RIGHT VENTRICLE             IVC RV S prime:     12.10 cm/s  IVC diam: 2.30 cm TAPSE (M-mode): 2.1 cm LEFT ATRIUM           Index        RIGHT ATRIUM           Index LA diam:      4.90 cm 2.40 cm/m   RA Area:     13.50 cm LA Vol (A2C): 50.1 ml 24.57 ml/m  RA Volume:   31.40 ml  15.40 ml/m LA Vol (A4C): 78.0 ml 38.25 ml/m  AORTIC VALVE AV Area (Vmax):    2.48 cm AV Area (Vmean):   2.45 cm AV Area (VTI):     2.52 cm AV Vmax:           183.00 cm/s AV Vmean:          125.000 cm/s AV VTI:            0.322 m AV Peak Grad:      13.4 mmHg AV Mean Grad:      7.0 mmHg LVOT Vmax:         144.67 cm/s LVOT Vmean:        97.333 cm/s LVOT VTI:          0.258 m LVOT/AV VTI ratio: 0.80  AORTA Ao Root diam: 3.00 cm Ao Asc diam:  3.10 cm MITRAL VALVE                TRICUSPID VALVE MV Area (PHT): 4.49 cm     TR Peak grad:   32.3 mmHg MV Decel Time: 169 msec     TR Vmax:        284.00 cm/s MV E velocity: 89.30 cm/s MV A velocity: 108.00 cm/s  SHUNTS MV E/A ratio:  0.83         Systemic VTI:  0.26 m                             Systemic Diam: 2.00 cm Dalton  McleanMD Electronically signed by Wilfred Lacy Signature Date/Time: 04/15/2023/5:22:34 PM    Final      TODAY-DAY OF DISCHARGE:  Subjective:   [name] [name] today has no headache,no chest abdominal pain,no new weakness tingling or numbness, feels much better wants to  go home today.   Objective:   Blood pressure 133/60, pulse 90, temperature 98.4 F (36.9 C), temperature source Oral, resp. rate (!) 23, height 5\' 3"  (1.6 m), weight 102 kg, SpO2 100%.  Intake/Output Summary (Last 24 hours) at 05/14/2023 1108 Last data filed at 05/14/2023 0930 Gross per 24 hour  Intake 3 ml  Output -553.47 ml  Net 556.47 ml   Filed Weights   05/11/23 0750 05/11/23 1232 05/14/23 0847  Weight: 101.1 kg 99.7 kg 102 kg    Exam: Awake Alert, Oriented *3, No new F.N deficits, Normal affect [place].AT,PERRAL Supple Neck,No JVD, No cervical lymphadenopathy appriciated.  Symmetrical Chest wall movement, Good air movement bilaterally, CTAB RRR,No Gallops,Rubs or new Murmurs, No Parasternal Heave +ve B.Sounds, Abd Soft, Non tender, No organomegaly appriciated, No rebound -guarding or rigidity. No Cyanosis, Clubbing or edema, No new Rash or bruise   PERTINENT RADIOLOGIC STUDIES: No results found.   PERTINENT LAB RESULTS: CBC: No results for input(s): "WBC", "HGB", "HCT", "PLT" in the last 72 hours.  CMET CMP     Component Value Date/Time   NA 130 (L) 05/11/2023 0614   NA 130 (L) 05/11/2023 0614   NA 146 (H) 12/23/2013 1132   K 4.8 05/11/2023 0614   K 4.8 05/11/2023 0614   CL 92 (L) 05/11/2023 0614   CL 91 (L) 05/11/2023 0614   CO2 28 05/11/2023 0614   CO2 26 05/11/2023 0614   GLUCOSE 130 (H) 05/11/2023 0614   GLUCOSE 128 (H) 05/11/2023 0614   BUN 76 (H) 05/11/2023 0614   BUN 76 (H) 05/11/2023 0614   BUN 31 (H) 12/23/2013 1132   CREATININE 6.81 (H) 05/11/2023 0614   CREATININE 6.71 (H) 05/11/2023 0614   CREATININE 4.65 (HH) 11/09/2022 1000   CREATININE 1.32 (H) 06/29/2014 1100   CALCIUM 9.4 05/11/2023 0614   CALCIUM 9.3 05/11/2023 0614   CALCIUM 9.1 09/07/2021 1036   PROT 6.8 05/06/2023 2111   PROT 7.3 12/23/2013 1132   ALBUMIN 3.1 (L) 05/11/2023 0614   ALBUMIN 4.1 12/23/2013 1132   AST 23 05/06/2023 2111   AST 14 (L) 11/09/2022 1000   ALT 14 05/06/2023  2111   ALT 9 11/09/2022 1000   ALKPHOS 126 05/06/2023 2111   BILITOT 0.6 05/06/2023 2111   BILITOT 0.3 11/09/2022 1000   GFRNONAA 6 (L) 05/11/2023 0614   GFRNONAA 6 (L) 05/11/2023 0614   GFRNONAA 9 (L) 11/09/2022 1000    GFR Estimated Creatinine Clearance: 8.5 mL/min (A) (by C-G formula based on SCr of 6.81 mg/dL (H)). No results for input(s): "LIPASE", "AMYLASE" in the last 72 hours. No results for input(s): "CKTOTAL", "CKMB", "CKMBINDEX", "TROPONINI" in the last 72 hours. Invalid input(s): "POCBNP" No results for input(s): "DDIMER" in the last 72 hours. No results for input(s): "HGBA1C" in the last 72 hours. No results for input(s): "CHOL", "HDL", "LDLCALC", "TRIG", "CHOLHDL", "LDLDIRECT" in the last 72 hours. No results for input(s): "TSH", "T4TOTAL", "T3FREE", "THYROIDAB" in the last 72 hours.  Invalid input(s): "FREET3" No results for input(s): "VITAMINB12", "FOLATE", "FERRITIN", "TIBC", "IRON", "RETICCTPCT" in the last 72 hours. Coags: No results for input(s): "INR" in the last 72 hours.  Invalid input(s): "PT" Microbiology: Recent Results (from the past 240 hour(s))  SARS Coronavirus 2 by RT PCR (hospital order, performed in [facility] hospital lab) *cepheid single result test* Anterior Nasal Swab     Status: None   Collection Time: 05/06/23  7:38 PM   Specimen: Anterior Nasal Swab  Result Value Ref Range Status   SARS Coronavirus 2 by RT PCR NEGATIVE NEGATIVE Final    Comment: Performed at [facility], [address], [place], [place] [address]    FURTHER DISCHARGE INSTRUCTIONS:  Get Medicines reviewed and adjusted: Please take all your medications with you for your next visit with your Primary MD  Laboratory/radiological data: Please request your Primary MD to go over all hospital tests and procedure/radiological results at the follow up, please ask your Primary MD to get all Hospital records sent to his/her office.  In some cases, they will be blood work,  cultures and biopsy results pending at the time of your discharge. Please request that your primary care M.D. goes through all the records of your hospital data and follows up on these results.  Also Note the following: If you experience worsening of your admission symptoms, develop shortness of breath, life threatening emergency, suicidal or homicidal thoughts you must seek medical attention immediately by calling 911 or calling your MD immediately  if symptoms less severe.  You must read complete instructions/literature along with all the possible adverse reactions/side effects for all the Medicines you take and that have been prescribed to you. Take any new Medicines after you have completely understood and accpet all the possible adverse reactions/side effects.   Do not drive when taking Pain medications or sleeping medications (Benzodaizepines)  Do not take more than prescribed Pain, Sleep and Anxiety Medications. It is not advisable to combine anxiety,sleep and pain medications without talking with your primary care practitioner  Special Instructions: If you have smoked or chewed Tobacco  in the last 2 yrs please stop smoking, stop any regular Alcohol  and or any Recreational drug use.  Wear Seat belts while driving.  Please note: You were cared for by a hospitalist during your hospital stay. Once you are discharged, your primary care physician will handle any further medical issues. Please note that NO REFILLS for any discharge medications will be authorized once you are discharged, as it is imperative that you return to your primary care physician (or establish a relationship with a primary care physician if you do not have one) for your post hospital discharge needs so that they can reassess your need for medications and monitor your lab values.  Total Time spent coordinating discharge including counseling, education and face to face time equals greater than 30 minutes.  Signed:    05/14/2023 11:08 AM

## 2023-05-14 NOTE — Progress Notes (Addendum)
[name] [name] Progress Note   Subjective:    Seen and examined patient about to start HD. BP is 140/48. Noted having some issues with cannulating and appears a clot was pulled today from access, will monitor this closely for reoccurrence. Otherwise, no other acute issues. Spoke with the Hospitalist. Plan for patient to go home today with home health services. Apparently her insurance will no longer pay for outpatient rehab services.  Objective Vitals:   05/14/23 0847 05/14/23 0906 05/14/23 0908 05/14/23 0930  BP: (!) 140/48  (!) 130/53 111/60  Pulse: 82 82 84   Resp: 20 (!) 22 20 19   Temp: 98.4 F (36.9 C)     TempSrc: Oral     SpO2: 94% 95% 96% 96%  Weight: 102 kg     Height:       Physical Exam General: Chronically ill appearing female in NAD Heart: S1,S2 No M/R/G. SR on monitor.  Lungs: Bilateral breath sounds decreased in bases otherwise CTAB. Respirations shallow no WOB.  Abdomen: Obese, NABS.  Extremities: Woody appearance BLE with trace pedal edema Dialysis Access: L AVG + T/B  Filed Weights   05/11/23 0750 05/11/23 1232 05/14/23 0847  Weight: 101.1 kg 99.7 kg 102 kg    Intake/Output Summary (Last 24 hours) at 05/14/2023 1008 Last data filed at 05/14/2023 0930 Gross per 24 hour  Intake 3 ml  Output -553.47 ml  Net 556.47 ml    Additional Objective Labs: Basic Metabolic Panel: Recent Labs  Lab 05/08/23 0058 05/09/23 0527 05/11/23 0614  NA 132* 130* 130*  130*  K 4.0 4.3 4.8  4.8  CL 90* 88* 91*  92*  CO2 28 25 26  28   GLUCOSE 174* 159* 128*  130*  BUN 40* 68* 76*  76*  CREATININE 5.11* 7.06* 6.71*  6.81*  CALCIUM 8.9 8.8* 9.3  9.4  PHOS 5.2*  --  4.5   Liver Function Tests: Recent Labs  Lab 05/08/23 0058 05/11/23 0614  ALBUMIN 3.2* 3.1*   No results for input(s): "LIPASE", "AMYLASE" in the last 168 hours. CBC: Recent Labs  Lab 05/08/23 0057 05/09/23 0527 05/11/23 0614  WBC 12.0* 7.9 8.3  HGB 10.0* 9.9* 9.6*  HCT 34.4*  33.7* 32.4*  MCV 102.7* 103.1* 102.5*  PLT 230 269 248   Blood Culture    Component Value Date/Time   SDES BLOOD RIGHT HAND 11/27/2021 0929   SPECREQUEST  11/27/2021 0929    BOTTLES DRAWN AEROBIC AND ANAEROBIC Blood Culture adequate volume   CULT  11/27/2021 0929    NO GROWTH 5 DAYS Performed at [facility], [address], [place], [place] [address]    REPTSTATUS 12/02/2021 FINAL 11/27/2021 1914    Cardiac Enzymes: No results for input(s): "CKTOTAL", "CKMB", "CKMBINDEX", "TROPONINI" in the last 168 hours. CBG: Recent Labs  Lab 05/13/23 0813 05/13/23 1218 05/13/23 1621 05/13/23 2121 05/14/23 0805  GLUCAP 188* 190* 179* 166* 140*   Iron Studies: No results for input(s): "IRON", "TIBC", "TRANSFERRIN", "FERRITIN" in the last 72 hours. Lab Results  Component Value Date   INR 1.0 07/01/2021   INR 1.0 01/05/2021   INR 1.2 01/06/2020   Studies/Results: No results found.  Medications:   allopurinol  100 mg Oral Daily   arformoterol  15 mcg Nebulization BID   budesonide (PULMICORT) nebulizer solution  0.25 mg Nebulization BID   carvedilol  6.25 mg Oral BID WC   Chlorhexidine Gluconate Cloth  6 each Topical Q0600   clopidogrel  75 mg Oral Daily   doxercalciferol  3 mcg Intravenous Q T,Th,Sa-HD   heparin  5,000 Units Subcutaneous Q8H   insulin aspart  0-15 Units Subcutaneous TID WC   insulin aspart  0-5 Units Subcutaneous QHS   levothyroxine  175 mcg Oral Daily   pantoprazole  40 mg Oral BID AC   polyethylene glycol  17 g Oral Daily   revefenacin  175 mcg Nebulization Daily   sevelamer carbonate  1,600 mg Oral TID WC   sodium chloride flush  3 mL Intravenous Q12H    Dialysis Orders: [facility] T,Th,S 4 hrs 180NRe 400/600 100.8 kgs 2.0 K/ 2.0 Ca  - Heparin 2000 units IV three times per week - Mircera 50 mcg IV q 2 weeks Last dose 05/04/2023 Next dose due 05/18/2023 - Venofer 50 mg IV q week - Hectorol 3 mcg IV three times per week  Assessment/Plan: 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 Management per primary  Combined systolic & diastolic HF- Lower volume and optimize volume status with dialysis. Lower EDW on discharge.  ESRD -HD T,Th, S-On HD Hypertension/volume  - BP controlled. Net UF 2.5 Liters with HD 05/11/2023. Continue lowering volume as tolerated.  Anemia  - hgb  9s  ( just had esa 7/27) Follow HGB  Metabolic bone disease -  Ca corec  ok, continue Hectorol, Phos binders  with diet   DM  Type 2-per primary HO  lung cancer-s/p curative radiation Albumin 3.1. Protein supplements.  Constipation-suppository ordered. Change miralax to daily.  Dispo - Okay for discharge from renal standpoint  [name] Holmes, NP [name] 05/14/2023,10:08 AM  LOS: 7 days

## 2023-05-14 NOTE — Progress Notes (Signed)
   05/13/23 2010  BiPAP/CPAP/SIPAP  BiPAP/CPAP/SIPAP Pt Type Adult  BiPAP/CPAP/SIPAP Resmed  Reason BIPAP/CPAP not in use  (pt refused)

## 2023-05-14 NOTE — Progress Notes (Signed)
When cannulating arterial needle clot was pulled from access.

## 2023-05-14 NOTE — Progress Notes (Signed)
Received patient in bed to unit.  Alert and oriented.  Informed consent signed and in chart.   TX duration:3:22  Patient tolerated well.  Transported back to the room  Alert, without acute distress.  Hand-off given to patient's nurse.   Access used: left AVG Access issues: alarmed a lot during tx, tx terminated due to clotting by another RN, clots were pulled from arterial when cannulating  Total UF removed: 2L Medication(s) given: hectorol   05/14/23 1347  Vitals  Temp 97.8 F (36.6 C)  Temp Source Oral  BP 125/68  MAP (mmHg) 82  BP Location Right Arm  BP Method Automatic  Pulse Rate 85  ECG Heart Rate 88  Resp 20  Oxygen Therapy  SpO2 99 %  O2 Device Nasal Cannula  O2 Flow Rate (L/min) 3 L/min  During Treatment Monitoring  Blood Flow Rate (mL/min) 300 mL/min  Arterial Pressure (mmHg) -133.75 mmHg  Venous Pressure (mmHg) 403.41 mmHg  TMP (mmHg) 7.68 mmHg  Ultrafiltration Rate (mL/min) 1525 mL/min  Dialysate Flow Rate (mL/min) 300 ml/min  HD Safety Checks Performed Yes  Intra-Hemodialysis Comments Tx completed  Dialysis Fluid Bolus Normal Saline  Bolus Amount (mL) 300 mL  Dialysate Potassium Concentration 2  Dialysate Calcium Concentration 2.5  Post Treatment  Dialyzer Clearance Lightly streaked  Duration of HD Treatment -hour(s) 3.22 hour(s)  Liters Processed 68  Fluid Removed (mL) 2079.73 mL  Tolerated HD Treatment Yes  AVG/AVF Arterial Site Held (minutes) 5 minutes  AVG/AVF Venous Site Held (minutes) 5 minutes  Fistula / Graft Left Upper arm Arteriovenous vein graft  Placement Date/Time: 12/01/21 1444   Placed prior to admission: No  Orientation: Left  Access Location: Upper arm  Access Type: Arteriovenous vein graft  Site Condition No complications  Fistula / Graft Assessment Thrill;Bruit;Present  Status Deaccessed       [facility]

## 2023-05-14 NOTE — TOC Transition Note (Signed)
Transition of Care [facility]) - CM/SW Discharge Note   Patient Details  Name: [name] [name] MRN: [id number] Date of Birth: [birth year]  [facility] CM/SW Contact:  Gordy Clement, RN Phone Number: 05/14/2023, 12:32 PM   Clinical Narrative:    Patient to DC to home today  Michiel Sites Lift has been delivered to home today by [employer]. No other DME needed  [facility] will transport at DC after patient completes HD. [facility] has been called        Barriers to Discharge: Continued Medical Work up, English as a second language teacher   Patient Goals and CMS Choice      Discharge Placement                         Discharge Plan and Services Additional resources added to the After Visit Summary for   In-house Referral: Clinical Social Work   Post Acute Care Choice: Skilled Nursing Facility                               Social Determinants of Health (SDOH) Interventions SDOH Screenings   Food Insecurity: No Food Insecurity (04/13/2023)  Housing: Medium Risk (04/13/2023)  Transportation Needs: No Transportation Needs (04/13/2023)  Utilities: Not At Risk (04/13/2023)  Tobacco Use: Medium Risk (05/07/2023)     Readmission Risk Interventions    05/09/2023    3:37 PM 05/08/2023   12:17 PM  Readmission Risk Prevention Plan  Transportation Screening Complete Complete  Medication Review (RN Care Manager) Complete Complete  PCP or Specialist appointment within 3-5 days of discharge Complete Complete  HRI or Home Care Consult Complete Complete  SW Recovery Care/Counseling Consult Complete Complete  Palliative Care Screening Not Applicable Not Applicable  Skilled Nursing Facility Complete Complete

## 2023-05-14 NOTE — Progress Notes (Signed)
D/C order noted. Contacted [facility] to advise clinic of pt's d/c today and that pt should resume care on Thursday.   Olivia Canter Renal Navigator [phone]

## 2023-05-14 NOTE — Progress Notes (Signed)
[facility] Progress note: - 2841: called for report, said still giving shift change. - will call back.

## 2023-05-14 NOTE — Plan of Care (Signed)
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  Completed By: Salome Holmes, NP   Reviewed by: MD:______ RN_______

## 2023-05-14 NOTE — TOC CM/SW Note (Signed)
RNCM received call from this Patient's Niece after patient left to go home on [facility] .  [name] [name]  [phone].   Niece stated that she had filed an appeal with [employer] yesterday 8/5 for her Aunts DC.    CM explained that her Aunts DC was not entered until today 8/6.   CM confirmed with CMA that [employer] had contacted CMA regarding attempted appeal - There was no DC order in .Paperwork was sent back to [facility] stating there was no DC order in.   CM was unaware of any attempted appeal made on 8/5    CM spoke with Son prior to DC. He  confirmed that he and Patient live together and he assists with caregiving.  Son confirmed that hoyer lift was delivered to the home today.   CM has set up Home Health with [facility] and [facility] services are to resume with [facility] [phone] 7 days a week for a total of 18 hours.   CM has received paperwork to have signed to assist in the process of patient receiving more hours from [facility] services. Will fax in am  8/7 after Provider signs   CM has called Niece back to explain all of the above  Voice Mail left with contact information.

## 2023-05-15 NOTE — TOC CM/SW Note (Signed)
CM received message from [facility] liaison that [name] [name] is not patients PCP.  CM called that office and they confirmed with CM that they are patient's Provider. CM called Liaison and let her know and gave her the following office number [phone] for [facility]

## 2023-05-16 ENCOUNTER — Telehealth: Payer: Self-pay | Admitting: Internal Medicine

## 2023-05-16 NOTE — Telephone Encounter (Signed)
Called tor reschedule 08/05 appointments, left a voicemail for patient.

## 2023-05-16 NOTE — TOC Transition Note (Signed)
Transition of Care - Initial Contact from Inpatient Facility  Date of discharge: 05/14/23 Date of contact: 05/16/23  Method: Phone Spoke to: Patient  Patient contacted to discuss transition of care from recent inpatient hospitalization. Patient was admitted to [facility] from 05/06/23-05/14/23 with discharge diagnosis of hypoxic/hypercarbic resp failure 2nd COPD exacerbation.  The discharge medication list was reviewed. Patient understands the changes and has no concerns.   Patient received HD at [facility] today. Next HD 8/10.  Patient reports wanting her chair time to be changed to first shift now that she is discharged home and no longer at the SNF. She reports already speaking with the HD Charge RN about this. I explained this all depends of chair time availability. She verbalized understanding.  Salome Holmes, NP

## 2023-05-29 ENCOUNTER — Telehealth: Payer: Self-pay | Admitting: Medical Oncology

## 2023-05-29 NOTE — Telephone Encounter (Signed)
When is pt's next appt.? Sees PCP soon.

## 2023-05-30 ENCOUNTER — Telehealth: Payer: Self-pay | Admitting: Medical Oncology

## 2023-05-30 NOTE — Telephone Encounter (Signed)
  Ct scan of chest - expected date changed to 09/04 and this nurse called [facility] and requested that they fax [name] [name]'s  recent lab results.    PET/CT  scan appt on Wed 08/28 Per Nuclear medicine the cardiac nurse will call pt /son with specific prep instructions for PET /CT cardiac study -Pt and son aware.  Schedule message sent to CS and Dr Asa Lente scheduler.

## 2023-06-03 ENCOUNTER — Other Ambulatory Visit: Payer: Self-pay | Admitting: Cardiology

## 2023-06-03 DIAGNOSIS — R079 Chest pain, unspecified: Secondary | ICD-10-CM

## 2023-06-04 ENCOUNTER — Telehealth: Payer: Self-pay | Admitting: Internal Medicine

## 2023-06-04 ENCOUNTER — Telehealth (HOSPITAL_COMMUNITY): Payer: Self-pay | Admitting: *Deleted

## 2023-06-04 ENCOUNTER — Other Ambulatory Visit: Payer: Self-pay | Admitting: Medical Oncology

## 2023-06-04 NOTE — Telephone Encounter (Signed)
Scheduled per 08/22 scheduling message, patient has been called and notified.

## 2023-06-04 NOTE — Telephone Encounter (Signed)
Reaching out to patient to offer assistance regarding upcoming cardiac imaging study; pt verbalizes understanding of appt date/time, parking situation and where to check in, pre-test NPO status  and verified current allergies; name and call back number provided for further questions should they arise  Merle Prescott RN Navigator Cardiac Imaging [facility] [phone] office [phone] cell  Patient aware to avoid caffeine 12 hours prior to her cardiac PET scan. 

## 2023-06-05 ENCOUNTER — Encounter (HOSPITAL_COMMUNITY)
Admission: RE | Admit: 2023-06-05 | Discharge: 2023-06-05 | Disposition: A | Discharge status: Home / Self Care | Payer: 59 | Source: Ambulatory Visit | Attending: Cardiology | Admitting: Cardiology

## 2023-06-05 ENCOUNTER — Ambulatory Visit (HOSPITAL_COMMUNITY): Payer: 59

## 2023-06-05 DIAGNOSIS — R079 Chest pain, unspecified: Secondary | ICD-10-CM | POA: Diagnosis present

## 2023-06-05 MED ORDER — REGADENOSON 0.4 MG/5ML IV SOLN
0.4000 mg | Freq: Once | INTRAVENOUS | Status: AC
  Administered 2023-06-05: 0.4 mg via INTRAVENOUS

## 2023-06-05 MED ORDER — RUBIDIUM RB82 GENERATOR (RUBYFILL): 25.6400 | PACK | Freq: Once | INTRAVENOUS | Status: DC

## 2023-06-05 MED ORDER — RUBIDIUM RB82 GENERATOR (RUBYFILL)
20.0000 | PACK | Freq: Once | INTRAVENOUS | Status: AC
  Administered 2023-06-05: 25.64 via INTRAVENOUS

## 2023-06-05 MED ORDER — REGADENOSON 0.4 MG/5ML IV SOLN
INTRAVENOUS | Status: AC
  Filled 2023-06-05: qty 5

## 2023-06-05 MED ORDER — RUBIDIUM RB82 GENERATOR (RUBYFILL)
20.0000 | PACK | Freq: Once | INTRAVENOUS | Status: AC
  Administered 2023-06-05: 25.58 via INTRAVENOUS

## 2023-06-05 NOTE — Progress Notes (Signed)
Patient presents for a cardiac PET stress test and tolerated procedure without incident. Patient maintained acceptable vital signs throughout the test and was offered caffeine after test.  Patient taken out of department in wheelchair.  Patient asymptomatic.[name] [name]

## 2023-06-06 LAB — NM PET CT CARDIAC PERFUSION MULTI W/ABSOLUTE BLOODFLOW
LV dias vol: 235 mL (ref 46–106)
LV sys vol: 176 mL
MBFR: 1.54
Nuc Rest EF: 25 %
Nuc Stress EF: 28 %
Peak HR: 89 {beats}/min
Rest HR: 78 {beats}/min
Rest MBF: 0.82 ml/g/min
Rest Nuclear Isotope Dose: 25.6 mCi
ST Depression (mm): 0 mm
Stress MBF: 1.26 ml/g/min
Stress Nuclear Isotope Dose: 25.6 mCi
TID: 1

## 2023-06-12 ENCOUNTER — Telehealth: Payer: Self-pay

## 2023-06-12 NOTE — Telephone Encounter (Signed)
-----   Message from Nicolasa Ducking sent at 06/12/2023 12:24 PM EDT ----- PET CT cardiac perfusion study previously ordered by [name] [name] at time of hospitalization in 04/2023.  Study is high risk w evidence of prior area of heart attack and poor blood flow surrounding that area.  Heart squeeze is reduced at 25% at rest (pt known to have reduced heart squeeze).  Significant coronary calcifications noted.  Recommend follow-up with Dr. Allyson Sabal or APP to discuss results further.  She currently does not have follow-up until November - please move that appt up to something within the next 1-2 wks.

## 2023-06-12 NOTE — Telephone Encounter (Signed)
Spoke with patient and she is scheduled with [name] [name] for 9/13 to discuss Pet CT

## 2023-06-13 ENCOUNTER — Ambulatory Visit: Payer: 59 | Admitting: Physician Assistant

## 2023-06-13 ENCOUNTER — Other Ambulatory Visit: Payer: 59

## 2023-06-17 NOTE — Progress Notes (Unsigned)
[facility] OFFICE PROGRESS NOTE  Vladimir Crofts, FNP [address] [place] [address]  DIAGNOSIS: Recurrent non-small cell lung cancer diagnosed and September 2023 and initially diagnosed as stage IA (T1c, N0, M0) non-small cell lung cancer, squamous cell carcinoma presented with left lower lobe lung mass diagnosed in April 2023.   PRIOR THERAPY: 1) Status post curative radiotherapy to the left lower lobe lung mass under the care of Dr. Mitzi Hansen completed on April 06, 2022. 2) status SBRT to 2 left lower lobe nodules under the care of Dr. Mitzi Hansen completed September 26, 2022.  CURRENT THERAPY: Observation.   INTERVAL HISTORY: [name] [name] 72 y.o. female returns to the clinic today for follow-up visit.  The patient was last seen by Dr. Arbutus Ped in February 2024.  She is currently on observation for her history of recurrent lung cancer.  She is status post radiation in June 2023 and then followed by SBRT due to left lower lobe pulmonary nodules to the care of Dr. Mitzi Hansen in December 2023.  She is following with restaging CT scans every 6 months.  The patient has several comorbidities including end-stage renal disease on HD, hypertension, diabetes, combined systolic and diastolic heart failure, and sleep apnea with CPAP intolerance.  She was supposed to follow-up in early August 2024 however, the patient was hospitalized for COPD exacerbation and combined acute on chronic diastolic and systolic heart failure in the setting of ESRD.  She had a CT angiogram performed during her hospitalization on 05/06/2023 which showed no evidence of PE, cardiomegaly and central pulmonary vascular congestion without overt edema, left pleural effusion with compressive left lower lobe atelectasis.  She then had a cardiac PET scan on 06/05/2023 however, she receives different tracer for cardiac exam versus PET scan for lung malignancy.Marland Kitchen  PET scan redemonstrated moderate left pleural effusion with  near complete atelectasis of the left lower lobe which radiology felt could be posttreatment appearance of known malignancy and assessment of residual or recurrent disease was limited on nontailored exam.  They mention that a prevascular lymph node was unchanged compared to prior CT although significantly enlarged compared to 11/09/2022 worrisome for nodal metastatic disease although it could be reactive.  She is feeling***today.  She denies any fever, chills, night sweats, or unexplained weight loss.  She continues to have baseline dyspnea on exertion for which she is on supplemental oxygen.  Denies any chest pain, cough, or hemoptysis.  Denies any nausea, vomiting, diarrhea, or constipation.  Denies any headache or visual changes.  She is here today for evaluation and for more detailed discussion about her current condition and neck steps in her care.  MEDICAL HISTORY: Past Medical History:  Diagnosis Date   Anemia    Arthritis    Asthma    Chronic diastolic (congestive) heart failure (HCC)    Chronic kidney disease    Dialysis T-TH-Sat   Chronic pain syndrome    Class 2 obesity due to excess calories with body mass index (BMI) of 36.0 to 36.9 in adult    COPD (chronic obstructive pulmonary disease) (HCC)    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   Full dentures    GERD (gastroesophageal reflux disease)    Gout    History of hiatal hernia    History of transfusion    Hx MRSA infection    abscess left groin   Hyperlipidemia    Hypertension  Hypothyroid    Intractable nausea and vomiting 02/15/2021   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HCC)    Peripheral vascular disease (HCC)    Sleep apnea    UNABLE TO TOLERATE C PAP   Stress incontinence    TIA (transient ischemic attack)     X2 NO RESIDUAL PROBLEMS   Wears glasses     ALLERGIES:  is allergic to nebivolol, zestril [lisinopril], ace inhibitors, and morphine and codeine.  MEDICATIONS:  Current  Outpatient Medications  Medication Sig Dispense Refill   acetaminophen (TYLENOL) 500 MG tablet Take 1 tablet (500 mg total) by mouth See admin instructions. Take 1,000mg  (2 tablets) by mouth twice a day in addition to 1,000mg  (2 tablets) twice daily as needed for pain.     albuterol (PROVENTIL) (2.5 MG/3ML) 0.083% nebulizer solution Take 3 mLs (2.5 mg total) by nebulization every 6 (six) hours as needed for wheezing or shortness of breath. 90 mL 1   albuterol (VENTOLIN HFA) 108 (90 Base) MCG/ACT inhaler Inhale 2 puffs into the lungs every 6 (six) hours as needed for shortness of breath (COPD). 18 g 1   allopurinol (ZYLOPRIM) 100 MG tablet Take 1 tablet (100 mg total) by mouth daily. 30 tablet 1   budesonide-formoterol (SYMBICORT) 160-4.5 MCG/ACT inhaler Inhale 2 puffs into the lungs in the morning and at bedtime. 10.2 g 12   carboxymethylcellulose (REFRESH PLUS) 0.5 % SOLN Place 1 drop into both eyes 3 (three) times daily as needed (dry/irritated eyes.).     carvedilol (COREG) 6.25 MG tablet Take 1 tablet (6.25 mg total) by mouth 2 (two) times daily with a meal.     clopidogrel (PLAVIX) 75 MG tablet Take 1 tablet (75 mg total) by mouth daily. 30 tablet 3   diclofenac Sodium (VOLTAREN) 1 % GEL Apply 2 g topically 4 (four) times daily.     docusate sodium (COLACE) 100 MG capsule Take 1 capsule (100 mg total) by mouth daily. 100 capsule 0   fluticasone (FLONASE) 50 MCG/ACT nasal spray Place 1 spray into both nostrils daily. 16 g 2   insulin lispro (HUMALOG KWIKPEN) 100 UNIT/ML KwikPen 0-15 Units, Subcutaneous, 3 times daily with meal CBG < 70: Implement Hypoglycemia meausers CBG 70 - 120: 0 units CBG 121 - 150: 2 units CBG 151 - 200: 3 units CBG 201 - 250: 5 units CBG 251 - 300: 8 units CBG 301 - 350: 11 units CBG 351 - 400: 15 units CBG > 400: call MD 15 mL 11   levothyroxine (SYNTHROID) 175 MCG tablet Take 1 tablet (175 mcg total) by mouth daily. 30 tablet 3   lidocaine 4 % Place 2 patches onto the  skin daily. Each lower side of back. 60 patch 1   lidocaine-prilocaine (EMLA) cream Apply 1 Application topically See admin instructions. On dialysis days (Tues,Thurs,Sat)     Multiple Vitamins-Iron (MULTIVITAMINS WITH IRON) TABS tablet Take 1 tablet by mouth in the morning. 30 tablet 1   Nutritional Supplements (FEEDING SUPPLEMENT, NEPRO CARB STEADY,) LIQD Take 237 mLs by mouth 2 (two) times daily between meals.  0   OXYGEN Inhale into the lungs.     pantoprazole (PROTONIX) 40 MG tablet Take 1 tablet (40 mg total) by mouth 2 (two) times daily before a meal. 60 tablet 2   pregabalin (LYRICA) 25 MG capsule Take 25 mg by mouth daily.     sevelamer carbonate (RENVELA) 800 MG tablet Take 2 tablets (1,600 mg total) by mouth 3 (three) times daily with  meals. 180 tablet 2   thiamine (VITAMIN B1) 100 MG tablet Take 1 tablet (100 mg total) by mouth in the morning. 30 tablet 2   umeclidinium bromide (INCRUSE ELLIPTA) 62.5 MCG/ACT AEPB Inhale 1 puff into the lungs daily. 30 each 3   No current facility-administered medications for this visit.    SURGICAL HISTORY:  Past Surgical History:  Procedure Laterality Date   ABDOMINAL HYSTERECTOMY     AV FISTULA PLACEMENT Left 12/01/2021   Procedure: INSERTION OF LEFT ARM ARTERIOVENOUS (AV) GORE-TEX GRAFT;  Surgeon: Nada Libman, MD;  Location: [facility] OR;  Service: Vascular;  Laterality: Left;   BACK SURGERY  2020   BASCILIC VEIN TRANSPOSITION Left 04/05/2020   Procedure: BASILIC VEIN TRANSPOSITION FIRST STAGE;  Surgeon: Chuck Hint, MD;  Location: [facility] OR;  Service: Vascular;  Laterality: Left;   BRONCHIAL BIOPSY  01/23/2022   Procedure: BRONCHIAL BIOPSIES;  Surgeon: Josephine Igo, DO;  Location: [facility] ENDOSCOPY;  Service: Pulmonary;;   BRONCHIAL BIOPSY  08/07/2022   Procedure: BRONCHIAL BIOPSIES;  Surgeon: Josephine Igo, DO;  Location: [facility] ENDOSCOPY;  Service: Pulmonary;;   BRONCHIAL BRUSHINGS  08/07/2022   Procedure: BRONCHIAL BRUSHINGS;  Surgeon:  Josephine Igo, DO;  Location: [facility] ENDOSCOPY;  Service: Pulmonary;;   BRONCHIAL NEEDLE ASPIRATION BIOPSY  01/23/2022   Procedure: BRONCHIAL NEEDLE ASPIRATION BIOPSIES;  Surgeon: Josephine Igo, DO;  Location: [facility] ENDOSCOPY;  Service: Pulmonary;;   BRONCHIAL NEEDLE ASPIRATION BIOPSY  08/07/2022   Procedure: BRONCHIAL NEEDLE ASPIRATION BIOPSIES;  Surgeon: Josephine Igo, DO;  Location: [facility] ENDOSCOPY;  Service: Pulmonary;;   COLON SURGERY  1995   DUE TO POLYP   DILATION AND CURETTAGE OF UTERUS     ENDOBRONCHIAL ULTRASOUND  01/23/2022   Procedure: ENDOBRONCHIAL ULTRASOUND;  Surgeon: Josephine Igo, DO;  Location: [facility] ENDOSCOPY;  Service: Pulmonary;;   ENTEROSCOPY N/A 07/10/2021   Procedure: ENTEROSCOPY;  Surgeon: Kerin Salen, MD;  Location: [facility] ENDOSCOPY;  Service: Gastroenterology;  Laterality: N/A;   ESOPHAGOGASTRODUODENOSCOPY N/A 01/25/2021   Procedure: ESOPHAGOGASTRODUODENOSCOPY (EGD);  Surgeon: Vida Rigger, MD;  Location: [facility] ENDOSCOPY;  Service: Endoscopy;  Laterality: N/A;   FINE NEEDLE ASPIRATION  01/23/2022   Procedure: FINE NEEDLE ASPIRATION (FNA) LINEAR;  Surgeon: Josephine Igo, DO;  Location: [facility] ENDOSCOPY;  Service: Pulmonary;;   FOREARM FRACTURE SURGERY     Left arm   GIVENS CAPSULE STUDY N/A 07/07/2021   Procedure: GIVENS CAPSULE STUDY;  Surgeon: Vida Rigger, MD;  Location: [facility] ENDOSCOPY;  Service: Endoscopy;  Laterality: N/A;   HERNIA REPAIR     w/ mesh   HOT HEMOSTASIS N/A 07/10/2021   Procedure: HOT HEMOSTASIS (ARGON PLASMA COAGULATION/BICAP);  Surgeon: Kerin Salen, MD;  Location: [facility] ENDOSCOPY;  Service: Gastroenterology;  Laterality: N/A;   INCISION AND DRAINAGE ABSCESS Left 02/04/2013   Procedure: INCISION AND DRAINAGE LEFT BUTTOCK ABSCESS; INCISION AND DRAINAGE LEFT BREAST ABSCESS;  Surgeon: Shelly Rubenstein, MD;  Location: [facility] OR;  Service: General;  Laterality: Left;   INCISION AND DRAINAGE ABSCESS N/A 02/12/2013   Procedure: INCISION AND DEBRIDEMENT BUTTOCK WOUND ;  Surgeon:  Wilmon Arms. Corliss Skains, MD;  Location: [facility] OR;  Service: General;  Laterality: N/A;   INCISION AND DRAINAGE ABSCESS N/A 02/14/2013   Procedure: INCISION AND DRAINAGE/DRESSING CHANGE;  Surgeon: Shelly Rubenstein, MD;  Location: [facility] OR;  Service: General;  Laterality: N/A;   INCISION AND DRAINAGE ABSCESS N/A 03/21/2015   Procedure: INCISION AND DRAINAGE PUBIC ABSCESS;  Surgeon: Glenna Fellows, MD;  Location: [facility]  ORS;  Service: General;  Laterality: N/A;   INCISION AND DRAINAGE PERIRECTAL ABSCESS Left 02/10/2013   Procedure: IRRIGATION AND DEBRIDEMENT OF BUTTOCK/PERINEAL ABSCESS;  Surgeon: Wilmon Arms. Corliss Skains, MD;  Location: [facility] OR;  Service: General;  Laterality: Left;   INCISION AND DRAINAGE PERIRECTAL ABSCESS N/A 02/16/2013   Procedure: IRRIGATION AND DEBRIDEMENT PERINEAL ABSCESS;  Surgeon: Liz Malady, MD;  Location: [facility] OR;  Service: General;  Laterality: N/A;   IR FLUORO GUIDE CV LINE RIGHT  11/30/2021   IR US GUIDE VASC ACCESS RIGHT  11/30/2021   IRRIGATION AND DEBRIDEMENT ABSCESS N/A 02/06/2013   Procedure: IRRIGATION AND DEBRIDEMENT BUTTOCK ABSCESS AND DRESSING CHANGE;  Surgeon: Shelly Rubenstein, MD;  Location: [facility] OR;  Service: General;  Laterality: N/A;   IRRIGATION AND DEBRIDEMENT ABSCESS Left 02/08/2013   Procedure: IRRIGATION AND DEBRIDEMENT ABSCESS/DRESSING CHANGE;  Surgeon: Cherylynn Ridges, MD;  Location: [facility] OR;  Service: General;  Laterality: Left;   JOINT REPLACEMENT  2010 / 2012    LAPAROSCOPIC CHOLECYSTECTOMY     LEFT HEART CATHETERIZATION WITH CORONARY ANGIOGRAM N/A 07/02/2014   Procedure: LEFT HEART CATHETERIZATION WITH CORONARY ANGIOGRAM;  Surgeon: Runell Gess, MD;  Location: [facility] CATH LAB;  Service: Cardiovascular;  Laterality: N/A;   LOWER EXTREMITY ANGIOGRAM Right 04/20/2016   Procedure: Lower Extremity Angiogram;  Surgeon: Sherren Kerns, MD;  Location: [facility] INVASIVE CV LAB;  Service: Cardiovascular;  Laterality: Right;   MULTIPLE TOOTH EXTRACTIONS     PERIPHERAL VASCULAR  CATHETERIZATION N/A 04/20/2016   Procedure: Abdominal Aortogram;  Surgeon: Sherren Kerns, MD;  Location: [facility] INVASIVE CV LAB;  Service: Cardiovascular;  Laterality: N/A;   PERIPHERAL VASCULAR CATHETERIZATION Right 04/20/2016   Procedure: Peripheral Vascular Intervention;  Surgeon: Sherren Kerns, MD;  Location: [facility] INVASIVE CV LAB;  Service: Cardiovascular;  Laterality: Right;  popiteal   RIGHT HEART CATH N/A 10/27/2018   Procedure: RIGHT HEART CATH;  Surgeon: Laurey Morale, MD;  Location: [facility] INVASIVE CV LAB;  Service: Cardiovascular;  Laterality: N/A;   RIGHT HEART CATH N/A 03/20/2019   Procedure: RIGHT HEART CATH;  Surgeon: Laurey Morale, MD;  Location: [facility] INVASIVE CV LAB;  Service: Cardiovascular;  Laterality: N/A;   THYROIDECTOMY     TOTAL HIP REVISION Right 08/11/2014   Procedure: RIGHT ACETABULAR REVISION;  Surgeon: Loanne Drilling, MD;  Location: [facility] ORS;  Service: Orthopedics;  Laterality: Right;   VASCULAR SURGERY     VIDEO BRONCHOSCOPY WITH ENDOBRONCHIAL ULTRASOUND Bilateral 08/07/2022   Procedure: VIDEO BRONCHOSCOPY WITH ENDOBRONCHIAL ULTRASOUND;  Surgeon: Josephine Igo, DO;  Location: [facility] ENDOSCOPY;  Service: Pulmonary;  Laterality: Bilateral;   VIDEO BRONCHOSCOPY WITH RADIAL ENDOBRONCHIAL ULTRASOUND  01/23/2022   Procedure: RADIAL ENDOBRONCHIAL ULTRASOUND;  Surgeon: Josephine Igo, DO;  Location: [facility] ENDOSCOPY;  Service: Pulmonary;;    REVIEW OF SYSTEMS:   Review of Systems  Constitutional: Negative for appetite change, chills, fatigue, fever and unexpected weight change.  HENT:   Negative for mouth sores, nosebleeds, sore throat and trouble swallowing.   Eyes: Negative for eye problems and icterus.  Respiratory: Negative for cough, hemoptysis, shortness of breath and wheezing.   Cardiovascular: Negative for chest pain and leg swelling.  Gastrointestinal: Negative for abdominal pain, constipation, diarrhea, nausea and vomiting.  Genitourinary: Negative for bladder  incontinence, difficulty urinating, dysuria, frequency and hematuria.   Musculoskeletal: Negative for back pain, gait problem, neck pain and neck stiffness.  Skin: Negative for itching and rash.  Neurological: Negative for dizziness, extremity weakness, gait problem,  headaches, light-headedness and seizures.  Hematological: Negative for adenopathy. Does not bruise/bleed easily.  Psychiatric/Behavioral: Negative for confusion, depression and sleep disturbance. The patient is not nervous/anxious.     PHYSICAL EXAMINATION:  There were no vitals taken for this visit.  ECOG PERFORMANCE STATUS: {CHL ONC ECOG [id number]  Physical Exam  Constitutional: Oriented to person, place, and time and well-developed, well-nourished, and in no distress. No distress.  HENT:  Head: Normocephalic and atraumatic.  Mouth/Throat: Oropharynx is clear and moist. No oropharyngeal exudate.  Eyes: Conjunctivae are normal. Right eye exhibits no discharge. Left eye exhibits no discharge. No scleral icterus.  Neck: Normal range of motion. Neck supple.  Cardiovascular: Normal rate, regular rhythm, normal heart sounds and intact distal pulses.   Pulmonary/Chest: Effort normal and breath sounds normal. No respiratory distress. No wheezes. No rales.  Abdominal: Soft. Bowel sounds are normal. Exhibits no distension and no mass. There is no tenderness.  Musculoskeletal: Normal range of motion. Exhibits no edema.  Lymphadenopathy:    No cervical adenopathy.  Neurological: Alert and oriented to person, place, and time. Exhibits normal muscle tone. Gait normal. Coordination normal.  Skin: Skin is warm and dry. No rash noted. Not diaphoretic. No erythema. No pallor.  Psychiatric: Mood, memory and judgment normal.  Vitals reviewed.  LABORATORY DATA: Lab Results  Component Value Date   WBC 8.3 05/11/2023   HGB 9.6 (L) 05/11/2023   HCT 32.4 (L) 05/11/2023   MCV 102.5 (H) 05/11/2023   PLT 248 05/11/2023      Chemistry       Component Value Date/Time   NA 130 (L) 05/11/2023 0614   NA 130 (L) 05/11/2023 0614   NA 146 (H) 12/23/2013 1132   K 4.8 05/11/2023 0614   K 4.8 05/11/2023 0614   CL 92 (L) 05/11/2023 0614   CL 91 (L) 05/11/2023 0614   CO2 28 05/11/2023 0614   CO2 26 05/11/2023 0614   BUN 76 (H) 05/11/2023 0614   BUN 76 (H) 05/11/2023 0614   BUN 31 (H) 12/23/2013 1132   CREATININE 6.81 (H) 05/11/2023 0614   CREATININE 6.71 (H) 05/11/2023 0614   CREATININE 4.65 (HH) 11/09/2022 1000   CREATININE 1.32 (H) 06/29/2014 1100      Component Value Date/Time   CALCIUM 9.4 05/11/2023 0614   CALCIUM 9.3 05/11/2023 0614   CALCIUM 9.1 09/07/2021 1036   ALKPHOS 126 05/06/2023 2111   AST 23 05/06/2023 2111   AST 14 (L) 11/09/2022 1000   ALT 14 05/06/2023 2111   ALT 9 11/09/2022 1000   BILITOT 0.6 05/06/2023 2111   BILITOT 0.3 11/09/2022 1000       RADIOGRAPHIC STUDIES:  NM PET CT CARDIAC PERFUSION MULTI W/ABSOLUTE BLOODFLOW  Result Date: 06/06/2023   Findings are consistent with a small area of infarction with peri-infarct ischemia in the inferior wall. This study is high risk. Consider cardiac catheterization given reduced myocardial blood flow reserve, severe coronary calcifications, reduced EF with no augmentation with stress, and perfusion defect.   LV perfusion is abnormal. Defect 1: There is a small defect with mild reduction in uptake present in the apical to mid inferior location(s) that is partially reversible. There is abnormal wall motion in the defect area. Consistent with infarction and peri-infarct ischemia.   Rest left ventricular function is abnormal. Rest global function is severely reduced. Rest EF: 25%. Stress left ventricular function is abnormal. Stress global function is severely reduced. Stress EF: 28%. Ejection fraction may be inaccurately gated in the  setting frequent ectopy. End diastolic cavity size is severely enlarged. End systolic cavity size is severely enlarged. No evidence  of transient ischemic dilation (TID) noted.   Myocardial blood flow was computed to be 0.9ml/g/min at rest and 1.60ml/g/min at stress. Global myocardial blood flow reserve was 1.54 and was abnormal. Challenging to accurately interpret MBFR in the setting of ESRD.   Coronary calcium was present on the attenuation correction CT images. Severe coronary calcifications were present. Coronary calcifications were present in the left anterior descending artery and left circumflex artery distribution(s).   Electronically signed by: Parke Poisson, MD CLINICAL DATA:  This over-read does not include interpretation of cardiac or coronary anatomy or pathology. The Cardiac PET CT interpretation by the cardiologist is attached. Patient with known left lower lobe lung cancer. * Tracking Code: BO * COMPARISON:  CT chest angiogram, 05/06/2023, CT chest, 11/09/2022 FINDINGS: Cardiovascular: Aortic atherosclerosis. Cardiomegaly. Left coronary artery calcifications. No pericardial effusion. Limited Mediastinum/Nodes: Prevascular lymph node unchanged compared to recent prior CT, although significantly enlarged compared to prior examination dated 11/09/2022 measuring 1.9 x 1.3 cm (series 3, image 9) trachea and esophagus demonstrate no significant findings. Limited Lungs/Pleura: Moderate left pleural effusion and near complete atelectasis or consolidation of the left lower lobe, similar to recent prior examination although markedly increased compared to more remote examination dated 11/09/2022. Trace right pleural effusion. Unchanged, partially imaged fat herniation or pleural lipoma of the anterior right hemithorax, unchanged compared to prior examination dated 11/09/2022 Upper Abdomen: No acute abnormality. Musculoskeletal: No acute osseous findings. Subacute to chronic, partially callused bilateral anterior rib fractures. IMPRESSION: 1. Moderate left pleural effusion and near complete atelectasis or consolidation of the left lower  lobe, similar to recent prior examination although markedly increased compared to more remote examination dated 11/09/2022. This may reflect post treatment appearance of known malignancy; assessment for residual or recurrent disease very limited on this non tailored examination. Attention on oncologic follow-up. Note that metabolic activity of lung malignancy is not meaningfully assessed by rubidium PET-CT. 2. Prevascular lymph node unchanged compared to recent prior CT, although significantly enlarged compared to prior examination dated 11/09/2022 this is worrisome for nodal metastatic disease although may be reactive. 3. Cardiomegaly and coronary artery disease. Aortic Atherosclerosis (ICD10-I70.0). Electronically Signed   By: Jearld Lesch M.D.   On: 06/05/2023 15:12    ASSESSMENT/PLAN:  This is a very pleasant 72 year old African-American female with recurrent non-small cell lung cancer.  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  She presented with a left lower lobe lung mass.  She is status post curative radiation under the care of Dr. Mitzi Hansen was completed on April 06, 2022.  She then had a restaging CT scan that showed 2 separate solid basilar left lower lobe pulmonary nodules the largest measuring 1.6 cm compatible with new pulmonary metastasis.  She had a PET scan that showed hypermetabolic activity in the 2 pulmonary nodules in the left lower lobe and she underwent bronchoscopy of the left lower lobe nodule and was consistent with squamous carcinoma.  She then underwent SBRT to the 2 left lower lobe pulmonary nodules under the care of Dr. Mitzi Hansen which was completed in December 2023.  Recently, the patient was hospitalized and had a CT angio that mentioned increase effusion and enlarged ***compared to February 2024.  The patient was seen with Dr. Arbutus Ped today.  Dr. Arbutus Ped recommended a nuclear PET scan.  The  patient  recently had a cardiac PET scan but this does not have the same tracer.  Dr. Arbutus Ped also recommends diagnostic thoracentesis.  We will see the patient back for follow-up visit in 2 weeks for evaluation to review the results of the studies and discuss the next steps in her care.  She will continue to follow-up with nephrology for her end-stage renal disease on hemodialysis and nephrology.  She will continue to follow with cardiology.  The patient was advised to call immediately if she has any concerning symptoms in the interval. The patient voices understanding of current disease status and treatment options and is in agreement with the current care plan. All questions were answered. The patient knows to call the clinic with any problems, questions or concerns. We can certainly see the patient much sooner if necessary   No orders of the defined types were placed in this encounter.    I spent {CHL ONC TIME VISIT - NWGNF:[id number]} counseling the patient face to face. The total time spent in the appointment was {CHL ONC TIME VISIT - IONGE:[id number]}.  [name] Angelo L Reggie Bise, PA-C 06/17/23

## 2023-06-19 ENCOUNTER — Inpatient Hospital Stay (HOSPITAL_BASED_OUTPATIENT_CLINIC_OR_DEPARTMENT_OTHER): Payer: No Typology Code available for payment source | Admitting: Physician Assistant

## 2023-06-19 ENCOUNTER — Inpatient Hospital Stay: Payer: No Typology Code available for payment source | Attending: Physician Assistant

## 2023-06-19 VITALS — BP 128/67 | HR 95 | Temp 98.4°F | Resp 17 | Ht 63.0 in | Wt 203.0 lb

## 2023-06-19 DIAGNOSIS — Z992 Dependence on renal dialysis: Secondary | ICD-10-CM | POA: Insufficient documentation

## 2023-06-19 DIAGNOSIS — N186 End stage renal disease: Secondary | ICD-10-CM | POA: Diagnosis not present

## 2023-06-19 DIAGNOSIS — G4730 Sleep apnea, unspecified: Secondary | ICD-10-CM | POA: Insufficient documentation

## 2023-06-19 DIAGNOSIS — E785 Hyperlipidemia, unspecified: Secondary | ICD-10-CM | POA: Insufficient documentation

## 2023-06-19 DIAGNOSIS — K219 Gastro-esophageal reflux disease without esophagitis: Secondary | ICD-10-CM | POA: Insufficient documentation

## 2023-06-19 DIAGNOSIS — I5042 Chronic combined systolic (congestive) and diastolic (congestive) heart failure: Secondary | ICD-10-CM | POA: Insufficient documentation

## 2023-06-19 DIAGNOSIS — Z79899 Other long term (current) drug therapy: Secondary | ICD-10-CM | POA: Insufficient documentation

## 2023-06-19 DIAGNOSIS — Z7902 Long term (current) use of antithrombotics/antiplatelets: Secondary | ICD-10-CM | POA: Diagnosis not present

## 2023-06-19 DIAGNOSIS — I132 Hypertensive heart and chronic kidney disease with heart failure and with stage 5 chronic kidney disease, or end stage renal disease: Secondary | ICD-10-CM | POA: Diagnosis not present

## 2023-06-19 DIAGNOSIS — E1122 Type 2 diabetes mellitus with diabetic chronic kidney disease: Secondary | ICD-10-CM | POA: Diagnosis not present

## 2023-06-19 DIAGNOSIS — Z8673 Personal history of transient ischemic attack (TIA), and cerebral infarction without residual deficits: Secondary | ICD-10-CM | POA: Insufficient documentation

## 2023-06-19 DIAGNOSIS — E1151 Type 2 diabetes mellitus with diabetic peripheral angiopathy without gangrene: Secondary | ICD-10-CM | POA: Insufficient documentation

## 2023-06-19 DIAGNOSIS — Z8614 Personal history of Methicillin resistant Staphylococcus aureus infection: Secondary | ICD-10-CM | POA: Insufficient documentation

## 2023-06-19 DIAGNOSIS — G894 Chronic pain syndrome: Secondary | ICD-10-CM | POA: Insufficient documentation

## 2023-06-19 DIAGNOSIS — C3432 Malignant neoplasm of lower lobe, left bronchus or lung: Secondary | ICD-10-CM | POA: Diagnosis present

## 2023-06-19 DIAGNOSIS — Z923 Personal history of irradiation: Secondary | ICD-10-CM | POA: Diagnosis not present

## 2023-06-19 DIAGNOSIS — J90 Pleural effusion, not elsewhere classified: Secondary | ICD-10-CM | POA: Insufficient documentation

## 2023-06-19 DIAGNOSIS — Z791 Long term (current) use of non-steroidal anti-inflammatories (NSAID): Secondary | ICD-10-CM | POA: Diagnosis not present

## 2023-06-19 DIAGNOSIS — E039 Hypothyroidism, unspecified: Secondary | ICD-10-CM | POA: Insufficient documentation

## 2023-06-19 DIAGNOSIS — M109 Gout, unspecified: Secondary | ICD-10-CM | POA: Diagnosis not present

## 2023-06-19 DIAGNOSIS — Z79890 Hormone replacement therapy (postmenopausal): Secondary | ICD-10-CM | POA: Diagnosis not present

## 2023-06-19 DIAGNOSIS — C3490 Malignant neoplasm of unspecified part of unspecified bronchus or lung: Secondary | ICD-10-CM

## 2023-06-19 DIAGNOSIS — Z794 Long term (current) use of insulin: Secondary | ICD-10-CM | POA: Diagnosis not present

## 2023-06-19 DIAGNOSIS — J4489 Other specified chronic obstructive pulmonary disease: Secondary | ICD-10-CM | POA: Insufficient documentation

## 2023-06-19 LAB — CBC WITH DIFFERENTIAL (CANCER CENTER ONLY)
Abs Immature Granulocytes: 0.04 10*3/uL (ref 0.00–0.07)
Basophils Absolute: 0 10*3/uL (ref 0.0–0.1)
Basophils Relative: 0 %
Eosinophils Absolute: 0.1 10*3/uL (ref 0.0–0.5)
Eosinophils Relative: 1 %
HCT: 38.8 % (ref 36.0–46.0)
Hemoglobin: 11.5 g/dL — ABNORMAL LOW (ref 12.0–15.0)
Immature Granulocytes: 1 %
Lymphocytes Relative: 17 %
Lymphs Abs: 1.3 10*3/uL (ref 0.7–4.0)
MCH: 31.8 pg (ref 26.0–34.0)
MCHC: 29.6 g/dL — ABNORMAL LOW (ref 30.0–36.0)
MCV: 107.2 fL — ABNORMAL HIGH (ref 80.0–100.0)
Monocytes Absolute: 0.5 10*3/uL (ref 0.1–1.0)
Monocytes Relative: 6 %
Neutro Abs: 5.6 10*3/uL (ref 1.7–7.7)
Neutrophils Relative %: 75 %
Platelet Count: 210 10*3/uL (ref 150–400)
RBC: 3.62 MIL/uL — ABNORMAL LOW (ref 3.87–5.11)
RDW: 16 % — ABNORMAL HIGH (ref 11.5–15.5)
WBC Count: 7.5 10*3/uL (ref 4.0–10.5)
nRBC: 0 % (ref 0.0–0.2)

## 2023-06-19 LAB — CMP (CANCER CENTER ONLY)
ALT: 10 U/L (ref 0–44)
AST: 17 U/L (ref 15–41)
Albumin: 3.9 g/dL (ref 3.5–5.0)
Alkaline Phosphatase: 95 U/L (ref 38–126)
Anion gap: 10 (ref 5–15)
BUN: 43 mg/dL — ABNORMAL HIGH (ref 8–23)
CO2: 35 mmol/L — ABNORMAL HIGH (ref 22–32)
Calcium: 9.7 mg/dL (ref 8.9–10.3)
Chloride: 95 mmol/L — ABNORMAL LOW (ref 98–111)
Creatinine: 5.92 mg/dL — ABNORMAL HIGH (ref 0.44–1.00)
GFR, Estimated: 7 mL/min — ABNORMAL LOW (ref >60)
Glucose, Bld: 167 mg/dL — ABNORMAL HIGH (ref 70–99)
Potassium: 4 mmol/L (ref 3.5–5.1)
Sodium: 140 mmol/L (ref 135–145)
Total Bilirubin: 0.4 mg/dL (ref 0.3–1.2)
Total Protein: 7.1 g/dL (ref 6.5–8.1)

## 2023-06-21 ENCOUNTER — Ambulatory Visit: Payer: No Typology Code available for payment source | Admitting: Physician Assistant

## 2023-06-21 NOTE — Progress Notes (Deleted)
Cardiology Office Note:  .   Date:  06/21/2023  ID:  [name] [name], DOB [birth year], MRN [id number] PCP: Vladimir Crofts, FNP  [facility] Providers Cardiologist:  Nanetta Batty, MD Advanced Heart Failure:  Marca Ancona, MD { Click to update primary MD,subspecialty MD or APP then REFRESH:1}   History of Present Illness: .   [name] [name] is a 72 y.o. female with PMH of former tobacco use, COPD, lung cancer  s/p curative radiation 2023, hypertension, hyperlipidemia, DM2, TIA, PAD followed by Dr. Darrick Penna, ESRD on HD since 9/23, hypothyroidism, LBBB, morbid obesity and chronic systolic and diastolic heart failure.   ROS: ***  Studies Reviewed: .        *** Risk Assessment/Calculations:   {Does this patient have ATRIAL FIBRILLATION?:[id number]} No BP recorded.  {Refresh Note OR Click here to enter BP  :1}***       Physical Exam:   VS:  There were no vitals taken for this visit.   Wt Readings from Last 3 Encounters:  06/19/23 203 lb (92.1 kg)  05/14/23 218 lb 0.6 oz (98.9 kg)  04/24/23 224 lb (101.6 kg)    GEN: Well nourished, well developed in no acute distress NECK: No JVD; No carotid bruits CARDIAC: ***RRR, no murmurs, rubs, gallops RESPIRATORY:  Clear to auscultation without rales, wheezing or rhonchi  ABDOMEN: Soft, non-tender, non-distended EXTREMITIES:  No edema; No deformity   ASSESSMENT AND PLAN: .   ***    {Are you ordering a CV Procedure (e.g. stress test, cath, DCCV, TEE, etc)?   Press F2        :[id number]}  Dispo: ***  Signed, Azalee Course, PA

## 2023-06-26 NOTE — H&P (View-Only) (Signed)
Cardiology Clinic Note   Patient Name: [name] [name] Date of Encounter: 07/01/2023  Primary Care Provider:  Vladimir Crofts, FNP Primary Cardiologist:  Nanetta Batty, MD  Patient Profile     [name] [name] 72 year old female presents the clinic today for follow-up evaluation of her chest discomfort and chronic combined systolic and diastolic CHF.  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   Full dentures    GERD (gastroesophageal reflux disease)    Gout    History of hiatal hernia    History of transfusion    Hx MRSA infection    abscess left groin   Hyperlipidemia    Hypertension    Hypothyroid    Intractable nausea and vomiting 02/15/2021   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HCC)    Peripheral vascular disease (HCC)    Sleep apnea    UNABLE TO TOLERATE C PAP   Stress incontinence    TIA (transient ischemic attack)     X2 NO RESIDUAL PROBLEMS   Wears glasses    Past Surgical History:  Procedure Laterality Date   ABDOMINAL HYSTERECTOMY     AV FISTULA PLACEMENT Left 12/01/2021   Procedure: INSERTION OF LEFT ARM ARTERIOVENOUS (AV) GORE-TEX GRAFT;  Surgeon: Nada Libman, MD;  Location: [facility] OR;  Service: Vascular;  Laterality: Left;   BACK SURGERY  2020   BASCILIC VEIN TRANSPOSITION Left 04/05/2020   Procedure: BASILIC VEIN TRANSPOSITION FIRST STAGE;  Surgeon: Chuck Hint, MD;  Location: [facility] OR;  Service: Vascular;  Laterality: Left;   BRONCHIAL BIOPSY  01/23/2022   Procedure: BRONCHIAL BIOPSIES;  Surgeon: Josephine Igo, DO;  Location: [facility] ENDOSCOPY;   Service: Pulmonary;;   BRONCHIAL BIOPSY  08/07/2022   Procedure: BRONCHIAL BIOPSIES;  Surgeon: Josephine Igo, DO;  Location: [facility] ENDOSCOPY;  Service: Pulmonary;;   BRONCHIAL BRUSHINGS  08/07/2022   Procedure: BRONCHIAL BRUSHINGS;  Surgeon: Josephine Igo, DO;  [facility]: [facility];  Service: Pulmonary;;   BRONCHIAL NEEDLE ASPIRATION BIOPSY  01/23/2022   Procedure: BRONCHIAL NEEDLE ASPIRATION BIOPSIES;  Surgeon: Josephine Igo, DO;  [facility]: [facility]OS[facility];  Service: Pulmonary;;   BRONCHIAL NEEDLE ASPIRATION BIOPSY  08/07/2022   Procedure: BRONCHIAL NEEDLE ASPIRATION BIOPSIES;  Surgeon: Josephine Igo, DO;  [facility]: [facility];  Service: Pulmonary;;   COLON SURGERY  1995   DUE TO POLYP   DILATION AND CURETTAGE OF UTERUS     ENDOBRONCHIAL ULTRASOUND  01/23/2022   Procedure: ENDOBRONCHIAL ULTRASOUND;  Surgeon: Josephine Igo, DO;  Location: [facility];  Service: Pulmonary;;   ENTEROSCOPY N/A 07/10/2021   Procedure: ENTEROSCOPY;  Surgeon: Kerin Salen, MD;  Location[facility] ENDOSCOPY;  Service: Gastroenterology;  Laterality: N/A;   ESOPHAGOGASTRODUODENOSCOPY N/A 01/25/2021   Procedure: ESOPHAGOGASTRODUODENOSCOPY (EGD);  Surgeon: Vida Rigger, MD;  Location: [facility] ENDOSCOPY;  Service: Endoscopy;  Laterality: N/A;   FINE NEEDLE ASPIRATION  01/23/2022   Procedure: FINE NEEDLE ASPIRATION (FNA) LINEAR;  Surgeon: Josephine Igo, DO;  [facility]: [facility]OS[facility];  Service:  Pulmonary;;   FOREARM FRACTURE SURGERY     Left arm   GIVENS CAPSULE STUDY N/A 07/07/2021   Procedure: GIVENS CAPSULE STUDY;  Surgeon: Vida Rigger, MD;  Location: [facility] ENDOSCOPY;  Service: Endoscopy;  Laterality: N/A;   HERNIA REPAIR     w/ mesh   HOT HEMOSTASIS N/A 07/10/2021   Procedure: HOT HEMOSTASIS (ARGON PLASMA COAGULATION/BICAP);  Surgeon: Kerin Salen, MD;  Location: [facility] ENDOSCOPY;  Service: Gastroenterology;  Laterality: N/A;   INCISION AND DRAINAGE ABSCESS Left 02/04/2013   Procedure: INCISION AND DRAINAGE LEFT BUTTOCK  ABSCESS; INCISION AND DRAINAGE LEFT BREAST ABSCESS;  Surgeon: Shelly Rubenstein, MD;  Location: [facility] OR;  Service: General;  Laterality: Left;   INCISION AND DRAINAGE ABSCESS N/A 02/12/2013   Procedure: INCISION AND DEBRIDEMENT BUTTOCK WOUND ;  Surgeon: Wilmon Arms. Corliss Skains, MD;  Location: [facility] OR;  Service: General;  Laterality: N/A;   INCISION AND DRAINAGE ABSCESS N/A 02/14/2013   Procedure: INCISION AND DRAINAGE/DRESSING CHANGE;  Surgeon: Shelly Rubenstein, MD;  Location: [facility] OR;  Service: General;  Laterality: N/A;   INCISION AND DRAINAGE ABSCESS N/A 03/21/2015   Procedure: INCISION AND DRAINAGE PUBIC ABSCESS;  Surgeon: Glenna Fellows, MD;  Location: [facility] ORS;  Service: General;  Laterality: N/A;   INCISION AND DRAINAGE PERIRECTAL ABSCESS Left 02/10/2013   Procedure: IRRIGATION AND DEBRIDEMENT OF BUTTOCK/PERINEAL ABSCESS;  Surgeon: Wilmon Arms. Corliss Skains, MD;  Location: [facility] OR;  Service: General;  Laterality: Left;   INCISION AND DRAINAGE PERIRECTAL ABSCESS N/A 02/16/2013   Procedure: IRRIGATION AND DEBRIDEMENT PERINEAL ABSCESS;  Surgeon: Liz Malady, MD;  Location: [facility];  Service: General;  Laterality: N/A;   IR FLUORO GUIDE CV LINE RIGHT  11/30/2021   IR US GUIDE VASC ACCESS RIGHT  11/30/2021   IRRIGATION AND DEBRIDEMENT ABSCESS N/A 02/06/2013   Procedure: IRRIGATION AND DEBRIDEMENT BUTTOCK ABSCESS AND DRESSING CHANGE;  Surgeon: Shelly Rubenstein, MD;  [facility]: [facility];  Service: General;  Laterality: N/A;   IRRIGATION AND DEBRIDEMENT ABSCESS Left 02/08/2013   Procedure: IRRIGATION AND DEBRIDEMENT ABSCESS/DRESSING CHANGE;  Surgeon: Cherylynn Ridges, MD;  Location: [facility] OR;  Service: General;  Laterality: Left;   JOINT REPLACEMENT  2010 / 2012    LAPAROSCOPIC CHOLECYSTECTOMY     LEFT HEART CATHETERIZATION WITH CORONARY ANGIOGRAM N/A 07/02/2014   Procedure: LEFT HEART CATHETERIZATION WITH CORONARY ANGIOGRAM;  Surgeon: Runell Gess, MD;  Location: [facility] CATH LAB;  Service: Cardiovascular;  Laterality: N/A;   LOWER  EXTREMITY ANGIOGRAM Right 04/20/2016   Procedure: Lower Extremity Angiogram;  Surgeon: Sherren Kerns, MD;  Location[facility] INVASIVE CV LAB;  Service: Cardiovascular;  Laterality: Right;   MULTIPLE TOOTH EXTRACTIONS     PERIPHERAL VASCULAR CATHETERIZATION N/A 04/20/2016   Procedure: Abdominal Aortogram;  Surgeon: Sherren Kerns, MD;  Location: [facility] INVASIVE CV LAB;  Service: Cardiovascular;  Laterality: N/A;   PERIPHERAL VASCULAR CATHETERIZATION Right 04/20/2016   Procedure: Peripheral Vascular Intervention;  Surgeon: Sherren Kerns, MD;  Location: [facility] INVASIVE CV LAB;  Service: Cardiovascular;  Laterality: Right;  popiteal   RIGHT HEART CATH N/A 10/27/2018   Procedure: RIGHT HEART CATH;  Surgeon: Laurey Morale, MD;  Location: [facility] INVASIVE CV LAB;  Service: Cardiovascular;  Laterality: N/A;   RIGHT HEART CATH N/A 03/20/2019   Procedure: RIGHT HEART CATH;  Surgeon: Laurey Morale, MD;  Location: [facility] INVASIVE CV LAB;  Service: Cardiovascular;  Laterality: N/A;   THYROIDECTOMY     TOTAL HIP REVISION Right 08/11/2014   Procedure: RIGHT ACETABULAR  REVISION;  Surgeon: Loanne Drilling, MD;  Location: [facility] ORS;  Service: Orthopedics;  Laterality: Right;   VASCULAR SURGERY     VIDEO BRONCHOSCOPY WITH ENDOBRONCHIAL ULTRASOUND Bilateral 08/07/2022   Procedure: VIDEO BRONCHOSCOPY WITH ENDOBRONCHIAL ULTRASOUND;  Surgeon: Josephine Igo, DO;  Location: [facility] ENDOSCOPY;  Service: Pulmonary;  Laterality: Bilateral;   VIDEO BRONCHOSCOPY WITH RADIAL ENDOBRONCHIAL ULTRASOUND  01/23/2022   Procedure: RADIAL ENDOBRONCHIAL ULTRASOUND;  Surgeon: Josephine Igo, DO;  Location: [facility] ENDOSCOPY;  Service: Pulmonary;;    Allergies  Allergies  Allergen Reactions   Nebivolol Swelling    Chest pain (reaction to Bystolic)   Zestril [Lisinopril] Swelling and Other (See Comments)   Ace Inhibitors Swelling and Other (See Comments)    Tongue swell   Morphine And Codeine Itching    History of Present Illness     [name] [name] is a PMH of diabetes, HTN, HLD, ESRD, COPD, peripheral vascular disease, prior TIA, small cell lung cancer, and chest pain.  She underwent cardiac catheterization in 2015 which showed no significant coronary disease.  Her echocardiogram 2/23 showed an EF of 40-45%, mild LVH, G1 DD, and severe left atrial enlargement.  Mild aortic stenosis with a mean gradient of 8 mmHg was also noted.  She was admitted with chest pain on 04/13/2023 and discharged on 04/17/2023.  Her repeat echocardiogram showed an LVEF of 35-40% and no new wall motion abnormalities.  Quantifying her pain was very difficult.  Outpatient PET scan was recommended.  She was continued on HD for her ESRD.  Her blood pressure was noted to be labile and her medications were unable to be titrated.  Her fluid volume is managed by HD.  No heart failure medications were able to be titrated at that time.  She presents to the clinic today for follow-up evaluation and states she notices episodes of chest pressure with not having her oxygen on and when she becomes angry.  She denies chest pain today.  She continues with 3 L of oxygen nasal cannula.  She reports compliance with hemodialysis Tuesday Thursday Saturday.  We reviewed her cardiac PET/CT.  She expressed understanding.  I reviewed case with DOD.  We will proceed to [facility].  I will order CBC and BMP today.  Will plan follow-up in 1 month.  Today she denies increased shortness of breath, lower extremity edema, fatigue, palpitations, melena, hematuria, hemoptysis, diaphoresis, weakness, presyncope, syncope, orthopnea, and PND.     Home Medications    Prior to Admission medications   Medication Sig Start Date End Date Taking? Authorizing Provider  acetaminophen (TYLENOL) 500 MG tablet Take 1 tablet (500 mg total) by mouth See admin instructions. Take 1,000mg  (2 tablets) by mouth twice a day in addition to 1,000mg  (2 tablets) twice daily as needed for pain. 05/14/23   Ghimire, Werner Lean, MD   albuterol (PROVENTIL) (2.5 MG/3ML) 0.083% nebulizer solution Take 3 mLs (2.5 mg total) by nebulization every 6 (six) hours as needed for wheezing or shortness of breath. 05/14/23   Ghimire, Werner Lean, MD  albuterol (VENTOLIN HFA) 108 (90 Base) MCG/ACT inhaler Inhale 2 puffs into the lungs every 6 (six) hours as needed for shortness of breath (COPD). 05/14/23   Ghimire, Werner Lean, MD  allopurinol (ZYLOPRIM) 100 MG tablet Take 1 tablet (100 mg total) by mouth daily. 05/14/23   Ghimire, Werner Lean, MD  budesonide-formoterol (SYMBICORT) 160-4.5 MCG/ACT inhaler Inhale 2 puffs into the lungs in the morning and at bedtime. 05/14/23   Ghimire,  Werner Lean, MD  carboxymethylcellulose (REFRESH PLUS) 0.5 % SOLN Place 1 drop into both eyes 3 (three) times daily as needed (dry/irritated eyes.).    [provider]  carvedilol (COREG) 6.25 MG tablet Take 1 tablet (6.25 mg total) by mouth 2 (two) times daily with a meal. 04/17/23   Joseph Art, DO  clopidogrel (PLAVIX) 75 MG tablet Take 1 tablet (75 mg total) by mouth daily. 05/14/23   Ghimire, Werner Lean, MD  diclofenac Sodium (VOLTAREN) 1 % GEL Apply 2 g topically 4 (four) times daily. 04/17/23   Joseph Art, DO  docusate sodium (COLACE) 100 MG capsule Take 1 capsule (100 mg total) by mouth daily. 05/14/23   Ghimire, Werner Lean, MD  fluticasone (FLONASE) 50 MCG/ACT nasal spray Place 1 spray into both nostrils daily. 05/14/23   Ghimire, Werner Lean, MD  insulin lispro (HUMALOG KWIKPEN) 100 UNIT/ML KwikPen 0-15 Units, Subcutaneous, 3 times daily with meal CBG < 70: Implement Hypoglycemia meausers CBG 70 - 120: 0 units CBG 121 - 150: 2 units CBG 151 - 200: 3 units CBG 201 - 250: 5 units CBG 251 - 300: 8 units CBG 301 - 350: 11 units CBG 351 - 400: 15 units CBG > 400: call MD 05/14/23   Maretta Bees, MD  levothyroxine (SYNTHROID) 175 MCG tablet Take 1 tablet (175 mcg total) by mouth daily. 05/14/23   Ghimire, Werner Lean, MD  lidocaine 4 % Place 2 patches onto the skin  daily. Each lower side of back. 05/14/23   Ghimire, Werner Lean, MD  lidocaine-prilocaine (EMLA) cream Apply 1 Application topically See admin instructions. On dialysis days (Tues,Thurs,Sat) 02/13/22   [provider]  Multiple Vitamins-Iron (MULTIVITAMINS WITH IRON) TABS tablet Take 1 tablet by mouth in the morning. 05/14/23   Ghimire, Werner Lean, MD  Nutritional Supplements (FEEDING SUPPLEMENT, NEPRO CARB STEADY,) LIQD Take 237 mLs by mouth 2 (two) times daily between meals. 04/17/23   Joseph Art, DO  OXYGEN Inhale into the lungs.    [provider]  pantoprazole (PROTONIX) 40 MG tablet Take 1 tablet (40 mg total) by mouth 2 (two) times daily before a meal. 05/14/23   Ghimire, Werner Lean, MD  pregabalin (LYRICA) 25 MG capsule Take 25 mg by mouth daily. 05/29/23   [provider]  sevelamer carbonate (RENVELA) 800 MG tablet Take 2 tablets (1,600 mg total) by mouth 3 (three) times daily with meals. 05/14/23   Ghimire, Werner Lean, MD  thiamine (VITAMIN B1) 100 MG tablet Take 1 tablet (100 mg total) by mouth in the morning. 05/14/23   Ghimire, Werner Lean, MD  umeclidinium bromide (INCRUSE ELLIPTA) 62.5 MCG/ACT AEPB Inhale 1 puff into the lungs daily. 05/14/23   Ghimire, Werner Lean, MD    Family History    Family History  Problem Relation Age of Onset   Diabetes Brother    Cardiomyopathy Mother    Heart disease Mother    Cancer Father    She indicated that her mother is deceased. She indicated that her father is deceased. She indicated that her brother is alive. She indicated that her maternal grandmother is deceased. She indicated that her maternal grandfather is deceased. She indicated that her paternal grandmother is deceased. She indicated that her paternal grandfather is deceased.  Social History    Social History   Socioeconomic History   Marital status: Legally Separated    Spouse name: Not on file   Number of children: 1   Years of  education: college   Highest education  level: Not on file  Occupational History    Comment: Disabled  Tobacco Use   Smoking status: Former    Current packs/day: 0.00    Average packs/day: 0.3 packs/day for 40.0 years (10.0 ttl pk-yrs)    Types: Cigarettes    Start date: 10/1981    Quit date: 10/2021    Years since quitting: 1.7    Passive exposure: Past   Smokeless tobacco: Former  Building services engineer status: Never Used  Substance and Sexual Activity   Alcohol use: Yes    Alcohol/week: 1.0 standard drink of alcohol    Types: 1 Standard drinks or equivalent per week    Comment: OCC   Drug use: No   Sexual activity: Not Currently    Birth control/protection: Surgical    Comment: Hysterectomy  Other Topics Concern   Not on file  Social History Narrative   ** Merged History Encounter **       Patient is separated  and lives at home with her friend. Patient is disabled. Education one year of college Right handed Caffeine two cups daily.     Social Determinants of Health   Financial Resource Strain: Not on file  Food Insecurity: No Food Insecurity (04/13/2023)   Hunger Vital Sign    Worried About Running Out of Food in the Last Year: Never true    Ran Out of Food in the Last Year: Never true  Transportation Needs: No Transportation Needs (04/13/2023)   PRAPARE - Administrator, Civil Service (Medical): No    Lack of Transportation (Non-Medical): No  Physical Activity: Not on file  Stress: Not on file  Social Connections: Not on file  Intimate Partner Violence: Not At Risk (04/13/2023)   Humiliation, Afraid, Rape, and Kick questionnaire    Fear of Current or Ex-Partner: No    Emotionally Abused: No    Physically Abused: No    Sexually Abused: No     Review of Systems    General:  No chills, fever, night sweats or weight changes.  Cardiovascular:  No chest pain, dyspnea on exertion, edema, orthopnea, palpitations, paroxysmal nocturnal dyspnea. Dermatological: No rash, lesions/masses Respiratory:  No cough, dyspnea Urologic: No hematuria, dysuria Abdominal:   No nausea, vomiting, diarrhea, bright red blood per rectum, melena, or hematemesis Neurologic:  No visual changes, wkns, changes in mental status. All other systems reviewed and are otherwise negative except as noted above.  Physical Exam    VS:  BP 90/60   Pulse 94   Ht 5\' 3"  (1.6 m)   Wt 203 lb (92.1 kg)   SpO2 95%   BMI 35.96 kg/m  , BMI Body mass index is 35.96 kg/m. GEN: Well nourished, well developed, in no acute distress. HEENT: normal. Neck: Supple, no JVD, carotid bruits, or masses. Cardiac: RRR, no murmurs, rubs, or gallops. No clubbing, cyanosis, edema.  Radials/DP/PT 2+ and equal bilaterally.  Respiratory:  Respirations regular and unlabored, clear to auscultation bilaterally. GI: Soft, nontender, nondistended, BS + x 4. MS: no deformity or atrophy. Skin: warm and dry, no rash. Neuro:  Strength and sensation are intact. Psych: Normal affect.  Accessory Clinical Findings    Recent Labs: 05/06/2023: B Natriuretic Peptide 617.7 05/11/2023: Magnesium 2.0 06/19/2023: ALT 10; BUN 43; Creatinine 5.92; Hemoglobin 11.5; Platelet Count 210; Potassium 4.0; Sodium 140   Recent Lipid Panel    Component Value Date/Time   CHOL 160 04/13/2023 1858  CHOL 188 12/23/2013 1132   TRIG 271 (H) 04/13/2023 1858   HDL 28 (L) 04/13/2023 1858   HDL 48 12/23/2013 1132   CHOLHDL 5.7 04/13/2023 1858   VLDL 54 (H) 04/13/2023 1858   LDLCALC 78 04/13/2023 1858   LDLCALC 119 (H) 12/23/2013 1132         ECG personally reviewed by me today-none today.     Cardiac PET CT-06/05/2023   Findings are consistent with a small area of infarction with peri-infarct ischemia in the inferior wall. This study is high risk. Consider cardiac catheterization given reduced myocardial blood flow reserve, severe coronary calcifications, reduced EF with no augmentation with stress, and perfusion defect.   LV perfusion is abnormal. Defect 1: There  is a small defect with mild reduction in uptake present in the apical to mid inferior location(s) that is partially reversible. There is abnormal wall motion in the defect area. Consistent with infarction and peri-infarct ischemia.   Rest left ventricular function is abnormal. Rest global function is severely reduced. Rest EF: 25%. Stress left ventricular function is abnormal. Stress global function is severely reduced. Stress EF: 28%. Ejection fraction may be inaccurately gated in the setting frequent ectopy. End diastolic cavity size is severely enlarged. End systolic cavity size is severely enlarged. No evidence of transient ischemic dilation (TID) noted.   Myocardial blood flow was computed to be 0.63ml/g/min at rest and 1.71ml/g/min at stress. Global myocardial blood flow reserve was 1.54 and was abnormal. Challenging to accurately interpret MBFR in the setting of ESRD.   Coronary calcium was present on the attenuation correction CT images. Severe coronary calcifications were present. Coronary calcifications were present in the left anterior descending artery and left circumflex artery distribution(s).   Electronically signed by: Parke Poisson, MD   CLINICAL DATA:  This over-read does not include interpretation of cardiac or coronary anatomy or pathology. The Cardiac PET CT interpretation by the cardiologist is attached. Patient with known left lower lobe lung cancer. * Tracking Code: BO *   COMPARISON:  CT chest angiogram, 05/06/2023, CT chest, 11/09/2022   FINDINGS: Cardiovascular: Aortic atherosclerosis. Cardiomegaly. Left coronary artery calcifications. No pericardial effusion.   Limited Mediastinum/Nodes: Prevascular lymph node unchanged compared to recent prior CT, although significantly enlarged compared to prior examination dated 11/09/2022 measuring 1.9 x 1.3 cm (series 3, image 9) trachea and esophagus demonstrate no significant findings.   Limited Lungs/Pleura: Moderate left  pleural effusion and near complete atelectasis or consolidation of the left lower lobe, similar to recent prior examination although markedly increased compared to more remote examination dated 11/09/2022. Trace right pleural effusion. Unchanged, partially imaged fat herniation or pleural lipoma of the anterior right hemithorax, unchanged compared to prior examination dated 11/09/2022   Upper Abdomen: No acute abnormality.   Musculoskeletal: No acute osseous findings. Subacute to chronic, partially callused bilateral anterior rib fractures.   IMPRESSION: 1. Moderate left pleural effusion and near complete atelectasis or consolidation of the left lower lobe, similar to recent prior examination although markedly increased compared to more remote examination dated 11/09/2022. This may reflect post treatment appearance of known malignancy; assessment for residual or recurrent disease very limited on this non tailored examination. Attention on oncologic follow-up. Note that metabolic activity of lung malignancy is not meaningfully assessed by rubidium PET-CT. 2. Prevascular lymph node unchanged compared to recent prior CT, although significantly enlarged compared to prior examination dated 11/09/2022 this is worrisome for nodal metastatic disease although may be reactive. 3. Cardiomegaly and coronary artery  disease.   Aortic Atherosclerosis (ICD10-I70.0).     Electronically Signed   By: Jearld Lesch M.D.   On: 06/05/2023 15:12       Assessment & Plan   1.  Chest discomfort-no chest pain today.  Cardiac PET CT on 06/05/2023.  She was noted to have a small area of infarct with peri-infarct ischemia in the inferior wall.  Her study was noted to be high risk.  Cardiac catheterization was recommended.  Benefits and risks of cardiac catheterization were reviewed.  Case was discussed with DOD. Proceed to cardiac catheterization CBC,BMP  Informed Consent   Shared Decision  Making/Informed Consent The risks [stroke (1 in 1000), death (1 in 1000), kidney failure [usually temporary] (1 in 500), bleeding (1 in 200), allergic reaction [possibly serious] (1 in 200)], benefits (diagnostic support and management of coronary artery disease) and alternatives of a cardiac catheterization were discussed in detail with Ms. Mcateer and she is willing to proceed.      Chronic combined systolic and diastolic CHF-weight today 203 lbs.  Fluid volume managed by HD.  EF noted to be around 25% on cardiac PET/CT.  Unable to titrate GDMT due to BP. Continue carvedilol Heart healthy low-sodium diet  Hyperlipidemia-LDL 78 on 04/13/23. Heart healthy low-sodium high-fiber diet Continue current medical therapy  Essential hypertension-BP today 90/60. Maintain blood pressure log  Disposition: Follow-up with Dr. Allyson Sabal or me after cardiac catheterization.   Thomasene Ripple. Colton Engdahl NP-C     07/01/2023, 2:32 PM [facility] [address] Office [phone] Fax [phone]    I spent 14 minutes examining this patient, reviewing medications, and using patient centered shared decision making involving her cardiac care.  Prior to her visit I spent greater than 20 minutes reviewing her past medical history,  medications, and prior cardiac tests.

## 2023-06-26 NOTE — Progress Notes (Unsigned)
Cardiology Clinic Note   Patient Name: [name] [name] Date of Encounter: 07/01/2023  Primary Care Provider:  Vladimir Crofts, FNP Primary Cardiologist:  Nanetta Batty, MD  Patient Profile     [name] [name] 72 year old female presents the clinic today for follow-up evaluation of her chest discomfort and chronic combined systolic and diastolic CHF.  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   Full dentures    GERD (gastroesophageal reflux disease)    Gout    History of hiatal hernia    History of transfusion    Hx MRSA infection    abscess left groin   Hyperlipidemia    Hypertension    Hypothyroid    Intractable nausea and vomiting 02/15/2021   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HCC)    Peripheral vascular disease (HCC)    Sleep apnea    UNABLE TO TOLERATE C PAP   Stress incontinence    TIA (transient ischemic attack)     X2 NO RESIDUAL PROBLEMS   Wears glasses    Past Surgical History:  Procedure Laterality Date   ABDOMINAL HYSTERECTOMY     AV FISTULA PLACEMENT Left 12/01/2021   Procedure: INSERTION OF LEFT ARM ARTERIOVENOUS (AV) GORE-TEX GRAFT;  Surgeon: Nada Libman, MD;  Location: [facility] OR;  Service: Vascular;  Laterality: Left;   BACK SURGERY  2020   BASCILIC VEIN TRANSPOSITION Left 04/05/2020   Procedure: BASILIC VEIN TRANSPOSITION FIRST STAGE;  Surgeon: Chuck Hint, MD;  Location: [facility] OR;  Service: Vascular;  Laterality: Left;   BRONCHIAL BIOPSY  01/23/2022   Procedure: BRONCHIAL BIOPSIES;  Surgeon: Josephine Igo, DO;  Location: [facility] ENDOSCOPY;   Service: Pulmonary;;   BRONCHIAL BIOPSY  08/07/2022   Procedure: BRONCHIAL BIOPSIES;  Surgeon: Josephine Igo, DO;  Location: [facility] ENDOSCOPY;  Service: Pulmonary;;   BRONCHIAL BRUSHINGS  08/07/2022   Procedure: BRONCHIAL BRUSHINGS;  Surgeon: Josephine Igo, DO;  [facility]: [facility];  Service: Pulmonary;;   BRONCHIAL NEEDLE ASPIRATION BIOPSY  01/23/2022   Procedure: BRONCHIAL NEEDLE ASPIRATION BIOPSIES;  Surgeon: Josephine Igo, DO;  [facility]: [facility]OS[facility];  Service: Pulmonary;;   BRONCHIAL NEEDLE ASPIRATION BIOPSY  08/07/2022   Procedure: BRONCHIAL NEEDLE ASPIRATION BIOPSIES;  Surgeon: Josephine Igo, DO;  Location: [facility];  Service: Pulmonary;;   COLON SURGERY  1995   DUE TO POLYP   DILATION AND CURETTAGE OF UTERUS     ENDOBRONCHIAL ULTRASOUND  01/23/2022   Procedure: ENDOBRONCHIAL ULTRASOUND;  Surgeon: Josephine Igo, DO;  Location: [facility];  Service: Pulmonary;;   ENTEROSCOPY N/A 07/10/2021   Procedure: ENTEROSCOPY;  Surgeon: Kerin Salen, MD;  Location[facility] ENDOSCOPY;  Service: Gastroenterology;  Laterality: N/A;   ESOPHAGOGASTRODUODENOSCOPY N/A 01/25/2021   Procedure: ESOPHAGOGASTRODUODENOSCOPY (EGD);  Surgeon: Vida Rigger, MD;  Location: [facility] ENDOSCOPY;  Service: Endoscopy;  Laterality: N/A;   FINE NEEDLE ASPIRATION  01/23/2022   Procedure: FINE NEEDLE ASPIRATION (FNA) LINEAR;  Surgeon: Josephine Igo, DO;  Location: [facility]OS[facility];  Service:  Pulmonary;;   FOREARM FRACTURE SURGERY     Left arm   GIVENS CAPSULE STUDY N/A 07/07/2021   Procedure: GIVENS CAPSULE STUDY;  Surgeon: Vida Rigger, MD;  Location: [facility] ENDOSCOPY;  Service: Endoscopy;  Laterality: N/A;   HERNIA REPAIR     w/ mesh   HOT HEMOSTASIS N/A 07/10/2021   Procedure: HOT HEMOSTASIS (ARGON PLASMA COAGULATION/BICAP);  Surgeon: Kerin Salen, MD;  Location: [facility] ENDOSCOPY;  Service: Gastroenterology;  Laterality: N/A;   INCISION AND DRAINAGE ABSCESS Left 02/04/2013   Procedure: INCISION AND DRAINAGE LEFT BUTTOCK  ABSCESS; INCISION AND DRAINAGE LEFT BREAST ABSCESS;  Surgeon: Shelly Rubenstein, MD;  Location: [facility] OR;  Service: General;  Laterality: Left;   INCISION AND DRAINAGE ABSCESS N/A 02/12/2013   Procedure: INCISION AND DEBRIDEMENT BUTTOCK WOUND ;  Surgeon: Wilmon Arms. Corliss Skains, MD;  Location: [facility] OR;  Service: General;  Laterality: N/A;   INCISION AND DRAINAGE ABSCESS N/A 02/14/2013   Procedure: INCISION AND DRAINAGE/DRESSING CHANGE;  Surgeon: Shelly Rubenstein, MD;  Location: [facility] OR;  Service: General;  Laterality: N/A;   INCISION AND DRAINAGE ABSCESS N/A 03/21/2015   Procedure: INCISION AND DRAINAGE PUBIC ABSCESS;  Surgeon: Glenna Fellows, MD;  Location: [facility] ORS;  Service: General;  Laterality: N/A;   INCISION AND DRAINAGE PERIRECTAL ABSCESS Left 02/10/2013   Procedure: IRRIGATION AND DEBRIDEMENT OF BUTTOCK/PERINEAL ABSCESS;  Surgeon: Wilmon Arms. Corliss Skains, MD;  Location: [facility] OR;  Service: General;  Laterality: Left;   INCISION AND DRAINAGE PERIRECTAL ABSCESS N/A 02/16/2013   Procedure: IRRIGATION AND DEBRIDEMENT PERINEAL ABSCESS;  Surgeon: Liz Malady, MD;  Location: [facility];  Service: General;  Laterality: N/A;   IR FLUORO GUIDE CV LINE RIGHT  11/30/2021   IR US GUIDE VASC ACCESS RIGHT  11/30/2021   IRRIGATION AND DEBRIDEMENT ABSCESS N/A 02/06/2013   Procedure: IRRIGATION AND DEBRIDEMENT BUTTOCK ABSCESS AND DRESSING CHANGE;  Surgeon: Shelly Rubenstein, MD;  [facility]: [facility] OR;  Service: General;  Laterality: N/A;   IRRIGATION AND DEBRIDEMENT ABSCESS Left 02/08/2013   Procedure: IRRIGATION AND DEBRIDEMENT ABSCESS/DRESSING CHANGE;  Surgeon: Cherylynn Ridges, MD;  Location: [facility] OR;  Service: General;  Laterality: Left;   JOINT REPLACEMENT  2010 / 2012    LAPAROSCOPIC CHOLECYSTECTOMY     LEFT HEART CATHETERIZATION WITH CORONARY ANGIOGRAM N/A 07/02/2014   Procedure: LEFT HEART CATHETERIZATION WITH CORONARY ANGIOGRAM;  Surgeon: Runell Gess, MD;  Location: [facility] CATH LAB;  Service: Cardiovascular;  Laterality: N/A;   LOWER  EXTREMITY ANGIOGRAM Right 04/20/2016   Procedure: Lower Extremity Angiogram;  Surgeon: Sherren Kerns, MD;  Location[facility] INVASIVE CV LAB;  Service: Cardiovascular;  Laterality: Right;   MULTIPLE TOOTH EXTRACTIONS     PERIPHERAL VASCULAR CATHETERIZATION N/A 04/20/2016   Procedure: Abdominal Aortogram;  Surgeon: Sherren Kerns, MD;  Location: [facility] INVASIVE CV LAB;  Service: Cardiovascular;  Laterality: N/A;   PERIPHERAL VASCULAR CATHETERIZATION Right 04/20/2016   Procedure: Peripheral Vascular Intervention;  Surgeon: Sherren Kerns, MD;  Location: [facility] INVASIVE CV LAB;  Service: Cardiovascular;  Laterality: Right;  popiteal   RIGHT HEART CATH N/A 10/27/2018   Procedure: RIGHT HEART CATH;  Surgeon: Laurey Morale, MD;  Location: [facility] INVASIVE CV LAB;  Service: Cardiovascular;  Laterality: N/A;   RIGHT HEART CATH N/A 03/20/2019   Procedure: RIGHT HEART CATH;  Surgeon: Laurey Morale, MD;  Location: [facility] INVASIVE CV LAB;  Service: Cardiovascular;  Laterality: N/A;   THYROIDECTOMY     TOTAL HIP REVISION Right 08/11/2014   Procedure: RIGHT ACETABULAR  REVISION;  Surgeon: Loanne Drilling, MD;  Location: [facility] ORS;  Service: Orthopedics;  Laterality: Right;   VASCULAR SURGERY     VIDEO BRONCHOSCOPY WITH ENDOBRONCHIAL ULTRASOUND Bilateral 08/07/2022   Procedure: VIDEO BRONCHOSCOPY WITH ENDOBRONCHIAL ULTRASOUND;  Surgeon: Josephine Igo, DO;  Location: [facility] ENDOSCOPY;  Service: Pulmonary;  Laterality: Bilateral;   VIDEO BRONCHOSCOPY WITH RADIAL ENDOBRONCHIAL ULTRASOUND  01/23/2022   Procedure: RADIAL ENDOBRONCHIAL ULTRASOUND;  Surgeon: Josephine Igo, DO;  Location: [facility] ENDOSCOPY;  Service: Pulmonary;;    Allergies  Allergies  Allergen Reactions   Nebivolol Swelling    Chest pain (reaction to Bystolic)   Zestril [Lisinopril] Swelling and Other (See Comments)   Ace Inhibitors Swelling and Other (See Comments)    Tongue swell   Morphine And Codeine Itching    History of Present Illness     KENDRAH KESSINGER is a PMH of diabetes, HTN, HLD, ESRD, COPD, peripheral vascular disease, prior TIA, small cell lung cancer, and chest pain.  She underwent cardiac catheterization in 2015 which showed no significant coronary disease.  Her echocardiogram 2/23 showed an EF of 40-45%, mild LVH, G1 DD, and severe left atrial enlargement.  Mild aortic stenosis with a mean gradient of 8 mmHg was also noted.  She was admitted with chest pain on 04/13/2023 and discharged on 04/17/2023.  Her repeat echocardiogram showed an LVEF of 35-40% and no new wall motion abnormalities.  Quantifying her pain was very difficult.  Outpatient PET scan was recommended.  She was continued on HD for her ESRD.  Her blood pressure was noted to be labile and her medications were unable to be titrated.  Her fluid volume is managed by HD.  No heart failure medications were able to be titrated at that time.  She presents to the clinic today for follow-up evaluation and states she notices episodes of chest pressure with not having her oxygen on and when she becomes angry.  She denies chest pain today.  She continues with 3 L of oxygen nasal cannula.  She reports compliance with hemodialysis Tuesday Thursday Saturday.  We reviewed her cardiac PET/CT.  She expressed understanding.  I reviewed case with DOD.  We will proceed to [facility].  I will order CBC and BMP today.  Will plan follow-up in 1 month.  Today she denies increased shortness of breath, lower extremity edema, fatigue, palpitations, melena, hematuria, hemoptysis, diaphoresis, weakness, presyncope, syncope, orthopnea, and PND.     Home Medications    Prior to Admission medications   Medication Sig Start Date End Date Taking? Authorizing Provider  acetaminophen (TYLENOL) 500 MG tablet Take 1 tablet (500 mg total) by mouth See admin instructions. Take 1,000mg  (2 tablets) by mouth twice a day in addition to 1,000mg  (2 tablets) twice daily as needed for pain. 05/14/23   Ghimire, Werner Lean, MD   albuterol (PROVENTIL) (2.5 MG/3ML) 0.083% nebulizer solution Take 3 mLs (2.5 mg total) by nebulization every 6 (six) hours as needed for wheezing or shortness of breath. 05/14/23   Ghimire, Werner Lean, MD  albuterol (VENTOLIN HFA) 108 (90 Base) MCG/ACT inhaler Inhale 2 puffs into the lungs every 6 (six) hours as needed for shortness of breath (COPD). 05/14/23   Ghimire, Werner Lean, MD  allopurinol (ZYLOPRIM) 100 MG tablet Take 1 tablet (100 mg total) by mouth daily. 05/14/23   Ghimire, Werner Lean, MD  budesonide-formoterol (SYMBICORT) 160-4.5 MCG/ACT inhaler Inhale 2 puffs into the lungs in the morning and at bedtime. 05/14/23   Ghimire,  Werner Lean, MD  carboxymethylcellulose (REFRESH PLUS) 0.5 % SOLN Place 1 drop into both eyes 3 (three) times daily as needed (dry/irritated eyes.).    [provider]  carvedilol (COREG) 6.25 MG tablet Take 1 tablet (6.25 mg total) by mouth 2 (two) times daily with a meal. 04/17/23   Joseph Art, DO  clopidogrel (PLAVIX) 75 MG tablet Take 1 tablet (75 mg total) by mouth daily. 05/14/23   Ghimire, Werner Lean, MD  diclofenac Sodium (VOLTAREN) 1 % GEL Apply 2 g topically 4 (four) times daily. 04/17/23   Joseph Art, DO  docusate sodium (COLACE) 100 MG capsule Take 1 capsule (100 mg total) by mouth daily. 05/14/23   Ghimire, Werner Lean, MD  fluticasone (FLONASE) 50 MCG/ACT nasal spray Place 1 spray into both nostrils daily. 05/14/23   Ghimire, Werner Lean, MD  insulin lispro (HUMALOG KWIKPEN) 100 UNIT/ML KwikPen 0-15 Units, Subcutaneous, 3 times daily with meal CBG < 70: Implement Hypoglycemia meausers CBG 70 - 120: 0 units CBG 121 - 150: 2 units CBG 151 - 200: 3 units CBG 201 - 250: 5 units CBG 251 - 300: 8 units CBG 301 - 350: 11 units CBG 351 - 400: 15 units CBG > 400: call MD 05/14/23   Maretta Bees, MD  levothyroxine (SYNTHROID) 175 MCG tablet Take 1 tablet (175 mcg total) by mouth daily. 05/14/23   Ghimire, Werner Lean, MD  lidocaine 4 % Place 2 patches onto the skin  daily. Each lower side of back. 05/14/23   Ghimire, Werner Lean, MD  lidocaine-prilocaine (EMLA) cream Apply 1 Application topically See admin instructions. On dialysis days (Tues,Thurs,Sat) 02/13/22   [provider]  Multiple Vitamins-Iron (MULTIVITAMINS WITH IRON) TABS tablet Take 1 tablet by mouth in the morning. 05/14/23   Ghimire, Werner Lean, MD  Nutritional Supplements (FEEDING SUPPLEMENT, NEPRO CARB STEADY,) LIQD Take 237 mLs by mouth 2 (two) times daily between meals. 04/17/23   Joseph Art, DO  OXYGEN Inhale into the lungs.    [provider]  pantoprazole (PROTONIX) 40 MG tablet Take 1 tablet (40 mg total) by mouth 2 (two) times daily before a meal. 05/14/23   Ghimire, Werner Lean, MD  pregabalin (LYRICA) 25 MG capsule Take 25 mg by mouth daily. 05/29/23   [provider]  sevelamer carbonate (RENVELA) 800 MG tablet Take 2 tablets (1,600 mg total) by mouth 3 (three) times daily with meals. 05/14/23   Ghimire, Werner Lean, MD  thiamine (VITAMIN B1) 100 MG tablet Take 1 tablet (100 mg total) by mouth in the morning. 05/14/23   Ghimire, Werner Lean, MD  umeclidinium bromide (INCRUSE ELLIPTA) 62.5 MCG/ACT AEPB Inhale 1 puff into the lungs daily. 05/14/23   Ghimire, Werner Lean, MD    Family History    Family History  Problem Relation Age of Onset   Diabetes Brother    Cardiomyopathy Mother    Heart disease Mother    Cancer Father    She indicated that her mother is deceased. She indicated that her father is deceased. She indicated that her brother is alive. She indicated that her maternal grandmother is deceased. She indicated that her maternal grandfather is deceased. She indicated that her paternal grandmother is deceased. She indicated that her paternal grandfather is deceased.  Social History    Social History   Socioeconomic History   Marital status: Legally Separated    Spouse name: Not on file   Number of children: 1   Years of  education: college   Highest education  level: Not on file  Occupational History    Comment: Disabled  Tobacco Use   Smoking status: Former    Current packs/day: 0.00    Average packs/day: 0.3 packs/day for 40.0 years (10.0 ttl pk-yrs)    Types: Cigarettes    Start date: 10/1981    Quit date: 10/2021    Years since quitting: 1.7    Passive exposure: Past   Smokeless tobacco: Former  Building services engineer status: Never Used  Substance and Sexual Activity   Alcohol use: Yes    Alcohol/week: 1.0 standard drink of alcohol    Types: 1 Standard drinks or equivalent per week    Comment: OCC   Drug use: No   Sexual activity: Not Currently    Birth control/protection: Surgical    Comment: Hysterectomy  Other Topics Concern   Not on file  Social History Narrative   ** Merged History Encounter **       Patient is separated  and lives at home with her friend. Patient is disabled. Education one year of college Right handed Caffeine two cups daily.     Social Determinants of Health   Financial Resource Strain: Not on file  Food Insecurity: No Food Insecurity (04/13/2023)   Hunger Vital Sign    Worried About Running Out of Food in the Last Year: Never true    Ran Out of Food in the Last Year: Never true  Transportation Needs: No Transportation Needs (04/13/2023)   PRAPARE - Administrator, Civil Service (Medical): No    Lack of Transportation (Non-Medical): No  Physical Activity: Not on file  Stress: Not on file  Social Connections: Not on file  Intimate Partner Violence: Not At Risk (04/13/2023)   Humiliation, Afraid, Rape, and Kick questionnaire    Fear of Current or Ex-Partner: No    Emotionally Abused: No    Physically Abused: No    Sexually Abused: No     Review of Systems    General:  No chills, fever, night sweats or weight changes.  Cardiovascular:  No chest pain, dyspnea on exertion, edema, orthopnea, palpitations, paroxysmal nocturnal dyspnea. Dermatological: No rash, lesions/masses Respiratory:  No cough, dyspnea Urologic: No hematuria, dysuria Abdominal:   No nausea, vomiting, diarrhea, bright red blood per rectum, melena, or hematemesis Neurologic:  No visual changes, wkns, changes in mental status. All other systems reviewed and are otherwise negative except as noted above.  Physical Exam    VS:  BP 90/60   Pulse 94   Ht 5\' 3"  (1.6 m)   Wt 203 lb (92.1 kg)   SpO2 95%   BMI 35.96 kg/m  , BMI Body mass index is 35.96 kg/m. GEN: Well nourished, well developed, in no acute distress. HEENT: normal. Neck: Supple, no JVD, carotid bruits, or masses. Cardiac: RRR, no murmurs, rubs, or gallops. No clubbing, cyanosis, edema.  Radials/DP/PT 2+ and equal bilaterally.  Respiratory:  Respirations regular and unlabored, clear to auscultation bilaterally. GI: Soft, nontender, nondistended, BS + x 4. MS: no deformity or atrophy. Skin: warm and dry, no rash. Neuro:  Strength and sensation are intact. Psych: Normal affect.  Accessory Clinical Findings    Recent Labs: 05/06/2023: B Natriuretic Peptide 617.7 05/11/2023: Magnesium 2.0 06/19/2023: ALT 10; BUN 43; Creatinine 5.92; Hemoglobin 11.5; Platelet Count 210; Potassium 4.0; Sodium 140   Recent Lipid Panel    Component Value Date/Time   CHOL 160 04/13/2023 1858  CHOL 188 12/23/2013 1132   TRIG 271 (H) 04/13/2023 1858   HDL 28 (L) 04/13/2023 1858   HDL 48 12/23/2013 1132   CHOLHDL 5.7 04/13/2023 1858   VLDL 54 (H) 04/13/2023 1858   LDLCALC 78 04/13/2023 1858   LDLCALC 119 (H) 12/23/2013 1132         ECG personally reviewed by me today-none today.     Cardiac PET CT-06/05/2023   Findings are consistent with a small area of infarction with peri-infarct ischemia in the inferior wall. This study is high risk. Consider cardiac catheterization given reduced myocardial blood flow reserve, severe coronary calcifications, reduced EF with no augmentation with stress, and perfusion defect.   LV perfusion is abnormal. Defect 1: There  is a small defect with mild reduction in uptake present in the apical to mid inferior location(s) that is partially reversible. There is abnormal wall motion in the defect area. Consistent with infarction and peri-infarct ischemia.   Rest left ventricular function is abnormal. Rest global function is severely reduced. Rest EF: 25%. Stress left ventricular function is abnormal. Stress global function is severely reduced. Stress EF: 28%. Ejection fraction may be inaccurately gated in the setting frequent ectopy. End diastolic cavity size is severely enlarged. End systolic cavity size is severely enlarged. No evidence of transient ischemic dilation (TID) noted.   Myocardial blood flow was computed to be 0.63ml/g/min at rest and 1.71ml/g/min at stress. Global myocardial blood flow reserve was 1.54 and was abnormal. Challenging to accurately interpret MBFR in the setting of ESRD.   Coronary calcium was present on the attenuation correction CT images. Severe coronary calcifications were present. Coronary calcifications were present in the left anterior descending artery and left circumflex artery distribution(s).   Electronically signed by: Parke Poisson, MD   CLINICAL DATA:  This over-read does not include interpretation of cardiac or coronary anatomy or pathology. The Cardiac PET CT interpretation by the cardiologist is attached. Patient with known left lower lobe lung cancer. * Tracking Code: BO *   COMPARISON:  CT chest angiogram, 05/06/2023, CT chest, 11/09/2022   FINDINGS: Cardiovascular: Aortic atherosclerosis. Cardiomegaly. Left coronary artery calcifications. No pericardial effusion.   Limited Mediastinum/Nodes: Prevascular lymph node unchanged compared to recent prior CT, although significantly enlarged compared to prior examination dated 11/09/2022 measuring 1.9 x 1.3 cm (series 3, image 9) trachea and esophagus demonstrate no significant findings.   Limited Lungs/Pleura: Moderate left  pleural effusion and near complete atelectasis or consolidation of the left lower lobe, similar to recent prior examination although markedly increased compared to more remote examination dated 11/09/2022. Trace right pleural effusion. Unchanged, partially imaged fat herniation or pleural lipoma of the anterior right hemithorax, unchanged compared to prior examination dated 11/09/2022   Upper Abdomen: No acute abnormality.   Musculoskeletal: No acute osseous findings. Subacute to chronic, partially callused bilateral anterior rib fractures.   IMPRESSION: 1. Moderate left pleural effusion and near complete atelectasis or consolidation of the left lower lobe, similar to recent prior examination although markedly increased compared to more remote examination dated 11/09/2022. This may reflect post treatment appearance of known malignancy; assessment for residual or recurrent disease very limited on this non tailored examination. Attention on oncologic follow-up. Note that metabolic activity of lung malignancy is not meaningfully assessed by rubidium PET-CT. 2. Prevascular lymph node unchanged compared to recent prior CT, although significantly enlarged compared to prior examination dated 11/09/2022 this is worrisome for nodal metastatic disease although may be reactive. 3. Cardiomegaly and coronary artery  disease.   Aortic Atherosclerosis (ICD10-I70.0).     Electronically Signed   By: Jearld Lesch M.D.   On: 06/05/2023 15:12       Assessment & Plan   1.  Chest discomfort-no chest pain today.  Cardiac PET CT on 06/05/2023.  She was noted to have a small area of infarct with peri-infarct ischemia in the inferior wall.  Her study was noted to be high risk.  Cardiac catheterization was recommended.  Benefits and risks of cardiac catheterization were reviewed.  Case was discussed with DOD. Proceed to cardiac catheterization CBC,BMP  Informed Consent   Shared Decision  Making/Informed Consent The risks [stroke (1 in 1000), death (1 in 1000), kidney failure [usually temporary] (1 in 500), bleeding (1 in 200), allergic reaction [possibly serious] (1 in 200)], benefits (diagnostic support and management of coronary artery disease) and alternatives of a cardiac catheterization were discussed in detail with Ms. Mcateer and she is willing to proceed.      Chronic combined systolic and diastolic CHF-weight today 203 lbs.  Fluid volume managed by HD.  EF noted to be around 25% on cardiac PET/CT.  Unable to titrate GDMT due to BP. Continue carvedilol Heart healthy low-sodium diet  Hyperlipidemia-LDL 78 on 04/13/23. Heart healthy low-sodium high-fiber diet Continue current medical therapy  Essential hypertension-BP today 90/60. Maintain blood pressure log  Disposition: Follow-up with Dr. Allyson Sabal or me after cardiac catheterization.   Thomasene Ripple. Colton Engdahl NP-C     07/01/2023, 2:32 PM [facility] [address] Office [phone] Fax [phone]    I spent 14 minutes examining this patient, reviewing medications, and using patient centered shared decision making involving her cardiac care.  Prior to her visit I spent greater than 20 minutes reviewing her past medical history,  medications, and prior cardiac tests.

## 2023-07-01 ENCOUNTER — Encounter (HOSPITAL_COMMUNITY): Payer: Self-pay

## 2023-07-01 ENCOUNTER — Ambulatory Visit: Payer: No Typology Code available for payment source | Attending: Physician Assistant | Admitting: General Practice

## 2023-07-01 ENCOUNTER — Encounter: Payer: Self-pay | Admitting: General Practice

## 2023-07-01 VITALS — BP 90/60 | HR 94 | Ht 63.0 in | Wt 203.0 lb

## 2023-07-01 DIAGNOSIS — I10 Essential (primary) hypertension: Secondary | ICD-10-CM

## 2023-07-01 DIAGNOSIS — R079 Chest pain, unspecified: Secondary | ICD-10-CM

## 2023-07-01 DIAGNOSIS — I5042 Chronic combined systolic (congestive) and diastolic (congestive) heart failure: Secondary | ICD-10-CM

## 2023-07-01 NOTE — Patient Instructions (Addendum)
Medication Instructions:  HOL YOUR HUMALOG THE MORNING OF YOUR CATH 07-05-2023 *If you need a refill on your cardiac medications before your next appointment, please call your pharmacy*  Lab Work: CBC AND BMET TODAY If you have labs (blood work) drawn today and your tests are completely normal, you will receive your results only by: MyChart Message (if you have MyChart) OR A paper copy in the mail If you have any lab test that is abnormal or we need to change your treatment, we will call you to review the results.  Testing/Procedures: Your physician has requested that you have a cardiac catheterization. Cardiac catheterization is used to diagnose and/or treat various heart conditions. Doctors may recommend this procedure for a number of different reasons. The most common reason is to evaluate chest pain. Chest pain can be a symptom of coronary artery disease (CAD), and cardiac catheterization can show whether plaque is narrowing or blocking your heart's arteries. This procedure is also used to evaluate the valves, as well as measure the blood flow and oxygen levels in different parts of your heart. For further information please visit [email or web address] Please follow instruction sheet, as given.  Follow-Up: At [facility], you and your health needs are our priority.  As part of our continuing mission to provide you with exceptional heart care, we have created designated Provider Care Teams.  These Care Teams include your primary Cardiologist (physician) and Advanced Practice Providers (APPs -  Physician Assistants and Nurse Practitioners) who all work together to provide you with the care you need, when you need it.  We recommend signing up for the patient portal called "MyChart".  Sign up information is provided on this After Visit Summary.  MyChart is used to connect with patients for Virtual Visits (Telemedicine).  Patients are able to view lab/test results, encounter notes, upcoming  appointments, etc.  Non-urgent messages can be sent to your provider as well.   To learn more about what you can do with MyChart, go to [email or web address].    Your next appointment:   1 month(s)  Provider:   Nanetta Batty, MD  or Edd Fabian, FNP               Cardiac/Peripheral Catheterization   You are scheduled for a Cardiac Catheterization on Friday, September 27 with Dr. Nicki Guadalajara.  1. Please arrive at the [facility] (Main Entrance A) at [facility]: [address] [place], [place] [address] at 8:30 AM (This time is 2 hour(s) before your procedure to ensure your preparation). Free valet parking service is available. You will check in at ADMITTING. The support person will be asked to wait in the waiting room.  It is OK to have someone drop you off and come back when you are ready to be discharged.       Special note: Every effort is made to have your procedure done on time. Please understand that emergencies sometimes delay scheduled procedures. 2. Diet: Do not eat solid foods after midnight.  You may have clear liquids until 5 AM the day of the procedure. 3. Labs: You will need to have blood drawn on Monday, September 23 at [facility] [address], [place]  Open: 8am - 5pm (Lunch 12:30 - 1:30)   Phone: [phone]. You do not need to be fasting.  4. Medication instructions in preparation for your procedure:  Contrast Allergy: No HOLD HYUMALOG THE MORNING OF YOUR CATH On the morning of your procedure, take Plavix/Clopidogrel  and any morning medicines NOT listed above.  You may use sips of water. 5. Plan to go home the same day, you will only stay overnight if medically necessary. 6. You MUST have a responsible adult to drive you home. 7. An adult MUST be with you the first 24 hours after you arrive home. 8. Bring a current list of your medications, and the last time and date medication taken. 9. Bring ID and current insurance cards. 10.Please  wear clothes that are easy to get on and off and wear slip-on shoes.  Thank you for allowing [name] to care for you!   -- [facility]

## 2023-07-02 LAB — BASIC METABOLIC PANEL
BUN/Creatinine Ratio: 11 — ABNORMAL LOW (ref 12–28)
BUN: 95 mg/dL (ref 8–27)
CO2: 20 mmol/L (ref 20–29)
Calcium: 8.8 mg/dL (ref 8.7–10.3)
Chloride: 93 mmol/L — ABNORMAL LOW (ref 96–106)
Creatinine, Ser: 8.31 mg/dL — ABNORMAL HIGH (ref 0.57–1.00)
Glucose: 213 mg/dL — ABNORMAL HIGH (ref 70–99)
Potassium: 5.4 mmol/L — ABNORMAL HIGH (ref 3.5–5.2)
Sodium: 138 mmol/L (ref 134–144)
eGFR: 5 mL/min/{1.73_m2} — ABNORMAL LOW (ref >59)

## 2023-07-02 LAB — CBC
Hematocrit: 34.4 % (ref 34.0–46.6)
Hemoglobin: 10.9 g/dL — ABNORMAL LOW (ref 11.1–15.9)
MCH: 31.4 pg (ref 26.6–33.0)
MCHC: 31.7 g/dL (ref 31.5–35.7)
MCV: 99 fL — ABNORMAL HIGH (ref 79–97)
Platelets: 193 10*3/uL (ref 150–450)
RBC: 3.47 x10E6/uL — ABNORMAL LOW (ref 3.77–5.28)
RDW: 13.7 % (ref 11.7–15.4)
WBC: 8.3 10*3/uL (ref 3.4–10.8)

## 2023-07-03 ENCOUNTER — Telehealth: Payer: Self-pay | Admitting: General Practice

## 2023-07-03 ENCOUNTER — Telehealth: Payer: Self-pay | Admitting: *Deleted

## 2023-07-03 NOTE — Telephone Encounter (Signed)
New message:        Patient is scheduled for a procedure at [facility] on Friday. [name] [name] told her that she would spend the night on Friday and she would have her Dialysis at the hospital on Saturday. Patient said she was just informed that, they were not going to do the Dialysis on Saturday. She says she must have her Dialysis. She could go to her regular place on Saturday, but transportation can not take her. She must have 3 days to notify transportation.She does not know what to do.

## 2023-07-03 NOTE — Telephone Encounter (Signed)
Spoke with patient she is aware to address concerns with inpatient. She is upset. Encounter from [facility] inpatient did advise for her to call her Dialysis center to see if she can come later on Saturday. Patient states her transportation is the problem . She needs 3 days to set up transportation she is really upset that she received the wrong information. I did apologize about the misunderstanding

## 2023-07-03 NOTE — Telephone Encounter (Signed)
Unsure if this is anything we can take care of.  Please advise

## 2023-07-03 NOTE — Telephone Encounter (Signed)
Cardiac Catheterization scheduled at [facility] for: Friday July 05, 2023 10:30 AM Arrival time [facility] Main Entrance A at: 8:30 AM  Nothing to eat after midnight prior to procedure, clear liquids until 5 AM day of procedure.  Medication instructions: -Hold:  Insulin-1/2 usual dose HS prior to procedure  Insulin-AM of procedure-pt reports she does not use Insulin in the AM -Other usual morning medications can be taken with sips of water including aspirin 81 mg and Plavix 75 mg.  Reviewed procedure instructions with patient.

## 2023-07-03 NOTE — Telephone Encounter (Addendum)
Patient reports transportation will be provided by a transportation service, she has the contact information for them.  Patient states that she will not have anyone to be with her on ride home or once she gets home if she is discharged the same day, will plan for overnight stay, she has made arrangements for transportation home on Saturday.  Patient dialysis schedule is Tu-Th-Sa.  Per RN Cath Lab-plan would be to discharge patient early Saturday and ask pt to see if her dialysis center would be able to do dialysis later in the day on Saturday.  I have discussed with patient and asked her to check with her dialysis center tomorrow when she goes for dialysis to see if she will be able to have dialysis later in the day on Saturday.

## 2023-07-04 ENCOUNTER — Telehealth: Payer: Self-pay | Admitting: Pulmonary Disease

## 2023-07-04 ENCOUNTER — Telehealth: Payer: Self-pay | Admitting: Licensed Clinical Social Worker

## 2023-07-04 NOTE — Telephone Encounter (Signed)
H&V Care Navigation CSW Progress Note  Clinical Social Worker contacted patient by phone to f/u on arrangements for transportation on Saturday. After leaving a voicemail and text message was finally able to reach pt at [phone]. Pt has multiple valid frustrations regarding communication. She is frustrated she was told she could have treatment in hospital, frustrated by having to change her dialysis treatment time, and by [name] calling her with so many changes today. She feels after her cath that she may want to change her cardiology providers.   Shared my apologies for the mistakes that have been made by cardiology team regarding communication. Shared if she felt comfortable that I would request that our team leadership reach out to her. [facility] would hope we could address any additional concerns and continue to provide her care if amenable.   Pt requests I text her with [facility] information and timing. I have sent this to number above. Will f/u with TOC team/inpatient team tomorrow to again provide f/u for arrangements.   Patient is participating in a Managed Medicaid Plan:  No, [employer]  SDOH Screenings   Food Insecurity: No Food Insecurity (04/13/2023)  Housing: Medium Risk (04/13/2023)  Transportation Needs: No Transportation Needs (04/13/2023)  Utilities: Not At Risk (04/13/2023)  Tobacco Use: Medium Risk (07/01/2023)    Octavio Graves, MSW, LCSW Clinical Social Worker II [facility] : [facility] Heart/Vascular Care Navigation  [phone]- work cell phone (preferred) [phone]- desk phone

## 2023-07-04 NOTE — Telephone Encounter (Signed)
[facility] know the [facility] level that is needed for patient. The prescription is for 3 ml but the patient states she needs 2 ml. Please call back and [phone]

## 2023-07-04 NOTE — Telephone Encounter (Addendum)
I spoke with [name] [name] at [facility] dialysis center [phone], patient would be able to do dialysis at 10:30 AM on Saturday 07/06/23. Patient uses [facility] transportation services [phone], and had already made arrangements for transportation home from the hospital on 07/06/23 since she thought she would have dialysis at the hospital Saturday before being discharged. I spoke with Rina at [facility] transportation services and they will not be able to provide transportation for the patient from [facility] to [facility] dialysis ( [address] [place] [address])  then to her home. I am told you have to call 3 days in advance to make transportation arrangements, I  spent almost 30 minutes on the phone with them trying to make these arrangements.

## 2023-07-04 NOTE — Telephone Encounter (Signed)
Left message for patient to call back to discuss cath/dialysis schedule.

## 2023-07-04 NOTE — Telephone Encounter (Addendum)
H&V Care Navigation CSW Progress Note  Clinical Social Worker  spoke with [name] [name]  to f/u on transportation needs. Pt has scheduled cath on 9/27. She has arranged transportation through her [facility] Medicare to the hospital.   Due to having no supervision at home she will be staying overnight at [facility] for observation. She had been told that she could have dialysis Saturday in the hospital which was incorrect. Therefore, she was left with no transportation since she only had a ride directly home from the hospital scheduled with [facility] Medicare and it it too short notice for her transportation benefits to be changed.   Her dialysis is scheduled Saturday morning at [facility] clinic [employer]) at 10:30am.  Arranged with [facility] team lead Marilu Favre permission and the go ahead from [place] [place] [facility], for [name] [phone]) to pick her up Saturday morning at 10am at main entrance- cath lab will speak with Cardiology rounder to let them know d/c orders etc need to be in before then.  She will be given information by [facility] on how to call when her dialysis treatment is done to get home.  They will call and confirm this all with patient tomorrow. This plan and information has been shared with inpatient [facility] team. Will f/u tomorrow as well.    Patient is participating in a Managed Medicaid Plan:  No, [employer] Medicare  SDOH Screenings   Food Insecurity: No Food Insecurity (04/13/2023)  Housing: Medium Risk (04/13/2023)  Transportation Needs: No Transportation Needs (04/13/2023)  Utilities: Not At Risk (04/13/2023)  Tobacco Use: Medium Risk (07/01/2023)   Octavio Graves, MSW, LCSW Clinical Social Worker II [facility] Heart/Vascular Care Navigation  [phone]- work cell phone (preferred) [phone]- desk phone   .

## 2023-07-05 ENCOUNTER — Other Ambulatory Visit: Payer: Self-pay

## 2023-07-05 ENCOUNTER — Encounter (HOSPITAL_COMMUNITY)
Admission: RE | Disposition: A | Discharge status: Home / Self Care | Payer: Self-pay | Source: Home / Self Care | Attending: Cardiovascular Disease

## 2023-07-05 ENCOUNTER — Ambulatory Visit (HOSPITAL_COMMUNITY)
Admission: RE | Admit: 2023-07-05 | Discharge: 2023-07-06 | Disposition: A | Discharge status: Home / Self Care | Payer: 59 | Attending: Cardiovascular Disease | Admitting: Cardiovascular Disease

## 2023-07-05 ENCOUNTER — Encounter (HOSPITAL_COMMUNITY): Payer: Self-pay | Admitting: Cardiovascular Disease

## 2023-07-05 DIAGNOSIS — I2584 Coronary atherosclerosis due to calcified coronary lesion: Secondary | ICD-10-CM | POA: Diagnosis not present

## 2023-07-05 DIAGNOSIS — E1151 Type 2 diabetes mellitus with diabetic peripheral angiopathy without gangrene: Secondary | ICD-10-CM | POA: Insufficient documentation

## 2023-07-05 DIAGNOSIS — E785 Hyperlipidemia, unspecified: Secondary | ICD-10-CM | POA: Diagnosis not present

## 2023-07-05 DIAGNOSIS — Z7902 Long term (current) use of antithrombotics/antiplatelets: Secondary | ICD-10-CM | POA: Diagnosis not present

## 2023-07-05 DIAGNOSIS — R079 Chest pain, unspecified: Secondary | ICD-10-CM

## 2023-07-05 DIAGNOSIS — G4733 Obstructive sleep apnea (adult) (pediatric): Secondary | ICD-10-CM | POA: Diagnosis not present

## 2023-07-05 DIAGNOSIS — Z6841 Body Mass Index (BMI) 40.0 and over, adult: Secondary | ICD-10-CM | POA: Insufficient documentation

## 2023-07-05 DIAGNOSIS — E1122 Type 2 diabetes mellitus with diabetic chronic kidney disease: Secondary | ICD-10-CM | POA: Diagnosis not present

## 2023-07-05 DIAGNOSIS — I132 Hypertensive heart and chronic kidney disease with heart failure and with stage 5 chronic kidney disease, or end stage renal disease: Secondary | ICD-10-CM | POA: Insufficient documentation

## 2023-07-05 DIAGNOSIS — N186 End stage renal disease: Secondary | ICD-10-CM | POA: Diagnosis not present

## 2023-07-05 DIAGNOSIS — I428 Other cardiomyopathies: Secondary | ICD-10-CM | POA: Insufficient documentation

## 2023-07-05 DIAGNOSIS — E669 Obesity, unspecified: Secondary | ICD-10-CM | POA: Insufficient documentation

## 2023-07-05 DIAGNOSIS — Z992 Dependence on renal dialysis: Secondary | ICD-10-CM | POA: Diagnosis not present

## 2023-07-05 DIAGNOSIS — I350 Nonrheumatic aortic (valve) stenosis: Secondary | ICD-10-CM | POA: Diagnosis not present

## 2023-07-05 DIAGNOSIS — E039 Hypothyroidism, unspecified: Secondary | ICD-10-CM | POA: Insufficient documentation

## 2023-07-05 DIAGNOSIS — I10 Essential (primary) hypertension: Secondary | ICD-10-CM | POA: Diagnosis present

## 2023-07-05 DIAGNOSIS — I2510 Atherosclerotic heart disease of native coronary artery without angina pectoris: Secondary | ICD-10-CM | POA: Insufficient documentation

## 2023-07-05 DIAGNOSIS — I5042 Chronic combined systolic (congestive) and diastolic (congestive) heart failure: Secondary | ICD-10-CM | POA: Diagnosis not present

## 2023-07-05 DIAGNOSIS — K219 Gastro-esophageal reflux disease without esophagitis: Secondary | ICD-10-CM | POA: Diagnosis not present

## 2023-07-05 DIAGNOSIS — Z8673 Personal history of transient ischemic attack (TIA), and cerebral infarction without residual deficits: Secondary | ICD-10-CM | POA: Diagnosis not present

## 2023-07-05 DIAGNOSIS — Z923 Personal history of irradiation: Secondary | ICD-10-CM | POA: Insufficient documentation

## 2023-07-05 DIAGNOSIS — J4489 Other specified chronic obstructive pulmonary disease: Secondary | ICD-10-CM | POA: Insufficient documentation

## 2023-07-05 DIAGNOSIS — Z79899 Other long term (current) drug therapy: Secondary | ICD-10-CM | POA: Insufficient documentation

## 2023-07-05 HISTORY — PX: LEFT HEART CATH AND CORONARY ANGIOGRAPHY: CATH118249

## 2023-07-05 LAB — GLUCOSE, CAPILLARY
Glucose-Capillary: 99 mg/dL (ref 70–99)
Glucose-Capillary: 100 mg/dL — ABNORMAL HIGH (ref 70–99)
Glucose-Capillary: 169 mg/dL — ABNORMAL HIGH (ref 70–99)

## 2023-07-05 SURGERY — LEFT HEART CATH AND CORONARY ANGIOGRAPHY
Anesthesia: LOCAL

## 2023-07-05 MED ORDER — MIDAZOLAM HCL 2 MG/2ML IJ SOLN
INTRAMUSCULAR | Status: AC
  Filled 2023-07-05: qty 2

## 2023-07-05 MED ORDER — FENTANYL CITRATE (PF) 100 MCG/2ML IJ SOLN
INTRAMUSCULAR | Status: DC | PRN
  Administered 2023-07-05: 25 ug via INTRAVENOUS

## 2023-07-05 MED ORDER — SODIUM CHLORIDE 0.9 % IV SOLN: INTRAVENOUS | Status: DC

## 2023-07-05 MED ORDER — ACETAMINOPHEN 325 MG PO TABS
650.0000 mg | ORAL_TABLET | ORAL | Status: DC | PRN
  Administered 2023-07-05: 650 mg via ORAL
  Filled 2023-07-05: qty 2

## 2023-07-05 MED ORDER — ALBUTEROL SULFATE (2.5 MG/3ML) 0.083% IN NEBU: 2.5000 mg | INHALATION_SOLUTION | Freq: Four times a day (QID) | RESPIRATORY_TRACT | Status: DC | PRN

## 2023-07-05 MED ORDER — HEPARIN (PORCINE) IN NACL 1000-0.9 UT/500ML-% IV SOLN
INTRAVENOUS | Status: DC | PRN
  Administered 2023-07-05: 1000 mL

## 2023-07-05 MED ORDER — ROSUVASTATIN CALCIUM 20 MG PO TABS
20.0000 mg | ORAL_TABLET | Freq: Every day | ORAL | Status: DC
  Administered 2023-07-05 – 2023-07-06 (×2): 20 mg via ORAL
  Filled 2023-07-05 (×2): qty 1

## 2023-07-05 MED ORDER — SODIUM CHLORIDE 0.9% FLUSH
3.0000 mL | Freq: Two times a day (BID) | INTRAVENOUS | Status: DC
  Administered 2023-07-05 – 2023-07-06 (×2): 3 mL via INTRAVENOUS

## 2023-07-05 MED ORDER — PANTOPRAZOLE SODIUM 40 MG PO TBEC
40.0000 mg | DELAYED_RELEASE_TABLET | Freq: Two times a day (BID) | ORAL | Status: DC
  Administered 2023-07-05 – 2023-07-06 (×2): 40 mg via ORAL
  Filled 2023-07-05 (×2): qty 1

## 2023-07-05 MED ORDER — CLOPIDOGREL BISULFATE 75 MG PO TABS
75.0000 mg | ORAL_TABLET | Freq: Every day | ORAL | Status: DC
  Administered 2023-07-06: 75 mg via ORAL
  Filled 2023-07-05: qty 1

## 2023-07-05 MED ORDER — LABETALOL HCL 5 MG/ML IV SOLN: 10.0000 mg | INTRAVENOUS | Status: AC | PRN

## 2023-07-05 MED ORDER — SEVELAMER CARBONATE 800 MG PO TABS
1600.0000 mg | ORAL_TABLET | Freq: Three times a day (TID) | ORAL | Status: DC
  Administered 2023-07-05 – 2023-07-06 (×2): 1600 mg via ORAL
  Filled 2023-07-05 (×2): qty 2

## 2023-07-05 MED ORDER — IOHEXOL 350 MG/ML SOLN
INTRAVENOUS | Status: DC | PRN
  Administered 2023-07-05: 40 mL

## 2023-07-05 MED ORDER — FENTANYL CITRATE (PF) 100 MCG/2ML IJ SOLN
INTRAMUSCULAR | Status: AC
  Filled 2023-07-05: qty 2

## 2023-07-05 MED ORDER — VERAPAMIL HCL 2.5 MG/ML IV SOLN
INTRAVENOUS | Status: AC
  Filled 2023-07-05: qty 2

## 2023-07-05 MED ORDER — DOCUSATE SODIUM 100 MG PO CAPS
100.0000 mg | ORAL_CAPSULE | Freq: Every day | ORAL | Status: DC
  Administered 2023-07-05 – 2023-07-06 (×2): 100 mg via ORAL
  Filled 2023-07-05 (×2): qty 1

## 2023-07-05 MED ORDER — CARVEDILOL 6.25 MG PO TABS
6.2500 mg | ORAL_TABLET | Freq: Two times a day (BID) | ORAL | Status: DC
  Administered 2023-07-05 – 2023-07-06 (×2): 6.25 mg via ORAL
  Filled 2023-07-05 (×2): qty 1

## 2023-07-05 MED ORDER — LEVOTHYROXINE SODIUM 75 MCG PO TABS
175.0000 ug | ORAL_TABLET | Freq: Every day | ORAL | Status: DC
  Administered 2023-07-06: 175 ug via ORAL
  Filled 2023-07-05: qty 1

## 2023-07-05 MED ORDER — POLYVINYL ALCOHOL 1.4 % OP SOLN: 1.0000 [drp] | Freq: Three times a day (TID) | OPHTHALMIC | Status: DC | PRN

## 2023-07-05 MED ORDER — UMECLIDINIUM BROMIDE 62.5 MCG/ACT IN AEPB
1.0000 | INHALATION_SPRAY | Freq: Every day | RESPIRATORY_TRACT | Status: DC
  Filled 2023-07-05: qty 7

## 2023-07-05 MED ORDER — SODIUM CHLORIDE 0.9 % IV SOLN: 250.0000 mL | INTRAVENOUS | Status: DC | PRN

## 2023-07-05 MED ORDER — ALBUTEROL SULFATE HFA 108 (90 BASE) MCG/ACT IN AERS: 2.0000 | INHALATION_SPRAY | Freq: Four times a day (QID) | RESPIRATORY_TRACT | Status: DC | PRN

## 2023-07-05 MED ORDER — ASPIRIN 81 MG PO CHEW
81.0000 mg | CHEWABLE_TABLET | Freq: Once | ORAL | Status: AC
  Administered 2023-07-05: 81 mg via ORAL
  Filled 2023-07-05: qty 1

## 2023-07-05 MED ORDER — LIDOCAINE HCL (PF) 1 % IJ SOLN
INTRAMUSCULAR | Status: AC
  Filled 2023-07-05: qty 30

## 2023-07-05 MED ORDER — ACETAMINOPHEN 500 MG PO TABS: 500.0000 mg | ORAL_TABLET | ORAL | Status: DC

## 2023-07-05 MED ORDER — NEPRO/CARBSTEADY PO LIQD
237.0000 mL | Freq: Two times a day (BID) | ORAL | Status: DC
  Administered 2023-07-06: 237 mL via ORAL

## 2023-07-05 MED ORDER — THIAMINE HCL 100 MG PO TABS
100.0000 mg | ORAL_TABLET | Freq: Every morning | ORAL | Status: DC
  Administered 2023-07-06: 100 mg via ORAL
  Filled 2023-07-05 (×2): qty 1

## 2023-07-05 MED ORDER — CLOPIDOGREL BISULFATE 75 MG PO TABS: 75.0000 mg | ORAL_TABLET | ORAL | Status: DC

## 2023-07-05 MED ORDER — ALLOPURINOL 100 MG PO TABS
100.0000 mg | ORAL_TABLET | Freq: Every day | ORAL | Status: DC
  Administered 2023-07-05 – 2023-07-06 (×2): 100 mg via ORAL
  Filled 2023-07-05 (×2): qty 1

## 2023-07-05 MED ORDER — FLUTICASONE PROPIONATE 50 MCG/ACT NA SUSP
1.0000 | Freq: Every day | NASAL | Status: DC
  Filled 2023-07-05: qty 16

## 2023-07-05 MED ORDER — HYDRALAZINE HCL 20 MG/ML IJ SOLN: 10.0000 mg | INTRAMUSCULAR | Status: AC | PRN

## 2023-07-05 MED ORDER — MIDAZOLAM HCL 2 MG/2ML IJ SOLN
INTRAMUSCULAR | Status: DC | PRN
  Administered 2023-07-05: .5 mg via INTRAVENOUS

## 2023-07-05 MED ORDER — UMECLIDINIUM BROMIDE 62.5 MCG/ACT IN AEPB
1.0000 | INHALATION_SPRAY | Freq: Every day | RESPIRATORY_TRACT | Status: DC
  Administered 2023-07-06: 1 via RESPIRATORY_TRACT
  Filled 2023-07-05: qty 7

## 2023-07-05 MED ORDER — ONDANSETRON HCL 4 MG/2ML IJ SOLN: 4.0000 mg | Freq: Four times a day (QID) | INTRAMUSCULAR | Status: DC | PRN

## 2023-07-05 MED ORDER — PREGABALIN 25 MG PO CAPS
25.0000 mg | ORAL_CAPSULE | Freq: Every day | ORAL | Status: DC
  Administered 2023-07-05 – 2023-07-06 (×2): 25 mg via ORAL
  Filled 2023-07-05 (×2): qty 1

## 2023-07-05 MED ORDER — LIDOCAINE HCL (PF) 1 % IJ SOLN
INTRAMUSCULAR | Status: DC | PRN
  Administered 2023-07-05: 20 mL via INTRADERMAL

## 2023-07-05 MED ORDER — SODIUM CHLORIDE 0.9% FLUSH: 3.0000 mL | INTRAVENOUS | Status: DC | PRN

## 2023-07-05 SURGICAL SUPPLY — 7 items
CATH INFINITI 5FR MULTPACK ANG (CATHETERS) ×1
KIT MICROPUNCTURE NIT STIFF (SHEATH) ×1
PACK CARDIAC CATHETERIZATION (CUSTOM PROCEDURE TRAY) ×1
SET ATX-X65L (MISCELLANEOUS) ×1
SHEATH PINNACLE 5F 10CM (SHEATH) ×1
SHEATH PROBE COVER 6X72 (BAG) ×1
WIRE EMERALD 3MM-J .035X150CM (WIRE) ×1

## 2023-07-05 NOTE — Interval H&P Note (Signed)
Cath Lab Visit (complete for each Cath Lab visit)  Clinical Evaluation Leading to the Procedure:   ACS: No.  Non-ACS:    Anginal Classification: CCS II  Anti-ischemic medical therapy: Minimal Therapy (1 class of medications)  Non-Invasive Test Results: High-risk stress test findings: cardiac mortality >3%/year  Prior CABG: No previous CABG      History and Physical Interval Note:  07/05/2023 10:52 AM  [name] [name]  has presented today for surgery, with the diagnosis of cad.  The various methods of treatment have been discussed with the patient and family. After consideration of risks, benefits and other options for treatment, the patient has consented to  Procedure(s): LEFT HEART CATH AND CORONARY ANGIOGRAPHY (N/A) as a surgical intervention.  The patient's history has been reviewed, patient examined, no change in status, stable for surgery.  I have reviewed the patient's chart and labs.  Questions were answered to the patient's satisfaction.     Nicki Guadalajara

## 2023-07-05 NOTE — Plan of Care (Signed)
Patient has been stable since surgery. Family at bedside. No complaints of pain. Will continue to monitor patient.

## 2023-07-05 NOTE — Social Work (Signed)
H&V Care Navigation CSW Progress Note  Clinical Social Worker  spoke with [name] [name], [place] and routed message at his request  to Roxine Caddy, NP, with cardiology to f/u on arrangements requested by cath team for pt due to miscommunication around dialysis treatment.   Due to having no supervision at home pt will be staying overnight at [facility] (per cath lab will tx to 6E for observation today). She had been told that she could have dialysis Saturday in the hospital, which was unfortunately incorrect. Therefore, she was left with no transportation since she only had a ride directly home from the hospital scheduled with [facility] Medicare and it it too short notice for her transportation benefits to be changed.    Her dialysis is scheduled Saturday morning at [facility] clinic on [address]) at 10:30am.  Arranged on 07/04/23 with [facility] team lead Verdon Cummins Scinto's permission and the go ahead from [place], [place] [facility]- team arranged for [facility] [phone]) to pick her up Saturday morning at 10am at main entrance- d/c orders etc need to be in before then as appropriate.    She will be given information by [facility] on how to call when her dialysis treatment is done to get home.   [facility] has called this Clinical research associate and confirmed pick up again this morning to ensure ride arranged for tomorrow. This plan and information has been shared with inpatient [facility] team, routed to [facility] and LCSW who will be inpatient this weekend and I have also communicated this to cardiology provider team as well.   If any issues arise and pt will not discharge then [facility] should be contacted at [phone] and [facility] team should be contacted for alternate arrangements.   Patient is participating in a Managed Medicaid Plan:  No, [employer].   SDOH Screenings   Food Insecurity: No Food Insecurity (04/13/2023)  Housing: Medium Risk (04/13/2023)  Transportation Needs: No Transportation Needs (04/13/2023)  Utilities:  Not At Risk (04/13/2023)  Tobacco Use: Medium Risk (07/01/2023)   Octavio Graves, MSW, LCSW Clinical Social Worker II [facility] Health Heart/Vascular Care Navigation

## 2023-07-05 NOTE — Progress Notes (Signed)
Site Area: right femoral   Site prior to removal: level 0   Pressure applied for: 20 minutes   Bedrest beginning at: 1220  Manual: yes  Patient status during pull: stable   Post pull groin site: level 0   Post pull instructions given: yes  Post pull pulses present: DP doppler on L and DP doppler on R  Dressing applied: gauze and tegaderm   Comments:

## 2023-07-05 NOTE — Telephone Encounter (Signed)
Pt is in the pre-op for her surgery and Nurse answered her phone. She will tell pt to call when she gets home/Monday with any questions at that time.

## 2023-07-06 DIAGNOSIS — I2510 Atherosclerotic heart disease of native coronary artery without angina pectoris: Secondary | ICD-10-CM | POA: Diagnosis not present

## 2023-07-06 DIAGNOSIS — I10 Essential (primary) hypertension: Secondary | ICD-10-CM | POA: Diagnosis not present

## 2023-07-06 DIAGNOSIS — N186 End stage renal disease: Secondary | ICD-10-CM | POA: Diagnosis not present

## 2023-07-06 LAB — BASIC METABOLIC PANEL
Anion gap: 12 (ref 5–15)
BUN: 64 mg/dL — ABNORMAL HIGH (ref 8–23)
CO2: 26 mmol/L (ref 22–32)
Calcium: 8.6 mg/dL — ABNORMAL LOW (ref 8.9–10.3)
Chloride: 96 mmol/L — ABNORMAL LOW (ref 98–111)
Creatinine, Ser: 7.67 mg/dL — ABNORMAL HIGH (ref 0.44–1.00)
GFR, Estimated: 5 mL/min — ABNORMAL LOW (ref >60)
Glucose, Bld: 144 mg/dL — ABNORMAL HIGH (ref 70–99)
Potassium: 5.4 mmol/L — ABNORMAL HIGH (ref 3.5–5.1)
Sodium: 134 mmol/L — ABNORMAL LOW (ref 135–145)

## 2023-07-06 LAB — LIPID PANEL
Cholesterol: 131 mg/dL (ref 0–200)
HDL: 25 mg/dL — ABNORMAL LOW (ref >40)
LDL Cholesterol: 63 mg/dL (ref 0–99)
Total CHOL/HDL Ratio: 5.2 {ratio}
Triglycerides: 213 mg/dL — ABNORMAL HIGH (ref <150)
VLDL: 43 mg/dL — ABNORMAL HIGH (ref 0–40)

## 2023-07-06 LAB — CBC
HCT: 34.7 % — ABNORMAL LOW (ref 36.0–46.0)
Hemoglobin: 10.4 g/dL — ABNORMAL LOW (ref 12.0–15.0)
MCH: 32 pg (ref 26.0–34.0)
MCHC: 30 g/dL (ref 30.0–36.0)
MCV: 106.8 fL — ABNORMAL HIGH (ref 80.0–100.0)
Platelets: 174 10*3/uL (ref 150–400)
RBC: 3.25 MIL/uL — ABNORMAL LOW (ref 3.87–5.11)
RDW: 14.9 % (ref 11.5–15.5)
WBC: 7.5 10*3/uL (ref 4.0–10.5)
nRBC: 0 % (ref 0.0–0.2)

## 2023-07-06 MED ORDER — NITROGLYCERIN 0.4 MG SL SUBL: 0.4000 mg | SUBLINGUAL_TABLET | SUBLINGUAL | 2 refills | Status: DC | PRN

## 2023-07-06 MED ORDER — ROSUVASTATIN CALCIUM 20 MG PO TABS: 20.0000 mg | ORAL_TABLET | Freq: Every day | ORAL | 2 refills | Status: DC

## 2023-07-06 NOTE — Discharge Summary (Signed)
Discharge Summary    Patient ID: [name] [name] MRN: [id number]; DOB: Apr 01, 1951  Admit date: 07/05/2023 Discharge date: 07/06/2023  PCP:  [name] Crofts, FNP   [facility] Providers Cardiologist:  Nanetta Batty, MD  Advanced Heart Failure:  Marca Ancona, MD  {    Discharge Diagnoses    Active Problems:   HTN (hypertension)   ESRD on dialysis [facility])   CAD (coronary artery disease)    Diagnostic Studies/Procedures    LHC on 07/05/23:  Fluoro time: 2.6 (min) DAP: 27.1 (Gycm2) Cumulative Air Kerma: 324.8 (mGy)  Diagnostic Dominance: Right     Prox Cx to Mid Cx lesion is 40% stenosed.   Mid Cx lesion is 30% stenosed.   Dist LAD lesion is 10% stenosed.   Recommend Aspirin 81mg  daily for moderate CAD.   There is significant coronary calcification involving the LAD and left circumflex vessel.    Nonobstructive CAD with smooth 10% mid LAD narrowings and left circumflex calcification with 40% proximal stenosis and smooth 30% mid AV groove stenosis.   Normal nondominant RCA.   RECOMMENDATION: Medical therapy for mild nonobstructive CAD.  Guideline directed therapy with reduced LV function in this patient with end-stage renal disease.  Since the patient does not have anyone at home, she will stay overnight for observation.  Plans are for dialysis tomorrow and apparently it has been arranged that she would be picked up to leave the hospital in early a.m.and taken for her dialysis.   _____________   History of Present Illness      Per office note on 07/01/23 by Edd Fabian NP-C:  [name] with a PMH of diabetes, HTN, HLD, ESRD on HD TTS,OSA, COPD, peripheral vascular disease, prior TIA, non-small cell lung cancer s/p curative radiation /in remission.  She underwent cardiac catheterization in 2015 which showed no significant coronary disease.  Her echocardiogram 2/23 showed an EF of 40-45%, mild LVH, G1 DD, and severe left atrial enlargement.  Mild  aortic stenosis with a mean gradient of 8 mmHg was also noted.   She was admitted with chest pain on 04/13/2023 and discharged on 04/17/2023.  Her repeat echocardiogram showed an LVEF of 35-40% and no new wall motion abnormalities.  Quantifying her pain was very difficult.  Outpatient PET scan was recommended.  She was continued on HD for her ESRD.  Her blood pressure was noted to be labile and her medications were unable to be titrated.  Her fluid volume is managed by HD.  No heart failure medications were able to be titrated at that time.   She presented to the clinic on 07/01/23 for follow-up evaluation and c/o episodes of chest pressure with not having her oxygen on and when she becomes angry.  She denied chest pain that day.  She cotninued with 3 L of oxygen nasal cannula.  She reported compliance with hemodialysis Tuesday Thursday Saturday.  The provider had reviewed her cardiac PET/CT.  She expressed understanding.  Case reviewed with DOD and she was arranged for LHC on 07/05/23.    07/01/23 she denied increased shortness of breath, lower extremity edema, fatigue, palpitations, melena, hematuria, hemoptysis, diaphoresis, weakness, presyncope, syncope, orthopnea, and PND.    Hospital Course     Consultants: N/A    Chest pain  Non -obstructive CAD - arranged outpatient LHC on 07/05/23 due to chest pain  - LHC showed non-obstructive moderate CAD with 40% prox to mid Cx, 30% mid Cx, 10% dis LAD.  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 Aortic stenosis  -Echo from 04/15/23: LVEF 35-40%, global HPK, grade I DD, normal RV, moderate elevated PASP 47.3 mmHg, mild LAE, trivial MR, no AS (LVEF 40-45% from 2023 Echo with mild AS mentioned ) -Clinically she is euvolemic -Continue dialysis for volume management, per nephrology -GDMT: She had historically not tolerating BiDil due to dizziness, headache, visual change; had refused amlodipine in the past; renal disease limiting GDMT; continue PTA Coreg   HTN - BP controlled  ESRD - continue routine dialysis   Type 2 diabetes COPD OSA Obesity  GERD Hypothyroidism  - chronic issues not addressed this encounter, home meds no change         Did the patient have an acute coronary syndrome (MI, NSTEMI, STEMI, etc) this admission?:  No                               Did the patient have a percutaneous coronary intervention (stent / angioplasty)?:  No.        The patient will be scheduled for a TOC follow up appointment in 13 days.  A message has been sent to the [facility] and Scheduling Pool at the office where the patient should be seen for follow up.  _____________  Discharge Vitals Blood pressure 128/61, pulse 96, temperature 97.7 F (36.5 C), temperature source Axillary, resp. rate 16, weight 109.7 kg, SpO2 99%.  Filed Weights   07/06/23 0442  Weight: 109.7 kg  Vitals:  Vitals:   07/06/23 0442 07/06/23 0804  BP:  128/61  Pulse: 91 96  Resp:  16  Temp:  97.7 F (36.5 C)  SpO2: 99% 99%   General Appearance: In no apparent distress, laying in bed, obese  HEENT: Normocephalic, atraumatic. Cardiovascular: Regular rate and rhythm, normal S1-S2,  no murmu Respiratory: Resting breathing unlabored, lungs sounds clear to auscultation bilaterally, no use of accessory muscles. On 2L [place] oxygen.  Gastrointestinal: Bowel sounds positive, abdomen soft,  non-tender Extremities: Able to move all extremities in bed without difficulty, right groin with dressing, difficult feel femoral pulse due to body habitus, pedal pulse 2+ RLE, right foot warm and well perfused, no significant bleeding/hematoma/tenderness of right groin site  Musculoskeletal: Normal muscle bulk and tone Neurologic: Alert, oriented to person, place and time. no gross focal neuro deficit     Labs & Radiologic Studies    CBC Recent Labs    07/06/23 0444  WBC 7.5  HGB 10.4*  HCT 34.7*  MCV 106.8*  PLT 174   Basic Metabolic Panel Recent Labs    40/98/11 0444  NA 134*  K 5.4*  CL 96*  CO2 26  GLUCOSE 144*  BUN 64*  CREATININE 7.67*  CALCIUM 8.6*   Liver Function Tests No results for input(s): "AST", "ALT", "ALKPHOS", "BILITOT", "PROT", "ALBUMIN" in the last 72 hours. No results for input(s): "  LIPASE", "AMYLASE" in the last 72 hours. High Sensitivity Troponin:   No results for input(s): "TROPONINIHS" in the last 720 hours.  BNP Invalid input(s): "POCBNP" D-Dimer No results for input(s): "DDIMER" in the last 72 hours. Hemoglobin A1C No results for input(s): "HGBA1C" in the last 72 hours. Fasting Lipid Panel No results for input(s): "CHOL", "HDL", "LDLCALC", "TRIG", "CHOLHDL", "LDLDIRECT" in the last 72 hours. Thyroid Function Tests No results for input(s): "TSH", "T4TOTAL", "T3FREE", "THYROIDAB" in the last 72 hours.  Invalid input(s): "FREET3" _____________  CARDIAC CATHETERIZATION  Result Date: 07/05/2023   Prox Cx to Mid Cx lesion is 40% stenosed.   Mid Cx lesion is 30% stenosed.   Dist LAD lesion is 10% stenosed.   Recommend Aspirin 81mg  daily for moderate CAD. There is significant coronary calcification involving the LAD and left circumflex vessel. Nonobstructive CAD with smooth 10% mid LAD narrowings and left circumflex calcification with 40% proximal stenosis and smooth 30% mid AV groove stenosis. Normal nondominant RCA. RECOMMENDATION: Medical  therapy for mild nonobstructive CAD.  Guideline directed therapy with reduced LV function in this patient with end-stage renal disease.  Since the patient does not have anyone at home, she will stay overnight for observation.  Plans are for dialysis tomorrow and apparently it has been arranged that she would be picked up to leave the hospital in early a.m.and taken for her dialysis.   Disposition   Patient was seen by Dr Wyline Mood today and deemed stable for discharged. Medication recommendation, post cath care, follow up plan discussed in detail at bedside. New script sent to her pharmacy today.  Patient understood and agreeable. Pt is being discharged home today in good condition.  Follow-up Plans & Appointments     Follow-up Information     Ronney Asters, NP Follow up on 07/18/2023.   Specialty: Cardiology Why: at 8:25am for your cardiology follow up appointment Contact information: [address] [place] [address] [phone]                Discharge Instructions     Diet - low sodium heart healthy   Complete by: As directed    Discharge instructions   Complete by: As directed    Groin Site Care  Refer to this sheet in the next few weeks. These instructions provide you with information on caring for yourself after your procedure. Your caregiver may also give you more specific instructions. Your treatment has been planned according to current medical practices, but problems sometimes occur. Call your caregiver if you have any problems or questions after your procedure.  HOME CARE INSTRUCTIONS You may shower 24 hours after the procedure. Remove the bandage (dressing) and gently wash the site with plain soap and water. Gently pat the site dry.  Do not apply powder or lotion to the site.  Do not sit in a bathtub, swimming pool, or whirlpool for 5 to 7 days.  No bending, squatting, or lifting anything over 10 pounds (4.5 kg) as directed by your caregiver.   Inspect the site at least twice daily.  Do not drive home if you are discharged the same day of the procedure. Have someone else drive you.  You may drive 24 hours after the procedure unless otherwise instructed by your caregiver.   What to expect: Any bruising will usually fade within 1 to 2 weeks.  Blood that collects in the tissue (hematoma) may be painful to the touch. It should usually decrease in size and tenderness  within 1 to 2 weeks.   SEEK IMMEDIATE MEDICAL CARE IF: You have unusual pain at the groin site or down the affected leg.  You have redness, warmth, swelling, or pain at the groin site.  You have drainage (other than a small amount of blood on the dressing).  You have chills.  You have a fever or persistent symptoms for more than 72 hours.  You have a fever and your symptoms suddenly get worse.  Your leg becomes pale, cool, tingly, or numb.  You have heavy bleeding from the site. Hold pressure on the site. Marland Kitchen  PLEASE REMEMBER TO BRING ALL OF YOUR MEDICATIONS TO EACH OF YOUR FOLLOW-UP OFFICE VISITS.  PLEASE ATTEND ALL SCHEDULED FOLLOW-UP APPOINTMENTS.   Activity: Increase activity slowly as tolerated. You may shower, but no soaking baths (or swimming) for 1 week. No driving for 24 hours. No lifting over 5 lbs for 1 week. No sexual activity for 1 week.   You May Return to Work: in 1 week (if applicable)  Wound Care: You may wash cath site gently with soap and water. Keep cath site clean and dry. If you notice pain, swelling, bleeding or pus at your cath site, please call [phone].   HEART FAILURE INSTRUCTIONS   Follow a low salt diet which means you need to consume less than 2 grams (2000mg ) of sodium per day.  Check the labels!  You'll be surprised to see how much sodium is in common foods and drinks.  DO NOT EAT foods high in salt, such as salted nuts, crackers, smoked fish, fast food, deli meats, salami, pepperoni, jerky, prosciutto, ham, bacon, soups, prepared  frozen dinners.  DO NOT add salt to your food when cooking or at the table.  It is ok to use other seasonings and flavors as long as they are salt free.    Drink less than 8 cups (which is ~2073ml or 2 liters) of fluid per day.  This is equivalent to ~ 4 bottles of water a day.  Weigh yourself every morning before breakfast and write it down in a log.  Bring in your record to every clinic visit.  Check for swelling in your feet, ankles, legs and stomach every morning.  Take an extra dose of your fluid pill once a day for worsening shortness of breath, swelling, or >3lb weight gain in 24 hours or >5lb weight gain in 1 week.  Control your blood pressure.  If you have a blood pressure cuff, record your blood pressure & heart rate daily. Bring the record to [facility] clinic with you.  Goal BP is <140/90  Control your blood sugars if you are diabetic  Control your cholesterol.  The goal LDL (bad cholesterol) is <70  Lose weight if you are overweight  Exercise as much as you are able to strengthen your heart  Take your medicine the way you are instructed   Bring ALL your medicines and medication list to [facility] CLINIC VISIT clinic   Get a yearly flu shot to reduce your risk of the flu   If you are under age 50 you need the Pneumovax pneumonia vaccine as a one time shot to reduce the risk of pneumonia   If you are 68 yrs or older you need the Prevnar pneumonia vaccine as a one time shot followed by a Pneumovax pneumonia vaccine > 1 year later as a one time shot to help reduce your risk of pneumonia   Call nurse if you have  trouble paying for your medications, are running low on pills, having trouble with transportation or if you are gaining weight quickly, having significant swelling, trouble breathing, chest pain, dizziness or fainting spells.          Increase activity slowly   Complete by: As directed         Discharge Medications   Allergies as of 07/06/2023       Reactions   Nebivolol  Swelling   Chest pain (reaction to Bystolic)   Zestril [lisinopril] Swelling, Other (See Comments)   Ace Inhibitors Swelling, Other (See Comments)   Tongue swell   Morphine And Codeine Itching        Medication List     TAKE these medications    acetaminophen 500 MG tablet Commonly known as: TYLENOL Take 1 tablet (500 mg total) by mouth See admin instructions. Take 1,000mg  (2 tablets) by mouth twice a day in addition to 1,000mg  (2 tablets) twice daily as needed for pain.   albuterol (2.5 MG/3ML) 0.083% nebulizer solution Commonly known as: PROVENTIL Take 3 mLs (2.5 mg total) by nebulization every 6 (six) hours as needed for wheezing or shortness of breath.   albuterol 108 (90 Base) MCG/ACT inhaler Commonly known as: VENTOLIN HFA Inhale 2 puffs into the lungs every 6 (six) hours as needed for shortness of breath (COPD).   allopurinol 100 MG tablet Commonly known as: ZYLOPRIM Take 1 tablet (100 mg total) by mouth daily.   budesonide-formoterol 160-4.5 MCG/ACT inhaler Commonly known as: SYMBICORT Inhale 2 puffs into the lungs in the morning and at bedtime.   carboxymethylcellulose 0.5 % Soln Commonly known as: REFRESH PLUS Place 1 drop into both eyes 3 (three) times daily as needed (dry/irritated eyes.).   carvedilol 6.25 MG tablet Commonly known as: COREG Take 1 tablet (6.25 mg total) by mouth 2 (two) times daily with a meal.   clopidogrel 75 MG tablet Commonly known as: PLAVIX Take 1 tablet (75 mg total) by mouth daily.   diclofenac Sodium 1 % Gel Commonly known as: VOLTAREN Apply 2 g topically 4 (four) times daily.   docusate sodium 100 MG capsule Commonly known as: COLACE Take 1 capsule (100 mg total) by mouth daily.   feeding supplement (NEPRO CARB STEADY) Liqd Take 237 mLs by mouth 2 (two) times daily between meals.   fluticasone 50 MCG/ACT nasal spray Commonly known as: FLONASE Place 1 spray into both nostrils daily.   Incruse Ellipta 62.5 MCG/ACT  Aepb Generic drug: umeclidinium bromide Inhale 1 puff into the lungs daily.   insulin lispro 100 UNIT/ML KwikPen Commonly known as: HumaLOG KwikPen 0-15 Units, Subcutaneous, 3 times daily with meal CBG < 70: Implement Hypoglycemia meausers CBG 70 - 120: 0 units CBG 121 - 150: 2 units CBG 151 - 200: 3 units CBG 201 - 250: 5 units CBG 251 - 300: 8 units CBG 301 - 350: 11 units CBG 351 - 400: 15 units CBG > 400: call MD What changed:  how much to take how to take this when to take this   levothyroxine 175 MCG tablet Commonly known as: SYNTHROID Take 1 tablet (175 mcg total) by mouth daily.   lidocaine 4 % Place 2 patches onto the skin daily. Each lower side of back.   lidocaine-prilocaine cream Commonly known as: EMLA Apply 1 Application topically See admin instructions. On dialysis days (Tues,Thurs,Sat)   meclizine 25 MG tablet Commonly known as: ANTIVERT Take 25 mg by mouth daily as needed.  MIRCERA IJ Inject 1 Syringe into the skin every 14 (fourteen) days.   multivitamins with iron Tabs tablet Take 1 tablet by mouth in the morning.   nitroGLYCERIN 0.4 MG SL tablet Commonly known as: Nitrostat Place 1 tablet (0.4 mg total) under the tongue every 5 (five) minutes as needed for chest pain.   OXYGEN Inhale into the lungs.   pantoprazole 40 MG tablet Commonly known as: PROTONIX Take 1 tablet (40 mg total) by mouth 2 (two) times daily before a meal.   pregabalin 25 MG capsule Commonly known as: LYRICA Take 25 mg by mouth daily.   rosuvastatin 20 MG tablet Commonly known as: CRESTOR Take 1 tablet (20 mg total) by mouth daily.   sevelamer carbonate 800 MG tablet Commonly known as: RENVELA Take 2 tablets (1,600 mg total) by mouth 3 (three) times daily with meals.   thiamine 100 MG tablet Commonly known as: VITAMIN B1 Take 1 tablet (100 mg total) by mouth in the morning.           Outstanding Labs/Studies     Duration of Discharge Encounter   Greater  than 30 minutes including physician time.  Signed, Cyndi Bender, NP 07/06/2023, 8:11 AM

## 2023-07-06 NOTE — TOC Transition Note (Signed)
Transition of Care [facility]) - CM/SW Discharge Note   Patient Details  Name: [name] [name] MRN: [id number] Date of Birth: 1951-03-07  Transition of Care [facility]) CM/SW Contact:  Ronny Bacon, RN Phone Number: 07/06/2023, 10:00 AM   Clinical Narrative:   patient is being discharged today. Per floor nurse patient has her portable oxygen tank at bedside and [facility] transportation is here to pick her up.    Final next level of care: Home/Self Care Barriers to Discharge: No Barriers Identified   Patient Goals and CMS Choice      Discharge Placement                         Discharge Plan and Services Additional resources added to the After Visit Summary for                                       Social Determinants of Health (SDOH) Interventions SDOH Screenings   Food Insecurity: No Food Insecurity (07/05/2023)  Housing: Patient Declined (07/05/2023)  Recent Concern: Housing - Medium Risk (04/13/2023)  Transportation Needs: No Transportation Needs (07/05/2023)  Utilities: Not At Risk (07/05/2023)  Tobacco Use: Medium Risk (07/05/2023)     Readmission Risk Interventions    05/09/2023    3:37 PM 05/08/2023   12:17 PM  Readmission Risk Prevention Plan  Transportation Screening Complete Complete  Medication Review [facility]) Complete Complete  PCP or Specialist appointment within 3-5 days of discharge Complete Complete  HRI or Home Care Consult Complete Complete  SW Recovery Care/Counseling Consult Complete Complete  Palliative Care Screening Not Applicable Not Applicable  Skilled Nursing Facility Complete Complete

## 2023-07-08 ENCOUNTER — Encounter (HOSPITAL_COMMUNITY): Payer: Self-pay | Admitting: Cardiovascular Disease

## 2023-07-08 LAB — LIPOPROTEIN A (LPA): Lipoprotein (a): 195.1 nmol/L — ABNORMAL HIGH (ref <75.0)

## 2023-07-10 NOTE — Telephone Encounter (Signed)
Returned call to [facility] and spoke with [place]. Informed our office has not seen patient since 1/24 and didn't place order for pts 02. Per hospital visit patient was on 3 L.

## 2023-07-11 ENCOUNTER — Emergency Department (HOSPITAL_COMMUNITY): Payer: 59

## 2023-07-11 ENCOUNTER — Inpatient Hospital Stay (HOSPITAL_COMMUNITY)
Admission: EM | Admit: 2023-07-11 | Discharge: 2023-07-20 | DRG: 871 | Disposition: A | Discharge status: Home Health | Payer: 59 | Attending: Internal Medicine | Admitting: Internal Medicine

## 2023-07-11 ENCOUNTER — Encounter (HOSPITAL_COMMUNITY): Payer: Self-pay

## 2023-07-11 ENCOUNTER — Other Ambulatory Visit: Payer: Self-pay

## 2023-07-11 DIAGNOSIS — R6521 Severe sepsis with septic shock: Secondary | ICD-10-CM | POA: Diagnosis present

## 2023-07-11 DIAGNOSIS — J9601 Acute respiratory failure with hypoxia: Principal | ICD-10-CM

## 2023-07-11 DIAGNOSIS — A419 Sepsis, unspecified organism: Secondary | ICD-10-CM | POA: Diagnosis present

## 2023-07-11 DIAGNOSIS — Z87891 Personal history of nicotine dependence: Secondary | ICD-10-CM

## 2023-07-11 DIAGNOSIS — E662 Morbid (severe) obesity with alveolar hypoventilation: Secondary | ICD-10-CM | POA: Diagnosis present

## 2023-07-11 DIAGNOSIS — J441 Chronic obstructive pulmonary disease with (acute) exacerbation: Secondary | ICD-10-CM | POA: Diagnosis present

## 2023-07-11 DIAGNOSIS — E890 Postprocedural hypothyroidism: Secondary | ICD-10-CM | POA: Diagnosis present

## 2023-07-11 DIAGNOSIS — I5042 Chronic combined systolic (congestive) and diastolic (congestive) heart failure: Secondary | ICD-10-CM | POA: Diagnosis present

## 2023-07-11 DIAGNOSIS — I428 Other cardiomyopathies: Secondary | ICD-10-CM | POA: Diagnosis present

## 2023-07-11 DIAGNOSIS — E1151 Type 2 diabetes mellitus with diabetic peripheral angiopathy without gangrene: Secondary | ICD-10-CM | POA: Diagnosis present

## 2023-07-11 DIAGNOSIS — J9602 Acute respiratory failure with hypercapnia: Secondary | ICD-10-CM | POA: Diagnosis present

## 2023-07-11 DIAGNOSIS — I2510 Atherosclerotic heart disease of native coronary artery without angina pectoris: Secondary | ICD-10-CM | POA: Diagnosis present

## 2023-07-11 DIAGNOSIS — E8770 Fluid overload, unspecified: Secondary | ICD-10-CM | POA: Diagnosis present

## 2023-07-11 DIAGNOSIS — J810 Acute pulmonary edema: Secondary | ICD-10-CM | POA: Diagnosis not present

## 2023-07-11 DIAGNOSIS — E785 Hyperlipidemia, unspecified: Secondary | ICD-10-CM | POA: Diagnosis present

## 2023-07-11 DIAGNOSIS — I447 Left bundle-branch block, unspecified: Secondary | ICD-10-CM | POA: Diagnosis present

## 2023-07-11 DIAGNOSIS — Z888 Allergy status to other drugs, medicaments and biological substances status: Secondary | ICD-10-CM

## 2023-07-11 DIAGNOSIS — E1121 Type 2 diabetes mellitus with diabetic nephropathy: Secondary | ICD-10-CM

## 2023-07-11 DIAGNOSIS — D631 Anemia in chronic kidney disease: Secondary | ICD-10-CM | POA: Diagnosis present

## 2023-07-11 DIAGNOSIS — Z6841 Body Mass Index (BMI) 40.0 and over, adult: Secondary | ICD-10-CM | POA: Diagnosis not present

## 2023-07-11 DIAGNOSIS — R578 Other shock: Secondary | ICD-10-CM | POA: Diagnosis present

## 2023-07-11 DIAGNOSIS — I480 Paroxysmal atrial fibrillation: Secondary | ICD-10-CM | POA: Diagnosis present

## 2023-07-11 DIAGNOSIS — Z992 Dependence on renal dialysis: Secondary | ICD-10-CM

## 2023-07-11 DIAGNOSIS — Z7902 Long term (current) use of antithrombotics/antiplatelets: Secondary | ICD-10-CM

## 2023-07-11 DIAGNOSIS — F05 Delirium due to known physiological condition: Secondary | ICD-10-CM | POA: Diagnosis not present

## 2023-07-11 DIAGNOSIS — Z794 Long term (current) use of insulin: Secondary | ICD-10-CM

## 2023-07-11 DIAGNOSIS — I4819 Other persistent atrial fibrillation: Secondary | ICD-10-CM | POA: Diagnosis not present

## 2023-07-11 DIAGNOSIS — Z7951 Long term (current) use of inhaled steroids: Secondary | ICD-10-CM

## 2023-07-11 DIAGNOSIS — E1122 Type 2 diabetes mellitus with diabetic chronic kidney disease: Secondary | ICD-10-CM | POA: Diagnosis present

## 2023-07-11 DIAGNOSIS — I953 Hypotension of hemodialysis: Secondary | ICD-10-CM | POA: Diagnosis not present

## 2023-07-11 DIAGNOSIS — K219 Gastro-esophageal reflux disease without esophagitis: Secondary | ICD-10-CM | POA: Diagnosis present

## 2023-07-11 DIAGNOSIS — J9621 Acute and chronic respiratory failure with hypoxia: Secondary | ICD-10-CM

## 2023-07-11 DIAGNOSIS — N186 End stage renal disease: Secondary | ICD-10-CM | POA: Diagnosis present

## 2023-07-11 DIAGNOSIS — E8729 Other acidosis: Secondary | ICD-10-CM | POA: Diagnosis present

## 2023-07-11 DIAGNOSIS — Z9981 Dependence on supplemental oxygen: Secondary | ICD-10-CM

## 2023-07-11 DIAGNOSIS — I132 Hypertensive heart and chronic kidney disease with heart failure and with stage 5 chronic kidney disease, or end stage renal disease: Secondary | ICD-10-CM | POA: Diagnosis present

## 2023-07-11 DIAGNOSIS — M109 Gout, unspecified: Secondary | ICD-10-CM | POA: Diagnosis present

## 2023-07-11 DIAGNOSIS — J440 Chronic obstructive pulmonary disease with acute lower respiratory infection: Secondary | ICD-10-CM | POA: Diagnosis present

## 2023-07-11 DIAGNOSIS — Z7901 Long term (current) use of anticoagulants: Secondary | ICD-10-CM

## 2023-07-11 DIAGNOSIS — J189 Pneumonia, unspecified organism: Secondary | ICD-10-CM | POA: Diagnosis present

## 2023-07-11 DIAGNOSIS — Z85118 Personal history of other malignant neoplasm of bronchus and lung: Secondary | ICD-10-CM

## 2023-07-11 DIAGNOSIS — Z8249 Family history of ischemic heart disease and other diseases of the circulatory system: Secondary | ICD-10-CM

## 2023-07-11 DIAGNOSIS — Z66 Do not resuscitate: Secondary | ICD-10-CM | POA: Diagnosis present

## 2023-07-11 DIAGNOSIS — Z8614 Personal history of Methicillin resistant Staphylococcus aureus infection: Secondary | ICD-10-CM

## 2023-07-11 DIAGNOSIS — Z79890 Hormone replacement therapy (postmenopausal): Secondary | ICD-10-CM

## 2023-07-11 DIAGNOSIS — I5043 Acute on chronic combined systolic (congestive) and diastolic (congestive) heart failure: Secondary | ICD-10-CM | POA: Diagnosis not present

## 2023-07-11 DIAGNOSIS — I4719 Other supraventricular tachycardia: Secondary | ICD-10-CM | POA: Diagnosis not present

## 2023-07-11 DIAGNOSIS — J9622 Acute and chronic respiratory failure with hypercapnia: Secondary | ICD-10-CM | POA: Diagnosis present

## 2023-07-11 DIAGNOSIS — G894 Chronic pain syndrome: Secondary | ICD-10-CM | POA: Diagnosis present

## 2023-07-11 DIAGNOSIS — Z809 Family history of malignant neoplasm, unspecified: Secondary | ICD-10-CM

## 2023-07-11 DIAGNOSIS — I5022 Chronic systolic (congestive) heart failure: Secondary | ICD-10-CM | POA: Diagnosis not present

## 2023-07-11 DIAGNOSIS — Z8673 Personal history of transient ischemic attack (TIA), and cerebral infarction without residual deficits: Secondary | ICD-10-CM

## 2023-07-11 DIAGNOSIS — I4891 Unspecified atrial fibrillation: Secondary | ICD-10-CM | POA: Diagnosis not present

## 2023-07-11 DIAGNOSIS — R5381 Other malaise: Secondary | ICD-10-CM | POA: Diagnosis present

## 2023-07-11 DIAGNOSIS — E1165 Type 2 diabetes mellitus with hyperglycemia: Secondary | ICD-10-CM | POA: Diagnosis present

## 2023-07-11 DIAGNOSIS — Z885 Allergy status to narcotic agent status: Secondary | ICD-10-CM

## 2023-07-11 DIAGNOSIS — Z96641 Presence of right artificial hip joint: Secondary | ICD-10-CM | POA: Diagnosis present

## 2023-07-11 DIAGNOSIS — R079 Chest pain, unspecified: Secondary | ICD-10-CM | POA: Diagnosis not present

## 2023-07-11 DIAGNOSIS — G4700 Insomnia, unspecified: Secondary | ICD-10-CM | POA: Diagnosis present

## 2023-07-11 DIAGNOSIS — M898X9 Other specified disorders of bone, unspecified site: Secondary | ICD-10-CM | POA: Diagnosis present

## 2023-07-11 DIAGNOSIS — Z833 Family history of diabetes mellitus: Secondary | ICD-10-CM

## 2023-07-11 DIAGNOSIS — G9341 Metabolic encephalopathy: Secondary | ICD-10-CM

## 2023-07-11 DIAGNOSIS — Z923 Personal history of irradiation: Secondary | ICD-10-CM

## 2023-07-11 DIAGNOSIS — K5900 Constipation, unspecified: Secondary | ICD-10-CM | POA: Diagnosis present

## 2023-07-11 DIAGNOSIS — Z79899 Other long term (current) drug therapy: Secondary | ICD-10-CM

## 2023-07-11 LAB — CBC WITH DIFFERENTIAL/PLATELET
Abs Immature Granulocytes: 0.1 10*3/uL — ABNORMAL HIGH (ref 0.00–0.07)
Basophils Absolute: 0 10*3/uL (ref 0.0–0.1)
Basophils Relative: 0 %
Eosinophils Absolute: 0.1 10*3/uL (ref 0.0–0.5)
Eosinophils Relative: 1 %
HCT: 42.1 % (ref 36.0–46.0)
Hemoglobin: 11.8 g/dL — ABNORMAL LOW (ref 12.0–15.0)
Immature Granulocytes: 1 %
Lymphocytes Relative: 9 %
Lymphs Abs: 0.9 10*3/uL (ref 0.7–4.0)
MCH: 31.6 pg (ref 26.0–34.0)
MCHC: 28 g/dL — ABNORMAL LOW (ref 30.0–36.0)
MCV: 112.6 fL — ABNORMAL HIGH (ref 80.0–100.0)
Monocytes Absolute: 0.7 10*3/uL (ref 0.1–1.0)
Monocytes Relative: 6 %
Neutro Abs: 9 10*3/uL — ABNORMAL HIGH (ref 1.7–7.7)
Neutrophils Relative %: 83 %
Platelets: 237 10*3/uL (ref 150–400)
RBC: 3.74 MIL/uL — ABNORMAL LOW (ref 3.87–5.11)
RDW: 14.7 % (ref 11.5–15.5)
WBC: 10.8 10*3/uL — ABNORMAL HIGH (ref 4.0–10.5)
nRBC: 0.6 % — ABNORMAL HIGH (ref 0.0–0.2)

## 2023-07-11 LAB — COMPREHENSIVE METABOLIC PANEL
ALT: 13 U/L (ref 0–44)
AST: 23 U/L (ref 15–41)
Albumin: 3.6 g/dL (ref 3.5–5.0)
Alkaline Phosphatase: 81 U/L (ref 38–126)
Anion gap: 12 (ref 5–15)
BUN: 28 mg/dL — ABNORMAL HIGH (ref 8–23)
CO2: 31 mmol/L (ref 22–32)
Calcium: 9 mg/dL (ref 8.9–10.3)
Chloride: 95 mmol/L — ABNORMAL LOW (ref 98–111)
Creatinine, Ser: 4.92 mg/dL — ABNORMAL HIGH (ref 0.44–1.00)
GFR, Estimated: 9 mL/min — ABNORMAL LOW (ref >60)
Glucose, Bld: 145 mg/dL — ABNORMAL HIGH (ref 70–99)
Potassium: 4.8 mmol/L (ref 3.5–5.1)
Sodium: 138 mmol/L (ref 135–145)
Total Bilirubin: 0.4 mg/dL (ref 0.3–1.2)
Total Protein: 7.4 g/dL (ref 6.5–8.1)

## 2023-07-11 LAB — I-STAT VENOUS BLOOD GAS, ED
Acid-Base Excess: 3 mmol/L — ABNORMAL HIGH (ref 0.0–2.0)
Bicarbonate: 35.5 mmol/L — ABNORMAL HIGH (ref 20.0–28.0)
Calcium, Ion: 1.13 mmol/L — ABNORMAL LOW (ref 1.15–1.40)
HCT: 41 % (ref 36.0–46.0)
Hemoglobin: 13.9 g/dL (ref 12.0–15.0)
O2 Saturation: 75 %
Potassium: 4.7 mmol/L (ref 3.5–5.1)
Sodium: 137 mmol/L (ref 135–145)
TCO2: 38 mmol/L — ABNORMAL HIGH (ref 22–32)
pCO2, Ven: 97.6 mm[Hg] (ref 44–60)
pH, Ven: 7.169 — CL (ref 7.25–7.43)
pO2, Ven: 54 mm[Hg] — ABNORMAL HIGH (ref 32–45)

## 2023-07-11 LAB — I-STAT CHEM 8, ED
BUN: 33 mg/dL — ABNORMAL HIGH (ref 8–23)
Calcium, Ion: 1.14 mmol/L — ABNORMAL LOW (ref 1.15–1.40)
Chloride: 99 mmol/L (ref 98–111)
Creatinine, Ser: 5.1 mg/dL — ABNORMAL HIGH (ref 0.44–1.00)
Glucose, Bld: 144 mg/dL — ABNORMAL HIGH (ref 70–99)
HCT: 42 % (ref 36.0–46.0)
Hemoglobin: 14.3 g/dL (ref 12.0–15.0)
Potassium: 4.7 mmol/L (ref 3.5–5.1)
Sodium: 139 mmol/L (ref 135–145)
TCO2: 34 mmol/L — ABNORMAL HIGH (ref 22–32)

## 2023-07-11 LAB — POCT I-STAT 7, (LYTES, BLD GAS, ICA,H+H)
Acid-Base Excess: 5 mmol/L — ABNORMAL HIGH (ref 0.0–2.0)
Bicarbonate: 37.1 mmol/L — ABNORMAL HIGH (ref 20.0–28.0)
Calcium, Ion: 1.23 mmol/L (ref 1.15–1.40)
HCT: 38 % (ref 36.0–46.0)
Hemoglobin: 12.9 g/dL (ref 12.0–15.0)
O2 Saturation: 78 %
Patient temperature: 97.7
Potassium: 4.8 mmol/L (ref 3.5–5.1)
Sodium: 138 mmol/L (ref 135–145)
TCO2: 40 mmol/L — ABNORMAL HIGH (ref 22–32)
pCO2 arterial: 104.8 mm[Hg] (ref 32–48)
pH, Arterial: 7.153 — CL (ref 7.35–7.45)
pO2, Arterial: 56 mm[Hg] — ABNORMAL LOW (ref 83–108)

## 2023-07-11 LAB — HEPATITIS B SURFACE ANTIGEN: Hepatitis B Surface Ag: NONREACTIVE

## 2023-07-11 LAB — MRSA NEXT GEN BY PCR, NASAL: MRSA by PCR Next Gen: NOT DETECTED

## 2023-07-11 LAB — CBG MONITORING, ED: Glucose-Capillary: 142 mg/dL — ABNORMAL HIGH (ref 70–99)

## 2023-07-11 LAB — GLUCOSE, CAPILLARY
Glucose-Capillary: 149 mg/dL — ABNORMAL HIGH (ref 70–99)
Glucose-Capillary: 162 mg/dL — ABNORMAL HIGH (ref 70–99)
Glucose-Capillary: 163 mg/dL — ABNORMAL HIGH (ref 70–99)

## 2023-07-11 LAB — PROCALCITONIN: Procalcitonin: 0.52 ng/mL

## 2023-07-11 LAB — I-STAT CG4 LACTIC ACID, ED: Lactic Acid, Venous: 0.9 mmol/L (ref 0.5–1.9)

## 2023-07-11 LAB — TROPONIN I (HIGH SENSITIVITY)
Troponin I (High Sensitivity): 90 ng/L — ABNORMAL HIGH (ref <18)
Troponin I (High Sensitivity): 103 ng/L (ref <18)

## 2023-07-11 LAB — BRAIN NATRIURETIC PEPTIDE: B Natriuretic Peptide: 903.7 pg/mL — ABNORMAL HIGH (ref 0.0–100.0)

## 2023-07-11 LAB — TSH: TSH: 4.064 u[IU]/mL (ref 0.350–4.500)

## 2023-07-11 LAB — ETHANOL: Alcohol, Ethyl (B): <10 mg/dL (ref <10)

## 2023-07-11 MED ORDER — IPRATROPIUM-ALBUTEROL 0.5-2.5 (3) MG/3ML IN SOLN
3.0000 mL | RESPIRATORY_TRACT | Status: DC | PRN
  Administered 2023-07-16 (×2): 3 mL via RESPIRATORY_TRACT
  Filled 2023-07-11 (×2): qty 3

## 2023-07-11 MED ORDER — ALTEPLASE 2 MG IJ SOLR
2.0000 mg | Freq: Once | INTRAMUSCULAR | Status: DC | PRN
  Filled 2023-07-11: qty 2

## 2023-07-11 MED ORDER — HEPARIN SODIUM (PORCINE) 5000 UNIT/ML IJ SOLN
5000.0000 [IU] | Freq: Three times a day (TID) | INTRAMUSCULAR | Status: DC
  Administered 2023-07-11 – 2023-07-18 (×21): 5000 [IU] via SUBCUTANEOUS
  Filled 2023-07-11 (×23): qty 1

## 2023-07-11 MED ORDER — LIDOCAINE-PRILOCAINE 2.5-2.5 % EX CREA: 1.0000 | TOPICAL_CREAM | CUTANEOUS | Status: DC | PRN

## 2023-07-11 MED ORDER — DOCUSATE SODIUM 100 MG PO CAPS: 100.0000 mg | ORAL_CAPSULE | Freq: Two times a day (BID) | ORAL | Status: DC | PRN

## 2023-07-11 MED ORDER — NOREPINEPHRINE 4 MG/250ML-% IV SOLN
2.0000 ug/min | INTRAVENOUS | Status: DC
  Administered 2023-07-11: 2 ug/min via INTRAVENOUS
  Administered 2023-07-12: 9 ug/min via INTRAVENOUS
  Administered 2023-07-12: 8 ug/min via INTRAVENOUS
  Administered 2023-07-12: 5 ug/min via INTRAVENOUS
  Administered 2023-07-13 (×2): 6 ug/min via INTRAVENOUS
  Filled 2023-07-11 (×6): qty 250

## 2023-07-11 MED ORDER — PENTAFLUOROPROP-TETRAFLUOROETH EX AERO: 1.0000 | INHALATION_SPRAY | CUTANEOUS | Status: DC | PRN

## 2023-07-11 MED ORDER — HEPARIN SODIUM (PORCINE) 1000 UNIT/ML DIALYSIS
1000.0000 [IU] | INTRAMUSCULAR | Status: DC | PRN
  Filled 2023-07-11 (×3): qty 1

## 2023-07-11 MED ORDER — POLYETHYLENE GLYCOL 3350 17 G PO PACK: 17.0000 g | PACK | Freq: Every day | ORAL | Status: DC | PRN

## 2023-07-11 MED ORDER — IPRATROPIUM-ALBUTEROL 0.5-2.5 (3) MG/3ML IN SOLN
3.0000 mL | Freq: Once | RESPIRATORY_TRACT | Status: AC
  Administered 2023-07-11: 3 mL via RESPIRATORY_TRACT
  Filled 2023-07-11: qty 3

## 2023-07-11 MED ORDER — SODIUM BICARBONATE 8.4 % IV SOLN
100.0000 meq | Freq: Once | INTRAVENOUS | Status: AC
  Administered 2023-07-11: 100 meq via INTRAVENOUS
  Filled 2023-07-11: qty 50

## 2023-07-11 MED ORDER — NALOXONE HCL 0.4 MG/ML IJ SOLN
0.4000 mg | Freq: Once | INTRAMUSCULAR | Status: AC
  Administered 2023-07-11: 0.4 mg via INTRAVENOUS
  Filled 2023-07-11: qty 1

## 2023-07-11 MED ORDER — HEPARIN SODIUM (PORCINE) 1000 UNIT/ML DIALYSIS
2000.0000 [IU] | INTRAMUSCULAR | Status: AC | PRN
  Administered 2023-07-16: 2000 [IU] via INTRAVENOUS_CENTRAL
  Filled 2023-07-11: qty 2

## 2023-07-11 MED ORDER — IPRATROPIUM-ALBUTEROL 0.5-2.5 (3) MG/3ML IN SOLN
3.0000 mL | RESPIRATORY_TRACT | Status: DC
  Administered 2023-07-11 – 2023-07-12 (×4): 3 mL via RESPIRATORY_TRACT
  Filled 2023-07-11 (×5): qty 3

## 2023-07-11 MED ORDER — ALBUMIN HUMAN 25 % IV SOLN
25.0000 g | Freq: Once | INTRAVENOUS | Status: DC
  Filled 2023-07-11: qty 100

## 2023-07-11 MED ORDER — NALOXONE HCL 4 MG/0.1ML NA LIQD: 1.0000 | Freq: Once | NASAL | Status: DC

## 2023-07-11 MED ORDER — ANTICOAGULANT SODIUM CITRATE 4% (200MG/5ML) IV SOLN
5.0000 mL | Status: DC | PRN
  Filled 2023-07-11: qty 5

## 2023-07-11 MED ORDER — SODIUM CHLORIDE 0.9 % IV SOLN: 250.0000 mL | INTRAVENOUS | Status: DC

## 2023-07-11 MED ORDER — DEXMEDETOMIDINE HCL IN NACL 400 MCG/100ML IV SOLN
0.0000 ug/kg/h | INTRAVENOUS | Status: DC
  Administered 2023-07-12: 0.8 ug/kg/h via INTRAVENOUS
  Administered 2023-07-12 (×2): 0.4 ug/kg/h via INTRAVENOUS
  Administered 2023-07-13 (×2): 1 ug/kg/h via INTRAVENOUS
  Filled 2023-07-11 (×5): qty 100

## 2023-07-11 MED ORDER — CHLORHEXIDINE GLUCONATE CLOTH 2 % EX PADS
6.0000 | MEDICATED_PAD | Freq: Every day | CUTANEOUS | Status: DC
  Administered 2023-07-12 – 2023-07-19 (×8): 6 via TOPICAL

## 2023-07-11 MED ORDER — ARFORMOTEROL TARTRATE 15 MCG/2ML IN NEBU
15.0000 ug | INHALATION_SOLUTION | Freq: Two times a day (BID) | RESPIRATORY_TRACT | Status: DC
  Administered 2023-07-11 – 2023-07-20 (×18): 15 ug via RESPIRATORY_TRACT
  Filled 2023-07-11 (×19): qty 2

## 2023-07-11 MED ORDER — BUDESONIDE 0.5 MG/2ML IN SUSP
0.5000 mg | Freq: Two times a day (BID) | RESPIRATORY_TRACT | Status: DC
  Administered 2023-07-11 – 2023-07-20 (×18): 0.5 mg via RESPIRATORY_TRACT
  Filled 2023-07-11 (×18): qty 2

## 2023-07-11 MED ORDER — LIDOCAINE HCL (PF) 1 % IJ SOLN: 5.0000 mL | INTRAMUSCULAR | Status: DC | PRN

## 2023-07-11 MED ORDER — HEPARIN SODIUM (PORCINE) 1000 UNIT/ML DIALYSIS
2000.0000 [IU] | Freq: Once | INTRAMUSCULAR | Status: AC
  Administered 2023-07-16: 2000 [IU] via INTRAVENOUS_CENTRAL
  Filled 2023-07-11: qty 2

## 2023-07-11 MED ORDER — REVEFENACIN 175 MCG/3ML IN SOLN
175.0000 ug | Freq: Every day | RESPIRATORY_TRACT | Status: DC
  Administered 2023-07-11 – 2023-07-20 (×9): 175 ug via RESPIRATORY_TRACT
  Filled 2023-07-11 (×9): qty 3

## 2023-07-11 MED ORDER — NEPRO/CARBSTEADY PO LIQD: 237.0000 mL | ORAL | Status: DC | PRN

## 2023-07-11 NOTE — ED Notes (Signed)
Pt states wishes to not be intubated. Dr. Tonia Brooms at bedside.

## 2023-07-11 NOTE — Progress Notes (Signed)
RT took pt off bipap and placed pt on a NRB to go to CT. When pt got back from CT, RT tried placing pt back on bipap and pt become very aggressive, refusing to put bipap back on. RN and MD notified and pt placed on 4L [place] per MD with sat goal above 91%.

## 2023-07-11 NOTE — ED Notes (Signed)
Pulmonary DO at bedside.

## 2023-07-11 NOTE — Plan of Care (Signed)
PCCM:  I had a GOC discussion with patient and son at bedside. Nursing was also present.   She was very clear that she would not want to be intubated and the son confirmed this.   DNI orders placed  Josephine Igo, DO [facility] 07/11/2023 4:49 PM

## 2023-07-11 NOTE — ED Notes (Signed)
Pt refusing to be placed back on the Bi-Pap at this time.

## 2023-07-11 NOTE — ED Provider Notes (Signed)
[place] EMERGENCY DEPARTMENT AT [facility] Provider Note   CSN: [id number] Arrival date & time: 07/11/23  1136     History  Chief Complaint  Patient presents with   Altered Mental Status    [name] [name] is a 72 y.o. female.  HPI Patient completed her dialysis session today.  However she was noted to be extremely drowsy and sent to the emergency department.  At baseline patient is reportedly on 3 L nasal cannula home oxygen.  The patient is a poor historian.  On arrival she initially refused any treatment stating she wanted to go home.  However she was excessively drowsy and not appropriate for independent decision making.  I have been able to talk to the patient's son Ray and he advises that she was doing well and pretty normal yesterday.  We discussed her medications and if she might be taking sedating medications.  He reviewed her medications and reports she is taking Flexeril and meclizine right now.    Home Medications Prior to Admission medications   Medication Sig Start Date End Date Taking? Authorizing Provider  acetaminophen (TYLENOL) 500 MG tablet Take 1 tablet (500 mg total) by mouth See admin instructions. Take 1,000mg  (2 tablets) by mouth twice a day in addition to 1,000mg  (2 tablets) twice daily as needed for pain. 05/14/23   Ghimire, Werner Lean, MD  albuterol (PROVENTIL) (2.5 MG/3ML) 0.083% nebulizer solution Take 3 mLs (2.5 mg total) by nebulization every 6 (six) hours as needed for wheezing or shortness of breath. 05/14/23   Ghimire, Werner Lean, MD  albuterol (VENTOLIN HFA) 108 (90 Base) MCG/ACT inhaler Inhale 2 puffs into the lungs every 6 (six) hours as needed for shortness of breath (COPD). 05/14/23   Ghimire, Werner Lean, MD  allopurinol (ZYLOPRIM) 100 MG tablet Take 1 tablet (100 mg total) by mouth daily. 05/14/23   Ghimire, Werner Lean, MD  budesonide-formoterol (SYMBICORT) 160-4.5 MCG/ACT inhaler Inhale 2 puffs into the lungs in the morning and at bedtime.  05/14/23   Ghimire, Werner Lean, MD  carboxymethylcellulose (REFRESH PLUS) 0.5 % SOLN Place 1 drop into both eyes 3 (three) times daily as needed (dry/irritated eyes.).    [provider]  carvedilol (COREG) 6.25 MG tablet Take 1 tablet (6.25 mg total) by mouth 2 (two) times daily with a meal. 04/17/23   Joseph Art, DO  clopidogrel (PLAVIX) 75 MG tablet Take 1 tablet (75 mg total) by mouth daily. 05/14/23   Ghimire, Werner Lean, MD  diclofenac Sodium (VOLTAREN) 1 % GEL Apply 2 g topically 4 (four) times daily. 04/17/23   Joseph Art, DO  docusate sodium (COLACE) 100 MG capsule Take 1 capsule (100 mg total) by mouth daily. 05/14/23   Ghimire, Werner Lean, MD  fluticasone (FLONASE) 50 MCG/ACT nasal spray Place 1 spray into both nostrils daily. 05/14/23   Ghimire, Werner Lean, MD  insulin lispro (HUMALOG KWIKPEN) 100 UNIT/ML KwikPen 0-15 Units, Subcutaneous, 3 times daily with meal CBG < 70: Implement Hypoglycemia meausers CBG 70 - 120: 0 units CBG 121 - 150: 2 units CBG 151 - 200: 3 units CBG 201 - 250: 5 units CBG 251 - 300: 8 units CBG 301 - 350: 11 units CBG 351 - 400: 15 units CBG > 400: call MD Patient taking differently: Inject 0-15 Units into the skin 3 (three) times daily. 0-15 Units, Subcutaneous, 3 times daily with meal CBG < 70: Implement Hypoglycemia meausers CBG 70 - 120: 0 units CBG 121 -  150: 2 units CBG 151 - 200: 3 units CBG 201 - 250: 5 units CBG 251 - 300: 8 units CBG 301 - 350: 11 units CBG 351 - 400: 15 units CBG > 400: call MD 05/14/23   Maretta Bees, MD  levothyroxine (SYNTHROID) 175 MCG tablet Take 1 tablet (175 mcg total) by mouth daily. 05/14/23   Ghimire, Werner Lean, MD  lidocaine 4 % Place 2 patches onto the skin daily. Each lower side of back. 05/14/23   Ghimire, Werner Lean, MD  lidocaine-prilocaine (EMLA) cream Apply 1 Application topically See admin instructions. On dialysis days (Tues,Thurs,Sat) 02/13/22   [provider]  meclizine (ANTIVERT) 25 MG tablet Take 25 mg  by mouth daily as needed. 06/12/23   [provider]  Methoxy PEG-Epoetin Beta (MIRCERA IJ) Inject 1 Syringe into the skin every 14 (fourteen) days. 06/01/23 05/30/24  [provider]  Multiple Vitamins-Iron (MULTIVITAMINS WITH IRON) TABS tablet Take 1 tablet by mouth in the morning. 05/14/23   Ghimire, Werner Lean, MD  nitroGLYCERIN (NITROSTAT) 0.4 MG SL tablet Place 1 tablet (0.4 mg total) under the tongue every 5 (five) minutes as needed for chest pain. 07/06/23 07/05/24  Cyndi Bender, NP  Nutritional Supplements (FEEDING SUPPLEMENT, NEPRO CARB STEADY,) LIQD Take 237 mLs by mouth 2 (two) times daily between meals. 04/17/23   Joseph Art, DO  OXYGEN Inhale into the lungs.    [provider]  pantoprazole (PROTONIX) 40 MG tablet Take 1 tablet (40 mg total) by mouth 2 (two) times daily before a meal. 05/14/23   Ghimire, Werner Lean, MD  pregabalin (LYRICA) 25 MG capsule Take 25 mg by mouth daily. 05/29/23   [provider]  rosuvastatin (CRESTOR) 20 MG tablet Take 1 tablet (20 mg total) by mouth daily. 07/06/23   Cyndi Bender, NP  sevelamer carbonate (RENVELA) 800 MG tablet Take 2 tablets (1,600 mg total) by mouth 3 (three) times daily with meals. 05/14/23   Ghimire, Werner Lean, MD  thiamine (VITAMIN B1) 100 MG tablet Take 1 tablet (100 mg total) by mouth in the morning. 05/14/23   Ghimire, Werner Lean, MD  umeclidinium bromide (INCRUSE ELLIPTA) 62.5 MCG/ACT AEPB Inhale 1 puff into the lungs daily. 05/14/23   Ghimire, Werner Lean, MD      Allergies    Nebivolol, Zestril [lisinopril], Ace inhibitors, and Morphine and codeine    Review of Systems   Review of Systems  Physical Exam Updated Vital Signs BP (!) 143/78   Pulse 93   Temp 98.3 F (36.8 C) (Oral)   Resp 15   SpO2 100%  Physical Exam Constitutional:      Comments: Patient is very somnolent.  She requires extensive stimulus to awaken.  Respirations are unlabored and even.  HENT:     Head: Normocephalic and atraumatic.      Mouth/Throat:     Mouth: Mucous membranes are moist.     Pharynx: Oropharynx is clear.  Eyes:     Extraocular Movements: Extraocular movements intact.  Cardiovascular:     Rate and Rhythm: Normal rate and regular rhythm.  Pulmonary:     Comments: Patient's breath sounds are soft.  Rare expiratory wheeze.  Breath sounds sound symmetric.  No significant crackle. Abdominal:     General: There is no distension.     Palpations: Abdomen is soft.     Tenderness: There is no abdominal tenderness. There is no guarding.  Musculoskeletal:     Comments: Patient is wearing compressive  hose on both lower legs.  She has some edema but is controlled.  Skin:    General: Skin is warm and dry.  Neurological:     Comments: Patient required significant amount of tactile stimulus and vocal stimulus to awaken.  Once awake and she is answering some simple questions and following simple commands.  She is answering questions that are situationally appropriate.  She did try to open her phone with the code but could not complete the task.  She was able to hold the phone and attempt to enter numbers but was not successful.  She follows commands to cough and do grip strength.     ED Results / Procedures / Treatments   Labs (all labs ordered are listed, but only abnormal results are displayed) Labs Reviewed  CBC WITH DIFFERENTIAL/PLATELET - Abnormal; Notable for the following components:      Result Value   WBC 10.8 (*)    RBC 3.74 (*)    Hemoglobin 11.8 (*)    MCV 112.6 (*)    MCHC 28.0 (*)    nRBC 0.6 (*)    All other components within normal limits  I-STAT CHEM 8, ED - Abnormal; Notable for the following components:   BUN 33 (*)    Creatinine, Ser 5.10 (*)    Glucose, Bld 144 (*)    Calcium, Ion 1.14 (*)    TCO2 34 (*)    All other components within normal limits  CBG MONITORING, ED - Abnormal; Notable for the following components:   Glucose-Capillary 142 (*)    All other components within normal  limits  I-STAT VENOUS BLOOD GAS, ED - Abnormal; Notable for the following components:   pH, Ven 7.169 (*)    pCO2, Ven 97.6 (*)    pO2, Ven 54 (*)    Bicarbonate 35.5 (*)    TCO2 38 (*)    Acid-Base Excess 3.0 (*)    Calcium, Ion 1.13 (*)    All other components within normal limits  COMPREHENSIVE METABOLIC PANEL  ETHANOL  I-STAT CG4 LACTIC ACID, ED  TROPONIN I (HIGH SENSITIVITY)    EKG EKG Interpretation Date/Time:  Thursday July 11 2023 13:34:27 EDT Ventricular Rate:  96 PR Interval:  130 QRS Duration:  146 QT Interval:  394 QTC Calculation: 498 R Axis:   -42  Text Interpretation: Sinus rhythm Left bundle branch block voltage change I and AvL. suspect lead placement. no other sig change from previous Confirmed by Arby Barrette [phone]) on 07/11/2023 2:03:37 PM  Radiology No results found.  Procedures Procedures   CRITICAL CARE Performed by: Arby Barrette   Total critical care time: 45 minutes  Critical care time was exclusive of separately billable procedures and treating other patients.  Critical care was necessary to treat or prevent imminent or life-threatening deterioration.  Critical care was time spent personally by me on the following activities: development of treatment plan with patient and/or surrogate as well as nursing, discussions with consultants, evaluation of patient's response to treatment, examination of patient, obtaining history from patient or surrogate, ordering and performing treatments and interventions, ordering and review of laboratory studies, ordering and review of radiographic studies, pulse oximetry and re-evaluation of patient's condition.  Medications Ordered in ED Medications  naloxone (NARCAN) nasal spray 4 mg/0.1 mL (has no administration in time range)  ipratropium-albuterol (DUONEB) 0.5-2.5 (3) MG/3ML nebulizer solution 3 mL (has no administration in time range)  naloxone [facility]) injection 0.4 mg (0.4 mg Intravenous Given  07/11/23 1322)  ED Course/ Medical Decision Making/ A&P                                 Medical Decision Making Amount and/or Complexity of Data Reviewed Labs: ordered. Radiology: ordered.  Risk Prescription drug management. Decision regarding hospitalization.   Consult: Reviewed with Dr. Allena Katz [facility] hospitalist.  Given patient's significant hypercapnia and on BiPAP will consult intensivist.  Consult: Reviewed with [facility] intensivist team.  Will assess in the ED.   15: 15 patient has been off BiPAP since coming back from CT scan.  Her mental status is much more awake and alert.  However she is requiring 6 L to maintain oxygen saturation in the low to upper 80s.  Depending on patient's activity sometimes this is correlating in the lower 80s and then we will go back up to about 90%.  Given that patient's hypercapnia on oxygen when she was sleeping, goal will be about 90% and continue to watch closely.  Patient is coughing up a lot of clear sputum.  She continues to report that she does not think there is anything wrong, but at this time I suspect she is also significantly volume overloaded with chest x-ray showing diffuse patchy congestion and severe cardiomegaly.  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  Patient is critically ill with severe comorbid conditions.  Her level of consciousness is waxing and waning.  With pCO2 in the high 90s, significant risk of obtundation.  Patient also shows signs of being volume overloaded with copious production of clear respiratory secretions.  Consult: Reviewed with Dr. Latanya Maudlin.  He will assess for dialysis in the hospital as dialysis today did not do much volume removal.    Patient's mental status had improved and she did not agreed to replace BiPAP after coming back from CT. at that time, [facility] provider recommends admission to medical service with transition to  intensivist if evolving into worsening respiratory failure.  Internal medicine teaching reconsulted for admission.         Final Clinical Impression(s) / ED Diagnoses Final diagnoses:  Acute respiratory failure with hypoxia and hypercarbia (HCC)  ESRD (end stage renal disease) on dialysis [facility])    Rx / DC Orders ED Discharge Orders     None         Arby Barrette, MD 07/12/23 1702

## 2023-07-11 NOTE — Progress Notes (Signed)
An USGPIV (ultrasound guided PIV) has been placed for short-term vasopressor infusion. A correctly placed ivWatch must be used when administering Vasopressors. Should this treatment be needed beyond 24 hours, central line access should be obtained.  It will be the responsibility of the bedside nurse to follow best practice to prevent extravasations.

## 2023-07-11 NOTE — Consult Note (Signed)
Renal Service Consult Note [facility]  [name] [name] 07/11/2023 [name], MD Requesting Physician: Dr. Tonia Brooms  Reason for Consult: ESRD pt w/ AMS and resp distress HPI: The patient is a 72 y.o. year-old w/ PMH as below who was sent to ED after completing her dialysis session today, due to lethargy.  Wears 3L O2 at home. Pt is DNR. In ED BP's soft and they couldn't keep her sats up w/ [place] at 6 L and had to be put on bipap. Pt coughing clear sputum. CXR showed probable congestion +/- edema. ABG also showed severe hypercapnia. Pt admitted to ICU. We are asked to see for dialysis.   Pt seen in ED. Ox 3. On [place] O2 at this time. Denies any CP or abd pain. Son is in the room.   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   Full dentures    GERD (gastroesophageal reflux disease)    Gout    History of hiatal hernia    History of transfusion    Hx MRSA infection    abscess left groin   Hyperlipidemia    Hypertension    Hypothyroid    Intractable nausea and vomiting 02/15/2021   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HCC)    Peripheral vascular disease (HCC)    Sleep apnea    UNABLE TO TOLERATE C PAP   Stress incontinence    TIA (transient ischemic attack)     X2 NO RESIDUAL PROBLEMS   Wears glasses    Past Surgical History  Past Surgical History:  Procedure Laterality Date   ABDOMINAL HYSTERECTOMY     AV FISTULA PLACEMENT Left 12/01/2021   Procedure: INSERTION OF LEFT  ARM ARTERIOVENOUS (AV) GORE-TEX GRAFT;  Surgeon: Nada Libman, MD;  Location: [facility] OR;  Service: Vascular;  Laterality: Left;   BACK SURGERY  2020   BASCILIC VEIN TRANSPOSITION Left 04/05/2020   Procedure: BASILIC VEIN TRANSPOSITION FIRST STAGE;  Surgeon: Chuck Hint, MD;  Location: [facility] OR;  Service: Vascular;  Laterality: Left;   BRONCHIAL BIOPSY  01/23/2022   Procedure: BRONCHIAL BIOPSIES;  Surgeon: Josephine Igo, DO;  Location: [facility] ENDOSCOPY;  Service: Pulmonary;;   BRONCHIAL BIOPSY  08/07/2022   Procedure: BRONCHIAL BIOPSIES;  Surgeon: Josephine Igo, DO;  Location: [facility];  Service: Pulmonary;;   BRONCHIAL BRUSHINGS  08/07/2022   Procedure: BRONCHIAL BRUSHINGS;  Surgeon: Josephine Igo, DO;  [facility]: [facility];  Service: Pulmonary;;   BRONCHIAL NEEDLE ASPIRATION BIOPSY  01/23/2022   Procedure: BRONCHIAL NEEDLE ASPIRATION BIOPSIES;  Surgeon: Josephine Igo, DO;  [facility]: [facility];  Service: Pulmonary;;   BRONCHIAL NEEDLE ASPIRATION BIOPSY  08/07/2022   Procedure: BRONCHIAL NEEDLE ASPIRATION BIOPSIES;  Surgeon: Josephine Igo, DO;  [facility]: [facility];  Service: Pulmonary;;   COLON SURGERY  1995   DUE TO POLYP   DILATION AND CURETTAGE OF  UTERUS     ENDOBRONCHIAL ULTRASOUND  01/23/2022   Procedure: ENDOBRONCHIAL ULTRASOUND;  Surgeon: Josephine Igo, DO;  Location: [facility];  Service: Pulmonary;;   ENTEROSCOPY N/A 07/10/2021   Procedure: ENTEROSCOPY;  Surgeon: Kerin Salen, MD;  Location[facility]COPY;  Service: Gastroenterology;  Laterality: N/A;   ESOPHAGOGASTRODUODENOSCOPY N/A 01/25/2021   Procedure: ESOPHAGOGASTRODUODENOSCOPY (EGD);  Surgeon: Vida Rigger, MD;  Location: [facility] ENDOSCOPY;  Service: Endoscopy;  Laterality: N/A;   FINE NEEDLE ASPIRATION  01/23/2022   Procedure: FINE NEEDLE ASPIRATION (FNA) LINEAR;  Surgeon: Josephine Igo, DO;  [facility];  Service: Pulmonary;;   FOREARM FRACTURE SURGERY     Left arm   GIVENS CAPSULE STUDY  N/A 07/07/2021   Procedure: GIVENS CAPSULE STUDY;  Surgeon: Vida Rigger, MD;  Location: [facility] ENDOSCOPY;  Service: Endoscopy;  Laterality: N/A;   HERNIA REPAIR     w/ mesh   HOT HEMOSTASIS N/A 07/10/2021   Procedure: HOT HEMOSTASIS (ARGON PLASMA COAGULATION/BICAP);  Surgeon: Kerin Salen, MD;  Location: [facility] ENDOSCOPY;  Service: Gastroenterology;  Laterality: N/A;   INCISION AND DRAINAGE ABSCESS Left 02/04/2013   Procedure: INCISION AND DRAINAGE LEFT BUTTOCK ABSCESS; INCISION AND DRAINAGE LEFT BREAST ABSCESS;  Surgeon: Shelly Rubenstein, MD;  Location: [facility];  Service: General;  Laterality: Left;   INCISION AND DRAINAGE ABSCESS N/A 02/12/2013   Procedure: INCISION AND DEBRIDEMENT BUTTOCK WOUND ;  Surgeon: Wilmon Arms. Corliss Skains, MD;  Location: [facility];  Service: General;  Laterality: N/A;   INCISION AND DRAINAGE ABSCESS N/A 02/14/2013   Procedure: INCISION AND DRAINAGE/DRESSING CHANGE;  Surgeon: Shelly Rubenstein, MD;  [facility]: [facility];  Service: General;  Laterality: N/A;   INCISION AND DRAINAGE ABSCESS N/A 03/21/2015   Procedure: INCISION AND DRAINAGE PUBIC ABSCESS;  Surgeon: Glenna Fellows, MD;  Location: [facility] ORS;  Service: General;  Laterality: N/A;   INCISION AND DRAINAGE PERIRECTAL ABSCESS Left 02/10/2013   Procedure: IRRIGATION AND DEBRIDEMENT OF BUTTOCK/PERINEAL ABSCESS;  Surgeon: Wilmon Arms. Corliss Skains, MD;  [facility]: [facility];  Service: General;  Laterality: Left;   INCISION AND DRAINAGE PERIRECTAL ABSCESS N/A 02/16/2013   Procedure: IRRIGATION AND DEBRIDEMENT PERINEAL ABSCESS;  Surgeon: Liz Malady, MD;  [facility]: [facility];  Service: General;  Laterality: N/A;   IR FLUORO GUIDE CV LINE RIGHT  11/30/2021   IR US GUIDE VASC ACCESS RIGHT  11/30/2021   IRRIGATION AND DEBRIDEMENT ABSCESS N/A 02/06/2013   Procedure: IRRIGATION AND DEBRIDEMENT BUTTOCK ABSCESS AND DRESSING CHANGE;  Surgeon: Shelly Rubenstein, MD;  [facility];  Service: General;  Laterality: N/A;   IRRIGATION AND DEBRIDEMENT ABSCESS Left 02/08/2013    Procedure: IRRIGATION AND DEBRIDEMENT ABSCESS/DRESSING CHANGE;  Surgeon: Cherylynn Ridges, MD;  [facility]: [facility];  Service: General;  Laterality: Left;   JOINT REPLACEMENT  2010 / 2012    LAPAROSCOPIC CHOLECYSTECTOMY     LEFT HEART CATH AND CORONARY ANGIOGRAPHY N/A 07/05/2023   Procedure: LEFT HEART CATH AND CORONARY ANGIOGRAPHY;  Surgeon: Lennette Bihari, MD;  [facility] INVAS[facility];  Service: Cardiovascular;  Laterality: N/A;   LEFT HEART CATHETERIZATION WITH CORONARY ANGIOGRAM N/A 07/02/2014   Procedure: LEFT HEART CATHETERIZATION WITH CORONARY ANGIOGRAM;  Surgeon: Runell Gess, MD;  Location: [facility] CATH LAB;  Service: Cardiovascular;  Laterality: N/A;   LOWER EXTREMITY ANGIOGRAM Right 04/20/2016   Procedure: Lower Extremity Angiogram;  Surgeon: Sherren Kerns, MD;  Location: [facility] INVASIVE CV LAB;  Service: Cardiovascular;  Laterality: Right;   MULTIPLE TOOTH EXTRACTIONS  PERIPHERAL VASCULAR CATHETERIZATION N/A 04/20/2016   Procedure: Abdominal Aortogram;  Surgeon: Sherren Kerns, MD;  Location: [facility] INVASIVE CV LAB;  Service: Cardiovascular;  Laterality: N/A;   PERIPHERAL VASCULAR CATHETERIZATION Right 04/20/2016   Procedure: Peripheral Vascular Intervention;  Surgeon: Sherren Kerns, MD;  Location: [facility] INVASIVE CV LAB;  Service: Cardiovascular;  Laterality: Right;  popiteal   RIGHT HEART CATH N/A 10/27/2018   Procedure: RIGHT HEART CATH;  Surgeon: Laurey Morale, MD;  Location: [facility] INVASIVE CV LAB;  Service: Cardiovascular;  Laterality: N/A;   RIGHT HEART CATH N/A 03/20/2019   Procedure: RIGHT HEART CATH;  Surgeon: Laurey Morale, MD;  Location: [facility] INVASIVE CV LAB;  Service: Cardiovascular;  Laterality: N/A;   THYROIDECTOMY     TOTAL HIP REVISION Right 08/11/2014   Procedure: RIGHT ACETABULAR REVISION;  Surgeon: Loanne Drilling, MD;  Location: [facility] ORS;  Service: Orthopedics;  Laterality: Right;   VASCULAR SURGERY     VIDEO BRONCHOSCOPY WITH ENDOBRONCHIAL ULTRASOUND Bilateral 08/07/2022    Procedure: VIDEO BRONCHOSCOPY WITH ENDOBRONCHIAL ULTRASOUND;  Surgeon: Josephine Igo, DO;  Location: [facility];  Service: Pulmonary;  Laterality: Bilateral;   VIDEO BRONCHOSCOPY WITH RADIAL ENDOBRONCHIAL ULTRASOUND  01/23/2022   Procedure: RADIAL ENDOBRONCHIAL ULTRASOUND;  Surgeon: Josephine Igo, DO;  Location: [facility] ENDOS[facility];  Service: Pulmonary;;   Family History  Family History  Problem Relation Age of Onset   Diabetes Brother    Cardiomyopathy Mother    Heart disease Mother    Cancer Father    Social History  reports that she quit smoking about 21 months ago. Her smoking use included cigarettes. She started smoking about 41 years ago. She has a 10 pack-year smoking history. She has been exposed to tobacco smoke. She has quit using smokeless tobacco. She reports current alcohol use of about 1.0 standard drink of alcohol per week. She reports that she does not use drugs. Allergies  Allergies  Allergen Reactions   Nebivolol Swelling    Chest pain (reaction to Bystolic)   Zestril [Lisinopril] Swelling and Other (See Comments)   Ace Inhibitors Swelling and Other (See Comments)    Tongue swell   Morphine And Codeine Itching   Home medications Prior to Admission medications   Medication Sig Start Date End Date Taking? Authorizing Provider  acetaminophen (TYLENOL) 500 MG tablet Take 1 tablet (500 mg total) by mouth See admin instructions. Take 1,000mg  (2 tablets) by mouth twice a day in addition to 1,000mg  (2 tablets) twice daily as needed for pain. 05/14/23   Ghimire, Werner Lean, MD  albuterol (PROVENTIL) (2.5 MG/3ML) 0.083% nebulizer solution Take 3 mLs (2.5 mg total) by nebulization every 6 (six) hours as needed for wheezing or shortness of breath. 05/14/23   Ghimire, Werner Lean, MD  albuterol (VENTOLIN HFA) 108 (90 Base) MCG/ACT inhaler Inhale 2 puffs into the lungs every 6 (six) hours as needed for shortness of breath (COPD). 05/14/23   Ghimire, Werner Lean, MD  allopurinol (ZYLOPRIM) 100  MG tablet Take 1 tablet (100 mg total) by mouth daily. 05/14/23   Ghimire, Werner Lean, MD  budesonide-formoterol (SYMBICORT) 160-4.5 MCG/ACT inhaler Inhale 2 puffs into the lungs in the morning and at bedtime. 05/14/23   Ghimire, Werner Lean, MD  carboxymethylcellulose (REFRESH PLUS) 0.5 % SOLN Place 1 drop into both eyes 3 (three) times daily as needed (dry/irritated eyes.).    [provider]  carvedilol (COREG) 6.25 MG tablet Take 1 tablet (6.25 mg total) by mouth 2 (two) times daily  with a meal. 04/17/23   Joseph Art, DO  clopidogrel (PLAVIX) 75 MG tablet Take 1 tablet (75 mg total) by mouth daily. 05/14/23   Ghimire, Werner Lean, MD  diclofenac Sodium (VOLTAREN) 1 % GEL Apply 2 g topically 4 (four) times daily. 04/17/23   Joseph Art, DO  docusate sodium (COLACE) 100 MG capsule Take 1 capsule (100 mg total) by mouth daily. 05/14/23   Ghimire, Werner Lean, MD  fluticasone (FLONASE) 50 MCG/ACT nasal spray Place 1 spray into both nostrils daily. 05/14/23   Ghimire, Werner Lean, MD  insulin lispro (HUMALOG KWIKPEN) 100 UNIT/ML KwikPen 0-15 Units, Subcutaneous, 3 times daily with meal CBG < 70: Implement Hypoglycemia meausers CBG 70 - 120: 0 units CBG 121 - 150: 2 units CBG 151 - 200: 3 units CBG 201 - 250: 5 units CBG 251 - 300: 8 units CBG 301 - 350: 11 units CBG 351 - 400: 15 units CBG > 400: call MD Patient taking differently: Inject 0-15 Units into the skin 3 (three) times daily. 0-15 Units, Subcutaneous, 3 times daily with meal CBG < 70: Implement Hypoglycemia meausers CBG 70 - 120: 0 units CBG 121 - 150: 2 units CBG 151 - 200: 3 units CBG 201 - 250: 5 units CBG 251 - 300: 8 units CBG 301 - 350: 11 units CBG 351 - 400: 15 units CBG > 400: call MD 05/14/23   Maretta Bees, MD  levothyroxine (SYNTHROID) 175 MCG tablet Take 1 tablet (175 mcg total) by mouth daily. 05/14/23   Ghimire, Werner Lean, MD  lidocaine 4 % Place 2 patches onto the skin daily. Each lower side of back. 05/14/23   Ghimire, Werner Lean,  MD  lidocaine-prilocaine (EMLA) cream Apply 1 Application topically See admin instructions. On dialysis days (Tues,Thurs,Sat) 02/13/22   [provider]  meclizine (ANTIVERT) 25 MG tablet Take 25 mg by mouth daily as needed. 06/12/23   [provider]  Methoxy PEG-Epoetin Beta (MIRCERA IJ) Inject 1 Syringe into the skin every 14 (fourteen) days. 06/01/23 05/30/24  [provider]  Multiple Vitamins-Iron (MULTIVITAMINS WITH IRON) TABS tablet Take 1 tablet by mouth in the morning. 05/14/23   Ghimire, Werner Lean, MD  nitroGLYCERIN (NITROSTAT) 0.4 MG SL tablet Place 1 tablet (0.4 mg total) under the tongue every 5 (five) minutes as needed for chest pain. 07/06/23 07/05/24  Cyndi Bender, NP  Nutritional Supplements (FEEDING SUPPLEMENT, NEPRO CARB STEADY,) LIQD Take 237 mLs by mouth 2 (two) times daily between meals. 04/17/23   Joseph Art, DO  OXYGEN Inhale into the lungs.    [provider]  pantoprazole (PROTONIX) 40 MG tablet Take 1 tablet (40 mg total) by mouth 2 (two) times daily before a meal. 05/14/23   Ghimire, Werner Lean, MD  pregabalin (LYRICA) 25 MG capsule Take 25 mg by mouth daily. 05/29/23   [provider]  rosuvastatin (CRESTOR) 20 MG tablet Take 1 tablet (20 mg total) by mouth daily. 07/06/23   Cyndi Bender, NP  sevelamer carbonate (RENVELA) 800 MG tablet Take 2 tablets (1,600 mg total) by mouth 3 (three) times daily with meals. 05/14/23   Ghimire, Werner Lean, MD  thiamine (VITAMIN B1) 100 MG tablet Take 1 tablet (100 mg total) by mouth in the morning. 05/14/23   Ghimire, Werner Lean, MD  umeclidinium bromide (INCRUSE ELLIPTA) 62.5 MCG/ACT AEPB Inhale 1 puff into the lungs daily. 05/14/23   Maretta Bees, MD     Vitals:   07/11/23  1503 07/11/23 1508 07/11/23 1620 07/11/23 1630  BP:   (!) 80/50 (!) 105/39  Pulse:  (!) 112 95 96  Resp:  (!) 23 18 12   Temp:      TempSrc:      SpO2: 91% 93% 100% 100%   Exam Gen alert, [place] O2, obese, not distress No rash,  cyanosis or gangrene Sclera anicteric, throat clear  No jvd or bruits Chest rales L base 1/2 up, R clear  RRR no MRG Abd soft ntnd no mass or ascites +bs GU defer MS no joint effusions or deformity Ext 1-2+ bilat pretib edema Neuro is alert, Ox 3 , nf    LUA AVG+bruit      Renal-related home meds: - coreg 6.25 bid - home O2  - pregabalin 25 every day - renvela 2 ac tid     OP HD: [facility] 4h   400/600  99kg  2/2 bath   aVG   Heparin 2000+ 2000 midrun - last HD 10/3 today, post wt 106kg - 3wks ago coming off 3 over, this last week coming off 7kg up - hectorol 3 mcg  - venofer 100mg  three times per week, thru 10/05 - mircera 50 mcg IV q 2 wks, last 10/3, due 10/17   Assessment/ Plan: ESRD - on HD TTS. Had HD earlier today. Will plan for additional HD this evening in ICU for volume overload.  Volume overload - at the end of outpt HD today pt was 6-7kg over dry wt. CXR c/w pulm edema. HD as above.  BP - takes coreg at home. BP's soft here, on levophed gtt at this time. 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 No esa needs at this time.  MBD ckd - CCa in range, will add on phos.  DNR      [name]  MD [facility] 07/11/2023, 4:43 PM  Recent Labs  Lab 07/06/23 0444 07/11/23 1321 07/11/23 1333 07/11/23 1334  HGB 10.4* 11.8* 14.3 13.9  ALBUMIN  --  3.6  --   --   CALCIUM 8.6* 9.0  --   --   CREATININE 7.67* 4.92* 5.10*  --   K 5.4* 4.8 4.7 4.7   Inpatient medications:  arformoterol  15 mcg Nebulization Q12H   budesonide (PULMICORT) nebulizer solution  0.5 mg Nebulization BID   heparin  5,000 Units Subcutaneous Q8H   ipratropium-albuterol  3 mL Nebulization Q4H   naloxone  1 spray Nasal Once   revefenacin  175 mcg Nebulization Daily   sodium bicarbonate  100 mEq Intravenous Once    sodium chloride     albumin human     norepinephrine (LEVOPHED) Adult infusion 2 mcg/min (07/11/23 1633)   docusate sodium, ipratropium-albuterol, polyethylene glycol

## 2023-07-11 NOTE — ED Triage Notes (Signed)
Pt BIB [facility] from dialysis center with reports of AMS. Per EMS she was lethargic and slow to respond. VSS. Note from dialysis reports AMS since Saturday, 9/28 with decreased appetite. Aox4 at triage.

## 2023-07-11 NOTE — Progress Notes (Signed)
Patient ripped her Bi-pap off. When RNs went into the room, patient was swinging at staff when staff went to reconnect her Bi-Pap tubing to the mask. Patient was able to answer mentation questions, but is still refusing to put the Bi-Pap back on, even after education. MD notified.

## 2023-07-11 NOTE — Progress Notes (Signed)
Patient arrived to ICU, lethargic. Answers questions appropriately but falls asleep while talking. Keep NPO at this time. Patients son at bedside POC explained to him.

## 2023-07-11 NOTE — ED Notes (Signed)
Pt refused any treatment including IV start, stating she wants to go home. EDP notified.

## 2023-07-11 NOTE — Progress Notes (Signed)
RT went to give pt a breathing treatment and draw an ABG but pt refused both at this time.

## 2023-07-11 NOTE — H&P (Signed)
NAME:  [name] [name], MRN:  [id number], DOB:  1951/01/21, LOS: 0 ADMISSION DATE:  07/11/2023, CONSULTATION DATE:  07/11/2023 REFERRING MD:  Johny Blamer, IM, CHIEF COMPLAINT:  shortness of breath    History of Present Illness:  72 year old female with past medical history of TIA, HTN, HLD, GERD, DM, COPD on 3LNC, obesity, ESRD on HD TThS, recent catheterization who presents to the emergency department after dialysis for altered mental status. Per EDP, patient completed dialysis today. She was drowsy at completion of treatment and was sent to ED. On arrival, patient was obtunded, requiring significant tactile stimulation to awaken. Patient was placed on BiPAP which improved her mental status. ABG and CXR obtained showing fluid overload, and significant acidosis with pH 7.169, pCO2 97.6, pO2 54. After about 30 minutes of BiPAP treatment patient took herself off, refusing to wear it again. She was noted to be coughing up clear sputum. Hospitalist was consulted and subsequently PCCM consulted for severe acidosis. Labs in ED notable for WBC 10.8, BUN 33, Cr 5.10, pH as above, troponin 90, lactic negative. CXR with pulmonary congestion, CT head and CT chest are pending at time of evaluation.   On my exam, the patient is awake, alert, asking to go home or eat. She is not very sure why she needs admission or further BiPAP at this time. She denies feeling sick in the past few days. She does endorse being compliant on her dialysis. Notes she wears 3LNC which is her baseline and has not had to increase recently. She endorses compliance with medications. She denies new cough or fever, n/v/d.   After exam, PCCM was re-consulted as patient became obtunded again, hypotensive requiring peripheral levo and was admitted to [facility].   Pertinent  Medical History  TIA, HTN, HLD, GERD, DM, COPD on 3LNC, obesity, ESRD on HD TThS  Significant Hospital Events: Including procedures, antibiotic start and stop dates in addition to  other pertinent events   10/3: admitted to PCCM   Interim History / Subjective:  Denies pain, sob, cp, cough, fever. States she would like to eat.   Objective   Blood pressure (!) 105/39, pulse 96, temperature 98.3 F (36.8 C), temperature source Oral, resp. rate 12, SpO2 100%.    FiO2 (%):  [30 %-50 %] 50 %  No intake or output data in the 24 hours ending 07/11/23 1635 There were no vitals filed for this visit.  Examination: General: obese female, sitting in bed. Mildly tachypneic on 5LNC  HENT: EOMI, mucous membranes moist, anicteric sclera  Lungs: decreased air movement bilaterally, rales diffusely. No wheezing  Cardiovascular: s1/s2 with systolic murmur. No rub or gallop Abdomen: round, soft, nontender  Extremities: mild bilateral lower extremity pitting edema  Neuro: alert, oriented  GU: deferred   Resolved Hospital Problem list     Assessment & Plan:  Acute on chronic hypercarbic, hypoxic respiratory failure 2/2 fluid volume overload vs COPD exacerbation  COPD on 3LNC  Initial VBG with significant acidosis. pCO2 90s. Clinically, does not appear to be obtunded at this time. She is awake and alert/oriented. Did have about 30 minutes of BiPAP. Likely multifactorial related to CHF/fluid volume overload. She has underlying COPD and exacerbation could be contributing to hypercarbia.   Plan: - ABG now, obtain BNP  - ideally, BiPAP continuously until pCO2 normalizes and then wean to baseline 3LNC  - EDP consult nephro - appreciate recs and think she would benefit from further dialysis for volume overload - Ordered brovana, pulmicort, yupelri, scheduled  and PRN duonebs  - Aggressive pulm toileting with flutter valve, IS  - continuous pulse ox monitoring   Shock; hypotension likely related to respiratory acidosis. She is not hypovolemic. Exam and history inconsistent with obstructive pathology or neuro. No leukocytosis or reported fevers to concern for sepsis at this time.  - f/u  procalcitonin - 1 amp Bicarb now  - strict I&O - levophed to maintain MAP >65  ESRD on HD TThS - received dialysis today  Plan: - EDP consulted nephrology for further dialysis given volume overload. Appreciate recs - trend BMP, lytes   DM - CBG checks  - SSI coverage  - NPO for now   HTN HLD CAD CHF; recent left heart cath with LMCx 40%, LAD 10%  - hold carvedilol, nitroglycerin, crestor while hypotensive, NPO  Hypothyroidism  - resume synthroid when able to take PO  - f/u TSH, free T4 now   Best Practice (right click and "Reselect all SmartList Selections" daily)  Per primary team  Diet/type: NPO DVT prophylaxis: subQ heparin GI prophylaxis: n/a Lines: n/a Foley:  n/a Code Status:  full code  Last date of multidisciplinary goals of care discussion [discussed plan of care at bedside with patient]  Labs   CBC: Recent Labs  Lab 07/06/23 0444 07/11/23 1321 07/11/23 1333 07/11/23 1334  WBC 7.5 10.8*  --   --   NEUTROABS  --  9.0*  --   --   HGB 10.4* 11.8* 14.3 13.9  HCT 34.7* 42.1 42.0 41.0  MCV 106.8* 112.6*  --   --   PLT 174 237  --   --     Basic Metabolic Panel: Recent Labs  Lab 07/06/23 0444 07/11/23 1321 07/11/23 1333 07/11/23 1334  NA 134* 138 139 137  K 5.4* 4.8 4.7 4.7  CL 96* 95* 99  --   CO2 26 31  --   --   GLUCOSE 144* 145* 144*  --   BUN 64* 28* 33*  --   CREATININE 7.67* 4.92* 5.10*  --   CALCIUM 8.6* 9.0  --   --    GFR: Estimated Creatinine Clearance: 11.9 mL/min (A) (by C-G formula based on SCr of 5.1 mg/dL (H)). Recent Labs  Lab 07/06/23 0444 07/11/23 1321 07/11/23 1334  WBC 7.5 10.8*  --   LATICACIDVEN  --   --  0.9    Liver Function Tests: Recent Labs  Lab 07/11/23 1321  AST 23  ALT 13  ALKPHOS 81  BILITOT 0.4  PROT 7.4  ALBUMIN 3.6   No results for input(s): "LIPASE", "AMYLASE" in the last 168 hours. No results for input(s): "AMMONIA" in the last 168 hours.  ABG    Component Value Date/Time   PHART  7.278 (L) 08/07/2022 2321   PCO2ART 72.3 (HH) 08/07/2022 2321   PO2ART 55 (L) 08/07/2022 2321   HCO3 35.5 (H) 07/11/2023 1334   TCO2 38 (H) 07/11/2023 1334   O2SAT 75 07/11/2023 1334     Coagulation Profile: No results for input(s): "INR", "PROTIME" in the last 168 hours.  Cardiac Enzymes: No results for input(s): "CKTOTAL", "CKMB", "CKMBINDEX", "TROPONINI" in the last 168 hours.  HbA1C: Hgb A1c MFr Bld  Date/Time Value Ref Range Status  04/13/2023 06:58 PM 7.1 (H) 4.8 - 5.6 % Final    Comment:    (NOTE) Pre diabetes:          5.7%-6.4%  Diabetes:              >  6.4%  Glycemic control for   <7.0% adults with diabetes   08/08/2022 01:30 AM 7.0 (H) 4.8 - 5.6 % Final    Comment:    (NOTE) Pre diabetes:          5.7%-6.4%  Diabetes:              >6.4%  Glycemic control for   <7.0% adults with diabetes     CBG: Recent Labs  Lab 07/05/23 1021 07/05/23 1316 07/05/23 1548 07/11/23 1318  GLUCAP 99 100* 169* 142*    Review of Systems:   As per HPI  Past Medical History:  She,  has a past medical history of Anemia, Arthritis, Asthma, Chronic diastolic (congestive) heart failure (HCC), Chronic kidney disease, Chronic pain syndrome, Class 2 obesity due to excess calories with body mass index (BMI) of 36.0 to 36.9 in adult, COPD (chronic obstructive pulmonary disease) (HCC), Diabetes mellitus without complication (HCC), Full dentures, GERD (gastroesophageal reflux disease), Gout, History of hiatal hernia, History of transfusion, MRSA infection, Hyperlipidemia, Hypertension, Hypothyroid, Intractable nausea and vomiting (02/15/2021), 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HCC), Peripheral vascular disease (HCC), Sleep apnea, Stress incontinence, TIA (transient ischemic attack), and Wears glasses.   Surgical History:   Past Surgical History:  Procedure Laterality Date   ABDOMINAL HYSTERECTOMY     AV FISTULA PLACEMENT Left 12/01/2021   Procedure: INSERTION OF LEFT ARM ARTERIOVENOUS  (AV) GORE-TEX GRAFT;  Surgeon: Nada Libman, MD;  Location: [facility] OR;  Service: Vascular;  Laterality: Left;   BACK SURGERY  2020   BASCILIC VEIN TRANSPOSITION Left 04/05/2020   Procedure: BASILIC VEIN TRANSPOSITION FIRST STAGE;  Surgeon: Chuck Hint, MD;  Location[facility];  Service: Vascular;  Laterality: Left;   BRONCHIAL BIOPSY  01/23/2022   Procedure: BRONCHIAL BIOPSIES;  Surgeon: Josephine Igo, DO;  Location: [facility] ENDOSCOPY;  Service: Pulmonary;;   BRONCHIAL BIOPSY  08/07/2022   Procedure: BRONCHIAL BIOPSIES;  Surgeon: Josephine Igo, DO;  Location: [facility] ENDOSCOPY;  Service: Pulmonary;;   BRONCHIAL BRUSHINGS  08/07/2022   Procedure: BRONCHIAL BRUSHINGS;  Surgeon: Josephine Igo, DO;  Location: [facility] ENDOSCOPY;  Service: Pulmonary;;   BRONCHIAL NEEDLE ASPIRATION BIOPSY  01/23/2022   Procedure: BRONCHIAL NEEDLE ASPIRATION BIOPSIES;  Surgeon: Josephine Igo, DO;  Location: [facility] ENDOSCOPY;  Service: Pulmonary;;   BRONCHIAL NEEDLE ASPIRATION BIOPSY  08/07/2022   Procedure: BRONCHIAL NEEDLE ASPIRATION BIOPSIES;  Surgeon: Josephine Igo, DO;  Location: [facility] ENDOSCOPY;  Service: Pulmonary;;   COLON SURGERY  1995   DUE TO POLYP   DILATION AND CURETTAGE OF UTERUS     ENDOBRONCHIAL ULTRASOUND  01/23/2022   Procedure: ENDOBRONCHIAL ULTRASOUND;  Surgeon: Josephine Igo, DO;  Location: [facility] ENDOS[facility];  Service: Pulmonary;;   ENTEROSCOPY N/A 07/10/2021   Procedure: ENTEROSCOPY;  Surgeon: Kerin Salen, MD;  Location: [facility] ENDOSCOPY;  Service: Gastroenterology;  Laterality: N/A;   ESOPHAGOGASTRODUODENOSCOPY N/A 01/25/2021   Procedure: ESOPHAGOGASTRODUODENOSCOPY (EGD);  Surgeon: Vida Rigger, MD;  Location: [facility] ENDOSCOPY;  Service: Endoscopy;  Laterality: N/A;   FINE NEEDLE ASPIRATION  01/23/2022   Procedure: FINE NEEDLE ASPIRATION (FNA) LINEAR;  Surgeon: Josephine Igo, DO;  Location: [facility]OS[facility];  Service: Pulmonary;;   FOREARM FRACTURE SURGERY     Left arm   GIVENS CAPSULE STUDY N/A 07/07/2021    Procedure: GIVENS CAPSULE STUDY;  Surgeon: Vida Rigger, MD;  Location: [facility] ENDOSCOPY;  Service: Endoscopy;  Laterality: N/A;   HERNIA REPAIR     w/ mesh   HOT HEMOSTASIS N/A 07/10/2021  Procedure: HOT HEMOSTASIS (ARGON PLASMA COAGULATION/BICAP);  Surgeon: Kerin Salen, MD;  Location: [facility] ENDOSCOPY;  Service: Gastroenterology;  Laterality: N/A;   INCISION AND DRAINAGE ABSCESS Left 02/04/2013   Procedure: INCISION AND DRAINAGE LEFT BUTTOCK ABSCESS; INCISION AND DRAINAGE LEFT BREAST ABSCESS;  Surgeon: Shelly Rubenstein, MD;  Location: [facility] OR;  Service: General;  Laterality: Left;   INCISION AND DRAINAGE ABSCESS N/A 02/12/2013   Procedure: INCISION AND DEBRIDEMENT BUTTOCK WOUND ;  Surgeon: Wilmon Arms. Corliss Skains, MD;  Location: [facility] OR;  Service: General;  Laterality: N/A;   INCISION AND DRAINAGE ABSCESS N/A 02/14/2013   Procedure: INCISION AND DRAINAGE/DRESSING CHANGE;  Surgeon: Shelly Rubenstein, MD;  Location: [facility] OR;  Service: General;  Laterality: N/A;   INCISION AND DRAINAGE ABSCESS N/A 03/21/2015   Procedure: INCISION AND DRAINAGE PUBIC ABSCESS;  Surgeon: Glenna Fellows, MD;  Location: [facility] ORS;  Service: General;  Laterality: N/A;   INCISION AND DRAINAGE PERIRECTAL ABSCESS Left 02/10/2013   Procedure: IRRIGATION AND DEBRIDEMENT OF BUTTOCK/PERINEAL ABSCESS;  Surgeon: Wilmon Arms. Corliss Skains, MD;  Location: [facility] OR;  Service: General;  Laterality: Left;   INCISION AND DRAINAGE PERIRECTAL ABSCESS N/A 02/16/2013   Procedure: IRRIGATION AND DEBRIDEMENT PERINEAL ABSCESS;  Surgeon: Liz Malady, MD;  Location: [facility] OR;  Service: General;  Laterality: N/A;   IR FLUORO GUIDE CV LINE RIGHT  11/30/2021   IR US GUIDE VASC ACCESS RIGHT  11/30/2021   IRRIGATION AND DEBRIDEMENT ABSCESS N/A 02/06/2013   Procedure: IRRIGATION AND DEBRIDEMENT BUTTOCK ABSCESS AND DRESSING CHANGE;  Surgeon: Shelly Rubenstein, MD;  Location: [facility] OR;  Service: General;  Laterality: N/A;   IRRIGATION AND DEBRIDEMENT ABSCESS Left 02/08/2013   Procedure:  IRRIGATION AND DEBRIDEMENT ABSCESS/DRESSING CHANGE;  Surgeon: Cherylynn Ridges, MD;  Location: [facility] OR;  Service: General;  Laterality: Left;   JOINT REPLACEMENT  2010 / 2012    LAPAROSCOPIC CHOLECYSTECTOMY     LEFT HEART CATH AND CORONARY ANGIOGRAPHY N/A 07/05/2023   Procedure: LEFT HEART CATH AND CORONARY ANGIOGRAPHY;  Surgeon: Lennette Bihari, MD;  Location: [facility] INVASIVE CV LAB;  Service: Cardiovascular;  Laterality: N/A;   LEFT HEART CATHETERIZATION WITH CORONARY ANGIOGRAM N/A 07/02/2014   Procedure: LEFT HEART CATHETERIZATION WITH CORONARY ANGIOGRAM;  Surgeon: Runell Gess, MD;  Location: [facility] CATH LAB;  Service: Cardiovascular;  Laterality: N/A;   LOWER EXTREMITY ANGIOGRAM Right 04/20/2016   Procedure: Lower Extremity Angiogram;  Surgeon: Sherren Kerns, MD;  Location: [facility] INVASIVE CV LAB;  Service: Cardiovascular;  Laterality: Right;   MULTIPLE TOOTH EXTRACTIONS     PERIPHERAL VASCULAR CATHETERIZATION N/A 04/20/2016   Procedure: Abdominal Aortogram;  Surgeon: Sherren Kerns, MD;  Location: [facility] INVASIVE CV LAB;  Service: Cardiovascular;  Laterality: N/A;   PERIPHERAL VASCULAR CATHETERIZATION Right 04/20/2016   Procedure: Peripheral Vascular Intervention;  Surgeon: Sherren Kerns, MD;  Location: [facility] INVASIVE CV LAB;  Service: Cardiovascular;  Laterality: Right;  popiteal   RIGHT HEART CATH N/A 10/27/2018   Procedure: RIGHT HEART CATH;  Surgeon: Laurey Morale, MD;  Location: [facility] INVASIVE CV LAB;  Service: Cardiovascular;  Laterality: N/A;   RIGHT HEART CATH N/A 03/20/2019   Procedure: RIGHT HEART CATH;  Surgeon: Laurey Morale, MD;  Location: [facility] INVASIVE CV LAB;  Service: Cardiovascular;  Laterality: N/A;   THYROIDECTOMY     TOTAL HIP REVISION Right 08/11/2014   Procedure: RIGHT ACETABULAR REVISION;  Surgeon: Loanne Drilling, MD;  Location: [facility] ORS;  Service: Orthopedics;  Laterality: Right;   VASCULAR SURGERY  VIDEO BRONCHOSCOPY WITH ENDOBRONCHIAL ULTRASOUND Bilateral 08/07/2022   Procedure:  VIDEO BRONCHOSCOPY WITH ENDOBRONCHIAL ULTRASOUND;  Surgeon: Josephine Igo, DO;  Location: [facility] ENDOSCOPY;  Service: Pulmonary;  Laterality: Bilateral;   VIDEO BRONCHOSCOPY WITH RADIAL ENDOBRONCHIAL ULTRASOUND  01/23/2022   Procedure: RADIAL ENDOBRONCHIAL ULTRASOUND;  Surgeon: Josephine Igo, DO;  Location: [facility] ENDOSCOPY;  Service: Pulmonary;;     Social History:   reports that she quit smoking about 21 months ago. Her smoking use included cigarettes. She started smoking about 41 years ago. She has a 10 pack-year smoking history. She has been exposed to tobacco smoke. She has quit using smokeless tobacco. She reports current alcohol use of about 1.0 standard drink of alcohol per week. She reports that she does not use drugs.   Family History:  Her family history includes Cancer in her father; Cardiomyopathy in her mother; Diabetes in her brother; Heart disease in her mother.   Allergies Allergies  Allergen Reactions   Nebivolol Swelling    Chest pain (reaction to Bystolic)   Zestril [Lisinopril] Swelling and Other (See Comments)   Ace Inhibitors Swelling and Other (See Comments)    Tongue swell   Morphine And Codeine Itching     Home Medications  Prior to Admission medications   Medication Sig Start Date End Date Taking? Authorizing Provider  acetaminophen (TYLENOL) 500 MG tablet Take 1 tablet (500 mg total) by mouth See admin instructions. Take 1,000mg  (2 tablets) by mouth twice a day in addition to 1,000mg  (2 tablets) twice daily as needed for pain. 05/14/23   Ghimire, Werner Lean, MD  albuterol (PROVENTIL) (2.5 MG/3ML) 0.083% nebulizer solution Take 3 mLs (2.5 mg total) by nebulization every 6 (six) hours as needed for wheezing or shortness of breath. 05/14/23   Ghimire, Werner Lean, MD  albuterol (VENTOLIN HFA) 108 (90 Base) MCG/ACT inhaler Inhale 2 puffs into the lungs every 6 (six) hours as needed for shortness of breath (COPD). 05/14/23   Ghimire, Werner Lean, MD  allopurinol (ZYLOPRIM) 100 MG  tablet Take 1 tablet (100 mg total) by mouth daily. 05/14/23   Ghimire, Werner Lean, MD  budesonide-formoterol (SYMBICORT) 160-4.5 MCG/ACT inhaler Inhale 2 puffs into the lungs in the morning and at bedtime. 05/14/23   Ghimire, Werner Lean, MD  carboxymethylcellulose (REFRESH PLUS) 0.5 % SOLN Place 1 drop into both eyes 3 (three) times daily as needed (dry/irritated eyes.).    [provider]  carvedilol (COREG) 6.25 MG tablet Take 1 tablet (6.25 mg total) by mouth 2 (two) times daily with a meal. 04/17/23   Joseph Art, DO  clopidogrel (PLAVIX) 75 MG tablet Take 1 tablet (75 mg total) by mouth daily. 05/14/23   Ghimire, Werner Lean, MD  diclofenac Sodium (VOLTAREN) 1 % GEL Apply 2 g topically 4 (four) times daily. 04/17/23   Joseph Art, DO  docusate sodium (COLACE) 100 MG capsule Take 1 capsule (100 mg total) by mouth daily. 05/14/23   Ghimire, Werner Lean, MD  fluticasone (FLONASE) 50 MCG/ACT nasal spray Place 1 spray into both nostrils daily. 05/14/23   Ghimire, Werner Lean, MD  insulin lispro (HUMALOG KWIKPEN) 100 UNIT/ML KwikPen 0-15 Units, Subcutaneous, 3 times daily with meal CBG < 70: Implement Hypoglycemia meausers CBG 70 - 120: 0 units CBG 121 - 150: 2 units CBG 151 - 200: 3 units CBG 201 - 250: 5 units CBG 251 - 300: 8 units CBG 301 - 350: 11 units CBG 351 - 400: 15 units CBG > 400: call  MD Patient taking differently: Inject 0-15 Units into the skin 3 (three) times daily. 0-15 Units, Subcutaneous, 3 times daily with meal CBG < 70: Implement Hypoglycemia meausers CBG 70 - 120: 0 units CBG 121 - 150: 2 units CBG 151 - 200: 3 units CBG 201 - 250: 5 units CBG 251 - 300: 8 units CBG 301 - 350: 11 units CBG 351 - 400: 15 units CBG > 400: call MD 05/14/23   Maretta Bees, MD  levothyroxine (SYNTHROID) 175 MCG tablet Take 1 tablet (175 mcg total) by mouth daily. 05/14/23   Ghimire, Werner Lean, MD  lidocaine 4 % Place 2 patches onto the skin daily. Each lower side of back. 05/14/23   Ghimire, Werner Lean, MD   lidocaine-prilocaine (EMLA) cream Apply 1 Application topically See admin instructions. On dialysis days (Tues,Thurs,Sat) 02/13/22   [provider]  meclizine (ANTIVERT) 25 MG tablet Take 25 mg by mouth daily as needed. 06/12/23   [provider]  Methoxy PEG-Epoetin Beta (MIRCERA IJ) Inject 1 Syringe into the skin every 14 (fourteen) days. 06/01/23 05/30/24  [provider]  Multiple Vitamins-Iron (MULTIVITAMINS WITH IRON) TABS tablet Take 1 tablet by mouth in the morning. 05/14/23   Ghimire, Werner Lean, MD  nitroGLYCERIN (NITROSTAT) 0.4 MG SL tablet Place 1 tablet (0.4 mg total) under the tongue every 5 (five) minutes as needed for chest pain. 07/06/23 07/05/24  Cyndi Bender, NP  Nutritional Supplements (FEEDING SUPPLEMENT, NEPRO CARB STEADY,) LIQD Take 237 mLs by mouth 2 (two) times daily between meals. 04/17/23   Joseph Art, DO  OXYGEN Inhale into the lungs.    [provider]  pantoprazole (PROTONIX) 40 MG tablet Take 1 tablet (40 mg total) by mouth 2 (two) times daily before a meal. 05/14/23   Ghimire, Werner Lean, MD  pregabalin (LYRICA) 25 MG capsule Take 25 mg by mouth daily. 05/29/23   [provider]  rosuvastatin (CRESTOR) 20 MG tablet Take 1 tablet (20 mg total) by mouth daily. 07/06/23   Cyndi Bender, NP  sevelamer carbonate (RENVELA) 800 MG tablet Take 2 tablets (1,600 mg total) by mouth 3 (three) times daily with meals. 05/14/23   Ghimire, Werner Lean, MD  thiamine (VITAMIN B1) 100 MG tablet Take 1 tablet (100 mg total) by mouth in the morning. 05/14/23   Ghimire, Werner Lean, MD  umeclidinium bromide (INCRUSE ELLIPTA) 62.5 MCG/ACT AEPB Inhale 1 puff into the lungs daily. 05/14/23   Maretta Bees, MD     Critical care time: 30    [facility] 07/11/2023, 4:35 PM  Please see [email or web address] for pager details.  From 7A-7P if no response, please call [phone]. After hours, please call ELink [phone].

## 2023-07-11 NOTE — Progress Notes (Addendum)
eLink Physician-Brief Progress Note Patient Name: [name] [name] DOB: 08/19/1951 MRN: [id number]   Date of Service  07/11/2023  HPI/Events of Note  ABG shows significant respiratory acidosis.  Since lab has resulted, pt had been placed on BIPAP.  Now awake, alert, oriented x4 - now also refusing for BIPAP to be put back on.   eICU Interventions  - Will offer precedex to be started so that she will be able to tolerate BIPAP.  - As pt is now awake, she is starting to refuse medications, tests, and interventions.  - Will continue to monitor closely.         Leonell Lobdell M DELA CRUZ 07/11/2023, 9:50 PM  6:34 AM Pt complaining of headache.  Currently NPO while on BIPAP.  With known allergy to morphine One time dose of dilaudid 0.25mg  IV ordered.  Will continue to monitor closely

## 2023-07-11 NOTE — Progress Notes (Signed)
RT placed pt on bipap per MD due to VBG results. Pt seems to be tolerating bipap well at this time. RT will continue to monitor as needed.

## 2023-07-11 NOTE — ED Notes (Signed)
ED TO INPATIENT HANDOFF REPORT  ED Nurse Name and Phone #: [phone]  S Name/Age/Gender [name] [name] 72 y.o. female Room/Bed: 016C/016C  Code Status   Code Status: Full Code  Home/SNF/Other Home Patient oriented to: self, place, time, and situation Is this baseline? Yes   Triage Complete: Triage complete  Chief Complaint Acute respiratory failure with hypercapnia (HCC) [J96.02]  Triage Note Pt BIB [facility] from dialysis center with reports of AMS. Per EMS she was lethargic and slow to respond. VSS. Note from dialysis reports AMS since Saturday, 9/28 with decreased appetite. Aox4 at triage.   Allergies Allergies  Allergen Reactions   Nebivolol Swelling    Chest pain (reaction to Bystolic)   Zestril [Lisinopril] Swelling and Other (See Comments)   Ace Inhibitors Swelling and Other (See Comments)    Tongue swell   Morphine And Codeine Itching    Level of Care/Admitting Diagnosis ED Disposition     ED Disposition  Admit   Condition  --   Comment  Hospital Area: [facility] [[id number]]  Level of Care: ICU [6]  May admit patient to [facility] or [facility] if equivalent level of care is available:: Yes  Covid Evaluation: Symptomatic Person Under Investigation (PUI) or recent exposure (last 10 days) *Testing Required*  Diagnosis: Acute respiratory failure with hypercapnia [facility]) [[id number]]  Admitting Physician: Josephine Igo [[id number]]  Attending Physician: Josephine Igo [[id number]]  Certification:: I certify this patient will need inpatient services for at least 2 midnights  Expected Medical Readiness: 07/13/2023          B Medical/Surgery History Past Medical History:  Diagnosis Date   Anemia    Arthritis    Asthma    Chronic diastolic (congestive) heart failure (HCC)    Chronic kidney disease    Dialysis T-TH-Sat   Chronic pain syndrome    Class 2 obesity due to excess calories with body mass index (BMI) of 36.0 to 36.9 in adult     COPD (chronic obstructive pulmonary disease) (HCC)    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   Full dentures    GERD (gastroesophageal reflux disease)    Gout    History of hiatal hernia    History of transfusion    Hx MRSA infection    abscess left groin   Hyperlipidemia    Hypertension    Hypothyroid    Intractable nausea and vomiting 02/15/2021   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HCC)    Peripheral vascular disease (HCC)    Sleep apnea    UNABLE TO TOLERATE C PAP   Stress incontinence    TIA (transient ischemic attack)     X2 NO RESIDUAL PROBLEMS   Wears glasses    Past Surgical History:  Procedure Laterality Date   ABDOMINAL HYSTERECTOMY     AV FISTULA PLACEMENT Left 12/01/2021   Procedure: INSERTION OF LEFT ARM ARTERIOVENOUS (AV) GORE-TEX GRAFT;  Surgeon: Nada Libman, MD;  Location: [facility] OR;  Service: Vascular;  Laterality: Left;   BACK SURGERY  2020   BASCILIC VEIN TRANSPOSITION Left 04/05/2020   Procedure: BASILIC VEIN TRANSPOSITION FIRST STAGE;  Surgeon: Chuck Hint, MD;  Location: [facility] OR;  Service: Vascular;  Laterality: Left;   BRONCHIAL BIOPSY  01/23/2022   Procedure: BRONCHIAL BIOPSIES;  Surgeon: Josephine Igo, DO;  Location: [facility] ENDOSCOPY;  Service: Pulmonary;;   BRONCHIAL BIOPSY  08/07/2022   Procedure: BRONCHIAL BIOPSIES;  Surgeon: Audie Box  L, DO;  Location: [facility] ENDOSCOPY;  Service: Pulmonary;;   BRONCHIAL BRUSHINGS  08/07/2022   Procedure: BRONCHIAL BRUSHINGS;  Surgeon: Josephine Igo, DO;  [facility]OS[facility];  Service: Pulmonary;;   BRONCHIAL NEEDLE ASPIRATION BIOPSY  01/23/2022   Procedure: BRONCHIAL NEEDLE ASPIRATION BIOPSIES;  Surgeon: Josephine Igo, DO;  [facility]OS[facility];  Service: Pulmonary;;   BRONCHIAL NEEDLE ASPIRATION BIOPSY  08/07/2022   Procedure: BRONCHIAL NEEDLE ASPIRATION BIOPSIES;  Surgeon: Josephine Igo, DO;  [facility]OS[facility];  Service: Pulmonary;;   COLON SURGERY  1995    DUE TO POLYP   DILATION AND CURETTAGE OF UTERUS     ENDOBRONCHIAL ULTRASOUND  01/23/2022   Procedure: ENDOBRONCHIAL ULTRASOUND;  Surgeon: Josephine Igo, DO;  Location: [facility] ENDOS[facility];  Service: Pulmonary;;   ENTEROSCOPY N/A 07/10/2021   Procedure: ENTEROSCOPY;  Surgeon: Kerin Salen, MD;  Location: [facility] ENDOSCOPY;  Service: Gastroenterology;  Laterality: N/A;   ESOPHAGOGASTRODUODENOSCOPY N/A 01/25/2021   Procedure: ESOPHAGOGASTRODUODENOSCOPY (EGD);  Surgeon: Vida Rigger, MD;  Location: [facility] ENDOSCOPY;  Service: Endoscopy;  Laterality: N/A;   FINE NEEDLE ASPIRATION  01/23/2022   Procedure: FINE NEEDLE ASPIRATION (FNA) LINEAR;  Surgeon: Josephine Igo, DO;  [facility];  Service: Pulmonary;;   FOREARM FRACTURE SURGERY     Left arm   GIVENS CAPSULE STUDY N/A 07/07/2021   Procedure: GIVENS CAPSULE STUDY;  Surgeon: Vida Rigger, MD;  Location: [facility] ENDOSCOPY;  Service: Endoscopy;  Laterality: N/A;   HERNIA REPAIR     w/ mesh   HOT HEMOSTASIS N/A 07/10/2021   Procedure: HOT HEMOSTASIS (ARGON PLASMA COAGULATION/BICAP);  Surgeon: Kerin Salen, MD;  Location: [facility] ENDOSCOPY;  Service: Gastroenterology;  Laterality: N/A;   INCISION AND DRAINAGE ABSCESS Left 02/04/2013   Procedure: INCISION AND DRAINAGE LEFT BUTTOCK ABSCESS; INCISION AND DRAINAGE LEFT BREAST ABSCESS;  Surgeon: Shelly Rubenstein, MD;  [facility];  Service: General;  Laterality: Left;   INCISION AND DRAINAGE ABSCESS N/A 02/12/2013   Procedure: INCISION AND DEBRIDEMENT BUTTOCK WOUND ;  Surgeon: Wilmon Arms. Corliss Skains, MD;  [facility];  Service: General;  Laterality: N/A;   INCISION AND DRAINAGE ABSCESS N/A 02/14/2013   Procedure: INCISION AND DRAINAGE/DRESSING CHANGE;  Surgeon: Shelly Rubenstein, MD;  [facility];  Service: General;  Laterality: N/A;   INCISION AND DRAINAGE ABSCESS N/A 03/21/2015   Procedure: INCISION AND DRAINAGE PUBIC ABSCESS;  Surgeon: Glenna Fellows, MD;  Location: [facility] ORS;  Service: General;  Laterality: N/A;    INCISION AND DRAINAGE PERIRECTAL ABSCESS Left 02/10/2013   Procedure: IRRIGATION AND DEBRIDEMENT OF BUTTOCK/PERINEAL ABSCESS;  Surgeon: Wilmon Arms. Corliss Skains, MD;  Location[facility];  Service: General;  Laterality: Left;   INCISION AND DRAINAGE PERIRECTAL ABSCESS N/A 02/16/2013   Procedure: IRRIGATION AND DEBRIDEMENT PERINEAL ABSCESS;  Surgeon: Liz Malady, MD;  [facility];  Service: General;  Laterality: N/A;   IR FLUORO GUIDE CV LINE RIGHT  11/30/2021   IR US GUIDE VASC ACCESS RIGHT  11/30/2021   IRRIGATION AND DEBRIDEMENT ABSCESS N/A 02/06/2013   Procedure: IRRIGATION AND DEBRIDEMENT BUTTOCK ABSCESS AND DRESSING CHANGE;  Surgeon: Shelly Rubenstein, MD;  [facility];  Service: General;  Laterality: N/A;   IRRIGATION AND DEBRIDEMENT ABSCESS Left 02/08/2013   Procedure: IRRIGATION AND DEBRIDEMENT ABSCESS/DRESSING CHANGE;  Surgeon: Cherylynn Ridges, MD;  [facility];  Service: General;  Laterality: Left;   JOINT REPLACEMENT  2010 / 2012    LAPAROSCOPIC CHOLECYSTECTOMY     LEFT HEART CATH AND CORONARY  ANGIOGRAPHY N/A 07/05/2023   Procedure: LEFT HEART CATH AND CORONARY ANGIOGRAPHY;  Surgeon: Lennette Bihari, MD;  [facility] INVASIVE CV LAB;  Service: Cardiovascular;  Laterality: N/A;   LEFT HEART CATHETERIZATION WITH CORONARY ANGIOGRAM N/A 07/02/2014   Procedure: LEFT HEART CATHETERIZATION WITH CORONARY ANGIOGRAM;  Surgeon: Runell Gess, MD;  Location: [facility] CATH LAB;  Service: Cardiovascular;  Laterality: N/A;   LOWER EXTREMITY ANGIOGRAM Right 04/20/2016   Procedure: Lower Extremity Angiogram;  Surgeon: Sherren Kerns, MD;  Location: [facility] INVASIVE CV LAB;  Service: Cardiovascular;  Laterality: Right;   MULTIPLE TOOTH EXTRACTIONS     PERIPHERAL VASCULAR CATHETERIZATION N/A 04/20/2016   Procedure: Abdominal Aortogram;  Surgeon: Sherren Kerns, MD;  Location: [facility] INVASIVE CV LAB;  Service: Cardiovascular;  Laterality: N/A;   PERIPHERAL VASCULAR CATHETERIZATION Right 04/20/2016   Procedure:  Peripheral Vascular Intervention;  Surgeon: Sherren Kerns, MD;  Location: [facility] INVASIVE CV LAB;  Service: Cardiovascular;  Laterality: Right;  popiteal   RIGHT HEART CATH N/A 10/27/2018   Procedure: RIGHT HEART CATH;  Surgeon: Laurey Morale, MD;  Location: [facility] INVASIVE CV LAB;  Service: Cardiovascular;  Laterality: N/A;   RIGHT HEART CATH N/A 03/20/2019   Procedure: RIGHT HEART CATH;  Surgeon: Laurey Morale, MD;  Location: [facility] INVASIVE CV LAB;  Service: Cardiovascular;  Laterality: N/A;   THYROIDECTOMY     TOTAL HIP REVISION Right 08/11/2014   Procedure: RIGHT ACETABULAR REVISION;  Surgeon: Loanne Drilling, MD;  Location: [facility] ORS;  Service: Orthopedics;  Laterality: Right;   VASCULAR SURGERY     VIDEO BRONCHOSCOPY WITH ENDOBRONCHIAL ULTRASOUND Bilateral 08/07/2022   Procedure: VIDEO BRONCHOSCOPY WITH ENDOBRONCHIAL ULTRASOUND;  Surgeon: Josephine Igo, DO;  [facility]OS[facility];  Service: Pulmonary;  Laterality: Bilateral;   VIDEO BRONCHOSCOPY WITH RADIAL ENDOBRONCHIAL ULTRASOUND  01/23/2022   Procedure: RADIAL ENDOBRONCHIAL ULTRASOUND;  Surgeon: Josephine Igo, DO;  Location: [facility] ENDOS[facility];  Service: Pulmonary;;     A IV Location/Drains/Wounds Patient Lines/Drains/Airways Status     Active Line/Drains/Airways     Name Placement date Placement time Site Days   Peripheral IV 07/11/23 20 G Anterior;Right Forearm 07/11/23  1322  Forearm  less than 1   Fistula / Graft Left Upper arm  04/05/20  1300  Upper arm  1192   Fistula / Graft Left Upper arm Arteriovenous vein graft 12/01/21  1444  Upper arm  587   Wound / Incision (Open or Dehisced) 04/16/23 Irritant Dermatitis (Moisture Associated Skin Damage) Breast Medial;Right;Upper;Left 04/16/23  2000  Breast  86            Intake/Output Last 24 hours No intake or output data in the 24 hours ending 07/11/23 1636  Labs/Imaging Results for orders placed or performed during the hospital encounter of 07/11/23 (from the past 48 hour(s))   CBG monitoring, ED     Status: Abnormal   Collection Time: 07/11/23  1:18 PM  Result Value Ref Range   Glucose-Capillary 142 (H) 70 - 99 mg/dL    Comment: Glucose reference range applies only to samples taken after fasting for at least 8 hours.  Comprehensive metabolic panel     Status: Abnormal   Collection Time: 07/11/23  1:21 PM  Result Value Ref Range   Sodium 138 135 - 145 mmol/L   Potassium 4.8 3.5 - 5.1 mmol/L   Chloride 95 (L) 98 - 111 mmol/L   CO2 31 22 - 32 mmol/L   Glucose, Bld 145 (H) 70 - 99  mg/dL    Comment: Glucose reference range applies only to samples taken after fasting for at least 8 hours.   BUN 28 (H) 8 - 23 mg/dL   Creatinine, Ser 1.61 (H) 0.44 - 1.00 mg/dL   Calcium 9.0 8.9 - 09.6 mg/dL   Total Protein 7.4 6.5 - 8.1 g/dL   Albumin 3.6 3.5 - 5.0 g/dL   AST 23 15 - 41 U/L   ALT 13 0 - 44 U/L   Alkaline Phosphatase 81 38 - 126 U/L   Total Bilirubin 0.4 0.3 - 1.2 mg/dL   GFR, Estimated 9 (L) >60 mL/min    Comment: (NOTE) Calculated using the CKD-EPI Creatinine Equation (2021)    Anion gap 12 5 - 15    Comment: Performed at [facility], [address] [place], [place] [address]  Ethanol     Status: None   Collection Time: 07/11/23  1:21 PM  Result Value Ref Range   Alcohol, Ethyl (B) <10 <10 mg/dL    Comment: (NOTE) Lowest detectable limit for serum alcohol is 10 mg/dL.  For medical purposes only. Performed at [facility], [address], [place], [place] [address]   Troponin I (High Sensitivity)     Status: Abnormal   Collection Time: 07/11/23  1:21 PM  Result Value Ref Range   Troponin I (High Sensitivity) 90 (H) <18 ng/L    Comment: (NOTE) Elevated high sensitivity troponin I (hsTnI) values and significant  changes across serial measurements may suggest ACS but many other  chronic and acute conditions are known to elevate hsTnI results.  Refer to the "Links" section for chest pain algorithms and additional  guidance. Performed  at [facility], [address] [place], [place] [address]   CBC with Differential     Status: Abnormal   Collection Time: 07/11/23  1:21 PM  Result Value Ref Range   WBC 10.8 (H) 4.0 - 10.5 K/uL   RBC 3.74 (L) 3.87 - 5.11 MIL/uL   Hemoglobin 11.8 (L) 12.0 - 15.0 g/dL   HCT 95.6 21.3 - 08.6 %   MCV 112.6 (H) 80.0 - 100.0 fL   MCH 31.6 26.0 - 34.0 pg   MCHC 28.0 (L) 30.0 - 36.0 g/dL   RDW 57.8 46.9 - 62.9 %   Platelets 237 150 - 400 K/uL   nRBC 0.6 (H) 0.0 - 0.2 %   Neutrophils Relative % 83 %   Neutro Abs 9.0 (H) 1.7 - 7.7 K/uL   Lymphocytes Relative 9 %   Lymphs Abs 0.9 0.7 - 4.0 K/uL   Monocytes Relative 6 %   Monocytes Absolute 0.7 0.1 - 1.0 K/uL   Eosinophils Relative 1 %   Eosinophils Absolute 0.1 0.0 - 0.5 K/uL   Basophils Relative 0 %   Basophils Absolute 0.0 0.0 - 0.1 K/uL   Immature Granulocytes 1 %   Abs Immature Granulocytes 0.10 (H) 0.00 - 0.07 K/uL    Comment: Performed at [facility], [address], [place], [place] [address]  I-stat chem 8, ED     Status: Abnormal   Collection Time: 07/11/23  1:33 PM  Result Value Ref Range   Sodium 139 135 - 145 mmol/L   Potassium 4.7 3.5 - 5.1 mmol/L   Chloride 99 98 - 111 mmol/L   BUN 33 (H) 8 - 23 mg/dL   Creatinine, Ser 3.24 (H) 0.44 - 1.00 mg/dL   Glucose, Bld 401 (H) 70 -  99 mg/dL    Comment: Glucose reference range applies only to samples taken after fasting for at least 8 hours.   Calcium, Ion 1.14 (L) 1.15 - 1.40 mmol/L   TCO2 34 (H) 22 - 32 mmol/L   Hemoglobin 14.3 12.0 - 15.0 g/dL   HCT 62.9 52.8 - 41.3 %  I-Stat Lactic Acid     Status: None   Collection Time: 07/11/23  1:34 PM  Result Value Ref Range   Lactic Acid, Venous 0.9 0.5 - 1.9 mmol/L  I-Stat venous blood gas, ED     Status: Abnormal   Collection Time: 07/11/23  1:34 PM  Result Value Ref Range   pH, Ven 7.169 (LL) 7.25 - 7.43   pCO2, Ven 97.6 (HH) 44 - 60 mmHg   pO2, Ven 54 (H) 32 - 45 mmHg   Bicarbonate 35.5 (H) 20.0 - 28.0  mmol/L   TCO2 38 (H) 22 - 32 mmol/L   O2 Saturation 75 %   Acid-Base Excess 3.0 (H) 0.0 - 2.0 mmol/L   Sodium 137 135 - 145 mmol/L   Potassium 4.7 3.5 - 5.1 mmol/L   Calcium, Ion 1.13 (L) 1.15 - 1.40 mmol/L   HCT 41.0 36.0 - 46.0 %   Hemoglobin 13.9 12.0 - 15.0 g/dL   Sample type VENOUS    Comment NOTIFIED PHYSICIAN    DG Chest Port 1 View  Result Date: 07/11/2023 CLINICAL DATA:  Altered mental status, lethargy EXAM: PORTABLE CHEST 1 VIEW COMPARISON:  05/06/2023 FINDINGS: Stable cardiomegaly. Aortic atherosclerotic calcification. Pulmonary vascular congestion. Bilateral interstitial coarsening. Retrocardiac atelectasis and small left pleural effusion. Right basilar atelectasis. No pneumothorax. IMPRESSION: No significant change from 05/06/2023. Constellation of findings consistent with congestive heart failure. Small left pleural effusion and left lower lung atelectasis or pneumonia. Electronically Signed   By: Minerva Fester M.D.   On: 07/11/2023 15:50    Pending Labs Unresulted Labs (From admission, onward)     Start     Ordered   07/12/23 0500  Basic metabolic panel  Tomorrow morning,   R        07/11/23 1616   07/12/23 0500  CBC  Tomorrow morning,   R        07/11/23 1616   07/12/23 0500  Magnesium  Tomorrow morning,   R        07/11/23 1616   07/11/23 1635  TSH  Once,   R        07/11/23 1634   07/11/23 1616  Procalcitonin  Once,   R       References:    Procalcitonin Lower Respiratory Tract Infection AND Sepsis Procalcitonin Algorithm   07/11/23 1616   07/11/23 1616  Brain natriuretic peptide  Once,   R        07/11/23 1616   07/11/23 1602  Blood gas, arterial  ONCE - STAT,   STAT        07/11/23 1601            Vitals/Pain Today's Vitals   07/11/23 1503 07/11/23 1508 07/11/23 1620 07/11/23 1630  BP:   (!) 80/50 (!) 105/39  Pulse:  (!) 112 95 96  Resp:  (!) 23 18 12   Temp:      TempSrc:      SpO2: 91% 93% 100% 100%    Isolation Precautions No active  isolations  Medications Medications  naloxone (NARCAN) nasal spray 4 mg/0.1 mL (0 sprays Nasal Hold 07/11/23 1502)  ipratropium-albuterol (DUONEB) 0.5-2.5 (  3) MG/3ML nebulizer solution 3 mL (has no administration in time range)  ipratropium-albuterol (DUONEB) 0.5-2.5 (3) MG/3ML nebulizer solution 3 mL (has no administration in time range)  budesonide (PULMICORT) nebulizer solution 0.5 mg (has no administration in time range)  arformoterol (BROVANA) nebulizer solution 15 mcg (has no administration in time range)  revefenacin (YUPELRI) nebulizer solution 175 mcg (has no administration in time range)  0.9 %  sodium chloride infusion (has no administration in time range)  norepinephrine (LEVOPHED) 4mg  in (0.016 mg/mL) premix infusion (2 mcg/min Intravenous New Bag/Given 07/11/23 1633)  docusate sodium (COLACE) capsule 100 mg (has no administration in time range)  polyethylene glycol (MIRALAX / GLYCOLAX) packet 17 g (has no administration in time range)  heparin injection 5,000 Units (has no administration in time range)  sodium bicarbonate injection 100 mEq (has no administration in time range)  albumin human 25 % solution 25 g (has no administration in time range)  naloxone (NARCAN) injection 0.4 mg (0.4 mg Intravenous Given 07/11/23 1322)  ipratropium-albuterol (DUONEB) 0.5-2.5 (3) MG/3ML nebulizer solution 3 mL (3 mLs Nebulization Given 07/11/23 1405)    Mobility non-ambulatory     Focused Assessments Pulmonary Assessment Handoff:  Lung sounds:   O2 Device: Nasal Cannula O2 Flow Rate (L/min): 6 L/min    R Recommendations: See Admitting Provider Note  Report given to:   Additional Notes:

## 2023-07-12 ENCOUNTER — Inpatient Hospital Stay (HOSPITAL_COMMUNITY): Payer: 59

## 2023-07-12 DIAGNOSIS — G9341 Metabolic encephalopathy: Secondary | ICD-10-CM | POA: Diagnosis not present

## 2023-07-12 DIAGNOSIS — J810 Acute pulmonary edema: Secondary | ICD-10-CM

## 2023-07-12 DIAGNOSIS — J9602 Acute respiratory failure with hypercapnia: Secondary | ICD-10-CM | POA: Diagnosis not present

## 2023-07-12 LAB — POCT I-STAT 7, (LYTES, BLD GAS, ICA,H+H)
Acid-Base Excess: 1 mmol/L (ref 0.0–2.0)
Acid-Base Excess: 1 mmol/L (ref 0.0–2.0)
Bicarbonate: 32.4 mmol/L — ABNORMAL HIGH (ref 20.0–28.0)
Bicarbonate: 31.5 mmol/L — ABNORMAL HIGH (ref 20.0–28.0)
Calcium, Ion: 1.2 mmol/L (ref 1.15–1.40)
Calcium, Ion: 1.32 mmol/L (ref 1.15–1.40)
HCT: 39 % (ref 36.0–46.0)
HCT: 38 % (ref 36.0–46.0)
Hemoglobin: 13.3 g/dL (ref 12.0–15.0)
Hemoglobin: 12.9 g/dL (ref 12.0–15.0)
O2 Saturation: 58 %
O2 Saturation: 99 %
Patient temperature: 37.8
Potassium: 4.3 mmol/L (ref 3.5–5.1)
Potassium: 4.4 mmol/L (ref 3.5–5.1)
Sodium: 133 mmol/L — ABNORMAL LOW (ref 135–145)
Sodium: 133 mmol/L — ABNORMAL LOW (ref 135–145)
TCO2: 35 mmol/L — ABNORMAL HIGH (ref 22–32)
TCO2: 34 mmol/L — ABNORMAL HIGH (ref 22–32)
pCO2 arterial: 88.7 mm[Hg] (ref 32–48)
pCO2 arterial: 83.3 mm[Hg] (ref 32–48)
pH, Arterial: 7.17 — CL (ref 7.35–7.45)
pH, Arterial: 7.19 — CL (ref 7.35–7.45)
pO2, Arterial: 40 mm[Hg] — CL (ref 83–108)
pO2, Arterial: 167 mm[Hg] — ABNORMAL HIGH (ref 83–108)

## 2023-07-12 LAB — GLUCOSE, CAPILLARY
Glucose-Capillary: 155 mg/dL — ABNORMAL HIGH (ref 70–99)
Glucose-Capillary: 168 mg/dL — ABNORMAL HIGH (ref 70–99)
Glucose-Capillary: 178 mg/dL — ABNORMAL HIGH (ref 70–99)
Glucose-Capillary: 188 mg/dL — ABNORMAL HIGH (ref 70–99)
Glucose-Capillary: 213 mg/dL — ABNORMAL HIGH (ref 70–99)
Glucose-Capillary: 221 mg/dL — ABNORMAL HIGH (ref 70–99)

## 2023-07-12 LAB — TROPONIN I (HIGH SENSITIVITY): Troponin I (High Sensitivity): 83 ng/L — ABNORMAL HIGH (ref <18)

## 2023-07-12 LAB — HEPATITIS B SURFACE ANTIBODY, QUANTITATIVE: Hep B S AB Quant (Post): <3.5 m[IU]/mL — ABNORMAL LOW

## 2023-07-12 LAB — CBC
HCT: 44.3 % (ref 36.0–46.0)
Hemoglobin: 12.2 g/dL (ref 12.0–15.0)
MCH: 30.5 pg (ref 26.0–34.0)
MCHC: 27.5 g/dL — ABNORMAL LOW (ref 30.0–36.0)
MCV: 110.8 fL — ABNORMAL HIGH (ref 80.0–100.0)
Platelets: 245 10*3/uL (ref 150–400)
RBC: 4 MIL/uL (ref 3.87–5.11)
RDW: 14.6 % (ref 11.5–15.5)
WBC: 19.3 10*3/uL — ABNORMAL HIGH (ref 4.0–10.5)
nRBC: 2.1 % — ABNORMAL HIGH (ref 0.0–0.2)

## 2023-07-12 LAB — BASIC METABOLIC PANEL
Anion gap: 15 (ref 5–15)
BUN: 21 mg/dL (ref 8–23)
CO2: 28 mmol/L (ref 22–32)
Calcium: 9.2 mg/dL (ref 8.9–10.3)
Chloride: 93 mmol/L — ABNORMAL LOW (ref 98–111)
Creatinine, Ser: 3.86 mg/dL — ABNORMAL HIGH (ref 0.44–1.00)
GFR, Estimated: 12 mL/min — ABNORMAL LOW (ref >60)
Glucose, Bld: 149 mg/dL — ABNORMAL HIGH (ref 70–99)
Potassium: 4.5 mmol/L (ref 3.5–5.1)
Sodium: 136 mmol/L (ref 135–145)

## 2023-07-12 LAB — MAGNESIUM
Magnesium: 2.2 mg/dL (ref 1.7–2.4)
Magnesium: 2.1 mg/dL (ref 1.7–2.4)

## 2023-07-12 LAB — PHOSPHORUS: Phosphorus: 5.6 mg/dL — ABNORMAL HIGH (ref 2.5–4.6)

## 2023-07-12 MED ORDER — PIPERACILLIN-TAZOBACTAM IN DEX 2-0.25 GM/50ML IV SOLN
2.2500 g | Freq: Three times a day (TID) | INTRAVENOUS | Status: DC
  Administered 2023-07-12 – 2023-07-13 (×4): 2.25 g via INTRAVENOUS
  Filled 2023-07-12 (×6): qty 50

## 2023-07-12 MED ORDER — CHLORHEXIDINE GLUCONATE CLOTH 2 % EX PADS: 6.0000 | MEDICATED_PAD | Freq: Every day | CUTANEOUS | Status: DC

## 2023-07-12 MED ORDER — INSULIN ASPART 100 UNIT/ML IJ SOLN: 0.0000 [IU] | INTRAMUSCULAR | Status: DC

## 2023-07-12 MED ORDER — INSULIN ASPART 100 UNIT/ML IJ SOLN
0.0000 [IU] | INTRAMUSCULAR | Status: DC
  Administered 2023-07-12: 7 [IU] via SUBCUTANEOUS
  Administered 2023-07-12: 4 [IU] via SUBCUTANEOUS
  Administered 2023-07-12 – 2023-07-13 (×2): 7 [IU] via SUBCUTANEOUS
  Administered 2023-07-13: 11 [IU] via SUBCUTANEOUS

## 2023-07-12 MED ORDER — VANCOMYCIN VARIABLE DOSE PER UNSTABLE RENAL FUNCTION (PHARMACIST DOSING): Status: DC

## 2023-07-12 MED ORDER — INSULIN ASPART 100 UNIT/ML IJ SOLN
1.0000 [IU] | INTRAMUSCULAR | Status: DC
  Administered 2023-07-12 (×2): 2 [IU] via SUBCUTANEOUS

## 2023-07-12 MED ORDER — VANCOMYCIN HCL 2000 MG/400ML IV SOLN
2000.0000 mg | Freq: Once | INTRAVENOUS | Status: AC
  Administered 2023-07-12: 2000 mg via INTRAVENOUS
  Filled 2023-07-12: qty 400

## 2023-07-12 MED ORDER — HYDROCORTISONE SOD SUC (PF) 100 MG IJ SOLR
100.0000 mg | Freq: Two times a day (BID) | INTRAMUSCULAR | Status: DC
  Administered 2023-07-12 – 2023-07-16 (×8): 100 mg via INTRAVENOUS
  Filled 2023-07-12 (×8): qty 2

## 2023-07-12 MED ORDER — HYDROMORPHONE HCL 1 MG/ML IJ SOLN
0.2500 mg | Freq: Once | INTRAMUSCULAR | Status: AC
  Administered 2023-07-12: 0.25 mg via INTRAVENOUS
  Filled 2023-07-12: qty 0.5

## 2023-07-12 MED ORDER — VITAL AF 1.2 CAL PO LIQD
1000.0000 mL | ORAL | Status: DC
  Administered 2023-07-12: 1000 mL

## 2023-07-12 MED ORDER — PROSOURCE TF20 ENFIT COMPATIBL EN LIQD
60.0000 mL | Freq: Every day | ENTERAL | Status: DC
  Administered 2023-07-12 – 2023-07-13 (×2): 60 mL
  Filled 2023-07-12 (×2): qty 60

## 2023-07-12 MED ORDER — WHITE PETROLATUM EX OINT
TOPICAL_OINTMENT | CUTANEOUS | Status: DC | PRN
  Filled 2023-07-12: qty 28.35

## 2023-07-12 NOTE — Progress Notes (Signed)
ABG results given to MD. Patient okay to come off bipap and placed on 8L [place]. RN made aware.

## 2023-07-12 NOTE — Progress Notes (Signed)
NAME:  [name] [name], MRN:  [id number], DOB:  09-21-1951, LOS: 1 ADMISSION DATE:  07/11/2023, CONSULTATION DATE:  07/11/2023 REFERRING MD:  Johny Blamer, IM, CHIEF COMPLAINT:  shortness of breath    History of Present Illness:  72 year old female with past medical history of TIA, HTN, HLD, GERD, DM, COPD on 3LNC, obesity, ESRD on HD TThS, recent catheterization who presents to the emergency department after dialysis for altered mental status. Per EDP, patient completed dialysis today. She was drowsy at completion of treatment and was sent to ED. On arrival, patient was obtunded, requiring significant tactile stimulation to awaken. Patient was placed on BiPAP which improved her mental status. ABG and CXR obtained showing fluid overload, and significant acidosis with pH 7.169, pCO2 97.6, pO2 54. After about 30 minutes of BiPAP treatment patient took herself off, refusing to wear it again. She was noted to be coughing up clear sputum. Hospitalist was consulted and subsequently PCCM consulted for severe acidosis. Labs in ED notable for WBC 10.8, BUN 33, Cr 5.10, pH as above, troponin 90, lactic negative. CXR with pulmonary congestion, CT head and CT chest are pending at time of evaluation.   On my exam, the patient is awake, alert, asking to go home or eat. She is not very sure why she needs admission or further BiPAP at this time. She denies feeling sick in the past few days. She does endorse being compliant on her dialysis. Notes she wears 3LNC which is her baseline and has not had to increase recently. She endorses compliance with medications. She denies new cough or fever, n/v/d.   After exam, PCCM was re-consulted as patient became obtunded again, hypotensive requiring peripheral levo and was admitted to [facility].   Pertinent  Medical History  TIA, HTN, HLD, GERD, DM, COPD on 3LNC, obesity, ESRD on HD TThS  Significant Hospital Events: Including procedures, antibiotic start and stop dates in addition to  other pertinent events   10/3: admitted to PCCM   Interim History / Subjective:  She has been pulling intermittently at BiPAP, but wore it overnight.   Objective   Blood pressure (!) 107/45, pulse (!) 110, temperature 99.1 F (37.3 C), temperature source Axillary, resp. rate (!) 7, weight 103.2 kg, SpO2 94%.    FiO2 (%):  [30 %-50 %] 40 %   Intake/Output Summary (Last 24 hours) at 07/12/2023 0711 Last data filed at 07/12/2023 0600 Gross per 24 hour  Intake 244.41 ml  Output 2500 ml  Net -2255.59 ml   Filed Weights   07/11/23 1742 07/12/23 0314  Weight: 104.8 kg 103.2 kg    Examination: General: critically ill-appearing woman lying in bed in NAD HENT: [place]/AT, eyes anicteric  Lungs: CTAB, comfortably breathing on bipap.  Cardiovascular: S1S2, tachycardic, reg rhythm Abdomen: soft, NT Extremities: mild edema, no cyanosis Neuro: awake, intermittently alert and following commands, sometimes less attentive. Moving all extremities Derm: no diffuse rashes.  Trop 90>103 BUN 21 Cr 3.86 WBC 19.3 H/H 12.2/44.3 Platelets 245  Repeat VBG 7.17/89/  / 32  PCT 0.52  Resolved Hospital Problem list     Assessment & Plan:  Acute on chronic hypercarbic, hypoxic respiratory failure 2/2 fluid volume overload, potentially also a component of COPD exacerbation. OHS potentially contributing as well.  COPD on 3LNC  Blood gas slowly improving, needs more BiPAP.  -Repeat ABG this morning showing improvement - Continue BiPAP, Precedex as needed -HD per nephrology.  May need additional doses - Continue triple inhaled therapy and PRN nebs -  Likely needs polysomnogram for outpatient home BiPAP  Shock; hypotension likely related to respiratory acidosis, precedex need PRN. Unfortunately with mildly elevated PCT & significant leukocytosis, need to consider possibility of infection. Pneumonia could be a potential source as well as bacteremia. - blood cultures -empiric antibiotics -NE to maintain  MAP >65 -hold PTA antihypertensives  ESRD on HD TThS; last HD on 10/3 -strict I/O -renally dose meds, avoid nephrotoxic meds  DM -SSI PRN -goal BG 140-180  HTN HLD CAD- recent left heart cath with LMCx 40%, LAD 10%  HFrEF  -con't to hold carvedilol, nitroglycerin, crestor while hypotensive  Hypothyroidism; TSH WNL - can resume PTA synthroid when taking PO  Acute metabolic encephalopathy, presumed to be due to hypercapnia.  -cortrak today to give meds, allow for nutrition  Best Practice (right click and "Reselect all SmartList Selections" daily)   Diet/type: NPO DVT prophylaxis: subQ heparin GI prophylaxis: n/a Lines: n/a Foley:  n/a Code Status:  full code  Last date of multidisciplinary goals of care discussion [10/3 with Dr Icard]  Labs   CBC: Recent Labs  Lab 07/06/23 0444 07/11/23 1321 07/11/23 1333 07/11/23 1334 07/11/23 1922 07/12/23 0308  WBC 7.5 10.8*  --   --   --  19.3*  NEUTROABS  --  9.0*  --   --   --   --   HGB 10.4* 11.8* 14.3 13.9 12.9 12.2  HCT 34.7* 42.1 42.0 41.0 38.0 44.3  MCV 106.8* 112.6*  --   --   --  110.8*  PLT 174 237  --   --   --  245    Basic Metabolic Panel: Recent Labs  Lab 07/06/23 0444 07/11/23 1321 07/11/23 1333 07/11/23 1334 07/11/23 1922 07/12/23 0308  NA 134* 138 139 137 138 136  K 5.4* 4.8 4.7 4.7 4.8 4.5  CL 96* 95* 99  --   --  93*  CO2 26 31  --   --   --  28  GLUCOSE 144* 145* 144*  --   --  149*  BUN 64* 28* 33*  --   --  21  CREATININE 7.67* 4.92* 5.10*  --   --  3.86*  CALCIUM 8.6* 9.0  --   --   --  9.2  MG  --   --   --   --   --  2.2   GFR: Estimated Creatinine Clearance: 15.1 mL/min (A) (by C-G formula based on SCr of 3.86 mg/dL (H)). Recent Labs  Lab 07/06/23 0444 07/11/23 1321 07/11/23 1334 07/11/23 1611 07/12/23 0308  PROCALCITON  --   --   --  0.52  --   WBC 7.5 10.8*  --   --  19.3*  LATICACIDVEN  --   --  0.9  --   --     Liver Function Tests: Recent Labs  Lab 07/11/23 1321   AST 23  ALT 13  ALKPHOS 81  BILITOT 0.4  PROT 7.4  ALBUMIN 3.6    ABG    Component Value Date/Time   PHART 7.153 (LL) 07/11/2023 1922   PCO2ART 104.8 (HH) 07/11/2023 1922   PO2ART 56 (L) 07/11/2023 1922   HCO3 37.1 (H) 07/11/2023 1922   TCO2 40 (H) 07/11/2023 1922   O2SAT 78 07/11/2023 1922        Critical care time:     This patient is critically ill with multiple organ system failure which requires frequent high complexity decision making, assessment, support, evaluation, and  titration of therapies. This was completed through the application of advanced monitoring technologies and extensive interpretation of multiple databases. During this encounter critical care time was devoted to patient care services described in this note for 38 minutes.  Steffanie Dunn, DO 07/12/23 8:12 AM [facility]  For contact information, see Amion. If no response to pager, please call PCCM consult pager. After hours, 7PM- 7AM, please call Elink.

## 2023-07-12 NOTE — Progress Notes (Signed)
[facility] Progress Note  Subjective: seen in ICU, pt on bipap, sleeping  Vitals:   07/12/23 1415 07/12/23 1430 07/12/23 1440 07/12/23 1445  BP:   (!) 87/74 (!) 116/31  Pulse: 82 93  88  Resp: 17 (!) 22  16  Temp:      TempSrc:      SpO2: 98% 98%  99%  Weight:        Exam: Gen [place] O2, obese, on bipap No jvd or bruits Chest clear bilat  RRR no MRG Abd soft ntnd no mass or ascites +bs Ext 1+ bilat pretib edema    LUA AVG+bruit     Renal-related home meds: - coreg 6.25 bid - home O2  - pregabalin 25 every day - renvela 2 ac tid        OP HD: [facility] TTS 4h   400/600  99kg  2/2 bath   aVG   Heparin 2000+ 2000 midrun - last HD 10/3 today, post wt 106kg - 3wks ago coming off 3 over, this last week coming off 7kg up - hectorol 3 mcg  - venofer 100mg  three times per week, thru 10/05 - mircera 50 mcg IV q 2 wks, last 10/3, due 10/17     Assessment/ Plan: ESRD - on HD TTS. Had HD at OP unit on Thursday. We did extra HD overnight last night due to resp failure and signs of pulm edema on CXR. Got 2.5 L off. HD tomorrow.  Volume overload - is about 4kg over today, exam not showing sig volume. CXR today shows IS edema resolved. Cont to lower vol w/ HD tomorrow.  BP - takes coreg at home. BP's soft here, on levophed gtt at this time. 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 No esa needs at this time.  MBD ckd - CCa in range, will add on phos.  DNR      [name] MD  [facility] 07/12/2023, 2:47 PM  Recent Labs  Lab 07/11/23 1321 07/11/23 1333 07/11/23 1334 07/12/23 0308 07/12/23 0748 07/12/23 1410  HGB 11.8* 14.3   < > 12.2 13.3 12.9  ALBUMIN 3.6  --   --   --   --   --   CALCIUM 9.0  --   --  9.2  --   --   CREATININE 4.92* 5.10*  --  3.86*  --   --   K 4.8 4.7   < > 4.5 4.3 4.4   < > = values in this interval not displayed.   No results for input(s): "IRON", "TIBC", "FERRITIN" in the last 168 hours. Inpatient medications:  arformoterol  15 mcg Nebulization Q12H    budesonide (PULMICORT) nebulizer solution  0.5 mg Nebulization BID   Chlorhexidine Gluconate Cloth  6 each Topical Q0600   heparin  2,000 Units Dialysis Once in dialysis   heparin  5,000 Units Subcutaneous Q8H   hydrocortisone sod succinate (SOLU-CORTEF) inj  100 mg Intravenous Q12H   insulin aspart  0-20 Units Subcutaneous Q4H   naloxone  1 spray Nasal Once   revefenacin  175 mcg Nebulization Daily   vancomycin variable dose per unstable renal function (pharmacist dosing)   Does not apply See admin instructions    sodium chloride     albumin human     anticoagulant sodium citrate     dexmedetomidine (PRECEDEX) IV infusion 0.4 mcg/kg/hr (07/12/23 1433)   norepinephrine (LEVOPHED) Adult infusion 9 mcg/min (07/12/23 1432)   piperacillin-tazobactam (ZOSYN)  IV Stopped (07/12/23  1033)   alteplase, anticoagulant sodium citrate, docusate sodium, feeding supplement (NEPRO CARB STEADY), heparin, heparin, ipratropium-albuterol, lidocaine (PF), lidocaine-prilocaine, pentafluoroprop-tetrafluoroeth, polyethylene glycol

## 2023-07-12 NOTE — Progress Notes (Signed)
Pharmacy Antibiotic Note  [name] [name] is a 72 y.o. female admitted on 07/11/2023 with pneumonia.  Pharmacy has been consulted for Vancomycin and Zosyn dosing. ESRD with HD - last HD 10/3 then respiratory distress currently on BiPAP.   Plan: Zosyn 2.25g IV every 8 hours.  Vancomycin 2000mg  IV x1 then follow-up HD plans.  Monitor culture results, clinical status, and HD plans.   Weight: 103.2 kg (227 lb 8.2 oz)  Temp (24hrs), Avg:98.4 F (36.9 C), Min:97.7 F (36.5 C), Max:99.5 F (37.5 C)  Recent Labs  Lab 07/06/23 0444 07/11/23 1321 07/11/23 1333 07/11/23 1334 07/12/23 0308  WBC 7.5 10.8*  --   --  19.3*  CREATININE 7.67* 4.92* 5.10*  --  3.86*  LATICACIDVEN  --   --   --  0.9  --     Estimated Creatinine Clearance: 15.1 mL/min (A) (by C-G formula based on SCr of 3.86 mg/dL (H)).    Allergies  Allergen Reactions   Nebivolol Swelling    Chest pain (reaction to Bystolic)   Zestril [Lisinopril] Swelling and Other (See Comments)   Ace Inhibitors Swelling and Other (See Comments)    Tongue swell   Morphine And Codeine Itching    Antimicrobials this admission: Zosyn 10/4 >> Vancomycin 10/4 >>  Dose adjustments this admission:  Microbiology results: 10/4 BCx:  10/3 MRSA PCR: negative  Thank you for allowing pharmacy to be a part of this patient's care.  Link Snuffer, PharmD, BCPS, BCCCP Please refer to [facility] for [facility] numbers 07/12/2023 9:20 AM

## 2023-07-12 NOTE — Procedures (Signed)
Cortrak  Person Inserting Tube:  Greig Castilla D, RD Tube Type:  Cortrak - 55 inches Tube Size:  10 Tube Location:  Left nare Secured by: Bridle Technique Used to Measure Tube Placement:  Marking at nare/corner of mouth Cortrak Secured At:  99 cm Procedure Comments:  Cortrak Tube Team Note:  Consult received to place a Cortrak feeding tube.   X-ray is required, abdominal x-ray has been ordered by the Cortrak team. Please confirm tube placement before using the Cortrak tube.   If the tube becomes dislodged please keep the tube and contact the Cortrak team at [email or web address] for replacement.  If after hours and replacement cannot be delayed, place a NG tube and confirm placement with an abdominal x-ray.    Greig Castilla, RD, LDN Clinical Dietitian RD pager # available in AMION  After hours/weekend pager # available in [facility]

## 2023-07-12 NOTE — Progress Notes (Signed)
Pt receives out-pt HD at [facility] on TTS. Will assist as needed.   Veleta Yamamoto Renal Navigator [phone] 

## 2023-07-12 NOTE — Progress Notes (Signed)
Report given to [facility]

## 2023-07-12 NOTE — Progress Notes (Signed)
ABG attempted. VBG sample obtained. MD okay. Orders to continue bipap.

## 2023-07-12 NOTE — Progress Notes (Signed)
   07/12/23 0317  BiPAP/CPAP/SIPAP  $ Non-Invasive Ventilator  Non-Invasive Vent Subsequent  BiPAP/CPAP/SIPAP Pt Type Adult  BiPAP/CPAP/SIPAP V60  Mask Type Full face mask  Mask Size Large  Set Rate 20 breaths/min  Respiratory Rate 22 breaths/min  IPAP 20 cmH20  EPAP 10 cmH2O  FiO2 (%) 40 %  Flow Rate 0 lpm  Minute Ventilation 0.8  Leak 12  Peak Inspiratory Pressure (PIP) 22  Tidal Volume (Vt) 541  Patient Home Equipment No  Auto Titrate No  Press High Alarm 30 cmH2O  Press Low Alarm 5 cmH2O

## 2023-07-12 NOTE — Progress Notes (Signed)
Brief Nutrition Note  Consult received for enteral/tube feeding initiation and management.  Adult Enteral Nutrition Protocol initiated. Full assessment to follow.  Cortrak tube in place, abdominal x-ray imaging has been obtained and read is pending.  Admitting Dx: ESRD (end stage renal disease) on dialysis (HCC) [N18.6, Z99.2] Acute respiratory failure with hypercapnia (HCC) [J96.02] Acute respiratory failure with hypoxia and hypercarbia (HCC) [J96.01, J96.02]  Labs:  Recent Labs  Lab 07/06/23 0444 07/11/23 1321 07/11/23 1333 07/11/23 1334 07/12/23 0308 07/12/23 0748 07/12/23 1410  NA 134* 138 139   < > 136 133* 133*  K 5.4* 4.8 4.7   < > 4.5 4.3 4.4  CL 96* 95* 99  --  93*  --   --   CO2 26 31  --   --  28  --   --   BUN 64* 28* 33*  --  21  --   --   CREATININE 7.67* 4.92* 5.10*  --  3.86*  --   --   CALCIUM 8.6* 9.0  --   --  9.2  --   --   MG  --   --   --   --  2.2  --   --   GLUCOSE 144* 145* 144*  --  149*  --   --    < > = values in this interval not displayed.    Mertie Clause, MS, RD, LDN Registered Dietitian II Please see AMiON for contact information.

## 2023-07-13 DIAGNOSIS — N186 End stage renal disease: Secondary | ICD-10-CM

## 2023-07-13 DIAGNOSIS — A419 Sepsis, unspecified organism: Secondary | ICD-10-CM | POA: Diagnosis not present

## 2023-07-13 DIAGNOSIS — R6521 Severe sepsis with septic shock: Secondary | ICD-10-CM | POA: Diagnosis not present

## 2023-07-13 DIAGNOSIS — Z992 Dependence on renal dialysis: Secondary | ICD-10-CM

## 2023-07-13 DIAGNOSIS — J9602 Acute respiratory failure with hypercapnia: Secondary | ICD-10-CM | POA: Diagnosis not present

## 2023-07-13 LAB — GLUCOSE, CAPILLARY
Glucose-Capillary: 233 mg/dL — ABNORMAL HIGH (ref 70–99)
Glucose-Capillary: 274 mg/dL — ABNORMAL HIGH (ref 70–99)
Glucose-Capillary: 237 mg/dL — ABNORMAL HIGH (ref 70–99)
Glucose-Capillary: 174 mg/dL — ABNORMAL HIGH (ref 70–99)
Glucose-Capillary: 397 mg/dL — ABNORMAL HIGH (ref 70–99)

## 2023-07-13 LAB — TROPONIN I (HIGH SENSITIVITY): Troponin I (High Sensitivity): 158 ng/L (ref <18)

## 2023-07-13 LAB — PHOSPHORUS: Phosphorus: 5.4 mg/dL — ABNORMAL HIGH (ref 2.5–4.6)

## 2023-07-13 LAB — RENAL FUNCTION PANEL
Albumin: 2.9 g/dL — ABNORMAL LOW (ref 3.5–5.0)
Anion gap: 15 (ref 5–15)
BUN: 55 mg/dL — ABNORMAL HIGH (ref 8–23)
CO2: 25 mmol/L (ref 22–32)
Calcium: 9.2 mg/dL (ref 8.9–10.3)
Chloride: 90 mmol/L — ABNORMAL LOW (ref 98–111)
Creatinine, Ser: 5.45 mg/dL — ABNORMAL HIGH (ref 0.44–1.00)
GFR, Estimated: 8 mL/min — ABNORMAL LOW (ref >60)
Glucose, Bld: 244 mg/dL — ABNORMAL HIGH (ref 70–99)
Phosphorus: 5.3 mg/dL — ABNORMAL HIGH (ref 2.5–4.6)
Potassium: 4.1 mmol/L (ref 3.5–5.1)
Sodium: 130 mmol/L — ABNORMAL LOW (ref 135–145)

## 2023-07-13 LAB — MAGNESIUM: Magnesium: 2.1 mg/dL (ref 1.7–2.4)

## 2023-07-13 MED ORDER — INSULIN GLARGINE-YFGN 100 UNIT/ML ~~LOC~~ SOLN
15.0000 [IU] | Freq: Every day | SUBCUTANEOUS | Status: DC
  Administered 2023-07-13: 15 [IU] via SUBCUTANEOUS
  Filled 2023-07-13 (×2): qty 0.15

## 2023-07-13 MED ORDER — MIDODRINE HCL 5 MG PO TABS
5.0000 mg | ORAL_TABLET | Freq: Three times a day (TID) | ORAL | Status: DC
  Administered 2023-07-13: 5 mg via ORAL
  Filled 2023-07-13: qty 1

## 2023-07-13 MED ORDER — ORAL CARE MOUTH RINSE
15.0000 mL | OROMUCOSAL | Status: DC
  Administered 2023-07-13: 15 mL via OROMUCOSAL

## 2023-07-13 MED ORDER — INSULIN ASPART 100 UNIT/ML IJ SOLN
0.0000 [IU] | Freq: Three times a day (TID) | INTRAMUSCULAR | Status: DC
  Administered 2023-07-13: 7 [IU] via SUBCUTANEOUS
  Administered 2023-07-13: 4 [IU] via SUBCUTANEOUS
  Administered 2023-07-14: 11 [IU] via SUBCUTANEOUS
  Administered 2023-07-14: 7 [IU] via SUBCUTANEOUS
  Administered 2023-07-14: 11 [IU] via SUBCUTANEOUS
  Administered 2023-07-15: 4 [IU] via SUBCUTANEOUS
  Administered 2023-07-15 – 2023-07-16 (×2): 7 [IU] via SUBCUTANEOUS
  Administered 2023-07-17: 3 [IU] via SUBCUTANEOUS
  Administered 2023-07-17: 7 [IU] via SUBCUTANEOUS
  Administered 2023-07-17: 4 [IU] via SUBCUTANEOUS
  Administered 2023-07-18: 3 [IU] via SUBCUTANEOUS
  Administered 2023-07-18: 4 [IU] via SUBCUTANEOUS
  Administered 2023-07-19 (×3): 7 [IU] via SUBCUTANEOUS

## 2023-07-13 MED ORDER — LINEZOLID 600 MG PO TABS
600.0000 mg | ORAL_TABLET | Freq: Two times a day (BID) | ORAL | Status: DC
  Administered 2023-07-13: 600 mg
  Filled 2023-07-13 (×2): qty 1

## 2023-07-13 MED ORDER — INSULIN ASPART 100 UNIT/ML IJ SOLN
4.0000 [IU] | Freq: Three times a day (TID) | INTRAMUSCULAR | Status: DC
  Administered 2023-07-13 – 2023-07-14 (×3): 4 [IU] via SUBCUTANEOUS

## 2023-07-13 MED ORDER — ORAL CARE MOUTH RINSE: 15.0000 mL | OROMUCOSAL | Status: DC | PRN

## 2023-07-13 MED ORDER — MIDODRINE HCL 5 MG PO TABS
15.0000 mg | ORAL_TABLET | Freq: Three times a day (TID) | ORAL | Status: DC
  Administered 2023-07-13 – 2023-07-20 (×18): 15 mg via ORAL
  Filled 2023-07-13 (×19): qty 3

## 2023-07-13 MED ORDER — LEVOTHYROXINE SODIUM 75 MCG PO TABS
175.0000 ug | ORAL_TABLET | Freq: Every day | ORAL | Status: DC
  Administered 2023-07-14 – 2023-07-20 (×7): 175 ug via ORAL
  Filled 2023-07-13 (×2): qty 1
  Filled 2023-07-13 (×2): qty 3
  Filled 2023-07-13: qty 1
  Filled 2023-07-13: qty 3
  Filled 2023-07-13 (×2): qty 1

## 2023-07-13 MED ORDER — AMOXICILLIN-POT CLAVULANATE 500-125 MG PO TABS
1.0000 | ORAL_TABLET | ORAL | Status: AC
  Administered 2023-07-13 – 2023-07-18 (×6): 1
  Filled 2023-07-13 (×6): qty 1

## 2023-07-13 MED ORDER — MIDODRINE HCL 5 MG PO TABS: 10.0000 mg | ORAL_TABLET | Freq: Three times a day (TID) | ORAL | Status: DC

## 2023-07-13 MED ORDER — VANCOMYCIN HCL IN DEXTROSE 1-5 GM/200ML-% IV SOLN
1000.0000 mg | INTRAVENOUS | Status: DC
  Filled 2023-07-13: qty 200

## 2023-07-13 MED ORDER — ROSUVASTATIN CALCIUM 20 MG PO TABS
20.0000 mg | ORAL_TABLET | Freq: Every day | ORAL | Status: DC
  Filled 2023-07-13: qty 1

## 2023-07-13 NOTE — Progress Notes (Signed)
PT Cancellation Note  Patient Details Name: [name] [name] MRN: [id number] DOB: 05/11/1951   Cancelled Treatment:    Reason Eval/Treat Not Completed: Medical issues which prohibited therapy;Patient at procedure or test/unavailable (Pt in HD.  Will check back at later date.)   Bevelyn Buckles 07/13/2023, 1:51 PM [name] [name],PT Acute Rehab Services [phone]

## 2023-07-13 NOTE — Progress Notes (Signed)
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 Per EDP, patient completed dialysis today. She was drowsy at completion of treatment and was sent to ED. On arrival, patient was obtunded, requiring significant tactile stimulation to awaken. Patient was placed on BiPAP which improved her mental status. ABG and CXR obtained showing fluid overload, and significant acidosis with pH 7.169, pCO2 97.6, pO2 54. After about 30 minutes of BiPAP treatment patient took herself off, refusing to wear it again. She was noted to be coughing up clear sputum. Hospitalist was consulted and subsequently PCCM consulted for severe acidosis. Labs in ED notable for WBC 10.8, BUN 33, Cr 5.10, pH as above, troponin 90, lactic negative. CXR with pulmonary congestion, CT head and CT chest are pending at time of evaluation.   On my exam, the patient is awake, alert, asking to go home or eat. She is not very sure why she needs admission or further BiPAP at this time. She denies feeling sick in the past few days. She does endorse being compliant on her dialysis. Notes she wears 3LNC which is her baseline and has not had to increase recently. She endorses compliance with medications. She denies new cough or fever, n/v/d.   After exam, PCCM was re-consulted as patient became obtunded again, hypotensive requiring peripheral levo and was admitted to [facility].   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 She does not want to go back on BiPAP.   Objective   Blood pressure 97/67, pulse 84, temperature 98 F (36.7 C), temperature source Axillary, resp. rate 18, weight 102.9 kg, SpO2 96%.    FiO2 (%):  [40 %] 40 %   Intake/Output Summary (Last 24 hours) at 07/13/2023 1014 Last data filed at 07/13/2023 1000 Gross per 24 hour  Intake 2133.85 ml  Output --  Net 2133.85 ml   Filed Weights   07/11/23 1742 07/12/23 0314 07/13/23 0112  Weight: 104.8 kg 103.2 kg 102.9 kg    Examination: General: Chronically ill-appearing woman sitting up in bed in no acute distress, much more awake and alert today HENT: C/AT, eyes anicteric.  Core track in place. Lungs: Comfortably on nasal cannula, CTAB, reduced breath sounds in the bases. Cardiovascular: S1-S2, regular rate and rhythm Abdomen: Obese, soft, nontender Extremities: Minimal edema, no cyanosis or clubbing Neuro: Awake, alert, normal speech and answering questions appropriately.  Moving extremities. Derm: Warm, dry, no diffuse rashes  EKG-normal sinus, left bundle, no ischemic changes.  QTc 449  Troponin down trended to 83 Blood cultures-no growth to date  Resolved Hospital Problem list     Assessment & Plan:  Acute on chronic hypercarbic, hypoxic respiratory failure 2/2 fluid volume overload, potentially also a component of COPD exacerbation. OHS potentially contributing as well.  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 -No  longer requiring BiPAP -HD per nephrology, -Continue triple inhaler therapy plus as needed nebs -Recommend outpatient polysomnogram, although she is very disinclined to use positive pressure ventilation future.  Shock-concern for septic with unknown source of infection; hypotension likely related to respiratory acidosis, precedex need  PRN. Unfortunately with mildly elevated PCT & significant leukocytosis, need to consider possibility of infection. Pneumonia could be a potential source as well as bacteremia. -Blood cultures pending -Continue empiric antibiotics-Zosyn and vancomycin. De-escalate as able. -Continue norepinephrine as required to maintain MAP greater than 65.  Start low-dose midodrine today. - Hold PTA antihypertensives.  ESRD on HD TThS; last HD on 10/3 Hyperphosphatemia -Strict I/os-renally dose meds and avoid nephrotoxic meds - Start low-dose midodrine - Okay to do dialysis on low-dose peripheral pressors.  DM -Start Semglee 15 units daily -Start insulin aspart 4 units 3 times daily AC - Sliding scale insulin  HTN HLD CAD- recent left heart cath with LMCx 40%, LAD 10%  HFrEF  -Can resume Crestor, continue to hold Coreg and nitro  Hypothyroidism; TSH WNL -PTA Synthroid  Acute metabolic encephalopathy, presumed to be due to infection since she improved after starting antibiotics more than with bipap.  -Passed swallow study -PT, OT   Son updated via phone.  He is planning to come visit today.  Best Practice (right click and "Reselect all SmartList Selections" daily)   Diet/type: Regular consistency (see orders) DVT prophylaxis: subQ heparin GI prophylaxis: n/a Lines: n/a Foley:  n/a Code Status:  full code  Last date of multidisciplinary goals of care discussion [10/5 son updated]  Labs   CBC: Recent Labs  Lab 07/11/23 1321 07/11/23 1333 07/11/23 1334 07/11/23 1922 07/12/23 0308 07/12/23 0748 07/12/23 1410  WBC 10.8*  --   --   --  19.3*  --   --   NEUTROABS 9.0*  --   --   --   --   --   --   HGB 11.8*   < > 13.9 12.9 12.2 13.3 12.9  HCT 42.1   < > 41.0 38.0 44.3 39.0 38.0  MCV 112.6*  --   --   --  110.8*  --   --   PLT 237  --   --   --  245  --   --    < > = values in this interval not displayed.    Basic Metabolic Panel: Recent Labs  Lab 07/11/23 1321  07/11/23 1333 07/11/23 1334 07/11/23 1922 07/12/23 0308 07/12/23 0748 07/12/23 1410 07/12/23 1749 07/13/23 0753  NA 138 139 137 138 136 133* 133*  --   --   K 4.8 4.7 4.7 4.8 4.5 4.3 4.4  --   --   CL 95* 99  --   --  93*  --   --   --   --   CO2 31  --   --   --  28  --   --   --   --   GLUCOSE 145* 144*  --   --  149*  --   --   --   --   BUN 28* 33*  --   --  21  --   --   --   --   CREATININE 4.92* 5.10*  --   --  3.86*  --   --   --   --   CALCIUM 9.0  --   --   --  9.2  --   --   --   --  MG  --   --   --   --  2.2  --   --  2.1 2.1  PHOS  --   --   --   --   --   --   --  5.6* 5.4*   GFR: Estimated Creatinine Clearance: 15.1 mL/min (A) (by C-G formula based on SCr of 3.86 mg/dL (H)). Recent Labs  Lab 07/11/23 1321 07/11/23 1334 07/11/23 1611 07/12/23 0308  PROCALCITON  --   --  0.52  --   WBC 10.8*  --   --  19.3*  LATICACIDVEN  --  0.9  --   --     Liver Function Tests: Recent Labs  Lab 07/11/23 1321  AST 23  ALT 13  ALKPHOS 81  BILITOT 0.4  PROT 7.4  ALBUMIN 3.6    ABG    Component Value Date/Time   PHART 7.190 (LL) 07/12/2023 1410   PCO2ART 83.3 (HH) 07/12/2023 1410   PO2ART 167 (H) 07/12/2023 1410   HCO3 31.5 (H) 07/12/2023 1410   TCO2 34 (H) 07/12/2023 1410   O2SAT 99 07/12/2023 1410        Critical care time:     This patient is critically ill with multiple organ system failure which requires frequent high complexity decision making, assessment, support, evaluation, and titration of therapies. This was completed through the application of advanced monitoring technologies and extensive interpretation of multiple databases. During this encounter critical care time was devoted to patient care services described in this note for 33 minutes.  Steffanie Dunn, DO 07/13/23 10:39 AM [facility]  For contact information, see Amion. If no response to pager, please call PCCM consult pager. After hours, 7PM- 7AM, please call  Elink.

## 2023-07-13 NOTE — Progress Notes (Signed)
eLink Physician-Brief Progress Note Patient Name: [name] [name] DOB: 09-02-1951 MRN: [id number]   Date of Service  07/13/2023  HPI/Events of Note  No new EKG changes, troponin mildly elevated up to 158  eICU Interventions  Trend troponins till nadir     Intervention Category Minor Interventions: Routine modifications to care plan (e.g. PRN medications for pain, fever)  [place] Depaz 07/13/2023, 9:09 PM

## 2023-07-13 NOTE — Progress Notes (Signed)
There was consult for USGPIV access for vasopressor infusion. No suitable veins for PIV access at all. Lt. Arm is restricted due to fistular and Rt. Arm was swollen. Informed patient's RN regarding this founding. No PICC line for this patient unless nephrologist say okay due to lower GFR. HS [employer]

## 2023-07-13 NOTE — Progress Notes (Signed)
Pharmacy Antibiotic Note  [name] [name] is a 72 y.o. female admitted on 07/11/2023 with pneumonia.  Pharmacy has been consulted for Vancomycin and Zosyn dosing. ESRD with HD - last HD 10/3 then respiratory distress currently on HFNC.   Plan: Zosyn 2.25g IV every 8 hours.  Vancomycin 1000 mg IV x1 after HD today, then follow-up future HD plans.  Monitor culture results, clinical status, and HD plans.   Weight: 102.9 kg (226 lb 13.7 oz)  Temp (24hrs), Avg:98.6 F (37 C), Min:98 F (36.7 C), Max:100.2 F (37.9 C)  Recent Labs  Lab 07/11/23 1321 07/11/23 1333 07/11/23 1334 07/12/23 0308  WBC 10.8*  --   --  19.3*  CREATININE 4.92* 5.10*  --  3.86*  LATICACIDVEN  --   --  0.9  --     Estimated Creatinine Clearance: 15.1 mL/min (A) (by C-G formula based on SCr of 3.86 mg/dL (H)).    Allergies  Allergen Reactions   Nebivolol Swelling    Chest pain (reaction to Bystolic)   Zestril [Lisinopril] Swelling and Other (See Comments)   Ace Inhibitors Swelling and Other (See Comments)    Tongue swell   Morphine And Codeine Itching    Antimicrobials this admission: Zosyn 10/4 >> Vancomycin 10/4 >>  Dose adjustments this admission:  Microbiology results: 10/4 BCx:  10/3 MRSA PCR: negative  Thank you for allowing pharmacy to be a part of this patient's care.  Cedric Fishman, PharmD, BCPS, BCCCP Clinical Pharmacist

## 2023-07-13 NOTE — Progress Notes (Signed)
   07/13/23 0012  BiPAP/CPAP/SIPAP  $ Non-Invasive Ventilator  Non-Invasive Vent Subsequent  $ Face Mask Large  Yes  BiPAP/CPAP/SIPAP Pt Type Adult  BiPAP/CPAP/SIPAP V60  Mask Type Full face mask  Mask Size Extra large  Set Rate 20 breaths/min  Respiratory Rate 24 breaths/min  IPAP 22 cmH20  EPAP 10 cmH2O  FiO2 (%) 40 %  Flow Rate 0 lpm  Minute Ventilation 20.4  Leak 9  Peak Inspiratory Pressure (PIP) 23  Tidal Volume (Vt) 853  Patient Home Equipment No  Auto Titrate No  Press High Alarm 30 cmH2O  Press Low Alarm 5 cmH2O  CPAP/SIPAP surface wiped down Yes  Oxygen Percent 40 %  BiPAP/CPAP /SiPAP Vitals  Bilateral Breath Sounds Diminished

## 2023-07-13 NOTE — Progress Notes (Signed)
[name].   Patient currently with only one PIV access and she is complaining of pain at IV site.  IV watch in place and has not alarmed with concerns of infiltration.  RN assessed PIV site,  arm above PIV site very tender/painful to RN's touch.  No outer signs of color change.  Unclear if there is swelling at site, site is soft not firm however due to anatomy difficult to fully determine if swelling is or is not present.   The IV does have blood return when flushed.  IV team consulted but unable to obtain new PIV site.  NP [name] [name] notified and will send night team to assess for possible central line placement.

## 2023-07-13 NOTE — Progress Notes (Addendum)
Patient is c/o chest heaviness  She looks comfortable sitting in the bed She has point tenderness on Rr side of chest and she feels its same type of pain she is feeling but with increased intensity when pressed.  EKG showing SR with LBBB, which is same as previous EKGs  Trops were sent  A/P: I believe her chest is pain is due to inflamatory condition considering it gets worse with palpation and EKG showing no new changes.  Will f/u troponins.   Cheri Fowler, MD

## 2023-07-13 NOTE — Progress Notes (Signed)
   07/13/23 1900  Vitals  Temp 98.6 F (37 C)  Temp Source Tympanic  BP 109/71  MAP (mmHg) 84  BP Location Right Leg  BP Method Automatic  Patient Position (if appropriate) Lying  Pulse Rate 97  Pulse Rate Source Monitor  ECG Heart Rate 97  Resp (!) 25  Oxygen Therapy  SpO2 97 %  O2 Device HFNC  O2 Flow Rate (L/min) 6 L/min  Post Treatment  Dialyzer Clearance Lightly streaked  Hemodialysis Intake (mL) 0 mL  Liters Processed 78  Fluid Removed (mL) 2500 mL  Tolerated HD Treatment Yes  Post-Hemodialysis Comments HD tx completed goal met  AVG/AVF Arterial Site Held (minutes) 10 minutes  AVG/AVF Venous Site Held (minutes) 10 minutes  Note  Patient Observations alert no s/s of disltress  Fistula / Graft Left Upper arm   Placement Date/Time: 04/05/20 1300   Placed prior to admission: Yes  Orientation: Left  Access Location: Upper arm  Access Type: (c)   Site Condition No complications  Status Deaccessed  Drainage Description None   Received patient in bed to unit.  Alert and oriented.  Informed consent signed and in chart.   TX duration:  Patient tolerated well.  Transported back to the room  Alert, without acute distress.  Hand-off given to patient's nurse.   Access used: LUA graft Access issues: high Venous pressures  Total UF removed: 2500 Medication(s) given: none Post HD VS: see above Post HD weight: 99.5kg   [facility]

## 2023-07-13 NOTE — Progress Notes (Signed)
[facility] Progress Note  Subjective: seen in ICU, pt alert and awake. Was started on IV abx yesterday. Looks much better.   Vitals:   07/13/23 1015 07/13/23 1030 07/13/23 1045 07/13/23 1100  BP: (!) 101/56 107/60 (!) 78/44 (!) 104/59  Pulse: 74 83 87 76  Resp: 18 16 15 17   Temp:      TempSrc:      SpO2: 97% 95% 97% 98%  Weight:        Exam: Gen [place] O2, obese, on [place] O2 No jvd or bruits Chest clear bilat  RRR no MRG Abd soft ntnd no mass or ascites +bs Ext 1+ bilat pretib edema    LUA AVG+bruit     Renal-related home meds: - coreg 6.25 bid - home O2  - pregabalin 25 every day - renvela 2 ac tid        OP HD: [facility] 4h   400/600  99kg  2/2 bath   aVG   Heparin 2000+ 2000 midrun - last HD 10/3 today, post wt 106kg - 3wks ago coming off 3 over, this last week coming off 7kg up - hectorol 3 mcg  - venofer 100mg  three times per week, thru 10/05 - mircera 50 mcg IV q 2 wks, last 10/3, due 10/17     Assessment/ Plan: ESRD - on HD TTS. Had HD at OP unit on Thursday. We did extra HD overnight last night due to resp failure and signs of pulm edema on CXR. Got 2.5 L off w/ HD Thursday.  Volume overload - is 3-4 kg up, goal UF 2-3 L w/ HD today. Use pressor support as needed.  BP - home coreg on hold. BP's soft here, on levo gtt. Midodrine added at 5 tid.  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 No esa needs at this time.  MBD ckd - CCa and phos are in range. Cont IV vdra and renvela as binder.  DNR      [name] MD  [facility] 07/13/2023, 11:20 AM  Recent Labs  Lab 07/11/23 1321 07/11/23 1333 07/11/23 1334 07/12/23 0308 07/12/23 0748 07/12/23 1410 07/12/23 1749 07/13/23 0753  HGB 11.8* 14.3   < > 12.2 13.3 12.9  --   --   ALBUMIN 3.6  --   --   --   --   --   --   --   CALCIUM 9.0  --   --  9.2  --   --   --   --   PHOS  --   --   --   --   --   --  5.6* 5.4*  CREATININE 4.92* 5.10*  --  3.86*  --   --   --   --   K 4.8 4.7   < > 4.5 4.3 4.4  --   --     < > = values in this interval not displayed.   No results for input(s): "IRON", "TIBC", "FERRITIN" in the last 168 hours. Inpatient medications:  arformoterol  15 mcg Nebulization Q12H   budesonide (PULMICORT) nebulizer solution  0.5 mg Nebulization BID   Chlorhexidine Gluconate Cloth  6 each Topical Q0600   feeding supplement (PROSource TF20)  60 mL Per Tube Daily   heparin  2,000 Units Dialysis Once in dialysis   heparin  5,000 Units Subcutaneous Q8H   hydrocortisone sod succinate (SOLU-CORTEF) inj  100 mg Intravenous Q12H   insulin aspart  0-20 Units Subcutaneous TID WC  insulin aspart  4 Units Subcutaneous TID WC   insulin glargine-yfgn  15 Units Subcutaneous Daily   midodrine  5 mg Oral Q8H   mouth rinse  15 mL Mouth Rinse 4 times per day   revefenacin  175 mcg Nebulization Daily   rosuvastatin  20 mg Oral Daily   vancomycin variable dose per unstable renal function (pharmacist dosing)   Does not apply See admin instructions    sodium chloride     albumin human     anticoagulant sodium citrate     feeding supplement (VITAL AF 1.2 CAL) Stopped (07/13/23 1031)   norepinephrine (LEVOPHED) Adult infusion 4 mcg/min (07/13/23 1100)   piperacillin-tazobactam (ZOSYN)  IV Stopped (07/13/23 0944)   vancomycin     alteplase, anticoagulant sodium citrate, docusate sodium, feeding supplement (NEPRO CARB STEADY), heparin, heparin, ipratropium-albuterol, lidocaine (PF), lidocaine-prilocaine, mouth rinse, pentafluoroprop-tetrafluoroeth, polyethylene glycol, white petrolatum

## 2023-07-14 DIAGNOSIS — R079 Chest pain, unspecified: Secondary | ICD-10-CM | POA: Diagnosis not present

## 2023-07-14 DIAGNOSIS — N186 End stage renal disease: Secondary | ICD-10-CM | POA: Diagnosis not present

## 2023-07-14 DIAGNOSIS — A419 Sepsis, unspecified organism: Secondary | ICD-10-CM | POA: Diagnosis not present

## 2023-07-14 DIAGNOSIS — J9602 Acute respiratory failure with hypercapnia: Secondary | ICD-10-CM | POA: Diagnosis not present

## 2023-07-14 LAB — CBC
HCT: 34.2 % — ABNORMAL LOW (ref 36.0–46.0)
Hemoglobin: 10.3 g/dL — ABNORMAL LOW (ref 12.0–15.0)
MCH: 31.2 pg (ref 26.0–34.0)
MCHC: 30.1 g/dL (ref 30.0–36.0)
MCV: 103.6 fL — ABNORMAL HIGH (ref 80.0–100.0)
Platelets: 269 10*3/uL (ref 150–400)
RBC: 3.3 MIL/uL — ABNORMAL LOW (ref 3.87–5.11)
RDW: 14.5 % (ref 11.5–15.5)
WBC: 12.8 10*3/uL — ABNORMAL HIGH (ref 4.0–10.5)
nRBC: 2.3 % — ABNORMAL HIGH (ref 0.0–0.2)

## 2023-07-14 LAB — RENAL FUNCTION PANEL
Albumin: 3 g/dL — ABNORMAL LOW (ref 3.5–5.0)
Anion gap: 12 (ref 5–15)
BUN: 40 mg/dL — ABNORMAL HIGH (ref 8–23)
CO2: 30 mmol/L (ref 22–32)
Calcium: 9.3 mg/dL (ref 8.9–10.3)
Chloride: 90 mmol/L — ABNORMAL LOW (ref 98–111)
Creatinine, Ser: 4.12 mg/dL — ABNORMAL HIGH (ref 0.44–1.00)
GFR, Estimated: 11 mL/min — ABNORMAL LOW (ref >60)
Glucose, Bld: 283 mg/dL — ABNORMAL HIGH (ref 70–99)
Phosphorus: 3 mg/dL (ref 2.5–4.6)
Potassium: 3.6 mmol/L (ref 3.5–5.1)
Sodium: 132 mmol/L — ABNORMAL LOW (ref 135–145)

## 2023-07-14 LAB — TROPONIN I (HIGH SENSITIVITY): Troponin I (High Sensitivity): 150 ng/L (ref <18)

## 2023-07-14 LAB — GLUCOSE, CAPILLARY
Glucose-Capillary: 247 mg/dL — ABNORMAL HIGH (ref 70–99)
Glucose-Capillary: 272 mg/dL — ABNORMAL HIGH (ref 70–99)
Glucose-Capillary: 262 mg/dL — ABNORMAL HIGH (ref 70–99)

## 2023-07-14 MED ORDER — INSULIN GLARGINE-YFGN 100 UNIT/ML ~~LOC~~ SOLN
25.0000 [IU] | Freq: Every day | SUBCUTANEOUS | Status: DC
  Administered 2023-07-14: 25 [IU] via SUBCUTANEOUS
  Filled 2023-07-14 (×2): qty 0.25

## 2023-07-14 MED ORDER — MELATONIN 3 MG PO TABS
3.0000 mg | ORAL_TABLET | Freq: Every day | ORAL | Status: DC
  Administered 2023-07-14 – 2023-07-19 (×6): 3 mg via ORAL
  Filled 2023-07-14 (×6): qty 1

## 2023-07-14 MED ORDER — ASPIRIN 81 MG PO CHEW: 81.0000 mg | CHEWABLE_TABLET | Freq: Every day | ORAL | Status: DC

## 2023-07-14 MED ORDER — ROSUVASTATIN CALCIUM 20 MG PO TABS
20.0000 mg | ORAL_TABLET | Freq: Every day | ORAL | Status: DC
  Administered 2023-07-14 – 2023-07-19 (×5): 20 mg via ORAL
  Filled 2023-07-14 (×5): qty 1

## 2023-07-14 MED ORDER — INSULIN ASPART 100 UNIT/ML IJ SOLN
10.0000 [IU] | Freq: Three times a day (TID) | INTRAMUSCULAR | Status: DC
  Administered 2023-07-14 – 2023-07-16 (×3): 10 [IU] via SUBCUTANEOUS

## 2023-07-14 MED ORDER — ORAL CARE MOUTH RINSE: 15.0000 mL | OROMUCOSAL | Status: DC | PRN

## 2023-07-14 MED ORDER — LINEZOLID 600 MG PO TABS
600.0000 mg | ORAL_TABLET | Freq: Two times a day (BID) | ORAL | Status: AC
  Administered 2023-07-14 – 2023-07-18 (×9): 600 mg via ORAL
  Filled 2023-07-14 (×11): qty 1

## 2023-07-14 MED ORDER — INSULIN ASPART 100 UNIT/ML IJ SOLN: 7.0000 [IU] | Freq: Three times a day (TID) | INTRAMUSCULAR | Status: DC

## 2023-07-14 MED ORDER — CLOPIDOGREL BISULFATE 75 MG PO TABS
75.0000 mg | ORAL_TABLET | Freq: Every day | ORAL | Status: DC
  Administered 2023-07-14 – 2023-07-19 (×6): 75 mg via ORAL
  Filled 2023-07-14 (×6): qty 1

## 2023-07-14 NOTE — Evaluation (Signed)
Physical Therapy Evaluation Patient Details Name: [name] [name] MRN: [id number] DOB: January 19, 1951 Today's Date: 07/14/2023  History of Present Illness  72 yo female admitted 10/3 with AMS after HD, pt hypotensive requiring pressor support, shock. 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 Pt live with son with PCA, hospital bed and hoyer lift. Pt has not been strong enough to stand since Aug but reports desire to. Pt on 4L with activity with SPO2 93%, decreased activity tolerance and function. Pt will benefit from acute therapy to maximize strength and function with return home and continuation of HHPT without further DME needs at this time.         If plan is discharge home, recommend the following: A lot of help with bathing/dressing/bathroom;A lot of help with walking and/or transfers;Assistance with cooking/housework;Assist for transportation   Can travel by private vehicle        Equipment Recommendations None recommended by PT  Recommendations for Other Services       Functional Status Assessment Patient has had a recent decline in their functional status and/or demonstrates limited ability to make significant improvements in function in a reasonable and predictable amount of time     Precautions / Restrictions Precautions Precautions: Fall;Other (comment) Precaution Comments: watch sats and bp Restrictions Weight Bearing Restrictions: No      Mobility  Bed Mobility Overal bed mobility: Needs Assistance Bed Mobility: Supine to Sit     Supine to sit: HOB elevated, Used rails     General bed mobility comments: HOB 40 degrees with mod assist to pivot to left side of bed with cues for hand placement and sequence. Max +2 to return to supine. total +2 to slide up in bed. Pt positioned in chair position  end of session    Transfers                   General transfer comment: pt unable to stand from elevated bed with +2 assist and pad. lateral scoot with max +2 and assist of pad    Ambulation/Gait               General Gait Details: unable at baseline  Stairs            Wheelchair Mobility     Tilt Bed    Modified Rankin (Stroke Patients Only)       Balance Overall balance assessment: Needs assistance   Sitting balance-Leahy Scale: Fair                                       Pertinent Vitals/Pain Pain Assessment Pain Assessment: No/denies pain    Home Living Family/patient expects to be discharged to:: Private residence Living Arrangements: Children Available Help at Discharge: Family;Available PRN/intermittently;Personal care attendant Type of Home: Apartment Home Access: Ramped entrance       Home Layout: One level Home Equipment: Tub bench;Grab bars - tub/shower;Grab bars - toilet;Rolling Walker (2 wheels);BSC/3in1;Hospital bed;Wheelchair - Engineer, technical sales - power;Other (comment) Additional Comments: hoyer lift. has been unable to stand since August. hoyer lift to [facility]. PCA 6 days a week    Prior Function Prior Level of Function : Needs assist       Physical  Assist : Mobility (physical);ADLs (physical) Mobility (physical): Bed mobility;Transfers;Gait ADLs (physical): Bathing;Dressing;Toileting;IADLs Mobility Comments: hoyer for OOB, son assists with rolling ADLs Comments: PCA helps with ADLs and IADLs     Extremity/Trunk Assessment   Upper Extremity Assessment Upper Extremity Assessment: Defer to OT evaluation    Lower Extremity Assessment Lower Extremity Assessment: Generalized weakness;RLE deficits/detail;LLE deficits/detail RLE Deficits / Details: grossly 2/5 knee flexion and extension LLE Deficits / Details: grossly 2/5 knee flexion and extension    Cervical / Trunk Assessment Cervical / Trunk Assessment:  Kyphotic  Communication   Communication Communication: No apparent difficulties  Cognition Arousal: Alert Behavior During Therapy: WFL for tasks assessed/performed Overall Cognitive Status: Within Functional Limits for tasks assessed                                          General Comments      Exercises General Exercises - Lower Extremity Short Arc Quad: AROM, Both, Seated, 10 reps   Assessment/Plan    PT Assessment Patient needs continued PT services  PT Problem List Decreased strength;Decreased mobility;Decreased activity tolerance;Decreased balance;Decreased knowledge of use of DME;Obesity       PT Treatment Interventions DME instruction;Gait training;Functional mobility training;Therapeutic activities;Patient/family education;Balance training;Therapeutic exercise    PT Goals (Current goals can be found in the Care Plan section)  Acute Rehab PT Goals Patient Stated Goal: be able to stand PT Goal Formulation: With patient Time For Goal Achievement: 07/28/23 Potential to Achieve Goals: Poor    Frequency Min 1X/week     Co-evaluation               AM-PAC PT "6 Clicks" Mobility  Outcome Measure Help needed turning from your back to your side while in a flat bed without using bedrails?: A Lot Help needed moving from lying on your back to sitting on the side of a flat bed without using bedrails?: A Lot Help needed moving to and from a bed to a chair (including a wheelchair)?: Total Help needed standing up from a chair using your arms (e.g., wheelchair or bedside chair)?: Total Help needed to walk in hospital room?: Total Help needed climbing 3-5 steps with a railing? : Total 6 Click Score: 8    End of Session Equipment Utilized During Treatment: Oxygen Activity Tolerance: Patient tolerated treatment well Patient left: in bed;with call bell/phone within reach;with bed alarm set Nurse Communication: Mobility status;Need for lift equipment PT  Visit Diagnosis: Other abnormalities of gait and mobility (R26.89);Muscle weakness (generalized) (M62.81)    Time: 1610-9604 PT Time Calculation (min) (ACUTE ONLY): 20 min   Charges:   PT Evaluation $PT Eval Moderate Complexity: 1 Mod   PT General Charges $$ ACUTE PT VISIT: 1 Visit         Merryl Hacker, PT Acute Rehabilitation Services Office: [phone]   Enedina Finner Tabbitha Janvrin 07/14/2023, 10:42 AM

## 2023-07-14 NOTE — Evaluation (Signed)
Occupational Therapy Evaluation Patient Details Name: [name] [name] MRN: [id number] DOB: 08/25/1951 Today's Date: 07/14/2023   History of Present Illness 72 yo female admitted 10/3 with AMS after HD, pt hypotensive requiring pressor support, shock. 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 Pt will benefit from continued acute OT to address the below listed deficits and maximize independence with basic ADLs prior to d/c. Pt lived with her son and has a PCA 6 days/wk, 3 hours per day. PTA pt has been utilizing [employer] lift for OOB transfers; has hospital bed. She endorses not being able to stand since August. Pt on 4L during session with SpO2 93. Pt currently needs max A with LB ADLs in sitting/lateral lean or bed level; mod A with UB ADLs. Pt sat EOB several minutes, fair sitting balance.        If plan is discharge home, recommend the following:      Functional Status Assessment  Patient has had a recent decline in their functional status and demonstrates the ability to make significant improvements in function in a reasonable and predictable amount of time.  Equipment Recommendations  None recommended by OT    Recommendations for Other Services       Precautions / Restrictions Precautions Precautions: Fall;Other (comment) Precaution Comments: watch sats and bp Restrictions Weight Bearing Restrictions: No      Mobility Bed Mobility Overal bed mobility: Needs Assistance Bed Mobility: Supine to Sit     Supine to sit: HOB elevated, Used rails     General bed mobility comments: HOB 40 degrees with mod assist to pivot to left side of bed with cues for hand placement and sequence. Max +2 to return to supine. total +2 to slide up in bed. Pt positioned in chair position end of session    Transfers                    General transfer comment: pt unable to stand from elevated bed with +2 assist and pad. lateral scoot with max +2 and assist of pad      Balance Overall balance assessment: Needs assistance   Sitting balance-Leahy Scale: Fair                                     ADL either performed or assessed with clinical judgement   ADL Overall ADL's : Needs assistance/impaired Eating/Feeding: Set up;Sitting   Grooming: Set up;Sitting   Upper Body Bathing: Sitting;Moderate assistance   Lower Body Bathing: Maximal assistance;Sitting/lateral leans   Upper Body Dressing : Moderate assistance;Sitting   Lower Body Dressing: Maximal assistance;Sitting/lateral leans                 General ADL Comments: Able to sit EOB several minutes, fair balance. poor ability to scoot, unable to clear hips when attempted to stand with +2 assist     Vision         Perception         Praxis         Pertinent Vitals/Pain Pain Assessment Pain Assessment: No/denies pain     Extremity/Trunk Assessment Upper Extremity Assessment Upper Extremity Assessment: Generalized weakness;Right hand dominant;RUE deficits/detail RUE Deficits / Details: chronic R shoulder impairment, uses  nondominant arm for grooming tasks.   Lower Extremity Assessment Lower Extremity Assessment: Defer to PT evaluation RLE Deficits / Details: grossly 2/5 knee flexion and extension LLE Deficits / Details: grossly 2/5 knee flexion and extension   Cervical / Trunk Assessment Cervical / Trunk Assessment: Kyphotic   Communication Communication Communication: No apparent difficulties   Cognition Arousal: Alert Behavior During Therapy: WFL for tasks assessed/performed Overall Cognitive Status: Within Functional Limits for tasks assessed                                       General Comments       Exercises     Shoulder Instructions      Home Living Family/patient expects to be  discharged to:: Private residence Living Arrangements: Children Available Help at Discharge: Family;Available PRN/intermittently;Personal care attendant Type of Home: Apartment Home Access: Ramped entrance     Home Layout: One level     Bathroom Shower/Tub: Producer, television/film/video: Handicapped height Bathroom Accessibility: Yes How Accessible: Accessible via walker Home Equipment: Tub bench;Grab bars - tub/shower;Grab bars - toilet;Rolling Walker (2 wheels);BSC/3in1;Hospital bed;Wheelchair - Engineer, technical sales - power;Other (comment)   Additional Comments: hoyer lift. has been unable to stand since August. hoyer lift to [facility]. PCA 6 days a week      Prior Functioning/Environment Prior Level of Function : Needs assist       Physical Assist : Mobility (physical);ADLs (physical) Mobility (physical): Bed mobility;Transfers;Gait ADLs (physical): Bathing;Dressing;Toileting;IADLs Mobility Comments: hoyer for OOB, son assists with rolling ADLs Comments: PCA helps with ADLs and IADLs        OT Problem List: Decreased strength;Decreased activity tolerance;Impaired balance (sitting and/or standing);Decreased knowledge of use of DME or AE;Decreased knowledge of precautions;Cardiopulmonary status limiting activity;Obesity      OT Treatment/Interventions: Self-care/ADL training;Therapeutic exercise;Energy conservation;DME and/or AE instruction;Therapeutic activities;Patient/family education;Balance training    OT Goals(Current goals can be found in the care plan section) Acute Rehab OT Goals Patient Stated Goal: would like to be able to stand again OT Goal Formulation: With patient Time For Goal Achievement: 07/28/23 Potential to Achieve Goals: Good ADL Goals Pt Will Perform Upper Body Dressing: with min assist;sitting;with set-up Pt Will Perform Lower Body Dressing: with mod assist;sitting/lateral leans Pt Will Transfer to Toilet:  (n/a) Additional ADL Goal #1: Pt will  complete bed mobility at max A +1to prepare for EOB/OOB ADLs.  OT Frequency: Min 2X/week    Co-evaluation PT/OT/SLP Co-Evaluation/Treatment: Yes Reason for Co-Treatment: Complexity of the patient's impairments (multi-system involvement);For patient/therapist safety;To address functional/ADL transfers   OT goals addressed during session: ADL's and self-care      AM-PAC OT "6 Clicks" Daily Activity     Outcome Measure Help from another person eating meals?: None Help from another person taking care of personal grooming?: A Little Help from another person toileting, which includes using toliet, bedpan, or urinal?: Total Help from another person bathing (including washing, rinsing, drying)?: A Lot Help from another person to put on and taking off regular upper body clothing?: A Little Help from another person to put on and taking off regular lower body clothing?: Total 6 Click Score: 14   End of Session Equipment Utilized During Treatment: Oxygen Nurse Communication: Mobility status  Activity Tolerance: Patient tolerated treatment well Patient left: in bed;with call bell/phone within reach;with bed alarm set (bed in chair position)  OT Visit Diagnosis: Unsteadiness on feet (R26.81);Muscle  weakness (generalized) (M62.81);Other abnormalities of gait and mobility (R26.89)                Time: 1610-9604 OT Time Calculation (min): 20 min Charges:  OT General Charges $OT Visit: 1 Visit OT Evaluation $OT Eval Moderate Complexity: 1 Mod  Raynald Kemp, OT Acute Rehabilitation Services Office: [phone]   Pilar Grammes 07/14/2023, 11:20 AM

## 2023-07-14 NOTE — Progress Notes (Signed)
eLink Physician-Brief Progress Note Patient Name: [name] [name] DOB: [birth year] MRN: [id number]   Date of Service  07/14/2023  HPI/Events of Note  Insomnia  eICU Interventions  Placed order for melatonin 3mg  PO nightly PRN.         [name] M DELA CRUZ 07/14/2023, 8:48 PM

## 2023-07-14 NOTE — Plan of Care (Signed)
Received care of pt alert and oriented x4. Midodrine effective, Levo titrated down. Pt turned and positioned q2h. Pt critical and sarcastic with various staff members who enter her room and greet her. Mocking them, after they say "hello" or "good bye". Writer explained to patient that staff members are here to help her and that it's in appropriate for her to be mean or disrespectful to staff members who are here to care for her. Pt responded by smirking, but said nothing. No further incident noted. Requests addressed in a timely manner , I.e. requests for multiple warm blankets or later to remove blankets because she is too hot. IV site remains soft and palpable, with IV watch intact. Will continue to monitor.

## 2023-07-14 NOTE — Progress Notes (Signed)
[facility] Progress Note  Subjective: seen in ICU. Alert, no c/o's.  UF 2.5 L w/ HD yesterday.   Vitals:   07/14/23 1100 07/14/23 1153 07/14/23 1200 07/14/23 1300  BP: 115/82  (!) 105/37 98/67  Pulse: 95  85 79  Resp: 18  20 16   Temp:  98.5 F (36.9 C)    TempSrc:  Oral    SpO2: 95%  98% 94%  Weight:        Exam: Gen [place] O2, obese, on [place] O2 No jvd or bruits Chest clear bilat  RRR no MRG Abd soft ntnd no mass or ascites +bs Ext no LE edema    LUA AVG+bruit     Renal-related home meds: - coreg 6.25 bid - home O2  - pregabalin 25 every day - renvela 2 ac tid        OP HD: [facility] 4h   400/600  99kg  2/2 bath   aVG   Heparin 2000+ 2000 midrun - last HD 10/3 today, post wt 106kg - 3wks ago coming off 3 over, this last week coming off 7kg up - hectorol 3 mcg  - venofer 100mg  three times per week, thru 10/05 - mircera 50 mcg IV q 2 wks, last 10/3, due 10/17     Assessment/ Plan: ESRD - on HD TTS. Had HD at OP unit on Thursday. Had HD here Thurs, Fri and Saturday. Next HD Tuesday.  Volume overload - looks euvolemic on exam. Wt's today are pending.  Hypotension - home coreg on hold. Close to weaning off of levo gtt, per CCM. Midodrine ^'d to 15 tid.  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 No esa needs at this time.  MBD ckd - CCa and phos are in range. Cont IV vdra and renvela as binder.  DNR      [name] MD  [facility] 07/14/2023, 3:24 PM  Recent Labs  Lab 07/12/23 1410 07/12/23 1749 07/13/23 1505 07/14/23 0244  HGB 12.9  --   --  10.3*  ALBUMIN  --   --  2.9* 3.0*  CALCIUM  --   --  9.2 9.3  PHOS  --    < > 5.3* 3.0  CREATININE  --   --  5.45* 4.12*  K 4.4  --  4.1 3.6   < > = values in this interval not displayed.   No results for input(s): "IRON", "TIBC", "FERRITIN" in the last 168 hours. Inpatient medications:  amoxicillin-clavulanate  1 tablet Per Tube Q24H   arformoterol  15 mcg Nebulization Q12H   budesonide (PULMICORT) nebulizer solution   0.5 mg Nebulization BID   Chlorhexidine Gluconate Cloth  6 each Topical Q0600   clopidogrel  75 mg Oral Daily   heparin  2,000 Units Dialysis Once in dialysis   heparin  5,000 Units Subcutaneous Q8H   hydrocortisone sod succinate (SOLU-CORTEF) inj  100 mg Intravenous Q12H   insulin aspart  0-20 Units Subcutaneous TID WC   insulin aspart  10 Units Subcutaneous TID WC   insulin glargine-yfgn  25 Units Subcutaneous Daily   levothyroxine  175 mcg Oral Q0600   linezolid  600 mg Oral Q12H   midodrine  15 mg Oral Q8H   revefenacin  175 mcg Nebulization Daily   rosuvastatin  20 mg Oral Daily    anticoagulant sodium citrate     norepinephrine (LEVOPHED) Adult infusion Stopped (07/14/23 0806)   alteplase, anticoagulant sodium citrate, docusate sodium, heparin, heparin, ipratropium-albuterol, lidocaine (PF),  lidocaine-prilocaine, mouth rinse, pentafluoroprop-tetrafluoroeth, polyethylene glycol, white petrolatum

## 2023-07-14 NOTE — Progress Notes (Addendum)
NAME:  [name] [name], MRN:  [id number], DOB:  06-15-1951, LOS: 3 ADMISSION DATE:  07/11/2023, CONSULTATION DATE:  07/11/2023 REFERRING MD:  Johny Blamer, IM, CHIEF COMPLAINT:  shortness of breath    History of Present Illness:  72 year old female with past medical history of TIA, HTN, HLD, GERD, DM, COPD on 3LNC, obesity, ESRD on HD TThS, recent catheterization who presents to the emergency department after dialysis for altered mental status. Per EDP, patient completed dialysis today. She was drowsy at completion of treatment and was sent to ED. On arrival, patient was obtunded, requiring significant tactile stimulation to awaken. Patient was placed on BiPAP which improved her mental status. ABG and CXR obtained showing fluid overload, and significant acidosis with pH 7.169, pCO2 97.6, pO2 54. After about 30 minutes of BiPAP treatment patient took herself off, refusing to wear it again. She was noted to be coughing up clear sputum. Hospitalist was consulted and subsequently PCCM consulted for severe acidosis. Labs in ED notable for WBC 10.8, BUN 33, Cr 5.10, pH as above, troponin 90, lactic negative. CXR with pulmonary congestion, CT head and CT chest are pending at time of evaluation.   On my exam, the patient is awake, alert, asking to go home or eat. She is not very sure why she needs admission or further BiPAP at this time. She denies feeling sick in the past few days. She does endorse being compliant on her dialysis. Notes she wears 3LNC which is her baseline and has not had to increase recently. She endorses compliance with medications. She denies new cough or fever, n/v/d.   After exam, PCCM was re-consulted as patient became obtunded again, hypotensive requiring peripheral levo and was admitted to [facility].   Pertinent  Medical History  TIA, HTN, HLD, GERD, DM, COPD on 3LNC, obesity, ESRD on HD TThS  Significant Hospital Events: Including procedures, antibiotic start and stop dates in addition to  other pertinent events   10/3: admitted to [facility]  10/4 started antibiotics,  Interim History / Subjective:  Feeling well this morning, eating well. Had an episode of chest pain yesterday, still having some. Has been having this pain for about a month.  Mains on norepinephrine 1  Objective   Blood pressure (!) 86/62, pulse 83, temperature 98.9 F (37.2 C), temperature source Oral, resp. rate 20, weight 102.9 kg, SpO2 94%.        Intake/Output Summary (Last 24 hours) at 07/14/2023 3086 Last data filed at 07/14/2023 0800 Gross per 24 hour  Intake 1298.36 ml  Output 2500 ml  Net -1201.64 ml   Filed Weights   07/12/23 0314 07/13/23 0112 07/13/23 1450  Weight: 103.2 kg 102.9 kg 102.9 kg    Examination: General: Elderly woman sitting up in bed eating breakfast in no acute distress HENT: [place]/AT, eyes anicteric Lungs: Breathing comfortably on nasal cannula, CTAB.  Reduced basilar breath sounds. Cardiovascular: S1-S2, regular rate and rhythm Abdomen: Obese, nondistended Extremities: No significant peripheral edema, no clubbing or cyanosis Neuro: Awake and alert, answering questions appropriately.  Normal speech.  Moving all extremities. Derm: Warm, dry, no diffuse rashes.  Na+ 132 BUN 40 Cr 4.12 WBC 12.8 H/H 10.3/34.2 Platelets 269 Glucose 200s Trop 158  Blood cultures-no growth x 2 days  Resolved Hospital Problem list     Assessment & Plan:  Chest pain, mild troponin elevation.  Had left heart catheterization on 07/05/2023 with nonobstructive coronary artery disease being treated with medical management. -Continue aspirin, statin.  Counseled her on the  importance of taking her statin for secondary prevention. -Suspect troponin leak is associated with acute illness.  Low suspicion that she has had rapid progression of her coronary artery disease in the last 2 weeks. -Monitor on telemetry - Monitor electrolytes and replete as needed  Acute on chronic hypercarbic, hypoxic  respiratory failure 2/2 fluid volume overload, potentially also a component of COPD exacerbation. OHS potentially contributing as well.  COPD on 3LNC  Metabolic encephalopathy may have contributed to hypoventilation. - No longer requiring BiPAP - HD per nephro - Triple inhaled therapy.  At the time of discharge can which back to Incruse and Symbicort. -Recommend outpatient polysomnogram, although she is very disinclined to use positive pressure ventilation future.  Shock-concern for septic with unknown source of infection; hypotension likely related to respiratory acidosis, precedex need PRN. Unfortunately with mildly elevated PCT & significant leukocytosis, need to consider possibility of infection. Pneumonia could be a potential source as well as bacteremia. - Continue to follow blood cultures until finalized - Continue Parikh antibiotics-linezolid and Augmentin.  Plan for 7-day course if cultures remain negative. - Wean off norepinephrine for goal SBP greater than 90. - Continue midodrine - Continue holding PTA antihypertensives.  ESRD on HD TThS; last HD on 10/3 Hyperphosphatemia - Strict I's/O - Renally dose meds and avoid nephrotoxic meds  DM - Increase Semglee 25 units daily - Insulin aspart 7 units 3 times daily before meals -Sliding scale insulin - Goal blood glucose 140 and 180  HTN HLD CAD- recent left heart cath with LMCx 40%, LAD 10%  Chronic HFrEF  - Continue Crestor.  Educated her on the importance of secondary prevention with statin therapy. -Resume home Plavix daily - Continue to hold PTA antihypertensives-Coreg  Hypothyroidism; TSH WNL -Continue PTA Synthroid  Acute metabolic encephalopathy, presumed to be due to infection since she improved after starting antibiotics more than with bipap.  -PT, OT - Take-out core track since she is eating well    Best Practice (right click and "Reselect all SmartList Selections" daily)   Diet/type: Regular consistency  (see orders) DVT prophylaxis: subQ heparin GI prophylaxis: n/a Lines: n/a Foley:  n/a Code Status:  full code  Last date of multidisciplinary goals of care discussion [10/5 son updated]  Labs   CBC: Recent Labs  Lab 07/11/23 1321 07/11/23 1333 07/11/23 1922 07/12/23 0308 07/12/23 0748 07/12/23 1410 07/14/23 0244  WBC 10.8*  --   --  19.3*  --   --  12.8*  NEUTROABS 9.0*  --   --   --   --   --   --   HGB 11.8*   < > 12.9 12.2 13.3 12.9 10.3*  HCT 42.1   < > 38.0 44.3 39.0 38.0 34.2*  MCV 112.6*  --   --  110.8*  --   --  103.6*  PLT 237  --   --  245  --   --  269   < > = values in this interval not displayed.    Basic Metabolic Panel: Recent Labs  Lab 07/11/23 1321 07/11/23 1333 07/11/23 1334 07/12/23 0308 07/12/23 0748 07/12/23 1410 07/12/23 1749 07/13/23 0753 07/13/23 1505 07/14/23 0244  NA 138 139   < > 136 133* 133*  --   --  130* 132*  K 4.8 4.7   < > 4.5 4.3 4.4  --   --  4.1 3.6  CL 95* 99  --  93*  --   --   --   --  90* 90*  CO2 31  --   --  28  --   --   --   --  25 30  GLUCOSE 145* 144*  --  149*  --   --   --   --  244* 283*  BUN 28* 33*  --  21  --   --   --   --  55* 40*  CREATININE 4.92* 5.10*  --  3.86*  --   --   --   --  5.45* 4.12*  CALCIUM 9.0  --   --  9.2  --   --   --   --  9.2 9.3  MG  --   --   --  2.2  --   --  2.1 2.1  --   --   PHOS  --   --   --   --   --   --  5.6* 5.4* 5.3* 3.0   < > = values in this interval not displayed.   GFR: Estimated Creatinine Clearance: 14.1 mL/min (A) (by C-G formula based on SCr of 4.12 mg/dL (H)). Recent Labs  Lab 07/11/23 1321 07/11/23 1334 07/11/23 1611 07/12/23 0308 07/14/23 0244  PROCALCITON  --   --  0.52  --   --   WBC 10.8*  --   --  19.3* 12.8*  LATICACIDVEN  --  0.9  --   --   --     Liver Function Tests: Recent Labs  Lab 07/11/23 1321 07/13/23 1505 07/14/23 0244  AST 23  --   --   ALT 13  --   --   ALKPHOS 81  --   --   BILITOT 0.4  --   --   PROT 7.4  --   --   ALBUMIN  3.6 2.9* 3.0*    ABG    Component Value Date/Time   PHART 7.190 (LL) 07/12/2023 1410   PCO2ART 83.3 (HH) 07/12/2023 1410   PO2ART 167 (H) 07/12/2023 1410   HCO3 31.5 (H) 07/12/2023 1410   TCO2 34 (H) 07/12/2023 1410   O2SAT 99 07/12/2023 1410        Critical care time:      This patient is critically ill with multiple organ system failure which requires frequent high complexity decision making, assessment, support, evaluation, and titration of therapies. This was completed through the application of advanced monitoring technologies and extensive interpretation of multiple databases. During this encounter critical care time was devoted to patient care services described in this note for 32 minutes.   Steffanie Dunn, DO 07/14/23 8:21 AM [facility]  For contact information, see Amion. If no response to pager, please call PCCM consult pager. After hours, 7PM- 7AM, please call Elink.

## 2023-07-15 DIAGNOSIS — A419 Sepsis, unspecified organism: Secondary | ICD-10-CM

## 2023-07-15 DIAGNOSIS — G9341 Metabolic encephalopathy: Secondary | ICD-10-CM

## 2023-07-15 DIAGNOSIS — J9621 Acute and chronic respiratory failure with hypoxia: Secondary | ICD-10-CM | POA: Diagnosis not present

## 2023-07-15 DIAGNOSIS — R6521 Severe sepsis with septic shock: Secondary | ICD-10-CM | POA: Diagnosis not present

## 2023-07-15 LAB — BASIC METABOLIC PANEL
Anion gap: 16 — ABNORMAL HIGH (ref 5–15)
BUN: 62 mg/dL — ABNORMAL HIGH (ref 8–23)
CO2: 24 mmol/L (ref 22–32)
Calcium: 9.9 mg/dL (ref 8.9–10.3)
Chloride: 92 mmol/L — ABNORMAL LOW (ref 98–111)
Creatinine, Ser: 5.5 mg/dL — ABNORMAL HIGH (ref 0.44–1.00)
GFR, Estimated: 8 mL/min — ABNORMAL LOW (ref >60)
Glucose, Bld: 175 mg/dL — ABNORMAL HIGH (ref 70–99)
Potassium: 5.1 mmol/L (ref 3.5–5.1)
Sodium: 132 mmol/L — ABNORMAL LOW (ref 135–145)

## 2023-07-15 LAB — GLUCOSE, CAPILLARY
Glucose-Capillary: 208 mg/dL — ABNORMAL HIGH (ref 70–99)
Glucose-Capillary: 185 mg/dL — ABNORMAL HIGH (ref 70–99)
Glucose-Capillary: 108 mg/dL — ABNORMAL HIGH (ref 70–99)
Glucose-Capillary: 180 mg/dL — ABNORMAL HIGH (ref 70–99)

## 2023-07-15 LAB — CBC
HCT: 34.9 % — ABNORMAL LOW (ref 36.0–46.0)
Hemoglobin: 10.8 g/dL — ABNORMAL LOW (ref 12.0–15.0)
MCH: 31.8 pg (ref 26.0–34.0)
MCHC: 30.9 g/dL (ref 30.0–36.0)
MCV: 102.6 fL — ABNORMAL HIGH (ref 80.0–100.0)
Platelets: 240 10*3/uL (ref 150–400)
RBC: 3.4 MIL/uL — ABNORMAL LOW (ref 3.87–5.11)
RDW: 14.6 % (ref 11.5–15.5)
WBC: 12.2 10*3/uL — ABNORMAL HIGH (ref 4.0–10.5)
nRBC: 1.9 % — ABNORMAL HIGH (ref 0.0–0.2)

## 2023-07-15 LAB — MAGNESIUM: Magnesium: 2 mg/dL (ref 1.7–2.4)

## 2023-07-15 LAB — PHOSPHORUS: Phosphorus: 5.2 mg/dL — ABNORMAL HIGH (ref 2.5–4.6)

## 2023-07-15 MED ORDER — NEPRO/CARBSTEADY PO LIQD
237.0000 mL | Freq: Two times a day (BID) | ORAL | Status: DC
  Administered 2023-07-15 – 2023-07-19 (×6): 237 mL via ORAL

## 2023-07-15 MED ORDER — RENA-VITE PO TABS
1.0000 | ORAL_TABLET | Freq: Every day | ORAL | Status: DC
  Administered 2023-07-15 – 2023-07-19 (×5): 1 via ORAL
  Filled 2023-07-15 (×5): qty 1

## 2023-07-15 MED ORDER — SENNOSIDES-DOCUSATE SODIUM 8.6-50 MG PO TABS
1.0000 | ORAL_TABLET | Freq: Every day | ORAL | Status: DC
  Administered 2023-07-15: 1 via ORAL
  Filled 2023-07-15: qty 1

## 2023-07-15 MED ORDER — POLYETHYLENE GLYCOL 3350 17 G PO PACK
17.0000 g | PACK | Freq: Every day | ORAL | Status: DC
  Administered 2023-07-15 – 2023-07-19 (×2): 17 g via ORAL
  Filled 2023-07-15 (×2): qty 1

## 2023-07-15 MED ORDER — INSULIN GLARGINE-YFGN 100 UNIT/ML ~~LOC~~ SOLN
30.0000 [IU] | Freq: Every day | SUBCUTANEOUS | Status: DC
  Administered 2023-07-15: 30 [IU] via SUBCUTANEOUS
  Filled 2023-07-15 (×2): qty 0.3

## 2023-07-15 MED ORDER — CHLORHEXIDINE GLUCONATE CLOTH 2 % EX PADS
6.0000 | MEDICATED_PAD | Freq: Every day | CUTANEOUS | Status: DC
  Administered 2023-07-17 – 2023-07-19 (×3): 6 via TOPICAL

## 2023-07-15 NOTE — Progress Notes (Signed)
Pt has been assessed by 2 IV Team RNs. No suitable veins seen on [name] for IV placement at this time. Pt with working PIV at this time, end of catheter visualized within vein on [name]. Spoke with floor RN, aware of plan.

## 2023-07-15 NOTE — Progress Notes (Addendum)
NAME:  CHRISELDA TRUSLER, MRN:  [id number], DOB:  04/14/1951, LOS: 4 ADMISSION DATE:  07/11/2023, CONSULTATION DATE:  07/11/2023 REFERRING MD:  Johny Blamer, IM, CHIEF COMPLAINT:  shortness of breath    History of Present Illness:  72 year old female with past medical history of TIA, HTN, HLD, GERD, DM, COPD on 3LNC, obesity, ESRD on HD TThS, recent catheterization who presents to the emergency department after dialysis for altered mental status. Per EDP, patient completed dialysis today. She was drowsy at completion of treatment and was sent to ED. On arrival, patient was obtunded, requiring significant tactile stimulation to awaken. Patient was placed on BiPAP which improved her mental status. ABG and CXR obtained showing fluid overload, and significant acidosis with pH 7.169, pCO2 97.6, pO2 54. After about 30 minutes of BiPAP treatment patient took herself off, refusing to wear it again. She was noted to be coughing up clear sputum. Hospitalist was consulted and subsequently PCCM consulted for severe acidosis. Labs in ED notable for WBC 10.8, BUN 33, Cr 5.10, pH as above, troponin 90, lactic negative. CXR with pulmonary congestion, CT head and CT chest are pending at time of evaluation.   On my exam, the patient is awake, alert, asking to go home or eat. She is not very sure why she needs admission or further BiPAP at this time. She denies feeling sick in the past few days. She does endorse being compliant on her dialysis. Notes she wears 3LNC which is her baseline and has not had to increase recently. She endorses compliance with medications. She denies new cough or fever, n/v/d.   After exam, PCCM was re-consulted as patient became obtunded again, hypotensive requiring peripheral levo and was admitted to [facility].   Pertinent  Medical History  TIA, HTN, HLD, GERD, DM, COPD on 3LNC, obesity, ESRD on HD TThS  Significant Hospital Events: Including procedures, antibiotic start and stop dates in addition to  other pertinent events   10/3: admitted to [facility]  10/4 started antibiotics,  Interim History / Subjective:  Overnight events: Patient was given melatonin  Patient evaluated at bedside this morning. Patient notes that she is doing ok. She does not have any concerns this morning.   Objective   Blood pressure 119/64, pulse 85, temperature 98 F (36.7 C), temperature source Oral, resp. rate 20, weight 102.9 kg, SpO2 96%.     Blood pressure currently measuring with maps in the 70s on 2 of Levophed at this time, satting at 94-96% on 4 L nasal cannula, pulse in the 80s, afebrile  Intake/Output Summary (Last 24 hours) at 07/15/2023 0834 Last data filed at 07/14/2023 1300 Gross per 24 hour  Intake 435.4 ml  Output --  Net 435.4 ml   Filed Weights   07/12/23 0314 07/13/23 0112 07/13/23 1450  Weight: 103.2 kg 102.9 kg 102.9 kg    Examination: General: Resting in bed, no acute distress HENT: Normocephalic, atraumatic Lungs: Labored breathing on 4 L nasal cannula, lungs clear to auscultation bilaterally Cardiovascular: Regular rate and rhythm, no murmurs, rubs, or gallops Abdomen: Soft, nondistended, normoactive bowel sounds Extremities: Able to freely move extremities, no edema appreciated Neuro: Awake and alert able to follow all instructions Skin: No rashes appreciated    Labs reviewed Glucose 208-272 Phosphorus 5.2 Magnesium 2.0 CBC: White count 12.2, hemoglobin 10.8, MCV 102.6 BMP sodium 132, potassium 5.1, creatinine 5.5  Blood cultures-no growth x 3 days  Resolved Hospital Problem list     Assessment & Plan:   This is  a 72 year old female with a past medical history of CAD, ESRD on dialysis Tuesday, Thursday, Saturday, type 2 diabetes mellitus, hypertension, hyperlipidemia who presented to the emergency department concerns of respiratory distress.  Patient also found to have hypotension requiring vasopressor therapy.  Patient required vasopressor therapy and admitted to  the ICU for further evaluation management.  #Acute on chronic hypoxemic and hypercarbic respiratory failure secondary to volume overload #OSA #COPD on 3 L nasal cannula at baseline Patient has been weaned down to 4 L so far.  Did have some labored breathing this morning.  Breath sounds do not sound as if patient was volume overloaded.  Plan for dialysis tomorrow.  During hospitalization patient has had net negative about 0.95 L.  Plan for dialysis tomorrow.  Will continue to wean off pressors.  Patient is able to protect her own airway. -Continue dialysis per nephrology -Continue to monitor oxygenation status -Wean down to 2 L nasal cannula -Currently on Augmentin -Continue Brovana nebulizers, Pulmicort nebulizers Yupelri nebulizers -Patient is currently on Symbicort and Incruse outpatient  #Shock Do not think this is cardiogenic shock or hypovolemic shock.  Likely a component of distributive shock.  No source of infection has been found.  Patient is responding to antibiotic therapy.  Will continue with Augmentin and linezolid. -Today is day 4 of antibiotics -Patient is weaning off Levophed, at 2 mics this morning -Continue midodrine 15 mg every 8 hours -Continue stress dose steroids, can start weaning off  #Chronic HFrEF  #Nonobstructive CAD #HLD #HTN No acute concern for chest pain today.  Troponin did peak at 158. -Continu Plavix 75 mg daily -Continue Crestor 20 mg daily -Hold home Coreg 6.25 mg twice daily as blood pressure would not tolerate -Not a candidate for SGLT2 inhibitor or ACE inhibitor or ARB Or spironolactone given ESRD  #ESRD on HD TThS; last HD on 10/3 #Hyperphosphatemia -Nephrology is following, continue dialysis per nephrology  #DM -Glucoses measuring elevated into the 200s.  Will increase Semglee to 30 units daily -Continue sliding scale insulin -Continue scheduled 7 units NovoLog with meals - Goal blood glucose 140 and 180  #Hypothyroidism; TSH  WNL -Continue PTA Synthroid  #Constipation Has been 4 days since patient had a bowel movement.  Will optimize bowel regimen today.  #Acute metabolic encephalopathy, resolving  -PT/OT -Improving, will treat underlying etiology  Best Practice (right click and "Reselect all SmartList Selections" daily)   Diet/type: Regular consistency (see orders) DVT prophylaxis: subQ heparin GI prophylaxis: n/a Lines: n/a Foley:  n/a Code Status:  full code  Last date of multidisciplinary goals of care discussion [10/5 son updated]  Labs   CBC: Recent Labs  Lab 07/11/23 1321 07/11/23 1333 07/12/23 0308 07/12/23 0748 07/12/23 1410 07/14/23 0244 07/15/23 0345  WBC 10.8*  --  19.3*  --   --  12.8* 12.2*  NEUTROABS 9.0*  --   --   --   --   --   --   HGB 11.8*   < > 12.2 13.3 12.9 10.3* 10.8*  HCT 42.1   < > 44.3 39.0 38.0 34.2* 34.9*  MCV 112.6*  --  110.8*  --   --  103.6* 102.6*  PLT 237  --  245  --   --  269 240   < > = values in this interval not displayed.    Basic Metabolic Panel: Recent Labs  Lab 07/11/23 1321 07/11/23 1333 07/11/23 1334 07/12/23 0308 07/12/23 0748 07/12/23 1410 07/12/23 1749 07/13/23 0753 07/13/23 1505 07/14/23  0244 07/15/23 0345  NA 138 139   < > 136 133* 133*  --   --  130* 132* 132*  K 4.8 4.7   < > 4.5 4.3 4.4  --   --  4.1 3.6 5.1  CL 95* 99  --  93*  --   --   --   --  90* 90* 92*  CO2 31  --   --  28  --   --   --   --  25 30 24   GLUCOSE 145* 144*  --  149*  --   --   --   --  244* 283* 175*  BUN 28* 33*  --  21  --   --   --   --  55* 40* 62*  CREATININE 4.92* 5.10*  --  3.86*  --   --   --   --  5.45* 4.12* 5.50*  CALCIUM 9.0  --   --  9.2  --   --   --   --  9.2 9.3 9.9  MG  --   --   --  2.2  --   --  2.1 2.1  --   --  2.0  PHOS  --   --   --   --   --   --  5.6* 5.4* 5.3* 3.0 5.2*   < > = values in this interval not displayed.   GFR: Estimated Creatinine Clearance: 10.6 mL/min (A) (by C-G formula based on SCr of 5.5 mg/dL  (H)). Recent Labs  Lab 07/11/23 1321 07/11/23 1334 07/11/23 1611 07/12/23 0308 07/14/23 0244 07/15/23 0345  PROCALCITON  --   --  0.52  --   --   --   WBC 10.8*  --   --  19.3* 12.8* 12.2*  LATICACIDVEN  --  0.9  --   --   --   --     Liver Function Tests: Recent Labs  Lab 07/11/23 1321 07/13/23 1505 07/14/23 0244  AST 23  --   --   ALT 13  --   --   ALKPHOS 81  --   --   BILITOT 0.4  --   --   PROT 7.4  --   --   ALBUMIN 3.6 2.9* 3.0*    ABG    Component Value Date/Time   PHART 7.190 (LL) 07/12/2023 1410   PCO2ART 83.3 (HH) 07/12/2023 1410   PO2ART 167 (H) 07/12/2023 1410   HCO3 31.5 (H) 07/12/2023 1410   TCO2 34 (H) 07/12/2023 1410   O2SAT 99 07/12/2023 1410        Critical care time:      This patient is critically ill with multiple organ system failure which requires frequent high complexity decision making, assessment, support, evaluation, and titration of therapies. This was completed through the application of advanced monitoring technologies and extensive interpretation of multiple databases. During this encounter critical care time was devoted to patient care services described in this note for 30 minutes.   Modena Slater, DO 07/15/23 8:34 AM Internal Medicine Resident PGY-2 [phone]  For contact information, see Amion. If no response to pager, please call PCCM consult pager. After hours, 7PM- 7AM, please call Elink.

## 2023-07-15 NOTE — Progress Notes (Signed)
[facility] Progress Note  Subjective: seen in ICU. No c/o's.   Vitals:   07/15/23 1400 07/15/23 1436 07/15/23 1500 07/15/23 1529  BP: 90/68  97/72   Pulse: 78  83 86  Resp: 19  (!) 21 18  Temp:    97.9 F (36.6 C)  TempSrc:    Oral  SpO2: 94%  96% 96%  Weight:      Height:  5\' 3"  (1.6 m)      Exam: Gen [place] O2, obese, on  O2 No jvd or bruits Chest clear bilat  RRR no MRG Abd soft ntnd no mass or ascites +bs Ext no LE edema    LUA AVG+bruit     Renal-related home meds: - coreg 6.25 bid - home O2  - pregabalin 25 every day - renvela 2 ac tid        OP HD: [facility] T[facility] 4h   400/600  99kg  2/2 bath   aVG   Heparin 2000+ 2000 midrun - last HD 10/3 today, post wt 106kg - 3wks ago coming off 3 over, this last week coming off 7kg up - hectorol 3 mcg  - venofer 100mg  three times per week, thru 10/05 - mircera 50 mcg IV q 2 wks, last 10/3, due 10/17     Assessment/ Plan: ESRD - on HD TTS. Had HD at OP unit on Thursday. Had HD here Thurs, Fri and Saturday. Next HD Tuesday.  Volume overload - looks euvolemic on exam. Last wt 3kg over. Needs updating.  Hypotension - home coreg on hold. Midodrine ^'d to 15 tid. Off levo gtt.  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 No esa needs at this time.  MBD ckd - CCa and phos are in range. Cont IV vdra and renvela as binder.  DNR      [name] MD  [facility] 07/15/2023, 4:36 PM  Recent Labs  Lab 07/13/23 1505 07/14/23 0244 07/15/23 0345  HGB  --  10.3* 10.8*  ALBUMIN 2.9* 3.0*  --   CALCIUM 9.2 9.3 9.9  PHOS 5.3* 3.0 5.2*  CREATININE 5.45* 4.12* 5.50*  K 4.1 3.6 5.1   No results for input(s): "IRON", "TIBC", "FERRITIN" in the last 168 hours. Inpatient medications:  amoxicillin-clavulanate  1 tablet Per Tube Q24H   arformoterol  15 mcg Nebulization Q12H   budesonide (PULMICORT) nebulizer solution  0.5 mg Nebulization BID   Chlorhexidine Gluconate Cloth  6 each Topical Q0600   clopidogrel  75 mg Oral Daily   feeding  supplement (NEPRO CARB STEADY)  237 mL Oral BID BM   heparin  2,000 Units Dialysis Once in dialysis   heparin  5,000 Units Subcutaneous Q8H   hydrocortisone sod succinate (SOLU-CORTEF) inj  100 mg Intravenous Q12H   insulin aspart  0-20 Units Subcutaneous TID WC   insulin aspart  10 Units Subcutaneous TID WC   insulin glargine-yfgn  30 Units Subcutaneous Daily   levothyroxine  175 mcg Oral Q0600   linezolid  600 mg Oral Q12H   melatonin  3 mg Oral QHS   midodrine  15 mg Oral Q8H   multivitamin  1 tablet Oral QHS   polyethylene glycol  17 g Oral Daily   revefenacin  175 mcg Nebulization Daily   rosuvastatin  20 mg Oral Daily   senna-docusate  1 tablet Oral QHS    anticoagulant sodium citrate     norepinephrine (LEVOPHED) Adult infusion Stopped (07/15/23 0806)   alteplase, anticoagulant sodium citrate, docusate sodium,  heparin, heparin, ipratropium-albuterol, lidocaine (PF), lidocaine-prilocaine, mouth rinse, pentafluoroprop-tetrafluoroeth, polyethylene glycol, white petrolatum

## 2023-07-15 NOTE — Progress Notes (Addendum)
Initial Nutrition Assessment  DOCUMENTATION CODES:  Obesity unspecified  INTERVENTION:  Continue current diet as ordered for elevated CBG and altered electrolytes. If intake trends down, consider liberalizing Nepro Shake po BID, each supplement provides 425 kcal and 19 grams protein Renavite daily for HD needs  NUTRITION DIAGNOSIS:   Increased nutrient needs related to chronic illness (ESRD on HD) as evidenced by estimated needs.  GOAL:   Patient will meet greater than or equal to 90% of their needs  MONITOR:   PO intake, Supplement acceptance, Labs  REASON FOR ASSESSMENT:  Consult Enteral/tube feeding initiation and management  ASSESSMENT:  Pt with hx of CHF, ESRD on HD (TTS), COPD, DM type 2, GERD, gout, PVD, HTN, HLD, and hx several TIA presented to ED from HD with AMS.  10/3 - presented to ED 10/4 - cortrak placed, TF initiated 10/6 - cortrak removed  Pt resting in bed at the time of assessment. Breakfast tray at bedside noted to be about 30% consumed. Pt has had varied intake since diet was advanced. Overall intake is fair. Pt reports that she just doesn't feel like eating more today. Pt reports she uses a wheel chair to mobilize at baseline.  Reports that at baseline, eats 2-3 smaller meals a day. States that she does drink ensure at home and likes the berry flavor. Will add nepro while admitted as labs are altered. Scheduled to have HD tomorrow. Will add renavite for increased micronutrient needs with HD   Did discuss BM, pt reports none since admission which is abnormal for her. Usually goes daily. Bowel regimen increased today.   Admit weight: 104.8 Current weight: 102.9 kg Estimated Dry Weight: 99 kg per outpatient HD note   Intake/Output Summary (Last 24 hours) at 07/15/2023 1440 Last data filed at 07/15/2023 0900 Gross per 24 hour  Intake 118.02 ml  Output --  Net 118.02 ml  Net IO Since Admission: -834.69 mL [07/15/23 1440]  Average Meal  Intake: 10/5-10/6: 69% intake x 4 recorded meals  Nutritionally Relevant Medications: Scheduled Meds:  amoxicillin-clavulanate  1 tablet Per Tube Q24H   hydrocortisone sod succinate (SOLU-CORTEF) inj  100 mg Intravenous Q12H   insulin aspart  0-20 Units Subcutaneous TID WC   insulin aspart  10 Units Subcutaneous TID WC   insulin glargine-yfgn  30 Units Subcutaneous Daily   linezolid  600 mg Oral Q12H   polyethylene glycol  17 g Oral Daily   rosuvastatin  20 mg Oral Daily   senna-docusate  1 tablet Oral QHS   PRN Meds: docusate sodium, polyethylene glycol  Labs Reviewed: Na 132, chloride 92 BUN 62, creatinine 5.5 Phosphorus 5.2 (on a binder) CBG ranges from 185-272 mg/dL over the last 24 hours HgbA1c 7.1% (7/6)  NUTRITION - FOCUSED PHYSICAL EXAM: Flowsheet Row Most Recent Value  Orbital Region No depletion  Upper Arm Region Mild depletion  Thoracic and Lumbar Region No depletion  Buccal Region No depletion  Temple Region No depletion  Clavicle Bone Region No depletion  Clavicle and Acromion Bone Region Mild depletion  Scapular Bone Region No depletion  Dorsal Hand No depletion  Patellar Region No depletion  Anterior Thigh Region No depletion  Posterior Calf Region No depletion  Edema (RD Assessment) Moderate  Hair Reviewed  Eyes Reviewed  Mouth Reviewed  Skin Reviewed  Nails Reviewed    Diet Order:   Diet Order             Diet renal/carb modified with fluid restriction Diet-HS Snack?  Nothing; Fluid restriction: 1200 mL Fluid; Room service appropriate? Yes; Fluid consistency: Thin  Diet effective now                   EDUCATION NEEDS:  Education needs have been addressed  Skin:  Skin Assessment: Reviewed RN Assessment  Last BM:  PTA  Height:  Ht Readings from Last 1 Encounters:  07/15/23 5\' 3"  (1.6 m)    Weight:  Wt Readings from Last 1 Encounters:  07/13/23 102.9 kg    Ideal Body Weight:  52.3 kg  BMI:  Body mass index is 40.19  kg/m.  Estimated Nutritional Needs:  Kcal:  1600-1800 kcal/d Protein:  75-90g/d Fluid:  1L+UOP    Greig Castilla, RD, LDN Clinical Dietitian RD pager # available in AMION  After hours/weekend pager # available in [facility]

## 2023-07-16 ENCOUNTER — Inpatient Hospital Stay (HOSPITAL_COMMUNITY): Payer: 59

## 2023-07-16 DIAGNOSIS — J9602 Acute respiratory failure with hypercapnia: Secondary | ICD-10-CM | POA: Diagnosis not present

## 2023-07-16 DIAGNOSIS — Z992 Dependence on renal dialysis: Secondary | ICD-10-CM | POA: Diagnosis not present

## 2023-07-16 DIAGNOSIS — J9601 Acute respiratory failure with hypoxia: Secondary | ICD-10-CM

## 2023-07-16 DIAGNOSIS — N186 End stage renal disease: Secondary | ICD-10-CM | POA: Diagnosis not present

## 2023-07-16 LAB — CBC
HCT: 35.8 % — ABNORMAL LOW (ref 36.0–46.0)
Hemoglobin: 10.6 g/dL — ABNORMAL LOW (ref 12.0–15.0)
MCH: 30.6 pg (ref 26.0–34.0)
MCHC: 29.6 g/dL — ABNORMAL LOW (ref 30.0–36.0)
MCV: 103.5 fL — ABNORMAL HIGH (ref 80.0–100.0)
Platelets: 298 10*3/uL (ref 150–400)
RBC: 3.46 MIL/uL — ABNORMAL LOW (ref 3.87–5.11)
RDW: 14.8 % (ref 11.5–15.5)
WBC: 11.2 10*3/uL — ABNORMAL HIGH (ref 4.0–10.5)
nRBC: 1.2 % — ABNORMAL HIGH (ref 0.0–0.2)

## 2023-07-16 LAB — GLUCOSE, CAPILLARY
Glucose-Capillary: 176 mg/dL — ABNORMAL HIGH (ref 70–99)
Glucose-Capillary: 206 mg/dL — ABNORMAL HIGH (ref 70–99)
Glucose-Capillary: 117 mg/dL — ABNORMAL HIGH (ref 70–99)

## 2023-07-16 LAB — RENAL FUNCTION PANEL
Albumin: 3.5 g/dL (ref 3.5–5.0)
Anion gap: 16 — ABNORMAL HIGH (ref 5–15)
BUN: 76 mg/dL — ABNORMAL HIGH (ref 8–23)
CO2: 26 mmol/L (ref 22–32)
Calcium: 9.8 mg/dL (ref 8.9–10.3)
Chloride: 91 mmol/L — ABNORMAL LOW (ref 98–111)
Creatinine, Ser: 7.04 mg/dL — ABNORMAL HIGH (ref 0.44–1.00)
GFR, Estimated: 6 mL/min — ABNORMAL LOW (ref >60)
Glucose, Bld: 152 mg/dL — ABNORMAL HIGH (ref 70–99)
Phosphorus: 7.2 mg/dL — ABNORMAL HIGH (ref 2.5–4.6)
Potassium: 5.2 mmol/L — ABNORMAL HIGH (ref 3.5–5.1)
Sodium: 133 mmol/L — ABNORMAL LOW (ref 135–145)

## 2023-07-16 LAB — MAGNESIUM: Magnesium: 2.1 mg/dL (ref 1.7–2.4)

## 2023-07-16 MED ORDER — ALTEPLASE 2 MG IJ SOLR: 2.0000 mg | Freq: Once | INTRAMUSCULAR | Status: DC | PRN

## 2023-07-16 MED ORDER — SODIUM CHLORIDE 0.9 % IV SOLN
8.0000 mg | Freq: Four times a day (QID) | INTRAVENOUS | Status: DC | PRN
  Filled 2023-07-16: qty 4

## 2023-07-16 MED ORDER — HEPARIN SODIUM (PORCINE) 1000 UNIT/ML DIALYSIS
2000.0000 [IU] | Freq: Once | INTRAMUSCULAR | Status: AC
  Administered 2023-07-18: 2000 [IU] via INTRAVENOUS_CENTRAL
  Filled 2023-07-16: qty 2

## 2023-07-16 MED ORDER — ONDANSETRON HCL 4 MG/2ML IJ SOLN
INTRAMUSCULAR | Status: AC
  Administered 2023-07-16: 4 mg via INTRAVENOUS
  Filled 2023-07-16: qty 2

## 2023-07-16 MED ORDER — PENTAFLUOROPROP-TETRAFLUOROETH EX AERO: 1.0000 | INHALATION_SPRAY | CUTANEOUS | Status: DC | PRN

## 2023-07-16 MED ORDER — INSULIN ASPART 100 UNIT/ML IJ SOLN
7.0000 [IU] | Freq: Three times a day (TID) | INTRAMUSCULAR | Status: DC
  Administered 2023-07-17 – 2023-07-19 (×7): 7 [IU] via SUBCUTANEOUS

## 2023-07-16 MED ORDER — ALBUMIN HUMAN 25 % IV SOLN
12.5000 g | Freq: Once | INTRAVENOUS | Status: AC
  Administered 2023-07-16: 12.5 g via INTRAVENOUS

## 2023-07-16 MED ORDER — HEPARIN SODIUM (PORCINE) 1000 UNIT/ML DIALYSIS: 1000.0000 [IU] | INTRAMUSCULAR | Status: DC | PRN

## 2023-07-16 MED ORDER — BISACODYL 10 MG RE SUPP
10.0000 mg | Freq: Once | RECTAL | Status: DC
  Filled 2023-07-16: qty 1

## 2023-07-16 MED ORDER — ONDANSETRON HCL 4 MG/2ML IJ SOLN
INTRAMUSCULAR | Status: AC
  Filled 2023-07-16: qty 2

## 2023-07-16 MED ORDER — ONDANSETRON HCL 4 MG/2ML IJ SOLN: 4.0000 mg | Freq: Four times a day (QID) | INTRAMUSCULAR | Status: DC | PRN

## 2023-07-16 MED ORDER — LIDOCAINE-PRILOCAINE 2.5-2.5 % EX CREA: 1.0000 | TOPICAL_CREAM | CUTANEOUS | Status: DC | PRN

## 2023-07-16 MED ORDER — HEPARIN SODIUM (PORCINE) 1000 UNIT/ML DIALYSIS
2000.0000 [IU] | INTRAMUSCULAR | Status: AC | PRN
  Administered 2023-07-18: 2000 [IU] via INTRAVENOUS_CENTRAL
  Filled 2023-07-16: qty 2

## 2023-07-16 MED ORDER — METOPROLOL TARTRATE 5 MG/5ML IV SOLN
5.0000 mg | Freq: Four times a day (QID) | INTRAVENOUS | Status: DC | PRN
  Filled 2023-07-16: qty 5

## 2023-07-16 MED ORDER — SENNOSIDES-DOCUSATE SODIUM 8.6-50 MG PO TABS
1.0000 | ORAL_TABLET | Freq: Two times a day (BID) | ORAL | Status: DC
  Administered 2023-07-17: 1 via ORAL
  Filled 2023-07-16: qty 1

## 2023-07-16 MED ORDER — ANTICOAGULANT SODIUM CITRATE 4% (200MG/5ML) IV SOLN: 5.0000 mL | Status: DC | PRN

## 2023-07-16 MED ORDER — INSULIN GLARGINE-YFGN 100 UNIT/ML ~~LOC~~ SOLN
35.0000 [IU] | Freq: Every day | SUBCUTANEOUS | Status: DC
  Administered 2023-07-16 – 2023-07-19 (×4): 35 [IU] via SUBCUTANEOUS
  Filled 2023-07-16 (×5): qty 0.35

## 2023-07-16 MED ORDER — ALBUMIN HUMAN 25 % IV SOLN
INTRAVENOUS | Status: AC
  Administered 2023-07-16: 12.5 g
  Filled 2023-07-16: qty 100

## 2023-07-16 MED ORDER — LIDOCAINE HCL (PF) 1 % IJ SOLN: 5.0000 mL | INTRAMUSCULAR | Status: DC | PRN

## 2023-07-16 MED ORDER — NEPRO/CARBSTEADY PO LIQD: 237.0000 mL | ORAL | Status: DC | PRN

## 2023-07-16 NOTE — Plan of Care (Signed)
Problem: Education: Goal: Understanding of CV disease, CV risk reduction, and recovery process will improve Outcome: Progressing Goal: Individualized Educational Video(s) Outcome: Progressing   Problem: Activity: Goal: Ability to return to baseline activity level will improve Outcome: Progressing   Problem: Cardiovascular: Goal: Ability to achieve and maintain adequate cardiovascular perfusion will improve Outcome: Progressing Goal: Vascular access site(s) Level 0-1 will be maintained Outcome: Progressing   Problem: Health Behavior/Discharge Planning: Goal: Ability to safely manage health-related needs after discharge will improve Outcome: Progressing   Problem: Education: Goal: Knowledge of General Education information will improve Description: Including pain rating scale, medication(s)/side effects and non-pharmacologic comfort measures Outcome: Progressing   Problem: Health Behavior/Discharge Planning: Goal: Ability to manage health-related needs will improve Outcome: Progressing   Problem: Clinical Measurements: Goal: Ability to maintain clinical measurements within normal limits will improve Outcome: Progressing Goal: Will remain free from infection Outcome: Progressing Goal: Diagnostic test results will improve Outcome: Progressing Goal: Respiratory complications will improve Outcome: Progressing Goal: Cardiovascular complication will be avoided Outcome: Progressing   Problem: Activity: Goal: Risk for activity intolerance will decrease Outcome: Progressing   Problem: Nutrition: Goal: Adequate nutrition will be maintained Outcome: Progressing   Problem: Coping: Goal: Level of anxiety will decrease Outcome: Progressing   Problem: Elimination: Goal: Will not experience complications related to bowel motility Outcome: Progressing Goal: Will not experience complications related to urinary retention Outcome: Progressing   Problem: Pain Managment: Goal:  General experience of comfort will improve Outcome: Progressing   Problem: Safety: Goal: Ability to remain free from injury will improve Outcome: Progressing   Problem: Skin Integrity: Goal: Risk for impaired skin integrity will decrease Outcome: Progressing   Problem: Education: Goal: Ability to describe self-care measures that may prevent or decrease complications (Diabetes Survival Skills Education) will improve Outcome: Progressing Goal: Individualized Educational Video(s) Outcome: Progressing   Problem: Coping: Goal: Ability to adjust to condition or change in health will improve Outcome: Progressing   Problem: Fluid Volume: Goal: Ability to maintain a balanced intake and output will improve Outcome: Progressing   Problem: Health Behavior/Discharge Planning: Goal: Ability to identify and utilize available resources and services will improve Outcome: Progressing Goal: Ability to manage health-related needs will improve Outcome: Progressing   Problem: Metabolic: Goal: Ability to maintain appropriate glucose levels will improve Outcome: Progressing   Problem: Nutritional: Goal: Maintenance of adequate nutrition will improve Outcome: Progressing Goal: Progress toward achieving an optimal weight will improve Outcome: Progressing   Problem: Skin Integrity: Goal: Risk for impaired skin integrity will decrease Outcome: Progressing   Problem: Tissue Perfusion: Goal: Adequacy of tissue perfusion will improve Outcome: Progressing   Problem: Education: Goal: Understanding of CV disease, CV risk reduction, and recovery process will improve Outcome: Progressing   Problem: Education: Goal: Individualized Educational Video(s) Outcome: Progressing   Problem: Activity: Goal: Ability to return to baseline activity level will improve Outcome: Progressing   Problem: Cardiovascular: Goal: Vascular access site(s) Level 0-1 will be maintained Outcome: Progressing    Problem: Cardiovascular: Goal: Ability to achieve and maintain adequate cardiovascular perfusion will improve Outcome: Progressing   Problem: Cardiovascular: Goal: Ability to achieve and maintain adequate cardiovascular perfusion will improve Outcome: Progressing   Problem: Cardiovascular: Goal: Vascular access site(s) Level 0-1 will be maintained Outcome: Progressing   Problem: Health Behavior/Discharge Planning: Goal: Ability to safely manage health-related needs after discharge will improve Outcome: Progressing   Problem: Health Behavior/Discharge Planning: Goal: Ability to safely manage health-related needs after discharge will improve Outcome: Progressing   Problem: Education:  Goal: Knowledge of General Education information will improve Description: Including pain rating scale, medication(s)/side effects and non-pharmacologic comfort measures Outcome: Progressing   Problem: Education: Goal: Knowledge of General Education information will improve Description: Including pain rating scale, medication(s)/side effects and non-pharmacologic comfort measures Outcome: Progressing   Problem: Clinical Measurements: Goal: Diagnostic test results will improve Outcome: Progressing   Problem: Clinical Measurements: Goal: Will remain free from infection Outcome: Progressing   Problem: Clinical Measurements: Goal: Will remain free from infection Outcome: Progressing   Problem: Clinical Measurements: Goal: Respiratory complications will improve Outcome: Progressing   Problem: Clinical Measurements: Goal: Respiratory complications will improve Outcome: Progressing   Problem: Clinical Measurements: Goal: Respiratory complications will improve Outcome: Progressing   Problem: Elimination: Goal: Will not experience complications related to urinary retention Outcome: Progressing   Problem: Elimination: Goal: Will not experience complications related to bowel  motility Outcome: Progressing   Problem: Elimination: Goal: Will not experience complications related to bowel motility Outcome: Progressing   Problem: Coping: Goal: Level of anxiety will decrease Outcome: Progressing   Problem: Coping: Goal: Level of anxiety will decrease Outcome: Progressing   Problem: Nutrition: Goal: Adequate nutrition will be maintained Outcome: Progressing   Problem: Nutrition: Goal: Adequate nutrition will be maintained Outcome: Progressing   Problem: Education: Goal: Individualized Educational Video(s) Outcome: Progressing   Problem: Education: Goal: Ability to describe self-care measures that may prevent or decrease complications (Diabetes Survival Skills Education) will improve Outcome: Progressing   Problem: Education: Goal: Individualized Educational Video(s) Outcome: Progressing   Problem: Metabolic: Goal: Ability to maintain appropriate glucose levels will improve Outcome: Progressing   Problem: Health Behavior/Discharge Planning: Goal: Ability to manage health-related needs will improve Outcome: Progressing   Problem: Health Behavior/Discharge Planning: Goal: Ability to manage health-related needs will improve Outcome: Progressing   Problem: Health Behavior/Discharge Planning: Goal: Ability to identify and utilize available resources and services will improve Outcome: Progressing   Problem: Health Behavior/Discharge Planning: Goal: Ability to identify and utilize available resources and services will improve Outcome: Progressing   Problem: Tissue Perfusion: Goal: Adequacy of tissue perfusion will improve Outcome: Progressing   Problem: Tissue Perfusion: Goal: Adequacy of tissue perfusion will improve Outcome: Progressing   Problem: Skin Integrity: Goal: Risk for impaired skin integrity will decrease Outcome: Progressing   Problem: Skin Integrity: Goal: Risk for impaired skin integrity will decrease Outcome:  Progressing   Problem: Nutritional: Goal: Progress toward achieving an optimal weight will improve Outcome: Progressing   Problem: Nutritional: Goal: Progress toward achieving an optimal weight will improve Outcome: Progressing

## 2023-07-16 NOTE — Progress Notes (Signed)
I am the resident in ICU.  Patient is currently in ICU waiting for transfer to progressive.  Patient went into SVT with rates into the 180s.  Tried initially to have vagal maneuvers with patient bearing down which were initially successful.  Rate came back down to the 90s.  The patient then went back up into the 180s, and I tried carotid massage.  This brought patient down into the 90s.  Patient is currently in the 90s at this time.  Primary team notified.

## 2023-07-16 NOTE — Progress Notes (Signed)
Pt bedside monitor alarmed. Pt noted to have a HR of 196. Pt awake in bed, denied any chest pain or feeling of heart racing. Unable to capture rhythm on 12 lead.  Bedside monitor EKG slip printed and placed in chart.  Pt again asked if she is currently having chest pain. Pt reported "5/10 chest pressure". When asked if she has had this pain before pt stated "when I am aggravated." Asked pt if she is aggravated now, and pt stated "yes."  [facility] RN on camera during above events and [facility] MD made aware.

## 2023-07-16 NOTE — Progress Notes (Signed)
Tolerated dialysis well. No further SVT. She has some waxing and waning mental status consistent with hypoactive delirium. She needs to wear bipap at bedtime and prn with naps during the day. I have enforced this with her. Ok for transfer to PCU and for [facility] to assume care 10/09.   Durel Salts, MD Pulmonary and Critical Care Medicine [facility] 07/16/2023 6:03 PM Pager: see AMION  If no response to pager, please call critical care on call (see AMION) until 7pm After 7:00 pm call Elink

## 2023-07-16 NOTE — Progress Notes (Signed)
[facility] Progress Note  Subjective: seen in ICU. Difficulty breathing this am w/ [name] [name] and wheezing. Got nebs w/o much effect per RN. On HD now. Do not intubate. Pt refusing bipap.   Vitals:   07/16/23 1145 07/16/23 1200 07/16/23 1215 07/16/23 1230  BP: (!) 153/90 (!) 151/92 (!) 142/82 (!) 144/86  Pulse: (!) 112 (!) 113 (!) 113 (!) 102  Resp: (!) 33 (!) 26 19 18   Temp:      TempSrc:      SpO2: 97% 97% 97% 98%  Weight:      Height:        Exam: Gen in resp distress, ^wob, on dialysis No jvd or bruits Chest exp wheezing, no notable crackles at bases RRR no MRG Abd soft ntnd no mass or ascites +bs Ext no LE edema    LUA AVG+bruit     Renal-related home meds: - coreg 6.25 bid - home O2  - pregabalin 25 every day - renvela 2 ac tid        OP HD: [facility] TTS 4h   400/600  99kg  2/2 bath   aVG   Heparin 2000+ 2000 midrun - last HD 10/3 today, post wt 106kg - 3wks ago coming off 3 over, this last week coming off 7kg up - hectorol 3 mcg  - venofer 100mg  three times per week, thru 10/05 - mircera 50 mcg IV q 2 wks, last 10/3, due 10/17    CXR today 10/08- vasc congestion, early IS edema   Assessment/ Plan: ESRD - on HD TTS. Had HD at OP unit on Thursday. Had HD here Thurs, Fri and Saturday. HD today Resp distress - may be volume related, is on HD now. Max UF w/ HD.   Volume overload - looks euvolemic on exam. 5-6kg over. UF w/ HD as above.  Hypotension - home coreg on hold. Cont midodrine 15 tid. Off pressors.  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 No esa needs at this time.  MBD ckd - CCa and phos are in range. Cont IV vdra and renvela as binder.  Limited DNR - do not intubate      [name] Moselle MD  [facility] 07/16/2023, 12:40 PM  Recent Labs  Lab 07/14/23 0244 07/15/23 0345 07/16/23 0242  HGB 10.3* 10.8* 10.6*  ALBUMIN 3.0*  --  3.5  CALCIUM 9.3 9.9 9.8  PHOS 3.0 5.2* 7.2*  CREATININE 4.12* 5.50* 7.04*  K 3.6 5.1 5.2*   No results for input(s): "IRON",  "TIBC", "FERRITIN" in the last 168 hours. Inpatient medications:  amoxicillin-clavulanate  1 tablet Per Tube Q24H   arformoterol  15 mcg Nebulization Q12H   bisacodyl  10 mg Rectal Once   budesonide (PULMICORT) nebulizer solution  0.5 mg Nebulization BID   Chlorhexidine Gluconate Cloth  6 each Topical Q0600   Chlorhexidine Gluconate Cloth  6 each Topical Q0600   clopidogrel  75 mg Oral Daily   feeding supplement (NEPRO CARB STEADY)  237 mL Oral BID BM   [START ON 07/17/2023] heparin  2,000 Units Dialysis Once in dialysis   heparin  5,000 Units Subcutaneous Q8H   insulin aspart  0-20 Units Subcutaneous TID WC   insulin aspart  7 Units Subcutaneous TID WC   insulin glargine-yfgn  35 Units Subcutaneous Daily   levothyroxine  175 mcg Oral Q0600   linezolid  600 mg Oral Q12H   melatonin  3 mg Oral QHS   midodrine  15 mg Oral Q8H  multivitamin  1 tablet Oral QHS   polyethylene glycol  17 g Oral Daily   revefenacin  175 mcg Nebulization Daily   rosuvastatin  20 mg Oral Daily   senna-docusate  1 tablet Oral BID    anticoagulant sodium citrate     alteplase, anticoagulant sodium citrate, docusate sodium, feeding supplement (NEPRO CARB STEADY), heparin, heparin, [START ON 07/17/2023] heparin, ipratropium-albuterol, lidocaine (PF), lidocaine-prilocaine, metoprolol tartrate, ondansetron (ZOFRAN) IV, mouth rinse, pentafluoroprop-tetrafluoroeth, polyethylene glycol, white petrolatum

## 2023-07-16 NOTE — Progress Notes (Signed)
   07/16/23 1510  Vitals  Temp 98.9 F (37.2 C)  Pulse Rate 89  Resp 16  BP 104/67  SpO2 95 %  O2 Device Bi-PAP  Weight 103.2 kg  Type of Weight Post-Dialysis  Oxygen Therapy  FiO2 (%) 50 %  Patient Activity (if Appropriate) In bed  Pulse Oximetry Type Continuous  Oximetry Probe Site Changed No  Post Treatment  Dialyzer Clearance Lightly streaked  Hemodialysis Intake (mL) 0 mL  Liters Processed 67.4  Fluid Removed (mL) 2100 mL  Tolerated HD Treatment Yes  Post-Hemodialysis Comments see progress notes  AVG/AVF Arterial Site Held (minutes) 10 minutes  AVG/AVF Venous Site Held (minutes) 10 minutes   HD tx to be done at ICU bedside Drowsy---able to respond to name and location  Informed consent signed and in chart.   TX duration:3.15  Patient tolerated well Drowsy---now on BIPAP at 50%  Hand-off given to patient's nurse.   Access used: LUAG Access issues: frequent repositioning of venous needle required--able to maintain a 350bfr  Total UF removed: 2100 Medication(s) given: albumin 25grams iv given   [facility]

## 2023-07-16 NOTE — Progress Notes (Addendum)
eLink Physician-Brief Progress Note Patient Name: [name] [name] DOB: 1950/11/29 MRN: [id number]   Date of Service  07/16/2023  HPI/Events of Note  Nausea/vomiting  eICU Interventions  Add Zofran   0620 - refuses Bipap, worsening tachycardia.  Due for HD today. No immediate intervention tonight  Intervention Category Minor Interventions: Routine modifications to care plan (e.g. PRN medications for pain, fever)  Ramonia [name] 07/16/2023, 4:42 AM

## 2023-07-16 NOTE — Progress Notes (Signed)
Pt heard "grunting" from nurses station. RN went in to check on pt. Pt not very interactive with RN. When pt asked if something is wrong pt nodded "yes" but not forthcoming on what is wrong.   Multiple questions asked but pt not participating in conversation. Pt is A/O x4.  Pt noted to be spitting up white frothy phlegm and burping. Pt hummed when asked if she was feeling nauseated. Reached out to [facility] for PRN Zofran.  At one point pt nodded "yes" to chest pain, so 12 lead EKG done. However, when asked again, pt said "no" to having chest pain. CBG also checked (176).  PRN breathing treatment given. Pt repositioned in bed and HOB elevated.  Upon further questioning pt not participating in conversation other than firmly saying "NO" if she wanted to use the Bipap.   VS stable at this time. Call bell in reach, bed alarm on.

## 2023-07-16 NOTE — Progress Notes (Signed)
NAME:  [name] [name], MRN:  [id number], DOB:  31-Aug-1951, LOS: 5 ADMISSION DATE:  07/11/2023, CONSULTATION DATE:  07/11/2023 REFERRING MD:  Johny Blamer, IM, CHIEF COMPLAINT:  shortness of breath    History of Present Illness:  72 year old female with past medical history of TIA, HTN, HLD, GERD, DM, COPD on 3LNC, obesity, ESRD on HD TThS, recent catheterization who presents to the emergency department after dialysis for altered mental status. Per EDP, patient completed dialysis today. She was drowsy at completion of treatment and was sent to ED. On arrival, patient was obtunded, requiring significant tactile stimulation to awaken. Patient was placed on BiPAP which improved her mental status. ABG and CXR obtained showing fluid overload, and significant acidosis with pH 7.169, pCO2 97.6, pO2 54. After about 30 minutes of BiPAP treatment patient took herself off, refusing to wear it again. She was noted to be coughing up clear sputum. Hospitalist was consulted and subsequently PCCM consulted for severe acidosis. Labs in ED notable for WBC 10.8, BUN 33, Cr 5.10, pH as above, troponin 90, lactic negative. CXR with pulmonary congestion, CT head and CT chest are pending at time of evaluation.   On my exam, the patient is awake, alert, asking to go home or eat. She is not very sure why she needs admission or further BiPAP at this time. She denies feeling sick in the past few days. She does endorse being compliant on her dialysis. Notes she wears 3LNC which is her baseline and has not had to increase recently. She endorses compliance with medications. She denies new cough or fever, n/v/d.   After exam, PCCM was re-consulted as patient became obtunded again, hypotensive requiring peripheral levo and was admitted to [facility].   Pertinent  Medical History  TIA, HTN, HLD, GERD, DM, COPD on 3LNC, obesity, ESRD on HD TThS  Significant Hospital Events: Including procedures, antibiotic start and stop dates in addition to  other pertinent events   10/3: admitted to PCCM  10/5: started antibiotics  Interim History / Subjective:  Overnight events: Patient went into SVT with rates into the 180s.  Required vagal maneuvers as well as carotid massage.  Patient evaluated at bedside this morning.  Patient is short of breath.  She states she is having some chest pains.  Objective   Blood pressure (!) 144/86, pulse (!) 102, temperature 98.7 F (37.1 C), resp. rate 18, height 5\' 3"  (1.6 m), weight 105.3 kg, SpO2 98%.       Intake/Output Summary (Last 24 hours) at 07/16/2023 1244 Last data filed at 07/16/2023 0500 Gross per 24 hour  Intake 630 ml  Output --  Net 630 ml   Filed Weights   07/15/23 1800 07/16/23 0145 07/16/23 1115  Weight: 104.8 kg 105.3 kg 105.3 kg    Examination: General: Resting in bed, uncomfortable  HENT: Normocephalic, atraumatic Lungs: Labored breathing on 4 L nasal cannula this morning with wheezing and rales  Cardiovascular: Tachycardic rate and rhythm, no murmurs, rubs, or gallops Abdomen: Soft, nondistended, normoactive bowel sounds Extremities: 1+ pitting edema appreciated to bilateral lower extremities Neuro: Awake and alert, oriented x 3, able to follow all instructions Skin: No rashes appreciated  Resolved Hospital Problem list   Shock  Assessment & Plan:   This is a 72 year old female with a past medical history of CAD, ESRD on dialysis Tuesday, Thursday, Saturday, type 2 diabetes mellitus, hypertension, hyperlipidemia who presented to the emergency department concerns of respiratory distress.  Patient also found to have hypotension requiring  vasopressor therapy.  Patient required vasopressor therapy and admitted to the ICU for further evaluation management.  #Acute on chronic hypoxemic and hypercarbic respiratory failure secondary to volume overload #OSA #COPD on 3 L nasal cannula at baseline Overnight, was weaned down to 3 L nasal cannula, currently on 4.  Did have  labored breathing this morning.  With wheezing.  Is likely secondary to volume overload.  Will likely need dialysis earlier today than 1 PM.  Had to reach out to nephrology to see if patient can go earlier.   -Continue dialysis per nephrology -Continue to monitor oxygenation status -Wean down to 3 L nasal cannula -Currently on Augmentin and linezolid day 4 of 7 -Continue Brovana nebulizers, Pulmicort nebulizers Yupelri nebulizers -Patient is currently on Symbicort and Incruse outpatient  #SVT Went into SVT overnight.  Rates into the 180s.  EKG confirmed.  This morning seen on telemetry.  Did vagal maneuvers including carotid massage which brought patient back down to the low 90s-100s.  I think dialysis will likely help patient today. -Monitor on telemetry-dialysis -Start metoprolol to tartrate 5 mg every 4 hours as needed for rates greater than 110  #Chronic HFrEF  #Nonobstructive CAD #HLD #HTN Patient did have some chest pain today.  EKG did not show any new changes.  Patient does have elevated blood pressures today, but anticipate this could be secondary to volume overload.  Will hold home Coreg at this time.  If blood pressure remains elevated could potentially decrease midodrine before starting Coreg. -Continu Plavix 75 mg daily -Continue Crestor 20 mg daily -Hold home Coreg 6.25 mg twice daily as blood pressure would not tolerate -Not a candidate for SGLT2 inhibitor or ACE inhibitor or ARB Or spironolactone given ESRD  #ESRD on HD TThS; last HD on 10/3 #Hyperphosphatemia -Dialysis today per nephrology  #DM -Glucoses measuring elevated into the 200s.  Will increase Semglee to 35 units daily -Continue sliding scale insulin -Continue scheduled 7 units NovoLog with meals, hold meal coverage if not eating full meals - Goal blood glucose 140 and 180  #Hypothyroidism; TSH WNL -Continue PTA Synthroid  #Constipation Has been 5 days since patient had a bowel movement.  Will optimize  bowel regimen today. -Dulcolax suppository today -Increase senna to twice daily  #Acute metabolic encephalopathy, resolving  -PT/OT -Improving, will treat underlying etiology  Best Practice (right click and "Reselect all SmartList Selections" daily)   Diet/type: Regular consistency (see orders) DVT prophylaxis: subQ heparin GI prophylaxis: n/a Lines: n/a Foley:  n/a Code Status:  full code   Labs   CBC: Recent Labs  Lab 07/11/23 1321 07/11/23 1333 07/12/23 0308 07/12/23 0748 07/12/23 1410 07/14/23 0244 07/15/23 0345 07/16/23 0242  WBC 10.8*  --  19.3*  --   --  12.8* 12.2* 11.2*  NEUTROABS 9.0*  --   --   --   --   --   --   --   HGB 11.8*   < > 12.2 13.3 12.9 10.3* 10.8* 10.6*  HCT 42.1   < > 44.3 39.0 38.0 34.2* 34.9* 35.8*  MCV 112.6*  --  110.8*  --   --  103.6* 102.6* 103.5*  PLT 237  --  245  --   --  269 240 298   < > = values in this interval not displayed.    Basic Metabolic Panel: Recent Labs  Lab 07/12/23 0308 07/12/23 1610 07/12/23 1410 07/12/23 1749 07/12/23 1749 07/13/23 0753 07/13/23 1505 07/14/23 0244 07/15/23 0345 07/16/23 9604  NA 136   < > 133*  --   --   --  130* 132* 132* 133*  K 4.5   < > 4.4  --   --   --  4.1 3.6 5.1 5.2*  CL 93*  --   --   --   --   --  90* 90* 92* 91*  CO2 28  --   --   --   --   --  25 30 24 26   GLUCOSE 149*  --   --   --   --   --  244* 283* 175* 152*  BUN 21  --   --   --   --   --  55* 40* 62* 76*  CREATININE 3.86*  --   --   --   --   --  5.45* 4.12* 5.50* 7.04*  CALCIUM 9.2  --   --   --   --   --  9.2 9.3 9.9 9.8  MG 2.2  --   --  2.1  --  2.1  --   --  2.0 2.1  PHOS  --   --   --  5.6*   < > 5.4* 5.3* 3.0 5.2* 7.2*   < > = values in this interval not displayed.   GFR: Estimated Creatinine Clearance: 8.4 mL/min (A) (by C-G formula based on SCr of 7.04 mg/dL (H)). Recent Labs  Lab 07/11/23 1334 07/11/23 1611 07/12/23 0308 07/14/23 0244 07/15/23 0345 07/16/23 0242  PROCALCITON  --  0.52  --   --    --   --   WBC  --   --  19.3* 12.8* 12.2* 11.2*  LATICACIDVEN 0.9  --   --   --   --   --     Liver Function Tests: Recent Labs  Lab 07/11/23 1321 07/13/23 1505 07/14/23 0244 07/16/23 0242  AST 23  --   --   --   ALT 13  --   --   --   ALKPHOS 81  --   --   --   BILITOT 0.4  --   --   --   PROT 7.4  --   --   --   ALBUMIN 3.6 2.9* 3.0* 3.5    ABG    Component Value Date/Time   PHART 7.190 (LL) 07/12/2023 1410   PCO2ART 83.3 (HH) 07/12/2023 1410   PO2ART 167 (H) 07/12/2023 1410   HCO3 31.5 (H) 07/12/2023 1410   TCO2 34 (H) 07/12/2023 1410   O2SAT 99 07/12/2023 1410        Critical care time:      This patient is critically ill with multiple organ system failure which requires frequent high complexity decision making, assessment, support, evaluation, and titration of therapies. This was completed through the application of advanced monitoring technologies and extensive interpretation of multiple databases. During this encounter critical care time was devoted to patient care services described in this note for 30 minutes.   Modena Slater, DO 07/16/23 12:44 PM Internal Medicine Resident PGY-2 [phone]  For contact information, see Amion. If no response to pager, please call PCCM consult pager. After hours, 7PM- 7AM, please call Elink.

## 2023-07-16 NOTE — Progress Notes (Addendum)
Pt complaining of mild chest pain (4/10). Pt appearing to have labored breathing with expiratory wheezing. RR 24. BP 148/91. O2 100% on 2LNC. HR 112 with frequent episodes of SVT into the 190s. Valsalva maneuver and carotid massage by Dr. Allena Katz returned the pt's HR to 90s. Pt A&O x 4. Dr Arlean Hopping & Dr. Renford Dills notified. HD RN's notified to see patient ASAP for HD today.

## 2023-07-17 DIAGNOSIS — J9621 Acute and chronic respiratory failure with hypoxia: Secondary | ICD-10-CM

## 2023-07-17 DIAGNOSIS — A419 Sepsis, unspecified organism: Secondary | ICD-10-CM | POA: Diagnosis not present

## 2023-07-17 DIAGNOSIS — G9341 Metabolic encephalopathy: Secondary | ICD-10-CM | POA: Diagnosis not present

## 2023-07-17 DIAGNOSIS — J9602 Acute respiratory failure with hypercapnia: Secondary | ICD-10-CM | POA: Diagnosis not present

## 2023-07-17 DIAGNOSIS — R6521 Severe sepsis with septic shock: Secondary | ICD-10-CM

## 2023-07-17 DIAGNOSIS — J9622 Acute and chronic respiratory failure with hypercapnia: Secondary | ICD-10-CM

## 2023-07-17 LAB — GLUCOSE, CAPILLARY
Glucose-Capillary: 186 mg/dL — ABNORMAL HIGH (ref 70–99)
Glucose-Capillary: 153 mg/dL — ABNORMAL HIGH (ref 70–99)
Glucose-Capillary: 206 mg/dL — ABNORMAL HIGH (ref 70–99)
Glucose-Capillary: 152 mg/dL — ABNORMAL HIGH (ref 70–99)

## 2023-07-17 LAB — CBC
HCT: 35.8 % — ABNORMAL LOW (ref 36.0–46.0)
Hemoglobin: 10.4 g/dL — ABNORMAL LOW (ref 12.0–15.0)
MCH: 31.3 pg (ref 26.0–34.0)
MCHC: 29.1 g/dL — ABNORMAL LOW (ref 30.0–36.0)
MCV: 107.8 fL — ABNORMAL HIGH (ref 80.0–100.0)
Platelets: 248 10*3/uL (ref 150–400)
RBC: 3.32 MIL/uL — ABNORMAL LOW (ref 3.87–5.11)
RDW: 14.9 % (ref 11.5–15.5)
WBC: 10.5 10*3/uL (ref 4.0–10.5)
nRBC: 1.3 % — ABNORMAL HIGH (ref 0.0–0.2)

## 2023-07-17 LAB — RENAL FUNCTION PANEL
Albumin: 3.8 g/dL (ref 3.5–5.0)
Anion gap: 16 — ABNORMAL HIGH (ref 5–15)
BUN: 55 mg/dL — ABNORMAL HIGH (ref 8–23)
CO2: 27 mmol/L (ref 22–32)
Calcium: 9.9 mg/dL (ref 8.9–10.3)
Chloride: 92 mmol/L — ABNORMAL LOW (ref 98–111)
Creatinine, Ser: 5.47 mg/dL — ABNORMAL HIGH (ref 0.44–1.00)
GFR, Estimated: 8 mL/min — ABNORMAL LOW (ref >60)
Glucose, Bld: 83 mg/dL (ref 70–99)
Phosphorus: 7 mg/dL — ABNORMAL HIGH (ref 2.5–4.6)
Potassium: 4.7 mmol/L (ref 3.5–5.1)
Sodium: 135 mmol/L (ref 135–145)

## 2023-07-17 LAB — CULTURE, BLOOD (ROUTINE X 2)
Culture: NO GROWTH
Culture: NO GROWTH

## 2023-07-17 LAB — MAGNESIUM: Magnesium: 2 mg/dL (ref 1.7–2.4)

## 2023-07-17 MED ORDER — SENNOSIDES-DOCUSATE SODIUM 8.6-50 MG PO TABS
2.0000 | ORAL_TABLET | Freq: Every day | ORAL | Status: DC
  Administered 2023-07-17 – 2023-07-19 (×3): 2 via ORAL
  Filled 2023-07-17 (×3): qty 2

## 2023-07-17 MED ORDER — ACETAMINOPHEN 325 MG PO TABS
650.0000 mg | ORAL_TABLET | Freq: Four times a day (QID) | ORAL | Status: DC | PRN
  Administered 2023-07-17 – 2023-07-19 (×2): 650 mg via ORAL
  Filled 2023-07-17 (×2): qty 2

## 2023-07-17 MED ORDER — METOPROLOL TARTRATE 25 MG PO TABS
25.0000 mg | ORAL_TABLET | Freq: Two times a day (BID) | ORAL | Status: DC
  Administered 2023-07-17: 25 mg via ORAL
  Filled 2023-07-17 (×2): qty 1

## 2023-07-17 NOTE — Progress Notes (Signed)
   07/17/23 1557  TOC Assessment  Once discharged, how will the patient get to their discharge location? Ambulance (If she cannot get into a vehicle  Right now she in unable)  Expected Discharge Plan Home w Home Health Services (TBD)  Final next level of care Home w Home Health Services  Barriers to Discharge Continued Medical Work up  Patient states their goals for this hospitalization and ongoing recovery are: Go Home  CMS Medicare.gov Compare Post Acute Care list provided to: Other (Comment Required) (Already current with [facility])  Choice offered to / list presented to  NA  Post Acute Care Choice [facility] Health [facility])  Living arrangements for the past 2 months Apartment  Lives with: Adult Children (Son)  Current home services DME;Home PT;Home OT (Has rolling walker ( cannot wal kat the moment ) hover round, electric scooter, hoyer lift and shower chair and Home O2 ([employer]))  In-house Referral NA  DME Arranged N/A  DME Agency [employer] ([employer])  HH Arranged  (Will reesume services with [facility])  HH Agency [facility]  Date [facility] Agency Contacted 07/17/23  Time HH Agency Contacted [phone]  Representative spoke with at [facility] Agency Lynnette  Do you feel safe going back to the place where you live? Y  Permission sought to share information with  Case Manager;Other (comment) (Home Health Agency)  Permission granted to share information with  Yes, Verbal Permission Granted  Permission granted to share info w [facility]  Criminal Activity/Legal Involvement Pertinent to Current Situation/Hospitalization No - Comment as needed  Need for Family Participation in Patient Care Y  Care giver support system in place? Y (Son  Also has HH SN/PT/OT And [facility]ide with [facility] .  There is a Arboriculturist Aid that comes every day for 3 hours and assists in getting patientent ready to attend  OPHD.  She attends [employer] in [place] on [address])  Appearance: Appears stated age   Attitude/Demeanor/Rapport Engaged;Gracious  Affect (typically observed) Pleasant  Orientation:  Oriented to Self;Oriented to Place;Oriented to  Time;Oriented to Situation  Alcohol / Substance Use Not Applicable  Psych Involvement N

## 2023-07-17 NOTE — Progress Notes (Signed)
PROGRESS NOTE        PATIENT DETAILS Name: [name] [name] Age: 72 y.o. Sex: female Date of Birth: [birth year] Admit Date: 07/11/2023 Admitting Physician Josephine Igo, DO ZOX:WRUE, [name], FNP  Brief Summary: Patient is a 72 y.o.  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  She was stabilized and transferred to [facility] on 10/9.    Significant events: 10/3>> admit to PCCM 10/9>> transferred to [facility]  Significant studies: 10/3>> CT head: No acute intracranial abnormality 10/3>> CT chest: Progressive enlargement of the soft tissue in prevascular space-suspicious for metastatic disease.  Unchanged left greater than right pleural effusion.  New groundglass opacities left upper lobe.  Significant microbiology data: 10/4>> blood culture: No growth  Procedures: None  Consults: PCCM Nephrology  Subjective: Awake/alert-claims she has not ambulated in almost a month.  Lives with son and a caregiver.  Objective: Vitals: Blood pressure 117/63, pulse 96, temperature 99.3 F (37.4 C), temperature source Axillary, resp. rate (!) 23, height 5\' 3"  (1.6 m), weight 103.2 kg, SpO2 93%.   Exam: Gen Exam:Alert awake-not in any distress HEENT:atraumatic, normocephalic Chest: B/L clear to auscultation anteriorly CVS:S1S2 regular Abdomen:soft non tender, non distended Extremities:no edema Neurology: Non focal Skin: no rash  Pertinent Labs/Radiology:    Latest Ref Rng & Units  07/17/2023    3:31 AM 07/16/2023    2:42 AM 07/15/2023    3:45 AM  CBC  WBC 4.0 - 10.5 K/uL 10.5  11.2  12.2   Hemoglobin 12.0 - 15.0 g/dL 45.4  09.8  11.9   Hematocrit 36.0 - 46.0 % 35.8  35.8  34.9   Platelets 150 - 400 K/uL 248  298  240     Lab Results  Component Value Date   NA 135 07/17/2023   K 4.7 07/17/2023   CL 92 (L) 07/17/2023   CO2 27 07/17/2023    Assessment/Plan: Acute on chronic hypoxemic and hypercarbic respiratory failure due to a combination of volume overload and COPD exacerbation (on 3 L of oxygen at baseline) Improved with bronchodilators/HD sessions Currently on 4-5 L-slowly titrate back down to usual regimen Pulmonary toileting.  Acute metabolic encephalopathy Secondary to hypercarbia/hypoxemia Improved with BiPAP Refuses to use BiPAP here in 5 W.-claims intolerant.  Understands life-threatening/life disabling consequences.  Culture negative septic shock Sepsis physiology has resolved All cultures negative Per PCCM note on 10/8-plan is to continue 7 days of empiric Augmentin/Zyvox.  SVT Maintaining sinus rhythm this morning Beta-blocker started  Acute on chronic HFrEF (EF 35-40% by TTE on 04/15/2023) Euvolemic on exam this morning Volume removal with hemodialysis. On midodrine-will need to be monitored closely and see if this can be tapered down over the next several days.  Nonobstructive CAD on recent LHC on 9/27 No chest pain Plavix/beta-blocker/statin  History of PAD Antiplatelet/statin  ESRD on HD TTS Nephrology following  Normocytic anemia Secondary to combination of ESRD/critical illness Aranesp/iron deferred to nephrology service Follow CBC  Hypothyroidism Synthroid  TSH 10/3 stable  DM-2 (A1c 7.1 on 7/6) CBGs stable-Semglee 35 units daily, 7 units of NovoLog with meals and SSI   Recent Labs    07/16/23 0455 07/16/23 0757 07/16/23 1556  GLUCAP 176* 206* 117*     History of lung cancer-s/p radiation Will need outpatient  follow-up with oncology and PET scan.  Some concern for metastatic disease on CT imaging done on admission.  Osteoarthritis of bilateral knees Stable at present-outpatient orthopedic evaluation  Constipation Had BM overnight Continue MiraLAX/senna  Debility/deconditioning Per patient-she has not ambulated in close to a month-previously minimally ambulatory and mostly bed to wheelchair bound PT/OT eval  Nutrition Status: Nutrition Problem: Increased nutrient needs Etiology: chronic illness (ESRD on HD) Signs/Symptoms: estimated needs Interventions: Refer to RD note for recommendations   Morbid Obesity: Estimated body mass index is 40.3 kg/m as calculated from the following:   Height as of this encounter: 5\' 3"  (1.6 m).   Weight as of this encounter: 103.2 kg.   Code status:   Code Status: Limited: Do not attempt resuscitation (DNR) -DNR-LIMITED -Do Not Intubate/DNI    DVT Prophylaxis: heparin injection 5,000 Units Start: 07/11/23 1700    Family Communication: None at bedside   Disposition Plan: Status is: Inpatient Remains inpatient appropriate because: Severity of illness   Planned Discharge Destination:Home health versus SNF   Diet: Diet Order             Diet renal/carb modified with fluid restriction Diet-HS Snack? Nothing; Fluid restriction: 1200 mL Fluid; Room service appropriate? Yes; Fluid consistency: Thin  Diet effective now                     Antimicrobial agents: Anti-infectives (From admission, onward)    Start     Dose/Rate Route Frequency Ordered Stop   07/14/23 1000  linezolid (ZYVOX) tablet 600 mg        600 mg Oral Every 12 hours 07/14/23 0838 07/18/23 2359   07/13/23 2200  linezolid (ZYVOX) tablet 600 mg  Status:  Discontinued        600 mg Per Tube Every 12 hours 07/13/23 1910 07/14/23 0838   07/13/23 2000  amoxicillin-clavulanate (AUGMENTIN) 500-125 MG per tablet 1 tablet        1 tablet Per Tube Every 24 hours 07/13/23 1910  07/18/23 2359   07/13/23 1700  vancomycin (VANCOCIN) IVPB 1000 mg/200 mL premix  Status:  Discontinued        1,000 mg 200 mL/hr over 60 Minutes Intravenous Every Sat (Hemodialysis) 07/13/23 1118 07/13/23 1910   07/12/23 1252  vancomycin variable dose per unstable renal function (pharmacist dosing)  Status:  Discontinued         Does not apply See admin instructions 07/12/23 1253 07/13/23 1910   07/12/23 1015  piperacillin-tazobactam (ZOSYN) IVPB 2.25 g  Status:  Discontinued        2.25 g 100 mL/hr over 30 Minutes Intravenous Every 8 hours 07/12/23 0922 07/13/23 1910   07/12/23 1015  vancomycin (VANCOREADY) IVPB 2000 mg/400 mL        2,000 mg 200 mL/hr over 120 Minutes Intravenous  Once 07/12/23 0922 07/12/23 1207        MEDICATIONS: Scheduled Meds:  amoxicillin-clavulanate  1 tablet Per Tube Q24H   arformoterol  15 mcg Nebulization Q12H   bisacodyl  10 mg Rectal Once   budesonide (PULMICORT) nebulizer solution  0.5 mg Nebulization BID   Chlorhexidine Gluconate Cloth  6 each Topical Q0600  Chlorhexidine Gluconate Cloth  6 each Topical Q0600   clopidogrel  75 mg Oral Daily   feeding supplement (NEPRO CARB STEADY)  237 mL Oral BID BM   heparin  2,000 Units Dialysis Once in dialysis   heparin  5,000 Units Subcutaneous Q8H   insulin aspart  0-20 Units Subcutaneous TID WC   insulin aspart  7 Units Subcutaneous TID WC   insulin glargine-yfgn  35 Units Subcutaneous Daily   levothyroxine  175 mcg Oral Q0600   linezolid  600 mg Oral Q12H   melatonin  3 mg Oral QHS   metoprolol tartrate  25 mg Oral BID   midodrine  15 mg Oral Q8H   multivitamin  1 tablet Oral QHS   polyethylene glycol  17 g Oral Daily   revefenacin  175 mcg Nebulization Daily   rosuvastatin  20 mg Oral Daily   senna-docusate  1 tablet Oral BID   Continuous Infusions:  anticoagulant sodium citrate     PRN Meds:.alteplase, anticoagulant sodium citrate, docusate sodium, feeding supplement (NEPRO CARB STEADY),  heparin, heparin, ipratropium-albuterol, lidocaine (PF), lidocaine-prilocaine, metoprolol tartrate, ondansetron (ZOFRAN) IV, mouth rinse, pentafluoroprop-tetrafluoroeth, polyethylene glycol, white petrolatum   I have personally reviewed following labs and imaging studies  LABORATORY DATA: CBC: Recent Labs  Lab 07/11/23 1321 07/11/23 1333 07/12/23 0308 07/12/23 0748 07/12/23 1410 07/14/23 0244 07/15/23 0345 07/16/23 0242 07/17/23 0331  WBC 10.8*  --  19.3*  --   --  12.8* 12.2* 11.2* 10.5  NEUTROABS 9.0*  --   --   --   --   --   --   --   --   HGB 11.8*   < > 12.2   < > 12.9 10.3* 10.8* 10.6* 10.4*  HCT 42.1   < > 44.3   < > 38.0 34.2* 34.9* 35.8* 35.8*  MCV 112.6*  --  110.8*  --   --  103.6* 102.6* 103.5* 107.8*  PLT 237  --  245  --   --  269 240 298 248   < > = values in this interval not displayed.    Basic Metabolic Panel: Recent Labs  Lab 07/12/23 1749 07/13/23 0753 07/13/23 1505 07/14/23 0244 07/15/23 0345 07/16/23 0242 07/17/23 0331  NA  --   --  130* 132* 132* 133* 135  K  --   --  4.1 3.6 5.1 5.2* 4.7  CL  --   --  90* 90* 92* 91* 92*  CO2  --   --  25 30 24 26 27   GLUCOSE  --   --  244* 283* 175* 152* 83  BUN  --   --  55* 40* 62* 76* 55*  CREATININE  --   --  5.45* 4.12* 5.50* 7.04* 5.47*  CALCIUM  --   --  9.2 9.3 9.9 9.8 9.9  MG 2.1 2.1  --   --  2.0 2.1 2.0  PHOS 5.6* 5.4* 5.3* 3.0 5.2* 7.2* 7.0*    GFR: Estimated Creatinine Clearance: 10.7 mL/min (A) (by C-G formula based on SCr of 5.47 mg/dL (H)).  Liver Function Tests: Recent Labs  Lab 07/11/23 1321 07/13/23 1505 07/14/23 0244 07/16/23 0242 07/17/23 0331  AST 23  --   --   --   --   ALT 13  --   --   --   --   ALKPHOS 81  --   --   --   --   BILITOT 0.4  --   --   --   --  PROT 7.4  --   --   --   --   ALBUMIN 3.6 2.9* 3.0* 3.5 3.8   No results for input(s): "LIPASE", "AMYLASE" in the last 168 hours. No results for input(s): "AMMONIA" in the last 168 hours.  Coagulation  Profile: No results for input(s): "INR", "PROTIME" in the last 168 hours.  Cardiac Enzymes: No results for input(s): "CKTOTAL", "CKMB", "CKMBINDEX", "TROPONINI" in the last 168 hours.  BNP (last 3 results) No results for input(s): "PROBNP" in the last 8760 hours.  Lipid Profile: No results for input(s): "CHOL", "HDL", "LDLCALC", "TRIG", "CHOLHDL", "LDLDIRECT" in the last 72 hours.  Thyroid Function Tests: No results for input(s): "TSH", "T4TOTAL", "FREET4", "T3FREE", "THYROIDAB" in the last 72 hours.  Anemia Panel: No results for input(s): "VITAMINB12", "FOLATE", "FERRITIN", "TIBC", "IRON", "RETICCTPCT" in the last 72 hours.  Urine analysis:    Component Value Date/Time   COLORURINE YELLOW 11/27/2021 0956   APPEARANCEUR HAZY (A) 11/27/2021 0956   LABSPEC 1.012 11/27/2021 0956   PHURINE 5.0 11/27/2021 0956   GLUCOSEU NEGATIVE 11/27/2021 0956   HGBUR NEGATIVE 11/27/2021 0956   BILIRUBINUR NEGATIVE 11/27/2021 0956   KETONESUR NEGATIVE 11/27/2021 0956   PROTEINUR 30 (A) 11/27/2021 0956   UROBILINOGEN 0.2 08/04/2014 1409   NITRITE NEGATIVE 11/27/2021 0956   LEUKOCYTESUR NEGATIVE 11/27/2021 0956    Sepsis Labs: Lactic Acid, Venous    Component Value Date/Time   LATICACIDVEN 0.9 07/11/2023 1334    MICROBIOLOGY: Recent Results (from the past 240 hour(s))  MRSA Next Gen by PCR, Nasal     Status: None   Collection Time: 07/11/23  5:32 PM   Specimen: Nasal Mucosa; Nasal Swab  Result Value Ref Range Status   MRSA by PCR Next Gen NOT DETECTED NOT DETECTED Final    Comment: (NOTE) The GeneXpert MRSA Assay (FDA approved for NASAL specimens only), is one component of a comprehensive MRSA colonization surveillance program. It is not intended to diagnose MRSA infection nor to guide or monitor treatment for MRSA infections. Test performance is not FDA approved in patients less than 18 years old. Performed at [facility], [address], [place], [place] [address]    Culture, blood (Routine X 2) w Reflex to ID Panel     Status: None (Preliminary result)   Collection Time: 07/12/23  8:38 AM   Specimen: BLOOD  Result Value Ref Range Status   Specimen Description BLOOD BLOOD RIGHT HAND  Final   Special Requests   Final    AEROBIC BOTTLE ONLY Blood Culture results may not be optimal due to an inadequate volume of blood received in culture bottles   Culture   Final    NO GROWTH 4 DAYS Performed at [facility], [address], [place], [place] [address]    Report Status PENDING  Incomplete  Culture, blood (Routine X 2) w Reflex to ID Panel     Status: None (Preliminary result)   Collection Time: 07/12/23  8:41 AM   Specimen: BLOOD  Result Value Ref Range Status   Specimen Description BLOOD BLOOD RIGHT HAND  Final   Special Requests   Final    AEROBIC BOTTLE ONLY Blood Culture results may not be optimal due to an inadequate volume of blood received in culture bottles   Culture   Final    NO GROWTH 4 DAYS Performed at [facility], [address], [place], [place] [address]    Report Status PENDING  Incomplete  RADIOLOGY STUDIES/RESULTS: DG CHEST PORT 1 VIEW  Result Date: 07/16/2023 CLINICAL DATA:  Shortness of breath EXAM: PORTABLE CHEST 1 VIEW COMPARISON:  07/12/2023 FINDINGS: Gross cardiomegaly. Diffuse bilateral interstitial pulmonary opacity and retrocardiac atelectasis or consolidation. No acute osseous findings. IMPRESSION: Gross cardiomegaly with diffuse bilateral interstitial pulmonary opacity and retrocardiac atelectasis or consolidation. Findings are consistent with edema or infection. No focal airspace opacity. Electronically Signed   By: Jearld Lesch M.D.   On: 07/16/2023 11:27     LOS: 6 days   Jeoffrey Massed, MD  [facility]    To contact the attending provider between 7A-7P or the covering provider during after hours 7P-7A, please log into the web site [email or web address] and access using universal [place]  password for that web site. If you do not have the password, please call the hospital operator.  07/17/2023, 8:19 AM

## 2023-07-17 NOTE — Progress Notes (Signed)
[facility] Progress Note  Subjective: seen in room. Breathing much better today. Looks quite tired though today.   Vitals:   07/16/23 2341 07/17/23 0409 07/17/23 0800 07/17/23 1200  BP: (!) 101/56 116/68 117/63 118/70  Pulse: 82 92 96 84  Resp: 17 20 (!) 23 (!) 22  Temp: 98.1 F (36.7 C) 98.6 F (37 C) 99.3 F (37.4 C) 98.9 F (37.2 C)  TempSrc: Oral Oral Axillary Oral  SpO2: 96% 91% 93% 92%  Weight:      Height:        Exam: Gen calm, [place] O2,  No jvd or bruits Chest wheezing resolved, normal BS RRR no RG Abd soft ntnd no mass or ascites +bs Ext no LE edema    LUA AVG+bruit     Renal-related home meds: - coreg 6.25 bid - home O2  - pregabalin 25 every day - renvela 2 ac tid        OP HD: [facility] TTS 4h   400/600  99kg  2/2 bath   aVG   Heparin 2000+ 2000 midrun - last HD 10/3 today, post wt 106kg - 3wks ago coming off 3 over, this last week coming off 7kg up - hectorol 3 mcg  - venofer 100mg  three times per week, thru 10/05 - mircera 50 mcg IV q 2 wks, last 10/3, due 10/17    CXR today 10/08- vasc congestion, early IS edema   Assessment/ Plan: Septic shock - cx's were negative. Getting empiric abx for 7 days per pmd.  ESRD - on HD TTS. Had HD Thurs/ Sat last week here, and yesterday (Tuesday). Looks better today. Next HD tomorrow. Overall prognosis is guarded.  Resp distress - Tuesday am, seemed to respond to volume removal w/ dialysis yesterday.  Volume overload - still up 4kg. Mild edema LE's. Can't get more than 2.5 L off due to hypotension on HD.  Hypotension - home coreg on hold. Cont midodrine 15 tid.  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 No esa needs at this time.  MBD ckd - CCa and phos are in range. Cont IV vdra and renvela as binder.  Chronic resp failure/ COPD - is on 3 L O2 at home HFrEF - last EF 35-40% in July  Limited DNR - do not intubate. Was refusing bipap yest as well.  DM2 - per pmd      [name] MD  [facility] 07/17/2023, 1:38  PM  Recent Labs  Lab 07/16/23 0242 07/17/23 0331  HGB 10.6* 10.4*  ALBUMIN 3.5 3.8  CALCIUM 9.8 9.9  PHOS 7.2* 7.0*  CREATININE 7.04* 5.47*  K 5.2* 4.7   No results for input(s): "IRON", "TIBC", "FERRITIN" in the last 168 hours. Inpatient medications:  amoxicillin-clavulanate  1 tablet Per Tube Q24H   arformoterol  15 mcg Nebulization Q12H   bisacodyl  10 mg Rectal Once   budesonide (PULMICORT) nebulizer solution  0.5 mg Nebulization BID   Chlorhexidine Gluconate Cloth  6 each Topical Q0600   Chlorhexidine Gluconate Cloth  6 each Topical Q0600   clopidogrel  75 mg Oral Daily   feeding supplement (NEPRO CARB STEADY)  237 mL Oral BID BM   heparin  2,000 Units Dialysis Once in dialysis   heparin  5,000 Units Subcutaneous Q8H   insulin aspart  0-20 Units Subcutaneous TID WC   insulin aspart  7 Units Subcutaneous TID WC   insulin glargine-yfgn  35 Units Subcutaneous Daily   levothyroxine  175 mcg Oral Q0600  linezolid  600 mg Oral Q12H   melatonin  3 mg Oral QHS   metoprolol tartrate  25 mg Oral BID   midodrine  15 mg Oral Q8H   multivitamin  1 tablet Oral QHS   polyethylene glycol  17 g Oral Daily   revefenacin  175 mcg Nebulization Daily   rosuvastatin  20 mg Oral Daily   senna-docusate  2 tablet Oral QHS    anticoagulant sodium citrate     acetaminophen, alteplase, anticoagulant sodium citrate, feeding supplement (NEPRO CARB STEADY), heparin, heparin, ipratropium-albuterol, lidocaine (PF), lidocaine-prilocaine, metoprolol tartrate, ondansetron (ZOFRAN) IV, mouth rinse, pentafluoroprop-tetrafluoroeth, polyethylene glycol, white petrolatum

## 2023-07-17 NOTE — Progress Notes (Signed)
Physical Therapy Treatment Patient Details Name: [name] [name] MRN: [id number] DOB: April 25, 1951 Today's Date: 07/17/2023   History of Present Illness 72 yo female admitted 10/3 with AMS after HD, pt hypotensive requiring pressor support, shock. PMhx: CHF, obesity, Lung CA, TIA, HTN, HLD, GERD, DM, COPD on 3LNC, obesity, ESRD on HD TThS, gout, PVD    PT Comments  Pt in bed upon arrival and agreeable to PT session. Pt requested to sit in the recliner during today's session. Pt able to roll with MaxAx2 to prepare for using the hoyer lift. Worked on bed mobility and generalized strength in today's session. Pt was able to perform exercises in a limited range while seated in the recliner. Pt is progressing slowly towards goals. Acute PT to follow.      If plan is discharge home, recommend the following: A lot of help with bathing/dressing/bathroom;A lot of help with walking and/or transfers;Assistance with cooking/housework;Assist for transportation     Equipment Recommendations  None recommended by PT       Precautions / Restrictions Precautions Precautions: Fall Restrictions Weight Bearing Restrictions: No     Mobility  Bed Mobility   Bed Mobility: Rolling Rolling: +2 for physical assistance, Max assist, Used rails      General bed mobility comments: pt able to assist with rolling with UE on rail, MaxAx2 to complete rolls to get chair lift pad under pt       Ambulation/Gait  General Gait Details: unable at baseline        Balance Overall balance assessment: Needs assistance   Sitting balance-Leahy Scale: Fair          Cognition Arousal: Alert Behavior During Therapy: WFL for tasks assessed/performed Overall Cognitive Status: Within Functional Limits for tasks assessed           Exercises General Exercises - Upper Extremity Shoulder Flexion: AROM, Seated, Both, 5 reps Elbow Flexion: AROM, Seated, Both, 5 reps General Exercises - Lower Extremity Ankle  Circles/Pumps: AROM, Both, 10 reps (limited ROM) Long Arc Quad: AROM, Seated, Both, 10 reps Hip Flexion/Marching: AROM, Seated, Both, 10 reps (limited ROM)        Pertinent Vitals/Pain Pain Assessment Pain Assessment: No/denies pain           PT Goals (current goals can now be found in the care plan section) Progress towards PT goals: Progressing toward goals    Frequency    Min 1X/week       AM-PAC PT "6 Clicks" Mobility   Outcome Measure  Help needed turning from your back to your side while in a flat bed without using bedrails?: A Lot Help needed moving from lying on your back to sitting on the side of a flat bed without using bedrails?: A Lot Help needed moving to and from a bed to a chair (including a wheelchair)?: Total Help needed standing up from a chair using your arms (e.g., wheelchair or bedside chair)?: Total Help needed to walk in hospital room?: Total Help needed climbing 3-5 steps with a railing? : Total 6 Click Score: 8    End of Session Equipment Utilized During Treatment: Oxygen Activity Tolerance: Patient tolerated treatment well Patient left: in chair;with call bell/phone within reach;with chair alarm set Nurse Communication: Mobility status PT Visit Diagnosis: Other abnormalities of gait and mobility (R26.89);Muscle weakness (generalized) (M62.81)     Time: 1130-1200 PT Time Calculation (min) (ACUTE ONLY): 30 min  Charges:    $Therapeutic Exercise: 8-22 mins $Therapeutic Activity: 8-22 mins PT  General Charges $$ ACUTE PT VISIT: 1 Visit                     Hilton Cork, PT, DPT Secure Chat Preferred  Rehab Office [phone]   Arturo Morton Brion Aliment 07/17/2023, 1:10 PM

## 2023-07-17 NOTE — Plan of Care (Signed)
Problem: Education: Goal: Understanding of CV disease, CV risk reduction, and recovery process will improve Outcome: Progressing Goal: Individualized Educational Video(s) Outcome: Progressing   Problem: Activity: Goal: Ability to return to baseline activity level will improve Outcome: Progressing   Problem: Cardiovascular: Goal: Ability to achieve and maintain adequate cardiovascular perfusion will improve Outcome: Progressing Goal: Vascular access site(s) Level 0-1 will be maintained Outcome: Progressing   Problem: Health Behavior/Discharge Planning: Goal: Ability to safely manage health-related needs after discharge will improve Outcome: Progressing   Problem: Education: Goal: Knowledge of General Education information will improve Description: Including pain rating scale, medication(s)/side effects and non-pharmacologic comfort measures Outcome: Progressing   Problem: Health Behavior/Discharge Planning: Goal: Ability to manage health-related needs will improve Outcome: Progressing   Problem: Clinical Measurements: Goal: Ability to maintain clinical measurements within normal limits will improve Outcome: Progressing Goal: Will remain free from infection Outcome: Progressing Goal: Diagnostic test results will improve Outcome: Progressing Goal: Respiratory complications will improve Outcome: Progressing Goal: Cardiovascular complication will be avoided Outcome: Progressing   Problem: Activity: Goal: Risk for activity intolerance will decrease Outcome: Progressing   Problem: Nutrition: Goal: Adequate nutrition will be maintained Outcome: Progressing   Problem: Coping: Goal: Level of anxiety will decrease Outcome: Progressing   Problem: Elimination: Goal: Will not experience complications related to bowel motility Outcome: Progressing Goal: Will not experience complications related to urinary retention Outcome: Progressing   Problem: Pain Managment: Goal:  General experience of comfort will improve Outcome: Progressing   Problem: Safety: Goal: Ability to remain free from injury will improve Outcome: Progressing   Problem: Skin Integrity: Goal: Risk for impaired skin integrity will decrease Outcome: Progressing   Problem: Education: Goal: Ability to describe self-care measures that may prevent or decrease complications (Diabetes Survival Skills Education) will improve Outcome: Progressing Goal: Individualized Educational Video(s) Outcome: Progressing   Problem: Coping: Goal: Ability to adjust to condition or change in health will improve Outcome: Progressing   Problem: Fluid Volume: Goal: Ability to maintain a balanced intake and output will improve Outcome: Progressing   Problem: Health Behavior/Discharge Planning: Goal: Ability to identify and utilize available resources and services will improve Outcome: Progressing Goal: Ability to manage health-related needs will improve Outcome: Progressing   Problem: Metabolic: Goal: Ability to maintain appropriate glucose levels will improve Outcome: Progressing   Problem: Nutritional: Goal: Maintenance of adequate nutrition will improve Outcome: Progressing Goal: Progress toward achieving an optimal weight will improve Outcome: Progressing   Problem: Skin Integrity: Goal: Risk for impaired skin integrity will decrease Outcome: Progressing   Problem: Tissue Perfusion: Goal: Adequacy of tissue perfusion will improve Outcome: Progressing   Problem: Education: Goal: Understanding of CV disease, CV risk reduction, and recovery process will improve Outcome: Progressing   Problem: Education: Goal: Individualized Educational Video(s) Outcome: Progressing   Problem: Activity: Goal: Ability to return to baseline activity level will improve Outcome: Progressing   Problem: Cardiovascular: Goal: Ability to achieve and maintain adequate cardiovascular perfusion will  improve Outcome: Progressing   Problem: Cardiovascular: Goal: Vascular access site(s) Level 0-1 will be maintained Outcome: Progressing   Problem: Health Behavior/Discharge Planning: Goal: Ability to safely manage health-related needs after discharge will improve Outcome: Progressing   Problem: Education: Goal: Knowledge of General Education information will improve Description: Including pain rating scale, medication(s)/side effects and non-pharmacologic comfort measures Outcome: Progressing   Problem: Health Behavior/Discharge Planning: Goal: Ability to manage health-related needs will improve Outcome: Progressing   Problem: Clinical Measurements: Goal: Ability to maintain clinical measurements within normal limits  will improve Outcome: Progressing   Problem: Clinical Measurements: Goal: Will remain free from infection Outcome: Progressing   Problem: Clinical Measurements: Goal: Diagnostic test results will improve Outcome: Progressing   Problem: Clinical Measurements: Goal: Respiratory complications will improve Outcome: Progressing   Problem: Clinical Measurements: Goal: Cardiovascular complication will be avoided Outcome: Progressing   Problem: Activity: Goal: Risk for activity intolerance will decrease Outcome: Progressing   Problem: Nutrition: Goal: Adequate nutrition will be maintained Outcome: Progressing   Problem: Coping: Goal: Level of anxiety will decrease Outcome: Progressing   Problem: Elimination: Goal: Will not experience complications related to bowel motility Outcome: Progressing   Problem: Elimination: Goal: Will not experience complications related to urinary retention Outcome: Progressing   Problem: Pain Managment: Goal: General experience of comfort will improve Outcome: Progressing   Problem: Safety: Goal: Ability to remain free from injury will improve Outcome: Progressing   Problem: Skin Integrity: Goal: Risk for impaired  skin integrity will decrease Outcome: Progressing   Problem: Education: Goal: Ability to describe self-care measures that may prevent or decrease complications (Diabetes Survival Skills Education) will improve Outcome: Progressing   Problem: Health Behavior/Discharge Planning: Goal: Ability to identify and utilize available resources and services will improve Outcome: Progressing   Problem: Health Behavior/Discharge Planning: Goal: Ability to manage health-related needs will improve Outcome: Progressing   Problem: Metabolic: Goal: Ability to maintain appropriate glucose levels will improve Outcome: Progressing   Problem: Nutritional: Goal: Maintenance of adequate nutrition will improve Outcome: Progressing   Problem: Tissue Perfusion: Goal: Adequacy of tissue perfusion will improve Outcome: Progressing   Problem: Skin Integrity: Goal: Risk for impaired skin integrity will decrease Outcome: Progressing

## 2023-07-17 NOTE — Progress Notes (Signed)
RT assessed pt. No resp noted at this time. Pt currently resting on 4l [place]. Will continue to monitor.

## 2023-07-18 ENCOUNTER — Other Ambulatory Visit (HOSPITAL_COMMUNITY): Payer: Self-pay

## 2023-07-18 ENCOUNTER — Ambulatory Visit: Payer: 59 | Admitting: General Practice

## 2023-07-18 DIAGNOSIS — I4891 Unspecified atrial fibrillation: Secondary | ICD-10-CM

## 2023-07-18 DIAGNOSIS — J9602 Acute respiratory failure with hypercapnia: Secondary | ICD-10-CM | POA: Diagnosis not present

## 2023-07-18 DIAGNOSIS — G9341 Metabolic encephalopathy: Secondary | ICD-10-CM | POA: Diagnosis not present

## 2023-07-18 DIAGNOSIS — A419 Sepsis, unspecified organism: Secondary | ICD-10-CM | POA: Diagnosis not present

## 2023-07-18 DIAGNOSIS — I5043 Acute on chronic combined systolic (congestive) and diastolic (congestive) heart failure: Secondary | ICD-10-CM | POA: Diagnosis not present

## 2023-07-18 DIAGNOSIS — J9621 Acute and chronic respiratory failure with hypoxia: Secondary | ICD-10-CM | POA: Diagnosis not present

## 2023-07-18 LAB — RENAL FUNCTION PANEL
Albumin: 3.8 g/dL (ref 3.5–5.0)
Anion gap: 18 — ABNORMAL HIGH (ref 5–15)
BUN: 79 mg/dL — ABNORMAL HIGH (ref 8–23)
CO2: 28 mmol/L (ref 22–32)
Calcium: 9.7 mg/dL (ref 8.9–10.3)
Chloride: 89 mmol/L — ABNORMAL LOW (ref 98–111)
Creatinine, Ser: 7.32 mg/dL — ABNORMAL HIGH (ref 0.44–1.00)
GFR, Estimated: 5 mL/min — ABNORMAL LOW (ref >60)
Glucose, Bld: 78 mg/dL (ref 70–99)
Phosphorus: 7.4 mg/dL — ABNORMAL HIGH (ref 2.5–4.6)
Potassium: 4.9 mmol/L (ref 3.5–5.1)
Sodium: 135 mmol/L (ref 135–145)

## 2023-07-18 LAB — CBC
HCT: 35.2 % — ABNORMAL LOW (ref 36.0–46.0)
Hemoglobin: 10.2 g/dL — ABNORMAL LOW (ref 12.0–15.0)
MCH: 30.3 pg (ref 26.0–34.0)
MCHC: 29 g/dL — ABNORMAL LOW (ref 30.0–36.0)
MCV: 104.5 fL — ABNORMAL HIGH (ref 80.0–100.0)
Platelets: 239 10*3/uL (ref 150–400)
RBC: 3.37 MIL/uL — ABNORMAL LOW (ref 3.87–5.11)
RDW: 15 % (ref 11.5–15.5)
WBC: 11.9 10*3/uL — ABNORMAL HIGH (ref 4.0–10.5)
nRBC: 1.7 % — ABNORMAL HIGH (ref 0.0–0.2)

## 2023-07-18 LAB — GLUCOSE, CAPILLARY
Glucose-Capillary: 122 mg/dL — ABNORMAL HIGH (ref 70–99)
Glucose-Capillary: 172 mg/dL — ABNORMAL HIGH (ref 70–99)
Glucose-Capillary: 216 mg/dL — ABNORMAL HIGH (ref 70–99)

## 2023-07-18 MED ORDER — AMIODARONE HCL IN DEXTROSE 360-4.14 MG/200ML-% IV SOLN
60.0000 mg/h | INTRAVENOUS | Status: DC
  Administered 2023-07-18 (×2): 60 mg/h via INTRAVENOUS
  Filled 2023-07-18: qty 200

## 2023-07-18 MED ORDER — METOPROLOL TARTRATE 5 MG/5ML IV SOLN
5.0000 mg | Freq: Once | INTRAVENOUS | Status: AC
  Administered 2023-07-18: 5 mg via INTRAVENOUS

## 2023-07-18 MED ORDER — AMIODARONE LOAD VIA INFUSION
150.0000 mg | Freq: Once | INTRAVENOUS | Status: AC
  Administered 2023-07-18: 150 mg via INTRAVENOUS
  Filled 2023-07-18: qty 83.34

## 2023-07-18 MED ORDER — APIXABAN 5 MG PO TABS
5.0000 mg | ORAL_TABLET | Freq: Two times a day (BID) | ORAL | Status: DC
  Administered 2023-07-18 – 2023-07-19 (×4): 5 mg via ORAL
  Filled 2023-07-18 (×4): qty 1

## 2023-07-18 MED ORDER — ALBUMIN HUMAN 25 % IV SOLN: 25.0000 g | Freq: Once | INTRAVENOUS | Status: DC

## 2023-07-18 MED ORDER — PANTOPRAZOLE SODIUM 40 MG PO TBEC
40.0000 mg | DELAYED_RELEASE_TABLET | Freq: Every day | ORAL | Status: DC
  Administered 2023-07-18 – 2023-07-19 (×2): 40 mg via ORAL
  Filled 2023-07-18 (×2): qty 1

## 2023-07-18 MED ORDER — METOPROLOL TARTRATE 25 MG PO TABS
37.5000 mg | ORAL_TABLET | Freq: Two times a day (BID) | ORAL | Status: DC
  Administered 2023-07-18 – 2023-07-19 (×3): 37.5 mg via ORAL
  Filled 2023-07-18 (×3): qty 1

## 2023-07-18 MED ORDER — AMIODARONE HCL IN DEXTROSE 360-4.14 MG/200ML-% IV SOLN
30.0000 mg/h | INTRAVENOUS | Status: DC
  Administered 2023-07-18 – 2023-07-19 (×2): 30 mg/h via INTRAVENOUS
  Filled 2023-07-18 (×2): qty 200

## 2023-07-18 NOTE — Progress Notes (Addendum)
PROGRESS NOTE        PATIENT DETAILS Name: [name] [name] Age: 72 y.o. Sex: female Date of Birth: 02/08/1951 Admit Date: 07/11/2023 Admitting Physician Josephine Igo, DO BMW:UXLK, [name], FNP  Brief Summary: Patient is a 72 y.o.  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  She was stabilized and transferred to [facility] on 10/9.    Significant events: 10/3>> admit to PCCM 10/9>> transferred to [facility]  Significant studies: 10/3>> CT head: No acute intracranial abnormality 10/3>> CT chest: Progressive enlargement of the soft tissue in prevascular space-suspicious for metastatic disease.  Unchanged left greater than right pleural effusion.  New groundglass opacities left upper lobe.  Significant microbiology data: 10/4>> blood culture: No growth  Procedures: None  Consults: PCCM Nephrology  Subjective: No new complaints overnight-feels better-completely awake alert this morning.  No family at bedside.  Objective: Vitals: Blood pressure 113/64, pulse 95, temperature 98.1 F (36.7 C), resp. rate (!) 24, height 5\' 3"  (1.6 m), weight 101.1 kg, SpO2 97%.   Exam: Gen Exam:Alert awake-not in any distress HEENT:atraumatic, normocephalic Chest: B/L clear to auscultation anteriorly CVS:S1S2 regular Abdomen:soft non tender, non distended Extremities:no edema Neurology: Non focal-but has generalized weakness in her lower extremities at baseline. Skin: no rash  Pertinent  Labs/Radiology:    Latest Ref Rng & Units 07/18/2023    5:58 AM 07/17/2023    3:31 AM 07/16/2023    2:42 AM  CBC  WBC 4.0 - 10.5 K/uL 11.9  10.5  11.2   Hemoglobin 12.0 - 15.0 g/dL 44.0  10.2  72.5   Hematocrit 36.0 - 46.0 % 35.2  35.8  35.8   Platelets 150 - 400 K/uL 239  248  298     Lab Results  Component Value Date   NA 135 07/18/2023   K 4.9 07/18/2023   CL 89 (L) 07/18/2023   CO2 28 07/18/2023    Assessment/Plan: Acute on chronic hypoxemic and hypercarbic respiratory failure due to a combination of volume overload and COPD exacerbation (on 3 L of oxygen at baseline) Improved with bronchodilators/HD sessions Down to 3 L of oxygen Continue to treat underlying etiologies-continue pulmonary toileting.  Acute metabolic encephalopathy Secondary to hypercarbia/hypoxemia Improved with BiPAP Refuses to use BiPAP here in 5 W.-claims intolerant.  Understands life-threatening/life disabling consequences.  Culture negative septic shock Sepsis physiology has resolved All cultures negative Per PCCM note on 10/8-plan is to continue 7 days of empiric Augmentin/Zyvox.  SVT Occurred while in the ICU Maintaining sinus rhythm this morning Continue metoprolol.  Addendum Tachycardia-looks like A-fib with RVR after hemodialysis-did not get her beta-blocker dose this morning-as she went to hemodialysis early in the morning. BP soft but stable in the low 100s Given 5 mg of IV Lopressor-heart rate down to the 120s CHA2DS2-VASc of at least 6-will start Eliquis Nurse to give oral beta-blocker dosage now. Cardiology consulted.  Acute on chronic HFrEF (EF 35-40% by TTE on 04/15/2023) Euvolemic on exam  this morning Volume removal with hemodialysis. On midodrine-with BP soft but stable.  Nonobstructive CAD on recent LHC on 9/27 No chest pain Plavix/beta-blocker/statin  History of PAD Antiplatelet/statin  ESRD on HD TTS Nephrology following  Normocytic anemia Secondary to combination  of ESRD/critical illness Aranesp/iron deferred to nephrology service Follow CBC  Hypothyroidism Synthroid TSH 10/3 stable  DM-2 (A1c 7.1 on 7/6) CBGs stable-Semglee 35 units daily, 7 units of NovoLog with meals and SSI   Recent Labs    07/17/23 1230 07/17/23 1630 07/17/23 2054  GLUCAP 153* 206* 152*     History of lung cancer-s/p radiation Will need outpatient follow-up with oncology and PET scan.  Some concern for metastatic disease on CT imaging done on admission.  Osteoarthritis of bilateral knees Stable at present-outpatient orthopedic evaluation  Constipation Had BM overnight Continue MiraLAX/senna  Debility/deconditioning Per patient-she has not ambulated in close to a month-previously minimally ambulatory and mostly bed to wheelchair bound PT/OT eval-Home health recommended.  Nutrition Status: Nutrition Problem: Increased nutrient needs Etiology: chronic illness (ESRD on HD) Signs/Symptoms: estimated needs Interventions: Refer to RD note for recommendations   Morbid Obesity: Estimated body mass index is 39.48 kg/m as calculated from the following:   Height as of this encounter: 5\' 3"  (1.6 m).   Weight as of this encounter: 101.1 kg.   Code status:   Code Status: Limited: Do not attempt resuscitation (DNR) -DNR-LIMITED -Do Not Intubate/DNI    DVT Prophylaxis: heparin injection 5,000 Units Start: 07/11/23 1700    Family Communication: [name] [phone] -updated by phone 10/10   Disposition Plan: Status is: Inpatient Remains inpatient appropriate because: Severity of illness   Planned Discharge Destination:Home health    Diet: Diet Order             Diet renal/carb modified with fluid restriction Diet-HS Snack? Nothing; Fluid restriction: 1200 mL Fluid; Room service appropriate? Yes; Fluid consistency: Thin  Diet effective now                     Antimicrobial agents: Anti-infectives (From admission, onward)    Start      Dose/Rate Route Frequency Ordered Stop   07/14/23 1000  linezolid (ZYVOX) tablet 600 mg        600 mg Oral Every 12 hours 07/14/23 0838 07/18/23 2359   07/13/23 2200  linezolid (ZYVOX) tablet 600 mg  Status:  Discontinued        600 mg Per Tube Every 12 hours 07/13/23 1910 07/14/23 0838   07/13/23 2000  amoxicillin-clavulanate (AUGMENTIN) 500-125 MG per tablet 1 tablet        1 tablet Per Tube Every 24 hours 07/13/23 1910 07/18/23 2359   07/13/23 1700  vancomycin (VANCOCIN) IVPB 1000 mg/200 mL premix  Status:  Discontinued        1,000 mg 200 mL/hr over 60 Minutes Intravenous Every Sat (Hemodialysis) 07/13/23 1118 07/13/23 1910   07/12/23 1252  vancomycin variable dose per unstable renal function (pharmacist dosing)  Status:  Discontinued         Does not apply See admin instructions 07/12/23 1253 07/13/23 1910   07/12/23 1015  piperacillin-tazobactam (ZOSYN) IVPB 2.25 g  Status:  Discontinued        2.25 g 100 mL/hr over 30 Minutes Intravenous Every 8 hours 07/12/23 0922 07/13/23 1910   07/12/23 1015  vancomycin (VANCOREADY) IVPB 2000 mg/400 mL        2,000 mg 200 mL/hr over 120 Minutes Intravenous  Once 07/12/23  7846 07/12/23 1207        MEDICATIONS: Scheduled Meds:  amoxicillin-clavulanate  1 tablet Per Tube Q24H   arformoterol  15 mcg Nebulization Q12H   budesonide (PULMICORT) nebulizer solution  0.5 mg Nebulization BID   Chlorhexidine Gluconate Cloth  6 each Topical Q0600   Chlorhexidine Gluconate Cloth  6 each Topical Q0600   clopidogrel  75 mg Oral Daily   feeding supplement (NEPRO CARB STEADY)  237 mL Oral BID BM   heparin  5,000 Units Subcutaneous Q8H   insulin aspart  0-20 Units Subcutaneous TID WC   insulin aspart  7 Units Subcutaneous TID WC   insulin glargine-yfgn  35 Units Subcutaneous Daily   levothyroxine  175 mcg Oral Q0600   linezolid  600 mg Oral Q12H   melatonin  3 mg Oral QHS   metoprolol tartrate  37.5 mg Oral BID   midodrine  15 mg Oral Q8H    multivitamin  1 tablet Oral QHS   polyethylene glycol  17 g Oral Daily   revefenacin  175 mcg Nebulization Daily   rosuvastatin  20 mg Oral Daily   senna-docusate  2 tablet Oral QHS   Continuous Infusions:  albumin human     anticoagulant sodium citrate     PRN Meds:.acetaminophen, alteplase, anticoagulant sodium citrate, feeding supplement (NEPRO CARB STEADY), heparin, ipratropium-albuterol, lidocaine (PF), lidocaine-prilocaine, metoprolol tartrate, ondansetron (ZOFRAN) IV, mouth rinse, pentafluoroprop-tetrafluoroeth, polyethylene glycol, white petrolatum   I have personally reviewed following labs and imaging studies  LABORATORY DATA: CBC: Recent Labs  Lab 07/11/23 1321 07/11/23 1333 07/14/23 0244 07/15/23 0345 07/16/23 0242 07/17/23 0331 07/18/23 0558  WBC 10.8*   < > 12.8* 12.2* 11.2* 10.5 11.9*  NEUTROABS 9.0*  --   --   --   --   --   --   HGB 11.8*   < > 10.3* 10.8* 10.6* 10.4* 10.2*  HCT 42.1   < > 34.2* 34.9* 35.8* 35.8* 35.2*  MCV 112.6*   < > 103.6* 102.6* 103.5* 107.8* 104.5*  PLT 237   < > 269 240 298 248 239   < > = values in this interval not displayed.    Basic Metabolic Panel: Recent Labs  Lab 07/12/23 1749 07/13/23 0753 07/13/23 1505 07/14/23 0244 07/15/23 0345 07/16/23 0242 07/17/23 0331 07/18/23 0558  NA  --   --    < > 132* 132* 133* 135 135  K  --   --    < > 3.6 5.1 5.2* 4.7 4.9  CL  --   --    < > 90* 92* 91* 92* 89*  CO2  --   --    < > 30 24 26 27 28   GLUCOSE  --   --    < > 283* 175* 152* 83 78  BUN  --   --    < > 40* 62* 76* 55* 79*  CREATININE  --   --    < > 4.12* 5.50* 7.04* 5.47* 7.32*  CALCIUM  --   --    < > 9.3 9.9 9.8 9.9 9.7  MG 2.1 2.1  --   --  2.0 2.1 2.0  --   PHOS 5.6* 5.4*   < > 3.0 5.2* 7.2* 7.0* 7.4*   < > = values in this interval not displayed.    GFR: Estimated Creatinine Clearance: 7.9 mL/min (A) (by C-G formula based on SCr of 7.32 mg/dL (H)).  Liver Function Tests: Recent Labs  Lab 07/11/23 1321  07/13/23 1505 07/14/23 0244 07/16/23 0242 07/17/23 0331 07/18/23 0558  AST 23  --   --   --   --   --   ALT 13  --   --   --   --   --   ALKPHOS 81  --   --   --   --   --   BILITOT 0.4  --   --   --   --   --   PROT 7.4  --   --   --   --   --   ALBUMIN 3.6 2.9* 3.0* 3.5 3.8 3.8   No results for input(s): "LIPASE", "AMYLASE" in the last 168 hours. No results for input(s): "AMMONIA" in the last 168 hours.  Coagulation Profile: No results for input(s): "INR", "PROTIME" in the last 168 hours.  Cardiac Enzymes: No results for input(s): "CKTOTAL", "CKMB", "CKMBINDEX", "TROPONINI" in the last 168 hours.  BNP (last 3 results) No results for input(s): "PROBNP" in the last 8760 hours.  Lipid Profile: No results for input(s): "CHOL", "HDL", "LDLCALC", "TRIG", "CHOLHDL", "LDLDIRECT" in the last 72 hours.  Thyroid Function Tests: No results for input(s): "TSH", "T4TOTAL", "FREET4", "T3FREE", "THYROIDAB" in the last 72 hours.  Anemia Panel: No results for input(s): "VITAMINB12", "FOLATE", "FERRITIN", "TIBC", "IRON", "RETICCTPCT" in the last 72 hours.  Urine analysis:    Component Value Date/Time   COLORURINE YELLOW 11/27/2021 0956   APPEARANCEUR HAZY (A) 11/27/2021 0956   LABSPEC 1.012 11/27/2021 0956   PHURINE 5.0 11/27/2021 0956   GLUCOSEU NEGATIVE 11/27/2021 0956   HGBUR NEGATIVE 11/27/2021 0956   BILIRUBINUR NEGATIVE 11/27/2021 0956   KETONESUR NEGATIVE 11/27/2021 0956   PROTEINUR 30 (A) 11/27/2021 0956   UROBILINOGEN 0.2 08/04/2014 1409   NITRITE NEGATIVE 11/27/2021 0956   LEUKOCYTESUR NEGATIVE 11/27/2021 0956    Sepsis Labs: Lactic Acid, Venous    Component Value Date/Time   LATICACIDVEN 0.9 07/11/2023 1334    MICROBIOLOGY: Recent Results (from the past 240 hour(s))  MRSA Next Gen by PCR, Nasal     Status: None   Collection Time: 07/11/23  5:32 PM   Specimen: Nasal Mucosa; Nasal Swab  Result Value Ref Range Status   MRSA by PCR Next Gen NOT DETECTED NOT  DETECTED Final    Comment: (NOTE) The GeneXpert MRSA Assay (FDA approved for NASAL specimens only), is one component of a comprehensive MRSA colonization surveillance program. It is not intended to diagnose MRSA infection nor to guide or monitor treatment for MRSA infections. Test performance is not FDA approved in patients less than 59 years old. Performed at [facility], [address], [place], [place] [address]   Culture, blood (Routine X 2) w Reflex to ID Panel     Status: None   Collection Time: 07/12/23  8:38 AM   Specimen: BLOOD  Result Value Ref Range Status   Specimen Description BLOOD BLOOD RIGHT HAND  Final   Special Requests   Final    AEROBIC BOTTLE ONLY Blood Culture results may not be optimal due to an inadequate volume of blood received in culture bottles   Culture   Final    NO GROWTH 5 DAYS Performed at [facility], [address] [place], [place] [address]    Report Status 07/17/2023 FINAL  Final  Culture, blood (Routine X 2) w Reflex to ID Panel     Status: None   Collection Time: 07/12/23  8:41 AM  Specimen: BLOOD  Result Value Ref Range Status   Specimen Description BLOOD BLOOD RIGHT HAND  Final   Special Requests   Final    AEROBIC BOTTLE ONLY Blood Culture results may not be optimal due to an inadequate volume of blood received in culture bottles   Culture   Final    NO GROWTH 5 DAYS Performed at [facility], [address] [place], [place] [address]    Report Status 07/17/2023 FINAL  Final    RADIOLOGY STUDIES/RESULTS: No results found.   LOS: 7 days   Jeoffrey Massed, MD  [facility]    To contact the attending provider between 7A-7P or the covering provider during after hours 7P-7A, please log into the web site [email or web address] and access using universal [place] password for that web site. If you do not have the password, please call the hospital operator.  07/18/2023, 12:13 PM

## 2023-07-18 NOTE — Progress Notes (Signed)
RT note. Patient currently on 3L [place] sat 98% with no labored breathing noted. Patient not requiring bipap at this time, Patients order for at bedtime/prn. Patient also refusing CPAP for night time use. RT will continue to monitor.    07/18/23 0734  Aerosol Therapy Tx  $ Hand Held Nebulizer  2  Medications Brovana;[facility]  Delivery Source Air  Delivery Device HHN  Mid-Treatment Pulse 91  Post-Treatment Pulse 91  Post-Treatment Respirations 18  Treatment Tolerance Tolerated well  Treatment Given 2  RT Breath Sounds  Bilateral Breath Sounds Diminished  Oxygen Therapy/Pulse Ox  O2 Device Nasal Cannula  O2 Therapy Oxygen  O2 Flow Rate (L/min) 3 L/min (home baseline)  SpO2 98 %

## 2023-07-18 NOTE — Consult Note (Signed)
Cardiology Consultation   Patient ID: ALABAMA LLEWELYN MRN: [id number]; DOB: March 23, 1951  Admit date: 07/11/2023 Date of Consult: 07/18/2023  PCP:  Vladimir Crofts, FNP   [facility] Providers Cardiologist:  Nanetta Batty, MD  Advanced Heart Failure:  Marca Ancona, MD       Patient Profile:   [name] [name] is a 72 y.o. female with a hx of HFrEF, nonobstructive CAD, diabetes, HTN, HLD, ESRD on HD TTS, OSA, COPD on 3LNC, peripheral vascular disease, prior TIA, non-small cell lung cancer s/p curative radiation /in remission who is being seen 07/18/2023 for the evaluation of afib with RVR at the request of Dr. Jerral Ralph.  History of Present Illness:   [name] [name] presented to the ED on 10/3 from dialysis after she was noted to be profoundly drowsy upon the completion of her session. In the ED, patient obtunded and difficult to arouse. She was placed on BiPAP with improvement in mentation. ABG with respiratory acidosis (pH 7.169, pCO2 97.6, pO2 54). CXR concerning for fluid overload. Patient tolerated BiPAP for about 30 minutes then self-discontinued. Later in the ED, patient again became obtunded with concurrent hypotension necessitating peripheral levophed and she was admitted to the ICU. Hypotension attributed to respiratory acidosis but there was concern for infection as well. In the ICU, patient kept on BiPAP as much as tolerated and had improvement in ABG. By 10/5, patient no longer requiring BiPAP. Given elevated procalcitonin and leukocytosis, abx continued. She was started on Midodrine on 10/5 to support BP. On 10/6, patient had chest pain and was found to have troponin increase from time of admission. On 10/8 prior to transfer to progressive bed, patient developed SVT into the 180s. Vagal maneuvers temporarily relieved rates and definitive rate control followed carotid massage. Today on 10/10 after returning from HD, patient found to be tachycardic with rhythm consistent with  afib/RVR. Patient did not received morning beta blocker prior to dialysis. Hospitalist gave lopressor and patient with reduced rates.   On exam, patient with ongoing afib with RVR, ventricular rates in the 120s. She is very lethargic on exam, trails off mid answer frequently. Limited review of symptoms as a result of this. Patient reports that she felt onset of rapid HR during dialysis today along with some chest tightness. Both have improved with better rate control. No other symptoms reported (but again limited history by patient).    Past Medical History:  Diagnosis Date   Anemia    Arthritis    Asthma    Chronic diastolic (congestive) heart failure (HCC)    Chronic kidney disease    Dialysis T-TH-Sat   Chronic pain syndrome    Class 2 obesity due to excess calories with body mass index (BMI) of 36.0 to 36.9 in adult    COPD (chronic obstructive pulmonary disease) (HCC)    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   Full dentures    GERD (gastroesophageal reflux disease)    Gout    History of hiatal hernia    History of transfusion    Hx MRSA infection    abscess left groin   Hyperlipidemia    Hypertension    Hypothyroid    Intractable nausea and vomiting 02/15/2021   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HCC)    Peripheral vascular disease (HCC)    Sleep apnea    UNABLE TO TOLERATE C PAP   Stress incontinence    TIA (transient ischemic attack)  X2 NO RESIDUAL PROBLEMS   Wears glasses     Past Surgical History:  Procedure Laterality Date   ABDOMINAL HYSTERECTOMY     AV FISTULA PLACEMENT Left 12/01/2021   Procedure: INSERTION OF LEFT ARM ARTERIOVENOUS (AV) GORE-TEX GRAFT;  Surgeon: Nada Libman, MD;  Location: [facility] OR;  Service: Vascular;  Laterality: Left;   BACK SURGERY  2020   BASCILIC VEIN TRANSPOSITION Left 04/05/2020   Procedure: BASILIC VEIN TRANSPOSITION FIRST STAGE;  Surgeon: Chuck Hint, MD;  [facility];  Service:  Vascular;  Laterality: Left;   BRONCHIAL BIOPSY  01/23/2022   Procedure: BRONCHIAL BIOPSIES;  Surgeon: Josephine Igo, DO;  Location: [facility] ENDOSCOPY;  Service: Pulmonary;;   BRONCHIAL BIOPSY  08/07/2022   Procedure: BRONCHIAL BIOPSIES;  Surgeon: Josephine Igo, DO;  Location: [facility]OS[facility];  Service: Pulmonary;;   BRONCHIAL BRUSHINGS  08/07/2022   Procedure: BRONCHIAL BRUSHINGS;  Surgeon: Josephine Igo, DO;  Location: [facility];  Service: Pulmonary;;   BRONCHIAL NEEDLE ASPIRATION BIOPSY  01/23/2022   Procedure: BRONCHIAL NEEDLE ASPIRATION BIOPSIES;  Surgeon: Josephine Igo, DO;  Location: [facility];  Service: Pulmonary;;   BRONCHIAL NEEDLE ASPIRATION BIOPSY  08/07/2022   Procedure: BRONCHIAL NEEDLE ASPIRATION BIOPSIES;  Surgeon: Josephine Igo, DO;  Location: [facility];  Service: Pulmonary;;   COLON SURGERY  1995   DUE TO POLYP   DILATION AND CURETTAGE OF UTERUS     ENDOBRONCHIAL ULTRASOUND  01/23/2022   Procedure: ENDOBRONCHIAL ULTRASOUND;  Surgeon: Josephine Igo, DO;  Location: [facility];  Service: Pulmonary;;   ENTEROSCOPY N/A 07/10/2021   Procedure: ENTEROSCOPY;  Surgeon: Kerin Salen, MD;  Location: [facility] ENDOSCOPY;  Service: Gastroenterology;  Laterality: N/A;   ESOPHAGOGASTRODUODENOSCOPY N/A 01/25/2021   Procedure: ESOPHAGOGASTRODUODENOSCOPY (EGD);  Surgeon: Vida Rigger, MD;  Location: [facility] ENDOSCOPY;  Service: Endoscopy;  Laterality: N/A;   FINE NEEDLE ASPIRATION  01/23/2022   Procedure: FINE NEEDLE ASPIRATION (FNA) LINEAR;  Surgeon: Josephine Igo, DO;  [facility]: [facility];  Service: Pulmonary;;   FOREARM FRACTURE SURGERY     Left arm   GIVENS CAPSULE STUDY N/A 07/07/2021   Procedure: GIVENS CAPSULE STUDY;  Surgeon: Vida Rigger, MD;  Location: [facility] ENDOSCOPY;  Service: Endoscopy;  Laterality: N/A;   HERNIA REPAIR     w/ mesh   HOT HEMOSTASIS N/A 07/10/2021   Procedure: HOT HEMOSTASIS (ARGON PLASMA COAGULATION/BICAP);  Surgeon: Kerin Salen, MD;  Location: [facility]  ENDOSCOPY;  Service: Gastroenterology;  Laterality: N/A;   INCISION AND DRAINAGE ABSCESS Left 02/04/2013   Procedure: INCISION AND DRAINAGE LEFT BUTTOCK ABSCESS; INCISION AND DRAINAGE LEFT BREAST ABSCESS;  Surgeon: Shelly Rubenstein, MD;  Location: [facility];  Service: General;  Laterality: Left;   INCISION AND DRAINAGE ABSCESS N/A 02/12/2013   Procedure: INCISION AND DEBRIDEMENT BUTTOCK WOUND ;  Surgeon: Wilmon Arms. Corliss Skains, MD;  Location: [facility];  Service: General;  Laterality: N/A;   INCISION AND DRAINAGE ABSCESS N/A 02/14/2013   Procedure: INCISION AND DRAINAGE/DRESSING CHANGE;  Surgeon: Shelly Rubenstein, MD;  Location: [facility];  Service: General;  Laterality: N/A;   INCISION AND DRAINAGE ABSCESS N/A 03/21/2015   Procedure: INCISION AND DRAINAGE PUBIC ABSCESS;  Surgeon: Glenna Fellows, MD;  Location: [facility] ORS;  Service: General;  Laterality: N/A;   INCISION AND DRAINAGE PERIRECTAL ABSCESS Left 02/10/2013   Procedure: IRRIGATION AND DEBRIDEMENT OF BUTTOCK/PERINEAL ABSCESS;  Surgeon: Wilmon Arms. Corliss Skains, MD;  Location: [facility];  Service: General;  Laterality: Left;   INCISION AND DRAINAGE PERIRECTAL ABSCESS  N/A 02/16/2013   Procedure: IRRIGATION AND DEBRIDEMENT PERINEAL ABSCESS;  Surgeon: Liz Malady, MD;  [facility]: [facility];  Service: General;  Laterality: N/A;   IR FLUORO GUIDE CV LINE RIGHT  11/30/2021   IR US GUIDE VASC ACCESS RIGHT  11/30/2021   IRRIGATION AND DEBRIDEMENT ABSCESS N/A 02/06/2013   Procedure: IRRIGATION AND DEBRIDEMENT BUTTOCK ABSCESS AND DRESSING CHANGE;  Surgeon: Shelly Rubenstein, MD;  [facility];  Service: General;  Laterality: N/A;   IRRIGATION AND DEBRIDEMENT ABSCESS Left 02/08/2013   Procedure: IRRIGATION AND DEBRIDEMENT ABSCESS/DRESSING CHANGE;  Surgeon: Cherylynn Ridges, MD;  Location: [facility];  Service: General;  Laterality: Left;   JOINT REPLACEMENT  2010 / 2012    LAPAROSCOPIC CHOLECYSTECTOMY     LEFT HEART CATH AND CORONARY ANGIOGRAPHY N/A 07/05/2023   Procedure: LEFT HEART CATH AND  CORONARY ANGIOGRAPHY;  Surgeon: Lennette Bihari, MD;  [facility] INVAS[facility] CV LAB;  Service: Cardiovascular;  Laterality: N/A;   LEFT HEART CATHETERIZATION WITH CORONARY ANGIOGRAM N/A 07/02/2014   Procedure: LEFT HEART CATHETERIZATION WITH CORONARY ANGIOGRAM;  Surgeon: Runell Gess, MD;  Location: [facility] CATH LAB;  Service: Cardiovascular;  Laterality: N/A;   LOWER EXTREMITY ANGIOGRAM Right 04/20/2016   Procedure: Lower Extremity Angiogram;  Surgeon: Sherren Kerns, MD;  Location: [facility] INVASIVE CV LAB;  Service: Cardiovascular;  Laterality: Right;   MULTIPLE TOOTH EXTRACTIONS     PERIPHERAL VASCULAR CATHETERIZATION N/A 04/20/2016   Procedure: Abdominal Aortogram;  Surgeon: Sherren Kerns, MD;  Location: [facility] INVASIVE CV LAB;  Service: Cardiovascular;  Laterality: N/A;   PERIPHERAL VASCULAR CATHETERIZATION Right 04/20/2016   Procedure: Peripheral Vascular Intervention;  Surgeon: Sherren Kerns, MD;  Location: [facility] INVASIVE CV LAB;  Service: Cardiovascular;  Laterality: Right;  popiteal   RIGHT HEART CATH N/A 10/27/2018   Procedure: RIGHT HEART CATH;  Surgeon: Laurey Morale, MD;  Location: [facility] INVASIVE CV LAB;  Service: Cardiovascular;  Laterality: N/A;   RIGHT HEART CATH N/A 03/20/2019   Procedure: RIGHT HEART CATH;  Surgeon: Laurey Morale, MD;  Location: [facility] INVASIVE CV LAB;  Service: Cardiovascular;  Laterality: N/A;   THYROIDECTOMY     TOTAL HIP REVISION Right 08/11/2014   Procedure: RIGHT ACETABULAR REVISION;  Surgeon: Loanne Drilling, MD;  Location: [facility] ORS;  Service: Orthopedics;  Laterality: Right;   VASCULAR SURGERY     VIDEO BRONCHOSCOPY WITH ENDOBRONCHIAL ULTRASOUND Bilateral 08/07/2022   Procedure: VIDEO BRONCHOSCOPY WITH ENDOBRONCHIAL ULTRASOUND;  Surgeon: Josephine Igo, DO;  Location: [facility] ENDOS[facility];  Service: Pulmonary;  Laterality: Bilateral;   VIDEO BRONCHOSCOPY WITH RADIAL ENDOBRONCHIAL ULTRASOUND  01/23/2022   Procedure: RADIAL ENDOBRONCHIAL ULTRASOUND;  Surgeon: Josephine Igo,  DO;  Location: [facility] ENDOS[facility];  Service: Pulmonary;;     Home Medications:  Prior to Admission medications   Medication Sig Start Date End Date Taking? Authorizing Provider  acetaminophen (TYLENOL) 500 MG tablet Take 1 tablet (500 mg total) by mouth See admin instructions. Take 1,000mg  (2 tablets) by mouth twice a day in addition to 1,000mg  (2 tablets) twice daily as needed for pain. 05/14/23  Yes Ghimire, Werner Lean, MD  albuterol (PROVENTIL) (2.5 MG/3ML) 0.083% nebulizer solution Take 3 mLs (2.5 mg total) by nebulization every 6 (six) hours as needed for wheezing or shortness of breath. 05/14/23  Yes Ghimire, Werner Lean, MD  albuterol (VENTOLIN HFA) 108 (90 Base) MCG/ACT inhaler Inhale 2 puffs into the lungs every 6 (six) hours as needed for shortness of breath (COPD). 05/14/23  Yes Ghimire,  Werner Lean, MD  allopurinol (ZYLOPRIM) 100 MG tablet Take 1 tablet (100 mg total) by mouth daily. 05/14/23  Yes Ghimire, Werner Lean, MD  budesonide-formoterol (SYMBICORT) 160-4.5 MCG/ACT inhaler Inhale 2 puffs into the lungs in the morning and at bedtime. 05/14/23  Yes Ghimire, Werner Lean, MD  carboxymethylcellulose (REFRESH PLUS) 0.5 % SOLN Place 1 drop into both eyes 3 (three) times daily as needed (dry/irritated eyes.).   Yes [provider]  carvedilol (COREG) 6.25 MG tablet Take 1 tablet (6.25 mg total) by mouth 2 (two) times daily with a meal. 04/17/23  Yes Vann, Jessica U, DO  clopidogrel (PLAVIX) 75 MG tablet Take 1 tablet (75 mg total) by mouth daily. 05/14/23  Yes Ghimire, Werner Lean, MD  diclofenac Sodium (VOLTAREN) 1 % GEL Apply 2 g topically 4 (four) times daily. 04/17/23  Yes Marlin Canary U, DO  docusate sodium (COLACE) 100 MG capsule Take 1 capsule (100 mg total) by mouth daily. 05/14/23  Yes Ghimire, Werner Lean, MD  fluticasone (FLONASE) 50 MCG/ACT nasal spray Place 1 spray into both nostrils daily. 05/14/23  Yes Ghimire, Werner Lean, MD  insulin lispro (HUMALOG KWIKPEN) 100 UNIT/ML KwikPen 0-15 Units,  Subcutaneous, 3 times daily with meal CBG < 70: Implement Hypoglycemia meausers CBG 70 - 120: 0 units CBG 121 - 150: 2 units CBG 151 - 200: 3 units CBG 201 - 250: 5 units CBG 251 - 300: 8 units CBG 301 - 350: 11 units CBG 351 - 400: 15 units CBG > 400: call MD Patient taking differently: Inject 0-15 Units into the skin 3 (three) times daily. 0-15 Units, Subcutaneous, 3 times daily with meal CBG < 70: Implement Hypoglycemia meausers CBG 70 - 120: 0 units CBG 121 - 150: 2 units CBG 151 - 200: 3 units CBG 201 - 250: 5 units CBG 251 - 300: 8 units CBG 301 - 350: 11 units CBG 351 - 400: 15 units CBG > 400: call MD 05/14/23  Yes Ghimire, Werner Lean, MD  levothyroxine (SYNTHROID) 175 MCG tablet Take 1 tablet (175 mcg total) by mouth daily. 05/14/23  Yes Ghimire, Werner Lean, MD  lidocaine 4 % Place 2 patches onto the skin daily. Each lower side of back. 05/14/23  Yes Ghimire, Werner Lean, MD  lidocaine-prilocaine (EMLA) cream Apply 1 Application topically See admin instructions. On dialysis days (Tues,Thurs,Sat) 02/13/22  Yes [provider]  meclizine (ANTIVERT) 25 MG tablet Take 25 mg by mouth daily as needed. 06/12/23  Yes [provider]  Methoxy PEG-Epoetin Beta (MIRCERA IJ) Inject 1 Syringe into the skin every 14 (fourteen) days. 06/01/23 05/30/24 Yes [provider]  Multiple Vitamins-Iron (MULTIVITAMINS WITH IRON) TABS tablet Take 1 tablet by mouth in the morning. 05/14/23  Yes Ghimire, Werner Lean, MD  nitroGLYCERIN (NITROSTAT) 0.4 MG SL tablet Place 1 tablet (0.4 mg total) under the tongue every 5 (five) minutes as needed for chest pain. 07/06/23 07/05/24 Yes Cyndi Bender, NP  ondansetron (ZOFRAN-ODT) 4 MG disintegrating tablet Take 2 mg by mouth every 6 (six) hours as needed for nausea or vomiting. 07/10/23  Yes [provider]  pantoprazole (PROTONIX) 40 MG tablet Take 1 tablet (40 mg total) by mouth 2 (two) times daily before a meal. 05/14/23  Yes Ghimire, Werner Lean, MD  pregabalin  (LYRICA) 25 MG capsule Take 25 mg by mouth daily. 05/29/23  Yes [provider]  rosuvastatin (CRESTOR) 20 MG tablet Take 1 tablet (20 mg total) by mouth daily. 07/06/23  Yes Dion Body,  Chapman Fitch, NP  sevelamer carbonate (RENVELA) 800 MG tablet Take 2 tablets (1,600 mg total) by mouth 3 (three) times daily with meals. 05/14/23  Yes Ghimire, Werner Lean, MD  thiamine (VITAMIN B1) 100 MG tablet Take 1 tablet (100 mg total) by mouth in the morning. 05/14/23  Yes Ghimire, Werner Lean, MD  umeclidinium bromide (INCRUSE ELLIPTA) 62.5 MCG/ACT AEPB Inhale 1 puff into the lungs daily. 05/14/23  Yes Ghimire, Werner Lean, MD  Nutritional Supplements (FEEDING SUPPLEMENT, NEPRO CARB STEADY,) LIQD Take 237 mLs by mouth 2 (two) times daily between meals. 04/17/23   Joseph Art, DO  OXYGEN Inhale into the lungs.    [provider]    Inpatient Medications: Scheduled Meds:  amoxicillin-clavulanate  1 tablet Per Tube Q24H   apixaban  5 mg Oral BID   arformoterol  15 mcg Nebulization Q12H   budesonide (PULMICORT) nebulizer solution  0.5 mg Nebulization BID   Chlorhexidine Gluconate Cloth  6 each Topical Q0600   Chlorhexidine Gluconate Cloth  6 each Topical Q0600   clopidogrel  75 mg Oral Daily   feeding supplement (NEPRO CARB STEADY)  237 mL Oral BID BM   insulin aspart  0-20 Units Subcutaneous TID WC   insulin aspart  7 Units Subcutaneous TID WC   insulin glargine-yfgn  35 Units Subcutaneous Daily   levothyroxine  175 mcg Oral Q0600   linezolid  600 mg Oral Q12H   melatonin  3 mg Oral QHS   metoprolol tartrate  37.5 mg Oral BID   midodrine  15 mg Oral Q8H   multivitamin  1 tablet Oral QHS   pantoprazole  40 mg Oral Q1200   polyethylene glycol  17 g Oral Daily   revefenacin  175 mcg Nebulization Daily   rosuvastatin  20 mg Oral Daily   senna-docusate  2 tablet Oral QHS   Continuous Infusions:  albumin human     PRN Meds: acetaminophen, ipratropium-albuterol, metoprolol tartrate, ondansetron (ZOFRAN)  IV, mouth rinse, polyethylene glycol, white petrolatum  Allergies:    Allergies  Allergen Reactions   Nebivolol Swelling    Chest pain (reaction to Bystolic)   Zestril [Lisinopril] Swelling and Other (See Comments)   Ace Inhibitors Swelling and Other (See Comments)    Tongue swell   Morphine And Codeine Itching    Social History:   Social History   Socioeconomic History   Marital status: Legally Separated    Spouse name: Not on file   Number of children: 1   Years of education: college   Highest education level: Not on file  Occupational History    Comment: Disabled  Tobacco Use   Smoking status: Former    Current packs/day: 0.00    Average packs/day: 0.3 packs/day for 40.0 years (10.0 ttl pk-yrs)    Types: Cigarettes    Start date: 10/1981    Quit date: 10/2021    Years since quitting: 1.7    Passive exposure: Past   Smokeless tobacco: Former  Building services engineer status: Never Used  Substance and Sexual Activity   Alcohol use: Yes    Alcohol/week: 1.0 standard drink of alcohol    Types: 1 Standard drinks or equivalent per week    Comment: OCC   Drug use: No   Sexual activity: Not Currently    Birth control/protection: Surgical    Comment: Hysterectomy  Other Topics Concern   Not on file  Social History Narrative   ** Merged History Encounter **  Patient is separated  and lives at home with her friend. Patient is disabled. Education one year of college Right handed Caffeine two cups daily.     Social Determinants of Health   Financial Resource Strain: Not on file  Food Insecurity: No Food Insecurity (07/05/2023)   Hunger Vital Sign    Worried About Running Out of Food in the Last Year: Never true    Ran Out of Food in the Last Year: Never true  Transportation Needs: No Transportation Needs (07/05/2023)   PRAPARE - Administrator, Civil Service (Medical): No    Lack of Transportation (Non-Medical): No  Physical Activity: Not on file   Stress: Not on file  Social Connections: Not on file  Intimate Partner Violence: Not At Risk (07/05/2023)   Humiliation, Afraid, Rape, and Kick questionnaire    Fear of Current or Ex-Partner: No    Emotionally Abused: No    Physically Abused: No    Sexually Abused: No    Family History:    Family History  Problem Relation Age of Onset   Diabetes Brother    Cardiomyopathy Mother    Heart disease Mother    Cancer Father      ROS:  Please see the history of present illness.   All other ROS reviewed and negative.     Physical Exam/Data:   Vitals:   07/18/23 1138 07/18/23 1200 07/18/23 1215 07/18/23 1325  BP: 99/73 113/64 122/70 (!) 125/49  Pulse: 94 95 93 (!) 131  Resp: (!) 25 (!) 24 (!) 26   Temp:   98.2 F (36.8 C)   TempSrc:      SpO2: 98% 97% 98%   Weight:      Height:        Intake/Output Summary (Last 24 hours) at 07/18/2023 1447 Last data filed at 07/18/2023 1215 Gross per 24 hour  Intake --  Output 2700 ml  Net -2700 ml      07/18/2023    8:05 AM 07/18/2023    5:00 AM 07/16/2023    3:10 PM  Last 3 Weights  Weight (lbs) 222 lb 14.2 oz 223 lb 12.3 oz 227 lb 8.2 oz  Weight (kg) 101.1 kg 101.5 kg 103.2 kg     Body mass index is 39.48 kg/m.  General:  Chronically ill appearing HEENT: normal Neck: JVD difficult to assess secondary to body habitus  Vascular: No carotid bruits; Distal pulses 2+ bilaterally Cardiac:  normal S1, S2; irregularly irregular and rapid Lungs:  bibasilar crackles Abd: soft, nontender, no hepatomegaly  Ext: no edema Musculoskeletal:  No deformities, BUE and BLE strength normal and equal Skin: warm and dry  Neuro: no focal abnormalities noted Psych: delayed speech response  EKG:  The EKG was personally reviewed and demonstrates:  afib with RVR, LBBB pattern Telemetry:  Telemetry was personally reviewed and demonstrates:  patient with NSR most of today until onset of rapid and irregularly irregular rhythm this afternoon.  Ventricular rates up to 160s. No clearly discernable P waves.   Relevant CV Studies:  07/05/23 LHC    Prox Cx to Mid Cx lesion is 40% stenosed.   Mid Cx lesion is 30% stenosed.   Dist LAD lesion is 10% stenosed.   Recommend Aspirin 81mg  daily for moderate CAD.   There is significant coronary calcification involving the LAD and left circumflex vessel.    Nonobstructive CAD with smooth 10% mid LAD narrowings and left circumflex calcification with 40% proximal stenosis and  smooth 30% mid AV groove stenosis.   Normal nondominant RCA.   RECOMMENDATION: Medical therapy for mild nonobstructive CAD.  Guideline directed therapy with reduced LV function in this patient with end-stage renal disease.    04/15/23 TTE  IMPRESSIONS     1. Left ventricular ejection fraction, by estimation, is 35 to 40%. The  left ventricle has moderately decreased function. The left ventricle  demonstrates global hypokinesis. The left ventricular internal cavity size  was mildly dilated. Left ventricular  diastolic parameters are consistent with Grade I diastolic dysfunction  (impaired relaxation).   2. Right ventricular systolic function is normal. The right ventricular  size is normal. There is moderately elevated pulmonary artery systolic  pressure. The estimated right ventricular systolic pressure is 47.3 mmHg.   3. Left atrial size was mildly dilated.   4. The mitral valve is normal in structure. Trivial mitral valve  regurgitation. No evidence of mitral stenosis.   5. The aortic valve is tricuspid. Aortic valve regurgitation is not  visualized. No aortic stenosis is present. Aortic valve mean gradient  measures 7.0 mmHg.   6. The inferior vena cava is dilated in size with <50% respiratory  variability, suggesting right atrial pressure of 15 mmHg.   FINDINGS   Left Ventricle: Left ventricular ejection fraction, by estimation, is 35  to 40%. The left ventricle has moderately decreased function. The  left  ventricle demonstrates global hypokinesis. Definity contrast agent was  given IV to delineate the left  ventricular endocardial borders. The left ventricular internal cavity size  was mildly dilated. There is no left ventricular hypertrophy. Left  ventricular diastolic parameters are consistent with Grade I diastolic  dysfunction (impaired relaxation).   Laboratory Data:  High Sensitivity Troponin:   Recent Labs  Lab 07/11/23 1321 07/11/23 1611 07/12/23 0308 07/13/23 1758 07/14/23 0801  TROPONINIHS 90* 103* 83* 158* 150*     Chemistry Recent Labs  Lab 07/15/23 0345 07/16/23 0242 07/17/23 0331 07/18/23 0558  NA 132* 133* 135 135  K 5.1 5.2* 4.7 4.9  CL 92* 91* 92* 89*  CO2 24 26 27 28   GLUCOSE 175* 152* 83 78  BUN 62* 76* 55* 79*  CREATININE 5.50* 7.04* 5.47* 7.32*  CALCIUM 9.9 9.8 9.9 9.7  MG 2.0 2.1 2.0  --   GFRNONAA 8* 6* 8* 5*  ANIONGAP 16* 16* 16* 18*    Recent Labs  Lab 07/16/23 0242 07/17/23 0331 07/18/23 0558  ALBUMIN 3.5 3.8 3.8   Lipids No results for input(s): "CHOL", "TRIG", "HDL", "LABVLDL", "LDLCALC", "CHOLHDL" in the last 168 hours.  Hematology Recent Labs  Lab 07/16/23 0242 07/17/23 0331 07/18/23 0558  WBC 11.2* 10.5 11.9*  RBC 3.46* 3.32* 3.37*  HGB 10.6* 10.4* 10.2*  HCT 35.8* 35.8* 35.2*  MCV 103.5* 107.8* 104.5*  MCH 30.6 31.3 30.3  MCHC 29.6* 29.1* 29.0*  RDW 14.8 14.9 15.0  PLT 298 248 239   Thyroid  Recent Labs  Lab 07/11/23 1611  TSH 4.064    BNPNo results for input(s): "BNP", "PROBNP" in the last 168 hours.  DDimer No results for input(s): "DDIMER" in the last 168 hours.   Radiology/Studies:  DG CHEST PORT 1 VIEW  Result Date: 07/16/2023 CLINICAL DATA:  Shortness of breath EXAM: PORTABLE CHEST 1 VIEW COMPARISON:  07/12/2023 FINDINGS: Gross cardiomegaly. Diffuse bilateral interstitial pulmonary opacity and retrocardiac atelectasis or consolidation. No acute osseous findings. IMPRESSION: Gross cardiomegaly with  diffuse bilateral interstitial pulmonary opacity and retrocardiac atelectasis or consolidation. Findings are consistent  with edema or infection. No focal airspace opacity. Electronically Signed   By: Jearld Lesch M.D.   On: 07/16/2023 11:27     Assessment and Plan:   Atrial fibrillation with RVR CHA2DS2-VASc Score = 8   Patient with acute afib with RVR onset during/just after dialysis today. RVR associated with symptoms of chest tightness and palpitations. Patient did not receive schedule beta blocker this morning prior to dialysis due to hold parameters in place with frequent hypotension this admission.   On exam today, patient with near resolution of symptoms after receiving IV lopressor. Rates now down into the 120s. I personally reviewed her telemetry and arrhythmia this afternoon is consistent with afib, no discernable P waves. She did also have SVT on 10/8 but today's arrhythmia clearly different. Suspect that her afib will be paroxysmal and would recommend rate control strategy over rhythm (rhythm controlling agent options also severely limited). Utilize Metoprolol as able with low BP, agree with 37.5mg  BID dosing as ordered by primary team. If BP limits beta blocker dosing, Amiodarone would be next best option.  Agree with starting Eliquis 5mg  BID. Patient also on Plavix with hx TIA and PAD, will need to follow hemoglobin closely, significantly increased bleeding risk.   Non-obstructive CAD  07/05/23 LHC with non-obstructive moderate CAD with 40% prox to mid Cx, 30% mid Cx, 10% dis LAD.   Patient with some chest tightness in the setting of dyspnea and now afib with RVR this admission. Overall low suspicion for acute progression of CAD since recent LHC. 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  Patient with  pulmonary crackles on exam today. Suspect volume will improve with rate control.  Continue volume management via dialysis per nephrology GDMT severely limited by ESRD status as well as hypotension. No room for afterload reducing agents at this time.   Per primary team: Metabolic encephalopathy ESRD Hypothyroidism DM type II Abnormal CT   Risk Assessment/Risk Scores:        New York Heart Association (NYHA) Functional Class Unable to determine. Patient largely bed bound.  CHA2DS2-VASc Score = 8   This indicates a 10.8% annual risk of stroke. The patient's score is based upon: CHF History: 1 HTN History: 1 Diabetes History: 1 Stroke History: 2 Vascular Disease History: 1 Age Score: 1 Gender Score: 1         For questions or updates, please contact [facility] Please consult [email or web address] for contact info under    Signed, Perlie Gold, PA-C  07/18/2023 2:47 PM

## 2023-07-18 NOTE — Progress Notes (Signed)
[facility] Progress Note  Subjective: seen on dialysis. Breathing well today.   Vitals:   07/18/23 1138 07/18/23 1200 07/18/23 1215 07/18/23 1325  BP: 99/73 113/64 122/70 (!) 125/49  Pulse: 94 95 93 (!) 131  Resp: (!) 25 (!) 24 (!) 26   Temp:   98.2 F (36.8 C)   TempSrc:      SpO2: 98% 97% 98%   Weight:      Height:        Exam: Gen calm, [place] O2,  No jvd or bruits Chest normal BS, no rales RRR no RG Abd soft ntnd no mass or ascites +bs Ext no LE edema    LUA AVG+bruit     Renal-related home meds: - coreg 6.25 bid - home O2  - pregabalin 25 every day - renvela 2 ac tid        OP HD: [facility] 4h   400/600  99kg  2/2 bath   aVG   Heparin 2000+ 2000 midrun - last HD 10/3 today, post wt 106kg - 3wks ago coming off 3 over, this last week coming off 7kg up - hectorol 3 mcg  - venofer 100mg  three times per week, thru 10/05 - mircera 50 mcg IV q 2 wks, last 10/3, due 10/17    CXR today 10/08- vasc congestion, early IS edema   Assessment/ Plan: Septic shock - cx's were negative. Getting empiric abx for 7 days per pmd.  ESRD - on HD TTS. Had HD Thurs/ Sat last week here, and Tuesday this week. HD today on schedule.  Volume overload - down to 2kg over, which is the closest to dry wt we have been here. Got 2.7 L off today.  Hypotension - home coreg on hold. Cont midodrine 15 tid.  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 No esa needs at this time.  MBD ckd - CCa and phos are in range. Cont IV vdra and renvela as binder.  Chronic resp failure/ COPD - is on 3 L O2 at home HFrEF - last EF 35-40% in July  Limited DNR - do not intubate DM2 - per pmd      [name] MD  [facility] 07/18/2023, 5:18 PM  Recent Labs  Lab 07/17/23 0331 07/18/23 0558  HGB 10.4* 10.2*  ALBUMIN 3.8 3.8  CALCIUM 9.9 9.7  PHOS 7.0* 7.4*  CREATININE 5.47* 7.32*  K 4.7 4.9   No results for input(s): "IRON", "TIBC", "FERRITIN" in the last 168 hours. Inpatient medications:   amoxicillin-clavulanate  1 tablet Per Tube Q24H   apixaban  5 mg Oral BID   arformoterol  15 mcg Nebulization Q12H   budesonide (PULMICORT) nebulizer solution  0.5 mg Nebulization BID   Chlorhexidine Gluconate Cloth  6 each Topical Q0600   Chlorhexidine Gluconate Cloth  6 each Topical Q0600   clopidogrel  75 mg Oral Daily   feeding supplement (NEPRO CARB STEADY)  237 mL Oral BID BM   insulin aspart  0-20 Units Subcutaneous TID WC   insulin aspart  7 Units Subcutaneous TID WC   insulin glargine-yfgn  35 Units Subcutaneous Daily   levothyroxine  175 mcg Oral Q0600   linezolid  600 mg Oral Q12H   melatonin  3 mg Oral QHS   metoprolol tartrate  37.5 mg Oral BID   midodrine  15 mg Oral Q8H   multivitamin  1 tablet Oral QHS   pantoprazole  40 mg Oral Q1200   polyethylene glycol  17 g  Oral Daily   revefenacin  175 mcg Nebulization Daily   rosuvastatin  20 mg Oral Daily   senna-docusate  2 tablet Oral QHS    albumin human     amiodarone 60 mg/hr (07/18/23 1614)   Followed by   amiodarone     acetaminophen, ipratropium-albuterol, metoprolol tartrate, ondansetron (ZOFRAN) IV, mouth rinse, polyethylene glycol, white petrolatum

## 2023-07-18 NOTE — Progress Notes (Signed)
   07/18/23 1215  Vitals  Temp 98.2 F (36.8 C)  Pulse Rate 93  Resp (!) 26  BP 122/70  SpO2 98 %  O2 Device Nasal Cannula  Oxygen Therapy  O2 Flow Rate (L/min) 3 L/min  Patient Activity (if Appropriate) In bed  Pulse Oximetry Type Continuous  Oximetry Probe Site Changed No  Post Treatment  Dialyzer Clearance Lightly streaked  Hemodialysis Intake (mL) 0 mL  Liters Processed 80.8  Fluid Removed (mL) 2700 mL  Tolerated HD Treatment Yes  AVG/AVF Arterial Site Held (minutes) 10 minutes  AVG/AVF Venous Site Held (minutes) 10 minutes   Received patient in bed to unit.  Alert and oriented. To person and place  Informed consent signed and in chart.   TX duration:3.5  Patient tolerated well.  Transported back to the room  Alert, without acute distress.  Hand-off given to patient's nurse.   Access used: LUAG Access issues: no complications  Total UF removed: 2700 Medication(s) given: none   [facility]

## 2023-07-18 NOTE — Progress Notes (Signed)
OT Cancellation Note  Patient Details Name: [name] [name] MRN: [id number] DOB: 1951/06/18   Cancelled Treatment:    Reason Eval/Treat Not Completed: Medical issues which prohibited therapy (discussed with RN that pt is in RVR with HR in the 160s at rest. Deferring therapy at this time, OT to follow-up with pt as able.)   07/18/2023  AB, OTR/L  Acute Rehabilitation Services  Office: [phone]  Tristan Schroeder 07/18/2023, 3:16 PM

## 2023-07-18 NOTE — Discharge Instructions (Signed)

## 2023-07-18 NOTE — TOC Benefit Eligibility Note (Signed)
Patient Product/process development scientist completed.    The patient is insured through [facility]. Patient has Medicare and is not eligible for a copay card, but may be able to apply for patient assistance, if available.    Ran test claim for Eliquis 5 mg and the current 30 day co-pay is $0.00.   This test claim was processed through [facility]- copay amounts may vary at other pharmacies due to pharmacy/plan contracts, or as the patient moves through the different stages of their insurance plan.     Roland Earl, CPHT Pharmacy Technician III Certified Patient Advocate [facility] Pharmacy Patient Advocate Team Direct Number: [phone]  Fax: [phone]

## 2023-07-18 NOTE — Plan of Care (Signed)
Problem: Education: Goal: Understanding of CV disease, CV risk reduction, and recovery process will improve Outcome: Progressing Goal: Individualized Educational Video(s) Outcome: Progressing   Problem: Activity: Goal: Ability to return to baseline activity level will improve Outcome: Progressing   Problem: Cardiovascular: Goal: Ability to achieve and maintain adequate cardiovascular perfusion will improve Outcome: Progressing Goal: Vascular access site(s) Level 0-1 will be maintained Outcome: Progressing   Problem: Health Behavior/Discharge Planning: Goal: Ability to safely manage health-related needs after discharge will improve Outcome: Progressing   Problem: Education: Goal: Knowledge of General Education information will improve Description: Including pain rating scale, medication(s)/side effects and non-pharmacologic comfort measures Outcome: Progressing   Problem: Health Behavior/Discharge Planning: Goal: Ability to manage health-related needs will improve Outcome: Progressing   Problem: Clinical Measurements: Goal: Ability to maintain clinical measurements within normal limits will improve Outcome: Progressing Goal: Will remain free from infection Outcome: Progressing Goal: Diagnostic test results will improve Outcome: Progressing Goal: Respiratory complications will improve Outcome: Progressing Goal: Cardiovascular complication will be avoided Outcome: Progressing   Problem: Activity: Goal: Risk for activity intolerance will decrease Outcome: Progressing   Problem: Nutrition: Goal: Adequate nutrition will be maintained Outcome: Progressing   Problem: Coping: Goal: Level of anxiety will decrease Outcome: Progressing   Problem: Elimination: Goal: Will not experience complications related to bowel motility Outcome: Progressing Goal: Will not experience complications related to urinary retention Outcome: Progressing   Problem: Pain Managment: Goal:  General experience of comfort will improve Outcome: Progressing   Problem: Safety: Goal: Ability to remain free from injury will improve Outcome: Progressing   Problem: Skin Integrity: Goal: Risk for impaired skin integrity will decrease Outcome: Progressing   Problem: Education: Goal: Ability to describe self-care measures that may prevent or decrease complications (Diabetes Survival Skills Education) will improve Outcome: Progressing Goal: Individualized Educational Video(s) Outcome: Progressing   Problem: Coping: Goal: Ability to adjust to condition or change in health will improve Outcome: Progressing   Problem: Fluid Volume: Goal: Ability to maintain a balanced intake and output will improve Outcome: Progressing   Problem: Health Behavior/Discharge Planning: Goal: Ability to identify and utilize available resources and services will improve Outcome: Progressing Goal: Ability to manage health-related needs will improve Outcome: Progressing   Problem: Metabolic: Goal: Ability to maintain appropriate glucose levels will improve Outcome: Progressing   Problem: Nutritional: Goal: Maintenance of adequate nutrition will improve Outcome: Progressing Goal: Progress toward achieving an optimal weight will improve Outcome: Progressing   Problem: Skin Integrity: Goal: Risk for impaired skin integrity will decrease Outcome: Progressing   Problem: Tissue Perfusion: Goal: Adequacy of tissue perfusion will improve Outcome: Progressing   Problem: Education: Goal: Understanding of CV disease, CV risk reduction, and recovery process will improve Outcome: Progressing   Problem: Education: Goal: Individualized Educational Video(s) Outcome: Progressing   Problem: Activity: Goal: Ability to return to baseline activity level will improve Outcome: Progressing   Problem: Cardiovascular: Goal: Ability to achieve and maintain adequate cardiovascular perfusion will  improve Outcome: Progressing   Problem: Cardiovascular: Goal: Vascular access site(s) Level 0-1 will be maintained Outcome: Progressing   Problem: Cardiovascular: Goal: Vascular access site(s) Level 0-1 will be maintained Outcome: Progressing   Problem: Health Behavior/Discharge Planning: Goal: Ability to safely manage health-related needs after discharge will improve Outcome: Progressing   Problem: Education: Goal: Knowledge of General Education information will improve Description: Including pain rating scale, medication(s)/side effects and non-pharmacologic comfort measures Outcome: Progressing   Problem: Clinical Measurements: Goal: Ability to maintain clinical measurements within normal limits will  improve Outcome: Progressing   Problem: Health Behavior/Discharge Planning: Goal: Ability to manage health-related needs will improve Outcome: Progressing   Problem: Clinical Measurements: Goal: Diagnostic test results will improve Outcome: Progressing   Problem: Clinical Measurements: Goal: Respiratory complications will improve Outcome: Progressing   Problem: Clinical Measurements: Goal: Cardiovascular complication will be avoided Outcome: Progressing   Problem: Clinical Measurements: Goal: Cardiovascular complication will be avoided Outcome: Progressing   Problem: Pain Managment: Goal: General experience of comfort will improve Outcome: Progressing   Problem: Elimination: Goal: Will not experience complications related to urinary retention Outcome: Progressing   Problem: Elimination: Goal: Will not experience complications related to urinary retention Outcome: Progressing   Problem: Elimination: Goal: Will not experience complications related to bowel motility Outcome: Progressing   Problem: Coping: Goal: Level of anxiety will decrease Outcome: Progressing   Problem: Coping: Goal: Level of anxiety will decrease Outcome: Progressing   Problem:  Fluid Volume: Goal: Ability to maintain a balanced intake and output will improve Outcome: Progressing   Problem: Coping: Goal: Ability to adjust to condition or change in health will improve Outcome: Progressing   Problem: Coping: Goal: Ability to adjust to condition or change in health will improve Outcome: Progressing   Problem: Education: Goal: Individualized Educational Video(s) Outcome: Progressing   Problem: Education: Goal: Individualized Educational Video(s) Outcome: Progressing   Problem: Nutritional: Goal: Progress toward achieving an optimal weight will improve Outcome: Progressing   Problem: Nutritional: Goal: Progress toward achieving an optimal weight will improve Outcome: Progressing   Problem: Skin Integrity: Goal: Risk for impaired skin integrity will decrease Outcome: Progressing   Problem: Tissue Perfusion: Goal: Adequacy of tissue perfusion will improve Outcome: Progressing   Problem: Tissue Perfusion: Goal: Adequacy of tissue perfusion will improve Outcome: Progressing   Problem: Nutritional: Goal: Progress toward achieving an optimal weight will improve Outcome: Progressing   Problem: Nutritional: Goal: Progress toward achieving an optimal weight will improve Outcome: Progressing

## 2023-07-19 DIAGNOSIS — I5022 Chronic systolic (congestive) heart failure: Secondary | ICD-10-CM

## 2023-07-19 DIAGNOSIS — J9602 Acute respiratory failure with hypercapnia: Secondary | ICD-10-CM | POA: Diagnosis not present

## 2023-07-19 DIAGNOSIS — I4819 Other persistent atrial fibrillation: Secondary | ICD-10-CM

## 2023-07-19 DIAGNOSIS — A419 Sepsis, unspecified organism: Secondary | ICD-10-CM | POA: Diagnosis not present

## 2023-07-19 DIAGNOSIS — G9341 Metabolic encephalopathy: Secondary | ICD-10-CM | POA: Diagnosis not present

## 2023-07-19 DIAGNOSIS — J9621 Acute and chronic respiratory failure with hypoxia: Secondary | ICD-10-CM | POA: Diagnosis not present

## 2023-07-19 LAB — GLUCOSE, CAPILLARY
Glucose-Capillary: 214 mg/dL — ABNORMAL HIGH (ref 70–99)
Glucose-Capillary: 233 mg/dL — ABNORMAL HIGH (ref 70–99)
Glucose-Capillary: 210 mg/dL — ABNORMAL HIGH (ref 70–99)
Glucose-Capillary: 110 mg/dL — ABNORMAL HIGH (ref 70–99)

## 2023-07-19 MED ORDER — AMIODARONE HCL 200 MG PO TABS
400.0000 mg | ORAL_TABLET | Freq: Two times a day (BID) | ORAL | Status: DC
  Administered 2023-07-19 (×2): 400 mg via ORAL
  Filled 2023-07-19 (×2): qty 2

## 2023-07-19 MED ORDER — DOXERCALCIFEROL 4 MCG/2ML IV SOLN
3.0000 ug | INTRAVENOUS | Status: DC
  Administered 2023-07-20: 3 ug via INTRAVENOUS
  Filled 2023-07-19 (×2): qty 2

## 2023-07-19 MED ORDER — CHLORHEXIDINE GLUCONATE CLOTH 2 % EX PADS: 6.0000 | MEDICATED_PAD | Freq: Every day | CUTANEOUS | Status: DC

## 2023-07-19 MED ORDER — SEVELAMER CARBONATE 800 MG PO TABS
1600.0000 mg | ORAL_TABLET | Freq: Three times a day (TID) | ORAL | Status: DC
  Administered 2023-07-19: 1600 mg via ORAL
  Filled 2023-07-19: qty 2

## 2023-07-19 NOTE — Progress Notes (Signed)
Mobility Specialist Progress Note;   07/19/23 1215  Mobility  Activity Transferred from bed to chair  Level of Assistance Total care (+2)  Assistive Device MaxiMove  Activity Response Tolerated well  Mobility Referral Yes  $Mobility charge 1 Mobility  Mobility Specialist Start Time (ACUTE ONLY) 1215  Mobility Specialist Stop Time (ACUTE ONLY) 1230  Mobility Specialist Time Calculation (min) (ACUTE ONLY) 15 min   Pt agreeable to mobility. Transferred B>C via MaxiMove. Asx throughout session. Pt left in chair with all needs met. Visitor in room.   Caesar Bookman Mobility Specialist Please contact via SecureChat or Rehab Office [phone]

## 2023-07-19 NOTE — Progress Notes (Signed)
PROGRESS NOTE        PATIENT DETAILS Name: [name] [name] Age: 72 y.o. Sex: female Date of Birth: 09/01/1951 Admit Date: 07/11/2023 Admitting Physician Josephine Igo, DO NFA:OZHY, [name], FNP  Brief Summary: Patient is a 72 y.o.  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  She was stabilized and transferred to [facility] on 10/9.    Significant events: 10/3>> admit to PCCM 10/9>> transferred to [facility] 10/10>> A-fib RVR-cardiology consulted-amiodarone started-subsequently converted back to sinus rhythm.  Significant studies: 10/3>> CT head: No acute intracranial abnormality 10/3>> CT chest: Progressive enlargement of the soft tissue in prevascular space-suspicious for metastatic disease.  Unchanged left greater than right pleural effusion.  New groundglass opacities left upper lobe.  Significant microbiology data: 10/4>> blood culture: No growth  Procedures: None  Consults: PCCM Nephrology  Subjective: Lying probably in bed-no major issues  Objective: Vitals: Blood pressure 115/64, pulse 85, temperature 97.8 F (36.6 C), temperature source Oral, resp. rate (!) 21, height 5\' 3"  (1.6 m), weight 99.6 kg, SpO2 99%.   Exam: Gen Exam:Alert awake-not in any distress HEENT:atraumatic, normocephalic Chest: B/L clear to auscultation anteriorly CVS:S1S2 regular Abdomen:soft non tender, non distended Extremities:no edema Neurology: Non focal-but some generalized weakness  at baseline. Skin: no rash  Pertinent Labs/Radiology:    Latest Ref Rng & Units 07/18/2023    5:58 AM 07/17/2023    3:31 AM 07/16/2023    2:42 AM  CBC  WBC 4.0 - 10.5 K/uL 11.9  10.5  11.2   Hemoglobin 12.0 - 15.0 g/dL 86.5  78.4  69.6   Hematocrit 36.0 - 46.0 % 35.2  35.8  35.8   Platelets 150 - 400 K/uL 239  248  298     Lab Results  Component Value Date   NA 135 07/18/2023   K 4.9 07/18/2023   CL 89 (L) 07/18/2023   CO2 28 07/18/2023    Assessment/Plan: Acute on chronic hypoxemic and hypercarbic respiratory failure due to a combination of volume overload and COPD exacerbation (on 3 L of oxygen at baseline) Improved with bronchodilators/HD sessions Down to 3 L of oxygen Continue to treat underlying etiologies-continue pulmonary toileting.  Acute metabolic encephalopathy Secondary to hypercarbia/hypoxemia Improved with BiPAP Refuses to use BiPAP here in 5 W.-claims intolerant.  Understands life-threatening/life disabling consequences.  Culture negative septic shock Sepsis physiology has resolved All cultures negative Per PCCM note on 10/8-plan is to continue 7 days of empiric Augmentin/Zyvox.  SVT Occurred while in the ICU Maintaining sinus rhythm this morning Continue metoprolol.  PAF with RVR RVR on 10/10-evaluated by cardiology-started on amiodarone-subsequently converted to sinus rhythm Remains on beta-blockers Now on oral amiodarone Eliquis started given CHA2DS2-VASc of at least 6. Remains on telemetry-cardiology following.  Acute on chronic HFrEF (EF 35-40% by TTE on 04/15/2023) Euvolemic on exam this morning Volume removal with hemodialysis. On midodrine-with BP soft but stable.  Nonobstructive CAD on recent LHC on 9/27 No chest pain Plavix  discontinued-as the patient now on Eliquis Continue beta-blocker/statin  History of PAD Statin See above regarding Plavix.  ESRD on HD TTS Nephrology following  Normocytic anemia Secondary to combination of  ESRD/critical illness Aranesp/iron deferred to nephrology service Follow CBC  Hypothyroidism Synthroid TSH 10/3 stable  DM-2 (A1c 7.1 on 7/6) CBGs stable-Semglee 35 units daily, 7 units of NovoLog with meals and SSI   Recent Labs    07/18/23 1605 07/18/23 2142 07/19/23 0904  GLUCAP 172* 216* 214*     History of lung cancer-s/p radiation Will need outpatient follow-up with oncology and PET scan.  Some concern for metastatic disease on CT imaging done on admission.  Osteoarthritis of bilateral knees Stable at present-outpatient orthopedic evaluation  Constipation Had BM overnight Continue MiraLAX/senna  Debility/deconditioning Per patient-she has not ambulated in close to a month-previously minimally ambulatory and mostly bed to wheelchair bound PT/OT eval-Home health recommended.  Nutrition Status: Nutrition Problem: Increased nutrient needs Etiology: chronic illness (ESRD on HD) Signs/Symptoms: estimated needs Interventions: Refer to RD note for recommendations   Morbid Obesity: Estimated body mass index is 38.9 kg/m as calculated from the following:   Height as of this encounter: 5\' 3"  (1.6 m).   Weight as of this encounter: 99.6 kg.   Code status:   Code Status: Limited: Do not attempt resuscitation (DNR) -DNR-LIMITED -Do Not Intubate/DNI    DVT Prophylaxis:  apixaban (ELIQUIS) tablet 5 mg    Family Communication: [name] [phone] -updated by phone 10/10   Disposition Plan: Status is: Inpatient Remains inpatient appropriate because: Severity of illness   Planned Discharge Destination:Home health    Diet: Diet Order             Diet renal/carb modified with fluid restriction Diet-HS Snack? Nothing; Fluid restriction: 1200 mL Fluid; Room service appropriate? Yes; Fluid consistency: Thin  Diet effective now                     Antimicrobial agents: Anti-infectives (From admission, onward)    Start     Dose/Rate Route Frequency  Ordered Stop   07/14/23 1000  linezolid (ZYVOX) tablet 600 mg        600 mg Oral Every 12 hours 07/14/23 0838 07/18/23 2359   07/13/23 2200  linezolid (ZYVOX) tablet 600 mg  Status:  Discontinued        600 mg Per Tube Every 12 hours 07/13/23 1910 07/14/23 0838   07/13/23 2000  amoxicillin-clavulanate (AUGMENTIN) 500-125 MG per tablet 1 tablet        1 tablet Per Tube Every 24 hours 07/13/23 1910 07/18/23 2132   07/13/23 1700  vancomycin (VANCOCIN) IVPB 1000 mg/200 mL premix  Status:  Discontinued        1,000 mg 200 mL/hr over 60 Minutes Intravenous Every Sat (Hemodialysis) 07/13/23 1118 07/13/23 1910   07/12/23 1252  vancomycin variable dose per unstable renal function (pharmacist dosing)  Status:  Discontinued         Does not apply See admin instructions 07/12/23 1253 07/13/23 1910   07/12/23 1015  piperacillin-tazobactam (ZOSYN) IVPB 2.25 g  Status:  Discontinued        2.25 g 100 mL/hr over 30 Minutes Intravenous Every 8 hours 07/12/23 0922 07/13/23 1910   07/12/23 1015  vancomycin (VANCOREADY) IVPB 2000 mg/400 mL        2,000 mg 200 mL/hr over 120 Minutes Intravenous  Once 07/12/23 0981 07/12/23 1207        MEDICATIONS: Scheduled Meds:  amiodarone  400 mg Oral BID   apixaban  5 mg Oral BID   arformoterol  15 mcg Nebulization Q12H   budesonide (PULMICORT) nebulizer solution  0.5 mg Nebulization BID   Chlorhexidine Gluconate Cloth  6 each Topical Q0600   Chlorhexidine Gluconate Cloth  6 each Topical Q0600   clopidogrel  75 mg Oral Daily   feeding supplement (NEPRO CARB STEADY)  237 mL Oral BID BM   insulin aspart  0-20 Units Subcutaneous TID WC   insulin aspart  7 Units Subcutaneous TID WC   insulin glargine-yfgn  35 Units Subcutaneous Daily   levothyroxine  175 mcg Oral Q0600   melatonin  3 mg Oral QHS   metoprolol tartrate  37.5 mg Oral BID   midodrine  15 mg Oral Q8H   multivitamin  1 tablet Oral QHS   pantoprazole  40 mg Oral Q1200   polyethylene glycol  17 g Oral  Daily   revefenacin  175 mcg Nebulization Daily   rosuvastatin  20 mg Oral Daily   senna-docusate  2 tablet Oral QHS   Continuous Infusions:  albumin human     PRN Meds:.acetaminophen, ipratropium-albuterol, metoprolol tartrate, ondansetron (ZOFRAN) IV, mouth rinse, polyethylene glycol, white petrolatum   I have personally reviewed following labs and imaging studies  LABORATORY DATA: CBC: Recent Labs  Lab 07/14/23 0244 07/15/23 0345 07/16/23 0242 07/17/23 0331 07/18/23 0558  WBC 12.8* 12.2* 11.2* 10.5 11.9*  HGB 10.3* 10.8* 10.6* 10.4* 10.2*  HCT 34.2* 34.9* 35.8* 35.8* 35.2*  MCV 103.6* 102.6* 103.5* 107.8* 104.5*  PLT 269 240 298 248 239    Basic Metabolic Panel: Recent Labs  Lab 07/12/23 1749 07/13/23 0753 07/13/23 1505 07/14/23 0244 07/15/23 0345 07/16/23 0242 07/17/23 0331 07/18/23 0558  NA  --   --    < > 132* 132* 133* 135 135  K  --   --    < > 3.6 5.1 5.2* 4.7 4.9  CL  --   --    < > 90* 92* 91* 92* 89*  CO2  --   --    < > 30 24 26 27 28   GLUCOSE  --   --    < > 283* 175* 152* 83 78  BUN  --   --    < > 40* 62* 76* 55* 79*  CREATININE  --   --    < > 4.12* 5.50* 7.04* 5.47* 7.32*  CALCIUM  --   --    < > 9.3 9.9 9.8 9.9 9.7  MG 2.1 2.1  --   --  2.0 2.1 2.0  --   PHOS 5.6* 5.4*   < > 3.0 5.2* 7.2* 7.0* 7.4*   < > = values in this interval not displayed.    GFR: Estimated Creatinine Clearance: 7.8 mL/min (A) (by C-G formula based on SCr of 7.32 mg/dL (H)).  Liver Function Tests: Recent Labs  Lab 07/13/23 1505 07/14/23 0244 07/16/23 0242 07/17/23 0331 07/18/23 0558  ALBUMIN 2.9* 3.0* 3.5 3.8 3.8   No results for input(s): "LIPASE", "AMYLASE" in the last 168 hours. No results for input(s): "AMMONIA" in the last 168 hours.  Coagulation Profile: No results for input(s): "INR", "PROTIME" in the last 168 hours.  Cardiac Enzymes: No results for input(s): "CKTOTAL", "CKMB", "CKMBINDEX", "TROPONINI" in the last 168 hours.  BNP (last 3  results) No results for input(s): "PROBNP" in the last 8760 hours.  Lipid Profile: No results for input(s): "CHOL", "HDL", "  LDLCALC", "TRIG", "CHOLHDL", "LDLDIRECT" in the last 72 hours.  Thyroid Function Tests: No results for input(s): "TSH", "T4TOTAL", "FREET4", "T3FREE", "THYROIDAB" in the last 72 hours.  Anemia Panel: No results for input(s): "VITAMINB12", "FOLATE", "FERRITIN", "TIBC", "IRON", "RETICCTPCT" in the last 72 hours.  Urine analysis:    Component Value Date/Time   COLORURINE YELLOW 11/27/2021 0956   APPEARANCEUR HAZY (A) 11/27/2021 0956   LABSPEC 1.012 11/27/2021 0956   PHURINE 5.0 11/27/2021 0956   GLUCOSEU NEGATIVE 11/27/2021 0956   HGBUR NEGATIVE 11/27/2021 0956   BILIRUBINUR NEGATIVE 11/27/2021 0956   KETONESUR NEGATIVE 11/27/2021 0956   PROTEINUR 30 (A) 11/27/2021 0956   UROBILINOGEN 0.2 08/04/2014 1409   NITRITE NEGATIVE 11/27/2021 0956   LEUKOCYTESUR NEGATIVE 11/27/2021 0956    Sepsis Labs: Lactic Acid, Venous    Component Value Date/Time   LATICACIDVEN 0.9 07/11/2023 1334    MICROBIOLOGY: Recent Results (from the past 240 hour(s))  MRSA Next Gen by PCR, Nasal     Status: None   Collection Time: 07/11/23  5:32 PM   Specimen: Nasal Mucosa; Nasal Swab  Result Value Ref Range Status   MRSA by PCR Next Gen NOT DETECTED NOT DETECTED Final    Comment: (NOTE) The GeneXpert MRSA Assay (FDA approved for NASAL specimens only), is one component of a comprehensive MRSA colonization surveillance program. It is not intended to diagnose MRSA infection nor to guide or monitor treatment for MRSA infections. Test performance is not FDA approved in patients less than 51 years old. Performed at [facility], [address] [place], [place] [address]   Culture, blood (Routine X 2) w Reflex to ID Panel     Status: None   Collection Time: 07/12/23  8:38 AM   Specimen: BLOOD  Result Value Ref Range Status   Specimen Description BLOOD BLOOD RIGHT HAND   Final   Special Requests   Final    AEROBIC BOTTLE ONLY Blood Culture results may not be optimal due to an inadequate volume of blood received in culture bottles   Culture   Final    NO GROWTH 5 DAYS Performed at [facility], [address] [place], [place] [address]    Report Status 07/17/2023 FINAL  Final  Culture, blood (Routine X 2) w Reflex to ID Panel     Status: None   Collection Time: 07/12/23  8:41 AM   Specimen: BLOOD  Result Value Ref Range Status   Specimen Description BLOOD BLOOD RIGHT HAND  Final   Special Requests   Final    AEROBIC BOTTLE ONLY Blood Culture results may not be optimal due to an inadequate volume of blood received in culture bottles   Culture   Final    NO GROWTH 5 DAYS Performed at [facility], [address] [place], [place] [address]    Report Status 07/17/2023 FINAL  Final    RADIOLOGY STUDIES/RESULTS: No results found.   LOS: 8 days   Jeoffrey Massed, MD  [facility]    To contact the attending provider between 7A-7P or the covering provider during after hours 7P-7A, please log into the web site [email or web address] and access using universal [place] password for that web site. If you do not have the password, please call the hospital operator.  07/19/2023, 10:41 AM

## 2023-07-19 NOTE — Progress Notes (Signed)
Occupational Therapy Treatment Patient Details Name: [name] [name] MRN: [id number] DOB: [birth year] Today's Date: 07/19/2023   History of present illness 72 yo female admitted 10/3 with AMS after HD, pt hypotensive requiring pressor support, shock. PMhx: CHF, obesity, Lung CA, TIA, HTN, HLD, GERD, DM, COPD on 3LNC, obesity, ESRD on HD TThS, gout, PVD   OT comments  Focused session on UB strengthening exercises to help pt progress in providing assist with bed mobility. Educated pt on exercises, needed Max cues for form and new learning, her R shoulder was limited but able to perform shoulder flexion with AAROM. Handout provided to reinforce carryover, pt did seem to be more "spaced out" today. She had a flat affect and had trouble following along with exercises despite visual demonstration and physical adjustments. She would be shown the next exercises, have her form adjusted, then return back to doing elbow extensions and ask "am I doing it right" on several occasions. OT to continue to progress pt as able, DC plans appropriate for SNF.       If plan is discharge home, recommend the following:  Two people to help with walking and/or transfers;Two people to help with bathing/dressing/bathroom;Assistance with cooking/housework   Equipment Recommendations  None recommended by OT (family has rec DME)    Recommendations for Other Services      Precautions / Restrictions Precautions Precautions: Fall Precaution Comments: watch sats and bp Restrictions Weight Bearing Restrictions: No       Mobility Bed Mobility       General bed mobility comments: deferred; focused on exercises           ADL either performed or assessed with clinical judgement   ADL         General ADL Comments: Focused session on UB strengthening exercises      Cognition Arousal: Alert Behavior During Therapy: Flat affect Overall Cognitive Status: Impaired/Different from baseline Area of Impairment:  Following commands, Problem solving                       Following Commands: Follows one step commands with increased time     Problem Solving: Slow processing General Comments: Pt needing increased time and cues during exercise program, slow to respond to therapist and son on what she would like to eat.        Exercises General Exercises - Upper Extremity Shoulder Flexion: AROM, Both, Supine, 10 reps, Theraband (AAROM needed for RUE) Theraband Level (Shoulder Flexion): Level 3 (Green) Shoulder Horizontal ABduction: AROM, Left, Supine, 10 reps, Theraband Theraband Level (Shoulder Horizontal Abduction): Level 3 (Green) Elbow Flexion: AROM, Both, Supine, 10 reps, Theraband Theraband Level (Elbow Flexion): Level 3 (Green) Elbow Extension: AROM, 20 reps, Both, Theraband, Supine Theraband Level (Elbow Extension): Level 3 (Green) Other Exercises Other Exercises: Elbow presses into bed x5 reps RUE       General Comments VSS on RA, HR in 80s with activity    Pertinent Vitals/ Pain       Pain Assessment Pain Assessment: No/denies pain   Frequency  Min 1X/week        Progress Toward Goals  OT Goals(current goals can now be found in the care plan section)  Progress towards OT goals: Progressing toward goals  Acute Rehab OT Goals Patient Stated Goal: would like to be able to stand again OT Goal Formulation: With patient Time For Goal Achievement: 07/28/23 Potential to Achieve Goals: Good ADL Goals Pt/caregiver will Perform Home Exercise Program:  Increased strength;With Supervision;With theraband;With written HEP provided;Both right and left upper extremity  Plan         AM-PAC OT "6 Clicks" Daily Activity     Outcome Measure   Help from another person eating meals?: None Help from another person taking care of personal grooming?: A Little Help from another person toileting, which includes using toliet, bedpan, or urinal?: Total Help from another person  bathing (including washing, rinsing, drying)?: A Lot Help from another person to put on and taking off regular upper body clothing?: A Little Help from another person to put on and taking off regular lower body clothing?: Total 6 Click Score: 14    End of Session    OT Visit Diagnosis: Unsteadiness on feet (R26.81);Muscle weakness (generalized) (M62.81);Other abnormalities of gait and mobility (R26.89)   Activity Tolerance Patient tolerated treatment well   Patient Left in bed;with call bell/phone within reach;with bed alarm set;with family/visitor present   Nurse Communication Mobility status        Time: 1610-9604 OT Time Calculation (min): 21 min  Charges: OT General Charges $OT Visit: 1 Visit OT Treatments $Therapeutic Exercise: 8-22 mins  07/19/2023  AB, OTR/L  Acute Rehabilitation Services  Office: [phone]   Tristan Schroeder 07/19/2023, 5:05 PM

## 2023-07-19 NOTE — Care Management Important Message (Signed)
Important Message  Patient Details  Name: [name] [name] MRN: [id number] Date of Birth: 08-Jul-1951   Important Message Given:  Yes - Medicare IM IM given on 07/18/2023    Bemnet Trovato 07/19/2023, 10:25 AM

## 2023-07-19 NOTE — Progress Notes (Signed)
[facility] Progress Note  Subjective: seen in room. No c/o's today. Pt maintaining NSR today.   Vitals:   07/19/23 0754 07/19/23 0800 07/19/23 1233 07/19/23 1234  BP:  115/64 98/75   Pulse: 86 85 83 84  Resp: 18 (!) 21 20 (!) 22  Temp:  97.8 F (36.6 C) 98.8 F (37.1 C)   TempSrc:  Oral Oral   SpO2: 99%  96% 96%  Weight:      Height:        Exam: Gen calm, [place] O2,  No jvd or bruits Chest normal BS, no rales RRR no RG Abd soft ntnd no mass or ascites +bs Ext no LE edema    LUA AVG+bruit     Renal-related home meds: - coreg 6.25 bid - home O2  - pregabalin 25 every day - renvela 2 ac tid        OP HD: [facility] TTS 4h   400/600  99kg  2/2 bath   aVG   Heparin 2000+ 2000 midrun - last HD 10/3 today, post wt 106kg - 3wks ago coming off 3 over, this last week coming off 7kg up - hectorol 3 mcg  - venofer 100mg  three times per week, thru 10/05 - mircera 50 mcg IV q 2 wks, last 10/3, due 10/17    CXR today 10/08- vasc congestion, early IS edema   Assessment/ Plan: Septic shock - cx's were negative. S/P course of abx x 7 days.  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 Stable.  ESRD - on HD TTS. Had HD here yesterday, next HD Saturday.  Volume overload - close to dry wt at 99kg. Probably has lost body wt. UF goal 2.5 L w/ HD tomorrow.  Hypotension - getting metoprolol bid here. Started midodrine here, currently at 15 tid.  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 No esa needs at this time.  MBD ckd - CCa in range, phos a bit high. Resuming IV vdra and renvela as binder.  Chronic resp failure/ COPD - is on 3 L O2 at home HFrEF - last EF 35-40% in July  Limited DNR - do not intubate DM2 - per pmd      [name] Moselle MD  [facility] 07/19/2023, 2:34 PM  Recent Labs  Lab 07/17/23 0331 07/18/23 0558  HGB 10.4* 10.2*  ALBUMIN 3.8 3.8  CALCIUM 9.9 9.7  PHOS 7.0* 7.4*  CREATININE 5.47*  7.32*  K 4.7 4.9   No results for input(s): "IRON", "TIBC", "FERRITIN" in the last 168 hours. Inpatient medications:  amiodarone  400 mg Oral BID   apixaban  5 mg Oral BID   arformoterol  15 mcg Nebulization Q12H   budesonide (PULMICORT) nebulizer solution  0.5 mg Nebulization BID   Chlorhexidine Gluconate Cloth  6 each Topical Q0600   Chlorhexidine Gluconate Cloth  6 each Topical Q0600   clopidogrel  75 mg Oral Daily   [START ON 07/20/2023] doxercalciferol  3 mcg Intravenous Q T,Th,Sa-HD   feeding supplement (NEPRO CARB STEADY)  237 mL Oral BID BM   insulin aspart  0-20 Units Subcutaneous TID WC   insulin aspart  7 Units Subcutaneous TID WC   insulin glargine-yfgn  35 Units Subcutaneous Daily   levothyroxine  175 mcg Oral Q0600   melatonin  3 mg Oral QHS   metoprolol tartrate  37.5 mg Oral BID   midodrine  15 mg Oral  Q8H   multivitamin  1 tablet Oral QHS   pantoprazole  40 mg Oral Q1200   polyethylene glycol  17 g Oral Daily   revefenacin  175 mcg Nebulization Daily   rosuvastatin  20 mg Oral Daily   senna-docusate  2 tablet Oral QHS   sevelamer carbonate  1,600 mg Oral TID WC    albumin human     acetaminophen, ipratropium-albuterol, metoprolol tartrate, ondansetron (ZOFRAN) IV, mouth rinse, polyethylene glycol, white petrolatum

## 2023-07-19 NOTE — Progress Notes (Signed)
Mobility Specialist Progress Note;   07/19/23 1440  Mobility  Activity Transferred from chair to bed  Level of Assistance Total care (+2)  Assistive Device MaxiMove  Activity Response Tolerated well  Mobility Referral Yes  $Mobility charge 1 Mobility  Mobility Specialist Start Time (ACUTE ONLY) 1440  Mobility Specialist Stop Time (ACUTE ONLY) 1300  Mobility Specialist Time Calculation (min) (ACUTE ONLY) 1340 min   Pt agreeable to mobility. Requested assistance to transfer C>B vis MaxiMove. Asx throughout transfer. Pt back in bed with all needs met. Son in room.  Caesar Bookman Mobility Specialist Please contact via SecureChat or Rehab Office [phone]

## 2023-07-19 NOTE — Progress Notes (Signed)
   Patient Name: [name] [name] Date of Encounter: 07/19/2023 [facility] Cardiologist: Nanetta Batty, MD   Interval Summary  .    Patient maintaining NSR today. Denies palpitations, shortness of breath. Reports mild chest pain   Vital Signs .    Vitals:   07/19/23 0030 07/19/23 0444 07/19/23 0500 07/19/23 0754  BP: 102/63 (!) 107/56 (!) 107/56   Pulse:  80  86  Resp:  (!) 26  18  Temp: 97.9 F (36.6 C) 98 F (36.7 C) 98 F (36.7 C)   TempSrc: Oral Oral    SpO2:  98%  99%  Weight:   99.6 kg   Height:        Intake/Output Summary (Last 24 hours) at 07/19/2023 0810 Last data filed at 07/18/2023 1215 Gross per 24 hour  Intake --  Output 2700 ml  Net -2700 ml      07/19/2023    5:00 AM 07/18/2023    8:05 AM 07/18/2023    5:00 AM  Last 3 Weights  Weight (lbs) 219 lb 9.3 oz 222 lb 14.2 oz 223 lb 12.3 oz  Weight (kg) 99.6 kg 101.1 kg 101.5 kg      Telemetry/ECG    Patient converted to NSR yesterday afternoon around 1630. Has been maintaining NSR since with HR in the 80s - Personally Reviewed  Physical Exam .   GEN: No acute distress. Laying in the bed with head elevated  Neck: No JVD Cardiac: RRR, no murmurs, rubs, or gallops.  Respiratory: Breathing unlabored  GI: Soft, nontender, non-distended  MS: No edema in BLE   Assessment & Plan .     Atrial Fibrillation with RVR  - Patient developed afib with RVR during/after HD yesterday. When in RVR, patient reported chest tightness and palpitations  - Was started on IV amiodarone and converted back to NSR yesterday afternoon. Has been maintaining NSR since  - Transition from IV amiodarone to PO amiodarone 400 mg BID for 7 days followed by 400 mg daily for 7 days, then 200 mg daily  - Continue metoprolol tartrate 37.5 mg BID  - Continue eliquis 5 mg BID for CHADS-VASc 8   Non-obstructive CAD  - Left heart catheterization from 06/2023 showed significant coronary calcification involving the LAD and left  circumflex vessel, nonobstructive CAD with smooth 10% mid LAD narrowings and left circumflex calcification with 40% proximal stenosis and smooth 30% mid AV groove stenosis  - When in afib with RVR, patient reported chest tightness. This AM she reports some vague, mild chest pain. Pain is worse with palpation. With recent catheterization showing mild nonobstructive CAD and pain being worse with palpation, I do not not think pain is cardiac  - Continue crestor, metoprolol. Plavix stopped with eliquis start   Chronic combined systolic and diastolic heart failure  Non-ischemic cardiomyopathy  - Echocardiogram from 04/15/23 showed EF 35-40%, grade I DD, normal RV function, moderately elevated pulmonary artery systolic pressure - Continue metoprolol. Further GDMT limited by renal function and low BP  - Volume managed by HD   Otherwise per primary  - Metabolic encephalopathy  - ESRD - Hypothyroidism  - Type 2 DM  - Abnormal CT   For questions or updates, please contact [facility] Please consult [email or web address] for contact info under        Signed, Jonita Albee, PA-C

## 2023-07-20 ENCOUNTER — Other Ambulatory Visit (HOSPITAL_COMMUNITY): Payer: Self-pay

## 2023-07-20 DIAGNOSIS — A419 Sepsis, unspecified organism: Secondary | ICD-10-CM | POA: Diagnosis not present

## 2023-07-20 DIAGNOSIS — J9621 Acute and chronic respiratory failure with hypoxia: Secondary | ICD-10-CM | POA: Diagnosis not present

## 2023-07-20 DIAGNOSIS — G9341 Metabolic encephalopathy: Secondary | ICD-10-CM | POA: Diagnosis not present

## 2023-07-20 DIAGNOSIS — J9602 Acute respiratory failure with hypercapnia: Secondary | ICD-10-CM | POA: Diagnosis not present

## 2023-07-20 LAB — RENAL FUNCTION PANEL
Albumin: 3.7 g/dL (ref 3.5–5.0)
Anion gap: 20 — ABNORMAL HIGH (ref 5–15)
BUN: 73 mg/dL — ABNORMAL HIGH (ref 8–23)
CO2: 23 mmol/L (ref 22–32)
Calcium: 9.9 mg/dL (ref 8.9–10.3)
Chloride: 91 mmol/L — ABNORMAL LOW (ref 98–111)
Creatinine, Ser: 6.4 mg/dL — ABNORMAL HIGH (ref 0.44–1.00)
GFR, Estimated: 6 mL/min — ABNORMAL LOW (ref >60)
Glucose, Bld: 100 mg/dL — ABNORMAL HIGH (ref 70–99)
Phosphorus: 7.5 mg/dL — ABNORMAL HIGH (ref 2.5–4.6)
Potassium: 5.8 mmol/L — ABNORMAL HIGH (ref 3.5–5.1)
Sodium: 134 mmol/L — ABNORMAL LOW (ref 135–145)

## 2023-07-20 LAB — CBC
HCT: 36.7 % (ref 36.0–46.0)
Hemoglobin: 11.2 g/dL — ABNORMAL LOW (ref 12.0–15.0)
MCH: 32.1 pg (ref 26.0–34.0)
MCHC: 30.5 g/dL (ref 30.0–36.0)
MCV: 105.2 fL — ABNORMAL HIGH (ref 80.0–100.0)
Platelets: 227 10*3/uL (ref 150–400)
RBC: 3.49 MIL/uL — ABNORMAL LOW (ref 3.87–5.11)
RDW: 15.1 % (ref 11.5–15.5)
WBC: 14.3 10*3/uL — ABNORMAL HIGH (ref 4.0–10.5)
nRBC: 1.1 % — ABNORMAL HIGH (ref 0.0–0.2)

## 2023-07-20 LAB — GLUCOSE, CAPILLARY
Glucose-Capillary: 112 mg/dL — ABNORMAL HIGH (ref 70–99)
Glucose-Capillary: 86 mg/dL (ref 70–99)

## 2023-07-20 MED ORDER — NEPRO/CARBSTEADY PO LIQD: 237.0000 mL | ORAL | Status: DC | PRN

## 2023-07-20 MED ORDER — LIDOCAINE-PRILOCAINE 2.5-2.5 % EX CREA: 1.0000 | TOPICAL_CREAM | CUTANEOUS | Status: DC | PRN

## 2023-07-20 MED ORDER — PENTAFLUOROPROP-TETRAFLUOROETH EX AERO: 1.0000 | INHALATION_SPRAY | CUTANEOUS | Status: DC | PRN

## 2023-07-20 MED ORDER — ANTICOAGULANT SODIUM CITRATE 4% (200MG/5ML) IV SOLN: 5.0000 mL | Status: DC | PRN

## 2023-07-20 MED ORDER — HEPARIN SODIUM (PORCINE) 1000 UNIT/ML DIALYSIS: 1000.0000 [IU] | INTRAMUSCULAR | Status: DC | PRN

## 2023-07-20 MED ORDER — HEPARIN SODIUM (PORCINE) 1000 UNIT/ML DIALYSIS
2000.0000 [IU] | Freq: Once | INTRAMUSCULAR | Status: AC
  Administered 2023-07-20: 2000 [IU] via INTRAVENOUS_CENTRAL
  Filled 2023-07-20: qty 2

## 2023-07-20 MED ORDER — APIXABAN 5 MG PO TABS
5.0000 mg | ORAL_TABLET | Freq: Two times a day (BID) | ORAL | 2 refills | Status: DC
  Filled 2023-07-20: qty 60, 30d supply, fill #0

## 2023-07-20 MED ORDER — ALTEPLASE 2 MG IJ SOLR: 2.0000 mg | Freq: Once | INTRAMUSCULAR | Status: DC | PRN

## 2023-07-20 MED ORDER — AMIODARONE HCL 200 MG PO TABS
ORAL_TABLET | ORAL | 2 refills | Status: DC
  Filled 2023-07-20 (×2): qty 90, 62d supply, fill #0

## 2023-07-20 MED ORDER — LIDOCAINE HCL (PF) 1 % IJ SOLN: 5.0000 mL | INTRAMUSCULAR | Status: DC | PRN

## 2023-07-20 MED ORDER — HEPARIN SODIUM (PORCINE) 1000 UNIT/ML DIALYSIS
2000.0000 [IU] | INTRAMUSCULAR | Status: DC | PRN
  Filled 2023-07-20: qty 2

## 2023-07-20 MED ORDER — METOPROLOL TARTRATE 25 MG PO TABS
37.5000 mg | ORAL_TABLET | Freq: Two times a day (BID) | ORAL | 1 refills | Status: DC
  Filled 2023-07-20: qty 90, 30d supply, fill #0

## 2023-07-20 MED ORDER — SODIUM ZIRCONIUM CYCLOSILICATE 10 G PO PACK: 10.0000 g | PACK | Freq: Every day | ORAL | Status: DC

## 2023-07-20 MED ORDER — MIDODRINE HCL 5 MG PO TABS
15.0000 mg | ORAL_TABLET | Freq: Three times a day (TID) | ORAL | 1 refills | Status: DC
  Filled 2023-07-20: qty 270, 30d supply, fill #0

## 2023-07-20 NOTE — Progress Notes (Signed)
Rounding Note    Patient Name: [name] [name] Date of Encounter: 07/20/2023  [facility] Cardiologist: Nanetta Batty, MD    Subjective   72 year old female with a history of paroxysmal atrial fibrillation with rapid ventricular response.  She has end-stage renal disease and is on hemodialysis.  She has nonobstructive coronary artery disease by heart catheterization in September, 2024.  History of combined systolic and diastolic congestive heart failure with an EF of 35 to 40%, grade 1 diastolic dysfunction.  Awake but did not respond to me questions   Inpatient Medications    Scheduled Meds:  amiodarone  400 mg Oral BID   apixaban  5 mg Oral BID   arformoterol  15 mcg Nebulization Q12H   budesonide (PULMICORT) nebulizer solution  0.5 mg Nebulization BID   Chlorhexidine Gluconate Cloth  6 each Topical Q0600   clopidogrel  75 mg Oral Daily   doxercalciferol  3 mcg Intravenous Q T,Th,Sa-HD   feeding supplement (NEPRO CARB STEADY)  237 mL Oral BID BM   insulin aspart  0-20 Units Subcutaneous TID WC   insulin aspart  7 Units Subcutaneous TID WC   insulin glargine-yfgn  35 Units Subcutaneous Daily   levothyroxine  175 mcg Oral Q0600   melatonin  3 mg Oral QHS   metoprolol tartrate  37.5 mg Oral BID   midodrine  15 mg Oral Q8H   multivitamin  1 tablet Oral QHS   pantoprazole  40 mg Oral Q1200   polyethylene glycol  17 g Oral Daily   revefenacin  175 mcg Nebulization Daily   rosuvastatin  20 mg Oral Daily   senna-docusate  2 tablet Oral QHS   sevelamer carbonate  1,600 mg Oral TID WC   sodium zirconium cyclosilicate  10 g Oral Daily   Continuous Infusions:  albumin human     anticoagulant sodium citrate     PRN Meds: acetaminophen, alteplase, anticoagulant sodium citrate, feeding supplement (NEPRO CARB STEADY), heparin, [START ON 07/21/2023] heparin, ipratropium-albuterol, lidocaine (PF), lidocaine-prilocaine, metoprolol tartrate, ondansetron (ZOFRAN) IV,  mouth rinse, pentafluoroprop-tetrafluoroeth, polyethylene glycol, white petrolatum   Vital Signs    Vitals:   07/20/23 0930 07/20/23 1000 07/20/23 1030 07/20/23 1100  BP: (!) 125/43 131/74 (!) 135/55 114/63  Pulse:   76 77  Resp: (!) 37 20 15 (!) 22  Temp:      TempSrc:      SpO2: 99% 99% 98% 91%  Weight:      Height:        Intake/Output Summary (Last 24 hours) at 07/20/2023 1108 Last data filed at 07/20/2023 0300 Gross per 24 hour  Intake 365.34 ml  Output --  Net 365.34 ml      07/20/2023    8:46 AM 07/19/2023    5:00 AM 07/18/2023    8:05 AM  Last 3 Weights  Weight (lbs) 221 lb 12.5 oz 219 lb 9.3 oz 222 lb 14.2 oz  Weight (kg) 100.6 kg 99.6 kg 101.1 kg      Telemetry    NSR  - Personally Reviewed  ECG     - Personally Reviewed  Physical Exam   GEN: elderly female,  No acute distress.  Did not respond to questin s Neck: No JVD Cardiac: RRR, no murmurs, rubs, or gallops.  Respiratory: Clear to auscultation bilaterally. GI: Soft, nontender, non-distended  MS: No edema; No deformity. Neuro:  Nonfocal  Psych: Normal affect   Labs    High Sensitivity Troponin:  Recent Labs  Lab 07/11/23 1321 07/11/23 1611 07/12/23 0308 07/13/23 1758 07/14/23 0801  TROPONINIHS 90* 103* 83* 158* 150*     Chemistry Recent Labs  Lab 07/15/23 0345 07/16/23 0242 07/17/23 0331 07/18/23 0558 07/20/23 0239  NA 132* 133* 135 135 134*  K 5.1 5.2* 4.7 4.9 5.8*  CL 92* 91* 92* 89* 91*  CO2 24 26 27 28 23   GLUCOSE 175* 152* 83 78 100*  BUN 62* 76* 55* 79* 73*  CREATININE 5.50* 7.04* 5.47* 7.32* 6.40*  CALCIUM 9.9 9.8 9.9 9.7 9.9  MG 2.0 2.1 2.0  --   --   ALBUMIN  --  3.5 3.8 3.8 3.7  GFRNONAA 8* 6* 8* 5* 6*  ANIONGAP 16* 16* 16* 18* 20*    Lipids No results for input(s): "CHOL", "TRIG", "HDL", "LABVLDL", "LDLCALC", "CHOLHDL" in the last 168 hours.  Hematology Recent Labs  Lab 07/17/23 0331 07/18/23 0558 07/20/23 0239  WBC 10.5 11.9* 14.3*  RBC 3.32* 3.37*  3.49*  HGB 10.4* 10.2* 11.2*  HCT 35.8* 35.2* 36.7  MCV 107.8* 104.5* 105.2*  MCH 31.3 30.3 32.1  MCHC 29.1* 29.0* 30.5  RDW 14.9 15.0 15.1  PLT 248 239 227   Thyroid No results for input(s): "TSH", "FREET4" in the last 168 hours.  BNPNo results for input(s): "BNP", "PROBNP" in the last 168 hours.  DDimer No results for input(s): "DDIMER" in the last 168 hours.   Radiology    No results found.  Cardiac Studies      Patient Profile     72 y.o. female    Assessment & Plan     1.  Atrial fibrillation with rapid ventricular response: She is back on amiodarone.  She is converted back to sinus rhythm.  Continue amiodarone for now.  Continue Eliquis.  Continue metoprolol.  2.  Nonobstructive coronary artery disease: She is not having any episodes of angina.  Continue Crestor and metoprolol.  Plavix has been stopped now that she is on Eliquis.  3.  Combined systolic and diastolic congestive heart failure: She has EF of 35 to 40% with grade 1 diastolic dysfunction.  Continue medical management.  4.  Metabolic encephalopathy.  She is unresponsive to me this morning.  Further management per the primary team.       For questions or updates, please contact [facility] Please consult [email or web address] for contact info under        Signed, Kristeen Miss, MD  07/20/2023, 11:08 AM

## 2023-07-20 NOTE — Plan of Care (Signed)
Problem: Education: Goal: Understanding of CV disease, CV risk reduction, and recovery process will improve Outcome: Progressing Goal: Individualized Educational Video(s) Outcome: Progressing   Problem: Activity: Goal: Ability to return to baseline activity level will improve Outcome: Progressing

## 2023-07-20 NOTE — TOC Transition Note (Signed)
Transition of Care [facility]) - CM/SW Discharge Note   Patient Details  Name: AMALIE KORAN MRN: [id number] Date of Birth: Jun 05, 1951  Transition of Care [facility]) CM/SW Contact:  Lawerance Sabal, RN Phone Number: 07/20/2023, 1:02 PM   Clinical Narrative:     [facility] that patient will DC today.  PTAR set up for after 2pm.   Final next level of care: Home w Home Health Services Barriers to Discharge: Continued Medical Work up   Patient Goals and CMS Choice CMS Medicare.gov Compare Post Acute Care list provided to:: Other (Comment Required) (Already current with [facility]) Choice offered to / list presented to : NA  Discharge Placement                         Discharge Plan and Services Additional resources added to the After Visit Summary for   In-house Referral: NA   Post Acute Care Choice: Home Health [facility])          DME Arranged: N/A DME Agency: [facility]       HH Arranged:  (Will rees[facility] services with [facility]) HH Agency: [facility] Date HH Agency Contacted: 07/17/23 Time HH Agency Contacted: 1602 Representative spoke with at [facility] Agency: Lynnette  Social Determinants of Health (SDOH) Interventions SDOH Screenings   Food Insecurity: No Food Insecurity (07/05/2023)  Housing: Patient Declined (07/05/2023)  Recent Concern: Housing - Medium Risk (04/13/2023)  Transportation Needs: No Transportation Needs (07/05/2023)  Utilities: Not At Risk (07/05/2023)  Tobacco Use: Medium Risk (07/11/2023)     Readmission Risk Interventions    05/09/2023    3:37 PM 05/08/2023   12:17 PM  Readmission Risk Prevention Plan  Transportation Screening Complete Complete  Medication Review [employer]) Complete Complete  PCP or Specialist appointment within 3-5 days of discharge Complete Complete  HRI or Home Care Consult Complete Complete  SW Recovery Care/Counseling Consult Complete Complete  Palliative Care Screening Not Applicable Not Applicable   Skilled Nursing Facility Complete Complete

## 2023-07-20 NOTE — Progress Notes (Signed)
Patient returns from dialysis at this time

## 2023-07-20 NOTE — Discharge Summary (Addendum)
PATIENT DETAILS Name: [name] [name] Age: 72 y.o. Sex: female Date of Birth: [birth year] MRN: [id number]. Admitting Physician: Josephine Igo, DO GMW:NUUV, [name], FNP  Admit Date: 07/11/2023 Discharge date: 07/20/2023  Recommendations for Outpatient Follow-up:  Follow up with PCP in 1-2 weeks Please obtain CMP/CBC in one week Please ensure follow-up with cardiology Please continue outpatient follow-up with nephrology/dialysis clinic Please ensure follow-up with oncology-please see CT chest on 10/3-some concern that there is enlarging soft tissue in the prevascular space suspicious for metastatic disease.  Probably requires outpatient PET scan.  Admitted From:  Home  Disposition: Home health   Discharge Condition: good  CODE STATUS:   Code Status: Limited: Do not attempt resuscitation (DNR) -DNR-LIMITED -Do Not Intubate/DNI    Diet recommendation:  Diet Order             Diet - low sodium heart healthy           Diet Carb Modified           DIET DYS 3 Room service appropriate? Yes; Fluid consistency: Thin; Fluid restriction: 1200 mL Fluid  Diet effective now                    Brief Summary: Patient is a 72 y.o.  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  She was stabilized and transferred to [facility] on 10/9.     Significant events: 10/3>> admit to PCCM 10/9>> transferred to [facility] 10/10>> A-fib RVR-cardiology consulted-amiodarone started-subsequently converted back to  sinus rhythm.   Significant studies: 10/3>> CT head: No acute intracranial abnormality 10/3>> CT chest: Progressive enlargement of the soft tissue in prevascular space-suspicious for metastatic disease.  Unchanged left greater than right pleural effusion.  New groundglass opacities left upper lobe.   Significant microbiology data: 10/4>> blood culture: No growth   Procedures: None   Consults: PCCM Nephrology  Brief Hospital Course: Acute on chronic hypoxemic and hypercarbic respiratory failure due to a combination of volume overload and COPD exacerbation (on 3 L of oxygen at baseline) Improved with bronchodilators/HD sessions Down to 3 L of oxygen Continue to treat underlying etiologies-continue pulmonary toileting.   Acute metabolic encephalopathy Secondary to hypercarbia/hypoxemia Improved with BiPAP Refuses to use BiPAP here in 5 W.-claims intolerant.  Understands life-threatening/life disabling consequences.   Culture negative septic shock Sepsis physiology has resolved All cultures negative Per PCCM note on 10/8-7 days of empiric Augmentin/Zyvox-completed on 10/10.   SVT Occurred while in the ICU Maintaining sinus rhythm this morning Continue metoprolol.   PAF with RVR RVR on 10/10-evaluated by cardiology-started on amiodarone-subsequently converted to sinus rhythm Remains on beta-blockers Now on oral amiodarone Eliquis started given CHA2DS2-VASc of at least 6. Evaluated by cardiology during this hospitalization-please ensure outpatient follow-up with cardiology.   Acute on chronic HFrEF (EF 35-40% by TTE on 04/15/2023) Euvolemic on exam this morning Volume removal with hemodialysis. On midodrine-with BP soft but stable.   Nonobstructive CAD on recent LHC on 9/27 No chest pain Plavix discontinued-as the patient now on Eliquis Continue beta-blocker/statin   History of PAD Statin  See above regarding Plavix.   ESRD on HD TTS Nephrology following    Normocytic anemia Secondary to combination of ESRD/critical illness Aranesp/iron deferred to nephrology service Follow CBC   Hypothyroidism Synthroid TSH 10/3 stable   DM-2 (A1c 7.1 on 7/6) Resume usual outpatient regimen on discharge  History of lung cancer-s/p radiation Will need outpatient follow-up with oncology and PET scan.  Some concern for metastatic disease on CT imaging done on admission. Note-epic message sent to her primary oncologist on the day of discharge.   Osteoarthritis of bilateral knees Stable at present-outpatient orthopedic evaluation   Constipation Had BM overnight Continue MiraLAX/senna   Debility/deconditioning Per patient-she has not ambulated in close to a month-previously minimally ambulatory and mostly bed to wheelchair bound PT/OT eval-Home health recommended.   Nutrition Status: Nutrition Problem: Increased nutrient needs Etiology: chronic illness (ESRD on HD) Signs/Symptoms: estimated needs Interventions: Refer to RD note for recommendations   Morbid Obesity: Estimated body mass index is 39.29 kg/m as calculated from the following:   Height as of this encounter: 5\' 3"  (1.6 m).   Weight as of this encounter: 100.6 kg.   Discharge Diagnoses:  Principal Problem:   Acute respiratory failure with hypercapnia (HCC) Active Problems:   Acute on chronic respiratory failure with hypoxia and hypercapnia (HCC)   Acute metabolic encephalopathy   Septic shock [facility])   Discharge Instructions:  Activity:  As tolerated with Full fall precautions use walker/cane & assistance as needed  Discharge Instructions     Call MD for:  difficulty breathing, headache or visual disturbances   Complete by: As directed    Diet - low sodium heart healthy   Complete by: As directed    Diet Carb Modified   Complete by: As directed    Discharge instructions   Complete by: As directed    Follow with Primary MD  Vladimir Crofts, FNP in 1-2 weeks  Follow-up  with the cardiologist-the office will call you with a follow-up appointment  Follow-up with your hemodialysis clinic at your usual schedule.  Please get a complete blood count and chemistry panel checked by your Primary MD at your next visit, and again as instructed by your Primary MD.  Get Medicines reviewed and adjusted: Please take all your medications with you for your next visit with your Primary MD  Laboratory/radiological data: Please request your Primary MD to go over all hospital tests and procedure/radiological results at the follow up, please ask your Primary MD to get all Hospital records sent to his/her office.  In some cases, they will be blood work, cultures and biopsy results pending at the time of your discharge. Please request that your primary care M.D. follows up on these results.  Also Note the following: If you experience worsening of your admission symptoms, develop shortness of breath, life threatening emergency, suicidal or homicidal thoughts you must seek medical attention immediately by calling 911 or calling your MD immediately  if symptoms less severe.  You must read complete instructions/literature along with all the possible adverse reactions/side effects for all the Medicines you take and that have been prescribed to you. Take any new Medicines after you have completely understood and accpet all the possible adverse reactions/side effects.   Do not drive when taking Pain medications or sleeping medications (Benzodaizepines)  Do not take more than prescribed Pain, Sleep and Anxiety Medications. It is not advisable to combine anxiety,sleep and pain medications without talking with your primary care practitioner  Special Instructions: If  you have smoked or chewed Tobacco  in the last 2 yrs please stop smoking, stop any regular Alcohol  and or any Recreational drug use.  Wear Seat belts while driving.  Please note: You were cared for by a hospitalist during  your hospital stay. Once you are discharged, your primary care physician will handle any further medical issues. Please note that NO REFILLS for any discharge medications will be authorized once you are discharged, as it is imperative that you return to your primary care physician (or establish a relationship with a primary care physician if you do not have one) for your post hospital discharge needs so that they can reassess your need for medications and monitor your lab values.   Increase activity slowly   Complete by: As directed    No wound care   Complete by: As directed       Allergies as of 07/20/2023       Reactions   Nebivolol Swelling   Chest pain (reaction to Bystolic)   Zestril [lisinopril] Swelling, Other (See Comments)   Ace Inhibitors Swelling, Other (See Comments)   Tongue swell   Morphine And Codeine Itching        Medication List     STOP taking these medications    carvedilol 6.25 MG tablet Commonly known as: COREG   clopidogrel 75 MG tablet Commonly known as: PLAVIX       TAKE these medications    acetaminophen 500 MG tablet Commonly known as: TYLENOL Take 1 tablet (500 mg total) by mouth See admin instructions. Take 1,000mg  (2 tablets) by mouth twice a day in addition to 1,000mg  (2 tablets) twice daily as needed for pain.   albuterol (2.5 MG/3ML) 0.083% nebulizer solution Commonly known as: PROVENTIL Take 3 mLs (2.5 mg total) by nebulization every 6 (six) hours as needed for wheezing or shortness of breath.   albuterol 108 (90 Base) MCG/ACT inhaler Commonly known as: VENTOLIN HFA Inhale 2 puffs into the lungs every 6 (six) hours as needed for shortness of breath (COPD).   allopurinol 100 MG tablet Commonly known as: ZYLOPRIM Take 1 tablet (100 mg total) by mouth daily.   amiodarone 200 MG tablet Commonly known as: PACERONE amiodarone 400 mg BID for 7 days followed by 400 mg daily for 7 days, then 200 mg daily   apixaban 5 MG Tabs  tablet Commonly known as: ELIQUIS Take 1 tablet (5 mg total) by mouth 2 (two) times daily.   budesonide-formoterol 160-4.5 MCG/ACT inhaler Commonly known as: SYMBICORT Inhale 2 puffs into the lungs in the morning and at bedtime.   carboxymethylcellulose 0.5 % Soln Commonly known as: REFRESH PLUS Place 1 drop into both eyes 3 (three) times daily as needed (dry/irritated eyes.).   diclofenac Sodium 1 % Gel Commonly known as: VOLTAREN Apply 2 g topically 4 (four) times daily.   docusate sodium 100 MG capsule Commonly known as: COLACE Take 1 capsule (100 mg total) by mouth daily.   feeding supplement (NEPRO CARB STEADY) Liqd Take 237 mLs by mouth 2 (two) times daily between meals.   fluticasone 50 MCG/ACT nasal spray Commonly known as: FLONASE Place 1 spray into both nostrils daily.   Incruse Ellipta 62.5 MCG/ACT Aepb Generic drug: umeclidinium bromide Inhale 1 puff into the lungs daily.   insulin lispro 100 UNIT/ML KwikPen Commonly known as: HumaLOG KwikPen 0-15 Units, Subcutaneous, 3 times daily with meal CBG < 70: Implement Hypoglycemia meausers CBG 70 - 120: 0 units CBG 121 -  150: 2 units CBG 151 - 200: 3 units CBG 201 - 250: 5 units CBG 251 - 300: 8 units CBG 301 - 350: 11 units CBG 351 - 400: 15 units CBG > 400: call MD What changed:  how much to take how to take this when to take this   levothyroxine 175 MCG tablet Commonly known as: SYNTHROID Take 1 tablet (175 mcg total) by mouth daily.   lidocaine 4 % Place 2 patches onto the skin daily. Each lower side of back.   lidocaine-prilocaine cream Commonly known as: EMLA Apply 1 Application topically See admin instructions. On dialysis days (Tues,Thurs,Sat)   meclizine 25 MG tablet Commonly known as: ANTIVERT Take 25 mg by mouth daily as needed.   metoprolol tartrate 25 MG tablet Commonly known as: LOPRESSOR Take 1.5 tablets (37.5 mg total) by mouth 2 (two) times daily.   midodrine 5 MG tablet Commonly known  as: PROAMATINE Take 3 tablets (15 mg total) by mouth every 8 (eight) hours.   MIRCERA IJ Inject 1 Syringe into the skin every 14 (fourteen) days.   multivitamins with iron Tabs tablet Take 1 tablet by mouth in the morning.   nitroGLYCERIN 0.4 MG SL tablet Commonly known as: Nitrostat Place 1 tablet (0.4 mg total) under the tongue every 5 (five) minutes as needed for chest pain.   ondansetron 4 MG disintegrating tablet Commonly known as: ZOFRAN-ODT Take 2 mg by mouth every 6 (six) hours as needed for nausea or vomiting.   OXYGEN Inhale into the lungs.   pantoprazole 40 MG tablet Commonly known as: PROTONIX Take 1 tablet (40 mg total) by mouth 2 (two) times daily before a meal.   pregabalin 25 MG capsule Commonly known as: LYRICA Take 25 mg by mouth daily.   rosuvastatin 20 MG tablet Commonly known as: CRESTOR Take 1 tablet (20 mg total) by mouth daily.   sevelamer carbonate 800 MG tablet Commonly known as: RENVELA Take 2 tablets (1,600 mg total) by mouth 3 (three) times daily with meals.   thiamine 100 MG tablet Commonly known as: VITAMIN B1 Take 1 tablet (100 mg total) by mouth in the morning.        Follow-up Information     Vladimir Crofts, FNP. Schedule an appointment as soon as possible for a visit in 1 week(s).   Specialty: Family Medicine Contact information: [address] [place] [address] [phone]         Runell Gess, MD Follow up on 08/14/2023.   Specialties: Cardiology, Radiology Why: appointment at 1:30 pm Contact information: [address] [place] [address] [phone]         Hemodialysis clinic Follow up.   Why: Keep your existing schedule.               Allergies  Allergen Reactions   Nebivolol Swelling    Chest pain (reaction to Bystolic)   Zestril [Lisinopril] Swelling and Other (See Comments)   Ace Inhibitors Swelling and Other (See Comments)    Tongue swell   Morphine  And Codeine Itching     Other Procedures/Studies: DG CHEST PORT 1 VIEW  Result Date: 07/16/2023 CLINICAL DATA:  Shortness of breath EXAM: PORTABLE CHEST 1 VIEW COMPARISON:  07/12/2023 FINDINGS: Gross cardiomegaly. Diffuse bilateral interstitial pulmonary opacity and retrocardiac atelectasis or consolidation. No acute osseous findings. IMPRESSION: Gross cardiomegaly with diffuse bilateral interstitial pulmonary opacity and retrocardiac atelectasis or consolidation. Findings are consistent with edema or  infection. No focal airspace opacity. Electronically Signed   By: Jearld Lesch M.D.   On: 07/16/2023 11:27   DG Abd Portable 1V  Result Date: 07/12/2023 CLINICAL DATA:  Feeding tube placement EXAM: PORTABLE ABDOMEN - 1 VIEW COMPARISON:  11/27/2018 FINDINGS: The curvature of the feeding tube implies that its tip is in the fourth portion of the duodenum. Cardiomegaly noted with hazy opacity at the left lung base likely representing pleural effusion. Thoracic aortic atheromatous vascular calcification. IMPRESSION: 1. Feeding tube tip in the fourth portion of the duodenum. 2. Cardiomegaly with left pleural effusion. 3.  Aortic Atherosclerosis (ICD10-I70.0). Electronically Signed   By: Gaylyn Rong M.D.   On: 07/12/2023 15:57   DG CHEST PORT 1 VIEW  Result Date: 07/12/2023 CLINICAL DATA:  Pulmonary edema, altered mental status EXAM: PORTABLE CHEST 1 VIEW COMPARISON:  07/11/2023 FINDINGS: Cardiomegaly. Diffuse bilateral interstitial pulmonary opacity. Retrocardiac atelectasis or consolidation. No new or focal airspace opacity. Osseous structures unremarkable. IMPRESSION: 1. Cardiomegaly with diffuse bilateral interstitial pulmonary opacity, consistent with edema or atypical/viral infection. 2. Retrocardiac atelectasis or consolidation. No new or focal airspace opacity. Electronically Signed   By: Jearld Lesch M.D.   On: 07/12/2023 15:46   CT Chest Wo Contrast  Result Date: 07/11/2023 CLINICAL DATA:   Respiratory illness, nondiagnostic x-ray. History of non-small cell lung cancer per previous imaging studies. * Tracking Code: BO * EXAM: CT CHEST WITHOUT CONTRAST TECHNIQUE: Multidetector CT imaging of the chest was performed following the standard protocol without IV contrast. RADIATION DOSE REDUCTION: This exam was performed according to the departmental dose-optimization program which includes automated exposure control, adjustment of the mA and/or kV according to patient size and/or use of iterative reconstruction technique. COMPARISON:  Chest CT 05/06/2023 and 11/09/2022. Cardiac PET-CT 05/16/2023. FDG PET-CT 07/09/2022. Radiographs 07/11/2023. FINDINGS: Cardiovascular: Atherosclerosis of the aorta, great vessels and coronary arteries. Probable calcifications of the aortic valve. Stable moderate cardiomegaly without evidence of pericardial effusion. Mediastinum/Nodes: There is a well-circumscribed soft tissue nodule in the pre-vascular space which has enlarged from the previous chest CT, measuring 2.5 x 1.7 cm on image 43/3 (previously 1.8 x 1.1 cm).No other enlarged mediastinal, hilar or axillary lymph nodes are identified. Hilar assessment is limited by the lack of intravenous contrast and the basilar pulmonary opacities. The esophagus is mildly distended and fluid-filled, but unchanged. The thyroid gland appears unremarkable. Lungs/Pleura: Unchanged small left greater than right pleural effusions. Persistent complete left lower lobe collapse with occlusion of the left lung bronchi at the hilum. Bandlike opacities in the right lower lobe and associated volume loss have mildly increased from the prior CT of 2 months ago. There are new ground-glass opacities in the left upper lobe which are nonspecific and may represent edema or atypical inflammation. No focal pulmonary nodules are identified within the aerated portions of the lungs. Upper abdomen: No acute or significant findings are seen in the visualized  upper abdomen. Bilateral renal cysts are grossly stable from previous PET-CT; no specific follow-up imaging recommended. Stable low-density enlargement of both adrenal glands consistent with adenomas or hyperplasia; no follow-up imaging recommended. Musculoskeletal/Chest wall: No osseous metastases or acute fractures are identified. There are old rib fractures bilaterally. No evidence of chest wall mass. Unless specific follow-up recommendations are mentioned in the findings or impression sections, no imaging follow-up of any mentioned incidental findings is recommended. IMPRESSION: 1. Progressive enlargement of a soft tissue nodule in the pre-vascular space, suspicious for metastatic disease. 2. Unchanged small left greater than right pleural  effusions with persistent complete left lower lobe collapse and occlusion of the left lung bronchi at the hilum. This could be secondary to tumor recurrence or chronic mucous plugging. Recommend oncology follow-up. Follow-up PET-CT may be helpful for further evaluation. 3. New ground-glass opacities in the left upper lobe, nonspecific, but potentially due to edema or atypical inflammation. 4. No evidence of osseous metastatic disease. 5.  Aortic Atherosclerosis (ICD10-I70.0). Electronically Signed   By: Carey Bullocks M.D.   On: 07/11/2023 17:11   CT Head Wo Contrast  Result Date: 07/11/2023 CLINICAL DATA:  Mental status change EXAM: CT HEAD WITHOUT CONTRAST TECHNIQUE: Contiguous axial images were obtained from the base of the skull through the vertex without intravenous contrast. RADIATION DOSE REDUCTION: This exam was performed according to the departmental dose-optimization program which includes automated exposure control, adjustment of the mA and/or kV according to patient size and/or use of iterative reconstruction technique. COMPARISON:  CT head 12/15/2022 FINDINGS: Brain: No intracranial hemorrhage, mass effect, or evidence of acute infarct. No hydrocephalus. No  extra-axial fluid collection. Generalized cerebral atrophy and chronic small vessel ischemic disease. Chronic bilateral basal ganglia infarcts. Vascular: No hyperdense vessel. Intracranial arterial calcification. Skull: No fracture or focal lesion. Sinuses/Orbits: No acute finding. Other: None. IMPRESSION: 1. No acute intracranial abnormality. Electronically Signed   By: Minerva Fester M.D.   On: 07/11/2023 16:56   DG Chest Port 1 View  Result Date: 07/11/2023 CLINICAL DATA:  Altered mental status, lethargy EXAM: PORTABLE CHEST 1 VIEW COMPARISON:  05/06/2023 FINDINGS: Stable cardiomegaly. Aortic atherosclerotic calcification. Pulmonary vascular congestion. Bilateral interstitial coarsening. Retrocardiac atelectasis and small left pleural effusion. Right basilar atelectasis. No pneumothorax. IMPRESSION: No significant change from 05/06/2023. Constellation of findings consistent with congestive heart failure. Small left pleural effusion and left lower lung atelectasis or pneumonia. Electronically Signed   By: Minerva Fester M.D.   On: 07/11/2023 15:50   CARDIAC CATHETERIZATION  Result Date: 07/05/2023   Prox Cx to Mid Cx lesion is 40% stenosed.   Mid Cx lesion is 30% stenosed.   Dist LAD lesion is 10% stenosed.   Recommend Aspirin 81mg  daily for moderate CAD. There is significant coronary calcification involving the LAD and left circumflex vessel. Nonobstructive CAD with smooth 10% mid LAD narrowings and left circumflex calcification with 40% proximal stenosis and smooth 30% mid AV groove stenosis. Normal nondominant RCA. RECOMMENDATION: Medical therapy for mild nonobstructive CAD.  Guideline directed therapy with reduced LV function in this patient with end-stage renal disease.  Since the patient does not have anyone at home, she will stay overnight for observation.  Plans are for dialysis tomorrow and apparently it has been arranged that she would be picked up to leave the hospital in early a.m.and taken  for her dialysis.     TODAY-DAY OF DISCHARGE:  Subjective:   Starleigh Rennie today has no headache,no chest abdominal pain,no new weakness tingling or numbness, feels much better wants to go home today.   Objective:   Blood pressure 116/89, pulse 80, temperature (!) 97.3 F (36.3 C), resp. rate 16, height 5\' 3"  (1.6 m), weight 100.6 kg, SpO2 97%.  Intake/Output Summary (Last 24 hours) at 07/20/2023 0903 Last data filed at 07/20/2023 0300 Gross per 24 hour  Intake 365.34 ml  Output --  Net 365.34 ml   Filed Weights   07/18/23 0805 07/19/23 0500 07/20/23 0846  Weight: 101.1 kg 99.6 kg 100.6 kg    Exam: Awake Alert, Oriented *3, No new F.N deficits, Normal affect [place].AT,PERRAL  Supple Neck,No JVD, No cervical lymphadenopathy appriciated.  Symmetrical Chest wall movement, Good air movement bilaterally, CTAB RRR,No Gallops,Rubs or new Murmurs, No Parasternal Heave +ve B.Sounds, Abd Soft, Non tender, No organomegaly appriciated, No rebound -guarding or rigidity. No Cyanosis, Clubbing or edema, No new Rash or bruise   PERTINENT RADIOLOGIC STUDIES: No results found.   PERTINENT LAB RESULTS: CBC: Recent Labs    07/18/23 0558 07/20/23 0239  WBC 11.9* 14.3*  HGB 10.2* 11.2*  HCT 35.2* 36.7  PLT 239 227   CMET CMP     Component Value Date/Time   NA 134 (L) 07/20/2023 0239   NA 138 07/01/2023 1522   K 5.8 (H) 07/20/2023 0239   CL 91 (L) 07/20/2023 0239   CO2 23 07/20/2023 0239   GLUCOSE 100 (H) 07/20/2023 0239   BUN 73 (H) 07/20/2023 0239   BUN 95 (HH) 07/01/2023 1522   CREATININE 6.40 (H) 07/20/2023 0239   CREATININE 5.92 (H) 06/19/2023 0718   CREATININE 1.32 (H) 06/29/2014 1100   CALCIUM 9.9 07/20/2023 0239   CALCIUM 9.1 09/07/2021 1036   PROT 7.4 07/11/2023 1321   PROT 7.3 12/23/2013 1132   ALBUMIN 3.7 07/20/2023 0239   ALBUMIN 4.1 12/23/2013 1132   AST 23 07/11/2023 1321   AST 17 06/19/2023 0718   ALT 13 07/11/2023 1321   ALT 10 06/19/2023 0718    ALKPHOS 81 07/11/2023 1321   BILITOT 0.4 07/11/2023 1321   BILITOT 0.4 06/19/2023 0718   EGFR 5 (L) 07/01/2023 1522   GFRNONAA 6 (L) 07/20/2023 0239   GFRNONAA 7 (L) 06/19/2023 0718    GFR Estimated Creatinine Clearance: 9 mL/min (A) (by C-G formula based on SCr of 6.4 mg/dL (H)). No results for input(s): "LIPASE", "AMYLASE" in the last 72 hours. No results for input(s): "CKTOTAL", "CKMB", "CKMBINDEX", "TROPONINI" in the last 72 hours. Invalid input(s): "POCBNP" No results for input(s): "DDIMER" in the last 72 hours. No results for input(s): "HGBA1C" in the last 72 hours. No results for input(s): "CHOL", "HDL", "LDLCALC", "TRIG", "CHOLHDL", "LDLDIRECT" in the last 72 hours. No results for input(s): "TSH", "T4TOTAL", "T3FREE", "THYROIDAB" in the last 72 hours.  Invalid input(s): "FREET3" No results for input(s): "VITAMINB12", "FOLATE", "FERRITIN", "TIBC", "IRON", "RETICCTPCT" in the last 72 hours. Coags: No results for input(s): "INR" in the last 72 hours.  Invalid input(s): "PT" Microbiology: Recent Results (from the past 240 hour(s))  MRSA Next Gen by PCR, Nasal     Status: None   Collection Time: 07/11/23  5:32 PM   Specimen: Nasal Mucosa; Nasal Swab  Result Value Ref Range Status   MRSA by PCR Next Gen NOT DETECTED NOT DETECTED Final    Comment: (NOTE) The GeneXpert MRSA Assay (FDA approved for NASAL specimens only), is one component of a comprehensive MRSA colonization surveillance program. It is not intended to diagnose MRSA infection nor to guide or monitor treatment for MRSA infections. Test performance is not FDA approved in patients less than 54 years old. Performed at [facility], [address], [place], [place] [address]   Culture, blood (Routine X 2) w Reflex to ID Panel     Status: None   Collection Time: 07/12/23  8:38 AM   Specimen: BLOOD  Result Value Ref Range Status   Specimen Description BLOOD BLOOD RIGHT HAND  Final   Special Requests   Final     AEROBIC BOTTLE ONLY Blood Culture results may not be optimal due to an inadequate volume of blood received  in culture bottles   Culture   Final    NO GROWTH 5 DAYS Performed at [facility], [address], [place], [place] [address]    Report Status 07/17/2023 FINAL  Final  Culture, blood (Routine X 2) w Reflex to ID Panel     Status: None   Collection Time: 07/12/23  8:41 AM   Specimen: BLOOD  Result Value Ref Range Status   Specimen Description BLOOD BLOOD RIGHT HAND  Final   Special Requests   Final    AEROBIC BOTTLE ONLY Blood Culture results may not be optimal due to an inadequate volume of blood received in culture bottles   Culture   Final    NO GROWTH 5 DAYS Performed at [facility], [address], [place], [place] [address]    Report Status 07/17/2023 FINAL  Final    FURTHER DISCHARGE INSTRUCTIONS:  Get Medicines reviewed and adjusted: Please take all your medications with you for your next visit with your Primary MD  Laboratory/radiological data: Please request your Primary MD to go over all hospital tests and procedure/radiological results at the follow up, please ask your Primary MD to get all Hospital records sent to his/her office.  In some cases, they will be blood work, cultures and biopsy results pending at the time of your discharge. Please request that your primary care M.D. goes through all the records of your hospital data and follows up on these results.  Also Note the following: If you experience worsening of your admission symptoms, develop shortness of breath, life threatening emergency, suicidal or homicidal thoughts you must seek medical attention immediately by calling 911 or calling your MD immediately  if symptoms less severe.  You must read complete instructions/literature along with all the possible adverse reactions/side effects for all the Medicines you take and that have been prescribed to you. Take any new Medicines after you  have completely understood and accpet all the possible adverse reactions/side effects.   Do not drive when taking Pain medications or sleeping medications (Benzodaizepines)  Do not take more than prescribed Pain, Sleep and Anxiety Medications. It is not advisable to combine anxiety,sleep and pain medications without talking with your primary care practitioner  Special Instructions: If you have smoked or chewed Tobacco  in the last 2 yrs please stop smoking, stop any regular Alcohol  and or any Recreational drug use.  Wear Seat belts while driving.  Please note: You were cared for by a hospitalist during your hospital stay. Once you are discharged, your primary care physician will handle any further medical issues. Please note that NO REFILLS for any discharge medications will be authorized once you are discharged, as it is imperative that you return to your primary care physician (or establish a relationship with a primary care physician if you do not have one) for your post hospital discharge needs so that they can reassess your need for medications and monitor your lab values.  Total Time spent coordinating discharge including counseling, education and face to face time equals greater than 30 minutes.  SignedJeoffrey Massed 07/20/2023 9:03 AM

## 2023-07-20 NOTE — Progress Notes (Signed)
Received patient in bed to unit.  Alert and oriented.  Informed consent signed and in chart.   TX duration:3  Patient tolerated well.  Transported back to the room  Alert, without acute distress.  Hand-off given to patient's nurse.   Access used: left AVG Access issues: none-  Total UF removed: 2.5L Medication(s) given: hectorol   07/20/23 1212  Vitals  Temp (!) 96.8 F (36 C)  BP (!) 99/37  MAP (mmHg) (!) 49  BP Location Right Leg  BP Method Automatic  Patient Position (if appropriate) Lying  Pulse Rate Source Monitor  ECG Heart Rate 79  Resp (!) 23  Oxygen Therapy  SpO2 99 %  O2 Device Nasal Cannula  O2 Flow Rate (L/min) 3 L/min  During Treatment Monitoring  HD Safety Checks Performed Yes  Intra-Hemodialysis Comments Tx completed  Dialysis Fluid Bolus Normal Saline  Bolus Amount (mL) 300 mL    [name] [name] [facility]

## 2023-07-20 NOTE — Progress Notes (Signed)
Discharge instructions given with stated understanding.  Medications provided from [facility] pharmacy.  Patient waiting for transportation home via [facility]

## 2023-07-20 NOTE — Progress Notes (Signed)
Per Dr Alen Blew did not respond to him  Reevaluated patient at hemodialysis-she is now wide-awake-talking and responding appropriately. Suspect she may have been sleeping and was somewhat slow to arouse when seen by cardiology earlier.

## 2023-07-20 NOTE — Progress Notes (Signed)
[name] Progress Note  Subjective: seen in HD. No c/o's today  Vitals:   07/20/23 1000 07/20/23 1030 07/20/23 1100 07/20/23 1130  BP: 131/74 (!) 135/55 114/63 135/76  Pulse:  76 77   Resp: 20 15 (!) 22 (!) 27  Temp:      TempSrc:      SpO2: 99% 98% 91% 100%  Weight:      Height:        Exam: Gen calm, [place] O2,  No jvd or bruits Chest normal BS, no rales RRR no RG Abd soft ntnd no mass or ascites +bs Ext no LE edema    LUA AVG+bruit     Renal-related home meds: - coreg 6.25 bid - home O2  - pregabalin 25 every day - renvela 2 ac tid        OP HD: [facility] TTS 4h   400/600  99kg  2/2 bath   aVG   Heparin 2000+ 2000 midrun - last HD 10/3 today, post wt 106kg - 3wks ago coming off 3 over, this last week coming off 7kg up - hectorol 3 mcg  - venofer 100mg  three times per week, thru 10/05 - mircera 50 mcg IV q 2 wks, last 10/3, due 10/17    CXR today 10/08- vasc congestion, early IS edema   Assessment/ Plan: Septic shock - cx's were negative. S/P course of abx x 7 days.  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 Stable.  ESRD - on HD TTS. Had HD here yesterday, next HD Saturday.  Volume overload - post HD wt today is lowest wt here at 98kg. This should be her new dry wt Hypotension - getting metoprolol bid here. Started midodrine here, currently at 15 tid.  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 No esa needs at this time.  MBD ckd - CCa in range, phos a bit high. Resuming IV vdra and renvela as binder.  Chronic resp failure/ COPD - is on 3 L O2 at home HFrEF - last EF 35-40% in July  Limited DNR - do not intubate DM2 - per pmd Dispo - likely dc today       [name] MD  [facility] 07/20/2023, 12:04 PM  Recent Labs  Lab 07/18/23 0558 07/20/23 0239  HGB 10.2* 11.2*  ALBUMIN 3.8 3.7  CALCIUM 9.7 9.9  PHOS 7.4* 7.5*  CREATININE 7.32* 6.40*  K 4.9 5.8*   No results for  input(s): "IRON", "TIBC", "FERRITIN" in the last 168 hours. Inpatient medications:  amiodarone  400 mg Oral BID   apixaban  5 mg Oral BID   arformoterol  15 mcg Nebulization Q12H   budesonide (PULMICORT) nebulizer solution  0.5 mg Nebulization BID   Chlorhexidine Gluconate Cloth  6 each Topical Q0600   clopidogrel  75 mg Oral Daily   doxercalciferol  3 mcg Intravenous Q T,Th,Sa-HD   feeding supplement (NEPRO CARB STEADY)  237 mL Oral BID BM   insulin aspart  0-20 Units Subcutaneous TID WC   insulin aspart  7 Units Subcutaneous TID WC   insulin glargine-yfgn  35 Units Subcutaneous Daily   levothyroxine  175 mcg Oral Q0600   melatonin  3 mg Oral QHS   metoprolol tartrate  37.5 mg Oral BID   midodrine  15 mg Oral Q8H   multivitamin  1 tablet Oral QHS   pantoprazole  40 mg Oral Q1200  polyethylene glycol  17 g Oral Daily   revefenacin  175 mcg Nebulization Daily   rosuvastatin  20 mg Oral Daily   senna-docusate  2 tablet Oral QHS   sevelamer carbonate  1,600 mg Oral TID WC   sodium zirconium cyclosilicate  10 g Oral Daily    albumin human     anticoagulant sodium citrate     acetaminophen, alteplase, anticoagulant sodium citrate, feeding supplement (NEPRO CARB STEADY), heparin, [START ON 07/21/2023] heparin, ipratropium-albuterol, lidocaine (PF), lidocaine-prilocaine, metoprolol tartrate, ondansetron (ZOFRAN) IV, mouth rinse, pentafluoroprop-tetrafluoroeth, polyethylene glycol, white petrolatum

## 2023-07-21 NOTE — Discharge Planning (Signed)
[facility] Patient Discharge Orders- [facility] CLINIC: [facility]  Patient's name: [name] [name] Admit/DC Dates: 07/11/2023 - 07/20/2023  Discharge Diagnoses: Acute on chronic hypoxemic hypercarbic respiratory failure d/t combo COPD and volume overload. On 3L home O2   Septic shock - cx's (-) and completed 7-day ABXs Hypotension - Noted coreg stopped and on Metoprolol 37.5mg  BID and Midodrne 15mg  TID?-may need to adjust these and make Midodrine on HD days. See how she does!  Aranesp: Given: No    Last Hgb: 11.2 PRBC's Given: No  ESA dose for discharge: Hold ESA for now. Resume per protocol if Hgb drops below 10. IV Iron dose at discharge: N/A  Heparin change: No  EDW Change: Yes New EDW: Lower to 98kg  Bath Change: No  Access intervention/Change: No  Hectorol change: Yes-lower to  Discharge Labs: Calcium 9.9 Phosphorus 7.5 Albumin 3.7 K+ 5.8  IV Antibiotics: No   On Coumadin?: No, on Eliquis 5mg  BID   OTHER/APPTS/LAB ORDERS:  Re-check K+ level at next HD on 07/23/23! Notify renal team if K+ level is high. Noted patient is a DNI per dc summary, ensure this is discussed with the patient when she returns back to [facility]!    D/C Meds to be reconciled by nurse after every discharge.  Completed By: Salome Holmes, NP   Reviewed by: MD:______ RN_______

## 2023-07-21 NOTE — TOC Transition Note (Signed)
Transition of Care - Initial Contact from Inpatient Facility  Date of discharge: 07/20/23 Date of contact: 07/21/23  Method: Attempted Phone Call Spoke to: No Answer  Attempted to contact patient to discuss transition of care from recent inpatient hospitalization but she didn't answer the phone. A voicemail was left to contact [facility] HD unit [phone] or contact [facility] for any questions or concerns.  Patient will return to her outpatient HD unit on: 07/23/23 at [facility].  Salome Holmes, NP

## 2023-07-22 NOTE — Progress Notes (Signed)
Late Note Entry- July 22, 2023  Pt was d/c on Saturday. Contacted [facility] this morning to advise clinic of pt's d/c date and that pt should resume care tomorrow.   Olivia Canter Renal Navigator [phone]

## 2023-07-26 ENCOUNTER — Telehealth: Payer: Self-pay | Admitting: Internal Medicine

## 2023-07-26 NOTE — Telephone Encounter (Signed)
Scheduled per 10/16 scheduling message, patient has been called and notified.

## 2023-07-30 ENCOUNTER — Emergency Department (HOSPITAL_COMMUNITY)
Admission: EM | Admit: 2023-07-30 | Discharge: 2023-07-30 | Disposition: A | Discharge status: Home / Self Care | Payer: No Typology Code available for payment source | Attending: Emergency Medicine | Admitting: Emergency Medicine

## 2023-07-30 ENCOUNTER — Other Ambulatory Visit: Payer: Self-pay

## 2023-07-30 DIAGNOSIS — Z7901 Long term (current) use of anticoagulants: Secondary | ICD-10-CM | POA: Insufficient documentation

## 2023-07-30 DIAGNOSIS — R040 Epistaxis: Secondary | ICD-10-CM | POA: Insufficient documentation

## 2023-07-30 LAB — CBC
HCT: 34.4 % — ABNORMAL LOW (ref 36.0–46.0)
Hemoglobin: 9.9 g/dL — ABNORMAL LOW (ref 12.0–15.0)
MCH: 30.7 pg (ref 26.0–34.0)
MCHC: 28.8 g/dL — ABNORMAL LOW (ref 30.0–36.0)
MCV: 106.8 fL — ABNORMAL HIGH (ref 80.0–100.0)
Platelets: 177 10*3/uL (ref 150–400)
RBC: 3.22 MIL/uL — ABNORMAL LOW (ref 3.87–5.11)
RDW: 14.6 % (ref 11.5–15.5)
WBC: 10.7 10*3/uL — ABNORMAL HIGH (ref 4.0–10.5)
nRBC: 0 % (ref 0.0–0.2)

## 2023-07-30 MED ORDER — AYR SALINE NASAL NA GEL
1.0000 | Freq: Once | NASAL | Status: DC
  Filled 2023-07-30: qty 14.1

## 2023-07-30 NOTE — Discharge Instructions (Addendum)
You were seen in the ER today for nosebleeds. You were not actively bleeding when you arrived and your physical exam was reassuring. I suspect that the bleeding is from recently increasing your nasal cannula oxygen from 2L to 3L without use of a humidifier. Please make sure you are using your humidifier with your oxygen to reduce the nasal irritation you are experiencing. If you do not have an oxygen humidifier, reach out to your primary care provider to have this prescribed for you.  I have given you a tube of [place] nasal saline gel to help with the nosebleeds that you have been experiencing.  This can help to moisturize the nasal septum which is the area that has been bleeding.  Please use this at least twice daily for the next few weeks to try to encourage wound healing and protect the area from recurrence of bleeding from the dry air.

## 2023-07-30 NOTE — ED Provider Notes (Signed)
[place] EMERGENCY DEPARTMENT AT [facility] Provider Note   CSN: [id number] Arrival date & time: 07/30/23  8295     History Chief Complaint  Patient presents with   Epistaxis    [name] [name] is a 72 y.o. female.  Patient presents emergency department concerns of nosebleeds.  States that he is been having off-and-on nosebleeds for the last 3 days.  Patient is on Eliquis but she also has had a change recently from baseline oxygen level from 2 L to 3 L nasal cannula.  States that she noted some increased bleeding at site of the increase in oxygen level.  States that earlier today, she was blowing her nose this morning when she began to have a nosebleed.  States on Sunday she had a nosebleed from about 7 AM to 10 AM.  Not actively bleeding at this time.  Denies any weakness, dizziness, or lightheadedness.   Epistaxis      Home Medications Prior to Admission medications   Medication Sig Start Date End Date Taking? Authorizing Provider  acetaminophen (TYLENOL) 500 MG tablet Take 1 tablet (500 mg total) by mouth See admin instructions. Take 1,000mg  (2 tablets) by mouth twice a day in addition to 1,000mg  (2 tablets) twice daily as needed for pain. 05/14/23   Ghimire, Werner Lean, MD  albuterol (PROVENTIL) (2.5 MG/3ML) 0.083% nebulizer solution Take 3 mLs (2.5 mg total) by nebulization every 6 (six) hours as needed for wheezing or shortness of breath. 05/14/23   Ghimire, Werner Lean, MD  albuterol (VENTOLIN HFA) 108 (90 Base) MCG/ACT inhaler Inhale 2 puffs into the lungs every 6 (six) hours as needed for shortness of breath (COPD). 05/14/23   Ghimire, Werner Lean, MD  allopurinol (ZYLOPRIM) 100 MG tablet Take 1 tablet (100 mg total) by mouth daily. 05/14/23   Ghimire, Werner Lean, MD  amiodarone (PACERONE) 200 MG tablet Take 2 tablets (400 mg total) by mouth 2 (two) times daily for 7 days, THEN 2 tablets (400 mg total) daily for 7 days, THEN 1 tablet (200 mg total) daily. 07/20/23 09/20/23   GhimireWerner Lean, MD  apixaban (ELIQUIS) 5 MG TABS tablet Take 1 tablet (5 mg total) by mouth 2 (two) times daily. 07/20/23   Ghimire, Werner Lean, MD  budesonide-formoterol (SYMBICORT) 160-4.5 MCG/ACT inhaler Inhale 2 puffs into the lungs in the morning and at bedtime. 05/14/23   Ghimire, Werner Lean, MD  carboxymethylcellulose (REFRESH PLUS) 0.5 % SOLN Place 1 drop into both eyes 3 (three) times daily as needed (dry/irritated eyes.).    [provider]  diclofenac Sodium (VOLTAREN) 1 % GEL Apply 2 g topically 4 (four) times daily. 04/17/23   Joseph Art, DO  docusate sodium (COLACE) 100 MG capsule Take 1 capsule (100 mg total) by mouth daily. 05/14/23   Ghimire, Werner Lean, MD  fluticasone (FLONASE) 50 MCG/ACT nasal spray Place 1 spray into both nostrils daily. 05/14/23   Ghimire, Werner Lean, MD  insulin lispro (HUMALOG KWIKPEN) 100 UNIT/ML KwikPen 0-15 Units, Subcutaneous, 3 times daily with meal CBG < 70: Implement Hypoglycemia meausers CBG 70 - 120: 0 units CBG 121 - 150: 2 units CBG 151 - 200: 3 units CBG 201 - 250: 5 units CBG 251 - 300: 8 units CBG 301 - 350: 11 units CBG 351 - 400: 15 units CBG > 400: call MD Patient taking differently: Inject 0-15 Units into the skin 3 (three) times daily. 0-15 Units, Subcutaneous, 3 times daily with meal CBG < 70:  Implement Hypoglycemia meausers CBG 70 - 120: 0 units CBG 121 - 150: 2 units CBG 151 - 200: 3 units CBG 201 - 250: 5 units CBG 251 - 300: 8 units CBG 301 - 350: 11 units CBG 351 - 400: 15 units CBG > 400: call MD 05/14/23   Maretta Bees, MD  levothyroxine (SYNTHROID) 175 MCG tablet Take 1 tablet (175 mcg total) by mouth daily. 05/14/23   Ghimire, Werner Lean, MD  lidocaine 4 % Place 2 patches onto the skin daily. Each lower side of back. 05/14/23   Ghimire, Werner Lean, MD  lidocaine-prilocaine (EMLA) cream Apply 1 Application topically See admin instructions. On dialysis days (Tues,Thurs,Sat) 02/13/22   [provider]  meclizine (ANTIVERT)  25 MG tablet Take 25 mg by mouth daily as needed. 06/12/23   [provider]  Methoxy PEG-Epoetin Beta (MIRCERA IJ) Inject 1 Syringe into the skin every 14 (fourteen) days. 06/01/23 05/30/24  [provider]  metoprolol tartrate (LOPRESSOR) 25 MG tablet Take 1.5 tablets (37.5 mg total) by mouth 2 (two) times daily. 07/20/23   Ghimire, Werner Lean, MD  midodrine (PROAMATINE) 5 MG tablet Take 3 tablets (15 mg total) by mouth every 8 (eight) hours. 07/20/23   Ghimire, Werner Lean, MD  Multiple Vitamins-Iron (MULTIVITAMINS WITH IRON) TABS tablet Take 1 tablet by mouth in the morning. 05/14/23   Ghimire, Werner Lean, MD  nitroGLYCERIN (NITROSTAT) 0.4 MG SL tablet Place 1 tablet (0.4 mg total) under the tongue every 5 (five) minutes as needed for chest pain. 07/06/23 07/05/24  Cyndi Bender, NP  Nutritional Supplements (FEEDING SUPPLEMENT, NEPRO CARB STEADY,) LIQD Take 237 mLs by mouth 2 (two) times daily between meals. 04/17/23   Joseph Art, DO  ondansetron (ZOFRAN-ODT) 4 MG disintegrating tablet Take 2 mg by mouth every 6 (six) hours as needed for nausea or vomiting. 07/10/23   [provider]  OXYGEN Inhale into the lungs.    [provider]  pantoprazole (PROTONIX) 40 MG tablet Take 1 tablet (40 mg total) by mouth 2 (two) times daily before a meal. 05/14/23   Ghimire, Werner Lean, MD  pregabalin (LYRICA) 25 MG capsule Take 25 mg by mouth daily. 05/29/23   [provider]  rosuvastatin (CRESTOR) 20 MG tablet Take 1 tablet (20 mg total) by mouth daily. 07/06/23   Cyndi Bender, NP  sevelamer carbonate (RENVELA) 800 MG tablet Take 2 tablets (1,600 mg total) by mouth 3 (three) times daily with meals. 05/14/23   Ghimire, Werner Lean, MD  thiamine (VITAMIN B1) 100 MG tablet Take 1 tablet (100 mg total) by mouth in the morning. 05/14/23   Ghimire, Werner Lean, MD  umeclidinium bromide (INCRUSE ELLIPTA) 62.5 MCG/ACT AEPB Inhale 1 puff into the lungs daily. 05/14/23   Ghimire, Werner Lean, MD       Allergies    Nebivolol, Zestril [lisinopril], Ace inhibitors, and Morphine and codeine    Review of Systems   Review of Systems  HENT:  Positive for nosebleeds.   All other systems reviewed and are negative.   Physical Exam Updated Vital Signs BP (!) 105/53   Pulse 74   Temp 98.1 F (36.7 C) (Axillary)   Resp (!) 21   SpO2 97%  Physical Exam Vitals and nursing note reviewed.  Constitutional:      General: She is not in acute distress.    Appearance: She is well-developed.  HENT:     Head: Normocephalic and atraumatic.     Nose:  Nose normal. No congestion or rhinorrhea.     Right Nostril: No foreign body, epistaxis, septal hematoma or occlusion.     Left Nostril: No foreign body, epistaxis, septal hematoma or occlusion.     Comments: Left and right nares appear to have some dried blood in them. Eyes:     Conjunctiva/sclera: Conjunctivae normal.  Cardiovascular:     Rate and Rhythm: Normal rate and regular rhythm.     Heart sounds: No murmur heard. Pulmonary:     Effort: Pulmonary effort is normal. No respiratory distress.     Breath sounds: Normal breath sounds.  Abdominal:     Palpations: Abdomen is soft.     Tenderness: There is no abdominal tenderness.  Musculoskeletal:        General: No swelling.     Cervical back: Neck supple.  Skin:    General: Skin is warm and dry.     Capillary Refill: Capillary refill takes less than 2 seconds.  Neurological:     Mental Status: She is alert.  Psychiatric:        Mood and Affect: Mood normal.     ED Results / Procedures / Treatments   Labs (all labs ordered are listed, but only abnormal results are displayed) Labs Reviewed  CBC - Abnormal; Notable for the following components:      Result Value   WBC 10.7 (*)    RBC 3.22 (*)    Hemoglobin 9.9 (*)    HCT 34.4 (*)    MCV 106.8 (*)    MCHC 28.8 (*)    All other components within normal limits    EKG EKG Interpretation Date/Time:  Tuesday July 30 2023  06:38:41 EDT Ventricular Rate:  71 PR Interval:  192 QRS Duration:  154 QT Interval:  468 QTC Calculation: 509 R Axis:   -40  Text Interpretation: Sinus rhythm Nonspecific IVCD with LAD Left ventricular hypertrophy Anterior Q waves, possibly due to LVH Confirmed by Vonita Moss [phone]) on 07/30/2023 6:58:02 AM  Radiology No results found.  Procedures Procedures   Medications Ordered in ED Medications - No data to display   ED Course/ Medical Decision Making/ A&P                               Medical Decision Making Amount and/or Complexity of Data Reviewed Labs: ordered.  Risk OTC drugs.   This patient presents to the ED for concern of epistaxis.  Differential diagnosis includes coagulopathy, nasal trauma, nasal cannula oxygenation use, septal hematoma, allergies   Lab Tests:  I Ordered, and personally interpreted labs.  The pertinent results include: CBC with hemoglobin at 9.9 which is slightly decreased however unremarkable and no acute indication for transfusion or further trending as patient is not acutely bleeding   Medicines ordered and prescription drug management:  I ordered medication including saline gel for epistaxis, nasal dryness Reevaluation of the patient after these medicines showed that the patient improved I have reviewed the patients home medicines and have made adjustments as needed   Problem List / ED Course:  Patient presented to the emergency department concerns of nosebleeds.  Reports that she been having some nosebleeds over the last 3 days or so.  Recently had her oxygen level change from 2 L to 3 L via nasal cannula and has noted bleeding since then.  States that most recently she was blowing her nose and began to experience left-sided  nasal bleeding.  States that she is currently on Eliquis and has been taking his medication as prescribed.  Denies any feelings of weakness, faintness, or lightheadedness. On examination, patient appears  to have signs of recent bleeding from the left anterior nasal septum.  No evidence of posterior nasal bleeding.  Right nostril is unremarkable.  No acute signs of bleeding at this time.  Will check CBC to ensure there is no findings of acute blood loss anemia. CBC is rather stable although slightly decreased compared to most recent CBC.  Suspect likely due to nasal bleeding but no indication for blood transfusion or further monitoring as patient is not acutely bleeding at this time.  Saline gel applied to bilateral nasal passages for humidification of the area.  No indication for Afrin nasal spray or nasal packing as there is no acute bleeding at this time.  Encourage patient to follow-up with primary care provider discussed nasal cannula oxygen humidification as patient only has a room humidifier at this time.  Encourage patient to use the saline gel twice daily until being able to have this addressed by primary care provider.  Discussed return precautions with patient.  Patient discharged home in stable condition.  Final Clinical Impression(s) / ED Diagnoses Final diagnoses:  Anterior epistaxis    Rx / DC Orders ED Discharge Orders     None         Salomon Mast 08/01/23 2334    Rondel Baton, MD 08/03/23 [phone]

## 2023-07-30 NOTE — ED Triage Notes (Signed)
Pt bibgcems from home pt states that her nose has been bleeding x3 days. Pt was changed from 2l to 3l and since then nose has been bleeding.  Bp- 140/88 66 hr  Cbg 249 99% 3L

## 2023-08-04 NOTE — Progress Notes (Unsigned)
[facility] OFFICE PROGRESS NOTE  Vladimir Crofts, FNP [address] [place] [address]  DIAGNOSIS: Recurrent non-small cell lung cancer diagnosed and September 2023 and initially diagnosed as stage IA (T1c, N0, M0) non-small cell lung cancer, squamous cell carcinoma presented with left lower lobe lung mass diagnosed in April 2023   PRIOR THERAPY: 1) Status post curative radiotherapy to the left lower lobe lung mass under the care of Dr. Mitzi Hansen completed on April 06, 2022. 2) status SBRT to 2 left lower lobe nodules under the care of Dr. Mitzi Hansen completed September 26, 2022.  CURRENT THERAPY: Observation   INTERVAL HISTORY: BILLYE DIA 72 y.o. female returns to the clinic today for a follow up visit. The patient was last seen in the clinic by Dr. Arbutus Ped and myself on 06/19/23. She is currently on observation for her history of recurrent lung cancer.  She is status post radiation in June 2023. She then had SBRT due to left lower lobe pulmonary nodules under the care of Dr. Mitzi Hansen in December 2023. At that time of her last appointment, Dr. Arbutus Ped reviewed her CT angiogram performed during her hospitalization on 05/06/2023 which showed no evidence of PE, cardiomegaly and central pulmonary vascular congestion without overt edema, left pleural effusion with compressive left lower lobe atelectasis.  She then had a cardiac PET scan on 06/05/2023; however, she receives different tracer for cardiac exam versus PET scan for lung malignancy. The PET scan redemonstrated moderate left pleural effusion with near complete atelectasis of the left lower lobe which radiology felt could be posttreatment appearance of known malignancy and assessment of residual or recurrent disease was limited on nontailored exam.  They mention that a prevascular lymph node was unchanged compared to prior CT although significantly enlarged compared to 11/09/2022 worrisome for nodal metastatic disease although it  could be reactive.  Dr. Arbutus Ped did not feel there was significant amount of fluid and there was no obvious signs of recurrent disease. Dr. Arbutus Ped feels that this is more likely related to her heart failure and end-stage renal disease.  Therefore, Dr. Arbutus Ped recommends close monitoring with a restaging CT scan in 3 months.    She has several co-morbidities with  end-stage renal disease on HD (Tuesday-Thursday-Saturday), hypertension, diabetes, combined systolic and diastolic heart failure, and sleep apnea with CPAP intolerance.   She was hospitalized several times in the interval. During her early October 2024 admission for acute on chronic hypoxic/hypercarbic respiratory failure in the setting of fluid overload/COPD exacerbation and culture negative sepsis, she had a repeat CT scan showing progressive enlargement of the soft tissue in prevascular space-suspicious for metastatic disease. Unchanged left greater than right pleural effusion. New groundglass opacities left upper lobe.   She was seen sooner today to discuss next steps.    The hospital, she had an IV in her right forearm.  The patient states that this was painful during her hospitalization.  It is unclear what was in her IV.  She now has some skin darkening and erythema in this area.  She presented to the ER for nosebleed recently and was told that the area on her arm appeared consistent with a chemical burn.  The patient has not seen her PCP since being discharged from the hospital but has to reschedule an appointment.  Otherwise she is feeling fair today except tired.  She did have dialysis earlier today.  Denies any fever, chills, night sweats, or unexplained weight loss.  She  continues to have baseline dyspnea on exertion for which she is on 2 to 2.5 L of oxygen.  She sometimes has epistaxis from her supplemental oxygen.  She states she has a humidifier at home.  He denies any significant cough unless she has nosebleeding.  Denies any  hemoptysis or chest pain.  Denies any nausea, vomiting, or diarrhea.  She takes a stool softener for constipation.  She is here today for evaluation and for a more detailed discussion about her current condition and treatment options.  MEDICAL HISTORY: Past Medical History:  Diagnosis Date   Anemia    Arthritis    Asthma    Chronic diastolic (congestive) heart failure (HCC)    Chronic kidney disease    Dialysis T-TH-Sat   Chronic pain syndrome    Class 2 obesity due to excess calories with body mass index (BMI) of 36.0 to 36.9 in adult    COPD (chronic obstructive pulmonary disease) (HCC)    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   Full dentures    GERD (gastroesophageal reflux disease)    Gout    History of hiatal hernia    History of transfusion    Hx MRSA infection    abscess left groin   Hyperlipidemia    Hypertension    Hypothyroid    Intractable nausea and vomiting 02/15/2021   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HCC)    Peripheral vascular disease (HCC)    Sleep apnea    UNABLE TO TOLERATE C PAP   Stress incontinence    TIA (transient ischemic attack)     X2 NO RESIDUAL PROBLEMS   Wears glasses     ALLERGIES:  is allergic to nebivolol, zestril [lisinopril], ace inhibitors, and morphine and codeine.  MEDICATIONS:  Current Outpatient Medications  Medication Sig Dispense Refill   acetaminophen (TYLENOL) 500 MG tablet Take 1 tablet (500 mg total) by mouth See admin instructions. Take 1,000mg  (2 tablets) by mouth twice a day in addition to 1,000mg  (2 tablets) twice daily as needed for pain.     albuterol (PROVENTIL) (2.5 MG/3ML) 0.083% nebulizer solution Take 3 mLs (2.5 mg total) by nebulization every 6 (six) hours as needed for wheezing or shortness of breath. 90 mL 1   albuterol (VENTOLIN HFA) 108 (90 Base) MCG/ACT inhaler Inhale 2 puffs into the lungs every 6 (six) hours as needed for shortness of breath (COPD). 18 g 1   allopurinol  (ZYLOPRIM) 100 MG tablet Take 1 tablet (100 mg total) by mouth daily. 30 tablet 1   amiodarone (PACERONE) 200 MG tablet Take 2 tablets (400 mg total) by mouth 2 (two) times daily for 7 days, THEN 2 tablets (400 mg total) daily for 7 days, THEN 1 tablet (200 mg total) daily. 90 tablet 2   apixaban (ELIQUIS) 5 MG TABS tablet Take 1 tablet (5 mg total) by mouth 2 (two) times daily. 60 tablet 2   budesonide-formoterol (SYMBICORT) 160-4.5 MCG/ACT inhaler Inhale 2 puffs into the lungs in the morning and at bedtime. 10.2 g 12   carboxymethylcellulose (REFRESH PLUS) 0.5 % SOLN Place 1 drop into both eyes 3 (three) times daily as needed (dry/irritated eyes.).     diclofenac Sodium (VOLTAREN) 1 % GEL Apply 2 g topically 4 (four) times daily.     docusate sodium (COLACE) 100 MG capsule Take 1 capsule (100 mg total) by mouth daily. 100 capsule 0   fluticasone (FLONASE) 50 MCG/ACT nasal spray Place 1 spray  into both nostrils daily. 16 g 2   insulin lispro (HUMALOG KWIKPEN) 100 UNIT/ML KwikPen 0-15 Units, Subcutaneous, 3 times daily with meal CBG < 70: Implement Hypoglycemia meausers CBG 70 - 120: 0 units CBG 121 - 150: 2 units CBG 151 - 200: 3 units CBG 201 - 250: 5 units CBG 251 - 300: 8 units CBG 301 - 350: 11 units CBG 351 - 400: 15 units CBG > 400: call MD (Patient taking differently: Inject 0-15 Units into the skin 3 (three) times daily. 0-15 Units, Subcutaneous, 3 times daily with meal CBG < 70: Implement Hypoglycemia meausers CBG 70 - 120: 0 units CBG 121 - 150: 2 units CBG 151 - 200: 3 units CBG 201 - 250: 5 units CBG 251 - 300: 8 units CBG 301 - 350: 11 units CBG 351 - 400: 15 units CBG > 400: call MD) 15 mL 11   levothyroxine (SYNTHROID) 175 MCG tablet Take 1 tablet (175 mcg total) by mouth daily. 30 tablet 3   lidocaine 4 % Place 2 patches onto the skin daily. Each lower side of back. 60 patch 1   lidocaine-prilocaine (EMLA) cream Apply 1 Application topically See admin instructions. On dialysis days  (Tues,Thurs,Sat)     meclizine (ANTIVERT) 25 MG tablet Take 25 mg by mouth daily as needed.     Methoxy PEG-Epoetin Beta (MIRCERA IJ) Inject 1 Syringe into the skin every 14 (fourteen) days.     metoprolol tartrate (LOPRESSOR) 25 MG tablet Take 1.5 tablets (37.5 mg total) by mouth 2 (two) times daily. 90 tablet 1   midodrine (PROAMATINE) 5 MG tablet Take 3 tablets (15 mg total) by mouth every 8 (eight) hours. 270 tablet 1   Multiple Vitamins-Iron (MULTIVITAMINS WITH IRON) TABS tablet Take 1 tablet by mouth in the morning. 30 tablet 1   nitroGLYCERIN (NITROSTAT) 0.4 MG SL tablet Place 1 tablet (0.4 mg total) under the tongue every 5 (five) minutes as needed for chest pain. 20 tablet 2   Nutritional Supplements (FEEDING SUPPLEMENT, NEPRO CARB STEADY,) LIQD Take 237 mLs by mouth 2 (two) times daily between meals.  0   ondansetron (ZOFRAN-ODT) 4 MG disintegrating tablet Take 2 mg by mouth every 6 (six) hours as needed for nausea or vomiting.     OXYGEN Inhale into the lungs.     pantoprazole (PROTONIX) 40 MG tablet Take 1 tablet (40 mg total) by mouth 2 (two) times daily before a meal. 60 tablet 2   pregabalin (LYRICA) 25 MG capsule Take 25 mg by mouth daily.     rosuvastatin (CRESTOR) 20 MG tablet Take 1 tablet (20 mg total) by mouth daily. 30 tablet 2   sevelamer carbonate (RENVELA) 800 MG tablet Take 2 tablets (1,600 mg total) by mouth 3 (three) times daily with meals. 180 tablet 2   thiamine (VITAMIN B1) 100 MG tablet Take 1 tablet (100 mg total) by mouth in the morning. 30 tablet 2   umeclidinium bromide (INCRUSE ELLIPTA) 62.5 MCG/ACT AEPB Inhale 1 puff into the lungs daily. 30 each 3   No current facility-administered medications for this visit.    SURGICAL HISTORY:  Past Surgical History:  Procedure Laterality Date   ABDOMINAL HYSTERECTOMY     AV FISTULA PLACEMENT Left 12/01/2021   Procedure: INSERTION OF LEFT ARM ARTERIOVENOUS (AV) GORE-TEX GRAFT;  Surgeon: Nada Libman, MD;   Location: [facility] OR;  Service: Vascular;  Laterality: Left;   BACK SURGERY  2020   BASCILIC VEIN TRANSPOSITION Left 04/05/2020  Procedure: BASILIC VEIN TRANSPOSITION FIRST STAGE;  Surgeon: Chuck Hint, MD;  Location: [facility] OR;  Service: Vascular;  Laterality: Left;   BRONCHIAL BIOPSY  01/23/2022   Procedure: BRONCHIAL BIOPSIES;  Surgeon: Josephine Igo, DO;  Location: [facility] ENDOSCOPY;  Service: Pulmonary;;   BRONCHIAL BIOPSY  08/07/2022   Procedure: BRONCHIAL BIOPSIES;  Surgeon: Josephine Igo, DO;  Location: [facility] ENDOSCOPY;  Service: Pulmonary;;   BRONCHIAL BRUSHINGS  08/07/2022   Procedure: BRONCHIAL BRUSHINGS;  Surgeon: Josephine Igo, DO;  Location: [facility] ENDOS[facility];  Service: Pulmonary;;   BRONCHIAL NEEDLE ASPIRATION BIOPSY  01/23/2022   Procedure: BRONCHIAL NEEDLE ASPIRATION BIOPSIES;  Surgeon: Josephine Igo, DO;  Location: [facility] ENDOS[facility];  Service: Pulmonary;;   BRONCHIAL NEEDLE ASPIRATION BIOPSY  08/07/2022   Procedure: BRONCHIAL NEEDLE ASPIRATION BIOPSIES;  Surgeon: Josephine Igo, DO;  Location: [facility] ENDOS[facility];  Service: Pulmonary;;   COLON SURGERY  1995   DUE TO POLYP   DILATION AND CURETTAGE OF UTERUS     ENDOBRONCHIAL ULTRASOUND  01/23/2022   Procedure: ENDOBRONCHIAL ULTRASOUND;  Surgeon: Josephine Igo, DO;  Location: [facility]OS[facility];  Service: Pulmonary;;   ENTEROSCOPY N/A 07/10/2021   Procedure: ENTEROSCOPY;  Surgeon: Kerin Salen, MD;  Location: [facility] ENDOSCOPY;  Service: Gastroenterology;  Laterality: N/A;   ESOPHAGOGASTRODUODENOSCOPY N/A 01/25/2021   Procedure: ESOPHAGOGASTRODUODENOSCOPY (EGD);  Surgeon: Vida Rigger, MD;  Location: [facility] ENDOSCOPY;  Service: Endoscopy;  Laterality: N/A;   FINE NEEDLE ASPIRATION  01/23/2022   Procedure: FINE NEEDLE ASPIRATION (FNA) LINEAR;  Surgeon: Josephine Igo, DO;  Location: [facility]OS[facility];  Service: Pulmonary;;   FOREARM FRACTURE SURGERY     Left arm   GIVENS CAPSULE STUDY N/A 07/07/2021   Procedure: GIVENS CAPSULE STUDY;  Surgeon: Vida Rigger,  MD;  Location: [facility] ENDOSCOPY;  Service: Endoscopy;  Laterality: N/A;   HERNIA REPAIR     w/ mesh   HOT HEMOSTASIS N/A 07/10/2021   Procedure: HOT HEMOSTASIS (ARGON PLASMA COAGULATION/BICAP);  Surgeon: Kerin Salen, MD;  Location: [facility] ENDOSCOPY;  Service: Gastroenterology;  Laterality: N/A;   INCISION AND DRAINAGE ABSCESS Left 02/04/2013   Procedure: INCISION AND DRAINAGE LEFT BUTTOCK ABSCESS; INCISION AND DRAINAGE LEFT BREAST ABSCESS;  Surgeon: Shelly Rubenstein, MD;  Location: [facility] OR;  Service: General;  Laterality: Left;   INCISION AND DRAINAGE ABSCESS N/A 02/12/2013   Procedure: INCISION AND DEBRIDEMENT BUTTOCK WOUND ;  Surgeon: Wilmon Arms. Corliss Skains, MD;  Location: [facility] OR;  Service: General;  Laterality: N/A;   INCISION AND DRAINAGE ABSCESS N/A 02/14/2013   Procedure: INCISION AND DRAINAGE/DRESSING CHANGE;  Surgeon: Shelly Rubenstein, MD;  Location: [facility] OR;  Service: General;  Laterality: N/A;   INCISION AND DRAINAGE ABSCESS N/A 03/21/2015   Procedure: INCISION AND DRAINAGE PUBIC ABSCESS;  Surgeon: Glenna Fellows, MD;  Location: [facility] ORS;  Service: General;  Laterality: N/A;   INCISION AND DRAINAGE PERIRECTAL ABSCESS Left 02/10/2013   Procedure: IRRIGATION AND DEBRIDEMENT OF BUTTOCK/PERINEAL ABSCESS;  Surgeon: Wilmon Arms. Corliss Skains, MD;  Location: [facility] OR;  Service: General;  Laterality: Left;   INCISION AND DRAINAGE PERIRECTAL ABSCESS N/A 02/16/2013   Procedure: IRRIGATION AND DEBRIDEMENT PERINEAL ABSCESS;  Surgeon: Liz Malady, MD;  Location: [facility] OR;  Service: General;  Laterality: N/A;   IR FLUORO GUIDE CV LINE RIGHT  11/30/2021   IR US GUIDE VASC ACCESS RIGHT  11/30/2021   IRRIGATION AND DEBRIDEMENT ABSCESS N/A 02/06/2013   Procedure: IRRIGATION AND DEBRIDEMENT BUTTOCK ABSCESS AND DRESSING CHANGE;  Surgeon: Shelly Rubenstein, MD;  [facility];  Service: General;  Laterality: N/A;   IRRIGATION AND DEBRIDEMENT ABSCESS Left 02/08/2013   Procedure: IRRIGATION AND DEBRIDEMENT ABSCESS/DRESSING CHANGE;  Surgeon:  Cherylynn Ridges, MD;  Location: [facility] OR;  Service: General;  Laterality: Left;   JOINT REPLACEMENT  2010 / 2012    LAPAROSCOPIC CHOLECYSTECTOMY     LEFT HEART CATH AND CORONARY ANGIOGRAPHY N/A 07/05/2023   Procedure: LEFT HEART CATH AND CORONARY ANGIOGRAPHY;  Surgeon: Lennette Bihari, MD;  [facility]: [facility] INVASIVE CV LAB;  Service: Cardiovascular;  Laterality: N/A;   LEFT HEART CATHETERIZATION WITH CORONARY ANGIOGRAM N/A 07/02/2014   Procedure: LEFT HEART CATHETERIZATION WITH CORONARY ANGIOGRAM;  Surgeon: Runell Gess, MD;  Location: [facility] CATH LAB;  Service: Cardiovascular;  Laterality: N/A;   LOWER EXTREMITY ANGIOGRAM Right 04/20/2016   Procedure: Lower Extremity Angiogram;  Surgeon: Sherren Kerns, MD;  Location: [facility] INVASIVE CV LAB;  Service: Cardiovascular;  Laterality: Right;   MULTIPLE TOOTH EXTRACTIONS     PERIPHERAL VASCULAR CATHETERIZATION N/A 04/20/2016   Procedure: Abdominal Aortogram;  Surgeon: Sherren Kerns, MD;  Location: [facility] INVASIVE CV LAB;  Service: Cardiovascular;  Laterality: N/A;   PERIPHERAL VASCULAR CATHETERIZATION Right 04/20/2016   Procedure: Peripheral Vascular Intervention;  Surgeon: Sherren Kerns, MD;  Location: [facility] INVASIVE CV LAB;  Service: Cardiovascular;  Laterality: Right;  popiteal   RIGHT HEART CATH N/A 10/27/2018   Procedure: RIGHT HEART CATH;  Surgeon: Laurey Morale, MD;  Location: [facility] INVASIVE CV LAB;  Service: Cardiovascular;  Laterality: N/A;   RIGHT HEART CATH N/A 03/20/2019   Procedure: RIGHT HEART CATH;  Surgeon: Laurey Morale, MD;  Location: [facility] INVASIVE CV LAB;  Service: Cardiovascular;  Laterality: N/A;   THYROIDECTOMY     TOTAL HIP REVISION Right 08/11/2014   Procedure: RIGHT ACETABULAR REVISION;  Surgeon: Loanne Drilling, MD;  Location: [facility] ORS;  Service: Orthopedics;  Laterality: Right;   VASCULAR SURGERY     VIDEO BRONCHOSCOPY WITH ENDOBRONCHIAL ULTRASOUND Bilateral 08/07/2022   Procedure: VIDEO BRONCHOSCOPY WITH ENDOBRONCHIAL ULTRASOUND;  Surgeon:  Josephine Igo, DO;  Location: [facility] ENDOSCOPY;  Service: Pulmonary;  Laterality: Bilateral;   VIDEO BRONCHOSCOPY WITH RADIAL ENDOBRONCHIAL ULTRASOUND  01/23/2022   Procedure: RADIAL ENDOBRONCHIAL ULTRASOUND;  Surgeon: Josephine Igo, DO;  Location: [facility] ENDOSCOPY;  Service: Pulmonary;;    REVIEW OF SYSTEMS:   Constitutional: Positive for fatigue and generalized weakness.  Negative for appetite change, chills, fever and unexpected weight change.  HENT:   Negative for mouth sores, nosebleeds, sore throat and trouble swallowing.   Eyes: Negative for eye problems and icterus.  Respiratory: Positive for shortness of breath on supplemental oxygen.  Negative for cough, hemoptysis, and wheezing.   Cardiovascular: Negative for chest pain.  Positive for mild bilateral leg swelling. Gastrointestinal: Positive for occasional constipation.  Negative for abdominal pain, diarrhea, nausea and vomiting.  Genitourinary: Negative for bladder incontinence, difficulty urinating, dysuria, frequency and hematuria.   Musculoskeletal: Positive for back pain and gait problem.  Negative for  gait  neck pain and neck stiffness.  Skin: See picture below for wound on right forearm.  Neurological: Positive for occasional dizziness if she moves her head a certain direction.  Negative for extremity weakness, gait problem, headaches, light-headedness and seizures.  Hematological: Negative for adenopathy. Does not bruise/bleed easily.  Psychiatric/Behavioral: Negative for confusion, depression and sleep disturbance. The patient is not nervous/anxious.    PHYSICAL EXAMINATION:  Blood pressure (!) 118/58, pulse 73, temperature 98.1 F (36.7 C), temperature source Oral, resp. rate 15,  SpO2 100%.  ECOG PERFORMANCE STATUS: 3  Physical Exam  Constitutional: Oriented to person, place, and time and chronically ill-appearing female and in no distress.  HENT:  Head: Normocephalic and atraumatic.  Mouth/Throat: Oropharynx is clear  and moist. No oropharyngeal exudate.  Eyes: Conjunctivae are normal. Right eye exhibits no discharge. Left eye exhibits no discharge. No scleral icterus.  Neck: Normal range of motion. Neck supple.  Cardiovascular: Normal rate, regular rhythm, normal heart sounds and intact distal pulses.   Pulmonary/Chest: Effort normal.  Crackles on the base of her lungs bilaterally.  Supplemental oxygen.  No respiratory distress. No wheezes. Abdominal: Soft. Bowel sounds are normal. Exhibits no distension and no mass. There is no tenderness.  Musculoskeletal: Normal range of motion. Exhibits mild bilateral lower extremity swelling. Skin:  Lymphadenopathy:    No cervical adenopathy.  Neurological: Alert and oriented to person, place, and time.  Muscle wasting.  The patient was examined in the wheelchair.  She does not bear weight.  Skin: Skin is warm and dry. No rash noted. Not diaphoretic. No erythema. No pallor.  Psychiatric: Mood, memory and judgment normal.  Vitals reviewed.  LABORATORY DATA: Lab Results  Component Value Date   WBC 5.8 08/06/2023   HGB 9.5 (L) 08/06/2023   HCT 30.8 (L) 08/06/2023   MCV 105.5 (H) 08/06/2023   PLT 202 08/06/2023      Chemistry      Component Value Date/Time   NA 140 08/06/2023 1357   NA 138 07/01/2023 1522   K 3.7 08/06/2023 1357   CL 97 (L) 08/06/2023 1357   CO2 33 (H) 08/06/2023 1357   BUN 27 (H) 08/06/2023 1357   BUN 95 (HH) 07/01/2023 1522   CREATININE 4.63 (H) 08/06/2023 1357   CREATININE 1.32 (H) 06/29/2014 1100      Component Value Date/Time   CALCIUM 9.1 08/06/2023 1357   CALCIUM 9.1 09/07/2021 1036   ALKPHOS 63 08/06/2023 1357   AST 14 (L) 08/06/2023 1357   ALT 8 08/06/2023 1357   BILITOT 0.3 08/06/2023 1357       RADIOGRAPHIC STUDIES:  DG CHEST PORT 1 VIEW  Result Date: 07/16/2023 CLINICAL DATA:  Shortness of breath EXAM: PORTABLE CHEST 1 VIEW COMPARISON:  07/12/2023 FINDINGS: Gross cardiomegaly. Diffuse bilateral interstitial  pulmonary opacity and retrocardiac atelectasis or consolidation. No acute osseous findings. IMPRESSION: Gross cardiomegaly with diffuse bilateral interstitial pulmonary opacity and retrocardiac atelectasis or consolidation. Findings are consistent with edema or infection. No focal airspace opacity. Electronically Signed   By: Jearld Lesch M.D.   On: 07/16/2023 11:27   DG Abd Portable 1V  Result Date: 07/12/2023 CLINICAL DATA:  Feeding tube placement EXAM: PORTABLE ABDOMEN - 1 VIEW COMPARISON:  11/27/2018 FINDINGS: The curvature of the feeding tube implies that its tip is in the fourth portion of the duodenum. Cardiomegaly noted with hazy opacity at the left lung base likely representing pleural effusion. Thoracic aortic atheromatous vascular calcification. IMPRESSION: 1. Feeding tube tip in the fourth portion of the duodenum. 2. Cardiomegaly with left pleural effusion. 3.  Aortic Atherosclerosis (ICD10-I70.0). Electronically Signed   By: Gaylyn Rong M.D.   On: 07/12/2023 15:57   DG CHEST PORT 1 VIEW  Result Date: 07/12/2023 CLINICAL DATA:  Pulmonary edema, altered mental status EXAM: PORTABLE CHEST 1 VIEW COMPARISON:  07/11/2023 FINDINGS: Cardiomegaly. Diffuse bilateral interstitial pulmonary opacity. Retrocardiac atelectasis or consolidation. No new or focal airspace opacity. Osseous structures unremarkable. IMPRESSION: 1. Cardiomegaly with diffuse bilateral interstitial pulmonary opacity, consistent with edema  or atypical/viral infection. 2. Retrocardiac atelectasis or consolidation. No new or focal airspace opacity. Electronically Signed   By: Jearld Lesch M.D.   On: 07/12/2023 15:46   CT Chest Wo Contrast  Result Date: 07/11/2023 CLINICAL DATA:  Respiratory illness, nondiagnostic x-ray. History of non-small cell lung cancer per previous imaging studies. * Tracking Code: BO * EXAM: CT CHEST WITHOUT CONTRAST TECHNIQUE: Multidetector CT imaging of the chest was performed following the standard  protocol without IV contrast. RADIATION DOSE REDUCTION: This exam was performed according to the departmental dose-optimization program which includes automated exposure control, adjustment of the mA and/or kV according to patient size and/or use of iterative reconstruction technique. COMPARISON:  Chest CT 05/06/2023 and 11/09/2022. Cardiac PET-CT 05/16/2023. FDG PET-CT 07/09/2022. Radiographs 07/11/2023. FINDINGS: Cardiovascular: Atherosclerosis of the aorta, great vessels and coronary arteries. Probable calcifications of the aortic valve. Stable moderate cardiomegaly without evidence of pericardial effusion. Mediastinum/Nodes: There is a well-circumscribed soft tissue nodule in the pre-vascular space which has enlarged from the previous chest CT, measuring 2.5 x 1.7 cm on image 43/3 (previously 1.8 x 1.1 cm).No other enlarged mediastinal, hilar or axillary lymph nodes are identified. Hilar assessment is limited by the lack of intravenous contrast and the basilar pulmonary opacities. The esophagus is mildly distended and fluid-filled, but unchanged. The thyroid gland appears unremarkable. Lungs/Pleura: Unchanged small left greater than right pleural effusions. Persistent complete left lower lobe collapse with occlusion of the left lung bronchi at the hilum. Bandlike opacities in the right lower lobe and associated volume loss have mildly increased from the prior CT of 2 months ago. There are new ground-glass opacities in the left upper lobe which are nonspecific and may represent edema or atypical inflammation. No focal pulmonary nodules are identified within the aerated portions of the lungs. Upper abdomen: No acute or significant findings are seen in the visualized upper abdomen. Bilateral renal cysts are grossly stable from previous PET-CT; no specific follow-up imaging recommended. Stable low-density enlargement of both adrenal glands consistent with adenomas or hyperplasia; no follow-up imaging recommended.  Musculoskeletal/Chest wall: No osseous metastases or acute fractures are identified. There are old rib fractures bilaterally. No evidence of chest wall mass. Unless specific follow-up recommendations are mentioned in the findings or impression sections, no imaging follow-up of any mentioned incidental findings is recommended. IMPRESSION: 1. Progressive enlargement of a soft tissue nodule in the pre-vascular space, suspicious for metastatic disease. 2. Unchanged small left greater than right pleural effusions with persistent complete left lower lobe collapse and occlusion of the left lung bronchi at the hilum.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

## 2023-08-06 ENCOUNTER — Inpatient Hospital Stay: Payer: 59 | Attending: Physician Assistant

## 2023-08-06 ENCOUNTER — Inpatient Hospital Stay (HOSPITAL_BASED_OUTPATIENT_CLINIC_OR_DEPARTMENT_OTHER): Payer: 59 | Admitting: Physician Assistant

## 2023-08-06 VITALS — BP 118/58 | HR 73 | Temp 98.1°F | Resp 15

## 2023-08-06 DIAGNOSIS — E1122 Type 2 diabetes mellitus with diabetic chronic kidney disease: Secondary | ICD-10-CM | POA: Insufficient documentation

## 2023-08-06 DIAGNOSIS — E785 Hyperlipidemia, unspecified: Secondary | ICD-10-CM | POA: Diagnosis not present

## 2023-08-06 DIAGNOSIS — J90 Pleural effusion, not elsewhere classified: Secondary | ICD-10-CM | POA: Insufficient documentation

## 2023-08-06 DIAGNOSIS — N186 End stage renal disease: Secondary | ICD-10-CM | POA: Insufficient documentation

## 2023-08-06 DIAGNOSIS — Z923 Personal history of irradiation: Secondary | ICD-10-CM | POA: Diagnosis not present

## 2023-08-06 DIAGNOSIS — K219 Gastro-esophageal reflux disease without esophagitis: Secondary | ICD-10-CM | POA: Diagnosis not present

## 2023-08-06 DIAGNOSIS — E1151 Type 2 diabetes mellitus with diabetic peripheral angiopathy without gangrene: Secondary | ICD-10-CM | POA: Insufficient documentation

## 2023-08-06 DIAGNOSIS — I700 Atherosclerosis of aorta: Secondary | ICD-10-CM | POA: Insufficient documentation

## 2023-08-06 DIAGNOSIS — K5900 Constipation, unspecified: Secondary | ICD-10-CM | POA: Insufficient documentation

## 2023-08-06 DIAGNOSIS — Z8673 Personal history of transient ischemic attack (TIA), and cerebral infarction without residual deficits: Secondary | ICD-10-CM | POA: Diagnosis not present

## 2023-08-06 DIAGNOSIS — G894 Chronic pain syndrome: Secondary | ICD-10-CM | POA: Insufficient documentation

## 2023-08-06 DIAGNOSIS — Z7901 Long term (current) use of anticoagulants: Secondary | ICD-10-CM | POA: Diagnosis not present

## 2023-08-06 DIAGNOSIS — T2240XA Corrosion of unspecified degree of shoulder and upper limb, except wrist and hand, unspecified site, initial encounter: Secondary | ICD-10-CM | POA: Diagnosis not present

## 2023-08-06 DIAGNOSIS — I5040 Unspecified combined systolic (congestive) and diastolic (congestive) heart failure: Secondary | ICD-10-CM | POA: Diagnosis not present

## 2023-08-06 DIAGNOSIS — G4730 Sleep apnea, unspecified: Secondary | ICD-10-CM | POA: Insufficient documentation

## 2023-08-06 DIAGNOSIS — Z9049 Acquired absence of other specified parts of digestive tract: Secondary | ICD-10-CM | POA: Insufficient documentation

## 2023-08-06 DIAGNOSIS — Z8614 Personal history of Methicillin resistant Staphylococcus aureus infection: Secondary | ICD-10-CM | POA: Insufficient documentation

## 2023-08-06 DIAGNOSIS — C3490 Malignant neoplasm of unspecified part of unspecified bronchus or lung: Secondary | ICD-10-CM

## 2023-08-06 DIAGNOSIS — Z79899 Other long term (current) drug therapy: Secondary | ICD-10-CM | POA: Diagnosis not present

## 2023-08-06 DIAGNOSIS — I132 Hypertensive heart and chronic kidney disease with heart failure and with stage 5 chronic kidney disease, or end stage renal disease: Secondary | ICD-10-CM | POA: Diagnosis not present

## 2023-08-06 DIAGNOSIS — Z992 Dependence on renal dialysis: Secondary | ICD-10-CM | POA: Insufficient documentation

## 2023-08-06 DIAGNOSIS — Z794 Long term (current) use of insulin: Secondary | ICD-10-CM | POA: Insufficient documentation

## 2023-08-06 DIAGNOSIS — C3432 Malignant neoplasm of lower lobe, left bronchus or lung: Secondary | ICD-10-CM | POA: Diagnosis present

## 2023-08-06 DIAGNOSIS — E039 Hypothyroidism, unspecified: Secondary | ICD-10-CM | POA: Insufficient documentation

## 2023-08-06 LAB — CBC WITH DIFFERENTIAL (CANCER CENTER ONLY)
Abs Immature Granulocytes: 0.02 10*3/uL (ref 0.00–0.07)
Basophils Absolute: 0 10*3/uL (ref 0.0–0.1)
Basophils Relative: 0 %
Eosinophils Absolute: 0.1 10*3/uL (ref 0.0–0.5)
Eosinophils Relative: 1 %
HCT: 30.8 % — ABNORMAL LOW (ref 36.0–46.0)
Hemoglobin: 9.5 g/dL — ABNORMAL LOW (ref 12.0–15.0)
Immature Granulocytes: 0 %
Lymphocytes Relative: 13 %
Lymphs Abs: 0.8 10*3/uL (ref 0.7–4.0)
MCH: 32.5 pg (ref 26.0–34.0)
MCHC: 30.8 g/dL (ref 30.0–36.0)
MCV: 105.5 fL — ABNORMAL HIGH (ref 80.0–100.0)
Monocytes Absolute: 0.4 10*3/uL (ref 0.1–1.0)
Monocytes Relative: 8 %
Neutro Abs: 4.5 10*3/uL (ref 1.7–7.7)
Neutrophils Relative %: 78 %
Platelet Count: 202 10*3/uL (ref 150–400)
RBC: 2.92 MIL/uL — ABNORMAL LOW (ref 3.87–5.11)
RDW: 14.6 % (ref 11.5–15.5)
WBC Count: 5.8 10*3/uL (ref 4.0–10.5)
nRBC: 0 % (ref 0.0–0.2)

## 2023-08-06 LAB — CMP (CANCER CENTER ONLY)
ALT: 8 U/L (ref 0–44)
AST: 14 U/L — ABNORMAL LOW (ref 15–41)
Albumin: 3.8 g/dL (ref 3.5–5.0)
Alkaline Phosphatase: 63 U/L (ref 38–126)
Anion gap: 10 (ref 5–15)
BUN: 27 mg/dL — ABNORMAL HIGH (ref 8–23)
CO2: 33 mmol/L — ABNORMAL HIGH (ref 22–32)
Calcium: 9.1 mg/dL (ref 8.9–10.3)
Chloride: 97 mmol/L — ABNORMAL LOW (ref 98–111)
Creatinine: 4.63 mg/dL — ABNORMAL HIGH (ref 0.44–1.00)
GFR, Estimated: 10 mL/min — ABNORMAL LOW (ref >60)
Glucose, Bld: 208 mg/dL — ABNORMAL HIGH (ref 70–99)
Potassium: 3.7 mmol/L (ref 3.5–5.1)
Sodium: 140 mmol/L (ref 135–145)
Total Bilirubin: 0.3 mg/dL (ref 0.3–1.2)
Total Protein: 6.9 g/dL (ref 6.5–8.1)

## 2023-08-07 ENCOUNTER — Ambulatory Visit: Payer: 59 | Admitting: General Practice

## 2023-08-14 ENCOUNTER — Ambulatory Visit: Payer: 59 | Admitting: Cardiovascular Disease

## 2023-08-19 ENCOUNTER — Encounter: Payer: Self-pay | Admitting: Nurse Practitioner

## 2023-08-19 ENCOUNTER — Ambulatory Visit: Payer: No Typology Code available for payment source | Attending: Nurse Practitioner | Admitting: Nurse Practitioner

## 2023-08-19 VITALS — BP 112/50 | HR 77 | Ht 63.0 in | Wt 220.0 lb

## 2023-08-19 DIAGNOSIS — Z794 Long term (current) use of insulin: Secondary | ICD-10-CM

## 2023-08-19 DIAGNOSIS — G4733 Obstructive sleep apnea (adult) (pediatric): Secondary | ICD-10-CM

## 2023-08-19 DIAGNOSIS — I350 Nonrheumatic aortic (valve) stenosis: Secondary | ICD-10-CM | POA: Diagnosis not present

## 2023-08-19 DIAGNOSIS — J42 Unspecified chronic bronchitis: Secondary | ICD-10-CM

## 2023-08-19 DIAGNOSIS — I739 Peripheral vascular disease, unspecified: Secondary | ICD-10-CM

## 2023-08-19 DIAGNOSIS — I480 Paroxysmal atrial fibrillation: Secondary | ICD-10-CM | POA: Diagnosis not present

## 2023-08-19 DIAGNOSIS — N186 End stage renal disease: Secondary | ICD-10-CM

## 2023-08-19 DIAGNOSIS — I10 Essential (primary) hypertension: Secondary | ICD-10-CM

## 2023-08-19 DIAGNOSIS — I2510 Atherosclerotic heart disease of native coronary artery without angina pectoris: Secondary | ICD-10-CM

## 2023-08-19 DIAGNOSIS — E039 Hypothyroidism, unspecified: Secondary | ICD-10-CM

## 2023-08-19 DIAGNOSIS — I5042 Chronic combined systolic (congestive) and diastolic (congestive) heart failure: Secondary | ICD-10-CM | POA: Diagnosis not present

## 2023-08-19 DIAGNOSIS — C3432 Malignant neoplasm of lower lobe, left bronchus or lung: Secondary | ICD-10-CM

## 2023-08-19 DIAGNOSIS — Z8673 Personal history of transient ischemic attack (TIA), and cerebral infarction without residual deficits: Secondary | ICD-10-CM

## 2023-08-19 DIAGNOSIS — E1122 Type 2 diabetes mellitus with diabetic chronic kidney disease: Secondary | ICD-10-CM

## 2023-08-19 DIAGNOSIS — Z992 Dependence on renal dialysis: Secondary | ICD-10-CM

## 2023-08-19 DIAGNOSIS — E785 Hyperlipidemia, unspecified: Secondary | ICD-10-CM

## 2023-08-19 NOTE — Patient Instructions (Signed)
Medication Instructions:  Your physician recommends that you continue on your current medications as directed. Please refer to the Current Medication list given to you today.  *If you need a refill on your cardiac medications before your next appointment, please call your pharmacy*   Lab Work: Fasting Lipid panel & LFTs at your convenience.    Testing/Procedures: NONE ordered at this time of appointment     Follow-Up: At [facility], you and your health needs are our priority.  As part of our continuing mission to provide you with exceptional heart care, we have created designated Provider Care Teams.  These Care Teams include your primary Cardiologist (physician) and Advanced Practice Providers (APPs -  Physician Assistants and Nurse Practitioners) who all work together to provide you with the care you need, when you need it.  We recommend signing up for the patient portal called "MyChart".  Sign up information is provided on this After Visit Summary.  MyChart is used to connect with patients for Virtual Visits (Telemedicine).  Patients are able to view lab/test results, encounter notes, upcoming appointments, etc.  Non-urgent messages can be sent to your provider as well.   To learn more about what you can do with MyChart, go to [email or web address].    Your next appointment:    Next available MD Only    Provider:   Nanetta Batty, MD

## 2023-08-19 NOTE — Progress Notes (Signed)
Office Visit    Patient Name: [name] [name] Date of Encounter: 08/19/2023  Primary Care Provider:  Vladimir Crofts, FNP Primary Cardiologist:  Nanetta Batty, MD  Chief Complaint    72 year old female with a history of nonobstructive CAD, chronic combined systolic and diastolic heart failure, NICM, paroxysmal atrial fibrillation, PSVT, aortic stenosis, hypertension, hyperlipidemia, type 2 diabetes, ESRD on HD TTS, hypothyroidism, PVD, prior TIA, anemia, OSA, COPD, non-small cell lung cancer s/p curative radiation and obesity who presents for follow-up related to CAD, heart failure, and atrial fibrillation.  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   Full dentures    GERD (gastroesophageal reflux disease)    Gout    History of hiatal hernia    History of transfusion    Hx MRSA infection    abscess left groin   Hyperlipidemia    Hypertension    Hypothyroid    Intractable nausea and vomiting 02/15/2021   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HCC)    Peripheral vascular disease (HCC)    Sleep apnea    UNABLE TO TOLERATE C PAP   Stress incontinence    TIA (transient ischemic attack)     X2 NO RESIDUAL PROBLEMS   Wears glasses    Past Surgical History:  Procedure Laterality Date   ABDOMINAL HYSTERECTOMY     AV FISTULA PLACEMENT Left 12/01/2021   Procedure: INSERTION OF LEFT ARM ARTERIOVENOUS (AV) GORE-TEX GRAFT;  Surgeon: Nada Libman, MD;  Location: [facility] OR;  Service: Vascular;  Laterality: Left;   BACK SURGERY  2020   BASCILIC VEIN TRANSPOSITION Left 04/05/2020   Procedure:  BASILIC VEIN TRANSPOSITION FIRST STAGE;  Surgeon: Chuck Hint, MD;  Location: [facility] OR;  Service: Vascular;  Laterality: Left;   BRONCHIAL BIOPSY  01/23/2022   Procedure: BRONCHIAL BIOPSIES;  Surgeon: Josephine Igo, DO;  Location: [facility] ENDOSCOPY;  Service: Pulmonary;;   BRONCHIAL BIOPSY  08/07/2022   Procedure: BRONCHIAL BIOPSIES;  Surgeon: Josephine Igo, DO;  Location: [facility] ENDOSCOPY;  Service: Pulmonary;;   BRONCHIAL BRUSHINGS  08/07/2022   Procedure: BRONCHIAL BRUSHINGS;  Surgeon: Josephine Igo, DO;  [facility]: [facility];  Service: Pulmonary;;   BRONCHIAL NEEDLE ASPIRATION BIOPSY  01/23/2022   Procedure: BRONCHIAL NEEDLE ASPIRATION BIOPSIES;  Surgeon: Josephine Igo, DO;  Location: [facility]OS[facility];  Service: Pulmonary;;   BRONCHIAL NEEDLE ASPIRATION BIOPSY  08/07/2022   Procedure: BRONCHIAL NEEDLE ASPIRATION BIOPSIES;  Surgeon: Josephine Igo, DO;  Location: [facility]OS[facility];  Service: Pulmonary;;   COLON SURGERY  1995   DUE TO POLYP   DILATION AND CURETTAGE OF UTERUS     ENDOBRONCHIAL ULTRASOUND  01/23/2022   Procedure: ENDOBRONCHIAL ULTRASOUND;  Surgeon: Josephine Igo, DO;  Location: [facility];  Service: Pulmonary;;   ENTEROSCOPY N/A 07/10/2021   Procedure: ENTEROSCOPY;  Surgeon: Kerin Salen, MD;  Location: [facility] ENDOSCOPY;  Service: Gastroenterology;  Laterality: N/A;   ESOPHAGOGASTRODUODENOSCOPY N/A 01/25/2021   Procedure: ESOPHAGOGASTRODUODENOSCOPY (EGD);  Surgeon: Vida Rigger, MD;  Location: [facility]  ENDOSCOPY;  Service: Endoscopy;  Laterality: N/A;   FINE NEEDLE ASPIRATION  01/23/2022   Procedure: FINE NEEDLE ASPIRATION (FNA) LINEAR;  Surgeon: Josephine Igo, DO;  [facility]: [facility]OS[facility];  Service: Pulmonary;;   FOREARM FRACTURE SURGERY     Left arm   GIVENS CAPSULE STUDY N/A 07/07/2021   Procedure: GIVENS CAPSULE STUDY;  Surgeon: Vida Rigger, MD;  Location: [facility] ENDOSCOPY;  Service: Endoscopy;  Laterality: N/A;   HERNIA REPAIR     w/ mesh   HOT HEMOSTASIS N/A 07/10/2021    Procedure: HOT HEMOSTASIS (ARGON PLASMA COAGULATION/BICAP);  Surgeon: Kerin Salen, MD;  Location: [facility] ENDOSCOPY;  Service: Gastroenterology;  Laterality: N/A;   INCISION AND DRAINAGE ABSCESS Left 02/04/2013   Procedure: INCISION AND DRAINAGE LEFT BUTTOCK ABSCESS; INCISION AND DRAINAGE LEFT BREAST ABSCESS;  Surgeon: Shelly Rubenstein, MD;  Location: [facility] OR;  Service: General;  Laterality: Left;   INCISION AND DRAINAGE ABSCESS N/A 02/12/2013   Procedure: INCISION AND DEBRIDEMENT BUTTOCK WOUND ;  Surgeon: Wilmon Arms. Corliss Skains, MD;  Location: [facility] OR;  Service: General;  Laterality: N/A;   INCISION AND DRAINAGE ABSCESS N/A 02/14/2013   Procedure: INCISION AND DRAINAGE/DRESSING CHANGE;  Surgeon: Shelly Rubenstein, MD;  [facility]: [facility] OR;  Service: General;  Laterality: N/A;   INCISION AND DRAINAGE ABSCESS N/A 03/21/2015   Procedure: INCISION AND DRAINAGE PUBIC ABSCESS;  Surgeon: Glenna Fellows, MD;  Location: [facility] ORS;  Service: General;  Laterality: N/A;   INCISION AND DRAINAGE PERIRECTAL ABSCESS Left 02/10/2013   Procedure: IRRIGATION AND DEBRIDEMENT OF BUTTOCK/PERINEAL ABSCESS;  Surgeon: Wilmon Arms. Corliss Skains, MD;  Location: [facility] OR;  Service: General;  Laterality: Left;   INCISION AND DRAINAGE PERIRECTAL ABSCESS N/A 02/16/2013   Procedure: IRRIGATION AND DEBRIDEMENT PERINEAL ABSCESS;  Surgeon: Liz Malady, MD;  Location: [facility];  Service: General;  Laterality: N/A;   IR FLUORO GUIDE CV LINE RIGHT  11/30/2021   IR US GUIDE VASC ACCESS RIGHT  11/30/2021   IRRIGATION AND DEBRIDEMENT ABSCESS N/A 02/06/2013   Procedure: IRRIGATION AND DEBRIDEMENT BUTTOCK ABSCESS AND DRESSING CHANGE;  Surgeon: Shelly Rubenstein, MD;  [facility];  Service: General;  Laterality: N/A;   IRRIGATION AND DEBRIDEMENT ABSCESS Left 02/08/2013   Procedure: IRRIGATION AND DEBRIDEMENT ABSCESS/DRESSING CHANGE;  Surgeon: Cherylynn Ridges, MD;  Location: [facility];  Service: General;  Laterality: Left;   JOINT REPLACEMENT  2010 / 2012    LAPAROSCOPIC  CHOLECYSTECTOMY     LEFT HEART CATH AND CORONARY ANGIOGRAPHY N/A 07/05/2023   Procedure: LEFT HEART CATH AND CORONARY ANGIOGRAPHY;  Surgeon: Lennette Bihari, MD;  [facility] INVAS[facility] CV LAB;  Service: Cardiovascular;  Laterality: N/A;   LEFT HEART CATHETERIZATION WITH CORONARY ANGIOGRAM N/A 07/02/2014   Procedure: LEFT HEART CATHETERIZATION WITH CORONARY ANGIOGRAM;  Surgeon: Runell Gess, MD;  Location: [facility] CATH LAB;  Service: Cardiovascular;  Laterality: N/A;   LOWER EXTREMITY ANGIOGRAM Right 04/20/2016   Procedure: Lower Extremity Angiogram;  Surgeon: Sherren Kerns, MD;  Location: [facility] INVASIVE CV LAB;  Service: Cardiovascular;  Laterality: Right;   MULTIPLE TOOTH EXTRACTIONS     PERIPHERAL VASCULAR CATHETERIZATION N/A 04/20/2016   Procedure: Abdominal Aortogram;  Surgeon: Sherren Kerns, MD;  Location: [facility] INVASIVE CV LAB;  Service: Cardiovascular;  Laterality: N/A;   PERIPHERAL VASCULAR CATHETERIZATION Right 04/20/2016   Procedure: Peripheral Vascular Intervention;  Surgeon: Sherren Kerns, MD;  Location: [facility] INVASIVE CV LAB;  Service: Cardiovascular;  Laterality: Right;  popiteal   RIGHT HEART CATH N/A 10/27/2018  Procedure: RIGHT HEART CATH;  Surgeon: Laurey Morale, MD;  Location: [facility] INVASIVE CV LAB;  Service: Cardiovascular;  Laterality: N/A;   RIGHT HEART CATH N/A 03/20/2019   Procedure: RIGHT HEART CATH;  Surgeon: Laurey Morale, MD;  Location: [facility] INVASIVE CV LAB;  Service: Cardiovascular;  Laterality: N/A;   THYROIDECTOMY     TOTAL HIP REVISION Right 08/11/2014   Procedure: RIGHT ACETABULAR REVISION;  Surgeon: Loanne Drilling, MD;  Location: [facility] ORS;  Service: Orthopedics;  Laterality: Right;   VASCULAR SURGERY     VIDEO BRONCHOSCOPY WITH ENDOBRONCHIAL ULTRASOUND Bilateral 08/07/2022   Procedure: VIDEO BRONCHOSCOPY WITH ENDOBRONCHIAL ULTRASOUND;  Surgeon: Josephine Igo, DO;  Location: [facility] ENDOS[facility];  Service: Pulmonary;  Laterality: Bilateral;   VIDEO BRONCHOSCOPY WITH RADIAL  ENDOBRONCHIAL ULTRASOUND  01/23/2022   Procedure: RADIAL ENDOBRONCHIAL ULTRASOUND;  Surgeon: Josephine Igo, DO;  Location: [facility] ENDOSCOPY;  Service: Pulmonary;;    Allergies  Allergies  Allergen Reactions   Nebivolol Swelling    Chest pain (reaction to Bystolic)   Zestril [Lisinopril] Swelling and Other (See Comments)   Ace Inhibitors Swelling and Other (See Comments)    Tongue swell   Morphine And Codeine Itching     Labs/Other Studies Reviewed    The following studies were reviewed today:  Cardiac Studies & Procedures   CARDIAC CATHETERIZATION  CARDIAC CATHETERIZATION 07/05/2023  Narrative   Prox Cx to Mid Cx lesion is 40% stenosed.   Mid Cx lesion is 30% stenosed.   Dist LAD lesion is 10% stenosed.   Recommend Aspirin 81mg  daily for moderate CAD.  There is significant coronary calcification involving the LAD and left circumflex vessel.  Nonobstructive CAD with smooth 10% mid LAD narrowings and left circumflex calcification with 40% proximal stenosis and smooth 30% mid AV groove stenosis.  Normal nondominant RCA.  RECOMMENDATION: Medical therapy for mild nonobstructive CAD.  Guideline directed therapy with reduced LV function in this patient with end-stage renal disease.  Since the patient does not have anyone at home, she will stay overnight for observation.  Plans are for dialysis tomorrow and apparently it has been arranged that she would be picked up to leave the hospital in early a.m.and taken for her dialysis.  Findings Coronary Findings Diagnostic  Dominance: Right  Left Anterior Descending Dist LAD lesion is 10% stenosed.  Left Circumflex Vessel is small. Prox Cx to Mid Cx lesion is 40% stenosed. Mid Cx lesion is 30% stenosed.  Fourth Obtuse Marginal Branch Vessel is small in size.  Intervention  No interventions have been documented.   CARDIAC CATHETERIZATION  CARDIAC CATHETERIZATION 03/20/2019  Narrative Low filling pressures, no significant  CHF.  Findings Coronary Findings Diagnostic  Dominance: Right  No diagnostic findings have been documented. Intervention  No interventions have been documented.   STRESS TESTS  NM PET CT CARDIAC PERFUSION MULTI W/ABSOLUTE BLOODFLOW 06/05/2023  Narrative   Findings are consistent with a small area of infarction with peri-infarct ischemia in the inferior wall. This study is high risk. Consider cardiac catheterization given reduced myocardial blood flow reserve, severe coronary calcifications, reduced EF with no augmentation with stress, and perfusion defect.   LV perfusion is abnormal. Defect 1: There is a small defect with mild reduction in uptake present in the apical to mid inferior location(s) that is partially reversible. There is abnormal wall motion in the defect area. Consistent with infarction and peri-infarct ischemia.   Rest left ventricular function is abnormal. Rest global function is severely reduced. Rest EF:  25%. Stress left ventricular function is abnormal. Stress global function is severely reduced. Stress EF: 28%. Ejection fraction may be inaccurately gated in the setting frequent ectopy. End diastolic cavity size is severely enlarged. End systolic cavity size is severely enlarged. No evidence of transient ischemic dilation (TID) noted.   Myocardial blood flow was computed to be 0.55ml/g/min at rest and 1.61ml/g/min at stress. Global myocardial blood flow reserve was 1.54 and was abnormal. Challenging to accurately interpret MBFR in the setting of ESRD.   Coronary calcium was present on the attenuation correction CT images. Severe coronary calcifications were present. Coronary calcifications were present in the left anterior descending artery and left circumflex artery distribution(s).   Electronically signed by: Parke Poisson, MD  CLINICAL DATA:  This over-read does not include interpretation of cardiac or coronary anatomy or pathology. The Cardiac PET CT interpretation  by the cardiologist is attached. Patient with known left lower lobe lung cancer. * Tracking Code: BO *  COMPARISON:  CT chest angiogram, 05/06/2023, CT chest, 11/09/2022  FINDINGS: Cardiovascular: Aortic atherosclerosis. Cardiomegaly. Left coronary artery calcifications. No pericardial effusion.  Limited Mediastinum/Nodes: Prevascular lymph node unchanged compared to recent prior CT, although significantly enlarged compared to prior examination dated 11/09/2022 measuring 1.9 x 1.3 cm (series 3, image 9) trachea and esophagus demonstrate no significant findings.  Limited Lungs/Pleura: Moderate left pleural effusion and near complete atelectasis or consolidation of the left lower lobe, similar to recent prior examination although markedly increased compared to more remote examination dated 11/09/2022. Trace right pleural effusion. Unchanged, partially imaged fat herniation or pleural lipoma of the anterior right hemithorax, unchanged compared to prior examination dated 11/09/2022  Upper Abdomen: No acute abnormality.  Musculoskeletal: No acute osseous findings. Subacute to chronic, partially callused bilateral anterior rib fractures.  IMPRESSION: 1. Moderate left pleural effusion and near complete atelectasis or consolidation of the left lower lobe, similar to recent prior examination although markedly increased compared to more remote examination dated 11/09/2022. This may reflect post treatment appearance of known malignancy; assessment for residual or recurrent disease very limited on this non tailored examination. Attention on oncologic follow-up. Note that metabolic activity of lung malignancy is not meaningfully assessed by rubidium PET-CT. 2. Prevascular lymph node unchanged compared to recent prior CT, although significantly enlarged compared to prior examination dated 11/09/2022 this is worrisome for nodal metastatic disease although may be reactive. 3. Cardiomegaly and  coronary artery disease.  Aortic Atherosclerosis (ICD10-I70.0).   Electronically Signed By: Jearld Lesch M.D. On: 06/05/2023 15:12   ECHOCARDIOGRAM  ECHOCARDIOGRAM COMPLETE 04/15/2023  Narrative ECHOCARDIOGRAM REPORT    Patient Name:   [name] [name] Date of Exam: 04/15/2023 Medical Rec #:  [id number]         Height:       63.0 in Accession #:    [id number]        Weight:       226.6 lb Date of Birth:  May 11, 1951          BSA:          2.039 m Patient Age:    72 years          BP:           118/50 mmHg Patient Gender: F                 HR:           113 bpm. Exam Location:  Inpatient  Procedure: 2D Echo, Cardiac Doppler, Color Doppler and  Intracardiac Opacification Agent  Indications:    CHF-Acute Diastolic I50.31  History:        Patient has prior history of Echocardiogram examinations, most recent 11/22/2021. CHF, PAD, TIA and COPD, Signs/Symptoms:Chest Pain and Syncope; Risk Factors:Hypertension, Sleep Apnea, Diabetes, Dyslipidemia and Current Smoker. ESRD.  Sonographer:    Lucendia Herrlich Referring Phys: [id number] PRATIK D [facility]  IMPRESSIONS   1. Left ventricular ejection fraction, by estimation, is 35 to 40%. The left ventricle has moderately decreased function. The left ventricle demonstrates global hypokinesis. The left ventricular internal cavity size was mildly dilated. Left ventricular diastolic parameters are consistent with Grade I diastolic dysfunction (impaired relaxation). 2. Right ventricular systolic function is normal. The right ventricular size is normal. There is moderately elevated pulmonary artery systolic pressure. The estimated right ventricular systolic pressure is 47.3 mmHg. 3. Left atrial size was mildly dilated. 4. The mitral valve is normal in structure. Trivial mitral valve regurgitation. No evidence of mitral stenosis. 5. The aortic valve is tricuspid. Aortic valve regurgitation is not visualized. No aortic stenosis is present. Aortic valve  mean gradient measures 7.0 mmHg. 6. The inferior vena cava is dilated in size with <50% respiratory variability, suggesting right atrial pressure of 15 mmHg.  FINDINGS Left Ventricle: Left ventricular ejection fraction, by estimation, is 35 to 40%. The left ventricle has moderately decreased function. The left ventricle demonstrates global hypokinesis. Definity contrast agent was given IV to delineate the left ventricular endocardial borders. The left ventricular internal cavity size was mildly dilated. There is no left ventricular hypertrophy. Left ventricular diastolic parameters are consistent with Grade I diastolic dysfunction (impaired relaxation).  Right Ventricle: The right ventricular size is normal. No increase in right ventricular wall thickness. Right ventricular systolic function is normal. There is moderately elevated pulmonary artery systolic pressure. The tricuspid regurgitant velocity is 2.84 m/s, and with an assumed right atrial pressure of 15 mmHg, the estimated right ventricular systolic pressure is 47.3 mmHg.  Left Atrium: Left atrial size was mildly dilated.  Right Atrium: Right atrial size was normal in size.  Pericardium: There is no evidence of pericardial effusion.  Mitral Valve: The mitral valve is normal in structure. Trivial mitral valve regurgitation. No evidence of mitral valve stenosis.  Tricuspid Valve: The tricuspid valve is normal in structure. Tricuspid valve regurgitation is trivial.  Aortic Valve: The aortic valve is tricuspid. Aortic valve regurgitation is not visualized. No aortic stenosis is present. Aortic valve mean gradient measures 7.0 mmHg. Aortic valve peak gradient measures 13.4 mmHg. Aortic valve area, by VTI measures 2.52 cm.  Pulmonic Valve: The pulmonic valve was normal in structure. Pulmonic valve regurgitation is not visualized.  Aorta: The aortic root is normal in size and structure.  Venous: The inferior vena cava is dilated in size  with less than 50% respiratory variability, suggesting right atrial pressure of 15 mmHg.  IAS/Shunts: No atrial level shunt detected by color flow Doppler.   LEFT VENTRICLE PLAX 2D LVIDd:         5.90 cm   Diastology LVIDs:         5.00 cm   LV e' medial:    6.64 cm/s LV PW:         1.10 cm   LV E/e' medial:  13.4 LV IVS:        0.90 cm   LV e' lateral:   8.05 cm/s LVOT diam:     2.00 cm   LV E/e' lateral: 11.1 LV SV:  81 LV SV Index:   40 LVOT Area:     3.14 cm   RIGHT VENTRICLE             IVC RV S prime:     12.10 cm/s  IVC diam: 2.30 cm TAPSE (M-mode): 2.1 cm  LEFT ATRIUM           Index        RIGHT ATRIUM           Index LA diam:      4.90 cm 2.40 cm/m   RA Area:     13.50 cm LA Vol (A2C): 50.1 ml 24.57 ml/m  RA Volume:   31.40 ml  15.40 ml/m LA Vol (A4C): 78.0 ml 38.25 ml/m AORTIC VALVE AV Area (Vmax):    2.48 cm AV Area (Vmean):   2.45 cm AV Area (VTI):     2.52 cm AV Vmax:           183.00 cm/s AV Vmean:          125.000 cm/s AV VTI:            0.322 m AV Peak Grad:      13.4 mmHg AV Mean Grad:      7.0 mmHg LVOT Vmax:         144.67 cm/s LVOT Vmean:        97.333 cm/s LVOT VTI:          0.258 m LVOT/AV VTI ratio: 0.80  AORTA Ao Root diam: 3.00 cm Ao Asc diam:  3.10 cm  MITRAL VALVE                TRICUSPID VALVE MV Area (PHT): 4.49 cm     TR Peak grad:   32.3 mmHg MV Decel Time: 169 msec     TR Vmax:        284.00 cm/s MV E velocity: 89.30 cm/s MV A velocity: 108.00 cm/s  SHUNTS MV E/A ratio:  0.83         Systemic VTI:  0.26 m Systemic Diam: 2.00 cm  Dalton McleanMD Electronically signed by Wilfred Lacy Signature Date/Time: 04/15/2023/5:22:34 PM    Final            Recent Labs: 07/11/2023: B Natriuretic Peptide 903.7; TSH 4.064 07/17/2023: Magnesium 2.0 08/06/2023: ALT 8; BUN 27; Creatinine 4.63; Hemoglobin 9.5; Platelet Count 202; Potassium 3.7; Sodium 140  Recent Lipid Panel    Component Value Date/Time   CHOL 131  07/06/2023 0821   CHOL 188 12/23/2013 1132   TRIG 213 (H) 07/06/2023 0821   HDL 25 (L) 07/06/2023 0821   HDL 48 12/23/2013 1132   CHOLHDL 5.2 07/06/2023 0821   VLDL 43 (H) 07/06/2023 0821   LDLCALC 63 07/06/2023 0821   LDLCALC 119 (H) 12/23/2013 1132    History of Present Illness    72 year old female with the above past medical history including nonobstructive CAD, chronic combined systolic and diastolic heart failure, NICM, paroxysmal atrial fibrillation, PSVT, aortic stenosis, hypertension, hyperlipidemia, type 2 diabetes, ESRD on HD TTS, hypothyroidism, PVD, prior TIA, anemia, OSA, COPD, non-small cell lung cancer s/p curative radiation and obesity.  Prior cardiac catheterization in 2015 showed no significant coronary artery disease.  She has known peripheral arterial disease with prior R femoral artery stenting, follows with Dr. Darrick Penna of vascular surgery.  Echocardiogram in 11/2021 showed EF 40 to 45%, mild LVH, G1 DD, severe left atrial enlargement, mild aortic stenosis, mean gradient 8 mmHg.  She was hospitalized in July 2024 in the setting of chest pain.  Repeat echocardiogram showed EF 35 to 40%, no new wall motion abnormalities.  Outpatient cardiac PET stress test revealed partially reversible small defect in the apical to mid anterior location, consistent with infarction and peri-infarct ischemia, EF 25%.  She underwent outpatient cardiac catheterization 07/05/2023 which revealed mild nonobstructive CAD (proximal to mid LCx 40%, mid LCx 30%, distal LAD 10% stenosed).  She was started on Crestor.  Escalation of GDMT was limited in the setting of ESRD.  Additionally, she did not tolerate BiDil due to dizziness, previously declined amlodipine.  She was hospitalized in October 2024 in the setting of acute on chronic hypoxic/hypercarbic respiratory failure secondary to acute systolic heart failure, COPD exacerbation, culture-negative septic shock.  Cardiology was consulted in the setting of new  A-fib with RVR.  She was started on amiodarone and converted to NSR.  She was started on Eliquis.  CT imaging during her hospitalization showed concern for metastatic disease, follow-up with oncology was recommended.  Her follow-up visit with oncology she was noted to have a nonhealing ulcerative wound to her right forearm, felt to be the result of infiltrated IV during her recent hospitalization.  She was referred to wound care.  She was seen in the ED on 07/30/2023 in the setting of epistaxis.  She was advised that he medicated patient to her oxygen.  She presents today for follow-up. Since her recent hospitalization she has stable overall though she notes intermittent chest discomfort when she lays down and with significant stress, stable chronic dyspnea, she denies PND, orthopnea.  She reports intermittently low BP with dialysis. She denies any recent palpitations.   Home Medications    Current Outpatient Medications  Medication Sig Dispense Refill   acetaminophen (TYLENOL) 500 MG tablet Take 1 tablet (500 mg total) by mouth See admin instructions. Take 1,000mg  (2 tablets) by mouth twice a day in addition to 1,000mg  (2 tablets) twice daily as needed for pain.     albuterol (PROVENTIL) (2.5 MG/3ML) 0.083% nebulizer solution Take 3 mLs (2.5 mg total) by nebulization every 6 (six) hours as needed for wheezing or shortness of breath. 90 mL 1   albuterol (VENTOLIN HFA) 108 (90 Base) MCG/ACT inhaler Inhale 2 puffs into the lungs every 6 (six) hours as needed for shortness of breath (COPD). 18 g 1   allopurinol (ZYLOPRIM) 100 MG tablet Take 1 tablet (100 mg total) by mouth daily. 30 tablet 1   amiodarone (PACERONE) 200 MG tablet Take 2 tablets (400 mg total) by mouth 2 (two) times daily for 7 days, THEN 2 tablets (400 mg total) daily for 7 days, THEN 1 tablet (200 mg total) daily. 90 tablet 2   amiodarone (PACERONE) 200 MG tablet Take 200 mg by mouth daily.     apixaban (ELIQUIS) 5 MG TABS tablet Take 1  tablet (5 mg total) by mouth 2 (two) times daily. 60 tablet 2   budesonide-formoterol (SYMBICORT) 160-4.5 MCG/ACT inhaler Inhale 2 puffs into the lungs in the morning and at bedtime. 10.2 g 12   carboxymethylcellulose (REFRESH PLUS) 0.5 % SOLN Place 1 drop into both eyes 3 (three) times daily as needed (dry/irritated eyes.).     diclofenac Sodium (VOLTAREN) 1 % GEL Apply 2 g topically 4 (four) times daily.     docusate sodium (COLACE) 100 MG capsule Take 1 capsule (100 mg total) by mouth daily. 100 capsule 0   fluticasone (FLONASE) 50 MCG/ACT nasal spray Place  1 spray into both nostrils daily. 16 g 2   insulin lispro (HUMALOG KWIKPEN) 100 UNIT/ML KwikPen 0-15 Units, Subcutaneous, 3 times daily with meal CBG < 70: Implement Hypoglycemia meausers CBG 70 - 120: 0 units CBG 121 - 150: 2 units CBG 151 - 200: 3 units CBG 201 - 250: 5 units CBG 251 - 300: 8 units CBG 301 - 350: 11 units CBG 351 - 400: 15 units CBG > 400: call MD (Patient taking differently: Inject 0-15 Units into the skin 3 (three) times daily. 0-15 Units, Subcutaneous, 3 times daily with meal CBG < 70: Implement Hypoglycemia meausers CBG 70 - 120: 0 units CBG 121 - 150: 2 units CBG 151 - 200: 3 units CBG 201 - 250: 5 units CBG 251 - 300: 8 units CBG 301 - 350: 11 units CBG 351 - 400: 15 units CBG > 400: call MD) 15 mL 11   levothyroxine (SYNTHROID) 175 MCG tablet Take 1 tablet (175 mcg total) by mouth daily. 30 tablet 3   lidocaine 4 % Place 2 patches onto the skin daily. Each lower side of back. 60 patch 1   lidocaine-prilocaine (EMLA) cream Apply 1 Application topically See admin instructions. On dialysis days (Tues,Thurs,Sat)     meclizine (ANTIVERT) 25 MG tablet Take 25 mg by mouth daily as needed.     Methoxy PEG-Epoetin Beta (MIRCERA IJ) Inject 1 Syringe into the skin every 14 (fourteen) days.     metoprolol tartrate (LOPRESSOR) 25 MG tablet Take 1.5 tablets (37.5 mg total) by mouth 2 (two) times daily. 90 tablet 1   midodrine  (PROAMATINE) 5 MG tablet Take 3 tablets (15 mg total) by mouth every 8 (eight) hours. 270 tablet 1   Multiple Vitamins-Iron (MULTIVITAMINS WITH IRON) TABS tablet Take 1 tablet by mouth in the morning. 30 tablet 1   nitroGLYCERIN (NITROSTAT) 0.4 MG SL tablet Place 1 tablet (0.4 mg total) under the tongue every 5 (five) minutes as needed for chest pain. 20 tablet 2   Nutritional Supplements (FEEDING SUPPLEMENT, NEPRO CARB STEADY,) LIQD Take 237 mLs by mouth 2 (two) times daily between meals.  0   ondansetron (ZOFRAN-ODT) 4 MG disintegrating tablet Take 2 mg by mouth every 6 (six) hours as needed for nausea or vomiting.     OXYGEN Inhale into the lungs.     pantoprazole (PROTONIX) 40 MG tablet Take 1 tablet (40 mg total) by mouth 2 (two) times daily before a meal. 60 tablet 2   pregabalin (LYRICA) 25 MG capsule Take 25 mg by mouth daily.     rosuvastatin (CRESTOR) 20 MG tablet Take 1 tablet (20 mg total) by mouth daily. 30 tablet 2   sevelamer carbonate (RENVELA) 800 MG tablet Take 2 tablets (1,600 mg total) by mouth 3 (three) times daily with meals. 180 tablet 2   thiamine (VITAMIN B1) 100 MG tablet Take 1 tablet (100 mg total) by mouth in the morning. 30 tablet 2   umeclidinium bromide (INCRUSE ELLIPTA) 62.5 MCG/ACT AEPB Inhale 1 puff into the lungs daily. 30 each 3   No current facility-administered medications for this visit.     Review of Systems    She denies palpitations, pnd, orthopnea, n, v, dizziness, syncope, weight gain, or early satiety. All other systems reviewed and are otherwise negative except as noted above.   Physical Exam    VS:  BP (!) 112/50 (BP Location: Left Arm, Patient Position: Sitting, Cuff Size: Large)   Pulse 77   Ht  5\' 3"  (1.6 m)   Wt 220 lb (99.8 kg)   SpO2 94%   BMI 38.97 kg/m  GEN: Well nourished, well developed, in no acute distress. HEENT: normal. Neck: Supple, no JVD, carotid bruits, or masses. Cardiac: RRR, no murmurs, rubs, or gallops. No clubbing,  cyanosis, edema.  Radials/DP/PT 2+ and equal bilaterally.  Respiratory:  Respirations regular and unlabored, clear to auscultation bilaterally. GI: Soft, nontender, nondistended, BS + x 4. MS: no deformity or atrophy. Skin: warm and dry, no rash. Neuro:  Strength and sensation are intact. Psych: Normal affect.  Accessory Clinical Findings    ECG personally reviewed by me today -    - no EKG in office today.    Lab Results  Component Value Date   WBC 5.8 08/06/2023   HGB 9.5 (L) 08/06/2023   HCT 30.8 (L) 08/06/2023   MCV 105.5 (H) 08/06/2023   PLT 202 08/06/2023   Lab Results  Component Value Date   CREATININE 4.63 (H) 08/06/2023   BUN 27 (H) 08/06/2023   NA 140 08/06/2023   K 3.7 08/06/2023   CL 97 (L) 08/06/2023   CO2 33 (H) 08/06/2023   Lab Results  Component Value Date   ALT 8 08/06/2023   AST 14 (L) 08/06/2023   ALKPHOS 63 08/06/2023   BILITOT 0.3 08/06/2023   Lab Results  Component Value Date   CHOL 131 07/06/2023   HDL 25 (L) 07/06/2023   LDLCALC 63 07/06/2023   TRIG 213 (H) 07/06/2023   CHOLHDL 5.2 07/06/2023    Lab Results  Component Value Date   HGBA1C 7.1 (H) 04/13/2023    Assessment & Plan    1. Nonobstructive CAD: Most recent LHC in 06/2023 revealed mild nonobstructive CAD (proximal to mid LCx 40%, mid LCx 30%, distal LAD 10% stenosed).  She notes intermittent chest discomfort when she lays down and with significant stress, she has stable chronic dyspnea in the setting of COPD. Recent cath overall reassuring.  Continue to monitor symptoms.  Continue metoprolol, Crestor.  2. 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  She has nonpitting bilateral lower extremity edema, stable chronic dyspnea.  Fluid volume management per HD.  Continue metoprolol.  3. Paroxysmal atrial fibrillation: Newly diagnosed.  Maintaining NSR on exam.  Recent nosebleeds, recommend  humidification for oxygen.  She is not an ideal candidate for long-term amiodarone therapy in the setting of COPD/lung cancer.  Will continue for now.  Continue Toprol, Eliquis.  4. Aortic stenosis: Not appreciated on most recent echo.  5. Hypertension: BP well controlled, runs low in dialysis.  Continue to monitor.  Continue current antihypertensive regimen.   6. Hyperlipidemia: LDL was 63 in 06/2023.  Recently started on Crestor.  Will update fasting lipids, LFTs in 4 to 6 weeks. Continue Crestor.   7. Type 2 diabetes: A1c was 7.1 in 04/2023.  Monitored and managed per PCP.  8. ESRD: On HD.  Follows with nephrology.  9. Hypothyroidism: TSH was 4.630 in 07/2023.  Continue to monitor with new amiodarone.  10. History of TIA: Recurrence.  On Eliquis.  11. PVD: S/p prior R femoral artery stenting, follows with Dr. Darrick Penna of vascular surgery.  12. OSA: Not on CPAP.  13. COPD: On chronic home O2, 2L.  Follows with pulmonology.  14. Lung cancer with concern for metastases: Following with oncology.  15 Disposition: Follow-up next available with Dr. Allyson Sabal (3-4  months).       Joylene Grapes, NP 08/19/2023, 11:23 AM

## 2023-08-20 ENCOUNTER — Other Ambulatory Visit: Payer: Self-pay

## 2023-08-20 ENCOUNTER — Encounter (HOSPITAL_COMMUNITY): Payer: Self-pay | Admitting: Emergency Medicine

## 2023-08-20 ENCOUNTER — Inpatient Hospital Stay (HOSPITAL_COMMUNITY)
Admission: EM | Admit: 2023-08-20 | Discharge: 2023-08-30 | DRG: 193 | Disposition: A | Discharge status: Home Health | Payer: 59 | Attending: Internal Medicine | Admitting: Internal Medicine

## 2023-08-20 ENCOUNTER — Emergency Department (HOSPITAL_COMMUNITY): Payer: 59

## 2023-08-20 DIAGNOSIS — G894 Chronic pain syndrome: Secondary | ICD-10-CM | POA: Diagnosis present

## 2023-08-20 DIAGNOSIS — E871 Hypo-osmolality and hyponatremia: Secondary | ICD-10-CM | POA: Diagnosis not present

## 2023-08-20 DIAGNOSIS — J9622 Acute and chronic respiratory failure with hypercapnia: Secondary | ICD-10-CM | POA: Diagnosis present

## 2023-08-20 DIAGNOSIS — J810 Acute pulmonary edema: Secondary | ICD-10-CM | POA: Diagnosis not present

## 2023-08-20 DIAGNOSIS — Z91199 Patient's noncompliance with other medical treatment and regimen due to unspecified reason: Secondary | ICD-10-CM

## 2023-08-20 DIAGNOSIS — Z7951 Long term (current) use of inhaled steroids: Secondary | ICD-10-CM

## 2023-08-20 DIAGNOSIS — Z90710 Acquired absence of both cervix and uterus: Secondary | ICD-10-CM

## 2023-08-20 DIAGNOSIS — J9601 Acute respiratory failure with hypoxia: Secondary | ICD-10-CM | POA: Diagnosis not present

## 2023-08-20 DIAGNOSIS — G9340 Encephalopathy, unspecified: Secondary | ICD-10-CM | POA: Diagnosis not present

## 2023-08-20 DIAGNOSIS — D631 Anemia in chronic kidney disease: Secondary | ICD-10-CM | POA: Diagnosis present

## 2023-08-20 DIAGNOSIS — G9341 Metabolic encephalopathy: Secondary | ICD-10-CM | POA: Diagnosis not present

## 2023-08-20 DIAGNOSIS — Z7902 Long term (current) use of antithrombotics/antiplatelets: Secondary | ICD-10-CM

## 2023-08-20 DIAGNOSIS — Z885 Allergy status to narcotic agent status: Secondary | ICD-10-CM

## 2023-08-20 DIAGNOSIS — Z8673 Personal history of transient ischemic attack (TIA), and cerebral infarction without residual deficits: Secondary | ICD-10-CM

## 2023-08-20 DIAGNOSIS — E1151 Type 2 diabetes mellitus with diabetic peripheral angiopathy without gangrene: Secondary | ICD-10-CM | POA: Diagnosis present

## 2023-08-20 DIAGNOSIS — Z833 Family history of diabetes mellitus: Secondary | ICD-10-CM

## 2023-08-20 DIAGNOSIS — Z794 Long term (current) use of insulin: Secondary | ICD-10-CM

## 2023-08-20 DIAGNOSIS — Z635 Disruption of family by separation and divorce: Secondary | ICD-10-CM

## 2023-08-20 DIAGNOSIS — Z1152 Encounter for screening for COVID-19: Secondary | ICD-10-CM

## 2023-08-20 DIAGNOSIS — J440 Chronic obstructive pulmonary disease with acute lower respiratory infection: Secondary | ICD-10-CM | POA: Diagnosis present

## 2023-08-20 DIAGNOSIS — I509 Heart failure, unspecified: Secondary | ICD-10-CM

## 2023-08-20 DIAGNOSIS — I959 Hypotension, unspecified: Secondary | ICD-10-CM

## 2023-08-20 DIAGNOSIS — Z888 Allergy status to other drugs, medicaments and biological substances status: Secondary | ICD-10-CM

## 2023-08-20 DIAGNOSIS — Z8619 Personal history of other infectious and parasitic diseases: Secondary | ICD-10-CM

## 2023-08-20 DIAGNOSIS — Z809 Family history of malignant neoplasm, unspecified: Secondary | ICD-10-CM

## 2023-08-20 DIAGNOSIS — J9621 Acute and chronic respiratory failure with hypoxia: Secondary | ICD-10-CM | POA: Diagnosis present

## 2023-08-20 DIAGNOSIS — Y848 Other medical procedures as the cause of abnormal reaction of the patient, or of later complication, without mention of misadventure at the time of the procedure: Secondary | ICD-10-CM | POA: Diagnosis present

## 2023-08-20 DIAGNOSIS — I132 Hypertensive heart and chronic kidney disease with heart failure and with stage 5 chronic kidney disease, or end stage renal disease: Secondary | ICD-10-CM | POA: Diagnosis present

## 2023-08-20 DIAGNOSIS — J441 Chronic obstructive pulmonary disease with (acute) exacerbation: Secondary | ICD-10-CM | POA: Diagnosis not present

## 2023-08-20 DIAGNOSIS — I5043 Acute on chronic combined systolic (congestive) and diastolic (congestive) heart failure: Secondary | ICD-10-CM | POA: Diagnosis present

## 2023-08-20 DIAGNOSIS — Z66 Do not resuscitate: Secondary | ICD-10-CM | POA: Diagnosis present

## 2023-08-20 DIAGNOSIS — E8729 Other acidosis: Secondary | ICD-10-CM | POA: Diagnosis not present

## 2023-08-20 DIAGNOSIS — Y828 Other medical devices associated with adverse incidents: Secondary | ICD-10-CM | POA: Diagnosis present

## 2023-08-20 DIAGNOSIS — Z7901 Long term (current) use of anticoagulants: Secondary | ICD-10-CM

## 2023-08-20 DIAGNOSIS — K222 Esophageal obstruction: Secondary | ICD-10-CM | POA: Diagnosis present

## 2023-08-20 DIAGNOSIS — Z8249 Family history of ischemic heart disease and other diseases of the circulatory system: Secondary | ICD-10-CM

## 2023-08-20 DIAGNOSIS — S51801A Unspecified open wound of right forearm, initial encounter: Secondary | ICD-10-CM | POA: Diagnosis present

## 2023-08-20 DIAGNOSIS — Z9889 Other specified postprocedural states: Secondary | ICD-10-CM

## 2023-08-20 DIAGNOSIS — J159 Unspecified bacterial pneumonia: Secondary | ICD-10-CM | POA: Diagnosis present

## 2023-08-20 DIAGNOSIS — Z923 Personal history of irradiation: Secondary | ICD-10-CM

## 2023-08-20 DIAGNOSIS — R5381 Other malaise: Secondary | ICD-10-CM | POA: Diagnosis not present

## 2023-08-20 DIAGNOSIS — E890 Postprocedural hypothyroidism: Secondary | ICD-10-CM | POA: Diagnosis present

## 2023-08-20 DIAGNOSIS — I480 Paroxysmal atrial fibrillation: Secondary | ICD-10-CM | POA: Diagnosis present

## 2023-08-20 DIAGNOSIS — E785 Hyperlipidemia, unspecified: Secondary | ICD-10-CM | POA: Diagnosis present

## 2023-08-20 DIAGNOSIS — E66812 Obesity, class 2: Secondary | ICD-10-CM | POA: Diagnosis present

## 2023-08-20 DIAGNOSIS — Z8614 Personal history of Methicillin resistant Staphylococcus aureus infection: Secondary | ICD-10-CM

## 2023-08-20 DIAGNOSIS — Z79899 Other long term (current) drug therapy: Secondary | ICD-10-CM

## 2023-08-20 DIAGNOSIS — Z992 Dependence on renal dialysis: Secondary | ICD-10-CM

## 2023-08-20 DIAGNOSIS — Z85118 Personal history of other malignant neoplasm of bronchus and lung: Secondary | ICD-10-CM

## 2023-08-20 DIAGNOSIS — I2510 Atherosclerotic heart disease of native coronary artery without angina pectoris: Secondary | ICD-10-CM | POA: Diagnosis present

## 2023-08-20 DIAGNOSIS — K3189 Other diseases of stomach and duodenum: Secondary | ICD-10-CM | POA: Diagnosis not present

## 2023-08-20 DIAGNOSIS — E1122 Type 2 diabetes mellitus with diabetic chronic kidney disease: Secondary | ICD-10-CM | POA: Diagnosis present

## 2023-08-20 DIAGNOSIS — Z79890 Hormone replacement therapy (postmenopausal): Secondary | ICD-10-CM

## 2023-08-20 DIAGNOSIS — R7989 Other specified abnormal findings of blood chemistry: Secondary | ICD-10-CM | POA: Diagnosis not present

## 2023-08-20 DIAGNOSIS — Z860100 Personal history of colon polyps, unspecified: Secondary | ICD-10-CM

## 2023-08-20 DIAGNOSIS — G4733 Obstructive sleep apnea (adult) (pediatric): Secondary | ICD-10-CM | POA: Diagnosis present

## 2023-08-20 DIAGNOSIS — J9602 Acute respiratory failure with hypercapnia: Secondary | ICD-10-CM | POA: Diagnosis not present

## 2023-08-20 DIAGNOSIS — Z6839 Body mass index (BMI) 39.0-39.9, adult: Secondary | ICD-10-CM

## 2023-08-20 DIAGNOSIS — M170 Bilateral primary osteoarthritis of knee: Secondary | ICD-10-CM | POA: Diagnosis present

## 2023-08-20 DIAGNOSIS — N186 End stage renal disease: Secondary | ICD-10-CM | POA: Diagnosis present

## 2023-08-20 DIAGNOSIS — Z7401 Bed confinement status: Secondary | ICD-10-CM

## 2023-08-20 DIAGNOSIS — Z8719 Personal history of other diseases of the digestive system: Secondary | ICD-10-CM

## 2023-08-20 DIAGNOSIS — J9600 Acute respiratory failure, unspecified whether with hypoxia or hypercapnia: Principal | ICD-10-CM | POA: Diagnosis present

## 2023-08-20 DIAGNOSIS — Z96641 Presence of right artificial hip joint: Secondary | ICD-10-CM | POA: Diagnosis present

## 2023-08-20 DIAGNOSIS — M1A9XX0 Chronic gout, unspecified, without tophus (tophi): Secondary | ICD-10-CM | POA: Diagnosis present

## 2023-08-20 DIAGNOSIS — Z9049 Acquired absence of other specified parts of digestive tract: Secondary | ICD-10-CM

## 2023-08-20 DIAGNOSIS — Z87891 Personal history of nicotine dependence: Secondary | ICD-10-CM

## 2023-08-20 DIAGNOSIS — Z993 Dependence on wheelchair: Secondary | ICD-10-CM

## 2023-08-20 DIAGNOSIS — M25572 Pain in left ankle and joints of left foot: Secondary | ICD-10-CM | POA: Diagnosis not present

## 2023-08-20 DIAGNOSIS — R1310 Dysphagia, unspecified: Secondary | ICD-10-CM | POA: Diagnosis not present

## 2023-08-20 DIAGNOSIS — K219 Gastro-esophageal reflux disease without esophagitis: Secondary | ICD-10-CM | POA: Diagnosis present

## 2023-08-20 DIAGNOSIS — Z9981 Dependence on supplemental oxygen: Secondary | ICD-10-CM

## 2023-08-20 DIAGNOSIS — M898X9 Other specified disorders of bone, unspecified site: Secondary | ICD-10-CM | POA: Diagnosis present

## 2023-08-20 LAB — CBC WITH DIFFERENTIAL/PLATELET
Abs Immature Granulocytes: 0.07 10*3/uL (ref 0.00–0.07)
Basophils Absolute: 0 10*3/uL (ref 0.0–0.1)
Basophils Relative: 0 %
Eosinophils Absolute: 0 10*3/uL (ref 0.0–0.5)
Eosinophils Relative: 0 %
HCT: 32.8 % — ABNORMAL LOW (ref 36.0–46.0)
Hemoglobin: 9.1 g/dL — ABNORMAL LOW (ref 12.0–15.0)
Immature Granulocytes: 1 %
Lymphocytes Relative: 12 %
Lymphs Abs: 0.8 10*3/uL (ref 0.7–4.0)
MCH: 30.3 pg (ref 26.0–34.0)
MCHC: 27.7 g/dL — ABNORMAL LOW (ref 30.0–36.0)
MCV: 109.3 fL — ABNORMAL HIGH (ref 80.0–100.0)
Monocytes Absolute: 0.4 10*3/uL (ref 0.1–1.0)
Monocytes Relative: 6 %
Neutro Abs: 5.8 10*3/uL (ref 1.7–7.7)
Neutrophils Relative %: 81 %
Platelets: 187 10*3/uL (ref 150–400)
RBC: 3 MIL/uL — ABNORMAL LOW (ref 3.87–5.11)
RDW: 16 % — ABNORMAL HIGH (ref 11.5–15.5)
WBC: 7.1 10*3/uL (ref 4.0–10.5)
nRBC: 1.5 % — ABNORMAL HIGH (ref 0.0–0.2)

## 2023-08-20 LAB — BASIC METABOLIC PANEL
Anion gap: 16 — ABNORMAL HIGH (ref 5–15)
BUN: 53 mg/dL — ABNORMAL HIGH (ref 8–23)
CO2: 26 mmol/L (ref 22–32)
Calcium: 9.2 mg/dL (ref 8.9–10.3)
Chloride: 93 mmol/L — ABNORMAL LOW (ref 98–111)
Creatinine, Ser: 6.94 mg/dL — ABNORMAL HIGH (ref 0.44–1.00)
GFR, Estimated: 6 mL/min — ABNORMAL LOW (ref >60)
Glucose, Bld: 273 mg/dL — ABNORMAL HIGH (ref 70–99)
Potassium: 5.3 mmol/L — ABNORMAL HIGH (ref 3.5–5.1)
Sodium: 135 mmol/L (ref 135–145)

## 2023-08-20 LAB — RESP PANEL BY RT-PCR (RSV, FLU A&B, COVID)  RVPGX2
Influenza A by PCR: NEGATIVE
Influenza B by PCR: NEGATIVE
Resp Syncytial Virus by PCR: NEGATIVE
SARS Coronavirus 2 by RT PCR: NEGATIVE

## 2023-08-20 LAB — TROPONIN I (HIGH SENSITIVITY)
Troponin I (High Sensitivity): 86 ng/L — ABNORMAL HIGH (ref <18)
Troponin I (High Sensitivity): 83 ng/L — ABNORMAL HIGH (ref <18)

## 2023-08-20 LAB — I-STAT ARTERIAL BLOOD GAS, ED
Acid-Base Excess: 5 mmol/L — ABNORMAL HIGH (ref 0.0–2.0)
Bicarbonate: 27.1 mmol/L (ref 20.0–28.0)
Calcium, Ion: 1.18 mmol/L (ref 1.15–1.40)
HCT: 32 % — ABNORMAL LOW (ref 36.0–46.0)
Hemoglobin: 10.9 g/dL — ABNORMAL LOW (ref 12.0–15.0)
O2 Saturation: 98 %
Patient temperature: 36.2
Potassium: 4.7 mmol/L (ref 3.5–5.1)
Sodium: 135 mmol/L (ref 135–145)
TCO2: 28 mmol/L (ref 22–32)
pCO2 arterial: 30.9 mm[Hg] — ABNORMAL LOW (ref 32–48)
pH, Arterial: 7.548 — ABNORMAL HIGH (ref 7.35–7.45)
pO2, Arterial: 82 mm[Hg] — ABNORMAL LOW (ref 83–108)

## 2023-08-20 LAB — I-STAT CHEM 8, ED
BUN: 62 mg/dL — ABNORMAL HIGH (ref 8–23)
Calcium, Ion: 1.11 mmol/L — ABNORMAL LOW (ref 1.15–1.40)
Chloride: 100 mmol/L (ref 98–111)
Creatinine, Ser: 7.7 mg/dL — ABNORMAL HIGH (ref 0.44–1.00)
Glucose, Bld: 154 mg/dL — ABNORMAL HIGH (ref 70–99)
HCT: 33 % — ABNORMAL LOW (ref 36.0–46.0)
Hemoglobin: 11.2 g/dL — ABNORMAL LOW (ref 12.0–15.0)
Potassium: 4.9 mmol/L (ref 3.5–5.1)
Sodium: 134 mmol/L — ABNORMAL LOW (ref 135–145)
TCO2: 30 mmol/L (ref 22–32)

## 2023-08-20 LAB — HEPATITIS B SURFACE ANTIGEN: Hepatitis B Surface Ag: NONREACTIVE

## 2023-08-20 LAB — CBG MONITORING, ED
Glucose-Capillary: 149 mg/dL — ABNORMAL HIGH (ref 70–99)
Glucose-Capillary: 280 mg/dL — ABNORMAL HIGH (ref 70–99)

## 2023-08-20 LAB — GLUCOSE, CAPILLARY
Glucose-Capillary: 181 mg/dL — ABNORMAL HIGH (ref 70–99)
Glucose-Capillary: 175 mg/dL — ABNORMAL HIGH (ref 70–99)

## 2023-08-20 LAB — BRAIN NATRIURETIC PEPTIDE: B Natriuretic Peptide: 1345.3 pg/mL — ABNORMAL HIGH (ref 0.0–100.0)

## 2023-08-20 LAB — PROCALCITONIN: Procalcitonin: 0.39 ng/mL

## 2023-08-20 MED ORDER — LIDOCAINE HCL (PF) 1 % IJ SOLN: 5.0000 mL | INTRAMUSCULAR | Status: DC | PRN

## 2023-08-20 MED ORDER — ROSUVASTATIN CALCIUM 20 MG PO TABS
20.0000 mg | ORAL_TABLET | Freq: Every day | ORAL | Status: DC
  Administered 2023-08-20 – 2023-08-30 (×10): 20 mg via ORAL
  Filled 2023-08-20 (×10): qty 1

## 2023-08-20 MED ORDER — HYDROMORPHONE HCL 1 MG/ML IJ SOLN: 0.5000 mg | INTRAMUSCULAR | Status: DC | PRN

## 2023-08-20 MED ORDER — PENTAFLUOROPROP-TETRAFLUOROETH EX AERO
1.0000 | INHALATION_SPRAY | CUTANEOUS | Status: DC | PRN
Start: 2023-08-20 — End: 2023-08-20

## 2023-08-20 MED ORDER — PANTOPRAZOLE SODIUM 40 MG PO TBEC
40.0000 mg | DELAYED_RELEASE_TABLET | Freq: Two times a day (BID) | ORAL | Status: DC
  Administered 2023-08-20 – 2023-08-30 (×16): 40 mg via ORAL
  Filled 2023-08-20 (×16): qty 1

## 2023-08-20 MED ORDER — SODIUM CHLORIDE 0.9 % IV SOLN
3.0000 g | Freq: Every day | INTRAVENOUS | Status: DC
  Administered 2023-08-20: 3 g via INTRAVENOUS
  Filled 2023-08-20: qty 8

## 2023-08-20 MED ORDER — HEPARIN SODIUM (PORCINE) 1000 UNIT/ML DIALYSIS
1000.0000 [IU] | INTRAMUSCULAR | Status: DC | PRN
Start: 2023-08-20 — End: 2023-08-20

## 2023-08-20 MED ORDER — HYDRALAZINE HCL 20 MG/ML IJ SOLN: 10.0000 mg | Freq: Four times a day (QID) | INTRAMUSCULAR | Status: DC | PRN

## 2023-08-20 MED ORDER — INSULIN ASPART 100 UNIT/ML IJ SOLN
0.0000 [IU] | Freq: Three times a day (TID) | INTRAMUSCULAR | Status: DC
  Administered 2023-08-20: 5 [IU] via SUBCUTANEOUS
  Administered 2023-08-20: 2 [IU] via SUBCUTANEOUS
  Administered 2023-08-21: 1 [IU] via SUBCUTANEOUS
  Administered 2023-08-23: 2 [IU] via SUBCUTANEOUS
  Administered 2023-08-24 (×2): 1 [IU] via SUBCUTANEOUS
  Administered 2023-08-25 (×2): 2 [IU] via SUBCUTANEOUS
  Administered 2023-08-25 – 2023-08-26 (×2): 1 [IU] via SUBCUTANEOUS
  Administered 2023-08-26: 2 [IU] via SUBCUTANEOUS
  Administered 2023-08-27: 1 [IU] via SUBCUTANEOUS
  Administered 2023-08-27: 3 [IU] via SUBCUTANEOUS
  Administered 2023-08-27: 2 [IU] via SUBCUTANEOUS
  Administered 2023-08-28 (×3): 1 [IU] via SUBCUTANEOUS
  Administered 2023-08-29: 2 [IU] via SUBCUTANEOUS
  Administered 2023-08-30: 1 [IU] via SUBCUTANEOUS

## 2023-08-20 MED ORDER — ALTEPLASE 2 MG IJ SOLR
2.0000 mg | Freq: Once | INTRAMUSCULAR | Status: DC | PRN
Start: 2023-08-20 — End: 2023-08-20

## 2023-08-20 MED ORDER — SEVELAMER CARBONATE 800 MG PO TABS
1600.0000 mg | ORAL_TABLET | Freq: Three times a day (TID) | ORAL | Status: DC
  Administered 2023-08-20 – 2023-08-30 (×22): 1600 mg via ORAL
  Filled 2023-08-20 (×24): qty 2

## 2023-08-20 MED ORDER — LEVOTHYROXINE SODIUM 75 MCG PO TABS
175.0000 ug | ORAL_TABLET | Freq: Every day | ORAL | Status: DC
  Administered 2023-08-20 – 2023-08-30 (×10): 175 ug via ORAL
  Filled 2023-08-20 (×10): qty 1

## 2023-08-20 MED ORDER — ANTICOAGULANT SODIUM CITRATE 4% (200MG/5ML) IV SOLN
5.0000 mL | Status: DC | PRN
Start: 2023-08-20 — End: 2023-08-20

## 2023-08-20 MED ORDER — HEPARIN SODIUM (PORCINE) 1000 UNIT/ML DIALYSIS
2000.0000 [IU] | INTRAMUSCULAR | Status: AC | PRN
  Administered 2023-08-20: 2000 [IU] via INTRAVENOUS_CENTRAL
  Filled 2023-08-20: qty 2

## 2023-08-20 MED ORDER — PREGABALIN 25 MG PO CAPS
25.0000 mg | ORAL_CAPSULE | Freq: Every day | ORAL | Status: DC
  Administered 2023-08-20 – 2023-08-21 (×2): 25 mg via ORAL
  Filled 2023-08-20 (×2): qty 1

## 2023-08-20 MED ORDER — HEPARIN SODIUM (PORCINE) 1000 UNIT/ML DIALYSIS
2000.0000 [IU] | Freq: Once | INTRAMUSCULAR | Status: AC
Start: 2023-08-20 — End: 2023-08-20
  Administered 2023-08-20: 2000 [IU] via INTRAVENOUS_CENTRAL
  Filled 2023-08-20: qty 2

## 2023-08-20 MED ORDER — OXYCODONE HCL 5 MG PO TABS: 5.0000 mg | ORAL_TABLET | ORAL | Status: DC | PRN

## 2023-08-20 MED ORDER — CHLORHEXIDINE GLUCONATE CLOTH 2 % EX PADS
6.0000 | MEDICATED_PAD | Freq: Every day | CUTANEOUS | Status: DC
Start: 2023-08-20 — End: 2023-08-21
  Administered 2023-08-21: 6 via TOPICAL

## 2023-08-20 MED ORDER — APIXABAN 5 MG PO TABS
5.0000 mg | ORAL_TABLET | Freq: Two times a day (BID) | ORAL | Status: DC
  Administered 2023-08-20 – 2023-08-27 (×14): 5 mg via ORAL
  Filled 2023-08-20 (×14): qty 1

## 2023-08-20 MED ORDER — LIDOCAINE-PRILOCAINE 2.5-2.5 % EX CREA
1.0000 | TOPICAL_CREAM | CUTANEOUS | Status: DC | PRN
Start: 2023-08-20 — End: 2023-08-20

## 2023-08-20 MED ORDER — ACETAMINOPHEN 650 MG RE SUPP: 650.0000 mg | Freq: Four times a day (QID) | RECTAL | Status: DC | PRN

## 2023-08-20 MED ORDER — MECLIZINE HCL 25 MG PO TABS: 25.0000 mg | ORAL_TABLET | ORAL | Status: DC | PRN

## 2023-08-20 MED ORDER — UMECLIDINIUM BROMIDE 62.5 MCG/ACT IN AEPB
1.0000 | INHALATION_SPRAY | Freq: Every day | RESPIRATORY_TRACT | Status: DC
  Administered 2023-08-21 – 2023-08-22 (×2): 1 via RESPIRATORY_TRACT
  Filled 2023-08-20: qty 7

## 2023-08-20 MED ORDER — INSULIN ASPART 100 UNIT/ML IJ SOLN
0.0000 [IU] | Freq: Every day | INTRAMUSCULAR | Status: DC
  Administered 2023-08-26: 2 [IU] via SUBCUTANEOUS
  Administered 2023-08-27: 4 [IU] via SUBCUTANEOUS

## 2023-08-20 MED ORDER — ALLOPURINOL 100 MG PO TABS
100.0000 mg | ORAL_TABLET | Freq: Every day | ORAL | Status: DC
  Administered 2023-08-20 – 2023-08-27 (×7): 100 mg via ORAL
  Filled 2023-08-20 (×7): qty 1

## 2023-08-20 MED ORDER — NEPRO/CARBSTEADY PO LIQD: 237.0000 mL | ORAL | Status: DC | PRN

## 2023-08-20 MED ORDER — ACETAMINOPHEN 325 MG PO TABS
650.0000 mg | ORAL_TABLET | Freq: Four times a day (QID) | ORAL | Status: DC | PRN
  Administered 2023-08-24 – 2023-08-29 (×10): 650 mg via ORAL
  Filled 2023-08-20 (×10): qty 2

## 2023-08-20 MED ORDER — ALBUTEROL SULFATE (2.5 MG/3ML) 0.083% IN NEBU
2.5000 mg | INHALATION_SOLUTION | Freq: Four times a day (QID) | RESPIRATORY_TRACT | Status: DC | PRN
Start: 2023-08-20 — End: 2023-08-23
  Administered 2023-08-22: 2.5 mg via RESPIRATORY_TRACT
  Filled 2023-08-20: qty 3

## 2023-08-20 MED ORDER — MIDODRINE HCL 5 MG PO TABS
15.0000 mg | ORAL_TABLET | Freq: Three times a day (TID) | ORAL | Status: DC
  Administered 2023-08-20 – 2023-08-21 (×3): 15 mg via ORAL
  Filled 2023-08-20 (×4): qty 3

## 2023-08-20 MED ORDER — FLUTICASONE FUROATE-VILANTEROL 200-25 MCG/ACT IN AEPB
1.0000 | INHALATION_SPRAY | Freq: Every day | RESPIRATORY_TRACT | Status: DC
  Administered 2023-08-21 – 2023-08-22 (×2): 1 via RESPIRATORY_TRACT
  Filled 2023-08-20: qty 28

## 2023-08-20 MED ORDER — AMIODARONE HCL 200 MG PO TABS
400.0000 mg | ORAL_TABLET | Freq: Every day | ORAL | Status: DC
  Administered 2023-08-20 – 2023-08-21 (×2): 400 mg via ORAL
  Filled 2023-08-20 (×2): qty 2

## 2023-08-20 NOTE — Progress Notes (Signed)
Pharmacy Antibiotic Note  [name] [name] is a 72 y.o. female admitted on 08/20/2023 with pneumonia.  Pharmacy has been consulted for unasyn dosing. Patient is ESRD on HD TTS  Plan: Unasyn 3G IV q24 hours     Temp (24hrs), Avg:98.5 F (36.9 C), Min:97.8 F (36.6 C), Max:99.1 F (37.3 C)  Recent Labs  Lab 08/20/23 0456 08/20/23 0651  WBC 7.1  --   CREATININE  --  7.70*    Estimated Creatinine Clearance: 7.4 mL/min (A) (by C-G formula based on SCr of 7.7 mg/dL (H)).    Allergies  Allergen Reactions   Nebivolol Swelling    Chest pain (reaction to Bystolic)   Zestril [Lisinopril] Swelling and Other (See Comments)   Ace Inhibitors Swelling and Other (See Comments)    Tongue swell   Morphine And Codeine Itching    Antimicrobials this admission: Unasyn 11/12>>  Thank you for allowing pharmacy to be a part of this patient's care.  Toniann Fail Jaedin Trumbo 08/20/2023 10:36 AM

## 2023-08-20 NOTE — ED Provider Notes (Signed)
[place] EMERGENCY DEPARTMENT AT [facility] Provider Note   CSN: [id number] Arrival date & time: 08/20/23  0348     History  Chief Complaint  Patient presents with   Respiratory Distress    [name] [name] is a 72 y.o. female.  The history is provided by the patient and the EMS personnel. The history is limited by the condition of the patient.  Shortness of Breath Severity:  Severe Onset quality:  Sudden Duration: hours. Timing:  Constant Progression:  Unchanged Chronicity:  Recurrent Context: not URI and not weather changes   Relieved by:  Nothing Worsened by:  Nothing Ineffective treatments:  None tried Associated symptoms: no chest pain, no diaphoresis, no vomiting and no wheezing   Patient with ESRD and COPD presents with sudden onset SOB.  No CP,  needed to increase home O2. Was found to be hypoxic to 80       Home Medications Prior to Admission medications   Medication Sig Start Date End Date Taking? Authorizing Provider  acetaminophen (TYLENOL) 500 MG tablet Take 1 tablet (500 mg total) by mouth See admin instructions. Take 1,000mg  (2 tablets) by mouth twice a day in addition to 1,000mg  (2 tablets) twice daily as needed for pain. 05/14/23   Ghimire, Werner Lean, MD  albuterol (PROVENTIL) (2.5 MG/3ML) 0.083% nebulizer solution Take 3 mLs (2.5 mg total) by nebulization every 6 (six) hours as needed for wheezing or shortness of breath. 05/14/23   Ghimire, Werner Lean, MD  albuterol (VENTOLIN HFA) 108 (90 Base) MCG/ACT inhaler Inhale 2 puffs into the lungs every 6 (six) hours as needed for shortness of breath (COPD). 05/14/23   Ghimire, Werner Lean, MD  allopurinol (ZYLOPRIM) 100 MG tablet Take 1 tablet (100 mg total) by mouth daily. 05/14/23   Ghimire, Werner Lean, MD  amiodarone (PACERONE) 200 MG tablet Take 2 tablets (400 mg total) by mouth 2 (two) times daily for 7 days, THEN 2 tablets (400 mg total) daily for 7 days, THEN 1 tablet (200 mg total) daily. 07/20/23  09/20/23  GhimireWerner Lean, MD  amiodarone (PACERONE) 200 MG tablet Take 200 mg by mouth daily. 07/23/23   [provider]  apixaban (ELIQUIS) 5 MG TABS tablet Take 1 tablet (5 mg total) by mouth 2 (two) times daily. 07/20/23   Ghimire, Werner Lean, MD  budesonide-formoterol (SYMBICORT) 160-4.5 MCG/ACT inhaler Inhale 2 puffs into the lungs in the morning and at bedtime. 05/14/23   Ghimire, Werner Lean, MD  carboxymethylcellulose (REFRESH PLUS) 0.5 % SOLN Place 1 drop into both eyes 3 (three) times daily as needed (dry/irritated eyes.).    [provider]  diclofenac Sodium (VOLTAREN) 1 % GEL Apply 2 g topically 4 (four) times daily. 04/17/23   Joseph Art, DO  docusate sodium (COLACE) 100 MG capsule Take 1 capsule (100 mg total) by mouth daily. 05/14/23   Ghimire, Werner Lean, MD  fluticasone (FLONASE) 50 MCG/ACT nasal spray Place 1 spray into both nostrils daily. 05/14/23   Ghimire, Werner Lean, MD  insulin lispro (HUMALOG KWIKPEN) 100 UNIT/ML KwikPen 0-15 Units, Subcutaneous, 3 times daily with meal CBG < 70: Implement Hypoglycemia meausers CBG 70 - 120: 0 units CBG 121 - 150: 2 units CBG 151 - 200: 3 units CBG 201 - 250: 5 units CBG 251 - 300: 8 units CBG 301 - 350: 11 units CBG 351 - 400: 15 units CBG > 400: call MD Patient taking differently: Inject 0-15 Units into the skin 3 (three)  times daily. 0-15 Units, Subcutaneous, 3 times daily with meal CBG < 70: Implement Hypoglycemia meausers CBG 70 - 120: 0 units CBG 121 - 150: 2 units CBG 151 - 200: 3 units CBG 201 - 250: 5 units CBG 251 - 300: 8 units CBG 301 - 350: 11 units CBG 351 - 400: 15 units CBG > 400: call MD 05/14/23   Maretta Bees, MD  levothyroxine (SYNTHROID) 175 MCG tablet Take 1 tablet (175 mcg total) by mouth daily. 05/14/23   Ghimire, Werner Lean, MD  lidocaine 4 % Place 2 patches onto the skin daily. Each lower side of back. 05/14/23   Ghimire, Werner Lean, MD  lidocaine-prilocaine (EMLA) cream Apply 1 Application topically See  admin instructions. On dialysis days (Tues,Thurs,Sat) 02/13/22   [provider]  meclizine (ANTIVERT) 25 MG tablet Take 25 mg by mouth daily as needed. 06/12/23   [provider]  Methoxy PEG-Epoetin Beta (MIRCERA IJ) Inject 1 Syringe into the skin every 14 (fourteen) days. 06/01/23 05/30/24  [provider]  metoprolol tartrate (LOPRESSOR) 25 MG tablet Take 1.5 tablets (37.5 mg total) by mouth 2 (two) times daily. 07/20/23   Ghimire, Werner Lean, MD  midodrine (PROAMATINE) 5 MG tablet Take 3 tablets (15 mg total) by mouth every 8 (eight) hours. 07/20/23   Ghimire, Werner Lean, MD  Multiple Vitamins-Iron (MULTIVITAMINS WITH IRON) TABS tablet Take 1 tablet by mouth in the morning. 05/14/23   Ghimire, Werner Lean, MD  nitroGLYCERIN (NITROSTAT) 0.4 MG SL tablet Place 1 tablet (0.4 mg total) under the tongue every 5 (five) minutes as needed for chest pain. 07/06/23 07/05/24  Cyndi Bender, NP  Nutritional Supplements (FEEDING SUPPLEMENT, NEPRO CARB STEADY,) LIQD Take 237 mLs by mouth 2 (two) times daily between meals. 04/17/23   Joseph Art, DO  ondansetron (ZOFRAN-ODT) 4 MG disintegrating tablet Take 2 mg by mouth every 6 (six) hours as needed for nausea or vomiting. 07/10/23   [provider]  OXYGEN Inhale into the lungs.    [provider]  pantoprazole (PROTONIX) 40 MG tablet Take 1 tablet (40 mg total) by mouth 2 (two) times daily before a meal. 05/14/23   Ghimire, Werner Lean, MD  pregabalin (LYRICA) 25 MG capsule Take 25 mg by mouth daily. 05/29/23   [provider]  rosuvastatin (CRESTOR) 20 MG tablet Take 1 tablet (20 mg total) by mouth daily. 07/06/23   Cyndi Bender, NP  sevelamer carbonate (RENVELA) 800 MG tablet Take 2 tablets (1,600 mg total) by mouth 3 (three) times daily with meals. 05/14/23   Ghimire, Werner Lean, MD  thiamine (VITAMIN B1) 100 MG tablet Take 1 tablet (100 mg total) by mouth in the morning. 05/14/23   Ghimire, Werner Lean, MD  umeclidinium bromide  (INCRUSE ELLIPTA) 62.5 MCG/ACT AEPB Inhale 1 puff into the lungs daily. 05/14/23   Ghimire, Werner Lean, MD      Allergies    Nebivolol, Zestril [lisinopril], Ace inhibitors, and Morphine and codeine    Review of Systems   Review of Systems  Constitutional:  Negative for diaphoresis.  HENT:  Negative for facial swelling.   Eyes:  Negative for redness.  Respiratory:  Positive for shortness of breath. Negative for wheezing and stridor.   Cardiovascular:  Negative for chest pain.  Gastrointestinal:  Negative for vomiting.  All other systems reviewed and are negative.   Physical Exam Updated Vital Signs BP (!) 103/42   Pulse 87   Temp 97.8 F (36.6 C) (Axillary)  Resp 19   SpO2 92%  Physical Exam Vitals and nursing note reviewed.  Constitutional:      General: She is not in acute distress.    Appearance: Normal appearance. She is well-developed.  HENT:     Head: Normocephalic and atraumatic.     Nose: Nose normal.  Eyes:     Pupils: Pupils are equal, round, and reactive to light.  Cardiovascular:     Rate and Rhythm: Normal rate and regular rhythm.     Pulses: Normal pulses.     Heart sounds: Normal heart sounds.  Pulmonary:     Effort: Pulmonary effort is normal. No respiratory distress.     Breath sounds: Rales present.  Abdominal:     General: Bowel sounds are normal. There is no distension.     Palpations: Abdomen is soft.     Tenderness: There is no abdominal tenderness. There is no guarding or rebound.  Musculoskeletal:        General: Normal range of motion.     Cervical back: Neck supple.  Skin:    General: Skin is warm and dry.     Capillary Refill: Capillary refill takes less than 2 seconds.     Findings: No erythema or rash.  Neurological:     General: No focal deficit present.     Mental Status: She is alert and oriented to person, place, and time.     Deep Tendon Reflexes: Reflexes normal.  Psychiatric:        Mood and Affect: Mood normal.     ED  Results / Procedures / Treatments   Labs (all labs ordered are listed, but only abnormal results are displayed) Results for orders placed or performed during the hospital encounter of 08/20/23  CBC with Differential  Result Value Ref Range   WBC 7.1 4.0 - 10.5 K/uL   RBC 3.00 (L) 3.87 - 5.11 MIL/uL   Hemoglobin 9.1 (L) 12.0 - 15.0 g/dL   HCT 78.2 (L) 95.6 - 21.3 %   MCV 109.3 (H) 80.0 - 100.0 fL   MCH 30.3 26.0 - 34.0 pg   MCHC 27.7 (L) 30.0 - 36.0 g/dL   RDW 08.6 (H) 57.8 - 46.9 %   Platelets 187 150 - 400 K/uL   nRBC 1.5 (H) 0.0 - 0.2 %   Neutrophils Relative % 81 %   Neutro Abs 5.8 1.7 - 7.7 K/uL   Lymphocytes Relative 12 %   Lymphs Abs 0.8 0.7 - 4.0 K/uL   Monocytes Relative 6 %   Monocytes Absolute 0.4 0.1 - 1.0 K/uL   Eosinophils Relative 0 %   Eosinophils Absolute 0.0 0.0 - 0.5 K/uL   Basophils Relative 0 %   Basophils Absolute 0.0 0.0 - 0.1 K/uL   Immature Granulocytes 1 %   Abs Immature Granulocytes 0.07 0.00 - 0.07 K/uL  Brain natriuretic peptide  Result Value Ref Range   B Natriuretic Peptide 1,345.3 (H) 0.0 - 100.0 pg/mL  Troponin I (High Sensitivity)  Result Value Ref Range   Troponin I (High Sensitivity) 86 (H) <18 ng/L   No results found.   EKG NSR with non specific conduction delay artifact   Radiology No results found.  Procedures Procedures    Medications Ordered in ED Medications - No data to display  ED Course/ Medical Decision Making/ A&P  Medical Decision Making Amount and/or Complexity of Data Reviewed Labs: ordered.    Details: Elevated troponin 86, normal white count 7.1, low hemoglobin 9.1, normal platelets. BNP 1345  Radiology: ordered and independent interpretation performed.    Details: Cardiomegaly and CHF on CXR by me  ECG/medicine tests: ordered and independent interpretation performed. Decision-making details documented in ED Course.  Risk Decision regarding hospitalization. Risk Details:  Will need dialysis     Final Clinical Impression(s) / ED Diagnoses Final diagnoses:  Acute respiratory failure, unspecified whether with hypoxia or hypercapnia (HCC)  Acute congestive heart failure, unspecified heart failure type (HCC)  Elevated troponin I level   The patient appears reasonably stabilized for admission considering the current resources, flow, and capabilities available in the ED at this time, and I doubt any other [facility] requiring further screening and/or treatment in the ED prior to admission.  Rx / DC Orders ED Discharge Orders     None         Allysson Rinehimer, MD 08/20/23 2130

## 2023-08-20 NOTE — H&P (Signed)
[facility] History and Physical  [name] [name] BJY:[id number] DOB: [birth year] DOA: 08/20/2023 PCP: Vladimir Crofts, FNP  Presented from: Home Chief Complaint: Shortness of breath  History of Present Illness: [name] [name] is a 72 y.o. female with PMH significant for ESRD-HD-TTS, obesity, OSA, DM2, HTN, HLD, paroxysmal A-fib on Eliquis, CHF with EF 35 to 40%, CAD, PAD, TIA, COPD on chronic oxygen, chronic anemia, chronic pain syndrome, GERD, hypothyroidism, non-small cell lung cancer s/p curative radiation. Most recently hospitalized 10/3 to 10/12 for decompensated respiratory failure, septic shock required ICU admission.  Also had A-fib with RVR that time and was started on amiodarone by cardiology.  Imagings at the time also showed some concern of metastatic disease, recommended follow-up with primary oncologist.  Earlier this morning, patient was brought to the ED by EMS from home for respite distress.  EMS found her O2 sat at 80% on baseline 3 L oxygen. Last dialysis was on the schedule on 11/9  In the ED, patient is afebrile, heart rate in 80s, blood pressure 103/42, respirate in 20s on 5 L oxygen Initial labs with  WBC count 7.1, hemoglobin 9.1, BNP over 1300, troponin 86 Sodium 134, glucose 154, ABG with pH 7.54, pCO2 31, bicarb 27 Resp virus panel unremarkable Chest x-[name] [name] showed severe cardiomegaly with central vascular prominence, no overt edema.  It also showed an opacity in the retrocardiac area- atelectasis versus consolidation Nephrology service was consulted Hospital service was consulted for further evaluation and management  I received this patient as a carryover admission this morning At the time of my evaluation, patient was propped up in bed. On 3 L oxygen by nasal cannula.  Slow to respond but oriented x 3.  Lives at home with her son.  She says she has been bedbound for last few years.  Does not miss dialysis appointments.  Review of Systems:  All  systems were reviewed and were negative unless otherwise mentioned in the HPI   Past medical history: Past Medical History:  Diagnosis Date   Anemia    Arthritis    Asthma    Chronic diastolic (congestive) heart failure (HCC)    Chronic kidney disease    Dialysis T-TH-Sat   Chronic pain syndrome    Class 2 obesity due to excess calories with body mass index (BMI) of 36.0 to 36.9 in adult    COPD (chronic obstructive pulmonary disease) (HCC)    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   Full dentures    GERD (gastroesophageal reflux disease)    Gout    History of hiatal hernia    History of transfusion    Hx MRSA infection    abscess left groin   Hyperlipidemia    Hypertension    Hypothyroid    Intractable nausea and vomiting 02/15/2021   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HCC)    Peripheral vascular disease (HCC)    Sleep apnea    UNABLE TO TOLERATE C PAP   Stress incontinence    TIA (transient ischemic attack)     X2 NO RESIDUAL PROBLEMS   Wears glasses     Past surgical history: Past Surgical History:  Procedure Laterality Date   ABDOMINAL HYSTERECTOMY     AV FISTULA PLACEMENT Left 12/01/2021   Procedure: INSERTION OF LEFT ARM ARTERIOVENOUS (AV) GORE-TEX GRAFT;  Surgeon: Nada Libman, MD;  Location: [facility] OR;  Service: Vascular;  Laterality: Left;   BACK SURGERY  2020  BASCILIC VEIN TRANSPOSITION Left 04/05/2020   Procedure: BASILIC VEIN TRANSPOSITION FIRST STAGE;  Surgeon: Chuck Hint, MD;  Location: [facility] OR;  Service: Vascular;  Laterality: Left;   BRONCHIAL BIOPSY  01/23/2022   Procedure: BRONCHIAL BIOPSIES;  Surgeon: Josephine Igo, DO;  Location: [facility] ENDOSCOPY;  Service: Pulmonary;;   BRONCHIAL BIOPSY  08/07/2022   Procedure: BRONCHIAL BIOPSIES;  Surgeon: Josephine Igo, DO;  Location: [facility] ENDOSCOPY;  Service: Pulmonary;;   BRONCHIAL BRUSHINGS  08/07/2022   Procedure: BRONCHIAL BRUSHINGS;  Surgeon: Josephine Igo, DO;   Location: [facility]OS[facility];  Service: Pulmonary;;   BRONCHIAL NEEDLE ASPIRATION BIOPSY  01/23/2022   Procedure: BRONCHIAL NEEDLE ASPIRATION BIOPSIES;  Surgeon: Josephine Igo, DO;  Location: [facility] ENDOS[facility];  Service: Pulmonary;;   BRONCHIAL NEEDLE ASPIRATION BIOPSY  08/07/2022   Procedure: BRONCHIAL NEEDLE ASPIRATION BIOPSIES;  Surgeon: Josephine Igo, DO;  Location: [facility] ENDOS[facility];  Service: Pulmonary;;   COLON SURGERY  1995   DUE TO POLYP   DILATION AND CURETTAGE OF UTERUS     ENDOBRONCHIAL ULTRASOUND  01/23/2022   Procedure: ENDOBRONCHIAL ULTRASOUND;  Surgeon: Josephine Igo, DO;  Location: [facility]OS[facility];  Service: Pulmonary;;   ENTEROSCOPY N/A 07/10/2021   Procedure: ENTEROSCOPY;  Surgeon: Kerin Salen, MD;  Location: [facility] ENDOSCOPY;  Service: Gastroenterology;  Laterality: N/A;   ESOPHAGOGASTRODUODENOSCOPY N/A 01/25/2021   Procedure: ESOPHAGOGASTRODUODENOSCOPY (EGD);  Surgeon: Vida Rigger, MD;  Location: [facility] ENDOSCOPY;  Service: Endoscopy;  Laterality: N/A;   FINE NEEDLE ASPIRATION  01/23/2022   Procedure: FINE NEEDLE ASPIRATION (FNA) LINEAR;  Surgeon: Josephine Igo, DO;  [facility]: [facility]OS[facility];  Service: Pulmonary;;   FOREARM FRACTURE SURGERY     Left arm   GIVENS CAPSULE STUDY N/A 07/07/2021   Procedure: GIVENS CAPSULE STUDY;  Surgeon: Vida Rigger, MD;  Location[facility] ENDOSCOPY;  Service: Endoscopy;  Laterality: N/A;   HERNIA REPAIR     w/ mesh   HOT HEMOSTASIS N/A 07/10/2021   Procedure: HOT HEMOSTASIS (ARGON PLASMA COAGULATION/BICAP);  Surgeon: Kerin Salen, MD;  Location: [facility] ENDOSCOPY;  Service: Gastroenterology;  Laterality: N/A;   INCISION AND DRAINAGE ABSCESS Left 02/04/2013   Procedure: INCISION AND DRAINAGE LEFT BUTTOCK ABSCESS; INCISION AND DRAINAGE LEFT BREAST ABSCESS;  Surgeon: Shelly Rubenstein, MD;  Location: [facility] OR;  Service: General;  Laterality: Left;   INCISION AND DRAINAGE ABSCESS N/A 02/12/2013   Procedure: INCISION AND DEBRIDEMENT BUTTOCK WOUND ;  Surgeon: Wilmon Arms. Corliss Skains, MD;   Location: [facility] OR;  Service: General;  Laterality: N/A;   INCISION AND DRAINAGE ABSCESS N/A 02/14/2013   Procedure: INCISION AND DRAINAGE/DRESSING CHANGE;  Surgeon: Shelly Rubenstein, MD;  Location: [facility] OR;  Service: General;  Laterality: N/A;   INCISION AND DRAINAGE ABSCESS N/A 03/21/2015   Procedure: INCISION AND DRAINAGE PUBIC ABSCESS;  Surgeon: Glenna Fellows, MD;  Location: [facility] ORS;  Service: General;  Laterality: N/A;   INCISION AND DRAINAGE PERIRECTAL ABSCESS Left 02/10/2013   Procedure: IRRIGATION AND DEBRIDEMENT OF BUTTOCK/PERINEAL ABSCESS;  Surgeon: Wilmon Arms. Corliss Skains, MD;  Location: [facility] OR;  Service: General;  Laterality: Left;   INCISION AND DRAINAGE PERIRECTAL ABSCESS N/A 02/16/2013   Procedure: IRRIGATION AND DEBRIDEMENT PERINEAL ABSCESS;  Surgeon: Liz Malady, MD;  [facility]: [facility];  Service: General;  Laterality: N/A;   IR FLUORO GUIDE CV LINE RIGHT  11/30/2021   IR US GUIDE VASC ACCESS RIGHT  11/30/2021   IRRIGATION AND DEBRIDEMENT ABSCESS N/A 02/06/2013   Procedure: IRRIGATION AND DEBRIDEMENT BUTTOCK ABSCESS AND DRESSING CHANGE;  Surgeon:  Shelly Rubenstein, MD;  [facility]: [facility];  Service: General;  Laterality: N/A;   IRRIGATION AND DEBRIDEMENT ABSCESS Left 02/08/2013   Procedure: IRRIGATION AND DEBRIDEMENT ABSCESS/DRESSING CHANGE;  Surgeon: Cherylynn Ridges, MD;  Location: [facility];  Service: General;  Laterality: Left;   JOINT REPLACEMENT  2010 / 2012    LAPAROSCOPIC CHOLECYSTECTOMY     LEFT HEART CATH AND CORONARY ANGIOGRAPHY N/A 07/05/2023   Procedure: LEFT HEART CATH AND CORONARY ANGIOGRAPHY;  Surgeon: Lennette Bihari, MD;  [facility]: [facility] INVASIVE CV LAB;  Service: Cardiovascular;  Laterality: N/A;   LEFT HEART CATHETERIZATION WITH CORONARY ANGIOGRAM N/A 07/02/2014   Procedure: LEFT HEART CATHETERIZATION WITH CORONARY ANGIOGRAM;  Surgeon: Runell Gess, MD;  Location[facility] CATH LAB;  Service: Cardiovascular;  Laterality: N/A;   LOWER EXTREMITY ANGIOGRAM Right 04/20/2016   Procedure: Lower  Extremity Angiogram;  Surgeon: Sherren Kerns, MD;  Location: [facility] INVASIVE CV LAB;  Service: Cardiovascular;  Laterality: Right;   MULTIPLE TOOTH EXTRACTIONS     PERIPHERAL VASCULAR CATHETERIZATION N/A 04/20/2016   Procedure: Abdominal Aortogram;  Surgeon: Sherren Kerns, MD;  Location: [facility] INVASIVE CV LAB;  Service: Cardiovascular;  Laterality: N/A;   PERIPHERAL VASCULAR CATHETERIZATION Right 04/20/2016   Procedure: Peripheral Vascular Intervention;  Surgeon: Sherren Kerns, MD;  Location: [facility] INVASIVE CV LAB;  Service: Cardiovascular;  Laterality: Right;  popiteal   RIGHT HEART CATH N/A 10/27/2018   Procedure: RIGHT HEART CATH;  Surgeon: Laurey Morale, MD;  Location: [facility] INVASIVE CV LAB;  Service: Cardiovascular;  Laterality: N/A;   RIGHT HEART CATH N/A 03/20/2019   Procedure: RIGHT HEART CATH;  Surgeon: Laurey Morale, MD;  Location: [facility] INVASIVE CV LAB;  Service: Cardiovascular;  Laterality: N/A;   THYROIDECTOMY     TOTAL HIP REVISION Right 08/11/2014   Procedure: RIGHT ACETABULAR REVISION;  Surgeon: Loanne Drilling, MD;  Location: [facility] ORS;  Service: Orthopedics;  Laterality: Right;   VASCULAR SURGERY     VIDEO BRONCHOSCOPY WITH ENDOBRONCHIAL ULTRASOUND Bilateral 08/07/2022   Procedure: VIDEO BRONCHOSCOPY WITH ENDOBRONCHIAL ULTRASOUND;  Surgeon: Josephine Igo, DO;  Location: [facility] ENDOSCOPY;  Service: Pulmonary;  Laterality: Bilateral;   VIDEO BRONCHOSCOPY WITH RADIAL ENDOBRONCHIAL ULTRASOUND  01/23/2022   Procedure: RADIAL ENDOBRONCHIAL ULTRASOUND;  Surgeon: Josephine Igo, DO;  Location: [facility] ENDOSCOPY;  Service: Pulmonary;;    Social History:  reports that she quit smoking about 22 months ago. Her smoking use included cigarettes. She started smoking about 41 years ago. She has a 10 pack-year smoking history. She has been exposed to tobacco smoke. She has quit using smokeless tobacco. She reports current alcohol use of about 1.0 standard drink of alcohol per week. She reports that she does  not use drugs.  Allergies:  Allergies  Allergen Reactions   Nebivolol Swelling    Chest pain (reaction to Bystolic)   Zestril [Lisinopril] Swelling and Other (See Comments)   Ace Inhibitors Swelling and Other (See Comments)    Tongue swell   Morphine And Codeine Itching   Nebivolol, Zestril [lisinopril], Ace inhibitors, and Morphine and codeine   Family history:  Family History  Problem Relation Age of Onset   Diabetes Brother    Cardiomyopathy Mother    Heart disease Mother    Cancer Father      Physical Exam: Vitals:   08/20/23 0356 08/20/23 0600 08/20/23 0747 08/20/23 0823  BP: (!) 103/42 137/89 (!) 147/70 (!) 137/114  Pulse: 87 84 84   Resp: 19 (!) 28 (!)  25 (!) 24  Temp:    99.1 F (37.3 C)  TempSrc:    Oral  SpO2: 92% 95% 100%    Wt Readings from Last 3 Encounters:  08/19/23 99.8 kg  07/20/23 98.2 kg  07/06/23 109.7 kg   There is no height or weight on file to calculate BMI.  General exam: Pleasant elderly African-American female.  Not in physical distress Skin: No rashes, lesions or ulcers. HEENT: Atraumatic, normocephalic, no obvious bleeding Lungs: Right basilar crackles present. CVS: Regular rate and rhythm, no murmur GI/Abd soft, nontender, nondistended, bowel sound present CNS: Alert, awake, slow to respond but oriented x 3 Psychiatry: Sad affect Extremities: Trace to 1+ bilateral pedal edema seems chronic.   ------------------------------------------------------------------------------------------------------ Assessment/Plan: Principal Problem:   Acute respiratory failure (HCC)  Acute on chronic respiratory failure with hypoxia Acute pneumonia COPD Brought in from home for respiratory distress, found to have O2 sat in 80% on 3 L baseline oxygen While in the ED, required oxygen up to 5 L at rest.  Currently on 3L at rest. Chest x-[name] [name] without edema but possible retrocardiac infiltrate WBC count not elevated.  Obtain procalcitonin  level. Start on course of Unasyn. Monitor respiratory status. 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  Has chronic bilateral pedal edema Last dialysis on schedule was 08/17/2023 Nephrology consulted for volume management Most recent echo from July 2024 with EF 35 to 40% PTA meds-on midodrine 15 mg every 8 hours.  Resume the same  Nonobstructive CAD H/o PAD s/p right femoral artery stenting TIA, HLD Last cardiac cath September 2024 with nonobstructive CAD PTA meds-Eliquis, Crestor  Paroxysmal A-fib Currently in normal sinus rhythm.  Continue amiodarone chronically on Eliquis  Type 2 diabetes mellitus A1c 7.1 on July 2024 PTA meds-sliding scale insulin Continue SSI/Accu-Cheks Recent Labs  Lab 08/20/23 0817  GLUCAP 149*   OSA Nontolerant to CPAP  chronic anemia GERD  H/o gout Allopurinol 100 mg daily  Hypothyroidism Continue Synthroid  H/o non-small cell lung cancer s/p curative radiation Possible new metastasis CT chest on 10/3 suspected a progressive enlargement of soft tissue nodule suspicious for progressive metastasis.  Recommended follow-up with primary oncologist. I see that she has been scheduled for an outpatient PET scan on 11/22.  Home med list has not been obtained/updated by [facility] at the time of admission.  Please double check admission reconciliation.   Mobility: Wheelchair-bound  Goals of care   Code Status: Limited: Do not attempt resuscitation (DNR) -DNR-LIMITED -Do Not Intubate/DNI .    DVT prophylaxis:   apixaban (ELIQUIS) tablet 5 mg   Antimicrobials: Unasyn IV Fluid: None Consultants: Nephro Family Communication: Called and updated her son [name] [name].  DNR/DNI confirmed with the her son  Dispo: The patient is from: Home              Anticipated d/c is to: Home, likely in 2 to 3 days  Diet: Diet Order              Diet renal/carb modified with fluid restriction Diet-HS Snack? Nothing; Fluid restriction: 1200 mL Fluid; Room service appropriate? Yes; Fluid consistency: Thin  Diet effective now                    ------------------------------------------------------------------------------------- Severity of Illness: The appropriate patient status for this patient is OBSERVATION. Observation status is judged to be reasonable and necessary in  order to provide the required intensity of service to ensure the patient's safety. The patient's presenting symptoms, physical exam findings, and initial radiographic and laboratory data in the context of their medical condition is felt to place them at decreased risk for further clinical deterioration. Furthermore, it is anticipated that the patient will be medically stable for discharge from the hospital within 2 midnights of admission.    Home Meds: Prior to Admission medications   Medication Sig Start Date End Date Taking? Authorizing Provider  acetaminophen (TYLENOL) 500 MG tablet Take 1 tablet (500 mg total) by mouth See admin instructions. Take 1,000mg  (2 tablets) by mouth twice a day in addition to 1,000mg  (2 tablets) twice daily as needed for pain. Patient taking differently: Take 1,000 mg by mouth in the morning and at bedtime. 05/14/23  Yes Ghimire, Werner Lean, MD  albuterol (PROVENTIL) (2.5 MG/3ML) 0.083% nebulizer solution Take 3 mLs (2.5 mg total) by nebulization every 6 (six) hours as needed for wheezing or shortness of breath. Patient taking differently: Take 2.5 mg by nebulization in the morning and at bedtime. 05/14/23  Yes Ghimire, Werner Lean, MD  albuterol (VENTOLIN HFA) 108 (90 Base) MCG/ACT inhaler Inhale 2 puffs into the lungs every 6 (six) hours as needed for shortness of breath (COPD). Patient taking differently: Inhale 2 puffs into the lungs in the morning and at bedtime. 05/14/23  Yes Ghimire, Werner Lean, MD  allopurinol (ZYLOPRIM) 100 MG tablet Take  1 tablet (100 mg total) by mouth daily. 05/14/23  Yes Ghimire, Werner Lean, MD  amiodarone (PACERONE) 200 MG tablet Take 2 tablets (400 mg total) by mouth 2 (two) times daily for 7 days, THEN 2 tablets (400 mg total) daily for 7 days, THEN 1 tablet (200 mg total) daily. 07/20/23 09/20/23 Yes Ghimire, Werner Lean, MD  apixaban (ELIQUIS) 5 MG TABS tablet Take 1 tablet (5 mg total) by mouth 2 (two) times daily. 07/20/23  Yes Ghimire, Werner Lean, MD  budesonide-formoterol (SYMBICORT) 160-4.5 MCG/ACT inhaler Inhale 2 puffs into the lungs in the morning and at bedtime. 05/14/23  Yes Ghimire, Werner Lean, MD  diclofenac Sodium (VOLTAREN) 1 % GEL Apply 2 g topically 4 (four) times daily. Patient taking differently: Apply 1 Application topically daily. 04/17/23  Yes Marlin Canary U, DO  docusate sodium (COLACE) 100 MG capsule Take 1 capsule (100 mg total) by mouth daily. Patient taking differently: Take 100 mg by mouth 2 (two) times daily. 05/14/23  Yes Ghimire, Werner Lean, MD  fluticasone (FLONASE) 50 MCG/ACT nasal spray Place 1 spray into both nostrils daily. Patient taking differently: Place 2 sprays into both nostrils daily. 05/14/23  Yes Ghimire, Werner Lean, MD  insulin lispro (HUMALOG KWIKPEN) 100 UNIT/ML KwikPen 0-15 Units, Subcutaneous, 3 times daily with meal CBG < 70: Implement Hypoglycemia meausers CBG 70 - 120: 0 units CBG 121 - 150: 2 units CBG 151 - 200: 3 units CBG 201 - 250: 5 units CBG 251 - 300: 8 units CBG 301 - 350: 11 units CBG 351 - 400: 15 units CBG > 400: call MD Patient taking differently: Inject 0-15 Units into the skin 3 (three) times daily. 0-15 Units, Subcutaneous, 3 times daily with meal CBG < 70: Implement Hypoglycemia meausers CBG 70 - 120: 0 units CBG 121 - 150: 2 units CBG 151 - 200: 3 units CBG 201 - 250: 5 units CBG 251 - 300: 8 units CBG 301 - 350: 11 units CBG 351 - 400: 15 units CBG > 400: call MD  05/14/23  Yes Ghimire, Werner Lean, MD  levothyroxine (SYNTHROID) 175 MCG tablet Take 1 tablet  (175 mcg total) by mouth daily. 05/14/23  Yes Ghimire, Werner Lean, MD  lidocaine (LIDODERM) 5 % Place 1 patch onto the skin as needed (pain).   Yes [provider]  lidocaine-prilocaine (EMLA) cream Apply 1 Application topically See admin instructions. On dialysis days (Tues,Thurs,Sat) 02/13/22  Yes [provider]  meclizine (ANTIVERT) 25 MG tablet Take 25 mg by mouth as needed for dizziness or nausea. 06/12/23  Yes [provider]  metoprolol tartrate (LOPRESSOR) 25 MG tablet Take 1.5 tablets (37.5 mg total) by mouth 2 (two) times daily. Patient taking differently: Take 25 mg by mouth 2 (two) times daily. 07/20/23  Yes Ghimire, Werner Lean, MD  lidocaine 4 % Place 2 patches onto the skin daily. Each lower side of back. Patient not taking: Reported on 08/20/2023 05/14/23   Maretta Bees, MD  Methoxy PEG-Epoetin Beta (MIRCERA IJ) Inject 1 Syringe into the skin every 14 (fourteen) days. 06/01/23 05/30/24  [provider]  midodrine (PROAMATINE) 5 MG tablet Take 3 tablets (15 mg total) by mouth every 8 (eight) hours. 07/20/23   Ghimire, Werner Lean, MD  Multiple Vitamins-Iron (MULTIVITAMINS WITH IRON) TABS tablet Take 1 tablet by mouth in the morning. 05/14/23   Ghimire, Werner Lean, MD  nitroGLYCERIN (NITROSTAT) 0.4 MG SL tablet Place 1 tablet (0.4 mg total) under the tongue every 5 (five) minutes as needed for chest pain. 07/06/23 07/05/24  Cyndi Bender, NP  Nutritional Supplements (FEEDING SUPPLEMENT, NEPRO CARB STEADY,) LIQD Take 237 mLs by mouth 2 (two) times daily between meals. 04/17/23   Joseph Art, DO  ondansetron (ZOFRAN-ODT) 4 MG disintegrating tablet Take 2 mg by mouth every 6 (six) hours as needed for nausea or vomiting. 07/10/23   [provider]  OXYGEN Inhale into the lungs.    [provider]  pantoprazole (PROTONIX) 40 MG tablet Take 1 tablet (40 mg total) by mouth 2 (two) times daily before a meal. 05/14/23   Ghimire, Werner Lean, MD  pregabalin  (LYRICA) 25 MG capsule Take 25 mg by mouth daily. 05/29/23   [provider]  rosuvastatin (CRESTOR) 20 MG tablet Take 1 tablet (20 mg total) by mouth daily. 07/06/23   Cyndi Bender, NP  sevelamer carbonate (RENVELA) 800 MG tablet Take 2 tablets (1,600 mg total) by mouth 3 (three) times daily with meals. 05/14/23   Ghimire, Werner Lean, MD  thiamine (VITAMIN B1) 100 MG tablet Take 1 tablet (100 mg total) by mouth in the morning. 05/14/23   Ghimire, Werner Lean, MD  umeclidinium bromide (INCRUSE ELLIPTA) 62.5 MCG/ACT AEPB Inhale 1 puff into the lungs daily. 05/14/23   Maretta Bees, MD    Labs on Admission:   CBC: Recent Labs  Lab 08/20/23 0456 08/20/23 0651 08/20/23 0657  WBC 7.1  --   --   NEUTROABS 5.8  --   --   HGB 9.1* 11.2* 10.9*  HCT 32.8* 33.0* 32.0*  MCV 109.3*  --   --   PLT 187  --   --     Basic Metabolic Panel: Recent Labs  Lab 08/20/23 0651 08/20/23 0657  NA 134* 135  K 4.9 4.7  CL 100  --   GLUCOSE 154*  --   BUN 62*  --   CREATININE 7.70*  --     Liver Function Tests: No results for input(s): "AST", "ALT", "ALKPHOS", "BILITOT", "PROT", "ALBUMIN" in  the last 168 hours. No results for input(s): "LIPASE", "AMYLASE" in the last 168 hours. No results for input(s): "AMMONIA" in the last 168 hours.  Cardiac Enzymes: No results for input(s): "CKTOTAL", "CKMB", "CKMBINDEX", "TROPONINI" in the last 168 hours.  BNP (last 3 results) Recent Labs    05/06/23 2111 07/11/23 1321 08/20/23 0456  BNP 617.7* 903.7* 1,345.3*    ProBNP (last 3 results) No results for input(s): "PROBNP" in the last 8760 hours.  CBG: Recent Labs  Lab 08/20/23 0817  GLUCAP 149*    Lipase     Component Value Date/Time   LIPASE 65 (H) 11/21/2021 1413     Urinalysis    Component Value Date/Time   COLORURINE YELLOW 11/27/2021 0956   APPEARANCEUR HAZY (A) 11/27/2021 0956   LABSPEC 1.012 11/27/2021 0956   PHURINE 5.0 11/27/2021 0956   GLUCOSEU NEGATIVE 11/27/2021 0956    HGBUR NEGATIVE 11/27/2021 0956   BILIRUBINUR NEGATIVE 11/27/2021 0956   KETONESUR NEGATIVE 11/27/2021 0956   PROTEINUR 30 (A) 11/27/2021 0956   UROBILINOGEN 0.2 08/04/2014 1409   NITRITE NEGATIVE 11/27/2021 0956   LEUKOCYTESUR NEGATIVE 11/27/2021 0956     Drugs of Abuse     Component Value Date/Time   LABOPIA NONE DETECTED 07/02/2021 1050   COCAINSCRNUR NONE DETECTED 07/02/2021 1050   LABBENZ NONE DETECTED 07/02/2021 1050   AMPHETMU NONE DETECTED 07/02/2021 1050   THCU NONE DETECTED 07/02/2021 1050   LABBARB NONE DETECTED 07/02/2021 1050      Radiological Exams on Admission: DG Chest Portable 1 View  Result Date: 08/20/2023 CLINICAL DATA:  Shortness of breath. EXAM: PORTABLE CHEST 1 VIEW COMPARISON:  Portable chest 07/16/2023, portable chest 07/12/2023. FINDINGS: Severe cardiomegaly is again noted. Central vascular prominence is seen but less pronounced than previously. There is no overt edema. Opacity is again noted in the retrocardiac area, which could be due to compressive atelectasis from the enlarged heart or could be due to left lower lobe consolidation. There is no measurable pleural effusion. The mediastinum is stable. Osteopenia and thoracic spondylosis. IMPRESSION: 1. Severe cardiomegaly with central vascular prominence but less pronounced than previously. No overt edema. 2. Opacity in the retrocardiac area could be due to compressive atelectasis from the enlarged heart or could be due to left lower lobe consolidation. Electronically Signed   By: Almira Bar M.D.   On: 08/20/2023 07:14     Signed, Lorin Glass, MD [facility] 08/20/2023

## 2023-08-20 NOTE — ED Triage Notes (Signed)
Pt. Presents to the ED BIBA from home for c/o respiratory distress. Pt. Was found at 80% on her baseline 3L. Diminished throughout per EMS. History of COPD.

## 2023-08-20 NOTE — Procedures (Signed)
I have reviewed the HD regimen and made appropriate changes.  [name] MD  [facility] 08/20/2023, 2:10 PM

## 2023-08-20 NOTE — Progress Notes (Signed)
Dear Doctor: This patient has been identified as a candidate for CVC line for the following reason (s): IV therapy over 48 hours, poor veins/poor circulatory system (CHF, COPD, emphysema, diabetes, steroid use, IV drug abuse, etc.), and restarts due to phlebitis and infiltration in 24 hours If you agree, please write an order for the indicated device.   Thank you for supporting the early vascular access assessment program.

## 2023-08-20 NOTE — ED Notes (Signed)
RT notified for ABG

## 2023-08-20 NOTE — Hospital Course (Signed)
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   At addition to ED patient is hemodynamically stable except borderline blood pressure 103/42 and O2 sat 92% on 5 L oxygen.  Pending VBG and respiratory panel.  Elevated BNP 1345.  Elevated troponin 86.  EKG normal sinus rhythm heart rate 83.  CBC stable H&H 91 and 32.  Awaiting chest x-ray official reading (to be it seems like that patient has cardiomegaly). -Dr. Daun Peacock reported that patient is stable and maintaining O2 sat 98% on 5 L.  Patient reported compliant with dialysis and last HD on 11/9. Hospitalist has been contacted for further evaluation and management of acute hypoxic respiratory failure possibly from volume overload.

## 2023-08-20 NOTE — Progress Notes (Signed)
Received patient in bed to unit.  Alert and oriented.  Informed consent signed and in chart.   TX duration:3:19  Patient didn't tolerate tx she was out of uf during tx. Bp dropped towards end of tx pt went out, pt tx terminated early extra saline bolus given. Pt back to baseline post tx. Transported back to the room  Alert, without acute distress.  Hand-off given to patient's nurse.   Access used: left AVG Access issues: none Total UF removed: 2.5L Medication(s) given: none   08/20/23 1728  Vitals  BP (!) 82/53  MAP (mmHg) (!) 64  BP Location Right Wrist  BP Method Automatic  Patient Position (if appropriate) Lying  Pulse Rate (!) 106  ECG Heart Rate (!) 108  Resp (!) 27  Oxygen Therapy  SpO2 97 %  O2 Device Nasal Cannula  O2 Flow Rate (L/min) 3 L/min  During Treatment Monitoring  Blood Flow Rate (mL/min) 0 mL/min  Arterial Pressure (mmHg) 43.23 mmHg  Venous Pressure (mmHg) 66.66 mmHg  TMP (mmHg) 26.87 mmHg  Ultrafiltration Rate (mL/min) 0 mL/min  Dialysate Flow Rate (mL/min) 299 ml/min  Dialysate Potassium Concentration 2  Dialysate Calcium Concentration 2.5  Duration of HD Treatment -hour(s) 3.31 hour(s)  Cumulative Fluid Removed (mL) per Treatment  2602.85  Intra-Hemodialysis Comments  (tx terminated with about 12 minutes remaing bp low slow to respond)  Dialysis Fluid Bolus Normal Saline  Bolus Amount (mL) 300 mL      Stefana Lodico S [facility]

## 2023-08-20 NOTE — Consult Note (Signed)
Renal Service Consult Note [facility]  [name] [name] 08/20/2023 [name], MD Requesting Physician: Dr. Pola Corn  Reason for Consult: ESRD pt w/  HPI: The patient is a 72 y.o. year-old w/ PMH as below who presented w/ SOB. Pt found at home by EMS w/ sats in the low 80s on her baseline 3 L O2.  Last HD was Sat 11/09.  In ED pt afeb, HR 80s, BP 110 /42,  RR 20s on 5L O2. Pt admitted. We are asked to see for dialysis.   Pt seen in ED.  CXR shows marked cardiomegaly and mild vasc congestion. Pt doesn't miss her HD sessions. No prod cough, fevers or chills.    ROS - denies CP, no joint pain, no HA, no blurry vision, no rash, no diarrhea, no nausea/ vomiting, no dysuria, no difficulty voiding   Past Medical History  Past Medical History:  Diagnosis Date   Anemia    Arthritis    Asthma    Chronic diastolic (congestive) heart failure (HCC)    Chronic kidney disease    Dialysis T-TH-Sat   Chronic pain syndrome    Class 2 obesity due to excess calories with body mass index (BMI) of 36.0 to 36.9 in adult    COPD (chronic obstructive pulmonary disease) (HCC)    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   Full dentures    GERD (gastroesophageal reflux disease)    Gout    History of hiatal hernia    History of transfusion    Hx MRSA infection    abscess left groin   Hyperlipidemia    Hypertension    Hypothyroid    Intractable nausea and vomiting 02/15/2021   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HCC)    Peripheral vascular disease (HCC)    Sleep apnea    UNABLE TO TOLERATE C PAP   Stress incontinence    TIA (transient ischemic attack)     X2 NO RESIDUAL PROBLEMS   Wears glasses    Past Surgical History  Past Surgical History:  Procedure Laterality Date   ABDOMINAL HYSTERECTOMY     AV FISTULA PLACEMENT Left 12/01/2021   Procedure: INSERTION OF LEFT ARM ARTERIOVENOUS (AV) GORE-TEX GRAFT;  Surgeon: Nada Libman, MD;  Location:  [facility] OR;  Service: Vascular;  Laterality: Left;   BACK SURGERY  2020   BASCILIC VEIN TRANSPOSITION Left 04/05/2020   Procedure: BASILIC VEIN TRANSPOSITION FIRST STAGE;  Surgeon: Chuck Hint, MD;  Location: [facility] OR;  Service: Vascular;  Laterality: Left;   BRONCHIAL BIOPSY  01/23/2022   Procedure: BRONCHIAL BIOPSIES;  Surgeon: Josephine Igo, DO;  Location: [facility] ENDOSCOPY;  Service: Pulmonary;;   BRONCHIAL BIOPSY  08/07/2022   Procedure: BRONCHIAL BIOPSIES;  Surgeon: Josephine Igo, DO;  Location: [facility] ENDOSCOPY;  Service: Pulmonary;;   BRONCHIAL BRUSHINGS  08/07/2022   Procedure: BRONCHIAL BRUSHINGS;  Surgeon: Josephine Igo, DO;  Location: [facility]OS[facility];  Service: Pulmonary;;   BRONCHIAL NEEDLE ASPIRATION BIOPSY  01/23/2022   Procedure: BRONCHIAL NEEDLE ASPIRATION BIOPSIES;  Surgeon: Josephine Igo, DO;  Location: [facility]OS[facility];  Service: Pulmonary;;   BRONCHIAL NEEDLE ASPIRATION BIOPSY  08/07/2022   Procedure: BRONCHIAL NEEDLE ASPIRATION BIOPSIES;  Surgeon: Josephine Igo, DO;  Location: [facility]OS[facility];  Service: Pulmonary;;   COLON SURGERY  1995   DUE TO POLYP   DILATION AND CURETTAGE OF UTERUS     ENDOBRONCHIAL ULTRASOUND  01/23/2022   Procedure: ENDOBRONCHIAL ULTRASOUND;  Surgeon: Josephine Igo, DO;  Location[facility] ENDOSCOPY;  Service: Pulmonary;;   ENTEROSCOPY N/A 07/10/2021   Procedure: ENTEROSCOPY;  Surgeon: Kerin Salen, MD;  Location: [facility] ENDOSCOPY;  Service: Gastroenterology;  Laterality: N/A;   ESOPHAGOGASTRODUODENOSCOPY N/A 01/25/2021   Procedure: ESOPHAGOGASTRODUODENOSCOPY (EGD);  Surgeon: Vida Rigger, MD;  Location: [facility] ENDOSCOPY;  Service: Endoscopy;  Laterality: N/A;   FINE NEEDLE ASPIRATION  01/23/2022   Procedure: FINE NEEDLE ASPIRATION (FNA) LINEAR;  Surgeon: Josephine Igo, DO;  Location: [facility];  Service: Pulmonary;;   FOREARM FRACTURE SURGERY     Left arm   GIVENS CAPSULE STUDY N/A 07/07/2021   Procedure: GIVENS CAPSULE STUDY;  Surgeon: Vida Rigger, MD;   Location: [facility] ENDOSCOPY;  Service: Endoscopy;  Laterality: N/A;   HERNIA REPAIR     w/ mesh   HOT HEMOSTASIS N/A 07/10/2021   Procedure: HOT HEMOSTASIS (ARGON PLASMA COAGULATION/BICAP);  Surgeon: Kerin Salen, MD;  Location: [facility] ENDOSCOPY;  Service: Gastroenterology;  Laterality: N/A;   INCISION AND DRAINAGE ABSCESS Left 02/04/2013   Procedure: INCISION AND DRAINAGE LEFT BUTTOCK ABSCESS; INCISION AND DRAINAGE LEFT BREAST ABSCESS;  Surgeon: Shelly Rubenstein, MD;  Location: [facility] OR;  Service: General;  Laterality: Left;   INCISION AND DRAINAGE ABSCESS N/A 02/12/2013   Procedure: INCISION AND DEBRIDEMENT BUTTOCK WOUND ;  Surgeon: Wilmon Arms. Corliss Skains, MD;  Location: [facility] OR;  Service: General;  Laterality: N/A;   INCISION AND DRAINAGE ABSCESS N/A 02/14/2013   Procedure: INCISION AND DRAINAGE/DRESSING CHANGE;  Surgeon: Shelly Rubenstein, MD;  Location: [facility] OR;  Service: General;  Laterality: N/A;   INCISION AND DRAINAGE ABSCESS N/A 03/21/2015   Procedure: INCISION AND DRAINAGE PUBIC ABSCESS;  Surgeon: Glenna Fellows, MD;  Location: [facility] ORS;  Service: General;  Laterality: N/A;   INCISION AND DRAINAGE PERIRECTAL ABSCESS Left 02/10/2013   Procedure: IRRIGATION AND DEBRIDEMENT OF BUTTOCK/PERINEAL ABSCESS;  Surgeon: Wilmon Arms. Corliss Skains, MD;  Location: [facility] OR;  Service: General;  Laterality: Left;   INCISION AND DRAINAGE PERIRECTAL ABSCESS N/A 02/16/2013   Procedure: IRRIGATION AND DEBRIDEMENT PERINEAL ABSCESS;  Surgeon: Liz Malady, MD;  Location: [facility];  Service: General;  Laterality: N/A;   IR FLUORO GUIDE CV LINE RIGHT  11/30/2021   IR US GUIDE VASC ACCESS RIGHT  11/30/2021   IRRIGATION AND DEBRIDEMENT ABSCESS N/A 02/06/2013   Procedure: IRRIGATION AND DEBRIDEMENT BUTTOCK ABSCESS AND DRESSING CHANGE;  Surgeon: Shelly Rubenstein, MD;  Location: [facility];  Service: General;  Laterality: N/A;   IRRIGATION AND DEBRIDEMENT ABSCESS Left 02/08/2013   Procedure: IRRIGATION AND DEBRIDEMENT ABSCESS/DRESSING CHANGE;  Surgeon: Cherylynn Ridges, MD;  Location: [facility] OR;  Service: General;  Laterality: Left;   JOINT REPLACEMENT  2010 / 2012    LAPAROSCOPIC CHOLECYSTECTOMY     LEFT HEART CATH AND CORONARY ANGIOGRAPHY N/A 07/05/2023   Procedure: LEFT HEART CATH AND CORONARY ANGIOGRAPHY;  Surgeon: Lennette Bihari, MD;  [facility]: [facility] INVAS[facility] CV LAB;  Service: Cardiovascular;  Laterality: N/A;   LEFT HEART CATHETERIZATION WITH CORONARY ANGIOGRAM N/A 07/02/2014   Procedure: LEFT HEART CATHETERIZATION WITH CORONARY ANGIOGRAM;  Surgeon: Runell Gess, MD;  Location: [facility] CATH LAB;  Service: Cardiovascular;  Laterality: N/A;   LOWER EXTREMITY ANGIOGRAM Right 04/20/2016   Procedure: Lower Extremity Angiogram;  Surgeon: Sherren Kerns, MD;  Location: [facility] INVASIVE CV LAB;  Service: Cardiovascular;  Laterality: Right;   MULTIPLE TOOTH EXTRACTIONS     PERIPHERAL VASCULAR CATHETERIZATION N/A 04/20/2016   Procedure: Abdominal Aortogram;  Surgeon: Sherren Kerns,  MD;  [facility]VAS[facility] CV LAB;  Service: Cardiovascular;  Laterality: N/A;   PERIPHERAL VASCULAR CATHETERIZATION Right 04/20/2016   Procedure: Peripheral Vascular Intervention;  Surgeon: Sherren Kerns, MD;  Location: [facility] INVASIVE CV LAB;  Service: Cardiovascular;  Laterality: Right;  popiteal   RIGHT HEART CATH N/A 10/27/2018   Procedure: RIGHT HEART CATH;  Surgeon: Laurey Morale, MD;  Location: [facility] INVASIVE CV LAB;  Service: Cardiovascular;  Laterality: N/A;   RIGHT HEART CATH N/A 03/20/2019   Procedure: RIGHT HEART CATH;  Surgeon: Laurey Morale, MD;  Location: [facility] INVASIVE CV LAB;  Service: Cardiovascular;  Laterality: N/A;   THYROIDECTOMY     TOTAL HIP REVISION Right 08/11/2014   Procedure: RIGHT ACETABULAR REVISION;  Surgeon: Loanne Drilling, MD;  Location: [facility] ORS;  Service: Orthopedics;  Laterality: Right;   VASCULAR SURGERY     VIDEO BRONCHOSCOPY WITH ENDOBRONCHIAL ULTRASOUND Bilateral 08/07/2022   Procedure: VIDEO BRONCHOSCOPY WITH ENDOBRONCHIAL ULTRASOUND;  Surgeon: Josephine Igo, DO;  Location: [facility] ENDOS[facility];  Service: Pulmonary;  Laterality: Bilateral;   VIDEO BRONCHOSCOPY WITH RADIAL ENDOBRONCHIAL ULTRASOUND  01/23/2022   Procedure: RADIAL ENDOBRONCHIAL ULTRASOUND;  Surgeon: Josephine Igo, DO;  Location: [facility] ENDOSCOPY;  Service: Pulmonary;;   Family History  Family History  Problem Relation Age of Onset   Diabetes Brother    Cardiomyopathy Mother    Heart disease Mother    Cancer Father    Social History  reports that she quit smoking about 22 months ago. Her smoking use included cigarettes. She started smoking about 41 years ago. She has a 10 pack-year smoking history. She has been exposed to tobacco smoke. She has quit using smokeless tobacco. She reports current alcohol use of about 1.0 standard drink of alcohol per week. She reports that she does not use drugs. Allergies  Allergies  Allergen Reactions   Nebivolol Swelling    Chest pain (reaction to Bystolic)   Zestril [Lisinopril] Swelling and Other (See Comments)   Ace Inhibitors Swelling and Other (See Comments)    Tongue swell   Morphine And Codeine Itching   Home medications Prior to Admission medications   Medication Sig Start Date End Date Taking? Authorizing Provider  acetaminophen (TYLENOL) 500 MG tablet Take 1 tablet (500 mg total) by mouth See admin instructions. Take 1,000mg  (2 tablets) by mouth twice a day in addition to 1,000mg  (2 tablets) twice daily as needed for pain. Patient taking differently: Take 1,000 mg by mouth in the morning and at bedtime. 05/14/23  Yes Ghimire, Werner Lean, MD  albuterol (PROVENTIL) (2.5 MG/3ML) 0.083% nebulizer solution Take 3 mLs (2.5 mg total) by nebulization every 6 (six) hours as needed for wheezing or shortness of breath. Patient taking differently: Take 2.5 mg by nebulization in the morning and at bedtime. 05/14/23  Yes Ghimire, Werner Lean, MD  albuterol (VENTOLIN HFA) 108 (90 Base) MCG/ACT inhaler Inhale 2 puffs into the lungs every 6 (six) hours as  needed for shortness of breath (COPD). Patient taking differently: Inhale 2 puffs into the lungs in the morning and at bedtime. 05/14/23  Yes Ghimire, Werner Lean, MD  allopurinol (ZYLOPRIM) 100 MG tablet Take 1 tablet (100 mg total) by mouth daily. 05/14/23  Yes Ghimire, Werner Lean, MD  amiodarone (PACERONE) 200 MG tablet Take 2 tablets (400 mg total) by mouth 2 (two) times daily for 7 days, THEN 2 tablets (400 mg total) daily for 7 days, THEN 1 tablet (200 mg total) daily. 07/20/23 09/20/23 Yes Ghimire,  Werner Lean, MD  apixaban (ELIQUIS) 5 MG TABS tablet Take 1 tablet (5 mg total) by mouth 2 (two) times daily. 07/20/23  Yes Ghimire, Werner Lean, MD  budesonide-formoterol (SYMBICORT) 160-4.5 MCG/ACT inhaler Inhale 2 puffs into the lungs in the morning and at bedtime. 05/14/23  Yes Ghimire, Werner Lean, MD  diclofenac Sodium (VOLTAREN) 1 % GEL Apply 2 g topically 4 (four) times daily. Patient taking differently: Apply 1 Application topically daily. 04/17/23  Yes Marlin Canary U, DO  docusate sodium (COLACE) 100 MG capsule Take 1 capsule (100 mg total) by mouth daily. Patient taking differently: Take 100 mg by mouth 2 (two) times daily. 05/14/23  Yes Ghimire, Werner Lean, MD  fluticasone (FLONASE) 50 MCG/ACT nasal spray Place 1 spray into both nostrils daily. Patient taking differently: Place 2 sprays into both nostrils daily. 05/14/23  Yes Ghimire, Werner Lean, MD  levothyroxine (SYNTHROID) 175 MCG tablet Take 1 tablet (175 mcg total) by mouth daily. 05/14/23  Yes Ghimire, Werner Lean, MD  lidocaine (LIDODERM) 5 % Place 1 patch onto the skin as needed (pain).   Yes [provider]  lidocaine-prilocaine (EMLA) cream Apply 1 Application topically See admin instructions. On dialysis days (Tues,Thurs,Sat) 02/13/22  Yes [provider]  meclizine (ANTIVERT) 25 MG tablet Take 25 mg by mouth as needed for dizziness or nausea. 06/12/23  Yes [provider]  insulin lispro (HUMALOG KWIKPEN) 100 UNIT/ML KwikPen  0-15 Units, Subcutaneous, 3 times daily with meal CBG < 70: Implement Hypoglycemia meausers CBG 70 - 120: 0 units CBG 121 - 150: 2 units CBG 151 - 200: 3 units CBG 201 - 250: 5 units CBG 251 - 300: 8 units CBG 301 - 350: 11 units CBG 351 - 400: 15 units CBG > 400: call MD Patient taking differently: Inject 0-15 Units into the skin 3 (three) times daily. 0-15 Units, Subcutaneous, 3 times daily with meal CBG < 70: Implement Hypoglycemia meausers CBG 70 - 120: 0 units CBG 121 - 150: 2 units CBG 151 - 200: 3 units CBG 201 - 250: 5 units CBG 251 - 300: 8 units CBG 301 - 350: 11 units CBG 351 - 400: 15 units CBG > 400: call MD 05/14/23   Maretta Bees, MD  lidocaine 4 % Place 2 patches onto the skin daily. Each lower side of back. Patient not taking: Reported on 08/20/2023 05/14/23   Maretta Bees, MD  Methoxy PEG-Epoetin Beta (MIRCERA IJ) Inject 1 Syringe into the skin every 14 (fourteen) days. 06/01/23 05/30/24  [provider]  metoprolol tartrate (LOPRESSOR) 25 MG tablet Take 1.5 tablets (37.5 mg total) by mouth 2 (two) times daily. Patient taking differently: Take 25 mg by mouth 2 (two) times daily. 07/20/23   Ghimire, Werner Lean, MD  midodrine (PROAMATINE) 5 MG tablet Take 3 tablets (15 mg total) by mouth every 8 (eight) hours. 07/20/23   Ghimire, Werner Lean, MD  Multiple Vitamins-Iron (MULTIVITAMINS WITH IRON) TABS tablet Take 1 tablet by mouth in the morning. 05/14/23   Ghimire, Werner Lean, MD  nitroGLYCERIN (NITROSTAT) 0.4 MG SL tablet Place 1 tablet (0.4 mg total) under the tongue every 5 (five) minutes as needed for chest pain. 07/06/23 07/05/24  Cyndi Bender, NP  Nutritional Supplements (FEEDING SUPPLEMENT, NEPRO CARB STEADY,) LIQD Take 237 mLs by mouth 2 (two) times daily between meals. 04/17/23   Joseph Art, DO  ondansetron (ZOFRAN-ODT) 4 MG disintegrating tablet Take 2 mg by mouth every 6 (six) hours as needed  for nausea or vomiting. 07/10/23   [provider]  OXYGEN Inhale  into the lungs.    [provider]  pantoprazole (PROTONIX) 40 MG tablet Take 1 tablet (40 mg total) by mouth 2 (two) times daily before a meal. 05/14/23   Ghimire, Werner Lean, MD  pregabalin (LYRICA) 25 MG capsule Take 25 mg by mouth daily. 05/29/23   [provider]  rosuvastatin (CRESTOR) 20 MG tablet Take 1 tablet (20 mg total) by mouth daily. 07/06/23   Cyndi Bender, NP  sevelamer carbonate (RENVELA) 800 MG tablet Take 2 tablets (1,600 mg total) by mouth 3 (three) times daily with meals. 05/14/23   Ghimire, Werner Lean, MD  thiamine (VITAMIN B1) 100 MG tablet Take 1 tablet (100 mg total) by mouth in the morning. 05/14/23   Ghimire, Werner Lean, MD  umeclidinium bromide (INCRUSE ELLIPTA) 62.5 MCG/ACT AEPB Inhale 1 puff into the lungs daily. 05/14/23   Maretta Bees, MD     Vitals:   08/20/23 0356 08/20/23 0600 08/20/23 0747 08/20/23 0823  BP: (!) 103/42 137/89 (!) 147/70 (!) 137/114  Pulse: 87 84 84   Resp: 19 (!) 28 (!) 25 (!) 24  Temp:    99.1 F (37.3 C)  TempSrc:    Oral  SpO2: 92% 95% 100%    Exam Gen alert, no distress, deconditioned, bed bound > 1 yrs No rash, cyanosis or gangrene Sclera anicteric, throat clear  No jvd or bruits Chest clear bilat to bases, no rales/ wheezing RRR no RG Abd soft ntnd no mass or ascites +bs GU defer MS no joint effusions or deformity Ext 1+ bilat pretib edema Neuro is alert, Ox 3 , nf    LUA AVG+bruit       Renal-related home meds: - metoprolol 25 bid - home O2 3 L - eliquis 5 bid - midodrine 15 tid - lyrica 25 qam - renvela 2 ac tid   OP HD: [facility] TTS  4h   400/600   97.5kg  2/2 bath  LUA AVG  Heparin 2000 - last OP HD 11/09, post wt 101.3kg (+4 kg) - coming off 2-4kg over the last 2 wks - hectorol 3 mcg  - mircera 100 mcg IV q 2, last 11/2, due 11/16   Assessment/ Plan: Acute on chronic hypoxic resp failure - due possibly to pna (LLL retrocardiac) and/or vol overload. BNP is up at 1345. Has not been getting to dry  wt last 2 wks. Plan iHD today w/ max UF for volume reduction.  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 Has not missed HD. HD today as above.  BP - bp's wnl, takes midodrine 15 tid at home. Cont here.  Volume - + LE edema on exam, also as above Anemia of eskd - Hb 11, next esa due on 11/16. Follow.  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 Will get labs w/ iHD.  COPD - on home O2 3 L [name] [name]      Rob Miia Blanks  MD [facility] 08/20/2023, 11:10 AM  Recent Labs  Lab 08/20/23 0651 08/20/23 0657  HGB 11.2* 10.9*  CREATININE 7.70*  --   K 4.9 4.7   Inpatient medications:  allopurinol  100 mg Oral Daily   amiodarone  400 mg Oral Daily   apixaban  5 mg Oral BID   fluticasone furoate-vilanterol  1 puff Inhalation Daily   insulin aspart  0-5 Units Subcutaneous QHS   insulin aspart  0-9 Units Subcutaneous TID WC  levothyroxine  175 mcg Oral Q0600   midodrine  15 mg Oral Q8H   pantoprazole  40 mg Oral BID AC   pregabalin  25 mg Oral Daily   rosuvastatin  20 mg Oral Daily   sevelamer carbonate  1,600 mg Oral TID WC   umeclidinium bromide  1 puff Inhalation Daily    ampicillin-sulbactam (UNASYN) IV     acetaminophen **OR** acetaminophen, albuterol, hydrALAZINE, HYDROmorphone (DILAUDID) injection, meclizine, oxyCODONE

## 2023-08-21 DIAGNOSIS — J9601 Acute respiratory failure with hypoxia: Secondary | ICD-10-CM | POA: Diagnosis not present

## 2023-08-21 LAB — GLUCOSE, CAPILLARY
Glucose-Capillary: 125 mg/dL — ABNORMAL HIGH (ref 70–99)
Glucose-Capillary: 137 mg/dL — ABNORMAL HIGH (ref 70–99)
Glucose-Capillary: 140 mg/dL — ABNORMAL HIGH (ref 70–99)
Glucose-Capillary: 165 mg/dL — ABNORMAL HIGH (ref 70–99)

## 2023-08-21 LAB — BASIC METABOLIC PANEL
Anion gap: 14 (ref 5–15)
BUN: 36 mg/dL — ABNORMAL HIGH (ref 8–23)
CO2: 26 mmol/L (ref 22–32)
Calcium: 9.4 mg/dL (ref 8.9–10.3)
Chloride: 94 mmol/L — ABNORMAL LOW (ref 98–111)
Creatinine, Ser: 5.84 mg/dL — ABNORMAL HIGH (ref 0.44–1.00)
GFR, Estimated: 7 mL/min — ABNORMAL LOW (ref >60)
Glucose, Bld: 128 mg/dL — ABNORMAL HIGH (ref 70–99)
Potassium: 4.7 mmol/L (ref 3.5–5.1)
Sodium: 134 mmol/L — ABNORMAL LOW (ref 135–145)

## 2023-08-21 LAB — CBC
HCT: 35.4 % — ABNORMAL LOW (ref 36.0–46.0)
Hemoglobin: 10.2 g/dL — ABNORMAL LOW (ref 12.0–15.0)
MCH: 31.1 pg (ref 26.0–34.0)
MCHC: 28.8 g/dL — ABNORMAL LOW (ref 30.0–36.0)
MCV: 107.9 fL — ABNORMAL HIGH (ref 80.0–100.0)
Platelets: 204 10*3/uL (ref 150–400)
RBC: 3.28 MIL/uL — ABNORMAL LOW (ref 3.87–5.11)
RDW: 15.9 % — ABNORMAL HIGH (ref 11.5–15.5)
WBC: 6.4 10*3/uL (ref 4.0–10.5)
nRBC: 5.2 % — ABNORMAL HIGH (ref 0.0–0.2)

## 2023-08-21 LAB — PHOSPHORUS: Phosphorus: 7 mg/dL — ABNORMAL HIGH (ref 2.5–4.6)

## 2023-08-21 LAB — HEPATITIS B SURFACE ANTIBODY, QUANTITATIVE: Hep B S AB Quant (Post): <3.5 m[IU]/mL — ABNORMAL LOW

## 2023-08-21 MED ORDER — AMOXICILLIN-POT CLAVULANATE 500-125 MG PO TABS
1.0000 | ORAL_TABLET | Freq: Every day | ORAL | Status: DC
  Administered 2023-08-21 – 2023-08-22 (×2): 1 via ORAL
  Filled 2023-08-21 (×2): qty 1

## 2023-08-21 MED ORDER — METOPROLOL TARTRATE 25 MG PO TABS
37.5000 mg | ORAL_TABLET | Freq: Two times a day (BID) | ORAL | Status: DC
  Administered 2023-08-21: 37.5 mg via ORAL
  Filled 2023-08-21: qty 1

## 2023-08-21 MED ORDER — MIDODRINE HCL 5 MG PO TABS
10.0000 mg | ORAL_TABLET | Freq: Three times a day (TID) | ORAL | Status: DC
  Administered 2023-08-21 – 2023-08-30 (×24): 10 mg via ORAL
  Filled 2023-08-21 (×23): qty 2

## 2023-08-21 MED ORDER — CHLORHEXIDINE GLUCONATE CLOTH 2 % EX PADS
6.0000 | MEDICATED_PAD | Freq: Every day | CUTANEOUS | Status: DC
  Administered 2023-08-22 – 2023-08-28 (×7): 6 via TOPICAL

## 2023-08-21 MED ORDER — CLOPIDOGREL BISULFATE 75 MG PO TABS
75.0000 mg | ORAL_TABLET | Freq: Every day | ORAL | Status: DC
  Administered 2023-08-21: 75 mg via ORAL
  Filled 2023-08-21: qty 1

## 2023-08-21 MED ORDER — MIDODRINE HCL 5 MG PO TABS: 10.0000 mg | ORAL_TABLET | Freq: Three times a day (TID) | ORAL | Status: DC

## 2023-08-21 NOTE — Evaluation (Signed)
Clinical/Bedside Swallow Evaluation Patient Details  Name: [name] [name] MRN: [id number] Date of Birth: [birth year]  Today's Date: 08/21/2023 Time: SLP Start Time (ACUTE ONLY): 1629 SLP Stop Time (ACUTE ONLY): 1647 SLP Time Calculation (min) (ACUTE ONLY): 18 min  Past Medical History:  Past Medical History:  Diagnosis Date   Anemia    Arthritis    Asthma    Chronic diastolic (congestive) heart failure (HCC)    Chronic kidney disease    Dialysis T-TH-Sat   Chronic pain syndrome    Class 2 obesity due to excess calories with body mass index (BMI) of 36.0 to 36.9 in adult    COPD (chronic obstructive pulmonary disease) (HCC)    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   Full dentures    GERD (gastroesophageal reflux disease)    Gout    History of hiatal hernia    History of transfusion    Hx MRSA infection    abscess left groin   Hyperlipidemia    Hypertension    Hypothyroid    Intractable nausea and vomiting 02/15/2021   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HCC)    Peripheral vascular disease (HCC)    Sleep apnea    UNABLE TO TOLERATE C PAP   Stress incontinence    TIA (transient ischemic attack)     X2 NO RESIDUAL PROBLEMS   Wears glasses    Past Surgical History:  Past Surgical History:  Procedure Laterality Date   ABDOMINAL HYSTERECTOMY     AV FISTULA PLACEMENT Left 12/01/2021   Procedure: INSERTION OF LEFT ARM ARTERIOVENOUS (AV) GORE-TEX GRAFT;  Surgeon: Nada Libman, MD;  Location: [facility] OR;  Service: Vascular;  Laterality: Left;   BACK SURGERY  2020   BASCILIC VEIN TRANSPOSITION Left 04/05/2020   Procedure: BASILIC VEIN TRANSPOSITION FIRST STAGE;  Surgeon: Chuck Hint, MD;  Location: [facility] OR;  Service: Vascular;  Laterality: Left;   BRONCHIAL BIOPSY  01/23/2022   Procedure: BRONCHIAL BIOPSIES;  Surgeon: Josephine Igo, DO;  Location: [facility] ENDOSCOPY;  Service: Pulmonary;;   BRONCHIAL BIOPSY  08/07/2022   Procedure: BRONCHIAL  BIOPSIES;  Surgeon: Josephine Igo, DO;  Location: [facility] ENDOSCOPY;  Service: Pulmonary;;   BRONCHIAL BRUSHINGS  08/07/2022   Procedure: BRONCHIAL BRUSHINGS;  Surgeon: Josephine Igo, DO;  [facility]: [facility] ENDOS[facility];  Service: Pulmonary;;   BRONCHIAL NEEDLE ASPIRATION BIOPSY  01/23/2022   Procedure: BRONCHIAL NEEDLE ASPIRATION BIOPSIES;  Surgeon: Josephine Igo, DO;  [facility]: [facility] ENDOS[facility];  Service: Pulmonary;;   BRONCHIAL NEEDLE ASPIRATION BIOPSY  08/07/2022   Procedure: BRONCHIAL NEEDLE ASPIRATION BIOPSIES;  Surgeon: Josephine Igo, DO;  [facility]: [facility]OS[facility];  Service: Pulmonary;;   COLON SURGERY  1995   DUE TO POLYP   DILATION AND CURETTAGE OF UTERUS     ENDOBRONCHIAL ULTRASOUND  01/23/2022   Procedure: ENDOBRONCHIAL ULTRASOUND;  Surgeon: Josephine Igo, DO;  [facility]: [facility];  Service: Pulmonary;;   ENTEROSCOPY N/A 07/10/2021   Procedure: ENTEROSCOPY;  Surgeon: Kerin Salen, MD;  Location: [facility] ENDOSCOPY;  Service: Gastroenterology;  Laterality: N/A;   ESOPHAGOGASTRODUODENOSCOPY N/A 01/25/2021   Procedure: ESOPHAGOGASTRODUODENOSCOPY (EGD);  Surgeon: Vida Rigger, MD;  Location: [facility] ENDOSCOPY;  Service: Endoscopy;  Laterality: N/A;   FINE NEEDLE ASPIRATION  01/23/2022   Procedure: FINE NEEDLE ASPIRATION (FNA) LINEAR;  Surgeon: Josephine Igo, DO;  [facility];  Service: Pulmonary;;   FOREARM FRACTURE SURGERY     Left arm   GIVENS CAPSULE STUDY N/A  07/07/2021   Procedure: GIVENS CAPSULE STUDY;  Surgeon: Vida Rigger, MD;  Location: [facility] ENDOSCOPY;  Service: Endoscopy;  Laterality: N/A;   HERNIA REPAIR     w/ mesh   HOT HEMOSTASIS N/A 07/10/2021   Procedure: HOT HEMOSTASIS (ARGON PLASMA COAGULATION/BICAP);  Surgeon: Kerin Salen, MD;  Location: [facility] ENDOSCOPY;  Service: Gastroenterology;  Laterality: N/A;   INCISION AND DRAINAGE ABSCESS Left 02/04/2013   Procedure: INCISION AND DRAINAGE LEFT BUTTOCK ABSCESS; INCISION AND DRAINAGE LEFT BREAST ABSCESS;  Surgeon: Shelly Rubenstein,  MD;  Location: [facility] OR;  Service: General;  Laterality: Left;   INCISION AND DRAINAGE ABSCESS N/A 02/12/2013   Procedure: INCISION AND DEBRIDEMENT BUTTOCK WOUND ;  Surgeon: Wilmon Arms. Corliss Skains, MD;  Location: [facility];  Service: General;  Laterality: N/A;   INCISION AND DRAINAGE ABSCESS N/A 02/14/2013   Procedure: INCISION AND DRAINAGE/DRESSING CHANGE;  Surgeon: Shelly Rubenstein, MD;  [facility]: [facility] OR;  Service: General;  Laterality: N/A;   INCISION AND DRAINAGE ABSCESS N/A 03/21/2015   Procedure: INCISION AND DRAINAGE PUBIC ABSCESS;  Surgeon: Glenna Fellows, MD;  Location: [facility] ORS;  Service: General;  Laterality: N/A;   INCISION AND DRAINAGE PERIRECTAL ABSCESS Left 02/10/2013   Procedure: IRRIGATION AND DEBRIDEMENT OF BUTTOCK/PERINEAL ABSCESS;  Surgeon: Wilmon Arms. Corliss Skains, MD;  Location: [facility] OR;  Service: General;  Laterality: Left;   INCISION AND DRAINAGE PERIRECTAL ABSCESS N/A 02/16/2013   Procedure: IRRIGATION AND DEBRIDEMENT PERINEAL ABSCESS;  Surgeon: Liz Malady, MD;  [facility]: [facility];  Service: General;  Laterality: N/A;   IR FLUORO GUIDE CV LINE RIGHT  11/30/2021   IR US GUIDE VASC ACCESS RIGHT  11/30/2021   IRRIGATION AND DEBRIDEMENT ABSCESS N/A 02/06/2013   Procedure: IRRIGATION AND DEBRIDEMENT BUTTOCK ABSCESS AND DRESSING CHANGE;  Surgeon: Shelly Rubenstein, MD;  [facility];  Service: General;  Laterality: N/A;   IRRIGATION AND DEBRIDEMENT ABSCESS Left 02/08/2013   Procedure: IRRIGATION AND DEBRIDEMENT ABSCESS/DRESSING CHANGE;  Surgeon: Cherylynn Ridges, MD;  [facility]: [facility] OR;  Service: General;  Laterality: Left;   JOINT REPLACEMENT  2010 / 2012    LAPAROSCOPIC CHOLECYSTECTOMY     LEFT HEART CATH AND CORONARY ANGIOGRAPHY N/A 07/05/2023   Procedure: LEFT HEART CATH AND CORONARY ANGIOGRAPHY;  Surgeon: Lennette Bihari, MD;  [facility] INVASIVE CV LAB;  Service: Cardiovascular;  Laterality: N/A;   LEFT HEART CATHETERIZATION WITH CORONARY ANGIOGRAM N/A 07/02/2014   Procedure: LEFT HEART  CATHETERIZATION WITH CORONARY ANGIOGRAM;  Surgeon: Runell Gess, MD;  Location: [facility] CATH LAB;  Service: Cardiovascular;  Laterality: N/A;   LOWER EXTREMITY ANGIOGRAM Right 04/20/2016   Procedure: Lower Extremity Angiogram;  Surgeon: Sherren Kerns, MD;  Location: [facility] INVASIVE CV LAB;  Service: Cardiovascular;  Laterality: Right;   MULTIPLE TOOTH EXTRACTIONS     PERIPHERAL VASCULAR CATHETERIZATION N/A 04/20/2016   Procedure: Abdominal Aortogram;  Surgeon: Sherren Kerns, MD;  Location: [facility] INVASIVE CV LAB;  Service: Cardiovascular;  Laterality: N/A;   PERIPHERAL VASCULAR CATHETERIZATION Right 04/20/2016   Procedure: Peripheral Vascular Intervention;  Surgeon: Sherren Kerns, MD;  Location: [facility] INVASIVE CV LAB;  Service: Cardiovascular;  Laterality: Right;  popiteal   RIGHT HEART CATH N/A 10/27/2018   Procedure: RIGHT HEART CATH;  Surgeon: Laurey Morale, MD;  Location: [facility] INVASIVE CV LAB;  Service: Cardiovascular;  Laterality: N/A;   RIGHT HEART CATH N/A 03/20/2019   Procedure: RIGHT HEART CATH;  Surgeon: Laurey Morale, MD;  Location: [facility] INVASIVE CV LAB;  Service: Cardiovascular;  Laterality: N/A;   THYROIDECTOMY     TOTAL HIP REVISION Right 08/11/2014   Procedure: RIGHT ACETABULAR REVISION;  Surgeon: Loanne Drilling, MD;  Location: [facility] ORS;  Service: Orthopedics;  Laterality: Right;   VASCULAR SURGERY     VIDEO BRONCHOSCOPY WITH ENDOBRONCHIAL ULTRASOUND Bilateral 08/07/2022   Procedure: VIDEO BRONCHOSCOPY WITH ENDOBRONCHIAL ULTRASOUND;  Surgeon: Josephine Igo, DO;  Location: [facility] ENDOS[facility];  Service: Pulmonary;  Laterality: Bilateral;   VIDEO BRONCHOSCOPY WITH RADIAL ENDOBRONCHIAL ULTRASOUND  01/23/2022   Procedure: RADIAL ENDOBRONCHIAL ULTRASOUND;  Surgeon: Josephine Igo, DO;  Location: [facility] ENDOSCOPY;  Service: Pulmonary;;   HPI:  [name] [name] is a 72 yo female presenting to ED 11/12 with respiratory distress. Recently admitted 10/3-10/12 for decompensated respiratory failure s/p  septic shock. PMH includes ESRD on HD, OSA, T2DM, HTN, HLD, paroxysmal A-fib on Eliquis, CHF with EF 35-40%, CAD, PAD, TIA, COPD on 3L O2 at baseline, chronic anemia, chronic pain syndrome, GERD, hypothyroidism, non-small cell lung cancer s/p curative radiation    Assessment / Plan / Recommendation  Clinical Impression  Pt reports coughing during meals with both solids and liquids. She endorses a history of esophageal reflux. Observed pt with trials of thin liquids and purees, after which she reports a globus sensation. Given regular solids, pt began gagging and regurgitating for minutes after the intial swallow. Suspect pt's most significant risk for aspiration is post-prandial. Recommend continuing current diet as tolerated. May consider GI involvement. Given no s/s of oropharyngeal dysphagia, SLP will s/o at this time. SLP Visit Diagnosis: Dysphagia, unspecified (R13.10)    Aspiration Risk  Moderate aspiration risk    Diet Recommendation Regular;Thin liquid    Liquid Administration via: Cup;Straw Medication Administration: Whole meds with liquid Supervision: Patient able to self feed;Full supervision/cueing for compensatory strategies Compensations: Minimize environmental distractions;Slow rate;Small sips/bites Postural Changes: Seated upright at 90 degrees;Remain upright for at least 30 minutes after po intake    Other  Recommendations Recommended Consults: Consider GI evaluation;Consider esophageal assessment Oral Care Recommendations: Oral care QID;Staff/trained caregiver to provide oral care    Recommendations for follow up therapy are one component of a multi-disciplinary discharge planning process, led by the attending physician.  Recommendations may be updated based on patient status, additional functional criteria and insurance authorization.  Follow up Recommendations No SLP follow up      Assistance Recommended at Discharge    Functional Status Assessment Patient has not had  a recent decline in their functional status  Frequency and Duration            Prognosis Prognosis for improved oropharyngeal function: Good      Swallow Study   General HPI: [name] [name] is a 72 yo female presenting to ED 11/12 with respiratory distress. Recently admitted 10/3-10/12 for decompensated respiratory failure s/p septic shock. PMH includes ESRD on HD, OSA, T2DM, HTN, HLD, paroxysmal A-fib on Eliquis, CHF with EF 35-40%, CAD, PAD, TIA, COPD on 3L O2 at baseline, chronic anemia, chronic pain syndrome, GERD, hypothyroidism, non-small cell lung cancer s/p curative radiation Type of Study: Bedside Swallow Evaluation Previous Swallow Assessment: none in chart Diet Prior to this Study: Regular;Thin liquids (Level 0) Temperature Spikes Noted: No Respiratory Status: Nasal cannula History of Recent Intubation: No Behavior/Cognition: Alert;Cooperative Oral Cavity Assessment: Within Functional Limits Oral Care Completed by SLP: No Oral Cavity - Dentition: Adequate natural dentition Vision: Functional for self-feeding Self-Feeding Abilities: Able to feed self;Needs set up Patient Positioning: Upright in bed Baseline Vocal Quality:  Normal Volitional Cough: Weak Volitional Swallow: Able to elicit    Oral/Motor/Sensory Function Overall Oral Motor/Sensory Function: Within functional limits   Ice Chips Ice chips: Not tested   Thin Liquid Thin Liquid: Within functional limits    Nectar Thick Nectar Thick Liquid: Not tested   Honey Thick Honey Thick Liquid: Not tested   Puree Puree: Impaired Presentation: Spoon Pharyngeal Phase Impairments: Throat Clearing - Immediate;Multiple swallows   Solid     Solid: Impaired Presentation: Self Fed Pharyngeal Phase Impairments: Multiple swallows      Gwynneth Aliment, M.A., CF-SLP Speech Language Pathology, Acute Rehabilitation Services  Secure Chat preferred [phone]  08/21/2023,5:29 PM

## 2023-08-21 NOTE — Progress Notes (Signed)
Pt receives out-pt HD at [facility] on TTS. Will assist as needed.   Veleta Yamamoto Renal Navigator [phone] 

## 2023-08-21 NOTE — Progress Notes (Signed)
[facility] Progress Note  Subjective: seen in room, somewhat somnolent today , weak voice and now communicating well.   Vitals:   08/20/23 2042 08/21/23 0420 08/21/23 0800 08/21/23 0832  BP: 137/66 (!) 131/50 (!) 150/63   Pulse: (!) 118 83 88   Resp:   19   Temp: 98.7 F (37.1 C) 97.6 F (36.4 C) 98.7 F (37.1 C)   TempSrc:   Oral   SpO2: 98% 98% 100% 100%  Weight: 99.8 kg     Height: 5\' 3"  (1.6 m)       Exam: Gen alert, no distress, deconditioned, bed bound > 1 yrs No rash, cyanosis or gangrene Sclera anicteric, throat clear  No jvd or bruits Chest clear bilat to bases, no rales/ wheezing RRR no RG Abd soft ntnd no mass or ascites +bs GU defer MS no joint effusions or deformity Ext 1+ bilat pretib edema Neuro is alert, Ox 3 , nf    LUA AVG+bruit           Renal-related home meds: - metoprolol 25 bid - home O2 3 L - eliquis 5 bid - midodrine 15 tid - lyrica 25 qam - renvela 2 ac tid    OP HD: [facility] TTS  4h   400/600   97.5kg  2/2 bath  LUA AVG  Heparin 2000 - last OP HD 11/09, post wt 101.3kg (+4 kg) - coming off 2-4kg over the last 2 wks - hectorol 3 mcg  - mircera 100 mcg IV q 2, last 11/2, due 11/16     Assessment/ Plan: Acute on chronic hypoxic resp failure - due to pna (LLL retrocardiac) and/or vol overload. BNP was up at 1345. Has not been getting to dry wt last 2 wks. Had HD yesterday here w/ 2.5 L UF. Is on usual O2 today 3 L [place].  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 Has not missed HD. Had HD last night as above. Next HD tomorrow.  Somnolence - d/w pmd, concerned about infection, possible PNA. Starting her on IV abx.  BP - bp's wnl, takes midodrine 15 tid at home. Cont here.  Volume - mild edema on exam, admit CXR no edema/ +vasc congestion. Is at home dose of O2 w/ 3L [place] here. Lower vol w/ HD tomorrow as tol.  Anemia of eskd - Hb 11, next esa due on 11/16. Follow.  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 Add on phos.  COPD - on home O2 3 L [place]    Rob Deloy Archey MD   [facility] 08/21/2023, 2:08 PM  Recent Labs  Lab 08/20/23 0657 08/20/23 1147 08/21/23 0402  HGB 10.9*  --  10.2*  CALCIUM  --  9.2 9.4  CREATININE  --  6.94* 5.84*  K 4.7 5.3* 4.7   No results for input(s): "IRON", "TIBC", "FERRITIN" in the last 168 hours. Inpatient medications:  allopurinol  100 mg Oral Daily   amiodarone  400 mg Oral Daily   apixaban  5 mg Oral BID   fluticasone furoate-vilanterol  1 puff Inhalation Daily   insulin aspart  0-5 Units Subcutaneous QHS   insulin aspart  0-9 Units Subcutaneous TID WC   levothyroxine  175 mcg Oral Q0600   midodrine  15 mg Oral Q8H   pantoprazole  40 mg Oral BID AC   pregabalin  25 mg Oral Daily   rosuvastatin  20 mg Oral Daily   sevelamer carbonate  1,600 mg Oral TID WC   umeclidinium bromide  1 puff Inhalation Daily    ampicillin-sulbactam (UNASYN) IV Stopped (08/20/23 1228)   acetaminophen **OR** acetaminophen, albuterol, hydrALAZINE, HYDROmorphone (DILAUDID) injection, meclizine, oxyCODONE

## 2023-08-21 NOTE — Progress Notes (Signed)
PROGRESS NOTE  [name] [name]  DOB: 03/02/1951  PCP: Vladimir Crofts, FNP UEA:[id number]  DOA: 08/20/2023  LOS: 1 day  Hospital Day: 2  Brief narrative: [name] [name] is a 72 y.o. female with PMH significant for ESRD-HD-TTS, obesity, OSA, DM2, HTN, HLD, paroxysmal A-fib on Eliquis, CHF with EF 35 to 40%, CAD, PAD, TIA, COPD on chronic oxygen, chronic anemia, chronic pain syndrome, GERD, hypothyroidism, non-small cell lung cancer s/p curative radiation. Most recently hospitalized 10/3 to 10/12 for decompensated respiratory failure, septic shock required ICU admission.  Also had A-fib with RVR that time and was started on amiodarone by cardiology.  Imagings at the time also showed some concern of metastatic disease, recommended follow-up with primary oncologist.  Earlier this morning, patient was brought to the ED by EMS from home for respite distress.  EMS found her O2 sat at 80% on baseline 3 L oxygen. Last dialysis was on the schedule on 11/9  In the ED, patient is afebrile, heart rate in 80s, blood pressure 103/42, respirate in 20s on 5 L oxygen Initial labs with  WBC count 7.1, hemoglobin 9.1, BNP over 1300, troponin 86 Sodium 134, glucose 154, ABG with pH 7.54, pCO2 31, bicarb 27 Resp virus panel unremarkable Chest x-ray showed severe cardiomegaly with central vascular prominence, no overt edema.  It also showed an opacity in the retrocardiac area- atelectasis versus consolidation Admitted to [facility] Nephrology was consulted. Underwent dialysis on 11/12.  Subjective: Patient was seen and examined this morning. Pleasant elderly African-American female.  Lying down in bed.  Looks somnolent.  Confused.  Conversation less than yesterday.  No family at bedside. Later this morning, RN mention about difficulty swallowing.  Assessment/Plan: Principal Problem:   Acute respiratory failure (HCC)  Acute on chronic respiratory failure with hypoxia Acute pneumonia COPD Brought in from  home for respiratory distress, found to have O2 sat in 80% on 3 L baseline oxygen While in the ED, required oxygen up to 5 L at rest.   Chest x-ray without edema but possible retrocardiac infiltrate WBC count not elevated.  Procalcitonin slightly elevated. Currently on a course of Unasyn. Monitor respiratory status. 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  Has chronic bilateral pedal edema Last dialysis on schedule was 08/17/2023 Nephrology consulted for volume management Most recent echo from July 2024 with EF 35 to 40% PTA meds-metoprolol 37.5 mg twice daily, midodrine 15 mg every 8 hours.  Currently blood pressure elevated to 140s.  Reduce to 10 mg 3 times daily. Resume metoprolol.  Nonobstructive CAD H/o PAD s/p right femoral artery stenting TIA, HLD Last cardiac cath September 2024 with nonobstructive CAD PTA meds-Eliquis, Plavix Crestor  Paroxysmal A-fib Currently in normal sinus rhythm.  Continue amiodarone and metoprolol chronically on Eliquis  Type 2 diabetes mellitus A1c 7.1 on July 2024 PTA meds-sliding scale insulin Continue SSI/Accu-Cheks Recent Labs  Lab 08/20/23 1141 08/20/23 1734 08/20/23 1840 08/21/23 0726 08/21/23 1110  GLUCAP 280* 181* 175* 125* 137*   OSA Nontolerant to CPAP  chronic anemia GERD  H/o gout Allopurinol 100 mg daily  Hypothyroidism Continue Synthroid  H/o non-small cell lung cancer s/p curative radiation Possible new metastasis CT chest on 10/3 suspected a progressive enlargement of soft tissue nodule suspicious for progressive metastasis.  Recommended  follow-up with primary oncologist. I see that she has been scheduled for an outpatient PET scan on 11/22.  Mobility: Wheelchair-bound  Goals of care   Code Status: Limited: Do not attempt  resuscitation (DNR) -DNR-LIMITED -Do Not Intubate/DNI .    DVT prophylaxis:   apixaban (ELIQUIS) tablet 5 mg   Antimicrobials: Unasyn IV Fluid: None Consultants: Nephro Family Communication: Called and updated her son Ray.  DNR/DNI confirmed with the her son   Status: Inpatient Level of care:  Telemetry Medical   Patient is from: Home Needs to continue in-hospital care: Current somnolent today.  Pending PT eval.  On IV antibiotics Anticipated d/c to: Hopefully home in 1 to 2 days    Diet:  Diet Order             Diet renal/carb modified with fluid restriction Diet-HS Snack? Nothing; Fluid restriction: 1200 mL Fluid; Room service appropriate? Yes; Fluid consistency: Thin  Diet effective now                   Scheduled Meds:  allopurinol  100 mg Oral Daily   amiodarone  400 mg Oral Daily   apixaban  5 mg Oral BID   [START ON 08/22/2023] Chlorhexidine Gluconate Cloth  6 each Topical Q0600   clopidogrel  75 mg Oral Daily   fluticasone furoate-vilanterol  1 puff Inhalation Daily   insulin aspart  0-5 Units Subcutaneous QHS   insulin aspart  0-9 Units Subcutaneous TID WC   levothyroxine  175 mcg Oral Q0600   metoprolol tartrate  37.5 mg Oral BID   midodrine  10 mg Oral Q8H   pantoprazole  40 mg Oral BID AC   pregabalin  25 mg Oral Daily   rosuvastatin  20 mg Oral Daily   sevelamer carbonate  1,600 mg Oral TID WC   umeclidinium bromide  1 puff Inhalation Daily    PRN meds: acetaminophen **OR** acetaminophen, albuterol, hydrALAZINE, HYDROmorphone (DILAUDID) injection, meclizine, oxyCODONE   Infusions:   ampicillin-sulbactam (UNASYN) IV Stopped (08/20/23 1228)    Antimicrobials: Anti-infectives (From admission, onward)    Start     Dose/Rate Route Frequency Ordered Stop   08/20/23 1100  Ampicillin-Sulbactam (UNASYN) 3 g in sodium chloride 0.9 % 100 mL IVPB        3 g 200 mL/hr over 30 Minutes Intravenous Daily 08/20/23 1036 08/25/23 1959        Objective: Vitals:   08/21/23 0832 08/21/23 1524  BP:  (!) 145/77  Pulse:  94  Resp:    Temp:  98.5 F (36.9 C)  SpO2: 100% 99%    Intake/Output Summary (Last 24 hours) at 08/21/2023 1543 Last data filed at 08/21/2023 0900 Gross per 24 hour  Intake 580 ml  Output 2500 ml  Net -1920 ml   Filed Weights   08/20/23 2042  Weight: 99.8 kg   Weight change:  Body mass index is 38.97 kg/m.   Physical Exam: General exam: Pleasant elderly African-American female.  Not in physical distress Skin: No rashes, lesions or ulcers. HEENT: Atraumatic, normocephalic, no obvious bleeding Lungs: Clear to auscultation bilaterally CVS: Regular rate and rhythm, no murmur GI/Abd soft, nontender, nondistended, bowel sound present CNS: Somnolent.  Opens eyes on command.  Less conversational this morning. Psychiatry: Sad affect Extremities: No pedal edema, no calf tenderness today  Data Review: I have personally reviewed the laboratory data and studies available.  F/u labs ordered [employer] (From admission, onward)     Start  Ordered   08/22/23 0500  CBC with Differential/Platelet  Tomorrow morning,   R        08/21/23 1538   08/22/23 0500  Basic metabolic panel  Tomorrow morning,   R        08/21/23 1538   08/21/23 1412  Phosphorus  Add-on,   AD        08/21/23 1411            Admission date and time: 08/20/2023  3:48 AM   Total time spent in review of labs and imaging, patient evaluation, formulation of plan, documentation and communication with family: 45 minutes  Signed, Lorin Glass, MD [facility] 08/21/2023

## 2023-08-21 NOTE — TOC CM/SW Note (Signed)
  Transition of Care [facility] Screening Note   Patient Details  Name: CECILE MACADANGDANG Date of Birth: 1951/08/01   Transition of Care [facility]) CM/SW Contact:    Eduard Roux, LCSW Phone Number: 08/21/2023, 10:15 AM    Transition of Care Department [facility]) has reviewed patient and no TOC needs have been identified at this time. We will continue to monitor patient advancement through interdisciplinary progression rounds. If new patient transition needs arise, please place a TOC consult.

## 2023-08-21 NOTE — Plan of Care (Signed)

## 2023-08-21 NOTE — TOC CM/SW Note (Signed)
Transition of Care [facility]) - Inpatient Brief Assessment   Patient Details  Name: [name] [name] MRN: [id number] Date of Birth: 09-01-1951  Transition of Care [facility]) CM/SW Contact:    Tom-Johnson, Hershal Coria, RN Phone Number: 08/21/2023, 3:24 PM   Clinical Narrative:  Patient presented to the ED with Shortness of Breath, admitted with Acute Respiratory Failure. On chronic 3L O2 baseline. Recently admitted with Decompensated Respiratory Failure/ Septic Shock.  Has hx of  ESRD, on TTS outpatient Dialysis schedule, OSA, DM2, HTN, Paroxysmal A-fib on Eliquis, CHF CAD, PAD, TIA, COPD on chronic oxygen, Chronic Anemia, Chronic Pain Syndrome, Non-small Cell Lung Cancer s/p curative radiation. Imaging shows concern of Metastatic Disease, F/u with primary Oncologist recommended. Patient concerned about a wound to her Rt forearm, MD notified.   From home with son, wheelchair dependent. Uses [facility] transportation to and from Dialysis. Has a cane, w/c, walker and shower seat at home.  PCP is [name], [name], FNP and uses [facility] on [address]   Patient not Medically ready for discharge.  CM will continue to follow as patient progresses with care towards discharge              Transition of Care Asessment: Insurance and Status: Insurance coverage has been reviewed Patient has primary care physician: Yes Home environment has been reviewed: Yes Prior level of function:: Modified Independent- wheelchair bound Prior/Current Home Services: No current home services Social Determinants of Health Reivew: SDOH reviewed no interventions necessary Readmission risk has been reviewed: Yes Transition of care needs: transition of care needs identified, TOC will continue to follow

## 2023-08-22 ENCOUNTER — Inpatient Hospital Stay (HOSPITAL_COMMUNITY): Payer: 59

## 2023-08-22 DIAGNOSIS — G9340 Encephalopathy, unspecified: Secondary | ICD-10-CM | POA: Diagnosis not present

## 2023-08-22 DIAGNOSIS — I509 Heart failure, unspecified: Secondary | ICD-10-CM

## 2023-08-22 DIAGNOSIS — J9602 Acute respiratory failure with hypercapnia: Secondary | ICD-10-CM | POA: Diagnosis not present

## 2023-08-22 DIAGNOSIS — J9600 Acute respiratory failure, unspecified whether with hypoxia or hypercapnia: Secondary | ICD-10-CM | POA: Diagnosis not present

## 2023-08-22 DIAGNOSIS — I959 Hypotension, unspecified: Secondary | ICD-10-CM

## 2023-08-22 HISTORY — PX: IR FLUORO GUIDE CV LINE RIGHT: IMG2283

## 2023-08-22 HISTORY — PX: IR US GUIDE VASC ACCESS RIGHT: IMG2390

## 2023-08-22 LAB — CBC WITH DIFFERENTIAL/PLATELET
Abs Immature Granulocytes: 0.09 10*3/uL — ABNORMAL HIGH (ref 0.00–0.07)
Basophils Absolute: 0 10*3/uL (ref 0.0–0.1)
Basophils Relative: 0 %
Eosinophils Absolute: 0 10*3/uL (ref 0.0–0.5)
Eosinophils Relative: 0 %
HCT: 34.2 % — ABNORMAL LOW (ref 36.0–46.0)
Hemoglobin: 9.4 g/dL — ABNORMAL LOW (ref 12.0–15.0)
Immature Granulocytes: 1 %
Lymphocytes Relative: 6 %
Lymphs Abs: 0.7 10*3/uL (ref 0.7–4.0)
MCH: 30.9 pg (ref 26.0–34.0)
MCHC: 27.5 g/dL — ABNORMAL LOW (ref 30.0–36.0)
MCV: 112.5 fL — ABNORMAL HIGH (ref 80.0–100.0)
Monocytes Absolute: 0.7 10*3/uL (ref 0.1–1.0)
Monocytes Relative: 6 %
Neutro Abs: 10.2 10*3/uL — ABNORMAL HIGH (ref 1.7–7.7)
Neutrophils Relative %: 87 %
Platelets: 235 10*3/uL (ref 150–400)
RBC: 3.04 MIL/uL — ABNORMAL LOW (ref 3.87–5.11)
RDW: 15.4 % (ref 11.5–15.5)
WBC: 11.7 10*3/uL — ABNORMAL HIGH (ref 4.0–10.5)
nRBC: 4.8 % — ABNORMAL HIGH (ref 0.0–0.2)

## 2023-08-22 LAB — BLOOD GAS, ARTERIAL
Acid-base deficit: 2.9 mmol/L — ABNORMAL HIGH (ref 0.0–2.0)
Acid-base deficit: 1.2 mmol/L (ref 0.0–2.0)
Bicarbonate: 31.8 mmol/L — ABNORMAL HIGH (ref 20.0–28.0)
Bicarbonate: 30.9 mmol/L — ABNORMAL HIGH (ref 20.0–28.0)
Drawn by: 34762
O2 Saturation: 90.9 %
O2 Saturation: 93.9 %
Patient temperature: 37
Patient temperature: 36.2
pCO2 arterial: >123 mm[Hg] (ref 32–48)
pCO2 arterial: 92 mm[Hg] (ref 32–48)
pH, Arterial: 7.01 — CL (ref 7.35–7.45)
pH, Arterial: 7.13 — CL (ref 7.35–7.45)
pO2, Arterial: 71 mm[Hg] — ABNORMAL LOW (ref 83–108)
pO2, Arterial: 75 mm[Hg] — ABNORMAL LOW (ref 83–108)

## 2023-08-22 LAB — BASIC METABOLIC PANEL
Anion gap: 13 (ref 5–15)
BUN: 57 mg/dL — ABNORMAL HIGH (ref 8–23)
CO2: 28 mmol/L (ref 22–32)
Calcium: 9.3 mg/dL (ref 8.9–10.3)
Chloride: 88 mmol/L — ABNORMAL LOW (ref 98–111)
Creatinine, Ser: 7.5 mg/dL — ABNORMAL HIGH (ref 0.44–1.00)
GFR, Estimated: 5 mL/min — ABNORMAL LOW (ref >60)
Glucose, Bld: 224 mg/dL — ABNORMAL HIGH (ref 70–99)
Potassium: 5 mmol/L (ref 3.5–5.1)
Sodium: 129 mmol/L — ABNORMAL LOW (ref 135–145)

## 2023-08-22 LAB — GLUCOSE, CAPILLARY
Glucose-Capillary: 207 mg/dL — ABNORMAL HIGH (ref 70–99)
Glucose-Capillary: 207 mg/dL — ABNORMAL HIGH (ref 70–99)
Glucose-Capillary: 226 mg/dL — ABNORMAL HIGH (ref 70–99)
Glucose-Capillary: 212 mg/dL — ABNORMAL HIGH (ref 70–99)
Glucose-Capillary: 170 mg/dL — ABNORMAL HIGH (ref 70–99)

## 2023-08-22 MED ORDER — ATROPINE SULFATE 1 MG/10ML IJ SOSY
PREFILLED_SYRINGE | INTRAMUSCULAR | Status: AC
  Filled 2023-08-22: qty 10

## 2023-08-22 MED ORDER — IPRATROPIUM-ALBUTEROL 0.5-2.5 (3) MG/3ML IN SOLN
3.0000 mL | RESPIRATORY_TRACT | Status: DC
  Administered 2023-08-22 – 2023-08-23 (×7): 3 mL via RESPIRATORY_TRACT
  Filled 2023-08-22 (×7): qty 3

## 2023-08-22 MED ORDER — HEPARIN SODIUM (PORCINE) 1000 UNIT/ML DIALYSIS: 2000.0000 [IU] | Freq: Once | INTRAMUSCULAR | Status: DC

## 2023-08-22 MED ORDER — BUDESONIDE 0.5 MG/2ML IN SUSP
0.5000 mg | Freq: Two times a day (BID) | RESPIRATORY_TRACT | Status: DC
Start: 2023-08-22 — End: 2023-08-30
  Administered 2023-08-22 – 2023-08-30 (×15): 0.5 mg via RESPIRATORY_TRACT
  Filled 2023-08-22 (×17): qty 2

## 2023-08-22 MED ORDER — ALTEPLASE 2 MG IJ SOLR: 2.0000 mg | Freq: Once | INTRAMUSCULAR | Status: DC | PRN

## 2023-08-22 MED ORDER — HEPARIN SODIUM (PORCINE) 1000 UNIT/ML DIALYSIS: 2000.0000 [IU] | INTRAMUSCULAR | Status: DC | PRN

## 2023-08-22 MED ORDER — MEDIHONEY WOUND/BURN DRESSING EX PSTE
1.0000 | PASTE | Freq: Every day | CUTANEOUS | Status: DC
  Administered 2023-08-23 – 2023-08-30 (×8): 1 via TOPICAL
  Filled 2023-08-22: qty 44

## 2023-08-22 MED ORDER — LIDOCAINE-EPINEPHRINE 1 %-1:100000 IJ SOLN
INTRAMUSCULAR | Status: AC
Start: 2023-08-22 — End: ?
  Filled 2023-08-22: qty 1

## 2023-08-22 MED ORDER — LIDOCAINE-EPINEPHRINE 1 %-1:100000 IJ SOLN
20.0000 mL | Freq: Once | INTRAMUSCULAR | Status: AC
  Administered 2023-08-22: 20 mL via INTRADERMAL

## 2023-08-22 NOTE — Progress Notes (Signed)
BiPAP patient transported from 5M17 to 5W08 without any complications.

## 2023-08-22 NOTE — Progress Notes (Signed)
IVT consult placed requesting assistance in getting patient PIV access during rapid response.  This RN to bedside. Rapid nurse attempted start x 4. Pt restricted to right side only.  This RN assessed with [name], poor vasculature noted, vessels either very deep, small or non-compressible. Attempt x 1 without success. Previous attempts by IVT unsuccessful as well. Spoke with primary RN and rapid RN, would recommend CVC given pt's status & poor vasculature.

## 2023-08-22 NOTE — Plan of Care (Signed)

## 2023-08-22 NOTE — Progress Notes (Signed)
[facility] Progress Note  Subjective: more somnolent this am and ABG showed severe CO2 retention. Placed on bipap. Had bradycardia and rec'd some atropine. Moved to progressive floor. Remains on bipap.   Vitals:   08/22/23 1200 08/22/23 1404 08/22/23 1417 08/22/23 1426  BP: (!) 126/91 110/75 (!) 132/45 122/62  Pulse: 65 67 67 67  Resp: 18 (!) 27 (!) 24 18  Temp:      TempSrc:      SpO2:  99% 100% 100%  Weight:      Height:        Exam: Gen alert, no distress, deconditioned, bed bound > 1 yrs No rash, cyanosis or gangrene Sclera anicteric, throat clear  No jvd or bruits Chest clear bilat to bases, no rales/ wheezing RRR no RG Abd soft ntnd no mass or ascites +bs GU defer MS no joint effusions or deformity Ext 1+ bilat pretib edema Neuro is alert, Ox 3 , nf    LUA AVG+bruit           Renal-related home meds: - metoprolol 25 bid - home O2 3 L - eliquis 5 bid - midodrine 15 tid - lyrica 25 qam - renvela 2 ac tid    OP HD: [facility]  4h   400/600   97.5kg  2/2 bath  LUA AVG  Heparin 2000 - last OP HD 11/09, post wt 101.3kg (+4 kg) - coming off 2-4kg over the last 2 wks - hectorol 3 mcg  - mircera 100 mcg IV q 2, last 11/2, due 11/16     Assessment/ Plan: 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 BNP was up at 1345. Has not been getting to dry wt last 2 wks. Had HD 11/12 here w/ 2.5 L UF. She was on 3L [place] O2 yesterday which is the same as here home O2 requirement. This am required bipap for severe hypercapnia.  IV access: very difficult, IR consulted and are placing a tunneled PICC for IV access.  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 No missed HD. Had HD here 11/12 as above. Plan HD today upstairs after IR places a line.  Somnolence - getting bipap for CO2 retention.  BP - on midodrine 10 tid for now. BP's okay  Volume - repeat CXR today possible early edema. Lower vol w/ HD tonight as tolerated.  Anemia of eskd - Hb 11,  next esa due on 11/16. Follow.  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 Cont binders.  COPD - on home O2 3 L [place]    Rob Ronson Hagins MD  [facility] 08/22/2023, 2:59 PM  Recent Labs  Lab 08/21/23 0402 08/21/23 1639 08/22/23 0533  HGB 10.2*  --  9.4*  CALCIUM 9.4  --  9.3  PHOS  --  7.0*  --   CREATININE 5.84*  --  7.50*  K 4.7  --  5.0   No results for input(s): "IRON", "TIBC", "FERRITIN" in the last 168 hours. Inpatient medications:  allopurinol  100 mg Oral Daily   amoxicillin-clavulanate  1 tablet Oral QHS   apixaban  5 mg Oral BID   atropine       budesonide (PULMICORT) nebulizer solution  0.5 mg Nebulization BID   Chlorhexidine Gluconate Cloth  6 each Topical Q0600   clopidogrel  75 mg Oral Daily   insulin aspart  0-5 Units Subcutaneous QHS   insulin aspart  0-9 Units Subcutaneous TID WC  ipratropium-albuterol  3 mL Nebulization Q4H   leptospermum manuka honey  1 Application Topical Daily   levothyroxine  175 mcg Oral Q0600   midodrine  10 mg Oral Q8H   pantoprazole  40 mg Oral BID AC   rosuvastatin  20 mg Oral Daily   sevelamer carbonate  1,600 mg Oral TID WC     acetaminophen **OR** acetaminophen, albuterol, atropine, hydrALAZINE, meclizine

## 2023-08-22 NOTE — Procedures (Signed)
Vascular and Interventional Radiology Procedure Note  Patient: [name] [name] DOB: 1951/07/07 Medical Record Number: [id number] Note Date/Time: 08/22/23 3:00 PM   Performing Physician: Roanna Banning, MD Assistant(s): None  Diagnosis: Poor IV access and ESRD  Procedure: TUNNELED CENTRAL VENOUS  CATHETER (PowerLine) PLACEMENT  Anesthesia: Local Anesthetic Complications: None Estimated Blood Loss: Minimal Specimens:  None  Findings:  Successful placement of right-sided,  22 cm  (tip-to-cuff), tunneled (Powerline) catheter with the tip of the catheter at the superior cavoatrial junction.  Plan: Catheter ready for use.  See detailed procedure note with images in PACS. The patient tolerated the procedure well without incident or complication and was returned to Recovery in stable condition.    Roanna Banning, MD [facility] Radiology   Pager. [phone] Clinic. [phone]

## 2023-08-22 NOTE — Consult Note (Signed)
NAME:  [name] [name], MRN:  [id number], DOB:  [birth year], LOS: 2 ADMISSION DATE:  08/20/2023, CONSULTATION DATE:  11/14 REFERRING MD:  Dr. Pola Corn, CHIEF COMPLAINT:  acute respiratory failure w/ hypercapnia   History of Present Illness:  Patient is a 72 year old female with pertinent PMH ESRD on HD TTS, OSA, DMT2, PAF on Eliquis, HFrEF, HTN, HLD, COPD on O2 at home, small cell lung cancer s/p curative radiation presents to [facility] on 11/12 with respiratory failure.  Patient recently hospitalized last month with decompensated respiratory failure and septic shock.  Noted to have A-fib RVR and was started on amnio.  On 11/12 patient with worsening respiratory distress and came to [facility] ED for further eval.  EMS found patient to have sats 80% on home 3L [place].  Patient afebrile and WBC 7.1.  BP stable.  Placed on 5L [place].  ABG 7.54, CO2 31, bicarb 27.  RVP negative.  CXR showing opacity in the retrocardiac area.  PCT slightly elevated.  Patient started on Unasyn and admitted to floors.  Patient received dialysis.  On 11/14 patient more lethargic and w/ some respiratory distress. ABG 7.01, co2 123, po2 71, hco3 31.8. Placed on bipap. CXR w/ worsening perihilar densities suggestive of pulmonary edema; possible consolidation. Patient hypotensive and HR 50s. PCCM consulted.  Pertinent  Medical History   Past Medical History:  Diagnosis Date   Anemia    Arthritis    Asthma    Chronic diastolic (congestive) heart failure (HCC)    Chronic kidney disease    Dialysis T-TH-Sat   Chronic pain syndrome    Class 2 obesity due to excess calories with body mass index (BMI) of 36.0 to 36.9 in adult    COPD (chronic obstructive pulmonary disease) (HCC)    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   Full dentures    GERD (gastroesophageal reflux disease)    Gout    History of hiatal hernia    History of transfusion    Hx MRSA infection    abscess left groin   Hyperlipidemia     Hypertension    Hypothyroid    Intractable nausea and vomiting 02/15/2021   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HCC)    Peripheral vascular disease (HCC)    Sleep apnea    UNABLE TO TOLERATE C PAP   Stress incontinence    TIA (transient ischemic attack)     X2 NO RESIDUAL PROBLEMS   Wears glasses      Significant Hospital Events: Including procedures, antibiotic start and stop dates in addition to other pertinent events   11/12 admitted w/ respiratory distress 11/14 worsening hypercarbic resp failure;   Interim History / Subjective:  On bipap Slow to respond but opens eyes to loud voice and w/ stimulation MAP around 70s HR 50s  Objective   Blood pressure (!) 56/47, pulse (!) 59, temperature 97.8 F (36.6 C), temperature source Oral, resp. rate (!) 26, height 5\' 3"  (1.6 m), weight 99.8 kg, SpO2 95%.    Vent Mode: BIPAP;PCV FiO2 (%):  [70 %] 70 % Set Rate:  [26 bmp] 26 bmp PEEP:  [8 cmH20] 8 cmH20   Intake/Output Summary (Last 24 hours) at 08/22/2023 1024 Last data filed at 08/22/2023 0900 Gross per 24 hour  Intake 598 ml  Output 1 ml  Net 597 ml   Filed Weights   08/20/23 2042  Weight: 99.8 kg    Examination: General:  critically ill appearing female on  bipap HEENT: MM pink/moist; bipap in place Neuro: lethargic but opens eyes to loud voice and w/ stimulation; perrl CV: s1s2, brady 50s, no m/r/g PULM:  dim clear BS bilaterally; on bipap GI: soft, bsx4 active  Extremities: warm/dry Skin: no rashes or lesions    Resolved Hospital Problem list     Assessment & Plan:  Acute on chronic respiratory failure w/ hypercapnia Hx of COPD: on 3L [place] at home OSA/OHS Pulmonary edema Possible Retrocardiac PNA Plan: -transfer to progressive -cont bipap -repeat abg in few hours -will need HD for volume removal when hemodynamically stable -cont unasyn per primary -change inhalers to nebs: scheduled duoneb and pulmicort  Bradycardia Hypotension Plan: -likely resp  acidosis related -cont bipap as above and repeat abg in few hours -cont abx as above -if patient continues to remain hypotensive may need to consider starting pressors -hold amio and metoprolol for now w/ bradycardia/hypotension -resume midodrine when able to take po  Acute metabolic encephalopathy -likely hypercapnia related Plan: -cont bipap and repeat abg in few hours -limit sedating meds -consider CT head if no improvement w/ above  ESRD on HD TTHS Chronic HFrEFCAD PAD HLD PAF T2DM Chronic anemai H/o gout Hypothyroidism H/o of non-small cell lung cancer s/p curative radiation Plan: -per primary   Best Practice (right click and "Reselect all SmartList Selections" daily)  Per primary  Labs   CBC: Recent Labs  Lab 08/20/23 0456 08/20/23 0651 08/20/23 0657 08/21/23 0402 08/22/23 0533  WBC 7.1  --   --  6.4 11.7*  NEUTROABS 5.8  --   --   --  10.2*  HGB 9.1* 11.2* 10.9* 10.2* 9.4*  HCT 32.8* 33.0* 32.0* 35.4* 34.2*  MCV 109.3*  --   --  107.9* 112.5*  PLT 187  --   --  204 235    Basic Metabolic Panel: Recent Labs  Lab 08/20/23 0651 08/20/23 0657 08/20/23 1147 08/21/23 0402 08/21/23 1639 08/22/23 0533  NA 134* 135 135 134*  --  129*  K 4.9 4.7 5.3* 4.7  --  5.0  CL 100  --  93* 94*  --  88*  CO2  --   --  26 26  --  28  GLUCOSE 154*  --  273* 128*  --  224*  BUN 62*  --  53* 36*  --  57*  CREATININE 7.70*  --  6.94* 5.84*  --  7.50*  CALCIUM  --   --  9.2 9.4  --  9.3  PHOS  --   --   --   --  7.0*  --    GFR: Estimated Creatinine Clearance: 7.6 mL/min (A) (by C-G formula based on SCr of 7.5 mg/dL (H)). Recent Labs  Lab 08/20/23 0456 08/20/23 1146 08/21/23 0402 08/22/23 0533  PROCALCITON  --  0.39  --   --   WBC 7.1  --  6.4 11.7*    Liver Function Tests: No results for input(s): "AST", "ALT", "ALKPHOS", "BILITOT", "PROT", "ALBUMIN" in the last 168 hours. No results for input(s): "LIPASE", "AMYLASE" in the last 168 hours. No results for  input(s): "AMMONIA" in the last 168 hours.  ABG    Component Value Date/Time   PHART 7.01 (LL) 08/22/2023 0842   PCO2ART >123 (HH) 08/22/2023 0842   PO2ART 71 (L) 08/22/2023 0842   HCO3 31.8 (H) 08/22/2023 0842   TCO2 28 08/20/2023 0657   ACIDBASEDEF 2.9 (H) 08/22/2023 0842   O2SAT 90.9 08/22/2023 4010  Coagulation Profile: No results for input(s): "INR", "PROTIME" in the last 168 hours.  Cardiac Enzymes: No results for input(s): "CKTOTAL", "CKMB", "CKMBINDEX", "TROPONINI" in the last 168 hours.  HbA1C: Hgb A1c MFr Bld  Date/Time Value Ref Range Status  04/13/2023 06:58 PM 7.1 (H) 4.8 - 5.6 % Final    Comment:    (NOTE) Pre diabetes:          5.7%-6.4%  Diabetes:              >6.4%  Glycemic control for   <7.0% adults with diabetes   08/08/2022 01:30 AM 7.0 (H) 4.8 - 5.6 % Final    Comment:    (NOTE) Pre diabetes:          5.7%-6.4%  Diabetes:              >6.4%  Glycemic control for   <7.0% adults with diabetes     CBG: Recent Labs  Lab 08/21/23 1110 08/21/23 1611 08/21/23 2103 08/22/23 0617 08/22/23 0735  GLUCAP 137* 140* 165* 207* 207*    Review of Systems:   Patient is encephalopathic; therefore, history has been obtained from chart review.    Past Medical History:  She,  has a past medical history of Anemia, Arthritis, Asthma, Chronic diastolic (congestive) heart failure (HCC), Chronic kidney disease, Chronic pain syndrome, Class 2 obesity due to excess calories with body mass index (BMI) of 36.0 to 36.9 in adult, COPD (chronic obstructive pulmonary disease) (HCC), Diabetes mellitus without complication (HCC), Full dentures, GERD (gastroesophageal reflux disease), Gout, History of hiatal hernia, History of transfusion, MRSA infection, Hyperlipidemia, Hypertension, Hypothyroid, Intractable nausea and vomiting (02/15/2021), 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HCC), Peripheral vascular disease (HCC), Sleep apnea, Stress incontinence, TIA (transient ischemic  attack), and Wears glasses.   Surgical History:   Past Surgical History:  Procedure Laterality Date   ABDOMINAL HYSTERECTOMY     AV FISTULA PLACEMENT Left 12/01/2021   Procedure: INSERTION OF LEFT ARM ARTERIOVENOUS (AV) GORE-TEX GRAFT;  Surgeon: Nada Libman, MD;  Location: [facility] OR;  Service: Vascular;  Laterality: Left;   BACK SURGERY  2020   BASCILIC VEIN TRANSPOSITION Left 04/05/2020   Procedure: BASILIC VEIN TRANSPOSITION FIRST STAGE;  Surgeon: Chuck Hint, MD;  Location: [facility] OR;  Service: Vascular;  Laterality: Left;   BRONCHIAL BIOPSY  01/23/2022   Procedure: BRONCHIAL BIOPSIES;  Surgeon: Josephine Igo, DO;  Location: [facility] ENDOSCOPY;  Service: Pulmonary;;   BRONCHIAL BIOPSY  08/07/2022   Procedure: BRONCHIAL BIOPSIES;  Surgeon: Josephine Igo, DO;  Location: [facility] ENDOSCOPY;  Service: Pulmonary;;   BRONCHIAL BRUSHINGS  08/07/2022   Procedure: BRONCHIAL BRUSHINGS;  Surgeon: Josephine Igo, DO;  [facility]: [facility]OS[facility];  Service: Pulmonary;;   BRONCHIAL NEEDLE ASPIRATION BIOPSY  01/23/2022   Procedure: BRONCHIAL NEEDLE ASPIRATION BIOPSIES;  Surgeon: Josephine Igo, DO;  [facility]: [facility]OS[facility];  Service: Pulmonary;;   BRONCHIAL NEEDLE ASPIRATION BIOPSY  08/07/2022   Procedure: BRONCHIAL NEEDLE ASPIRATION BIOPSIES;  Surgeon: Josephine Igo, DO;  Location: [facility]OS[facility];  Service: Pulmonary;;   COLON SURGERY  1995   DUE TO POLYP   DILATION AND CURETTAGE OF UTERUS     ENDOBRONCHIAL ULTRASOUND  01/23/2022   Procedure: ENDOBRONCHIAL ULTRASOUND;  Surgeon: Josephine Igo, DO;  Location: [facility];  Service: Pulmonary;;   ENTEROSCOPY N/A 07/10/2021   Procedure: ENTEROSCOPY;  Surgeon: Kerin Salen, MD;  Location: [facility] ENDOSCOPY;  Service: Gastroenterology;  Laterality: N/A;   ESOPHAGOGASTRODUODENOSCOPY N/A 01/25/2021   Procedure: ESOPHAGOGASTRODUODENOSCOPY (  EGD);  Surgeon: Vida Rigger, MD;  Location: [facility] ENDOSCOPY;  Service: Endoscopy;  Laterality: N/A;   FINE NEEDLE ASPIRATION   01/23/2022   Procedure: FINE NEEDLE ASPIRATION (FNA) LINEAR;  Surgeon: Josephine Igo, DO;  [facility];  Service: Pulmonary;;   FOREARM FRACTURE SURGERY     Left arm   GIVENS CAPSULE STUDY N/A 07/07/2021   Procedure: GIVENS CAPSULE STUDY;  Surgeon: Vida Rigger, MD;  Location: [facility] ENDOSCOPY;  Service: Endoscopy;  Laterality: N/A;   HERNIA REPAIR     w/ mesh   HOT HEMOSTASIS N/A 07/10/2021   Procedure: HOT HEMOSTASIS (ARGON PLASMA COAGULATION/BICAP);  Surgeon: Kerin Salen, MD;  Location: [facility] ENDOSCOPY;  Service: Gastroenterology;  Laterality: N/A;   INCISION AND DRAINAGE ABSCESS Left 02/04/2013   Procedure: INCISION AND DRAINAGE LEFT BUTTOCK ABSCESS; INCISION AND DRAINAGE LEFT BREAST ABSCESS;  Surgeon: Shelly Rubenstein, MD;  [facility]: [facility];  Service: General;  Laterality: Left;   INCISION AND DRAINAGE ABSCESS N/A 02/12/2013   Procedure: INCISION AND DEBRIDEMENT BUTTOCK WOUND ;  Surgeon: Wilmon Arms. Corliss Skains, MD;  Location: [facility];  Service: General;  Laterality: N/A;   INCISION AND DRAINAGE ABSCESS N/A 02/14/2013   Procedure: INCISION AND DRAINAGE/DRESSING CHANGE;  Surgeon: Shelly Rubenstein, MD;  [facility]: [facility];  Service: General;  Laterality: N/A;   INCISION AND DRAINAGE ABSCESS N/A 03/21/2015   Procedure: INCISION AND DRAINAGE PUBIC ABSCESS;  Surgeon: Glenna Fellows, MD;  Location: [facility] ORS;  Service: General;  Laterality: N/A;   INCISION AND DRAINAGE PERIRECTAL ABSCESS Left 02/10/2013   Procedure: IRRIGATION AND DEBRIDEMENT OF BUTTOCK/PERINEAL ABSCESS;  Surgeon: Wilmon Arms. Corliss Skains, MD;  [facility]: [facility] OR;  Service: General;  Laterality: Left;   INCISION AND DRAINAGE PERIRECTAL ABSCESS N/A 02/16/2013   Procedure: IRRIGATION AND DEBRIDEMENT PERINEAL ABSCESS;  Surgeon: Liz Malady, MD;  [facility];  Service: General;  Laterality: N/A;   IR FLUORO GUIDE CV LINE RIGHT  11/30/2021   IR US GUIDE VASC ACCESS RIGHT  11/30/2021   IRRIGATION AND DEBRIDEMENT ABSCESS N/A 02/06/2013   Procedure:  IRRIGATION AND DEBRIDEMENT BUTTOCK ABSCESS AND DRESSING CHANGE;  Surgeon: Shelly Rubenstein, MD;  [facility];  Service: General;  Laterality: N/A;   IRRIGATION AND DEBRIDEMENT ABSCESS Left 02/08/2013   Procedure: IRRIGATION AND DEBRIDEMENT ABSCESS/DRESSING CHANGE;  Surgeon: Cherylynn Ridges, MD;  [facility];  Service: General;  Laterality: Left;   JOINT REPLACEMENT  2010 / 2012    LAPAROSCOPIC CHOLECYSTECTOMY     LEFT HEART CATH AND CORONARY ANGIOGRAPHY N/A 07/05/2023   Procedure: LEFT HEART CATH AND CORONARY ANGIOGRAPHY;  Surgeon: Lennette Bihari, MD;  [facility] INVASIVE CV LAB;  Service: Cardiovascular;  Laterality: N/A;   LEFT HEART CATHETERIZATION WITH CORONARY ANGIOGRAM N/A 07/02/2014   Procedure: LEFT HEART CATHETERIZATION WITH CORONARY ANGIOGRAM;  Surgeon: Runell Gess, MD;  Location[facility] CATH LAB;  Service: Cardiovascular;  Laterality: N/A;   LOWER EXTREMITY ANGIOGRAM Right 04/20/2016   Procedure: Lower Extremity Angiogram;  Surgeon: Sherren Kerns, MD;  Location: [facility] INVASIVE CV LAB;  Service: Cardiovascular;  Laterality: Right;   MULTIPLE TOOTH EXTRACTIONS     PERIPHERAL VASCULAR CATHETERIZATION N/A 04/20/2016   Procedure: Abdominal Aortogram;  Surgeon: Sherren Kerns, MD;  Location: [facility] INVASIVE CV LAB;  Service: Cardiovascular;  Laterality: N/A;   PERIPHERAL VASCULAR CATHETERIZATION Right 04/20/2016   Procedure: Peripheral Vascular Intervention;  Surgeon: Sherren Kerns, MD;  Location: [facility] INVASIVE CV LAB;  Service: Cardiovascular;  Laterality: Right;  popiteal   RIGHT HEART CATH N/A 10/27/2018   Procedure: RIGHT HEART CATH;  Surgeon: Laurey Morale, MD;  Location: [facility] INVASIVE CV LAB;  Service: Cardiovascular;  Laterality: N/A;   RIGHT HEART CATH N/A 03/20/2019   Procedure: RIGHT HEART CATH;  Surgeon: Laurey Morale, MD;  Location: [facility] INVASIVE CV LAB;  Service: Cardiovascular;  Laterality: N/A;   THYROIDECTOMY     TOTAL HIP REVISION Right 08/11/2014   Procedure: RIGHT  ACETABULAR REVISION;  Surgeon: Loanne Drilling, MD;  Location: [facility] ORS;  Service: Orthopedics;  Laterality: Right;   VASCULAR SURGERY     VIDEO BRONCHOSCOPY WITH ENDOBRONCHIAL ULTRASOUND Bilateral 08/07/2022   Procedure: VIDEO BRONCHOSCOPY WITH ENDOBRONCHIAL ULTRASOUND;  Surgeon: Josephine Igo, DO;  [facility] ENDOS[facility];  Service: Pulmonary;  Laterality: Bilateral;   VIDEO BRONCHOSCOPY WITH RADIAL ENDOBRONCHIAL ULTRASOUND  01/23/2022   Procedure: RADIAL ENDOBRONCHIAL ULTRASOUND;  Surgeon: Josephine Igo, DO;  Location: [facility] ENDOSCOPY;  Service: Pulmonary;;     Social History:   reports that she quit smoking about 22 months ago. Her smoking use included cigarettes. She started smoking about 41 years ago. She has a 10 pack-year smoking history. She has been exposed to tobacco smoke. She has quit using smokeless tobacco. She reports current alcohol use of about 1.0 standard drink of alcohol per week. She reports that she does not use drugs.   Family History:  Her family history includes Cancer in her father; Cardiomyopathy in her mother; Diabetes in her brother; Heart disease in her mother.   Allergies Allergies  Allergen Reactions   Nebivolol Swelling    Chest pain (reaction to Bystolic)   Zestril [Lisinopril] Swelling and Other (See Comments)   Ace Inhibitors Swelling and Other (See Comments)    Tongue swell   Morphine And Codeine Itching     Home Medications  Prior to Admission medications   Medication Sig Start Date End Date Taking? Authorizing Provider  acetaminophen (TYLENOL) 500 MG tablet Take 1 tablet (500 mg total) by mouth See admin instructions. Take 1,000mg  (2 tablets) by mouth twice a day in addition to 1,000mg  (2 tablets) twice daily as needed for pain. Patient taking differently: Take 1,000 mg by mouth in the morning and at bedtime. 05/14/23  Yes Ghimire, Werner Lean, MD  albuterol (PROVENTIL) (2.5 MG/3ML) 0.083% nebulizer solution Take 3 mLs (2.5 mg total) by nebulization  every 6 (six) hours as needed for wheezing or shortness of breath. Patient taking differently: Take 2.5 mg by nebulization 3 (three) times daily. 05/14/23  Yes Ghimire, Werner Lean, MD  albuterol (VENTOLIN HFA) 108 (90 Base) MCG/ACT inhaler Inhale 2 puffs into the lungs every 6 (six) hours as needed for shortness of breath (COPD). 05/14/23  Yes Ghimire, Werner Lean, MD  allopurinol (ZYLOPRIM) 100 MG tablet Take 1 tablet (100 mg total) by mouth daily. 05/14/23  Yes Ghimire, Werner Lean, MD  apixaban (ELIQUIS) 5 MG TABS tablet Take 1 tablet (5 mg total) by mouth 2 (two) times daily. 07/20/23  Yes Ghimire, Werner Lean, MD  budesonide-formoterol (SYMBICORT) 160-4.5 MCG/ACT inhaler Inhale 2 puffs into the lungs in the morning and at bedtime. 05/14/23  Yes Ghimire, Werner Lean, MD  clopidogrel (PLAVIX) 75 MG tablet Take 75 mg by mouth daily.   Yes [provider]  diclofenac Sodium (VOLTAREN) 1 % GEL Apply 2 g topically 4 (four) times daily. Patient taking differently: Apply 1 Application topically daily. 04/17/23  Yes Vann, Jessica U, DO  docusate sodium (COLACE) 100 MG  capsule Take 1 capsule (100 mg total) by mouth daily. 05/14/23  Yes Ghimire, Werner Lean, MD  fluticasone (FLONASE) 50 MCG/ACT nasal spray Place 1 spray into both nostrils daily. Patient taking differently: Place 2 sprays into both nostrils daily. 05/14/23  Yes Ghimire, Werner Lean, MD  insulin lispro (HUMALOG KWIKPEN) 100 UNIT/ML KwikPen 0-15 Units, Subcutaneous, 3 times daily with meal CBG < 70: Implement Hypoglycemia meausers CBG 70 - 120: 0 units CBG 121 - 150: 2 units CBG 151 - 200: 3 units CBG 201 - 250: 5 units CBG 251 - 300: 8 units CBG 301 - 350: 11 units CBG 351 - 400: 15 units CBG > 400: call MD Patient taking differently: Inject 0-15 Units into the skin daily. 0-15 Units,  CBG < 70: Implement Hypoglycemia meausers CBG 70 - 120: 0 units CBG 121 - 150: 2 units CBG 151 - 200: 3 units CBG 201 - 250: 5 units CBG 251 - 300: 8 units CBG 301 - 350: 11  units CBG 351 - 400: 15 units CBG > 400: call MD 05/14/23  Yes Ghimire, Werner Lean, MD  levothyroxine (SYNTHROID) 175 MCG tablet Take 1 tablet (175 mcg total) by mouth daily. 05/14/23  Yes Ghimire, Werner Lean, MD  lidocaine-prilocaine (EMLA) cream Apply 1 Application topically See admin instructions. On dialysis days (Tues,Thurs,Sat) 02/13/22  Yes [provider]  meclizine (ANTIVERT) 25 MG tablet Take 25 mg by mouth as needed for dizziness or nausea. 06/12/23  Yes [provider]  metoprolol tartrate (LOPRESSOR) 25 MG tablet Take 1.5 tablets (37.5 mg total) by mouth 2 (two) times daily. 07/20/23  Yes Ghimire, Werner Lean, MD  midodrine (PROAMATINE) 5 MG tablet Take 3 tablets (15 mg total) by mouth every 8 (eight) hours. 07/20/23  Yes Ghimire, Werner Lean, MD  Multiple Vitamins-Iron (MULTIVITAMINS WITH IRON) TABS tablet Take 1 tablet by mouth in the morning. 05/14/23  Yes Ghimire, Werner Lean, MD  nitroGLYCERIN (NITROSTAT) 0.4 MG SL tablet Place 1 tablet (0.4 mg total) under the tongue every 5 (five) minutes as needed for chest pain. 07/06/23 07/05/24 Yes Cyndi Bender, NP  ondansetron (ZOFRAN-ODT) 4 MG disintegrating tablet Take 2 mg by mouth every 6 (six) hours as needed for nausea or vomiting. 07/10/23  Yes [provider]  pantoprazole (PROTONIX) 40 MG tablet Take 1 tablet (40 mg total) by mouth 2 (two) times daily before a meal. Patient taking differently: Take 40 mg by mouth daily. 05/14/23  Yes Ghimire, Werner Lean, MD  rosuvastatin (CRESTOR) 20 MG tablet Take 1 tablet (20 mg total) by mouth daily. 07/06/23  Yes Cyndi Bender, NP  sevelamer carbonate (RENVELA) 800 MG tablet Take 2 tablets (1,600 mg total) by mouth 3 (three) times daily with meals. 05/14/23  Yes Ghimire, Werner Lean, MD  thiamine (VITAMIN B1) 100 MG tablet Take 1 tablet (100 mg total) by mouth in the morning. 05/14/23  Yes Ghimire, Werner Lean, MD  amiodarone (PACERONE) 200 MG tablet Take 2 tablets (400 mg total) by mouth 2 (two) times daily  for 7 days, THEN 2 tablets (400 mg total) daily for 7 days, THEN 1 tablet (200 mg total) daily. Patient not taking: Reported on 08/20/2023 07/20/23 09/20/23  Maretta Bees, MD  lidocaine 4 % Place 2 patches onto the skin daily. Each lower side of back. Patient not taking: Reported on 08/20/2023 05/14/23   Maretta Bees, MD  Methoxy PEG-Epoetin Beta (MIRCERA IJ) Inject 1 Syringe into the skin every 14 (fourteen) days. 06/01/23 05/30/24  [provider]  OXYGEN Inhale into the lungs.    [provider]  pregabalin (LYRICA) 25 MG capsule Take 25 mg by mouth daily. Patient not taking: Reported on 08/20/2023 05/29/23   [provider]  umeclidinium bromide (INCRUSE ELLIPTA) 62.5 MCG/ACT AEPB Inhale 1 puff into the lungs daily. Patient not taking: Reported on 08/20/2023 05/14/23   Maretta Bees, MD         JD Anselm Lis [facility] 08/22/2023, 11:03 AM  Please see [email or web address] for pager details.  From 7A-7P if no response, please call [phone]. After hours, please call ELink [phone].

## 2023-08-22 NOTE — Consult Note (Addendum)
WOC Nurse Consult Note: Reason for Consult: Consult requested for right arm. Full thickness wound, 100% eschar, no odor, drainage or fluctuance. 3X2.5cm Topical treatment orders provided for bedside nurses to perform as follows to assist with removal of nonviable tissue: Interchangeable with TheraHoney Apply Medihoney to right arm wound Q day, then cover with foam dressing.  Change foam dressing Q 3 days or PRN soiling. Please re-consult if further assistance is needed.  Thank-you,  Cammie Mcgee MSN, RN, CWOCN, [place], CNS [phone]

## 2023-08-22 NOTE — Progress Notes (Signed)
RT NOTE:  Pt transported to and from IR via ventilator (NIV) with no complications noted.

## 2023-08-22 NOTE — Progress Notes (Signed)
Patient taken off BIPAP and placed on 12L [place], patient currently tolerating well. RN informed

## 2023-08-22 NOTE — Progress Notes (Signed)
Report called and pt transported to 5 W. Pt responding better p BiPAP implemented. IV team unsuccessful. Family present, has pt's cell phone and clothing. See documentation from Rapid Response and RT who have been assisting c pt's care.

## 2023-08-22 NOTE — Progress Notes (Signed)
ABG sent to lab °

## 2023-08-22 NOTE — Significant Event (Signed)
Rapid Response Event Note   Reason for Call :  lethargy  Initial Focused Assessment:  Patient lying in bed, opens eyes to stimulation though with assessment eyes remained opened. A&Ox3. Lungs wet/wheezing, difficulty d/t body habitus. Heart tones normal. Some edema noted on in BLE.  Skin cool to touch, bed sheets noted to be wet with sweat.   138/48 (72) HR 52 RR 22 02 100% 4L [place] Temp 97.8  CBG 207  Interventions:  CXR ABG MD at bedside  Plan of Care:  ABG prior to HD this AM though patient mentation is improving throughout exam.   Event Summary:  MD Notified: B. Dahal MD Call Time: [id number] Arrival Time: 0736 End Time: 0800  Truddie Crumble, RN

## 2023-08-22 NOTE — Progress Notes (Signed)
Repeat ABG sent to lab

## 2023-08-22 NOTE — Progress Notes (Signed)
PROGRESS NOTE  [name] [name]  DOB: 1951/02/08  PCP: Vladimir Crofts, FNP ZOX:[id number]  DOA: 08/20/2023  LOS: 2 days  Hospital Day: 3  Brief narrative: [name] [name] is a 72 y.o. female with PMH significant for ESRD-HD-TTS, obesity, OSA, DM2, HTN, HLD, paroxysmal A-fib on Eliquis, CHF with EF 35 to 40%, CAD, PAD, TIA, COPD on chronic oxygen, chronic anemia, chronic pain syndrome, GERD, hypothyroidism, non-small cell lung cancer s/p curative radiation. Most recently hospitalized 10/3 to 10/12 for decompensated respiratory failure, septic shock required ICU admission.  Also had A-fib with RVR that time and was started on amiodarone by cardiology.  Imagings at the time also showed some concern of metastatic disease, recommended follow-up with primary oncologist.  Earlier this morning, patient was brought to the ED by EMS from home for respite distress.  EMS found her O2 sat at 80% on baseline 3 L oxygen. Last dialysis was on the schedule on 11/9  In the ED, patient is afebrile, heart rate in 80s, blood pressure 103/42, respirate in 20s on 5 L oxygen Initial labs with  WBC count 7.1, hemoglobin 9.1, BNP over 1300, troponin 86 Sodium 134, glucose 154, ABG with pH 7.54, pCO2 31, bicarb 27 Resp virus panel unremarkable Chest x-ray showed severe cardiomegaly with central vascular prominence, no overt edema.  It also showed an opacity in the retrocardiac area- atelectasis versus consolidation Admitted to [facility] Nephrology was consulted. Underwent dialysis on 11/12.  Subjective: Patient was seen and examined this morning. Rapid response was called this morning for increased somnolence.  At the time of my evaluation, patient was somnolent but able to open eyes and answer in single words.  Did not look volume overloaded. Blood gas was done which showed significantly elevated pCO2 at 123 and pH low at 7.1. BiPAP was started. Critical care consultation was called as well. Patient is DNR/DNI.    Assessment/Plan: Principal Problem:   Acute respiratory failure (HCC) Active Problems:   Hypotension   Acute encephalopathy  Acute on chronic respiratory failure with hypoxia and hypercapnia Brought in from home for respiratory distress, found to have O2 sat in 80% on 3 L baseline oxygen Significantly hypercapnic this morning with pCO2 over 123.  Started on BiPAP.  DNR/DNI.  Critical care consulted as well. Ordered transfer to progressive. On my evaluation later this morning, patient was gradually waking up on BiPAP.  Acute pneumonia COPD Chest x-ray on admission showed retrocardiac infiltrate WBC count not elevated.  Procalcitonin slightly elevated. Patient was started on a course of Unasyn. 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  Has chronic bilateral pedal edema Last dialysis on schedule was 08/17/2023 Nephrology consulted for volume management Most recent echo from July 2024 with EF 35 to 40% PTA meds-metoprolol 37.5 mg twice daily, midodrine 15 mg every 8 hours.  Continued on metoprolol.  Midodrine dose reduced to 10 mg 3 times daily.  Nonobstructive CAD H/o PAD s/p right femoral artery stenting TIA, HLD Last cardiac cath September 2024 with nonobstructive CAD PTA meds-Eliquis, Plavix, Crestor  Paroxysmal A-fib Currently in normal sinus rhythm.  Continue amiodarone and metoprolol. chronically on Eliquis.  Type 2 diabetes mellitus with hyperglycemia A1c 7.1 on July 2024 PTA meds-sliding scale insulin Continue SSI/Accu-Cheks Recent  Labs  Lab 08/21/23 1110 08/21/23 1611 08/21/23 2103 08/22/23 0617 08/22/23 0735  GLUCAP 137* 140* 165* 207* 207*   OSA Per history, not on CPAP at home because of nontolerance.    chronic anemia GERD  H/o  gout Allopurinol 100 mg daily  Hypothyroidism Continue Synthroid  H/o non-small cell lung cancer s/p curative radiation Possible new metastasis CT chest on 10/3 suspected a progressive enlargement of soft tissue nodule suspicious for progressive metastasis.  Recommended follow-up with primary oncologist. I see that she has been scheduled for an outpatient PET scan on 11/22.  Mobility: Wheelchair-bound  Goals of care   Code Status: Limited: Do not attempt resuscitation (DNR) -DNR-LIMITED -Do Not Intubate/DNI .    DVT prophylaxis:   apixaban (ELIQUIS) tablet 5 mg   Antimicrobials: Unasyn IV Fluid: None Consultants: Nephro, PCCM today. Family Communication: Critical care team called and updated family   Status: Inpatient Level of care:  Progressive   Patient is from: Home Needs to continue in-hospital care: On BiPAP today Anticipated d/c to: Pending clinical course    Diet:  Diet Order             Diet renal/carb modified with fluid restriction Diet-HS Snack? Nothing; Fluid restriction: 1200 mL Fluid; Room service appropriate? Yes; Fluid consistency: Thin  Diet effective now                   Scheduled Meds:  allopurinol  100 mg Oral Daily   amoxicillin-clavulanate  1 tablet Oral QHS   apixaban  5 mg Oral BID   atropine       budesonide (PULMICORT) nebulizer solution  0.5 mg Nebulization BID   Chlorhexidine Gluconate Cloth  6 each Topical Q0600   clopidogrel  75 mg Oral Daily   insulin aspart  0-5 Units Subcutaneous QHS   insulin aspart  0-9 Units Subcutaneous TID WC   ipratropium-albuterol  3 mL Nebulization Q4H   leptospermum manuka honey  1 Application Topical Daily   levothyroxine  175 mcg Oral Q0600   midodrine  10 mg Oral Q8H   pantoprazole  40 mg Oral BID AC   rosuvastatin  20 mg Oral Daily   sevelamer carbonate  1,600 mg Oral TID WC    PRN meds: acetaminophen **OR** acetaminophen, albuterol, atropine, hydrALAZINE, meclizine   Infusions:      Antimicrobials: Anti-infectives (From admission, onward)    Start     Dose/Rate Route Frequency Ordered Stop   08/21/23 2200  amoxicillin-clavulanate (AUGMENTIN) 500-125 MG per tablet 1 tablet        1 tablet Oral Daily at bedtime 08/21/23 1611 08/25/23 2159   08/20/23 1100  Ampicillin-Sulbactam (UNASYN) 3 g in sodium chloride 0.9 % 100 mL IVPB  Status:  Discontinued        3 g 200 mL/hr over 30 Minutes Intravenous Daily 08/20/23 1036 08/21/23 1611       Objective: Vitals:   08/22/23 1032 08/22/23 1046  BP: (!) 101/55 120/70  Pulse: 64 67  Resp:    Temp:    SpO2: 98% 98%    Intake/Output Summary (Last 24 hours) at 08/22/2023 1112 Last data filed at 08/22/2023 0900 Gross per 24 hour  Intake 530 ml  Output 1 ml  Net 529 ml   Filed Weights   08/20/23 2042  Weight: 99.8 kg   Weight change:  Body mass index is 38.97 kg/m.   Physical Exam: General exam: Pleasant elderly African-American female.  Not in physical distress  Skin: No rashes, lesions or ulcers. HEENT: Atraumatic, normocephalic, no obvious bleeding Lungs: Clear to auscultation bilaterally CVS: Regular rate and rhythm, no murmur GI/Abd soft, nontender, nondistended, bowel sound present CNS: Somnolent.  Opens eyes on command.  Less conversational this morning. Psychiatry: Sad affect Extremities: No pedal edema, no calf tenderness today  Data Review: I have personally reviewed the laboratory data and studies available.  F/u labs ordered Unresulted Labs (From admission, onward)     Start     Ordered   08/23/23 0500  CBC with Differential/Platelet  Tomorrow morning,   R        08/22/23 1112   08/23/23 0500  Basic metabolic panel  Tomorrow morning,   R        08/22/23 1112   08/22/23 1400  Blood gas, arterial  Once,   R        08/22/23 1054            Total time spent in review of labs and imaging, patient evaluation, formulation of plan, documentation and communication with family: 45  minutes  Signed, Lorin Glass, MD [facility] 08/22/2023

## 2023-08-22 NOTE — Progress Notes (Signed)
Upon entering room at 0730 pt slow to respond, breathing with mouth open, eyes closed, diaphoretic. CN and rapid response nurse called. Pt did respond slowly as she awakened and answered orientation questions appropriately but voice hoarse. VSS. She told me I was talking to loud. Pt is hard of hearing. On 4 liters oxygen via [place]. Has improved this am while we awaited lab.  Dr. Pola Corn present and was notified of STAT ABG results. Plan is to put pt on BiPaP and transfer off floor. HD delayed per Dr. Pola Corn.

## 2023-08-22 NOTE — Progress Notes (Signed)
Consulted with PM respiratory therapist for evaluation for wheezing,and treatment. Patient mouth-breathing. Patient awakened to assess LOC. Patient VS stable,patient felt cool and diaphoretic to touch. Asked patient how she felt,she stated she was hot. Bed linens changed and applied fan to patient,lowered room temperature. Patient blood glucose 207.

## 2023-08-22 NOTE — Significant Event (Signed)
Rapid Response Event Note   Reason for Call :   Monitor bipap until transferred PCU  Initial Focused Assessment:  Patient unable to hold head up, only opens eyes briefly with painful stimulation, increased somnolence compared to 0730 assessment call. Unable to answer questions.   92/71 (78) HR 35-42 RR 26 O2 91-98% Bipap  Interventions:  RT to bedside, Bipap adjusted Atropine to bedside PIV attempted x4 CCM consulted, to bedside Nephro rounding  Plan of Care:  Mentation slowly improved, no need for ICU at this time. Continuous Bipap.   Event Summary:  MD Notified: B. Dahal MD, CCM MD/NP Call Time: 0932 Arrival Time: 2956 End Time: 1110  Truddie Crumble, RN

## 2023-08-23 ENCOUNTER — Inpatient Hospital Stay (HOSPITAL_COMMUNITY): Payer: 59

## 2023-08-23 DIAGNOSIS — J9600 Acute respiratory failure, unspecified whether with hypoxia or hypercapnia: Secondary | ICD-10-CM | POA: Diagnosis not present

## 2023-08-23 DIAGNOSIS — J441 Chronic obstructive pulmonary disease with (acute) exacerbation: Secondary | ICD-10-CM | POA: Diagnosis not present

## 2023-08-23 DIAGNOSIS — G9340 Encephalopathy, unspecified: Secondary | ICD-10-CM | POA: Diagnosis not present

## 2023-08-23 DIAGNOSIS — I509 Heart failure, unspecified: Secondary | ICD-10-CM | POA: Diagnosis not present

## 2023-08-23 LAB — CBC WITH DIFFERENTIAL/PLATELET
Abs Immature Granulocytes: 0.07 10*3/uL (ref 0.00–0.07)
Basophils Absolute: 0 10*3/uL (ref 0.0–0.1)
Basophils Relative: 0 %
Eosinophils Absolute: 0 10*3/uL (ref 0.0–0.5)
Eosinophils Relative: 0 %
HCT: 29.3 % — ABNORMAL LOW (ref 36.0–46.0)
Hemoglobin: 8.7 g/dL — ABNORMAL LOW (ref 12.0–15.0)
Immature Granulocytes: 1 %
Lymphocytes Relative: 5 %
Lymphs Abs: 0.4 10*3/uL — ABNORMAL LOW (ref 0.7–4.0)
MCH: 32 pg (ref 26.0–34.0)
MCHC: 29.7 g/dL — ABNORMAL LOW (ref 30.0–36.0)
MCV: 107.7 fL — ABNORMAL HIGH (ref 80.0–100.0)
Monocytes Absolute: 0.6 10*3/uL (ref 0.1–1.0)
Monocytes Relative: 7 %
Neutro Abs: 7.8 10*3/uL — ABNORMAL HIGH (ref 1.7–7.7)
Neutrophils Relative %: 87 %
Platelets: 176 10*3/uL (ref 150–400)
RBC: 2.72 MIL/uL — ABNORMAL LOW (ref 3.87–5.11)
RDW: 15.5 % (ref 11.5–15.5)
Smear Review: NORMAL
WBC: 8.9 10*3/uL (ref 4.0–10.5)
nRBC: 3 % — ABNORMAL HIGH (ref 0.0–0.2)

## 2023-08-23 LAB — GLUCOSE, CAPILLARY
Glucose-Capillary: 114 mg/dL — ABNORMAL HIGH (ref 70–99)
Glucose-Capillary: 133 mg/dL — ABNORMAL HIGH (ref 70–99)
Glucose-Capillary: 179 mg/dL — ABNORMAL HIGH (ref 70–99)
Glucose-Capillary: 142 mg/dL — ABNORMAL HIGH (ref 70–99)

## 2023-08-23 LAB — RENAL FUNCTION PANEL
Albumin: 3.2 g/dL — ABNORMAL LOW (ref 3.5–5.0)
Anion gap: 13 (ref 5–15)
BUN: 36 mg/dL — ABNORMAL HIGH (ref 8–23)
CO2: 29 mmol/L (ref 22–32)
Calcium: 9 mg/dL (ref 8.9–10.3)
Chloride: 93 mmol/L — ABNORMAL LOW (ref 98–111)
Creatinine, Ser: 4.94 mg/dL — ABNORMAL HIGH (ref 0.44–1.00)
GFR, Estimated: 9 mL/min — ABNORMAL LOW (ref >60)
Glucose, Bld: 132 mg/dL — ABNORMAL HIGH (ref 70–99)
Phosphorus: 4.1 mg/dL (ref 2.5–4.6)
Potassium: 3.6 mmol/L (ref 3.5–5.1)
Sodium: 135 mmol/L (ref 135–145)

## 2023-08-23 MED ORDER — IPRATROPIUM-ALBUTEROL 0.5-2.5 (3) MG/3ML IN SOLN: 3.0000 mL | Freq: Three times a day (TID) | RESPIRATORY_TRACT | Status: DC

## 2023-08-23 MED ORDER — CHLORHEXIDINE GLUCONATE CLOTH 2 % EX PADS
6.0000 | MEDICATED_PAD | Freq: Every day | CUTANEOUS | Status: DC
  Administered 2023-08-24 – 2023-08-28 (×5): 6 via TOPICAL

## 2023-08-23 MED ORDER — ALBUTEROL SULFATE (2.5 MG/3ML) 0.083% IN NEBU
2.5000 mg | INHALATION_SOLUTION | RESPIRATORY_TRACT | Status: DC | PRN
Start: 2023-08-23 — End: 2023-08-30

## 2023-08-23 MED ORDER — METOPROLOL TARTRATE 25 MG PO TABS
25.0000 mg | ORAL_TABLET | Freq: Two times a day (BID) | ORAL | Status: DC
  Administered 2023-08-23 – 2023-08-24 (×4): 25 mg via ORAL
  Filled 2023-08-23 (×4): qty 1

## 2023-08-23 MED ORDER — SODIUM CHLORIDE 0.9% FLUSH: 10.0000 mL | INTRAVENOUS | Status: DC | PRN

## 2023-08-23 MED ORDER — SODIUM CHLORIDE 0.9% FLUSH
10.0000 mL | Freq: Two times a day (BID) | INTRAVENOUS | Status: DC
  Administered 2023-08-23 – 2023-08-29 (×12): 10 mL

## 2023-08-23 MED ORDER — AMPICILLIN-SULBACTAM SODIUM 3 (2-1) G IJ SOLR
3.0000 g | Freq: Two times a day (BID) | INTRAMUSCULAR | Status: AC
  Administered 2023-08-23 – 2023-08-26 (×8): 3 g via INTRAVENOUS
  Filled 2023-08-23 (×8): qty 8

## 2023-08-23 MED ORDER — REVEFENACIN 175 MCG/3ML IN SOLN
175.0000 ug | Freq: Every day | RESPIRATORY_TRACT | Status: DC
  Administered 2023-08-23 – 2023-08-30 (×6): 175 ug via RESPIRATORY_TRACT
  Filled 2023-08-23 (×8): qty 3

## 2023-08-23 MED ORDER — ARFORMOTEROL TARTRATE 15 MCG/2ML IN NEBU
15.0000 ug | INHALATION_SOLUTION | Freq: Two times a day (BID) | RESPIRATORY_TRACT | Status: DC
  Administered 2023-08-23 – 2023-08-30 (×12): 15 ug via RESPIRATORY_TRACT
  Filled 2023-08-23 (×14): qty 2

## 2023-08-23 NOTE — Progress Notes (Signed)
Patients condition quickly deteriorates without ventilator. Removal of the ventilator may cause serious harm to the patient, exacerbation of condition and hospital readmission.   Bilevel/RAD has been tried and failed to maintain or stabilize the patient. Bilevel cannot meet current volume requirements. Patient requires frequent durations of ventilatory support. Intermittent usage is insufficient.

## 2023-08-23 NOTE — Progress Notes (Signed)
Received patient in bed to unit.  Alert and oriented to person.  Informed consent signed and in chart.   TX duration: 3:00  Patient tolerated well.  Transported back to the room  Alert, without acute distress.  Hand-off given to patient's nurse.   Access used: LUE AVG Access issues: None  Total UF removed: 2500 mL Medication(s) given: None Post HD VS: please see data insert    08/23/23 0357  Vitals  Temp 99 F (37.2 C)  Temp Source Axillary  BP (!) 146/59  MAP (mmHg) 83  BP Location Right Arm  BP Method Automatic  Patient Position (if appropriate) Lying  Pulse Rate 81  Pulse Rate Source Monitor  ECG Heart Rate 87  Resp (!) 26  Oxygen Therapy  SpO2 100 %  O2 Device HFNC  O2 Flow Rate (L/min) 12 L/min  Patient Activity (if Appropriate) In bed  Pulse Oximetry Type Continuous  Post Treatment  Dialyzer Clearance Lightly streaked  Hemodialysis Intake (mL) 0 mL  Liters Processed 71.9  Fluid Removed (mL) 2500 mL  Tolerated HD Treatment Yes  Post-Hemodialysis Comments  (Treatment completed and blood returned without issue.)  AVG/AVF Arterial Site Held (minutes) 10 minutes (Gauze applied and secured witn paper tape; hemostasis achieved.)  AVG/AVF Venous Site Held (minutes) 10 minutes (Gauze applied and secured witn paper tape; hemostasis achieved.)  Note  Patient Observations Patient alert, no c/o voiced, no acute distress noted; patient condition stable for return transport.  Fistula / Graft Left Upper arm Arteriovenous vein graft  Placement Date/Time: 12/01/21 1444   Placed prior to admission: No  Orientation: Left  Access Location: Upper arm  Access Type: Arteriovenous vein graft  Site Condition No complications  Fistula / Graft Assessment Present;Thrill;Bruit  Status Flushed;Patent;Deaccessed  Drainage Description None      [facility]

## 2023-08-23 NOTE — TOC Progression Note (Addendum)
Transition of Care [facility]) - Progression Note    Patient Details  Name: [name] [name] MRN: [id number] Date of Birth: 05-Apr-1951  [facility] CM/SW Contact  Lockie Pares, RN Phone Number: 08/23/2023, 1:54 PM  Clinical Narrative:     Patient presents hypercarbic, SHOB on home oxygen 3 L. From home with adult children has wheelchair uses [facility] transport to and from dialysis.  Has all DME needed at home, however is being worked up for Home BiPap from [facility]. Called Lynette at [facility] to see if she is still active with HH. Will need to add RT to [facility]. Recommend PT , OT, RT.   [facility] is not active, and cannot accept back due to staffing. Requested acceptance from [facility]Frances [facility]ish accepted for PT OT RN.  [phone] Paperwork for BiPap received back by [employer] they will be processing.  Will likely set up at home when DC   [facility] will continue to follow for needs recommendations, and transitions of care.       Expected Discharge Plan and Services   Discharge Planning Services: CM Consult Post Acute Care Choice: Home Health, Durable Medical Equipment Living arrangements for the past 2 months: Apartment                 DME Arranged: Bipap DME Agency: [facility] Date DME Agency Contacted: 08/23/23 Time DME Agency Contacted: [phone] Representative spoke with at DME Agency: Macon Large             Social Determinants of Health (SDOH) Interventions SDOH Screenings   Food Insecurity: No Food Insecurity (08/20/2023)  Housing: Patient Declined (08/20/2023)  Transportation Needs: No Transportation Needs (08/20/2023)  Utilities: Not At Risk (08/20/2023)  Tobacco Use: Medium Risk (08/20/2023)    Readmission Risk Interventions    08/21/2023    3:23 PM 05/09/2023    3:37 PM 05/08/2023   12:17 PM  Readmission Risk Prevention Plan  Transportation Screening Complete Complete Complete  Medication Review (RN Care Manager) Referral to Pharmacy Complete Complete  PCP or  Specialist appointment within 3-5 days of discharge Complete Complete Complete  HRI or Home Care Consult Complete Complete Complete  SW Recovery Care/Counseling Consult Complete Complete Complete  Palliative Care Screening Not Applicable Not Applicable Not Applicable  Skilled Nursing Facility Not Applicable Complete Complete

## 2023-08-23 NOTE — Progress Notes (Addendum)
PROGRESS NOTE        PATIENT DETAILS Name: [name] [name] Age: 72 y.o. Sex: female Date of Birth: 26-Nov-1950 Admit Date: 08/20/2023 Admitting Physician Lorin Glass, MD QIO:NGEX, [name], FNP  Brief Summary: Patient is a 72 y.o.  female with history of ESRD on HD TTS, HFrEF, COPD on home O2 3 L/min, nonobstructive CAD on recent [facility] 9/27, PAF on Eliquis, small cell cancer of lung-s/p radiation (completed April 06, 2022), OSA-nontolerant to CPAP, HTN, DM-2-who presented to the hospital on 11/12 with shortness of breath-she was found to have PNA-and subsequently admitted to the hospitalist service.  Hospital course subsequently complicated by hypercarbia requiring initiation of BiPAP and transfer to progressive care unit.    Significant events: 10/3-10/12-hospitalization for volume overload/COPD exacerbation/acute metabolic encephalopathy 11/12>> admit to [facility]-PNA 11/14>> somnolent-found to have hypercarbia-BiPAP-transfer to PCU.  Significant studies: 11/12>> CXR: Possible retrocardiac infiltrate-possible left lower lobe consolidation 11/14>> CXR: Worsening pulm edema 11/15>> CXR improved pulmonary edema.  Stable dense left lower lobe airspace disease.  Significant microbiology data: 11/12>> COVID/influenza/RSV PCR: Negative  Procedures: 11/14>> right-sided TDC catheter by IR  Consults: PCCM Nephrology  Subjective: Awake on BiPAP-attempting to pull it out-hard to discern but seems like she is saying she feels better.  Underwent HD earlier this morning-2.5 L UF removed.  Objective: Vitals: Blood pressure (!) 146/59, pulse 84, temperature 99 F (37.2 C), temperature source Axillary, resp. rate (!) 26, height 5\' 3"  (1.6 m), weight 99.8 kg, SpO2 98%.   Exam: Gen Exam:Alert awake-not in any distress-although on BiPAP. HEENT:atraumatic, normocephalic Chest: Clear to auscultation anteriorly. CVS:S1S2 regular Abdomen:soft non tender, non  distended Extremities: Trace edema Neurology: Difficult exam-but moving all 4 extremities.   Skin: no rash  Pertinent Labs/Radiology:    Latest Ref Rng & Units 08/22/2023    5:33 AM 08/21/2023    4:02 AM 08/20/2023    6:57 AM  CBC  WBC 4.0 - 10.5 K/uL 11.7  6.4    Hemoglobin 12.0 - 15.0 g/dL 9.4  52.8  41.3   Hematocrit 36.0 - 46.0 % 34.2  35.4  32.0   Platelets 150 - 400 K/uL 235  204      Lab Results  Component Value Date   NA 129 (L) 08/22/2023   K 5.0 08/22/2023   CL 88 (L) 08/22/2023   CO2 28 08/22/2023      Assessment/Plan: Acute on chronic hypercarbic/hypoxic respiratory failure Multifactorial etiology-acute pulm edema due to HFrEF exacerbation, underlying COPD/OSA (previously noncompliant to CPAP), PNA all playing a role  Required BiPAP for most of the day yesterday-was placed back on BiPAP earlier this morning.  Since then-she has had hemodialysis with 2.5 L volume removal-she seems fairly awake/alert this morning-will attempt to liberate of BiPAP and place on HFNC O2 In the interim-will continue to treat underlying treatment etiologies-see below.  Acute metabolic encephalopathy Secondary to hypercarbia Improved with treatment of underlying etiology/BiPAP Supportive care Delirium precautions  LLL PNA Will switch back to Unasyn-currently on BiPAP-when breathing/respiratory status is stable-will transition to oral antibiotics. SLP eval to ensure no occult aspiration issues  Addendum: D/w SLP-suspected esophageal origin for dysphagia-2/2 reflux-on PPI-currently on HFNC-suspect not yet stable for GI eval. Will observe closely for now-and will consider GI consult when more medically appropriate.   COPD Continue scheduled bronchodilators No obvious wheezing-not in exacerbation but suspect still playing a  role in hypoxia/hypercarbia.  History of OSA Previously noncompliant to BiPAP/CPAP Currently with worsening hypercarbia and requiring BiPAP since 11/14 Will see  if she will allow/use BiPAP going forward.  Acute on chronic HFrEF(EF 35-40% by TTE on 04/15/2023) Volume removal with hemodialysis Since blood pressure is stable (although on midodrine) will resume low-dose metoprolol.  PAF Telemetry monitoring Resume low-dose beta-blocker today Continue Eliquis Reviewed most recent cardiology note-thought not to be a candidate for long-term amiodarone-does not appear to be on amiodarone any longer.   Nonobstructive CAD on recent [facility] on 9/27 No chest pain Should not be on Plavix any longer as patient is now on Eliquis-stop Plavix today Continue beta-blocker/statin.     History of PAD Statin See above regarding Plavix.   ESRD on HD TTS Nephrology following and directing HD care   Normocytic anemia Secondary to combination of ESRD/critical illness Aranesp/iron deferred to nephrology service Follow CBC  Hyponatremia Mild Should improve with volume removal with HD Follow electrolytes periodically   Hypothyroidism Synthroid TSH 10/3 stable   DM-2 (A1c 7.1 on 7/6) CBG stable-continue SSI  Recent Labs    08/22/23 1652 08/22/23 2127 08/23/23 0809  GLUCAP 212* 170* 114*    Gout Not in exacerbation Allopurinol  History of lung cancer-s/p radiation Recently seen by Dr. Fay Records PET scan planned.  Per prior imaging-some concern for recurrence of underlying cancer.   Osteoarthritis of bilateral knees Stable at present-outpatient orthopedic evaluation   Debility/deconditioning Per patient-she has not ambulated in close to a month-previously minimally ambulatory and mostly bed to wheelchair bound PT/OT eval when more stable.   Nutrition Status: Nutrition Problem: Increased nutrient needs Etiology: chronic illness (ESRD on HD) Signs/Symptoms: estimated needs Interventions: Refer to RD note for recommendations  Obesity: Estimated body mass index is 38.97 kg/m as calculated from the following:   Height as of this  encounter: 5\' 3"  (1.6 m).   Weight as of this encounter: 99.8 kg.   The patient is critically ill with multiple organ system failure and requires high complexity decision making for assessment and support, frequent evaluation and titration of therapies, advanced monitoring, review of radiographic studies and interpretation of complex data.   Critical Care time spent= 45 min  Code status:   Code Status: Limited: Do not attempt resuscitation (DNR) -DNR-LIMITED -Do Not Intubate/DNI    DVT Prophylaxis: apixaban (ELIQUIS) tablet 5 mg    Family Communication: None at bedside   Disposition Plan: Status is: Inpatient Remains inpatient appropriate because: Severity of illness   Planned Discharge Destination:Home   Diet: Diet Order             Diet renal/carb modified with fluid restriction Diet-HS Snack? Nothing; Fluid restriction: 1200 mL Fluid; Room service appropriate? Yes; Fluid consistency: Thin  Diet effective now                     Antimicrobial agents: Anti-infectives (From admission, onward)    Start     Dose/Rate Route Frequency Ordered Stop   08/21/23 2200  amoxicillin-clavulanate (AUGMENTIN) 500-125 MG per tablet 1 tablet  Status:  Discontinued        1 tablet Oral Daily at bedtime 08/21/23 1611 08/23/23 0802   08/20/23 1100  Ampicillin-Sulbactam (UNASYN) 3 g in sodium chloride 0.9 % 100 mL IVPB  Status:  Discontinued        3 g 200 mL/hr over 30 Minutes Intravenous Daily 08/20/23 1036 08/21/23 1611        MEDICATIONS: Scheduled Meds:  allopurinol  100 mg Oral Daily   apixaban  5 mg Oral BID   budesonide (PULMICORT) nebulizer solution  0.5 mg Nebulization BID   Chlorhexidine Gluconate Cloth  6 each Topical Q0600   clopidogrel  75 mg Oral Daily   insulin aspart  0-5 Units Subcutaneous QHS   insulin aspart  0-9 Units Subcutaneous TID WC   ipratropium-albuterol  3 mL Nebulization Q4H   leptospermum manuka honey  1 Application Topical Daily    levothyroxine  175 mcg Oral Q0600   midodrine  10 mg Oral Q8H   pantoprazole  40 mg Oral BID AC   rosuvastatin  20 mg Oral Daily   sevelamer carbonate  1,600 mg Oral TID WC   Continuous Infusions: PRN Meds:.acetaminophen **OR** acetaminophen, albuterol, hydrALAZINE, meclizine   I have personally reviewed following labs and imaging studies  LABORATORY DATA: CBC: Recent Labs  Lab 08/20/23 0456 08/20/23 0651 08/20/23 0657 08/21/23 0402 08/22/23 0533  WBC 7.1  --   --  6.4 11.7*  NEUTROABS 5.8  --   --   --  10.2*  HGB 9.1* 11.2* 10.9* 10.2* 9.4*  HCT 32.8* 33.0* 32.0* 35.4* 34.2*  MCV 109.3*  --   --  107.9* 112.5*  PLT 187  --   --  204 235    Basic Metabolic Panel: Recent Labs  Lab 08/20/23 0651 08/20/23 0657 08/20/23 1147 08/21/23 0402 08/21/23 1639 08/22/23 0533  NA 134* 135 135 134*  --  129*  K 4.9 4.7 5.3* 4.7  --  5.0  CL 100  --  93* 94*  --  88*  CO2  --   --  26 26  --  28  GLUCOSE 154*  --  273* 128*  --  224*  BUN 62*  --  53* 36*  --  57*  CREATININE 7.70*  --  6.94* 5.84*  --  7.50*  CALCIUM  --   --  9.2 9.4  --  9.3  PHOS  --   --   --   --  7.0*  --     GFR: Estimated Creatinine Clearance: 7.6 mL/min (A) (by C-G formula based on SCr of 7.5 mg/dL (H)).  Liver Function Tests: No results for input(s): "AST", "ALT", "ALKPHOS", "BILITOT", "PROT", "ALBUMIN" in the last 168 hours. No results for input(s): "LIPASE", "AMYLASE" in the last 168 hours. No results for input(s): "AMMONIA" in the last 168 hours.  Coagulation Profile: No results for input(s): "INR", "PROTIME" in the last 168 hours.  Cardiac Enzymes: No results for input(s): "CKTOTAL", "CKMB", "CKMBINDEX", "TROPONINI" in the last 168 hours.  BNP (last 3 results) No results for input(s): "PROBNP" in the last 8760 hours.  Lipid Profile: No results for input(s): "CHOL", "HDL", "LDLCALC", "TRIG", "CHOLHDL", "LDLDIRECT" in the last 72 hours.  Thyroid Function Tests: No results for  input(s): "TSH", "T4TOTAL", "FREET4", "T3FREE", "THYROIDAB" in the last 72 hours.  Anemia Panel: No results for input(s): "VITAMINB12", "FOLATE", "FERRITIN", "TIBC", "IRON", "RETICCTPCT" in the last 72 hours.  Urine analysis:    Component Value Date/Time   COLORURINE YELLOW 11/27/2021 0956   APPEARANCEUR HAZY (A) 11/27/2021 0956   LABSPEC 1.012 11/27/2021 0956   PHURINE 5.0 11/27/2021 0956   GLUCOSEU NEGATIVE 11/27/2021 0956   HGBUR NEGATIVE 11/27/2021 0956   BILIRUBINUR NEGATIVE 11/27/2021 0956   KETONESUR NEGATIVE 11/27/2021 0956   PROTEINUR 30 (A) 11/27/2021 0956   UROBILINOGEN 0.2 08/04/2014 1409   NITRITE NEGATIVE 11/27/2021 0956   LEUKOCYTESUR  NEGATIVE 11/27/2021 0956    Sepsis Labs: Lactic Acid, Venous    Component Value Date/Time   LATICACIDVEN 0.9 07/11/2023 1334    MICROBIOLOGY: Recent Results (from the past 240 hour(s))  Resp panel by RT-PCR (RSV, Flu A&B, Covid) Anterior Nasal Swab     Status: None   Collection Time: 08/20/23  4:56 AM   Specimen: Anterior Nasal Swab  Result Value Ref Range Status   SARS Coronavirus 2 by RT PCR NEGATIVE NEGATIVE Final   Influenza A by PCR NEGATIVE NEGATIVE Final   Influenza B by PCR NEGATIVE NEGATIVE Final    Comment: (NOTE) The Xpert Xpress SARS-CoV-2/FLU/RSV plus assay is intended as an aid in the diagnosis of influenza from Nasopharyngeal swab specimens and should not be used as a sole basis for treatment. Nasal washings and aspirates are unacceptable for Xpert Xpress SARS-CoV-2/FLU/RSV testing.  Fact Sheet for Patients: BloggerCourse.com  Fact Sheet for Healthcare Providers: SeriousBroker.it  This test is not yet approved or cleared by the [place] FDA and has been authorized for detection and/or diagnosis of SARS-CoV-2 by FDA under an Emergency Use Authorization (EUA). This EUA will remain in effect (meaning this test can be used) for the duration of  the COVID-19 declaration under Section 564(b)(1) of the Act, 21 U.S.C. section 360bbb-3(b)(1), unless the authorization is terminated or revoked.     Resp Syncytial Virus by PCR NEGATIVE NEGATIVE Final    Comment: (NOTE) Fact Sheet for Patients: BloggerCourse.com  Fact Sheet for Healthcare Providers: SeriousBroker.it  This test is not yet approved or cleared by the [place] FDA and has been authorized for detection and/or diagnosis of SARS-CoV-2 by FDA under an Emergency Use Authorization (EUA). This EUA will remain in effect (meaning this test can be used) for the duration of the COVID-19 declaration under Section 564(b)(1) of the Act, 21 U.S.C. section 360bbb-3(b)(1), unless the authorization is terminated or revoked.  Performed at [facility], [address] [place], [place] [address]     RADIOLOGY STUDIES/RESULTS: DG Chest Port 1V same Day  Result Date: 08/23/2023 CLINICAL DATA:  [id number] with shortness of breath. EXAM: PORTABLE CHEST 1 VIEW COMPARISON:  Portable chest yesterday at 8:18 a.m. FINDINGS: 7:01 a.m. There is a new right IJ infusion catheter with the tip about the superior cavoatrial junction. There is no pneumothorax. There are stable dense left lower lobe airspace disease and small left pleural effusion. The heart is enlarged. Perihilar vascular congestion and mild central edema are still seen but improved, with regression of subpleural edema from the peripheral lungs to the center. The right sulci are sharp. The mediastinum is stable with aortic atherosclerosis. Patient rotated to the left. IMPRESSION: 1. New right IJ infusion catheter with the tip about the superior cavoatrial junction. No pneumothorax. 2. Cardiomegaly with perihilar vascular congestion and mild central edema, improved. 3. Stable dense left lower lobe airspace disease and small left pleural effusion. Electronically Signed   By: Almira Bar M.D.   On: 08/23/2023 07:28   IR Fluoro Guide CV Line Right  Result Date: 08/22/2023 INDICATION: Poor IV access.  ESRD on HD via a fistula.  Rapid response. EXAM: ULTRASOUND AND FLUOROSCOPIC GUIDED PLACEMENT OF TUNNELED CENTRAL VENOUS CATHETER MEDICATIONS: None. ANESTHESIA/SEDATION: Local anesthetic was administered. The patient was continuously monitored during the procedure by the interventional radiology nurse under my direct supervision. FLUOROSCOPY TIME:  Fluoroscopic dose; 2 mGy COMPLICATIONS: None immediate. PROCEDURE: Informed written consent was obtained from the patient and/or patient's representative after  a discussion of the risks, benefits, and alternatives to treatment. Questions regarding the procedure were encouraged and answered. The RIGHT neck and chest were prepped with chlorhexidine in a sterile fashion, and a sterile drape was applied covering the operative field. Maximum barrier sterile technique with sterile gowns and gloves were used for the procedure. A timeout was performed prior to the initiation of the procedure. After the overlying soft tissues were anesthetized, a small venotomy incision was created and a micropuncture kit was utilized to access the internal jugular vein. Real-time ultrasound guidance was utilized for vascular access including the acquisition of a permanent ultrasound image documenting patency of the accessed vessel. The microwire was utilized to measure appropriate catheter length. The micropuncture sheath was exchanged for a peel-away sheath over a guidewire. A 5 Fr dual lumen tunneled central venous catheter measuring 22 cm was tunneled in a retrograde fashion from the anterior chest wall to the venotomy incision. The catheter was then placed through the peel-away sheath with tip ultimately positioned at the superior caval-atrial junction. Final catheter positioning was confirmed and documented with a spot radiographic image. The catheter aspirates and  flushes normally. The catheter was flushed with appropriate volume heparin dwells. The catheter exit site was secured with a 0-Prolene retention suture. The venotomy incision was closed with Dermabond. Dressings were applied. The patient tolerated the procedure well without immediate post procedural complication. FINDINGS: After catheter placement, the tip lies within the superior cavoatrial junction. The catheter aspirates and flushes normally and is ready for immediate use. IMPRESSION: Successful placement of 22 cm dual lumen tunneled central venous "Powerline" catheter via the RIGHT internal jugular vein The tip of the catheter is positioned at the superior cavo-atrial junction. The catheter is ready for immediate use. Roanna Banning, MD [facility] Radiology Electronically Signed   By: Roanna Banning M.D.   On: 08/22/2023 14:57   IR US Guide Vasc Access Right  Result Date: 08/22/2023 INDICATION: Poor IV access.  ESRD on HD via a fistula.  Rapid response. EXAM: ULTRASOUND AND FLUOROSCOPIC GUIDED PLACEMENT OF TUNNELED CENTRAL VENOUS CATHETER MEDICATIONS: None. ANESTHESIA/SEDATION: Local anesthetic was administered. The patient was continuously monitored during the procedure by the interventional radiology nurse under my direct supervision. FLUOROSCOPY TIME:  Fluoroscopic dose; 2 mGy COMPLICATIONS: None immediate. PROCEDURE: Informed written consent was obtained from the patient and/or patient's representative after a discussion of the risks, benefits, and alternatives to treatment. Questions regarding the procedure were encouraged and answered. The RIGHT neck and chest were prepped with chlorhexidine in a sterile fashion, and a sterile drape was applied covering the operative field. Maximum barrier sterile technique with sterile gowns and gloves were used for the procedure. A timeout was performed prior to the initiation of the procedure. After the overlying soft  tissues were anesthetized, a small venotomy incision was created and a micropuncture kit was utilized to access the internal jugular vein. Real-time ultrasound guidance was utilized for vascular access including the acquisition of a permanent ultrasound image documenting patency of the accessed vessel. The microwire was utilized to measure appropriate catheter length. The micropuncture sheath was exchanged for a peel-away sheath over a guidewire. A 5 Fr dual lumen tunneled central venous catheter measuring 22 cm was tunneled in a retrograde fashion from the anterior chest wall to the venotomy incision. The catheter was then placed through the peel-away sheath with tip ultimately positioned at the superior caval-atrial junction. Final catheter positioning was confirmed and documented with a spot radiographic  image. The catheter aspirates and flushes normally. The catheter was flushed with appropriate volume heparin dwells. The catheter exit site was secured with a 0-Prolene retention suture. The venotomy incision was closed with Dermabond. Dressings were applied. The patient tolerated the procedure well without immediate post procedural complication. FINDINGS: After catheter placement, the tip lies within the superior cavoatrial junction. The catheter aspirates and flushes normally and is ready for immediate use. IMPRESSION: Successful placement of 22 cm dual lumen tunneled central venous "Powerline" catheter via the RIGHT internal jugular vein The tip of the catheter is positioned at the superior cavo-atrial junction. The catheter is ready for immediate use. Roanna Banning, MD [facility] Radiology Electronically Signed   By: Roanna Banning M.D.   On: 08/22/2023 14:57   DG Chest Port 1 View  Result Date: 08/22/2023 CLINICAL DATA:  [id number] Acute respiratory distress [id number] EXAM: PORTABLE CHEST 1 VIEW COMPARISON:  August 20, 2023 FINDINGS: Marked cardiomegaly. There is  increased perihilar and interstitial density. Similar left retrocardiac consolidation. No acute osseous abnormality. IMPRESSION: 1. Cardiomegaly with worsening perihilar and interstitial densities, suggestive of worsening pulmonary edema. 2. Similar left retrocardiac density, with considerations including atelectasis/consolidation versus effusion. Electronically Signed   By: Olive Bass M.D.   On: 08/22/2023 09:16     LOS: 3 days   Jeoffrey Massed, MD  [facility]    To contact the attending provider between 7A-7P or the covering provider during after hours 7P-7A, please log into the web site [email or web address] and access using universal [place] password for that web site. If you do not have the password, please call the hospital operator.  08/23/2023, 8:03 AM

## 2023-08-23 NOTE — Progress Notes (Signed)
Called respiratory at [phone] and spoke to therapist at [phone] and advised that Dr. Jerral Ralph wants patient back on HFNC [place] due to patient pulling on Bipap mask.

## 2023-08-23 NOTE — Progress Notes (Signed)
[facility] Progress Note  Subjective: more alert and responsive today. Had HD overnight w/ 2.5 L off. Bipap dc'd and is on HFNC at 4 L today.   Vitals:   08/23/23 0831 08/23/23 0838 08/23/23 1102 08/23/23 1103  BP:   (!) 129/44   Pulse:   74   Resp:   18   Temp:   99.3 F (37.4 C)   TempSrc:   Oral   SpO2: 98% 99%  99%  Weight:      Height:        Exam: Gen alert, not in distress, 4 L [place] No jvd or bruits Chest clear bilat to bases RRR no RG Abd soft ntnd no mass or ascites +bs MS no joint effusions or deformity Ext 1+ bilat pretib edema Neuro is alert, nf    LUA AVG+bruit           Renal-related home meds: - metoprolol 25 bid - home O2 3 L - eliquis 5 bid - midodrine 15 tid - lyrica 25 qam - renvela 2 ac tid    OP HD: [facility] TTS  4h   400/600   97.5kg  2/2 bath  LUA AVG  Heparin 2000 - last OP HD 11/09, post wt 101.3kg (+4 kg) - coming off 2-4kg over the last 2 wks - hectorol 3 mcg  - mircera 100 mcg IV q 2, last 11/2, due 11/16     Assessment/ Plan: 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 BNP was up at 1345. Has not been getting to dry wt last 2 wks. Had HD 11/12 here w/ 2.5 L UF. Pt decompensated yesterday and required bipap for poor mental status and severe hypercarbia. Had mild edema by CXR yest and did HD w/ 2.5 L off w/ stable BP's. Post HD were able to wean down the O2 and is on 4 L [place] today.  IV access: very difficult, IR placed a tunneled PICC for IV access.  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 No missed HD. Had HD here 11/12 as above. Next HD was 11/14 last night overnight. Plan next HD tomorrow.  BP - on midodrine 10 tid for now. BP's okay  Volume - repeat CXR 11/14 showed early edema. Got vol down w/ HD yest, will cont to lower vol w/ HD as tol w/ HD.  Anemia of eskd - Hb 11, next esa due on 11/16. Follow.  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 Cont binders.  COPD - on home O2 3 L [place]    Rob  Brinae Woods MD  [facility] 08/23/2023, 2:28 PM  Recent Labs  Lab 08/21/23 1639 08/22/23 0533 08/23/23 0839  HGB  --  9.4* 8.7*  ALBUMIN  --   --  3.2*  CALCIUM  --  9.3 9.0  PHOS 7.0*  --  4.1  CREATININE  --  7.50* 4.94*  K  --  5.0 3.6   No results for input(s): "IRON", "TIBC", "FERRITIN" in the last 168 hours. Inpatient medications:  allopurinol  100 mg Oral Daily   apixaban  5 mg Oral BID   arformoterol  15 mcg Nebulization BID   budesonide (PULMICORT) nebulizer solution  0.5 mg Nebulization BID   Chlorhexidine Gluconate Cloth  6 each Topical Q0600   insulin aspart  0-5 Units Subcutaneous QHS   insulin aspart  0-9 Units Subcutaneous TID WC   leptospermum manuka honey  1 Application Topical  Daily   levothyroxine  175 mcg Oral Q0600   metoprolol tartrate  25 mg Oral BID   midodrine  10 mg Oral Q8H   pantoprazole  40 mg Oral BID AC   revefenacin  175 mcg Nebulization Daily   rosuvastatin  20 mg Oral Daily   sevelamer carbonate  1,600 mg Oral TID WC   sodium chloride flush  10-40 mL Intracatheter Q12H    ampicillin-sulbactam (UNASYN) IV 3 g (08/23/23 0957)    acetaminophen **OR** acetaminophen, albuterol, hydrALAZINE, meclizine, sodium chloride flush

## 2023-08-23 NOTE — Plan of Care (Signed)
Problem: Education: Goal: Ability to describe self-care measures that may prevent or decrease complications (Diabetes Survival Skills Education) will improve 08/23/2023 0748 by Horris Latino, RN Outcome: Progressing 08/22/2023 1849 by Horris Latino, RN Outcome: Progressing Goal: Individualized Educational Video(s) 08/23/2023 0748 by Horris Latino, RN Outcome: Progressing 08/22/2023 1849 by Horris Latino, RN Outcome: Progressing   Problem: Coping: Goal: Ability to adjust to condition or change in health will improve 08/23/2023 0748 by Horris Latino, RN Outcome: Progressing 08/22/2023 1849 by Horris Latino, RN Outcome: Progressing   Problem: Fluid Volume: Goal: Ability to maintain a balanced intake and output will improve 08/23/2023 0748 by Horris Latino, RN Outcome: Progressing 08/22/2023 1849 by Horris Latino, RN Outcome: Progressing   Problem: Health Behavior/Discharge Planning: Goal: Ability to identify and utilize available resources and services will improve 08/23/2023 0748 by Horris Latino, RN Outcome: Progressing 08/22/2023 1849 by Horris Latino, RN Outcome: Progressing Goal: Ability to manage health-related needs will improve 08/23/2023 0748 by Horris Latino, RN Outcome: Progressing 08/22/2023 1849 by Horris Latino, RN Outcome: Progressing   Problem: Metabolic: Goal: Ability to maintain appropriate glucose levels will improve 08/23/2023 0748 by Horris Latino, RN Outcome: Progressing 08/22/2023 1849 by Horris Latino, RN Outcome: Progressing   Problem: Nutritional: Goal: Maintenance of adequate nutrition will improve 08/23/2023 0748 by Horris Latino, RN Outcome: Progressing 08/22/2023 1849 by Horris Latino, RN Outcome: Progressing Goal: Progress toward achieving an optimal weight will improve 08/23/2023 0748 by Horris Latino, RN Outcome: Progressing 08/22/2023 1849 by Horris Latino, RN Outcome: Progressing   Problem: Skin Integrity: Goal: Risk for impaired skin  integrity will decrease 08/23/2023 0748 by Horris Latino, RN Outcome: Progressing 08/22/2023 1849 by Horris Latino, RN Outcome: Progressing   Problem: Tissue Perfusion: Goal: Adequacy of tissue perfusion will improve 08/23/2023 0748 by Horris Latino, RN Outcome: Progressing 08/22/2023 1849 by Horris Latino, RN Outcome: Progressing   Problem: Education: Goal: Knowledge of General Education information will improve Description: Including pain rating scale, medication(s)/side effects and non-pharmacologic comfort measures 08/23/2023 0748 by Horris Latino, RN Outcome: Progressing 08/22/2023 1849 by Horris Latino, RN Outcome: Progressing   Problem: Health Behavior/Discharge Planning: Goal: Ability to manage health-related needs will improve 08/23/2023 0748 by Horris Latino, RN Outcome: Progressing 08/22/2023 1849 by Horris Latino, RN Outcome: Progressing   Problem: Clinical Measurements: Goal: Ability to maintain clinical measurements within normal limits will improve 08/23/2023 0748 by Horris Latino, RN Outcome: Progressing 08/22/2023 1849 by Horris Latino, RN Outcome: Progressing Goal: Will remain free from infection 08/23/2023 0748 by Horris Latino, RN Outcome: Progressing 08/22/2023 1849 by Horris Latino, RN Outcome: Progressing Goal: Diagnostic test results will improve 08/23/2023 0748 by Horris Latino, RN Outcome: Progressing 08/22/2023 1849 by Horris Latino, RN Outcome: Progressing Goal: Respiratory complications will improve 08/23/2023 0748 by Horris Latino, RN Outcome: Progressing 08/22/2023 1849 by Horris Latino, RN Outcome: Progressing Goal: Cardiovascular complication will be avoided 08/23/2023 0748 by Horris Latino, RN Outcome: Progressing 08/22/2023 1849 by Horris Latino, RN Outcome: Progressing   Problem: Activity: Goal: Risk for activity intolerance will decrease 08/23/2023 0748 by Horris Latino, RN Outcome: Progressing 08/22/2023 1849 by Horris Latino, RN Outcome:  Progressing   Problem: Nutrition: Goal: Adequate nutrition will be maintained 08/23/2023 0748 by Horris Latino, RN Outcome: Progressing 08/22/2023 1849 by Horris Latino, RN Outcome: Progressing   Problem: Coping: Goal: Level of anxiety will decrease 08/23/2023 0748 by Horris Latino, RN Outcome: Progressing 08/22/2023 1849 by Horris Latino, RN Outcome: Progressing   Problem: Elimination: Goal: Will not experience complications related to bowel motility 08/23/2023 0748  by Horris Latino, RN Outcome: Progressing 08/22/2023 1849 by Horris Latino, RN Outcome: Progressing Goal: Will not experience complications related to urinary retention 08/23/2023 0748 by Horris Latino, RN Outcome: Progressing 08/22/2023 1849 by Horris Latino, RN Outcome: Progressing   Problem: Pain Management: Goal: General experience of comfort will improve 08/23/2023 0748 by Horris Latino, RN Outcome: Progressing 08/22/2023 1849 by Horris Latino, RN Outcome: Progressing   Problem: Safety: Goal: Ability to remain free from injury will improve 08/23/2023 0748 by Horris Latino, RN Outcome: Progressing 08/22/2023 1849 by Horris Latino, RN Outcome: Progressing   Problem: Skin Integrity: Goal: Risk for impaired skin integrity will decrease 08/23/2023 0748 by Horris Latino, RN Outcome: Progressing 08/22/2023 1849 by Horris Latino, RN Outcome: Progressing

## 2023-08-23 NOTE — Care Management (Deleted)
Patients condition quickly deteriorates without ventilator. Removal of the ventilator may cause serious harm to the patient, exacerbation of condition and hospital readmission.   Bilevel/RAD has been tried and failed to maintain or stabilize the patient. Bilevel cannot meet current volume requirements. Patient requires frequent durations of ventilatory support. Intermittent usage is insufficient.

## 2023-08-23 NOTE — Care Management Important Message (Signed)
Important Message  Patient Details  Name: [name] [name] MRN: [id number] Date of Birth: [birth year]   Important Message Given:  Yes - Medicare IM     Dorena Bodo 08/23/2023, 3:36 PM

## 2023-08-23 NOTE — Progress Notes (Signed)
Pharmacy Antibiotic Note  [name] [name] is a 72 y.o. female admitted on 08/20/2023 with pneumonia.  Pharmacy has been consulted for unasyn dosing.  Plan: Unasyn 3 grams iv q12h  Height: 5\' 3"  (160 cm) Weight: 99.8 kg (220 lb 0.3 oz) IBW/kg (Calculated) : 52.4  Temp (24hrs), Avg:98.5 F (36.9 C), Min:97.1 F (36.2 C), Max:100 F (37.8 C)  Recent Labs  Lab 08/20/23 0456 08/20/23 0651 08/20/23 1147 08/21/23 0402 08/22/23 0533  WBC 7.1  --   --  6.4 11.7*  CREATININE  --  7.70* 6.94* 5.84* 7.50*    Estimated Creatinine Clearance: 7.6 mL/min (A) (by C-G formula based on SCr of 7.5 mg/dL (H)).    Allergies  Allergen Reactions   Nebivolol Swelling    Chest pain (reaction to Bystolic)   Zestril [Lisinopril] Swelling and Other (See Comments)   Ace Inhibitors Swelling and Other (See Comments)    Tongue swell   Morphine And Codeine Itching     Dose adjustments this admission: Unasyn adjusted for iHD    Thank you for allowing pharmacy to be a part of this patient's care.  Greta Doom BS, PharmD, BCPS Clinical Pharmacist 08/23/2023 8:39 AM  Contact: [phone] after 3 PM  "Be curious, not judgmental..." -[name]

## 2023-08-23 NOTE — Progress Notes (Addendum)
SLP Cancellation Note  Patient Details Name: [name] [name] MRN: [id number] DOB: Dec 07, 1950   Cancelled treatment:       Reason Eval/Treat Not Completed: Pt seen 11/13 by SLP with no s/s of oropharyngeal dysphagia or aspiration. She presents with significant signs of esophageal dysphagia (regurgitation, eructation, c/o reflux) and this SLP suspects pt's most significant risk of aspiration is post-prandially or due to regurgitation. Discussed with MD, who agrees further esophageal assessment is likely warranted prior to a repeat SLP swallowing evaluation. Please re-consult if indicated.    Gwynneth Aliment, M.A., CF-SLP Speech Language Pathology, Acute Rehabilitation Services  Secure Chat preferred [phone]  08/23/2023, 1:09 PM

## 2023-08-23 NOTE — Progress Notes (Signed)
RT attempted to place BIPAP on patient while patient was in HD, patient stated that she could not breathe. BIPAP mask was removed and patient was placed on 12L salter.

## 2023-08-24 ENCOUNTER — Inpatient Hospital Stay (HOSPITAL_COMMUNITY): Payer: 59

## 2023-08-24 DIAGNOSIS — R7989 Other specified abnormal findings of blood chemistry: Secondary | ICD-10-CM | POA: Diagnosis not present

## 2023-08-24 DIAGNOSIS — J9600 Acute respiratory failure, unspecified whether with hypoxia or hypercapnia: Secondary | ICD-10-CM | POA: Diagnosis not present

## 2023-08-24 DIAGNOSIS — I509 Heart failure, unspecified: Secondary | ICD-10-CM | POA: Diagnosis not present

## 2023-08-24 DIAGNOSIS — G9340 Encephalopathy, unspecified: Secondary | ICD-10-CM | POA: Diagnosis not present

## 2023-08-24 LAB — RENAL FUNCTION PANEL
Albumin: 2.9 g/dL — ABNORMAL LOW (ref 3.5–5.0)
Anion gap: 11 (ref 5–15)
BUN: 49 mg/dL — ABNORMAL HIGH (ref 8–23)
CO2: 29 mmol/L (ref 22–32)
Calcium: 9 mg/dL (ref 8.9–10.3)
Chloride: 94 mmol/L — ABNORMAL LOW (ref 98–111)
Creatinine, Ser: 6.08 mg/dL — ABNORMAL HIGH (ref 0.44–1.00)
GFR, Estimated: 7 mL/min — ABNORMAL LOW (ref >60)
Glucose, Bld: 126 mg/dL — ABNORMAL HIGH (ref 70–99)
Phosphorus: 4.1 mg/dL (ref 2.5–4.6)
Potassium: 3.7 mmol/L (ref 3.5–5.1)
Sodium: 134 mmol/L — ABNORMAL LOW (ref 135–145)

## 2023-08-24 LAB — CBC
HCT: 25.7 % — ABNORMAL LOW (ref 36.0–46.0)
Hemoglobin: 7.6 g/dL — ABNORMAL LOW (ref 12.0–15.0)
MCH: 31.5 pg (ref 26.0–34.0)
MCHC: 29.6 g/dL — ABNORMAL LOW (ref 30.0–36.0)
MCV: 106.6 fL — ABNORMAL HIGH (ref 80.0–100.0)
Platelets: 168 10*3/uL (ref 150–400)
RBC: 2.41 MIL/uL — ABNORMAL LOW (ref 3.87–5.11)
RDW: 15.4 % (ref 11.5–15.5)
WBC: 8.5 10*3/uL (ref 4.0–10.5)
nRBC: 2.1 % — ABNORMAL HIGH (ref 0.0–0.2)

## 2023-08-24 LAB — GLUCOSE, CAPILLARY
Glucose-Capillary: 136 mg/dL — ABNORMAL HIGH (ref 70–99)
Glucose-Capillary: 141 mg/dL — ABNORMAL HIGH (ref 70–99)

## 2023-08-24 MED ORDER — HEPARIN SODIUM (PORCINE) 1000 UNIT/ML DIALYSIS
2000.0000 [IU] | Freq: Once | INTRAMUSCULAR | Status: AC
  Administered 2023-08-24: 2000 [IU] via INTRAVENOUS_CENTRAL
  Filled 2023-08-24: qty 2

## 2023-08-24 MED ORDER — HEPARIN SODIUM (PORCINE) 1000 UNIT/ML DIALYSIS: 1500.0000 [IU] | INTRAMUSCULAR | Status: DC | PRN

## 2023-08-24 MED ORDER — NEPRO/CARBSTEADY PO LIQD: 237.0000 mL | ORAL | Status: DC | PRN

## 2023-08-24 MED ORDER — HEPARIN SODIUM (PORCINE) 1000 UNIT/ML DIALYSIS
1000.0000 [IU] | INTRAMUSCULAR | Status: DC | PRN
Start: 2023-08-24 — End: 2023-08-24

## 2023-08-24 MED ORDER — LIDOCAINE HCL (PF) 1 % IJ SOLN
5.0000 mL | INTRAMUSCULAR | Status: DC | PRN
Start: 2023-08-24 — End: 2023-08-24

## 2023-08-24 MED ORDER — ANTICOAGULANT SODIUM CITRATE 4% (200MG/5ML) IV SOLN
5.0000 mL | Status: DC | PRN
Start: 2023-08-24 — End: 2023-08-24

## 2023-08-24 MED ORDER — LIDOCAINE-PRILOCAINE 2.5-2.5 % EX CREA
1.0000 | TOPICAL_CREAM | CUTANEOUS | Status: DC | PRN
Start: 2023-08-24 — End: 2023-08-24

## 2023-08-24 MED ORDER — ALTEPLASE 2 MG IJ SOLR: 2.0000 mg | Freq: Once | INTRAMUSCULAR | Status: DC | PRN

## 2023-08-24 MED ORDER — PENTAFLUOROPROP-TETRAFLUOROETH EX AERO: 1.0000 | INHALATION_SPRAY | CUTANEOUS | Status: DC | PRN

## 2023-08-24 NOTE — Progress Notes (Signed)
RT note. Patient currently on 5 L HFNC/salter sat 96% with no loabored breathing noted at this time. Bipap(servo) on stand by in patients room if needed at a later time. RT will continue to monitor.    08/24/23 0745  Therapy Vitals  Pulse Rate 70  Resp 18  MEWS Score/Color  MEWS Score 0  MEWS Score Color Green  Respiratory Assessment  Assessment Type Mid-treatment  Respiratory Pattern Regular;Unlabored  Chest Assessment Chest expansion symmetrical  Bilateral Breath Sounds Clear;Diminished  Oxygen Therapy/Pulse Ox  O2 Device HFNC  O2 Therapy Oxygen humidified  O2 Flow Rate (L/min) 5 L/min  SpO2 99 %

## 2023-08-24 NOTE — Plan of Care (Signed)
Pt has rested quietly throughout the night with no distress noted. Alert and oriented. On O2 6LHFNC. SR on the monitor. Right chest PICC with dressing CDI. Purwick intact to suction. Pt on Bipap about 5 hours and wanted to come off and HFNC was placed on pt. No complaints voiced.     Problem: Education: Goal: Ability to describe self-care measures that may prevent or decrease complications (Diabetes Survival Skills Education) will improve Outcome: Progressing Goal: Individualized Educational Video(s) Outcome: Progressing   Problem: Coping: Goal: Ability to adjust to condition or change in health will improve Outcome: Progressing   Problem: Fluid Volume: Goal: Ability to maintain a balanced intake and output will improve Outcome: Progressing   Problem: Health Behavior/Discharge Planning: Goal: Ability to identify and utilize available resources and services will improve Outcome: Progressing Goal: Ability to manage health-related needs will improve Outcome: Progressing   Problem: Metabolic: Goal: Ability to maintain appropriate glucose levels will improve Outcome: Progressing   Problem: Skin Integrity: Goal: Risk for impaired skin integrity will decrease Outcome: Progressing   Problem: Tissue Perfusion: Goal: Adequacy of tissue perfusion will improve Outcome: Progressing   Problem: Education: Goal: Knowledge of General Education information will improve Description: Including pain rating scale, medication(s)/side effects and non-pharmacologic comfort measures Outcome: Progressing   Problem: Health Behavior/Discharge Planning: Goal: Ability to manage health-related needs will improve Outcome: Progressing   Problem: Clinical Measurements: Goal: Ability to maintain clinical measurements within normal limits will improve Outcome: Progressing Goal: Will remain free from infection Outcome: Progressing Goal: Diagnostic test results will improve Outcome: Progressing Goal:  Respiratory complications will improve Outcome: Progressing Goal: Cardiovascular complication will be avoided Outcome: Progressing   Problem: Activity: Goal: Risk for activity intolerance will decrease Outcome: Progressing   Problem: Nutrition: Goal: Adequate nutrition will be maintained Outcome: Progressing   Problem: Coping: Goal: Level of anxiety will decrease Outcome: Progressing   Problem: Pain Management: Goal: General experience of comfort will improve Outcome: Progressing   Problem: Safety: Goal: Ability to remain free from injury will improve Outcome: Progressing   Problem: Skin Integrity: Goal: Risk for impaired skin integrity will decrease Outcome: Progressing

## 2023-08-24 NOTE — Progress Notes (Signed)
PROGRESS NOTE        PATIENT DETAILS Name: [name] [name] Age: 72 y.o. Sex: female Date of Birth: Jul 16, 1951 Admit Date: 08/20/2023 Admitting Physician Lorin Glass, MD ZOX:WRUE, [name], FNP  Brief Summary: Patient is a 72 y.o.  female with history of ESRD on HD TTS, HFrEF, COPD on home O2 3 L/min, nonobstructive CAD on recent [facility] 9/27, PAF on Eliquis, small cell cancer of lung-s/p radiation (completed April 06, 2022), OSA-nontolerant to CPAP, HTN, DM-2-who presented to the hospital on 11/12 with shortness of breath-she was found to have PNA-and subsequently admitted to the hospitalist service.  Hospital course subsequently complicated by hypercarbia requiring initiation of BiPAP and transfer to progressive care unit.    Significant events: 10/3-10/12-hospitalization for volume overload/COPD exacerbation/acute metabolic encephalopathy 11/12>> admit to [facility]-PNA 11/14>> somnolent-found to have hypercarbia-BiPAP-transfer to PCU.  Significant studies: 11/12>> CXR: Possible retrocardiac infiltrate-possible left lower lobe consolidation 11/14>> CXR: Worsening pulm edema 11/15>> CXR improved pulmonary edema.  Stable dense left lower lobe airspace disease.  Significant microbiology data: 11/12>> COVID/influenza/RSV PCR: Negative  Procedures: 11/14>> right-sided TDC catheter by IR  Consults: PCCM Nephrology  Subjective: Completely awake alert-feels better-tolerated BiPAP for around 5 hours last night.  Objective: Vitals: Blood pressure (!) 125/46, pulse 74, temperature 98.2 F (36.8 C), temperature source Oral, resp. rate 16, height 5\' 3"  (1.6 m), weight 101.3 kg, SpO2 100%.   Exam: Gen Exam:Alert awake-not in any distress HEENT:atraumatic, normocephalic Chest: B/L clear to auscultation anteriorly CVS:S1S2 regular Abdomen:soft non tender, non distended Extremities:no edema Neurology: Non focal-but has generalized weakness. Skin: no  rash  Pertinent Labs/Radiology:    Latest Ref Rng & Units 08/24/2023    4:26 AM 08/23/2023    8:39 AM 08/22/2023    5:33 AM  CBC  WBC 4.0 - 10.5 K/uL 8.5  8.9  11.7   Hemoglobin 12.0 - 15.0 g/dL 7.6  8.7  9.4   Hematocrit 36.0 - 46.0 % 25.7  29.3  34.2   Platelets 150 - 400 K/uL 168  176  235     Lab Results  Component Value Date   NA 134 (L) 08/24/2023   K 3.7 08/24/2023   CL 94 (L) 08/24/2023   CO2 29 08/24/2023      Assessment/Plan: Acute on chronic hypercarbic/hypoxic respiratory failure Multifactorial etiology-primarily due to acute pulm edema due to HFrEF exacerbation-with contributions from underlying PNA/COPD/OSA (previously noncompliant to CPAP) Much better after BiPAP and hemodialysis 11/15 Tolerated BiPAP for 5 hours hours last night-currently stable on just 5 L of oxygen. Continue to treat underlying etiologies-and slowly wean her back down to her usual home regimen of 3 L.   Discussed with case management 11/15-arrangements are ongoing to arrange for home BiPAP.  Acute metabolic encephalopathy Secondary to hypercarbia Improved with dialysis/BiPAP-completely awake and alert this morning Supportive care Delirium precautions.   LLL PNA Continue IV Unasyn for a few more days before transitioning back to oral Augmentin. . D/w SLP on 11/15-suspected esophageal origin for dysphagia-2/2 reflux-on PPI-now that her respiratory status has improved-will see if we can get a barium esophagogram-and if she remains stable-possibly will need a GI evaluation over the next several days.  COPD Continue scheduled bronchodilators No obvious wheezing-not in exacerbation but suspect still playing a role in hypoxia/hypercarbia.  History of OSA Previously noncompliant to BiPAP/CPAP However has been counseled extensively-tolerated BiPAP  5 hours last night. Continue BiPAP nightly-and as needed during the daytime when sleeping.  Discussed with case management 11/15-arrangements are  ongoing to arrange for home BiPAP.  Acute on chronic HFrEF(EF 35-40% by TTE on 04/15/2023) Volume removal with hemodialysis Continue beta-blocker-is on midodrine as well.  PAF Telemetry monitoring Continue beta-blocker Continue Eliquis Reviewed most recent cardiology note-thought not to be a candidate for long-term amiodarone-does not appear to be on amiodarone any longer.   Nonobstructive CAD on recent [facility] on 9/27 No chest pain Should not be on Plavix any longer as patient is now on Eliquis-Plavix discontinued 11/15 Continue beta-blocker/statin.     History of PAD Statin See above regarding Plavix.   ESRD on HD TTS Nephrology following and directing HD care   Normocytic anemia Secondary to combination of ESRD/critical illness Aranesp/iron deferred to nephrology service Follow CBC  Hyponatremia Mild Improved with volume removal with HD.   Hypothyroidism Synthroid TSH 10/3 stable   DM-2 (A1c 7.1 on 7/6) CBG stable-continue SSI  Recent Labs    08/23/23 1616 08/23/23 2105 08/24/23 0823  GLUCAP 179* 142* 136*    Gout Not in exacerbation Allopurinol  History of lung cancer-s/p radiation Recently seen by Dr. Fay Records PET scan planned.  Per prior imaging-some concern for recurrence of underlying cancer.   Osteoarthritis of bilateral knees Stable at present-outpatient orthopedic evaluation   Debility/deconditioning Per patient-she has not ambulated in close to a month-previously minimally ambulatory and mostly bed to wheelchair bound PT/OT eval when more stable.   Nutrition Status: Nutrition Problem: Increased nutrient needs Etiology: chronic illness (ESRD on HD) Signs/Symptoms: estimated needs Interventions: Refer to RD note for recommendations  Obesity: Estimated body mass index is 39.57 kg/m as calculated from the following:   Height as of this encounter: 5\' 3"  (1.6 m).   Weight as of this encounter: 101.3 kg.   The patient is critically  ill with multiple organ system failure and requires high complexity decision making for assessment and support, frequent evaluation and titration of therapies, advanced monitoring, review of radiographic studies and interpretation of complex data.   Code status:   Code Status: Limited: Do not attempt resuscitation (DNR) -DNR-LIMITED -Do Not Intubate/DNI    DVT Prophylaxis: apixaban (ELIQUIS) tablet 5 mg    Family Communication: Son at bedside 11/15.   Disposition Plan: Status is: Inpatient Remains inpatient appropriate because: Severity of illness   Planned Discharge Destination:Home   Diet: Diet Order             Diet renal/carb modified with fluid restriction Diet-HS Snack? Nothing; Fluid restriction: 1200 mL Fluid; Room service appropriate? Yes; Fluid consistency: Thin  Diet effective now                     Antimicrobial agents: Anti-infectives (From admission, onward)    Start     Dose/Rate Route Frequency Ordered Stop   08/23/23 0930  Ampicillin-Sulbactam (UNASYN) 3 g in sodium chloride 0.9 % 100 mL IVPB        3 g 200 mL/hr over 30 Minutes Intravenous Every 12 hours 08/23/23 0838     08/21/23 2200  amoxicillin-clavulanate (AUGMENTIN) 500-125 MG per tablet 1 tablet  Status:  Discontinued        1 tablet Oral Daily at bedtime 08/21/23 1611 08/23/23 0802   08/20/23 1100  Ampicillin-Sulbactam (UNASYN) 3 g in sodium chloride 0.9 % 100 mL IVPB  Status:  Discontinued        3 g 200 mL/hr  over 30 Minutes Intravenous Daily 08/20/23 1036 08/21/23 1611        MEDICATIONS: Scheduled Meds:  allopurinol  100 mg Oral Daily   apixaban  5 mg Oral BID   arformoterol  15 mcg Nebulization BID   budesonide (PULMICORT) nebulizer solution  0.5 mg Nebulization BID   Chlorhexidine Gluconate Cloth  6 each Topical Q0600   Chlorhexidine Gluconate Cloth  6 each Topical Q0600   insulin aspart  0-5 Units Subcutaneous QHS   insulin aspart  0-9 Units Subcutaneous TID WC    leptospermum manuka honey  1 Application Topical Daily   levothyroxine  175 mcg Oral Q0600   metoprolol tartrate  25 mg Oral BID   midodrine  10 mg Oral Q8H   pantoprazole  40 mg Oral BID AC   revefenacin  175 mcg Nebulization Daily   rosuvastatin  20 mg Oral Daily   sevelamer carbonate  1,600 mg Oral TID WC   sodium chloride flush  10-40 mL Intracatheter Q12H   Continuous Infusions:  ampicillin-sulbactam (UNASYN) IV 3 g (08/24/23 0859)   PRN Meds:.acetaminophen **OR** acetaminophen, albuterol, hydrALAZINE, meclizine, sodium chloride flush   I have personally reviewed following labs and imaging studies  LABORATORY DATA: CBC: Recent Labs  Lab 08/20/23 0456 08/20/23 0651 08/20/23 0657 08/21/23 0402 08/22/23 0533 08/23/23 0839 08/24/23 0426  WBC 7.1  --   --  6.4 11.7* 8.9 8.5  NEUTROABS 5.8  --   --   --  10.2* 7.8*  --   HGB 9.1*   < > 10.9* 10.2* 9.4* 8.7* 7.6*  HCT 32.8*   < > 32.0* 35.4* 34.2* 29.3* 25.7*  MCV 109.3*  --   --  107.9* 112.5* 107.7* 106.6*  PLT 187  --   --  204 235 176 168   < > = values in this interval not displayed.    Basic Metabolic Panel: Recent Labs  Lab 08/20/23 1147 08/21/23 0402 08/21/23 1639 08/22/23 0533 08/23/23 0839 08/24/23 0426  NA 135 134*  --  129* 135 134*  K 5.3* 4.7  --  5.0 3.6 3.7  CL 93* 94*  --  88* 93* 94*  CO2 26 26  --  28 29 29   GLUCOSE 273* 128*  --  224* 132* 126*  BUN 53* 36*  --  57* 36* 49*  CREATININE 6.94* 5.84*  --  7.50* 4.94* 6.08*  CALCIUM 9.2 9.4  --  9.3 9.0 9.0  PHOS  --   --  7.0*  --  4.1 4.1    GFR: Estimated Creatinine Clearance: 9.5 mL/min (A) (by C-G formula based on SCr of 6.08 mg/dL (H)).  Liver Function Tests: Recent Labs  Lab 08/23/23 0839 08/24/23 0426  ALBUMIN 3.2* 2.9*   No results for input(s): "LIPASE", "AMYLASE" in the last 168 hours. No results for input(s): "AMMONIA" in the last 168 hours.  Coagulation Profile: No results for input(s): "INR", "PROTIME" in the last 168  hours.  Cardiac Enzymes: No results for input(s): "CKTOTAL", "CKMB", "CKMBINDEX", "TROPONINI" in the last 168 hours.  BNP (last 3 results) No results for input(s): "PROBNP" in the last 8760 hours.  Lipid Profile: No results for input(s): "CHOL", "HDL", "LDLCALC", "TRIG", "CHOLHDL", "LDLDIRECT" in the last 72 hours.  Thyroid Function Tests: No results for input(s): "TSH", "T4TOTAL", "FREET4", "T3FREE", "THYROIDAB" in the last 72 hours.  Anemia Panel: No results for input(s): "VITAMINB12", "FOLATE", "FERRITIN", "TIBC", "IRON", "RETICCTPCT" in the last 72 hours.  Urine analysis:  Component Value Date/Time   COLORURINE YELLOW 11/27/2021 0956   APPEARANCEUR HAZY (A) 11/27/2021 0956   LABSPEC 1.012 11/27/2021 0956   PHURINE 5.0 11/27/2021 0956   GLUCOSEU NEGATIVE 11/27/2021 0956   HGBUR NEGATIVE 11/27/2021 0956   BILIRUBINUR NEGATIVE 11/27/2021 0956   KETONESUR NEGATIVE 11/27/2021 0956   PROTEINUR 30 (A) 11/27/2021 0956   UROBILINOGEN 0.2 08/04/2014 1409   NITRITE NEGATIVE 11/27/2021 0956   LEUKOCYTESUR NEGATIVE 11/27/2021 0956    Sepsis Labs: Lactic Acid, Venous    Component Value Date/Time   LATICACIDVEN 0.9 07/11/2023 1334    MICROBIOLOGY: Recent Results (from the past 240 hour(s))  Resp panel by RT-PCR (RSV, Flu A&B, Covid) Anterior Nasal Swab     Status: None   Collection Time: 08/20/23  4:56 AM   Specimen: Anterior Nasal Swab  Result Value Ref Range Status   SARS Coronavirus 2 by RT PCR NEGATIVE NEGATIVE Final   Influenza A by PCR NEGATIVE NEGATIVE Final   Influenza B by PCR NEGATIVE NEGATIVE Final    Comment: (NOTE) The Xpert Xpress SARS-CoV-2/FLU/RSV plus assay is intended as an aid in the diagnosis of influenza from Nasopharyngeal swab specimens and should not be used as a sole basis for treatment. Nasal washings and aspirates are unacceptable for Xpert Xpress SARS-CoV-2/FLU/RSV testing.  Fact Sheet for  Patients: BloggerCourse.com  Fact Sheet for Healthcare Providers: SeriousBroker.it  This test is not yet approved or cleared by the [place] FDA and has been authorized for detection and/or diagnosis of SARS-CoV-2 by FDA under an Emergency Use Authorization (EUA). This EUA will remain in effect (meaning this test can be used) for the duration of the COVID-19 declaration under Section 564(b)(1) of the Act, 21 U.S.C. section 360bbb-3(b)(1), unless the authorization is terminated or revoked.     Resp Syncytial Virus by PCR NEGATIVE NEGATIVE Final    Comment: (NOTE) Fact Sheet for Patients: BloggerCourse.com  Fact Sheet for Healthcare Providers: SeriousBroker.it  This test is not yet approved or cleared by the [place] FDA and has been authorized for detection and/or diagnosis of SARS-CoV-2 by FDA under an Emergency Use Authorization (EUA). This EUA will remain in effect (meaning this test can be used) for the duration of the COVID-19 declaration under Section 564(b)(1) of the Act, 21 U.S.C. section 360bbb-3(b)(1), unless the authorization is terminated or revoked.  Performed at [facility], [address] [place], [place] [address]     RADIOLOGY STUDIES/RESULTS: DG Chest Port 1V same Day  Result Date: 08/23/2023 CLINICAL DATA:  [id number] with shortness of breath. EXAM: PORTABLE CHEST 1 VIEW COMPARISON:  Portable chest yesterday at 8:18 a.m. FINDINGS: 7:01 a.m. There is a new right IJ infusion catheter with the tip about the superior cavoatrial junction. There is no pneumothorax. There are stable dense left lower lobe airspace disease and small left pleural effusion. The heart is enlarged. Perihilar vascular congestion and mild central edema are still seen but improved, with regression of subpleural edema from the peripheral lungs to the center. The right sulci are  sharp. The mediastinum is stable with aortic atherosclerosis. Patient rotated to the left. IMPRESSION: 1. New right IJ infusion catheter with the tip about the superior cavoatrial junction. No pneumothorax. 2. Cardiomegaly with perihilar vascular congestion and mild central edema, improved. 3. Stable dense left lower lobe airspace disease and small left pleural effusion. Electronically Signed   By: Almira Bar M.D.   On: 08/23/2023 07:28   IR Fluoro Guide CV Line Right  Result  Date: 08/22/2023 INDICATION: Poor IV access.  ESRD on HD via a fistula.  Rapid response. EXAM: ULTRASOUND AND FLUOROSCOPIC GUIDED PLACEMENT OF TUNNELED CENTRAL VENOUS CATHETER MEDICATIONS: None. ANESTHESIA/SEDATION: Local anesthetic was administered. The patient was continuously monitored during the procedure by the interventional radiology nurse under my direct supervision. FLUOROSCOPY TIME:  Fluoroscopic dose; 2 mGy COMPLICATIONS: None immediate. PROCEDURE: Informed written consent was obtained from the patient and/or patient's representative after a discussion of the risks, benefits, and alternatives to treatment. Questions regarding the procedure were encouraged and answered. The RIGHT neck and chest were prepped with chlorhexidine in a sterile fashion, and a sterile drape was applied covering the operative field. Maximum barrier sterile technique with sterile gowns and gloves were used for the procedure. A timeout was performed prior to the initiation of the procedure. After the overlying soft tissues were anesthetized, a small venotomy incision was created and a micropuncture kit was utilized to access the internal jugular vein. Real-time ultrasound guidance was utilized for vascular access including the acquisition of a permanent ultrasound image documenting patency of the accessed vessel. The microwire was utilized to measure appropriate catheter length. The micropuncture sheath was exchanged for a peel-away sheath over a  guidewire. A 5 Fr dual lumen tunneled central venous catheter measuring 22 cm was tunneled in a retrograde fashion from the anterior chest wall to the venotomy incision. The catheter was then placed through the peel-away sheath with tip ultimately positioned at the superior caval-atrial junction. Final catheter positioning was confirmed and documented with a spot radiographic image. The catheter aspirates and flushes normally. The catheter was flushed with appropriate volume heparin dwells. The catheter exit site was secured with a 0-Prolene retention suture. The venotomy incision was closed with Dermabond. Dressings were applied. The patient tolerated the procedure well without immediate post procedural complication. FINDINGS: After catheter placement, the tip lies within the superior cavoatrial junction. The catheter aspirates and flushes normally and is ready for immediate use. IMPRESSION: Successful placement of 22 cm dual lumen tunneled central venous "Powerline" catheter via the RIGHT internal jugular vein The tip of the catheter is positioned at the superior cavo-atrial junction. The catheter is ready for immediate use. Roanna Banning, MD [facility] Radiology Electronically Signed   By: Roanna Banning M.D.   On: 08/22/2023 14:57   IR US Guide Vasc Access Right  Result Date: 08/22/2023 INDICATION: Poor IV access.  ESRD on HD via a fistula.  Rapid response. EXAM: ULTRASOUND AND FLUOROSCOPIC GUIDED PLACEMENT OF TUNNELED CENTRAL VENOUS CATHETER MEDICATIONS: None. ANESTHESIA/SEDATION: Local anesthetic was administered. The patient was continuously monitored during the procedure by the interventional radiology nurse under my direct supervision. FLUOROSCOPY TIME:  Fluoroscopic dose; 2 mGy COMPLICATIONS: None immediate. PROCEDURE: Informed written consent was obtained from the patient and/or patient's representative after a discussion of the risks, benefits, and  alternatives to treatment. Questions regarding the procedure were encouraged and answered. The RIGHT neck and chest were prepped with chlorhexidine in a sterile fashion, and a sterile drape was applied covering the operative field. Maximum barrier sterile technique with sterile gowns and gloves were used for the procedure. A timeout was performed prior to the initiation of the procedure. After the overlying soft tissues were anesthetized, a small venotomy incision was created and a micropuncture kit was utilized to access the internal jugular vein. Real-time ultrasound guidance was utilized for vascular access including the acquisition of a permanent ultrasound image documenting patency of the accessed vessel. The microwire was  utilized to measure appropriate catheter length. The micropuncture sheath was exchanged for a peel-away sheath over a guidewire. A 5 Fr dual lumen tunneled central venous catheter measuring 22 cm was tunneled in a retrograde fashion from the anterior chest wall to the venotomy incision. The catheter was then placed through the peel-away sheath with tip ultimately positioned at the superior caval-atrial junction. Final catheter positioning was confirmed and documented with a spot radiographic image. The catheter aspirates and flushes normally. The catheter was flushed with appropriate volume heparin dwells. The catheter exit site was secured with a 0-Prolene retention suture. The venotomy incision was closed with Dermabond. Dressings were applied. The patient tolerated the procedure well without immediate post procedural complication. FINDINGS: After catheter placement, the tip lies within the superior cavoatrial junction. The catheter aspirates and flushes normally and is ready for immediate use. IMPRESSION: Successful placement of 22 cm dual lumen tunneled central venous "Powerline" catheter via the RIGHT internal jugular vein The tip of the catheter is positioned at the superior cavo-atrial  junction. The catheter is ready for immediate use. Roanna Banning, MD [facility] Radiology Electronically Signed   By: Roanna Banning M.D.   On: 08/22/2023 14:57     LOS: 4 days   Jeoffrey Massed, MD  [facility]    To contact the attending provider between 7A-7P or the covering provider during after hours 7P-7A, please log into the web site [email or web address] and access using universal  password for that web site. If you do not have the password, please call the hospital operator.  08/24/2023, 9:21 AM

## 2023-08-24 NOTE — Evaluation (Signed)
Physical Therapy Evaluation Patient Details Name: [name] [name] MRN: [id number] DOB: October 05, 1951 Today's Date: 08/24/2023  History of Present Illness  The pt is a 72 yo female presenting 08/20/23 with respiratory failure due to PNA.  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  Clinical Impression  Pt admitted with above diagnosis. Pt from home with son and reports she has not been getting therapy and really wants to get to the place that she can transfer again. She reports she was working on standing last time she was at [facility]. With OT and PT she was able to tolerate sitting EOB and then perform sit>stand with max A +2 to stedy. Pt remained in perched position in stedy 10 mins before returning to bed. Pt very encouraged to be in upright position and off her back. SPO2 remained 98% on 5L O2. Patient will benefit from continued inpatient follow up therapy, <3 hours/day.  Pt currently with functional limitations due to the deficits listed below (see PT Problem List). Pt will benefit from acute skilled PT to increase their independence and safety with mobility to allow discharge.           If plan is discharge home, recommend the following: A lot of help with bathing/dressing/bathroom;Assistance with cooking/housework;Assist for transportation;Two people to help with walking and/or transfers   Can travel by private vehicle   No    Equipment Recommendations None recommended by PT  Recommendations for Other Services       Functional Status Assessment Patient has had a recent decline in their functional status and demonstrates the ability to make significant improvements in function in a reasonable and predictable amount of time.     Precautions /  Restrictions Precautions Precautions: Fall Precaution Comments: watch sats and bp Restrictions Weight Bearing Restrictions: No      Mobility  Bed Mobility Overal bed mobility: Needs Assistance Bed Mobility: Rolling, Supine to Sit, Sit to Supine Rolling: Used rails, Mod assist   Supine to sit: HOB elevated, Used rails, +2 for physical assistance, Mod assist Sit to supine: Max assist, +2 for physical assistance   General bed mobility comments: pt initiates with use of rail, needs mod A for trunk elevation and scooting hips to EOB    Transfers Overall transfer level: Needs assistance Equipment used: Ambulation equipment used Transfers: Sit to/from Stand Sit to Stand: Max assist, +2 physical assistance           General transfer comment: max A +2 with use of pad for sit>stand from elevated bed and then stand from flaps of stedy. Unable to achieve standing on first attempt from bed but pt very motivated to get up an succeeded second time Transfer via Lift Equipment: Stedy  Ambulation/Gait               General Gait Details: unable at baseline  Stairs            Wheelchair Mobility     Tilt Bed    Modified Rankin (Stroke Patients Only)       Balance Overall balance assessment: Needs assistance Sitting-balance support: Single extremity supported, Feet supported Sitting balance-Leahy Scale: Fair     Standing balance support: Bilateral upper extremity supported, During functional activity  Standing balance-Leahy Scale: Zero Standing balance comment: needs UE support and max A to maintain standing                             Pertinent Vitals/Pain Pain Assessment Pain Assessment: No/denies pain    Home Living Family/patient expects to be discharged to:: Private residence Living Arrangements: Children (son) Available Help at Discharge: Family;Available PRN/intermittently;Personal care attendant Type of Home: Apartment Home Access: Ramped  entrance       Home Layout: One level Home Equipment: Tub bench;Grab bars - tub/shower;Grab bars - toilet;Rolling Walker (2 wheels);BSC/3in1;Hospital bed;Wheelchair - Engineer, technical sales - power;Other (comment) Additional Comments: hoyer lift. has been unable to stand since August. hoyer lift to [facility]. PCA 6 days a week    Prior Function Prior Level of Function : Needs assist       Physical Assist : Mobility (physical);ADLs (physical) Mobility (physical): Bed mobility;Transfers;Gait ADLs (physical): Bathing;Dressing;Toileting;IADLs Mobility Comments: hoyer for OOB, son assists with rolling ADLs Comments: PCA helps with ADLs and IADLs     Extremity/Trunk Assessment   Upper Extremity Assessment Upper Extremity Assessment: Defer to OT evaluation RUE Deficits / Details: chronic R shoulder impairment    Lower Extremity Assessment Lower Extremity Assessment: Generalized weakness RLE Deficits / Details: knee ext 2+/5, knee buckles with resistance. 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 and needs to have an injection LLE Deficits / Details: grossly 2/5 knee flexion and extension    Cervical / Trunk Assessment Cervical / Trunk Assessment: Kyphotic  Communication   Communication Communication: No apparent difficulties Cueing Techniques: Verbal cues  Cognition Arousal: Alert Behavior During Therapy: Flat affect Overall Cognitive Status: No family/caregiver present to determine baseline cognitive functioning Area of Impairment: Following commands, Problem solving                       Following Commands: Follows one step commands with increased time, Follows multi-step commands with increased time     Problem Solving: Slow processing General Comments: pt initially slow to respond but once upright in stedy pt became much more animated and responding close to normal speed.        General Comments General comments (skin integrity, edema, etc.): pt elated to be able to get  off bed, she reports she was afraid she would not stand again and she wants to be more mobile. SPO2 remained 98% on 5L O2    Exercises     Assessment/Plan    PT Assessment Patient needs continued PT services  PT Problem List Decreased strength;Decreased mobility;Decreased activity tolerance;Decreased balance;Decreased knowledge of use of DME;Obesity       PT Treatment Interventions DME instruction;Gait training;Functional mobility training;Therapeutic activities;Patient/family education;Balance training;Therapeutic exercise    PT Goals (Current goals can be found in the Care Plan section)  Acute Rehab PT Goals Patient Stated Goal: be able to stand PT Goal Formulation: With patient Time For Goal Achievement: 09/07/23 Potential to Achieve Goals: Fair    Frequency Min 1X/week     Co-evaluation PT/OT/SLP Co-Evaluation/Treatment: Yes Reason for Co-Treatment: Complexity of the patient's impairments (multi-system involvement);For patient/therapist safety;To address functional/ADL transfers PT goals addressed during session: Mobility/safety with mobility;Balance;Proper use of DME;Strengthening/ROM OT goals addressed during session: ADL's and self-care       AM-PAC PT "6 Clicks" Mobility  Outcome Measure Help needed turning from your back to your side while in a flat bed without using bedrails?: A Little Help  needed moving from lying on your back to sitting on the side of a flat bed without using bedrails?: A Lot Help needed moving to and from a bed to a chair (including a wheelchair)?: Total Help needed standing up from a chair using your arms (e.g., wheelchair or bedside chair)?: Total Help needed to walk in hospital room?: Total Help needed climbing 3-5 steps with a railing? : Total 6 Click Score: 9    End of Session Equipment Utilized During Treatment: Oxygen Activity Tolerance: Patient tolerated treatment well Patient left: with call bell/phone within reach;in bed Nurse  Communication: Mobility status PT Visit Diagnosis: Other abnormalities of gait and mobility (R26.89);Muscle weakness (generalized) (M62.81)    Time: 0865-7846 PT Time Calculation (min) (ACUTE ONLY): 38 min   Charges:   PT Evaluation $PT Eval Moderate Complexity: 1 Mod   PT General Charges $$ ACUTE PT VISIT: 1 Visit         Lyanne Co, PT  Acute Rehab Services Secure chat preferred Office [phone]   Lawana Chambers Chavonne Sforza 08/24/2023, 1:59 PM

## 2023-08-24 NOTE — Progress Notes (Signed)
   08/24/23 2257  BiPAP/CPAP/SIPAP  $ Non-Invasive Ventilator  Non-Invasive Vent Subsequent  BiPAP/CPAP/SIPAP Pt Type Adult  BiPAP/CPAP/SIPAP SERVO  Mask Type Full face mask  Mask Size Medium  Set Rate 22 breaths/min  Respiratory Rate 22 breaths/min  IPAP 5 cmH20  EPAP 17 cmH2O  Pressure Support 12 cmH20  FiO2 (%) 40 %  Minute Ventilation 10  Leak 15  Peak Inspiratory Pressure (PIP) 16  Tidal Volume (Vt) 467  Patient Home Equipment No  Auto Titrate No  Press High Alarm 25 cmH2O  Press Low Alarm 5 cmH2O  CPAP/SIPAP surface wiped down Yes   Pt sleeping comfortably with bipap on.  RT will continue to monitor

## 2023-08-24 NOTE — Evaluation (Signed)
Occupational Therapy Evaluation Patient Details Name: [name] [name] MRN: [id number] DOB: November 09, 1950 Today's Date: 08/24/2023   History of Present Illness The pt is a 72 yo female presenting 08/20/23 with respiratory failure due to PNA.  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   Clinical Impression   Pt admitted for above, currently needs 5L 02 to complete functional activities. Pt initially very flat and at times seemed spaced out but became more perky following standing, but needs Max A +2 from therapists using Stedy. Pt demonstrating need for  Max to CGA for ADLs and presents as generally weak in BUEs/BLEs. Pt would benefit from continued acute skilled OT services to address deficits and help transition to next level of care. Patient would benefit from post acute skilled rehab facility with <3 hours of therapy and 24/7 support       If plan is discharge home, recommend the following: A lot of help with walking and/or transfers;Two people to help with walking and/or transfers;A lot of help with bathing/dressing/bathroom;Assistance with cooking/housework;Assist for transportation;Supervision due to cognitive status    Functional Status Assessment  Patient has had a recent decline in their functional status and demonstrates the ability to make significant improvements in function in a reasonable and predictable amount of time.  Equipment Recommendations  None recommended by OT (defer to next level of care)    Recommendations for Other Services       Precautions / Restrictions Precautions Precautions: Fall Precaution Comments: watch sats and bp Restrictions Weight Bearing Restrictions: No      Mobility Bed Mobility Overal bed  mobility: Needs Assistance Bed Mobility: Rolling, Supine to Sit, Sit to Supine Rolling: Used rails, Mod assist   Supine to sit: HOB elevated, Used rails, +2 for physical assistance, Mod assist Sit to supine: Max assist, +2 for physical assistance   General bed mobility comments: pt initiates with use of rail, needs mod A for trunk elevation and scooting hips to EOB    Transfers Overall transfer level: Needs assistance Equipment used: Ambulation equipment used Transfers: Sit to/from Stand Sit to Stand: Max assist, +2 physical assistance           General transfer comment: max A +2 with use of pad for sit>stand from elevated bed and then stand from flaps of stedy. Unable to achieve standing on first attempt from bed but pt very motivated to get up an succeeded second time Transfer via Lift Equipment: Stedy    Balance Overall balance assessment: Needs assistance Sitting-balance support: Single extremity supported, Feet supported Sitting balance-Leahy Scale: Fair     Standing balance support: Bilateral upper extremity supported, During functional activity Standing balance-Leahy Scale: Zero Standing balance comment: needs UE support and max A to maintain standing                           ADL either performed or assessed with clinical judgement   ADL Overall ADL's : Needs assistance/impaired Eating/Feeding: Independent;Bed level   Grooming: Wash/dry face;Contact guard assist;Sitting   Upper Body Bathing: Sitting;Contact guard assist   Lower Body Bathing: Sitting/lateral leans;Maximal assistance   Upper Body Dressing : Sitting;Moderate assistance   Lower Body Dressing: Sitting/lateral leans;Maximal  assistance     Toilet Transfer Details (indicate cue type and reason): not able at this time; hoyer at baseline. May be able to transition with Max A +2 using stedy with therapists but would need more time working with pt Toileting- Architect and Hygiene:  Maximal assistance               Vision         Perception         Praxis         Pertinent Vitals/Pain Pain Assessment Pain Assessment: No/denies pain     Extremity/Trunk Assessment Upper Extremity Assessment Upper Extremity Assessment: Generalized weakness;LUE deficits/detail RUE Deficits / Details: chronic R shoulder impairment, shoulder AROM limited to ~55* but pt able to achieve more with AAROM/PROM. elbow flexors 4/5, elbow ext 3/5 LUE Deficits / Details: Shoulder flexion 3+/5 MMT, elbow flexors 4/5, elbow ext 3/5   Lower Extremity Assessment Lower Extremity Assessment: Generalized weakness RLE Deficits / Details: knee ext 2+/5, knee buckles with resistance. 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 and needs to have an injection LLE Deficits / Details: grossly 2/5 knee flexion and extension   Cervical / Trunk Assessment Cervical / Trunk Assessment: Kyphotic   Communication Communication Communication: No apparent difficulties Cueing Techniques: Verbal cues   Cognition Arousal: Alert Behavior During Therapy: Flat affect Overall Cognitive Status: No family/caregiver present to determine baseline cognitive functioning Area of Impairment: Following commands, Problem solving                       Following Commands: Follows one step commands with increased time, Follows multi-step commands with increased time     Problem Solving: Slow processing General Comments: pt initially slow to respond but once upright in stedy pt became much more animated and responding close to normal speed.     General Comments  pt elated to be able to get off bed, she reports she was afraid she would not stand again and she wants to be more mobile. SPO2 remained 98% on 5L O2    Exercises     Shoulder Instructions      Home Living Family/patient expects to be discharged to:: Private residence Living Arrangements: Children (son) Available Help at Discharge:  Family;Available PRN/intermittently;Personal care attendant Type of Home: Apartment Home Access: Ramped entrance     Home Layout: One level     Bathroom Shower/Tub: Producer, television/film/video: Handicapped height Bathroom Accessibility: Yes   Home Equipment: Tub bench;Grab bars - tub/shower;Grab bars - toilet;Rolling Walker (2 wheels);BSC/3in1;Hospital bed;Wheelchair - Engineer, technical sales - power;Other (comment)   Additional Comments: hoyer lift. has been unable to stand since August. hoyer lift to [facility]. PCA 6 days a week      Prior Functioning/Environment Prior Level of Function : Needs assist       Physical Assist : Mobility (physical);ADLs (physical) Mobility (physical): Bed mobility;Transfers;Gait ADLs (physical): Bathing;Dressing;Toileting;IADLs Mobility Comments: hoyer for OOB, son assists with rolling ADLs Comments: PCA helps with ADLs and IADLs        OT Problem List: Decreased strength;Impaired balance (sitting and/or standing);Obesity;Cardiopulmonary status limiting activity      OT Treatment/Interventions: Self-care/ADL training;Therapeutic exercise;Therapeutic activities;Patient/family education;Balance training    OT Goals(Current goals can be found in the care plan section) Acute Rehab OT Goals Patient Stated Goal: To get stronger to be more mobile OT Goal Formulation: With patient Time For Goal Achievement: 09/07/23 Potential to Achieve Goals: Fair ADL Goals  Pt Will Perform Grooming: with set-up;with supervision;sitting Pt Will Perform Lower Body Bathing: with min assist;sitting/lateral leans Pt Will Perform Upper Body Dressing: with set-up;sitting Pt Will Transfer to Toilet: with min assist;with +2 assist (with Corene Cornea) Pt/caregiver will Perform Home Exercise Program: Increased strength;Both right and left upper extremity;With theraband;With Supervision Additional ADL Goal #2: Pt will complete rolling L<>R in bed with Min A to assist with bed pan  positioning until able to progress to toilet transfers  OT Frequency: Min 1X/week    Co-evaluation PT/OT/SLP Co-Evaluation/Treatment: Yes Reason for Co-Treatment: Complexity of the patient's impairments (multi-system involvement);For patient/therapist safety;To address functional/ADL transfers PT goals addressed during session: Mobility/safety with mobility;Balance;Proper use of DME;Strengthening/ROM OT goals addressed during session: ADL's and self-care      AM-PAC OT "6 Clicks" Daily Activity     Outcome Measure Help from another person eating meals?: None Help from another person taking care of personal grooming?: A Little Help from another person toileting, which includes using toliet, bedpan, or urinal?: A Lot Help from another person bathing (including washing, rinsing, drying)?: A Lot Help from another person to put on and taking off regular upper body clothing?: A Lot Help from another person to put on and taking off regular lower body clothing?: A Lot 6 Click Score: 15   End of Session Equipment Utilized During Treatment: Other (comment) Antony Salmon) Nurse Communication: Mobility status  Activity Tolerance: Patient tolerated treatment well Patient left: in bed;with call bell/phone within reach;with bed alarm set  OT Visit Diagnosis: Other abnormalities of gait and mobility (R26.89);Muscle weakness (generalized) (M62.81)                Time: 1610-9604 OT Time Calculation (min): 32 min Charges:  OT General Charges $OT Visit: 1 Visit OT Evaluation $OT Eval Moderate Complexity: 1 Mod  08/24/2023  AB, OTR/L  Acute Rehabilitation Services  Office: [phone]   Tristan Schroeder 08/24/2023, 2:40 PM

## 2023-08-24 NOTE — Progress Notes (Signed)
[facility] Progress Note  Subjective: remains alert and in no distress, on 4L [place] O2. No c/o's this am.   Vitals:   08/23/23 1952 08/24/23 0035 08/24/23 0454 08/24/23 0534  BP:  (!) 114/56 (!) 125/46   Pulse:  76 73   Resp: 20 18 20    Temp:  98.8 F (37.1 C) 98.2 F (36.8 C)   TempSrc:  Oral Oral   SpO2:  96% 99%   Weight:    101.3 kg  Height:        Exam: Gen alert, not in distress, 4 L [place] No jvd or bruits Chest clear bilat to bases RRR no RG Abd soft ntnd no mass or ascites +bs MS no joint effusions or deformity Ext no LE edema Neuro is alert, nf    LUA AVG+bruit           Renal-related home meds: - metoprolol 25 bid - home O2 3 L - eliquis 5 bid - midodrine 15 tid - lyrica 25 qam - renvela 2 ac tid    OP HD: [place] TTS  4h   400/600   97.5kg  2/2 bath  LUA AVG  Heparin 2000 - last OP HD 11/09, post wt 101.3kg (+4 kg) - coming off 2-4kg over the last 2 wks - hectorol 3 mcg  - mircera 100 mcg IV q 2, last 11/2, due 11/16     Assessment/ Plan: 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 BNP was up at 1345. Has not been getting to dry wt last 2 wks. Had HD 11/12 here w/ 2.5 L UF. Pt decompensated 11/14 and required bipap for poor mental status and severe hypercarbia. CXR 11/14 showed early edema and pt had HD w/ 2.5 L off w/ stable BP's. Post HD the O2 was weaned down to 4 L [place]. Remains on 4L [place] today.   IV access: very difficult, IR placed a tunneled PICC for IV access.  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 No missed HD. Had HD here 11/12 as above. Next HD was 11/14 overnight. Plan HD today.  BP - on midodrine 10 tid for now. BP's okay  Volume - repeat CXR 11/14 showed early edema. No LE edema. Wt's are up 3 kg. Got vol down w/ HD thursday, will cont to lower vol as tol w/ hd today.  Anemia of eskd - Hb 11, next esa due on 11/16. Follow.  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 Cont binders.  COPD - on home O2 3 L [place]     Rob Jakeisha Stricker MD  [facility] 08/24/2023, 7:02 AM  Recent Labs  Lab 08/23/23 0839 08/24/23 0426  HGB 8.7* 7.6*  ALBUMIN 3.2* 2.9*  CALCIUM 9.0 9.0  PHOS 4.1 4.1  CREATININE 4.94* 6.08*  K 3.6 3.7   No results for input(s): "IRON", "TIBC", "FERRITIN" in the last 168 hours. Inpatient medications:  allopurinol  100 mg Oral Daily   apixaban  5 mg Oral BID   arformoterol  15 mcg Nebulization BID   budesonide (PULMICORT) nebulizer solution  0.5 mg Nebulization BID   Chlorhexidine Gluconate Cloth  6 each Topical Q0600   Chlorhexidine Gluconate Cloth  6 each Topical Q0600   insulin aspart  0-5 Units Subcutaneous QHS   insulin aspart  0-9 Units Subcutaneous TID WC   leptospermum manuka honey  1 Application Topical Daily   levothyroxine  175 mcg Oral Q0600  metoprolol tartrate  25 mg Oral BID   midodrine  10 mg Oral Q8H   pantoprazole  40 mg Oral BID AC   revefenacin  175 mcg Nebulization Daily   rosuvastatin  20 mg Oral Daily   sevelamer carbonate  1,600 mg Oral TID WC   sodium chloride flush  10-40 mL Intracatheter Q12H    ampicillin-sulbactam (UNASYN) IV 3 g (08/23/23 2254)    acetaminophen **OR** acetaminophen, albuterol, hydrALAZINE, meclizine, sodium chloride flush

## 2023-08-24 NOTE — Progress Notes (Addendum)
Received patient in bed.Awake,alert and oriented x 4. Consent verified.  Access used .: Left AVG that worked well.  Medicine given : Heparin  2,000 units .  Duration of treatment 3.25 hours.  Uf goal 2.5 L-achieved.  Tolerated treatment: Yes .  Hand off to the patient's nurse.Back into her room with stable medical condition via transporter.

## 2023-08-25 DIAGNOSIS — J9602 Acute respiratory failure with hypercapnia: Secondary | ICD-10-CM

## 2023-08-25 LAB — GLUCOSE, CAPILLARY
Glucose-Capillary: 125 mg/dL — ABNORMAL HIGH (ref 70–99)
Glucose-Capillary: 164 mg/dL — ABNORMAL HIGH (ref 70–99)
Glucose-Capillary: 158 mg/dL — ABNORMAL HIGH (ref 70–99)
Glucose-Capillary: 181 mg/dL — ABNORMAL HIGH (ref 70–99)

## 2023-08-25 LAB — RENAL FUNCTION PANEL
Albumin: 3.1 g/dL — ABNORMAL LOW (ref 3.5–5.0)
Anion gap: 10 (ref 5–15)
BUN: 29 mg/dL — ABNORMAL HIGH (ref 8–23)
CO2: 29 mmol/L (ref 22–32)
Calcium: 9 mg/dL (ref 8.9–10.3)
Chloride: 96 mmol/L — ABNORMAL LOW (ref 98–111)
Creatinine, Ser: 4.24 mg/dL — ABNORMAL HIGH (ref 0.44–1.00)
GFR, Estimated: 11 mL/min — ABNORMAL LOW (ref >60)
Glucose, Bld: 153 mg/dL — ABNORMAL HIGH (ref 70–99)
Phosphorus: 2.9 mg/dL (ref 2.5–4.6)
Potassium: 3.6 mmol/L (ref 3.5–5.1)
Sodium: 135 mmol/L (ref 135–145)

## 2023-08-25 LAB — CBC
HCT: 28 % — ABNORMAL LOW (ref 36.0–46.0)
Hemoglobin: 8.2 g/dL — ABNORMAL LOW (ref 12.0–15.0)
MCH: 31.2 pg (ref 26.0–34.0)
MCHC: 29.3 g/dL — ABNORMAL LOW (ref 30.0–36.0)
MCV: 106.5 fL — ABNORMAL HIGH (ref 80.0–100.0)
Platelets: 197 10*3/uL (ref 150–400)
RBC: 2.63 MIL/uL — ABNORMAL LOW (ref 3.87–5.11)
RDW: 15.5 % (ref 11.5–15.5)
WBC: 8.4 10*3/uL (ref 4.0–10.5)
nRBC: 1.8 % — ABNORMAL HIGH (ref 0.0–0.2)

## 2023-08-25 MED ORDER — METOPROLOL TARTRATE 25 MG PO TABS
25.0000 mg | ORAL_TABLET | Freq: Two times a day (BID) | ORAL | Status: DC
  Administered 2023-08-25 – 2023-08-30 (×9): 25 mg via ORAL
  Filled 2023-08-25 (×9): qty 1

## 2023-08-25 MED ORDER — OXYCODONE HCL 5 MG PO TABS
5.0000 mg | ORAL_TABLET | Freq: Once | ORAL | Status: AC
Start: 2023-08-25 — End: 2023-08-25
  Administered 2023-08-25: 5 mg via ORAL
  Filled 2023-08-25: qty 1

## 2023-08-25 MED ORDER — CHLORHEXIDINE GLUCONATE CLOTH 2 % EX PADS: 6.0000 | MEDICATED_PAD | Freq: Every day | CUTANEOUS | Status: DC

## 2023-08-25 MED ORDER — HYDROXYZINE HCL 10 MG/5ML PO SYRP
10.0000 mg | ORAL_SOLUTION | Freq: Three times a day (TID) | ORAL | Status: DC | PRN
  Administered 2023-08-25: 10 mg via ORAL
  Filled 2023-08-25 (×4): qty 5

## 2023-08-25 MED ORDER — LACTATED RINGERS IV SOLN: INTRAVENOUS | Status: DC

## 2023-08-25 NOTE — Progress Notes (Signed)
   08/25/23 2334  BiPAP/CPAP/SIPAP  $ Non-Invasive Ventilator  Non-Invasive Vent Subsequent  BiPAP/CPAP/SIPAP Pt Type Adult  BiPAP/CPAP/SIPAP SERVO  Mask Type Full face mask  Mask Size Medium  Set Rate 22 breaths/min  Respiratory Rate 22 breaths/min  IPAP 17 cmH20  EPAP 5 cmH2O  FiO2 (%) 40 %  Minute Ventilation 13.7  Leak 14  Peak Inspiratory Pressure (PIP) 18  Tidal Volume (Vt) 551  Patient Home Equipment No  Auto Titrate No  Press High Alarm 25 cmH2O  Press Low Alarm 5 cmH2O  CPAP/SIPAP surface wiped down Yes   Pt sleeping comfortably on the bipap.  RT will continue to monitor

## 2023-08-25 NOTE — Progress Notes (Signed)
DBIV drawn by lab per RN

## 2023-08-25 NOTE — Progress Notes (Signed)
PROGRESS NOTE        PATIENT DETAILS Name: [name] [name] Age: 72 y.o. Sex: female Date of Birth: 09/02/1951 Admit Date: 08/20/2023 Admitting Physician Lorin Glass, MD ZOX:WRUE, [name], FNP  Brief Summary: Patient is a 72 y.o.  female with history of ESRD on HD TTS, HFrEF, COPD on home O2 3 L/min, nonobstructive CAD on recent [facility] 9/27, PAF on Eliquis, small cell cancer of lung-s/p radiation (completed April 06, 2022), OSA-nontolerant to CPAP, HTN, DM-2-who presented to the hospital on 11/12 with shortness of breath-she was found to have PNA-and subsequently admitted to the hospitalist service.  Hospital course subsequently complicated by hypercarbia requiring initiation of BiPAP and transfer to progressive care unit.    Significant events: 10/3-10/12-hospitalization for volume overload/COPD exacerbation/acute metabolic encephalopathy 11/12>> admit to TRH-PNA 11/14>> somnolent-found to have hypercarbia-BiPAP-transfer to PCU.  Significant studies: 11/12>> CXR: Possible retrocardiac infiltrate-possible left lower lobe consolidation 11/14>> CXR: Worsening pulm edema 11/15>> CXR improved pulmonary edema.  Stable dense left lower lobe airspace disease.  Significant microbiology data: 11/12>> COVID/influenza/RSV PCR: Negative  Procedures: 11/14>> right-sided TDC catheter by IR  Consults: PCCM Nephrology  Subjective:   Patient in bed, appears comfortable, denies any headache, no fever, no chest pain or pressure, no shortness of breath , no abdominal pain. No new focal weakness.   Objective: Vitals: Blood pressure 124/60, pulse 67, temperature 98.4 F (36.9 C), temperature source Oral, resp. rate (!) 27, height 5\' 3"  (1.6 m), weight 99 kg, SpO2 99%.   Exam:  Awake Alert, No new F.N deficits, Normal affect [place].AT,PERRAL Supple Neck, No JVD,   Symmetrical Chest wall movement, Good air movement bilaterally, CTAB RRR,No Gallops, Rubs or new Murmurs,   +ve B.Sounds, Abd Soft, No tenderness,   No Cyanosis, Clubbing or edema    Assessment/Plan:\  Acute on chronic hypercarbic/hypoxic respiratory failure Multifactorial etiology-primarily due to acute pulm edema due to HFrEF exacerbation-with contributions from underlying PNA/COPD/OSA (previously noncompliant to CPAP) Much better after BiPAP and hemodialysis 11/15 Awaiting nighttime BiPAP now, home BiPAP being arranged, daytime now at baseline 3 L nasal cannula oxygen.   Discussed with case management 11/15-arrangements are ongoing to arrange for home BiPAP.  SpO2: 99 % O2 Flow Rate (L/min): 3 L/min FiO2 (%): 40 %  Acute metabolic encephalopathy Secondary to hypercarbia Improved with dialysis/BiPAP-completely awake and alert this morning Supportive care Delirium precautions.   LLL PNA Continue IV Unasyn for a few more days before transitioning back to oral Augmentin. . D/w SLP on 11/15-suspected esophageal origin for dysphagia-2/2 reflux-on PPI-now that her respiratory status has improved-will see if we can get a barium esophagogram-and if she remains stable-possibly will need a GI evaluation over the next several days.  COPD Continue scheduled bronchodilators No obvious wheezing-not in exacerbation but suspect still playing a role in hypoxia/hypercarbia.  History of OSA Previously noncompliant to BiPAP/CPAP However has been counseled extensively-tolerated BiPAP 5 hours last night. Continue BiPAP nightly-and as needed during the daytime when sleeping.  Discussed with case management 11/15-arrangements are ongoing to arrange for home BiPAP.  Acute on chronic HFrEF(EF 35-40% by TTE on 04/15/2023) Volume removal with hemodialysis Continue beta-blocker-is on midodrine as well.  PAF Telemetry monitoring Continue beta-blocker Continue Eliquis Reviewed most recent cardiology note-thought not to be a candidate for long-term amiodarone-does not appear to be on amiodarone any  longer.   Nonobstructive CAD  on recent [facility] on 9/27 No chest pain Should not be on Plavix any longer as patient is now on Eliquis-Plavix discontinued 11/15 Continue beta-blocker/statin.     History of PAD Statin See above regarding Plavix.   ESRD on HD TTS Nephrology following and directing HD care   Normocytic anemia Secondary to combination of ESRD/critical illness Aranesp/iron deferred to nephrology service Follow CBC  Hyponatremia Mild Improved with volume removal with HD.   Hypothyroidism Synthroid TSH 10/3 stable   Gout Not in exacerbation Allopurinol  History of lung cancer-s/p radiation Recently seen by Dr. Fay Records PET scan planned.  Per prior imaging-some concern for recurrence of underlying cancer.   Osteoarthritis of bilateral knees Stable at present-outpatient orthopedic evaluation   Debility/deconditioning Per patient-she has not ambulated in close to a month-previously minimally ambulatory and mostly bed to wheelchair bound PT/OT eval when more stable.   Nutrition Status: Nutrition Problem: Increased nutrient needs Etiology: chronic illness (ESRD on HD) Signs/Symptoms: estimated needs Interventions: Refer to RD note for recommendations  DM-2 (A1c 7.1 on 7/6) CBG stable-continue SSI  Recent Labs    08/24/23 0823 08/24/23 1224 08/25/23 0739  GLUCAP 136* 141* 125*      Obesity:  Estimated body mass index is 38.66 kg/m follow with PCP for weight loss.  The patient is critically ill with multiple organ system failure and requires high complexity decision making for assessment and support, frequent evaluation and titration of therapies, advanced monitoring, review of radiographic studies and interpretation of complex data.   Code status:   Code Status: Limited: Do not attempt resuscitation (DNR) -DNR-LIMITED -Do Not Intubate/DNI    DVT Prophylaxis: apixaban (ELIQUIS) tablet 5 mg    Family Communication: Son at bedside  11/15.   Disposition Plan: Status is: Inpatient Remains inpatient appropriate because: Severity of illness   Planned Discharge Destination:Home   Diet: Diet Order             Diet renal/carb modified with fluid restriction Diet-HS Snack? Nothing; Fluid restriction: 1200 mL Fluid; Room service appropriate? Yes; Fluid consistency: Thin  Diet effective now                   MEDICATIONS: Scheduled Meds:  allopurinol  100 mg Oral Daily   apixaban  5 mg Oral BID   arformoterol  15 mcg Nebulization BID   budesonide (PULMICORT) nebulizer solution  0.5 mg Nebulization BID   Chlorhexidine Gluconate Cloth  6 each Topical Q0600   Chlorhexidine Gluconate Cloth  6 each Topical Q0600   insulin aspart  0-5 Units Subcutaneous QHS   insulin aspart  0-9 Units Subcutaneous TID WC   leptospermum manuka honey  1 Application Topical Daily   levothyroxine  175 mcg Oral Q0600   metoprolol tartrate  25 mg Oral BID   midodrine  10 mg Oral Q8H   pantoprazole  40 mg Oral BID AC   revefenacin  175 mcg Nebulization Daily   rosuvastatin  20 mg Oral Daily   sevelamer carbonate  1,600 mg Oral TID WC   sodium chloride flush  10-40 mL Intracatheter Q12H   Continuous Infusions:  ampicillin-sulbactam (UNASYN) IV 3 g (08/25/23 0851)   PRN Meds:.acetaminophen **OR** acetaminophen, albuterol, hydrALAZINE, meclizine   I have personally reviewed following labs and imaging studies  LABORATORY DATA:  Recent Labs  Lab 08/20/23 0456 08/20/23 0651 08/21/23 0402 08/22/23 0533 08/23/23 0839 08/24/23 0426 08/25/23 0310  WBC 7.1  --  6.4 11.7* 8.9 8.5 8.4  HGB 9.1*   < >  10.2* 9.4* 8.7* 7.6* 8.2*  HCT 32.8*   < > 35.4* 34.2* 29.3* 25.7* 28.0*  PLT 187  --  204 235 176 168 197  MCV 109.3*  --  107.9* 112.5* 107.7* 106.6* 106.5*  MCH 30.3  --  31.1 30.9 32.0 31.5 31.2  MCHC 27.7*  --  28.8* 27.5* 29.7* 29.6* 29.3*  RDW 16.0*  --  15.9* 15.4 15.5 15.4 15.5  LYMPHSABS 0.8  --   --  0.7 0.4*  --   --    MONOABS 0.4  --   --  0.7 0.6  --   --   EOSABS 0.0  --   --  0.0 0.0  --   --   BASOSABS 0.0  --   --  0.0 0.0  --   --    < > = values in this interval not displayed.    Recent Labs  Lab 08/20/23 0456 08/20/23 0651 08/20/23 1146 08/20/23 1147 08/21/23 0402 08/22/23 0533 08/23/23 0839 08/24/23 0426 08/25/23 0310  NA  --    < >  --    < > 134* 129* 135 134* 135  K  --    < >  --    < > 4.7 5.0 3.6 3.7 3.6  CL  --    < >  --    < > 94* 88* 93* 94* 96*  CO2  --   --   --    < > 26 28 29 29 29   ANIONGAP  --   --   --    < > 14 13 13 11 10   GLUCOSE  --    < >  --    < > 128* 224* 132* 126* 153*  BUN  --    < >  --    < > 36* 57* 36* 49* 29*  CREATININE  --    < >  --    < > 5.84* 7.50* 4.94* 6.08* 4.24*  ALBUMIN  --   --   --   --   --   --  3.2* 2.9* 3.1*  PROCALCITON  --   --  0.39  --   --   --   --   --   --   BNP 1,345.3*  --   --   --   --   --   --   --   --   CALCIUM  --   --   --    < > 9.4 9.3 9.0 9.0 9.0   < > = values in this interval not displayed.     Recent Labs  Lab 08/20/23 0456 08/20/23 1146 08/20/23 1147 08/21/23 0402 08/22/23 0533 08/23/23 0839 08/24/23 0426 08/25/23 0310  PROCALCITON  --  0.39  --   --   --   --   --   --   BNP 1,345.3*  --   --   --   --   --   --   --   CALCIUM  --   --    < > 9.4 9.3 9.0 9.0 9.0   < > = values in this interval not displayed.      RADIOLOGY STUDIES/RESULTS: DG Chest Port 1 View  Result Date: 08/24/2023 CLINICAL DATA:  Shortness of breath EXAM: PORTABLE CHEST 1 VIEW COMPARISON:  08/23/2023 FINDINGS: Right IJ central line remains in place. Stable cardiomegaly. Pulmonary vascular congestion. Similar perihilar and bibasilar interstitial opacities.  Small bilateral pleural effusions, similar to slightly increased. No appreciable pneumothorax. IMPRESSION: 1. Cardiomegaly with pulmonary vascular congestion and interstitial edema. 2. Small bilateral pleural effusions, similar to slightly increased. Electronically  Signed   By: Duanne Guess D.O.   On: 08/24/2023 11:32     LOS: 5 days   Signature  -    Susa Raring M.D on 08/25/2023 at 10:15 AM   -  To page go to [email or web address]

## 2023-08-25 NOTE — Plan of Care (Signed)
Pt has rested quietly throughout the night with no distress noted. Alert and oriented. Pt woke up a couple of times confused however but easily reoriented. On O2 5LHFNC. Wore Bipap for 5 hours tonight before wanting it off. SR on the monitor. PICC to right chest with dressing CDI. Had large BM. No complaints voiced.     Problem: Education: Goal: Ability to describe self-care measures that may prevent or decrease complications (Diabetes Survival Skills Education) will improve Outcome: Progressing Goal: Individualized Educational Video(s) Outcome: Progressing   Problem: Coping: Goal: Ability to adjust to condition or change in health will improve Outcome: Progressing   Problem: Fluid Volume: Goal: Ability to maintain a balanced intake and output will improve Outcome: Progressing   Problem: Health Behavior/Discharge Planning: Goal: Ability to identify and utilize available resources and services will improve Outcome: Progressing Goal: Ability to manage health-related needs will improve Outcome: Progressing   Problem: Metabolic: Goal: Ability to maintain appropriate glucose levels will improve Outcome: Progressing   Problem: Skin Integrity: Goal: Risk for impaired skin integrity will decrease Outcome: Progressing   Problem: Tissue Perfusion: Goal: Adequacy of tissue perfusion will improve Outcome: Progressing   Problem: Education: Goal: Knowledge of General Education information will improve Description: Including pain rating scale, medication(s)/side effects and non-pharmacologic comfort measures Outcome: Progressing   Problem: Health Behavior/Discharge Planning: Goal: Ability to manage health-related needs will improve Outcome: Progressing   Problem: Clinical Measurements: Goal: Ability to maintain clinical measurements within normal limits will improve Outcome: Progressing Goal: Will remain free from infection Outcome: Progressing Goal: Diagnostic test results will  improve Outcome: Progressing Goal: Respiratory complications will improve Outcome: Progressing Goal: Cardiovascular complication will be avoided Outcome: Progressing   Problem: Activity: Goal: Risk for activity intolerance will decrease Outcome: Progressing   Problem: Nutrition: Goal: Adequate nutrition will be maintained Outcome: Progressing   Problem: Coping: Goal: Level of anxiety will decrease Outcome: Progressing   Problem: Pain Management: Goal: General experience of comfort will improve Outcome: Progressing   Problem: Safety: Goal: Ability to remain free from injury will improve Outcome: Progressing

## 2023-08-25 NOTE — Progress Notes (Signed)
[facility] Progress Note  Subjective: seen in room.   Vitals:   08/25/23 0402 08/25/23 0700 08/25/23 0917 08/25/23 0922  BP: (!) 82/36 124/60    Pulse: 67 68  67  Resp: 20 (!) 22  (!) 27  Temp: 98 F (36.7 C) 98.4 F (36.9 C)    TempSrc: Oral Oral    SpO2:  98% 98% 99%  Weight:      Height:        Exam: Gen alert, not in distress, 3 L [place] No jvd or bruits Chest clear bilat to bases RRR no RG Abd soft ntnd no mass or ascites +bs MS no joint effusions or deformity Ext no LE edema Neuro is alert, nf    LUA AVG+bruit           Renal-related home meds: - metoprolol 25 bid - home O2 3 L - eliquis 5 bid - midodrine 15 tid - lyrica 25 qam - renvela 2 ac tid    OP HD: [facility] TTS  4h   400/600   97.5kg  2/2 bath  LUA AVG  Heparin 2000 - last OP HD 11/09, post wt 101.3kg (+4 kg) - coming off 2-4kg over the last 2 wks - hectorol 3 mcg  - mircera 100 mcg IV q 2, last 11/2, due 11/16     Assessment/ Plan: 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 BNP was up at 1345. Has not been getting to dry wt last 2 wks. Had HD 11/12 here w/ 2.5 L UF. Pt decompensated 11/14 and required bipap for poor mental status and severe hypercarbia. CXR 11/14 showed early edema and pt had HD w/ 2.5 L off w/ stable BP's. Post HD the O2 was weaned down to 4 L [place]. Had HD Sat w/ 2.5 L off and [place] O2 down to 3 L [place]. Wears 3 L at home. Volume - repeat CXR 11/14 showed early edema. Extra HD as below monday IV access: very difficult, IR placed a tunneled PICC for IV access.  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 No missed HD. Had HD here 11/12, 11/15 and 11/16. Plan extra HD tomorrow off schedule for volume control.  BP - on midodrine 10 tid for now. 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 Follow.  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 Cont binders.  COPD - on home O2 3 L  Dispo - hopefully will be close to dc after HD Jed Limerick MD  [facility] 08/25/2023, 10:56 AM  Recent Labs  Lab 08/24/23 0426 08/25/23 0310  HGB 7.6* 8.2*  ALBUMIN 2.9* 3.1*  CALCIUM 9.0 9.0  PHOS 4.1 2.9  CREATININE 6.08* 4.24*  K 3.7 3.6   No results for input(s): "IRON", "TIBC", "FERRITIN" in the last 168 hours. Inpatient medications:  allopurinol  100 mg Oral Daily   apixaban  5 mg Oral BID   arformoterol  15 mcg Nebulization BID   budesonide (PULMICORT) nebulizer solution  0.5 mg Nebulization BID   Chlorhexidine Gluconate Cloth  6 each Topical Q0600   Chlorhexidine Gluconate Cloth  6 each Topical Q0600   insulin aspart  0-5 Units Subcutaneous QHS   insulin aspart  0-9 Units Subcutaneous TID WC   leptospermum manuka honey  1 Application Topical Daily   levothyroxine  175 mcg Oral Q0600   metoprolol tartrate  25 mg  Oral BID   midodrine  10 mg Oral Q8H   pantoprazole  40 mg Oral BID AC   revefenacin  175 mcg Nebulization Daily   rosuvastatin  20 mg Oral Daily   sevelamer carbonate  1,600 mg Oral TID WC   sodium chloride flush  10-40 mL Intracatheter Q12H    ampicillin-sulbactam (UNASYN) IV 3 g (08/25/23 0851)    acetaminophen **OR** acetaminophen, albuterol, hydrALAZINE, meclizine

## 2023-08-25 NOTE — Plan of Care (Signed)
  Problem: Fluid Volume: Goal: Ability to maintain a balanced intake and output will improve Outcome: Progressing   Problem: Health Behavior/Discharge Planning: Goal: Ability to manage health-related needs will improve Outcome: Progressing   Problem: Metabolic: Goal: Ability to maintain appropriate glucose levels will improve Outcome: Progressing   Problem: Nutrition: Goal: Adequate nutrition will be maintained Outcome: Progressing

## 2023-08-26 ENCOUNTER — Inpatient Hospital Stay (HOSPITAL_COMMUNITY): Payer: 59

## 2023-08-26 ENCOUNTER — Ambulatory Visit: Payer: 59 | Admitting: Cardiovascular Disease

## 2023-08-26 DIAGNOSIS — J9602 Acute respiratory failure with hypercapnia: Secondary | ICD-10-CM | POA: Diagnosis not present

## 2023-08-26 LAB — GLUCOSE, CAPILLARY
Glucose-Capillary: 108 mg/dL — ABNORMAL HIGH (ref 70–99)
Glucose-Capillary: 121 mg/dL — ABNORMAL HIGH (ref 70–99)
Glucose-Capillary: 179 mg/dL — ABNORMAL HIGH (ref 70–99)
Glucose-Capillary: 222 mg/dL — ABNORMAL HIGH (ref 70–99)

## 2023-08-26 LAB — RENAL FUNCTION PANEL
Albumin: 2.8 g/dL — ABNORMAL LOW (ref 3.5–5.0)
Anion gap: 12 (ref 5–15)
BUN: 48 mg/dL — ABNORMAL HIGH (ref 8–23)
CO2: 27 mmol/L (ref 22–32)
Calcium: 9.2 mg/dL (ref 8.9–10.3)
Chloride: 98 mmol/L (ref 98–111)
Creatinine, Ser: 6.29 mg/dL — ABNORMAL HIGH (ref 0.44–1.00)
GFR, Estimated: 7 mL/min — ABNORMAL LOW (ref >60)
Glucose, Bld: 132 mg/dL — ABNORMAL HIGH (ref 70–99)
Phosphorus: 3.3 mg/dL (ref 2.5–4.6)
Potassium: 3.7 mmol/L (ref 3.5–5.1)
Sodium: 137 mmol/L (ref 135–145)

## 2023-08-26 LAB — CBC
HCT: 27.3 % — ABNORMAL LOW (ref 36.0–46.0)
Hemoglobin: 7.9 g/dL — ABNORMAL LOW (ref 12.0–15.0)
MCH: 31 pg (ref 26.0–34.0)
MCHC: 28.9 g/dL — ABNORMAL LOW (ref 30.0–36.0)
MCV: 107.1 fL — ABNORMAL HIGH (ref 80.0–100.0)
Platelets: 207 10*3/uL (ref 150–400)
RBC: 2.55 MIL/uL — ABNORMAL LOW (ref 3.87–5.11)
RDW: 15.4 % (ref 11.5–15.5)
WBC: 8.4 10*3/uL (ref 4.0–10.5)
nRBC: 0.8 % — ABNORMAL HIGH (ref 0.0–0.2)

## 2023-08-26 MED ORDER — DICLOFENAC SODIUM 1 % EX GEL
2.0000 g | Freq: Four times a day (QID) | CUTANEOUS | Status: DC
  Administered 2023-08-26 – 2023-08-30 (×12): 2 g via TOPICAL
  Filled 2023-08-26: qty 100

## 2023-08-26 MED ORDER — HEPARIN SODIUM (PORCINE) 1000 UNIT/ML IJ SOLN
INTRAMUSCULAR | Status: AC
  Filled 2023-08-26: qty 4

## 2023-08-26 MED ORDER — HYDROXYZINE HCL 10 MG/5ML PO SYRP
10.0000 mg | ORAL_SOLUTION | Freq: Three times a day (TID) | ORAL | Status: DC | PRN
  Administered 2023-08-27: 10 mg via ORAL
  Filled 2023-08-26 (×2): qty 5

## 2023-08-26 NOTE — TOC Initial Note (Signed)
Transition of Care [facility]) - Initial/Assessment Note    Patient Details  Name: [name] [name] MRN: [id number] Date of Birth: 06/06/1951  [facility]) CM/SW Contact:    Erin Sons, LCSW Phone Number: 08/26/2023, 1:42 PM  Clinical Narrative:                  CSW met with pt and pt's son to discuss disposition. Pt lives at home with son. Son does not work and lives with pt. She also has an Aide that comes in daily for 3 hours. Pt does not want to go to a SNF and son agrees. They are interested in [facility]. Pt has all DME she needs aside from BIPAP. CSW notified RNCM.    Expected Discharge Plan: Home w Home Health Services Barriers to Discharge: Continued Medical Work up   Patient Goals and CMS Choice    Wants to return home        Expected Discharge Plan and Services   Discharge Planning Services: CM Consult Post Acute Care Choice: Home Health, Durable Medical Equipment Living arrangements for the past 2 months: Apartment                 DME Arranged: Bipap DME Agency: [employer] Date DME Agency Contacted: 08/23/23 Time DME Agency Contacted: [phone] Representative spoke with at DME Agency: Macon Large HH Arranged: RN, PT, OT [facility] Agency: [facility] Date [facility] Agency Contacted: 08/23/23 Time HH Agency Contacted: [phone] Representative spoke with at [facility] Agency: Kandee Keen  Prior Living Arrangements/Services Living arrangements for the past 2 months: Apartment Lives with:: Adult Children Patient language and need for interpreter reviewed:: Yes        Need for Family Participation in Patient Care: Yes (Comment) Care giver support system in place?: Yes (comment)   Criminal Activity/Legal Involvement Pertinent to Current Situation/Hospitalization: No - Comment as needed  Activities of Daily Living   ADL Screening (condition at time of admission) Independently performs ADLs?: No Does the patient have a NEW difficulty with bathing/dressing/toileting/self-feeding  that is expected to last >3 days?: No Does the patient have a NEW difficulty with getting in/out of bed, walking, or climbing stairs that is expected to last >3 days?: No Does the patient have a NEW difficulty with communication that is expected to last >3 days?: No Is the patient deaf or have difficulty hearing?: No Does the patient have difficulty seeing, even when wearing glasses/contacts?: Yes Does the patient have difficulty concentrating, remembering, or making decisions?: Yes  Permission Sought/Granted                  Emotional Assessment       Orientation: : Oriented to Self, Oriented to Place, Oriented to  Time Alcohol / Substance Use: Not Applicable Psych Involvement: No (comment)  Admission diagnosis:  Acute respiratory failure (HCC) [J96.00] Elevated troponin I level [R79.89] Acute respiratory failure, unspecified whether with hypoxia or hypercapnia (HCC) [J96.00] Acute congestive heart failure, unspecified heart failure type (HCC) [I50.9] Patient Active Problem List   Diagnosis Date Noted   Hypotension 08/22/2023   Acute encephalopathy 08/22/2023   Acute congestive heart failure (HCC) 08/22/2023   Acute on chronic respiratory failure with hypoxia and hypercapnia (HCC) 07/15/2023   Acute metabolic encephalopathy 07/15/2023   Septic shock (HCC) 07/15/2023   Acute respiratory failure with hypercapnia (HCC) 07/11/2023   CAD (coronary artery disease) 07/06/2023   ESRD on dialysis (HCC) 07/05/2023   Acute respiratory failure (HCC) 08/07/2022  Respiratory failure, post-operative (HCC) 08/07/2022   Pulmonary edema 08/07/2022   Acute on chronic respiratory failure with hypercapnia (HCC) 08/07/2022   Lung mass 07/30/2022   Lung cancer, lower lobe (HCC) 01/31/2022   Pressure injury of skin 12/06/2021   Malnutrition of moderate degree 12/01/2021   ESRD (end stage renal disease) on dialysis [facility])    Lung nodules    Advanced care planning/counseling discussion     Goals of care, counseling/discussion    Acute respiratory failure with hypoxia and hypercarbia (HCC)    Labored breathing    Kidney lesion, native, left 11/24/2021   Syncope 11/21/2021   Chronic pain syndrome 07/01/2021   COPD exacerbation (HCC) 07/01/2021   Cellulitis of leg, right 07/01/2021   SARS-CoV-2 positive 02/21/2021   Increased weakness when ambulating 02/17/2021   Intractable nausea and vomiting 02/15/2021   Symptomatic anemia 01/24/2021   Anemia, unspecified 01/08/2020   Bacteremia 01/07/2020   UTI (urinary tract infection) 01/06/2020   Anemia 10/26/2019   S/P lumbar fusion 09/18/2019   Acute on chronic combined systolic and diastolic CHF (congestive heart failure) (HCC) 03/16/2019   Leukocytosis 03/16/2019   Chest pain 11/27/2018   Pre-syncope 11/27/2018   Epigastric abdominal pain 11/27/2018   Solitary pulmonary nodule 11/06/2018   Hypertensive heart and renal disease, stage 1-4 or unspecified chronic kidney disease, with heart failure (HCC) 10/20/2018   CHF (congestive heart failure), NYHA class III, acute on chronic, systolic (HCC) 10/20/2018   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, unspecified extremity (HCC) 04/19/2016   Cellulitis of abdominal wall 03/21/2015   Acute on chronic renal failure (HCC) 03/21/2015   DM (diabetes mellitus), type 2 with renal complications (HCC) 03/21/2015   Abdominal wall abscess 03/21/2015   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   Low urine output 08/11/2014   COPD with asthma (HCC) 08/11/2014   Hypothyroidism 08/11/2014   OSA (obstructive sleep apnea) 08/11/2014   Positive cardiac stress test 06/29/2014   Hyperlipidemia 02/12/2014   Left arm weakness 12/23/2013   TIA (transient ischemic attack) 12/23/2013   Left buttock abscess 10/12/2013   Cellulitis and abscess of leg, right 04/16/2013   Acute blood loss anemia 02/14/2013   HTN (hypertension) 02/14/2013   Cellulitis and abscess of buttock  02/09/2013   Class 2 obesity due to excess calories with body mass index (BMI) of 36.0 to 36.9 in adult 02/03/2013   Diarrhea 02/03/2013   Hyponatremia 02/03/2013   Sepsis due to undetermined organism (HCC) 02/02/2013   Diabetes mellitus without complication (HCC) 02/02/2013   CKD (chronic kidney disease), stage IV (HCC) 02/02/2013   Cellulitis 02/02/2013   PAD (peripheral artery disease) (HCC) 04/26/2011   Tobacco abuse 04/26/2011   PCP:  Vladimir Crofts, FNP Pharmacy:   [facility] [id number] [place], [place] - [address] AT [facility] OF [address] & [address] [place] [address] Phone: [phone] Fax: [phone]  [facility] [address] [place] [address] Phone: [phone] Fax: [phone]     Social Determinants of Health (SDOH) Social History: SDOH Screenings   Food Insecurity: No Food Insecurity (08/20/2023)  Housing: Patient Declined (08/20/2023)  Transportation Needs: No Transportation Needs (08/20/2023)  Utilities: Not At Risk (08/20/2023)  Tobacco Use: Medium Risk (08/20/2023)   SDOH Interventions: Transportation Interventions: Inpatient TOC   Readmission Risk Interventions    08/21/2023    3:23 PM 05/09/2023    3:37 PM 05/08/2023   12:17  PM  Readmission Risk Prevention Plan  Transportation Screening Complete Complete Complete  Medication Review [employer]) Referral to Pharmacy Complete Complete  PCP or Specialist appointment within 3-5 days of discharge Complete Complete Complete  HRI or Home Care Consult Complete Complete Complete  SW Recovery Care/Counseling Consult Complete Complete Complete  Palliative Care Screening Not Applicable Not Applicable Not Applicable  Skilled Nursing Facility Not Applicable Complete Complete

## 2023-08-26 NOTE — Progress Notes (Signed)
   08/26/23 1210  Vitals  Pulse Rate 74  Resp 19  BP 138/64  SpO2 100 %  O2 Device Nasal Cannula  Weight 95.4 kg  Type of Weight Post-Dialysis  Oxygen Therapy  O2 Flow Rate (L/min) 3 L/min  Patient Activity (if Appropriate) In bed  Pulse Oximetry Type Continuous  Post Treatment  Dialyzer Clearance Lightly streaked  Hemodialysis Intake (mL) 0 mL  Liters Processed 72  Fluid Removed (mL) 3000 mL  Tolerated HD Treatment Yes  AVG/AVF Arterial Site Held (minutes) 8 minutes  AVG/AVF Venous Site Held (minutes) 8 minutes   Received patient in bed to unit.  Alert and oriented.  Informed consent signed and in chart.   TX duration:3hrs  Patient tolerated well.  Transported back to the room  Alert, without acute distress.  Hand-off given to patient's nurse.   Access used: LAVG Access issues: none  Total UF removed: 3L Medication(s) given: none    [facility]

## 2023-08-26 NOTE — Discharge Instructions (Signed)

## 2023-08-26 NOTE — Plan of Care (Signed)
Pt has rested quietly throughout the night with no distress noted. Alert and oriented. On Bipap during the night. O2 3LNC until sleep. Medicated for pain twice with relief noted. Medicated for anxiety/sleep with relief noted. Right chest dual lumen PICC with dressing CDI. No respiratory distress noted. No complaints voiced.     Problem: Education: Goal: Ability to describe self-care measures that may prevent or decrease complications (Diabetes Survival Skills Education) will improve Outcome: Progressing Goal: Individualized Educational Video(s) Outcome: Progressing   Problem: Coping: Goal: Ability to adjust to condition or change in health will improve Outcome: Progressing   Problem: Fluid Volume: Goal: Ability to maintain a balanced intake and output will improve Outcome: Progressing   Problem: Health Behavior/Discharge Planning: Goal: Ability to identify and utilize available resources and services will improve Outcome: Progressing Goal: Ability to manage health-related needs will improve Outcome: Progressing   Problem: Metabolic: Goal: Ability to maintain appropriate glucose levels will improve Outcome: Progressing   Problem: Skin Integrity: Goal: Risk for impaired skin integrity will decrease Outcome: Progressing   Problem: Tissue Perfusion: Goal: Adequacy of tissue perfusion will improve Outcome: Progressing   Problem: Education: Goal: Knowledge of General Education information will improve Description: Including pain rating scale, medication(s)/side effects and non-pharmacologic comfort measures Outcome: Progressing   Problem: Health Behavior/Discharge Planning: Goal: Ability to manage health-related needs will improve Outcome: Progressing   Problem: Clinical Measurements: Goal: Ability to maintain clinical measurements within normal limits will improve Outcome: Progressing Goal: Will remain free from infection Outcome: Progressing Goal: Diagnostic test results  will improve Outcome: Progressing Goal: Respiratory complications will improve Outcome: Progressing Goal: Cardiovascular complication will be avoided Outcome: Progressing   Problem: Activity: Goal: Risk for activity intolerance will decrease Outcome: Progressing   Problem: Nutrition: Goal: Adequate nutrition will be maintained Outcome: Progressing   Problem: Coping: Goal: Level of anxiety will decrease Outcome: Progressing   Problem: Pain Management: Goal: General experience of comfort will improve Outcome: Progressing   Problem: Safety: Goal: Ability to remain free from injury will improve Outcome: Progressing

## 2023-08-26 NOTE — Progress Notes (Signed)
   08/26/23 2239  BiPAP/CPAP/SIPAP  $ Non-Invasive Ventilator  Non-Invasive Vent Subsequent  BiPAP/CPAP/SIPAP Pt Type Adult  BiPAP/CPAP/SIPAP SERVO  Mask Type Full face mask  Mask Size Medium  Set Rate 22 breaths/min  Respiratory Rate 25 breaths/min  IPAP 17 cmH20  EPAP 5 cmH2O  FiO2 (%) 40 %  Minute Ventilation 15.5  Leak 33  Peak Inspiratory Pressure (PIP) 19  Tidal Volume (Vt) 421  Patient Home Equipment No  Auto Titrate No  Press High Alarm 25 cmH2O  Press Low Alarm 5 cmH2O  CPAP/SIPAP surface wiped down Yes   Pt asleep comfortably on bipap.  RT will continue to monitor

## 2023-08-26 NOTE — TOC Progression Note (Signed)
Transition of Care [facility]) - Progression Note    Patient Details  Name: [name] [name] MRN: [id number] Date of Birth: 1950-11-11  Transition of Care [facility] CM/SW Contact  Gordy Clement, RN Phone Number: 08/26/2023, 2:19 PM  Clinical Narrative:    RNCM met with patient and son (Caregiver) bedside to discuss their dc plan. Patient and Son want patient to return home.  All equipment is in the home. BiPaP has been arranged thru [employer]- Home Health will be provided by [facility]. TOC will continue to follow patient for any additional discharge needs     Expected Discharge Plan: Home w Home Health Services Barriers to Discharge: Continued Medical Work up  Expected Discharge Plan and Services   Discharge Planning Services: CM Consult Post Acute Care Choice: Home Health, Durable Medical Equipment Living arrangements for the past 2 months: Apartment                 DME Arranged: Bipap DME Agency: [employer] Date DME Agency Contacted: 08/23/23 Time DME Agency Contacted: [phone] Representative spoke with at DME Agency: Macon Large HH Arranged: RN, PT, OT [facility] Agency: [facility] Date [facility] Agency Contacted: 08/23/23 Time HH Agency Contacted: [phone] Representative spoke with at [facility] Agency: Kandee Keen   Social Determinants of Health (SDOH) Interventions SDOH Screenings   Food Insecurity: No Food Insecurity (08/20/2023)  Housing: Patient Declined (08/20/2023)  Transportation Needs: No Transportation Needs (08/20/2023)  Utilities: Not At Risk (08/20/2023)  Tobacco Use: Medium Risk (08/20/2023)    Readmission Risk Interventions    08/21/2023    3:23 PM 05/09/2023    3:37 PM 05/08/2023   12:17 PM  Readmission Risk Prevention Plan  Transportation Screening Complete Complete Complete  Medication Review (RN Care Manager) Referral to Pharmacy Complete Complete  PCP or Specialist appointment within 3-5 days of discharge Complete Complete Complete  HRI or Home Care Consult Complete Complete  Complete  SW Recovery Care/Counseling Consult Complete Complete Complete  Palliative Care Screening Not Applicable Not Applicable Not Applicable  Skilled Nursing Facility Not Applicable Complete Complete

## 2023-08-26 NOTE — Progress Notes (Signed)
Pharmacy Antibiotic Note  [name] [name] is a 72 y.o. female admitted on 08/20/2023 with pneumonia.  Pharmacy has been consulted for unasyn dosing.  D7 total abx. Wbc wnl. Ok to stop abx today per Dr. Thedore Mins  Plan: Stop abx today  Height: 5\' 3"  (160 cm) Weight: 95.4 kg (210 lb 5.1 oz) IBW/kg (Calculated) : 52.4  Temp (24hrs), Avg:98.5 F (36.9 C), Min:98 F (36.7 C), Max:99.1 F (37.3 C)  Recent Labs  Lab 08/22/23 0533 08/23/23 0839 08/24/23 0426 08/25/23 0310 08/26/23 0900  WBC 11.7* 8.9 8.5 8.4 8.4  CREATININE 7.50* 4.94* 6.08* 4.24* 6.29*    Estimated Creatinine Clearance: 8.9 mL/min (A) (by C-G formula based on SCr of 6.29 mg/dL (H)).    Allergies  Allergen Reactions   Nebivolol Swelling    Chest pain (reaction to Bystolic)   Zestril [Lisinopril] Swelling and Other (See Comments)   Ace Inhibitors Swelling and Other (See Comments)    Tongue swell   Morphine And Codeine Itching     Dose adjustments this admission: Unasyn adjusted for iHD   Ulyses Southward, PharmD, BCIDP, AAHIVP, CPP Infectious Disease Pharmacist 08/26/2023 2:04 PM

## 2023-08-26 NOTE — Progress Notes (Signed)
[facility] Progress Note  Subjective: seen in room. 3 L UF this am w/o sig BP drops. Lowest BP 103/ 70.    Vitals:   08/26/23 1130 08/26/23 1200 08/26/23 1210 08/26/23 1300  BP: (!) 113/93 112/63 138/64 133/72  Pulse: 79 81 74 (!) 146  Resp: (!) 22 14 19 16   Temp:   99 F (37.2 C)   TempSrc:      SpO2: 100% 100% 100% (!) 32%  Weight:   95.4 kg   Height:        Exam: Gen alert, not in distress, 3 L [place] No jvd or bruits Chest clear bilat to bases RRR no RG Abd soft ntnd no mass or ascites +bs MS no joint effusions or deformity Ext no LE edema Neuro is alert, nf    LUA AVG+bruit           Renal-related home meds: - metoprolol 25 bid - home O2 3 L - eliquis 5 bid - midodrine 15 tid - lyrica 25 qam - renvela 2 ac tid    OP HD: [facility] TTS  4h   400/600   97.5kg  2/2 bath  LUA AVG  Heparin 2000 - last OP HD 11/09, post wt 101.3kg (+4 kg) - coming off 2-4kg over the last 2 wks - hectorol 3 mcg  - mircera 100 mcg IV q 2, last 11/2, due 11/16     Assessment/ Plan: 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 BNP was up at 1345. Has not been getting to dry wt last 2 wks. Had HD 11/12 here w/ 2.5 L UF. Pt decompensated 11/14 and required bipap for poor mental status and severe hypercarbia. CXR 11/14 showed early edema and pt had HD w/ 2.5 L off w/ stable BP's. Post HD the O2 was weaned down to 4 L [place]. Since then has weaned down to 3 L which is what she wears at home. Volume - repeat CXR w/ edema 11/14.  3L off today, 2kg under post HD.  IV access: very difficult, IR placed a tunneled PICC for IV access.  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 No missed HD. Had HD today w/ 3 L off off schedule. No HD tomorrow. Next HD 11/21.  BP - on midodrine 10 tid for now. 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 Follow.  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 Cont binders.  COPD - on home O2 3 L [place] Dispo - ok for dc from renal  standpoint    [name] MD  [facility] 08/26/2023, 4:15 PM  Recent Labs  Lab 08/25/23 0310 08/26/23 0900  HGB 8.2* 7.9*  ALBUMIN 3.1* 2.8*  CALCIUM 9.0 9.2  PHOS 2.9 3.3  CREATININE 4.24* 6.29*  K 3.6 3.7   No results for input(s): "IRON", "TIBC", "FERRITIN" in the last 168 hours. Inpatient medications:  allopurinol  100 mg Oral Daily   apixaban  5 mg Oral BID   arformoterol  15 mcg Nebulization BID   budesonide (PULMICORT) nebulizer solution  0.5 mg Nebulization BID   Chlorhexidine Gluconate Cloth  6 each Topical Q0600   Chlorhexidine Gluconate Cloth  6 each Topical Q0600   heparin sodium (porcine)       insulin aspart  0-5 Units Subcutaneous QHS   insulin aspart  0-9 Units Subcutaneous TID WC   leptospermum manuka honey  1 Application Topical  Daily   levothyroxine  175 mcg Oral Q0600   metoprolol tartrate  25 mg Oral BID   midodrine  10 mg Oral Q8H   pantoprazole  40 mg Oral BID AC   revefenacin  175 mcg Nebulization Daily   rosuvastatin  20 mg Oral Daily   sevelamer carbonate  1,600 mg Oral TID WC   sodium chloride flush  10-40 mL Intracatheter Q12H    ampicillin-sulbactam (UNASYN) IV 3 g (08/26/23 1312)    acetaminophen **OR** acetaminophen, albuterol, heparin sodium (porcine), hydrALAZINE, hydrOXYzine, meclizine

## 2023-08-26 NOTE — Progress Notes (Signed)
Physical Therapy Treatment Patient Details Name: [name] [name] MRN: [id number] DOB: 27-Apr-1951 Today's Date: 08/26/2023   History of Present Illness The pt is a 72 yo female presenting 08/20/23 with respiratory failure due to PNA.  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    PT Comments  Pt received in supine and agreeable to session. Pt reporting need for BM and agreeable to transfer to [facility]. Pt able to sit to EOB with mod A and increased time. Pt requires max A +2 to stand to the stedy, however is unable to reach a full upright posture due to BLE weakness. Pt reports dizziness while sitting on the stedy paddles that does not improve with extended rest break, so deferred transfer to [facility] and returned to bed. Pt requires +3 assist to stand enough to move stedy paddles and max A +2 to return to supine due to dizziness and increased fatigue. During second standing attempt, pt reports L ankle pain during WB. Pt incontinent of bowel during transfer back to bed and was left on bedpan, RN notified. Pt continues to benefit from PT services to progress toward functional mobility goals.     If plan is discharge home, recommend the following: A lot of help with bathing/dressing/bathroom;Assistance with cooking/housework;Assist for transportation;Two people to help with walking and/or transfers   Can travel by private vehicle     No  Equipment Recommendations  None recommended by PT    Recommendations for Other Services       Precautions / Restrictions Precautions Precautions: Fall Precaution Comments: watch sats and bp Restrictions Weight Bearing Restrictions: No     Mobility  Bed Mobility Overal bed mobility: Needs Assistance Bed Mobility:  Supine to Sit, Sit to Supine     Supine to sit: Mod assist Sit to supine: Max assist, +2 for physical assistance   General bed mobility comments: Pt able to advance BLE to EOB and pull on rail with mod A for trunk elevation and scooting forward at EOB with bedpad    Transfers Overall transfer level: Needs assistance Equipment used: Ambulation equipment used Transfers: Sit to/from Stand Sit to Stand: Max assist, +2 physical assistance           General transfer comment: max A +2 for power up from elevated EOB and stedy paddles with pt unable to reach a full upright posture due to BLE pain. Unable to trasfer to [facility] due to dizziness Transfer via Lift Equipment: Stedy     Balance Overall balance assessment: Needs assistance Sitting-balance support: Bilateral upper extremity supported, Feet supported Sitting balance-Leahy Scale: Fair Sitting balance - Comments: sitting EOB   Standing balance support: Bilateral upper extremity supported, During functional activity Standing balance-Leahy Scale: Zero Standing balance comment: requires max A +2 to squat, but unable to reach full upright posture                            Cognition Arousal: Alert Behavior During Therapy: Flat affect Overall Cognitive Status: No family/caregiver present to determine baseline cognitive functioning  Exercises      General Comments General comments (skin integrity, edema, etc.): Pt became dizzy once sitting on stedy paddles and requires return to supine. Pt's son reports this occurred at home. Unable to obtain BP due to pt moving UE despite cues      Pertinent Vitals/Pain Pain Assessment Pain Assessment: Faces Faces Pain Scale: Hurts even more Pain Location: L ankle with WB Pain Descriptors / Indicators: Aching, Guarding, Grimacing Pain Intervention(s): Monitored during session, Repositioned, Limited activity within patient's  tolerance     PT Goals (current goals can now be found in the care plan section) Acute Rehab PT Goals Patient Stated Goal: be able to stand PT Goal Formulation: With patient Time For Goal Achievement: 09/07/23 Progress towards PT goals: Progressing toward goals    Frequency    Min 1X/week       AM-PAC PT "6 Clicks" Mobility   Outcome Measure  Help needed turning from your back to your side while in a flat bed without using bedrails?: A Little Help needed moving from lying on your back to sitting on the side of a flat bed without using bedrails?: A Lot Help needed moving to and from a bed to a chair (including a wheelchair)?: Total Help needed standing up from a chair using your arms (e.g., wheelchair or bedside chair)?: Total Help needed to walk in hospital room?: Total Help needed climbing 3-5 steps with a railing? : Total 6 Click Score: 9    End of Session Equipment Utilized During Treatment: Oxygen;Gait belt Activity Tolerance: Patient limited by fatigue;Patient limited by pain;Other (comment) (dizziness) Patient left: with call bell/phone within reach;in bed;with family/visitor present Nurse Communication: Mobility status PT Visit Diagnosis: Other abnormalities of gait and mobility (R26.89);Muscle weakness (generalized) (M62.81)     Time: 1610-9604 PT Time Calculation (min) (ACUTE ONLY): 38 min  Charges:    $Therapeutic Activity: 38-52 mins PT General Charges $$ ACUTE PT VISIT: 1 Visit                     Johny Shock, PTA Acute Rehabilitation Services Secure Chat Preferred  [phone]    Johny Shock 08/26/2023, 3:09 PM

## 2023-08-26 NOTE — Progress Notes (Signed)
PROGRESS NOTE        PATIENT DETAILS Name: [name] [name] Age: 72 y.o. Sex: female Date of Birth: February 03, 1951 Admit Date: 08/20/2023 Admitting Physician Lorin Glass, MD GUY:QIHK, [name], FNP  Brief Summary: Patient is a 72 y.o.  female with history of ESRD on HD TTS, HFrEF, COPD on home O2 3 L/min, nonobstructive CAD on recent [facility] 9/27, PAF on Eliquis, small cell cancer of lung-s/p radiation (completed April 06, 2022), OSA-nontolerant to CPAP, HTN, DM-2-who presented to the hospital on 11/12 with shortness of breath-she was found to have PNA-and subsequently admitted to the hospitalist service.  Hospital course subsequently complicated by hypercarbia requiring initiation of BiPAP and transfer to progressive care unit.    Significant events: 10/3-10/12-hospitalization for volume overload/COPD exacerbation/acute metabolic encephalopathy 11/12>> admit to [facility]-PNA 11/14>> somnolent-found to have hypercarbia-BiPAP-transfer to PCU.  Significant studies: 11/12>> CXR: Possible retrocardiac infiltrate-possible left lower lobe consolidation 11/14>> CXR: Worsening pulm edema 11/15>> CXR improved pulmonary edema.  Stable dense left lower lobe airspace disease.  Significant microbiology data: 11/12>> COVID/influenza/RSV PCR: Negative  Procedures: 11/14>> right-sided TDC catheter by IR  Consults: PCCM Nephrology  Subjective:   Patient in bed, appears comfortable, denies any headache, no fever, no chest pain or pressure, no shortness of breath , no abdominal pain. No new focal weakness.   Objective: Vitals: Blood pressure (!) 112/41, pulse 62, temperature 98.5 F (36.9 C), temperature source Oral, resp. rate (!) 22, height 5\' 3"  (1.6 m), weight 99 kg, SpO2 99%.   Exam:  Awake Alert, No new F.N deficits, Normal affect [place].AT,PERRAL Supple Neck, No JVD,   Symmetrical Chest wall movement, Good air movement bilaterally, CTAB RRR,No Gallops, Rubs or new Murmurs,   +ve B.Sounds, Abd Soft, No tenderness,   No Cyanosis, Clubbing or edema    Assessment/Plan:\  Acute on chronic hypercarbic/hypoxic respiratory failure Multifactorial etiology-primarily due to acute pulm edema due to HFrEF exacerbation-with contributions from underlying PNA/COPD/OSA (previously noncompliant to CPAP) Much better after BiPAP and hemodialysis 11/15 Awaiting nighttime BiPAP now, home BiPAP being arranged, daytime now at baseline 3 L nasal cannula oxygen.   Discussed with case management 11/15-arrangements are ongoing to arrange for home BiPAP.  SpO2: 99 % O2 Flow Rate (L/min): 3 L/min FiO2 (%): 40 %  Acute metabolic encephalopathy Secondary to hypercarbia Improved with BiPAP and supportive care, now close to baseline    LLL PNA Continue IV Unasyn for a few more days before transitioning back to oral Augmentin. . D/w SLP on 11/15-suspected esophageal origin for dysphagia-2/2 reflux-on PPI-now that her respiratory status has improved-will see if we can get a barium esophagogram-and if she remains stable-possibly will need a GI evaluation over the next several days.  COPD Continue scheduled bronchodilators No obvious wheezing-not in exacerbation but suspect still playing a role in hypoxia/hypercarbia.  History of OSA Previously noncompliant to BiPAP/CPAP However has been counseled extensively-tolerated BiPAP 5 hours last night. Continue BiPAP nightly-and as needed during the daytime when sleeping.  Discussed with case management 11/15-arrangements are ongoing to arrange for home BiPAP.  Acute on chronic HFrEF(EF 35-40% by TTE on 04/15/2023) Volume removal with hemodialysis Continue beta-blocker-is on midodrine as well.  PAF Telemetry monitoring Continue beta-blocker Continue Eliquis Reviewed most recent cardiology note-thought not to be a candidate for long-term amiodarone-does not appear to be on amiodarone any longer.   Nonobstructive CAD on  recent [facility] on  9/27 No chest pain Should not be on Plavix any longer as patient is now on Eliquis-Plavix discontinued 11/15 Continue beta-blocker/statin.     History of PAD Statin See above regarding Plavix.   ESRD on HD TTS Nephrology following and directing HD care   Normocytic anemia Secondary to combination of ESRD/critical illness Aranesp/iron deferred to nephrology service Follow CBC  Hyponatremia Mild Improved with volume removal with HD.   Hypothyroidism Synthroid TSH 10/3 stable   Gout Not in exacerbation Allopurinol  History of lung cancer-s/p radiation Recently seen by Dr. Fay Records PET scan planned.  Per prior imaging-some concern for recurrence of underlying cancer.   Osteoarthritis of bilateral knees Stable at present-outpatient orthopedic evaluation   Debility/deconditioning Per patient-she has not ambulated in close to a month-previously minimally ambulatory and mostly bed to wheelchair bound PT/OT eval when more stable.   Nutrition Status: Nutrition Problem: Increased nutrient needs Etiology: chronic illness (ESRD on HD) Signs/Symptoms: estimated needs Interventions: Refer to RD note for recommendations  DM-2 (A1c 7.1 on 7/6) CBG stable-continue SSI  Recent Labs    08/25/23 1529 08/25/23 2054 08/26/23 0814  GLUCAP 158* 181* 108*      Obesity:  Estimated body mass index is 38.66 kg/m follow with PCP for weight loss.  The patient is critically ill with multiple organ system failure and requires high complexity decision making for assessment and support, frequent evaluation and titration of therapies, advanced monitoring, review of radiographic studies and interpretation of complex data.   Code status:   Code Status: Limited: Do not attempt resuscitation (DNR) -DNR-LIMITED -Do Not Intubate/DNI    DVT Prophylaxis: apixaban (ELIQUIS) tablet 5 mg    Family Communication: Son at bedside 11/15.   Disposition Plan: Status is:  Inpatient Remains inpatient appropriate because: Severity of illness   Planned Discharge Destination:Home   Diet: Diet Order             Diet renal/carb modified with fluid restriction Diet-HS Snack? Nothing; Fluid restriction: 1200 mL Fluid; Room service appropriate? Yes; Fluid consistency: Thin  Diet effective now                   MEDICATIONS: Scheduled Meds:  allopurinol  100 mg Oral Daily   apixaban  5 mg Oral BID   arformoterol  15 mcg Nebulization BID   budesonide (PULMICORT) nebulizer solution  0.5 mg Nebulization BID   Chlorhexidine Gluconate Cloth  6 each Topical Q0600   Chlorhexidine Gluconate Cloth  6 each Topical Q0600   heparin sodium (porcine)       insulin aspart  0-5 Units Subcutaneous QHS   insulin aspart  0-9 Units Subcutaneous TID WC   leptospermum manuka honey  1 Application Topical Daily   levothyroxine  175 mcg Oral Q0600   metoprolol tartrate  25 mg Oral BID   midodrine  10 mg Oral Q8H   pantoprazole  40 mg Oral BID AC   revefenacin  175 mcg Nebulization Daily   rosuvastatin  20 mg Oral Daily   sevelamer carbonate  1,600 mg Oral TID WC   sodium chloride flush  10-40 mL Intracatheter Q12H   Continuous Infusions:  ampicillin-sulbactam (UNASYN) IV 3 g (08/25/23 2137)   PRN Meds:.acetaminophen **OR** acetaminophen, albuterol, heparin sodium (porcine), hydrALAZINE, hydrOXYzine, meclizine   I have personally reviewed following labs and imaging studies  LABORATORY DATA:  Recent Labs  Lab 08/20/23 0456 08/20/23 5621 08/21/23 0402 08/22/23 0533 08/23/23 3086 08/24/23 0426 08/25/23 0310  WBC 7.1  --  6.4 11.7* 8.9 8.5 8.4  HGB 9.1*   < > 10.2* 9.4* 8.7* 7.6* 8.2*  HCT 32.8*   < > 35.4* 34.2* 29.3* 25.7* 28.0*  PLT 187  --  204 235 176 168 197  MCV 109.3*  --  107.9* 112.5* 107.7* 106.6* 106.5*  MCH 30.3  --  31.1 30.9 32.0 31.5 31.2  MCHC 27.7*  --  28.8* 27.5* 29.7* 29.6* 29.3*  RDW 16.0*  --  15.9* 15.4 15.5 15.4 15.5  LYMPHSABS 0.8   --   --  0.7 0.4*  --   --   MONOABS 0.4  --   --  0.7 0.6  --   --   EOSABS 0.0  --   --  0.0 0.0  --   --   BASOSABS 0.0  --   --  0.0 0.0  --   --    < > = values in this interval not displayed.    Recent Labs  Lab 08/20/23 0456 08/20/23 0651 08/20/23 1146 08/20/23 1147 08/21/23 0402 08/22/23 0533 08/23/23 0839 08/24/23 0426 08/25/23 0310  NA  --    < >  --    < > 134* 129* 135 134* 135  K  --    < >  --    < > 4.7 5.0 3.6 3.7 3.6  CL  --    < >  --    < > 94* 88* 93* 94* 96*  CO2  --   --   --    < > 26 28 29 29 29   ANIONGAP  --   --   --    < > 14 13 13 11 10   GLUCOSE  --    < >  --    < > 128* 224* 132* 126* 153*  BUN  --    < >  --    < > 36* 57* 36* 49* 29*  CREATININE  --    < >  --    < > 5.84* 7.50* 4.94* 6.08* 4.24*  ALBUMIN  --   --   --   --   --   --  3.2* 2.9* 3.1*  PROCALCITON  --   --  0.39  --   --   --   --   --   --   BNP 1,345.3*  --   --   --   --   --   --   --   --   CALCIUM  --   --   --    < > 9.4 9.3 9.0 9.0 9.0   < > = values in this interval not displayed.     Recent Labs  Lab 08/20/23 0456 08/20/23 1146 08/20/23 1147 08/21/23 0402 08/22/23 0533 08/23/23 0839 08/24/23 0426 08/25/23 0310  PROCALCITON  --  0.39  --   --   --   --   --   --   BNP 1,345.3*  --   --   --   --   --   --   --   CALCIUM  --   --    < > 9.4 9.3 9.0 9.0 9.0   < > = values in this interval not displayed.      RADIOLOGY STUDIES/RESULTS: No results found.   LOS: 6 days   Signature  -    Susa Raring M.D on 08/26/2023 at 8:35 AM   -  To page go to [email or web address]

## 2023-08-27 DIAGNOSIS — J9602 Acute respiratory failure with hypercapnia: Secondary | ICD-10-CM | POA: Diagnosis not present

## 2023-08-27 LAB — GLUCOSE, CAPILLARY
Glucose-Capillary: 130 mg/dL — ABNORMAL HIGH (ref 70–99)
Glucose-Capillary: 144 mg/dL — ABNORMAL HIGH (ref 70–99)
Glucose-Capillary: 191 mg/dL — ABNORMAL HIGH (ref 70–99)
Glucose-Capillary: 246 mg/dL — ABNORMAL HIGH (ref 70–99)
Glucose-Capillary: 303 mg/dL — ABNORMAL HIGH (ref 70–99)

## 2023-08-27 MED ORDER — COLCHICINE 0.3 MG HALF TABLET
0.3000 mg | ORAL_TABLET | Freq: Two times a day (BID) | ORAL | Status: DC
  Administered 2023-08-27 – 2023-08-28 (×3): 0.3 mg via ORAL
  Filled 2023-08-27 (×4): qty 1

## 2023-08-27 MED ORDER — HEPARIN (PORCINE) 25000 UT/250ML-% IV SOLN
1150.0000 [IU]/h | INTRAVENOUS | Status: AC
  Administered 2023-08-27: 1150 [IU]/h via INTRAVENOUS
  Filled 2023-08-27: qty 250

## 2023-08-27 MED ORDER — INSULIN GLARGINE-YFGN 100 UNIT/ML ~~LOC~~ SOLN
7.0000 [IU] | Freq: Once | SUBCUTANEOUS | Status: AC
  Administered 2023-08-27: 7 [IU] via SUBCUTANEOUS
  Filled 2023-08-27: qty 0.07

## 2023-08-27 MED ORDER — METHYLPREDNISOLONE SODIUM SUCC 40 MG IJ SOLR
40.0000 mg | Freq: Once | INTRAMUSCULAR | Status: AC
  Administered 2023-08-27: 40 mg via INTRAVENOUS
  Filled 2023-08-27: qty 1

## 2023-08-27 NOTE — Progress Notes (Signed)
Occupational Therapy Treatment Patient Details Name: [name] [name] MRN: [id number] DOB: [birth year] Today's Date: 08/27/2023   History of present illness The pt is a 72 yo female presenting 08/20/23 with respiratory failure due to PNA.  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   OT comments  This 72 yo female admitted with above seen today with plan to have her sit up on EOB, but she says she cannot due to her feet hurting from gout. She did agree to to UE arm exercises with theraband. She will continue to benefit from acute OT with follow up now from [facility] since pt and son want home instead of SNF.      If plan is discharge home, recommend the following:  Two people to help with walking and/or transfers;A lot of help with bathing/dressing/bathroom;Assistance with cooking/housework;Help with stairs or ramp for entrance;Assist for transportation   Equipment Recommendations  None recommended by OT       Precautions / Restrictions Precautions Precautions: Fall Precaution Comments: watch sats and bp; gout in feet currently       Mobility Bed Mobility Overal bed mobility: Needs Assistance Bed Mobility: Rolling Rolling: Used rails, Mod assist (rolling to left assisted with bed pad to get all the way over)                     ADL either performed or assessed with clinical judgement    Extremity/Trunk Assessment Upper Extremity Assessment Upper Extremity Assessment: Right hand dominant;RUE deficits/detail;LUE deficits/detail RUE Deficits / Details: chronic R shoulder impairment, shoulder AROM limited to ~55*  elbow flexors/ext 4/5 RUE Coordination: decreased gross motor LUE Deficits / Details: Shoulder flexion 3+/5 MMT, elbow  flexors/ext 4/5, LUE Coordination: decreased gross motor            Vision Patient Visual Report: No change from baseline            Cognition Arousal: Alert Behavior During Therapy: WFL for tasks assessed/performed Overall Cognitive Status: No family/caregiver present to determine baseline cognitive functioning Area of Impairment: Following commands, Problem solving                       Following Commands: Follows one step commands with increased time, Follows multi-step commands with increased time     Problem Solving: Slow processing, Difficulty sequencing, Requires verbal cues, Requires tactile cues          Exercises Other Exercises Other Exercises: Pt did 5 reps of each supine in bed with HOB level 2 theraband tied to bed rails; holding for 5 counts and increased time: shoulder flexion, shoulder horizontal add/abduction, elbow flexion/extension            Pertinent Vitals/ Pain       Pain Assessment Pain Assessment: Faces Faces Pain Scale: Hurts even more Pain Location: feet when she moved them herself in bed Pain Descriptors / Indicators: Jabbing, Tender, Sore Pain Intervention(s): Limited activity within patient's tolerance, Monitored during session (she repositioned them)         Frequency  Min 1X/week        Progress Toward Goals  OT Goals(current goals can now be found in the care plan section)  Progress  towards OT goals: Progressing toward goals (slowly)  Acute Rehab OT Goals Patient Stated Goal: to go home tomorrow OT Goal Formulation: With patient Time For Goal Achievement: 09/07/23 Potential to Achieve Goals: Fair  Plan         AM-PAC OT "6 Clicks" Daily Activity     Outcome Measure   Help from another person eating meals?: None Help from another person taking care of personal grooming?: A Little Help from another person toileting, which includes using toliet, bedpan, or urinal?: Total Help from another person bathing  (including washing, rinsing, drying)?: A Lot Help from another person to put on and taking off regular upper body clothing?: Total Help from another person to put on and taking off regular lower body clothing?: Total 6 Click Score: 12    End of Session    OT Visit Diagnosis: Muscle weakness (generalized) (M62.81);Pain Pain - Right/Left:  (both) Pain - part of body: Ankle and joints of foot   Activity Tolerance Patient tolerated treatment well   Patient Left in bed;with call bell/phone within reach;with bed alarm set           Time: 7846-9629 OT Time Calculation (min): 33 min  Charges: OT General Charges $OT Visit: 1 Visit OT Treatments $Therapeutic Exercise: 23-37 mins  [name] [name] OT Acute Rehabilitation Services Office [phone]    [name] [name] 08/27/2023, 4:15 PM

## 2023-08-27 NOTE — Progress Notes (Signed)
PROGRESS NOTE        PATIENT DETAILS Name: [name] [name] Age: 72 y.o. Sex: female Date of Birth: 1951/05/07 Admit Date: 08/20/2023 Admitting Physician Lorin Glass, MD [id number], [name], FNP  Brief Summary: Patient is a 72 y.o.  female with history of ESRD on HD TTS, HFrEF, COPD on home O2 3 L/min, nonobstructive CAD on recent [facility] 9/27, PAF on Eliquis, small cell cancer of lung-s/p radiation (completed April 06, 2022), OSA-nontolerant to CPAP, HTN, DM-2-who presented to the hospital on 11/12 with shortness of breath-she was found to have PNA-and subsequently admitted to the hospitalist service.  Hospital course subsequently complicated by hypercarbia requiring initiation of BiPAP and transfer to progressive care unit.    Significant events: 10/3-10/12-hospitalization for volume overload/COPD exacerbation/acute metabolic encephalopathy 11/12>> admit to [facility]-PNA 11/14>> somnolent-found to have hypercarbia-BiPAP-transfer to PCU.  Significant studies: 11/12>> CXR: Possible retrocardiac infiltrate-possible left lower lobe consolidation 11/14>> CXR: Worsening pulm edema 11/15>> CXR improved pulmonary edema.  Stable dense left lower lobe airspace disease. 11/18.>> Barium esophagogram shows high-grade stricture and some dysmotility   Significant microbiology data: 11/12>> COVID/influenza/RSV PCR: Negative  Procedures: 11/14>> right-sided TDC catheter by IR  Consults: PCCM Nephrology GI  Subjective:   Patient in bed, appears comfortable, denies any headache, no fever, no chest pain or pressure, no shortness of breath , no abdominal pain. No new focal weakness.  Having some left ankle pain for the last day.   Objective: Vitals: Blood pressure 127/74, pulse 67, temperature 99.2 F (37.3 C), temperature source Oral, resp. rate 20, height 5\' 3"  (1.6 m), weight 95.4 kg, SpO2 97%.   Exam:  Awake Alert, No new F.N deficits, Normal  affect [place].AT,PERRAL Supple Neck, No JVD,   Symmetrical Chest wall movement, Good air movement bilaterally, CTAB RRR,No Gallops, Rubs or new Murmurs,  +ve B.Sounds, Abd Soft, No tenderness,   Left ankle tender to palpate, minimal soft tissue erythema   Assessment/Plan:\  Acute on chronic hypercarbic/hypoxic respiratory failure Multifactorial etiology-primarily due to acute pulm edema due to HFrEF exacerbation-with contributions from underlying PNA/COPD/OSA (previously noncompliant to CPAP) Much better after BiPAP and hemodialysis 11/15 Awaiting nighttime BiPAP now, home BiPAP being arranged, daytime now at baseline 3 L nasal cannula oxygen.   Discussed with case management 11/15-arrangements are ongoing to arrange for home BiPAP.  SpO2: 97 % O2 Flow Rate (L/min): 3 L/min FiO2 (%): 40 %  Acute metabolic encephalopathy Secondary to hypercarbia Improved with BiPAP and supportive care, now close to baseline    LLL PNA Continue IV Unasyn for a few more days before transitioning back to oral Augmentin. . Barium esophagogram shows high-grade stricture and some dysmotility, will involve GI, switch Eliquis to heparin on 08/27/2023.  COPD Continue scheduled bronchodilators No obvious wheezing-not in exacerbation but suspect still playing a role in hypoxia/hypercarbia.  History of OSA Previously noncompliant to BiPAP/CPAP However has been counseled extensively-tolerated BiPAP 5 hours last night. Continue BiPAP nightly-and as needed during the daytime when sleeping.  Discussed with case management 11/15-arrangements are ongoing to arrange for home BiPAP.  Acute on chronic HFrEF(EF 35-40% by TTE on 04/15/2023) Volume removal with hemodialysis Continue beta-blocker-is on midodrine as well.  PAF Telemetry monitoring Continue beta-blocker Continue Eliquis/Heparin Reviewed most recent cardiology note-thought not to be a candidate for long-term amiodarone-does not appear to be on  amiodarone any longer.   Nonobstructive  CAD on recent [facility] on 9/27 No chest pain Should not be on Plavix any longer as patient is now on Eliquis-Plavix discontinued 11/15 Continue beta-blocker/statin.     History of PAD Statin See above regarding Plavix.   ESRD on HD TTS Nephrology following and directing HD care   Left ankle acute gout.  Single dose steroid, colchicine, x-ray stable.  Monitor with supportive care.    Normocytic anemia Secondary to combination of ESRD/critical illness Aranesp/iron deferred to nephrology service Follow CBC  Hyponatremia Mild Improved with volume removal with HD.   Hypothyroidism Synthroid TSH 10/3 stable   Gout Not in exacerbation Allopurinol  History of lung cancer-s/p radiation Recently seen by Dr. Fay Records PET scan planned.  Per prior imaging-some concern for recurrence of underlying cancer.   Osteoarthritis of bilateral knees Stable at present-outpatient orthopedic evaluation   Debility/deconditioning Per patient-she has not ambulated in close to a month-previously minimally ambulatory and mostly bed to wheelchair bound PT/OT eval when more stable.   Nutrition Status: Nutrition Problem: Increased nutrient needs Etiology: chronic illness (ESRD on HD) Signs/Symptoms: estimated needs Interventions: Refer to RD note for recommendations  DM-2 (A1c 7.1 on 7/6) CBG stable-continue SSI, single dose Semglee on 08/27/2023 to cover for steroid she received for gout.  Recent Labs    08/26/23 2017 08/27/23 0012 08/27/23 0833  GLUCAP 222* 144* 130*      Obesity:  Estimated body mass index is 38.66 kg/m follow with PCP for weight loss.  The patient is critically ill with multiple organ system failure and requires high complexity decision making for assessment and support, frequent evaluation and titration of therapies, advanced monitoring, review of radiographic studies and interpretation of complex data.   Code  status:   Code Status: Limited: Do not attempt resuscitation (DNR) -DNR-LIMITED -Do Not Intubate/DNI    DVT Prophylaxis: apixaban (ELIQUIS) tablet 5 mg  >> Hep gtt  Family Communication: Son at bedside 11/15.   Disposition Plan: Status is: Inpatient Remains inpatient appropriate because: Severity of illness   Planned Discharge Destination:Home   Diet: Diet Order             Diet renal/carb modified with fluid restriction Diet-HS Snack? Nothing; Fluid restriction: 1200 mL Fluid; Room service appropriate? Yes; Fluid consistency: Thin  Diet effective now                   MEDICATIONS: Scheduled Meds:  allopurinol  100 mg Oral Daily   apixaban  5 mg Oral BID   arformoterol  15 mcg Nebulization BID   budesonide (PULMICORT) nebulizer solution  0.5 mg Nebulization BID   Chlorhexidine Gluconate Cloth  6 each Topical Q0600   Chlorhexidine Gluconate Cloth  6 each Topical Q0600   colchicine  0.3 mg Oral BID   diclofenac Sodium  2 g Topical QID   insulin aspart  0-5 Units Subcutaneous QHS   insulin aspart  0-9 Units Subcutaneous TID WC   insulin glargine-yfgn  7 Units Subcutaneous Once   leptospermum manuka honey  1 Application Topical Daily   levothyroxine  175 mcg Oral Q0600   methylPREDNISolone (SOLU-MEDROL) injection  40 mg Intravenous Once   metoprolol tartrate  25 mg Oral BID   midodrine  10 mg Oral Q8H   pantoprazole  40 mg Oral BID AC   revefenacin  175 mcg Nebulization Daily   rosuvastatin  20 mg Oral Daily   sevelamer carbonate  1,600 mg Oral TID WC   sodium chloride flush  10-40  mL Intracatheter Q12H   Continuous Infusions:   PRN Meds:.acetaminophen **OR** acetaminophen, albuterol, hydrALAZINE, hydrOXYzine, meclizine   I have personally reviewed following labs and imaging studies  LABORATORY DATA:  Recent Labs  Lab 08/22/23 0533 08/23/23 0839 08/24/23 0426 08/25/23 0310 08/26/23 0900  WBC 11.7* 8.9 8.5 8.4 8.4  HGB 9.4* 8.7* 7.6* 8.2* 7.9*  HCT  34.2* 29.3* 25.7* 28.0* 27.3*  PLT 235 176 168 197 207  MCV 112.5* 107.7* 106.6* 106.5* 107.1*  MCH 30.9 32.0 31.5 31.2 31.0  MCHC 27.5* 29.7* 29.6* 29.3* 28.9*  RDW 15.4 15.5 15.4 15.5 15.4  LYMPHSABS 0.7 0.4*  --   --   --   MONOABS 0.7 0.6  --   --   --   EOSABS 0.0 0.0  --   --   --   BASOSABS 0.0 0.0  --   --   --     Recent Labs  Lab 08/20/23 1146 08/20/23 1147 08/22/23 0533 08/23/23 0839 08/24/23 0426 08/25/23 0310 08/26/23 0900  NA  --    < > 129* 135 134* 135 137  K  --    < > 5.0 3.6 3.7 3.6 3.7  CL  --    < > 88* 93* 94* 96* 98  CO2  --    < > 28 29 29 29 27   ANIONGAP  --    < > 13 13 11 10 12   GLUCOSE  --    < > 224* 132* 126* 153* 132*  BUN  --    < > 57* 36* 49* 29* 48*  CREATININE  --    < > 7.50* 4.94* 6.08* 4.24* 6.29*  ALBUMIN  --   --   --  3.2* 2.9* 3.1* 2.8*  PROCALCITON 0.39  --   --   --   --   --   --   CALCIUM  --    < > 9.3 9.0 9.0 9.0 9.2   < > = values in this interval not displayed.     Recent Labs  Lab 08/20/23 1146 08/20/23 1147 08/22/23 0533 08/23/23 0839 08/24/23 0426 08/25/23 0310 08/26/23 0900  PROCALCITON 0.39  --   --   --   --   --   --   CALCIUM  --    < > 9.3 9.0 9.0 9.0 9.2   < > = values in this interval not displayed.      RADIOLOGY STUDIES/RESULTS: DG Ankle Complete Left  Result Date: 08/26/2023 CLINICAL DATA:  Pain in anterior ankle EXAM: LEFT ANKLE COMPLETE - 3+ VIEW COMPARISON:  04/12/2021 FINDINGS: Frontal, oblique, lateral views of the left ankle are obtained. The bones are severely osteopenic. No acute fracture, subluxation, or dislocation. Osteoarthritis of the ankle and hindfoot. Mild diffuse subcutaneous edema. Diffuse vascular calcifications. IMPRESSION: 1. Osteopenia.  No acute fracture. 2. Osteoarthritis of the ankle and hindfoot. 3. Diffuse subcutaneous edema. Electronically Signed   By: Sharlet Salina M.D.   On: 08/26/2023 21:37   DG ESOPHAGUS W SINGLE CM (SOL OR THIN BA)  Result Date:  08/26/2023 CLINICAL DATA:  Dysphagia with concern for stricture. EXAM: ESOPHAGUS/BARIUM SWALLOW/TABLET STUDY TECHNIQUE: Single contrast examination was performed using thin liquid barium. This exam was performed by Corrin Parker, PA-C, and was supervised and interpreted by Dr. Leanna Battles. FLUOROSCOPY: Radiation Exposure Index (as provided by the fluoroscopic device): 29.0 mGy Kerma COMPARISON:  None Available. FINDINGS: Swallowing: Appears normal. No vestibular penetration or aspiration seen. Pharynx: Unremarkable.  Esophagus: No esophageal fold thickening. There is a stricture at the gastroesophageal junction. Esophageal motility: Present. Hiatal Hernia: None. Gastroesophageal reflux: None. Ingested 13mm barium tablet: Not given due to stricture. Other: Exam limited secondary to patient's immobility. 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 Associated dysmotility. Electronically Signed   By: Leanna Battles M.D.   On: 08/26/2023 16:48     LOS: 7 days   Signature  -    Susa Raring M.D on 08/27/2023 at 8:40 AM   -  To page go to [email or web address]

## 2023-08-27 NOTE — Progress Notes (Signed)
PHARMACY - ANTICOAGULATION CONSULT NOTE  Pharmacy Consult for heparin Indication: atrial fibrillation  Allergies  Allergen Reactions   Nebivolol Swelling    Chest pain (reaction to Bystolic)   Zestril [Lisinopril] Swelling and Other (See Comments)   Ace Inhibitors Swelling and Other (See Comments)    Tongue swell   Morphine And Codeine Itching    Patient Measurements: Height: 5\' 3"  (160 cm) Weight: 95.4 kg (210 lb 5.1 oz) IBW/kg (Calculated) : 52.4 Heparin Dosing Weight: 76kg  Vital Signs: Temp: 99.2 F (37.3 C) (11/19 0705) Temp Source: Oral (11/19 0705) BP: 127/74 (11/19 0705) Pulse Rate: 67 (11/19 0705)  Labs: Recent Labs    08/25/23 0310 08/26/23 0900  HGB 8.2* 7.9*  HCT 28.0* 27.3*  PLT 197 207  CREATININE 4.24* 6.29*    Estimated Creatinine Clearance: 8.9 mL/min (A) (by C-G formula based on SCr of 6.29 mg/dL (H)).   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   Full dentures    GERD (gastroesophageal reflux disease)    Gout    History of hiatal hernia    History of transfusion    Hx MRSA infection    abscess left groin   Hyperlipidemia    Hypertension    Hypothyroid    Intractable nausea and vomiting 02/15/2021   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HCC)    Peripheral vascular disease (HCC)    Sleep apnea    UNABLE TO TOLERATE C PAP   Stress incontinence    TIA (transient ischemic attack)     X2 NO RESIDUAL PROBLEMS   Wears glasses     Medications:  Medications Prior to Admission  Medication Sig Dispense Refill Last Dose   acetaminophen (TYLENOL) 500 MG  tablet Take 1 tablet (500 mg total) by mouth See admin instructions. Take 1,000mg  (2 tablets) by mouth twice a day in addition to 1,000mg  (2 tablets) twice daily as needed for pain. (Patient taking differently: Take 1,000 mg by mouth in the morning and at bedtime.)   08/19/2023   albuterol (PROVENTIL) (2.5 MG/3ML) 0.083% nebulizer solution Take 3 mLs (2.5 mg total) by nebulization every 6 (six) hours as needed for wheezing or shortness of breath. (Patient taking differently: Take 2.5 mg by nebulization 3 (three) times daily.) 90 mL 1 08/19/2023   albuterol (VENTOLIN HFA) 108 (90 Base) MCG/ACT inhaler Inhale 2 puffs into the lungs every 6 (six) hours as needed for shortness of breath (COPD). 18 g 1 Unk   allopurinol (ZYLOPRIM) 100 MG tablet Take 1 tablet (100 mg total) by mouth daily. 30 tablet 1 08/19/2023   apixaban (ELIQUIS) 5 MG TABS tablet Take 1 tablet (5 mg total) by mouth 2 (two) times daily. 60 tablet 2 08/19/2023 at PM   budesonide-formoterol (SYMBICORT) 160-4.5 MCG/ACT inhaler Inhale 2 puffs into the lungs in the morning and at bedtime. 10.2 g 12 08/19/2023   clopidogrel (PLAVIX) 75 MG tablet Take 75 mg by mouth daily.   08/19/2023   diclofenac Sodium (VOLTAREN) 1 % GEL Apply 2 g  topically 4 (four) times daily. (Patient taking differently: Apply 1 Application topically daily.)   08/19/2023   docusate sodium (COLACE) 100 MG capsule Take 1 capsule (100 mg total) by mouth daily. 100 capsule 0 08/19/2023   fluticasone (FLONASE) 50 MCG/ACT nasal spray Place 1 spray into both nostrils daily. (Patient taking differently: Place 2 sprays into both nostrils daily.) 16 g 2 Unk   insulin lispro (HUMALOG KWIKPEN) 100 UNIT/ML KwikPen 0-15 Units, Subcutaneous, 3 times daily with meal CBG < 70: Implement Hypoglycemia meausers CBG 70 - 120: 0 units CBG 121 - 150: 2 units CBG 151 - 200: 3 units CBG 201 - 250: 5 units CBG 251 - 300: 8 units CBG 301 - 350: 11 units CBG 351 - 400: 15 units CBG > 400: call MD  (Patient taking differently: Inject 0-15 Units into the skin daily. 0-15 Units,  CBG < 70: Implement Hypoglycemia meausers CBG 70 - 120: 0 units CBG 121 - 150: 2 units CBG 151 - 200: 3 units CBG 201 - 250: 5 units CBG 251 - 300: 8 units CBG 301 - 350: 11 units CBG 351 - 400: 15 units CBG > 400: call MD) 15 mL 11 08/19/2023   levothyroxine (SYNTHROID) 175 MCG tablet Take 1 tablet (175 mcg total) by mouth daily. 30 tablet 3 08/19/2023   lidocaine-prilocaine (EMLA) cream Apply 1 Application topically See admin instructions. On dialysis days (Tues,Thurs,Sat)   Unk   meclizine (ANTIVERT) 25 MG tablet Take 25 mg by mouth as needed for dizziness or nausea.   Unk   metoprolol tartrate (LOPRESSOR) 25 MG tablet Take 1.5 tablets (37.5 mg total) by mouth 2 (two) times daily. 90 tablet 1 08/19/2023   midodrine (PROAMATINE) 5 MG tablet Take 3 tablets (15 mg total) by mouth every 8 (eight) hours. 270 tablet 1 08/19/2023   Multiple Vitamins-Iron (MULTIVITAMINS WITH IRON) TABS tablet Take 1 tablet by mouth in the morning. 30 tablet 1 08/19/2023   nitroGLYCERIN (NITROSTAT) 0.4 MG SL tablet Place 1 tablet (0.4 mg total) under the tongue every 5 (five) minutes as needed for chest pain. 20 tablet 2 Unk   ondansetron (ZOFRAN-ODT) 4 MG disintegrating tablet Take 2 mg by mouth every 6 (six) hours as needed for nausea or vomiting.   Unk   pantoprazole (PROTONIX) 40 MG tablet Take 1 tablet (40 mg total) by mouth 2 (two) times daily before a meal. (Patient taking differently: Take 40 mg by mouth daily.) 60 tablet 2 08/19/2023   rosuvastatin (CRESTOR) 20 MG tablet Take 1 tablet (20 mg total) by mouth daily. 30 tablet 2 08/19/2023   sevelamer carbonate (RENVELA) 800 MG tablet Take 2 tablets (1,600 mg total) by mouth 3 (three) times daily with meals. 180 tablet 2 08/19/2023   thiamine (VITAMIN B1) 100 MG tablet Take 1 tablet (100 mg total) by mouth in the morning. 30 tablet 2 08/19/2023   amiodarone (PACERONE) 200 MG tablet Take 2  tablets (400 mg total) by mouth 2 (two) times daily for 7 days, THEN 2 tablets (400 mg total) daily for 7 days, THEN 1 tablet (200 mg total) daily. (Patient not taking: Reported on 08/20/2023) 90 tablet 2 Not Taking   lidocaine 4 % Place 2 patches onto the skin daily. Each lower side of back. (Patient not taking: Reported on 08/20/2023) 60 patch 1 Not Taking   Methoxy PEG-Epoetin Beta (MIRCERA IJ) Inject 1 Syringe into the skin every 14 (fourteen) days.      OXYGEN Inhale into the  lungs.      pregabalin (LYRICA) 25 MG capsule Take 25 mg by mouth daily. (Patient not taking: Reported on 08/20/2023)   Not Taking   umeclidinium bromide (INCRUSE ELLIPTA) 62.5 MCG/ACT AEPB Inhale 1 puff into the lungs daily. (Patient not taking: Reported on 08/20/2023) 30 each 3 Not Taking   Scheduled:   allopurinol  100 mg Oral Daily   arformoterol  15 mcg Nebulization BID   budesonide (PULMICORT) nebulizer solution  0.5 mg Nebulization BID   Chlorhexidine Gluconate Cloth  6 each Topical Q0600   Chlorhexidine Gluconate Cloth  6 each Topical Q0600   colchicine  0.3 mg Oral BID   diclofenac Sodium  2 g Topical QID   insulin aspart  0-5 Units Subcutaneous QHS   insulin aspart  0-9 Units Subcutaneous TID WC   insulin glargine-yfgn  7 Units Subcutaneous Once   leptospermum manuka honey  1 Application Topical Daily   levothyroxine  175 mcg Oral Q0600   metoprolol tartrate  25 mg Oral BID   midodrine  10 mg Oral Q8H   pantoprazole  40 mg Oral BID AC   revefenacin  175 mcg Nebulization Daily   rosuvastatin  20 mg Oral Daily   sevelamer carbonate  1,600 mg Oral TID WC   sodium chloride flush  10-40 mL Intracatheter Q12H    Assessment: Pt has been on apixaban for AF. Due to esophageal stricture, heparin will be used to bridge pending GI eval. We will use APTT for now to monitor.   Hgb ~8, plt wnl  Goal of Therapy:  Heparin level 0.3-0.7 units/ml aPTT 66-102 seconds Monitor platelets by anticoagulation protocol:  Yes   Plan:  Hold apixaban Heparin 1150 units/hr - start 8 PM Check 8 hr HL/PTT F/u resume apixaban  Ulyses Southward, PharmD, BCIDP, AAHIVP, CPP Infectious Disease Pharmacist 08/27/2023 9:05 AM

## 2023-08-27 NOTE — Progress Notes (Signed)
[facility] Progress Note  Subjective: seen in room. No c/o's, on 3 L O2.   Vitals:   08/27/23 0348 08/27/23 0705 08/27/23 0800 08/27/23 1120  BP: (!) 117/38 127/74 (!) 142/56 (!) 126/47  Pulse: 74 67 75 66  Resp: 19 20 17 11   Temp: 99.1 F (37.3 C) 99.2 F (37.3 C)  98.9 F (37.2 C)  TempSrc: Oral Oral  Oral  SpO2: 97%  100%   Weight:      Height:        Exam: Gen alert, not in distress, 3 L [place] No jvd or bruits Chest some basilar rales bilat, no ^wob RRR no RG Abd soft ntnd no mass or ascites +bs MS no joint effusions or deformity Ext no LE edema Neuro is alert, nf    LUA AVG+bruit           Renal-related home meds: - metoprolol 25 bid - home O2 3 L - eliquis 5 bid - midodrine 15 tid - lyrica 25 qam - renvela 2 ac tid    OP HD: [place] [facility] TTS  4h   400/600   97.5kg  2/2 bath  LUA AVG  Heparin 2000 - last OP HD 11/09, post wt 101.3kg (+4 kg) - coming off 2-4kg over the last 2 wks - hectorol 3 mcg  - mircera 100 mcg IV q 2, last 11/2, due 11/16     Assessment/ Plan: 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 BNP was up at 1345. Has not been getting to dry wt last 2 wks. Had HD 11/12 here w/ 2.5 L UF. Pt decompensated 11/14 and required bipap for poor mental status and severe hypercarbia. CXR 11/14 showed early edema and pt had HD w/ 2.5 L off w/ stable BP's. Post HD the O2 was weaned down to 4 L [place]. Since then has weaned down to 3 L which is what she wears at home. After HD Monday wt's were 2kg under the dry wt.  Volume - early pulm edema by CXR 11/14, improved on repeat CXR's.  IV access: very difficult, IR placed a tunneled PICC for IV access.  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 No missed HD. Had HD Monday off schedule. Next HD Thursday.  BP - on midodrine 10 tid for now. 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 Follow.  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 Cont binders.  COPD - on home O2 3 L  [place] Dispo - ok for dc from renal standpoint    [name] MD  [facility] 08/27/2023, 3:30 PM  Recent Labs  Lab 08/25/23 0310 08/26/23 0900  HGB 8.2* 7.9*  ALBUMIN 3.1* 2.8*  CALCIUM 9.0 9.2  PHOS 2.9 3.3  CREATININE 4.24* 6.29*  K 3.6 3.7   No results for input(s): "IRON", "TIBC", "FERRITIN" in the last 168 hours. Inpatient medications:  allopurinol  100 mg Oral Daily   arformoterol  15 mcg Nebulization BID   budesonide (PULMICORT) nebulizer solution  0.5 mg Nebulization BID   Chlorhexidine Gluconate Cloth  6 each Topical Q0600   Chlorhexidine Gluconate Cloth  6 each Topical Q0600   colchicine  0.3 mg Oral BID   diclofenac Sodium  2 g Topical QID   insulin aspart  0-5 Units Subcutaneous QHS   insulin aspart  0-9 Units Subcutaneous TID WC   leptospermum manuka honey  1 Application Topical Daily  levothyroxine  175 mcg Oral Q0600   metoprolol tartrate  25 mg Oral BID   midodrine  10 mg Oral Q8H   pantoprazole  40 mg Oral BID AC   revefenacin  175 mcg Nebulization Daily   rosuvastatin  20 mg Oral Daily   sevelamer carbonate  1,600 mg Oral TID WC   sodium chloride flush  10-40 mL Intracatheter Q12H    heparin      acetaminophen **OR** acetaminophen, albuterol, hydrALAZINE, hydrOXYzine, meclizine

## 2023-08-27 NOTE — Consult Note (Signed)
[facility] Consult  Referring Provider: [facility] hospitalist/Dr. Susa Raring Primary Care Physician:  Vladimir Crofts, FNP Primary Gastroenterologist: Dr. [facility]  Reason for Consultation: Dysphagia/abnormal barium swallow  HPI: [name] [name] is a 72 y.o. female with past medical history of end-stage renal disease on hemodialysis, Tuesday, Thursday, Saturday, heart failure, COPD on oxygen at home 3 L/min, nonobstructive coronary artery disease, paroxysmal atrial fibrillation on Eliquis, last dose yesterday, small cell lung cancer status post radiation completed 04/06/2022, obstructive sleep apnea intolerant to CPAP, hypertension, diabetes, admitted to the hospital on 08/20/2023 with shortness of breath, being treated for pneumonia.  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 Barium swallow 03/2019 showed dysmotility no stricture. Patient reports difficulty swallowing solids as well as liquids, nonprogressive, present for last several months, not associated with unintentional weight loss.   She denies acid reflux or heartburn. She reports that occasionally stools are dark but has not noted blood in her stool or black stool. Denies abdominal pain.  Prior GI workup: Colonoscopy 2018: Diverticulosis and hemorrhoids, repeat not recommended due to comorbidities Colonoscopy, 2013: Diverticulosis and hemorrhoids Capsule endoscopy 06/2021: Proximal small bowel AVM Enteroscopy 07/2021: AVM treated with APC at 140 cm, 150 cm and 160 cm   Past Medical History:  Diagnosis Date   Anemia    Arthritis    Asthma    Chronic diastolic (congestive) heart failure (HCC)    Chronic kidney disease    Dialysis T-TH-Sat   Chronic pain syndrome    Class 2 obesity due to excess calories with body mass index (BMI) of 36.0 to 36.9 in adult    COPD (chronic obstructive pulmonary disease) (HCC)    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   Full dentures    GERD (gastroesophageal reflux disease)    Gout    History of hiatal hernia    History of transfusion    Hx MRSA infection    abscess left groin   Hyperlipidemia    Hypertension    Hypothyroid    Intractable nausea and vomiting 02/15/2021   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HCC)    Peripheral vascular disease (HCC)    Sleep apnea    UNABLE TO TOLERATE C PAP   Stress incontinence    TIA (transient ischemic attack)     X2 NO RESIDUAL PROBLEMS   Wears glasses     Past Surgical History:  Procedure Laterality Date   ABDOMINAL HYSTERECTOMY     AV FISTULA PLACEMENT Left 12/01/2021   Procedure: INSERTION OF LEFT ARM ARTERIOVENOUS (AV) GORE-TEX GRAFT;  Surgeon: Nada Libman, MD;  Location: [facility] OR;  Service: Vascular;  Laterality: Left;   BACK SURGERY  2020   BASCILIC VEIN TRANSPOSITION Left 04/05/2020   Procedure: BASILIC VEIN TRANSPOSITION FIRST STAGE;  Surgeon: Chuck Hint, MD;  Location: [facility] OR;  Service: Vascular;  Laterality: Left;   BRONCHIAL BIOPSY  01/23/2022   Procedure: BRONCHIAL BIOPSIES;  Surgeon: Josephine Igo, DO;  Location: [facility] ENDOSCOPY;  Service: Pulmonary;;   BRONCHIAL BIOPSY  08/07/2022   Procedure: BRONCHIAL BIOPSIES;  Surgeon: Josephine Igo, DO;  Location: [facility] ENDOSCOPY;  Service: Pulmonary;;   BRONCHIAL BRUSHINGS  08/07/2022   Procedure: BRONCHIAL BRUSHINGS;  Surgeon: Josephine Igo, DO;  Location: [facility]OSCOPY;  Service: Pulmonary;;   BRONCHIAL NEEDLE ASPIRATION BIOPSY  01/23/2022   Procedure: BRONCHIAL NEEDLE ASPIRATION BIOPSIES;  Surgeon: Josephine Igo, DO;  Location: [facility] ENDOSCOPY;  Service: Pulmonary;;   BRONCHIAL NEEDLE ASPIRATION BIOPSY  08/07/2022   Procedure: BRONCHIAL NEEDLE ASPIRATION BIOPSIES;  Surgeon: Josephine Igo, DO;  Location: [facility] ENDOS[facility];  Service: Pulmonary;;   COLON SURGERY  1995   DUE TO POLYP   DILATION AND CURETTAGE OF UTERUS     ENDOBRONCHIAL  ULTRASOUND  01/23/2022   Procedure: ENDOBRONCHIAL ULTRASOUND;  Surgeon: Josephine Igo, DO;  Location: [facility]OS[facility];  Service: Pulmonary;;   ENTEROSCOPY N/A 07/10/2021   Procedure: ENTEROSCOPY;  Surgeon: Kerin Salen, MD;  Location[facility] ENDOSCOPY;  Service: Gastroenterology;  Laterality: N/A;   ESOPHAGOGASTRODUODENOSCOPY N/A 01/25/2021   Procedure: ESOPHAGOGASTRODUODENOSCOPY (EGD);  Surgeon: Vida Rigger, MD;  Location: [facility] ENDOSCOPY;  Service: Endoscopy;  Laterality: N/A;   FINE NEEDLE ASPIRATION  01/23/2022   Procedure: FINE NEEDLE ASPIRATION (FNA) LINEAR;  Surgeon: Josephine Igo, DO;  Location: [facility];  Service: Pulmonary;;   FOREARM FRACTURE SURGERY     Left arm   GIVENS CAPSULE STUDY N/A 07/07/2021   Procedure: GIVENS CAPSULE STUDY;  Surgeon: Vida Rigger, MD;  Location: [facility] ENDOSCOPY;  Service: Endoscopy;  Laterality: N/A;   HERNIA REPAIR     w/ mesh   HOT HEMOSTASIS N/A 07/10/2021   Procedure: HOT HEMOSTASIS (ARGON PLASMA COAGULATION/BICAP);  Surgeon: Kerin Salen, MD;  Location: [facility] ENDOSCOPY;  Service: Gastroenterology;  Laterality: N/A;   INCISION AND DRAINAGE ABSCESS Left 02/04/2013   Procedure: INCISION AND DRAINAGE LEFT BUTTOCK ABSCESS; INCISION AND DRAINAGE LEFT BREAST ABSCESS;  Surgeon: Shelly Rubenstein, MD;  Location: [facility] OR;  Service: General;  Laterality: Left;   INCISION AND DRAINAGE ABSCESS N/A 02/12/2013   Procedure: INCISION AND DEBRIDEMENT BUTTOCK WOUND ;  Surgeon: Wilmon Arms. Corliss Skains, MD;  Location: [facility] OR;  Service: General;  Laterality: N/A;   INCISION AND DRAINAGE ABSCESS N/A 02/14/2013   Procedure: INCISION AND DRAINAGE/DRESSING CHANGE;  Surgeon: Shelly Rubenstein, MD;  Location: [facility] OR;  Service: General;  Laterality: N/A;   INCISION AND DRAINAGE ABSCESS N/A 03/21/2015   Procedure: INCISION AND DRAINAGE PUBIC ABSCESS;  Surgeon: Glenna Fellows, MD;  Location: [facility] ORS;  Service: General;  Laterality: N/A;   INCISION AND DRAINAGE PERIRECTAL ABSCESS Left 02/10/2013   Procedure:  IRRIGATION AND DEBRIDEMENT OF BUTTOCK/PERINEAL ABSCESS;  Surgeon: Wilmon Arms. Corliss Skains, MD;  Location: [facility] OR;  Service: General;  Laterality: Left;   INCISION AND DRAINAGE PERIRECTAL ABSCESS N/A 02/16/2013   Procedure: IRRIGATION AND DEBRIDEMENT PERINEAL ABSCESS;  Surgeon: Liz Malady, MD;  Location: [facility] OR;  Service: General;  Laterality: N/A;   IR FLUORO GUIDE CV LINE RIGHT  11/30/2021   IR FLUORO GUIDE CV LINE RIGHT  08/22/2023   IR US GUIDE VASC ACCESS RIGHT  11/30/2021   IR US GUIDE VASC ACCESS RIGHT  08/22/2023   IRRIGATION AND DEBRIDEMENT ABSCESS N/A 02/06/2013   Procedure: IRRIGATION AND DEBRIDEMENT BUTTOCK ABSCESS AND DRESSING CHANGE;  Surgeon: Shelly Rubenstein, MD;  Location: [facility] OR;  Service: General;  Laterality: N/A;   IRRIGATION AND DEBRIDEMENT ABSCESS Left 02/08/2013   Procedure: IRRIGATION AND DEBRIDEMENT ABSCESS/DRESSING CHANGE;  Surgeon: Cherylynn Ridges, MD;  Location: [facility] OR;  Service: General;  Laterality: Left;   JOINT REPLACEMENT  2010 / 2012    LAPAROSCOPIC CHOLECYSTECTOMY     LEFT HEART CATH AND CORONARY ANGIOGRAPHY N/A 07/05/2023   Procedure: LEFT HEART CATH AND CORONARY ANGIOGRAPHY;  Surgeon: Lennette Bihari, MD;  [facility]: [facility] INVASIVE CV LAB;  Service: Cardiovascular;  Laterality: N/A;   LEFT HEART CATHETERIZATION  WITH CORONARY ANGIOGRAM N/A 07/02/2014   Procedure: LEFT HEART CATHETERIZATION WITH CORONARY ANGIOGRAM;  Surgeon: Runell Gess, MD;  Location: [facility] CATH LAB;  Service: Cardiovascular;  Laterality: N/A;   LOWER EXTREMITY ANGIOGRAM Right 04/20/2016   Procedure: Lower Extremity Angiogram;  Surgeon: Sherren Kerns, MD;  Location: [facility] INVASIVE CV LAB;  Service: Cardiovascular;  Laterality: Right;   MULTIPLE TOOTH EXTRACTIONS     PERIPHERAL VASCULAR CATHETERIZATION N/A 04/20/2016   Procedure: Abdominal Aortogram;  Surgeon: Sherren Kerns, MD;  Location: [facility] INVASIVE CV LAB;  Service: Cardiovascular;  Laterality: N/A;   PERIPHERAL VASCULAR CATHETERIZATION Right 04/20/2016    Procedure: Peripheral Vascular Intervention;  Surgeon: Sherren Kerns, MD;  Location: [facility] INVASIVE CV LAB;  Service: Cardiovascular;  Laterality: Right;  popiteal   RIGHT HEART CATH N/A 10/27/2018   Procedure: RIGHT HEART CATH;  Surgeon: Laurey Morale, MD;  Location: [facility] INVASIVE CV LAB;  Service: Cardiovascular;  Laterality: N/A;   RIGHT HEART CATH N/A 03/20/2019   Procedure: RIGHT HEART CATH;  Surgeon: Laurey Morale, MD;  Location: [facility] INVASIVE CV LAB;  Service: Cardiovascular;  Laterality: N/A;   THYROIDECTOMY     TOTAL HIP REVISION Right 08/11/2014   Procedure: RIGHT ACETABULAR REVISION;  Surgeon: Loanne Drilling, MD;  Location: [facility] ORS;  Service: Orthopedics;  Laterality: Right;   VASCULAR SURGERY     VIDEO BRONCHOSCOPY WITH ENDOBRONCHIAL ULTRASOUND Bilateral 08/07/2022   Procedure: VIDEO BRONCHOSCOPY WITH ENDOBRONCHIAL ULTRASOUND;  Surgeon: Josephine Igo, DO;  Location: [facility] ENDOSCOPY;  Service: Pulmonary;  Laterality: Bilateral;   VIDEO BRONCHOSCOPY WITH RADIAL ENDOBRONCHIAL ULTRASOUND  01/23/2022   Procedure: RADIAL ENDOBRONCHIAL ULTRASOUND;  Surgeon: Josephine Igo, DO;  Location: [facility] ENDOSCOPY;  Service: Pulmonary;;    Prior to Admission medications   Medication Sig Start Date End Date Taking? Authorizing Provider  acetaminophen (TYLENOL) 500 MG tablet Take 1 tablet (500 mg total) by mouth See admin instructions. Take 1,000mg  (2 tablets) by mouth twice a day in addition to 1,000mg  (2 tablets) twice daily as needed for pain. Patient taking differently: Take 1,000 mg by mouth in the morning and at bedtime. 05/14/23  Yes Ghimire, Werner Lean, MD  albuterol (PROVENTIL) (2.5 MG/3ML) 0.083% nebulizer solution Take 3 mLs (2.5 mg total) by nebulization every 6 (six) hours as needed for wheezing or shortness of breath. Patient taking differently: Take 2.5 mg by nebulization 3 (three) times daily. 05/14/23  Yes Ghimire, Werner Lean, MD  albuterol (VENTOLIN HFA) 108 (90 Base) MCG/ACT inhaler Inhale 2  puffs into the lungs every 6 (six) hours as needed for shortness of breath (COPD). 05/14/23  Yes Ghimire, Werner Lean, MD  allopurinol (ZYLOPRIM) 100 MG tablet Take 1 tablet (100 mg total) by mouth daily. 05/14/23  Yes Ghimire, Werner Lean, MD  apixaban (ELIQUIS) 5 MG TABS tablet Take 1 tablet (5 mg total) by mouth 2 (two) times daily. 07/20/23  Yes Ghimire, Werner Lean, MD  budesonide-formoterol (SYMBICORT) 160-4.5 MCG/ACT inhaler Inhale 2 puffs into the lungs in the morning and at bedtime. 05/14/23  Yes Ghimire, Werner Lean, MD  clopidogrel (PLAVIX) 75 MG tablet Take 75 mg by mouth daily.   Yes [provider]  diclofenac Sodium (VOLTAREN) 1 % GEL Apply 2 g topically 4 (four) times daily. Patient taking differently: Apply 1 Application topically daily. 04/17/23  Yes Marlin Canary U, DO  docusate sodium (COLACE) 100 MG capsule Take 1 capsule (100 mg total) by mouth daily. 05/14/23  Yes Ghimire, Werner Lean, MD  fluticasone (FLONASE) 50 MCG/ACT nasal spray Place 1 spray into both nostrils daily. Patient taking differently: Place 2 sprays into both nostrils daily. 05/14/23  Yes Ghimire, Werner Lean, MD  insulin lispro (HUMALOG KWIKPEN) 100 UNIT/ML KwikPen 0-15 Units, Subcutaneous, 3 times daily with meal CBG < 70: Implement Hypoglycemia meausers CBG 70 - 120: 0 units CBG 121 - 150: 2 units CBG 151 - 200: 3 units CBG 201 - 250: 5 units CBG 251 - 300: 8 units CBG 301 - 350: 11 units CBG 351 - 400: 15 units CBG > 400: call MD Patient taking differently: Inject 0-15 Units into the skin daily. 0-15 Units,  CBG < 70: Implement Hypoglycemia meausers CBG 70 - 120: 0 units CBG 121 - 150: 2 units CBG 151 - 200: 3 units CBG 201 - 250: 5 units CBG 251 - 300: 8 units CBG 301 - 350: 11 units CBG 351 - 400: 15 units CBG > 400: call MD 05/14/23  Yes Ghimire, Werner Lean, MD  levothyroxine (SYNTHROID) 175 MCG tablet Take 1 tablet (175 mcg total) by mouth daily. 05/14/23  Yes Ghimire, Werner Lean, MD  lidocaine-prilocaine (EMLA) cream Apply  1 Application topically See admin instructions. On dialysis days (Tues,Thurs,Sat) 02/13/22  Yes [provider]  meclizine (ANTIVERT) 25 MG tablet Take 25 mg by mouth as needed for dizziness or nausea. 06/12/23  Yes [provider]  metoprolol tartrate (LOPRESSOR) 25 MG tablet Take 1.5 tablets (37.5 mg total) by mouth 2 (two) times daily. 07/20/23  Yes Ghimire, Werner Lean, MD  midodrine (PROAMATINE) 5 MG tablet Take 3 tablets (15 mg total) by mouth every 8 (eight) hours. 07/20/23  Yes Ghimire, Werner Lean, MD  Multiple Vitamins-Iron (MULTIVITAMINS WITH IRON) TABS tablet Take 1 tablet by mouth in the morning. 05/14/23  Yes Ghimire, Werner Lean, MD  nitroGLYCERIN (NITROSTAT) 0.4 MG SL tablet Place 1 tablet (0.4 mg total) under the tongue every 5 (five) minutes as needed for chest pain. 07/06/23 07/05/24 Yes Cyndi Bender, NP  ondansetron (ZOFRAN-ODT) 4 MG disintegrating tablet Take 2 mg by mouth every 6 (six) hours as needed for nausea or vomiting. 07/10/23  Yes [provider]  pantoprazole (PROTONIX) 40 MG tablet Take 1 tablet (40 mg total) by mouth 2 (two) times daily before a meal. Patient taking differently: Take 40 mg by mouth daily. 05/14/23  Yes Ghimire, Werner Lean, MD  rosuvastatin (CRESTOR) 20 MG tablet Take 1 tablet (20 mg total) by mouth daily. 07/06/23  Yes Cyndi Bender, NP  sevelamer carbonate (RENVELA) 800 MG tablet Take 2 tablets (1,600 mg total) by mouth 3 (three) times daily with meals. 05/14/23  Yes Ghimire, Werner Lean, MD  thiamine (VITAMIN B1) 100 MG tablet Take 1 tablet (100 mg total) by mouth in the morning. 05/14/23  Yes Ghimire, Werner Lean, MD  amiodarone (PACERONE) 200 MG tablet Take 2 tablets (400 mg total) by mouth 2 (two) times daily for 7 days, THEN 2 tablets (400 mg total) daily for 7 days, THEN 1 tablet (200 mg total) daily. Patient not taking: Reported on 08/20/2023 07/20/23 09/20/23  Maretta Bees, MD  lidocaine 4 % Place 2 patches onto the skin daily. Each lower side  of back. Patient not taking: Reported on 08/20/2023 05/14/23   Maretta Bees, MD  Methoxy PEG-Epoetin Beta (MIRCERA IJ) Inject 1 Syringe into the skin every 14 (fourteen) days. 06/01/23 05/30/24  [provider]  OXYGEN Inhale into the lungs.    [provider]  pregabalin (  LYRICA) 25 MG capsule Take 25 mg by mouth daily. Patient not taking: Reported on 08/20/2023 05/29/23   [provider]  umeclidinium bromide (INCRUSE ELLIPTA) 62.5 MCG/ACT AEPB Inhale 1 puff into the lungs daily. Patient not taking: Reported on 08/20/2023 05/14/23   Maretta Bees, MD    Current Facility-Administered Medications  Medication Dose Route Frequency Provider Last Rate Last Admin   acetaminophen (TYLENOL) tablet 650 mg  650 mg Oral Q6H PRN Dahal, Melina Schools, MD   650 mg at 08/27/23 1249   Or   acetaminophen (TYLENOL) suppository 650 mg  650 mg Rectal Q6H PRN Dahal, Melina Schools, MD       albuterol (PROVENTIL) (2.5 MG/3ML) 0.083% nebulizer solution 2.5 mg  2.5 mg Nebulization Q2H PRN Ghimire, Werner Lean, MD       allopurinol (ZYLOPRIM) tablet 100 mg  100 mg Oral Daily Dahal, Binaya, MD   100 mg at 08/27/23 0844   arformoterol (BROVANA) nebulizer solution 15 mcg  15 mcg Nebulization BID Maretta Bees, MD   15 mcg at 08/27/23 0756   budesonide (PULMICORT) nebulizer solution 0.5 mg  0.5 mg Nebulization BID Lidia Collum, PA-C   0.5 mg at 08/27/23 0757   Chlorhexidine Gluconate Cloth 2 % PADS 6 each  6 each Topical Q0600 Delano Metz, MD   6 each at 08/27/23 0617   Chlorhexidine Gluconate Cloth 2 % PADS 6 each  6 each Topical Q0600 Delano Metz, MD   6 each at 08/27/23 0617   colchicine tablet 0.3 mg  0.3 mg Oral BID Leroy Sea, MD   0.3 mg at 08/27/23 1019   diclofenac Sodium (VOLTAREN) 1 % topical gel 2 g  2 g Topical QID Leroy Sea, MD   2 g at 08/27/23 0854   heparin ADULT infusion 100 units/mL (25000 units/279mL)  1,150 Units/hr Intravenous Continuous Kerin Salen, MD        hydrALAZINE (APRESOLINE) injection 10 mg  10 mg Intravenous Q6H PRN Dahal, Melina Schools, MD       hydrOXYzine (ATARAX) 10 MG/5ML syrup 10 mg  10 mg Oral TID PRN Leroy Sea, MD   10 mg at 08/27/23 0346   insulin aspart (novoLOG) injection 0-5 Units  0-5 Units Subcutaneous QHS Lorin Glass, MD   2 Units at 08/26/23 2116   insulin aspart (novoLOG) injection 0-9 Units  0-9 Units Subcutaneous TID WC Lorin Glass, MD   2 Units at 08/27/23 1249   leptospermum manuka honey (MEDIHONEY) paste 1 Application  1 Application Topical Daily Dahal, Melina Schools, MD   1 Application at 08/27/23 1250   levothyroxine (SYNTHROID) tablet 175 mcg  175 mcg Oral Q0600 Lorin Glass, MD   175 mcg at 08/27/23 0541   meclizine (ANTIVERT) tablet 25 mg  25 mg Oral PRN Dahal, Melina Schools, MD       metoprolol tartrate (LOPRESSOR) tablet 25 mg  25 mg Oral BID Leroy Sea, MD   25 mg at 08/27/23 0843   midodrine (PROAMATINE) tablet 10 mg  10 mg Oral Q8H Dahal, Binaya, MD   10 mg at 08/27/23 0541   pantoprazole (PROTONIX) EC tablet 40 mg  40 mg Oral BID AC Dahal, Melina Schools, MD   40 mg at 08/27/23 0843   revefenacin (YUPELRI) nebulizer solution 175 mcg  175 mcg Nebulization Daily Maretta Bees, MD   175 mcg at 08/27/23 0757   rosuvastatin (CRESTOR) tablet 20 mg  20 mg Oral Daily Dahal, Melina Schools, MD   20 mg at  08/27/23 0844   sevelamer carbonate (RENVELA) tablet 1,600 mg  1,600 mg Oral TID WC Dahal, Melina Schools, MD   1,600 mg at 08/27/23 1249   sodium chloride flush (NS) 0.9 % injection 10-40 mL  10-40 mL Intracatheter Q12H Ghimire, Werner Lean, MD   10 mL at 08/27/23 1019    Allergies as of 08/20/2023 - Review Complete 08/20/2023  Allergen Reaction Noted   Nebivolol Swelling 04/17/2015   Zestril [lisinopril] Swelling and Other (See Comments) 07/11/2022   Ace inhibitors Swelling and Other (See Comments) 11/21/2010   Morphine and codeine Itching 11/21/2010    Family History  Problem Relation Age of Onset   Diabetes Brother     Cardiomyopathy Mother    Heart disease Mother    Cancer Father     Social History   Socioeconomic History   Marital status: Legally Separated    Spouse name: Not on file   Number of children: 1   Years of education: college   Highest education level: Not on file  Occupational History    Comment: Disabled  Tobacco Use   Smoking status: Former    Current packs/day: 0.00    Average packs/day: 0.3 packs/day for 40.0 years (10.0 ttl pk-yrs)    Types: Cigarettes    Start date: 10/1981    Quit date: 10/2021    Years since quitting: 1.8    Passive exposure: Past   Smokeless tobacco: Former  Building services engineer status: Never Used  Substance and Sexual Activity   Alcohol use: Yes    Alcohol/week: 1.0 standard drink of alcohol    Types: 1 Standard drinks or equivalent per week    Comment: OCC   Drug use: No   Sexual activity: Not Currently    Birth control/protection: Surgical    Comment: Hysterectomy  Other Topics Concern   Not on file  Social History Narrative   ** Merged History Encounter **       Patient is separated  and lives at home with her friend. Patient is disabled. Education one year of college Right handed Caffeine two cups daily.     Social Determinants of Health   Financial Resource Strain: Not on file  Food Insecurity: No Food Insecurity (08/20/2023)   Hunger Vital Sign    Worried About Running Out of Food in the Last Year: Never true    Ran Out of Food in the Last Year: Never true  Transportation Needs: No Transportation Needs (08/20/2023)   PRAPARE - Administrator, Civil Service (Medical): No    Lack of Transportation (Non-Medical): No  Physical Activity: Not on file  Stress: Not on file  Social Connections: Not on file  Intimate Partner Violence: Not At Risk (08/20/2023)   Humiliation, Afraid, Rape, and Kick questionnaire    Fear of Current or Ex-Partner: No    Emotionally Abused: No    Physically Abused: No    Sexually Abused: No     Review of Systems: Occasional nosebleeds, otherwise as per HPI  Physical Exam: Vital signs in last 24 hours: Temp:  [98.5 F (36.9 C)-99.2 F (37.3 C)] 98.9 F (37.2 C) (11/19 1120) Pulse Rate:  [66-75] 66 (11/19 1120) Resp:  [11-20] 11 (11/19 1120) BP: (93-142)/(38-74) 126/47 (11/19 1120) SpO2:  [97 %-100 %] 100 % (11/19 0800) FiO2 (%):  [40 %] 40 % (11/18 2239)    General:   Overweight  Head:  Normocephalic and atraumatic. Eyes:  Sclera clear, no icterus.  Mild pallor  Ears:  Normal auditory acuity. Nose:  No deformity, discharge,  or lesions. Mouth:  No deformity or lesions.  Oropharynx pink & moist. Neck:  Supple; no masses or thyromegaly. Lungs:  Clear throughout to auscultation.   No wheezes, crackles, or rhonchi. No acute distress. Heart: Hemodialysis catheter over right chest, regular rate and rhythm; no murmurs, clicks, rubs,  or gallops. Extremities:  Without clubbing or edema. Neurologic:  Alert and  oriented x4;  grossly normal neurologically. Skin:  Intact without significant lesions or rashes. Psych:  Alert and cooperative. Normal mood and affect. Abdomen:  Soft, nontender and nondistended. No masses, hepatosplenomegaly or hernias noted. Normal bowel sounds, without guarding, and without rebound.         Lab Results: Recent Labs    08/25/23 0310 08/26/23 0900  WBC 8.4 8.4  HGB 8.2* 7.9*  HCT 28.0* 27.3*  PLT 197 207   BMET Recent Labs    08/25/23 0310 08/26/23 0900  NA 135 137  K 3.6 3.7  CL 96* 98  CO2 29 27  GLUCOSE 153* 132*  BUN 29* 48*  CREATININE 4.24* 6.29*  CALCIUM 9.0 9.2   LFT Recent Labs    08/26/23 0900  ALBUMIN 2.8*   PT/INR No results for input(s): "LABPROT", "INR" in the last 72 hours.  Studies/Results: DG Ankle Complete Left  Result Date: 08/26/2023 CLINICAL DATA:  Pain in anterior ankle EXAM: LEFT ANKLE COMPLETE - 3+ VIEW COMPARISON:  04/12/2021 FINDINGS: Frontal, oblique, lateral views of the left ankle are  obtained. The bones are severely osteopenic. No acute fracture, subluxation, or dislocation. Osteoarthritis of the ankle and hindfoot. Mild diffuse subcutaneous edema. Diffuse vascular calcifications. IMPRESSION: 1. Osteopenia.  No acute fracture. 2. Osteoarthritis of the ankle and hindfoot. 3. Diffuse subcutaneous edema. Electronically Signed   By: Sharlet Salina M.D.   On: 08/26/2023 21:37   DG ESOPHAGUS W SINGLE CM (SOL OR THIN BA)  Result Date: 08/26/2023 CLINICAL DATA:  Dysphagia with concern for stricture. EXAM: ESOPHAGUS/BARIUM SWALLOW/TABLET STUDY TECHNIQUE: Single contrast examination was performed using thin liquid barium. This exam was performed by Corrin Parker, PA-C, and was supervised and interpreted by Dr. Leanna Battles. FLUOROSCOPY: Radiation Exposure Index (as provided by the fluoroscopic device): 29.0 mGy Kerma COMPARISON:  None Available. FINDINGS: Swallowing: Appears normal. No vestibular penetration or aspiration seen. Pharynx: Unremarkable. Esophagus: No esophageal fold thickening. There is a stricture at the gastroesophageal junction. Esophageal motility: Present. Hiatal Hernia: None. Gastroesophageal reflux: None. Ingested 13mm barium tablet: Not given due to stricture. Other: Exam limited secondary to patient's immobility. 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 Associated dysmotility. Electronically Signed   By: Leanna Battles M.D.   On: 08/26/2023 16:48    Impression: Dysphagia, abnormal barium swallow concerning for high-grade stricture at GE junction   End-stage renal disease on hemodialysis, hemoglobin 7.9, elevated MCV 107.1, BUN 48, creatinine 6.29  Other comorbidities: Pneumonia, pulmonary edema, COPD, obstructive sleep apnea, heart failure, gout, hypothyroidism, history of lung cancer status post radiation, osteoarthritis, debility/deconditioning  Atrial fibrillation, was on Eliquis, last dose yesterday, last dose of Plavix  08/23/2023  Plan: Patient has been switched to heparin. EGD with possible balloon dilation scheduled for 9 AM tomorrow. Will order for heparin to be held at 3 AM. The risks and the benefits of the procedure were discussed with the patient in details. She understands and verbalizes consent.   LOS: 7 days   Kerin Salen, MD  08/27/2023, 1:57 PM

## 2023-08-27 NOTE — Plan of Care (Signed)
  Problem: Education: Goal: Ability to describe self-care measures that may prevent or decrease complications (Diabetes Survival Skills Education) will improve Outcome: Progressing Goal: Individualized Educational Video(s) Outcome: Progressing   Problem: Coping: Goal: Ability to adjust to condition or change in health will improve Outcome: Progressing   Problem: Fluid Volume: Goal: Ability to maintain a balanced intake and output will improve Outcome: Progressing   Problem: Health Behavior/Discharge Planning: Goal: Ability to identify and utilize available resources and services will improve Outcome: Progressing Goal: Ability to manage health-related needs will improve Outcome: Progressing   Problem: Metabolic: Goal: Ability to maintain appropriate glucose levels will improve Outcome: Progressing   Problem: Skin Integrity: Goal: Risk for impaired skin integrity will decrease Outcome: Progressing   Problem: Tissue Perfusion: Goal: Adequacy of tissue perfusion will improve Outcome: Progressing   Problem: Education: Goal: Knowledge of General Education information will improve Description: Including pain rating scale, medication(s)/side effects and non-pharmacologic comfort measures Outcome: Progressing   Problem: Health Behavior/Discharge Planning: Goal: Ability to manage health-related needs will improve Outcome: Progressing   Problem: Clinical Measurements: Goal: Ability to maintain clinical measurements within normal limits will improve Outcome: Progressing Goal: Will remain free from infection Outcome: Progressing Goal: Diagnostic test results will improve Outcome: Progressing Goal: Respiratory complications will improve Outcome: Progressing Goal: Cardiovascular complication will be avoided Outcome: Progressing   Problem: Activity: Goal: Risk for activity intolerance will decrease Outcome: Progressing   Problem: Nutrition: Goal: Adequate nutrition will be  maintained Outcome: Progressing   Problem: Coping: Goal: Level of anxiety will decrease Outcome: Progressing   Problem: Pain Management: Goal: General experience of comfort will improve Outcome: Progressing   Problem: Safety: Goal: Ability to remain free from injury will improve Outcome: Progressing

## 2023-08-27 NOTE — Anesthesia Preprocedure Evaluation (Signed)
Anesthesia Evaluation  Patient identified by MRN, date of birth, ID band Patient awake    Reviewed: Allergy & Precautions, H&P , NPO status , Patient's Chart, lab work & pertinent test results  Airway Mallampati: II  TM Distance: >3 FB Neck ROM: Full    Dental no notable dental hx. (+) Edentulous Upper, Dental Advisory Given   Pulmonary asthma , sleep apnea , COPD, former smoker   Pulmonary exam normal breath sounds clear to auscultation       Cardiovascular Exercise Tolerance: Good hypertension, Pt. on medications and Pt. on home beta blockers + CAD, + Peripheral Vascular Disease and +CHF   Rhythm:Regular Rate:Normal     Neuro/Psych negative neurological ROS  negative psych ROS   GI/Hepatic Neg liver ROS, hiatal hernia,GERD  Medicated,,  Endo/Other  diabetes, Insulin DependentHypothyroidism  Class 3 obesity  Renal/GU ESRF and DialysisRenal disease  negative genitourinary   Musculoskeletal  (+) Arthritis , Osteoarthritis,    Abdominal   Peds  Hematology  (+) Blood dyscrasia, anemia   Anesthesia Other Findings   Reproductive/Obstetrics negative OB ROS                             Anesthesia Physical Anesthesia Plan  ASA: 3  Anesthesia Plan: MAC   Post-op Pain Management: Minimal or no pain anticipated   Induction: Intravenous  PONV Risk Score and Plan: 2 and Propofol infusion  Airway Management Planned: Natural Airway and Nasal Cannula  Additional Equipment:   Intra-op Plan:   Post-operative Plan:   Informed Consent: I have reviewed the patients History and Physical, chart, labs and discussed the procedure including the risks, benefits and alternatives for the proposed anesthesia with the patient or authorized representative who has indicated his/her understanding and acceptance.     Dental advisory given  Plan Discussed with: CRNA  Anesthesia Plan Comments:         Anesthesia Quick Evaluation

## 2023-08-27 NOTE — H&P (View-Only) (Signed)
[facility] Consult  Referring Provider: [facility] hospitalist/Dr. Susa Raring Primary Care Physician:  Vladimir Crofts, FNP Primary Gastroenterologist: Dr. [facility]  Reason for Consultation: Dysphagia/abnormal barium swallow  HPI: [name] [name] is a 72 y.o. female with past medical history of end-stage renal disease on hemodialysis, Tuesday, Thursday, Saturday, heart failure, COPD on oxygen at home 3 L/min, nonobstructive coronary artery disease, paroxysmal atrial fibrillation on Eliquis, last dose yesterday, small cell lung cancer status post radiation completed 04/06/2022, obstructive sleep apnea intolerant to CPAP, hypertension, diabetes, admitted to the hospital on 08/20/2023 with shortness of breath, being treated for pneumonia.  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 Barium swallow 03/2019 showed dysmotility no stricture. Patient reports difficulty swallowing solids as well as liquids, nonprogressive, present for last several months, not associated with unintentional weight loss.   She denies acid reflux or heartburn. She reports that occasionally stools are dark but has not noted blood in her stool or black stool. Denies abdominal pain.  Prior GI workup: Colonoscopy 2018: Diverticulosis and hemorrhoids, repeat not recommended due to comorbidities Colonoscopy, 2013: Diverticulosis and hemorrhoids Capsule endoscopy 06/2021: Proximal small bowel AVM Enteroscopy 07/2021: AVM treated with APC at 140 cm, 150 cm and 160 cm   Past Medical History:  Diagnosis Date   Anemia    Arthritis    Asthma    Chronic diastolic (congestive) heart failure (HCC)    Chronic kidney disease    Dialysis T-TH-Sat   Chronic pain syndrome    Class 2 obesity due to excess calories with body mass index (BMI) of 36.0 to 36.9 in adult    COPD (chronic obstructive pulmonary disease) (HCC)    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   Full dentures    GERD (gastroesophageal reflux disease)    Gout    History of hiatal hernia    History of transfusion    Hx MRSA infection    abscess left groin   Hyperlipidemia    Hypertension    Hypothyroid    Intractable nausea and vomiting 02/15/2021   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HCC)    Peripheral vascular disease (HCC)    Sleep apnea    UNABLE TO TOLERATE C PAP   Stress incontinence    TIA (transient ischemic attack)     X2 NO RESIDUAL PROBLEMS   Wears glasses     Past Surgical History:  Procedure Laterality Date   ABDOMINAL HYSTERECTOMY     AV FISTULA PLACEMENT Left 12/01/2021   Procedure: INSERTION OF LEFT ARM ARTERIOVENOUS (AV) GORE-TEX GRAFT;  Surgeon: Nada Libman, MD;  Location: [facility] OR;  Service: Vascular;  Laterality: Left;   BACK SURGERY  2020   BASCILIC VEIN TRANSPOSITION Left 04/05/2020   Procedure: BASILIC VEIN TRANSPOSITION FIRST STAGE;  Surgeon: Chuck Hint, MD;  Location: [facility] OR;  Service: Vascular;  Laterality: Left;   BRONCHIAL BIOPSY  01/23/2022   Procedure: BRONCHIAL BIOPSIES;  Surgeon: Josephine Igo, DO;  Location: [facility] ENDOSCOPY;  Service: Pulmonary;;   BRONCHIAL BIOPSY  08/07/2022   Procedure: BRONCHIAL BIOPSIES;  Surgeon: Josephine Igo, DO;  Location: [facility] ENDOSCOPY;  Service: Pulmonary;;   BRONCHIAL BRUSHINGS  08/07/2022   Procedure: BRONCHIAL BRUSHINGS;  Surgeon: Josephine Igo, DO;  Location: [facility] ENDOSCOPY;  Service: Pulmonary;;   BRONCHIAL NEEDLE ASPIRATION BIOPSY  01/23/2022   Procedure: BRONCHIAL NEEDLE ASPIRATION BIOPSIES;  Surgeon: Josephine Igo, DO;  Location: [facility] ENDOSCOPY;  Service: Pulmonary;;   BRONCHIAL NEEDLE ASPIRATION BIOPSY  08/07/2022   Procedure: BRONCHIAL NEEDLE ASPIRATION BIOPSIES;  Surgeon: Josephine Igo, DO;  Location: [facility] ENDOS[facility];  Service: Pulmonary;;   COLON SURGERY  1995   DUE TO POLYP   DILATION AND CURETTAGE OF UTERUS     ENDOBRONCHIAL  ULTRASOUND  01/23/2022   Procedure: ENDOBRONCHIAL ULTRASOUND;  Surgeon: Josephine Igo, DO;  Location: [facility]OS[facility];  Service: Pulmonary;;   ENTEROSCOPY N/A 07/10/2021   Procedure: ENTEROSCOPY;  Surgeon: Kerin Salen, MD;  Location[facility] ENDOSCOPY;  Service: Gastroenterology;  Laterality: N/A;   ESOPHAGOGASTRODUODENOSCOPY N/A 01/25/2021   Procedure: ESOPHAGOGASTRODUODENOSCOPY (EGD);  Surgeon: Vida Rigger, MD;  Location: [facility] ENDOSCOPY;  Service: Endoscopy;  Laterality: N/A;   FINE NEEDLE ASPIRATION  01/23/2022   Procedure: FINE NEEDLE ASPIRATION (FNA) LINEAR;  Surgeon: Josephine Igo, DO;  Location: [facility]OS[facility];  Service: Pulmonary;;   FOREARM FRACTURE SURGERY     Left arm   GIVENS CAPSULE STUDY N/A 07/07/2021   Procedure: GIVENS CAPSULE STUDY;  Surgeon: Vida Rigger, MD;  Location: [facility] ENDOSCOPY;  Service: Endoscopy;  Laterality: N/A;   HERNIA REPAIR     w/ mesh   HOT HEMOSTASIS N/A 07/10/2021   Procedure: HOT HEMOSTASIS (ARGON PLASMA COAGULATION/BICAP);  Surgeon: Kerin Salen, MD;  Location: [facility] ENDOSCOPY;  Service: Gastroenterology;  Laterality: N/A;   INCISION AND DRAINAGE ABSCESS Left 02/04/2013   Procedure: INCISION AND DRAINAGE LEFT BUTTOCK ABSCESS; INCISION AND DRAINAGE LEFT BREAST ABSCESS;  Surgeon: Shelly Rubenstein, MD;  Location: [facility] OR;  Service: General;  Laterality: Left;   INCISION AND DRAINAGE ABSCESS N/A 02/12/2013   Procedure: INCISION AND DEBRIDEMENT BUTTOCK WOUND ;  Surgeon: Wilmon Arms. Corliss Skains, MD;  Location: [facility] OR;  Service: General;  Laterality: N/A;   INCISION AND DRAINAGE ABSCESS N/A 02/14/2013   Procedure: INCISION AND DRAINAGE/DRESSING CHANGE;  Surgeon: Shelly Rubenstein, MD;  Location: [facility] OR;  Service: General;  Laterality: N/A;   INCISION AND DRAINAGE ABSCESS N/A 03/21/2015   Procedure: INCISION AND DRAINAGE PUBIC ABSCESS;  Surgeon: Glenna Fellows, MD;  Location: [facility] ORS;  Service: General;  Laterality: N/A;   INCISION AND DRAINAGE PERIRECTAL ABSCESS Left 02/10/2013   Procedure:  IRRIGATION AND DEBRIDEMENT OF BUTTOCK/PERINEAL ABSCESS;  Surgeon: Wilmon Arms. Corliss Skains, MD;  Location: [facility] OR;  Service: General;  Laterality: Left;   INCISION AND DRAINAGE PERIRECTAL ABSCESS N/A 02/16/2013   Procedure: IRRIGATION AND DEBRIDEMENT PERINEAL ABSCESS;  Surgeon: Liz Malady, MD;  Location: [facility] OR;  Service: General;  Laterality: N/A;   IR FLUORO GUIDE CV LINE RIGHT  11/30/2021   IR FLUORO GUIDE CV LINE RIGHT  08/22/2023   IR US GUIDE VASC ACCESS RIGHT  11/30/2021   IR US GUIDE VASC ACCESS RIGHT  08/22/2023   IRRIGATION AND DEBRIDEMENT ABSCESS N/A 02/06/2013   Procedure: IRRIGATION AND DEBRIDEMENT BUTTOCK ABSCESS AND DRESSING CHANGE;  Surgeon: Shelly Rubenstein, MD;  Location: [facility] OR;  Service: General;  Laterality: N/A;   IRRIGATION AND DEBRIDEMENT ABSCESS Left 02/08/2013   Procedure: IRRIGATION AND DEBRIDEMENT ABSCESS/DRESSING CHANGE;  Surgeon: Cherylynn Ridges, MD;  Location: [facility] OR;  Service: General;  Laterality: Left;   JOINT REPLACEMENT  2010 / 2012    LAPAROSCOPIC CHOLECYSTECTOMY     LEFT HEART CATH AND CORONARY ANGIOGRAPHY N/A 07/05/2023   Procedure: LEFT HEART CATH AND CORONARY ANGIOGRAPHY;  Surgeon: Lennette Bihari, MD;  [facility]: [facility] INVASIVE CV LAB;  Service: Cardiovascular;  Laterality: N/A;   LEFT HEART CATHETERIZATION  WITH CORONARY ANGIOGRAM N/A 07/02/2014   Procedure: LEFT HEART CATHETERIZATION WITH CORONARY ANGIOGRAM;  Surgeon: Runell Gess, MD;  Location: [facility] CATH LAB;  Service: Cardiovascular;  Laterality: N/A;   LOWER EXTREMITY ANGIOGRAM Right 04/20/2016   Procedure: Lower Extremity Angiogram;  Surgeon: Sherren Kerns, MD;  Location: [facility] INVASIVE CV LAB;  Service: Cardiovascular;  Laterality: Right;   MULTIPLE TOOTH EXTRACTIONS     PERIPHERAL VASCULAR CATHETERIZATION N/A 04/20/2016   Procedure: Abdominal Aortogram;  Surgeon: Sherren Kerns, MD;  Location: [facility] INVASIVE CV LAB;  Service: Cardiovascular;  Laterality: N/A;   PERIPHERAL VASCULAR CATHETERIZATION Right 04/20/2016    Procedure: Peripheral Vascular Intervention;  Surgeon: Sherren Kerns, MD;  Location: [facility] INVASIVE CV LAB;  Service: Cardiovascular;  Laterality: Right;  popiteal   RIGHT HEART CATH N/A 10/27/2018   Procedure: RIGHT HEART CATH;  Surgeon: Laurey Morale, MD;  Location: [facility] INVASIVE CV LAB;  Service: Cardiovascular;  Laterality: N/A;   RIGHT HEART CATH N/A 03/20/2019   Procedure: RIGHT HEART CATH;  Surgeon: Laurey Morale, MD;  Location: [facility] INVASIVE CV LAB;  Service: Cardiovascular;  Laterality: N/A;   THYROIDECTOMY     TOTAL HIP REVISION Right 08/11/2014   Procedure: RIGHT ACETABULAR REVISION;  Surgeon: Loanne Drilling, MD;  Location: [facility] ORS;  Service: Orthopedics;  Laterality: Right;   VASCULAR SURGERY     VIDEO BRONCHOSCOPY WITH ENDOBRONCHIAL ULTRASOUND Bilateral 08/07/2022   Procedure: VIDEO BRONCHOSCOPY WITH ENDOBRONCHIAL ULTRASOUND;  Surgeon: Josephine Igo, DO;  Location: [facility] ENDOSCOPY;  Service: Pulmonary;  Laterality: Bilateral;   VIDEO BRONCHOSCOPY WITH RADIAL ENDOBRONCHIAL ULTRASOUND  01/23/2022   Procedure: RADIAL ENDOBRONCHIAL ULTRASOUND;  Surgeon: Josephine Igo, DO;  Location: [facility] ENDOSCOPY;  Service: Pulmonary;;    Prior to Admission medications   Medication Sig Start Date End Date Taking? Authorizing Provider  acetaminophen (TYLENOL) 500 MG tablet Take 1 tablet (500 mg total) by mouth See admin instructions. Take 1,000mg  (2 tablets) by mouth twice a day in addition to 1,000mg  (2 tablets) twice daily as needed for pain. Patient taking differently: Take 1,000 mg by mouth in the morning and at bedtime. 05/14/23  Yes Ghimire, Werner Lean, MD  albuterol (PROVENTIL) (2.5 MG/3ML) 0.083% nebulizer solution Take 3 mLs (2.5 mg total) by nebulization every 6 (six) hours as needed for wheezing or shortness of breath. Patient taking differently: Take 2.5 mg by nebulization 3 (three) times daily. 05/14/23  Yes Ghimire, Werner Lean, MD  albuterol (VENTOLIN HFA) 108 (90 Base) MCG/ACT inhaler Inhale 2  puffs into the lungs every 6 (six) hours as needed for shortness of breath (COPD). 05/14/23  Yes Ghimire, Werner Lean, MD  allopurinol (ZYLOPRIM) 100 MG tablet Take 1 tablet (100 mg total) by mouth daily. 05/14/23  Yes Ghimire, Werner Lean, MD  apixaban (ELIQUIS) 5 MG TABS tablet Take 1 tablet (5 mg total) by mouth 2 (two) times daily. 07/20/23  Yes Ghimire, Werner Lean, MD  budesonide-formoterol (SYMBICORT) 160-4.5 MCG/ACT inhaler Inhale 2 puffs into the lungs in the morning and at bedtime. 05/14/23  Yes Ghimire, Werner Lean, MD  clopidogrel (PLAVIX) 75 MG tablet Take 75 mg by mouth daily.   Yes [provider]  diclofenac Sodium (VOLTAREN) 1 % GEL Apply 2 g topically 4 (four) times daily. Patient taking differently: Apply 1 Application topically daily. 04/17/23  Yes Marlin Canary U, DO  docusate sodium (COLACE) 100 MG capsule Take 1 capsule (100 mg total) by mouth daily. 05/14/23  Yes Ghimire, Werner Lean, MD  fluticasone (FLONASE) 50 MCG/ACT nasal spray Place 1 spray into both nostrils daily. Patient taking differently: Place 2 sprays into both nostrils daily. 05/14/23  Yes Ghimire, Werner Lean, MD  insulin lispro (HUMALOG KWIKPEN) 100 UNIT/ML KwikPen 0-15 Units, Subcutaneous, 3 times daily with meal CBG < 70: Implement Hypoglycemia meausers CBG 70 - 120: 0 units CBG 121 - 150: 2 units CBG 151 - 200: 3 units CBG 201 - 250: 5 units CBG 251 - 300: 8 units CBG 301 - 350: 11 units CBG 351 - 400: 15 units CBG > 400: call MD Patient taking differently: Inject 0-15 Units into the skin daily. 0-15 Units,  CBG < 70: Implement Hypoglycemia meausers CBG 70 - 120: 0 units CBG 121 - 150: 2 units CBG 151 - 200: 3 units CBG 201 - 250: 5 units CBG 251 - 300: 8 units CBG 301 - 350: 11 units CBG 351 - 400: 15 units CBG > 400: call MD 05/14/23  Yes Ghimire, Werner Lean, MD  levothyroxine (SYNTHROID) 175 MCG tablet Take 1 tablet (175 mcg total) by mouth daily. 05/14/23  Yes Ghimire, Werner Lean, MD  lidocaine-prilocaine (EMLA) cream Apply  1 Application topically See admin instructions. On dialysis days (Tues,Thurs,Sat) 02/13/22  Yes [provider]  meclizine (ANTIVERT) 25 MG tablet Take 25 mg by mouth as needed for dizziness or nausea. 06/12/23  Yes [provider]  metoprolol tartrate (LOPRESSOR) 25 MG tablet Take 1.5 tablets (37.5 mg total) by mouth 2 (two) times daily. 07/20/23  Yes Ghimire, Werner Lean, MD  midodrine (PROAMATINE) 5 MG tablet Take 3 tablets (15 mg total) by mouth every 8 (eight) hours. 07/20/23  Yes Ghimire, Werner Lean, MD  Multiple Vitamins-Iron (MULTIVITAMINS WITH IRON) TABS tablet Take 1 tablet by mouth in the morning. 05/14/23  Yes Ghimire, Werner Lean, MD  nitroGLYCERIN (NITROSTAT) 0.4 MG SL tablet Place 1 tablet (0.4 mg total) under the tongue every 5 (five) minutes as needed for chest pain. 07/06/23 07/05/24 Yes Cyndi Bender, NP  ondansetron (ZOFRAN-ODT) 4 MG disintegrating tablet Take 2 mg by mouth every 6 (six) hours as needed for nausea or vomiting. 07/10/23  Yes [provider]  pantoprazole (PROTONIX) 40 MG tablet Take 1 tablet (40 mg total) by mouth 2 (two) times daily before a meal. Patient taking differently: Take 40 mg by mouth daily. 05/14/23  Yes Ghimire, Werner Lean, MD  rosuvastatin (CRESTOR) 20 MG tablet Take 1 tablet (20 mg total) by mouth daily. 07/06/23  Yes Cyndi Bender, NP  sevelamer carbonate (RENVELA) 800 MG tablet Take 2 tablets (1,600 mg total) by mouth 3 (three) times daily with meals. 05/14/23  Yes Ghimire, Werner Lean, MD  thiamine (VITAMIN B1) 100 MG tablet Take 1 tablet (100 mg total) by mouth in the morning. 05/14/23  Yes Ghimire, Werner Lean, MD  amiodarone (PACERONE) 200 MG tablet Take 2 tablets (400 mg total) by mouth 2 (two) times daily for 7 days, THEN 2 tablets (400 mg total) daily for 7 days, THEN 1 tablet (200 mg total) daily. Patient not taking: Reported on 08/20/2023 07/20/23 09/20/23  Maretta Bees, MD  lidocaine 4 % Place 2 patches onto the skin daily. Each lower side  of back. Patient not taking: Reported on 08/20/2023 05/14/23   Maretta Bees, MD  Methoxy PEG-Epoetin Beta (MIRCERA IJ) Inject 1 Syringe into the skin every 14 (fourteen) days. 06/01/23 05/30/24  [provider]  OXYGEN Inhale into the lungs.    [provider]  pregabalin (  LYRICA) 25 MG capsule Take 25 mg by mouth daily. Patient not taking: Reported on 08/20/2023 05/29/23   [provider]  umeclidinium bromide (INCRUSE ELLIPTA) 62.5 MCG/ACT AEPB Inhale 1 puff into the lungs daily. Patient not taking: Reported on 08/20/2023 05/14/23   Maretta Bees, MD    Current Facility-Administered Medications  Medication Dose Route Frequency Provider Last Rate Last Admin   acetaminophen (TYLENOL) tablet 650 mg  650 mg Oral Q6H PRN Dahal, Melina Schools, MD   650 mg at 08/27/23 1249   Or   acetaminophen (TYLENOL) suppository 650 mg  650 mg Rectal Q6H PRN Dahal, Melina Schools, MD       albuterol (PROVENTIL) (2.5 MG/3ML) 0.083% nebulizer solution 2.5 mg  2.5 mg Nebulization Q2H PRN Ghimire, Werner Lean, MD       allopurinol (ZYLOPRIM) tablet 100 mg  100 mg Oral Daily Dahal, Binaya, MD   100 mg at 08/27/23 0844   arformoterol (BROVANA) nebulizer solution 15 mcg  15 mcg Nebulization BID Maretta Bees, MD   15 mcg at 08/27/23 0756   budesonide (PULMICORT) nebulizer solution 0.5 mg  0.5 mg Nebulization BID Lidia Collum, PA-C   0.5 mg at 08/27/23 0757   Chlorhexidine Gluconate Cloth 2 % PADS 6 each  6 each Topical Q0600 Delano Metz, MD   6 each at 08/27/23 0617   Chlorhexidine Gluconate Cloth 2 % PADS 6 each  6 each Topical Q0600 Delano Metz, MD   6 each at 08/27/23 0617   colchicine tablet 0.3 mg  0.3 mg Oral BID Leroy Sea, MD   0.3 mg at 08/27/23 1019   diclofenac Sodium (VOLTAREN) 1 % topical gel 2 g  2 g Topical QID Leroy Sea, MD   2 g at 08/27/23 0854   heparin ADULT infusion 100 units/mL (25000 units/279mL)  1,150 Units/hr Intravenous Continuous Kerin Salen, MD        hydrALAZINE (APRESOLINE) injection 10 mg  10 mg Intravenous Q6H PRN Dahal, Melina Schools, MD       hydrOXYzine (ATARAX) 10 MG/5ML syrup 10 mg  10 mg Oral TID PRN Leroy Sea, MD   10 mg at 08/27/23 0346   insulin aspart (novoLOG) injection 0-5 Units  0-5 Units Subcutaneous QHS Lorin Glass, MD   2 Units at 08/26/23 2116   insulin aspart (novoLOG) injection 0-9 Units  0-9 Units Subcutaneous TID WC Lorin Glass, MD   2 Units at 08/27/23 1249   leptospermum manuka honey (MEDIHONEY) paste 1 Application  1 Application Topical Daily Dahal, Melina Schools, MD   1 Application at 08/27/23 1250   levothyroxine (SYNTHROID) tablet 175 mcg  175 mcg Oral Q0600 Lorin Glass, MD   175 mcg at 08/27/23 0541   meclizine (ANTIVERT) tablet 25 mg  25 mg Oral PRN Dahal, Melina Schools, MD       metoprolol tartrate (LOPRESSOR) tablet 25 mg  25 mg Oral BID Leroy Sea, MD   25 mg at 08/27/23 0843   midodrine (PROAMATINE) tablet 10 mg  10 mg Oral Q8H Dahal, Binaya, MD   10 mg at 08/27/23 0541   pantoprazole (PROTONIX) EC tablet 40 mg  40 mg Oral BID AC Dahal, Melina Schools, MD   40 mg at 08/27/23 0843   revefenacin (YUPELRI) nebulizer solution 175 mcg  175 mcg Nebulization Daily Maretta Bees, MD   175 mcg at 08/27/23 0757   rosuvastatin (CRESTOR) tablet 20 mg  20 mg Oral Daily Dahal, Melina Schools, MD   20 mg at  08/27/23 0844   sevelamer carbonate (RENVELA) tablet 1,600 mg  1,600 mg Oral TID WC Dahal, Melina Schools, MD   1,600 mg at 08/27/23 1249   sodium chloride flush (NS) 0.9 % injection 10-40 mL  10-40 mL Intracatheter Q12H Ghimire, Werner Lean, MD   10 mL at 08/27/23 1019    Allergies as of 08/20/2023 - Review Complete 08/20/2023  Allergen Reaction Noted   Nebivolol Swelling 04/17/2015   Zestril [lisinopril] Swelling and Other (See Comments) 07/11/2022   Ace inhibitors Swelling and Other (See Comments) 11/21/2010   Morphine and codeine Itching 11/21/2010    Family History  Problem Relation Age of Onset   Diabetes Brother     Cardiomyopathy Mother    Heart disease Mother    Cancer Father     Social History   Socioeconomic History   Marital status: Legally Separated    Spouse name: Not on file   Number of children: 1   Years of education: college   Highest education level: Not on file  Occupational History    Comment: Disabled  Tobacco Use   Smoking status: Former    Current packs/day: 0.00    Average packs/day: 0.3 packs/day for 40.0 years (10.0 ttl pk-yrs)    Types: Cigarettes    Start date: 10/1981    Quit date: 10/2021    Years since quitting: 1.8    Passive exposure: Past   Smokeless tobacco: Former  Building services engineer status: Never Used  Substance and Sexual Activity   Alcohol use: Yes    Alcohol/week: 1.0 standard drink of alcohol    Types: 1 Standard drinks or equivalent per week    Comment: OCC   Drug use: No   Sexual activity: Not Currently    Birth control/protection: Surgical    Comment: Hysterectomy  Other Topics Concern   Not on file  Social History Narrative   ** Merged History Encounter **       Patient is separated  and lives at home with her friend. Patient is disabled. Education one year of college Right handed Caffeine two cups daily.     Social Determinants of Health   Financial Resource Strain: Not on file  Food Insecurity: No Food Insecurity (08/20/2023)   Hunger Vital Sign    Worried About Running Out of Food in the Last Year: Never true    Ran Out of Food in the Last Year: Never true  Transportation Needs: No Transportation Needs (08/20/2023)   PRAPARE - Administrator, Civil Service (Medical): No    Lack of Transportation (Non-Medical): No  Physical Activity: Not on file  Stress: Not on file  Social Connections: Not on file  Intimate Partner Violence: Not At Risk (08/20/2023)   Humiliation, Afraid, Rape, and Kick questionnaire    Fear of Current or Ex-Partner: No    Emotionally Abused: No    Physically Abused: No    Sexually Abused: No     Review of Systems: Occasional nosebleeds, otherwise as per HPI  Physical Exam: Vital signs in last 24 hours: Temp:  [98.5 F (36.9 C)-99.2 F (37.3 C)] 98.9 F (37.2 C) (11/19 1120) Pulse Rate:  [66-75] 66 (11/19 1120) Resp:  [11-20] 11 (11/19 1120) BP: (93-142)/(38-74) 126/47 (11/19 1120) SpO2:  [97 %-100 %] 100 % (11/19 0800) FiO2 (%):  [40 %] 40 % (11/18 2239)    General:   Overweight  Head:  Normocephalic and atraumatic. Eyes:  Sclera clear, no icterus.  Mild pallor  Ears:  Normal auditory acuity. Nose:  No deformity, discharge,  or lesions. Mouth:  No deformity or lesions.  Oropharynx pink & moist. Neck:  Supple; no masses or thyromegaly. Lungs:  Clear throughout to auscultation.   No wheezes, crackles, or rhonchi. No acute distress. Heart: Hemodialysis catheter over right chest, regular rate and rhythm; no murmurs, clicks, rubs,  or gallops. Extremities:  Without clubbing or edema. Neurologic:  Alert and  oriented x4;  grossly normal neurologically. Skin:  Intact without significant lesions or rashes. Psych:  Alert and cooperative. Normal mood and affect. Abdomen:  Soft, nontender and nondistended. No masses, hepatosplenomegaly or hernias noted. Normal bowel sounds, without guarding, and without rebound.         Lab Results: Recent Labs    08/25/23 0310 08/26/23 0900  WBC 8.4 8.4  HGB 8.2* 7.9*  HCT 28.0* 27.3*  PLT 197 207   BMET Recent Labs    08/25/23 0310 08/26/23 0900  NA 135 137  K 3.6 3.7  CL 96* 98  CO2 29 27  GLUCOSE 153* 132*  BUN 29* 48*  CREATININE 4.24* 6.29*  CALCIUM 9.0 9.2   LFT Recent Labs    08/26/23 0900  ALBUMIN 2.8*   PT/INR No results for input(s): "LABPROT", "INR" in the last 72 hours.  Studies/Results: DG Ankle Complete Left  Result Date: 08/26/2023 CLINICAL DATA:  Pain in anterior ankle EXAM: LEFT ANKLE COMPLETE - 3+ VIEW COMPARISON:  04/12/2021 FINDINGS: Frontal, oblique, lateral views of the left ankle are  obtained. The bones are severely osteopenic. No acute fracture, subluxation, or dislocation. Osteoarthritis of the ankle and hindfoot. Mild diffuse subcutaneous edema. Diffuse vascular calcifications. IMPRESSION: 1. Osteopenia.  No acute fracture. 2. Osteoarthritis of the ankle and hindfoot. 3. Diffuse subcutaneous edema. Electronically Signed   By: Sharlet Salina M.D.   On: 08/26/2023 21:37   DG ESOPHAGUS W SINGLE CM (SOL OR THIN BA)  Result Date: 08/26/2023 CLINICAL DATA:  Dysphagia with concern for stricture. EXAM: ESOPHAGUS/BARIUM SWALLOW/TABLET STUDY TECHNIQUE: Single contrast examination was performed using thin liquid barium. This exam was performed by Corrin Parker, PA-C, and was supervised and interpreted by Dr. Leanna Battles. FLUOROSCOPY: Radiation Exposure Index (as provided by the fluoroscopic device): 29.0 mGy Kerma COMPARISON:  None Available. FINDINGS: Swallowing: Appears normal. No vestibular penetration or aspiration seen. Pharynx: Unremarkable. Esophagus: No esophageal fold thickening. There is a stricture at the gastroesophageal junction. Esophageal motility: Present. Hiatal Hernia: None. Gastroesophageal reflux: None. Ingested 13mm barium tablet: Not given due to stricture. Other: Exam limited secondary to patient's immobility. 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 Associated dysmotility. Electronically Signed   By: Leanna Battles M.D.   On: 08/26/2023 16:48    Impression: Dysphagia, abnormal barium swallow concerning for high-grade stricture at GE junction   End-stage renal disease on hemodialysis, hemoglobin 7.9, elevated MCV 107.1, BUN 48, creatinine 6.29  Other comorbidities: Pneumonia, pulmonary edema, COPD, obstructive sleep apnea, heart failure, gout, hypothyroidism, history of lung cancer status post radiation, osteoarthritis, debility/deconditioning  Atrial fibrillation, was on Eliquis, last dose yesterday, last dose of Plavix  08/23/2023  Plan: Patient has been switched to heparin. EGD with possible balloon dilation scheduled for 9 AM tomorrow. Will order for heparin to be held at 3 AM. The risks and the benefits of the procedure were discussed with the patient in details. She understands and verbalizes consent.   LOS: 7 days   Kerin Salen, MD  08/27/2023, 1:57 PM

## 2023-08-28 ENCOUNTER — Encounter (HOSPITAL_COMMUNITY)
Admission: EM | Disposition: A | Discharge status: Home Health | Payer: Self-pay | Source: Home / Self Care | Attending: Internal Medicine

## 2023-08-28 ENCOUNTER — Inpatient Hospital Stay (HOSPITAL_COMMUNITY): Payer: 59 | Admitting: Anesthesiology

## 2023-08-28 ENCOUNTER — Encounter (HOSPITAL_COMMUNITY): Payer: Self-pay | Admitting: Internal Medicine

## 2023-08-28 DIAGNOSIS — K3189 Other diseases of stomach and duodenum: Secondary | ICD-10-CM

## 2023-08-28 DIAGNOSIS — J9602 Acute respiratory failure with hypercapnia: Secondary | ICD-10-CM | POA: Diagnosis not present

## 2023-08-28 HISTORY — PX: ESOPHAGEAL DILATION: SHX303

## 2023-08-28 HISTORY — PX: ESOPHAGOGASTRODUODENOSCOPY (EGD) WITH PROPOFOL: SHX5813

## 2023-08-28 HISTORY — PX: BOTOX INJECTION: SHX5754

## 2023-08-28 HISTORY — PX: BIOPSY: SHX5522

## 2023-08-28 LAB — CBC
HCT: 26.7 % — ABNORMAL LOW (ref 36.0–46.0)
Hemoglobin: 7.8 g/dL — ABNORMAL LOW (ref 12.0–15.0)
MCH: 30.7 pg (ref 26.0–34.0)
MCHC: 29.2 g/dL — ABNORMAL LOW (ref 30.0–36.0)
MCV: 105.1 fL — ABNORMAL HIGH (ref 80.0–100.0)
Platelets: 242 10*3/uL (ref 150–400)
RBC: 2.54 MIL/uL — ABNORMAL LOW (ref 3.87–5.11)
RDW: 15.3 % (ref 11.5–15.5)
WBC: 9.3 10*3/uL (ref 4.0–10.5)
nRBC: 0.9 % — ABNORMAL HIGH (ref 0.0–0.2)

## 2023-08-28 LAB — GLUCOSE, CAPILLARY
Glucose-Capillary: 143 mg/dL — ABNORMAL HIGH (ref 70–99)
Glucose-Capillary: 107 mg/dL — ABNORMAL HIGH (ref 70–99)
Glucose-Capillary: 158 mg/dL — ABNORMAL HIGH (ref 70–99)
Glucose-Capillary: 123 mg/dL — ABNORMAL HIGH (ref 70–99)
Glucose-Capillary: 169 mg/dL — ABNORMAL HIGH (ref 70–99)

## 2023-08-28 LAB — BASIC METABOLIC PANEL
Anion gap: 12 (ref 5–15)
BUN: 56 mg/dL — ABNORMAL HIGH (ref 8–23)
CO2: 27 mmol/L (ref 22–32)
Calcium: 9.9 mg/dL (ref 8.9–10.3)
Chloride: 94 mmol/L — ABNORMAL LOW (ref 98–111)
Creatinine, Ser: 6.87 mg/dL — ABNORMAL HIGH (ref 0.44–1.00)
GFR, Estimated: 6 mL/min — ABNORMAL LOW (ref >60)
Glucose, Bld: 150 mg/dL — ABNORMAL HIGH (ref 70–99)
Potassium: 4.2 mmol/L (ref 3.5–5.1)
Sodium: 133 mmol/L — ABNORMAL LOW (ref 135–145)

## 2023-08-28 LAB — C-REACTIVE PROTEIN: CRP: 1.6 mg/dL — ABNORMAL HIGH (ref <1.0)

## 2023-08-28 LAB — HEPARIN LEVEL (UNFRACTIONATED): Heparin Unfractionated: >1.1 [IU]/mL — ABNORMAL HIGH (ref 0.30–0.70)

## 2023-08-28 LAB — APTT: aPTT: 100 s — ABNORMAL HIGH (ref 24–36)

## 2023-08-28 LAB — URIC ACID: Uric Acid, Serum: 4.6 mg/dL (ref 2.5–7.1)

## 2023-08-28 SURGERY — ESOPHAGOGASTRODUODENOSCOPY (EGD) WITH PROPOFOL
Anesthesia: Monitor Anesthesia Care

## 2023-08-28 MED ORDER — LIDOCAINE 2% (20 MG/ML) 5 ML SYRINGE
INTRAMUSCULAR | Status: DC | PRN
  Administered 2023-08-28: 100 mg via INTRAVENOUS

## 2023-08-28 MED ORDER — SODIUM CHLORIDE 0.9 % IV SOLN: INTRAVENOUS | Status: DC | PRN

## 2023-08-28 MED ORDER — CHLORHEXIDINE GLUCONATE CLOTH 2 % EX PADS
6.0000 | MEDICATED_PAD | Freq: Every day | CUTANEOUS | Status: DC
  Administered 2023-08-29 – 2023-08-30 (×2): 6 via TOPICAL

## 2023-08-28 MED ORDER — PROPOFOL 10 MG/ML IV BOLUS
INTRAVENOUS | Status: DC | PRN
  Administered 2023-08-28 (×9): 10 mg via INTRAVENOUS
  Administered 2023-08-28: 50 mg via INTRAVENOUS
  Administered 2023-08-28 (×4): 10 mg via INTRAVENOUS

## 2023-08-28 MED ORDER — SODIUM CHLORIDE (PF) 0.9 % IJ SOLN
INTRAMUSCULAR | Status: AC
  Filled 2023-08-28: qty 10

## 2023-08-28 MED ORDER — PHENYLEPHRINE HCL (PRESSORS) 10 MG/ML IV SOLN
INTRAVENOUS | Status: DC | PRN
  Administered 2023-08-28 (×3): 80 ug via INTRAVENOUS
  Administered 2023-08-28: 160 ug via INTRAVENOUS
  Administered 2023-08-28 (×2): 80 ug via INTRAVENOUS

## 2023-08-28 MED ORDER — ONABOTULINUMTOXINA 100 UNITS IJ SOLR
100.0000 [IU] | Freq: Once | INTRAMUSCULAR | Status: AC
  Administered 2023-08-28: 100 [IU] via INTRAMUSCULAR
  Filled 2023-08-28: qty 100

## 2023-08-28 MED ORDER — APIXABAN 5 MG PO TABS
5.0000 mg | ORAL_TABLET | Freq: Two times a day (BID) | ORAL | Status: DC
  Administered 2023-08-28 – 2023-08-30 (×4): 5 mg via ORAL
  Filled 2023-08-28 (×4): qty 1

## 2023-08-28 MED ORDER — EPHEDRINE SULFATE (PRESSORS) 50 MG/ML IJ SOLN
INTRAMUSCULAR | Status: DC | PRN
  Administered 2023-08-28: 5 mg via INTRAVENOUS

## 2023-08-28 MED ORDER — COLCHICINE 0.3 MG HALF TABLET
0.3000 mg | ORAL_TABLET | Freq: Every day | ORAL | Status: DC
  Administered 2023-08-29 – 2023-08-30 (×2): 0.3 mg via ORAL
  Filled 2023-08-28 (×2): qty 1

## 2023-08-28 SURGICAL SUPPLY — 14 items

## 2023-08-28 NOTE — Progress Notes (Signed)
Ok to reduce colchicine to 0.3mg  qday for now for acute gout per Dr. Thedore Mins.  Ulyses Southward, PharmD, BCIDP, AAHIVP, CPP Infectious Disease Pharmacist 08/28/2023 1:14 PM

## 2023-08-28 NOTE — Anesthesia Postprocedure Evaluation (Signed)
Anesthesia Post Note  Patient: KAYLEANA STEPNEY  Procedure(s) Performed: ESOPHAGOGASTRODUODENOSCOPY (EGD) WITH PROPOFOL BIOPSY ESOPHAGEAL BALLOON  DILATION BOTOX INJECTION     Patient location during evaluation: Endoscopy Anesthesia Type: MAC Level of consciousness: awake and alert Pain management: pain level controlled Vital Signs Assessment: post-procedure vital signs reviewed and stable Respiratory status: spontaneous breathing, nonlabored ventilation, respiratory function stable and patient connected to nasal cannula oxygen Cardiovascular status: stable and blood pressure returned to baseline Postop Assessment: no apparent nausea or vomiting Anesthetic complications: no  No notable events documented.  Last Vitals:  Vitals:   08/28/23 0930 08/28/23 0940  BP: 135/62 (!) 144/60  Pulse: 65 66  Resp: (!) 22 (!) 22  Temp:    SpO2: 98% 98%    Last Pain:  Vitals:   08/28/23 0925  TempSrc: Temporal  PainSc: 0-No pain                 Daleon Willinger,W. EDMOND

## 2023-08-28 NOTE — Progress Notes (Signed)
   08/28/23 0048  BiPAP/CPAP/SIPAP  $ Non-Invasive Ventilator  Non-Invasive Vent Subsequent  BiPAP/CPAP/SIPAP Pt Type Adult  BiPAP/CPAP/SIPAP SERVO  Mask Type Full face mask  Mask Size Medium  Set Rate 22 breaths/min  Respiratory Rate 25 breaths/min  IPAP 17 cmH20  EPAP 5 cmH2O  FiO2 (%) 40 %  Minute Ventilation 17.7  Leak 58  Peak Inspiratory Pressure (PIP) 19  Tidal Volume (Vt) 506  Patient Home Equipment No  Auto Titrate No   RT was able to place pt on bipap

## 2023-08-28 NOTE — Progress Notes (Signed)
   08/28/23 0015  BiPAP/CPAP/SIPAP  Reason BIPAP/CPAP not in use (S)  Non-compliant (pt refusing bipap tonight)   RT has attempted twice to place pt on bipap.  Pt  has refused both times.  Sleeping with a nasal cannula on.  Rt will continue to monitor

## 2023-08-28 NOTE — Progress Notes (Signed)
PHARMACY - ANTICOAGULATION CONSULT NOTE  Pharmacy Consult for heparin>>apixaban Indication: atrial fibrillation  Allergies  Allergen Reactions   Nebivolol Swelling    Chest pain (reaction to Bystolic)   Zestril [Lisinopril] Swelling and Other (See Comments)   Ace Inhibitors Swelling and Other (See Comments)    Tongue swell   Morphine And Codeine Itching    Patient Measurements: Height: 5\' 3"  (160 cm) Weight: 96.7 kg (213 lb 3.2 oz) IBW/kg (Calculated) : 52.4 Heparin Dosing Weight: 76kg  Vital Signs: Temp: 97.9 F (36.6 C) (11/20 0925) Temp Source: Oral (11/20 1025) BP: 139/58 (11/20 1025) Pulse Rate: 64 (11/20 1025)  Labs: Recent Labs    08/26/23 0900 08/28/23 0358 08/28/23 0559  HGB 7.9* 7.8*  --   HCT 27.3* 26.7*  --   PLT 207 242  --   APTT  --  100*  --   HEPARINUNFRC  --  >1.10*  --   CREATININE 6.29*  --  6.87*    Estimated Creatinine Clearance: 8.2 mL/min (A) (by C-G formula based on SCr of 6.87 mg/dL (H)).   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   Full dentures    GERD (gastroesophageal reflux disease)    Gout    History of hiatal hernia    History of transfusion    Hx MRSA infection    abscess left groin   Hyperlipidemia    Hypertension    Hypothyroid    Intractable nausea and vomiting 02/15/2021   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HCC)    Peripheral vascular disease (HCC)    Sleep apnea    UNABLE TO TOLERATE C PAP   Stress incontinence    TIA (transient ischemic attack)     X2 NO RESIDUAL PROBLEMS   Wears glasses     Medications:   Medications Prior to Admission  Medication Sig Dispense Refill Last Dose   acetaminophen (TYLENOL) 500 MG tablet Take 1 tablet (500 mg total) by mouth See admin instructions. Take 1,000mg  (2 tablets) by mouth twice a day in addition to 1,000mg  (2 tablets) twice daily as needed for pain. (Patient taking differently: Take 1,000 mg by mouth in the morning and at bedtime.)   08/19/2023   albuterol (PROVENTIL) (2.5 MG/3ML) 0.083% nebulizer solution Take 3 mLs (2.5 mg total) by nebulization every 6 (six) hours as needed for wheezing or shortness of breath. (Patient taking differently: Take 2.5 mg by nebulization 3 (three) times daily.) 90 mL 1 08/19/2023   albuterol (VENTOLIN HFA) 108 (90 Base) MCG/ACT inhaler Inhale 2 puffs into the lungs every 6 (six) hours as needed for shortness of breath (COPD). 18 g 1 Unk   allopurinol (ZYLOPRIM) 100 MG tablet Take 1 tablet (100 mg total) by mouth daily. 30 tablet 1 08/19/2023   apixaban (ELIQUIS) 5 MG TABS tablet Take 1 tablet (5 mg total) by mouth 2 (two) times daily. 60 tablet 2 08/19/2023 at PM   budesonide-formoterol (SYMBICORT) 160-4.5 MCG/ACT inhaler Inhale 2 puffs into the lungs in the morning and at  bedtime. 10.2 g 12 08/19/2023   clopidogrel (PLAVIX) 75 MG tablet Take 75 mg by mouth daily.   08/19/2023   diclofenac Sodium (VOLTAREN) 1 % GEL Apply 2 g topically 4 (four) times daily. (Patient taking differently: Apply 1 Application topically daily.)   08/19/2023   docusate sodium (COLACE) 100 MG capsule Take 1 capsule (100 mg total) by mouth daily. 100 capsule 0 08/19/2023   fluticasone (FLONASE) 50 MCG/ACT nasal spray Place 1 spray into both nostrils daily. (Patient taking differently: Place 2 sprays into both nostrils daily.) 16 g 2 Unk   insulin lispro (HUMALOG KWIKPEN) 100 UNIT/ML KwikPen 0-15 Units, Subcutaneous, 3 times daily with meal CBG < 70: Implement Hypoglycemia meausers CBG 70 - 120: 0 units CBG 121 - 150: 2 units CBG 151 - 200: 3 units CBG 201 -  250: 5 units CBG 251 - 300: 8 units CBG 301 - 350: 11 units CBG 351 - 400: 15 units CBG > 400: call MD (Patient taking differently: Inject 0-15 Units into the skin daily. 0-15 Units,  CBG < 70: Implement Hypoglycemia meausers CBG 70 - 120: 0 units CBG 121 - 150: 2 units CBG 151 - 200: 3 units CBG 201 - 250: 5 units CBG 251 - 300: 8 units CBG 301 - 350: 11 units CBG 351 - 400: 15 units CBG > 400: call MD) 15 mL 11 08/19/2023   levothyroxine (SYNTHROID) 175 MCG tablet Take 1 tablet (175 mcg total) by mouth daily. 30 tablet 3 08/19/2023   lidocaine-prilocaine (EMLA) cream Apply 1 Application topically See admin instructions. On dialysis days (Tues,Thurs,Sat)   Unk   meclizine (ANTIVERT) 25 MG tablet Take 25 mg by mouth as needed for dizziness or nausea.   Unk   metoprolol tartrate (LOPRESSOR) 25 MG tablet Take 1.5 tablets (37.5 mg total) by mouth 2 (two) times daily. 90 tablet 1 08/19/2023   midodrine (PROAMATINE) 5 MG tablet Take 3 tablets (15 mg total) by mouth every 8 (eight) hours. 270 tablet 1 08/19/2023   Multiple Vitamins-Iron (MULTIVITAMINS WITH IRON) TABS tablet Take 1 tablet by mouth in the morning. 30 tablet 1 08/19/2023   nitroGLYCERIN (NITROSTAT) 0.4 MG SL tablet Place 1 tablet (0.4 mg total) under the tongue every 5 (five) minutes as needed for chest pain. 20 tablet 2 Unk   ondansetron (ZOFRAN-ODT) 4 MG disintegrating tablet Take 2 mg by mouth every 6 (six) hours as needed for nausea or vomiting.   Unk   pantoprazole (PROTONIX) 40 MG tablet Take 1 tablet (40 mg total) by mouth 2 (two) times daily before a meal. (Patient taking differently: Take 40 mg by mouth daily.) 60 tablet 2 08/19/2023   rosuvastatin (CRESTOR) 20 MG tablet Take 1 tablet (20 mg total) by mouth daily. 30 tablet 2 08/19/2023   sevelamer carbonate (RENVELA) 800 MG tablet Take 2 tablets (1,600 mg total) by mouth 3 (three) times daily with meals. 180 tablet 2 08/19/2023   thiamine (VITAMIN B1) 100 MG tablet Take 1 tablet (100  mg total) by mouth in the morning. 30 tablet 2 08/19/2023   amiodarone (PACERONE) 200 MG tablet Take 2 tablets (400 mg total) by mouth 2 (two) times daily for 7 days, THEN 2 tablets (400 mg total) daily for 7 days, THEN 1 tablet (200 mg total) daily. (Patient not taking: Reported on 08/20/2023) 90 tablet 2 Not Taking   lidocaine 4 % Place 2 patches onto the skin daily. Each lower side of back. (Patient not taking: Reported on  08/20/2023) 60 patch 1 Not Taking   Methoxy PEG-Epoetin Beta (MIRCERA IJ) Inject 1 Syringe into the skin every 14 (fourteen) days.      OXYGEN Inhale into the lungs.      pregabalin (LYRICA) 25 MG capsule Take 25 mg by mouth daily. (Patient not taking: Reported on 08/20/2023)   Not Taking   umeclidinium bromide (INCRUSE ELLIPTA) 62.5 MCG/ACT AEPB Inhale 1 puff into the lungs daily. (Patient not taking: Reported on 08/20/2023) 30 each 3 Not Taking   Scheduled:   apixaban  5 mg Oral BID   arformoterol  15 mcg Nebulization BID   budesonide (PULMICORT) nebulizer solution  0.5 mg Nebulization BID   Chlorhexidine Gluconate Cloth  6 each Topical Q0600   Chlorhexidine Gluconate Cloth  6 each Topical Q0600   colchicine  0.3 mg Oral BID   diclofenac Sodium  2 g Topical QID   insulin aspart  0-5 Units Subcutaneous QHS   insulin aspart  0-9 Units Subcutaneous TID WC   leptospermum manuka honey  1 Application Topical Daily   levothyroxine  175 mcg Oral Q0600   metoprolol tartrate  25 mg Oral BID   midodrine  10 mg Oral Q8H   pantoprazole  40 mg Oral BID AC   revefenacin  175 mcg Nebulization Daily   rosuvastatin  20 mg Oral Daily   sevelamer carbonate  1,600 mg Oral TID WC   sodium chloride flush  10-40 mL Intracatheter Q12H    Assessment: Pt has been on apixaban for AF. Due to esophageal stricture, heparin will be used to bridge pending GI eval. We will use APTT for now to monitor.   S/p EGD with dilatation. Ok to resume apixaban tonight per Dr Thedore Mins.  Hgb ~8, plt  wnl  Goal of Therapy:  Monitor platelets by anticoagulation protocol: Yes   Plan:  Resume apixaban 5mg  BID at 9PM   Ulyses Southward, PharmD, BCIDP, AAHIVP, CPP Infectious Disease Pharmacist 08/28/2023 10:41 AM

## 2023-08-28 NOTE — Transfer of Care (Signed)
Immediate Anesthesia Transfer of Care Note  Patient: [name] [name]  Procedure(s) Performed: ESOPHAGOGASTRODUODENOSCOPY (EGD) WITH PROPOFOL BIOPSY ESOPHAGEAL BALLOON  DILATION BOTOX INJECTION  Patient Location: Endoscopy Unit  Anesthesia Type:MAC  Level of Consciousness: awake, alert , and oriented  Airway & Oxygen Therapy: Patient Spontanous Breathing and Patient connected to nasal cannula oxygen  Post-op Assessment: Report given to RN and Post -op Vital signs reviewed and stable  Post vital signs: Reviewed and stable  Last Vitals:  Vitals Value Taken Time  BP    Temp    Pulse 64 08/28/23 0925  Resp 21 08/28/23 0925  SpO2 97 % 08/28/23 0925  Vitals shown include unfiled device data.  Last Pain:  Vitals:   08/28/23 0817  TempSrc: Tympanic  PainSc: 0-No pain      Patients Stated Pain Goal: 0 (08/27/23 2209)  Complications: No notable events documented.

## 2023-08-28 NOTE — Progress Notes (Signed)
PT Cancellation Note  Patient Details Name: [name] [name] MRN: [id number] DOB: [birth year]   Cancelled Treatment:    Reason Eval/Treat Not Completed: Patient at procedure or test/unavailable (Pt off the floor at endoscopy. Will follow up if time allows.)   [name] 08/28/2023, 9:44 AM

## 2023-08-28 NOTE — Plan of Care (Signed)
  Problem: Coping: Goal: Ability to adjust to condition or change in health will improve Outcome: Progressing   Problem: Fluid Volume: Goal: Ability to maintain a balanced intake and output will improve Outcome: Progressing   Problem: Health Behavior/Discharge Planning: Goal: Ability to identify and utilize available resources and services will improve Outcome: Progressing Goal: Ability to manage health-related needs will improve Outcome: Progressing   Problem: Skin Integrity: Goal: Risk for impaired skin integrity will decrease Outcome: Progressing   Problem: Pain Management: Goal: General experience of comfort will improve Outcome: Progressing   Problem: Safety: Goal: Ability to remain free from injury will improve Outcome: Progressing

## 2023-08-28 NOTE — Progress Notes (Signed)
[facility] Progress Note  Subjective: seen in room. No c/o's, on 3 L O2.   Vitals:   08/28/23 0925 08/28/23 0930 08/28/23 0940 08/28/23 1025  BP: (!) 127/48 135/62 (!) 144/60 (!) 139/58  Pulse: 64 65 66 64  Resp: (!) 21 (!) 22 (!) 22 20  Temp: 97.9 F (36.6 C)     TempSrc: Temporal   Oral  SpO2: 97% 98% 98%   Weight:      Height:        Exam: Gen alert, not in distress, 3 L Bent No jvd or bruits Chest rare basilar rales, no ^wob RRR no RG Abd soft ntnd no mass or ascites +bs MS no joint effusions or deformity Ext no LE edema Neuro is alert, nf    LUA AVG+bruit           Renal-related home meds: - metoprolol 25 bid - home O2 3 L - eliquis 5 bid - midodrine 15 tid - lyrica 25 qam - renvela 2 ac tid    OP HD: [facility]  4h   400/600   97.5kg  2/2 bath  LUA AVG  Heparin 2000 - last OP HD 11/09, post wt 101.3kg (+4 kg) - coming off 2-4kg over the last 2 wks - hectorol 3 mcg  - mircera 100 mcg IV q 2, last 11/2, due 11/16     Assessment/ Plan: 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 BNP was up at 1345. Has not been getting to dry wt last 2 wks. Had HD 11/12 here w/ 2.5 L UF. Pt decompensated 11/14 and required bipap for poor mental status and severe hypercarbia. CXR 11/14 showed early edema and pt had HD w/ 2.5 L off w/ stable BP's. Post HD the O2 was weaned down to 4 L [place]. Since then has weaned down to 3 L which is what she wears at home. After HD Monday wt's were 2kg under the dry wt.  Volume - early pulm edema by CXR 11/14, improved on repeat CXR's. IV access: very difficult, IR placed a tunneled PICC for IV access.  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 No missed HD. Had HD Monday off schedule. Next HD Thursday.  BP - on midodrine 10 tid for now. 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 Follow.  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 Cont binders.  COPD - on home O2 3 L [place] Dispo - ok for dc from  renal standpoint    [name] MD  [facility] 08/28/2023, 3:02 PM  Recent Labs  Lab 08/25/23 0310 08/26/23 0900 08/28/23 0358 08/28/23 0559  HGB 8.2* 7.9* 7.8*  --   ALBUMIN 3.1* 2.8*  --   --   CALCIUM 9.0 9.2  --  9.9  PHOS 2.9 3.3  --   --   CREATININE 4.24* 6.29*  --  6.87*  K 3.6 3.7  --  4.2   No results for input(s): "IRON", "TIBC", "FERRITIN" in the last 168 hours. Inpatient medications:  apixaban  5 mg Oral BID   arformoterol  15 mcg Nebulization BID   budesonide (PULMICORT) nebulizer solution  0.5 mg Nebulization BID   Chlorhexidine Gluconate Cloth  6 each Topical Q0600   Chlorhexidine Gluconate Cloth  6 each Topical Q0600   [START ON 08/29/2023] colchicine  0.3 mg Oral Daily   diclofenac Sodium  2 g Topical  QID   insulin aspart  0-5 Units Subcutaneous QHS   insulin aspart  0-9 Units Subcutaneous TID WC   leptospermum manuka honey  1 Application Topical Daily   levothyroxine  175 mcg Oral Q0600   metoprolol tartrate  25 mg Oral BID   midodrine  10 mg Oral Q8H   pantoprazole  40 mg Oral BID AC   revefenacin  175 mcg Nebulization Daily   rosuvastatin  20 mg Oral Daily   sevelamer carbonate  1,600 mg Oral TID WC   sodium chloride flush  10-40 mL Intracatheter Q12H      acetaminophen **OR** acetaminophen, albuterol, hydrALAZINE, hydrOXYzine, meclizine

## 2023-08-28 NOTE — Op Note (Signed)
[facility] Patient Name: [name] [name] Procedure Date : 08/28/2023 MRN: [id number] Attending MD: Kerin Salen , MD, [id number] Date of Birth: 1951/01/29 CSN: [id number] Age: 72 Admit Type: Inpatient Procedure:                Upper GI endoscopy Indications:              Dysphagia, abnormal barium swallow concerning for                            esophageal stricture Providers:                Kerin Salen, MD, Stephens Shire RN, RN, Salley Scarlet, Technician Referring MD:             [facility] Medicines:                Monitored Anesthesia Care Complications:            No immediate complications. Estimated blood loss:                            Minimal. Estimated Blood Loss:     Estimated blood loss was minimal. Procedure:                Pre-Anesthesia Assessment:                           - Prior to the procedure, a History and Physical                            was performed, and patient medications and                            allergies were reviewed. The patient's tolerance of                            previous anesthesia was also reviewed. The risks                            and benefits of the procedure and the sedation                            options and risks were discussed with the patient.                            All questions were answered, and informed consent                            was obtained. Prior Anticoagulants: The patient has                            taken Eliquis (apixaban), last dose was 1 day prior  to procedure. ASA Grade Assessment: III - A patient                            with severe systemic disease. After reviewing the                            risks and benefits, the patient was deemed in                            satisfactory condition to undergo the procedure.                           After obtaining informed consent, the endoscope was                             passed under direct vision. Throughout the                            procedure, the patient's blood pressure, pulse, and                            oxygen saturations were monitored continuously. The                            [id number]-H190 ([id number]) Olympus endoscope was introduced                            through the mouth, and advanced to the second part                            of duodenum. The upper GI endoscopy was                            accomplished without difficulty. The patient                            tolerated the procedure well. Scope In: Scope Out: Findings:      No endoscopic abnormality was evident in the esophagus to explain the       patient's complaint of dysphagia. It was decided, however, to proceed       with dilation of the lower third of the esophagus. A TTS dilator was       passed through the scope. Dilation with an 18-19-20 mm balloon dilator       was performed to 20 mm for 4 consecutive minutes. The dilation site was       examined following endoscope reinsertion and showed no change.      Area was successfully injected with 100 units botulinum toxin.      Biopsies were obtained from the proximal and distal esophagus with cold       forceps for histology of suspected eosinophilic esophagitis.      Patchy mildly erythematous mucosa without bleeding was found in the       gastric antrum. Biopsies were taken with a cold forceps for Helicobacter       pylori testing.  The cardia and gastric fundus were normal on retroflexion.      Localized mildly erythematous mucosa without active bleeding and with no       stigmata of bleeding was found in the duodenal bulb.      The ampulla, first portion of the duodenum and second portion of the       duodenum were normal. Impression:               - No endoscopic esophageal abnormality to explain                            patient's dysphagia. Esophagus dilated. Biopsied.                            Injected with  botulinum toxin.                           - Erythematous mucosa in the antrum. Biopsied.                           - Erythematous duodenopathy.                           - Normal ampulla, first portion of the duodenum and                            second portion of the duodenum.                           - Biopsies were taken with a cold forceps for                            evaluation of eosinophilic esophagitis. Moderate Sedation:      Patient did not receive moderate sedation for this procedure, but       instead received monitored anesthesia care. Recommendation:           - Renal diet today.                           - Continue present medications.                           - Await pathology results. Procedure Code(s):        --- Professional ---                           [phone], Esophagogastroduodenoscopy, flexible,                            transoral; with transendoscopic balloon dilation of                            esophagus (less than 30 mm diameter)                           43239, 59, Esophagogastroduodenoscopy, flexible,  transoral; with biopsy, single or multiple                           43236, 59, Esophagogastroduodenoscopy, flexible,                            transoral; with directed submucosal injection(s),                            any substance Diagnosis Code(s):        --- Professional ---                           R13.10, Dysphagia, unspecified                           K31.89, Other diseases of stomach and duodenum CPT copyright 2022 [employer]. All rights reserved. The codes documented in this report are preliminary and upon coder review may  be revised to meet current compliance requirements. Kerin Salen, MD 08/28/2023 9:31:16 AM This report has been signed electronically. Number of Addenda: 0

## 2023-08-28 NOTE — TOC Progression Note (Signed)
Transition of Care [facility]) - Progression Note    Patient Details  Name: [name] [name] MRN: [id number] Date of Birth: 04/23/1951  [facility] CM/SW Contact  Gordy Clement, RN Phone Number: 08/28/2023, 1:26 PM  Clinical Narrative:     RNCM made aware that Son would like to discuss some things in room- RNCM went to bedside and Son had just stepped out. RNCM asked floor RN to notify when Son returns     Expected Discharge Plan: Home w Home Health Services Barriers to Discharge: Continued Medical Work up  Expected Discharge Plan and Services   Discharge Planning Services: CM Consult Post Acute Care Choice: Home Health, Durable Medical Equipment Living arrangements for the past 2 months: Apartment                 DME Arranged: Bipap DME Agency: [employer] Date DME Agency Contacted: 08/23/23 Time DME Agency Contacted: [phone] Representative spoke with at DME Agency: Macon Large HH Arranged: RN, PT, OT [facility] Agency: [facility] Date [facility] Agency Contacted: 08/23/23 Time HH Agency Contacted: [phone] Representative spoke with at [facility] Agency: Kandee Keen   Social Determinants of Health (SDOH) Interventions SDOH Screenings   Food Insecurity: No Food Insecurity (08/20/2023)  Housing: Patient Declined (08/20/2023)  Transportation Needs: No Transportation Needs (08/20/2023)  Utilities: Not At Risk (08/20/2023)  Tobacco Use: Medium Risk (08/28/2023)    Readmission Risk Interventions    08/21/2023    3:23 PM 05/09/2023    3:37 PM 05/08/2023   12:17 PM  Readmission Risk Prevention Plan  Transportation Screening Complete Complete Complete  Medication Review (RN Care Manager) Referral to Pharmacy Complete Complete  PCP or Specialist appointment within 3-5 days of discharge Complete Complete Complete  HRI or Home Care Consult Complete Complete Complete  SW Recovery Care/Counseling Consult Complete Complete Complete  Palliative Care Screening Not Applicable Not Applicable  Not Applicable  Skilled Nursing Facility Not Applicable Complete Complete

## 2023-08-28 NOTE — Progress Notes (Signed)
Physical Therapy Treatment Patient Details Name: [name] [name] MRN: [id number] DOB: 10/07/1951 Today's Date: 08/28/2023   History of Present Illness The pt is a 72 yo female presenting 08/20/23 with respiratory failure due to PNA.  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    PT Comments  Pt with poor tolerance to treatment today. Pt received in bed stating that she needed to have a bowel movement. Pt today very limited by gout pain and unable to stand in stedy despite multiple attempts with 3 person assist. Per chart review, pt and son no longer want SNF and would like to take pt home. Pt states that they have a hoyer lift at home. DC recs updated to HHPT. PT will continue to follow.    If plan is discharge home, recommend the following: A lot of help with bathing/dressing/bathroom;Assistance with cooking/housework;Assist for transportation;Two people to help with walking and/or transfers   Can travel by private vehicle     No  Equipment Recommendations  None recommended by PT    Recommendations for Other Services       Precautions / Restrictions Precautions Precautions: Fall Precaution Comments: watch sats and bp; gout in feet currently Restrictions Weight Bearing Restrictions: No     Mobility  Bed Mobility Overal bed mobility: Needs Assistance Bed Mobility: Rolling, Supine to Sit, Sit to Supine Rolling: Used rails, Mod assist   Supine to sit: +2 for physical assistance, Mod assist Sit to supine: +2 for physical assistance, Total assist   General bed mobility comments: Pt able to advance BLE to EOB and pull on rail with +2 mod A for trunk elevation and scooting forward at EOB with bedpad. +2 Total to return to bed via  helicopter due to pain.    Transfers                   General transfer comment: Attempted to stand twice with stedy with +3 assist however pt unable clear hips due to gout pain.    Ambulation/Gait               General Gait Details: unable at baseline   Stairs             Wheelchair Mobility     Tilt Bed    Modified Rankin (Stroke Patients Only)       Balance Overall balance assessment: Needs assistance Sitting-balance support: Bilateral upper extremity supported, Feet supported Sitting balance-Leahy Scale: Fair Sitting balance - Comments: sitting EOB                                    Cognition Arousal: Alert Behavior During Therapy: WFL for tasks assessed/performed Overall Cognitive Status: No family/caregiver present to determine baseline cognitive functioning Area of Impairment: Following commands, Problem solving                       Following Commands: Follows one step commands with increased time, Follows multi-step commands with increased time     Problem Solving: Slow processing, Difficulty sequencing, Requires verbal cues, Requires tactile cues          Exercises  General Comments General comments (skin integrity, edema, etc.): VSS on 3L      Pertinent Vitals/Pain Pain Assessment Pain Assessment: Faces Faces Pain Scale: Hurts whole lot Pain Location: Feet when touched or WBing Pain Descriptors / Indicators: Discomfort, Grimacing, Tender, Sore, Jabbing, Moaning Pain Intervention(s): Monitored during session, Limited activity within patient's tolerance    Home Living                          Prior Function            PT Goals (current goals can now be found in the care plan section) Progress towards PT goals: Not progressing toward goals - comment    Frequency    Min 1X/week      PT Plan      Co-evaluation              AM-PAC PT "6 Clicks" Mobility   Outcome  Measure  Help needed turning from your back to your side while in a flat bed without using bedrails?: A Lot Help needed moving from lying on your back to sitting on the side of a flat bed without using bedrails?: A Lot Help needed moving to and from a bed to a chair (including a wheelchair)?: Total Help needed standing up from a chair using your arms (e.g., wheelchair or bedside chair)?: Total Help needed to walk in hospital room?: Total Help needed climbing 3-5 steps with a railing? : Total 6 Click Score: 8    End of Session Equipment Utilized During Treatment: Oxygen;Gait belt Activity Tolerance: Patient limited by pain;Patient limited by fatigue Patient left: in bed;with call bell/phone within reach;with family/visitor present Nurse Communication: Mobility status PT Visit Diagnosis: Other abnormalities of gait and mobility (R26.89);Muscle weakness (generalized) (M62.81)     Time: 0981-1914 PT Time Calculation (min) (ACUTE ONLY): 27 min  Charges:    $Therapeutic Activity: 23-37 mins PT General Charges $$ ACUTE PT VISIT: 1 Visit                     Shela Nevin, PT, DPT Acute Rehab Services [phone]    [name] [name] 08/28/2023, 3:16 PM

## 2023-08-28 NOTE — Progress Notes (Signed)
PROGRESS NOTE        PATIENT DETAILS Name: [name] [name] Age: 72 y.o. Sex: female Date of Birth: [birth year] Admit Date: 08/20/2023 Admitting Physician Lorin Glass, MD YNW:GNFA, [name], FNP  Brief Summary: Patient is a 72 y.o.  female with history of ESRD on HD TTS, HFrEF, COPD on home O2 3 L/min, nonobstructive CAD on recent [facility] 9/27, PAF on Eliquis, small cell cancer of lung-s/p radiation (completed April 06, 2022), OSA-nontolerant to CPAP, HTN, DM-2-who presented to the hospital on 11/12 with shortness of breath-she was found to have PNA-and subsequently admitted to the hospitalist service.  Hospital course subsequently complicated by hypercarbia requiring initiation of BiPAP and transfer to progressive care unit.    Significant events: 10/3-10/12-hospitalization for volume overload/COPD exacerbation/acute metabolic encephalopathy 11/12>> admit to [facility]-PNA 11/14>> somnolent-found to have hypercarbia-BiPAP-transfer to PCU.  Significant studies: 11/12>> CXR: Possible retrocardiac infiltrate-possible left lower lobe consolidation 11/14>> CXR: Worsening pulm edema 11/15>> CXR improved pulmonary edema.  Stable dense left lower lobe airspace disease. 11/18.>> Barium esophagogram shows high-grade stricture and some dysmotility   Significant microbiology data: 11/12>> COVID/influenza/RSV PCR: Negative  Procedures: 11/14>> right-sided TDC catheter by IR  Consults: PCCM Nephrology GI  Subjective:   Patient in bed, appears comfortable, denies any headache, no fever, no chest pain or pressure, no shortness of breath , no abdominal pain. No new focal weakness.  Left ankle discomfort considerably better.   Objective: Vitals: Blood pressure (!) 164/60, pulse 73, temperature 97.9 F (36.6 C), temperature source Oral, resp. rate 19, height 5\' 3"  (1.6 m), weight 96.7 kg, SpO2 98%.   Exam:  Awake Alert, No new F.N deficits, Normal  affect [place].AT,PERRAL Supple Neck, No JVD,   Symmetrical Chest wall movement, Good air movement bilaterally, CTAB RRR,No Gallops, Rubs or new Murmurs,  +ve B.Sounds, Abd Soft, No tenderness,   Left ankle tender to palpate, minimal soft tissue erythema   Assessment/Plan:\  Acute on chronic hypercarbic/hypoxic respiratory failure Multifactorial etiology-primarily due to acute pulm edema due to HFrEF exacerbation-with contributions from underlying PNA/COPD/OSA (previously noncompliant to CPAP) Much better after BiPAP and hemodialysis 11/15 Awaiting nighttime BiPAP now, home BiPAP being arranged, daytime now at baseline 3 L nasal cannula oxygen.   Discussed with case management 11/15-arrangements are ongoing to arrange for home BiPAP.  SpO2: 98 % O2 Flow Rate (L/min): 3 L/min FiO2 (%): 40 %  Acute metabolic encephalopathy Secondary to hypercarbia Improved with BiPAP and supportive care, now close to baseline    LLL PNA Continue IV Unasyn for a few more days before transitioning back to oral Augmentin.   Dysphagia. Barium esophagogram shows high-grade stricture and some dysmotility, will involve GI, switch Eliquis to heparin on 08/27/2023.  Per GI EGD on 08/28/2023.  May require dilation.  COPD Continue scheduled bronchodilators No obvious wheezing-not in exacerbation but suspect still playing a role in hypoxia/hypercarbia.  History of OSA Previously noncompliant to BiPAP/CPAP However has been counseled extensively-tolerated BiPAP 5 hours last night. Continue BiPAP nightly-and as needed during the daytime when sleeping.  Discussed with case management 11/15-arrangements are ongoing to arrange for home BiPAP.  Acute on chronic HFrEF(EF 35-40% by TTE on 04/15/2023) Volume removal with hemodialysis Continue beta-blocker-is on midodrine as well.  PAF Telemetry monitoring Continue beta-blocker Continue Eliquis/Heparin Reviewed most recent cardiology note-thought not to be a  candidate for long-term amiodarone-does not appear  to be on amiodarone any longer.   Nonobstructive CAD on recent [facility] on 9/27 No chest pain Should not be on Plavix any longer as patient is now on Eliquis-Plavix discontinued 11/15 Continue beta-blocker/statin.     History of PAD Statin See above regarding Plavix.   ESRD on HD TTS Nephrology following and directing HD care   Normocytic anemia Secondary to combination of ESRD/critical illness Aranesp/iron deferred to nephrology service Follow CBC  Hyponatremia Mild Improved with volume removal with HD.   Hypothyroidism Synthroid TSH 10/3 stable   Acute on chronic gout Acute left gout arthritis, much improved after single dose steroid on 08/28/2023 and colchicine.  Continue.  Hold allopurinol for now.  History of lung cancer-s/p radiation Recently seen by Dr. Fay Records PET scan planned.  Per prior imaging-some concern for recurrence of underlying cancer.   Osteoarthritis of bilateral knees Stable at present-outpatient orthopedic evaluation   Debility/deconditioning Per patient-she has not ambulated in close to a month-previously minimally ambulatory and mostly bed to wheelchair bound PT/OT eval when more stable.   Nutrition Status: Nutrition Problem: Increased nutrient needs Etiology: chronic illness (ESRD on HD) Signs/Symptoms: estimated needs Interventions: Refer to RD note for recommendations  DM-2 (A1c 7.1 on 7/6) CBG stable-continue SSI, single dose Semglee on 08/27/2023 to cover for steroid she received for gout.  Recent Labs    08/27/23 1232 08/27/23 1617 08/27/23 2101  GLUCAP 191* 246* 303*      Obesity:  Estimated body mass index is 38.66 kg/m follow with PCP for weight loss.  The patient is critically ill with multiple organ system failure and requires high complexity decision making for assessment and support, frequent evaluation and titration of therapies, advanced monitoring, review of  radiographic studies and interpretation of complex data.   Code status:   Code Status: Limited: Do not attempt resuscitation (DNR) -DNR-LIMITED -Do Not Intubate/DNI    DVT Prophylaxis:  >> Hep gtt  Family Communication: Son at bedside 11/15.   Disposition Plan: Status is: Inpatient Remains inpatient appropriate because: Severity of illness   Planned Discharge Destination:Home   Diet: Diet Order             Diet NPO time specified  Diet effective midnight                   MEDICATIONS: Scheduled Meds:  arformoterol  15 mcg Nebulization BID   budesonide (PULMICORT) nebulizer solution  0.5 mg Nebulization BID   Chlorhexidine Gluconate Cloth  6 each Topical Q0600   Chlorhexidine Gluconate Cloth  6 each Topical Q0600   colchicine  0.3 mg Oral BID   diclofenac Sodium  2 g Topical QID   insulin aspart  0-5 Units Subcutaneous QHS   insulin aspart  0-9 Units Subcutaneous TID WC   leptospermum manuka honey  1 Application Topical Daily   levothyroxine  175 mcg Oral Q0600   metoprolol tartrate  25 mg Oral BID   midodrine  10 mg Oral Q8H   pantoprazole  40 mg Oral BID AC   revefenacin  175 mcg Nebulization Daily   rosuvastatin  20 mg Oral Daily   sevelamer carbonate  1,600 mg Oral TID WC   sodium chloride flush  10-40 mL Intracatheter Q12H   Continuous Infusions:   PRN Meds:.acetaminophen **OR** acetaminophen, albuterol, hydrALAZINE, hydrOXYzine, meclizine   I have personally reviewed following labs and imaging studies  LABORATORY DATA:  Recent Labs  Lab 08/22/23 0533 08/23/23 0839 08/24/23 0426 08/25/23 0310 08/26/23 0900 08/28/23  0358  WBC 11.7* 8.9 8.5 8.4 8.4 9.3  HGB 9.4* 8.7* 7.6* 8.2* 7.9* 7.8*  HCT 34.2* 29.3* 25.7* 28.0* 27.3* 26.7*  PLT 235 176 168 197 207 242  MCV 112.5* 107.7* 106.6* 106.5* 107.1* 105.1*  MCH 30.9 32.0 31.5 31.2 31.0 30.7  MCHC 27.5* 29.7* 29.6* 29.3* 28.9* 29.2*  RDW 15.4 15.5 15.4 15.5 15.4 15.3  LYMPHSABS 0.7 0.4*  --   --    --   --   MONOABS 0.7 0.6  --   --   --   --   EOSABS 0.0 0.0  --   --   --   --   BASOSABS 0.0 0.0  --   --   --   --     Recent Labs  Lab 08/23/23 0839 08/24/23 0426 08/25/23 0310 08/26/23 0900 08/28/23 0559  NA 135 134* 135 137 133*  K 3.6 3.7 3.6 3.7 4.2  CL 93* 94* 96* 98 94*  CO2 29 29 29 27 27   ANIONGAP 13 11 10 12 12   GLUCOSE 132* 126* 153* 132* 150*  BUN 36* 49* 29* 48* 56*  CREATININE 4.94* 6.08* 4.24* 6.29* 6.87*  ALBUMIN 3.2* 2.9* 3.1* 2.8*  --   CRP  --   --   --   --  1.6*  CALCIUM 9.0 9.0 9.0 9.2 9.9     Recent Labs  Lab 08/23/23 0839 08/24/23 0426 08/25/23 0310 08/26/23 0900 08/28/23 0559  CRP  --   --   --   --  1.6*  CALCIUM 9.0 9.0 9.0 9.2 9.9      RADIOLOGY STUDIES/RESULTS: DG Ankle Complete Left  Result Date: 08/26/2023 CLINICAL DATA:  Pain in anterior ankle EXAM: LEFT ANKLE COMPLETE - 3+ VIEW COMPARISON:  04/12/2021 FINDINGS: Frontal, oblique, lateral views of the left ankle are obtained. The bones are severely osteopenic. No acute fracture, subluxation, or dislocation. Osteoarthritis of the ankle and hindfoot. Mild diffuse subcutaneous edema. Diffuse vascular calcifications. IMPRESSION: 1. Osteopenia.  No acute fracture. 2. Osteoarthritis of the ankle and hindfoot. 3. Diffuse subcutaneous edema. Electronically Signed   By: Sharlet Salina M.D.   On: 08/26/2023 21:37   DG ESOPHAGUS W SINGLE CM (SOL OR THIN BA)  Result Date: 08/26/2023 CLINICAL DATA:  Dysphagia with concern for stricture. EXAM: ESOPHAGUS/BARIUM SWALLOW/TABLET STUDY TECHNIQUE: Single contrast examination was performed using thin liquid barium. This exam was performed by Corrin Parker, PA-C, and was supervised and interpreted by Dr. Leanna Battles. FLUOROSCOPY: Radiation Exposure Index (as provided by the fluoroscopic device): 29.0 mGy Kerma COMPARISON:  None Available. FINDINGS: Swallowing: Appears normal. No vestibular penetration or aspiration seen. Pharynx: Unremarkable. Esophagus:  No esophageal fold thickening. There is a stricture at the gastroesophageal junction. Esophageal motility: Present. Hiatal Hernia: None. Gastroesophageal reflux: None. Ingested 13mm barium tablet: Not given due to stricture. Other: Exam limited secondary to patient's immobility. 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 Associated dysmotility. Electronically Signed   By: Leanna Battles M.D.   On: 08/26/2023 16:48     LOS: 8 days   Signature  -    Susa Raring M.D on 08/28/2023 at 7:38 AM   -  To page go to [email or web address]

## 2023-08-28 NOTE — Interval H&P Note (Signed)
History and Physical Interval Note: 72/female with dysphagia and abnormal barium swallow concerning for esophageal stricture, for EGD with possible balloon dilation with propofol.  08/28/2023 8:42 AM  [name] [name]  has presented today for EGD with possible balloon dilation with propofol, with the diagnosis of abnormal barium swallow, esophageal stricture.  The various methods of treatment have been discussed with the patient and family. After consideration of risks, benefits and other options for treatment, the patient has consented to  Procedure(s): ESOPHAGOGASTRODUODENOSCOPY (EGD) WITH PROPOFOL (N/A) as a surgical intervention.  The patient's history has been reviewed, patient examined, no change in status, stable for surgery.  I have reviewed the patient's chart and labs.  Questions were answered to the patient's satisfaction.     Kerin Salen

## 2023-08-29 ENCOUNTER — Inpatient Hospital Stay (HOSPITAL_COMMUNITY): Payer: 59

## 2023-08-29 ENCOUNTER — Other Ambulatory Visit (HOSPITAL_COMMUNITY): Payer: Self-pay

## 2023-08-29 DIAGNOSIS — J9602 Acute respiratory failure with hypercapnia: Secondary | ICD-10-CM | POA: Diagnosis not present

## 2023-08-29 HISTORY — PX: IR REMOVAL TUN CV CATH W/O FL: IMG2289

## 2023-08-29 LAB — CBC
HCT: 26.9 % — ABNORMAL LOW (ref 36.0–46.0)
Hemoglobin: 7.8 g/dL — ABNORMAL LOW (ref 12.0–15.0)
MCH: 31 pg (ref 26.0–34.0)
MCHC: 29 g/dL — ABNORMAL LOW (ref 30.0–36.0)
MCV: 106.7 fL — ABNORMAL HIGH (ref 80.0–100.0)
Platelets: 245 10*3/uL (ref 150–400)
RBC: 2.52 MIL/uL — ABNORMAL LOW (ref 3.87–5.11)
RDW: 15.3 % (ref 11.5–15.5)
WBC: 9.3 10*3/uL (ref 4.0–10.5)
nRBC: 0.6 % — ABNORMAL HIGH (ref 0.0–0.2)

## 2023-08-29 LAB — GLUCOSE, CAPILLARY
Glucose-Capillary: 120 mg/dL — ABNORMAL HIGH (ref 70–99)
Glucose-Capillary: 153 mg/dL — ABNORMAL HIGH (ref 70–99)
Glucose-Capillary: 174 mg/dL — ABNORMAL HIGH (ref 70–99)

## 2023-08-29 LAB — SURGICAL PATHOLOGY

## 2023-08-29 MED ORDER — HEPARIN SODIUM (PORCINE) 1000 UNIT/ML IJ SOLN
1500.0000 [IU] | Freq: Once | INTRAMUSCULAR | Status: DC
  Filled 2023-08-29: qty 1.5
  Filled 2023-08-29: qty 2

## 2023-08-29 MED ORDER — HEPARIN SODIUM (PORCINE) 1000 UNIT/ML IJ SOLN
2000.0000 [IU] | Freq: Once | INTRAMUSCULAR | Status: AC
  Administered 2023-08-29: 2000 [IU] via INTRAVENOUS
  Filled 2023-08-29: qty 2

## 2023-08-29 MED ORDER — PENTAFLUOROPROP-TETRAFLUOROETH EX AERO
1.0000 | INHALATION_SPRAY | CUTANEOUS | Status: DC | PRN
Start: 2023-08-29 — End: 2023-08-29

## 2023-08-29 MED ORDER — LIDOCAINE HCL (PF) 1 % IJ SOLN
5.0000 mL | INTRAMUSCULAR | Status: DC | PRN
Start: 2023-08-29 — End: 2023-08-29

## 2023-08-29 MED ORDER — HEPARIN SODIUM (PORCINE) 1000 UNIT/ML DIALYSIS: 1500.0000 [IU] | INTRAMUSCULAR | Status: DC | PRN

## 2023-08-29 MED ORDER — LIDOCAINE-PRILOCAINE 2.5-2.5 % EX CREA: 1.0000 | TOPICAL_CREAM | CUTANEOUS | Status: DC | PRN

## 2023-08-29 MED ORDER — HEPARIN SODIUM (PORCINE) 1000 UNIT/ML DIALYSIS
1000.0000 [IU] | INTRAMUSCULAR | Status: DC | PRN
Start: 2023-08-29 — End: 2023-08-29

## 2023-08-29 MED ORDER — ALLOPURINOL 100 MG PO TABS: 100.0000 mg | ORAL_TABLET | Freq: Every day | ORAL | Status: DC

## 2023-08-29 MED ORDER — ALTEPLASE 2 MG IJ SOLR: 2.0000 mg | Freq: Once | INTRAMUSCULAR | Status: DC | PRN

## 2023-08-29 MED ORDER — ANTICOAGULANT SODIUM CITRATE 4% (200MG/5ML) IV SOLN: 5.0000 mL | Status: DC | PRN

## 2023-08-29 MED ORDER — NEPRO/CARBSTEADY PO LIQD
237.0000 mL | ORAL | Status: DC | PRN
Start: 2023-08-29 — End: 2023-08-29

## 2023-08-29 MED ORDER — HEPARIN SODIUM (PORCINE) 1000 UNIT/ML DIALYSIS
2000.0000 [IU] | Freq: Once | INTRAMUSCULAR | Status: DC
  Filled 2023-08-29 (×2): qty 2

## 2023-08-29 MED ORDER — COLCHICINE 0.6 MG PO TABS
0.3000 mg | ORAL_TABLET | Freq: Every day | ORAL | 0 refills | Status: DC
  Filled 2023-08-29: qty 12, 24d supply, fill #0

## 2023-08-29 NOTE — Progress Notes (Signed)
   08/29/23 1807  Vitals  Temp 97.6 F (36.4 C)  Pulse Rate 67  Resp 16  BP (!) 110/47  SpO2 100 %  O2 Device Nasal Cannula  Weight 96.5 kg  Type of Weight Post-Dialysis  Oxygen Therapy  O2 Flow Rate (L/min) 3 L/min  Patient Activity (if Appropriate) In bed  Pulse Oximetry Type Continuous  Oximetry Probe Site Changed No  Post Treatment  Dialyzer Clearance Lightly streaked  Hemodialysis Intake (mL) 0 mL  Liters Processed 72  Fluid Removed (mL) 3000 mL  Tolerated HD Treatment Yes   Received patient in bed to unit.  Alert and oriented.  Informed consent signed and in chart.   TX duration:3.0--per nephrology orders  Patient tolerated well.  Transported back to the room  Alert, without acute distress.  Hand-off given to patient's nurse.   Access used: RUAF Access issues: no complications  Total UF removed: 3000 Medication(s) given: none   [facility]

## 2023-08-29 NOTE — Discharge Summary (Signed)
[name] [name] NWG:[id number] DOB: 24-Oct-1950 DOA: 08/20/2023  PCP: Vladimir Crofts, FNP  Admit date: 08/20/2023  Discharge date: 08/29/2023  Admitted From: Home   Disposition:  Home   Recommendations for Outpatient Follow-up:   Follow up with PCP in 1-2 weeks  PCP Please obtain BMP/CBC, 2 view CXR in 1week,  (see Discharge instructions)   PCP Please follow up on the following pending results:    Home Health: PT, RN, Aide if qualifies   Equipment/Devices: as below  Discharge Condition: Stable    CODE STATUS: Full    Diet Recommendation: Renal diet with 1.2 L fluid restriction per day.  Check CBGs q. ACH S.    Chief Complaint  Patient presents with   Respiratory Distress     Brief history of present illness from the day of admission and additional interim summary    72 y.o.  female with history of ESRD on HD TTS, HFrEF, COPD on home O2 3 L/min, nonobstructive CAD on recent [facility] 9/27, PAF on Eliquis, small cell cancer of lung-s/p radiation (completed April 06, 2022), OSA-nontolerant to CPAP, HTN, DM-2-who presented to the hospital on 11/12 with shortness of breath-she was found to have PNA-and subsequently admitted to the hospitalist service.  Hospital course subsequently complicated by hypercarbia requiring initiation of BiPAP and transfer to progressive care unit.     Significant events: 10/3-10/12-hospitalization for volume overload/COPD exacerbation/acute metabolic encephalopathy 11/12>> admit to [facility]-PNA 11/14>> somnolent-found to have hypercarbia-BiPAP-transfer to PCU.   Significant studies: 11/12>> CXR: Possible retrocardiac infiltrate-possible left lower lobe consolidation 11/14>> CXR: Worsening pulm edema 11/15>> CXR improved pulmonary edema.  Stable dense left lower lobe airspace disease. 11/18.>>  Barium esophagogram shows high-grade stricture and some dysmotility     Significant microbiology data: 11/12>> COVID/influenza/RSV PCR: Negative   Procedures: 11/14>> right-sided TDC catheter by IR   Consults: PCCM Nephrology GI                                                                 Hospital Course   Acute on chronic hypercarbic/hypoxic respiratory failure Multifactorial etiology-primarily due to acute pulm edema due to HFrEF exacerbation-with contributions from underlying PNA/COPD/OSA (previously noncompliant to CPAP) Much better after BiPAP and hemodialysis 11/15 Awaiting nighttime BiPAP now, home BiPAP being arranged, daytime now at baseline 3 L nasal cannula oxygen to continue, this is her baseline.  Has been strictly counseled on compliance, will follow with PCP and also with pulmonary outpatient for formal sleep study.   Discussed with case management 11/15-arrangements are ongoing to arrange for home BiPAP.      Acute metabolic encephalopathy Secondary to hypercarbia Improved with BiPAP and supportive care, now at her baseline.     LLL PNA Continue IV Unasyn for a few more days before transitioning back to oral Augmentin.  Dysphagia. Barium esophagogram shows high-grade stricture and some dysmotility, seen by [facility] underwent EGD on 08/28/2023 where her stricture was dilated, continue PPI, resume Eliquis, esophageal biopsies obtained results pending.  Post discharge follow-up with PCP and [facility] in 1 to 2 weeks.  Dysphagia much improved.   COPD Continue scheduled bronchodilators No obvious wheezing-not in exacerbation but suspect still playing a role in hypoxia/hypercarbia.   History of OSA Previously noncompliant to BiPAP/CPAP However has been counseled extensively-tolerated BiPAP 5 hours last night. Continue BiPAP nightly-and as needed during the daytime when sleeping.  Discussed with case management 11/15-arrangements are ongoing to arrange for home  BiPAP.   Acute on chronic HFrEF(EF 35-40% by TTE on 04/15/2023) Volume removal with hemodialysis Continue beta-blocker-is on midodrine as well.   PAF Telemetry monitoring Continue beta-blocker Continue Eliquis  Reviewed most recent cardiology note-thought not to be a candidate for long-term amiodarone-does not appear to be on amiodarone any longer.   Nonobstructive CAD on recent [facility] on 9/27 No chest pain Should not be on Plavix any longer as patient is now on Eliquis-Plavix discontinued 11/15 Continue beta-blocker/statin.     History of PAD Statin See above regarding Plavix.   ESRD on HD TTS Nephrology following and directing HD care   Normocytic anemia Secondary to combination of ESRD/critical illness Aranesp/iron deferred to nephrology service Follow CBC   Hyponatremia Mild Improved with volume removal with HD.   Hypothyroidism Synthroid TSH 10/3 stable   Acute on chronic gout Acute left gout arthritis, much improved after single dose steroid on 08/28/2023 and colchicine.  Continue.  Hold allopurinol for now.  Terms have resolved to follow-up with PCP in 1 to 2 weeks.   History of lung cancer-s/p radiation Recently seen by Dr. Fay Records PET scan planned.  Per prior imaging-some concern for recurrence of underlying cancer.   Osteoarthritis of bilateral knees Stable at present-outpatient orthopedic evaluation   Debility/deconditioning Per patient-she has not ambulated in close to a month-previously minimally ambulatory and mostly bed to wheelchair bound PT/OT eval when more stable.   Nutrition Status: Nutrition Problem: Increased nutrient needs Etiology: chronic illness (ESRD on HD) Signs/Symptoms: estimated needs Interventions: Refer to RD note for recommendations   DM-2 (A1c 7.1 on 7/6) Continue home regimen, check CBGs q. ACH S    EGD -  Impression:  - No endoscopic esophageal abnormality to explain patient's dysphagia. Esophagus dilated.  Biopsied.                            Injected with botulinum toxin.                           - Erythematous mucosa in the antrum. Biopsied.                           - Erythematous duodenopathy.                           - Normal ampulla, first portion of the duodenum and                            second portion of the duodenum.                           -  Biopsies were taken with a cold forceps for                            evaluation of eosinophilic esophagitis. Moderate Sedation:      Patient did not receive moderate sedation for this procedure, but       instead received monitored anesthesia care. Recommendation:           - Renal diet today.                           - Continue present medications.                           - Await pathology results.  Discharge diagnosis     Principal Problem:   Acute respiratory failure (HCC) Active Problems:   Hypotension   Acute encephalopathy   Acute congestive heart failure [facility])    Discharge instructions    Discharge Instructions     Discharge instructions   Complete by: As directed    Follow with Primary MD Vladimir Crofts, FNP in 7 days   Get CBC, CMP, 2 view Chest X ray -  checked next visit with your primary MD   Activity: As tolerated with Full fall precautions use walker/cane & assistance as needed  Disposition Home   Diet: Renal diet, 1.2 L fluid restriction per day.  Check CBGs q. ACH S.  Special Instructions: If you have smoked or chewed Tobacco  in the last 2 yrs please stop smoking, stop any regular Alcohol  and or any Recreational drug use.  On your next visit with your primary care physician please Get Medicines reviewed and adjusted.  Please request your Prim.MD to go over all Hospital Tests and Procedure/Radiological results at the follow up, please get all Hospital records sent to your Prim MD by signing hospital release before you go home.  If you experience worsening of your admission symptoms, develop  shortness of breath, life threatening emergency, suicidal or homicidal thoughts you must seek medical attention immediately by calling 911 or calling your MD immediately  if symptoms less severe.  You Must read complete instructions/literature along with all the possible adverse reactions/side effects for all the Medicines you take and that have been prescribed to you. Take any new Medicines after you have completely understood and accpet all the possible adverse reactions/side effects.   Do not drive when taking Pain medications.  Do not take more than prescribed Pain, Sleep and Anxiety Medications   Discharge wound care:   Complete by: As directed    Apply Medihoney to right arm wound Q day, then cover with foam dressing.  Change foam dressing Q 3 days or PRN soiling   Increase activity slowly   Complete by: As directed        Discharge Medications   Allergies as of 08/29/2023       Reactions   Nebivolol Swelling   Chest pain (reaction to Bystolic)   Zestril [lisinopril] Swelling, Other (See Comments)   Ace Inhibitors Swelling, Other (See Comments)   Tongue swell   Morphine And Codeine Itching        Medication List     STOP taking these medications    amiodarone 200 MG tablet Commonly known as: PACERONE   clopidogrel 75 MG tablet Commonly known as: PLAVIX   Incruse Ellipta 62.5  MCG/ACT Aepb Generic drug: umeclidinium bromide   lidocaine 4 %   pregabalin 25 MG capsule Commonly known as: LYRICA       TAKE these medications    acetaminophen 500 MG tablet Commonly known as: TYLENOL Take 1 tablet (500 mg total) by mouth See admin instructions. Take 1,000mg  (2 tablets) by mouth twice a day in addition to 1,000mg  (2 tablets) twice daily as needed for pain. What changed:  how much to take when to take this additional instructions   albuterol (2.5 MG/3ML) 0.083% nebulizer solution Commonly known as: PROVENTIL Take 3 mLs (2.5 mg total) by nebulization every 6  (six) hours as needed for wheezing or shortness of breath. What changed: when to take this   albuterol 108 (90 Base) MCG/ACT inhaler Commonly known as: VENTOLIN HFA Inhale 2 puffs into the lungs every 6 (six) hours as needed for shortness of breath (COPD). What changed: Another medication with the same name was changed. Make sure you understand how and when to take each.   allopurinol 100 MG tablet Commonly known as: ZYLOPRIM Take 1 tablet (100 mg total) by mouth daily. Start taking on: September 11, 2023 What changed: These instructions start on September 11, 2023. If you are unsure what to do until then, ask your doctor or other care provider.   budesonide-formoterol 160-4.5 MCG/ACT inhaler Commonly known as: SYMBICORT Inhale 2 puffs into the lungs in the morning and at bedtime.   colchicine 0.6 MG tablet Take 0.5 tablets (0.3 mg total) by mouth daily.   diclofenac Sodium 1 % Gel Commonly known as: VOLTAREN Apply 2 g topically 4 (four) times daily. What changed:  how much to take when to take this   docusate sodium 100 MG capsule Commonly known as: COLACE Take 1 capsule (100 mg total) by mouth daily.   Eliquis 5 MG Tabs tablet Generic drug: apixaban Take 1 tablet (5 mg total) by mouth 2 (two) times daily.   fluticasone 50 MCG/ACT nasal spray Commonly known as: FLONASE Place 1 spray into both nostrils daily. What changed: how much to take   insulin lispro 100 UNIT/ML KwikPen Commonly known as: HumaLOG KwikPen 0-15 Units, Subcutaneous, 3 times daily with meal CBG < 70: Implement Hypoglycemia meausers CBG 70 - 120: 0 units CBG 121 - 150: 2 units CBG 151 - 200: 3 units CBG 201 - 250: 5 units CBG 251 - 300: 8 units CBG 301 - 350: 11 units CBG 351 - 400: 15 units CBG > 400: call MD What changed:  how much to take how to take this when to take this additional instructions   levothyroxine 175 MCG tablet Commonly known as: SYNTHROID Take 1 tablet (175 mcg total) by mouth  daily.   lidocaine-prilocaine cream Commonly known as: EMLA Apply 1 Application topically See admin instructions. On dialysis days (Tues,Thurs,Sat)   meclizine 25 MG tablet Commonly known as: ANTIVERT Take 25 mg by mouth as needed for dizziness or nausea.   metoprolol tartrate 25 MG tablet Commonly known as: LOPRESSOR Take 1.5 tablets (37.5 mg total) by mouth 2 (two) times daily.   midodrine 5 MG tablet Commonly known as: PROAMATINE Take 3 tablets (15 mg total) by mouth every 8 (eight) hours.   MIRCERA IJ Inject 1 Syringe into the skin every 14 (fourteen) days.   multivitamins with iron Tabs tablet Take 1 tablet by mouth in the morning.   nitroGLYCERIN 0.4 MG SL tablet Commonly known as: Nitrostat Place 1 tablet (0.4 mg total) under  the tongue every 5 (five) minutes as needed for chest pain.   ondansetron 4 MG disintegrating tablet Commonly known as: ZOFRAN-ODT Take 2 mg by mouth every 6 (six) hours as needed for nausea or vomiting.   OXYGEN Inhale into the lungs.   pantoprazole 40 MG tablet Commonly known as: PROTONIX Take 1 tablet (40 mg total) by mouth 2 (two) times daily before a meal. What changed: when to take this   rosuvastatin 20 MG tablet Commonly known as: CRESTOR Take 1 tablet (20 mg total) by mouth daily.   sevelamer carbonate 800 MG tablet Commonly known as: RENVELA Take 2 tablets (1,600 mg total) by mouth 3 (three) times daily with meals.   thiamine 100 MG tablet Commonly known as: VITAMIN B1 Take 1 tablet (100 mg total) by mouth in the morning.               Durable Medical Equipment  (From admission, onward)           Start     Ordered   08/23/23 1342  For home use only DME Bipap  Once       Comments: 17/5 back up rate of 22  Question Answer Comment  Length of Need Lifetime   Bleed in oxygen (LPM) 3   Keep O2 saturation between 88-90%   Inspiratory pressure OTHER SEE COMMENTS   Expiratory pressure 8      08/23/23 1342               Discharge Care Instructions  (From admission, onward)           Start     Ordered   08/29/23 0000  Discharge wound care:       Comments: Apply Medihoney to right arm wound Q day, then cover with foam dressing.  Change foam dressing Q 3 days or PRN soiling   08/29/23 0853             Follow-up Information     [facility], [facility] Follow up.   Specialty: Home Health Services Why: HOme health company they will call you to set up services Contact information: [address] [place] [address] [phone]         [facility], [facility] Follow up.   Why: Your oxygen and bIpap provider Contact information: [address] [address] [phone]         Vladimir Crofts, FNP. Schedule an appointment as soon as possible for a visit in 1 week(s).   Specialty: Family Medicine Contact information: [address] [place] [address] [phone]         Luciano Cutter, MD. Schedule an appointment as soon as possible for a visit in 1 week(s).   Specialty: Pulmonary Disease Why: sleep study Contact information: [address] [place] [address] [phone]         Kerin Salen, MD. Schedule an appointment as soon as possible for a visit in 2 week(s).   Specialty: Gastroenterology Contact information: [address] [place] [address] [phone]                 Major procedures and Radiology Reports - PLEASE review detailed and final reports thoroughly  -      DG Ankle Complete Left  Result Date: 08/26/2023 CLINICAL DATA:  Pain in anterior ankle EXAM: LEFT ANKLE COMPLETE - 3+ VIEW COMPARISON:  04/12/2021 FINDINGS:  Frontal, oblique, lateral views of the left ankle are obtained. The bones are severely osteopenic. No acute fracture, subluxation, or dislocation. Osteoarthritis of the ankle and hindfoot. Mild diffuse subcutaneous edema.  Diffuse vascular calcifications. IMPRESSION: 1. Osteopenia.  No acute fracture. 2. Osteoarthritis of the ankle and hindfoot. 3. Diffuse subcutaneous edema. Electronically Signed   By: Sharlet Salina M.D.   On: 08/26/2023 21:37   DG ESOPHAGUS W SINGLE CM (SOL OR THIN BA)  Result Date: 08/26/2023 CLINICAL DATA:  Dysphagia with concern for stricture. EXAM: ESOPHAGUS/BARIUM SWALLOW/TABLET STUDY TECHNIQUE: Single contrast examination was performed using thin liquid barium. This exam was performed by Corrin Parker, PA-C, and was supervised and interpreted by Dr. Leanna Battles. FLUOROSCOPY: Radiation Exposure Index (as provided by the fluoroscopic device): 29.0 mGy Kerma COMPARISON:  None Available. FINDINGS: Swallowing: Appears normal. No vestibular penetration or aspiration seen. Pharynx: Unremarkable. Esophagus: No esophageal fold thickening. There is a stricture at the gastroesophageal junction. Esophageal motility: Present. Hiatal Hernia: None. Gastroesophageal reflux: None. Ingested 13mm barium tablet: Not given due to stricture. Other: Exam limited secondary to patient's immobility. 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 Associated dysmotility. Electronically Signed   By: Leanna Battles M.D.   On: 08/26/2023 16:48   DG Chest Port 1 View  Result Date: 08/24/2023 CLINICAL DATA:  Shortness of breath EXAM: PORTABLE CHEST 1 VIEW COMPARISON:  08/23/2023 FINDINGS: Right IJ central line remains in place. Stable cardiomegaly. Pulmonary vascular congestion. Similar perihilar and bibasilar interstitial opacities. Small bilateral pleural effusions, similar to slightly increased. No appreciable pneumothorax. IMPRESSION: 1. Cardiomegaly with pulmonary vascular congestion and interstitial edema. 2. Small bilateral pleural effusions, similar to slightly increased. Electronically Signed   By: Duanne Guess D.O.   On: 08/24/2023 11:32   DG Chest Port 1V same Day  Result Date:  08/23/2023 CLINICAL DATA:  [id number] with shortness of breath. EXAM: PORTABLE CHEST 1 VIEW COMPARISON:  Portable chest yesterday at 8:18 a.m. FINDINGS: 7:01 a.m. There is a new right IJ infusion catheter with the tip about the superior cavoatrial junction. There is no pneumothorax. There are stable dense left lower lobe airspace disease and small left pleural effusion. The heart is enlarged. Perihilar vascular congestion and mild central edema are still seen but improved, with regression of subpleural edema from the peripheral lungs to the center. The right sulci are sharp. The mediastinum is stable with aortic atherosclerosis. Patient rotated to the left. IMPRESSION: 1. New right IJ infusion catheter with the tip about the superior cavoatrial junction. No pneumothorax. 2. Cardiomegaly with perihilar vascular congestion and mild central edema, improved. 3. Stable dense left lower lobe airspace disease and small left pleural effusion. Electronically Signed   By: Almira Bar M.D.   On: 08/23/2023 07:28   IR Fluoro Guide CV Line Right  Result Date: 08/22/2023 INDICATION: Poor IV access.  ESRD on HD via a fistula.  Rapid response. EXAM: ULTRASOUND AND FLUOROSCOPIC GUIDED PLACEMENT OF TUNNELED CENTRAL VENOUS CATHETER MEDICATIONS: None. ANESTHESIA/SEDATION: Local anesthetic was administered. The patient was continuously monitored during the procedure by the interventional radiology nurse under my direct supervision. FLUOROSCOPY TIME:  Fluoroscopic dose; 2 mGy COMPLICATIONS: None immediate. PROCEDURE: Informed written consent was obtained from the patient and/or patient's representative after a discussion of the risks, benefits, and alternatives to treatment. Questions regarding the procedure were encouraged and answered. The RIGHT neck and chest were prepped with chlorhexidine in a sterile fashion, and a sterile drape was applied covering the operative field. Maximum barrier sterile technique with  sterile gowns and  gloves were used for the procedure. A timeout was performed prior to the initiation of the procedure. After the overlying soft tissues were anesthetized, a small venotomy incision was created and a micropuncture kit was utilized to access the internal jugular vein. Real-time ultrasound guidance was utilized for vascular access including the acquisition of a permanent ultrasound image documenting patency of the accessed vessel. The microwire was utilized to measure appropriate catheter length. The micropuncture sheath was exchanged for a peel-away sheath over a guidewire. A 5 Fr dual lumen tunneled central venous catheter measuring 22 cm was tunneled in a retrograde fashion from the anterior chest wall to the venotomy incision. The catheter was then placed through the peel-away sheath with tip ultimately positioned at the superior caval-atrial junction. Final catheter positioning was confirmed and documented with a spot radiographic image. The catheter aspirates and flushes normally. The catheter was flushed with appropriate volume heparin dwells. The catheter exit site was secured with a 0-Prolene retention suture. The venotomy incision was closed with Dermabond. Dressings were applied. The patient tolerated the procedure well without immediate post procedural complication. FINDINGS: After catheter placement, the tip lies within the superior cavoatrial junction. The catheter aspirates and flushes normally and is ready for immediate use. IMPRESSION: Successful placement of 22 cm dual lumen tunneled central venous "Powerline" catheter via the RIGHT internal jugular vein The tip of the catheter is positioned at the superior cavo-atrial junction. The catheter is ready for immediate use. Roanna Banning, MD [facility] Electronically Signed   By: Roanna Banning M.D.   On: 08/22/2023 14:57   IR US Guide Vasc Access Right  Result Date: 08/22/2023 INDICATION: Poor IV  access.  ESRD on HD via a fistula.  Rapid response. EXAM: ULTRASOUND AND FLUOROSCOPIC GUIDED PLACEMENT OF TUNNELED CENTRAL VENOUS CATHETER MEDICATIONS: None. ANESTHESIA/SEDATION: Local anesthetic was administered. The patient was continuously monitored during the procedure by the interventional radiology nurse under my direct supervision. FLUOROSCOPY TIME:  Fluoroscopic dose; 2 mGy COMPLICATIONS: None immediate. PROCEDURE: Informed written consent was obtained from the patient and/or patient's representative after a discussion of the risks, benefits, and alternatives to treatment. Questions regarding the procedure were encouraged and answered. The RIGHT neck and chest were prepped with chlorhexidine in a sterile fashion, and a sterile drape was applied covering the operative field. Maximum barrier sterile technique with sterile gowns and gloves were used for the procedure. A timeout was performed prior to the initiation of the procedure. After the overlying soft tissues were anesthetized, a small venotomy incision was created and a micropuncture kit was utilized to access the internal jugular vein. Real-time ultrasound guidance was utilized for vascular access including the acquisition of a permanent ultrasound image documenting patency of the accessed vessel. The microwire was utilized to measure appropriate catheter length. The micropuncture sheath was exchanged for a peel-away sheath over a guidewire. A 5 Fr dual lumen tunneled central venous catheter measuring 22 cm was tunneled in a retrograde fashion from the anterior chest wall to the venotomy incision. The catheter was then placed through the peel-away sheath with tip ultimately positioned at the superior caval-atrial junction. Final catheter positioning was confirmed and documented with a spot radiographic image. The catheter aspirates and flushes normally. The catheter was flushed with appropriate volume heparin dwells. The catheter exit site was secured  with a 0-Prolene retention suture. The venotomy incision was closed with Dermabond. Dressings were applied. The patient tolerated the procedure well without immediate  post procedural complication. FINDINGS: After catheter placement, the tip lies within the superior cavoatrial junction. The catheter aspirates and flushes normally and is ready for immediate use. IMPRESSION: Successful placement of 22 cm dual lumen tunneled central venous "Powerline" catheter via the RIGHT internal jugular vein The tip of the catheter is positioned at the superior cavo-atrial junction. The catheter is ready for immediate use. Roanna Banning, MD [facility] Electronically Signed   By: Roanna Banning M.D.   On: 08/22/2023 14:57   DG Chest Port 1 View  Result Date: 08/22/2023 CLINICAL DATA:  [id number] Acute respiratory distress [id number] EXAM: PORTABLE CHEST 1 VIEW COMPARISON:  August 20, 2023 FINDINGS: Marked cardiomegaly. There is increased perihilar and interstitial density. Similar left retrocardiac consolidation. No acute osseous abnormality. IMPRESSION: 1. Cardiomegaly with worsening perihilar and interstitial densities, suggestive of worsening pulmonary edema. 2. Similar left retrocardiac density, with considerations including atelectasis/consolidation versus effusion. Electronically Signed   By: Olive Bass M.D.   On: 08/22/2023 09:16   DG Chest Portable 1 View  Result Date: 08/20/2023 CLINICAL DATA:  Shortness of breath. EXAM: PORTABLE CHEST 1 VIEW COMPARISON:  Portable chest 07/16/2023, portable chest 07/12/2023. FINDINGS: Severe cardiomegaly is again noted. Central vascular prominence is seen but less pronounced than previously. There is no overt edema. Opacity is again noted in the retrocardiac area, which could be due to compressive atelectasis from the enlarged heart or could be due to left lower lobe consolidation. There is no measurable pleural effusion. The  mediastinum is stable. Osteopenia and thoracic spondylosis. IMPRESSION: 1. Severe cardiomegaly with central vascular prominence but less pronounced than previously. No overt edema. 2. Opacity in the retrocardiac area could be due to compressive atelectasis from the enlarged heart or could be due to left lower lobe consolidation. Electronically Signed   By: Almira Bar M.D.   On: 08/20/2023 07:14    Micro Results    Recent Results (from the past 240 hour(s))  Resp panel by RT-PCR (RSV, Flu A&B, Covid) Anterior Nasal Swab     Status: None   Collection Time: 08/20/23  4:56 AM   Specimen: Anterior Nasal Swab  Result Value Ref Range Status   SARS Coronavirus 2 by RT PCR NEGATIVE NEGATIVE Final   Influenza A by PCR NEGATIVE NEGATIVE Final   Influenza B by PCR NEGATIVE NEGATIVE Final    Comment: (NOTE) The Xpert Xpress SARS-CoV-2/FLU/RSV plus assay is intended as an aid in the diagnosis of influenza from Nasopharyngeal swab specimens and should not be used as a sole basis for treatment. Nasal washings and aspirates are unacceptable for Xpert Xpress SARS-CoV-2/FLU/RSV testing.  Fact Sheet for Patients: BloggerCourse.com  Fact Sheet for Healthcare Providers: SeriousBroker.it  This test is not yet approved or cleared by the [place] FDA and has been authorized for detection and/or diagnosis of SARS-CoV-2 by FDA under an Emergency Use Authorization (EUA). This EUA will remain in effect (meaning this test can be used) for the duration of the COVID-19 declaration under Section 564(b)(1) of the Act, 21 U.S.C. section 360bbb-3(b)(1), unless the authorization is terminated or revoked.     Resp Syncytial Virus by PCR NEGATIVE NEGATIVE Final    Comment: (NOTE) Fact Sheet for Patients: BloggerCourse.com  Fact Sheet for Healthcare Providers: SeriousBroker.it  This test is not yet approved  or cleared by the [place] FDA and has been authorized for detection and/or diagnosis of SARS-CoV-2 by FDA under an Emergency Use Authorization (EUA). This EUA will remain  in effect (meaning this test can be used) for the duration of the COVID-19 declaration under Section 564(b)(1) of the Act, 21 U.S.C. section 360bbb-3(b)(1), unless the authorization is terminated or revoked.  Performed at [facility], [address] [place], [place] [address]     Today   Subjective    Cythia Bigby today has no headache,no chest abdominal pain,no new weakness tingling or numbness, feels much better wants to go home today.    Objective   Blood pressure 109/76, pulse 71, temperature 98.4 F (36.9 C), temperature source Oral, resp. rate 15, height 5\' 3"  (1.6 m), weight 98.2 kg, SpO2 96%.   Intake/Output Summary (Last 24 hours) at 08/29/2023 0855 Last data filed at 08/28/2023 1026 Gross per 24 hour  Intake 135 ml  Output --  Net 135 ml    Exam  Awake Alert, No new F.N deficits,    [place].AT,PERRAL Supple Neck,   Symmetrical Chest wall movement, Good air movement bilaterally, CTAB RRR,No Gallops,   +ve B.Sounds, Abd Soft, Non tender,  No Cyanosis, Clubbing or edema    Data Review   Recent Labs  Lab 08/23/23 0839 08/24/23 0426 08/25/23 0310 08/26/23 0900 08/28/23 0358 08/29/23 0253  WBC 8.9 8.5 8.4 8.4 9.3 9.3  HGB 8.7* 7.6* 8.2* 7.9* 7.8* 7.8*  HCT 29.3* 25.7* 28.0* 27.3* 26.7* 26.9*  PLT 176 168 197 207 242 245  MCV 107.7* 106.6* 106.5* 107.1* 105.1* 106.7*  MCH 32.0 31.5 31.2 31.0 30.7 31.0  MCHC 29.7* 29.6* 29.3* 28.9* 29.2* 29.0*  RDW 15.5 15.4 15.5 15.4 15.3 15.3  LYMPHSABS 0.4*  --   --   --   --   --   MONOABS 0.6  --   --   --   --   --   EOSABS 0.0  --   --   --   --   --   BASOSABS 0.0  --   --   --   --   --     Recent Labs  Lab 08/23/23 0839 08/24/23 0426 08/25/23 0310 08/26/23 0900 08/28/23 0559  NA 135 134* 135 137 133*  K 3.6 3.7 3.6  3.7 4.2  CL 93* 94* 96* 98 94*  CO2 29 29 29 27 27   ANIONGAP 13 11 10 12 12   GLUCOSE 132* 126* 153* 132* 150*  BUN 36* 49* 29* 48* 56*  CREATININE 4.94* 6.08* 4.24* 6.29* 6.87*  ALBUMIN 3.2* 2.9* 3.1* 2.8*  --   CRP  --   --   --   --  1.6*  CALCIUM 9.0 9.0 9.0 9.2 9.9    Total Time in preparing paper work, data evaluation and todays exam - 35 minutes  Signature  -    Susa Raring M.D on 08/29/2023 at 8:55 AM   -  To page go to [email or web address]

## 2023-08-29 NOTE — Progress Notes (Addendum)
Patient refused to wear CPAP during this shift.

## 2023-08-29 NOTE — Plan of Care (Signed)
  Problem: Coping: Goal: Ability to adjust to condition or change in health will improve Outcome: Progressing   Problem: Metabolic: Goal: Ability to maintain appropriate glucose levels will improve Outcome: Progressing   Problem: Clinical Measurements: Goal: Ability to maintain clinical measurements within normal limits will improve Outcome: Progressing

## 2023-08-29 NOTE — Progress Notes (Signed)
[facility] Progress Note  Subjective: seen in room. No c/o's, on 3 L O2.   Vitals:   08/29/23 0820 08/29/23 0825 08/29/23 1258 08/29/23 1315  BP: 109/76 109/76 136/62 104/62  Pulse: 71 62  75  Resp: 15 18  18   Temp: 98.4 F (36.9 C) 98.4 F (36.9 C)  98.3 F (36.8 C)  TempSrc: Oral Oral  Oral  SpO2: 96% 95%  97%  Weight:      Height:        Exam: Gen alert, not in distress, 3 L [place] No jvd or bruits Chest clear bilat no ^wob RRR no RG Abd soft ntnd no mass or ascites +bs MS no joint effusions or deformity Ext no LE edema Neuro is alert, nf    LUA AVG+bruit           Renal-related home meds: - metoprolol 25 bid - home O2 3 L - eliquis 5 bid - midodrine 15 tid - lyrica 25 qam - renvela 2 ac tid    OP HD: [facility]  4h   400/600   97.5kg  2/2 bath  LUA AVG  Heparin 2000 - last OP HD 11/09, post wt 101.3kg (+4 kg) - coming off 2-4kg over the last 2 wks - hectorol 3 mcg  - mircera 100 mcg IV q 2, last 11/2, due 11/16     Assessment/ Plan: 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 BNP was up at 1345. Has not been getting to dry wt last 2 wks. Had HD 11/12 here w/ 2.5 L UF. Pt decompensated 11/14 and required bipap for poor mental status and severe hypercarbia. CXR 11/14 showed early edema and pt had HD w/ 2.5 L off w/ stable BP's. Post HD the O2 was weaned down to 4 L [place]. Since then has weaned down to 3 L which is what she wears at home. Lowest wts here were down to 95.4kg. will consider lowering dry wt on dc.  Volume - early pulm edema by CXR 11/14, improved on repeat CXR's.  IV access: very difficult, IR placed a tunneled PICC for IV access.  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 No missed HD. Had HD Monday off schedule. Next HD Thursday.  BP - on midodrine 10 tid for now. 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 Follow.  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 Cont binders.  COPD - on home O2 3 L  [place] Dispo - for dc today    Rob Arlean Hopping MD  [facility] 08/29/2023, 2:31 PM  Recent Labs  Lab 08/25/23 0310 08/26/23 0900 08/28/23 0358 08/28/23 0559 08/29/23 0253  HGB 8.2* 7.9* 7.8*  --  7.8*  ALBUMIN 3.1* 2.8*  --   --   --   CALCIUM 9.0 9.2  --  9.9  --   PHOS 2.9 3.3  --   --   --   CREATININE 4.24* 6.29*  --  6.87*  --   K 3.6 3.7  --  4.2  --    No results for input(s): "IRON", "TIBC", "FERRITIN" in the last 168 hours. Inpatient medications:  apixaban  5 mg Oral BID   arformoterol  15 mcg Nebulization BID   budesonide (PULMICORT) nebulizer solution  0.5 mg Nebulization BID   Chlorhexidine Gluconate Cloth  6 each Topical Q0600   colchicine  0.3 mg Oral Daily  diclofenac Sodium  2 g Topical QID   [START ON 08/30/2023] heparin  2,000 Units Dialysis Once in dialysis   heparin sodium (porcine)  1,500 Units Intravenous Once   heparin sodium (porcine)  2,000 Units Intravenous Once   insulin aspart  0-5 Units Subcutaneous QHS   insulin aspart  0-9 Units Subcutaneous TID WC   leptospermum manuka honey  1 Application Topical Daily   levothyroxine  175 mcg Oral Q0600   metoprolol tartrate  25 mg Oral BID   midodrine  10 mg Oral Q8H   pantoprazole  40 mg Oral BID AC   revefenacin  175 mcg Nebulization Daily   rosuvastatin  20 mg Oral Daily   sevelamer carbonate  1,600 mg Oral TID WC   sodium chloride flush  10-40 mL Intracatheter Q12H    anticoagulant sodium citrate       acetaminophen **OR** acetaminophen, albuterol, alteplase, anticoagulant sodium citrate, feeding supplement (NEPRO CARB STEADY), heparin, [START ON 08/30/2023] heparin, hydrALAZINE, hydrOXYzine, lidocaine (PF), lidocaine-prilocaine, meclizine, pentafluoroprop-tetrafluoroeth

## 2023-08-29 NOTE — Plan of Care (Signed)
  Problem: Education: Goal: Ability to describe self-care measures that may prevent or decrease complications (Diabetes Survival Skills Education) will improve Outcome: Completed/Met Goal: Individualized Educational Video(s) Outcome: Completed/Met   Problem: Coping: Goal: Ability to adjust to condition or change in health will improve Outcome: Completed/Met   Problem: Fluid Volume: Goal: Ability to maintain a balanced intake and output will improve Outcome: Completed/Met   Problem: Health Behavior/Discharge Planning: Goal: Ability to identify and utilize available resources and services will improve Outcome: Completed/Met Goal: Ability to manage health-related needs will improve Outcome: Completed/Met   Problem: Metabolic: Goal: Ability to maintain appropriate glucose levels will improve Outcome: Completed/Met   Problem: Skin Integrity: Goal: Risk for impaired skin integrity will decrease Outcome: Completed/Met   Problem: Tissue Perfusion: Goal: Adequacy of tissue perfusion will improve Outcome: Completed/Met   Problem: Education: Goal: Knowledge of General Education information will improve Description: Including pain rating scale, medication(s)/side effects and non-pharmacologic comfort measures Outcome: Completed/Met   Problem: Health Behavior/Discharge Planning: Goal: Ability to manage health-related needs will improve Outcome: Completed/Met   Problem: Clinical Measurements: Goal: Ability to maintain clinical measurements within normal limits will improve Outcome: Completed/Met Goal: Will remain free from infection Outcome: Completed/Met Goal: Diagnostic test results will improve Outcome: Completed/Met Goal: Respiratory complications will improve Outcome: Completed/Met Goal: Cardiovascular complication will be avoided Outcome: Completed/Met   Problem: Activity: Goal: Risk for activity intolerance will decrease Outcome: Completed/Met   Problem:  Nutrition: Goal: Adequate nutrition will be maintained Outcome: Completed/Met   Problem: Coping: Goal: Level of anxiety will decrease Outcome: Completed/Met   Problem: Pain Management: Goal: General experience of comfort will improve Outcome: Completed/Met   Problem: Safety: Goal: Ability to remain free from injury will improve Outcome: Completed/Met

## 2023-08-29 NOTE — TOC Transition Note (Signed)
Transition of Care [facility]) - CM/SW Discharge Note   Patient Details  Name: [name] [name] MRN: [id number] Date of Birth: 02/22/1951  Transition of [facility]) CM/SW Contact:  Gordy Clement, RN Phone Number: 08/29/2023, 11:42 AM   Clinical Narrative:   Patient to DC to home today via PTAR. Scheduled for & pm pick up due to HD Patient is non ambulatory and O2 dependent. Lives with Son who is a caretaker. Patient had a 3 hour per day PCS aide that quit while Patient was hospitalized. While this RNCM in room, Son is making calls to get a replacement thru insurance. Son also called family member for assistance in the interim until a replacement could be established.  Patient is on O2 and has a portable in room. BiPap  also delivered to room from [employer]. Patient should have HD prior to dcing today. Son arranges patient transportation to HD from home. [facility] will provide services  SN PT OT     Final next level of care: Home w Home Health Services Barriers to Discharge: Continued Medical Work up   Patient Goals and CMS Choice      Discharge Placement                         Discharge Plan and Services Additional resources added to the After Visit Summary for     Discharge Planning Services: CM Consult Post Acute Care Choice: Home Health, Durable Medical Equipment          DME Arranged: Bipap DME Agency: [employer] Date DME Agency Contacted: 08/23/23 Time DME Agency Contacted: 1348 Representative spoke with at DME Agency: Macon Large HH Arranged: RN, PT, OT [facility] Agency: [facility] Date [facility] Agency Contacted: 08/23/23 Time HH Agency Contacted: [phone] Representative spoke with at [facility] Agency: Kandee Keen  Social Determinants of Health (SDOH) Interventions SDOH Screenings   Food Insecurity: No Food Insecurity (08/20/2023)  Housing: Patient Declined (08/20/2023)  Transportation Needs: No Transportation Needs (08/20/2023)  Utilities: Not At Risk (08/20/2023)   Tobacco Use: Medium Risk (08/28/2023)     Readmission Risk Interventions    08/21/2023    3:23 PM 05/09/2023    3:37 PM 05/08/2023   12:17 PM  Readmission Risk Prevention Plan  Transportation Screening Complete Complete Complete  Medication Review (RN Care Manager) Referral to Pharmacy Complete Complete  PCP or Specialist appointment within 3-5 days of discharge Complete Complete Complete  HRI or Home Care Consult Complete Complete Complete  SW Recovery Care/Counseling Consult Complete Complete Complete  Palliative Care Screening Not Applicable Not Applicable Not Applicable  Skilled Nursing Facility Not Applicable Complete Complete

## 2023-08-29 NOTE — Plan of Care (Signed)
  Problem: Education: Goal: Ability to describe self-care measures that may prevent or decrease complications (Diabetes Survival Skills Education) will improve Outcome: Progressing Goal: Individualized Educational Video(s) Outcome: Progressing   Problem: Coping: Goal: Ability to adjust to condition or change in health will improve Outcome: Progressing   Problem: Fluid Volume: Goal: Ability to maintain a balanced intake and output will improve Outcome: Progressing   Problem: Health Behavior/Discharge Planning: Goal: Ability to manage health-related needs will improve Outcome: Progressing   Problem: Metabolic: Goal: Ability to maintain appropriate glucose levels will improve Outcome: Progressing   Problem: Skin Integrity: Goal: Risk for impaired skin integrity will decrease Outcome: Progressing

## 2023-08-30 ENCOUNTER — Other Ambulatory Visit (HOSPITAL_COMMUNITY): Payer: 59

## 2023-08-30 DIAGNOSIS — J9602 Acute respiratory failure with hypercapnia: Secondary | ICD-10-CM | POA: Diagnosis not present

## 2023-08-30 LAB — CBC
HCT: 29.4 % — ABNORMAL LOW (ref 36.0–46.0)
Hemoglobin: 8.6 g/dL — ABNORMAL LOW (ref 12.0–15.0)
MCH: 31 pg (ref 26.0–34.0)
MCHC: 29.3 g/dL — ABNORMAL LOW (ref 30.0–36.0)
MCV: 106.1 fL — ABNORMAL HIGH (ref 80.0–100.0)
Platelets: 250 10*3/uL (ref 150–400)
RBC: 2.77 MIL/uL — ABNORMAL LOW (ref 3.87–5.11)
RDW: 15.2 % (ref 11.5–15.5)
WBC: 9.4 10*3/uL (ref 4.0–10.5)
nRBC: 0.6 % — ABNORMAL HIGH (ref 0.0–0.2)

## 2023-08-30 LAB — GLUCOSE, CAPILLARY: Glucose-Capillary: 123 mg/dL — ABNORMAL HIGH (ref 70–99)

## 2023-08-30 NOTE — TOC Transition Note (Signed)
Transition of Care [facility]) - CM/SW Discharge Note   Patient Details  Name: [name] [name] MRN: [id number] Date of Birth: 08-02-1951  Transition of Care [facility]) CM/SW Contact:  Durenda Guthrie, RN Phone Number: 08/30/2023, 10:11 AM   Clinical Narrative:    Case manager has scheduled PTAR transport for patient's discharge home. Medical Necessity form completed and on chart, bedside RN notified as well as patient.   Final next level of care: Home w Home Health Services Barriers to Discharge: Continued Medical Work up   Patient Goals and CMS Choice      Discharge Placement                         Discharge Plan and Services Additional resources added to the After Visit Summary for     Discharge Planning Services: CM Consult Post Acute Care Choice: Home Health, Durable Medical Equipment          DME Arranged: Bipap DME Agency: [employer] Date DME Agency Contacted: 08/23/23 Time DME Agency Contacted: 1348 Representative spoke with at DME Agency: Macon Large HH Arranged: RN, PT, OT [facility] Agency: [facility] Date [facility] Agency Contacted: 08/23/23 Time HH Agency Contacted: [phone] Representative spoke with at [facility] Agency: Kandee Keen  Social Determinants of Health (SDOH) Interventions SDOH Screenings   Food Insecurity: No Food Insecurity (08/20/2023)  Housing: Patient Declined (08/20/2023)  Transportation Needs: No Transportation Needs (08/20/2023)  Utilities: Not At Risk (08/20/2023)  Tobacco Use: Medium Risk (08/28/2023)     Readmission Risk Interventions    08/21/2023    3:23 PM 05/09/2023    3:37 PM 05/08/2023   12:17 PM  Readmission Risk Prevention Plan  Transportation Screening Complete Complete Complete  Medication Review (RN Care Manager) Referral to Pharmacy Complete Complete  PCP or Specialist appointment within 3-5 days of discharge Complete Complete Complete  HRI or Home Care Consult Complete Complete Complete  SW Recovery Care/Counseling Consult  Complete Complete Complete  Palliative Care Screening Not Applicable Not Applicable Not Applicable  Skilled Nursing Facility Not Applicable Complete Complete

## 2023-08-30 NOTE — Discharge Planning (Signed)
[facility] Patient Discharge Orders- [facility] CLINIC: [place]  Patient's name: CALENA HULTS Admit/DC Dates: 08/20/2023 - 08/30/2023  Discharge Diagnoses: Acute on chronic respiratory failure     Aranesp: Given: no   Date and amount of last dose: N/A  Last Hgb: 8.6 PRBC's Given: mo Date/# of units: N/A ESA dose for discharge: mircera 100 mcg IV q 2 weeks  IV Iron dose at discharge: none  Heparin change: no  EDW Change: Yes New EDW: 96.5kg   [place] Change: no  Access intervention/Change: no Details:  Hectorol/Calcitriol change: Yes- hold hectorol d/t hypercalcemia  Discharge Labs: Calcium 9.9 Phosphorus 3.3 Albumin 2.8 K+ 4.2  IV Antibiotics: no Details:  On Coumadin?: no Last INR: Next INR: Managed By:   OTHER/APPTS/LAB ORDERS:    D/C Meds to be reconciled by nurse after every discharge.  Completed By: Rogers Blocker, PA-C 08/30/2023, 11:25 AM  [facility] Pager: [phone]    Reviewed by: MD:______ RN_______

## 2023-08-30 NOTE — Plan of Care (Signed)
[facility]                                                                                                                                                                         Patient Demographics  [name] [name], is a 72 y.o. female  [id number]:[id number]  [id number]  DOB - 09-Feb-1951  Admit date - 08/20/2023  Admitting Physician Lorin Glass, MD  Outpatient Primary MD for the patient is [name], [name], FNP  LOS - 10   Chief Complaint  Patient presents with   Respiratory Distress        Assessment & Plan    Patient seen briefly today due for discharge soon per Discharge done yesterday by me, no further issues, Vital signs stable, patient feels fine.      Medications  Scheduled Meds:  apixaban  5 mg Oral BID   arformoterol  15 mcg Nebulization BID   budesonide (PULMICORT) nebulizer solution  0.5 mg Nebulization BID   Chlorhexidine Gluconate Cloth  6 each Topical Q0600   colchicine  0.3 mg Oral Daily   diclofenac Sodium  2 g Topical QID   heparin sodium (porcine)  1,500 Units Intravenous Once   insulin aspart  0-5 Units Subcutaneous QHS   insulin aspart  0-9 Units Subcutaneous TID WC   leptospermum manuka honey  1 Application Topical Daily   levothyroxine  175 mcg Oral Q0600   metoprolol tartrate  25 mg Oral BID   midodrine  10 mg Oral Q8H   pantoprazole  40 mg Oral BID AC   revefenacin  175 mcg Nebulization Daily   rosuvastatin  20 mg Oral Daily   sevelamer carbonate  1,600 mg Oral TID WC   sodium chloride flush  10-40 mL Intracatheter Q12H   Continuous Infusions: PRN Meds:.acetaminophen **OR** acetaminophen, albuterol, hydrALAZINE, hydrOXYzine, meclizine    Time Spent in minutes   10 minutes   Susa Raring M.D on 08/30/2023 at 7:30 AM  Between 7am to 7pm - Pager - [phone]  After 7pm go to [email or web address] - password [email or web address]  And look for the night coverage person covering  for me after hours  [facility] Office  [phone]    Subjective:   [name] today has, No headache, No chest pain, No abdominal pain - No Nausea, No new weakness tingling or numbness, No Cough - SOB.    Objective:   Vitals:   08/29/23 2350 08/30/23 0000 08/30/23 0400 08/30/23 0500  BP: (!) 101/56 (!) 107/44 (!) 113/43   Pulse:  67 63   Resp: (!) 24 19 16    Temp:  98.9 F (37.2 C) 99.4 F (37.4 C)   TempSrc:  Oral Oral   SpO2:  96% 99%  Weight:    96.4 kg  Height:        Wt Readings from Last 3 Encounters:  08/30/23 96.4 kg  08/19/23 99.8 kg  07/20/23 98.2 kg     Intake/Output Summary (Last 24 hours) at 08/30/2023 0730 Last data filed at 08/29/2023 1807 Gross per 24 hour  Intake 10 ml  Output 3000 ml  Net -2990 ml    Exam  Awake Alert, No new F.N deficits, Normal affect [place].AT,PERRAL Supple Neck, No JVD,   Symmetrical Chest wall movement, Good air movement bilaterally, CTAB RRR,No Gallops, Rubs or new Murmurs,  +ve B.Sounds, Abd Soft, No tenderness,   No Cyanosis, Clubbing or edema   Data Review

## 2023-08-30 NOTE — Progress Notes (Signed)
D/C order noted. Contacted [facility] to advise clinic of pt's d/c today and that pt should resume care tomorrow.   Olivia Canter Renal Navigator [phone]

## 2023-08-30 NOTE — Progress Notes (Signed)
[facility]                                                                                                                                                                         Patient Demographics  [name] [name], is a 72 y.o. female  WUJ:[id number]  NFA:[id number]  DOB - [birth year]  Admit date - 08/20/2023  Admitting Physician Lorin Glass, MD  Outpatient Primary MD for the patient is [name], [name], FNP  LOS - 10   Chief Complaint  Patient presents with   Respiratory Distress        Assessment & Plan    Patient seen briefly today due for discharge soon per Discharge done yesterday by me, no further issues, Vital signs stable, patient feels fine.      Medications  Scheduled Meds:  apixaban  5 mg Oral BID   arformoterol  15 mcg Nebulization BID   budesonide (PULMICORT) nebulizer solution  0.5 mg Nebulization BID   Chlorhexidine Gluconate Cloth  6 each Topical Q0600   colchicine  0.3 mg Oral Daily   diclofenac Sodium  2 g Topical QID   heparin sodium (porcine)  1,500 Units Intravenous Once   insulin aspart  0-5 Units Subcutaneous QHS   insulin aspart  0-9 Units Subcutaneous TID WC   leptospermum manuka honey  1 Application Topical Daily   levothyroxine  175 mcg Oral Q0600   metoprolol tartrate  25 mg Oral BID   midodrine  10 mg Oral Q8H   pantoprazole  40 mg Oral BID AC   revefenacin  175 mcg Nebulization Daily   rosuvastatin  20 mg Oral Daily   sevelamer carbonate  1,600 mg Oral TID WC   sodium chloride flush  10-40 mL Intracatheter Q12H   Continuous Infusions: PRN Meds:.acetaminophen **OR** acetaminophen, albuterol, hydrALAZINE, hydrOXYzine, meclizine    Time Spent in minutes   10 minutes   Susa Raring M.D on 08/30/2023 at 9:54 AM  Between 7am to 7pm - Pager - [phone]  After 7pm go to [email or web address] - password [email or web address]  And look for the night coverage person covering  for me after hours  [facility] Office  [phone]    Subjective:   [name] [name] today has, No headache, No chest pain, No abdominal pain - No Nausea, No new weakness tingling or numbness, No Cough - SOB.    Objective:   Vitals:   08/30/23 0500 08/30/23 0757 08/30/23 0758 08/30/23 0810  BP:    (!) 123/47  Pulse:   (!) 57 (!) 51  Resp:   15 18  Temp:    98.4 F (36.9 C)  TempSrc:    Oral  SpO2:  99% 97% 100%  Weight: 96.4  kg     Height:        Wt Readings from Last 3 Encounters:  08/30/23 96.4 kg  08/19/23 99.8 kg  07/20/23 98.2 kg     Intake/Output Summary (Last 24 hours) at 08/30/2023 0954 Last data filed at 08/29/2023 1807 Gross per 24 hour  Intake --  Output 3000 ml  Net -3000 ml    Exam  Awake Alert, No new F.N deficits, Normal affect [place].AT,PERRAL Supple Neck, No JVD,   Symmetrical Chest wall movement, Good air movement bilaterally, CTAB RRR,No Gallops, Rubs or new Murmurs,  +ve B.Sounds, Abd Soft, No tenderness,   No Cyanosis, Clubbing or edema   Data Review

## 2023-08-31 ENCOUNTER — Telehealth: Payer: Self-pay | Admitting: Physician Assistant

## 2023-08-31 NOTE — Telephone Encounter (Signed)
Transition of care contact from inpatient facility  Date of Discharge: 08/30/23 Date of Contact: 08/31/23 Method of contact: Phone  Attempted to contact patient to discuss transition of care from inpatient admission. Patient did not answer the phone. Message was left on the patient's voicemail with call back instructions.  Rogers Blocker, PA-C 08/31/2023, 10:19 AM  [facility] Pager: [phone]

## 2023-09-01 ENCOUNTER — Encounter (HOSPITAL_COMMUNITY): Payer: Self-pay | Admitting: Gastroenterology

## 2023-09-01 ENCOUNTER — Telehealth: Payer: Self-pay | Admitting: Physician Assistant

## 2023-09-01 NOTE — Telephone Encounter (Signed)
Transition of care contact from inpatient facility- Second call attempt  Date of Discharge: 08/30/23 Date of Contact: 09/01/23 Method of contact: Phone  Attempted to contact patient to discuss transition of care from inpatient admission. Patient did not answer the phone. Message was left on the patient's voicemail with call back instructions. We will follow up with patient at her outpatient dialysis center  [facility], PA-C 09/01/2023, 11:38 AM  [facility] Pager: [phone]

## 2023-09-02 ENCOUNTER — Ambulatory Visit: Payer: No Typology Code available for payment source | Attending: Nurse Practitioner | Admitting: Nurse Practitioner

## 2023-09-02 NOTE — Progress Notes (Deleted)
Office Visit    Patient Name: [name] [name] Date of Encounter: 09/02/2023  Primary Care Provider:  Vladimir Crofts, FNP Primary Cardiologist:  Nanetta Batty, MD  Chief Complaint    72 year old female with a history of nonobstructive CAD, chronic combined systolic and diastolic heart failure, NICM, paroxysmal atrial fibrillation, PSVT, aortic stenosis, hypertension, hyperlipidemia, type 2 diabetes, ESRD on HD TTS, hypothyroidism, PVD, prior TIA, anemia, OSA, COPD, non-small cell lung cancer s/p curative radiation and obesity who presents for follow-up related to CAD, heart failure, and atrial fibrillation.   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   Full dentures    GERD (gastroesophageal reflux disease)    Gout    History of hiatal hernia    History of transfusion    Hx MRSA infection    abscess left groin   Hyperlipidemia    Hypertension    Hypothyroid    Intractable nausea and vomiting 02/15/2021   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HCC)    Peripheral vascular disease (HCC)    Sleep apnea    UNABLE TO TOLERATE C PAP   Stress incontinence    TIA (transient ischemic attack)     X2 NO RESIDUAL PROBLEMS   Wears glasses    Past Surgical History:  Procedure Laterality Date   ABDOMINAL HYSTERECTOMY     AV FISTULA PLACEMENT Left 12/01/2021   Procedure: INSERTION OF LEFT ARM ARTERIOVENOUS (AV) GORE-TEX GRAFT;  Surgeon: Nada Libman, MD;  Location: [facility] OR;  Service: Vascular;  Laterality: Left;   BACK SURGERY  2020   BASCILIC VEIN TRANSPOSITION Left 04/05/2020   Procedure:  BASILIC VEIN TRANSPOSITION FIRST STAGE;  Surgeon: Chuck Hint, MD;  Location: [facility] OR;  Service: Vascular;  Laterality: Left;   BIOPSY  08/28/2023   Procedure: BIOPSY;  Surgeon: Kerin Salen, MD;  Location: [facility] ENDOSCOPY;  Service: Gastroenterology;;   BOTOX INJECTION  08/28/2023   Procedure: BOTOX INJECTION;  Surgeon: Kerin Salen, MD;  Location: [facility] ENDOSCOPY;  Service: Gastroenterology;;   BRONCHIAL BIOPSY  01/23/2022   Procedure: BRONCHIAL BIOPSIES;  Surgeon: Josephine Igo, DO;  Location: [facility] ENDOSCOPY;  Service: Pulmonary;;   BRONCHIAL BIOPSY  08/07/2022   Procedure: BRONCHIAL BIOPSIES;  Surgeon: Josephine Igo, DO;  Location: [facility] ENDOSCOPY;  Service: Pulmonary;;   BRONCHIAL BRUSHINGS  08/07/2022   Procedure: BRONCHIAL BRUSHINGS;  Surgeon: Josephine Igo, DO;  Location: [facility] ENDOSCOPY;  Service: Pulmonary;;   BRONCHIAL NEEDLE ASPIRATION BIOPSY  01/23/2022   Procedure: BRONCHIAL NEEDLE ASPIRATION BIOPSIES;  Surgeon: Josephine Igo, DO;  Location: [facility] ENDOSCOPY;  Service: Pulmonary;;   BRONCHIAL NEEDLE ASPIRATION BIOPSY  08/07/2022   Procedure: BRONCHIAL NEEDLE ASPIRATION BIOPSIES;  Surgeon: Josephine Igo, DO;  Location: [facility] ENDOSCOPY;  Service: Pulmonary;;   COLON SURGERY  1995   DUE TO POLYP   DILATION AND CURETTAGE OF UTERUS     ENDOBRONCHIAL ULTRASOUND  01/23/2022   Procedure: ENDOBRONCHIAL ULTRASOUND;  Surgeon: Josephine Igo, DO;  Location: [facility] ENDOSCOPY;  Service: Pulmonary;;   ENTEROSCOPY N/A 07/10/2021   Procedure: ENTEROSCOPY;  Surgeon: Kerin Salen, MD;  Location: [facility] ENDOSCOPY;  Service: Gastroenterology;  Laterality: N/A;   ESOPHAGEAL DILATION  08/28/2023   Procedure: ESOPHAGEAL BALLOON  DILATION;  Surgeon: Kerin Salen, MD;  Location: [facility] ENDOSCOPY;  Service: Gastroenterology;;   ESOPHAGOGASTRODUODENOSCOPY N/A 01/25/2021   Procedure: ESOPHAGOGASTRODUODENOSCOPY (EGD);  Surgeon: Vida Rigger, MD;  Location: [facility] ENDOSCOPY;  Service: Endoscopy;  Laterality: N/A;    ESOPHAGOGASTRODUODENOSCOPY (EGD) WITH PROPOFOL N/A 08/28/2023   Procedure: ESOPHAGOGASTRODUODENOSCOPY (EGD) WITH PROPOFOL;  Surgeon: Kerin Salen, MD;  Location: [facility] ENDOSCOPY;  Service: Gastroenterology;  Laterality: N/A;   FINE NEEDLE ASPIRATION  01/23/2022   Procedure: FINE NEEDLE ASPIRATION (FNA) LINEAR;  Surgeon: Josephine Igo, DO;  Location: [facility] ENDOSCOPY;  Service: Pulmonary;;   FOREARM FRACTURE SURGERY     Left arm   GIVENS CAPSULE STUDY N/A 07/07/2021   Procedure: GIVENS CAPSULE STUDY;  Surgeon: Vida Rigger, MD;  Location: [facility] ENDOSCOPY;  Service: Endoscopy;  Laterality: N/A;   HERNIA REPAIR     w/ mesh   HOT HEMOSTASIS N/A 07/10/2021   Procedure: HOT HEMOSTASIS (ARGON PLASMA COAGULATION/BICAP);  Surgeon: Kerin Salen, MD;  Location: [facility] ENDOSCOPY;  Service: Gastroenterology;  Laterality: N/A;   INCISION AND DRAINAGE ABSCESS Left 02/04/2013   Procedure: INCISION AND DRAINAGE LEFT BUTTOCK ABSCESS; INCISION AND DRAINAGE LEFT BREAST ABSCESS;  Surgeon: Shelly Rubenstein, MD;  Location: [facility] OR;  Service: General;  Laterality: Left;   INCISION AND DRAINAGE ABSCESS N/A 02/12/2013   Procedure: INCISION AND DEBRIDEMENT BUTTOCK WOUND ;  Surgeon: Wilmon Arms. Corliss Skains, MD;  Location: [facility] OR;  Service: General;  Laterality: N/A;   INCISION AND DRAINAGE ABSCESS N/A 02/14/2013   Procedure: INCISION AND DRAINAGE/DRESSING CHANGE;  Surgeon: Shelly Rubenstein, MD;  Location: [facility] OR;  Service: General;  Laterality: N/A;   INCISION AND DRAINAGE ABSCESS N/A 03/21/2015   Procedure: INCISION AND DRAINAGE PUBIC ABSCESS;  Surgeon: Glenna Fellows, MD;  Location: [facility] ORS;  Service: General;  Laterality: N/A;   INCISION AND DRAINAGE PERIRECTAL ABSCESS Left 02/10/2013   Procedure: IRRIGATION AND DEBRIDEMENT OF BUTTOCK/PERINEAL ABSCESS;  Surgeon: Wilmon Arms. Corliss Skains, MD;  Location: [facility] OR;  Service: General;  Laterality: Left;   INCISION AND DRAINAGE PERIRECTAL ABSCESS N/A 02/16/2013   Procedure: IRRIGATION AND DEBRIDEMENT PERINEAL  ABSCESS;  Surgeon: Liz Malady, MD;  Location: [facility] OR;  Service: General;  Laterality: N/A;   IR FLUORO GUIDE CV LINE RIGHT  11/30/2021   IR FLUORO GUIDE CV LINE RIGHT  08/22/2023   IR REMOVAL TUN CV CATH W/O FL  08/29/2023   IR US GUIDE VASC ACCESS RIGHT  11/30/2021   IR US GUIDE VASC ACCESS RIGHT  08/22/2023   IRRIGATION AND DEBRIDEMENT ABSCESS N/A 02/06/2013   Procedure: IRRIGATION AND DEBRIDEMENT BUTTOCK ABSCESS AND DRESSING CHANGE;  Surgeon: Shelly Rubenstein, MD;  [facility]: [facility];  Service: General;  Laterality: N/A;   IRRIGATION AND DEBRIDEMENT ABSCESS Left 02/08/2013   Procedure: IRRIGATION AND DEBRIDEMENT ABSCESS/DRESSING CHANGE;  Surgeon: Cherylynn Ridges, MD;  Location: [facility] OR;  Service: General;  Laterality: Left;   JOINT REPLACEMENT  2010 / 2012    LAPAROSCOPIC CHOLECYSTECTOMY     LEFT HEART CATH AND CORONARY ANGIOGRAPHY N/A 07/05/2023   Procedure: LEFT HEART CATH AND CORONARY ANGIOGRAPHY;  Surgeon: Lennette Bihari, MD;  [facility]: [facility] INVASIVE CV LAB;  Service: Cardiovascular;  Laterality: N/A;   LEFT HEART CATHETERIZATION WITH CORONARY ANGIOGRAM N/A 07/02/2014   Procedure: LEFT HEART  CATHETERIZATION WITH CORONARY ANGIOGRAM;  Surgeon: Runell Gess, MD;  Location: [facility] CATH LAB;  Service: Cardiovascular;  Laterality: N/A;   LOWER EXTREMITY ANGIOGRAM Right 04/20/2016   Procedure: Lower Extremity Angiogram;  Surgeon: Sherren Kerns, MD;  Location: [facility] INVASIVE CV LAB;  Service: Cardiovascular;  Laterality: Right;   MULTIPLE TOOTH EXTRACTIONS     PERIPHERAL VASCULAR CATHETERIZATION N/A 04/20/2016   Procedure: Abdominal Aortogram;  Surgeon: Sherren Kerns, MD;  Location: [facility] INVASIVE CV LAB;  Service: Cardiovascular;  Laterality: N/A;   PERIPHERAL VASCULAR CATHETERIZATION Right 04/20/2016   Procedure: Peripheral Vascular Intervention;  Surgeon: Sherren Kerns, MD;  Location: [facility] INVASIVE CV LAB;  Service: Cardiovascular;  Laterality: Right;  popiteal   RIGHT HEART CATH N/A 10/27/2018    Procedure: RIGHT HEART CATH;  Surgeon: Laurey Morale, MD;  Location: [facility] INVASIVE CV LAB;  Service: Cardiovascular;  Laterality: N/A;   RIGHT HEART CATH N/A 03/20/2019   Procedure: RIGHT HEART CATH;  Surgeon: Laurey Morale, MD;  Location: [facility] INVASIVE CV LAB;  Service: Cardiovascular;  Laterality: N/A;   THYROIDECTOMY     TOTAL HIP REVISION Right 08/11/2014   Procedure: RIGHT ACETABULAR REVISION;  Surgeon: Loanne Drilling, MD;  Location: [facility] ORS;  Service: Orthopedics;  Laterality: Right;   VASCULAR SURGERY     VIDEO BRONCHOSCOPY WITH ENDOBRONCHIAL ULTRASOUND Bilateral 08/07/2022   Procedure: VIDEO BRONCHOSCOPY WITH ENDOBRONCHIAL ULTRASOUND;  Surgeon: Josephine Igo, DO;  Location: [facility] ENDOSCOPY;  Service: Pulmonary;  Laterality: Bilateral;   VIDEO BRONCHOSCOPY WITH RADIAL ENDOBRONCHIAL ULTRASOUND  01/23/2022   Procedure: RADIAL ENDOBRONCHIAL ULTRASOUND;  Surgeon: Josephine Igo, DO;  Location: [facility] ENDOSCOPY;  Service: Pulmonary;;    Allergies  Allergies  Allergen Reactions   Nebivolol Swelling    Chest pain (reaction to Bystolic)   Zestril [Lisinopril] Swelling and Other (See Comments)   Ace Inhibitors Swelling and Other (See Comments)    Tongue swell   Morphine And Codeine Itching     Labs/Other Studies Reviewed    The following studies were reviewed today:  Cardiac Studies & Procedures   CARDIAC CATHETERIZATION  CARDIAC CATHETERIZATION 07/05/2023  Narrative   Prox Cx to Mid Cx lesion is 40% stenosed.   Mid Cx lesion is 30% stenosed.   Dist LAD lesion is 10% stenosed.   Recommend Aspirin 81mg  daily for moderate CAD.  There is significant coronary calcification involving the LAD and left circumflex vessel.  Nonobstructive CAD with smooth 10% mid LAD narrowings and left circumflex calcification with 40% proximal stenosis and smooth 30% mid AV groove stenosis.  Normal nondominant RCA.  RECOMMENDATION: Medical therapy for mild nonobstructive CAD.  Guideline directed  therapy with reduced LV function in this patient with end-stage renal disease.  Since the patient does not have anyone at home, she will stay overnight for observation.  Plans are for dialysis tomorrow and apparently it has been arranged that she would be picked up to leave the hospital in early a.m.and taken for her dialysis.  Findings Coronary Findings Diagnostic  Dominance: Right  Left Anterior Descending Dist LAD lesion is 10% stenosed.  Left Circumflex Vessel is small. Prox Cx to Mid Cx lesion is 40% stenosed. Mid Cx lesion is 30% stenosed.  Fourth Obtuse Marginal Branch Vessel is small in size.  Intervention  No interventions have been documented.   CARDIAC CATHETERIZATION  CARDIAC CATHETERIZATION 03/20/2019  Narrative Low filling pressures, no significant CHF.  Findings Coronary Findings Diagnostic  Dominance: Right  No diagnostic findings have  been documented. Intervention  No interventions have been documented.   STRESS TESTS  NM PET CT CARDIAC PERFUSION MULTI W/ABSOLUTE BLOODFLOW 06/05/2023  Narrative   Findings are consistent with a small area of infarction with peri-infarct ischemia in the inferior wall. This study is high risk. Consider cardiac catheterization given reduced myocardial blood flow reserve, severe coronary calcifications, reduced EF with no augmentation with stress, and perfusion defect.   LV perfusion is abnormal. Defect 1: There is a small defect with mild reduction in uptake present in the apical to mid inferior location(s) that is partially reversible. There is abnormal wall motion in the defect area. Consistent with infarction and peri-infarct ischemia.   Rest left ventricular function is abnormal. Rest global function is severely reduced. Rest EF: 25%. Stress left ventricular function is abnormal. Stress global function is severely reduced. Stress EF: 28%. Ejection fraction may be inaccurately gated in the setting frequent ectopy. End  diastolic cavity size is severely enlarged. End systolic cavity size is severely enlarged. No evidence of transient ischemic dilation (TID) noted.   Myocardial blood flow was computed to be 0.63ml/g/min at rest and 1.34ml/g/min at stress. Global myocardial blood flow reserve was 1.54 and was abnormal. Challenging to accurately interpret MBFR in the setting of ESRD.   Coronary calcium was present on the attenuation correction CT images. Severe coronary calcifications were present. Coronary calcifications were present in the left anterior descending artery and left circumflex artery distribution(s).   Electronically signed by: Parke Poisson, MD  CLINICAL DATA:  This over-read does not include interpretation of cardiac or coronary anatomy or pathology. The Cardiac PET CT interpretation by the cardiologist is attached. Patient with known left lower lobe lung cancer. * Tracking Code: BO *  COMPARISON:  CT chest angiogram, 05/06/2023, CT chest, 11/09/2022  FINDINGS: Cardiovascular: Aortic atherosclerosis. Cardiomegaly. Left coronary artery calcifications. No pericardial effusion.  Limited Mediastinum/Nodes: Prevascular lymph node unchanged compared to recent prior CT, although significantly enlarged compared to prior examination dated 11/09/2022 measuring 1.9 x 1.3 cm (series 3, image 9) trachea and esophagus demonstrate no significant findings.  Limited Lungs/Pleura: Moderate left pleural effusion and near complete atelectasis or consolidation of the left lower lobe, similar to recent prior examination although markedly increased compared to more remote examination dated 11/09/2022. Trace right pleural effusion. Unchanged, partially imaged fat herniation or pleural lipoma of the anterior right hemithorax, unchanged compared to prior examination dated 11/09/2022  Upper Abdomen: No acute abnormality.  Musculoskeletal: No acute osseous findings. Subacute to chronic, partially callused  bilateral anterior rib fractures.  IMPRESSION: 1. Moderate left pleural effusion and near complete atelectasis or consolidation of the left lower lobe, similar to recent prior examination although markedly increased compared to more remote examination dated 11/09/2022. This may reflect post treatment appearance of known malignancy; assessment for residual or recurrent disease very limited on this non tailored examination. Attention on oncologic follow-up. Note that metabolic activity of lung malignancy is not meaningfully assessed by rubidium PET-CT. 2. Prevascular lymph node unchanged compared to recent prior CT, although significantly enlarged compared to prior examination dated 11/09/2022 this is worrisome for nodal metastatic disease although may be reactive. 3. Cardiomegaly and coronary artery disease.  Aortic Atherosclerosis (ICD10-I70.0).   Electronically Signed By: Jearld Lesch M.D. On: 06/05/2023 15:12   ECHOCARDIOGRAM  ECHOCARDIOGRAM COMPLETE 04/15/2023  Narrative ECHOCARDIOGRAM REPORT    Patient Name:   [name] [name] Date of Exam: 04/15/2023 Medical Rec #:  [id number]  Height:       63.0 in Accession #:    [id number]        Weight:       226.6 lb Date of Birth:  1951/05/08          BSA:          2.039 m Patient Age:    72 years          BP:           118/50 mmHg Patient Gender: F                 HR:           113 bpm. Exam Location:  Inpatient  Procedure: 2D Echo, Cardiac Doppler, Color Doppler and Intracardiac Opacification Agent  Indications:    CHF-Acute Diastolic I50.31  History:        Patient has prior history of Echocardiogram examinations, most recent 11/22/2021. CHF, PAD, TIA and COPD, Signs/Symptoms:Chest Pain and Syncope; Risk Factors:Hypertension, Sleep Apnea, Diabetes, Dyslipidemia and Current Smoker. ESRD.  Sonographer:    Lucendia Herrlich Referring Phys: [id number] PRATIK D [facility]  IMPRESSIONS   1. Left ventricular ejection fraction,  by estimation, is 35 to 40%. The left ventricle has moderately decreased function. The left ventricle demonstrates global hypokinesis. The left ventricular internal cavity size was mildly dilated. Left ventricular diastolic parameters are consistent with Grade I diastolic dysfunction (impaired relaxation). 2. Right ventricular systolic function is normal. The right ventricular size is normal. There is moderately elevated pulmonary artery systolic pressure. The estimated right ventricular systolic pressure is 47.3 mmHg. 3. Left atrial size was mildly dilated. 4. The mitral valve is normal in structure. Trivial mitral valve regurgitation. No evidence of mitral stenosis. 5. The aortic valve is tricuspid. Aortic valve regurgitation is not visualized. No aortic stenosis is present. Aortic valve mean gradient measures 7.0 mmHg. 6. The inferior vena cava is dilated in size with <50% respiratory variability, suggesting right atrial pressure of 15 mmHg.  FINDINGS Left Ventricle: Left ventricular ejection fraction, by estimation, is 35 to 40%. The left ventricle has moderately decreased function. The left ventricle demonstrates global hypokinesis. Definity contrast agent was given IV to delineate the left ventricular endocardial borders. The left ventricular internal cavity size was mildly dilated. There is no left ventricular hypertrophy. Left ventricular diastolic parameters are consistent with Grade I diastolic dysfunction (impaired relaxation).  Right Ventricle: The right ventricular size is normal. No increase in right ventricular wall thickness. Right ventricular systolic function is normal. There is moderately elevated pulmonary artery systolic pressure. The tricuspid regurgitant velocity is 2.84 m/s, and with an assumed right atrial pressure of 15 mmHg, the estimated right ventricular systolic pressure is 47.3 mmHg.  Left Atrium: Left atrial size was mildly dilated.  Right Atrium: Right atrial size  was normal in size.  Pericardium: There is no evidence of pericardial effusion.  Mitral Valve: The mitral valve is normal in structure. Trivial mitral valve regurgitation. No evidence of mitral valve stenosis.  Tricuspid Valve: The tricuspid valve is normal in structure. Tricuspid valve regurgitation is trivial.  Aortic Valve: The aortic valve is tricuspid. Aortic valve regurgitation is not visualized. No aortic stenosis is present. Aortic valve mean gradient measures 7.0 mmHg. Aortic valve peak gradient measures 13.4 mmHg. Aortic valve area, by VTI measures 2.52 cm.  Pulmonic Valve: The pulmonic valve was normal in structure. Pulmonic valve regurgitation is not visualized.  Aorta: The aortic root is normal in size and structure.  Venous:  The inferior vena cava is dilated in size with less than 50% respiratory variability, suggesting right atrial pressure of 15 mmHg.  IAS/Shunts: No atrial level shunt detected by color flow Doppler.   LEFT VENTRICLE PLAX 2D LVIDd:         5.90 cm   Diastology LVIDs:         5.00 cm   LV e' medial:    6.64 cm/s LV PW:         1.10 cm   LV E/e' medial:  13.4 LV IVS:        0.90 cm   LV e' lateral:   8.05 cm/s LVOT diam:     2.00 cm   LV E/e' lateral: 11.1 LV SV:         81 LV SV Index:   40 LVOT Area:     3.14 cm   RIGHT VENTRICLE             IVC RV S prime:     12.10 cm/s  IVC diam: 2.30 cm TAPSE (M-mode): 2.1 cm  LEFT ATRIUM           Index        RIGHT ATRIUM           Index LA diam:      4.90 cm 2.40 cm/m   RA Area:     13.50 cm LA Vol (A2C): 50.1 ml 24.57 ml/m  RA Volume:   31.40 ml  15.40 ml/m LA Vol (A4C): 78.0 ml 38.25 ml/m AORTIC VALVE AV Area (Vmax):    2.48 cm AV Area (Vmean):   2.45 cm AV Area (VTI):     2.52 cm AV Vmax:           183.00 cm/s AV Vmean:          125.000 cm/s AV VTI:            0.322 m AV Peak Grad:      13.4 mmHg AV Mean Grad:      7.0 mmHg LVOT Vmax:         144.67 cm/s LVOT Vmean:        97.333  cm/s LVOT VTI:          0.258 m LVOT/AV VTI ratio: 0.80  AORTA Ao Root diam: 3.00 cm Ao Asc diam:  3.10 cm  MITRAL VALVE                TRICUSPID VALVE MV Area (PHT): 4.49 cm     TR Peak grad:   32.3 mmHg MV Decel Time: 169 msec     TR Vmax:        284.00 cm/s MV E velocity: 89.30 cm/s MV A velocity: 108.00 cm/s  SHUNTS MV E/A ratio:  0.83         Systemic VTI:  0.26 m Systemic Diam: 2.00 cm  Dalton McleanMD Electronically signed by Wilfred Lacy Signature Date/Time: 04/15/2023/5:22:34 PM    Final            Recent Labs: 07/11/2023: TSH 4.064 07/17/2023: Magnesium 2.0 08/06/2023: ALT 8 08/20/2023: B Natriuretic Peptide 1,345.3 08/28/2023: BUN 56; Creatinine, Ser 6.87; Potassium 4.2; Sodium 133 08/30/2023: Hemoglobin 8.6; Platelets 250  Recent Lipid Panel    Component Value Date/Time   CHOL 131 07/06/2023 0821   CHOL 188 12/23/2013 1132   TRIG 213 (H) 07/06/2023 0821   HDL 25 (L) 07/06/2023 0821   HDL 48 12/23/2013 1132   CHOLHDL  5.2 07/06/2023 0821   VLDL 43 (H) 07/06/2023 0821   LDLCALC 63 07/06/2023 0821   LDLCALC 119 (H) 12/23/2013 1132    History of Present Illness    72 year old female with the above past medical history including nonobstructive CAD, chronic combined systolic and diastolic heart failure, NICM, paroxysmal atrial fibrillation, PSVT, aortic stenosis, hypertension, hyperlipidemia, type 2 diabetes, ESRD on HD TTS, hypothyroidism, PVD, prior TIA, anemia, OSA, COPD, non-small cell lung cancer s/p curative radiation and obesity.   Prior cardiac catheterization in 2015 showed no significant coronary artery disease.  She has known peripheral arterial disease with prior R femoral artery stenting, follows with Dr. Darrick Penna of vascular surgery.  Echocardiogram in 11/2021 showed EF 40 to 45%, mild LVH, G1 DD, severe left atrial enlargement, mild aortic stenosis, mean gradient 8 mmHg.  She was hospitalized in July 2024 in the setting of chest pain.  Repeat  echocardiogram showed EF 35 to 40%, no new wall motion abnormalities.  Outpatient cardiac PET stress test revealed partially reversible small defect in the apical to mid anterior location, consistent with infarction and peri-infarct ischemia, EF 25%.  She underwent outpatient cardiac catheterization 07/05/2023 which revealed mild nonobstructive CAD (proximal to mid LCx 40%, mid LCx 30%, distal LAD 10% stenosed).  She was started on Crestor.  Escalation of GDMT was limited in the setting of ESRD.  Additionally, she did not tolerate BiDil due to dizziness, previously declined amlodipine.  She was hospitalized in October 2024 in the setting of acute on chronic hypoxic/hypercarbic respiratory failure secondary to acute systolic heart failure, COPD exacerbation, culture-negative septic shock.  Cardiology was consulted in the setting of new A-fib with RVR.  She was started on amiodarone and converted to NSR.  She was started on Eliquis.  CT imaging during her hospitalization showed concern for metastatic disease, follow-up with oncology was recommended.  Her follow-up visit with oncology she was noted to have a nonhealing ulcerative wound to her right forearm, felt to be the result of infiltrated IV during her recent hospitalization.  She was referred to wound care.  She was seen in the ED on 07/30/2023 in the setting of epistaxis.  Humidification of home O2 was advised.  She was last seen in the office on 08/19/2023 and was stable from a cardiac standpoint.  She noted intermittent chest discomfort, associated with stress, stable chronic dyspnea, intermittently low BP with dialysis.  She presented to the ED on 08/20/2023 in the setting of acute respiratory distress.  She was hospitalized from 08/20/2023 to 08/30/2023 in the setting of LLL pneumonia with acute hypercarbic/hypoxic respiratory failure, requiring BiPAP.  She was treated with antibiotics. She was evaluated by GI in the setting of dysphagia and underwent EGD,  dilation of esophageal stricture.  She was discharged home in stable condition on 08/30/2023 with home BiPAP.    She presents today for follow-up. Since her recent hospitalization she has   1. Nonobstructive CAD: Most recent LHC in 06/2023 revealed mild nonobstructive CAD (proximal to mid LCx 40%, mid LCx 30%, distal LAD 10% stenosed).  She notes intermittent chest discomfort when she lays down and with significant stress, she has stable chronic dyspnea in the setting of COPD. Recent cath overall reassuring.  Continue to monitor symptoms.  Continue metoprolol, Crestor.   2. 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  She has nonpitting bilateral lower extremity edema, stable chronic dyspnea.  Fluid  volume management per HD.  Continue metoprolol.   3. Paroxysmal atrial fibrillation: Newly diagnosed.  Maintaining NSR on exam.  Recent nosebleeds, recommend humidification for oxygen.  She is not an ideal candidate for long-term amiodarone therapy in the setting of COPD/lung cancer.  Will continue for now.  Continue Toprol, Eliquis.   4. Aortic stenosis: Not appreciated on most recent echo.   5. Hypertension: BP well controlled, runs low in dialysis.  Continue to monitor.  Continue current antihypertensive regimen.    6. Hyperlipidemia: LDL was 63 in 06/2023.  Recently started on Crestor.  Will update fasting lipids, LFTs in 4 to 6 weeks. Continue Crestor.    7. Type 2 diabetes: A1c was 7.1 in 04/2023.  Monitored and managed per PCP.   8. ESRD: On HD.  Follows with nephrology.   9. Hypothyroidism: TSH was 4.630 in 07/2023.  Continue to monitor with new amiodarone.   10. History of TIA: No recurrence.  On Eliquis.   11. PVD: S/p prior R femoral artery stenting, follows with Dr. Darrick Penna of vascular surgery.   12. OSA: Not on CPAP.   13. COPD: On chronic home O2, 2L.  Follows with pulmonology.   14.  Lung cancer with concern for metastases: Following with oncology.   15 Disposition: Follow-up  Home Medications    Current Outpatient Medications  Medication Sig Dispense Refill   acetaminophen (TYLENOL) 500 MG tablet Take 1 tablet (500 mg total) by mouth See admin instructions. Take 1,000mg  (2 tablets) by mouth twice a day in addition to 1,000mg  (2 tablets) twice daily as needed for pain. (Patient taking differently: Take 1,000 mg by mouth in the morning and at bedtime.)     albuterol (PROVENTIL) (2.5 MG/3ML) 0.083% nebulizer solution Take 3 mLs (2.5 mg total) by nebulization every 6 (six) hours as needed for wheezing or shortness of breath. (Patient taking differently: Take 2.5 mg by nebulization 3 (three) times daily.) 90 mL 1   albuterol (VENTOLIN HFA) 108 (90 Base) MCG/ACT inhaler Inhale 2 puffs into the lungs every 6 (six) hours as needed for shortness of breath (COPD). 18 g 1   [START ON 09/11/2023] allopurinol (ZYLOPRIM) 100 MG tablet Take 1 tablet (100 mg total) by mouth daily.     apixaban (ELIQUIS) 5 MG TABS tablet Take 1 tablet (5 mg total) by mouth 2 (two) times daily. 60 tablet 2   budesonide-formoterol (SYMBICORT) 160-4.5 MCG/ACT inhaler Inhale 2 puffs into the lungs in the morning and at bedtime. 10.2 g 12   colchicine 0.6 MG tablet Take 0.5 tablets (0.3 mg total) by mouth daily. 12 tablet 0   diclofenac Sodium (VOLTAREN) 1 % GEL Apply 2 g topically 4 (four) times daily. (Patient taking differently: Apply 1 Application topically daily.)     docusate sodium (COLACE) 100 MG capsule Take 1 capsule (100 mg total) by mouth daily. 100 capsule 0   fluticasone (FLONASE) 50 MCG/ACT nasal spray Place 1 spray into both nostrils daily. (Patient taking differently: Place 2 sprays into both nostrils daily.) 16 g 2   insulin lispro (HUMALOG KWIKPEN) 100 UNIT/ML KwikPen 0-15 Units, Subcutaneous, 3 times daily with meal CBG < 70: Implement Hypoglycemia meausers CBG 70 - 120: 0 units CBG 121 - 150: 2  units CBG 151 - 200: 3 units CBG 201 - 250: 5 units CBG 251 - 300: 8 units CBG 301 - 350: 11 units CBG 351 - 400: 15 units CBG > 400: call MD (Patient taking differently: Inject 0-15 Units  into the skin daily. 0-15 Units,  CBG < 70: Implement Hypoglycemia meausers CBG 70 - 120: 0 units CBG 121 - 150: 2 units CBG 151 - 200: 3 units CBG 201 - 250: 5 units CBG 251 - 300: 8 units CBG 301 - 350: 11 units CBG 351 - 400: 15 units CBG > 400: call MD) 15 mL 11   levothyroxine (SYNTHROID) 175 MCG tablet Take 1 tablet (175 mcg total) by mouth daily. 30 tablet 3   lidocaine-prilocaine (EMLA) cream Apply 1 Application topically See admin instructions. On dialysis days (Tues,Thurs,Sat)     meclizine (ANTIVERT) 25 MG tablet Take 25 mg by mouth as needed for dizziness or nausea.     Methoxy PEG-Epoetin Beta (MIRCERA IJ) Inject 1 Syringe into the skin every 14 (fourteen) days.     metoprolol tartrate (LOPRESSOR) 25 MG tablet Take 1.5 tablets (37.5 mg total) by mouth 2 (two) times daily. 90 tablet 1   midodrine (PROAMATINE) 5 MG tablet Take 3 tablets (15 mg total) by mouth every 8 (eight) hours. 270 tablet 1   Multiple Vitamins-Iron (MULTIVITAMINS WITH IRON) TABS tablet Take 1 tablet by mouth in the morning. 30 tablet 1   nitroGLYCERIN (NITROSTAT) 0.4 MG SL tablet Place 1 tablet (0.4 mg total) under the tongue every 5 (five) minutes as needed for chest pain. 20 tablet 2   ondansetron (ZOFRAN-ODT) 4 MG disintegrating tablet Take 2 mg by mouth every 6 (six) hours as needed for nausea or vomiting.     OXYGEN Inhale into the lungs.     pantoprazole (PROTONIX) 40 MG tablet Take 1 tablet (40 mg total) by mouth 2 (two) times daily before a meal. (Patient taking differently: Take 40 mg by mouth daily.) 60 tablet 2   rosuvastatin (CRESTOR) 20 MG tablet Take 1 tablet (20 mg total) by mouth daily. 30 tablet 2   sevelamer carbonate (RENVELA) 800 MG tablet Take 2 tablets (1,600 mg total) by mouth 3 (three) times daily with meals.  180 tablet 2   thiamine (VITAMIN B1) 100 MG tablet Take 1 tablet (100 mg total) by mouth in the morning. 30 tablet 2   No current facility-administered medications for this visit.     Review of Systems    ***.  All other systems reviewed and are otherwise negative except as noted above.    Physical Exam    VS:  There were no vitals taken for this visit. , BMI There is no height or weight on file to calculate BMI.     GEN: Well nourished, well developed, in no acute distress. HEENT: normal. Neck: Supple, no JVD, carotid bruits, or masses. Cardiac: RRR, no murmurs, rubs, or gallops. No clubbing, cyanosis, edema.  Radials/DP/PT 2+ and equal bilaterally.  Respiratory:  Respirations regular and unlabored, clear to auscultation bilaterally. GI: Soft, nontender, nondistended, BS + x 4. MS: no deformity or atrophy. Skin: warm and dry, no rash. Neuro:  Strength and sensation are intact. Psych: Normal affect.  Accessory Clinical Findings    ECG personally reviewed by me today -    - no acute changes.   Lab Results  Component Value Date   WBC 9.4 08/30/2023   HGB 8.6 (L) 08/30/2023   HCT 29.4 (L) 08/30/2023   MCV 106.1 (H) 08/30/2023   PLT 250 08/30/2023   Lab Results  Component Value Date   CREATININE 6.87 (H) 08/28/2023   BUN 56 (H) 08/28/2023   NA 133 (L) 08/28/2023   K 4.2 08/28/2023  CL 94 (L) 08/28/2023   CO2 27 08/28/2023   Lab Results  Component Value Date   ALT 8 08/06/2023   AST 14 (L) 08/06/2023   ALKPHOS 63 08/06/2023   BILITOT 0.3 08/06/2023   Lab Results  Component Value Date   CHOL 131 07/06/2023   HDL 25 (L) 07/06/2023   LDLCALC 63 07/06/2023   TRIG 213 (H) 07/06/2023   CHOLHDL 5.2 07/06/2023    Lab Results  Component Value Date   HGBA1C 7.1 (H) 04/13/2023    Assessment & Plan    1.  ***  No BP recorded.  {Refresh Note OR Click here to enter BP  :1}***   Joylene Grapes, NP 09/02/2023, 5:50 AM

## 2023-09-09 ENCOUNTER — Other Ambulatory Visit: Payer: Self-pay

## 2023-09-09 ENCOUNTER — Encounter (HOSPITAL_COMMUNITY): Payer: Self-pay

## 2023-09-09 ENCOUNTER — Inpatient Hospital Stay (HOSPITAL_COMMUNITY)
Admission: EM | Admit: 2023-09-09 | Discharge: 2023-09-13 | DRG: 291 | Disposition: A | Discharge status: Home Health | Payer: 59 | Attending: Internal Medicine | Admitting: Internal Medicine

## 2023-09-09 ENCOUNTER — Emergency Department (HOSPITAL_COMMUNITY): Payer: 59

## 2023-09-09 DIAGNOSIS — I2510 Atherosclerotic heart disease of native coronary artery without angina pectoris: Secondary | ICD-10-CM | POA: Diagnosis present

## 2023-09-09 DIAGNOSIS — Z923 Personal history of irradiation: Secondary | ICD-10-CM

## 2023-09-09 DIAGNOSIS — I493 Ventricular premature depolarization: Secondary | ICD-10-CM | POA: Diagnosis present

## 2023-09-09 DIAGNOSIS — Z888 Allergy status to other drugs, medicaments and biological substances status: Secondary | ICD-10-CM

## 2023-09-09 DIAGNOSIS — I5023 Acute on chronic systolic (congestive) heart failure: Secondary | ICD-10-CM | POA: Diagnosis present

## 2023-09-09 DIAGNOSIS — J189 Pneumonia, unspecified organism: Secondary | ICD-10-CM | POA: Diagnosis present

## 2023-09-09 DIAGNOSIS — Z7902 Long term (current) use of antithrombotics/antiplatelets: Secondary | ICD-10-CM

## 2023-09-09 DIAGNOSIS — J441 Chronic obstructive pulmonary disease with (acute) exacerbation: Principal | ICD-10-CM | POA: Diagnosis present

## 2023-09-09 DIAGNOSIS — E785 Hyperlipidemia, unspecified: Secondary | ICD-10-CM | POA: Diagnosis present

## 2023-09-09 DIAGNOSIS — I953 Hypotension of hemodialysis: Secondary | ICD-10-CM | POA: Diagnosis not present

## 2023-09-09 DIAGNOSIS — Z833 Family history of diabetes mellitus: Secondary | ICD-10-CM

## 2023-09-09 DIAGNOSIS — Z6836 Body mass index (BMI) 36.0-36.9, adult: Secondary | ICD-10-CM

## 2023-09-09 DIAGNOSIS — I272 Pulmonary hypertension, unspecified: Secondary | ICD-10-CM | POA: Diagnosis present

## 2023-09-09 DIAGNOSIS — E039 Hypothyroidism, unspecified: Secondary | ICD-10-CM | POA: Diagnosis present

## 2023-09-09 DIAGNOSIS — Z66 Do not resuscitate: Secondary | ICD-10-CM | POA: Diagnosis present

## 2023-09-09 DIAGNOSIS — K219 Gastro-esophageal reflux disease without esophagitis: Secondary | ICD-10-CM | POA: Diagnosis present

## 2023-09-09 DIAGNOSIS — Z992 Dependence on renal dialysis: Secondary | ICD-10-CM

## 2023-09-09 DIAGNOSIS — G4733 Obstructive sleep apnea (adult) (pediatric): Secondary | ICD-10-CM | POA: Diagnosis present

## 2023-09-09 DIAGNOSIS — Z79890 Hormone replacement therapy (postmenopausal): Secondary | ICD-10-CM

## 2023-09-09 DIAGNOSIS — E875 Hyperkalemia: Secondary | ICD-10-CM | POA: Diagnosis present

## 2023-09-09 DIAGNOSIS — I132 Hypertensive heart and chronic kidney disease with heart failure and with stage 5 chronic kidney disease, or end stage renal disease: Principal | ICD-10-CM | POA: Diagnosis present

## 2023-09-09 DIAGNOSIS — J440 Chronic obstructive pulmonary disease with acute lower respiratory infection: Secondary | ICD-10-CM | POA: Diagnosis present

## 2023-09-09 DIAGNOSIS — I509 Heart failure, unspecified: Secondary | ICD-10-CM

## 2023-09-09 DIAGNOSIS — Z85118 Personal history of other malignant neoplasm of bronchus and lung: Secondary | ICD-10-CM

## 2023-09-09 DIAGNOSIS — E66812 Obesity, class 2: Secondary | ICD-10-CM

## 2023-09-09 DIAGNOSIS — Z794 Long term (current) use of insulin: Secondary | ICD-10-CM

## 2023-09-09 DIAGNOSIS — I25119 Atherosclerotic heart disease of native coronary artery with unspecified angina pectoris: Secondary | ICD-10-CM | POA: Diagnosis present

## 2023-09-09 DIAGNOSIS — D631 Anemia in chronic kidney disease: Secondary | ICD-10-CM | POA: Diagnosis present

## 2023-09-09 DIAGNOSIS — E1129 Type 2 diabetes mellitus with other diabetic kidney complication: Secondary | ICD-10-CM | POA: Diagnosis present

## 2023-09-09 DIAGNOSIS — Z885 Allergy status to narcotic agent status: Secondary | ICD-10-CM

## 2023-09-09 DIAGNOSIS — I255 Ischemic cardiomyopathy: Secondary | ICD-10-CM | POA: Diagnosis present

## 2023-09-09 DIAGNOSIS — I959 Hypotension, unspecified: Secondary | ICD-10-CM | POA: Diagnosis present

## 2023-09-09 DIAGNOSIS — Z9981 Dependence on supplemental oxygen: Secondary | ICD-10-CM

## 2023-09-09 DIAGNOSIS — E6609 Other obesity due to excess calories: Secondary | ICD-10-CM | POA: Diagnosis present

## 2023-09-09 DIAGNOSIS — Z8249 Family history of ischemic heart disease and other diseases of the circulatory system: Secondary | ICD-10-CM

## 2023-09-09 DIAGNOSIS — G894 Chronic pain syndrome: Secondary | ICD-10-CM | POA: Diagnosis present

## 2023-09-09 DIAGNOSIS — I428 Other cardiomyopathies: Secondary | ICD-10-CM | POA: Diagnosis present

## 2023-09-09 DIAGNOSIS — J9811 Atelectasis: Secondary | ICD-10-CM | POA: Diagnosis present

## 2023-09-09 DIAGNOSIS — N2581 Secondary hyperparathyroidism of renal origin: Secondary | ICD-10-CM | POA: Diagnosis present

## 2023-09-09 DIAGNOSIS — Z993 Dependence on wheelchair: Secondary | ICD-10-CM

## 2023-09-09 DIAGNOSIS — Z8673 Personal history of transient ischemic attack (TIA), and cerebral infarction without residual deficits: Secondary | ICD-10-CM

## 2023-09-09 DIAGNOSIS — Z79899 Other long term (current) drug therapy: Secondary | ICD-10-CM

## 2023-09-09 DIAGNOSIS — I5043 Acute on chronic combined systolic (congestive) and diastolic (congestive) heart failure: Secondary | ICD-10-CM | POA: Diagnosis present

## 2023-09-09 DIAGNOSIS — Z7901 Long term (current) use of anticoagulants: Secondary | ICD-10-CM

## 2023-09-09 DIAGNOSIS — E1122 Type 2 diabetes mellitus with diabetic chronic kidney disease: Secondary | ICD-10-CM | POA: Diagnosis present

## 2023-09-09 DIAGNOSIS — N186 End stage renal disease: Secondary | ICD-10-CM

## 2023-09-09 DIAGNOSIS — Z7951 Long term (current) use of inhaled steroids: Secondary | ICD-10-CM

## 2023-09-09 DIAGNOSIS — Z6837 Body mass index (BMI) 37.0-37.9, adult: Secondary | ICD-10-CM

## 2023-09-09 DIAGNOSIS — E1151 Type 2 diabetes mellitus with diabetic peripheral angiopathy without gangrene: Secondary | ICD-10-CM | POA: Diagnosis present

## 2023-09-09 DIAGNOSIS — J9621 Acute and chronic respiratory failure with hypoxia: Secondary | ICD-10-CM | POA: Diagnosis present

## 2023-09-09 DIAGNOSIS — Z87891 Personal history of nicotine dependence: Secondary | ICD-10-CM

## 2023-09-09 DIAGNOSIS — M109 Gout, unspecified: Secondary | ICD-10-CM | POA: Diagnosis present

## 2023-09-09 DIAGNOSIS — I480 Paroxysmal atrial fibrillation: Secondary | ICD-10-CM

## 2023-09-09 LAB — I-STAT VENOUS BLOOD GAS, ED
Acid-Base Excess: 4 mmol/L — ABNORMAL HIGH (ref 0.0–2.0)
Bicarbonate: 31.5 mmol/L — ABNORMAL HIGH (ref 20.0–28.0)
Calcium, Ion: 1.17 mmol/L (ref 1.15–1.40)
HCT: 32 % — ABNORMAL LOW (ref 36.0–46.0)
Hemoglobin: 10.9 g/dL — ABNORMAL LOW (ref 12.0–15.0)
O2 Saturation: 99 %
Potassium: 5.6 mmol/L — ABNORMAL HIGH (ref 3.5–5.1)
Sodium: 138 mmol/L (ref 135–145)
TCO2: 33 mmol/L — ABNORMAL HIGH (ref 22–32)
pCO2, Ven: 58.4 mm[Hg] (ref 44–60)
pH, Ven: 7.34 (ref 7.25–7.43)
pO2, Ven: 137 mm[Hg] — ABNORMAL HIGH (ref 32–45)

## 2023-09-09 LAB — CBC WITH DIFFERENTIAL/PLATELET
Abs Immature Granulocytes: 0.16 10*3/uL — ABNORMAL HIGH (ref 0.00–0.07)
Basophils Absolute: 0 10*3/uL (ref 0.0–0.1)
Basophils Relative: 0 %
Eosinophils Absolute: 0.1 10*3/uL (ref 0.0–0.5)
Eosinophils Relative: 1 %
HCT: 31.1 % — ABNORMAL LOW (ref 36.0–46.0)
Hemoglobin: 9.2 g/dL — ABNORMAL LOW (ref 12.0–15.0)
Immature Granulocytes: 1 %
Lymphocytes Relative: 8 %
Lymphs Abs: 1.3 10*3/uL (ref 0.7–4.0)
MCH: 31.2 pg (ref 26.0–34.0)
MCHC: 29.6 g/dL — ABNORMAL LOW (ref 30.0–36.0)
MCV: 105.4 fL — ABNORMAL HIGH (ref 80.0–100.0)
Monocytes Absolute: 0.8 10*3/uL (ref 0.1–1.0)
Monocytes Relative: 5 %
Neutro Abs: 14.3 10*3/uL — ABNORMAL HIGH (ref 1.7–7.7)
Neutrophils Relative %: 85 %
Platelets: 239 10*3/uL (ref 150–400)
RBC: 2.95 MIL/uL — ABNORMAL LOW (ref 3.87–5.11)
RDW: 15.8 % — ABNORMAL HIGH (ref 11.5–15.5)
WBC: 16.7 10*3/uL — ABNORMAL HIGH (ref 4.0–10.5)
nRBC: 0.2 % (ref 0.0–0.2)

## 2023-09-09 LAB — COMPREHENSIVE METABOLIC PANEL
ALT: 17 U/L (ref 0–44)
AST: 27 U/L (ref 15–41)
Albumin: 3.3 g/dL — ABNORMAL LOW (ref 3.5–5.0)
Alkaline Phosphatase: 102 U/L (ref 38–126)
Anion gap: 19 — ABNORMAL HIGH (ref 5–15)
BUN: 105 mg/dL — ABNORMAL HIGH (ref 8–23)
CO2: 25 mmol/L (ref 22–32)
Calcium: 10.1 mg/dL (ref 8.9–10.3)
Chloride: 98 mmol/L (ref 98–111)
Creatinine, Ser: 8.09 mg/dL — ABNORMAL HIGH (ref 0.44–1.00)
GFR, Estimated: 5 mL/min — ABNORMAL LOW (ref >60)
Glucose, Bld: 198 mg/dL — ABNORMAL HIGH (ref 70–99)
Potassium: 5.7 mmol/L — ABNORMAL HIGH (ref 3.5–5.1)
Sodium: 142 mmol/L (ref 135–145)
Total Bilirubin: 0.6 mg/dL (ref <1.2)
Total Protein: 6.7 g/dL (ref 6.5–8.1)

## 2023-09-09 LAB — BRAIN NATRIURETIC PEPTIDE: B Natriuretic Peptide: 1611.2 pg/mL — ABNORMAL HIGH (ref 0.0–100.0)

## 2023-09-09 LAB — TROPONIN I (HIGH SENSITIVITY): Troponin I (High Sensitivity): 153 ng/L (ref <18)

## 2023-09-09 MED ORDER — IPRATROPIUM-ALBUTEROL 0.5-2.5 (3) MG/3ML IN SOLN
3.0000 mL | Freq: Once | RESPIRATORY_TRACT | Status: AC
  Administered 2023-09-09: 3 mL via RESPIRATORY_TRACT
  Filled 2023-09-09: qty 3

## 2023-09-09 NOTE — ED Triage Notes (Addendum)
Pt BIB [facility] from home.SHOB all day but has gotten worse throughout the day. Upon EMS arrival pt was at 85% on RA. Pt was on CPAP upon arrival. 3L [place] @baseline . Dialysis. Tues, Thurs, Sat. Wheezing after first neb treatment on CPAP. Left arm restricted. Hx of DM and COPD  EMS 167/77 BP 94 P R 20 CBG 231 2 duo nebs 10 albuterol 1 mg of Atrovent.

## 2023-09-09 NOTE — ED Provider Notes (Signed)
[place] EMERGENCY DEPARTMENT AT [facility] Provider Note   CSN: [id number] Arrival date & time: 09/09/23  2004     History {Add pertinent medical, surgical, social history, OB history to HPI:1} Chief Complaint  Patient presents with   Shortness of Breath    [name] [name] is a 72 y.o. female.  HPI 72 y.o. 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  She reports that she had some tightness of her chest.  She reports she tried taking a lot of her nebulizer therapies but did not get really much improvement.  She denies fever.  Denies productive cough.  Patient reports she has been compliant with her medications.    Home Medications Prior to Admission medications   Medication Sig Start Date End Date Taking? Authorizing Provider  acetaminophen (TYLENOL) 500 MG tablet Take 1 tablet (500 mg total) by mouth See admin instructions. Take 1,000mg  (2 tablets) by mouth twice a day in addition to 1,000mg  (2 tablets) twice daily as needed for pain. Patient taking differently: Take 1,000 mg by mouth in the morning and at bedtime. 05/14/23   Ghimire, Werner Lean, MD  albuterol (PROVENTIL) (2.5 MG/3ML) 0.083% nebulizer solution Take 3 mLs (2.5 mg total) by nebulization every 6 (six) hours as needed for wheezing or shortness of breath. Patient taking differently: Take 2.5 mg by nebulization 3 (three) times daily. 05/14/23   Ghimire, Werner Lean, MD  albuterol (VENTOLIN HFA) 108 (90 Base) MCG/ACT inhaler Inhale 2 puffs into the lungs every 6 (six) hours as needed for shortness of breath (COPD). 05/14/23   Ghimire, Werner Lean, MD  allopurinol (ZYLOPRIM) 100 MG tablet Take 1 tablet (100 mg total) by mouth daily. 09/11/23   Leroy Sea, MD  apixaban  (ELIQUIS) 5 MG TABS tablet Take 1 tablet (5 mg total) by mouth 2 (two) times daily. 07/20/23   Ghimire, Werner Lean, MD  budesonide-formoterol (SYMBICORT) 160-4.5 MCG/ACT inhaler Inhale 2 puffs into the lungs in the morning and at bedtime. 05/14/23   Ghimire, Werner Lean, MD  colchicine 0.6 MG tablet Take 0.5 tablets (0.3 mg total) by mouth daily. 08/29/23   Leroy Sea, MD  diclofenac Sodium (VOLTAREN) 1 % GEL Apply 2 g topically 4 (four) times daily. Patient taking differently: Apply 1 Application topically daily. 04/17/23   Joseph Art, DO  docusate sodium (COLACE) 100 MG capsule Take 1 capsule (100 mg total) by mouth daily. 05/14/23   Ghimire, Werner Lean, MD  fluticasone (FLONASE) 50 MCG/ACT nasal spray Place 1 spray into both nostrils daily. Patient taking differently: Place 2 sprays into both nostrils daily. 05/14/23   Ghimire, Werner Lean, MD  insulin lispro (HUMALOG KWIKPEN) 100 UNIT/ML KwikPen 0-15 Units, Subcutaneous, 3 times daily with meal CBG < 70: Implement Hypoglycemia meausers CBG 70 - 120: 0 units CBG 121 - 150: 2 units CBG 151 - 200: 3 units CBG 201 - 250: 5 units CBG 251 - 300: 8 units CBG 301 - 350: 11 units CBG 351 - 400: 15 units CBG > 400: call MD Patient taking differently: Inject 0-15 Units into the skin daily. 0-15 Units,  CBG < 70: Implement Hypoglycemia meausers CBG 70 - 120: 0 units CBG 121 - 150: 2 units CBG 151 -  200: 3 units CBG 201 - 250: 5 units CBG 251 - 300: 8 units CBG 301 - 350: 11 units CBG 351 - 400: 15 units CBG > 400: call MD 05/14/23   Maretta Bees, MD  levothyroxine (SYNTHROID) 175 MCG tablet Take 1 tablet (175 mcg total) by mouth daily. 05/14/23   Ghimire, Werner Lean, MD  lidocaine-prilocaine (EMLA) cream Apply 1 Application topically See admin instructions. On dialysis days (Tues,Thurs,Sat) 02/13/22   [provider]  meclizine (ANTIVERT) 25 MG tablet Take 25 mg by mouth as needed for dizziness or nausea. 06/12/23   [provider]  Methoxy  PEG-Epoetin Beta (MIRCERA IJ) Inject 1 Syringe into the skin every 14 (fourteen) days. 06/01/23 05/30/24  [provider]  metoprolol tartrate (LOPRESSOR) 25 MG tablet Take 1.5 tablets (37.5 mg total) by mouth 2 (two) times daily. 07/20/23   Ghimire, Werner Lean, MD  midodrine (PROAMATINE) 5 MG tablet Take 3 tablets (15 mg total) by mouth every 8 (eight) hours. 07/20/23   Ghimire, Werner Lean, MD  Multiple Vitamins-Iron (MULTIVITAMINS WITH IRON) TABS tablet Take 1 tablet by mouth in the morning. 05/14/23   Ghimire, Werner Lean, MD  nitroGLYCERIN (NITROSTAT) 0.4 MG SL tablet Place 1 tablet (0.4 mg total) under the tongue every 5 (five) minutes as needed for chest pain. 07/06/23 07/05/24  Cyndi Bender, NP  ondansetron (ZOFRAN-ODT) 4 MG disintegrating tablet Take 2 mg by mouth every 6 (six) hours as needed for nausea or vomiting. 07/10/23   [provider]  OXYGEN Inhale into the lungs.    [provider]  pantoprazole (PROTONIX) 40 MG tablet Take 1 tablet (40 mg total) by mouth 2 (two) times daily before a meal. Patient taking differently: Take 40 mg by mouth daily. 05/14/23   Ghimire, Werner Lean, MD  rosuvastatin (CRESTOR) 20 MG tablet Take 1 tablet (20 mg total) by mouth daily. 07/06/23   Cyndi Bender, NP  sevelamer carbonate (RENVELA) 800 MG tablet Take 2 tablets (1,600 mg total) by mouth 3 (three) times daily with meals. 05/14/23   Ghimire, Werner Lean, MD  thiamine (VITAMIN B1) 100 MG tablet Take 1 tablet (100 mg total) by mouth in the morning. 05/14/23   Ghimire, Werner Lean, MD      Allergies    Nebivolol, Zestril [lisinopril], Ace inhibitors, and Morphine and codeine    Review of Systems   Review of Systems  Physical Exam Updated Vital Signs BP (!) 151/65   Pulse 94   Temp 98.5 F (36.9 C) (Temporal)   Resp 17   SpO2 100%  Physical Exam  ED Results / Procedures / Treatments   Labs (all labs ordered are listed, but only abnormal results are displayed) Labs Reviewed - No data to  display  EKG EKG Interpretation Date/Time:  Monday September 09 2023 20:15:15 EST Ventricular Rate:  92 PR Interval:  192 QRS Duration:  147 QT Interval:  393 QTC Calculation: 487 R Axis:   -36  Text Interpretation: Sinus rhythm Multiple ventricular premature complexes Left bundle branch block no sig change from previous Confirmed by Arby Barrette [phone]) on 09/09/2023 8:33:12 PM  Radiology No results found.  Procedures Procedures  {Document cardiac monitor, telemetry assessment procedure when appropriate:1}  Medications Ordered in ED Medications - No data to display  ED Course/ Medical Decision Making/ A&P   {   Click here for ABCD2, HEART and other calculatorsREFRESH Note before signing :1}  Medical Decision Making Amount and/or Complexity of Data Reviewed Labs: ordered. Radiology: ordered.  Risk Prescription drug management.   ***  {Document critical care time when appropriate:1} {Document review of labs and clinical decision tools ie heart score, Chads2Vasc2 etc:1}  {Document your independent review of radiology images, and any outside records:1} {Document your discussion with family members, caretakers, and with consultants:1} {Document social determinants of health affecting pt's care:1} {Document your decision making why or why not admission, treatments were needed:1} Final Clinical Impression(s) / ED Diagnoses Final diagnoses:  None    Rx / DC Orders ED Discharge Orders     None

## 2023-09-09 NOTE — ED Provider Notes (Incomplete)
[place] EMERGENCY DEPARTMENT AT [facility] Provider Note   CSN: [id number] Arrival date & time: 09/09/23  2004     History {Add pertinent medical, surgical, social history, OB history to HPI:1} Chief Complaint  Patient presents with  . Shortness of Breath    Lashonia Henjum Hewgley is a 72 y.o. female.  HPI 72 y.o. female with history of ESRD on HD TTS, HFrEF, COPD on home O2 3 L/min, nonobstructive CAD on recent [facility] 9/27, PAF on Eliquis, small cell cancer of lung-s/p radiation (completed April 06, 2022), OSA-nontolerant to CPAP, HTN, DM-2   Reports she started becoming increasingly short of breath today.  She reports that she had some tightness of her chest.  She reports she tried taking a lot of her nebulizer therapies but did not get really much improvement.  She denies fever.  Denies productive cough.  Patient reports she has been compliant with her medications.  Denies increased lower extremity swelling.  Patient reports she went to dialysis on Saturday which is her scheduled day.  She is due for dialysis tomorrow.    Home Medications Prior to Admission medications   Medication Sig Start Date End Date Taking? Authorizing Provider  acetaminophen (TYLENOL) 500 MG tablet Take 1 tablet (500 mg total) by mouth See admin instructions. Take 1,000mg  (2 tablets) by mouth twice a day in addition to 1,000mg  (2 tablets) twice daily as needed for pain. Patient taking differently: Take 1,000 mg by mouth in the morning and at bedtime. 05/14/23   Ghimire, Werner Lean, MD  albuterol (PROVENTIL) (2.5 MG/3ML) 0.083% nebulizer solution Take 3 mLs (2.5 mg total) by nebulization every 6 (six) hours as needed for wheezing or shortness of breath. Patient taking differently: Take 2.5 mg by nebulization 3 (three) times daily. 05/14/23   Ghimire, Werner Lean, MD  albuterol (VENTOLIN HFA) 108 (90 Base) MCG/ACT inhaler Inhale 2 puffs into the lungs every 6 (six) hours as needed for shortness of breath (COPD).  05/14/23   Ghimire, Werner Lean, MD  allopurinol (ZYLOPRIM) 100 MG tablet Take 1 tablet (100 mg total) by mouth daily. 09/11/23   Leroy Sea, MD  apixaban (ELIQUIS) 5 MG TABS tablet Take 1 tablet (5 mg total) by mouth 2 (two) times daily. 07/20/23   Ghimire, Werner Lean, MD  budesonide-formoterol (SYMBICORT) 160-4.5 MCG/ACT inhaler Inhale 2 puffs into the lungs in the morning and at bedtime. 05/14/23   Ghimire, Werner Lean, MD  colchicine 0.6 MG tablet Take 0.5 tablets (0.3 mg total) by mouth daily. 08/29/23   Leroy Sea, MD  diclofenac Sodium (VOLTAREN) 1 % GEL Apply 2 g topically 4 (four) times daily. Patient taking differently: Apply 1 Application topically daily. 04/17/23   Joseph Art, DO  docusate sodium (COLACE) 100 MG capsule Take 1 capsule (100 mg total) by mouth daily. 05/14/23   Ghimire, Werner Lean, MD  fluticasone (FLONASE) 50 MCG/ACT nasal spray Place 1 spray into both nostrils daily. Patient taking differently: Place 2 sprays into both nostrils daily. 05/14/23   Ghimire, Werner Lean, MD  insulin lispro (HUMALOG KWIKPEN) 100 UNIT/ML KwikPen 0-15 Units, Subcutaneous, 3 times daily with meal CBG < 70: Implement Hypoglycemia meausers CBG 70 - 120: 0 units CBG 121 - 150: 2 units CBG 151 - 200: 3 units CBG 201 - 250: 5 units CBG 251 - 300: 8 units CBG 301 - 350: 11 units CBG 351 - 400: 15 units CBG > 400: call MD Patient taking differently: Inject 0-15 Units  into the skin daily. 0-15 Units,  CBG < 70: Implement Hypoglycemia meausers CBG 70 - 120: 0 units CBG 121 - 150: 2 units CBG 151 - 200: 3 units CBG 201 - 250: 5 units CBG 251 - 300: 8 units CBG 301 - 350: 11 units CBG 351 - 400: 15 units CBG > 400: call MD 05/14/23   Maretta Bees, MD  levothyroxine (SYNTHROID) 175 MCG tablet Take 1 tablet (175 mcg total) by mouth daily. 05/14/23   Ghimire, Werner Lean, MD  lidocaine-prilocaine (EMLA) cream Apply 1 Application topically See admin instructions. On dialysis days (Tues,Thurs,Sat) 02/13/22    [provider]  meclizine (ANTIVERT) 25 MG tablet Take 25 mg by mouth as needed for dizziness or nausea. 06/12/23   [provider]  Methoxy PEG-Epoetin Beta (MIRCERA IJ) Inject 1 Syringe into the skin every 14 (fourteen) days. 06/01/23 05/30/24  [provider]  metoprolol tartrate (LOPRESSOR) 25 MG tablet Take 1.5 tablets (37.5 mg total) by mouth 2 (two) times daily. 07/20/23   Ghimire, Werner Lean, MD  midodrine (PROAMATINE) 5 MG tablet Take 3 tablets (15 mg total) by mouth every 8 (eight) hours. 07/20/23   Ghimire, Werner Lean, MD  Multiple Vitamins-Iron (MULTIVITAMINS WITH IRON) TABS tablet Take 1 tablet by mouth in the morning. 05/14/23   Ghimire, Werner Lean, MD  nitroGLYCERIN (NITROSTAT) 0.4 MG SL tablet Place 1 tablet (0.4 mg total) under the tongue every 5 (five) minutes as needed for chest pain. 07/06/23 07/05/24  Cyndi Bender, NP  ondansetron (ZOFRAN-ODT) 4 MG disintegrating tablet Take 2 mg by mouth every 6 (six) hours as needed for nausea or vomiting. 07/10/23   [provider]  OXYGEN Inhale into the lungs.    [provider]  pantoprazole (PROTONIX) 40 MG tablet Take 1 tablet (40 mg total) by mouth 2 (two) times daily before a meal. Patient taking differently: Take 40 mg by mouth daily. 05/14/23   Ghimire, Werner Lean, MD  rosuvastatin (CRESTOR) 20 MG tablet Take 1 tablet (20 mg total) by mouth daily. 07/06/23   Cyndi Bender, NP  sevelamer carbonate (RENVELA) 800 MG tablet Take 2 tablets (1,600 mg total) by mouth 3 (three) times daily with meals. 05/14/23   Ghimire, Werner Lean, MD  thiamine (VITAMIN B1) 100 MG tablet Take 1 tablet (100 mg total) by mouth in the morning. 05/14/23   Ghimire, Werner Lean, MD      Allergies    Nebivolol, Zestril [lisinopril], Ace inhibitors, and Morphine and codeine    Review of Systems   Review of Systems  Physical Exam Updated Vital Signs BP (!) 151/65   Pulse 94   Temp 98.5 F (36.9 C) (Temporal)   Resp 17   SpO2 100%   Physical Exam Constitutional:      Comments: Patient is alert.  She is on BiPAP.  She is able to answer questions.  Central obesity.     ED Results / Procedures / Treatments   Labs (all labs ordered are listed, but only abnormal results are displayed) Labs Reviewed - No data to display  EKG EKG Interpretation Date/Time:  Monday September 09 2023 20:15:15 EST Ventricular Rate:  92 PR Interval:  192 QRS Duration:  147 QT Interval:  393 QTC Calculation: 487 R Axis:   -36  Text Interpretation: Sinus rhythm Multiple ventricular premature complexes Left bundle branch block no sig change from previous Confirmed by Arby Barrette [phone]) on 09/09/2023 8:33:12 PM  Radiology No results found.  Procedures  Procedures  {Document cardiac monitor, telemetry assessment procedure when appropriate:1}  Medications Ordered in ED Medications - No data to display  ED Course/ Medical Decision Making/ A&P   {   Click here for ABCD2, HEART and other calculatorsREFRESH Note before signing :1}                              Medical Decision Making Amount and/or Complexity of Data Reviewed Labs: ordered. Radiology: ordered.  Risk Prescription drug management.   ***  {Document critical care time when appropriate:1} {Document review of labs and clinical decision tools ie heart score, Chads2Vasc2 etc:1}  {Document your independent review of radiology images, and any outside records:1} {Document your discussion with family members, caretakers, and with consultants:1} {Document social determinants of health affecting pt's care:1} {Document your decision making why or why not admission, treatments were needed:1} Final Clinical Impression(s) / ED Diagnoses Final diagnoses:  None    Rx / DC Orders ED Discharge Orders     None

## 2023-09-10 ENCOUNTER — Inpatient Hospital Stay (HOSPITAL_COMMUNITY): Payer: 59

## 2023-09-10 DIAGNOSIS — I428 Other cardiomyopathies: Secondary | ICD-10-CM | POA: Diagnosis present

## 2023-09-10 DIAGNOSIS — I480 Paroxysmal atrial fibrillation: Secondary | ICD-10-CM

## 2023-09-10 DIAGNOSIS — N186 End stage renal disease: Secondary | ICD-10-CM | POA: Diagnosis present

## 2023-09-10 DIAGNOSIS — R0600 Dyspnea, unspecified: Secondary | ICD-10-CM | POA: Diagnosis not present

## 2023-09-10 DIAGNOSIS — E039 Hypothyroidism, unspecified: Secondary | ICD-10-CM | POA: Diagnosis present

## 2023-09-10 DIAGNOSIS — Z992 Dependence on renal dialysis: Secondary | ICD-10-CM

## 2023-09-10 DIAGNOSIS — E1122 Type 2 diabetes mellitus with diabetic chronic kidney disease: Secondary | ICD-10-CM

## 2023-09-10 DIAGNOSIS — I5043 Acute on chronic combined systolic (congestive) and diastolic (congestive) heart failure: Secondary | ICD-10-CM

## 2023-09-10 DIAGNOSIS — E875 Hyperkalemia: Secondary | ICD-10-CM | POA: Diagnosis present

## 2023-09-10 DIAGNOSIS — I953 Hypotension of hemodialysis: Secondary | ICD-10-CM | POA: Diagnosis not present

## 2023-09-10 DIAGNOSIS — J440 Chronic obstructive pulmonary disease with acute lower respiratory infection: Secondary | ICD-10-CM | POA: Diagnosis present

## 2023-09-10 DIAGNOSIS — I5021 Acute systolic (congestive) heart failure: Secondary | ICD-10-CM | POA: Diagnosis not present

## 2023-09-10 DIAGNOSIS — Z794 Long term (current) use of insulin: Secondary | ICD-10-CM | POA: Diagnosis not present

## 2023-09-10 DIAGNOSIS — J189 Pneumonia, unspecified organism: Secondary | ICD-10-CM | POA: Diagnosis present

## 2023-09-10 DIAGNOSIS — E1151 Type 2 diabetes mellitus with diabetic peripheral angiopathy without gangrene: Secondary | ICD-10-CM | POA: Diagnosis present

## 2023-09-10 DIAGNOSIS — N2581 Secondary hyperparathyroidism of renal origin: Secondary | ICD-10-CM | POA: Diagnosis present

## 2023-09-10 DIAGNOSIS — J9621 Acute and chronic respiratory failure with hypoxia: Secondary | ICD-10-CM | POA: Diagnosis present

## 2023-09-10 DIAGNOSIS — I132 Hypertensive heart and chronic kidney disease with heart failure and with stage 5 chronic kidney disease, or end stage renal disease: Secondary | ICD-10-CM | POA: Diagnosis present

## 2023-09-10 DIAGNOSIS — J90 Pleural effusion, not elsewhere classified: Secondary | ICD-10-CM | POA: Diagnosis not present

## 2023-09-10 DIAGNOSIS — I5023 Acute on chronic systolic (congestive) heart failure: Secondary | ICD-10-CM | POA: Diagnosis present

## 2023-09-10 DIAGNOSIS — I959 Hypotension, unspecified: Secondary | ICD-10-CM

## 2023-09-10 DIAGNOSIS — J441 Chronic obstructive pulmonary disease with (acute) exacerbation: Secondary | ICD-10-CM | POA: Diagnosis present

## 2023-09-10 DIAGNOSIS — J9811 Atelectasis: Secondary | ICD-10-CM | POA: Diagnosis present

## 2023-09-10 DIAGNOSIS — E6609 Other obesity due to excess calories: Secondary | ICD-10-CM | POA: Diagnosis present

## 2023-09-10 DIAGNOSIS — E66812 Obesity, class 2: Secondary | ICD-10-CM

## 2023-09-10 DIAGNOSIS — D631 Anemia in chronic kidney disease: Secondary | ICD-10-CM | POA: Diagnosis present

## 2023-09-10 DIAGNOSIS — E1129 Type 2 diabetes mellitus with other diabetic kidney complication: Secondary | ICD-10-CM

## 2023-09-10 DIAGNOSIS — J9620 Acute and chronic respiratory failure, unspecified whether with hypoxia or hypercapnia: Secondary | ICD-10-CM | POA: Diagnosis not present

## 2023-09-10 DIAGNOSIS — Z66 Do not resuscitate: Secondary | ICD-10-CM | POA: Diagnosis present

## 2023-09-10 DIAGNOSIS — Z9981 Dependence on supplemental oxygen: Secondary | ICD-10-CM | POA: Diagnosis not present

## 2023-09-10 DIAGNOSIS — Z6836 Body mass index (BMI) 36.0-36.9, adult: Secondary | ICD-10-CM

## 2023-09-10 DIAGNOSIS — I272 Pulmonary hypertension, unspecified: Secondary | ICD-10-CM | POA: Diagnosis present

## 2023-09-10 DIAGNOSIS — E785 Hyperlipidemia, unspecified: Secondary | ICD-10-CM | POA: Diagnosis present

## 2023-09-10 LAB — I-STAT VENOUS BLOOD GAS, ED
Acid-Base Excess: 1 mmol/L (ref 0.0–2.0)
Bicarbonate: 28.5 mmol/L — ABNORMAL HIGH (ref 20.0–28.0)
Calcium, Ion: 1.14 mmol/L — ABNORMAL LOW (ref 1.15–1.40)
HCT: 31 % — ABNORMAL LOW (ref 36.0–46.0)
Hemoglobin: 10.5 g/dL — ABNORMAL LOW (ref 12.0–15.0)
O2 Saturation: 58 %
Potassium: 5.8 mmol/L — ABNORMAL HIGH (ref 3.5–5.1)
Sodium: 137 mmol/L (ref 135–145)
TCO2: 30 mmol/L (ref 22–32)
pCO2, Ven: 57.6 mm[Hg] (ref 44–60)
pH, Ven: 7.303 (ref 7.25–7.43)
pO2, Ven: 34 mm[Hg] (ref 32–45)

## 2023-09-10 LAB — ECHOCARDIOGRAM LIMITED
Calc EF: 9.5 %
Height: 63 in
S' Lateral: 5.2 cm
Single Plane A2C EF: 7 %
Single Plane A4C EF: 17.8 %
Weight: 3400.38 [oz_av]

## 2023-09-10 LAB — RENAL FUNCTION PANEL
Albumin: 3.3 g/dL — ABNORMAL LOW (ref 3.5–5.0)
Anion gap: 20 — ABNORMAL HIGH (ref 5–15)
BUN: 106 mg/dL — ABNORMAL HIGH (ref 8–23)
CO2: 22 mmol/L (ref 22–32)
Calcium: 10 mg/dL (ref 8.9–10.3)
Chloride: 98 mmol/L (ref 98–111)
Creatinine, Ser: 8.31 mg/dL — ABNORMAL HIGH (ref 0.44–1.00)
GFR, Estimated: 5 mL/min — ABNORMAL LOW (ref >60)
Glucose, Bld: 205 mg/dL — ABNORMAL HIGH (ref 70–99)
Phosphorus: 5.7 mg/dL — ABNORMAL HIGH (ref 2.5–4.6)
Potassium: 6 mmol/L — ABNORMAL HIGH (ref 3.5–5.1)
Sodium: 140 mmol/L (ref 135–145)

## 2023-09-10 LAB — CBC
HCT: 32.2 % — ABNORMAL LOW (ref 36.0–46.0)
Hemoglobin: 9.1 g/dL — ABNORMAL LOW (ref 12.0–15.0)
MCH: 30.4 pg (ref 26.0–34.0)
MCHC: 28.3 g/dL — ABNORMAL LOW (ref 30.0–36.0)
MCV: 107.7 fL — ABNORMAL HIGH (ref 80.0–100.0)
Platelets: 226 10*3/uL (ref 150–400)
RBC: 2.99 MIL/uL — ABNORMAL LOW (ref 3.87–5.11)
RDW: 16 % — ABNORMAL HIGH (ref 11.5–15.5)
WBC: 15 10*3/uL — ABNORMAL HIGH (ref 4.0–10.5)
nRBC: 0.4 % — ABNORMAL HIGH (ref 0.0–0.2)

## 2023-09-10 LAB — GLUCOSE, CAPILLARY: Glucose-Capillary: 129 mg/dL — ABNORMAL HIGH (ref 70–99)

## 2023-09-10 LAB — HEPATITIS B SURFACE ANTIGEN: Hepatitis B Surface Ag: NONREACTIVE

## 2023-09-10 LAB — TROPONIN I (HIGH SENSITIVITY)
Troponin I (High Sensitivity): 161 ng/L (ref <18)
Troponin I (High Sensitivity): 178 ng/L (ref <18)
Troponin I (High Sensitivity): 172 ng/L (ref <18)

## 2023-09-10 LAB — TSH: TSH: 13.623 u[IU]/mL — ABNORMAL HIGH (ref 0.350–4.500)

## 2023-09-10 LAB — CORTISOL: Cortisol, Plasma: 11.8 ug/dL

## 2023-09-10 LAB — PROCALCITONIN: Procalcitonin: 1.27 ng/mL

## 2023-09-10 MED ORDER — PENTAFLUOROPROP-TETRAFLUOROETH EX AERO: 1.0000 | INHALATION_SPRAY | CUTANEOUS | Status: DC | PRN

## 2023-09-10 MED ORDER — HEPARIN SODIUM (PORCINE) 1000 UNIT/ML DIALYSIS
2000.0000 [IU] | INTRAMUSCULAR | Status: DC | PRN
  Administered 2023-09-10: 2000 [IU] via INTRAVENOUS_CENTRAL

## 2023-09-10 MED ORDER — MIDODRINE HCL 5 MG PO TABS
15.0000 mg | ORAL_TABLET | Freq: Three times a day (TID) | ORAL | Status: DC
  Administered 2023-09-10 – 2023-09-13 (×5): 15 mg via ORAL
  Filled 2023-09-10 (×8): qty 3

## 2023-09-10 MED ORDER — LIDOCAINE-PRILOCAINE 2.5-2.5 % EX CREA: 1.0000 | TOPICAL_CREAM | CUTANEOUS | Status: DC | PRN

## 2023-09-10 MED ORDER — COLLAGENASE 250 UNIT/GM EX OINT
TOPICAL_OINTMENT | Freq: Every day | CUTANEOUS | Status: DC
  Filled 2023-09-10: qty 30

## 2023-09-10 MED ORDER — ARFORMOTEROL TARTRATE 15 MCG/2ML IN NEBU
15.0000 ug | INHALATION_SOLUTION | Freq: Two times a day (BID) | RESPIRATORY_TRACT | Status: DC
  Administered 2023-09-10 – 2023-09-13 (×5): 15 ug via RESPIRATORY_TRACT
  Filled 2023-09-10 (×6): qty 2

## 2023-09-10 MED ORDER — ALBUMIN HUMAN 25 % IV SOLN
25.0000 g | Freq: Once | INTRAVENOUS | Status: AC
  Administered 2023-09-10: 25 g via INTRAVENOUS
  Filled 2023-09-10: qty 100

## 2023-09-10 MED ORDER — PERFLUTREN LIPID MICROSPHERE
1.0000 mL | INTRAVENOUS | Status: AC | PRN
  Administered 2023-09-10: 3 mL via INTRAVENOUS

## 2023-09-10 MED ORDER — BUDESONIDE 0.5 MG/2ML IN SUSP
0.5000 mg | Freq: Two times a day (BID) | RESPIRATORY_TRACT | Status: DC
  Administered 2023-09-10 – 2023-09-13 (×5): 0.5 mg via RESPIRATORY_TRACT
  Filled 2023-09-10 (×6): qty 2

## 2023-09-10 MED ORDER — ACETAMINOPHEN 325 MG PO TABS
650.0000 mg | ORAL_TABLET | Freq: Four times a day (QID) | ORAL | Status: DC | PRN
  Administered 2023-09-12 (×2): 650 mg via ORAL
  Filled 2023-09-10 (×2): qty 2

## 2023-09-10 MED ORDER — HEPARIN SODIUM (PORCINE) 1000 UNIT/ML IJ SOLN
2000.0000 [IU] | Freq: Once | INTRAMUSCULAR | Status: AC
  Administered 2023-09-12: 2000 [IU]
  Filled 2023-09-10: qty 2

## 2023-09-10 MED ORDER — MOMETASONE FURO-FORMOTEROL FUM 200-5 MCG/ACT IN AERO
2.0000 | INHALATION_SPRAY | Freq: Two times a day (BID) | RESPIRATORY_TRACT | Status: DC
  Filled 2023-09-10: qty 8.8

## 2023-09-10 MED ORDER — ALTEPLASE 2 MG IJ SOLR: 2.0000 mg | Freq: Once | INTRAMUSCULAR | Status: DC | PRN

## 2023-09-10 MED ORDER — ANTICOAGULANT SODIUM CITRATE 4% (200MG/5ML) IV SOLN: 5.0000 mL | Status: DC | PRN

## 2023-09-10 MED ORDER — REVEFENACIN 175 MCG/3ML IN SOLN
175.0000 ug | Freq: Every day | RESPIRATORY_TRACT | Status: DC
  Administered 2023-09-11 – 2023-09-13 (×2): 175 ug via RESPIRATORY_TRACT
  Filled 2023-09-10 (×2): qty 3

## 2023-09-10 MED ORDER — TAB-A-VITE/IRON PO TABS
1.0000 | ORAL_TABLET | Freq: Every morning | ORAL | Status: DC
  Administered 2023-09-10 – 2023-09-13 (×4): 1 via ORAL
  Filled 2023-09-10 (×4): qty 1

## 2023-09-10 MED ORDER — HEPARIN SODIUM (PORCINE) 1000 UNIT/ML IJ SOLN
INTRAMUSCULAR | Status: AC
  Filled 2023-09-10: qty 4

## 2023-09-10 MED ORDER — POLYETHYLENE GLYCOL 3350 17 G PO PACK: 17.0000 g | PACK | Freq: Every day | ORAL | Status: DC | PRN

## 2023-09-10 MED ORDER — ALBUTEROL SULFATE (2.5 MG/3ML) 0.083% IN NEBU
2.5000 mg | INHALATION_SOLUTION | Freq: Four times a day (QID) | RESPIRATORY_TRACT | Status: DC | PRN
  Administered 2023-09-10: 2.5 mg via RESPIRATORY_TRACT
  Filled 2023-09-10: qty 3

## 2023-09-10 MED ORDER — LIDOCAINE HCL (PF) 1 % IJ SOLN: 5.0000 mL | INTRAMUSCULAR | Status: DC | PRN

## 2023-09-10 MED ORDER — MELATONIN 5 MG PO TABS: 5.0000 mg | ORAL_TABLET | Freq: Every evening | ORAL | Status: DC | PRN

## 2023-09-10 MED ORDER — CLOPIDOGREL BISULFATE 75 MG PO TABS
75.0000 mg | ORAL_TABLET | Freq: Every day | ORAL | Status: DC
  Administered 2023-09-11: 75 mg via ORAL
  Filled 2023-09-10: qty 1

## 2023-09-10 MED ORDER — LEVOTHYROXINE SODIUM 100 MCG PO TABS
175.0000 ug | ORAL_TABLET | Freq: Every day | ORAL | Status: DC
  Administered 2023-09-10 – 2023-09-13 (×4): 175 ug via ORAL
  Filled 2023-09-10 (×4): qty 1

## 2023-09-10 MED ORDER — HEPARIN SODIUM (PORCINE) 1000 UNIT/ML DIALYSIS: 1000.0000 [IU] | INTRAMUSCULAR | Status: DC | PRN

## 2023-09-10 MED ORDER — INSULIN ASPART 100 UNIT/ML IJ SOLN
0.0000 [IU] | Freq: Three times a day (TID) | INTRAMUSCULAR | Status: DC
  Administered 2023-09-11: 1 [IU] via SUBCUTANEOUS
  Administered 2023-09-12: 2 [IU] via SUBCUTANEOUS
  Administered 2023-09-13: 1 [IU] via SUBCUTANEOUS

## 2023-09-10 MED ORDER — THIAMINE MONONITRATE 100 MG PO TABS
100.0000 mg | ORAL_TABLET | Freq: Every morning | ORAL | Status: DC
  Administered 2023-09-10 – 2023-09-13 (×3): 100 mg via ORAL
  Filled 2023-09-10 (×4): qty 1

## 2023-09-10 MED ORDER — PROCHLORPERAZINE EDISYLATE 10 MG/2ML IJ SOLN
5.0000 mg | Freq: Four times a day (QID) | INTRAMUSCULAR | Status: DC | PRN
  Administered 2023-09-12: 5 mg via INTRAVENOUS
  Filled 2023-09-10: qty 2

## 2023-09-10 MED ORDER — APIXABAN 5 MG PO TABS
5.0000 mg | ORAL_TABLET | Freq: Two times a day (BID) | ORAL | Status: DC
  Administered 2023-09-10: 5 mg via ORAL
  Filled 2023-09-10: qty 1

## 2023-09-10 MED ORDER — CHLORHEXIDINE GLUCONATE CLOTH 2 % EX PADS
6.0000 | MEDICATED_PAD | Freq: Every day | CUTANEOUS | Status: DC
  Administered 2023-09-11 – 2023-09-13 (×3): 6 via TOPICAL

## 2023-09-10 MED ORDER — SEVELAMER CARBONATE 800 MG PO TABS
1600.0000 mg | ORAL_TABLET | Freq: Three times a day (TID) | ORAL | Status: DC
  Administered 2023-09-10 – 2023-09-13 (×9): 1600 mg via ORAL
  Filled 2023-09-10 (×10): qty 2

## 2023-09-10 NOTE — Consult Note (Signed)
WOC Nurse Consult Note: patient familiar to [facility] team from previous consult; has had R forearm wound since hospitalization back in October per patient; seen last by [facility] 08/22/2023 and ordered [facility]  Reason for Consult: Chronic R forearm wound  Wound type: full thickness unknown etiology ? Trauma  Pressure Injury POA: NA  Measurement: 3 cm x 2.5 cm Wound bed:100% black eschar, ring of yellow tissue noted from 1 o'clock to 5 o'clock Drainage (amount, consistency, odor)  none  Periwound: intact  Dressing procedure/placement/frequency: Cleanse R forearm wound with Vashe wound cleanser [facility] [place] [phone]),  Apply 1/4" thick layer of Santyl to wound bed, top with small piece of saline moist gauze. Top with dry gauze and silicone foam.   Ok to lift foam daily for change of gauze and reapplication of Santyl. Change silicone foam every 3 days and prn.    Patient would benefit from referral to wound care center for ongoing management of this wound that has apparently not responded to previous attempts at debridement.    POC discussed with patient and bedside nurse. WOC team will not follow. Re-consult if further needs arise.   Thank you,    Priscella Mann MSN, RN-BC, [employer] [phone]

## 2023-09-10 NOTE — Assessment & Plan Note (Addendum)
No chest pain, no clinical signs of acute coronary syndrome. Elevated troponin likely due to volume overload.

## 2023-09-10 NOTE — Assessment & Plan Note (Signed)
Calculated BMI is 37

## 2023-09-10 NOTE — ED Notes (Signed)
Pt reports she is SOB and wants to go on the bipap again.  Respiratory called and now bedside.

## 2023-09-10 NOTE — Consult Note (Signed)
Cardiology Consultation   Patient ID: [name] [name] MRN: [id number]; DOB: July 05, 1951  Admit date: 09/09/2023 Date of Consult: 09/10/2023  PCP:  Vladimir Crofts, FNP   [facility] Providers Cardiologist:  Nanetta Batty, MD  Advanced Heart Failure:  Marca Ancona, MD  {    Patient Profile:   [name] [name] is a 72 y.o. female with a hx of nonobstructive CAD, chronic combined systolic and diastolic heart failure, nonischemic cardiomyopathy, paroxysmal atrial fibrillation, paroxysmal SVT, mild aortic stenosis, hypertension, hyperlipidemia, type 2 diabetes, ESRD on hemodialysis TTS, hypothyroidism, PVD s/p right femoral artery stenting, TIA, anemia, OSA, oxygen dependent COPD, non-small cell lung cancer status post radiation/in remission, obesity, who is being seen 09/10/2023 for the evaluation of elevated troponin and CHF at the request of Dr. Margo Aye.  History of Present Illness:   Ms. Wellens with above past medical history presented to the ER yesterday complaining shortness of breath.  She felt increasingly short of breath at rest yesterday, along with some chest tightness.  She had used her nebulizer treatment at home which did not offer much relief.  She has been compliant with her medication as well as dialysis. She has chronic intermittent chest pain and tightness with exertion or with stress, she felt this was worse yesterday. She endorses mild leg swelling, mild non-productive cough, and felt she had a fever yesterday morning. She felt her symptoms had improved since she has been in the ER. She states she does not take Crestor because it's causing muscle pain and she does not care if it's for her heart disease. She felt the medication were stuck in her throat this morning after taking her pills.   Admission diagnostic from 09/09/2023 revealed hyperkalemia 5.7, glucose 198, BUN 105, creatinine 8.09, anion gap 19, albumin 3.3, GFR 5.  CBC with leukocytosis 16 700, hemoglobin  9.2.  BNP 1611.  High sensitive troponin 153 >161 >178 >172.  VBG was pH 7.34. Chest x-ray revealed mild to moderate pulmonary vascular congestion, moderate to marked severely left basilar atelectasis/infiltrate, small to moderate left pleural effusion.EKG on 09/09/2023 at 2015 reveals sinus rhythm at 92 bpm, PVCs, nonspecific IVCD no acute change.  Repeat EKG today largely unchanged.  Cardiology is consulted for elevated troponin and CHF.   Per chart review, patient follows Dr. Allyson Sabal and advanced heart failure clinic (until 2023) outpatient.  Heart cath 2015 revealed no significant CAD.  Echo 11/2021 revealing LVEF 40-45, mild LVH, grade 1 DD, severe LAE, mild AS with mean gradient 8 mmHg. She was admitted 04/13/2023 for chest pain, echo at the time revealing LVEF 35 to 40%, no RWMA.  She was recommended outpatient PET scan, which was completed on 06/05/2023, revealing a high risk study that is consistent with a small area of infarction with peri-infarct ischemia in the inferior wall.  She was subsequently arranged left heart catheterization on 07/05/2023 which revealed nonobstructive CAD with 40% prox to mid Cx, 30% mid Cx, 10% dis LAD.  She was recommended medical therapy for CAD with Plavix 75 mg daily, Coreg 6.25 mg twice daily, Crestor 20 mg.  Note she has history of angioedema to ACE inhibitor.  Most recent echo was completed on 04/15/2023 with LVEF 35 to 40%, global hypokinesis, grade 1 DD, normal RV, PASP 47.3 mmHg, mild LAE, trivial MR, no AS.  GDMT historically is limited to Coreg, due to intolerance to BiDil with dizziness and headache and vision change, refusal of amlodipine, and advanced renal disease.  She returned to the hospital 07/11/2023 after dialysis because she was found lethargic at the completion of her session.  She was found in respiratory failure with acidosis requiring BiPAP support and hypotensive requiring midodrine support.  She developed paroxysmal SVT into the 180s on 07/16/2023 and  A-fib RVR on 07/18/2023.  Cardiology was consulted, she did report chest tightness and palpitation heart rate control was improving with IV metoprolol.  She was placed on metoprolol 37.5 mg twice daily and loaded amiodarone during that admission.  She was also started on Eliquis 5 mg twice daily for CHA2DS2-VASc score of 8.   She last followed up with Idelle Jo NP-C on 08/19/2023, reports overall stable intermittent chest discomfort when she lay down or with significant stress, stable chronic dyspnea, and intermittent low blood pressure with dialysis.  She was felt overall stable and recommended continue above medical therapy regimen.    Past Medical History:  Diagnosis Date   Anemia    Arthritis    Asthma    Chronic diastolic (congestive) heart failure (HCC)    Chronic kidney disease    Dialysis T-TH-Sat   Chronic pain syndrome    Class 2 obesity due to excess calories with body mass index (BMI) of 36.0 to 36.9 in adult    COPD (chronic obstructive pulmonary disease) (HCC)    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   Full dentures    GERD (gastroesophageal reflux disease)    Gout    History of hiatal hernia    History of transfusion    Hx MRSA infection    abscess left groin   Hyperlipidemia    Hypertension    Hypothyroid    Intractable nausea and vomiting 02/15/2021   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HCC)    Peripheral vascular disease (HCC)    Sleep apnea    UNABLE TO TOLERATE C PAP   Stress incontinence    TIA (transient ischemic attack)     X2 NO RESIDUAL PROBLEMS   Wears glasses     Past Surgical History:  Procedure Laterality Date   ABDOMINAL HYSTERECTOMY     AV FISTULA PLACEMENT Left 12/01/2021   Procedure: INSERTION OF LEFT ARM ARTERIOVENOUS (AV) GORE-TEX GRAFT;  Surgeon: Nada Libman, MD;  Location: [facility] OR;  Service: Vascular;  Laterality: Left;   BACK SURGERY  2020   BASCILIC VEIN TRANSPOSITION Left 04/05/2020   Procedure:  BASILIC VEIN TRANSPOSITION FIRST STAGE;  Surgeon: Chuck Hint, MD;  Location: [facility] OR;  Service: Vascular;  Laterality: Left;   BIOPSY  08/28/2023   Procedure: BIOPSY;  Surgeon: Kerin Salen, MD;  Location: [facility] ENDOSCOPY;  Service: Gastroenterology;;   BOTOX INJECTION  08/28/2023   Procedure: BOTOX INJECTION;  Surgeon: Kerin Salen, MD;  Location: [facility] ENDOSCOPY;  Service: Gastroenterology;;   BRONCHIAL BIOPSY  01/23/2022   Procedure: BRONCHIAL BIOPSIES;  Surgeon: Josephine Igo, DO;  Location: [facility] ENDOSCOPY;  Service: Pulmonary;;   BRONCHIAL BIOPSY  08/07/2022   Procedure: BRONCHIAL BIOPSIES;  Surgeon: Josephine Igo, DO;  Location: [facility] ENDOSCOPY;  Service: Pulmonary;;   BRONCHIAL BRUSHINGS  08/07/2022   Procedure: BRONCHIAL BRUSHINGS;  Surgeon: Josephine Igo, DO;  Location: [facility] ENDOSCOPY;  Service: Pulmonary;;   BRONCHIAL NEEDLE ASPIRATION BIOPSY  01/23/2022   Procedure: BRONCHIAL NEEDLE ASPIRATION BIOPSIES;  Surgeon: Josephine Igo, DO;  Location: [facility] ENDOSCOPY;  Service: Pulmonary;;   BRONCHIAL NEEDLE ASPIRATION BIOPSY  08/07/2022   Procedure: BRONCHIAL NEEDLE ASPIRATION BIOPSIES;  Surgeon: Josephine Igo, DO;  Location: [facility] ENDOSCOPY;  Service: Pulmonary;;   COLON SURGERY  1995   DUE TO POLYP   DILATION AND CURETTAGE OF UTERUS     ENDOBRONCHIAL ULTRASOUND  01/23/2022   Procedure: ENDOBRONCHIAL ULTRASOUND;  Surgeon: Josephine Igo, DO;  Location: [facility] ENDOSCOPY;  Service: Pulmonary;;   ENTEROSCOPY N/A 07/10/2021   Procedure: ENTEROSCOPY;  Surgeon: Kerin Salen, MD;  Location: [facility] ENDOSCOPY;  Service: Gastroenterology;  Laterality: N/A;   ESOPHAGEAL DILATION  08/28/2023   Procedure: ESOPHAGEAL BALLOON  DILATION;  Surgeon: Kerin Salen, MD;  Location: [facility] ENDOSCOPY;  Service: Gastroenterology;;   ESOPHAGOGASTRODUODENOSCOPY N/A 01/25/2021   Procedure: ESOPHAGOGASTRODUODENOSCOPY (EGD);  Surgeon: Vida Rigger, MD;  Location: [facility] ENDOSCOPY;  Service: Endoscopy;  Laterality: N/A;    ESOPHAGOGASTRODUODENOSCOPY (EGD) WITH PROPOFOL N/A 08/28/2023   Procedure: ESOPHAGOGASTRODUODENOSCOPY (EGD) WITH PROPOFOL;  Surgeon: Kerin Salen, MD;  Location: [facility] ENDOSCOPY;  Service: Gastroenterology;  Laterality: N/A;   FINE NEEDLE ASPIRATION  01/23/2022   Procedure: FINE NEEDLE ASPIRATION (FNA) LINEAR;  Surgeon: Josephine Igo, DO;  Location: [facility] ENDOSCOPY;  Service: Pulmonary;;   FOREARM FRACTURE SURGERY     Left arm   GIVENS CAPSULE STUDY N/A 07/07/2021   Procedure: GIVENS CAPSULE STUDY;  Surgeon: Vida Rigger, MD;  Location: [facility] ENDOSCOPY;  Service: Endoscopy;  Laterality: N/A;   HERNIA REPAIR     w/ mesh   HOT HEMOSTASIS N/A 07/10/2021   Procedure: HOT HEMOSTASIS (ARGON PLASMA COAGULATION/BICAP);  Surgeon: Kerin Salen, MD;  Location: [facility] ENDOSCOPY;  Service: Gastroenterology;  Laterality: N/A;   INCISION AND DRAINAGE ABSCESS Left 02/04/2013   Procedure: INCISION AND DRAINAGE LEFT BUTTOCK ABSCESS; INCISION AND DRAINAGE LEFT BREAST ABSCESS;  Surgeon: Shelly Rubenstein, MD;  Location: [facility] OR;  Service: General;  Laterality: Left;   INCISION AND DRAINAGE ABSCESS N/A 02/12/2013   Procedure: INCISION AND DEBRIDEMENT BUTTOCK WOUND ;  Surgeon: Wilmon Arms. Corliss Skains, MD;  Location: [facility] OR;  Service: General;  Laterality: N/A;   INCISION AND DRAINAGE ABSCESS N/A 02/14/2013   Procedure: INCISION AND DRAINAGE/DRESSING CHANGE;  Surgeon: Shelly Rubenstein, MD;  Location: [facility] OR;  Service: General;  Laterality: N/A;   INCISION AND DRAINAGE ABSCESS N/A 03/21/2015   Procedure: INCISION AND DRAINAGE PUBIC ABSCESS;  Surgeon: Glenna Fellows, MD;  Location: [facility] ORS;  Service: General;  Laterality: N/A;   INCISION AND DRAINAGE PERIRECTAL ABSCESS Left 02/10/2013   Procedure: IRRIGATION AND DEBRIDEMENT OF BUTTOCK/PERINEAL ABSCESS;  Surgeon: Wilmon Arms. Corliss Skains, MD;  Location: [facility] OR;  Service: General;  Laterality: Left;   INCISION AND DRAINAGE PERIRECTAL ABSCESS N/A 02/16/2013   Procedure: IRRIGATION AND DEBRIDEMENT PERINEAL  ABSCESS;  Surgeon: Liz Malady, MD;  Location: [facility] OR;  Service: General;  Laterality: N/A;   IR FLUORO GUIDE CV LINE RIGHT  11/30/2021   IR FLUORO GUIDE CV LINE RIGHT  08/22/2023   IR REMOVAL TUN CV CATH W/O FL  08/29/2023   IR US GUIDE VASC ACCESS RIGHT  11/30/2021   IR US GUIDE VASC ACCESS RIGHT  08/22/2023   IRRIGATION AND DEBRIDEMENT ABSCESS N/A 02/06/2013   Procedure: IRRIGATION AND DEBRIDEMENT BUTTOCK ABSCESS AND DRESSING CHANGE;  Surgeon: Shelly Rubenstein, MD;  Location: [facility] OR;  Service: General;  Laterality: N/A;   IRRIGATION AND DEBRIDEMENT ABSCESS Left 02/08/2013   Procedure: IRRIGATION AND DEBRIDEMENT ABSCESS/DRESSING CHANGE;  Surgeon: Cherylynn Ridges, MD;  Location: [facility] OR;  Service: General;  Laterality: Left;   JOINT REPLACEMENT  2010 / 2012    LAPAROSCOPIC CHOLECYSTECTOMY  LEFT HEART CATH AND CORONARY ANGIOGRAPHY N/A 07/05/2023   Procedure: LEFT HEART CATH AND CORONARY ANGIOGRAPHY;  Surgeon: Lennette Bihari, MD;  [facility]: [facility] INVASIVE CV LAB;  Service: Cardiovascular;  Laterality: N/A;   LEFT HEART CATHETERIZATION WITH CORONARY ANGIOGRAM N/A 07/02/2014   Procedure: LEFT HEART CATHETERIZATION WITH CORONARY ANGIOGRAM;  Surgeon: Runell Gess, MD;  Location: [facility] CATH LAB;  Service: Cardiovascular;  Laterality: N/A;   LOWER EXTREMITY ANGIOGRAM Right 04/20/2016   Procedure: Lower Extremity Angiogram;  Surgeon: Sherren Kerns, MD;  Location: [facility] INVASIVE CV LAB;  Service: Cardiovascular;  Laterality: Right;   MULTIPLE TOOTH EXTRACTIONS     PERIPHERAL VASCULAR CATHETERIZATION N/A 04/20/2016   Procedure: Abdominal Aortogram;  Surgeon: Sherren Kerns, MD;  Location: [facility] INVASIVE CV LAB;  Service: Cardiovascular;  Laterality: N/A;   PERIPHERAL VASCULAR CATHETERIZATION Right 04/20/2016   Procedure: Peripheral Vascular Intervention;  Surgeon: Sherren Kerns, MD;  Location: [facility] INVASIVE CV LAB;  Service: Cardiovascular;  Laterality: Right;  popiteal   RIGHT HEART CATH N/A 10/27/2018    Procedure: RIGHT HEART CATH;  Surgeon: Laurey Morale, MD;  Location: [facility] INVASIVE CV LAB;  Service: Cardiovascular;  Laterality: N/A;   RIGHT HEART CATH N/A 03/20/2019   Procedure: RIGHT HEART CATH;  Surgeon: Laurey Morale, MD;  Location: [facility] INVASIVE CV LAB;  Service: Cardiovascular;  Laterality: N/A;   THYROIDECTOMY     TOTAL HIP REVISION Right 08/11/2014   Procedure: RIGHT ACETABULAR REVISION;  Surgeon: Loanne Drilling, MD;  Location: [facility] ORS;  Service: Orthopedics;  Laterality: Right;   VASCULAR SURGERY     VIDEO BRONCHOSCOPY WITH ENDOBRONCHIAL ULTRASOUND Bilateral 08/07/2022   Procedure: VIDEO BRONCHOSCOPY WITH ENDOBRONCHIAL ULTRASOUND;  Surgeon: Josephine Igo, DO;  Location: [facility] ENDOSCOPY;  Service: Pulmonary;  Laterality: Bilateral;   VIDEO BRONCHOSCOPY WITH RADIAL ENDOBRONCHIAL ULTRASOUND  01/23/2022   Procedure: RADIAL ENDOBRONCHIAL ULTRASOUND;  Surgeon: Josephine Igo, DO;  Location: [facility] ENDOSCOPY;  Service: Pulmonary;;     Home Medications:  Prior to Admission medications   Medication Sig Start Date End Date Taking? Authorizing Provider  acetaminophen (TYLENOL) 500 MG tablet Take 1 tablet (500 mg total) by mouth See admin instructions. Take 1,000mg  (2 tablets) by mouth twice a day in addition to 1,000mg  (2 tablets) twice daily as needed for pain. Patient taking differently: Take 1,000 mg by mouth in the morning and at bedtime. 05/14/23  Yes Ghimire, Werner Lean, MD  albuterol (PROVENTIL) (2.5 MG/3ML) 0.083% nebulizer solution Take 3 mLs (2.5 mg total) by nebulization every 6 (six) hours as needed for wheezing or shortness of breath. Patient taking differently: Take 2.5 mg by nebulization 3 (three) times daily. 05/14/23  Yes Ghimire, Werner Lean, MD  albuterol (VENTOLIN HFA) 108 (90 Base) MCG/ACT inhaler Inhale 2 puffs into the lungs every 6 (six) hours as needed for shortness of breath (COPD). 05/14/23  Yes Ghimire, Werner Lean, MD  allopurinol (ZYLOPRIM) 100 MG tablet Take 1 tablet (100 mg  total) by mouth daily. 09/11/23  Yes Leroy Sea, MD  budesonide-formoterol [facility]) 160-4.5 MCG/ACT inhaler Inhale 2 puffs into the lungs in the morning and at bedtime. 05/14/23  Yes Ghimire, Werner Lean, MD  clopidogrel (PLAVIX) 75 MG tablet Take 75 mg by mouth daily.   Yes [provider]  colchicine 0.6 MG tablet Take 0.5 tablets (0.3 mg total) by mouth daily. 08/29/23  Yes Leroy Sea, MD  diclofenac Sodium (VOLTAREN) 1 % GEL Apply 2 g topically 4 (four) times daily.  Patient taking differently: Apply 1 Application topically daily. 04/17/23  Yes Marlin Canary U, DO  docusate sodium (COLACE) 100 MG capsule Take 1 capsule (100 mg total) by mouth daily. 05/14/23  Yes Ghimire, Werner Lean, MD  fluticasone (FLONASE) 50 MCG/ACT nasal spray Place 1 spray into both nostrils daily. Patient taking differently: Place 2 sprays into both nostrils daily. 05/14/23  Yes Ghimire, Werner Lean, MD  insulin lispro (HUMALOG KWIKPEN) 100 UNIT/ML KwikPen 0-15 Units, Subcutaneous, 3 times daily with meal CBG < 70: Implement Hypoglycemia meausers CBG 70 - 120: 0 units CBG 121 - 150: 2 units CBG 151 - 200: 3 units CBG 201 - 250: 5 units CBG 251 - 300: 8 units CBG 301 - 350: 11 units CBG 351 - 400: 15 units CBG > 400: call MD Patient taking differently: Inject 0-15 Units into the skin daily. 05/14/23  Yes Ghimire, Werner Lean, MD  levothyroxine (SYNTHROID) 175 MCG tablet Take 1 tablet (175 mcg total) by mouth daily. 05/14/23  Yes Ghimire, Werner Lean, MD  lidocaine-prilocaine (EMLA) cream Apply 1 Application topically See admin instructions. On dialysis days (Tues,Thurs,Sat) 02/13/22  Yes [provider]  meclizine (ANTIVERT) 25 MG tablet Take 25 mg by mouth as needed for dizziness or nausea. 06/12/23  Yes [provider]  Multiple Vitamins-Iron (MULTIVITAMINS WITH IRON) TABS tablet Take 1 tablet by mouth in the morning. 05/14/23  Yes Ghimire, Werner Lean, MD  OXYGEN Inhale 3 L into the lungs.   Yes [provider]  sevelamer carbonate (RENVELA) 800 MG tablet Take 2 tablets (1,600 mg total) by mouth 3 (three) times daily with meals. 05/14/23  Yes Ghimire, Werner Lean, MD  thiamine (VITAMIN B1) 100 MG tablet Take 1 tablet (100 mg total) by mouth in the morning. 05/14/23  Yes Ghimire, Werner Lean, MD  apixaban (ELIQUIS) 5 MG TABS tablet Take 1 tablet (5 mg total) by mouth 2 (two) times daily. 07/20/23   Ghimire, Werner Lean, MD  metoprolol tartrate (LOPRESSOR) 25 MG tablet Take 1.5 tablets (37.5 mg total) by mouth 2 (two) times daily. Patient not taking: Reported on 09/10/2023 07/20/23   Maretta Bees, MD  midodrine (PROAMATINE) 5 MG tablet Take 3 tablets (15 mg total) by mouth every 8 (eight) hours. Patient not taking: Reported on 09/10/2023 07/20/23   Maretta Bees, MD  nitroGLYCERIN (NITROSTAT) 0.4 MG SL tablet Place 1 tablet (0.4 mg total) under the tongue every 5 (five) minutes as needed for chest pain. Patient not taking: Reported on 09/10/2023 07/06/23 07/05/24  Cyndi Bender, NP  rosuvastatin (CRESTOR) 20 MG tablet Take 1 tablet (20 mg total) by mouth daily. Patient not taking: Reported on 09/10/2023 07/06/23   Cyndi Bender, NP    Inpatient Medications: Scheduled Meds:  apixaban  5 mg Oral BID   levothyroxine  175 mcg Oral Daily   mometasone-formoterol  2 puff Inhalation BID   multivitamins with iron  1 tablet Oral q AM   sevelamer carbonate  1,600 mg Oral TID WC   thiamine  100 mg Oral q AM   Continuous Infusions:  PRN Meds: acetaminophen, albuterol, melatonin, polyethylene glycol, prochlorperazine  Allergies:    Allergies  Allergen Reactions   Nebivolol Swelling    Chest pain (reaction to Bystolic)   Zestril [Lisinopril] Swelling and Other (See Comments)   Ace Inhibitors Swelling and Other (See Comments)    Tongue swell   Morphine And Codeine Itching    Social History:   Social History   Socioeconomic  History   Marital status: Legally Separated    Spouse name: Not on file    Number of children: 1   Years of education: college   Highest education level: Not on file  Occupational History    Comment: Disabled  Tobacco Use   Smoking status: Former    Current packs/day: 0.00    Average packs/day: 0.3 packs/day for 40.0 years (10.0 ttl pk-yrs)    Types: Cigarettes    Start date: 10/1981    Quit date: 10/2021    Years since quitting: 1.9    Passive exposure: Past   Smokeless tobacco: Former  Building services engineer status: Never Used  Substance and Sexual Activity   Alcohol use: Yes    Alcohol/week: 1.0 standard drink of alcohol    Types: 1 Standard drinks or equivalent per week    Comment: OCC   Drug use: No   Sexual activity: Not Currently    Birth control/protection: Surgical    Comment: Hysterectomy  Other Topics Concern   Not on file  Social History Narrative   ** Merged History Encounter **       Patient is separated  and lives at home with her friend. Patient is disabled. Education one year of college Right handed Caffeine two cups daily.     Social Determinants of Health   Financial Resource Strain: Not on file  Food Insecurity: No Food Insecurity (08/20/2023)   Hunger Vital Sign    Worried About Running Out of Food in the Last Year: Never true    Ran Out of Food in the Last Year: Never true  Transportation Needs: No Transportation Needs (08/20/2023)   PRAPARE - Administrator, Civil Service (Medical): No    Lack of Transportation (Non-Medical): No  Physical Activity: Not on file  Stress: Not on file  Social Connections: Not on file  Intimate Partner Violence: Not At Risk (08/20/2023)   Humiliation, Afraid, Rape, and Kick questionnaire    Fear of Current or Ex-Partner: No    Emotionally Abused: No    Physically Abused: No    Sexually Abused: No    Family History:    Family History  Problem Relation Age of Onset   Diabetes Brother    Cardiomyopathy Mother    Heart disease Mother    Cancer Father      ROS:   Constitutional: Denied fever, chills, malaise, night sweats Eyes: Denied vision change or loss Ears/Nose/Mouth/Throat: Denied ear ache, sore throat, coughing, sinus pain Cardiovascular:see HPI  Respiratory: see HPI  Gastrointestinal: Denied nausea, vomiting, abdominal pain, diarrhea Genital/Urinary: Denied dysuria, hematuria, urinary frequency/urgency Musculoskeletal: Denied muscle ache, joint pain, weakness Skin: Denied rash, wound Neuro: Denied headache, dizziness, syncope Psych: Denied history of depression/anxiety  Endocrine: history of diabetes     Physical Exam/Data:   Vitals:   09/10/23 0215 09/10/23 0500 09/10/23 0630 09/10/23 0700  BP: (!) 122/45 138/85 (!) 121/53 (!) 115/56  Pulse: 70 77 75 75  Resp:  (!) 24  18  Temp:   (!) 96.6 F (35.9 C)   TempSrc:   Axillary   SpO2: 100% 95% 100% 100%   No intake or output data in the 24 hours ending 09/10/23 0911    08/30/2023    5:00 AM 08/29/2023    6:07 PM 08/29/2023    2:37 PM  Last 3 Weights  Weight (lbs) 212 lb 8.4 oz 212 lb 11.9 oz 219 lb 5.7 oz  Weight (kg) 96.4  kg 96.5 kg 99.5 kg     There is no height or weight on file to calculate BMI.   Vitals:  Vitals:   09/10/23 0630 09/10/23 0700  BP: (!) 121/53 (!) 115/56  Pulse: 75 75  Resp:  18  Temp: (!) 96.6 F (35.9 C)   SpO2: 100% 100%   General Appearance: In no apparent distress, laying in bed, morbidly obese  HEENT: Normocephalic, atraumatic.  Neck: Supple, trachea midline, no JVDs Cardiovascular: Regular rate and rhythm, normal S1-S2,  no murmur  Respiratory: Resting breathing unlabored, mild DOE with conversation, lungs sounds with bibasilar rhonchi and fine crackles, on [place] oxygen, speaks short sentence  Gastrointestinal: Bowel sounds positive, abdomen soft, obese Extremities: Able to move all extremities in bed without difficulty, trace edema of BLE Musculoskeletal: Normal muscle bulk and tone Skin: Intact, warm, dry.  Neurologic: Alert, oriented  to person, place and time.  no gross focal neuro deficit Psychiatric: Flat affect      EKG:  The EKG was personally reviewed and demonstrates:    EKG on 09/09/2023 at 2015 reveals sinus rhythm at 92 bpm, PVCs, nonspecific IVCD no acute change.    Repeat EKG today revealed sinus rhythm, resolved PVCs, specific IVCD, largely unchanged.  Telemetry:  Telemetry was personally reviewed and demonstrates:    Sinus rhythm, occasional PVCs   Relevant CV Studies:   Left heart catheterization 07/05/2023:    Prox Cx to Mid Cx lesion is 40% stenosed.   Mid Cx lesion is 30% stenosed.   Dist LAD lesion is 10% stenosed.   Recommend Aspirin 81mg  daily for moderate CAD.   There is significant coronary calcification involving the LAD and left circumflex vessel.    Nonobstructive CAD with smooth 10% mid LAD narrowings and left circumflex calcification with 40% proximal stenosis and smooth 30% mid AV groove stenosis.   Normal nondominant RCA.   RECOMMENDATION: Medical therapy for mild nonobstructive CAD.  Guideline directed therapy with reduced LV function in this patient with end-stage renal disease.  Since the patient does not have anyone at home, she will stay overnight for observation.  Plans are for dialysis tomorrow and apparently it has been arranged that she would be picked up to leave the hospital in early a.m.and taken for her dialysis.    Stress PET 06/05/2023:   Findings are consistent with a small area of infarction with peri-infarct ischemia in the inferior wall. This study is high risk. Consider cardiac catheterization given reduced myocardial blood flow reserve, severe coronary calcifications, reduced EF with no augmentation with stress, and perfusion defect.   LV perfusion is abnormal. Defect 1: There is a small defect with mild reduction in uptake present in the apical to mid inferior location(s) that is partially reversible. There is abnormal wall motion in the defect area. Consistent  with infarction and peri-infarct ischemia.   Rest left ventricular function is abnormal. Rest global function is severely reduced. Rest EF: 25%. Stress left ventricular function is abnormal. Stress global function is severely reduced. Stress EF: 28%. Ejection fraction may be inaccurately gated in the setting frequent ectopy. End diastolic cavity size is severely enlarged. End systolic cavity size is severely enlarged. No evidence of transient ischemic dilation (TID) noted.   Myocardial blood flow was computed to be 0.63ml/g/min at rest and 1.6ml/g/min at stress. Global myocardial blood flow reserve was 1.54 and was abnormal. Challenging to accurately interpret MBFR in the setting of ESRD.   Coronary calcium was present on the attenuation correction  CT images. Severe coronary calcifications were present. Coronary calcifications were present in the left anterior descending artery and left circumflex artery distribution(s).   Electronically signed by: Parke Poisson, MD   Echocardiogram 04/15/2023:  1. Left ventricular ejection fraction, by estimation, is 35 to 40%. The  left ventricle has moderately decreased function. The left ventricle  demonstrates global hypokinesis. The left ventricular internal cavity size  was mildly dilated. Left ventricular  diastolic parameters are consistent with Grade I diastolic dysfunction  (impaired relaxation).   2. Right ventricular systolic function is normal. The right ventricular  size is normal. There is moderately elevated pulmonary artery systolic  pressure. The estimated right ventricular systolic pressure is 47.3 mmHg.   3. Left atrial size was mildly dilated.   4. The mitral valve is normal in structure. Trivial mitral valve  regurgitation. No evidence of mitral stenosis.   5. The aortic valve is tricuspid. Aortic valve regurgitation is not  visualized. No aortic stenosis is present. Aortic valve mean gradient  measures 7.0 mmHg.   6. The inferior  vena cava is dilated in size with <50% respiratory  variability, suggesting right atrial pressure of 15 mmHg.    Laboratory Data:  High Sensitivity Troponin:   Recent Labs  Lab 08/20/23 1146 09/09/23 2149 09/10/23 0008 09/10/23 0534 09/10/23 0712  TROPONINIHS 83* 153* 161* 178* 172*     Chemistry Recent Labs  Lab 09/09/23 2149 09/09/23 2152 09/10/23 0534 09/10/23 0540  NA 142 138 140 137  K 5.7* 5.6* 6.0* 5.8*  CL 98  --  98  --   CO2 25  --  22  --   GLUCOSE 198*  --  205*  --   BUN 105*  --  106*  --   CREATININE 8.09*  --  8.31*  --   CALCIUM 10.1  --  10.0  --   GFRNONAA 5*  --  5*  --   ANIONGAP 19*  --  20*  --     Recent Labs  Lab 09/09/23 2149 09/10/23 0534  PROT 6.7  --   ALBUMIN 3.3* 3.3*  AST 27  --   ALT 17  --   ALKPHOS 102  --   BILITOT 0.6  --    Lipids No results for input(s): "CHOL", "TRIG", "HDL", "LABVLDL", "LDLCALC", "CHOLHDL" in the last 168 hours.  Hematology Recent Labs  Lab 09/09/23 2149 09/09/23 2152 09/10/23 0534 09/10/23 0540  WBC 16.7*  --  15.0*  --   RBC 2.95*  --  2.99*  --   HGB 9.2* 10.9* 9.1* 10.5*  HCT 31.1* 32.0* 32.2* 31.0*  MCV 105.4*  --  107.7*  --   MCH 31.2  --  30.4  --   MCHC 29.6*  --  28.3*  --   RDW 15.8*  --  16.0*  --   PLT 239  --  226  --    Thyroid No results for input(s): "TSH", "FREET4" in the last 168 hours.  BNP Recent Labs  Lab 09/09/23 2149  BNP 1,611.2*    DDimer No results for input(s): "DDIMER" in the last 168 hours.   Radiology/Studies:  DG Chest Port 1 View  Result Date: 09/09/2023 CLINICAL DATA:  Shortness of breath. EXAM: PORTABLE CHEST 1 VIEW COMPARISON:  August 24, 2023 FINDINGS: The right-sided venous catheter seen on the prior study has been removed. There is stable moderate severity cardiac silhouette enlargement. Mild diffusely increased interstitial lung markings are seen with  mild to moderate severity prominence of the central pulmonary vasculature. Moderate to marked  severity left basilar atelectasis and/or infiltrate is noted. There is a small to moderate size left pleural effusion. No pneumothorax is identified. Multilevel degenerative changes seen throughout the thoracic spine. IMPRESSION: 1. Cardiomegaly with mild to moderate severity pulmonary vascular congestion. 2. Moderate to marked severity left basilar atelectasis and/or infiltrate. 3. Small to moderate size left pleural effusion. Electronically Signed   By: Aram Candela M.D.   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 -Presented with increased shortness of breath at rest yesterday along with intermittent acute on chronic chest tightness, cough, feeling feverish  -BNP 1611 (was 1345 on 08/20/2023) -High sensitive troponin 153 >161 >178 >172 (previously 80-150) -Chest x-ray revealing mild to moderate pulmonary vascular congestion, moderate to severe left basilar atelectasis/infiltrate, small to moderate left pleural effusion -Clinical picture is likely multifactorial from CHF decompensation, volume overload with ESRD, possible pneumonia with new leukocytosis, please consider check procalcitonin / viral screening / CT chest for further evaluation of infection -Continue dialysis for volume management, she is no longer diuretic responding -GDMT: Historically limited due to advanced renal disease, angioedema, intolerance to BiDil, refusal to amlodipine; continue PTA metoprolol 37.5 mg twice daily, may transition to Toprol XL 75 mg at the time of discharge; would avoid midodrine if able   Elevated troponin Chronic angina None obstructive CAD -High sensitive troponin low flat trend, in the setting of ESRD, suspect demand ischemia -Recent cath 06/2023 with nonobstructive CAD -Continue medical therapy with Plavix 75 mg daily,  beta-blocker as above; she refuse statin -Antianginal therapy is limited due to intolerance to nitrate,  refusal of amlodipine, advanced renal disease limiting Ranexa  Paroxysmal A-fib -Currently in sinus rhythm, continue PTA metoprolol 37.5 mg twice daily and Eliquis 5 mg twice daily -She has stopped amiodarone, it was started 07/18/2023, completed a month, given underlying lung cancer and COPD, would avoid long-term use if able/no need to resume   Leukocytosis Chronic hypoxic respiratory failure COPD ESRD on hemodialysis Type 2 diabetes Chronic anemia -Per primary team    Risk Assessment/Risk Scores:    New York Heart Association (NYHA) Functional Class NYHA Class II  CHA2DS2-VASc Score = 8   This indicates a 10.8% annual risk of stroke. The patient's score is based upon: CHF History: 1 HTN History: 1 Diabetes History: 1 Stroke History: 2 Vascular Disease History: 1 Age Score: 1 Gender Score: 1    For questions or updates, please contact [facility] Please consult [email or web address] for contact info under    Signed, Cyndi Bender, NP  09/10/2023 9:11 AM  Agree with A [facility] P of [facility] Fitch NP  Asked to see Ms. Biondo because of respiratory failure and chest heaviness.  She sees me as an outpatient although it has been several years.  Her history includes a nonischemic cardiomyopathy status post heart catheterization 2 months ago that showed minimal nonobstructive CAD.  She has an EF of 35 to 40% performed 04/15/2023 with grade 1 diastolic dysfunction.  Her other problems as outlined include end-stage renal disease on hemodialysis, PAF maintaining sinus rhythm on Eliquis.  She comes in today with increasing shortness of breath and chest heaviness.  Her troponins are elevated but flat and have been this high in the past.  BNP is elevated as well.  Her chest x-ray shows a large left pleural effusion with interstitial edema.  On  exam she is moving minimal air and has chronic wheezes.  Her white count is elevated as well at 15,000.  I think her symptoms are multifactorial.  She has not had  dialysis yet today.  She is clearly volume overloaded.  She may need a left thoracentesis in addition to improve her respiratory function.  Volume status will be addressed by dialysis.  We will follow along with you.  Runell Gess, M.D., FACP, [facility], [facility] [address] [place], [place]  [address]  [phone] 09/10/2023 12:37 PM

## 2023-09-10 NOTE — Assessment & Plan Note (Addendum)
Patient with volume overload.  Hyperkalemia.   Acute hypoxemic respiratory failure due to acute pulmonary edema with left pleural effusion. She has been on Bipap, only transitory of nasal cannula high flow.  Now she has been transported to HD and blood pressure noted to be low.  I spoke Nephrology Dr Ronalee Belts, will attempt HD with ultrafiltration, but may need vasopressor support in order to do renal replacement therapy, ([facility])  Consulting critical care, patient may need to be transfer to ICU if not able to perform regular dialysis.

## 2023-09-10 NOTE — Progress Notes (Signed)
RT NOTE: RT transported patient on bipap from ED room 5 to HD with no complications. Vitals are stable. RT covering HD notified.

## 2023-09-10 NOTE — Progress Notes (Signed)
Heart Failure Navigator Progress Note  Assessed for Heart & Vascular TOC clinic readiness.  Patient does not meet criteria due to ESRD on hemodialysis.   Navigator will sign off at this time.    Dawn Fields, BSN, RN Heart Failure Nurse Navigator Secure Chat Only   

## 2023-09-10 NOTE — H&P (Addendum)
History and Physical  [name] UXL:[id number] DOB: 06-27-1951 DOA: 09/09/2023  Referring physician: Dr. Donnald Garre, EDP  PCP: Vladimir Crofts, FNP  Outpatient Specialists: Nephrology. Patient coming from: Home  Chief Complaint: Shortness of breath  HPI: [name] [name] is a 72 y.o. female with medical history significant for ESRD on HD TTS, chronic combined diastolic and systolic CHF, COPD on 3 L nasal cannula continuously, OSA on CPAP nightly, hypertension, type 2 diabetes, paroxysmal A-fib on Eliquis, coronary artery disease status post PCI, small cell lung cancer s/p radiation (completed in April 06, 2022 ), who presented to the ED from home via EMS due to progressive shortness of breath today.  Associated with some chest tightness.  Upon EMS arrival, the patient was saturating in the 80s on her home 3 L nasal cannula.  In the ED, on exam with increased work of breathing she was placed on BiPAP.  Chest x-ray shows increased pulmonary vascularity suggestive of pulmonary edema and bilateral pleural effusions.  Last hemodialysis was on Saturday.  EDP discussed the case with nephrology with plan for hemodialysis on 09/10/2023.  Admitted by [facility], hospitalist service.  ED Course: Temperature 97.3.  BP 130/56, pulse 81, respiration rate 24, saturation 97% on BiPAP with FiO2 of 40%  Review of Systems: Review of systems as noted in the HPI. All other systems reviewed and are negative.   Past Medical History:  Diagnosis Date   Anemia    Arthritis    Asthma    Chronic diastolic (congestive) heart failure (HCC)    Chronic kidney disease    Dialysis T-TH-Sat   Chronic pain syndrome    Class 2 obesity due to excess calories with body mass index (BMI) of 36.0 to 36.9 in adult    COPD (chronic obstructive pulmonary disease) (HCC)    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   Full dentures    GERD (gastroesophageal reflux disease)    Gout    History of hiatal  hernia    History of transfusion    Hx MRSA infection    abscess left groin   Hyperlipidemia    Hypertension    Hypothyroid    Intractable nausea and vomiting 02/15/2021   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HCC)    Peripheral vascular disease (HCC)    Sleep apnea    UNABLE TO TOLERATE C PAP   Stress incontinence    TIA (transient ischemic attack)     X2 NO RESIDUAL PROBLEMS   Wears glasses    Past Surgical History:  Procedure Laterality Date   ABDOMINAL HYSTERECTOMY     AV FISTULA PLACEMENT Left 12/01/2021   Procedure: INSERTION OF LEFT ARM ARTERIOVENOUS (AV) GORE-TEX GRAFT;  Surgeon: Nada Libman, MD;  Location: [facility] OR;  Service: Vascular;  Laterality: Left;   BACK SURGERY  2020   BASCILIC VEIN TRANSPOSITION Left 04/05/2020   Procedure: BASILIC VEIN TRANSPOSITION FIRST STAGE;  Surgeon: Chuck Hint, MD;  Location: [facility] OR;  Service: Vascular;  Laterality: Left;   BIOPSY  08/28/2023   Procedure: BIOPSY;  Surgeon: Kerin Salen, MD;  Location: [facility] ENDOSCOPY;  Service: Gastroenterology;;   BOTOX INJECTION  08/28/2023   Procedure: BOTOX INJECTION;  Surgeon: Kerin Salen, MD;  Location: [facility] ENDOSCOPY;  Service: Gastroenterology;;   BRONCHIAL BIOPSY  01/23/2022   Procedure: BRONCHIAL BIOPSIES;  Surgeon: Josephine Igo, DO;  Location: [facility] ENDOSCOPY;  Service: Pulmonary;;   BRONCHIAL BIOPSY  08/07/2022   Procedure: BRONCHIAL  BIOPSIES;  Surgeon: Josephine Igo, DO;  Location: [facility] ENDOSCOPY;  Service: Pulmonary;;   BRONCHIAL BRUSHINGS  08/07/2022   Procedure: BRONCHIAL BRUSHINGS;  Surgeon: Josephine Igo, DO;  Location: [facility] ENDOSCOPY;  Service: Pulmonary;;   BRONCHIAL NEEDLE ASPIRATION BIOPSY  01/23/2022   Procedure: BRONCHIAL NEEDLE ASPIRATION BIOPSIES;  Surgeon: Josephine Igo, DO;  Location: [facility] ENDOSCOPY;  Service: Pulmonary;;   BRONCHIAL NEEDLE ASPIRATION BIOPSY  08/07/2022   Procedure: BRONCHIAL NEEDLE ASPIRATION BIOPSIES;  Surgeon: Josephine Igo, DO;  Location: [facility] ENDOSCOPY;   Service: Pulmonary;;   COLON SURGERY  1995   DUE TO POLYP   DILATION AND CURETTAGE OF UTERUS     ENDOBRONCHIAL ULTRASOUND  01/23/2022   Procedure: ENDOBRONCHIAL ULTRASOUND;  Surgeon: Josephine Igo, DO;  Location: [facility] ENDOSCOPY;  Service: Pulmonary;;   ENTEROSCOPY N/A 07/10/2021   Procedure: ENTEROSCOPY;  Surgeon: Kerin Salen, MD;  Location: [facility] ENDOSCOPY;  Service: Gastroenterology;  Laterality: N/A;   ESOPHAGEAL DILATION  08/28/2023   Procedure: ESOPHAGEAL BALLOON  DILATION;  Surgeon: Kerin Salen, MD;  Location: [facility] ENDOSCOPY;  Service: Gastroenterology;;   ESOPHAGOGASTRODUODENOSCOPY N/A 01/25/2021   Procedure: ESOPHAGOGASTRODUODENOSCOPY (EGD);  Surgeon: Vida Rigger, MD;  Location: [facility] ENDOSCOPY;  Service: Endoscopy;  Laterality: N/A;   ESOPHAGOGASTRODUODENOSCOPY (EGD) WITH PROPOFOL N/A 08/28/2023   Procedure: ESOPHAGOGASTRODUODENOSCOPY (EGD) WITH PROPOFOL;  Surgeon: Kerin Salen, MD;  Location: [facility] ENDOSCOPY;  Service: Gastroenterology;  Laterality: N/A;   FINE NEEDLE ASPIRATION  01/23/2022   Procedure: FINE NEEDLE ASPIRATION (FNA) LINEAR;  Surgeon: Josephine Igo, DO;  Location: [facility] ENDOSCOPY;  Service: Pulmonary;;   FOREARM FRACTURE SURGERY     Left arm   GIVENS CAPSULE STUDY N/A 07/07/2021   Procedure: GIVENS CAPSULE STUDY;  Surgeon: Vida Rigger, MD;  Location: [facility] ENDOSCOPY;  Service: Endoscopy;  Laterality: N/A;   HERNIA REPAIR     w/ mesh   HOT HEMOSTASIS N/A 07/10/2021   Procedure: HOT HEMOSTASIS (ARGON PLASMA COAGULATION/BICAP);  Surgeon: Kerin Salen, MD;  Location: [facility] ENDOSCOPY;  Service: Gastroenterology;  Laterality: N/A;   INCISION AND DRAINAGE ABSCESS Left 02/04/2013   Procedure: INCISION AND DRAINAGE LEFT BUTTOCK ABSCESS; INCISION AND DRAINAGE LEFT BREAST ABSCESS;  Surgeon: Shelly Rubenstein, MD;  Location: [facility] OR;  Service: General;  Laterality: Left;   INCISION AND DRAINAGE ABSCESS N/A 02/12/2013   Procedure: INCISION AND DEBRIDEMENT BUTTOCK WOUND ;  Surgeon: Wilmon Arms. Corliss Skains, MD;   Location: [facility] OR;  Service: General;  Laterality: N/A;   INCISION AND DRAINAGE ABSCESS N/A 02/14/2013   Procedure: INCISION AND DRAINAGE/DRESSING CHANGE;  Surgeon: Shelly Rubenstein, MD;  Location: [facility] OR;  Service: General;  Laterality: N/A;   INCISION AND DRAINAGE ABSCESS N/A 03/21/2015   Procedure: INCISION AND DRAINAGE PUBIC ABSCESS;  Surgeon: Glenna Fellows, MD;  Location: [facility] ORS;  Service: General;  Laterality: N/A;   INCISION AND DRAINAGE PERIRECTAL ABSCESS Left 02/10/2013   Procedure: IRRIGATION AND DEBRIDEMENT OF BUTTOCK/PERINEAL ABSCESS;  Surgeon: Wilmon Arms. Corliss Skains, MD;  Location: [facility] OR;  Service: General;  Laterality: Left;   INCISION AND DRAINAGE PERIRECTAL ABSCESS N/A 02/16/2013   Procedure: IRRIGATION AND DEBRIDEMENT PERINEAL ABSCESS;  Surgeon: Liz Malady, MD;  Location: [facility] OR;  Service: General;  Laterality: N/A;   IR FLUORO GUIDE CV LINE RIGHT  11/30/2021   IR FLUORO GUIDE CV LINE RIGHT  08/22/2023   IR REMOVAL TUN CV CATH W/O FL  08/29/2023   IR US GUIDE VASC ACCESS RIGHT  11/30/2021   IR US GUIDE VASC ACCESS RIGHT  08/22/2023   IRRIGATION AND DEBRIDEMENT ABSCESS N/A 02/06/2013   Procedure: IRRIGATION AND DEBRIDEMENT BUTTOCK ABSCESS AND DRESSING CHANGE;  Surgeon: Shelly Rubenstein, MD;  Location: [facility] OR;  Service: General;  Laterality: N/A;   IRRIGATION AND DEBRIDEMENT ABSCESS Left 02/08/2013   Procedure: IRRIGATION AND DEBRIDEMENT ABSCESS/DRESSING CHANGE;  Surgeon: Cherylynn Ridges, MD;  Location: [facility] OR;  Service: General;  Laterality: Left;   JOINT REPLACEMENT  2010 / 2012    LAPAROSCOPIC CHOLECYSTECTOMY     LEFT HEART CATH AND CORONARY ANGIOGRAPHY N/A 07/05/2023   Procedure: LEFT HEART CATH AND CORONARY ANGIOGRAPHY;  Surgeon: Lennette Bihari, MD;  Location: [facility] INVASIVE CV LAB;  Service: Cardiovascular;  Laterality: N/A;   LEFT HEART CATHETERIZATION WITH CORONARY ANGIOGRAM N/A 07/02/2014   Procedure: LEFT HEART CATHETERIZATION WITH CORONARY ANGIOGRAM;  Surgeon: Runell Gess, MD;   Location: [facility] CATH LAB;  Service: Cardiovascular;  Laterality: N/A;   LOWER EXTREMITY ANGIOGRAM Right 04/20/2016   Procedure: Lower Extremity Angiogram;  Surgeon: Sherren Kerns, MD;  Location: [facility] INVASIVE CV LAB;  Service: Cardiovascular;  Laterality: Right;   MULTIPLE TOOTH EXTRACTIONS     PERIPHERAL VASCULAR CATHETERIZATION N/A 04/20/2016   Procedure: Abdominal Aortogram;  Surgeon: Sherren Kerns, MD;  Location: [facility] INVASIVE CV LAB;  Service: Cardiovascular;  Laterality: N/A;   PERIPHERAL VASCULAR CATHETERIZATION Right 04/20/2016   Procedure: Peripheral Vascular Intervention;  Surgeon: Sherren Kerns, MD;  Location: [facility] INVASIVE CV LAB;  Service: Cardiovascular;  Laterality: Right;  popiteal   RIGHT HEART CATH N/A 10/27/2018   Procedure: RIGHT HEART CATH;  Surgeon: Laurey Morale, MD;  Location: [facility] INVASIVE CV LAB;  Service: Cardiovascular;  Laterality: N/A;   RIGHT HEART CATH N/A 03/20/2019   Procedure: RIGHT HEART CATH;  Surgeon: Laurey Morale, MD;  Location: [facility] INVASIVE CV LAB;  Service: Cardiovascular;  Laterality: N/A;   THYROIDECTOMY     TOTAL HIP REVISION Right 08/11/2014   Procedure: RIGHT ACETABULAR REVISION;  Surgeon: Loanne Drilling, MD;  Location: [facility] ORS;  Service: Orthopedics;  Laterality: Right;   VASCULAR SURGERY     VIDEO BRONCHOSCOPY WITH ENDOBRONCHIAL ULTRASOUND Bilateral 08/07/2022   Procedure: VIDEO BRONCHOSCOPY WITH ENDOBRONCHIAL ULTRASOUND;  Surgeon: Josephine Igo, DO;  Location: [facility] ENDOSCOPY;  Service: Pulmonary;  Laterality: Bilateral;   VIDEO BRONCHOSCOPY WITH RADIAL ENDOBRONCHIAL ULTRASOUND  01/23/2022   Procedure: RADIAL ENDOBRONCHIAL ULTRASOUND;  Surgeon: Josephine Igo, DO;  Location: [facility] ENDOSCOPY;  Service: Pulmonary;;    Social History:  reports that she quit smoking about 23 months ago. Her smoking use included cigarettes. She started smoking about 41 years ago. She has a 10 pack-year smoking history. She has been exposed to tobacco smoke. She has quit  using smokeless tobacco. She reports current alcohol use of about 1.0 standard drink of alcohol per week. She reports that she does not use drugs.   Allergies  Allergen Reactions   Nebivolol Swelling    Chest pain (reaction to Bystolic)   Zestril [Lisinopril] Swelling and Other (See Comments)   Ace Inhibitors Swelling and Other (See Comments)    Tongue swell   Morphine And Codeine Itching    Family History  Problem Relation Age of Onset   Diabetes Brother    Cardiomyopathy Mother    Heart disease Mother    Cancer Father       Prior to Admission medications   Medication Sig Start Date End Date Taking? Authorizing Provider  acetaminophen (TYLENOL) 500 MG tablet Take 1 tablet (  500 mg total) by mouth See admin instructions. Take 1,000mg  (2 tablets) by mouth twice a day in addition to 1,000mg  (2 tablets) twice daily as needed for pain. Patient taking differently: Take 1,000 mg by mouth in the morning and at bedtime. 05/14/23  Yes Ghimire, Werner Lean, MD  albuterol (PROVENTIL) (2.5 MG/3ML) 0.083% nebulizer solution Take 3 mLs (2.5 mg total) by nebulization every 6 (six) hours as needed for wheezing or shortness of breath. Patient taking differently: Take 2.5 mg by nebulization 3 (three) times daily. 05/14/23  Yes Ghimire, Werner Lean, MD  albuterol (VENTOLIN HFA) 108 (90 Base) MCG/ACT inhaler Inhale 2 puffs into the lungs every 6 (six) hours as needed for shortness of breath (COPD). 05/14/23  Yes Ghimire, Werner Lean, MD  allopurinol (ZYLOPRIM) 100 MG tablet Take 1 tablet (100 mg total) by mouth daily. 09/11/23  Yes Leroy Sea, MD  budesonide-formoterol [facility]) 160-4.5 MCG/ACT inhaler Inhale 2 puffs into the lungs in the morning and at bedtime. 05/14/23  Yes Ghimire, Werner Lean, MD  clopidogrel (PLAVIX) 75 MG tablet Take 75 mg by mouth daily.   Yes [provider]  colchicine 0.6 MG tablet Take 0.5 tablets (0.3 mg total) by mouth daily. 08/29/23  Yes Leroy Sea, MD  diclofenac  Sodium (VOLTAREN) 1 % GEL Apply 2 g topically 4 (four) times daily. Patient taking differently: Apply 1 Application topically daily. 04/17/23  Yes Marlin Canary U, DO  docusate sodium (COLACE) 100 MG capsule Take 1 capsule (100 mg total) by mouth daily. 05/14/23  Yes Ghimire, Werner Lean, MD  fluticasone (FLONASE) 50 MCG/ACT nasal spray Place 1 spray into both nostrils daily. Patient taking differently: Place 2 sprays into both nostrils daily. 05/14/23  Yes Ghimire, Werner Lean, MD  insulin lispro (HUMALOG KWIKPEN) 100 UNIT/ML KwikPen 0-15 Units, Subcutaneous, 3 times daily with meal CBG < 70: Implement Hypoglycemia meausers CBG 70 - 120: 0 units CBG 121 - 150: 2 units CBG 151 - 200: 3 units CBG 201 - 250: 5 units CBG 251 - 300: 8 units CBG 301 - 350: 11 units CBG 351 - 400: 15 units CBG > 400: call MD Patient taking differently: Inject 0-15 Units into the skin daily. 05/14/23  Yes Ghimire, Werner Lean, MD  levothyroxine (SYNTHROID) 175 MCG tablet Take 1 tablet (175 mcg total) by mouth daily. 05/14/23  Yes Ghimire, Werner Lean, MD  lidocaine-prilocaine (EMLA) cream Apply 1 Application topically See admin instructions. On dialysis days (Tues,Thurs,Sat) 02/13/22  Yes [provider]  meclizine (ANTIVERT) 25 MG tablet Take 25 mg by mouth as needed for dizziness or nausea. 06/12/23  Yes [provider]  Multiple Vitamins-Iron (MULTIVITAMINS WITH IRON) TABS tablet Take 1 tablet by mouth in the morning. 05/14/23  Yes Ghimire, Werner Lean, MD  OXYGEN Inhale 3 L into the lungs.   Yes [provider]  sevelamer carbonate (RENVELA) 800 MG tablet Take 2 tablets (1,600 mg total) by mouth 3 (three) times daily with meals. 05/14/23  Yes Ghimire, Werner Lean, MD  thiamine (VITAMIN B1) 100 MG tablet Take 1 tablet (100 mg total) by mouth in the morning. 05/14/23  Yes Ghimire, Werner Lean, MD  apixaban (ELIQUIS) 5 MG TABS tablet Take 1 tablet (5 mg total) by mouth 2 (two) times daily. 07/20/23   Ghimire, Werner Lean, MD   metoprolol tartrate (LOPRESSOR) 25 MG tablet Take 1.5 tablets (37.5 mg total) by mouth 2 (two) times daily. Patient not taking: Reported on 09/10/2023 07/20/23   Jeoffrey Massed  M, MD  midodrine (PROAMATINE) 5 MG tablet Take 3 tablets (15 mg total) by mouth every 8 (eight) hours. Patient not taking: Reported on 09/10/2023 07/20/23   Maretta Bees, MD  nitroGLYCERIN (NITROSTAT) 0.4 MG SL tablet Place 1 tablet (0.4 mg total) under the tongue every 5 (five) minutes as needed for chest pain. Patient not taking: Reported on 09/10/2023 07/06/23 07/05/24  Cyndi Bender, NP  rosuvastatin (CRESTOR) 20 MG tablet Take 1 tablet (20 mg total) by mouth daily. Patient not taking: Reported on 09/10/2023 07/06/23   Cyndi Bender, NP    Physical Exam: BP (!) 122/45   Pulse 70   Temp (!) 97.3 F (36.3 C) (Oral)   Resp (!) 24   SpO2 100%   General: 72 y.o. year-old female well developed well nourished in no acute distress.  Alert and oriented x3. Cardiovascular: Regular rate and rhythm with no rubs or gallops.  No thyromegaly or JVD noted.  Trace lower extremity edema bilaterally. Respiratory: Diffuse rales bilaterally.  Poor inspiratory effort. Abdomen: Soft nontender nondistended with normal bowel sounds x4 quadrants. Muskuloskeletal: No cyanosis or clubbing noted bilaterally Neuro: CN II-XII intact, strength, sensation, reflexes Skin: Right forearm chronic wound, per the patient present since October 2024. Psychiatry: Judgement and insight appear normal. Mood is appropriate for condition and setting          Labs on Admission:  Basic Metabolic Panel: Recent Labs  Lab 09/09/23 2149 09/09/23 2152  NA 142 138  K 5.7* 5.6*  CL 98  --   CO2 25  --   GLUCOSE 198*  --   BUN 105*  --   CREATININE 8.09*  --   CALCIUM 10.1  --    Liver Function Tests: Recent Labs  Lab 09/09/23 2149  AST 27  ALT 17  ALKPHOS 102  BILITOT 0.6  PROT 6.7  ALBUMIN 3.3*   No results for input(s): "LIPASE", "AMYLASE"  in the last 168 hours. No results for input(s): "AMMONIA" in the last 168 hours. CBC: Recent Labs  Lab 09/09/23 2149 09/09/23 2152  WBC 16.7*  --   NEUTROABS 14.3*  --   HGB 9.2* 10.9*  HCT 31.1* 32.0*  MCV 105.4*  --   PLT 239  --    Cardiac Enzymes: No results for input(s): "CKTOTAL", "CKMB", "CKMBINDEX", "TROPONINI" in the last 168 hours.  BNP (last 3 results) Recent Labs    07/11/23 1321 08/20/23 0456 09/09/23 2149  BNP 903.7* 1,345.3* 1,611.2*    ProBNP (last 3 results) No results for input(s): "PROBNP" in the last 8760 hours.  CBG: No results for input(s): "GLUCAP" in the last 168 hours.  Radiological Exams on Admission: DG Chest Port 1 View  Result Date: 09/09/2023 CLINICAL DATA:  Shortness of breath. EXAM: PORTABLE CHEST 1 VIEW COMPARISON:  August 24, 2023 FINDINGS: The right-sided venous catheter seen on the prior study has been removed. There is stable moderate severity cardiac silhouette enlargement. Mild diffusely increased interstitial lung markings are seen with mild to moderate severity prominence of the central pulmonary vasculature. Moderate to marked severity left basilar atelectasis and/or infiltrate is noted. There is a small to moderate size left pleural effusion. No pneumothorax is identified. Multilevel degenerative changes seen throughout the thoracic spine. IMPRESSION: 1. Cardiomegaly with mild to moderate severity pulmonary vascular congestion. 2. Moderate to marked severity left basilar atelectasis and/or infiltrate. 3. Small to moderate size left pleural effusion. Electronically Signed   By: Aram Candela M.D.   On: 09/09/2023  23:56    EKG: I independently viewed the EKG done and my findings are as followed: Sinus rhythm rate of 92.  LBBB, nonspecific ST-T changes.  QTc 487  Assessment/Plan Present on Admission:  Acute on chronic combined systolic (congestive) and diastolic (congestive) heart failure (HCC)  Principal Problem:   Acute on  chronic combined systolic (congestive) and diastolic (congestive) heart failure (HCC)  Acute on chronic combined systolic and diastolic CHF Presented with BNP greater than 1600 Pulm edema and bilateral pleural effusions seen on chest x-ray Last 2D echo done on 04/15/2023 revealed LVEF 35 to 40% with grade 1 diastolic dysfunction. Volume status managed with hemodialysis Last hemodialysis was on Saturday 09/07/23 Plan for hemodialysis on 09/10/2023 Obtain 2D echo limited  Acute on chronic hypoxic respiratory failure in the setting of pulmonary edema and bilateral pleural effusion At baseline on 3 L nasal cannula continuously Currently on BiPAP, wean off BiPAP as tolerated.  Elevated troponin, suspect demand ischemia in the setting of acute on chronic hypoxia Troponin 178 from 161 Trend troponin until it reaches its peak Follow 2D echo and repeat twelve-lead EKG Cardiology consulted to assist with the management  Left forearm chronic wound, POA Incidentally noted, the patient stated it has been there since October 2024, she does her own dressing changes every 3 days Wound care specialist consulted Continue local wound care  Paroxysmal A-fib on Eliquis Resume home Eliquis  Hypothyroidism Resume home levothyroxine  COPD Resume home bronchodilators  ESRD on HD TTS Resume hemodialysis sessions   Critical care time: 75 minutes.   DVT prophylaxis: Home Eliquis  Code Status: DNR  Family Communication: None at bedside.  Disposition Plan: Admitted to progressive care unit  Consults called: Nephrology consulted by EDP, cardiology.  Admission status: Inpatient status.   Status is: Inpatient The patient requires at least 2 midnights for further evaluation and treatment of present condition.   Darlin Drop MD [facility] Pager [phone]  If 7PM-7AM, please contact night-coverage [email or web address] Password [email or web address]  09/10/2023, 2:57 AM

## 2023-09-10 NOTE — Consult Note (Signed)
NAME:  [name] [name], MRN:  [id number], DOB:  [birth year], LOS: 0 ADMISSION DATE:  09/09/2023, CONSULTATION DATE:  12/3 REFERRING MD:  Ella Jubilee, CHIEF COMPLAINT:  hypotension and acute on chronic resp failure    History of Present Illness:   72 year old female patient with complex medical history including chronic respiratory failure in the setting of COPD, requiring BiPAP at at bedtime, OSA, systolic and diastolic heart failure (grade 1 diastolic heart failure, EF 35 to 40%) , atrial fibs on Eliquis, nonobstructive coronary artery disease, remote non-small cell cancer status post radiation.  Presented to the emergency room from home on 12/2.  Her last dialysis treatment was on 12/1.  Reports had been doing well over the holiday however did begin to notice increasing shortness of breath on day of hospital admission.  Of note she had cut her dialysis treatment short.  Telling providers that when she extends therapy longer than 4 hours she develops nausea, and worsening fatigue.  Over the course of 12/2 developed progressive increasing shortness of breath with worsening work of breathing pulse oximeter on 3 L noted to be 70%.  Initial evaluation her BNP was 1611,BUN 105 up from 56, creatinine 8.09 up from 6.87 baseline, she had elevated troponin at 153, cardiology was consulted, and felt troponin elevation was likely due to decompensated heart failure A chest x-ray was obtained This showed cardiomegaly, pulmonary edema, with what appears to be increased left pleural effusion  Hospital course Placed on noninvasive positive pressure ventilation. Nephrology consulted, as well as cardiology.  All in the emergency room.  Seen by [facility] nurse for right chronic forearm wound Started on intermittent dialysis on 12/3 While getting started systolic blood pressure as low as 70s, this responded to decreasing dialysis rate, and albumin bolus x 2.  Because of her hypotension pulmonary was asked to see, in addition noted  to have worsening acute on chronic respiratory failure and what appeared to be left pleural effusion for which we have been asked to evaluate Pertinent  Medical History  Chronic respiratory failure, end-stage renal disease on hemodialysis TTS, nonobstructive coronary artery disease chronic combined heart failure, atrial fibrillation, mild aortic stenosis, hypertension, obstructive sleep apnea, type 2 diabetes, peripheral vascular disease, prior TIA, history of COPD, history of non-small cell lung cancer status post radiation and felt to be in remission, obesity.  Significant Hospital Events: Including procedures, antibiotic start and stop dates in addition to other pertinent events   12/2 admitted with acute decompensated heart failure.  Suspect additional sodium load during holidays.  Also cutting dialysis short not helping 12/3 started dialysis.  Had been on BiPAP all night, had not received midodrine.  Breathing is better.  Got hypotensive when initiating UF, required albumin  Interim History / Subjective:  Feels better w/ BIPAP and current support. Want BIPAP off   Objective   Blood pressure (!) 99/52, pulse 88, temperature 97.9 F (36.6 C), resp. rate (!) 24, height 5\' 3"  (1.6 m), weight 96.4 kg, SpO2 100%.    Vent Mode: PSV FiO2 (%):  [40 %-50 %] 50 %  No intake or output data in the 24 hours ending 09/10/23 1621 Filed Weights   09/10/23 1136  Weight: 96.4 kg    Examination: General: Chronically ill-appearing 71 year old female lying in bed currently no acute distress HENT: Normocephalic atraumatic no clear JVD BiPAP mask in place does have fairly significant facial adiposity neck is large Lungs: Decreased throughout, currently on NIPPV.  No significant wheezing or rhonchi Cardiovascular: Regular  irregular atrial fibrillation Abdomen: Soft not tender Extremities: Warm, dependent edema, pulses palpable.  Is a right chronic forearm wound.  Full-thickness, seen by wound ostomy.   Currently dressed with dry gauze and silicone foam Neuro: Awake oriented no focal deficits GU: Anuric  Resolved Hospital Problem list     Assessment & Plan:  Acute on chronic hypotension -Does have chronic hypotension in the context of her end-stage renal disease.  Has not received midodrine for several doses now -This seems to be exacerbated by initiation of ultrafiltration Plan Goal systolic blood pressure greater than 90 Start midodrine, certainly could utilize low-dose norepinephrine if needed to facilitate volume removal Continue for goal net negative UF if able Last cortisol checked back in 2022, reasonable to check again will order Check TSH Need to remove metoprolol from her MAR, she is no longer taking this  Acute on chronic respiratory failure secondary to pulmonary edema, volume overload, and worsening left pleural effusion, superimposed on chronic obstructive pulmonary disease -Little better now, desaturates on room air but should be able to tolerate nasal cannula Plan Will change oxygen to be delivered via nasal cannula for saturations greater than 90% Change BiPAP to at bedtime and as needed Will change her LABA and ICS to nebulized route, add Yupelri I do not see need for pulse steroids from a pulmonary standpoint Hold Eliquis, repeat a.m. chest x-ray, may need to consider left thoracocentesis Check procalcitonin Trend BNP  Acute on chronic combined heart failure, with history of nonobstructive coronary artery disease.  Known systolic heart failure EF 35 to 40%, with grade 1 diastolic dysfunction.  Had mild elevated troponin but currently this is flat.  Not a candidate for gold directed medical therapy from a heart failure standpoint -Seen by cardiology Plan Continue telemetry monitoring Volume removal as tolerated Refuses statin Cont plavix Does not tolerate CCB or nitrates   History of chronic atrial fibrillation.  Currently with controlled ventricular rate No  longer taking metoprolol Plan Continue telemetry monitoring Rate control as needed Hold Eliquis effective tonight 12/3 and anticipate addition of possible need for thoracentesis  End-stage renal disease on IHD TTS -Poor tolerance for extended treatment nearing the 4-hour mark.  This resulted in extensive fatigue and nausea.  Patient frequently requesting shortened HD runs Plan Currently on IHD Volume removal as able Follow-up chemistry Renal diet Continue phosphate binders  History of hypothyroidism Plan Continue levothyroxine Check TSH  History of type 2 diabetes, now insulin-dependent Plan Sliding scale insulin, goal 140-180  Anemia of chronic kidney disease Plan Trend CBC  Class II obesity Plan Renal diet  Chronic full-thickness right forearm wound Seen by [facility] nurse Plan See recommendation note from 12/3, cleanse right forearm with Vashe wound cleanser, apply quarter inch thick layer of Santyl to wound bed then topped with small piece of saline moist gauze, dry gauze followed by silicone foam Okay to lift foam daily for gauze change and reapplication of Santyl, change silicone every 3 days  Best Practice (right click and "Reselect all SmartList Selections" daily)   Diet/type: NPO w/ oral meds DVT prophylaxis:  apixaban (ELIQUIS) tablet 5 mg  placed on hold 12/3 for possible left thora  Pressure ulcer(s): identified on 02/Dec/was present on prior admit.  GI prophylaxis: N/A Lines: N/A Foley:  N/A Code Status:  DNR Last date of multidisciplinary goals of care discussion [per primary ]  Labs   CBC: Recent Labs  Lab 09/09/23 2149 09/09/23 2152 09/10/23 0534 09/10/23 0540  WBC 16.7*  --  15.0*  --   NEUTROABS 14.3*  --   --   --   HGB 9.2* 10.9* 9.1* 10.5*  HCT 31.1* 32.0* 32.2* 31.0*  MCV 105.4*  --  107.7*  --   PLT 239  --  226  --     Basic Metabolic Panel: Recent Labs  Lab 09/09/23 2149 09/09/23 2152 09/10/23 0534 09/10/23 0540  NA 142 138  140 137  K 5.7* 5.6* 6.0* 5.8*  CL 98  --  98  --   CO2 25  --  22  --   GLUCOSE 198*  --  205*  --   BUN 105*  --  106*  --   CREATININE 8.09*  --  8.31*  --   CALCIUM 10.1  --  10.0  --   PHOS  --   --  5.7*  --    GFR: Estimated Creatinine Clearance: 6.8 mL/min (A) (by C-G formula based on SCr of 8.31 mg/dL (H)). Recent Labs  Lab 09/09/23 2149 09/10/23 0534  WBC 16.7* 15.0*    Liver Function Tests: Recent Labs  Lab 09/09/23 2149 09/10/23 0534  AST 27  --   ALT 17  --   ALKPHOS 102  --   BILITOT 0.6  --   PROT 6.7  --   ALBUMIN 3.3* 3.3*   No results for input(s): "LIPASE", "AMYLASE" in the last 168 hours. No results for input(s): "AMMONIA" in the last 168 hours.  ABG    Component Value Date/Time   PHART 7.13 (LL) 08/22/2023 1742   PCO2ART 92 (HH) 08/22/2023 1742   PO2ART 75 (L) 08/22/2023 1742   HCO3 28.5 (H) 09/10/2023 0540   TCO2 30 09/10/2023 0540   ACIDBASEDEF 1.2 08/22/2023 1742   O2SAT 58 09/10/2023 0540     Coagulation Profile: No results for input(s): "INR", "PROTIME" in the last 168 hours.  Cardiac Enzymes: No results for input(s): "CKTOTAL", "CKMB", "CKMBINDEX", "TROPONINI" in the last 168 hours.  HbA1C: Hgb A1c MFr Bld  Date/Time Value Ref Range Status  04/13/2023 06:58 PM 7.1 (H) 4.8 - 5.6 % Final    Comment:    (NOTE) Pre diabetes:          5.7%-6.4%  Diabetes:              >6.4%  Glycemic control for   <7.0% adults with diabetes   08/08/2022 01:30 AM 7.0 (H) 4.8 - 5.6 % Final    Comment:    (NOTE) Pre diabetes:          5.7%-6.4%  Diabetes:              >6.4%  Glycemic control for   <7.0% adults with diabetes     CBG: No results for input(s): "GLUCAP" in the last 168 hours.  Review of Systems:   Review of Systems  Constitutional:  Positive for malaise/fatigue. Negative for fever and weight loss.  HENT: Negative.    Eyes: Negative.   Respiratory:  Positive for shortness of breath and wheezing.   Cardiovascular:   Positive for chest pain and leg swelling.  Gastrointestinal:  Positive for nausea.  Genitourinary: Negative.   Musculoskeletal: Negative.   Skin: Negative.   Neurological: Negative.   Endo/Heme/Allergies: Negative.   Psychiatric/Behavioral: Negative.       Past Medical History:  She,  has a past medical history of Anemia, Arthritis, Asthma, Chronic diastolic (congestive) heart failure (HCC), Chronic kidney disease, Chronic pain syndrome, Class 2 obesity due  to excess calories with body mass index (BMI) of 36.0 to 36.9 in adult, COPD (chronic obstructive pulmonary disease) (HCC), Diabetes mellitus without complication (HCC), Full dentures, GERD (gastroesophageal reflux disease), Gout, History of hiatal hernia, History of transfusion, MRSA infection, Hyperlipidemia, Hypertension, Hypothyroid, Intractable nausea and vomiting (02/15/2021), 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HCC), Peripheral vascular disease (HCC), Sleep apnea, Stress incontinence, TIA (transient ischemic attack), and Wears glasses.   Surgical History:   Past Surgical History:  Procedure Laterality Date   ABDOMINAL HYSTERECTOMY     AV FISTULA PLACEMENT Left 12/01/2021   Procedure: INSERTION OF LEFT ARM ARTERIOVENOUS (AV) GORE-TEX GRAFT;  Surgeon: Nada Libman, MD;  Location: [facility] OR;  Service: Vascular;  Laterality: Left;   BACK SURGERY  2020   BASCILIC VEIN TRANSPOSITION Left 04/05/2020   Procedure: BASILIC VEIN TRANSPOSITION FIRST STAGE;  Surgeon: Chuck Hint, MD;  Location[facility] OR;  Service: Vascular;  Laterality: Left;   BIOPSY  08/28/2023   Procedure: BIOPSY;  Surgeon: Kerin Salen, MD;  Location: [facility] ENDOSCOPY;  Service: Gastroenterology;;   BOTOX INJECTION  08/28/2023   Procedure: BOTOX INJECTION;  Surgeon: Kerin Salen, MD;  Location: [facility] ENDOSCOPY;  Service: Gastroenterology;;   BRONCHIAL BIOPSY  01/23/2022   Procedure: BRONCHIAL BIOPSIES;  Surgeon: Josephine Igo, DO;  Location: [facility] ENDOSCOPY;  Service: Pulmonary;;    BRONCHIAL BIOPSY  08/07/2022   Procedure: BRONCHIAL BIOPSIES;  Surgeon: Josephine Igo, DO;  Location: [facility] ENDOSCOPY;  Service: Pulmonary;;   BRONCHIAL BRUSHINGS  08/07/2022   Procedure: BRONCHIAL BRUSHINGS;  Surgeon: Josephine Igo, DO;  Location: [facility] ENDOSCOPY;  Service: Pulmonary;;   BRONCHIAL NEEDLE ASPIRATION BIOPSY  01/23/2022   Procedure: BRONCHIAL NEEDLE ASPIRATION BIOPSIES;  Surgeon: Josephine Igo, DO;  Location: [facility] ENDOSCOPY;  Service: Pulmonary;;   BRONCHIAL NEEDLE ASPIRATION BIOPSY  08/07/2022   Procedure: BRONCHIAL NEEDLE ASPIRATION BIOPSIES;  Surgeon: Josephine Igo, DO;  Location: [facility] ENDOSCOPY;  Service: Pulmonary;;   COLON SURGERY  1995   DUE TO POLYP   DILATION AND CURETTAGE OF UTERUS     ENDOBRONCHIAL ULTRASOUND  01/23/2022   Procedure: ENDOBRONCHIAL ULTRASOUND;  Surgeon: Josephine Igo, DO;  Location: [facility] ENDOSCOPY;  Service: Pulmonary;;   ENTEROSCOPY N/A 07/10/2021   Procedure: ENTEROSCOPY;  Surgeon: Kerin Salen, MD;  Location: [facility] ENDOSCOPY;  Service: Gastroenterology;  Laterality: N/A;   ESOPHAGEAL DILATION  08/28/2023   Procedure: ESOPHAGEAL BALLOON  DILATION;  Surgeon: Kerin Salen, MD;  Location: [facility] ENDOSCOPY;  Service: Gastroenterology;;   ESOPHAGOGASTRODUODENOSCOPY N/A 01/25/2021   Procedure: ESOPHAGOGASTRODUODENOSCOPY (EGD);  Surgeon: Vida Rigger, MD;  Location: [facility] ENDOSCOPY;  Service: Endoscopy;  Laterality: N/A;   ESOPHAGOGASTRODUODENOSCOPY (EGD) WITH PROPOFOL N/A 08/28/2023   Procedure: ESOPHAGOGASTRODUODENOSCOPY (EGD) WITH PROPOFOL;  Surgeon: Kerin Salen, MD;  Location: [facility] ENDOSCOPY;  Service: Gastroenterology;  Laterality: N/A;   FINE NEEDLE ASPIRATION  01/23/2022   Procedure: FINE NEEDLE ASPIRATION (FNA) LINEAR;  Surgeon: Josephine Igo, DO;  Location: [facility] ENDOSCOPY;  Service: Pulmonary;;   FOREARM FRACTURE SURGERY     Left arm   GIVENS CAPSULE STUDY N/A 07/07/2021   Procedure: GIVENS CAPSULE STUDY;  Surgeon: Vida Rigger, MD;  Location: [facility] ENDOSCOPY;   Service: Endoscopy;  Laterality: N/A;   HERNIA REPAIR     w/ mesh   HOT HEMOSTASIS N/A 07/10/2021   Procedure: HOT HEMOSTASIS (ARGON PLASMA COAGULATION/BICAP);  Surgeon: Kerin Salen, MD;  Location[facility] ENDOSCOPY;  Service: Gastroenterology;  Laterality: N/A;   INCISION AND DRAINAGE ABSCESS Left 02/04/2013   Procedure: INCISION AND  DRAINAGE LEFT BUTTOCK ABSCESS; INCISION AND DRAINAGE LEFT BREAST ABSCESS;  Surgeon: Shelly Rubenstein, MD;  Location: [facility] OR;  Service: General;  Laterality: Left;   INCISION AND DRAINAGE ABSCESS N/A 02/12/2013   Procedure: INCISION AND DEBRIDEMENT BUTTOCK WOUND ;  Surgeon: Wilmon Arms. Corliss Skains, MD;  Location: [facility] OR;  Service: General;  Laterality: N/A;   INCISION AND DRAINAGE ABSCESS N/A 02/14/2013   Procedure: INCISION AND DRAINAGE/DRESSING CHANGE;  Surgeon: Shelly Rubenstein, MD;  Location: [facility] OR;  Service: General;  Laterality: N/A;   INCISION AND DRAINAGE ABSCESS N/A 03/21/2015   Procedure: INCISION AND DRAINAGE PUBIC ABSCESS;  Surgeon: Glenna Fellows, MD;  Location: [facility] ORS;  Service: General;  Laterality: N/A;   INCISION AND DRAINAGE PERIRECTAL ABSCESS Left 02/10/2013   Procedure: IRRIGATION AND DEBRIDEMENT OF BUTTOCK/PERINEAL ABSCESS;  Surgeon: Wilmon Arms. Corliss Skains, MD;  Location: [facility] OR;  Service: General;  Laterality: Left;   INCISION AND DRAINAGE PERIRECTAL ABSCESS N/A 02/16/2013   Procedure: IRRIGATION AND DEBRIDEMENT PERINEAL ABSCESS;  Surgeon: Liz Malady, MD;  Location: [facility] OR;  Service: General;  Laterality: N/A;   IR FLUORO GUIDE CV LINE RIGHT  11/30/2021   IR FLUORO GUIDE CV LINE RIGHT  08/22/2023   IR REMOVAL TUN CV CATH W/O FL  08/29/2023   IR US GUIDE VASC ACCESS RIGHT  11/30/2021   IR US GUIDE VASC ACCESS RIGHT  08/22/2023   IRRIGATION AND DEBRIDEMENT ABSCESS N/A 02/06/2013   Procedure: IRRIGATION AND DEBRIDEMENT BUTTOCK ABSCESS AND DRESSING CHANGE;  Surgeon: Shelly Rubenstein, MD;  Location[facility];  Service: General;  Laterality: N/A;   IRRIGATION AND  DEBRIDEMENT ABSCESS Left 02/08/2013   Procedure: IRRIGATION AND DEBRIDEMENT ABSCESS/DRESSING CHANGE;  Surgeon: Cherylynn Ridges, MD;  Location: [facility] OR;  Service: General;  Laterality: Left;   JOINT REPLACEMENT  2010 / 2012    LAPAROSCOPIC CHOLECYSTECTOMY     LEFT HEART CATH AND CORONARY ANGIOGRAPHY N/A 07/05/2023   Procedure: LEFT HEART CATH AND CORONARY ANGIOGRAPHY;  Surgeon: Lennette Bihari, MD;  [facility]: [facility] INVASIVE CV LAB;  Service: Cardiovascular;  Laterality: N/A;   LEFT HEART CATHETERIZATION WITH CORONARY ANGIOGRAM N/A 07/02/2014   Procedure: LEFT HEART CATHETERIZATION WITH CORONARY ANGIOGRAM;  Surgeon: Runell Gess, MD;  Location: [facility] CATH LAB;  Service: Cardiovascular;  Laterality: N/A;   LOWER EXTREMITY ANGIOGRAM Right 04/20/2016   Procedure: Lower Extremity Angiogram;  Surgeon: Sherren Kerns, MD;  Location: [facility] INVASIVE CV LAB;  Service: Cardiovascular;  Laterality: Right;   MULTIPLE TOOTH EXTRACTIONS     PERIPHERAL VASCULAR CATHETERIZATION N/A 04/20/2016   Procedure: Abdominal Aortogram;  Surgeon: Sherren Kerns, MD;  Location: [facility] INVASIVE CV LAB;  Service: Cardiovascular;  Laterality: N/A;   PERIPHERAL VASCULAR CATHETERIZATION Right 04/20/2016   Procedure: Peripheral Vascular Intervention;  Surgeon: Sherren Kerns, MD;  Location: [facility] INVASIVE CV LAB;  Service: Cardiovascular;  Laterality: Right;  popiteal   RIGHT HEART CATH N/A 10/27/2018   Procedure: RIGHT HEART CATH;  Surgeon: Laurey Morale, MD;  Location: [facility] INVASIVE CV LAB;  Service: Cardiovascular;  Laterality: N/A;   RIGHT HEART CATH N/A 03/20/2019   Procedure: RIGHT HEART CATH;  Surgeon: Laurey Morale, MD;  Location: [facility] INVASIVE CV LAB;  Service: Cardiovascular;  Laterality: N/A;   THYROIDECTOMY     TOTAL HIP REVISION Right 08/11/2014   Procedure: RIGHT ACETABULAR REVISION;  Surgeon: Loanne Drilling, MD;  Location: [facility] ORS;  Service: Orthopedics;  Laterality: Right;   VASCULAR SURGERY  VIDEO BRONCHOSCOPY WITH  ENDOBRONCHIAL ULTRASOUND Bilateral 08/07/2022   Procedure: VIDEO BRONCHOSCOPY WITH ENDOBRONCHIAL ULTRASOUND;  Surgeon: Josephine Igo, DO;  Location: [facility] ENDOSCOPY;  Service: Pulmonary;  Laterality: Bilateral;   VIDEO BRONCHOSCOPY WITH RADIAL ENDOBRONCHIAL ULTRASOUND  01/23/2022   Procedure: RADIAL ENDOBRONCHIAL ULTRASOUND;  Surgeon: Josephine Igo, DO;  Location: [facility] ENDOSCOPY;  Service: Pulmonary;;     Social History:   reports that she quit smoking about 23 months ago. Her smoking use included cigarettes. She started smoking about 41 years ago. She has a 10 pack-year smoking history. She has been exposed to tobacco smoke. She has quit using smokeless tobacco. She reports current alcohol use of about 1.0 standard drink of alcohol per week. She reports that she does not use drugs.   Family History:  Her family history includes Cancer in her father; Cardiomyopathy in her mother; Diabetes in her brother; Heart disease in her mother.   Allergies Allergies  Allergen Reactions   Nebivolol Swelling    Chest pain (reaction to Bystolic)   Zestril [Lisinopril] Swelling and Other (See Comments)   Ace Inhibitors Swelling and Other (See Comments)    Tongue swell   Morphine And Codeine Itching     Home Medications  Prior to Admission medications   Medication Sig Start Date End Date Taking? Authorizing Provider  acetaminophen (TYLENOL) 500 MG tablet Take 1 tablet (500 mg total) by mouth See admin instructions. Take 1,000mg  (2 tablets) by mouth twice a day in addition to 1,000mg  (2 tablets) twice daily as needed for pain. Patient taking differently: Take 1,000 mg by mouth in the morning and at bedtime. 05/14/23  Yes Ghimire, Werner Lean, MD  albuterol (PROVENTIL) (2.5 MG/3ML) 0.083% nebulizer solution Take 3 mLs (2.5 mg total) by nebulization every 6 (six) hours as needed for wheezing or shortness of breath. Patient taking differently: Take 2.5 mg by nebulization 3 (three) times daily. 05/14/23  Yes  Ghimire, Werner Lean, MD  albuterol (VENTOLIN HFA) 108 (90 Base) MCG/ACT inhaler Inhale 2 puffs into the lungs every 6 (six) hours as needed for shortness of breath (COPD). 05/14/23  Yes Ghimire, Werner Lean, MD  allopurinol (ZYLOPRIM) 100 MG tablet Take 1 tablet (100 mg total) by mouth daily. 09/11/23  Yes Leroy Sea, MD  budesonide-formoterol [facility]) 160-4.5 MCG/ACT inhaler Inhale 2 puffs into the lungs in the morning and at bedtime. 05/14/23  Yes Ghimire, Werner Lean, MD  clopidogrel (PLAVIX) 75 MG tablet Take 75 mg by mouth daily.   Yes [provider]  colchicine 0.6 MG tablet Take 0.5 tablets (0.3 mg total) by mouth daily. 08/29/23  Yes Leroy Sea, MD  diclofenac Sodium (VOLTAREN) 1 % GEL Apply 2 g topically 4 (four) times daily. Patient taking differently: Apply 1 Application topically daily. 04/17/23  Yes Marlin Canary U, DO  docusate sodium (COLACE) 100 MG capsule Take 1 capsule (100 mg total) by mouth daily. 05/14/23  Yes Ghimire, Werner Lean, MD  fluticasone (FLONASE) 50 MCG/ACT nasal spray Place 1 spray into both nostrils daily. Patient taking differently: Place 2 sprays into both nostrils daily. 05/14/23  Yes Ghimire, Werner Lean, MD  insulin lispro (HUMALOG KWIKPEN) 100 UNIT/ML KwikPen 0-15 Units, Subcutaneous, 3 times daily with meal CBG < 70: Implement Hypoglycemia meausers CBG 70 - 120: 0 units CBG 121 - 150: 2 units CBG 151 - 200: 3 units CBG 201 - 250: 5 units CBG 251 - 300: 8 units CBG 301 - 350: 11 units CBG 351 - 400:  15 units CBG > 400: call MD Patient taking differently: Inject 0-15 Units into the skin daily. 05/14/23  Yes Ghimire, Werner Lean, MD  levothyroxine (SYNTHROID) 175 MCG tablet Take 1 tablet (175 mcg total) by mouth daily. 05/14/23  Yes Ghimire, Werner Lean, MD  lidocaine-prilocaine (EMLA) cream Apply 1 Application topically See admin instructions. On dialysis days (Tues,Thurs,Sat) 02/13/22  Yes [provider]  meclizine (ANTIVERT) 25 MG tablet Take 25 mg by  mouth as needed for dizziness or nausea. 06/12/23  Yes [provider]  Multiple Vitamins-Iron (MULTIVITAMINS WITH IRON) TABS tablet Take 1 tablet by mouth in the morning. 05/14/23  Yes Ghimire, Werner Lean, MD  OXYGEN Inhale 3 L into the lungs.   Yes [provider]  sevelamer carbonate (RENVELA) 800 MG tablet Take 2 tablets (1,600 mg total) by mouth 3 (three) times daily with meals. 05/14/23  Yes Ghimire, Werner Lean, MD  thiamine (VITAMIN B1) 100 MG tablet Take 1 tablet (100 mg total) by mouth in the morning. 05/14/23  Yes Ghimire, Werner Lean, MD  apixaban (ELIQUIS) 5 MG TABS tablet Take 1 tablet (5 mg total) by mouth 2 (two) times daily. 07/20/23   Ghimire, Werner Lean, MD  metoprolol tartrate (LOPRESSOR) 25 MG tablet Take 1.5 tablets (37.5 mg total) by mouth 2 (two) times daily. Patient not taking: Reported on 09/10/2023 07/20/23   Maretta Bees, MD  midodrine (PROAMATINE) 5 MG tablet Take 3 tablets (15 mg total) by mouth every 8 (eight) hours. Patient not taking: Reported on 09/10/2023 07/20/23   Maretta Bees, MD  nitroGLYCERIN (NITROSTAT) 0.4 MG SL tablet Place 1 tablet (0.4 mg total) under the tongue every 5 (five) minutes as needed for chest pain. Patient not taking: Reported on 09/10/2023 07/06/23 07/05/24  Cyndi Bender, NP  rosuvastatin (CRESTOR) 20 MG tablet Take 1 tablet (20 mg total) by mouth daily. Patient not taking: Reported on 09/10/2023 07/06/23   Cyndi Bender, NP     Critical care time: 35 minutes    Simonne Martinet ACNP-BC [facility] Pager # [phone] OR # [phone] if no answer

## 2023-09-10 NOTE — ED Notes (Signed)
RT called to room due to shortness of breath and low SpO2. Upon arrival pt on 4L nasal cannula, lethargic and SpO2 in the 70's. Pt placed back on bipap at this time without complication. Pt SpO2 quickly rose to 95%. RT will continue to monitor and be available as needed.

## 2023-09-10 NOTE — ED Notes (Signed)
Pt had audible wheeze and was saying she was SOB. Will give the pt a breathing treatment.

## 2023-09-10 NOTE — Assessment & Plan Note (Signed)
Patient critically ill with uncontrolled hyperglycemia.  Plan to add insulin sliding scale for glucose cover and monitoring.

## 2023-09-10 NOTE — Progress Notes (Signed)
Echocardiogram 2D Echocardiogram has been performed.  [name] [name] 09/10/2023, 1:26 PM

## 2023-09-10 NOTE — ED Notes (Signed)
Pt taken off BiPAP and placed on 3L NC.

## 2023-09-10 NOTE — ED Notes (Signed)
Pt taken off bipap at this time and placed on 3L nasal cannula without complication. RT will continue to monitor and be available as needed.

## 2023-09-10 NOTE — Progress Notes (Signed)
Progress Note   Patient: [name] [name] MWU:[id number] DOB: September 17, 1951 DOA: 09/09/2023     0 DOS: the patient was seen and examined on 09/10/2023   Brief hospital course: [name] [name] was admitted to the hospital with the working diagnosis of volume overload in the setting of ESRD.   72 yo female with the past medical history of ESRD, heart failure, COPD, hypertension, T2DM, atrial fibrillation, coronary artery disease, and small cell lung cancer (sp radiation 2023), who presented with dyspnea. Her last HD was 48 hrs prior to admission, on Saturday.  Patient reported acute progressive and severe dyspnea, associated with chest tightness, prompting her to call EMS. She was found with 02 saturation in the 80s, she was placed on supplemental 02 per [facility] and was transported to the ED, where she was placed on Bipap due to respiratory distress. On her initial physical examination her blood pressure was 122/45, HR 70, RR 24 and 02 saturation 100% on bipap, lungs with diffuse bilateral rales, with poor inspiratory effort, heart with S1 and S2 present and regular with no gallops, rubs or murmurs, abdomen with no distention and positive lower extremity edema,   Chest radiograph with cardiomegaly with bilateral hilar vascular congestion, bilateral central interstitial infiltrates, moderate left pleural effusion.   EKG 92 bpm, left axis deviation, left anterior fascicular block, sinus rhythm with PVC, no significant ST segment or T wave changes.   Nephrology was contacted for renal replacement therapy with ultrafiltration.   Assessment and Plan: * Acute on chronic systolic CHF (congestive heart failure) (HCC) Echocardiogram with reduced systolic function with EF 20 to 25%, global hypokinesis, LA with severe dilatation, RVSP 81,3 mmHg. No significant valvular disease.   Patient has been on midodrine at home, 15 mg tid for blood pressure control.  May need vasopressor support, for hypotension.    ESRD on  dialysis [facility]) Patient with volume overload.  Hyperkalemia.   Acute hypoxemic respiratory failure due to acute pulmonary edema with left pleural effusion. She has been on Bipap, only transitory of nasal cannula high flow.  Now she has been transported to HD and blood pressure noted to be low.  I spoke Nephrology Dr Ronalee Belts, will attempt HD with ultrafiltration, but may need vasopressor support in order to do renal replacement therapy, ([facility])  Consulting critical care, patient may need to be transfer to ICU if not able to perform regular dialysis.   CAD (coronary artery disease) No chest pain, no clinical signs of acute coronary syndrome. Elevated troponin likely due to volume overload.    Paroxysmal atrial fibrillation (HCC) Continue anticoagulation with apixaban.  Hold on AV blockade for now.   DM (diabetes mellitus), type 2 with renal complications [facility]) Patient critically ill with uncontrolled hyperglycemia.  Plan to add insulin sliding scale for glucose cover and monitoring.   Class 2 obesity due to excess calories with body mass index (BMI) of 36.0 to 36.9 in adult Calculated BMI is 37         Subjective: patient with dyspnea, no chest pain,   Physical Exam: Vitals:   09/10/23 1500 09/10/23 1505 09/10/23 1515 09/10/23 1545  BP: (!) 74/53 (!) 81/55 (!) 86/55 (!) 79/49  Pulse: 91 99 92 83  Resp: (!) 23 20 (!) 24   Temp:      TempSrc:      SpO2: 95% 96% 99% 97%  Weight:      Height:       Examination at 9:00 am Neurology patient somnolent but  easy to arouse  ENT with mild pallor  Cardiovascular with S1 and S2 present and regular with no gallops, rubs or murmurs Respiratory with rales and rhonchi, with no wheezing, decreased breath sounds at the left base  Abdomen with no distention  Data Reviewed:    Family Communication: no family at the bedside   Disposition: Status is: Inpatient Remains inpatient appropriate because: volume overload   Planned Discharge  Destination: Home    Author: [name], MD 09/10/2023 4:05 PM  For on call review [email or web address].

## 2023-09-10 NOTE — Consult Note (Signed)
[name] Renal Consultation Note    Indication for Consultation:  Management of ESRD/hemodialysis; anemia, hypertension/volume and secondary hyperparathyroidism  ZOX:WRUE, Deneda T, FNP  HPI: [name] [name] is a 72 y.o. female with ESRD on HD TTS at [facility].  Past medical history significant for non obstructed CAD, chronic combined CHF, Afib, OSA, mild aortic stenosis, HTN, HLD, hyperthyroidism, DMT2, PVD, TIA, O2 dependent COPD, Hx non small cell lung cancer s/p radiation/in remission and obesity.   Patient presented to the ED due to SOB.  Majority of history obtained through chart review.  Patient saturating in the 80s per EMS on home O2.  In the ED has required BiPAP on and off.  Reports she started to become more SOB this AM.  Does not answer other questions but able to follow commands.  When initially entered the room, wheezing with increased WOB with desat to the 70s. RN called RT to place back on BiPAP. O2 sat improved on their own to upper 80s prior to BiPAP being applied, then 100 once applied.  Per outpatient chart review she is compliant with treatments but has been unable to meet dry weight since last hospital discharge.    Pertinent findings in the ED include tachypnea, hypoxia requiring BiPAP, WBC 15, troponin 172, K 5.8 and CXR with mild to moderate severity pulmonary vascular congestion, moderate to marked severity left basilar atelectasis and/or infiltrate and small to moderate size left pleural effusion.  Patient admitted for further evaluation and management.    Past Medical History:  Diagnosis Date   Anemia    Arthritis    Asthma    Chronic diastolic (congestive) heart failure (HCC)    Chronic kidney disease    Dialysis T-TH-Sat   Chronic pain syndrome    Class 2 obesity due to excess calories with body mass index (BMI) of 36.0 to 36.9 in adult    COPD (chronic obstructive pulmonary disease) (HCC)    Diabetes mellitus without complication (HCC)     Type 1 Insulin Dependent   Full dentures    GERD (gastroesophageal reflux disease)    Gout    History of hiatal hernia    History of transfusion    Hx MRSA infection    abscess left groin   Hyperlipidemia    Hypertension    Hypothyroid    Intractable nausea and vomiting 02/15/2021   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HCC)    Peripheral vascular disease (HCC)    Sleep apnea    UNABLE TO TOLERATE C PAP   Stress incontinence    TIA (transient ischemic attack)     X2 NO RESIDUAL PROBLEMS   Wears glasses    Past Surgical History:  Procedure Laterality Date   ABDOMINAL HYSTERECTOMY     AV FISTULA PLACEMENT Left 12/01/2021   Procedure: INSERTION OF LEFT ARM ARTERIOVENOUS (AV) GORE-TEX GRAFT;  Surgeon: Nada Libman, MD;  Location: [facility] OR;  Service: Vascular;  Laterality: Left;   BACK SURGERY  2020   BASCILIC VEIN TRANSPOSITION Left 04/05/2020   Procedure: BASILIC VEIN TRANSPOSITION FIRST STAGE;  Surgeon: Chuck Hint, MD;  Location: [facility] OR;  Service: Vascular;  Laterality: Left;   BIOPSY  08/28/2023   Procedure: BIOPSY;  Surgeon: Kerin Salen, MD;  Location: [facility] ENDOSCOPY;  Service: Gastroenterology;;   BOTOX INJECTION  08/28/2023   Procedure: BOTOX INJECTION;  Surgeon: Kerin Salen, MD;  Location: [facility] ENDOSCOPY;  Service: Gastroenterology;;   BRONCHIAL BIOPSY  01/23/2022   Procedure: BRONCHIAL BIOPSIES;  Surgeon: Josephine Igo, DO;  Location: [facility] ENDOSCOPY;  Service: Pulmonary;;   BRONCHIAL BIOPSY  08/07/2022   Procedure: BRONCHIAL BIOPSIES;  Surgeon: Josephine Igo, DO;  Location: [facility] ENDOSCOPY;  Service: Pulmonary;;   BRONCHIAL BRUSHINGS  08/07/2022   Procedure: BRONCHIAL BRUSHINGS;  Surgeon: Josephine Igo, DO;  Location: [facility] ENDOSCOPY;  Service: Pulmonary;;   BRONCHIAL NEEDLE ASPIRATION BIOPSY  01/23/2022   Procedure: BRONCHIAL NEEDLE ASPIRATION BIOPSIES;  Surgeon: Josephine Igo, DO;  Location: [facility] ENDOSCOPY;  Service: Pulmonary;;   BRONCHIAL NEEDLE ASPIRATION BIOPSY   08/07/2022   Procedure: BRONCHIAL NEEDLE ASPIRATION BIOPSIES;  Surgeon: Josephine Igo, DO;  Location: [facility] ENDOSCOPY;  Service: Pulmonary;;   COLON SURGERY  1995   DUE TO POLYP   DILATION AND CURETTAGE OF UTERUS     ENDOBRONCHIAL ULTRASOUND  01/23/2022   Procedure: ENDOBRONCHIAL ULTRASOUND;  Surgeon: Josephine Igo, DO;  Location: [facility] ENDOSCOPY;  Service: Pulmonary;;   ENTEROSCOPY N/A 07/10/2021   Procedure: ENTEROSCOPY;  Surgeon: Kerin Salen, MD;  Location: [facility] ENDOSCOPY;  Service: Gastroenterology;  Laterality: N/A;   ESOPHAGEAL DILATION  08/28/2023   Procedure: ESOPHAGEAL BALLOON  DILATION;  Surgeon: Kerin Salen, MD;  Location: [facility] ENDOSCOPY;  Service: Gastroenterology;;   ESOPHAGOGASTRODUODENOSCOPY N/A 01/25/2021   Procedure: ESOPHAGOGASTRODUODENOSCOPY (EGD);  Surgeon: Vida Rigger, MD;  Location: [facility] ENDOSCOPY;  Service: Endoscopy;  Laterality: N/A;   ESOPHAGOGASTRODUODENOSCOPY (EGD) WITH PROPOFOL N/A 08/28/2023   Procedure: ESOPHAGOGASTRODUODENOSCOPY (EGD) WITH PROPOFOL;  Surgeon: Kerin Salen, MD;  Location: [facility] ENDOSCOPY;  Service: Gastroenterology;  Laterality: N/A;   FINE NEEDLE ASPIRATION  01/23/2022   Procedure: FINE NEEDLE ASPIRATION (FNA) LINEAR;  Surgeon: Josephine Igo, DO;  Location: [facility] ENDOSCOPY;  Service: Pulmonary;;   FOREARM FRACTURE SURGERY     Left arm   GIVENS CAPSULE STUDY N/A 07/07/2021   Procedure: GIVENS CAPSULE STUDY;  Surgeon: Vida Rigger, MD;  Location: [facility] ENDOSCOPY;  Service: Endoscopy;  Laterality: N/A;   HERNIA REPAIR     w/ mesh   HOT HEMOSTASIS N/A 07/10/2021   Procedure: HOT HEMOSTASIS (ARGON PLASMA COAGULATION/BICAP);  Surgeon: Kerin Salen, MD;  Location: [facility] ENDOSCOPY;  Service: Gastroenterology;  Laterality: N/A;   INCISION AND DRAINAGE ABSCESS Left 02/04/2013   Procedure: INCISION AND DRAINAGE LEFT BUTTOCK ABSCESS; INCISION AND DRAINAGE LEFT BREAST ABSCESS;  Surgeon: Shelly Rubenstein, MD;  Location: [facility] OR;  Service: General;  Laterality: Left;   INCISION  AND DRAINAGE ABSCESS N/A 02/12/2013   Procedure: INCISION AND DEBRIDEMENT BUTTOCK WOUND ;  Surgeon: Wilmon Arms. Corliss Skains, MD;  Location: [facility] OR;  Service: General;  Laterality: N/A;   INCISION AND DRAINAGE ABSCESS N/A 02/14/2013   Procedure: INCISION AND DRAINAGE/DRESSING CHANGE;  Surgeon: Shelly Rubenstein, MD;  Location: [facility];  Service: General;  Laterality: N/A;   INCISION AND DRAINAGE ABSCESS N/A 03/21/2015   Procedure: INCISION AND DRAINAGE PUBIC ABSCESS;  Surgeon: Glenna Fellows, MD;  Location: [facility] ORS;  Service: General;  Laterality: N/A;   INCISION AND DRAINAGE PERIRECTAL ABSCESS Left 02/10/2013   Procedure: IRRIGATION AND DEBRIDEMENT OF BUTTOCK/PERINEAL ABSCESS;  Surgeon: Wilmon Arms. Corliss Skains, MD;  Location: [facility] OR;  Service: General;  Laterality: Left;   INCISION AND DRAINAGE PERIRECTAL ABSCESS N/A 02/16/2013   Procedure: IRRIGATION AND DEBRIDEMENT PERINEAL ABSCESS;  Surgeon: Liz Malady, MD;  Location[facility];  Service: General;  Laterality: N/A;   IR FLUORO GUIDE CV LINE RIGHT  11/30/2021   IR FLUORO GUIDE CV LINE RIGHT  08/22/2023   IR REMOVAL TUN CV CATH  W/O FL  08/29/2023   IR US GUIDE VASC ACCESS RIGHT  11/30/2021   IR US GUIDE VASC ACCESS RIGHT  08/22/2023   IRRIGATION AND DEBRIDEMENT ABSCESS N/A 02/06/2013   Procedure: IRRIGATION AND DEBRIDEMENT BUTTOCK ABSCESS AND DRESSING CHANGE;  Surgeon: Shelly Rubenstein, MD;  [facility];  Service: General;  Laterality: N/A;   IRRIGATION AND DEBRIDEMENT ABSCESS Left 02/08/2013   Procedure: IRRIGATION AND DEBRIDEMENT ABSCESS/DRESSING CHANGE;  Surgeon: Cherylynn Ridges, MD;  Location: [facility];  Service: General;  Laterality: Left;   JOINT REPLACEMENT  2010 / 2012    LAPAROSCOPIC CHOLECYSTECTOMY     LEFT HEART CATH AND CORONARY ANGIOGRAPHY N/A 07/05/2023   Procedure: LEFT HEART CATH AND CORONARY ANGIOGRAPHY;  Surgeon: Lennette Bihari, MD;  [facility]: [facility] INVASIVE CV LAB;  Service: Cardiovascular;  Laterality: N/A;   LEFT HEART CATHETERIZATION WITH CORONARY  ANGIOGRAM N/A 07/02/2014   Procedure: LEFT HEART CATHETERIZATION WITH CORONARY ANGIOGRAM;  Surgeon: Runell Gess, MD;  Location: [facility] CATH LAB;  Service: Cardiovascular;  Laterality: N/A;   LOWER EXTREMITY ANGIOGRAM Right 04/20/2016   Procedure: Lower Extremity Angiogram;  Surgeon: Sherren Kerns, MD;  Location: [facility] INVASIVE CV LAB;  Service: Cardiovascular;  Laterality: Right;   MULTIPLE TOOTH EXTRACTIONS     PERIPHERAL VASCULAR CATHETERIZATION N/A 04/20/2016   Procedure: Abdominal Aortogram;  Surgeon: Sherren Kerns, MD;  Location: [facility] INVASIVE CV LAB;  Service: Cardiovascular;  Laterality: N/A;   PERIPHERAL VASCULAR CATHETERIZATION Right 04/20/2016   Procedure: Peripheral Vascular Intervention;  Surgeon: Sherren Kerns, MD;  Location: [facility] INVASIVE CV LAB;  Service: Cardiovascular;  Laterality: Right;  popiteal   RIGHT HEART CATH N/A 10/27/2018   Procedure: RIGHT HEART CATH;  Surgeon: Laurey Morale, MD;  Location: [facility] INVASIVE CV LAB;  Service: Cardiovascular;  Laterality: N/A;   RIGHT HEART CATH N/A 03/20/2019   Procedure: RIGHT HEART CATH;  Surgeon: Laurey Morale, MD;  Location: [facility] INVASIVE CV LAB;  Service: Cardiovascular;  Laterality: N/A;   THYROIDECTOMY     TOTAL HIP REVISION Right 08/11/2014   Procedure: RIGHT ACETABULAR REVISION;  Surgeon: Loanne Drilling, MD;  Location: [facility] ORS;  Service: Orthopedics;  Laterality: Right;   VASCULAR SURGERY     VIDEO BRONCHOSCOPY WITH ENDOBRONCHIAL ULTRASOUND Bilateral 08/07/2022   Procedure: VIDEO BRONCHOSCOPY WITH ENDOBRONCHIAL ULTRASOUND;  Surgeon: Josephine Igo, DO;  Location: [facility] ENDOSCOPY;  Service: Pulmonary;  Laterality: Bilateral;   VIDEO BRONCHOSCOPY WITH RADIAL ENDOBRONCHIAL ULTRASOUND  01/23/2022   Procedure: RADIAL ENDOBRONCHIAL ULTRASOUND;  Surgeon: Josephine Igo, DO;  Location: [facility] ENDOSCOPY;  Service: Pulmonary;;   Family History  Problem Relation Age of Onset   Diabetes Brother    Cardiomyopathy Mother    Heart disease Mother     Cancer Father    Social History:  reports that she quit smoking about 23 months ago. Her smoking use included cigarettes. She started smoking about 41 years ago. She has a 10 pack-year smoking history. She has been exposed to tobacco smoke. She has quit using smokeless tobacco. She reports current alcohol use of about 1.0 standard drink of alcohol per week. She reports that she does not use drugs. Allergies  Allergen Reactions   Nebivolol Swelling    Chest pain (reaction to Bystolic)   Zestril [Lisinopril] Swelling and Other (See Comments)   Ace Inhibitors Swelling and Other (See Comments)    Tongue swell   Morphine And Codeine Itching   Prior to Admission medications   Medication Sig  Start Date End Date Taking? Authorizing Provider  acetaminophen (TYLENOL) 500 MG tablet Take 1 tablet (500 mg total) by mouth See admin instructions. Take 1,000mg  (2 tablets) by mouth twice a day in addition to 1,000mg  (2 tablets) twice daily as needed for pain. Patient taking differently: Take 1,000 mg by mouth in the morning and at bedtime. 05/14/23  Yes Ghimire, Werner Lean, MD  albuterol (PROVENTIL) (2.5 MG/3ML) 0.083% nebulizer solution Take 3 mLs (2.5 mg total) by nebulization every 6 (six) hours as needed for wheezing or shortness of breath. Patient taking differently: Take 2.5 mg by nebulization 3 (three) times daily. 05/14/23  Yes Ghimire, Werner Lean, MD  albuterol (VENTOLIN HFA) 108 (90 Base) MCG/ACT inhaler Inhale 2 puffs into the lungs every 6 (six) hours as needed for shortness of breath (COPD). 05/14/23  Yes Ghimire, Werner Lean, MD  allopurinol (ZYLOPRIM) 100 MG tablet Take 1 tablet (100 mg total) by mouth daily. 09/11/23  Yes Leroy Sea, MD  budesonide-formoterol [facility]) 160-4.5 MCG/ACT inhaler Inhale 2 puffs into the lungs in the morning and at bedtime. 05/14/23  Yes Ghimire, Werner Lean, MD  clopidogrel (PLAVIX) 75 MG tablet Take 75 mg by mouth daily.   Yes [provider]  colchicine  0.6 MG tablet Take 0.5 tablets (0.3 mg total) by mouth daily. 08/29/23  Yes Leroy Sea, MD  diclofenac Sodium (VOLTAREN) 1 % GEL Apply 2 g topically 4 (four) times daily. Patient taking differently: Apply 1 Application topically daily. 04/17/23  Yes Marlin Canary U, DO  docusate sodium (COLACE) 100 MG capsule Take 1 capsule (100 mg total) by mouth daily. 05/14/23  Yes Ghimire, Werner Lean, MD  fluticasone (FLONASE) 50 MCG/ACT nasal spray Place 1 spray into both nostrils daily. Patient taking differently: Place 2 sprays into both nostrils daily. 05/14/23  Yes Ghimire, Werner Lean, MD  insulin lispro (HUMALOG KWIKPEN) 100 UNIT/ML KwikPen 0-15 Units, Subcutaneous, 3 times daily with meal CBG < 70: Implement Hypoglycemia meausers CBG 70 - 120: 0 units CBG 121 - 150: 2 units CBG 151 - 200: 3 units CBG 201 - 250: 5 units CBG 251 - 300: 8 units CBG 301 - 350: 11 units CBG 351 - 400: 15 units CBG > 400: call MD Patient taking differently: Inject 0-15 Units into the skin daily. 05/14/23  Yes Ghimire, Werner Lean, MD  levothyroxine (SYNTHROID) 175 MCG tablet Take 1 tablet (175 mcg total) by mouth daily. 05/14/23  Yes Ghimire, Werner Lean, MD  lidocaine-prilocaine (EMLA) cream Apply 1 Application topically See admin instructions. On dialysis days (Tues,Thurs,Sat) 02/13/22  Yes [provider]  meclizine (ANTIVERT) 25 MG tablet Take 25 mg by mouth as needed for dizziness or nausea. 06/12/23  Yes [provider]  Multiple Vitamins-Iron (MULTIVITAMINS WITH IRON) TABS tablet Take 1 tablet by mouth in the morning. 05/14/23  Yes Ghimire, Werner Lean, MD  OXYGEN Inhale 3 L into the lungs.   Yes [provider]  sevelamer carbonate (RENVELA) 800 MG tablet Take 2 tablets (1,600 mg total) by mouth 3 (three) times daily with meals. 05/14/23  Yes Ghimire, Werner Lean, MD  thiamine (VITAMIN B1) 100 MG tablet Take 1 tablet (100 mg total) by mouth in the morning. 05/14/23  Yes Ghimire, Werner Lean, MD  apixaban (ELIQUIS) 5  MG TABS tablet Take 1 tablet (5 mg total) by mouth 2 (two) times daily. 07/20/23   Ghimire, Werner Lean, MD  metoprolol tartrate (LOPRESSOR) 25 MG tablet Take 1.5 tablets (37.5 mg total) by  mouth 2 (two) times daily. Patient not taking: Reported on 09/10/2023 07/20/23   Maretta Bees, MD  midodrine (PROAMATINE) 5 MG tablet Take 3 tablets (15 mg total) by mouth every 8 (eight) hours. Patient not taking: Reported on 09/10/2023 07/20/23   Maretta Bees, MD  nitroGLYCERIN (NITROSTAT) 0.4 MG SL tablet Place 1 tablet (0.4 mg total) under the tongue every 5 (five) minutes as needed for chest pain. Patient not taking: Reported on 09/10/2023 07/06/23 07/05/24  Cyndi Bender, NP  rosuvastatin (CRESTOR) 20 MG tablet Take 1 tablet (20 mg total) by mouth daily. Patient not taking: Reported on 09/10/2023 07/06/23   Cyndi Bender, NP   Current Facility-Administered Medications  Medication Dose Route Frequency Provider Last Rate Last Admin   acetaminophen (TYLENOL) tablet 650 mg  650 mg Oral Q6H PRN Dow Adolph N, DO       albuterol (PROVENTIL) (2.5 MG/3ML) 0.083% nebulizer solution 2.5 mg  2.5 mg Nebulization Q6H PRN Dow Adolph N, DO   2.5 mg at 09/10/23 1234   apixaban (ELIQUIS) tablet 5 mg  5 mg Oral BID Dow Adolph N, DO   5 mg at 09/10/23 0935   [START ON 09/11/2023] Chlorhexidine Gluconate Cloth 2 % PADS 6 each  6 each Topical Q0600 Bee Marchiano, Lillia Abed, PA       collagenase (SANTYL) ointment   Topical Daily Arrien, York Ram, MD       levothyroxine (SYNTHROID) tablet 175 mcg  175 mcg Oral Daily Darlin Drop, DO   175 mcg at 09/10/23 0109   melatonin tablet 5 mg  5 mg Oral QHS PRN Darlin Drop, DO       mometasone-formoterol (DULERA) 200-5 MCG/ACT inhaler 2 puff  2 puff Inhalation BID Dow Adolph N, DO       multivitamins with iron tablet 1 tablet  1 tablet Oral q AM Dow Adolph N, DO   1 tablet at 09/10/23 0935   perflutren lipid microspheres (DEFINITY) IV suspension  1-10 mL Intravenous PRN  Dow Adolph N, DO   3 mL at 09/10/23 1327   polyethylene glycol (MIRALAX / GLYCOLAX) packet 17 g  17 g Oral Daily PRN Dow Adolph N, DO       prochlorperazine (COMPAZINE) injection 5 mg  5 mg Intravenous Q6H PRN Dow Adolph N, DO       sevelamer carbonate (RENVELA) tablet 1,600 mg  1,600 mg Oral TID WC Hall, Carole N, DO   1,600 mg at 09/10/23 1234   thiamine (VITAMIN B1) tablet 100 mg  100 mg Oral q AM Dow Adolph N, DO   100 mg at 09/10/23 0703   Current Outpatient Medications  Medication Sig Dispense Refill   acetaminophen (TYLENOL) 500 MG tablet Take 1 tablet (500 mg total) by mouth See admin instructions. Take 1,000mg  (2 tablets) by mouth twice a day in addition to 1,000mg  (2 tablets) twice daily as needed for pain. (Patient taking differently: Take 1,000 mg by mouth in the morning and at bedtime.)     albuterol (PROVENTIL) (2.5 MG/3ML) 0.083% nebulizer solution Take 3 mLs (2.5 mg total) by nebulization every 6 (six) hours as needed for wheezing or shortness of breath. (Patient taking differently: Take 2.5 mg by nebulization 3 (three) times daily.) 90 mL 1   albuterol (VENTOLIN HFA) 108 (90 Base) MCG/ACT inhaler Inhale 2 puffs into the lungs every 6 (six) hours as needed for shortness of breath (COPD). 18 g 1   [START ON 09/11/2023] allopurinol (ZYLOPRIM) 100 MG  tablet Take 1 tablet (100 mg total) by mouth daily.     budesonide-formoterol (SYMBICORT) 160-4.5 MCG/ACT inhaler Inhale 2 puffs into the lungs in the morning and at bedtime. 10.2 g 12   clopidogrel (PLAVIX) 75 MG tablet Take 75 mg by mouth daily.     colchicine 0.6 MG tablet Take 0.5 tablets (0.3 mg total) by mouth daily. 12 tablet 0   diclofenac Sodium (VOLTAREN) 1 % GEL Apply 2 g topically 4 (four) times daily. (Patient taking differently: Apply 1 Application topically daily.)     docusate sodium (COLACE) 100 MG capsule Take 1 capsule (100 mg total) by mouth daily. 100 capsule 0   fluticasone (FLONASE) 50 MCG/ACT nasal spray Place  1 spray into both nostrils daily. (Patient taking differently: Place 2 sprays into both nostrils daily.) 16 g 2   insulin lispro (HUMALOG KWIKPEN) 100 UNIT/ML KwikPen 0-15 Units, Subcutaneous, 3 times daily with meal CBG < 70: Implement Hypoglycemia meausers CBG 70 - 120: 0 units CBG 121 - 150: 2 units CBG 151 - 200: 3 units CBG 201 - 250: 5 units CBG 251 - 300: 8 units CBG 301 - 350: 11 units CBG 351 - 400: 15 units CBG > 400: call MD (Patient taking differently: Inject 0-15 Units into the skin daily.) 15 mL 11   levothyroxine (SYNTHROID) 175 MCG tablet Take 1 tablet (175 mcg total) by mouth daily. 30 tablet 3   lidocaine-prilocaine (EMLA) cream Apply 1 Application topically See admin instructions. On dialysis days (Tues,Thurs,Sat)     meclizine (ANTIVERT) 25 MG tablet Take 25 mg by mouth as needed for dizziness or nausea.     Multiple Vitamins-Iron (MULTIVITAMINS WITH IRON) TABS tablet Take 1 tablet by mouth in the morning. 30 tablet 1   OXYGEN Inhale 3 L into the lungs.     sevelamer carbonate (RENVELA) 800 MG tablet Take 2 tablets (1,600 mg total) by mouth 3 (three) times daily with meals. 180 tablet 2   thiamine (VITAMIN B1) 100 MG tablet Take 1 tablet (100 mg total) by mouth in the morning. 30 tablet 2   apixaban (ELIQUIS) 5 MG TABS tablet Take 1 tablet (5 mg total) by mouth 2 (two) times daily. 60 tablet 2   metoprolol tartrate (LOPRESSOR) 25 MG tablet Take 1.5 tablets (37.5 mg total) by mouth 2 (two) times daily. (Patient not taking: Reported on 09/10/2023) 90 tablet 1   midodrine (PROAMATINE) 5 MG tablet Take 3 tablets (15 mg total) by mouth every 8 (eight) hours. (Patient not taking: Reported on 09/10/2023) 270 tablet 1   nitroGLYCERIN (NITROSTAT) 0.4 MG SL tablet Place 1 tablet (0.4 mg total) under the tongue every 5 (five) minutes as needed for chest pain. (Patient not taking: Reported on 09/10/2023) 20 tablet 2   rosuvastatin (CRESTOR) 20 MG tablet Take 1 tablet (20 mg total) by mouth daily.  (Patient not taking: Reported on 09/10/2023) 30 tablet 2   Labs: Basic Metabolic Panel: Recent Labs  Lab 09/09/23 2149 09/09/23 2152 09/10/23 0534 09/10/23 0540  NA 142 138 140 137  K 5.7* 5.6* 6.0* 5.8*  CL 98  --  98  --   CO2 25  --  22  --   GLUCOSE 198*  --  205*  --   BUN 105*  --  106*  --   CREATININE 8.09*  --  8.31*  --   CALCIUM 10.1  --  10.0  --   PHOS  --   --  5.7*  --  Liver Function Tests: Recent Labs  Lab 09/09/23 2149 09/10/23 0534  AST 27  --   ALT 17  --   ALKPHOS 102  --   BILITOT 0.6  --   PROT 6.7  --   ALBUMIN 3.3* 3.3*   CBC: Recent Labs  Lab 09/09/23 2149 09/09/23 2152 09/10/23 0534 09/10/23 0540  WBC 16.7*  --  15.0*  --   NEUTROABS 14.3*  --   --   --   HGB 9.2* 10.9* 9.1* 10.5*  HCT 31.1* 32.0* 32.2* 31.0*  MCV 105.4*  --  107.7*  --   PLT 239  --  226  --    Studies/Results: ECHOCARDIOGRAM LIMITED  Result Date: 09/10/2023    ECHOCARDIOGRAM LIMITED REPORT   Patient Name:   [name] [name] Date of Exam: 09/10/2023 Medical Rec #:  [id number]         Height:       63.0 in Accession #:    [id number]        Weight:       212.5 lb Date of Birth:  [birth year]          BSA:          1.984 m Patient Age:    72 years          BP:           121/53 mmHg Patient Gender: F                 HR:           66 bpm. Exam Location:  Inpatient Procedure: Limited Echo, Cardiac Doppler, Color Doppler and Intracardiac            Opacification Agent Indications:    CHF Acute Systolic  History:        Patient has prior history of Echocardiogram examinations, most                 recent 04/15/2023. Risk Factors:Diabetes, Dyslipidemia,                 Hypertension, Sleep Apnea and Former Smoker.  Sonographer:    Karma Ganja Referring Phys: [id number] CAROLE N HALL  Sonographer Comments: Technically difficult study due to poor echo windows and patient is obese. Image acquisition challenging due to patient body habitus and Image acquisition challenging due to patient  behavioral factors. IMPRESSIONS  1. No LV thrombus noted. Left ventricular ejection fraction, by estimation, is 20 to 25%. The left ventricle has severely decreased function. The left ventricle demonstrates global hypokinesis. The left ventricular internal cavity size was mildly dilated. There is mild concentric left ventricular hypertrophy.  2. Left atrial size was severely dilated.  3. Moderate pleural effusion in the left lateral region.  4. The mitral valve was not well visualized. Trivial mitral valve regurgitation. No evidence of mitral stenosis.  5. The aortic valve was not well visualized.  6. There is severely elevated pulmonary artery systolic pressure. The estimated right ventricular systolic pressure is 81.3 mmHg.  7. The inferior vena cava is dilated in size with <50% respiratory variability, suggesting right atrial pressure of 15 mmHg. Comparison(s): Unable to view comparison study, LVEF is worse that reported. FINDINGS  Left Ventricle: No LV thrombus noted. Left ventricular ejection fraction, by estimation, is 20 to 25%. The left ventricle has severely decreased function. The left ventricle demonstrates global hypokinesis. Definity contrast agent was given IV to delineate the left ventricular endocardial borders. The left  ventricular internal cavity size was mildly dilated. There is mild concentric left ventricular hypertrophy. Right Ventricle: There is severely elevated pulmonary artery systolic pressure. The tricuspid regurgitant velocity is 4.07 m/s, and with an assumed right atrial pressure of 15 mmHg, the estimated right ventricular systolic pressure is 81.3 mmHg. Left Atrium: Left atrial size was severely dilated. Mitral Valve: The mitral valve was not well visualized. Trivial mitral valve regurgitation. No evidence of mitral valve stenosis. Aortic Valve: The aortic valve was not well visualized. Pulmonic Valve: The pulmonic valve was not well visualized. Pulmonic valve regurgitation is mild. No  evidence of pulmonic stenosis. Venous: The inferior vena cava is dilated in size with less than 50% respiratory variability, suggesting right atrial pressure of 15 mmHg. Additional Comments: There is a moderate pleural effusion in the left lateral region. Spectral Doppler performed. Color Doppler performed.  LEFT VENTRICLE PLAX 2D LVIDd:         5.90 cm LVIDs:         5.20 cm LV PW:         1.30 cm LV IVS:        1.30 cm LVOT diam:     2.10 cm LVOT Area:     3.46 cm  LV Volumes (MOD) LV vol d, MOD A2C: 95.5 ml LV vol d, MOD A4C: 157.0 ml LV vol s, MOD A2C: 88.8 ml LV vol s, MOD A4C: 129.0 ml LV SV MOD A2C:     6.7 ml LV SV MOD A4C:     157.0 ml LV SV MOD BP:      12.0 ml IVC IVC diam: 2.50 cm LEFT ATRIUM              Index LA diam:        4.40 cm  2.22 cm/m LA Vol (A2C):   119.0 ml 59.97 ml/m LA Vol (A4C):   108.0 ml 54.43 ml/m LA Biplane Vol: 117.0 ml 58.97 ml/m  TRICUSPID VALVE TR Peak grad:   66.3 mmHg TR Vmax:        407.00 cm/s  SHUNTS Systemic Diam: 2.10 cm Riley Lam MD Electronically signed by Riley Lam MD Signature Date/Time: 09/10/2023/1:57:55 PM    Final    DG Chest Port 1 View  Result Date: 09/09/2023 CLINICAL DATA:  Shortness of breath. EXAM: PORTABLE CHEST 1 VIEW COMPARISON:  August 24, 2023 FINDINGS: The right-sided venous catheter seen on the prior study has been removed. There is stable moderate severity cardiac silhouette enlargement. Mild diffusely increased interstitial lung markings are seen with mild to moderate severity prominence of the central pulmonary vasculature. Moderate to marked severity left basilar atelectasis and/or infiltrate is noted. There is a small to moderate size left pleural effusion. No pneumothorax is identified. Multilevel degenerative changes seen throughout the thoracic spine. IMPRESSION: 1. Cardiomegaly with mild to moderate severity pulmonary vascular congestion. 2. Moderate to marked severity left basilar atelectasis and/or infiltrate.  3. Small to moderate size left pleural effusion. Electronically Signed   By: Aram Candela M.D.   On: 09/09/2023 23:56    ROS: All others negative except those listed in HPI.  Physical Exam: Vitals:   09/10/23 1230 09/10/23 1245 09/10/23 1300 09/10/23 1315  BP: (!) 140/64 (!) 154/91 (!) 151/82 (!) 156/90  Pulse: 84 85 69 70  Resp: (!) 22 (!) 23 (!) 24 (!) 23  Temp:      TempSrc:      SpO2: 100% 100% 97% 96%  Weight:  Height:         General: chronically ill appearing female in mild distress Head: NCAT MMM Neck: Supple. Lungs: breath sounds decreased, +crackles b/l Heart: RRR. No murmur, rubs or gallops.  Abdomen: soft, nontender, +BS, no guarding, no rebound tenderness M/S:  Equal strength b/l in upper and lower extremities.  Lower extremities:trace LE edema Neuro: lethargic,  Dialysis Access:LU AVG +b/t  Dialysis Orders:   [facility]  4h   400/600   96.5kg  2/2 bath  LUA AVG  Heparin 2000 + 2000 intermittent - hectorol 3 mcg  - mircera 150 mcg IV q 2wks - last 11/30  Assessment/Plan:  Respiratory distress with hypoxia - pulmonary edema with moderate L pleural effusion noted on CXR. Volume overloaded, not getting to dry weight at outpatient HD leaving 2L over on Sat. Intermittently requiring BiPAP since admission.  Urgent HD ordered. BiPAP needed prior to going to HD, suspect will be able to wean off after treatment.  May require serial HD for volume removal.  Moderate L pleural effusion - May require thoracentesis.  Acute on chronic combined HF/Elevated troponin - Volume overloaded, UF as tolerated. Cardiology following. Leukocytosis - WBC 15. Afebrile. Work up per PMD.   ESRD -  on HD TTS.  Has not missed treatment. Urgent HD ordered. Will reassess tomorrow if additional treatment required.   Hypertension  - BP mildly elevated. On beta blocker.   Anemia of CKD - Hgb 10.5. ESA recently dosed.   Secondary Hyperparathyroidism -  Ca elevated. Phos close to goal.   Continue binders when eating. Hold VDRA for now.   Nutrition - Renal diet w/fluid restrictions.   Virgina Norfolk, PA-C [facility] 09/10/2023, 2:01 PM

## 2023-09-10 NOTE — ED Notes (Signed)
PA and this RN were at bedside. Pt was desatting to low 70's. RT came and put pt back on bi-pap.

## 2023-09-10 NOTE — Assessment & Plan Note (Signed)
Continue anticoagulation with apixaban.  Hold on AV blockade for now.

## 2023-09-10 NOTE — Assessment & Plan Note (Signed)
Echocardiogram with reduced systolic function with EF 20 to 25%, global hypokinesis, LA with severe dilatation, RVSP 81,3 mmHg. No significant valvular disease.   Patient has been on midodrine at home, 15 mg tid for blood pressure control.  May need vasopressor support, for hypotension.

## 2023-09-10 NOTE — Hospital Course (Addendum)
[name] [name] was admitted to the hospital with the working diagnosis of volume overload in the setting of ESRD.   72 yo female with the past medical history of ESRD, heart failure, COPD, hypertension, T2DM, atrial fibrillation, coronary artery disease, and small cell lung cancer (sp radiation 2023), who presented with dyspnea. Her last HD was 48 hrs prior to admission, on Saturday.  Patient reported acute progressive and severe dyspnea, associated with chest tightness, prompting her to call EMS. She was found with 02 saturation in the 80s, she was placed on supplemental 02 per [place] and was transported to the ED, where she was placed on Bipap due to respiratory distress. On her initial physical examination her blood pressure was 122/45, HR 70, RR 24 and 02 saturation 100% on bipap, lungs with diffuse bilateral rales, with poor inspiratory effort, heart with S1 and S2 present and regular with no gallops, rubs or murmurs, abdomen with no distention and positive lower extremity edema,   Chest radiograph with cardiomegaly with bilateral hilar vascular congestion, bilateral central interstitial infiltrates, moderate left pleural effusion.   EKG 92 bpm, left axis deviation, left anterior fascicular block, sinus rhythm with PVC, no significant ST segment or T wave changes.   Nephrology was contacted for renal replacement therapy with ultrafiltration.   16:00 patient in the hemodialysis unit, continue with bipap, her systolic blood pressure is 80's Consulted Critical Care, patient may need vasopressors in order to perform renal replacement therapy with ultrafiltration,

## 2023-09-11 ENCOUNTER — Inpatient Hospital Stay (HOSPITAL_COMMUNITY): Payer: 59

## 2023-09-11 DIAGNOSIS — I482 Chronic atrial fibrillation, unspecified: Secondary | ICD-10-CM

## 2023-09-11 DIAGNOSIS — J811 Chronic pulmonary edema: Secondary | ICD-10-CM

## 2023-09-11 DIAGNOSIS — J9620 Acute and chronic respiratory failure, unspecified whether with hypoxia or hypercapnia: Secondary | ICD-10-CM

## 2023-09-11 DIAGNOSIS — I5023 Acute on chronic systolic (congestive) heart failure: Secondary | ICD-10-CM | POA: Diagnosis not present

## 2023-09-11 DIAGNOSIS — J90 Pleural effusion, not elsewhere classified: Secondary | ICD-10-CM

## 2023-09-11 LAB — BRAIN NATRIURETIC PEPTIDE: B Natriuretic Peptide: 2579.6 pg/mL — ABNORMAL HIGH (ref 0.0–100.0)

## 2023-09-11 LAB — PROCALCITONIN: Procalcitonin: 1.94 ng/mL

## 2023-09-11 LAB — GLUCOSE, CAPILLARY
Glucose-Capillary: 115 mg/dL — ABNORMAL HIGH (ref 70–99)
Glucose-Capillary: 164 mg/dL — ABNORMAL HIGH (ref 70–99)
Glucose-Capillary: 150 mg/dL — ABNORMAL HIGH (ref 70–99)
Glucose-Capillary: 126 mg/dL — ABNORMAL HIGH (ref 70–99)

## 2023-09-11 MED ORDER — HEPARIN SODIUM (PORCINE) 5000 UNIT/ML IJ SOLN
5000.0000 [IU] | Freq: Three times a day (TID) | INTRAMUSCULAR | Status: DC
  Administered 2023-09-11 – 2023-09-13 (×6): 5000 [IU] via SUBCUTANEOUS
  Filled 2023-09-11 (×5): qty 1

## 2023-09-11 MED ORDER — ATORVASTATIN CALCIUM 10 MG PO TABS
20.0000 mg | ORAL_TABLET | Freq: Every day | ORAL | Status: DC
  Administered 2023-09-11 – 2023-09-13 (×3): 20 mg via ORAL
  Filled 2023-09-11 (×3): qty 2

## 2023-09-11 MED ORDER — CHLORHEXIDINE GLUCONATE CLOTH 2 % EX PADS
6.0000 | MEDICATED_PAD | Freq: Every day | CUTANEOUS | Status: DC
  Administered 2023-09-12 – 2023-09-13 (×2): 6 via TOPICAL

## 2023-09-11 MED ORDER — AMOXICILLIN-POT CLAVULANATE 250-125 MG PO TABS
1.0000 | ORAL_TABLET | Freq: Every day | ORAL | Status: DC
  Administered 2023-09-11 – 2023-09-13 (×3): 1 via ORAL
  Filled 2023-09-11 (×4): qty 1

## 2023-09-11 NOTE — Progress Notes (Signed)
Pt receives out-pt HD at [facility] on TTS. Will assist as needed.   Veleta Yamamoto Renal Navigator [phone] 

## 2023-09-11 NOTE — Progress Notes (Addendum)
Patient Name: [name] [name] Date of Encounter: 09/11/2023 [facility] Cardiologist: Nanetta Batty, MD   Interval Summary  .    Feels much better today. Breathing has improved.   Vital Signs .    Vitals:   09/11/23 0053 09/11/23 0445 09/11/23 0549 09/11/23 0723  BP: (!) 121/38 (!) 96/43 (!) 104/42 (!) 117/48  Pulse: 84 83 82 90  Resp: 20 18 15 17   Temp: 97.6 F (36.4 C) (!) 97.4 F (36.3 C)  97.6 F (36.4 C)  TempSrc: Axillary Oral  Axillary  SpO2: 100% 96% 100% 93%  Weight: 100 kg     Height:        Intake/Output Summary (Last 24 hours) at 09/11/2023 1135 Last data filed at 09/11/2023 0900 Gross per 24 hour  Intake 480 ml  Output 2400 ml  Net -1920 ml      09/11/2023   12:53 AM 09/10/2023    8:11 PM 09/10/2023    6:45 PM  Last 3 Weights  Weight (lbs) 220 lb 7.4 oz 222 lb 7.1 oz --  Weight (kg) 100 kg 100.9 kg --      Telemetry/ECG    Sinus Rhythm, PVCs - Personally Reviewed  Physical Exam .    GEN: No acute distress.   Neck: No JVD Cardiac: RRR, no murmurs, rubs, or gallops.  Respiratory: Diminished on the left in base GI: Soft, nontender, non-distended  MS: No edema  Assessment & Plan .     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 -- Presented with increased shortness of breath at rest along with intermittent acute on chronic chest tightness, cough -- BNP 1611 (was 1345 on 08/20/2023) -- High sensitive troponin 153 >161 >178 >172 (previously 80-150) -- Chest x-ray revealing mild to moderate pulmonary vascular congestion, moderate to severe left basilar atelectasis/infiltrate, small to moderate left pleural effusion -- reports dietary indiscretion with thanksgiving -- underwent HD last evening and feels much better this morning  -- GDMT: Historically limited due to advanced renal disease, angioedema, intolerance to BiDil, refusal to amlodipine; continue PTA metoprolol 37.5 mg twice daily, could transition to  Toprol XL 75 mg at the time of discharge   Elevated troponin Chronic angina None obstructive CAD -- High sensitive troponin low flat trend, in the setting of ESRD, suspect demand ischemia -- Recent cath 06/2023 with nonobstructive CAD -- Continue medical therapy beta-blocker as above; she refuse statin. With the need for Eliquis, will stop plavix given no interventions -- Antianginal therapy is limited due to intolerance to nitrate, refusal of amlodipine, advanced renal disease limiting Ranexa   Paroxysmal A-fib -- Currently in sinus rhythm, continue PTA metoprolol 37.5 mg twice daily and Eliquis 5 mg twice daily -- She has stopped amiodarone, it was started 07/18/2023, completed a month, given underlying lung cancer and COPD, would avoid long-term use if able/no need to resume  -- would resume Eliquis if no plans for thoracentesis    Per Primary Leukocytosis Chronic hypoxic respiratory failure COPD ESRD on hemodialysis Type 2 diabetes Chronic anemia    For questions or updates, please contact [facility] Please consult [email or web address] for contact info under        Signed, [name] Page, NP   Agree with note by [name] Page NP-C  Patient well-known to me admitted with respiratory failure and volume overload.  She has a history of normal coronary arteries and moderate LV dysfunction as well as end-stage renal disease.  Other problems include type  2 diabetes, and COPD.  She initially was treated with BiPAP.  She underwent hemodialysis last night.  She feels clinically improved this morning.  She has a history of PAF but is maintaining sinus rhythm on Eliquis.  She does have a large left pleural effusion with no plans for thoracentesis.  This point, nothing further to add from a cardiac point of view.  Volume management per primary team and renal.  We will sign off but be available if further input is required.  We will arrange outpatient follow-up.  [name] [name],  M.D., FACP, [facility], [facility] [address] [place], [place]  [address]  [phone] 09/11/2023 12:32 PM

## 2023-09-11 NOTE — Plan of Care (Signed)

## 2023-09-11 NOTE — Progress Notes (Signed)
[facility] NEPHROLOGY PROGRESS NOTE  Assessment/ Plan: Pt is a 72 y.o. yo female  with ESRD on HD TTS at [place].  Past medical history significant for non obstructed CAD, chronic combined CHF, Afib, OSA, mild aortic stenosis, HTN, HLD, hyperthyroidism, DMT2, PVD, TIA, O2 dependent COPD, Hx non small cell lung cancer s/p radiation/in remission and obesity, presented with shortness of breath and hypoxia. Dialysis Orders:   [place] [facility]TS  4h   400/600   96.5kg  2/2 bath  LUA AVG  Heparin 2000 + 2000 intermittent - hectorol 3 mcg  - mircera 150 mcg IV q 2wks - last 11/30  # Respiratory distress with hypoxia - pulmonary edema with moderate L pleural effusion noted on CXR. Volume overloaded, not getting to dry weight at outpatient HD.  Required BiPAP and urgent HD on admission with improvement of breathing status.  Currently off of BiPAP.  Pulmonary team is following. #ESRD -  on HD TTS.  Received dialysis yesterday.  I will plan for next dialysis tomorrow.  Blood pressure is still soft.  Required albumin and midodrine for intradialytic hypotension. #Moderate L pleural effusion - May require thoracentesis, defer to pulmonary team. #Acute on chronic combined HF/Elevated troponin -  UF as tolerated. Cardiology following. #Leukocytosis - WBC 15. Afebrile. Work up per PMD.  #Hypertension  -blood pressure variable, currently soft.  Monitor BP. #Anemia of CKD - Hgb 10.5. ESA recently dosed.  #Secondary Hyperparathyroidism -  Ca elevated. Phos close to goal.  Continue binders when eating. Hold VDRA for now.  # Hyperkalemia: Managed with dialysis, low potassium diet. #Nutrition - Renal diet w/fluid restrictions.    Subjective: Seen and examined at bedside.  She is off of BiPAP.  She had dialysis yesterday with around 2.4 L UF.  Pulmonary team is following.  She is more alert and awake today as well. Objective Vital signs in last 24 hours: Vitals:   09/11/23 0445 09/11/23 0549 09/11/23  0723 09/11/23 1135  BP: (!) 96/43 (!) 104/42 (!) 117/48 (!) 134/54  Pulse: 83 82 90 90  Resp: 18 15 17 20   Temp: (!) 97.4 F (36.3 C)  97.6 F (36.4 C) 97.8 F (36.6 C)  TempSrc: Oral  Axillary Oral  SpO2: 96% 100% 93% 90%  Weight:      Height:       Weight change:   Intake/Output Summary (Last 24 hours) at 09/11/2023 1149 Last data filed at 09/11/2023 0900 Gross per 24 hour  Intake 480 ml  Output 2400 ml  Net -1920 ml       Labs: RENAL PANEL Recent Labs  Lab 09/09/23 2149 09/09/23 2152 09/10/23 0534 09/10/23 0540  NA 142 138 140 137  K 5.7* 5.6* 6.0* 5.8*  CL 98  --  98  --   CO2 25  --  22  --   GLUCOSE 198*  --  205*  --   BUN 105*  --  106*  --   CREATININE 8.09*  --  8.31*  --   CALCIUM 10.1  --  10.0  --   PHOS  --   --  5.7*  --   ALBUMIN 3.3*  --  3.3*  --     Liver Function Tests: Recent Labs  Lab 09/09/23 2149 09/10/23 0534  AST 27  --   ALT 17  --   ALKPHOS 102  --   BILITOT 0.6  --   PROT 6.7  --   ALBUMIN 3.3*  3.3*   No results for input(s): "LIPASE", "AMYLASE" in the last 168 hours. No results for input(s): "AMMONIA" in the last 168 hours. CBC: Recent Labs    08/30/23 0414 09/09/23 2149 09/09/23 2152 09/10/23 0534 09/10/23 0540  HGB 8.6* 9.2* 10.9* 9.1* 10.5*  MCV 106.1* 105.4*  --  107.7*  --     Cardiac Enzymes: No results for input(s): "CKTOTAL", "CKMB", "CKMBINDEX", "TROPONINI" in the last 168 hours. CBG: Recent Labs  Lab 09/10/23 2108 09/11/23 0633 09/11/23 1132  GLUCAP 129* 115* 164*    Iron Studies: No results for input(s): "IRON", "TIBC", "TRANSFERRIN", "FERRITIN" in the last 72 hours. Studies/Results: DG Chest Port 1 View  Result Date: 09/11/2023 CLINICAL DATA:  Shortness of breath.  Evaluate for pleural effusion. EXAM: PORTABLE CHEST 1 VIEW COMPARISON:  Radiographs 09/09/2023 and 08/24/2023.  CT 07/11/2023. FINDINGS: 0506 hours. Stable cardiomegaly and aortic atherosclerosis. Pulmonary vascular congestion and  edema appear mildly improved. There is persistent retrocardiac airspace disease and a probable small left pleural effusion. No evidence of significant right-sided pleural effusion or pneumothorax. The bones appear unchanged. Telemetry leads overlie the chest. IMPRESSION: Mildly improved pulmonary vascular congestion and edema. Persistent retrocardiac airspace disease and probable small left pleural effusion. Electronically Signed   By: Carey Bullocks M.D.   On: 09/11/2023 08:56   ECHOCARDIOGRAM LIMITED  Result Date: 09/10/2023    ECHOCARDIOGRAM LIMITED REPORT   Patient Name:   [name] [name] Date of Exam: 09/10/2023 Medical Rec #:  [id number]         Height:       63.0 in Accession #:    [id number]        Weight:       212.5 lb Date of Birth:  04-05-1951          BSA:          1.984 m Patient Age:    72 years          BP:           121/53 mmHg Patient Gender: F                 HR:           66 bpm. Exam Location:  Inpatient Procedure: Limited Echo, Cardiac Doppler, Color Doppler and Intracardiac            Opacification Agent Indications:    CHF Acute Systolic  History:        Patient has prior history of Echocardiogram examinations, most                 recent 04/15/2023. Risk Factors:Diabetes, Dyslipidemia,                 Hypertension, Sleep Apnea and Former Smoker.  Sonographer:    Karma Ganja Referring Phys: [id number] CAROLE N HALL  Sonographer Comments: Technically difficult study due to poor echo windows and patient is obese. Image acquisition challenging due to patient body habitus and Image acquisition challenging due to patient behavioral factors. IMPRESSIONS  1. No LV thrombus noted. Left ventricular ejection fraction, by estimation, is 20 to 25%. The left ventricle has severely decreased function. The left ventricle demonstrates global hypokinesis. The left ventricular internal cavity size was mildly dilated. There is mild concentric left ventricular hypertrophy.  2. Left atrial size was severely  dilated.  3. Moderate pleural effusion in the left lateral region.  4. The mitral valve was not well visualized. Trivial mitral valve regurgitation. No  evidence of mitral stenosis.  5. The aortic valve was not well visualized.  6. There is severely elevated pulmonary artery systolic pressure. The estimated right ventricular systolic pressure is 81.3 mmHg.  7. The inferior vena cava is dilated in size with <50% respiratory variability, suggesting right atrial pressure of 15 mmHg. Comparison(s): Unable to view comparison study, LVEF is worse that reported. FINDINGS  Left Ventricle: No LV thrombus noted. Left ventricular ejection fraction, by estimation, is 20 to 25%. The left ventricle has severely decreased function. The left ventricle demonstrates global hypokinesis. Definity contrast agent was given IV to delineate the left ventricular endocardial borders. The left ventricular internal cavity size was mildly dilated. There is mild concentric left ventricular hypertrophy. Right Ventricle: There is severely elevated pulmonary artery systolic pressure. The tricuspid regurgitant velocity is 4.07 m/s, and with an assumed right atrial pressure of 15 mmHg, the estimated right ventricular systolic pressure is 81.3 mmHg. Left Atrium: Left atrial size was severely dilated. Mitral Valve: The mitral valve was not well visualized. Trivial mitral valve regurgitation. No evidence of mitral valve stenosis. Aortic Valve: The aortic valve was not well visualized. Pulmonic Valve: The pulmonic valve was not well visualized. Pulmonic valve regurgitation is mild. No evidence of pulmonic stenosis. Venous: The inferior vena cava is dilated in size with less than 50% respiratory variability, suggesting right atrial pressure of 15 mmHg. Additional Comments: There is a moderate pleural effusion in the left lateral region. Spectral Doppler performed. Color Doppler performed.  LEFT VENTRICLE PLAX 2D LVIDd:         5.90 cm LVIDs:         5.20  cm LV PW:         1.30 cm LV IVS:        1.30 cm LVOT diam:     2.10 cm LVOT Area:     3.46 cm  LV Volumes (MOD) LV vol d, MOD A2C: 95.5 ml LV vol d, MOD A4C: 157.0 ml LV vol s, MOD A2C: 88.8 ml LV vol s, MOD A4C: 129.0 ml LV SV MOD A2C:     6.7 ml LV SV MOD A4C:     157.0 ml LV SV MOD BP:      12.0 ml IVC IVC diam: 2.50 cm LEFT ATRIUM              Index LA diam:        4.40 cm  2.22 cm/m LA Vol (A2C):   119.0 ml 59.97 ml/m LA Vol (A4C):   108.0 ml 54.43 ml/m LA Biplane Vol: 117.0 ml 58.97 ml/m  TRICUSPID VALVE TR Peak grad:   66.3 mmHg TR Vmax:        407.00 cm/s  SHUNTS Systemic Diam: 2.10 cm Riley Lam MD Electronically signed by Riley Lam MD Signature Date/Time: 09/10/2023/1:57:55 PM    Final    DG Chest Port 1 View  Result Date: 09/09/2023 CLINICAL DATA:  Shortness of breath. EXAM: PORTABLE CHEST 1 VIEW COMPARISON:  August 24, 2023 FINDINGS: The right-sided venous catheter seen on the prior study has been removed. There is stable moderate severity cardiac silhouette enlargement. Mild diffusely increased interstitial lung markings are seen with mild to moderate severity prominence of the central pulmonary vasculature. Moderate to marked severity left basilar atelectasis and/or infiltrate is noted. There is a small to moderate size left pleural effusion. No pneumothorax is identified. Multilevel degenerative changes seen throughout the thoracic spine. IMPRESSION: 1. Cardiomegaly with mild to moderate severity pulmonary  vascular congestion. 2. Moderate to marked severity left basilar atelectasis and/or infiltrate. 3. Small to moderate size left pleural effusion. Electronically Signed   By: Aram Candela M.D.   On: 09/09/2023 23:56    Medications: Infusions:   Scheduled Medications:  amoxicillin-clavulanate  1 tablet Oral q1800   arformoterol  15 mcg Nebulization Q12H   atorvastatin  20 mg Oral Daily   budesonide (PULMICORT) nebulizer solution  0.5 mg Nebulization BID    Chlorhexidine Gluconate Cloth  6 each Topical Q0600   collagenase   Topical Daily   heparin sodium (porcine)  2,000 Units Intracatheter Once   heparin sodium (porcine)  2,000 Units Intracatheter Once   insulin aspart  0-6 Units Subcutaneous TID WC   levothyroxine  175 mcg Oral Daily   midodrine  15 mg Oral TID WC   multivitamins with iron  1 tablet Oral q AM   revefenacin  175 mcg Nebulization Daily   sevelamer carbonate  1,600 mg Oral TID WC   thiamine  100 mg Oral q AM    have reviewed scheduled and prn medications.  Physical Exam: General:NAD, comfortable, able to lie flat, on oxygen nasal cannula Heart:RRR, s1s2 nl Lungs: Distant but sound, Abdomen:soft, Non-tender, non-distended Extremities:No edema Dialysis Access: Left upper extremity AV graft.  Nancey Kreitz Prasad Revella Shelton 09/11/2023,11:49 AM  LOS: 1 day

## 2023-09-11 NOTE — Progress Notes (Signed)
   09/11/23 2155  BiPAP/CPAP/SIPAP  BiPAP/CPAP/SIPAP Pt Type Adult  BiPAP/CPAP/SIPAP Resmed  Mask Type Full face mask  Mask Size Small  Respiratory Rate 18 breaths/min  IPAP 14 cmH20  EPAP 6 cmH2O  Flow Rate 3 lpm  Patient Home Equipment No  Auto Titrate No  BiPAP/CPAP /SiPAP Vitals  Pulse Rate 89  Resp 20  SpO2 97 %  MEWS Score/Color  MEWS Score 0  MEWS Score Color [name]

## 2023-09-11 NOTE — Progress Notes (Signed)
PROGRESS NOTE    [name] [name]  ATF:[id number] DOB: January 27, 1951 DOA: 09/09/2023 PCP: Vladimir Crofts, FNP  72/F with chronic systolic CHF, COPD on 2 to 3 L home O2, history of lung cancer sp XRT 2023, CAD, hypertension, type 2 diabetes mellitus, atrial fibrillation presented to the ED with progressive dyspnea on exertion.  Noted to be hypoxic and distress, transported to the ED placed on BiPAP. -Chest x-ray noted cardiomegaly, pulmonary vascular congestion, moderate left pleural effusion and CHF  -Nephrology consulted, dialyzed yesterday -Echo noted worsening cardiomyopathy, cardiology consulting  Subjective: -Feels weak, short of breath  Assessment and Plan:  Acute on chronic systolic CHF  Severe pulmonary hypertension -Echo noted worsening cardiomyopathy, EF down to 20-25% with severely elevated PA systolic pressures -Has not been noted to have worsening cardiomyopathy and EF over the last several months, LHC 9/24 with mild nonobstructive CAD -Admitted with volume overload despite compliance with dialysis  -GDMT limited by CKD 5  -Continue metoprolol  -volume removal with HD, still appears volume overloaded today  ESRD on dialysis (HCC) -Dialyzed yesterday, nephrology following  Acute hypoxemic respiratory failure due to acute pulmonary edema with left pleural effusion. Bronchitis/pneumonia suspected in addition -Also gives history of low-grade fevers, has leukocytosis and elevated procalcitonin, x-ray with retrocardiac opacity, will add 5 days of Augmentin -Volume removal with dialysis  CAD (coronary artery disease) No ACS Elevated troponin likely due to volume overload.    Paroxysmal atrial fibrillation (HCC) Continue anticoagulation with apixaban.  Hold on AV blockade for now.   DM (diabetes mellitus), type 2  -CBGs are stable, continue current regimen of sliding scale insulin  History of small cell lung cancer -Followed by Dr. Shirline Frees, treated with XRT 2023 -Due  for PET scan soon  Class 2 obesity due to excess calories with body mass index (BMI) of 36.0 to 36.9 in adult Calculated BMI is 37   Abnormal TSH -On Synthroid, TSH was normal 2 months ago, now 13.6, I wonder this if this is related to decreased absorption from CHF/bowel edema -Recommend repeating labs in 6 weeks  DVT prophylaxis: Add heparin subcutaneous Code Status: DNR Family Communication: None present Disposition Plan: Home likely 48 hours  Consultants:    Procedures:   Antimicrobials:    Objective: Vitals:   09/11/23 0445 09/11/23 0549 09/11/23 0723 09/11/23 1135  BP: (!) 96/43 (!) 104/42 (!) 117/48 (!) 134/54  Pulse: 83 82 90 90  Resp: 18 15 17 20   Temp: (!) 97.4 F (36.3 C)  97.6 F (36.4 C) 97.8 F (36.6 C)  TempSrc: Oral  Axillary Oral  SpO2: 96% 100% 93% 90%  Weight:      Height:        Intake/Output Summary (Last 24 hours) at 09/11/2023 1154 Last data filed at 09/11/2023 0900 Gross per 24 hour  Intake 480 ml  Output 2400 ml  Net -1920 ml   Filed Weights   09/10/23 1136 09/10/23 2011 09/11/23 0053  Weight: 96.4 kg 100.9 kg 100 kg    Examination:  General exam: Obese chronically ill female laying in bed, AAOx3 HEENT: Neck obese unable to assess JVD CVS: S1-S2, regular rhythm Lungs: Decreased breath sounds at the bases Abdomen: Soft, nontender, bowel sounds present Extremities: Trace edema  Psychiatry:  Mood & affect appropriate.     Data Reviewed:   CBC: Recent Labs  Lab 09/09/23 2149 09/09/23 2152 09/10/23 0534 09/10/23 0540  WBC 16.7*  --  15.0*  --   NEUTROABS 14.3*  --   --   --  HGB 9.2* 10.9* 9.1* 10.5*  HCT 31.1* 32.0* 32.2* 31.0*  MCV 105.4*  --  107.7*  --   PLT 239  --  226  --    Basic Metabolic Panel: Recent Labs  Lab 09/09/23 2149 09/09/23 2152 09/10/23 0534 09/10/23 0540  NA 142 138 140 137  K 5.7* 5.6* 6.0* 5.8*  CL 98  --  98  --   CO2 25  --  22  --   GLUCOSE 198*  --  205*  --   BUN 105*  --  106*  --    CREATININE 8.09*  --  8.31*  --   CALCIUM 10.1  --  10.0  --   PHOS  --   --  5.7*  --    GFR: Estimated Creatinine Clearance: 6.9 mL/min (A) (by C-G formula based on SCr of 8.31 mg/dL (H)). Liver Function Tests: Recent Labs  Lab 09/09/23 2149 09/10/23 0534  AST 27  --   ALT 17  --   ALKPHOS 102  --   BILITOT 0.6  --   PROT 6.7  --   ALBUMIN 3.3* 3.3*   No results for input(s): "LIPASE", "AMYLASE" in the last 168 hours. No results for input(s): "AMMONIA" in the last 168 hours. Coagulation Profile: No results for input(s): "INR", "PROTIME" in the last 168 hours. Cardiac Enzymes: No results for input(s): "CKTOTAL", "CKMB", "CKMBINDEX", "TROPONINI" in the last 168 hours. BNP (last 3 results) No results for input(s): "PROBNP" in the last 8760 hours. HbA1C: No results for input(s): "HGBA1C" in the last 72 hours. CBG: Recent Labs  Lab 09/10/23 2108 09/11/23 0633 09/11/23 1132  GLUCAP 129* 115* 164*   Lipid Profile: No results for input(s): "CHOL", "HDL", "LDLCALC", "TRIG", "CHOLHDL", "LDLDIRECT" in the last 72 hours. Thyroid Function Tests: Recent Labs    09/10/23 2037  TSH 13.623*   Anemia Panel: No results for input(s): "VITAMINB12", "FOLATE", "FERRITIN", "TIBC", "IRON", "RETICCTPCT" in the last 72 hours. Urine analysis:    Component Value Date/Time   COLORURINE YELLOW 11/27/2021 0956   APPEARANCEUR HAZY (A) 11/27/2021 0956   LABSPEC 1.012 11/27/2021 0956   PHURINE 5.0 11/27/2021 0956   GLUCOSEU NEGATIVE 11/27/2021 0956   HGBUR NEGATIVE 11/27/2021 0956   BILIRUBINUR NEGATIVE 11/27/2021 0956   KETONESUR NEGATIVE 11/27/2021 0956   PROTEINUR 30 (A) 11/27/2021 0956   UROBILINOGEN 0.2 08/04/2014 1409   NITRITE NEGATIVE 11/27/2021 0956   LEUKOCYTESUR NEGATIVE 11/27/2021 0956   Sepsis Labs: @LABRCNTIP (procalcitonin:4,lacticidven:4)  )No results found for this or any previous visit (from the past 240 hour(s)).   Radiology Studies: DG Chest Port 1  View  Result Date: 09/11/2023 CLINICAL DATA:  Shortness of breath.  Evaluate for pleural effusion. EXAM: PORTABLE CHEST 1 VIEW COMPARISON:  Radiographs 09/09/2023 and 08/24/2023.  CT 07/11/2023. FINDINGS: 0506 hours. Stable cardiomegaly and aortic atherosclerosis. Pulmonary vascular congestion and edema appear mildly improved. There is persistent retrocardiac airspace disease and a probable small left pleural effusion. No evidence of significant right-sided pleural effusion or pneumothorax. The bones appear unchanged. Telemetry leads overlie the chest. IMPRESSION: Mildly improved pulmonary vascular congestion and edema. Persistent retrocardiac airspace disease and probable small left pleural effusion. Electronically Signed   By: Carey Bullocks M.D.   On: 09/11/2023 08:56   ECHOCARDIOGRAM LIMITED  Result Date: 09/10/2023    ECHOCARDIOGRAM LIMITED REPORT   Patient Name:   MAARIA CONNERS Date of Exam: 09/10/2023 Medical Rec #:  [id number]  Height:       63.0 in Accession #:    [id number]        Weight:       212.5 lb Date of Birth:  10-28-1950          BSA:          1.984 m Patient Age:    72 years          BP:           121/53 mmHg Patient Gender: F                 HR:           66 bpm. Exam Location:  Inpatient Procedure: Limited Echo, Cardiac Doppler, Color Doppler and Intracardiac            Opacification Agent Indications:    CHF Acute Systolic  History:        Patient has prior history of Echocardiogram examinations, most                 recent 04/15/2023. Risk Factors:Diabetes, Dyslipidemia,                 Hypertension, Sleep Apnea and Former Smoker.  Sonographer:    Karma Ganja Referring Phys: [id number] CAROLE N HALL  Sonographer Comments: Technically difficult study due to poor echo windows and patient is obese. Image acquisition challenging due to patient body habitus and Image acquisition challenging due to patient behavioral factors. IMPRESSIONS  1. No LV thrombus noted. Left ventricular ejection  fraction, by estimation, is 20 to 25%. The left ventricle has severely decreased function. The left ventricle demonstrates global hypokinesis. The left ventricular internal cavity size was mildly dilated. There is mild concentric left ventricular hypertrophy.  2. Left atrial size was severely dilated.  3. Moderate pleural effusion in the left lateral region.  4. The mitral valve was not well visualized. Trivial mitral valve regurgitation. No evidence of mitral stenosis.  5. The aortic valve was not well visualized.  6. There is severely elevated pulmonary artery systolic pressure. The estimated right ventricular systolic pressure is 81.3 mmHg.  7. The inferior vena cava is dilated in size with <50% respiratory variability, suggesting right atrial pressure of 15 mmHg. Comparison(s): Unable to view comparison study, LVEF is worse that reported. FINDINGS  Left Ventricle: No LV thrombus noted. Left ventricular ejection fraction, by estimation, is 20 to 25%. The left ventricle has severely decreased function. The left ventricle demonstrates global hypokinesis. Definity contrast agent was given IV to delineate the left ventricular endocardial borders. The left ventricular internal cavity size was mildly dilated. There is mild concentric left ventricular hypertrophy. Right Ventricle: There is severely elevated pulmonary artery systolic pressure. The tricuspid regurgitant velocity is 4.07 m/s, and with an assumed right atrial pressure of 15 mmHg, the estimated right ventricular systolic pressure is 81.3 mmHg. Left Atrium: Left atrial size was severely dilated. Mitral Valve: The mitral valve was not well visualized. Trivial mitral valve regurgitation. No evidence of mitral valve stenosis. Aortic Valve: The aortic valve was not well visualized. Pulmonic Valve: The pulmonic valve was not well visualized. Pulmonic valve regurgitation is mild. No evidence of pulmonic stenosis. Venous: The inferior vena cava is dilated in size  with less than 50% respiratory variability, suggesting right atrial pressure of 15 mmHg. Additional Comments: There is a moderate pleural effusion in the left lateral region. Spectral Doppler performed. Color Doppler performed.  LEFT VENTRICLE PLAX 2D LVIDd:  5.90 cm LVIDs:         5.20 cm LV PW:         1.30 cm LV IVS:        1.30 cm LVOT diam:     2.10 cm LVOT Area:     3.46 cm  LV Volumes (MOD) LV vol d, MOD A2C: 95.5 ml LV vol d, MOD A4C: 157.0 ml LV vol s, MOD A2C: 88.8 ml LV vol s, MOD A4C: 129.0 ml LV SV MOD A2C:     6.7 ml LV SV MOD A4C:     157.0 ml LV SV MOD BP:      12.0 ml IVC IVC diam: 2.50 cm LEFT ATRIUM              Index LA diam:        4.40 cm  2.22 cm/m LA Vol (A2C):   119.0 ml 59.97 ml/m LA Vol (A4C):   108.0 ml 54.43 ml/m LA Biplane Vol: 117.0 ml 58.97 ml/m  TRICUSPID VALVE TR Peak grad:   66.3 mmHg TR Vmax:        407.00 cm/s  SHUNTS Systemic Diam: 2.10 cm Riley Lam MD Electronically signed by Riley Lam MD Signature Date/Time: 09/10/2023/1:57:55 PM    Final    DG Chest Port 1 View  Result Date: 09/09/2023 CLINICAL DATA:  Shortness of breath. EXAM: PORTABLE CHEST 1 VIEW COMPARISON:  August 24, 2023 FINDINGS: The right-sided venous catheter seen on the prior study has been removed. There is stable moderate severity cardiac silhouette enlargement. Mild diffusely increased interstitial lung markings are seen with mild to moderate severity prominence of the central pulmonary vasculature. Moderate to marked severity left basilar atelectasis and/or infiltrate is noted. There is a small to moderate size left pleural effusion. No pneumothorax is identified. Multilevel degenerative changes seen throughout the thoracic spine. IMPRESSION: 1. Cardiomegaly with mild to moderate severity pulmonary vascular congestion. 2. Moderate to marked severity left basilar atelectasis and/or infiltrate. 3. Small to moderate size left pleural effusion. Electronically Signed   By:  Aram Candela M.D.   On: 09/09/2023 23:56     Scheduled Meds:  amoxicillin-clavulanate  1 tablet Oral q1800   arformoterol  15 mcg Nebulization Q12H   atorvastatin  20 mg Oral Daily   budesonide (PULMICORT) nebulizer solution  0.5 mg Nebulization BID   Chlorhexidine Gluconate Cloth  6 each Topical Q0600   collagenase   Topical Daily   heparin sodium (porcine)  2,000 Units Intracatheter Once   heparin sodium (porcine)  2,000 Units Intracatheter Once   insulin aspart  0-6 Units Subcutaneous TID WC   levothyroxine  175 mcg Oral Daily   midodrine  15 mg Oral TID WC   multivitamins with iron  1 tablet Oral q AM   revefenacin  175 mcg Nebulization Daily   sevelamer carbonate  1,600 mg Oral TID WC   thiamine  100 mg Oral q AM   Continuous Infusions:   LOS: 1 day    Time spent:    Zannie Cove, MD [facility]   09/11/2023, 11:54 AM

## 2023-09-11 NOTE — Evaluation (Signed)
Physical Therapy Evaluation Patient Details Name: [name] [name] MRN: [id number] DOB: [birth year] Today's Date: 09/11/2023  History of Present Illness  Pt is a 72 y.o. female presenting 09/09/2023 with shortness of breath. Recent admission 11/24 with respiratory failure. 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   Clinical Impression  [name] [name] is 72 y.o. female admitted with above HPI and diagnosis. Patient is currently limited by functional impairments below (see PT problem list). Patient lives with son and is dependent with hoyer lift transfers bed<>WC at baseline. Pt does participate in seated toileting on BSC at home and bathing in shower on tub bench. Pt currently requires max/total assist+2 for supine<>sit and is able to maintain seated balance with bil feet support. Patient will benefit from continued skilled PT interventions to address impairments and progress independence. Patient will benefit from continued inpatient follow up therapy, <3 hours/day. Acute PT will follow and progress as able.         If plan is discharge home, recommend the following: Two people to help with walking and/or transfers;A lot of help with bathing/dressing/bathroom;Assistance with cooking/housework;Assist for transportation;Help with stairs or ramp for entrance;Supervision due to cognitive status   Can travel by private vehicle   No    Equipment Recommendations None recommended by PT  Recommendations for Other Services       Functional Status Assessment Patient has had a recent decline in their functional status and demonstrates the ability to make significant improvements in function in a reasonable and predictable amount of time.     Precautions /  Restrictions Precautions Precautions: Fall Precaution Comments: watch sats and bp; hx of gout in feet Lt>Rt Restrictions Weight Bearing Restrictions: No      Mobility  Bed Mobility Overal bed mobility: Needs Assistance Bed Mobility: Supine to Sit, Sit to Supine     Supine to sit: Max assist, +2 for safety/equipment, +2 for physical assistance, HOB elevated, Used rails Sit to supine: Max assist, +2 for physical assistance, +2 for safety/equipment, Used rails   General bed mobility comments: pt initiating reach for bed rail and bring LE's off EOB. ultimately pt required max+2 assist with use of bed pad to pivot hips, and raise trunk upright. Pt able to maintain balance at EOB. Max+2/Total assist to return to supine and reposition in bed.    Transfers                   General transfer comment: deferred    Ambulation/Gait                  Stairs            Wheelchair Mobility     Tilt Bed    Modified Rankin (Stroke Patients Only)       Balance Overall balance assessment: Needs assistance Sitting-balance support: Bilateral upper extremity supported, Feet supported Sitting balance-Leahy Scale: Fair Sitting balance - Comments: sitting EOB                                     Pertinent Vitals/Pain Pain Assessment Pain Assessment: Faces Faces Pain Scale: Hurts a little bit Pain  Location: Lt ankle with ROM/MMT testing Pain Descriptors / Indicators: Discomfort, Grimacing Pain Intervention(s): Limited activity within patient's tolerance, Monitored during session, Repositioned    Home Living Family/patient expects to be discharged to:: Private residence Living Arrangements: Children (son) Available Help at Discharge: Family;Available PRN/intermittently;Personal care attendant (son s/p back surgery and home 24/7) Type of Home: Apartment Home Access: Ramped entrance       Home Layout: One level Home Equipment: Tub bench;Grab bars -  tub/shower;Grab bars - toilet;Rolling Walker (2 wheels);BSC/3in1;Hospital bed;Wheelchair - Engineer, technical sales - power;Other (comment) (hoyer lift)      Prior Function Prior Level of Function : Needs assist       Physical Assist : Mobility (physical);ADLs (physical) Mobility (physical): Bed mobility;Transfers;Gait ADLs (physical): Bathing;Dressing;Toileting;IADLs Mobility Comments: hoyer for OOB, son assists with rolling ADLs Comments: PCA helps with ADLs and IADLs. PCA 7 days/week Sun, MWF 3 hours. 2 hours T,R, Sat. aid uses hoyer to shower bench 1-2x/week     Extremity/Trunk Assessment   Upper Extremity Assessment Upper Extremity Assessment: Defer to OT evaluation    Lower Extremity Assessment Lower Extremity Assessment: RLE deficits/detail;LLE deficits/detail RLE Deficits / Details: knee ext 2/5, knee buckles with resistance, lots of crepitus. 3/ knee flex, 2+/5 DF. 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    Cervical / Trunk Assessment Cervical / Trunk Assessment: Kyphotic  Communication   Communication Communication: No apparent difficulties  Cognition Arousal: Alert Behavior During Therapy: WFL for tasks assessed/performed Overall Cognitive Status: No family/caregiver present to determine baseline cognitive functioning                                          General Comments      Exercises     Assessment/Plan    PT Assessment Patient needs continued PT services  PT Problem List Decreased strength;Decreased mobility;Decreased activity tolerance;Decreased balance;Decreased knowledge of use of DME;Obesity       PT Treatment Interventions DME instruction;Gait training;Functional mobility training;Therapeutic activities;Patient/family education;Balance training;Therapeutic exercise    PT Goals (Current goals can be found in the Care Plan section)   Acute Rehab PT Goals Patient Stated Goal: return home PT Goal Formulation: With patient Time For Goal Achievement: 09/25/23 Potential to Achieve Goals: Fair    Frequency Min 1X/week     Co-evaluation PT/OT/SLP Co-Evaluation/Treatment: Yes Reason for Co-Treatment: For patient/therapist safety;To address functional/ADL transfers PT goals addressed during session: Mobility/safety with mobility;Balance OT goals addressed during session: ADL's and self-care;Strengthening/ROM       AM-PAC PT "6 Clicks" Mobility  Outcome Measure Help needed turning from your back to your side while in a flat bed without using bedrails?: A Lot Help needed moving from lying on your back to sitting on the side of a flat bed without using bedrails?: A Lot Help needed moving to and from a bed to a chair (including a wheelchair)?: Total Help needed standing up from a chair using your arms (e.g., wheelchair or bedside chair)?: Total Help needed to walk in hospital room?: Total Help needed climbing 3-5 steps with a railing? : Total 6 Click Score: 8    End of Session Equipment Utilized During Treatment: Oxygen Activity Tolerance: Patient tolerated treatment well Patient left: in bed;with call bell/phone within reach;with bed alarm set Nurse Communication: Mobility status PT Visit  Diagnosis: Other abnormalities of gait and mobility (R26.89);Muscle weakness (generalized) (M62.81)    Time: 1610-9604 PT Time Calculation (min) (ACUTE ONLY): 25 min   Charges:   PT Evaluation $PT Eval Moderate Complexity: 1 Mod   PT General Charges $$ ACUTE PT VISIT: 1 Visit         Wynn Maudlin, DPT Acute Rehabilitation Services Office [phone]  09/11/23 1:32 PM

## 2023-09-11 NOTE — Evaluation (Signed)
Occupational Therapy Evaluation Patient Details Name: [name] [name] MRN: [id number] DOB: 1951-04-05 Today's Date: 09/11/2023   History of Present Illness Pt is a 72 y.o. female presenting 09/09/2023 with shortness of breath. Recent admission 11/24 with respiratory failure. 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   Clinical Impression   PTA, pt at home with son and PCA 7 days /week for 2-3 hours. Using hoyer at baseline for transfers to chair/shower/BSC. Pt currently requiring max-total A for bed mobility and is able to sit EOB with CGA for safety. Pt hopeful to progress mobility and self care. Will continue to follow. Patient will benefit from continued inpatient follow up therapy, <3 hours/day        If plan is discharge home, recommend the following: Two people to help with walking and/or transfers;A lot of help with bathing/dressing/bathroom;Assistance with cooking/housework;Help with stairs or ramp for entrance;Assist for transportation    Functional Status Assessment  Patient has had a recent decline in their functional status and demonstrates the ability to make significant improvements in function in a reasonable and predictable amount of time.  Equipment Recommendations  None recommended by OT    Recommendations for Other Services       Precautions / Restrictions Precautions Precautions: Fall Precaution Comments: watch sats and bp; hx of gout in feet Lt>Rt Restrictions Weight Bearing Restrictions: No      Mobility Bed Mobility Overal bed mobility: Needs Assistance Bed Mobility: Supine to Sit, Sit to Supine     Supine to sit: Max assist, +2 for safety/equipment, +2 for physical assistance, HOB elevated, Used rails Sit to  supine: Max assist, +2 for physical assistance, +2 for safety/equipment, Used rails   General bed mobility comments: pt initiating reach for bed rail and bring LE's off EOB. ultimately pt required max+2 assist with use of bed pad to pivot hips, and raise trunk upright. Pt able to maintain balance at EOB. Max+2/Total assist to return to supine and reposition in bed.    Transfers                   General transfer comment: deferred      Balance Overall balance assessment: Needs assistance Sitting-balance support: Bilateral upper extremity supported, Feet supported Sitting balance-Leahy Scale: Fair Sitting balance - Comments: sitting EOB                                   ADL either performed or assessed with clinical judgement   ADL Overall ADL's : Needs assistance/impaired Eating/Feeding: Independent;Bed level   Grooming: Set up;Sitting   Upper Body Bathing: Sitting;Contact guard assist   Lower Body Bathing: Sitting/lateral leans;Maximal assistance   Upper Body Dressing : Minimal assistance;Sitting   Lower Body Dressing: Sitting/lateral leans;Maximal assistance     Toilet Transfer Details (indicate cue type and reason): not assessed; hoyer at baseline                 Vision         Perception         Praxis         Pertinent Vitals/Pain Pain Assessment Pain Assessment: Faces Faces Pain  Scale: Hurts a little bit Pain Location: Lt ankle with ROM/MMT testing Pain Descriptors / Indicators: Discomfort, Grimacing Pain Intervention(s): Limited activity within patient's tolerance, Monitored during session     Extremity/Trunk Assessment Upper Extremity Assessment Upper Extremity Assessment: Generalized weakness (Not formally assessed but per chart chronic R shoulder impairment with 45 degrees ROM at shoulder)   Lower Extremity Assessment Lower Extremity Assessment: Defer to PT evaluation RLE Deficits / Details: knee ext 2/5, knee buckles  with resistance, lots of crepitus. 3/ knee flex, 2+/5 DF. 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   Cervical / Trunk Assessment Cervical / Trunk Assessment: Kyphotic   Communication Communication Communication: No apparent difficulties   Cognition Arousal: Alert Behavior During Therapy: WFL for tasks assessed/performed Overall Cognitive Status: No family/caregiver present to determine baseline cognitive functioning                                 General Comments: Pt following all commands with increased time. Able to provide history consistent with previous admission other than having caregiver more days/week but able to identify she also had a change in caregiver as well     General Comments  VSS on 3L    Exercises     Shoulder Instructions      Home Living Family/patient expects to be discharged to:: Private residence Living Arrangements: Children (son) Available Help at Discharge: Family;Available PRN/intermittently;Personal care attendant (son s/p back surgery and home 24/7) Type of Home: Apartment Home Access: Ramped entrance     Home Layout: One level     Bathroom Shower/Tub: Producer, television/film/video: Handicapped height Bathroom Accessibility: Yes (hoyer lift with PCA 1x/week)   Home Equipment: Tub bench;Grab bars - tub/shower;Grab bars - toilet;Rolling Walker (2 wheels);BSC/3in1;Hospital bed;Wheelchair - Engineer, technical sales - power;Other (comment) (hoyer lift)          Prior Functioning/Environment Prior Level of Function : Needs assist       Physical Assist : Mobility (physical);ADLs (physical) Mobility (physical): Bed mobility;Transfers;Gait ADLs (physical): Bathing;Dressing;Toileting;IADLs Mobility Comments: hoyer for OOB, son assists with rolling ADLs Comments: PCA helps with ADLs and IADLs. PCA 7 days/week Sun, MWF 3 hours. 2  hours T,R, Sat. aid uses hoyer to shower bench 1-2x/week        OT Problem List: Decreased strength;Impaired balance (sitting and/or standing);Obesity;Cardiopulmonary status limiting activity      OT Treatment/Interventions: Self-care/ADL training;Therapeutic exercise;Therapeutic activities;Patient/family education;Balance training    OT Goals(Current goals can be found in the care plan section) Acute Rehab OT Goals Patient Stated Goal: get home OT Goal Formulation: With patient Time For Goal Achievement: 09/25/23 Potential to Achieve Goals: Fair  OT Frequency: Min 1X/week    Co-evaluation PT/OT/SLP Co-Evaluation/Treatment: Yes Reason for Co-Treatment: For patient/therapist safety;To address functional/ADL transfers PT goals addressed during session: Mobility/safety with mobility;Balance OT goals addressed during session: ADL's and self-care;Strengthening/ROM      AM-PAC OT "6 Clicks" Daily Activity     Outcome Measure Help from another person eating meals?: None Help from another person taking care of personal grooming?: A Little Help from another person toileting, which includes using toliet, bedpan, or urinal?: Total Help from another person bathing (including washing, rinsing, drying)?: A Lot Help from another person to put on and taking off regular upper body clothing?: A Lot Help from another person  to put on and taking off regular lower body clothing?: Total 6 Click Score: 13   End of Session Equipment Utilized During Treatment: Oxygen Nurse Communication: Mobility status  Activity Tolerance: Patient tolerated treatment well Patient left: in bed;with call bell/phone within reach;with bed alarm set  OT Visit Diagnosis: Muscle weakness (generalized) (M62.81);Pain                Time: 3086-5784 OT Time Calculation (min): 28 min Charges:  OT General Charges $OT Visit: 1 Visit OT Evaluation $OT Eval Moderate Complexity: 1 Mod  Tyler Deis, OTR/L [facility] Acute  Rehabilitation Office: [phone]   Myrla Halsted 09/11/2023, 1:46 PM

## 2023-09-11 NOTE — Evaluation (Signed)
Clinical/Bedside Swallow Evaluation Patient Details  Name: [name] [name] MRN: [id number] Date of Birth: 09-Apr-1951  Today's Date: 09/11/2023 Time: SLP Start Time (ACUTE ONLY): 1032 SLP Stop Time (ACUTE ONLY): 1046 SLP Time Calculation (min) (ACUTE ONLY): 14 min  Past Medical History:  Past Medical History:  Diagnosis Date   Anemia    Arthritis    Asthma    Chronic diastolic (congestive) heart failure (HCC)    Chronic kidney disease    Dialysis T-TH-Sat   Chronic pain syndrome    Class 2 obesity due to excess calories with body mass index (BMI) of 36.0 to 36.9 in adult    COPD (chronic obstructive pulmonary disease) (HCC)    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   Full dentures    GERD (gastroesophageal reflux disease)    Gout    History of hiatal hernia    History of transfusion    Hx MRSA infection    abscess left groin   Hyperlipidemia    Hypertension    Hypothyroid    Intractable nausea and vomiting 02/15/2021   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HCC)    Peripheral vascular disease (HCC)    Sleep apnea    UNABLE TO TOLERATE C PAP   Stress incontinence    TIA (transient ischemic attack)     X2 NO RESIDUAL PROBLEMS   Wears glasses    Past Surgical History:  Past Surgical History:  Procedure Laterality Date   ABDOMINAL HYSTERECTOMY     AV FISTULA PLACEMENT Left 12/01/2021   Procedure: INSERTION OF LEFT ARM ARTERIOVENOUS (AV) GORE-TEX GRAFT;  Surgeon: Nada Libman, MD;  Location: [facility] OR;  Service: Vascular;  Laterality: Left;   BACK SURGERY  2020   BASCILIC VEIN TRANSPOSITION Left 04/05/2020   Procedure: BASILIC VEIN TRANSPOSITION FIRST STAGE;  Surgeon: Chuck Hint, MD;  Location: [facility] OR;  Service: Vascular;  Laterality: Left;   BIOPSY  08/28/2023   Procedure: BIOPSY;  Surgeon: Kerin Salen, MD;  Location: [facility] ENDOSCOPY;  Service: Gastroenterology;;   BOTOX INJECTION  08/28/2023   Procedure: BOTOX INJECTION;  Surgeon: Kerin Salen, MD;  Location: [facility] ENDOSCOPY;  Service: Gastroenterology;;   BRONCHIAL BIOPSY  01/23/2022   Procedure: BRONCHIAL BIOPSIES;  Surgeon: Josephine Igo, DO;  Location: [facility] ENDOSCOPY;  Service: Pulmonary;;   BRONCHIAL BIOPSY  08/07/2022   Procedure: BRONCHIAL BIOPSIES;  Surgeon: Josephine Igo, DO;  Location: [facility] ENDOSCOPY;  Service: Pulmonary;;   BRONCHIAL BRUSHINGS  08/07/2022   Procedure: BRONCHIAL BRUSHINGS;  Surgeon: Josephine Igo, DO;  Location: [facility] ENDOSCOPY;  Service: Pulmonary;;   BRONCHIAL NEEDLE ASPIRATION BIOPSY  01/23/2022   Procedure: BRONCHIAL NEEDLE ASPIRATION BIOPSIES;  Surgeon: Josephine Igo, DO;  Location: [facility] ENDOSCOPY;  Service: Pulmonary;;   BRONCHIAL NEEDLE ASPIRATION BIOPSY  08/07/2022   Procedure: BRONCHIAL NEEDLE ASPIRATION BIOPSIES;  Surgeon: Josephine Igo, DO;  Location: [facility] ENDOSCOPY;  Service: Pulmonary;;   COLON SURGERY  1995   DUE TO POLYP   DILATION AND CURETTAGE OF UTERUS     ENDOBRONCHIAL ULTRASOUND  01/23/2022   Procedure: ENDOBRONCHIAL ULTRASOUND;  Surgeon: Josephine Igo, DO;  Location: [facility] ENDOSCOPY;  Service: Pulmonary;;   ENTEROSCOPY N/A 07/10/2021   Procedure: ENTEROSCOPY;  Surgeon: Kerin Salen, MD;  Location: [facility] ENDOSCOPY;  Service: Gastroenterology;  Laterality: N/A;   ESOPHAGEAL DILATION  08/28/2023   Procedure: ESOPHAGEAL BALLOON  DILATION;  Surgeon: Kerin Salen, MD;  Location: [facility] ENDOSCOPY;  Service: Gastroenterology;;  ESOPHAGOGASTRODUODENOSCOPY N/A 01/25/2021   Procedure: ESOPHAGOGASTRODUODENOSCOPY (EGD);  Surgeon: Vida Rigger, MD;  Location: [facility] ENDOSCOPY;  Service: Endoscopy;  Laterality: N/A;   ESOPHAGOGASTRODUODENOSCOPY (EGD) WITH PROPOFOL N/A 08/28/2023   Procedure: ESOPHAGOGASTRODUODENOSCOPY (EGD) WITH PROPOFOL;  Surgeon: Kerin Salen, MD;  Location: [facility] ENDOSCOPY;  Service: Gastroenterology;  Laterality: N/A;   FINE NEEDLE ASPIRATION  01/23/2022   Procedure: FINE NEEDLE ASPIRATION (FNA) LINEAR;  Surgeon: Josephine Igo, DO;   Location: [facility] ENDOSCOPY;  Service: Pulmonary;;   FOREARM FRACTURE SURGERY     Left arm   GIVENS CAPSULE STUDY N/A 07/07/2021   Procedure: GIVENS CAPSULE STUDY;  Surgeon: Vida Rigger, MD;  Location: [facility] ENDOSCOPY;  Service: Endoscopy;  Laterality: N/A;   HERNIA REPAIR     w/ mesh   HOT HEMOSTASIS N/A 07/10/2021   Procedure: HOT HEMOSTASIS (ARGON PLASMA COAGULATION/BICAP);  Surgeon: Kerin Salen, MD;  Location: [facility] ENDOSCOPY;  Service: Gastroenterology;  Laterality: N/A;   INCISION AND DRAINAGE ABSCESS Left 02/04/2013   Procedure: INCISION AND DRAINAGE LEFT BUTTOCK ABSCESS; INCISION AND DRAINAGE LEFT BREAST ABSCESS;  Surgeon: Shelly Rubenstein, MD;  Location: [facility] OR;  Service: General;  Laterality: Left;   INCISION AND DRAINAGE ABSCESS N/A 02/12/2013   Procedure: INCISION AND DEBRIDEMENT BUTTOCK WOUND ;  Surgeon: Wilmon Arms. Corliss Skains, MD;  Location: [facility] OR;  Service: General;  Laterality: N/A;   INCISION AND DRAINAGE ABSCESS N/A 02/14/2013   Procedure: INCISION AND DRAINAGE/DRESSING CHANGE;  Surgeon: Shelly Rubenstein, MD;  Location: [facility] OR;  Service: General;  Laterality: N/A;   INCISION AND DRAINAGE ABSCESS N/A 03/21/2015   Procedure: INCISION AND DRAINAGE PUBIC ABSCESS;  Surgeon: Glenna Fellows, MD;  Location: [facility] ORS;  Service: General;  Laterality: N/A;   INCISION AND DRAINAGE PERIRECTAL ABSCESS Left 02/10/2013   Procedure: IRRIGATION AND DEBRIDEMENT OF BUTTOCK/PERINEAL ABSCESS;  Surgeon: Wilmon Arms. Corliss Skains, MD;  Location: [facility] OR;  Service: General;  Laterality: Left;   INCISION AND DRAINAGE PERIRECTAL ABSCESS N/A 02/16/2013   Procedure: IRRIGATION AND DEBRIDEMENT PERINEAL ABSCESS;  Surgeon: Liz Malady, MD;  Location: [facility] OR;  Service: General;  Laterality: N/A;   IR FLUORO GUIDE CV LINE RIGHT  11/30/2021   IR FLUORO GUIDE CV LINE RIGHT  08/22/2023   IR REMOVAL TUN CV CATH W/O FL  08/29/2023   IR US GUIDE VASC ACCESS RIGHT  11/30/2021   IR US GUIDE VASC ACCESS RIGHT  08/22/2023   IRRIGATION AND  DEBRIDEMENT ABSCESS N/A 02/06/2013   Procedure: IRRIGATION AND DEBRIDEMENT BUTTOCK ABSCESS AND DRESSING CHANGE;  Surgeon: Shelly Rubenstein, MD;  Location: [facility] OR;  Service: General;  Laterality: N/A;   IRRIGATION AND DEBRIDEMENT ABSCESS Left 02/08/2013   Procedure: IRRIGATION AND DEBRIDEMENT ABSCESS/DRESSING CHANGE;  Surgeon: Cherylynn Ridges, MD;  Location: [facility] OR;  Service: General;  Laterality: Left;   JOINT REPLACEMENT  2010 / 2012    LAPAROSCOPIC CHOLECYSTECTOMY     LEFT HEART CATH AND CORONARY ANGIOGRAPHY N/A 07/05/2023   Procedure: LEFT HEART CATH AND CORONARY ANGIOGRAPHY;  Surgeon: Lennette Bihari, MD;  Location: [facility] INVASIVE CV LAB;  Service: Cardiovascular;  Laterality: N/A;   LEFT HEART CATHETERIZATION WITH CORONARY ANGIOGRAM N/A 07/02/2014   Procedure: LEFT HEART CATHETERIZATION WITH CORONARY ANGIOGRAM;  Surgeon: Runell Gess, MD;  Location: [facility] CATH LAB;  Service: Cardiovascular;  Laterality: N/A;   LOWER EXTREMITY ANGIOGRAM Right 04/20/2016   Procedure: Lower Extremity Angiogram;  Surgeon: Sherren Kerns, MD;  Location: [facility] INVASIVE CV LAB;  Service: Cardiovascular;  Laterality: Right;  MULTIPLE TOOTH EXTRACTIONS     PERIPHERAL VASCULAR CATHETERIZATION N/A 04/20/2016   Procedure: Abdominal Aortogram;  Surgeon: Sherren Kerns, MD;  Location: [facility] INVASIVE CV LAB;  Service: Cardiovascular;  Laterality: N/A;   PERIPHERAL VASCULAR CATHETERIZATION Right 04/20/2016   Procedure: Peripheral Vascular Intervention;  Surgeon: Sherren Kerns, MD;  Location: [facility] INVASIVE CV LAB;  Service: Cardiovascular;  Laterality: Right;  popiteal   RIGHT HEART CATH N/A 10/27/2018   Procedure: RIGHT HEART CATH;  Surgeon: Laurey Morale, MD;  Location: [facility] INVASIVE CV LAB;  Service: Cardiovascular;  Laterality: N/A;   RIGHT HEART CATH N/A 03/20/2019   Procedure: RIGHT HEART CATH;  Surgeon: Laurey Morale, MD;  Location: [facility] INVASIVE CV LAB;  Service: Cardiovascular;  Laterality: N/A;   THYROIDECTOMY     TOTAL HIP  REVISION Right 08/11/2014   Procedure: RIGHT ACETABULAR REVISION;  Surgeon: Loanne Drilling, MD;  Location: [facility] ORS;  Service: Orthopedics;  Laterality: Right;   VASCULAR SURGERY     VIDEO BRONCHOSCOPY WITH ENDOBRONCHIAL ULTRASOUND Bilateral 08/07/2022   Procedure: VIDEO BRONCHOSCOPY WITH ENDOBRONCHIAL ULTRASOUND;  Surgeon: Josephine Igo, DO;  Location: [facility] ENDOSCOPY;  Service: Pulmonary;  Laterality: Bilateral;   VIDEO BRONCHOSCOPY WITH RADIAL ENDOBRONCHIAL ULTRASOUND  01/23/2022   Procedure: RADIAL ENDOBRONCHIAL ULTRASOUND;  Surgeon: Josephine Igo, DO;  Location: [facility] ENDOSCOPY;  Service: Pulmonary;;   HPI:  [name] [name] is a 72 yo female who presented with dyspnea. Patient reported acute progressive and severe dyspnea, associated with chest tightness, prompting her to call EMS. She was found with 02 saturation in the 80s, she was placed on supplemental 02 per [place] and was transported to the ED, where she was placed on Bipap due to respiratory distress.  Now off BiPAP.  Most recent CXR 12/4: "Mildly improved pulmonary vascular congestion and edema. Persistent  retrocardiac airspace disease and probable small left pleural  effusion."  Pt with with the past medical history of ESRD, heart failure, COPD, hypertension, T2DM, atrial fibrillation, coronary artery disease, and small cell lung cancer (sp radiation 2023).  Hx esophageal stricture with dilation 08/28/23.    Assessment / Plan / Recommendation  Clinical Impression  Pt presents with what appears to be a primary esophageal dysphagia.  Pt reports difficulty getting things to go down.  She had an esophagram 11/18 revealing high grade stricture at the GE junction and EGD with dilation on 11/20 during prior admission.  During that admission she was having regurgitation which was not observed today and she denies.  She reports that she has not had much improvement in swallow function post dilation.  Pt also reports some shortness of breath with PO  intake.  Provided education on esophagea swallow precautions and respiratory precuations. Pt may benefit from furter GI follow up for dysphagia.  If there is specific concern for silent aspiration, rather than CHF, given abnormal chest imaging, consider MBSS. SLP to follow briefly for diet tolerance and to reinforce precautions.  Recommend continuing regular texture diet with thin liquids.   SLP Visit Diagnosis: Dysphagia, pharyngoesophageal phase (R13.14)    Aspiration Risk  No limitations    Diet Recommendation Regular;Thin liquid    Medication Administration:  (As tolerated, crush if needed) Supervision: Intermittent supervision to cue for compensatory strategies Compensations: Slow rate;Small sips/bites;Follow solids with liquid Postural Changes: Seated upright at 90 degrees;Remain upright for at least 30 minutes after po intake    Other  Recommendations Recommended Consults: Consider GI evaluation;Consider esophageal assessment Oral Care Recommendations: Oral care  BID    Recommendations for follow up therapy are one component of a multi-disciplinary discharge planning process, led by the attending physician.  Recommendations may be updated based on patient status, additional functional criteria and insurance authorization.  Follow up Recommendations No SLP follow up      Assistance Recommended at Discharge  N/A  Functional Status Assessment Patient has had a recent decline in their functional status and demonstrates the ability to make significant improvements in function in a reasonable and predictable amount of time.  Frequency and Duration min 1 x/week  1 week       Prognosis Prognosis for improved oropharyngeal function:  (N/A)      Swallow Study   General Date of Onset: 09/09/23 HPI: [name] [name] is a 72 yo female who presented with dyspnea. Patient reported acute progressive and severe dyspnea, associated with chest tightness, prompting her to call EMS. She was  found with 02 saturation in the 80s, she was placed on supplemental 02 per  and was transported to the ED, where she was placed on Bipap due to respiratory distress.  Now off BiPAP.  Most recent CXR 12/4: "Mildly improved pulmonary vascular congestion and edema. Persistent  retrocardiac airspace disease and probable small left pleural  effusion."  Pt with with the past medical history of ESRD, heart failure, COPD, hypertension, T2DM, atrial fibrillation, coronary artery disease, and small cell lung cancer (sp radiation 2023).  Hx esophageal stricture with dilation 08/28/23. Type of Study: Bedside Swallow Evaluation Previous Swallow Assessment: prior clinical assessment Diet Prior to this Study: Regular;Thin liquids (Level 0) Temperature Spikes Noted: No Respiratory Status: Nasal cannula History of Recent Intubation: No Behavior/Cognition: Alert;Cooperative;Pleasant mood Oral Cavity Assessment: Within Functional Limits Oral Care Completed by SLP: No Oral Cavity - Dentition: Dentures, top;Adequate natural dentition Vision: Functional for self-feeding Self-Feeding Abilities: Able to feed self Patient Positioning: Upright in bed Baseline Vocal Quality: Normal Volitional Cough: Strong Volitional Swallow: Able to elicit    Oral/Motor/Sensory Function Overall Oral Motor/Sensory Function: Mild impairment Facial ROM: Within Functional Limits Facial Symmetry: Within Functional Limits Lingual ROM: Within Functional Limits Lingual Symmetry: Within Functional Limits Lingual Strength: Within Functional Limits Velum: Within Functional Limits Mandible: Within Functional Limits   Ice Chips Ice chips: Not tested   Thin Liquid Thin Liquid: Within functional limits    Nectar Thick Nectar Thick Liquid: Not tested   Honey Thick Honey Thick Liquid: Not tested   Puree Puree: Within functional limits   Solid     Solid: Within functional limits      [name] Pleasure, MA, CCC-SLP Acute Rehabilitation  Services Office: [phone] 09/11/2023,11:05 AM

## 2023-09-11 NOTE — Progress Notes (Signed)
NAME:  [name] [name], MRN:  [id number], DOB:  [birth year], LOS: 1 ADMISSION DATE:  09/09/2023, CONSULTATION DATE:  12/3 REFERRING MD:  Ella Jubilee, CHIEF COMPLAINT:  hypotension and acute on chronic resp failure    History of Present Illness:   72 year old female patient with complex medical history including chronic respiratory failure in the setting of COPD, requiring BiPAP at at bedtime, OSA, systolic and diastolic heart failure (grade 1 diastolic heart failure, EF 35 to 40%) , atrial fibs on Eliquis, nonobstructive coronary artery disease, remote non-small cell cancer status post radiation.  Presented to the emergency room from home on 12/2.  Her last dialysis treatment was on 12/1.  Reports had been doing well over the holiday however did begin to notice increasing shortness of breath on day of hospital admission.  Of note she had cut her dialysis treatment short.  Telling providers that when she extends therapy longer than 4 hours she develops nausea, and worsening fatigue.  Over the course of 12/2 developed progressive increasing shortness of breath with worsening work of breathing pulse oximeter on 3 L noted to be 70%.  Initial evaluation her BNP was 1611,BUN 105 up from 56, creatinine 8.09 up from 6.87 baseline, she had elevated troponin at 153, cardiology was consulted, and felt troponin elevation was likely due to decompensated heart failure A chest x-ray was obtained This showed cardiomegaly, pulmonary edema, with what appears to be increased left pleural effusion  Hospital course Placed on noninvasive positive pressure ventilation. Nephrology consulted, as well as cardiology.  All in the emergency room.  Seen by [facility] nurse for right chronic forearm wound Started on intermittent dialysis on 12/3 While getting started systolic blood pressure as low as 70s, this responded to decreasing dialysis rate, and albumin bolus x 2.  Because of her hypotension pulmonary was asked to see, in addition noted  to have worsening acute on chronic respiratory failure and what appeared to be left pleural effusion for which we have been asked to evaluate Pertinent  Medical History  Chronic respiratory failure, end-stage renal disease on hemodialysis TTS, nonobstructive coronary artery disease chronic combined heart failure, atrial fibrillation, mild aortic stenosis, hypertension, obstructive sleep apnea, type 2 diabetes, peripheral vascular disease, prior TIA, history of COPD, history of non-small cell lung cancer status post radiation and felt to be in remission, obesity.  Significant Hospital Events: Including procedures, antibiotic start and stop dates in addition to other pertinent events   12/2 admitted with acute decompensated heart failure.  Suspect additional sodium load during holidays.  Also cutting dialysis short not helping 12/3 started dialysis.  Had been on BiPAP all night, had not received midodrine.  Breathing is better.  Got hypotensive when initiating UF, required albumin  Interim History / Subjective:  Feeling better  Objective   Blood pressure (!) 117/48, pulse 90, temperature 97.6 F (36.4 C), temperature source Axillary, resp. rate 17, height 5\' 3"  (1.6 m), weight 100 kg, SpO2 93%.    Vent Mode: PCV;BIPAP FiO2 (%):  [32 %-50 %] 32 % Set Rate:  [16 bmp] 16 bmp PEEP:  [8 cmH20] 8 cmH20   Intake/Output Summary (Last 24 hours) at 09/11/2023 1028 Last data filed at 09/11/2023 0900 Gross per 24 hour  Intake 480 ml  Output 2400 ml  Net -1920 ml   Filed Weights   09/10/23 1136 09/10/23 2011 09/11/23 0053  Weight: 96.4 kg 100.9 kg 100 kg    Examination: Chronically ill-appearing morbidly obese female sitting on side of bed  no acute distress No JVD or lymphadenopathy is appreciated Left bicep AV graft unremarkable Decreased breath sounds throughout Heart sounds are distant Abdomen is obese soft nontender positive bowel sounds Lower extremities with 2-3+ edema  Resolved  Hospital Problem list     Assessment & Plan:  Acute on chronic hypotension -Does have chronic hypotension in the context of her end-stage renal disease.  Has not received midodrine for several doses now -This seems to be exacerbated by initiation of ultrafiltration Plan Start midodrine Decrease antihypertensives  Acute on chronic respiratory failure secondary to pulmonary edema, volume overload, and worsening left pleural effusion, superimposed on chronic obstructive pulmonary disease -Little better now, desaturates on room air but should be able to tolerate nasal cannula Plan Changed to nasal cannula BiPAP nocturnally Continue bronchodilators No need for thoracentesis  Acute on chronic combined heart failure, with history of nonobstructive coronary artery disease.  Known systolic heart failure EF 35 to 40%, with grade 1 diastolic dysfunction.  Had mild elevated troponin but currently this is flat.  Not a candidate for gold directed medical therapy from a heart failure standpoint -Seen by cardiology Plan She is -2 L for 24 hours   History of chronic atrial fibrillation.  Currently with controlled ventricular rate No longer taking metoprolol Plan Eliquis on hold for possible thoracentesis  End-stage renal disease on IHD TTS -Poor tolerance for extended treatment nearing the 4-hour mark.  This resulted in extensive fatigue and nausea.  Patient frequently requesting shortened HD runs Plan 09/10/2023 2200 cc removed via hemodialysis  History of hypothyroidism Plan Per primary History of type 2 diabetes, now insulin-dependent Plan Per primary  Anemia of chronic kidney disease Recent Labs    09/10/23 0534 09/10/23 0540  HGB 9.1* 10.5*    Plan Per primary  Class II obesity Plan Renal diet  Chronic full-thickness right forearm wound Seen by [facility] nurse Plan Per primary  Best Practice (right click and "Reselect all SmartList Selections" daily)   Diet/type: NPO w/  oral meds DVT prophylaxis:   placed on hold 12/3 for possible left thora  Pressure ulcer(s): identified on 02/Dec/was present on prior admit.  GI prophylaxis: N/A Lines: N/A Foley:  N/A Code Status:  DNR Last date of multidisciplinary goals of care discussion [per primary ]  Labs   CBC: Recent Labs  Lab 09/09/23 2149 09/09/23 2152 09/10/23 0534 09/10/23 0540  WBC 16.7*  --  15.0*  --   NEUTROABS 14.3*  --   --   --   HGB 9.2* 10.9* 9.1* 10.5*  HCT 31.1* 32.0* 32.2* 31.0*  MCV 105.4*  --  107.7*  --   PLT 239  --  226  --     Basic Metabolic Panel: Recent Labs  Lab 09/09/23 2149 09/09/23 2152 09/10/23 0534 09/10/23 0540  NA 142 138 140 137  K 5.7* 5.6* 6.0* 5.8*  CL 98  --  98  --   CO2 25  --  22  --   GLUCOSE 198*  --  205*  --   BUN 105*  --  106*  --   CREATININE 8.09*  --  8.31*  --   CALCIUM 10.1  --  10.0  --   PHOS  --   --  5.7*  --    GFR: Estimated Creatinine Clearance: 6.9 mL/min (A) (by C-G formula based on SCr of 8.31 mg/dL (H)). Recent Labs  Lab 09/09/23 2149 09/10/23 0534 09/10/23 2037 09/11/23 0226  PROCALCITON  --   --  1.27 1.94  WBC 16.7* 15.0*  --   --     Liver Function Tests: Recent Labs  Lab 09/09/23 2149 09/10/23 0534  AST 27  --   ALT 17  --   ALKPHOS 102  --   BILITOT 0.6  --   PROT 6.7  --   ALBUMIN 3.3* 3.3*   No results for input(s): "LIPASE", "AMYLASE" in the last 168 hours. No results for input(s): "AMMONIA" in the last 168 hours.  ABG    Component Value Date/Time   PHART 7.13 (LL) 08/22/2023 1742   PCO2ART 92 (HH) 08/22/2023 1742   PO2ART 75 (L) 08/22/2023 1742   HCO3 28.5 (H) 09/10/2023 0540   TCO2 30 09/10/2023 0540   ACIDBASEDEF 1.2 08/22/2023 1742   O2SAT 58 09/10/2023 0540     Coagulation Profile: No results for input(s): "INR", "PROTIME" in the last 168 hours.  Cardiac Enzymes: No results for input(s): "CKTOTAL", "CKMB", "CKMBINDEX", "TROPONINI" in the last 168 hours.  HbA1C: Hgb A1c MFr Bld   Date/Time Value Ref Range Status  04/13/2023 06:58 PM 7.1 (H) 4.8 - 5.6 % Final    Comment:    (NOTE) Pre diabetes:          5.7%-6.4%  Diabetes:              >6.4%  Glycemic control for   <7.0% adults with diabetes   08/08/2022 01:30 AM 7.0 (H) 4.8 - 5.6 % Final    Comment:    (NOTE) Pre diabetes:          5.7%-6.4%  Diabetes:              >6.4%  Glycemic control for   <7.0% adults with diabetes     CBG: Recent Labs  Lab 09/10/23 2108 09/11/23 0633  GLUCAP 129* 115*     Steve Reynold Mantell ACNP Acute Care Nurse Practitioner [facility] Please consult Amion 09/11/2023, 10:29 AM

## 2023-09-11 NOTE — Plan of Care (Signed)
  Problem: Education: Goal: Ability to describe self-care measures that may prevent or decrease complications (Diabetes Survival Skills Education) will improve Outcome: Progressing   Problem: Education: Goal: Knowledge of General Education information will improve Description: Including pain rating scale, medication(s)/side effects and non-pharmacologic comfort measures Outcome: Progressing   Problem: Clinical Measurements: Goal: Respiratory complications will improve Outcome: Progressing   Problem: Activity: Goal: Risk for activity intolerance will decrease Outcome: Progressing   Problem: Coping: Goal: Level of anxiety will decrease Outcome: Progressing

## 2023-09-12 DIAGNOSIS — I5023 Acute on chronic systolic (congestive) heart failure: Secondary | ICD-10-CM | POA: Diagnosis not present

## 2023-09-12 DIAGNOSIS — R0600 Dyspnea, unspecified: Secondary | ICD-10-CM

## 2023-09-12 LAB — CBC
HCT: 24.7 % — ABNORMAL LOW (ref 36.0–46.0)
HCT: 26.5 % — ABNORMAL LOW (ref 36.0–46.0)
Hemoglobin: 7.1 g/dL — ABNORMAL LOW (ref 12.0–15.0)
Hemoglobin: 8 g/dL — ABNORMAL LOW (ref 12.0–15.0)
MCH: 30.1 pg (ref 26.0–34.0)
MCH: 31.6 pg (ref 26.0–34.0)
MCHC: 28.7 g/dL — ABNORMAL LOW (ref 30.0–36.0)
MCHC: 30.2 g/dL (ref 30.0–36.0)
MCV: 104.7 fL — ABNORMAL HIGH (ref 80.0–100.0)
MCV: 104.7 fL — ABNORMAL HIGH (ref 80.0–100.0)
Platelets: 195 10*3/uL (ref 150–400)
Platelets: 201 10*3/uL (ref 150–400)
RBC: 2.36 MIL/uL — ABNORMAL LOW (ref 3.87–5.11)
RBC: 2.53 MIL/uL — ABNORMAL LOW (ref 3.87–5.11)
RDW: 15.8 % — ABNORMAL HIGH (ref 11.5–15.5)
RDW: 15.9 % — ABNORMAL HIGH (ref 11.5–15.5)
WBC: 8.3 10*3/uL (ref 4.0–10.5)
WBC: 9.2 10*3/uL (ref 4.0–10.5)
nRBC: 1 % — ABNORMAL HIGH (ref 0.0–0.2)
nRBC: 0.9 % — ABNORMAL HIGH (ref 0.0–0.2)

## 2023-09-12 LAB — COMPREHENSIVE METABOLIC PANEL
ALT: 14 U/L (ref 0–44)
AST: 19 U/L (ref 15–41)
Albumin: 3.4 g/dL — ABNORMAL LOW (ref 3.5–5.0)
Alkaline Phosphatase: 50 U/L (ref 38–126)
Anion gap: 17 — ABNORMAL HIGH (ref 5–15)
BUN: 70 mg/dL — ABNORMAL HIGH (ref 8–23)
CO2: 26 mmol/L (ref 22–32)
Calcium: 9.5 mg/dL (ref 8.9–10.3)
Chloride: 94 mmol/L — ABNORMAL LOW (ref 98–111)
Creatinine, Ser: 6.87 mg/dL — ABNORMAL HIGH (ref 0.44–1.00)
GFR, Estimated: 6 mL/min — ABNORMAL LOW (ref >60)
Glucose, Bld: 114 mg/dL — ABNORMAL HIGH (ref 70–99)
Potassium: 5.2 mmol/L — ABNORMAL HIGH (ref 3.5–5.1)
Sodium: 137 mmol/L (ref 135–145)
Total Bilirubin: 0.6 mg/dL (ref <1.2)
Total Protein: 6.1 g/dL — ABNORMAL LOW (ref 6.5–8.1)

## 2023-09-12 LAB — GLUCOSE, CAPILLARY
Glucose-Capillary: 104 mg/dL — ABNORMAL HIGH (ref 70–99)
Glucose-Capillary: 96 mg/dL (ref 70–99)
Glucose-Capillary: 163 mg/dL — ABNORMAL HIGH (ref 70–99)
Glucose-Capillary: 153 mg/dL — ABNORMAL HIGH (ref 70–99)

## 2023-09-12 LAB — HEPATITIS B SURFACE ANTIBODY, QUANTITATIVE: Hep B S AB Quant (Post): 34.4 m[IU]/mL

## 2023-09-12 MED ORDER — POLYETHYLENE GLYCOL 3350 17 G PO PACK
17.0000 g | PACK | Freq: Once | ORAL | Status: AC
  Administered 2023-09-12: 17 g via ORAL
  Filled 2023-09-12: qty 1

## 2023-09-12 MED ORDER — ALBUMIN HUMAN 25 % IV SOLN
25.0000 g | Freq: Once | INTRAVENOUS | Status: DC
  Filled 2023-09-12: qty 100

## 2023-09-12 NOTE — Procedures (Signed)
Patient was seen on dialysis and the procedure was supervised.  BFR 400   Via AVG BP is  163/73. UF as tolerated, discussed with dialysis nurse.  Patient appears to be tolerating treatment well.  Jennelle Pinkstaff Jaynie Collins 09/12/2023

## 2023-09-12 NOTE — Plan of Care (Signed)

## 2023-09-12 NOTE — Plan of Care (Signed)
  Problem: Education: Goal: Ability to describe self-care measures that may prevent or decrease complications (Diabetes Survival Skills Education) will improve Outcome: Progressing   Problem: Coping: Goal: Ability to adjust to condition or change in health will improve Outcome: Progressing   

## 2023-09-12 NOTE — TOC Initial Note (Addendum)
Transition of Care [facility]) - Initial/Assessment Note    Patient Details  Name: [name] [name] MRN: [id number] Date of Birth: 1950/11/12  Transition of Care [facility]) CM/SW Contact:    Michaela Corner, LCSWA Phone Number: 09/12/2023, 3:24 PM  Clinical Narrative:  CSW spoke with pt about PT recs for SNF. Pt states she does not want to go to SNF at this time. CSW asked pt if she has support at home. [name] [name] said she lives with her son and has a nurses aid that comes in the morning time for several hours. CSW let treatment team know that pt is declining SNF at this time.      Expected Discharge Plan: Skilled Nursing Facility Barriers to Discharge: Continued Medical Work up   Patient Goals and CMS Choice Patient states their goals for this hospitalization and ongoing recovery are:: To go home          Expected Discharge Plan and Services In-house Referral: Clinical Social Work     Living arrangements for the past 2 months: Single Family Home                                      Prior Living Arrangements/Services Living arrangements for the past 2 months: Single Family Home Lives with:: Adult Children (Son) Patient language and need for interpreter reviewed:: Yes Do you feel safe going back to the place where you live?: Yes      Need for Family Participation in Patient Care: Yes (Comment) Care giver support system in place?: Yes (comment)   Criminal Activity/Legal Involvement Pertinent to Current Situation/Hospitalization: No - Comment as needed  Activities of Daily Living   ADL Screening (condition at time of admission) Independently performs ADLs?: No Does the patient have a NEW difficulty with bathing/dressing/toileting/self-feeding that is expected to last >3 days?: No Does the patient have a NEW difficulty with getting in/out of bed, walking, or climbing stairs that is expected to last >3 days?: No Does the patient have a NEW difficulty with communication  that is expected to last >3 days?: No Is the patient deaf or have difficulty hearing?: No Does the patient have difficulty seeing, even when wearing glasses/contacts?: Yes Does the patient have difficulty concentrating, remembering, or making decisions?: Yes  Permission Sought/Granted                  Emotional Assessment Appearance:: Appears stated age Attitude/Demeanor/Rapport: Unable to Assess Affect (typically observed): Unable to Assess Orientation: : Oriented to Self, Oriented to Place, Oriented to  Time, Oriented to Situation Alcohol / Substance Use: Not Applicable Psych Involvement: No (comment)  Admission diagnosis:  COPD exacerbation (HCC) [J44.1] ESRD (end stage renal disease) on dialysis (HCC) [N18.6, Z99.2] Acute on chronic combined systolic (congestive) and diastolic (congestive) heart failure (HCC) [I50.43] Acute on chronic congestive heart failure, unspecified heart failure type [facility]) [I50.9] Patient Active Problem List   Diagnosis Date Noted   Acute on chronic systolic CHF (congestive heart failure) (HCC) 09/10/2023   Paroxysmal atrial fibrillation (HCC) 09/10/2023   Hypotension 08/22/2023   Acute encephalopathy 08/22/2023   Acute congestive heart failure (HCC) 08/22/2023   Acute on chronic respiratory failure with hypoxia and hypercapnia (HCC) 07/15/2023   Acute metabolic encephalopathy 07/15/2023   Septic shock (HCC) 07/15/2023   Acute respiratory failure with hypercapnia (HCC) 07/11/2023   CAD (coronary artery disease) 07/06/2023   ESRD on dialysis (HCC)  07/05/2023   Acute respiratory failure (HCC) 08/07/2022   Respiratory failure, post-operative (HCC) 08/07/2022   Pulmonary edema 08/07/2022   Acute on chronic respiratory failure with hypercapnia (HCC) 08/07/2022   Lung mass 07/30/2022   Lung cancer, lower lobe (HCC) 01/31/2022   Pressure injury of skin 12/06/2021   Malnutrition of moderate degree 12/01/2021   ESRD (end stage renal disease) on  dialysis [facility])    Lung nodules    Advanced care planning/counseling discussion    Goals of care, counseling/discussion    Acute respiratory failure with hypoxia and hypercarbia (HCC)    Labored breathing    Kidney lesion, native, left 11/24/2021   Syncope 11/21/2021   Chronic pain syndrome 07/01/2021   COPD exacerbation (HCC) 07/01/2021   Cellulitis of leg, right 07/01/2021   SARS-CoV-2 positive 02/21/2021   Increased weakness when ambulating 02/17/2021   Intractable nausea and vomiting 02/15/2021   Symptomatic anemia 01/24/2021   Anemia, unspecified 01/08/2020   Bacteremia 01/07/2020   UTI (urinary tract infection) 01/06/2020   Anemia 10/26/2019   S/P lumbar fusion 09/18/2019   Acute on chronic combined systolic and diastolic CHF (congestive heart failure) (HCC) 03/16/2019   Leukocytosis 03/16/2019   Chest pain 11/27/2018   Pre-syncope 11/27/2018   Epigastric abdominal pain 11/27/2018   Solitary pulmonary nodule 11/06/2018   Hypertensive heart and renal disease, stage 1-4 or unspecified chronic kidney disease, with heart failure (HCC) 10/20/2018   CHF (congestive heart failure), NYHA class III, acute on chronic, systolic (HCC) 10/20/2018   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, unspecified extremity (HCC) 04/19/2016   Cellulitis of abdominal wall 03/21/2015   Acute on chronic renal failure (HCC) 03/21/2015   DM (diabetes mellitus), type 2 with renal complications (HCC) 03/21/2015   Abdominal wall abscess 03/21/2015   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   Low urine output 08/11/2014   COPD with asthma (HCC) 08/11/2014   Hypothyroidism 08/11/2014   OSA (obstructive sleep apnea) 08/11/2014   Positive cardiac stress test 06/29/2014   Hyperlipidemia 02/12/2014   Left arm weakness 12/23/2013   TIA (transient ischemic attack) 12/23/2013   Left buttock abscess 10/12/2013   Cellulitis and abscess of leg, right 04/16/2013   Acute blood loss  anemia 02/14/2013   HTN (hypertension) 02/14/2013   Cellulitis and abscess of buttock 02/09/2013   Class 2 obesity due to excess calories with body mass index (BMI) of 36.0 to 36.9 in adult 02/03/2013   Diarrhea 02/03/2013   Hyponatremia 02/03/2013   Sepsis due to undetermined organism (HCC) 02/02/2013   Diabetes mellitus without complication (HCC) 02/02/2013   CKD (chronic kidney disease), stage IV (HCC) 02/02/2013   Cellulitis 02/02/2013   PAD (peripheral artery disease) (HCC) 04/26/2011   Tobacco abuse 04/26/2011   PCP:  Vladimir Crofts, FNP Pharmacy:   [facility] [phone] [place], [address] AT [facility] [address] & [address] [place] [address] Phone: [phone] Fax: [phone]  [facility] [address] [place] [address] Phone: [phone] Fax: [phone]     Social Determinants of Health (SDOH) Social History: SDOH Screenings   Food Insecurity: No Food Insecurity (09/10/2023)  Housing: Patient Declined (09/10/2023)  Transportation Needs: No Transportation Needs (09/10/2023)  Utilities: Not At Risk (09/10/2023)  Tobacco Use: Medium Risk (09/09/2023)   SDOH Interventions:     Readmission Risk Interventions    08/21/2023    3:23 PM 05/09/2023  3:37 PM 05/08/2023   12:17 PM  Readmission Risk Prevention Plan  Transportation Screening Complete Complete Complete  Medication Review (RN Care Manager) Referral to Pharmacy Complete Complete  PCP or Specialist appointment within 3-5 days of discharge Complete Complete Complete  HRI or Home Care Consult Complete Complete Complete  SW Recovery Care/Counseling Consult Complete Complete Complete  Palliative Care Screening Not Applicable Not Applicable Not Applicable  Skilled Nursing Facility Not Applicable Complete Complete

## 2023-09-12 NOTE — Progress Notes (Addendum)
PROGRESS NOTE    [name] [name]  WUJ:[id number] DOB: September 14, 1951 DOA: 09/09/2023 PCP: Vladimir Crofts, FNP  72/F with chronic systolic CHF, COPD on 2 to 3 L home O2, history of lung cancer sp XRT 2023, CAD, hypertension, type 2 diabetes mellitus, atrial fibrillation presented to the ED with progressive dyspnea on exertion.  Noted to be hypoxic and distress, transported to the ED placed on BiPAP. -Chest x-ray noted cardiomegaly, pulmonary vascular congestion, moderate left pleural effusion and CHF  -Nephrology consulted, dialyzed yesterday -Echo noted worsening cardiomyopathy, cardiology consulting  Subjective: -breathing better after HD, c/o constipation  Assessment and Plan:  Acute on chronic systolic CHF  Severe pulmonary hypertension -Echo noted worsening cardiomyopathy, EF down to 20-25% with severely elevated PA systolic pressures -Has not been noted to have worsening cardiomyopathy and EF over the last several months, LHC 9/24 with mild nonobstructive CAD -Admitted with volume overload despite compliance with dialysis  -GDMT limited by CKD 5  -Continue metoprolol  -volume removal with HD today -DC planning, home tomorrow, prognosis is poor with worsening Cardiomyopathy, Puln HTN, ESRD, Severe debility and Obesity  ESRD on dialysis (HCC) -Dialysis today, nephrology following  Acute hypoxemic respiratory failure due to acute pulmonary edema with left pleural effusion. Bronchitis/pneumonia suspected in addition -Also gives history of low-grade fevers, has leukocytosis and elevated procalcitonin, x-ray with retrocardiac opacity, will add 5 days of Augmentin -Volume removal with dialysis  Anemia of chronic dz -Hb lower at 7 today, unclear if accurate, will repeat -CBC in am  CAD (coronary artery disease) No ACS Elevated troponin likely due to volume overload.    Paroxysmal atrial fibrillation (HCC) Continue anticoagulation with apixaban.  Hold on AV blockade for now.    DM (diabetes mellitus), type 2  -CBGs are stable, continue current regimen of sliding scale insulin  History of small cell lung cancer -Followed by Dr. Shirline Frees, treated with XRT 2023 -Due for PET scan soon  Class 2 obesity due to excess calories with body mass index (BMI) of 36.0 to 36.9 in adult Calculated BMI is 37   Abnormal TSH -On Synthroid, TSH was normal 2 months ago, now 13.6, I wonder this if this is related to decreased absorption from CHF/bowel edema -Recommend repeating labs in 6 weeks  DVT prophylaxis:  heparin subcutaneous Code Status: DNR Family Communication: None present Disposition Plan: Home likely tomorrow  Consultants:    Procedures:   Antimicrobials:    Objective: Vitals:   09/12/23 1230 09/12/23 1244 09/12/23 1251 09/12/23 1319  BP: (!) 150/74 139/84 (!) 144/65 (!) 152/67  Pulse: 90 87 90 93  Resp: 20 18 19  (!) 22  Temp:   98.1 F (36.7 C) 98.5 F (36.9 C)  TempSrc:    Oral  SpO2: 100% 99% 100% 97%  Weight:   97 kg   Height:        Intake/Output Summary (Last 24 hours) at 09/12/2023 1403 Last data filed at 09/12/2023 1251 Gross per 24 hour  Intake 290 ml  Output 6000 ml  Net -5710 ml   Filed Weights   09/11/23 0053 09/12/23 0815 09/12/23 1251  Weight: 100 kg 100 kg 97 kg    Examination:  General exam: Obese chronically ill female laying in bed, AAOx3 HEENT: Neck obese unable to assess JVD CVS: S1-S2, regular rhythm Lungs: Decreased breath sounds at the bases Abdomen: Soft, nontender, bowel sounds present Extremities: Trace edema  Psychiatry:  Mood & affect appropriate.     Data Reviewed:  CBC: Recent Labs  Lab 09/09/23 2149 09/09/23 2152 09/10/23 0534 09/10/23 0540 09/12/23 0238  WBC 16.7*  --  15.0*  --  8.3  NEUTROABS 14.3*  --   --   --   --   HGB 9.2* 10.9* 9.1* 10.5* 7.1*  HCT 31.1* 32.0* 32.2* 31.0* 24.7*  MCV 105.4*  --  107.7*  --  104.7*  PLT 239  --  226  --  195   Basic Metabolic Panel: Recent  Labs  Lab 09/09/23 2149 09/09/23 2152 09/10/23 0534 09/10/23 0540 09/12/23 0238  NA 142 138 140 137 137  K 5.7* 5.6* 6.0* 5.8* 5.2*  CL 98  --  98  --  94*  CO2 25  --  22  --  26  GLUCOSE 198*  --  205*  --  114*  BUN 105*  --  106*  --  70*  CREATININE 8.09*  --  8.31*  --  6.87*  CALCIUM 10.1  --  10.0  --  9.5  PHOS  --   --  5.7*  --   --    GFR: Estimated Creatinine Clearance: 8.2 mL/min (A) (by C-G formula based on SCr of 6.87 mg/dL (H)). Liver Function Tests: Recent Labs  Lab 09/09/23 2149 09/10/23 0534 09/12/23 0238  AST 27  --  19  ALT 17  --  14  ALKPHOS 102  --  50  BILITOT 0.6  --  0.6  PROT 6.7  --  6.1*  ALBUMIN 3.3* 3.3* 3.4*   No results for input(s): "LIPASE", "AMYLASE" in the last 168 hours. No results for input(s): "AMMONIA" in the last 168 hours. Coagulation Profile: No results for input(s): "INR", "PROTIME" in the last 168 hours. Cardiac Enzymes: No results for input(s): "CKTOTAL", "CKMB", "CKMBINDEX", "TROPONINI" in the last 168 hours. BNP (last 3 results) No results for input(s): "PROBNP" in the last 8760 hours. HbA1C: No results for input(s): "HGBA1C" in the last 72 hours. CBG: Recent Labs  Lab 09/11/23 1132 09/11/23 1702 09/11/23 2114 09/12/23 0537 09/12/23 1329  GLUCAP 164* 150* 126* 104* 96   Lipid Profile: No results for input(s): "CHOL", "HDL", "LDLCALC", "TRIG", "CHOLHDL", "LDLDIRECT" in the last 72 hours. Thyroid Function Tests: Recent Labs    09/10/23 2037  TSH 13.623*   Anemia Panel: No results for input(s): "VITAMINB12", "FOLATE", "FERRITIN", "TIBC", "IRON", "RETICCTPCT" in the last 72 hours. Urine analysis:    Component Value Date/Time   COLORURINE YELLOW 11/27/2021 0956   APPEARANCEUR HAZY (A) 11/27/2021 0956   LABSPEC 1.012 11/27/2021 0956   PHURINE 5.0 11/27/2021 0956   GLUCOSEU NEGATIVE 11/27/2021 0956   HGBUR NEGATIVE 11/27/2021 0956   BILIRUBINUR NEGATIVE 11/27/2021 0956   KETONESUR NEGATIVE 11/27/2021  0956   PROTEINUR 30 (A) 11/27/2021 0956   UROBILINOGEN 0.2 08/04/2014 1409   NITRITE NEGATIVE 11/27/2021 0956   LEUKOCYTESUR NEGATIVE 11/27/2021 0956   Sepsis Labs: @LABRCNTIP (procalcitonin:4,lacticidven:4)  )No results found for this or any previous visit (from the past 240 hour(s)).   Radiology Studies: DG Chest Port 1 View  Result Date: 09/11/2023 CLINICAL DATA:  Shortness of breath.  Evaluate for pleural effusion. EXAM: PORTABLE CHEST 1 VIEW COMPARISON:  Radiographs 09/09/2023 and 08/24/2023.  CT 07/11/2023. FINDINGS: 0506 hours. Stable cardiomegaly and aortic atherosclerosis. Pulmonary vascular congestion and edema appear mildly improved. There is persistent retrocardiac airspace disease and a probable small left pleural effusion. No evidence of significant right-sided pleural effusion or pneumothorax. The bones appear unchanged. Telemetry leads overlie the  chest. IMPRESSION: Mildly improved pulmonary vascular congestion and edema. Persistent retrocardiac airspace disease and probable small left pleural effusion. Electronically Signed   By: Carey Bullocks M.D.   On: 09/11/2023 08:56     Scheduled Meds:  amoxicillin-clavulanate  1 tablet Oral q1800   arformoterol  15 mcg Nebulization Q12H   atorvastatin  20 mg Oral Daily   budesonide (PULMICORT) nebulizer solution  0.5 mg Nebulization BID   Chlorhexidine Gluconate Cloth  6 each Topical Q0600   Chlorhexidine Gluconate Cloth  6 each Topical Q0600   collagenase   Topical Daily   heparin injection (subcutaneous)  5,000 Units Subcutaneous Q8H   insulin aspart  0-6 Units Subcutaneous TID WC   levothyroxine  175 mcg Oral Daily   midodrine  15 mg Oral TID WC   multivitamins with iron  1 tablet Oral q AM   revefenacin  175 mcg Nebulization Daily   sevelamer carbonate  1,600 mg Oral TID WC   thiamine  100 mg Oral q AM   Continuous Infusions:  albumin human       LOS: 2 days    Time spent:    Zannie Cove, MD [facility]   09/12/2023, 2:03 PM

## 2023-09-12 NOTE — Progress Notes (Signed)
   09/12/23 1251  Vitals  Temp 98.1 F (36.7 C)  Pulse Rate 90  Resp 19  BP (!) 144/65  SpO2 100 %  O2 Device Nasal Cannula  Weight 97 kg  Type of Weight Post-Dialysis  Oxygen Therapy  O2 Flow Rate (L/min) 3 L/min  Patient Activity (if Appropriate) In bed  Pulse Oximetry Type Continuous  Oximetry Probe Site Changed No  Post Treatment  Dialyzer Clearance Lightly streaked  Hemodialysis Intake (mL) 0 mL  Liters Processed 94  Fluid Removed (mL) 3000 mL  Tolerated HD Treatment Yes  AVG/AVF Arterial Site Held (minutes) 10 minutes  AVG/AVF Venous Site Held (minutes) 10 minutes   Received patient in bed to unit.  Alert and oriented.  Informed consent signed and in chart.   TX duration:4.0  Patient tolerated well.  Transported back to the room  Alert, without acute distress.  Hand-off given to patient's nurse.   Access used: LUAG Access issues: difficulty threading the needles x 2  Total UF removed: 3000 Medication(s) given: none   [facility]

## 2023-09-13 ENCOUNTER — Other Ambulatory Visit (HOSPITAL_COMMUNITY): Payer: 59

## 2023-09-13 ENCOUNTER — Ambulatory Visit (HOSPITAL_COMMUNITY): Payer: No Typology Code available for payment source

## 2023-09-13 DIAGNOSIS — I5023 Acute on chronic systolic (congestive) heart failure: Secondary | ICD-10-CM | POA: Diagnosis not present

## 2023-09-13 LAB — GLUCOSE, CAPILLARY
Glucose-Capillary: 125 mg/dL — ABNORMAL HIGH (ref 70–99)
Glucose-Capillary: 133 mg/dL — ABNORMAL HIGH (ref 70–99)
Glucose-Capillary: 168 mg/dL — ABNORMAL HIGH (ref 70–99)
Glucose-Capillary: 147 mg/dL — ABNORMAL HIGH (ref 70–99)

## 2023-09-13 LAB — CBC
HCT: 28.5 % — ABNORMAL LOW (ref 36.0–46.0)
Hemoglobin: 8.2 g/dL — ABNORMAL LOW (ref 12.0–15.0)
MCH: 30.6 pg (ref 26.0–34.0)
MCHC: 28.8 g/dL — ABNORMAL LOW (ref 30.0–36.0)
MCV: 106.3 fL — ABNORMAL HIGH (ref 80.0–100.0)
Platelets: 201 10*3/uL (ref 150–400)
RBC: 2.68 MIL/uL — ABNORMAL LOW (ref 3.87–5.11)
RDW: 15.9 % — ABNORMAL HIGH (ref 11.5–15.5)
WBC: 7.9 10*3/uL (ref 4.0–10.5)
nRBC: 1 % — ABNORMAL HIGH (ref 0.0–0.2)

## 2023-09-13 LAB — BASIC METABOLIC PANEL
Anion gap: 13 (ref 5–15)
BUN: 34 mg/dL — ABNORMAL HIGH (ref 8–23)
CO2: 28 mmol/L (ref 22–32)
Calcium: 9.5 mg/dL (ref 8.9–10.3)
Chloride: 93 mmol/L — ABNORMAL LOW (ref 98–111)
Creatinine, Ser: 4.55 mg/dL — ABNORMAL HIGH (ref 0.44–1.00)
GFR, Estimated: 10 mL/min — ABNORMAL LOW (ref >60)
Glucose, Bld: 149 mg/dL — ABNORMAL HIGH (ref 70–99)
Potassium: 4 mmol/L (ref 3.5–5.1)
Sodium: 134 mmol/L — ABNORMAL LOW (ref 135–145)

## 2023-09-13 MED ORDER — AMOXICILLIN-POT CLAVULANATE 250-125 MG PO TABS: 1.0000 | ORAL_TABLET | Freq: Every day | ORAL | 0 refills | Status: AC

## 2023-09-13 MED ORDER — CHLORHEXIDINE GLUCONATE CLOTH 2 % EX PADS
6.0000 | MEDICATED_PAD | Freq: Every day | CUTANEOUS | Status: DC
Start: 2023-09-14 — End: 2023-09-13

## 2023-09-13 MED ORDER — APIXABAN 5 MG PO TABS
5.0000 mg | ORAL_TABLET | Freq: Two times a day (BID) | ORAL | Status: DC
  Administered 2023-09-13: 5 mg via ORAL
  Filled 2023-09-13: qty 1

## 2023-09-13 MED ORDER — ATORVASTATIN CALCIUM 20 MG PO TABS: 20.0000 mg | ORAL_TABLET | Freq: Every day | ORAL | 0 refills | Status: DC

## 2023-09-13 NOTE — Plan of Care (Signed)
  Problem: Coping: Goal: Ability to adjust to condition or change in health will improve Outcome: Progressing   Problem: Fluid Volume: Goal: Ability to maintain a balanced intake and output will improve Outcome: Progressing

## 2023-09-13 NOTE — Discharge Planning (Signed)
[facility] Patient Discharge Orders- [facility] CLINIC: [facility]  Patient's name: [name] [name] Admit/DC Dates: 09/09/2023 - 09/13/23  Discharge Diagnoses: Acute on chronic CHF, cardiology consulted, recommended conservative management, on BB Bronchitis/PNA, on PO Augmentin CAD - No ACS, elevated troponins likely 2nd volume overload  Aranesp: Given: No     Last Hgb: 8.2 PRBC's Given: No ESA dose for discharge: mircera 150 mcg IV q 2 weeks, next dose due on 09/21/23 IV Iron dose at discharge: N/A  Heparin change: No  EDW Change: No  Bath Change: No  Access intervention/Change: No  Hectorol change: No  Discharge Labs: Calcium 9.5 Phosphorus N/A  Albumin 3.4    K+ 4.0  IV Antibiotics: Yes Details: Bronchitis/PNA on PO Augmentin (dose is renal appropriate)  On Coumadin?: No, on Eliquis   OTHER/APPTS/LAB ORDERS:    D/C Meds to be reconciled by nurse after every discharge.  Completed By: Salome Holmes, NP   Reviewed by: MD:______ RN_______

## 2023-09-13 NOTE — TOC Transition Note (Addendum)
Transition of Care [facility]) - CM/SW Discharge Note   Patient Details  Name: [name] [name] MRN: [id number] Date of Birth: [birth year]  Transition of [facility]) CM/SW Contact:  Leone Haven, RN Phone Number: 09/13/2023, 3:11 PM   Clinical Narrative:    NCM notified that patient does not want to go to SNF, she wants [facility] services. She is still active with [facility] and would like to continue for HHRN,HHPT, HHOT. NCM confirmed with [facility].  Soc will begin 24 to 48 hrs post dc. She states she will need ambulance transport home. NCM contacted son with patient's permission.  Informed him she will be dc today and that she will have [facility], also informed him if he wants to increase her pcs service hours to call the people who she has the aide with. She states aide comes out on HD days TTHSAT from 10 to 12 and then and MWF 10 to 1 pm. PTAR scheduled.    Final next level of care: Home w Home Health Services Barriers to Discharge: No Barriers Identified   Patient Goals and CMS Choice CMS Medicare.gov Compare Post Acute Care list provided to:: Patient    Discharge Placement                         Discharge Plan and Services Additional resources added to the After Visit Summary for   In-house Referral: Clinical Social Work              DME Arranged: N/A DME Agency: NA       HH Arranged: RN, PT, OT HH Agency: [facility] Date [facility] Agency Contacted: 09/13/23 Time HH Agency Contacted: 1511 Representative spoke with at [facility] Agency: Kandee Keen  Social Determinants of Health (SDOH) Interventions SDOH Screenings   Food Insecurity: No Food Insecurity (09/10/2023)  Housing: Patient Declined (09/10/2023)  Transportation Needs: No Transportation Needs (09/10/2023)  Utilities: Not At Risk (09/10/2023)  Tobacco Use: Medium Risk (09/09/2023)     Readmission Risk Interventions    08/21/2023    3:23 PM 05/09/2023    3:37 PM 05/08/2023   12:17 PM  Readmission Risk Prevention Plan   Transportation Screening Complete Complete Complete  Medication Review (RN Care Manager) Referral to Pharmacy Complete Complete  PCP or Specialist appointment within 3-5 days of discharge Complete Complete Complete  HRI or Home Care Consult Complete Complete Complete  SW Recovery Care/Counseling Consult Complete Complete Complete  Palliative Care Screening Not Applicable Not Applicable Not Applicable  Skilled Nursing Facility Not Applicable Complete Complete

## 2023-09-13 NOTE — Progress Notes (Signed)
Physical Therapy Treatment Patient Details Name: [name] [name] MRN: [id number] DOB: 1950-11-29 Today's Date: 09/13/2023   History of Present Illness Pt is a 72 y.o. female presenting 09/09/2023 with shortness of breath. Recent admission 11/24 with respiratory failure. 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    PT Comments  Patient resting in bed at start of session and agreeable to therapy visit. Patient initiated reaching to bed rail and walking LE's to EOB, max assist with bed pad to pivot hips/turn and raise trunk upright. Pt able to maintain seated balance at EOB and completed LE strengthening and seated balance activities with anterior trunk leans and Bil UE reaching. Patient will benefit from continued skilled PT intervention during acute stay and follow up HHPT. Will progress as able.    If plan is discharge home, recommend the following: Two people to help with walking and/or transfers;A lot of help with bathing/dressing/bathroom;Assistance with cooking/housework;Assist for transportation;Help with stairs or ramp for entrance;Supervision due to cognitive status   Can travel by private vehicle     No  Equipment Recommendations  None recommended by PT    Recommendations for Other Services       Precautions / Restrictions Precautions Precautions: Fall Precaution Comments: watch sats and bp; hx of gout in feet Lt>Rt Restrictions Weight Bearing Restrictions: No     Mobility  Bed Mobility Overal bed mobility: Needs Assistance Bed Mobility: Supine to Sit, Sit to Supine     Supine to sit: Used rails, Max assist, HOB elevated Sit to supine: Max assist, Used rails, HOB elevated   General bed mobility comments: cues to sequence, pt  initiated reach to rail and moving LE's off EOB. Max assist with pad to pivto and sit upright. Max assist to bring LE's back onto bed to return supine.    Transfers                        Ambulation/Gait                   Stairs             Wheelchair Mobility     Tilt Bed    Modified Rankin (Stroke Patients Only)       Balance Overall balance assessment: Needs assistance Sitting-balance support: Bilateral upper extremity supported, Feet supported Sitting balance-Leahy Scale: Fair Sitting balance - Comments: sitting EOB                                    Cognition Arousal: Alert Behavior During Therapy: WFL for tasks assessed/performed Overall Cognitive Status: No family/caregiver present to determine baseline cognitive functioning                                          Exercises General Exercises - Lower Extremity Long Arc Quad: AROM, Both, 10 reps Toe Raises: AROM, Both, 20 reps Heel Raises: AROM, Both, 20 reps Other Exercises Other Exercises: Anterior trunk lean (hands on knees slide to shin0 10 reps Other Exercises: 5  reps bil UE anterior reaching    General Comments        Pertinent Vitals/Pain Pain Assessment Pain Assessment: No/denies pain Pain Intervention(s): Monitored during session, Repositioned    Home Living                          Prior Function            PT Goals (current goals can now be found in the care plan section) Acute Rehab PT Goals Patient Stated Goal: return home PT Goal Formulation: With patient Time For Goal Achievement: 09/25/23 Potential to Achieve Goals: Fair Progress towards PT goals: Progressing toward goals    Frequency    Min 1X/week      PT Plan      Co-evaluation              AM-PAC PT "6 Clicks" Mobility   Outcome Measure  Help needed turning from your back to your side while in a flat bed without using bedrails?: A  Lot Help needed moving from lying on your back to sitting on the side of a flat bed without using bedrails?: Total Help needed moving to and from a bed to a chair (including a wheelchair)?: Total Help needed standing up from a chair using your arms (e.g., wheelchair or bedside chair)?: Total Help needed to walk in hospital room?: Total Help needed climbing 3-5 steps with a railing? : Total 6 Click Score: 7    End of Session Equipment Utilized During Treatment: Oxygen Activity Tolerance: Patient tolerated treatment well Patient left: in bed;with call bell/phone within reach;with bed alarm set Nurse Communication: Mobility status PT Visit Diagnosis: Other abnormalities of gait and mobility (R26.89);Muscle weakness (generalized) (M62.81)     Time: 2841-3244 PT Time Calculation (min) (ACUTE ONLY): 20 min  Charges:    $Therapeutic Activity: 8-22 mins PT General Charges $$ ACUTE PT VISIT: 1 Visit                     Wynn Maudlin, DPT Acute Rehabilitation Services Office [phone]  09/13/23 2:47 PM

## 2023-09-13 NOTE — Progress Notes (Signed)
 D/C order noted. Contacted [facility] to be advised of pt's d/c today and that pt should resume care tomorrow.   Olivia Canter Renal Navigator [phone]

## 2023-09-13 NOTE — Discharge Summary (Signed)
Physician Discharge Summary  [name] [name]:[id number] DOB: 1950-12-16 DOA: 09/09/2023  PCP: Vladimir Crofts, FNP  Admit date: 09/09/2023 Discharge date: 09/13/2023  Time spent: 45 minutes  Recommendations for Outpatient Follow-up:  Resume dialysis Tuesday Thursday Saturday Outpatient palliative care, has significant chronic disease burden   Discharge Diagnoses:  Principal Problem:   Acute on chronic systolic CHF (congestive heart failure) (HCC) Severe pulmonary hypertension   ESRD on dialysis (HCC) Debility, bed/wheelchair bound   CAD (coronary artery disease)   Paroxysmal atrial fibrillation (HCC)   DM (diabetes mellitus), type 2 with renal complications (HCC)   Class 2 obesity due to excess calories with body mass index (BMI) of 36.0 to 36.9 in adult   Hypothyroidism   Hypotension   Discharge Condition: stable  Diet recommendation: DM, Renal  Filed Weights   09/12/23 0815 09/12/23 1251 09/13/23 0246  Weight: 100 kg 97 kg 98 kg    History of present illness:  72/F with chronic systolic CHF, COPD on 2 to 3 L home O2, history of lung cancer sp XRT 2023, CAD, hypertension, type 2 diabetes mellitus, atrial fibrillation presented to the ED with progressive dyspnea on exertion.  Noted to be hypoxic and distress, transported to the ED placed on BiPAP. -Chest x-ray noted cardiomegaly, pulmonary vascular congestion, moderate left pleural effusion and CHF  -Nephrology consulted, dialyzed yesterday -Echo noted worsening cardiomyopathy, cardiology consulting  Hospital Course:   Acute on chronic systolic CHF  Severe pulmonary hypertension -Echo noted worsening cardiomyopathy, EF down to 20-25% with severely elevated PA systolic pressures -Has not been noted to have worsening cardiomyopathy and EF over the last several months, LHC 9/24 with mild nonobstructive CAD -Admitted with volume overload despite compliance with dialysis  -Seen by cardiology in consultation, recommended  conservative management, GDMT limited by CKD 5  -Continue metoprolol and managed with HD -Volume status is improving, cleared by nephrology for discharge home, needs further dry weight lowering as outpatient -prognosis is poor with worsening Cardiomyopathy, Pulm HTN, ESRD, Severe debility and Obesity   ESRD on dialysis (HCC) -Dialyzed yesterday, nephrology following -Midodrine discontinued, blood pressures have been in the 1 40-1 60 range this admission   Acute hypoxemic respiratory failure due to acute pulmonary edema with left pleural effusion. Bronchitis/pneumonia suspected in addition -Also gives history of low-grade fevers, has leukocytosis and elevated procalcitonin, x-ray with retrocardiac opacity, added Augmentin to complete 5-day course -Volume removal with dialysis   Anemia of chronic dz -Hemoglobin stable, continue EPO with HD   CAD (coronary artery disease) No ACS Elevated troponin likely due to volume overload.     Paroxysmal atrial fibrillation (HCC) Continue anticoagulation with apixaban.  Continue metoprolol   DM (diabetes mellitus), type 2  -CBGs are stable, continue current regimen of sliding scale insulin   History of small cell lung cancer -Followed by Dr. Shirline Frees, treated with XRT 2023 -Due for PET scan soon   Class 2 obesity due to excess calories with body mass index (BMI) of 36.0 to 36.9 in adult Calculated BMI is 37    Abnormal TSH -On Synthroid, TSH was normal 2 months ago, now 13.6, I wonder this if this is related to decreased absorption from CHF/bowel edema -Recommend repeating labs in 6 weeks Discharge Exam: Vitals:   09/13/23 0738 09/13/23 0759  BP:  (!) 142/61  Pulse:  100  Resp: 19 20  Temp:  98.6 F (37 C)  SpO2:  90%   General exam: Obese chronically ill female laying in bed,  AAOx3 HEENT: Neck obese unable to assess JVD CVS: S1-S2, regular rhythm Lungs: Decreased breath sounds at the bases Abdomen: Soft, nontender, bowel sounds  present Extremities: Trace edema  Psychiatry:  Mood & affect appropriate.   Discharge Instructions   Discharge Instructions     Diet - low sodium heart healthy   Complete by: As directed    Discharge wound care:   Complete by: As directed    Clearance right forearm wound with wound cleanser, apply 1/4 inch thick layer of Santyl to wound bed, top with piece of saline moist gauze, top with dry gauze and silicone foam.   Increase activity slowly   Complete by: As directed       Allergies as of 09/13/2023       Reactions   Ace Inhibitors Anaphylaxis, Swelling   Angioedema with lisinopril   Hydralazine Other (See Comments)   Patient reported dizziness, headache, visual changes   Nebivolol Swelling   Chest pain (reaction to Bystolic)   Morphine And Codeine Itching   Rosuvastatin Other (See Comments)   myalgias        Medication List     STOP taking these medications    clopidogrel 75 MG tablet Commonly known as: PLAVIX   midodrine 5 MG tablet Commonly known as: PROAMATINE       TAKE these medications    acetaminophen 500 MG tablet Commonly known as: TYLENOL Take 1 tablet (500 mg total) by mouth See admin instructions. Take 1,000mg  (2 tablets) by mouth twice a day in addition to 1,000mg  (2 tablets) twice daily as needed for pain. What changed:  how much to take when to take this additional instructions   albuterol (2.5 MG/3ML) 0.083% nebulizer solution Commonly known as: PROVENTIL Take 3 mLs (2.5 mg total) by nebulization every 6 (six) hours as needed for wheezing or shortness of breath. What changed: when to take this   albuterol 108 (90 Base) MCG/ACT inhaler Commonly known as: VENTOLIN HFA Inhale 2 puffs into the lungs every 6 (six) hours as needed for shortness of breath (COPD). What changed: Another medication with the same name was changed. Make sure you understand how and when to take each.   allopurinol 100 MG tablet Commonly known as:  ZYLOPRIM Take 1 tablet (100 mg total) by mouth daily.   amoxicillin-clavulanate 250-125 MG tablet Commonly known as: AUGMENTIN Take 1 tablet by mouth daily at 6 PM for 3 days.   atorvastatin 20 MG tablet Commonly known as: LIPITOR Take 1 tablet (20 mg total) by mouth daily. Start taking on: September 14, 2023   budesonide-formoterol 160-4.5 MCG/ACT inhaler Commonly known as: SYMBICORT Inhale 2 puffs into the lungs in the morning and at bedtime.   colchicine 0.6 MG tablet Take 0.5 tablets (0.3 mg total) by mouth daily.   diclofenac Sodium 1 % Gel Commonly known as: VOLTAREN Apply 2 g topically 4 (four) times daily. What changed:  how much to take when to take this   docusate sodium 100 MG capsule Commonly known as: COLACE Take 1 capsule (100 mg total) by mouth daily.   Eliquis 5 MG Tabs tablet Generic drug: apixaban Take 1 tablet (5 mg total) by mouth 2 (two) times daily.   fluticasone 50 MCG/ACT nasal spray Commonly known as: FLONASE Place 1 spray into both nostrils daily. What changed: how much to take   insulin lispro 100 UNIT/ML KwikPen Commonly known as: HumaLOG KwikPen 0-15 Units, Subcutaneous, 3 times daily with meal CBG < 70: Implement  Hypoglycemia meausers CBG 70 - 120: 0 units CBG 121 - 150: 2 units CBG 151 - 200: 3 units CBG 201 - 250: 5 units CBG 251 - 300: 8 units CBG 301 - 350: 11 units CBG 351 - 400: 15 units CBG > 400: call MD What changed:  how much to take how to take this when to take this additional instructions   levothyroxine 175 MCG tablet Commonly known as: SYNTHROID Take 1 tablet (175 mcg total) by mouth daily.   lidocaine-prilocaine cream Commonly known as: EMLA Apply 1 Application topically See admin instructions. On dialysis days (Tues,Thurs,Sat)   meclizine 25 MG tablet Commonly known as: ANTIVERT Take 25 mg by mouth as needed for dizziness or nausea.   metoprolol tartrate 25 MG tablet Commonly known as: LOPRESSOR Take 1.5  tablets (37.5 mg total) by mouth 2 (two) times daily.   multivitamins with iron Tabs tablet Take 1 tablet by mouth in the morning.   nitroGLYCERIN 0.4 MG SL tablet Commonly known as: Nitrostat Place 1 tablet (0.4 mg total) under the tongue every 5 (five) minutes as needed for chest pain.   OXYGEN Inhale 3 L into the lungs.   rosuvastatin 20 MG tablet Commonly known as: CRESTOR Take 1 tablet (20 mg total) by mouth daily.   sevelamer carbonate 800 MG tablet Commonly known as: RENVELA Take 2 tablets (1,600 mg total) by mouth 3 (three) times daily with meals.   thiamine 100 MG tablet Commonly known as: VITAMIN B1 Take 1 tablet (100 mg total) by mouth in the morning.               Discharge Care Instructions  (From admission, onward)           Start     Ordered   09/13/23 0000  Discharge wound care:       Comments: Clearance right forearm wound with wound cleanser, apply 1/4 inch thick layer of Santyl to wound bed, top with piece of saline moist gauze, top with dry gauze and silicone foam.   09/13/23 1413           Allergies  Allergen Reactions   Ace Inhibitors Anaphylaxis and Swelling    Angioedema with lisinopril   Hydralazine Other (See Comments)    Patient reported dizziness, headache, visual changes   Nebivolol Swelling    Chest pain (reaction to Bystolic)   Morphine And Codeine Itching   Rosuvastatin Other (See Comments)    myalgias      The results of significant diagnostics from this hospitalization (including imaging, microbiology, ancillary and laboratory) are listed below for reference.    Significant Diagnostic Studies: DG Chest Port 1 View  Result Date: 09/11/2023 CLINICAL DATA:  Shortness of breath.  Evaluate for pleural effusion. EXAM: PORTABLE CHEST 1 VIEW COMPARISON:  Radiographs 09/09/2023 and 08/24/2023.  CT 07/11/2023. FINDINGS: 0506 hours. Stable cardiomegaly and aortic atherosclerosis. Pulmonary vascular congestion and edema appear  mildly improved. There is persistent retrocardiac airspace disease and a probable small left pleural effusion. No evidence of significant right-sided pleural effusion or pneumothorax. The bones appear unchanged. Telemetry leads overlie the chest. IMPRESSION: Mildly improved pulmonary vascular congestion and edema. Persistent retrocardiac airspace disease and probable small left pleural effusion. Electronically Signed   By: Carey Bullocks M.D.   On: 09/11/2023 08:56   ECHOCARDIOGRAM LIMITED  Result Date: 09/10/2023    ECHOCARDIOGRAM LIMITED REPORT   Patient Name:   KYEISHA KOWIS Date of Exam: 09/10/2023 Medical Rec #:  [id number]         Height:       63.0 in Accession #:    [id number]        Weight:       212.5 lb Date of Birth:  06/12/1951          BSA:          1.984 m Patient Age:    72 years          BP:           121/53 mmHg Patient Gender: F                 HR:           66 bpm. Exam Location:  Inpatient Procedure: Limited Echo, Cardiac Doppler, Color Doppler and Intracardiac            Opacification Agent Indications:    CHF Acute Systolic  History:        Patient has prior history of Echocardiogram examinations, most                 recent 04/15/2023. Risk Factors:Diabetes, Dyslipidemia,                 Hypertension, Sleep Apnea and Former Smoker.  Sonographer:    Karma Ganja Referring Phys: [id number] CAROLE N HALL  Sonographer Comments: Technically difficult study due to poor echo windows and patient is obese. Image acquisition challenging due to patient body habitus and Image acquisition challenging due to patient behavioral factors. IMPRESSIONS  1. No LV thrombus noted. Left ventricular ejection fraction, by estimation, is 20 to 25%. The left ventricle has severely decreased function. The left ventricle demonstrates global hypokinesis. The left ventricular internal cavity size was mildly dilated. There is mild concentric left ventricular hypertrophy.  2. Left atrial size was severely dilated.  3.  Moderate pleural effusion in the left lateral region.  4. The mitral valve was not well visualized. Trivial mitral valve regurgitation. No evidence of mitral stenosis.  5. The aortic valve was not well visualized.  6. There is severely elevated pulmonary artery systolic pressure. The estimated right ventricular systolic pressure is 81.3 mmHg.  7. The inferior vena cava is dilated in size with <50% respiratory variability, suggesting right atrial pressure of 15 mmHg. Comparison(s): Unable to view comparison study, LVEF is worse that reported. FINDINGS  Left Ventricle: No LV thrombus noted. Left ventricular ejection fraction, by estimation, is 20 to 25%. The left ventricle has severely decreased function. The left ventricle demonstrates global hypokinesis. Definity contrast agent was given IV to delineate the left ventricular endocardial borders. The left ventricular internal cavity size was mildly dilated. There is mild concentric left ventricular hypertrophy. Right Ventricle: There is severely elevated pulmonary artery systolic pressure. The tricuspid regurgitant velocity is 4.07 m/s, and with an assumed right atrial pressure of 15 mmHg, the estimated right ventricular systolic pressure is 81.3 mmHg. Left Atrium: Left atrial size was severely dilated. Mitral Valve: The mitral valve was not well visualized. Trivial mitral valve regurgitation. No evidence of mitral valve stenosis. Aortic Valve: The aortic valve was not well visualized. Pulmonic Valve: The pulmonic valve was not well visualized. Pulmonic valve regurgitation is mild. No evidence of pulmonic stenosis. Venous: The inferior vena cava is dilated in size with less than 50% respiratory variability, suggesting right atrial pressure of 15 mmHg. Additional Comments: There is a moderate pleural effusion in the left lateral region. Spectral Doppler performed. Color Doppler performed.  LEFT VENTRICLE PLAX 2D LVIDd:         5.90 cm LVIDs:         5.20 cm LV PW:          1.30 cm LV IVS:        1.30 cm LVOT diam:     2.10 cm LVOT Area:     3.46 cm  LV Volumes (MOD) LV vol d, MOD A2C: 95.5 ml LV vol d, MOD A4C: 157.0 ml LV vol s, MOD A2C: 88.8 ml LV vol s, MOD A4C: 129.0 ml LV SV MOD A2C:     6.7 ml LV SV MOD A4C:     157.0 ml LV SV MOD BP:      12.0 ml IVC IVC diam: 2.50 cm LEFT ATRIUM              Index LA diam:        4.40 cm  2.22 cm/m LA Vol (A2C):   119.0 ml 59.97 ml/m LA Vol (A4C):   108.0 ml 54.43 ml/m LA Biplane Vol: 117.0 ml 58.97 ml/m  TRICUSPID VALVE TR Peak grad:   66.3 mmHg TR Vmax:        407.00 cm/s  SHUNTS Systemic Diam: 2.10 cm Riley Lam MD Electronically signed by Riley Lam MD Signature Date/Time: 09/10/2023/1:57:55 PM    Final    DG Chest Port 1 View  Result Date: 09/09/2023 CLINICAL DATA:  Shortness of breath. EXAM: PORTABLE CHEST 1 VIEW COMPARISON:  August 24, 2023 FINDINGS: The right-sided venous catheter seen on the prior study has been removed. There is stable moderate severity cardiac silhouette enlargement. Mild diffusely increased interstitial lung markings are seen with mild to moderate severity prominence of the central pulmonary vasculature. Moderate to marked severity left basilar atelectasis and/or infiltrate is noted. There is a small to moderate size left pleural effusion. No pneumothorax is identified. Multilevel degenerative changes seen throughout the thoracic spine. IMPRESSION: 1. Cardiomegaly with mild to moderate severity pulmonary vascular congestion. 2. Moderate to marked severity left basilar atelectasis and/or infiltrate. 3. Small to moderate size left pleural effusion. Electronically Signed   By: Aram Candela M.D.   On: 09/09/2023 23:56   IR Removal Tun Cv Cath W/O FL  Result Date: 08/29/2023 INDICATION: 72 year old with poor venous access s/p tunneled central venous catheter placement 08/22/23 by Dr. Milford Cage. Patient has completed therapy, request made for removal. EXAM: REMOVAL TUNNELED  CENTRAL VENOUS CATHETER MEDICATIONS: None ANESTHESIA/SEDATION: None FLUOROSCOPY: None COMPLICATIONS: None immediate. PROCEDURE: Informed written consent was obtained from the patient after a thorough discussion of the procedural risks, benefits and alternatives. All questions were addressed. Maximal Sterile Barrier Technique was utilized including caps, mask, sterile gowns, sterile gloves, sterile drape, hand hygiene and skin antiseptic. A timeout was performed prior to the initiation of the procedure. The patient's right chest and catheter was prepped and draped in a normal sterile fashion. Using gentle blunt dissection the cuff of the catheter was exposed and the catheter was removed in it's entirety. Pressure was held till hemostasis was obtained. A sterile dressing was applied. The patient tolerated the procedure well with no immediate complications. IMPRESSION: Successful catheter removal as described above. Performed by: Loyce Dys PA-C Electronically Signed   By: Richarda Overlie M.D.   On: 08/29/2023 15:43   DG Ankle Complete Left  Result Date: 08/26/2023 CLINICAL DATA:  Pain in anterior ankle EXAM: LEFT ANKLE COMPLETE - 3+ VIEW COMPARISON:  04/12/2021 FINDINGS: Frontal, oblique, lateral  views of the left ankle are obtained. The bones are severely osteopenic. No acute fracture, subluxation, or dislocation. Osteoarthritis of the ankle and hindfoot. Mild diffuse subcutaneous edema. Diffuse vascular calcifications. IMPRESSION: 1. Osteopenia.  No acute fracture. 2. Osteoarthritis of the ankle and hindfoot. 3. Diffuse subcutaneous edema. Electronically Signed   By: Sharlet Salina M.D.   On: 08/26/2023 21:37   DG ESOPHAGUS W SINGLE CM (SOL OR THIN BA)  Result Date: 08/26/2023 CLINICAL DATA:  Dysphagia with concern for stricture. EXAM: ESOPHAGUS/BARIUM SWALLOW/TABLET STUDY TECHNIQUE: Single contrast examination was performed using thin liquid barium. This exam was performed by Corrin Parker, PA-C, and was  supervised and interpreted by Dr. Leanna Battles. FLUOROSCOPY: Radiation Exposure Index (as provided by the fluoroscopic device): 29.0 mGy Kerma COMPARISON:  None Available. FINDINGS: Swallowing: Appears normal. No vestibular penetration or aspiration seen. Pharynx: Unremarkable. Esophagus: No esophageal fold thickening. There is a stricture at the gastroesophageal junction. Esophageal motility: Present. Hiatal Hernia: None. Gastroesophageal reflux: None. Ingested 13mm barium tablet: Not given due to stricture. Other: Exam limited secondary to patient's immobility. 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 Associated dysmotility. Electronically Signed   By: Leanna Battles M.D.   On: 08/26/2023 16:48   DG Chest Port 1 View  Result Date: 08/24/2023 CLINICAL DATA:  Shortness of breath EXAM: PORTABLE CHEST 1 VIEW COMPARISON:  08/23/2023 FINDINGS: Right IJ central line remains in place. Stable cardiomegaly. Pulmonary vascular congestion. Similar perihilar and bibasilar interstitial opacities. Small bilateral pleural effusions, similar to slightly increased. No appreciable pneumothorax. IMPRESSION: 1. Cardiomegaly with pulmonary vascular congestion and interstitial edema. 2. Small bilateral pleural effusions, similar to slightly increased. Electronically Signed   By: Duanne Guess D.O.   On: 08/24/2023 11:32   DG Chest Port 1V same Day  Result Date: 08/23/2023 CLINICAL DATA:  [id number] with shortness of breath. EXAM: PORTABLE CHEST 1 VIEW COMPARISON:  Portable chest yesterday at 8:18 a.m. FINDINGS: 7:01 a.m. There is a new right IJ infusion catheter with the tip about the superior cavoatrial junction. There is no pneumothorax. There are stable dense left lower lobe airspace disease and small left pleural effusion. The heart is enlarged. Perihilar vascular congestion and mild central edema are still seen but improved, with regression of subpleural edema from the peripheral lungs to the  center. The right sulci are sharp. The mediastinum is stable with aortic atherosclerosis. Patient rotated to the left. IMPRESSION: 1. New right IJ infusion catheter with the tip about the superior cavoatrial junction. No pneumothorax. 2. Cardiomegaly with perihilar vascular congestion and mild central edema, improved. 3. Stable dense left lower lobe airspace disease and small left pleural effusion. Electronically Signed   By: Almira Bar M.D.   On: 08/23/2023 07:28   IR Fluoro Guide CV Line Right  Result Date: 08/22/2023 INDICATION: Poor IV access.  ESRD on HD via a fistula.  Rapid response. EXAM: ULTRASOUND AND FLUOROSCOPIC GUIDED PLACEMENT OF TUNNELED CENTRAL VENOUS CATHETER MEDICATIONS: None. ANESTHESIA/SEDATION: Local anesthetic was administered. The patient was continuously monitored during the procedure by the interventional radiology nurse under my direct supervision. FLUOROSCOPY TIME:  Fluoroscopic dose; 2 mGy COMPLICATIONS: None immediate. PROCEDURE: Informed written consent was obtained from the patient and/or patient's representative after a discussion of the risks, benefits, and alternatives to treatment. Questions regarding the procedure were encouraged and answered. The RIGHT neck and chest were prepped with chlorhexidine in a sterile fashion, and a sterile drape was applied covering the operative field. Maximum barrier sterile technique with sterile gowns and  gloves were used for the procedure. A timeout was performed prior to the initiation of the procedure. After the overlying soft tissues were anesthetized, a small venotomy incision was created and a micropuncture kit was utilized to access the internal jugular vein. Real-time ultrasound guidance was utilized for vascular access including the acquisition of a permanent ultrasound image documenting patency of the accessed vessel. The microwire was utilized to measure appropriate catheter length. The micropuncture sheath was exchanged for a  peel-away sheath over a guidewire. A 5 Fr dual lumen tunneled central venous catheter measuring 22 cm was tunneled in a retrograde fashion from the anterior chest wall to the venotomy incision. The catheter was then placed through the peel-away sheath with tip ultimately positioned at the superior caval-atrial junction. Final catheter positioning was confirmed and documented with a spot radiographic image. The catheter aspirates and flushes normally. The catheter was flushed with appropriate volume heparin dwells. The catheter exit site was secured with a 0-Prolene retention suture. The venotomy incision was closed with Dermabond. Dressings were applied. The patient tolerated the procedure well without immediate post procedural complication. FINDINGS: After catheter placement, the tip lies within the superior cavoatrial junction. The catheter aspirates and flushes normally and is ready for immediate use. IMPRESSION: Successful placement of 22 cm dual lumen tunneled central venous "Powerline" catheter via the RIGHT internal jugular vein The tip of the catheter is positioned at the superior cavo-atrial junction. The catheter is ready for immediate use. Roanna Banning, MD [facility] Electronically Signed   By: Roanna Banning M.D.   On: 08/22/2023 14:57   IR US Guide Vasc Access Right  Result Date: 08/22/2023 INDICATION: Poor IV access.  ESRD on HD via a fistula.  Rapid response. EXAM: ULTRASOUND AND FLUOROSCOPIC GUIDED PLACEMENT OF TUNNELED CENTRAL VENOUS CATHETER MEDICATIONS: None. ANESTHESIA/SEDATION: Local anesthetic was administered. The patient was continuously monitored during the procedure by the interventional radiology nurse under my direct supervision. FLUOROSCOPY TIME:  Fluoroscopic dose; 2 mGy COMPLICATIONS: None immediate. PROCEDURE: Informed written consent was obtained from the patient and/or patient's representative after a discussion of the  risks, benefits, and alternatives to treatment. Questions regarding the procedure were encouraged and answered. The RIGHT neck and chest were prepped with chlorhexidine in a sterile fashion, and a sterile drape was applied covering the operative field. Maximum barrier sterile technique with sterile gowns and gloves were used for the procedure. A timeout was performed prior to the initiation of the procedure. After the overlying soft tissues were anesthetized, a small venotomy incision was created and a micropuncture kit was utilized to access the internal jugular vein. Real-time ultrasound guidance was utilized for vascular access including the acquisition of a permanent ultrasound image documenting patency of the accessed vessel. The microwire was utilized to measure appropriate catheter length. The micropuncture sheath was exchanged for a peel-away sheath over a guidewire. A 5 Fr dual lumen tunneled central venous catheter measuring 22 cm was tunneled in a retrograde fashion from the anterior chest wall to the venotomy incision. The catheter was then placed through the peel-away sheath with tip ultimately positioned at the superior caval-atrial junction. Final catheter positioning was confirmed and documented with a spot radiographic image. The catheter aspirates and flushes normally. The catheter was flushed with appropriate volume heparin dwells. The catheter exit site was secured with a 0-Prolene retention suture. The venotomy incision was closed with Dermabond. Dressings were applied. The patient tolerated the procedure well without immediate post procedural complication. FINDINGS:  After catheter placement, the tip lies within the superior cavoatrial junction. The catheter aspirates and flushes normally and is ready for immediate use. IMPRESSION: Successful placement of 22 cm dual lumen tunneled central venous "Powerline" catheter via the RIGHT internal jugular vein The tip of the catheter is positioned at the  superior cavo-atrial junction. The catheter is ready for immediate use. Roanna Banning, MD [facility] Radiology Electronically Signed   By: Roanna Banning M.D.   On: 08/22/2023 14:57   DG Chest Port 1 View  Result Date: 08/22/2023 CLINICAL DATA:  62130 Acute respiratory distress [id number] EXAM: PORTABLE CHEST 1 VIEW COMPARISON:  August 20, 2023 FINDINGS: Marked cardiomegaly. There is increased perihilar and interstitial density. Similar left retrocardiac consolidation. No acute osseous abnormality. IMPRESSION: 1. Cardiomegaly with worsening perihilar and interstitial densities, suggestive of worsening pulmonary edema. 2. Similar left retrocardiac density, with considerations including atelectasis/consolidation versus effusion. Electronically Signed   By: Olive Bass M.D.   On: 08/22/2023 09:16   DG Chest Portable 1 View  Result Date: 08/20/2023 CLINICAL DATA:  Shortness of breath. EXAM: PORTABLE CHEST 1 VIEW COMPARISON:  Portable chest 07/16/2023, portable chest 07/12/2023. FINDINGS: Severe cardiomegaly is again noted. Central vascular prominence is seen but less pronounced than previously. There is no overt edema. Opacity is again noted in the retrocardiac area, which could be due to compressive atelectasis from the enlarged heart or could be due to left lower lobe consolidation. There is no measurable pleural effusion. The mediastinum is stable. Osteopenia and thoracic spondylosis. IMPRESSION: 1. Severe cardiomegaly with central vascular prominence but less pronounced than previously. No overt edema. 2. Opacity in the retrocardiac area could be due to compressive atelectasis from the enlarged heart or could be due to left lower lobe consolidation. Electronically Signed   By: Almira Bar M.D.   On: 08/20/2023 07:14    Microbiology: No results found for this or any previous visit (from the past 240 hour(s)).   Labs: Basic Metabolic Panel: Recent Labs   Lab 09/09/23 2149 09/09/23 2152 09/10/23 0534 09/10/23 0540 09/12/23 0238 09/13/23 0238  NA 142 138 140 137 137 134*  K 5.7* 5.6* 6.0* 5.8* 5.2* 4.0  CL 98  --  98  --  94* 93*  CO2 25  --  22  --  26 28  GLUCOSE 198*  --  205*  --  114* 149*  BUN 105*  --  106*  --  70* 34*  CREATININE 8.09*  --  8.31*  --  6.87* 4.55*  CALCIUM 10.1  --  10.0  --  9.5 9.5  PHOS  --   --  5.7*  --   --   --    Liver Function Tests: Recent Labs  Lab 09/09/23 2149 09/10/23 0534 09/12/23 0238  AST 27  --  19  ALT 17  --  14  ALKPHOS 102  --  50  BILITOT 0.6  --  0.6  PROT 6.7  --  6.1*  ALBUMIN 3.3* 3.3* 3.4*   No results for input(s): "LIPASE", "AMYLASE" in the last 168 hours. No results for input(s): "AMMONIA" in the last 168 hours. CBC: Recent Labs  Lab 09/09/23 2149 09/09/23 2152 09/10/23 0534 09/10/23 0540 09/12/23 0238 09/12/23 1527 09/13/23 0238  WBC 16.7*  --  15.0*  --  8.3 9.2 7.9  NEUTROABS 14.3*  --   --   --   --   --   --   HGB  9.2*   < > 9.1* 10.5* 7.1* 8.0* 8.2*  HCT 31.1*   < > 32.2* 31.0* 24.7* 26.5* 28.5*  MCV 105.4*  --  107.7*  --  104.7* 104.7* 106.3*  PLT 239  --  226  --  195 201 201   < > = values in this interval not displayed.   Cardiac Enzymes: No results for input(s): "CKTOTAL", "CKMB", "CKMBINDEX", "TROPONINI" in the last 168 hours. BNP: BNP (last 3 results) Recent Labs    08/20/23 0456 09/09/23 2149 09/11/23 0226  BNP 1,345.3* 1,611.2* 2,579.6*    ProBNP (last 3 results) No results for input(s): "PROBNP" in the last 8760 hours.  CBG: Recent Labs  Lab 09/12/23 1727 09/12/23 2031 09/13/23 0516 09/13/23 0559 09/13/23 1102  GLUCAP 163* 153* 125* 133* 168*       Signed:  Zannie Cove MD.  [facility] 09/13/2023, 2:13 PM

## 2023-09-13 NOTE — Progress Notes (Addendum)
[facility] NEPHROLOGY PROGRESS NOTE  Assessment/ Plan: Pt is a 72 y.o. yo female  with ESRD on HD TTS at [place].  Past medical history significant for non obstructed CAD, chronic combined CHF, Afib, OSA, mild aortic stenosis, HTN, HLD, hyperthyroidism, DMT2, PVD, TIA, O2 dependent COPD, Hx non small cell lung cancer s/p radiation/in remission and obesity, presented with shortness of breath and hypoxia. Dialysis Orders:   [facility]  4h   400/600   96.5kg  2/2 bath  LUA AVG  Heparin 2000 + 2000 intermittent - hectorol 3 mcg  - mircera 150 mcg IV q 2wks - last 11/30  # Respiratory distress with hypoxia -due to pulmonary edema and pleural effusion.  Received dialysis, 6 L UF with HD yesterday.  Respiratory status much improved now.  Back to her baseline.  #ESRD -  on HD TTS.  Tolerating UF well.  Plan for regular dialysis tomorrow.  This can be done outpatient if she is being discharged. #Moderate L pleural effusion - May require thoracentesis, defer to pulmonary team. #Acute on chronic combined HF/Elevated troponin -  UF as tolerated. Cardiology following. #Leukocytosis - WBC improved.  Currently on Augmentin per primary team.    #Hypertension  -blood pressure variable, Monitor BP. #Anemia of CKD -  ESA recently dosed.  #Secondary Hyperparathyroidism -  Ca elevated. Phos close to goal.  Continue binders when eating. Hold VDRA for now.  # Hyperkalemia: Managed with dialysis, low potassium diet. #Nutrition - Renal diet w/fluid restrictions.   Discussed with the primary team.  Subjective: Seen and examined at bedside.  Tolerated dialysis well yesterday.  She reports her breathing is much better.   Some nausea overnight but feels much better now denies nausea, vomiting, chest pain.  No new event.  Objective Vital signs in last 24 hours: Vitals:   09/13/23 0600 09/13/23 0601 09/13/23 0738 09/13/23 0759  BP: (!) 159/74   (!) 142/61  Pulse: 97 97  100  Resp: (!) 23 20 19 20    Temp: 98.4 F (36.9 C)   98.6 F (37 C)  TempSrc: Oral   Oral  SpO2: 98% 100%  90%  Weight:      Height:       Weight change:   Intake/Output Summary (Last 24 hours) at 09/13/2023 1120 Last data filed at 09/12/2023 1700 Gross per 24 hour  Intake 60 ml  Output 6000 ml  Net -5940 ml       Labs: RENAL PANEL Recent Labs  Lab 09/09/23 2149 09/09/23 2152 09/10/23 0534 09/10/23 0540 09/12/23 0238 09/13/23 0238  NA 142 138 140 137 137 134*  K 5.7* 5.6* 6.0* 5.8* 5.2* 4.0  CL 98  --  98  --  94* 93*  CO2 25  --  22  --  26 28  GLUCOSE 198*  --  205*  --  114* 149*  BUN 105*  --  106*  --  70* 34*  CREATININE 8.09*  --  8.31*  --  6.87* 4.55*  CALCIUM 10.1  --  10.0  --  9.5 9.5  PHOS  --   --  5.7*  --   --   --   ALBUMIN 3.3*  --  3.3*  --  3.4*  --     Liver Function Tests: Recent Labs  Lab 09/09/23 2149 09/10/23 0534 09/12/23 0238  AST 27  --  19  ALT 17  --  14  ALKPHOS 102  --  50  BILITOT 0.6  --  0.6  PROT 6.7  --  6.1*  ALBUMIN 3.3* 3.3* 3.4*   No results for input(s): "LIPASE", "AMYLASE" in the last 168 hours. No results for input(s): "AMMONIA" in the last 168 hours. CBC: Recent Labs    09/10/23 0534 09/10/23 0540 09/12/23 0238 09/12/23 1527 09/13/23 0238  HGB 9.1* 10.5* 7.1* 8.0* 8.2*  MCV 107.7*  --  104.7* 104.7* 106.3*    Cardiac Enzymes: No results for input(s): "CKTOTAL", "CKMB", "CKMBINDEX", "TROPONINI" in the last 168 hours. CBG: Recent Labs  Lab 09/12/23 1727 09/12/23 2031 09/13/23 0516 09/13/23 0559 09/13/23 1102  GLUCAP 163* 153* 125* 133* 168*    Iron Studies: No results for input(s): "IRON", "TIBC", "TRANSFERRIN", "FERRITIN" in the last 72 hours. Studies/Results: No results found.  Medications: Infusions:  albumin human      Scheduled Medications:  amoxicillin-clavulanate  1 tablet Oral q1800   arformoterol  15 mcg Nebulization Q12H   atorvastatin  20 mg Oral Daily   budesonide (PULMICORT) nebulizer solution   0.5 mg Nebulization BID   Chlorhexidine Gluconate Cloth  6 each Topical Q0600   Chlorhexidine Gluconate Cloth  6 each Topical Q0600   collagenase   Topical Daily   heparin injection (subcutaneous)  5,000 Units Subcutaneous Q8H   insulin aspart  0-6 Units Subcutaneous TID WC   levothyroxine  175 mcg Oral Daily   midodrine  15 mg Oral TID WC   multivitamins with iron  1 tablet Oral q AM   revefenacin  175 mcg Nebulization Daily   sevelamer carbonate  1,600 mg Oral TID WC   thiamine  100 mg Oral q AM    have reviewed scheduled and prn medications.  Physical Exam: General:NAD, looks comfortable. Heart:RRR, s1s2 nl Lungs: Distant but sound, no wheezing Abdomen:soft, Non-tender, non-distended Extremities:No edema Dialysis Access: Left upper extremity AV graft.  Jazminn Pomales Prasad Gunhild Bautch 09/13/2023,11:20 AM  LOS: 3 days

## 2023-09-13 NOTE — Progress Notes (Signed)
Pt. Off cpap tonight due to throwing up earlier in the day. Pt. Is currently resting comfortable on [place] 2L.

## 2023-09-13 NOTE — Care Management Important Message (Signed)
Important Message  Patient Details  Name: [name] [name] MRN: [id number] Date of Birth: 11/14/1950   Important Message Given:  Yes - Medicare IM     Dorena Bodo 09/13/2023, 3:07 PM

## 2023-09-17 ENCOUNTER — Encounter (HOSPITAL_COMMUNITY): Payer: Self-pay

## 2023-09-18 ENCOUNTER — Inpatient Hospital Stay: Payer: No Typology Code available for payment source | Attending: Physician Assistant

## 2023-09-18 ENCOUNTER — Ambulatory Visit (HOSPITAL_COMMUNITY): Payer: No Typology Code available for payment source

## 2023-09-18 NOTE — Progress Notes (Signed)
Spoke with patient in regards to appts.  Per [place], [place]- Spoke with radiology about the PET. She only needs the PET not the CT scan.   Moved patients appt from 12/18 to 12/30 @ 1030 after the PET scan.  Canceled the CT chest. Pt verbalized understanding.

## 2023-09-22 ENCOUNTER — Inpatient Hospital Stay (HOSPITAL_COMMUNITY)
Admission: EM | Admit: 2023-09-22 | Discharge: 2023-10-09 | DRG: 640 | Disposition: E | Payer: 59 | Attending: Family Medicine | Admitting: Family Medicine

## 2023-09-22 ENCOUNTER — Encounter (HOSPITAL_COMMUNITY): Payer: Self-pay

## 2023-09-22 ENCOUNTER — Other Ambulatory Visit: Payer: Self-pay

## 2023-09-22 ENCOUNTER — Emergency Department (HOSPITAL_COMMUNITY): Payer: 59

## 2023-09-22 DIAGNOSIS — E669 Obesity, unspecified: Secondary | ICD-10-CM | POA: Insufficient documentation

## 2023-09-22 DIAGNOSIS — Z7189 Other specified counseling: Secondary | ICD-10-CM | POA: Diagnosis not present

## 2023-09-22 DIAGNOSIS — E8729 Other acidosis: Secondary | ICD-10-CM | POA: Diagnosis present

## 2023-09-22 DIAGNOSIS — Z66 Do not resuscitate: Secondary | ICD-10-CM | POA: Diagnosis present

## 2023-09-22 DIAGNOSIS — R54 Age-related physical debility: Secondary | ICD-10-CM | POA: Diagnosis present

## 2023-09-22 DIAGNOSIS — Z9981 Dependence on supplemental oxygen: Secondary | ICD-10-CM | POA: Diagnosis not present

## 2023-09-22 DIAGNOSIS — I480 Paroxysmal atrial fibrillation: Secondary | ICD-10-CM | POA: Diagnosis present

## 2023-09-22 DIAGNOSIS — Z85118 Personal history of other malignant neoplasm of bronchus and lung: Secondary | ICD-10-CM

## 2023-09-22 DIAGNOSIS — G9341 Metabolic encephalopathy: Secondary | ICD-10-CM | POA: Diagnosis not present

## 2023-09-22 DIAGNOSIS — Z6834 Body mass index (BMI) 34.0-34.9, adult: Secondary | ICD-10-CM | POA: Diagnosis not present

## 2023-09-22 DIAGNOSIS — N2581 Secondary hyperparathyroidism of renal origin: Secondary | ICD-10-CM | POA: Diagnosis present

## 2023-09-22 DIAGNOSIS — E1022 Type 1 diabetes mellitus with diabetic chronic kidney disease: Secondary | ICD-10-CM | POA: Diagnosis present

## 2023-09-22 DIAGNOSIS — E1051 Type 1 diabetes mellitus with diabetic peripheral angiopathy without gangrene: Secondary | ICD-10-CM | POA: Diagnosis present

## 2023-09-22 DIAGNOSIS — N186 End stage renal disease: Secondary | ICD-10-CM | POA: Diagnosis present

## 2023-09-22 DIAGNOSIS — Z515 Encounter for palliative care: Secondary | ICD-10-CM

## 2023-09-22 DIAGNOSIS — D631 Anemia in chronic kidney disease: Secondary | ICD-10-CM | POA: Diagnosis present

## 2023-09-22 DIAGNOSIS — Z992 Dependence on renal dialysis: Secondary | ICD-10-CM | POA: Diagnosis not present

## 2023-09-22 DIAGNOSIS — Z7951 Long term (current) use of inhaled steroids: Secondary | ICD-10-CM

## 2023-09-22 DIAGNOSIS — I5022 Chronic systolic (congestive) heart failure: Secondary | ICD-10-CM | POA: Diagnosis present

## 2023-09-22 DIAGNOSIS — Z888 Allergy status to other drugs, medicaments and biological substances status: Secondary | ICD-10-CM

## 2023-09-22 DIAGNOSIS — E8770 Fluid overload, unspecified: Secondary | ICD-10-CM | POA: Diagnosis present

## 2023-09-22 DIAGNOSIS — Z96641 Presence of right artificial hip joint: Secondary | ICD-10-CM | POA: Diagnosis present

## 2023-09-22 DIAGNOSIS — J9601 Acute respiratory failure with hypoxia: Principal | ICD-10-CM | POA: Diagnosis present

## 2023-09-22 DIAGNOSIS — E785 Hyperlipidemia, unspecified: Secondary | ICD-10-CM | POA: Diagnosis present

## 2023-09-22 DIAGNOSIS — Z8249 Family history of ischemic heart disease and other diseases of the circulatory system: Secondary | ICD-10-CM

## 2023-09-22 DIAGNOSIS — E66811 Obesity, class 1: Secondary | ICD-10-CM | POA: Diagnosis present

## 2023-09-22 DIAGNOSIS — Z794 Long term (current) use of insulin: Secondary | ICD-10-CM | POA: Diagnosis not present

## 2023-09-22 DIAGNOSIS — Z90710 Acquired absence of both cervix and uterus: Secondary | ICD-10-CM

## 2023-09-22 DIAGNOSIS — J4489 Other specified chronic obstructive pulmonary disease: Secondary | ICD-10-CM | POA: Diagnosis present

## 2023-09-22 DIAGNOSIS — Z87891 Personal history of nicotine dependence: Secondary | ICD-10-CM

## 2023-09-22 DIAGNOSIS — I509 Heart failure, unspecified: Secondary | ICD-10-CM

## 2023-09-22 DIAGNOSIS — Z8673 Personal history of transient ischemic attack (TIA), and cerebral infarction without residual deficits: Secondary | ICD-10-CM

## 2023-09-22 DIAGNOSIS — I2510 Atherosclerotic heart disease of native coronary artery without angina pectoris: Secondary | ICD-10-CM | POA: Diagnosis present

## 2023-09-22 DIAGNOSIS — E890 Postprocedural hypothyroidism: Secondary | ICD-10-CM | POA: Diagnosis present

## 2023-09-22 DIAGNOSIS — D72829 Elevated white blood cell count, unspecified: Secondary | ICD-10-CM | POA: Diagnosis present

## 2023-09-22 DIAGNOSIS — I272 Pulmonary hypertension, unspecified: Secondary | ICD-10-CM | POA: Diagnosis present

## 2023-09-22 DIAGNOSIS — I5043 Acute on chronic combined systolic (congestive) and diastolic (congestive) heart failure: Secondary | ICD-10-CM

## 2023-09-22 DIAGNOSIS — Z885 Allergy status to narcotic agent status: Secondary | ICD-10-CM

## 2023-09-22 DIAGNOSIS — Z833 Family history of diabetes mellitus: Secondary | ICD-10-CM

## 2023-09-22 DIAGNOSIS — E662 Morbid (severe) obesity with alveolar hypoventilation: Secondary | ICD-10-CM | POA: Diagnosis present

## 2023-09-22 DIAGNOSIS — I132 Hypertensive heart and chronic kidney disease with heart failure and with stage 5 chronic kidney disease, or end stage renal disease: Secondary | ICD-10-CM | POA: Diagnosis present

## 2023-09-22 DIAGNOSIS — J9690 Respiratory failure, unspecified, unspecified whether with hypoxia or hypercapnia: Secondary | ICD-10-CM | POA: Diagnosis present

## 2023-09-22 DIAGNOSIS — R000 Tachycardia, unspecified: Secondary | ICD-10-CM | POA: Diagnosis present

## 2023-09-22 DIAGNOSIS — J9602 Acute respiratory failure with hypercapnia: Secondary | ICD-10-CM | POA: Diagnosis not present

## 2023-09-22 DIAGNOSIS — Z8614 Personal history of Methicillin resistant Staphylococcus aureus infection: Secondary | ICD-10-CM

## 2023-09-22 DIAGNOSIS — J9621 Acute and chronic respiratory failure with hypoxia: Secondary | ICD-10-CM | POA: Diagnosis present

## 2023-09-22 DIAGNOSIS — I959 Hypotension, unspecified: Secondary | ICD-10-CM | POA: Diagnosis not present

## 2023-09-22 DIAGNOSIS — Z79890 Hormone replacement therapy (postmenopausal): Secondary | ICD-10-CM

## 2023-09-22 DIAGNOSIS — Z7901 Long term (current) use of anticoagulants: Secondary | ICD-10-CM

## 2023-09-22 DIAGNOSIS — Z79899 Other long term (current) drug therapy: Secondary | ICD-10-CM

## 2023-09-22 DIAGNOSIS — J9622 Acute and chronic respiratory failure with hypercapnia: Secondary | ICD-10-CM | POA: Diagnosis present

## 2023-09-22 DIAGNOSIS — M898X9 Other specified disorders of bone, unspecified site: Secondary | ICD-10-CM | POA: Diagnosis present

## 2023-09-22 DIAGNOSIS — E119 Type 2 diabetes mellitus without complications: Secondary | ICD-10-CM

## 2023-09-22 DIAGNOSIS — G894 Chronic pain syndrome: Secondary | ICD-10-CM | POA: Diagnosis present

## 2023-09-22 DIAGNOSIS — I5020 Unspecified systolic (congestive) heart failure: Secondary | ICD-10-CM | POA: Insufficient documentation

## 2023-09-22 LAB — CBC WITH DIFFERENTIAL/PLATELET
Abs Immature Granulocytes: 0 10*3/uL (ref 0.00–0.07)
Basophils Absolute: 0 10*3/uL (ref 0.0–0.1)
Basophils Relative: 0 %
Eosinophils Absolute: 0 10*3/uL (ref 0.0–0.5)
Eosinophils Relative: 0 %
HCT: 34.1 % — ABNORMAL LOW (ref 36.0–46.0)
Hemoglobin: 9.3 g/dL — ABNORMAL LOW (ref 12.0–15.0)
Lymphocytes Relative: 15 %
Lymphs Abs: 1.3 10*3/uL (ref 0.7–4.0)
MCH: 30.5 pg (ref 26.0–34.0)
MCHC: 27.3 g/dL — ABNORMAL LOW (ref 30.0–36.0)
MCV: 111.8 fL — ABNORMAL HIGH (ref 80.0–100.0)
Monocytes Absolute: 0.5 10*3/uL (ref 0.1–1.0)
Monocytes Relative: 6 %
Neutro Abs: 7 10*3/uL (ref 1.7–7.7)
Neutrophils Relative %: 79 %
Platelets: 292 10*3/uL (ref 150–400)
RBC: 3.05 MIL/uL — ABNORMAL LOW (ref 3.87–5.11)
RDW: 16.5 % — ABNORMAL HIGH (ref 11.5–15.5)
WBC: 8.8 10*3/uL (ref 4.0–10.5)
nRBC: 0.8 % — ABNORMAL HIGH (ref 0.0–0.2)
nRBC: 1 /100{WBCs} — ABNORMAL HIGH

## 2023-09-22 LAB — I-STAT VENOUS BLOOD GAS, ED
Acid-Base Excess: 4 mmol/L — ABNORMAL HIGH (ref 0.0–2.0)
Acid-Base Excess: 3 mmol/L — ABNORMAL HIGH (ref 0.0–2.0)
Acid-base deficit: 8 mmol/L — ABNORMAL HIGH (ref 0.0–2.0)
Bicarbonate: 33.4 mmol/L — ABNORMAL HIGH (ref 20.0–28.0)
Bicarbonate: 21.6 mmol/L (ref 20.0–28.0)
Bicarbonate: 25.6 mmol/L (ref 20.0–28.0)
Calcium, Ion: 1.2 mmol/L (ref 1.15–1.40)
Calcium, Ion: 0.95 mmol/L — ABNORMAL LOW (ref 1.15–1.40)
Calcium, Ion: 0.77 mmol/L — CL (ref 1.15–1.40)
HCT: 33 % — ABNORMAL LOW (ref 36.0–46.0)
HCT: 21 % — ABNORMAL LOW (ref 36.0–46.0)
HCT: 35 % — ABNORMAL LOW (ref 36.0–46.0)
Hemoglobin: 11.2 g/dL — ABNORMAL LOW (ref 12.0–15.0)
Hemoglobin: 7.1 g/dL — ABNORMAL LOW (ref 12.0–15.0)
Hemoglobin: 11.9 g/dL — ABNORMAL LOW (ref 12.0–15.0)
O2 Saturation: 99 %
O2 Saturation: 65 %
O2 Saturation: 100 %
Potassium: 4 mmol/L (ref 3.5–5.1)
Potassium: 2.5 mmol/L — CL (ref 3.5–5.1)
Potassium: 5.4 mmol/L — ABNORMAL HIGH (ref 3.5–5.1)
Sodium: 138 mmol/L (ref 135–145)
Sodium: 147 mmol/L — ABNORMAL HIGH (ref 135–145)
Sodium: 128 mmol/L — ABNORMAL LOW (ref 135–145)
TCO2: 36 mmol/L — ABNORMAL HIGH (ref 22–32)
TCO2: 24 mmol/L (ref 22–32)
TCO2: 27 mmol/L (ref 22–32)
pCO2, Ven: 77.1 mm[Hg] (ref 44–60)
pCO2, Ven: 71.2 mm[Hg] (ref 44–60)
pCO2, Ven: 33.5 mm[Hg] — ABNORMAL LOW (ref 44–60)
pH, Ven: 7.244 — ABNORMAL LOW (ref 7.25–7.43)
pH, Ven: 7.09 — CL (ref 7.25–7.43)
pH, Ven: 7.491 — ABNORMAL HIGH (ref 7.25–7.43)
pO2, Ven: 159 mm[Hg] — ABNORMAL HIGH (ref 32–45)
pO2, Ven: 47 mm[Hg] — ABNORMAL HIGH (ref 32–45)
pO2, Ven: 183 mm[Hg] — ABNORMAL HIGH (ref 32–45)

## 2023-09-22 LAB — I-STAT ARTERIAL BLOOD GAS, ED
Acid-Base Excess: 2 mmol/L (ref 0.0–2.0)
Bicarbonate: 28 mmol/L (ref 20.0–28.0)
Calcium, Ion: 1.2 mmol/L (ref 1.15–1.40)
HCT: 35 % — ABNORMAL LOW (ref 36.0–46.0)
Hemoglobin: 11.9 g/dL — ABNORMAL LOW (ref 12.0–15.0)
O2 Saturation: 97 %
Potassium: 3.8 mmol/L (ref 3.5–5.1)
Sodium: 133 mmol/L — ABNORMAL LOW (ref 135–145)
TCO2: 30 mmol/L (ref 22–32)
pCO2 arterial: 49.9 mm[Hg] — ABNORMAL HIGH (ref 32–48)
pH, Arterial: 7.357 (ref 7.35–7.45)
pO2, Arterial: 92 mm[Hg] (ref 83–108)

## 2023-09-22 LAB — COMPREHENSIVE METABOLIC PANEL
ALT: 12 U/L (ref 0–44)
AST: 20 U/L (ref 15–41)
Albumin: 3.4 g/dL — ABNORMAL LOW (ref 3.5–5.0)
Alkaline Phosphatase: 68 U/L (ref 38–126)
Anion gap: 12 (ref 5–15)
BUN: 27 mg/dL — ABNORMAL HIGH (ref 8–23)
CO2: 29 mmol/L (ref 22–32)
Calcium: 9.7 mg/dL (ref 8.9–10.3)
Chloride: 96 mmol/L — ABNORMAL LOW (ref 98–111)
Creatinine, Ser: 4.63 mg/dL — ABNORMAL HIGH (ref 0.44–1.00)
GFR, Estimated: 10 mL/min — ABNORMAL LOW (ref >60)
Glucose, Bld: 265 mg/dL — ABNORMAL HIGH (ref 70–99)
Potassium: 4 mmol/L (ref 3.5–5.1)
Sodium: 137 mmol/L (ref 135–145)
Total Bilirubin: 0.5 mg/dL (ref <1.2)
Total Protein: 7 g/dL (ref 6.5–8.1)

## 2023-09-22 LAB — BRAIN NATRIURETIC PEPTIDE: B Natriuretic Peptide: 2297 pg/mL — ABNORMAL HIGH (ref 0.0–100.0)

## 2023-09-22 LAB — TROPONIN I (HIGH SENSITIVITY): Troponin I (High Sensitivity): 102 ng/L (ref <18)

## 2023-09-22 LAB — GLUCOSE, CAPILLARY: Glucose-Capillary: 142 mg/dL — ABNORMAL HIGH (ref 70–99)

## 2023-09-22 MED ORDER — NEPRO/CARBSTEADY PO LIQD
237.0000 mL | ORAL | Status: DC | PRN
Start: 2023-09-22 — End: 2023-09-25

## 2023-09-22 MED ORDER — APIXABAN 5 MG PO TABS
5.0000 mg | ORAL_TABLET | Freq: Two times a day (BID) | ORAL | Status: DC
  Administered 2023-09-23 – 2023-09-27 (×8): 5 mg via ORAL
  Filled 2023-09-22 (×10): qty 1

## 2023-09-22 MED ORDER — ANTICOAGULANT SODIUM CITRATE 4% (200MG/5ML) IV SOLN: 5.0000 mL | Status: DC | PRN

## 2023-09-22 MED ORDER — INSULIN ASPART 100 UNIT/ML IJ SOLN
0.0000 [IU] | Freq: Three times a day (TID) | INTRAMUSCULAR | Status: DC
  Administered 2023-09-23 (×2): 4 [IU] via SUBCUTANEOUS

## 2023-09-22 MED ORDER — THIAMINE HCL 100 MG PO TABS: 100.0000 mg | ORAL_TABLET | Freq: Every morning | ORAL | Status: DC

## 2023-09-22 MED ORDER — CLOPIDOGREL BISULFATE 75 MG PO TABS
75.0000 mg | ORAL_TABLET | Freq: Every day | ORAL | Status: DC
  Administered 2023-09-22 – 2023-09-27 (×5): 75 mg via ORAL
  Filled 2023-09-22 (×7): qty 1

## 2023-09-22 MED ORDER — HEPARIN SODIUM (PORCINE) 1000 UNIT/ML DIALYSIS
2000.0000 [IU] | Freq: Once | INTRAMUSCULAR | Status: AC
  Administered 2023-09-22: 2000 [IU] via INTRAVENOUS_CENTRAL

## 2023-09-22 MED ORDER — ALBUMIN HUMAN 25 % IV SOLN
25.0000 g | Freq: Once | INTRAVENOUS | Status: AC
  Administered 2023-09-22: 25 g via INTRAVENOUS

## 2023-09-22 MED ORDER — ACETAMINOPHEN 650 MG RE SUPP: 650.0000 mg | Freq: Four times a day (QID) | RECTAL | Status: DC | PRN

## 2023-09-22 MED ORDER — PENTAFLUOROPROP-TETRAFLUOROETH EX AERO: 1.0000 | INHALATION_SPRAY | CUTANEOUS | Status: DC | PRN

## 2023-09-22 MED ORDER — ONDANSETRON HCL 4 MG PO TABS: 4.0000 mg | ORAL_TABLET | Freq: Four times a day (QID) | ORAL | Status: DC | PRN

## 2023-09-22 MED ORDER — FLUTICASONE FUROATE-VILANTEROL 200-25 MCG/ACT IN AEPB
1.0000 | INHALATION_SPRAY | Freq: Every day | RESPIRATORY_TRACT | Status: DC
  Administered 2023-09-24 – 2023-09-27 (×4): 1 via RESPIRATORY_TRACT
  Filled 2023-09-22: qty 28

## 2023-09-22 MED ORDER — ALBUMIN HUMAN 25 % IV SOLN
25.0000 g | Freq: Once | INTRAVENOUS | Status: AC
Start: 2023-09-22 — End: 2023-09-22
  Administered 2023-09-22: 25 g via INTRAVENOUS
  Filled 2023-09-22: qty 100

## 2023-09-22 MED ORDER — LEVOTHYROXINE SODIUM 75 MCG PO TABS
175.0000 ug | ORAL_TABLET | Freq: Every day | ORAL | Status: DC
  Administered 2023-09-23 – 2023-09-27 (×5): 175 ug via ORAL
  Filled 2023-09-22 (×5): qty 1

## 2023-09-22 MED ORDER — LIDOCAINE HCL (PF) 1 % IJ SOLN
5.0000 mL | INTRAMUSCULAR | Status: DC | PRN
Start: 2023-09-22 — End: 2023-09-25

## 2023-09-22 MED ORDER — ACETAMINOPHEN 325 MG PO TABS
650.0000 mg | ORAL_TABLET | Freq: Four times a day (QID) | ORAL | Status: DC | PRN
  Administered 2023-09-25: 650 mg via ORAL
  Filled 2023-09-22: qty 2

## 2023-09-22 MED ORDER — ALLOPURINOL 100 MG PO TABS: 100.0000 mg | ORAL_TABLET | Freq: Every day | ORAL | Status: DC

## 2023-09-22 MED ORDER — ATORVASTATIN CALCIUM 10 MG PO TABS: 20.0000 mg | ORAL_TABLET | Freq: Every day | ORAL | Status: DC

## 2023-09-22 MED ORDER — LIDOCAINE-PRILOCAINE 2.5-2.5 % EX CREA
1.0000 | TOPICAL_CREAM | CUTANEOUS | Status: DC | PRN
Start: 2023-09-22 — End: 2023-09-25

## 2023-09-22 MED ORDER — HEPARIN SODIUM (PORCINE) 1000 UNIT/ML DIALYSIS
2000.0000 [IU] | INTRAMUSCULAR | Status: DC | PRN
Start: 2023-09-22 — End: 2023-09-25
  Administered 2023-09-24: 2000 [IU] via INTRAVENOUS_CENTRAL
  Filled 2023-09-22: qty 2

## 2023-09-22 MED ORDER — HEPARIN SODIUM (PORCINE) 1000 UNIT/ML DIALYSIS: 1000.0000 [IU] | INTRAMUSCULAR | Status: DC | PRN

## 2023-09-22 MED ORDER — SEVELAMER CARBONATE 800 MG PO TABS: 1600.0000 mg | ORAL_TABLET | Freq: Three times a day (TID) | ORAL | Status: DC

## 2023-09-22 MED ORDER — COLCHICINE 0.6 MG PO TABS
0.6000 mg | ORAL_TABLET | Freq: Every day | ORAL | Status: DC
  Administered 2023-09-23: 0.6 mg via ORAL
  Filled 2023-09-22: qty 1

## 2023-09-22 MED ORDER — ONDANSETRON HCL 4 MG/2ML IJ SOLN: 4.0000 mg | Freq: Four times a day (QID) | INTRAMUSCULAR | Status: DC | PRN

## 2023-09-22 MED ORDER — ALTEPLASE 2 MG IJ SOLR
2.0000 mg | Freq: Once | INTRAMUSCULAR | Status: DC | PRN
Start: 2023-09-22 — End: 2023-09-25

## 2023-09-22 MED ORDER — CHLORHEXIDINE GLUCONATE CLOTH 2 % EX PADS: 6.0000 | MEDICATED_PAD | Freq: Every day | CUTANEOUS | Status: DC

## 2023-09-22 MED ORDER — MIDODRINE HCL 5 MG PO TABS
15.0000 mg | ORAL_TABLET | Freq: Three times a day (TID) | ORAL | Status: DC
  Administered 2023-09-22 – 2023-09-25 (×7): 15 mg via ORAL
  Filled 2023-09-22 (×12): qty 3

## 2023-09-22 NOTE — H&P (Signed)
History and Physical    CONSTANCIA KARCHER YNW:[id number] DOB: 1951-07-16 DOA: 09/22/2023  PCP: Vladimir Crofts, FNP   Chief Complaint: sob  HPI: [name] [name] is a 72 y.o. female with medical history significant of ESRD on hemodialysis, obesity, COPD, type 1 diabetes, hypertension, hypothyroidism, Hyperlipidemia who presented to the emergency department due to shortness of breath.  Patient recently hospitalized on 12/6.  At that time she presented with hypoxia and respiratory distress and was placed on BiPAP.  She was found to be volume overloaded.  Nephrology was consulted and she was dialyzed.  Cardiology was consulted and she was found to have worsening pulmonary hypertension and newly reduced EF.  Goal-directed medical therapy was limited due to her kidney dysfunction and blood pressure.  She was eventually discharged outpatient follow-up.  She states that her symptoms recurred she developed worsening respiratory distress at home.  She is typically on 3 L of oxygen and was not wearing it at time of EMS arrival.  She was brought to the ER where she was found to be afebrile hemodynamically stable.  Labs were obtained which showed sodium 137, creatinine 4.6, WBC 8.8, hemoglobin 9.3, troponin 102, BNP 2200, VBG showed pH 7.09.  Chest x-ray showed volume overload.  Patient was started on BiPAP and admitted for further workup.   Review of Systems: Review of Systems  Constitutional:  Negative for chills and fever.  Respiratory:  Positive for cough and shortness of breath.   All other systems reviewed and are negative.    As per HPI otherwise 10 point review of systems negative.   Allergies  Allergen Reactions   Ace Inhibitors Anaphylaxis and Swelling    Angioedema with lisinopril   Hydralazine Other (See Comments)    Patient reported dizziness, headache, visual changes   Nebivolol Swelling    Chest pain (reaction to Bystolic)   Morphine And Codeine Itching   Rosuvastatin Other (See  Comments)    myalgias    Past Medical History:  Diagnosis Date   Anemia    Arthritis    Asthma    Chronic diastolic (congestive) heart failure (HCC)    Chronic kidney disease    Dialysis T-TH-Sat   Chronic pain syndrome    Class 2 obesity due to excess calories with body mass index (BMI) of 36.0 to 36.9 in adult    COPD (chronic obstructive pulmonary disease) (HCC)    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   Full dentures    GERD (gastroesophageal reflux disease)    Gout    History of hiatal hernia    History of transfusion    Hx MRSA infection    abscess left groin   Hyperlipidemia    Hypertension    Hypothyroid    Intractable nausea and vomiting 02/15/2021   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HCC)    Peripheral vascular disease (HCC)    Sleep apnea    UNABLE TO TOLERATE C PAP   Stress incontinence    TIA (transient ischemic attack)     X2 NO RESIDUAL PROBLEMS   Wears glasses     Past Surgical History:  Procedure Laterality Date   ABDOMINAL HYSTERECTOMY     AV FISTULA PLACEMENT Left 12/01/2021   Procedure: INSERTION OF LEFT ARM ARTERIOVENOUS (AV) GORE-TEX GRAFT;  Surgeon: Nada Libman, MD;  Location: [facility] OR;  Service: Vascular;  Laterality: Left;   BACK SURGERY  2020   BASCILIC VEIN TRANSPOSITION Left 04/05/2020  Procedure: BASILIC VEIN TRANSPOSITION FIRST STAGE;  Surgeon: Chuck Hint, MD;  Location: [facility] OR;  Service: Vascular;  Laterality: Left;   BIOPSY  08/28/2023   Procedure: BIOPSY;  Surgeon: Kerin Salen, MD;  Location: [facility] ENDOSCOPY;  Service: Gastroenterology;;   BOTOX INJECTION  08/28/2023   Procedure: BOTOX INJECTION;  Surgeon: Kerin Salen, MD;  Location: [facility] ENDOSCOPY;  Service: Gastroenterology;;   BRONCHIAL BIOPSY  01/23/2022   Procedure: BRONCHIAL BIOPSIES;  Surgeon: Josephine Igo, DO;  Location: [facility] ENDOSCOPY;  Service: Pulmonary;;   BRONCHIAL BIOPSY  08/07/2022   Procedure: BRONCHIAL BIOPSIES;  Surgeon: Josephine Igo, DO;  Location: [facility] ENDOSCOPY;  Service: Pulmonary;;   BRONCHIAL BRUSHINGS  08/07/2022   Procedure: BRONCHIAL BRUSHINGS;  Surgeon: Josephine Igo, DO;  Location: [facility] ENDOSCOPY;  Service: Pulmonary;;   BRONCHIAL NEEDLE ASPIRATION BIOPSY  01/23/2022   Procedure: BRONCHIAL NEEDLE ASPIRATION BIOPSIES;  Surgeon: Josephine Igo, DO;  Location: [facility] ENDOSCOPY;  Service: Pulmonary;;   BRONCHIAL NEEDLE ASPIRATION BIOPSY  08/07/2022   Procedure: BRONCHIAL NEEDLE ASPIRATION BIOPSIES;  Surgeon: Josephine Igo, DO;  Location: [facility] ENDOSCOPY;  Service: Pulmonary;;   COLON SURGERY  1995   DUE TO POLYP   DILATION AND CURETTAGE OF UTERUS     ENDOBRONCHIAL ULTRASOUND  01/23/2022   Procedure: ENDOBRONCHIAL ULTRASOUND;  Surgeon: Josephine Igo, DO;  Location: [facility] ENDOSCOPY;  Service: Pulmonary;;   ENTEROSCOPY N/A 07/10/2021   Procedure: ENTEROSCOPY;  Surgeon: Kerin Salen, MD;  Location: [facility] ENDOSCOPY;  Service: Gastroenterology;  Laterality: N/A;   ESOPHAGEAL DILATION  08/28/2023   Procedure: ESOPHAGEAL BALLOON  DILATION;  Surgeon: Kerin Salen, MD;  Location: [facility] ENDOSCOPY;  Service: Gastroenterology;;   ESOPHAGOGASTRODUODENOSCOPY N/A 01/25/2021   Procedure: ESOPHAGOGASTRODUODENOSCOPY (EGD);  Surgeon: Vida Rigger, MD;  Location: [facility] ENDOSCOPY;  Service: Endoscopy;  Laterality: N/A;   ESOPHAGOGASTRODUODENOSCOPY (EGD) WITH PROPOFOL N/A 08/28/2023   Procedure: ESOPHAGOGASTRODUODENOSCOPY (EGD) WITH PROPOFOL;  Surgeon: Kerin Salen, MD;  Location: [facility] ENDOSCOPY;  Service: Gastroenterology;  Laterality: N/A;   FINE NEEDLE ASPIRATION  01/23/2022   Procedure: FINE NEEDLE ASPIRATION (FNA) LINEAR;  Surgeon: Josephine Igo, DO;  Location: [facility] ENDOSCOPY;  Service: Pulmonary;;   FOREARM FRACTURE SURGERY     Left arm   GIVENS CAPSULE STUDY N/A 07/07/2021   Procedure: GIVENS CAPSULE STUDY;  Surgeon: Vida Rigger, MD;  Location: [facility] ENDOSCOPY;  Service: Endoscopy;  Laterality: N/A;   HERNIA REPAIR     w/ mesh   HOT  HEMOSTASIS N/A 07/10/2021   Procedure: HOT HEMOSTASIS (ARGON PLASMA COAGULATION/BICAP);  Surgeon: Kerin Salen, MD;  Location: [facility] ENDOSCOPY;  Service: Gastroenterology;  Laterality: N/A;   INCISION AND DRAINAGE ABSCESS Left 02/04/2013   Procedure: INCISION AND DRAINAGE LEFT BUTTOCK ABSCESS; INCISION AND DRAINAGE LEFT BREAST ABSCESS;  Surgeon: Shelly Rubenstein, MD;  Location: [facility] OR;  Service: General;  Laterality: Left;   INCISION AND DRAINAGE ABSCESS N/A 02/12/2013   Procedure: INCISION AND DEBRIDEMENT BUTTOCK WOUND ;  Surgeon: Wilmon Arms. Corliss Skains, MD;  Location: [facility] OR;  Service: General;  Laterality: N/A;   INCISION AND DRAINAGE ABSCESS N/A 02/14/2013   Procedure: INCISION AND DRAINAGE/DRESSING CHANGE;  Surgeon: Shelly Rubenstein, MD;  Location: [facility] OR;  Service: General;  Laterality: N/A;   INCISION AND DRAINAGE ABSCESS N/A 03/21/2015   Procedure: INCISION AND DRAINAGE PUBIC ABSCESS;  Surgeon: Glenna Fellows, MD;  Location: [facility] ORS;  Service: General;  Laterality: N/A;   INCISION AND DRAINAGE PERIRECTAL ABSCESS Left 02/10/2013   Procedure: IRRIGATION AND DEBRIDEMENT OF  BUTTOCK/PERINEAL ABSCESS;  Surgeon: Wilmon Arms. Corliss Skains, MD;  Location: [facility] OR;  Service: General;  Laterality: Left;   INCISION AND DRAINAGE PERIRECTAL ABSCESS N/A 02/16/2013   Procedure: IRRIGATION AND DEBRIDEMENT PERINEAL ABSCESS;  Surgeon: Liz Malady, MD;  Location: [facility] OR;  Service: General;  Laterality: N/A;   IR FLUORO GUIDE CV LINE RIGHT  11/30/2021   IR FLUORO GUIDE CV LINE RIGHT  08/22/2023   IR REMOVAL TUN CV CATH W/O FL  08/29/2023   IR US GUIDE VASC ACCESS RIGHT  11/30/2021   IR US GUIDE VASC ACCESS RIGHT  08/22/2023   IRRIGATION AND DEBRIDEMENT ABSCESS N/A 02/06/2013   Procedure: IRRIGATION AND DEBRIDEMENT BUTTOCK ABSCESS AND DRESSING CHANGE;  Surgeon: Shelly Rubenstein, MD;  Location: [facility] OR;  Service: General;  Laterality: N/A;   IRRIGATION AND DEBRIDEMENT ABSCESS Left 02/08/2013   Procedure: IRRIGATION AND DEBRIDEMENT  ABSCESS/DRESSING CHANGE;  Surgeon: Cherylynn Ridges, MD;  Location: [facility] OR;  Service: General;  Laterality: Left;   JOINT REPLACEMENT  2010 / 2012    LAPAROSCOPIC CHOLECYSTECTOMY     LEFT HEART CATH AND CORONARY ANGIOGRAPHY N/A 07/05/2023   Procedure: LEFT HEART CATH AND CORONARY ANGIOGRAPHY;  Surgeon: Lennette Bihari, MD;  Location: [facility] INVASIVE CV LAB;  Service: Cardiovascular;  Laterality: N/A;   LEFT HEART CATHETERIZATION WITH CORONARY ANGIOGRAM N/A 07/02/2014   Procedure: LEFT HEART CATHETERIZATION WITH CORONARY ANGIOGRAM;  Surgeon: Runell Gess, MD;  Location: [facility] CATH LAB;  Service: Cardiovascular;  Laterality: N/A;   LOWER EXTREMITY ANGIOGRAM Right 04/20/2016   Procedure: Lower Extremity Angiogram;  Surgeon: Sherren Kerns, MD;  Location: [facility] INVASIVE CV LAB;  Service: Cardiovascular;  Laterality: Right;   MULTIPLE TOOTH EXTRACTIONS     PERIPHERAL VASCULAR CATHETERIZATION N/A 04/20/2016   Procedure: Abdominal Aortogram;  Surgeon: Sherren Kerns, MD;  Location: [facility] INVASIVE CV LAB;  Service: Cardiovascular;  Laterality: N/A;   PERIPHERAL VASCULAR CATHETERIZATION Right 04/20/2016   Procedure: Peripheral Vascular Intervention;  Surgeon: Sherren Kerns, MD;  Location: [facility] INVASIVE CV LAB;  Service: Cardiovascular;  Laterality: Right;  popiteal   RIGHT HEART CATH N/A 10/27/2018   Procedure: RIGHT HEART CATH;  Surgeon: Laurey Morale, MD;  Location: [facility] INVASIVE CV LAB;  Service: Cardiovascular;  Laterality: N/A;   RIGHT HEART CATH N/A 03/20/2019   Procedure: RIGHT HEART CATH;  Surgeon: Laurey Morale, MD;  Location: [facility] INVASIVE CV LAB;  Service: Cardiovascular;  Laterality: N/A;   THYROIDECTOMY     TOTAL HIP REVISION Right 08/11/2014   Procedure: RIGHT ACETABULAR REVISION;  Surgeon: Loanne Drilling, MD;  Location: [facility] ORS;  Service: Orthopedics;  Laterality: Right;   VASCULAR SURGERY     VIDEO BRONCHOSCOPY WITH ENDOBRONCHIAL ULTRASOUND Bilateral 08/07/2022   Procedure: VIDEO BRONCHOSCOPY WITH  ENDOBRONCHIAL ULTRASOUND;  Surgeon: Josephine Igo, DO;  Location: [facility] ENDOSCOPY;  Service: Pulmonary;  Laterality: Bilateral;   VIDEO BRONCHOSCOPY WITH RADIAL ENDOBRONCHIAL ULTRASOUND  01/23/2022   Procedure: RADIAL ENDOBRONCHIAL ULTRASOUND;  Surgeon: Josephine Igo, DO;  Location: [facility] ENDOSCOPY;  Service: Pulmonary;;     reports that she quit smoking about 1 years ago. Her smoking use included cigarettes. She started smoking about 41 years ago. She has a 10 pack-year smoking history. She has been exposed to tobacco smoke. She has quit using smokeless tobacco. She reports current alcohol use of about 1.0 standard drink of alcohol per week. She reports that she does not use drugs.  Family History  Problem Relation Age of Onset  Diabetes Brother    Cardiomyopathy Mother    Heart disease Mother    Cancer Father     Prior to Admission medications   Medication Sig Start Date End Date Taking? Authorizing Provider  acetaminophen (TYLENOL) 500 MG tablet Take 1 tablet (500 mg total) by mouth See admin instructions. Take 1,000mg  (2 tablets) by mouth twice a day in addition to 1,000mg  (2 tablets) twice daily as needed for pain. Patient taking differently: Take 1,000 mg by mouth in the morning and at bedtime. 05/14/23   Ghimire, Werner Lean, MD  albuterol (PROVENTIL) (2.5 MG/3ML) 0.083% nebulizer solution Take 3 mLs (2.5 mg total) by nebulization every 6 (six) hours as needed for wheezing or shortness of breath. Patient taking differently: Take 2.5 mg by nebulization 3 (three) times daily. 05/14/23   Ghimire, Werner Lean, MD  albuterol (VENTOLIN HFA) 108 (90 Base) MCG/ACT inhaler Inhale 2 puffs into the lungs every 6 (six) hours as needed for shortness of breath (COPD). 05/14/23   Ghimire, Werner Lean, MD  allopurinol (ZYLOPRIM) 100 MG tablet Take 1 tablet (100 mg total) by mouth daily. 09/11/23   Leroy Sea, MD  apixaban (ELIQUIS) 5 MG TABS tablet Take 1 tablet (5 mg total) by mouth 2 (two) times daily.  07/20/23   Ghimire, Werner Lean, MD  atorvastatin (LIPITOR) 20 MG tablet Take 1 tablet (20 mg total) by mouth daily. 09/14/23   Zannie Cove, MD  budesonide-formoterol [facility]) 160-4.5 MCG/ACT inhaler Inhale 2 puffs into the lungs in the morning and at bedtime. 05/14/23   Ghimire, Werner Lean, MD  colchicine 0.6 MG tablet Take 0.5 tablets (0.3 mg total) by mouth daily. 08/29/23   Leroy Sea, MD  diclofenac Sodium (VOLTAREN) 1 % GEL Apply 2 g topically 4 (four) times daily. Patient taking differently: Apply 1 Application topically daily. 04/17/23   Joseph Art, DO  docusate sodium (COLACE) 100 MG capsule Take 1 capsule (100 mg total) by mouth daily. 05/14/23   Ghimire, Werner Lean, MD  fluticasone (FLONASE) 50 MCG/ACT nasal spray Place 1 spray into both nostrils daily. Patient taking differently: Place 2 sprays into both nostrils daily. 05/14/23   Ghimire, Werner Lean, MD  insulin lispro (HUMALOG KWIKPEN) 100 UNIT/ML KwikPen 0-15 Units, Subcutaneous, 3 times daily with meal CBG < 70: Implement Hypoglycemia meausers CBG 70 - 120: 0 units CBG 121 - 150: 2 units CBG 151 - 200: 3 units CBG 201 - 250: 5 units CBG 251 - 300: 8 units CBG 301 - 350: 11 units CBG 351 - 400: 15 units CBG > 400: call MD Patient taking differently: Inject 0-15 Units into the skin daily. 05/14/23   Ghimire, Werner Lean, MD  levothyroxine (SYNTHROID) 175 MCG tablet Take 1 tablet (175 mcg total) by mouth daily. 05/14/23   Ghimire, Werner Lean, MD  lidocaine-prilocaine (EMLA) cream Apply 1 Application topically See admin instructions. On dialysis days (Tues,Thurs,Sat) 02/13/22   [provider]  meclizine (ANTIVERT) 25 MG tablet Take 25 mg by mouth as needed for dizziness or nausea. 06/12/23   [provider]  metoprolol tartrate (LOPRESSOR) 25 MG tablet Take 1.5 tablets (37.5 mg total) by mouth 2 (two) times daily. Patient not taking: Reported on 09/10/2023 07/20/23   Maretta Bees, MD  Multiple Vitamins-Iron  (MULTIVITAMINS WITH IRON) TABS tablet Take 1 tablet by mouth in the morning. 05/14/23   Ghimire, Werner Lean, MD  nitroGLYCERIN (NITROSTAT) 0.4 MG SL tablet Place 1 tablet (0.4 mg total) under the tongue  every 5 (five) minutes as needed for chest pain. Patient not taking: Reported on 09/10/2023 07/06/23 07/05/24  Cyndi Bender, NP  OXYGEN Inhale 3 L into the lungs.    [provider]  rosuvastatin (CRESTOR) 20 MG tablet Take 1 tablet (20 mg total) by mouth daily. Patient not taking: Reported on 09/10/2023 07/06/23   Cyndi Bender, NP  sevelamer carbonate (RENVELA) 800 MG tablet Take 2 tablets (1,600 mg total) by mouth 3 (three) times daily with meals. 05/14/23   Ghimire, Werner Lean, MD  thiamine (VITAMIN B1) 100 MG tablet Take 1 tablet (100 mg total) by mouth in the morning. 05/14/23   Maretta Bees, MD    Physical Exam: Vitals:   09/22/23 1215 09/22/23 1230 09/22/23 1245 09/22/23 1300  BP: (!) 127/43 (!) 129/43 (!) 127/44 (!) 123/43  Pulse: 86 88 86 81  Resp: 11 11 13 16   Temp:      TempSrc:      SpO2: 99% 100% 100% 99%   Physical Exam Constitutional:      Appearance: She is obese.  HENT:     Head: Normocephalic.     Nose: Nose normal.     Mouth/Throat:     Mouth: Mucous membranes are moist.     Pharynx: Oropharynx is clear.  Eyes:     Conjunctiva/sclera: Conjunctivae normal.     Pupils: Pupils are equal, round, and reactive to light.  Cardiovascular:     Rate and Rhythm: Normal rate and regular rhythm.     Pulses: Normal pulses.     Heart sounds: Normal heart sounds.  Pulmonary:     Effort: Pulmonary effort is normal.     Breath sounds: Normal breath sounds.  Abdominal:     General: Abdomen is flat. Bowel sounds are normal.  Musculoskeletal:        General: Normal range of motion.     Cervical back: Normal range of motion.  Skin:    General: Skin is warm.     Capillary Refill: Capillary refill takes less than 2 seconds.  Neurological:     General: No focal deficit present.      Mental Status: She is alert. Mental status is at baseline.  Psychiatric:        Mood and Affect: Mood normal.        Labs on Admission: I have personally reviewed the patients's labs and imaging studies.  Assessment/Plan Principal Problem:   Volume overload   # Acute hypoxic hypercarbic respiratory failure secondary to acute on chronic heart failure with reduced ejection fraction exacerbation, POA, active # ESRD on hemodialysis # Severe pulmonary hypertension - Patient found to have hypercarbic respiratory failure -Chest x-ray with cardiomegaly - Volume overload on exam with crackles in the lungs - Recent echocardiogram shows EF 20 to 25% Plan: Placed on BiPAP Nephrology consulted for volume removal with dialysis Check labs in morning  # Anemia of chronic disease-trend hemoglobin, will need EPO with dialysis  # CAD-no evidence of ACS as elevated troponins likely due to volume overload  # Type 2 diabetes-placed on sliding scale  # Paroxysmal A-fib-continue anticoagulation with Eliquis and metoprolol  # History of small cell lung cancer-outpatient follow-up  # Class II obesity-encourage weight loss  #Hypothryroidism- continue synthroid   Admission status: Inpatient Progressive  Certification: The appropriate patient status for this patient is INPATIENT. Inpatient status is judged to be reasonable and necessary in order to provide the required intensity of service to ensure the patient's safety.  The patient's presenting symptoms, physical exam findings, and initial radiographic and laboratory data in the context of their chronic comorbidities is felt to place them at high risk for further clinical deterioration. Furthermore, it is not anticipated that the patient will be medically stable for discharge from the hospital within 2 midnights of admission.   * I certify that at the point of admission it is my clinical judgment that the patient will require inpatient  hospital care spanning beyond 2 midnights from the point of admission due to high intensity of service, high risk for further deterioration and high frequency of surveillance required.Alan Mulder MD [facility] If 7PM-7AM, please contact night-coverage [email or web address]  09/22/2023, 1:32 PM

## 2023-09-22 NOTE — Progress Notes (Signed)
RT assisted with transport of this pt from ED room 34 to ED room 47 to receive bedside dialysis. Pt tolerated well with SVS.

## 2023-09-22 NOTE — ED Notes (Signed)
Per Dr. Avie Arenas due to normalized ABG pt. To be taken off of Bipap. RT made aware.

## 2023-09-22 NOTE — ED Notes (Signed)
RT notified regarding ABG

## 2023-09-22 NOTE — Consult Note (Signed)
Renal Service Consult Note [facility]  [name] [name] 09/22/2023 [name], MD Requesting Physician: Dr. Theresia Lo  Reason for Consult: ESRD pt w/ resp distress on bipap HPI: The patient is a 72 y.o. year-old w/ PMH as below who presented to ED this am w/ SOB. Last admit 12/06 she had similar presentation. Cardiology consulted and found worsening pulm HTN and newly reduced LVEF. She was dc'd back to home O2 3L which is her baseline. Her SOB symptoms have recurred and she was brought to ED today. Pt afeb, Bp's 120/80. WBC 8, Hb 9.3, creat 4.6, K+ wnl. CXR showed early IS edema and possible bilat effusions. Pt is to be admitted. We are asked to see for dialysis.   Pt seen in ED room. Pt is quite somnolent, did not get history. Earlier the ED MD said she was answering simple questions.  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   Full dentures    GERD (gastroesophageal reflux disease)    Gout    History of hiatal hernia    History of transfusion    Hx MRSA infection    abscess left groin   Hyperlipidemia    Hypertension    Hypothyroid    Intractable nausea and vomiting 02/15/2021   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HCC)    Peripheral vascular disease (HCC)    Sleep apnea    UNABLE TO TOLERATE C PAP   Stress incontinence    TIA (transient ischemic attack)     X2 NO RESIDUAL PROBLEMS   Wears glasses    Past Surgical History  Past Surgical History:  Procedure Laterality Date   ABDOMINAL HYSTERECTOMY     AV FISTULA PLACEMENT Left 12/01/2021    Procedure: INSERTION OF LEFT ARM ARTERIOVENOUS (AV) GORE-TEX GRAFT;  Surgeon: Nada Libman, MD;  Location: [facility] OR;  Service: Vascular;  Laterality: Left;   BACK SURGERY  2020   BASCILIC VEIN TRANSPOSITION Left 04/05/2020   Procedure: BASILIC VEIN TRANSPOSITION FIRST STAGE;  Surgeon: Chuck Hint, MD;  Location: [facility] OR;  Service: Vascular;  Laterality: Left;   BIOPSY  08/28/2023   Procedure: BIOPSY;  Surgeon: Kerin Salen, MD;  Location: [facility] ENDOSCOPY;  Service: Gastroenterology;;   BOTOX INJECTION  08/28/2023   Procedure: BOTOX INJECTION;  Surgeon: Kerin Salen, MD;  Location: [facility] ENDOSCOPY;  Service: Gastroenterology;;   BRONCHIAL BIOPSY  01/23/2022   Procedure: BRONCHIAL BIOPSIES;  Surgeon: Josephine Igo, DO;  Location: [facility] ENDOSCOPY;  Service: Pulmonary;;   BRONCHIAL BIOPSY  08/07/2022   Procedure: BRONCHIAL BIOPSIES;  Surgeon: Josephine Igo, DO;  Location: [facility] ENDOSCOPY;  Service: Pulmonary;;   BRONCHIAL BRUSHINGS  08/07/2022   Procedure: BRONCHIAL BRUSHINGS;  Surgeon: Josephine Igo, DO;  Location: [facility] ENDOSCOPY;  Service: Pulmonary;;   BRONCHIAL NEEDLE ASPIRATION BIOPSY  01/23/2022   Procedure: BRONCHIAL NEEDLE ASPIRATION BIOPSIES;  Surgeon: Josephine Igo, DO;  Location: [facility] ENDOSCOPY;  Service: Pulmonary;;   BRONCHIAL NEEDLE ASPIRATION BIOPSY  08/07/2022   Procedure: BRONCHIAL NEEDLE ASPIRATION BIOPSIES;  Surgeon: Josephine Igo, DO;  Location: [facility] ENDOSCOPY;  Service: Pulmonary;;   COLON SURGERY  1995   DUE TO POLYP   DILATION AND CURETTAGE OF UTERUS     ENDOBRONCHIAL ULTRASOUND  01/23/2022   Procedure: ENDOBRONCHIAL ULTRASOUND;  Surgeon: Josephine Igo, DO;  Location: [facility] ENDOSCOPY;  Service: Pulmonary;;   ENTEROSCOPY N/A 07/10/2021   Procedure: ENTEROSCOPY;  Surgeon: Kerin Salen, MD;  Location: [facility] ENDOSCOPY;  Service: Gastroenterology;  Laterality: N/A;   ESOPHAGEAL DILATION  08/28/2023   Procedure: ESOPHAGEAL BALLOON  DILATION;  Surgeon: Kerin Salen, MD;  Location: [facility]  ENDOSCOPY;  Service: Gastroenterology;;   ESOPHAGOGASTRODUODENOSCOPY N/A 01/25/2021   Procedure: ESOPHAGOGASTRODUODENOSCOPY (EGD);  Surgeon: Vida Rigger, MD;  Location: [facility] ENDOSCOPY;  Service: Endoscopy;  Laterality: N/A;   ESOPHAGOGASTRODUODENOSCOPY (EGD) WITH PROPOFOL N/A 08/28/2023   Procedure: ESOPHAGOGASTRODUODENOSCOPY (EGD) WITH PROPOFOL;  Surgeon: Kerin Salen, MD;  Location: [facility] ENDOSCOPY;  Service: Gastroenterology;  Laterality: N/A;   FINE NEEDLE ASPIRATION  01/23/2022   Procedure: FINE NEEDLE ASPIRATION (FNA) LINEAR;  Surgeon: Josephine Igo, DO;  Location: [facility] ENDOS[facility];  Service: Pulmonary;;   FOREARM FRACTURE SURGERY     Left arm   GIVENS CAPSULE STUDY N/A 07/07/2021   Procedure: GIVENS CAPSULE STUDY;  Surgeon: Vida Rigger, MD;  Location: [facility] ENDOSCOPY;  Service: Endoscopy;  Laterality: N/A;   HERNIA REPAIR     w/ mesh   HOT HEMOSTASIS N/A 07/10/2021   Procedure: HOT HEMOSTASIS (ARGON PLASMA COAGULATION/BICAP);  Surgeon: Kerin Salen, MD;  Location: [facility] ENDOSCOPY;  Service: Gastroenterology;  Laterality: N/A;   INCISION AND DRAINAGE ABSCESS Left 02/04/2013   Procedure: INCISION AND DRAINAGE LEFT BUTTOCK ABSCESS; INCISION AND DRAINAGE LEFT BREAST ABSCESS;  Surgeon: Shelly Rubenstein, MD;  Location: [facility] OR;  Service: General;  Laterality: Left;   INCISION AND DRAINAGE ABSCESS N/A 02/12/2013   Procedure: INCISION AND DEBRIDEMENT BUTTOCK WOUND ;  Surgeon: Wilmon Arms. Corliss Skains, MD;  Location: [facility] OR;  Service: General;  Laterality: N/A;   INCISION AND DRAINAGE ABSCESS N/A 02/14/2013   Procedure: INCISION AND DRAINAGE/DRESSING CHANGE;  Surgeon: Shelly Rubenstein, MD;  Location: [facility] OR;  Service: General;  Laterality: N/A;   INCISION AND DRAINAGE ABSCESS N/A 03/21/2015   Procedure: INCISION AND DRAINAGE PUBIC ABSCESS;  Surgeon: Glenna Fellows, MD;  Location: [facility] ORS;  Service: General;  Laterality: N/A;   INCISION AND DRAINAGE PERIRECTAL ABSCESS Left 02/10/2013   Procedure: IRRIGATION AND DEBRIDEMENT OF  BUTTOCK/PERINEAL ABSCESS;  Surgeon: Wilmon Arms. Corliss Skains, MD;  Location: [facility] OR;  Service: General;  Laterality: Left;   INCISION AND DRAINAGE PERIRECTAL ABSCESS N/A 02/16/2013   Procedure: IRRIGATION AND DEBRIDEMENT PERINEAL ABSCESS;  Surgeon: Liz Malady, MD;  Location: [facility] OR;  Service: General;  Laterality: N/A;   IR FLUORO GUIDE CV LINE RIGHT  11/30/2021   IR FLUORO GUIDE CV LINE RIGHT  08/22/2023   IR REMOVAL TUN CV CATH W/O FL  08/29/2023   IR US GUIDE VASC ACCESS RIGHT  11/30/2021   IR US GUIDE VASC ACCESS RIGHT  08/22/2023   IRRIGATION AND DEBRIDEMENT ABSCESS N/A 02/06/2013   Procedure: IRRIGATION AND DEBRIDEMENT BUTTOCK ABSCESS AND DRESSING CHANGE;  Surgeon: Shelly Rubenstein, MD;  [facility];  Service: General;  Laterality: N/A;   IRRIGATION AND DEBRIDEMENT ABSCESS Left 02/08/2013   Procedure: IRRIGATION AND DEBRIDEMENT ABSCESS/DRESSING CHANGE;  Surgeon: Cherylynn Ridges, MD;  Location: [facility] OR;  Service: General;  Laterality: Left;   JOINT REPLACEMENT  2010 / 2012    LAPAROSCOPIC CHOLECYSTECTOMY     LEFT HEART CATH AND CORONARY ANGIOGRAPHY N/A 07/05/2023   Procedure: LEFT HEART CATH AND CORONARY ANGIOGRAPHY;  Surgeon: Lennette Bihari, MD;  [facility]: [facility] INVASIVE CV LAB;  Service: Cardiovascular;  Laterality: N/A;   LEFT HEART CATHETERIZATION WITH CORONARY ANGIOGRAM N/A 07/02/2014   Procedure: LEFT HEART CATHETERIZATION WITH CORONARY ANGIOGRAM;  Surgeon: Runell Gess, MD;  Location: [facility] CATH LAB;  Service: Cardiovascular;  Laterality: N/A;   LOWER EXTREMITY ANGIOGRAM Right 04/20/2016   Procedure: Lower Extremity Angiogram;  Surgeon: Sherren Kerns, MD;  Location: [facility] INVASIVE CV LAB;  Service: Cardiovascular;  Laterality: Right;   MULTIPLE TOOTH EXTRACTIONS     PERIPHERAL VASCULAR CATHETERIZATION N/A 04/20/2016   Procedure: Abdominal Aortogram;  Surgeon: Sherren Kerns, MD;  Location: [facility] INVASIVE CV LAB;  Service: Cardiovascular;  Laterality: N/A;   PERIPHERAL VASCULAR CATHETERIZATION  Right 04/20/2016   Procedure: Peripheral Vascular Intervention;  Surgeon: Sherren Kerns, MD;  Location: [facility] INVASIVE CV LAB;  Service: Cardiovascular;  Laterality: Right;  popiteal   RIGHT HEART CATH N/A 10/27/2018   Procedure: RIGHT HEART CATH;  Surgeon: Laurey Morale, MD;  Location: [facility] INVASIVE CV LAB;  Service: Cardiovascular;  Laterality: N/A;   RIGHT HEART CATH N/A 03/20/2019   Procedure: RIGHT HEART CATH;  Surgeon: Laurey Morale, MD;  Location: [facility] INVASIVE CV LAB;  Service: Cardiovascular;  Laterality: N/A;   THYROIDECTOMY     TOTAL HIP REVISION Right 08/11/2014   Procedure: RIGHT ACETABULAR REVISION;  Surgeon: Loanne Drilling, MD;  Location: [facility] ORS;  Service: Orthopedics;  Laterality: Right;   VASCULAR SURGERY     VIDEO BRONCHOSCOPY WITH ENDOBRONCHIAL ULTRASOUND Bilateral 08/07/2022   Procedure: VIDEO BRONCHOSCOPY WITH ENDOBRONCHIAL ULTRASOUND;  Surgeon: Josephine Igo, DO;  Location: [facility] ENDOSCOPY;  Service: Pulmonary;  Laterality: Bilateral;   VIDEO BRONCHOSCOPY WITH RADIAL ENDOBRONCHIAL ULTRASOUND  01/23/2022   Procedure: RADIAL ENDOBRONCHIAL ULTRASOUND;  Surgeon: Josephine Igo, DO;  Location: [facility] ENDOSCOPY;  Service: Pulmonary;;   Family History  Family History  Problem Relation Age of Onset   Diabetes Brother    Cardiomyopathy Mother    Heart disease Mother    Cancer Father    Social History  reports that she quit smoking about 1 years ago. Her smoking use included cigarettes. She started smoking about 41 years ago. She has a 10 pack-year smoking history. She has been exposed to tobacco smoke. She has quit using smokeless tobacco. She reports current alcohol use of about 1.0 standard drink of alcohol per week. She reports that she does not use drugs. Allergies  Allergies  Allergen Reactions   Ace Inhibitors Anaphylaxis and Swelling    Angioedema with lisinopril   Hydralazine Other (See Comments)    Patient reported dizziness, headache, visual changes   Nebivolol  Swelling    Chest pain (reaction to Bystolic)   Morphine And Codeine Itching   Rosuvastatin Other (See Comments)    myalgias   Home medications Prior to Admission medications   Medication Sig Start Date End Date Taking? Authorizing Provider  acetaminophen (TYLENOL) 500 MG tablet Take 1 tablet (500 mg total) by mouth See admin instructions. Take 1,000mg  (2 tablets) by mouth twice a day in addition to 1,000mg  (2 tablets) twice daily as needed for pain. Patient taking differently: Take 1,000 mg by mouth in the morning and at bedtime. 05/14/23   Ghimire, Werner Lean, MD  albuterol (PROVENTIL) (2.5 MG/3ML) 0.083% nebulizer solution  Take 3 mLs (2.5 mg total) by nebulization every 6 (six) hours as needed for wheezing or shortness of breath. Patient taking differently: Take 2.5 mg by nebulization 3 (three) times daily. 05/14/23   Ghimire, Werner Lean, MD  albuterol (VENTOLIN HFA) 108 (90 Base) MCG/ACT inhaler Inhale 2 puffs into the lungs every 6 (six) hours as needed for shortness of breath (COPD). 05/14/23   Ghimire, Werner Lean, MD  allopurinol (ZYLOPRIM) 100 MG tablet Take 1 tablet (100 mg total) by mouth daily. 09/11/23   Leroy Sea, MD  apixaban (ELIQUIS) 5 MG TABS tablet Take 1 tablet (5 mg total) by mouth 2 (two) times daily. 07/20/23   Ghimire, Werner Lean, MD  atorvastatin (LIPITOR) 20 MG tablet Take 1 tablet (20 mg total) by mouth daily. 09/14/23   Zannie Cove, MD  budesonide-formoterol [facility]) 160-4.5 MCG/ACT inhaler Inhale 2 puffs into the lungs in the morning and at bedtime. 05/14/23   Ghimire, Werner Lean, MD  colchicine 0.6 MG tablet Take 0.5 tablets (0.3 mg total) by mouth daily. 08/29/23   Leroy Sea, MD  diclofenac Sodium (VOLTAREN) 1 % GEL Apply 2 g topically 4 (four) times daily. Patient taking differently: Apply 1 Application topically daily. 04/17/23   Joseph Art, DO  docusate sodium (COLACE) 100 MG capsule Take 1 capsule (100 mg total) by mouth daily. 05/14/23   Ghimire,  Werner Lean, MD  fluticasone (FLONASE) 50 MCG/ACT nasal spray Place 1 spray into both nostrils daily. Patient taking differently: Place 2 sprays into both nostrils daily. 05/14/23   Ghimire, Werner Lean, MD  insulin lispro (HUMALOG KWIKPEN) 100 UNIT/ML KwikPen 0-15 Units, Subcutaneous, 3 times daily with meal CBG < 70: Implement Hypoglycemia meausers CBG 70 - 120: 0 units CBG 121 - 150: 2 units CBG 151 - 200: 3 units CBG 201 - 250: 5 units CBG 251 - 300: 8 units CBG 301 - 350: 11 units CBG 351 - 400: 15 units CBG > 400: call MD Patient taking differently: Inject 0-15 Units into the skin daily. 05/14/23   Ghimire, Werner Lean, MD  levothyroxine (SYNTHROID) 175 MCG tablet Take 1 tablet (175 mcg total) by mouth daily. 05/14/23   Ghimire, Werner Lean, MD  lidocaine-prilocaine (EMLA) cream Apply 1 Application topically See admin instructions. On dialysis days (Tues,Thurs,Sat) 02/13/22   [provider]  meclizine (ANTIVERT) 25 MG tablet Take 25 mg by mouth as needed for dizziness or nausea. 06/12/23   [provider]  metoprolol tartrate (LOPRESSOR) 25 MG tablet Take 1.5 tablets (37.5 mg total) by mouth 2 (two) times daily. Patient not taking: Reported on 09/10/2023 07/20/23   Maretta Bees, MD  Multiple Vitamins-Iron (MULTIVITAMINS WITH IRON) TABS tablet Take 1 tablet by mouth in the morning. 05/14/23   Ghimire, Werner Lean, MD  nitroGLYCERIN (NITROSTAT) 0.4 MG SL tablet Place 1 tablet (0.4 mg total) under the tongue every 5 (five) minutes as needed for chest pain. Patient not taking: Reported on 09/10/2023 07/06/23 07/05/24  Cyndi Bender, NP  OXYGEN Inhale 3 L into the lungs.    [provider]  rosuvastatin (CRESTOR) 20 MG tablet Take 1 tablet (20 mg total) by mouth daily. Patient not taking: Reported on 09/10/2023 07/06/23   Cyndi Bender, NP  sevelamer carbonate (RENVELA) 800 MG tablet Take 2 tablets (1,600 mg total) by mouth 3 (three) times daily with meals. 05/14/23   Ghimire, Werner Lean, MD   thiamine (VITAMIN B1) 100 MG tablet Take 1 tablet (100 mg total) by  mouth in the morning. 05/14/23   Maretta Bees, MD     Vitals:   09/22/23 1419 09/22/23 1420 09/22/23 1421 09/22/23 1430  BP:  (!) 116/44  (!) 112/52  Pulse: 80 83 80 85  Resp: 12 13 14 13   Temp:      TempSrc:      SpO2: 100% 100% 100% 100%   Exam Gen somnolent on bipap, resp effort okay, not great No rash, cyanosis or gangrene Sclera anicteric, throat clear  No jvd or bruits Chest clear bilat to bases, no rales/ wheezing RRR no RG Abd soft ntnd no mass or ascites +bs GU defer MS no joint effusions or deformity Ext no sig LE or UE edema, no wounds or ulcers Neuro is alert, Ox 3 , nf    LUA AVG+bruit       Renal-related home meds: - eliquis 5 bid - metoprolol 37.5mg  bid - renvela 2 ac tid - O2 3L [place]    OP HD: [facility]  4h  400/600   2/2 bath  LUA AVG  Heparin 2000+ 2000 midrun    Assessment/ Plan: 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  Also happened in October, same problem, and was here for 9 days. Also happened in July when she was here for 9 days. Pt is on bipap in ED. She is DNR/ DNI. Plan is HD as tolerated in ED asap.  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 Plan is for HD as above.  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  MBD ckd - CCa  in range, add on phos.  pHTN - worsening per recent cardiology assessment Chronic syst CHF - worsening pre recent cardiology assessment      [name]  MD [facility] 09/22/2023, 2:42 PM  Recent Labs  Lab 09/22/23 1056 09/22/23 1128 09/22/23 1313  HGB 9.3* 11.2* 7.1*  ALBUMIN 3.4*  --   --   CALCIUM 9.7  --   --   CREATININE 4.63*  --   --   K 4.0 4.0 2.5*   Inpatient medications:  allopurinol  100 mg Oral Daily   apixaban  5 mg Oral BID    atorvastatin  20 mg Oral Daily   colchicine  0.6 mg Oral Daily   insulin aspart  0-24 Units Subcutaneous TID WC   levothyroxine  175 mcg Oral Daily   sevelamer carbonate  1,600 mg Oral TID WC   [START ON 09/23/2023] thiamine  100 mg Oral q AM    acetaminophen **OR** acetaminophen, ondansetron **OR** ondansetron (ZOFRAN) IV

## 2023-09-22 NOTE — ED Notes (Signed)
ED TO INPATIENT HANDOFF REPORT  ED Nurse Name and Phone #: Beatris Ship RN [phone]  S Name/Age/Gender [name] [name] 72 y.o. female Room/Bed: 034C/034C  Code Status   Code Status: Limited: Do not attempt resuscitation (DNR) -DNR-LIMITED -Do Not Intubate/DNI   Home/SNF/Other Home Patient oriented to: self, place, time, and situation Is this baseline? Yes   Triage Complete: Triage complete  Chief Complaint Volume overload [E87.70]  Triage Note Pt BIB [facility] from home d/t resp distress. Pt reports not using her BIPAP at home as she should have been but wearing 5L O2 via n/c & was 80% spO2 when EMS arrived. EMS reports 40 resp/min, wheezing in her "uppers" & diminished in the "lowers." Pt denies any ned Tx working at home, EMS did give 5mg  Duo-neb & 0.5mg  Atrovent while en route to ED, after nebs she was then having rales, she was placed on CPAP & her spO2 was 100 with that. Se was also given 0.4 nitroglycerin & reports she had been having some hallucinations in the past few days as well. Is a dialysis pt (schedule: Tu/Th/Sat) with Lt arm restrict, EMS was not able to get any access. A/Ox4 upon arrival to ED, 154/89, CBG 282.     Allergies Allergies  Allergen Reactions   Ace Inhibitors Anaphylaxis and Swelling    Angioedema with lisinopril   Hydralazine Other (See Comments)    Patient reported dizziness, headache, visual changes   Nebivolol Swelling    Chest pain (reaction to Bystolic)   Morphine And Codeine Itching   Rosuvastatin Other (See Comments)    myalgias    Level of Care/Admitting Diagnosis ED Disposition     ED Disposition  Admit   Condition  --   Comment  Hospital Area: [facility] [[id number]]  Level of Care: Progressive [102]  Admit to Progressive based on following criteria: RESPIRATORY PROBLEMS hypoxemic/hypercapnic respiratory failure that is responsive to NIPPV (BiPAP) or High Flow Nasal Cannula (6-80 lpm). Frequent assessment/intervention, no >  Q2 hrs < Q4 hrs, to maintain oxygenation and pulmonary hygiene.  May admit patient to [facility] or [facility] if equivalent level of care is available:: No  Covid Evaluation: Asymptomatic - no recent exposure (last 10 days) testing not required  Diagnosis: Volume overload [914782]  Admitting Physician: Alan Mulder [[id number]]  Attending Physician: Alan Mulder [phone]  Certification:: I certify this patient will need inpatient services for at least 2 midnights          B Medical/Surgery History Past Medical History:  Diagnosis Date   Anemia    Arthritis    Asthma    Chronic diastolic (congestive) heart failure (HCC)    Chronic kidney disease    Dialysis T-TH-Sat   Chronic pain syndrome    Class 2 obesity due to excess calories with body mass index (BMI) of 36.0 to 36.9 in adult    COPD (chronic obstructive pulmonary disease) (HCC)    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   Full dentures    GERD (gastroesophageal reflux disease)    Gout    History of hiatal hernia    History of transfusion    Hx MRSA infection    abscess left groin   Hyperlipidemia    Hypertension    Hypothyroid    Intractable nausea and vomiting 02/15/2021   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HCC)    Peripheral vascular disease (HCC)    Sleep apnea    UNABLE  TO TOLERATE C PAP   Stress incontinence    TIA (transient ischemic attack)     X2 NO RESIDUAL PROBLEMS   Wears glasses    Past Surgical History:  Procedure Laterality Date   ABDOMINAL HYSTERECTOMY     AV FISTULA PLACEMENT Left 12/01/2021   Procedure: INSERTION OF LEFT ARM ARTERIOVENOUS (AV) GORE-TEX GRAFT;  Surgeon: Nada Libman, MD;  Location: [facility] OR;  Service: Vascular;  Laterality: Left;   BACK SURGERY  2020   BASCILIC VEIN TRANSPOSITION Left 04/05/2020   Procedure: BASILIC VEIN TRANSPOSITION FIRST STAGE;  Surgeon: Chuck Hint, MD;  Location: [facility] OR;  Service: Vascular;  Laterality: Left;    BIOPSY  08/28/2023   Procedure: BIOPSY;  Surgeon: Kerin Salen, MD;  Location: [facility] ENDOSCOPY;  Service: Gastroenterology;;   BOTOX INJECTION  08/28/2023   Procedure: BOTOX INJECTION;  Surgeon: Kerin Salen, MD;  Location: [facility] ENDOSCOPY;  Service: Gastroenterology;;   BRONCHIAL BIOPSY  01/23/2022   Procedure: BRONCHIAL BIOPSIES;  Surgeon: Josephine Igo, DO;  Location: [facility] ENDOSCOPY;  Service: Pulmonary;;   BRONCHIAL BIOPSY  08/07/2022   Procedure: BRONCHIAL BIOPSIES;  Surgeon: Josephine Igo, DO;  Location: [facility] ENDOSCOPY;  Service: Pulmonary;;   BRONCHIAL BRUSHINGS  08/07/2022   Procedure: BRONCHIAL BRUSHINGS;  Surgeon: Josephine Igo, DO;  Location: [facility] ENDOSCOPY;  Service: Pulmonary;;   BRONCHIAL NEEDLE ASPIRATION BIOPSY  01/23/2022   Procedure: BRONCHIAL NEEDLE ASPIRATION BIOPSIES;  Surgeon: Josephine Igo, DO;  Location: [facility] ENDOSCOPY;  Service: Pulmonary;;   BRONCHIAL NEEDLE ASPIRATION BIOPSY  08/07/2022   Procedure: BRONCHIAL NEEDLE ASPIRATION BIOPSIES;  Surgeon: Josephine Igo, DO;  Location: [facility] ENDOSCOPY;  Service: Pulmonary;;   COLON SURGERY  1995   DUE TO POLYP   DILATION AND CURETTAGE OF UTERUS     ENDOBRONCHIAL ULTRASOUND  01/23/2022   Procedure: ENDOBRONCHIAL ULTRASOUND;  Surgeon: Josephine Igo, DO;  Location: [facility] ENDOSCOPY;  Service: Pulmonary;;   ENTEROSCOPY N/A 07/10/2021   Procedure: ENTEROSCOPY;  Surgeon: Kerin Salen, MD;  Location[facility] ENDOSCOPY;  Service: Gastroenterology;  Laterality: N/A;   ESOPHAGEAL DILATION  08/28/2023   Procedure: ESOPHAGEAL BALLOON  DILATION;  Surgeon: Kerin Salen, MD;  Location: [facility] ENDOSCOPY;  Service: Gastroenterology;;   ESOPHAGOGASTRODUODENOSCOPY N/A 01/25/2021   Procedure: ESOPHAGOGASTRODUODENOSCOPY (EGD);  Surgeon: Vida Rigger, MD;  Location: [facility] ENDOSCOPY;  Service: Endoscopy;  Laterality: N/A;   ESOPHAGOGASTRODUODENOSCOPY (EGD) WITH PROPOFOL N/A 08/28/2023   Procedure: ESOPHAGOGASTRODUODENOSCOPY (EGD) WITH PROPOFOL;  Surgeon: Kerin Salen,  MD;  Location: [facility] ENDOSCOPY;  Service: Gastroenterology;  Laterality: N/A;   FINE NEEDLE ASPIRATION  01/23/2022   Procedure: FINE NEEDLE ASPIRATION (FNA) LINEAR;  Surgeon: Josephine Igo, DO;  [facility] ENDOSCOPY;  Service: Pulmonary;;   FOREARM FRACTURE SURGERY     Left arm   GIVENS CAPSULE STUDY N/A 07/07/2021   Procedure: GIVENS CAPSULE STUDY;  Surgeon: Vida Rigger, MD;  Location: [facility] ENDOSCOPY;  Service: Endoscopy;  Laterality: N/A;   HERNIA REPAIR     w/ mesh   HOT HEMOSTASIS N/A 07/10/2021   Procedure: HOT HEMOSTASIS (ARGON PLASMA COAGULATION/BICAP);  Surgeon: Kerin Salen, MD;  Location: [facility] ENDOSCOPY;  Service: Gastroenterology;  Laterality: N/A;   INCISION AND DRAINAGE ABSCESS Left 02/04/2013   Procedure: INCISION AND DRAINAGE LEFT BUTTOCK ABSCESS; INCISION AND DRAINAGE LEFT BREAST ABSCESS;  Surgeon: Shelly Rubenstein, MD;  Location: [facility] OR;  Service: General;  Laterality: Left;   INCISION AND DRAINAGE ABSCESS N/A 02/12/2013   Procedure: INCISION AND DEBRIDEMENT BUTTOCK WOUND ;  Surgeon:  Wilmon Arms. Corliss Skains, MD;  Location: [facility] OR;  Service: General;  Laterality: N/A;   INCISION AND DRAINAGE ABSCESS N/A 02/14/2013   Procedure: INCISION AND DRAINAGE/DRESSING CHANGE;  Surgeon: Shelly Rubenstein, MD;  Location: [facility] OR;  Service: General;  Laterality: N/A;   INCISION AND DRAINAGE ABSCESS N/A 03/21/2015   Procedure: INCISION AND DRAINAGE PUBIC ABSCESS;  Surgeon: Glenna Fellows, MD;  Location: [facility] ORS;  Service: General;  Laterality: N/A;   INCISION AND DRAINAGE PERIRECTAL ABSCESS Left 02/10/2013   Procedure: IRRIGATION AND DEBRIDEMENT OF BUTTOCK/PERINEAL ABSCESS;  Surgeon: Wilmon Arms. Corliss Skains, MD;  Location: [facility] OR;  Service: General;  Laterality: Left;   INCISION AND DRAINAGE PERIRECTAL ABSCESS N/A 02/16/2013   Procedure: IRRIGATION AND DEBRIDEMENT PERINEAL ABSCESS;  Surgeon: Liz Malady, MD;  Location: [facility] OR;  Service: General;  Laterality: N/A;   IR FLUORO GUIDE CV LINE RIGHT  11/30/2021   IR  FLUORO GUIDE CV LINE RIGHT  08/22/2023   IR REMOVAL TUN CV CATH W/O FL  08/29/2023   IR US GUIDE VASC ACCESS RIGHT  11/30/2021   IR US GUIDE VASC ACCESS RIGHT  08/22/2023   IRRIGATION AND DEBRIDEMENT ABSCESS N/A 02/06/2013   Procedure: IRRIGATION AND DEBRIDEMENT BUTTOCK ABSCESS AND DRESSING CHANGE;  Surgeon: Shelly Rubenstein, MD;  [facility];  Service: General;  Laterality: N/A;   IRRIGATION AND DEBRIDEMENT ABSCESS Left 02/08/2013   Procedure: IRRIGATION AND DEBRIDEMENT ABSCESS/DRESSING CHANGE;  Surgeon: Cherylynn Ridges, MD;  Location: [facility] OR;  Service: General;  Laterality: Left;   JOINT REPLACEMENT  2010 / 2012    LAPAROSCOPIC CHOLECYSTECTOMY     LEFT HEART CATH AND CORONARY ANGIOGRAPHY N/A 07/05/2023   Procedure: LEFT HEART CATH AND CORONARY ANGIOGRAPHY;  Surgeon: Lennette Bihari, MD;  Location: [facility] INVASIVE CV LAB;  Service: Cardiovascular;  Laterality: N/A;   LEFT HEART CATHETERIZATION WITH CORONARY ANGIOGRAM N/A 07/02/2014   Procedure: LEFT HEART CATHETERIZATION WITH CORONARY ANGIOGRAM;  Surgeon: Runell Gess, MD;  Location: [facility] CATH LAB;  Service: Cardiovascular;  Laterality: N/A;   LOWER EXTREMITY ANGIOGRAM Right 04/20/2016   Procedure: Lower Extremity Angiogram;  Surgeon: Sherren Kerns, MD;  Location: [facility] INVASIVE CV LAB;  Service: Cardiovascular;  Laterality: Right;   MULTIPLE TOOTH EXTRACTIONS     PERIPHERAL VASCULAR CATHETERIZATION N/A 04/20/2016   Procedure: Abdominal Aortogram;  Surgeon: Sherren Kerns, MD;  Location: [facility] INVASIVE CV LAB;  Service: Cardiovascular;  Laterality: N/A;   PERIPHERAL VASCULAR CATHETERIZATION Right 04/20/2016   Procedure: Peripheral Vascular Intervention;  Surgeon: Sherren Kerns, MD;  Location: [facility] INVASIVE CV LAB;  Service: Cardiovascular;  Laterality: Right;  popiteal   RIGHT HEART CATH N/A 10/27/2018   Procedure: RIGHT HEART CATH;  Surgeon: Laurey Morale, MD;  Location: [facility] INVASIVE CV LAB;  Service: Cardiovascular;  Laterality: N/A;   RIGHT HEART  CATH N/A 03/20/2019   Procedure: RIGHT HEART CATH;  Surgeon: Laurey Morale, MD;  Location: [facility] INVASIVE CV LAB;  Service: Cardiovascular;  Laterality: N/A;   THYROIDECTOMY     TOTAL HIP REVISION Right 08/11/2014   Procedure: RIGHT ACETABULAR REVISION;  Surgeon: Loanne Drilling, MD;  Location: [facility] ORS;  Service: Orthopedics;  Laterality: Right;   VASCULAR SURGERY     VIDEO BRONCHOSCOPY WITH ENDOBRONCHIAL ULTRASOUND Bilateral 08/07/2022   Procedure: VIDEO BRONCHOSCOPY WITH ENDOBRONCHIAL ULTRASOUND;  Surgeon: Josephine Igo, DO;  Location: [facility] ENDOSCOPY;  Service: Pulmonary;  Laterality: Bilateral;   VIDEO BRONCHOSCOPY WITH RADIAL ENDOBRONCHIAL ULTRASOUND  01/23/2022   Procedure:  RADIAL ENDOBRONCHIAL ULTRASOUND;  Surgeon: Josephine Igo, DO;  Location: [facility] ENDOSCOPY;  Service: Pulmonary;;     A IV Location/Drains/Wounds Patient Lines/Drains/Airways Status     Active Line/Drains/Airways     Name Placement date Placement time Site Days   Peripheral IV 09/22/23 18 G Anterior;Right Forearm 09/22/23  1056  Forearm  less than 1   Fistula / Graft Left Upper arm  04/05/20  1300  Upper arm  1265   Fistula / Graft Left Upper arm Arteriovenous vein graft 12/01/21  1444  Upper arm  660   Wound / Incision (Open or Dehisced) 08/21/23 Non-pressure wound Arm Lower;Posterior;Right red base c white/yellow scab intact, small amount of serous drainge 08/21/23  1621  Arm  32            Intake/Output Last 24 hours No intake or output data in the 24 hours ending 09/22/23 1617  Labs/Imaging Results for orders placed or performed during the hospital encounter of 09/22/23 (from the past 48 hours)  Comprehensive metabolic panel     Status: Abnormal   Collection Time: 09/22/23 10:56 AM  Result Value Ref Range   Sodium 137 135 - 145 mmol/L   Potassium 4.0 3.5 - 5.1 mmol/L   Chloride 96 (L) 98 - 111 mmol/L   CO2 29 22 - 32 mmol/L   Glucose, Bld 265 (H) 70 - 99 mg/dL    Comment: Glucose reference range  applies only to samples taken after fasting for at least 8 hours.   BUN 27 (H) 8 - 23 mg/dL   Creatinine, Ser 9.52 (H) 0.44 - 1.00 mg/dL   Calcium 9.7 8.9 - 84.1 mg/dL   Total Protein 7.0 6.5 - 8.1 g/dL   Albumin 3.4 (L) 3.5 - 5.0 g/dL   AST 20 15 - 41 U/L   ALT 12 0 - 44 U/L   Alkaline Phosphatase 68 38 - 126 U/L   Total Bilirubin 0.5 <1.2 mg/dL   GFR, Estimated 10 (L) >60 mL/min    Comment: (NOTE) Calculated using the CKD-EPI Creatinine Equation (2021)    Anion gap 12 5 - 15    Comment: Performed at [facility] Lab, [address] [place], [place] [address]  CBC with Differential     Status: Abnormal   Collection Time: 09/22/23 10:56 AM  Result Value Ref Range   WBC 8.8 4.0 - 10.5 K/uL   RBC 3.05 (L) 3.87 - 5.11 MIL/uL   Hemoglobin 9.3 (L) 12.0 - 15.0 g/dL   HCT 10.2 (L) 72.5 - 36.6 %   MCV 111.8 (H) 80.0 - 100.0 fL   MCH 30.5 26.0 - 34.0 pg   MCHC 27.3 (L) 30.0 - 36.0 g/dL   RDW 44.0 (H) 34.7 - 42.5 %   Platelets 292 150 - 400 K/uL   nRBC 0.8 (H) 0.0 - 0.2 %   Neutrophils Relative % 79 %   Neutro Abs 7.0 1.7 - 7.7 K/uL   Lymphocytes Relative 15 %   Lymphs Abs 1.3 0.7 - 4.0 K/uL   Monocytes Relative 6 %   Monocytes Absolute 0.5 0.1 - 1.0 K/uL   Eosinophils Relative 0 %   Eosinophils Absolute 0.0 0.0 - 0.5 K/uL   Basophils Relative 0 %   Basophils Absolute 0.0 0.0 - 0.1 K/uL   nRBC 1 (H) 0 /100 WBC   Abs Immature Granulocytes 0.00 0.00 - 0.07 K/uL   Polychromasia PRESENT    Basophilic Stippling PRESENT     Comment:  Performed at [facility], [address] [place], [place] [address]  Troponin I (High Sensitivity)     Status: Abnormal   Collection Time: 09/22/23 10:56 AM  Result Value Ref Range   Troponin I (High Sensitivity) 102 (HH) <18 ng/L    Comment: CRITICAL RESULT CALLED TO PAIGE Alishah Schulte, RN, READ BACK BY AND VERIFIED WITH W SMITH AT 1215 ON 12.15.2024 (NOTE) Elevated high sensitivity troponin I (hsTnI) values and significant  changes across serial  measurements may suggest ACS but many other  chronic and acute conditions are known to elevate hsTnI results.  Refer to the "Links" section for chest pain algorithms and additional  guidance. Performed at [facility], [address] [place], [place] [address]   Brain natriuretic peptide     Status: Abnormal   Collection Time: 09/22/23 10:56 AM  Result Value Ref Range   B Natriuretic Peptide 2,297.0 (H) 0.0 - 100.0 pg/mL    Comment: Performed at [facility], [address], [place], [place] [address]  I-Stat venous blood gas, ED     Status: Abnormal   Collection Time: 09/22/23 11:28 AM  Result Value Ref Range   pH, Ven 7.244 (L) 7.25 - 7.43   pCO2, Ven 77.1 (HH) 44 - 60 mmHg   pO2, Ven 159 (H) 32 - 45 mmHg   Bicarbonate 33.4 (H) 20.0 - 28.0 mmol/L   TCO2 36 (H) 22 - 32 mmol/L   O2 Saturation 99 %   Acid-Base Excess 4.0 (H) 0.0 - 2.0 mmol/L   Sodium 138 135 - 145 mmol/L   Potassium 4.0 3.5 - 5.1 mmol/L   Calcium, Ion 1.20 1.15 - 1.40 mmol/L   HCT 33.0 (L) 36.0 - 46.0 %   Hemoglobin 11.2 (L) 12.0 - 15.0 g/dL   Sample type VENOUS    Comment NOTIFIED PHYSICIAN   I-Stat venous blood gas, ED     Status: Abnormal   Collection Time: 09/22/23  1:13 PM  Result Value Ref Range   pH, Ven 7.090 (LL) 7.25 - 7.43   pCO2, Ven 71.2 (HH) 44 - 60 mmHg   pO2, Ven 47 (H) 32 - 45 mmHg   Bicarbonate 21.6 20.0 - 28.0 mmol/L   TCO2 24 22 - 32 mmol/L   O2 Saturation 65 %   Acid-base deficit 8.0 (H) 0.0 - 2.0 mmol/L   Sodium 147 (H) 135 - 145 mmol/L   Potassium 2.5 (LL) 3.5 - 5.1 mmol/L   Calcium, Ion 0.95 (L) 1.15 - 1.40 mmol/L   HCT 21.0 (L) 36.0 - 46.0 %   Hemoglobin 7.1 (L) 12.0 - 15.0 g/dL   Sample type VENOUS    Comment NOTIFIED PHYSICIAN    DG Chest Port 1 View Result Date: 09/22/2023 CLINICAL DATA:  SOB EXAM: PORTABLE CHEST 1 VIEW COMPARISON:  09/11/2023. FINDINGS: Cardiac silhouette is prominent. Left base consolidation and moderate effusion. Pulmonary vascular congestion  with evidence of interstitial edema. No pneumothorax. Calcified aorta. IMPRESSION: Findings consistent with CHF. Left base consolidation and effusion. Electronically Signed   By: Layla Maw M.D.   On: 09/22/2023 11:25    Pending Labs Unresulted Labs (From admission, onward)     Start     Ordered   09/23/23 0500  Basic metabolic panel  Tomorrow morning,   R        09/22/23 1328            Vitals/Pain Today's Vitals   09/22/23 1515 09/22/23 1530 09/22/23  1545 09/22/23 1600  BP: 123/71 (!) 107/58 (!) 121/58 125/63  Pulse: 80 83 81 85  Resp: 16 17 11 18   Temp:      TempSrc:      SpO2: 100% 100% 99% 100%  PainSc:        Isolation Precautions No active isolations  Medications Medications  allopurinol (ZYLOPRIM) tablet 100 mg (has no administration in time range)  colchicine tablet 0.6 mg (has no administration in time range)  atorvastatin (LIPITOR) tablet 20 mg (has no administration in time range)  levothyroxine (SYNTHROID) tablet 175 mcg (has no administration in time range)  sevelamer carbonate (RENVELA) tablet 1,600 mg (has no administration in time range)  apixaban (ELIQUIS) tablet 5 mg (has no administration in time range)  thiamine (VITAMIN B1) tablet 100 mg (has no administration in time range)  acetaminophen (TYLENOL) tablet 650 mg (has no administration in time range)    Or  acetaminophen (TYLENOL) suppository 650 mg (has no administration in time range)  ondansetron (ZOFRAN) tablet 4 mg (has no administration in time range)    Or  ondansetron (ZOFRAN) injection 4 mg (has no administration in time range)  insulin aspart (novoLOG) injection 0-24 Units (has no administration in time range)  Chlorhexidine Gluconate Cloth 2 % PADS 6 each (has no administration in time range)  pentafluoroprop-tetrafluoroeth (GEBAUERS) aerosol 1 Application (has no administration in time range)  lidocaine (PF) (XYLOCAINE) 1 % injection 5 mL (has no administration in time range)   lidocaine-prilocaine (EMLA) cream 1 Application (has no administration in time range)  feeding supplement (NEPRO CARB STEADY) liquid 237 mL (has no administration in time range)  heparin injection 1,000 Units (has no administration in time range)  anticoagulant sodium citrate solution 5 mL (has no administration in time range)  alteplase (CATHFLO ACTIVASE) injection 2 mg (has no administration in time range)  heparin injection 2,000 Units (has no administration in time range)  heparin injection 2,000 Units (has no administration in time range)    Mobility walks with person assist, has not ambulated since arriving to ED.     Focused Assessments Pulmonary Assessment Handoff:  Lung sounds: Bilateral Breath Sounds: Diminished O2 Device: Bi-PAP      R Recommendations: See Admitting Provider Note  Report given to: [phone]

## 2023-09-22 NOTE — ED Notes (Signed)
Pt. Reports feeling SOB during HD. RT at bedside for ABG and to assess. Pt. Endorses having some anxiety which is common during HD as well as being on Bipap. MD Dorrell made aware. Will continue to assess and monitor.

## 2023-09-22 NOTE — ED Triage Notes (Addendum)
Pt BIB [facility] from home d/t resp distress. Pt reports not using her BIPAP at home as she should have been but wearing 5L O2 via n/c & was 80% spO2 when EMS arrived. EMS reports 40 resp/min, wheezing in her "uppers" & diminished in the "lowers." Pt denies any ned Tx working at home, EMS did give 5mg  Duo-neb & 0.5mg  Atrovent while en route to ED, after nebs she was then having rales, she was placed on CPAP & her spO2 was 100 with that. Se was also given 0.4 nitroglycerin & reports she had been having some hallucinations in the past few days as well. Is a dialysis pt (schedule: Tu/Th/Sat) with Lt arm restrict, EMS was not able to get any access. A/Ox4 upon arrival to ED, 154/89, CBG 282.

## 2023-09-22 NOTE — Progress Notes (Signed)
RT called to bedside to place pt on BiPAP (servo).

## 2023-09-22 NOTE — Progress Notes (Signed)
Pt taken off BIPAP per MD and placed on 2L [place]. Sat 99%. NAD

## 2023-09-22 NOTE — ED Provider Notes (Signed)
EMERGENCY DEPARTMENT AT [facility] Provider Note   CSN: [id number] Arrival date & time: 09/22/23  1028     History  Chief Complaint  Patient presents with  . Respiratory Distress    [name] [name] is a 72 y.o. female.  Patient is a 72 year old female with a past medical history of ESRD, COPD on home O2, CHF A-fib on Eliquis, hypertension, diabetes presenting to the emergency department with respiratory distress.  Per EMS the patient is supposed to be on BiPAP at night and has not been wearing it for the last 3 days and when they arrived to her house she was off her home oxygen, tachypneic to the 40s and satting in the 70s.  They placed her on CPAP with improvement.  She was given 2 DuoNebs and route without significant change in her shortness of breath and was trialed on 1 nitro.  Patient denies missing any dialysis sessions.  She denies any chest pain.  She states that her shortness of breath is been worsening over the last couple of days.  Further history is limited due to her critical acuity.  The history is provided by the patient and the EMS personnel. The history is limited by the condition of the patient.       Home Medications Prior to Admission medications   Medication Sig Start Date End Date Taking? Authorizing Provider  acetaminophen (TYLENOL) 500 MG tablet Take 1 tablet (500 mg total) by mouth See admin instructions. Take 1,000mg  (2 tablets) by mouth twice a day in addition to 1,000mg  (2 tablets) twice daily as needed for pain. Patient taking differently: Take 1,000 mg by mouth in the morning and at bedtime. 05/14/23   Ghimire, Werner Lean, MD  albuterol (PROVENTIL) (2.5 MG/3ML) 0.083% nebulizer solution Take 3 mLs (2.5 mg total) by nebulization every 6 (six) hours as needed for wheezing or shortness of breath. Patient taking differently: Take 2.5 mg by nebulization 3 (three) times daily. 05/14/23   Ghimire, Werner Lean, MD  albuterol (VENTOLIN HFA) 108  (90 Base) MCG/ACT inhaler Inhale 2 puffs into the lungs every 6 (six) hours as needed for shortness of breath (COPD). 05/14/23   Ghimire, Werner Lean, MD  allopurinol (ZYLOPRIM) 100 MG tablet Take 1 tablet (100 mg total) by mouth daily. 09/11/23   Leroy Sea, MD  apixaban (ELIQUIS) 5 MG TABS tablet Take 1 tablet (5 mg total) by mouth 2 (two) times daily. 07/20/23   Ghimire, Werner Lean, MD  atorvastatin (LIPITOR) 20 MG tablet Take 1 tablet (20 mg total) by mouth daily. 09/14/23   Zannie Cove, MD  budesonide-formoterol [facility]) 160-4.5 MCG/ACT inhaler Inhale 2 puffs into the lungs in the morning and at bedtime. 05/14/23   Ghimire, Werner Lean, MD  colchicine 0.6 MG tablet Take 0.5 tablets (0.3 mg total) by mouth daily. 08/29/23   Leroy Sea, MD  diclofenac Sodium (VOLTAREN) 1 % GEL Apply 2 g topically 4 (four) times daily. Patient taking differently: Apply 1 Application topically daily. 04/17/23   Joseph Art, DO  docusate sodium (COLACE) 100 MG capsule Take 1 capsule (100 mg total) by mouth daily. 05/14/23   Ghimire, Werner Lean, MD  fluticasone (FLONASE) 50 MCG/ACT nasal spray Place 1 spray into both nostrils daily. Patient taking differently: Place 2 sprays into both nostrils daily. 05/14/23   Ghimire, Werner Lean, MD  insulin lispro (HUMALOG KWIKPEN) 100 UNIT/ML KwikPen 0-15 Units, Subcutaneous, 3 times daily with meal CBG < 70: Implement Hypoglycemia  meausers CBG 70 - 120: 0 units CBG 121 - 150: 2 units CBG 151 - 200: 3 units CBG 201 - 250: 5 units CBG 251 - 300: 8 units CBG 301 - 350: 11 units CBG 351 - 400: 15 units CBG > 400: call MD Patient taking differently: Inject 0-15 Units into the skin daily. 05/14/23   Ghimire, Werner Lean, MD  levothyroxine (SYNTHROID) 175 MCG tablet Take 1 tablet (175 mcg total) by mouth daily. 05/14/23   Ghimire, Werner Lean, MD  lidocaine-prilocaine (EMLA) cream Apply 1 Application topically See admin instructions. On dialysis days (Tues,Thurs,Sat) 02/13/22   [provider]  meclizine (ANTIVERT) 25 MG tablet Take 25 mg by mouth as needed for dizziness or nausea. 06/12/23   [provider]  metoprolol tartrate (LOPRESSOR) 25 MG tablet Take 1.5 tablets (37.5 mg total) by mouth 2 (two) times daily. Patient not taking: Reported on 09/10/2023 07/20/23   Maretta Bees, MD  Multiple Vitamins-Iron (MULTIVITAMINS WITH IRON) TABS tablet Take 1 tablet by mouth in the morning. 05/14/23   Ghimire, Werner Lean, MD  nitroGLYCERIN (NITROSTAT) 0.4 MG SL tablet Place 1 tablet (0.4 mg total) under the tongue every 5 (five) minutes as needed for chest pain. Patient not taking: Reported on 09/10/2023 07/06/23 07/05/24  Cyndi Bender, NP  OXYGEN Inhale 3 L into the lungs.    [provider]  rosuvastatin (CRESTOR) 20 MG tablet Take 1 tablet (20 mg total) by mouth daily. Patient not taking: Reported on 09/10/2023 07/06/23   Cyndi Bender, NP  sevelamer carbonate (RENVELA) 800 MG tablet Take 2 tablets (1,600 mg total) by mouth 3 (three) times daily with meals. 05/14/23   Ghimire, Werner Lean, MD  thiamine (VITAMIN B1) 100 MG tablet Take 1 tablet (100 mg total) by mouth in the morning. 05/14/23   Ghimire, Werner Lean, MD      Allergies    Ace inhibitors, Hydralazine, Nebivolol, Morphine and codeine, and Rosuvastatin    Review of Systems   Review of Systems  Physical Exam Updated Vital Signs BP (!) 127/44   Pulse 86   Temp 98.3 F (36.8 C) (Axillary)   Resp 13   SpO2 100%  Physical Exam Vitals and nursing note reviewed.  Constitutional:      General: She is not in acute distress.    Appearance: Normal appearance. She is obese.  HENT:     Head: Normocephalic and atraumatic.     Nose: Nose normal.     Mouth/Throat:     Mouth: Mucous membranes are moist.     Pharynx: Oropharynx is clear.  Eyes:     Extraocular Movements: Extraocular movements intact.     Conjunctiva/sclera: Conjunctivae normal.  Cardiovascular:     Rate and Rhythm: Normal rate and regular  rhythm.     Heart sounds: Normal heart sounds.  Pulmonary:     Comments: Tachypneic, speaking in 1-2 word sentences, no signficant retractions Trace end expiratory wheeze Diffuse crackles Abdominal:     General: Abdomen is flat.     Palpations: Abdomen is soft.     Tenderness: There is no abdominal tenderness.  Musculoskeletal:        General: Normal range of motion.     Cervical back: Normal range of motion.     Right lower leg: Edema (trace, non-pitting) present.     Left lower leg: Edema (trace, non-pitting) present.  Skin:    General: Skin is warm and dry.  Neurological:  General: No focal deficit present.     Mental Status: She is alert and oriented to person, place, and time.  Psychiatric:        Mood and Affect: Mood normal.        Behavior: Behavior normal.     ED Results / Procedures / Treatments   Labs (all labs ordered are listed, but only abnormal results are displayed) Labs Reviewed  COMPREHENSIVE METABOLIC PANEL - Abnormal; Notable for the following components:      Result Value   Chloride 96 (*)    Glucose, Bld 265 (*)    BUN 27 (*)    Creatinine, Ser 4.63 (*)    Albumin 3.4 (*)    GFR, Estimated 10 (*)    All other components within normal limits  CBC WITH DIFFERENTIAL/PLATELET - Abnormal; Notable for the following components:   RBC 3.05 (*)    Hemoglobin 9.3 (*)    HCT 34.1 (*)    MCV 111.8 (*)    MCHC 27.3 (*)    RDW 16.5 (*)    nRBC 0.8 (*)    nRBC 1 (*)    All other components within normal limits  BRAIN NATRIURETIC PEPTIDE - Abnormal; Notable for the following components:   B Natriuretic Peptide 2,297.0 (*)    All other components within normal limits  I-STAT VENOUS BLOOD GAS, ED - Abnormal; Notable for the following components:   pH, Ven 7.244 (*)    pCO2, Ven 77.1 (*)    pO2, Ven 159 (*)    Bicarbonate 33.4 (*)    TCO2 36 (*)    Acid-Base Excess 4.0 (*)    HCT 33.0 (*)    Hemoglobin 11.2 (*)    All other components within normal  limits  TROPONIN I (HIGH SENSITIVITY) - Abnormal; Notable for the following components:   Troponin I (High Sensitivity) 102 (*)    All other components within normal limits  I-STAT VENOUS BLOOD GAS, ED  TROPONIN I (HIGH SENSITIVITY)    EKG EKG Interpretation Date/Time:  Sunday September 22 2023 10:36:10 EST Ventricular Rate:  103 PR Interval:  177 QRS Duration:  156 QT Interval:  393 QTC Calculation: 515 R Axis:   -24  Text Interpretation: Sinus tachycardia IVCD, consider atypical LBBB No significant change since last tracing Confirmed by Elayne Snare ([id number]) on 09/22/2023 10:42:48 AM  Radiology DG Chest Port 1 View Result Date: 09/22/2023 CLINICAL DATA:  SOB EXAM: PORTABLE CHEST 1 VIEW COMPARISON:  09/11/2023. FINDINGS: Cardiac silhouette is prominent. Left base consolidation and moderate effusion. Pulmonary vascular congestion with evidence of interstitial edema. No pneumothorax. Calcified aorta. IMPRESSION: Findings consistent with CHF. Left base consolidation and effusion. Electronically Signed   By: Layla Maw M.D.   On: 09/22/2023 11:25    Procedures .Critical Care  Performed by: Rexford Maus, DO Authorized by: Rexford Maus, DO   Critical care provider statement:    Critical care time (minutes):  30   Critical care was necessary to treat or prevent imminent or life-threatening deterioration of the following conditions:  Respiratory failure   Critical care was time spent personally by me on the following activities:  Development of treatment plan with patient or surrogate, discussions with consultants, evaluation of patient's response to treatment, examination of patient, ordering and review of laboratory studies, ordering and review of radiographic studies, ordering and performing treatments and interventions, pulse oximetry, re-evaluation of patient's condition and review of old charts     Medications Ordered  in ED Medications - No data to  display  ED Course/ Medical Decision Making/ A&P Clinical Course as of 09/22/23 1336  Sun Sep 22, 2023  1135 Respiratory acidosis on VBG, will adjust bipap settings [VK]  1217 Elevated BNP and troponin though trop lower compared to baseline. Suspect demand in the setting of CHF. Will contact nephrology for HD and plan for admission. [VK]  1303 Current bipap settings 14/5 [VK]  1336 I spoke with Dr. Arlean Hopping with neprhology who will evaluate the patient. Worsening acidosis on VBG. I messaged respiratory to increase bipap setttings to 18/8. [VK]    Clinical Course User Index [VK] Rexford Maus, DO                                 Medical Decision Making This patient presents to the ED with chief complaint(s) of respiratory distress with pertinent past medical history of ESRD, HTN, DM, CHF, COPD which further complicates the presenting complaint. The complaint involves an extensive differential diagnosis and also carries with it a high risk of complications and morbidity.    The differential diagnosis includes COPD exacerbation, ACS, arrhythmia, anemia, pneumonia, pneumothorax, pulmonary edema, pleural effusion  Additional history obtained: Additional history obtained from EMS  Records reviewed previous admission documents  ED Course and Reassessment: Patient's arrival she was satting well and breathing comfortably on CPAP and was transferred over to our BiPAP machine.  She is only trace end expiratory wheeze but sounds significantly crackly and wet on exam.  Will have EKG, labs and chest x-ray performed.  Likely will need dialysis today for further diuresis and will be closely reassessed.  Independent labs interpretation:  The following labs were independently interpreted: respiratory acidosis, elevated troponin and BNP, otherwise labs at baseline  Independent visualization of imaging: - I independently visualized the following imaging with scope of interpretation limited to  determining acute life threatening conditions related to emergency care: CXR, which revealed pulmonary edema/vascular congestion  Consultation: - Consulted or discussed management/test interpretation w/ external professional: nephrology, hospitalist  Consideration for admission or further workup: patient requires admission for volume overload and respiratory failure Social Determinants of health: N/A    Amount and/or Complexity of Data Reviewed Labs: ordered. Radiology: ordered.  Risk Decision regarding hospitalization.          Final Clinical Impression(s) / ED Diagnoses Final diagnoses:  Acute respiratory failure with hypoxia and hypercapnia (HCC)  Acute on chronic congestive heart failure, unspecified heart failure type [facility])    Rx / DC Orders ED Discharge Orders     None         Rexford Maus, DO 09/22/23 1309

## 2023-09-22 NOTE — ED Notes (Signed)
No ordered PO meds given to pt yet d/t still being on BIPAP & also they have not been verified yet at this point.

## 2023-09-22 NOTE — Procedures (Signed)
I have reviewed the HD regimen and made appropriate changes.  [name] MD  [facility] 09/22/2023, 6:14 PM

## 2023-09-22 NOTE — ED Notes (Signed)
Called to bedside by dialysis nurse for pt c/o "can't breathe" pt vital signs stable, bipap in place and pt tolerating well without issue, breath sounds shallow but clear bilaterally to auscultation at this time. RT already requested to bedside and present, and states respiratory status stable. Per Dialysis RN at bedside pt usually receives anti-anxiety medication while she's hospitalized for acute panic and anxiety, no PRN orders for anti-anxiety meds at this time. Primary RN Lilly updated.

## 2023-09-23 ENCOUNTER — Other Ambulatory Visit (HOSPITAL_COMMUNITY): Payer: 59

## 2023-09-23 ENCOUNTER — Inpatient Hospital Stay (HOSPITAL_COMMUNITY): Payer: 59

## 2023-09-23 DIAGNOSIS — J9621 Acute and chronic respiratory failure with hypoxia: Secondary | ICD-10-CM | POA: Diagnosis not present

## 2023-09-23 DIAGNOSIS — J9622 Acute and chronic respiratory failure with hypercapnia: Secondary | ICD-10-CM

## 2023-09-23 LAB — BASIC METABOLIC PANEL
Anion gap: 13 (ref 5–15)
BUN: 20 mg/dL (ref 8–23)
CO2: 31 mmol/L (ref 22–32)
Calcium: 9.7 mg/dL (ref 8.9–10.3)
Chloride: 91 mmol/L — ABNORMAL LOW (ref 98–111)
Creatinine, Ser: 3.38 mg/dL — ABNORMAL HIGH (ref 0.44–1.00)
GFR, Estimated: 14 mL/min — ABNORMAL LOW (ref >60)
Glucose, Bld: 275 mg/dL — ABNORMAL HIGH (ref 70–99)
Potassium: 3.6 mmol/L (ref 3.5–5.1)
Sodium: 135 mmol/L (ref 135–145)

## 2023-09-23 LAB — GLUCOSE, CAPILLARY
Glucose-Capillary: 199 mg/dL — ABNORMAL HIGH (ref 70–99)
Glucose-Capillary: 185 mg/dL — ABNORMAL HIGH (ref 70–99)
Glucose-Capillary: 186 mg/dL — ABNORMAL HIGH (ref 70–99)
Glucose-Capillary: 222 mg/dL — ABNORMAL HIGH (ref 70–99)

## 2023-09-23 LAB — MRSA NEXT GEN BY PCR, NASAL: MRSA by PCR Next Gen: NOT DETECTED

## 2023-09-23 MED ORDER — CHLORHEXIDINE GLUCONATE CLOTH 2 % EX PADS
6.0000 | MEDICATED_PAD | Freq: Every day | CUTANEOUS | Status: DC
  Administered 2023-09-23: 6 via TOPICAL

## 2023-09-23 MED ORDER — INSULIN ASPART 100 UNIT/ML IJ SOLN
0.0000 [IU] | Freq: Every day | INTRAMUSCULAR | Status: DC
  Administered 2023-09-23: 2 [IU] via SUBCUTANEOUS
  Administered 2023-09-25: 3 [IU] via SUBCUTANEOUS

## 2023-09-23 MED ORDER — INSULIN ASPART 100 UNIT/ML IJ SOLN
0.0000 [IU] | Freq: Three times a day (TID) | INTRAMUSCULAR | Status: DC
  Administered 2023-09-23: 1 [IU] via SUBCUTANEOUS
  Administered 2023-09-24 (×2): 2 [IU] via SUBCUTANEOUS
  Administered 2023-09-24: 1 [IU] via SUBCUTANEOUS
  Administered 2023-09-25 – 2023-09-26 (×3): 3 [IU] via SUBCUTANEOUS
  Administered 2023-09-26 – 2023-09-27 (×3): 2 [IU] via SUBCUTANEOUS

## 2023-09-23 MED ORDER — CHLORHEXIDINE GLUCONATE CLOTH 2 % EX PADS
6.0000 | MEDICATED_PAD | Freq: Every day | CUTANEOUS | Status: DC
  Administered 2023-09-24 – 2023-09-27 (×4): 6 via TOPICAL

## 2023-09-23 NOTE — Progress Notes (Signed)
   09/23/23 0159  Vent Select  Invasive or Noninvasive Noninvasive  Adult Vent Y  Adult Ventilator Settings  Vent Type Servo i  Vent Mode BIPAP;PCV  Set Rate 14 bmp  FiO2 (%) 40 %  IPAP 15 cmH20  EPAP 5 cmH20  Pressure Control 10 cmH20  PEEP 5 cmH20  Adult Ventilator Measurements  Peak Airway Pressure 17 L/min  Mean Airway Pressure 10 cmH20  Resp Rate Spontaneous 4 br/min  Resp Rate Total 18 br/min  Exhaled Vt 491 mL  Measured Ve 11.2 L  I:E Ratio Measured 1:2.0  Auto PEEP 0 cmH20  Total PEEP 5 cmH20  SpO2 94 %  Adult Ventilator Alarms  Alarms On Y  Ve High Alarm 20 L/min  Ve Low Alarm 5 L/min  Resp Rate High Alarm 36 br/min  Resp Rate Low Alarm 10  PEEP Low Alarm 3 cmH2O  Press High Alarm 25 cmH2O  Breath Sounds  Bilateral Breath Sounds Diminished  Vent Respiratory Assessment  Level of Consciousness Alert  Respiratory Pattern Regular;Labored

## 2023-09-23 NOTE — Inpatient Diabetes Management (Signed)
Inpatient Diabetes Program Recommendations  AACE/ADA: New Consensus Statement on Inpatient Glycemic Control (2015)  Target Ranges:  Prepandial:   less than 140 mg/dL      Peak postprandial:   less than 180 mg/dL (1-2 hours)      Critically ill patients:  140 - 180 mg/dL   Lab Results  Component Value Date   GLUCAP 185 (H) 09/23/2023   HGBA1C 7.1 (H) 04/13/2023    Review of Glycemic Control  Latest Reference Range & Units 09/22/23 23:20 09/23/23 07:39 09/23/23 11:54  Glucose-Capillary 70 - 99 mg/dL 161 (H) 096 (H) 045 (H)   Diabetes history: DM 2 Outpatient Diabetes medications:  Humalog 0-15 units tid with meals  Current orders for Inpatient glycemic control:  Novolog 0-24 units tid with  meals and HS  Inpatient Diabetes Program Recommendations:    Please consider reducing Novolog correction to sensitive tid with meals and HS scale (instead of Novolog 0-24 units tid with meals and HS) due to renal failure.   Thanks,  Beryl Meager, RN, BC-ADM Inpatient Diabetes Coordinator Pager [phone]  (8a-5p)

## 2023-09-23 NOTE — Plan of Care (Signed)

## 2023-09-23 NOTE — Progress Notes (Signed)
PROGRESS NOTE    [name] [name]  EPP:[id number] DOB: 08/02/1951 DOA: 09/22/2023 PCP: Vladimir Crofts, FNP    Brief Narrative:  72 year old female with ESRD on hemodialysis, COPD, type 1 diabetes on insulin, hypertension, hypothyroidism, hyperlipidemia, sleep apnea not using CPAP currently with recurrent hospitalizations for hypoxemia and respiratory distress, fluid overload presented back to the emergency room with recurrence of worsening respiratory distress at home.  Apparently, she was not wearing oxygen at home.  When she was brought to the emergency room she was hemodynamically stable.  VBG with pH of 7.09, CO2 77.  Was with increased work of breathing , started on BiPAP and admitted to the hospital.  Also received hemodialysis. Subjective: Patient seen and examined in the morning rounds.  She looked hyperventilating and was not able to speak in full sentences.  After much effort, she was able to tell me that she gets hallucinations at home and does not get hallucinations in the hospital.  Patient tells me that she always have hard time breathing and this is more or less her baseline.  She was not able to explain any other symptoms. Assessment & Plan:   Acute on chronic hypoxemic and hypercapnic respiratory failure Multifactorial. ESRD on hemodialysis with fluid overload Obesity hypoventilation syndrome, ineffective use of CPAP at home Chronic systolic heart failure with known ejection fraction 20 to 25% Severe pulmonary hypertension. Recurrent hypercarbia  Keep on oxygen to keep saturation more than 90%.  Keep on BiPAP as needed for work of breathing and hypercapnia.  She will need to stay on BiPAP most of the time. Continue supportive care. Received hemodialysis 12/15 for fluid removal.  May need additional dialysis. Repeat chest x-ray was ordered, left-sided pleural effusion.  Poorly ventilated lungs. Will consult palliative care.  Chronic medical issues including Anemia of  chronic disease, hemoglobin is stable Coronary artery disease, currently stable. Type 2 diabetes, on insulin.  Reduced dose of insulin with risk of hypoglycemia. Paroxysmal A-fib, currently sinus rhythm.  On metoprolol and therapeutic on Eliquis. Hypothyroidism, on Synthroid.  Goal of care: Patient with extensive medical issues and risk of decompensation.  Will consult palliative care team.  DVT prophylaxis: SCDs Start: 09/22/23 1327 apixaban (ELIQUIS) tablet 5 mg   Code Status: DNR with limited intervention Family Communication: None at the bedside Disposition Plan: Status is: Inpatient Remains inpatient appropriate because: Severe decompensated respiratory failure, continuous BiPAP     Consultants:  Nephrology  Procedures:  None  Antimicrobials:  None     Objective: Vitals:   09/23/23 0737 09/23/23 0742 09/23/23 1149 09/23/23 1150  BP: 132/66  122/66   Pulse:  96 85 85  Resp:  (!) 23 20 18   Temp: 98 F (36.7 C)  98.4 F (36.9 C)   TempSrc: Axillary  Axillary   SpO2:  96%  98%  Weight:      Height:        Intake/Output Summary (Last 24 hours) at 09/23/2023 1320 Last data filed at 09/23/2023 0910 Gross per 24 hour  Intake 490 ml  Output 2400 ml  Net -1910 ml   Filed Weights   09/22/23 2316  Weight: 97.5 kg    Examination:  General exam: Sick looking.  In moderate respiratory distress.  Unable to talk in complete sentences. Respiratory system: No added sounds.  Poor air entry.  Poor inspiratory efforts.  Initially on 3 L of oxygen.  Placed on BiPAP. Cardiovascular system: S1 & S2 heard, RRR. Gastrointestinal system: Soft.  Distended.  Nontender. Central nervous system: Alert and oriented.  Anxious.  Flat affect.    Data Reviewed: I have personally reviewed following labs and imaging studies  CBC: Recent Labs  Lab 09/22/23 1056 09/22/23 1128 09/22/23 1313 09/22/23 2017 09/22/23 2251  WBC 8.8  --   --   --   --   NEUTROABS 7.0  --   --   --    --   HGB 9.3* 11.2* 7.1* 11.9* 11.9*  HCT 34.1* 33.0* 21.0* 35.0* 35.0*  MCV 111.8*  --   --   --   --   PLT 292  --   --   --   --    Basic Metabolic Panel: Recent Labs  Lab 09/22/23 1056 09/22/23 1128 09/22/23 1313 09/22/23 2017 09/22/23 2251 09/23/23 0422  NA 137 138 147* 133* 128* 135  K 4.0 4.0 2.5* 3.8 5.4* 3.6  CL 96*  --   --   --   --  91*  CO2 29  --   --   --   --  31  GLUCOSE 265*  --   --   --   --  275*  BUN 27*  --   --   --   --  20  CREATININE 4.63*  --   --   --   --  3.38*  CALCIUM 9.7  --   --   --   --  9.7   GFR: Estimated Creatinine Clearance: 16.7 mL/min (A) (by C-G formula based on SCr of 3.38 mg/dL (H)). Liver Function Tests: Recent Labs  Lab 09/22/23 1056  AST 20  ALT 12  ALKPHOS 68  BILITOT 0.5  PROT 7.0  ALBUMIN 3.4*   No results for input(s): "LIPASE", "AMYLASE" in the last 168 hours. No results for input(s): "AMMONIA" in the last 168 hours. Coagulation Profile: No results for input(s): "INR", "PROTIME" in the last 168 hours. Cardiac Enzymes: No results for input(s): "CKTOTAL", "CKMB", "CKMBINDEX", "TROPONINI" in the last 168 hours. BNP (last 3 results) No results for input(s): "PROBNP" in the last 8760 hours. HbA1C: No results for input(s): "HGBA1C" in the last 72 hours. CBG: Recent Labs  Lab 09/22/23 2320 09/23/23 0739 09/23/23 1154  GLUCAP 142* 199* 185*   Lipid Profile: No results for input(s): "CHOL", "HDL", "LDLCALC", "TRIG", "CHOLHDL", "LDLDIRECT" in the last 72 hours. Thyroid Function Tests: No results for input(s): "TSH", "T4TOTAL", "FREET4", "T3FREE", "THYROIDAB" in the last 72 hours. Anemia Panel: No results for input(s): "VITAMINB12", "FOLATE", "FERRITIN", "TIBC", "IRON", "RETICCTPCT" in the last 72 hours. Sepsis Labs: No results for input(s): "PROCALCITON", "LATICACIDVEN" in the last 168 hours.  Recent Results (from the past 240 hours)  MRSA Next Gen by PCR, Nasal     Status: None   Collection Time: 09/23/23   2:08 AM   Specimen: Nasal Mucosa; Nasal Swab  Result Value Ref Range Status   MRSA by PCR Next Gen NOT DETECTED NOT DETECTED Final    Comment: (NOTE) The GeneXpert MRSA Assay (FDA approved for NASAL specimens only), is one component of a comprehensive MRSA colonization surveillance program. It is not intended to diagnose MRSA infection nor to guide or monitor treatment for MRSA infections. Test performance is not FDA approved in patients less than 78 years old. Performed at [name], [address] [place], [place] [address]          Radiology Studies: DG Chest Port 1 View Result Date: 09/22/2023 CLINICAL DATA:  SOB  EXAM: PORTABLE CHEST 1 VIEW COMPARISON:  09/11/2023. FINDINGS: Cardiac silhouette is prominent. Left base consolidation and moderate effusion. Pulmonary vascular congestion with evidence of interstitial edema. No pneumothorax. Calcified aorta. IMPRESSION: Findings consistent with CHF. Left base consolidation and effusion. Electronically Signed   By: Layla Maw M.D.   On: 09/22/2023 11:25        Scheduled Meds:  apixaban  5 mg Oral BID   Chlorhexidine Gluconate Cloth  6 each Topical Daily   clopidogrel  75 mg Oral Daily   colchicine  0.6 mg Oral Daily   fluticasone furoate-vilanterol  1 puff Inhalation Daily   insulin aspart  0-5 Units Subcutaneous QHS   insulin aspart  0-6 Units Subcutaneous TID WC   levothyroxine  175 mcg Oral Q0600   midodrine  15 mg Oral Q8H   Continuous Infusions:  anticoagulant sodium citrate       LOS: 1 day    Time spent: 35 minutes    Dorcas Carrow, MD [facility]

## 2023-09-23 NOTE — Progress Notes (Signed)
   09/23/23 0343  Assess: MEWS Score  Temp 99 F (37.2 C)  BP 92/70  MAP (mmHg) 78  Pulse Rate (!) 107  ECG Heart Rate (!) 107  Resp (!) 22  SpO2 95 %  O2 Device Nasal Cannula  O2 Flow Rate (L/min) 4 L/min  FiO2 (%) 21 %  Assess: MEWS Score  MEWS Temp 0  MEWS Systolic 1  MEWS Pulse 1  MEWS RR 1  MEWS LOC 0  MEWS Score 3  MEWS Score Color Yellow  Assess: if the MEWS score is Yellow or Red  Were vital signs accurate and taken at a resting state? Yes  Does the patient meet 2 or more of the SIRS criteria? Yes  Does the patient have a confirmed or suspected source of infection? No  MEWS guidelines implemented  Yes, yellow  Treat  MEWS Interventions Considered administering scheduled or prn medications/treatments as ordered  Take Vital Signs  Increase Vital Sign Frequency  Yellow: Q2hr x1, continue Q4hrs until patient remains green for 12hrs  Escalate  MEWS: Escalate Yellow: Discuss with charge nurse and consider notifying provider and/or RRT  Notify: Charge Nurse/RN  Name of Charge Nurse/RN Notified Lennice Sites  Provider Notification  Provider Name/Title Dr Antionette Char  Date Provider Notified 09/23/23  Time Provider Notified 0425  Method of Notification Call (secure chat)  Notification Reason Other (Comment) (new yellow mews)  Provider response Other (Comment) (no new orders at this time)  Date of Provider Response 09/23/23  Time of Provider Response 0428  Assess: SIRS CRITERIA  SIRS Temperature  0  SIRS Respirations  1  SIRS Pulse 1  SIRS WBC 0  SIRS Score Sum  2

## 2023-09-23 NOTE — Progress Notes (Signed)
Pt pulled BIPAP off again and refused to put it back on. Made RT aware.

## 2023-09-23 NOTE — Progress Notes (Addendum)
[facility] Progress Note  Subjective: pt seen in room, off bipap for the moment and on [place] O2  Vitals:   09/23/23 0742 09/23/23 1149 09/23/23 1150 09/23/23 1505  BP:  122/66  (!) 157/71  Pulse: 96 85 85 98  Resp: (!) 23 20 18 17   Temp:  98.4 F (36.9 C)    TempSrc:  Axillary    SpO2: 96%  98% 98%  Weight:      Height:        Exam: Gen alert, tired, off bipap on [place] O2 at this time No rash, cyanosis or gangrene Sclera anicteric, throat clear  No jvd or bruits Chest dec'd BS at bases, occ rales RRR no RG Abd soft ntnd no mass or ascites +bs GU defer MS no joint effusions or deformity Ext no sig LE or UE edema, no wounds or ulcers Neuro is alert, Ox 3 , tired, hard to understand due to low voice    LUA AVG+bruit     Renal-related home meds: - eliquis 5 bid - metoprolol 37.5mg  bid - renvela 2 ac tid - O2 3L [place]      OP HD: [facility]  4h  400/600  96.5kg  2/2 bath  LUA AVG  Heparin 2000+ 2000 midrun       Assessment/ Plan: 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  Also happened in October, same problem, and was here for 9 days. Also happened in July when she was here for 9 days. Pt got HD in ED yesterday w/ 2.4 L removed using IV alb for BP support.  Hypotension - cont midodrine at 15 tid.  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 Had HD here yesterday. Next HD tomorrow.  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  MBD ckd - CCa  in range, add on phos.  pHTN/ chronic syst CHF - both are worsening per recent cardiology assessment on last admission       [name] MD  [facility] 09/23/2023, 3:47 PM  Recent Labs  Lab 09/22/23 1056 09/22/23 1128 09/22/23 2017 09/22/23 2251 09/23/23 0422  HGB 9.3*   < > 11.9* 11.9*  --   ALBUMIN 3.4*  --   --   --   --   CALCIUM  9.7  --   --   --  9.7  CREATININE 4.63*  --   --   --  3.38*  K 4.0   < > 3.8 5.4* 3.6   < > = values in this interval not displayed.   No results for input(s): "IRON", "TIBC", "FERRITIN" in the last 168 hours. Inpatient medications:  apixaban  5 mg Oral BID   Chlorhexidine Gluconate Cloth  6 each Topical Daily   clopidogrel  75 mg Oral Daily   colchicine  0.6 mg Oral Daily   fluticasone furoate-vilanterol  1 puff Inhalation Daily   insulin aspart  0-5 Units Subcutaneous QHS   insulin aspart  0-6 Units Subcutaneous TID WC   levothyroxine  175 mcg Oral Q0600   midodrine  15 mg Oral Q8H    anticoagulant sodium citrate     acetaminophen **OR** acetaminophen, alteplase, anticoagulant sodium citrate, feeding supplement (NEPRO CARB STEADY), heparin, heparin, lidocaine (PF), lidocaine-prilocaine, ondansetron **OR** ondansetron (  ZOFRAN) IV, pentafluoroprop-tetrafluoroeth

## 2023-09-23 NOTE — Progress Notes (Signed)
   09/23/23 2305  Vent Select  Invasive or Noninvasive Noninvasive  Adult Vent Y  Adult Ventilator Settings  Vent Type Servo i  Vent Mode BIPAP;PCV  Set Rate 14 bmp  FiO2 (%) 40 %  I Time 0.9 Sec(s)  IPAP 16 cmH20  EPAP 5 cmH20  Pressure Control 11 cmH20  PEEP 5 cmH20  Adult Ventilator Measurements  Peak Airway Pressure 17 L/min  Mean Airway Pressure 8 cmH20  Resp Rate Spontaneous 7 br/min  Resp Rate Total 21 br/min  Exhaled Vt 407 mL  Measured Ve 8.2 L  I:E Ratio Measured 1:2.0  Auto PEEP 0 cmH20  Total PEEP 5 cmH20  Adult Ventilator Alarms  Alarms On Y  Ve High Alarm 20 L/min  Ve Low Alarm 4 L/min  Resp Rate High Alarm 36 br/min  Resp Rate Low Alarm 10  PEEP Low Alarm 2 cmH2O  Press High Alarm 25 cmH2O  VAP Prevention  HOB> 30 Degrees Y  Breath Sounds  Bilateral Breath Sounds Diminished  Vent Respiratory Assessment  Level of Consciousness Alert  Respiratory Pattern Regular;Unlabored;Symmetrical

## 2023-09-23 NOTE — Progress Notes (Signed)
   09/23/23 0343  Assess: MEWS Score  Temp 99 F (37.2 C)  BP 92/70  MAP (mmHg) 78  Pulse Rate (!) 107  ECG Heart Rate (!) 107  Resp (!) 22  SpO2 95 %  O2 Device Nasal Cannula  O2 Flow Rate (L/min) 4 L/min  FiO2 (%) 21 %  Assess: MEWS Score  MEWS Temp 0  MEWS Systolic 1  MEWS Pulse 1  MEWS RR 1  MEWS LOC 0  MEWS Score 3  MEWS Score Color Yellow  Assess: if the MEWS score is Yellow or Red  Were vital signs accurate and taken at a resting state? Yes  Does the patient meet 2 or more of the SIRS criteria? Yes  Does the patient have a confirmed or suspected source of infection? No  MEWS guidelines implemented  No, previously yellow, continue vital signs every 4 hours  Notify: Charge Nurse/RN  Name of Charge Nurse/RN Notified Lennice Sites  Provider Notification  Provider Name/Title Dr Antionette Char  Date Provider Notified 09/23/23  Time Provider Notified 0425  Method of Notification Call (secure chat)  Notification Reason Other (Comment) (new yellow mews)  Assess: SIRS CRITERIA  SIRS Temperature  0  SIRS Respirations  1  SIRS Pulse 1  SIRS WBC 0  SIRS Score Sum  2

## 2023-09-23 NOTE — Progress Notes (Addendum)
Pt took bipap off, states she is hot and just can't wear it anymore right now. O2 applied via [place] 4L, pt maintaining sats of 95. RR21.  However she appears to be gasping for breath when she talks. When she is not talking, she appears to breathe normally. Blanca RT made aware-will give pt a break off Bipap for now and encourage her to wear it again shortly.

## 2023-09-23 NOTE — Progress Notes (Signed)
Pt receives out-pt HD at [facility] on TTS. Will assist as needed.   Veleta Yamamoto Renal Navigator [phone] 

## 2023-09-24 DIAGNOSIS — J9621 Acute and chronic respiratory failure with hypoxia: Secondary | ICD-10-CM | POA: Diagnosis not present

## 2023-09-24 DIAGNOSIS — E8770 Fluid overload, unspecified: Secondary | ICD-10-CM

## 2023-09-24 DIAGNOSIS — Z7189 Other specified counseling: Secondary | ICD-10-CM

## 2023-09-24 DIAGNOSIS — Z515 Encounter for palliative care: Secondary | ICD-10-CM

## 2023-09-24 DIAGNOSIS — J9622 Acute and chronic respiratory failure with hypercapnia: Secondary | ICD-10-CM | POA: Diagnosis not present

## 2023-09-24 LAB — GLUCOSE, CAPILLARY
Glucose-Capillary: 244 mg/dL — ABNORMAL HIGH (ref 70–99)
Glucose-Capillary: 231 mg/dL — ABNORMAL HIGH (ref 70–99)
Glucose-Capillary: 190 mg/dL — ABNORMAL HIGH (ref 70–99)
Glucose-Capillary: 169 mg/dL — ABNORMAL HIGH (ref 70–99)

## 2023-09-24 LAB — RENAL FUNCTION PANEL
Albumin: 3.4 g/dL — ABNORMAL LOW (ref 3.5–5.0)
Anion gap: 11 (ref 5–15)
BUN: 49 mg/dL — ABNORMAL HIGH (ref 8–23)
CO2: 29 mmol/L (ref 22–32)
Calcium: 9.8 mg/dL (ref 8.9–10.3)
Chloride: 92 mmol/L — ABNORMAL LOW (ref 98–111)
Creatinine, Ser: 5.58 mg/dL — ABNORMAL HIGH (ref 0.44–1.00)
GFR, Estimated: 8 mL/min — ABNORMAL LOW (ref >60)
Glucose, Bld: 161 mg/dL — ABNORMAL HIGH (ref 70–99)
Phosphorus: 5.6 mg/dL — ABNORMAL HIGH (ref 2.5–4.6)
Potassium: 3.9 mmol/L (ref 3.5–5.1)
Sodium: 132 mmol/L — ABNORMAL LOW (ref 135–145)

## 2023-09-24 LAB — BLOOD GAS, ARTERIAL
Acid-base deficit: 1.9 mmol/L (ref 0.0–2.0)
Bicarbonate: 31.7 mmol/L — ABNORMAL HIGH (ref 20.0–28.0)
Drawn by: 441371
O2 Saturation: 92.3 %
Patient temperature: 36.4
pCO2 arterial: 109 mm[Hg] (ref 32–48)
pH, Arterial: 7.07 — CL (ref 7.35–7.45)
pO2, Arterial: 73 mm[Hg] — ABNORMAL LOW (ref 83–108)

## 2023-09-24 MED ORDER — HEPARIN SODIUM (PORCINE) 1000 UNIT/ML DIALYSIS
2000.0000 [IU] | Freq: Once | INTRAMUSCULAR | Status: DC
  Filled 2023-09-24: qty 2

## 2023-09-24 MED ORDER — NEPRO/CARBSTEADY PO LIQD: 237.0000 mL | ORAL | Status: DC | PRN

## 2023-09-24 MED ORDER — COLCHICINE 0.3 MG HALF TABLET
0.3000 mg | ORAL_TABLET | Freq: Every day | ORAL | Status: DC
  Administered 2023-09-25 – 2023-09-27 (×2): 0.3 mg via ORAL
  Filled 2023-09-24 (×4): qty 1

## 2023-09-24 MED ORDER — ANTICOAGULANT SODIUM CITRATE 4% (200MG/5ML) IV SOLN: 5.0000 mL | Status: DC | PRN

## 2023-09-24 MED ORDER — HEPARIN SODIUM (PORCINE) 1000 UNIT/ML DIALYSIS: 2000.0000 [IU] | INTRAMUSCULAR | Status: DC | PRN

## 2023-09-24 MED ORDER — LIDOCAINE HCL (PF) 1 % IJ SOLN: 5.0000 mL | INTRAMUSCULAR | Status: DC | PRN

## 2023-09-24 MED ORDER — HEPARIN SODIUM (PORCINE) 1000 UNIT/ML DIALYSIS: 1000.0000 [IU] | INTRAMUSCULAR | Status: DC | PRN

## 2023-09-24 MED ORDER — ALBUMIN HUMAN 25 % IV SOLN
25.0000 g | Freq: Once | INTRAVENOUS | Status: AC
  Administered 2023-09-24: 25 g via INTRAVENOUS
  Filled 2023-09-24: qty 100

## 2023-09-24 MED ORDER — PENTAFLUOROPROP-TETRAFLUOROETH EX AERO
1.0000 | INHALATION_SPRAY | CUTANEOUS | Status: DC | PRN
  Filled 2023-09-24: qty 30

## 2023-09-24 MED ORDER — LIDOCAINE-PRILOCAINE 2.5-2.5 % EX CREA: 1.0000 | TOPICAL_CREAM | CUTANEOUS | Status: DC | PRN

## 2023-09-24 NOTE — Progress Notes (Signed)
Came to see patient unresponsive , difficult to awake and gasping for air. On BIPAP. Looks like she is at end of life. Blood sugars are adequate. Pulse and BP is adequate. Called and updated husband on the phone and advised to come to hospital as soon as possible. Confirmed DNR with BIPAP use acceptable.  Can not go to HD like this .  Met patient's husband at the bedside. See details in progress note

## 2023-09-24 NOTE — Plan of Care (Signed)

## 2023-09-24 NOTE — Progress Notes (Signed)
RT called to patient's room to assist RN with placing patient on Bipap. RT gave Breo inhaler first and placed patient on Bipap. Sats 90% on 40%, increased FIO2 to 50%. RN at bedside.

## 2023-09-24 NOTE — Progress Notes (Signed)
Patient wanted to come off bipap. Placed patient on nasal cannula set at 8lpm with Sp02=98%. Talked with nurse and patient to notify respiratory if patient became short of breath and respiratory would place patient back on bipap,

## 2023-09-24 NOTE — Progress Notes (Signed)
Date and time results received: 09/24/23 930 (use smartphrase ".now" to insert current time)  Test: ABG Critical Value: pH 7.07, pCO2 109  Name of Provider Notified: Dorcas Carrow  Orders Received? Or Actions Taken?: Actions Taken: MD and Pt's husband by bedside

## 2023-09-24 NOTE — Consult Note (Signed)
Palliative Medicine Inpatient Consult Note  Consulting Provider:  Dorcas Carrow, MD   Reason for consult:   Palliative Care Consult Services Palliative Medicine Consult  Reason for Consult? goal of care   09/24/2023  HPI:  Per intake H&P --> 72 year old female with ESRD on hemodialysis, COPD, type 1 diabetes on insulin, hypertension, hypothyroidism, hyperlipidemia, sleep apnea not using CPAP currently with recurrent hospitalizations for hypoxemia and respiratory distress, fluid overload presented back to the emergency room with recurrence of worsening respiratory distress at home.  Palliative care has been requested to further address goals of care.   Clinical Assessment/Goals of Care:  *Please note that this is a verbal dictation therefore any spelling or grammatical errors are due to the "Dragon [employer] One" system interpretation.  I have reviewed medical records including EPIC notes, labs and imaging, received report from bedside RN, assessed the patient who is lying in bed in acute distress, tachypneic and altered.   Dr. Jerral Ralph and I met with patients spouse, Mathis Fare to further discuss diagnosis prognosis, GOC, EOL wishes, disposition and options.   I introduced Palliative Medicine as specialized medical care for people living with serious illness. It focuses on providing relief from the symptoms and stress of a serious illness. The goal is to improve quality of life for both the patient and the family.  Medical History Review and Understanding:  A review of patients history was complete inclusive of [name] [name]'s h/o ESRD, COPD, T1DM, HTN, hypothyroidism, HLD, sleep apnea.   Social History:  [name] [name] has been married to her husband, Mathis Fare for > 30 years. She has one son. She is retired. She is a person of   Functional and Nutritional State:  Preceding hospitalization, patient used a wheelchair. She has a robust appetite at baseline.   Advance Directives:  A detailed  discussion was had today regarding advanced directives.  Patients   Code Status:  Concepts specific to code status, artifical feeding and hydration, continued IV antibiotics and rehospitalization was had.  The difference between a aggressive medical intervention path  and a palliative comfort care path for this patient at this time was had.   [name] [name] is an established DNAR/DNI code status.   Discussion:  Dr. Jerral Ralph shares his concern in regard to patients present health during his time at bedside. He reviewed that [name] [name] has declined quite significantly. He shares that this is likely in the setting of her weakened heart. He reviews that patients clinical state is tenuous and it would be prudent to make additional decision regarding care. Patients spouse is overwhelmed with emotion during this time. He shares that he has an appointment to go to though plans to come back later day.   I shared with patients spouse my concern that she is likely near end of life. He, however did not appreciate this and prefers to hear from the medical doctor regarding next steps only.   Dr. Jerral Ralph has shared that he will meet with the patients spouse and family later in the day. He does not feel right now Palliative services are needed.   Decision Maker: [name] [name] (Spouse): [phone]  SUMMARY OF RECOMMENDATIONS   DNAR/DNI  Patient has had an acute clinical decline - presently on Bipap  Dr. Jerral Ralph plans to meet with patients family later on  Patients spouse did not appreciate Palliative input this late morning  We will remain peripheral and are happy to offer any additional support as needed  Code Status/Advance Care Planning: DNAR/DNI   Palliative Prophylaxis:  Aspiration, Bowel Regimen, Delirium Protocol, Frequent Pain Assessment, Oral Care, Palliative Wound Care, and Turn Reposition  Additional Recommendations (Limitations, Scope, Preferences): Continue present  care  Psycho-social/Spiritual:  Desire for further Chaplaincy support: Yes Additional Recommendations: Review of patients clinical decline   Prognosis: Very poor overall, high disease burden, recurring readmissions.   Discharge Planning: Unclear.  Vitals:   09/24/23 0621 09/24/23 0756  BP:  (!) 101/59  Pulse:    Resp:    Temp:  97.6 F (36.4 C)  SpO2: 92%     Intake/Output Summary (Last 24 hours) at 09/24/2023 0827 Last data filed at 09/23/2023 1400 Gross per 24 hour  Intake 350 ml  Output --  Net 350 ml   Last Weight  Most recent update: 09/23/2023  5:31 AM    Weight  97.5 kg (214 lb 15.2 oz)            Gen:  Elderly AA F acutley ill appearing HEENT: Dry mucous membranes CV: Regular rate and rhythm  PULM:  (+) 8LPM HFNC, (+) diffuse crackles ABD: soft/nontender  EXT: (+) edema  Neuro: Somnolent  PPS: 10%   This conversation/these recommendations were discussed with patient primary care team, Dr. Jerral Ralph  Billing based on MDM: High ______________________________________________________ Lamarr Lulas [facility] Palliative Medicine Team Team Cell Phone: [phone] Please utilize secure chat with additional questions, if there is no response within 30 minutes please call the above phone number  Palliative Medicine Team providers are available by phone from 7am to 7pm daily and can be reached through the team cell phone.  Should this patient require assistance outside of these hours, please call the patient's attending physician.

## 2023-09-24 NOTE — Progress Notes (Signed)
Pt found to be pulling her BiPAP off, SpO2 in low 80s. Educated pt to stay on BiPAP to improve her medical condition. Back on BiPAP now. SpO2 98%.

## 2023-09-24 NOTE — Progress Notes (Signed)
[name] Progress Note  Subjective: pt seen in room, on bipap mid morning, sleeping  Vitals:   09/24/23 1358 09/24/23 1554 09/24/23 1612 09/24/23 1631  BP: (!) 103/56 120/69 120/69 (!) 129/107  Pulse: 95 84 88   Resp: 10 14 (!) 21   Temp:    98.9 F (37.2 C)  TempSrc:    Axillary  SpO2: 95% 97% 92%   Weight:      Height:        Exam: Gen sleeping, on bipap, not in distress No jvd or bruits Chest dec'd BS at bases, occ rales RRR no RG Abd soft ntnd no mass or ascites +bs Ext no sig LE or UE edema Neuro is as above     LUA AVG+bruit     Renal-related home meds: - eliquis 5 bid - metoprolol 37.5mg  bid - renvela 2 ac tid - O2 3L [place]      OP HD: [facility]  4h  400/600  96.5kg  2/2 bath  LUA AVG  Heparin 2000+ 2000 midrun       Assessment/ Plan: Acute on chronic hypoxic and hypercarbic resp failure - pt here 12/06 w/ similar presentation, also happened in November and October. Pt got HD in ED Sunday night w/ 2.4 L removed using IV alb for BP support. If stable enough will try HD again today/ tonight.    Hypotension - cont midodrine at 15 tid. IV albumin prn.  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 Had HD here yesterday. Next HD today.  HTN - takes metoprolol bid (only bp lower med), hold for now Volume - not edematous on exam, only 1kg up by wts.  Anemia of eskd - Hb 9-11, follow.  MBD ckd - CCa  in range, add on phos.  pHTN/ chronic syst CHF - both are worsening per recent cardiology assessment on last admission DNR - limited, meaning DNI       [name] MD  [facility] 09/24/2023, 4:40 PM  Recent Labs  Lab 09/22/23 1056 09/22/23 1128 09/22/23 2017 09/22/23 2251 09/23/23 0422  HGB 9.3*   < > 11.9* 11.9*  --   ALBUMIN 3.4*  --   --   --   --   CALCIUM 9.7  --   --   --  9.7  CREATININE 4.63*  --   --   --  3.38*  K 4.0   < > 3.8 5.4* 3.6   < > = values in this interval not displayed.   No results for input(s): "IRON", "TIBC", "FERRITIN" in the last 168  hours. Inpatient medications:  apixaban  5 mg Oral BID   Chlorhexidine Gluconate Cloth  6 each Topical Q0600   clopidogrel  75 mg Oral Daily   colchicine  0.3 mg Oral Daily   fluticasone furoate-vilanterol  1 puff Inhalation Daily   [START ON 09/25/2023] heparin  2,000 Units Dialysis Once in dialysis   insulin aspart  0-5 Units Subcutaneous QHS   insulin aspart  0-6 Units Subcutaneous TID WC   levothyroxine  175 mcg Oral Q0600   midodrine  15 mg Oral Q8H    anticoagulant sodium citrate     anticoagulant sodium citrate     acetaminophen **OR** acetaminophen, alteplase, anticoagulant sodium citrate, anticoagulant sodium citrate, feeding supplement (NEPRO CARB STEADY), feeding supplement (NEPRO CARB STEADY), heparin, heparin, heparin, [START ON 09/25/2023] heparin, lidocaine (PF), lidocaine (PF), lidocaine-prilocaine, lidocaine-prilocaine, ondansetron **OR** ondansetron (ZOFRAN) IV, pentafluoroprop-tetrafluoroeth, pentafluoroprop-tetrafluoroeth

## 2023-09-24 NOTE — Progress Notes (Signed)
PROGRESS NOTE    [name] [name]  YNW:[id number] DOB: 1951-07-29 DOA: 09/22/2023 PCP: Vladimir Crofts, FNP    Brief Narrative:  72 year old female with ESRD on hemodialysis, COPD, type 2 diabetes on insulin, hypertension, hypothyroidism, hyperlipidemia, sleep apnea not using CPAP currently with recurrent hospitalizations for hypoxemia and respiratory distress, fluid overload presented back to the emergency room with recurrence of worsening respiratory distress at home.  Apparently, she was not wearing oxygen/ BIPAP at home.  When she was brought to the emergency room she was hemodynamically stable.  VBG with pH of 7.09, CO2 77.  Was with increased work of breathing , started on BiPAP and admitted to the hospital.  Also received hemodialysis. Multiple episodes of CO2 retention and remains critically ill. 12/17, obtunded in the morning rounds with gasping breath.  pH 7.07.   Subjective: Patient seen and examined.  Declined BiPAP most of the time at night and was restless.  Early morning, she was obtunded with shallow breathing. Arterial blood gas shows pH of 7.07 with CO2 more than 100.  On BiPAP. See family discussion below.  Goal of care: On my exam in the morning rounds, patient was obtunded and unresponsive.  Placed on BiPAP and husband was called and notified about her critical situation. Husband arrived at the bedside, detailed discussion about very poor prognosis, her heart and lung not working and currently remains critical with almost at end-of-life if not responding to BiPAP and he understands.  Currently desires to continue BiPAP and have more family come together. Explained to the patient's husband that she may die anytime. Patient is DNR/DNI. Will continue all supportive care.  Will recommend comfort care if family agrees otherwise she may die under medical care. Unfortunately, he declined to talk to palliative care team.  Assessment & Plan:   Acute on chronic hypoxemic and  hypercapnic respiratory failure, recurrent.  Severe hypercapnic failure. Multifactorial. ESRD on hemodialysis with fluid overload Obesity hypoventilation syndrome, ineffective use of BiPAP at home Chronic systolic heart failure with known ejection fraction 20 to 25% Severe pulmonary hypertension. Recurrent hypercarbia  Keep on oxygen to keep saturation more than 90%.  Keep on BiPAP as needed for work of breathing and hypercapnia.  She will need to stay on BiPAP most of the time. Continue supportive care. Received hemodialysis 12/15 for fluid removal.  Not stable enough to go for dialysis today. Repeat chest x-ray was ordered, left-sided pleural effusion.  Poorly ventilated lungs.  Chronic medical issues including Anemia of chronic disease, hemoglobin is stable Coronary artery disease, currently stable. Type 2 diabetes, on insulin.  Reduced dose of insulin with risk of hypoglycemia.  Not taking anything by mouth. Paroxysmal A-fib, currently sinus rhythm.  On metoprolol and therapeutic on Eliquis.  Unable to take medications today. Hypothyroidism, on Synthroid.     DVT prophylaxis: SCDs Start: 09/22/23 1327 apixaban (ELIQUIS) tablet 5 mg   Code Status: DNR with limited intervention Family Communication: Husband at the bedside. Disposition Plan: Status is: Inpatient Remains inpatient appropriate because: Severe decompensated respiratory failure, continuous BiPAP     Consultants:  Nephrology Palliative  Procedures:  None  Antimicrobials:  None     Objective: Vitals:   09/24/23 1031 09/24/23 1154 09/24/23 1159 09/24/23 1238  BP:  (!) 82/73 (!) 79/43 97/67  Pulse: 89   88  Resp: 15   17  Temp:  (!) 97.3 F (36.3 C)    TempSrc:  Oral    SpO2: 94%   100%  Weight:  Height:        Intake/Output Summary (Last 24 hours) at 09/24/2023 1241 Last data filed at 09/24/2023 0900 Gross per 24 hour  Intake 225 ml  Output --  Net 225 ml   Filed Weights   09/22/23  2316  Weight: 97.5 kg    Examination:  General exam: Obtunded.  Unresponsive. Respiratory system: No added sounds.  Poor air entry.  Poor inspiratory efforts.  On BiPAP. Cardiovascular system: S1 & S2 heard, RRR. Gastrointestinal system: Soft.  Distended.  Nontender. Central nervous system: Unresponsive.     Data Reviewed: I have personally reviewed following labs and imaging studies  CBC: Recent Labs  Lab 09/22/23 1056 09/22/23 1128 09/22/23 1313 09/22/23 2017 09/22/23 2251  WBC 8.8  --   --   --   --   NEUTROABS 7.0  --   --   --   --   HGB 9.3* 11.2* 7.1* 11.9* 11.9*  HCT 34.1* 33.0* 21.0* 35.0* 35.0*  MCV 111.8*  --   --   --   --   PLT 292  --   --   --   --    Basic Metabolic Panel: Recent Labs  Lab 09/22/23 1056 09/22/23 1128 09/22/23 1313 09/22/23 2017 09/22/23 2251 09/23/23 0422  NA 137 138 147* 133* 128* 135  K 4.0 4.0 2.5* 3.8 5.4* 3.6  CL 96*  --   --   --   --  91*  CO2 29  --   --   --   --  31  GLUCOSE 265*  --   --   --   --  275*  BUN 27*  --   --   --   --  20  CREATININE 4.63*  --   --   --   --  3.38*  CALCIUM 9.7  --   --   --   --  9.7   GFR: Estimated Creatinine Clearance: 16.7 mL/min (A) (by C-G formula based on SCr of 3.38 mg/dL (H)). Liver Function Tests: Recent Labs  Lab 09/22/23 1056  AST 20  ALT 12  ALKPHOS 68  BILITOT 0.5  PROT 7.0  ALBUMIN 3.4*   No results for input(s): "LIPASE", "AMYLASE" in the last 168 hours. No results for input(s): "AMMONIA" in the last 168 hours. Coagulation Profile: No results for input(s): "INR", "PROTIME" in the last 168 hours. Cardiac Enzymes: No results for input(s): "CKTOTAL", "CKMB", "CKMBINDEX", "TROPONINI" in the last 168 hours. BNP (last 3 results) No results for input(s): "PROBNP" in the last 8760 hours. HbA1C: No results for input(s): "HGBA1C" in the last 72 hours. CBG: Recent Labs  Lab 09/23/23 1154 09/23/23 1637 09/23/23 2210 09/24/23 0757 09/24/23 1223  GLUCAP 185*  186* 222* 244* 231*   Lipid Profile: No results for input(s): "CHOL", "HDL", "LDLCALC", "TRIG", "CHOLHDL", "LDLDIRECT" in the last 72 hours. Thyroid Function Tests: No results for input(s): "TSH", "T4TOTAL", "FREET4", "T3FREE", "THYROIDAB" in the last 72 hours. Anemia Panel: No results for input(s): "VITAMINB12", "FOLATE", "FERRITIN", "TIBC", "IRON", "RETICCTPCT" in the last 72 hours. Sepsis Labs: No results for input(s): "PROCALCITON", "LATICACIDVEN" in the last 168 hours.  Recent Results (from the past 240 hours)  MRSA Next Gen by PCR, Nasal     Status: None   Collection Time: 09/23/23  2:08 AM   Specimen: Nasal Mucosa; Nasal Swab  Result Value Ref Range Status   MRSA by PCR Next Gen NOT DETECTED NOT DETECTED Final  Comment: (NOTE) The GeneXpert MRSA Assay (FDA approved for NASAL specimens only), is one component of a comprehensive MRSA colonization surveillance program. It is not intended to diagnose MRSA infection nor to guide or monitor treatment for MRSA infections. Test performance is not FDA approved in patients less than 76 years old. Performed at [facility], [address] [place], [place] [address]          Radiology Studies: DG CHEST PORT 1 VIEW Result Date: 09/23/2023 CLINICAL DATA:  Pneumonia. EXAM: PORTABLE CHEST 1 VIEW COMPARISON:  09/22/2023 FINDINGS: The cardio pericardial silhouette is enlarged. Similar left base collapse/consolidation with effusion. Vascular congestion again noted. Bones are diffusely demineralized. Telemetry leads overlie the chest. IMPRESSION: 1. Similar left base collapse/consolidation with effusion. 2. Vascular congestion. Electronically Signed   By: Kennith Center M.D.   On: 09/23/2023 16:21        Scheduled Meds:  apixaban  5 mg Oral BID   Chlorhexidine Gluconate Cloth  6 each Topical Q0600   clopidogrel  75 mg Oral Daily   colchicine  0.3 mg Oral Daily   fluticasone furoate-vilanterol  1 puff Inhalation Daily    [START ON 09/25/2023] heparin  2,000 Units Dialysis Once in dialysis   insulin aspart  0-5 Units Subcutaneous QHS   insulin aspart  0-6 Units Subcutaneous TID WC   levothyroxine  175 mcg Oral Q0600   midodrine  15 mg Oral Q8H   Continuous Infusions:  anticoagulant sodium citrate     anticoagulant sodium citrate       LOS: 2 days    Time spent: 35 minutes    Dorcas Carrow, MD [facility]

## 2023-09-24 NOTE — Progress Notes (Signed)
Helping floor RT- came to room to check bipap, RN is at bedside.  Initially pt was sleeping, but was arousable and speaking w/ stimulation from RN. No distress currently noted, VSS.  Per RN, pt has not been having N/V.

## 2023-09-24 NOTE — Progress Notes (Signed)
Pt tranported via Bipap ventilator to dialysis w/o complications. RT and TPN @ bedside.

## 2023-09-24 NOTE — Progress Notes (Addendum)
Assessed patient and disc with family, floor RN, and dialysis charge RN regarding if she is appropriate for HD today. This AM (9am) was minimally responsive, hypoxic, gasping for breath, woith cool extremities, and extremely acidotic. BiPAP placed. Now seen in room, niece is visiting. She is awake and responsive, smiling and answering questions. SBP 90s. Better but not perfect. Will have RN give her scheduled mido 15mg  and also 25g IV albumin and see if can get her BP up. If improves, can try HD today. If not, then will rec comfort care measures.  Ozzie Hoyle, PA-C [employer] [employer] Pager [phone]

## 2023-09-25 ENCOUNTER — Inpatient Hospital Stay: Payer: No Typology Code available for payment source | Admitting: Physician Assistant

## 2023-09-25 DIAGNOSIS — J9622 Acute and chronic respiratory failure with hypercapnia: Secondary | ICD-10-CM | POA: Diagnosis not present

## 2023-09-25 DIAGNOSIS — J9621 Acute and chronic respiratory failure with hypoxia: Secondary | ICD-10-CM | POA: Diagnosis not present

## 2023-09-25 LAB — GLUCOSE, CAPILLARY
Glucose-Capillary: 128 mg/dL — ABNORMAL HIGH (ref 70–99)
Glucose-Capillary: 146 mg/dL — ABNORMAL HIGH (ref 70–99)
Glucose-Capillary: 276 mg/dL — ABNORMAL HIGH (ref 70–99)
Glucose-Capillary: 265 mg/dL — ABNORMAL HIGH (ref 70–99)
Glucose-Capillary: 285 mg/dL — ABNORMAL HIGH (ref 70–99)

## 2023-09-25 LAB — HEPATITIS B SURFACE ANTIGEN: Hepatitis B Surface Ag: NONREACTIVE

## 2023-09-25 MED ORDER — POLYETHYLENE GLYCOL 3350 17 G PO PACK
17.0000 g | PACK | Freq: Every day | ORAL | Status: DC
  Administered 2023-09-25 – 2023-09-27 (×2): 17 g via ORAL
  Filled 2023-09-25 (×3): qty 1

## 2023-09-25 NOTE — Progress Notes (Signed)
   09/25/23 2014  BiPAP/CPAP/SIPAP  BiPAP/CPAP/SIPAP Pt Type Adult  BiPAP/CPAP/SIPAP SERVO  Mask Type Full face mask  Mask Size Medium  Set Rate 14 breaths/min  Respiratory Rate 28 breaths/min  IPAP 14 cmH20  EPAP 5 cmH2O  PEEP 5 cmH20  FiO2 (%) 50 %  Minute Ventilation 12.8  Leak 21  Peak Inspiratory Pressure (PIP) 14  Tidal Volume (Vt) 418  Patient Home Equipment No  Auto Titrate No  Press High Alarm 25 cmH2O

## 2023-09-25 NOTE — Progress Notes (Signed)
Received patient in bed to unit.  Alert and oriented.  Informed consent signed and in chart.   TX duration: 3:15  Patient tolerated well.  Transported back to the room  Alert, without acute distress.  Hand-off given to patient's nurse.   Access used: LUE AVF Access issues: Arterial site cannulated X 2 attempts; first cannulation attempt unsuccessful d/t inability to advance needle.  Total UF removed: 2800 mL Medication(s) given: Heparin 2000 units Post HD VS: please see data insert    09/25/23 0308  Vitals  Temp 98.1 F (36.7 C)  Temp Source Oral  BP (!) 158/61  MAP (mmHg) 80  BP Location Right Arm  BP Method Automatic  Patient Position (if appropriate) Lying  Pulse Rate (!) 106  Pulse Rate Source Monitor  ECG Heart Rate (!) 107  Resp 15  Oxygen Therapy  SpO2 99 %  O2 Device HFNC  O2 Flow Rate (L/min) 8 L/min  Patient Activity (if Appropriate) In bed  Pulse Oximetry Type Continuous  Post Treatment  Dialyzer Clearance Lightly streaked  Hemodialysis Intake (mL) 0 mL  Liters Processed 78  Fluid Removed (mL) 2800 mL  Tolerated HD Treatment Yes  Post-Hemodialysis Comments Treatment completed and blood retuned without issue.  AVG/AVF Arterial Site Held (minutes) 12 minutes (Gauze applied and secured with paper tape; hemostasis achieved.)  AVG/AVF Venous Site Held (minutes) 12 minutes (Gauze applied and secured with paper tape; hemostasis achieved.)  Note  Patient Observations Patient alert, no acute distress noted; patient stable for return transport.  Fistula / Graft Left Upper arm Arteriovenous vein graft  Placement Date/Time: 12/01/21 1444   Placed prior to admission: No  Orientation: Left  Access Location: Upper arm  Access Type: Arteriovenous vein graft  Site Condition No complications  Fistula / Graft Assessment Present;Thrill;Bruit  Status Flushed;Patent;Deaccessed  Drainage Description None      [facility]

## 2023-09-25 NOTE — Progress Notes (Signed)
   Palliative Medicine Inpatient Follow Up Note   The Palliative medicine team remains peripherally involved.   Have asked Dr. Jerral Ralph if we should continue to follow along. He shares that he will speak to patients husband and update the team from there.  No Charge ______________________________________________________________________________________ [name] [name] Palliative Medicine Team Team Cell Phone: [phone] Please utilize secure chat with additional questions, if there is no response within 30 minutes please call the above phone number  Palliative Medicine Team providers are available by phone from 7am to 7pm daily and can be reached through the team cell phone.  Should this patient require assistance outside of these hours, please call the patient's attending physician.

## 2023-09-25 NOTE — Care Management Important Message (Signed)
Important Message  Patient Details  Name: [name] [name] MRN: [id number] Date of Birth: May 17, 1951   Important Message Given:  Yes - Medicare IM     Renie Ora 09/25/2023, 8:18 AM

## 2023-09-25 NOTE — Progress Notes (Signed)
   09/25/23 1745  Vitals  Temp (!) 97.4 F (36.3 C)  Pulse Rate (!) 109  Resp (!) 23  BP (!) 116/52  SpO2 100 %  Weight 87.4 kg  Post Treatment  Dialyzer Clearance Lightly streaked  Hemodialysis Intake (mL) 0 mL  Liters Processed 72  Fluid Removed (mL) 3000 mL  Tolerated HD Treatment Yes  AVG/AVF Arterial Site Held (minutes) 10 minutes  AVG/AVF Venous Site Held (minutes) 10 minutes   Received patient in bed to unit.  Alert and oriented.  Informed consent signed and in chart.   TX duration:3hrs   Patient tolerated well.  Transported back to the room  Alert, without acute distress.  Hand-off given to patient's nurse.   Access used: LAVF Access issues: none  Total UF removed: 3L Medication(s) given: none   [facility] T [facility]

## 2023-09-25 NOTE — Plan of Care (Signed)

## 2023-09-25 NOTE — Progress Notes (Signed)
[facility] Progress Note   Subjective:   Seen in room - dialyzed last night with 2.8L removed, able to come off BiPAP. Seen in room this AM - on Adin O2, but labored breathing/gasping. D/w RN, ^ O2 rate. Will plan for another HD today as soon as possible. BP much higher today. Reviewing Epic chart, was supposed to be having PET scan this week to evaluate for recurrence of her LLL lung cancer - see oncology note 08/06/23.   Objective Vitals:   09/25/23 0308 09/25/23 0337 09/25/23 0347 09/25/23 0810  BP: (!) 158/61 (!) 142/60 (!) 155/70   Pulse: (!) 106 100 (!) 101   Resp: 15 17 18    Temp: 98.1 F (36.7 C) 98.1 F (36.7 C) 98.2 F (36.8 C)   TempSrc: Oral Oral Oral   SpO2: 99% 100% 100% 96%  Weight:      Height:       Physical Exam General: Chronically ill appearing, on nasal O2, labored breathing, O2 rate ^ with RN Heart: Tachycardic Lungs: CTA anteriorly, labored breathing Abdomen: soft Extremities: no LE edema Dialysis Access: LUE AVG + t/b  Additional Objective Labs: Basic Metabolic Panel: Recent Labs  Lab 09/22/23 1056 09/22/23 1128 09/22/23 2251 09/23/23 0422 09/24/23 2243  NA 137   < > 128* 135 132*  K 4.0   < > 5.4* 3.6 3.9  CL 96*  --   --  91* 92*  CO2 29  --   --  31 29  GLUCOSE 265*  --   --  275* 161*  BUN 27*  --   --  20 49*  CREATININE 4.63*  --   --  3.38* 5.58*  CALCIUM 9.7  --   --  9.7 9.8  PHOS  --   --   --   --  5.6*   < > = values in this interval not displayed.   Liver Function Tests: Recent Labs  Lab 09/22/23 1056 09/24/23 2243  AST 20  --   ALT 12  --   ALKPHOS 68  --   BILITOT 0.5  --   PROT 7.0  --   ALBUMIN 3.4* 3.4*   CBC: Recent Labs  Lab 09/22/23 1056 09/22/23 1128 09/22/23 1313 09/22/23 2017 09/22/23 2251  WBC 8.8  --   --   --   --   NEUTROABS 7.0  --   --   --   --   HGB 9.3*   < > 7.1* 11.9* 11.9*  HCT 34.1*   < > 21.0* 35.0* 35.0*  MCV 111.8*  --   --   --   --   PLT 292  --   --   --   --     < > = values in this interval not displayed.   Studies/Results: DG CHEST PORT 1 VIEW Result Date: 09/23/2023 CLINICAL DATA:  Pneumonia. EXAM: PORTABLE CHEST 1 VIEW COMPARISON:  09/22/2023 FINDINGS: The cardio pericardial silhouette is enlarged. Similar left base collapse/consolidation with effusion. Vascular congestion again noted. Bones are diffusely demineralized. Telemetry leads overlie the chest. IMPRESSION: 1. Similar left base collapse/consolidation with effusion. 2. Vascular congestion. Electronically Signed   By: Kennith Center M.D.   On: 09/23/2023 16:21   Medications:   apixaban  5 mg Oral BID   Chlorhexidine Gluconate Cloth  6 each Topical Q0600   clopidogrel  75 mg Oral Daily   colchicine  0.3 mg Oral Daily   fluticasone furoate-vilanterol  1 puff Inhalation Daily   insulin aspart  0-5 Units Subcutaneous QHS   insulin aspart  0-6 Units Subcutaneous TID WC   levothyroxine  175 mcg Oral Q0600   midodrine  15 mg Oral Q8H   polyethylene glycol  17 g Oral Daily   Dialysis Orders: TTS - [facility] 4hr, 400/600, EDW 96.5kg, 2K/2Ca bath, LUE AVG, hepariin 2000 initial + 2000 unit mid-run bolus  Assessment/Plan: Acute on chronic hypoxic respiratory failure: Recurrent admits for overload/dyspnea requiring BiPAP which improves with serial HD. Able to be dialyzed last night - 2.8L removed, still dyspneic today - will try again for HD today - at least we are not fighting hypotension today.  Hypotension: Continue midodrine 15mg  TID and can use Iv albumin prn.  Hx LLL lung cancer s/p "curative" radiation, now with concern for recurrence per last oncology note - is supposed to have a PET scan this week. Consider weighing in with her oncologist, can the PET be done while inpatient? If cancer is back, then this would be helpful information for GOC discussions. ESRD: Usual TTS schedule - see above. S/p HD yesterday, for extra UF only HD today. HTN: Prev on metoprolol - on hold, no LE edema but CXR  with persistent pulm edema Anemia of ESRD: Hgb 11.9, no ESA for now. Secondary HPTH: CorrCa/Phos both a little high, no VDRA for now. Restart binder if Phos rises further. HFrEF (EF 20-25% in 09/2023) T1DM A-fib: On Eliquis   [name] [name], Cordelia Poche 09/25/2023, 9:29 AM  [facility]

## 2023-09-25 NOTE — Progress Notes (Signed)
PROGRESS NOTE    [name] [name]  ZHY:[id number] DOB: 03-Mar-1951 DOA: 09/22/2023 PCP: Vladimir Crofts, FNP    Brief Narrative:  72 year old female with ESRD on hemodialysis, COPD, type 2 diabetes on insulin, hypertension, hypothyroidism, hyperlipidemia, sleep apnea not using CPAP currently with recurrent hospitalizations for hypoxemia and respiratory distress, fluid overload presented back to the emergency room with recurrence of worsening respiratory distress at home.  Apparently, she was not wearing oxygen/ BIPAP at home.  When she was brought to the emergency room she was hemodynamically stable.  VBG with pH of 7.09, CO2 77.  Was with increased work of breathing , started on BiPAP and admitted to the hospital.  Also received hemodialysis. Multiple episodes of CO2 retention and remains critically ill. 12/17, obtunded in the morning rounds with gasping breath.  pH 7.07.  CO2 more than 100. Remained all day and night on BiPAP, woke up.  Received hemodialysis overnight. 12/18, more awake but still drowsy.  Planning for another dialysis today.   Subjective:  Patient seen and examined.  In the morning rounds her niece was at the bedside and patient was trying to eat some of the [place] toast and bacon.  Patient told me she feels fine.  She was confused about me insisting her putting on BiPAP all the time. Patient is however agreeable. Multiple episodes of lethargy, sometimes she looks like she is gasping for air and responds to BiPAP. She is not ready to discuss/counsel much about her disease process.  Assessment & Plan:   Acute on chronic hypoxemic and hypercapnic respiratory failure, recurrent.  Severe hypercapnic failure. Multifactorial. ESRD on hemodialysis with fluid overload Obesity hypoventilation syndrome, ineffective use of BiPAP at home Chronic systolic heart failure with known ejection fraction 20 to 25% Severe pulmonary hypertension. Recurrent hypercarbia  Keep on oxygen to  keep saturation more than 90%.  Keep on BiPAP as needed for work of breathing and hypercapnia.  She will need to stay on BiPAP most of the time. Continue supportive care. Received hemodialysis last night.  Another dialysis planned for today.   Very poor prognosis.  Chronic medical issues including Anemia of chronic disease, hemoglobin is stable Coronary artery disease, currently stable. Type 2 diabetes, on insulin.  Reduced dose of insulin with risk of hypoglycemia.  She does not have much oral intake. Paroxysmal A-fib, currently sinus rhythm.  On metoprolol and therapeutic on Eliquis.   Hypothyroidism, on Synthroid. Suspected lung cancer: She does have a prevascular mediastinal lesion that is increasing in size.  She has been seen by oncology and due for PET scan.  She would likely unable to make outpatient appointments because of her critical illness.  Goal of care: Multiple goal of care discussions at bedside.  Patient with fluctuating clinical status, occasional critical situation including gasping and hypercarbia. Patient's husband is Education officer, environmental.  Currently wants to continue dialysis, family will urge her to keep on BiPAP.   DVT prophylaxis: SCDs Start: 09/22/23 1327 apixaban (ELIQUIS) tablet 5 mg   Code Status: DNR with limited intervention Family Communication: Husband on the phone  Disposition Plan: Status is: Inpatient Remains inpatient appropriate because: Severe decompensated respiratory failure, continuous BiPAP     Consultants:  Nephrology Palliative  Procedures:  None  Antimicrobials:  None     Objective: Vitals:   09/25/23 0347 09/25/23 0810 09/25/23 0930 09/25/23 1103  BP: (!) 155/70     Pulse: (!) 101     Resp: 18     Temp: 98.2 F (36.8  C)     TempSrc: Oral     SpO2: 100% 96% 99% 96%  Weight:      Height:        Intake/Output Summary (Last 24 hours) at 09/25/2023 1245 Last data filed at 09/25/2023 0818 Gross per 24 hour  Intake 240 ml   Output 2800 ml  Net -2560 ml   Filed Weights   09/22/23 2316  Weight: 97.5 kg    Examination:  General exam: Alert awake.  Sick looking.  Very frail and debilitated.  Unable to complete sentences. Respiratory system: No added sounds.  Poor air entry.  Poor inspiratory efforts.  Early morning she was on 8 L of oxygen. Cardiovascular system: S1 & S2 heard, RRR. Gastrointestinal system: Soft.  Distended.  Nontender. Central nervous system: Generalized weakness.     Data Reviewed: I have personally reviewed following labs and imaging studies  CBC: Recent Labs  Lab 09/22/23 1056 09/22/23 1128 09/22/23 1313 09/22/23 2017 09/22/23 2251  WBC 8.8  --   --   --   --   NEUTROABS 7.0  --   --   --   --   HGB 9.3* 11.2* 7.1* 11.9* 11.9*  HCT 34.1* 33.0* 21.0* 35.0* 35.0*  MCV 111.8*  --   --   --   --   PLT 292  --   --   --   --    Basic Metabolic Panel: Recent Labs  Lab 09/22/23 1056 09/22/23 1128 09/22/23 1313 09/22/23 2017 09/22/23 2251 09/23/23 0422 09/24/23 2243  NA 137   < > 147* 133* 128* 135 132*  K 4.0   < > 2.5* 3.8 5.4* 3.6 3.9  CL 96*  --   --   --   --  91* 92*  CO2 29  --   --   --   --  31 29  GLUCOSE 265*  --   --   --   --  275* 161*  BUN 27*  --   --   --   --  20 49*  CREATININE 4.63*  --   --   --   --  3.38* 5.58*  CALCIUM 9.7  --   --   --   --  9.7 9.8  PHOS  --   --   --   --   --   --  5.6*   < > = values in this interval not displayed.   GFR: Estimated Creatinine Clearance: 10.1 mL/min (A) (by C-G formula based on SCr of 5.58 mg/dL (H)). Liver Function Tests: Recent Labs  Lab 09/22/23 1056 09/24/23 2243  AST 20  --   ALT 12  --   ALKPHOS 68  --   BILITOT 0.5  --   PROT 7.0  --   ALBUMIN 3.4* 3.4*   No results for input(s): "LIPASE", "AMYLASE" in the last 168 hours. No results for input(s): "AMMONIA" in the last 168 hours. Coagulation Profile: No results for input(s): "INR", "PROTIME" in the last 168 hours. Cardiac Enzymes: No  results for input(s): "CKTOTAL", "CKMB", "CKMBINDEX", "TROPONINI" in the last 168 hours. BNP (last 3 results) No results for input(s): "PROBNP" in the last 8760 hours. HbA1C: No results for input(s): "HGBA1C" in the last 72 hours. CBG: Recent Labs  Lab 09/24/23 1632 09/24/23 2110 09/25/23 0134 09/25/23 0749 09/25/23 1214  GLUCAP 190* 169* 128* 146* 276*   Lipid Profile: No results for input(s): "CHOL", "HDL", "LDLCALC", "TRIG", "CHOLHDL", "  LDLDIRECT" in the last 72 hours. Thyroid Function Tests: No results for input(s): "TSH", "T4TOTAL", "FREET4", "T3FREE", "THYROIDAB" in the last 72 hours. Anemia Panel: No results for input(s): "VITAMINB12", "FOLATE", "FERRITIN", "TIBC", "IRON", "RETICCTPCT" in the last 72 hours. Sepsis Labs: No results for input(s): "PROCALCITON", "LATICACIDVEN" in the last 168 hours.  Recent Results (from the past 240 hours)  MRSA Next Gen by PCR, Nasal     Status: None   Collection Time: 09/23/23  2:08 AM   Specimen: Nasal Mucosa; Nasal Swab  Result Value Ref Range Status   MRSA by PCR Next Gen NOT DETECTED NOT DETECTED Final    Comment: (NOTE) The GeneXpert MRSA Assay (FDA approved for NASAL specimens only), is one component of a comprehensive MRSA colonization surveillance program. It is not intended to diagnose MRSA infection nor to guide or monitor treatment for MRSA infections. Test performance is not FDA approved in patients less than 65 years old. Performed at [facility], [address], [place], [place] [address]          Radiology Studies: No results found.       Scheduled Meds:  apixaban  5 mg Oral BID   Chlorhexidine Gluconate Cloth  6 each Topical Q0600   clopidogrel  75 mg Oral Daily   colchicine  0.3 mg Oral Daily   fluticasone furoate-vilanterol  1 puff Inhalation Daily   insulin aspart  0-5 Units Subcutaneous QHS   insulin aspart  0-6 Units Subcutaneous TID WC   levothyroxine  175 mcg Oral Q0600   midodrine  15  mg Oral Q8H   polyethylene glycol  17 g Oral Daily   Continuous Infusions:     LOS: 3 days    Time spent: 35 minutes    Dorcas Carrow, MD [facility]

## 2023-09-25 NOTE — Progress Notes (Signed)
Pt returned from HD tx. 

## 2023-09-26 ENCOUNTER — Inpatient Hospital Stay (HOSPITAL_COMMUNITY): Payer: 59

## 2023-09-26 DIAGNOSIS — E8770 Fluid overload, unspecified: Secondary | ICD-10-CM | POA: Diagnosis not present

## 2023-09-26 LAB — BLOOD GAS, VENOUS
Acid-base deficit: 2.6 mmol/L — ABNORMAL HIGH (ref 0.0–2.0)
Acid-base deficit: 1.3 mmol/L (ref 0.0–2.0)
Bicarbonate: 29.5 mmol/L — ABNORMAL HIGH (ref 20.0–28.0)
Bicarbonate: 30 mmol/L — ABNORMAL HIGH (ref 20.0–28.0)
Drawn by: 1405
Drawn by: 14070
O2 Saturation: 91.1 %
O2 Saturation: 55.6 %
Patient temperature: 36.7
Patient temperature: 36.8
pCO2, Ven: 92 mm[Hg] (ref 44–60)
pCO2, Ven: 85 mm[Hg] (ref 44–60)
pH, Ven: 7.11 — CL (ref 7.25–7.43)
pH, Ven: 7.15 — CL (ref 7.25–7.43)
pO2, Ven: 69 mm[Hg] — ABNORMAL HIGH (ref 32–45)
pO2, Ven: 35 mm[Hg] (ref 32–45)

## 2023-09-26 LAB — COMPREHENSIVE METABOLIC PANEL
ALT: 31 U/L (ref 0–44)
AST: 33 U/L (ref 15–41)
Albumin: 3.6 g/dL (ref 3.5–5.0)
Alkaline Phosphatase: 85 U/L (ref 38–126)
Anion gap: 18 — ABNORMAL HIGH (ref 5–15)
BUN: 52 mg/dL — ABNORMAL HIGH (ref 8–23)
CO2: 22 mmol/L (ref 22–32)
Calcium: 10.8 mg/dL — ABNORMAL HIGH (ref 8.9–10.3)
Chloride: 90 mmol/L — ABNORMAL LOW (ref 98–111)
Creatinine, Ser: 5.08 mg/dL — ABNORMAL HIGH (ref 0.44–1.00)
GFR, Estimated: 9 mL/min — ABNORMAL LOW (ref >60)
Glucose, Bld: 257 mg/dL — ABNORMAL HIGH (ref 70–99)
Potassium: 4.9 mmol/L (ref 3.5–5.1)
Sodium: 130 mmol/L — ABNORMAL LOW (ref 135–145)
Total Bilirubin: 0.6 mg/dL (ref <1.2)
Total Protein: 7.7 g/dL (ref 6.5–8.1)

## 2023-09-26 LAB — CBC
HCT: 32.2 % — ABNORMAL LOW (ref 36.0–46.0)
Hemoglobin: 9.3 g/dL — ABNORMAL LOW (ref 12.0–15.0)
MCH: 30.5 pg (ref 26.0–34.0)
MCHC: 28.9 g/dL — ABNORMAL LOW (ref 30.0–36.0)
MCV: 105.6 fL — ABNORMAL HIGH (ref 80.0–100.0)
Platelets: 362 10*3/uL (ref 150–400)
RBC: 3.05 MIL/uL — ABNORMAL LOW (ref 3.87–5.11)
RDW: 16.5 % — ABNORMAL HIGH (ref 11.5–15.5)
WBC: 14.7 10*3/uL — ABNORMAL HIGH (ref 4.0–10.5)
nRBC: 5.9 % — ABNORMAL HIGH (ref 0.0–0.2)

## 2023-09-26 LAB — GLUCOSE, CAPILLARY
Glucose-Capillary: 225 mg/dL — ABNORMAL HIGH (ref 70–99)
Glucose-Capillary: 255 mg/dL — ABNORMAL HIGH (ref 70–99)
Glucose-Capillary: 188 mg/dL — ABNORMAL HIGH (ref 70–99)

## 2023-09-26 LAB — HEPATITIS B SURFACE ANTIBODY, QUANTITATIVE: Hep B S AB Quant (Post): <3.5 m[IU]/mL — ABNORMAL LOW

## 2023-09-26 MED ORDER — ALBUMIN HUMAN 25 % IV SOLN
25.0000 g | Freq: Once | INTRAVENOUS | Status: AC
  Administered 2023-09-26: 25 g via INTRAVENOUS
  Filled 2023-09-26: qty 100

## 2023-09-26 NOTE — Progress Notes (Signed)
CRITICAL VALUE STICKER  CRITICAL VALUE: PH=7.15 PCO2=85  RECEIVER (on-site recipient of call):Azzam Mehra RN  DATE & TIME NOTIFIED: 09/26/2023 2220  MESSENGER (representative from lab):Satrainr  MD NOTIFIED: Dr. Margo Aye  TIME OF NOTIFICATION:2240  RESPONSE: No new orders at this time.  Patient has been placed back on BiPAP by RT.

## 2023-09-26 NOTE — Progress Notes (Signed)
Spoke with MD. Catalina Pizza to hold meds. Place correction in [facility]. Meds held except Insulin for meal coverage for elevated CBG of 225.

## 2023-09-26 NOTE — Progress Notes (Addendum)
PROGRESS NOTE    AZION JALBERT  ZOX:[id number] DOB: 02/25/1951 DOA: 09/22/2023 PCP: Vladimir Crofts, FNP  Chief Complaint  Patient presents with   Respiratory Distress    Brief Narrative:   72 year old female with ESRD on hemodialysis, COPD, type 2 diabetes on insulin, hypertension, hypothyroidism, hyperlipidemia, sleep apnea not using CPAP currently with recurrent hospitalizations for hypoxemia and respiratory distress, fluid overload presented back to the emergency room with recurrence of worsening respiratory distress at home.  Apparently, she was not wearing oxygen/ BIPAP at home.  When she was brought to the emergency room she was hemodynamically stable.  VBG with pH of 7.09, CO2 77.  Was with increased work of breathing , started on BiPAP and admitted to the hospital.  Also received hemodialysis. Multiple episodes of CO2 retention and remains critically ill. 12/17, obtunded in the morning rounds with gasping breath.  pH 7.07.  CO2 more than 100. Remained all day and night on BiPAP, woke up.  Received hemodialysis overnight. 12/18, more awake but still drowsy.  Planning for another dialysis today.  Assessment & Plan:   Principal Problem:   Volume overload  Ethics Apparently patient and husband are separated.  Husband not at bedside this morning son implied he wanted to be decision maker.  Doesn't sound like there's been legal separation.  Technically husband should remain decision maker, confirmed with him over the phone.  It sounds like he wants to be decision maker and implies he and Mrs. Corter have had conversations in the past regarding these issues.  Will have low threshold for involvement of ethics/palliative for assistance if issues.   Goals of Care Currently DNR.  Discussed with family today, she's acutely ill.  For now taking things Evangelene Vora day at [facility] time, but tried to impress upon the family that if things don't significantly improve, will be important to have additional GOC  conversations.  Acute on Chronic Hypercarbic Respiratory Failure Obesity Hypoventilation Syndrome Volume Overload  Doesn't appear she was on bipap overnight (note from 20:17 on 12/18 PM mentions bipap, but all vitals noted overnight are for high flow nasal cannula) Recurrent encephalopathy again this morning due to hypercarbia Continue bipap, consider break this PM - needs to be on bipap overnight without interruption and anytime she goes to sleep (per chart review, seems like she was off bipap most of the day yesterday despite my partners note suggesting Lorah Kalina need to stay on bipap most of the time).   Management of volume per renal with dialysis, appreciate assistance Cxr with L effusion with persistent LL base airspace disease, cardiac enlargement with vascular congestion  Acute Metabolic Encephalopathy Due to hypercarbic resp failure Delirium precautions  Leukocytosis Mild, afebrile Watch for other si/sx infection  ESRD Volume per renal  HFrEF  Pulmonary Hypertension Volume per renal EF 20-25%  Atrial Fibrillation Metoprolol, eliquis  CAD S/p LHC cath -> nonobstructive CAD Plavix  T2DM SSI  Hypotension Midodrine  Hypothyroidism Synthroid   Obesity Body mass index is 34.41 kg/m.     DVT prophylaxis: eliquis Code Status: DNR Family Communication: husband, son, sister, niece Disposition:   Status is: Inpatient Remains inpatient appropriate because: need for continued inpatient care   Consultants:  Renal palliative  Procedures:  none  Antimicrobials:  Anti-infectives (From admission, onward)    None       Subjective: Lethargic Family at bedside  Objective: Vitals:   09/26/23 1736 09/26/23 1806 09/26/23 1836 09/26/23 1906  BP: 99/72 (!) 89/47 (!) 102/46 (!) 109/99  Pulse: (!) 111 (!) 109 (!) 103 63  Resp: (!) 24 (!) 23 (!) 22 (!) 33  Temp:      TempSrc:      SpO2:  95% 99% (!) 40%  Weight:      Height:       No intake or output data  in the 24 hours ending 09/26/23 2018 Filed Weights   09/25/23 1745 09/26/23 0345 09/26/23 1518  Weight: 87.4 kg 88.1 kg 88.1 kg    Examination:  General exam: acutely ill appearing Respiratory system: transmitted upper airway sounds Cardiovascular system: tachycardic Gastrointestinal system: Abdomen is nondistended, soft and nontender. Central nervous system: lethargic, staring with eyes wide and mouth open, not speaking, sometimes nodding head Extremities: no LEE    Data Reviewed: I have personally reviewed following labs and imaging studies  CBC: Recent Labs  Lab 09/22/23 1056 09/22/23 1128 09/22/23 1313 09/22/23 2017 09/22/23 2251 09/26/23 1128  WBC 8.8  --   --   --   --  14.7*  NEUTROABS 7.0  --   --   --   --   --   HGB 9.3* 11.2* 7.1* 11.9* 11.9* 9.3*  HCT 34.1* 33.0* 21.0* 35.0* 35.0* 32.2*  MCV 111.8*  --   --   --   --  105.6*  PLT 292  --   --   --   --  362    Basic Metabolic Panel: Recent Labs  Lab 09/22/23 1056 09/22/23 1128 09/22/23 2017 09/22/23 2251 09/23/23 0422 09/24/23 2243 09/26/23 1128  NA 137   < > 133* 128* 135 132* 130*  K 4.0   < > 3.8 5.4* 3.6 3.9 4.9  CL 96*  --   --   --  91* 92* 90*  CO2 29  --   --   --  31 29 22   GLUCOSE 265*  --   --   --  275* 161* 257*  BUN 27*  --   --   --  20 49* 52*  CREATININE 4.63*  --   --   --  3.38* 5.58* 5.08*  CALCIUM 9.7  --   --   --  9.7 9.8 10.8*  PHOS  --   --   --   --   --  5.6*  --    < > = values in this interval not displayed.    GFR: Estimated Creatinine Clearance: 10.5 mL/min (Illona Bulman) (by C-G formula based on SCr of 5.08 mg/dL (H)).  Liver Function Tests: Recent Labs  Lab 09/22/23 1056 09/24/23 2243 09/26/23 1128  AST 20  --  33  ALT 12  --  31  ALKPHOS 68  --  85  BILITOT 0.5  --  0.6  PROT 7.0  --  7.7  ALBUMIN 3.4* 3.4* 3.6    CBG: Recent Labs  Lab 09/25/23 1214 09/25/23 1835 09/25/23 2040 09/26/23 0731 09/26/23 1221  GLUCAP 276* 265* 285* 225* 255*      Recent Results (from the past 240 hours)  MRSA Next Gen by PCR, Nasal     Status: None   Collection Time: 09/23/23  2:08 AM   Specimen: Nasal Mucosa; Nasal Swab  Result Value Ref Range Status   MRSA by PCR Next Gen NOT DETECTED NOT DETECTED Final    Comment: (NOTE) The GeneXpert MRSA Assay (FDA approved for NASAL specimens only), is one component of Ryshawn Sanzone comprehensive MRSA colonization surveillance program. It is not intended to diagnose  MRSA infection nor to guide or monitor treatment for MRSA infections. Test performance is not FDA approved in patients less than 39 years old. Performed at [facility], [address], [place], [place] [address]          Radiology Studies: DG CHEST PORT 1 VIEW Result Date: 09/26/2023 CLINICAL DATA:  Dyspnea EXAM: PORTABLE CHEST 1 VIEW COMPARISON:  09/23/2023, 09/22/2023, 09/11/2023 FINDINGS: Similar cardiac enlargement with vascular congestion. Probable left effusion with persistent left lung base airspace disease. No pneumothorax. IMPRESSION: Similar cardiac enlargement with vascular congestion. Probable left effusion with persistent left lung base airspace disease. No significant change Electronically Signed   By: Jasmine Pang M.D.   On: 09/26/2023 19:17        Scheduled Meds:  apixaban  5 mg Oral BID   Chlorhexidine Gluconate Cloth  6 each Topical Q0600   clopidogrel  75 mg Oral Daily   colchicine  0.3 mg Oral Daily   fluticasone furoate-vilanterol  1 puff Inhalation Daily   insulin aspart  0-5 Units Subcutaneous QHS   insulin aspart  0-6 Units Subcutaneous TID WC   levothyroxine  175 mcg Oral Q0600   midodrine  15 mg Oral Q8H   polyethylene glycol  17 g Oral Daily   Continuous Infusions:   LOS: 4 days    Time spent: 1 hr critical care time with acute on chronic hypercarbic resp failure and encephalopathy    Lacretia Nicks, MD [facility]   To contact the attending provider between 7A-7P or the covering  provider during after hours 7P-7A, please log into the web site [email or web address] and access using universal [place] password for that web site. If you do not have the password, please call the hospital operator.  09/26/2023, 8:18 PM

## 2023-09-26 NOTE — Progress Notes (Signed)
RT NOTE: RT transported patient on bipap from room 6E25 to HD with no complications.

## 2023-09-26 NOTE — Progress Notes (Signed)
MD at the bedside aware of critical Labs and Resp at bedside. Patient placed on BiPap.

## 2023-09-26 NOTE — Progress Notes (Addendum)
[facility] Progress Note   Subjective:   Seen in room - labored mouth breathing, but O2 sats 100% on high flow nasal O2. Dialyzed yesterday - 3L off, did ok with this. Family in room - will order repeat CXR to assess if having improvement, can try another HD today.  Objective Vitals:   09/26/23 0400 09/26/23 0726 09/26/23 0741 09/26/23 1036  BP:   (!) 130/57 134/69  Pulse:  (!) 110    Resp:  (!) 22    Temp:    98.2 F (36.8 C)  TempSrc:   Oral Axillary  SpO2: 100% 100%    Weight:      Height:       Physical Exam General: Chronically ill appearing, on nasal O2, labored mouth breathing. High flow [place] O2 in place Heart: Tachycardic Lungs: CTA anteriorly, labored breathing Abdomen: soft Extremities: no LE edema Dialysis Access: LUE AVG + t/b   Additional Objective Labs: Basic Metabolic Panel: Recent Labs  Lab 09/22/23 1056 09/22/23 1128 09/22/23 2251 09/23/23 0422 09/24/23 2243  NA 137   < > 128* 135 132*  K 4.0   < > 5.4* 3.6 3.9  CL 96*  --   --  91* 92*  CO2 29  --   --  31 29  GLUCOSE 265*  --   --  275* 161*  BUN 27*  --   --  20 49*  CREATININE 4.63*  --   --  3.38* 5.58*  CALCIUM 9.7  --   --  9.7 9.8  PHOS  --   --   --   --  5.6*   < > = values in this interval not displayed.   Liver Function Tests: Recent Labs  Lab 09/22/23 1056 09/24/23 2243  AST 20  --   ALT 12  --   ALKPHOS 68  --   BILITOT 0.5  --   PROT 7.0  --   ALBUMIN 3.4* 3.4*   CBC: Recent Labs  Lab 09/22/23 1056 09/22/23 1128 09/22/23 1313 09/22/23 2017 09/22/23 2251  WBC 8.8  --   --   --   --   NEUTROABS 7.0  --   --   --   --   HGB 9.3*   < > 7.1* 11.9* 11.9*  HCT 34.1*   < > 21.0* 35.0* 35.0*  MCV 111.8*  --   --   --   --   PLT 292  --   --   --   --    < > = values in this interval not displayed.   CBG: Recent Labs  Lab 09/25/23 0749 09/25/23 1214 09/25/23 1835 09/25/23 2040 09/26/23 0731  GLUCAP 146* 276* 265* 285* 225*   Medications:    apixaban  5 mg Oral BID   Chlorhexidine Gluconate Cloth  6 each Topical Q0600   clopidogrel  75 mg Oral Daily   colchicine  0.3 mg Oral Daily   fluticasone furoate-vilanterol  1 puff Inhalation Daily   insulin aspart  0-5 Units Subcutaneous QHS   insulin aspart  0-6 Units Subcutaneous TID WC   levothyroxine  175 mcg Oral Q0600   midodrine  15 mg Oral Q8H   polyethylene glycol  17 g Oral Daily    Dialysis Orders: TTS - [facility] 4hr, 400/600, EDW 96.5kg, 2K/2Ca bath, LUE AVG, hepariin 2000 initial + 2000 unit mid-run bolus   Assessment/Plan: Acute on chronic hypoxic respiratory failure: Recurrent admits for overload/dyspnea  requiring BiPAP which improves with serial HD. S/p HD 12/17 with 2.8L off, then again 12/19 with 3L off.  Hypotension: BP better today, on midodrine 15mg  TID and can use Iv albumin prn. Hold/reduce midodrine if BP remains ok. Hx LLL lung cancer s/p "curative" radiation, now with concern for recurrence per last oncology note - is supposed to have a PET scan this week. Consider weighing in with her oncologist, will miss PET scan (for tomorrow). If cancer is back, then this would be helpful information for GOC discussions. ESRD: Usual TTS schedule - see above. HD again today - 3rd in a row. Another 3L UFG, no heparin.  HTN: Prev on metoprolol - on hold, no LE edema but CXR with persistent pulm edema. Now well below prior EDW. Anemia of ESRD: Hgb 11.9, no ESA for now. Secondary HPTH: CorrCa/Phos both a little high, no VDRA for now. Restart binder if Phos rises further. HFrEF (EF 20-25% in 09/2023) T1DM A-fib: On Eliquis GOC: D/w family today - serial HD to see if can get her fluid down to the point where she stabilizes, but recurrent admits for the same and clearly she is declining. Family (nieces) understands, considering options.   ADDENDUM (16:33p): Seen on HD - cannulated 5 times before able to get treatment running, pulling clots from her AVG - likely combination of  serial HD and intermittent hypotension. Will see how HD goes - hopefully can get a couple liters fluid off her. Suspect would be f'gram -- too unstable for procedure. Spoke to husband, Mathis Fare, by phone. Husband still wanting everything done at this venture - d/w him, he would be agreeable for dialysis catheter if needed.   Ozzie Hoyle, PA-C 09/26/2023, 10:48 AM  [employer]

## 2023-09-26 NOTE — TOC Initial Note (Addendum)
Transition of Care [facility]) - Initial/Assessment Note    Patient Details  Name: [name] [name] MRN: [id number] Date of Birth: 03/17/1951  Transition of Care [facility]) CM/SW Contact:    Lockie Pares, RN Phone Number: 09/26/2023, 11:41 AM  Clinical Narrative:                    Readmission from 2 weeks ago. Has Walker, Bipap and oxygen at home. Patient not always wearing oxygen and using BiPap. She is active with [facility] for PT OT home health. Co2 100 on admission. Palliative care awaiting patient to  be less somnolent to speak about  goals of care.  12/19 Son Ray had a question concerning NOK decision making. She has been split from her husband for 4 years, however has not legally split, according to son. Discussed chain of decision makers and MPOA should she be more alert and oriented. Hopeful that the husband would make decisions with family, encouraged son to communicate via phone should he have more questions.  Received a message from Care Manager office staff that daughter of patient was also inquiring about decision making. She stated to them that both husband and son did not want to have "anything to do" with decision making. .  Legally speaking NOK reverts to husband as they  are not legally separate, the children, then parent, siblings. The patient is currently altered and not able to make decisions at this time. It is recommended that if she wakes and is alert and oriented that she designates a MPOA. Spirtual consult should be done when patient is alert.   Continue to follow for needs, recommendations ,and transitions of care.     Patient Goals and CMS Choice            Expected Discharge Plan and Services       Living arrangements for the past 2 months: Single Family Home                                      Prior Living Arrangements/Services Living arrangements for the past 2 months: Single Family Home Lives with:: Adult Children Patient language and need  for interpreter reviewed:: Yes        Need for Family Participation in Patient Care: Yes (Comment) Care giver support system in place?: Yes (comment) Current home services: Home OT, Home PT, DME Criminal Activity/Legal Involvement Pertinent to Current Situation/Hospitalization: No - Comment as needed  Activities of Daily Living   ADL Screening (condition at time of admission) Independently performs ADLs?: No Does the patient have a NEW difficulty with bathing/dressing/toileting/self-feeding that is expected to last >3 days?: No Does the patient have a NEW difficulty with getting in/out of bed, walking, or climbing stairs that is expected to last >3 days?: No Does the patient have a NEW difficulty with communication that is expected to last >3 days?: No Is the patient deaf or have difficulty hearing?: No Does the patient have difficulty seeing, even when wearing glasses/contacts?: No Does the patient have difficulty concentrating, remembering, or making decisions?: Yes  Permission Sought/Granted                  Emotional Assessment       Orientation: : Oriented to Self, Oriented to Place Alcohol / Substance Use: Not Applicable Psych Involvement: No (comment)  Admission diagnosis:  Volume overload [E87.70] Acute respiratory failure with  hypoxia and hypercapnia (HCC) [J96.01, J96.02] Acute on chronic congestive heart failure, unspecified heart failure type [facility]) [I50.9] Patient Active Problem List   Diagnosis Date Noted   Volume overload 09/22/2023   Acute on chronic systolic CHF (congestive heart failure) (HCC) 09/10/2023   Paroxysmal atrial fibrillation (HCC) 09/10/2023   Hypotension 08/22/2023   Acute encephalopathy 08/22/2023   Acute congestive heart failure (HCC) 08/22/2023   Acute on chronic respiratory failure with hypoxia and hypercapnia (HCC) 07/15/2023   Acute metabolic encephalopathy 07/15/2023   Septic shock (HCC) 07/15/2023   Acute respiratory failure with  hypercapnia (HCC) 07/11/2023   CAD (coronary artery disease) 07/06/2023   ESRD on dialysis (HCC) 07/05/2023   Acute respiratory failure (HCC) 08/07/2022   Respiratory failure, post-operative (HCC) 08/07/2022   Pulmonary edema 08/07/2022   Acute on chronic respiratory failure with hypercapnia (HCC) 08/07/2022   Lung mass 07/30/2022   Lung cancer, lower lobe (HCC) 01/31/2022   Pressure injury of skin 12/06/2021   Malnutrition of moderate degree 12/01/2021   ESRD (end stage renal disease) on dialysis [facility])    Lung nodules    Advanced care planning/counseling discussion    Goals of care, counseling/discussion    Acute respiratory failure with hypoxia and hypercarbia (HCC)    Labored breathing    Kidney lesion, native, left 11/24/2021   Syncope 11/21/2021   Chronic pain syndrome 07/01/2021   COPD exacerbation (HCC) 07/01/2021   Cellulitis of leg, right 07/01/2021   SARS-CoV-2 positive 02/21/2021   Increased weakness when ambulating 02/17/2021   Intractable nausea and vomiting 02/15/2021   Symptomatic anemia 01/24/2021   Anemia, unspecified 01/08/2020   Bacteremia 01/07/2020   UTI (urinary tract infection) 01/06/2020   Anemia 10/26/2019   S/P lumbar fusion 09/18/2019   Acute on chronic combined systolic and diastolic CHF (congestive heart failure) (HCC) 03/16/2019   Leukocytosis 03/16/2019   Chest pain 11/27/2018   Pre-syncope 11/27/2018   Epigastric abdominal pain 11/27/2018   Solitary pulmonary nodule 11/06/2018   Hypertensive heart and renal disease, stage 1-4 or unspecified chronic kidney disease, with heart failure (HCC) 10/20/2018   CHF (congestive heart failure), NYHA class III, acute on chronic, systolic (HCC) 10/20/2018   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, unspecified extremity (HCC) 04/19/2016   Cellulitis of abdominal wall 03/21/2015   Acute on chronic renal failure (HCC) 03/21/2015   DM (diabetes mellitus), type 2 with renal complications  (HCC) 03/21/2015   Abdominal wall abscess 03/21/2015   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   Low urine output 08/11/2014   COPD with asthma (HCC) 08/11/2014   Hypothyroidism 08/11/2014   OSA (obstructive sleep apnea) 08/11/2014   Positive cardiac stress test 06/29/2014   Hyperlipidemia 02/12/2014   Left arm weakness 12/23/2013   TIA (transient ischemic attack) 12/23/2013   Left buttock abscess 10/12/2013   Cellulitis and abscess of leg, right 04/16/2013   Acute blood loss anemia 02/14/2013   HTN (hypertension) 02/14/2013   Cellulitis and abscess of buttock 02/09/2013   Class 2 obesity due to excess calories with body mass index (BMI) of 36.0 to 36.9 in adult 02/03/2013   Diarrhea 02/03/2013   Hyponatremia 02/03/2013   Sepsis due to undetermined organism (HCC) 02/02/2013   Diabetes mellitus without complication (HCC) 02/02/2013   CKD (chronic kidney disease), stage IV (HCC) 02/02/2013   Cellulitis 02/02/2013   PAD (peripheral artery disease) (HCC) 04/26/2011   Tobacco abuse 04/26/2011   PCP:  Vladimir Crofts, FNP Pharmacy:   [id number] [place], [place] - [address] AT [facility] [address] [place] [address] Phone: [phone] Fax: [phone]  [facility] [address] [place] [address] Phone: [phone] Fax: [phone]     Social Drivers of Health (SDOH) Social History: SDOH Screenings   Food Insecurity: No Food Insecurity (09/22/2023)  Housing: Low Risk  (09/23/2023)  Transportation Needs: No Transportation Needs (09/22/2023)  Utilities: Not At Risk (09/22/2023)  Tobacco Use: Medium Risk (09/22/2023)   SDOH Interventions:     Readmission Risk Interventions    08/21/2023    3:23 PM 05/09/2023    3:37 PM 05/08/2023   12:17 PM  Readmission Risk Prevention Plan  Transportation Screening Complete Complete Complete  Medication Review (RN Care  Manager) Referral to Pharmacy Complete Complete  PCP or Specialist appointment within 3-5 days of discharge Complete Complete Complete  HRI or Home Care Consult Complete Complete Complete  SW Recovery Care/Counseling Consult Complete Complete Complete  Palliative Care Screening Not Applicable Not Applicable Not Applicable  Skilled Nursing Facility Not Applicable Complete Complete

## 2023-09-27 ENCOUNTER — Other Ambulatory Visit (HOSPITAL_COMMUNITY): Payer: 59

## 2023-09-27 ENCOUNTER — Inpatient Hospital Stay: Payer: No Typology Code available for payment source

## 2023-09-27 ENCOUNTER — Ambulatory Visit: Payer: No Typology Code available for payment source | Admitting: General Practice

## 2023-09-27 ENCOUNTER — Ambulatory Visit (HOSPITAL_COMMUNITY): Payer: No Typology Code available for payment source

## 2023-09-27 DIAGNOSIS — I5020 Unspecified systolic (congestive) heart failure: Secondary | ICD-10-CM | POA: Insufficient documentation

## 2023-09-27 DIAGNOSIS — J9602 Acute respiratory failure with hypercapnia: Secondary | ICD-10-CM | POA: Diagnosis not present

## 2023-09-27 DIAGNOSIS — J9601 Acute respiratory failure with hypoxia: Secondary | ICD-10-CM | POA: Diagnosis not present

## 2023-09-27 DIAGNOSIS — E669 Obesity, unspecified: Secondary | ICD-10-CM | POA: Insufficient documentation

## 2023-09-27 LAB — BLOOD GAS, VENOUS
Acid-Base Excess: 0.1 mmol/L (ref 0.0–2.0)
Acid-base deficit: 2.9 mmol/L — ABNORMAL HIGH (ref 0.0–2.0)
Bicarbonate: 27.1 mmol/L (ref 20.0–28.0)
Bicarbonate: 27.2 mmol/L (ref 20.0–28.0)
O2 Saturation: 94.7 %
O2 Saturation: 98.9 %
Patient temperature: 36.7
Patient temperature: 36.9
pCO2, Ven: 54 mm[Hg] (ref 44–60)
pCO2, Ven: 73 mm[Hg] (ref 44–60)
pH, Ven: 7.3 (ref 7.25–7.43)
pH, Ven: 7.18 — CL (ref 7.25–7.43)
pO2, Ven: 73 mm[Hg] — ABNORMAL HIGH (ref 32–45)
pO2, Ven: 75 mm[Hg] — ABNORMAL HIGH (ref 32–45)

## 2023-09-27 LAB — CBC WITH DIFFERENTIAL/PLATELET
Abs Immature Granulocytes: 0.41 10*3/uL — ABNORMAL HIGH (ref 0.00–0.07)
Basophils Absolute: 0.1 10*3/uL (ref 0.0–0.1)
Basophils Relative: 1 %
Eosinophils Absolute: 0.1 10*3/uL (ref 0.0–0.5)
Eosinophils Relative: 1 %
HCT: 32 % — ABNORMAL LOW (ref 36.0–46.0)
Hemoglobin: 9.3 g/dL — ABNORMAL LOW (ref 12.0–15.0)
Immature Granulocytes: 3 %
Lymphocytes Relative: 3 %
Lymphs Abs: 0.4 10*3/uL — ABNORMAL LOW (ref 0.7–4.0)
MCH: 30 pg (ref 26.0–34.0)
MCHC: 29.1 g/dL — ABNORMAL LOW (ref 30.0–36.0)
MCV: 103.2 fL — ABNORMAL HIGH (ref 80.0–100.0)
Monocytes Absolute: 1 10*3/uL (ref 0.1–1.0)
Monocytes Relative: 8 %
Neutro Abs: 11.5 10*3/uL — ABNORMAL HIGH (ref 1.7–7.7)
Neutrophils Relative %: 84 %
Platelets: 279 10*3/uL (ref 150–400)
RBC: 3.1 MIL/uL — ABNORMAL LOW (ref 3.87–5.11)
RDW: 16.6 % — ABNORMAL HIGH (ref 11.5–15.5)
WBC: 13.6 10*3/uL — ABNORMAL HIGH (ref 4.0–10.5)
nRBC: 10.2 % — ABNORMAL HIGH (ref 0.0–0.2)

## 2023-09-27 LAB — COMPREHENSIVE METABOLIC PANEL
ALT: 28 U/L (ref 0–44)
AST: 50 U/L — ABNORMAL HIGH (ref 15–41)
Albumin: 3.8 g/dL (ref 3.5–5.0)
Alkaline Phosphatase: 81 U/L (ref 38–126)
Anion gap: 20 — ABNORMAL HIGH (ref 5–15)
BUN: 33 mg/dL — ABNORMAL HIGH (ref 8–23)
CO2: 23 mmol/L (ref 22–32)
Calcium: 10.8 mg/dL — ABNORMAL HIGH (ref 8.9–10.3)
Chloride: 88 mmol/L — ABNORMAL LOW (ref 98–111)
Creatinine, Ser: 3.32 mg/dL — ABNORMAL HIGH (ref 0.44–1.00)
GFR, Estimated: 14 mL/min — ABNORMAL LOW (ref >60)
Glucose, Bld: 203 mg/dL — ABNORMAL HIGH (ref 70–99)
Potassium: 4.7 mmol/L (ref 3.5–5.1)
Sodium: 131 mmol/L — ABNORMAL LOW (ref 135–145)
Total Bilirubin: 1.5 mg/dL — ABNORMAL HIGH (ref <1.2)
Total Protein: 7.8 g/dL (ref 6.5–8.1)

## 2023-09-27 LAB — PHOSPHORUS: Phosphorus: 3.4 mg/dL (ref 2.5–4.6)

## 2023-09-27 LAB — GLUCOSE, CAPILLARY
Glucose-Capillary: 214 mg/dL — ABNORMAL HIGH (ref 70–99)
Glucose-Capillary: 239 mg/dL — ABNORMAL HIGH (ref 70–99)
Glucose-Capillary: 150 mg/dL — ABNORMAL HIGH (ref 70–99)

## 2023-09-27 LAB — MAGNESIUM: Magnesium: 1.8 mg/dL (ref 1.7–2.4)

## 2023-09-27 MED ORDER — METHYLPREDNISOLONE SODIUM SUCC 40 MG IJ SOLR
40.0000 mg | Freq: Every day | INTRAMUSCULAR | Status: DC
Start: 2023-09-27 — End: 2023-09-28
  Administered 2023-09-27: 40 mg via INTRAVENOUS

## 2023-09-27 MED ORDER — LEVALBUTEROL HCL 1.25 MG/0.5ML IN NEBU
1.2500 mg | INHALATION_SOLUTION | Freq: Four times a day (QID) | RESPIRATORY_TRACT | Status: DC
Start: 2023-09-27 — End: 2023-09-28
  Filled 2023-09-27 (×5): qty 0.5

## 2023-10-07 ENCOUNTER — Ambulatory Visit: Payer: 59 | Admitting: Internal Medicine

## 2023-10-09 NOTE — Progress Notes (Signed)
Late Note Entry- Sep 30, 2023  Contacted out-pt HD clinic this am to advise clinic of pt's passing on Friday.   Olivia Canter Renal Navigator [phone]

## 2023-10-09 NOTE — Plan of Care (Signed)

## 2023-10-09 NOTE — Progress Notes (Deleted)
Cardiology Clinic Note   Patient Name: [name] [name] Date of Encounter: 09/24/2023  Primary Care Provider:  Vladimir Crofts, FNP Primary Cardiologist:  Nanetta Batty, MD  Patient Profile     [name] [name] 73 year old female presents the clinic today for follow-up evaluation of her chronic combined systolic and diastolic CHF.  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   Full dentures    GERD (gastroesophageal reflux disease)    Gout    History of hiatal hernia    History of transfusion    Hx MRSA infection    abscess left groin   Hyperlipidemia    Hypertension    Hypothyroid    Intractable nausea and vomiting 02/15/2021   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HCC)    Peripheral vascular disease (HCC)    Sleep apnea    UNABLE TO TOLERATE C PAP   Stress incontinence    TIA (transient ischemic attack)     X2 NO RESIDUAL PROBLEMS   Wears glasses    Past Surgical History:  Procedure Laterality Date   ABDOMINAL HYSTERECTOMY     AV FISTULA PLACEMENT Left 12/01/2021   Procedure: INSERTION OF LEFT ARM ARTERIOVENOUS (AV) GORE-TEX GRAFT;  Surgeon: Nada Libman, MD;  Location: [facility] OR;  Service: Vascular;  Laterality: Left;   BACK SURGERY  2020   BASCILIC VEIN TRANSPOSITION Left 04/05/2020   Procedure: BASILIC VEIN TRANSPOSITION FIRST STAGE;  Surgeon: Chuck Hint, MD;  Location: [facility] OR;  Service: Vascular;  Laterality: Left;   BIOPSY  08/28/2023   Procedure: BIOPSY;  Surgeon: Kerin Salen, MD;  Location: [facility] ENDOSCOPY;  Service: Gastroenterology;;   BOTOX INJECTION   08/28/2023   Procedure: BOTOX INJECTION;  Surgeon: Kerin Salen, MD;  Location: [facility] ENDOSCOPY;  Service: Gastroenterology;;   BRONCHIAL BIOPSY  01/23/2022   Procedure: BRONCHIAL BIOPSIES;  Surgeon: Josephine Igo, DO;  Location: [facility] ENDOSCOPY;  Service: Pulmonary;;   BRONCHIAL BIOPSY  08/07/2022   Procedure: BRONCHIAL BIOPSIES;  Surgeon: Josephine Igo, DO;  Location: [facility] ENDOSCOPY;  Service: Pulmonary;;   BRONCHIAL BRUSHINGS  08/07/2022   Procedure: BRONCHIAL BRUSHINGS;  Surgeon: Josephine Igo, DO;  Location: [facility] ENDOSCOPY;  Service: Pulmonary;;   BRONCHIAL NEEDLE ASPIRATION BIOPSY  01/23/2022   Procedure: BRONCHIAL NEEDLE ASPIRATION BIOPSIES;  Surgeon: Josephine Igo, DO;  Location: [facility] ENDOSCOPY;  Service: Pulmonary;;   BRONCHIAL NEEDLE ASPIRATION BIOPSY  08/07/2022   Procedure: BRONCHIAL NEEDLE ASPIRATION BIOPSIES;  Surgeon: Josephine Igo, DO;  Location: [facility] ENDOSCOPY;  Service: Pulmonary;;   COLON SURGERY  1995   DUE TO POLYP   DILATION AND CURETTAGE OF UTERUS     ENDOBRONCHIAL ULTRASOUND  01/23/2022   Procedure: ENDOBRONCHIAL ULTRASOUND;  Surgeon: Josephine Igo, DO;  Location: [facility] ENDOSCOPY;  Service: Pulmonary;;   ENTEROSCOPY N/A 07/10/2021   Procedure: ENTEROSCOPY;  Surgeon: Kerin Salen, MD;  [facility] ENDOSCOPY;  Service: Gastroenterology;  Laterality: N/A;   ESOPHAGEAL DILATION  08/28/2023   Procedure: ESOPHAGEAL BALLOON  DILATION;  Surgeon: Kerin Salen, MD;  Location: [facility] ENDOSCOPY;  Service: Gastroenterology;;   ESOPHAGOGASTRODUODENOSCOPY N/A 01/25/2021   Procedure: ESOPHAGOGASTRODUODENOSCOPY (EGD);  Surgeon: Vida Rigger, MD;  Location: [facility] ENDOSCOPY;  Service: Endoscopy;  Laterality: N/A;   ESOPHAGOGASTRODUODENOSCOPY (EGD) WITH PROPOFOL N/A 08/28/2023   Procedure: ESOPHAGOGASTRODUODENOSCOPY (EGD) WITH PROPOFOL;  Surgeon: Kerin Salen, MD;  Location: [facility] ENDOSCOPY;  Service: Gastroenterology;  Laterality: N/A;   FINE NEEDLE ASPIRATION  01/23/2022   Procedure: FINE NEEDLE  ASPIRATION (FNA) LINEAR;  Surgeon: Josephine Igo, DO;  Location: [facility] ENDOSCOPY;  Service: Pulmonary;;   FOREARM FRACTURE SURGERY     Left arm   GIVENS CAPSULE STUDY N/A 07/07/2021   Procedure: GIVENS CAPSULE STUDY;  Surgeon: Vida Rigger, MD;  Location: [facility] ENDOSCOPY;  Service: Endoscopy;  Laterality: N/A;   HERNIA REPAIR     w/ mesh   HOT HEMOSTASIS N/A 07/10/2021   Procedure: HOT HEMOSTASIS (ARGON PLASMA COAGULATION/BICAP);  Surgeon: Kerin Salen, MD;  Location: [facility] ENDOSCOPY;  Service: Gastroenterology;  Laterality: N/A;   INCISION AND DRAINAGE ABSCESS Left 02/04/2013   Procedure: INCISION AND DRAINAGE LEFT BUTTOCK ABSCESS; INCISION AND DRAINAGE LEFT BREAST ABSCESS;  Surgeon: Shelly Rubenstein, MD;  Location: [facility] OR;  Service: General;  Laterality: Left;   INCISION AND DRAINAGE ABSCESS N/A 02/12/2013   Procedure: INCISION AND DEBRIDEMENT BUTTOCK WOUND ;  Surgeon: Wilmon Arms. Corliss Skains, MD;  Location: [facility] OR;  Service: General;  Laterality: N/A;   INCISION AND DRAINAGE ABSCESS N/A 02/14/2013   Procedure: INCISION AND DRAINAGE/DRESSING CHANGE;  Surgeon: Shelly Rubenstein, MD;  Location: [facility] OR;  Service: General;  Laterality: N/A;   INCISION AND DRAINAGE ABSCESS N/A 03/21/2015   Procedure: INCISION AND DRAINAGE PUBIC ABSCESS;  Surgeon: Glenna Fellows, MD;  Location: [facility] ORS;  Service: General;  Laterality: N/A;   INCISION AND DRAINAGE PERIRECTAL ABSCESS Left 02/10/2013   Procedure: IRRIGATION AND DEBRIDEMENT OF BUTTOCK/PERINEAL ABSCESS;  Surgeon: Wilmon Arms. Corliss Skains, MD;  Location: [facility] OR;  Service: General;  Laterality: Left;   INCISION AND DRAINAGE PERIRECTAL ABSCESS N/A 02/16/2013   Procedure: IRRIGATION AND DEBRIDEMENT PERINEAL ABSCESS;  Surgeon: Liz Malady, MD;  Location: [facility] OR;  Service: General;  Laterality: N/A;   IR FLUORO GUIDE CV LINE RIGHT  11/30/2021   IR FLUORO GUIDE CV LINE RIGHT  08/22/2023   IR REMOVAL TUN CV CATH W/O FL  08/29/2023   IR US GUIDE VASC ACCESS RIGHT  11/30/2021   IR US GUIDE  VASC ACCESS RIGHT  08/22/2023   IRRIGATION AND DEBRIDEMENT ABSCESS N/A 02/06/2013   Procedure: IRRIGATION AND DEBRIDEMENT BUTTOCK ABSCESS AND DRESSING CHANGE;  Surgeon: Shelly Rubenstein, MD;  [facility];  Service: General;  Laterality: N/A;   IRRIGATION AND DEBRIDEMENT ABSCESS Left 02/08/2013   Procedure: IRRIGATION AND DEBRIDEMENT ABSCESS/DRESSING CHANGE;  Surgeon: Cherylynn Ridges, MD;  Location: [facility] OR;  Service: General;  Laterality: Left;   JOINT REPLACEMENT  2010 / 2012    LAPAROSCOPIC CHOLECYSTECTOMY     LEFT HEART CATH AND CORONARY ANGIOGRAPHY N/A 07/05/2023   Procedure: LEFT HEART CATH AND CORONARY ANGIOGRAPHY;  Surgeon: Lennette Bihari, MD;  [facility] INVASIVE CV LAB;  Service: Cardiovascular;  Laterality: N/A;   LEFT HEART CATHETERIZATION WITH CORONARY ANGIOGRAM N/A 07/02/2014   Procedure: LEFT HEART CATHETERIZATION WITH CORONARY ANGIOGRAM;  Surgeon: Runell Gess, MD;  Location: [facility] CATH LAB;  Service: Cardiovascular;  Laterality: N/A;   LOWER EXTREMITY ANGIOGRAM Right 04/20/2016   Procedure: Lower Extremity Angiogram;  Surgeon: Janetta Hora  Fields, MD;  [facility]: [facility] INVASIVE CV LAB;  Service: Cardiovascular;  Laterality: Right;   MULTIPLE TOOTH EXTRACTIONS     PERIPHERAL VASCULAR CATHETERIZATION N/A 04/20/2016   Procedure: Abdominal Aortogram;  Surgeon: Sherren Kerns, MD;  Location: [facility] INVASIVE CV LAB;  Service: Cardiovascular;  Laterality: N/A;   PERIPHERAL VASCULAR CATHETERIZATION Right 04/20/2016   Procedure: Peripheral Vascular Intervention;  Surgeon: Sherren Kerns, MD;  Location: [facility] INVASIVE CV LAB;  Service: Cardiovascular;  Laterality: Right;  popiteal   RIGHT HEART CATH N/A 10/27/2018   Procedure: RIGHT HEART CATH;  Surgeon: Laurey Morale, MD;  Location: [facility] INVASIVE CV LAB;  Service: Cardiovascular;  Laterality: N/A;   RIGHT HEART CATH N/A 03/20/2019   Procedure: RIGHT HEART CATH;  Surgeon: Laurey Morale, MD;  Location: [facility] INVASIVE CV LAB;  Service: Cardiovascular;   Laterality: N/A;   THYROIDECTOMY     TOTAL HIP REVISION Right 08/11/2014   Procedure: RIGHT ACETABULAR REVISION;  Surgeon: Loanne Drilling, MD;  Location: [facility] ORS;  Service: Orthopedics;  Laterality: Right;   VASCULAR SURGERY     VIDEO BRONCHOSCOPY WITH ENDOBRONCHIAL ULTRASOUND Bilateral 08/07/2022   Procedure: VIDEO BRONCHOSCOPY WITH ENDOBRONCHIAL ULTRASOUND;  Surgeon: Josephine Igo, DO;  Location: [facility] ENDOSCOPY;  Service: Pulmonary;  Laterality: Bilateral;   VIDEO BRONCHOSCOPY WITH RADIAL ENDOBRONCHIAL ULTRASOUND  01/23/2022   Procedure: RADIAL ENDOBRONCHIAL ULTRASOUND;  Surgeon: Josephine Igo, DO;  Location: [facility] ENDOSCOPY;  Service: Pulmonary;;    Allergies  Allergies  Allergen Reactions   Ace Inhibitors Anaphylaxis and Swelling    Angioedema with lisinopril   Hydralazine Other (See Comments)    Patient reported dizziness, headache, visual changes   Nebivolol Swelling    Chest pain (reaction to Bystolic)   Morphine And Codeine Itching   Rosuvastatin Other (See Comments)    myalgias    History of Present Illness     [name] [name] is a PMH of diabetes, HTN, HLD, ESRD, COPD, peripheral vascular disease, prior TIA, small cell lung cancer, and chest pain.  She underwent cardiac catheterization in 2015 which showed no significant coronary disease.  Her echocardiogram 2/23 showed an EF of 40-45%, mild LVH, G1 DD, and severe left atrial enlargement.  Mild aortic stenosis with a mean gradient of 8 mmHg was also noted.  She was admitted with chest pain on 04/13/2023 and discharged on 04/17/2023.  Her repeat echocardiogram showed an LVEF of 35-40% and no new wall motion abnormalities.  Quantifying her pain was very difficult.  Outpatient PET scan was recommended.  She was continued on HD for her ESRD.  Her blood pressure was noted to be labile and her medications were unable to be titrated.  Her fluid volume is managed by HD.  No heart failure medications were able to be titrated at that  time.  She presented to the clinic 07/01/23 for follow-up evaluation and stated she noticed episodes of chest pressure with not having her oxygen on and when she became angry.  She denied chest pain during her visit.  She continued with 3 L of oxygen nasal cannula.  She reported compliance with hemodialysis Tuesday Thursday Saturday.  We reviewed her cardiac PET/CT.  She expressed understanding.  I reviewed case with DOD.  We will proceed to [facility].  I will order CBC and BMP.  She underwent [facility] 9/24 which showed mild nonobstructive CAD.  She followed up with Bernadene Person NP on 08/19/2023.  She continued to note intermittent chest discomfort with laying down and  significant stress.  Her breathing was stable.  She was noted to have nonpitting bilateral lower extremity edema.  She continued to be compliant with HD.  She was maintaining sinus rhythm.  She was admitted to the hospital on 09/22/2023 with cough and increased shortness of breath.  Her blood pressure was noted to be 127/43.  She was diagnosed with acute hypoxic hypercarbic respiratory failure secondary to acute on chronic CHF with reduced EF.  Her chest x-ray showed cardiomegaly.  On exam she was noted to be fluid volume overloaded with crackles and her lung bases.  She was placed on BiPAP.  Nephrology was consulted for dialysis.  She was also noted to be anemic.  She was felt to need EPO with dialysis.  Her symptoms were not felt to be associated with ACS.  Her cardiac troponins were elevated however, these were felt to be elevated in the setting of fluid volume overload.  She was maintained on metoprolol and apixaban for her paroxysmal atrial fibrillation.  She presents to the clinic today for follow-up evaluation and states***.  Today she denies increased shortness of breath, lower extremity edema, fatigue, palpitations, melena, hematuria, hemoptysis, diaphoresis, weakness, presyncope, syncope, orthopnea, and PND.   Chronic combined systolic and  diastolic CHF-weight today 2***03 lbs.  Fluid volume managed by HD.  EF noted to be around 25% on cardiac PET/CT.  Unable to titrate GDMT due to BP. Continue metoprolol Heart healthy low-sodium diet  Chest discomfort-continues with no chest pain today.  Cardiac PET CT on 06/05/2023.  She was noted to have a small area of infarct with peri-infarct ischemia in the inferior wall.  Her study was noted to be high risk.  Cardiac catheterization reassuring.  Details above.   Continue current medical therapy Heart healthy low-sodium diet  Paroxysmal atrial fibrillation-heart rate today***.  Denies bleeding issues.  EPO via HD. Continue metoprolol, apixaban Labs monitored by nephrology  Essential hypertension-BP today 9***0/60. Maintain blood pressure log Continue HD  Hyperlipidemia-LDL 78 on 04/13/23. Heart healthy low-sodium high-fiber diet Continue current medical therapy Plan for repeat fasting lipids and LFTs 7/25  Disposition: Follow-up with Dr. Allyson Sabal or me in 3-4 months.   Home Medications    Prior to Admission medications   Medication Sig Start Date End Date Taking? Authorizing Provider  acetaminophen (TYLENOL) 500 MG tablet Take 1 tablet (500 mg total) by mouth See admin instructions. Take 1,000mg  (2 tablets) by mouth twice a day in addition to 1,000mg  (2 tablets) twice daily as needed for pain. 05/14/23   Ghimire, Werner Lean, MD  albuterol (PROVENTIL) (2.5 MG/3ML) 0.083% nebulizer solution Take 3 mLs (2.5 mg total) by nebulization every 6 (six) hours as needed for wheezing or shortness of breath. 05/14/23   Ghimire, Werner Lean, MD  albuterol (VENTOLIN HFA) 108 (90 Base) MCG/ACT inhaler Inhale 2 puffs into the lungs every 6 (six) hours as needed for shortness of breath (COPD). 05/14/23   Ghimire, Werner Lean, MD  allopurinol (ZYLOPRIM) 100 MG tablet Take 1 tablet (100 mg total) by mouth daily. 05/14/23   Ghimire, Werner Lean, MD  budesonide-formoterol (SYMBICORT) 160-4.5 MCG/ACT inhaler Inhale 2 puffs  into the lungs in the morning and at bedtime. 05/14/23   Ghimire, Werner Lean, MD  carboxymethylcellulose (REFRESH PLUS) 0.5 % SOLN Place 1 drop into both eyes 3 (three) times daily as needed (dry/irritated eyes.).    [provider]  carvedilol (COREG) 6.25 MG tablet Take 1 tablet (6.25 mg total) by mouth 2 (two) times daily  with a meal. 04/17/23   Joseph Art, DO  clopidogrel (PLAVIX) 75 MG tablet Take 1 tablet (75 mg total) by mouth daily. 05/14/23   Ghimire, Werner Lean, MD  diclofenac Sodium (VOLTAREN) 1 % GEL Apply 2 g topically 4 (four) times daily. 04/17/23   Joseph Art, DO  docusate sodium (COLACE) 100 MG capsule Take 1 capsule (100 mg total) by mouth daily. 05/14/23   Ghimire, Werner Lean, MD  fluticasone (FLONASE) 50 MCG/ACT nasal spray Place 1 spray into both nostrils daily. 05/14/23   Ghimire, Werner Lean, MD  insulin lispro (HUMALOG KWIKPEN) 100 UNIT/ML KwikPen 0-15 Units, Subcutaneous, 3 times daily with meal CBG < 70: Implement Hypoglycemia meausers CBG 70 - 120: 0 units CBG 121 - 150: 2 units CBG 151 - 200: 3 units CBG 201 - 250: 5 units CBG 251 - 300: 8 units CBG 301 - 350: 11 units CBG 351 - 400: 15 units CBG > 400: call MD 05/14/23   Maretta Bees, MD  levothyroxine (SYNTHROID) 175 MCG tablet Take 1 tablet (175 mcg total) by mouth daily. 05/14/23   Ghimire, Werner Lean, MD  lidocaine 4 % Place 2 patches onto the skin daily. Each lower side of back. 05/14/23   Ghimire, Werner Lean, MD  lidocaine-prilocaine (EMLA) cream Apply 1 Application topically See admin instructions. On dialysis days (Tues,Thurs,Sat) 02/13/22   [provider]  Multiple Vitamins-Iron (MULTIVITAMINS WITH IRON) TABS tablet Take 1 tablet by mouth in the morning. 05/14/23   Ghimire, Werner Lean, MD  Nutritional Supplements (FEEDING SUPPLEMENT, NEPRO CARB STEADY,) LIQD Take 237 mLs by mouth 2 (two) times daily between meals. 04/17/23   Joseph Art, DO  OXYGEN Inhale into the lungs.    [provider]   pantoprazole (PROTONIX) 40 MG tablet Take 1 tablet (40 mg total) by mouth 2 (two) times daily before a meal. 05/14/23   Ghimire, Werner Lean, MD  pregabalin (LYRICA) 25 MG capsule Take 25 mg by mouth daily. 05/29/23   [provider]  sevelamer carbonate (RENVELA) 800 MG tablet Take 2 tablets (1,600 mg total) by mouth 3 (three) times daily with meals. 05/14/23   Ghimire, Werner Lean, MD  thiamine (VITAMIN B1) 100 MG tablet Take 1 tablet (100 mg total) by mouth in the morning. 05/14/23   Ghimire, Werner Lean, MD  umeclidinium bromide (INCRUSE ELLIPTA) 62.5 MCG/ACT AEPB Inhale 1 puff into the lungs daily. 05/14/23   Ghimire, Werner Lean, MD    Family History    Family History  Problem Relation Age of Onset   Diabetes Brother    Cardiomyopathy Mother    Heart disease Mother    Cancer Father    She indicated that her mother is deceased. She indicated that her father is deceased. She indicated that her brother is alive. She indicated that her maternal grandmother is deceased. She indicated that her maternal grandfather is deceased. She indicated that her paternal grandmother is deceased. She indicated that her paternal grandfather is deceased.  Social History    Social History   Socioeconomic History   Marital status: Legally Separated    Spouse name: Not on file   Number of children: 1   Years of education: college   Highest education level: Not on file  Occupational History    Comment: Disabled  Tobacco Use   Smoking status: Former    Current packs/day: 0.00    Average packs/day: 0.3 packs/day for 40.0 years (10.0 ttl pk-yrs)  Types: Cigarettes    Start date: 10/1981    Quit date: 10/2021    Years since quitting: 1.9    Passive exposure: Past   Smokeless tobacco: Former  Building services engineer status: Never Used  Substance and Sexual Activity   Alcohol use: Yes    Alcohol/week: 1.0 standard drink of alcohol    Types: 1 Standard drinks or equivalent per week    Comment: OCC   Drug  use: No   Sexual activity: Not Currently    Birth control/protection: Surgical    Comment: Hysterectomy  Other Topics Concern   Not on file  Social History Narrative   ** Merged History Encounter **       Patient is separated  and lives at home with her friend. Patient is disabled. Education one year of college Right handed Caffeine two cups daily.     Social Drivers of Corporate investment banker Strain: Not on file  Food Insecurity: No Food Insecurity (09/22/2023)   Hunger Vital Sign    Worried About Running Out of Food in the Last Year: Never true    Ran Out of Food in the Last Year: Never true  Transportation Needs: No Transportation Needs (09/22/2023)   PRAPARE - Administrator, Civil Service (Medical): No    Lack of Transportation (Non-Medical): No  Physical Activity: Not on file  Stress: Not on file  Social Connections: Not on file  Intimate Partner Violence: Not At Risk (09/22/2023)   Humiliation, Afraid, Rape, and Kick questionnaire    Fear of Current or Ex-Partner: No    Emotionally Abused: No    Physically Abused: No    Sexually Abused: No     Review of Systems    General:  No chills, fever, night sweats or weight changes.  Cardiovascular:  No chest pain, dyspnea on exertion, edema, orthopnea, palpitations, paroxysmal nocturnal dyspnea. Dermatological: No rash, lesions/masses Respiratory: No cough, dyspnea Urologic: No hematuria, dysuria Abdominal:   No nausea, vomiting, diarrhea, bright red blood per rectum, melena, or hematemesis Neurologic:  No visual changes, wkns, changes in mental status. All other systems reviewed and are otherwise negative except as noted above.  Physical Exam    VS:  There were no vitals taken for this visit. , BMI There is no height or weight on file to calculate BMI. GEN: Well nourished, well developed, in no acute distress. HEENT: normal. Neck: Supple, no JVD, carotid bruits, or masses. Cardiac: RRR, no murmurs,  rubs, or gallops. No clubbing, cyanosis, edema.  Radials/DP/PT 2+ and equal bilaterally.  Respiratory:  Respirations regular and unlabored, clear to auscultation bilaterally. GI: Soft, nontender, nondistended, BS + x 4. MS: no deformity or atrophy. Skin: warm and dry, no rash. Neuro:  Strength and sensation are intact. Psych: Normal affect.  Accessory Clinical Findings    Recent Labs: 07/17/2023: Magnesium 2.0 09/10/2023: TSH 13.623 09/22/2023: ALT 12; B Natriuretic Peptide 2,297.0; Hemoglobin 11.9; Platelets 292 09/23/2023: BUN 20; Creatinine, Ser 3.38; Potassium 3.6; Sodium 135   Recent Lipid Panel    Component Value Date/Time   CHOL 131 07/06/2023 0821   CHOL 188 12/23/2013 1132   TRIG 213 (H) 07/06/2023 0821   HDL 25 (L) 07/06/2023 0821   HDL 48 12/23/2013 1132   CHOLHDL 5.2 07/06/2023 0821   VLDL 43 (H) 07/06/2023 0821   LDLCALC 63 07/06/2023 0821   LDLCALC 119 (H) 12/23/2013 1132         ECG personally reviewed  by me today-none today.     Cardiac PET CT-06/05/2023   Findings are consistent with a small area of infarction with peri-infarct ischemia in the inferior wall. This study is high risk. Consider cardiac catheterization given reduced myocardial blood flow reserve, severe coronary calcifications, reduced EF with no augmentation with stress, and perfusion defect.   LV perfusion is abnormal. Defect 1: There is a small defect with mild reduction in uptake present in the apical to mid inferior location(s) that is partially reversible. There is abnormal wall motion in the defect area. Consistent with infarction and peri-infarct ischemia.   Rest left ventricular function is abnormal. Rest global function is severely reduced. Rest EF: 25%. Stress left ventricular function is abnormal. Stress global function is severely reduced. Stress EF: 28%. Ejection fraction may be inaccurately gated in the setting frequent ectopy. End diastolic cavity size is severely enlarged. End  systolic cavity size is severely enlarged. No evidence of transient ischemic dilation (TID) noted.   Myocardial blood flow was computed to be 0.43ml/g/min at rest and 1.33ml/g/min at stress. Global myocardial blood flow reserve was 1.54 and was abnormal. Challenging to accurately interpret MBFR in the setting of ESRD.   Coronary calcium was present on the attenuation correction CT images. Severe coronary calcifications were present. Coronary calcifications were present in the left anterior descending artery and left circumflex artery distribution(s).   Electronically signed by: Parke Poisson, MD   CLINICAL DATA:  This over-read does not include interpretation of cardiac or coronary anatomy or pathology. The Cardiac PET CT interpretation by the cardiologist is attached. Patient with known left lower lobe lung cancer. * Tracking Code: BO *   COMPARISON:  CT chest angiogram, 05/06/2023, CT chest, 11/09/2022   FINDINGS: Cardiovascular: Aortic atherosclerosis. Cardiomegaly. Left coronary artery calcifications. No pericardial effusion.   Limited Mediastinum/Nodes: Prevascular lymph node unchanged compared to recent prior CT, although significantly enlarged compared to prior examination dated 11/09/2022 measuring 1.9 x 1.3 cm (series 3, image 9) trachea and esophagus demonstrate no significant findings.   Limited Lungs/Pleura: Moderate left pleural effusion and near complete atelectasis or consolidation of the left lower lobe, similar to recent prior examination although markedly increased compared to more remote examination dated 11/09/2022. Trace right pleural effusion. Unchanged, partially imaged fat herniation or pleural lipoma of the anterior right hemithorax, unchanged compared to prior examination dated 11/09/2022   Upper Abdomen: No acute abnormality.   Musculoskeletal: No acute osseous findings. Subacute to chronic, partially callused bilateral anterior rib fractures.    IMPRESSION: 1. Moderate left pleural effusion and near complete atelectasis or consolidation of the left lower lobe, similar to recent prior examination although markedly increased compared to more remote examination dated 11/09/2022. This may reflect post treatment appearance of known malignancy; assessment for residual or recurrent disease very limited on this non tailored examination. Attention on oncologic follow-up. Note that metabolic activity of lung malignancy is not meaningfully assessed by rubidium PET-CT. 2. Prevascular lymph node unchanged compared to recent prior CT, although significantly enlarged compared to prior examination dated 11/09/2022 this is worrisome for nodal metastatic disease although may be reactive. 3. Cardiomegaly and coronary artery disease.   Aortic Atherosclerosis (ICD10-I70.0).     Electronically Signed   By: Jearld Lesch M.D.   On: 06/05/2023 15:12   Cardiac catheterization 07/05/2023     Prox Cx to Mid Cx lesion is 40% stenosed.   Mid Cx lesion is 30% stenosed.   Dist LAD lesion is 10% stenosed.   Recommend  Aspirin 81mg  daily for moderate CAD.   There is significant coronary calcification involving the LAD and left circumflex vessel.    Nonobstructive CAD with smooth 10% mid LAD narrowings and left circumflex calcification with 40% proximal stenosis and smooth 30% mid AV groove stenosis.   Normal nondominant RCA.   RECOMMENDATION: Medical therapy for mild nonobstructive CAD.  Guideline directed therapy with reduced LV function in this patient with end-stage renal disease.  Since the patient does not have anyone at home, she will stay overnight for observation.  Plans are for dialysis tomorrow and apparently it has been arranged that she would be picked up to leave the hospital in early a.m.and taken for her dialysis.  Diagnostic Dominance: Right  Intervention  Assessment & Plan   1.  ***  Thomasene Ripple. Lela Murfin NP-C     09/24/2023,  8:26 AM [facility] [address] Office [phone] Fax [phone]    I spent 14*** minutes examining this patient, reviewing medications, and using patient centered shared decision making involving her cardiac care.  Prior to her visit I spent greater than 20 minutes reviewing her past medical history,  medications, and prior cardiac tests.

## 2023-10-09 NOTE — TOC Progression Note (Signed)
Transition of Care [facility]) - Progression Note    Patient Details  Name: [name] [name] MRN: [id number] Date of Birth: 11-13-1950  Transition of Care [facility]) CM/SW Contact  Lockie Pares, RN Phone Number: 09/16/2023, 5:23 PM  Clinical Narrative:      Son called this RNCM to discuss POA again. As his mother is not coherent, she cannot make him POA. This defaults to husband, who is willing to make decisions per MD. This was told to the son as well        Expected Discharge Plan and Services       Living arrangements for the past 2 months: Single Family Home                                       Social Determinants of Health (SDOH) Interventions SDOH Screenings   Food Insecurity: No Food Insecurity (09/22/2023)  Housing: Low Risk  (09/23/2023)  Transportation Needs: No Transportation Needs (09/22/2023)  Utilities: Not At Risk (09/22/2023)  Tobacco Use: Medium Risk (09/22/2023)    Readmission Risk Interventions    08/21/2023    3:23 PM 05/09/2023    3:37 PM 05/08/2023   12:17 PM  Readmission Risk Prevention Plan  Transportation Screening Complete Complete Complete  Medication Review (RN Care Manager) Referral to Pharmacy Complete Complete  PCP or Specialist appointment within 3-5 days of discharge Complete Complete Complete  HRI or Home Care Consult Complete Complete Complete  SW Recovery Care/Counseling Consult Complete Complete Complete  Palliative Care Screening Not Applicable Not Applicable Not Applicable  Skilled Nursing Facility Not Applicable Complete Complete

## 2023-10-09 NOTE — Progress Notes (Signed)
[facility] Progress Note   Subjective:   Seen in room - awake, on BiPAP. HD was shortened yesterday due to hypotension - got 2L off.   Objective Vitals:   09/13/2023 0555 09/25/2023 0745 10/01/2023 0755 10/01/2023 0800  BP: (!) 147/73 (!) 164/74    Pulse: (!) 113 (!) 121 (!) 119   Resp: (!) 25 (!) 24 (!) 25   Temp:  97.9 F (36.6 C)    TempSrc:  Oral    SpO2: 93% 93% 98% 98%  Weight:      Height:       Physical Exam General: Chronically ill appearing, on BiPAP, awake/alert Heart: Tachycardic Lungs: CTA anteriorly, labored breathing Abdomen: soft Extremities: no LE edema Dialysis Access: LUE AVG + t/b  Additional Objective Labs: Basic Metabolic Panel: Recent Labs  Lab 09/24/23 2243 09/26/23 1128 09/18/2023 0500  NA 132* 130* 131*  K 3.9 4.9 4.7  CL 92* 90* 88*  CO2 29 22 23   GLUCOSE 161* 257* 203*  BUN 49* 52* 33*  CREATININE 5.58* 5.08* 3.32*  CALCIUM 9.8 10.8* 10.8*  PHOS 5.6*  --  3.4   Liver Function Tests: Recent Labs  Lab 09/22/23 1056 09/24/23 2243 09/26/23 1128 10/06/2023 0500  AST 20  --  33 50*  ALT 12  --  31 28  ALKPHOS 68  --  85 81  BILITOT 0.5  --  0.6 1.5*  PROT 7.0  --  7.7 7.8  ALBUMIN 3.4* 3.4* 3.6 3.8   CBC: Recent Labs  Lab 09/22/23 1056 09/22/23 1128 09/22/23 2251 09/26/23 1128 09/21/2023 0500  WBC 8.8  --   --  14.7* 13.6*  NEUTROABS 7.0  --   --   --  11.5*  HGB 9.3*   < > 11.9* 9.3* 9.3*  HCT 34.1*   < > 35.0* 32.2* 32.0*  MCV 111.8*  --   --  105.6* 103.2*  PLT 292  --   --  362 279   < > = values in this interval not displayed.   Studies/Results: DG CHEST PORT 1 VIEW Result Date: 09/26/2023 CLINICAL DATA:  Dyspnea EXAM: PORTABLE CHEST 1 VIEW COMPARISON:  09/23/2023, 09/22/2023, 09/11/2023 FINDINGS: Similar cardiac enlargement with vascular congestion. Probable left effusion with persistent left lung base airspace disease. No pneumothorax. IMPRESSION: Similar cardiac enlargement with vascular congestion.  Probable left effusion with persistent left lung base airspace disease. No significant change Electronically Signed   By: Jasmine Pang M.D.   On: 09/26/2023 19:17   Medications:   apixaban  5 mg Oral BID   Chlorhexidine Gluconate Cloth  6 each Topical Q0600   clopidogrel  75 mg Oral Daily   colchicine  0.3 mg Oral Daily   fluticasone furoate-vilanterol  1 puff Inhalation Daily   insulin aspart  0-5 Units Subcutaneous QHS   insulin aspart  0-6 Units Subcutaneous TID WC   levothyroxine  175 mcg Oral Q0600   midodrine  15 mg Oral Q8H   polyethylene glycol  17 g Oral Daily    Dialysis Orders: TTS - [facility] 4hr, 400/600, EDW 96.5kg, 2K/2Ca bath, LUE AVG, hepariin 2000 initial + 2000 unit mid-run bolus   Assessment/Plan: Acute on chronic hypoxic respiratory failure: Recurrent admits for overload/dyspnea requiring BiPAP which improves with serial HD. S/p HD 12/15, 12/17, 12/18, 12/19. Still unable to be liberated from BiPAP. ?Consider thoracentesis. Hypotension: BP higher 12/19, midodrine 15mg  TID held and then BP dropped during HD. BP higher today. Will plan to  use midodrine pre-HD. Hx LLL lung cancer s/p "curative" radiation, now with concern for recurrence per last oncology note - was supposed to have a PET scan 12/20, missing this. Consider weighing in with her oncologist. If cancer is back, then this would be helpful information for GOC discussions. ESRD: Usual TTS schedule - see above. Issues cannulating AVG yesterday but with strong bruit. Our census is high today - unclear if can do another HD today, next HD tomorrow. HTN: Prev on metoprolol - on hold, no LE edema but CXR with persistent pulm edema. Now well below prior EDW. Anemia of ESRD: Hgb 9.3, no ESA for now. Secondary HPTH: CorrCa high, no VDRA for now. Phos better today, no binders. HFrEF (EF 20-25% in 09/2023) T1DM A-fib: On Eliquis GOC: Still on BiPAP despite ~10kg weight loss over few days.      Ozzie Hoyle,  PA-C 09/25/2023, 11:39 AM  [employer]

## 2023-10-09 NOTE — Evaluation (Signed)
Physical Therapy Evaluation Patient Details Name: [name] [name] MRN: [id number] DOB: 1951/08/30 Today's Date: 09/10/2023  History of Present Illness  Pt is a 73 y.o. female who presented 09/22/23 with SOB. Pt with volume overload and was not utilizing home O2/BiPAP. Pt has had recurrent hospitalizations for hypoxemia and respiratory distress, fluid overload. 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   Clinical Impression  Pt presents with condition above and deficits mentioned below, see PT Problem List. PTA, she was living with her son in a 1-level apartment with a ramped entrance. Per chart review, pt typically requires a hoyer lift to transfer OOB and assistance to roll in bed. Currently, pt is minimally verbal (only mumbling intermittently) and very SOB. She needed repeated cues to breathe in through her nose and not her mouth. See SpO2 measurements in General Comments below. She demonstrates deficits in balance, strength, power, endurance, and possibly cognition. At this time, she is requiring maxA for bed mobility and mod-maxA to sit EOB briefly. If son cannot provide the level of care she needs, she could benefit from inpatient rehab, < 3 hours/day. Will continue to follow acutely.      If plan is discharge home, recommend the following: Two people to help with walking and/or transfers;Two people to help with bathing/dressing/bathroom;Assistance with cooking/housework;Direct supervision/assist for financial management;Direct supervision/assist for medications management;Assist for transportation;Help with stairs or ramp for entrance   Can travel by private vehicle   No    Equipment Recommendations None recommended by PT   Recommendations for Other Services  OT consult    Functional Status Assessment Patient has had a recent decline in their functional status and demonstrates the ability to make significant improvements in function in a reasonable and predictable amount of time.     Precautions / Restrictions Precautions Precautions: Fall Precaution Comments: watch SpO2 Restrictions Weight Bearing Restrictions Per Provider Order: No      Mobility  Bed Mobility Overal bed mobility: Needs Assistance Bed Mobility: Rolling, Supine to Sit, Sit to Supine Rolling: Max assist, Used rails   Supine to sit: Max assist, HOB elevated Sit to supine: Max assist, HOB elevated   General bed mobility comments: Cues provided for pt to bring each leg towards EOB, maxA to lift trunk and scoot hips to EOB to sit up. MaxA to manage trunk and legs back to supine. MaxA to rotate trunk to roll with pt holding onto bed rail to assist    Transfers                   General transfer comment: deferred due to SpO2 dropping in sitting    Ambulation/Gait               General Gait Details: deferred  Stairs            Wheelchair Mobility     Tilt Bed    Modified Rankin (Stroke Patients Only)       Balance Overall balance assessment: Needs assistance Sitting-balance support: Bilateral upper extremity supported, Feet supported Sitting balance-Leahy Scale: Poor Sitting balance - Comments: Mod-maxA for static sitting balance EOB, able to sit several minutes with SpO2 improving up to 92-94% before it then dropped to  as low as 80%, returned pt to supine immediately upon this drop in sats; posterior lean noted Postural control: Posterior lean     Standing balance comment: deferred                             Pertinent Vitals/Pain Pain Assessment Pain Assessment: Faces Faces Pain Scale: Hurts little more Pain Location: generalized with mobility Pain Descriptors / Indicators:  Discomfort, Grimacing, Guarding Pain Intervention(s): Limited activity within patient's tolerance, Monitored during session, Repositioned    Home Living Family/patient expects to be discharged to:: Private residence Living Arrangements: Children (son) Available Help at Discharge: Family;Personal care attendant (son s/p back surgery and home 24/7) Type of Home: Apartment Home Access: Ramped entrance       Home Layout: One level Home Equipment: Tub bench;Grab bars - tub/shower;Grab bars - toilet;Rolling Walker (2 wheels);BSC/3in1;Hospital bed;Wheelchair - Engineer, technical sales - power;Other (comment) (hoyer lift) Additional Comments: info carried over from entry 09/11/23 as pt was minimally verbal and no family present at this time    Prior Function Prior Level of Function : Needs assist (info carried over from entry 09/11/23 as pt was minimally verbal and no family present at this time)       Physical Assist : Mobility (physical);ADLs (physical) Mobility (physical): Bed mobility;Transfers;Gait ADLs (physical): Bathing;Dressing;Toileting;IADLs Mobility Comments: hoyer for OOB, son assists with rolling ADLs Comments: PCA helps with ADLs and IADLs. PCA 7 days/week Sun, MWF 3 hours. 2 hours T,R, Sat. aid uses hoyer to shower bench 1-2x/week     Extremity/Trunk Assessment   Upper Extremity Assessment Upper Extremity Assessment: Defer to OT evaluation    Lower Extremity Assessment Lower Extremity Assessment: Generalized weakness    Cervical / Trunk Assessment Cervical / Trunk Assessment: Kyphotic;Other exceptions (increased body habitus)  Communication   Communication Communication: Difficulty communicating thoughts/reduced clarity of speech;Difficulty following commands/understanding Following commands: Follows one step commands inconsistently;Follows one step commands with increased time Cueing Techniques: Verbal cues;Tactile cues  Cognition Arousal: Alert Behavior During Therapy:  Flat affect Overall Cognitive Status: Difficult to assess                                 General Comments: Pt minimally verbal, only mumbling a couple times but unable to understand her. Pt seemed to be more focused on just trying to catch her breath. Follows simple cues ~75% of time with slow processing and initiation.        General Comments General comments (skin integrity, edema, etc.): SpO2 >/= 86% on 8L HFNC upon arrival, RN permitting PT, SpO2 improved to 92-94% on 10L upon initially sitting EOB but after several minutes it then declined to as low as 80%, pt immediately returned to supine, after scooting pt superiorly in bed and adjusting bed features for [facility] to be elevated some her SpO2 dropped to as low as 70% but then continued to rebound to the low 80s% on 12L, RN aware and called RT to assist; notified NT to start rolling schedule if they had not already    Exercises     Assessment/Plan    PT Assessment Patient needs continued PT services  PT Problem List Decreased strength;Decreased activity tolerance;Decreased balance;Decreased mobility;Decreased cognition;Cardiopulmonary status limiting activity;Obesity       PT Treatment Interventions DME instruction;Gait training;Functional mobility training;Therapeutic activities;Patient/family education;Balance training;Therapeutic exercise;Neuromuscular re-education;Cognitive remediation;Manual techniques    PT Goals (Current goals can  be found in the Care Plan section)  Acute Rehab PT Goals Patient Stated Goal: did not state PT Goal Formulation: Patient unable to participate in goal setting Time For Goal Achievement: 10/11/23 Potential to Achieve Goals: Fair    Frequency Min 1X/week     Co-evaluation               AM-PAC PT "6 Clicks" Mobility  Outcome Measure Help needed turning from your back to your side while in a flat bed without using bedrails?: A Lot Help needed moving from lying on your back to  sitting on the side of a flat bed without using bedrails?: A Lot Help needed moving to and from a bed to a chair (including a wheelchair)?: Total Help needed standing up from a chair using your arms (e.g., wheelchair or bedside chair)?: Total Help needed to walk in hospital room?: Total Help needed climbing 3-5 steps with a railing? : Total 6 Click Score: 8    End of Session Equipment Utilized During Treatment: Oxygen Activity Tolerance: Treatment limited secondary to medical complications (Comment) (desatting) Patient left: in bed;with call bell/phone within reach;with bed alarm set;with nursing/sitter in room Nurse Communication: Mobility status;Other (comment) (sats) PT Visit Diagnosis: Muscle weakness (generalized) (M62.81);Other (comment) (decreased endurance and pulmonary function)    Time: 1539-1600 PT Time Calculation (min) (ACUTE ONLY): 21 min   Charges:   PT Evaluation $PT Eval Moderate Complexity: 1 Mod   PT General Charges $$ ACUTE PT VISIT: 1 Visit         Virgil Benedict, PT, DPT Acute Rehabilitation Services  Office: [phone]   Bettina Gavia 09/26/2023, 4:58 PM

## 2023-10-09 NOTE — Death Summary Note (Addendum)
DEATH SUMMARY   Patient Details  Name: [name] [name] MRN: [id number] DOB: [birth year] WUJ:[id number], [name], FNP Admission/Discharge Information   Admit Date:  10-18-2023  Date of Death:    Time of Death:    Length of Stay: 5   Principle Cause of death: acute on chronic hypercarbic respiratory failure  Hospital Diagnoses: Principal Problem:   Acute respiratory failure with hypoxia and hypercarbia (HCC) Active Problems:   Respiratory failure (HCC)   ESRD (end stage renal disease) (HCC)   CAD (coronary artery disease)   Paroxysmal atrial fibrillation (HCC)   Diabetes (HCC)   Volume overload   Obesity (BMI 30-39.9)   HFrEF (heart failure with reduced ejection fraction) [facility])   Hospital Course:  73 year old female with ESRD on hemodialysis, COPD, type 2 diabetes on insulin, hypertension, hypothyroidism, hyperlipidemia, sleep apnea not using CPAP currently with recurrent hospitalizations for hypoxemia and respiratory distress, fluid overload presented back to the emergency room with recurrence of worsening respiratory distress at home. Reportedly, she was not wearing oxygen/ BIPAP at home.  When she was brought to the emergency room she was hemodynamically stable.  Her VBG showed respiratory acidosis and she had increased wob on exam.  Her CXR showed findings consistent with heart failure as well as Matti Minney left base consolidation and effusion.  She was started on BiPAP and admitted to the hospital. Renal was consulted for her ESRD and dialysis.  She's had Shaquila Sigman complicated hospital course with multiple episodes of CO2 retention. She's received dialysis and bipap, but has continued to have issues with hypercarbic respiratory failure.  When I assumed care on 12/19, she was encephalopathic, unable to speak, acutely ill appearing, hypercarbic requiring bipap.  There have been issues with adherence with bipap at times.  After remaining on bipap overnight on 12/19-12/20 her respiratory acidosis had  improved on blood gas, but by the time I saw her in the room, she was encephalopathic again with VBG showing respiratory acidosis and required resumption of bipap.  Unfortunately, she brady'd down and passed away on the afternoon of 12/20.  Called to update her husband - discussed with him on arrival and patient's niece Edwina over phone.    Assessment and Plan:  Ethics Patient and husband are separated.  Doesn't sound like there's been legal separation based on my discussion with family.  Technically husband should remain decision maker, confirmed with him over the phone.  Based on my discussion with him, he should be the healthcare decision maker and he implied he and Mrs. Fausnaugh have had conversations in the past regarding these issues.     Goals of Care DNR.  Discussed with husband today her critical illness and had planned to discuss again after he arrived.  Unfortunately, she passed away prior to this [name] being able to have additional goals of care conversations.    Acute on Chronic Hypercarbic Respiratory Failure Obesity Hypoventilation Syndrome Volume Overload  Issues with compliance/adherence with bipap throughout this hospitalization.  Today she was pulling off her bipap at times and per discussion with RN, she was on/off during the day as Zohra Clavel result.  Was last on bipap today around 3 PM.  Had planned for her to go back on, unfortunately she had brady event and passed away.  Blood gas improved this morning, but this was short lived with repeat blood gas around noon showing hypercarbic respiratory failure.  Seems to be bipap dependent based on this.  Had planned to have additional goals of care discussions  with family today based on this fact, but unfortunately she passed away before we had the opportunity. Management of volume per renal with dialysis, appreciate assistance Cxr with L effusion with persistent LL base airspace disease, cardiac enlargement with vascular congestion   Acute  Metabolic Encephalopathy Due to hypercarbic resp failure Delirium precautions   Leukocytosis Mild, afebrile Watch for other si/sx infection   ESRD Volume per renal   HFrEF  Pulmonary Hypertension Volume per renal EF 20-25%   Atrial Fibrillation Metoprolol, eliquis   CAD S/p LHC cath -> nonobstructive CAD Plavix  T2DM SSI   Hypotension Midodrine   Hypothyroidism Synthroid    Obesity Body mass index is 34.41 kg/m.   General: ill appearing, mouth wide open Cardiovascular: tachycardic Lungs: scattered wheezing noted today - increased WOB, tachypneic Abdomen: Soft, nontender, nondistended  Neurological: encephalopathic, unable to speak, does shake her head and try to say something unintelligible Extremities: No clubbing or cyanosis. No edema.      Procedures: none  Consultations: renal  The results of significant diagnostics from this hospitalization (including imaging, microbiology, ancillary and laboratory) are listed below for reference.   Significant Diagnostic Studies: DG CHEST PORT 1 VIEW Result Date: 09/26/2023 CLINICAL DATA:  Dyspnea EXAM: PORTABLE CHEST 1 VIEW COMPARISON:  09/23/2023, 09/22/2023, 09/11/2023 FINDINGS: Similar cardiac enlargement with vascular congestion. Probable left effusion with persistent left lung base airspace disease. No pneumothorax. IMPRESSION: Similar cardiac enlargement with vascular congestion. Probable left effusion with persistent left lung base airspace disease. No significant change Electronically Signed   By: Jasmine Pang M.D.   On: 09/26/2023 19:17   DG CHEST PORT 1 VIEW Result Date: 09/23/2023 CLINICAL DATA:  Pneumonia. EXAM: PORTABLE CHEST 1 VIEW COMPARISON:  09/22/2023 FINDINGS: The cardio pericardial silhouette is enlarged. Similar left base collapse/consolidation with effusion. Vascular congestion again noted. Bones are diffusely demineralized. Telemetry leads overlie the chest. IMPRESSION: 1. Similar left base  collapse/consolidation with effusion. 2. Vascular congestion. Electronically Signed   By: Kennith Center M.D.   On: 09/23/2023 16:21   DG Chest Port 1 View Result Date: 09/22/2023 CLINICAL DATA:  SOB EXAM: PORTABLE CHEST 1 VIEW COMPARISON:  09/11/2023. FINDINGS: Cardiac silhouette is prominent. Left base consolidation and moderate effusion. Pulmonary vascular congestion with evidence of interstitial edema. No pneumothorax. Calcified aorta. IMPRESSION: Findings consistent with CHF. Left base consolidation and effusion. Electronically Signed   By: Layla Maw M.D.   On: 09/22/2023 11:25   DG Chest Port 1 View Result Date: 09/11/2023 CLINICAL DATA:  Shortness of breath.  Evaluate for pleural effusion. EXAM: PORTABLE CHEST 1 VIEW COMPARISON:  Radiographs 09/09/2023 and 08/24/2023.  CT 07/11/2023. FINDINGS: 0506 hours. Stable cardiomegaly and aortic atherosclerosis. Pulmonary vascular congestion and edema appear mildly improved. There is persistent retrocardiac airspace disease and Ilayda Toda probable small left pleural effusion. No evidence of significant right-sided pleural effusion or pneumothorax. The bones appear unchanged. Telemetry leads overlie the chest. IMPRESSION: Mildly improved pulmonary vascular congestion and edema. Persistent retrocardiac airspace disease and probable small left pleural effusion. Electronically Signed   By: Carey Bullocks M.D.   On: 09/11/2023 08:56   ECHOCARDIOGRAM LIMITED Result Date: 09/10/2023    ECHOCARDIOGRAM LIMITED REPORT   Patient Name:   EULINE MCCORY Date of Exam: 09/10/2023 Medical Rec #:  [id number]         Height:       63.0 in Accession #:    [id number]        Weight:       212.5  lb Date of Birth:  1951-04-26          BSA:          1.984 m Patient Age:    72 years          BP:           121/53 mmHg Patient Gender: F                 HR:           66 bpm. Exam Location:  Inpatient Procedure: Limited Echo, Cardiac Doppler, Color Doppler and Intracardiac             Opacification Agent Indications:    CHF Acute Systolic  History:        Patient has prior history of Echocardiogram examinations, most                 recent 04/15/2023. Risk Factors:Diabetes, Dyslipidemia,                 Hypertension, Sleep Apnea and Former Smoker.  Sonographer:    Karma Ganja Referring Phys: [id number] CAROLE N HALL  Sonographer Comments: Technically difficult study due to poor echo windows and patient is obese. Image acquisition challenging due to patient body habitus and Image acquisition challenging due to patient behavioral factors. IMPRESSIONS  1. No LV thrombus noted. Left ventricular ejection fraction, by estimation, is 20 to 25%. The left ventricle has severely decreased function. The left ventricle demonstrates global hypokinesis. The left ventricular internal cavity size was mildly dilated. There is mild concentric left ventricular hypertrophy.  2. Left atrial size was severely dilated.  3. Moderate pleural effusion in the left lateral region.  4. The mitral valve was not well visualized. Trivial mitral valve regurgitation. No evidence of mitral stenosis.  5. The aortic valve was not well visualized.  6. There is severely elevated pulmonary artery systolic pressure. The estimated right ventricular systolic pressure is 81.3 mmHg.  7. The inferior vena cava is dilated in size with <50% respiratory variability, suggesting right atrial pressure of 15 mmHg. Comparison(s): Unable to view comparison study, LVEF is worse that reported. FINDINGS  Left Ventricle: No LV thrombus noted. Left ventricular ejection fraction, by estimation, is 20 to 25%. The left ventricle has severely decreased function. The left ventricle demonstrates global hypokinesis. Definity contrast agent was given IV to delineate the left ventricular endocardial borders. The left ventricular internal cavity size was mildly dilated. There is mild concentric left ventricular hypertrophy. Right Ventricle: There is severely elevated  pulmonary artery systolic pressure. The tricuspid regurgitant velocity is 4.07 m/s, and with an assumed right atrial pressure of 15 mmHg, the estimated right ventricular systolic pressure is 81.3 mmHg. Left Atrium: Left atrial size was severely dilated. Mitral Valve: The mitral valve was not well visualized. Trivial mitral valve regurgitation. No evidence of mitral valve stenosis. Aortic Valve: The aortic valve was not well visualized. Pulmonic Valve: The pulmonic valve was not well visualized. Pulmonic valve regurgitation is mild. No evidence of pulmonic stenosis. Venous: The inferior vena cava is dilated in size with less than 50% respiratory variability, suggesting right atrial pressure of 15 mmHg. Additional Comments: There is Azana Kiesler moderate pleural effusion in the left lateral region. Spectral Doppler performed. Color Doppler performed.  LEFT VENTRICLE PLAX 2D LVIDd:         5.90 cm LVIDs:         5.20 cm LV PW:         1.30  cm LV IVS:        1.30 cm LVOT diam:     2.10 cm LVOT Area:     3.46 cm  LV Volumes (MOD) LV vol d, MOD A2C: 95.5 ml LV vol d, MOD A4C: 157.0 ml LV vol s, MOD A2C: 88.8 ml LV vol s, MOD A4C: 129.0 ml LV SV MOD A2C:     6.7 ml LV SV MOD A4C:     157.0 ml LV SV MOD BP:      12.0 ml IVC IVC diam: 2.50 cm LEFT ATRIUM              Index LA diam:        4.40 cm  2.22 cm/m LA Vol (A2C):   119.0 ml 59.97 ml/m LA Vol (A4C):   108.0 ml 54.43 ml/m LA Biplane Vol: 117.0 ml 58.97 ml/m  TRICUSPID VALVE TR Peak grad:   66.3 mmHg TR Vmax:        407.00 cm/s  SHUNTS Systemic Diam: 2.10 cm Riley Lam MD Electronically signed by Riley Lam MD Signature Date/Time: 09/10/2023/1:57:55 PM    Final    DG Chest Port 1 View Result Date: 09/09/2023 CLINICAL DATA:  Shortness of breath. EXAM: PORTABLE CHEST 1 VIEW COMPARISON:  August 24, 2023 FINDINGS: The right-sided venous catheter seen on the prior study has been removed. There is stable moderate severity cardiac silhouette enlargement. Mild  diffusely increased interstitial lung markings are seen with mild to moderate severity prominence of the central pulmonary vasculature. Moderate to marked severity left basilar atelectasis and/or infiltrate is noted. There is [place] small to moderate size left pleural effusion. No pneumothorax is identified. Multilevel degenerative changes seen throughout the thoracic spine. IMPRESSION: 1. Cardiomegaly with mild to moderate severity pulmonary vascular congestion. 2. Moderate to marked severity left basilar atelectasis and/or infiltrate. 3. Small to moderate size left pleural effusion. Electronically Signed   By: Aram Candela M.D.   On: 09/09/2023 23:56   IR Removal Tun Cv Cath W/O FL Result Date: 08/29/2023 INDICATION: 73 year old with poor venous access s/p tunneled central venous catheter placement 08/22/23 by Dr. Milford Cage. Patient has completed therapy, request made for removal. EXAM: REMOVAL TUNNELED CENTRAL VENOUS CATHETER MEDICATIONS: None ANESTHESIA/SEDATION: None FLUOROSCOPY: None COMPLICATIONS: None immediate. PROCEDURE: Informed written consent was obtained from the patient after Arshia Spellman thorough discussion of the procedural risks, benefits and alternatives. All questions were addressed. Maximal Sterile Barrier Technique was utilized including caps, mask, sterile gowns, sterile gloves, sterile drape, hand hygiene and skin antiseptic. Ajahni Nay timeout was performed prior to the initiation of the procedure. The patient's right chest and catheter was prepped and draped in Martisha Toulouse normal sterile fashion. Using gentle blunt dissection the cuff of the catheter was exposed and the catheter was removed in it's entirety. Pressure was held till hemostasis was obtained. Gudelia Eugene sterile dressing was applied. The patient tolerated the procedure well with no immediate complications. IMPRESSION: Successful catheter removal as described above. Performed by: Loyce Dys PA-C Electronically Signed   By: Richarda Overlie M.D.   On: 08/29/2023 15:43     Microbiology: Recent Results (from the past 240 hours)  MRSA Next Gen by PCR, Nasal     Status: None   Collection Time: 09/23/23  2:08 AM   Specimen: Nasal Mucosa; Nasal Swab  Result Value Ref Range Status   MRSA by PCR Next Gen NOT DETECTED NOT DETECTED Final    Comment: (NOTE) The GeneXpert MRSA Assay (FDA approved for NASAL  specimens only), is one component of Cniyah Sproull comprehensive MRSA colonization surveillance program. It is not intended to diagnose MRSA infection nor to guide or monitor treatment for MRSA infections. Test performance is not FDA approved in patients less than 31 years old. Performed at [facility], [address], [place], [place] [address]     Time spent: 60 minutes  Signed: Lacretia Nicks, MD 09/24/2023

## 2023-10-09 DEATH — deceased

## 2024-11-10 NOTE — Telephone Encounter (Signed)
 error
